# Patient Record
Sex: Male | Born: 1940 | Race: White | Hispanic: No | Marital: Married | Smoking: Former smoker
Health system: Southern US, Community
[De-identification: ages and names within clinical notes are randomized; demographics above are authoritative.]

## PROBLEM LIST (undated history)

## (undated) ENCOUNTER — Emergency Department (HOSPITAL_COMMUNITY): Payer: Medicare HMO | Source: Home / Self Care

## (undated) DIAGNOSIS — K567 Ileus, unspecified: Secondary | ICD-10-CM

## (undated) DIAGNOSIS — Z8719 Personal history of other diseases of the digestive system: Secondary | ICD-10-CM

## (undated) DIAGNOSIS — E039 Hypothyroidism, unspecified: Secondary | ICD-10-CM

## (undated) DIAGNOSIS — K859 Acute pancreatitis without necrosis or infection, unspecified: Secondary | ICD-10-CM

## (undated) DIAGNOSIS — R55 Syncope and collapse: Secondary | ICD-10-CM

## (undated) DIAGNOSIS — C801 Malignant (primary) neoplasm, unspecified: Secondary | ICD-10-CM

## (undated) DIAGNOSIS — R578 Other shock: Secondary | ICD-10-CM

## (undated) DIAGNOSIS — D122 Benign neoplasm of ascending colon: Secondary | ICD-10-CM

## (undated) DIAGNOSIS — G4733 Obstructive sleep apnea (adult) (pediatric): Secondary | ICD-10-CM

## (undated) DIAGNOSIS — K219 Gastro-esophageal reflux disease without esophagitis: Secondary | ICD-10-CM

## (undated) DIAGNOSIS — K921 Melena: Secondary | ICD-10-CM

## (undated) DIAGNOSIS — L719 Rosacea, unspecified: Secondary | ICD-10-CM

## (undated) DIAGNOSIS — I6529 Occlusion and stenosis of unspecified carotid artery: Secondary | ICD-10-CM

## (undated) DIAGNOSIS — I10 Essential (primary) hypertension: Secondary | ICD-10-CM

## (undated) DIAGNOSIS — Z8619 Personal history of other infectious and parasitic diseases: Secondary | ICD-10-CM

## (undated) DIAGNOSIS — Z87442 Personal history of urinary calculi: Secondary | ICD-10-CM

## (undated) DIAGNOSIS — I6523 Occlusion and stenosis of bilateral carotid arteries: Secondary | ICD-10-CM

## (undated) DIAGNOSIS — E785 Hyperlipidemia, unspecified: Secondary | ICD-10-CM

## (undated) DIAGNOSIS — T8859XA Other complications of anesthesia, initial encounter: Secondary | ICD-10-CM

## (undated) DIAGNOSIS — D126 Benign neoplasm of colon, unspecified: Secondary | ICD-10-CM

## (undated) DIAGNOSIS — T4145XA Adverse effect of unspecified anesthetic, initial encounter: Secondary | ICD-10-CM

## (undated) DIAGNOSIS — Z9889 Other specified postprocedural states: Secondary | ICD-10-CM

## (undated) DIAGNOSIS — M1990 Unspecified osteoarthritis, unspecified site: Secondary | ICD-10-CM

## (undated) DIAGNOSIS — J449 Chronic obstructive pulmonary disease, unspecified: Secondary | ICD-10-CM

## (undated) DIAGNOSIS — R112 Nausea with vomiting, unspecified: Secondary | ICD-10-CM

## (undated) DIAGNOSIS — H35073 Retinal telangiectasis, bilateral: Secondary | ICD-10-CM

## (undated) HISTORY — DX: Obstructive sleep apnea (adult) (pediatric): G47.33

## (undated) HISTORY — DX: Occlusion and stenosis of unspecified carotid artery: I65.29

## (undated) HISTORY — DX: Melena: K92.1

## (undated) HISTORY — DX: Rosacea, unspecified: L71.9

## (undated) HISTORY — DX: Occlusion and stenosis of bilateral carotid arteries: I65.23

## (undated) HISTORY — DX: Hyperlipidemia, unspecified: E78.5

## (undated) HISTORY — PX: OTHER SURGICAL HISTORY: SHX169

## (undated) HISTORY — PX: EYE SURGERY: SHX253

## (undated) HISTORY — DX: Gastro-esophageal reflux disease without esophagitis: K21.9

## (undated) HISTORY — DX: Acute pancreatitis without necrosis or infection, unspecified: K85.90

## (undated) HISTORY — DX: Personal history of other infectious and parasitic diseases: Z86.19

## (undated) HISTORY — DX: Benign neoplasm of colon, unspecified: D12.6

## (undated) HISTORY — DX: Ileus, unspecified: K56.7

## (undated) HISTORY — PX: LITHOTRIPSY: SUR834

## (undated) HISTORY — DX: Benign neoplasm of ascending colon: D12.2

## (undated) HISTORY — DX: Personal history of other diseases of the digestive system: Z87.19

## (undated) HISTORY — DX: Syncope and collapse: R55

## (undated) HISTORY — DX: Unspecified osteoarthritis, unspecified site: M19.90

## (undated) HISTORY — DX: Other shock: R57.8

## (undated) HISTORY — PX: TONSILLECTOMY: SUR1361

## (undated) HISTORY — PX: SKIN CANCER DESTRUCTION: SHX778

## (undated) HISTORY — PX: REFRACTIVE SURGERY: SHX103

## (undated) HISTORY — DX: Retinal telangiectasis, bilateral: H35.073

## (undated) HISTORY — DX: Essential (primary) hypertension: I10

## (undated) HISTORY — PX: ESOPHAGEAL DILATION: SHX303

## (undated) HISTORY — PX: TOTAL HIP ARTHROPLASTY: SHX124

---

## 1973-09-11 HISTORY — PX: NASAL SEPTUM SURGERY: SHX37

## 1989-09-11 HISTORY — PX: URETHRAL DILATION: SUR417

## 1993-09-11 HISTORY — PX: KNEE SURGERY: SHX244

## 1993-12-21 HISTORY — PX: HIATAL HERNIA REPAIR: SHX195

## 1999-09-12 DIAGNOSIS — R69 Illness, unspecified: Secondary | ICD-10-CM

## 1999-09-12 HISTORY — DX: Illness, unspecified: R69

## 2000-07-24 ENCOUNTER — Encounter: Payer: Self-pay | Admitting: Specialist

## 2000-07-30 ENCOUNTER — Inpatient Hospital Stay (HOSPITAL_COMMUNITY): Admission: RE | Admit: 2000-07-30 | Discharge: 2000-08-04 | Payer: Self-pay | Admitting: Specialist

## 2000-08-02 ENCOUNTER — Encounter: Payer: Self-pay | Admitting: Orthopedic Surgery

## 2001-05-07 ENCOUNTER — Inpatient Hospital Stay (HOSPITAL_COMMUNITY): Admission: EM | Admit: 2001-05-07 | Discharge: 2001-05-08 | Payer: Self-pay | Admitting: Emergency Medicine

## 2001-05-07 HISTORY — PX: BROW LIFT: SHX178

## 2001-05-08 ENCOUNTER — Encounter: Payer: Self-pay | Admitting: *Deleted

## 2001-06-13 ENCOUNTER — Encounter: Admission: RE | Admit: 2001-06-13 | Discharge: 2001-06-13 | Payer: Self-pay | Admitting: *Deleted

## 2001-06-13 ENCOUNTER — Encounter: Payer: Self-pay | Admitting: *Deleted

## 2001-10-29 ENCOUNTER — Encounter: Payer: Self-pay | Admitting: *Deleted

## 2001-10-29 ENCOUNTER — Encounter: Admission: RE | Admit: 2001-10-29 | Discharge: 2001-10-29 | Payer: Self-pay | Admitting: *Deleted

## 2001-10-31 ENCOUNTER — Encounter: Payer: Self-pay | Admitting: *Deleted

## 2001-10-31 ENCOUNTER — Ambulatory Visit (HOSPITAL_BASED_OUTPATIENT_CLINIC_OR_DEPARTMENT_OTHER): Admission: RE | Admit: 2001-10-31 | Discharge: 2001-10-31 | Payer: Self-pay | Admitting: *Deleted

## 2002-01-09 ENCOUNTER — Encounter: Payer: Self-pay | Admitting: *Deleted

## 2002-01-09 ENCOUNTER — Encounter: Admission: RE | Admit: 2002-01-09 | Discharge: 2002-01-09 | Payer: Self-pay | Admitting: *Deleted

## 2003-05-04 ENCOUNTER — Ambulatory Visit (HOSPITAL_COMMUNITY): Admission: RE | Admit: 2003-05-04 | Discharge: 2003-05-04 | Payer: Self-pay | Admitting: Gastroenterology

## 2003-11-04 ENCOUNTER — Emergency Department (HOSPITAL_COMMUNITY): Admission: EM | Admit: 2003-11-04 | Discharge: 2003-11-04 | Payer: Self-pay | Admitting: Emergency Medicine

## 2004-05-25 ENCOUNTER — Encounter: Admission: RE | Admit: 2004-05-25 | Discharge: 2004-05-25 | Payer: Self-pay | Admitting: General Surgery

## 2004-05-27 ENCOUNTER — Ambulatory Visit (HOSPITAL_COMMUNITY): Admission: RE | Admit: 2004-05-27 | Discharge: 2004-05-27 | Payer: Self-pay | Admitting: General Surgery

## 2004-05-27 ENCOUNTER — Ambulatory Visit (HOSPITAL_BASED_OUTPATIENT_CLINIC_OR_DEPARTMENT_OTHER): Admission: RE | Admit: 2004-05-27 | Discharge: 2004-05-27 | Payer: Self-pay | Admitting: General Surgery

## 2004-07-25 ENCOUNTER — Inpatient Hospital Stay (HOSPITAL_COMMUNITY): Admission: RE | Admit: 2004-07-25 | Discharge: 2004-07-29 | Payer: Self-pay | Admitting: Specialist

## 2004-07-25 HISTORY — PX: JOINT REPLACEMENT: SHX530

## 2004-08-26 ENCOUNTER — Emergency Department (HOSPITAL_COMMUNITY): Admission: EM | Admit: 2004-08-26 | Discharge: 2004-08-26 | Payer: Self-pay | Admitting: Emergency Medicine

## 2004-12-02 ENCOUNTER — Ambulatory Visit: Payer: Self-pay

## 2004-12-29 ENCOUNTER — Ambulatory Visit: Payer: Self-pay | Admitting: Cardiovascular Disease

## 2005-01-10 ENCOUNTER — Ambulatory Visit: Payer: Self-pay

## 2005-01-10 ENCOUNTER — Ambulatory Visit: Payer: Self-pay | Admitting: Internal Medicine

## 2005-01-26 ENCOUNTER — Ambulatory Visit: Payer: Self-pay | Admitting: Cardiovascular Disease

## 2005-03-06 ENCOUNTER — Ambulatory Visit: Payer: Self-pay | Admitting: Cardiovascular Disease

## 2005-06-08 ENCOUNTER — Ambulatory Visit: Payer: Self-pay | Admitting: Cardiovascular Disease

## 2005-11-24 ENCOUNTER — Ambulatory Visit: Payer: Self-pay | Admitting: Cardiovascular Disease

## 2006-08-01 ENCOUNTER — Inpatient Hospital Stay (HOSPITAL_COMMUNITY): Admission: EM | Admit: 2006-08-01 | Discharge: 2006-08-04 | Payer: Self-pay | Admitting: Emergency Medicine

## 2006-09-20 ENCOUNTER — Encounter (INDEPENDENT_AMBULATORY_CARE_PROVIDER_SITE_OTHER): Payer: Self-pay | Admitting: Specialist

## 2006-09-20 HISTORY — PX: LAPAROSCOPIC CHOLECYSTECTOMY: SUR755

## 2006-09-21 ENCOUNTER — Inpatient Hospital Stay (HOSPITAL_COMMUNITY): Admission: RE | Admit: 2006-09-21 | Discharge: 2006-09-26 | Payer: Self-pay | Admitting: General Surgery

## 2006-11-15 ENCOUNTER — Ambulatory Visit: Payer: Self-pay | Admitting: Cardiovascular Disease

## 2006-11-23 ENCOUNTER — Ambulatory Visit: Payer: Self-pay

## 2007-05-21 ENCOUNTER — Ambulatory Visit: Payer: Self-pay | Admitting: Cardiovascular Disease

## 2007-12-01 ENCOUNTER — Encounter: Admission: RE | Admit: 2007-12-01 | Discharge: 2007-12-01 | Payer: Self-pay | Admitting: Family Medicine

## 2008-03-17 ENCOUNTER — Encounter: Admission: RE | Admit: 2008-03-17 | Discharge: 2008-03-17 | Payer: Self-pay | Admitting: Family Medicine

## 2008-03-27 ENCOUNTER — Ambulatory Visit: Payer: Self-pay

## 2008-03-27 ENCOUNTER — Encounter (INDEPENDENT_AMBULATORY_CARE_PROVIDER_SITE_OTHER): Payer: Self-pay | Admitting: Family Medicine

## 2008-05-13 ENCOUNTER — Ambulatory Visit: Payer: Self-pay | Admitting: Cardiovascular Disease

## 2008-08-14 ENCOUNTER — Encounter: Admission: RE | Admit: 2008-08-14 | Discharge: 2008-08-14 | Payer: Self-pay | Admitting: Family Medicine

## 2009-02-09 ENCOUNTER — Encounter: Payer: Self-pay | Admitting: Cardiovascular Disease

## 2009-03-26 ENCOUNTER — Encounter: Admission: RE | Admit: 2009-03-26 | Discharge: 2009-03-26 | Payer: Self-pay | Admitting: Specialist

## 2009-05-13 DIAGNOSIS — L719 Rosacea, unspecified: Secondary | ICD-10-CM

## 2009-05-13 DIAGNOSIS — E7800 Pure hypercholesterolemia, unspecified: Secondary | ICD-10-CM | POA: Insufficient documentation

## 2009-05-13 DIAGNOSIS — R079 Chest pain, unspecified: Secondary | ICD-10-CM | POA: Insufficient documentation

## 2009-05-13 DIAGNOSIS — K449 Diaphragmatic hernia without obstruction or gangrene: Secondary | ICD-10-CM

## 2009-05-13 DIAGNOSIS — E039 Hypothyroidism, unspecified: Secondary | ICD-10-CM

## 2009-05-13 DIAGNOSIS — I10 Essential (primary) hypertension: Secondary | ICD-10-CM | POA: Insufficient documentation

## 2009-05-13 DIAGNOSIS — M109 Gout, unspecified: Secondary | ICD-10-CM

## 2009-05-13 DIAGNOSIS — K560 Paralytic ileus: Secondary | ICD-10-CM

## 2009-05-13 DIAGNOSIS — E785 Hyperlipidemia, unspecified: Secondary | ICD-10-CM

## 2009-05-13 DIAGNOSIS — N200 Calculus of kidney: Secondary | ICD-10-CM

## 2009-05-13 DIAGNOSIS — K4090 Unilateral inguinal hernia, without obstruction or gangrene, not specified as recurrent: Secondary | ICD-10-CM

## 2009-05-13 DIAGNOSIS — K859 Acute pancreatitis without necrosis or infection, unspecified: Secondary | ICD-10-CM | POA: Insufficient documentation

## 2009-05-13 HISTORY — DX: Gout, unspecified: M10.9

## 2009-05-13 HISTORY — DX: Essential (primary) hypertension: I10

## 2009-05-13 HISTORY — DX: Diaphragmatic hernia without obstruction or gangrene: K44.9

## 2009-05-13 HISTORY — DX: Calculus of kidney: N20.0

## 2009-05-13 HISTORY — DX: Unilateral inguinal hernia, without obstruction or gangrene, not specified as recurrent: K40.90

## 2009-05-18 ENCOUNTER — Ambulatory Visit: Payer: Self-pay | Admitting: Cardiovascular Disease

## 2009-12-17 DIAGNOSIS — D649 Anemia, unspecified: Secondary | ICD-10-CM | POA: Insufficient documentation

## 2009-12-17 HISTORY — DX: Anemia, unspecified: D64.9

## 2010-05-25 ENCOUNTER — Encounter: Payer: Self-pay | Admitting: Cardiovascular Disease

## 2010-06-13 DIAGNOSIS — M19049 Primary osteoarthritis, unspecified hand: Secondary | ICD-10-CM

## 2010-06-13 HISTORY — DX: Primary osteoarthritis, unspecified hand: M19.049

## 2010-06-15 ENCOUNTER — Encounter: Admission: RE | Admit: 2010-06-15 | Discharge: 2010-06-15 | Payer: Self-pay | Admitting: Family Medicine

## 2010-06-16 ENCOUNTER — Ambulatory Visit: Payer: Self-pay | Admitting: Cardiovascular Disease

## 2010-07-13 HISTORY — PX: JOINT REPLACEMENT: SHX530

## 2010-07-25 DIAGNOSIS — M1610 Unilateral primary osteoarthritis, unspecified hip: Secondary | ICD-10-CM

## 2010-07-25 HISTORY — DX: Unilateral primary osteoarthritis, unspecified hip: M16.10

## 2010-10-11 NOTE — Letter (Signed)
Summary: Self-Recorded Medical Hx  Self-Recorded Medical Hx   Imported By: Marylou Mccoy 06/29/2010 15:38:05  _____________________________________________________________________  External Attachment:    Type:   Image     Comment:   External Document

## 2010-10-11 NOTE — Assessment & Plan Note (Signed)
Summary: yearly/sl   CC:  sob.  History of Present Illness: [name] Pratt returns today for F/U of SSCP, HTN, and increased lipids.  He has no documented CAD with a normal myovue 3.14.2008.  EF 62%.  Echo 03/30/2008 done for dyspnea showed EF 60% with AV sclerosis and mild LAE.  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   He has been compliant with his BP meds and only takes gemfibrizol for triglycerides.  His labs are followed at [facility] r.  He has significant arthritis and sees Dr. Hardie Lora for hip and back issues.  These are relieved by Aleve.    Current Problems (verified): 1)  Hyperlipidemia  (ICD-272.4) 2)  Chest Pain  (ICD-786.50) 3)  Hypertension  (ICD-401.9) 4)  Nephrolithiasis  (ICD-592.0) 5)  Hiatal Hernia  (ICD-553.3) 6)  Ileus  (ICD-560.1) 7)  Inguinal Hernia  (ICD-550.90) 8)  Pancreatitis  (ICD-577.0) 9)  Hypothyroidism  (ICD-244.9) 10)  Hypercholesterolemia  (ICD-272.0) 11)  Rosacea  (ICD-695.3) 12)  Gout  (ICD-274.9)  Current Medications (verified): 1)  Allopurinol 100 Mg Tabs (Allopurinol) .[name].. 1 Tab By Mouth Once Daily 2)  Allopurinol 300 Mg Tabs (Allopurinol) .[name].. 1 Tab By Mouth Once Daily 3)  Amlodipine Besylate 10 Mg Tabs (Amlodipine Besylate) .... Take One Tablet By Mouth Daily 4)  Cephalexin 500 Mg Caps (Cephalexin) .... Prior Dental Visit 5)  Colchicine 0.6 Mg Tabs (Colchicine) .... As Needed 6)  Cozaar 100 Mg Tabs (Losartan Potassium) .... Take One Tablet By Mouth Daily 7)  Doxycycline Hyclate 100 Mg Solr (Doxycycline Hyclate) .... As Needed 8)  Gemfibrozil 600 Mg Tabs (Gemfibrozil) .[name].. 1 Tab By Mouth Two Times A Day 9)  Methyldopa 250 Mg Tabs (Methyldopa) .... Two Times A Day 10)  Metronidazole 0.75 % Crea (Metronidazole) .... As Needed 11)  Restasis 0.05 % Emul (Cyclosporine) .... As Directed 12)  Simvastatin 20 Mg Tabs (Simvastatin) .... Take One Tablet By Mouth Daily At Bedtime 13)  Hydrocodone-Acetaminophen 5-500 Mg Tabs (Hydrocodone-Acetaminophen) .... As Needed 14)   Ventolin Hfa 108 (90 Base) Mcg/act Aers (Albuterol Sulfate) .... As Needed 15)  Multivitamins   Tabs (Multiple Vitamin) .[name].. 1 Tab By Mouth Once Daily 16)  Ferrous Sulfate 325 (65 Fe) Mg  Tabs (Ferrous Sulfate) .[name].. 1 Tab By Mouth Once Daily 17)  Ra Omeprazole 20 Mg Tbec (Omeprazole) .[name].. 1 Tab By Mouth Once Daily  Allergies (verified): No Known Drug Allergies  Past History:  Past Medical History: Last updated: 05/13/2009 Current Problems:  HYPERLIPIDEMIA (ICD-272.4) CHEST PAIN (ICD-786.50) atypical with normal myovue 11/2006 HYPERTENSION (ICD-401.9) NEPHROLITHIASIS (ICD-592.0) HIATAL HERNIA (ICD-553.3) ILEUS (ICD-560.1) INGUINAL HERNIA (ICD-550.90) PANCREATITIS (ICD-577.0) HYPOTHYROIDISM (ICD-244.9) HYPERCHOLESTEROLEMIA (ICD-272.0) ROSACEA (ICD-695.3) GOUT (ICD-274.9) gallstone pancreatitis and ileus and prolonged hospitalization.   Cholecystitis, currently stable.  Past Surgical History: Last updated: 05/13/2009 Laparoscopic cholecystectomy with intraoperative cholangiogram and right inguinal herniorrhaphy with mesh September 20, 2006.  hiatal hernia surgery 15 years ago.  knee surgeries. Hemorrhoid surgery in the past. esophageal dilation for some reason several times performed by  Dr. Sharrell Ku.  This was done in 1993 and 1994.  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  I am not entirely clear why he had that done lithotripsy in 2003, the last one.  Family History: Last updated: 05/13/2009  Hypertension, cancer, and osteoarthritis.  Social History: Last updated: 05/18/2009  The patient is married.  He is a IT sales professional.  He does not  smoke and drink. 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  Review of Systems       Denies fever, malais, weight loss, blurry vision, decreased visual acuity, cough, sputum, SOB, hemoptysis, pleuritic pain, palpitaitons, heartburn, abdominal pain, melena, lower extremity edema,  claudication, or rash. CPAP recently changed and had sleep study  Vital Signs:  Patient profile:   70 year old male Height:      67 inches Weight:      174 pounds BMI:     27.35 Pulse rate:   59 / minute Resp:     14 per minute BP sitting:   148 / 76  (left arm)  Vitals Entered By: Kem Parkinson (June 16, 2010 2:46 PM)  Physical Exam  General:  Affect appropriate Healthy:  appears stated age HEENT: normal Neck supple with no adenopathy JVP normal no bruits no thyromegaly Lungs clear with no wheezing and good diaphragmatic motion Heart:  S1/S2 no murmur,rub, gallop or click PMI normal Abdomen: benighn, BS positve, no tenderness, no AAA no bruit.  No HSM or HJR Distal pulses intact with no bruits No edema Neuro non-focal Skin warm and dry    Impression & Recommendations:  Problem # 1:  CHEST TIGHTNESS-PRESSURE-OTHER ([id number]) Relsolved with normal ECG and prev. negative myovue  Problem # 2:  HYPERLIPIDEMIA (ICD-272.4) Assessment: Comment Only  Continue 2 drug Rx.  Labs per primary His updated medication list for this problem includes:    Gemfibrozil 600 Mg Tabs (Gemfibrozil) .[name]... 1 tab by mouth two times a day    Simvastatin 20 Mg Tabs (Simvastatin) .[name]... Take one tablet by mouth daily at bedtime  His updated medication list for this problem includes:    Gemfibrozil 600 Mg Tabs (Gemfibrozil) .[name]... 1 tab by mouth two times a day    Simvastatin 20 Mg Tabs (Simvastatin) .[name]... Take one tablet by mouth daily at bedtime  Problem # 3:  HYPERTENSION (ICD-401.9) Continue 3 drug Rx.  Low sodium diet His updated medication list for this problem includes:    Amlodipine Besylate 10 Mg Tabs (Amlodipine besylate) .[name]... Take one tablet by mouth daily    Cozaar 100 Mg Tabs (Losartan potassium) .[name]... Take one tablet by mouth daily    Methyldopa 250 Mg Tabs (Methyldopa) .[name]..[name] Two times a day  Other Orders: EKG w/ Interpretation (93000)  Patient Instructions: 1)   Your physician recommends that you schedule a follow-up appointment in: YEAR WITH DR [name] 2)  Your physician recommends that you continue on your current medications as directed. Please refer to the Current Medication list given to you today.   Echocardiogram Report  Procedure date:  06/16/2010  Findings:      sinus brady 59 Normal ECG

## 2010-12-27 DIAGNOSIS — E785 Hyperlipidemia, unspecified: Secondary | ICD-10-CM

## 2010-12-27 DIAGNOSIS — E1169 Type 2 diabetes mellitus with other specified complication: Secondary | ICD-10-CM | POA: Insufficient documentation

## 2010-12-27 HISTORY — DX: Hyperlipidemia, unspecified: E78.5

## 2011-01-24 NOTE — Assessment & Plan Note (Signed)
[facility]                            CARDIOLOGY OFFICE NOTE   TIN, ENGRAM                  MRN:          [id number]  DATE:05/21/2007                            DOB:          [birth year]    [name] returns today for followup.  I primarily follow him for  hypercholesterolemia and hypertension.  His biggest issue this past year  has been gallstone pancreatitis and ileus and prolonged hospitalization.  He has not documented coronary artery disease.  He gets occasional  atypical chest pain.  The pain is sharp.  It is intermittent.  It does  not last but a second or two.  It is not necessarily exertional.  It is  at a stable pattern over the last 3 years.  His last Myoview study was  done on November 23, 2006, and was nonischemic.   In regards to his risk factors, his blood pressure seems to be  borderline controlled.  However, he appears to have a white coat  syndrome.  At home he says his blood pressure fluctuates but in general  runs under 140 systolic.  He tends to watch the salt in his diet, does  not add it but does not seek out low sodium food.   The patient had an echocardiogram done last year which showed good LV  function and no LVH.   His review of systems otherwise negative.   MEDICATIONS:  Numerous and listed in the chart.  1. For his blood pressure they include:      a.     Aldomet 250 b.i.d.      b.     Cozaar 100 mg a day.      c.     Lopressor 25 b.i.d.  His other med list needs to be checked.  2. He does have gout and is on colchicine as needed.  3. His cholesterol is being controlled with:      a.     Gemfibrozil 1200 mg a day.      b.     Simvastatin 10 mg a day.  4. He is taking baby aspirin.  5. He is on Synthroid 125 mcg a day.  6. Felodipine 10 mg a day.   PHYSICAL EXAMINATION:  VITAL SIGNS:  Blood pressure 145/85, pulse is 70  and regular, respiratory rate is 14.  He is afebrile.  Weight is stable  at  175.  GENERAL:  Affect is appropriate.  HEENT:  Normal.  NECK:  Carotids normal without bruit.  There is no lymphadenopathy, no  thyromegaly, no JVP elevation.  LUNGS:  Clear with good diaphragmatic motion.  No wheezing.  HEART:  There is an S1 S2 with normal heart sounds.  There is no S4  gallop.  PMI is normal.  ABDOMEN:  Benign.  Bowel sounds positive. There is no tenderness, no  hepatosplenomegaly, no hepatojugular reflux.  He is status post  laparoscopic cholecystectomy and right hernia repair.  He has a ventral  hernia which is not tender and easily reducible.  Femorals are plus 3  bilaterally without bruit.  EXTREMITIES:  PTs  are plus 3 and there is no lower extremity edema.  NEUROLOGIC:  Nonfocal.  There is no muscular weakness.  SKIN:  Warm and dry.   EKG is normal with a sinus rhythm, rate is 62, no LVH.   IMPRESSION:  1. Atypical chest pain, normal Myoview in March.  Follow.  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  Treat with antiinflammatories.  2. Hypertension, borderline control.  Continue a low salt diet.      Continue current regimen of felodipine, Cozaar, and Aldomet.  The      patient will continue to monitor his blood pressure at home.  It is      reassuring that his echo and ECG show no left ventricular      hypertrophy.  There is a bit of a white coat syndrome here.  3. Hyperlipidemia.  No recent lipid and liver profile.  I asked him to      follow up with Dr. Duanne Guess in regards to this.  He will continue his      low dose Statin.  He is not having any significant myalgias.   Overall, I think [name] Pratt is doing fine.  I will see him back in a  year.     [name]. [name], MD, [facility]  Electronically Signed    PCN/MedQ  DD: 05/21/2007  DT: 05/21/2007  Job #: [id number]

## 2011-01-24 NOTE — Assessment & Plan Note (Signed)
[facility]                            CARDIOLOGY OFFICE NOTE   GILLES, TRIMPE                  MRN:          [id number]  DATE:05/13/2008                            DOB:          11-27-1940    Brittany returns today for followup.  I have seen him for hypertension,  hypercholesterolemia, and atypical chest pain.  He has been doing well.  He continues to have some musculoskeletal-type pain.  He had a bit of  pleuritic component.  Apparently this was worked up by Dr. Duanne Guess with  PFTs and a chest x-ray.  Michelle said things looked fine.  His pain is  nonexertional.  It is intermittent.  It happens maybe 2-3 times per  month.  He has had normal stress test in the past and I do not think he  needs further workup.   His dyspnea seems to be resolved.  I think a bit of it is anxiety as his  primary complaint is an inability to take a deep breath at times.  Risk  factors were well maintained.  He has been compliant with his blood  pressure and cholesterol pills.  I do not have his latest cholesterol  per Dr. Duanne Guess, but Gelene Mink said his LDLs were under 100 on statin  therapy.   His review of systems otherwise negative.   PHYSICAL EXAMINATION:  VITAL SIGNS:  His exam is remarkable for a weight  of 174, which is down about 10 pounds, blood pressure 146/66, pulse 71  and regular, and respiratory rate 14, afebrile.  HEENT:  Unremarkable.  NECK:  Carotids normal without bruit.  No lymphadenopathy.  No  thyromegaly or JVP elevation.  LUNGS:  Clear.  Good diaphragmatic motion.  No wheezing.  CARDIAC:  S1 and S2 with a soft systolic murmur.  PMI normal.  ABDOMEN:  Benign.  Bowel sounds positive.  No AAA.  No tenderness.  No  bruit.  No hepatosplenomegaly.  No hepatojugular reflux.  No tenderness.  EXTREMITIES:  Distal pulses are intact.  No edema.  NEURO:  Nonfocal.  SKIN:  Warm and dry.  MUSCULOSKELETAL:  No muscular weakness.   His medications  include allopurinol 100 in the morning, amlodipine 10 mg  a day, colchicine 0.6 mg a day, Cozaar 100 mg a day, gemfibrozil 600 a  day, Synthroid 125 mcg a day, methyldopa 250 b.i.d., and simvastatin 20  a day.   IMPRESSION:  1. Hypertension, currently well controlled.  Continue low-salt diet      and angiotensin II receptor blocker.  2. Chest pain atypical.  Normal stress test in the past.  No need for      further workup, p.r.n. Aleve.  3. Hypercholesterolemia.  Follow up with Dr. Duanne Guess importance of      having LFTs checked in yearly basis discussed.  No evidence of side      effects or myalgias.  Continue simvastatin 20 a day.  4. Hypothyroidism.  TSH per Dr. Duanne Guess.  Continue current dose.  5. Recent dyspnea with PFTs and chest x-ray per Dr. Duanne Guess.  The      patient does  have a Ventolin HFA inhaler to use as needed.     [name]. [name], MD, [facility]  Electronically Signed    PCN/MedQ  DD: 05/13/2008  DT: 05/14/2008  Job #: [id number]

## 2011-01-27 NOTE — Consult Note (Signed)
NAME:  [name] Pratt, [name] Pratt NO.:  [id number]   MEDICAL RECORD NO.:  [id number]                   PATIENT TYPE:  EMS   LOCATION:  [facility]                                 FACILITY:  [facility]   PHYSICIAN:  Lonia Blood, M.D.            DATE OF BIRTH:  [birth year]   DATE OF CONSULTATION:  11/04/2003  DATE OF DISCHARGE:  11/04/2003                                   CONSULTATION   EMERGENCY ROOM CONSULTATION NOTE   PHYSICIAN REQUESTING CONSULTATION:  Lorre Nick, M.D.   REASON FOR CONSULTATION:  Syncopal spell.   HISTORY OF PRESENT ILLNESS:  [name] Pratt is a 70 year old  gentleman with a longstanding history of vasovagal syncope.  He is able to  report 2-3 distinct episodes of vasovagal syncope including 1 episode in  2002 during which time he was admitted to the hospital and underwent a full  cardiac evaluation which was, in fact negative.  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 He  underwent injection of the knee.  Shortly after his injection he was cleared  for discharge from the orthopedics office.  He proceeded to the front desk  where he became lightheaded and then was observed to slump to the floor.  He  quickly awoke, but did not recall the event himself.  There was no chest  pain or shortness of breath at all.  Since that time he has had no  recurrence of his symptoms.  He did present to his primary care physician's  office after this episode.  There he was found to be orthostatic and  bradycardic.  He was, therefore, sent to the emergency room for evaluation.   In the emergency room the patient was given IV fluid and monitored on  telemetry.  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  He  has had no recurrent episodes during this stay in the emergency room.   At this time he is completely  asymptomatic.  He is able to stand at the  bedside with no complaints.  He is not dizzy.  He has no chest pain or  shortness of breath.   PAST MEDICAL HISTORY:  1. History of recurrent vasovagal episodes--workup in August 2002 including     negative Cardiolite and negative rule out for MI.  2. Nephrolithiasis status post lithotripsy x3.  3. Hypertension.  4. Status post knee replacement 2001.  5. LASIK eye surgery bilaterally 2001.  6. Colonoscopy August 2004 revealing internal hemorrhoids and     diverticulosis.  7. Hyperlipidemia.  8. Hypothyroidism.  9. Status post bilateral blepharoplasty per Dr. Benna Dunks in August 2002.  10.      Gastroesophageal reflux disease with hiatal hernia status post     Nissen fundoplication.  11.      Status post TURP.  12.  Status post hemorrhoidectomy.  13.      History of gout.   MEDICATIONS:  1. Glucosamine chondroitin 2 daily.  2. Atenolol 50 mg daily.  3. Multivitamin daily.  4. Allopurinol 300 mg daily.  5. Colchicine 0.6 mg p.o. daily.  6. Felodipine SA 10 mg daily.  7. Gemfibrozil 60 mg b.i.d.  8. Synthroid 0.137 mg daily.  9. Lisinopril 20 mg b.i.d.  10.      Simvastatin 10 mg daily.  11.      Terazosin 5 mg q.h.s.  12.      Enteric coated aspirin 81 mg daily.   ALLERGIES:  REGLAN and questionably XYLOCAINE.   FAMILY HISTORY:  Father died at 67 secondary to an accident.  Mom died at 54  secondary to cervical cancer.  Brother died at age 82 secondary to cancer of  unknown etiology.   SOCIAL HISTORY:  The patient quit smoking tobacco in 1969.  He occasionally  drinks alcohol.  He is a retired Company secretary. He is married with 1 child.   DATA REVIEW:  Electrolytes are unremarkable with the exception of mild  decreased sodium and mildly elevated BUN.  Myoglobin is mildly elevated at  266-229.  CKMB is normal.  Troponin I is normal.  EKG reveals sinus  bradycardia with a heart rate of 55 beats/minute. No acute ST or T wave  change.   Prolonged PR interval at 200 milliseconds which is consistent with  reports of old EKGs, but no acute changes are appreciable.   PHYSICAL EXAMINATION:  VITAL SIGNS:  Temperature 97.9, blood pressure at the  time of my evaluation is 115/65, sitting; and 111/62 standing with heart  rate of 72 and 70 respectively.  Respiratory rate is 28.  O2 saturation 97%  on room air.  HEENT:  Normocephalic, atraumatic.  Pupils equal round, and react to light  and accommodation.  External oblique muscles intact bilaterally.  EOMI.  Sclerae clear.  NECK:  No JVD.  No thyromegaly.  No carotid bruits.  LUNGS:  Clear to auscultation bilaterally without wheezes or rhonchi.  CARDIOVASCULAR:  Regular rate and rhythm without murmur, gallop, or rub with  normal S1 and S2.  ABDOMEN:  Nontender, nondistended.  Soft, bowel sounds present.  No  hepatosplenomegaly, no rebound, no ascites.  EXTREMITIES:  No clubbing, cyanosis, or edema of bilateral lower  extremities.  NEUROLOGIC:  Cranial nerves II-XII intact bilaterally.  5/5 strength  throughout.  Able to stand at the bedside with no dizziness or presyncopal  spells.   RECOMMENDATIONS:  My evaluation of Mr. Zahler is most consistent with the  recurrence of his known vasovagal syncope.  I believe that this was brought  about by the injection into his joint in his orthopedist's office today.  While the patient was, in fact, orthostatic, hypotensive and bradycardic in  initial evaluation; this has now resolved.  I feel that this is simple  resolution of his vasovagal spell.   My physical exam, at this time, is unremarkable.  Vital signs are stable and  he has no symptoms to suggest coronary issues.  His EKG is without acute  change.  His rapid cardiac enzymes in the emergency room are unremarkable.  He has no chest pain or severe shortness of breath. His symptoms are all entirely consistent with a vasovagal spell.  The patient has a wife, who  lives with him, and  who will be able to observe him at home.  I have  encouraged him to  discontinue his atenolol.  I have requested that he hold  his felodipine and terazosin for 3 days and then resume them as previously  taken.  I have informed him that he should follow up with his primary care  physician should he have  recurrent mild symptoms; and that he should return, immediately, to the  emergency room should he develop chest pain, fever, chills, nausea,  vomiting, or severe shortness of breath.  The patient understands my  evaluation and agrees with the decision for him to go home.                                               Lonia Blood, M.D.    JTM/MEDQ  D:  11/04/2003  T:  11/05/2003  Job:  [id number]

## 2011-01-27 NOTE — H&P (Signed)
[facility]  Patient:    [name] Pratt, [name] Pratt Visit Number: [id number] MRN: [id number]          Service Type: Attending:  Radene Knee., M.D. Dictated by:   Radene Knee., M.D. Adm. Date:  10/31/01                           History and Physical  INCOMPLETE  CONE NUMBER:  [id number]  REASON FOR ADMISSION:  This patient, age 70, is scheduled at [facility] for ESWL on 31 October 2000, with the chief complaint of enlarging stones in the left kidney.  HISTORY OF PRESENT ILLNESS:  This 70 year old male has been followed in our office since 1998 with renal calculi.  He had ESWL on the right on two occasions in 1998 with stenting, and finally good fragment passage was obtained.  The patient did reasonably well until December 2002 when he came in with pain on the right side.  Investigation did not reveal any stone on the right; however, he had enlarging calcifications in the left lower calyx when compared with previous x-rays.  He also had a history of E. coli urinary tract infection in October 2002 which cleared on treatment with antibiotics.  When seen in the office on 12 September 2001, his urinalysis was clear, and ultrasound revealed no hydronephrosis right or left.  He had calcifications in the lower left calyx, and these had been confirmed by x-ray to be enlarging.  He reported that he got up once a night, going every 2 to 3 hours.  He has not passed any blood, gravel, or stone.  He has had no further pain on the right side.  He was advised to have ESWL in view of the enlarging stone in the left kidney, and this is scheduled for 31 October 2000, at [facility].  ALLERGIES:  Reglan.  PRESENT MEDICATIONS: 1. Macrobid 100 mg daily for prophylaxis again infection. 2. Atenolol 50 mg daily. 3. Lisinopril 40 mg daily. 4. Dynacirc 5 mg daily. 5. Simvastatin 20 mg daily. 6. Gemfibrozil 600 mg daily. 7. Levothyroxine 1 daily. 8. Aspirin 1  daily.  PAST MEDICAL HISTORY AND PROBLEM LIST:  He has had hypertension since 1992. He had what sounds like a internal urethrotomy about 1992.  He had knee surgery in 1993 and a knee replacement in 3001.  He has laser eye surgery in 2001.  He had ESWL x 2 in 1998, and in 2002 he had some eyelid surgery at which time he did have some hypotension secondary to anesthesia.  FAMILY HISTORY:  The patients father died at age 11 of accidental electrocution.  His mother died of carcinoma of the cervix at age 65.  He had one brother who had cancer and died at 45.  He has a sister living and well at age 51 with no diabetes, stones, or bleeding problems in the family to his knowledge, but there is a family history of hypertension without renal disease.  SOCIAL HISTORY:  The patient uses alcohol only occasionally.  He quit smoking in 1969 Dictated by:   Radene Knee., M.D. Attending:  Radene Knee., M.D. DD:  09/17/01 TD:  09/17/01 Job: [id number]

## 2011-01-27 NOTE — H&P (Signed)
[facility]  Patient:    [name] Pratt, [name] Pratt                     MRN: [id number] Adm. Date:  07/30/00 Attending:  R. Valma Cava, M.D. Dictator:   Roderic Palau, P.A.                         History and Physical  CHIEF COMPLAINT:  Left knee pain.  HISTORY OF PRESENT ILLNESS:  Mr. Morefield is a 70 year old white male who has chronic progressive knee pain and dysfunction.  He has been seen for a number of years for conservative treatment of his knee and has utilized anti-inflammatory medications and received several corticosteroid injections to the left knee joint.  At this time his lifestyle is being somewhat limited secondary to his discomfort and he is having continual pain.  Examination has revealed marked varus deformity of the left knee.  He has palpable osteophytes of the left knee with range of motion being 5 to approximately 110 degrees. Plain x-rays of the left knee reveal varus malalignment with bone on bone contact and end-stage osteoarthritis.  Due to these significant findings as well as his continued symptoms of pain and dysfunction, it is felt he will benefit best from a left total knee replacement.  After long discussion with Dr. Thomasena Edis regarding the lifetime of the implant, complications including DVT infection and failure of the procedure to accomplish its intended goal, patient is now being admitted to undergo a left total knee replacement.  Prior to his admission he has been seen by Dr. Dara Hoyer, his usual family medical doctor, and has been found to have no contraindication for the planned surgery from a medical standpoint.  CURRENT MEDICATIONS: 1. Lotrel 5/20 one p.o. q.d. 2. Atenolol 50 mg one p.o. q.d. 3. Lipitor 10 mg one p.o. q.d. 4. Levothroid 150 mg one p.o. q.d. 5. Multivitamin daily.  ALLERGIES:  Patient gives a history of being allergic to [facility], which "freaks him out."  He also states that in the distant past he  has had an allergy to the "CAINES."  He states that XYLOCAINE caused him to have "shock."  PAST MEDICAL HISTORY:  Patient has: 1. Hypertension. 2. Hypothyroidism. 3. Hyperlipidemia. 4. History of renal calculi. 5. High frequency hearing loss.  PAST SURGICAL HISTORY: 1. Hemorrhoidectomy, 1961. 2. TURP several years ago. 3. Lithotripsy, March 1998. 4. Fundoplication, 1995. 5. Bilateral lasics surgery, September 2001. 6. Left knee arthroscopy, 1994.  SOCIAL HISTORY:  Patient is married and lives with his wife.  He has one son who is an adult and lives close by.  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  He has no intake of tobacco and no intake of alcohol.  FAMILY HISTORY:  Patients father died at age 84 from an electrocution accident.  Patients mother died at 62 of cervical cancer.  REVIEW OF SYSTEMS:  CNS:  Patient denied blurred vision, double vision, seizure disorder, headaches, or paralysis.  CARDIORESPIRATORY:  No chest pain, shortness of breath, cough, sputum production, or hemoptysis.  He does have occasional seasonal type allergies which cause him to have a stuffy nose and some sinus drainage.  GASTROINTESTINAL/GENITOURINARY:  No nausea, vomiting, diarrhea, or constipation.  No dysuria, hematuria, melena, or bloody stools. Patient does have history of hemorrhoids with no recent complications. MUSCULOSKELETAL:  As per history of present illness.  HEMATOLOGIC: Hematologically patient denies history of bleeding disorder, jaundice, hepatitis, anemia, or blood clots.  PHYSICAL EXAMINATION:  VITAL SIGNS:  Patient is afebrile, pulse 68 and regular, blood pressure 145/90.  GENERAL:  He is a well-developed, well-nourished white male, alert and oriented x 4, no acute distress.  HEENT:  Normocephalic, atraumatic.  Pupils equal, round, and reactive to light and accommodation.  Extraocular movements intact.  Nose  without drainage. Oropharynx without edema or erythema.  NECK:  Supple, no adenopathy or thyromegaly.  LUNGS:  Clear to auscultation.  HEART:  Heart has regular rate and rhythm.  No murmur heard.  ABDOMEN:  Soft, nontender.  Bowel sounds present.  GENITALIA/RECTAL/BREASTS:  Exam not performed, not pertinent to present illness.  EXTREMITIES:  Examination of the left knee as stated in history of present illness.  In the lower extremities, distal pulses are +2 bilaterally.  This is at dorsalis pedis and posterior tibialis bilaterally.  There is no cyanosis, clubbing, or edema of the lower extremities.  IMPRESSION: 1. End-stage osteoarthritis of the left knee. 2. Hypertension. 3. Hypothyroidism. 4. Hyperlipidemia. 5. Renal calculi. 6. High frequency hearing loss.  PLAN:  Patient will be admitted to the hospital to undergo a left total knee replacement on July 30, 2000 by Dr. Thomasena Edis.  As stated above he has been seen and examined by Dr. Arlyce Dice and felt to have no contraindication for the planned surgical procedure.  Preoperative labs and x-ray are pending. DD:  07/25/00 TD:  07/25/00 Job: [id number]

## 2011-01-27 NOTE — Discharge Summary (Signed)
NAME:  LADARRIAN, ASENCIO NO.:  [id number]   MEDICAL RECORD NO.:  [id number]          PATIENT TYPE:  INP   LOCATION:  0474                         FACILITY:  [facility]   PHYSICIAN:  Erasmo Leventhal, M.D.DATE OF BIRTH:  16-Jun-1941   DATE OF ADMISSION:  07/25/2004  DATE OF DISCHARGE:  07/29/2004                                 DISCHARGE SUMMARY   ADMISSION DIAGNOSIS:  End-stage osteoarthritis, right knee.   DISCHARGE DIAGNOSIS:  End-stage osteoarthritis, right knee.   OPERATION:  Total knee arthroplasty, right knee.   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  Due to continued pain in his right knee  and failure of conservative measures, he is now scheduled for total knee  arthroplasty of the right knee.  The surgery with its risks, benefits and  aftercare, were discussed in detail with the patient, questions invited and  answered, and surgery to go ahead as scheduled.   LABORATORY VALUES:  Admission CBC showed RBC slightly low at 4.02,  hemoglobin slightly low at 12.8, hematocrit slightly low at 37.7, platelets  high at 421.  His hemoglobin and hematocrit reached a low of 10.1 and 29.7  and was back to 11.8 and 34.5 at discharge.  White count was slightly  elevated at 10.7.  PT/PTT did not really bump during the hospitalization.  His PT/INR was normal on admission and was normal at discharge.  He was  started on Lovenox until his Coumadin reached therapeutic levels.  CMET  showed his glucose to be elevated throughout admission in the 160-170 range.  He had a slight hyponatremia at 132, which corrected by discharge, otherwise  labs normal.   HOSPITAL COURSE:  The patient tolerated the operative procedure well.  The  first postoperative day, vital signs were stable.  Hemoglobin and hematocrit  were stable.  He had  slight hyponatremia.  He tolerated his CPM well.   The second postoperative day, his vital signs were stable.  He is afebrile.  Hemoglobin and hematocrit were already bumping up.  He still was mildly  hyponatremic.  His IV was switched to an IV of normal saline.  He also had a  slight hypokalemia, and he was given 20 mEq of K-Dur x1.  His epidural was  removed.   On the third postoperative day, vital signs continued to be stable.  Hemoglobin and hematocrit were back up to 11.8 and 34.5.  Hyponatremia and  hypokalemia corrected.  His PCA was DC'd.  Lovenox protocol was started due  to him not bumping his PT/INR.   On the fourth postoperative day, he was feeling fine.  He was ambulating  well and doing great in therapy.  Vital signs were stable.  Lungs were  clear.  Calves were negative.  Negative Homans' signs were noted.  He was  subsequently discharged home for a followup in the office.  He was  discharged on 40 mg of Lovenox q.d. while he was on the Coumadin.  I  discussed  this with Elie Goody, PharmD, who felt that the 40 of Lovenox  once a day, since he was on Coumadin, would be acceptable treatment and  prophylaxis for DVT.  He is subsequently discharged home.  He will have q.d.  PT/INR's until his PT/INR is therapeutic, and then his Lovenox will be DC'd.   His discharge medications are Percocet, Coumadin, Lovenox 40 1 q.d., and  Robaxin.  Followup in the office in 10 days.   DISCHARGE INSTRUCTIONS:  He is to do a CPM, work at his home therapy, and  call if any problems or questions arise.      SJC/MEDQ  D:  08/17/2004  T:  08/17/2004  Job:  [id number]

## 2011-01-27 NOTE — Assessment & Plan Note (Signed)
[facility]                            CARDIOLOGY OFFICE NOTE   [name] Pratt [name] Pratt                  MRN:          [id number]  DATE:11/15/2006                            DOB:          12/11/1940    [name] is seen today in followup.  He had a very long  hospitalization in January for biliary pancreatitis, right inguinal  hernia, and post-op ileus.  His heart was stable at the time.  I have  seen him in the past, primarily for blood pressure control.  His blood  pressure has been borderline controlled.  However, needless to say, he  is not really keen on starting any new medications.   He is currently on:  1. Cozaar 100.  2. Aldomet 250 b.i.d.  3. Felodipine 10 mg a day.   He does watch his salt in his diet.  He does not drink.   He has had a workup for renal artery stenosis, which is negative.   Despite his risk factors and a positive family history of hypertension,  he has not had a stress test in over 10 years, and I told him I thought  it would be reasonable.  Please see his medication list, which is typed  and in the chart.  The patient has extensive records back from 1963.  I  reviewed 10 pages of these records.   EXAM:  Blood pressure 148/88.  Pulse is 70 and regular.  HEENT:  Normal.  LUNGS:  Clear.  There is an S1, S2 with an S4 gallop.  ABDOMEN:  Benign.  He seems to have healed well from his biliary  pancreatitis surgery and has the usual scars.  His bowel sounds are  positive.  There is no distension or hernia.  Distal pulses are intact.  There is no edema.  There are no renal  bruits.  NEURO:  Nonfocal.   IMPRESSION:  Hypertension, probable borderline control.  Coronary risk  factors, including a family history and hypertension.  Followup exercise  stress test to assess blood pressure response to exercise and rule out  coronary disease.  Continue 3-drug therapy in the future if he were to  need to have better control  of his blood pressure, I may switch him from  Cozaar to Hyzaar.  Otherwise, I will see him back in 6 months.     [name]. [name], MD, [facility]  Electronically Signed    PCN/MedQ  DD: 11/15/2006  DT: 11/15/2006  Job #: [id number]

## 2011-01-27 NOTE — Op Note (Signed)
[facility]  Patient:    [name] Pratt, [name] Pratt                    MRN: [id number] Proc. Date: 07/30/00 Attending:  R. Valma Cava, M.D.                           Operative Report  PREOPERATIVE DIAGNOSES:  Left knee end-stage osteoarthritis.  POSTOPERATIVE DIAGNOSES:  Left knee end-stage osteoarthritis.  PROCEDURE:  Left total knee arthroplasty.  SURGEON:  Valma Cava, M.D.  ASSISTANT:  French Ana Shuford, P.A.-C.  ANESTHESIA:  Spinal epidural.  ESTIMATED BLOOD LOSS:  Less than 50 cc.  DRAINS:  2 Hemovacs.  COMPLICATIONS:  None.  TOURNIQUET TIME:  1 hour and 47 minutes at 350 mmHg.  DISPOSITION:  PACU stable.  OPERATIVE IMPLANTS:  Posterior stabilized osteonics. All components cemented. Size #9 femur, size #11 tibia with a 10 mm polyethylene insert and 26 mm patella.  DESCRIPTION OF PROCEDURE:  The patient was counseled in the holding area, correct size identified, chart was reviewed. IV started, antibiotics were given. Taken to the operating room, placed in ______ position with the spinal epidural was administered. The patient in supine, a Foley catheter placed utilizing sterile technique by the OR circulating nurse. The left knee was examined, 5 degree flexion contracture, flexed to 110 degrees, elevated, prepped with duraprep and was draped in a sterile fashion. Esmarch was utilized to exsanguinate the lower extremity. A longitudinal incision was made through the skin and subcutaneous tissue. Medial and lateral soft tissue flaps were developed. A median parapatellar arthrotomy was performed. ______ soft tissue released, medial tibial patella was everted. End-stage arthritic, bone on bone contact, 2 out of 3 compartments. The cruciate ligaments were resected. Hemostasis obtained. Starting hole made distal to the femur, canal was irrigated, intermedullary rod was placed. We chose a 5 degree valgus cut and took a 10 mm cut off the distal  femur. The distal femur was found to be a size #9, rotation marks were made and cutting block was applied and the distal femur was cut appropriately. Medial and lateral menisci were removed, geniculates were coagulated. The ______ imminence was resected. The proximal tibia was found to be a size #11, a starting hole was made, a step reamer was utilized and again the canal was irrigated gently. The intermedullary rod was placed. We took a 10 mm cut based upon the lateral side of the 5 degree posterior slope, rotation was set and proximal tibia was cut appropriately. Osteophytes removed. Posteromedial, posterolateral femoral osteophytes removed delicately under direct vision. The femoral trochlea was prepared in standard fashion. At this point in time with a size #9 femur, size #11 tibia with a 10 mm polyethylene insert with excellent range of motion and soft tissue balance and alignment. Rotation marks made on the proximal tibia and the delta keel was performed in standard fashion. Attention directed to the patella and it was found to be a size #26 and it was reamed to a depth of 10 mm, locking holes were made and excess bone was removed. At this point in time, utilizing ______ with the ______ cement technique, all components were cemented into place. A size #11 tibia, size #9 femur, 26 mm polyethylene patella after the cement had cured. We then put in trials of 10 and 12 and the 10 mm thickness had the best range of motion and soft tissue balance. The knee was then flexed and  excess cement was removed and 10 mm polyethylene insert was placed into position. Lateral recess was performed with electrocautery. Two medium Hemovac drains were placed. ______ placed in the intercondylar notch region. The knee was irrigated with ______ solution during the closure. A sequential closure, layers were done with synovium Vicryl, arthrotomy Vicryls, subcu Vicryl and the skin closed with staples. The drain  was then hooked to suction. Sterile dressings applied and the tourniquet was deflated. He had normal pulses in the foot and ankle at the end of the case. There were no complications. He had excellent clinical alignment. The knee was well balanced, flexion extension, patella femoral tracking. ______ full extension. He was given another gram of Ancef intravenously, tourniquet deflated. There were no complications. Sponge and needle count were correct. He was then taken to the operating room and PACU in stable condition. DD:  07/30/00 TD:  07/31/00 Job: [id number]

## 2011-01-27 NOTE — Discharge Summary (Signed)
NAME:  NHAT, HEARNE NO.:  [id number]   MEDICAL RECORD NO.:  [id number]          PATIENT TYPE:  INP   LOCATION:  1604                         FACILITY:  [facility]   PHYSICIAN:  Adolph Pollack, M.D.DATE OF BIRTH:  09/25/1940   DATE OF ADMISSION:  09/20/2006  DATE OF DISCHARGE:  09/26/2006                               DISCHARGE SUMMARY   PRINCIPAL DISCHARGE DIAGNOSES:  1. Biliary pancreatitis.  2. Right inguinal hernia.  3. Postoperative ileus.  4. Hyperlipidemia.  5. Hypertension.  6. Hypothyroidism.  7. Hiatal hernia with gastroesophageal reflux.  8. Nephrolithiasis.   PROCEDURE:  Laparoscopic cholecystectomy with intraoperative  cholangiogram and right inguinal herniorrhaphy with mesh September 20, 2006.   REASON FOR ADMISSION:  This is a 70 year old male who had a symptomatic  right inguinal hernia.  He was due to see me in the office in a week or  two, but prior to that, he was admitted with the biliary pancreatitis.  This resolved.  He was subsequently admitted for the above two  procedures.   HOSPITAL COURSE:  He underwent the above two procedures and tolerated  this fairly well, although had a little difficulty voiding.  He then  developed some nausea and had to have catheterization.  His KUB showed a  very large stomach and an NG tube was placed.  He he started passing  some gas and a small bowel movement, began voiding better.  By his  fourth postoperative day the NG tube was clamped and was able to be  removed on postoperative day #5 and he was started on clear liquid diet.  His diet was advanced.  I had some concern about having delayed gastric  emptying and he reported chronic  bloating problems at home and we  discussed him getting a gastroenterology workup at home as an  outpatient.  By postoperative day #6, September 26, 2006, he was feeling  better, tolerating his diet and he was able to be discharged.   DISPOSITION:  Discharged to home on  September 26, 2006.  He is given  discharge instructions and will come back to see me in the office in 2  weeks.  He was in satisfactory condition.      Adolph Pollack, M.D.  Electronically Signed     TJR/MEDQ  D:  11/01/2006  T:  11/01/2006  Job:  [id number]   cc:   Maryelizabeth Rowan, M.D.

## 2011-01-27 NOTE — Discharge Summary (Signed)
[name]  Patient:    [name] Pratt, [name] Pratt Visit Number: [id number] MRN: [id number]          Service Type: MED Location: [phone] Attending Physician:  Glennon Hamilton Dictated by:   Tereso Newcomer, P.A.-C. Adm. Date:  05/07/2001 Disc. Date: 05/08/01   CC:         [employer], [place]. Arlyce Dice, M.D.  Novella Olive, M.D., [facility]   Discharge Summary  DATE OF BIRTH:  06-01-1941  DISCHARGE DIAGNOSES: 1. Postoperative bradycardia and hypotension (suspect vagal response). 2. Questionable syncope related to #1. 3. Hypotension. 4. Hyperlipidemia. 5. Hypothyroidism. 6. Status post bilateral blepharoplasty. 7. Gastroesophageal reflux disease/hiatal hernia. 8. Status post Nissen fundoplication.  HOSPITAL COURSE:  This 70 year old married white male was sent over from outpatient surgery for bradycardia and hypotension.  On the date of admission, he underwent bilateral blepharoplasty by Dr. Benna Dunks.  Procedure was performed under local anesthesia.  AFter the procedure, the patient sat up and became bradycardic with a heart rate in the 30s and hypotensive with a blood pressure of 80s/60s.  He was noted to feel nauseated and claimed to have passed out. He was given atropine x 3.  He denied any history of chest pain but did note some shortness of breath with exertion at times.  Upon arrival to the emergency room, his blood pressure was stable at 100/74 and his pulse was in the 40s.  Initial exam revealed a well-nourished, well-developed male in no acute distress.  Blood pressure 100/74.  Neck without JVD or bruit.  Lungs clear to auscultation.  Cardiac:  S1, S2.  Regular rate and rhythm. Extremities without edema.  Abdomen soft, nontender.  EKG:  Heart rate 41, sinus bradycardia, first-degree AV block, and nonspecific ST-T wave changes. Patient was admitted and serial enzymes were checked.  He ruled out for  MI by enzymes x 3.  He was monitored on telemetry and remained in sinus bradycardia. Given his risk factors, it was felt that he should undergo evaluation for ischemia.  He underwent adenosine Cardiolite on May 08, 2001.  Images revealed no ischemia, normal wall motion, and EF of 50%.  Patients blood pressure began to recover with hydration.  Upon admission, his blood pressure medicines were held.  PRior to discharge, his blood pressure was 168/82. Lisinopril and atenolol were both restarted.  Verapamil was held until he has a follow-up with his primary care physician.  He was discharged to home in stable condition.  Upon admission, he was noted to have some mild hyperglycemia with a glucose of 155 mg per dl.  Hemoglobin A1C was normal at 5.9.  LABORATORY DATA:  White blood count 6400, hemoglobin 12.7, hematocrit 36.7, platelet count 313,000, sodium 141, potassium 3.8, chloride 106, CO2 28, glucose 155, BUN 16, creatinine 1.2, total bilirubin 0.8, alkaline phosphatase 69, AST 25, ALT 21, total protein 7.5, albumin 3.8, calcium 5.9.  Hemoglobin A1C 5.9.  Cardiac enzymes negative x 3.  TSH 0.413.  DISCHARGE MEDICATIONS: 1. Lisinopril 40 mg q.d. 2. Atenolol 50 mg qd 3. Ranitidine 150 mg b.i.d. 4. Simvastatin 20 mg q.h.s. 5. Levothyroxine 0.15 mg q.d. 6. Gemfibrozil 600 mg b.i.d. 7. Aspirin 81 mg q.d.  ACTIVITY:  As tolerated.  DIET:  Low fat, low sodium.  FOLLOW-UP:  Patient is to follow up with Dr. Arlyce Dice next week.  He should call for an appointment.  As noted above, his verapamil is held and  this can be restarted as an outpatient with his primary care physician if felt to be necessary.  He can follow up with Dr. Cecil Cranker as needed for cardiology follow-up. Dictated by:   Tereso Newcomer, P.A.-C. Attending Physician:  Glennon Hamilton DD:  05/08/01 TD:  05/08/01 Job: [id number]

## 2011-01-27 NOTE — Discharge Summary (Signed)
NAME:  [name] Pratt, [name] Pratt NO.:  [id number]   MEDICAL RECORD NO.:  [id number]          PATIENT TYPE:  INP   LOCATION:  1511                         FACILITY:  [facility]   PHYSICIAN:  Hollice Espy, M.D.DATE OF BIRTH:  [birth year]   DATE OF ADMISSION:  07/31/2006  DATE OF DISCHARGE:  08/04/2006                                 DISCHARGE SUMMARY   DISCHARGE DIAGNOSES:  1. Gallstone pancreatitis, now resolved.  2. Cholecystitis, currently stable.  Patient will need followup and      possible cholecystectomy.  3. Hypertension.  4. Hypothyroidism.  5. Hyperlipidemia.   DISCHARGE MEDICATIONS:  Patient will continue all of his previous  medications:  Allopurinol 100 mg p.o. in the morning, 300 mg in the evening;  cephalexin p.r.n. dental visits, colchicine 0.6 p.o. daily p.r.n., Cozaar  100 p.o. daily, gemfibrozil 600 1-2 tabs p.o. b.i.d., felodipine 10 p.o.  daily, ammonium lactate 12% lotion, Synthroid 125 mcg p.o. daily, methyldopa  250 b.i.d., metronidazole topical cream for rosacea 0.75%, simvastatin 20  p.o. daily, aspirin 81 p.o. daily.  New medications:  Percocet 5/325 1-2  tabs p.o. q.6h. p.r.n., and Phenergan 12.5 p.o. q.6h. p.r.n.   DISCHARGE DIET:  Low fat, low sodium diet.   DISPOSITION:  Improved.   ACTIVITY:  As tolerated, although he is advised not to drive for 24 hours  after taking Phenergan.  He is being discharged to home.   FOLLOW-UP APPOINTMENTS:  Patient has an appointment scheduled with [facility], Dr. Abbey Chatters, which he believes is on the 28th of this  month for hernia evaluation.  I am advising him to keep this appointment;  however, to call Dr. Maris Berger office and let them know that the main  reason now for the appointment will be followup for evaluation for a  cholecystectomy.   HOSPITAL COURSE:  Patient is a 70 year old white male with a past medical  history of gout, hypertension, and hyperlipidemia, who presents to  the  emergency room with a complaint of nausea, vomiting, and abdominal pain.  The patient was found in the emergency room to have a pancreatitis with some  stranding around the duodenum and hepatic flexure.  In addition, patient was  noted to have essentially normal transaminases.  His alk phos was normal as  well; however, his white count was found to also be normal as well; however,  his lipase level was elevated.  Patient was with a lipase level of 986.  The  patient was made n.p.o., started on IV fluids, given medication for pain,  and over the next several days, his lipase trended down.   By August 03, 2006, his lipase was now down to 41.  He was started on  clear liquids, which he tolerated.  His diet was advanced.  By the day of  discharge, he is tolerating solid food upon discharge, and once he was able  to complete a solid food meal, be discharged home.  In addition, in regards  to the workup the patient had fasting lipid profile done, which showed no  evidence of any hypertriglyceridemia.   An ultrasound of  the abdomen done on admission noted cholelithiasis with  physiologically distended gallbladder, some mild gallbladder wall thickening  measuring 4.4 mm was noted as well.  Given the fact that the patient denies  any heavy alcohol use, it was likely felt that the gallstones were the cause  of his pancreatitis.  The patient over the next several days continued to  improve.  Plan will be for him to follow up with [facility] as an  outpatient to have his gallbladder removed now this pancreatitis is stable.  The rest of his medical issues were unremarkable during this  hospitalization.   PLAN:  Disposition is improved, and he is being discharged to home.      Hollice Espy, M.D.  Electronically Signed     SKK/MEDQ  D:  08/04/2006  T:  08/04/2006  Job:  [id number]   cc:   Adolph Pollack, M.D.  [address], [address]  [place]  [place] [address]

## 2011-01-27 NOTE — Op Note (Signed)
NAME:  [name] Pratt, [name] Pratt NO.:  [id number]   MEDICAL RECORD NO.:  [id number]          PATIENT TYPE:  INP   LOCATION:  0012                         FACILITY:  [facility]   PHYSICIAN:  Erasmo Leventhal, M.D.DATE OF BIRTH:  1941-06-22   DATE OF PROCEDURE:  07/25/2004  DATE OF DISCHARGE:                                 OPERATIVE REPORT   PREOPERATIVE DIAGNOSIS:  Right knee end-stage osteoarthritis.   POSTOPERATIVE DIAGNOSIS:  Right knee end-stage osteoarthritis.   PROCEDURE:  Right total knee arthroplasty.   SURGEON:  Erasmo Leventhal, M.D.   ASSISTANT:  Jaquelyn Bitter. Chabon, P.A.-C.   ANESTHESIA:  Spinal epidural.   ESTIMATED BLOOD LOSS:  Less than 50 cc.   DRAINS:  Two Hemovac.   COMPLICATIONS:  None.   TOURNIQUET TIME:  One hour and 25 minutes at 350 mmHg.   OPERATIVE IMPLANTS:  Osteonics components, all cemented, size 9, femur size  11.  Tibia 12 mm flexed posterior stabilized tibial insert, 26 mm patella.   OPERATIVE DETAILS:  Patient was counseled in the holding area.  The correct  size was identified.  An IV was started.  Antibiotics were given.  Taken to  the OR where a spinal epidural was administered.  Following this, a Foley  catheter was placed utilizing a sterile technique by the OR circulating  nurse.  The right knee was examined.  He had about a 2-3 degree flexion  contracture, he could flex to 130 degrees.  He was elevated, prepped with  Duraprep, and was draped in a sterile fashion.  Exsanguinated with Esmarch  and tourniquet was inflated to 350 mmHg.   A straight midline incision was made through the skin and subcutaneous  tissue.  Medial lateral soft tissue flaps were developed.  A medial  parapatellar arthrotomy was performed.  The patella was everted, and the  knee was flexed.  I also did a proximal medial soft tissue release back to  the semimembranosus insertion.  End-stage arthritic changes and cruciate  ligaments were resected.   A starting hole was made in the distal femur.  The  canal was irrigated.  The effluent was clear.  An intramedullary rod was  gently placed.  I chose a 5 degree valgus cut and took a 10 mm cut off the  distal femur.  The distal femur was found to be a size #9.  Rotation marks  were made.  The distal femur was cut through the size #9.  Medial lateral  menisci were removed under direct visualization.  The posterior  neurovascular structures were thought of and protected throughout the entire  case.  The tibial eminence was resected.  The proximal tibia was found to be  a size 11.  A starting hole was made.  A step reamer was utilized.  An  intramedullary rod was gently placed.  I chose a 0 degree slope with a 10 mm  cut based upon the lateral side, giving excellent take off the proximal  tibia.  The posterior medial and postoperative femoral osteophytes were  removed under direct visualization.  The posterior neurovascular structures  were thought  of and protected throughout the entire case.  The femoral  trochlea was prepared in a standard fashion now.  At this time, the size 9  femur and size 11 tibia with a 10 mm flexion insert with excellent range of  motion and soft tissue balance.  Rotational marks were made, and the Delta  keel was performed in the standard fashion.  The patella was found to be a  size 26 and reamed to the appropriate depth.  Locking holes were removed.  Excess bone was removed.  The knee was then copiously irrigated with  pulsatile lavage while the cement was thoroughly mixed, utilizing moderate  cement technique.  All components were cemented into place, a size 11 tibia,  a size 9 femur, a 26 patella.  After the cement was cured, we did a 10 and  12 mm thick tibial inserts with a 12 mm thick flexed, posterior stabilized  tibial insert.  We had excellent range of motion, soft tissue balance, and  flexion and extension with excellent gaps.  The patellofemoral tract  was  anatomic.  The tibial trial was then removed.  Excess cement was removed.  Bone wax was placed to expose the bony surfaces.  Then a final 12 mm thick  posterior stabilized, flexed tibial polyethylene was placed.  The knee was  irrigated with antibiotic solution during the closure of each layer.  Two  medium Hemovac drains were placed.  Sequential closure of the layers were  done now of the arthrotomy with Vicryl, subcu Vicryl, skin closed  subcuticular Monocryl suture.  The drain was hooked to suction.  Sterile  compressive dressing applied.  The tourniquet was deflated.  Another gram of  Ancef was given intravenously.  A sterile compressive dressing applied along  with Ace wrap and ice packs.  The knee was mobilized.  There were no  complications.  Sponge and needle counts were correct.  He was gently  awakened.  He was taken to the recovery room in stable condition.  No  complications.      RAC/MEDQ  D:  07/25/2004  T:  07/25/2004  Job:  [id number]

## 2011-01-27 NOTE — H&P (Signed)
[facility]  Patient:    [name] Pratt, [name] Pratt Visit Number: [id number] MRN: [id number]          Service Type: Attending:  Radene Knee., M.D. Dictated by:   Radene Knee., M.D.                           History and Physical  CONTINUATION  SOCIAL HISTORY:  Patient is married and has one child, does not abuse alcohol or tobacco and is retired from the [employer].  REVIEW OF SYSTEMS:  General health is fairly good.  Weight has been stable. HEENT:  Has had some problems with his eyes.  Has a deviated septum.  Rarely has headaches.  CARDIORESPIRATORY:  Denies any chest pain, shortness of breath or heart attack.  GI:  He has not had any symptoms from his hiatal hernia since it was repaired in 1995.  He has had no peptic ulcer disease.  Bowels move normally.  BONES, JOINTS AND MUSCLES:  He does have some arthritis. Denies gout.  NEUROPSYCHIATRIC:  No stroke.  SKIN:  No new skin lesions noted. LYMPHATIC AND HEMATOPOIETIC:  No nodes or anemia.  PHYSICAL EXAMINATION:  GENERAL:  The physical examination revealed a well-developed, well-nourished 70 year old male.  VITAL SIGNS:  Blood pressure 160/84, pulse 64, respirations 20, temperature 97.  HEENT:  The ears and tympanic membranes are unremarkable.  Eyes react normally to light and accommodation.  Extraocular movements intact.  Pharynx benign. Teeth in good condition.  NECK:  No enlargement of the thyroid.  No nodes are palpable.  CHEST:  Clear to percussion and auscultation.  HEART:  Normal sinus rhythm.  No murmur.  No enlarged.  ABDOMEN:  No masses or tenderness.  Liver, kidneys, spleen, hernia not detected.  He does have laparoscopic scars from his hiatal hernia repair.  GENITALIA:  The external genitalia and meatus are normal.  The penis is uncircumcised.  The testes are good size, symmetrical.  The right epididymis is slightly thickened but nontender.  Scrotum, anus and  perineum normal.  RECTAL:  Rectal tone good.  Prostate is 25 g, firm, broad, smooth, flat, symmetrical, nontender.  EXTREMITIES:  No edema.  Good peripheral pulses.  NEUROLOGIC:  Grossly normal reflexes and sensation.  IMPRESSION: 1. Enlarging left lower calyceal calcifications, 8 to 10 mm. 2. Right extracorporeal shock wave lithotripsy x2, 1998. 3. Benign prostatic hypertrophy, post transurethral resection of the prostate    or internal urethrotomy in 1992. 4. Hypertension. 5. Peyronies disease, quiescent. 6. History of hypotension associated with anesthesia.  PLAN:  Plan is to get an anesthesia consult, schedule ESWL on October 31, 2001. Dictated by:   Radene Knee., M.D. Attending:  Radene Knee., M.D. DD:  09/17/01 TD:  09/17/01 Job: [id number]

## 2011-01-27 NOTE — Discharge Summary (Signed)
[facility]  Patient:    [name] Pratt, [name] Pratt                  MRN: [id number] Adm. Date:  [id number] Disc. Date: [id number] Attending:  Erasmo Leventhal Dictator:   Nyoka Cowden, P.A.-C.                           Discharge Summary  ADMISSION DIAGNOSES: 1.   End-stage osteoarthritis left knee. 2.   Hypertension. 3.   Hyperthyroidism. 4.   Hyperlipidemia. 5.   History of renal calculus. 6.   High frequency hearing loss.  DISCHARGE DIAGNOSES: 1.   End-stage osteoarthritis left knee, status post left total      knee arthroplasty. 2.   Hypertension. 3.   Hyperthyroidism. 4.   Hyperlipidemia. 5.   History of renal calculus. 6.   High frequency hearing loss. 7.   Mild postoperative ileus, resolved.  CONSULTS:  None.  OPERATIONS:  Left total knee arthroplasty.  SURGEON:  Osborne Oman. Thomasena Edis, M.D.  ASSISTANT:  Massie Kluver, P.A.-C.  ANESTHESIA:  Under spinal anesthesia with epidural postoperative pain control.  BRIEF HISTORY:  [name] Pratt is a 70 year old male who has end-stage osteoarthritis affecting the left knee.  He has failed conservative measures and wishes to proceed with operative intervention in the way of left total knee arthroplasty. Risks and benefits are discussed with the patient and he wishes to proceed.  HOSPITAL COURSE: The patient is admitted and underwent the above named procedure and tolerated this well.  All appropriate IV antibiotics and analgesics were provided.  Postoperatively, the patient was placed on epidural pain control and kept at bed rest until anesthesia dictated his activity levels.  They removed his epidural on 07/31/00.  The patient did extremely well with pain control.  He, however, had some difficulty with his bowels. He had increasing flatus and mild nausea.  He had one liquid bowel movement after his suppository, however, his abdomen developed distention.  He had decrease in bowel sounds.  A KUB showed  probable ileus.  His labs remained within normal limits.  His abdomen remained soft, however, distended.  Suppositories and activity were increased.  The patient had allergy to Reglan therefore this was not able to be used.  The patient had his diet decreased to liquid only and his IV fluids continued through postoperative day four.  He had good result with several bowel movements and his abdomen was soft and nontender with good bowel sounds after that.  His knee did extremely well postoperatively.  He had no problems.  He had mild swelling with no drainage, no erythema.  By date 08/04/00, he was doing extremely well, tolerated p.o. and solid foods at this time, having bowel movement, flatus, and voiding without difficulty.  His abdomen was soft, nontender, with positive bowel sounds throughout, his incision was dry, he was neurovascularly intact, and he was afebrile.  At this time, he is felt medically ready for discharge to home and to follow-up on an outpatient basis.  LABORATORY DATA:  Admission hemoglobin 13.4, postoperatively 12.8, 11.4 and 11.3.  Protimes and INRs followed by pharmacy on Coumadin.  Routine chemistries were within normal limits except for glucose 118 to 163.  One sodium was at 133, otherwise, normal. Urinalysis on admission was normal and blood type showed O positive.  EKG on admission showed sinus bradycardia, nonspecific T wave, with no old S tracings. A preoperative chest film showed  no active disease and a KUB on 11/22 showed increased large and small bowel suggestive of an ileus.  CONDITION ON DISCHARGE:  Stable and improved.  DISCHARGE MEDS AND PLANS:  The patient is being discharged to home to continue total knee replacement protocol on an outpatient physical therapy basis.  He is on Coumadin per pharmacy.  He is to resume his home meds and home diet. Prescriptions are given for Percocet #31 every 4-6 h p.r.n. pain, Darvocet-N 100 1-2 every 4-6 h p.r.n.  mild pain, and Robaxin 500 mg #31 every 8 p.r.n. and Coumadin per pharmacy.    The patient is to follow-up in our office on Monday, November 26 for therapy and follow-up in our office with Dr. Thomasena Edis in two weeks postoperatively, call for time. DD:  08/20/00 TD:  08/20/00 Job: [id number]

## 2011-01-27 NOTE — Op Note (Signed)
   NAME:  [name] Pratt, [name] Pratt NO.:  [id number]   MEDICAL RECORD NO.:  [id number]                   PATIENT TYPE:  AMB   LOCATION:  ENDO                                 FACILITY:  [facility]   PHYSICIAN:  Anselmo Rod, M.D.               DATE OF BIRTH:  [birth year]   DATE OF PROCEDURE:  05/04/2003  DATE OF DISCHARGE:                                 OPERATIVE REPORT   PROCEDURE PERFORMED:  Screening colonoscopy.   ENDOSCOPIST:  Charna Elizabeth, M.D.   INSTRUMENT USED:  Olympus video colonoscope.   INDICATIONS FOR PROCEDURE:  The patient is a 70 year old white male  undergoing screening colonoscopy to rule out colonic polyps, masses, etc.   PREPROCEDURE PREPARATION:  Informed consent was procured from the patient.  The patient was fasted for eight hours prior to the procedure and prepped  with a bottle of magnesium citrate and a gallon of GoLYTELY the night prior  to the procedure.   PREPROCEDURE PHYSICAL:  The patient had stable vital signs.  Neck supple.  Chest clear to auscultation.  Abdomen soft with normal bowel sounds.   DESCRIPTION OF PROCEDURE:  The patient was placed in left lateral decubitus  position and sedated with 50 mg of Demerol and 5 mg of Versed intravenously.  Once the patient was adequately sedated and maintained on low flow oxygen  and continuous cardiac monitoring, the Olympus video colonoscope was  advanced from the rectum to the cecum.  There was some residual stool on the  right side of the colon.  The appendicular orifice and ileocecal valve were  visualized and photographed.  Small internal hemorrhoids were seen on  retroflexion in the rectum.  Several large diverticula were seen in the left  colon.  No masses or polyps were identified.  The patient tolerated the  procedure well without complications.   IMPRESSION:  1. Small nonbleeding internal hemorrhoids.  2. Sigmoid diverticulosis.  3. No masses or polyps were seen.   RECOMMENDATIONS:  1. A high fiber diet with liberal fluid intake has been recommended.  2. Repeat colorectal cancer screening is recommended in the next 10 years     unless the patient develops any abnormal symptoms in the interim.  3. Outpatient follow-up on a p.r.n. basis.                                                  Anselmo Rod, M.D.    JNM/MEDQ  D:  05/04/2003  T:  05/04/2003  Job:  [id number]   cc:   Teena Irani. Arlyce Dice, M.D.  [address]  [place]  [place] [address]  Fax: [phone]

## 2011-01-27 NOTE — Op Note (Signed)
NAME:  [name] Pratt, [name] Pratt NO.:  [id number]   MEDICAL RECORD NO.:  [id number]          PATIENT TYPE:  OIB   LOCATION:  0098                         FACILITY:  [facility]   PHYSICIAN:  Adolph Pollack, M.D.DATE OF BIRTH:  1941-04-06   DATE OF PROCEDURE:  09/20/2006  DATE OF DISCHARGE:                               OPERATIVE REPORT   PREOPERATIVE DIAGNOSES:  1. Biliary pancreatitis.  2. Right inguinal hernia.   POSTOPERATIVE DIAGNOSES:  1. Biliary pancreatitis.  2. Right inguinal hernia.   PROCEDURE:  1. Laparoscopic cholecystectomy, intraoperative cholangiogram.  2. Open right inguinal hernia repair with mesh.   SURGEON:  Adolph Pollack, M.D.   ASSISTANT:  Clovis Pu. Cornett, M.D.   ANESTHESIA:  General.   INDICATIONS:  [name] Pratt is a 70 year old male with known right  inguinal hernia that had become symptomatic.  He was scheduled to see me  to discuss repair of this and a week or two prior to that he was  admitted and diagnosed with biliary pancreatitis which resolved.  He now  presents for both the above procedures.   TECHNIQUE:  He was seen in the holding area and the right groin marked  my initials.  He is then brought to the operating room, placed supine on  the operating table and general anesthetic was administered.  Hair in  the appropriate part of the abdominal wall and in the right groin was  clipped and the areas were sterilely prepped and draped.  He had had a  previous laparoscopic Nissen fundoplication.  There is a small inferior  epigastric incision.  I injected Marcaine solution superficially and  deep around this incision and reincised the scar, carried this through  the subcutaneous tissues until I identified midline fascia.  Small  incision was made in the midline fascia.  A pursestring suture of 0  Vicryl was placed around the fascial edges.  Peritoneal cavity was then  entered under direct vision.  A Hasson trocar was introduced  into the  peritoneal cavity and pneumoperitoneum created by insufflation of CO2  gas.   Following this the laparoscope was introduced and I noticed minimal  adhesions especially the upper abdomen.  An 11-mm trocar was placed  through an epigastric incision and two 5 mm trocars placed in the right  mid lateral abdomen.  The patient was placed in reverse Trendelenburg  position with a right side tilted slightly up.  The fundus of the  gallbladder was grasped and retracted toward the right shoulder.  The  infundibulum was grasped and using careful blunt dissection, staying on  the gallbladder, it was mobilized..  I then isolated the cystic duct and  created a  window around it.  There was also an anterior branch of the  cystic artery which I isolated creating a window around it.  A clip was  placed at the cystic duct gallbladder junction.  Small incision was made  in the cystic duct and bile was milked back.  A cholangiocatheter was  passed through the abdominal wall and placed into the cystic duct and a  cholangiogram was performed.  Under real time fluoroscopy dilute contrast material was injected into  the cystic duct which was of moderate to long length.  The common  hepatic, right and left hepatic, common bile ducts all filled promptly  and contrast drained into the duodenum promptly.  The pancreatic duct  also filled.  The cholangiocath was then removed, cystic duct was then  clipped three times proximally.  I did not see any obvious filling  defects and the final report on the cholangiogram  is pending.   Following this I clipped the anterior branch of the cystic artery and  divided it, then identified the posterior branch of the cystic artery.  A window was created around it, it was clipped and divided.  The  gallbladder then dissected free from liver bed.  A small hole was made  in the gallbladder and some small stones spilled out.  I evacuated these  by way of a large suction  device.  Gallbladder was placed in Endopouch  bag.  I copiously irrigated out the gallbladder fossa and around the  perihepatic area and controlled bleeding with electrocautery.  I then  evacuated as much of the fluid as possible.  The gallbladder was removed  in the Endopouch bag through the inferior epigastric port and the  fascial defect was closed under laparoscopic vision by tightening up and  tying down the pursestring suture.  The remaining trocars removed and  pneumoperitoneum was released.  The skin incisions were then closed with  4-0 Monocryl subcuticular stitches and a towel was placed over the upper  abdomen.   I then changed my gown and gloves.  I approached the right groin area.  I injected dilute Marcaine solution into the right groin superficially  and deep and the right groin incision was made dividing the skin,  subcutaneous tissue and Scarpa's fascia until external oblique  aponeurosis was identified.  I then injected local anesthetic deep to  the external oblique aponeurosis and made and incision in it through the  external ring medially and up to the anterior superior iliac spine  laterally.  Using blunt dissection I exposed the shelving edge of the  inguinal ligament inferiorly and the internal oblique aponeurosis and  muscle superiorly.  I isolated the spermatic cord.  The direct hernia  defect was noted.  I inspected the cord and did not notice any indirect  sac.   Following this the spermatic cord was retracted superiorly.  A piece of  3 x 6 inch polypropylene mesh was then brought into the field and  anchored 1 to 2 cm medial to the pubic tubercle with 2-0 Prolene suture.  The inferior aspect of mesh was then anchored to the shelving edge of  the inguinal ligament with a running 2-0 Prolene suture up to level 1-2  cm lateral to the internal ring.  A slit was cut in the mesh and two tails were wrapped around spermatic cord.  The superior aspect mesh was   anchored to the internal oblique aponeurosis with interrupted 2-0 Vicryl  sutures.  Two tails of the mesh were then crossed around the spermatic  cord and they were then anchored to the shelving edge of the inguinal  ligament with a 2-0 Prolene suture.  Cord was mobile.  The tip of the  hemostat could be placed in the aperture.  Mesh allowed for more than  adequate coverage of the hernia defect.   Following this, I noted hemostasis to be adequate.  The external oblique  aponeurosis was closed over the mesh and cord with running 3-0 Vicryl  suture.  Scarpa's fascia was approximated with a running 2-0 Vicryl  suture.  Skin was closed with a four Monocryl subcuticular stitch  followed by Steri-Strips and sterile dressings.  The trocar sites for  cholecystectomy with were then dressed with Steri-Strips and sterile  dressings.   He tolerated the procedure without apparent complications and was taken  to recovery room in satisfactory condition.     Adolph Pollack, M.D.  Electronically Signed    TJR/MEDQ  D:  09/20/2006  T:  09/20/2006  Job:  [id number]   cc:   Maryelizabeth Rowan, M.D.

## 2011-01-27 NOTE — H&P (Signed)
NAME:  [name] Pratt, LOVER NO.:  [id number]   MEDICAL RECORD NO.:  [id number]            PATIENT TYPE:   LOCATION:                                 FACILITY:   PHYSICIAN:  Ellwood Dense, M.D.   DATE OF BIRTH:  29-Dec-1940   DATE OF ADMISSION:  DATE OF DISCHARGE:                                HISTORY & PHYSICAL   CHIEF COMPLAINT:  Right knee osteoarthritis.   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  Due to continued  pain in his right knee and failure of conservative measures, he is now  scheduled for total knee arthroplasty of the right knee.  The surgery risks,  benefits, and after care including risk of infection, stiffness, loosening,  DVT with PE were all discussed in detail with the patient.  Questions were  invited and answered and surgery to go ahead as scheduled.   ALLERGIES:  REGLAN.   CURRENT MEDICATIONS:  1.  Allopurinol 300 mg p.o. q.a.m.  2.  Gemfibrozil 600 mg b.i.d.  3.  Levothyroxine 0.125 mg q.a.m.  4.  Cozaar 100 mg q.p.m.  5.  Simvastatin 20 mg 1/2 tablet q.p.m.  6.  Felodipine 10 mg q.p.m.   PAST MEDICAL HISTORY:  Serious medical illnesses include:  1.  Hypertension.  2.  Gout.  3.  Hypercholesterolemia.  4.  Hypothyroidism.   PAST SURGICAL HISTORY:  1.  Hemorrhoidectomy.  2.  Esophageal and ureteral dilatation.  3.  Hiatal hernia repair.  4.  Knee arthroscopy.  5.  Total knee replacement on the left.  6.  Laser eye surgery.   SOCIAL HISTORY:  The patient is married.  He is a IT sales professional.  He does not  smoke and drink.   FAMILY HISTORY:  Hypertension, cancer, and osteoarthritis.   REVIEW OF SYSTEMS:  CNS:  Positive for occasional black-out spells after  surgery, which sound like syncopal episodes.  He has had these worked up and  no causative agent found.   PULMONARY:  Negative for shortness of breath,  PND, orthopnea.  CARDIOVASCULAR:  Positive for hypertension.  Negative for  chest pain or palpitations.  GI:  Negative for ulcers or hepatitis.  Positive for hiatal hernia with surgery and herniorrhaphy.  GU:  Positive  for kidney stones.  MUSCULOSKELETAL:  Positives are in HPI.   PHYSICAL EXAMINATION:  VITAL SIGNS:  Blood pressure 144/84, respirations 16,  pulse 76 and regular.  GENERAL:  This is a well-developed, well-nourished gentleman in no acute  distress.  HEENT:  Head normocephalic.  Nose patent.  Ears patent.  Pupils equal,  round, and reactive to light.  Throat without injection.  NECK:  Supple without adenopathy.  Carotids 2+ without bruit.  CHEST:  Clear to auscultation.  No rales or rhonchi.  RESPIRATORY:  Respirations 16.  HEART:  Regular rate and rhythm at 76 beats per minute, without murmur.  ABDOMEN:  Soft, active bowel sounds.  No masses or organomegaly.  NEUROLOGIC:  The patient is alert and oriented to time, place, and person.  Cranial nerves II-XII are grossly intact.  EXTREMITIES:  Left knee status post total knee arthroplasty with 0-120  degree range of motion and excellent stability.  Right knee with a varus  deformity, 0-125 degree range of motion.  Dorsalis pedis, posterior tibialis  pulses are 2+.  Sensation and circulation are intact.   RADIOLOGY:  X-rays show end-stage osteoarthritis, right knee.   IMPRESSION:  End-stage osteoarthritis, right knee.   PLAN:  Total knee arthroplasty, right knee.      ________________________________________  Jaquelyn Bitter. Jannette Spanner, P.A.  ___________________________________________  Ellwood Dense, M.D.    SJC/MEDQ  D:  07/13/2004  T:  07/13/2004  Job:  [id number]

## 2011-01-27 NOTE — H&P (Signed)
NAME:  [name] Pratt, [name] Pratt NO.:  [id number]   MEDICAL RECORD NO.:  [id number]          PATIENT TYPE:  EMS   LOCATION:  ED                           FACILITY:  [facility]   PHYSICIAN:  Lucita Ferrara, MD         DATE OF BIRTH:  [birth year]   DATE OF ADMISSION:  07/31/2006  DATE OF DISCHARGE:                                HISTORY & PHYSICAL   The patient is a 70 year old male who presents to [facility]  Emergency Room with a chief complaint of abdominal pain that has been going  on for the last 2 or 3 days, progressively getting worse, decreased p.o.  intake.  He was very constipated, bloated, and all the pain is located  centrally in the midepigastric area.  It is accompanied by bloating.  It is  worse with breathing or any kind of movement.  He denies any chest pain,  shortness of breath.  He denies any musculoskeletal pain.  He denies any  nausea or vomiting.  He does have constipation.  He denies any diarrhea.  He  denies any fevers or chills.  He denies any urinary symptoms, including  urinary frequency or hesitancy.  He went ahead and presented to his primary  care doctor at [facility], who did an x-ray of his abdomen and told him that  he should come to the emergency room.  He had a colonoscopy two years ago  which just showed diverticulitis.  He did not have any polyps or lesions or  cancers at that time.  He denies any dark stools, or he denies any blood per  rectum.   PAST MEDICAL HISTORY:  1. Gout.  2. Hypercholesterolemia.  3. Hypertension.  4. Hypothyroidism.  5. Rosacea.   PAST SURGICAL HISTORY:  The patient has had an extensive surgical history,  which includes:  1. He had a hiatal hernia surgery 15 years ago.  2. He had esophageal dilation for some reason several times performed by      Dr. Sharrell Ku.  This was done in 1993 and 1994.  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  I am not entirely clear why      he had  that done.  He had his hiatal hernia repair in 1995.  3. He had a lithotripsy in 2003, the last one.   OTHER TYPES OF PROCEDURES:  1. He has had a Cardiolite stress test in 2002.  2. He had another stress test in 1996.  3. He has had knee surgeries.  4. Hemorrhoid surgery in the past.   MEDICATIONS CURRENTLY:  1. Allopurinol 100 mg 1 tab p.o. daily in the a.m., and 300 mg 1 tab as      well in the morning.  2. Cephalexin 500 mg 4 tabs prior to dental visits if he needs it.  3. Colchicine 0.6 mg as needed.  4. Cozaar 100 mg p.o. daily.  5. Gemfibrozil 600 mg 1-2 times a day, a.m. and p.m.  6. Felodipine 10 mg 1 tab p.o. daily in the p.m.  7. Ammonium lactate 12% lotion as needed in the legs.  8. Levothyroxine 0.125 mg 1 tab p.o. daily.  9. Methyldopa 250 mg 2 times a day.  10.Metronidazole topical cream for rosacea 0.75%.  11.Simvastatin 20 mg 1 tab p.o. daily.  12.He also takes a baby aspirin.  13.Anti-itch medicine.   ALLERGIES:  Apparently, he is allergic to [facility].   PHYSICAL EXAMINATION:  GENERAL:  The patient is in no acute distress.  VITAL SIGNS:  Blood pressure 115/74, pulse 72, respirations 18, pulse  oximetry is 97% on room air.  HEENT:  Normocephalic, atraumatic.  Sclerae anicteric.  Extraocular muscles  intact.  NECK:  No JVD.  No carotid bruits.  No thyromegaly.  The neck is supple.  CARDIOVASCULAR:  S1, S2.  Regular rate and rhythm.  No murmurs, rubs, or  clicks.  ABDOMEN:  Distended slightly.  There is no guarding.  Tenderness to light  palpation diffusely.  RECTAL:  There were no hemorrhoids.  Good sphincter tone.  Stool was brown.  Guaiac was negative.  NEUROLOGIC:  The patient is alert and oriented x3.  Cranial nerves II-XII  grossly intact.  Sensations bilaterally upper and lower extremities intact.  Reflexes bilaterally upper and lower extremities 2+.  Strength 5/5  bilaterally upper and lower extremities.  LUNG:  Clear to auscultation bilaterally.  No  rhonchi, no wheezes, no rales.   LABORATORY DATA:  White count 10.5, hemoglobin 13.4, hematocrit 39.6,  platelets 392.  Sodium 139, potassium 4.3, CO2 24, glucose 127, BUN 26,  creatinine 1, calcium 9.4, AST 65, ALT 69, alkaline phosphatase 98.  Urine  was essentially negative.  Lipase was 986.  CT scan of the abdomen showed:  1.  Abnormal inflammatory stranding around descending duodenum and hepatic  flexure.  Diagnostic considerations include duodenitis, duodenal ulcer,  pancreatitis, or localized colitis.  A duodenal mass is a less likely  differential diagnosis consideration.  Correlate with pancreatic enzyme  levels.  2.  Hiatal hernia.  A wrap of upper stomach extends up through the  diaphragmatic hiatus, resembling a paraesophageal hernia.  3.  Possible  gallstones.  4.  Suspected nonobstructive 2 mm right mid-kidney calculus.  Pelvic CT essentially showed a sigmoid diverticulosis, and abnormal free  fluid in the pelvis adjacent to the sigmoid colon.   ASSESSMENT AND PLAN:  1. This is a 70 year old male who presents with abdominal pain to the      emergency room.  He has a mildly elevated lipase, significant for      pancreatic inflammation.  We will go ahead and admit him to the      hospital for abdominal pain, decreased p.o. intake, and his abnormal CT      finding, which clinically correlates.  In addition, his liver function      tests are a bit elevated, just indicating perhaps some hepatic      inflammation.  We will go ahead and admit him to a regular floor.  Will      keep him n.p.o.  Start him perhaps on broad-spectrum antibiotics to      cover for GI organisms.  Going to go ahead and most likely end up      getting a gastroenterology consult for their recommendations.  The      patient just had a colonoscopy 2-1/2 years ago which was negative.  His      stools are negative for blood, but perhaps at some point he will need a  repeat colonoscopy.  At this point, we  will go ahead and keep him well      hydrated for his pancreatitis, monitor his ins and outs, control his      pain, and wait for the pancreatic inflammation to go down.  Also,      monitor his electrolytes and replace as needed.  2. For his gout, we will go ahead and hold off on allopurinol for now, as      this may also cause pancreatitis.  3. We will go ahead and hold the colchicine for now.  We can continue the      blood pressure medications, and we will continue his thyroid      medications.   I have explained the procedures and hospital course to the patient, and the  patient understands.      Lucita Ferrara, MD  Electronically Signed     RR/MEDQ  D:  08/01/2006  T:  08/01/2006  Job:  [id number]

## 2011-01-27 NOTE — Op Note (Signed)
NAME:  [name] Pratt, MONTS NO.:  [id number]   MEDICAL RECORD NO.:  [id number]                   PATIENT TYPE:  AMB   LOCATION:  [facility]                                 FACILITY:  [facility]   PHYSICIAN:  Adolph Pollack, M.D.            DATE OF BIRTH:  1941-08-05   DATE OF PROCEDURE:  05/27/2004  DATE OF DISCHARGE:  05/27/2004                                 OPERATIVE REPORT   PREOPERATIVE DIAGNOSIS:  Umbilical hernia.   POSTOPERATIVE DIAGNOSIS:  Umbilical hernia.   PROCEDURE:  Umbilical hernia repair with mesh.   SURGEON:  Avel Peace, M.D.   ANESTHESIA:  General plus regional block using dilute Marcaine solution.   INDICATION:  Mr. Sondra Barges is a 70 year old male who was having umbilical  tenderness.  He was noted to have an umbilical hernia and now presents for  repair.  The procedure and the risks were discussed with him.   TECHNIQUE:  He is seen in the holding area then brought to the operating  room, placed supine on the operating table and a general anesthetic was  administered.  The periumbilical area was clipped and then sterilely prepped  and draped.  Dilute Marcaine solution was infiltrated in the periumbilical  fashion and a circumlinear subumbilical incision was made through the skin,  subcutaneous tissue and fascia.  I isolated the umbilicus and noted the  hernia just adjacent to it.  I then dissected the umbilicus free from the  fascia and exposed the hernia.  Using the cautery, I dissected the  subcutaneous fatty tissue away from the fascia for a distance of about 3 cm  around the hernia defect.  I then primary closed the hernia defect with  interrupted 0 Surgilon sutures and left these long.  I next brought a piece  of polypropylene mesh into the field and cut it to an appropriate size to  allow for 3-4 cm of overlap.  I took the previously placed sutures and  threaded them through the mesh and then anchored the mesh down to the  primary repair by tying the sutures down and then cutting them.  The  periphery of the mesh was anchored to the fascia with a running 2-0 Prolene  suture.  This provided for more than adequate coverage of the primary repair  as well for 3-4 cm of overlap.   Following this, I injected the fascia with the dilute Marcaine solution.  The umbilicus was then reimplanted to the mesh with a 3-0 Vicryl suture.  The subcutaneous tissue was closed over the mesh with a running 3-0 Vicryl  suture and the skin was closed with a 4-0 Monocryl subcuticular stitch.  Steri-Strips and sterile dressings were applied.   He tolerated the procedure well without any apparent complications and was  taken to the recovery room in satisfactory condition.  He will be given  outpatient instructions as well as Tylox for pain and followup with me in 2-  3 weeks.      TJR/MEDQ  D:  05/27/2004  T:  05/29/2004  Job:  [id number]   cc:   Maryelizabeth Rowan, M.D.  Fax: [phone]

## 2011-01-27 NOTE — H&P (Signed)
[facility]  Patient:    [name] Pratt, [name] Pratt                    MRN: [id number] Attending:  Teena Irani. Arlyce Dice, M.D. CC:         Surgical Scheduler  [facility]  [address]  [place], [place] [address]   History and Physical  DATE OF BIRTH:  [birth year]  CHIEF COMPLAINT:  Left knee pain.  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  I have seen him for this off and on for many years.  Because of the severity of the symptoms, he eventually saw Dr. Thomasena Edis, and he and Dr. Thomasena Edis decided that a total knee replacement was in order.  He is to have this surgery on November 19.  He currently takes Celebrex 200 mg q.d. to help with his pain.  PAST MEDICAL HISTORY:  Significant in that he is noted to have hypertension and is treated with Atenolol 50 mg and Norvasc 5 mg.  His systolic pressure tends to range in the 140s on the last five times he has been here.  He is known to have hypothyroidism.  This is treated with Levothroid 125 mcg q.d. Finally, he had hyperlipidemia which is well controlled with Lipitor 10 mg q.d.  He does have a history of renal calculus, high frequency hearing loss on the right and a Schatzskis ring.  PAST SURGICAL HISTORY:  History of hemorrhoidectomy in 1961.  TURP by Dr. Annabell Howells.  Nissen fundal plication by Dr. Mardella Layman in 1995.  More recently this year, he had bilateral Lasik surgery at the [facility] with a salutory result.  FAMILY AND SOCIAL HISTORY:  He has one child, a son, who lives near here.  He is married to [employer].  He is a Theatre stage manager.  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  He has done this for several decades.  Tetanus is current June 1995.  He tends to get a flu shot.  He does not, nor has he ever used tobacco.  ALLERGIES:  He tells me that, when he  takes "caines" such as Xylocaine that he has "shock."  When he took Reglan, it "freaked me out."  He has no other drug sensitivities or allergies.  REVIEW OF SYSTEMS:  No headaches, dizziness, light headedness, visual difficulties, difficulty swallowing, cough, congestion, nausea, vomiting, constipation, urologic problems or rashes.  PHYSICAL EXAMINATION:  VITAL SIGNS:  Blood pressure 148/82, pulse 60, respiratory rate 14, temperature 97.2, weight 187, height 67 inches.  HEENT:  Normocephalic and atraumatic.  TMs clear.  PERRLA.  EOM intact.  Fundi benign.  Nares unobstructed.  No septal deviation.  Tongue is midline.  Uvula midline.  NECK:  Supple with no adenopathy, thyroid enlargement, tracheal deviation, bruits or masses.  CHEST:  Expands symmetrically.  No wheezes, rales or rhonchi.  HEART:  Normal size clinically.  No murmurs, rubs, or gallops.  ABDOMEN:  Soft.  No masses, guarding, tenderness or organomegaly.  Bowel sounds are intact.  GENITOURINARY:  The penis is circumcised.  Testes show no hernias present.  RECTAL:  Good anal sphincter tone.  Prostate is symmetrical and nontender.  no masses are note.  The median raphe is easily palpable.  EXTREMITIES:  Examination of the knee is left to Dr. Thomasena Edis.  Good peripheral pulses are noted  without edema.  Normal skin color and temperature are present.  ASSESSMENT: 1. 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 2. Hypertension. 3. Hyperlipidemia. 4. History of renal calculus. 5. Hypothyroidism. 6. 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 8. History of prior surgeries including Lasik eye surgery, TURP,    hemorrhoidectomy and Nissen fundal plication.  SUMMARY:  There are no contraindications to the planned surgical procedure. DD:  06/21/00 TD:  06/22/00 Job: [id number]

## 2011-01-27 NOTE — Discharge Summary (Signed)
[facility]  Patient:    [name] Pratt, [name] Pratt                  MRN: [id number] Adm. Date:  [id number] Disc. Date: [id number] Attending:  Erasmo Leventhal                           Discharge Summary  NO REPORT. DD:  08/20/00 TD:  08/20/00 Job: [id number]

## 2011-06-09 ENCOUNTER — Encounter: Payer: Self-pay | Admitting: Cardiovascular Disease

## 2011-06-12 ENCOUNTER — Encounter: Payer: Self-pay | Admitting: Cardiovascular Disease

## 2011-06-12 ENCOUNTER — Ambulatory Visit (INDEPENDENT_AMBULATORY_CARE_PROVIDER_SITE_OTHER): Payer: Medicare Other | Admitting: Cardiovascular Disease

## 2011-06-12 ENCOUNTER — Ambulatory Visit (INDEPENDENT_AMBULATORY_CARE_PROVIDER_SITE_OTHER)
Admission: RE | Admit: 2011-06-12 | Discharge: 2011-06-12 | Disposition: A | Discharge status: Home / Self Care | Payer: Medicare Other | Source: Ambulatory Visit | Attending: Cardiovascular Disease | Admitting: Cardiovascular Disease

## 2011-06-12 VITALS — BP 124/68 | HR 65 | Resp 18 | Ht 67.0 in | Wt 180.0 lb

## 2011-06-12 DIAGNOSIS — R0600 Dyspnea, unspecified: Secondary | ICD-10-CM | POA: Insufficient documentation

## 2011-06-12 DIAGNOSIS — R0609 Other forms of dyspnea: Secondary | ICD-10-CM

## 2011-06-12 DIAGNOSIS — R0989 Other specified symptoms and signs involving the circulatory and respiratory systems: Secondary | ICD-10-CM

## 2011-06-12 DIAGNOSIS — R079 Chest pain, unspecified: Secondary | ICD-10-CM

## 2011-06-12 HISTORY — DX: Dyspnea, unspecified: R06.00

## 2011-06-12 NOTE — Assessment & Plan Note (Signed)
Inspitory crackles on exam that do not clear with coughing.  F/U CXR

## 2011-06-12 NOTE — Assessment & Plan Note (Signed)
Resolved with no need for F/U stress testing at this time

## 2011-06-12 NOTE — Assessment & Plan Note (Signed)
Cholesterol is at goal.  Continue current dose of statin and diet Rx.  No myalgias or side effects.  F/U  LFT's in 6 months. No results found for this basename: LDLCALC  Labs at Dr [facility] office

## 2011-06-12 NOTE — Progress Notes (Signed)
[name] Pratt returns today for F/U of SSCP, HTN, and increased lipids. He has no documented CAD with a normal myovue 3.14.2008. EF 62%. Echo 03/30/2008 done for dyspnea showed EF 60% with AV sclerosis and mild LAE. 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 He has been compliant with his BP meds and only takes gemfibrizol for triglycerides. His labs are followed at Dr [facility] . He has significant arthritis and sees Dr. Hardie Lora for hip and back issues. These are relieved by Aleve.  Has some exertional dsypnea but no PND orthopnea or edema.  No cough sputum or wheezing.  Has not had CXR in over 2 years  ROS: Denies fever, malais, weight loss, blurry vision, decreased visual acuity, cough, sputum, SOB, hemoptysis, pleuritic pain, palpitaitons, heartburn, abdominal pain, melena, lower extremity edema, claudication, or rash.  All other systems reviewed and negative  General: Affect appropriate Healthy:  appears stated age HEENT: normal Neck supple with no adenopathy JVP normal no bruits no thyromegaly Lungs Inspitory crackles bilaterally at base  with no wheezing and good diaphragmatic motion Heart:  S1/S2 no murmur,rub, gallop or click PMI normal Abdomen: benighn, BS positve, no tenderness, no AAA no bruit.  No HSM or HJR Distal pulses intact with no bruits No edema Neuro non-focal Skin warm and dry No muscular weakness   Current Outpatient Prescriptions  Medication Sig Dispense Refill  . albuterol (PROVENTIL HFA;VENTOLIN HFA) 108 (90 BASE) MCG/ACT inhaler Inhale 2 puffs into the lungs every 6 (six) hours as needed.        [name] allopurinol (ZYLOPRIM) 100 MG tablet Take 100 mg by mouth daily.        [name] allopurinol (ZYLOPRIM) 300 MG tablet Take 300 mg by mouth daily.        [name] amLODipine (NORVASC) 10 MG tablet Take 10 mg by mouth daily.        [name] aspirin 325 MG tablet Take 325 mg by mouth daily.        . cephALEXin (KEFLEX) 500 MG capsule Take 500 mg by mouth as needed.        . colchicine 0.6 MG tablet Take 0.6 mg by  mouth daily.        . cycloSPORINE (RESTASIS) 0.05 % ophthalmic emulsion 1 drop 2 (two) times daily.        [name] doxycycline (DORYX) 100 MG EC tablet Take 100 mg by mouth as needed.        . ferrous sulfate 325 (65 FE) MG tablet Take 325 mg by mouth daily with breakfast.        . gemfibrozil (LOPID) 600 MG tablet Take 600 mg by mouth 2 (two) times daily before a meal.        . levothyroxine (SYNTHROID, LEVOTHROID) 137 MCG tablet Take 137 mcg by mouth daily.        . Loratadine-Pseudoephedrine (ALLERGY RELIEF D PO) Take by mouth.        . losartan (COZAAR) 100 MG tablet Take 100 mg by mouth daily.        . methyldopa (ALDOMET) 250 MG tablet Take 250 mg by mouth 2 (two) times daily.        . metroNIDAZOLE (METROCREAM) 0.75 % cream Apply topically 2 (two) times daily.        . multivitamin (THERAGRAN) per tablet Take 1 tablet by mouth daily.        [name] omeprazole (PRILOSEC) 40 MG capsule Take 40 mg by mouth daily.        [name]  Polyethyl Glycol-Propyl Glycol (SYSTANE OP) Apply to eye.        . simvastatin (ZOCOR) 20 MG tablet Take 20 mg by mouth at bedtime.        . sodium chloride (OCEAN) 0.65 % nasal spray Place 1 spray into the nose as needed.          Allergies  Reglan  Electrocardiogram:  NSR 65 Voltage for LVH  Borderline ECG  Assessment and Plan

## 2011-06-12 NOTE — Assessment & Plan Note (Signed)
Well controlled.  Continue current medications and low sodium Dash type diet.    

## 2011-06-12 NOTE — Assessment & Plan Note (Signed)
>>  ASSESSMENT AND PLAN FOR BENIGN ESSENTIAL HYPERTENSION WRITTEN ON 06/12/2011 11:32 AM BY Wendall Stade, MD  Well controlled.  Continue current medications and low sodium Dash type diet.

## 2011-06-12 NOTE — Patient Instructions (Signed)
A chest x-ray takes a picture of the organs and structures inside the chest, including the heart, lungs, and blood vessels. This test can show several things, including, whether the heart is enlarges; whether fluid is building up in the lungs; and whether pacemaker / defibrillator leads are still in place.  Your physician wants you to follow-up in: 1 year with Dr. [name]. You will receive a reminder letter in the mail two months in advance. If you don't receive a letter, please call our office to schedule the follow-up appointment.

## 2011-06-13 ENCOUNTER — Telehealth: Payer: Self-pay | Admitting: Cardiovascular Disease

## 2011-06-13 NOTE — Telephone Encounter (Signed)
Pt was left message re results told to call if any questions, pls call [phone]

## 2011-06-13 NOTE — Telephone Encounter (Signed)
Spoke with pt, aware of cxr results [name] Pratt

## 2012-01-18 ENCOUNTER — Other Ambulatory Visit: Payer: Self-pay | Admitting: Family Medicine

## 2012-01-18 DIAGNOSIS — E7800 Pure hypercholesterolemia, unspecified: Secondary | ICD-10-CM

## 2012-01-18 DIAGNOSIS — Z79899 Other long term (current) drug therapy: Secondary | ICD-10-CM

## 2012-01-18 DIAGNOSIS — I10 Essential (primary) hypertension: Secondary | ICD-10-CM

## 2012-01-18 HISTORY — DX: Other long term (current) drug therapy: Z79.899

## 2012-01-26 ENCOUNTER — Ambulatory Visit
Admission: RE | Admit: 2012-01-26 | Discharge: 2012-01-26 | Disposition: A | Discharge status: Home / Self Care | Payer: Medicare Other | Source: Ambulatory Visit | Attending: Family Medicine | Admitting: Family Medicine

## 2012-01-26 DIAGNOSIS — I10 Essential (primary) hypertension: Secondary | ICD-10-CM

## 2012-01-26 DIAGNOSIS — E7800 Pure hypercholesterolemia, unspecified: Secondary | ICD-10-CM

## 2012-07-08 DIAGNOSIS — K439 Ventral hernia without obstruction or gangrene: Secondary | ICD-10-CM

## 2012-07-08 HISTORY — DX: Ventral hernia without obstruction or gangrene: K43.9

## 2012-07-15 HISTORY — PX: INGUINAL HERNIA REPAIR: SUR1180

## 2012-07-15 HISTORY — PX: ABDOMINAL HERNIA REPAIR: SHX539

## 2012-09-11 HISTORY — PX: HEMORRHOID SURGERY: SHX153

## 2012-10-07 ENCOUNTER — Encounter: Payer: Self-pay | Admitting: Cardiovascular Disease

## 2012-10-07 ENCOUNTER — Ambulatory Visit (INDEPENDENT_AMBULATORY_CARE_PROVIDER_SITE_OTHER): Payer: Medicare Other | Admitting: Cardiovascular Disease

## 2012-10-07 VITALS — BP 164/86 | HR 81 | Wt 184.0 lb

## 2012-10-07 DIAGNOSIS — I10 Essential (primary) hypertension: Secondary | ICD-10-CM

## 2012-10-07 DIAGNOSIS — E785 Hyperlipidemia, unspecified: Secondary | ICD-10-CM

## 2012-10-07 NOTE — Progress Notes (Signed)
Patient ID: [name] Pratt, male   DOB: [birth year], 72 y.o.   MRN: [id number] [name] Pratt returns today for F/U of SSCP, HTN, and increased lipids. He has no documented CAD with a normal myovue 3.14.2008. EF 62%. Echo 03/30/2008 done for dyspnea showed EF 60% with AV sclerosis and mild LAE. 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 He has been compliant with his BP meds and only takes gemfibrizol for triglycerides. His labs are followed at Dr [facility] . He has significant arthritis and sees Dr. Hardie Lora for hip and back issues. These are relieved by Aleve. Has some exertional dsypnea but no PND orthopnea or edema. No cough sputum or wheezing.   Since last seen had hernia repair still with ventral hernia.    ROS: Denies fever, malais, weight loss, blurry vision, decreased visual acuity, cough, sputum, SOB, hemoptysis, pleuritic pain, palpitaitons, heartburn, abdominal pain, melena, lower extremity edema, claudication, or rash.  All other systems reviewed and negative  General: Affect appropriate Healthy:  appears stated age HEENT: normal Neck supple with no adenopathy JVP normal no bruits no thyromegaly Lungs clear with no wheezing and good diaphragmatic motion Heart:  S1/S2 no murmur, no rub, gallop or click PMI normal Abdomen: benighn, BS positve, no tenderness, no AAA no bruit.  No HSM or HJR Distal pulses intact with no bruits No edema Neuro non-focal Skin warm and dry No muscular weakness   Current Outpatient Prescriptions  Medication Sig Dispense Refill  . albuterol (PROVENTIL HFA;VENTOLIN HFA) 108 (90 BASE) MCG/ACT inhaler Inhale 2 puffs into the lungs every 6 (six) hours as needed.        [name] allopurinol (ZYLOPRIM) 100 MG tablet Take 100 mg by mouth daily.        [name] allopurinol (ZYLOPRIM) 300 MG tablet Take 300 mg by mouth daily.        [name] amLODipine (NORVASC) 10 MG tablet Take 10 mg by mouth daily.        . cephALEXin (KEFLEX) 500 MG capsule Take 500 mg by mouth as needed.        . colchicine 0.6  MG tablet Take 0.6 mg by mouth as needed.       . cycloSPORINE (RESTASIS) 0.05 % ophthalmic emulsion 1 drop 2 (two) times daily.        . ferrous sulfate 325 (65 FE) MG tablet Take 325 mg by mouth daily with breakfast.        . gemfibrozil (LOPID) 600 MG tablet 1/2 TAB PO BID      . levothyroxine (SYNTHROID, LEVOTHROID) 137 MCG tablet Take 137 mcg by mouth daily.        . Loratadine-Pseudoephedrine (ALLERGY RELIEF D PO) Take by mouth as needed.       [name] losartan (COZAAR) 100 MG tablet Take 100 mg by mouth daily.        . methyldopa (ALDOMET) 250 MG tablet Take 250 mg by mouth 3 (three) times daily.       . metroNIDAZOLE (METROCREAM) 0.75 % cream Apply topically 2 (two) times daily.        . multivitamin (THERAGRAN) per tablet Take 1 tablet by mouth daily.        . Omega-3 Fatty Acids (FISH OIL PO) Take 3 tablets by mouth daily.      [name] omeprazole (PRILOSEC) 40 MG capsule Take 40 mg by mouth daily.        Bertram Gala Glycol-Propyl Glycol (SYSTANE OP) Apply to eye as needed.       [name]  simvastatin (ZOCOR) 20 MG tablet Take 20 mg by mouth at bedtime.        . sodium chloride (OCEAN) 0.65 % nasal spray Place 1 spray into the nose as needed.          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

## 2012-10-07 NOTE — Assessment & Plan Note (Signed)
Cholesterol is at goal.  Continue current dose of statin and diet Rx.  No myalgias or side effects.  F/U  LFT's in 6 months. No results found for this basename: LDLCALC             

## 2012-10-07 NOTE — Assessment & Plan Note (Signed)
Well controlled.  Continue current medications and low sodium Dash type diet.    

## 2012-10-07 NOTE — Assessment & Plan Note (Signed)
>>  ASSESSMENT AND PLAN FOR BENIGN ESSENTIAL HYPERTENSION WRITTEN ON 10/07/2012 11:38 AM BY Wendall Stade, MD  Well controlled.  Continue current medications and low sodium Dash type diet.

## 2012-10-07 NOTE — Patient Instructions (Signed)
Your physician recommends that you schedule a follow-up appointment in: AS NEEDED  Your physician recommends that you continue on your current medications as directed. Please refer to the Current Medication list given to you today.  

## 2012-10-09 ENCOUNTER — Telehealth: Payer: Self-pay | Admitting: Family

## 2012-10-09 NOTE — Telephone Encounter (Signed)
Received medical records from [facility]

## 2012-11-21 ENCOUNTER — Telehealth: Payer: Self-pay | Admitting: *Deleted

## 2012-11-21 NOTE — Telephone Encounter (Signed)
Pt left message wanting to know if he should be fasting for his appt on 11/25/12. Attempted to reach pt and left message with his wife to have pt return my call. Pt may fast for this appt.

## 2012-11-22 NOTE — Telephone Encounter (Signed)
Notified pt's wife  

## 2012-11-25 ENCOUNTER — Encounter: Payer: Self-pay | Admitting: Family

## 2012-11-25 ENCOUNTER — Ambulatory Visit (INDEPENDENT_AMBULATORY_CARE_PROVIDER_SITE_OTHER): Payer: Medicare Other | Admitting: Family

## 2012-11-25 VITALS — BP 162/80 | HR 68 | Temp 97.8°F | Resp 16 | Ht 67.0 in | Wt 183.0 lb

## 2012-11-25 DIAGNOSIS — R739 Hyperglycemia, unspecified: Secondary | ICD-10-CM

## 2012-11-25 DIAGNOSIS — R7309 Other abnormal glucose: Secondary | ICD-10-CM

## 2012-11-25 DIAGNOSIS — E039 Hypothyroidism, unspecified: Secondary | ICD-10-CM

## 2012-11-25 DIAGNOSIS — I10 Essential (primary) hypertension: Secondary | ICD-10-CM

## 2012-11-25 DIAGNOSIS — G4733 Obstructive sleep apnea (adult) (pediatric): Secondary | ICD-10-CM

## 2012-11-25 DIAGNOSIS — K4090 Unilateral inguinal hernia, without obstruction or gangrene, not specified as recurrent: Secondary | ICD-10-CM

## 2012-11-25 DIAGNOSIS — R1031 Right lower quadrant pain: Secondary | ICD-10-CM

## 2012-11-25 DIAGNOSIS — E785 Hyperlipidemia, unspecified: Secondary | ICD-10-CM

## 2012-11-25 DIAGNOSIS — G4730 Sleep apnea, unspecified: Secondary | ICD-10-CM

## 2012-11-25 DIAGNOSIS — M109 Gout, unspecified: Secondary | ICD-10-CM

## 2012-11-25 HISTORY — DX: Sleep apnea, unspecified: G47.30

## 2012-11-25 HISTORY — DX: Obstructive sleep apnea (adult) (pediatric): G47.33

## 2012-11-25 LAB — LIPID PANEL
HDL: 32 mg/dL — ABNORMAL LOW (ref >39)
LDL Cholesterol: 96 mg/dL (ref 0–99)

## 2012-11-25 LAB — HEPATIC FUNCTION PANEL
ALT: 29 U/L (ref 0–53)
AST: 28 U/L (ref 0–37)
Alkaline Phosphatase: 107 U/L (ref 39–117)
Indirect Bilirubin: 0.6 mg/dL (ref 0.0–0.9)
Total Protein: 8.3 g/dL (ref 6.0–8.3)

## 2012-11-25 LAB — BASIC METABOLIC PANEL WITH GFR
BUN: 20 mg/dL (ref 6–23)
CO2: 25 mEq/L (ref 19–32)
Chloride: 104 mEq/L (ref 96–112)
Creat: 1.02 mg/dL (ref 0.50–1.35)
Glucose, Bld: 111 mg/dL — ABNORMAL HIGH (ref 70–99)

## 2012-11-25 LAB — TSH: TSH: 1.117 u[IU]/mL (ref 0.350–4.500)

## 2012-11-25 MED ORDER — HYDRALAZINE HCL 25 MG PO TABS: 25.0000 mg | ORAL_TABLET | Freq: Three times a day (TID) | ORAL | Status: DC

## 2012-11-25 NOTE — Patient Instructions (Addendum)
Please complete your lab work prior to leaving. You will be contacted about your referral to the surgeon.  Please let us know if you have not heard back within 1 week about your referral. Follow up in 3 months. Welcome to [facility]!

## 2012-11-25 NOTE — Assessment & Plan Note (Signed)
S/p repair- now with occasional recurrent pain in the right groin. Will refer back to his surgeon for evaluation.

## 2012-11-25 NOTE — Assessment & Plan Note (Signed)
>>  ASSESSMENT AND PLAN FOR BENIGN ESSENTIAL HYPERTENSION WRITTEN ON 11/25/2012 12:07 PM BY O'SULLIVAN, Darica Goren, NP  BP Readings from Last 3 Encounters:  11/25/12 162/80  10/07/12 164/86  06/12/11 124/68   BP elevated. He has been intolerant to diuretics due to gout.  Reports intolerant to beta blockers as well. He brings with him today a home BP log and the majority of his home bp's are 130's over 80's. There are some higher readings but he tells me that this was during or immediately following exercise.  Initially planned addition of hydralazine (rx cancelled at his pharmacy).  He will continue to monitor his BP at home and let us know if BP is high.

## 2012-11-25 NOTE — Progress Notes (Signed)
Subjective:    Patient ID: [name] Pratt, male    DOB: [birth year], 73 y.o.   MRN: [id number]  HPI  [name] is a 72 yr old male who presents today to establish care. He has multiple medical problems. Today he is concerned about groin pain. Followed previously at [facility]. Records are reviewed from his former primary care.   1) Groin pain- has been told that he has a hernia on the right. Saw a surgeon and had hernia repair in the right groin.  07/15/12- Dr. Darlys Gales.    2) HTN- On amlodipine and losartan and aldomet.  Reports that he has been exercising on his elliptical machine.  He reports + compliance with his medications. Reports BP last night was 133/70's.  Reports historically his BP has been very difficult to control.   3) Hyperlipidemia- Currently on simvastatin. Some knee pain.  4) Hypothyroid-currently on synthroid . Reports dose has been stable for some time.   5) Hx of gout- he is maintained on allopurinol. Reports that this is well controlled.  6) he has had hx of pancreatitis- reports that this was prior to cholcystectomy.    Review of Systems  Constitutional: Negative for unexpected weight change.  HENT: Negative for congestion.   Eyes: Negative for visual disturbance.  Respiratory: Negative for cough.   Cardiovascular: Negative for chest pain.       Occasional dependent edema  Gastrointestinal: Negative for nausea, vomiting and diarrhea.  Genitourinary: Negative for dysuria and frequency.  Musculoskeletal: Positive for arthralgias. Negative for myalgias.  Skin: Negative for rash.  Neurological: Negative for headaches.  Hematological: Negative for adenopathy.  Psychiatric/Behavioral:       Denies depression/anxiety   See HPI  Past Medical History  Diagnosis Date  . Hyperlipidemia   . Hypertension   . Chest pain   . Nephrolithiasis   . Hiatal hernia   . Ileus   . Inguinal hernia   . Pancreatitis   . Rosacea   . Gout   .  Arthritis   . History of chicken pox   . GERD (gastroesophageal reflux disease)   . Thyroid disease     hypothyroidism  . History of hemorrhoids     History   Social History  . Marital Status: Married    Spouse Name: N/A    Number of Children: 1  . 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 Smoking status: Former Smoker -- 11 years    Types: Cigarettes  . Smokeless tobacco: Never Used  . Alcohol Use: Yes     Comment: 1 per month  . Drug Use: Not on file  . Sexually Active: Not on file   Other Topics Concern  . Not on file   Social History Narrative  . No narrative on file    Past Surgical History  Procedure Laterality Date  . Laparoscopic cholecystectomy  09/20/06    with intraoperative cholangiogram and right inguinal herniorrhaphy with mesh   . Hiatal hernia repair  12/21/93  . Knee surgery  1995  . Hemorrhoid surgery    . Esophageal dilation    . Lithotripsy    . Inguinal hernia repair Right 07/15/12    Dr Ashley Jacobs  . Abdominal hernia repair  07/15/12    Dr Ashley Jacobs  . Nasal septum surgery  1975  . Esophageal dilation  1993 and 1994  .  Brow lift  05/07/01  . Eye surgery Left 03/25/10    cataract removal  . Joint replacement  07/25/04    right knee  . Joint replacement  07/13/10    hip  . Joint replacement  08/16/11    hip    Family History  Problem Relation Age of Onset  . Hypertension    . Cancer    . Osteoarthritis      Allergies  Allergen Reactions  . Metoclopramide Hcl     anxiety    Current Outpatient Prescriptions on File Prior to Visit  Medication Sig Dispense Refill  . albuterol (PROVENTIL HFA;VENTOLIN HFA) 108 (90 BASE) MCG/ACT inhaler Inhale 2 puffs into the lungs every 6 (six) hours as needed.        [name] allopurinol (ZYLOPRIM) 100 MG tablet Take 100 mg by mouth daily.        [name] allopurinol (ZYLOPRIM) 300 MG tablet Take 300 mg by mouth daily.        [name] amLODipine  (NORVASC) 10 MG tablet Take 10 mg by mouth daily.        . colchicine 0.6 MG tablet Take 0.6 mg by mouth as needed.       . cycloSPORINE (RESTASIS) 0.05 % ophthalmic emulsion 1 drop 2 (two) times daily.        . ferrous sulfate 325 (65 FE) MG tablet Take 325 mg by mouth daily with breakfast.        . gemfibrozil (LOPID) 600 MG tablet 1/2 TAB PO BID      . levothyroxine (SYNTHROID, LEVOTHROID) 137 MCG tablet Take 137 mcg by mouth daily.        . Loratadine-Pseudoephedrine (ALLERGY RELIEF D PO) Take by mouth as needed.       [name] losartan (COZAAR) 100 MG tablet Take 100 mg by mouth daily.        . methyldopa (ALDOMET) 250 MG tablet Take 250 mg by mouth 3 (three) times daily.       . multivitamin (THERAGRAN) per tablet Take 1 tablet by mouth daily.        . Omega-3 Fatty Acids (FISH OIL PO) Take 1,200 mg by mouth 2 (two) times daily.       [name] omeprazole (PRILOSEC) 40 MG capsule Take 40 mg by mouth daily.        Bertram Gala Glycol-Propyl Glycol (SYSTANE OP) Apply to eye as needed.       . simvastatin (ZOCOR) 20 MG tablet Take 20 mg by mouth at bedtime.        . sodium chloride (OCEAN) 0.65 % nasal spray Place 1 spray into the nose as needed.        . cephALEXin (KEFLEX) 500 MG capsule Take 500 mg by mouth as needed. 4 capsules 30-60 minutes Prior to dental appt       No current facility-administered medications on file prior to visit.    BP 162/80  Pulse 68  Temp(Src) 97.8 F (36.6 C) (Oral)  Resp 16  Ht 5\' 7"  (1.702 m)  Wt 183 lb 0.6 oz (83.026 kg)  BMI 28.66 kg/m2  SpO2 97%       Objective:   Physical Exam  Constitutional: He is oriented to person, place, and time. He appears well-developed and well-nourished. No distress.  HENT:  Head: Normocephalic and atraumatic.  Mouth/Throat: No oropharyngeal exudate.  Eyes: No scleral icterus.  Cardiovascular: Normal rate and regular rhythm.   No murmur heard. Pulmonary/Chest: Breath sounds normal.  No respiratory distress. He has no wheezes.  He has no rales. He exhibits no tenderness.  Abdominal: Soft. Bowel sounds are normal. He exhibits no distension and no mass. There is no tenderness. There is no rebound and no guarding.  Musculoskeletal: He exhibits no edema.  Lymphadenopathy:    He has no cervical adenopathy.  Neurological: He is alert and oriented to person, place, and time.  Psychiatric: He has a normal mood and affect. His behavior is normal. Judgment and thought content normal.          Assessment & Plan:

## 2012-11-25 NOTE — Assessment & Plan Note (Signed)
Continue statin, check LFT, FLP.

## 2012-11-25 NOTE — Assessment & Plan Note (Addendum)
BP Readings from Last 3 Encounters:  11/25/12 162/80  10/07/12 164/86  06/12/11 124/68   BP elevated. He has been intolerant to diuretics due to gout.  Reports intolerant to beta blockers as well. He brings with him today a home BP log and the majority of his home bp's are 130's over 80's. There are some higher readings but he tells me that this was during or immediately following exercise.  Initially planned addition of hydralazine (rx cancelled at his pharmacy).  He will continue to monitor his BP at home and let us know if BP is high.

## 2012-11-25 NOTE — Assessment & Plan Note (Addendum)
Obtain TSH, continue synthroid.  

## 2012-11-25 NOTE — Assessment & Plan Note (Signed)
Clinically stable on allopurinol. Continue same.  

## 2012-11-26 ENCOUNTER — Encounter: Payer: Self-pay | Admitting: Family

## 2012-11-26 DIAGNOSIS — Z0000 Encounter for general adult medical examination without abnormal findings: Secondary | ICD-10-CM

## 2012-11-29 DIAGNOSIS — R1031 Right lower quadrant pain: Secondary | ICD-10-CM

## 2012-11-29 HISTORY — DX: Right lower quadrant pain: R10.31

## 2012-12-10 HISTORY — PX: OTHER SURGICAL HISTORY: SHX169

## 2012-12-11 ENCOUNTER — Observation Stay (HOSPITAL_BASED_OUTPATIENT_CLINIC_OR_DEPARTMENT_OTHER)
Admission: EM | Admit: 2012-12-11 | Discharge: 2012-12-12 | Disposition: A | Discharge status: Home / Self Care | Payer: Medicare Other | Attending: Internal Medicine | Admitting: Internal Medicine

## 2012-12-11 ENCOUNTER — Encounter (HOSPITAL_BASED_OUTPATIENT_CLINIC_OR_DEPARTMENT_OTHER): Payer: Self-pay | Admitting: *Deleted

## 2012-12-11 DIAGNOSIS — K921 Melena: Principal | ICD-10-CM | POA: Insufficient documentation

## 2012-12-11 DIAGNOSIS — K560 Paralytic ileus: Secondary | ICD-10-CM

## 2012-12-11 DIAGNOSIS — I10 Essential (primary) hypertension: Secondary | ICD-10-CM | POA: Insufficient documentation

## 2012-12-11 DIAGNOSIS — L719 Rosacea, unspecified: Secondary | ICD-10-CM

## 2012-12-11 DIAGNOSIS — G4733 Obstructive sleep apnea (adult) (pediatric): Secondary | ICD-10-CM | POA: Diagnosis present

## 2012-12-11 DIAGNOSIS — K922 Gastrointestinal hemorrhage, unspecified: Secondary | ICD-10-CM

## 2012-12-11 DIAGNOSIS — N200 Calculus of kidney: Secondary | ICD-10-CM

## 2012-12-11 DIAGNOSIS — M109 Gout, unspecified: Secondary | ICD-10-CM | POA: Diagnosis present

## 2012-12-11 DIAGNOSIS — G4730 Sleep apnea, unspecified: Secondary | ICD-10-CM | POA: Insufficient documentation

## 2012-12-11 DIAGNOSIS — E039 Hypothyroidism, unspecified: Secondary | ICD-10-CM | POA: Diagnosis present

## 2012-12-11 DIAGNOSIS — E7800 Pure hypercholesterolemia, unspecified: Secondary | ICD-10-CM | POA: Insufficient documentation

## 2012-12-11 DIAGNOSIS — R0600 Dyspnea, unspecified: Secondary | ICD-10-CM

## 2012-12-11 DIAGNOSIS — E785 Hyperlipidemia, unspecified: Secondary | ICD-10-CM

## 2012-12-11 DIAGNOSIS — K449 Diaphragmatic hernia without obstruction or gangrene: Secondary | ICD-10-CM

## 2012-12-11 DIAGNOSIS — K4090 Unilateral inguinal hernia, without obstruction or gangrene, not specified as recurrent: Secondary | ICD-10-CM

## 2012-12-11 DIAGNOSIS — K5732 Diverticulitis of large intestine without perforation or abscess without bleeding: Secondary | ICD-10-CM | POA: Insufficient documentation

## 2012-12-11 LAB — CBC WITH DIFFERENTIAL/PLATELET
Basophils Absolute: 0 10*3/uL (ref 0.0–0.1)
HCT: 36.9 % — ABNORMAL LOW (ref 39.0–52.0)
Lymphocytes Relative: 31 % (ref 12–46)
Monocytes Absolute: 0.9 10*3/uL (ref 0.1–1.0)
Neutro Abs: 3.8 10*3/uL (ref 1.7–7.7)
Platelets: 329 10*3/uL (ref 150–400)
RBC: 3.9 MIL/uL — ABNORMAL LOW (ref 4.22–5.81)
RDW: 12.6 % (ref 11.5–15.5)
WBC: 7.1 10*3/uL (ref 4.0–10.5)

## 2012-12-11 LAB — BASIC METABOLIC PANEL
CO2: 24 mEq/L (ref 19–32)
Chloride: 103 mEq/L (ref 96–112)
Sodium: 140 mEq/L (ref 135–145)

## 2012-12-11 LAB — PROTIME-INR
INR: 1.06 (ref 0.00–1.49)
Prothrombin Time: 13.7 seconds (ref 11.6–15.2)

## 2012-12-11 MED ORDER — SODIUM CHLORIDE 0.9 % IV SOLN
INTRAVENOUS | Status: DC
  Administered 2012-12-11: 23:00:00 via INTRAVENOUS

## 2012-12-11 NOTE — ED Notes (Signed)
MD at bedside. 

## 2012-12-11 NOTE — ED Provider Notes (Signed)
History     CSN: [id number]  Arrival date & time 12/11/12  2141   First MD Initiated Contact with Patient 12/11/12 2256      Chief Complaint  Patient presents with  . Rectal Bleeding    (Consider location/radiation/quality/duration/timing/severity/associated sxs/prior treatment) HPI This is a 72 year old male not on any anticoagulation. He states he had a bowel movement at 8 PM that consisted of stool mixed with bright red blood. He has had 2 subsequent bowel movements are mostly bright red blood. The bleeding is moderate. There no specific exacerbating or mitigating factors. He denies abdominal pain, chest pain, shortness of breath, lightheadedness, nausea or vomiting. He denies melena.  Past Medical History  Diagnosis Date  . Hyperlipidemia   . Hypertension   . Chest pain   . Nephrolithiasis   . Hiatal hernia   . Ileus   . Inguinal hernia   . Pancreatitis   . Rosacea   . Gout   . Arthritis   . History of chicken pox   . GERD (gastroesophageal reflux disease)   . Thyroid disease     hypothyroidism  . History of hemorrhoids   . OSA (obstructive sleep apnea)     Past Surgical History  Procedure Laterality Date  . Laparoscopic cholecystectomy  09/20/06    with intraoperative cholangiogram and right inguinal herniorrhaphy with mesh   . Hiatal hernia repair  12/21/93  . Knee surgery  1995  . Hemorrhoid surgery    . Esophageal dilation    . Lithotripsy    . Inguinal hernia repair Right 07/15/12    Dr Ashley Jacobs  . Abdominal hernia repair  07/15/12    Dr Ashley Jacobs  . Nasal septum surgery  1975  . Esophageal dilation  1993 and 1994  . Brow lift  05/07/01  . Eye surgery Left 03/25/10    cataract removal  . Joint replacement  07/25/04    right knee  . Joint replacement  07/13/10    hip  . Joint replacement  08/16/11    hip  . Urethral dilation  1991    Dr. Annabell Howells  . Epidural injections      multiple procedures    Family History  Problem Relation Age of Onset  .  Hypertension    . Cancer    . Osteoarthritis      History  Substance Use Topics  . Smoking status: Former Smoker -- 11 years    Types: Cigarettes  . Smokeless tobacco: Never Used  . Alcohol Use: Yes     Comment: 1 per month      Review of Systems  All other systems reviewed and are negative.    Allergies  Metoclopramide hcl  Home Medications   Current Outpatient Rx  Name  Route  Sig  Dispense  Refill  . albuterol (PROVENTIL HFA;VENTOLIN HFA) 108 (90 BASE) MCG/ACT inhaler   Inhalation   Inhale 2 puffs into the lungs every 6 (six) hours as needed.           [name] allopurinol (ZYLOPRIM) 100 MG tablet   Oral   Take 100 mg by mouth daily.           [name] allopurinol (ZYLOPRIM) 300 MG tablet   Oral   Take 300 mg by mouth daily.           [name] amLODipine (NORVASC) 10 MG tablet   Oral   Take 10 mg by mouth daily.           [name]  colchicine 0.6 MG tablet   Oral   Take 0.6 mg by mouth as needed.          . cycloSPORINE (RESTASIS) 0.05 % ophthalmic emulsion      1 drop 2 (two) times daily.           . ferrous sulfate 325 (65 FE) MG tablet   Oral   Take 325 mg by mouth daily with breakfast.           . gemfibrozil (LOPID) 600 MG tablet      1/2 TAB PO BID         . levothyroxine (SYNTHROID, LEVOTHROID) 137 MCG tablet   Oral   Take 137 mcg by mouth daily.           . Loratadine-Pseudoephedrine (ALLERGY RELIEF D PO)   Oral   Take by mouth as needed.          [name] losartan (COZAAR) 100 MG tablet   Oral   Take 100 mg by mouth daily.           . methyldopa (ALDOMET) 250 MG tablet   Oral   Take 250 mg by mouth 3 (three) times daily.          . multivitamin (THERAGRAN) per tablet   Oral   Take 1 tablet by mouth daily.           . Omega-3 Fatty Acids (FISH OIL PO)   Oral   Take 1,200 mg by mouth 2 (two) times daily.          [name] omeprazole (PRILOSEC) 40 MG capsule   Oral   Take 40 mg by mouth daily.           Bertram Gala Glycol-Propyl  Glycol (SYSTANE OP)   Ophthalmic   Apply to eye as needed.          . simvastatin (ZOCOR) 20 MG tablet   Oral   Take 20 mg by mouth at bedtime.           . sodium chloride (OCEAN) 0.65 % nasal spray   Nasal   Place 1 spray into the nose as needed.             BP 162/80  Pulse 79  Temp(Src) 97.6 F (36.4 C) (Oral)  Resp 16  Ht 5\' 7"  (1.702 m)  Wt 180 lb (81.647 kg)  BMI 28.19 kg/m2  SpO2 96%  Physical Exam General: Well-developed, well-nourished male in no acute distress; appearance consistent with age of record HENT: normocephalic, atraumatic Eyes: pupils equal round and reactive to light; extraocular muscles intact Neck: supple Heart: regular rate and rhythm Lungs: clear to auscultation bilaterally Abdomen: soft; nondistended; nontender; no masses or hepatosplenomegaly; bowel sounds present Rectal: Normal sphincter tone; gross blood in the rectal vault Extremities: No deformity; full range of motion; pulses normal; no edema Neurologic: Awake, alert and oriented; motor function intact in all extremities and symmetric; no facial droop Skin: Warm and dry Psychiatric: Normal mood and affect    ED Course  Procedures (including critical care time)     MDM   Nursing notes and vitals signs, including pulse oximetry, reviewed.  Summary of this visit's results, reviewed by myself:  Labs:  Results for orders placed during the hospital encounter of 12/11/12 (from the past 24 hour(s))  CBC WITH DIFFERENTIAL     Status: Abnormal   Collection Time    12/11/12 10:59 PM      Result  Value Range   WBC 7.1  4.0 - 10.5 K/uL   RBC 3.90 (*) 4.22 - 5.81 MIL/uL   Hemoglobin 12.6 (*) 13.0 - 17.0 g/dL   HCT 16.1 (*) 09.6 - 04.5 %   MCV 94.6  78.0 - 100.0 fL   MCH 32.3  26.0 - 34.0 pg   MCHC 34.1  30.0 - 36.0 g/dL   RDW 40.9  81.1 - 91.4 %   Platelets 329  150 - 400 K/uL   Neutrophils Relative 53  43 - 77 %   Neutro Abs 3.8  1.7 - 7.7 K/uL   Lymphocytes Relative 31  12  - 46 %   Lymphs Abs 2.2  0.7 - 4.0 K/uL   Monocytes Relative 12  3 - 12 %   Monocytes Absolute 0.9  0.1 - 1.0 K/uL   Eosinophils Relative 3  0 - 5 %   Eosinophils Absolute 0.2  0.0 - 0.7 K/uL   Basophils Relative 0  0 - 1 %   Basophils Absolute 0.0  0.0 - 0.1 K/uL  BASIC METABOLIC PANEL     Status: Abnormal   Collection Time    12/11/12 10:59 PM      Result Value Range   Sodium 140  135 - 145 mEq/L   Potassium 3.7  3.5 - 5.1 mEq/L   Chloride 103  96 - 112 mEq/L   CO2 24  19 - 32 mEq/L   Glucose, Bld 123 (*) 70 - 99 mg/dL   BUN 24 (*) 6 - 23 mg/dL   Creatinine, Ser 7.82  0.50 - 1.35 mg/dL   Calcium 9.6  8.4 - 95.6 mg/dL   GFR calc non Af Amer 59 (*) >90 mL/min   GFR calc Af Amer 68 (*) >90 mL/min  PROTIME-INR     Status: None   Collection Time    12/11/12 10:59 PM      Result Value Range   Prothrombin Time 13.7  11.6 - 15.2 seconds   INR 1.06  0.00 - 1.49  APTT     Status: None   Collection Time    12/11/12 10:59 PM      Result Value Range   aPTT 28  24 - 37 seconds           Carlisle Beers Marjean Imperato, MD 12/11/12 2344

## 2012-12-11 NOTE — ED Notes (Signed)
Pt c/o rectal bleeding x 3 hrs denies pain

## 2012-12-12 ENCOUNTER — Encounter (HOSPITAL_COMMUNITY): Payer: Self-pay | Admitting: *Deleted

## 2012-12-12 DIAGNOSIS — K922 Gastrointestinal hemorrhage, unspecified: Secondary | ICD-10-CM

## 2012-12-12 DIAGNOSIS — E039 Hypothyroidism, unspecified: Secondary | ICD-10-CM

## 2012-12-12 DIAGNOSIS — K5732 Diverticulitis of large intestine without perforation or abscess without bleeding: Secondary | ICD-10-CM | POA: Diagnosis present

## 2012-12-12 HISTORY — DX: Gastrointestinal hemorrhage, unspecified: K92.2

## 2012-12-12 LAB — MRSA PCR SCREENING: MRSA by PCR: NEGATIVE

## 2012-12-12 LAB — HEMOGLOBIN AND HEMATOCRIT, BLOOD
HCT: 35.8 % — ABNORMAL LOW (ref 39.0–52.0)
HCT: 37.2 % — ABNORMAL LOW (ref 39.0–52.0)
Hemoglobin: 12.4 g/dL — ABNORMAL LOW (ref 13.0–17.0)
Hemoglobin: 13.3 g/dL (ref 13.0–17.0)

## 2012-12-12 MED ORDER — LOSARTAN POTASSIUM 50 MG PO TABS
100.0000 mg | ORAL_TABLET | Freq: Every day | ORAL | Status: DC
  Administered 2012-12-12: 100 mg via ORAL
  Filled 2012-12-12: qty 2

## 2012-12-12 MED ORDER — SODIUM CHLORIDE 0.9 % IJ SOLN: 3.0000 mL | Freq: Two times a day (BID) | INTRAMUSCULAR | Status: DC

## 2012-12-12 MED ORDER — PANTOPRAZOLE SODIUM 40 MG PO TBEC
80.0000 mg | DELAYED_RELEASE_TABLET | Freq: Every day | ORAL | Status: DC
  Administered 2012-12-12: 80 mg via ORAL
  Filled 2012-12-12: qty 1

## 2012-12-12 MED ORDER — ALBUTEROL SULFATE HFA 108 (90 BASE) MCG/ACT IN AERS: 2.0000 | INHALATION_SPRAY | Freq: Four times a day (QID) | RESPIRATORY_TRACT | Status: DC | PRN

## 2012-12-12 MED ORDER — DEXTROSE-NACL 5-0.9 % IV SOLN
INTRAVENOUS | Status: DC
  Administered 2012-12-12: 1000 mL via INTRAVENOUS

## 2012-12-12 MED ORDER — GEMFIBROZIL 600 MG PO TABS
600.0000 mg | ORAL_TABLET | Freq: Two times a day (BID) | ORAL | Status: DC
  Administered 2012-12-12 (×2): 600 mg via ORAL
  Filled 2012-12-12 (×3): qty 1

## 2012-12-12 MED ORDER — AMLODIPINE BESYLATE 10 MG PO TABS
10.0000 mg | ORAL_TABLET | Freq: Every day | ORAL | Status: DC
  Administered 2012-12-12: 10 mg via ORAL
  Filled 2012-12-12: qty 1

## 2012-12-12 MED ORDER — ACETAMINOPHEN 325 MG PO TABS
650.0000 mg | ORAL_TABLET | ORAL | Status: DC | PRN
  Administered 2012-12-12 (×2): 650 mg via ORAL
  Filled 2012-12-12 (×2): qty 2

## 2012-12-12 MED ORDER — LEVOTHYROXINE SODIUM 137 MCG PO TABS
137.0000 ug | ORAL_TABLET | Freq: Every day | ORAL | Status: DC
  Administered 2012-12-12: 137 ug via ORAL
  Filled 2012-12-12 (×2): qty 1

## 2012-12-12 MED ORDER — ALLOPURINOL 300 MG PO TABS
300.0000 mg | ORAL_TABLET | Freq: Every day | ORAL | Status: DC
  Administered 2012-12-12: 300 mg via ORAL
  Filled 2012-12-12: qty 1

## 2012-12-12 MED ORDER — SIMVASTATIN 20 MG PO TABS
20.0000 mg | ORAL_TABLET | Freq: Every day | ORAL | Status: DC
  Filled 2012-12-12: qty 1

## 2012-12-12 MED ORDER — CYCLOSPORINE 0.05 % OP EMUL
1.0000 [drp] | Freq: Two times a day (BID) | OPHTHALMIC | Status: DC
  Administered 2012-12-12: 1 [drp] via OPHTHALMIC
  Filled 2012-12-12 (×2): qty 1

## 2012-12-12 MED ORDER — OMEGA-3-ACID ETHYL ESTERS 1 G PO CAPS
1.0000 g | ORAL_CAPSULE | Freq: Two times a day (BID) | ORAL | Status: DC
  Administered 2012-12-12: 1 g via ORAL
  Filled 2012-12-12 (×2): qty 1

## 2012-12-12 MED ORDER — METHYLDOPA 250 MG PO TABS
250.0000 mg | ORAL_TABLET | Freq: Three times a day (TID) | ORAL | Status: DC
  Administered 2012-12-12 (×2): 250 mg via ORAL
  Filled 2012-12-12 (×4): qty 1

## 2012-12-12 NOTE — Progress Notes (Signed)
Brief Nutrition Note  Malnutrition Screening Tool result is inaccurate.  Please consult if nutrition needs are identified.  Danese Dorsainvil MS, RD, LDN Pager: [phone] After-hours pager: [phone]  

## 2012-12-12 NOTE — Progress Notes (Signed)
NOTIFIED DR. VANN OF PT'S MOST RECENT HGB OF 13.2 AND SHE IS GOING TO DC PT.  WILL HOLD OFF ON TRANSFER D/T TO CHANGE IN PLAN.

## 2012-12-12 NOTE — Progress Notes (Signed)
Patient seen and examined by me.  Plan to transfer out of SDU to floor bed  Spoke with on call GI- has appointment with  [facility] on 4/14 for colonscopy.  No further bleeding.  Hgb stable.  Vital signs stable.  No need for intervention here unless bleeding returns.  Will monitor overnight and d/c in AM if still stable  Marlin Canary

## 2012-12-12 NOTE — Progress Notes (Signed)
DC INSTRUCTIONS GIVEN AT THIS TIME RE: F/U APPTS, DIET, ACTIVITY, S/S OF PROBLEMS TO REPORT TO MD, MEDS, COMMUNITY RESOURCES, AND MY CHART.  PT VERBALIZED UNDERSTANDING OF ALL INSTRUCTIONS.

## 2012-12-12 NOTE — Discharge Summary (Signed)
Physician Discharge Summary  [name] Pratt ZOX:[id number] DOB: 12-27-1940 DOA: 12/11/2012  PCP: Lemont Fillers., NP  Admit date: 12/11/2012 Discharge date: 12/14/2012  Time spent: 35 minutes  Recommendations for Outpatient Follow-up:  CBC in 1 week Keep appointment for colonoscopy  Discharge Diagnoses:  Active Problems:   HYPOTHYROIDISM   HYPERCHOLESTEROLEMIA   GOUT   HYPERTENSION   OSA (obstructive sleep apnea)   Lower GI bleed   Diverticulitis of colon (without mention of hemorrhage)   Discharge Condition: improved  Diet recommendation: cardiac  Filed Weights   12/11/12 2145 12/12/12 0134 12/12/12 0443  Weight: 81.647 kg (180 lb) 80.9 kg (178 lb 5.6 oz) 80.9 kg (178 lb 5.6 oz)    History of present illness:  [name] Pratt is an 72 y.o. male with history of diverticulosis, last colonoscopy over 10 years ago, scheduled to have a repeat colonoscopy with his internist (not at this hospital), history of hypertension, hyperlipidemia, gout, arthritis for which he took occasional naproxen, not anticoagulated, presents to [facility] with several bouts of bright red blood per rectum mixed with stool. He denied black stool (he has been taking iron supplements), lightheadedness, palpitation, chest pain, shortness of breath, diarrhea, or any abdominal pain. Evaluation in emergency room show normal hemodynamics (if any thing, hypertensive), hemoglobin of 12.6 g per decaliter, platelet count 329,000, BUN of 24. He has no gastroenterologist. Hospitalist was asked to admit him for lower GI bleed.   Hospital Course:  Spoke with on call GI- has appointment with [facility] on 4/14 for colonscopy. No further bleeding in hospital. Hgb stable. Vital signs stable. No need for intervention here unless bleeding returns- patient requesting to go home   Procedures:  none  Consultations:  none  Discharge Exam: Filed Vitals:   12/12/12 1100 12/12/12 1300 12/12/12 1500  12/12/12 1700  BP: 151/78 155/66 129/63 146/73  Pulse: 68 65 66 65  Temp: 97.7 F (36.5 C)  97.8 F (36.6 C)   TempSrc: Oral  Oral   Resp: 16 15 13 15   Height:      Weight:      SpO2: 95% 96% 97% 93%    General: A+Ox3, NAD Cardiovascular: rrr Respiratory: clear anterior  Discharge Instructions      Discharge Orders   Future Appointments Provider Department Dept Phone   02/24/2013 9:30 AM [name], NP [facility] [phone]   07/15/2013 1:00 PM Melvyn Novas, MD [facility] [phone]   Future Orders Complete By Expires     Diet - low sodium heart healthy  As directed     Discharge instructions  As directed     Comments:      Return to ER if any further bleeding CBC 1 week Keep appointment for colonoscopy    Increase activity slowly  As directed         Medication List    TAKE these medications       albuterol 108 (90 BASE) MCG/ACT inhaler  Commonly known as:  PROVENTIL HFA;VENTOLIN HFA  Inhale 2 puffs into the lungs every 6 (six) hours as needed for shortness of breath.     allopurinol 100 MG tablet  Commonly known as:  ZYLOPRIM  Take 100 mg by mouth daily.     allopurinol 300 MG tablet  Commonly known as:  ZYLOPRIM  Take 300 mg by mouth daily.     amLODipine 10 MG tablet  Commonly known as:  NORVASC  Take 10 mg  by mouth daily.     cephALEXin 500 MG capsule  Commonly known as:  KEFLEX  Take 2,000 mg by mouth daily as needed (priir to dental procedures).     cycloSPORINE 0.05 % ophthalmic emulsion  Commonly known as:  RESTASIS  Place 1 drop into both eyes 2 (two) times daily.     diclofenac sodium 1 % Gel  Commonly known as:  VOLTAREN  Apply 1 application topically daily as needed (arthritis).     Fish Oil 1200 MG Caps  Take 1,200 mg by mouth 2 (two) times daily.     gemfibrozil 600 MG tablet  Commonly known as:  LOPID  Take 300 mg by mouth 2 (two) times daily before a meal.     IRON PO   Take 1 tablet by mouth every evening.     levothyroxine 137 MCG tablet  Commonly known as:  SYNTHROID, LEVOTHROID  Take 137 mcg by mouth daily.     losartan 100 MG tablet  Commonly known as:  COZAAR  Take 100 mg by mouth daily.     methyldopa 250 MG tablet  Commonly known as:  ALDOMET  Take 250 mg by mouth 2 (two) times daily.     metroNIDAZOLE 0.75 % cream  Commonly known as:  METROCREAM  Apply 1 application topically daily as needed (rosacia).     multivitamin per tablet  Take 1 tablet by mouth daily.     omeprazole 20 MG capsule  Commonly known as:  PRILOSEC  Take 20 mg by mouth every morning.     OVER THE COUNTER MEDICATION  Take 1 tablet by mouth daily as needed. Allergy and decongestant pill     OVER THE COUNTER MEDICATION  Place 1 application into both eyes daily as needed (dry eyes). No Tears Eye Ointment     simvastatin 20 MG tablet  Commonly known as:  ZOCOR  Take 20 mg by mouth at bedtime.     sodium chloride 0.65 % nasal spray  Commonly known as:  OCEAN  Place 1 spray into both nostrils at bedtime as needed for congestion.       Follow-up Information   Follow up with Lemont Fillers., NP.   Contact information:   [address] [place] [address] [phone]       Please follow up. (Keep appointment for colonscopy)        The results of significant diagnostics from this hospitalization (including imaging, microbiology, ancillary and laboratory) are listed below for reference.    Significant Diagnostic Studies: No results found.  Microbiology: Recent Results (from the past 240 hour(s))  MRSA PCR SCREENING     Status: None   Collection Time    12/12/12  2:01 AM      Result Value Range Status   MRSA by PCR NEGATIVE  NEGATIVE Final   Comment:            The GeneXpert MRSA Assay (FDA     approved for NASAL specimens     only), is one component of a     comprehensive MRSA colonization     surveillance program. It is not      intended to diagnose MRSA     infection nor to guide or     monitor treatment for     MRSA infections.     Labs: Basic Metabolic Panel:  Recent Labs Lab 12/11/12 2259  NA 140  K 3.7  CL 103  CO2 24  GLUCOSE 123*  BUN 24*  CREATININE 1.20  CALCIUM 9.6   Liver Function Tests: No results found for this basename: AST, ALT, ALKPHOS, BILITOT, PROT, ALBUMIN,  in the last 168 hours No results found for this basename: LIPASE, AMYLASE,  in the last 168 hours No results found for this basename: AMMONIA,  in the last 168 hours CBC:  Recent Labs Lab 12/11/12 2259 12/12/12 0232 12/12/12 0803 12/12/12 1413  WBC 7.1  --   --   --   NEUTROABS 3.8  --   --   --   HGB 12.6* 12.4* 12.7* 13.3  HCT 36.9* 35.8* 37.2* 38.1*  MCV 94.6  --   --   --   PLT 329  --   --   --    Cardiac Enzymes: No results found for this basename: CKTOTAL, CKMB, CKMBINDEX, TROPONINI,  in the last 168 hours BNP: BNP (last 3 results) No results found for this basename: PROBNP,  in the last 8760 hours CBG: No results found for this basename: GLUCAP,  in the last 168 hours     Signed:  Jakarie Pember  [facility] 12/14/2012, 1:40 PM

## 2012-12-12 NOTE — H&P (Signed)
[facility] History and Physical  [name] AVW:[id number] DOB: 04-26-1941    PCP:   [name] Pratt., NP   Chief Complaint: Bright red blood per rectum HPI: [name] Pratt is an 72 y.o. male with history of diverticulosis, last colonoscopy over 10 years ago, scheduled to have a repeat colonoscopy with his internist (not at this hospital), history of hypertension, hyperlipidemia, gout, arthritis for which he took occasional naproxen, not anticoagulated, presents to [facility] with several bouts of bright red blood per rectum mixed with stool. He denied black stool (he has been taking iron supplements), lightheadedness, palpitation, chest pain, shortness of breath, diarrhea, or any abdominal pain. Evaluation in emergency room show normal hemodynamics (if any thing, hypertensive), hemoglobin of 12.6 g per decaliter, platelet count 329,000, BUN of 24. He has no gastroenterologist. Hospitalist was asked to admit him for lower GI bleed.  Rewiew of Systems:  Constitutional: Negative for malaise, fever and chills. No significant weight loss or weight gain Eyes: Negative for eye pain, redness and discharge, diplopia, visual changes, or flashes of light. ENMT: Negative for ear pain, hoarseness, nasal congestion, sinus pressure and sore throat. No headaches; tinnitus, drooling, or problem swallowing. Cardiovascular: Negative for chest pain, palpitations, diaphoresis, dyspnea and peripheral edema. ; No orthopnea, PND Respiratory: Negative for cough, hemoptysis, wheezing and stridor. No pleuritic chestpain. Gastrointestinal: Negative for nausea, vomiting, diarrhea, constipation, abdominal pain, melena,hematemesis, jaundice and rectal bleeding.    Genitourinary: Negative for frequency, dysuria, incontinence,flank pain and hematuria; Musculoskeletal: Negative for back pain and neck pain. Negative for swelling and trauma.;  Skin: . Negative for pruritus, rash, abrasions,  bruising and skin lesion.; ulcerations Neuro: Negative for headache, lightheadedness and neck stiffness. Negative for weakness, altered level of consciousness , altered mental status, extremity weakness, burning feet, involuntary movement, seizure and syncope.  Psych: negative for anxiety, depression, insomnia, tearfulness, panic attacks, hallucinations, paranoia, suicidal or homicidal ideation    Past Medical History  Diagnosis Date  . Hyperlipidemia   . Hypertension   . Chest pain   . Nephrolithiasis   . Hiatal hernia   . Ileus   . Inguinal hernia   . Pancreatitis   . Rosacea   . Gout   . Arthritis   . History of chicken pox   . GERD (gastroesophageal reflux disease)   . Thyroid disease     hypothyroidism  . History of hemorrhoids   . OSA (obstructive sleep apnea)     Past Surgical History  Procedure Laterality Date  . Laparoscopic cholecystectomy  09/20/06    with intraoperative cholangiogram and right inguinal herniorrhaphy with mesh   . Hiatal hernia repair  12/21/93  . Knee surgery  1995  . Hemorrhoid surgery    . Esophageal dilation    . Lithotripsy    . Inguinal hernia repair Right 07/15/12    Dr Ashley Jacobs  . Abdominal hernia repair  07/15/12    Dr Ashley Jacobs  . Nasal septum surgery  1975  . Esophageal dilation  1993 and 1994  . Brow lift  05/07/01  . Eye surgery Left 03/25/10    cataract removal  . Urethral dilation  1991    Dr. Annabell Howells  . Epidural injections      multiple procedures  . Joint replacement  07/25/04    right knee  . Joint replacement  07/13/10    left    Medications:  HOME MEDS: Prior to Admission medications   Medication Sig Start Date End Date Taking?  Authorizing Provider  albuterol (PROVENTIL HFA;VENTOLIN HFA) 108 (90 BASE) MCG/ACT inhaler Inhale 2 puffs into the lungs every 6 (six) hours as needed.      Historical Provider, MD  allopurinol (ZYLOPRIM) 100 MG tablet Take 100 mg by mouth daily.      Historical Provider, MD  allopurinol (ZYLOPRIM)  300 MG tablet Take 300 mg by mouth daily.      Historical Provider, MD  amLODipine (NORVASC) 10 MG tablet Take 10 mg by mouth daily.      Historical Provider, MD  colchicine 0.6 MG tablet Take 0.6 mg by mouth as needed.     Historical Provider, MD  cycloSPORINE (RESTASIS) 0.05 % ophthalmic emulsion 1 drop 2 (two) times daily.      Historical Provider, MD  ferrous sulfate 325 (65 FE) MG tablet Take 325 mg by mouth daily with breakfast.      Historical Provider, MD  gemfibrozil (LOPID) 600 MG tablet 1/2 TAB PO BID    Historical Provider, MD  levothyroxine (SYNTHROID, LEVOTHROID) 137 MCG tablet Take 137 mcg by mouth daily.      Historical Provider, MD  Loratadine-Pseudoephedrine (ALLERGY RELIEF D PO) Take by mouth as needed.     Historical Provider, MD  losartan (COZAAR) 100 MG tablet Take 100 mg by mouth daily.      Historical Provider, MD  methyldopa (ALDOMET) 250 MG tablet Take 250 mg by mouth 3 (three) times daily.     Historical Provider, MD  multivitamin [facility]) per tablet Take 1 tablet by mouth daily.      Historical Provider, MD  Omega-3 Fatty Acids (FISH OIL PO) Take 1,200 mg by mouth 2 (two) times daily.     Historical Provider, MD  omeprazole (PRILOSEC) 40 MG capsule Take 40 mg by mouth daily.      Historical Provider, MD  Polyethyl Glycol-Propyl Glycol (SYSTANE OP) Apply to eye as needed.     Historical Provider, MD  simvastatin (ZOCOR) 20 MG tablet Take 20 mg by mouth at bedtime.      Historical Provider, MD  sodium chloride (OCEAN) 0.65 % nasal spray Place 1 spray into the nose as needed.      Historical Provider, MD     Allergies:  Allergies  Allergen Reactions  . Metoclopramide Hcl     anxiety    Social History:   reports that he quit smoking about 34 years ago. His smoking use included Cigarettes. He smoked 0.00 packs per day for 11 years. He has never used smokeless tobacco. He reports that he drinks about 0.6 ounces of alcohol per week. His drug history is not on  file.  Family History: Family History  Problem Relation Age of Onset  . Hypertension    . Cancer    . Osteoarthritis       Physical Exam: Filed Vitals:   12/11/12 2145 12/12/12 0018 12/12/12 0134  BP: 162/80 160/81 150/78  Pulse: 79 63 58  Temp: 97.6 F (36.4 C)  97.7 F (36.5 C)  TempSrc: Oral  Oral  Resp: 16 16   Height: 5\' 7"  (1.702 m)  5\' 7"  (1.702 m)  Weight: 81.647 kg (180 lb)  80.9 kg (178 lb 5.6 oz)  SpO2: 96% 95% 97%   Blood pressure 150/78, pulse 58, temperature 97.7 F (36.5 C), temperature source Oral, resp. rate 16, height 5\' 7"  (1.702 m), weight 80.9 kg (178 lb 5.6 oz), SpO2 97.00%.  GEN:  Pleasant  patient lying in the stretcher in no  acute distress; cooperative with exam. PSYCH:  alert and oriented x4; does not appear anxious or depressed; affect is appropriate. HEENT: Mucous membranes pink and anicteric; PERRLA; EOM intact; no cervical lymphadenopathy nor thyromegaly or carotid bruit; no JVD; There were no stridor. Neck is very supple. Breasts:: Not examined CHEST WALL: No tenderness CHEST: Normal respiration, clear to auscultation bilaterally.  HEART: Regular rate and rhythm.  There are no murmur, rub, or gallops.   BACK: No kyphosis or scoliosis; no CVA tenderness ABDOMEN: soft and non-tender; no masses, no organomegaly, normal abdominal bowel sounds; no pannus; no intertriginous candida. There is no rebound and no distention. Rectal Exam: Not done EXTREMITIES: No bone or joint deformity; age-appropriate arthropathy of the hands and knees; no edema; no ulcerations.  There is no calf tenderness. Genitalia: not examined PULSES: 2+ and symmetric SKIN: Normal hydration no rash or ulceration CNS: Cranial nerves 2-12 grossly intact no focal lateralizing neurologic deficit.  Speech is fluent; uvula elevated with phonation, facial symmetry and tongue midline. DTR are normal bilaterally, cerebella exam is intact, barbinski is negative and strengths are equaled  bilaterally.  No sensory loss.   Labs on Admission:  Basic Metabolic Panel:  Recent Labs Lab 12/11/12 2259  NA 140  K 3.7  CL 103  CO2 24  GLUCOSE 123*  BUN 24*  CREATININE 1.20  CALCIUM 9.6   Liver Function Tests: No results found for this basename: AST, ALT, ALKPHOS, BILITOT, PROT, ALBUMIN,  in the last 168 hours No results found for this basename: LIPASE, AMYLASE,  in the last 168 hours No results found for this basename: AMMONIA,  in the last 168 hours CBC:  Recent Labs Lab 12/11/12 2259  WBC 7.1  NEUTROABS 3.8  HGB 12.6*  HCT 36.9*  MCV 94.6  PLT 329   Cardiac Enzymes: No results found for this basename: CKTOTAL, CKMB, CKMBINDEX, TROPONINI,  in the last 168 hours  CBG: No results found for this basename: GLUCAP,  in the last 168 hours   Radiological Exams on Admission: No results found.   Assessment/Plan Present on Admission:  . Diverticulitis of colon (without mention of hemorrhage) . OSA (obstructive sleep apnea) . HYPERTENSION . HYPOTHYROIDISM . HYPERCHOLESTEROLEMIA . GOUT  PLAN: Because he had several rectal bleedings, he was admitted to the SDU. I suspect that this is diverticular bleed. He maintain stable hemodynamics. As stated above, he is actually hypertensive. I therefore think he can tolerate his blood pressure medication and I have resumed them. We'll type and screen. He was given clear liquid and I will continue that as well. For his hypothyroidism, will continue supplements and check TSH. He is already on PPI, and I will continue it.  I will stop his iron supplement, in case these get lower endoscopy, it would be easier to see. Please consult GI in the morning. He is stable, full code, and will be [facility]. Thank you for allowing me to partake in the care of this pleasant gentleman.  Other plans as per orders.  Code Status: FULL Unk Lightning, MD. [facility] Pager [phone] 7pm to  7am.  12/12/2012, 2:23 AM

## 2012-12-16 ENCOUNTER — Telehealth: Payer: Self-pay | Admitting: *Deleted

## 2012-12-16 NOTE — Telephone Encounter (Signed)
Patient scheduled an appointment for 12/24/12

## 2012-12-16 NOTE — Telephone Encounter (Signed)
Received message from pt that he was recently in the hospital for possible lower GI bleed and was instructed to follow up with primary in 1 week. Please call pt to arrange appt.

## 2012-12-24 ENCOUNTER — Ambulatory Visit (INDEPENDENT_AMBULATORY_CARE_PROVIDER_SITE_OTHER): Payer: Medicare Other | Admitting: Family

## 2012-12-24 ENCOUNTER — Encounter: Payer: Self-pay | Admitting: Family

## 2012-12-24 VITALS — BP 152/84 | HR 75 | Temp 97.7°F | Ht 67.0 in | Wt 180.1 lb

## 2012-12-24 DIAGNOSIS — K922 Gastrointestinal hemorrhage, unspecified: Secondary | ICD-10-CM

## 2012-12-24 DIAGNOSIS — M169 Osteoarthritis of hip, unspecified: Secondary | ICD-10-CM

## 2012-12-24 DIAGNOSIS — I10 Essential (primary) hypertension: Secondary | ICD-10-CM

## 2012-12-24 DIAGNOSIS — K625 Hemorrhage of anus and rectum: Secondary | ICD-10-CM

## 2012-12-24 HISTORY — DX: Osteoarthritis of hip, unspecified: M16.9

## 2012-12-24 LAB — CBC WITH DIFFERENTIAL/PLATELET
Basophils Relative: 1 % (ref 0–1)
HCT: 37.4 % — ABNORMAL LOW (ref 39.0–52.0)
Hemoglobin: 13 g/dL (ref 13.0–17.0)
Lymphocytes Relative: 34 % (ref 12–46)
MCHC: 34.8 g/dL (ref 30.0–36.0)
MCV: 91 fL (ref 78.0–100.0)
Monocytes Absolute: 0.8 10*3/uL (ref 0.1–1.0)
Monocytes Relative: 10 % (ref 3–12)
Neutro Abs: 3.8 10*3/uL (ref 1.7–7.7)

## 2012-12-24 MED ORDER — TRAMADOL HCL 50 MG PO TABS: 50.0000 mg | ORAL_TABLET | Freq: Three times a day (TID) | ORAL | Status: DC | PRN

## 2012-12-24 NOTE — Assessment & Plan Note (Addendum)
Likely hemorrhoidal bleed. Resolved.  Hemorrhoidal banding planned by GI. Check follow up CBC.

## 2012-12-24 NOTE — Patient Instructions (Addendum)
Please complete your blood work prior to leaving.  Follow up in June as scheduled.

## 2012-12-24 NOTE — Assessment & Plan Note (Signed)
Ortho planning THR.

## 2012-12-24 NOTE — Assessment & Plan Note (Signed)
  BP Readings from Last 3 Encounters:  12/24/12 152/84  12/12/12 146/73  11/25/12 162/80  130's/70's at home per pt.  Continue current meds, monitor.  Likely component of white coat hypertension.

## 2012-12-24 NOTE — Progress Notes (Signed)
Subjective:    Patient ID: [name] Pratt, male    DOB: Apr 29, 1941, 72 y.o.   MRN: [id number]  HPI  [name] Pratt is a 72 yr old male who presents today for hospital follow up.  Records are reviewed.  He was admitted on 4/2-12/14/12 with [facility].  He remained hemodynamically stable in the hospital and was instructed to follow up with previously scheduled colonoscopy with Dr. Kinnie Scales on 4/15. Since returning home he has not seen any further [facility].  Per pt he completed the colonoscopy yesterday and was told that he has + hemorrhoids on colo.  He tells me that Dr. Kinnie Scales plans to band these hemorrhoids at a later date.   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   Review of Systems See HPI  Past Medical History  Diagnosis Date  . Hyperlipidemia   . Hypertension   . Chest pain   . Nephrolithiasis   . Hiatal hernia   . Ileus   . Inguinal hernia   . Pancreatitis   . Rosacea   . Gout   . Arthritis   . History of chicken pox   . GERD (gastroesophageal reflux disease)   . Thyroid disease     hypothyroidism  . History of hemorrhoids   . OSA (obstructive sleep apnea)     History   Social History  . Marital Status: Married    Spouse Name: N/A    Number of Children: 1  . 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 Smoking status: Former Smoker -- 11 years    Types: Cigarettes    Quit date: 09/11/1978  . Smokeless tobacco: Never Used  . Alcohol Use: 0.6 oz/week    1 Glasses of wine per week     Comment: 1 per month  . Drug Use: Not on file  . Sexually Active: Not on file   Other Topics Concern  . Not on file   Social History Narrative   Lives with his wife   He has one son- lives locally.   2 grandchildren   Has worked for fire department   Part time pain business   Enjoys wood working   Completed HS.  [employer]    Past Surgical History  Procedure Laterality Date  . Laparoscopic  cholecystectomy  09/20/06    with intraoperative cholangiogram and right inguinal herniorrhaphy with mesh   . Hiatal hernia repair  12/21/93  . Knee surgery  1995  . Hemorrhoid surgery    . Esophageal dilation    . Lithotripsy    . Inguinal hernia repair Right 07/15/12    Dr Ashley Jacobs  . Abdominal hernia repair  07/15/12    Dr Ashley Jacobs  . Nasal septum surgery  1975  . Esophageal dilation  1993 and 1994  . Brow lift  05/07/01  . Eye surgery Left 03/25/10    cataract removal  . Urethral dilation  1991    Dr. Annabell Howells  . Epidural injections      multiple procedures  . Joint replacement  07/25/04    right knee  . Joint replacement  07/13/10    left    Family History  Problem Relation Age of Onset  . Hypertension    . Cancer    . Osteoarthritis      Allergies  Allergen Reactions  . Metoclopramide Hcl  anxiety    Current Outpatient Prescriptions on File Prior to Visit  Medication Sig Dispense Refill  . albuterol (PROVENTIL HFA;VENTOLIN HFA) 108 (90 BASE) MCG/ACT inhaler Inhale 2 puffs into the lungs every 6 (six) hours as needed for shortness of breath.      . allopurinol (ZYLOPRIM) 100 MG tablet Take 100 mg by mouth daily.        [name] allopurinol (ZYLOPRIM) 300 MG tablet Take 300 mg by mouth daily.        [name] amLODipine (NORVASC) 10 MG tablet Take 10 mg by mouth daily.       . cephALEXin (KEFLEX) 500 MG capsule Take 2,000 mg by mouth daily as needed (priir to dental procedures).      . cycloSPORINE (RESTASIS) 0.05 % ophthalmic emulsion Place 1 drop into both eyes 2 (two) times daily.      . diclofenac sodium (VOLTAREN) 1 % GEL Apply 1 application topically daily as needed (arthritis).      [name] gemfibrozil (LOPID) 600 MG tablet Take 300 mg by mouth 2 (two) times daily before a meal.      . levothyroxine (SYNTHROID, LEVOTHROID) 137 MCG tablet Take 137 mcg by mouth daily.        [name] losartan (COZAAR) 100 MG tablet Take 100 mg by mouth daily.        . methyldopa (ALDOMET) 250 MG tablet Take 250  mg by mouth 2 (two) times daily.      . metroNIDAZOLE (METROCREAM) 0.75 % cream Apply 1 application topically daily as needed (rosacia).      . multivitamin (THERAGRAN) per tablet Take 1 tablet by mouth daily.        [name] omeprazole (PRILOSEC) 20 MG capsule Take 20 mg by mouth every morning.      [name] OVER THE COUNTER MEDICATION Take 1 tablet by mouth daily as needed. Allergy and decongestant pill      . OVER THE COUNTER MEDICATION Place 1 application into both eyes daily as needed (dry eyes). No Tears Eye Ointment      . simvastatin (ZOCOR) 20 MG tablet Take 20 mg by mouth at bedtime.        . sodium chloride (OCEAN) 0.65 % nasal spray Place 1 spray into both nostrils at bedtime as needed for congestion.      . IRON PO Take 1 tablet by mouth every evening.      . Omega-3 Fatty Acids (FISH OIL) 1200 MG CAPS Take 1,200 mg by mouth 2 (two) times daily.       No current facility-administered medications on file prior to visit.    BP 152/84  Pulse 75  Temp(Src) 97.7 F (36.5 C) (Oral)  Ht 5\' 7"  (1.702 m)  Wt 180 lb 1.3 oz (81.684 kg)  BMI 28.2 kg/m2  SpO2 94%       Objective:   Physical Exam  Constitutional: He is oriented to person, place, and time. He appears well-developed and well-nourished. No distress.  Cardiovascular: Normal rate and regular rhythm.   No murmur heard. Pulmonary/Chest: Effort normal and breath sounds normal. No respiratory distress. He has no wheezes. He has no rales. He exhibits no tenderness.  Neurological: He is alert and oriented to person, place, and time.  Psychiatric: He has a normal mood and affect. His behavior is normal. Judgment and thought content normal.          Assessment & Plan:

## 2012-12-24 NOTE — Assessment & Plan Note (Signed)
>>  ASSESSMENT AND PLAN FOR BENIGN ESSENTIAL HYPERTENSION WRITTEN ON 12/24/2012  3:15 PM BY O'SULLIVAN, Jyla Hopf, NP   BP Readings from Last 3 Encounters:  12/24/12 152/84  12/12/12 146/73  11/25/12 162/80  130's/70's at home per pt.  Continue current meds, monitor.  Likely component of white coat hypertension.

## 2013-01-02 ENCOUNTER — Telehealth: Payer: Self-pay | Admitting: *Deleted

## 2013-01-02 NOTE — Telephone Encounter (Signed)
Please call pt and arrange a preop visit for his hip surgery.

## 2013-01-02 NOTE — Telephone Encounter (Signed)
Received message from [facility] at Dr Peele's office wanting to know the status of medical clearance for pt's upcoming right total hip replacement. Do not see receipt of clearance form. Requested form be refaxed to [place]. Completed form needs to be faxed back to [phone].

## 2013-01-02 NOTE — Telephone Encounter (Signed)
Spoke to pt and scheduled f/u for Monday, 4/28 at 3:15pm.

## 2013-01-06 ENCOUNTER — Ambulatory Visit (INDEPENDENT_AMBULATORY_CARE_PROVIDER_SITE_OTHER): Payer: Medicare Other | Admitting: Family

## 2013-01-06 ENCOUNTER — Telehealth: Payer: Self-pay | Admitting: *Deleted

## 2013-01-06 ENCOUNTER — Encounter: Payer: Self-pay | Admitting: Family

## 2013-01-06 VITALS — BP 138/80 | HR 60 | Temp 97.8°F | Resp 16 | Wt 178.0 lb

## 2013-01-06 DIAGNOSIS — Z01818 Encounter for other preprocedural examination: Secondary | ICD-10-CM

## 2013-01-06 DIAGNOSIS — R9431 Abnormal electrocardiogram [ECG] [EKG]: Secondary | ICD-10-CM

## 2013-01-06 DIAGNOSIS — M169 Osteoarthritis of hip, unspecified: Secondary | ICD-10-CM

## 2013-01-06 HISTORY — DX: Abnormal electrocardiogram (ECG) (EKG): R94.31

## 2013-01-06 MED ORDER — HYDROCODONE-ACETAMINOPHEN 5-325 MG PO TABS: 1.0000 | ORAL_TABLET | Freq: Four times a day (QID) | ORAL | Status: DC | PRN

## 2013-01-06 NOTE — Assessment & Plan Note (Signed)
Requested reports from lab work from his ortho.  Reviewed reports.  Lab work looks good.

## 2013-01-06 NOTE — Telephone Encounter (Signed)
Pt presented to the office for surgical clearance for hip replacement scheduled for tomorrow. Per Provider, EKG requires cardiac clearance. Spoke with Marlowe Kays at [facility] ([facility] [phone] and advised her of postponement of surgery pending cardiac clearance. Advised pt to call Lawson Fiscal once his cardiac consultation has been scheduled and he voices understanding.

## 2013-01-06 NOTE — Patient Instructions (Addendum)
We will contact you about your appointment with Dr. [name].

## 2013-01-06 NOTE — Progress Notes (Signed)
Subjective:    Patient ID: [name] Pratt, male    DOB: [birth year], 73 y.o.   MRN: [id number]  HPI  [name] Pratt is a 72 yr old male who presents this afternoon for surgical clearance for his upcoming THR.  His surgery is scheduled for tomorrow morning.  He reports that he has had a presurgical evaluation through his orthopedic surgeon.  He denies chest pain, shortness of breath, LE edema.  He does report significant hip pain which is not significantly helped by the tramadol we gave him last visit.   Review of Systems  HENT: Negative for congestion.   Respiratory: Negative for cough and choking.   Cardiovascular: Negative for chest pain and leg swelling.  Gastrointestinal: Negative for vomiting, diarrhea and constipation.   See HPI  Past Medical History  Diagnosis Date  . Hyperlipidemia   . Hypertension   . Chest pain   . Nephrolithiasis   . Hiatal hernia   . Ileus   . Inguinal hernia   . Pancreatitis   . Rosacea   . Gout   . Arthritis   . History of chicken pox   . GERD (gastroesophageal reflux disease)   . Thyroid disease     hypothyroidism  . History of hemorrhoids   . OSA (obstructive sleep apnea)     History   Social History  . Marital Status: Married    Spouse Name: N/A    Number of Children: 1  . 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 Smoking status: Former Smoker -- 11 years    Types: Cigarettes    Quit date: 09/11/1978  . Smokeless tobacco: Never Used  . Alcohol Use: 0.6 oz/week    1 Glasses of wine per week     Comment: 1 per month  . Drug Use: Not on file  . Sexually Active: Not on file   Other Topics Concern  . Not on file   Social History Narrative   Lives with his wife   He has one son- lives locally.   2 grandchildren   Has worked for fire department   Part time pain business   Enjoys wood working   Completed HS.  [employer]    Past Surgical  History  Procedure Laterality Date  . Laparoscopic cholecystectomy  09/20/06    with intraoperative cholangiogram and right inguinal herniorrhaphy with mesh   . Hiatal hernia repair  12/21/93  . Knee surgery  1995  . Hemorrhoid surgery    . Esophageal dilation    . Lithotripsy    . Inguinal hernia repair Right 07/15/12    Dr Ashley Jacobs  . Abdominal hernia repair  07/15/12    Dr Ashley Jacobs  . Nasal septum surgery  1975  . Esophageal dilation  1993 and 1994  . Brow lift  05/07/01  . Eye surgery Left 03/25/10    cataract removal  . Urethral dilation  1991    Dr. Annabell Howells  . Epidural injections      multiple procedures  . Joint replacement  07/25/04    right knee  . Joint replacement  07/13/10    left    Family History  Problem Relation Age of Onset  . Hypertension    . Cancer    . Osteoarthritis      Allergies  Allergen Reactions  . Metoclopramide Hcl     anxiety  Current Outpatient Prescriptions on File Prior to Visit  Medication Sig Dispense Refill  . albuterol (PROVENTIL HFA;VENTOLIN HFA) 108 (90 BASE) MCG/ACT inhaler Inhale 2 puffs into the lungs every 6 (six) hours as needed for shortness of breath.      . allopurinol (ZYLOPRIM) 100 MG tablet Take 100 mg by mouth daily.        [name] allopurinol (ZYLOPRIM) 300 MG tablet Take 300 mg by mouth daily.        [name] amLODipine (NORVASC) 10 MG tablet Take 10 mg by mouth daily.       . cephALEXin (KEFLEX) 500 MG capsule Take 2,000 mg by mouth daily as needed (priir to dental procedures).      . cycloSPORINE (RESTASIS) 0.05 % ophthalmic emulsion Place 1 drop into both eyes 2 (two) times daily.      . diclofenac sodium (VOLTAREN) 1 % GEL Apply 1 application topically daily as needed (arthritis).      [name] gemfibrozil (LOPID) 600 MG tablet Take 300 mg by mouth 2 (two) times daily before a meal.      . levothyroxine (SYNTHROID, LEVOTHROID) 137 MCG tablet Take 137 mcg by mouth daily.        [name] losartan (COZAAR) 100 MG tablet Take 100 mg by mouth daily.         . methyldopa (ALDOMET) 250 MG tablet Take 250 mg by mouth 2 (two) times daily.      . metroNIDAZOLE (METROCREAM) 0.75 % cream Apply 1 application topically daily as needed (rosacia).      . multivitamin (THERAGRAN) per tablet Take 1 tablet by mouth daily.        [name] omeprazole (PRILOSEC) 20 MG capsule Take 20 mg by mouth every morning.      [name] OVER THE COUNTER MEDICATION Take 1 tablet by mouth daily as needed. Allergy and decongestant pill      . OVER THE COUNTER MEDICATION Place 1 application into both eyes daily as needed (dry eyes). No Tears Eye Ointment      . simvastatin (ZOCOR) 20 MG tablet Take 20 mg by mouth at bedtime.        . sodium chloride (OCEAN) 0.65 % nasal spray Place 1 spray into both nostrils at bedtime as needed for congestion.      . traMADol (ULTRAM) 50 MG tablet Take 1 tablet (50 mg total) by mouth every 8 (eight) hours as needed for pain.  30 tablet  0   No current facility-administered medications on file prior to visit.    BP 138/80  Pulse 60  Temp(Src) 97.8 F (36.6 C)  Resp 16  Wt 178 lb (80.74 kg)  BMI 27.87 kg/m2  SpO2 95%       Objective:   Physical Exam        Assessment & Plan:

## 2013-01-06 NOTE — Assessment & Plan Note (Addendum)
EKG performed today as well as EKG at his ortho office notes new Left anterior fascicular block.  Unfortunately, he will need to have a cardiac clearance prior to surgery.  Will arrange.  His orthopedic surgeon's office was notified re: need to postpone procedure pending cardiac clearance. Case was reviewed with Dr. Abner Greenspan and she agrees that pt needs cardiac clearance.

## 2013-01-08 ENCOUNTER — Encounter: Payer: Self-pay | Admitting: Physician Assistant

## 2013-01-08 ENCOUNTER — Ambulatory Visit (INDEPENDENT_AMBULATORY_CARE_PROVIDER_SITE_OTHER): Payer: Medicare Other | Admitting: Physician Assistant

## 2013-01-08 VITALS — BP 148/82 | HR 66 | Ht 67.0 in | Wt 179.8 lb

## 2013-01-08 DIAGNOSIS — E785 Hyperlipidemia, unspecified: Secondary | ICD-10-CM

## 2013-01-08 DIAGNOSIS — I6529 Occlusion and stenosis of unspecified carotid artery: Secondary | ICD-10-CM

## 2013-01-08 DIAGNOSIS — Z01810 Encounter for preprocedural cardiovascular examination: Secondary | ICD-10-CM

## 2013-01-08 DIAGNOSIS — I10 Essential (primary) hypertension: Secondary | ICD-10-CM

## 2013-01-08 NOTE — Progress Notes (Signed)
[address] [place], [place]  [address] Phone: [phone] Fax:  [phone]  Date:  01/08/2013   ID:  [name] Pratt, DOB 24-Dec-1940, MRN [id number]  PCP:  Lemont Fillers., NP  Primary Cardiologist:  Dr. Charlton Haws     History of Present Illness: [name] Pratt is a 72 y.o. male who returns for surgical clearance. He has a hx of HTN, HL, carotid stenosis, sleep apnea, GERD, hypothyroidism. Myoview in 2008 was normal.  Echo 7/09: EF 60%, normal wall motion, mild LVH, mild LAE. Carotid U/S 5/13: LICA < 50%, RICA 50-69%.  He needs a R THR with Dr. Marcial Pacas Still in [place].  Surgery was scheduled for yesterday but he was felt to have an abnormal ECG and was referred here by his PCP for further evaluation.  He is limited by his hip pain.  He may be able to achieve 4 METs.  He notes dyspnea with more moderate to extreme activities.  He is likely NYHA Class II-IIb.  No chest pain.  No syncope.  No orthopnea, PND.  He has mild ankle edema.    Labs (4/14):  K 3.7, Cr 1.2, Hgb 13, TSH 1.872  Wt Readings from Last 3 Encounters:  01/08/13 179 lb 12.8 oz (81.557 kg)  01/06/13 178 lb (80.74 kg)  12/24/12 180 lb 1.3 oz (81.684 kg)     Past Medical History  Diagnosis Date  . Hyperlipidemia   . Hypertension   . Chest pain      Myoview in 2008 was normal.  Echo 7/09: EF 60%, normal wall motion, mild LVH, mild LAE.  [name] Nephrolithiasis   . Hiatal hernia   . Ileus   . Inguinal hernia   . Pancreatitis   . Rosacea   . Gout   . Arthritis   . History of chicken pox   . GERD (gastroesophageal reflux disease)   . Thyroid disease     hypothyroidism  . History of hemorrhoids   . OSA (obstructive sleep apnea)   . Carotid stenosis     Carotid U/S 5/13: LICA < 50%, RICA 50-69%    Current Outpatient Prescriptions  Medication Sig Dispense Refill  . albuterol (PROVENTIL HFA;VENTOLIN HFA) 108 (90 BASE) MCG/ACT inhaler Inhale 2 puffs into the lungs every 6  (six) hours as needed for shortness of breath.      . allopurinol (ZYLOPRIM) 100 MG tablet Take 100 mg by mouth daily.        [name] amLODipine (NORVASC) 10 MG tablet Take 10 mg by mouth daily.       . cephALEXin (KEFLEX) 500 MG capsule Take 2,000 mg by mouth daily as needed (priir to dental procedures).      . cycloSPORINE (RESTASIS) 0.05 % ophthalmic emulsion Place 1 drop into both eyes 2 (two) times daily.      . diclofenac sodium (VOLTAREN) 1 % GEL Apply 1 application topically daily as needed (arthritis).      [name] gemfibrozil (LOPID) 600 MG tablet Take 300 mg by mouth 2 (two) times daily before a meal.      . HYDROcodone-acetaminophen (NORCO/VICODIN) 5-325 MG per tablet Take 1 tablet by mouth every 6 (six) hours as needed for pain.  30 tablet  0  . levothyroxine (SYNTHROID, LEVOTHROID) 137 MCG tablet Take 137 mcg by mouth daily.        [name] losartan (COZAAR) 100 MG tablet Take 100 mg by mouth daily.        [name]  methyldopa (ALDOMET) 250 MG tablet Take 250 mg by mouth 2 (two) times daily.      . metroNIDAZOLE (METROCREAM) 0.75 % cream Apply 1 application topically daily as needed (rosacia).      . multivitamin (THERAGRAN) per tablet Take 1 tablet by mouth daily.        [name] omeprazole (PRILOSEC) 20 MG capsule Take 20 mg by mouth every morning.      [name] OVER THE COUNTER MEDICATION Take 1 tablet by mouth daily as needed. Allergy and decongestant pill      . OVER THE COUNTER MEDICATION Place 1 application into both eyes daily as needed (dry eyes). No Tears Eye Ointment      . simvastatin (ZOCOR) 20 MG tablet Take 20 mg by mouth at bedtime.        . sodium chloride (OCEAN) 0.65 % nasal spray Place 1 spray into both nostrils at bedtime as needed for congestion.      . traMADol (ULTRAM) 50 MG tablet Take 1 tablet (50 mg total) by mouth every 8 (eight) hours as needed for pain.  30 tablet  0  . allopurinol (ZYLOPRIM) 300 MG tablet Take 300 mg by mouth daily.         No current facility-administered medications for  this visit.    Allergies:    Allergies  Allergen Reactions  . Metoclopramide Hcl     anxiety    Social History:  The patient  reports that he quit smoking about 34 years ago. His smoking use included Cigarettes. He smoked 0.00 packs per day for 11 years. He has never used smokeless tobacco. He reports that he drinks about 0.6 ounces of alcohol per week.   ROS:  Please see the history of present illness.   He notes hx of hemorrhoidal bleeding some months ago.  Also notes some diarrhea.  All other systems reviewed and negative.   PHYSICAL EXAM: VS:  BP 148/82  Pulse 66  Ht 5\' 7"  (1.702 m)  Wt 179 lb 12.8 oz (81.557 kg)  BMI 28.15 kg/m2 Well nourished, well developed, in no acute distress HEENT: normal Neck: no JVD Cardiac:  normal S1, S2; RRR; no murmur Lungs:  clear to auscultation bilaterally, no wheezing, rhonchi or rales Abd: soft, nontender, no hepatomegaly Ext: no edema Skin: warm and dry Neuro:  CNs 2-12 intact, no focal abnormalities noted  EKG:  NSR, HR 66, LAD, IVCD, NSSTTW changes     ASSESSMENT AND PLAN:  1. Surgical Clearance:  He does not have any unstable cardiac conditions.  His THR is an intermediate risk procedure.  He has only one clinically significant risk factor (carotid stenosis).  According to ACC/AHA guidelines, he requires not further testing prior to proceeding with his non-cardiac procedure.  He should be at acceptable risk.  He is not on a beta blocker.  I considered starting him on this to reduce the risk of CV complications.  However, he notes a hx of post op hypotension (vs bradycardia) in the setting of beta blockers requiring their discontinuation.  He thinks he was on atenolol.  Therefore, I will not start a beta blocker.  2. Carotid Stenosis:  Arrange f/u dopplers.  3. Hypertension:  Fair control.  Continue current Rx.  4. Hyperlipidemia:  Continue statin. 5. Disposition:  F/u with Dr. Charlton Haws prn.    [name], PA-C  11:36 AM  01/08/2013

## 2013-01-08 NOTE — Patient Instructions (Addendum)
START ASPIRIN 81 MG DAILY AFTER YOUR SURGERY ONCE YOUR SURGEON SAYS IT IS OK  CAROTID DUPLEX TO BE DONE AFTER PT HAS HIS SURGERY DX CAROTID STENOSIS BILATERAL  FOLLOW UP WITH DR. [name] AS NEEDED

## 2013-01-09 ENCOUNTER — Telehealth: Payer: Self-pay | Admitting: Family

## 2013-01-09 DIAGNOSIS — Z01818 Encounter for other preprocedural examination: Secondary | ICD-10-CM

## 2013-01-09 NOTE — Telephone Encounter (Signed)
Patient  Saw Cardiology yesterday   Want to know about his clearance for surgery  Please call pt      [phone]

## 2013-01-10 ENCOUNTER — Telehealth: Payer: Self-pay | Admitting: Family

## 2013-01-10 ENCOUNTER — Ambulatory Visit (HOSPITAL_BASED_OUTPATIENT_CLINIC_OR_DEPARTMENT_OTHER)
Admission: RE | Admit: 2013-01-10 | Discharge: 2013-01-10 | Disposition: A | Discharge status: Home / Self Care | Payer: Medicare Other | Source: Ambulatory Visit | Attending: Family | Admitting: Family

## 2013-01-10 DIAGNOSIS — J4489 Other specified chronic obstructive pulmonary disease: Secondary | ICD-10-CM | POA: Insufficient documentation

## 2013-01-10 DIAGNOSIS — Z01818 Encounter for other preprocedural examination: Secondary | ICD-10-CM | POA: Insufficient documentation

## 2013-01-10 DIAGNOSIS — J449 Chronic obstructive pulmonary disease, unspecified: Secondary | ICD-10-CM | POA: Insufficient documentation

## 2013-01-10 NOTE — Telephone Encounter (Signed)
Please call pt and let him know that I reviewed cardiology note- they have cleared him for surgery.  The only thing left to do is to check a CXR and I have placed order.  He can do that this afternoon and then we can fax clearance to his orthopedist.

## 2013-01-10 NOTE — Telephone Encounter (Signed)
I have reviewed CXR and have cleared him for surgery.  Please fax clearance to his surgeon.

## 2013-01-10 NOTE — Telephone Encounter (Signed)
Notified pt and he voices understanding. 

## 2013-01-13 NOTE — Telephone Encounter (Signed)
Clearance was faxed on Friday at 6:15pm to [phone].

## 2013-01-15 ENCOUNTER — Encounter: Payer: Self-pay | Admitting: Family

## 2013-01-20 ENCOUNTER — Telehealth: Payer: Self-pay | Admitting: Family

## 2013-01-20 MED ORDER — LOSARTAN POTASSIUM 100 MG PO TABS: 100.0000 mg | ORAL_TABLET | Freq: Every day | ORAL | Status: DC

## 2013-01-20 NOTE — Telephone Encounter (Signed)
Rx sent in to pharmacy. 

## 2013-01-20 NOTE — Telephone Encounter (Signed)
Refill- losartan 100mg  tab. Take one tablet by mouth every day. Qty 90 last fill 1.27.14

## 2013-01-23 ENCOUNTER — Telehealth: Payer: Self-pay | Admitting: *Deleted

## 2013-01-23 NOTE — Telephone Encounter (Signed)
Received fax from [place] at [facility] requesting surgical clearance for pt's upcoming hip replacement on 01/29/13. Attempted to reach Lawson Fiscal and left message on her voicemail that surgical clearance was previously faxed around 01/10/13, do they need a new clearance signed and faxed?

## 2013-01-24 ENCOUNTER — Other Ambulatory Visit: Payer: Self-pay | Admitting: Family

## 2013-01-24 NOTE — Telephone Encounter (Signed)
Signed.

## 2013-01-24 NOTE — Telephone Encounter (Signed)
Per Lawson Fiscal at [facility], they do need a new clearance signed and faxed back to them.

## 2013-01-28 ENCOUNTER — Other Ambulatory Visit: Payer: Self-pay | Admitting: Family

## 2013-01-28 NOTE — Telephone Encounter (Signed)
Form faxed to Attn: Marlowe Kays @ [phone] on 01/27/13 at 5:55pm.

## 2013-01-29 NOTE — Telephone Encounter (Signed)
Please advise refill? Last office visit 01-06-13. Last RX wrote on 01-06-13 quantity 30 with 0 refills.  If ok fax to [phone]

## 2013-01-29 NOTE — Telephone Encounter (Signed)
Ok to send vicodin 5/325 #30 no refills.

## 2013-01-29 NOTE — Telephone Encounter (Signed)
Already sent to [facility]

## 2013-01-30 MED ORDER — HYDROCODONE-ACETAMINOPHEN 5-325 MG PO TABS: 1.0000 | ORAL_TABLET | Freq: Four times a day (QID) | ORAL | Status: DC | PRN

## 2013-01-30 NOTE — Telephone Encounter (Signed)
Rx left on pharmacy voicemail.

## 2013-02-04 ENCOUNTER — Encounter (INDEPENDENT_AMBULATORY_CARE_PROVIDER_SITE_OTHER): Payer: Medicare Other

## 2013-02-04 ENCOUNTER — Other Ambulatory Visit (HOSPITAL_COMMUNITY): Payer: Medicare Other

## 2013-02-04 DIAGNOSIS — I6529 Occlusion and stenosis of unspecified carotid artery: Secondary | ICD-10-CM

## 2013-02-06 ENCOUNTER — Encounter: Payer: Self-pay | Admitting: Physician Assistant

## 2013-02-06 ENCOUNTER — Telehealth: Payer: Self-pay | Admitting: *Deleted

## 2013-02-06 NOTE — Telephone Encounter (Signed)
wife notified about carotid results with verbal understanding

## 2013-02-06 NOTE — Telephone Encounter (Signed)
Message copied by Tarri Fuller on Thu Feb 06, 2013  3:29 PM ------      Message from: [place], [place] T      Created: Thu Feb 06, 2013  2:04 PM       Stable mild to moderate carotid plaque      Repeat dopplers in 1 year      Tereso Newcomer, [place]        02/06/2013 2:03 PM ------

## 2013-02-21 ENCOUNTER — Telehealth: Payer: Self-pay | Admitting: *Deleted

## 2013-02-21 NOTE — Telephone Encounter (Signed)
Received message from pt wanting to know if he should be fasting at next appt or complete labs prior to appt. Left message on home# to be fasting and orders will be given to him at his follow up.

## 2013-02-24 ENCOUNTER — Encounter: Payer: Self-pay | Admitting: Family

## 2013-02-24 ENCOUNTER — Ambulatory Visit (INDEPENDENT_AMBULATORY_CARE_PROVIDER_SITE_OTHER): Payer: Medicare Other | Admitting: Family

## 2013-02-24 VITALS — BP 122/60 | HR 70 | Temp 97.8°F | Wt 164.0 lb

## 2013-02-24 DIAGNOSIS — M109 Gout, unspecified: Secondary | ICD-10-CM

## 2013-02-24 DIAGNOSIS — K922 Gastrointestinal hemorrhage, unspecified: Secondary | ICD-10-CM

## 2013-02-24 DIAGNOSIS — M169 Osteoarthritis of hip, unspecified: Secondary | ICD-10-CM

## 2013-02-24 DIAGNOSIS — I6529 Occlusion and stenosis of unspecified carotid artery: Secondary | ICD-10-CM

## 2013-02-24 DIAGNOSIS — I6521 Occlusion and stenosis of right carotid artery: Secondary | ICD-10-CM

## 2013-02-24 DIAGNOSIS — E785 Hyperlipidemia, unspecified: Secondary | ICD-10-CM

## 2013-02-24 DIAGNOSIS — I10 Essential (primary) hypertension: Secondary | ICD-10-CM

## 2013-02-24 NOTE — Assessment & Plan Note (Signed)
Per pt he underwent colo and banding of hemorrhoids.  Denies further rectal bleeding since procedure. Monitor.

## 2013-02-24 NOTE — Assessment & Plan Note (Signed)
S/P THA clinically stable.  Management per ortho.

## 2013-02-24 NOTE — Progress Notes (Signed)
Subjective:    Patient ID: [name] Pratt, male    DOB: 1941/02/04, 72 y.o.   MRN: [id number]  HPI  [name] Pratt is a 72 yr old male who presents today for follow up of multiple medical problems.  1) HTN-  Currently on amlodipine,losartan and aldomet. Denies CP/SOB or swelling.    2) Hyperlipidemia-  Currently maintained on on lopid, simvastatin.    3) Gout- continues allopurinol 400mg  once daily.   4) DJD- s/p R THA.    5) rectal bleeding- s/p colo- told that he had internal hemorrhoids and had hemorrhoid, banded per Dr. Kinnie Scales. Review of Systems    see HPI  Past Medical History  Diagnosis Date  . Hyperlipidemia   . Hypertension   . Chest pain      Myoview in 2008 was normal.  Echo 7/09: EF 60%, normal wall motion, mild LVH, mild LAE.  [name] Nephrolithiasis   . Hiatal hernia   . Ileus   . Inguinal hernia   . Pancreatitis   . Rosacea   . Gout   . Arthritis   . History of chicken pox   . GERD (gastroesophageal reflux disease)   . Thyroid disease     hypothyroidism  . History of hemorrhoids   . OSA (obstructive sleep apnea)   . Carotid stenosis     a. Carotid U/S 5/13: LICA < 50%, RICA 50-69%;  b.  Carotid U/S 5/14:  RICA 40-59%; LICA 0-39%; f/u 1 year    History   Social History  . Marital Status: Married    Spouse Name: N/A    Number of Children: 1  . 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 Smoking status: Former Smoker -- 11 years    Types: Cigarettes    Quit date: 09/11/1978  . Smokeless tobacco: Never Used  . Alcohol Use: 0.6 oz/week    1 Glasses of wine per week     Comment: 1 per month  . Drug Use: Not on file  . Sexually Active: Not on file   Other Topics Concern  . Not on file   Social History Narrative   Lives with his wife   He has one son- lives locally.   2 grandchildren   Has worked for fire department   Part time pain business   Enjoys wood working   Completed HS.  [employer]    Past Surgical History  Procedure Laterality Date  . Laparoscopic cholecystectomy  09/20/06    with intraoperative cholangiogram and right inguinal herniorrhaphy with mesh   . Hiatal hernia repair  12/21/93  . Knee surgery  1995  . Hemorrhoid surgery    . Esophageal dilation    . Lithotripsy    . Inguinal hernia repair Right 07/15/12    Dr Ashley Jacobs  . Abdominal hernia repair  07/15/12    Dr Ashley Jacobs  . Nasal septum surgery  1975  . Esophageal dilation  1993 and 1994  . Brow lift  05/07/01  . Eye surgery Left 03/25/10    cataract removal  . Urethral dilation  1991    Dr. Annabell Howells  . Epidural injections      multiple procedures  . Joint replacement  07/25/04    right knee  . Joint replacement  07/13/10    left    Family History  Problem Relation Age of Onset  .  Hypertension    . Cancer    . Osteoarthritis      Allergies  Allergen Reactions  . Metoclopramide Hcl     anxiety    Current Outpatient Prescriptions on File Prior to Visit  Medication Sig Dispense Refill  . albuterol (PROVENTIL HFA;VENTOLIN HFA) 108 (90 BASE) MCG/ACT inhaler Inhale 2 puffs into the lungs every 6 (six) hours as needed for shortness of breath.      . allopurinol (ZYLOPRIM) 100 MG tablet Take 100 mg by mouth daily.        [name] allopurinol (ZYLOPRIM) 300 MG tablet Take 300 mg by mouth daily.        [name] amLODipine (NORVASC) 10 MG tablet Take 10 mg by mouth daily.       . cephALEXin (KEFLEX) 500 MG capsule Take 2,000 mg by mouth daily as needed (priir to dental procedures).      . cycloSPORINE (RESTASIS) 0.05 % ophthalmic emulsion Place 1 drop into both eyes 2 (two) times daily.      . diclofenac sodium (VOLTAREN) 1 % GEL Apply 1 application topically daily as needed (arthritis).      [name] gemfibrozil (LOPID) 600 MG tablet Take 300 mg by mouth 2 (two) times daily before a meal.      . HYDROcodone-acetaminophen (NORCO/VICODIN) 5-325 MG per tablet Take 1 tablet by mouth every 6 (six) hours  as needed for pain.  30 tablet  0  . levothyroxine (SYNTHROID, LEVOTHROID) 137 MCG tablet Take 137 mcg by mouth daily.        [name] losartan (COZAAR) 100 MG tablet Take 1 tablet (100 mg total) by mouth daily.  30 tablet  5  . methyldopa (ALDOMET) 250 MG tablet Take 250 mg by mouth 2 (two) times daily.      . metroNIDAZOLE (METROCREAM) 0.75 % cream Apply 1 application topically daily as needed (rosacia).      . multivitamin (THERAGRAN) per tablet Take 1 tablet by mouth daily.        [name] omeprazole (PRILOSEC) 20 MG capsule Take 20 mg by mouth every morning.      [name] OVER THE COUNTER MEDICATION Take 1 tablet by mouth daily as needed. Allergy and decongestant pill      . OVER THE COUNTER MEDICATION Place 1 application into both eyes daily as needed (dry eyes). No Tears Eye Ointment      . simvastatin (ZOCOR) 20 MG tablet Take 20 mg by mouth at bedtime.        . sodium chloride (OCEAN) 0.65 % nasal spray Place 1 spray into both nostrils at bedtime as needed for congestion.       No current facility-administered medications on file prior to visit.    BP 122/60  Pulse 70  Temp(Src) 97.8 F (36.6 C) (Oral)  Wt 164 lb (74.39 kg)  BMI 25.68 kg/m2  SpO2 97%    Objective:   Physical Exam  Constitutional: He appears well-developed and well-nourished. No distress.  Cardiovascular: Normal rate and regular rhythm.   No murmur heard. Pulmonary/Chest: Effort normal and breath sounds normal. No respiratory distress. He has no wheezes. He has no rales. He exhibits no tenderness.  Musculoskeletal: He exhibits no edema.  Neurological: He is alert.  Skin:  Right hip incision, clean, dry and intact. Steri strips intact.   Psychiatric: He has a normal mood and affect. His behavior is normal. Judgment and thought content normal.          Assessment & Plan:

## 2013-02-24 NOTE — Assessment & Plan Note (Signed)
LDL stable at 96, continue lopid and simvastatin.

## 2013-02-24 NOTE — Assessment & Plan Note (Signed)
Stable on allopurinol.  Continue same.  

## 2013-02-24 NOTE — Assessment & Plan Note (Signed)
BP Readings from Last 3 Encounters:  02/24/13 122/60  01/08/13 148/82  01/06/13 138/80   BP looks good, continue losartan, amlodipine, aldomet.

## 2013-02-24 NOTE — Assessment & Plan Note (Signed)
He is back on aspirin per Dr. Excell Seltzer.  I instructed him to continue.

## 2013-02-24 NOTE — Assessment & Plan Note (Signed)
>>  ASSESSMENT AND PLAN FOR BENIGN ESSENTIAL HYPERTENSION WRITTEN ON 02/24/2013  5:05 PM BY O'SULLIVAN, Hephzibah Strehle, NP  BP Readings from Last 3 Encounters:  02/24/13 122/60  01/08/13 148/82  01/06/13 138/80   BP looks good, continue losartan, amlodipine, aldomet.

## 2013-02-24 NOTE — Patient Instructions (Addendum)
Please schedule a follow up appointment in 3 months.

## 2013-03-11 ENCOUNTER — Telehealth: Payer: Self-pay | Admitting: Family

## 2013-03-11 MED ORDER — OMEPRAZOLE 20 MG PO CPDR
20.0000 mg | DELAYED_RELEASE_CAPSULE | Freq: Every morning | ORAL | Status: DC
Start: 2013-03-11 — End: 2013-05-26

## 2013-03-11 MED ORDER — ALLOPURINOL 300 MG PO TABS: 300.0000 mg | ORAL_TABLET | Freq: Every day | ORAL | Status: DC

## 2013-03-11 NOTE — Telephone Encounter (Signed)
Rx request to pharmacy/SLS  

## 2013-03-11 NOTE — Telephone Encounter (Signed)
Refill- allopurinol 300mg  tab. Take one tablet by mouth every day as directed. Qty 90 last fill 7.8.13  Refill- omeprazole 20mg  cap. Take one capsule by mouth every day. Qty 90 last fill 6.20.13

## 2013-03-31 ENCOUNTER — Telehealth: Payer: Self-pay | Admitting: Family

## 2013-03-31 NOTE — Telephone Encounter (Signed)
Refill-lopid 600mg  tab. Take one tablet by mouth twice daily one hour before meals. Qty 180 last fill 5.12.14

## 2013-04-01 MED ORDER — GEMFIBROZIL 600 MG PO TABS: 300.0000 mg | ORAL_TABLET | Freq: Two times a day (BID) | ORAL | Status: DC

## 2013-04-01 NOTE — Telephone Encounter (Signed)
Refill sent.

## 2013-04-03 ENCOUNTER — Other Ambulatory Visit: Payer: Self-pay | Admitting: *Deleted

## 2013-04-03 MED ORDER — SIMVASTATIN 20 MG PO TABS: 20.0000 mg | ORAL_TABLET | Freq: Every day | ORAL | Status: DC

## 2013-04-03 NOTE — Telephone Encounter (Signed)
Rx request to pharmacy/SLS  

## 2013-04-04 ENCOUNTER — Other Ambulatory Visit: Payer: Self-pay | Admitting: *Deleted

## 2013-04-04 MED ORDER — ALLOPURINOL 100 MG PO TABS: 100.0000 mg | ORAL_TABLET | Freq: Every day | ORAL | Status: DC

## 2013-04-04 NOTE — Telephone Encounter (Signed)
Rx request to pharmacy/SLS  

## 2013-04-08 ENCOUNTER — Other Ambulatory Visit: Payer: Self-pay | Admitting: *Deleted

## 2013-04-08 MED ORDER — AMLODIPINE BESYLATE 10 MG PO TABS: 10.0000 mg | ORAL_TABLET | Freq: Every day | ORAL | Status: DC

## 2013-04-08 NOTE — Telephone Encounter (Signed)
Rx request to pharmacy/SLS  

## 2013-04-08 NOTE — Telephone Encounter (Signed)
eRx transmission failed to pharmacy. Rx called to pharmacy voicemail, #90 x no refill.

## 2013-04-09 ENCOUNTER — Other Ambulatory Visit: Payer: Self-pay | Admitting: *Deleted

## 2013-04-09 MED ORDER — METHYLDOPA 250 MG PO TABS: 250.0000 mg | ORAL_TABLET | Freq: Two times a day (BID) | ORAL | Status: DC

## 2013-04-09 NOTE — Telephone Encounter (Signed)
Rx request to pharmacy/SLS  

## 2013-05-02 ENCOUNTER — Other Ambulatory Visit: Payer: Self-pay | Admitting: *Deleted

## 2013-05-02 MED ORDER — LEVOTHYROXINE SODIUM 137 MCG PO TABS: 137.0000 ug | ORAL_TABLET | Freq: Every day | ORAL | Status: DC

## 2013-05-02 NOTE — Telephone Encounter (Signed)
Rx request to pharmacy/SLS  

## 2013-05-26 ENCOUNTER — Encounter: Payer: Self-pay | Admitting: Family

## 2013-05-26 ENCOUNTER — Ambulatory Visit (INDEPENDENT_AMBULATORY_CARE_PROVIDER_SITE_OTHER): Payer: Medicare Other | Admitting: Family

## 2013-05-26 VITALS — BP 140/82 | HR 54 | Temp 97.9°F | Resp 16 | Ht 67.0 in | Wt 173.1 lb

## 2013-05-26 DIAGNOSIS — I10 Essential (primary) hypertension: Secondary | ICD-10-CM

## 2013-05-26 DIAGNOSIS — M109 Gout, unspecified: Secondary | ICD-10-CM

## 2013-05-26 DIAGNOSIS — Z23 Encounter for immunization: Secondary | ICD-10-CM

## 2013-05-26 DIAGNOSIS — E785 Hyperlipidemia, unspecified: Secondary | ICD-10-CM

## 2013-05-26 DIAGNOSIS — E039 Hypothyroidism, unspecified: Secondary | ICD-10-CM

## 2013-05-26 DIAGNOSIS — Z125 Encounter for screening for malignant neoplasm of prostate: Secondary | ICD-10-CM

## 2013-05-26 MED ORDER — LOSARTAN POTASSIUM 100 MG PO TABS: 100.0000 mg | ORAL_TABLET | Freq: Every day | ORAL | Status: DC

## 2013-05-26 MED ORDER — ALLOPURINOL 100 MG PO TABS: 100.0000 mg | ORAL_TABLET | Freq: Every day | ORAL | Status: DC

## 2013-05-26 MED ORDER — ALLOPURINOL 300 MG PO TABS: 300.0000 mg | ORAL_TABLET | Freq: Every day | ORAL | Status: DC

## 2013-05-26 MED ORDER — LEVOTHYROXINE SODIUM 137 MCG PO TABS: 137.0000 ug | ORAL_TABLET | Freq: Every day | ORAL | Status: DC

## 2013-05-26 MED ORDER — GEMFIBROZIL 600 MG PO TABS: 300.0000 mg | ORAL_TABLET | Freq: Two times a day (BID) | ORAL | Status: DC

## 2013-05-26 MED ORDER — PRAVASTATIN SODIUM 40 MG PO TABS: 40.0000 mg | ORAL_TABLET | Freq: Every day | ORAL | Status: DC

## 2013-05-26 MED ORDER — OMEPRAZOLE 20 MG PO CPDR: 20.0000 mg | DELAYED_RELEASE_CAPSULE | Freq: Every morning | ORAL | Status: DC

## 2013-05-26 NOTE — Assessment & Plan Note (Signed)
BP stable on current meds. Continue same.  

## 2013-05-26 NOTE — Assessment & Plan Note (Signed)
On simvastatin and lopid. Due to potential drug interaction, advised pt to switch to pravastatin 40.  Tricor was too expensive for him.  Repeat flp/lft next visit.

## 2013-05-26 NOTE — Assessment & Plan Note (Signed)
>>  ASSESSMENT AND PLAN FOR BENIGN ESSENTIAL HYPERTENSION WRITTEN ON 05/26/2013 11:13 AM BY O'SULLIVAN, Charles Niese, NP  BP stable on current meds.  Continue same.

## 2013-05-26 NOTE — Assessment & Plan Note (Signed)
Stable on allopurinol.  Continue same.  

## 2013-05-26 NOTE — Assessment & Plan Note (Signed)
Clinically stable. Obtain TSH, continue synthroid.  

## 2013-05-26 NOTE — Progress Notes (Signed)
Subjective:    Patient ID: [name] Pratt, male    DOB: August 01, 1941, 72 y.o.   MRN: [id number]  HPI  [name] Pratt is a 72 yr old male who presents today for follow up of multiple medical problems.  1) HTN-  He is currently maintained on losartan, amlodipine. He reports mild "gas pains."  Reports sob which resolves with use of nebulizer.    2) Gout-  Currently maintained on allopurinol. Denies recent flare ups.   3) Hyperlipidemia-  Last LDL 96 back in March.  He is maintained on simvastatin, lopid.  Denies associated myalgia.    Reports that he had an accidental fall- slipped on an extension cord.    He had a slip and fell on the left shoulder- had an xray- reportedly negative.   Review of Systems    see HPI  Past Medical History  Diagnosis Date  . Hyperlipidemia   . Hypertension   . Chest pain      Myoview in 2008 was normal.  Echo 7/09: EF 60%, normal wall motion, mild LVH, mild LAE.  [name] Nephrolithiasis   . Hiatal hernia   . Ileus   . Inguinal hernia   . Pancreatitis   . Rosacea   . Gout   . Arthritis   . History of chicken pox   . GERD (gastroesophageal reflux disease)   . Thyroid disease     hypothyroidism  . History of hemorrhoids   . OSA (obstructive sleep apnea)   . Carotid stenosis     a. Carotid U/S 5/13: LICA < 50%, RICA 50-69%;  b.  Carotid U/S 5/14:  RICA 40-59%; LICA 0-39%; f/u 1 year    History   Social History  . Marital Status: Married    Spouse Name: N/A    Number of Children: 1  . 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 Smoking status: Former Smoker -- 11 years    Types: Cigarettes    Quit date: 09/11/1978  . Smokeless tobacco: Never Used  . Alcohol Use: 0.6 oz/week    1 Glasses of wine per week     Comment: 1 per month  . Drug Use: Not on file  . Sexual Activity: Not on file   Other Topics Concern  . Not on file   Social History Narrative   Lives  with his wife   He has one son- lives locally.   2 grandchildren   Has worked for fire department   Part time pain business   Enjoys wood working   Completed HS.  [employer]    Past Surgical History  Procedure Laterality Date  . Laparoscopic cholecystectomy  09/20/06    with intraoperative cholangiogram and right inguinal herniorrhaphy with mesh   . Hiatal hernia repair  12/21/93  . Knee surgery  1995  . Hemorrhoid surgery    . Esophageal dilation    . Lithotripsy    . Inguinal hernia repair Right 07/15/12    Dr Ashley Jacobs  . Abdominal hernia repair  07/15/12    Dr Ashley Jacobs  . Nasal septum surgery  1975  . Esophageal dilation  1993 and 1994  . Brow lift  05/07/01  . Eye surgery Left 03/25/10    cataract removal  . Urethral dilation  1991    Dr. Annabell Howells  . Epidural injections      multiple procedures  .  Joint replacement  07/25/04    right knee  . Joint replacement  07/13/10    left    Family History  Problem Relation Age of Onset  . Hypertension    . Cancer    . Osteoarthritis      Allergies  Allergen Reactions  . Metoclopramide Hcl     anxiety    Current Outpatient Prescriptions on File Prior to Visit  Medication Sig Dispense Refill  . albuterol (PROVENTIL HFA;VENTOLIN HFA) 108 (90 BASE) MCG/ACT inhaler Inhale 2 puffs into the lungs every 6 (six) hours as needed for shortness of breath.      . allopurinol (ZYLOPRIM) 100 MG tablet Take 1 tablet (100 mg total) by mouth daily.  30 tablet  5  . allopurinol (ZYLOPRIM) 300 MG tablet Take 1 tablet (300 mg total) by mouth daily.  30 tablet  5  . amLODipine (NORVASC) 10 MG tablet Take 1 tablet (10 mg total) by mouth daily.  90 tablet  0  . aspirin 81 MG tablet Take 81 mg by mouth daily.      . cephALEXin (KEFLEX) 500 MG capsule Take 2,000 mg by mouth daily as needed (priir to dental procedures).      . cycloSPORINE (RESTASIS) 0.05 % ophthalmic emulsion Place 1 drop into both eyes 2 (two) times daily.      . diclofenac sodium  (VOLTAREN) 1 % GEL Apply 1 application topically daily as needed (arthritis).      [name] gemfibrozil (LOPID) 600 MG tablet Take 0.5 tablets (300 mg total) by mouth 2 (two) times daily before a meal.  60 tablet  2  . HYDROcodone-acetaminophen (NORCO/VICODIN) 5-325 MG per tablet Take 1 tablet by mouth every 6 (six) hours as needed for pain.  30 tablet  0  . levothyroxine (SYNTHROID, LEVOTHROID) 137 MCG tablet Take 1 tablet (137 mcg total) by mouth daily.  90 tablet  0  . losartan (COZAAR) 100 MG tablet Take 1 tablet (100 mg total) by mouth daily.  30 tablet  5  . methyldopa (ALDOMET) 250 MG tablet Take 1 tablet (250 mg total) by mouth 2 (two) times daily.  60 tablet  2  . metroNIDAZOLE (METROCREAM) 0.75 % cream Apply 1 application topically daily as needed (rosacia).      . multivitamin (THERAGRAN) per tablet Take 1 tablet by mouth daily.        [name] omeprazole (PRILOSEC) 20 MG capsule Take 1 capsule (20 mg total) by mouth every morning.  30 capsule  5  . OVER THE COUNTER MEDICATION Take 1 tablet by mouth daily as needed. Allergy and decongestant pill      . OVER THE COUNTER MEDICATION Place 1 application into both eyes daily as needed (dry eyes). No Tears Eye Ointment      . simvastatin (ZOCOR) 20 MG tablet Take 1 tablet (20 mg total) by mouth at bedtime.  30 tablet  2  . sodium chloride (OCEAN) 0.65 % nasal spray Place 1 spray into both nostrils at bedtime as needed for congestion.       No current facility-administered medications on file prior to visit.    BP 140/82  Pulse 54  Temp(Src) 97.9 F (36.6 C) (Oral)  Resp 16  Ht 5\' 7"  (1.702 m)  Wt 173 lb 1.3 oz (78.509 kg)  BMI 27.1 kg/m2  SpO2 97%    Objective:   Physical Exam  Constitutional: He is oriented to person, place, and time. He appears well-developed and well-nourished. No  distress.  HENT:  Head: Normocephalic and atraumatic.  Cardiovascular: Normal rate and regular rhythm.   No murmur heard. Pulmonary/Chest: Effort normal and  breath sounds normal. No respiratory distress. He has no wheezes. He has no rales. He exhibits no tenderness.  Neurological: He is alert and oriented to person, place, and time.  Psychiatric: He has a normal mood and affect. His behavior is normal. Judgment and thought content normal.          Assessment & Plan:

## 2013-05-26 NOTE — Patient Instructions (Addendum)
Stop simvastatin, start pravastatin. Please complete your lab work prior to leaving. Follow up in 3 months for a medicare wellness visit. Come fasting to this appointment.

## 2013-05-27 ENCOUNTER — Telehealth: Payer: Self-pay | Admitting: *Deleted

## 2013-05-27 ENCOUNTER — Encounter: Payer: Self-pay | Admitting: Family

## 2013-05-27 LAB — BASIC METABOLIC PANEL
Potassium: 4.4 mEq/L (ref 3.5–5.3)
Sodium: 140 mEq/L (ref 135–145)

## 2013-05-27 LAB — PSA, TOTAL AND FREE
PSA, Free Pct: 52 % (ref >25)
PSA, Free: 0.16 ng/mL

## 2013-05-27 NOTE — Telephone Encounter (Signed)
Received message from [place] at [facility] requesting clarification of Rxs received on 05/26/13 for allopurinol 100mg  and 300mg . Is pt supposed to be on both strengths? If not, which one is he supposed to be taking?

## 2013-05-27 NOTE — Telephone Encounter (Signed)
Yes please both strengths.

## 2013-05-27 NOTE — Telephone Encounter (Signed)
Notified Ashley at [facility].

## 2013-05-27 NOTE — Progress Notes (Signed)
  Subjective:    Patient ID: [name] Pratt, male    DOB: 17-Mar-1941, 72 y.o.   MRN: [id number]  HPI    Review of Systems     Objective:   Physical Exam        Assessment & Plan:  All prescriptions were called in to [facility] as they did not transmit electronically.

## 2013-07-02 ENCOUNTER — Telehealth: Payer: Self-pay | Admitting: *Deleted

## 2013-07-02 DIAGNOSIS — I10 Essential (primary) hypertension: Secondary | ICD-10-CM

## 2013-07-02 MED ORDER — METOPROLOL SUCCINATE ER 25 MG PO TB24: 25.0000 mg | ORAL_TABLET | Freq: Every day | ORAL | Status: DC

## 2013-07-02 NOTE — Telephone Encounter (Signed)
Stop methyldopa, start toprol xl, follow up in 2 weeks so we can recheck BP.

## 2013-07-02 NOTE — Telephone Encounter (Signed)
Left message for pt to return my call.

## 2013-07-02 NOTE — Telephone Encounter (Signed)
Received message from pt that his insurance no longer covers methyldopa. States he has been on this for years and wants to know if there is an alternative?  Please advise.

## 2013-07-03 NOTE — Telephone Encounter (Signed)
Patient informed, understood & agreed; BP follow-up scheduled for 11.07.14 at 1:00p/SLS

## 2013-07-07 ENCOUNTER — Other Ambulatory Visit: Payer: Self-pay | Admitting: Family

## 2013-07-12 HISTORY — PX: LIGAMENT REPAIR: SHX5444

## 2013-07-15 ENCOUNTER — Encounter: Payer: Self-pay | Admitting: Neurology

## 2013-07-15 ENCOUNTER — Ambulatory Visit (INDEPENDENT_AMBULATORY_CARE_PROVIDER_SITE_OTHER): Payer: Medicare Other | Admitting: Neurology

## 2013-07-15 VITALS — BP 160/79 | HR 55 | Resp 16 | Ht 67.0 in | Wt 176.0 lb

## 2013-07-15 DIAGNOSIS — H168 Other keratitis: Secondary | ICD-10-CM

## 2013-07-15 DIAGNOSIS — G4730 Sleep apnea, unspecified: Secondary | ICD-10-CM

## 2013-07-15 DIAGNOSIS — G4731 Primary central sleep apnea: Secondary | ICD-10-CM

## 2013-07-15 DIAGNOSIS — G4733 Obstructive sleep apnea (adult) (pediatric): Secondary | ICD-10-CM

## 2013-07-15 HISTORY — DX: Sleep apnea, unspecified: G47.30

## 2013-07-15 NOTE — Progress Notes (Signed)
[facility]  Provider:  Melvyn Novas, M D  Referring Provider: [name], NP Primary Care Physician:  Lemont Fillers., NP  Chief Complaint  Patient presents with  . OSA/ANNUAL    CPAP DOWNLOADED IN CLINIC    HPI:  [name] Pratt is a 72 y.o. male  Is seen here as a  revisit  from Dr. Peggyann Juba for for follow up on CPAP compliance.   This patient had been followed as was the year 06-Feb-2006, 8 and 9. He was placed on VPAP originally, but developed in Feb 07, 2008 more sleepiness and interrupted sleep.   A repeat sleep studies still documented  mostly central apnea, but  he was re- titrated and responded well to CPAP at 7 cm of water.  His last yearly CPAP revisit was on 07-18-12.  reported he had no longer insomnia-  his Epworth sleepiness score at the time was 7 points his fatigue score 18 points but he presented with red blotchy spots at the bridge of his nose and under both eyes. He needed caffeine in the day but only restricted himself to in the morning. He reportedly slept about 8 hours but not en bloc. And download in 02-07-12 children average daytime and therapy of 5 hours and 17 minutes, residual AHI of 7.3 that pressure at 7 cm water with 2 cm EPR. The patient used the machine 73/78 days. He will week spontaneously without the help of an alarm. He will drink caffeinated beverages but only in the morning. He will have one bathroom break each night The patient underwent a hip replacement in April 2014, has recovered well.  He still has the blotchy reddening of his eyes but reported he has dry eyes and uses a lot of her spaces.  Today's fatigue severity score is 22 points and his Epworth sleepiness score is 12 points the geriatric depression score is endorsed at 2 points. His sleep habits remaining as an established before. His bedtime is around 11 PM he falls asleep within 5 minutes or less. He sleeps for about 4 hours then he wakes up every hour after that, he  attributes this arousals to pain shoulder or back pain joint pain. He has osteoarthritis. He finally rises in the morning around 8 AM. Have one bathroom break at night. Laughingly reports that his wife's herself smokes love to hear him. He is not aware of still having any breakthrough snoring or apneas.  His brother died in Feb 06, 2013 as complications of alcoholism, cancer of the liver and  bones.   Review of Systems: Out of a complete 14 system review, the patient complains of only the following symptoms, and all other reviewed systems are negative. Fatigue, joint and back pain, fingers stiff, knee replacement,  left him with soreness, but much improved  Pain.Hp and knee both replaced bilaterally. Has still shoulder surgery planned.   History   Social History  . Marital Status: Married    Spouse Name: N/A    Number of Children: 1  . 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 Smoking status: Former Smoker -- 11 years    Types: Cigarettes    Quit date: 09/11/1978  . Smokeless tobacco: Never Used  . Alcohol Use: 0.6 oz/week    1 Glasses of wine per week     Comment: 1 per month  . Drug Use: Not on file  .  Sexual Activity: Not on file   Other Topics Concern  . Not on file   Social History Narrative   Lives with his wife   He has one son- lives locally.   2 grandchildren   Has worked for fire department   Part time pain business   Enjoys wood working   Completed HS.  [employer]    Family History  Problem Relation Age of Onset  . Hypertension    . Cancer    . Osteoarthritis      Past Medical History  Diagnosis Date  . Hyperlipidemia   . Hypertension   . Chest pain      Myoview in 2008 was normal.  Echo 7/09: EF 60%, normal wall motion, mild LVH, mild LAE.  [name] Nephrolithiasis   . Hiatal hernia   . Ileus   . Inguinal hernia   . Pancreatitis   . Rosacea   . Gout   . Arthritis   . History  of chicken pox   . GERD (gastroesophageal reflux disease)   . Thyroid disease     hypothyroidism  . History of hemorrhoids   . OSA (obstructive sleep apnea)   . Carotid stenosis     a. Carotid U/S 5/13: LICA < 50%, RICA 50-69%;  b.  Carotid U/S 5/14:  RICA 40-59%; LICA 0-39%; f/u 1 year    Past Surgical History  Procedure Laterality Date  . Laparoscopic cholecystectomy  09/20/06    with intraoperative cholangiogram and right inguinal herniorrhaphy with mesh   . Hiatal hernia repair  12/21/93  . Knee surgery  1995  . Hemorrhoid surgery    . Esophageal dilation    . Lithotripsy    . Inguinal hernia repair Right 07/15/12    Dr Ashley Jacobs  . Abdominal hernia repair  07/15/12    Dr Ashley Jacobs  . Nasal septum surgery  1975  . Esophageal dilation  1993 and 1994  . Brow lift  05/07/01  . Eye surgery Left 03/25/10    cataract removal  . Urethral dilation  1991    Dr. Annabell Howells  . Epidural injections      multiple procedures  . Joint replacement  07/25/04    right knee  . Joint replacement  07/13/10    left    Current Outpatient Prescriptions  Medication Sig Dispense Refill  . allopurinol (ZYLOPRIM) 100 MG tablet Take 1 tablet (100 mg total) by mouth daily.  90 tablet  1  . allopurinol (ZYLOPRIM) 300 MG tablet Take 1 tablet (300 mg total) by mouth daily.  90 tablet  1  . amLODipine (NORVASC) 10 MG tablet TAKE ONE TABLET BY MOUTH ONCE DAILY  90 tablet  1  . aspirin 81 MG tablet Take 81 mg by mouth daily.      . cephALEXin (KEFLEX) 500 MG capsule Take 2,000 mg by mouth daily as needed (priir to dental procedures).      . cycloSPORINE (RESTASIS) 0.05 % ophthalmic emulsion Place 1 drop into both eyes 2 (two) times daily.      . diclofenac sodium (VOLTAREN) 1 % GEL Apply 1 application topically daily as needed (arthritis).      . FIBER FORMULA CAPS Take 1 capsule by mouth 2 (two) times daily.      [name] gemfibrozil (LOPID) 600 MG tablet Take 0.5 tablets (300 mg total) by mouth 2 (two) times daily before a  meal.  180 tablet  1  . levothyroxine (SYNTHROID, LEVOTHROID) 137 MCG tablet Take  1 tablet (137 mcg total) by mouth daily.  90 tablet  1  . losartan (COZAAR) 100 MG tablet Take 1 tablet (100 mg total) by mouth daily.  90 tablet  1  . metoprolol succinate (TOPROL-XL) 25 MG 24 hr tablet Take 1 tablet (25 mg total) by mouth daily.  30 tablet  0  . metroNIDAZOLE (METROCREAM) 0.75 % cream Apply 1 application topically daily as needed (rosacia).      . multivitamin (THERAGRAN) per tablet Take 1 tablet by mouth daily.        [name] omeprazole (PRILOSEC) 20 MG capsule Take 1 capsule (20 mg total) by mouth every morning.  90 capsule  1  . OVER THE COUNTER MEDICATION Take 1 tablet by mouth daily as needed. Allergy and decongestant pill      . OVER THE COUNTER MEDICATION Place 1 application into both eyes daily as needed (dry eyes). No Tears Eye Ointment      . pravastatin (PRAVACHOL) 40 MG tablet Take 1 tablet (40 mg total) by mouth daily.  90 tablet  1  . sodium chloride (OCEAN) 0.65 % nasal spray Place 1 spray into both nostrils at bedtime as needed for congestion.      [name] albuterol (PROVENTIL HFA;VENTOLIN HFA) 108 (90 BASE) MCG/ACT inhaler Inhale 2 puffs into the lungs every 6 (six) hours as needed for shortness of breath.      [name] HYDROcodone-acetaminophen (NORCO/VICODIN) 5-325 MG per tablet Take 1 tablet by mouth every 6 (six) hours as needed for pain.  30 tablet  0   No current facility-administered medications for this visit.    Allergies as of 07/15/2013 - Review Complete 07/15/2013  Allergen Reaction Noted  . Metoclopramide hcl  06/12/2011    Vitals: BP 160/79  Pulse 55  Resp 16  Ht 5\' 7"  (1.702 m)  Wt 176 lb (79.833 kg)  BMI 27.56 kg/m2 Last Weight:  Wt Readings from Last 1 Encounters:  07/15/13 176 lb (79.833 kg)   Last Height:   Ht Readings from Last 1 Encounters:  07/15/13 5\' 7"  (1.702 m)    Physical exam:  General: The patient is awake, alert and appears not in acute distress. The  patient is well groomed. Head: Normocephalic, atraumatic. Neck is supple. Mallampati 2 , nasal speech , dry eyes,  , neck circumference: 13.5 . Cardiovascular:  Regular rate and rhythm , without  murmurs or carotid bruit, and without distended neck veins. Respiratory: Lungs are clear to auscultation. Skin:  Without evidence of edema, or rash Trunk: BMI is  elevated and patient,  has normal posture.  Neurologic exam : The patient is awake and alert, oriented to place and time.  Memory subjective  described as intact. There is a normal attention span & concentration ability.  Speech is fluent without  dysarthria, dysphonia or aphasia. Mood and affect are appropriate.  Cranial nerves: Pupils are equal and briskly reactive to light. Funduscopic exam without  evidence of pallor or edema. Extraocular movements  in vertical and horizontal planes intact and without nystagmus. Visual fields by finger perimetry are intact. Hearing to finger rub intact.  Facial sensation intact to fine touch.  Facial motor strength is symmetric and tongue and uvula move midline.  Motor exam:   Normal tone and normal muscle bulk and symmetric normal strength in all extremities.  Sensory:  Fine touch, pinprick and vibration were tested in all extremities. Proprioception is normal.  Toes feel numb, but he can sense touch and vibration.  Coordination: Rapid alternating movements in the fingers/hands isnormal.  Finger-to-nose maneuver  without evidence of ataxia, dysmetria or tremor.  Gait and station: Patient walks without assistive device . Strength within normal limits. Stance is stable and normal. Tandem gait is deferred, Steps are unfragmented.  Romberg testing is normal.  Deep tendon reflexes: in the  upper and lower extremities are symmetric and intact. Babinski maneuver response is downgoing.   Assessment:  After physical and neurologic examination, review of sleep and  laboratory studies,and pre-existing  records, assessment is that of a patient with complex sleep apnea  that responded sufficient to CPAP at 7 cm water, compliance is high. Download discussed.   Plan:  Treatment plan and additional workup : continue CPAP at 7 cm water , with FFM of patient's choice.   The patient would like to try a nasal mask.  He last tried one in 2009, when was told he was a mouth breather .  Humidifier .  Dry eyes and mouth - sicca ?  Test for Sjogrens, patient denies COPD diagnosis. Has Osteoarthritis.   Shingles and flue vaccine per PCP, no falls in 12 month.  RV 12 month

## 2013-07-15 NOTE — Patient Instructions (Signed)
Sleep Apnea   Sleep apnea is a sleep disorder characterized by abnormal pauses in breathing while you sleep. When your breathing pauses, the level of oxygen in your blood decreases. This causes you to move out of deep sleep and into light sleep. As a result, your quality of sleep is poor, and the system that carries your blood throughout your body (cardiovascular system) experiences stress. If sleep apnea remains untreated, the following conditions can develop:  · High blood pressure (hypertension).  · Coronary artery disease.  · Inability to achieve or maintain an erection (impotence).  · Impairment of your thought process (cognitive dysfunction).  There are three types of sleep apnea:  1. Obstructive sleep apnea Pauses in breathing during sleep because of a blocked airway.  2. Central sleep apnea Pauses in breathing during sleep because the area of the brain that controls your breathing does not send the correct signals to the muscles that control breathing.  3. Mixed sleep apnea A combination of both obstructive and central sleep apnea.  RISK FACTORS  The following risk factors can increase your risk of developing sleep apnea:  · Being overweight.  · Smoking.  · Having narrow passages in your nose and throat.  · Being of older age.  · Being male.  · Alcohol use.  · Sedative and tranquilizer use.  · Ethnicity. Among individuals younger than 35 years, African Americans are at increased risk of sleep apnea.  SYMPTOMS   · Difficulty staying asleep.  · Daytime sleepiness and fatigue.  · Loss of energy.  · Irritability.  · Loud, heavy snoring.  · Morning headaches.  · Trouble concentrating.  · Forgetfulness.  · Decreased interest in sex.  DIAGNOSIS   In order to diagnose sleep apnea, your caregiver will perform a physical examination. Your caregiver may suggest that you take a home sleep test. Your caregiver may also recommend that you spend the night in a sleep lab. In the sleep lab, several monitors record  information about your heart, lungs, and brain while you sleep. Your leg and arm movements and blood oxygen level are also recorded.  TREATMENT  The following actions may help to resolve mild sleep apnea:  · Sleeping on your side.    · Using a decongestant if you have nasal congestion.    · Avoiding the use of depressants, including alcohol, sedatives, and narcotics.    · Losing weight and modifying your diet if you are overweight.  There also are devices and treatments to help open your airway:  · Oral appliances. These are custom-made mouthpieces that shift your lower jaw forward and slightly open your bite. This opens your airway.  · Devices that create positive airway pressure. This positive pressure "splints" your airway open to help you breathe better during sleep. The following devices create positive airway pressure:  · Continuous positive airway pressure (CPAP) device. The CPAP device creates a continuous level of air pressure with an air pump. The air is delivered to your airway through a mask while you sleep. This continuous pressure keeps your airway open.  · Nasal expiratory positive airway pressure (EPAP) device. The EPAP device creates positive air pressure as you exhale. The device consists of single-use valves, which are inserted into each nostril and held in place by adhesive. The valves create very little resistance when you inhale but create much more resistance when you exhale. That increased resistance creates the positive airway pressure. This positive pressure while you exhale keeps your airway open, making it easier   to breath when you inhale again.  · Bilevel positive airway pressure (BPAP) device. The BPAP device is used mainly in patients with central sleep apnea. This device is similar to the CPAP device because it also uses an air pump to deliver continuous air pressure through a mask. However, with the BPAP machine, the pressure is set at two different levels. The pressure when you  exhale is lower than the pressure when you inhale.  · Surgery. Typically, surgery is only done if you cannot comply with less invasive treatments or if the less invasive treatments do not improve your condition. Surgery involves removing excess tissue in your airway to create a wider passage way.  Document Released: 08/18/2002 Document Revised: 02/27/2012 Document Reviewed: 01/04/2012  [employer]® Patient Information ©2014 [employer].

## 2013-07-16 ENCOUNTER — Encounter: Payer: Self-pay | Admitting: Neurology

## 2013-07-16 LAB — SJOGRENS SYNDROME-A EXTRACTABLE NUCLEAR ANTIBODY: ENA SSA (RO) Ab: <0.2 AI (ref 0.0–0.9)

## 2013-07-16 LAB — SJOGRENS SYNDROME-B EXTRACTABLE NUCLEAR ANTIBODY: ENA SSB (LA) Ab: <0.2 AI (ref 0.0–0.9)

## 2013-07-18 ENCOUNTER — Ambulatory Visit (INDEPENDENT_AMBULATORY_CARE_PROVIDER_SITE_OTHER): Payer: Medicare Other | Admitting: Family

## 2013-07-18 ENCOUNTER — Encounter: Payer: Self-pay | Admitting: Family

## 2013-07-18 VITALS — BP 126/74 | HR 67 | Temp 97.8°F | Resp 16 | Ht 67.0 in | Wt 178.1 lb

## 2013-07-18 DIAGNOSIS — I10 Essential (primary) hypertension: Secondary | ICD-10-CM

## 2013-07-18 MED ORDER — METOPROLOL SUCCINATE ER 25 MG PO TB24: 25.0000 mg | ORAL_TABLET | Freq: Every day | ORAL | Status: DC

## 2013-07-18 NOTE — Patient Instructions (Signed)
Continue metoprolol. Keep upcoming appointment for wellness visit.

## 2013-07-18 NOTE — Progress Notes (Signed)
Subjective:    Patient ID: [name] Pratt, male    DOB: 01-07-1941, 72 y.o.   MRN: [id number]  HPI  Mr. Speir is a 72 yr old male who presents today for follow up of his hypertension.  His methyldopa was recently discontinued due to insurance non-coverage and he was placed on metoprolol 25mg  once daily.  Reports that he is tolerating without difficulty.  Reports that he had MRI of his left shoulder following a fall 2 months ago and saw Dr. Berneta Sages. Will have arthroscopic repair.    He continues to have discomfort in the right groin, particularly when he is bending over.   He is s/p R THA and repair of RIH.  He was told by ortho that his pain might be related to a nerve.  He plans to arrange follow up with Dr. Ashley Jacobs his surgeon.   Reports that he saw Dr. Vickey Huger yesterday for follow up of his OSA and that she has ordered lab work for evaluation of Sjogren's syndrome.   Review of Systems See HPI  Past Medical History  Diagnosis Date  . Hyperlipidemia   . Hypertension   . Chest pain      Myoview in 2008 was normal.  Echo 7/09: EF 60%, normal wall motion, mild LVH, mild LAE.  [name] Nephrolithiasis   . Hiatal hernia   . Ileus   . Inguinal hernia   . Pancreatitis   . Rosacea   . Gout   . Arthritis   . History of chicken pox   . GERD (gastroesophageal reflux disease)   . Thyroid disease     hypothyroidism  . History of hemorrhoids   . OSA (obstructive sleep apnea)   . Carotid stenosis     a. Carotid U/S 5/13: LICA < 50%, RICA 50-69%;  b.  Carotid U/S 5/14:  RICA 40-59%; LICA 0-39%; f/u 1 year    History   Social History  . Marital Status: Married    Spouse Name: N/A    Number of Children: 1  . 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 Smoking status: Former Smoker -- 11 years    Types: Cigarettes    Quit date: 09/11/1978  . Smokeless tobacco: Never Used  . Alcohol Use: 0.6 oz/week     1 Glasses of wine per week     Comment: 1 per month  . Drug Use: Not on file  . Sexual Activity: Not on file   Other Topics Concern  . Not on file   Social History Narrative   Lives with his wife   He has one son- lives locally.   2 grandchildren   Has worked for fire department   Part time pain business   Enjoys wood working   Completed HS.  [employer]    Past Surgical History  Procedure Laterality Date  . Laparoscopic cholecystectomy  09/20/06    with intraoperative cholangiogram and right inguinal herniorrhaphy with mesh   . Hiatal hernia repair  12/21/93  . Knee surgery  1995  . Hemorrhoid surgery    . Esophageal dilation    . Lithotripsy    . Inguinal hernia repair Right 07/15/12    Dr Ashley Jacobs  . Abdominal hernia repair  07/15/12    Dr Ashley Jacobs  . Nasal septum surgery  1975  . Esophageal dilation  1993 and 1994  .  Brow lift  05/07/01  . Eye surgery Left 03/25/10    cataract removal  . Urethral dilation  1991    Dr. Annabell Howells  . Epidural injections      multiple procedures  . Joint replacement  07/25/04    right knee  . Joint replacement  07/13/10    left    Family History  Problem Relation Age of Onset  . Hypertension    . Cancer    . Osteoarthritis      Allergies  Allergen Reactions  . Metoclopramide Hcl     anxiety    Current Outpatient Prescriptions on File Prior to Visit  Medication Sig Dispense Refill  . albuterol (PROVENTIL HFA;VENTOLIN HFA) 108 (90 BASE) MCG/ACT inhaler Inhale 2 puffs into the lungs every 6 (six) hours as needed for shortness of breath.      . allopurinol (ZYLOPRIM) 100 MG tablet Take 1 tablet (100 mg total) by mouth daily.  90 tablet  1  . allopurinol (ZYLOPRIM) 300 MG tablet Take 1 tablet (300 mg total) by mouth daily.  90 tablet  1  . amLODipine (NORVASC) 10 MG tablet TAKE ONE TABLET BY MOUTH ONCE DAILY  90 tablet  1  . aspirin 81 MG tablet Take 81 mg by mouth daily.      . cephALEXin (KEFLEX) 500 MG capsule Take 2,000 mg by mouth  daily as needed (priir to dental procedures).      . cycloSPORINE (RESTASIS) 0.05 % ophthalmic emulsion Place 1 drop into both eyes 2 (two) times daily.      . diclofenac sodium (VOLTAREN) 1 % GEL Apply 1 application topically daily as needed (arthritis).      . FIBER FORMULA CAPS Take 1 capsule by mouth 2 (two) times daily.      [name] gemfibrozil (LOPID) 600 MG tablet Take 0.5 tablets (300 mg total) by mouth 2 (two) times daily before a meal.  180 tablet  1  . HYDROcodone-acetaminophen (NORCO/VICODIN) 5-325 MG per tablet Take 1 tablet by mouth every 6 (six) hours as needed for pain.  30 tablet  0  . levothyroxine (SYNTHROID, LEVOTHROID) 137 MCG tablet Take 1 tablet (137 mcg total) by mouth daily.  90 tablet  1  . losartan (COZAAR) 100 MG tablet Take 1 tablet (100 mg total) by mouth daily.  90 tablet  1  . metoprolol succinate (TOPROL-XL) 25 MG 24 hr tablet Take 1 tablet (25 mg total) by mouth daily.  30 tablet  0  . metroNIDAZOLE (METROCREAM) 0.75 % cream Apply 1 application topically daily as needed (rosacia).      . multivitamin (THERAGRAN) per tablet Take 1 tablet by mouth daily.        [name] omeprazole (PRILOSEC) 20 MG capsule Take 1 capsule (20 mg total) by mouth every morning.  90 capsule  1  . OVER THE COUNTER MEDICATION Take 1 tablet by mouth daily as needed. Allergy and decongestant pill      . OVER THE COUNTER MEDICATION Place 1 application into both eyes daily as needed (dry eyes). No Tears Eye Ointment      . pravastatin (PRAVACHOL) 40 MG tablet Take 1 tablet (40 mg total) by mouth daily.  90 tablet  1  . sodium chloride (OCEAN) 0.65 % nasal spray Place 1 spray into both nostrils at bedtime as needed for congestion.       No current facility-administered medications on file prior to visit.    BP 126/74  Pulse 67  Temp(Src) 97.8 F (36.6 C) (Oral)  Resp 16  Ht 5\' 7"  (1.702 m)  Wt 178 lb 1.3 oz (80.777 kg)  BMI 27.88 kg/m2  SpO2 97%       Objective:   Physical Exam   Constitutional: He is oriented to person, place, and time. He appears well-developed and well-nourished. No distress.  Cardiovascular: Normal rate and regular rhythm.   No murmur heard. Pulmonary/Chest: Effort normal and breath sounds normal. No respiratory distress. He has no wheezes. He has no rales. He exhibits no tenderness.  Abdominal: Soft. Bowel sounds are normal.  Musculoskeletal: He exhibits no edema.  Neurological: He is alert and oriented to person, place, and time.  Psychiatric: He has a normal mood and affect. His behavior is normal. Judgment and thought content normal.          Assessment & Plan:

## 2013-07-18 NOTE — Assessment & Plan Note (Signed)
>>  ASSESSMENT AND PLAN FOR BENIGN ESSENTIAL HYPERTENSION WRITTEN ON 07/18/2013  1:41 PM BY O'SULLIVAN, Srah Ake, NP  Stable on metoprolol, continue same.

## 2013-07-18 NOTE — Assessment & Plan Note (Signed)
Stable on metoprolol, continue same.  

## 2013-07-27 DIAGNOSIS — M75100 Unspecified rotator cuff tear or rupture of unspecified shoulder, not specified as traumatic: Secondary | ICD-10-CM

## 2013-07-27 HISTORY — DX: Unspecified rotator cuff tear or rupture of unspecified shoulder, not specified as traumatic: M75.100

## 2013-08-15 ENCOUNTER — Telehealth: Payer: Self-pay | Admitting: Family

## 2013-08-15 NOTE — Telephone Encounter (Signed)
refill-freestyle lite test strips. Test twice a day. Qty 100 last fill 3.7.13

## 2013-08-18 ENCOUNTER — Telehealth: Payer: Self-pay | Admitting: *Deleted

## 2013-08-18 NOTE — Telephone Encounter (Signed)
Note states to refill freestyle test strips....there aren't any already ordered.Marland KitchenMarland Kitchen

## 2013-08-18 NOTE — Telephone Encounter (Signed)
Does pt need these? 

## 2013-08-18 NOTE — Telephone Encounter (Signed)
I am not sure.  Would you mind checking with him tomorrow if he is checking his sugar and if so how often.  I see he had a mild elevation of A1C in the past, but not in diabetic range.

## 2013-08-19 NOTE — Telephone Encounter (Signed)
Spoke with pt, he states he checks his levels once a day sometimes twice if he is feeling bad.  Reports that he has been checking it daily since he was told he was borderline diabetic.

## 2013-08-20 MED ORDER — GLUCOSE BLOOD VI STRP: ORAL_STRIP | Status: DC

## 2013-08-25 ENCOUNTER — Encounter: Payer: Self-pay | Admitting: Family

## 2013-08-25 ENCOUNTER — Ambulatory Visit (INDEPENDENT_AMBULATORY_CARE_PROVIDER_SITE_OTHER): Payer: Medicare Other | Admitting: Family

## 2013-08-25 ENCOUNTER — Other Ambulatory Visit: Payer: Self-pay | Admitting: Family

## 2013-08-25 ENCOUNTER — Telehealth: Payer: Self-pay | Admitting: Family

## 2013-08-25 VITALS — BP 146/84 | HR 60 | Temp 97.8°F | Resp 16 | Ht 67.0 in | Wt 176.1 lb

## 2013-08-25 DIAGNOSIS — E785 Hyperlipidemia, unspecified: Secondary | ICD-10-CM

## 2013-08-25 DIAGNOSIS — I10 Essential (primary) hypertension: Secondary | ICD-10-CM

## 2013-08-25 DIAGNOSIS — Z0000 Encounter for general adult medical examination without abnormal findings: Secondary | ICD-10-CM

## 2013-08-25 DIAGNOSIS — E039 Hypothyroidism, unspecified: Secondary | ICD-10-CM

## 2013-08-25 DIAGNOSIS — Z13820 Encounter for screening for osteoporosis: Secondary | ICD-10-CM

## 2013-08-25 DIAGNOSIS — I6523 Occlusion and stenosis of bilateral carotid arteries: Secondary | ICD-10-CM

## 2013-08-25 DIAGNOSIS — G4733 Obstructive sleep apnea (adult) (pediatric): Secondary | ICD-10-CM

## 2013-08-25 DIAGNOSIS — R739 Hyperglycemia, unspecified: Secondary | ICD-10-CM

## 2013-08-25 DIAGNOSIS — Z23 Encounter for immunization: Secondary | ICD-10-CM

## 2013-08-25 DIAGNOSIS — M109 Gout, unspecified: Secondary | ICD-10-CM

## 2013-08-25 DIAGNOSIS — G4730 Sleep apnea, unspecified: Secondary | ICD-10-CM

## 2013-08-25 DIAGNOSIS — I6529 Occlusion and stenosis of unspecified carotid artery: Secondary | ICD-10-CM

## 2013-08-25 LAB — LIPID PANEL
HDL: 35 mg/dL — ABNORMAL LOW (ref >39)
LDL Cholesterol: 84 mg/dL (ref 0–99)
Triglycerides: 137 mg/dL (ref <150)
VLDL: 27 mg/dL (ref 0–40)

## 2013-08-25 LAB — HEPATIC FUNCTION PANEL
ALT: 16 U/L (ref 0–53)
Albumin: 4.6 g/dL (ref 3.5–5.2)
Indirect Bilirubin: 0.5 mg/dL (ref 0.0–0.9)
Total Bilirubin: 0.6 mg/dL (ref 0.3–1.2)
Total Protein: 7.8 g/dL (ref 6.0–8.3)

## 2013-08-25 LAB — HEMOGLOBIN A1C: Hgb A1c MFr Bld: 6 % — ABNORMAL HIGH (ref <5.7)

## 2013-08-25 MED ORDER — LEVOTHYROXINE SODIUM 150 MCG PO TABS: 150.0000 ug | ORAL_TABLET | Freq: Every day | ORAL | Status: DC

## 2013-08-25 MED ORDER — HYDRALAZINE HCL 25 MG PO TABS: 25.0000 mg | ORAL_TABLET | Freq: Three times a day (TID) | ORAL | Status: DC

## 2013-08-25 MED ORDER — GLUCOSE BLOOD VI STRP: ORAL_STRIP | Status: DC

## 2013-08-25 NOTE — Assessment & Plan Note (Signed)
Currently stable on allopurinol.

## 2013-08-25 NOTE — Progress Notes (Signed)
Pre visit review using our clinic review tool, if applicable. No additional management support is needed unless otherwise documented below in the visit note. 

## 2013-08-25 NOTE — Assessment & Plan Note (Signed)
Tolerating pravastatin/lopid. Obtain FLP/lft.

## 2013-08-25 NOTE — Assessment & Plan Note (Signed)
Clinically stable on synthroid, obtain TSH.  

## 2013-08-25 NOTE — Assessment & Plan Note (Signed)
Stable on CPAP, continue same. 

## 2013-08-25 NOTE — Patient Instructions (Signed)
Please complete lab work prior to leaving. Schedule bone density at the front desk. Please schedule a follow up appointment in 3 months.

## 2013-08-25 NOTE — Progress Notes (Signed)
Subjective:    Patient ID: [name] Pratt, male    DOB: [birth year], 72 y.o.   MRN: [id number]  HPI  Subjective:   Patient here for Medicare annual wellness visit and management of other chronic and acute problems.  Carotid stenosis- Due for follow up in May.   Gout- Reports one evening of gout after he forgot allopurinol- then resolved.   OSA- Continue CPAP, tolerating without difficulty.  Hyperlipidemia- Continues pravastatin and lopid.  Last LDL at goal in March.  Mild HS myalgia only which resolved with moving around.    HTN- Reports that at home his BP generally 140/70's occasionally lower.  He is maintained on losartan 100, amlodipine 10, toprol xl 25mg .  Intolerant to diuretics, due to gout flare.    Hypothyroid- stable on currrent dose of synthroid.    Preventative-  Immunizations: due for prevnar.  Colonoscopy: 2014- Dr. Kinnie Scales.   Risk factors: at risk for CAD due to Hyperlipidemia, carotid disease  Roster of Physicians Providing Medical Care to Patient: Dr. Severiano Gilbert- ortho Dr. [name] - cardio Dr. Richardean Chimera- neuro/sleep specialist  Activities of Daily Living  In your present state of health, do you have any difficulty performing the following activities? Preparing food and eating?: No  Bathing yourself: No  Getting dressed: No  Using the toilet:No  Moving around from place to place: No  In the past year have you fallen or had a near fall?: yes- fell after hip surgery Home Safety: Has smoke detector and wears seat belts. No firearms. No excess sun exposure.  Diet and Exercise  Current exercise habits: does some walking/elliptical.  Dietary issues discussed: healthy diet   Depression Screen  (Note: if answer to either of the following is "Yes", then a more complete depression screening is indicated)  Q1: Over the past two weeks, have you felt down, depressed or hopeless?no  Q2: Over the past two weeks, have you felt little interest or pleasure in doing  things? no   The following portions of the patient's history were reviewed and updated as appropriate: allergies, current medications, past family history, past medical history, past social history, past surgical history and problem list.    Objective:   Vision: see nursing- pt to make apt with eye doctor Hearing: able to hear forced whisper at 6 feet. Body mass index: Cognitive Impairment Assessment: cognition, memory and judgment appear normal.   Assessment:   Medicare wellness utd on preventive parameters   Plan:    During the course of the visit the patient was educated and counseled about appropriate screening and preventive services including:       Fall prevention   Bone densitometry screening  Diabetes screening  Nutrition counseling   Vaccines / LABS  Prevnar  Patient Instructions (the written plan) was given to the patient.   Review of Systems    see HPI  Past Medical History  Diagnosis Date  . Hyperlipidemia   . Hypertension   . Chest pain      Myoview in 2008 was normal.  Echo 7/09: EF 60%, normal wall motion, mild LVH, mild LAE.  [name] Nephrolithiasis   . Hiatal hernia   . Ileus   . Inguinal hernia   . Pancreatitis   . Rosacea   . Gout   . Arthritis   . History of chicken pox   . GERD (gastroesophageal reflux disease)   . Thyroid disease     hypothyroidism  . History of hemorrhoids   . OSA (  obstructive sleep apnea)   . Carotid stenosis     a. Carotid U/S 5/13: LICA < 50%, RICA 50-69%;  b.  Carotid U/S 5/14:  RICA 40-59%; LICA 0-39%; f/u 1 year    History   Social History  . Marital Status: Married    Spouse Name: N/A    Number of Children: 1  . 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 Smoking status: Former Smoker -- 11 years    Types: Cigarettes    Quit date: 09/11/1978  . Smokeless tobacco: Never Used  . Alcohol Use: 0.6 oz/week    1 Glasses of wine per week       Comment: 1 per month  . Drug Use: Not on file  . Sexual Activity: Not on file   Other Topics Concern  . Not on file   Social History Narrative   Lives with his wife   He has one son- lives locally.   2 grandchildren   Has worked for fire department   Part time pain business   Enjoys wood working   Completed HS.  [employer]    Past Surgical History  Procedure Laterality Date  . Laparoscopic cholecystectomy  09/20/06    with intraoperative cholangiogram and right inguinal herniorrhaphy with mesh   . Hiatal hernia repair  12/21/93  . Knee surgery  1995  . Hemorrhoid surgery    . Esophageal dilation    . Lithotripsy    . Inguinal hernia repair Right 07/15/12    Dr Ashley Jacobs  . Abdominal hernia repair  07/15/12    Dr Ashley Jacobs  . Nasal septum surgery  1975  . Esophageal dilation  1993 and 1994  . Brow lift  05/07/01  . Eye surgery Left 03/25/10    cataract removal  . Urethral dilation  1991    Dr. Annabell Howells  . Epidural injections      multiple procedures  . Joint replacement  07/25/04    right knee  . Joint replacement  07/13/10    left  . 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 Ligament repair Left 07/2013    shoulder    Family History  Problem Relation Age of Onset  . Hypertension    . Cancer    . Osteoarthritis      Allergies  Allergen Reactions  . Metoclopramide Hcl     anxiety    Current Outpatient Prescriptions on File Prior to Visit  Medication Sig Dispense Refill  . albuterol (PROVENTIL HFA;VENTOLIN HFA) 108 (90 BASE) MCG/ACT inhaler Inhale 2 puffs into the lungs every 6 (six) hours as needed for shortness of breath.      . allopurinol (ZYLOPRIM) 100 MG tablet Take 1 tablet (100 mg total) by mouth daily.  90 tablet  1  . allopurinol (ZYLOPRIM) 300 MG tablet Take 1 tablet (300 mg total) by mouth daily.  90 tablet  1  . amLODipine (NORVASC) 10 MG tablet TAKE ONE TABLET BY MOUTH ONCE DAILY  90 tablet  1  . aspirin 81 MG tablet Take 81 mg by mouth daily.      .  cycloSPORINE (RESTASIS) 0.05 % ophthalmic emulsion Place 1 drop into both eyes 2 (two) times daily.      . diclofenac sodium (VOLTAREN) 1 % GEL Apply 1 application topically daily as needed (arthritis).      . FIBER FORMULA  CAPS Take 1 capsule by mouth 2 (two) times daily.      [name] gemfibrozil (LOPID) 600 MG tablet Take 0.5 tablets (300 mg total) by mouth 2 (two) times daily before a meal.  180 tablet  1  . HYDROcodone-acetaminophen (NORCO/VICODIN) 5-325 MG per tablet Take 1 tablet by mouth every 6 (six) hours as needed for pain.  30 tablet  0  . levothyroxine (SYNTHROID, LEVOTHROID) 137 MCG tablet Take 1 tablet (137 mcg total) by mouth daily.  90 tablet  1  . losartan (COZAAR) 100 MG tablet Take 1 tablet (100 mg total) by mouth daily.  90 tablet  1  . metoprolol succinate (TOPROL-XL) 25 MG 24 hr tablet Take 1 tablet (25 mg total) by mouth daily.  30 tablet  5  . metroNIDAZOLE (METROCREAM) 0.75 % cream Apply 1 application topically daily as needed (rosacia).      . multivitamin (THERAGRAN) per tablet Take 1 tablet by mouth daily.        [name] omeprazole (PRILOSEC) 20 MG capsule Take 1 capsule (20 mg total) by mouth every morning.  90 capsule  1  . OVER THE COUNTER MEDICATION Take 1 tablet by mouth daily as needed. Allergy and decongestant pill      . OVER THE COUNTER MEDICATION Place 1 application into both eyes daily as needed (dry eyes). No Tears Eye Ointment      . pravastatin (PRAVACHOL) 40 MG tablet Take 1 tablet (40 mg total) by mouth daily.  90 tablet  1  . sodium chloride (OCEAN) 0.65 % nasal spray Place 1 spray into both nostrils at bedtime as needed for congestion.       No current facility-administered medications on file prior to visit.    BP 146/84  Pulse 60  Temp(Src) 97.8 F (36.6 C) (Oral)  Resp 16  Ht 5\' 7"  (1.702 m)  Wt 176 lb 1.3 oz (79.869 kg)  BMI 27.57 kg/m2  SpO2 96%    Objective:   Physical Exam  Physical Exam  Constitutional: He is oriented to person, place, and  time. He appears well-developed and well-nourished. No distress.  HENT:  Head: Normocephalic and atraumatic.  Right Ear: Tympanic membrane and ear canal normal.  Left Ear: Tympanic membrane and ear canal normal.  Mouth/Throat: Oropharynx is clear and moist.  Eyes: Pupils are equal, round, and reactive to light. No scleral icterus.  Neck: Normal range of motion. No thyromegaly present.  Cardiovascular: Normal rate and regular rhythm.   No murmur heard. Pulmonary/Chest: Effort normal and breath sounds normal. No respiratory distress. He has no wheezes. He has no rales. He exhibits no tenderness.  Abdominal: Soft. Bowel sounds are normal. He exhibits no distension and no mass. There is no tenderness. There is no rebound and no guarding.  Musculoskeletal: He exhibits no edema.  Lymphadenopathy:    He has no cervical adenopathy.  Neurological: He is alert and oriented to person, place, and time. He has normal reflexes. He exhibits normal muscle tone. Coordination normal.  Skin: Skin is warm and dry. appears sun damaged.  Psychiatric: He has a normal mood and affect. His behavior is normal. Judgment and thought content normal.          Assessment & Plan:        Assessment & Plan:

## 2013-08-25 NOTE — Assessment & Plan Note (Signed)
Uncontrolled. Add hydralazine.  

## 2013-08-25 NOTE — Assessment & Plan Note (Signed)
Due for follow up carotid duplex in 5/14. Continue ASA.

## 2013-08-25 NOTE — Telephone Encounter (Signed)
Lab work shows that synthroid needs to be increased.  Increase to 150 mcg. Repeat TSH in 6 weeks, dx hypothyroid. Sugar is well controlled. Cholesterol looks good overall. HDL "good cholesterol" could be higher. Exercise can help HDL.

## 2013-08-25 NOTE — Assessment & Plan Note (Signed)
>>  ASSESSMENT AND PLAN FOR BENIGN ESSENTIAL HYPERTENSION WRITTEN ON 08/25/2013 10:01 AM BY O'SULLIVAN, Lexia Vandevender, NP  Uncontrolled. Add hydralazine.

## 2013-08-26 NOTE — Telephone Encounter (Signed)
Notified pt and he voices understanding. Lab order entered. Pt requests result be sent to him through mychart. Message sent.

## 2013-08-29 ENCOUNTER — Telehealth: Payer: Self-pay | Admitting: *Deleted

## 2013-08-29 ENCOUNTER — Ambulatory Visit (INDEPENDENT_AMBULATORY_CARE_PROVIDER_SITE_OTHER)
Admission: RE | Admit: 2013-08-29 | Discharge: 2013-08-29 | Disposition: A | Discharge status: Home / Self Care | Payer: Medicare Other | Source: Ambulatory Visit | Attending: Family | Admitting: Family

## 2013-08-29 DIAGNOSIS — Z13820 Encounter for screening for osteoporosis: Secondary | ICD-10-CM

## 2013-08-29 NOTE — Telephone Encounter (Signed)
Pt is scheduled for DEXA at [facility] at 10am today.  They need orders put in for DEXA.

## 2013-08-29 NOTE — Telephone Encounter (Signed)
Received fax from [facility] that Freestyle lite rx needs dx code and specific directions.  Information was re-sent electronically on 08/25/13.  Left detailed message on pharmacy voicemail that we send information through eRx on 08/25/13 and to call if any further concerns.

## 2013-09-02 ENCOUNTER — Telehealth: Payer: Self-pay | Admitting: Family

## 2013-09-02 NOTE — Telephone Encounter (Signed)
Dx Borderline DM.

## 2013-09-02 NOTE — Telephone Encounter (Signed)
The diagnosis code for the test strips will not go through on part d.  They need a different diagnosis code or the patient will have to pay full price for the rx

## 2013-09-02 NOTE — Telephone Encounter (Signed)
790.29 is the code that comes up for borderline diabetes and that is the dx code I submitted with rx. Do you see another dx code?

## 2013-09-02 NOTE — Telephone Encounter (Signed)
I used hyperglycemia (790.29). Do you know if there is another code we can use?

## 2013-09-03 NOTE — Telephone Encounter (Signed)
No ,sorry.

## 2013-09-03 NOTE — Telephone Encounter (Signed)
Left detailed message on pharmacy voicemail that we do not have any additional code other than 790.29 as pt has not been diagnosed with diabetes to submit with glucometer test strips. Attempted to reach pt and was unable to leave message; no answer, no voicemail. Left detailed message on pt's cell#.

## 2013-09-18 ENCOUNTER — Encounter: Payer: Self-pay | Admitting: Family

## 2013-09-18 ENCOUNTER — Telehealth: Payer: Self-pay | Admitting: Family

## 2013-09-18 NOTE — Telephone Encounter (Signed)
Message copied by [name] on Thu Sep 18, 2013 10:00 AM ------      Message from: Hendricks Limes      Created: Thu Sep 18, 2013  8:09 AM       T score -0.5 , normal. High T scores in spine invalid. Monitor forearm & hip in future unless S/P THR      ----- Message -----         From: [name], NP         Sent: 09/17/2013   9:24 PM           To: Hendricks Limes, MD            Hi Hopp,             Happy New year!  You read bone density on him.  I don't see T score for Forearm?  Is that result available or am I missing it?             Thanks,            Kela Baccari       ------

## 2013-09-19 NOTE — Telephone Encounter (Signed)
Received additional fax from pharmacy stating we sent freestyle test strip rx with wrong dx code.  Spoke with pharmacist and advised her that 790.29 is the only code we currently have as pt is not diabetic but has had some elevated glucose readings. She states she will notify pt and see if he wants to purchase the OTC relion brand as it would be more cost effective.

## 2013-10-09 LAB — TSH: TSH: 2.063 u[IU]/mL (ref 0.350–4.500)

## 2013-10-13 ENCOUNTER — Encounter: Payer: Self-pay | Admitting: Family

## 2013-10-27 MED ORDER — LEVOTHYROXINE SODIUM 150 MCG PO TABS: 150.0000 ug | ORAL_TABLET | Freq: Every day | ORAL | Status: DC

## 2013-11-17 ENCOUNTER — Other Ambulatory Visit: Payer: Self-pay | Admitting: Family

## 2013-12-02 ENCOUNTER — Ambulatory Visit (INDEPENDENT_AMBULATORY_CARE_PROVIDER_SITE_OTHER): Payer: Medicare Other | Admitting: Family

## 2013-12-02 ENCOUNTER — Encounter: Payer: Self-pay | Admitting: Family

## 2013-12-02 VITALS — BP 126/88 | HR 57 | Temp 97.6°F | Ht 67.0 in | Wt 184.0 lb

## 2013-12-02 DIAGNOSIS — M109 Gout, unspecified: Secondary | ICD-10-CM

## 2013-12-02 DIAGNOSIS — E7800 Pure hypercholesterolemia, unspecified: Secondary | ICD-10-CM

## 2013-12-02 DIAGNOSIS — E785 Hyperlipidemia, unspecified: Secondary | ICD-10-CM

## 2013-12-02 DIAGNOSIS — I10 Essential (primary) hypertension: Secondary | ICD-10-CM

## 2013-12-02 DIAGNOSIS — E039 Hypothyroidism, unspecified: Secondary | ICD-10-CM

## 2013-12-02 MED ORDER — OMEPRAZOLE 20 MG PO CPDR: 20.0000 mg | DELAYED_RELEASE_CAPSULE | Freq: Every morning | ORAL | Status: DC

## 2013-12-02 NOTE — Patient Instructions (Signed)
Please complete lab work prior to leaving. Follow up in 3 months.  

## 2013-12-02 NOTE — Progress Notes (Signed)
Pre visit review using our clinic review tool, if applicable. No additional management support is needed unless otherwise documented below in the visit note. 

## 2013-12-02 NOTE — Assessment & Plan Note (Signed)
Stable on current dose of synthroid, continue same.  

## 2013-12-02 NOTE — Assessment & Plan Note (Signed)
Stable on allopurinol

## 2013-12-02 NOTE — Progress Notes (Signed)
Subjective:    Patient ID: [name] Pratt, male    DOB: Aug 26, 1941, 73 y.o.   MRN: [id number]  HPI  [name] is a 73 yr old male who presents today for follow up.  1) HTN- hydralazine was added to his regimen back in December. He is also maintained on metoprolol and losartan.  BP Readings from Last 3 Encounters:  12/02/13 126/88  08/25/13 146/84  07/18/13 126/74   2) Hyperlipidemia- currently maintained on pravastatin and lopid.  LDL was at goal in December.   3) Gout- currently on allopurinol. Denies gout flare since last visit.   4) Hyperglycemia-  A1C was slightly elevated in December at 6.   5) Hypothyroid- most recent TSH after med adjustment was in range.   Review of Systems    does report fatigue.  Wearing cpap  Past Medical History  Diagnosis Date  . Hyperlipidemia   . Hypertension   . Chest pain      Myoview in 2008 was normal.  Echo 7/09: EF 60%, normal wall motion, mild LVH, mild LAE.  [name] Nephrolithiasis   . Hiatal hernia   . Ileus   . Inguinal hernia   . Pancreatitis   . Rosacea   . Gout   . Arthritis   . History of chicken pox   . GERD (gastroesophageal reflux disease)   . Thyroid disease     hypothyroidism  . History of hemorrhoids   . OSA (obstructive sleep apnea)   . Carotid stenosis     a. Carotid U/S 2/69: LICA < 48%, RICA 54-62%;  b.  Carotid U/S 7/03:  RICA 50-09%; LICA 3-81%; f/u 1 year    History   Social History  . Marital Status: Married    Spouse Name: N/A    Number of Children: 1  . 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 Smoking status: Former Smoker -- 11 years    Types: Cigarettes    Quit date: 09/11/1978  . Smokeless tobacco: Never Used  . Alcohol Use: 0.6 oz/week    1 Glasses of wine per week     Comment: 1 per month  . Drug Use: Not on file  . Sexual Activity: Not on file   Other Topics Concern  . Not on file   Social History  Narrative   Lives with his wife   He has one son- lives locally.   2 grandchildren   Has worked for fire department   Part time pain business   Enjoys wood working   Completed HS.  [employer]    Past Surgical History  Procedure Laterality Date  . Laparoscopic cholecystectomy  09/20/06    with intraoperative cholangiogram and right inguinal herniorrhaphy with mesh   . Hiatal hernia repair  12/21/93  . Knee surgery  1995  . Hemorrhoid surgery    . Esophageal dilation    . Lithotripsy    . Inguinal hernia repair Right 07/15/12    Dr [name]  . Abdominal hernia repair  07/15/12    Dr [name]  . Nasal septum surgery  1975  . Esophageal dilation  1993 and 1994  . Brow lift  05/07/01  . Eye surgery Left 03/25/10    cataract removal  . Urethral dilation  1991    [name]  . Epidural injections      multiple procedures  .  Joint replacement  07/25/04    right knee  . Joint replacement  07/13/10    left  . 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 Ligament repair Left 07/2013    shoulder    Family History  Problem Relation Age of Onset  . Hypertension    . Cancer    . Osteoarthritis      Allergies  Allergen Reactions  . Metoclopramide Hcl     anxiety    Current Outpatient Prescriptions on File Prior to Visit  Medication Sig Dispense Refill  . albuterol (PROVENTIL HFA;VENTOLIN HFA) 108 (90 BASE) MCG/ACT inhaler Inhale 2 puffs into the lungs every 6 (six) hours as needed for shortness of breath.      . allopurinol (ZYLOPRIM) 100 MG tablet Take 1 tablet (100 mg total) by mouth daily.  90 tablet  1  . allopurinol (ZYLOPRIM) 300 MG tablet Take 1 tablet (300 mg total) by mouth daily.  90 tablet  1  . amLODipine (NORVASC) 10 MG tablet TAKE ONE TABLET BY MOUTH ONCE DAILY  90 tablet  1  . aspirin 81 MG tablet Take 81 mg by mouth daily.      . cycloSPORINE (RESTASIS) 0.05 % ophthalmic emulsion Place 1 drop into both eyes 2 (two) times daily.      . diclofenac sodium (VOLTAREN) 1 % GEL  Apply 1 application topically daily as needed (arthritis).      . FIBER FORMULA CAPS Take 1 capsule by mouth 2 (two) times daily.      [name] gemfibrozil (LOPID) 600 MG tablet Take 0.5 tablets (300 mg total) by mouth 2 (two) times daily before a meal.  180 tablet  1  . glucose blood test strip Use as instructed to test blood sugar 1 - 2 times daily.  100 each  12  . hydrALAZINE (APRESOLINE) 25 MG tablet Take 1 tablet (25 mg total) by mouth 3 (three) times daily.  90 tablet  5  . HYDROcodone-acetaminophen (NORCO/VICODIN) 5-325 MG per tablet Take 1 tablet by mouth every 6 (six) hours as needed for pain.  30 tablet  0  . levothyroxine (SYNTHROID, LEVOTHROID) 150 MCG tablet Take 1 tablet (150 mcg total) by mouth daily.  30 tablet  1  . losartan (COZAAR) 100 MG tablet Take 1 tablet (100 mg total) by mouth daily.  90 tablet  1  . metoprolol succinate (TOPROL-XL) 25 MG 24 hr tablet Take 1 tablet (25 mg total) by mouth daily.  30 tablet  5  . metroNIDAZOLE (METROCREAM) 0.75 % cream Apply 1 application topically daily as needed (rosacia).      . multivitamin (THERAGRAN) per tablet Take 1 tablet by mouth daily.        [name] omeprazole (PRILOSEC) 20 MG capsule Take 1 capsule (20 mg total) by mouth every morning.  90 capsule  1  . OVER THE COUNTER MEDICATION Take 1 tablet by mouth daily as needed. Allergy and decongestant pill      . OVER THE COUNTER MEDICATION Place 1 application into both eyes daily as needed (dry eyes). No Tears Eye Ointment      . pravastatin (PRAVACHOL) 40 MG tablet TAKE ONE TABLET BY MOUTH ONCE DAILY  90 tablet  1  . sodium chloride (OCEAN) 0.65 % nasal spray Place 1 spray into both nostrils at bedtime as needed for congestion.       No current facility-administered medications on file prior to visit.    BP 126/88  Pulse 57  Temp(Src) 97.6 F (36.4 C) (Oral)  Ht 5\' 7"  (1.702 m)  Wt 184 lb (83.462 kg)  BMI 28.81 kg/m2  SpO2 96%    Objective:   Physical Exam  Constitutional: He is  oriented to person, place, and time. He appears well-developed and well-nourished. No distress.  HENT:  Head: Normocephalic and atraumatic.  Cardiovascular: Normal rate and regular rhythm.   No murmur heard. Pulmonary/Chest: Effort normal and breath sounds normal. No respiratory distress. He has no wheezes. He has no rales. He exhibits no tenderness.  Musculoskeletal: He exhibits no edema.  Neurological: He is alert and oriented to person, place, and time.  Psychiatric: He has a normal mood and affect. His behavior is normal. Judgment and thought content normal.          Assessment & Plan:

## 2013-12-02 NOTE — Assessment & Plan Note (Signed)
>>  ASSESSMENT AND PLAN FOR BENIGN ESSENTIAL HYPERTENSION WRITTEN ON 12/02/2013  9:37 PM BY O'SULLIVAN, Tiajuana Leppanen, NP  BP stable on current meds. Continue same.

## 2013-12-02 NOTE — Assessment & Plan Note (Signed)
BP stable on current meds. Continue same.  

## 2013-12-02 NOTE — Assessment & Plan Note (Signed)
Stable on pravastatin and lopid, continue same.

## 2013-12-02 NOTE — Assessment & Plan Note (Signed)
Stable on current meds 

## 2013-12-22 ENCOUNTER — Other Ambulatory Visit: Payer: Self-pay | Admitting: Family Medicine

## 2013-12-24 ENCOUNTER — Other Ambulatory Visit: Payer: Self-pay | Admitting: Family

## 2014-01-11 ENCOUNTER — Other Ambulatory Visit: Payer: Self-pay | Admitting: Family

## 2014-01-11 DIAGNOSIS — I10 Essential (primary) hypertension: Secondary | ICD-10-CM

## 2014-01-12 NOTE — Telephone Encounter (Signed)
Refill for Norvasc sent to [facility] on [address]

## 2014-01-17 ENCOUNTER — Encounter (HOSPITAL_BASED_OUTPATIENT_CLINIC_OR_DEPARTMENT_OTHER): Payer: Self-pay | Admitting: Emergency Medicine

## 2014-01-17 ENCOUNTER — Emergency Department (HOSPITAL_BASED_OUTPATIENT_CLINIC_OR_DEPARTMENT_OTHER): Payer: Medicare Other

## 2014-01-17 ENCOUNTER — Emergency Department (HOSPITAL_BASED_OUTPATIENT_CLINIC_OR_DEPARTMENT_OTHER)
Admission: EM | Admit: 2014-01-17 | Discharge: 2014-01-17 | Disposition: A | Discharge status: Home / Self Care | Payer: Medicare Other | Attending: Emergency Medicine | Admitting: Emergency Medicine

## 2014-01-17 DIAGNOSIS — R079 Chest pain, unspecified: Secondary | ICD-10-CM | POA: Insufficient documentation

## 2014-01-17 DIAGNOSIS — E039 Hypothyroidism, unspecified: Secondary | ICD-10-CM | POA: Insufficient documentation

## 2014-01-17 DIAGNOSIS — Z87891 Personal history of nicotine dependence: Secondary | ICD-10-CM | POA: Insufficient documentation

## 2014-01-17 DIAGNOSIS — I6529 Occlusion and stenosis of unspecified carotid artery: Secondary | ICD-10-CM | POA: Insufficient documentation

## 2014-01-17 DIAGNOSIS — K5900 Constipation, unspecified: Secondary | ICD-10-CM | POA: Insufficient documentation

## 2014-01-17 DIAGNOSIS — Z9889 Other specified postprocedural states: Secondary | ICD-10-CM | POA: Insufficient documentation

## 2014-01-17 DIAGNOSIS — R110 Nausea: Secondary | ICD-10-CM | POA: Insufficient documentation

## 2014-01-17 DIAGNOSIS — Z79899 Other long term (current) drug therapy: Secondary | ICD-10-CM | POA: Insufficient documentation

## 2014-01-17 DIAGNOSIS — Z8619 Personal history of other infectious and parasitic diseases: Secondary | ICD-10-CM | POA: Insufficient documentation

## 2014-01-17 DIAGNOSIS — Z7982 Long term (current) use of aspirin: Secondary | ICD-10-CM | POA: Insufficient documentation

## 2014-01-17 DIAGNOSIS — K219 Gastro-esophageal reflux disease without esophagitis: Secondary | ICD-10-CM | POA: Insufficient documentation

## 2014-01-17 DIAGNOSIS — I10 Essential (primary) hypertension: Secondary | ICD-10-CM | POA: Insufficient documentation

## 2014-01-17 DIAGNOSIS — Z9089 Acquired absence of other organs: Secondary | ICD-10-CM | POA: Insufficient documentation

## 2014-01-17 DIAGNOSIS — N200 Calculus of kidney: Secondary | ICD-10-CM | POA: Insufficient documentation

## 2014-01-17 DIAGNOSIS — E785 Hyperlipidemia, unspecified: Secondary | ICD-10-CM | POA: Insufficient documentation

## 2014-01-17 LAB — BASIC METABOLIC PANEL
BUN: 27 mg/dL — ABNORMAL HIGH (ref 6–23)
CO2: 22 mEq/L (ref 19–32)
Calcium: 10 mg/dL (ref 8.4–10.5)
Chloride: 101 mEq/L (ref 96–112)
Creatinine, Ser: 1.4 mg/dL — ABNORMAL HIGH (ref 0.50–1.35)
GFR calc non Af Amer: 48 mL/min — ABNORMAL LOW (ref >90)
GFR, EST AFRICAN AMERICAN: 56 mL/min — AB (ref >90)
Glucose, Bld: 158 mg/dL — ABNORMAL HIGH (ref 70–99)
Potassium: 4.1 mEq/L (ref 3.7–5.3)
Sodium: 141 mEq/L (ref 137–147)

## 2014-01-17 LAB — CBC WITH DIFFERENTIAL/PLATELET
BASOS ABS: 0 10*3/uL (ref 0.0–0.1)
BASOS PCT: 0 % (ref 0–1)
EOS PCT: 0 % (ref 0–5)
Eosinophils Absolute: 0 10*3/uL (ref 0.0–0.7)
HCT: 40.1 % (ref 39.0–52.0)
Hemoglobin: 13.8 g/dL (ref 13.0–17.0)
Lymphocytes Relative: 11 % — ABNORMAL LOW (ref 12–46)
Lymphs Abs: 1.5 10*3/uL (ref 0.7–4.0)
MCH: 33.1 pg (ref 26.0–34.0)
MCHC: 34.4 g/dL (ref 30.0–36.0)
MCV: 96.2 fL (ref 78.0–100.0)
MONO ABS: 0.8 10*3/uL (ref 0.1–1.0)
Monocytes Relative: 6 % (ref 3–12)
NEUTROS ABS: 11 10*3/uL — AB (ref 1.7–7.7)
Neutrophils Relative %: 83 % — ABNORMAL HIGH (ref 43–77)
Platelets: 331 10*3/uL (ref 150–400)
RBC: 4.17 MIL/uL — ABNORMAL LOW (ref 4.22–5.81)
RDW: 12.5 % (ref 11.5–15.5)
WBC: 13.3 10*3/uL — ABNORMAL HIGH (ref 4.0–10.5)

## 2014-01-17 LAB — URINALYSIS, ROUTINE W REFLEX MICROSCOPIC
Bilirubin Urine: NEGATIVE
GLUCOSE, UA: NEGATIVE mg/dL
Ketones, ur: NEGATIVE mg/dL
LEUKOCYTES UA: NEGATIVE
Nitrite: NEGATIVE
PH: 6.5 (ref 5.0–8.0)
Protein, ur: 100 mg/dL — AB
Specific Gravity, Urine: 1.015 (ref 1.005–1.030)
Urobilinogen, UA: 0.2 mg/dL (ref 0.0–1.0)

## 2014-01-17 LAB — URINE MICROSCOPIC-ADD ON

## 2014-01-17 MED ORDER — TAMSULOSIN HCL 0.4 MG PO CAPS: 0.4000 mg | ORAL_CAPSULE | Freq: Every day | ORAL | Status: DC

## 2014-01-17 MED ORDER — SODIUM CHLORIDE 0.9 % IV BOLUS (SEPSIS)
500.0000 mL | Freq: Once | INTRAVENOUS | Status: AC
  Administered 2014-01-17: 500 mL via INTRAVENOUS

## 2014-01-17 MED ORDER — KETOROLAC TROMETHAMINE 30 MG/ML IJ SOLN
30.0000 mg | Freq: Once | INTRAMUSCULAR | Status: AC
Start: 2014-01-17 — End: 2014-01-17
  Administered 2014-01-17: 30 mg via INTRAVENOUS
  Filled 2014-01-17: qty 1

## 2014-01-17 MED ORDER — ONDANSETRON 8 MG PO TBDP: ORAL_TABLET | ORAL | Status: DC

## 2014-01-17 MED ORDER — ONDANSETRON HCL 4 MG/2ML IJ SOLN
4.0000 mg | Freq: Once | INTRAMUSCULAR | Status: AC
  Administered 2014-01-17: 4 mg via INTRAVENOUS
  Filled 2014-01-17: qty 2

## 2014-01-17 MED ORDER — OXYCODONE-ACETAMINOPHEN 5-325 MG PO TABS: 1.0000 | ORAL_TABLET | Freq: Four times a day (QID) | ORAL | Status: DC | PRN

## 2014-01-17 MED ORDER — DICYCLOMINE HCL 10 MG/ML IM SOLN
20.0000 mg | Freq: Once | INTRAMUSCULAR | Status: AC
  Administered 2014-01-17: 20 mg via INTRAMUSCULAR
  Filled 2014-01-17: qty 2

## 2014-01-17 NOTE — Discharge Instructions (Signed)
Diet for Kidney Stones  Kidney stones are small, hard masses that form inside your kidneys. They are made up of salts and minerals and often form when high levels build up in the urine. The minerals can then start to build up, crystalize, and stick together to form stones. There are several different types of kidney stones. The following types of stones may be influenced by dietary factors:   · Calcium Oxalate Stones. An oxalate is a salt found in certain foods. Within the body, calcium can combine with oxalates to form calcium oxalate stones, which can be excreted in the urine in high amounts. This is the most common type of kidney stone.  · Calcium Phosphate Stones. These stones may occur when the pH of the urine becomes too high, or less acidic, from too much calcium being excreted in the urine. The pH is a measure of how acidic or basic a substance is.  · Uric Acid Stones. This type of stone occurs when the pH of the urine becomes too low, or very acidic, because substances called purines build up in the urine. Purines are found in animal proteins. When the urine is highly concentrated with acid, uric acid kidney stones can form.    Other risk factors for kidney stones include genetics, environment, and being overweight. Your caregiver may ask you to follow specific diet guidelines based on the type of stone you have to lessen the chances of your body making more kidney stones.   GENERAL GUIDELINES FOR ALL TYPES OF STONES  · Drink plenty of fluid. Drink 12 16 cups of fluid a day, drinking mainly water. This is the most important thing you can do to prevent the formation of future kidney stones.  · Maintain a healthy weight. Your caregiver or dietitian can help you determine what a healthy weight is for you. If you are overweight, weight loss may help prevent the formation of future kidney stones.  · Eat a diet adequate in animal protein. Too much animal protein can contribute to the formation of stones. Your  dietitian can help you determine how much protein you should be eating. Avoid low carbohydrate, high protein diets.  · Follow a balanced eating approach. The DASH diet, which stands for "Dietary Approaches to Stop Hypertension," is an effective meal plan for reducing stone formation. This diet is high in fruits, vegetables, dairy, and whole grains and low in animal protein. Ask your caregiver or dietitian for information about the DASH diet.  ADDITIONAL DIET GUIDELINES FOR CALCIUM STONES  Avoid foods high in salt. This includes table salt, salt seasonings, MSG, soy sauce, cured and processed meats, salted crackers and snack foods, fast food, and canned soups and foods. Ask your caregiver or dietitian for information about reducing sodium in your diet or following the low sodium diet.   Ensure adequate calcium intake. Use the following table for calcium guidelines:  · Men 65 years old and younger  1000 mg/day.  · Men 65 years old and older  1500 mg/day.  · Women 25 74 years old  1000 mg/day.  · Women 50 years and older  1500 mg/day.  Your dietitian can help you determine if you are getting enough calcium in your diet. Foods that are high in calcium include dairy products, broccoli, cheese, yogurt, and pudding. If you need to take a calcium supplement, take it only in the form of calcium citrate.   Avoid foods high in oxalate. Be sure that any supplements you take do not   contain more than 500 mg of vitamin C. Vitamin C is converted into oxalate in the body. You do not need to avoid fruits and vegetables high in vitamin C.   · Grains: High-fiber or bran cereal, whole-wheat bread, grits, barley, buckwheat, amaranth, pretzels, and fruitcake.  · Vegetables: Dried beans, wax beans, dark leafy greens, eggplant, leeks, okra, parsley, rutabaga, tomato paste, watercress, zucchini, and escarole.  · Fruit: Dried apricots, red currants, figs, kiwi, and rhubarb.  · Meat and Meat Substitutes: Soybeans and foods made from soy  (soyburger, miso), dried beans, peanut butter.  · Milk: Chocolate milk mixes and soymilk.  · Fats and Oils: Nuts (peanuts, almonds, pecans, cashews, hazelnuts) and nut butters, sesame seeds, and tDahini paste.  · Condiments/Miscellaneous: Chocolate, carob, marmalade, poppy seeds, instant iced tea, and juice from high-oxalate fruits.     Document Released: 12/23/2010 Document Revised: 02/27/2012 Document Reviewed: 02/12/2012  ExitCare® Patient Information ©2014 [employer].

## 2014-01-17 NOTE — ED Notes (Signed)
Patient transported to CT 

## 2014-01-17 NOTE — ED Notes (Signed)
7pm yesterday pt started feeling stomach pressure.  Felt like he needed to have a bm but could not.  Nausea but no vomiting.  Abd distended.

## 2014-01-17 NOTE — ED Provider Notes (Signed)
CSN: [id number]     Arrival date & time 01/17/14  0340 History   First MD Initiated Contact with Patient 01/17/14 0350     Chief Complaint  Patient presents with  . Abdominal Pain     (Consider location/radiation/quality/duration/timing/severity/associated sxs/prior Treatment) Patient is a 73 y.o. male presenting with abdominal pain. The history is provided by the patient.  Abdominal Pain Pain location:  Generalized Pain quality: cramping   Pain radiates to:  Does not radiate Pain severity:  Moderate Onset quality:  Gradual Timing:  Intermittent Progression:  Unchanged Chronicity:  New Context: eating   Relieved by:  Nothing Worsened by:  Nothing tried Ineffective treatments:  None tried Associated symptoms: constipation and nausea   Associated symptoms: no diarrhea and no vomiting   Risk factors: no recent hospitalization     Past Medical History  Diagnosis Date  . Hyperlipidemia   . Hypertension   . Chest pain      Myoview in 2008 was normal.  Echo 7/09: EF 60%, normal wall motion, mild LVH, mild LAE.  [name] Nephrolithiasis   . Hiatal hernia   . Ileus   . Inguinal hernia   . Pancreatitis   . Rosacea   . Gout   . Arthritis   . History of chicken pox   . GERD (gastroesophageal reflux disease)   . Thyroid disease     hypothyroidism  . History of hemorrhoids   . OSA (obstructive sleep apnea)   . Carotid stenosis     a. Carotid U/S 7/16: LICA < 96%, RICA 78-93%;  b.  Carotid U/S 8/10:  RICA 17-51%; LICA 0-25%; f/u 1 year   Past Surgical History  Procedure Laterality Date  . Laparoscopic cholecystectomy  09/20/06    with intraoperative cholangiogram and right inguinal herniorrhaphy with mesh   . Hiatal hernia repair  12/21/93  . Knee surgery  1995  . Hemorrhoid surgery    . Esophageal dilation    . Lithotripsy    . Inguinal hernia repair Right 07/15/12    Dr [name]  . Abdominal hernia repair  07/15/12    Dr [name]  . Nasal septum surgery  1975  . Esophageal  dilation  1993 and 1994  . Brow lift  05/07/01  . Eye surgery Left 03/25/10    cataract removal  . Urethral dilation  1991    [name]  . Epidural injections      multiple procedures  . Joint replacement  07/25/04    right knee  . Joint replacement  07/13/10    left  . 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 Ligament repair Left 07/2013    shoulder   Family History  Problem Relation Age of Onset  . Hypertension    . Cancer    . Osteoarthritis     History  Substance Use Topics  . Smoking status: Former Smoker -- 11 years    Types: Cigarettes    Quit date: 09/11/1978  . Smokeless tobacco: Never Used  . Alcohol Use: 0.6 oz/week    1 Glasses of wine per week     Comment: 1 per month    Review of Systems  Gastrointestinal: Positive for nausea, abdominal pain and constipation. Negative for vomiting and diarrhea.  All other systems reviewed and are negative.     Allergies  Metoclopramide hcl  Home Medications   Prior to Admission medications   Medication Sig Start Date End Date Taking? Authorizing Provider  albuterol (PROVENTIL HFA;VENTOLIN  HFA) 108 (90 BASE) MCG/ACT inhaler Inhale 2 puffs into the lungs every 6 (six) hours as needed for shortness of breath.    Historical Provider, MD  allopurinol (ZYLOPRIM) 300 MG tablet TAKE ONE TABLET BY MOUTH ONCE DAILY 12/24/13   [name], NP  amLODipine (NORVASC) 10 MG tablet TAKE ONE TABLET BY MOUTH ONCE DAILY    [name], NP  aspirin 81 MG tablet Take 81 mg by mouth daily.    Historical Provider, MD  cycloSPORINE (RESTASIS) 0.05 % ophthalmic emulsion Place 1 drop into both eyes 2 (two) times daily.    Historical Provider, MD  diclofenac sodium (VOLTAREN) 1 % GEL Apply 1 application topically daily as needed (arthritis).    Historical Provider, MD  FIBER FORMULA CAPS Take 1 capsule by mouth 2 (two) times daily.    Historical Provider, MD  gemfibrozil (LOPID) 600 MG tablet Take 0.5 tablets (300 mg total) by mouth 2  (two) times daily before a meal. 05/26/13   [name], NP  glucose blood test strip Use as instructed to test blood sugar 1 - 2 times daily. 08/25/13   [name], NP  hydrALAZINE (APRESOLINE) 25 MG tablet Take 1 tablet (25 mg total) by mouth 3 (three) times daily. 08/25/13   [name], NP  HYDROcodone-acetaminophen (NORCO/VICODIN) 5-325 MG per tablet Take 1 tablet by mouth every 6 (six) hours as needed for pain. 01/29/13   [name], NP  levothyroxine (SYNTHROID, LEVOTHROID) 150 MCG tablet TAKE ONE TABLET BY MOUTH ONCE DAILY    Mosie Lukes, MD  losartan (COZAAR) 100 MG tablet Take 1 tablet (100 mg total) by mouth daily. 05/26/13   [name], NP  metoprolol succinate (TOPROL-XL) 25 MG 24 hr tablet Take 1 tablet (25 mg total) by mouth daily. 07/18/13   [name], NP  metroNIDAZOLE (METROCREAM) 0.75 % cream Apply 1 application topically daily as needed (rosacia).    Historical Provider, MD  multivitamin [facility]) per tablet Take 1 tablet by mouth daily.      Historical Provider, MD  omeprazole (PRILOSEC) 20 MG capsule Take 1 capsule (20 mg total) by mouth every morning. 12/02/13   [name], NP  OVER THE COUNTER MEDICATION Take 1 tablet by mouth daily as needed. Allergy and decongestant pill    Historical Provider, MD  OVER THE COUNTER MEDICATION Place 1 application into both eyes daily as needed (dry eyes). No Tears Eye Ointment    Historical Provider, MD  pravastatin (PRAVACHOL) 40 MG tablet TAKE ONE TABLET BY MOUTH ONCE DAILY 11/17/13   [name], NP  sodium chloride (OCEAN) 0.65 % nasal spray Place 1 spray into both nostrils at bedtime as needed for congestion.    Historical Provider, MD   BP 198/78  Pulse 58  Temp(Src) 98.6 F (37 C) (Oral)  Resp 20  Ht 5\' 7"  (1.702 m)  Wt 180 lb (81.647 kg)  BMI 28.19 kg/m2  SpO2 96% Physical Exam  Constitutional: He is oriented to person, place, and time. He appears well-developed and  well-nourished. No distress.  HENT:  Head: Normocephalic and atraumatic.  Mouth/Throat: Oropharynx is clear and moist.  Eyes: Conjunctivae are normal. Pupils are equal, round, and reactive to light.  Neck: Normal range of motion. Neck supple.  Cardiovascular: Normal rate, regular rhythm and intact distal pulses.   Pulmonary/Chest: Effort normal and breath sounds normal. He has no wheezes. He has no rales.  Abdominal: Soft. Bowel sounds are increased. There is no tenderness. There is no  rebound and no guarding.  Musculoskeletal: Normal range of motion.  Neurological: He is alert and oriented to person, place, and time.  Skin: Skin is warm and dry.  Psychiatric: He has a normal mood and affect.    ED Course  Procedures (including critical care time) Labs Review Labs Reviewed  CBC WITH DIFFERENTIAL  BASIC METABOLIC PANEL    Imaging Review No results found.   EKG Interpretation None      MDM   Final diagnoses:  None      Kidney stone, will treat for kidney stone.  Percocet, zofran and flomax. Strain all urine and follow up with urology for ongoing care.  Patient verbalizes understanding and agrees to follow up    Kip Kautzman Alfonso Patten, MD 01/17/14 [id number]

## 2014-01-17 NOTE — ED Notes (Signed)
MD at bedside. 

## 2014-01-17 NOTE — ED Notes (Signed)
Pt spitting up bile and c/o nausea. EDP made aware and new orders rec'd.

## 2014-01-21 ENCOUNTER — Other Ambulatory Visit: Payer: Self-pay | Admitting: Urology

## 2014-01-22 ENCOUNTER — Encounter (HOSPITAL_COMMUNITY): Payer: Self-pay | Admitting: Pharmacy Technician

## 2014-01-23 ENCOUNTER — Encounter (HOSPITAL_COMMUNITY)
Admission: RE | Admit: 2014-01-23 | Discharge: 2014-01-23 | Disposition: A | Discharge status: Home / Self Care | Payer: Medicare Other | Source: Ambulatory Visit | Attending: Urology | Admitting: Urology

## 2014-01-23 ENCOUNTER — Encounter (HOSPITAL_COMMUNITY): Payer: Self-pay

## 2014-01-23 DIAGNOSIS — Z01810 Encounter for preprocedural cardiovascular examination: Secondary | ICD-10-CM | POA: Insufficient documentation

## 2014-01-23 DIAGNOSIS — Z01812 Encounter for preprocedural laboratory examination: Secondary | ICD-10-CM | POA: Insufficient documentation

## 2014-01-23 HISTORY — DX: Hypothyroidism, unspecified: E03.9

## 2014-01-23 HISTORY — DX: Personal history of urinary calculi: Z87.442

## 2014-01-23 HISTORY — DX: Malignant (primary) neoplasm, unspecified: C80.1

## 2014-01-23 HISTORY — DX: Other complications of anesthesia, initial encounter: T88.59XA

## 2014-01-23 HISTORY — DX: Chronic obstructive pulmonary disease, unspecified: J44.9

## 2014-01-23 HISTORY — DX: Nausea with vomiting, unspecified: R11.2

## 2014-01-23 HISTORY — DX: Other specified postprocedural states: Z98.890

## 2014-01-23 HISTORY — DX: Adverse effect of unspecified anesthetic, initial encounter: T41.45XA

## 2014-01-23 NOTE — Progress Notes (Signed)
CBC with diff and BMET results 01/17/14 on EPIC, Abd acute with chest 01/17/14 on EPIC

## 2014-01-23 NOTE — Patient Instructions (Signed)
[name] Pratt  01/23/2014   Your procedure is scheduled on: 01/28/14  Report to [facility] at 6:30 AM.  Call this number if you have problems the morning of surgery [phone]: [phone]   Remember: please bring inhaler on day of surgery   Do not eat food or drink liquids After Midnight.     Take these medicines the morning of surgery with A SIP OF WATER: albuterol, allopurinol, eye drops, lopid, hydralazine, synthroid, prilosec   Do not wear jewelry, make-up or nail polish.  Do not wear lotions, powders, or perfumes. You may wear deodorant.  Do not shave 48 hours prior to surgery. Men may shave face and neck.  Do not bring valuables to the hospital.  Contacts, dentures or bridgework may not be worn into surgery.   Patients discharged the day of surgery will not be allowed to drive home.  Name and phone number of your driver: Pamala Hurry [phone]    Paulette Blanch, RN  pre op nurse call if needed [phone]    [facility] - Preparing for Surgery Before surgery, you can play an important role.  Because skin is not sterile, your skin needs to be as free of germs as possible.  You can reduce the number of germs on your skin by washing with CHG (chlorahexidine gluconate) soap before surgery.  CHG is an antiseptic cleaner which kills germs and bonds with the skin to continue killing germs even after washing. Please DO NOT use if you have an allergy to CHG or antibacterial soaps.  If your skin becomes reddened/irritated stop using the CHG and inform your nurse when you arrive at Short Stay. Do not shave (including legs and underarms) for at least 48 hours prior to the first CHG shower.  You may shave your face. Please follow these instructions carefully:  1.  Shower with CHG Soap the night before surgery and the  morning of Surgery.  2.  If you choose to wash your hair, wash your hair first as usual with your  normal  shampoo.  3.  After you shampoo, rinse your hair and  body thoroughly to remove the  shampoo.                            4.  Use CHG as you would any other liquid soap.  You can apply chg directly  to the skin and wash                       Gently with a scrungie or clean washcloth.  5.  Apply the CHG Soap to your body ONLY FROM THE NECK DOWN.   Do not use on open                           Wound or open sores. Avoid contact with eyes, ears mouth and genitals (private parts).                        Genitals (private parts) with your normal soap.             6.  Wash thoroughly, paying special attention to the area where your surgery  will be performed.  7.  Thoroughly rinse your body with warm water from the neck down.  8.  DO NOT shower/wash with your normal soap after using  and rinsing off  the CHG Soap.                9.  Pat yourself dry with a clean towel.            10.  Wear clean pajamas.            11.  Place clean sheets on your bed the night of your first shower and do not  sleep with pets. Day of Surgery : Do not apply any lotions/deodorants the morning of surgery.  Please wear clean clothes to the hospital/surgery center.  FAILURE TO FOLLOW THESE INSTRUCTIONS MAY RESULT IN THE CANCELLATION OF YOUR SURGERY PATIENT SIGNATURE_________________________________  NURSE SIGNATURE__________________________________  ________________________________________________________________________

## 2014-01-26 ENCOUNTER — Encounter (HOSPITAL_BASED_OUTPATIENT_CLINIC_OR_DEPARTMENT_OTHER): Admission: RE | Payer: Self-pay | Source: Ambulatory Visit

## 2014-01-26 ENCOUNTER — Other Ambulatory Visit: Payer: Self-pay | Admitting: Family

## 2014-01-26 ENCOUNTER — Ambulatory Visit (HOSPITAL_BASED_OUTPATIENT_CLINIC_OR_DEPARTMENT_OTHER): Admission: RE | Admit: 2014-01-26 | Payer: Medicare Other | Source: Ambulatory Visit | Admitting: Urology

## 2014-01-26 SURGERY — CYSTOURETEROSCOPY, WITH RETROGRADE PYELOGRAM AND STENT INSERTION
Anesthesia: General | Laterality: Right

## 2014-01-26 NOTE — Telephone Encounter (Signed)
Message from pharmacy: patient is requesting a 90 day supply

## 2014-01-28 ENCOUNTER — Encounter (HOSPITAL_COMMUNITY): Payer: Self-pay | Admitting: *Deleted

## 2014-01-28 ENCOUNTER — Ambulatory Visit (HOSPITAL_COMMUNITY): Payer: Medicare Other | Admitting: Anesthesiology

## 2014-01-28 ENCOUNTER — Ambulatory Visit (HOSPITAL_COMMUNITY)
Admission: RE | Admit: 2014-01-28 | Discharge: 2014-01-28 | Disposition: A | Discharge status: Home / Self Care | Payer: Medicare Other | Source: Ambulatory Visit | Attending: Urology | Admitting: Urology

## 2014-01-28 ENCOUNTER — Encounter (HOSPITAL_COMMUNITY)
Admission: RE | Disposition: A | Discharge status: Home / Self Care | Payer: Self-pay | Source: Ambulatory Visit | Attending: Urology

## 2014-01-28 ENCOUNTER — Encounter (HOSPITAL_COMMUNITY): Payer: Medicare Other | Admitting: Anesthesiology

## 2014-01-28 DIAGNOSIS — I10 Essential (primary) hypertension: Secondary | ICD-10-CM | POA: Insufficient documentation

## 2014-01-28 DIAGNOSIS — K219 Gastro-esophageal reflux disease without esophagitis: Secondary | ICD-10-CM | POA: Diagnosis not present

## 2014-01-28 DIAGNOSIS — Z87891 Personal history of nicotine dependence: Secondary | ICD-10-CM | POA: Diagnosis not present

## 2014-01-28 DIAGNOSIS — N200 Calculus of kidney: Secondary | ICD-10-CM | POA: Diagnosis not present

## 2014-01-28 DIAGNOSIS — N201 Calculus of ureter: Secondary | ICD-10-CM | POA: Diagnosis not present

## 2014-01-28 DIAGNOSIS — I6529 Occlusion and stenosis of unspecified carotid artery: Secondary | ICD-10-CM | POA: Diagnosis not present

## 2014-01-28 DIAGNOSIS — J4489 Other specified chronic obstructive pulmonary disease: Secondary | ICD-10-CM | POA: Insufficient documentation

## 2014-01-28 DIAGNOSIS — G4730 Sleep apnea, unspecified: Secondary | ICD-10-CM | POA: Diagnosis not present

## 2014-01-28 DIAGNOSIS — J449 Chronic obstructive pulmonary disease, unspecified: Secondary | ICD-10-CM | POA: Insufficient documentation

## 2014-01-28 HISTORY — PX: CYSTOSCOPY WITH URETEROSCOPY AND STENT PLACEMENT: SHX6377

## 2014-01-28 HISTORY — PX: HOLMIUM LASER APPLICATION: SHX5852

## 2014-01-28 SURGERY — CYSTOURETEROSCOPY, WITH STENT INSERTION
Anesthesia: General | Laterality: Right

## 2014-01-28 MED ORDER — DEXAMETHASONE SODIUM PHOSPHATE 10 MG/ML IJ SOLN
INTRAMUSCULAR | Status: DC | PRN
  Administered 2014-01-28: 10 mg via INTRAVENOUS

## 2014-01-28 MED ORDER — DEXAMETHASONE SODIUM PHOSPHATE 10 MG/ML IJ SOLN
INTRAMUSCULAR | Status: AC
  Filled 2014-01-28: qty 1

## 2014-01-28 MED ORDER — EPHEDRINE SULFATE 50 MG/ML IJ SOLN
INTRAMUSCULAR | Status: AC
  Filled 2014-01-28: qty 1

## 2014-01-28 MED ORDER — PROPOFOL 10 MG/ML IV BOLUS
INTRAVENOUS | Status: DC | PRN
  Administered 2014-01-28: 50 mg via INTRAVENOUS
  Administered 2014-01-28: 150 mg via INTRAVENOUS

## 2014-01-28 MED ORDER — ONDANSETRON HCL 4 MG/2ML IJ SOLN
INTRAMUSCULAR | Status: DC | PRN
  Administered 2014-01-28: 4 mg via INTRAVENOUS

## 2014-01-28 MED ORDER — CEFAZOLIN SODIUM-DEXTROSE 2-3 GM-% IV SOLR
2.0000 g | INTRAVENOUS | Status: AC
  Administered 2014-01-28: 2 g via INTRAVENOUS

## 2014-01-28 MED ORDER — SODIUM CHLORIDE 0.9 % IR SOLN
Status: DC | PRN
  Administered 2014-01-28: 1000 mL

## 2014-01-28 MED ORDER — PROPOFOL 10 MG/ML IV BOLUS
INTRAVENOUS | Status: AC
  Filled 2014-01-28: qty 20

## 2014-01-28 MED ORDER — LACTATED RINGERS IV SOLN: INTRAVENOUS | Status: DC

## 2014-01-28 MED ORDER — FENTANYL CITRATE 0.05 MG/ML IJ SOLN: 25.0000 ug | INTRAMUSCULAR | Status: DC | PRN

## 2014-01-28 MED ORDER — FENTANYL CITRATE 0.05 MG/ML IJ SOLN
INTRAMUSCULAR | Status: DC | PRN
  Administered 2014-01-28 (×3): 25 ug via INTRAVENOUS

## 2014-01-28 MED ORDER — SODIUM CHLORIDE 0.9 % IR SOLN
Status: DC | PRN
  Administered 2014-01-28: 3000 mL

## 2014-01-28 MED ORDER — FENTANYL CITRATE 0.05 MG/ML IJ SOLN
INTRAMUSCULAR | Status: AC
  Filled 2014-01-28: qty 5

## 2014-01-28 MED ORDER — SODIUM CHLORIDE 0.9 % IJ SOLN
INTRAMUSCULAR | Status: AC
  Filled 2014-01-28: qty 10

## 2014-01-28 MED ORDER — ONDANSETRON HCL 4 MG/2ML IJ SOLN
INTRAMUSCULAR | Status: AC
  Filled 2014-01-28: qty 2

## 2014-01-28 MED ORDER — EPHEDRINE SULFATE 50 MG/ML IJ SOLN
INTRAMUSCULAR | Status: DC | PRN
  Administered 2014-01-28 (×2): 10 mg via INTRAVENOUS

## 2014-01-28 MED ORDER — IOHEXOL 300 MG/ML  SOLN
INTRAMUSCULAR | Status: DC | PRN
  Administered 2014-01-28: 2 mL

## 2014-01-28 MED ORDER — LACTATED RINGERS IV SOLN
INTRAVENOUS | Status: DC
  Administered 2014-01-28: 10:00:00 via INTRAVENOUS

## 2014-01-28 MED ORDER — CEFAZOLIN SODIUM-DEXTROSE 2-3 GM-% IV SOLR
INTRAVENOUS | Status: AC
  Filled 2014-01-28: qty 50

## 2014-01-28 MED ORDER — LIDOCAINE HCL 2 % EX GEL
CUTANEOUS | Status: AC
  Filled 2014-01-28: qty 10

## 2014-01-28 MED ORDER — LACTATED RINGERS IV SOLN
INTRAVENOUS | Status: DC | PRN
Start: 2014-01-28 — End: 2014-01-28
  Administered 2014-01-28: 08:00:00 via INTRAVENOUS

## 2014-01-28 MED ORDER — LIDOCAINE HCL 2 % EX GEL
CUTANEOUS | Status: DC | PRN
  Administered 2014-01-28: 1 via URETHRAL

## 2014-01-28 SURGICAL SUPPLY — 16 items
BAG URO CATCHER STRL LF (DRAPE) ×3 IMPLANT
BASKET LASER NITINOL 1.9FR (BASKET) ×3 IMPLANT
BASKET ZERO TIP 1.9FR (BASKET) ×3 IMPLANT
BENZOIN TINCTURE PRP APPL 2/3 (GAUZE/BANDAGES/DRESSINGS) ×3 IMPLANT
CATH INTERMIT  6FR 70CM (CATHETERS) ×3 IMPLANT
DRAPE CAMERA CLOSED 9X96 (DRAPES) ×3 IMPLANT
DRSG TEGADERM 2-3/8X2-3/4 SM (GAUZE/BANDAGES/DRESSINGS) ×3 IMPLANT
FIBER LASER FLEXIVA 365 (UROLOGICAL SUPPLIES) ×3 IMPLANT
GOWN STRL NON-REIN LRG LVL3 (GOWN DISPOSABLE) ×3 IMPLANT
GOWN STRL REUS W/TWL XL LVL3 (GOWN DISPOSABLE) ×3 IMPLANT
GUIDEWIRE STR DUAL SENSOR (WIRE) ×3 IMPLANT
MANIFOLD NEPTUNE II (INSTRUMENTS) ×3 IMPLANT
PACK CYSTO (CUSTOM PROCEDURE TRAY) ×3 IMPLANT
STENT CONTOUR 6FRX24X.038 (STENTS) ×3 IMPLANT
TUBING CONNECTING 10 (TUBING) ×2 IMPLANT
TUBING CONNECTING 10' (TUBING) ×1

## 2014-01-28 NOTE — H&P (Signed)
History of Present Illness   [name] Pratt presents today to establish as a new patient. He has been referred by the [facility] with a new diagnosis of 2 mid ureteral stones on the right. [name] Pratt is currently 72 years of age. In the past he was cared for here by Dr Terance Hart and Dr Leory Plowman. He has had several episodes of nephrolithiasis. He underwent ESWL in 1998 and also 2003. He has also had a cysto and stent insertion for some obstructing fragments. Approximately 3 days ago, he began experiencing some abdominal and back discomfort. He went to [place] where underwent CT imaging. This demonstrated some scattered nonobstructing stones in the left kidney up to 3 mm in size. In the right kidney there was some hydronephrosis. There were two 5 mm stones adjacent to each other in the mid ureter. Moderate hydro was noted. There were also some nonobstructing stones seen in the right kidney up to 8 mm in size. His symptoms have improved, but he is still having at least some mild discomfort. Urinalysis today is unremarkable.    Review of Systems Genitourinary, constitutional, skin, eye, otolaryngeal, hematologic/lymphatic, cardiovascular, pulmonary, endocrine, musculoskeletal, gastrointestinal, neurological and psychiatric system(s) were reviewed and pertinent findings if present are noted.  Genitourinary: dysuria, incontinence, post-void dribbling and erectile dysfunction.  Gastrointestinal: nausea, flank pain, abdominal pain and constipation.  Constitutional: feeling tired (fatigue).  Integumentary: pruritus.  Eyes: blurred vision.  ENT: sinus problems.  Hematologic/Lymphatic: a tendency to easily bruise.  Cardiovascular: chest pain and leg swelling.  Musculoskeletal: back pain.  Neurological: dizziness.    Vitals Vital Signs [Data Includes: Last 1 Day]  Recorded: [id number] 03:35PM  Height: 5 ft 7 in Weight: 180 lb  BMI Calculated: 28.19 BSA Calculated: 1.93 Blood Pressure: 153 /  75 Temperature: 97.1 F Heart Rate: 63  Physical Exam Constitutional: Well nourished and well developed . No acute distress.  ENT:. The ears and nose are normal in appearance.  Neck: The appearance of the neck is normal and no neck mass is present.  Pulmonary: No respiratory distress and normal respiratory rhythm and effort.  Cardiovascular: Heart rate and rhythm are normal . No peripheral edema.  Abdomen: The abdomen is mildly obese. The abdomen is soft and nontender. No masses are palpated. No CVA tenderness.  An umbilical hernia is present, which is reducible. No hepatosplenomegaly noted.  Rectal: Rectal exam demonstrates normal sphincter tone, no tenderness and no masses. Estimated prostate size is 2+. The prostate has no nodularity and is not tender. The left seminal vesicle is nonpalpable. The right seminal vesicle is nonpalpable. The perineum is normal on inspection.  Genitourinary: Examination of the penis demonstrates no discharge, no masses, no lesions and a normal meatus. The scrotum is without lesions. The right epididymis is palpably normal and non-tender. The left epididymis is palpably normal and non-tender. The right testis is non-tender and without masses. The left testis is non-tender and without masses.  Lymphatics: The femoral and inguinal nodes are not enlarged or tender.  Skin: Normal skin turgor, no visible rash and no visible skin lesions.  Neuro/Psych:. Mood and affect are appropriate.    Results/Data Urine [Data Includes: Last 1 Day]   [id number]  COLOR YELLOW   APPEARANCE CLEAR   SPECIFIC GRAVITY 1.025   pH 5.5   GLUCOSE NEG mg/dL  BILIRUBIN NEG   KETONE NEG mg/dL  BLOOD NEG   PROTEIN NEG mg/dL  UROBILINOGEN 0.2 mg/dL  NITRITE NEG   LEUKOCYTE ESTERASE NEG  Assessment Assessed  1. Nephrolithiasis (592.0) 2. Ureteral calculus (592.1) 3. Hydronephrosis (591)  Plan  Ureteral calculus  1. Renew: Tamsulosin HCl - 0.4 MG Oral Capsule; TAKE 1 CAPSULE EVERY  DAY 2. Follow-up Schedule Surgery Office  Follow-up  Status: Complete  Done: [id number]  Discussion/Summary   [name] Pratt has multiple right ureteral calculi. He also has an 8 mm stone in the lower pole of his right kidney. Clinically he is doing okay at this time and there is no indication for any urgent intervention. On KUB imaging today, I do not see any obvious calcifications over the course of the right mid ureter. There is an 8 mm calcification in the lower pole of the right kidney. In the right hemipelvis, there are 2 adjacent calcifications that are not there on scout imaging from his CT scan. This appears to be the 2 ureteral stones and they are now in the distal right ureter, probably several centimeters above the ureterovesical junction. Those stones are 4-5 mm in size and, again, fairly adjacent. He is told he has about a 50/50 chance of passing. Given the multiple stones, I would favor ureteroscopy with Holmium laser and basketing of fragments in this situation. We would have a higher success rate. Double-J stent would be required about 50% of the time. He does have a reasonable chance of passing these. After discussing the pros and cons, he has elected to be put on the surgical schedule for sometime next week and we would be delighted to cancel if he passes the stones. He will continue to strain his urine and we will renew the alpha-blocker for him.

## 2014-01-28 NOTE — Anesthesia Postprocedure Evaluation (Signed)
  Anesthesia Post-op Note  Patient: [name] Pratt  Procedure(s) Performed: Procedure(s) (LRB): CYSTOSCOPY WITH URETEROSCOPY, BASKET RETRIVAL AND  STENT PLACEMENT (Right) HOLMIUM LASER APPLICATION (Right)  Patient Location: PACU  Anesthesia Type: General  Level of Consciousness: awake and alert   Airway and Oxygen Therapy: Patient Spontanous Breathing  Post-op Pain: mild  Post-op Assessment: Post-op Vital signs reviewed, Patient's Cardiovascular Status Stable, Respiratory Function Stable, Patent Airway and No signs of Nausea or vomiting  Last Vitals:  Filed Vitals:   01/28/14 1000  BP: 136/57  Pulse: 59  Temp:   Resp: 12    Post-op Vital Signs: stable   Complications: No apparent anesthesia complications

## 2014-01-28 NOTE — Transfer of Care (Signed)
Immediate Anesthesia Transfer of Care Note  Patient: [name] Pratt  Procedure(s) Performed: Procedure(s): CYSTOSCOPY WITH URETEROSCOPY, BASKET RETRIVAL AND  STENT PLACEMENT (Right) HOLMIUM LASER APPLICATION (Right)  Patient Location: PACU  Anesthesia Type:General  Level of Consciousness: awake, alert  and patient cooperative  Airway & Oxygen Therapy: Patient Spontanous Breathing and Patient connected to face mask oxygen  Post-op Assessment: Report given to PACU RN and Post -op Vital signs reviewed and stable  Post vital signs: Reviewed and stable  Complications: No apparent anesthesia complications

## 2014-01-28 NOTE — Op Note (Signed)
Preoperative diagnosis: Multiple right distal ureteral calculi Postoperative diagnosis: Same  Procedure: Cystoscopy, right retrograde pyelography, ureteroscopy, holmium laser lithotripsy with basketing of fragments and placement of double-J stent   Surgeon: [name] M.D.  Anesthesia: Gen.  Indications: Patient has a history of nephrolithiasis he recently presented to local emergency room with right renal colic/flank pain. He was diagnosed with 25 mm right midureteral stones. On followup imaging in our office there were 2 adjacent stones now in the distal ureter. The patient requested surgical intervention. Full informed consent obtained. Patient presents now for definitive management.     Technique and findings: Patient was brought the operating room where he had successful induction of general anesthesia. Placed in lithotomy position prepped and draped in usual manner. Appropriate surgical timeout was performed. Cystoscopy revealed unremarkable anterior urethra. The patient had moderate trilobar hyperplasia. The bladder was endoscopically unremarkable.  Retrograde pyelogram was done with an open-ended catheter and fluoroscopic interpretation. 25 mm filling defects were noted in the distal ureter adjacent. Do not did not appear to be high-grade obstruction.   Guidewire was placed the right renal pelvis. The distal right ureter was engaged with a 6.4 [place] rigid ureteroscope. Stones were encountered. Holmium laser lithotripter was used to break the stone into several fragments. The laser was used at 0.8 J and 8 Hz Pieces were then basket extracted. A 6 French 24 cm double-J stent was then placed with fluoroscopic as well as visual guidance. A dangle string was left attached to the stent and that was secured to the patient's penis. Lidocaine jelly was used. The patient no endoscopic issues or problems was brought to recovery in stable condition.

## 2014-01-28 NOTE — Discharge Instructions (Signed)
DISCHARGE INSTRUCTIONS FOR KIDNEY STONES OR URETERAL STENT  MEDICATIONS:   1. DO NOT RESUME YOUR ASPIRIN, or any other medicines like ibuprofen, motrin, excedrin, advil, aleve, vitamin E, fish oil as these can all cause bleeding x 7 days.  2. Resume all your other meds from home - except do not take any other pain meds that you may have at home.  ACTIVITY 1. No strenuous activity x 1week 2. No driving while on narcotic pain medications 3. Drink plenty of water 4. Continue to walk at home - you can still get blood clots when you are at home, so keep active, but don't over do it. 5. May return to work in 3 days.  BATHING 1. You can shower and we recommend daily showers  2. If you have a string coming from your urethra:  The stent string is attached to your ureteral stent.  Do not pull on this.   SIGNS/SYMPTOMS TO CALL: 1. Please call us if you have a fever greater than 101.5, uncontrolled  nausea/vomiting, uncontrolled pain, dizziness, unable to urinate, bloody urine, chest pain, shortness of breath, leg swelling, leg pain, redness around wound, drainage from wound, or any other concerns or questions.  You can reach [place] at [phone].  FOLLOW-UP 1. You have an appointment 5-26 2. If you have a string attached to your stent, you may remove it on 5-24.  To do this, pull the string until the stent is completely removed.  You may feel an odd sensation in your back. 3.  4.

## 2014-01-28 NOTE — Anesthesia Preprocedure Evaluation (Addendum)
Anesthesia Evaluation  Patient identified by MRN, date of birth, ID band Patient awake    Reviewed: Allergy & Precautions, H&P , NPO status , Patient's Chart, lab work & pertinent test results, reviewed documented beta blocker date and time   History of Anesthesia Complications (+) PONV  Airway Mallampati: III TM Distance: >3 FB Neck ROM: full    Dental no notable dental hx. (+) Teeth Intact, Dental Advisory Given   Pulmonary shortness of breath and with exertion, sleep apnea and Continuous Positive Airway Pressure Ventilation , COPDformer smoker,  breath sounds clear to auscultation  Pulmonary exam normal       Cardiovascular hypertension, Pt. on medications Rhythm:regular Rate:Normal     Neuro/Psych Carotid stenosis negative psych ROS   GI/Hepatic negative GI ROS, Neg liver ROS, GERD-  Medicated and Controlled,pancreatitis   Endo/Other  negative endocrine ROSHypothyroidism   Renal/GU negative Renal ROS  negative genitourinary   Musculoskeletal   Abdominal   Peds  Hematology negative hematology ROS (+)   Anesthesia Other Findings   Reproductive/Obstetrics negative OB ROS                          Anesthesia Physical Anesthesia Plan  ASA: III  Anesthesia Plan: General   Post-op Pain Management:    Induction: Intravenous  Airway Management Planned: LMA  Additional Equipment:   Intra-op Plan:   Post-operative Plan:   Informed Consent: I have reviewed the patients History and Physical, chart, labs and discussed the procedure including the risks, benefits and alternatives for the proposed anesthesia with the patient or authorized representative who has indicated his/her understanding and acceptance.   Dental Advisory Given  Plan Discussed with: CRNA and Surgeon  Anesthesia Plan Comments:         Anesthesia Quick Evaluation

## 2014-01-28 NOTE — Progress Notes (Signed)
Pt is noted to have string taped to end of penis.

## 2014-01-29 ENCOUNTER — Encounter (HOSPITAL_COMMUNITY): Payer: Self-pay | Admitting: Urology

## 2014-02-05 ENCOUNTER — Other Ambulatory Visit (HOSPITAL_COMMUNITY): Payer: Self-pay | Admitting: Cardiology

## 2014-02-05 ENCOUNTER — Encounter (HOSPITAL_COMMUNITY): Payer: Medicare Other

## 2014-02-05 DIAGNOSIS — I6529 Occlusion and stenosis of unspecified carotid artery: Secondary | ICD-10-CM

## 2014-02-06 ENCOUNTER — Other Ambulatory Visit: Payer: Self-pay | Admitting: Family

## 2014-02-11 ENCOUNTER — Ambulatory Visit (HOSPITAL_COMMUNITY): Payer: Medicare Other | Attending: Cardiology | Admitting: Cardiology

## 2014-02-11 DIAGNOSIS — I6529 Occlusion and stenosis of unspecified carotid artery: Secondary | ICD-10-CM | POA: Insufficient documentation

## 2014-02-11 NOTE — Progress Notes (Signed)
Carotid duplex complete 

## 2014-02-12 ENCOUNTER — Encounter: Payer: Self-pay | Admitting: Cardiovascular Disease

## 2014-02-12 ENCOUNTER — Ambulatory Visit (INDEPENDENT_AMBULATORY_CARE_PROVIDER_SITE_OTHER): Payer: Medicare Other | Admitting: Cardiovascular Disease

## 2014-02-12 VITALS — BP 139/73 | HR 60 | Ht 67.0 in | Wt 180.2 lb

## 2014-02-12 DIAGNOSIS — E7800 Pure hypercholesterolemia, unspecified: Secondary | ICD-10-CM

## 2014-02-12 DIAGNOSIS — I10 Essential (primary) hypertension: Secondary | ICD-10-CM

## 2014-02-12 DIAGNOSIS — I6529 Occlusion and stenosis of unspecified carotid artery: Secondary | ICD-10-CM

## 2014-02-12 NOTE — Progress Notes (Signed)
Patient ID: [name] Pratt, male   DOB: [birth year], 73 y.o.   MRN: [id number] [name] Pratt returns today for F/U of SSCP, HTN, and increased lipids. He has no documented CAD with a normal myovue 3.14.2008. EF 62%. Echo 03/30/2008 done for dyspnea showed EF 60% with AV sclerosis and mild LAE. 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 He has been compliant with his BP meds and only takes gemfibrizol for triglycerides. His labs are followed at [facility] . He has significant arthritis and sees Dr. Delrae Sawyers for hip and back issues. These are relieved by Aleve. Has some exertional dsypnea but no PND orthopnea or edema. No cough sputum or wheezing.   Since last seen had hernia repair still with ventral hernia.    12.15.14  LDL 54    Wife had MI last week Rx at [facility] and doing ok     ROS: Denies fever, malais, weight loss, blurry vision, decreased visual acuity, cough, sputum, SOB, hemoptysis, pleuritic pain, palpitaitons, heartburn, abdominal pain, melena, lower extremity edema, claudication, or rash.  All other systems reviewed and negative  General: Affect appropriate Healthy:  appears stated age 33: normal Neck supple with no adenopathy JVP normal right  bruits no thyromegaly Lungs clear with no wheezing and good diaphragmatic motion Heart:  S1/S2 no murmur, no rub, gallop or click PMI normal Abdomen: benighn, BS positve, no tenderness, no AAA no bruit.  No HSM or HJR Distal pulses intact with no bruits No edema Neuro non-focal Skin warm and dry No muscular weakness   Current Outpatient Prescriptions  Medication Sig Dispense Refill  . acetaminophen (TYLENOL) 500 MG tablet Take 500 mg by mouth every 6 (six) hours as needed for moderate pain.      [name] albuterol (PROVENTIL HFA;VENTOLIN HFA) 108 (90 BASE) MCG/ACT inhaler Inhale 2 puffs into the lungs every 6 (six) hours as needed for shortness of breath.      . allopurinol (ZYLOPRIM) 100 MG tablet Take 100 mg by mouth daily. Pt takes a total of 400 mg       . allopurinol (ZYLOPRIM) 300 MG tablet Take 300 mg by mouth daily. Pt takes a total of 400mg       . amLODipine (NORVASC) 10 MG tablet Take 10 mg by mouth every evening.      [name] aspirin EC 81 MG tablet Take 81 mg by mouth every morning.      . Carboxymethylcellul-Glycerin (OPTIVE) 0.5-0.9 % SOLN Apply 1 drop to eye daily.      . cycloSPORINE (RESTASIS) 0.05 % ophthalmic emulsion Place 1 drop into both eyes 2 (two) times daily.      . diclofenac sodium (VOLTAREN) 1 % GEL Apply 1 application topically daily as needed (arthritis).      [name] diphenhydrAMINE (BENADRYL) 25 MG tablet Take 25 mg by mouth every 6 (six) hours as needed for allergies.      [name] FIBER FORMULA CAPS Take 1 capsule by mouth daily.       [name] gemfibrozil (LOPID) 600 MG tablet Take 0.5 tablets (300 mg total) by mouth 2 (two) times daily before a meal.  180 tablet  1  . glucose blood test strip Use as instructed to test blood sugar 1 - 2 times daily.  100 each  12  . hydrALAZINE (APRESOLINE) 25 MG tablet Take 1 tablet (25 mg total) by mouth 3 (three) times daily.  90 tablet  5  . HYDROcodone-acetaminophen (NORCO/VICODIN) 5-325 MG per tablet Take 1 tablet by mouth every 6 (  six) hours as needed for pain.  30 tablet  0  . levothyroxine (SYNTHROID, LEVOTHROID) 150 MCG tablet Take 150 mcg by mouth daily before breakfast.      . losartan (COZAAR) 100 MG tablet TAKE ONE TABLET BY MOUTH ONCE DAILY  30 tablet  3  . metoprolol succinate (TOPROL-XL) 25 MG 24 hr tablet TAKE ONE TABLET BY MOUTH ONCE DAILY  90 tablet  1  . metroNIDAZOLE (METROCREAM) 0.75 % cream Apply 1 application topically daily as needed (rosacia).      . multivitamin (THERAGRAN) per tablet Take 1 tablet by mouth daily.        . Omega-3 Fatty Acids (FISH OIL PO) Take 1 capsule by mouth 2 (two) times daily.      [name] omeprazole (PRILOSEC) 20 MG capsule Take 1 capsule (20 mg total) by mouth every morning.  90 capsule  1  . pravastatin (PRAVACHOL) 40 MG tablet Take 40 mg by mouth every  evening.      . sodium chloride (OCEAN) 0.65 % nasal spray Place 1 spray into both nostrils at bedtime as needed for congestion.       No current facility-administered medications for this visit.    Allergies  Metoclopramide hcl  Electrocardiogram:  5/15  SR LAD normal orthewise   Assessment and Plan

## 2014-02-12 NOTE — Assessment & Plan Note (Signed)
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  F/U duplex in 1 year.  ASA

## 2014-02-12 NOTE — Patient Instructions (Signed)
Your physician wants you to follow-up in:  [place] will receive a reminder letter in the mail two months in advance. If you don't receive a letter, please call our office to schedule the follow-up appointment. Your physician recommends that you continue on your current medications as directed. Please refer to the Current Medication list given to you today. Your physician has requested that you have a carotid duplex. This test is an ultrasound of the carotid arteries in your neck. It looks at blood flow through these arteries that supply the brain with blood. Allow one hour for this exam. There are no restrictions or special instructions. DUE IN   1 YEAR

## 2014-02-12 NOTE — Assessment & Plan Note (Signed)
Cholesterol is at goal.  Continue current dose of statin and diet Rx.  No myalgias or side effects.  F/U  LFT's in 6 months. Lab Results  Component Value Date   LDLCALC 84 08/25/2013

## 2014-02-12 NOTE — Assessment & Plan Note (Signed)
Well controlled.  Continue current medications and low sodium Dash type diet.    

## 2014-02-12 NOTE — Assessment & Plan Note (Signed)
>>  ASSESSMENT AND PLAN FOR BENIGN ESSENTIAL HYPERTENSION WRITTEN ON 02/12/2014  9:57 AM BY Wendall Stade, MD  Well controlled.  Continue current medications and low sodium Dash type diet.

## 2014-03-03 ENCOUNTER — Encounter: Payer: Self-pay | Admitting: Family

## 2014-03-03 ENCOUNTER — Ambulatory Visit (INDEPENDENT_AMBULATORY_CARE_PROVIDER_SITE_OTHER): Payer: Medicare Other | Admitting: Family

## 2014-03-03 VITALS — BP 110/80 | HR 59 | Temp 97.8°F | Ht 67.0 in | Wt 177.0 lb

## 2014-03-03 DIAGNOSIS — R739 Hyperglycemia, unspecified: Secondary | ICD-10-CM

## 2014-03-03 DIAGNOSIS — M109 Gout, unspecified: Secondary | ICD-10-CM

## 2014-03-03 DIAGNOSIS — R7309 Other abnormal glucose: Secondary | ICD-10-CM

## 2014-03-03 DIAGNOSIS — E785 Hyperlipidemia, unspecified: Secondary | ICD-10-CM

## 2014-03-03 DIAGNOSIS — I10 Essential (primary) hypertension: Secondary | ICD-10-CM

## 2014-03-03 DIAGNOSIS — E039 Hypothyroidism, unspecified: Secondary | ICD-10-CM

## 2014-03-03 HISTORY — DX: Hyperglycemia, unspecified: R73.9

## 2014-03-03 LAB — HEPATIC FUNCTION PANEL
ALT: 19 U/L (ref 0–53)
AST: 21 U/L (ref 0–37)
Albumin: 4.6 g/dL (ref 3.5–5.2)
Alkaline Phosphatase: 82 U/L (ref 39–117)
BILIRUBIN DIRECT: 0.1 mg/dL (ref 0.0–0.3)
BILIRUBIN INDIRECT: 0.5 mg/dL (ref 0.2–1.2)
TOTAL PROTEIN: 7.6 g/dL (ref 6.0–8.3)
Total Bilirubin: 0.6 mg/dL (ref 0.2–1.2)

## 2014-03-03 LAB — HEMOGLOBIN A1C
HEMOGLOBIN A1C: 6.1 % — AB (ref <5.7)
MEAN PLASMA GLUCOSE: 128 mg/dL — AB (ref <117)

## 2014-03-03 LAB — TSH: TSH: 0.976 u[IU]/mL (ref 0.350–4.500)

## 2014-03-03 NOTE — Assessment & Plan Note (Signed)
Stable on statin, check follow up lft.

## 2014-03-03 NOTE — Assessment & Plan Note (Signed)
Clinically stable. Obtain tsh, continue synthroid.

## 2014-03-03 NOTE — Patient Instructions (Signed)
Please complete lab work prior to leaving. Follow up in 3 months.  

## 2014-03-03 NOTE — Progress Notes (Signed)
Subjective:    Patient ID: [name] Pratt, male    DOB: 08/31/1941, 73 y.o.   MRN: [id number]  HPI  Mr. Fridman is a 73 yr old male who presents today for follow up.  1) HTN- reports some sob with walking up and down steps only. Denies CP. He is maintained on losartan, amlodipine, hydralazine and metoprolol.  BP Readings from Last 3 Encounters:  03/03/14 110/80  02/12/14 139/73  01/28/14 148/76   2) Hyperlipidemia- on gemfibrozil and pravastatin.   Lab Results  Component Value Date   CHOL 146 08/25/2013   HDL 35* 08/25/2013   LDLCALC 84 08/25/2013   TRIG 137 08/25/2013   CHOLHDL 4.2 08/25/2013    3) Hypothyroid- tolerating synthroid.    4) gout- denies recent gout flare.    5) OSA- He uses CPAP. Reports that he wakes up feeling rested. Though he can doze off if he has not gotten enough sleep. He has been on CPAP since 2007.    6) hyperglycemia- notes some foot numbness/pain on occasion.    Review of Systems See HPI  Past Medical History  Diagnosis Date  . Hyperlipidemia     under control  . Chest pain      Myoview in 2008 was normal.  Echo 7/09: EF 60%, normal wall motion, mild LVH, mild LAE.  . Inguinal hernia   . Pancreatitis   . Rosacea   . Gout   . Arthritis   . History of chicken pox   . GERD (gastroesophageal reflux disease)   . History of hemorrhoids   . OSA (obstructive sleep apnea)   . Carotid stenosis     a. Carotid U/S 9/73: LICA < 53%, RICA 29-92%;  b.  Carotid U/S 4/26:  RICA 83-41%; LICA 9-62%; f/u 1 year  . Hypertension     under control  . COPD (chronic obstructive pulmonary disease)   . Hypothyroidism   . History of kidney stones   . Cancer     skin cancer  . Complication of anesthesia     "stomach does not wake up"  . Ileus   . PONV (postoperative nausea and vomiting)     History   Social History  . Marital Status: Married    Spouse Name: N/A    Number of Children: 1  . Years of Education: N/A   Occupational History  .  firefighter     paints on the side   Social History Main Topics  . Smoking status: Former Smoker -- 11 years    Types: Cigarettes    Quit date: 09/11/1978  . Smokeless tobacco: Never Used  . Alcohol Use: Yes     Comment: rare  . Drug Use: No  . Sexual Activity: Not on file   Other Topics Concern  . Not on file   Social History Narrative   Lives with his wife   He has one son- lives locally.   2 grandchildren   Has worked for fire department   Part time pain business   Enjoys wood working   Completed HS.  [employer]    Past Surgical History  Procedure Laterality Date  . Laparoscopic cholecystectomy  09/20/06    with intraoperative cholangiogram and right inguinal herniorrhaphy with mesh   . Hiatal hernia repair  12/21/93  . Knee surgery  1995  . Hemorrhoid surgery  2014  . Lithotripsy      x2  . Abdominal hernia repair  07/15/12  Dr [name]  . Nasal septum surgery  1975  . Esophageal dilation  1993 and 1994    multiple times  . Brow lift  05/07/01  . Urethral dilation  1991    [name]  . Epidural injections      multiple procedures  . 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 Ligament repair Left 07/2013    shoulder  . Skin cancer destruction      nose, ear  . Inguinal hernia repair Right 07/15/12    Dr [name], x2  . Joint replacement  07/25/04    right knee  . Joint replacement  07/13/10    left knee  . Total hip arthroplasty Left   . Eye surgery Bilateral 03/25/10, 2012    cataract removal  . Refractive surgery Bilateral   . Tonsillectomy  as child  . Cystoscopy with ureteroscopy and stent placement Right 01/28/2014    Procedure: CYSTOSCOPY WITH URETEROSCOPY, BASKET RETRIVAL AND  STENT PLACEMENT;  Surgeon: [name], MD;  Location: [facility];  Service: Urology;  Laterality: Right;  . Holmium laser application Right 7/84/6962    Procedure: HOLMIUM LASER APPLICATION;  Surgeon: [name], MD;  Location: [facility];  Service: Urology;  Laterality: Right;     Family History  Problem Relation Age of Onset  . Hypertension    . Cancer    . Osteoarthritis      Allergies  Allergen Reactions  . Metoclopramide Hcl     anxiety    Current Outpatient Prescriptions on File Prior to Visit  Medication Sig Dispense Refill  . acetaminophen (TYLENOL) 500 MG tablet Take 500 mg by mouth every 6 (six) hours as needed for moderate pain.      [name] albuterol (PROVENTIL HFA;VENTOLIN HFA) 108 (90 BASE) MCG/ACT inhaler Inhale 2 puffs into the lungs every 6 (six) hours as needed for shortness of breath.      . allopurinol (ZYLOPRIM) 100 MG tablet Take 100 mg by mouth daily. Pt takes a total of 400 mg      . allopurinol (ZYLOPRIM) 300 MG tablet Take 300 mg by mouth daily. Pt takes a total of 400mg       . amLODipine (NORVASC) 10 MG tablet Take 10 mg by mouth every evening.      [name] aspirin EC 81 MG tablet Take 81 mg by mouth every morning.      . Carboxymethylcellul-Glycerin (OPTIVE) 0.5-0.9 % SOLN Apply 1 drop to eye daily.      . cycloSPORINE (RESTASIS) 0.05 % ophthalmic emulsion Place 1 drop into both eyes 2 (two) times daily.      . diclofenac sodium (VOLTAREN) 1 % GEL Apply 1 application topically daily as needed (arthritis).      [name] diphenhydrAMINE (BENADRYL) 25 MG tablet Take 25 mg by mouth every 6 (six) hours as needed for allergies.      [name] FIBER FORMULA CAPS Take 1 capsule by mouth daily.       [name] gemfibrozil (LOPID) 600 MG tablet Take 0.5 tablets (300 mg total) by mouth 2 (two) times daily before a meal.  180 tablet  1  . glucose blood test strip Use as instructed to test blood sugar 1 - 2 times daily.  100 each  12  . hydrALAZINE (APRESOLINE) 25 MG tablet Take 1 tablet (25 mg total) by mouth 3 (three) times daily.  90 tablet  5  . HYDROcodone-acetaminophen (NORCO/VICODIN) 5-325 MG per tablet Take 1 tablet by mouth every  6 (six) hours as needed for pain.  30 tablet  0  . levothyroxine (SYNTHROID, LEVOTHROID) 150 MCG tablet Take 150 mcg by mouth daily before  breakfast.      . losartan (COZAAR) 100 MG tablet TAKE ONE TABLET BY MOUTH ONCE DAILY  30 tablet  3  . metoprolol succinate (TOPROL-XL) 25 MG 24 hr tablet TAKE ONE TABLET BY MOUTH ONCE DAILY  90 tablet  1  . metroNIDAZOLE (METROCREAM) 0.75 % cream Apply 1 application topically daily as needed (rosacia).      . multivitamin (THERAGRAN) per tablet Take 1 tablet by mouth daily.        . Omega-3 Fatty Acids (FISH OIL PO) Take 1 capsule by mouth 2 (two) times daily.      [name] omeprazole (PRILOSEC) 20 MG capsule Take 1 capsule (20 mg total) by mouth every morning.  90 capsule  1  . pravastatin (PRAVACHOL) 40 MG tablet Take 40 mg by mouth every evening.      . sodium chloride (OCEAN) 0.65 % nasal spray Place 1 spray into both nostrils at bedtime as needed for congestion.       No current facility-administered medications on file prior to visit.    BP 110/80  Pulse 59  Temp(Src) 97.8 F (36.6 C) (Oral)  Ht 5\' 7"  (1.702 m)  Wt 177 lb (80.287 kg)  BMI 27.72 kg/m2  SpO2 96%       Objective:   Physical Exam  Constitutional: He is oriented to person, place, and time. He appears well-developed and well-nourished. No distress.  HENT:  Head: Normocephalic and atraumatic.  Cardiovascular: Normal rate and regular rhythm.   No murmur heard. Pulmonary/Chest: Effort normal and breath sounds normal. No respiratory distress. He has no wheezes. He has no rales. He exhibits no tenderness.  Musculoskeletal: He exhibits no edema.  Neurological: He is alert and oriented to person, place, and time.  Psychiatric: He has a normal mood and affect. His behavior is normal. Judgment and thought content normal.          Assessment & Plan:

## 2014-03-03 NOTE — Assessment & Plan Note (Signed)
>>  ASSESSMENT AND PLAN FOR BENIGN ESSENTIAL HYPERTENSION WRITTEN ON 03/03/2014 10:12 AM BY O'SULLIVAN, Sharna Gabrys, NP  Stable on current meds.  Continue same.

## 2014-03-03 NOTE — Assessment & Plan Note (Signed)
Stable on current meds.  Continue same. 

## 2014-03-03 NOTE — Assessment & Plan Note (Signed)
Obtain A1C. 

## 2014-03-03 NOTE — Progress Notes (Signed)
Pre visit review using our clinic review tool, if applicable. No additional management support is needed unless otherwise documented below in the visit note. 

## 2014-03-03 NOTE — Assessment & Plan Note (Signed)
Stable on allopurinol.  Continue same.  

## 2014-03-04 ENCOUNTER — Encounter: Payer: Self-pay | Admitting: Family

## 2014-03-25 ENCOUNTER — Other Ambulatory Visit: Payer: Self-pay | Admitting: Family

## 2014-03-25 NOTE — Telephone Encounter (Signed)
Medication Detail      Disp Refills Start End     hydrALAZINE (APRESOLINE) 25 MG tablet 90 tablet 5 08/25/2013     Sig - Route: Take 1 tablet (25 mg total) by mouth 3 (three) times daily. - Oral    E-Prescribing Status: Receipt confirmed by pharmacy (08/25/2013 9:39 AM EST)    Pharmacy    [facility]   Rx request to pharmacy/SLS

## 2014-03-31 ENCOUNTER — Other Ambulatory Visit: Payer: Self-pay | Admitting: Family

## 2014-04-09 ENCOUNTER — Other Ambulatory Visit: Payer: Self-pay | Admitting: Family

## 2014-04-09 NOTE — Telephone Encounter (Signed)
Rx request to pharmacy/SLS  

## 2014-04-21 ENCOUNTER — Other Ambulatory Visit: Payer: Self-pay | Admitting: Family Medicine

## 2014-04-30 ENCOUNTER — Other Ambulatory Visit: Payer: Self-pay | Admitting: Family

## 2014-04-30 NOTE — Telephone Encounter (Signed)
Informed patient of medication refill and he scheduled appointment for 06/04/14

## 2014-04-30 NOTE — Telephone Encounter (Signed)
Rx sent to pharmacy. Pt is due for f/u in September. Please call to schedule. Thank you. LDM

## 2014-05-18 ENCOUNTER — Other Ambulatory Visit: Payer: Self-pay | Admitting: Family Medicine

## 2014-05-18 ENCOUNTER — Other Ambulatory Visit: Payer: Self-pay | Admitting: Family

## 2014-05-25 ENCOUNTER — Other Ambulatory Visit: Payer: Self-pay | Admitting: Family

## 2014-06-03 ENCOUNTER — Encounter: Payer: Self-pay | Admitting: Family

## 2014-06-03 ENCOUNTER — Ambulatory Visit (INDEPENDENT_AMBULATORY_CARE_PROVIDER_SITE_OTHER): Payer: Medicare Other | Admitting: Family

## 2014-06-03 VITALS — BP 172/90 | HR 51 | Temp 97.8°F | Resp 16 | Ht 67.0 in | Wt 175.0 lb

## 2014-06-03 DIAGNOSIS — E785 Hyperlipidemia, unspecified: Secondary | ICD-10-CM

## 2014-06-03 DIAGNOSIS — G4730 Sleep apnea, unspecified: Secondary | ICD-10-CM

## 2014-06-03 DIAGNOSIS — I10 Essential (primary) hypertension: Secondary | ICD-10-CM

## 2014-06-03 DIAGNOSIS — D72829 Elevated white blood cell count, unspecified: Secondary | ICD-10-CM

## 2014-06-03 DIAGNOSIS — E039 Hypothyroidism, unspecified: Secondary | ICD-10-CM

## 2014-06-03 DIAGNOSIS — Z23 Encounter for immunization: Secondary | ICD-10-CM

## 2014-06-03 DIAGNOSIS — R739 Hyperglycemia, unspecified: Secondary | ICD-10-CM

## 2014-06-03 DIAGNOSIS — R7309 Other abnormal glucose: Secondary | ICD-10-CM

## 2014-06-03 DIAGNOSIS — G4733 Obstructive sleep apnea (adult) (pediatric): Secondary | ICD-10-CM

## 2014-06-03 LAB — BASIC METABOLIC PANEL
BUN: 20 mg/dL (ref 6–23)
CHLORIDE: 103 meq/L (ref 96–112)
CO2: 30 mEq/L (ref 19–32)
Calcium: 9.7 mg/dL (ref 8.4–10.5)
Creatinine, Ser: 1.1 mg/dL (ref 0.4–1.5)
GFR: 68.06 mL/min (ref >60.00)
Glucose, Bld: 112 mg/dL — ABNORMAL HIGH (ref 70–99)
POTASSIUM: 4.9 meq/L (ref 3.5–5.1)
SODIUM: 138 meq/L (ref 135–145)

## 2014-06-03 LAB — CBC WITH DIFFERENTIAL/PLATELET
Basophils Absolute: 0 10*3/uL (ref 0.0–0.1)
Basophils Relative: 0.6 % (ref 0.0–3.0)
Eosinophils Absolute: 0.2 10*3/uL (ref 0.0–0.7)
Eosinophils Relative: 3.2 % (ref 0.0–5.0)
HEMATOCRIT: 42.9 % (ref 39.0–52.0)
HEMOGLOBIN: 14.3 g/dL (ref 13.0–17.0)
LYMPHS ABS: 2 10*3/uL (ref 0.7–4.0)
Lymphocytes Relative: 34.4 % (ref 12.0–46.0)
MCHC: 33.3 g/dL (ref 30.0–36.0)
MCV: 97.6 fl (ref 78.0–100.0)
MONO ABS: 0.7 10*3/uL (ref 0.1–1.0)
MONOS PCT: 11.8 % (ref 3.0–12.0)
Neutro Abs: 2.9 10*3/uL (ref 1.4–7.7)
Neutrophils Relative %: 50 % (ref 43.0–77.0)
Platelets: 386 10*3/uL (ref 150.0–400.0)
RBC: 4.39 Mil/uL (ref 4.22–5.81)
RDW: 14.1 % (ref 11.5–15.5)
WBC: 5.9 10*3/uL (ref 4.0–10.5)

## 2014-06-03 LAB — HEMOGLOBIN A1C: Hgb A1c MFr Bld: 6 % (ref 4.6–6.5)

## 2014-06-03 MED ORDER — HYDRALAZINE HCL 50 MG PO TABS: 50.0000 mg | ORAL_TABLET | Freq: Three times a day (TID) | ORAL | Status: DC

## 2014-06-03 MED ORDER — OMEPRAZOLE 20 MG PO CPDR: 20.0000 mg | DELAYED_RELEASE_CAPSULE | Freq: Every morning | ORAL | Status: DC

## 2014-06-03 MED ORDER — HYDRALAZINE HCL 25 MG PO TABS: ORAL_TABLET | ORAL | Status: DC

## 2014-06-03 MED ORDER — LEVOTHYROXINE SODIUM 150 MCG PO TABS: ORAL_TABLET | ORAL | Status: DC

## 2014-06-03 MED ORDER — ALLOPURINOL 300 MG PO TABS: 300.0000 mg | ORAL_TABLET | Freq: Every day | ORAL | Status: DC

## 2014-06-03 NOTE — Patient Instructions (Addendum)
Please complete lab work prior to leaving.  Increase hydralazine from 25 mg to 50mg  3 times daily.  Please schedule a follow up appointment in 2 weeks.

## 2014-06-03 NOTE — Progress Notes (Signed)
Pre visit review using our clinic review tool, if applicable. No additional management support is needed unless otherwise documented below in the visit note/SLS  

## 2014-06-03 NOTE — Progress Notes (Signed)
Subjective:    Patient ID: [name] Pratt, male    DOB: March 15, 1941, 73 y.o.   MRN: [id number]  HPI  [name] Pratt is a 73 yr old male who presents today for follow up of multiple medical problems:  1) HTN- maintained on losartan, amlodipine, metoprolol.   Reports + med compliance.  BP Readings from Last 3 Encounters:  06/03/14 159/73  03/03/14 110/80  02/12/14 139/73   2) Hyperlipidemia-  On pravastatin, lopid.  Denies myalgia.   3) Hypothyroid-  Maintained on levothyroxine.  Lab Results  Component Value Date   TSH 0.976 03/03/2014   4) OSA- maintained on CPAP. Some dryness. Notes + humidification of cpap machine.  5) Hyperglycemia-  Lab Results  Component Value Date   HGBA1C 6.1* 03/03/2014   6) Gout- maintained on allopurinol. No recent gout flares.     Review of Systems Past Medical History  Diagnosis Date  . Hyperlipidemia     under control  . Chest pain      Myoview in 2008 was normal.  Echo 7/09: EF 60%, normal wall motion, mild LVH, mild LAE.  . Inguinal hernia   . Pancreatitis   . Rosacea   . Gout   . Arthritis   . History of chicken pox   . GERD (gastroesophageal reflux disease)   . History of hemorrhoids   . OSA (obstructive sleep apnea)   . Carotid stenosis     a. Carotid U/S 3/29: LICA < 92%, RICA 42-68%;  b.  Carotid U/S 3/41:  RICA 96-22%; LICA 2-97%; f/u 1 year  . Hypertension     under control  . COPD (chronic obstructive pulmonary disease)   . Hypothyroidism   . History of kidney stones   . Cancer     skin cancer  . Complication of anesthesia     "stomach does not wake up"  . Ileus   . PONV (postoperative nausea and vomiting)     History   Social History  . Marital Status: Married    Spouse Name: N/A    Number of Children: 1  . 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 Smoking status: Former Smoker -- 11 years    Types: Cigarettes    Quit date:  09/11/1978  . Smokeless tobacco: Never Used  . Alcohol Use: Yes     Comment: rare  . Drug Use: No  . Sexual Activity: Not on file   Other Topics Concern  . Not on file   Social History Narrative   Lives with his wife   He has one son- lives locally.   2 grandchildren   Has worked for fire department   Part time pain business   Enjoys wood working   Completed HS.  [employer]    Past Surgical History  Procedure Laterality Date  . Laparoscopic cholecystectomy  09/20/06    with intraoperative cholangiogram and right inguinal herniorrhaphy with mesh   . Hiatal hernia repair  12/21/93  . Knee surgery  1995  . Hemorrhoid surgery  2014  . Lithotripsy      x2  . Abdominal hernia repair  07/15/12    Dr [name]  . Nasal septum surgery  1975  . Esophageal dilation  1993 and 1994    multiple times  . Brow lift  05/07/01  . Urethral dilation  1991  [name]  . Epidural injections      multiple procedures  . 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 Ligament repair Left 07/2013    shoulder  . Skin cancer destruction      nose, ear  . Inguinal hernia repair Right 07/15/12    Dr [name], x2  . Joint replacement  07/25/04    right knee  . Joint replacement  07/13/10    left knee  . Total hip arthroplasty Left   . Eye surgery Bilateral 03/25/10, 2012    cataract removal  . Refractive surgery Bilateral   . Tonsillectomy  as child  . Cystoscopy with ureteroscopy and stent placement Right 01/28/2014    Procedure: CYSTOSCOPY WITH URETEROSCOPY, BASKET RETRIVAL AND  STENT PLACEMENT;  Surgeon: [name], MD;  Location: [facility];  Service: Urology;  Laterality: Right;  . Holmium laser application Right 9/73/5329    Procedure: HOLMIUM LASER APPLICATION;  Surgeon: [name], MD;  Location: [facility];  Service: Urology;  Laterality: Right;    Family History  Problem Relation Age of Onset  . Hypertension    . Cancer    . Osteoarthritis      Allergies  Allergen Reactions  .  Metoclopramide Hcl     anxiety    Current Outpatient Prescriptions on File Prior to Visit  Medication Sig Dispense Refill  . acetaminophen (TYLENOL) 500 MG tablet Take 500 mg by mouth every 6 (six) hours as needed for moderate pain.      [name] albuterol (PROVENTIL HFA;VENTOLIN HFA) 108 (90 BASE) MCG/ACT inhaler Inhale 2 puffs into the lungs every 6 (six) hours as needed for shortness of breath.      [name] amLODipine (NORVASC) 10 MG tablet TAKE ONE TABLET BY MOUTH ONCE DAILY  90 tablet  0  . aspirin EC 81 MG tablet Take 81 mg by mouth every morning.      . Carboxymethylcellul-Glycerin (OPTIVE) 0.5-0.9 % SOLN Apply 1 drop to eye daily.      . cycloSPORINE (RESTASIS) 0.05 % ophthalmic emulsion Place 1 drop into both eyes 2 (two) times daily.      . diclofenac sodium (VOLTAREN) 1 % GEL Apply 1 application topically daily as needed (arthritis).      [name] diphenhydrAMINE (BENADRYL) 25 MG tablet Take 25 mg by mouth every 6 (six) hours as needed for allergies.      [name] FIBER FORMULA CAPS Take 1 capsule by mouth daily.       [name] gemfibrozil (LOPID) 600 MG tablet TAKE ONE-HALF TABLET BY MOUTH TWICE DAILY BEFORE MEAL(S)  90 tablet  1  . glucose blood test strip Use as instructed to test blood sugar 1 - 2 times daily.  100 each  12  . HYDROcodone-acetaminophen (NORCO/VICODIN) 5-325 MG per tablet Take 1 tablet by mouth every 6 (six) hours as needed for pain.  30 tablet  0  . losartan (COZAAR) 100 MG tablet TAKE ONE TABLET BY MOUTH ONCE DAILY  30 tablet  3  . metoprolol succinate (TOPROL-XL) 25 MG 24 hr tablet TAKE ONE TABLET BY MOUTH ONCE DAILY  90 tablet  1  . metroNIDAZOLE (METROCREAM) 0.75 % cream Apply 1 application topically daily as needed (rosacia).      . multivitamin (THERAGRAN) per tablet Take 1 tablet by mouth daily.        . Omega-3 Fatty Acids (FISH OIL PO) Take 1 capsule by mouth 2 (two) times daily.      [name]  pravastatin (PRAVACHOL) 40 MG tablet TAKE ONE TABLET BY MOUTH ONCE DAILY  90 tablet  1  . sodium  chloride (OCEAN) 0.65 % nasal spray Place 1 spray into both nostrils at bedtime as needed for congestion.       No current facility-administered medications on file prior to visit.    BP 159/73  Pulse 51  Temp(Src) 97.8 F (36.6 C) (Oral)  Resp 16  Ht 5\' 7"  (1.702 m)  Wt 175 lb (79.379 kg)  BMI 27.40 kg/m2  SpO2 98%       Objective:   Physical Exam  Constitutional: He is oriented to person, place, and time. He appears well-developed and well-nourished. No distress.  HENT:  Head: Normocephalic and atraumatic.  Cardiovascular: Normal rate and regular rhythm.   No murmur heard. Pulmonary/Chest: Effort normal and breath sounds normal. No respiratory distress. He has no wheezes. He has no rales. He exhibits no tenderness.  Neurological: He is alert and oriented to person, place, and time.  Psychiatric: He has a normal mood and affect. His behavior is normal. Judgment and thought content normal.          Assessment & Plan:

## 2014-06-04 ENCOUNTER — Encounter: Payer: Self-pay | Admitting: Family

## 2014-06-04 NOTE — Assessment & Plan Note (Signed)
Uncontrolled. Increase hydralazine form 25mg  to 50mg  tid.

## 2014-06-04 NOTE — Assessment & Plan Note (Signed)
Lab Results  Component Value Date   CHOL 146 08/25/2013   HDL 35* 08/25/2013   LDLCALC 84 08/25/2013   TRIG 137 08/25/2013   CHOLHDL 4.2 08/25/2013  Lipids stable, continue current meds.

## 2014-06-04 NOTE — Assessment & Plan Note (Signed)
>>  ASSESSMENT AND PLAN FOR BENIGN ESSENTIAL HYPERTENSION WRITTEN ON 06/04/2014  8:49 PM BY O'SULLIVAN, Thalya Fouche, NP  Uncontrolled. Increase hydralazine form 25mg  to 50mg  tid.

## 2014-06-04 NOTE — Assessment & Plan Note (Signed)
Stable on levothyroxine, continue same.

## 2014-06-04 NOTE — Assessment & Plan Note (Signed)
Lab Results  Component Value Date   HGBA1C 6.0 06/03/2014    A1C remains stable.  Continue diabetic diet/monitor.

## 2014-06-04 NOTE — Assessment & Plan Note (Signed)
Continue CPAP.  

## 2014-06-05 ENCOUNTER — Other Ambulatory Visit: Payer: Self-pay | Admitting: Family

## 2014-06-08 ENCOUNTER — Other Ambulatory Visit: Payer: Self-pay | Admitting: Family

## 2014-06-10 ENCOUNTER — Telehealth: Payer: Self-pay | Admitting: Family

## 2014-06-10 ENCOUNTER — Other Ambulatory Visit: Payer: Self-pay | Admitting: Family

## 2014-06-10 MED ORDER — ALLOPURINOL 100 MG PO TABS: 100.0000 mg | ORAL_TABLET | Freq: Every day | ORAL | Status: DC

## 2014-06-10 NOTE — Telephone Encounter (Signed)
Patient states that the pharmacy told him that they never received medication refill

## 2014-06-10 NOTE — Telephone Encounter (Signed)
Left detailed message on cell# that rx was sent on 06/03/14 and to check with pharmacy. Advised pt to call me back if he had other questions/concerns.

## 2014-06-10 NOTE — Telephone Encounter (Signed)
Pt would like to know why is rx allopurinol (ZYLOPRIM) 100 MG tablet, states Melissa stated she was going to check on the rx for him. Pt states he is completely out and want to know what his options are.

## 2014-06-10 NOTE — Telephone Encounter (Signed)
Spoke with pt. He states his previous doctor [facility]) increased his allopurinol to 400mg . Med does not come in 400mg  so pt was taking 300mg  and 100mg  strength together. Pt is requesting refill of allopurinol 100mg ? States he is completely out now and is requesting rx as soon as possible. IF ok to send refill can you send as I will not be in the office tomorrow?  Please advise.

## 2014-06-12 ENCOUNTER — Telehealth: Payer: Self-pay | Admitting: *Deleted

## 2014-06-12 MED ORDER — ALLOPURINOL 100 MG PO TABS: 100.0000 mg | ORAL_TABLET | Freq: Every day | ORAL | Status: DC

## 2014-06-12 NOTE — Telephone Encounter (Signed)
Received fax from [facility] requesting 90 day supply of allopurinol 100mg . Rx re-sent.

## 2014-06-17 ENCOUNTER — Encounter: Payer: Self-pay | Admitting: Family

## 2014-06-17 ENCOUNTER — Ambulatory Visit (INDEPENDENT_AMBULATORY_CARE_PROVIDER_SITE_OTHER): Payer: Medicare Other | Admitting: Family

## 2014-06-17 VITALS — BP 130/70 | HR 55 | Temp 97.8°F | Resp 16 | Ht 67.0 in | Wt 177.0 lb

## 2014-06-17 DIAGNOSIS — I10 Essential (primary) hypertension: Secondary | ICD-10-CM

## 2014-06-17 NOTE — Patient Instructions (Signed)
Continue current meds. .62months

## 2014-06-17 NOTE — Progress Notes (Signed)
Pre visit review using our clinic review tool, if applicable. No additional management support is needed unless otherwise documented below in the visit note. 

## 2014-06-17 NOTE — Progress Notes (Signed)
Subjective:    Patient ID: [name] Pratt, male    DOB: 1940-10-01, 73 y.o.   MRN: [id number]  HPI  [name] is a 73 year old male who presents today for follow up. 1. Hypertension - Currently on amlodipine, hydralazine, losartan, metoprolol.  Last visit his hydralazine was increased to 50mg  tid.  Today his blood pressure 130//70.  At home blood pressures have been in 140's/70's.  Reports that he has been taking hydralazine tid with only missing a few doses.  He reports that his ankles will swell if he sits longer than 30 minutes.  This resolves when he gets up and walk around.  Denies shortness of breath or chest pain.        Review of Systems  Constitutional: Negative for fever, chills and fatigue.  HENT: Negative.   Respiratory: Negative for cough, chest tightness and shortness of breath.   Cardiovascular: Positive for leg swelling. Negative for chest pain and palpitations.  Musculoskeletal: Negative.   Skin: Negative.   Neurological: Negative for dizziness, syncope, light-headedness and headaches.   Past Medical History  Diagnosis Date  . Hyperlipidemia     under control  . Chest pain      Myoview in 2008 was normal.  Echo 7/09: EF 60%, normal wall motion, mild LVH, mild LAE.  . Inguinal hernia   . Pancreatitis   . Rosacea   . Gout   . Arthritis   . History of chicken pox   . GERD (gastroesophageal reflux disease)   . History of hemorrhoids   . OSA (obstructive sleep apnea)   . Carotid stenosis     a. Carotid U/S 6/72: LICA < 09%, RICA 47-09%;  b.  Carotid U/S 6/28:  RICA 36-62%; LICA 9-47%; f/u 1 year  . Hypertension     under control  . COPD (chronic obstructive pulmonary disease)   . Hypothyroidism   . History of kidney stones   . Cancer     skin cancer  . Complication of anesthesia     "stomach does not wake up"  . Ileus   . PONV (postoperative nausea and vomiting)     History   Social History  . Marital Status: Married    Spouse Name: N/A   Number of Children: 1  . 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 Smoking status: Former Smoker -- 11 years    Types: Cigarettes    Quit date: 09/11/1978  . Smokeless tobacco: Never Used  . Alcohol Use: Yes     Comment: rare  . Drug Use: No  . Sexual Activity: Not on file   Other Topics Concern  . Not on file   Social History Narrative   Lives with his wife   He has one son- lives locally.   2 grandchildren   Has worked for fire department   Part time pain business   Enjoys wood working   Completed HS.  [employer]    Past Surgical History  Procedure Laterality Date  . Laparoscopic cholecystectomy  09/20/06    with intraoperative cholangiogram and right inguinal herniorrhaphy with mesh   . Hiatal hernia repair  12/21/93  . Knee surgery  1995  . Hemorrhoid surgery  2014  . Lithotripsy      x2  . Abdominal hernia repair  07/15/12    Dr [name]  . Nasal  septum surgery  1975  . Esophageal dilation  1993 and 1994    multiple times  . Brow lift  05/07/01  . Urethral dilation  1991    [name]  . Epidural injections      multiple procedures  . 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 Ligament repair Left 07/2013    shoulder  . Skin cancer destruction      nose, ear  . Inguinal hernia repair Right 07/15/12    Dr [name], x2  . Joint replacement  07/25/04    right knee  . Joint replacement  07/13/10    left knee  . Total hip arthroplasty Left   . Eye surgery Bilateral 03/25/10, 2012    cataract removal  . Refractive surgery Bilateral   . Tonsillectomy  as child  . Cystoscopy with ureteroscopy and stent placement Right 01/28/2014    Procedure: CYSTOSCOPY WITH URETEROSCOPY, BASKET RETRIVAL AND  STENT PLACEMENT;  Surgeon: [name], MD;  Location: [facility];  Service: Urology;  Laterality: Right;  . Holmium laser application Right 2/44/0102    Procedure: HOLMIUM LASER APPLICATION;  Surgeon: [name], MD;  Location: [facility];  Service: Urology;  Laterality: Right;    Family History  Problem Relation Age of Onset  . Hypertension    . Cancer    . Osteoarthritis      Allergies  Allergen Reactions  . Metoclopramide Hcl     anxiety    Current Outpatient Prescriptions on File Prior to Visit  Medication Sig Dispense Refill  . acetaminophen (TYLENOL) 500 MG tablet Take 500 mg by mouth every 6 (six) hours as needed for moderate pain.      [name] albuterol (PROVENTIL HFA;VENTOLIN HFA) 108 (90 BASE) MCG/ACT inhaler Inhale 2 puffs into the lungs every 6 (six) hours as needed for shortness of breath.      . allopurinol (ZYLOPRIM) 100 MG tablet Take 1 tablet (100 mg total) by mouth daily.  90 tablet  1  . amLODipine (NORVASC) 10 MG tablet TAKE ONE TABLET BY MOUTH ONCE DAILY  90 tablet  0  . aspirin EC 81 MG tablet Take 81 mg by mouth every morning.      . Carboxymethylcellul-Glycerin (OPTIVE) 0.5-0.9 % SOLN Apply 1 drop to eye daily.      . cycloSPORINE (RESTASIS) 0.05 % ophthalmic emulsion Place 1 drop into both eyes 2 (two) times daily.      . diclofenac sodium (VOLTAREN) 1 % GEL Apply 1 application topically daily as needed (arthritis).      [name] diphenhydrAMINE (BENADRYL) 25 MG tablet Take 25 mg by mouth every 6 (six) hours as needed for allergies.      [name] FIBER FORMULA CAPS Take 1 capsule by mouth daily.       [name] gemfibrozil (LOPID) 600 MG tablet TAKE ONE-HALF TABLET BY MOUTH TWICE DAILY BEFORE MEAL(S)  90 tablet  1  . glucose blood test strip Use as instructed to test blood sugar 1 - 2 times daily.  100 each  12  . hydrALAZINE (APRESOLINE) 50 MG tablet Take 1 tablet (50 mg total) by mouth 3 (three) times daily.  90 tablet  1  . HYDROcodone-acetaminophen (NORCO/VICODIN) 5-325 MG per tablet Take 1 tablet by mouth every 6 (six) hours as needed for pain.  30 tablet  0  . levothyroxine (SYNTHROID, LEVOTHROID) 150 MCG tablet TAKE ONE TABLET BY MOUTH ONCE DAILY  90 tablet  1  . losartan (COZAAR)  100 MG tablet TAKE ONE TABLET BY MOUTH ONCE DAILY  30 tablet  3  . metoprolol succinate (TOPROL-XL) 25 MG 24 hr tablet TAKE ONE TABLET BY MOUTH ONCE DAILY  90 tablet  1  . metroNIDAZOLE (METROCREAM) 0.75 % cream Apply 1 application topically daily as needed (rosacia).      . multivitamin (THERAGRAN) per tablet Take 1 tablet by mouth daily.        . Omega-3 Fatty Acids (FISH OIL PO) Take 1 capsule by mouth 2 (two) times daily.      [name] omeprazole (PRILOSEC) 20 MG capsule Take 1 capsule (20 mg total) by mouth every morning.  90 capsule  1  . pravastatin (PRAVACHOL) 40 MG tablet TAKE ONE TABLET BY MOUTH ONCE DAILY  90 tablet  1  . sodium chloride (OCEAN) 0.65 % nasal spray Place 1 spray into both nostrils at bedtime as needed for congestion.       No current facility-administered medications on file prior to visit.    BP 130/70  Pulse 55  Temp(Src) 97.8 F (36.6 C) (Oral)  Resp 16  Ht 5\' 7"  (1.702 m)  Wt 177 lb (80.287 kg)  BMI 27.72 kg/m2  SpO2 98%        Objective:   Physical Exam  Constitutional: He is oriented to person, place, and time. He appears well-developed and well-nourished.  HENT:  Head: Normocephalic and atraumatic.  Cardiovascular: Normal rate, regular rhythm, normal heart sounds and intact distal pulses.   No murmur heard. Pulmonary/Chest: Effort normal and breath sounds normal.  Neurological: He is alert and oriented to person, place, and time.  Skin: Skin is warm and dry.          Assessment & Plan:  Continue current regimen. Advised him to elevate feet as needed for swelling. Bmet was done two weeks ago.  Follow up in 3 months.    I have personally seen and examined patient and agree with Jettie Booze NP student's assessment and plan.

## 2014-06-18 NOTE — Assessment & Plan Note (Signed)
>>  ASSESSMENT AND PLAN FOR BENIGN ESSENTIAL HYPERTENSION WRITTEN ON 06/18/2014  6:40 AM BY O'SULLIVAN, Amani Marseille, NP  BP stable, continue same.

## 2014-06-18 NOTE — Assessment & Plan Note (Signed)
BP stable, continue same.

## 2014-07-10 ENCOUNTER — Other Ambulatory Visit: Payer: Self-pay | Admitting: Family

## 2014-07-13 ENCOUNTER — Telehealth: Payer: Self-pay | Admitting: *Deleted

## 2014-07-13 MED ORDER — LOSARTAN POTASSIUM 100 MG PO TABS: ORAL_TABLET | ORAL | Status: DC

## 2014-07-13 NOTE — Telephone Encounter (Signed)
Received refill request from [facility] for 90 day supply of losartan. Rx sent, #90 x 1 refill.

## 2014-07-15 ENCOUNTER — Encounter: Payer: Self-pay | Admitting: Adult Health

## 2014-07-15 ENCOUNTER — Ambulatory Visit (INDEPENDENT_AMBULATORY_CARE_PROVIDER_SITE_OTHER): Payer: Medicare Other | Admitting: Adult Health

## 2014-07-15 ENCOUNTER — Encounter: Payer: Self-pay | Admitting: Neurology

## 2014-07-15 VITALS — BP 161/69 | HR 65 | Ht 67.0 in | Wt 180.6 lb

## 2014-07-15 DIAGNOSIS — Z9989 Dependence on other enabling machines and devices: Principal | ICD-10-CM

## 2014-07-15 DIAGNOSIS — G4733 Obstructive sleep apnea (adult) (pediatric): Secondary | ICD-10-CM

## 2014-07-15 NOTE — Progress Notes (Signed)
PATIENT: [name] Pratt DOB: May 11, 1941  REASON FOR VISIT: follow up HISTORY FROM: patient  HISTORY OF PRESENT ILLNESS: [name] Pratt is a 73 year old male with history of obstructive sleep Apnea. He returns today for a 30 day compliance download. He brought his machine with him today and his reports shows an AHI of 13.8 at 7cm of water with EPR 2, uses his machine for 6 hours and 13 minutes a night, with 97% compliance. The patient's percentage for using his machine for greater than or equal to 4 hours is 83%. His Epworth score is 5 points was previously 7 points. His fatigue severity score is 26. Patient reports that he goes to sleep pretty fast but will wake up often but is able to fall back to sleep. He is unsure what wakes him up. Overall patient feels that CPAP has improved his sleepiness and fatigue. Since the last visit the patient has had no new medical issues.   HISTORY 07/15/13 (CD): 73 y.o. male Is seen here as a revisit from Dr. Inda Castle for for follow up on CPAP compliance.   This patient had been followed as was the year 2007, 8 and 9. He was placed on VPAP originally, but developed in 2009 more sleepiness and interrupted sleep.  A repeat sleep studies still documented mostly central apnea, but he was re- titrated and responded well to CPAP at 7 cm of water.  His last yearly CPAP revisit was on 07-18-12. reported he had no longer insomnia- his Epworth sleepiness score at the time was 7 points his fatigue score 18 points but he presented with red blotchy spots at the bridge of his nose and under both eyes. He needed caffeine in the day but only restricted himself to in the morning. He reportedly slept about 8 hours but not en bloc. And download in 2013 children average daytime and therapy of 5 hours and 17 minutes, residual AHI of 7.3 that pressure at 7 cm water with 2 cm EPR. The patient used the machine 73/78 days. He will week spontaneously without the help of an alarm.  He will drink caffeinated beverages but only in the morning. He will have one bathroom break each night The patient underwent a hip replacement in April 2014, has recovered well. He still has the blotchy reddening of his eyes but reported he has dry eyes and uses a lot of her spaces.  Today's fatigue severity score is 22 points and his Epworth sleepiness score is 12 points the geriatric depression score is endorsed at 2 points. His sleep habits remaining as an established before. His bedtime is around 11 PM he falls asleep within 5 minutes or less. He sleeps for about 4 hours then he wakes up every hour after that, he attributes this arousals to pain shoulder or back pain joint pain. He has osteoarthritis. He finally rises in the morning around 8 AM. Have one bathroom break at night. Laughingly reports that his wife's herself smokes love to hear him. He is not aware of still having any breakthrough snoring or apneas.  His brother died in 0947 as complications of alcoholism, cancer of the liver and bones  REVIEW OF SYSTEMS: Full 14 system review of systems performed and notable only for:  Constitutional: activity change Eyes: N/A Ear/Nose/Throat: hearing loss Skin: itching Cardiovascular: leg swelling Respiratory: N/A  Gastrointestinal: swollen abdomen  Genitourinary: N/A Hematology/Lymphatic: bruise/bleed easily Endocrine: N/A Musculoskeletal:joint swelling Allergy/Immunology: N/A  Neurological: N/A Psychiatric: N/A Sleep: restless leg,  frequent waking, daytime sleepiness   ALLERGIES: Allergies  Allergen Reactions  . Metoclopramide Hcl     anxiety    HOME MEDICATIONS: Outpatient Prescriptions Prior to Visit  Medication Sig Dispense Refill  . acetaminophen (TYLENOL) 500 MG tablet Take 500 mg by mouth every 6 (six) hours as needed for moderate pain.    [name] albuterol (PROVENTIL HFA;VENTOLIN HFA) 108 (90 BASE) MCG/ACT inhaler Inhale 2 puffs into the lungs every 6 (six) hours as  needed for shortness of breath.    . allopurinol (ZYLOPRIM) 100 MG tablet Take 1 tablet (100 mg total) by mouth daily. 90 tablet 1  . allopurinol (ZYLOPRIM) 300 MG tablet Take 300 mg by mouth daily.    [name] amLODipine (NORVASC) 10 MG tablet TAKE ONE TABLET BY MOUTH ONCE DAILY 90 tablet 1  . aspirin EC 81 MG tablet Take 81 mg by mouth every morning.    . Carboxymethylcellul-Glycerin (OPTIVE) 0.5-0.9 % SOLN Apply 1 drop to eye daily.    . cycloSPORINE (RESTASIS) 0.05 % ophthalmic emulsion Place 1 drop into both eyes 2 (two) times daily.    . diclofenac sodium (VOLTAREN) 1 % GEL Apply 1 application topically daily as needed (arthritis).    [name] diphenhydrAMINE (BENADRYL) 25 MG tablet Take 25 mg by mouth every 6 (six) hours as needed for allergies.    [name] FIBER FORMULA CAPS Take 1 capsule by mouth daily.     [name] gemfibrozil (LOPID) 600 MG tablet TAKE ONE-HALF TABLET BY MOUTH TWICE DAILY BEFORE MEAL(S) 90 tablet 1  . glucose blood test strip Use as instructed to test blood sugar 1 - 2 times daily. 100 each 12  . hydrALAZINE (APRESOLINE) 50 MG tablet Take 1 tablet (50 mg total) by mouth 3 (three) times daily. 90 tablet 1  . HYDROcodone-acetaminophen (NORCO/VICODIN) 5-325 MG per tablet Take 1 tablet by mouth every 6 (six) hours as needed for pain. 30 tablet 0  . levothyroxine (SYNTHROID, LEVOTHROID) 150 MCG tablet TAKE ONE TABLET BY MOUTH ONCE DAILY 90 tablet 1  . losartan (COZAAR) 100 MG tablet TAKE ONE TABLET BY MOUTH ONCE DAILY 90 tablet 1  . metoprolol succinate (TOPROL-XL) 25 MG 24 hr tablet TAKE ONE TABLET BY MOUTH ONCE DAILY 90 tablet 1  . metroNIDAZOLE (METROCREAM) 0.75 % cream Apply 1 application topically daily as needed (rosacia).    . multivitamin (THERAGRAN) per tablet Take 1 tablet by mouth daily.      . Omega-3 Fatty Acids (FISH OIL PO) Take 1 capsule by mouth 2 (two) times daily.    [name] omeprazole (PRILOSEC) 20 MG capsule Take 1 capsule (20 mg total) by mouth every morning. 90 capsule 1  .  pravastatin (PRAVACHOL) 40 MG tablet TAKE ONE TABLET BY MOUTH ONCE DAILY 90 tablet 1  . sodium chloride (OCEAN) 0.65 % nasal spray Place 1 spray into both nostrils at bedtime as needed for congestion.     No facility-administered medications prior to visit.    PAST MEDICAL HISTORY: Past Medical History  Diagnosis Date  . Hyperlipidemia     under control  . Chest pain      Myoview in 2008 was normal.  Echo 7/09: EF 60%, normal wall motion, mild LVH, mild LAE.  . Inguinal hernia   . Pancreatitis   . Rosacea   . Gout   . Arthritis   . History of chicken pox   . GERD (gastroesophageal reflux disease)   . History of hemorrhoids   . OSA (obstructive sleep apnea)   .  Carotid stenosis     a. Carotid U/S 3/64: LICA < 68%, RICA 03-21%;  b.  Carotid U/S 2/24:  RICA 82-50%; LICA 0-37%; f/u 1 year  . Hypertension     under control  . COPD (chronic obstructive pulmonary disease)   . Hypothyroidism   . History of kidney stones   . Cancer     skin cancer  . Complication of anesthesia     "stomach does not wake up"  . Ileus   . PONV (postoperative nausea and vomiting)     PAST SURGICAL HISTORY: Past Surgical History  Procedure Laterality Date  . Laparoscopic cholecystectomy  09/20/06    with intraoperative cholangiogram and right inguinal herniorrhaphy with mesh   . Hiatal hernia repair  12/21/93  . Knee surgery  1995  . Hemorrhoid surgery  2014  . Lithotripsy      x2  . Abdominal hernia repair  07/15/12    Dr [name]  . Nasal septum surgery  1975  . Esophageal dilation  1993 and 1994    multiple times  . Brow lift  05/07/01  . Urethral dilation  1991    [name]  . Epidural injections      multiple procedures  . 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 Ligament repair Left 07/2013    shoulder  . Skin cancer destruction      nose, ear  . Inguinal hernia repair Right 07/15/12    Dr [name], x2  . Joint replacement  07/25/04    right knee  . Joint replacement  07/13/10    left  knee  . Total hip arthroplasty Left   . Eye surgery Bilateral 03/25/10, 2012    cataract removal  . Refractive surgery Bilateral   . Tonsillectomy  as child  . Cystoscopy with ureteroscopy and stent placement Right 01/28/2014    Procedure: CYSTOSCOPY WITH URETEROSCOPY, BASKET RETRIVAL AND  STENT PLACEMENT;  Surgeon: [name], MD;  Location: [facility];  Service: Urology;  Laterality: Right;  . Holmium laser application Right 0/48/8891    Procedure: HOLMIUM LASER APPLICATION;  Surgeon: [name], MD;  Location: [facility];  Service: Urology;  Laterality: Right;    FAMILY HISTORY: Family History  Problem Relation Age of Onset  . Hypertension    . Cancer    . Osteoarthritis      SOCIAL HISTORY: History   Social History  . Marital Status: Married    Spouse Name: N/A    Number of Children: 1  . 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 Smoking status: Former Smoker -- 11 years    Types: Cigarettes    Quit date: 09/11/1978  . Smokeless tobacco: Never Used  . Alcohol Use: Yes     Comment: rare  . Drug Use: No  . Sexual Activity: Not on file   Other Topics Concern  . Not on file   Social History Narrative   Lives with his wife   He has one son- lives locally.   2 grandchildren   Has worked for fire department   Part time pain business   Enjoys wood working   Completed HS.  [employer]      PHYSICAL EXAM  Filed Vitals:   07/15/14 1043  BP: 161/69  Pulse: 65  Height: 5\' 7"  (1.702 m)  Weight: 180 lb 9.6 oz (  81.92 kg)   Body mass index is 28.28 kg/(m^2).  Generalized: Well developed, in no acute distress   Neurological examination  Mentation: Alert oriented to time, place, history taking. Follows all commands speech and language fluent Cranial nerve II-XII: Pupils were equal round reactive to light. Extraocular movements were full, visual field were full on confrontational test.  Facial sensation and strength were normal. Uvula tongue midline. Head turning and shoulder shrug  were normal and symmetric. Motor: The motor testing reveals 5 over 5 strength of all 4 extremities. Good symmetric motor tone is noted throughout.  Sensory: Sensory testing is intact to soft touch on all 4 extremities. No evidence of extinction is noted.  Coordination: Cerebellar testing reveals good finger-nose-finger and heel-to-shin bilaterally.  Gait and station: Gait is normal.  Romberg is negative. No drift is seen.  Reflexes: Deep tendon reflexes are symmetric and normal bilaterally.    DIAGNOSTIC DATA (LABS, IMAGING, TESTING) - I reviewed patient records, labs, notes, testing and imaging myself where available.  Lab Results  Component Value Date   WBC 5.9 06/03/2014   HGB 14.3 06/03/2014   HCT 42.9 06/03/2014   MCV 97.6 06/03/2014   PLT 386.0 06/03/2014      Component Value Date/Time   NA 138 06/03/2014 1046   K 4.9 06/03/2014 1046   CL 103 06/03/2014 1046   CO2 30 06/03/2014 1046   GLUCOSE 112* 06/03/2014 1046   BUN 20 06/03/2014 1046   CREATININE 1.1 06/03/2014 1046   CREATININE 0.95 05/26/2013 1134   CALCIUM 9.7 06/03/2014 1046   PROT 7.6 03/03/2014 1014   ALBUMIN 4.6 03/03/2014 1014   AST 21 03/03/2014 1014   ALT 19 03/03/2014 1014   ALKPHOS 82 03/03/2014 1014   BILITOT 0.6 03/03/2014 1014   GFRNONAA 48* 01/17/2014 0400   GFRNONAA 73 11/25/2012 1103   GFRAA 56* 01/17/2014 0400   GFRAA 84 11/25/2012 1103   Lab Results  Component Value Date   CHOL 146 08/25/2013   HDL 35* 08/25/2013   LDLCALC 84 08/25/2013   TRIG 137 08/25/2013   CHOLHDL 4.2 08/25/2013   Lab Results  Component Value Date   HGBA1C 6.0 06/03/2014   No results found for: [id number] Lab Results  Component Value Date   TSH 0.976 03/03/2014      ASSESSMENT AND PLAN 73 y.o. year old male  has a past medical history of Hyperlipidemia; Chest pain; Inguinal hernia; Pancreatitis; Rosacea;  Gout; Arthritis; History of chicken pox; GERD (gastroesophageal reflux disease); History of hemorrhoids; OSA (obstructive sleep apnea); Carotid stenosis; Hypertension; COPD (chronic obstructive pulmonary disease); Hypothyroidism; History of kidney stones; Cancer; Complication of anesthesia; Ileus; and PONV (postoperative nausea and vomiting). here with:  1. Obstructive sleep apnea on CPAP  Patient's AHI is high at 13.8. His compliance is okay,  patient does not have a leak. According to his download his apnea events are primarily due to OSA. I consulted with Dr. Rexene Alberts. At this time I will increase the patient's pressure setting to 9 cm of water. The patient will followup in 2 months for a 30 day download with his new pressure setting.   Ward Givens, MSN, NP-C 07/15/2014, 11:14 AM [facility] [address] [place], [place] [address] [phone]  Note: This document was prepared with digital dictation and possible smart phrase technology. Any transcriptional errors that result from this process are unintentional.

## 2014-07-15 NOTE — Progress Notes (Signed)
I agree with the assessment and plan as directed by NP .The patient is known to me .   Meaghan Whistler, MD  

## 2014-07-15 NOTE — Patient Instructions (Signed)
Sleep Apnea  Sleep apnea is a sleep disorder characterized by abnormal pauses in breathing while you sleep. When your breathing pauses, the level of oxygen in your blood decreases. This causes you to move out of deep sleep and into light sleep. As a result, your quality of sleep is poor, and the system that carries your blood throughout your body (cardiovascular system) experiences stress. If sleep apnea remains untreated, the following conditions can develop:  High blood pressure (hypertension).  Coronary artery disease.  Inability to achieve or maintain an erection (impotence).  Impairment of your thought process (cognitive dysfunction). There are three types of sleep apnea: 1. Obstructive sleep apnea--Pauses in breathing during sleep because of a blocked airway. 2. Central sleep apnea--Pauses in breathing during sleep because the area of the brain that controls your breathing does not send the correct signals to the muscles that control breathing. 3. Mixed sleep apnea--A combination of both obstructive and central sleep apnea. RISK FACTORS The following risk factors can increase your risk of developing sleep apnea:  Being overweight.  Smoking.  Having narrow passages in your nose and throat.  Being of older age.  Being male.  Alcohol use.  Sedative and tranquilizer use.  Ethnicity. Among individuals younger than 35 years, African Americans are at increased risk of sleep apnea. SYMPTOMS   Difficulty staying asleep.  Daytime sleepiness and fatigue.  Loss of energy.  Irritability.  Loud, heavy snoring.  Morning headaches.  Trouble concentrating.  Forgetfulness.  Decreased interest in sex. DIAGNOSIS  In order to diagnose sleep apnea, your caregiver will perform a physical examination. Your caregiver may suggest that you take a home sleep test. Your caregiver may also recommend that you spend the night in a sleep lab. In the sleep lab, several monitors record  information about your heart, lungs, and brain while you sleep. Your leg and arm movements and blood oxygen level are also recorded. TREATMENT The following actions may help to resolve mild sleep apnea:  Sleeping on your side.   Using a decongestant if you have nasal congestion.   Avoiding the use of depressants, including alcohol, sedatives, and narcotics.   Losing weight and modifying your diet if you are overweight. There also are devices and treatments to help open your airway:  Oral appliances. These are custom-made mouthpieces that shift your lower jaw forward and slightly open your bite. This opens your airway.  Devices that create positive airway pressure. This positive pressure "splints" your airway open to help you breathe better during sleep. The following devices create positive airway pressure:  Continuous positive airway pressure (CPAP) device. The CPAP device creates a continuous level of air pressure with an air pump. The air is delivered to your airway through a mask while you sleep. This continuous pressure keeps your airway open.  Nasal expiratory positive airway pressure (EPAP) device. The EPAP device creates positive air pressure as you exhale. The device consists of single-use valves, which are inserted into each nostril and held in place by adhesive. The valves create very little resistance when you inhale but create much more resistance when you exhale. That increased resistance creates the positive airway pressure. This positive pressure while you exhale keeps your airway open, making it easier to breath when you inhale again.  Bilevel positive airway pressure (BPAP) device. The BPAP device is used mainly in patients with central sleep apnea. This device is similar to the CPAP device because it also uses an air pump to deliver continuous air pressure   through a mask. However, with the BPAP machine, the pressure is set at two different levels. The pressure when you  exhale is lower than the pressure when you inhale.  Surgery. Typically, surgery is only done if you cannot comply with less invasive treatments or if the less invasive treatments do not improve your condition. Surgery involves removing excess tissue in your airway to create a wider passage way. Document Released: 08/18/2002 Document Revised: 12/23/2012 Document Reviewed: 01/04/2012 [employer] Patient Information 2015 [employer]. This information is not intended to replace advice given to you by your health care provider. Make sure you discuss any questions you have with your health care provider.  

## 2014-07-27 ENCOUNTER — Other Ambulatory Visit: Payer: Self-pay | Admitting: Family

## 2014-08-09 ENCOUNTER — Other Ambulatory Visit: Payer: Self-pay | Admitting: Family

## 2014-09-16 ENCOUNTER — Ambulatory Visit (INDEPENDENT_AMBULATORY_CARE_PROVIDER_SITE_OTHER): Payer: Medicare Other | Admitting: Adult Health

## 2014-09-16 ENCOUNTER — Encounter: Payer: Self-pay | Admitting: Adult Health

## 2014-09-16 ENCOUNTER — Encounter: Payer: Self-pay | Admitting: Neurology

## 2014-09-16 VITALS — BP 180/76 | HR 63 | Ht 67.0 in | Wt 182.6 lb

## 2014-09-16 DIAGNOSIS — G4733 Obstructive sleep apnea (adult) (pediatric): Secondary | ICD-10-CM

## 2014-09-16 DIAGNOSIS — Z9989 Dependence on other enabling machines and devices: Principal | ICD-10-CM

## 2014-09-16 DIAGNOSIS — G4719 Other hypersomnia: Secondary | ICD-10-CM

## 2014-09-16 NOTE — Progress Notes (Signed)
PATIENT: [name] Pratt DOB: 29-Jul-1941  REASON FOR VISIT: follow up HISTORY FROM: patient CHIEF COMPLAINT: OSA on CPAP and daytime sleepiness  HISTORY OF PRESENT ILLNESS: [name] Pratt is a 74 year old male with history of obstructive sleep Apnea. He returns today for a 30 daydownload. At the last visit his AHI was 13.8 so his pressure setting was adjusted to 9cm.  He brought his machine with him today but we are unable to get a recent download. the patient assures me that his DME company changed his settings and he has been using his machine nightly. Overall he doesn't notice a significant change with the pressure change other than sometimes the pressure is "strong" and can blow the mask off. The patient states that he feels that he is getting good sleep. If he sits down in front of a computer or tv after getting up he may tend to doze off. He still notices some daytime sleepiness. He gets about 7 hours of sleep. He does feel that the CPAP has helped.   ADDEDUM: We were able to get a download from the patient's DME: it showed an AHI of 14.4 on 9 cm H2O with EPR 2. His compliance is 90%. No leak noted. He uses his machine on average 6 hours and 40 minutes.   HISTORY 07/15/13 (CD): 74 y.o. male Is seen here as a revisit from Dr. Inda Castle for for follow up on CPAP compliance. This patient had been followed as was the year 2007, 8 and 9. He was placed on VPAP originally, but developed in 2009 more sleepiness and interrupted sleep. A repeat sleep studies still documented mostly central apnea, but he was re- titrated and responded well to CPAP at 7 cm of water.His last yearly CPAP revisit was on 07-18-12. reported he had no longer insomnia- his Epworth sleepiness score at the time was 7 points his fatigue score 18 points but he presented with red blotchy spots at the bridge of his nose and under both eyes. He needed caffeine in the day but only restricted himself to in the morning. He  reportedly slept about 8 hours but not en bloc. And download in 2013 children average daytime and therapy of 5 hours and 17 minutes, residual AHI of 7.3 that pressure at 7 cm water with 2 cm EPR. The patient used the machine 73/78 days. He will week spontaneously without the help of an alarm. He will drink caffeinated beverages but only in the morning. He will have one bathroom break each night The patient underwent a hip replacement in April 2014, has recovered well. He still has the blotchy reddening of his eyes but reported he has dry eyes and uses a lot of her spaces.  Today's fatigue severity score is 22 points and his Epworth sleepiness score is 12 points the geriatric depression score is endorsed at 2 points. His sleep habits remaining as an established before. His bedtime is around 11 PM he falls asleep within 5 minutes or less. He sleeps for about 4 hours then he wakes up every hour after that, he attributes this arousals to pain shoulder or back pain joint pain. He has osteoarthritis. He finally rises in the morning around 8 AM. Have one bathroom break at night. Laughingly reports that his wife's herself smokes love to hear him. He is not aware of still having any breakthrough snoring or apneas.  His brother died in 0354 as complications of alcoholism, cancer of the liver and bones  REVIEW OF SYSTEMS: Out of a complete 14 system review of symptoms, the patient complains only of the following symptoms, and all other reviewed systems are negative.  Fatigue Hearing loss Restless leg Daytime sleepiness  Itching Urgency Bruise/bleed easily   ALLERGIES: Allergies  Allergen Reactions  . Metoclopramide Hcl     anxiety    HOME MEDICATIONS: Outpatient Prescriptions Prior to Visit  Medication Sig Dispense Refill  . acetaminophen (TYLENOL) 500 MG tablet Take 500 mg by mouth every 6 (six) hours as needed for moderate pain.    [name] albuterol (PROVENTIL HFA;VENTOLIN HFA) 108 (90 BASE)  MCG/ACT inhaler Inhale 2 puffs into the lungs every 6 (six) hours as needed for shortness of breath.    . allopurinol (ZYLOPRIM) 100 MG tablet Take 1 tablet (100 mg total) by mouth daily. 90 tablet 1  . allopurinol (ZYLOPRIM) 300 MG tablet Take 300 mg by mouth daily.    [name] amLODipine (NORVASC) 10 MG tablet TAKE ONE TABLET BY MOUTH ONCE DAILY 90 tablet 1  . aspirin EC 81 MG tablet Take 81 mg by mouth every morning.    . Carboxymethylcellul-Glycerin (OPTIVE) 0.5-0.9 % SOLN Apply 1 drop to eye daily.    . cycloSPORINE (RESTASIS) 0.05 % ophthalmic emulsion Place 1 drop into both eyes 2 (two) times daily.    . diclofenac sodium (VOLTAREN) 1 % GEL Apply 1 application topically daily as needed (arthritis).    [name] diphenhydrAMINE (BENADRYL) 25 MG tablet Take 25 mg by mouth every 6 (six) hours as needed for allergies.    [name] FIBER FORMULA CAPS Take 1 capsule by mouth daily.     [name] gemfibrozil (LOPID) 600 MG tablet TAKE ONE-HALF TABLET BY MOUTH TWICE DAILY BEFORE MEAL(S) 90 tablet 1  . glucose blood test strip Use as instructed to test blood sugar 1 - 2 times daily. 100 each 12  . hydrALAZINE (APRESOLINE) 50 MG tablet TAKE ONE TABLET BY MOUTH THREE TIMES DAILY 90 tablet 2  . HYDROcodone-acetaminophen (NORCO/VICODIN) 5-325 MG per tablet Take 1 tablet by mouth every 6 (six) hours as needed for pain. 30 tablet 0  . levothyroxine (SYNTHROID, LEVOTHROID) 150 MCG tablet TAKE ONE TABLET BY MOUTH ONCE DAILY 90 tablet 1  . losartan (COZAAR) 100 MG tablet TAKE ONE TABLET BY MOUTH ONCE DAILY 90 tablet 1  . metoprolol succinate (TOPROL-XL) 25 MG 24 hr tablet TAKE ONE TABLET BY MOUTH ONCE DAILY 90 tablet 1  . metroNIDAZOLE (METROCREAM) 0.75 % cream Apply 1 application topically daily as needed (rosacia).    . multivitamin (THERAGRAN) per tablet Take 1 tablet by mouth daily.      . Omega-3 Fatty Acids (FISH OIL PO) Take 1 capsule by mouth 2 (two) times daily.    [name] omeprazole (PRILOSEC) 20 MG capsule Take 1 capsule (20 mg  total) by mouth every morning. 90 capsule 1  . pravastatin (PRAVACHOL) 40 MG tablet TAKE ONE TABLET BY MOUTH ONCE DAILY 90 tablet 1  . sodium chloride (OCEAN) 0.65 % nasal spray Place 1 spray into both nostrils at bedtime as needed for congestion.     No facility-administered medications prior to visit.    PAST MEDICAL HISTORY: Past Medical History  Diagnosis Date  . Hyperlipidemia     under control  . Chest pain      Myoview in 2008 was normal.  Echo 7/09: EF 60%, normal wall motion, mild LVH, mild LAE.  . Inguinal hernia   . Pancreatitis   . Rosacea   .  Gout   . Arthritis   . History of chicken pox   . GERD (gastroesophageal reflux disease)   . History of hemorrhoids   . OSA (obstructive sleep apnea)   . Carotid stenosis     a. Carotid U/S 0/17: LICA < 51%, RICA 02-58%;  b.  Carotid U/S 5/27:  RICA 78-24%; LICA 2-35%; f/u 1 year  . Hypertension     under control  . COPD (chronic obstructive pulmonary disease)   . Hypothyroidism   . History of kidney stones   . Cancer     skin cancer  . Complication of anesthesia     "stomach does not wake up"  . Ileus   . PONV (postoperative nausea and vomiting)     PAST SURGICAL HISTORY: Past Surgical History  Procedure Laterality Date  . Laparoscopic cholecystectomy  09/20/06    with intraoperative cholangiogram and right inguinal herniorrhaphy with mesh   . Hiatal hernia repair  12/21/93  . Knee surgery  1995  . Hemorrhoid surgery  2014  . Lithotripsy      x2  . Abdominal hernia repair  07/15/12    Dr [name]  . Nasal septum surgery  1975  . Esophageal dilation  1993 and 1994    multiple times  . Brow lift  05/07/01  . Urethral dilation  1991    [name]  . Epidural injections      multiple procedures  . 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 Ligament repair Left 07/2013    shoulder  . Skin cancer destruction      nose, ear  . Inguinal hernia repair Right 07/15/12    Dr [name], x2  . Joint replacement  07/25/04    right  knee  . Joint replacement  07/13/10    left knee  . Total hip arthroplasty Left   . Eye surgery Bilateral 03/25/10, 2012    cataract removal  . Refractive surgery Bilateral   . Tonsillectomy  as child  . Cystoscopy with ureteroscopy and stent placement Right 01/28/2014    Procedure: CYSTOSCOPY WITH URETEROSCOPY, BASKET RETRIVAL AND  STENT PLACEMENT;  Surgeon: [name], MD;  Location: [facility];  Service: Urology;  Laterality: Right;  . Holmium laser application Right 3/61/4431    Procedure: HOLMIUM LASER APPLICATION;  Surgeon: [name], MD;  Location: [facility];  Service: Urology;  Laterality: Right;    FAMILY HISTORY: Family History  Problem Relation Age of Onset  . Hypertension    . Cancer    . Osteoarthritis      SOCIAL HISTORY: History   Social History  . Marital Status: Married    Spouse Name: N/A    Number of Children: 1  . 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 Smoking status: Former Smoker -- 11 years    Types: Cigarettes    Quit date: 09/11/1978  . Smokeless tobacco: Never Used  . Alcohol Use: Yes     Comment: rare  . Drug Use: No  . Sexual Activity: Not on file   Other Topics Concern  . Not on file   Social History Narrative   Lives with his wife   He has one son- lives locally.   2 grandchildren   Has worked for fire department   Part time pain business   Enjoys wood working   Completed HS.  [employer]      PHYSICAL EXAM  Filed Vitals:   09/16/14 1108  BP: 180/76  Pulse: 63  Height: 5\' 7"  (1.702 m)  Weight: 182 lb 9.6 oz (82.827 kg)   Body mass index is 28.59 kg/(m^2).  Generalized: Well developed, in no acute distress  Neck: Circumference 16.75 inches, Mallampati 2+  Neurological examination  Mentation: Alert oriented to time, place, history taking. Follows all commands speech and language fluent Cranial nerve II-XII: Pupils were equal round  reactive to light. Extraocular movements were full, visual field were full on confrontational test. Facial sensation and strength were normal. Uvula tongue midline. Head turning and shoulder shrug  were normal and symmetric. Motor: The motor testing reveals 5 over 5 strength of all 4 extremities. Good symmetric motor tone is noted throughout.  Sensory: Sensory testing is intact to soft touch on all 4 extremities. No evidence of extinction is noted.  Coordination: Cerebellar testing reveals good finger-nose-finger and heel-to-shin bilaterally.  Gait and station: Gait is normal. Tandem gait is normal. Romberg is negative. No drift is seen.  Reflexes: Deep tendon reflexes are symmetric and normal bilaterally.   DIAGNOSTIC DATA (LABS, IMAGING, TESTING) - I reviewed patient records, labs, notes, testing and imaging myself where available.  Lab Results  Component Value Date   WBC 5.9 06/03/2014   HGB 14.3 06/03/2014   HCT 42.9 06/03/2014   MCV 97.6 06/03/2014   PLT 386.0 06/03/2014      Component Value Date/Time   NA 138 06/03/2014 1046   K 4.9 06/03/2014 1046   CL 103 06/03/2014 1046   CO2 30 06/03/2014 1046   GLUCOSE 112* 06/03/2014 1046   BUN 20 06/03/2014 1046   CREATININE 1.1 06/03/2014 1046   CREATININE 0.95 05/26/2013 1134   CALCIUM 9.7 06/03/2014 1046   PROT 7.6 03/03/2014 1014   ALBUMIN 4.6 03/03/2014 1014   AST 21 03/03/2014 1014   ALT 19 03/03/2014 1014   ALKPHOS 82 03/03/2014 1014   BILITOT 0.6 03/03/2014 1014   GFRNONAA 48* 01/17/2014 0400   GFRNONAA 73 11/25/2012 1103   GFRAA 56* 01/17/2014 0400   GFRAA 84 11/25/2012 1103   Lab Results  Component Value Date   CHOL 146 08/25/2013   HDL 35* 08/25/2013   LDLCALC 84 08/25/2013   TRIG 137 08/25/2013   CHOLHDL 4.2 08/25/2013   Lab Results  Component Value Date   HGBA1C 6.0 06/03/2014   No results found for: [id number] Lab Results  Component Value Date   TSH 0.976 03/03/2014      ASSESSMENT AND PLAN 74  y.o. year old male  has a past medical history of Hyperlipidemia; Chest pain; Inguinal hernia; Pancreatitis; Rosacea; Gout; Arthritis; History of chicken pox; GERD (gastroesophageal reflux disease); History of hemorrhoids; OSA (obstructive sleep apnea); Carotid stenosis; Hypertension; COPD (chronic obstructive pulmonary disease); Hypothyroidism; History of kidney stones; Cancer; Complication of anesthesia; Ileus; and PONV (postoperative nausea and vomiting). here with:  1. OSA on CPAP 2. Daytime sleepiness  Patient's downgoing still indicates a high AHI of 14.4. I have consulted with Dr. Brett Fairy and we will change the patient to auto titration pressure set between 5-10 cm of water. We will also change the patient's mask to a dream wear mask. I will have to order sent over to his DME company. We will see the patient back in 2 months for a compliance download. Since we received a download after the patient's visit I will call the patient and make him aware of  the new orders and download results.   Ward Givens, MSN, NP-C 09/16/2014, 11:03 AM [facility] [address] [place], [place] [address] [phone]  Note: This document was prepared with digital dictation and possible smart phrase technology. Any transcriptional errors that result from this process are unintentional.

## 2014-09-17 NOTE — Progress Notes (Signed)
I agree with the assessment and plan as directed by NP .The patient is known to me .   Ryland Smoots, MD  

## 2014-09-18 ENCOUNTER — Ambulatory Visit (INDEPENDENT_AMBULATORY_CARE_PROVIDER_SITE_OTHER): Payer: Medicare Other | Admitting: Family

## 2014-09-18 ENCOUNTER — Encounter: Payer: Self-pay | Admitting: Family

## 2014-09-18 VITALS — BP 120/76 | HR 60 | Temp 97.6°F | Resp 18 | Ht 67.0 in | Wt 183.8 lb

## 2014-09-18 DIAGNOSIS — G4733 Obstructive sleep apnea (adult) (pediatric): Secondary | ICD-10-CM

## 2014-09-18 DIAGNOSIS — R739 Hyperglycemia, unspecified: Secondary | ICD-10-CM

## 2014-09-18 DIAGNOSIS — K219 Gastro-esophageal reflux disease without esophagitis: Secondary | ICD-10-CM

## 2014-09-18 DIAGNOSIS — E785 Hyperlipidemia, unspecified: Secondary | ICD-10-CM

## 2014-09-18 DIAGNOSIS — E039 Hypothyroidism, unspecified: Secondary | ICD-10-CM

## 2014-09-18 DIAGNOSIS — G4730 Sleep apnea, unspecified: Secondary | ICD-10-CM

## 2014-09-18 DIAGNOSIS — I10 Essential (primary) hypertension: Secondary | ICD-10-CM

## 2014-09-18 NOTE — Assessment & Plan Note (Signed)
Tolerating statin, obtain follow up FLP

## 2014-09-18 NOTE — Assessment & Plan Note (Signed)
Stable, continue synthroid

## 2014-09-18 NOTE — Assessment & Plan Note (Signed)
This is being managed byDr. Dohmeier.

## 2014-09-18 NOTE — Progress Notes (Signed)
Pre visit review using our clinic review tool, if applicable. No additional management support is needed unless otherwise documented below in the visit note. 

## 2014-09-18 NOTE — Patient Instructions (Signed)
Please schedule lab visit at the front desk. Please schedule a follow up appointment in 3 months.

## 2014-09-18 NOTE — Assessment & Plan Note (Signed)
Stable on current meds, continue same.  

## 2014-09-18 NOTE — Progress Notes (Signed)
Subjective:    Patient ID: [name] Pratt, male    DOB: 1941-07-05, 74 y.o.   MRN: [id number]  HPI  Mr. Silvernail is a 74 yr old male who presents today for follow up of multiple medical problems:  1) HTN- Patient is currently maintained on the following medications for blood pressure: amlodipine, amlodipine, losartan, toprol xl.  Patient reports good compliance with blood pressure medications. Patient denies chest pain, shortness of breath- is mild unchanged- occurs with exertion. LE edema- mild.  Last 3 blood pressure readings in our office are as follows: BP Readings from Last 3 Encounters:  09/18/14 120/76  09/16/14 180/76  07/15/14 161/69   2) Hypothyroid- maintained on synthroid 140mcg. Stable on current dose.   3) Hyperlipidemia-  Maintained on pravastatin.  Lab Results  Component Value Date   CHOL 146 08/25/2013   HDL 35* 08/25/2013   LDLCALC 84 08/25/2013   TRIG 137 08/25/2013   CHOLHDL 4.2 08/25/2013   4) GERD- has hx of esophageal strictures,prior to repair of hiatal hernia.   5) OSA- pt continues to have daytime sleepiness despite compliance with cpap.  Has upcoming appointment with neuro to work on adjusting his CPAP. (Dr. Maureen Chatters).     Review of Systems See HPI  Past Medical History  Diagnosis Date  . Hyperlipidemia     under control  . Chest pain      Myoview in 2008 was normal.  Echo 7/09: EF 60%, normal wall motion, mild LVH, mild LAE.  . Inguinal hernia   . Pancreatitis   . Rosacea   . Gout   . Arthritis   . History of chicken pox   . GERD (gastroesophageal reflux disease)   . History of hemorrhoids   . OSA (obstructive sleep apnea)   . Carotid stenosis     a. Carotid U/S 5/39: LICA < 76%, RICA 73-41%;  b.  Carotid U/S 9/37:  RICA 90-24%; LICA 0-97%; f/u 1 year  . Hypertension     under control  . COPD (chronic obstructive pulmonary disease)   . Hypothyroidism   . History of kidney stones   . Cancer     skin cancer  . Complication of  anesthesia     "stomach does not wake up"  . Ileus   . PONV (postoperative nausea and vomiting)     History   Social History  . Marital Status: Married    Spouse Name: N/A    Number of Children: 1  . 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 Smoking status: Former Smoker -- 11 years    Types: Cigarettes    Quit date: 09/11/1978  . Smokeless tobacco: Never Used  . Alcohol Use: Yes     Comment: rare  . Drug Use: No  . Sexual Activity: Not on file   Other Topics Concern  . Not on file   Social History Narrative   Lives with his wife   He has one son- lives locally.   2 grandchildren   Has worked for fire department   Part time pain business   Enjoys wood working   Completed HS.  [employer]    Past Surgical History  Procedure Laterality Date  . Laparoscopic cholecystectomy  09/20/06    with intraoperative cholangiogram and right inguinal herniorrhaphy with mesh   . Hiatal hernia repair  12/21/93  .  Knee surgery  1995  . Hemorrhoid surgery  2014  . Lithotripsy      x2  . Abdominal hernia repair  07/15/12    Dr [name]  . Nasal septum surgery  1975  . Esophageal dilation  1993 and 1994    multiple times  . Brow lift  05/07/01  . Urethral dilation  1991    [name]  . Epidural injections      multiple procedures  . 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 Ligament repair Left 07/2013    shoulder  . Skin cancer destruction      nose, ear  . Inguinal hernia repair Right 07/15/12    Dr [name], x2  . Joint replacement  07/25/04    right knee  . Joint replacement  07/13/10    left knee  . Total hip arthroplasty Left   . Eye surgery Bilateral 03/25/10, 2012    cataract removal  . Refractive surgery Bilateral   . Tonsillectomy  as child  . Cystoscopy with ureteroscopy and stent placement Right 01/28/2014    Procedure: CYSTOSCOPY WITH URETEROSCOPY, BASKET RETRIVAL AND  STENT PLACEMENT;   Surgeon: [name], MD;  Location: [facility];  Service: Urology;  Laterality: Right;  . Holmium laser application Right 0/62/6948    Procedure: HOLMIUM LASER APPLICATION;  Surgeon: [name], MD;  Location: [facility];  Service: Urology;  Laterality: Right;    Family History  Problem Relation Age of Onset  . Hypertension    . Cancer    . Osteoarthritis      Allergies  Allergen Reactions  . Metoclopramide Hcl     anxiety    Current Outpatient Prescriptions on File Prior to Visit  Medication Sig Dispense Refill  . acetaminophen (TYLENOL) 500 MG tablet Take 500 mg by mouth every 6 (six) hours as needed for moderate pain.    [name] albuterol (PROVENTIL HFA;VENTOLIN HFA) 108 (90 BASE) MCG/ACT inhaler Inhale 2 puffs into the lungs every 6 (six) hours as needed for shortness of breath.    . allopurinol (ZYLOPRIM) 100 MG tablet Take 1 tablet (100 mg total) by mouth daily. 90 tablet 1  . allopurinol (ZYLOPRIM) 300 MG tablet Take 300 mg by mouth daily.    [name] amLODipine (NORVASC) 10 MG tablet TAKE ONE TABLET BY MOUTH ONCE DAILY 90 tablet 1  . aspirin EC 81 MG tablet Take 81 mg by mouth every morning.    . Carboxymethylcellul-Glycerin (OPTIVE) 0.5-0.9 % SOLN Apply 1 drop to eye daily.    . cycloSPORINE (RESTASIS) 0.05 % ophthalmic emulsion Place 1 drop into both eyes 2 (two) times daily.    . diclofenac sodium (VOLTAREN) 1 % GEL Apply 1 application topically daily as needed (arthritis).    [name] diphenhydrAMINE (BENADRYL) 25 MG tablet Take 25 mg by mouth every 6 (six) hours as needed for allergies.    [name] FIBER FORMULA CAPS Take 1 capsule by mouth daily.     [name] gemfibrozil (LOPID) 600 MG tablet TAKE ONE-HALF TABLET BY MOUTH TWICE DAILY BEFORE MEAL(S) 90 tablet 1  . glucose blood test strip Use as instructed to test blood sugar 1 - 2 times daily. 100 each 12  . hydrALAZINE (APRESOLINE) 50 MG tablet TAKE ONE TABLET BY MOUTH THREE TIMES DAILY 90 tablet 2  . HYDROcodone-acetaminophen (NORCO/VICODIN) 5-325 MG  per tablet Take 1 tablet by mouth every 6 (six) hours as needed for pain. 30 tablet 0  . levothyroxine (SYNTHROID, LEVOTHROID) 150  MCG tablet TAKE ONE TABLET BY MOUTH ONCE DAILY 90 tablet 1  . losartan (COZAAR) 100 MG tablet TAKE ONE TABLET BY MOUTH ONCE DAILY 90 tablet 1  . metoprolol succinate (TOPROL-XL) 25 MG 24 hr tablet TAKE ONE TABLET BY MOUTH ONCE DAILY 90 tablet 1  . metroNIDAZOLE (METROCREAM) 0.75 % cream Apply 1 application topically daily as needed (rosacia).    . multivitamin (THERAGRAN) per tablet Take 1 tablet by mouth daily.      . Omega-3 Fatty Acids (FISH OIL PO) Take 1 capsule by mouth 2 (two) times daily.    [name] omeprazole (PRILOSEC) 20 MG capsule Take 1 capsule (20 mg total) by mouth every morning. 90 capsule 1  . pravastatin (PRAVACHOL) 40 MG tablet TAKE ONE TABLET BY MOUTH ONCE DAILY 90 tablet 1  . sodium chloride (OCEAN) 0.65 % nasal spray Place 1 spray into both nostrils at bedtime as needed for congestion.     No current facility-administered medications on file prior to visit.    BP 120/76 mmHg  Pulse 60  Temp(Src) 97.6 F (36.4 C) (Oral)  Resp 18  Ht 5\' 7"  (1.702 m)  Wt 183 lb 12.8 oz (83.371 kg)  BMI 28.78 kg/m2  SpO2 97%       Objective:   Physical Exam  Constitutional: He is oriented to person, place, and time. He appears well-developed and well-nourished. No distress.  HENT:  Head: Normocephalic and atraumatic.  Cardiovascular: Normal rate and regular rhythm.   No murmur heard. Pulmonary/Chest: Effort normal and breath sounds normal. No respiratory distress. He has no wheezes. He has no rales. He exhibits no tenderness.  Musculoskeletal:  2+ bilateral LE edema  Neurological: He is alert and oriented to person, place, and time.  Skin: Skin is warm and dry.  Psychiatric: He has a normal mood and affect. His behavior is normal. Judgment and thought content normal.          Assessment & Plan:

## 2014-09-18 NOTE — Assessment & Plan Note (Signed)
Advised pt- probably best to continue low dose ppi given hx of strictures.

## 2014-09-18 NOTE — Assessment & Plan Note (Signed)
>>  ASSESSMENT AND PLAN FOR BENIGN ESSENTIAL HYPERTENSION WRITTEN ON 09/18/2014  3:58 PM BY O'SULLIVAN, Khayri Kargbo, NP  Stable on current meds, continue same.

## 2014-09-21 ENCOUNTER — Other Ambulatory Visit (INDEPENDENT_AMBULATORY_CARE_PROVIDER_SITE_OTHER): Payer: Medicare Other

## 2014-09-21 DIAGNOSIS — R739 Hyperglycemia, unspecified: Secondary | ICD-10-CM

## 2014-09-21 DIAGNOSIS — I10 Essential (primary) hypertension: Secondary | ICD-10-CM

## 2014-09-21 DIAGNOSIS — E785 Hyperlipidemia, unspecified: Secondary | ICD-10-CM

## 2014-09-21 LAB — LIPID PANEL
CHOL/HDL RATIO: 5
Cholesterol: 136 mg/dL (ref 0–200)
HDL: 28.5 mg/dL — ABNORMAL LOW (ref >39.00)
LDL CALC: 81 mg/dL (ref 0–99)
NonHDL: 107.5
TRIGLYCERIDES: 135 mg/dL (ref 0.0–149.0)
VLDL: 27 mg/dL (ref 0.0–40.0)

## 2014-09-21 LAB — BASIC METABOLIC PANEL
BUN: 20 mg/dL (ref 6–23)
CHLORIDE: 106 meq/L (ref 96–112)
CO2: 27 meq/L (ref 19–32)
Calcium: 9.4 mg/dL (ref 8.4–10.5)
Creatinine, Ser: 1.2 mg/dL (ref 0.4–1.5)
GFR: 63.41 mL/min (ref >60.00)
GLUCOSE: 127 mg/dL — AB (ref 70–99)
Potassium: 4.1 mEq/L (ref 3.5–5.1)
Sodium: 142 mEq/L (ref 135–145)

## 2014-09-21 LAB — HEMOGLOBIN A1C: Hgb A1c MFr Bld: 5.9 % (ref 4.6–6.5)

## 2014-09-22 ENCOUNTER — Encounter: Payer: Self-pay | Admitting: Family

## 2014-10-06 ENCOUNTER — Encounter: Payer: Self-pay | Admitting: Neurology

## 2014-10-20 ENCOUNTER — Other Ambulatory Visit: Payer: Self-pay | Admitting: Family

## 2014-10-22 ENCOUNTER — Telehealth: Payer: Self-pay | Admitting: Neurology

## 2014-10-22 NOTE — Telephone Encounter (Signed)
Pt was in for a visit on 09/16/2014 with Megan.  During this visit his download revealed an elevated AHI of < 14.  An order was then submitted on this date for patient to be placed on auto titration 5-12.  He calls today because he wants to know what this download shows and what the next step in treatment will be.  Download was scanned and routed on 10/15/2014.  Please review and advise for pt.

## 2014-10-27 NOTE — Telephone Encounter (Signed)
No, stay on autotitration. CD

## 2014-10-27 NOTE — Telephone Encounter (Signed)
Dr. Brett Fairy,  His last AHI was 14.4.. It did improve on autotitration to 8.8. Would you like to change his settings?

## 2014-10-28 ENCOUNTER — Other Ambulatory Visit: Payer: Self-pay | Admitting: Neurology

## 2014-10-28 DIAGNOSIS — G4733 Obstructive sleep apnea (adult) (pediatric): Secondary | ICD-10-CM

## 2014-10-28 NOTE — Telephone Encounter (Signed)
Order was placed per Dr. Brett Fairy to increase patient's Auto CPAP setttings to 5-15 cm due to a residual AHI of 8.8.  Patient was notified of increase in pressure via a VM and [facility] [place] was informed to contact patient to have his CPAP pressures adjusted.  DP

## 2014-11-16 ENCOUNTER — Other Ambulatory Visit: Payer: Self-pay | Admitting: Family

## 2014-11-23 ENCOUNTER — Encounter: Payer: Self-pay | Admitting: Neurology

## 2014-12-04 ENCOUNTER — Other Ambulatory Visit: Payer: Self-pay | Admitting: Family

## 2014-12-13 ENCOUNTER — Other Ambulatory Visit: Payer: Self-pay | Admitting: Family

## 2014-12-16 ENCOUNTER — Other Ambulatory Visit: Payer: Self-pay | Admitting: Family

## 2014-12-18 ENCOUNTER — Ambulatory Visit (INDEPENDENT_AMBULATORY_CARE_PROVIDER_SITE_OTHER): Payer: Medicare Other | Admitting: Family

## 2014-12-18 ENCOUNTER — Encounter: Payer: Self-pay | Admitting: Family

## 2014-12-18 VITALS — BP 138/70 | HR 63 | Temp 98.7°F | Resp 16 | Ht 67.0 in | Wt 183.8 lb

## 2014-12-18 DIAGNOSIS — E785 Hyperlipidemia, unspecified: Secondary | ICD-10-CM | POA: Diagnosis not present

## 2014-12-18 DIAGNOSIS — E039 Hypothyroidism, unspecified: Secondary | ICD-10-CM | POA: Diagnosis not present

## 2014-12-18 DIAGNOSIS — I10 Essential (primary) hypertension: Secondary | ICD-10-CM

## 2014-12-18 LAB — BASIC METABOLIC PANEL
BUN: 24 mg/dL — ABNORMAL HIGH (ref 6–23)
CALCIUM: 9.8 mg/dL (ref 8.4–10.5)
CO2: 27 meq/L (ref 19–32)
Chloride: 107 mEq/L (ref 96–112)
Creatinine, Ser: 1.17 mg/dL (ref 0.40–1.50)
GFR: 64.62 mL/min (ref >60.00)
Glucose, Bld: 135 mg/dL — ABNORMAL HIGH (ref 70–99)
POTASSIUM: 4.6 meq/L (ref 3.5–5.1)
SODIUM: 142 meq/L (ref 135–145)

## 2014-12-18 LAB — TSH: TSH: 1.39 u[IU]/mL (ref 0.35–4.50)

## 2014-12-18 MED ORDER — LEVOTHYROXINE SODIUM 150 MCG PO TABS
150.0000 ug | ORAL_TABLET | Freq: Every day | ORAL | Status: DC
Start: 2014-12-18 — End: 2015-06-30

## 2014-12-18 MED ORDER — PRAVASTATIN SODIUM 40 MG PO TABS: 40.0000 mg | ORAL_TABLET | Freq: Every day | ORAL | Status: DC

## 2014-12-18 MED ORDER — DICLOFENAC SODIUM 1 % TD GEL: 1.0000 "application " | Freq: Every day | TRANSDERMAL | Status: DC | PRN

## 2014-12-18 MED ORDER — GEMFIBROZIL 600 MG PO TABS: ORAL_TABLET | ORAL | Status: DC

## 2014-12-18 MED ORDER — OMEPRAZOLE 20 MG PO CPDR: 20.0000 mg | DELAYED_RELEASE_CAPSULE | Freq: Every morning | ORAL | Status: DC

## 2014-12-18 MED ORDER — HYDRALAZINE HCL 50 MG PO TABS: 50.0000 mg | ORAL_TABLET | Freq: Three times a day (TID) | ORAL | Status: DC

## 2014-12-18 MED ORDER — AMLODIPINE BESYLATE 10 MG PO TABS: 10.0000 mg | ORAL_TABLET | Freq: Every day | ORAL | Status: DC

## 2014-12-18 MED ORDER — ALLOPURINOL 100 MG PO TABS: 100.0000 mg | ORAL_TABLET | Freq: Every day | ORAL | Status: DC

## 2014-12-18 MED ORDER — METOPROLOL SUCCINATE ER 25 MG PO TB24: 25.0000 mg | ORAL_TABLET | Freq: Every day | ORAL | Status: DC

## 2014-12-18 MED ORDER — LOSARTAN POTASSIUM 100 MG PO TABS: ORAL_TABLET | ORAL | Status: DC

## 2014-12-18 MED ORDER — ALLOPURINOL 300 MG PO TABS: 300.0000 mg | ORAL_TABLET | Freq: Every day | ORAL | Status: DC

## 2014-12-18 NOTE — Progress Notes (Signed)
Subjective:    Patient ID: [name] Pratt, male    DOB: [birth year], 74 y.o.   MRN: [id number]  HPI  Patient is currently maintained on the following medications for blood pressure: losartan, toprol xl, hydralazine Patient reports good compliance with blood pressure medications. Patient denies chest pain,  or  mild doe, which is stable and unchanged. Has chronic LE dependet edema.  Last 3 blood pressure readings in our office are as follows: BP Readings from Last 3 Encounters:  12/18/14 138/70  09/18/14 120/76  09/16/14 180/76     Hypothyroid- notes increased fatigue. + compliance with synthroid 150.  Lab Results  Component Value Date   TSH 0.976 03/03/2014   He continues CPAP- this is being managed by neurology.    Hyperlipidemia- Patient is currently maintained on the following medication for hyperlipidemia: gemfibrozil and pravastatin Last lipid panel as follows:  Lab Results  Component Value Date   CHOL 136 09/21/2014   HDL 28.50* 09/21/2014   LDLCALC 81 09/21/2014   TRIG 135.0 09/21/2014   CHOLHDL 5 09/21/2014  Patient reports occasional mild arm/leg myalgia x 3-4 years Patient reports poor compliance with low fat/low cholesterol diet. Active but no formal exercise.  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  Hyperglycemia-  Lab Results  Component Value Date   HGBA1C 5.9 09/21/2014        Review of Systems See HPI  Past Medical History  Diagnosis Date  . Hyperlipidemia     under control  . Chest pain      Myoview in 2008 was normal.  Echo 7/09: EF 60%, normal wall motion, mild LVH, mild LAE.  . Inguinal hernia   . Pancreatitis   . Rosacea   . Gout   . Arthritis   . History of chicken pox   . GERD (gastroesophageal reflux disease)   . History of hemorrhoids   . OSA (obstructive sleep apnea)   . Carotid stenosis     a. Carotid U/S 9/50: LICA < 93%, RICA 26-71%;  b.  Carotid U/S 2/45:  RICA 80-99%; LICA  8-33%; f/u 1 year  . Hypertension     under control  . COPD (chronic obstructive pulmonary disease)   . Hypothyroidism   . History of kidney stones   . Cancer     skin cancer  . Complication of anesthesia     "stomach does not wake up"  . Ileus   . PONV (postoperative nausea and vomiting)     History   Social History  . Marital Status: Married    Spouse Name: N/A  . Number of Children: 1  . 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 Smoking status: Former Smoker -- 11 years    Types: Cigarettes    Quit date: 09/11/1978  . Smokeless tobacco: Never Used  . Alcohol Use: Yes     Comment: rare  . Drug Use: No  . Sexual Activity: Not on file   Other Topics Concern  . Not on file   Social History Narrative   Lives with his wife   He has one son- lives locally.   2 grandchildren   Has worked for fire department   Part time pain business   Enjoys wood working   Completed HS.  [employer]    Past Surgical History  Procedure  Laterality Date  . Laparoscopic cholecystectomy  09/20/06    with intraoperative cholangiogram and right inguinal herniorrhaphy with mesh   . Hiatal hernia repair  12/21/93  . Knee surgery  1995  . Hemorrhoid surgery  2014  . Lithotripsy      x2  . Abdominal hernia repair  07/15/12    Dr [name]  . Nasal septum surgery  1975  . Esophageal dilation  1993 and 1994    multiple times  . Brow lift  05/07/01  . Urethral dilation  1991    [name]  . Epidural injections      multiple procedures  . 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 Ligament repair Left 07/2013    shoulder  . Skin cancer destruction      nose, ear  . Inguinal hernia repair Right 07/15/12    Dr [name], x2  . Joint replacement  07/25/04    right knee  . Joint replacement  07/13/10    left knee  . Total hip arthroplasty Left   . Eye surgery Bilateral 03/25/10, 2012    cataract removal  . Refractive  surgery Bilateral   . Tonsillectomy  as child  . Cystoscopy with ureteroscopy and stent placement Right 01/28/2014    Procedure: CYSTOSCOPY WITH URETEROSCOPY, BASKET RETRIVAL AND  STENT PLACEMENT;  Surgeon: [name], MD;  Location: [facility];  Service: Urology;  Laterality: Right;  . Holmium laser application Right 3/41/9622    Procedure: HOLMIUM LASER APPLICATION;  Surgeon: [name], MD;  Location: [facility];  Service: Urology;  Laterality: Right;    Family History  Problem Relation Age of Onset  . Hypertension    . Cancer    . Osteoarthritis      Allergies  Allergen Reactions  . Metoclopramide Hcl     anxiety    Current Outpatient Prescriptions on File Prior to Visit  Medication Sig Dispense Refill  . acetaminophen (TYLENOL) 500 MG tablet Take 500 mg by mouth every 6 (six) hours as needed for moderate pain.    [name] albuterol (PROVENTIL HFA;VENTOLIN HFA) 108 (90 BASE) MCG/ACT inhaler Inhale 2 puffs into the lungs every 6 (six) hours as needed for shortness of breath.    . allopurinol (ZYLOPRIM) 300 MG tablet TAKE ONE TABLET BY MOUTH ONCE DAILY 90 tablet 0  . aspirin EC 81 MG tablet Take 81 mg by mouth every morning.    . Carboxymethylcellul-Glycerin (OPTIVE) 0.5-0.9 % SOLN Apply 1 drop to eye daily.    . cycloSPORINE (RESTASIS) 0.05 % ophthalmic emulsion Place 1 drop into both eyes 2 (two) times daily.    . diphenhydrAMINE (BENADRYL) 25 MG tablet Take 25 mg by mouth every 6 (six) hours as needed for allergies.    [name] FIBER FORMULA CAPS Take 1 capsule by mouth daily.     [name] glucose blood test strip Use as instructed to test blood sugar 1 - 2 times daily. 100 each 12  . HYDROcodone-acetaminophen (NORCO/VICODIN) 5-325 MG per tablet Take 1 tablet by mouth every 6 (six) hours as needed for pain. 30 tablet 0  . metroNIDAZOLE (METROCREAM) 0.75 % cream Apply 1 application topically daily as needed (rosacia).    . multivitamin (THERAGRAN) per tablet Take 1 tablet by mouth daily.      .  Omega-3 Fatty Acids (FISH OIL PO) Take 1 capsule by mouth 2 (two) times daily.    . sodium chloride (OCEAN) 0.65 % nasal spray Place 1 spray into  both nostrils at bedtime as needed for congestion.     No current facility-administered medications on file prior to visit.    BP 138/70 mmHg  Pulse 63  Temp(Src) 98.7 F (37.1 C) (Oral)  Resp 16  Ht 5\' 7"  (1.702 m)  Wt 183 lb 12.8 oz (83.371 kg)  BMI 28.78 kg/m2  SpO2 95%       Objective:   Physical Exam  Constitutional: He is oriented to person, place, and time. He appears well-developed and well-nourished. No distress.  HENT:  Head: Normocephalic and atraumatic.  Cardiovascular: Normal rate and regular rhythm.   No murmur heard. Pulmonary/Chest: Effort normal and breath sounds normal. No respiratory distress. He has no wheezes. He has no rales.  Musculoskeletal: He exhibits no edema.  Neurological: He is alert and oriented to person, place, and time.  Skin: Skin is warm and dry.  Psychiatric: He has a normal mood and affect. His behavior is normal. Thought content normal.          Assessment & Plan:

## 2014-12-18 NOTE — Progress Notes (Signed)
Pre visit review using our clinic review tool, if applicable. No additional management support is needed unless otherwise documented below in the visit note. 

## 2014-12-18 NOTE — Patient Instructions (Signed)
Please complete lab work prior to leaving. Follow up in 3 months.  

## 2014-12-21 ENCOUNTER — Telehealth: Payer: Self-pay | Admitting: *Deleted

## 2014-12-21 NOTE — Telephone Encounter (Signed)
Prior auth for voltaren gel (diclofenac sodium gel) initiated. Awaiting determination. JG//CMA

## 2014-12-21 NOTE — Assessment & Plan Note (Signed)
Discussed raising hdl through exercise.

## 2014-12-21 NOTE — Assessment & Plan Note (Signed)
Lab Results  Component Value Date   TSH 1.39 12/18/2014   Follow up TSH normal, continue current dose of synthoid.

## 2014-12-21 NOTE — Assessment & Plan Note (Signed)
>>  ASSESSMENT AND PLAN FOR BENIGN ESSENTIAL HYPERTENSION WRITTEN ON 12/21/2014  8:10 AM BY O'SULLIVAN, Jaicion Laurie, NP  BP is stable on current meds, continue same.

## 2014-12-21 NOTE — Assessment & Plan Note (Signed)
BP is stable on current meds, continue same.  

## 2014-12-29 ENCOUNTER — Telehealth: Payer: Self-pay | Admitting: Neurology

## 2014-12-29 ENCOUNTER — Telehealth: Payer: Self-pay | Admitting: Adult Health

## 2014-12-29 NOTE — Telephone Encounter (Signed)
Please call his DME company and see what they need for me to order. thanks

## 2014-12-29 NOTE — Telephone Encounter (Signed)
I spoke with Girard Cooter and he wanted to make sure the settings on his CPAP do not need to be changed. He recently tried AutoPAP at home and is now back on his regular CPAP machine. I told him that I will contact Webb Silversmith with [facility] about any pending orders.

## 2014-12-29 NOTE — Telephone Encounter (Signed)
Pt called office concerned about pressure changes on his CPAP machine. Pt states he spoke with the supply company and they state they are waiting on orders in order to fix the issue the pt is having with his machine. Please advise.

## 2014-12-29 NOTE — Telephone Encounter (Signed)
I am forwarding this to Dr. Rexene Alberts. Cpap issues should go to [facility], not [facility]. Thank you

## 2014-12-29 NOTE — Telephone Encounter (Signed)
Message made in error

## 2014-12-29 NOTE — Telephone Encounter (Signed)
Patient called wanting to speak with Jinny Blossom, NP assistant regarding his CPAP

## 2015-01-01 NOTE — Telephone Encounter (Signed)
PA approved through 09/10/2038. JG//CMA

## 2015-01-06 NOTE — Telephone Encounter (Signed)
I sent a message to Dr. Brett Fairy, last week, to check on this study and let patient know if any settings need to be changed.

## 2015-01-07 ENCOUNTER — Telehealth: Payer: Self-pay

## 2015-01-07 DIAGNOSIS — G4733 Obstructive sleep apnea (adult) (pediatric): Secondary | ICD-10-CM

## 2015-01-07 DIAGNOSIS — Z9989 Dependence on other enabling machines and devices: Principal | ICD-10-CM

## 2015-01-07 NOTE — Telephone Encounter (Signed)
Called pt back after discussing with Dr. Brett Fairy. She recommended increasing max pressure to 16 cm H2O. Pt aware. Encouraged pt to call back with any questions or concerns.

## 2015-01-12 ENCOUNTER — Telehealth: Payer: Self-pay | Admitting: Family

## 2015-01-12 MED ORDER — GLUCOSE BLOOD VI STRP: ORAL_STRIP | Status: DC

## 2015-01-12 NOTE — Telephone Encounter (Signed)
Refill sent.

## 2015-01-12 NOTE — Telephone Encounter (Signed)
Relation to pt: self  Call back number: [phone] Pharmacy: [facility] [phone]  Reason for call:  Pt requesting freestyle lite blood glucose test strip please send to  [facility] [place] [place] [phone] (Phone) [phone] (Fax)

## 2015-01-19 ENCOUNTER — Telehealth: Payer: Self-pay | Admitting: Neurology

## 2015-01-19 DIAGNOSIS — G4733 Obstructive sleep apnea (adult) (pediatric): Secondary | ICD-10-CM

## 2015-01-19 NOTE — Telephone Encounter (Signed)
Spoke to [place] at [facility]. She said she would fax [place] a latest download so Dr. Brett Fairy can review it and suggest changes.

## 2015-01-19 NOTE — Telephone Encounter (Signed)
Pt called office wanting to follow up on status of cpap download being sent to our office since his pressure setting change. Pt states he has two recent pressure changes and has not received an update since he last turned the machine back in to [facility]. Please call pt once request is reviewed.

## 2015-01-20 NOTE — Telephone Encounter (Signed)
Spoke to pt and informed him that Dr. Brett Fairy gave a new order for him and it was sent to [facility]. He said [facility] had already contacted him and he thanked [place] for our time.

## 2015-01-20 NOTE — Telephone Encounter (Signed)
Called pt to inform him that Dr. Brett Fairy looked at his march cpap download and recommended an auto bipap and the order was sent to [facility]. No answer, left a message on machine.

## 2015-01-20 NOTE — Telephone Encounter (Signed)
Patient called/returning Kirsten's call regarding results. Please call and advise. Patient can be reached @ [phone]

## 2015-02-01 ENCOUNTER — Telehealth: Payer: Self-pay

## 2015-02-01 NOTE — Telephone Encounter (Signed)
Called pt to schedule an appt with Dr. Brett Fairy per insurance requirements and [facility] need. Pt is having problems with his cpap such as leaking, "blowing off my face". Dr. Brett Fairy had recommended bipap instread of cpap due to these issues but [facility] and insurance need documentation of failed cpap therapy. Pt agreeable to coming in again because of all the problems he is having. Appt made for 6/9.

## 2015-02-17 ENCOUNTER — Encounter: Payer: Self-pay | Admitting: Neurology

## 2015-02-17 ENCOUNTER — Ambulatory Visit (INDEPENDENT_AMBULATORY_CARE_PROVIDER_SITE_OTHER): Payer: Medicare Other | Admitting: Cardiovascular Disease

## 2015-02-17 ENCOUNTER — Telehealth: Payer: Self-pay | Admitting: Cardiovascular Disease

## 2015-02-17 ENCOUNTER — Encounter: Payer: Self-pay | Admitting: Cardiovascular Disease

## 2015-02-17 VITALS — BP 148/62 | HR 51 | Ht 67.0 in | Wt 178.1 lb

## 2015-02-17 DIAGNOSIS — I10 Essential (primary) hypertension: Secondary | ICD-10-CM | POA: Diagnosis not present

## 2015-02-17 DIAGNOSIS — I6523 Occlusion and stenosis of bilateral carotid arteries: Secondary | ICD-10-CM | POA: Diagnosis not present

## 2015-02-17 MED ORDER — LOSARTAN POTASSIUM-HCTZ 100-25 MG PO TABS: 1.0000 | ORAL_TABLET | Freq: Every day | ORAL | Status: DC

## 2015-02-17 NOTE — Patient Instructions (Addendum)
Medication Instructions:  STOP  AMLODIPINE   START  LOSARTAN   HYDROCHLOROTHIAZIDE 100/25 MG  EVERY DAY   Labwork: BMET IN   3 WEEKS   Testing/Procedures: Your physician has requested that you have a carotid duplex. This test is an ultrasound of the carotid arteries in your neck. It looks at blood flow through these arteries that supply the brain with blood. Allow one hour for this exam. There are no restrictions or special instructions.   Follow-Up:Your physician recommends that you schedule a follow-up appointment in: NEXT AVAILABLE WITH DR Johnsie Cancel Any Other Special Instructions Will Be Listed Below (If Applicable).

## 2015-02-17 NOTE — Telephone Encounter (Signed)
New message       Pt saw Dr Johnsie Cancel today at 3pm.  He has a question regarding the directions on taking losartan

## 2015-02-17 NOTE — Progress Notes (Signed)
Patient ID: [name] Pratt, male   DOB: 08/18/1941, 74 y.o.   MRN: [id number] Hilda returns today for F/U of SSCP, HTN, and increased lipids. He has no documented CAD with a normal myovue 3.14.2008. EF 62%. Echo 03/30/2008 done for dyspnea showed EF 60% with AV sclerosis and mild LAE. 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 He has been compliant with his BP meds and only takes gemfibrizol for triglycerides. His labs are followed at Dr [facility] . He has significant arthritis and sees Dr. Delrae Sawyers for hip and back issues. These are relieved by Aleve. Has some exertional dsypnea but no PND orthopnea or edema. No cough sputum or wheezing.    Lab Results  Component Value Date   LDLCALC 81 09/21/2014   Carotid 02/11/14:  40-59% bilateral ICA disease needs f/u   Last couple of months has had LE edema worsening    ROS: Denies fever, malais, weight loss, blurry vision, decreased visual acuity, cough, sputum, SOB, hemoptysis, pleuritic pain, palpitaitons, heartburn, abdominal pain, melena, lower extremity edema, claudication, or rash.  All other systems reviewed and negative  General: Affect appropriate Healthy:  appears stated age 27: normal Neck supple with no adenopathy JVP normal right  bruits no thyromegaly Lungs clear with no wheezing and good diaphragmatic motion Heart:  S1/S2 no murmur, no rub, gallop or click PMI normal Abdomen: benighn, BS positve, no tenderness, no AAA no bruit.  No HSM or HJR Distal pulses intact with no bruits Plus one bilateral edema Neuro non-focal Skin warm and dry No muscular weakness   Current Outpatient Prescriptions  Medication Sig Dispense Refill  . acetaminophen (TYLENOL) 500 MG tablet Take 500 mg by mouth every 6 (six) hours as needed for moderate pain.    [name] albuterol (PROVENTIL HFA;VENTOLIN HFA) 108 (90 BASE) MCG/ACT inhaler Inhale 2 puffs into the lungs every 6 (six) hours as needed for shortness of breath.    . allopurinol (ZYLOPRIM) 100 MG tablet Take 1  tablet (100 mg total) by mouth daily. 90 tablet 1  . allopurinol (ZYLOPRIM) 300 MG tablet TAKE ONE TABLET BY MOUTH ONCE DAILY 90 tablet 0  . amLODipine (NORVASC) 10 MG tablet Take 1 tablet (10 mg total) by mouth daily. 90 tablet 1  . aspirin EC 81 MG tablet Take 81 mg by mouth every morning.    . Carboxymethylcellul-Glycerin (OPTIVE) 0.5-0.9 % SOLN Apply 1 drop to eye daily.    . cycloSPORINE (RESTASIS) 0.05 % ophthalmic emulsion Place 1 drop into both eyes 2 (two) times daily.    . diclofenac sodium (VOLTAREN) 1 % GEL Apply 1 application topically daily as needed (arthritis). 100 g 0  . diphenhydrAMINE (BENADRYL) 25 MG tablet Take 25 mg by mouth every 6 (six) hours as needed for allergies.    [name] FIBER FORMULA CAPS Take 1 capsule by mouth daily.     [name] gemfibrozil (LOPID) 600 MG tablet TAKE ONE-HALF TABLET BY MOUTH TWICE DAILY BEFORE MEAL(S) 90 tablet 1  . glucose blood test strip Use as instructed to test blood sugar 1 - 2 times daily. 100 each 1  . hydrALAZINE (APRESOLINE) 50 MG tablet Take 1 tablet (50 mg total) by mouth 3 (three) times daily. 270 tablet 1  . HYDROcodone-acetaminophen (NORCO/VICODIN) 5-325 MG per tablet Take 1 tablet by mouth every 6 (six) hours as needed for pain. 30 tablet 0  . IRON PO Take 50 mcg by mouth daily.    [name] levothyroxine (SYNTHROID, LEVOTHROID) 150 MCG tablet Take 1 tablet (150  mcg total) by mouth daily. 90 tablet 1  . losartan (COZAAR) 100 MG tablet TAKE ONE TABLET BY MOUTH ONCE DAILY 90 tablet 1  . metoprolol succinate (TOPROL-XL) 25 MG 24 hr tablet Take 1 tablet (25 mg total) by mouth daily. 90 tablet 1  . metroNIDAZOLE (METROCREAM) 0.75 % cream Apply 1 application topically daily as needed (rosacia).    . multivitamin (THERAGRAN) per tablet Take 1 tablet by mouth daily.      . Omega-3 Fatty Acids (FISH OIL PO) Take 1 capsule by mouth 2 (two) times daily.    [name] omeprazole (PRILOSEC) 20 MG capsule Take 1 capsule (20 mg total) by mouth every morning. 90 capsule 1   . pravastatin (PRAVACHOL) 40 MG tablet Take 1 tablet (40 mg total) by mouth daily. 90 tablet 1  . sodium chloride (OCEAN) 0.65 % nasal spray Place 1 spray into both nostrils at bedtime as needed for congestion.     No current facility-administered medications for this visit.    Allergies  Metoclopramide hcl  Electrocardiogram:  5/15  SR LAD normal otherwise   02/17/15  SR rate 51  RBBB LAFB  Assessment and Plan  Bruit: no change f/u duplex  ASA  HTN:  LE edema stop amlodipine start diuretic BMET 3 weeks f/u me next available  Chol:  Cholesterol is at goal.  Continue current dose of statin and diet Rx.  No myalgias or side effects.  F/U  LFT's in 6 months. Lab Results  Component Value Date   LDLCALC 81 09/21/2014            Jenkins Rouge

## 2015-02-17 NOTE — Telephone Encounter (Signed)
PT  AWARE  NEEDS  STOP  LOSARTAN  AND  START  LOSARTAN  HCTZ  100/25 MG  ALSO  AMLODIPINE WAS  STOPPED .Adonis Housekeeper

## 2015-02-18 ENCOUNTER — Encounter: Payer: Self-pay | Admitting: Neurology

## 2015-02-18 ENCOUNTER — Telehealth: Payer: Self-pay | Admitting: Cardiovascular Disease

## 2015-02-18 ENCOUNTER — Ambulatory Visit (INDEPENDENT_AMBULATORY_CARE_PROVIDER_SITE_OTHER): Payer: Medicare Other | Admitting: Neurology

## 2015-02-18 VITALS — BP 160/62 | HR 64 | Resp 20 | Ht 66.14 in | Wt 178.0 lb

## 2015-02-18 DIAGNOSIS — Z9989 Dependence on other enabling machines and devices: Secondary | ICD-10-CM

## 2015-02-18 DIAGNOSIS — G4733 Obstructive sleep apnea (adult) (pediatric): Secondary | ICD-10-CM | POA: Diagnosis not present

## 2015-02-18 DIAGNOSIS — G2581 Restless legs syndrome: Secondary | ICD-10-CM | POA: Diagnosis not present

## 2015-02-18 DIAGNOSIS — J342 Deviated nasal septum: Secondary | ICD-10-CM | POA: Insufficient documentation

## 2015-02-18 DIAGNOSIS — J3089 Other allergic rhinitis: Secondary | ICD-10-CM

## 2015-02-18 HISTORY — DX: Restless legs syndrome: G25.81

## 2015-02-18 HISTORY — DX: Other allergic rhinitis: J30.89

## 2015-02-18 HISTORY — DX: Deviated nasal septum: J34.2

## 2015-02-18 NOTE — Telephone Encounter (Signed)
New message      Pt was seen yesterday and he is confused about his medications dosage/directions

## 2015-02-18 NOTE — Telephone Encounter (Signed)
I spoke with the patient and clarified medication changes from yesterday- he should stop amlodipine, stop plain losartan, and start losartan/hctz 10/25 mg daily. He was concerned her should stop hydralazine. I advised that hydralazine is different from hctz and that he should continue hydralazine. He verbalizes understanding.

## 2015-02-18 NOTE — Progress Notes (Signed)
PATIENT: [name] Pratt DOB: [birth year]  REASON FOR VISIT: follow up HISTORY FROM: patient CHIEF COMPLAINT: OSA on CPAP and daytime sleepiness  HISTORY OF PRESENT ILLNESS: [name] Pratt is a 74 year old male with history of obstructive sleep Apnea and highly restrcited nasal airflow.  [name] He returns today for a 30 daydownload.  At the last visit his AHI was 13.8 , now 13.0 on his pressure setting at  9 cm water .  He brought his machine with him today but we are able to get a recent download.  the patient assures me that his DME company changed his settings and he has been using his machine nightly.  Overall he doesn't notice a significant change with the pressure change other than sometimes the pressure is "strong" and can blow the mask off. The patient states that he feels that he is getting good sleep. If he sits down in front of a computer or tv after getting up he may tend to doze off. He still notices some daytime sleepiness. He gets about 7 hours of sleep. He does feel that the CPAP has helped.   ADDEDUM: We were able to get a download from the patient's DME: it showed an AHI of 14.4 on 9 cm H2O with EPR 2. His compliance is 90%. No leak noted. He uses his machine on average 6 hours and 40 minutes.   HISTORY 07/15/13 (CD): 73 y.o. male Is seen here as a revisit from Dr. Inda Castle for for follow up on CPAP compliance. This patient had been followed as was the year 2007, 8 and 9. He was placed on VPAP originally, but developed in 2009 more sleepiness and interrupted sleep. A repeat sleep studies still documented mostly central apnea, but he was re- titrated and responded well to CPAP at 7 cm of water.His last yearly CPAP revisit was on 07-18-12. reported he had no longer insomnia- his Epworth sleepiness score at the time was 7 points his fatigue score 18 points but he presented with red blotchy spots at the bridge of his nose and under both eyes. He needed caffeine in the day but only  restricted himself to in the morning. He reportedly slept about 8 hours but not en bloc. And download in 2013 children average daytime and therapy of 5 hours and 17 minutes, residual AHI of 7.3 that pressure at 7 cm water with 2 cm EPR. The patient used the machine 73/78 days. He will week spontaneously without the help of an alarm. He will drink caffeinated beverages but only in the morning. He will have one bathroom break each night The patient underwent a hip replacement in April 2014, has recovered well. He still has the blotchy reddening of his eyes but reported he has dry eyes and uses a lot of her spaces.  Today's fatigue severity score is 22 points and his Epworth sleepiness score is 12 points the geriatric depression score is endorsed at 2 points. His sleep habits remaining as an established before. His bedtime is around 11 PM he falls asleep within 5 minutes or less. He sleeps for about 4 hours then he wakes up every hour after that, he attributes this arousals to pain shoulder or back pain joint pain. He has osteoarthritis. He finally rises in the morning around 8 AM. Have one bathroom break at night. Laughingly reports that his wife's herself smokes love to hear him. He is not aware of still having any breakthrough snoring or apneas.  His  brother died in 0569 as complications of alcoholism, cancer of the liver and bones  REVIEW OF SYSTEMS: Out of a complete 14 system review of symptoms, the patient complains only of the following symptoms, and all other reviewed systems are negative.  Fatigue Hearing loss Restless leg Daytime sleepiness  Itching Urgency Bruise/bleed easily   ALLERGIES: Allergies  Allergen Reactions  . Metoclopramide Hcl     anxiety    HOME MEDICATIONS: Outpatient Prescriptions Prior to Visit  Medication Sig Dispense Refill  . acetaminophen (TYLENOL) 500 MG tablet Take 500 mg by mouth every 6 (six) hours as needed for moderate pain.    [name] albuterol  (PROVENTIL HFA;VENTOLIN HFA) 108 (90 BASE) MCG/ACT inhaler Inhale 2 puffs into the lungs every 6 (six) hours as needed for shortness of breath.    . allopurinol (ZYLOPRIM) 100 MG tablet Take 1 tablet (100 mg total) by mouth daily. 90 tablet 1  . allopurinol (ZYLOPRIM) 300 MG tablet TAKE ONE TABLET BY MOUTH ONCE DAILY 90 tablet 0  . aspirin EC 81 MG tablet Take 81 mg by mouth every morning.    . Carboxymethylcellul-Glycerin (OPTIVE) 0.5-0.9 % SOLN Apply 1 drop to eye daily.    . cycloSPORINE (RESTASIS) 0.05 % ophthalmic emulsion Place 1 drop into both eyes 2 (two) times daily.    . diclofenac sodium (VOLTAREN) 1 % GEL Apply 1 application topically daily as needed (arthritis). 100 g 0  . diphenhydrAMINE (BENADRYL) 25 MG tablet Take 25 mg by mouth every 6 (six) hours as needed for allergies.    [name] FIBER FORMULA CAPS Take 1 capsule by mouth daily.     [name] gemfibrozil (LOPID) 600 MG tablet TAKE ONE-HALF TABLET BY MOUTH TWICE DAILY BEFORE MEAL(S) 90 tablet 1  . glucose blood test strip Use as instructed to test blood sugar 1 - 2 times daily. 100 each 1  . hydrALAZINE (APRESOLINE) 50 MG tablet Take 1 tablet (50 mg total) by mouth 3 (three) times daily. 270 tablet 1  . HYDROcodone-acetaminophen (NORCO/VICODIN) 5-325 MG per tablet Take 1 tablet by mouth every 6 (six) hours as needed for pain. 30 tablet 0  . IRON PO Take 50 mcg by mouth daily.    [name] levothyroxine (SYNTHROID, LEVOTHROID) 150 MCG tablet Take 1 tablet (150 mcg total) by mouth daily. 90 tablet 1  . losartan-hydrochlorothiazide (HYZAAR) 100-25 MG per tablet Take 1 tablet by mouth daily. 30 tablet 11  . metoprolol succinate (TOPROL-XL) 25 MG 24 hr tablet Take 1 tablet (25 mg total) by mouth daily. 90 tablet 1  . metroNIDAZOLE (METROCREAM) 0.75 % cream Apply 1 application topically daily as needed (rosacia).    . multivitamin (THERAGRAN) per tablet Take 1 tablet by mouth daily.      . Omega-3 Fatty Acids (FISH OIL PO) Take 1 capsule by mouth 2 (two)  times daily.    [name] omeprazole (PRILOSEC) 20 MG capsule Take 1 capsule (20 mg total) by mouth every morning. 90 capsule 1  . pravastatin (PRAVACHOL) 40 MG tablet Take 1 tablet (40 mg total) by mouth daily. 90 tablet 1  . sodium chloride (OCEAN) 0.65 % nasal spray Place 1 spray into both nostrils at bedtime as needed for congestion.     No facility-administered medications prior to visit.    PAST MEDICAL HISTORY: Past Medical History  Diagnosis Date  . Hyperlipidemia     under control  . Chest pain      Myoview in 2008 was normal.  Echo 7/09: EF  60%, normal wall motion, mild LVH, mild LAE.  . Inguinal hernia   . Pancreatitis   . Rosacea   . Gout   . Arthritis   . History of chicken pox   . GERD (gastroesophageal reflux disease)   . History of hemorrhoids   . OSA (obstructive sleep apnea)   . Carotid stenosis     a. Carotid U/S 2/42: LICA < 35%, RICA 36-14%;  b.  Carotid U/S 4/31:  RICA 54-00%; LICA 8-67%; f/u 1 year  . Hypertension     under control  . COPD (chronic obstructive pulmonary disease)   . Hypothyroidism   . History of kidney stones   . Cancer     skin cancer  . Complication of anesthesia     "stomach does not wake up"  . Ileus   . PONV (postoperative nausea and vomiting)     PAST SURGICAL HISTORY: Past Surgical History  Procedure Laterality Date  . Laparoscopic cholecystectomy  09/20/06    with intraoperative cholangiogram and right inguinal herniorrhaphy with mesh   . Hiatal hernia repair  12/21/93  . Knee surgery  1995  . Hemorrhoid surgery  2014  . Lithotripsy      x2  . Abdominal hernia repair  07/15/12    Dr [name]  . Nasal septum surgery  1975  . Esophageal dilation  1993 and 1994    multiple times  . Brow lift  05/07/01  . Urethral dilation  1991    [name]  . Epidural injections      multiple procedures  . 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 Ligament repair Left 07/2013    shoulder  . Skin cancer destruction      nose, ear  . Inguinal  hernia repair Right 07/15/12    Dr [name], x2  . Joint replacement  07/25/04    right knee  . Joint replacement  07/13/10    left knee  . Total hip arthroplasty Left   . Eye surgery Bilateral 03/25/10, 2012    cataract removal  . Refractive surgery Bilateral   . Tonsillectomy  as child  . Cystoscopy with ureteroscopy and stent placement Right 01/28/2014    Procedure: CYSTOSCOPY WITH URETEROSCOPY, BASKET RETRIVAL AND  STENT PLACEMENT;  Surgeon: [name], MD;  Location: [facility];  Service: Urology;  Laterality: Right;  . Holmium laser application Right 02/28/5092    Procedure: HOLMIUM LASER APPLICATION;  Surgeon: [name], MD;  Location: [facility];  Service: Urology;  Laterality: Right;    FAMILY HISTORY: Family History  Problem Relation Age of Onset  . Hypertension    . Cancer    . Osteoarthritis    . Cancer Mother     SOCIAL HISTORY: History   Social History  . Marital Status: Married    Spouse Name: N/A  . Number of Children: 1  . 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 Smoking status: Former Smoker -- 11 years    Types: Cigarettes    Quit date: 09/11/1978  . Smokeless tobacco: Never Used  . Alcohol Use: Yes     Comment: rare  . Drug Use: No  . Sexual Activity: Not on file   Other Topics Concern  . Not on file   Social History Narrative   Lives with his wife   He has one son- lives  locally.   2 grandchildren   Has worked for fire department   Part time pain business   Enjoys wood working   Completed HS.  [employer]      PHYSICAL EXAM  Filed Vitals:   02/18/15 1429  BP: 160/62  Pulse: 64  Resp: 20  Height: 5' 6.14" (1.68 m)  Weight: 178 lb (80.74 kg)   Body mass index is 28.61 kg/(m^2).  Generalized: Well developed, in no acute distress  Neck: Circumference 16.75 inches, Mallampati 2+  Neurological examination  Mentation: Alert oriented to time, place,  history taking. Follows all commands speech and language fluent Cranial nerve II-XII: Pupils were equal round reactive to light. Extraocular movements were full, visual field were full on confrontational test. Facial sensation and strength were normal. Uvula tongue midline. Head turning and shoulder shrug  were normal and symmetric. Motor: The motor testing reveals 5 over 5 strength of all 4 extremities. Good symmetric motor tone is noted throughout.  Sensory: Sensory testing is intact to soft touch on all 4 extremities. No evidence of extinction is noted.  Coordination: Cerebellar testing reveals good finger-nose-finger and heel-to-shin bilaterally.  Gait and station: Gait is normal. Tandem gait is normal. Romberg is negative. No drift is seen.  Reflexes: Deep tendon reflexes are symmetric and normal bilaterally.   DIAGNOSTIC DATA (LABS, IMAGING, TESTING) - I reviewed patient records, labs, notes, testing and imaging myself where available.  Lab Results  Component Value Date   WBC 5.9 06/03/2014   HGB 14.3 06/03/2014   HCT 42.9 06/03/2014   MCV 97.6 06/03/2014   PLT 386.0 06/03/2014      Component Value Date/Time   NA 142 12/18/2014 1141   K 4.6 12/18/2014 1141   CL 107 12/18/2014 1141   CO2 27 12/18/2014 1141   GLUCOSE 135* 12/18/2014 1141   BUN 24* 12/18/2014 1141   CREATININE 1.17 12/18/2014 1141   CREATININE 0.95 05/26/2013 1134   CALCIUM 9.8 12/18/2014 1141   PROT 7.6 03/03/2014 1014   ALBUMIN 4.6 03/03/2014 1014   AST 21 03/03/2014 1014   ALT 19 03/03/2014 1014   ALKPHOS 82 03/03/2014 1014   BILITOT 0.6 03/03/2014 1014   GFRNONAA 48* 01/17/2014 0400   GFRNONAA 73 11/25/2012 1103   GFRAA 56* 01/17/2014 0400   GFRAA 84 11/25/2012 1103   Lab Results  Component Value Date   CHOL 136 09/21/2014   HDL 28.50* 09/21/2014   LDLCALC 81 09/21/2014   TRIG 135.0 09/21/2014   CHOLHDL 5 09/21/2014   Lab Results  Component Value Date   HGBA1C 5.9 09/21/2014   No results  found for: VITAMINB12 Lab Results  Component Value Date   TSH 1.39 12/18/2014      ASSESSMENT AND PLAN 74 y.o. year old male  has a past medical history of Hyperlipidemia; Chest pain; Inguinal hernia; Pancreatitis; Rosacea; Gout; Arthritis; History of chicken pox; GERD (gastroesophageal reflux disease); History of hemorrhoids; OSA (obstructive sleep apnea); Carotid stenosis; Hypertension; COPD (chronic obstructive pulmonary disease); Hypothyroidism; History of kidney stones; Cancer; Complication of anesthesia; Ileus; and PONV (postoperative nausea and vomiting). here with:  1. OSA on CPAP, 9 cm water , FFM ond followed by [facility].  2. Daytime sleepiness ,  Epworth sleepiness score on 02-18-15 was 12 points and his fatigue severity score was 46 points. Incomplete resolution of apnea.  3. RLS controlled on quinine as needed.     Patient's downgoing still indicates a high AHI of 14.4 and now of 13 after  we reduced the CPAP pressure to 9 cm water. I am not longer optimistic  We can control his panea. He feels that CPAP makes his nasal airflow problems worse. He will not be helped by using BiAP.  I will just allow for 3 cm EPR and keep the machine at 9 cm water.   . I have consulted with Dr. Rexene Alberts and we will change the patient to  3 cm EPR, 9 cm water . He is still using a FFM and likes it. He has to use CPAP to keep oxygen desaturations at bay- he has retinal ischeamia -hypovascularity.    I will have to order sent over to his DME company. We will see the patient back in 6 months for a compliance download. Since we received a download after the patient's visit I will call the patient and make him aware of the new orders and download results.  Larey Seat, MD   02/18/2015, 2:51 PM [facility] [address] [place], [place] [address] [phone]  Note: This document was prepared with digital dictation and possible smart phrase technology. Any transcriptional errors  that result from this process are unintentional.

## 2015-03-09 ENCOUNTER — Ambulatory Visit (HOSPITAL_COMMUNITY): Payer: Medicare Other | Attending: Internal Medicine

## 2015-03-09 ENCOUNTER — Other Ambulatory Visit (INDEPENDENT_AMBULATORY_CARE_PROVIDER_SITE_OTHER): Payer: Medicare Other

## 2015-03-09 DIAGNOSIS — I6523 Occlusion and stenosis of bilateral carotid arteries: Secondary | ICD-10-CM | POA: Insufficient documentation

## 2015-03-09 DIAGNOSIS — E7800 Pure hypercholesterolemia, unspecified: Secondary | ICD-10-CM

## 2015-03-09 DIAGNOSIS — E785 Hyperlipidemia, unspecified: Secondary | ICD-10-CM

## 2015-03-09 DIAGNOSIS — I10 Essential (primary) hypertension: Secondary | ICD-10-CM

## 2015-03-09 LAB — BASIC METABOLIC PANEL
BUN: 24 mg/dL — ABNORMAL HIGH (ref 6–23)
CO2: 28 meq/L (ref 19–32)
Calcium: 9.4 mg/dL (ref 8.4–10.5)
Chloride: 105 mEq/L (ref 96–112)
Creatinine, Ser: 1.39 mg/dL (ref 0.40–1.50)
GFR: 52.94 mL/min — ABNORMAL LOW (ref >60.00)
Glucose, Bld: 148 mg/dL — ABNORMAL HIGH (ref 70–99)
POTASSIUM: 3.9 meq/L (ref 3.5–5.1)
SODIUM: 141 meq/L (ref 135–145)

## 2015-03-09 NOTE — Addendum Note (Signed)
Addended by: Stephannie Peters on: 03/09/2015 10:24 AM   Modules accepted: Orders

## 2015-03-19 ENCOUNTER — Telehealth: Payer: Self-pay | Admitting: Neurology

## 2015-03-19 NOTE — Telephone Encounter (Addendum)
I called and spoke to [name] Pratt at [facility].  She stated that pt was in and saw [name] Pratt, changes made from 02-18-15 order.  Most recent download was from 01-27-15 to 02-25-15.  She will fax to my attention.  [name] Pratt with [facility] in last Thursday.  Will check with [name] Pratt on Monday, also [name] Pratt back on Monday.  I called and LMVM for pt that will follow up on Monday.

## 2015-03-19 NOTE — Telephone Encounter (Signed)
Patient called and stated he is still waiting to hear from someone about the reading of the card for his machine.  He says no one has contacted him about what to do with his problem.  He said he saw [place] and she started the process with the problem but he doesn't have an answer on what to do next.  Please contact the patient.

## 2015-03-22 ENCOUNTER — Encounter: Payer: Self-pay | Admitting: Family

## 2015-03-22 ENCOUNTER — Ambulatory Visit (INDEPENDENT_AMBULATORY_CARE_PROVIDER_SITE_OTHER): Payer: Medicare Other | Admitting: Family

## 2015-03-22 ENCOUNTER — Telehealth: Payer: Self-pay | Admitting: Neurology

## 2015-03-22 VITALS — BP 130/70 | HR 64 | Temp 97.6°F | Resp 16 | Ht 66.0 in | Wt 174.8 lb

## 2015-03-22 DIAGNOSIS — I10 Essential (primary) hypertension: Secondary | ICD-10-CM

## 2015-03-22 DIAGNOSIS — T148 Other injury of unspecified body region: Secondary | ICD-10-CM

## 2015-03-22 DIAGNOSIS — R739 Hyperglycemia, unspecified: Secondary | ICD-10-CM | POA: Diagnosis not present

## 2015-03-22 DIAGNOSIS — E785 Hyperlipidemia, unspecified: Secondary | ICD-10-CM

## 2015-03-22 DIAGNOSIS — E039 Hypothyroidism, unspecified: Secondary | ICD-10-CM | POA: Diagnosis not present

## 2015-03-22 DIAGNOSIS — T148XXA Other injury of unspecified body region, initial encounter: Secondary | ICD-10-CM

## 2015-03-22 LAB — HEMOGLOBIN A1C: HEMOGLOBIN A1C: 5.8 % (ref 4.6–6.5)

## 2015-03-22 LAB — CBC WITH DIFFERENTIAL/PLATELET
Basophils Absolute: 0 10*3/uL (ref 0.0–0.1)
Basophils Relative: 0.5 % (ref 0.0–3.0)
EOS PCT: 2.4 % (ref 0.0–5.0)
Eosinophils Absolute: 0.1 10*3/uL (ref 0.0–0.7)
HEMATOCRIT: 38.6 % — AB (ref 39.0–52.0)
HEMOGLOBIN: 12.9 g/dL — AB (ref 13.0–17.0)
LYMPHS ABS: 1.8 10*3/uL (ref 0.7–4.0)
Lymphocytes Relative: 30.6 % (ref 12.0–46.0)
MCHC: 33.3 g/dL (ref 30.0–36.0)
MCV: 96.7 fl (ref 78.0–100.0)
MONOS PCT: 10.5 % (ref 3.0–12.0)
Monocytes Absolute: 0.6 10*3/uL (ref 0.1–1.0)
NEUTROS ABS: 3.3 10*3/uL (ref 1.4–7.7)
Neutrophils Relative %: 56 % (ref 43.0–77.0)
Platelets: 339 10*3/uL (ref 150.0–400.0)
RBC: 3.99 Mil/uL — ABNORMAL LOW (ref 4.22–5.81)
RDW: 14.5 % (ref 11.5–15.5)
WBC: 6 10*3/uL (ref 4.0–10.5)

## 2015-03-22 NOTE — Telephone Encounter (Signed)
Pt is saying that he is wondering about changes made to his cpap machine. He says the pressure is still at 9 cm H2O. I checked Dr. Edwena Felty note from his visit on 6/9 and it says she wants his cpap at 9 cmH2O with 3 cm EPR. I explained this to pt, that it is supposed to be at 9 cm H2O. He verbalized understanding and said he will keep going with it.

## 2015-03-22 NOTE — Patient Instructions (Signed)
Please complete lab work prior to leaving.   Please schedule a follow up appointment in 3 months.  

## 2015-03-22 NOTE — Assessment & Plan Note (Signed)
BP stable on current meds. BMET up to date.

## 2015-03-22 NOTE — Assessment & Plan Note (Signed)
TSH stable. Continue synthroid 

## 2015-03-22 NOTE — Telephone Encounter (Signed)
Called pt to get more insight into his problem. No answer, left a message asking him to call me back.

## 2015-03-22 NOTE — Assessment & Plan Note (Signed)
Lipid panel ok except for low HDL, reinforced importance of exercise.

## 2015-03-22 NOTE — Progress Notes (Signed)
Pre visit review using our clinic review tool, if applicable. No additional management support is needed unless otherwise documented below in the visit note. 

## 2015-03-22 NOTE — Telephone Encounter (Signed)
See documentation from today's note.

## 2015-03-22 NOTE — Progress Notes (Signed)
Subjective:    Patient ID: [name] Pratt, male    DOB: 01-23-1941, 74 y.o.   MRN: [id number]  HPI  Mr. Mcquarrie is a 74 yr old male who presents today for follow up.  1) HTN-Patient is currently maintained on the following medications for blood pressure: hyzaar, toprol xl, hydralazine (cardiology stopped amlodipine due to LE edema which pt reports is now resolved) Patient reports good compliance with blood pressure medications. Patient denies chest pain, shortness of breath or swelling. Last 3 blood pressure readings in our office are as follows:  BP Readings from Last 3 Encounters:  03/22/15 130/70  02/18/15 160/62  02/17/15 148/62   2) Hypothyroid- reports feeling well controlled on current dose of synthroid.  Lab Results  Component Value Date   TSH 1.39 12/18/2014   3) Hyperlipidemia- on lopid and pravastatin.  Denies myalgia Lab Results  Component Value Date   CHOL 136 09/21/2014   HDL 28.50* 09/21/2014   LDLCALC 81 09/21/2014   TRIG 135.0 09/21/2014   CHOLHDL 5 09/21/2014    Review of Systems See HPI  Past Medical History  Diagnosis Date  . Hyperlipidemia     under control  . Chest pain      Myoview in 2008 was normal.  Echo 7/09: EF 60%, normal wall motion, mild LVH, mild LAE.  . Inguinal hernia   . Pancreatitis   . Rosacea   . Gout   . Arthritis   . History of chicken pox   . GERD (gastroesophageal reflux disease)   . History of hemorrhoids   . OSA (obstructive sleep apnea)   . Carotid stenosis     a. Carotid U/S 3/26: LICA < 71%, RICA 24-58%;  b.  Carotid U/S 0/99:  RICA 83-38%; LICA 2-50%; f/u 1 year  . Hypertension     under control  . COPD (chronic obstructive pulmonary disease)   . Hypothyroidism   . History of kidney stones   . Cancer     skin cancer  . Complication of anesthesia     "stomach does not wake up"  . Ileus   . PONV (postoperative nausea and vomiting)     History   Social History  . Marital Status: Married    Spouse  Name: N/A  . Number of Children: 1  . 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 Smoking status: Former Smoker -- 11 years    Types: Cigarettes    Quit date: 09/11/1978  . Smokeless tobacco: Never Used  . Alcohol Use: Yes     Comment: rare  . Drug Use: No  . Sexual Activity: Not on file   Other Topics Concern  . Not on file   Social History Narrative   Lives with his wife   He has one son- lives locally.   2 grandchildren   Has worked for fire department   Part time pain business   Enjoys wood working   Completed HS.  [employer]    Past Surgical History  Procedure Laterality Date  . Laparoscopic cholecystectomy  09/20/06    with intraoperative cholangiogram and right inguinal herniorrhaphy with mesh   . Hiatal hernia repair  12/21/93  . Knee surgery  1995  . Hemorrhoid surgery  2014  . Lithotripsy      x2  . Abdominal hernia repair  07/15/12    Dr  Reyes  . Nasal septum surgery  1975  . Esophageal dilation  1993 and 1994    multiple times  . Brow lift  05/07/01  . Urethral dilation  1991    [name]  . Epidural injections      multiple procedures  . 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 Ligament repair Left 07/2013    shoulder  . Skin cancer destruction      nose, ear  . Inguinal hernia repair Right 07/15/12    Dr [name], x2  . Joint replacement  07/25/04    right knee  . Joint replacement  07/13/10    left knee  . Total hip arthroplasty Left   . Eye surgery Bilateral 03/25/10, 2012    cataract removal  . Refractive surgery Bilateral   . Tonsillectomy  as child  . Cystoscopy with ureteroscopy and stent placement Right 01/28/2014    Procedure: CYSTOSCOPY WITH URETEROSCOPY, BASKET RETRIVAL AND  STENT PLACEMENT;  Surgeon: [name], MD;  Location: [facility];  Service: Urology;  Laterality: Right;  . Holmium laser application Right 1/61/0960    Procedure: HOLMIUM LASER APPLICATION;   Surgeon: [name], MD;  Location: [facility];  Service: Urology;  Laterality: Right;    Family History  Problem Relation Age of Onset  . Hypertension    . Cancer    . Osteoarthritis    . Cancer Mother     Allergies  Allergen Reactions  . Metoclopramide Hcl     anxiety    Current Outpatient Prescriptions on File Prior to Visit  Medication Sig Dispense Refill  . acetaminophen (TYLENOL) 500 MG tablet Take 500 mg by mouth every 6 (six) hours as needed for moderate pain.    [name] albuterol (PROVENTIL HFA;VENTOLIN HFA) 108 (90 BASE) MCG/ACT inhaler Inhale 2 puffs into the lungs every 6 (six) hours as needed for shortness of breath.    . allopurinol (ZYLOPRIM) 100 MG tablet Take 1 tablet (100 mg total) by mouth daily. 90 tablet 1  . allopurinol (ZYLOPRIM) 300 MG tablet TAKE ONE TABLET BY MOUTH ONCE DAILY 90 tablet 0  . aspirin EC 81 MG tablet Take 81 mg by mouth every morning.    . Carboxymethylcellul-Glycerin (OPTIVE) 0.5-0.9 % SOLN Apply 1 drop to eye daily.    . cycloSPORINE (RESTASIS) 0.05 % ophthalmic emulsion Place 1 drop into both eyes 2 (two) times daily.    . diclofenac sodium (VOLTAREN) 1 % GEL Apply 1 application topically daily as needed (arthritis). 100 g 0  . diphenhydrAMINE (BENADRYL) 25 MG tablet Take 25 mg by mouth every 6 (six) hours as needed for allergies.    [name] FIBER FORMULA CAPS Take 1 capsule by mouth daily.     [name] gemfibrozil (LOPID) 600 MG tablet TAKE ONE-HALF TABLET BY MOUTH TWICE DAILY BEFORE MEAL(S) 90 tablet 1  . glucose blood test strip Use as instructed to test blood sugar 1 - 2 times daily. 100 each 1  . hydrALAZINE (APRESOLINE) 50 MG tablet Take 1 tablet (50 mg total) by mouth 3 (three) times daily. 270 tablet 1  . IRON PO Take 50 mcg by mouth daily.    [name] levothyroxine (SYNTHROID, LEVOTHROID) 150 MCG tablet Take 1 tablet (150 mcg total) by mouth daily. 90 tablet 1  . losartan-hydrochlorothiazide (HYZAAR) 100-25 MG per tablet Take 1 tablet by mouth daily. 30  tablet 11  . metoprolol succinate (TOPROL-XL) 25 MG 24 hr tablet Take 1 tablet (25 mg total)  by mouth daily. 90 tablet 1  . metroNIDAZOLE (METROCREAM) 0.75 % cream Apply 1 application topically daily as needed (rosacia).    . multivitamin (THERAGRAN) per tablet Take 1 tablet by mouth daily.      . Omega-3 Fatty Acids (FISH OIL PO) Take 1 capsule by mouth 2 (two) times daily.    [name] omeprazole (PRILOSEC) 20 MG capsule Take 1 capsule (20 mg total) by mouth every morning. 90 capsule 1  . pravastatin (PRAVACHOL) 40 MG tablet Take 1 tablet (40 mg total) by mouth daily. 90 tablet 1  . sodium chloride (OCEAN) 0.65 % nasal spray Place 1 spray into both nostrils at bedtime as needed for congestion.     No current facility-administered medications on file prior to visit.    BP 130/70 mmHg  Pulse 64  Temp(Src) 97.6 F (36.4 C) (Oral)  Resp 16  Ht 5\' 6"  (1.676 m)  Wt 174 lb 12.8 oz (79.289 kg)  BMI 28.23 kg/m2  SpO2 98%       Objective:   Physical Exam  Constitutional: He is oriented to person, place, and time. He appears well-developed and well-nourished. No distress.  HENT:  Head: Normocephalic and atraumatic.  Cardiovascular: Normal rate and regular rhythm.   No murmur heard. Pulmonary/Chest: Effort normal and breath sounds normal. No respiratory distress. He has no wheezes. He has no rales.  Musculoskeletal: He exhibits no edema.  Neurological: He is alert and oriented to person, place, and time.  Skin: Skin is warm and dry.  Psychiatric: He has a normal mood and affect. His behavior is normal. Thought content normal.          Assessment & Plan:

## 2015-03-22 NOTE — Assessment & Plan Note (Signed)
>>  ASSESSMENT AND PLAN FOR BENIGN ESSENTIAL HYPERTENSION WRITTEN ON 03/22/2015  2:34 PM BY O'SULLIVAN, Islam Eichinger, NP  BP stable on current meds. BMET up to date.

## 2015-03-23 ENCOUNTER — Telehealth: Payer: Self-pay | Admitting: *Deleted

## 2015-03-23 DIAGNOSIS — D649 Anemia, unspecified: Secondary | ICD-10-CM

## 2015-03-23 NOTE — Telephone Encounter (Signed)
-----   Message from [name], NP sent at 03/22/2015 10:31 PM EDT ----- + anemia noted.  Advise pt to complete IFOB.  Also, please ask lab to add on the following if possible:  B12, folate, serum iron. Dx anemia.

## 2015-03-23 NOTE — Telephone Encounter (Signed)
Notified pt. Lab was unable to add additional labs below and lab appt has been scheduled for 03/26/15 at 9am.  Future orders entered. IFOB given to lab to give to pt at lab draw.

## 2015-03-25 ENCOUNTER — Telehealth: Payer: Self-pay | Admitting: Neurology

## 2015-03-25 NOTE — Telephone Encounter (Signed)
-----   Message from Lester Doral, RN sent at 03/25/2015  7:27 AM EDT ----- What are you thoughts on this pt? See my phone notes and Dr. Edwena Felty notes!

## 2015-03-25 NOTE — Telephone Encounter (Signed)
Called patient and left message to call me back to discuss cpap problems

## 2015-03-25 NOTE — Telephone Encounter (Signed)
-----   Message from Lester [place], RN sent at 03/25/2015  7:27 AM EDT ----- What are you thoughts on this pt? See my phone notes and Dr. Edwena Felty notes!

## 2015-03-26 ENCOUNTER — Other Ambulatory Visit (INDEPENDENT_AMBULATORY_CARE_PROVIDER_SITE_OTHER): Payer: Medicare Other

## 2015-03-26 DIAGNOSIS — D649 Anemia, unspecified: Secondary | ICD-10-CM | POA: Diagnosis not present

## 2015-03-26 LAB — VITAMIN B12: Vitamin B-12: 591 pg/mL (ref 211–911)

## 2015-03-26 LAB — FOLATE: Folate: >24.8 ng/mL (ref >5.9)

## 2015-03-26 LAB — IRON: IRON: 64 ug/dL (ref 42–165)

## 2015-03-28 ENCOUNTER — Encounter: Payer: Self-pay | Admitting: Family

## 2015-03-29 ENCOUNTER — Other Ambulatory Visit: Payer: Self-pay

## 2015-03-29 DIAGNOSIS — G4733 Obstructive sleep apnea (adult) (pediatric): Secondary | ICD-10-CM

## 2015-03-29 NOTE — Progress Notes (Signed)
Our sleep lab manager spoke to pt and she recommends a change to 11 cm H2O. Dr. Brett Fairy approved.

## 2015-03-30 ENCOUNTER — Other Ambulatory Visit (INDEPENDENT_AMBULATORY_CARE_PROVIDER_SITE_OTHER): Payer: Medicare Other

## 2015-03-30 DIAGNOSIS — D649 Anemia, unspecified: Secondary | ICD-10-CM

## 2015-03-31 LAB — FECAL OCCULT BLOOD, IMMUNOCHEMICAL: Fecal Occult Bld: NEGATIVE

## 2015-04-01 ENCOUNTER — Encounter: Payer: Self-pay | Admitting: Family

## 2015-04-01 DIAGNOSIS — D649 Anemia, unspecified: Secondary | ICD-10-CM

## 2015-04-15 NOTE — Telephone Encounter (Signed)
Please contact pt re: unread message.

## 2015-04-16 NOTE — Telephone Encounter (Signed)
Pt notified pt of above recommendation and lab appt scheduled for 05/28/15 at 10:30am.  Future lab order entered.

## 2015-04-16 NOTE — Telephone Encounter (Signed)
Pt has been made aware. See 04/01/15 unread mychart message.

## 2015-04-20 NOTE — Telephone Encounter (Signed)
Pt called office stating that he had not heard from [facility] yet for his cpap change in pressure to 11 cm. I advised pt that I would contact [place] and have them give pt a call. He verbalized understanding.

## 2015-04-27 ENCOUNTER — Telehealth: Payer: Self-pay

## 2015-04-27 NOTE — Telephone Encounter (Signed)
Noted! Thank you

## 2015-04-29 ENCOUNTER — Ambulatory Visit: Payer: Medicare Other | Admitting: Cardiovascular Disease

## 2015-05-04 NOTE — Progress Notes (Signed)
Patient ID: [name] Pratt, male   DOB: 05/15/1941, 74 y.o.   MRN: [id number] [name] Pratt returns today for F/U of SSCP, HTN, and increased lipids. He has no documented CAD with a normal myovue 3.14.2008. EF 62%. Echo 03/30/2008 done for dyspnea showed EF 60% with AV sclerosis and mild LAE. 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 He has been compliant with his BP meds and only takes gemfibrizol for triglycerides. His labs are followed at [facility] . He has significant arthritis and sees Dr. Delrae Sawyers for hip and back issues. These are relieved by Aleve. Has some exertional dsypnea but no PND orthopnea or edema. No cough sputum or wheezing.    Lab Results  Component Value Date   LDLCALC 81 09/21/2014   Carotid 02/11/14:  40-59% bilateral ICA disease needs f/u   Last couple of months has had LE edema worsening  amlodinpine stopped and started on Hyzaar   Lab Results  Component Value Date   CREATININE 1.39 03/09/2015   BUN 24* 03/09/2015   NA 141 03/09/2015   K 3.9 03/09/2015   CL 105 03/09/2015   CO2 28 03/09/2015    ROS: Denies fever, malais, weight loss, blurry vision, decreased visual acuity, cough, sputum, SOB, hemoptysis, pleuritic pain, palpitaitons, heartburn, abdominal pain, melena, lower extremity edema, claudication, or rash.  All other systems reviewed and negative  General: Affect appropriate Healthy:  appears stated age 74: normal Neck supple with no adenopathy JVP normal right  bruits no thyromegaly Lungs clear with no wheezing and good diaphragmatic motion Heart:  S1/S2 no murmur, no rub, gallop or click PMI normal Abdomen: benighn, BS positve, no tenderness, no AAA no bruit.  No HSM or HJR Distal pulses intact with no bruits Plus one bilateral edema Neuro non-focal Skin warm and dry No muscular weakness   Current Outpatient Prescriptions  Medication Sig Dispense Refill  . acetaminophen (TYLENOL) 500 MG tablet Take 500 mg by mouth every 6 (six) hours as needed for moderate  pain.    [name] albuterol (PROVENTIL HFA;VENTOLIN HFA) 108 (90 BASE) MCG/ACT inhaler Inhale 2 puffs into the lungs every 6 (six) hours as needed for shortness of breath.    . allopurinol (ZYLOPRIM) 100 MG tablet Take 1 tablet (100 mg total) by mouth daily. 90 tablet 1  . allopurinol (ZYLOPRIM) 300 MG tablet TAKE ONE TABLET BY MOUTH ONCE DAILY 90 tablet 0  . aspirin EC 81 MG tablet Take 81 mg by mouth every morning.    . Carboxymethylcellul-Glycerin (OPTIVE) 0.5-0.9 % SOLN Apply 1 drop to eye daily.    . cycloSPORINE (RESTASIS) 0.05 % ophthalmic emulsion Place 1 drop into both eyes 2 (two) times daily.    . diclofenac sodium (VOLTAREN) 1 % GEL Apply 1 application topically daily as needed (arthritis). 100 g 0  . diphenhydrAMINE (BENADRYL) 25 MG tablet Take 25 mg by mouth every 6 (six) hours as needed for allergies.    . fluticasone (FLONASE) 50 MCG/ACT nasal spray Place 2 sprays into both nostrils daily.  0  . gemfibrozil (LOPID) 600 MG tablet TAKE ONE-HALF TABLET BY MOUTH TWICE DAILY BEFORE MEAL(S) 90 tablet 1  . hydrALAZINE (APRESOLINE) 50 MG tablet Take 1 tablet (50 mg total) by mouth 3 (three) times daily. 270 tablet 1  . IRON PO Take 50 mcg by mouth daily.    [name] levothyroxine (SYNTHROID, LEVOTHROID) 150 MCG tablet Take 1 tablet (150 mcg total) by mouth daily. 90 tablet 1  . losartan-hydrochlorothiazide (HYZAAR) 100-25 MG per tablet  Take 1 tablet by mouth daily. 30 tablet 11  . metoprolol succinate (TOPROL-XL) 25 MG 24 hr tablet Take 1 tablet (25 mg total) by mouth daily. 90 tablet 1  . metroNIDAZOLE (METROCREAM) 0.75 % cream Apply 1 application topically daily as needed (rosacia).    . multivitamin (THERAGRAN) per tablet Take 1 tablet by mouth daily.      . Omega-3 Fatty Acids (FISH OIL PO) Take 1 capsule by mouth 2 (two) times daily.    [name] omeprazole (PRILOSEC) 20 MG capsule Take 1 capsule (20 mg total) by mouth every morning. 90 capsule 1  . pravastatin (PRAVACHOL) 40 MG tablet Take 1 tablet (40  mg total) by mouth daily. 90 tablet 1  . sodium chloride (OCEAN) 0.65 % nasal spray Place 1 spray into both nostrils at bedtime as needed for congestion.     No current facility-administered medications for this visit.    Allergies  Metoclopramide hcl  Electrocardiogram:  5/15  SR LAD normal otherwise   02/17/15  SR rate 51  RBBB LAFB  Assessment and Plan  Bruit: no change f/u duplex June 2017   ASA bilateral 40-59% ICA stenosis   HTN:  Better off norvasc and on Hyzaar   Chol:  Cholesterol is at goal.  Continue current dose of statin and diet Rx.  No myalgias or side effects.  F/U  LFT's in 6 months. Lab Results  Component Value Date   LDLCALC 81 09/21/2014            [name] Pratt

## 2015-05-06 ENCOUNTER — Ambulatory Visit (INDEPENDENT_AMBULATORY_CARE_PROVIDER_SITE_OTHER): Payer: Medicare Other | Admitting: Cardiovascular Disease

## 2015-05-06 ENCOUNTER — Encounter: Payer: Self-pay | Admitting: Cardiovascular Disease

## 2015-05-06 VITALS — BP 130/60 | HR 65 | Ht 67.0 in | Wt 177.8 lb

## 2015-05-06 DIAGNOSIS — I6523 Occlusion and stenosis of bilateral carotid arteries: Secondary | ICD-10-CM | POA: Diagnosis not present

## 2015-05-06 NOTE — Patient Instructions (Signed)
Medication Instructions:  No changes  Labwork: NONE  Testing/Procedures: NONE  Follow-Up: Your physician wants you to follow-up in: June  [place] will receive a reminder letter in the mail two months in advance. If you don't receive a letter, please call our office to schedule the follow-up appointment. Any Other Special Instructions Will Be Listed Below (If Applicable).

## 2015-05-28 ENCOUNTER — Ambulatory Visit: Payer: Medicare Other

## 2015-05-28 ENCOUNTER — Encounter: Payer: Self-pay | Admitting: Family

## 2015-05-28 ENCOUNTER — Other Ambulatory Visit (INDEPENDENT_AMBULATORY_CARE_PROVIDER_SITE_OTHER): Payer: Medicare Other

## 2015-05-28 DIAGNOSIS — Z23 Encounter for immunization: Secondary | ICD-10-CM

## 2015-05-28 DIAGNOSIS — D649 Anemia, unspecified: Secondary | ICD-10-CM

## 2015-05-28 LAB — CBC WITH DIFFERENTIAL/PLATELET
BASOS ABS: 0 10*3/uL (ref 0.0–0.1)
Basophils Relative: 0.6 % (ref 0.0–3.0)
EOS ABS: 0.2 10*3/uL (ref 0.0–0.7)
Eosinophils Relative: 2.4 % (ref 0.0–5.0)
HEMATOCRIT: 39.6 % (ref 39.0–52.0)
HEMOGLOBIN: 13 g/dL (ref 13.0–17.0)
LYMPHS PCT: 30.3 % (ref 12.0–46.0)
Lymphs Abs: 2.2 10*3/uL (ref 0.7–4.0)
MCHC: 32.8 g/dL (ref 30.0–36.0)
MCV: 97.3 fl (ref 78.0–100.0)
Monocytes Absolute: 0.6 10*3/uL (ref 0.1–1.0)
Monocytes Relative: 8.4 % (ref 3.0–12.0)
Neutro Abs: 4.2 10*3/uL (ref 1.4–7.7)
Neutrophils Relative %: 58.3 % (ref 43.0–77.0)
PLATELETS: 334 10*3/uL (ref 150.0–400.0)
RBC: 4.07 Mil/uL — ABNORMAL LOW (ref 4.22–5.81)
RDW: 14.3 % (ref 11.5–15.5)
WBC: 7.3 10*3/uL (ref 4.0–10.5)

## 2015-05-28 MED ORDER — INFLUENZA VAC SPLIT QUAD 0.5 ML IM SUSY
0.5000 mL | PREFILLED_SYRINGE | Freq: Once | INTRAMUSCULAR | Status: AC
  Administered 2015-05-28: 0.5 mL via INTRAMUSCULAR

## 2015-05-31 ENCOUNTER — Other Ambulatory Visit: Payer: Self-pay | Admitting: *Deleted

## 2015-05-31 DIAGNOSIS — I6523 Occlusion and stenosis of bilateral carotid arteries: Secondary | ICD-10-CM

## 2015-06-12 ENCOUNTER — Other Ambulatory Visit: Payer: Self-pay | Admitting: Family

## 2015-06-14 NOTE — Telephone Encounter (Signed)
90 day supply of allopurinol 100mg  sent to pharmacy. Pt last seen 03/2015 and advised 3 month follow up.  Please call pt to schedule f/u before further refills are due.  Thanks!

## 2015-06-18 NOTE — Telephone Encounter (Signed)
LVM advising patient of message below °

## 2015-06-21 ENCOUNTER — Telehealth: Payer: Self-pay | Admitting: Family

## 2015-06-21 NOTE — Telephone Encounter (Signed)
Pt has been schedule for his 3 mth fu on 06/30/15 at 10:45a.

## 2015-06-30 ENCOUNTER — Encounter: Payer: Self-pay | Admitting: Family

## 2015-06-30 ENCOUNTER — Telehealth: Payer: Self-pay | Admitting: Family

## 2015-06-30 ENCOUNTER — Ambulatory Visit (INDEPENDENT_AMBULATORY_CARE_PROVIDER_SITE_OTHER): Payer: Medicare Other | Admitting: Family

## 2015-06-30 VITALS — BP 144/72 | HR 56 | Temp 98.0°F | Ht 67.0 in | Wt 175.0 lb

## 2015-06-30 DIAGNOSIS — I10 Essential (primary) hypertension: Secondary | ICD-10-CM

## 2015-06-30 DIAGNOSIS — M5136 Other intervertebral disc degeneration, lumbar region: Secondary | ICD-10-CM

## 2015-06-30 DIAGNOSIS — E038 Other specified hypothyroidism: Secondary | ICD-10-CM

## 2015-06-30 DIAGNOSIS — E785 Hyperlipidemia, unspecified: Secondary | ICD-10-CM

## 2015-06-30 DIAGNOSIS — B351 Tinea unguium: Secondary | ICD-10-CM

## 2015-06-30 DIAGNOSIS — M51369 Other intervertebral disc degeneration, lumbar region without mention of lumbar back pain or lower extremity pain: Secondary | ICD-10-CM

## 2015-06-30 HISTORY — DX: Other intervertebral disc degeneration, lumbar region: M51.36

## 2015-06-30 HISTORY — DX: Other intervertebral disc degeneration, lumbar region without mention of lumbar back pain or lower extremity pain: M51.369

## 2015-06-30 HISTORY — DX: Tinea unguium: B35.1

## 2015-06-30 LAB — BASIC METABOLIC PANEL
BUN: 27 mg/dL — ABNORMAL HIGH (ref 6–23)
CO2: 29 mEq/L (ref 19–32)
Calcium: 9.8 mg/dL (ref 8.4–10.5)
Chloride: 102 mEq/L (ref 96–112)
Creatinine, Ser: 1.28 mg/dL (ref 0.40–1.50)
GFR: 58.17 mL/min — ABNORMAL LOW (ref >60.00)
Glucose, Bld: 112 mg/dL — ABNORMAL HIGH (ref 70–99)
Potassium: 4.4 mEq/L (ref 3.5–5.1)
SODIUM: 140 meq/L (ref 135–145)

## 2015-06-30 LAB — TSH: TSH: 0.39 u[IU]/mL (ref 0.35–4.50)

## 2015-06-30 MED ORDER — LEVOTHYROXINE SODIUM 150 MCG PO TABS: ORAL_TABLET | ORAL | Status: DC

## 2015-06-30 MED ORDER — TERBINAFINE HCL 250 MG PO TABS: 250.0000 mg | ORAL_TABLET | Freq: Every day | ORAL | Status: DC

## 2015-06-30 NOTE — Patient Instructions (Signed)
Please complete lab work prior to leaving.  Follow up in 6 months.  

## 2015-06-30 NOTE — Assessment & Plan Note (Signed)
LDL at goal, continue fish oil and pravastatin.

## 2015-06-30 NOTE — Assessment & Plan Note (Signed)
rx with lamisil x 12 weeks, repeat LFT in 1 month.

## 2015-06-30 NOTE — Progress Notes (Signed)
Pre visit review using our clinic review tool, if applicable. No additional management support is needed unless otherwise documented below in the visit note. 

## 2015-06-30 NOTE — Progress Notes (Signed)
Subjective:    Patient ID: [name] Pratt, male    DOB: 03-22-1941, 74 y.o.   MRN: [id number]  HPI   [name] is a 74 yr old male who presents today for follow up.  1) HTN- mainatined on hyzaar, toprol xl.  Reports that BP at home was 117/59 a few days ago.   BP Readings from Last 3 Encounters:  06/30/15 144/72  05/06/15 130/60  03/22/15 130/70   2) Hyperlipidemia- maintained on fish oil and pravastatin.   Lab Results  Component Value Date   CHOL 136 09/21/2014   HDL 28.50* 09/21/2014   LDLCALC 81 09/21/2014   TRIG 135.0 09/21/2014   CHOLHDL 5 09/21/2014   3) Hypothyroid-  Tolerating synthroid.   Lab Results  Component Value Date   TSH 1.39 12/18/2014    Reports some burning/aching in his feet.  Declines follow up MRI.  Notes some stabbing pain in bilateral great toes intermittently  C/o onychomycosis- bilaterally Review of Systems  Respiratory: Negative for shortness of breath.   Cardiovascular: Negative for chest pain.  Musculoskeletal: Negative for myalgias.   See HPI  Past Medical History  Diagnosis Date  . Hyperlipidemia     under control  . Chest pain      Myoview in 2008 was normal.  Echo 7/09: EF 60%, normal wall motion, mild LVH, mild LAE.  . Inguinal hernia   . Pancreatitis   . Rosacea   . Gout   . Arthritis   . History of chicken pox   . GERD (gastroesophageal reflux disease)   . History of hemorrhoids   . OSA (obstructive sleep apnea)   . Carotid stenosis     a. Carotid U/S 4/74: LICA < 25%, RICA 95-63%;  b.  Carotid U/S 8/75:  RICA 64-33%; LICA 2-95%; f/u 1 year  . Hypertension     under control  . COPD (chronic obstructive pulmonary disease) ([place])   . Hypothyroidism   . History of kidney stones   . Cancer ([place])     skin cancer  . Complication of anesthesia     "stomach does not wake up"  . Ileus ([place])   . PONV (postoperative nausea and vomiting)     Social History   Social History  . Marital Status: Married    Spouse  Name: N/A  . Number of Children: 1  . 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 Smoking status: Former Smoker -- 11 years    Types: Cigarettes    Quit date: 09/11/1978  . Smokeless tobacco: Never Used  . Alcohol Use: Yes     Comment: rare  . Drug Use: No  . Sexual Activity: Not on file   Other Topics Concern  . Not on file   Social History Narrative   Lives with his wife   He has one son- lives locally.   2 grandchildren   Has worked for fire department   Part time pain business   Enjoys wood working   Completed HS.  [employer]    Past Surgical History  Procedure Laterality Date  . Laparoscopic cholecystectomy  09/20/06    with intraoperative cholangiogram and right inguinal herniorrhaphy with mesh   . Hiatal hernia repair  12/21/93  . Knee surgery  1995  . Hemorrhoid surgery  2014  . Lithotripsy      x2  .  Abdominal hernia repair  07/15/12    Dr [name]  . Nasal septum surgery  1975  . Esophageal dilation  1993 and 1994    multiple times  . Brow lift  05/07/01  . Urethral dilation  1991    [name]  . Epidural injections      multiple procedures  . 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 Ligament repair Left 07/2013    shoulder  . Skin cancer destruction      nose, ear  . Inguinal hernia repair Right 07/15/12    Dr [name], x2  . Joint replacement  07/25/04    right knee  . Joint replacement  07/13/10    left knee  . Total hip arthroplasty Left   . Eye surgery Bilateral 03/25/10, 2012    cataract removal  . Refractive surgery Bilateral   . Tonsillectomy  as child  . Cystoscopy with ureteroscopy and stent placement Right 01/28/2014    Procedure: CYSTOSCOPY WITH URETEROSCOPY, BASKET RETRIVAL AND  STENT PLACEMENT;  Surgeon: [name], MD;  Location: [facility];  Service: Urology;  Laterality: Right;  . Holmium laser application Right 1/75/1025    Procedure: HOLMIUM LASER APPLICATION;   Surgeon: [name], MD;  Location: [facility];  Service: Urology;  Laterality: Right;    Family History  Problem Relation Age of Onset  . Hypertension    . Cancer    . Osteoarthritis    . Cancer Mother     Allergies  Allergen Reactions  . Metoclopramide Hcl     anxiety    Current Outpatient Prescriptions on File Prior to Visit  Medication Sig Dispense Refill  . acetaminophen (TYLENOL) 500 MG tablet Take 500 mg by mouth every 6 (six) hours as needed for moderate pain.    [name] albuterol (PROVENTIL HFA;VENTOLIN HFA) 108 (90 BASE) MCG/ACT inhaler Inhale 2 puffs into the lungs every 6 (six) hours as needed for shortness of breath.    . allopurinol (ZYLOPRIM) 100 MG tablet TAKE ONE TABLET BY MOUTH ONCE DAILY 90 tablet 0  . allopurinol (ZYLOPRIM) 300 MG tablet TAKE ONE TABLET BY MOUTH ONCE DAILY 90 tablet 0  . aspirin EC 81 MG tablet Take 81 mg by mouth every morning.    . Carboxymethylcellul-Glycerin (OPTIVE) 0.5-0.9 % SOLN Apply 1 drop to eye daily.    . cycloSPORINE (RESTASIS) 0.05 % ophthalmic emulsion Place 1 drop into both eyes 2 (two) times daily.    . diclofenac sodium (VOLTAREN) 1 % GEL Apply 1 application topically daily as needed (arthritis). 100 g 0  . diphenhydrAMINE (BENADRYL) 25 MG tablet Take 25 mg by mouth every 6 (six) hours as needed for allergies.    . fluticasone (FLONASE) 50 MCG/ACT nasal spray Place 2 sprays into both nostrils daily.  0  . gemfibrozil (LOPID) 600 MG tablet TAKE ONE-HALF TABLET BY MOUTH TWICE DAILY BEFORE MEAL(S) 90 tablet 1  . hydrALAZINE (APRESOLINE) 50 MG tablet Take 1 tablet (50 mg total) by mouth 3 (three) times daily. 270 tablet 1  . IRON PO Take 50 mcg by mouth daily.    [name] levothyroxine (SYNTHROID, LEVOTHROID) 150 MCG tablet Take 1 tablet (150 mcg total) by mouth daily. 90 tablet 1  . losartan-hydrochlorothiazide (HYZAAR) 100-25 MG per tablet Take 1 tablet by mouth daily. 30 tablet 11  . metoprolol succinate (TOPROL-XL) 25 MG 24 hr tablet Take 1  tablet (25 mg total) by mouth daily. 90 tablet 1  . metroNIDAZOLE (METROCREAM) 0.75 %  cream Apply 1 application topically daily as needed (rosacia).    . multivitamin (THERAGRAN) per tablet Take 1 tablet by mouth daily.      . Omega-3 Fatty Acids (FISH OIL PO) Take 1 capsule by mouth 2 (two) times daily.    [name] omeprazole (PRILOSEC) 20 MG capsule Take 1 capsule (20 mg total) by mouth every morning. 90 capsule 1  . pravastatin (PRAVACHOL) 40 MG tablet Take 1 tablet (40 mg total) by mouth daily. 90 tablet 1  . sodium chloride (OCEAN) 0.65 % nasal spray Place 1 spray into both nostrils at bedtime as needed for congestion.     No current facility-administered medications on file prior to visit.    BP 144/72 mmHg  Pulse 56  Temp(Src) 98 F (36.7 C) (Oral)  Ht 5\' 7"  (1.702 m)  Wt 175 lb (79.379 kg)  BMI 27.40 kg/m2  SpO2 93%       Objective:   Physical Exam  Constitutional: He is oriented to person, place, and time. He appears well-developed and well-nourished. No distress.  HENT:  Head: Normocephalic and atraumatic.  Cardiovascular: Normal rate and regular rhythm.   No murmur heard. Pulmonary/Chest: Effort normal and breath sounds normal. No respiratory distress. He has no wheezes. He has no rales.  Musculoskeletal: He exhibits no edema.  Neurological: He is alert and oriented to person, place, and time.  Skin: Skin is warm and dry.  R great toenail is partially missing, remaining nail is thickened and discolored.   Psychiatric: He has a normal mood and affect. His behavior is normal. Thought content normal.          Assessment & Plan:

## 2015-06-30 NOTE — Assessment & Plan Note (Addendum)
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 We discussed repeating MRI (previous MRI 2011 showed significant lumbar disc disease) or further work up at this time.

## 2015-06-30 NOTE — Telephone Encounter (Signed)
Labs show we need to decrease his synthroid slightly.  Please advise pt to take 1 tab by mouth once daily 6 days a week, 1/2 tab one day a week.  Kidney function and sugar are stable.

## 2015-06-30 NOTE — Assessment & Plan Note (Signed)
Clinically stable on current dose of synthroid, continue same, obtain TSH.

## 2015-06-30 NOTE — Assessment & Plan Note (Signed)
Fair BP today. Continue current meds.

## 2015-06-30 NOTE — Assessment & Plan Note (Signed)
>>  ASSESSMENT AND PLAN FOR BENIGN ESSENTIAL HYPERTENSION WRITTEN ON 06/30/2015  2:32 PM BY O'SULLIVAN, Mithra Spano, NP  Fair BP today. Continue current meds.

## 2015-07-02 ENCOUNTER — Telehealth: Payer: Self-pay | Admitting: Family

## 2015-07-02 NOTE — Telephone Encounter (Signed)
Pt returning your call, best # [phone].

## 2015-07-02 NOTE — Telephone Encounter (Signed)
Pt is returning your call.     CB: [phone]

## 2015-07-02 NOTE — Telephone Encounter (Signed)
Please see phone note stamped 06/30/15.

## 2015-07-02 NOTE — Telephone Encounter (Signed)
Left a message for call back.  

## 2015-07-02 NOTE — Telephone Encounter (Signed)
Called the patient informed of results and PCP instructions.  The patient did verbally understand/agreed to instructions.

## 2015-07-14 ENCOUNTER — Other Ambulatory Visit: Payer: Self-pay | Admitting: Family

## 2015-08-02 ENCOUNTER — Other Ambulatory Visit (INDEPENDENT_AMBULATORY_CARE_PROVIDER_SITE_OTHER): Payer: Medicare Other

## 2015-08-02 DIAGNOSIS — B351 Tinea unguium: Secondary | ICD-10-CM | POA: Diagnosis not present

## 2015-08-02 LAB — HEPATIC FUNCTION PANEL
ALBUMIN: 4.5 g/dL (ref 3.5–5.2)
ALK PHOS: 63 U/L (ref 39–117)
ALT: 17 U/L (ref 0–53)
AST: 19 U/L (ref 0–37)
Bilirubin, Direct: 0.1 mg/dL (ref 0.0–0.3)
TOTAL PROTEIN: 7.9 g/dL (ref 6.0–8.3)
Total Bilirubin: 0.6 mg/dL (ref 0.2–1.2)

## 2015-08-03 ENCOUNTER — Other Ambulatory Visit: Payer: Self-pay | Admitting: Family

## 2015-08-03 MED ORDER — TERBINAFINE HCL 250 MG PO TABS: 250.0000 mg | ORAL_TABLET | Freq: Every day | ORAL | Status: DC

## 2015-08-24 ENCOUNTER — Other Ambulatory Visit: Payer: Self-pay | Admitting: Family

## 2015-08-24 ENCOUNTER — Telehealth: Payer: Self-pay

## 2015-08-24 NOTE — Telephone Encounter (Signed)
Spoke to patient. Agreed to see NP-Megan on upcoming appt 09/02/15.

## 2015-08-26 ENCOUNTER — Ambulatory Visit: Payer: Medicare Other | Admitting: Neurology

## 2015-08-27 ENCOUNTER — Encounter (HOSPITAL_BASED_OUTPATIENT_CLINIC_OR_DEPARTMENT_OTHER): Payer: Self-pay | Admitting: *Deleted

## 2015-08-27 ENCOUNTER — Emergency Department (HOSPITAL_BASED_OUTPATIENT_CLINIC_OR_DEPARTMENT_OTHER)
Admission: EM | Admit: 2015-08-27 | Discharge: 2015-08-27 | Disposition: A | Discharge status: Home / Self Care | Payer: Medicare Other | Attending: Emergency Medicine | Admitting: Emergency Medicine

## 2015-08-27 DIAGNOSIS — Z7951 Long term (current) use of inhaled steroids: Secondary | ICD-10-CM | POA: Insufficient documentation

## 2015-08-27 DIAGNOSIS — M1990 Unspecified osteoarthritis, unspecified site: Secondary | ICD-10-CM | POA: Insufficient documentation

## 2015-08-27 DIAGNOSIS — Z8619 Personal history of other infectious and parasitic diseases: Secondary | ICD-10-CM | POA: Insufficient documentation

## 2015-08-27 DIAGNOSIS — Z7982 Long term (current) use of aspirin: Secondary | ICD-10-CM | POA: Insufficient documentation

## 2015-08-27 DIAGNOSIS — Z87891 Personal history of nicotine dependence: Secondary | ICD-10-CM | POA: Diagnosis not present

## 2015-08-27 DIAGNOSIS — Z8669 Personal history of other diseases of the nervous system and sense organs: Secondary | ICD-10-CM | POA: Insufficient documentation

## 2015-08-27 DIAGNOSIS — Z87442 Personal history of urinary calculi: Secondary | ICD-10-CM | POA: Diagnosis not present

## 2015-08-27 DIAGNOSIS — I10 Essential (primary) hypertension: Secondary | ICD-10-CM

## 2015-08-27 DIAGNOSIS — E039 Hypothyroidism, unspecified: Secondary | ICD-10-CM | POA: Diagnosis not present

## 2015-08-27 DIAGNOSIS — Z85828 Personal history of other malignant neoplasm of skin: Secondary | ICD-10-CM | POA: Diagnosis not present

## 2015-08-27 DIAGNOSIS — Z79899 Other long term (current) drug therapy: Secondary | ICD-10-CM | POA: Diagnosis not present

## 2015-08-27 DIAGNOSIS — Z872 Personal history of diseases of the skin and subcutaneous tissue: Secondary | ICD-10-CM | POA: Diagnosis not present

## 2015-08-27 DIAGNOSIS — J449 Chronic obstructive pulmonary disease, unspecified: Secondary | ICD-10-CM | POA: Insufficient documentation

## 2015-08-27 DIAGNOSIS — E7800 Pure hypercholesterolemia, unspecified: Secondary | ICD-10-CM | POA: Insufficient documentation

## 2015-08-27 DIAGNOSIS — M109 Gout, unspecified: Secondary | ICD-10-CM | POA: Diagnosis not present

## 2015-08-27 DIAGNOSIS — K219 Gastro-esophageal reflux disease without esophagitis: Secondary | ICD-10-CM | POA: Diagnosis not present

## 2015-08-27 DIAGNOSIS — E785 Hyperlipidemia, unspecified: Secondary | ICD-10-CM | POA: Diagnosis not present

## 2015-08-27 LAB — BASIC METABOLIC PANEL
ANION GAP: 11 (ref 5–15)
BUN: 34 mg/dL — ABNORMAL HIGH (ref 6–20)
CO2: 23 mmol/L (ref 22–32)
CREATININE: 1.56 mg/dL — AB (ref 0.61–1.24)
Calcium: 9 mg/dL (ref 8.9–10.3)
Chloride: 105 mmol/L (ref 101–111)
GFR calc non Af Amer: 42 mL/min — ABNORMAL LOW (ref >60)
GFR, EST AFRICAN AMERICAN: 48 mL/min — AB (ref >60)
Glucose, Bld: 163 mg/dL — ABNORMAL HIGH (ref 65–99)
Potassium: 3.5 mmol/L (ref 3.5–5.1)
SODIUM: 139 mmol/L (ref 135–145)

## 2015-08-27 LAB — CBC WITH DIFFERENTIAL/PLATELET
Basophils Absolute: 0 10*3/uL (ref 0.0–0.1)
Basophils Relative: 1 %
EOS ABS: 0.1 10*3/uL (ref 0.0–0.7)
EOS PCT: 3 %
HCT: 41 % (ref 39.0–52.0)
HEMOGLOBIN: 13.4 g/dL (ref 13.0–17.0)
LYMPHS ABS: 2 10*3/uL (ref 0.7–4.0)
Lymphocytes Relative: 35 %
MCH: 31.8 pg (ref 26.0–34.0)
MCHC: 32.7 g/dL (ref 30.0–36.0)
MCV: 97.4 fL (ref 78.0–100.0)
MONO ABS: 0.6 10*3/uL (ref 0.1–1.0)
MONOS PCT: 10 %
Neutro Abs: 2.9 10*3/uL (ref 1.7–7.7)
Neutrophils Relative %: 51 %
PLATELETS: 296 10*3/uL (ref 150–400)
RBC: 4.21 MIL/uL — ABNORMAL LOW (ref 4.22–5.81)
RDW: 13.7 % (ref 11.5–15.5)
WBC: 5.6 10*3/uL (ref 4.0–10.5)

## 2015-08-27 LAB — TROPONIN I: TROPONIN I: 0.03 ng/mL (ref <0.031)

## 2015-08-27 MED ORDER — CLONIDINE HCL 0.1 MG PO TABS
0.1000 mg | ORAL_TABLET | Freq: Once | ORAL | Status: AC
  Administered 2015-08-27: 0.1 mg via ORAL
  Filled 2015-08-27: qty 1

## 2015-08-27 NOTE — ED Provider Notes (Signed)
CSN: [id number]     Arrival date & time 08/27/15  1036 History   First MD Initiated Contact with Patient 08/27/15 1057     Chief Complaint  Patient presents with  . Hypertension     (Consider location/radiation/quality/duration/timing/severity/associated sxs/prior Treatment) HPI Comments: Patient is a 74 year old male with history of hypertension, high cholesterol. He presents for evaluation of elevated blood pressure for the past 2 days. He states no new changes in his blood pressure medications recently. He denies any chest pain or difficulty breathing. He denies any fevers or chills. He denies any other symptoms. His blood pressure readings at home have been [id number] systolic and this is concerning to him.  Patient is a 74 y.o. male presenting with hypertension. The history is provided by the patient.  Hypertension This is a chronic problem. The current episode started yesterday. The problem occurs constantly. The problem has not changed since onset.Pertinent negatives include no chest pain and no headaches. Nothing aggravates the symptoms. Nothing relieves the symptoms. He has tried nothing for the symptoms.    Past Medical History  Diagnosis Date  . Hyperlipidemia     under control  . Chest pain      Myoview in 2008 was normal.  Echo 7/09: EF 60%, normal wall motion, mild LVH, mild LAE.  . Inguinal hernia   . Pancreatitis   . Rosacea   . Gout   . Arthritis   . History of chicken pox   . GERD (gastroesophageal reflux disease)   . History of hemorrhoids   . OSA (obstructive sleep apnea)   . Carotid stenosis     a. Carotid U/S [id number]: LICA < [id number], RICA 0000000;  b.  Carotid U/S 0000000:  RICA 123456; LICA XX123456; f/u 1 year  . Hypertension     under control  . COPD (chronic obstructive pulmonary disease) ([place])   . Hypothyroidism   . History of kidney stones   . Cancer ([place])     skin cancer  . Complication of anesthesia     "stomach does not wake up"  . Ileus ([place])   . PONV  (postoperative nausea and vomiting)    Past Surgical History  Procedure Laterality Date  . Laparoscopic cholecystectomy  09/20/06    with intraoperative cholangiogram and right inguinal herniorrhaphy with mesh   . Hiatal hernia repair  12/21/93  . Knee surgery  1995  . Hemorrhoid surgery  2014  . Lithotripsy      x2  . Abdominal hernia repair  07/15/12    Dr [name]  . Nasal septum surgery  1975  . Esophageal dilation  1993 and 1994    multiple times  . Brow lift  05/07/01  . Urethral dilation  1991    [name]  . Epidural injections      multiple procedures  . 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 Ligament repair Left 07/2013    shoulder  . Skin cancer destruction      nose, ear  . Inguinal hernia repair Right 07/15/12    Dr [name], x2  . Joint replacement  07/25/04    right knee  . Joint replacement  07/13/10    left knee  . Total hip arthroplasty Left   . Eye surgery Bilateral 03/25/10, 2012    cataract removal  . Refractive surgery Bilateral   . Tonsillectomy  as child  . Cystoscopy with ureteroscopy and stent placement Right 01/28/2014    Procedure: CYSTOSCOPY WITH URETEROSCOPY, BASKET  RETRIVAL AND  STENT PLACEMENT;  Surgeon: [name], MD;  Location: [facility];  Service: Urology;  Laterality: Right;  . Holmium laser application Right [id number]    Procedure: HOLMIUM LASER APPLICATION;  Surgeon: [name], MD;  Location: [facility];  Service: Urology;  Laterality: Right;   Family History  Problem Relation Age of Onset  . Hypertension    . Cancer    . Osteoarthritis    . Cancer Mother    Social History  Substance Use Topics  . Smoking status: Former Smoker -- 11 years    Types: Cigarettes    Quit date: 09/11/1978  . Smokeless tobacco: Never Used  . Alcohol Use: Yes     Comment: rare    Review of Systems  Cardiovascular: Negative for chest pain.  Neurological: Negative for headaches.  All other systems reviewed and are negative.     Allergies   Metoclopramide hcl  Home Medications   Prior to Admission medications   Medication Sig Start Date End Date Taking? Authorizing Provider  acetaminophen (TYLENOL) 500 MG tablet Take 500 mg by mouth every 6 (six) hours as needed for moderate pain.   Yes Historical Provider, MD  albuterol (PROVENTIL HFA;VENTOLIN HFA) 108 (90 BASE) MCG/ACT inhaler Inhale 2 puffs into the lungs every 6 (six) hours as needed for shortness of breath.   Yes Historical Provider, MD  allopurinol (ZYLOPRIM) 100 MG tablet TAKE ONE TABLET BY MOUTH ONCE DAILY 06/14/15  Yes [name], NP  allopurinol (ZYLOPRIM) 300 MG tablet TAKE ONE TABLET BY MOUTH ONCE DAILY 12/14/14  Yes [name], NP  aspirin EC 81 MG tablet Take 81 mg by mouth every morning.   Yes Historical Provider, MD  Carboxymethylcellul-Glycerin (OPTIVE) 0.5-0.9 % SOLN Apply 1 drop to eye daily.   Yes Historical Provider, MD  cycloSPORINE (RESTASIS) 0.05 % ophthalmic emulsion Place 1 drop into both eyes 2 (two) times daily.   Yes Historical Provider, MD  diclofenac sodium (VOLTAREN) 1 % GEL Apply 1 application topically daily as needed (arthritis). 12/18/14  Yes [name], NP  diphenhydrAMINE (BENADRYL) 25 MG tablet Take 25 mg by mouth every 6 (six) hours as needed for allergies.   Yes Historical Provider, MD  fluticasone (FLONASE) 50 MCG/ACT nasal spray Place 2 sprays into both nostrils daily. 03/08/15  Yes Historical Provider, MD  gemfibrozil (LOPID) 600 MG tablet TAKE ONE-HALF TABLET BY MOUTH TWICE DAILY BEFORE MEAL(S) 12/18/14  Yes [name], NP  hydrALAZINE (APRESOLINE) 50 MG tablet Take 1 tablet (50 mg total) by mouth 3 (three) times daily. 12/18/14  Yes [name], NP  IRON PO Take 50 mcg by mouth daily.   Yes Historical Provider, MD  levothyroxine (SYNTHROID, LEVOTHROID) 150 MCG tablet 1 tab by mouth once daily 6 days a week, 1/2 tab one day a week. 06/30/15  Yes [name], NP  losartan-hydrochlorothiazide (HYZAAR)  100-25 MG per tablet Take 1 tablet by mouth daily. 02/17/15  Yes [name], MD  metoprolol succinate (TOPROL-XL) 25 MG 24 hr tablet TAKE ONE TABLET BY MOUTH ONCE DAILY 07/15/15  Yes [name], NP  metroNIDAZOLE (METROCREAM) 0.75 % cream Apply 1 application topically daily as needed (rosacia).   Yes Historical Provider, MD  multivitamin [facility]) per tablet Take 1 tablet by mouth daily.     Yes Historical Provider, MD  Omega-3 Fatty Acids (FISH OIL PO) Take 1 capsule by mouth 2 (two) times daily.   Yes Historical Provider, MD  omeprazole (PRILOSEC) 20 MG  capsule Take 1 capsule (20 mg total) by mouth every morning. 12/18/14  Yes [name], NP  pravastatin (PRAVACHOL) 40 MG tablet TAKE ONE TABLET BY MOUTH ONCE DAILY 08/25/15  Yes [name], NP  sodium chloride (OCEAN) 0.65 % nasal spray Place 1 spray into both nostrils at bedtime as needed for congestion.   Yes Historical Provider, MD  terbinafine (LAMISIL) 250 MG tablet Take 1 tablet (250 mg total) by mouth daily. 08/03/15  Yes [name], NP   BP 186/97 mmHg  Pulse 69  Temp(Src) 97.6 F (36.4 C) (Oral)  Resp 18  Ht 5\' 7"  (1.702 m)  Wt 180 lb (81.647 kg)  BMI 28.19 kg/m2  SpO2 98% Physical Exam  Constitutional: He is oriented to person, place, and time. He appears well-developed and well-nourished. No distress.  HENT:  Head: Normocephalic and atraumatic.  Mouth/Throat: Oropharynx is clear and moist.  Eyes: EOM are normal. Pupils are equal, round, and reactive to light.  Neck: Normal range of motion. Neck supple.  Cardiovascular: Normal rate, regular rhythm and normal heart sounds.   No murmur heard. Pulmonary/Chest: Effort normal and breath sounds normal. No respiratory distress. He has no wheezes. He has no rales.  Abdominal: Soft. Bowel sounds are normal.  Musculoskeletal: Normal range of motion. He exhibits no edema.  Neurological: He is alert and oriented to person, place, and time.  Skin: Skin  is warm and dry. He is not diaphoretic.  Nursing note and vitals reviewed.   ED Course  Procedures (including critical care time) Labs Review Labs Reviewed  BASIC METABOLIC PANEL  CBC WITH DIFFERENTIAL/PLATELET  TROPONIN I    Imaging Review No results found. I have personally reviewed and evaluated these images and lab results as part of my medical decision-making.   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 His neurologic exam is nonfocal and workup reveals no significant abnormalities. He was given a dose of clonidine and his blood pressure is much improved. He will be discharged with instructions to keep a record of his blood pressures and take this with him to his next doctor's appointment. He is to return as needed for any problems.    Veryl Speak, MD 08/27/15 [phone]

## 2015-08-27 NOTE — ED Notes (Signed)
Patient states he was seen at the Urologist yesterday for routine check up, states when he got home he noticed that his blood pressure was recorded 190/95.  States he checked his B/P several more times and eventually it came down to 160/90.  This morning his pressure continued to be elevated.  Denies headache, chest pain, however, he states he woke up this morning with left jaw pain, which he contributes to sleeping wrong on his pillow.  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 Tillie Fantasia.

## 2015-08-27 NOTE — Discharge Instructions (Signed)
Keep a record of your blood pressures which you can take with you to your next doctor's appointment.  Return to the emergency department if symptoms significantly worsen or change.   Hypertension Hypertension, commonly called high blood pressure, is when the force of blood pumping through your arteries is too strong. Your arteries are the blood vessels that carry blood from your heart throughout your body. A blood pressure reading consists of a higher number over a lower number, such as 110/72. The higher number (systolic) is the pressure inside your arteries when your heart pumps. The lower number (diastolic) is the pressure inside your arteries when your heart relaxes. Ideally you want your blood pressure below 120/80. Hypertension forces your heart to work harder to pump blood. Your arteries may become narrow or stiff. Having untreated or uncontrolled hypertension can cause heart attack, stroke, kidney disease, and other problems. RISK FACTORS Some risk factors for high blood pressure are controllable. Others are not.  Risk factors you cannot control include:   Race. You may be at higher risk if you are African American.  Age. Risk increases with age.  Gender. Men are at higher risk than women before age 66 years. After age 87, women are at higher risk than men. Risk factors you can control include:  Not getting enough exercise or physical activity.  Being overweight.  Getting too much fat, sugar, calories, or salt in your diet.  Drinking too much alcohol. SIGNS AND SYMPTOMS Hypertension does not usually cause signs or symptoms. Extremely high blood pressure (hypertensive crisis) may cause headache, anxiety, shortness of breath, and nosebleed. DIAGNOSIS To check if you have hypertension, your health care provider will measure your blood pressure while you are seated, with your arm held at the level of your heart. It should be measured at least twice using the same arm. Certain  conditions can cause a difference in blood pressure between your right and left arms. A blood pressure reading that is higher than normal on one occasion does not mean that you need treatment. If it is not clear whether you have high blood pressure, you may be asked to return on a different day to have your blood pressure checked again. Or, you may be asked to monitor your blood pressure at home for 1 or more weeks. TREATMENT Treating high blood pressure includes making lifestyle changes and possibly taking medicine. Living a healthy lifestyle can help lower high blood pressure. You may need to change some of your habits. Lifestyle changes may include:  Following the DASH diet. This diet is high in fruits, vegetables, and whole grains. It is low in salt, red meat, and added sugars.  Keep your sodium intake below 2,300 mg per day.  Getting at least 30-45 minutes of aerobic exercise at least 4 times per week.  Losing weight if necessary.  Not smoking.  Limiting alcoholic beverages.  Learning ways to reduce stress. Your health care provider may prescribe medicine if lifestyle changes are not enough to get your blood pressure under control, and if one of the following is true:  You are 65-31 years of age and your systolic blood pressure is above 140.  You are 49 years of age or older, and your systolic blood pressure is above 150.  Your diastolic blood pressure is above 90.  You have diabetes, and your systolic blood pressure is over [id number] or your diastolic blood pressure is over 90.  You have kidney disease and your blood pressure is above 140/90.  You have heart disease and your blood pressure is above 140/90. Your personal target blood pressure may vary depending on your medical conditions, your age, and other factors. HOME CARE INSTRUCTIONS  Have your blood pressure rechecked as directed by your health care provider.   Take medicines only as directed by your health care provider.  Follow the directions carefully. Blood pressure medicines must be taken as prescribed. The medicine does not work as well when you skip doses. Skipping doses also puts you at risk for problems.  Do not smoke.   Monitor your blood pressure at home as directed by your health care provider. SEEK MEDICAL CARE IF:   You think you are having a reaction to medicines taken.  You have recurrent headaches or feel dizzy.  You have swelling in your ankles.  You have trouble with your vision. SEEK IMMEDIATE MEDICAL CARE IF:  You develop a severe headache or confusion.  You have unusual weakness, numbness, or feel faint.  You have severe chest or abdominal pain.  You vomit repeatedly.  You have trouble breathing. MAKE SURE YOU:   Understand these instructions.  Will watch your condition.  Will get help right away if you are not doing well or get worse.   This information is not intended to replace advice given to you by your health care provider. Make sure you discuss any questions you have with your health care provider.   Document Released: 08/28/2005 Document Revised: 01/12/2015 Document Reviewed: 06/20/2013 Elsevier Interactive Patient Education [employer].

## 2015-08-28 ENCOUNTER — Other Ambulatory Visit: Payer: Self-pay | Admitting: Family

## 2015-08-30 MED ORDER — LEVOTHYROXINE SODIUM 150 MCG PO TABS: ORAL_TABLET | ORAL | Status: DC

## 2015-09-02 ENCOUNTER — Encounter: Payer: Self-pay | Admitting: Adult Health

## 2015-09-02 ENCOUNTER — Ambulatory Visit: Payer: Medicare Other | Admitting: Neurology

## 2015-09-02 ENCOUNTER — Ambulatory Visit (INDEPENDENT_AMBULATORY_CARE_PROVIDER_SITE_OTHER): Payer: Medicare Other | Admitting: Adult Health

## 2015-09-02 VITALS — BP 190/79 | HR 58 | Ht 67.0 in | Wt 182.0 lb

## 2015-09-02 DIAGNOSIS — G4733 Obstructive sleep apnea (adult) (pediatric): Secondary | ICD-10-CM

## 2015-09-02 DIAGNOSIS — Z9989 Dependence on other enabling machines and devices: Principal | ICD-10-CM

## 2015-09-02 NOTE — Patient Instructions (Signed)
Continue using CPAP nightly If your symptoms worsen or you develop new symptoms please let us know.   

## 2015-09-02 NOTE — Progress Notes (Signed)
PATIENT: [name] Pratt DOB: [birth year]  REASON FOR VISIT: follow up- OSA HISTORY FROM: patient  HISTORY OF PRESENT ILLNESS: Mr. Diab is a 74 year old male with a history of obstructive sleep apnea. He returns today for a compliance download. At the last visit his pressure settings were adjusted. The patient brought his memory card with him today however we areunable to  obtain a download. We will call his DME company to see if they can fax 1 over. The patient reports that he does use the CPAP nightly. He is tolerating the current pressure settings. He states that he typically goes to bed around 11 PM and arises at 7:30 AM. He does wake up during the night however he is able to go right back to sleep.urrently uses the full face mask. He states that he does not have a leak with the mask unless he turns over to his side. At that time if it does start leaking he can adjust this mass and it creates a better seal. Overall he feels that he is doingwell. His Epworth sleepiness score is 10 and fatigue severity score is 25. He returns today for an evaluation.  HISTORY 09/16/14: Mr. Tellman is a 74 year old male with history of obstructive sleep Apnea. He returns today for a 30 daydownload. At the last visit his AHI was 13.8 so his pressure setting was adjusted to 9cm. He brought his machine with him today but we are unable to get a recent download. the patient assures me that his DME company changed his settings and he has been using his machine nightly. Overall he doesn't notice a significant change with the pressure change other than sometimes the pressure is "strong" and can blow the mask off. The patient states that he feels that he is getting good sleep. If he sits down in front of a computer or tv after getting up he may tend to doze off. He still notices some daytime sleepiness. He gets about 7 hours of sleep. He does feel that the CPAP has helped.   ADDEDUM: We were able to get a download from  the patient's DME: it showed an AHI of 14.4 on 9 cm H2O with EPR 2. His compliance is 90%. No leak noted. He uses his machine on average 6 hours and 40 minutes.   REVIEW OF SYSTEMS: Out of a complete 14 system review of symptoms, the patient complains only of the following symptoms, and all other reviewed systems are negative.  Fatigue, trouble swallowing, shortness of breath, swollen abdomen, neck stiffness, itching   ALLERGIES: Allergies  Allergen Reactions  . Metoclopramide Hcl     anxiety    HOME MEDICATIONS: Outpatient Prescriptions Prior to Visit  Medication Sig Dispense Refill  . allopurinol (ZYLOPRIM) 100 MG tablet TAKE ONE TABLET BY MOUTH ONCE DAILY 90 tablet 0  . allopurinol (ZYLOPRIM) 300 MG tablet TAKE ONE TABLET BY MOUTH ONCE DAILY 90 tablet 0  . acetaminophen (TYLENOL) 500 MG tablet Take 500 mg by mouth every 6 (six) hours as needed for moderate pain.    [name] albuterol (PROVENTIL HFA;VENTOLIN HFA) 108 (90 BASE) MCG/ACT inhaler Inhale 2 puffs into the lungs every 6 (six) hours as needed for shortness of breath.    [name] aspirin EC 81 MG tablet Take 81 mg by mouth every morning.    . Carboxymethylcellul-Glycerin (OPTIVE) 0.5-0.9 % SOLN Apply 1 drop to eye daily.    . cycloSPORINE (RESTASIS) 0.05 % ophthalmic emulsion Place 1 drop into  both eyes 2 (two) times daily.    . diclofenac sodium (VOLTAREN) 1 % GEL Apply 1 application topically daily as needed (arthritis). 100 g 0  . diphenhydrAMINE (BENADRYL) 25 MG tablet Take 25 mg by mouth every 6 (six) hours as needed for allergies.    . fluticasone (FLONASE) 50 MCG/ACT nasal spray Place 2 sprays into both nostrils daily.  0  . gemfibrozil (LOPID) 600 MG tablet TAKE ONE-HALF TABLET BY MOUTH TWICE DAILY BEFORE MEAL(S) 90 tablet 1  . hydrALAZINE (APRESOLINE) 50 MG tablet Take 1 tablet (50 mg total) by mouth 3 (three) times daily. 270 tablet 1  . IRON PO Take 50 mcg by mouth daily.    [name] levothyroxine (SYNTHROID, LEVOTHROID) 150 MCG tablet 1  tab by mouth once daily 6 days a week, 1/2 tab one day a week. 78 tablet 1  . losartan-hydrochlorothiazide (HYZAAR) 100-25 MG per tablet Take 1 tablet by mouth daily. 30 tablet 11  . metoprolol succinate (TOPROL-XL) 25 MG 24 hr tablet TAKE ONE TABLET BY MOUTH ONCE DAILY 90 tablet 1  . metroNIDAZOLE (METROCREAM) 0.75 % cream Apply 1 application topically daily as needed (rosacia).    . multivitamin (THERAGRAN) per tablet Take 1 tablet by mouth daily.      . Omega-3 Fatty Acids (FISH OIL PO) Take 1 capsule by mouth 2 (two) times daily.    [name] omeprazole (PRILOSEC) 20 MG capsule Take 1 capsule (20 mg total) by mouth every morning. 90 capsule 1  . pravastatin (PRAVACHOL) 40 MG tablet TAKE ONE TABLET BY MOUTH ONCE DAILY 90 tablet 1  . sodium chloride (OCEAN) 0.65 % nasal spray Place 1 spray into both nostrils at bedtime as needed for congestion.    . terbinafine (LAMISIL) 250 MG tablet Take 1 tablet (250 mg total) by mouth daily. 30 tablet 1   No facility-administered medications prior to visit.    PAST MEDICAL HISTORY: Past Medical History  Diagnosis Date  . Hyperlipidemia     under control  . Chest pain      Myoview in 2008 was normal.  Echo 7/09: EF 60%, normal wall motion, mild LVH, mild LAE.  . Inguinal hernia   . Pancreatitis   . Rosacea   . Gout   . Arthritis   . History of chicken pox   . GERD (gastroesophageal reflux disease)   . History of hemorrhoids   . OSA (obstructive sleep apnea)   . Carotid stenosis     a. Carotid U/S [id number]: LICA < A999333, RICA 0000000;  b.  Carotid U/S 0000000:  RICA 123456; LICA [id number]; f/u 1 year  . Hypertension     under control  . COPD (chronic obstructive pulmonary disease) ([place])   . Hypothyroidism   . History of kidney stones   . Cancer ([place])     skin cancer  . Complication of anesthesia     "stomach does not wake up"  . Ileus ([place])   . PONV (postoperative nausea and vomiting)     PAST SURGICAL HISTORY: Past Surgical History  Procedure Laterality  Date  . Laparoscopic cholecystectomy  09/20/06    with intraoperative cholangiogram and right inguinal herniorrhaphy with mesh   . Hiatal hernia repair  12/21/93  . Knee surgery  1995  . Hemorrhoid surgery  2014  . Lithotripsy      x2  . Abdominal hernia repair  07/15/12    Dr [name]  . Nasal septum surgery  1975  . Esophageal dilation  1993 and 1994    multiple times  . Brow lift  05/07/01  . Urethral dilation  1991    [name]  . Epidural injections      multiple procedures  . 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 Ligament repair Left 07/2013    shoulder  . Skin cancer destruction      nose, ear  . Inguinal hernia repair Right 07/15/12    Dr [name], x2  . Joint replacement  07/25/04    right knee  . Joint replacement  07/13/10    left knee  . Total hip arthroplasty Left   . Eye surgery Bilateral 03/25/10, 2012    cataract removal  . Refractive surgery Bilateral   . Tonsillectomy  as child  . Cystoscopy with ureteroscopy and stent placement Right 01/28/2014    Procedure: CYSTOSCOPY WITH URETEROSCOPY, BASKET RETRIVAL AND  STENT PLACEMENT;  Surgeon: [name], MD;  Location: [facility];  Service: Urology;  Laterality: Right;  . Holmium laser application Right [id number]    Procedure: HOLMIUM LASER APPLICATION;  Surgeon: [name], MD;  Location: [facility];  Service: Urology;  Laterality: Right;    FAMILY HISTORY: Family History  Problem Relation Age of Onset  . Hypertension    . Cancer    . Osteoarthritis    . Cancer Mother     SOCIAL HISTORY: Social History   Social History  . Marital Status: Married    Spouse Name: N/A  . Number of Children: 1  . 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 Smoking status: Former Smoker -- 11 years    Types: Cigarettes    Quit date: 09/11/1978  . Smokeless tobacco: Never Used  . Alcohol Use: Yes     Comment: rare  . Drug Use: No  . Sexual Activity:  Not on file   Other Topics Concern  . Not on file   Social History Narrative   Lives with his wife   He has one son- lives locally.   2 grandchildren   Has worked for fire department   Part time pain business   Enjoys wood working   Completed HS.  [employer]      PHYSICAL EXAM  Filed Vitals:   09/02/15 1300  BP: 190/79  Pulse: 58  Height: 5\' 7"  (1.702 m)  Weight: 182 lb (82.555 kg)   Body mass index is 28.5 kg/(m^2).  Generalized: Well developed, in no acute distress   Neurological examination  Mentation: Alert oriented to time, place, history taking. Follows all commands speech and language fluent Cranial nerve II-XII: Pupils were equal round reactive to light. Extraocular movements were full, visual field were full on confrontational test. Facial sensation and strength were normal. Uvula tongue midline. Head turning and shoulder shrug  were normal and symmetric. Motor: The motor testing reveals 5 over 5 strength of all 4 extremities. Good symmetric motor tone is noted throughout.  Sensory: Sensory testing is intact to soft touch on all 4 extremities. No evidence of extinction is noted.  Coordination: Cerebellar testing reveals good finger-nose-finger and heel-to-shin bilaterally.  Gait and station: Gait is normal. Tandem gait is normal. Romberg is negative. No drift is seen.  Reflexes: Deep tendon reflexes are symmetric and normal bilaterally.   DIAGNOSTIC DATA (LABS, IMAGING, TESTING) - I reviewed patient records, labs, notes, testing and imaging myself  where available.  Lab Results  Component Value Date   WBC 5.6 08/27/2015   HGB 13.4 08/27/2015   HCT 41.0 08/27/2015   MCV 97.4 08/27/2015   PLT 296 08/27/2015      Component Value Date/Time   NA 139 08/27/2015 1120   K 3.5 08/27/2015 1120   CL 105 08/27/2015 1120   CO2 23 08/27/2015 1120   GLUCOSE 163* 08/27/2015 1120   BUN 34* 08/27/2015 1120   CREATININE 1.56* 08/27/2015 1120   CREATININE 0.95  05/26/2013 1134   CALCIUM 9.0 08/27/2015 1120   PROT 7.9 08/02/2015 0757   ALBUMIN 4.5 08/02/2015 0757   AST 19 08/02/2015 0757   ALT 17 08/02/2015 0757   ALKPHOS 63 08/02/2015 0757   BILITOT 0.6 08/02/2015 0757   GFRNONAA 42* 08/27/2015 1120   GFRNONAA 73 11/25/2012 1103   GFRAA 48* 08/27/2015 1120   GFRAA 84 11/25/2012 1103   Lab Results  Component Value Date   CHOL 136 09/21/2014   HDL 28.50* 09/21/2014   LDLCALC 81 09/21/2014   TRIG 135.0 09/21/2014   CHOLHDL 5 09/21/2014   Lab Results  Component Value Date   HGBA1C 5.8 03/22/2015   Lab Results  Component Value Date   VITAMINB12 591 03/26/2015   Lab Results  Component Value Date   TSH 0.39 06/30/2015      ASSESSMENT AND PLAN 74 y.o. year old male  has a past medical history of Hyperlipidemia; Chest pain; Inguinal hernia; Pancreatitis; Rosacea; Gout; Arthritis; History of chicken pox; GERD (gastroesophageal reflux disease); History of hemorrhoids; OSA (obstructive sleep apnea); Carotid stenosis; Hypertension; COPD (chronic obstructive pulmonary disease) ([place]); Hypothyroidism; History of kidney stones; Cancer ([place]); Complication of anesthesia; Ileus ([place]); and PONV (postoperative nausea and vomiting). here with:  1. Obstructive sleep apnea on CPAP    We were unable to obtain a download today. We'll contact his DME company and ask that they fax [place] a recent download. I will also ask that his DME company replace his memory card. I will call the patient once I have obtained his download. Patient is amenable to this plan. He will follow-up in 6 months or sooner if needed.    Ward Givens, MSN, NP-C 09/02/2015, 1:13 PM [facility] [address], [place], [place] [address] [phone]

## 2015-09-02 NOTE — Progress Notes (Signed)
I agree with the assessment and plan as directed - [name] Pratt is doing well in spite of higher AHI residuals, no adjustments at this time .    Marland KitchenThe patient is known to me .   [name], MD

## 2015-09-06 ENCOUNTER — Other Ambulatory Visit: Payer: Self-pay | Admitting: Family

## 2015-09-12 ENCOUNTER — Other Ambulatory Visit: Payer: Self-pay | Admitting: Family

## 2015-09-13 MED ORDER — ALLOPURINOL 100 MG PO TABS: 100.0000 mg | ORAL_TABLET | Freq: Every day | ORAL | Status: DC

## 2015-09-15 ENCOUNTER — Ambulatory Visit (INDEPENDENT_AMBULATORY_CARE_PROVIDER_SITE_OTHER): Payer: Commercial Managed Care - HMO | Admitting: Nurse Practitioner

## 2015-09-15 ENCOUNTER — Encounter: Payer: Self-pay | Admitting: Nurse Practitioner

## 2015-09-15 ENCOUNTER — Telehealth: Payer: Self-pay | Admitting: *Deleted

## 2015-09-15 VITALS — BP 140/84 | HR 68 | Ht 67.0 in | Wt 184.8 lb

## 2015-09-15 DIAGNOSIS — I10 Essential (primary) hypertension: Secondary | ICD-10-CM

## 2015-09-15 DIAGNOSIS — E7800 Pure hypercholesterolemia, unspecified: Secondary | ICD-10-CM | POA: Diagnosis not present

## 2015-09-15 LAB — BASIC METABOLIC PANEL
BUN: 29 mg/dL — ABNORMAL HIGH (ref 7–25)
CO2: 21 mmol/L (ref 20–31)
Calcium: 9.5 mg/dL (ref 8.6–10.3)
Chloride: 105 mmol/L (ref 98–110)
Creat: 1.44 mg/dL — ABNORMAL HIGH (ref 0.70–1.18)
Glucose, Bld: 125 mg/dL — ABNORMAL HIGH (ref 65–99)
Potassium: 3.6 mmol/L (ref 3.5–5.3)
Sodium: 140 mmol/L (ref 135–146)

## 2015-09-15 NOTE — Progress Notes (Signed)
CARDIOLOGY OFFICE NOTE  Date:  09/15/2015    [name] Pratt Date of Birth: [birth year] Medical Record [id number]  PCP:  Nance Pear., NP  Cardiologist:  Johnsie Cancel    Chief Complaint  Patient presents with  . Hypertension    Post ER visit - seen for Dr. Johnsie Cancel    History of Present Illness: [name] Pratt is a 74 y.o. male who presents today for a work in visit - seen for Dr. Johnsie Cancel.   He has a history of HLD, GERD, carotid disease, HTN and COPD. Other issues as noted below. Remote echo from 2009 with normal EF. Normal Myoview from 2008.   Last seen here in August - felt to be doing ok.   Presented to the ER about 2 weeks ago - BP had been elevated for about 2 days - given dose of Clonidine. Asked to follow up here.   Comes back today. Here alone. He notes more lability in his BP. He has had readings over [id number] systolic and other readings down to [id number] systolic. Does have some CKD noted. No mention of possible RAS. No chest pain. Breathing is stable - no worse and says he is at his baseline. Tries to restrict his salt. Not dizzy or lightheaded.   Past Medical History  Diagnosis Date  . Hyperlipidemia     under control  . Chest pain      Myoview in 2008 was normal.  Echo 7/09: EF 60%, normal wall motion, mild LVH, mild LAE.  . Inguinal hernia   . Pancreatitis   . Rosacea   . Gout   . Arthritis   . History of chicken pox   . GERD (gastroesophageal reflux disease)   . History of hemorrhoids   . OSA (obstructive sleep apnea)   . Carotid stenosis     a. Carotid U/S [id number]: LICA < [id number], RICA 0000000;  b.  Carotid U/S 0000000:  RICA 123456; LICA [id number]; f/u 1 year  . Hypertension     under control  . COPD (chronic obstructive pulmonary disease) ([place])   . Hypothyroidism   . History of kidney stones   . Cancer ([place])     skin cancer  . Complication of anesthesia     "stomach does not wake up"  . Ileus ([place])   . PONV (postoperative nausea and vomiting)     Past  Surgical History  Procedure Laterality Date  . Laparoscopic cholecystectomy  09/20/06    with intraoperative cholangiogram and right inguinal herniorrhaphy with mesh   . Hiatal hernia repair  12/21/93  . Knee surgery  1995  . Hemorrhoid surgery  2014  . Lithotripsy      x2  . Abdominal hernia repair  07/15/12    Dr [name]  . Nasal septum surgery  1975  . Esophageal dilation  1993 and 1994    multiple times  . Brow lift  05/07/01  . Urethral dilation  1991    [name]  . Epidural injections      multiple procedures  . 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 Ligament repair Left 07/2013    shoulder  . Skin cancer destruction      nose, ear  . Inguinal hernia repair Right 07/15/12    Dr [name], x2  . Joint replacement  07/25/04    right knee  . Joint replacement  07/13/10    left knee  . Total hip arthroplasty Left   . Eye  surgery Bilateral 03/25/10, 2012    cataract removal  . Refractive surgery Bilateral   . Tonsillectomy  as child  . Cystoscopy with ureteroscopy and stent placement Right 01/28/2014    Procedure: CYSTOSCOPY WITH URETEROSCOPY, BASKET RETRIVAL AND  STENT PLACEMENT;  Surgeon: [name], MD;  [facility];  Service: Urology;  Laterality: Right;  . Holmium laser application Right [id number]    Procedure: HOLMIUM LASER APPLICATION;  Surgeon: [name], MD;  [facility];  Service: Urology;  Laterality: Right;     Medications: Current Outpatient Prescriptions  Medication Sig Dispense Refill  . acetaminophen (TYLENOL) 500 MG tablet Take 500 mg by mouth every 6 (six) hours as needed for moderate pain.    [name] albuterol (PROVENTIL HFA;VENTOLIN HFA) 108 (90 BASE) MCG/ACT inhaler Inhale 2 puffs into the lungs every 6 (six) hours as needed for shortness of breath.    . allopurinol (ZYLOPRIM) 100 MG tablet Take 1 tablet (100 mg total) by mouth daily. 30 tablet 6  . allopurinol (ZYLOPRIM) 300 MG tablet TAKE ONE TABLET BY MOUTH ONCE DAILY 90 tablet 1  . aspirin EC  81 MG tablet Take 81 mg by mouth every morning.    . Carboxymethylcellul-Glycerin (OPTIVE) 0.5-0.9 % SOLN Apply 1 drop to eye daily.    . cycloSPORINE (RESTASIS) 0.05 % ophthalmic emulsion Place 1 drop into both eyes 2 (two) times daily.    . diclofenac sodium (VOLTAREN) 1 % GEL Apply 1 application topically daily as needed (arthritis). 100 g 0  . diphenhydrAMINE (BENADRYL) 25 MG tablet Take 25 mg by mouth every 6 (six) hours as needed for allergies.    . fluticasone (FLONASE) 50 MCG/ACT nasal spray Place 2 sprays into both nostrils daily.  0  . gemfibrozil (LOPID) 600 MG tablet TAKE ONE-HALF TABLET BY MOUTH TWICE DAILY BEFORE MEAL(S) 90 tablet 1  . hydrALAZINE (APRESOLINE) 50 MG tablet Take 1 tablet (50 mg total) by mouth 3 (three) times daily. 270 tablet 1  . IRON PO Take 50 mcg by mouth daily.    [name] levothyroxine (SYNTHROID, LEVOTHROID) 150 MCG tablet 1 tab by mouth once daily 6 days a week, 1/2 tab one day a week. 78 tablet 1  . losartan-hydrochlorothiazide (HYZAAR) 100-25 MG per tablet Take 1 tablet by mouth daily. 30 tablet 11  . metoprolol succinate (TOPROL-XL) 25 MG 24 hr tablet TAKE ONE TABLET BY MOUTH ONCE DAILY 90 tablet 1  . metroNIDAZOLE (METROCREAM) 0.75 % cream Apply 1 application topically daily as needed (rosacia).    . multivitamin (THERAGRAN) per tablet Take 1 tablet by mouth daily.      . Omega-3 Fatty Acids (FISH OIL PO) Take 1 capsule by mouth 2 (two) times daily.    [name] omeprazole (PRILOSEC) 20 MG capsule Take 1 capsule (20 mg total) by mouth every morning. 90 capsule 1  . pravastatin (PRAVACHOL) 40 MG tablet TAKE ONE TABLET BY MOUTH ONCE DAILY 90 tablet 1  . sodium chloride (OCEAN) 0.65 % nasal spray Place 1 spray into both nostrils at bedtime as needed for congestion.    . terbinafine (LAMISIL) 250 MG tablet Take 1 tablet (250 mg total) by mouth daily. 30 tablet 1   No current facility-administered medications for this visit.    Allergies: Allergies  Allergen Reactions    . Metoclopramide Hcl     anxiety    Social History: The patient  reports that he quit smoking about 37 years ago. His smoking use included Cigarettes. He  quit after 11 years of use. He has never used smokeless tobacco. He reports that he drinks alcohol. He reports that he does not use illicit drugs.   Family History: The patient's family history includes Cancer in his mother.   Review of Systems: Please see the history of present illness.   Otherwise, the review of systems is positive for none.   All other systems are reviewed and negative.   Physical Exam: VS:  BP 140/84 mmHg  Pulse 68  Ht 5\' 7"  (1.702 m)  Wt 184 lb 12.8 oz (83.825 kg)  BMI 28.94 kg/m2  SpO2 97% .  BMI Body mass index is 28.94 kg/(m^2).  Wt Readings from Last 3 Encounters:  09/15/15 184 lb 12.8 oz (83.825 kg)  09/02/15 182 lb (82.555 kg)  08/27/15 180 lb (81.647 kg)   BP is 160/80 in both arms for me.   General: Pleasant. Well developed, well nourished and in no acute distress. His weight is increasing.   HEENT: Normal. Neck: Supple Cardiac: Regular rate and rhythm. No murmurs, rubs, or gallops. No edema.  Respiratory:  Lungs are clear to auscultation bilaterally with normal work of breathing.  GI: Soft and nontender. I do not appreciate any bruits.  MS: No deformity or atrophy. Gait and ROM intact. Skin: Warm and dry. Color is normal.  Neuro:  Strength and sensation are intact and no gross focal deficits noted.  Psych: Alert, appropriate and with normal affect.   LABORATORY DATA:  EKG:  EKG is not ordered today.  Lab Results  Component Value Date   WBC 5.6 08/27/2015   HGB 13.4 08/27/2015   HCT 41.0 08/27/2015   PLT 296 08/27/2015   GLUCOSE 163* 08/27/2015   CHOL 136 09/21/2014   TRIG 135.0 09/21/2014   HDL 28.50* 09/21/2014   LDLCALC 81 09/21/2014   ALT 17 08/02/2015   AST 19 08/02/2015   NA 139 08/27/2015   K 3.5 08/27/2015   CL 105 08/27/2015   CREATININE 1.56* 08/27/2015   BUN 34*  08/27/2015   CO2 23 08/27/2015   TSH 0.39 06/30/2015   PSA 0.31 05/26/2013   INR 1.06 12/11/2012   HGBA1C 5.8 03/22/2015    BNP (last 3 results) No results for input(s): BNP in the last 8760 hours.  ProBNP (last 3 results) No results for input(s): PROBNP in the last 8760 hours.   Other Studies Reviewed Today:   Assessment/Plan: 1. HTN - labile HTN - I am concerned about possible RAS - will send for duplex study. For now, no change in his medicines. Needs to get his cuff checked for correlation. Will see back for discussion.   2. HLD - on statin therapy.   3. Carotid disease - for repeat study in 05/2016  4. COPD with stable dyspnea.   5. CKD - recheck BMET today. Checking renal duplex as well.   Current medicines are reviewed with the patient today.  The patient does not have concerns regarding medicines other than what has been noted above.  The following changes have been made:  See above.  Labs/ tests ordered today include:    Orders Placed This Encounter  Procedures  . Basic metabolic panel     Disposition:   FU in one month with BP cuff check as well.   Patient is agreeable to this plan and will call if any problems develop in the interim.   Signed: Burtis Junes, RN, ANP-C 09/15/2015 9:29 AM   [facility]  [address] [place], [place]  [address] Phone: [phone] Fax: [phone]

## 2015-09-15 NOTE — Telephone Encounter (Signed)
Pt has been made aware of lab results and verbalized understanding.

## 2015-09-15 NOTE — Telephone Encounter (Signed)
-----   Message from Burtis Junes, NP sent at 09/15/2015  4:38 PM EST ----- Ok to report. Lab is ok - will see how his renal duplex turns out.

## 2015-09-15 NOTE — Patient Instructions (Addendum)
We will be checking the following labs today - BMET   Medication Instructions:    Continue with your current medicines for now    Testing/Procedures To Be Arranged:  Renal duplex - looking for renal artery stenosis  Follow-Up:   See Dr. Johnsie Cancel in one month - bring BP cuff to visit to get checked.    Other Special Instructions:   Monitor your blood pressure and keep a diary    If you need a refill on your cardiac medications before your next appointment, please call your pharmacy.   Call the [place] office at [phone] if you have any questions, problems or concerns.

## 2015-09-22 ENCOUNTER — Ambulatory Visit (HOSPITAL_COMMUNITY)
Admission: RE | Admit: 2015-09-22 | Discharge: 2015-09-22 | Disposition: A | Discharge status: Home / Self Care | Payer: Commercial Managed Care - HMO | Source: Ambulatory Visit | Attending: Nurse Practitioner | Admitting: Nurse Practitioner

## 2015-09-22 DIAGNOSIS — E785 Hyperlipidemia, unspecified: Secondary | ICD-10-CM | POA: Diagnosis not present

## 2015-09-22 DIAGNOSIS — I10 Essential (primary) hypertension: Secondary | ICD-10-CM | POA: Diagnosis not present

## 2015-09-22 DIAGNOSIS — I700 Atherosclerosis of aorta: Secondary | ICD-10-CM | POA: Insufficient documentation

## 2015-09-22 DIAGNOSIS — E7800 Pure hypercholesterolemia, unspecified: Secondary | ICD-10-CM

## 2015-09-27 ENCOUNTER — Telehealth: Payer: Self-pay | Admitting: Adult Health

## 2015-09-27 NOTE — Telephone Encounter (Signed)
Patient called to advise when he was here 09/02/15 we were unable to download card from CPAP, we were to contact DME company and someone would be in touch with him however patient states he hasn't heard from anyone.

## 2015-09-28 NOTE — Telephone Encounter (Signed)
Spoke to pt. He has not brought his cpap to be downloaded yet to [facility]. He thought our office was going to call him, and then he would bring his cpap to [facility]. I advised him to go ahead and bring his cpap to [facility] and they will fax [place] his download, and then we will call him when we get the results of his download. Pt verbalized understanding.

## 2015-10-04 DIAGNOSIS — G4733 Obstructive sleep apnea (adult) (pediatric): Secondary | ICD-10-CM | POA: Diagnosis not present

## 2015-10-13 ENCOUNTER — Telehealth: Payer: Self-pay | Admitting: Family

## 2015-10-13 DIAGNOSIS — H547 Unspecified visual loss: Secondary | ICD-10-CM

## 2015-10-13 DIAGNOSIS — H579 Unspecified disorder of eye and adnexa: Secondary | ICD-10-CM

## 2015-10-13 NOTE — Telephone Encounter (Signed)
Pt sees Dr. Zadie Rhine as retina specialist. He has appt 10/21/15 at 10:45am. Pt insurance requires referral for recheck of his eyes/retina issues.   [facility]: Hurman Horn MD  Doctor in [place], [place]  Address: [address], [place], [place] [address]  Phone: [phone]

## 2015-10-14 NOTE — Telephone Encounter (Signed)
Please advise 

## 2015-10-15 ENCOUNTER — Telehealth: Payer: Self-pay | Admitting: *Deleted

## 2015-10-15 NOTE — Telephone Encounter (Signed)
[name] Pratt-- I pended opthalmology referral but was unsure of the diagnosis to use? Please advise.

## 2015-10-15 NOTE — Telephone Encounter (Signed)
Received fax from [facility] requesting amoxicillin 500mg . Take 4 capsules by mouth one hour before appt., Qty 12.  Per verbal from PCP, new research shows no need to pre-medicate for prior joint replacement. If pt wants to proceed with Rx, ok to send. Left message for pt to return my call.

## 2015-10-18 NOTE — Telephone Encounter (Signed)
Spoke with pt and notified him of below recommendation. He states his dentist tells him he still needs pre-medication prior to dental work. He states that he had an appt on Friday with his dentist and they took care of the antibiotic on Friday. He will try to notify us in advance before further dental procedures.

## 2015-10-19 ENCOUNTER — Telehealth: Payer: Self-pay | Admitting: Family

## 2015-10-19 ENCOUNTER — Other Ambulatory Visit: Payer: Self-pay | Admitting: Cardiovascular Disease

## 2015-10-19 MED ORDER — ALLOPURINOL 100 MG PO TABS: 100.0000 mg | ORAL_TABLET | Freq: Every day | ORAL | Status: DC

## 2015-10-19 MED ORDER — OMEPRAZOLE 20 MG PO CPDR: 20.0000 mg | DELAYED_RELEASE_CAPSULE | Freq: Every morning | ORAL | Status: DC

## 2015-10-19 MED ORDER — GEMFIBROZIL 600 MG PO TABS: ORAL_TABLET | ORAL | Status: DC

## 2015-10-19 MED ORDER — PRAVASTATIN SODIUM 40 MG PO TABS: 40.0000 mg | ORAL_TABLET | Freq: Every day | ORAL | Status: DC

## 2015-10-19 MED ORDER — ALLOPURINOL 300 MG PO TABS: 300.0000 mg | ORAL_TABLET | Freq: Every day | ORAL | Status: DC

## 2015-10-19 MED ORDER — HYDRALAZINE HCL 50 MG PO TABS: 50.0000 mg | ORAL_TABLET | Freq: Three times a day (TID) | ORAL | Status: DC

## 2015-10-19 MED ORDER — METOPROLOL SUCCINATE ER 25 MG PO TB24: 25.0000 mg | ORAL_TABLET | Freq: Every day | ORAL | Status: DC

## 2015-10-19 MED ORDER — LEVOTHYROXINE SODIUM 150 MCG PO TABS: ORAL_TABLET | ORAL | Status: DC

## 2015-10-19 MED ORDER — FLUTICASONE PROPIONATE 50 MCG/ACT NA SUSP: 2.0000 | Freq: Every day | NASAL | Status: DC

## 2015-10-19 MED ORDER — LOSARTAN POTASSIUM-HCTZ 100-25 MG PO TABS: 1.0000 | ORAL_TABLET | Freq: Every day | ORAL | Status: DC

## 2015-10-19 NOTE — Telephone Encounter (Signed)
Left message at home # to return my call re: below.

## 2015-10-19 NOTE — Telephone Encounter (Signed)
OK to use humana glucometer.

## 2015-10-19 NOTE — Telephone Encounter (Signed)
Relation to [id number] Call back Arnold [place], [place] [phone] (Phone) [phone] (Fax)         Reason for call: patient states he would like all his medication to be sent to mail order 90 day supply allopurinol (ZYLOPRIM) 100 MG tablet, gemfibrozil (LOPID) 600 MG tablet and metoprolol succinate (TOPROL-XL) 25 MG 24 hr tablet

## 2015-10-19 NOTE — Telephone Encounter (Signed)
Also received faxes from [facility] requesting refills on restasis 0.05% eye drops and humana true metrix air meter. Sent refills on maintenance meds and will need to verify with pt who has been prescribing restasis because we never have and is he wanting to use humana glucometer?

## 2015-10-19 NOTE — Telephone Encounter (Signed)
Pt called back stating his eye doctor has been prescribing Restasis and I advised him that he needs to have request sent to them and he voices understanding. Pt also states he is not requesting a new meter as he just got one a few months ago. Spoke with Dorothy Spark. At [facility] and advised her we will not be authorizing restasis or humana glucometer.

## 2015-10-21 ENCOUNTER — Telehealth: Payer: Self-pay | Admitting: Adult Health

## 2015-10-21 ENCOUNTER — Ambulatory Visit: Payer: Commercial Managed Care - HMO | Admitting: Cardiovascular Disease

## 2015-10-21 DIAGNOSIS — H35352 Cystoid macular degeneration, left eye: Secondary | ICD-10-CM | POA: Diagnosis not present

## 2015-10-21 DIAGNOSIS — H35073 Retinal telangiectasis, bilateral: Secondary | ICD-10-CM | POA: Diagnosis not present

## 2015-10-21 DIAGNOSIS — G4731 Primary central sleep apnea: Secondary | ICD-10-CM | POA: Diagnosis not present

## 2015-10-21 DIAGNOSIS — H35351 Cystoid macular degeneration, right eye: Secondary | ICD-10-CM | POA: Diagnosis not present

## 2015-10-21 NOTE — Progress Notes (Signed)
Patient ID: [name] Pratt, male   DOB: 1941/04/20, 75 y.o.   MRN: [id number]     CARDIOLOGY OFFICE NOTE  Date:  10/22/2015    [name] Pratt Date of Birth: November 17, 1940 Medical Record [id number]  PCP:  Nance Pear., NP  Cardiologist:  Johnsie Cancel    No chief complaint on file.   History of Present Illness:  [name] Pratt is a 75 y.o. male   has a history of HLD, GERD, carotid disease, HTN and COPD. Other issues as noted below. Remote echo from 2009 with normal EF. Normal Myoview from 2008.   Presented to the ER  08/2015  - BP had been elevated for about 2 days - given dose of Clonidine. Asked to follow up  F/U Renal Duplex with no RAS   Taking a lot of his meds at night Discussed taking them with meals in day His BP cuff checked and running a lot higher than our cuff  172/80 his 140/70 manual my reading    Past Medical History  Diagnosis Date  . Hyperlipidemia     under control  . Chest pain      Myoview in 2008 was normal.  Echo 7/09: EF 60%, normal wall motion, mild LVH, mild LAE.  . Inguinal hernia   . Pancreatitis   . Rosacea   . Gout   . Arthritis   . History of chicken pox   . GERD (gastroesophageal reflux disease)   . History of hemorrhoids   . OSA (obstructive sleep apnea)   . Carotid stenosis     a. Carotid U/S [id number]: LICA < [id number], RICA 0000000;  b.  Carotid U/S 0000000:  RICA 123456; LICA [id number]; f/u 1 year  . Hypertension     under control  . COPD (chronic obstructive pulmonary disease) ([place])   . Hypothyroidism   . History of kidney stones   . Cancer ([place])     skin cancer  . Complication of anesthesia     "stomach does not wake up"  . Ileus ([place])   . PONV (postoperative nausea and vomiting)     Past Surgical History  Procedure Laterality Date  . Laparoscopic cholecystectomy  09/20/06    with intraoperative cholangiogram and right inguinal herniorrhaphy with mesh   . Hiatal hernia repair  12/21/93  . Knee surgery  1995  . Hemorrhoid  surgery  2014  . Lithotripsy      x2  . Abdominal hernia repair  07/15/12    Dr [name]  . Nasal septum surgery  1975  . Esophageal dilation  1993 and 1994    multiple times  . Brow lift  05/07/01  . Urethral dilation  1991    [name]  . Epidural injections      multiple procedures  . 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 Ligament repair Left 07/2013    shoulder  . Skin cancer destruction      nose, ear  . Inguinal hernia repair Right 07/15/12    Dr [name], x2  . Joint replacement  07/25/04    right knee  . Joint replacement  07/13/10    left knee  . Total hip arthroplasty Left   . Eye surgery Bilateral 03/25/10, 2012    cataract removal  . Refractive surgery Bilateral   . Tonsillectomy  as child  . Cystoscopy with ureteroscopy and stent placement Right 01/28/2014    Procedure: CYSTOSCOPY WITH URETEROSCOPY, BASKET RETRIVAL AND  STENT PLACEMENT;  Surgeon:  [name], MD;  Location: [facility];  Service: Urology;  Laterality: Right;  . Holmium laser application Right [id number]    Procedure: HOLMIUM LASER APPLICATION;  Surgeon: [name], MD;  Location: [facility];  Service: Urology;  Laterality: Right;     Medications: Current Outpatient Prescriptions  Medication Sig Dispense Refill  . acetaminophen (TYLENOL) 500 MG tablet Take 500 mg by mouth every 6 (six) hours as needed for moderate pain.    [name] albuterol (PROVENTIL HFA;VENTOLIN HFA) 108 (90 BASE) MCG/ACT inhaler Inhale 2 puffs into the lungs every 6 (six) hours as needed for shortness of breath.    . allopurinol (ZYLOPRIM) 100 MG tablet Take 1 tablet (100 mg total) by mouth daily. 90 tablet 1  . allopurinol (ZYLOPRIM) 300 MG tablet Take 1 tablet (300 mg total) by mouth daily. 90 tablet 1  . aspirin EC 81 MG tablet Take 81 mg by mouth every morning.    . Carboxymethylcellul-Glycerin (OPTIVE) 0.5-0.9 % SOLN Apply 1 drop to eye daily.    . cycloSPORINE (RESTASIS) 0.05 % ophthalmic emulsion Place 1 drop into both eyes 2 (two) times  daily.    . diclofenac sodium (VOLTAREN) 1 % GEL Apply 1 application topically daily as needed (arthritis). 100 g 0  . diphenhydrAMINE (BENADRYL) 25 MG tablet Take 25 mg by mouth every 6 (six) hours as needed for allergies.    . fluticasone (FLONASE) 50 MCG/ACT nasal spray Place 2 sprays into both nostrils daily. 48 g 1  . gemfibrozil (LOPID) 600 MG tablet TAKE ONE-HALF TABLET BY MOUTH TWICE DAILY BEFORE MEAL(S) 90 tablet 1  . hydrALAZINE (APRESOLINE) 50 MG tablet Take 1 tablet (50 mg total) by mouth 3 (three) times daily. 270 tablet 1  . IRON PO Take 50 mcg by mouth daily.    [name] levothyroxine (SYNTHROID, LEVOTHROID) 150 MCG tablet 1 tab by mouth once daily 6 days a week, 1/2 tab one day a week. 78 tablet 1  . losartan-hydrochlorothiazide (HYZAAR) 100-25 MG tablet Take 1 tablet by mouth daily. 90 tablet 3  . metoprolol succinate (TOPROL-XL) 25 MG 24 hr tablet Take 1 tablet (25 mg total) by mouth daily. 90 tablet 1  . metroNIDAZOLE (METROCREAM) 0.75 % cream Apply 1 application topically daily as needed (rosacia).    . multivitamin (THERAGRAN) per tablet Take 1 tablet by mouth daily.      . Omega-3 Fatty Acids (FISH OIL PO) Take 1 capsule by mouth 2 (two) times daily.    [name] omeprazole (PRILOSEC) 20 MG capsule Take 1 capsule (20 mg total) by mouth every morning. 90 capsule 1  . pravastatin (PRAVACHOL) 40 MG tablet Take 1 tablet (40 mg total) by mouth daily. 90 tablet 1  . sodium chloride (OCEAN) 0.65 % nasal spray Place 1 spray into both nostrils at bedtime as needed for congestion.    . terbinafine (LAMISIL) 250 MG tablet Take 1 tablet (250 mg total) by mouth daily. 30 tablet 1   No current facility-administered medications for this visit.    Allergies: Allergies  Allergen Reactions  . Metoclopramide Hcl     anxiety    Social History: The patient  reports that he quit smoking about 37 years ago. His smoking use included Cigarettes. He quit after 11 years of use. He has never used smokeless  tobacco. He reports that he drinks alcohol. He reports that he does not use illicit drugs.   Family History: The patient's family history includes Cancer in his mother.  Review of Systems: Please see the history of present illness.   Otherwise, the review of systems is positive for none.   All other systems are reviewed and negative.   Physical Exam: VS:  BP 158/82 mmHg  Pulse 59  Ht 5\' 7"  (1.702 m)  Wt 83.462 kg (184 lb)  BMI 28.81 kg/m2 .  BMI Body mass index is 28.81 kg/(m^2).  Wt Readings from Last 3 Encounters:  10/22/15 83.462 kg (184 lb)  09/15/15 83.825 kg (184 lb 12.8 oz)  09/02/15 82.555 kg (182 lb)   BP is   General: Pleasant. Well developed, well nourished and in no acute distress. His weight is increasing.   HEENT: Normal. Neck: Supple Cardiac: Regular rate and rhythm. No murmurs, rubs, or gallops. No edema.  Respiratory:  Lungs are clear to auscultation bilaterally with normal work of breathing.  GI: Soft and nontender. I do not appreciate any bruits.  MS: No deformity or atrophy. Gait and ROM intact. Skin: Warm and dry. Color is normal.  Neuro:  Strength and sensation are intact and no gross focal deficits noted.  Psych: Alert, appropriate and with normal affect.   LABORATORY DATA:  EKG:    Lab Results  Component Value Date   WBC 5.6 08/27/2015   HGB 13.4 08/27/2015   HCT 41.0 08/27/2015   PLT 296 08/27/2015   GLUCOSE 125* 09/15/2015   CHOL 136 09/21/2014   TRIG 135.0 09/21/2014   HDL 28.50* 09/21/2014   LDLCALC 81 09/21/2014   ALT 17 08/02/2015   AST 19 08/02/2015   NA 140 09/15/2015   K 3.6 09/15/2015   CL 105 09/15/2015   CREATININE 1.44* 09/15/2015   BUN 29* 09/15/2015   CO2 21 09/15/2015   TSH 0.39 06/30/2015   PSA 0.31 05/26/2013   INR 1.06 12/11/2012   HGBA1C 5.8 03/22/2015    BNP (last 3 results) No results for input(s): BNP in the last 8760 hours.  ProBNP (last 3 results) No results for input(s): PROBNP in the last 8760  hours.   Other Studies Reviewed Today:   Assessment/Plan: 1. HTN -  Improved on current meds his BP cuff inaccurate adjust time of day taking meds Low sodium diet  2. HLD - on statin therapy.   3. Carotid disease - for repeat study in 05/2016  4. COPD with stable dyspnea.   5. CKD -  Lab Results  Component Value Date   CREATININE 1.44* 09/15/2015   BUN 29* 09/15/2015   NA 140 09/15/2015   K 3.6 09/15/2015   CL 105 09/15/2015   CO2 21 09/15/2015     Current medicines are reviewed with the patient today.  The patient does not have concerns regarding medicines other than what has been noted above.  The following changes have been made:  See above.  Labs/ tests ordered today include:    No orders of the defined types were placed in this encounter.     Disposition:      Patient is agreeable to this plan and will call if any problems develop in the interim.   Jenkins Rouge

## 2015-10-21 NOTE — Telephone Encounter (Signed)
I called and they will send copy of cpap dl to [phone].

## 2015-10-21 NOTE — Telephone Encounter (Signed)
I called and gave the cpap report to pt that per Dr.Dohmeier, stated looks good.  He stated that his retina doctor made comment that his exam looked better, pt questioning if this was due to changes in cpap.  He has new card.  Will see in f/u in June 2017.

## 2015-10-21 NOTE — Telephone Encounter (Signed)
Patient called to advise he took chip into [facility] over 2 weeks ago to be downloaded and states he hasn't heard anything back. Please call with download results.

## 2015-10-22 ENCOUNTER — Ambulatory Visit (INDEPENDENT_AMBULATORY_CARE_PROVIDER_SITE_OTHER): Payer: Commercial Managed Care - HMO | Admitting: Cardiovascular Disease

## 2015-10-22 ENCOUNTER — Other Ambulatory Visit: Payer: Self-pay

## 2015-10-22 ENCOUNTER — Encounter: Payer: Self-pay | Admitting: Cardiovascular Disease

## 2015-10-22 VITALS — BP 140/70 | HR 59 | Ht 67.0 in | Wt 184.0 lb

## 2015-10-22 DIAGNOSIS — I10 Essential (primary) hypertension: Secondary | ICD-10-CM

## 2015-10-22 NOTE — Patient Instructions (Signed)

## 2015-10-25 MED ORDER — ALLOPURINOL 100 MG PO TABS: 100.0000 mg | ORAL_TABLET | Freq: Every day | ORAL | Status: DC

## 2015-10-25 MED ORDER — METOPROLOL SUCCINATE ER 25 MG PO TB24: 12.5000 mg | ORAL_TABLET | Freq: Two times a day (BID) | ORAL | Status: DC

## 2015-10-25 NOTE — Addendum Note (Signed)
Addended by: Kelle Darting A on: 10/25/2015 04:49 PM   Modules accepted: Orders

## 2015-10-25 NOTE — Telephone Encounter (Signed)
Pt states [facility] is holding the RXs waiting on info from our office that was faxed to [place]. Pt almost out. Please call and see if they can expedite order or may need to send some to local pharmacy [id number] - [place], [place])

## 2015-10-25 NOTE — Telephone Encounter (Addendum)
Spoke with Hinton Dyer at [facility] and she states they are not waiting on anything from [place] and meds that we prescribed were shipped out today. Attempted to notify pt and informed his spouse and to let us know if he is out of any of his medications. She will have pt call us if he needs anything else.

## 2015-10-25 NOTE — Telephone Encounter (Signed)
Pt called back and needs 2 week supply of allopurinol 100mg  and metoprolol 25 mg 1/2 tablet twice a day to [facility]. Rxs sent.

## 2015-10-26 ENCOUNTER — Other Ambulatory Visit: Payer: Self-pay | Admitting: Family

## 2015-10-26 NOTE — Telephone Encounter (Signed)
Spoke with pharmacist. He states that Gemfibrozil is not an automated request. Sent 2 week supply as I just sent this to pt's mail order last week.

## 2015-11-22 ENCOUNTER — Telehealth: Payer: Self-pay | Admitting: Family

## 2015-11-22 NOTE — Telephone Encounter (Signed)
[facility] referral # [id number]

## 2015-11-22 NOTE — Telephone Encounter (Signed)
Pt called to check the status of the referral.

## 2015-11-22 NOTE — Telephone Encounter (Signed)
Delsa Sale or Marj-- [facility] referral was already placed in 10/15/15, can you do a [facility] referral?  Thanks!

## 2015-11-22 NOTE — Telephone Encounter (Signed)
Pt called in because he says that he has an appt with OPHTHALMOLOGY (Dr. Radene Ou) for in the morning. Pt says that they just informed him that he need a referral. Pt would like to have a referral so that he can be seen.

## 2015-11-23 ENCOUNTER — Other Ambulatory Visit (HOSPITAL_BASED_OUTPATIENT_CLINIC_OR_DEPARTMENT_OTHER): Payer: Self-pay | Admitting: Ophthalmology

## 2015-11-23 DIAGNOSIS — H4922 Sixth [abducent] nerve palsy, left eye: Secondary | ICD-10-CM | POA: Diagnosis not present

## 2015-11-23 DIAGNOSIS — H35073 Retinal telangiectasis, bilateral: Secondary | ICD-10-CM | POA: Diagnosis not present

## 2015-11-23 DIAGNOSIS — H532 Diplopia: Secondary | ICD-10-CM | POA: Diagnosis not present

## 2015-11-23 DIAGNOSIS — H35352 Cystoid macular degeneration, left eye: Secondary | ICD-10-CM | POA: Diagnosis not present

## 2015-11-23 NOTE — Telephone Encounter (Signed)
Pt aware.

## 2015-11-24 ENCOUNTER — Encounter: Payer: Self-pay | Admitting: Family

## 2015-11-24 ENCOUNTER — Ambulatory Visit (INDEPENDENT_AMBULATORY_CARE_PROVIDER_SITE_OTHER): Payer: Commercial Managed Care - HMO | Admitting: Family

## 2015-11-24 ENCOUNTER — Telehealth: Payer: Self-pay | Admitting: *Deleted

## 2015-11-24 ENCOUNTER — Ambulatory Visit (HOSPITAL_BASED_OUTPATIENT_CLINIC_OR_DEPARTMENT_OTHER)
Admission: RE | Admit: 2015-11-24 | Discharge: 2015-11-24 | Disposition: A | Discharge status: Home / Self Care | Payer: Commercial Managed Care - HMO | Source: Ambulatory Visit | Attending: Ophthalmology | Admitting: Ophthalmology

## 2015-11-24 VITALS — BP 160/78 | HR 66 | Temp 98.5°F | Ht 66.5 in | Wt 183.0 lb

## 2015-11-24 DIAGNOSIS — D649 Anemia, unspecified: Secondary | ICD-10-CM

## 2015-11-24 DIAGNOSIS — H4922 Sixth [abducent] nerve palsy, left eye: Secondary | ICD-10-CM | POA: Diagnosis not present

## 2015-11-24 DIAGNOSIS — I10 Essential (primary) hypertension: Secondary | ICD-10-CM

## 2015-11-24 DIAGNOSIS — R0789 Other chest pain: Secondary | ICD-10-CM

## 2015-11-24 DIAGNOSIS — E039 Hypothyroidism, unspecified: Secondary | ICD-10-CM | POA: Diagnosis not present

## 2015-11-24 DIAGNOSIS — R739 Hyperglycemia, unspecified: Secondary | ICD-10-CM

## 2015-11-24 DIAGNOSIS — Z0000 Encounter for general adult medical examination without abnormal findings: Secondary | ICD-10-CM | POA: Diagnosis not present

## 2015-11-24 DIAGNOSIS — G4733 Obstructive sleep apnea (adult) (pediatric): Secondary | ICD-10-CM

## 2015-11-24 DIAGNOSIS — E785 Hyperlipidemia, unspecified: Secondary | ICD-10-CM | POA: Diagnosis not present

## 2015-11-24 DIAGNOSIS — Z9989 Dependence on other enabling machines and devices: Secondary | ICD-10-CM

## 2015-11-24 DIAGNOSIS — R079 Chest pain, unspecified: Secondary | ICD-10-CM | POA: Diagnosis not present

## 2015-11-24 DIAGNOSIS — K219 Gastro-esophageal reflux disease without esophagitis: Secondary | ICD-10-CM

## 2015-11-24 DIAGNOSIS — H9191 Unspecified hearing loss, right ear: Secondary | ICD-10-CM

## 2015-11-24 DIAGNOSIS — I6523 Occlusion and stenosis of bilateral carotid arteries: Secondary | ICD-10-CM

## 2015-11-24 DIAGNOSIS — H538 Other visual disturbances: Secondary | ICD-10-CM | POA: Diagnosis not present

## 2015-11-24 DIAGNOSIS — M109 Gout, unspecified: Secondary | ICD-10-CM

## 2015-11-24 HISTORY — DX: Encounter for general adult medical examination without abnormal findings: Z00.00

## 2015-11-24 HISTORY — DX: Other chest pain: R07.89

## 2015-11-24 LAB — CBC WITH DIFFERENTIAL/PLATELET
BASOS ABS: 0 10*3/uL (ref 0.0–0.1)
BASOS PCT: 0.5 % (ref 0.0–3.0)
EOS ABS: 0.1 10*3/uL (ref 0.0–0.7)
Eosinophils Relative: 2.5 % (ref 0.0–5.0)
HEMATOCRIT: 41.5 % (ref 39.0–52.0)
HEMOGLOBIN: 13.9 g/dL (ref 13.0–17.0)
LYMPHS PCT: 34 % (ref 12.0–46.0)
Lymphs Abs: 1.9 10*3/uL (ref 0.7–4.0)
MCHC: 33.4 g/dL (ref 30.0–36.0)
MCV: 97.4 fl (ref 78.0–100.0)
MONOS PCT: 12.3 % — AB (ref 3.0–12.0)
Monocytes Absolute: 0.7 10*3/uL (ref 0.1–1.0)
NEUTROS ABS: 2.8 10*3/uL (ref 1.4–7.7)
Neutrophils Relative %: 50.7 % (ref 43.0–77.0)
Platelets: 346 10*3/uL (ref 150.0–400.0)
RBC: 4.26 Mil/uL (ref 4.22–5.81)
RDW: 14.8 % (ref 11.5–15.5)
WBC: 5.6 10*3/uL (ref 4.0–10.5)

## 2015-11-24 LAB — LIPID PANEL
CHOL/HDL RATIO: 4
Cholesterol: 142 mg/dL (ref 0–200)
HDL: 32.5 mg/dL — ABNORMAL LOW (ref >39.00)
LDL CALC: 77 mg/dL (ref 0–99)
NonHDL: 109.16
TRIGLYCERIDES: 162 mg/dL — AB (ref 0.0–149.0)
VLDL: 32.4 mg/dL (ref 0.0–40.0)

## 2015-11-24 LAB — COMPREHENSIVE METABOLIC PANEL
ALBUMIN: 4.6 g/dL (ref 3.5–5.2)
ALT: 24 U/L (ref 0–53)
AST: 26 U/L (ref 0–37)
Alkaline Phosphatase: 60 U/L (ref 39–117)
BILIRUBIN TOTAL: 0.5 mg/dL (ref 0.2–1.2)
BUN: 29 mg/dL — ABNORMAL HIGH (ref 6–23)
CALCIUM: 9.7 mg/dL (ref 8.4–10.5)
CHLORIDE: 102 meq/L (ref 96–112)
CO2: 29 meq/L (ref 19–32)
CREATININE: 1.29 mg/dL (ref 0.40–1.50)
GFR: 57.59 mL/min — AB (ref >60.00)
Glucose, Bld: 124 mg/dL — ABNORMAL HIGH (ref 70–99)
Potassium: 3.6 mEq/L (ref 3.5–5.1)
Sodium: 139 mEq/L (ref 135–145)
Total Protein: 8.2 g/dL (ref 6.0–8.3)

## 2015-11-24 LAB — TSH: TSH: 0.92 u[IU]/mL (ref 0.35–4.50)

## 2015-11-24 LAB — HEMOGLOBIN A1C: Hgb A1c MFr Bld: 6.1 % (ref 4.6–6.5)

## 2015-11-24 MED ORDER — METOPROLOL SUCCINATE ER 25 MG PO TB24: 25.0000 mg | ORAL_TABLET | Freq: Two times a day (BID) | ORAL | Status: DC

## 2015-11-24 NOTE — Progress Notes (Signed)
Pre visit review using our clinic review tool, if applicable. No additional management support is needed unless otherwise documented below in the visit note. 

## 2015-11-24 NOTE — Assessment & Plan Note (Signed)
Uncontrolled. Increase metoprolol from 12.5 to 25mg  bid.

## 2015-11-24 NOTE — Assessment & Plan Note (Signed)
Clinically stable. Continue asa, lipid and bp control.

## 2015-11-24 NOTE — Assessment & Plan Note (Signed)
Stable on synthroid.  Obtain tsh 

## 2015-11-24 NOTE — Telephone Encounter (Signed)
Received medical records from [facility] for patient's 10:000am appointment; forwarded to provider/SLS 03/15

## 2015-11-24 NOTE — Progress Notes (Signed)
Subjective:    [name] Pratt is a 75 y.o. male who presents for Medicare Annual/Subsequent preventive examination.   Preventive Screening-Counseling & Management  Tobacco History  Smoking status  . Former Smoker -- 11 years  . Types: Cigarettes  . Quit date: 09/11/1978  Smokeless tobacco  . Never Used    Problems Prior to Visit 1. Vision Problem- reviewed notes from his Opthalmologist- Dr. Zadie Rhine.  He saw him on 11/23/15 with c/o double vision OU when he looks laterally. Does note some blurred vision.  Dr.  Zadie Rhine was concerned about his blood pressure and advised him to come see [place] for labs and BP evaluation. 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 He was note dto have stable right eye retinal hemorrhage/telangiectasis.  He is scheduled for a CT head today per Dr. Zadie Rhine.  2.  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 He is continues aspirin 81mg  daily.   3. HTN- on hyzaar, toprol xl 12.5mg  bid,  Hydralazine. Reports intermittent left sided chest pain.  BP Readings from Last 3 Encounters:  11/24/15 160/78  10/22/15 140/70  09/15/15 140/84   4. GERD- on prilosec. Stable, no symptoms.  5. Hyperlipidemia- on pravastatin/gemfibrozil.  Lab Results  Component Value Date   CHOL 136 09/21/2014   HDL 28.50* 09/21/2014   LDLCALC 81 09/21/2014   TRIG 135.0 09/21/2014   CHOLHDL 5 09/21/2014   6. OSA on CPAP- reports good compliance  7. RLS- not on meds. Reports stable.  8. Gout-  Denies recent flares, continues allopurinol.   He does not some intermittent CP. CP is substernal and left sided. Only occurs at rest, does not occur with exercise. Lasts about 1 minute. No current CP.  Immunizations: up to date Diet: reports good appetite.  Could make better choices.  Exercise: no recent exercise Colonoscopy: 2014- normal    Current Problems (verified) Patient Active Problem List   Diagnosis Date Noted  .  Onychomycosis 06/30/2015  . Degenerative disc disease, lumbar 06/30/2015  . Other allergic rhinitis 02/18/2015  . Deviated nasal septum 02/18/2015  . OSA on CPAP 02/18/2015  . RLS (restless legs syndrome) 02/18/2015  . GERD (gastroesophageal reflux disease) 09/18/2014  . Hyperglycemia 03/03/2014  . Sleep apnea with use of continuous positive airway pressure (CPAP) 07/15/2013  . Carotid stenosis 02/24/2013  . Nonspecific abnormal electrocardiogram (ECG) (EKG) 01/06/2013  . DJD (degenerative joint disease) of hip 12/24/2012  . Lower GI bleed 12/12/2012  . Dyspnea 06/12/2011  . Hypothyroidism 05/13/2009  . HYPERCHOLESTEROLEMIA 05/13/2009  . Hyperlipidemia 05/13/2009  . 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  . Essential hypertension 05/13/2009  . INGUINAL HERNIA 05/13/2009  . HIATAL HERNIA 05/13/2009  . NEPHROLITHIASIS 05/13/2009  . ROSACEA 05/13/2009    Medications Prior to Visit Current Outpatient Prescriptions on File Prior to Visit  Medication Sig Dispense Refill  . acetaminophen (TYLENOL) 500 MG tablet Take 500 mg by mouth every 6 (six) hours as needed for moderate pain.    [name] albuterol (PROVENTIL HFA;VENTOLIN HFA) 108 (90 BASE) MCG/ACT inhaler Inhale 2 puffs into the lungs every 6 (six) hours as needed for shortness of breath.    . allopurinol (ZYLOPRIM) 100 MG tablet Take 1 tablet (100 mg total) by mouth daily. 14 tablet 0  . allopurinol (ZYLOPRIM) 300 MG tablet Take 1 tablet (300 mg total) by mouth daily. 90 tablet 1  . aspirin EC 81 MG tablet Take 81 mg by mouth every morning.    [name]  Carboxymethylcellul-Glycerin (OPTIVE) 0.5-0.9 % SOLN Apply 1 drop to eye daily.    . cycloSPORINE (RESTASIS) 0.05 % ophthalmic emulsion Place 1 drop into both eyes 2 (two) times daily.    . diclofenac sodium (VOLTAREN) 1 % GEL Apply 1 application topically daily as needed (arthritis). 100 g 0  . diphenhydrAMINE (BENADRYL) 25 MG tablet Take 25 mg by mouth every 6 (six) hours as needed for allergies.    . fluticasone  (FLONASE) 50 MCG/ACT nasal spray Place 2 sprays into both nostrils daily. 48 g 1  . gemfibrozil (LOPID) 600 MG tablet TAKE ONE-HALF TABLET BY MOUTH TWICE DAILY BEFORE  MEALS 14 tablet 0  . hydrALAZINE (APRESOLINE) 50 MG tablet Take 1 tablet (50 mg total) by mouth 3 (three) times daily. 270 tablet 1  . IRON PO Take 50 mcg by mouth daily.    [name] levothyroxine (SYNTHROID, LEVOTHROID) 150 MCG tablet 1 tab by mouth once daily 6 days a week, 1/2 tab one day a week. 78 tablet 1  . losartan-hydrochlorothiazide (HYZAAR) 100-25 MG tablet Take 1 tablet by mouth daily. 90 tablet 3  . metoprolol succinate (TOPROL-XL) 25 MG 24 hr tablet Take 0.5 tablets (12.5 mg total) by mouth 2 (two) times daily. 14 tablet 0  . metroNIDAZOLE (METROCREAM) 0.75 % cream Apply 1 application topically daily as needed (rosacia).    . multivitamin (THERAGRAN) per tablet Take 1 tablet by mouth daily.      . Omega-3 Fatty Acids (FISH OIL PO) Take 1 capsule by mouth 2 (two) times daily.    [name] omeprazole (PRILOSEC) 20 MG capsule Take 1 capsule (20 mg total) by mouth every morning. 90 capsule 1  . pravastatin (PRAVACHOL) 40 MG tablet Take 1 tablet (40 mg total) by mouth daily. 90 tablet 1  . sodium chloride (OCEAN) 0.65 % nasal spray Place 1 spray into both nostrils at bedtime as needed for congestion.    . terbinafine (LAMISIL) 250 MG tablet Take 1 tablet (250 mg total) by mouth daily. 30 tablet 1   No current facility-administered medications on file prior to visit.    Current Medications (verified) Current Outpatient Prescriptions  Medication Sig Dispense Refill  . acetaminophen (TYLENOL) 500 MG tablet Take 500 mg by mouth every 6 (six) hours as needed for moderate pain.    [name] albuterol (PROVENTIL HFA;VENTOLIN HFA) 108 (90 BASE) MCG/ACT inhaler Inhale 2 puffs into the lungs every 6 (six) hours as needed for shortness of breath.    . allopurinol (ZYLOPRIM) 100 MG tablet Take 1 tablet (100 mg total) by mouth daily. 14 tablet 0  .  allopurinol (ZYLOPRIM) 300 MG tablet Take 1 tablet (300 mg total) by mouth daily. 90 tablet 1  . aspirin EC 81 MG tablet Take 81 mg by mouth every morning.    . Carboxymethylcellul-Glycerin (OPTIVE) 0.5-0.9 % SOLN Apply 1 drop to eye daily.    . cycloSPORINE (RESTASIS) 0.05 % ophthalmic emulsion Place 1 drop into both eyes 2 (two) times daily.    . diclofenac sodium (VOLTAREN) 1 % GEL Apply 1 application topically daily as needed (arthritis). 100 g 0  . diphenhydrAMINE (BENADRYL) 25 MG tablet Take 25 mg by mouth every 6 (six) hours as needed for allergies.    . fluticasone (FLONASE) 50 MCG/ACT nasal spray Place 2 sprays into both nostrils daily. 48 g 1  . gemfibrozil (LOPID) 600 MG tablet TAKE ONE-HALF TABLET BY MOUTH TWICE DAILY BEFORE  MEALS 14 tablet 0  . hydrALAZINE (APRESOLINE) 50 MG  tablet Take 1 tablet (50 mg total) by mouth 3 (three) times daily. 270 tablet 1  . IRON PO Take 50 mcg by mouth daily.    [name] levothyroxine (SYNTHROID, LEVOTHROID) 150 MCG tablet 1 tab by mouth once daily 6 days a week, 1/2 tab one day a week. 78 tablet 1  . losartan-hydrochlorothiazide (HYZAAR) 100-25 MG tablet Take 1 tablet by mouth daily. 90 tablet 3  . metoprolol succinate (TOPROL-XL) 25 MG 24 hr tablet Take 0.5 tablets (12.5 mg total) by mouth 2 (two) times daily. 14 tablet 0  . metroNIDAZOLE (METROCREAM) 0.75 % cream Apply 1 application topically daily as needed (rosacia).    . multivitamin (THERAGRAN) per tablet Take 1 tablet by mouth daily.      . Omega-3 Fatty Acids (FISH OIL PO) Take 1 capsule by mouth 2 (two) times daily.    [name] omeprazole (PRILOSEC) 20 MG capsule Take 1 capsule (20 mg total) by mouth every morning. 90 capsule 1  . pravastatin (PRAVACHOL) 40 MG tablet Take 1 tablet (40 mg total) by mouth daily. 90 tablet 1  . sodium chloride (OCEAN) 0.65 % nasal spray Place 1 spray into both nostrils at bedtime as needed for congestion.    . terbinafine (LAMISIL) 250 MG tablet Take 1 tablet (250 mg total)  by mouth daily. 30 tablet 1   No current facility-administered medications for this visit.     Allergies (verified) Metoclopramide hcl   PAST HISTORY  Family History Family History  Problem Relation Age of Onset  . Hypertension    . Cancer    . Osteoarthritis    . Cancer Mother     Social History Social History  Substance Use Topics  . Smoking status: Former Smoker -- 11 years    Types: Cigarettes    Quit date: 09/11/1978  . Smokeless tobacco: Never Used  . Alcohol Use: Yes     Comment: rare    Are there smokers in your home (other than you)?  Yes- wife smokes  Risk Factors Current exercise habits: not exercising  Dietary issues discussed: need to improve diet   Cardiac risk factors: advanced age (older than 79 for men, 37 for women), dyslipidemia, hypertension, male gender, sedentary lifestyle and smoking/ tobacco exposure.  Depression Screen (Note: if answer to either of the following is "Yes", a more complete depression screening is indicated)   Q1: Over the past two weeks, have you felt down, depressed or hopeless? No  Q2: Over the past two weeks, have you felt little interest or pleasure in doing things? No  Have you lost interest or pleasure in daily life? No  Do you often feel hopeless? No  Do you cry easily over simple problems? No  Activities of Daily Living In your present state of health, do you have any difficulty performing the following activities?:  Driving? No Managing money?  No Feeding yourself? No Getting from bed to chair? No Climbing a flight of stairs? No Preparing food and eating?: No Bathing or showering? No Getting dressed: No Getting to the toilet? No Using the toilet:No Moving around from place to place: No In the past year have you fallen or had a near fall?:No   Are you sexually active?  Yes  Do you have more than one partner?  No  Hearing Difficulties: R ear hearing has been "down for a long time." Do you often ask people  to speak up or repeat themselves? No Do you experience ringing or noises in  your ears? Rarely Do you have difficulty understanding soft or whispered voices? sometimes   Do you feel that you have a problem with memory? No  Do you often misplace items? No  Do you feel safe at home?  yes  Cognitive Testing  Alert? Yes  Normal Appearance?Yes  Oriented to person? Yes  Place? Yes   Time? Yes   Recall of three objects?  Yes  Can perform simple calculations? Yes  Displays appropriate judgment?Yes  Can read the correct time from a watch face?Yes   Advanced Directives have been discussed with the patient? No   List the Names of Other Physician/Practitioners you currently use: See Care Team  Indicate any recent Medical Services you may have received from other than [facility] providers in the past year (date may be approximate).  Immunization History  Administered Date(s) Administered  . Influenza Split 05/17/2012  . Influenza,inj,Quad PF,36+ Mos 05/26/2013, 06/03/2014, 05/28/2015  . Pneumococcal Conjugate-13 08/25/2013  . Pneumococcal Polysaccharide-23 09/11/2008  . Tdap 12/27/2010  . Zoster 07/10/2008    Screening Tests Health Maintenance  Topic Date Due  . INFLUENZA VACCINE  04/11/2016  . TETANUS/TDAP  12/26/2020  . COLONOSCOPY  12/24/2022  . ZOSTAVAX  Completed  . PNA vac Low Risk Adult  Completed    All answers were reviewed with the patient and necessary referrals were made:  O'SULLIVAN,Doug Bucklin S., NP   11/24/2015   History reviewed: allergies, current medications, past family history, past medical history, past social history, past surgical history and problem list  Review of Systems Pertinent items are noted in HPI.    Past Medical History  Diagnosis Date  . Hyperlipidemia     under control  . Chest pain      Myoview in 2008 was normal.  Echo 7/09: EF 60%, normal wall motion, mild LVH, mild LAE.  . Inguinal hernia   . Pancreatitis   . Rosacea   . Gout   .  Arthritis   . History of chicken pox   . GERD (gastroesophageal reflux disease)   . History of hemorrhoids   . OSA (obstructive sleep apnea)   . Carotid stenosis     a. Carotid U/S [id number]: LICA < [id number], RICA 0000000;  b.  Carotid U/S 0000000:  RICA 123456; LICA XX123456; f/u 1 year  . Hypertension     under control  . COPD (chronic obstructive pulmonary disease) ([place])   . Hypothyroidism   . History of kidney stones   . Cancer ([place])     skin cancer  . Complication of anesthesia     "stomach does not wake up"  . Ileus ([place])   . PONV (postoperative nausea and vomiting)     Social History   Social History  . Marital Status: Married    Spouse Name: N/A  . Number of Children: 1  . 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 Smoking status: Former Smoker -- 11 years    Types: Cigarettes    Quit date: 09/11/1978  . Smokeless tobacco: Never Used  . Alcohol Use: Yes     Comment: rare  . Drug Use: No  . Sexual Activity: Not on file   Other Topics Concern  . Not on file   Social History Narrative   Lives with his wife   He has one son- lives locally.   2 grandchildren   Has  worked for Research officer, trade [employer]   Part time pain business   Enjoys wood working   Completed HS.  [employer]    Past Surgical History  Procedure Laterality Date  . Laparoscopic cholecystectomy  09/20/06    with intraoperative cholangiogram and right inguinal herniorrhaphy with mesh   . Hiatal hernia repair  12/21/93  . Knee surgery  1995  . Hemorrhoid surgery  2014  . Lithotripsy      x2  . Abdominal hernia repair  07/15/12    Dr [name]  . Nasal septum surgery  1975  . Esophageal dilation  1993 and 1994    multiple times  . Brow lift  05/07/01  . Urethral dilation  1991    [name]  . Epidural injections      multiple procedures  . 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 Ligament repair Left 07/2013    shoulder  . Skin cancer  destruction      nose, ear  . Inguinal hernia repair Right 07/15/12    Dr [name], x2  . Joint replacement  07/25/04    right knee  . Joint replacement  07/13/10    left knee  . Total hip arthroplasty Left   . Eye surgery Bilateral 03/25/10, 2012    cataract removal  . Refractive surgery Bilateral   . Tonsillectomy  as child  . Cystoscopy with ureteroscopy and stent placement Right 01/28/2014    Procedure: CYSTOSCOPY WITH URETEROSCOPY, BASKET RETRIVAL AND  STENT PLACEMENT;  Surgeon: [name], MD;  Location: [facility];  Service: Urology;  Laterality: Right;  . Holmium laser application Right [id number]    Procedure: HOLMIUM LASER APPLICATION;  Surgeon: [name], MD;  Location: [facility];  Service: Urology;  Laterality: Right;    Family History  Problem Relation Age of Onset  . Hypertension    . Cancer    . Osteoarthritis    . Cancer Mother     Allergies  Allergen Reactions  . Metoclopramide Hcl     anxiety    Current Outpatient Prescriptions on File Prior to Visit  Medication Sig Dispense Refill  . acetaminophen (TYLENOL) 500 MG tablet Take 500 mg by mouth every 6 (six) hours as needed for moderate pain.    [name] albuterol (PROVENTIL HFA;VENTOLIN HFA) 108 (90 BASE) MCG/ACT inhaler Inhale 2 puffs into the lungs every 6 (six) hours as needed for shortness of breath.    . allopurinol (ZYLOPRIM) 100 MG tablet Take 1 tablet (100 mg total) by mouth daily. 14 tablet 0  . allopurinol (ZYLOPRIM) 300 MG tablet Take 1 tablet (300 mg total) by mouth daily. 90 tablet 1  . aspirin EC 81 MG tablet Take 81 mg by mouth every morning.    . Carboxymethylcellul-Glycerin (OPTIVE) 0.5-0.9 % SOLN Apply 1 drop to eye daily.    . cycloSPORINE (RESTASIS) 0.05 % ophthalmic emulsion Place 1 drop into both eyes 2 (two) times daily.    . diclofenac sodium (VOLTAREN) 1 % GEL Apply 1 application topically daily as needed (arthritis). 100 g 0  . diphenhydrAMINE (BENADRYL) 25 MG tablet Take 25 mg by mouth every 6  (six) hours as needed for allergies.    . fluticasone (FLONASE) 50 MCG/ACT nasal spray Place 2 sprays into both nostrils daily. 48 g 1  . gemfibrozil (LOPID) 600 MG tablet TAKE ONE-HALF TABLET BY MOUTH TWICE DAILY BEFORE  MEALS 14 tablet 0  . hydrALAZINE (APRESOLINE) 50 MG tablet Take 1 tablet (50  mg total) by mouth 3 (three) times daily. 270 tablet 1  . IRON PO Take 50 mcg by mouth daily.    [name] levothyroxine (SYNTHROID, LEVOTHROID) 150 MCG tablet 1 tab by mouth once daily 6 days a week, 1/2 tab one day a week. 78 tablet 1  . losartan-hydrochlorothiazide (HYZAAR) 100-25 MG tablet Take 1 tablet by mouth daily. 90 tablet 3  . metroNIDAZOLE (METROCREAM) 0.75 % cream Apply 1 application topically daily as needed (rosacia).    . multivitamin (THERAGRAN) per tablet Take 1 tablet by mouth daily.      . Omega-3 Fatty Acids (FISH OIL PO) Take 1 capsule by mouth 2 (two) times daily.    [name] omeprazole (PRILOSEC) 20 MG capsule Take 1 capsule (20 mg total) by mouth every morning. 90 capsule 1  . pravastatin (PRAVACHOL) 40 MG tablet Take 1 tablet (40 mg total) by mouth daily. 90 tablet 1  . sodium chloride (OCEAN) 0.65 % nasal spray Place 1 spray into both nostrils at bedtime as needed for congestion.    . terbinafine (LAMISIL) 250 MG tablet Take 1 tablet (250 mg total) by mouth daily. 30 tablet 1   No current facility-administered medications on file prior to visit.    BP 160/78 mmHg  Pulse 66  Temp(Src) 98.5 F (36.9 C) (Oral)  Ht 5' 6.5" (1.689 m)  Wt 183 lb (83.008 kg)  BMI 29.10 kg/m2  SpO2 97%    Objective:     Eye exam deferred because pt had eye exam yesterday with retina specialist. Blood pressure 160/78, pulse 66, temperature 98.5 F (36.9 C), temperature source Oral, height 5' 6.5" (1.689 m), weight 183 lb (83.008 kg), SpO2 97 %. Body mass index is 29.1 kg/(m^2).   Physical Exam  Constitutional: He is oriented to person, place, and time. He appears well-developed and well-nourished.  No distress.  HENT:  Head: Normocephalic and atraumatic.  Right Ear: Tympanic membrane and ear canal normal.  Left Ear: Tympanic membrane and ear canal normal.  Mouth/Throat: Oropharynx is clear and moist.  Eyes: Pupils are equal, round, and reactive to light. No scleral icterus. EOM intact Neck: Normal range of motion. No thyromegaly present.  Cardiovascular: Normal rate and regular rhythm.   No murmur heard. Pulmonary/Chest: Effort normal and breath sounds normal. No respiratory distress. He has no wheezes. He has no rales. He exhibits no tenderness.  Abdominal: Soft. Bowel sounds are normal. He exhibits no distension and no mass. There is no tenderness. There is no rebound and no guarding.  Musculoskeletal: He exhibits no edema.  Lymphadenopathy:    He has no cervical adenopathy.  Neurological: He is alert and oriented to person, place, and time.  He exhibits normal muscle tone. Coordination normal.  Skin: Skin is warm. Dry skin is noted. Psychiatric: He has a normal mood and affect. His behavior is normal. Judgment and thought content normal.          Assessment & Plan:       Assessment:          Plan:     During the course of the visit the patient was educated and counseled about appropriate screening and preventive services including:    Nutrition counseling   Advanced directives: pt given HCPOA and MOST form.  Pt will review and consider.    Diet review for nutrition referral? Yes ____  Not Indicated _x___   Patient Instructions (the written plan) was given to the patient.  Medicare Attestation I have personally  reviewed: The patient's medical and social history Their use of alcohol, tobacco or illicit drugs Their current medications and supplements The patient's functional ability including ADLs,fall risks, home safety risks, cognitive, and hearing and visual impairment Diet and physical activities Evidence for depression or mood disorders  The  patient's weight, height, BMI, and visual acuity have been recorded in the chart.  I have made referrals, counseling, and provided education to the patient based on review of the above and I have provided the patient with a written personalized care plan for preventive services.     Nance Pear., NP   11/24/2015      Patient ID: [name], male   DOB: 04-01-1941, 75 y.o.   MRN: [id number]

## 2015-11-24 NOTE — Assessment & Plan Note (Signed)
Stable on allopurinol, continue same.  

## 2015-11-24 NOTE — Patient Instructions (Signed)
Please complete lab work prior to leaving. Go to ER if you develop recurrent chest pain. Please review [place] form and MOST form.   Increase your metoprolol from 1/2 tab twice daily to a full tab twice daily.

## 2015-11-24 NOTE — Assessment & Plan Note (Addendum)
New problem to me. He states he has had similar pain in the past and has discussed with cardiology.  EKG without acute changes today. Will forward to Dr. Johnsie Cancel and notify him in case he wishes to do additional work up/stress test. He is advised to go to the ER if he develops severe or recurrent chest pain.

## 2015-11-24 NOTE — Progress Notes (Deleted)
   Subjective:    Patient ID: [name] Pratt, male    DOB: October 30, 1940, 75 y.o.   MRN: [id number]  HPI    Review of Systems     Objective:   Physical Exam        Assessment & Plan:

## 2015-11-24 NOTE — Assessment & Plan Note (Signed)
Stable on cpap, continue same.

## 2015-11-24 NOTE — Assessment & Plan Note (Signed)
>>  ASSESSMENT AND PLAN FOR BENIGN ESSENTIAL HYPERTENSION WRITTEN ON 11/24/2015  1:19 PM BY O'SULLIVAN, Annalee Meyerhoff, NP  Uncontrolled. Increase metoprolol from 12.5 to 25mg  bid.

## 2015-11-24 NOTE — Addendum Note (Signed)
Addended by: [name] on: 11/24/2015 03:00 PM   Modules accepted: Miquel Dunn

## 2015-11-24 NOTE — Assessment & Plan Note (Signed)
immunizations reviewed and up to date. Discussed diet/exercise.  Obtain routine lab work.   Lab Results  Component Value Date   PSA 0.31 05/26/2013

## 2015-11-24 NOTE — Assessment & Plan Note (Addendum)
Lab Results  Component Value Date   CHOL 142 11/24/2015   HDL 32.50* 11/24/2015   LDLCALC 77 11/24/2015   TRIG 162.0* 11/24/2015   CHOLHDL 4 11/24/2015  LDL at goal per follow up lipid panel.  Continue statin and gemfibrozil.

## 2015-11-24 NOTE — Assessment & Plan Note (Signed)
Stable on PPI, continue same.  

## 2015-11-30 ENCOUNTER — Telehealth: Payer: Self-pay | Admitting: *Deleted

## 2015-11-30 NOTE — Telephone Encounter (Signed)
-----   Message from [name], NP sent at 11/24/2015 12:58 PM EDT ----- Could you please fax note and labs from today when complete to Dr. Zadie Rhine, attn: crystal S [phone]

## 2015-11-30 NOTE — Telephone Encounter (Signed)
11/24/15 office note and labs faxed to below #.

## 2015-12-03 ENCOUNTER — Telehealth: Payer: Self-pay | Admitting: Family

## 2015-12-03 DIAGNOSIS — H4920 Sixth [abducent] nerve palsy, unspecified eye: Secondary | ICD-10-CM

## 2015-12-03 MED ORDER — ALBUTEROL SULFATE HFA 108 (90 BASE) MCG/ACT IN AERS: 2.0000 | INHALATION_SPRAY | Freq: Four times a day (QID) | RESPIRATORY_TRACT | Status: DC | PRN

## 2015-12-03 NOTE — Telephone Encounter (Signed)
Refill sent, notified pt. Please advise re: EKG. Pt also states that he continues to have double vision. His eye doctor told him CT showed no cause for his impairment and to follow up if vision becomes worse or as scheduled for August. Pt would like you opinion regarding his CT and double vision.  Please advise?

## 2015-12-03 NOTE — Telephone Encounter (Signed)
I will refer him to neurology for further evaluation. EKG is abnormal but appears unchanged from 2014 EKG when he saw cardiology.

## 2015-12-03 NOTE — Telephone Encounter (Signed)
Relation to [id number] Call back number:[phone]   Reason for call:  Patient requesting a refill albuterol (PROVENTIL HFA;VENTOLIN HFA) 108 (90 BASE) MCG/ACT inhaler  And inquiring about EKG results

## 2015-12-03 NOTE — Telephone Encounter (Signed)
Jen--  Pt states he has [employer] and will need referral to see [facility]. He will call us back with appt date / time when he gets it scheduled.   Notified pt and he voices understanding.  He states someone just call him from neurology and he was not aware of why he was being referred and stated he was already established with [facility]. [facility] already closed today and pt states he is agreeable to see [facility] for vision problem. Gave pt contact #.

## 2015-12-13 ENCOUNTER — Telehealth: Payer: Self-pay | Admitting: Family

## 2015-12-13 NOTE — Telephone Encounter (Signed)
Pharmacy: [place], [place] [facility] [address]  Reason for call: Pt needing refill on restasis. Please send to mail order. He has about 1 week supply.

## 2015-12-13 NOTE — Telephone Encounter (Signed)
Per 10/19/15 phone note, PCP said Restasis refills should com from eye doctor. Notified pt and he voices understanding.

## 2015-12-27 ENCOUNTER — Other Ambulatory Visit (INDEPENDENT_AMBULATORY_CARE_PROVIDER_SITE_OTHER): Payer: Commercial Managed Care - HMO

## 2015-12-27 ENCOUNTER — Encounter: Payer: Self-pay | Admitting: Neurology

## 2015-12-27 ENCOUNTER — Ambulatory Visit (INDEPENDENT_AMBULATORY_CARE_PROVIDER_SITE_OTHER): Payer: Commercial Managed Care - HMO | Admitting: Neurology

## 2015-12-27 VITALS — BP 140/74 | HR 61 | Ht 66.5 in | Wt 181.4 lb

## 2015-12-27 DIAGNOSIS — G609 Hereditary and idiopathic neuropathy, unspecified: Secondary | ICD-10-CM | POA: Diagnosis not present

## 2015-12-27 DIAGNOSIS — H4922 Sixth [abducent] nerve palsy, left eye: Secondary | ICD-10-CM

## 2015-12-27 LAB — VITAMIN B12: VITAMIN B 12: 513 pg/mL (ref 211–911)

## 2015-12-27 LAB — SEDIMENTATION RATE: SED RATE: 25 mm/h — AB (ref 0–22)

## 2015-12-27 NOTE — Progress Notes (Signed)
[facility] Clinic Note - Initial Visit   Date: 12/27/2015  [name] Pratt MRN: [id number] DOB: 23-Feb-1941   Dear [name], NP:  Thank you for your kind referral of [name] Pratt for consultation of double vision. Although his history is well known to you, please allow [place] to reiterate it for the purpose of our medical record. The patient was accompanied to the clinic by self.    History of Present Illness: [name] Pratt is a 75 y.o. right-handed Caucasian male with hyperlipidemia, GERD, OSA on CPAP (followed by Dr. Brett Fairy at [facility]), hypertension, COPD, gout, hypothyroidism presenting for evaluation of diplopia.    Starting around early March 2017, he developed sudden onset of monocular and horizontal double vision.  He was helping a friend paint the trimming of a wall and after finishing the work, he sat down to talk to his friend.  When he glanced at the TV, he saw two images side-by-side.  He walked home and recalls seeing two stop lights and two double lines on the roads.  Objects were always side-by-side and he noticed that when he looked to the left, the double vision was worse.  When looking to the right, there was no double vision.  He recalls the day previously he was cleaning the cover on the back of his truck and using a [employer] cleaner and accidentally rubbed his eyes.  Later that night, he developed burning pain in both eyes and flushed it out with eye drops.  When double vision started, there was no headache, eye pain, facial numbness, dizziness, or associated weakness.   He saw Dr. 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    Around early April, he began noticing steady improvement of his double vision and now no longer notices it.  He has bilateral carotid stenosis (40-50% bilaterally).  He takes aspirin 68m and pravastatin 479mdaily (LDL 77).  No history of stroke or heart disease.     Out-side paper records, electronic medical record, and images have been reviewed where available and summarized as:  CT head without contrast 11/24/2015:  No acute finding by CT. 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  Lab Results  Component Value Date   HGBA1C 6.1 11/24/2015   Lab Results  Component Value Date   TSH 0.92 11/24/2015   Lab Results  Component Value Date   VITAMINB12 591 03/26/2015   Lab Results  Component Value Date   CHOL 142 11/24/2015   HDL 32.50* 11/24/2015   LDLCALC 77 11/24/2015   TRIG 162.0* 11/24/2015   CHOLHDL 4 11/24/2015      Past Medical History  Diagnosis Date  . Hyperlipidemia     under control  . Chest pain      Myoview in 2008 was normal.  Echo 7/09: EF 60%, normal wall motion, mild LVH, mild LAE.  . Inguinal hernia   . Pancreatitis   . Rosacea   . Gout   . Arthritis   . History of chicken pox   . GERD (gastroesophageal reflux disease)   . History of hemorrhoids   . OSA (obstructive sleep apnea)   . Carotid stenosis     a. Carotid U/S 5/5/68LICA < 5012%RICA 5075-17% b.  Carotid U/S 5/0/01 RICA 4074-94%LICA 0-4-96%f/u 1 year  . Hypertension     under control  . COPD (chronic obstructive pulmonary  disease) ([place])   . Hypothyroidism   . History of kidney stones   . Cancer ([place])     skin cancer  . Complication of anesthesia     "stomach does not wake up"  . Ileus ([place])   . PONV (postoperative nausea and vomiting)     Past Surgical History  Procedure Laterality Date  . Laparoscopic cholecystectomy  09/20/06    with intraoperative cholangiogram and right inguinal herniorrhaphy with mesh   . Hiatal hernia repair  12/21/93  . Knee surgery  1995  . Hemorrhoid surgery  2014  . Lithotripsy      x2  . Abdominal hernia repair  07/15/12    Dr [name]  . Nasal septum surgery  1975  . Esophageal dilation  1993 and 1994    multiple times  . Brow lift  05/07/01  . Urethral  dilation  1991    [name]  . Epidural injections      multiple procedures  . 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 Ligament repair Left 07/2013    shoulder  . Skin cancer destruction      nose, ear  . Inguinal hernia repair Right 07/15/12    Dr [name], x2  . Joint replacement  07/25/04    right knee  . Joint replacement  07/13/10    left knee  . Total hip arthroplasty Left   . Eye surgery Bilateral 03/25/10, 2012    cataract removal  . Refractive surgery Bilateral   . Tonsillectomy  as child  . Cystoscopy with ureteroscopy and stent placement Right 01/28/2014    Procedure: CYSTOSCOPY WITH URETEROSCOPY, BASKET RETRIVAL AND  STENT PLACEMENT;  Surgeon: [name], MD;  Location: [facility];  Service: Urology;  Laterality: Right;  . Holmium laser application Right 1/61/0960    Procedure: HOLMIUM LASER APPLICATION;  Surgeon: [name], MD;  Location: [facility];  Service: Urology;  Laterality: Right;     Medications:  Outpatient Encounter Prescriptions as of 12/27/2015  Medication Sig Note  . acetaminophen (TYLENOL) 500 MG tablet Take 500 mg by mouth every 6 (six) hours as needed for moderate pain.   [name] albuterol (PROVENTIL HFA;VENTOLIN HFA) 108 (90 Base) MCG/ACT inhaler Inhale 2 puffs into the lungs every 6 (six) hours as needed for shortness of breath.   . allopurinol (ZYLOPRIM) 100 MG tablet Take 1 tablet (100 mg total) by mouth daily.   [name] allopurinol (ZYLOPRIM) 300 MG tablet Take 1 tablet (300 mg total) by mouth daily.   [name] aspirin EC 81 MG tablet Take 81 mg by mouth every morning.   . Carboxymethylcellul-Glycerin (OPTIVE) 0.5-0.9 % SOLN Apply 1 drop to eye daily.   . cycloSPORINE (RESTASIS) 0.05 % ophthalmic emulsion Place 1 drop into both eyes 2 (two) times daily.   . diclofenac sodium (VOLTAREN) 1 % GEL Apply 1 application topically daily as needed (arthritis).   [name] diphenhydrAMINE (BENADRYL) 25 MG tablet Take 25 mg by mouth every 6 (six) hours as needed for allergies.   .  fluticasone (FLONASE) 50 MCG/ACT nasal spray Place 2 sprays into both nostrils daily.   [name] gemfibrozil (LOPID) 600 MG tablet TAKE ONE-HALF TABLET BY MOUTH TWICE DAILY BEFORE  MEALS   . hydrALAZINE (APRESOLINE) 50 MG tablet Take 1 tablet (50 mg total) by mouth 3 (three) times daily.   . IRON PO Take 50 mcg by mouth daily. 12/18/2014: Received from: [facility]:   . levothyroxine (SYNTHROID, LEVOTHROID) 150 MCG  tablet 1 tab by mouth once daily 6 days a week, 1/2 tab one day a week.   . losartan-hydrochlorothiazide (HYZAAR) 100-25 MG tablet Take 1 tablet by mouth daily.   . metoprolol succinate (TOPROL-XL) 25 MG 24 hr tablet Take 1 tablet (25 mg total) by mouth 2 (two) times daily.   . metroNIDAZOLE (METROCREAM) 0.75 % cream Apply 1 application topically daily as needed (rosacia).   . multivitamin (THERAGRAN) per tablet Take 1 tablet by mouth daily.     . Omega-3 Fatty Acids (FISH OIL PO) Take 1 capsule by mouth 2 (two) times daily.   [name] omeprazole (PRILOSEC) 20 MG capsule Take 1 capsule (20 mg total) by mouth every morning.   . pravastatin (PRAVACHOL) 40 MG tablet Take 1 tablet (40 mg total) by mouth daily.   . sodium chloride (OCEAN) 0.65 % nasal spray Place 1 spray into both nostrils at bedtime as needed for congestion.   . terbinafine (LAMISIL) 250 MG tablet Take 1 tablet (250 mg total) by mouth daily.    No facility-administered encounter medications on file as of 12/27/2015.     Allergies:  Allergies  Allergen Reactions  . Metoclopramide Hcl     anxiety    Family History: Family History  Problem Relation Age of Onset  . Hypertension    . Cancer    . Osteoarthritis    . Cancer Mother     Social History: Social History  Substance Use Topics  . Smoking status: Former Smoker -- 11 years    Types: Cigarettes    Quit date: 09/11/1978  . Smokeless tobacco: Never Used  . Alcohol Use: 0.0 oz/week    0 Standard drinks or equivalent per week     Comment: rare   Social  History   Social History Narrative   Lives with his wife   He has one son- lives locally.   2 grandchildren   Has worked for [employer], trade [employer]   Enjoys wood working   Completed HS.  [employer]       Review of Systems:  CONSTITUTIONAL: No fevers, chills, night sweats, or weight loss.   EYES: No visual changes or eye pain ENT: No hearing changes.  No history of nose bleeds.   RESPIRATORY: No cough, wheezing and shortness of breath.   CARDIOVASCULAR: Negative for chest pain, and palpitations.   GI: Negative for abdominal discomfort, blood in stools or black stools.  No recent change in bowel habits.   GU:  No history of incontinence.   MUSCLOSKELETAL: No history of joint pain or swelling.  No myalgias.   SKIN: Negative for lesions, rash, and itching.   HEMATOLOGY/ONCOLOGY: Negative for prolonged bleeding, bruising easily, and swollen nodes.  No history of cancer.   ENDOCRINE: Negative for cold or heat intolerance, polydipsia or goiter.   PSYCH:  No depression or anxiety symptoms.   NEURO: As Above.   Vital Signs:  BP 140/74 mmHg  Pulse 61  Ht 5' 6.5" (1.689 m)  Wt 181 lb 6 oz (82.271 kg)  BMI 28.84 kg/m2  SpO2 96% Pain Scale: 0 on a scale of 0-10   General Medical Exam:   General:  Well appearing, comfortable.   Eyes/ENT: see cranial nerve examination.   Neck: No masses appreciated.  Full range of motion without tenderness.  No carotid bruits. Respiratory:  Clear to auscultation, good air entry bilaterally.   Cardiac:  Regular rate and rhythm, no murmur.   Extremities:  No deformities, edema, or  skin discoloration.  Skin:  No rashes or lesions.  Neurological Exam: MENTAL STATUS including orientation to time, place, person, recent and remote memory, attention span and concentration, language, and fund of knowledge is normal.  Speech is not dysarthric.  CRANIAL NERVES: II:  No visual field defects.  Fundoscopic exam limited due to pin point pupils.   III-IV-VI: Pupils  equal round and reactive to light.  Left lateral gaze is mildly restricted, otherwise, normal conjugate, extra-ocular eye movements in all directions of gaze.  No nystagmus.  Subtle left ptosis (old).   V:  Normal facial sensation.  Jaw jerk is absent.   VII:  Normal facial symmetry and movements.  No pathologic facial reflexes.  VIII:  Normal hearing and vestibular function.   IX-X:  Normal palatal movement.   XI:  Normal shoulder shrug and head rotation.   XII:  Normal tongue strength and range of motion, no deviation or fasciculation.  MOTOR:  No atrophy, fasciculations or abnormal movements.  No pronator drift.  Tone is normal.    Right Upper Extremity:    Left Upper Extremity:    Deltoid  5/5   Deltoid  5/5   Biceps  5/5   Biceps  5/5   Triceps  5/5   Triceps  5/5   Wrist extensors  5/5   Wrist extensors  5/5   Wrist flexors  5/5   Wrist flexors  5/5   Finger extensors  5/5   Finger extensors  5/5   Finger flexors  5/5   Finger flexors  5/5   Dorsal interossei  5/5   Dorsal interossei  5/5   Abductor pollicis  5/5   Abductor pollicis  5/5   Tone (Ashworth scale)  0  Tone (Ashworth scale)  0   Right Lower Extremity:    Left Lower Extremity:    Hip flexors  5/5   Hip flexors  5/5   Hip extensors  5/5   Hip extensors  5/5   Knee flexors  5/5   Knee flexors  5/5   Knee extensors  5/5   Knee extensors  5/5   Dorsiflexors  5/5   Dorsiflexors  5/5   Plantarflexors  5/5   Plantarflexors  5/5   Toe extensors  5/5   Toe extensors  5/5   Toe flexors  5/5   Toe flexors  5/5   Tone (Ashworth scale)  0  Tone (Ashworth scale)  0   MSRs:  Right                                                                 Left brachioradialis 2+  brachioradialis 2+  biceps 2+  biceps 2+  triceps 2+  triceps 2+  patellar 2+  patellar 2+  ankle jerk 2+  ankle jerk 2+  Hoffman no  Hoffman no  plantar response down  plantar response down   SENSORY: Pin prick, vibration, and temperature is absent distal  to ankles bilaterally.  There is mild sway with Romberg testing.   COORDINATION/GAIT: Normal finger-to- nose-finger and heel-to-shin.  Intact rapid alternating movements bilaterally.  Able to rise from a chair without using arms.  Gait narrow based and stable.  Mild unsteadiness with tandem and stressed gait.  IMPRESSION: 1.  Left cranial nerve VI palsy, improving.  Indeterminate cause, microischemia is possible given the abrupt onset.  - No signs of other cranial nerve involvement.  No diabetes.   - Symptoms started with sudden onset of horizontal diplopia in early March without other associated symptoms  - Since onset, symptoms are improving and only noticeable with left end-gaze  - For completeness and to screen for treatable cause, myasthenia gravis antibody panel will be checked, but my overall suspicion is low especially as symptoms do not fluctuate   - Continue aspirin and statin therapy  - Patient will call with an update in 1 month, if no improvement proceed with MRI brain wwo contrast  2.  Idiopathic peripheral neuropathy  - Exam shows distal sensory loss below the level of the ankles bilaterally and mild ataxia  - No signs of diabetes or alcoholism.  TSH is within normal limits  - Check vitamin B12, ESR, copper  - Fall precautions discussed  3.  OSA on CPAP, followed by Dr. Brett Fairy   The duration of this appointment visit was 45 minutes of face-to-face time with the patient.  Greater than 50% of this time was spent in counseling, explanation of diagnosis, planning of further management, and coordination of care.   Thank you for allowing me to participate in patient's care.  If I can answer any additional questions, I would be pleased to do so.    Sincerely,    Donika K. Posey Pronto, DO

## 2015-12-27 NOTE — Patient Instructions (Addendum)
1.  Check blood work 2.  Please call with an update in a month

## 2015-12-29 ENCOUNTER — Ambulatory Visit: Payer: Medicare Other | Admitting: Family

## 2015-12-29 LAB — COPPER, SERUM: Copper: 133 ug/dL (ref 72–166)

## 2016-01-05 ENCOUNTER — Encounter: Payer: Self-pay | Admitting: *Deleted

## 2016-01-05 LAB — MYASTHENIA GRAVIS PANEL 2
Acetylcholine Rec Mod Ab: <1 % binding inhibition
Aceytlcholine Rec Bloc Ab: <15 % of inhibition (ref <15)

## 2016-01-11 DIAGNOSIS — I10 Essential (primary) hypertension: Secondary | ICD-10-CM | POA: Diagnosis not present

## 2016-01-11 DIAGNOSIS — Z961 Presence of intraocular lens: Secondary | ICD-10-CM | POA: Diagnosis not present

## 2016-01-11 DIAGNOSIS — H35342 Macular cyst, hole, or pseudohole, left eye: Secondary | ICD-10-CM | POA: Diagnosis not present

## 2016-01-11 DIAGNOSIS — H35341 Macular cyst, hole, or pseudohole, right eye: Secondary | ICD-10-CM | POA: Diagnosis not present

## 2016-01-11 DIAGNOSIS — H18413 Arcus senilis, bilateral: Secondary | ICD-10-CM | POA: Diagnosis not present

## 2016-01-13 ENCOUNTER — Ambulatory Visit: Payer: Commercial Managed Care - HMO | Attending: Audiology | Admitting: Audiology

## 2016-01-13 DIAGNOSIS — H93293 Other abnormal auditory perceptions, bilateral: Secondary | ICD-10-CM | POA: Diagnosis not present

## 2016-01-13 DIAGNOSIS — R292 Abnormal reflex: Secondary | ICD-10-CM | POA: Diagnosis not present

## 2016-01-13 DIAGNOSIS — H9041 Sensorineural hearing loss, unilateral, right ear, with unrestricted hearing on the contralateral side: Secondary | ICD-10-CM

## 2016-01-13 DIAGNOSIS — H905 Unspecified sensorineural hearing loss: Secondary | ICD-10-CM | POA: Diagnosis not present

## 2016-01-13 DIAGNOSIS — H906 Mixed conductive and sensorineural hearing loss, bilateral: Secondary | ICD-10-CM | POA: Insufficient documentation

## 2016-01-13 DIAGNOSIS — IMO0001 Reserved for inherently not codable concepts without codable children: Secondary | ICD-10-CM

## 2016-01-13 NOTE — Procedures (Signed)
[facility]  [place], [place] [address]  [phone]   Audiological Evaluation  Patient Name: [name] Pratt  Status: Outpatient   DOB: 11/18/1940    Diagnosis: Right Hearing Loss MRN: [id number] Date:  01/13/2016     Referent: Nance Pear., NP  History: [name] was seen for an audiological evaluation because of concerns about "a gradual hearing loss on the right side".  He notices "difficulty understanding others when there are other noises".  He reports feeling "off-balanced at times" but had "his balance checked at the physician office and it was fine".  [name] reports occasional "ain at the bottom of his right ear".  He denies having tinnitus. He has a history of "nasal stuffiness" for which he takes allergy medication.  He also treated for "hypertension".    Evaluation: Conventional pure tone audiometry from 250Hz  - 8000Hz  with using insert earphones.  Hearing Thresholds are symmetrical in the low frequencies and are 50-55 dBHL at 250Hz  and 45 dBHL at 1000Hz ; In the higher frequencies the right ear is poorer with hearing thresholds at 1000Hz  - 2000Hz  of 30-35 dbHL in the left and 50 dBHL in the right; at 4000Hz  55 dBHL in the left and 75 dBHL in the right; at 6000Hz  65 dBHL in the left and 90 dBHL in the right and at 8000Hz  80 dBHL in the left and no response on the right.  There is a mixed component bilaterally. Reliability is good Speech reception levels (repeating words near threshold) using recorded spondee word lists:  Right ear: 55 dBHL.  Left ear:  40 dBHL Word recognition (at comfortably loud volumes) using recorded NU-6 word lists, in quiet.  Right ear: 92% at 75 dbHL with 70 dBHL contralateral masking using speech noise.  Left ear:   94% at 70 dBHL  With 60 dBHL contralateral masking using speech noise. Word recognition in minimal background noise:  +5 dBHL  Right ear: 32%                Left ear:  50%  Tympanometry (middle ear function) with ipsilateral acoustic reflexes.  Right ear: Normal (Type A) with  acoustic reflexes raning from 90dB at 500Hz -1000Hz  and no response at 4000Hz .  Left ear: Normal (Type A) with  acoustic reflex of 95 dB at 500Hz  and 90dB from 1000Hz  - 4000Hz .   CONCLUSION:      Kayl has a significant hearing loss bilaterally that is poorer on the right side.  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   Word recognition is excellent in each ear at loud conversational speech levels. In minimal background noise, word recognition drops to very poor in each ear. This amount of hearing loss will  adversely affect speech communication at normal conversational speech levels in quiet and especially in minimal background noise.  [name] was counseled that he would benefit from amplification; however because of the mixed hearing component, it is possible that some of his hearing may be improved so further evaluation by an ENT is strongly recommended.  Following ENT medical clearance, then a hearing aid evaluation is strongly recommended.  Since ADREIN WILLIG was in [employer] force from [phone] he is considering seeking VA benefits to help with hearing aids.   RECOMMENDATIONS: 1.   Monitor hearing closely with a repeat audiological evaluation in 3-6  months.  Here, at the ENT office or in conjunction with a hearing aid evaluation to determine hearing stability. 2.   Referral to an ENT (Ear, Nose and Throat physician) to evaluate the mixed hearing loss. 3.    Following ENT clearance, a hearing aid evaluation. 4.  Strategies that help improve hearing include: A) Face the speaker directly. Optimal is having the speakers face well - lit.  Unless amplified, being within 3-6 feet of the speaker will enhance word recognition. B) Avoid having the speaker back-lit  as this will minimize the ability to use cues from lip-reading, facial expression and gestures. C)  Word recognition is poorer in background noise. For optimal word recognition, turn off the TV, radio or noisy fan when engaging in conversation. In a restaurant, try to sit away from noise sources and close to the primary speaker.  D)  Ask for topic clarification from time to time in order to remain in the conversation.  Most people don't mind repeating or clarifying a point when asked.  If needed, explain the difficulty hearing in background noise or hearing loss. 5.   Use hearing protection during noisy activities such as using a weed eater, moving the lawn, shooting, etc  Deborah L. Heide Spark, Au.D., CCC-A Doctor of Audiology 01/13/2016   cc: Nance Pear., NP

## 2016-01-17 ENCOUNTER — Encounter: Payer: Self-pay | Admitting: Cardiovascular Disease

## 2016-01-17 ENCOUNTER — Ambulatory Visit (INDEPENDENT_AMBULATORY_CARE_PROVIDER_SITE_OTHER): Payer: Commercial Managed Care - HMO | Admitting: Cardiovascular Disease

## 2016-01-17 VITALS — BP 170/72 | HR 56 | Ht 67.0 in | Wt 179.4 lb

## 2016-01-17 DIAGNOSIS — I6523 Occlusion and stenosis of bilateral carotid arteries: Secondary | ICD-10-CM | POA: Diagnosis not present

## 2016-01-17 DIAGNOSIS — R0789 Other chest pain: Secondary | ICD-10-CM | POA: Diagnosis not present

## 2016-01-17 DIAGNOSIS — I10 Essential (primary) hypertension: Secondary | ICD-10-CM

## 2016-01-17 NOTE — Patient Instructions (Signed)
Medication Instructions:  Your physician recommends that you continue on your current medications as directed. Please refer to the Current Medication list given to you today.  Labwork: NONE  Testing/Procedures Your physician has requested that you have a lexiscan myoview. For further information please visit [email or web address]. Please follow instruction sheet, as given.  Follow-Up: Your physician wants you to follow-up in: 6 months with Dr. Nishan. You will receive a reminder letter in the mail two months in advance. If you don't receive a letter, please call our office to schedule the follow-up appointment.   If you need a refill on your cardiac medications before your next appointment, please call your pharmacy.    

## 2016-01-17 NOTE — Progress Notes (Signed)
Patient ID: [name] Pratt, male   DOB: 11-25-1940, 75 y.o.   MRN: [id number]     CARDIOLOGY OFFICE NOTE  Date:  01/17/2016    [name] Date of Birth: 03-21-1941 Medical Record [id number]  PCP:  Nance Pear., NP  Cardiologist:  Johnsie Cancel    No chief complaint on file.   History of Present Illness:  [name] Pratt is a 75 y.o. male   has a history of HLD, GERD, carotid disease, HTN and COPD. Other issues as noted below. Remote echo from 2009 with normal EF. Normal Myoview from 2008.   Presented to the ER  08/2015  - BP had been elevated for about 2 days - given dose of Clonidine. 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 Intermittent not always exertional lasts minutes   Past Medical History  Diagnosis Date  . Hyperlipidemia     under control  . Chest pain      Myoview in 2008 was normal.  Echo 7/09: EF 60%, normal wall motion, mild LVH, mild LAE.  . Inguinal hernia   . Pancreatitis   . Rosacea   . Gout   . Arthritis   . History of chicken pox   . GERD (gastroesophageal reflux disease)   . History of hemorrhoids   . OSA (obstructive sleep apnea)   . Carotid stenosis     a. Carotid U/S [id number]: LICA < [id number], RICA 0000000;  b.  Carotid U/S 0000000:  RICA 123456; LICA XX123456; f/u 1 year  . Hypertension     under control  . COPD (chronic obstructive pulmonary disease) ([place])   . Hypothyroidism   . History of kidney stones   . Cancer ([place])     skin cancer  . Complication of anesthesia     "stomach does not wake up"  . Ileus ([place])   . PONV (postoperative nausea and vomiting)      Past Surgical History  Procedure Laterality Date  . Laparoscopic cholecystectomy  09/20/06    with intraoperative cholangiogram and right inguinal herniorrhaphy with mesh   . Hiatal hernia repair  12/21/93  . Knee surgery  1995  . Hemorrhoid surgery  2014  . Lithotripsy      x2  . Abdominal hernia repair  07/15/12    Dr [name]  . Nasal septum surgery  1975  . Esophageal dilation  1993 and 1994    multiple times  . Brow lift  05/07/01  . Urethral dilation  1991    [name]  . Epidural injections      multiple procedures  . 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 Ligament repair Left 07/2013    shoulder  . Skin cancer destruction      nose, ear  . Inguinal hernia repair Right 07/15/12    Dr [name], x2  . Joint replacement  07/25/04    right knee  . Joint replacement  07/13/10    left knee  . Total hip arthroplasty Left   . Eye surgery Bilateral 03/25/10, 2012  cataract removal  . Refractive surgery Bilateral   . Tonsillectomy  as child  . Cystoscopy with ureteroscopy and stent placement Right 01/28/2014    Procedure: CYSTOSCOPY WITH URETEROSCOPY, BASKET RETRIVAL AND  STENT PLACEMENT;  Surgeon: [name], MD;  Location: [facility];  Service: Urology;  Laterality: Right;  . Holmium laser application Right [id number]    Procedure: HOLMIUM LASER APPLICATION;  Surgeon: [name], MD;  Location: [facility];  Service: Urology;  Laterality: Right;     Medications: Current Outpatient Prescriptions  Medication Sig Dispense Refill  . acetaminophen (TYLENOL) 500 MG tablet Take 500 mg by mouth every 6 (six) hours as needed for moderate pain.    [name] albuterol (PROVENTIL HFA;VENTOLIN HFA) 108 (90 Base) MCG/ACT inhaler Inhale 2 puffs into the lungs every 6 (six) hours as needed for shortness of breath. 3 Inhaler 1  . allopurinol (ZYLOPRIM) 100 MG tablet Take 1 tablet (100 mg total) by mouth daily. 14 tablet 0  . allopurinol (ZYLOPRIM) 300 MG tablet Take 1 tablet (300 mg total) by mouth  daily. 90 tablet 1  . aspirin EC 81 MG tablet Take 81 mg by mouth every morning.    . Carboxymethylcellul-Glycerin (OPTIVE) 0.5-0.9 % SOLN Apply 1 drop to eye daily.    . cycloSPORINE (RESTASIS) 0.05 % ophthalmic emulsion Place 1 drop into both eyes 2 (two) times daily.    . diclofenac sodium (VOLTAREN) 1 % GEL Apply 1 application topically daily as needed (arthritis). 100 g 0  . diphenhydrAMINE (BENADRYL) 25 MG tablet Take 25 mg by mouth every 6 (six) hours as needed for allergies.    . fluticasone (FLONASE) 50 MCG/ACT nasal spray Place 2 sprays into both nostrils daily. 48 g 1  . gemfibrozil (LOPID) 600 MG tablet TAKE ONE-HALF TABLET BY MOUTH TWICE DAILY BEFORE  MEALS 14 tablet 0  . hydrALAZINE (APRESOLINE) 50 MG tablet Take 1 tablet (50 mg total) by mouth 3 (three) times daily. 270 tablet 1  . IRON PO Take 50 mcg by mouth daily.    [name] levothyroxine (SYNTHROID, LEVOTHROID) 150 MCG tablet 1 tab by mouth once daily 6 days a week, 1/2 tab one day a week. 78 tablet 1  . losartan-hydrochlorothiazide (HYZAAR) 100-25 MG tablet Take 1 tablet by mouth daily. 90 tablet 3  . metoprolol succinate (TOPROL-XL) 25 MG 24 hr tablet Take 1 tablet (25 mg total) by mouth 2 (two) times daily. 14 tablet 0  . metroNIDAZOLE (METROCREAM) 0.75 % cream Apply 1 application topically daily as needed (rosacia).    . multivitamin (THERAGRAN) per tablet Take 1 tablet by mouth daily.      . Omega-3 Fatty Acids (FISH OIL PO) Take 1 capsule by mouth 2 (two) times daily.    [name] omeprazole (PRILOSEC) 20 MG capsule Take 1 capsule (20 mg total) by mouth every morning. 90 capsule 1  . pravastatin (PRAVACHOL) 40 MG tablet Take 1 tablet (40 mg total) by mouth daily. 90 tablet 1  . sodium chloride (OCEAN) 0.65 % nasal spray Place 1 spray into both nostrils at bedtime as needed for congestion.    . terbinafine (LAMISIL) 250 MG tablet Take 1 tablet (250 mg total) by mouth daily. 30 tablet 1   No current facility-administered medications for  this visit.    Allergies: Allergies  Allergen Reactions  . Metoclopramide Hcl     anxiety    Social History: The patient  reports that he quit smoking about 37 years ago. His smoking use  included Cigarettes. He quit after 11 years of use. He has never used smokeless tobacco. He reports that he drinks alcohol. He reports that he does not use illicit drugs.   Family History: The patient's family history includes Cancer in his mother.   Review of Systems: Please see the history of present illness.   Otherwise, the review of systems is positive for none.   All other systems are reviewed and negative.   Physical Exam: VS:  There were no vitals taken for this visit. [name]  BMI There is no weight on file to calculate BMI.  Wt Readings from Last 3 Encounters:  12/27/15 82.271 kg (181 lb 6 oz)  11/24/15 83.008 kg (183 lb)  10/22/15 83.462 kg (184 lb)   BP is   General: Pleasant. Well developed, well nourished and in no acute distress. His weight is increasing.   HEENT: Normal. Neck: Supple Cardiac: Regular rate and rhythm. No murmurs, rubs, or gallops. No edema.  Respiratory:  Lungs are clear to auscultation bilaterally with normal work of breathing.  GI: Soft and nontender. I do not appreciate any bruits.  MS: No deformity or atrophy. Gait and ROM intact. Skin: Warm and dry. Color is normal.  Neuro:  Strength and sensation are intact and no gross focal deficits noted.  Psych: Alert, appropriate and with normal affect.   LABORATORY DATA:  EKG:  SB rate 54 RBBB LAFB LVH   Lab Results  Component Value Date   WBC 5.6 11/24/2015   HGB 13.9 11/24/2015   HCT 41.5 11/24/2015   PLT 346.0 11/24/2015   GLUCOSE 124* 11/24/2015   CHOL 142 11/24/2015   TRIG 162.0* 11/24/2015   HDL 32.50* 11/24/2015   LDLCALC 77 11/24/2015   ALT 24 11/24/2015   AST 26 11/24/2015   NA 139 11/24/2015   K 3.6 11/24/2015   CL 102 11/24/2015   CREATININE 1.29 11/24/2015   BUN 29* 11/24/2015   CO2 29  11/24/2015   TSH 0.92 11/24/2015   PSA 0.31 05/26/2013   INR 1.06 12/11/2012   HGBA1C 6.1 11/24/2015    BNP (last 3 results) No results for input(s): BNP in the last 8760 hours.  ProBNP (last 3 results) No results for input(s): PROBNP in the last 8760 hours.   Other Studies Reviewed Today:   Assessment/Plan: 1. HTN -  Improved on current meds his BP cuff inaccurate adjust time of day taking meds Low sodium diet  2. HLD - on statin therapy.   3. Carotid disease - for repeat study in 05/2016  4. COPD with stable dyspnea.   5. 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  The patient does not have concerns regarding medicines other than what has been noted above.  The following changes have been made:  See above.  Labs/ tests ordered today include:    No orders of the defined types were placed in this encounter.     Disposition:      F.u with me 6 months  Jenkins Rouge

## 2016-01-20 ENCOUNTER — Telehealth (HOSPITAL_COMMUNITY): Payer: Self-pay | Admitting: *Deleted

## 2016-01-20 NOTE — Telephone Encounter (Signed)
Left message on voicemail per DPR in reference to upcoming appointment scheduled on 01/25/16 at [facility] with detailed instructions given per Myocardial Perfusion Study Information Sheet for the test. LM to arrive 15 minutes early, and that it is imperative to arrive on time for appointment to keep from having the test rescheduled. If you need to cancel or reschedule your appointment, please call the office within 24 hours of your appointment. Failure to do so may result in a cancellation of your appointment, and a $50 no show fee. Phone number given for call back for any questions. Frandy Basnett, Ranae Palms

## 2016-01-24 ENCOUNTER — Telehealth: Payer: Self-pay | Admitting: Nurse Practitioner

## 2016-01-24 ENCOUNTER — Encounter: Payer: Self-pay | Admitting: Adult Health

## 2016-01-24 NOTE — Telephone Encounter (Signed)
Tried to return call about June 2017 appt 2 times but line was busy.

## 2016-01-24 NOTE — Telephone Encounter (Signed)
Spoke with patient's wife and r/s appt for  02/28/2016 @2 :00.

## 2016-01-25 ENCOUNTER — Ambulatory Visit (HOSPITAL_COMMUNITY): Payer: Commercial Managed Care - HMO | Attending: Cardiology

## 2016-01-25 ENCOUNTER — Other Ambulatory Visit (HOSPITAL_COMMUNITY): Payer: Self-pay | Admitting: Internal Medicine

## 2016-01-25 DIAGNOSIS — R0609 Other forms of dyspnea: Secondary | ICD-10-CM | POA: Insufficient documentation

## 2016-01-25 DIAGNOSIS — E119 Type 2 diabetes mellitus without complications: Secondary | ICD-10-CM | POA: Diagnosis not present

## 2016-01-25 DIAGNOSIS — I119 Hypertensive heart disease without heart failure: Secondary | ICD-10-CM | POA: Diagnosis not present

## 2016-01-25 DIAGNOSIS — I779 Disorder of arteries and arterioles, unspecified: Secondary | ICD-10-CM | POA: Diagnosis not present

## 2016-01-25 DIAGNOSIS — R0789 Other chest pain: Secondary | ICD-10-CM | POA: Diagnosis not present

## 2016-01-25 DIAGNOSIS — I340 Nonrheumatic mitral (valve) insufficiency: Secondary | ICD-10-CM

## 2016-01-25 LAB — MYOCARDIAL PERFUSION IMAGING
CHL CUP NUCLEAR SRS: 0
CHL CUP RESTING HR STRESS: 44 {beats}/min
CSEPPHR: 58 {beats}/min
LVDIAVOL: 146 mL (ref 62–150)
LVSYSVOL: 76 mL
NUC STRESS TID: 0.99
RATE: 0.25
SDS: 2
SSS: 2

## 2016-01-25 MED ORDER — REGADENOSON 0.4 MG/5ML IV SOLN
0.4000 mg | Freq: Once | INTRAVENOUS | Status: AC
  Administered 2016-01-25: 0.4 mg via INTRAVENOUS

## 2016-01-25 MED ORDER — TECHNETIUM TC 99M TETROFOSMIN IV KIT
31.0000 | PACK | Freq: Once | INTRAVENOUS | Status: AC | PRN
  Administered 2016-01-25: 31 via INTRAVENOUS
  Filled 2016-01-25: qty 31

## 2016-01-25 MED ORDER — TECHNETIUM TC 99M TETROFOSMIN IV KIT
10.7000 | PACK | Freq: Once | INTRAVENOUS | Status: AC | PRN
  Administered 2016-01-25: 11 via INTRAVENOUS
  Filled 2016-01-25: qty 11

## 2016-01-27 ENCOUNTER — Telehealth: Payer: Self-pay | Admitting: Cardiovascular Disease

## 2016-01-27 DIAGNOSIS — R931 Abnormal findings on diagnostic imaging of heart and coronary circulation: Secondary | ICD-10-CM

## 2016-01-27 NOTE — Telephone Encounter (Signed)
F/u with me can see what echo shows and discuss possible heart cath

## 2016-01-27 NOTE — Telephone Encounter (Signed)
Patient is aware of stress test results. Patient stated that during stress test he had a syncopal episode (blacked out). DOD, Dr. Marlou Porch instructed patient to stop his metoprolol. Patient is not taking his metoprolol since stress test. Patient stated he is still having the occasional chest pain while lying or sitting at rest. Patient stated the pain last for about a minute, and comes and goes during that time. Patient denies any SOB, swelling, diaphoresis and pain during exertion. Patient stated he has no active chest pain at this time. Patient wanted to let Dr. Johnsie Cancel know of the medication change and his experience during the stress test. Patient will come in for echo on 02/10/16 Will forward to Dr. Johnsie Cancel for further advisement.  Notes Recorded by [name], MD on 01/26/2016 at 12:24 PM myovue with no ischemia EF 48% f/u echo to evaluate

## 2016-01-27 NOTE — Telephone Encounter (Signed)
New Message:  Pt is calling you back in regards to the results of his Stress test. Please f/u with him

## 2016-01-28 NOTE — Telephone Encounter (Signed)
Called patient. Patient scheduled for echo on 02/10/16 and office visit with Dr. Johnsie Cancel to discuss heart cath on 02/14/16. Patient verbalized understanding and will call with any other questions or concerns.

## 2016-02-02 ENCOUNTER — Telehealth: Payer: Self-pay | Admitting: Family

## 2016-02-02 ENCOUNTER — Ambulatory Visit (INDEPENDENT_AMBULATORY_CARE_PROVIDER_SITE_OTHER): Payer: Commercial Managed Care - HMO | Admitting: Family

## 2016-02-02 VITALS — BP 140/70 | HR 74 | Temp 98.0°F | Resp 18 | Ht 66.5 in | Wt 174.4 lb

## 2016-02-02 DIAGNOSIS — H9193 Unspecified hearing loss, bilateral: Secondary | ICD-10-CM

## 2016-02-02 DIAGNOSIS — H4910 Fourth [trochlear] nerve palsy, unspecified eye: Secondary | ICD-10-CM

## 2016-02-02 DIAGNOSIS — R55 Syncope and collapse: Secondary | ICD-10-CM | POA: Diagnosis not present

## 2016-02-02 DIAGNOSIS — J309 Allergic rhinitis, unspecified: Secondary | ICD-10-CM

## 2016-02-02 DIAGNOSIS — H4912 Fourth [trochlear] nerve palsy, left eye: Secondary | ICD-10-CM | POA: Diagnosis not present

## 2016-02-02 DIAGNOSIS — H9190 Unspecified hearing loss, unspecified ear: Secondary | ICD-10-CM

## 2016-02-02 HISTORY — DX: Fourth (trochlear) nerve palsy, unspecified eye: H49.10

## 2016-02-02 HISTORY — DX: Unspecified hearing loss, unspecified ear: H91.90

## 2016-02-02 HISTORY — DX: Allergic rhinitis, unspecified: J30.9

## 2016-02-02 NOTE — Progress Notes (Signed)
Subjective:    Patient ID: [name] Pratt, male    DOB: Jun 12, 1941, 75 y.o.   MRN: [id number]  HPI  [name] Pratt is a 75 yr old male who presents today for follow up.  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  ENT was recommended however this appointment has not yet been made.   Stress test- pt had myoview LVEF 48%, no ischemia. However pt had a syncopal event during myoview and as told by Dr. Marlou Porch to stop his metoprolol.  He was scheduled for a follow up echo and has follow up with Dr. Johnsie Cancel to discuss possible heart cath on 02/14/16.    Left cranial nerve IV nerve palsy- Still has double vision with lateral gaze, however it is improved when he looks forward. Was told that symptoms are due to 6th nerve palsy. Myasthenia gravis testing was negative.   Nasal congestion- x 3 weeks, dry cough/itching throat- worse when he is outdoors doing yard work.   Review of Systems    see HPI  Past Medical History  Diagnosis Date  . Hyperlipidemia     under control  . Chest pain      Myoview in 2008 was normal.  Echo 7/09: EF 60%, normal wall motion, mild LVH, mild LAE.  . Inguinal hernia   . Pancreatitis   . Rosacea   . Gout   . Arthritis   . History of chicken pox   . GERD (gastroesophageal reflux disease)   . History of hemorrhoids   . OSA (obstructive sleep apnea)   . Carotid stenosis     a. Carotid U/S [id number]: LICA < [id number], RICA 0000000;  b.  Carotid U/S 0000000:  RICA 123456; LICA [id number]; f/u 1 year  . Hypertension     under control  . COPD (chronic obstructive pulmonary disease) ([place])   . Hypothyroidism   . History of kidney stones   . Cancer ([place])     skin cancer  . Complication of anesthesia     "stomach does not wake up"  . Ileus ([place])   . PONV (postoperative nausea and vomiting)      Social History   Social History  . Marital Status: Married    Spouse Name: N/A  . Number of Children: 1  . Years of  Education: N/A   Occupational History  . firefighter     paints on the side   Social History Main Topics  . Smoking status: Former Smoker -- 11 years    Types: Cigarettes    Quit date: 09/11/1978  . Smokeless tobacco: Never Used  . Alcohol Use: 0.0 oz/week    0 Standard drinks or equivalent per week     Comment: rare  . Drug Use: No  . Sexual Activity: Not on file   Other Topics Concern  . Not on file   Social History Narrative   Lives with his wife   He has one son- lives locally.   2 grandchildren   Has worked for [employer], trade [employer]   Enjoys wood working   Completed HS.  [employer]       Past Surgical History  Procedure Laterality Date  . Laparoscopic cholecystectomy  09/20/06    with intraoperative cholangiogram and right inguinal herniorrhaphy with mesh   . Hiatal hernia repair  12/21/93  . Knee surgery  1995  . Hemorrhoid surgery  2014  . Lithotripsy      x2  .  Abdominal hernia repair  07/15/12    Dr [name]  . Nasal septum surgery  1975  . Esophageal dilation  1993 and 1994    multiple times  . Brow lift  05/07/01  . Urethral dilation  1991    [name]  . Epidural injections      multiple procedures  . 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 Ligament repair Left 07/2013    shoulder  . Skin cancer destruction      nose, ear  . Inguinal hernia repair Right 07/15/12    Dr [name], x2  . Joint replacement  07/25/04    right knee  . Joint replacement  07/13/10    left knee  . Total hip arthroplasty Left   . Eye surgery Bilateral 03/25/10, 2012    cataract removal  . Refractive surgery Bilateral   . Tonsillectomy  as child  . Cystoscopy with ureteroscopy and stent placement Right 01/28/2014    Procedure: CYSTOSCOPY WITH URETEROSCOPY, BASKET RETRIVAL AND  STENT PLACEMENT;  Surgeon: [name], MD;  Location: [facility];  Service: Urology;  Laterality: Right;  . Holmium laser application Right [id number]    Procedure: HOLMIUM LASER APPLICATION;  Surgeon: [name], MD;  Location: [facility];  Service: Urology;  Laterality: Right;    Family History  Problem Relation Age of Onset  . Hypertension    . Cancer    . Osteoarthritis    . Cancer Mother     Allergies  Allergen Reactions  . Metoclopramide Hcl     anxiety    Current Outpatient Prescriptions on File Prior to Visit  Medication Sig Dispense Refill  . acetaminophen (TYLENOL) 500 MG tablet Take 500 mg by mouth every 6 (six) hours as needed for moderate pain.    [name] albuterol (PROVENTIL HFA;VENTOLIN HFA) 108 (90 Base) MCG/ACT inhaler Inhale 2 puffs into the lungs every 6 (six) hours as needed for shortness of breath. 3 Inhaler 1  . allopurinol (ZYLOPRIM) 100 MG tablet Take 1 tablet (100 mg total) by mouth daily. 14 tablet 0  . allopurinol (ZYLOPRIM) 300 MG tablet Take 1 tablet (300 mg total) by mouth daily. 90 tablet 1  . aspirin EC 81 MG tablet Take 81 mg by mouth every morning.    . Carboxymethylcellul-Glycerin (OPTIVE) 0.5-0.9 % SOLN Apply 1 drop to eye daily.    . cycloSPORINE (RESTASIS) 0.05 % ophthalmic emulsion Place 1 drop into both eyes 2 (two) times daily.    . diclofenac sodium (VOLTAREN) 1 % GEL Apply 1 application topically daily as needed (arthritis). 100 g 0  . diphenhydrAMINE (BENADRYL) 25 MG tablet Take 25 mg by mouth every 6 (six) hours as needed for allergies.    . fluticasone (FLONASE) 50 MCG/ACT nasal spray Place 2 sprays into both nostrils daily. 48 g 1  . gemfibrozil (LOPID) 600 MG tablet TAKE ONE-HALF TABLET BY MOUTH TWICE DAILY BEFORE  MEALS 14 tablet 0  . hydrALAZINE (APRESOLINE) 50 MG tablet Take 1 tablet (50 mg total) by mouth 3 (three) times daily. 270 tablet 1  . IRON PO Take 50 mcg by mouth daily.    [name] levothyroxine (SYNTHROID, LEVOTHROID) 150 MCG tablet 1 tab by mouth once daily 6 days a week, 1/2 tab one day a week. 78 tablet 1  . losartan-hydrochlorothiazide (HYZAAR) 100-25 MG tablet Take 1 tablet by mouth daily. 90 tablet 3  . multivitamin (THERAGRAN) per  tablet Take 1 tablet by mouth daily.      [name]  Omega-3 Fatty Acids (FISH OIL PO) Take 1 capsule by mouth 2 (two) times daily.    [name] omeprazole (PRILOSEC) 20 MG capsule Take 1 capsule (20 mg total) by mouth every morning. 90 capsule 1  . pravastatin (PRAVACHOL) 40 MG tablet Take 1 tablet (40 mg total) by mouth daily. 90 tablet 1  . sodium chloride (OCEAN) 0.65 % nasal spray Place 1 spray into both nostrils at bedtime as needed for congestion.     No current facility-administered medications on file prior to visit.    BP 140/70 mmHg  Pulse 74  Temp(Src) 98 F (36.7 C) (Oral)  Resp 18  Ht 5' 6.5" (1.689 m)  Wt 174 lb 6.4 oz (79.107 kg)  BMI 27.73 kg/m2  SpO2 100%    Objective:   Physical Exam  Constitutional: He is oriented to person, place, and time. He appears well-developed and well-nourished. No distress.  HENT:  Head: Normocephalic and atraumatic.  Cardiovascular: Normal rate and regular rhythm.   No murmur heard. Pulmonary/Chest: Effort normal and breath sounds normal. No respiratory distress. He has no wheezes. He has no rales.  Musculoskeletal: He exhibits no edema.  Neurological: He is alert and oriented to person, place, and time.  Skin: Skin is warm and dry.  Psychiatric: He has a normal mood and affect. His behavior is normal. Thought content normal.          Assessment & Plan:  Syncope- during stress test. Felt secondary to beta blocker. HR and BP looks table off of beta blocker. Monitor. Advised pt to follow through with echo and cardiology follow up visit.

## 2016-02-02 NOTE — Telephone Encounter (Signed)
Pt called stating that he has a notarized copy of HC POA at home and he thinks he may have brought [place] 2 part B copies because he is at home and has 2 part A copies. Please call and notify him so he can bring up the part A section if needed.  Ph# [phone]

## 2016-02-02 NOTE — Assessment & Plan Note (Signed)
Improving.  Management per neurology.

## 2016-02-02 NOTE — Telephone Encounter (Signed)
Called and informed pt that we received Parts A, B and C. He requested a copy of Part B to be put of up front for him. Copy placed up front and originals forwarded to Dr. Charlett Blake. JG//CMA

## 2016-02-02 NOTE — Progress Notes (Signed)
Pre visit review using our clinic review tool, if applicable. No additional management support is needed unless otherwise documented below in the visit note. 

## 2016-02-02 NOTE — Patient Instructions (Signed)
You will be contacted about your referral to ENT to check your ears.  Please keep your upcoming appointment with Dr. Johnsie Cancel.

## 2016-02-02 NOTE — Assessment & Plan Note (Signed)
Advised trial of claritin 10 mg once daily.

## 2016-02-02 NOTE — Telephone Encounter (Signed)
Pt dropped off a copy of his [place] for [place], documents placed in [place] at front office

## 2016-02-02 NOTE — Assessment & Plan Note (Signed)
Refer to ENT for further evaluation. ?

## 2016-02-10 ENCOUNTER — Telehealth: Payer: Self-pay | Admitting: Family

## 2016-02-10 ENCOUNTER — Other Ambulatory Visit: Payer: Self-pay

## 2016-02-10 ENCOUNTER — Ambulatory Visit (HOSPITAL_COMMUNITY): Payer: Commercial Managed Care - HMO | Attending: Cardiology

## 2016-02-10 DIAGNOSIS — I119 Hypertensive heart disease without heart failure: Secondary | ICD-10-CM | POA: Insufficient documentation

## 2016-02-10 DIAGNOSIS — R931 Abnormal findings on diagnostic imaging of heart and coronary circulation: Secondary | ICD-10-CM | POA: Diagnosis not present

## 2016-02-10 DIAGNOSIS — I358 Other nonrheumatic aortic valve disorders: Secondary | ICD-10-CM | POA: Diagnosis not present

## 2016-02-10 DIAGNOSIS — I059 Rheumatic mitral valve disease, unspecified: Secondary | ICD-10-CM | POA: Insufficient documentation

## 2016-02-10 NOTE — Telephone Encounter (Signed)
Dr Redmond Baseman 6/20 @1 :81, left detail message with info, ok per [facility]

## 2016-02-10 NOTE — Telephone Encounter (Signed)
°  Relation to [id number] Call back number:[phone]   Reason for call:  Patient checking on the status of ENT referral.

## 2016-02-11 NOTE — Progress Notes (Signed)
Patient ID: TENOCH LOWARY, male   DOB: 09-26-1940, 75 y.o.   MRN: [id number]     CARDIOLOGY OFFICE NOTE  Date:  02/14/2016    [name] Pratt Date of Birth: 03-23-1941 Medical Record [id number]  PCP:  Nance Pear., NP  Cardiologist:  Johnsie Cancel    Chief Complaint  Patient presents with  . Hypertension    have woozy feeling after working outside all day, BP was low, per pt    History of Present Illness:  [name] Pratt is a 75 y.o. male   has a history of HLD, GERD, carotid disease, HTN and COPD. Other issues as noted below. Remote echo from 2009 with normal EF. Normal Myoview from 2008.   Presented to the ER  08/2015  - BP had been elevated for about 2 days - given dose of Clonidine. 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 Intermittent not always exertional lasts minutes  Had "syncopal" episode during myovue ? Vagal  01/25/16 Myovue:  No ischemia or infarct but EF 48%   F/U echo 02/10/16 for EF: normal 55-60% normal PA pressures LAE and AV sclerosis      Past Medical History  Diagnosis Date  . Hyperlipidemia     under control  . Chest pain      Myoview in 2008 was normal.  Echo 7/09: EF 60%, normal wall motion, mild LVH, mild LAE.  . Inguinal hernia   . Pancreatitis   . Rosacea   . Gout   . Arthritis   . History of chicken pox   . GERD (gastroesophageal reflux disease)   . History of hemorrhoids   . OSA (obstructive sleep apnea)   . Carotid stenosis     a. Carotid U/S [id number]: LICA < A999333, RICA 0000000;  b.  Carotid U/S 0000000:  RICA 123456; LICA XX123456; f/u 1 year  .  Hypertension     under control  . COPD (chronic obstructive pulmonary disease) ([place])   . Hypothyroidism   . History of kidney stones   . Cancer ([place])     skin cancer  . Complication of anesthesia     "stomach does not wake up"  . Ileus ([place])   . PONV (postoperative nausea and vomiting)     Past Surgical History  Procedure Laterality Date  . Laparoscopic cholecystectomy  09/20/06    with intraoperative cholangiogram and right inguinal herniorrhaphy with mesh   . Hiatal hernia repair  12/21/93  . Knee surgery  1995  . Hemorrhoid surgery  2014  . Lithotripsy      x2  . Abdominal hernia repair  07/15/12    Dr [name]  . Nasal septum surgery  1975  . Esophageal dilation  1993 and 1994    multiple times  . Brow lift  05/07/01  . Urethral dilation  1991    [name]  . Epidural injections      multiple procedures  . 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 Ligament repair Left 07/2013    shoulder  . Skin cancer destruction  nose, ear  . Inguinal hernia repair Right 07/15/12    Dr [name], x2  . Joint replacement  07/25/04    right knee  . Joint replacement  07/13/10    left knee  . Total hip arthroplasty Left   . Eye surgery Bilateral 03/25/10, 2012    cataract removal  . Refractive surgery Bilateral   . Tonsillectomy  as child  . Cystoscopy with ureteroscopy and stent placement Right 01/28/2014    Procedure: CYSTOSCOPY WITH URETEROSCOPY, BASKET RETRIVAL AND  STENT PLACEMENT;  Surgeon: [name], MD;  Location: [facility];  Service: Urology;  Laterality: Right;  . Holmium laser application Right [id number]    Procedure: HOLMIUM LASER APPLICATION;  Surgeon: [name], MD;  Location: [facility];  Service: Urology;  Laterality: Right;     Medications: Current Outpatient Prescriptions  Medication Sig Dispense Refill  . acetaminophen (TYLENOL) 500 MG tablet Take 500 mg by mouth every 6 (six) hours as needed for moderate pain.    [name] albuterol (PROVENTIL HFA;VENTOLIN HFA) 108 (90  Base) MCG/ACT inhaler Inhale 2 puffs into the lungs every 6 (six) hours as needed for shortness of breath. 3 Inhaler 1  . allopurinol (ZYLOPRIM) 100 MG tablet Take 1 tablet (100 mg total) by mouth daily. 14 tablet 0  . allopurinol (ZYLOPRIM) 300 MG tablet Take 1 tablet (300 mg total) by mouth daily. 90 tablet 1  . aspirin EC 81 MG tablet Take 81 mg by mouth every morning.    . Carboxymethylcellul-Glycerin (OPTIVE) 0.5-0.9 % SOLN Apply 1 drop to eye daily.    . cycloSPORINE (RESTASIS) 0.05 % ophthalmic emulsion Place 1 drop into both eyes 2 (two) times daily.    . diclofenac sodium (VOLTAREN) 1 % GEL Apply 1 application topically daily as needed (arthritis). 100 g 0  . diphenhydrAMINE (BENADRYL) 25 MG tablet Take 25 mg by mouth every 6 (six) hours as needed for allergies.    . fluticasone (FLONASE) 50 MCG/ACT nasal spray Place 2 sprays into both nostrils daily. 48 g 1  . gemfibrozil (LOPID) 600 MG tablet TAKE ONE-HALF TABLET BY MOUTH TWICE DAILY BEFORE  MEALS 14 tablet 0  . hydrALAZINE (APRESOLINE) 50 MG tablet Take 1 tablet (50 mg total) by mouth 3 (three) times daily. 270 tablet 1  . IRON PO Take 50 mcg by mouth daily.    [name] levothyroxine (SYNTHROID, LEVOTHROID) 150 MCG tablet 1 tab by mouth once daily 6 days a week, 1/2 tab one day a week. 78 tablet 1  . losartan-hydrochlorothiazide (HYZAAR) 100-25 MG tablet Take 1 tablet by mouth daily. 90 tablet 3  . multivitamin (THERAGRAN) per tablet Take 1 tablet by mouth daily.      . Omega-3 Fatty Acids (FISH OIL PO) Take 1 capsule by mouth 2 (two) times daily.    [name] omeprazole (PRILOSEC) 20 MG capsule Take 1 capsule (20 mg total) by mouth every morning. 90 capsule 1  . pravastatin (PRAVACHOL) 40 MG tablet Take 1 tablet (40 mg total) by mouth daily. 90 tablet 1  . sodium chloride (OCEAN) 0.65 % nasal spray Place 1 spray into both nostrils at bedtime as needed for congestion.     No current facility-administered medications for this visit.     Allergies: Allergies  Allergen Reactions  . Metoclopramide Hcl     anxiety    Social History: The patient  reports that he quit smoking about 37 years ago. His smoking use included Cigarettes. He quit after 11 years  of use. He has never used smokeless tobacco. He reports that he drinks alcohol. He reports that he does not use illicit drugs.   Family History: The patient's family history includes Cancer in his mother.   Review of Systems: Please see the history of present illness.   Otherwise, the review of systems is positive for none.   All other systems are reviewed and negative.   Physical Exam: VS:  BP 146/66 mmHg  Pulse 70  Ht 5\' 7"  (1.702 m)  Wt 79.888 kg (176 lb 1.9 oz)  BMI 27.58 kg/m2  SpO2 94% .  BMI Body mass index is 27.58 kg/(m^2).  Wt Readings from Last 3 Encounters:  02/14/16 79.888 kg (176 lb 1.9 oz)  02/02/16 79.107 kg (174 lb 6.4 oz)  01/25/16 81.194 kg (179 lb)   BP is   General: Pleasant. Well developed, well nourished and in no acute distress. His weight is increasing.   HEENT: Normal. Neck: Supple left bruit  Cardiac: Regular rate and rhythm. No murmurs, rubs, or gallops. No edema.  Respiratory:  Lungs are clear to auscultation bilaterally with normal work of breathing.  GI: Soft and nontender. I do not appreciate any bruits.  MS: No deformity or atrophy. Gait and ROM intact. Skin: Warm and dry. Color is normal.  Neuro:  Strength and sensation are intact and no gross focal deficits noted.  Psych: Alert, appropriate and with normal affect.   LABORATORY DATA:  EKG:  SB rate 54 RBBB LAFB LVH   Lab Results  Component Value Date   WBC 5.6 11/24/2015   HGB 13.9 11/24/2015   HCT 41.5 11/24/2015   PLT 346.0 11/24/2015   GLUCOSE 124* 11/24/2015   CHOL 142 11/24/2015   TRIG 162.0* 11/24/2015   HDL 32.50* 11/24/2015   LDLCALC 77 11/24/2015   ALT 24 11/24/2015   AST 26 11/24/2015   NA 139 11/24/2015   K 3.6 11/24/2015   CL 102 11/24/2015    CREATININE 1.29 11/24/2015   BUN 29* 11/24/2015   CO2 29 11/24/2015   TSH 0.92 11/24/2015   PSA 0.31 05/26/2013   INR 1.06 12/11/2012   HGBA1C 6.1 11/24/2015    BNP (last 3 results) No results for input(s): BNP in the last 8760 hours.  ProBNP (last 3 results) No results for input(s): PROBNP in the last 8760 hours.   Other Studies Reviewed Today:   Assessment/Plan: 1. HTN -  Improved on current meds his BP cuff inaccurate adjust time of day taking meds Low sodium diet  2. HLD - on statin therapy.   3. Carotid disease - for repeat study in 05/2016 02/2015 bilateral 40-59% ICA stenosis   4. COPD with stable dyspnea.   5. 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  The patient does not have concerns regarding medicines other than what has been noted above.  The following changes have been made:  See above.  Labs/ tests ordered today include: carotid duplex  No orders of the defined types were placed in this encounter.     Disposition:      F.u with me 6 months   Jenkins Rouge

## 2016-02-11 NOTE — Telephone Encounter (Signed)
Received copy of pink MOST form. Documentation placed in EPIC and sent for scanning.

## 2016-02-14 ENCOUNTER — Ambulatory Visit (INDEPENDENT_AMBULATORY_CARE_PROVIDER_SITE_OTHER): Payer: Commercial Managed Care - HMO | Admitting: Cardiovascular Disease

## 2016-02-14 ENCOUNTER — Encounter: Payer: Self-pay | Admitting: Cardiovascular Disease

## 2016-02-14 VITALS — BP 146/66 | HR 70 | Ht 67.0 in | Wt 176.1 lb

## 2016-02-14 DIAGNOSIS — I10 Essential (primary) hypertension: Secondary | ICD-10-CM

## 2016-02-14 DIAGNOSIS — I739 Peripheral vascular disease, unspecified: Secondary | ICD-10-CM

## 2016-02-14 DIAGNOSIS — I779 Disorder of arteries and arterioles, unspecified: Secondary | ICD-10-CM | POA: Diagnosis not present

## 2016-02-14 NOTE — Patient Instructions (Addendum)
Medication Instructions:  Your physician recommends that you continue on your current medications as directed. Please refer to the Current Medication list given to you today.  Labwork: NONE  Testing/Procedures: Your physician has requested that you have a carotid duplex (September). This test is an ultrasound of the carotid arteries in your neck. It looks at blood flow through these arteries that supply the brain with blood. Allow one hour for this exam. There are no restrictions or special instructions.  Follow-Up: Your physician wants you to follow-up in: 6 months with Dr. Johnsie Cancel. You will receive a reminder letter in the mail two months in advance. If you don't receive a letter, please call our office to schedule the follow-up appointment.   If you need a refill on your cardiac medications before your next appointment, please call your pharmacy.

## 2016-02-28 ENCOUNTER — Encounter: Payer: Self-pay | Admitting: Adult Health

## 2016-02-28 ENCOUNTER — Ambulatory Visit (INDEPENDENT_AMBULATORY_CARE_PROVIDER_SITE_OTHER): Payer: Commercial Managed Care - HMO | Admitting: Adult Health

## 2016-02-28 VITALS — BP 149/79 | HR 63 | Resp 20 | Ht 67.0 in | Wt 177.2 lb

## 2016-02-28 DIAGNOSIS — Z9989 Dependence on other enabling machines and devices: Principal | ICD-10-CM

## 2016-02-28 DIAGNOSIS — G4733 Obstructive sleep apnea (adult) (pediatric): Secondary | ICD-10-CM

## 2016-02-28 NOTE — Progress Notes (Signed)
I agree with the assessment and plan as directed by NP .The patient is known to me .   Janann Boeve, MD  

## 2016-02-28 NOTE — Patient Instructions (Signed)
Schedule with Shirlean Mylar about mask refitting If your symptoms worsen or you develop new symptoms please let us know.

## 2016-02-28 NOTE — Progress Notes (Signed)
PATIENT: [name] Pratt DOB: 09/11/1941  REASON FOR VISIT: follow up HISTORY FROM: patient  HISTORY OF PRESENT ILLNESS: [name] Pratt is a 75 year old male with a history of obstructive sleep apnea. He returns today for a compliance download. His download indicates that he uses machine 30 out of 30 days for compliance of 100%. He uses machine every night for greater than 4 hours. On average he uses his machine 8 hours and 1 minute. His residual AHI is a 8.5 on 11 cm of water with EPR of 3. He does not have a significant leak. The patient has always has slightly elevated AHI. He still feels sleepy in the afternoons. However he states that he is no longer falling asleep while driving. He states as long as he is up and moving he is able to stay awake. He does state that occasionally his mask will leak at night especially when he rolls over in bed. He has tried several different mask but he is a mouth breather so the full face mask has worked the best for him. He returns today for an evaluation.  HISTORY 09/02/15: [name] Pratt is a 75 year old male with a history of obstructive sleep apnea. He returns today for a compliance download. At the last visit his pressure settings were adjusted. The patient brought his memory card with him today however we areunable to obtain a download. We will call his DME company to see if they can fax 1 over. The patient reports that he does use the CPAP nightly. He is tolerating the current pressure settings. He states that he typically goes to bed around 11 PM and arises at 7:30 AM. He does wake up during the night however he is able to go right back to sleep.urrently uses the full face mask. He states that he does not have a leak with the mask unless he turns over to his side. At that time if it does start leaking he can adjust this mass and it creates a better seal. Overall he feels that he is doingwell. His Epworth sleepiness score is 10 and fatigue severity score is  25. He returns today for an evaluation.  HISTORY 09/16/14: [name] Pratt is a 75 year old male with history of obstructive sleep Apnea. He returns today for a 30 daydownload. At the last visit his AHI was 13.8 so his pressure setting was adjusted to 9cm. He brought his machine with him today but we are unable to get a recent download. the patient assures me that his DME company changed his settings and he has been using his machine nightly. Overall he doesn't notice a significant change with the pressure change other than sometimes the pressure is "strong" and can blow the mask off. The patient states that he feels that he is getting good sleep. If he sits down in front of a computer or tv after getting up he may tend to doze off. He still notices some daytime sleepiness. He gets about 7 hours of sleep. He does feel that the CPAP has helped.   ADDEDUM: We were able to get a download from the patient's DME: it showed an AHI of 14.4 on 9 cm H2O with EPR 2. His compliance is 90%. No leak noted. He uses his machine on average 6 hours and 40 minutes.   REVIEW OF SYSTEMS: Out of a complete 14 system review of symptoms, the patient complains only of the following symptoms, and all other reviewed systems are negative.  Fatigue,  double vision, hearing loss, shortness of breath, murmur, restless leg, muscle cramps, walking difficulty, neck stiffness, itching, urgency, swollen abdomen, numbness, weakness  Epworth sleepiness score 13 fatigue severity score is 24  ALLERGIES: Allergies  Allergen Reactions  . Metoclopramide Hcl     anxiety    HOME MEDICATIONS: Outpatient Prescriptions Prior to Visit  Medication Sig Dispense Refill  . acetaminophen (TYLENOL) 500 MG tablet Take 500 mg by mouth every 6 (six) hours as needed for moderate pain.    [name] albuterol (PROVENTIL HFA;VENTOLIN HFA) 108 (90 Base) MCG/ACT inhaler Inhale 2 puffs into the lungs every 6 (six) hours as needed for shortness of breath. 3 Inhaler 1  .  allopurinol (ZYLOPRIM) 100 MG tablet Take 1 tablet (100 mg total) by mouth daily. 14 tablet 0  . allopurinol (ZYLOPRIM) 300 MG tablet Take 1 tablet (300 mg total) by mouth daily. 90 tablet 1  . aspirin EC 81 MG tablet Take 81 mg by mouth every morning.    . Carboxymethylcellul-Glycerin (OPTIVE) 0.5-0.9 % SOLN Apply 1 drop to eye daily.    . cycloSPORINE (RESTASIS) 0.05 % ophthalmic emulsion Place 1 drop into both eyes 2 (two) times daily.    . diclofenac sodium (VOLTAREN) 1 % GEL Apply 1 application topically daily as needed (arthritis). 100 g 0  . diphenhydrAMINE (BENADRYL) 25 MG tablet Take 25 mg by mouth every 6 (six) hours as needed for allergies.    . fluticasone (FLONASE) 50 MCG/ACT nasal spray Place 2 sprays into both nostrils daily. 48 g 1  . gemfibrozil (LOPID) 600 MG tablet TAKE ONE-HALF TABLET BY MOUTH TWICE DAILY BEFORE  MEALS 14 tablet 0  . hydrALAZINE (APRESOLINE) 50 MG tablet Take 1 tablet (50 mg total) by mouth 3 (three) times daily. 270 tablet 1  . IRON PO Take 50 mcg by mouth daily.    [name] levothyroxine (SYNTHROID, LEVOTHROID) 150 MCG tablet 1 tab by mouth once daily 6 days a week, 1/2 tab one day a week. 78 tablet 1  . losartan-hydrochlorothiazide (HYZAAR) 100-25 MG tablet Take 1 tablet by mouth daily. 90 tablet 3  . multivitamin (THERAGRAN) per tablet Take 1 tablet by mouth daily.      . Omega-3 Fatty Acids (FISH OIL PO) Take 1 capsule by mouth 2 (two) times daily.    [name] omeprazole (PRILOSEC) 20 MG capsule Take 1 capsule (20 mg total) by mouth every morning. 90 capsule 1  . pravastatin (PRAVACHOL) 40 MG tablet Take 1 tablet (40 mg total) by mouth daily. 90 tablet 1  . sodium chloride (OCEAN) 0.65 % nasal spray Place 1 spray into both nostrils at bedtime as needed for congestion.     No facility-administered medications prior to visit.    PAST MEDICAL HISTORY: Past Medical History  Diagnosis Date  . Hyperlipidemia     under control  . Chest pain      Myoview in 2008 was  normal.  Echo 7/09: EF 60%, normal wall motion, mild LVH, mild LAE.  . Inguinal hernia   . Pancreatitis   . Rosacea   . Gout   . Arthritis   . History of chicken pox   . GERD (gastroesophageal reflux disease)   . History of hemorrhoids   . OSA (obstructive sleep apnea)   . Carotid stenosis     a. Carotid U/S [id number]: LICA < [id number], RICA 0000000;  b.  Carotid U/S 0000000:  RICA 123456; LICA [id number]; f/u 1 year  . Hypertension  under control  . COPD (chronic obstructive pulmonary disease) ([place])   . Hypothyroidism   . History of kidney stones   . Cancer ([place])     skin cancer  . Complication of anesthesia     "stomach does not wake up"  . Ileus ([place])   . PONV (postoperative nausea and vomiting)     PAST SURGICAL HISTORY: Past Surgical History  Procedure Laterality Date  . Laparoscopic cholecystectomy  09/20/06    with intraoperative cholangiogram and right inguinal herniorrhaphy with mesh   . Hiatal hernia repair  12/21/93  . Knee surgery  1995  . Hemorrhoid surgery  2014  . Lithotripsy      x2  . Abdominal hernia repair  07/15/12    Dr [name]  . Nasal septum surgery  1975  . Esophageal dilation  1993 and 1994    multiple times  . Brow lift  05/07/01  . Urethral dilation  1991    [name]  . Epidural injections      multiple procedures  . 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 Ligament repair Left 07/2013    shoulder  . Skin cancer destruction      nose, ear  . Inguinal hernia repair Right 07/15/12    Dr [name], x2  . Joint replacement  07/25/04    right knee  . Joint replacement  07/13/10    left knee  . Total hip arthroplasty Left   . Eye surgery Bilateral 03/25/10, 2012    cataract removal  . Refractive surgery Bilateral   . Tonsillectomy  as child  . Cystoscopy with ureteroscopy and stent placement Right 01/28/2014    Procedure: CYSTOSCOPY WITH URETEROSCOPY, BASKET RETRIVAL AND  STENT PLACEMENT;  Surgeon: [name], MD;  Location: [facility];  Service: Urology;   Laterality: Right;  . Holmium laser application Right [id number]    Procedure: HOLMIUM LASER APPLICATION;  Surgeon: [name], MD;  Location: [facility];  Service: Urology;  Laterality: Right;    FAMILY HISTORY: Family History  Problem Relation Age of Onset  . Hypertension    . Cancer    . Osteoarthritis    . Cancer Mother     SOCIAL HISTORY: Social History   Social History  . Marital Status: Married    Spouse Name: N/A  . Number of Children: 1  . 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 Smoking status: Former Smoker -- 11 years    Types: Cigarettes    Quit date: 09/11/1978  . Smokeless tobacco: Never Used  . Alcohol Use: 0.0 oz/week    0 Standard drinks or equivalent per week     Comment: rare  . Drug Use: No  . Sexual Activity: Not on file   Other Topics Concern  . Not on file   Social History Narrative   Lives with his wife   He has one son- lives locally.   2 grandchildren   Has worked for Research [employer], trade [employer]   Enjoys wood working   Completed HS.  [employer]         PHYSICAL EXAM  Filed Vitals:   02/28/16 1347  BP: 149/79  Pulse: 63  Resp: 20  Height: 5\' 7"  (1.702 m)  Weight: 177 lb 3.2 oz (80.377 kg)   Body mass index is 27.75 kg/(m^2).  Generalized: Well developed, in no acute  distress  Neck: Neck circumference 16.5 inches, Mallampati 2+  Neurological examination  Mentation: Alert oriented to time, place, history taking. Follows all commands speech and language fluent Cranial nerve II-XII: Pupils were equal round reactive to light. Extraocular movements were full, visual field were full on confrontational test. Facial sensation and strength were normal. Uvula tongue midline. Head turning and shoulder shrug  were normal and symmetric. Motor: The motor testing reveals 5 over 5 strength of all 4 extremities. Good symmetric motor tone is noted throughout.  Sensory:  Sensory testing is intact to soft touch on all 4 extremities. No evidence of extinction is noted.  Coordination: Cerebellar testing reveals good finger-nose-finger and heel-to-shin bilaterally.  Gait and station: Gait is normal.  Reflexes: Deep tendon reflexes are symmetric and normal bilaterally.   DIAGNOSTIC DATA (LABS, IMAGING, TESTING) - I reviewed patient records, labs, notes, testing and imaging myself where available.  Lab Results  Component Value Date   WBC 5.6 11/24/2015   HGB 13.9 11/24/2015   HCT 41.5 11/24/2015   MCV 97.4 11/24/2015   PLT 346.0 11/24/2015      Component Value Date/Time   NA 139 11/24/2015 1119   K 3.6 11/24/2015 1119   CL 102 11/24/2015 1119   CO2 29 11/24/2015 1119   GLUCOSE 124* 11/24/2015 1119   BUN 29* 11/24/2015 1119   CREATININE 1.29 11/24/2015 1119   CREATININE 1.44* 09/15/2015 0959   CALCIUM 9.7 11/24/2015 1119   PROT 8.2 11/24/2015 1119   ALBUMIN 4.6 11/24/2015 1119   AST 26 11/24/2015 1119   ALT 24 11/24/2015 1119   ALKPHOS 60 11/24/2015 1119   BILITOT 0.5 11/24/2015 1119   GFRNONAA 42* 08/27/2015 1120   GFRNONAA 73 11/25/2012 1103   GFRAA 48* 08/27/2015 1120   GFRAA 84 11/25/2012 1103   Lab Results  Component Value Date   CHOL 142 11/24/2015   HDL 32.50* 11/24/2015   LDLCALC 77 11/24/2015   TRIG 162.0* 11/24/2015   CHOLHDL 4 11/24/2015   Lab Results  Component Value Date   HGBA1C 6.1 11/24/2015   Lab Results  Component Value Date   [id number] 12/27/2015   Lab Results  Component Value Date   TSH 0.92 11/24/2015      ASSESSMENT AND PLAN 75 y.o. year old male  has a past medical history of Hyperlipidemia; Chest pain; Inguinal hernia; Pancreatitis; Rosacea; Gout; Arthritis; History of chicken pox; GERD (gastroesophageal reflux disease); History of hemorrhoids; OSA (obstructive sleep apnea); Carotid stenosis; Hypertension; COPD (chronic obstructive pulmonary disease) ([place]); Hypothyroidism; History of kidney stones;  Cancer ([place]); Complication of anesthesia; Ileus ([place]); and PONV (postoperative nausea and vomiting). here with:  1. Obstructive sleep apnea on CPAP  Overall the patient is doing well. He has good compliance with the CPAP. He has slightly elevated AHI that has been his baseline. We will make no adjustments today. Patient advised that if his symptoms worsen or he develops any new symptoms he should let us know. He will follow-up in 6 months with Dr. Mechele Claude, MSN, NP-C 02/28/2016, 2:04 PM [facility] [address] [place], [place] [address] [phone]

## 2016-02-29 DIAGNOSIS — H905 Unspecified sensorineural hearing loss: Secondary | ICD-10-CM | POA: Diagnosis not present

## 2016-02-29 DIAGNOSIS — H903 Sensorineural hearing loss, bilateral: Secondary | ICD-10-CM

## 2016-02-29 HISTORY — DX: Sensorineural hearing loss, bilateral: H90.3

## 2016-03-02 ENCOUNTER — Ambulatory Visit: Payer: Medicare Other | Admitting: Adult Health

## 2016-03-03 ENCOUNTER — Other Ambulatory Visit: Payer: Self-pay | Admitting: Physician Assistant

## 2016-03-03 DIAGNOSIS — H905 Unspecified sensorineural hearing loss: Secondary | ICD-10-CM

## 2016-03-03 DIAGNOSIS — H903 Sensorineural hearing loss, bilateral: Secondary | ICD-10-CM

## 2016-03-06 ENCOUNTER — Other Ambulatory Visit: Payer: Self-pay | Admitting: Physician Assistant

## 2016-03-06 DIAGNOSIS — H905 Unspecified sensorineural hearing loss: Secondary | ICD-10-CM

## 2016-03-06 DIAGNOSIS — H903 Sensorineural hearing loss, bilateral: Secondary | ICD-10-CM

## 2016-03-13 ENCOUNTER — Other Ambulatory Visit: Payer: Self-pay | Admitting: Family

## 2016-03-13 NOTE — Telephone Encounter (Signed)
Rx request to pharmacy/SLS 07/03

## 2016-03-13 NOTE — Telephone Encounter (Signed)
Rx request to pharmacy/SLS  

## 2016-03-15 ENCOUNTER — Ambulatory Visit
Admission: RE | Admit: 2016-03-15 | Discharge: 2016-03-15 | Disposition: A | Discharge status: Home / Self Care | Payer: Commercial Managed Care - HMO | Source: Ambulatory Visit | Attending: Physician Assistant | Admitting: Physician Assistant

## 2016-03-15 DIAGNOSIS — H905 Unspecified sensorineural hearing loss: Secondary | ICD-10-CM

## 2016-03-15 DIAGNOSIS — H903 Sensorineural hearing loss, bilateral: Secondary | ICD-10-CM | POA: Diagnosis not present

## 2016-03-15 DIAGNOSIS — Z01818 Encounter for other preprocedural examination: Secondary | ICD-10-CM | POA: Diagnosis not present

## 2016-03-15 MED ORDER — GADOBENATE DIMEGLUMINE 529 MG/ML IV SOLN
15.0000 mL | Freq: Once | INTRAVENOUS | Status: AC | PRN
  Administered 2016-03-15: 15 mL via INTRAVENOUS

## 2016-04-13 ENCOUNTER — Telehealth: Payer: Self-pay | Admitting: Family

## 2016-04-13 DIAGNOSIS — L719 Rosacea, unspecified: Secondary | ICD-10-CM

## 2016-04-13 NOTE — Telephone Encounter (Signed)
Relation to [id number] Call back [place]:  Reason for call:  Patient requesting a referral to Force. Hollar, MD [facility] due to a routine check. Please advise

## 2016-04-18 DIAGNOSIS — G4733 Obstructive sleep apnea (adult) (pediatric): Secondary | ICD-10-CM | POA: Diagnosis not present

## 2016-04-19 ENCOUNTER — Other Ambulatory Visit: Payer: Self-pay | Admitting: Family

## 2016-04-20 DIAGNOSIS — G4731 Primary central sleep apnea: Secondary | ICD-10-CM | POA: Diagnosis not present

## 2016-04-20 DIAGNOSIS — H35071 Retinal telangiectasis, right eye: Secondary | ICD-10-CM | POA: Diagnosis not present

## 2016-04-20 DIAGNOSIS — H35073 Retinal telangiectasis, bilateral: Secondary | ICD-10-CM | POA: Diagnosis not present

## 2016-04-20 DIAGNOSIS — H35352 Cystoid macular degeneration, left eye: Secondary | ICD-10-CM | POA: Diagnosis not present

## 2016-04-20 DIAGNOSIS — H35072 Retinal telangiectasis, left eye: Secondary | ICD-10-CM | POA: Diagnosis not present

## 2016-05-03 ENCOUNTER — Telehealth: Payer: Self-pay | Admitting: Family

## 2016-05-03 NOTE — Telephone Encounter (Signed)
°  Relation to [id number] Call back number:[phone]  Reason for call:  Patient requesting a Rx for 2 hearing aids, requesting prescription please fax to [facility], [place], [place], [place] [address]

## 2016-05-03 NOTE — Telephone Encounter (Signed)
Spoke with pt. He states he had a formal hearing evaluation and under [employer] and had 2nd opinion with ENT that stated pt did need hearing aids. Pt was unsure if he needed a referral to the [place] or if he could take a prescription to them for hearing aids. Advised pt that I am unsure of the [facility] process but we could place referral to them if he had the doctor's name. Pt states he has left a message with the [facility] to call him back about next steps and he will let us know once he hears back from them. Advised pt if referral is not needed and they will accept Rx for hearing aids pt should contact most recent ENT for prescription. Pt voices understanding.

## 2016-05-18 DIAGNOSIS — Z85820 Personal history of malignant melanoma of skin: Secondary | ICD-10-CM | POA: Diagnosis not present

## 2016-05-18 DIAGNOSIS — Z08 Encounter for follow-up examination after completed treatment for malignant neoplasm: Secondary | ICD-10-CM | POA: Diagnosis not present

## 2016-05-18 DIAGNOSIS — Z85828 Personal history of other malignant neoplasm of skin: Secondary | ICD-10-CM | POA: Diagnosis not present

## 2016-05-18 DIAGNOSIS — L820 Inflamed seborrheic keratosis: Secondary | ICD-10-CM | POA: Diagnosis not present

## 2016-05-18 DIAGNOSIS — L570 Actinic keratosis: Secondary | ICD-10-CM | POA: Diagnosis not present

## 2016-05-19 ENCOUNTER — Encounter: Payer: Self-pay | Admitting: Family

## 2016-05-19 ENCOUNTER — Ambulatory Visit (INDEPENDENT_AMBULATORY_CARE_PROVIDER_SITE_OTHER): Payer: Commercial Managed Care - HMO | Admitting: Family

## 2016-05-19 ENCOUNTER — Telehealth: Payer: Self-pay | Admitting: Family Medicine

## 2016-05-19 ENCOUNTER — Ambulatory Visit (HOSPITAL_BASED_OUTPATIENT_CLINIC_OR_DEPARTMENT_OTHER)
Admission: RE | Admit: 2016-05-19 | Discharge: 2016-05-19 | Disposition: A | Discharge status: Home / Self Care | Payer: Commercial Managed Care - HMO | Source: Ambulatory Visit | Attending: Family | Admitting: Family

## 2016-05-19 VITALS — BP 170/76 | HR 63 | Temp 98.1°F | Resp 17 | Ht 67.0 in | Wt 175.2 lb

## 2016-05-19 DIAGNOSIS — R05 Cough: Secondary | ICD-10-CM

## 2016-05-19 DIAGNOSIS — I10 Essential (primary) hypertension: Secondary | ICD-10-CM | POA: Diagnosis not present

## 2016-05-19 DIAGNOSIS — I700 Atherosclerosis of aorta: Secondary | ICD-10-CM | POA: Diagnosis not present

## 2016-05-19 DIAGNOSIS — R0602 Shortness of breath: Secondary | ICD-10-CM | POA: Diagnosis not present

## 2016-05-19 DIAGNOSIS — R042 Hemoptysis: Secondary | ICD-10-CM | POA: Insufficient documentation

## 2016-05-19 DIAGNOSIS — R059 Cough, unspecified: Secondary | ICD-10-CM

## 2016-05-19 DIAGNOSIS — J449 Chronic obstructive pulmonary disease, unspecified: Secondary | ICD-10-CM | POA: Insufficient documentation

## 2016-05-19 LAB — D-DIMER, QUANTITATIVE (NOT AT ARMC): D DIMER QUANT: 0.59 ug{FEU}/mL — AB (ref <0.50)

## 2016-05-19 MED ORDER — AMLODIPINE BESYLATE 5 MG PO TABS: 5.0000 mg | ORAL_TABLET | Freq: Every day | ORAL | 3 refills | Status: DC

## 2016-05-19 MED ORDER — CLONIDINE HCL 0.1 MG PO TABS: 0.1000 mg | ORAL_TABLET | Freq: Once | ORAL | Status: DC

## 2016-05-19 NOTE — Progress Notes (Addendum)
Subjective:    Patient ID: [name] Pratt, male    DOB: [birth year], 75 y.o.   MRN: [id number]  HPI  Mr. Buetow is a 75 yr old male who presents today with c/o productive cough x 1 week.  Cough is mild.  He reports multiple episodes of blood tinged sputum. Denies fever.  Has some mild sinus drainage.  Reports some sob which occurred on Monday while on the elliptical machine. Reports sob resolved with rest.  Typically does not use the ellipitical. Reports that he was on the elliptical machine.  He mowed the lawn this past Saturday without SOB.   HTN- pt is maintained on hyzaar and hydralazine. He was previously on a beta blocker which was discontinued due to bradycardia during stress test.   Review of Systems See HPI  Past Medical History:  Diagnosis Date  . Arthritis   . Cancer ([place])    skin cancer  . Carotid stenosis    a. Carotid U/S [id number]: LICA < [id number], RICA 0000000;  b.  Carotid U/S 0000000:  RICA 123456; LICA [id number]; f/u 1 year  . Chest pain     Myoview in 2008 was normal.  Echo 7/09: EF 60%, normal wall motion, mild LVH, mild LAE.  [name] Complication of anesthesia    "stomach does not wake up"  . COPD (chronic obstructive pulmonary disease) ([place])   . GERD (gastroesophageal reflux disease)   . Gout   . History of chicken pox   . History of hemorrhoids   . History of kidney stones   . Hyperlipidemia    under control  . Hypertension    under control  . Hypothyroidism   . Ileus ([place])   . Inguinal hernia   . OSA (obstructive sleep apnea)   . Pancreatitis   . PONV (postoperative nausea and vomiting)   . Rosacea      Social History   Social History  . Marital status: Married    Spouse name: N/A  . Number of children: 1  . Years of education: N/A   Occupational History  . firefighter     paints on the side   Social History Main Topics  . Smoking status: Former Smoker    Years: 11.00    Types: Cigarettes    Quit date: 09/11/1978  . Smokeless tobacco: Never Used  .  Alcohol use 0.0 oz/week     Comment: rare  . Drug use: No  . Sexual activity: Not on file   Other Topics Concern  . Not on file   Social History Narrative   Lives with his wife   He has one son- lives locally.   2 grandchildren   Has worked for [employer], trade union   Enjoys wood working   Completed HS.  [employer]       Past Surgical History:  Procedure Laterality Date  . ABDOMINAL HERNIA REPAIR  07/15/12   Dr [name]  . BROW LIFT  05/07/01  . CYSTOSCOPY WITH URETEROSCOPY AND STENT PLACEMENT Right 01/28/2014   Procedure: CYSTOSCOPY WITH URETEROSCOPY, BASKET RETRIVAL AND  STENT PLACEMENT;  Surgeon: [name], MD;  Location: [facility];  Service: Urology;  Laterality: Right;  . epidural injections     multiple procedures  . [place]   multiple times  . EYE SURGERY Bilateral 03/25/10, 2012   cataract removal  . HEMORRHOID SURGERY  2014  . HIATAL HERNIA REPAIR  12/21/93  . HOLMIUM LASER APPLICATION Right  01/28/2014   Procedure: HOLMIUM LASER APPLICATION;  Surgeon: [name], MD;  Location: [facility];  Service: Urology;  Laterality: Right;  . INGUINAL HERNIA REPAIR Right 07/15/12   Dr [name], x2  . JOINT REPLACEMENT  07/25/04   right knee  . JOINT REPLACEMENT  07/13/10   left knee  . KNEE SURGERY  1995  . LAPAROSCOPIC CHOLECYSTECTOMY  09/20/06   with intraoperative cholangiogram and right inguinal herniorrhaphy with mesh   . LIGAMENT REPAIR Left 07/2013   shoulder  . LITHOTRIPSY     x2  . NASAL SEPTUM SURGERY  1975  . REFRACTIVE SURGERY Bilateral   . 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 SKIN CANCER DESTRUCTION     nose, ear  . TONSILLECTOMY  as child  . TOTAL HIP ARTHROPLASTY Left   . URETHRAL DILATION  1991   [name]    Family History  Problem Relation Age of Onset  . Hypertension    . Cancer    . Osteoarthritis    . Cancer Mother     Allergies  Allergen Reactions  . Metoclopramide Hcl     anxiety    Current Outpatient  Prescriptions on File Prior to Visit  Medication Sig Dispense Refill  . acetaminophen (TYLENOL) 500 MG tablet Take 500 mg by mouth every 6 (six) hours as needed for moderate pain.    [name] albuterol (PROVENTIL HFA;VENTOLIN HFA) 108 (90 Base) MCG/ACT inhaler Inhale 2 puffs into the lungs every 6 (six) hours as needed for shortness of breath. 3 Inhaler 1  . allopurinol (ZYLOPRIM) 100 MG tablet Take 1 tablet (100 mg total) by mouth daily. 90 tablet 1  . allopurinol (ZYLOPRIM) 300 MG tablet TAKE 1 TABLET ONE TIME DAILY 90 tablet 1  . aspirin EC 81 MG tablet Take 81 mg by mouth every morning.    . Carboxymethylcellul-Glycerin (OPTIVE) 0.5-0.9 % SOLN Apply 1 drop to eye daily.    . cycloSPORINE (RESTASIS) 0.05 % ophthalmic emulsion Place 1 drop into both eyes 2 (two) times daily.    . diclofenac sodium (VOLTAREN) 1 % GEL Apply 1 application topically daily as needed (arthritis). 100 g 0  . diphenhydrAMINE (BENADRYL) 25 MG tablet Take 25 mg by mouth every 6 (six) hours as needed for allergies.    . fluticasone (FLONASE) 50 MCG/ACT nasal spray Place 2 sprays into both nostrils daily. 48 g 1  . gemfibrozil (LOPID) 600 MG tablet TAKE 1/2 TABLET TWICE DAILY BEFORE MEALS 90 tablet 0  . hydrALAZINE (APRESOLINE) 50 MG tablet Take 1 tablet (50 mg total) by mouth 3 (three) times daily. 270 tablet 1  . IRON PO Take 50 mcg by mouth daily.    [name] levothyroxine (SYNTHROID, LEVOTHROID) 150 MCG tablet TAKE 1 TABLET ONE TIME DAILY 6 DAYS A WEEK AND 1/2 TABLET 1 DAY A WEEK 78 tablet 1  . losartan-hydrochlorothiazide (HYZAAR) 100-25 MG tablet Take 1 tablet by mouth daily. 90 tablet 3  . multivitamin (THERAGRAN) per tablet Take 1 tablet by mouth daily.      . Omega-3 Fatty Acids (FISH OIL PO) Take 1 capsule by mouth 2 (two) times daily.    [name] omeprazole (PRILOSEC) 20 MG capsule Take 1 capsule (20 mg total) by mouth every morning. 90 capsule 1  . pravastatin (PRAVACHOL) 40 MG tablet TAKE 1 TABLET ONE TIME DAILY 90 tablet 0  .  sodium chloride (OCEAN) 0.65 % nasal spray Place 1 spray into both nostrils at bedtime as needed for  congestion.     No current facility-administered medications on file prior to visit.     BP (!) 189/88 (BP Location: Left Arm, Patient Position: Sitting, Cuff Size: Normal)   Pulse 63   Temp 98.1 F (36.7 C) (Oral)   Resp 17   Ht 5\' 7"  (1.702 m)   Wt 175 lb 3.2 oz (79.5 kg)   SpO2 97%   BMI 27.44 kg/m       Objective:   Physical Exam  Constitutional: He is oriented to person, place, and time. He appears well-developed and well-nourished. No distress.  HENT:  Head: Normocephalic and atraumatic.  Cardiovascular: Normal rate and regular rhythm.   No murmur heard. Pulmonary/Chest: Effort normal and breath sounds normal. No respiratory distress. He has no wheezes. He has no rales.  Musculoskeletal: He exhibits no edema.  Neurological: He is alert and oriented to person, place, and time.  Skin: Skin is warm and dry.  Psychiatric: He has a normal mood and affect. His behavior is normal. Thought content normal.          Assessment & Plan:  EKG shows RBBB, LAFB- so significant change compared to prior.   Hemoptysis- CXR is performed and is clear.  D dimer very mildly elevated.  He is a former smoker.  Will  Refer to pulmonology for further evaluation. Will also obtain a CTA to exclude PE and take a closer look at his lungs.  My suspicion for PE is low.  His SOB which he experienced previously was in the setting of a new exercise regimen (elliptical). He does have a a reduced LVEF 48% noted during recent stress test.  Stress test did not note ischemia.   HTN- uncontrolled. He was given clonidine 0.1mg  in the office and his systolic blood pressure came down to 170.  Pt is advised to increase the hydralazine to tid (he has only been taking bid).  Will also add daily amlodipine.

## 2016-05-19 NOTE — Patient Instructions (Addendum)
Please complete blood work prior to leaving.  Increase hydralazine from 2 x a day to 3 x a day. Begin amlodipine once daily for blood pressure. Complete your x ray on the first floor. You will be contacted about your referral to lung doctor to further evaluate the blood in your mucous.

## 2016-05-19 NOTE — Telephone Encounter (Signed)
On call doc-received after-hours call on elevated d-dimer of 0.59. Normal parameters of d-dimer is up to 0.50. On review of patient's chart he was complaining of shortness of breath and hemoptysis. He was afebrile with stable vital signs. EKG was performed with S1 T3 present. The inverted T waves are new in comparison to prior EKGs. Chest x-ray with no acute pulmonary process. - Patient was contacted this evening to see how he was feeling and discuss labs. He states he's feeling well, no increased shortness of breath, fever, palpitations, cough or additional hemoptysis. He denies any recent travel or leg swelling.  - Advised him a mildly elevated d-dimer can be secondary to many conditions and even mildly elevated with age. However he can also be secondary to a PE. - Advised him on the signs and symptoms/red flags to report to the emergency room if occur over the weekend. Patient voiced understanding and appreciated the call. - Note will be forwarded to primary provider to follow-up on Monday.  Electronically Signed by: Howard Pouch, DO [facility] [place]

## 2016-05-19 NOTE — Progress Notes (Signed)
Pre visit review using our clinic review tool, if applicable. No additional management support is needed unless otherwise documented below in the visit note. 

## 2016-05-20 ENCOUNTER — Telehealth: Payer: Self-pay | Admitting: Family

## 2016-05-20 DIAGNOSIS — R7989 Other specified abnormal findings of blood chemistry: Secondary | ICD-10-CM

## 2016-05-20 NOTE — Telephone Encounter (Signed)
Please contact patient on Monday and let him know that I would like for him to complete a CTA to rule out clot in his lungs and also to take a closer look at his lungs due to the blood in his sputum.  He will need to do a stat bmet on Monday AM and then schedule the CT for later in the day.

## 2016-05-21 NOTE — Telephone Encounter (Signed)
Noted  

## 2016-05-22 ENCOUNTER — Encounter (HOSPITAL_BASED_OUTPATIENT_CLINIC_OR_DEPARTMENT_OTHER): Payer: Self-pay

## 2016-05-22 ENCOUNTER — Ambulatory Visit (HOSPITAL_BASED_OUTPATIENT_CLINIC_OR_DEPARTMENT_OTHER)
Admission: RE | Admit: 2016-05-22 | Discharge: 2016-05-22 | Disposition: A | Discharge status: Home / Self Care | Payer: Commercial Managed Care - HMO | Source: Ambulatory Visit | Attending: Family | Admitting: Family

## 2016-05-22 ENCOUNTER — Encounter: Payer: Self-pay | Admitting: Family

## 2016-05-22 ENCOUNTER — Other Ambulatory Visit (INDEPENDENT_AMBULATORY_CARE_PROVIDER_SITE_OTHER): Payer: Commercial Managed Care - HMO

## 2016-05-22 DIAGNOSIS — K449 Diaphragmatic hernia without obstruction or gangrene: Secondary | ICD-10-CM | POA: Diagnosis not present

## 2016-05-22 DIAGNOSIS — R7989 Other specified abnormal findings of blood chemistry: Secondary | ICD-10-CM

## 2016-05-22 DIAGNOSIS — R791 Abnormal coagulation profile: Secondary | ICD-10-CM

## 2016-05-22 DIAGNOSIS — I700 Atherosclerosis of aorta: Secondary | ICD-10-CM | POA: Diagnosis not present

## 2016-05-22 DIAGNOSIS — R0602 Shortness of breath: Secondary | ICD-10-CM | POA: Diagnosis not present

## 2016-05-22 LAB — BASIC METABOLIC PANEL
BUN: 33 mg/dL — ABNORMAL HIGH (ref 6–23)
CALCIUM: 9.3 mg/dL (ref 8.4–10.5)
CO2: 28 meq/L (ref 19–32)
CREATININE: 1.4 mg/dL (ref 0.40–1.50)
Chloride: 102 mEq/L (ref 96–112)
GFR: 52.33 mL/min — AB (ref >60.00)
GLUCOSE: 116 mg/dL — AB (ref 70–99)
POTASSIUM: 4 meq/L (ref 3.5–5.1)
SODIUM: 140 meq/L (ref 135–145)

## 2016-05-22 MED ORDER — IOPAMIDOL (ISOVUE-370) INJECTION 76%: 80.0000 mL | Freq: Once | INTRAVENOUS | Status: DC | PRN

## 2016-05-22 MED ORDER — IOPAMIDOL (ISOVUE-370) INJECTION 76%
100.0000 mL | Freq: Once | INTRAVENOUS | Status: AC | PRN
Start: 2016-05-22 — End: 2016-05-22
  Administered 2016-05-22: 100 mL via INTRAVENOUS

## 2016-05-22 NOTE — Telephone Encounter (Signed)
Notified pt and he voices understanding. CT scheduled for 1:30pm today. Will do lab this am. Order entered.

## 2016-05-26 ENCOUNTER — Ambulatory Visit (HOSPITAL_COMMUNITY)
Admission: RE | Admit: 2016-05-26 | Discharge: 2016-05-26 | Disposition: A | Discharge status: Home / Self Care | Payer: Commercial Managed Care - HMO | Source: Ambulatory Visit | Attending: Cardiology | Admitting: Cardiology

## 2016-05-26 DIAGNOSIS — I10 Essential (primary) hypertension: Secondary | ICD-10-CM | POA: Diagnosis not present

## 2016-05-26 DIAGNOSIS — Z720 Tobacco use: Secondary | ICD-10-CM | POA: Insufficient documentation

## 2016-05-26 DIAGNOSIS — E785 Hyperlipidemia, unspecified: Secondary | ICD-10-CM | POA: Diagnosis not present

## 2016-05-26 DIAGNOSIS — I6523 Occlusion and stenosis of bilateral carotid arteries: Secondary | ICD-10-CM | POA: Insufficient documentation

## 2016-05-26 DIAGNOSIS — I739 Peripheral vascular disease, unspecified: Secondary | ICD-10-CM

## 2016-05-26 DIAGNOSIS — I779 Disorder of arteries and arterioles, unspecified: Secondary | ICD-10-CM | POA: Diagnosis not present

## 2016-05-31 ENCOUNTER — Telehealth: Payer: Self-pay | Admitting: Family

## 2016-05-31 MED ORDER — OMEPRAZOLE 20 MG PO CPDR: 20.0000 mg | DELAYED_RELEASE_CAPSULE | Freq: Every morning | ORAL | 1 refills | Status: DC

## 2016-05-31 NOTE — Telephone Encounter (Signed)
Spoke with pt and he is needing refill of omeprazole. Refill sent. Reports that he doesn't need any other refills at this time.

## 2016-05-31 NOTE — Telephone Encounter (Signed)
Caller name: Relationship to patient: Self Can be reached: [phone] Pharmacy:  Reason for call: Patient request a call back to discuss medications that he was suppose to have called in on 9/8 when he was in the office.

## 2016-06-19 ENCOUNTER — Encounter: Payer: Self-pay | Admitting: Family

## 2016-06-19 ENCOUNTER — Ambulatory Visit (INDEPENDENT_AMBULATORY_CARE_PROVIDER_SITE_OTHER): Payer: Commercial Managed Care - HMO | Admitting: Family

## 2016-06-19 VITALS — BP 135/65 | HR 71 | Temp 98.0°F | Resp 18 | Ht 67.0 in | Wt 179.4 lb

## 2016-06-19 DIAGNOSIS — I10 Essential (primary) hypertension: Secondary | ICD-10-CM

## 2016-06-19 DIAGNOSIS — G6289 Other specified polyneuropathies: Secondary | ICD-10-CM

## 2016-06-19 DIAGNOSIS — Z23 Encounter for immunization: Secondary | ICD-10-CM

## 2016-06-19 DIAGNOSIS — R042 Hemoptysis: Secondary | ICD-10-CM | POA: Diagnosis not present

## 2016-06-19 DIAGNOSIS — G629 Polyneuropathy, unspecified: Secondary | ICD-10-CM | POA: Diagnosis not present

## 2016-06-19 HISTORY — DX: Polyneuropathy, unspecified: G62.9

## 2016-06-19 LAB — VITAMIN B12: Vitamin B-12: 334 pg/mL (ref 211–911)

## 2016-06-19 LAB — FOLATE: Folate: >23.2 ng/mL

## 2016-06-19 MED ORDER — GABAPENTIN 300 MG PO CAPS: 300.0000 mg | ORAL_CAPSULE | Freq: Three times a day (TID) | ORAL | 3 refills | Status: DC

## 2016-06-19 MED ORDER — GEMFIBROZIL 600 MG PO TABS: ORAL_TABLET | ORAL | 1 refills | Status: DC

## 2016-06-19 MED ORDER — FLUTICASONE PROPIONATE 50 MCG/ACT NA SUSP: 2.0000 | Freq: Every day | NASAL | 1 refills | Status: DC

## 2016-06-19 MED ORDER — HYDRALAZINE HCL 50 MG PO TABS
50.0000 mg | ORAL_TABLET | Freq: Three times a day (TID) | ORAL | 1 refills | Status: DC
Start: 2016-06-19 — End: 2017-03-24

## 2016-06-19 NOTE — Assessment & Plan Note (Signed)
>>  ASSESSMENT AND PLAN FOR BENIGN ESSENTIAL HYPERTENSION WRITTEN ON 06/19/2016  5:35 PM BY O'SULLIVAN, Dalvin Clipper, NP  Stable/improved on current medications.

## 2016-06-19 NOTE — Progress Notes (Signed)
Subjective:    Patient ID: [name] Pratt, male    DOB: August 04, 1941, 75 y.o.   MRN: [id number]  HPI  [name] Pratt is a 75 yr old male who presents today for follow up.  1) HTN- last visit BP was noted to be quite elevated and he was advised to increase his hydralazine from bid to tid.  We also added in a daily dose of amlodipine.  BP Readings from Last 3 Encounters:  06/19/16 135/65  05/19/16 (!) 170/76  02/28/16 (!) 149/79   2) Hemoptysis-  Last visit he reported mild hemoptysis in setting of acute respiratory illness. CTA was neg for PE. Also no pneumonia noted.   3) Foot pain-  Reports intermittent burning in feet/legs.   Review of Systems See HPI  Past Medical History:  Diagnosis Date  . Arthritis   . Cancer ([place])    skin cancer  . Carotid stenosis    a. Carotid U/S [id number]: LICA < [id number], RICA 0000000;  b.  Carotid U/S 0000000:  RICA 123456; LICA [id number]; f/u 1 year  . Chest pain     Myoview in 2008 was normal.  Echo 7/09: EF 60%, normal wall motion, mild LVH, mild LAE.  [name] Complication of anesthesia    "stomach does not wake up"  . COPD (chronic obstructive pulmonary disease) ([place])   . GERD (gastroesophageal reflux disease)   . Gout   . History of chicken pox   . History of hemorrhoids   . History of kidney stones   . Hyperlipidemia    under control  . Hypertension    under control  . Hypothyroidism   . Ileus ([place])   . Inguinal hernia   . OSA (obstructive sleep apnea)   . Pancreatitis   . PONV (postoperative nausea and vomiting)   . Rosacea      Social History   Social History  . Marital status: Married    Spouse name: N/A  . Number of children: 1  . Years of education: N/A   Occupational History  . firefighter     paints on the side   Social History Main Topics  . Smoking status: Former Smoker    Years: 11.00    Types: Cigarettes    Quit date: 09/11/1978  . Smokeless tobacco: Never Used  . Alcohol use 0.0 oz/week     Comment: rare  . Drug use: No  .  Sexual activity: Not on file   Other Topics Concern  . Not on file   Social History Narrative   Lives with his wife   He has one son- lives locally.   2 grandchildren   Has worked for [employer], trade union   Enjoys wood working   Completed HS.  [employer]       Past Surgical History:  Procedure Laterality Date  . ABDOMINAL HERNIA REPAIR  07/15/12   Dr [name]  . BROW LIFT  05/07/01  . CYSTOSCOPY WITH URETEROSCOPY AND STENT PLACEMENT Right 01/28/2014   Procedure: CYSTOSCOPY WITH URETEROSCOPY, BASKET RETRIVAL AND  STENT PLACEMENT;  Surgeon: [name], MD;  Location: [facility];  Service: Urology;  Laterality: Right;  . epidural injections     multiple procedures  . [place]   multiple times  . EYE SURGERY Bilateral 03/25/10, 2012   cataract removal  . HEMORRHOID SURGERY  2014  . HIATAL HERNIA REPAIR  12/21/93  . HOLMIUM LASER APPLICATION Right [id number]   Procedure: HOLMIUM  LASER APPLICATION;  Surgeon: [name], MD;  Location: [facility];  Service: Urology;  Laterality: Right;  . INGUINAL HERNIA REPAIR Right 07/15/12   Dr [name], x2  . JOINT REPLACEMENT  07/25/04   right knee  . JOINT REPLACEMENT  07/13/10   left knee  . KNEE SURGERY  1995  . LAPAROSCOPIC CHOLECYSTECTOMY  09/20/06   with intraoperative cholangiogram and right inguinal herniorrhaphy with mesh   . LIGAMENT REPAIR Left 07/2013   shoulder  . LITHOTRIPSY     x2  . NASAL SEPTUM SURGERY  1975  . REFRACTIVE SURGERY Bilateral   . 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 SKIN CANCER DESTRUCTION     nose, ear  . TONSILLECTOMY  as child  . TOTAL HIP ARTHROPLASTY Left   . URETHRAL DILATION  1991   [name]    Family History  Problem Relation Age of Onset  . Hypertension    . Cancer    . Osteoarthritis    . Cancer Mother     Allergies  Allergen Reactions  . Metoclopramide Hcl     anxiety    Current Outpatient Prescriptions on File Prior to Visit  Medication Sig Dispense Refill  .  acetaminophen (TYLENOL) 500 MG tablet Take 500 mg by mouth every 6 (six) hours as needed for moderate pain.    [name] albuterol (PROVENTIL HFA;VENTOLIN HFA) 108 (90 Base) MCG/ACT inhaler Inhale 2 puffs into the lungs every 6 (six) hours as needed for shortness of breath. 3 Inhaler 1  . allopurinol (ZYLOPRIM) 100 MG tablet Take 1 tablet (100 mg total) by mouth daily. 90 tablet 1  . allopurinol (ZYLOPRIM) 300 MG tablet TAKE 1 TABLET ONE TIME DAILY 90 tablet 1  . amLODipine (NORVASC) 5 MG tablet Take 1 tablet (5 mg total) by mouth daily. 30 tablet 3  . aspirin EC 81 MG tablet Take 81 mg by mouth every morning.    . Carboxymethylcellul-Glycerin (OPTIVE) 0.5-0.9 % SOLN Apply 1 drop to eye daily.    . cycloSPORINE (RESTASIS) 0.05 % ophthalmic emulsion Place 1 drop into both eyes 2 (two) times daily.    . diclofenac sodium (VOLTAREN) 1 % GEL Apply 1 application topically daily as needed (arthritis). 100 g 0  . diphenhydrAMINE (BENADRYL) 25 MG tablet Take 25 mg by mouth every 6 (six) hours as needed for allergies.    . fluticasone (FLONASE) 50 MCG/ACT nasal spray Place 2 sprays into both nostrils daily. 48 g 1  . gemfibrozil (LOPID) 600 MG tablet TAKE 1/2 TABLET TWICE DAILY BEFORE MEALS 90 tablet 0  . hydrALAZINE (APRESOLINE) 50 MG tablet Take 1 tablet (50 mg total) by mouth 3 (three) times daily. 270 tablet 1  . IRON PO Take 50 mcg by mouth daily.    [name] levothyroxine (SYNTHROID, LEVOTHROID) 150 MCG tablet TAKE 1 TABLET ONE TIME DAILY 6 DAYS A WEEK AND 1/2 TABLET 1 DAY A WEEK 78 tablet 1  . losartan-hydrochlorothiazide (HYZAAR) 100-25 MG tablet Take 1 tablet by mouth daily. 90 tablet 3  . multivitamin (THERAGRAN) per tablet Take 1 tablet by mouth daily.      . Omega-3 Fatty Acids (FISH OIL PO) Take 1 capsule by mouth 2 (two) times daily.    [name] omeprazole (PRILOSEC) 20 MG capsule Take 1 capsule (20 mg total) by mouth every morning. 90 capsule 1  . pravastatin (PRAVACHOL) 40 MG tablet TAKE 1 TABLET ONE TIME DAILY  90 tablet 0  . sodium chloride (OCEAN) 0.65 %  nasal spray Place 1 spray into both nostrils at bedtime as needed for congestion.     Current Facility-Administered Medications on File Prior to Visit  Medication Dose Route Frequency Provider Last Rate Last Dose  . cloNIDine (CATAPRES) tablet 0.1 mg  0.1 mg Oral Once [name], NP        BP 135/65   Pulse 71   Temp 98 F (36.7 C) (Oral)   Resp 18   Ht 5\' 7"  (1.702 m)   Wt 179 lb 6.4 oz (81.4 kg)   SpO2 98% Comment: room air  BMI 28.10 kg/m       Objective:   Physical Exam  Constitutional: He is oriented to person, place, and time. He appears well-developed and well-nourished. No distress.  HENT:  Head: Normocephalic and atraumatic.  Cardiovascular: Normal rate and regular rhythm.   No murmur heard. Pulmonary/Chest: Effort normal and breath sounds normal. No respiratory distress. He has no wheezes. He has no rales.  Musculoskeletal: He exhibits no edema.  Neurological: He is alert and oriented to person, place, and time.  Diminished sensation to monofilament bilateral LE   Skin: Skin is warm and dry.  Psychiatric: He has a normal mood and affect. His behavior is normal. Thought content normal.          Assessment & Plan:  Hemoptysis- resolved. I have still recommended that the patient follow through with pulmonary consult given former smoking history.

## 2016-06-19 NOTE — Assessment & Plan Note (Signed)
Stable/improved on current medications.

## 2016-06-19 NOTE — Assessment & Plan Note (Signed)
Symptoms most consistent with peripheral neuropathy. Check b12 and folate levels. Pain is worst at night. Will rx with gabapentin 300mg  po QHS.

## 2016-06-19 NOTE — Patient Instructions (Signed)
Please complete lab work prior to leaving. Begin gabapentin prior to leaving. For your right hand tingling- try purchasing an over the counter wrist brace and wearing while you sleep.  Let me know if this does not  Help your symptoms.

## 2016-06-19 NOTE — Progress Notes (Signed)
Pre visit review using our clinic review tool, if applicable. No additional management support is needed unless otherwise documented below in the visit note. 

## 2016-06-20 ENCOUNTER — Telehealth: Payer: Self-pay | Admitting: Family

## 2016-06-20 ENCOUNTER — Other Ambulatory Visit: Payer: Self-pay | Admitting: Family

## 2016-06-20 MED ORDER — GABAPENTIN 300 MG PO CAPS: 300.0000 mg | ORAL_CAPSULE | Freq: Every day | ORAL | 3 refills | Status: DC

## 2016-06-20 NOTE — Telephone Encounter (Signed)
LVMOM for pt to return my call into the office regarding  Gabapentin.

## 2016-06-20 NOTE — Telephone Encounter (Signed)
Pt returned call into the office stating he has been having issues at night time with numbness and tingling and was prescribed Rx for Gabapentin. Pt wanted to confirm that he should be taking it 3x daily because he was under the impression that he would only take it at night.  Pt reports experiencing drowsiness when he woke up. Pt states even when he went to the doctor with his wife and was still feeling drowsiness.   Pt states he only took 1 tablet yesterday because he just received Rx from pharmacy. And was concerned that he does not want to feel drowsiness and states that he will try taking it 3x daily but still is concerned about drowiness. Pt states he will call back to inform us how he reacts to taking Rx 3x daily. Please advise.

## 2016-06-20 NOTE — Telephone Encounter (Signed)
Pt called in to be advised on a medication. (gabapentin)     CB: [phone]

## 2016-06-20 NOTE — Telephone Encounter (Signed)
Advised pt he should take QHS only for now and that TID was an error. Directions changed in system, pt verbalizes understanding. Notes legs did not hurt last night, slept well but woke up drowsy. He wishes to try for another night or two and if still drowsy we discussed lowering dose to 100mg . He will let me know.

## 2016-07-06 ENCOUNTER — Institutional Professional Consult (permissible substitution): Payer: Commercial Managed Care - HMO | Admitting: Pulmonary Disease

## 2016-07-28 ENCOUNTER — Other Ambulatory Visit: Payer: Self-pay | Admitting: Family

## 2016-07-31 NOTE — Progress Notes (Signed)
Patient ID: [name] Pratt, male   DOB: [birth year], 75 y.o.   MRN: [id number]     CARDIOLOGY OFFICE NOTE  Date:  08/02/2016    [name] Pratt Date of Birth: April 22, 1941 Medical Record [id number]  PCP:  Nance Pear., NP  Cardiologist:  Johnsie Cancel    Chief Complaint  Patient presents with  . Hypertension    History of Present Illness:  [name] Pratt is a 75 y.o. male   has a history of HLD, GERD, carotid disease, HTN and COPD.    Remote echo from 2009 with normal EF. Normal Myoview from 2008.   Presented to the ER  08/2015  - BP had been elevated for about 2 days - given dose of Clonidine. 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 Intermittent not always exertional lasts minutes  Had "syncopal" episode during myovue ? Vagal  01/25/16 Myovue:  No ischemia or infarct but EF 48%   F/U echo 02/10/16 for EF: normal 55-60% normal PA pressures LAE and AV sclerosis      Past Medical History:  Diagnosis Date  . Arthritis   . Cancer ([place])    skin cancer  . Carotid stenosis    a. Carotid U/S [id number]: LICA < [id number], RICA 0000000;  b.  Carotid U/S 0000000:  RICA 123456; LICA XX123456; f/u 1 year  . Chest pain     Myoview in 2008 was normal.  Echo 7/09: EF 60%, normal wall motion, mild LVH, mild LAE.  [name] Complication of anesthesia    "stomach does not wake up"  . COPD (chronic obstructive pulmonary disease) ([place])   . GERD (gastroesophageal reflux disease)   . Gout   . History of chicken pox   . History of hemorrhoids   . History of kidney stones   . Hyperlipidemia    under control  . Hypertension     under control  . Hypothyroidism   . Ileus ([place])   . Inguinal hernia   . OSA (obstructive sleep apnea)   . Pancreatitis   . PONV (postoperative nausea and vomiting)   . Rosacea     Past Surgical History:  Procedure Laterality Date  . ABDOMINAL HERNIA REPAIR  07/15/12   Dr [name]  . BROW LIFT  05/07/01  . CYSTOSCOPY WITH URETEROSCOPY AND STENT PLACEMENT Right 01/28/2014   Procedure: CYSTOSCOPY WITH URETEROSCOPY, BASKET RETRIVAL AND  STENT PLACEMENT;  Surgeon: [name], MD;  Location: [facility];  Service: Urology;  Laterality: Right;  . epidural injections     multiple procedures  . [place]   multiple times  . EYE SURGERY Bilateral 03/25/10, 2012   cataract removal  . HEMORRHOID SURGERY  2014  . HIATAL HERNIA REPAIR  12/21/93  . HOLMIUM LASER APPLICATION Right [id number]   Procedure: HOLMIUM LASER APPLICATION;  Surgeon: [name], MD;  Location: [facility];  Service: Urology;  Laterality: Right;  . INGUINAL HERNIA REPAIR Right 07/15/12   Dr [name], x2  . JOINT REPLACEMENT  07/25/04   right knee  .  JOINT REPLACEMENT  07/13/10   left knee  . KNEE SURGERY  1995  . LAPAROSCOPIC CHOLECYSTECTOMY  09/20/06   with intraoperative cholangiogram and right inguinal herniorrhaphy with mesh   . LIGAMENT REPAIR Left 07/2013   shoulder  . LITHOTRIPSY     x2  . NASAL SEPTUM SURGERY  1975  . REFRACTIVE SURGERY Bilateral   . 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 SKIN CANCER DESTRUCTION     nose, ear  . TONSILLECTOMY  as child  . TOTAL HIP ARTHROPLASTY Left   . URETHRAL DILATION  1991   [name]     Medications: Current Outpatient Prescriptions  Medication Sig Dispense Refill  . acetaminophen (TYLENOL) 500 MG tablet Take 500 mg by mouth every 6 (six) hours as needed for moderate pain.    [name] albuterol (PROVENTIL HFA;VENTOLIN HFA) 108 (90 Base) MCG/ACT inhaler Inhale 2 puffs into the lungs every 6 (six) hours as needed for shortness of breath. 3 Inhaler 1  .  allopurinol (ZYLOPRIM) 100 MG tablet Take 1 tablet (100 mg total) by mouth daily. 90 tablet 1  . allopurinol (ZYLOPRIM) 300 MG tablet TAKE 1 TABLET ONE TIME DAILY 90 tablet 1  . amLODipine (NORVASC) 5 MG tablet TAKE 1 TABLET EVERY DAY 90 tablet 1  . aspirin EC 81 MG tablet Take 81 mg by mouth every morning.    . Carboxymethylcellul-Glycerin (OPTIVE) 0.5-0.9 % SOLN Apply 1 drop to eye daily.    . cycloSPORINE (RESTASIS) 0.05 % ophthalmic emulsion Place 1 drop into both eyes 2 (two) times daily.    . diclofenac sodium (VOLTAREN) 1 % GEL Apply 1 application topically daily as needed (arthritis). 100 g 0  . diphenhydrAMINE (BENADRYL) 25 MG tablet Take 25 mg by mouth every 6 (six) hours as needed for allergies.    . fluticasone (FLONASE) 50 MCG/ACT nasal spray Place 2 sprays into both nostrils daily. 48 g 1  . gabapentin (NEURONTIN) 300 MG capsule Take 1 capsule (300 mg total) by mouth at bedtime. 90 capsule 3  . gemfibrozil (LOPID) 600 MG tablet TAKE 1/2 TABLET TWICE DAILY BEFORE MEALS 90 tablet 1  . hydrALAZINE (APRESOLINE) 50 MG tablet Take 1 tablet (50 mg total) by mouth 3 (three) times daily. 270 tablet 1  . IRON PO Take 50 mcg by mouth daily.    [name] levothyroxine (SYNTHROID, LEVOTHROID) 150 MCG tablet TAKE 1 TABLET ONE TIME DAILY 6 DAYS A WEEK AND 1/2 TABLET 1 DAY A WEEK 78 tablet 1  . losartan-hydrochlorothiazide (HYZAAR) 100-25 MG tablet Take 1 tablet by mouth daily. 90 tablet 3  . multivitamin (THERAGRAN) per tablet Take 1 tablet by mouth daily.      . Omega-3 Fatty Acids (FISH OIL PO) Take 1 capsule by mouth 2 (two) times daily.    [name] omeprazole (PRILOSEC) 20 MG capsule Take 1 capsule (20 mg total) by mouth every morning. 90 capsule 1  . pravastatin (PRAVACHOL) 40 MG tablet TAKE 1 TABLET EVERY DAY 90 tablet 1  . sodium chloride (OCEAN) 0.65 % nasal spray Place 1 spray into both nostrils at bedtime as needed for congestion.     Current Facility-Administered Medications  Medication Dose Route  Frequency Provider Last Rate Last Dose  . cloNIDine (CATAPRES) tablet 0.1 mg  0.1 mg Oral Once [name], NP        Allergies: Allergies  Allergen Reactions  . Metoclopramide Hcl     anxiety    Social History: The patient  reports  that he quit smoking about 37 years ago. His smoking use included Cigarettes. He quit after 11.00 years of use. He has never used smokeless tobacco. He reports that he drinks alcohol. He reports that he does not use drugs.   Family History: The patient's family history includes Cancer in his mother.   Review of Systems: Please see the history of present illness.   Otherwise, the review of systems is positive for none.   All other systems are reviewed and negative.   Physical Exam: VS:  BP (!) 150/90   Pulse 86   Ht 5\' 7"  (1.702 m)   Wt 81.6 kg (180 lb)   BMI 28.19 kg/m  .  BMI Body mass index is 28.19 kg/m.  Wt Readings from Last 3 Encounters:  08/02/16 81.6 kg (180 lb)  06/19/16 81.4 kg (179 lb 6.4 oz)  05/19/16 79.5 kg (175 lb 3.2 oz)   BP is   General: Pleasant. Well developed, well nourished and in no acute distress. His weight is increasing.    HEENT: Normal.  Neck: Supple left bruit  Cardiac: Regular rate and rhythm. No murmurs, rubs, or gallops. No edema.  Respiratory:  Lungs are clear to auscultation bilaterally with normal work of breathing.  GI: Soft and nontender. I do not appreciate any bruits.  MS: No deformity or atrophy. Gait and ROM intact.  Skin: Warm and dry. Color is normal.  Neuro:  Strength and sensation are intact and no gross focal deficits noted.  Psych: Alert, appropriate and with normal affect.   LABORATORY DATA:  EKG:  SB rate 54 RBBB LAFB LVH   Lab Results  Component Value Date   WBC 5.6 11/24/2015   HGB 13.9 11/24/2015   HCT 41.5 11/24/2015   PLT 346.0 11/24/2015   GLUCOSE 116 (H) 05/22/2016   CHOL 142 11/24/2015   TRIG 162.0 (H) 11/24/2015   HDL 32.50 (L) 11/24/2015   LDLCALC 77 11/24/2015     ALT 24 11/24/2015   AST 26 11/24/2015   NA 140 05/22/2016   K 4.0 05/22/2016   CL 102 05/22/2016   CREATININE 1.40 05/22/2016   BUN 33 (H) 05/22/2016   CO2 28 05/22/2016   TSH 0.92 11/24/2015   PSA 0.31 05/26/2013   INR 1.06 12/11/2012   HGBA1C 6.1 11/24/2015    BNP (last 3 results) No results for input(s): BNP in the last 8760 hours.  ProBNP (last 3 results) No results for input(s): PROBNP in the last 8760 hours.   Other Studies Reviewed Today:   Assessment/Plan: 1. HTN -  Improved on current meds his BP cuff inaccurate adjust time of day taking meds Low sodium diet  2. HLD - on statin therapy.   3. Carotid disease -  05/26/16 Duplex 40-59%  Bilateral dx.  F/u duplex 05/2017   4. COPD with stable dyspnea.   5. 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  The patient does not have concerns regarding medicines other than what has been noted above.  The following changes have been made:  See above.  Labs/ tests ordered today include:  -   No orders of the defined types were placed in this encounter.    Disposition:      F.u with me 6 months   Jenkins Rouge

## 2016-08-02 ENCOUNTER — Ambulatory Visit (INDEPENDENT_AMBULATORY_CARE_PROVIDER_SITE_OTHER): Payer: Commercial Managed Care - HMO | Admitting: Cardiovascular Disease

## 2016-08-02 ENCOUNTER — Encounter: Payer: Self-pay | Admitting: Cardiovascular Disease

## 2016-08-02 VITALS — BP 150/90 | HR 86 | Ht 67.0 in | Wt 180.0 lb

## 2016-08-02 DIAGNOSIS — I10 Essential (primary) hypertension: Secondary | ICD-10-CM

## 2016-08-02 NOTE — Patient Instructions (Signed)
Medication Instructions:  Your physician recommends that you continue on your current medications as directed. Please refer to the Current Medication list given to you today.   Labwork: None  Testing/Procedures: None  Follow-Up: Your physician wants you to follow-up in: 6 months with Dr. Nishan. You will receive a reminder letter in the mail two months in advance. If you don't receive a letter, please call our office to schedule the follow-up appointment.   Any Other Special Instructions Will Be Listed Below (If Applicable).     If you need a refill on your cardiac medications before your next appointment, please call your pharmacy.   

## 2016-08-18 ENCOUNTER — Telehealth: Payer: Self-pay | Admitting: Family

## 2016-08-18 NOTE — Telephone Encounter (Signed)
Patient called stating that he received a bill from [place]. He has talked to [facility] and they have told him that they did not receive a referral for the imaging he had done. Patient states he had a referral to an ENT from [facility] and then the ENT doctor sent him to [facility]. This is the imaging that he received a bill for. He is wondering how to go about rectifying the situation. Please advise.   Patient phone: [phone]

## 2016-08-21 NOTE — Telephone Encounter (Signed)
I spoke to the patient and informed him that [employer] is showing the referral was approved on 02/29/16 he gave auth # referred by Dr. Redmond Baseman. Patient is going to follow up with [facility] with auth # says he received a bill for $2000

## 2016-08-22 ENCOUNTER — Telehealth: Payer: Self-pay | Admitting: Family

## 2016-08-22 DIAGNOSIS — H4912 Fourth [trochlear] nerve palsy, left eye: Secondary | ICD-10-CM

## 2016-08-22 NOTE — Telephone Encounter (Signed)
[facility] called stating that patient's authorization had expired since last visit and they would like to get a new one. Patient has appt scheduled for 08/29/16. Please advise.   Doctor- Dr. Brett Fairy  [id number] Dx. [phone]   Phone: [phone]

## 2016-08-22 NOTE — Telephone Encounter (Signed)
Melissa, I have pended referral and diagnosis. Please review and sign and route back to me. Thanks!

## 2016-08-22 NOTE — Telephone Encounter (Signed)
See referral for 12/19 please.

## 2016-08-22 NOTE — Telephone Encounter (Signed)
Signed.

## 2016-08-28 ENCOUNTER — Encounter: Payer: Self-pay | Admitting: Neurology

## 2016-08-29 ENCOUNTER — Encounter: Payer: Self-pay | Admitting: Neurology

## 2016-08-29 ENCOUNTER — Ambulatory Visit (INDEPENDENT_AMBULATORY_CARE_PROVIDER_SITE_OTHER): Payer: Commercial Managed Care - HMO | Admitting: Neurology

## 2016-08-29 ENCOUNTER — Ambulatory Visit: Payer: Commercial Managed Care - HMO | Admitting: Adult Health

## 2016-08-29 VITALS — BP 134/60 | HR 82 | Resp 16 | Ht 67.0 in | Wt 182.0 lb

## 2016-08-29 DIAGNOSIS — G4731 Primary central sleep apnea: Secondary | ICD-10-CM

## 2016-08-29 NOTE — Progress Notes (Signed)
PATIENT: [name] Pratt DOB: 26-Oct-1940  REASON FOR VISIT: follow up HISTORY FROM: patient  HISTORY OF PRESENT ILLNESS:  Interval history from 08/29/2016. [name] Pratt is an established CPAP user, and frequently had high residual AHI eyes. In 2015 his AHI was 13.8 and his pressure on the machine was adjusted to 9 cm water. In 2016 his AHI was 8.5. Today it is back to 13.7 with high compliance he has used the machine 100% over 4 hours nightly, with 7 hours 37 minutes on average, CPAP was set to 11 cm water with 3 cm EPR and the apnea index is 9.8. Mostly obstructive apneas. He does have one point for central apneas per hour of sleep and 8.4 obstructive apneas. I would like for him to be on an auto titrating if possible. I'm not sure his machine which is an S9 Elite can be set this way if not I will ask to adjust the pressure to 11.5 cm a tiny increase. He had tried it once at 13 cm water was highly uncomfortable. He doesn't wake up tired but he goes to sleep easier. CD    MM, 02-2016 [name] Pratt is a 75 year old male with a history of obstructive sleep apnea. He returns today for a compliance download. His download indicates that he uses machine 30 out of 30 days for compliance of 100%. He uses machine every night for greater than 4 hours. On average he uses his machine 8 hours and 1 minute. His residual AHI is a 8.5 on 11 cm of water with EPR of 3. He does not have a significant leak. The patient has always has slightly elevated AHI. He still feels sleepy in the afternoons. However he states that he is no longer falling asleep while driving. He states as long as he is up and moving he is able to stay awake. He does state that occasionally his mask will leak at night especially when he rolls over in bed. He has tried several different mask but he is a mouth breather so the full face mask has worked the best for him. He returns today for an evaluation.  HISTORY 09/02/15: [name] Pratt is a  75 year old male with a history of obstructive sleep apnea. He returns today for a compliance download. At the last visit his pressure settings were adjusted. The patient brought his memory card with him today however we areunable to obtain a download. We will call his DME company to see if they can fax 1 over. The patient reports that he does use the CPAP nightly. He is tolerating the current pressure settings. He states that he typically goes to bed around 11 PM and arises at 7:30 AM. He does wake up during the night however he is able to go right back to sleep.urrently uses the full face mask. He states that he does not have a leak with the mask unless he turns over to his side. At that time if it does start leaking he can adjust this mass and it creates a better seal. Overall he feels that he is doingwell. His Epworth sleepiness score is 10 and fatigue severity score is 25. He returns today for an evaluation.  HISTORY 09/16/14: [name] Pratt is a 75 year old male with history of obstructive sleep Apnea. He returns today for a 30 daydownload. At the last visit his AHI was 13.8 so his pressure setting was adjusted to 9cm. He brought his machine with him today but we are unable  to get a recent download. the patient assures me that his DME company changed his settings and he has been using his machine nightly. Overall he doesn't notice a significant change with the pressure change other than sometimes the pressure is "strong" and can blow the mask off. The patient states that he feels that he is getting good sleep. If he sits down in front of a computer or tv after getting up he may tend to doze off. He still notices some daytime sleepiness. He gets about 7 hours of sleep. He does feel that the CPAP has helped.   ADDEDUM: We were able to get a download from the patient's DME: it showed an AHI of 14.4 on 9 cm H2O with EPR 2. His compliance is 90%. No leak noted. He uses his machine on average 6 hours and 40  minutes.   REVIEW OF SYSTEMS: Out of a complete 14 system review of symptoms, the patient complains only of the following symptoms, and all other reviewed systems are negative.  Fatigue, double vision, hearing loss, shortness of breath, murmur, restless leg, muscle cramps, walking difficulty, neck stiffness, itching, urgency, swollen abdomen, numbness, weakness  Epworth sleepiness score 13 fatigue severity score is 24  ALLERGIES: Allergies  Allergen Reactions  . Metoclopramide Hcl     anxiety    HOME MEDICATIONS: Outpatient Medications Prior to Visit  Medication Sig Dispense Refill  . acetaminophen (TYLENOL) 500 MG tablet Take 500 mg by mouth every 6 (six) hours as needed for moderate pain.    [name] albuterol (PROVENTIL HFA;VENTOLIN HFA) 108 (90 Base) MCG/ACT inhaler Inhale 2 puffs into the lungs every 6 (six) hours as needed for shortness of breath. 3 Inhaler 1  . allopurinol (ZYLOPRIM) 100 MG tablet Take 1 tablet (100 mg total) by mouth daily. 90 tablet 1  . allopurinol (ZYLOPRIM) 300 MG tablet TAKE 1 TABLET ONE TIME DAILY 90 tablet 1  . amLODipine (NORVASC) 5 MG tablet TAKE 1 TABLET EVERY DAY 90 tablet 1  . aspirin EC 81 MG tablet Take 81 mg by mouth every morning.    . Carboxymethylcellul-Glycerin (OPTIVE) 0.5-0.9 % SOLN Apply 1 drop to eye daily.    . cycloSPORINE (RESTASIS) 0.05 % ophthalmic emulsion Place 1 drop into both eyes 2 (two) times daily.    . diclofenac sodium (VOLTAREN) 1 % GEL Apply 1 application topically daily as needed (arthritis). 100 g 0  . diphenhydrAMINE (BENADRYL) 25 MG tablet Take 25 mg by mouth every 6 (six) hours as needed for allergies.    . fluticasone (FLONASE) 50 MCG/ACT nasal spray Place 2 sprays into both nostrils daily. 48 g 1  . gabapentin (NEURONTIN) 300 MG capsule Take 1 capsule (300 mg total) by mouth at bedtime. 90 capsule 3  . gemfibrozil (LOPID) 600 MG tablet TAKE 1/2 TABLET TWICE DAILY BEFORE MEALS 90 tablet 1  . hydrALAZINE (APRESOLINE) 50 MG  tablet Take 1 tablet (50 mg total) by mouth 3 (three) times daily. 270 tablet 1  . IRON PO Take 50 mcg by mouth daily.    [name] levothyroxine (SYNTHROID, LEVOTHROID) 150 MCG tablet TAKE 1 TABLET ONE TIME DAILY 6 DAYS A WEEK AND 1/2 TABLET 1 DAY A WEEK 78 tablet 1  . losartan-hydrochlorothiazide (HYZAAR) 100-25 MG tablet Take 1 tablet by mouth daily. 90 tablet 3  . multivitamin (THERAGRAN) per tablet Take 1 tablet by mouth daily.      . Omega-3 Fatty Acids (FISH OIL PO) Take 1 capsule by mouth 2 (two)  times daily.    [name] omeprazole (PRILOSEC) 20 MG capsule Take 1 capsule (20 mg total) by mouth every morning. 90 capsule 1  . pravastatin (PRAVACHOL) 40 MG tablet TAKE 1 TABLET EVERY DAY 90 tablet 1  . sodium chloride (OCEAN) 0.65 % nasal spray Place 1 spray into both nostrils at bedtime as needed for congestion.     Facility-Administered Medications Prior to Visit  Medication Dose Route Frequency Provider Last Rate Last Dose  . cloNIDine (CATAPRES) tablet 0.1 mg  0.1 mg Oral Once [name], NP        PAST MEDICAL HISTORY: Past Medical History:  Diagnosis Date  . Arthritis   . Cancer ([place])    skin cancer  . Carotid stenosis    a. Carotid U/S [id number]: LICA < [id number], RICA 0000000;  b.  Carotid U/S 0000000:  RICA 123456; LICA [id number]; f/u 1 year  . Chest pain     Myoview in 2008 was normal.  Echo 7/09: EF 60%, normal wall motion, mild LVH, mild LAE.  [name] Complication of anesthesia    "stomach does not wake up"  . COPD (chronic obstructive pulmonary disease) ([place])   . GERD (gastroesophageal reflux disease)   . Gout   . History of chicken pox   . History of hemorrhoids   . History of kidney stones   . Hyperlipidemia    under control  . Hypertension    under control  . Hypothyroidism   . Ileus ([place])   . Inguinal hernia   . OSA (obstructive sleep apnea)   . Pancreatitis   . PONV (postoperative nausea and vomiting)   . Rosacea     PAST SURGICAL HISTORY: Past Surgical History:  Procedure  Laterality Date  . ABDOMINAL HERNIA REPAIR  07/15/12   Dr [name]  . BROW LIFT  05/07/01  . CYSTOSCOPY WITH URETEROSCOPY AND STENT PLACEMENT Right 01/28/2014   Procedure: CYSTOSCOPY WITH URETEROSCOPY, BASKET RETRIVAL AND  STENT PLACEMENT;  Surgeon: [name], MD;  Location: [facility];  Service: Urology;  Laterality: Right;  . epidural injections     multiple procedures  . [place]   multiple times  . EYE SURGERY Bilateral 03/25/10, 2012   cataract removal  . HEMORRHOID SURGERY  2014  . HIATAL HERNIA REPAIR  12/21/93  . HOLMIUM LASER APPLICATION Right [id number]   Procedure: HOLMIUM LASER APPLICATION;  Surgeon: [name], MD;  Location: [facility];  Service: Urology;  Laterality: Right;  . INGUINAL HERNIA REPAIR Right 07/15/12   Dr [name], x2  . JOINT REPLACEMENT  07/25/04   right knee  . JOINT REPLACEMENT  07/13/10   left knee  . KNEE SURGERY  1995  . LAPAROSCOPIC CHOLECYSTECTOMY  09/20/06   with intraoperative cholangiogram and right inguinal herniorrhaphy with mesh   . LIGAMENT REPAIR Left 07/2013   shoulder  . LITHOTRIPSY     x2  . NASAL SEPTUM SURGERY  1975  . REFRACTIVE SURGERY Bilateral   . 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 SKIN CANCER DESTRUCTION     nose, ear  . TONSILLECTOMY  as child  . TOTAL HIP ARTHROPLASTY Left   . URETHRAL DILATION  1991   [name]    FAMILY HISTORY: Family History  Problem Relation Age of Onset  . Hypertension    . Cancer    . Osteoarthritis    . Cancer Mother     SOCIAL HISTORY: Social History   Social History  .  Marital status: Married    Spouse name: N/A  . Number of children: 1  . Years of education: N/A   Occupational History  . firefighter     paints on the side   Social History Main Topics  . Smoking status: Former Smoker    Years: 11.00    Types: Cigarettes    Quit date: 09/11/1978  . Smokeless tobacco: Never Used  . Alcohol use 0.0 oz/week     Comment: rare  . Drug use: No  . Sexual  activity: Not on file   Other Topics Concern  . Not on file   Social History Narrative   Lives with his wife   He has one son- lives locally.   2 grandchildren   Has worked for Research officer, trade union   Enjoys wood working   Completed HS.  [employer]         PHYSICAL EXAM  Vitals:   08/29/16 1019  BP: 134/60  Pulse: 82  Resp: 16  Weight: 182 lb (82.6 kg)  Height: 5\' 7"  (1.702 m)   Body mass index is 28.51 kg/m.  Generalized: Well developed, in no acute distress  Neck: Neck circumference 16.5 inches, Mallampati 2+  Neurological examination  Mentation: Alert oriented to time, place, history taking. Follows all commands speech and language fluent Cranial nerve II-XII: Pupils were equal round reactive to light. Extraocular movements were full, visual field were full on confrontational test. Facial sensation and strength were normal. Uvula tongue midline. Head turning and shoulder shrug  were normal and symmetric. Motor: The motor testing reveals 5 over 5 strength of all 4 extremities. Good symmetric motor tone is noted throughout.  Sensory: Sensory testing is intact to soft touch on all 4 extremities. No evidence of extinction is noted.  Coordination: Cerebellar testing reveals good finger-nose-finger and heel-to-shin bilaterally.  Gait and station: Gait is normal.  Reflexes: Deep tendon reflexes are symmetric and normal bilaterally.   DIAGNOSTIC DATA (LABS, IMAGING, TESTING) - I reviewed patient records, labs, notes, testing and imaging myself where available.  Lab Results  Component Value Date   WBC 5.6 11/24/2015   HGB 13.9 11/24/2015   HCT 41.5 11/24/2015   MCV 97.4 11/24/2015   PLT 346.0 11/24/2015      Component Value Date/Time   NA 140 05/22/2016 0852   K 4.0 05/22/2016 0852   CL 102 05/22/2016 0852   CO2 28 05/22/2016 0852   GLUCOSE 116 (H) 05/22/2016 0852   BUN 33 (H) 05/22/2016 0852   CREATININE 1.40 05/22/2016 0852   CREATININE 1.44 (H) 09/15/2015 0959     CALCIUM 9.3 05/22/2016 0852   PROT 8.2 11/24/2015 1119   ALBUMIN 4.6 11/24/2015 1119   AST 26 11/24/2015 1119   ALT 24 11/24/2015 1119   ALKPHOS 60 11/24/2015 1119   BILITOT 0.5 11/24/2015 1119   GFRNONAA 42 (L) 08/27/2015 1120   GFRNONAA 73 11/25/2012 1103   GFRAA 48 (L) 08/27/2015 1120   GFRAA 84 11/25/2012 1103   Lab Results  Component Value Date   CHOL 142 11/24/2015   HDL 32.50 (L) 11/24/2015   LDLCALC 77 11/24/2015   TRIG 162.0 (H) 11/24/2015   CHOLHDL 4 11/24/2015   Lab Results  Component Value Date   HGBA1C 6.1 11/24/2015   Lab Results  Component Value Date   VITAMINB12 334 06/19/2016   Lab Results  Component Value Date   TSH 0.92 11/24/2015      ASSESSMENT AND PLAN 75 y.o. year old  male  has a past medical history of Arthritis; Cancer ([place]); Carotid stenosis; Chest pain; Complication of anesthesia; COPD (chronic obstructive pulmonary disease) ([place]); GERD (gastroesophageal reflux disease); Gout; History of chicken pox; History of hemorrhoids; History of kidney stones; Hyperlipidemia; Hypertension; Hypothyroidism; Ileus ([place]); Inguinal hernia; OSA (obstructive sleep apnea); Pancreatitis; PONV (postoperative nausea and vomiting); and Rosacea. here with:  1. Obstructive sleep apnea on CPAP,  High residual AHI. Has still felt better since using CPAP. He had central apneas at higher pressures.   2. Abducens palsy- ideopathic, followed by  Narda Amber, DO   3. Hearing loss, [facility] paid [facility] hearing aid.   4. Neuropathy, constant  Overall the patient is doing well. He has good compliance with the CPAP.  He has slightly elevated AHI that has been his baseline. We will make no adjustments today. I would like to try auto titration 7-12 cm water.  Patient advised that if his symptoms worsen or he develops any new symptoms he should let us know.  He will follow-up in 6 months with me, Dr. Stefan Church, MD  08/29/2016, 10:54 AM [facility] [address] [place], [place] [address] [phone]

## 2016-09-12 ENCOUNTER — Telehealth: Payer: Self-pay | Admitting: Family

## 2016-09-12 MED ORDER — GABAPENTIN 300 MG PO CAPS: 300.0000 mg | ORAL_CAPSULE | Freq: Every day | ORAL | 1 refills | Status: DC

## 2016-09-12 NOTE — Telephone Encounter (Signed)
Refill sent, notified pt. 

## 2016-09-12 NOTE — Telephone Encounter (Signed)
Caller name: Relationship to patient: Self Can be reached: [phone]  Pharmacy:  [facility], [place] [phone] (Phone) [phone] (Fax)     Reason for call: Request refill on gabapentin (NEURONTIN) 300 MG capsule

## 2016-09-14 ENCOUNTER — Telehealth: Payer: Self-pay | Admitting: Family

## 2016-09-14 NOTE — Telephone Encounter (Signed)
Patient is requesting a refill of diclofenac sodium (VOLTAREN) 1 % GEL Please advise    Pharmacy: [facility] [place], [place]

## 2016-09-14 NOTE — Telephone Encounter (Signed)
Last filled 12/2014. Okay to refill?

## 2016-09-15 MED ORDER — DICLOFENAC SODIUM 1 % TD GEL: 1.0000 "application " | Freq: Every day | TRANSDERMAL | 2 refills | Status: DC | PRN

## 2016-09-19 ENCOUNTER — Telehealth: Payer: Self-pay

## 2016-09-19 NOTE — Telephone Encounter (Signed)
Pre Visit Call Completed. 

## 2016-09-20 ENCOUNTER — Ambulatory Visit (INDEPENDENT_AMBULATORY_CARE_PROVIDER_SITE_OTHER): Payer: Medicare HMO | Admitting: Family

## 2016-09-20 ENCOUNTER — Encounter: Payer: Self-pay | Admitting: Family

## 2016-09-20 DIAGNOSIS — G6289 Other specified polyneuropathies: Secondary | ICD-10-CM

## 2016-09-20 DIAGNOSIS — I10 Essential (primary) hypertension: Secondary | ICD-10-CM

## 2016-09-20 NOTE — Progress Notes (Signed)
Subjective:    Patient ID: [name] Pratt, male    DOB: 06/02/1941, 76 y.o.   MRN: [id number]  HPI  [name] is a 76 yr old male who presents today for follow up.  HTN-  Maintained on hyzaar, amlodipine and Hydralazine tid.  BP Readings from Last 3 Encounters:  09/20/16 (!) 142/67  08/29/16 134/60  08/02/16 (!) 150/90   Peripheral neuropathy- reports improvement in pain since starting gabapentin. Does not bother him during the day.     Review of Systems See HPI  Past Medical History:  Diagnosis Date  . Arthritis   . Cancer ([place])    skin cancer  . Carotid stenosis    a. Carotid U/S [id number]: LICA < [id number], RICA 0000000;  b.  Carotid U/S 0000000:  RICA 123456; LICA [id number]; f/u 1 year  . Chest pain     Myoview in 2008 was normal.  Echo 7/09: EF 60%, normal wall motion, mild LVH, mild LAE.  [name] Complication of anesthesia    "stomach does not wake up"  . COPD (chronic obstructive pulmonary disease) ([place])   . GERD (gastroesophageal reflux disease)   . Gout   . History of chicken pox   . History of hemorrhoids   . History of kidney stones   . Hyperlipidemia    under control  . Hypertension    under control  . Hypothyroidism   . Ileus ([place])   . Inguinal hernia   . OSA (obstructive sleep apnea)   . Pancreatitis   . PONV (postoperative nausea and vomiting)   . Rosacea      Social History   Social History  . Marital status: Married    Spouse name: N/A  . Number of children: 1  . Years of education: N/A   Occupational History  . firefighter     paints on the side   Social History Main Topics  . Smoking status: Former Smoker    Years: 11.00    Types: Cigarettes    Quit date: 09/11/1978  . Smokeless tobacco: Never Used  . Alcohol use 0.0 oz/week     Comment: rare  . Drug use: No  . Sexual activity: Not on file   Other Topics Concern  . Not on file   Social History Narrative   Lives with his wife   He has one son- lives locally.   2 grandchildren   Has  worked for Research officer, trade union   Enjoys wood working   Completed HS.  [employer]       Past Surgical History:  Procedure Laterality Date  . ABDOMINAL HERNIA REPAIR  07/15/12   Dr [name]  . BROW LIFT  05/07/01  . CYSTOSCOPY WITH URETEROSCOPY AND STENT PLACEMENT Right 01/28/2014   Procedure: CYSTOSCOPY WITH URETEROSCOPY, BASKET RETRIVAL AND  STENT PLACEMENT;  Surgeon: [name], MD;  Location: [facility];  Service: Urology;  Laterality: Right;  . epidural injections     multiple procedures  . [place]   multiple times  . EYE SURGERY Bilateral 03/25/10, 2012   cataract removal  . HEMORRHOID SURGERY  2014  . HIATAL HERNIA REPAIR  12/21/93  . HOLMIUM LASER APPLICATION Right [id number]   Procedure: HOLMIUM LASER APPLICATION;  Surgeon: [name], MD;  Location[facility];  Service: Urology;  Laterality: Right;  . INGUINAL HERNIA REPAIR Right 07/15/12   Dr [name], x2  . JOINT REPLACEMENT  07/25/04   right knee  .  JOINT REPLACEMENT  07/13/10   left knee  . KNEE SURGERY  1995  . LAPAROSCOPIC CHOLECYSTECTOMY  09/20/06   with intraoperative cholangiogram and right inguinal herniorrhaphy with mesh   . LIGAMENT REPAIR Left 07/2013   shoulder  . LITHOTRIPSY     x2  . NASAL SEPTUM SURGERY  1975  . REFRACTIVE SURGERY Bilateral   . 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 SKIN CANCER DESTRUCTION     nose, ear  . TONSILLECTOMY  as child  . TOTAL HIP ARTHROPLASTY Left   . URETHRAL DILATION  1991   [name]    Family History  Problem Relation Age of Onset  . Hypertension    . Cancer    . Osteoarthritis    . Cancer Mother     Allergies  Allergen Reactions  . Metoclopramide Hcl     anxiety    Current Outpatient Prescriptions on File Prior to Visit  Medication Sig Dispense Refill  . acetaminophen (TYLENOL) 500 MG tablet Take 500 mg by mouth every 6 (six) hours as needed for moderate pain.    [name] albuterol (PROVENTIL HFA;VENTOLIN HFA) 108 (90 Base) MCG/ACT inhaler  Inhale 2 puffs into the lungs every 6 (six) hours as needed for shortness of breath. 3 Inhaler 1  . allopurinol (ZYLOPRIM) 100 MG tablet Take 1 tablet (100 mg total) by mouth daily. 90 tablet 1  . allopurinol (ZYLOPRIM) 300 MG tablet TAKE 1 TABLET ONE TIME DAILY 90 tablet 1  . amLODipine (NORVASC) 5 MG tablet TAKE 1 TABLET EVERY DAY 90 tablet 1  . aspirin EC 81 MG tablet Take 81 mg by mouth every morning.    . Carboxymethylcellul-Glycerin (OPTIVE) 0.5-0.9 % SOLN Apply 1 drop to eye daily.    . cycloSPORINE (RESTASIS) 0.05 % ophthalmic emulsion Place 1 drop into both eyes 2 (two) times daily.    . diclofenac sodium (VOLTAREN) 1 % GEL Apply 1 application topically daily as needed (arthritis). 100 g 2  . diphenhydrAMINE (BENADRYL) 25 MG tablet Take 25 mg by mouth every 6 (six) hours as needed for allergies.    . fluticasone (FLONASE) 50 MCG/ACT nasal spray Place 2 sprays into both nostrils daily. 48 g 1  . gabapentin (NEURONTIN) 300 MG capsule Take 1 capsule (300 mg total) by mouth at bedtime. 90 capsule 1  . gemfibrozil (LOPID) 600 MG tablet TAKE 1/2 TABLET TWICE DAILY BEFORE MEALS 90 tablet 1  . hydrALAZINE (APRESOLINE) 50 MG tablet Take 1 tablet (50 mg total) by mouth 3 (three) times daily. 270 tablet 1  . IRON PO Take 50 mcg by mouth daily.    [name] levothyroxine (SYNTHROID, LEVOTHROID) 150 MCG tablet TAKE 1 TABLET ONE TIME DAILY 6 DAYS A WEEK AND 1/2 TABLET 1 DAY A WEEK 78 tablet 1  . losartan-hydrochlorothiazide (HYZAAR) 100-25 MG tablet Take 1 tablet by mouth daily. 90 tablet 3  . multivitamin (THERAGRAN) per tablet Take 1 tablet by mouth daily.      . Omega-3 Fatty Acids (FISH OIL PO) Take 1 capsule by mouth 2 (two) times daily.    [name] omeprazole (PRILOSEC) 20 MG capsule Take 1 capsule (20 mg total) by mouth every morning. 90 capsule 1  . pravastatin (PRAVACHOL) 40 MG tablet TAKE 1 TABLET EVERY DAY 90 tablet 1  . sodium chloride (OCEAN) 0.65 % nasal spray Place 1 spray into both nostrils at  bedtime as needed for congestion.     Current Facility-Administered Medications on File Prior to Visit  Medication Dose Route Frequency Provider Last Rate Last Dose  . cloNIDine (CATAPRES) tablet 0.1 mg  0.1 mg Oral Once [name], NP        BP (!) 142/67 (BP Location: Right Arm, Cuff Size: Normal)   Pulse 66   Temp 97.8 F (36.6 C) (Oral)   Resp 16   Ht 5\' 7"  (1.702 m)   Wt 183 lb 6.4 oz (83.2 kg)   SpO2 98% Comment: room air  BMI 28.72 kg/m       Objective:   Physical Exam  Constitutional: He is oriented to person, place, and time. He appears well-developed and well-nourished. No distress.  HENT:  Head: Normocephalic and atraumatic.  Cardiovascular: Normal rate and regular rhythm.   No murmur heard. Pulmonary/Chest: Effort normal and breath sounds normal. No respiratory distress. He has no wheezes. He has no rales.  Musculoskeletal: He exhibits no edema.  Neurological: He is alert and oriented to person, place, and time.  Skin: Skin is warm and dry.  Psychiatric: He has a normal mood and affect. His behavior is normal. Thought content normal.          Assessment & Plan:

## 2016-09-20 NOTE — Progress Notes (Signed)
Pre visit review using our clinic review tool, if applicable. No additional management support is needed unless otherwise documented below in the visit note. 

## 2016-09-21 MED ORDER — LEVOTHYROXINE SODIUM 150 MCG PO TABS: ORAL_TABLET | ORAL | 1 refills | Status: DC

## 2016-09-21 NOTE — Assessment & Plan Note (Signed)
Improved with qhs gabapentin, continue same.

## 2016-09-21 NOTE — Assessment & Plan Note (Signed)
>>  ASSESSMENT AND PLAN FOR BENIGN ESSENTIAL HYPERTENSION WRITTEN ON 09/21/2016  1:48 PM BY O'SULLIVAN, Aleene Swanner, NP  At goal for age, continue current medications.

## 2016-09-21 NOTE — Assessment & Plan Note (Signed)
At goal for age, continue current medications

## 2016-09-26 ENCOUNTER — Encounter: Payer: Self-pay | Admitting: Neurology

## 2016-09-28 ENCOUNTER — Institutional Professional Consult (permissible substitution): Payer: Commercial Managed Care - HMO | Admitting: Pulmonary Disease

## 2016-09-28 DIAGNOSIS — G4733 Obstructive sleep apnea (adult) (pediatric): Secondary | ICD-10-CM | POA: Diagnosis not present

## 2016-09-29 ENCOUNTER — Telehealth: Payer: Self-pay | Admitting: Family

## 2016-09-29 MED ORDER — ALLOPURINOL 300 MG PO TABS: ORAL_TABLET | ORAL | 1 refills | Status: DC

## 2016-09-29 MED ORDER — ALLOPURINOL 100 MG PO TABS: 100.0000 mg | ORAL_TABLET | Freq: Every day | ORAL | 1 refills | Status: DC

## 2016-09-29 NOTE — Telephone Encounter (Signed)
Self. Refill for allopurinol (ZYLOPRIM) 100 MG   [facility] Mail order

## 2016-09-29 NOTE — Telephone Encounter (Signed)
Refills sent. Notified pt. 

## 2016-10-03 ENCOUNTER — Telehealth: Payer: Self-pay | Admitting: Neurology

## 2016-10-03 DIAGNOSIS — Z9989 Dependence on other enabling machines and devices: Principal | ICD-10-CM

## 2016-10-03 DIAGNOSIS — G4733 Obstructive sleep apnea (adult) (pediatric): Secondary | ICD-10-CM

## 2016-10-03 NOTE — Telephone Encounter (Signed)
Patient calling to discuss going back on old CPAP machine. He used new machine for 2 weeks and returned it. Does he need to be on new machine or old machine?

## 2016-10-04 NOTE — Telephone Encounter (Signed)
Received a verbal order to start pt on cpap at 12 cm H2O.

## 2016-10-05 NOTE — Addendum Note (Signed)
Addended by: Lester Little [place] A on: 10/05/2016 09:55 AM   Modules accepted: Orders

## 2016-10-05 NOTE — Telephone Encounter (Signed)
I called pt again. No answer, left a message asking him to call me back. 

## 2016-10-05 NOTE — Telephone Encounter (Signed)
I called pt to discuss. No answer, left a message asking pt to call me back. 

## 2016-10-05 NOTE — Telephone Encounter (Signed)
Patient returning nurses call

## 2016-10-05 NOTE — Telephone Encounter (Signed)
I called pt back. Spoke to pt's wife, [name] Pratt, per [place]. I advised her that Dr. Brett Fairy does want the pt to continue using his cpap machine and that she is going to increase his pressure to 12 cm H2O. This will be [facility] to [facility], and they will reach out to the pt. Pt's wife verbalized understanding and she will relay this information to the pt. I asked her to have the pt call me back if he has any questions.

## 2016-10-05 NOTE — Telephone Encounter (Signed)
Pt called me back. Pt claims that he was already set at 12 cm H2O but Dr. Brett Fairy was concerned about the number of events that he had while set at 12 cm H2O. (Per Dr. Edwena Felty note, pt's cpap was at 11 cm H2O with 3 cm EPR, pt cannot tolerate 13 cm H20).  Pt wishes to discuss this with Dr. Brett Fairy, including the results of the auto titration, and why she wants to set him at 12 cm H2O when his events were high at 11 cm H2O.  Pt can be reached at [phone].

## 2016-10-06 NOTE — Telephone Encounter (Signed)
Spoke to [name] Pratt, who would like a new machine with auto capacity IF [employer] covers the costs. His machine is 77 years old !  He doesn't want new parts for his old machine,and rather have the new machie set to his needs. He wanted to know what it will cost him- a question only [employer] can answer. He will call us back. CD

## 2016-10-10 DIAGNOSIS — G4733 Obstructive sleep apnea (adult) (pediatric): Secondary | ICD-10-CM | POA: Diagnosis not present

## 2016-10-13 ENCOUNTER — Other Ambulatory Visit: Payer: Self-pay | Admitting: Cardiovascular Disease

## 2016-10-13 ENCOUNTER — Ambulatory Visit (INDEPENDENT_AMBULATORY_CARE_PROVIDER_SITE_OTHER): Payer: Medicare HMO | Admitting: Pulmonary Disease

## 2016-10-13 ENCOUNTER — Encounter: Payer: Self-pay | Admitting: Pulmonary Disease

## 2016-10-13 VITALS — BP 138/72 | HR 69 | Ht 67.0 in | Wt 177.0 lb

## 2016-10-13 DIAGNOSIS — R053 Chronic cough: Secondary | ICD-10-CM

## 2016-10-13 DIAGNOSIS — R05 Cough: Secondary | ICD-10-CM

## 2016-10-13 LAB — NITRIC OXIDE: NITRIC OXIDE: 9

## 2016-10-13 MED ORDER — BUDESONIDE-FORMOTEROL FUMARATE 80-4.5 MCG/ACT IN AERO: 2.0000 | INHALATION_SPRAY | Freq: Two times a day (BID) | RESPIRATORY_TRACT | 0 refills | Status: DC

## 2016-10-13 NOTE — Patient Instructions (Signed)
We will schedule you for pulmonary function tests We will give you samples of Symbicort 80/4.5  Return to clinic in 1-2 months

## 2016-10-13 NOTE — Progress Notes (Signed)
[name] Pratt    [id number]    1941/07/15  Primary Care Physician:O'SULLIVAN,MELISSA S., NP  Referring Physician: [name], NP [address] [place], [place] [address]  Chief complaint:  Consult for evaluation of dyspnea  HPI: Mr. Pfautz is a 76 year old with past medical history of hypertension, hyperlipidemia, OSA. He has complains of progressive dyspnea on exertion over the past few months. He denies any dyspnea at rest, cough, sputum production or wheezing. He does not have any chest pain, palpitation.  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 He had been exposed to fumes and smoke in his line of work. He has a 10-pack-year smoking history.  Outpatient Encounter Prescriptions as of 10/13/2016  Medication Sig  . acetaminophen (TYLENOL) 500 MG tablet Take 500 mg by mouth every 6 (six) hours as needed for moderate pain.  [name] albuterol (PROVENTIL HFA;VENTOLIN HFA) 108 (90 Base) MCG/ACT inhaler Inhale 2 puffs into the lungs every 6 (six) hours as needed for shortness of breath.  . allopurinol (ZYLOPRIM) 100 MG tablet Take 1 tablet (100 mg total) by mouth daily.  [name] allopurinol (ZYLOPRIM) 300 MG tablet TAKE 1 TABLET ONE TIME DAILY  . amLODipine (NORVASC) 5 MG tablet TAKE 1 TABLET EVERY DAY  . aspirin EC 81 MG tablet Take 81 mg by mouth every morning.  . Carboxymethylcellul-Glycerin (OPTIVE) 0.5-0.9 % SOLN Apply 1 drop to eye daily.  . cycloSPORINE (RESTASIS) 0.05 % ophthalmic emulsion Place 1 drop into both eyes 2 (two) times daily.  . diclofenac sodium (VOLTAREN) 1 % GEL Apply 1 application topically daily as needed (arthritis).  [name] diphenhydrAMINE (BENADRYL) 25 MG tablet Take 25 mg by mouth every 6 (six) hours as needed for allergies.  . fluticasone (FLONASE) 50 MCG/ACT nasal spray Place 2 sprays into both nostrils daily.  [name] gabapentin (NEURONTIN) 300 MG capsule Take 1 capsule (300 mg total) by mouth at bedtime.  [name] gemfibrozil (LOPID) 600 MG  tablet TAKE 1/2 TABLET TWICE DAILY BEFORE MEALS  . hydrALAZINE (APRESOLINE) 50 MG tablet Take 1 tablet (50 mg total) by mouth 3 (three) times daily.  . IRON PO Take 50 mcg by mouth daily.  [name] levothyroxine (SYNTHROID, LEVOTHROID) 150 MCG tablet TAKE 1 TABLET ONE TIME DAILY 6 DAYS A WEEK AND 1/2 TABLET 1 DAY A WEEK  . losartan-hydrochlorothiazide (HYZAAR) 100-25 MG tablet Take 1 tablet by mouth daily.  . multivitamin (THERAGRAN) per tablet Take 1 tablet by mouth daily.    . Omega-3 Fatty Acids (FISH OIL PO) Take 1 capsule by mouth 2 (two) times daily.  [name] omeprazole (PRILOSEC) 20 MG capsule Take 1 capsule (20 mg total) by mouth every morning.  . pravastatin (PRAVACHOL) 40 MG tablet TAKE 1 TABLET EVERY DAY  . sodium chloride (OCEAN) 0.65 % nasal spray Place 1 spray into both nostrils at bedtime as needed for congestion.   Facility-Administered Encounter Medications as of 10/13/2016  Medication  . cloNIDine (CATAPRES) tablet 0.1 mg    Allergies as of 10/13/2016 - Review Complete 10/13/2016  Allergen Reaction Noted  . Metoclopramide hcl  06/12/2011    Past Medical History:  Diagnosis Date  . Arthritis   . Cancer ([place])    skin cancer  . Carotid stenosis    a. Carotid U/S [id number]: LICA < [id number], RICA 0000000;  b.  Carotid U/S 0000000:  RICA 123456; LICA XX123456; f/u 1 year  . Chest pain  Myoview in 2008 was normal.  Echo 7/09: EF 60%, normal wall motion, mild LVH, mild LAE.  [name] Complication of anesthesia    "stomach does not wake up"  . COPD (chronic obstructive pulmonary disease) ([place])   . GERD (gastroesophageal reflux disease)   . Gout   . History of chicken pox   . History of hemorrhoids   . History of kidney stones   . Hyperlipidemia    under control  . Hypertension    under control  . Hypothyroidism   . Ileus ([place])   . Inguinal hernia   . OSA (obstructive sleep apnea)   . Pancreatitis   . PONV (postoperative nausea and vomiting)   . Rosacea     Past Surgical History:  Procedure  Laterality Date  . ABDOMINAL HERNIA REPAIR  07/15/12   Dr [name]  . BROW LIFT  05/07/01  . CYSTOSCOPY WITH URETEROSCOPY AND STENT PLACEMENT Right 01/28/2014   Procedure: CYSTOSCOPY WITH URETEROSCOPY, BASKET RETRIVAL AND  STENT PLACEMENT;  Surgeon: [name], MD;  Location: [facility];  Service: Urology;  Laterality: Right;  . epidural injections     multiple procedures  . [place]   multiple times  . EYE SURGERY Bilateral 03/25/10, 2012   cataract removal  . HEMORRHOID SURGERY  2014  . HIATAL HERNIA REPAIR  12/21/93  . HOLMIUM LASER APPLICATION Right [id number]   Procedure: HOLMIUM LASER APPLICATION;  Surgeon: [name], MD;  Location: [facility];  Service: Urology;  Laterality: Right;  . INGUINAL HERNIA REPAIR Right 07/15/12   Dr [name], x2  . JOINT REPLACEMENT  07/25/04   right knee  . JOINT REPLACEMENT  07/13/10   left knee  . KNEE SURGERY  1995  . LAPAROSCOPIC CHOLECYSTECTOMY  09/20/06   with intraoperative cholangiogram and right inguinal herniorrhaphy with mesh   . LIGAMENT REPAIR Left 07/2013   shoulder  . LITHOTRIPSY     x2  . NASAL SEPTUM SURGERY  1975  . REFRACTIVE SURGERY Bilateral   . 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 SKIN CANCER DESTRUCTION     nose, ear  . TONSILLECTOMY  as child  . TOTAL HIP ARTHROPLASTY Left   . URETHRAL DILATION  1991   [name]    Family History  Problem Relation Age of Onset  . Hypertension    . Cancer    . Osteoarthritis    . Cancer Mother     Social History   Social History  . Marital status: Married    Spouse name: N/A  . Number of children: 1  . Years of education: N/A   Occupational History  . firefighter     paints on the side   Social History Main Topics  . Smoking status: Former Smoker    Years: 11.00    Types: Cigarettes    Quit date: 09/11/1978  . Smokeless tobacco: Never Used  . Alcohol use 0.0 oz/week     Comment: rare  . Drug use: No  . Sexual activity: Not on file   Other  Topics Concern  . Not on file   Social History Narrative   Lives with his wife   He has one son- lives locally.   2 grandchildren   Has worked for Research officer, trade union   Enjoys wood working   Completed HS.  [employer]      Review of systems: Review of Systems  Constitutional: Negative for fever and chills.  HENT: Negative.   Eyes: Negative for blurred vision.  Respiratory: as per HPI  Cardiovascular: Negative for chest pain and palpitations.  Gastrointestinal: Negative for vomiting, diarrhea, blood per rectum. Genitourinary: Negative for dysuria, urgency, frequency and hematuria.  Musculoskeletal: Negative for myalgias, back pain and joint pain.  Skin: Negative for itching and rash.  Neurological: Negative for dizziness, tremors, focal weakness, seizures and loss of consciousness.  Endo/Heme/Allergies: Negative for environmental allergies.  Psychiatric/Behavioral: Negative for depression, suicidal ideas and hallucinations.  All other systems reviewed and are negative.  Physical Exam: Blood pressure 138/72, pulse 69, height 5\' 7"  (1.702 m), weight 177 lb (80.3 kg), SpO2 95 %. Gen:      No acute distress HEENT:  EOMI, sclera anicteric Neck:     No masses; no thyromegaly Lungs:    Clear to auscultation bilaterally; normal respiratory effort CV:         Regular rate and rhythm; no murmurs Abd:      + bowel sounds; soft, non-tender; no palpable masses, no distension Ext:    No edema; adequate peripheral perfusion Skin:      Warm and dry; no rash Neuro: alert and oriented x 3 Psych: normal mood and affect  Data Reviewed: [facility] 10/13/16- 9  CT chest 05/22/16-no PE, mild dependent atelectasis Chest x-ray 05/19/16-hyperinflation CT abdomen 01/17/14-lung images shows subsegmental atelectasis  Assessment:  Consult for evaluation of dyspnea Mr. Eyer has progressive dyspnea. He may have COPD based on his remote smoking history and exposure to fumes and smoke as a Airline pilot. I will  give him samples of Symbicort inhaler to try. He gets pulmonary function tests for better assessment of his lung function.  Plan/Recommendations: - Schedule for PFTs - Samples of symbicort.  Marshell Garfinkel MD [facility] Pager [phone] 10/13/2016, 4:37 PM  CC: [name], NP

## 2016-10-16 ENCOUNTER — Telehealth: Payer: Self-pay | Admitting: Family

## 2016-10-16 NOTE — Telephone Encounter (Signed)
Patient left message to about billing concern. He was charged a Engineer, building services due to billing provider. I have resubmitted to correction and notified the patient

## 2016-10-18 ENCOUNTER — Encounter: Payer: Self-pay | Admitting: Family

## 2016-10-18 DIAGNOSIS — H35351 Cystoid macular degeneration, right eye: Secondary | ICD-10-CM | POA: Diagnosis not present

## 2016-10-18 DIAGNOSIS — H35352 Cystoid macular degeneration, left eye: Secondary | ICD-10-CM | POA: Diagnosis not present

## 2016-10-18 DIAGNOSIS — G4731 Primary central sleep apnea: Secondary | ICD-10-CM | POA: Diagnosis not present

## 2016-10-18 DIAGNOSIS — H35071 Retinal telangiectasis, right eye: Secondary | ICD-10-CM | POA: Diagnosis not present

## 2016-10-18 DIAGNOSIS — H35073 Retinal telangiectasis, bilateral: Secondary | ICD-10-CM | POA: Diagnosis not present

## 2016-10-18 DIAGNOSIS — H35072 Retinal telangiectasis, left eye: Secondary | ICD-10-CM | POA: Diagnosis not present

## 2016-10-18 DIAGNOSIS — H35353 Cystoid macular degeneration, bilateral: Secondary | ICD-10-CM | POA: Diagnosis not present

## 2016-10-31 NOTE — Telephone Encounter (Signed)
Left patient a message to call back. Sent claim to charge corrections

## 2016-11-13 ENCOUNTER — Telehealth: Payer: Self-pay | Admitting: Pulmonary Disease

## 2016-11-13 DIAGNOSIS — R05 Cough: Secondary | ICD-10-CM

## 2016-11-13 DIAGNOSIS — R053 Chronic cough: Secondary | ICD-10-CM

## 2016-11-13 MED ORDER — BUDESONIDE-FORMOTEROL FUMARATE 80-4.5 MCG/ACT IN AERO: 2.0000 | INHALATION_SPRAY | Freq: Two times a day (BID) | RESPIRATORY_TRACT | 0 refills | Status: DC

## 2016-11-13 NOTE — Telephone Encounter (Signed)
2 samples of Symbicort 80 up front for pick up  Pt aware and nothing further needed

## 2016-11-15 ENCOUNTER — Telehealth: Payer: Self-pay | Admitting: Family

## 2016-11-15 MED ORDER — OMEPRAZOLE 20 MG PO CPDR
20.0000 mg | DELAYED_RELEASE_CAPSULE | Freq: Every morning | ORAL | 1 refills | Status: DC
Start: 2016-11-15 — End: 2017-05-16

## 2016-11-15 NOTE — Telephone Encounter (Signed)
Relation to AG:TXMI Call back [place]: [place], [place] [phone] (Phone) [phone] (Fax)     Reason for call:  Patient requesting  90 day supply of omeprazole (PRILOSEC) 20 MG capsule

## 2016-11-15 NOTE — Telephone Encounter (Signed)
Refill sent, notified pt. 

## 2016-11-30 ENCOUNTER — Ambulatory Visit (INDEPENDENT_AMBULATORY_CARE_PROVIDER_SITE_OTHER): Payer: Medicare HMO | Admitting: Medical

## 2016-11-30 ENCOUNTER — Telehealth: Payer: Self-pay | Admitting: Medical

## 2016-11-30 ENCOUNTER — Encounter: Payer: Self-pay | Admitting: Medical

## 2016-11-30 VITALS — BP 144/67 | HR 73 | Temp 98.0°F | Ht 67.0 in | Wt 177.6 lb

## 2016-11-30 DIAGNOSIS — R197 Diarrhea, unspecified: Secondary | ICD-10-CM

## 2016-11-30 DIAGNOSIS — R112 Nausea with vomiting, unspecified: Secondary | ICD-10-CM

## 2016-11-30 DIAGNOSIS — R69 Illness, unspecified: Secondary | ICD-10-CM | POA: Diagnosis not present

## 2016-11-30 DIAGNOSIS — R109 Unspecified abdominal pain: Secondary | ICD-10-CM

## 2016-11-30 LAB — COMPREHENSIVE METABOLIC PANEL
ALBUMIN: 4.5 g/dL (ref 3.5–5.2)
ALK PHOS: 59 U/L (ref 39–117)
ALT: 24 U/L (ref 0–53)
AST: 26 U/L (ref 0–37)
BILIRUBIN TOTAL: 0.4 mg/dL (ref 0.2–1.2)
BUN: 39 mg/dL — AB (ref 6–23)
CHLORIDE: 101 meq/L (ref 96–112)
CO2: 26 meq/L (ref 19–32)
CREATININE: 1.46 mg/dL (ref 0.40–1.50)
Calcium: 9.6 mg/dL (ref 8.4–10.5)
GFR: 49.79 mL/min — ABNORMAL LOW (ref >60.00)
Glucose, Bld: 132 mg/dL — ABNORMAL HIGH (ref 70–99)
Potassium: 3.3 mEq/L — ABNORMAL LOW (ref 3.5–5.1)
SODIUM: 137 meq/L (ref 135–145)
Total Protein: 8 g/dL (ref 6.0–8.3)

## 2016-11-30 LAB — CBC WITH DIFFERENTIAL/PLATELET
BASOS ABS: 0.1 10*3/uL (ref 0.0–0.1)
BASOS PCT: 0.5 % (ref 0.0–3.0)
EOS ABS: 0 10*3/uL (ref 0.0–0.7)
Eosinophils Relative: 0.1 % (ref 0.0–5.0)
HCT: 41.1 % (ref 39.0–52.0)
HEMOGLOBIN: 13.6 g/dL (ref 13.0–17.0)
LYMPHS PCT: 5.5 % — AB (ref 12.0–46.0)
Lymphs Abs: 0.7 10*3/uL (ref 0.7–4.0)
MCHC: 33 g/dL (ref 30.0–36.0)
MCV: 97.2 fl (ref 78.0–100.0)
MONO ABS: 0.7 10*3/uL (ref 0.1–1.0)
Monocytes Relative: 5.8 % (ref 3.0–12.0)
Neutro Abs: 10.6 10*3/uL — ABNORMAL HIGH (ref 1.4–7.7)
Neutrophils Relative %: 88.1 % — ABNORMAL HIGH (ref 43.0–77.0)
Platelets: 365 10*3/uL (ref 150.0–400.0)
RBC: 4.23 Mil/uL (ref 4.22–5.81)
RDW: 14.5 % (ref 11.5–15.5)
WBC: 12.1 10*3/uL — AB (ref 4.0–10.5)

## 2016-11-30 LAB — AMYLASE: AMYLASE: 27 U/L (ref 27–131)

## 2016-11-30 LAB — LIPASE: Lipase: 3 U/L — ABNORMAL LOW (ref 11.0–59.0)

## 2016-11-30 MED ORDER — POTASSIUM CHLORIDE ER 10 MEQ PO TBCR: EXTENDED_RELEASE_TABLET | ORAL | 0 refills | Status: DC

## 2016-11-30 MED ORDER — ONDANSETRON 8 MG PO TBDP: 8.0000 mg | ORAL_TABLET | Freq: Three times a day (TID) | ORAL | 0 refills | Status: DC | PRN

## 2016-11-30 NOTE — Patient Instructions (Addendum)
Your diarrhea cause is likely viral. I do think it is good idea to get labs as we approach weekend. Will order cbc, cmp, amylase and lipase.  For nausea rx zofran.  Recommend rest, hydrate with propel fitness water and bland food. For diarrhea use immodium AD otc.  If symptoms worsen or change let us know.  Stool panel order placed. Pick up kit. If your diarrhea worens by tomorrow then advise turn kit in as this would expidite the results.  Follow up in 7 days or as needed

## 2016-11-30 NOTE — Telephone Encounter (Signed)
rx kdur sent to the pharmacy.

## 2016-11-30 NOTE — Progress Notes (Signed)
Subjective:    Patient ID: [name] Pratt, male    DOB: 10-16-1940, 76 y.o.   MRN: [id number]  HPI  Pt in states yesterday at 4 pm he started feeling some nausea and light headed. He states he had some dry heaves since yesterday. He ate at [employer] last night. He ate chicken pot pie. He vomited small portion of that this am. Pt had 6-7 loose stools. First stools water but recent one more formed. Pt states no recent illness. Pt wife not sick with GI illness. Pt reports no chronic bowel illness. No ibs, no inflammatory bowel disease, and no diverticulitis history. Mild abdomen pain associated with above illness.    Review of Systems  Constitutional: Negative for chills, fatigue and fever.  Respiratory: Negative for cough, chest tightness, shortness of breath and wheezing.   Cardiovascular: Negative for chest pain and palpitations.  Gastrointestinal: Positive for abdominal pain, diarrhea and nausea. Negative for blood in stool and vomiting.       Vomited one time.  Faint abd discomfort associated with loose stools.  Genitourinary: Negative for dysuria and flank pain.  Musculoskeletal: Negative for back pain, myalgias and neck stiffness.  Skin: Negative for rash.  Neurological: Negative for dizziness, tremors, speech difficulty, weakness, numbness and headaches.       Light headed when get nausea  Hematological: Negative for adenopathy. Does not bruise/bleed easily.  Psychiatric/Behavioral: Negative for behavioral problems and confusion.    Past Medical History:  Diagnosis Date  . Arthritis   . Cancer ([place])    skin cancer  . Carotid stenosis    a. Carotid U/S 1/69: LICA < 67%, RICA 89-38%;  b.  Carotid U/S 1/01:  RICA 75-10%; LICA 2-58%; f/u 1 year  . Chest pain     Myoview in 2008 was normal.  Echo 7/09: EF 60%, normal wall motion, mild LVH, mild LAE.  [name] Complication of anesthesia    "stomach does not wake up"  . COPD (chronic obstructive pulmonary disease) ([place])   . GERD  (gastroesophageal reflux disease)   . Gout   . History of chicken pox   . History of hemorrhoids   . History of kidney stones   . Hyperlipidemia    under control  . Hypertension    under control  . Hypothyroidism   . Ileus ([place])   . Inguinal hernia   . OSA (obstructive sleep apnea)   . Pancreatitis   . PONV (postoperative nausea and vomiting)   . Rosacea      Social History   Social History  . Marital status: Married    Spouse name: N/A  . Number of children: 1  . Years of education: N/A   Occupational History  . firefighter     paints on the side   Social History Main Topics  . Smoking status: Former Smoker    Years: 11.00    Types: Cigarettes    Quit date: 09/11/1978  . Smokeless tobacco: Never Used  . Alcohol use 0.0 oz/week     Comment: rare  . Drug use: No  . Sexual activity: Not on file   Other Topics Concern  . Not on file   Social History Narrative   Lives with his wife   He has one son- lives locally.   2 grandchildren   Has worked for Research [employer], trade union   Enjoys wood working   Completed HS.  [employer]       Past Surgical History:  Procedure Laterality Date  . ABDOMINAL HERNIA REPAIR  07/15/12   Dr [name]  . BROW LIFT  05/07/01  . CYSTOSCOPY WITH URETEROSCOPY AND STENT PLACEMENT Right 01/28/2014   Procedure: CYSTOSCOPY WITH URETEROSCOPY, BASKET RETRIVAL AND  STENT PLACEMENT;  Surgeon: [name], MD;  Location: [facility];  Service: Urology;  Laterality: Right;  . epidural injections     multiple procedures  . [place]   multiple times  . EYE SURGERY Bilateral 03/25/10, 2012   cataract removal  . HEMORRHOID SURGERY  2014  . HIATAL HERNIA REPAIR  12/21/93  . HOLMIUM LASER APPLICATION Right 1/61/0960   Procedure: HOLMIUM LASER APPLICATION;  Surgeon: [name], MD;  Location: [facility];  Service: Urology;  Laterality: Right;  . INGUINAL HERNIA REPAIR Right 07/15/12   Dr [name], x2  . JOINT REPLACEMENT  07/25/04   right  knee  . JOINT REPLACEMENT  07/13/10   left knee  . KNEE SURGERY  1995  . LAPAROSCOPIC CHOLECYSTECTOMY  09/20/06   with intraoperative cholangiogram and right inguinal herniorrhaphy with mesh   . LIGAMENT REPAIR Left 07/2013   shoulder  . LITHOTRIPSY     x2  . NASAL SEPTUM SURGERY  1975  . REFRACTIVE SURGERY Bilateral   . 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 SKIN CANCER DESTRUCTION     nose, ear  . TONSILLECTOMY  as child  . TOTAL HIP ARTHROPLASTY Left   . URETHRAL DILATION  1991   [name]    Family History  Problem Relation Age of Onset  . Hypertension    . Cancer    . Osteoarthritis    . Cancer Mother     Allergies  Allergen Reactions  . Metoclopramide Hcl     anxiety    Current Outpatient Prescriptions on File Prior to Visit  Medication Sig Dispense Refill  . acetaminophen (TYLENOL) 500 MG tablet Take 500 mg by mouth every 6 (six) hours as needed for moderate pain.    [name] albuterol (PROVENTIL HFA;VENTOLIN HFA) 108 (90 Base) MCG/ACT inhaler Inhale 2 puffs into the lungs every 6 (six) hours as needed for shortness of breath. 3 Inhaler 1  . allopurinol (ZYLOPRIM) 100 MG tablet Take 1 tablet (100 mg total) by mouth daily. 90 tablet 1  . allopurinol (ZYLOPRIM) 300 MG tablet TAKE 1 TABLET ONE TIME DAILY 90 tablet 1  . amLODipine (NORVASC) 5 MG tablet TAKE 1 TABLET EVERY DAY 90 tablet 1  . aspirin EC 81 MG tablet Take 81 mg by mouth every morning.    . budesonide-formoterol (SYMBICORT) 80-4.5 MCG/ACT inhaler Inhale 2 puffs into the lungs 2 (two) times daily. 2 Inhaler 0  . Carboxymethylcellul-Glycerin (OPTIVE) 0.5-0.9 % SOLN Apply 1 drop to eye daily.    . cycloSPORINE (RESTASIS) 0.05 % ophthalmic emulsion Place 1 drop into both eyes 2 (two) times daily.    . diclofenac sodium (VOLTAREN) 1 % GEL Apply 1 application topically daily as needed (arthritis). 100 g 2  . diphenhydrAMINE (BENADRYL) 25 MG tablet Take 25 mg by mouth every 6 (six) hours as needed for allergies.    .  fluticasone (FLONASE) 50 MCG/ACT nasal spray Place 2 sprays into both nostrils daily. 48 g 1  . gabapentin (NEURONTIN) 300 MG capsule Take 1 capsule (300 mg total) by mouth at bedtime. 90 capsule 1  . gemfibrozil (LOPID) 600 MG tablet TAKE 1/2 TABLET TWICE DAILY BEFORE MEALS 90 tablet 1  . hydrALAZINE (APRESOLINE)  50 MG tablet Take 1 tablet (50 mg total) by mouth 3 (three) times daily. 270 tablet 1  . IRON PO Take 50 mcg by mouth daily.    [name] levothyroxine (SYNTHROID, LEVOTHROID) 150 MCG tablet TAKE 1 TABLET ONE TIME DAILY 6 DAYS A WEEK AND 1/2 TABLET 1 DAY A WEEK 78 tablet 1  . losartan-hydrochlorothiazide (HYZAAR) 100-25 MG tablet Take 1 tablet by mouth daily. 90 tablet 2  . multivitamin (THERAGRAN) per tablet Take 1 tablet by mouth daily.      . Omega-3 Fatty Acids (FISH OIL PO) Take 1 capsule by mouth 2 (two) times daily.    [name] omeprazole (PRILOSEC) 20 MG capsule Take 1 capsule (20 mg total) by mouth every morning. 90 capsule 1  . pravastatin (PRAVACHOL) 40 MG tablet TAKE 1 TABLET EVERY DAY 90 tablet 1  . sodium chloride (OCEAN) 0.65 % nasal spray Place 1 spray into both nostrils at bedtime as needed for congestion.     Current Facility-Administered Medications on File Prior to Visit  Medication Dose Route Frequency Provider Last Rate Last Dose  . cloNIDine (CATAPRES) tablet 0.1 mg  0.1 mg Oral Once [name], NP        BP (!) 144/67   Pulse 73   Temp 98 F (36.7 C) (Oral)   Ht '5\' 7"'  (1.702 m)   Wt 177 lb 9.6 oz (80.6 kg)   SpO2 97%   BMI 27.82 kg/m       Objective:   Physical Exam  General Appearance- Not in acute distress.  HEENT Eyes- Scleraeral/Conjuntiva-bilat- Not Yellow. Mouth & Throat- Normal.  Chest and Lung Exam Auscultation: Breath sounds:-Normal. Adventitious sounds:- No Adventitious sounds.  Cardiovascular Auscultation:Rythm - Regular. Heart Sounds -Normal heart sounds.  Abdomen Inspection:-Inspection Normal.  Palpation/Perucssion: Palpation  and Percussion of the abdomen reveal- Non Tender, No Rebound tenderness, No rigidity(Guarding) and No Palpable abdominal masses.  Liver:-Normal.  Spleen:- Normal.   Back- no cva tenderness.  Skin- moist.      Assessment & Plan:  Your diarrhea cause is likely viral. I do think it is good idea to get labs as we approach weekend. Will order cbc, cmp, amylase and lipase.  For nausea rx zofran.  Recommend rest, hydrate with propel and bland food. For diarrhea use immodium AD otc.  If symptoms worsen or change let us know.  Stool panel order placed. Pick up kit. If your diarrhea worens by tomorrow then advise turn kit in as this would expidite the results.  Follow up in 7 days or as needed

## 2016-11-30 NOTE — Progress Notes (Signed)
Pre visit review using our clinic review tool, if applicable. No additional management support is needed unless otherwise documented below in the visit note. 

## 2016-12-01 ENCOUNTER — Encounter: Payer: Self-pay | Admitting: Pulmonary Disease

## 2016-12-01 ENCOUNTER — Ambulatory Visit (INDEPENDENT_AMBULATORY_CARE_PROVIDER_SITE_OTHER): Payer: Medicare HMO | Admitting: Pulmonary Disease

## 2016-12-01 VITALS — BP 130/64 | HR 65 | Ht 66.0 in | Wt 174.0 lb

## 2016-12-01 DIAGNOSIS — R0600 Dyspnea, unspecified: Secondary | ICD-10-CM

## 2016-12-01 DIAGNOSIS — R053 Chronic cough: Secondary | ICD-10-CM

## 2016-12-01 DIAGNOSIS — R05 Cough: Secondary | ICD-10-CM | POA: Diagnosis not present

## 2016-12-01 LAB — PULMONARY FUNCTION TEST
DL/VA % pred: 96 %
DL/VA: 4.18 ml/min/mmHg/L
DLCO COR % PRED: 93 %
DLCO cor: 25.31 ml/min/mmHg
DLCO unc % pred: 93 %
DLCO unc: 25.09 ml/min/mmHg
FEF 25-75 POST: 2.43 L/s
FEF 25-75 Pre: 1.87 L/sec
FEF2575-%Change-Post: 29 %
FEF2575-%Pred-Post: 137 %
FEF2575-%Pred-Pre: 105 %
FEV1-%Change-Post: 5 %
FEV1-%Pred-Post: 113 %
FEV1-%Pred-Pre: 106 %
FEV1-POST: 2.83 L
FEV1-Pre: 2.67 L
FEV1FVC-%Change-Post: 0 %
FEV1FVC-%Pred-Pre: 102 %
FEV6-%Change-Post: 6 %
FEV6-%PRED-PRE: 110 %
FEV6-%Pred-Post: 117 %
FEV6-POST: 3.83 L
FEV6-PRE: 3.59 L
FEV6FVC-%Change-Post: 0 %
FEV6FVC-%PRED-POST: 107 %
FEV6FVC-%PRED-PRE: 106 %
FVC-%CHANGE-POST: 6 %
FVC-%PRED-POST: 109 %
FVC-%PRED-PRE: 103 %
FVC-POST: 3.84 L
FVC-PRE: 3.62 L
Post FEV1/FVC ratio: 74 %
Post FEV6/FVC ratio: 100 %
Pre FEV1/FVC ratio: 74 %
Pre FEV6/FVC Ratio: 99 %
RV % PRED: 115 %
RV: 2.73 L
TLC % PRED: 109 %
TLC: 6.85 L

## 2016-12-01 NOTE — Patient Instructions (Signed)
Stop taking the Symbicort as it is not helping with his symptoms Continue the albuterol rescue inhaler  Return to clinic as needed. Please call us if there is any worsening of your shortness of breath.

## 2016-12-01 NOTE — Progress Notes (Signed)
PFT done today. 

## 2016-12-01 NOTE — Progress Notes (Signed)
[name] Pratt    [id number]    [birth year]  Primary Care Physician:O'SULLIVAN,MELISSA S., NP  Referring Physician: [name], NP [address] [place], [place] [address]  Chief complaint:  Follow up for evaluation of dyspnea  HPI: [name] Pratt is a 76 year old with past medical history of hypertension, hyperlipidemia, OSA. He has complains of progressive dyspnea on exertion over the past few months. He denies any dyspnea at rest, cough, sputum production or wheezing. He does not have any chest pain, palpitation.  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 He had been exposed to fumes and smoke in his line of work. He has a 10-pack-year smoking history.  Interim History: He is started on Symbicort but has not noticed any difference. He continues to have some dyspnea on exertion associated with climbing stairs but this does not appear to be excessive. He does not have cough, sputum production, wheezing, dyspnea at rest.  Outpatient Encounter Prescriptions as of 12/01/2016  Medication Sig  . acetaminophen (TYLENOL) 500 MG tablet Take 500 mg by mouth every 6 (six) hours as needed for moderate pain.  [name] albuterol (PROVENTIL HFA;VENTOLIN HFA) 108 (90 Base) MCG/ACT inhaler Inhale 2 puffs into the lungs every 6 (six) hours as needed for shortness of breath.  . allopurinol (ZYLOPRIM) 100 MG tablet Take 1 tablet (100 mg total) by mouth daily.  [name] allopurinol (ZYLOPRIM) 300 MG tablet TAKE 1 TABLET ONE TIME DAILY  . amLODipine (NORVASC) 5 MG tablet TAKE 1 TABLET EVERY DAY  . aspirin EC 81 MG tablet Take 81 mg by mouth every morning.  . budesonide-formoterol (SYMBICORT) 80-4.5 MCG/ACT inhaler Inhale 2 puffs into the lungs 2 (two) times daily.  . Carboxymethylcellul-Glycerin (OPTIVE) 0.5-0.9 % SOLN Apply 1 drop to eye daily.  . cycloSPORINE (RESTASIS) 0.05 % ophthalmic emulsion Place 1 drop into both eyes 2 (two) times daily.  . diclofenac sodium (VOLTAREN) 1  % GEL Apply 1 application topically daily as needed (arthritis).  [name] diphenhydrAMINE (BENADRYL) 25 MG tablet Take 25 mg by mouth every 6 (six) hours as needed for allergies.  . fluticasone (FLONASE) 50 MCG/ACT nasal spray Place 2 sprays into both nostrils daily.  [name] gabapentin (NEURONTIN) 300 MG capsule Take 1 capsule (300 mg total) by mouth at bedtime.  [name] gemfibrozil (LOPID) 600 MG tablet TAKE 1/2 TABLET TWICE DAILY BEFORE MEALS  . hydrALAZINE (APRESOLINE) 50 MG tablet Take 1 tablet (50 mg total) by mouth 3 (three) times daily.  . IRON PO Take 50 mcg by mouth daily.  [name] levothyroxine (SYNTHROID, LEVOTHROID) 150 MCG tablet TAKE 1 TABLET ONE TIME DAILY 6 DAYS A WEEK AND 1/2 TABLET 1 DAY A WEEK  . losartan-hydrochlorothiazide (HYZAAR) 100-25 MG tablet Take 1 tablet by mouth daily.  . multivitamin (THERAGRAN) per tablet Take 1 tablet by mouth daily.    . Omega-3 Fatty Acids (FISH OIL PO) Take 1 capsule by mouth 2 (two) times daily.  [name] omeprazole (PRILOSEC) 20 MG capsule Take 1 capsule (20 mg total) by mouth every morning.  . ondansetron (ZOFRAN ODT) 8 MG disintegrating tablet Take 1 tablet (8 mg total) by mouth every 8 (eight) hours as needed for nausea or vomiting.  . potassium chloride (K-DUR) 10 MEQ tablet 1 tab po q day  . pravastatin (PRAVACHOL) 40 MG tablet TAKE 1 TABLET EVERY DAY  . sodium chloride (OCEAN) 0.65 % nasal spray Place 1 spray into both nostrils at  bedtime as needed for congestion.   Facility-Administered Encounter Medications as of 12/01/2016  Medication  . cloNIDine (CATAPRES) tablet 0.1 mg    Allergies as of 12/01/2016 - Review Complete 12/01/2016  Allergen Reaction Noted  . Metoclopramide hcl  06/12/2011    Past Medical History:  Diagnosis Date  . Arthritis   . Cancer ([place])    skin cancer  . Carotid stenosis    a. Carotid U/S 6/60: LICA < 63%, RICA 01-60%;  b.  Carotid U/S 1/09:  RICA 32-35%; LICA 5-73%; f/u 1 year  . Chest pain     Myoview in 2008 was normal.  Echo  7/09: EF 60%, normal wall motion, mild LVH, mild LAE.  [name] Complication of anesthesia    "stomach does not wake up"  . COPD (chronic obstructive pulmonary disease) ([place])   . GERD (gastroesophageal reflux disease)   . Gout   . History of chicken pox   . History of hemorrhoids   . History of kidney stones   . Hyperlipidemia    under control  . Hypertension    under control  . Hypothyroidism   . Ileus ([place])   . Inguinal hernia   . OSA (obstructive sleep apnea)   . Pancreatitis   . PONV (postoperative nausea and vomiting)   . Rosacea     Past Surgical History:  Procedure Laterality Date  . ABDOMINAL HERNIA REPAIR  07/15/12   Dr [name]  . BROW LIFT  05/07/01  . CYSTOSCOPY WITH URETEROSCOPY AND STENT PLACEMENT Right 01/28/2014   Procedure: CYSTOSCOPY WITH URETEROSCOPY, BASKET RETRIVAL AND  STENT PLACEMENT;  Surgeon: [name], MD;  Location: [facility];  Service: Urology;  Laterality: Right;  . epidural injections     multiple procedures  . [place]   multiple times  . EYE SURGERY Bilateral 03/25/10, 2012   cataract removal  . HEMORRHOID SURGERY  2014  . HIATAL HERNIA REPAIR  12/21/93  . HOLMIUM LASER APPLICATION Right 10/31/2540   Procedure: HOLMIUM LASER APPLICATION;  Surgeon: [name], MD;  Location: [facility];  Service: Urology;  Laterality: Right;  . INGUINAL HERNIA REPAIR Right 07/15/12   Dr [name], x2  . JOINT REPLACEMENT  07/25/04   right knee  . JOINT REPLACEMENT  07/13/10   left knee  . KNEE SURGERY  1995  . LAPAROSCOPIC CHOLECYSTECTOMY  09/20/06   with intraoperative cholangiogram and right inguinal herniorrhaphy with mesh   . LIGAMENT REPAIR Left 07/2013   shoulder  . LITHOTRIPSY     x2  . NASAL SEPTUM SURGERY  1975  . REFRACTIVE SURGERY Bilateral   . 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 SKIN CANCER DESTRUCTION     nose, ear  . TONSILLECTOMY  as child  . TOTAL HIP ARTHROPLASTY Left   . URETHRAL DILATION  1991   [name]    Family  History  Problem Relation Age of Onset  . Hypertension    . Cancer    . Osteoarthritis    . Cancer Mother     Social History   Social History  . Marital status: Married    Spouse name: N/A  . Number of children: 1  . Years of education: N/A   Occupational History  . firefighter     paints on the side   Social History Main Topics  . Smoking status: Former Smoker    Years: 11.00    Types: Cigarettes    Quit  date: 09/11/1978  . Smokeless tobacco: Never Used  . Alcohol use 0.0 oz/week     Comment: rare  . Drug use: No  . Sexual activity: Not on file   Other Topics Concern  . Not on file   Social History Narrative   Lives with his wife   He has one son- lives locally.   2 grandchildren   Has worked for [employer], trade [employer]   Enjoys wood working   Completed HS.  [employer]      Review of systems: Review of Systems  Constitutional: Negative for fever and chills.  HENT: Negative.   Eyes: Negative for blurred vision.  Respiratory: as per HPI  Cardiovascular: Negative for chest pain and palpitations.  Gastrointestinal: Negative for vomiting, diarrhea, blood per rectum. Genitourinary: Negative for dysuria, urgency, frequency and hematuria.  Musculoskeletal: Negative for myalgias, back pain and joint pain.  Skin: Negative for itching and rash.  Neurological: Negative for dizziness, tremors, focal weakness, seizures and loss of consciousness.  Endo/Heme/Allergies: Negative for environmental allergies.  Psychiatric/Behavioral: Negative for depression, suicidal ideas and hallucinations.  All other systems reviewed and are negative.  Physical Exam: Blood pressure 130/64, pulse 65, height 5\' 6"  (1.676 m), weight 174 lb (78.9 kg), SpO2 97 %. Gen:      No acute distress HEENT:  EOMI, sclera anicteric Neck:     No masses; no thyromegaly Lungs:    Clear to auscultation bilaterally; normal respiratory effort CV:         Regular rate and rhythm; no murmurs Abd:      + bowel  sounds; soft, non-tender; no palpable masses, no distension Ext:    No edema; adequate peripheral perfusion Skin:      Warm and dry; no rash Neuro: alert and oriented x 3 Psych: normal mood and affect  Data Reviewed: [facility]ENO 10/13/16- 9  CT chest 05/22/16-no PE, mild dependent atelectasis Chest x-ray 05/19/16-hyperinflation CT abdomen 01/17/14-lung images shows subsegmental atelectasis I reviewed all the images personally.  PFTs 12/01/16 FVC 3.84 (109%) FEV1 2.83 (113%) F/F 74 TLC 109% DLCO 93% Small airway disease  Assessment:  Consult for evaluation of dyspnea Mr. Dimalanta has dyspnea on exertion with a remote smoking history and exposure to fumes and smoke as a Airline pilot. His PFTs do not show any overt obstruction but only minimal small airway disease. He has been using the Symbicort but has not noticed any difference. I don't believe he needs to be on any controller medication as his symptoms are not excessive. I have asked him to stop the Symbicort. He'll use albuterol as needed.  He can return to clinic as needed if his symptoms get worse.  Plan/Recommendations: - D/C symbicort. Continue albuterol rescue inhaler.   Marshell Garfinkel MD [facility] Pager [phone] 12/01/2016, 2:19 PM  CC: [name], NP

## 2016-12-04 ENCOUNTER — Other Ambulatory Visit: Payer: Self-pay | Admitting: Family

## 2016-12-04 NOTE — Telephone Encounter (Signed)
Spoke with pt and advised him to call mail order as he should still have 1 refill on file from 09/12/16 rx, #90 x 1 refills. Pt voices understanding and will call [employer] mail order.

## 2016-12-04 NOTE — Telephone Encounter (Signed)
Caller name: Relationship to patient: Self Can be reached: [phone]  Pharmacy:  [facility], [place] [phone] (Phone) [phone] (Fax)     Reason for call: Refill gabapentin (NEURONTIN) 300 MG capsule

## 2016-12-07 ENCOUNTER — Ambulatory Visit: Payer: Medicare HMO | Admitting: Medical

## 2016-12-13 ENCOUNTER — Telehealth: Payer: Self-pay | Admitting: *Deleted

## 2016-12-13 NOTE — Telephone Encounter (Signed)
AWV scheduled 12/19/16 following PCP appt.

## 2016-12-18 NOTE — Progress Notes (Signed)
Subjective:   [name] Pratt is a 76 y.o. male who presents for Medicare Annual/Subsequent preventive examination.  Review of Systems:  No ROS.  Medicare Wellness Visit.  Cardiac Risk Factors include: advanced age (>36men, >9 women);dyslipidemia;hypertension;male gender  Sleep patterns: has daytime sleepiness, gets up 0-1 times nightly to void and sleeps 7-8 hours nightly. Wears CPAP nightly.  Home Safety/Smoke Alarms: Feels safe in home. Smoke alarms in place.  Living environment; residence and Firearm Safety: Lives w/ wife. 2-story house, firearms stored safely. Seat Belt Safety/Bike Helmet: Wears seat belt.   Counseling:   Eye Exam- Dr. Talbert Forest yearly. Dr. Blanchie Dessert specialist. Dental- Follows w/ Dr. Sharyn Lull [place] twice yearly.  Male:   CCS- last 12/23/12 w/ Dr. Earlean Shawl. Diverticulosis, internal hemorrhoids, otherwise normal. 10 year recall.      PSA-  Lab Results  Component Value Date   PSA 0.31 05/26/2013        Objective:    Vitals: BP 130/61 (BP Location: Left Arm, Cuff Size: Normal)   Pulse 65   Temp 97.7 F (36.5 C) (Oral)   Resp 16   Ht 5\' 7"  (1.702 m)   Wt 173 lb 6.4 oz (78.7 kg)   SpO2 97% Comment: room air  BMI 27.16 kg/m   Body mass index is 27.16 kg/m.  Tobacco History  Smoking Status  . Former Smoker  . Years: 11.00  . Types: Cigarettes  . Quit date: 09/11/1978  Smokeless Tobacco  . Never Used     Counseling given: Not Answered   Past Medical History:  Diagnosis Date  . Arthritis   . Cancer ([place])    skin cancer  . Carotid stenosis    a. Carotid U/S 3/00: LICA < 92%, RICA 33-00%;  b.  Carotid U/S 7/62:  RICA 26-33%; LICA 3-54%; f/u 1 year  . Chest pain     Myoview in 2008 was normal.  Echo 7/09: EF 60%, normal wall motion, mild LVH, mild LAE.  [name] Complication of anesthesia    "stomach does not wake up"  . COPD (chronic obstructive pulmonary disease) ([place])   . GERD (gastroesophageal reflux disease)   . Gout   . History of  chicken pox   . History of hemorrhoids   . History of kidney stones   . Hyperlipidemia    under control  . Hypertension    under control  . Hypothyroidism   . Ileus ([place])   . Inguinal hernia   . OSA (obstructive sleep apnea)   . Pancreatitis   . PONV (postoperative nausea and vomiting)   . Rosacea    Past Surgical History:  Procedure Laterality Date  . ABDOMINAL HERNIA REPAIR  07/15/12   Dr [name]  . BROW LIFT  05/07/01  . CYSTOSCOPY WITH URETEROSCOPY AND STENT PLACEMENT Right 01/28/2014   Procedure: CYSTOSCOPY WITH URETEROSCOPY, BASKET RETRIVAL AND  STENT PLACEMENT;  Surgeon: [name], MD;  [facility];  Service: Urology;  Laterality: Right;  . epidural injections     multiple procedures  . [place]   multiple times  . EYE SURGERY Bilateral 03/25/10, 2012   cataract removal  . HEMORRHOID SURGERY  2014  . HIATAL HERNIA REPAIR  12/21/93  . HOLMIUM LASER APPLICATION Right 5/62/5638   Procedure: HOLMIUM LASER APPLICATION;  Surgeon: [name], MD;  [facility];  Service: Urology;  Laterality: Right;  . INGUINAL HERNIA REPAIR Right 07/15/12   Dr [name], x2  . JOINT  REPLACEMENT  07/25/04   right knee  . JOINT REPLACEMENT  07/13/10   left knee  . KNEE SURGERY  1995  . LAPAROSCOPIC CHOLECYSTECTOMY  09/20/06   with intraoperative cholangiogram and right inguinal herniorrhaphy with mesh   . LIGAMENT REPAIR Left 07/2013   shoulder  . LITHOTRIPSY     x2  . NASAL SEPTUM SURGERY  1975  . REFRACTIVE SURGERY Bilateral   . 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 SKIN CANCER DESTRUCTION     nose, ear  . TONSILLECTOMY  as child  . TOTAL HIP ARTHROPLASTY Left   . URETHRAL DILATION  1991   [name]   Family History  Problem Relation Age of Onset  . Hypertension    . Cancer    . Osteoarthritis    . Cancer Mother    History  Sexual Activity  . Sexual activity: Not on file    Outpatient Encounter Prescriptions as of 12/19/2016  Medication  Sig  . acetaminophen (TYLENOL) 500 MG tablet Take 500 mg by mouth every 6 (six) hours as needed for moderate pain.  [name] albuterol (PROVENTIL HFA;VENTOLIN HFA) 108 (90 Base) MCG/ACT inhaler Inhale 2 puffs into the lungs every 6 (six) hours as needed for shortness of breath.  . allopurinol (ZYLOPRIM) 100 MG tablet Take 1 tablet (100 mg total) by mouth daily.  [name] allopurinol (ZYLOPRIM) 300 MG tablet TAKE 1 TABLET ONE TIME DAILY  . amLODipine (NORVASC) 5 MG tablet TAKE 1 TABLET EVERY DAY  . aspirin EC 81 MG tablet Take 81 mg by mouth every morning.  . budesonide-formoterol (SYMBICORT) 80-4.5 MCG/ACT inhaler Inhale 2 puffs into the lungs 2 (two) times daily.  . Carboxymethylcellul-Glycerin (OPTIVE) 0.5-0.9 % SOLN Apply 1 drop to eye daily.  . cycloSPORINE (RESTASIS) 0.05 % ophthalmic emulsion Place 1 drop into both eyes 2 (two) times daily.  . diclofenac sodium (VOLTAREN) 1 % GEL Apply 1 application topically daily as needed (arthritis).  [name] diphenhydrAMINE (BENADRYL) 25 MG tablet Take 25 mg by mouth every 6 (six) hours as needed for allergies.  . fluticasone (FLONASE) 50 MCG/ACT nasal spray Place 2 sprays into both nostrils daily.  [name] gabapentin (NEURONTIN) 300 MG capsule Take 1 capsule (300 mg total) by mouth at bedtime.  [name] gemfibrozil (LOPID) 600 MG tablet TAKE 1/2 TABLET TWICE DAILY BEFORE MEALS  . hydrALAZINE (APRESOLINE) 50 MG tablet Take 1 tablet (50 mg total) by mouth 3 (three) times daily.  . IRON PO Take 50 mcg by mouth daily.  [name] levothyroxine (SYNTHROID, LEVOTHROID) 150 MCG tablet TAKE 1 TABLET ONE TIME DAILY 6 DAYS A WEEK AND 1/2 TABLET 1 DAY A WEEK  . losartan-hydrochlorothiazide (HYZAAR) 100-25 MG tablet Take 1 tablet by mouth daily.  . multivitamin (THERAGRAN) per tablet Take 1 tablet by mouth daily.    . Omega-3 Fatty Acids (FISH OIL PO) Take 1 capsule by mouth 2 (two) times daily.  [name] omeprazole (PRILOSEC) 20 MG capsule Take 1 capsule (20 mg total) by mouth every morning.  . ondansetron  (ZOFRAN ODT) 8 MG disintegrating tablet Take 1 tablet (8 mg total) by mouth every 8 (eight) hours as needed for nausea or vomiting.  . potassium chloride (K-DUR) 10 MEQ tablet 1 tab po q day  . pravastatin (PRAVACHOL) 40 MG tablet TAKE 1 TABLET EVERY DAY  . sodium chloride (OCEAN) 0.65 % nasal spray Place 1 spray into both nostrils at bedtime as needed for congestion.   Facility-Administered Encounter Medications as of 12/19/2016  Medication  . cloNIDine (CATAPRES) tablet 0.1 mg    Activities of Daily Living In your present state of health, do you have any difficulty performing the following activities: 12/19/2016  Hearing? Y  Vision? N  Difficulty concentrating or making decisions? N  Walking or climbing stairs? N  Dressing or bathing? N  Doing errands, shopping? N  Preparing Food and eating ? N  Using the Toilet? N  In the past six months, have you accidently leaked urine? Y  Do you have problems with loss of bowel control? N  Managing your Medications? N  Managing your Finances? N  Housekeeping or managing your Housekeeping? N  Some recent data might be hidden    Patient Care Team: [name], NP as PCP - General (Internal Medicine) Hurman Horn, MD as Consulting Physician (Ophthalmology) [name], MD as Consulting Physician (Cardiology) Larey Seat, MD as Consulting Physician (Neurology) Marshell Garfinkel, MD as Consulting Physician (Pulmonary Disease) Darleen Crocker, MD as Consulting Physician (Ophthalmology) Nyra Capes, DDS as Consulting Physician (Dentistry)   Assessment:    Physical assessment deferred to PCP.  Exercise Activities and Dietary recommendations Current Exercise Habits: Home exercise routine, Type of exercise: Other - see comments (elliptical, stationary bike, and [employer]), Frequency (Times/Week): 2, Intensity: Moderate. He stays active doing yard work and other tasks around the house and neighborhood.  Diet (meal preparation, eat  out, water intake, caffeinated beverages, dairy products, fruits and vegetables): well balanced, on average, 2 meals per day. Wife prepares most meals. Regular diet. Does not eat a lot of fried foods. Like boiled fish. Drinks [employer] throughout the day. Breakfast: 2 eggs, bacon, biscuit and gravy at [employer]: varies-usually something at home. Lots of salad or pan fried meat.      Goals    . Increase physical activity    . Reduce portion size      Fall Risk Fall Risk  12/19/2016 06/19/2016 12/27/2015 11/24/2015 11/24/2015  Falls in the past year? No No Yes No No  Number falls in past yr: - - 1 - -  Injury with Fall? - - No - -  Risk for fall due to : - - - - -  Risk for fall due to (comments): - - - - -  Follow up - - Falls evaluation completed - -   Depression Screen PHQ 2/9 Scores 12/19/2016 06/19/2016 11/24/2015 11/24/2015  PHQ - 2 Score 0 0 0 0    Cognitive Function MMSE - Mini Mental State Exam 12/19/2016  Orientation to time 5  Orientation to Place 5  Registration 3  Attention/ Calculation 5  Recall 3  Language- name 2 objects 2  Language- repeat 1  Language- follow 3 step command 3  Language- read & follow direction 1  Write a sentence 1  Copy design 1  Total score 30        Immunization History  Administered Date(s) Administered  . Influenza Split 05/17/2012  . Influenza, High Dose Seasonal PF 06/19/2016  . Influenza,inj,Quad PF,36+ Mos 05/26/2013, 06/03/2014, 05/28/2015  . Pneumococcal Conjugate-13 08/25/2013  . Pneumococcal Polysaccharide-23 09/11/2008  . Tdap 12/27/2010  . Zoster 07/10/2008   Screening Tests Health Maintenance  Topic Date Due  . INFLUENZA VACCINE  04/11/2017  . TETANUS/TDAP  12/26/2020  . PNA vac Low Risk Adult  Completed      Plan:   Follow-up w/ PCP as scheduled.  During the course of the visit the patient was educated and counseled  about the following appropriate screening and preventive services:   Vaccines to include  Pneumococcal, Influenza,  Td, HCV  Cardiovascular Disease  Colorectal cancer screening  Diabetes screening  Prostate Cancer Screening  Glaucoma screening  Nutrition counseling   Patient Instructions (the written plan) was given to the patient.    Dorrene German, RN  12/19/2016

## 2016-12-18 NOTE — Progress Notes (Signed)
Pre visit review using our clinic review tool, if applicable. No additional management support is needed unless otherwise documented below in the visit note. 

## 2016-12-19 ENCOUNTER — Encounter: Payer: Self-pay | Admitting: Family

## 2016-12-19 ENCOUNTER — Ambulatory Visit (INDEPENDENT_AMBULATORY_CARE_PROVIDER_SITE_OTHER): Payer: Medicare HMO | Admitting: Family

## 2016-12-19 VITALS — BP 130/61 | HR 65 | Temp 97.7°F | Resp 16 | Ht 67.0 in | Wt 173.4 lb

## 2016-12-19 DIAGNOSIS — E611 Iron deficiency: Secondary | ICD-10-CM

## 2016-12-19 DIAGNOSIS — G6289 Other specified polyneuropathies: Secondary | ICD-10-CM

## 2016-12-19 DIAGNOSIS — E785 Hyperlipidemia, unspecified: Secondary | ICD-10-CM | POA: Diagnosis not present

## 2016-12-19 DIAGNOSIS — Z0000 Encounter for general adult medical examination without abnormal findings: Secondary | ICD-10-CM

## 2016-12-19 DIAGNOSIS — E039 Hypothyroidism, unspecified: Secondary | ICD-10-CM | POA: Diagnosis not present

## 2016-12-19 DIAGNOSIS — R739 Hyperglycemia, unspecified: Secondary | ICD-10-CM | POA: Diagnosis not present

## 2016-12-19 DIAGNOSIS — I10 Essential (primary) hypertension: Secondary | ICD-10-CM | POA: Diagnosis not present

## 2016-12-19 LAB — CBC WITH DIFFERENTIAL/PLATELET
BASOS PCT: 0.9 % (ref 0.0–3.0)
Basophils Absolute: 0.1 10*3/uL (ref 0.0–0.1)
Eosinophils Absolute: 0.2 10*3/uL (ref 0.0–0.7)
Eosinophils Relative: 2.8 % (ref 0.0–5.0)
HCT: 42.1 % (ref 39.0–52.0)
Hemoglobin: 14.1 g/dL (ref 13.0–17.0)
LYMPHS ABS: 2.3 10*3/uL (ref 0.7–4.0)
Lymphocytes Relative: 35.3 % (ref 12.0–46.0)
MCHC: 33.4 g/dL (ref 30.0–36.0)
MCV: 96.9 fl (ref 78.0–100.0)
MONO ABS: 0.8 10*3/uL (ref 0.1–1.0)
MONOS PCT: 11.7 % (ref 3.0–12.0)
NEUTROS PCT: 49.3 % (ref 43.0–77.0)
Neutro Abs: 3.3 10*3/uL (ref 1.4–7.7)
Platelets: 370 10*3/uL (ref 150.0–400.0)
RBC: 4.35 Mil/uL (ref 4.22–5.81)
RDW: 14.5 % (ref 11.5–15.5)
WBC: 6.6 10*3/uL (ref 4.0–10.5)

## 2016-12-19 LAB — LIPID PANEL
CHOLESTEROL: 127 mg/dL (ref 0–200)
HDL: 31.9 mg/dL — ABNORMAL LOW (ref >39.00)
LDL CALC: 70 mg/dL (ref 0–99)
NONHDL: 95.21
Total CHOL/HDL Ratio: 4
Triglycerides: 124 mg/dL (ref 0.0–149.0)
VLDL: 24.8 mg/dL (ref 0.0–40.0)

## 2016-12-19 LAB — BASIC METABOLIC PANEL
BUN: 30 mg/dL — ABNORMAL HIGH (ref 6–23)
CALCIUM: 9.7 mg/dL (ref 8.4–10.5)
CHLORIDE: 102 meq/L (ref 96–112)
CO2: 28 meq/L (ref 19–32)
Creatinine, Ser: 1.47 mg/dL (ref 0.40–1.50)
GFR: 49.39 mL/min — ABNORMAL LOW (ref >60.00)
Glucose, Bld: 109 mg/dL — ABNORMAL HIGH (ref 70–99)
POTASSIUM: 3.8 meq/L (ref 3.5–5.1)
Sodium: 139 mEq/L (ref 135–145)

## 2016-12-19 LAB — HEMOGLOBIN A1C: Hgb A1c MFr Bld: 6.2 % (ref 4.6–6.5)

## 2016-12-19 LAB — TSH: TSH: 1.18 u[IU]/mL (ref 0.35–4.50)

## 2016-12-19 LAB — IRON: IRON: 102 ug/dL (ref 42–165)

## 2016-12-19 NOTE — Assessment & Plan Note (Signed)
Clinically stable on synthroid, continue same.  

## 2016-12-19 NOTE — Assessment & Plan Note (Signed)
>>  ASSESSMENT AND PLAN FOR BENIGN ESSENTIAL HYPERTENSION WRITTEN ON 12/19/2016 11:25 AM BY O'SULLIVAN, Lian Pounds, NP  BP stable on current meds, continue same, obtain follow up bmet to assess renal function.

## 2016-12-19 NOTE — Patient Instructions (Addendum)
Please complete lab work prior to leaving.    [name] Pratt , Thank you for taking time to come for your Medicare Wellness Visit. I appreciate your ongoing commitment to your health goals. Please review the following plan we discussed and let me know if I can assist you in the future.   These are the goals we discussed: Goals    . Increase physical activity    . Reduce portion size       This is a list of the screening recommended for you and due dates:  Health Maintenance  Topic Date Due  . Flu Shot  04/11/2017  . Tetanus Vaccine  12/26/2020  . Pneumonia vaccines  Completed

## 2016-12-19 NOTE — Progress Notes (Signed)
Subjective:    Patient ID: [name] Pratt, male    DOB: September 22, 1940, 76 y.o.   MRN: [id number]  HPI  [name] Pratt is a 76 yr old male who presents today for follow up.  HTN- Denies SOB or swelling.  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 Reports that this does help his symptoms.   Gout- maintained on allopurinol.  Denies gout flares.   Hypothyroid- maintained on synthroid. Lab Results  Component Value Date   TSH 0.92 11/24/2015   Iron deficiency- takes once daily.   Hypothyroid- mainained on synthroid.   Hyperlipidemia-  Lab Results  Component Value Date   CHOL 142 11/24/2015   HDL 32.50 (L) 11/24/2015   LDLCALC 77 11/24/2015   TRIG 162.0 (H) 11/24/2015   CHOLHDL 4 11/24/2015    Review of Systems See HPI  Past Medical History:  Diagnosis Date  . Arthritis   . Cancer ([place])    skin cancer  . Carotid stenosis    a. Carotid U/S 0/26: LICA < 37%, RICA 85-88%;  b.  Carotid U/S 5/02:  RICA 77-41%; LICA 2-87%; f/u 1 year  . Chest pain     Myoview in 2008 was normal.  Echo 7/09: EF 60%, normal wall motion, mild LVH, mild LAE.  [name] Complication of anesthesia    "stomach does not wake up"  . COPD (chronic obstructive pulmonary disease) ([place])   . GERD (gastroesophageal reflux disease)   . Gout   . History of chicken pox   . History of hemorrhoids   . History of kidney stones   . Hyperlipidemia    under control  . Hypertension    under control  . Hypothyroidism   . Ileus ([place])   . Inguinal hernia   . OSA (obstructive sleep apnea)   . Pancreatitis   . PONV (postoperative nausea and vomiting)   . Rosacea      Social History   Social History  . Marital status: Married    Spouse name: N/A  . Number of children: 1  . Years of education: N/A   Occupational History  . firefighter     paints on the side   Social History Main Topics  . Smoking status:  Former Smoker    Years: 11.00    Types: Cigarettes    Quit date: 09/11/1978  . Smokeless tobacco: Never Used  . Alcohol use 0.0 oz/week     Comment: rare  . Drug use: No  . Sexual activity: Not on file   Other Topics Concern  . Not on file   Social History Narrative   Lives with his wife   He has one son- lives locally.   2 grandchildren   Has worked for [employer], trade [employer]   Enjoys wood working   Completed HS.  [employer]       Past Surgical History:  Procedure Laterality Date  . ABDOMINAL HERNIA REPAIR  07/15/12   Dr [name]  . BROW LIFT  05/07/01  . CYSTOSCOPY WITH URETEROSCOPY AND STENT PLACEMENT Right 01/28/2014   Procedure: CYSTOSCOPY WITH URETEROSCOPY, BASKET RETRIVAL AND  STENT PLACEMENT;  Surgeon: [name], MD;  Location: [facility];  Service: Urology;  Laterality: Right;  . epidural injections     multiple procedures  . [place]   multiple times  . EYE SURGERY Bilateral 03/25/10, 2012  cataract removal  . HEMORRHOID SURGERY  2014  . HIATAL HERNIA REPAIR  12/21/93  . HOLMIUM LASER APPLICATION Right 1/61/0960   Procedure: HOLMIUM LASER APPLICATION;  Surgeon: [name], MD;  Location: [facility];  Service: Urology;  Laterality: Right;  . INGUINAL HERNIA REPAIR Right 07/15/12   Dr [name], x2  . JOINT REPLACEMENT  07/25/04   right knee  . JOINT REPLACEMENT  07/13/10   left knee  . KNEE SURGERY  1995  . LAPAROSCOPIC CHOLECYSTECTOMY  09/20/06   with intraoperative cholangiogram and right inguinal herniorrhaphy with mesh   . LIGAMENT REPAIR Left 07/2013   shoulder  . LITHOTRIPSY     x2  . NASAL SEPTUM SURGERY  1975  . REFRACTIVE SURGERY Bilateral   . 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 SKIN CANCER DESTRUCTION     nose, ear  . TONSILLECTOMY  as child  . TOTAL HIP ARTHROPLASTY Left   . URETHRAL DILATION  1991   [name]    Family History  Problem Relation Age of Onset  . Hypertension    . Cancer    . Osteoarthritis    . Cancer  Mother     Allergies  Allergen Reactions  . Metoclopramide Hcl     anxiety    Current Outpatient Prescriptions on File Prior to Visit  Medication Sig Dispense Refill  . acetaminophen (TYLENOL) 500 MG tablet Take 500 mg by mouth every 6 (six) hours as needed for moderate pain.    [name] albuterol (PROVENTIL HFA;VENTOLIN HFA) 108 (90 Base) MCG/ACT inhaler Inhale 2 puffs into the lungs every 6 (six) hours as needed for shortness of breath. 3 Inhaler 1  . allopurinol (ZYLOPRIM) 100 MG tablet Take 1 tablet (100 mg total) by mouth daily. 90 tablet 1  . allopurinol (ZYLOPRIM) 300 MG tablet TAKE 1 TABLET ONE TIME DAILY 90 tablet 1  . amLODipine (NORVASC) 5 MG tablet TAKE 1 TABLET EVERY DAY 90 tablet 1  . aspirin EC 81 MG tablet Take 81 mg by mouth every morning.    . budesonide-formoterol (SYMBICORT) 80-4.5 MCG/ACT inhaler Inhale 2 puffs into the lungs 2 (two) times daily. 2 Inhaler 0  . Carboxymethylcellul-Glycerin (OPTIVE) 0.5-0.9 % SOLN Apply 1 drop to eye daily.    . cycloSPORINE (RESTASIS) 0.05 % ophthalmic emulsion Place 1 drop into both eyes 2 (two) times daily.    . diclofenac sodium (VOLTAREN) 1 % GEL Apply 1 application topically daily as needed (arthritis). 100 g 2  . diphenhydrAMINE (BENADRYL) 25 MG tablet Take 25 mg by mouth every 6 (six) hours as needed for allergies.    . fluticasone (FLONASE) 50 MCG/ACT nasal spray Place 2 sprays into both nostrils daily. 48 g 1  . gabapentin (NEURONTIN) 300 MG capsule Take 1 capsule (300 mg total) by mouth at bedtime. 90 capsule 1  . gemfibrozil (LOPID) 600 MG tablet TAKE 1/2 TABLET TWICE DAILY BEFORE MEALS 90 tablet 1  . hydrALAZINE (APRESOLINE) 50 MG tablet Take 1 tablet (50 mg total) by mouth 3 (three) times daily. 270 tablet 1  . IRON PO Take 50 mcg by mouth daily.    [name] levothyroxine (SYNTHROID, LEVOTHROID) 150 MCG tablet TAKE 1 TABLET ONE TIME DAILY 6 DAYS A WEEK AND 1/2 TABLET 1 DAY A WEEK 78 tablet 1  . losartan-hydrochlorothiazide (HYZAAR)  100-25 MG tablet Take 1 tablet by mouth daily. 90 tablet 2  . multivitamin (THERAGRAN) per tablet Take 1 tablet by mouth daily.      [name]  Omega-3 Fatty Acids (FISH OIL PO) Take 1 capsule by mouth 2 (two) times daily.    [name] omeprazole (PRILOSEC) 20 MG capsule Take 1 capsule (20 mg total) by mouth every morning. 90 capsule 1  . ondansetron (ZOFRAN ODT) 8 MG disintegrating tablet Take 1 tablet (8 mg total) by mouth every 8 (eight) hours as needed for nausea or vomiting. 20 tablet 0  . potassium chloride (K-DUR) 10 MEQ tablet 1 tab po q day 4 tablet 0  . pravastatin (PRAVACHOL) 40 MG tablet TAKE 1 TABLET EVERY DAY 90 tablet 1  . sodium chloride (OCEAN) 0.65 % nasal spray Place 1 spray into both nostrils at bedtime as needed for congestion.     Current Facility-Administered Medications on File Prior to Visit  Medication Dose Route Frequency Provider Last Rate Last Dose  . cloNIDine (CATAPRES) tablet 0.1 mg  0.1 mg Oral Once [name], NP        BP 130/61 (BP Location: Left Arm, Cuff Size: Normal)   Pulse 65   Temp 97.7 F (36.5 C) (Oral)   Resp 16   Ht 5\' 7"  (1.702 m)   Wt 173 lb 6.4 oz (78.7 kg)   SpO2 97% Comment: room air  BMI 27.16 kg/m       Objective:   Physical Exam  Constitutional: He is oriented to person, place, and time. He appears well-developed and well-nourished. No distress.  HENT:  Head: Normocephalic and atraumatic.  Cardiovascular: Normal rate and regular rhythm.   No murmur heard. Pulmonary/Chest: Effort normal and breath sounds normal. No respiratory distress. He has no wheezes. He has no rales.  Musculoskeletal: He exhibits no edema.  Neurological: He is alert and oriented to person, place, and time.  Skin: Skin is warm and dry.  Psychiatric: He has a normal mood and affect. His behavior is normal. Thought content normal.          Assessment & Plan:   Lab Results  Component Value Date   HGBA1C 6.1 11/24/2015

## 2016-12-19 NOTE — Assessment & Plan Note (Signed)
Obtain follow up a1c.

## 2016-12-19 NOTE — Assessment & Plan Note (Signed)
BP stable on current meds, continue same, obtain follow up bmet to assess renal function.

## 2016-12-19 NOTE — Assessment & Plan Note (Signed)
Obtain follow up lipid panel.

## 2016-12-19 NOTE — Assessment & Plan Note (Signed)
Stable on gabapentin, continue same.  

## 2016-12-21 NOTE — Progress Notes (Signed)
Noted and agree. 

## 2017-01-10 DIAGNOSIS — G4733 Obstructive sleep apnea (adult) (pediatric): Secondary | ICD-10-CM | POA: Diagnosis not present

## 2017-01-15 ENCOUNTER — Other Ambulatory Visit: Payer: Self-pay | Admitting: Family

## 2017-01-16 NOTE — Telephone Encounter (Signed)
Refill sent per [facility] refill protocol/SLS  

## 2017-01-24 ENCOUNTER — Other Ambulatory Visit: Payer: Self-pay | Admitting: Family

## 2017-02-21 NOTE — Progress Notes (Signed)
Patient ID: [name] Pratt, male   DOB: September 18, 1940, 76 y.o.   MRN: [id number]     CARDIOLOGY OFFICE NOTE  Date:  02/23/2017    [name] Pratt Date of Birth: 02/03/1941 Medical Record #[id number]  PCP:  [name], NP  Cardiologist:  Johnsie Cancel    No chief complaint on file.   History of Present Illness:  [name] Pratt is a 76 y.o. male   has a history of HLD, GERD, carotid disease, HTN and COPD.    Remote echo from 2009 with normal EF. Normal Myoview from 2008.   Presented to the ER  08/2015  - BP had been elevated for about 2 days - given dose of Clonidine. 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 Arthritis   . Cancer ([place])    skin cancer  . Carotid stenosis    a. Carotid U/S 1/44: LICA < 31%, RICA 54-00%;  b.  Carotid U/S 8/67:  RICA 61-95%; LICA 0-93%; f/u 1 year  . Chest pain     Myoview in 2008 was normal.  Echo 7/09: EF 60%, normal wall motion, mild LVH, mild LAE.  [name] Complication of anesthesia    "stomach does not wake up"  . COPD (chronic obstructive pulmonary disease) ([place])   . GERD (gastroesophageal reflux disease)   . Gout   . History of chicken pox   . History of hemorrhoids   . History of kidney stones   . Hyperlipidemia    under control  . Hypertension    under control  . Hypothyroidism   . Ileus ([place])   . Inguinal hernia   . OSA (obstructive sleep apnea)   . Pancreatitis   . PONV (postoperative nausea and vomiting)   . Rosacea     Past Surgical History:  Procedure Laterality Date  . ABDOMINAL HERNIA REPAIR  07/15/12   Dr [name]   . BROW LIFT  05/07/01  . CYSTOSCOPY WITH URETEROSCOPY AND STENT PLACEMENT Right 01/28/2014   Procedure: CYSTOSCOPY WITH URETEROSCOPY, BASKET RETRIVAL AND  STENT PLACEMENT;  Surgeon: [name], MD;  Location: [facility];  Service: Urology;  Laterality: Right;  . epidural injections     multiple procedures  . [place]   multiple times  . EYE SURGERY Bilateral 03/25/10, 2012   cataract removal  . HEMORRHOID SURGERY  2014  . HIATAL HERNIA REPAIR  12/21/93  . HOLMIUM LASER APPLICATION Right 2/67/1245   Procedure: HOLMIUM LASER APPLICATION;  Surgeon: [name], MD;  Location: [facility];  Service: Urology;  Laterality: Right;  . INGUINAL HERNIA REPAIR Right 07/15/12   Dr [name], x2  . JOINT REPLACEMENT  07/25/04   right knee  . JOINT REPLACEMENT  07/13/10   left knee  . KNEE SURGERY  1995  . LAPAROSCOPIC CHOLECYSTECTOMY  09/20/06   with intraoperative cholangiogram and right inguinal herniorrhaphy with mesh   . LIGAMENT REPAIR Left 07/2013   shoulder  . LITHOTRIPSY     x2  . NASAL SEPTUM SURGERY  1975  . REFRACTIVE SURGERY Bilateral   . 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 SKIN CANCER DESTRUCTION     nose, ear  . TONSILLECTOMY  as child  . TOTAL HIP ARTHROPLASTY Left   . URETHRAL DILATION  1991   [name]     Medications: Current Outpatient Prescriptions  Medication Sig Dispense Refill  . acetaminophen (TYLENOL) 500 MG tablet Take 500 mg by mouth every 6 (six) hours as needed for moderate pain.    [name] albuterol (PROVENTIL HFA;VENTOLIN HFA) 108 (90 Base) MCG/ACT inhaler Inhale 2 puffs into the lungs every 6 (six) hours as needed for shortness of breath. 3 Inhaler 1  . allopurinol (ZYLOPRIM) 100 MG tablet TAKE 1 TABLET (100 MG TOTAL) BY MOUTH DAILY. 90 tablet 1  . allopurinol (ZYLOPRIM) 300 MG tablet TAKE 1 TABLET ONE TIME DAILY 90 tablet 1  . amLODipine (NORVASC) 5 MG tablet TAKE 1 TABLET EVERY DAY 90 tablet 1  . aspirin EC 81 MG tablet Take 81 mg by mouth  every morning.    . budesonide-formoterol (SYMBICORT) 80-4.5 MCG/ACT inhaler Inhale 2 puffs into the lungs 2 (two) times daily. 2 Inhaler 0  . Carboxymethylcellul-Glycerin (OPTIVE) 0.5-0.9 % SOLN Apply 1 drop to eye daily.    . cycloSPORINE (RESTASIS) 0.05 % ophthalmic emulsion Place 1 drop into both eyes 2 (two) times daily.    . diclofenac sodium (VOLTAREN) 1 % GEL Apply 1 application topically daily as needed (arthritis). 100 g 2  . diphenhydrAMINE (BENADRYL) 25 MG tablet Take 25 mg by mouth every 6 (six) hours as needed for allergies.    [name] gabapentin (NEURONTIN) 300 MG capsule Take 1 capsule (300 mg total) by mouth at bedtime. 90 capsule 1  . gemfibrozil (LOPID) 600 MG tablet TAKE 1/2 TABLET TWICE DAILY BEFORE MEALS 90 tablet 1  . hydrALAZINE (APRESOLINE) 50 MG tablet Take 1 tablet (50 mg total) by mouth 3 (three) times daily. 270 tablet 1  . IRON PO Take 50 mcg by mouth daily.    [name] levothyroxine (SYNTHROID, LEVOTHROID) 150 MCG tablet TAKE 1 TABLET ONE TIME DAILY 6 DAYS A WEEK AND 1/2 TABLET 1 DAY A WEEK 78 tablet 1  . losartan-hydrochlorothiazide (HYZAAR) 100-25 MG tablet Take 1 tablet by mouth daily. 90 tablet 2  . multivitamin (THERAGRAN) per tablet Take 1 tablet by mouth daily.      . Omega-3 Fatty Acids (FISH OIL PO) Take 1 capsule by mouth 2 (two) times daily.    [name] omeprazole (PRILOSEC) 20 MG capsule Take 1 capsule (20 mg total) by mouth every morning. 90 capsule 1  . ondansetron (ZOFRAN ODT) 8 MG disintegrating tablet Take 1 tablet (8 mg total) by mouth every 8 (eight) hours as needed for nausea or vomiting. 20 tablet 0  . potassium chloride (K-DUR) 10 MEQ tablet 1 tab po q day 4 tablet 0  . pravastatin (PRAVACHOL) 40 MG tablet TAKE 1 TABLET EVERY DAY 90 tablet 1  . sodium chloride (OCEAN) 0.65 % nasal spray Place 1 spray into both nostrils at bedtime as needed for congestion.     Current Facility-Administered Medications  Medication Dose Route Frequency Provider Last Rate Last Dose    . cloNIDine (CATAPRES) tablet 0.1 mg  0.1 mg Oral Once [name], NP        Allergies: Allergies  Allergen Reactions  . Metoclopramide Hcl     anxiety    Social History: The patient  reports that he quit smoking about 67  years ago. His smoking use included Cigarettes. He quit after 11.00 years of use. He has never used smokeless tobacco. He reports that he drinks alcohol. He reports that he does not use drugs.   Family History: The patient's family history includes Cancer in his mother.   Review of Systems: Please see the history of present illness.   Otherwise, the review of systems is positive for none.   All other systems are reviewed and negative.   Physical Exam: VS:  BP 140/84   Pulse 60   Ht 5\' 7"  (1.702 m)   Wt 79.3 kg (174 lb 12.8 oz)   SpO2 96%   BMI 27.38 kg/m  .  BMI Body mass index is 27.38 kg/m.  Wt Readings from Last 3 Encounters:  02/23/17 79.3 kg (174 lb 12.8 oz)  12/19/16 78.7 kg (173 lb 6.4 oz)  12/01/16 78.9 kg (174 lb)   BP 140/84   Pulse 60   Ht 5\' 7"  (1.702 m)   Wt 79.3 kg (174 lb 12.8 oz)   SpO2 96%   BMI 27.38 kg/m    Affect appropriate Healthy:  appears stated age 33: normal Neck supple with no adenopathy JVP normal left  bruits no thyromegaly Lungs clear with no wheezing and good diaphragmatic motion Heart:  S1/S2 no murmur, no rub, gallop or click PMI normal Abdomen: benighn, BS positve, no tenderness, no AAA no bruit.  No HSM or HJR Distal pulses intact with no bruits No edema Neuro non-focal Skin warm and dry No muscular weakness Post bilateral TKR     LABORATORY DATA:  EKG:  SB rate 54 RBBB LAFB LVH   02/23/17  SR rate 60 RBBB LAFB LVH no change   Lab Results  Component Value Date   WBC 6.6 12/19/2016   HGB 14.1 12/19/2016   HCT 42.1 12/19/2016   PLT 370.0 12/19/2016   GLUCOSE 109 (H) 12/19/2016   CHOL 127 12/19/2016   TRIG 124.0 12/19/2016   HDL 31.90 (L) 12/19/2016   LDLCALC 70 12/19/2016   ALT  24 11/30/2016   AST 26 11/30/2016   NA 139 12/19/2016   K 3.8 12/19/2016   CL 102 12/19/2016   CREATININE 1.47 12/19/2016   BUN 30 (H) 12/19/2016   CO2 28 12/19/2016   TSH 1.18 12/19/2016   PSA 0.31 05/26/2013   INR 1.06 12/11/2012   HGBA1C 6.2 12/19/2016    BNP (last 3 results) No results for input(s): BNP in the last 8760 hours.  ProBNP (last 3 results) No results for input(s): PROBNP in the last 8760 hours.   Other Studies Reviewed Today:   Assessment/Plan: 1. HTN -  Improved on current meds his BP cuff inaccurate adjust time of day taking meds Low sodium diet  2. HLD - on statin therapy.   3. Carotid disease -  05/26/16 Duplex 40-59%  Bilateral dx.  F/u duplex 05/2017   4. COPD with stable dyspnea.   5. 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  The patient does not have concerns regarding medicines other than what has been noted above.  The following changes have been made:  See above.  Labs/ tests ordered today include:  -   No orders of the defined types were placed in this encounter.    Disposition:      F.u with me 6 months   Jenkins Rouge

## 2017-02-23 ENCOUNTER — Encounter: Payer: Self-pay | Admitting: Cardiovascular Disease

## 2017-02-23 ENCOUNTER — Ambulatory Visit (INDEPENDENT_AMBULATORY_CARE_PROVIDER_SITE_OTHER): Payer: Medicare HMO | Admitting: Cardiovascular Disease

## 2017-02-23 VITALS — BP 140/84 | HR 60 | Ht 67.0 in | Wt 174.8 lb

## 2017-02-23 DIAGNOSIS — I6523 Occlusion and stenosis of bilateral carotid arteries: Secondary | ICD-10-CM

## 2017-02-23 DIAGNOSIS — I10 Essential (primary) hypertension: Secondary | ICD-10-CM | POA: Diagnosis not present

## 2017-02-23 NOTE — Addendum Note (Signed)
Addended by: Michae Kava on: 02/23/2017 10:03 AM   Modules accepted: Orders

## 2017-02-23 NOTE — Patient Instructions (Signed)
Medication Instructions:  1. Your physician recommends that you continue on your current medications as directed. Please refer to the Current Medication list given to you today.   Labwork: NONE ORDRED   Testing/Procedures: 1. Your physician has requested that you have a carotid duplex. This test is an ultrasound of the carotid arteries in your neck. It looks at blood flow through these arteries that supply the brain with blood. Allow one hour for this exam. There are no restrictions or special instructions. THIS IS TO BE DONE IN 04/2017    Follow-Up: Your physician wants you to follow-up in: 1 YEAR WITH DR. Johnsie Cancel You will receive a reminder letter in the mail two months in advance. If you don't receive a letter, please call our office to schedule the follow-up appointment.   Any Other Special Instructions Will Be Listed Below (If Applicable).     If you need a refill on your cardiac medications before your next appointment, please call your pharmacy.

## 2017-02-26 ENCOUNTER — Encounter: Payer: Self-pay | Admitting: Family

## 2017-02-26 ENCOUNTER — Telehealth: Payer: Self-pay | Admitting: Family

## 2017-02-26 DIAGNOSIS — Z0000 Encounter for general adult medical examination without abnormal findings: Secondary | ICD-10-CM

## 2017-02-26 MED ORDER — ALLOPURINOL 300 MG PO TABS: ORAL_TABLET | ORAL | 1 refills | Status: DC

## 2017-02-26 MED ORDER — GABAPENTIN 300 MG PO CAPS: 300.0000 mg | ORAL_CAPSULE | Freq: Every day | ORAL | 1 refills | Status: DC

## 2017-02-26 NOTE — Telephone Encounter (Addendum)
error:[id number] ° °

## 2017-02-26 NOTE — Telephone Encounter (Signed)
According to our records, allopurinol 100mg  was refilled on 01/16/17, #90 x 1 refill. Gabapentin will be due for new refills in July as well as allopurinol 300mg . Left detailed message on pt's voicemail to ask [employer] to release allopurinol rx from 01/16/17 and I will send refills on allopurinol 300mg  and gabapentin. Refills sent.

## 2017-02-26 NOTE — Telephone Encounter (Signed)
Looks like insurance may also require referral from PCP.

## 2017-02-26 NOTE — Telephone Encounter (Signed)
Caller name: Relationship to patient: Self Can be reached: [phone]  Pharmacy:  [facility], [place] [phone] (Phone) [phone] (Fax)   Reason for call: Refill gabapentin (NEURONTIN) 300 MG capsule allopurinol (ZYLOPRIM) 100 MG tablet

## 2017-02-26 NOTE — Telephone Encounter (Signed)
Relation to IH:DTPN Call back number:[phone]   Reason for call:  Patient requesting a referral to [place]. Hollar, MD dermatologist for annual follow up, please advise

## 2017-02-28 ENCOUNTER — Ambulatory Visit: Payer: Commercial Managed Care - HMO | Admitting: Neurology

## 2017-03-05 ENCOUNTER — Encounter: Payer: Self-pay | Admitting: Neurology

## 2017-03-06 ENCOUNTER — Ambulatory Visit (INDEPENDENT_AMBULATORY_CARE_PROVIDER_SITE_OTHER): Payer: Medicare HMO | Admitting: Neurology

## 2017-03-06 ENCOUNTER — Encounter (INDEPENDENT_AMBULATORY_CARE_PROVIDER_SITE_OTHER): Payer: Self-pay

## 2017-03-06 ENCOUNTER — Encounter: Payer: Self-pay | Admitting: Neurology

## 2017-03-06 VITALS — BP 143/69 | HR 60 | Ht 67.0 in | Wt 174.5 lb

## 2017-03-06 DIAGNOSIS — I6521 Occlusion and stenosis of right carotid artery: Secondary | ICD-10-CM | POA: Diagnosis not present

## 2017-03-06 DIAGNOSIS — J449 Chronic obstructive pulmonary disease, unspecified: Secondary | ICD-10-CM

## 2017-03-06 DIAGNOSIS — G4733 Obstructive sleep apnea (adult) (pediatric): Secondary | ICD-10-CM | POA: Diagnosis not present

## 2017-03-06 DIAGNOSIS — Z9989 Dependence on other enabling machines and devices: Secondary | ICD-10-CM

## 2017-03-06 HISTORY — DX: Occlusion and stenosis of right carotid artery: I65.21

## 2017-03-06 NOTE — Progress Notes (Signed)
PATIENT: [name] Pratt DOB: 21-Dec-1940  REASON FOR VISIT: follow up HISTORY FROM: patient  HISTORY OF PRESENT ILLNESS:  Interval history from 03/06/2017, I have the pleasure of seeing [name] today who has again been an excellent compliant CPAP user with 100% compliance and an average user time of 7 hours and 33 minutes, CPAP is set to 12 cm water pressure with 3 cm EPR and his apnea index is 6.4. The residual apneas are mostly obstructive in nature. The patient has a history of complex and central apnea. His January download showed an AHI of 5.7 when he used 12 cm water pressure. I think we do not need to adjust it any further. He still has a low degree of fatigue at only 19 points and an average Epworth sleepiness score of 11 points. He does not endorse a significant tendency to depression. He has hypothyroidism, controlled HTN, no other causes for EDS. No history of Asthma , but COPD.   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 Wife has OSA and is not compliant with CPAP, just had a heart attack.   Stays at 12 cm water with 3 cm EPR. Elite ResMed machine ( bare bone model )     Interval history from 08/29/2016. Mr. [name] is an established CPAP user, and frequently had high residual AHI eyes. In 2015 his AHI was 13.8 and his pressure on the machine was adjusted to 9 cm water. In 2016 his AHI was 8.5. Today it is back to 13.7 with high compliance he has used the machine 100% over 4 hours nightly, with 7 hours 37 minutes on average, CPAP was set to 11 cm water with 3 cm EPR and the apnea index is 9.8. Mostly obstructive apneas. He does have one point for central apneas per hour of sleep and 8.4 obstructive apneas. I would like for him to be on an auto titrating if possible. I'm not sure his machine which is an S9 Elite can be set this way if not I will ask to adjust the pressure to 11.5 cm a tiny increase. He had tried it once at 13 cm water was highly uncomfortable. He doesn't wake  up tired but he goes to sleep easier. CD  HISTORY 09/02/15: [name] is a 76 year old male with a history of obstructive sleep apnea. He returns today for a compliance download. At the last visit his pressure settings were adjusted. The patient brought his memory card with him today however we areunable to obtain a download. We will call his DME company to see if they can fax 1 over. The patient reports that he does use the CPAP nightly. He is tolerating the current pressure settings. He states that he typically goes to bed around 11 PM and arises at 7:30 AM. He does wake up during the night however he is able to go right back to sleep.urrently uses the full face mask. He states that he does not have a leak with the mask unless he turns over to his side. At that time if it does start leaking he can adjust this mass and it creates a better seal. Overall he feels that he is doingwell. His Epworth sleepiness score is 10 and fatigue severity score is 25. He returns today for an evaluation.  HISTORY 09/16/14: [name] is a 76 year old male with history of obstructive sleep Apnea. He returns today for a 30 daydownload. At the last visit his AHI was 13.8 so his pressure  setting was adjusted to 9cm. He brought his machine with him today but we are unable to get a recent download. the patient assures me that his DME company changed his settings and he has been using his machine nightly. Overall he doesn't notice a significant change with the pressure change other than sometimes the pressure is "strong" and can blow the mask off. The patient states that he feels that he is getting good sleep. If he sits down in front of a computer or tv after getting up he may tend to doze off. He still notices some daytime sleepiness. He gets about 7 hours of sleep. He does feel that the CPAP has helped.   ADDEDUM: We were able to get a download from the patient's DME: it showed an AHI of 14.4 on 9 cm H2O with EPR 2. His  compliance is 90%. No leak noted. He uses his machine on average 6 hours and 40 minutes.   REVIEW OF SYSTEMS: Out of a complete 14 system review of symptoms, the patient complains only of the following symptoms, and all other reviewed systems are negative.  Fatigue, double vision, hearing loss, shortness of breath, murmur, restless leg, muscle cramps, walking difficulty, neck stiffness, itching, urgency, swollen abdomen, numbness, weakness  Epworth sleepiness score 13 fatigue severity score is 24  ALLERGIES: Allergies  Allergen Reactions  . Metoclopramide Hcl     anxiety    HOME MEDICATIONS: Outpatient Medications Prior to Visit  Medication Sig Dispense Refill  . albuterol (PROVENTIL HFA;VENTOLIN HFA) 108 (90 Base) MCG/ACT inhaler Inhale 2 puffs into the lungs every 6 (six) hours as needed for shortness of breath. 3 Inhaler 1  . allopurinol (ZYLOPRIM) 100 MG tablet TAKE 1 TABLET (100 MG TOTAL) BY MOUTH DAILY. 90 tablet 1  . allopurinol (ZYLOPRIM) 300 MG tablet TAKE 1 TABLET ONE TIME DAILY 90 tablet 1  . amLODipine (NORVASC) 5 MG tablet TAKE 1 TABLET EVERY DAY 90 tablet 1  . aspirin EC 81 MG tablet Take 81 mg by mouth every morning.    . Carboxymethylcellul-Glycerin (OPTIVE) 0.5-0.9 % SOLN Apply 1 drop to eye daily.    . cycloSPORINE (RESTASIS) 0.05 % ophthalmic emulsion Place 1 drop into both eyes 2 (two) times daily.    . diclofenac sodium (VOLTAREN) 1 % GEL Apply 1 application topically daily as needed (arthritis). 100 g 2  . diphenhydrAMINE (BENADRYL) 25 MG tablet Take 25 mg by mouth every 6 (six) hours as needed for allergies.    [name] Pratt gabapentin (NEURONTIN) 300 MG capsule Take 1 capsule (300 mg total) by mouth at bedtime. 90 capsule 1  . gemfibrozil (LOPID) 600 MG tablet TAKE 1/2 TABLET TWICE DAILY BEFORE MEALS 90 tablet 1  . hydrALAZINE (APRESOLINE) 50 MG tablet Take 1 tablet (50 mg total) by mouth 3 (three) times daily. 270 tablet 1  . IRON PO Take 50 mcg by mouth daily.    [name] Pratt  levothyroxine (SYNTHROID, LEVOTHROID) 150 MCG tablet TAKE 1 TABLET ONE TIME DAILY 6 DAYS A WEEK AND 1/2 TABLET 1 DAY A WEEK 78 tablet 1  . losartan-hydrochlorothiazide (HYZAAR) 100-25 MG tablet Take 1 tablet by mouth daily. 90 tablet 2  . multivitamin (THERAGRAN) per tablet Take 1 tablet by mouth daily.      . Omega-3 Fatty Acids (FISH OIL PO) Take 1 capsule by mouth 2 (two) times daily.    [name] Pratt omeprazole (PRILOSEC) 20 MG capsule Take 1 capsule (20 mg total) by mouth every morning. 90 capsule 1  .  pravastatin (PRAVACHOL) 40 MG tablet TAKE 1 TABLET EVERY DAY 90 tablet 1  . sodium chloride (OCEAN) 0.65 % nasal spray Place 1 spray into both nostrils at bedtime as needed for congestion.    [name] Pratt acetaminophen (TYLENOL) 500 MG tablet Take 500 mg by mouth every 6 (six) hours as needed for moderate pain.    . budesonide-formoterol (SYMBICORT) 80-4.5 MCG/ACT inhaler Inhale 2 puffs into the lungs 2 (two) times daily. 2 Inhaler 0  . ondansetron (ZOFRAN ODT) 8 MG disintegrating tablet Take 1 tablet (8 mg total) by mouth every 8 (eight) hours as needed for nausea or vomiting. 20 tablet 0  . potassium chloride (K-DUR) 10 MEQ tablet 1 tab po q day 4 tablet 0   Facility-Administered Medications Prior to Visit  Medication Dose Route Frequency Provider Last Rate Last Dose  . cloNIDine (CATAPRES) tablet 0.1 mg  0.1 mg Oral Once [name], NP        PAST MEDICAL HISTORY: Past Medical History:  Diagnosis Date  . Arthritis   . Cancer ([place])    skin cancer  . Carotid stenosis    a. Carotid U/S 4/40: LICA < 34%, RICA 74-25%;  b.  Carotid U/S 9/56:  RICA 38-75%; LICA 6-43%; f/u 1 year  . Chest pain     Myoview in 2008 was normal.  Echo 7/09: EF 60%, normal wall motion, mild LVH, mild LAE.  [name] Pratt Complication of anesthesia    "stomach does not wake up"  . COPD (chronic obstructive pulmonary disease) ([place])   . GERD (gastroesophageal reflux disease)   . Gout   . History of chicken pox   . History of hemorrhoids    . History of kidney stones   . Hyperlipidemia    under control  . Hypertension    under control  . Hypothyroidism   . Ileus ([place])   . Inguinal hernia   . OSA (obstructive sleep apnea)   . Pancreatitis   . PONV (postoperative nausea and vomiting)   . Rosacea     PAST SURGICAL HISTORY: Past Surgical History:  Procedure Laterality Date  . ABDOMINAL HERNIA REPAIR  07/15/12   Dr [name]  . BROW LIFT  05/07/01  . CYSTOSCOPY WITH URETEROSCOPY AND STENT PLACEMENT Right 01/28/2014   Procedure: CYSTOSCOPY WITH URETEROSCOPY, BASKET RETRIVAL AND  STENT PLACEMENT;  Surgeon: [name], MD;  Location: [facility];  Service: Urology;  Laterality: Right;  . epidural injections     multiple procedures  . [place]   multiple times  . EYE SURGERY Bilateral 03/25/10, 2012   cataract removal  . HEMORRHOID SURGERY  2014  . HIATAL HERNIA REPAIR  12/21/93  . HOLMIUM LASER APPLICATION Right 12/08/5186   Procedure: HOLMIUM LASER APPLICATION;  Surgeon: [name], MD;  [facility];  Service: Urology;  Laterality: Right;  . INGUINAL HERNIA REPAIR Right 07/15/12   Dr [name], x2  . JOINT REPLACEMENT  07/25/04   right knee  . JOINT REPLACEMENT  07/13/10   left knee  . KNEE SURGERY  1995  . LAPAROSCOPIC CHOLECYSTECTOMY  09/20/06   with intraoperative cholangiogram and right inguinal herniorrhaphy with mesh   . LIGAMENT REPAIR Left 07/2013   shoulder  . LITHOTRIPSY     x2  . NASAL SEPTUM SURGERY  1975  . REFRACTIVE SURGERY Bilateral   . 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 SKIN CANCER DESTRUCTION     nose, ear  . TONSILLECTOMY  as child  . TOTAL HIP ARTHROPLASTY Left   . URETHRAL DILATION  1991   [name]    FAMILY HISTORY: Family History  Problem Relation Age of Onset  . Cancer Mother   . Hypertension Unknown   . Cancer Unknown   . Osteoarthritis Unknown     SOCIAL HISTORY: Social History   Social History  . Marital status: Married    Spouse name: N/A    . Number of children: 1  . Years of education: N/A   Occupational History  . firefighter     paints on the side   Social History Main Topics  . Smoking status: Former Smoker    Years: 11.00    Types: Cigarettes    Quit date: 09/11/1978  . Smokeless tobacco: Never Used  . Alcohol use 0.0 oz/week     Comment: rare  . Drug use: No  . Sexual activity: Not on file   Other Topics Concern  . Not on file   Social History Narrative   Lives with his wife   He has one son- lives locally.   2 grandchildren   Has worked for Research officer, trade union   Enjoys wood working   Completed HS.  [employer]         PHYSICAL EXAM  Vitals:   03/06/17 1002  BP: (!) 143/69  Pulse: 60  Weight: 174 lb 8 oz (79.2 kg)  Height: 5\' 7"  (1.702 m)   Body mass index is 27.33 kg/m.  Generalized: Well developed, in no acute distress  Neck: Neck circumference 16.5 inches, Mallampati 2+ No wheezing.  Dry cough, non productive, dry mouth , sinus congestion.   Neurological examination  Mentation: Alert oriented to time, place, history taking. Follows all commands speech and language fluent Cranial nerve II-XII: Taste and smell are affected- he attributes this to medication.  Ischemic retinal disease. Pupils were equal round reactive to light. 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 Facial sensation and strength were normal. Uvula tongue midline. Head turning and shoulder shrug  were normal and symmetric. Motor:  symmetric motor tone is noted throughout.  Sensory: intact to soft touch on all 4 extremities. 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 Coordination: Cerebellar testing reveals good finger-nose-finger and heel-to-shin bilaterally.  Gait and station: Gait is normal.  Reflexes: Deep tendon reflexes are symmetric and normal bilaterally.   DIAGNOSTIC DATA (LABS, IMAGING, TESTING) - I reviewed patient records, labs, notes, testing and imaging myself where  available.  Lab Results  Component Value Date   WBC 6.6 12/19/2016   HGB 14.1 12/19/2016   HCT 42.1 12/19/2016   MCV 96.9 12/19/2016   PLT 370.0 12/19/2016      Component Value Date/Time   NA 139 12/19/2016 1155   K 3.8 12/19/2016 1155   CL 102 12/19/2016 1155   CO2 28 12/19/2016 1155   GLUCOSE 109 (H) 12/19/2016 1155   BUN 30 (H) 12/19/2016 1155   CREATININE 1.47 12/19/2016 1155   CREATININE 1.44 (H) 09/15/2015 0959   CALCIUM 9.7 12/19/2016 1155   PROT 8.0 11/30/2016 1604   ALBUMIN 4.5 11/30/2016 1604   AST 26 11/30/2016 1604   ALT 24 11/30/2016 1604   ALKPHOS 59 11/30/2016 1604   BILITOT 0.4 11/30/2016 1604   GFRNONAA 42 (L) 08/27/2015 1120   GFRNONAA 73 11/25/2012 1103   GFRAA 48 (L) 08/27/2015 1120   GFRAA 84 11/25/2012 1103   Lab Results  Component Value  Date   CHOL 127 12/19/2016   HDL 31.90 (L) 12/19/2016   LDLCALC 70 12/19/2016   TRIG 124.0 12/19/2016   CHOLHDL 4 12/19/2016   Lab Results  Component Value Date   HGBA1C 6.2 12/19/2016   Lab Results  Component Value Date   VITAMINB12 334 06/19/2016   Lab Results  Component Value Date   TSH 1.18 12/19/2016      ASSESSMENT AND PLAN 76 y.o. year old male  has a past medical history of Arthritis; Cancer ([place]); Carotid stenosis; Chest pain; Complication of anesthesia; COPD (chronic obstructive pulmonary disease) ([place]); GERD (gastroesophageal reflux disease); Gout; History of chicken pox; History of hemorrhoids; History of kidney stones; Hyperlipidemia; Hypertension; Hypothyroidism; Ileus ([place]); Inguinal hernia; OSA (obstructive sleep apnea); Pancreatitis; PONV (postoperative nausea and vomiting); and Rosacea. here with:  1. Complex-mosly Obstructive sleep apnea on CPAP,  Elevated residual AHI. Has still felt better since using CPAP. He had central apneas at higher pressures. Stays at 12 mc water .   2. Abducens palsy- ideopathic, followed by  Narda Amber, DO   3. 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 CEA not planned yet. Dr.  Johnsie Cancel   Overall the patient is doing well. He has 100% compliance with the CPAP.  He has slightly elevated AHI that has been his baseline. We will make no adjustments today. we have tried CPAP  auto titration 7-12 cm water in January 2018 and his AHI was  5.7 at a 95% at 12 cm water. He will follow-up in 12 months with me, Dr. Stefan Church, MD  03/06/2017, 10:33 AM [facility] [address] [place], [place] [address] [phone]

## 2017-03-24 ENCOUNTER — Other Ambulatory Visit: Payer: Self-pay | Admitting: Family

## 2017-04-02 ENCOUNTER — Telehealth: Payer: Self-pay | Admitting: Family

## 2017-04-02 DIAGNOSIS — I6529 Occlusion and stenosis of unspecified carotid artery: Secondary | ICD-10-CM

## 2017-04-02 NOTE — Telephone Encounter (Signed)
Pt called in to see if he need a referral for a Cardiovascular imaging with Dr. Johnsie Cancel at [facility]? If so pt would like to have placed.

## 2017-04-02 NOTE — Telephone Encounter (Signed)
Referral placed.

## 2017-04-02 NOTE — Telephone Encounter (Signed)
Please advise    PC 

## 2017-04-09 DIAGNOSIS — G4733 Obstructive sleep apnea (adult) (pediatric): Secondary | ICD-10-CM | POA: Diagnosis not present

## 2017-04-25 ENCOUNTER — Ambulatory Visit (HOSPITAL_COMMUNITY)
Admission: RE | Admit: 2017-04-25 | Discharge: 2017-04-25 | Disposition: A | Discharge status: Home / Self Care | Payer: Medicare HMO | Source: Ambulatory Visit | Attending: Cardiovascular Disease | Admitting: Cardiovascular Disease

## 2017-04-25 DIAGNOSIS — I6523 Occlusion and stenosis of bilateral carotid arteries: Secondary | ICD-10-CM | POA: Diagnosis not present

## 2017-05-01 DIAGNOSIS — I10 Essential (primary) hypertension: Secondary | ICD-10-CM | POA: Diagnosis not present

## 2017-05-01 DIAGNOSIS — E119 Type 2 diabetes mellitus without complications: Secondary | ICD-10-CM | POA: Diagnosis not present

## 2017-05-01 DIAGNOSIS — H35343 Macular cyst, hole, or pseudohole, bilateral: Secondary | ICD-10-CM | POA: Diagnosis not present

## 2017-05-01 DIAGNOSIS — Z961 Presence of intraocular lens: Secondary | ICD-10-CM | POA: Diagnosis not present

## 2017-05-02 DIAGNOSIS — Z85828 Personal history of other malignant neoplasm of skin: Secondary | ICD-10-CM | POA: Diagnosis not present

## 2017-05-02 DIAGNOSIS — Z08 Encounter for follow-up examination after completed treatment for malignant neoplasm: Secondary | ICD-10-CM | POA: Diagnosis not present

## 2017-05-02 DIAGNOSIS — L570 Actinic keratosis: Secondary | ICD-10-CM | POA: Diagnosis not present

## 2017-05-16 ENCOUNTER — Other Ambulatory Visit: Payer: Self-pay | Admitting: Family

## 2017-06-20 ENCOUNTER — Encounter: Payer: Self-pay | Admitting: Family

## 2017-06-20 ENCOUNTER — Ambulatory Visit (INDEPENDENT_AMBULATORY_CARE_PROVIDER_SITE_OTHER): Payer: Medicare HMO | Admitting: Family

## 2017-06-20 VITALS — BP 144/60 | HR 73 | Temp 97.8°F | Resp 18 | Ht 67.0 in | Wt 172.9 lb

## 2017-06-20 DIAGNOSIS — I10 Essential (primary) hypertension: Secondary | ICD-10-CM | POA: Diagnosis not present

## 2017-06-20 DIAGNOSIS — M1A9XX0 Chronic gout, unspecified, without tophus (tophi): Secondary | ICD-10-CM

## 2017-06-20 DIAGNOSIS — E785 Hyperlipidemia, unspecified: Secondary | ICD-10-CM

## 2017-06-20 DIAGNOSIS — Z23 Encounter for immunization: Secondary | ICD-10-CM | POA: Diagnosis not present

## 2017-06-20 DIAGNOSIS — E039 Hypothyroidism, unspecified: Secondary | ICD-10-CM | POA: Diagnosis not present

## 2017-06-20 DIAGNOSIS — K219 Gastro-esophageal reflux disease without esophagitis: Secondary | ICD-10-CM | POA: Diagnosis not present

## 2017-06-20 DIAGNOSIS — N529 Male erectile dysfunction, unspecified: Secondary | ICD-10-CM | POA: Insufficient documentation

## 2017-06-20 DIAGNOSIS — E1040 Type 1 diabetes mellitus with diabetic neuropathy, unspecified: Secondary | ICD-10-CM

## 2017-06-20 DIAGNOSIS — G6289 Other specified polyneuropathies: Secondary | ICD-10-CM

## 2017-06-20 HISTORY — DX: Male erectile dysfunction, unspecified: N52.9

## 2017-06-20 LAB — BASIC METABOLIC PANEL
BUN: 40 mg/dL — ABNORMAL HIGH (ref 6–23)
CO2: 27 mEq/L (ref 19–32)
Calcium: 9.9 mg/dL (ref 8.4–10.5)
Chloride: 104 mEq/L (ref 96–112)
Creatinine, Ser: 1.57 mg/dL — ABNORMAL HIGH (ref 0.40–1.50)
GFR: 45.72 mL/min — AB (ref >60.00)
Glucose, Bld: 148 mg/dL — ABNORMAL HIGH (ref 70–99)
POTASSIUM: 4.3 meq/L (ref 3.5–5.1)
SODIUM: 141 meq/L (ref 135–145)

## 2017-06-20 LAB — URIC ACID: URIC ACID, SERUM: 6.3 mg/dL (ref 4.0–7.8)

## 2017-06-20 LAB — TSH: TSH: 0.37 u[IU]/mL (ref 0.35–4.50)

## 2017-06-20 LAB — HEMOGLOBIN A1C: HEMOGLOBIN A1C: 5.9 % (ref 4.6–6.5)

## 2017-06-20 MED ORDER — SILDENAFIL CITRATE 20 MG PO TABS: ORAL_TABLET | ORAL | 0 refills | Status: DC

## 2017-06-20 NOTE — Assessment & Plan Note (Signed)
Stable on allopurinol check uric acid level.

## 2017-06-20 NOTE — Assessment & Plan Note (Signed)
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 Denies back pain or radicular symptoms. Does not wish to add new med. I don't think he would tolerate increased dose of gabapentin.  Will monitor for now.

## 2017-06-20 NOTE — Assessment & Plan Note (Signed)
Trial of generic sildenafil

## 2017-06-20 NOTE — Progress Notes (Signed)
Subjective:    Patient ID: [name] Pratt, male    DOB: [birth year], 76 y.o.   MRN: [id number]  HPI  [name] Pratt is a 76 yr old male who presents today for follow up.  1) DM2-  Lab Results  Component Value Date   HGBA1C 6.2 12/19/2016   HGBA1C 6.1 11/24/2015   HGBA1C 5.8 03/22/2015   Lab Results  Component Value Date   LDLCALC 70 12/19/2016   CREATININE 1.47 12/19/2016   2) HTN- maintained on amlodipine, hydralazine.  BP Readings from Last 3 Encounters:  06/20/17 (!) 144/60  03/06/17 (!) 143/69  02/23/17 140/84   3) Hyperlipidemia- maintained on pravastatin/lopid Lab Results  Component Value Date   CHOL 127 12/19/2016   HDL 31.90 (L) 12/19/2016   LDLCALC 70 12/19/2016   TRIG 124.0 12/19/2016   CHOLHDL 4 12/19/2016   4) Hypothyroid- maintained on syntrhoid 150 mcg. Lab Results  Component Value Date   TSH 1.18 12/19/2016   5) Numbness- reports bilateral foot numbness and burning/pressure in is legs. He is using gabapentin. Symptoms improve with walking.    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 Reports that this has been present x >1 year.    7) GERD- maintained on omeprazole 20mg . Reports symptoms stable.    8) Gout- on allopurinol.   Review of Systems See HPI  Past Medical History:  Diagnosis Date  . Arthritis   . Cancer ([place])    skin cancer  . Carotid stenosis    a. Carotid U/S 8/52: LICA < 77%, RICA 82-42%;  b.  Carotid U/S 3/53:  RICA 61-44%; LICA 3-15%; f/u 1 year  . Chest pain     Myoview in 2008 was normal.  Echo 7/09: EF 60%, normal wall motion, mild LVH, mild LAE.  [name] Complication of anesthesia    "stomach does not wake up"  . COPD (chronic obstructive pulmonary disease) ([place])   . GERD (gastroesophageal reflux disease)   . Gout   . History of chicken pox   . History of hemorrhoids   . History of kidney stones   . Hyperlipidemia    under control  . Hypertension    under control  . Hypothyroidism   . Ileus ([place])   .  Inguinal hernia   . OSA (obstructive sleep apnea)   . Pancreatitis   . PONV (postoperative nausea and vomiting)   . Rosacea      Social History   Social History  . Marital status: Married    Spouse name: N/A  . Number of children: 1  . Years of education: N/A   Occupational History  . firefighter     paints on the side   Social History Main Topics  . Smoking status: Former Smoker    Years: 11.00    Types: Cigarettes    Quit date: 09/11/1978  . Smokeless tobacco: Never Used  . Alcohol use 0.0 oz/week     Comment: rare  . Drug use: No  . Sexual activity: Not on file   Other Topics Concern  . Not on file   Social History Narrative   Lives with his wife   He has one son- lives locally.   2 grandchildren   Has worked for Research officer, trade union   Enjoys wood working   Completed HS.  [employer]       Past Surgical History:  Procedure Laterality Date  . ABDOMINAL HERNIA REPAIR  07/15/12   Dr [name]  . BROW  LIFT  05/07/01  . CYSTOSCOPY WITH URETEROSCOPY AND STENT PLACEMENT Right 01/28/2014   Procedure: CYSTOSCOPY WITH URETEROSCOPY, BASKET RETRIVAL AND  STENT PLACEMENT;  Surgeon: [name], MD;  Location[facility];  Service: Urology;  Laterality: Right;  . epidural injections     multiple procedures  . [place]   multiple times  . EYE SURGERY Bilateral 03/25/10, 2012   cataract removal  . HEMORRHOID SURGERY  2014  . HIATAL HERNIA REPAIR  12/21/93  . HOLMIUM LASER APPLICATION Right 2/84/1324   Procedure: HOLMIUM LASER APPLICATION;  Surgeon: [name], MD;  [facility];  Service: Urology;  Laterality: Right;  . INGUINAL HERNIA REPAIR Right 07/15/12   Dr [name], x2  . JOINT REPLACEMENT  07/25/04   right knee  . JOINT REPLACEMENT  07/13/10   left knee  . KNEE SURGERY  1995  . LAPAROSCOPIC CHOLECYSTECTOMY  09/20/06   with intraoperative cholangiogram and right inguinal herniorrhaphy with mesh   . LIGAMENT REPAIR Left 07/2013   shoulder    . LITHOTRIPSY     x2  . NASAL SEPTUM SURGERY  1975  . REFRACTIVE SURGERY Bilateral   . 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 SKIN CANCER DESTRUCTION     nose, ear  . TONSILLECTOMY  as child  . TOTAL HIP ARTHROPLASTY Left   . URETHRAL DILATION  1991   [name]    Family History  Problem Relation Age of Onset  . Cancer Mother   . Hypertension Unknown   . Cancer Unknown   . Osteoarthritis Unknown     Allergies  Allergen Reactions  . Metoclopramide Hcl     anxiety    Current Outpatient Prescriptions on File Prior to Visit  Medication Sig Dispense Refill  . albuterol (PROVENTIL HFA;VENTOLIN HFA) 108 (90 Base) MCG/ACT inhaler Inhale 2 puffs into the lungs every 6 (six) hours as needed for shortness of breath. 3 Inhaler 1  . allopurinol (ZYLOPRIM) 100 MG tablet TAKE 1 TABLET (100 MG TOTAL) BY MOUTH DAILY. 90 tablet 1  . allopurinol (ZYLOPRIM) 300 MG tablet TAKE 1 TABLET ONE TIME DAILY 90 tablet 1  . amLODipine (NORVASC) 5 MG tablet TAKE 1 TABLET EVERY DAY 90 tablet 1  . aspirin EC 81 MG tablet Take 81 mg by mouth every morning.    . Carboxymethylcellul-Glycerin (OPTIVE) 0.5-0.9 % SOLN Apply 1 drop to eye daily.    . cycloSPORINE (RESTASIS) 0.05 % ophthalmic emulsion Place 1 drop into both eyes 2 (two) times daily.    . diclofenac sodium (VOLTAREN) 1 % GEL Apply 1 application topically daily as needed (arthritis). 100 g 2  . diphenhydrAMINE (BENADRYL) 25 MG tablet Take 25 mg by mouth every 6 (six) hours as needed for allergies.    [name] gabapentin (NEURONTIN) 300 MG capsule Take 1 capsule (300 mg total) by mouth at bedtime. 90 capsule 1  . gemfibrozil (LOPID) 600 MG tablet TAKE 1/2 TABLET TWICE DAILY BEFORE MEALS 90 tablet 1  . hydrALAZINE (APRESOLINE) 50 MG tablet TAKE 1 TABLET THREE TIMES DAILY 270 tablet 1  . IRON PO Take 50 mcg by mouth daily.    [name] levothyroxine (SYNTHROID, LEVOTHROID) 150 MCG tablet TAKE 1 TABLET ONE TIME DAILY 6 DAYS A WEEK AND 1/2 TABLET 1 DAY A WEEK 78 tablet  1  . losartan-hydrochlorothiazide (HYZAAR) 100-25 MG tablet Take 1 tablet by mouth daily. 90 tablet 2  . multivitamin (THERAGRAN) per tablet Take 1 tablet  by mouth daily.      . Omega-3 Fatty Acids (FISH OIL PO) Take 1 capsule by mouth 2 (two) times daily.    [name] omeprazole (PRILOSEC) 20 MG capsule TAKE 1 CAPSULE EVERY MORNING 90 capsule 1  . pravastatin (PRAVACHOL) 40 MG tablet TAKE 1 TABLET EVERY DAY 90 tablet 1  . sodium chloride (OCEAN) 0.65 % nasal spray Place 1 spray into both nostrils at bedtime as needed for congestion.     Current Facility-Administered Medications on File Prior to Visit  Medication Dose Route Frequency Provider Last Rate Last Dose  . cloNIDine (CATAPRES) tablet 0.1 mg  0.1 mg Oral Once [name], NP        BP (!) 144/60 (BP Location: Right Arm, Cuff Size: Normal)   Pulse 73   Temp 97.8 F (36.6 C) (Oral)   Resp 18   Ht 5\' 7"  (1.702 m)   Wt 172 lb 14.4 oz (78.4 kg)   SpO2 97%   BMI 27.08 kg/m       Objective:   Physical Exam  Constitutional: He is oriented to person, place, and time. He appears well-developed and well-nourished. No distress.  HENT:  Head: Normocephalic and atraumatic.  Cardiovascular: Normal rate and regular rhythm.   No murmur heard. Pulmonary/Chest: Effort normal and breath sounds normal. No respiratory distress. He has no wheezes. He has no rales.  Musculoskeletal: He exhibits no edema.  Neurological: He is alert and oriented to person, place, and time.  Skin: Skin is warm and dry.  Psychiatric: He has a normal mood and affect. His behavior is normal. Thought content normal.          Assessment & Plan:

## 2017-06-20 NOTE — Assessment & Plan Note (Signed)
At goal, continue statin.

## 2017-06-20 NOTE — Assessment & Plan Note (Signed)
Stable, obtain follow up tsh, continue synthroid.

## 2017-06-20 NOTE — Assessment & Plan Note (Signed)
BP is acceptable. Continue current meds.

## 2017-06-20 NOTE — Addendum Note (Signed)
Addended by: Kelle Darting A on: 06/20/2017 01:20 PM   Modules accepted: Orders

## 2017-06-20 NOTE — Patient Instructions (Addendum)
Try coming off of omeprazole.   Complete lab work prior to leaving.  You can have generic viagra filled at either:  [facility] [place], [place] [address]  [phone]   Or   [facility] [place] [place], [place] [phone]

## 2017-06-20 NOTE — Assessment & Plan Note (Signed)
>>  ASSESSMENT AND PLAN FOR BENIGN ESSENTIAL HYPERTENSION WRITTEN ON 06/20/2017 11:05 AM BY O'SULLIVAN, Niaomi Cartaya, NP  BP is acceptable. Continue current meds.

## 2017-06-20 NOTE — Assessment & Plan Note (Signed)
Stable, trial off of PPI.

## 2017-07-10 ENCOUNTER — Other Ambulatory Visit: Payer: Self-pay | Admitting: Family

## 2017-07-10 ENCOUNTER — Other Ambulatory Visit: Payer: Self-pay | Admitting: Cardiovascular Disease

## 2017-07-19 ENCOUNTER — Encounter: Payer: Self-pay | Admitting: Family

## 2017-07-19 DIAGNOSIS — H35072 Retinal telangiectasis, left eye: Secondary | ICD-10-CM | POA: Diagnosis not present

## 2017-07-19 DIAGNOSIS — H3561 Retinal hemorrhage, right eye: Secondary | ICD-10-CM | POA: Diagnosis not present

## 2017-07-19 DIAGNOSIS — H35073 Retinal telangiectasis, bilateral: Secondary | ICD-10-CM | POA: Diagnosis not present

## 2017-07-19 DIAGNOSIS — H35352 Cystoid macular degeneration, left eye: Secondary | ICD-10-CM | POA: Diagnosis not present

## 2017-07-19 DIAGNOSIS — H35071 Retinal telangiectasis, right eye: Secondary | ICD-10-CM | POA: Diagnosis not present

## 2017-07-27 ENCOUNTER — Other Ambulatory Visit: Payer: Self-pay | Admitting: Family

## 2017-09-01 ENCOUNTER — Other Ambulatory Visit: Payer: Self-pay | Admitting: Family

## 2017-09-21 ENCOUNTER — Ambulatory Visit: Payer: Self-pay

## 2017-09-21 ENCOUNTER — Telehealth: Payer: Self-pay | Admitting: *Deleted

## 2017-09-21 ENCOUNTER — Telehealth: Payer: Self-pay | Admitting: Family

## 2017-09-21 ENCOUNTER — Ambulatory Visit (INDEPENDENT_AMBULATORY_CARE_PROVIDER_SITE_OTHER): Payer: Medicare HMO | Admitting: Family

## 2017-09-21 ENCOUNTER — Encounter: Payer: Self-pay | Admitting: Family

## 2017-09-21 VITALS — BP 178/70 | HR 53 | Temp 97.8°F | Resp 16 | Ht 67.0 in | Wt 173.4 lb

## 2017-09-21 DIAGNOSIS — I10 Essential (primary) hypertension: Secondary | ICD-10-CM

## 2017-09-21 DIAGNOSIS — E785 Hyperlipidemia, unspecified: Secondary | ICD-10-CM | POA: Diagnosis not present

## 2017-09-21 DIAGNOSIS — G6281 Critical illness polyneuropathy: Secondary | ICD-10-CM | POA: Diagnosis not present

## 2017-09-21 DIAGNOSIS — R252 Cramp and spasm: Secondary | ICD-10-CM | POA: Diagnosis not present

## 2017-09-21 DIAGNOSIS — M109 Gout, unspecified: Secondary | ICD-10-CM

## 2017-09-21 DIAGNOSIS — N529 Male erectile dysfunction, unspecified: Secondary | ICD-10-CM

## 2017-09-21 DIAGNOSIS — K219 Gastro-esophageal reflux disease without esophagitis: Secondary | ICD-10-CM

## 2017-09-21 LAB — MAGNESIUM: MAGNESIUM: 2.2 mg/dL (ref 1.5–2.5)

## 2017-09-21 LAB — LIPID PANEL
Cholesterol: 122 mg/dL (ref 0–200)
HDL: 33.8 mg/dL — ABNORMAL LOW (ref >39.00)
LDL Cholesterol: 65 mg/dL (ref 0–99)
NONHDL: 88.44
Total CHOL/HDL Ratio: 4
Triglycerides: 115 mg/dL (ref 0.0–149.0)
VLDL: 23 mg/dL (ref 0.0–40.0)

## 2017-09-21 LAB — BASIC METABOLIC PANEL
BUN: 31 mg/dL — AB (ref 6–23)
CALCIUM: 9.5 mg/dL (ref 8.4–10.5)
CO2: 28 mEq/L (ref 19–32)
Chloride: 101 mEq/L (ref 96–112)
Creatinine, Ser: 1.52 mg/dL — ABNORMAL HIGH (ref 0.40–1.50)
GFR: 47.43 mL/min — AB (ref >60.00)
GLUCOSE: 111 mg/dL — AB (ref 70–99)
Potassium: 4 mEq/L (ref 3.5–5.1)
SODIUM: 138 meq/L (ref 135–145)

## 2017-09-21 MED ORDER — LEVOTHYROXINE SODIUM 150 MCG PO TABS: ORAL_TABLET | ORAL | 1 refills | Status: DC

## 2017-09-21 MED ORDER — ALLOPURINOL 300 MG PO TABS: ORAL_TABLET | ORAL | 1 refills | Status: DC

## 2017-09-21 MED ORDER — AMLODIPINE BESYLATE 10 MG PO TABS: 10.0000 mg | ORAL_TABLET | Freq: Every day | ORAL | 1 refills | Status: DC

## 2017-09-21 MED ORDER — AMLODIPINE BESYLATE 10 MG PO TABS: 10.0000 mg | ORAL_TABLET | Freq: Every day | ORAL | 3 refills | Status: DC

## 2017-09-21 MED ORDER — ALLOPURINOL 100 MG PO TABS: 100.0000 mg | ORAL_TABLET | Freq: Every day | ORAL | 1 refills | Status: DC

## 2017-09-21 NOTE — Telephone Encounter (Signed)
Reviewed record and flu shot was given on 06/20/17. Notified pt. He requests that Rx sent to mail order for new dose of amlodipine be cancelled and sent to [facility] until he knows if dose is sufficient. Faxed cancellation request to [facility] mail order and re-sent 30 day rx to [facility]. Pt aware.

## 2017-09-21 NOTE — Telephone Encounter (Signed)
Attempted to return his call regarding getting a flu shot but got a busy signal.

## 2017-09-21 NOTE — Telephone Encounter (Signed)
Received fax from [facility] requesting refills of Levothyroxine 155mcg and allopurinol 100mg . Pt is taking 300mg  of allopurinol as well and it will need additional refill also. Refills sent on all 3 Rxs.

## 2017-09-21 NOTE — Progress Notes (Signed)
Subjective:    Patient ID: [name] Pratt, male    DOB: [birth year], 77 y.o.   MRN: [id number]  HPI  [name] Pratt is a 77 yr old male who presents today for follow up.  1) GERD- last visit we recommended trial off of PPI.  Reports that his symptoms have been well controlled off omeprazole.   2) HTN- reports good compliance with bp meds.   BP Readings from Last 3 Encounters:  09/21/17 (!) 168/75  06/20/17 (!) 144/60  03/06/17 (!) 143/69   2) ED- [place] generic viagra.   Reports that it did not help, caused palpitations.    3) Peripheral neuropathy- reports that he has some nighttime cramps.  Improves with walking. Denies restless leg syndrome.   4) Hypothyroid-  Lab Results  Component Value Date   TSH 0.37 06/20/2017   5) ED- had elevated heart rate on sildenafil.    6) Gout- maintained on allopurinol. Denies recent gout symptoms.   7) hyperlipidemia- maintained on pravastatin.  Lab Results  Component Value Date   CHOL 127 12/19/2016   HDL 31.90 (L) 12/19/2016   LDLCALC 70 12/19/2016   TRIG 124.0 12/19/2016   CHOLHDL 4 12/19/2016     Review of Systems See HPI  Past Medical History:  Diagnosis Date  . Arthritis   . Cancer ([place])    skin cancer  . Carotid stenosis    a. Carotid U/S 1/94: LICA < 17%, RICA 40-81%;  b.  Carotid U/S 4/48:  RICA 18-56%; LICA 3-14%; f/u 1 year  . Chest pain     Myoview in 2008 was normal.  Echo 7/09: EF 60%, normal wall motion, mild LVH, mild LAE.  [name] Complication of anesthesia    "stomach does not wake up"  . COPD (chronic obstructive pulmonary disease) ([place])   . GERD (gastroesophageal reflux disease)   . Gout   . History of chicken pox   . History of hemorrhoids   . History of kidney stones   . Hyperlipidemia    under control  . Hypertension    under control  . Hypothyroidism   . Ileus ([place])   . Inguinal hernia   . OSA (obstructive sleep apnea)   . Pancreatitis   . PONV (postoperative nausea and vomiting)   . Rosacea        Social History   Socioeconomic History  . Marital status: Married    Spouse name: Not on file  . Number of children: 1  . Years of education: Not on file  . Highest education level: Not on file  Social Needs  . Financial resource strain: Not on file  . Food insecurity - worry: Not on file  . Food insecurity - inability: Not on file  . Transportation needs - medical: Not on file  . Transportation needs - non-medical: Not on file  Occupational History  . 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 Smoking status: Former Smoker    Years: 11.00    Types: Cigarettes    Last attempt to quit: 09/11/1978    Years since quitting: 39.0  . Smokeless tobacco: Never Used  Substance and Sexual Activity  . Alcohol use: Yes    Alcohol/week: 0.0 oz    Comment: rare  . Drug use: No  . Sexual activity: Not on file  Other Topics Concern  . Not on file  Social History Narrative   Lives with his wife  He has one son- lives locally.   2 grandchildren   Has worked for [employer], trade union   Enjoys wood working   Completed HS.  [employer]    Past Surgical History:  Procedure Laterality Date  . ABDOMINAL HERNIA REPAIR  07/15/12   Dr [name]  . BROW LIFT  05/07/01  . CYSTOSCOPY WITH URETEROSCOPY AND STENT PLACEMENT Right 01/28/2014   Procedure: CYSTOSCOPY WITH URETEROSCOPY, BASKET RETRIVAL AND  STENT PLACEMENT;  Surgeon: [name], MD;  Location: [facility];  Service: Urology;  Laterality: Right;  . epidural injections     multiple procedures  . [place]   multiple times  . EYE SURGERY Bilateral 03/25/10, 2012   cataract removal  . HEMORRHOID SURGERY  2014  . HIATAL HERNIA REPAIR  12/21/93  . HOLMIUM LASER APPLICATION Right 0/16/0109   Procedure: HOLMIUM LASER APPLICATION;  Surgeon: [name], MD;  Location: [facility];  Service: Urology;  Laterality: Right;  . INGUINAL HERNIA REPAIR Right 07/15/12   Dr [name], x2  . JOINT REPLACEMENT   07/25/04   right knee  . JOINT REPLACEMENT  07/13/10   left knee  . KNEE SURGERY  1995  . LAPAROSCOPIC CHOLECYSTECTOMY  09/20/06   with intraoperative cholangiogram and right inguinal herniorrhaphy with mesh   . LIGAMENT REPAIR Left 07/2013   shoulder  . LITHOTRIPSY     x2  . NASAL SEPTUM SURGERY  1975  . REFRACTIVE SURGERY Bilateral   . 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 SKIN CANCER DESTRUCTION     nose, ear  . TONSILLECTOMY  as child  . TOTAL HIP ARTHROPLASTY Left   . URETHRAL DILATION  1991   [name]    Family History  Problem Relation Age of Onset  . Cancer Mother   . Hypertension Unknown   . Cancer Unknown   . Osteoarthritis Unknown     Allergies  Allergen Reactions  . Metoclopramide Hcl     anxiety  . Sildenafil     Tachycardia      Current Outpatient Medications on File Prior to Visit  Medication Sig Dispense Refill  . albuterol (PROVENTIL HFA;VENTOLIN HFA) 108 (90 Base) MCG/ACT inhaler Inhale 2 puffs into the lungs every 6 (six) hours as needed for shortness of breath. 3 Inhaler 1  . allopurinol (ZYLOPRIM) 100 MG tablet TAKE 1 TABLET (100 MG TOTAL) BY MOUTH DAILY. 90 tablet 1  . allopurinol (ZYLOPRIM) 300 MG tablet TAKE 1 TABLET ONE TIME DAILY 90 tablet 1  . amLODipine (NORVASC) 5 MG tablet TAKE 1 TABLET EVERY DAY 90 tablet 1  . aspirin EC 81 MG tablet Take 81 mg by mouth every morning.    . Carboxymethylcellul-Glycerin (OPTIVE) 0.5-0.9 % SOLN Apply 1 drop to eye daily.    . cycloSPORINE (RESTASIS) 0.05 % ophthalmic emulsion Place 1 drop into both eyes 2 (two) times daily.    . diclofenac sodium (VOLTAREN) 1 % GEL Apply 1 application topically daily as needed (arthritis). 100 g 2  . diphenhydrAMINE (BENADRYL) 25 MG tablet Take 25 mg by mouth every 6 (six) hours as needed for allergies.    [name] gabapentin (NEURONTIN) 300 MG capsule Take 1 capsule (300 mg total) by mouth at bedtime. 90 capsule 0  . gemfibrozil (LOPID) 600 MG tablet TAKE 1/2 TABLET TWICE  DAILY BEFORE MEALS 90 tablet 1  . hydrALAZINE (APRESOLINE) 50 MG tablet TAKE 1 TABLET THREE TIMES DAILY 270 tablet 1  . IRON  PO Take 50 mcg by mouth daily.    [name] levothyroxine (SYNTHROID, LEVOTHROID) 150 MCG tablet TAKE 1 TABLET ONE TIME DAILY 6 DAYS A WEEK AND 1/2 TABLET 1 DAY A WEEK 78 tablet 1  . losartan-hydrochlorothiazide (HYZAAR) 100-25 MG tablet TAKE 1 TABLET EVERY DAY 90 tablet 2  . multivitamin (THERAGRAN) per tablet Take 1 tablet by mouth daily.      . Omega-3 Fatty Acids (FISH OIL PO) Take 1 capsule by mouth 2 (two) times daily.    . pravastatin (PRAVACHOL) 40 MG tablet TAKE 1 TABLET EVERY DAY 90 tablet 1  . sodium chloride (OCEAN) 0.65 % nasal spray Place 1 spray into both nostrils at bedtime as needed for congestion.     Current Facility-Administered Medications on File Prior to Visit  Medication Dose Route Frequency Provider Last Rate Last Dose  . cloNIDine (CATAPRES) tablet 0.1 mg  0.1 mg Oral Once [name], NP        BP (!) 168/75 (BP Location: Left Arm, Cuff Size: Normal)   Pulse (!) 53   Temp 97.8 F (36.6 C) (Oral)   Resp 16   Ht 5\' 7"  (1.702 m)   Wt 173 lb 6.4 oz (78.7 kg)   SpO2 98%   BMI 27.16 kg/m       Objective:   Physical Exam  Constitutional: He is oriented to person, place, and time. He appears well-developed and well-nourished. No distress.  HENT:  Head: Normocephalic and atraumatic.  Cardiovascular: Normal rate and regular rhythm.  No murmur heard. Pulmonary/Chest: Effort normal and breath sounds normal. No respiratory distress. He has no wheezes. He has no rales.  Musculoskeletal: He exhibits no edema.  Neurological: He is alert and oriented to person, place, and time.  Skin: Skin is warm and dry.  Psychiatric: He has a normal mood and affect. His behavior is normal. Thought content normal.          Assessment & Plan:

## 2017-09-21 NOTE — Addendum Note (Signed)
Addended by: Kelle Darting A on: 09/21/2017 03:21 PM   Modules accepted: Orders

## 2017-09-21 NOTE — Telephone Encounter (Signed)
Reported he was in for an office appt. Today and questioned if he should have had a flu vaccine.  Stated he read from the After Visit Summary at his previous appt., that he would be due for flu vaccine.  noted from office note today, he will need a 4 week f/u.  Appt. scheduled for 10/19/17.  Will send message to the provider re: need for flu vaccine prior to appt. 2/8.  Pt. Agrees with plan.       Answer Assessment - Initial Assessment Questions 1. REASON FOR CALL or QUESTION: "What is your reason for calling today?" or "How can I best help you?" or "What question do you have that I can help answer?"     Pt. is asking about need for Flu vaccine.  Reported he saw on his AVS 3 mos. Ago, that flu vaccine was recommended at his 3 mo. F/u.  Protocols used: INFORMATION ONLY CALL-A-AH

## 2017-09-21 NOTE — Patient Instructions (Addendum)
Please complete lab work prior to leaving. Drink plenty of water and stretch your calf muscles every night before bed to help prevent cramping.   Increase amlodipine form 5mg  to 10mg .

## 2017-09-24 DIAGNOSIS — G4733 Obstructive sleep apnea (adult) (pediatric): Secondary | ICD-10-CM | POA: Diagnosis not present

## 2017-09-24 NOTE — Assessment & Plan Note (Signed)
Stable on gabapentin. Continue same.  °

## 2017-09-24 NOTE — Telephone Encounter (Signed)
Pt called in to follow up on request. Advised pt of message below.

## 2017-09-24 NOTE — Assessment & Plan Note (Signed)
Stable off PPI, continue to monitor.

## 2017-09-24 NOTE — Assessment & Plan Note (Signed)
Tolerating statin.  LDL at goal, continue same.   Lab Results  Component Value Date   CHOL 122 09/21/2017   HDL 33.80 (L) 09/21/2017   LDLCALC 65 09/21/2017   TRIG 115.0 09/21/2017   CHOLHDL 4 09/21/2017

## 2017-09-24 NOTE — Assessment & Plan Note (Signed)
Stable on allopurinol, continue same.  

## 2017-09-24 NOTE — Assessment & Plan Note (Signed)
>>  ASSESSMENT AND PLAN FOR BENIGN ESSENTIAL HYPERTENSION WRITTEN ON 09/24/2017  9:43 AM BY O'SULLIVAN, Anokhi Shannon, NP  Uncontrolled. Will increase amlodipine from 5mg  to 10mg .

## 2017-09-24 NOTE — Assessment & Plan Note (Signed)
No improvement with revatio- discussed referral to urology- he declines as he reports that this is not a priority for him and his wife right now.

## 2017-09-24 NOTE — Assessment & Plan Note (Signed)
Uncontrolled. Will increase amlodipine from 5mg to 10mg.  

## 2017-10-19 ENCOUNTER — Encounter: Payer: Self-pay | Admitting: Family

## 2017-10-19 ENCOUNTER — Ambulatory Visit (INDEPENDENT_AMBULATORY_CARE_PROVIDER_SITE_OTHER): Payer: Medicare HMO | Admitting: Family

## 2017-10-19 VITALS — BP 144/65 | HR 65 | Temp 97.7°F | Resp 16 | Ht 67.0 in | Wt 174.2 lb

## 2017-10-19 DIAGNOSIS — I10 Essential (primary) hypertension: Secondary | ICD-10-CM | POA: Diagnosis not present

## 2017-10-19 MED ORDER — AMLODIPINE BESYLATE 10 MG PO TABS: 10.0000 mg | ORAL_TABLET | Freq: Every day | ORAL | 1 refills | Status: DC

## 2017-10-19 NOTE — Patient Instructions (Signed)
Please continue current dose of amlodipine.  °

## 2017-10-19 NOTE — Progress Notes (Signed)
Subjective:    Patient ID: [name] Pratt, male    DOB: 10-22-1940, 77 y.o.   MRN: [id number]  HPI  Patient is a 77 year old male who presents today for follow-up of his hypertension.  Last visit blood pressure was noted to be uncontrolled.  We increase his amlodipine dosing from 5 mg to 10 mg once daily. Reports tolerating without side effects. Denies LE edema.  BP Readings from Last 3 Encounters:  10/19/17 (!) 144/65  09/21/17 (!) 178/70  06/20/17 (!) 144/60       Review of Systems    see HPI  Past Medical History:  Diagnosis Date  . Arthritis   . Cancer ([place])    skin cancer  . Carotid stenosis    a. Carotid U/S 3/66: LICA < 29%, RICA 47-65%;  b.  Carotid U/S 4/65:  RICA 03-54%; LICA 6-56%; f/u 1 year  . Chest pain     Myoview in 2008 was normal.  Echo 7/09: EF 60%, normal wall motion, mild LVH, mild LAE.  [name] Complication of anesthesia    "stomach does not wake up"  . COPD (chronic obstructive pulmonary disease) ([place])   . GERD (gastroesophageal reflux disease)   . Gout   . History of chicken pox   . History of hemorrhoids   . History of kidney stones   . Hyperlipidemia    under control  . Hypertension    under control  . Hypothyroidism   . Ileus ([place])   . Inguinal hernia   . OSA (obstructive sleep apnea)   . Pancreatitis   . PONV (postoperative nausea and vomiting)   . Rosacea      Social History   Socioeconomic History  . Marital status: Married    Spouse name: Not on file  . Number of children: 1  . Years of education: Not on file  . Highest education level: Not on file  Social Needs  . Financial resource strain: Not on file  . Food insecurity - worry: Not on file  . Food insecurity - inability: Not on file  . Transportation needs - medical: Not on file  . Transportation needs - non-medical: Not on file  Occupational History  . 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 Smoking status: Former Smoker    Years:  11.00    Types: Cigarettes    Last attempt to quit: 09/11/1978    Years since quitting: 39.1  . Smokeless tobacco: Never Used  Substance and Sexual Activity  . Alcohol use: Yes    Alcohol/week: 0.0 oz    Comment: rare  . Drug use: No  . Sexual activity: Not on file  Other Topics Concern  . Not on file  Social History Narrative   Lives with his wife   He has one son- lives locally.   2 grandchildren   Has worked for [employer], trade union   Enjoys wood working   Completed HS.  [employer]    Past Surgical History:  Procedure Laterality Date  . ABDOMINAL HERNIA REPAIR  07/15/12   Dr [name]  . BROW LIFT  05/07/01  . CYSTOSCOPY WITH URETEROSCOPY AND STENT PLACEMENT Right 01/28/2014   Procedure: CYSTOSCOPY WITH URETEROSCOPY, BASKET RETRIVAL AND  STENT PLACEMENT;  Surgeon: [name], MD;  Location: [facility];  Service: Urology;  Laterality: Right;  . epidural injections     multiple procedures  . [place]  multiple times  . EYE SURGERY Bilateral 03/25/10, 2012   cataract removal  . HEMORRHOID SURGERY  2014  . HIATAL HERNIA REPAIR  12/21/93  . HOLMIUM LASER APPLICATION Right 03/20/6268   Procedure: HOLMIUM LASER APPLICATION;  Surgeon: [name], MD;  Location: [facility];  Service: Urology;  Laterality: Right;  . INGUINAL HERNIA REPAIR Right 07/15/12   Dr [name], x2  . JOINT REPLACEMENT  07/25/04   right knee  . JOINT REPLACEMENT  07/13/10   left knee  . KNEE SURGERY  1995  . LAPAROSCOPIC CHOLECYSTECTOMY  09/20/06   with intraoperative cholangiogram and right inguinal herniorrhaphy with mesh   . LIGAMENT REPAIR Left 07/2013   shoulder  . LITHOTRIPSY     x2  . NASAL SEPTUM SURGERY  1975  . REFRACTIVE SURGERY Bilateral   . 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 SKIN CANCER DESTRUCTION     nose, ear  . TONSILLECTOMY  as child  . TOTAL HIP ARTHROPLASTY Left   . URETHRAL DILATION  1991   [name]    Family History  Problem Relation Age of Onset  .  Cancer Mother   . Hypertension Unknown   . Cancer Unknown   . Osteoarthritis Unknown     Allergies  Allergen Reactions  . Metoclopramide Hcl     anxiety  . Sildenafil     Tachycardia      Current Outpatient Medications on File Prior to Visit  Medication Sig Dispense Refill  . albuterol (PROVENTIL HFA;VENTOLIN HFA) 108 (90 Base) MCG/ACT inhaler Inhale 2 puffs into the lungs every 6 (six) hours as needed for shortness of breath. 3 Inhaler 1  . allopurinol (ZYLOPRIM) 100 MG tablet Take 1 tablet (100 mg total) by mouth daily. 90 tablet 1  . allopurinol (ZYLOPRIM) 300 MG tablet TAKE 1 TABLET ONE TIME DAILY 90 tablet 1  . aspirin EC 81 MG tablet Take 81 mg by mouth every morning.    . Carboxymethylcellul-Glycerin (OPTIVE) 0.5-0.9 % SOLN Apply 1 drop to eye daily.    . cycloSPORINE (RESTASIS) 0.05 % ophthalmic emulsion Place 1 drop into both eyes 2 (two) times daily.    . diclofenac sodium (VOLTAREN) 1 % GEL Apply 1 application topically daily as needed (arthritis). 100 g 2  . diphenhydrAMINE (BENADRYL) 25 MG tablet Take 25 mg by mouth every 6 (six) hours as needed for allergies.    [name] gabapentin (NEURONTIN) 300 MG capsule Take 1 capsule (300 mg total) by mouth at bedtime. 90 capsule 0  . gemfibrozil (LOPID) 600 MG tablet TAKE 1/2 TABLET TWICE DAILY BEFORE MEALS 90 tablet 1  . hydrALAZINE (APRESOLINE) 50 MG tablet TAKE 1 TABLET THREE TIMES DAILY 270 tablet 1  . IRON PO Take 50 mcg by mouth daily.    [name] levothyroxine (SYNTHROID, LEVOTHROID) 150 MCG tablet TAKE 1 TABLET ONE TIME DAILY 6 DAYS A WEEK AND 1/2 TABLET 1 DAY A WEEK 78 tablet 1  . losartan-hydrochlorothiazide (HYZAAR) 100-25 MG tablet TAKE 1 TABLET EVERY DAY 90 tablet 2  . multivitamin (THERAGRAN) per tablet Take 1 tablet by mouth daily.      . Omega-3 Fatty Acids (FISH OIL PO) Take 1 capsule by mouth 2 (two) times daily.    . pravastatin (PRAVACHOL) 40 MG tablet TAKE 1 TABLET EVERY DAY 90 tablet 1  . sodium chloride (OCEAN) 0.65 %  nasal spray Place 1 spray into both nostrils at bedtime as needed for congestion.     Current Facility-Administered  Medications on File Prior to Visit  Medication Dose Route Frequency Provider Last Rate Last Dose  . cloNIDine (CATAPRES) tablet 0.1 mg  0.1 mg Oral Once [name], NP        BP (!) 144/65   Pulse 65   Temp 97.7 F (36.5 C) (Oral)   Resp 16   Ht 5\' 7"  (1.702 m)   Wt 174 lb 3.2 oz (79 kg)   SpO2 100%   BMI 27.28 kg/m    Objective:   Physical Exam  Constitutional: He is oriented to person, place, and time. He appears well-developed and well-nourished. No distress.  HENT:  Head: Normocephalic and atraumatic.  Cardiovascular: Normal rate and regular rhythm.  No murmur heard. Pulmonary/Chest: Effort normal and breath sounds normal. No respiratory distress. He has no wheezes. He has no rales.  Musculoskeletal: He exhibits no edema.  Neurological: He is alert and oriented to person, place, and time.  Skin: Skin is warm and dry.  Psychiatric: He has a normal mood and affect. His behavior is normal. Thought content normal.          Assessment & Plan:  HTN- BP improved. Continue current dose of amlodipine. Plan follow up in 6 months.

## 2017-11-26 ENCOUNTER — Telehealth: Payer: Self-pay | Admitting: Family

## 2017-11-26 MED ORDER — GABAPENTIN 300 MG PO CAPS: 300.0000 mg | ORAL_CAPSULE | Freq: Every day | ORAL | 1 refills | Status: DC

## 2017-11-26 NOTE — Telephone Encounter (Signed)
Copied from [place]. Topic: Quick Communication - Rx Refill/Question >> Nov 26, 2017 12:00 PM Stovall, Shana A wrote: Medication: gabapentin (NEURONTIN) 300 MG capsule [[id number]]   Has the patient contacted their pharmacy? No    (Agent: If no, request that the patient contact the pharmacy for the refill.)   Preferred Pharmacy (with phone number or street name): [facility]   Agent: Please be advised that RX refills may take up to 3 business days. We ask that you follow-up with your pharmacy.

## 2017-11-28 DIAGNOSIS — L821 Other seborrheic keratosis: Secondary | ICD-10-CM | POA: Diagnosis not present

## 2017-11-28 DIAGNOSIS — L570 Actinic keratosis: Secondary | ICD-10-CM | POA: Diagnosis not present

## 2017-12-04 ENCOUNTER — Telehealth: Payer: Self-pay | Admitting: Family

## 2017-12-04 NOTE — Telephone Encounter (Signed)
Copied from [place]. Topic: General - Other >> Dec 04, 2017  2:59 PM Darl Householder, RMA wrote: Reason for CRM: Patient is requesting a call concerning a billing question

## 2017-12-26 ENCOUNTER — Telehealth: Payer: Self-pay | Admitting: Family

## 2017-12-26 ENCOUNTER — Other Ambulatory Visit: Payer: Self-pay

## 2017-12-26 ENCOUNTER — Other Ambulatory Visit: Payer: Self-pay | Admitting: Family

## 2017-12-26 MED ORDER — HYDRALAZINE HCL 50 MG PO TABS: 50.0000 mg | ORAL_TABLET | Freq: Three times a day (TID) | ORAL | 0 refills | Status: DC

## 2017-12-26 NOTE — Telephone Encounter (Signed)
Copied from [place] [id number]. Topic: Quick Communication - Rx Refill/Question >> Dec 26, 2017 10:55 AM Scherrie Gerlach wrote: Medication: hydrALAZINE (APRESOLINE) 50 MG tablet Pt states he will not get his mail order in time and will be out of this. Requesting a 30 day sent to local [place], [place] [phone] (Phone) [phone] (Fax)

## 2018-02-12 ENCOUNTER — Other Ambulatory Visit: Payer: Self-pay | Admitting: Family

## 2018-02-12 DIAGNOSIS — E7800 Pure hypercholesterolemia, unspecified: Secondary | ICD-10-CM

## 2018-02-12 DIAGNOSIS — E039 Hypothyroidism, unspecified: Secondary | ICD-10-CM

## 2018-02-12 NOTE — Telephone Encounter (Signed)
Copied from [place] [id number]. Topic: Quick Communication - Rx Refill/Question >> Feb 12, 2018 11:29 AM Oliver Pila B wrote: Medication: levothyroxine (SYNTHROID, LEVOTHROID) 150 MCG tablet [[id number]] , pravastatin (PRAVACHOL) 40 MG tablet [[id number]]   Has the patient contacted their pharmacy? Yes.   (Agent: If no, request that the patient contact the pharmacy for the refill.) (Agent: If yes, when and what did the pharmacy advise?)  Preferred Pharmacy (with phone number or street name): [facility]  Agent: Please be advised that RX refills may take up to 3 business days. We ask that you follow-up with your pharmacy.

## 2018-02-13 MED ORDER — LEVOTHYROXINE SODIUM 150 MCG PO TABS
ORAL_TABLET | ORAL | 1 refills | Status: DC
Start: 2018-02-13 — End: 2018-08-22

## 2018-02-13 MED ORDER — PRAVASTATIN SODIUM 40 MG PO TABS: 40.0000 mg | ORAL_TABLET | Freq: Every day | ORAL | 1 refills | Status: DC

## 2018-02-13 NOTE — Telephone Encounter (Signed)
Refill for levothyroxine 150 mcg and Pravastatin 40 mg  LOV 10/19/17 NOV 04/22/18 [facility]

## 2018-02-19 ENCOUNTER — Telehealth: Payer: Self-pay | Admitting: Family

## 2018-02-19 NOTE — Telephone Encounter (Signed)
Copied from [place] [id number]. Topic: Quick Communication - Rx Refill/Question >> Feb 19, 2018 12:48 PM Wynetta Emery, [place] C wrote: Medication: levothyroxine (SYNTHROID, LEVOTHROID) 150 MCG tablet   Has the patient contacted their pharmacy? No  (Agent: If no, request that the patient contact the pharmacy for the refill.)  (Agent: If yes, when and what did the pharmacy advise?)  Preferred Pharmacy (with phone number or street name): [place], [place] [phone] (Phone) [phone] (Fax)      Agent: Please be advised that RX refills may take up to 3 business days. We ask that you follow-up with your pharmacy.

## 2018-02-20 NOTE — Telephone Encounter (Signed)
Disp Refills Start End   levothyroxine (SYNTHROID, LEVOTHROID) 150 MCG tablet 78 tablet 1 02/13/2018    Sig: TAKE 1 TABLET ONE TIME DAILY 6 DAYS A WEEK AND 1/2 TABLET 1 DAY A WEEK   Sent to pharmacy as: levothyroxine (SYNTHROID, LEVOTHROID) 150 MCG tablet   Notes to Pharmacy: Supervising MD: Penni Homans  DEA: [id number]   E-Prescribing Status: Receipt confirmed by pharmacy (02/13/2018 4:47 PM EDT)    / Medication sent to pharmacy on 02-13-2018

## 2018-03-07 ENCOUNTER — Ambulatory Visit: Payer: Medicare HMO | Admitting: Neurology

## 2018-03-08 NOTE — Progress Notes (Signed)
Patient ID: [name] Pratt, male   DOB: 02/21/1941, 77 y.o.   MRN: [id number]     CARDIOLOGY OFFICE NOTE  Date:  03/08/2018    [name] Pratt Date of Birth: 11/27/1940 Medical Record #[id number]  PCP:  [name], NP  Cardiologist:  Johnsie Cancel    No chief complaint on file.   History of Present Illness:  77 y.o. history of HLD, GERD, left bruit , HTN and COPD. No history of CAD with normal myovue in 2008 and may of 2017. Last TTE done June 2017 with EF 55-60% AV sclerosis Duplex done August of 2018 with new criteria read as plaque no stenosis bilaterally. 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 Arthritis   . Cancer ([place])    skin cancer  . Carotid stenosis    a. Carotid U/S 6/37: LICA < 85%, RICA 88-50%;  b.  Carotid U/S 2/77:  RICA 41-28%; LICA 7-86%; f/u 1 year  . Chest pain     Myoview in 2008 was normal.  Echo 7/09: EF 60%, normal wall motion, mild LVH, mild LAE.  [name] Complication of anesthesia    "stomach does not wake up"  . COPD (chronic obstructive pulmonary disease) ([place])   . GERD (gastroesophageal reflux disease)   . Gout   . History of chicken pox   . History of hemorrhoids   . History of kidney stones   . Hyperlipidemia    under control  . Hypertension    under control  . Hypothyroidism   . Ileus ([place])   . Inguinal hernia   . OSA (obstructive sleep apnea)   . Pancreatitis   . PONV (postoperative nausea and vomiting)   . Rosacea     Past Surgical History:  Procedure Laterality Date  . ABDOMINAL HERNIA REPAIR  07/15/12   Dr [name]  . BROW LIFT  05/07/01  . CYSTOSCOPY WITH URETEROSCOPY AND STENT PLACEMENT Right 01/28/2014   Procedure: CYSTOSCOPY WITH URETEROSCOPY, BASKET RETRIVAL AND  STENT PLACEMENT;  Surgeon: [name],  MD;  Location: [facility];  Service: Urology;  Laterality: Right;  . epidural injections     multiple procedures  . [place]   multiple times  . EYE SURGERY Bilateral 03/25/10, 2012   cataract removal  . HEMORRHOID SURGERY  2014  . HIATAL HERNIA REPAIR  12/21/93  . HOLMIUM LASER APPLICATION Right 7/67/2094   Procedure: HOLMIUM LASER APPLICATION;  Surgeon: [name], MD;  Location: [facility];  Service: Urology;  Laterality: Right;  . INGUINAL HERNIA REPAIR Right 07/15/12   Dr [name], x2  . JOINT REPLACEMENT  07/25/04   right knee  . JOINT REPLACEMENT  07/13/10   left knee  . KNEE SURGERY  1995  . LAPAROSCOPIC CHOLECYSTECTOMY  09/20/06   with intraoperative cholangiogram and right inguinal herniorrhaphy with mesh   . LIGAMENT REPAIR Left 07/2013   shoulder  . LITHOTRIPSY     x2  . NASAL SEPTUM SURGERY  1975  . REFRACTIVE SURGERY Bilateral   . 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 SKIN CANCER DESTRUCTION     nose, ear  . TONSILLECTOMY  as child  . TOTAL HIP ARTHROPLASTY Left   . [place]  [name]     Medications: Current Outpatient Medications  Medication Sig Dispense Refill  . albuterol (PROVENTIL HFA;VENTOLIN HFA) 108 (90 Base) MCG/ACT inhaler Inhale 2 puffs into the lungs every 6 (six) hours as needed for shortness of breath. 3 Inhaler 1  . allopurinol (ZYLOPRIM) 100 MG tablet Take 1 tablet (100 mg total) by mouth daily. 90 tablet 1  . allopurinol (ZYLOPRIM) 300 MG tablet TAKE 1 TABLET ONE TIME DAILY 90 tablet 1  . amLODipine (NORVASC) 10 MG tablet Take 1 tablet (10 mg total) by mouth daily. 90 tablet 1  . aspirin EC 81 MG tablet Take 81 mg by mouth every morning.    . Carboxymethylcellul-Glycerin (OPTIVE) 0.5-0.9 % SOLN Apply 1 drop to eye daily.    . cycloSPORINE (RESTASIS) 0.05 % ophthalmic emulsion Place 1 drop into both eyes 2 (two) times daily.    . diclofenac sodium (VOLTAREN) 1 % GEL Apply 1 application topically daily as  needed (arthritis). 100 g 2  . diphenhydrAMINE (BENADRYL) 25 MG tablet Take 25 mg by mouth every 6 (six) hours as needed for allergies.    [name] gabapentin (NEURONTIN) 300 MG capsule Take 1 capsule (300 mg total) by mouth at bedtime. 90 capsule 1  . gemfibrozil (LOPID) 600 MG tablet TAKE 1/2 TABLET TWICE DAILY BEFORE MEALS 90 tablet 1  . hydrALAZINE (APRESOLINE) 50 MG tablet TAKE 1 TABLET THREE TIMES DAILY 270 tablet 1  . IRON PO Take 50 mcg by mouth daily.    [name] levothyroxine (SYNTHROID, LEVOTHROID) 150 MCG tablet TAKE 1 TABLET ONE TIME DAILY 6 DAYS A WEEK AND 1/2 TABLET 1 DAY A WEEK 78 tablet 1  . losartan-hydrochlorothiazide (HYZAAR) 100-25 MG tablet TAKE 1 TABLET EVERY DAY 90 tablet 2  . multivitamin (THERAGRAN) per tablet Take 1 tablet by mouth daily.      . Omega-3 Fatty Acids (FISH OIL PO) Take 1 capsule by mouth 2 (two) times daily.    . pravastatin (PRAVACHOL) 40 MG tablet Take 1 tablet (40 mg total) by mouth daily. 90 tablet 1  . sodium chloride (OCEAN) 0.65 % nasal spray Place 1 spray into both nostrils at bedtime as needed for congestion.     Current Facility-Administered Medications  Medication Dose Route Frequency Provider Last Rate Last Dose  . cloNIDine (CATAPRES) tablet 0.1 mg  0.1 mg Oral Once [name], NP        Allergies: Allergies  Allergen Reactions  . Metoclopramide Hcl     anxiety  . Sildenafil     Tachycardia      Social History: The patient  reports that he quit smoking about 39 years ago. His smoking use included cigarettes. He quit after 11.00 years of use. He has never used smokeless tobacco. He reports that he drinks alcohol. He reports that he does not use drugs.   Family History: The patient's family history includes Cancer in his mother and unknown relative; Hypertension in his unknown relative; Osteoarthritis in his unknown relative.   Review of Systems: Please see the history of present illness.   Otherwise, the review of systems is  positive for none.   All other systems are reviewed and negative.   Physical Exam: VS:  There were no vitals taken for this visit. [name]  BMI There is no height or weight on file to calculate BMI.  Wt Readings from Last 3 Encounters:  10/19/17 174 lb 3.2 oz (79 kg)  09/21/17 173 lb 6.4 oz (78.7 kg)  06/20/17 172  lb 14.4 oz (78.4 kg)   There were no vitals taken for this visit.   Affect appropriate Healthy:  appears stated age 74: normal Neck supple with no adenopathy JVP normal left bruits no thyromegaly Lungs clear with no wheezing and good diaphragmatic motion Heart:  S1/S2 no murmur, no rub, gallop or click PMI normal Abdomen: benighn, BS positve, no tenderness, no AAA no bruit.  No HSM or HJR Distal pulses intact with no bruits No edema Neuro non-focal Skin warm and dry No muscular weakness Post bilateral TKR     LABORATORY DATA:  EKG:  03/11/18 SR rate 65 RBBB LAFB LVH   Lab Results  Component Value Date   WBC 6.6 12/19/2016   HGB 14.1 12/19/2016   HCT 42.1 12/19/2016   PLT 370.0 12/19/2016   GLUCOSE 111 (H) 09/21/2017   CHOL 122 09/21/2017   TRIG 115.0 09/21/2017   HDL 33.80 (L) 09/21/2017   LDLCALC 65 09/21/2017   ALT 24 11/30/2016   AST 26 11/30/2016   NA 138 09/21/2017   K 4.0 09/21/2017   CL 101 09/21/2017   CREATININE 1.52 (H) 09/21/2017   BUN 31 (H) 09/21/2017   CO2 28 09/21/2017   TSH 0.37 06/20/2017   PSA 0.31 05/26/2013   INR 1.06 12/11/2012   HGBA1C 5.9 06/20/2017    BNP (last 3 results) No results for input(s): BNP in the last 8760 hours.  ProBNP (last 3 results) No results for input(s): PROBNP in the last 8760 hours.   Other Studies Reviewed Today:   Assessment/Plan: 1. HTN -  Improved on current meds his BP cuff inaccurate adjust time of day taking meds Low sodium diet  2. HLD - on statin therapy.   3. Carotid disease -  05/26/16 Duplex 40-59%  By new criteria 04/25/17 plaque no stenosis   4. COPD with stable dyspnea.   5.  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  The patient does not have concerns regarding medicines other than what has been noted above.  The following changes have been made:  See above.  Labs/ tests ordered today include:  -   No orders of the defined types were placed in this encounter.    Disposition:      F.u with me in a year    Jenkins Rouge

## 2018-03-11 ENCOUNTER — Ambulatory Visit: Payer: Medicare HMO | Admitting: Cardiovascular Disease

## 2018-03-11 ENCOUNTER — Encounter: Payer: Self-pay | Admitting: Cardiovascular Disease

## 2018-03-11 VITALS — BP 150/80 | HR 69 | Ht 67.0 in | Wt 174.5 lb

## 2018-03-11 DIAGNOSIS — R079 Chest pain, unspecified: Secondary | ICD-10-CM | POA: Diagnosis not present

## 2018-03-11 DIAGNOSIS — I739 Peripheral vascular disease, unspecified: Secondary | ICD-10-CM

## 2018-03-11 DIAGNOSIS — I779 Disorder of arteries and arterioles, unspecified: Secondary | ICD-10-CM

## 2018-03-11 DIAGNOSIS — I10 Essential (primary) hypertension: Secondary | ICD-10-CM | POA: Diagnosis not present

## 2018-03-11 DIAGNOSIS — E785 Hyperlipidemia, unspecified: Secondary | ICD-10-CM

## 2018-03-11 MED ORDER — NITROGLYCERIN 0.4 MG SL SUBL: 0.4000 mg | SUBLINGUAL_TABLET | SUBLINGUAL | 3 refills | Status: DC | PRN

## 2018-03-11 NOTE — Patient Instructions (Addendum)
Medication Instructions:  Your physician has recommended you make the following change in your medication:  1-Take 1 Nitrogyclerin 0.4 mg, under your tongue, while sitting for chest pain. If no relief of pain may repeat NTG, one tab every 5 minutes up to 3 tablets total over 15 minutes. If no relief CALL 911. If you have dizziness/lightheadness while taking NTG, stop taking and call 911.  Labwork: NONE  Testing/Procedures: NONE  Follow-Up: Your physician wants you to follow-up in: 12 months with Dr. Johnsie Cancel. You will receive a reminder letter in the mail two months in advance. If you don't receive a letter, please call our office to schedule the follow-up appointment.   If you need a refill on your cardiac medications before your next appointment, please call your pharmacy.

## 2018-03-20 ENCOUNTER — Ambulatory Visit: Payer: Medicare HMO | Admitting: Neurology

## 2018-03-24 ENCOUNTER — Encounter: Payer: Self-pay | Admitting: Neurology

## 2018-03-25 ENCOUNTER — Encounter: Payer: Self-pay | Admitting: Neurology

## 2018-03-25 ENCOUNTER — Ambulatory Visit: Payer: Medicare HMO | Admitting: Neurology

## 2018-03-25 VITALS — BP 156/71 | HR 60 | Ht 67.0 in | Wt 173.0 lb

## 2018-03-25 DIAGNOSIS — G4731 Primary central sleep apnea: Secondary | ICD-10-CM

## 2018-03-25 DIAGNOSIS — G4739 Other sleep apnea: Secondary | ICD-10-CM

## 2018-03-25 DIAGNOSIS — M10049 Idiopathic gout, unspecified hand: Secondary | ICD-10-CM | POA: Diagnosis not present

## 2018-03-25 DIAGNOSIS — Z9989 Dependence on other enabling machines and devices: Secondary | ICD-10-CM

## 2018-03-25 HISTORY — DX: Primary central sleep apnea: G47.31

## 2018-03-25 HISTORY — DX: Other sleep apnea: G47.39

## 2018-03-25 HISTORY — DX: Dependence on other enabling machines and devices: Z99.89

## 2018-03-25 NOTE — Progress Notes (Signed)
PATIENT: [name] Pratt DOB: 1941-06-08  REASON FOR VISIT: follow up HISTORY FROM: patient  HISTORY OF PRESENT ILLNESS:   Interval history from 25 March 2018, I have the pleasure of seeing my longtime established patient [name] today, meanwhile 77 years old.  He has been a very highly compliant CPAP user his CPAP is still set to 12 cmH2O pressure with 3 cm EPR and his apnea index has varied between 5.7 and 10.  The residual apneas are mostly obstructive in nature but he has a history of complex and central apnea. He endorsed the Epworth Sleepiness Scale at 7 points fatigue severity 23 points, geriatric depression score is endorsed at only 1 out of 15 points.  The compliance reports again speaks of 100% compliance with an average use at time of 7 hours and 25 minutes, the settings as mentioned above, the residual apnea index is 8.2 this time was 7.2 of these obstructive apneas.   The apnea index however varies greatly from night to night and may be more dependent on mask fit or sleep position. airleak data a positive for a god seal and fit. The trouble is that the mask shifts when he sleeps on his side. He resumed supine sleep with FFM.  He  works in his garden.   His wife [name] Pratt)  at age 83 recovered well from her MI in May 2018.    Interval history from 03/06/2017, I have the pleasure of seeing [name] today who has again been an excellent compliant CPAP user with 100% compliance and an average user time of 7 hours and 33 minutes, CPAP is set to 12 cm water pressure with 3 cm EPR and his apnea index is 6.4. The residual apneas are mostly obstructive in nature. The patient has a history of complex and central apnea. His January download showed an AHI of 5.7 when he used 12 cm water pressure. I think we do not need to adjust it any further. He still has a low degree of fatigue at only 19 points and an average Epworth sleepiness score of 11 points. He does not  endorse a significant tendency to depression. He has hypothyroidism, controlled HTN, no other causes for EDS. No history of Asthma , but COPD.   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 Wife has OSA and was not compliant with CPAP, just had a heart attack.   Stays at 12 cm water with 3 cm EPR. Elite ResMed machine ( bare bone model )   Interval history from 08/29/2016. [name] Pratt is an established CPAP user, and frequently had high residual AHI eyes. In 2015 his AHI was 13.8 and his pressure on the machine was adjusted to 9 cm water. In 2016 his AHI was 8.5. Today it is back to 13.7 with high compliance he has used the machine 100% over 4 hours nightly, with 7 hours 37 minutes on average, CPAP was set to 11 cm water with 3 cm EPR and the apnea index is 9.8. Mostly obstructive apneas. He does have one point for central apneas per hour of sleep and 8.4 obstructive apneas. I would like for him to be on an auto titrating if possible. I'm not sure his machine which is an S9 Elite can be set this way if not I will ask to adjust the pressure to 11.5 cm a tiny increase. He had tried it once at 13 cm water was highly uncomfortable. He doesn't wake up tired but  he goes to sleep easier. CD  HISTORY 09/02/15: [name] Pratt is a 77 year old male with a history of obstructive sleep apnea. He returns today for a compliance download. At the last visit his pressure settings were adjusted. The patient brought his memory card with him today however we areunable to obtain a download. We will call his DME company to see if they can fax 1 over. The patient reports that he does use the CPAP nightly. He is tolerating the current pressure settings. He states that he typically goes to bed around 11 PM and arises at 7:30 AM. He does wake up during the night however he is able to go right back to sleep.urrently uses the full face mask. He states that he does not have a leak with the mask unless he turns over to his side. At that time  if it does start leaking he can adjust this mass and it creates a better seal. Overall he feels that he is doingwell. His Epworth sleepiness score is 10 and fatigue severity score is 25. He returns today for an evaluation.  HISTORY 09/16/14: [name] Pratt is a 77 year old male with history of obstructive sleep Apnea. He returns today for a 30 daydownload. At the last visit his AHI was 13.8 so his pressure setting was adjusted to 9cm. He brought his machine with him today but we are unable to get a recent download. the patient assures me that his DME company changed his settings and he has been using his machine nightly. Overall he doesn't notice a significant change with the pressure change other than sometimes the pressure is "strong" and can blow the mask off. The patient states that he feels that he is getting good sleep. If he sits down in front of a computer or tv after getting up he may tend to doze off. He still notices some daytime sleepiness. He gets about 7 hours of sleep. He does feel that the CPAP has helped.   ADDEDUM: We were able to get a download from the patient's DME: it showed an AHI of 14.4 on 9 cm H2O with EPR 2. His compliance is 90%. No leak noted. He uses his machine on average 6 hours and 40 minutes.   REVIEW OF SYSTEMS: Out of a complete 14 system review of symptoms, the patient complains only of the following symptoms, and all other reviewed systems are negative.  Fatigue, double vision, hearing loss, shortness of breath, murmur, restless leg, muscle cramps, walking difficulty, neck stiffness, itching, urgency, swollen abdomen, numbness, weakness  Epworth sleepiness score 13 fatigue severity score is 24  ALLERGIES: Allergies  Allergen Reactions  . Metoclopramide Hcl     anxiety  . Sildenafil     Tachycardia      HOME MEDICATIONS: Outpatient Medications Prior to Visit  Medication Sig Dispense Refill  . albuterol (PROVENTIL HFA;VENTOLIN HFA) 108 (90 Base) MCG/ACT  inhaler Inhale 2 puffs into the lungs every 6 (six) hours as needed for shortness of breath. 3 Inhaler 1  . allopurinol (ZYLOPRIM) 100 MG tablet Take 1 tablet (100 mg total) by mouth daily. 90 tablet 1  . allopurinol (ZYLOPRIM) 300 MG tablet TAKE 1 TABLET ONE TIME DAILY 90 tablet 1  . amLODipine (NORVASC) 10 MG tablet Take 1 tablet (10 mg total) by mouth daily. 90 tablet 1  . aspirin EC 81 MG tablet Take 81 mg by mouth every morning.    . Carboxymethylcellul-Glycerin (OPTIVE) 0.5-0.9 % SOLN Apply 1 drop to eye  daily.    . cycloSPORINE (RESTASIS) 0.05 % ophthalmic emulsion Place 1 drop into both eyes 2 (two) times daily.    . diclofenac sodium (VOLTAREN) 1 % GEL Apply 1 application topically daily as needed (arthritis). 100 g 2  . diphenhydrAMINE (BENADRYL) 25 MG tablet Take 25 mg by mouth every 6 (six) hours as needed for allergies.    [name] gabapentin (NEURONTIN) 300 MG capsule Take 1 capsule (300 mg total) by mouth at bedtime. 90 capsule 1  . gemfibrozil (LOPID) 600 MG tablet TAKE 1/2 TABLET TWICE DAILY BEFORE MEALS 90 tablet 1  . hydrALAZINE (APRESOLINE) 50 MG tablet TAKE 1 TABLET THREE TIMES DAILY 270 tablet 1  . IRON PO Take 50 mcg by mouth daily.    [name] levothyroxine (SYNTHROID, LEVOTHROID) 150 MCG tablet TAKE 1 TABLET ONE TIME DAILY 6 DAYS A WEEK AND 1/2 TABLET 1 DAY A WEEK 78 tablet 1  . losartan-hydrochlorothiazide (HYZAAR) 100-25 MG tablet TAKE 1 TABLET EVERY DAY 90 tablet 2  . multivitamin (THERAGRAN) per tablet Take 1 tablet by mouth daily.      . nitroGLYCERIN (NITROSTAT) 0.4 MG SL tablet Place 1 tablet (0.4 mg total) under the tongue every 5 (five) minutes as needed for chest pain. 25 tablet 3  . Omega-3 Fatty Acids (FISH OIL PO) Take 1 capsule by mouth 2 (two) times daily.    . pravastatin (PRAVACHOL) 40 MG tablet Take 1 tablet (40 mg total) by mouth daily. 90 tablet 1  . sodium chloride (OCEAN) 0.65 % nasal spray Place 1 spray into both nostrils at bedtime as needed for congestion.      Facility-Administered Medications Prior to Visit  Medication Dose Route Frequency Provider Last Rate Last Dose  . cloNIDine (CATAPRES) tablet 0.1 mg  0.1 mg Oral Once [name], NP        PAST MEDICAL HISTORY: Past Medical History:  Diagnosis Date  . Arthritis   . Cancer ([place])    skin cancer  . Carotid stenosis    a. Carotid U/S 7/01: LICA < 77%, RICA 93-90%;  b.  Carotid U/S 3/00:  RICA 92-33%; LICA 0-07%; f/u 1 year  . Chest pain     Myoview in 2008 was normal.  Echo 7/09: EF 60%, normal wall motion, mild LVH, mild LAE.  [name] Complication of anesthesia    "stomach does not wake up"  . COPD (chronic obstructive pulmonary disease) ([place])   . GERD (gastroesophageal reflux disease)   . Gout   . History of chicken pox   . History of hemorrhoids   . History of kidney stones   . Hyperlipidemia    under control  . Hypertension    under control  . Hypothyroidism   . Ileus ([place])   . Inguinal hernia   . OSA (obstructive sleep apnea)   . Pancreatitis   . PONV (postoperative nausea and vomiting)   . Rosacea     PAST SURGICAL HISTORY: Past Surgical History:  Procedure Laterality Date  . ABDOMINAL HERNIA REPAIR  07/15/12   Dr [name]  . BROW LIFT  05/07/01  . CYSTOSCOPY WITH URETEROSCOPY AND STENT PLACEMENT Right 01/28/2014   Procedure: CYSTOSCOPY WITH URETEROSCOPY, BASKET RETRIVAL AND  STENT PLACEMENT;  Surgeon: [name], MD;  Location: [facility];  Service: Urology;  Laterality: Right;  . epidural injections     multiple procedures  . [place]   multiple times  . EYE SURGERY Bilateral 03/25/10, 2012  cataract removal  . HEMORRHOID SURGERY  2014  . HIATAL HERNIA REPAIR  12/21/93  . HOLMIUM LASER APPLICATION Right 7/61/9509   Procedure: HOLMIUM LASER APPLICATION;  Surgeon: [name], MD;  Location: [facility];  Service: Urology;  Laterality: Right;  . INGUINAL HERNIA REPAIR Right 07/15/12   Dr [name], x2  . JOINT REPLACEMENT  07/25/04   right  knee  . JOINT REPLACEMENT  07/13/10   left knee  . KNEE SURGERY  1995  . LAPAROSCOPIC CHOLECYSTECTOMY  09/20/06   with intraoperative cholangiogram and right inguinal herniorrhaphy with mesh   . LIGAMENT REPAIR Left 07/2013   shoulder  . LITHOTRIPSY     x2  . NASAL SEPTUM SURGERY  1975  . REFRACTIVE SURGERY Bilateral   . 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 SKIN CANCER DESTRUCTION     nose, ear  . TONSILLECTOMY  as child  . TOTAL HIP ARTHROPLASTY Left   . URETHRAL DILATION  1991   [name]    FAMILY HISTORY: Family History  Problem Relation Age of Onset  . Cancer Mother   . Hypertension Unknown   . Cancer Unknown   . Osteoarthritis Unknown     SOCIAL HISTORY: Social History   Socioeconomic History  . Marital status: Married    Spouse name: Not on file  . Number of children: 1  . Years of education: Not on file  . Highest education level: Not on file  Occupational History  . 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 Financial resource strain: Not on file  . Food insecurity:    Worry: Not on file    Inability: Not on file  . Transportation needs:    Medical: Not on file    Non-medical: Not on file  Tobacco Use  . Smoking status: Former Smoker    Years: 11.00    Types: Cigarettes    Last attempt to quit: 09/11/1978    Years since quitting: 39.5  . Smokeless tobacco: Never Used  Substance and Sexual Activity  . Alcohol use: Yes    Alcohol/week: 0.0 oz    Comment: rare  . Drug use: No  . Sexual activity: Not on file  Lifestyle  . Physical activity:    Days per week: Not on file    Minutes per session: Not on file  . Stress: Not on file  Relationships  . Social connections:    Talks on phone: Not on file    Gets together: Not on file    Attends religious service: Not on file    Active member of club or organization: Not on file    Attends meetings of clubs or organizations: Not on file    Relationship status: Not on file   . Intimate partner violence:    Fear of current or ex partner: Not on file    Emotionally abused: Not on file    Physically abused: Not on file    Forced sexual activity: Not on file  Other Topics Concern  . Not on file  Social History Narrative   Lives with his wife   He has one son- lives locally.   2 grandchildren   Has worked for Research officer, trade union   Enjoys wood working   Completed HS.  [employer]      PHYSICAL EXAM  Vitals:   03/25/18 1029  BP: (!) 156/71  Pulse: 60  Weight: 173 lb (78.5 kg)  Height: 5\' 7"  (1.702 m)   Body mass index is 27.1 kg/m.  Generalized: Well developed, in no acute distress  Neck: Neck circumference 16.5 inches, Mallampati 2+ No wheezing.  Dry cough, non productive, dry mouth , sinus congestion.   Neurological examination  Mentation: Alert oriented to time, place, history taking. Follows all commands speech and language fluent Cranial nerve II-XII: Taste and smell are affected- he attributes this to medication.  Ischemic retinal disease. Pupils were equal round reactive to light. 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 Facial sensation and strength were normal. Uvula tongue midline. Head turning and shoulder shrug  were normal and symmetric. Motor:  symmetric motor tone is noted throughout.  Sensory: intact to soft touch on all 4 extremities. 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 Coordination: Cerebellar testing reveals good finger-nose-finger and heel-to-shin bilaterally.  Gait and station: Gait is normal.  Reflexes: Deep tendon reflexes are symmetric and normal bilaterally.   DIAGNOSTIC DATA (LABS, IMAGING, TESTING) - I reviewed patient records, labs, notes, testing and imaging myself where available.  Lab Results  Component Value Date   WBC 6.6 12/19/2016   HGB 14.1 12/19/2016   HCT 42.1 12/19/2016   MCV 96.9 12/19/2016   PLT 370.0 12/19/2016      Component Value Date/Time   NA 138  09/21/2017 1050   K 4.0 09/21/2017 1050   CL 101 09/21/2017 1050   CO2 28 09/21/2017 1050   GLUCOSE 111 (H) 09/21/2017 1050   BUN 31 (H) 09/21/2017 1050   CREATININE 1.52 (H) 09/21/2017 1050   CREATININE 1.44 (H) 09/15/2015 0959   CALCIUM 9.5 09/21/2017 1050   PROT 8.0 11/30/2016 1604   ALBUMIN 4.5 11/30/2016 1604   AST 26 11/30/2016 1604   ALT 24 11/30/2016 1604   ALKPHOS 59 11/30/2016 1604   BILITOT 0.4 11/30/2016 1604   GFRNONAA 42 (L) 08/27/2015 1120   GFRNONAA 73 11/25/2012 1103   GFRAA 48 (L) 08/27/2015 1120   GFRAA 84 11/25/2012 1103   Lab Results  Component Value Date   CHOL 122 09/21/2017   HDL 33.80 (L) 09/21/2017   LDLCALC 65 09/21/2017   TRIG 115.0 09/21/2017   CHOLHDL 4 09/21/2017   Lab Results  Component Value Date   HGBA1C 5.9 06/20/2017   Lab Results  Component Value Date   VITAMINB12 334 06/19/2016   Lab Results  Component Value Date   TSH 0.37 06/20/2017      ASSESSMENT AND PLAN 77 y.o. year old male  has a past medical history of Arthritis, Cancer ([place]), Carotid stenosis, Chest pain, Complication of anesthesia, COPD (chronic obstructive pulmonary disease) ([place]), GERD (gastroesophageal reflux disease), Gout, History of chicken pox, History of hemorrhoids, History of kidney stones, Hyperlipidemia, Hypertension, Hypothyroidism, Ileus ([place]), Inguinal hernia, OSA (obstructive sleep apnea), Pancreatitis, PONV (postoperative nausea and vomiting), and Rosacea. here with:  1. Complex-mosly Obstructive sleep apnea on CPAP,  Elevated residual AHI persisted . Has still felt better since using CPAP at 12 cm water, I would have likes to increase pressure- he lost weight. . He had central apneas at higher pressures. Stays at 12 mc water with 3 cm EPR and FFM, sleeps supine . Uses a body pillow .   2. Abducens palsy- ideopathic, followed by Narda Amber, DO   3. 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 CEA not planned yet. Dr.  Johnsie Cancel has followed  him every 2 years. Last weeks reports was good.  Overall the patient is doing well. He has 100% compliance with the CPAP.  He has a further elevated AHI that has been his baseline. We will make no adjustments today.  We have tried CPAP auto titration 7-12 cm water in January 2018 and his AHI was  5.7 at a 95% at 12 cm water.  He will follow-up in 12 months with me, Dr. Stefan Church, MD  03/25/2018, 10:58 AM [facility] [address], [place], [place] [address] [phone]

## 2018-03-25 NOTE — Patient Instructions (Signed)

## 2018-04-02 ENCOUNTER — Other Ambulatory Visit: Payer: Self-pay | Admitting: Family

## 2018-04-02 NOTE — Telephone Encounter (Signed)
Copied from [place] [id number]. Topic: Quick Communication - Rx Refill/Question >> Apr 02, 2018 11:21 AM Lysle Morales L, NT wrote: Medication: allopurinol (ZYLOPRIM) 100 MG tablet  and also  allopurinol (ZYLOPRIM) 300 MG tablet   Has the patient contacted their pharmacy? no (Agent: If no, request that the patient contact the pharmacy for the refill (Agent: If yes, when and what did the pharmacy advise?)  Preferred Pharmacy (with phone number or street name [place], [place] [phone] (Phone) [phone] (Fax)      Agent: Please be advised that RX refills may take up to 3 business days. We ask that you follow-up with your pharmacy.

## 2018-04-03 MED ORDER — ALLOPURINOL 300 MG PO TABS: ORAL_TABLET | ORAL | 1 refills | Status: DC

## 2018-04-03 MED ORDER — ALLOPURINOL 100 MG PO TABS: 100.0000 mg | ORAL_TABLET | Freq: Every day | ORAL | 1 refills | Status: DC

## 2018-04-21 NOTE — Progress Notes (Signed)
Subjective:    Patient ID: [name] Pratt, male    DOB: [birth year], 77 y.o.   MRN: [id number]  HPI  Patient is a 77 yr old male who presents today for routine follow up.  HTN- maintained on amlodipine, hydralazine, hyzaar.  BP Readings from Last 3 Encounters:  04/22/18 133/60  03/25/18 (!) 156/71  03/11/18 (!) 150/80   Carotid stenosis- had [place] ordered by cardiology last summer which noted the following:   Heterogeneous plaque with shadowing, bilaterally. Stable 1-39% bilateral ICA stenosis. >50% LECA stenosis.  Normal subclavian arteries, bilaterally. Patent vertebral arteries with antegrade flow. f/u PRN   Hyperlipidemia-  Maintained on lopid,  Pravachol. Lab Results  Component Value Date   CHOL 122 09/21/2017   HDL 33.80 (L) 09/21/2017   LDLCALC 65 09/21/2017   TRIG 115.0 09/21/2017   CHOLHDL 4 09/21/2017   ? Does he want a psa    Gout- maintained on allopurinol. He denies any recent gout flares.    OSA- continues cpap. Reports that he gets 7 hours a night.  Good compliance.   Lab Results  Component Value Date   TSH 0.37 06/20/2017   Lab Results  Component Value Date   HGBA1C 5.9 06/20/2017        Review of Systems See HPI  Past Medical History:  Diagnosis Date  . Arthritis   . Cancer ([place])    skin cancer  . Carotid stenosis    a. Carotid U/S 9/44: LICA < 96%, RICA 75-91%;  b.  Carotid U/S 6/38:  RICA 46-65%; LICA 9-93%; f/u 1 year  . Chest pain     Myoview in 2008 was normal.  Echo 7/09: EF 60%, normal wall motion, mild LVH, mild LAE.  [name] Complication of anesthesia    "stomach does not wake up"  . COPD (chronic obstructive pulmonary disease) ([place])   . GERD (gastroesophageal reflux disease)   . Gout   . History of chicken pox   . History of hemorrhoids   . History of kidney stones   . Hyperlipidemia    under control  . Hypertension    under control  . Hypothyroidism   . Ileus ([place])   . Inguinal hernia   . OSA (obstructive sleep  apnea)   . Pancreatitis   . PONV (postoperative nausea and vomiting)   . Rosacea      Social History   Socioeconomic History  . Marital status: Married    Spouse name: Not on file  . Number of children: 1  . Years of education: Not on file  . Highest education level: Not on file  Occupational History  . 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 Financial resource strain: Not on file  . Food insecurity:    Worry: Not on file    Inability: Not on file  . Transportation needs:    Medical: Not on file    Non-medical: Not on file  Tobacco Use  . Smoking status: Former Smoker    Years: 11.00    Types: Cigarettes    Last attempt to quit: 09/11/1978    Years since quitting: 39.6  . Smokeless tobacco: Never Used  Substance and Sexual Activity  . Alcohol use: Yes    Alcohol/week: 0.0 standard drinks    Comment: rare  . Drug use: No  . Sexual activity: Not on file  Lifestyle  . Physical activity:    Days per week: Not  on file    Minutes per session: Not on file  . Stress: Not on file  Relationships  . Social connections:    Talks on phone: Not on file    Gets together: Not on file    Attends religious service: Not on file    Active member of club or organization: Not on file    Attends meetings of clubs or organizations: Not on file    Relationship status: Not on file  . Intimate partner violence:    Fear of current or ex partner: Not on file    Emotionally abused: Not on file    Physically abused: Not on file    Forced sexual activity: Not on file  Other Topics Concern  . Not on file  Social History Narrative   Lives with his wife   He has one son- lives locally.   2 grandchildren   Has worked for [employer], trade [employer]   Enjoys wood working   Completed HS.  [employer]    Past Surgical History:  Procedure Laterality Date  . ABDOMINAL HERNIA REPAIR  07/15/12   Dr [name]  . BROW LIFT  05/07/01  . CYSTOSCOPY WITH URETEROSCOPY AND STENT  PLACEMENT Right 01/28/2014   Procedure: CYSTOSCOPY WITH URETEROSCOPY, BASKET RETRIVAL AND  STENT PLACEMENT;  Surgeon: [name], MD;  Location: [facility];  Service: Urology;  Laterality: Right;  . epidural injections     multiple procedures  . [place]   multiple times  . EYE SURGERY Bilateral 03/25/10, 2012   cataract removal  . HEMORRHOID SURGERY  2014  . HIATAL HERNIA REPAIR  12/21/93  . HOLMIUM LASER APPLICATION Right 3/71/0626   Procedure: HOLMIUM LASER APPLICATION;  Surgeon: [name], MD;  Location: [facility];  Service: Urology;  Laterality: Right;  . INGUINAL HERNIA REPAIR Right 07/15/12   Dr [name], x2  . JOINT REPLACEMENT  07/25/04   right knee  . JOINT REPLACEMENT  07/13/10   left knee  . KNEE SURGERY  1995  . LAPAROSCOPIC CHOLECYSTECTOMY  09/20/06   with intraoperative cholangiogram and right inguinal herniorrhaphy with mesh   . LIGAMENT REPAIR Left 07/2013   shoulder  . LITHOTRIPSY     x2  . NASAL SEPTUM SURGERY  1975  . REFRACTIVE SURGERY Bilateral   . 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 SKIN CANCER DESTRUCTION     nose, ear  . TONSILLECTOMY  as child  . TOTAL HIP ARTHROPLASTY Left   . URETHRAL DILATION  1991   [name]    Family History  Problem Relation Age of Onset  . Cancer Mother   . Hypertension Unknown   . Cancer Unknown   . Osteoarthritis Unknown     Allergies  Allergen Reactions  . Metoclopramide Hcl     anxiety  . Sildenafil     Tachycardia      Current Outpatient Medications on File Prior to Visit  Medication Sig Dispense Refill  . albuterol (PROVENTIL HFA;VENTOLIN HFA) 108 (90 Base) MCG/ACT inhaler Inhale 2 puffs into the lungs every 6 (six) hours as needed for shortness of breath. 3 Inhaler 1  . allopurinol (ZYLOPRIM) 100 MG tablet Take 1 tablet (100 mg total) by mouth daily. 90 tablet 1  . allopurinol (ZYLOPRIM) 300 MG tablet TAKE 1 TABLET ONE TIME DAILY 90 tablet 1  . amLODipine (NORVASC) 10 MG tablet  Take 1 tablet (10 mg total) by mouth daily. 90 tablet 1  .  aspirin EC 81 MG tablet Take 81 mg by mouth every morning.    . Carboxymethylcellul-Glycerin (OPTIVE) 0.5-0.9 % SOLN Apply 1 drop to eye daily.    . cycloSPORINE (RESTASIS) 0.05 % ophthalmic emulsion Place 1 drop into both eyes 2 (two) times daily.    . diclofenac sodium (VOLTAREN) 1 % GEL Apply 1 application topically daily as needed (arthritis). 100 g 2  . diphenhydrAMINE (BENADRYL) 25 MG tablet Take 25 mg by mouth every 6 (six) hours as needed for allergies.    [name] gabapentin (NEURONTIN) 300 MG capsule Take 1 capsule (300 mg total) by mouth at bedtime. 90 capsule 1  . gemfibrozil (LOPID) 600 MG tablet TAKE 1/2 TABLET TWICE DAILY BEFORE MEALS 90 tablet 1  . hydrALAZINE (APRESOLINE) 50 MG tablet TAKE 1 TABLET THREE TIMES DAILY 270 tablet 1  . IRON PO Take 50 mcg by mouth daily.    [name] levothyroxine (SYNTHROID, LEVOTHROID) 150 MCG tablet TAKE 1 TABLET ONE TIME DAILY 6 DAYS A WEEK AND 1/2 TABLET 1 DAY A WEEK 78 tablet 1  . losartan-hydrochlorothiazide (HYZAAR) 100-25 MG tablet TAKE 1 TABLET EVERY DAY 90 tablet 2  . multivitamin (THERAGRAN) per tablet Take 1 tablet by mouth daily.      . nitroGLYCERIN (NITROSTAT) 0.4 MG SL tablet Place 1 tablet (0.4 mg total) under the tongue every 5 (five) minutes as needed for chest pain. 25 tablet 3  . Omega-3 Fatty Acids (FISH OIL PO) Take 1 capsule by mouth 2 (two) times daily.    . pravastatin (PRAVACHOL) 40 MG tablet Take 1 tablet (40 mg total) by mouth daily. 90 tablet 1  . sodium chloride (OCEAN) 0.65 % nasal spray Place 1 spray into both nostrils at bedtime as needed for congestion.     Current Facility-Administered Medications on File Prior to Visit  Medication Dose Route Frequency Provider Last Rate Last Dose  . cloNIDine (CATAPRES) tablet 0.1 mg  0.1 mg Oral Once [name], NP        BP 133/60 (BP Location: Left Arm, Patient Position: Sitting, Cuff Size: Small)   Pulse 63   Temp 98.1  F (36.7 C) (Oral)   Resp 16   Ht 5\' 7"  (1.702 m)   Wt 173 lb 6.4 oz (78.7 kg)   SpO2 95%   BMI 27.16 kg/m        Objective:   Physical Exam  Constitutional: He is oriented to person, place, and time. He appears well-developed and well-nourished. No distress.  HENT:  Head: Normocephalic and atraumatic.  Cardiovascular: Normal rate, S1 normal and S2 normal. An irregular rhythm present.  No murmur heard. Pulses:      Dorsalis pedis pulses are 2+ on the right side, and 2+ on the left side.  Pulmonary/Chest: Effort normal and breath sounds normal. No respiratory distress. He has no wheezes. He has no rales.  Musculoskeletal: He exhibits no edema.  Neurological: He is alert and oriented to person, place, and time.  Skin: Skin is warm and dry.  Psychiatric: He has a normal mood and affect. His behavior is normal. Thought content normal.           Assessment & Plan:  Hypothyroid- clinically stable on synthroid. Obtain follow up TSH.  Gout- stable, continue allopurinol. Obtain follow up uric acid level.   Hyperglycemia- obtain A1C.  Hyperlipidemia- reports + leg cramps. On statin.  Advised pt as follows:  You can try holding pravastatin for the next 1 week to see if  your leg cramps improve. Send me a message via mychart next week to let me know how you are doing.  Will also order LE arterial duplex.   OSA- some daytime sleepiness- advised pt to follow up with Dr. Brett Fairy to discuss as she is managing cpap.  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 (not new- follows with cardiology)  Some VEC's which likely are contributing to irregular HR on on exam.

## 2018-04-22 ENCOUNTER — Encounter: Payer: Self-pay | Admitting: Family

## 2018-04-22 ENCOUNTER — Ambulatory Visit (INDEPENDENT_AMBULATORY_CARE_PROVIDER_SITE_OTHER): Payer: Medicare HMO | Admitting: Family

## 2018-04-22 VITALS — BP 133/60 | HR 63 | Temp 98.1°F | Resp 16 | Ht 67.0 in | Wt 173.4 lb

## 2018-04-22 DIAGNOSIS — E119 Type 2 diabetes mellitus without complications: Secondary | ICD-10-CM

## 2018-04-22 DIAGNOSIS — E039 Hypothyroidism, unspecified: Secondary | ICD-10-CM | POA: Diagnosis not present

## 2018-04-22 DIAGNOSIS — M1A9XX0 Chronic gout, unspecified, without tophus (tophi): Secondary | ICD-10-CM | POA: Diagnosis not present

## 2018-04-22 DIAGNOSIS — I499 Cardiac arrhythmia, unspecified: Secondary | ICD-10-CM | POA: Diagnosis not present

## 2018-04-22 DIAGNOSIS — Z125 Encounter for screening for malignant neoplasm of prostate: Secondary | ICD-10-CM | POA: Diagnosis not present

## 2018-04-22 DIAGNOSIS — E785 Hyperlipidemia, unspecified: Secondary | ICD-10-CM | POA: Diagnosis not present

## 2018-04-22 DIAGNOSIS — M109 Gout, unspecified: Secondary | ICD-10-CM | POA: Diagnosis not present

## 2018-04-22 DIAGNOSIS — R252 Cramp and spasm: Secondary | ICD-10-CM

## 2018-04-22 NOTE — Patient Instructions (Addendum)
You can try holding pravastatin for the next 1 week to see if your leg cramps improve. Send me a message via mychart next week to let me know how you are doing.  Complete lab work prior to leaving.  You should be contacted about your doppler to check the circulation in your legs. Complete lab work prior to leaving.

## 2018-04-23 LAB — BASIC METABOLIC PANEL
BUN: 33 mg/dL — ABNORMAL HIGH (ref 6–23)
CO2: 30 mEq/L (ref 19–32)
Calcium: 9.6 mg/dL (ref 8.4–10.5)
Chloride: 102 mEq/L (ref 96–112)
Creatinine, Ser: 1.54 mg/dL — ABNORMAL HIGH (ref 0.40–1.50)
GFR: 46.65 mL/min — AB (ref >60.00)
GLUCOSE: 128 mg/dL — AB (ref 70–99)
POTASSIUM: 3.5 meq/L (ref 3.5–5.1)
SODIUM: 140 meq/L (ref 135–145)

## 2018-04-23 LAB — URIC ACID: Uric Acid, Serum: 6 mg/dL (ref 4.0–7.8)

## 2018-04-23 LAB — TSH: TSH: 2.75 u[IU]/mL (ref 0.35–4.50)

## 2018-04-23 LAB — PSA, MEDICARE: PSA: 0.29 ng/ml (ref 0.10–4.00)

## 2018-04-23 LAB — HEMOGLOBIN A1C: Hgb A1c MFr Bld: 6.1 % (ref 4.6–6.5)

## 2018-04-29 ENCOUNTER — Encounter: Payer: Self-pay | Admitting: Family

## 2018-05-02 ENCOUNTER — Ambulatory Visit (HOSPITAL_BASED_OUTPATIENT_CLINIC_OR_DEPARTMENT_OTHER)
Admission: RE | Admit: 2018-05-02 | Discharge: 2018-05-02 | Disposition: A | Discharge status: Home / Self Care | Payer: Medicare HMO | Source: Ambulatory Visit | Attending: Family | Admitting: Family

## 2018-05-02 DIAGNOSIS — R252 Cramp and spasm: Secondary | ICD-10-CM | POA: Insufficient documentation

## 2018-05-02 DIAGNOSIS — I10 Essential (primary) hypertension: Secondary | ICD-10-CM | POA: Insufficient documentation

## 2018-05-02 DIAGNOSIS — E785 Hyperlipidemia, unspecified: Secondary | ICD-10-CM | POA: Diagnosis not present

## 2018-05-02 NOTE — Progress Notes (Signed)
BILATERAL LOWER EXTREMITY ARTERIAL DUPLEX PERFORMED   Normal color filling and velocities and no significant plaque visualized throughout CFA, FA, POP, PTA, ATA.   Left: Normal color filling and velocities and no significant plaque visualized throughout CFA, FA, POP, PTA, ATA.    See table(s) above for measurements and observations.     05/02/18 [name] Pratt

## 2018-05-05 ENCOUNTER — Encounter: Payer: Self-pay | Admitting: Family

## 2018-05-06 ENCOUNTER — Encounter: Payer: Self-pay | Admitting: Family

## 2018-05-06 ENCOUNTER — Telehealth: Payer: Self-pay | Admitting: Family

## 2018-05-06 ENCOUNTER — Ambulatory Visit (INDEPENDENT_AMBULATORY_CARE_PROVIDER_SITE_OTHER): Payer: Medicare HMO | Admitting: Family

## 2018-05-06 VITALS — BP 141/55 | HR 89 | Temp 98.0°F | Resp 16 | Ht 67.0 in | Wt 170.4 lb

## 2018-05-06 DIAGNOSIS — Z85828 Personal history of other malignant neoplasm of skin: Secondary | ICD-10-CM | POA: Diagnosis not present

## 2018-05-06 DIAGNOSIS — E785 Hyperlipidemia, unspecified: Secondary | ICD-10-CM

## 2018-05-06 DIAGNOSIS — L218 Other seborrheic dermatitis: Secondary | ICD-10-CM | POA: Diagnosis not present

## 2018-05-06 DIAGNOSIS — Z08 Encounter for follow-up examination after completed treatment for malignant neoplasm: Secondary | ICD-10-CM | POA: Diagnosis not present

## 2018-05-06 DIAGNOSIS — R609 Edema, unspecified: Secondary | ICD-10-CM

## 2018-05-06 MED ORDER — ALLOPURINOL 100 MG PO TABS: 100.0000 mg | ORAL_TABLET | Freq: Every day | ORAL | 1 refills | Status: DC

## 2018-05-06 MED ORDER — ROSUVASTATIN CALCIUM 5 MG PO TABS: 5.0000 mg | ORAL_TABLET | Freq: Every day | ORAL | 3 refills | Status: DC

## 2018-05-06 MED ORDER — FENOFIBRATE 145 MG PO TABS: 145.0000 mg | ORAL_TABLET | Freq: Every day | ORAL | 5 refills | Status: DC

## 2018-05-06 NOTE — Progress Notes (Signed)
Subjective:    Patient ID: [name] Pratt, male    DOB: August 02, 1941, 77 y.o.   MRN: [id number]  HPI  Patient is a 77 yr old male who presents today for follow up.   Hyperlipidemia- stopped statin due to leg cramps- symptoms improved.   LE arterial duplex was WNL.   Notes LE edema.   Review of Systems Past Medical History:  Diagnosis Date  . Arthritis   . Cancer ([place])    skin cancer  . Carotid stenosis    a. Carotid U/S 4/40: LICA < 10%, RICA 27-25%;  b.  Carotid U/S 3/66:  RICA 44-03%; LICA 4-74%; f/u 1 year  . Chest pain     Myoview in 2008 was normal.  Echo 7/09: EF 60%, normal wall motion, mild LVH, mild LAE.  [name] Complication of anesthesia    "stomach does not wake up"  . COPD (chronic obstructive pulmonary disease) ([place])   . GERD (gastroesophageal reflux disease)   . Gout   . History of chicken pox   . History of hemorrhoids   . History of kidney stones   . Hyperlipidemia    under control  . Hypertension    under control  . Hypothyroidism   . Ileus ([place])   . Inguinal hernia   . OSA (obstructive sleep apnea)   . Pancreatitis   . PONV (postoperative nausea and vomiting)   . Rosacea      Social History   Socioeconomic History  . Marital status: Married    Spouse name: Not on file  . Number of children: 1  . Years of education: Not on file  . Highest education level: Not on file  Occupational History  . 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 Financial resource strain: Not on file  . Food insecurity:    Worry: Not on file    Inability: Not on file  . Transportation needs:    Medical: Not on file    Non-medical: Not on file  Tobacco Use  . Smoking status: Former Smoker    Years: 11.00    Types: Cigarettes    Last attempt to quit: 09/11/1978    Years since quitting: 39.6  . Smokeless tobacco: Never Used  Substance and Sexual Activity  . Alcohol use: Yes    Alcohol/week: 0.0 standard drinks    Comment: rare  .  Drug use: No  . Sexual activity: Not on file  Lifestyle  . Physical activity:    Days per week: Not on file    Minutes per session: Not on file  . Stress: Not on file  Relationships  . Social connections:    Talks on phone: Not on file    Gets together: Not on file    Attends religious service: Not on file    Active member of club or organization: Not on file    Attends meetings of clubs or organizations: Not on file    Relationship status: Not on file  . Intimate partner violence:    Fear of current or ex partner: Not on file    Emotionally abused: Not on file    Physically abused: Not on file    Forced sexual activity: Not on file  Other Topics Concern  . Not on file  Social History Narrative   Lives with his wife   He has one son- lives locally.   2 grandchildren   Has worked for  fire department   Enjoys wood working   Completed HS.  [employer]    Past Surgical History:  Procedure Laterality Date  . ABDOMINAL HERNIA REPAIR  07/15/12   Dr [name]  . BROW LIFT  05/07/01  . CYSTOSCOPY WITH URETEROSCOPY AND STENT PLACEMENT Right 01/28/2014   Procedure: CYSTOSCOPY WITH URETEROSCOPY, BASKET RETRIVAL AND  STENT PLACEMENT;  Surgeon: [name], MD;  Location: [facility];  Service: Urology;  Laterality: Right;  . epidural injections     multiple procedures  . [place]   multiple times  . EYE SURGERY Bilateral 03/25/10, 2012   cataract removal  . HEMORRHOID SURGERY  2014  . HIATAL HERNIA REPAIR  12/21/93  . HOLMIUM LASER APPLICATION Right 6/96/2952   Procedure: HOLMIUM LASER APPLICATION;  Surgeon: [name], MD;  Location: [facility];  Service: Urology;  Laterality: Right;  . INGUINAL HERNIA REPAIR Right 07/15/12   Dr [name], x2  . JOINT REPLACEMENT  07/25/04   right knee  . JOINT REPLACEMENT  07/13/10   left knee  . KNEE SURGERY  1995  . LAPAROSCOPIC CHOLECYSTECTOMY  09/20/06   with intraoperative cholangiogram and right inguinal herniorrhaphy  with mesh   . LIGAMENT REPAIR Left 07/2013   shoulder  . LITHOTRIPSY     x2  . NASAL SEPTUM SURGERY  1975  . REFRACTIVE SURGERY Bilateral   . 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 SKIN CANCER DESTRUCTION     nose, ear  . TONSILLECTOMY  as child  . TOTAL HIP ARTHROPLASTY Left   . URETHRAL DILATION  1991   [name]    Family History  Problem Relation Age of Onset  . Cancer Mother   . Hypertension Unknown   . Cancer Unknown   . Osteoarthritis Unknown     Allergies  Allergen Reactions  . Metoclopramide Hcl     anxiety  . Pravastatin     Muscle cramps  . Sildenafil     Tachycardia      Current Outpatient Medications on File Prior to Visit  Medication Sig Dispense Refill  . albuterol (PROVENTIL HFA;VENTOLIN HFA) 108 (90 Base) MCG/ACT inhaler Inhale 2 puffs into the lungs every 6 (six) hours as needed for shortness of breath. 3 Inhaler 1  . allopurinol (ZYLOPRIM) 100 MG tablet Take 1 tablet (100 mg total) by mouth daily. 90 tablet 1  . allopurinol (ZYLOPRIM) 300 MG tablet TAKE 1 TABLET ONE TIME DAILY 90 tablet 1  . amLODipine (NORVASC) 10 MG tablet Take 1 tablet (10 mg total) by mouth daily. 90 tablet 1  . aspirin EC 81 MG tablet Take 81 mg by mouth every morning.    . Carboxymethylcellul-Glycerin (OPTIVE) 0.5-0.9 % SOLN Apply 1 drop to eye daily.    . cycloSPORINE (RESTASIS) 0.05 % ophthalmic emulsion Place 1 drop into both eyes 2 (two) times daily.    . diclofenac sodium (VOLTAREN) 1 % GEL Apply 1 application topically daily as needed (arthritis). 100 g 2  . diphenhydrAMINE (BENADRYL) 25 MG tablet Take 25 mg by mouth every 6 (six) hours as needed for allergies.    [name] gabapentin (NEURONTIN) 300 MG capsule Take 1 capsule (300 mg total) by mouth at bedtime. 90 capsule 1  . gemfibrozil (LOPID) 600 MG tablet TAKE 1/2 TABLET TWICE DAILY BEFORE MEALS 90 tablet 1  . hydrALAZINE (APRESOLINE) 50 MG tablet TAKE 1 TABLET THREE TIMES DAILY 270 tablet 1  . IRON PO Take 50  mcg by mouth  daily.    [name] levothyroxine (SYNTHROID, LEVOTHROID) 150 MCG tablet TAKE 1 TABLET ONE TIME DAILY 6 DAYS A WEEK AND 1/2 TABLET 1 DAY A WEEK 78 tablet 1  . losartan-hydrochlorothiazide (HYZAAR) 100-25 MG tablet TAKE 1 TABLET EVERY DAY 90 tablet 2  . multivitamin (THERAGRAN) per tablet Take 1 tablet by mouth daily.      . nitroGLYCERIN (NITROSTAT) 0.4 MG SL tablet Place 1 tablet (0.4 mg total) under the tongue every 5 (five) minutes as needed for chest pain. 25 tablet 3  . Omega-3 Fatty Acids (FISH OIL PO) Take 1 capsule by mouth 2 (two) times daily.    . sodium chloride (OCEAN) 0.65 % nasal spray Place 1 spray into both nostrils at bedtime as needed for congestion.     Current Facility-Administered Medications on File Prior to Visit  Medication Dose Route Frequency Provider Last Rate Last Dose  . cloNIDine (CATAPRES) tablet 0.1 mg  0.1 mg Oral Once [name], NP        BP (!) 141/55 (BP Location: Right Arm, Cuff Size: Normal)   Pulse 89   Temp 98 F (36.7 C) (Oral)   Resp 16   Ht 5\' 7"  (1.702 m)   Wt 170 lb 6.4 oz (77.3 kg)   SpO2 97%   BMI 26.69 kg/m       Objective:   Physical Exam  Constitutional: He is oriented to person, place, and time. He appears well-developed and well-nourished. No distress.  HENT:  Head: Normocephalic and atraumatic.  Cardiovascular: Normal rate and regular rhythm.  No murmur heard. Pulmonary/Chest: Effort normal and breath sounds normal. No respiratory distress. He has no wheezes. He has no rales.  Musculoskeletal:  2+ bilateral LE edema  Neurological: He is alert and oriented to person, place, and time.  Skin: Skin is warm and dry.  Psychiatric: He has a normal mood and affect. His behavior is normal. Thought content normal.          Assessment & Plan:   Hyperlipidemia- myalgia on pravastatin.  Trial of low dose crestor. Pt is advised to call if recurrent muscle pain. Also will change lopid to fenofibrate due to less drug interactions  with fenofibrate and better dosing schedule.  Edema- likely related to his amlodipine. We are limited in his treatment options at this point for BP.  Will monitor for now- pt is agreeable.

## 2018-05-06 NOTE — Telephone Encounter (Signed)
Copied from [place] [id number]. Topic: Quick Communication - Rx Refill/Question >> May 06, 2018 12:27 PM [name] Pratt, [name] wrote: Medication: allopurinol (ZYLOPRIM) 300 MG tablet , losartan-hydrochlorothiazide (HYZAAR) 100-25 MG tablet  Has the patient contacted their pharmacy? Yes  Preferred Pharmacy (with phone number or street name): [place], [place] [phone] (Phone) [phone] (Fax)

## 2018-05-06 NOTE — Patient Instructions (Addendum)
Remain off of pravastatin. Start Crestor once daily- call if recurrent muscle cramping. Stop Gemfibrozil, start fenofibrate.

## 2018-05-07 DIAGNOSIS — E119 Type 2 diabetes mellitus without complications: Secondary | ICD-10-CM | POA: Diagnosis not present

## 2018-05-07 DIAGNOSIS — H35343 Macular cyst, hole, or pseudohole, bilateral: Secondary | ICD-10-CM | POA: Diagnosis not present

## 2018-05-07 DIAGNOSIS — Z961 Presence of intraocular lens: Secondary | ICD-10-CM | POA: Diagnosis not present

## 2018-05-07 DIAGNOSIS — H04123 Dry eye syndrome of bilateral lacrimal glands: Secondary | ICD-10-CM | POA: Diagnosis not present

## 2018-05-15 ENCOUNTER — Other Ambulatory Visit: Payer: Self-pay | Admitting: Cardiovascular Disease

## 2018-05-20 ENCOUNTER — Telehealth: Payer: Self-pay | Admitting: Family

## 2018-05-20 NOTE — Telephone Encounter (Signed)
Copied from [place] [id number]. Topic: Quick Communication - Rx Refill/Question >> May 20, 2018  8:17 AM Yvette Rack wrote: Medication: losartan-hydrochlorothiazide (HYZAAR) 100-25 MG tablet   need 5 pills until medicine come in mail order  Has the patient contacted their pharmacy? Yes.   (Agent: If no, request that the patient contact the pharmacy for the refill.) (Agent: If yes, when and what did the pharmacy advise?) called yesterday advised him to call provider  Preferred Pharmacy (with phone number or street name): [place] [phone] (Phone) [phone] (Fax)    Agent: Please be advised that RX refills may take up to 3 business days. We ask that you follow-up with your pharmacy.

## 2018-05-20 NOTE — Telephone Encounter (Signed)
Attempted to contact pt regarding prescription refill request for losartan-hctz; record shows that this was written per Dr Johnsie Cancel, cardiology; left message for pt to call office; when pt calls back please give him this information.

## 2018-05-20 NOTE — Telephone Encounter (Signed)
Second attempt to contact patient. Message to contact the office left on mobile number.

## 2018-05-21 ENCOUNTER — Other Ambulatory Visit: Payer: Self-pay | Admitting: Cardiovascular Disease

## 2018-05-21 ENCOUNTER — Telehealth: Payer: Self-pay | Admitting: *Deleted

## 2018-05-21 MED ORDER — LOSARTAN POTASSIUM-HCTZ 100-25 MG PO TABS: 1.0000 | ORAL_TABLET | Freq: Every day | ORAL | 0 refills | Status: DC

## 2018-05-21 NOTE — Telephone Encounter (Signed)
Pt returned call re: losartan-hctz, needs partial refill. Instructed pt to notify Dr. Johnsie Cancel; pt told to [facility] if continues to have issues will refill.

## 2018-05-21 NOTE — Telephone Encounter (Signed)
Pt called requesting a 10 day supply of losartan-hydrochlorothiazide sent to local pharmacy until [facility] mail order pharmacy sends pt's medication. Confirmation received.

## 2018-05-21 NOTE — Telephone Encounter (Signed)
Pt returned call re: losartan-hctz, needs partial refill. Instructed pt to notify Dr. Johnsie Cancel; pt told to [facility] if continues to have issues will refill.

## 2018-05-21 NOTE — Telephone Encounter (Signed)
Patient called and wanted to let the nurse know that he is getting the medication list below filled. Please advise.

## 2018-05-21 NOTE — Telephone Encounter (Signed)
Attempted to call pt.  Left VM on patient mobile number to call office.

## 2018-05-22 ENCOUNTER — Other Ambulatory Visit: Payer: Self-pay | Admitting: Family

## 2018-05-28 DIAGNOSIS — G4733 Obstructive sleep apnea (adult) (pediatric): Secondary | ICD-10-CM | POA: Diagnosis not present

## 2018-06-17 ENCOUNTER — Encounter: Payer: Self-pay | Admitting: Family

## 2018-06-17 ENCOUNTER — Ambulatory Visit (INDEPENDENT_AMBULATORY_CARE_PROVIDER_SITE_OTHER): Payer: Medicare HMO | Admitting: Family

## 2018-06-17 VITALS — BP 132/60 | HR 62 | Temp 98.0°F | Resp 16 | Ht 67.0 in | Wt 169.8 lb

## 2018-06-17 DIAGNOSIS — Z23 Encounter for immunization: Secondary | ICD-10-CM

## 2018-06-17 DIAGNOSIS — E785 Hyperlipidemia, unspecified: Secondary | ICD-10-CM | POA: Diagnosis not present

## 2018-06-17 DIAGNOSIS — I10 Essential (primary) hypertension: Secondary | ICD-10-CM

## 2018-06-17 LAB — LIPID PANEL
Cholesterol: 109 mg/dL (ref 0–200)
HDL: 38 mg/dL — ABNORMAL LOW (ref >39.00)
LDL Cholesterol: 53 mg/dL (ref 0–99)
NONHDL: 71.44
Total CHOL/HDL Ratio: 3
Triglycerides: 93 mg/dL (ref 0.0–149.0)
VLDL: 18.6 mg/dL (ref 0.0–40.0)

## 2018-06-17 MED ORDER — ROSUVASTATIN CALCIUM 5 MG PO TABS: 5.0000 mg | ORAL_TABLET | Freq: Every day | ORAL | 1 refills | Status: DC

## 2018-06-17 MED ORDER — ZOSTER VAC RECOMB ADJUVANTED 50 MCG/0.5ML IM SUSR: INTRAMUSCULAR | 1 refills | Status: DC

## 2018-06-17 NOTE — Progress Notes (Signed)
Subjective:    Patient ID: [name] Pratt, male    DOB: 10-Dec-1940, 77 y.o.   MRN: [id number]  HPI  Hyperlipidemia- pt had myalgia on pravastatin. Was given trial of low dose crestor.  Lopid was changed to fenofibrate.  There was a period of time where he was completely off of statins.  He noted that the cramping in his upper legs seem to improve but he continued to have lower extremity cramping particularly in the anterior shin area.  He notes that he continues to have some lower extremity edema.  Review of Systems See HPI  Past Medical History:  Diagnosis Date  . Arthritis   . Cancer ([place])    skin cancer  . Carotid stenosis    a. Carotid U/S 8/18: LICA < 56%, RICA 31-49%;  b.  Carotid U/S 7/02:  RICA 63-78%; LICA 5-88%; f/u 1 year  . Chest pain     Myoview in 2008 was normal.  Echo 7/09: EF 60%, normal wall motion, mild LVH, mild LAE.  [name] Complication of anesthesia    "stomach does not wake up"  . COPD (chronic obstructive pulmonary disease) ([place])   . GERD (gastroesophageal reflux disease)   . Gout   . History of chicken pox   . History of hemorrhoids   . History of kidney stones   . Hyperlipidemia    under control  . Hypertension    under control  . Hypothyroidism   . Ileus ([place])   . Inguinal hernia   . OSA (obstructive sleep apnea)   . Pancreatitis   . PONV (postoperative nausea and vomiting)   . Rosacea      Social History   Socioeconomic History  . Marital status: Married    Spouse name: Not on file  . Number of children: 1  . Years of education: Not on file  . Highest education level: Not on file  Occupational History  . 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 Financial resource strain: Not on file  . Food insecurity:    Worry: Not on file    Inability: Not on file  . Transportation needs:    Medical: Not on file    Non-medical: Not on file  Tobacco Use  . Smoking status: Former Smoker    Years: 11.00    Types:  Cigarettes    Last attempt to quit: 09/11/1978    Years since quitting: 39.7  . Smokeless tobacco: Never Used  Substance and Sexual Activity  . Alcohol use: Yes    Alcohol/week: 0.0 standard drinks    Comment: rare  . Drug use: No  . Sexual activity: Not on file  Lifestyle  . Physical activity:    Days per week: Not on file    Minutes per session: Not on file  . Stress: Not on file  Relationships  . Social connections:    Talks on phone: Not on file    Gets together: Not on file    Attends religious service: Not on file    Active member of club or organization: Not on file    Attends meetings of clubs or organizations: Not on file    Relationship status: Not on file  . Intimate partner violence:    Fear of current or ex partner: Not on file    Emotionally abused: Not on file    Physically abused: Not on file    Forced sexual activity: Not  on file  Other Topics Concern  . Not on file  Social History Narrative   Lives with his wife   He has one son- lives locally.   2 grandchildren   Has worked for [employer], trade union   Enjoys wood working   Completed HS.  [employer]    Past Surgical History:  Procedure Laterality Date  . ABDOMINAL HERNIA REPAIR  07/15/12   Dr [name]  . BROW LIFT  05/07/01  . CYSTOSCOPY WITH URETEROSCOPY AND STENT PLACEMENT Right 01/28/2014   Procedure: CYSTOSCOPY WITH URETEROSCOPY, BASKET RETRIVAL AND  STENT PLACEMENT;  Surgeon: [name], MD;  Location: [facility];  Service: Urology;  Laterality: Right;  . epidural injections     multiple procedures  . [place]   multiple times  . EYE SURGERY Bilateral 03/25/10, 2012   cataract removal  . HEMORRHOID SURGERY  2014  . HIATAL HERNIA REPAIR  12/21/93  . HOLMIUM LASER APPLICATION Right 05/13/4096   Procedure: HOLMIUM LASER APPLICATION;  Surgeon: [name], MD;  Location: [facility];  Service: Urology;  Laterality: Right;  . INGUINAL HERNIA REPAIR Right 07/15/12   Dr [name], x2  .  JOINT REPLACEMENT  07/25/04   right knee  . JOINT REPLACEMENT  07/13/10   left knee  . KNEE SURGERY  1995  . LAPAROSCOPIC CHOLECYSTECTOMY  09/20/06   with intraoperative cholangiogram and right inguinal herniorrhaphy with mesh   . LIGAMENT REPAIR Left 07/2013   shoulder  . LITHOTRIPSY     x2  . NASAL SEPTUM SURGERY  1975  . REFRACTIVE SURGERY Bilateral   . 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 SKIN CANCER DESTRUCTION     nose, ear  . TONSILLECTOMY  as child  . TOTAL HIP ARTHROPLASTY Left   . URETHRAL DILATION  1991   [name]    Family History  Problem Relation Age of Onset  . Cancer Mother   . Hypertension Unknown   . Cancer Unknown   . Osteoarthritis Unknown     Allergies  Allergen Reactions  . Metoclopramide Hcl     anxiety  . Pravastatin     Muscle cramps  . Sildenafil     Tachycardia      Current Outpatient Medications on File Prior to Visit  Medication Sig Dispense Refill  . albuterol (PROVENTIL HFA;VENTOLIN HFA) 108 (90 Base) MCG/ACT inhaler Inhale 2 puffs into the lungs every 6 (six) hours as needed for shortness of breath. 3 Inhaler 1  . allopurinol (ZYLOPRIM) 100 MG tablet Take 1 tablet (100 mg total) by mouth daily. 90 tablet 1  . allopurinol (ZYLOPRIM) 300 MG tablet TAKE 1 TABLET ONE TIME DAILY 90 tablet 1  . amLODipine (NORVASC) 10 MG tablet Take 1 tablet (10 mg total) by mouth daily. 90 tablet 1  . aspirin EC 81 MG tablet Take 81 mg by mouth every morning.    . Carboxymethylcellul-Glycerin (OPTIVE) 0.5-0.9 % SOLN Apply 1 drop to eye daily.    . cycloSPORINE (RESTASIS) 0.05 % ophthalmic emulsion Place 1 drop into both eyes 2 (two) times daily.    . diclofenac sodium (VOLTAREN) 1 % GEL Apply 1 application topically daily as needed (arthritis). 100 g 2  . diphenhydrAMINE (BENADRYL) 25 MG tablet Take 25 mg by mouth every 6 (six) hours as needed for allergies.    . fenofibrate (TRICOR) 145 MG tablet Take 1 tablet (145 mg total) by mouth daily. 30 tablet 5    .  gabapentin (NEURONTIN) 300 MG capsule TAKE 1 CAPSULE AT BEDTIME 90 capsule 1  . hydrALAZINE (APRESOLINE) 50 MG tablet TAKE 1 TABLET THREE TIMES DAILY 270 tablet 1  . IRON PO Take 50 mcg by mouth daily.    [name] levothyroxine (SYNTHROID, LEVOTHROID) 150 MCG tablet TAKE 1 TABLET ONE TIME DAILY 6 DAYS A WEEK AND 1/2 TABLET 1 DAY A WEEK 78 tablet 1  . losartan-hydrochlorothiazide (HYZAAR) 100-25 MG tablet Take 1 tablet by mouth daily. 10 tablet 0  . multivitamin (THERAGRAN) per tablet Take 1 tablet by mouth daily.      . nitroGLYCERIN (NITROSTAT) 0.4 MG SL tablet Place 1 tablet (0.4 mg total) under the tongue every 5 (five) minutes as needed for chest pain. 25 tablet 3  . Omega-3 Fatty Acids (FISH OIL PO) Take 1 capsule by mouth 2 (two) times daily.    . rosuvastatin (CRESTOR) 5 MG tablet Take 1 tablet (5 mg total) by mouth daily. 30 tablet 3  . sodium chloride (OCEAN) 0.65 % nasal spray Place 1 spray into both nostrils at bedtime as needed for congestion.     Current Facility-Administered Medications on File Prior to Visit  Medication Dose Route Frequency Provider Last Rate Last Dose  . cloNIDine (CATAPRES) tablet 0.1 mg  0.1 mg Oral Once [name], NP        BP 132/60 (BP Location: Right Arm, Patient Position: Sitting, Cuff Size: Small)   Pulse 62   Temp 98 F (36.7 C) (Oral)   Resp 16   Ht 5\' 7"  (1.702 m)   Wt 169 lb 12.8 oz (77 kg)   SpO2 97%   BMI 26.59 kg/m       Objective:   Physical Exam  Constitutional: He is oriented to person, place, and time. He appears well-developed and well-nourished. No distress.  HENT:  Head: Normocephalic and atraumatic.  Cardiovascular: Normal rate and regular rhythm.  No murmur heard. Pulmonary/Chest: Effort normal and breath sounds normal. No respiratory distress. He has no wheezes. He has no rales.  Musculoskeletal:  1-2+ bilateral LE edema  Neurological: He is alert and oriented to person, place, and time.  Skin: Skin is warm and  dry.  Psychiatric: He has a normal mood and affect. His behavior is normal. Thought content normal.          Assessment & Plan:  Hyperlipidemia-is not entirely clear if his cramping is statin related.  He had only modest improvement last time with discontinuation of statin.  Will check a follow-up lipid panel today.  I have advised patient to hold the Crestor for 2 weeks and give me a note and 2 weeks to let me know how he is feeling.  If he continues to have lower extremity cramping then I do not feel we can attribute it to statin and we will plan to resume.  Hypertension-blood pressure stable on current medication.  He does have some lower extremity edema but this is tolerable.  Continue same.

## 2018-06-17 NOTE — Patient Instructions (Signed)
Please complete lab work prior to leaving.  Try holding crestor for 2 weeks then let me know if your leg cramps improve or not.

## 2018-07-08 ENCOUNTER — Telehealth: Payer: Self-pay | Admitting: Family

## 2018-07-08 NOTE — Telephone Encounter (Signed)
Copied from [place] [id number]. Topic: Quick Communication - See Telephone Encounter >> Jul 08, 2018 12:31 PM Blase Mess A wrote: CRM for notification. See Telephone encounter for: 07/08/18.Patient is calling to check on the status of the CRM from 07/01/18 CRM from 07/01/18- pt calling stating  that he has been off the Statin drug for two weeks and still having cramps now in his lower legs at night lt leg is worst than right leg with the cramps

## 2018-07-08 NOTE — Telephone Encounter (Signed)
Stay very well hydrated. Could try a spoonful of pickle juice before bed to help. TY.

## 2018-07-08 NOTE — Telephone Encounter (Signed)
Spoke with [facility] and informed them that we were not showing any CRM and/or message for patient on 07/01/18; she resent the information over to me. Called patient to speak with him about his Statin medication and let him know that provider is out of office today, but that we will type note regarding this matter and send along with his message to provider. Informed patient to stay off of statin medication until we receive a response from his provider. Do not see Hx of DVT, has RLS; states that in his lower legs, it will hurt worse if he changes position in bed and in his feet, his big toe will "curl" with the pain. Please Advise/SLS 10/28

## 2018-07-08 NOTE — Telephone Encounter (Signed)
Notified pt and he voices understanding. He would like to know if he should stay off of Crestor? Has been off of it x 2 weeks per PCP's recommendation. Pt states he continues to stretch before bed but cramps are still present. Pt has been drinking flavored water with electrolytes as well per previous recommendation by Provider. Please advise regarding Crestor or if labs are needed?

## 2018-07-09 NOTE — Telephone Encounter (Signed)
I would recommend that he restart crestor since holding it did not help his symptoms any.

## 2018-07-10 NOTE — Telephone Encounter (Signed)
Notified pt and he voices understanding. 

## 2018-07-23 ENCOUNTER — Ambulatory Visit: Payer: Medicare HMO | Admitting: Family

## 2018-07-23 ENCOUNTER — Encounter: Payer: Self-pay | Admitting: Family

## 2018-07-23 DIAGNOSIS — G4731 Primary central sleep apnea: Secondary | ICD-10-CM | POA: Diagnosis not present

## 2018-07-23 DIAGNOSIS — H35352 Cystoid macular degeneration, left eye: Secondary | ICD-10-CM | POA: Diagnosis not present

## 2018-07-23 DIAGNOSIS — H35073 Retinal telangiectasis, bilateral: Secondary | ICD-10-CM | POA: Diagnosis not present

## 2018-07-24 ENCOUNTER — Telehealth: Payer: Self-pay | Admitting: Family

## 2018-07-24 ENCOUNTER — Telehealth: Payer: Self-pay | Admitting: *Deleted

## 2018-07-24 ENCOUNTER — Encounter: Payer: Self-pay | Admitting: Family

## 2018-07-24 ENCOUNTER — Ambulatory Visit (INDEPENDENT_AMBULATORY_CARE_PROVIDER_SITE_OTHER): Payer: Medicare HMO | Admitting: Family

## 2018-07-24 VITALS — BP 134/58 | HR 71 | Temp 98.0°F | Resp 16 | Ht 67.0 in | Wt 173.0 lb

## 2018-07-24 DIAGNOSIS — N289 Disorder of kidney and ureter, unspecified: Secondary | ICD-10-CM

## 2018-07-24 DIAGNOSIS — I10 Essential (primary) hypertension: Secondary | ICD-10-CM

## 2018-07-24 DIAGNOSIS — R7303 Prediabetes: Secondary | ICD-10-CM | POA: Diagnosis not present

## 2018-07-24 DIAGNOSIS — E039 Hypothyroidism, unspecified: Secondary | ICD-10-CM

## 2018-07-24 DIAGNOSIS — E785 Hyperlipidemia, unspecified: Secondary | ICD-10-CM | POA: Diagnosis not present

## 2018-07-24 DIAGNOSIS — M109 Gout, unspecified: Secondary | ICD-10-CM | POA: Diagnosis not present

## 2018-07-24 LAB — BASIC METABOLIC PANEL
BUN: 38 mg/dL — ABNORMAL HIGH (ref 6–23)
CO2: 28 mEq/L (ref 19–32)
Calcium: 9.5 mg/dL (ref 8.4–10.5)
Chloride: 104 mEq/L (ref 96–112)
Creatinine, Ser: 1.72 mg/dL — ABNORMAL HIGH (ref 0.40–1.50)
GFR: 41.03 mL/min — AB (ref >60.00)
Glucose, Bld: 135 mg/dL — ABNORMAL HIGH (ref 70–99)
Potassium: 3.7 mEq/L (ref 3.5–5.1)
SODIUM: 141 meq/L (ref 135–145)

## 2018-07-24 NOTE — Telephone Encounter (Signed)
Pt signed records release to Dr Zadie Rhine (retina specialist) for the last 3 office visits and results. Release faxed to [phone]. Awaiting records.

## 2018-07-24 NOTE — Progress Notes (Signed)
Subjective:    Patient ID: [name] Pratt, male    DOB: 03-09-1941, 77 y.o.   MRN: [id number]  HPI  Patient is a 77 year old male who presents today for follow-up.  Last visit he reported having intermittent leg cramping.  We suggested that he hold his Crestor for 2 weeks and then let us know if his cramping improved.  He contacted [place] on October 28 to let us know that he had been off of his statin for 2 weeks but continued to have leg cramping.  He completed a lower anterior duplex which noted no significant peripheral vascular disease. He has restarted and symptoms are fine.  He is now back on Crestor for the last several weeks without worsening of his leg cramps. Lab Results  Component Value Date   CHOL 109 06/17/2018   HDL 38.00 (L) 06/17/2018   LDLCALC 53 06/17/2018   TRIG 93.0 06/17/2018   CHOLHDL 3 06/17/2018   Gout- denies gout symptoms.    HTN- on amlodipine. Notes some LE edema in the evenings with prolonged swelling   BP Readings from Last 3 Encounters:  07/24/18 (!) 134/58  06/17/18 132/60  05/06/18 (!) 141/55    Reports that he saw retinal specialist due to some vision issues in his right eye. "I see waves."   He sees dr. Zadie Rhine in [place].  Hypothyroid- maintained on synthroid.  Lab Results  Component Value Date   TSH 2.75 04/22/2018     Review of Systems See HPI  Past Medical History:  Diagnosis Date  . Arthritis   . Cancer ([place])    skin cancer  . Carotid stenosis    a. Carotid U/S 6/94: LICA < 85%, RICA 46-27%;  b.  Carotid U/S 0/35:  RICA 00-93%; LICA 8-18%; f/u 1 year  . Chest pain     Myoview in 2008 was normal.  Echo 7/09: EF 60%, normal wall motion, mild LVH, mild LAE.  [name] Complication of anesthesia    "stomach does not wake up"  . COPD (chronic obstructive pulmonary disease) ([place])   . GERD (gastroesophageal reflux disease)   . Gout   . History of chicken pox   . History of hemorrhoids   . History of kidney stones   . Hyperlipidemia    under  control  . Hypertension    under control  . Hypothyroidism   . Ileus ([place])   . Inguinal hernia   . OSA (obstructive sleep apnea)   . Pancreatitis   . PONV (postoperative nausea and vomiting)   . Rosacea      Social History   Socioeconomic History  . Marital status: Married    Spouse name: Not on file  . Number of children: 1  . Years of education: Not on file  . Highest education level: Not on file  Occupational History  . 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 Financial resource strain: Not on file  . Food insecurity:    Worry: Not on file    Inability: Not on file  . Transportation needs:    Medical: Not on file    Non-medical: Not on file  Tobacco Use  . Smoking status: Former Smoker    Years: 11.00    Types: Cigarettes    Last attempt to quit: 09/11/1978    Years since quitting: 39.8  . Smokeless tobacco: Never Used  Substance and Sexual Activity  . Alcohol use: Yes  Alcohol/week: 0.0 standard drinks    Comment: rare  . Drug use: No  . Sexual activity: Not on file  Lifestyle  . Physical activity:    Days per week: Not on file    Minutes per session: Not on file  . Stress: Not on file  Relationships  . Social connections:    Talks on phone: Not on file    Gets together: Not on file    Attends religious service: Not on file    Active member of club or organization: Not on file    Attends meetings of clubs or organizations: Not on file    Relationship status: Not on file  . Intimate partner violence:    Fear of current or ex partner: Not on file    Emotionally abused: Not on file    Physically abused: Not on file    Forced sexual activity: Not on file  Other Topics Concern  . Not on file  Social History Narrative   Lives with his wife   He has one son- lives locally.   2 grandchildren   Has worked for [employer], trade union   Enjoys wood working   Completed HS.  [employer]    Past Surgical History:  Procedure  Laterality Date  . ABDOMINAL HERNIA REPAIR  07/15/12   Dr [name]  . BROW LIFT  05/07/01  . CYSTOSCOPY WITH URETEROSCOPY AND STENT PLACEMENT Right 01/28/2014   Procedure: CYSTOSCOPY WITH URETEROSCOPY, BASKET RETRIVAL AND  STENT PLACEMENT;  Surgeon: [name], MD;  Location: [facility];  Service: Urology;  Laterality: Right;  . epidural injections     multiple procedures  . [place]   multiple times  . EYE SURGERY Bilateral 03/25/10, 2012   cataract removal  . HEMORRHOID SURGERY  2014  . HIATAL HERNIA REPAIR  12/21/93  . HOLMIUM LASER APPLICATION Right 4/33/2951   Procedure: HOLMIUM LASER APPLICATION;  Surgeon: [name], MD;  Location: [facility];  Service: Urology;  Laterality: Right;  . INGUINAL HERNIA REPAIR Right 07/15/12   Dr [name], x2  . JOINT REPLACEMENT  07/25/04   right knee  . JOINT REPLACEMENT  07/13/10   left knee  . KNEE SURGERY  1995  . LAPAROSCOPIC CHOLECYSTECTOMY  09/20/06   with intraoperative cholangiogram and right inguinal herniorrhaphy with mesh   . LIGAMENT REPAIR Left 07/2013   shoulder  . LITHOTRIPSY     x2  . NASAL SEPTUM SURGERY  1975  . REFRACTIVE SURGERY Bilateral   . 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 SKIN CANCER DESTRUCTION     nose, ear  . TONSILLECTOMY  as child  . TOTAL HIP ARTHROPLASTY Left   . URETHRAL DILATION  1991   [name]    Family History  Problem Relation Age of Onset  . Cancer Mother   . Hypertension Unknown   . Cancer Unknown   . Osteoarthritis Unknown     Allergies  Allergen Reactions  . Metoclopramide Hcl     anxiety  . Pravastatin     Muscle cramps  . Sildenafil     Tachycardia      Current Outpatient Medications on File Prior to Visit  Medication Sig Dispense Refill  . albuterol (PROVENTIL HFA;VENTOLIN HFA) 108 (90 Base) MCG/ACT inhaler Inhale 2 puffs into the lungs every 6 (six) hours as needed for shortness of breath. 3 Inhaler 1  . allopurinol (ZYLOPRIM) 100 MG tablet Take 1 tablet  (100  mg total) by mouth daily. 90 tablet 1  . allopurinol (ZYLOPRIM) 300 MG tablet TAKE 1 TABLET ONE TIME DAILY 90 tablet 1  . amLODipine (NORVASC) 10 MG tablet Take 1 tablet (10 mg total) by mouth daily. 90 tablet 1  . aspirin EC 81 MG tablet Take 81 mg by mouth every morning.    . Carboxymethylcellul-Glycerin (OPTIVE) 0.5-0.9 % SOLN Apply 1 drop to eye daily.    . cycloSPORINE (RESTASIS) 0.05 % ophthalmic emulsion Place 1 drop into both eyes 2 (two) times daily.    . diclofenac sodium (VOLTAREN) 1 % GEL Apply 1 application topically daily as needed (arthritis). 100 g 2  . diphenhydrAMINE (BENADRYL) 25 MG tablet Take 25 mg by mouth every 6 (six) hours as needed for allergies.    . fenofibrate (TRICOR) 145 MG tablet Take 1 tablet (145 mg total) by mouth daily. 30 tablet 5  . gabapentin (NEURONTIN) 300 MG capsule TAKE 1 CAPSULE AT BEDTIME 90 capsule 1  . hydrALAZINE (APRESOLINE) 50 MG tablet TAKE 1 TABLET THREE TIMES DAILY 270 tablet 1  . IRON PO Take 50 mcg by mouth daily.    [name] levothyroxine (SYNTHROID, LEVOTHROID) 150 MCG tablet TAKE 1 TABLET ONE TIME DAILY 6 DAYS A WEEK AND 1/2 TABLET 1 DAY A WEEK 78 tablet 1  . losartan-hydrochlorothiazide (HYZAAR) 100-25 MG tablet Take 1 tablet by mouth daily. 10 tablet 0  . multivitamin (THERAGRAN) per tablet Take 1 tablet by mouth daily.      . nitroGLYCERIN (NITROSTAT) 0.4 MG SL tablet Place 1 tablet (0.4 mg total) under the tongue every 5 (five) minutes as needed for chest pain. 25 tablet 3  . Omega-3 Fatty Acids (FISH OIL PO) Take 1 capsule by mouth 2 (two) times daily.    . rosuvastatin (CRESTOR) 5 MG tablet Take 1 tablet (5 mg total) by mouth daily. 90 tablet 1  . sodium chloride (OCEAN) 0.65 % nasal spray Place 1 spray into both nostrils at bedtime as needed for congestion.    . Zoster Vaccine Adjuvanted [facility]) injection Inject 0.5mg  IM now and again in 2-6 months. 0.5 mL 1   Current Facility-Administered Medications on File Prior to Visit    Medication Dose Route Frequency Provider Last Rate Last Dose  . cloNIDine (CATAPRES) tablet 0.1 mg  0.1 mg Oral Once [name], NP        BP (!) 134/58 (BP Location: Right Arm, Patient Position: Sitting, Cuff Size: Small)   Pulse 71   Temp 98 F (36.7 C) (Oral)   Resp 16   Ht 5\' 7"  (1.702 m)   Wt 173 lb (78.5 kg)   SpO2 98%   BMI 27.10 kg/m       Objective:   Physical Exam  Constitutional: He is oriented to person, place, and time. He appears well-developed and well-nourished. No distress.  HENT:  Head: Normocephalic and atraumatic.  Cardiovascular: Normal rate and regular rhythm.  No murmur heard. Pulmonary/Chest: Effort normal and breath sounds normal. No respiratory distress. He has no wheezes. He has no rales.  Musculoskeletal:  Trace bilateral LE edema   Neurological: He is alert and oriented to person, place, and time.  Skin: Skin is warm and dry.  Psychiatric: He has a normal mood and affect. His behavior is normal. Thought content normal.          Assessment & Plan:  Hyperlipidemia- tolerating statin.  Continue statin and fenofibrate.  He notes that he has some retinal damage in  the right eye.  Was told by his retinal specialist Dr. Zadie Rhine that this was due to not wearing his CPAP.  He reports that he is now back on CPAP.  We will request copies of his records from his retinal specialist.  Hypertension-blood pressure stable on current medications.  Continue same.  Will obtain follow-up basic metabolic panel.  Gout-clinically stable on allopurinol.  Continue same.  Hypothyroid-clinically stable on Synthroid.  Continue same.  Lab Results  Component Value Date   HGBA1C 6.1 04/22/2018   HGBA1C 5.9 06/20/2017   HGBA1C 6.2 12/19/2016   Lab Results  Component Value Date   LDLCALC 53 06/17/2018   CREATININE 1.54 (H) 04/22/2018    Borderline diabetes type 2- clinically stable, monitor.  Lab Results  Component Value Date   HGBA1C 6.1 04/22/2018

## 2018-07-24 NOTE — Patient Instructions (Signed)
Please complete lab work prior to leaving.   

## 2018-07-24 NOTE — Telephone Encounter (Signed)
Please contact pt and let him know that kidney function has worsened slightly.  Is he taking any nsaids? If so please discontinue.  I would like to refer him nephrology for consult.

## 2018-07-25 ENCOUNTER — Other Ambulatory Visit: Payer: Self-pay | Admitting: Family

## 2018-07-25 ENCOUNTER — Telehealth: Payer: Self-pay | Admitting: *Deleted

## 2018-07-25 MED ORDER — FENOFIBRATE 145 MG PO TABS: 145.0000 mg | ORAL_TABLET | Freq: Every day | ORAL | 1 refills | Status: DC

## 2018-07-25 NOTE — Telephone Encounter (Signed)
Copied from [place] [id number]. Topic: Quick Communication - Rx Refill/Question >> Jul 25, 2018 12:35 PM Leward Quan A wrote: Medication: fenofibrate (TRICOR) 145 MG tablet  Has the patient contacted their pharmacy? Yes.     Preferred Pharmacy (with phone number or street name): [facility], [place] [phone] (Phone) [phone] (Fax)    Agent: Please be advised that RX refills may take up to 3 business days. We ask that you follow-up with your pharmacy.

## 2018-07-25 NOTE — Telephone Encounter (Signed)
Received Medical records from [facility]; forwarded to provider/SLS 1/14

## 2018-07-25 NOTE — Telephone Encounter (Signed)
Appt yesterday 07/24/18; "Continue fenofibrate."

## 2018-07-26 NOTE — Telephone Encounter (Signed)
He currently takes 1/2 of 81mg  Aspirin daily and Voltaren gel once a week. Pt has been advised to stop aspirin and voltaren at this time. Please advise if pt should continue off these products and if so are there any other alternatives? 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 He is agreeable to referral to nephrologist.

## 2018-07-29 ENCOUNTER — Encounter: Payer: Self-pay | Admitting: Family

## 2018-07-29 ENCOUNTER — Telehealth: Payer: Self-pay

## 2018-07-29 DIAGNOSIS — H35073 Retinal telangiectasis, bilateral: Secondary | ICD-10-CM | POA: Insufficient documentation

## 2018-07-29 HISTORY — DX: Retinal telangiectasis, bilateral: H35.073

## 2018-07-29 NOTE — Telephone Encounter (Signed)
Left detailed message on voicemail of below recommendation and to call if any questions.

## 2018-07-29 NOTE — Telephone Encounter (Signed)
Left detailed message and to call if any questions.

## 2018-07-29 NOTE — Telephone Encounter (Signed)
I think it is OK for him to continue the aspirin 81mg  once daily.  Hold off on voltaren gel for now. Can use tylenol prn pain.

## 2018-07-29 NOTE — Telephone Encounter (Signed)
Pt. Phoned author to relay that the day before his bloodwork, he had a dye placed into his eye from the opthamologist, and was not sure if that could have affected his kidney function, questioning the need for nephrology referral. "I also drank more coffee than usual that day". Pt. Stated he is still willing to see nephrologist if PCP recommends. Routed to PCP to advise.

## 2018-07-29 NOTE — Telephone Encounter (Signed)
Noted, unlikely related to eye issues.  I still think he should see nephrology.

## 2018-08-12 ENCOUNTER — Other Ambulatory Visit: Payer: Self-pay | Admitting: Family

## 2018-08-12 NOTE — Telephone Encounter (Signed)
Copied from [place] [id number]. Topic: Quick Communication - Rx Refill/Question >> Aug 12, 2018 11:34 AM Conception Chancy, NT wrote: Medication: amLODipine (NORVASC) 10 MG tablet  Has the patient contacted their pharmacy? Yes/ was told it was out of date  Preferred Pharmacy (with phone number or street name): [place], [place] [address] Phone: [phone] Fax: [phone]    Agent: Please be advised that RX refills may take up to 3 business days. We ask that you follow-up with your pharmacy.

## 2018-08-13 MED ORDER — AMLODIPINE BESYLATE 10 MG PO TABS: 10.0000 mg | ORAL_TABLET | Freq: Every day | ORAL | 0 refills | Status: DC

## 2018-08-13 NOTE — Telephone Encounter (Signed)
Requested Prescriptions  Pending Prescriptions Disp Refills  . amLODipine (NORVASC) 10 MG tablet 90 tablet 1    Sig: Take 1 tablet (10 mg total) by mouth daily.     Cardiovascular:  Calcium Channel Blockers Passed - 08/12/2018  4:09 PM      Passed - Last BP in normal range    BP Readings from Last 1 Encounters:  07/24/18 (!) 134/58         Passed - Valid encounter within last 6 months    Recent Outpatient Visits          2 weeks ago Essential hypertension   [facility] [place], NP   1 month ago Hyperlipidemia, unspecified hyperlipidemia type   [facility] [place], NP   3 months ago Hyperlipidemia, unspecified hyperlipidemia type   [facility] [place], NP   3 months ago Chronic gout without tophus, unspecified cause, unspecified site   [facility] [place], NP   9 months ago Essential hypertension   [facility] [place], NP      Future Appointments            In 2 months [name], NP [facility], [facility]

## 2018-08-20 ENCOUNTER — Telehealth: Payer: Self-pay | Admitting: Family

## 2018-08-20 NOTE — Telephone Encounter (Signed)
Copied from [place] [id number]. Topic: Quick Communication - See Telephone Encounter >> Aug 20, 2018  2:34 PM Burchel, Abbi R wrote: CRM for notification. See Telephone encounter for: 08/20/18.  Pt states he reached out to [facility] and they did not give him an appt yet, and he was wondering how urgent this referral is.  Please call pt to advise.  [phone]

## 2018-08-22 ENCOUNTER — Other Ambulatory Visit: Payer: Self-pay | Admitting: Family

## 2018-08-22 DIAGNOSIS — E039 Hypothyroidism, unspecified: Secondary | ICD-10-CM

## 2018-08-22 NOTE — Telephone Encounter (Signed)
Patient called to say that he had a missed call but did not know what it was about or who called him. Stated that he was waiting on a call from Lemar Livings regarding an appointment for [employer]. Please advise Ph# [phone]

## 2018-08-26 NOTE — Telephone Encounter (Signed)
Patient advised Nephrology referral appointments depend on the patient's condition. The nephrologist reviews his records and will scheduled according to patient's condition. He understands.

## 2018-10-11 ENCOUNTER — Ambulatory Visit: Payer: Self-pay | Admitting: Family

## 2018-10-11 ENCOUNTER — Encounter: Payer: Self-pay | Admitting: Family

## 2018-10-11 ENCOUNTER — Telehealth: Payer: Self-pay | Admitting: Family

## 2018-10-11 ENCOUNTER — Ambulatory Visit (HOSPITAL_BASED_OUTPATIENT_CLINIC_OR_DEPARTMENT_OTHER)
Admission: RE | Admit: 2018-10-11 | Discharge: 2018-10-11 | Disposition: A | Discharge status: Home / Self Care | Payer: Medicare HMO | Source: Ambulatory Visit | Attending: Family | Admitting: Family

## 2018-10-11 ENCOUNTER — Ambulatory Visit (INDEPENDENT_AMBULATORY_CARE_PROVIDER_SITE_OTHER): Payer: Medicare HMO | Admitting: Family

## 2018-10-11 VITALS — BP 122/60 | HR 66 | Temp 98.2°F | Resp 18 | Ht 67.0 in | Wt 178.0 lb

## 2018-10-11 DIAGNOSIS — S0990XA Unspecified injury of head, initial encounter: Secondary | ICD-10-CM | POA: Diagnosis not present

## 2018-10-11 DIAGNOSIS — R0781 Pleurodynia: Secondary | ICD-10-CM | POA: Diagnosis not present

## 2018-10-11 DIAGNOSIS — S299XXA Unspecified injury of thorax, initial encounter: Secondary | ICD-10-CM | POA: Diagnosis not present

## 2018-10-11 MED ORDER — TRAMADOL HCL 50 MG PO TABS: 50.0000 mg | ORAL_TABLET | Freq: Three times a day (TID) | ORAL | 0 refills | Status: DC | PRN

## 2018-10-11 NOTE — Telephone Encounter (Signed)
See result note- shared results with wife of ct and xray.

## 2018-10-11 NOTE — Telephone Encounter (Signed)
FYI

## 2018-10-11 NOTE — Telephone Encounter (Signed)
I advised pt to come over this afternoon to be seen due to possible rib fracture and report of head injury which I was not told about initially. Pt will head over now.

## 2018-10-11 NOTE — Telephone Encounter (Signed)
Pt. He fell going into his house this morning. Hurt left ribs - hurts with certain movement and when touched. No cuts or lacerations.Also hit his head and fell on his arm. Denies any pain to these areas. No availability today in office. Princess spoke with Ms. Inda Castle who recommends if pt. Is having difficulty ambulating to go to UC, otherwise can see pt. Monday. Appointment made for Monday. Instructed if symptoms worsen to go to UC. Verbalizes understanding.  Reason for Disposition . [1] High-risk adult (e.g., age > 56, osteoporosis, chronic steroid use) AND [2] still hurts  Answer Assessment - Initial Assessment Questions 1. MECHANISM: "How did the injury happen?"     [address] [address] going in the house 2. ONSET: "When did the injury happen?" (Minutes or hours ago)     This morning 3. LOCATION: "Where on the chest is the injury located?"     Left upper rib area 4. APPEARANCE: "What does the injury look like?"      No bruising or swelling 5. BLEEDING: "Is there any bleeding now? If so, ask: How long has it been bleeding?"     No bleeding 6. SEVERITY: "Any difficulty with breathing?"     No 7. SIZE: For cuts, bruises, or swelling, ask: "How large is it?" (e.g., inches or centimeters)     No cuts 8. PAIN: "Is there pain?" If so, ask: "How bad is the pain?"   (e.g., Scale 1-10; or mild, moderate, severe)     10 when area is touched 9. TETANUS: For any breaks in the skin, ask: "When was the last tetanus booster?"     Unsure 10. PREGNANCY: "Is there any chance you are pregnant?" "When was your last menstrual period?"       n/a  Protocols used: CHEST INJURY-A-AH

## 2018-10-11 NOTE — Patient Instructions (Addendum)
Please complete x-ray and head CT on the first floor.  You may use tramadol as needed for pain. We will contact you with your results.

## 2018-10-11 NOTE — Progress Notes (Signed)
Subjective:    Patient ID: [name] Pratt, male    DOB: 03-12-1941, 78 y.o.   MRN: [id number]  HPI   Patient is a 78 yr old male who presents today following a mechanical fall this morning. Reports that he was trying to walk in his doorway and his toe hit the threshold. He fell onto his left side striking his elbow and the left anterior side of his head. Having left sidedsided rib pain since then. Denies SOB or pleuritic pain.      Review of Systems  See HPI  Past Medical History:  Diagnosis Date  . Arthritis   . Cancer ([place])    skin cancer  . Carotid stenosis    a. Carotid U/S 7/61: LICA < 95%, RICA 09-32%;  b.  Carotid U/S 6/71:  RICA 24-58%; LICA 0-99%; f/u 1 year  . Chest pain     Myoview in 2008 was normal.  Echo 7/09: EF 60%, normal wall motion, mild LVH, mild LAE.  [name] Complication of anesthesia    "stomach does not wake up"  . COPD (chronic obstructive pulmonary disease) ([place])   . GERD (gastroesophageal reflux disease)   . Gout   . History of chicken pox   . History of hemorrhoids   . History of kidney stones   . Hyperlipidemia    under control  . Hypertension    under control  . Hypothyroidism   . Ileus ([place])   . Inguinal hernia   . OSA (obstructive sleep apnea)   . Pancreatitis   . PONV (postoperative nausea and vomiting)   . Rosacea   . 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     Social History   Socioeconomic History  . Marital status: Married    Spouse name: Not on file  . Number of children: 1  . Years of education: Not on file  . Highest education level: Not on file  Occupational History  . 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 Financial resource strain: Not on file  . Food insecurity:    Worry: Not on file    Inability: Not on file  . Transportation needs:    Medical: Not on file    Non-medical: Not on file  Tobacco Use  . Smoking status: Former Smoker   Years: 11.00    Types: Cigarettes    Last attempt to quit: 09/11/1978    Years since quitting: 40.1  . Smokeless tobacco: Never Used  Substance and Sexual Activity  . Alcohol use: Yes    Alcohol/week: 0.0 standard drinks    Comment: rare  . Drug use: No  . Sexual activity: Not on file  Lifestyle  . Physical activity:    Days per week: Not on file    Minutes per session: Not on file  . Stress: Not on file  Relationships  . Social connections:    Talks on phone: Not on file    Gets together: Not on file    Attends religious service: Not on file    Active member of club or organization: Not on file    Attends meetings of clubs or organizations: Not on file    Relationship status: Not on file  . Intimate partner violence:    Fear of current or ex partner: Not on file    Emotionally abused: Not on file    Physically abused:  Not on file    Forced sexual activity: Not on file  Other Topics Concern  . Not on file  Social History Narrative   Lives with his wife   He has one son- lives locally.   2 grandchildren   Has worked for [employer], trade union   Enjoys wood working   Completed HS.  [employer]    Past Surgical History:  Procedure Laterality Date  . ABDOMINAL HERNIA REPAIR  07/15/12   Dr [name]  . BROW LIFT  05/07/01  . CYSTOSCOPY WITH URETEROSCOPY AND STENT PLACEMENT Right 01/28/2014   Procedure: CYSTOSCOPY WITH URETEROSCOPY, BASKET RETRIVAL AND  STENT PLACEMENT;  Surgeon: [name], MD;  Location: [facility];  Service: Urology;  Laterality: Right;  . epidural injections     multiple procedures  . [place]   multiple times  . EYE SURGERY Bilateral 03/25/10, 2012   cataract removal  . HEMORRHOID SURGERY  2014  . HIATAL HERNIA REPAIR  12/21/93  . HOLMIUM LASER APPLICATION Right 4/43/1540   Procedure: HOLMIUM LASER APPLICATION;  Surgeon: [name], MD;  Location: [facility];  Service: Urology;  Laterality: Right;  . INGUINAL HERNIA REPAIR Right  07/15/12   Dr [name], x2  . JOINT REPLACEMENT  07/25/04   right knee  . JOINT REPLACEMENT  07/13/10   left knee  . KNEE SURGERY  1995  . LAPAROSCOPIC CHOLECYSTECTOMY  09/20/06   with intraoperative cholangiogram and right inguinal herniorrhaphy with mesh   . LIGAMENT REPAIR Left 07/2013   shoulder  . LITHOTRIPSY     x2  . NASAL SEPTUM SURGERY  1975  . REFRACTIVE SURGERY Bilateral   . 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 SKIN CANCER DESTRUCTION     nose, ear  . TONSILLECTOMY  as child  . TOTAL HIP ARTHROPLASTY Left   . URETHRAL DILATION  1991   [name]    Family History  Problem Relation Age of Onset  . Cancer Mother   . Hypertension Unknown   . Cancer Unknown   . Osteoarthritis Unknown     Allergies  Allergen Reactions  . Metoclopramide Hcl     anxiety  . Pravastatin     Muscle cramps  . Sildenafil     Tachycardia      Current Outpatient Medications on File Prior to Visit  Medication Sig Dispense Refill  . albuterol (PROVENTIL HFA;VENTOLIN HFA) 108 (90 Base) MCG/ACT inhaler Inhale 2 puffs into the lungs every 6 (six) hours as needed for shortness of breath. 3 Inhaler 1  . allopurinol (ZYLOPRIM) 100 MG tablet Take 1 tablet (100 mg total) by mouth daily. 90 tablet 1  . allopurinol (ZYLOPRIM) 300 MG tablet TAKE 1 TABLET ONE TIME DAILY 90 tablet 1  . amLODipine (NORVASC) 10 MG tablet Take 1 tablet (10 mg total) by mouth daily. 90 tablet 0  . aspirin EC 81 MG tablet Take 81 mg by mouth every morning.    . Carboxymethylcellul-Glycerin (OPTIVE) 0.5-0.9 % SOLN Apply 1 drop to eye daily.    . cycloSPORINE (RESTASIS) 0.05 % ophthalmic emulsion Place 1 drop into both eyes 2 (two) times daily.    . diclofenac sodium (VOLTAREN) 1 % GEL Apply 1 application topically daily as needed (arthritis). 100 g 2  . diphenhydrAMINE (BENADRYL) 25 MG tablet Take 25 mg by mouth every 6 (six) hours as needed for allergies.    . fenofibrate (TRICOR) 145 MG tablet Take 1 tablet (145  mg total)  by mouth daily. 90 tablet 1  . gabapentin (NEURONTIN) 300 MG capsule TAKE 1 CAPSULE AT BEDTIME 90 capsule 1  . hydrALAZINE (APRESOLINE) 50 MG tablet TAKE 1 TABLET THREE TIMES DAILY 270 tablet 1  . IRON PO Take 50 mcg by mouth daily.    [name] levothyroxine (SYNTHROID, LEVOTHROID) 150 MCG tablet TAKE 1 TABLET ONE TIME DAILY 6 DAYS PER WEEK AND 1/2 TABLET 1 DAY A WEEK 78 tablet 1  . losartan-hydrochlorothiazide (HYZAAR) 100-25 MG tablet Take 1 tablet by mouth daily. 10 tablet 0  . multivitamin (THERAGRAN) per tablet Take 1 tablet by mouth daily.      . nitroGLYCERIN (NITROSTAT) 0.4 MG SL tablet Place 1 tablet (0.4 mg total) under the tongue every 5 (five) minutes as needed for chest pain. 25 tablet 3  . Omega-3 Fatty Acids (FISH OIL PO) Take 1 capsule by mouth 2 (two) times daily.    . rosuvastatin (CRESTOR) 5 MG tablet Take 1 tablet (5 mg total) by mouth daily. 90 tablet 1  . sodium chloride (OCEAN) 0.65 % nasal spray Place 1 spray into both nostrils at bedtime as needed for congestion.    . Zoster Vaccine Adjuvanted [facility]) injection Inject 0.5mg  IM now and again in 2-6 months. 0.5 mL 1   Current Facility-Administered Medications on File Prior to Visit  Medication Dose Route Frequency Provider Last Rate Last Dose  . cloNIDine (CATAPRES) tablet 0.1 mg  0.1 mg Oral Once [name], NP        BP 122/60 (BP Location: Right Arm, Patient Position: Sitting, Cuff Size: Normal)   Pulse 66   Temp 98.2 F (36.8 C) (Oral)   Resp 18   Ht 5\' 7"  (1.702 m)   Wt 178 lb (80.7 kg)   SpO2 99%   BMI 27.88 kg/m        Objective:   Physical Exam Constitutional:      General: He is not in acute distress.    Appearance: He is well-developed.  HENT:     Head: Normocephalic and atraumatic.  Eyes:     Comments: + ecchymosis and mild swelling of left upper eyelid.  Cardiovascular:     Rate and Rhythm: Normal rate and regular rhythm.     Heart sounds: No murmur.  Pulmonary:     Effort:  Pulmonary effort is normal. No respiratory distress.     Breath sounds: Normal breath sounds. No wheezing or rales.  Skin:    General: Skin is warm and dry.  Neurological:     Mental Status: He is alert and oriented to person, place, and time.     Comments: EOM intact Bilateral UE/LE strength is 5/5  Psychiatric:        Behavior: Behavior normal.        Thought Content: Thought content normal.           Assessment & Plan:  Head trauma- obtain stat CT head to rule out bleed.  Rib pain- obtain CXR and L sided rib x-ray to rule out rib fracture/pneumothorax. Rx will be provided for tramadol prn. Reviewed [place] controlled substance registry.

## 2018-10-14 ENCOUNTER — Ambulatory Visit: Payer: Medicare HMO | Admitting: Family

## 2018-10-21 ENCOUNTER — Other Ambulatory Visit: Payer: Self-pay | Admitting: Family

## 2018-10-21 DIAGNOSIS — E039 Hypothyroidism, unspecified: Secondary | ICD-10-CM

## 2018-10-21 MED ORDER — HYDRALAZINE HCL 50 MG PO TABS: 50.0000 mg | ORAL_TABLET | Freq: Three times a day (TID) | ORAL | 1 refills | Status: DC

## 2018-10-21 MED ORDER — LEVOTHYROXINE SODIUM 150 MCG PO TABS: ORAL_TABLET | ORAL | 1 refills | Status: DC

## 2018-10-21 MED ORDER — FENOFIBRATE 145 MG PO TABS: 145.0000 mg | ORAL_TABLET | Freq: Every day | ORAL | 1 refills | Status: DC

## 2018-10-21 NOTE — Telephone Encounter (Signed)
Copied from [place] [id number]. Topic: Quick Communication - Rx Refill/Question >> Oct 21, 2018 12:18 PM Carolyn Stare wrote: Medication    fenofibrate (TRICOR) 145 MG tablet    hydrALAZINE (APRESOLINE) 50 MG tablet   IRON PO   levothyroxine (SYNTHROID, LEVOTHROID) 150 MCG tablet   Preferred Pharmacy  [facility]  Agent: Please be advised that RX refills may take up to 3 business days. We ask that you follow-up with your pharmacy.

## 2018-10-21 NOTE — Telephone Encounter (Signed)
Pt requesting refill of Iron 65mcg daily by historical provider. [facility] requesting "select an orderable medication" with alternatives listed.

## 2018-10-24 DIAGNOSIS — N183 Chronic kidney disease, stage 3 (moderate): Secondary | ICD-10-CM | POA: Diagnosis not present

## 2018-10-24 DIAGNOSIS — I129 Hypertensive chronic kidney disease with stage 1 through stage 4 chronic kidney disease, or unspecified chronic kidney disease: Secondary | ICD-10-CM | POA: Diagnosis not present

## 2018-10-24 DIAGNOSIS — Z87442 Personal history of urinary calculi: Secondary | ICD-10-CM | POA: Diagnosis not present

## 2018-10-24 DIAGNOSIS — N2581 Secondary hyperparathyroidism of renal origin: Secondary | ICD-10-CM | POA: Diagnosis not present

## 2018-10-24 DIAGNOSIS — D649 Anemia, unspecified: Secondary | ICD-10-CM | POA: Diagnosis not present

## 2018-10-28 ENCOUNTER — Ambulatory Visit (INDEPENDENT_AMBULATORY_CARE_PROVIDER_SITE_OTHER): Payer: Medicare HMO | Admitting: Family

## 2018-10-28 ENCOUNTER — Encounter: Payer: Self-pay | Admitting: Family

## 2018-10-28 VITALS — BP 131/67 | HR 62 | Temp 97.9°F | Resp 16 | Ht 67.0 in | Wt 177.6 lb

## 2018-10-28 DIAGNOSIS — E039 Hypothyroidism, unspecified: Secondary | ICD-10-CM | POA: Diagnosis not present

## 2018-10-28 DIAGNOSIS — R252 Cramp and spasm: Secondary | ICD-10-CM

## 2018-10-28 DIAGNOSIS — R739 Hyperglycemia, unspecified: Secondary | ICD-10-CM

## 2018-10-28 DIAGNOSIS — E7800 Pure hypercholesterolemia, unspecified: Secondary | ICD-10-CM

## 2018-10-28 DIAGNOSIS — R7303 Prediabetes: Secondary | ICD-10-CM

## 2018-10-28 LAB — BASIC METABOLIC PANEL
BUN: 40 mg/dL — ABNORMAL HIGH (ref 6–23)
CALCIUM: 9.4 mg/dL (ref 8.4–10.5)
CO2: 31 mEq/L (ref 19–32)
Chloride: 101 mEq/L (ref 96–112)
Creatinine, Ser: 1.9 mg/dL — ABNORMAL HIGH (ref 0.40–1.50)
GFR: 34.39 mL/min — ABNORMAL LOW (ref >60.00)
Glucose, Bld: 125 mg/dL — ABNORMAL HIGH (ref 70–99)
Potassium: 3.5 mEq/L (ref 3.5–5.1)
SODIUM: 140 meq/L (ref 135–145)

## 2018-10-28 LAB — HEMOGLOBIN A1C: Hgb A1c MFr Bld: 5.8 % (ref 4.6–6.5)

## 2018-10-28 LAB — MAGNESIUM: Magnesium: 2.1 mg/dL (ref 1.5–2.5)

## 2018-10-28 NOTE — Progress Notes (Addendum)
Subjective:    Patient ID: [name] Pratt, male    DOB: 11/27/1940, 78 y.o.   MRN: [id number]  HPI  Patient is a 78 yr old male who presents today for routine follow up.    HTN-maintained on allopurinol, hydralazine.  BP Readings from Last 3 Encounters:  10/28/18 131/67  10/11/18 122/60  07/24/18 (!) 134/58   Hypothyroid- reports feeling well on current dose of synthroid.  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  Lab Results  Component Value Date   HGBA1C 6.1 04/22/2018    Review of Systems    see HPI  Past Medical History:  Diagnosis Date  . Arthritis   . Cancer ([place])    skin cancer  . Carotid stenosis    a. Carotid U/S 3/88: LICA < 82%, RICA 80-03%;  b.  Carotid U/S 4/91:  RICA 79-15%; LICA 0-56%; f/u 1 year  . Chest pain     Myoview in 2008 was normal.  Echo 7/09: EF 60%, normal wall motion, mild LVH, mild LAE.  [name] Complication of anesthesia    "stomach does not wake up"  . COPD (chronic obstructive pulmonary disease) ([place])   . GERD (gastroesophageal reflux disease)   . Gout   . History of chicken pox   . History of hemorrhoids   . History of kidney stones   . Hyperlipidemia    under control  . Hypertension    under control  . Hypothyroidism   . Ileus ([place])   . Inguinal hernia   . OSA (obstructive sleep apnea)   . Pancreatitis   . PONV (postoperative nausea and vomiting)   . Rosacea   . 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     Social History   Socioeconomic History  . Marital status: Married    Spouse name: Not on file  . Number of children: 1  . Years of education: Not on file  . Highest education level: Not on file  Occupational History  .  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 Financial resource strain: Not on file  . Food insecurity:    Worry: Not on file    Inability: Not on file  . Transportation needs:    Medical: Not on file    Non-medical: Not on file  Tobacco Use  . Smoking status: Former Smoker    Years: 11.00    Types: Cigarettes    Last attempt to quit: 09/11/1978    Years since quitting: 40.1  . Smokeless tobacco: Never Used  Substance and Sexual Activity  . Alcohol use: Yes    Alcohol/week: 0.0 standard drinks    Comment: rare  . Drug use: No  . Sexual activity: Not on file  Lifestyle  . Physical activity:    Days per week: Not on file    Minutes per session: Not on file  . Stress: Not on file  Relationships  . Social connections:    Talks on phone: Not on file    Gets together: Not on file    Attends religious service: Not on file  Active member of club or organization: Not on file    Attends meetings of clubs or organizations: Not on file    Relationship status: Not on file  . Intimate partner violence:    Fear of current or ex partner: Not on file    Emotionally abused: Not on file    Physically abused: Not on file    Forced sexual activity: Not on file  Other Topics Concern  . Not on file  Social History Narrative   Lives with his wife   He has one son- lives locally.   2 grandchildren   Has worked for [employer], trade union   Enjoys wood working   Completed HS.  [employer]    Past Surgical History:  Procedure Laterality Date  . ABDOMINAL HERNIA REPAIR  07/15/12   Dr [name]  . BROW LIFT  05/07/01  . CYSTOSCOPY WITH URETEROSCOPY AND STENT PLACEMENT Right 01/28/2014   Procedure: CYSTOSCOPY WITH URETEROSCOPY, BASKET RETRIVAL AND  STENT PLACEMENT;  Surgeon: [name], MD;  Location[facility];  Service: Urology;  Laterality: Right;  . epidural injections     multiple procedures  . [place]   multiple times  . EYE SURGERY  Bilateral 03/25/10, 2012   cataract removal  . HEMORRHOID SURGERY  2014  . HIATAL HERNIA REPAIR  12/21/93  . HOLMIUM LASER APPLICATION Right 0/99/8338   Procedure: HOLMIUM LASER APPLICATION;  Surgeon: [name], MD;  [facility];  Service: Urology;  Laterality: Right;  . INGUINAL HERNIA REPAIR Right 07/15/12   Dr [name], x2  . JOINT REPLACEMENT  07/25/04   right knee  . JOINT REPLACEMENT  07/13/10   left knee  . KNEE SURGERY  1995  . LAPAROSCOPIC CHOLECYSTECTOMY  09/20/06   with intraoperative cholangiogram and right inguinal herniorrhaphy with mesh   . LIGAMENT REPAIR Left 07/2013   shoulder  . LITHOTRIPSY     x2  . NASAL SEPTUM SURGERY  1975  . REFRACTIVE SURGERY Bilateral   . 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 SKIN CANCER DESTRUCTION     nose, ear  . TONSILLECTOMY  as child  . TOTAL HIP ARTHROPLASTY Left   . URETHRAL DILATION  1991   [name]    Family History  Problem Relation Age of Onset  . Cancer Mother   . Hypertension Unknown   . Cancer Unknown   . Osteoarthritis Unknown     Allergies  Allergen Reactions  . Metoclopramide Hcl     anxiety  . Pravastatin     Muscle cramps  . Sildenafil     Tachycardia      Current Outpatient Medications on File Prior to Visit  Medication Sig Dispense Refill  . albuterol (PROVENTIL HFA;VENTOLIN HFA) 108 (90 Base) MCG/ACT inhaler Inhale 2 puffs into the lungs every 6 (six) hours as needed for shortness of breath. 3 Inhaler 1  . allopurinol (ZYLOPRIM) 100 MG tablet Take 1 tablet (100 mg total) by mouth daily. 90 tablet 1  . allopurinol (ZYLOPRIM) 300 MG tablet TAKE 1 TABLET ONE TIME DAILY 90 tablet 1  . amLODipine (NORVASC) 10 MG tablet Take 1 tablet (10 mg total) by mouth daily. 90 tablet 0  . aspirin EC 81 MG tablet Take 81 mg by mouth every morning.    . Carboxymethylcellul-Glycerin (OPTIVE) 0.5-0.9 % SOLN Apply 1 drop to eye daily.    . cycloSPORINE (RESTASIS) 0.05 % ophthalmic emulsion Place 1 drop into both  eyes 2 (two) times daily.    . diclofenac sodium (VOLTAREN) 1 % GEL Apply 1 application topically daily as needed (arthritis). 100 g 2  . diphenhydrAMINE (BENADRYL) 25 MG tablet Take 25 mg by mouth every 6 (six) hours as needed for allergies.    . fenofibrate (TRICOR) 145 MG tablet Take 1 tablet (145 mg total) by mouth daily. 90 tablet 1  . gabapentin (NEURONTIN) 300 MG capsule TAKE 1 CAPSULE AT BEDTIME 90 capsule 1  . hydrALAZINE (APRESOLINE) 50 MG tablet Take 1 tablet (50 mg total) by mouth 3 (three) times daily. 270 tablet 1  . IRON PO Take 50 mcg by mouth daily.    [name] levothyroxine (SYNTHROID, LEVOTHROID) 150 MCG tablet TAKE 1 TABLET ONE TIME DAILY 6 DAYS PER WEEK AND 1/2 TABLET 1 DAY A WEEK 78 tablet 1  . losartan-hydrochlorothiazide (HYZAAR) 100-25 MG tablet Take 1 tablet by mouth daily. 10 tablet 0  . multivitamin (THERAGRAN) per tablet Take 1 tablet by mouth daily.      . nitroGLYCERIN (NITROSTAT) 0.4 MG SL tablet Place 1 tablet (0.4 mg total) under the tongue every 5 (five) minutes as needed for chest pain. 25 tablet 3  . Omega-3 Fatty Acids (FISH OIL PO) Take 1 capsule by mouth 2 (two) times daily.    . rosuvastatin (CRESTOR) 5 MG tablet Take 1 tablet (5 mg total) by mouth daily. 90 tablet 1  . sodium chloride (OCEAN) 0.65 % nasal spray Place 1 spray into both nostrils at bedtime as needed for congestion.    . traMADol (ULTRAM) 50 MG tablet Take 1 tablet (50 mg total) by mouth every 8 (eight) hours as needed. 15 tablet 0  . Zoster Vaccine Adjuvanted [facility]) injection Inject 0.5mg  IM now and again in 2-6 months. 0.5 mL 1   Current Facility-Administered Medications on File Prior to Visit  Medication Dose Route Frequency Provider Last Rate Last Dose  . cloNIDine (CATAPRES) tablet 0.1 mg  0.1 mg Oral Once [name], NP        BP 131/67 (BP Location: Right Arm, Patient Position: Sitting, Cuff Size: Small)   Pulse 62   Temp 97.9 F (36.6 C) (Oral)   Resp 16   Ht 5\' 7"   (1.702 m)   Wt 177 lb 9.6 oz (80.6 kg)   SpO2 96%   BMI 27.82 kg/m    Objective:   Physical Exam Constitutional:      General: He is not in acute distress.    Appearance: He is well-developed.  HENT:     Head: Normocephalic and atraumatic.  Cardiovascular:     Rate and Rhythm: Normal rate and regular rhythm.     Heart sounds: No murmur.  Pulmonary:     Effort: Pulmonary effort is normal. No respiratory distress.     Breath sounds: Normal breath sounds. No wheezing or rales.  Musculoskeletal:     Right lower leg: 2+ Edema present.     Left lower leg: 2+ Edema present.  Skin:    General: Skin is warm and dry.  Neurological:     Mental Status: He is alert and oriented to person, place, and time.  Psychiatric:        Behavior: Behavior normal.        Thought Content: Thought content normal.           Assessment & Plan:  Borderline DM-  Lab Results  Component Value Date   HGBA1C 5.8 10/28/2018   Follow up A1C  is improved. Continue diabetic diet.  Monitor.  Hypothyroid- clinically stable on synthroid.  Plan tsh next visit.  Hyperlipidemia- LDL at goal.   Leg cramping- check bmet and magnesium.

## 2018-10-30 ENCOUNTER — Other Ambulatory Visit: Payer: Self-pay | Admitting: Nephrology

## 2018-10-30 DIAGNOSIS — N183 Chronic kidney disease, stage 3 unspecified: Secondary | ICD-10-CM

## 2018-11-04 ENCOUNTER — Ambulatory Visit
Admission: RE | Admit: 2018-11-04 | Discharge: 2018-11-04 | Disposition: A | Discharge status: Home / Self Care | Payer: Medicare HMO | Source: Ambulatory Visit | Attending: Nephrology | Admitting: Nephrology

## 2018-11-04 ENCOUNTER — Other Ambulatory Visit: Payer: Self-pay | Admitting: Family

## 2018-11-04 DIAGNOSIS — N183 Chronic kidney disease, stage 3 unspecified: Secondary | ICD-10-CM

## 2018-11-05 MED ORDER — ALLOPURINOL 100 MG PO TABS: 100.0000 mg | ORAL_TABLET | Freq: Every day | ORAL | 1 refills | Status: DC

## 2018-11-12 ENCOUNTER — Other Ambulatory Visit: Payer: Self-pay | Admitting: Family

## 2018-11-27 ENCOUNTER — Other Ambulatory Visit: Payer: Self-pay | Admitting: Family

## 2018-12-05 DIAGNOSIS — N3941 Urge incontinence: Secondary | ICD-10-CM | POA: Diagnosis not present

## 2018-12-05 DIAGNOSIS — N5201 Erectile dysfunction due to arterial insufficiency: Secondary | ICD-10-CM | POA: Diagnosis not present

## 2018-12-05 DIAGNOSIS — N200 Calculus of kidney: Secondary | ICD-10-CM | POA: Diagnosis not present

## 2018-12-22 ENCOUNTER — Encounter (HOSPITAL_BASED_OUTPATIENT_CLINIC_OR_DEPARTMENT_OTHER): Payer: Self-pay | Admitting: Emergency Medicine

## 2018-12-22 ENCOUNTER — Other Ambulatory Visit: Payer: Self-pay

## 2018-12-22 ENCOUNTER — Emergency Department (HOSPITAL_BASED_OUTPATIENT_CLINIC_OR_DEPARTMENT_OTHER): Payer: Medicare HMO

## 2018-12-22 ENCOUNTER — Emergency Department (HOSPITAL_BASED_OUTPATIENT_CLINIC_OR_DEPARTMENT_OTHER)
Admission: EM | Admit: 2018-12-22 | Discharge: 2018-12-22 | Disposition: A | Discharge status: Home / Self Care | Payer: Medicare HMO | Attending: Emergency Medicine | Admitting: Emergency Medicine

## 2018-12-22 DIAGNOSIS — J449 Chronic obstructive pulmonary disease, unspecified: Secondary | ICD-10-CM | POA: Insufficient documentation

## 2018-12-22 DIAGNOSIS — R0602 Shortness of breath: Secondary | ICD-10-CM | POA: Diagnosis not present

## 2018-12-22 DIAGNOSIS — R509 Fever, unspecified: Secondary | ICD-10-CM | POA: Insufficient documentation

## 2018-12-22 DIAGNOSIS — E039 Hypothyroidism, unspecified: Secondary | ICD-10-CM | POA: Insufficient documentation

## 2018-12-22 DIAGNOSIS — Z87891 Personal history of nicotine dependence: Secondary | ICD-10-CM | POA: Diagnosis not present

## 2018-12-22 DIAGNOSIS — I10 Essential (primary) hypertension: Secondary | ICD-10-CM | POA: Diagnosis not present

## 2018-12-22 DIAGNOSIS — R42 Dizziness and giddiness: Secondary | ICD-10-CM | POA: Diagnosis not present

## 2018-12-22 DIAGNOSIS — N3000 Acute cystitis without hematuria: Secondary | ICD-10-CM

## 2018-12-22 DIAGNOSIS — R05 Cough: Secondary | ICD-10-CM | POA: Diagnosis not present

## 2018-12-22 LAB — CBC WITH DIFFERENTIAL/PLATELET
Abs Immature Granulocytes: 0.13 10*3/uL — ABNORMAL HIGH (ref 0.00–0.07)
Basophils Absolute: 0 10*3/uL (ref 0.0–0.1)
Basophils Relative: 0 %
Eosinophils Absolute: 0 10*3/uL (ref 0.0–0.5)
Eosinophils Relative: 0 %
HCT: 36.1 % — ABNORMAL LOW (ref 39.0–52.0)
Hemoglobin: 11.7 g/dL — ABNORMAL LOW (ref 13.0–17.0)
Immature Granulocytes: 1 %
Lymphocytes Relative: 5 %
Lymphs Abs: 1.1 10*3/uL (ref 0.7–4.0)
MCH: 32.5 pg (ref 26.0–34.0)
MCHC: 32.4 g/dL (ref 30.0–36.0)
MCV: 100.3 fL — ABNORMAL HIGH (ref 80.0–100.0)
Monocytes Absolute: 1.5 10*3/uL — ABNORMAL HIGH (ref 0.1–1.0)
Monocytes Relative: 8 %
Neutro Abs: 17 10*3/uL — ABNORMAL HIGH (ref 1.7–7.7)
Neutrophils Relative %: 86 %
Platelets: 323 10*3/uL (ref 150–400)
RBC: 3.6 MIL/uL — ABNORMAL LOW (ref 4.22–5.81)
RDW: 13.7 % (ref 11.5–15.5)
WBC: 19.7 10*3/uL — ABNORMAL HIGH (ref 4.0–10.5)
nRBC: 0 % (ref 0.0–0.2)

## 2018-12-22 LAB — URINALYSIS, ROUTINE W REFLEX MICROSCOPIC
Bilirubin Urine: NEGATIVE
Glucose, UA: NEGATIVE mg/dL
Ketones, ur: NEGATIVE mg/dL
Nitrite: POSITIVE — AB
Protein, ur: 100 mg/dL — AB
Specific Gravity, Urine: 1.025 (ref 1.005–1.030)
pH: 5.5 (ref 5.0–8.0)

## 2018-12-22 LAB — LACTIC ACID, PLASMA: Lactic Acid, Venous: 0.9 mmol/L (ref 0.5–1.9)

## 2018-12-22 LAB — URINALYSIS, MICROSCOPIC (REFLEX): WBC, UA: >50 WBC/hpf (ref 0–5)

## 2018-12-22 LAB — COMPREHENSIVE METABOLIC PANEL
ALT: 18 U/L (ref 0–44)
AST: 24 U/L (ref 15–41)
Albumin: 4 g/dL (ref 3.5–5.0)
Alkaline Phosphatase: 33 U/L — ABNORMAL LOW (ref 38–126)
Anion gap: 10 (ref 5–15)
BUN: 32 mg/dL — ABNORMAL HIGH (ref 8–23)
CO2: 22 mmol/L (ref 22–32)
Calcium: 8.8 mg/dL — ABNORMAL LOW (ref 8.9–10.3)
Chloride: 102 mmol/L (ref 98–111)
Creatinine, Ser: 1.74 mg/dL — ABNORMAL HIGH (ref 0.61–1.24)
GFR calc Af Amer: 43 mL/min — ABNORMAL LOW (ref >60)
GFR calc non Af Amer: 37 mL/min — ABNORMAL LOW (ref >60)
Glucose, Bld: 118 mg/dL — ABNORMAL HIGH (ref 70–99)
Potassium: 3.1 mmol/L — ABNORMAL LOW (ref 3.5–5.1)
Sodium: 134 mmol/L — ABNORMAL LOW (ref 135–145)
Total Bilirubin: 0.8 mg/dL (ref 0.3–1.2)
Total Protein: 7.5 g/dL (ref 6.5–8.1)

## 2018-12-22 LAB — BRAIN NATRIURETIC PEPTIDE: B Natriuretic Peptide: 238 pg/mL — ABNORMAL HIGH (ref 0.0–100.0)

## 2018-12-22 LAB — TROPONIN I: Troponin I: 0.03 ng/mL (ref <0.03)

## 2018-12-22 MED ORDER — ACETAMINOPHEN 500 MG PO TABS
1000.0000 mg | ORAL_TABLET | Freq: Once | ORAL | Status: AC
  Administered 2018-12-22: 1000 mg via ORAL
  Filled 2018-12-22: qty 2

## 2018-12-22 MED ORDER — SODIUM CHLORIDE 0.9 % IV SOLN
1.0000 g | Freq: Once | INTRAVENOUS | Status: AC
  Administered 2018-12-22: 22:00:00 1 g via INTRAVENOUS
  Filled 2018-12-22: qty 10

## 2018-12-22 MED ORDER — SODIUM CHLORIDE 0.9 % IV SOLN
INTRAVENOUS | Status: DC | PRN
  Administered 2018-12-22: 250 mL via INTRAVENOUS

## 2018-12-22 MED ORDER — CEPHALEXIN 500 MG PO CAPS: 500.0000 mg | ORAL_CAPSULE | Freq: Two times a day (BID) | ORAL | 0 refills | Status: AC

## 2018-12-22 MED ORDER — SODIUM CHLORIDE 0.9 % IV BOLUS
500.0000 mL | Freq: Once | INTRAVENOUS | Status: AC
  Administered 2018-12-22: 500 mL via INTRAVENOUS

## 2018-12-22 NOTE — ED Provider Notes (Signed)
Emergency Department Provider Note   I have reviewed the triage vital signs and the nursing notes.   HISTORY  Chief Complaint Dizziness and Chills   HPI [name] Pratt is a 78 y.o. male with PMH of COPD, GERD, HLD, and HTN presents to the emergency department for evaluation of chills, lightheadedness, cough, fever.  Patient reports some shortness of breath with fatigue.  Denies chest pain.  He has had some mild nausea but no vomiting or diarrhea.  No abdominal discomfort.  He has felt increased gas.  He lives at home with his wife who is not ill.  He stays at home for the most part.  He states that his only trip out of the house was the [employer].  He took a Tylenol last at 12:30 PM today.  Patient denies any sore throat or anosmia.   Past Medical History:  Diagnosis Date  . Arthritis   . Cancer ([place])    skin cancer  . Carotid stenosis    a. Carotid U/S 3/00: LICA < 76%, RICA 22-63%;  b.  Carotid U/S 3/35:  RICA 45-62%; LICA 5-63%; f/u 1 year  . Chest pain     Myoview in 2008 was normal.  Echo 7/09: EF 60%, normal wall motion, mild LVH, mild LAE.  [name] Complication of anesthesia    "stomach does not wake up"  . COPD (chronic obstructive pulmonary disease) ([place])   . GERD (gastroesophageal reflux disease)   . Gout   . History of chicken pox   . History of hemorrhoids   . History of kidney stones   . Hyperlipidemia    under control  . Hypertension    under control  . Hypothyroidism   . Ileus ([place])   . Inguinal hernia   . OSA (obstructive sleep apnea)   . Pancreatitis   . PONV (postoperative nausea and vomiting)   . Rosacea   . 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    Patient Active Problem List   Diagnosis Date Noted  . 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 CPAP (continuous positive airway pressure) dependence 03/25/2018  . 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 Acute idiopathic  gout of hand 03/25/2018  . Erectile dysfunction 06/20/2017  . 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 Peripheral neuropathy 06/19/2016  . Hearing loss 02/02/2016  . 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 Allergic rhinitis 02/02/2016  . Atypical chest pain 11/24/2015  . Preventative health care 11/24/2015  . Onychomycosis 06/30/2015  . Degenerative disc disease, lumbar 06/30/2015  . Other allergic rhinitis 02/18/2015  . Deviated nasal septum 02/18/2015  . OSA on CPAP 02/18/2015  . RLS (restless legs syndrome) 02/18/2015  . GERD (gastroesophageal reflux disease) 09/18/2014  . Hyperglycemia 03/03/2014  . Sleep apnea with use of continuous positive airway pressure (CPAP) 07/15/2013  . Carotid stenosis 02/24/2013  . Nonspecific abnormal electrocardiogram (ECG) (EKG) 01/06/2013  . DJD (degenerative joint disease) of hip 12/24/2012  . Lower GI bleed 12/12/2012  . Dyspnea 06/12/2011  . Hypothyroidism 05/13/2009  . HYPERCHOLESTEROLEMIA 05/13/2009  . Hyperlipidemia 05/13/2009  . 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  . Essential hypertension 05/13/2009  . INGUINAL HERNIA 05/13/2009  . HIATAL HERNIA 05/13/2009  . NEPHROLITHIASIS 05/13/2009  . ROSACEA 05/13/2009    Past Surgical History:  Procedure Laterality Date  . ABDOMINAL HERNIA REPAIR  07/15/12   Dr [name]  . BROW LIFT  05/07/01  . CYSTOSCOPY WITH URETEROSCOPY AND STENT PLACEMENT Right 01/28/2014   Procedure: CYSTOSCOPY WITH URETEROSCOPY, BASKET RETRIVAL AND  STENT PLACEMENT;  Surgeon: [name], MD;  Location: [facility];  Service: Urology;  Laterality: Right;  . epidural injections     multiple procedures  . [place]   multiple times  . EYE SURGERY Bilateral 03/25/10, 2012   cataract removal  . HEMORRHOID SURGERY  2014  . HIATAL HERNIA REPAIR  12/21/93  . HOLMIUM LASER APPLICATION Right 12/18/8117   Procedure: HOLMIUM LASER APPLICATION;  Surgeon: [name], MD;  Location: [facility];   Service: Urology;  Laterality: Right;  . INGUINAL HERNIA REPAIR Right 07/15/12   Dr [name], x2  . JOINT REPLACEMENT  07/25/04   right knee  . JOINT REPLACEMENT  07/13/10   left knee  . KNEE SURGERY  1995  . LAPAROSCOPIC CHOLECYSTECTOMY  09/20/06   with intraoperative cholangiogram and right inguinal herniorrhaphy with mesh   . LIGAMENT REPAIR Left 07/2013   shoulder  . LITHOTRIPSY     x2  . NASAL SEPTUM SURGERY  1975  . REFRACTIVE SURGERY Bilateral   . 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 SKIN CANCER DESTRUCTION     nose, ear  . TONSILLECTOMY  as child  . TOTAL HIP ARTHROPLASTY Left   . URETHRAL DILATION  1991   [name]    Allergies Metoclopramide hcl; Pravastatin; and Sildenafil  Family History  Problem Relation Age of Onset  . Cancer Mother   . Hypertension Other   . Cancer Other   . Osteoarthritis Other     Social History Social History   Tobacco Use  . Smoking status: Former Smoker    Years: 11.00    Types: Cigarettes    Last attempt to quit: 09/11/1978    Years since quitting: 40.3  . Smokeless tobacco: Never Used  Substance Use Topics  . Alcohol use: Yes    Alcohol/week: 0.0 standard drinks    Comment: rare  . Drug use: No    Review of Systems  Constitutional: Positive fever/chills and fatigue.  Eyes: No visual changes. ENT: No sore throat. Cardiovascular: Denies chest pain. Respiratory: Positive SOB and dry cough.  Gastrointestinal: No abdominal pain. Positive nausea, no vomiting.  No diarrhea.  No constipation. Genitourinary: Negative for dysuria. Musculoskeletal: Negative for back pain. Skin: Negative for rash. Neurological: Negative for headaches, focal weakness or numbness.  10-point ROS otherwise negative.  ____________________________________________   PHYSICAL EXAM:  VITAL SIGNS: ED Triage Vitals  Enc Vitals Group     BP 12/22/18 1835 128/60     Pulse Rate 12/22/18 1835 73     Resp 12/22/18 1835 (!) 22     Temp 12/22/18 1835  (!) 102.6 F (39.2 C)     Temp Source 12/22/18 1835 Oral     SpO2 12/22/18 1835 96 %     Weight 12/22/18 1838 170 lb (77.1 kg)     Height 12/22/18 1838 5' 6.5" (1.689 m)   Constitutional: Alert and oriented. Well appearing and in no acute distress. Eyes: Conjunctivae are normal.  Head: Atraumatic. Nose: No congestion/rhinnorhea. Mouth/Throat: Mucous membranes are moist.  Neck: No stridor.   Cardiovascular: Normal rate, regular rhythm. Good peripheral circulation. Grossly normal heart sounds.   Respiratory: Increased respiratory effort.  No retractions. Lungs CTAB. Gastrointestinal: Soft and nontender. No distention.  Musculoskeletal: No lower extremity tenderness nor edema. No gross deformities of extremities. Neurologic:  Normal speech and language. No gross focal neurologic deficits are appreciated. Normal finger to nose testing. Normal strength and sensation in the upper and lower extremities. Skin:  Skin is warm, dry and intact. No rash noted.  ____________________________________________   LABS (all labs ordered are listed, but only abnormal results are displayed)  Labs Reviewed  COMPREHENSIVE METABOLIC PANEL - Abnormal; Notable for the following components:      Result Value   Sodium 134 (*)    Potassium 3.1 (*)    Glucose, Bld 118 (*)    BUN 32 (*)    Creatinine, Ser 1.74 (*)    Calcium 8.8 (*)    Alkaline Phosphatase 33 (*)    GFR calc non Af Amer 37 (*)    GFR calc Af Amer 43 (*)    All other components within normal limits  CBC WITH DIFFERENTIAL/PLATELET - Abnormal; Notable for the following components:   WBC 19.7 (*)    RBC 3.60 (*)    Hemoglobin 11.7 (*)    HCT 36.1 (*)    MCV 100.3 (*)    Neutro Abs 17.0 (*)    Monocytes Absolute 1.5 (*)    Abs Immature Granulocytes 0.13 (*)    All other components within normal limits  URINALYSIS, ROUTINE W REFLEX MICROSCOPIC - Abnormal; Notable for the following components:   APPearance CLOUDY (*)    Hgb urine dipstick  SMALL (*)    Protein, ur 100 (*)    Nitrite POSITIVE (*)    Leukocytes,Ua LARGE (*)    All other components within normal limits  BRAIN NATRIURETIC PEPTIDE - Abnormal; Notable for the following components:   B Natriuretic Peptide 238.0 (*)    All other components within normal limits  TROPONIN I - Abnormal; Notable for the following components:   Troponin I 0.03 (*)    All other components within normal limits  URINALYSIS, MICROSCOPIC (REFLEX) - Abnormal; Notable for the following components:   Bacteria, UA MANY (*)    All other components within normal limits  CULTURE, BLOOD (ROUTINE X 2)  CULTURE, BLOOD (ROUTINE X 2)  URINE CULTURE  LACTIC ACID, PLASMA   ____________________________________________  EKG   EKG Interpretation  Date/Time:  Sunday December 22 2018 19:42:16 EDT Ventricular Rate:  68 PR Interval:    QRS Duration: 155 QT Interval:  445 QTC Calculation: 474 R Axis:   -53 Text Interpretation:  Sinus rhythm Borderline prolonged PR interval RBBB and LAFB Probable left ventricular hypertrophy Anterior Q waves, possibly due to LVH No STEMI. Similar to prior.  Confirmed by Nanda Quinton [phone]) on 12/22/2018 8:08:20 PM       ____________________________________________  RADIOLOGY  Dg Chest Port 1 View  Result Date: 12/22/2018 CLINICAL DATA:  Chills for 2 nights, dizziness onset last night. Fever yesterday. Dry cough, shortness of breath and fatigue. EXAM: PORTABLE CHEST 1 VIEW COMPARISON:  Chest x-rays dated 10/11/2018 and 05/19/2016. FINDINGS: Heart size and mediastinal contours are within normal limits. Lungs are clear. No pleural effusions seen. Osseous structures about the chest are unremarkable. IMPRESSION: No active disease. No evidence of pneumonia or pulmonary edema. Electronically Signed   By: Franki Cabot M.D.   On: 12/22/2018 19:25    ____________________________________________   PROCEDURES  Procedure(s) performed:   Procedures  None   ____________________________________________   INITIAL IMPRESSION / ASSESSMENT AND PLAN / ED COURSE  Pertinent labs & imaging results that were available during my care of the patient were reviewed by me and considered  in my medical decision making (see chart for details).   Patient presents to the emergency department for evaluation of fever, cough, fatigue, lightheadedness.  Vital signs are significant for fever and slight increased respiratory rate.  No hypoxemia on room air.  Suspect a respiratory etiology of the patient's symptoms clinically.  No tenderness to palpation of the abdomen.  Contact and droplet precautions at this time. Plan for sepsis w/u and CXR.   Chest x-ray without infiltrate.  No hypoxemia.  Patient ambulated without hypoxemia.  Work shows significant UTI.  Patient does have urine hesitancy and frequency.  Plan for Rocephin.  No evidence of developing sepsis.  Patient feeling much better after IV fluids.  He has been ambulatory here without lightheadedness or other difficulty.  Plan for Keflex at home.  Urine and blood cultures sent. Troponin 0.03. No active chest pain. Plan for discharge. Patient comfortable with plan.    [name] Pratt was evaluated in Emergency Department on 12/22/2018 for the symptoms described in the history of present illness. He was evaluated in the context of the global COVID-19 pandemic, which necessitated consideration that the patient might be at risk for infection with the SARS-CoV-2 virus that causes COVID-19. Institutional protocols and algorithms that pertain to the evaluation of patients at risk for COVID-19 are in a state of rapid change based on information released by regulatory bodies including the CDC and federal and state organizations. These policies and algorithms were followed during the patient's care in the ED. ____________________________________________  FINAL CLINICAL IMPRESSION(S) / ED DIAGNOSES  Final diagnoses:  Acute  cystitis without hematuria  Fever, unspecified fever cause  Episodic lightheadedness     MEDICATIONS GIVEN DURING THIS VISIT:  Medications  0.9 %  sodium chloride infusion (250 mLs Intravenous New Bag/Given 12/22/18 2206)  acetaminophen (TYLENOL) tablet 1,000 mg (1,000 mg Oral Given 12/22/18 1918)  sodium chloride 0.9 % bolus 500 mL (0 mLs Intravenous Stopped 12/22/18 2039)  cefTRIAXone (ROCEPHIN) 1 g in sodium chloride 0.9 % 100 mL IVPB (1 g Intravenous New Bag/Given 12/22/18 2208)     NEW OUTPATIENT MEDICATIONS STARTED DURING THIS VISIT:  New Prescriptions   CEPHALEXIN (KEFLEX) 500 MG CAPSULE    Take 1 capsule (500 mg total) by mouth 2 (two) times daily for 7 days.    Note:  This document was prepared using Dragon voice recognition software and may include unintentional dictation errors.  Nanda Quinton, MD Emergency Medicine    , [facility], MD 12/22/18 [phone]

## 2018-12-22 NOTE — Discharge Instructions (Signed)
You have been seen in the Emergency Department (ED) today for pain when urinating.  Your workup today suggests that you have a urinary tract infection (UTI). ° °Please take your antibiotic as prescribed and over-the-counter pain medication (Tylenol) as needed, but no more than recommended on the label instructions.  Drink PLENTY of fluids. ° °Call your regular doctor to schedule the next available appointment to follow up on today?s ED visit, or return immediately to the ED if your pain worsens, you have decreased urine production, develop fever, persistent vomiting, or other symptoms that concern you. ° °

## 2018-12-22 NOTE — ED Notes (Signed)
Ambulated patient in room d/t isolation. Reports no shortness of breath, Sp02 98-100% throughout.

## 2018-12-22 NOTE — ED Notes (Signed)
Received verbal order to cancel repeat lactic from Dr. Laverta Baltimore as first draw was normal.

## 2018-12-22 NOTE — ED Notes (Signed)
Pt stated that he felt better just being here.

## 2018-12-22 NOTE — ED Triage Notes (Addendum)
Pt reports chills x 2 nights and dizziness onset last night. Pt reports fever 100.0 yesterday. Pt c/o dry cough, shortness of breath and fatigue for the past couple of days. Pt denies being around others with illness, but goes to [place] daily through the drive thru

## 2018-12-25 ENCOUNTER — Other Ambulatory Visit: Payer: Self-pay

## 2018-12-25 ENCOUNTER — Encounter: Payer: Self-pay | Admitting: Family

## 2018-12-25 ENCOUNTER — Ambulatory Visit (INDEPENDENT_AMBULATORY_CARE_PROVIDER_SITE_OTHER): Payer: Medicare HMO | Admitting: Family

## 2018-12-25 DIAGNOSIS — N3000 Acute cystitis without hematuria: Secondary | ICD-10-CM | POA: Diagnosis not present

## 2018-12-25 DIAGNOSIS — I10 Essential (primary) hypertension: Secondary | ICD-10-CM | POA: Diagnosis not present

## 2018-12-25 LAB — URINE CULTURE
Culture: >=100000 — AB
Special Requests: NORMAL

## 2018-12-25 NOTE — Progress Notes (Signed)
Virtual Visit via Video Note  I connected with Mr. Vencill on 12/25/18 at  2:20 PM EDT by a video enabled telemedicine application and verified that I am speaking with the correct person using two identifiers. This visit type was conducted due to national recommendations for restrictions regarding the COVID-19 Pandemic (e.g. social distancing).  This format is felt to be most appropriate for this patient at this time.   I discussed the limitations of evaluation and management by telemedicine and the availability of in person appointments. The patient expressed understanding and agreed to proceed.  Only the patient and myself were on today's video visit. The patient was at home and I was in my office at the time of today's visit.   History of Present Illness:  Patient is a 78 yr old male who presents today for ED follow up. He was evaluated on 12/22/18 due to c/o chills, lightheadedness, mild cough, fever tmax 102.5  Also reported some fatigue/sob. CXR was clear.    Urine culture was obtained and grew >100K of E. Coli. He was treated with Keflex.    Reports that he was treated with toviaz by his urologist and he had dry mouth, dizziness and constipation so he stopped the medication.   He reports feeling much better. Reports last temp 97.8. Was able to mow his yard yesterday.   Observations/Objective:   Gen: Awake, alert, no acute distress Resp: Breathing is even and non-labored Psych: calm/pleasant demeanor Neuro: Alert and Oriented x 3, + facial symmetry, speech is clear.   Assessment and Plan:  UTI- sensitivity data showed that e.coli was sensitive to cefazolin and ceftriaxone.  Clinically improved. Advised follow up with urology.   HTN- bp was stable in the ED. Continue hydralazine, clonidine.  BP Readings from Last 3 Encounters:  12/22/18 130/61  10/28/18 131/67  10/11/18 122/60    Follow Up Instructions:    I discussed the assessment and treatment plan with the patient.  The patient was provided an opportunity to ask questions and all were answered. The patient agreed with the plan and demonstrated an understanding of the instructions.   The patient was advised to call back or seek an in-person evaluation if the symptoms worsen or if the condition fails to improve as anticipated.    Nance Pear, NP

## 2018-12-26 ENCOUNTER — Telehealth: Payer: Self-pay | Admitting: *Deleted

## 2018-12-26 NOTE — Telephone Encounter (Signed)
Post ED Visit - Positive Culture Follow-up  Culture report reviewed by antimicrobial stewardship pharmacist: [place] Team []  Elenor Quinones, Pharm.D. []  Heide Guile, Pharm.D., BCPS AQ-ID []  Parks Neptune, Pharm.D., BCPS []  Alycia Rossetti, Pharm.D., BCPS []  [place], Pharm.D., BCPS, AAHIVP []  Legrand Como, Pharm.D., BCPS, AAHIVP [x]  Salome Arnt, PharmD, BCPS []  Johnnette Gourd, PharmD, BCPS []  Hughes Better, PharmD, BCPS []  Leeroy Cha, PharmD []  Laqueta Linden, PharmD, BCPS []  Albertina Parr, PharmD  [place] Team []  Leodis Sias, PharmD []  Lindell Spar, PharmD []  Royetta Asal, PharmD []  Graylin Shiver, Rph []  Rema Fendt) Glennon Mac, PharmD []  Arlyn Dunning, PharmD []  Netta Cedars, PharmD []  Dia Sitter, PharmD []  Leone Haven, PharmD []  Gretta Arab, PharmD []  Theodis Shove, PharmD []  Peggyann Juba, PharmD []  Reuel Boom, PharmD   Positive urine culture Treated with Cephalexin, organism sensitive to the same and no further patient follow-up is required at this time.  Harlon Flor [facility] 12/26/2018, 7:45 AM

## 2018-12-27 LAB — CULTURE, BLOOD (ROUTINE X 2)
Culture: NO GROWTH
Culture: NO GROWTH
Special Requests: ADEQUATE
Special Requests: ADEQUATE

## 2019-01-02 DIAGNOSIS — G4733 Obstructive sleep apnea (adult) (pediatric): Secondary | ICD-10-CM | POA: Diagnosis not present

## 2019-01-03 ENCOUNTER — Ambulatory Visit (INDEPENDENT_AMBULATORY_CARE_PROVIDER_SITE_OTHER): Payer: Medicare HMO | Admitting: Family

## 2019-01-03 ENCOUNTER — Ambulatory Visit: Payer: Self-pay

## 2019-01-03 ENCOUNTER — Other Ambulatory Visit: Payer: Self-pay

## 2019-01-03 VITALS — BP 135/68 | HR 67 | Temp 98.2°F

## 2019-01-03 DIAGNOSIS — K5900 Constipation, unspecified: Secondary | ICD-10-CM

## 2019-01-03 NOTE — Telephone Encounter (Signed)
Virtual visit scheduled.  

## 2019-01-03 NOTE — Progress Notes (Signed)
Virtual Visit via Video Note  I connected with@ on 01/03/19 at  3:20 PM EDT by a video enabled telemedicine application and verified that I am speaking with the correct person using two identifiers. This visit type was conducted due to national recommendations for restrictions regarding the COVID-19 Pandemic (e.g. social distancing).  This format is felt to be most appropriate for this patient at this time.   I discussed the limitations of evaluation and management by telemedicine and the availability of in person appointments. The patient expressed understanding and agreed to proceed.  Only the patient and myself were on today's video visit. The patient was at home and I was in my office at the time of today's visit.   Of note, we started on the call as a video visit but had audio issues so ultimately transitioned to a phone visit.   History of Present Illness:  Patient is a 78 yr old male who presents today with chief complaint of constipation. Reports that he had a BM around 8 days ago. This is his last "good bowel movement." Since that time he has had several small bm's with straining and "small balls."    Has used two suppositories, daily miralax x 5 days without significant improvement in his symptoms.  He attributes his Constipation to side effect of Toviaz.  Denies abdominal pain. Just has sensation "like I have to go."     Observations/Objective:  Gen: Awake, alert, no acute distress Resp: Breathing is even and non-labored Psych: calm/pleasant demeanor Neuro: Alert and Oriented x 3, + facial symmetry, speech is clear.  Assessment and Plan:  Constipation- advised pt to purchase otc magnesium citrate and drink 1/2 bottle as well as use a fleets enema.  Wait 6 hours. If still no bm, may drink the other half.  Call if now success with this regimen. Also advised after this clean out that he consider adding colace stool softener to counteract the side effect of the toviaz. Also suggested  that he make sure to drink plenty of water and eat lots of fresh fruits and vegetables. Pt verbalizes understanding.    Follow Up Instructions:    I discussed the assessment and treatment plan with the patient. The patient was provided an opportunity to ask questions and all were answered. The patient agreed with the plan and demonstrated an understanding of the instructions.   The patient was advised to call back or seek an in-person evaluation if the symptoms worsen or if the condition fails to improve as anticipated.   11 minutes spent today on phone with patient.   Nance Pear, NP

## 2019-01-03 NOTE — Telephone Encounter (Signed)
Patient called and says he's been having trouble with constipation and has not had a BM in 5 days. He says he's been taking Miralax for 5 days and has only passed a few balls. He says he's used a suppository and no stool came out, just the suppository. He says he's been having a cramping in his abdomen when he lay on his side, but it goes away when he was on his belly. He says his abdomen is a little larger than normal. He denies any other symptoms. I asked did he have capability for a web visit, he says yes. I called the office and spoke to [place], [facility] and she asks to speak to the patient, the call was connected successfully.  Answer Assessment - Initial Assessment Questions 1. STOOL PATTERN OR FREQUENCY: "How often do you pass bowel movements (BMs)?"  (Normal range: tid to q 3 days)  "When was the last BM passed?"       Normally daily; LBM probably 5 days ago 2. STRAINING: "Do you have to strain to have a BM?"      Yes and a few pieces come out 3. RECTAL PAIN: "Does your rectum hurt when the stool comes out?" If so, ask: "Do you have hemorrhoids? How bad is the pain?"  (Scale 1-10; or mild, moderate, severe)     No 4. STOOL COMPOSITION: "Are the stools hard?"      Solid and hard 5. BLOOD ON STOOLS: "Has there been any blood on the toilet tissue or on the surface of the BM?" If so, ask: "When was the last time?"      No 6. CHRONIC CONSTIPATION: "Is this a new problem for you?"  If no, ask: "How long have you had this problem?" (days, weeks, months)      New problem; off and on since taking a bladder pill 7. CHANGES IN DIET: "Have there been any recent changes in your diet?"      No 8. MEDICATIONS: "Have you been taking any new medications?"     Yes, bladder pill that was given by urologist 9. LAXATIVES: "Have you been using any laxatives or enemas?"  If yes, ask "What, how often, and when was the last time?"     Miralax, suppository 10. CAUSE: "What do you think is causing the constipation?"       I don't know 11. OTHER SYMPTOMS: "Do you have any other symptoms?" (e.g., abdominal pain, fever, vomiting)       Abdominal pain, but not now 12. PREGNANCY: "Is there any chance you are pregnant?" "When was your last menstrual period?"       N/A  Protocols used: CONSTIPATION-A-AH

## 2019-01-15 ENCOUNTER — Other Ambulatory Visit: Payer: Self-pay | Admitting: Cardiovascular Disease

## 2019-01-15 MED ORDER — LOSARTAN POTASSIUM-HCTZ 100-25 MG PO TABS: 1.0000 | ORAL_TABLET | Freq: Every day | ORAL | 0 refills | Status: DC

## 2019-01-16 ENCOUNTER — Other Ambulatory Visit: Payer: Self-pay

## 2019-01-16 ENCOUNTER — Encounter (HOSPITAL_BASED_OUTPATIENT_CLINIC_OR_DEPARTMENT_OTHER): Payer: Self-pay | Admitting: *Deleted

## 2019-01-16 ENCOUNTER — Emergency Department (HOSPITAL_BASED_OUTPATIENT_CLINIC_OR_DEPARTMENT_OTHER): Payer: Medicare HMO

## 2019-01-16 ENCOUNTER — Ambulatory Visit: Payer: Self-pay

## 2019-01-16 ENCOUNTER — Emergency Department (HOSPITAL_BASED_OUTPATIENT_CLINIC_OR_DEPARTMENT_OTHER)
Admission: EM | Admit: 2019-01-16 | Discharge: 2019-01-16 | Disposition: A | Discharge status: Home / Self Care | Payer: Medicare HMO | Attending: Emergency Medicine | Admitting: Emergency Medicine

## 2019-01-16 DIAGNOSIS — Z7982 Long term (current) use of aspirin: Secondary | ICD-10-CM | POA: Diagnosis not present

## 2019-01-16 DIAGNOSIS — I10 Essential (primary) hypertension: Secondary | ICD-10-CM | POA: Diagnosis not present

## 2019-01-16 DIAGNOSIS — N10 Acute tubulo-interstitial nephritis: Secondary | ICD-10-CM | POA: Diagnosis not present

## 2019-01-16 DIAGNOSIS — R05 Cough: Secondary | ICD-10-CM | POA: Diagnosis not present

## 2019-01-16 DIAGNOSIS — N1330 Unspecified hydronephrosis: Secondary | ICD-10-CM | POA: Diagnosis not present

## 2019-01-16 DIAGNOSIS — Z87891 Personal history of nicotine dependence: Secondary | ICD-10-CM | POA: Insufficient documentation

## 2019-01-16 DIAGNOSIS — Z79899 Other long term (current) drug therapy: Secondary | ICD-10-CM | POA: Insufficient documentation

## 2019-01-16 DIAGNOSIS — E039 Hypothyroidism, unspecified: Secondary | ICD-10-CM | POA: Insufficient documentation

## 2019-01-16 DIAGNOSIS — J449 Chronic obstructive pulmonary disease, unspecified: Secondary | ICD-10-CM | POA: Insufficient documentation

## 2019-01-16 DIAGNOSIS — Z1159 Encounter for screening for other viral diseases: Secondary | ICD-10-CM | POA: Insufficient documentation

## 2019-01-16 DIAGNOSIS — Z03818 Encounter for observation for suspected exposure to other biological agents ruled out: Secondary | ICD-10-CM | POA: Diagnosis not present

## 2019-01-16 DIAGNOSIS — R509 Fever, unspecified: Secondary | ICD-10-CM | POA: Diagnosis not present

## 2019-01-16 LAB — COMPREHENSIVE METABOLIC PANEL
ALT: 20 U/L (ref 0–44)
AST: 30 U/L (ref 15–41)
Albumin: 3.6 g/dL (ref 3.5–5.0)
Alkaline Phosphatase: 34 U/L — ABNORMAL LOW (ref 38–126)
Anion gap: 11 (ref 5–15)
BUN: 37 mg/dL — ABNORMAL HIGH (ref 8–23)
CO2: 21 mmol/L — ABNORMAL LOW (ref 22–32)
Calcium: 8.5 mg/dL — ABNORMAL LOW (ref 8.9–10.3)
Chloride: 101 mmol/L (ref 98–111)
Creatinine, Ser: 2.12 mg/dL — ABNORMAL HIGH (ref 0.61–1.24)
GFR calc Af Amer: 34 mL/min — ABNORMAL LOW (ref >60)
GFR calc non Af Amer: 29 mL/min — ABNORMAL LOW (ref >60)
Glucose, Bld: 109 mg/dL — ABNORMAL HIGH (ref 70–99)
Potassium: 3.6 mmol/L (ref 3.5–5.1)
Sodium: 133 mmol/L — ABNORMAL LOW (ref 135–145)
Total Bilirubin: 0.5 mg/dL (ref 0.3–1.2)
Total Protein: 7.2 g/dL (ref 6.5–8.1)

## 2019-01-16 LAB — CBC WITH DIFFERENTIAL/PLATELET
Abs Immature Granulocytes: 0.07 10*3/uL (ref 0.00–0.07)
Basophils Absolute: 0 10*3/uL (ref 0.0–0.1)
Basophils Relative: 0 %
Eosinophils Absolute: 0 10*3/uL (ref 0.0–0.5)
Eosinophils Relative: 0 %
HCT: 34.8 % — ABNORMAL LOW (ref 39.0–52.0)
Hemoglobin: 11.3 g/dL — ABNORMAL LOW (ref 13.0–17.0)
Immature Granulocytes: 1 %
Lymphocytes Relative: 14 %
Lymphs Abs: 1.2 10*3/uL (ref 0.7–4.0)
MCH: 32.1 pg (ref 26.0–34.0)
MCHC: 32.5 g/dL (ref 30.0–36.0)
MCV: 98.9 fL (ref 80.0–100.0)
Monocytes Absolute: 0.9 10*3/uL (ref 0.1–1.0)
Monocytes Relative: 11 %
Neutro Abs: 6.1 10*3/uL (ref 1.7–7.7)
Neutrophils Relative %: 74 %
Platelets: 375 10*3/uL (ref 150–400)
RBC: 3.52 MIL/uL — ABNORMAL LOW (ref 4.22–5.81)
RDW: 14 % (ref 11.5–15.5)
WBC: 8.2 10*3/uL (ref 4.0–10.5)
nRBC: 0 % (ref 0.0–0.2)

## 2019-01-16 LAB — URINALYSIS, ROUTINE W REFLEX MICROSCOPIC
Bilirubin Urine: NEGATIVE
Glucose, UA: NEGATIVE mg/dL
Ketones, ur: NEGATIVE mg/dL
Nitrite: POSITIVE — AB
Protein, ur: 30 mg/dL — AB
Specific Gravity, Urine: 1.02 (ref 1.005–1.030)
pH: 6 (ref 5.0–8.0)

## 2019-01-16 LAB — URINALYSIS, MICROSCOPIC (REFLEX): WBC, UA: >50 WBC/hpf (ref 0–5)

## 2019-01-16 LAB — LIPASE, BLOOD: Lipase: 23 U/L (ref 11–51)

## 2019-01-16 LAB — LACTIC ACID, PLASMA: Lactic Acid, Venous: 1 mmol/L (ref 0.5–1.9)

## 2019-01-16 MED ORDER — SODIUM CHLORIDE 0.9 % IV BOLUS
1000.0000 mL | Freq: Once | INTRAVENOUS | Status: AC
  Administered 2019-01-16: 17:00:00 1000 mL via INTRAVENOUS

## 2019-01-16 MED ORDER — CEFPODOXIME PROXETIL 100 MG PO TABS: 100.0000 mg | ORAL_TABLET | Freq: Two times a day (BID) | ORAL | 0 refills | Status: AC

## 2019-01-16 MED ORDER — ACETAMINOPHEN 500 MG PO TABS
1000.0000 mg | ORAL_TABLET | Freq: Once | ORAL | Status: AC
  Administered 2019-01-16: 1000 mg via ORAL
  Filled 2019-01-16: qty 2

## 2019-01-16 MED ORDER — SODIUM CHLORIDE 0.9 % IV SOLN
1.0000 g | Freq: Once | INTRAVENOUS | Status: AC
  Administered 2019-01-16: 1 g via INTRAVENOUS
  Filled 2019-01-16: qty 10

## 2019-01-16 NOTE — ED Notes (Signed)
Paged Dr. Junious Silk for second time for consult

## 2019-01-16 NOTE — ED Provider Notes (Signed)
[place] EMERGENCY DEPARTMENT Provider Note   CSN: [id number] Arrival date & time: 01/16/19  1707    History   Chief Complaint Chief Complaint  Patient presents with   Fever    HPI [name] Pratt is a 78 y.o. male.     78 yo M with a chief complaint of fevers and chills.  Going on for the past day or so.  Patient has had a mild cough.  Has had some lower abdominal pain and diarrhea that seems to have slightly resolved.  He called his family doctor today and they were concerned that he had a fever and cough and sent him here for evaluation for the novel coronavirus.  Patient denies any exposure denies any shortness of breath.  Has had a bit of a mild headache with coughing.  States that his abdominal pain is chronic for him.    The history is provided by the patient.  Fever  Associated symptoms: cough, diarrhea, headaches and sore throat   Associated symptoms: no chest pain, no chills, no confusion, no congestion, no myalgias, no rash and no vomiting   Illness  Severity:  Moderate Onset quality:  Gradual Duration:  2 days Timing:  Constant Progression:  Worsening Chronicity:  New Associated symptoms: abdominal pain, cough, diarrhea, fever, headaches and sore throat   Associated symptoms: no chest pain, no congestion, no myalgias, no rash, no shortness of breath and no vomiting     Past Medical History:  Diagnosis Date   Arthritis    Cancer ([place])    skin cancer   Carotid stenosis    a. Carotid U/S 4/12: LICA < 87%, RICA 86-76%;  b.  Carotid U/S 7/20:  RICA 94-70%; LICA 9-62%; f/u 1 year   Chest pain     Myoview in 2008 was normal.  Echo 7/09: EF 60%, normal wall motion, mild LVH, mild LAE.   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   Allergic rhinitis 02/02/2016   Atypical chest pain 11/24/2015   Preventative health care 11/24/2015   Onychomycosis 06/30/2015   Degenerative disc disease, lumbar 06/30/2015   Other allergic rhinitis 02/18/2015   Deviated nasal septum 02/18/2015   OSA on CPAP 02/18/2015   RLS (restless legs syndrome) 02/18/2015   GERD (gastroesophageal reflux disease) 09/18/2014   Hyperglycemia 03/03/2014   Sleep apnea with use of  continuous positive airway pressure (CPAP) 07/15/2013   Carotid stenosis 02/24/2013   Nonspecific abnormal electrocardiogram (ECG) (EKG) 01/06/2013   DJD (degenerative joint disease) of hip 12/24/2012   Lower GI bleed 12/12/2012   Dyspnea 06/12/2011   Hypothyroidism 05/13/2009   HYPERCHOLESTEROLEMIA 05/13/2009   Hyperlipidemia 05/13/2009   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   Essential hypertension 05/13/2009   INGUINAL HERNIA 05/13/2009   HIATAL HERNIA 05/13/2009   NEPHROLITHIASIS 05/13/2009   ROSACEA 05/13/2009    Past Surgical History:  Procedure Laterality Date   ABDOMINAL HERNIA REPAIR  07/15/12   Dr [name]   BROW LIFT  05/07/01   CYSTOSCOPY WITH URETEROSCOPY  AND STENT PLACEMENT Right 01/28/2014   Procedure: CYSTOSCOPY WITH URETEROSCOPY, BASKET RETRIVAL AND  STENT PLACEMENT;  Surgeon: [name], MD;  Location: [facility];  Service: Urology;  Laterality: Right;   epidural injections     multiple procedures   ESOPHAGEAL DILATION  1993 and 1994   multiple times   EYE SURGERY Bilateral 03/25/10, 2012   cataract removal   HEMORRHOID SURGERY  2014   HIATAL HERNIA REPAIR  12/21/93   HOLMIUM LASER APPLICATION Right 03/08/3150   Procedure: HOLMIUM LASER APPLICATION;  Surgeon: [name], MD;  Location: [facility];  Service: Urology;  Laterality: Right;   INGUINAL HERNIA REPAIR Right 07/15/12   Dr [name], x2   JOINT REPLACEMENT  07/25/04   right knee   JOINT REPLACEMENT  07/13/10   left knee   [place]  09/20/06   with intraoperative cholangiogram and right inguinal herniorrhaphy with mesh    LIGAMENT REPAIR Left 07/2013   shoulder   LITHOTRIPSY     x2   NASAL SEPTUM SURGERY  1975   REFRACTIVE SURGERY Bilateral    Right total hip replacement  4/14   SKIN CANCER DESTRUCTION     nose, ear   TONSILLECTOMY  as child   TOTAL HIP ARTHROPLASTY Left    URETHRAL DILATION  1991   [name]        Home Medications    Prior to Admission medications   Medication Sig Start Date End Date Taking? Authorizing Provider  albuterol (PROVENTIL HFA;VENTOLIN HFA) 108 (90 Base) MCG/ACT inhaler Inhale 2 puffs into the lungs every 6 (six) hours as needed for shortness of breath. 12/03/15   [name], NP  allopurinol (ZYLOPRIM) 100 MG tablet Take 1 tablet (100 mg total) by mouth daily. 11/05/18   [name], NP  allopurinol (ZYLOPRIM) 300 MG tablet TAKE 1 TABLET EVERY DAY 11/05/18   [name], NP  amLODipine (NORVASC) 10 MG tablet TAKE 1 TABLET EVERY DAY 11/12/18   [name], NP  aspirin EC 81 MG tablet Take 81 mg by mouth every morning.    [provider]    Carboxymethylcellul-Glycerin (OPTIVE) 0.5-0.9 % SOLN Apply 1 drop to eye daily.    [provider]  cefpodoxime (VANTIN) 100 MG tablet Take 1 tablet (100 mg total) by mouth 2 (two) times daily for 10 days. 01/16/19 01/26/19  Deno Etienne, DO  cycloSPORINE (RESTASIS) 0.05 % ophthalmic emulsion Place 1 drop into both eyes 2 (two) times daily.    [provider]  diclofenac sodium (VOLTAREN) 1 % GEL Apply 1 application topically daily as needed (arthritis). 09/15/16   [name], NP  diphenhydrAMINE (BENADRYL) 25 MG tablet Take 25 mg by mouth every 6 (six) hours as needed for allergies.    [provider]  fenofibrate (TRICOR) 145 MG  tablet Take 1 tablet (145 mg total) by mouth daily. 10/21/18   [name], NP  gabapentin (NEURONTIN) 300 MG capsule TAKE 1 CAPSULE AT BEDTIME 11/27/18   [name], NP  hydrALAZINE (APRESOLINE) 50 MG tablet Take 1 tablet (50 mg total) by mouth 3 (three) times daily. 10/21/18   [name], NP  IRON PO Take 50 mcg by mouth daily.    [provider]  levothyroxine (SYNTHROID, LEVOTHROID) 150 MCG tablet TAKE 1 TABLET ONE TIME DAILY 6 DAYS PER WEEK AND 1/2 TABLET 1 DAY A WEEK 10/21/18   [name], NP  losartan-hydrochlorothiazide (HYZAAR) 100-25 MG tablet Take 1 tablet by mouth daily. Please keep upcoming appt with Dr. Johnsie Cancel in June for future refills. Thank you 01/15/19   [name], MD  multivitamin [facility]) per tablet Take 1 tablet by mouth daily.      [provider]  nitroGLYCERIN (NITROSTAT) 0.4 MG SL tablet Place 1 tablet (0.4 mg total) under the tongue every 5 (five) minutes as needed for chest pain. 03/11/18   [name], MD  Omega-3 Fatty Acids (FISH OIL PO) Take 1 capsule by mouth 2 (two) times daily.    [provider]  rosuvastatin (CRESTOR) 5 MG tablet Take 1 tablet (5 mg total) by mouth daily. 06/17/18   [name], NP  sodium chloride (OCEAN) 0.65 % nasal  spray Place 1 spray into both nostrils at bedtime as needed for congestion.    [provider]  traMADol (ULTRAM) 50 MG tablet Take 1 tablet (50 mg total) by mouth every 8 (eight) hours as needed. 10/11/18   [name], NP  Zoster Vaccine Adjuvanted [facility]) injection Inject 0.5mg  IM now and again in 2-6 months. 06/17/18   [name], NP    Family History Family History  Problem Relation Age of Onset   Cancer Mother    Hypertension Other    Cancer Other    Osteoarthritis Other     Social History Social History   Tobacco Use   Smoking status: Former Smoker    Years: 11.00    Types: Cigarettes    Last attempt to quit: 09/11/1978    Years since quitting: 40.3   Smokeless tobacco: Never Used  Substance Use Topics   Alcohol use: Yes    Alcohol/week: 0.0 standard drinks    Comment: rare   Drug use: No     Allergies   Metoclopramide hcl; Pravastatin; and Sildenafil   Review of Systems Review of Systems  Constitutional: Positive for fever. Negative for chills.  HENT: Positive for sore throat. Negative for congestion and facial swelling.   Eyes: Negative for discharge and visual disturbance.  Respiratory: Positive for cough. Negative for shortness of breath.   Cardiovascular: Negative for chest pain and palpitations.  Gastrointestinal: Positive for abdominal pain and diarrhea. Negative for vomiting.  Musculoskeletal: Negative for arthralgias and myalgias.  Skin: Negative for color change and rash.  Neurological: Positive for headaches. Negative for tremors and syncope.  Psychiatric/Behavioral: Negative for confusion and dysphoric mood.     Physical Exam Updated Vital Signs BP 119/63 (BP Location: Left Arm)    Pulse 67    Temp 98.5 F (36.9 C) (Oral)    Resp 16    Ht 5' 6.5" (1.689 m)    Wt 77.1 kg    SpO2 95%    BMI 27.03 kg/m   Physical Exam Vitals signs and nursing note reviewed.  Constitutional:      Appearance: He  is  well-developed.  HENT:     Head: Normocephalic and atraumatic.  Eyes:     Pupils: Pupils are equal, round, and reactive to light.  Neck:     Musculoskeletal: Normal range of motion and neck supple.     Vascular: No JVD.  Cardiovascular:     Rate and Rhythm: Normal rate and regular rhythm.     Heart sounds: No murmur. No friction rub. No gallop.   Pulmonary:     Effort: No respiratory distress.     Breath sounds: No wheezing.  Abdominal:     General: There is no distension.     Tenderness: There is abdominal tenderness (diffuse lower,mostly right sided). There is no guarding or rebound.     Hernia: A hernia (ventral, easily redicible) is present.  Musculoskeletal: Normal range of motion.  Skin:    Coloration: Skin is not pale.     Findings: No rash.  Neurological:     Mental Status: He is alert and oriented to person, place, and time.  Psychiatric:        Behavior: Behavior normal.      ED Treatments / Results  Labs (all labs ordered are listed, but only abnormal results are displayed) Labs Reviewed  URINALYSIS, ROUTINE W REFLEX MICROSCOPIC - Abnormal; Notable for the following components:      Result Value   APPearance CLOUDY (*)    Hgb urine dipstick TRACE (*)    Protein, ur 30 (*)    Nitrite POSITIVE (*)    Leukocytes,Ua LARGE (*)    All other components within normal limits  CBC WITH DIFFERENTIAL/PLATELET - Abnormal; Notable for the following components:   RBC 3.52 (*)    Hemoglobin 11.3 (*)    HCT 34.8 (*)    All other components within normal limits  COMPREHENSIVE METABOLIC PANEL - Abnormal; Notable for the following components:   Sodium 133 (*)    CO2 21 (*)    Glucose, Bld 109 (*)    BUN 37 (*)    Creatinine, Ser 2.12 (*)    Calcium 8.5 (*)    Alkaline Phosphatase 34 (*)    GFR calc non Af Amer 29 (*)    GFR calc Af Amer 34 (*)    All other components within normal limits  URINALYSIS, MICROSCOPIC (REFLEX) - Abnormal; Notable for the following  components:   Bacteria, UA MANY (*)    All other components within normal limits  URINE CULTURE  CULTURE, BLOOD (ROUTINE X 2)  CULTURE, BLOOD (ROUTINE X 2)  NOVEL CORONAVIRUS, NAA (HOSPITAL ORDER, SEND-OUT TO REF LAB)  LACTIC ACID, PLASMA  LIPASE, BLOOD  LACTIC ACID, PLASMA    EKG None  Radiology Dg Chest Port 1 View  Result Date: 01/16/2019 CLINICAL DATA:  Cough, fever. EXAM: PORTABLE CHEST 1 VIEW COMPARISON:  Radiograph of December 22, 2018. FINDINGS: The heart size and mediastinal contours are within normal limits. Both lungs are clear. The visualized skeletal structures are unremarkable. IMPRESSION: No active disease. Electronically Signed   By: Marijo Conception M.D.   On: 01/16/2019 18:39   Ct Renal Stone Study  Result Date: 01/16/2019 CLINICAL DATA:  Flank pain, stone disease suspected EXAM: CT ABDOMEN AND PELVIS WITHOUT CONTRAST TECHNIQUE: Multidetector CT imaging of the abdomen and pelvis was performed following the standard protocol without IV contrast. COMPARISON:  CT 01/17/2014 FINDINGS: Lower chest: Lung bases demonstrate no acute consolidation or effusion. Heart size within normal limits. Moderate hiatal hernia. Hepatobiliary: Subcentimeter hypodensities  in the liver too small to further characterize. Status post cholecystectomy. No biliary enlargement. Pancreas: Unremarkable. No pancreatic ductal dilatation or surrounding inflammatory changes. Spleen: Normal in size without focal abnormality. Adrenals/Urinary Tract: Adrenal glands are within normal limits. Nonspecific perinephric fat stranding. 7 mm stone mid to lower right kidney. Mild right hydronephrosis and hydroureter. Small amount of vague hyperdensity in the distal right ureter about a cm proximal to the right UVJ without well-formed calculus. Stomach/Bowel: The stomach is nonenlarged. No dilated small bowel. 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 Focal inflammatory change and mild wall thickening at  the distal descending colon. Vascular/Lymphatic: Moderate aortic atherosclerosis without aneurysm. No significantly enlarged lymph nodes Reproductive: Slightly enlarged prostate with calcification Other: No free air.  No significant pelvic effusion Musculoskeletal: Bilateral hip replacements with artifact. Degenerative changes of the spine without acute or suspicious abnormality IMPRESSION: 1. Mild right hydronephrosis and hydroureter. Vague hyperdensity within the distal right ureter about a cm proximal to the right UVJ without well-formed stones, could reflect hemorrhagic debris in the ureter versus faintly calcified small stone. 2. 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 Focal wall thickening with mild inflammatory changes at the distal descending colon, consistent with acute diverticulitis. No perforation or abscess. 3. Intrarenal stone on the right Electronically Signed   By: Donavan Foil M.D.   On: 01/16/2019 19:17    Procedures Procedures (including critical care time)  Medications Ordered in ED Medications  cefTRIAXone (ROCEPHIN) 1 g in sodium chloride 0.9 % 100 mL IVPB (has no administration in time range)  sodium chloride 0.9 % bolus 1,000 mL (0 mLs Intravenous Stopped 01/16/19 1837)  acetaminophen (TYLENOL) tablet 1,000 mg (1,000 mg Oral Given 01/16/19 1738)     Initial Impression / Assessment and Plan / ED Course  I have reviewed the triage vital signs and the nursing notes.  Pertinent labs & imaging results that were available during my care of the patient were reviewed by me and considered in my medical decision making (see chart for details).        78 yo M with a chief complaint of fever and cough.  Also having abdominal pain and diarrhea.  Going on since yesterday.  Patient is well-appearing and nontoxic.  He is not hypoxic.  Had a temperature of 102 at home.  PCP was concerned for the novel coronavirus.  Will obtain lab work chest x-ray urine.  Of  note the patient was recently seen and found to have a urinary tract infection.  Grew E. coli.  No specific urinary symptoms here.  Will obtain chest x-ray UA cultures lactate give a bolus of fluids.  CT scan was concerning for some mild hydronephrosis with no identified stone.  UA was consistent with urinary tract infection.  Chest x-ray viewed by me without focal infiltrate.  I discussed the case with Dr. Junious Silk, he felt that the patient could go home with antibiotics after IV dose here.  We will have him follow-up in the office.  9:23 PM:  I have discussed the diagnosis/risks/treatment options with the patient and believe the pt to be eligible for discharge home to follow-up with Urology, PCP. We also discussed returning to the ED immediately if new or worsening sx occur. We discussed the sx which are most concerning (e.g., sudden worsening pain, fever, inability to tolerate by mouth) that necessitate immediate return. Medications administered to the patient during their visit and any new prescriptions provided to the patient are listed  below.  Medications given during this visit Medications  cefTRIAXone (ROCEPHIN) 1 g in sodium chloride 0.9 % 100 mL IVPB (has no administration in time range)  sodium chloride 0.9 % bolus 1,000 mL (0 mLs Intravenous Stopped 01/16/19 1837)  acetaminophen (TYLENOL) tablet 1,000 mg (1,000 mg Oral Given 01/16/19 1738)     The patient appears reasonably screen and/or stabilized for discharge and I doubt any other medical condition or other [facility] requiring further screening, evaluation, or treatment in the ED at this time prior to discharge.    Final Clinical Impressions(s) / ED Diagnoses   Final diagnoses:  Acute pyelonephritis    ED Discharge Orders         Ordered    cefpodoxime (VANTIN) 100 MG tablet  2 times daily     01/16/19 2120           Deno Etienne, DO 01/16/19 2123

## 2019-01-16 NOTE — ED Triage Notes (Signed)
Pt co fever land chills with recent DX UTI  W/o improvement

## 2019-01-16 NOTE — Telephone Encounter (Signed)
Pt called to say that he has felt bad since last night. He states that in April he went to the ER for a urinary problem and was give antibiotic. He is not sure if maybe this is the same thing. He says he has chills that come and go. He just checked his temperature and it is 101.8. He states he has a dry cough and feel nauseated. He has a headache. I asked about pain/burning with urination and he states maybe a little. He has no know exposure to a positive COVID-19 patient. He has been out to [employer] and has used masks. Per protocol pt will go to [place] for evaluation. Call placed to the ER. Spoke with the ER doctor. Ok to receive patient.  Reason for Disposition . [1] Fever > 101 F (38.3 C) AND [2] age > 32  Answer Assessment - Initial Assessment Questions 1. COVID-19 DIAGNOSIS: "Who made your Coronavirus (COVID-19) diagnosis?" "Was it confirmed by a positive lab test?" If not diagnosed by a HCP, ask "Are there lots of cases (community spread) where you live?" (See public health department website, if unsure)   * MAJOR community spread: high number of cases; numbers of cases are increasing; many people hospitalized.   * MINOR community spread: low number of cases; not increasing; few or no people hospitalized    [place] 2. ONSET: "When did the COVID-19 symptoms start?"      Last night 3. WORST SYMPTOM: "What is your worst symptom?" (e.g., cough, fever, shortness of breath, muscle aches)     chills 4. COUGH: "Do you have a cough?" If so, ask: "How bad is the cough?"       Dry cough occasional 5. FEVER: "Do you have a fever?" If so, ask: "What is your temperature, how was it measured, and when did it start?"    101.8 6. RESPIRATORY STATUS: "Describe your breathing?" (e.g., shortness of breath, wheezing, unable to speak)      No issues 7. BETTER-SAME-WORSE: "Are you getting better, staying the same or getting worse compared to yesterday?"  If getting worse, ask, "In what way?"  Stay the same 8. HIGH RISK DISEASE: "Do you have any chronic medical problems?" (e.g., asthma, heart or lung disease, weak immune system, etc.)     Angina, COPD 9. PREGNANCY: "Is there any chance you are pregnant?" "When was your last menstrual period?"     N/A 10. OTHER SYMPTOMS: "Do you have any other symptoms?"  (e.g., runny nose, headache, sore throat, loss of smell)      Headache, seasonal allergies  Protocols used: CORONAVIRUS (COVID-19) DIAGNOSED OR SUSPECTED-A-AH

## 2019-01-16 NOTE — Discharge Instructions (Signed)
Return for worsening pain, fever, inability to eat or drink 

## 2019-01-16 NOTE — ED Notes (Signed)
ED Provider at bedside. 

## 2019-01-17 NOTE — Telephone Encounter (Signed)
FYI

## 2019-01-18 LAB — URINE CULTURE: Culture: >=100000 — AB

## 2019-01-18 LAB — NOVEL CORONAVIRUS, NAA (HOSP ORDER, SEND-OUT TO REF LAB; TAT 18-24 HRS): SARS-CoV-2, NAA: NOT DETECTED

## 2019-01-19 ENCOUNTER — Telehealth: Payer: Self-pay | Admitting: Emergency Medicine

## 2019-01-19 NOTE — Telephone Encounter (Signed)
Post ED Visit - Positive Culture Follow-up  Culture report reviewed by antimicrobial stewardship pharmacist: [facility] Team []  [name] Pratt, Pharm.D. []  [name] Pratt, Pharm.D., BCPS AQ-ID []  [name] Pratt, Pharm.D., BCPS []  [name] Pratt, Pharm.D., BCPS []  [name] Pratt, Pharm.D., BCPS, AAHIVP []  [name] Pratt, Pharm.D., BCPS, AAHIVP []  [name] Pratt, PharmD, BCPS []  [name] Pratt, PharmD, BCPS []  [name] Pratt, PharmD, BCPS [x]  [name] Pratt, PharmD []  [name] Pratt, PharmD, BCPS []  [name] Pratt, PharmD  [place] Team []  [name] Pratt, PharmD []  [name] Pratt, PharmD []  [name] Pratt, PharmD []  [name] Pratt, Rph []  [name] Pratt) [name] Pratt, PharmD []  [name] Pratt, PharmD []  [name] Pratt, PharmD []  [name] Pratt, PharmD []  [name] Pratt, PharmD []  Gretta Arab, PharmD []  Theodis Shove, PharmD []  Peggyann Juba, PharmD []  Reuel Boom, PharmD   Positive urine culture Treated with Cefpodoxime, organism sensitive to the same and no further patient follow-up is required at this time.  [name] 01/19/2019, 4:04 PM

## 2019-01-20 DIAGNOSIS — N3000 Acute cystitis without hematuria: Secondary | ICD-10-CM | POA: Diagnosis not present

## 2019-01-20 DIAGNOSIS — N1330 Unspecified hydronephrosis: Secondary | ICD-10-CM | POA: Diagnosis not present

## 2019-01-20 DIAGNOSIS — N202 Calculus of kidney with calculus of ureter: Secondary | ICD-10-CM | POA: Diagnosis not present

## 2019-01-21 LAB — CULTURE, BLOOD (ROUTINE X 2)
Culture: NO GROWTH
Culture: NO GROWTH
Special Requests: ADEQUATE
Special Requests: ADEQUATE

## 2019-01-27 ENCOUNTER — Other Ambulatory Visit: Payer: Self-pay | Admitting: Family

## 2019-02-01 ENCOUNTER — Other Ambulatory Visit: Payer: Self-pay | Admitting: Family

## 2019-02-03 ENCOUNTER — Other Ambulatory Visit: Payer: Self-pay | Admitting: Family

## 2019-02-06 ENCOUNTER — Encounter: Payer: Self-pay | Admitting: Family Medicine

## 2019-02-06 ENCOUNTER — Other Ambulatory Visit: Payer: Self-pay

## 2019-02-06 ENCOUNTER — Ambulatory Visit (INDEPENDENT_AMBULATORY_CARE_PROVIDER_SITE_OTHER): Payer: Medicare HMO | Admitting: Family Medicine

## 2019-02-06 ENCOUNTER — Encounter: Payer: Medicare HMO | Admitting: Family Medicine

## 2019-02-06 DIAGNOSIS — R300 Dysuria: Secondary | ICD-10-CM | POA: Diagnosis not present

## 2019-02-06 NOTE — Progress Notes (Signed)
Virtual Visit via Video Note  I connected with [name] Pratt on 02/06/19 at  2:30 PM EDT by a video enabled telemedicine application and verified that I am speaking with the correct person using two identifiers.  Location: Patient: home Provider: home    I discussed the limitations of evaluation and management by telemedicine and the availability of in person appointments. The patient expressed understanding and agreed to proceed.---- we had to tr  History of Present Illness:   pt is home c/o dysuria that has started again.  He was in the er for pyleonephritis earlier this month and he is concerned it is coming back.  No fevers, no abd pain-- just some "twinges" of pain with urination.    Past Medical History:  Diagnosis Date  . Arthritis   . Cancer ([place])    skin cancer  . Carotid stenosis    a. Carotid U/S 8/85: LICA < 02%, RICA 77-41%;  b.  Carotid U/S 2/87:  RICA 86-76%; LICA 7-20%; f/u 1 year  . Chest pain     Myoview in 2008 was normal.  Echo 7/09: EF 60%, normal wall motion, mild LVH, mild LAE.  [name] Complication of anesthesia    "stomach does not wake up"  . COPD (chronic obstructive pulmonary disease) ([place])   . GERD (gastroesophageal reflux disease)   . Gout   . History of chicken pox   . History of hemorrhoids   . History of kidney stones   . Hyperlipidemia    under control  . Hypertension    under control  . Hypothyroidism   . Ileus ([place])   . Inguinal hernia   . OSA (obstructive sleep apnea)   . Pancreatitis   . PONV (postoperative nausea and vomiting)   . Rosacea   . 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   Current Outpatient Medications on File Prior to Visit  Medication Sig Dispense Refill  . albuterol (PROVENTIL HFA;VENTOLIN HFA) 108 (90 Base) MCG/ACT inhaler Inhale 2 puffs into the lungs every 6 (six) hours as needed for shortness of breath. 3 Inhaler 1  . allopurinol (ZYLOPRIM) 100 MG tablet Take 1 tablet  (100 mg total) by mouth daily. 90 tablet 1  . allopurinol (ZYLOPRIM) 300 MG tablet TAKE 1 TABLET EVERY DAY 90 tablet 1  . amLODipine (NORVASC) 10 MG tablet TAKE 1 TABLET EVERY DAY 90 tablet 0  . aspirin EC 81 MG tablet Take 81 mg by mouth every morning.    . Carboxymethylcellul-Glycerin (OPTIVE) 0.5-0.9 % SOLN Apply 1 drop to eye daily.    . cycloSPORINE (RESTASIS) 0.05 % ophthalmic emulsion Place 1 drop into both eyes 2 (two) times daily.    . diclofenac sodium (VOLTAREN) 1 % GEL Apply 1 application topically daily as needed (arthritis). 100 g 2  . diphenhydrAMINE (BENADRYL) 25 MG tablet Take 25 mg by mouth every 6 (six) hours as needed for allergies.    . fenofibrate (TRICOR) 145 MG tablet Take 1 tablet (145 mg total) by mouth daily. 90 tablet 1  . gabapentin (NEURONTIN) 300 MG capsule TAKE 1 CAPSULE AT BEDTIME 90 capsule 1  . hydrALAZINE (APRESOLINE) 50 MG tablet Take 1 tablet (50 mg total) by mouth 3 (three) times daily. 270 tablet 1  . IRON PO Take 50 mcg by mouth daily.    [name] levothyroxine (SYNTHROID, LEVOTHROID) 150 MCG tablet TAKE 1 TABLET ONE TIME DAILY 6 DAYS PER WEEK AND 1/2 TABLET  1 DAY A WEEK 78 tablet 1  . losartan-hydrochlorothiazide (HYZAAR) 100-25 MG tablet Take 1 tablet by mouth daily. Please keep upcoming appt with Dr. Johnsie Cancel in June for future refills. Thank you 90 tablet 0  . multivitamin (THERAGRAN) per tablet Take 1 tablet by mouth daily.      . nitroGLYCERIN (NITROSTAT) 0.4 MG SL tablet Place 1 tablet (0.4 mg total) under the tongue every 5 (five) minutes as needed for chest pain. 25 tablet 3  . Omega-3 Fatty Acids (FISH OIL PO) Take 1 capsule by mouth 2 (two) times daily.    . rosuvastatin (CRESTOR) 5 MG tablet TAKE 1 TABLET (5 MG TOTAL) BY MOUTH DAILY. 90 tablet 1  . sodium chloride (OCEAN) 0.65 % nasal spray Place 1 spray into both nostrils at bedtime as needed for congestion.    . traMADol (ULTRAM) 50 MG tablet Take 1 tablet (50 mg total) by mouth every 8 (eight) hours  as needed. 15 tablet 0  . Zoster Vaccine Adjuvanted [facility]) injection Inject 0.5mg  IM now and again in 2-6 months. 0.5 mL 1   Current Facility-Administered Medications on File Prior to Visit  Medication Dose Route Frequency Provider Last Rate Last Dose  . cloNIDine (CATAPRES) tablet 0.1 mg  0.1 mg Oral Once [name], NP        Observations/Objective: No vitals obtained  Afebrile Pt in NAD  Assessment and Plan: 1. Dysuria With pt hx we will have him come in and we will do ua and culture  He is to call if symptoms worsen or go to ER - POCT Urinalysis Dipstick (Automated); Future   Follow Up Instructions:    I discussed the assessment and treatment plan with the patient. The patient was provided an opportunity to ask questions and all were answered. The patient agreed with the plan and demonstrated an understanding of the instructions.   The patient was advised to call back or seek an in-person evaluation if the symptoms worsen or if the condition fails to improve as anticipated.  I provided 10 minutes of non-face-to-face time during this encounter.   Ann Held, DO

## 2019-02-06 NOTE — Progress Notes (Signed)
Virtual Visit via Video Note  I connected with [name] Pratt on 02/06/19 at  1:30 PM EDT by a video enabled telemedicine application and verified that I am speaking with the correct person using two identifiers.  Location: Patient: home Provider: home   I discussed the limitations of evaluation and management by telemedicine and the availability of in person appointments. The patient expressed understanding and agreed to proceed.  History of Present Illness:    Observations/Objective:   Assessment and Plan: Pt called by cma and I sent text through [email or web address]----  No response   Follow Up Instructions:    I discussed the assessment and treatment plan with the patient. The patient was provided an opportunity to ask questions and all were answered. The patient agreed with the plan and demonstrated an understanding of the instructions.   The patient was advised to call back or seek an in-person evaluation if the symptoms worsen or if the condition fails to improve as anticipated.  Ann Held, DO   This encounter was created in error - please disregard.

## 2019-02-07 ENCOUNTER — Other Ambulatory Visit (INDEPENDENT_AMBULATORY_CARE_PROVIDER_SITE_OTHER): Payer: Medicare HMO

## 2019-02-07 ENCOUNTER — Other Ambulatory Visit: Payer: Self-pay

## 2019-02-07 DIAGNOSIS — R300 Dysuria: Secondary | ICD-10-CM

## 2019-02-07 LAB — POC URINALSYSI DIPSTICK (AUTOMATED)
Bilirubin, UA: NEGATIVE
Blood, UA: NEGATIVE
Glucose, UA: NEGATIVE
Ketones, UA: NEGATIVE
Leukocytes, UA: NEGATIVE
Nitrite, UA: NEGATIVE
Protein, UA: NEGATIVE
Spec Grav, UA: 1.015 (ref 1.010–1.025)
Urobilinogen, UA: 0.2 E.U./dL
pH, UA: 6 (ref 5.0–8.0)

## 2019-02-10 ENCOUNTER — Other Ambulatory Visit: Payer: Medicare HMO

## 2019-02-24 ENCOUNTER — Telehealth: Payer: Self-pay

## 2019-02-24 NOTE — Telephone Encounter (Signed)
Spoke with wife who states patient is not available at the time and will call our office back about appt with Dr. Johnsie Cancel on 6/22 to do a virtual visit ([email or web address]).

## 2019-02-26 ENCOUNTER — Ambulatory Visit: Payer: Medicare HMO | Admitting: Family

## 2019-03-03 ENCOUNTER — Ambulatory Visit: Payer: Medicare HMO | Admitting: Cardiovascular Disease

## 2019-03-04 ENCOUNTER — Ambulatory Visit (INDEPENDENT_AMBULATORY_CARE_PROVIDER_SITE_OTHER): Payer: Medicare HMO | Admitting: Family

## 2019-03-04 ENCOUNTER — Telehealth: Payer: Self-pay | Admitting: Family

## 2019-03-04 ENCOUNTER — Encounter: Payer: Self-pay | Admitting: Family

## 2019-03-04 ENCOUNTER — Other Ambulatory Visit: Payer: Self-pay

## 2019-03-04 VITALS — BP 139/57 | HR 67 | Temp 97.9°F | Resp 16 | Ht 65.5 in | Wt 162.0 lb

## 2019-03-04 DIAGNOSIS — E785 Hyperlipidemia, unspecified: Secondary | ICD-10-CM | POA: Diagnosis not present

## 2019-03-04 DIAGNOSIS — I10 Essential (primary) hypertension: Secondary | ICD-10-CM

## 2019-03-04 DIAGNOSIS — M109 Gout, unspecified: Secondary | ICD-10-CM | POA: Diagnosis not present

## 2019-03-04 DIAGNOSIS — R739 Hyperglycemia, unspecified: Secondary | ICD-10-CM

## 2019-03-04 LAB — BASIC METABOLIC PANEL
BUN: 37 mg/dL — ABNORMAL HIGH (ref 6–23)
CO2: 26 mEq/L (ref 19–32)
Calcium: 8.9 mg/dL (ref 8.4–10.5)
Chloride: 103 mEq/L (ref 96–112)
Creatinine, Ser: 1.83 mg/dL — ABNORMAL HIGH (ref 0.40–1.50)
GFR: 35.88 mL/min — ABNORMAL LOW (ref >60.00)
Glucose, Bld: 153 mg/dL — ABNORMAL HIGH (ref 70–99)
Potassium: 3.6 mEq/L (ref 3.5–5.1)
Sodium: 138 mEq/L (ref 135–145)

## 2019-03-04 LAB — HEMOGLOBIN A1C: Hgb A1c MFr Bld: 5.6 % (ref 4.6–6.5)

## 2019-03-04 MED ORDER — DOCUSATE SODIUM 100 MG PO CAPS: 100.0000 mg | ORAL_CAPSULE | Freq: Two times a day (BID) | ORAL | 0 refills | Status: DC | PRN

## 2019-03-04 MED ORDER — COLCHICINE 0.6 MG PO TABS: 0.6000 mg | ORAL_TABLET | Freq: Every day | ORAL | 3 refills | Status: DC

## 2019-03-04 MED ORDER — FLUTICASONE PROPIONATE 50 MCG/ACT NA SUSP: 2.0000 | Freq: Every day | NASAL | 6 refills | Status: DC | PRN

## 2019-03-04 NOTE — Telephone Encounter (Signed)
Please contact patient and let him know that I reviewed his previous lab work and due to his decreased kidney function, I would recommend that we decrease his allopurinol down to 100mg  once daily (he should stop the 300mg  tabs) and add colchicine one tab once daily. I would like to see him back in 3 months instead of 6 please. rx has been sent.

## 2019-03-04 NOTE — Progress Notes (Signed)
Subjective:    Patient ID: [name] Pratt, male    DOB: 23-Dec-1940, 78 y.o.   MRN: [id number]  HPI  Patient is a 78 yr old male who presents.  Reports that he continues to have intermittent constipation.   Gout- continues allopurinol 400mg  once daily. Denies recent gout flare.  HTN- maintained on amlodipine and hydralazine. BP Readings from Last 3 Encounters:  03/04/19 (!) 139/57  01/16/19 119/63  01/03/19 135/68   Hyperlipidemia- continues fenofibrate and crestor.  Lab Results  Component Value Date   CHOL 109 06/17/2018   HDL 38.00 (L) 06/17/2018   LDLCALC 53 06/17/2018   TRIG 93.0 06/17/2018   CHOLHDL 3 06/17/2018      Review of Systems See HPI  Past Medical History:  Diagnosis Date  . Arthritis   . Cancer ([place])    skin cancer  . Carotid stenosis    a. Carotid U/S 3/29: LICA < 51%, RICA 88-41%;  b.  Carotid U/S 6/60:  RICA 63-01%; LICA 6-01%; f/u 1 year  . Chest pain     Myoview in 2008 was normal.  Echo 7/09: EF 60%, normal wall motion, mild LVH, mild LAE.  [name] Complication of anesthesia    "stomach does not wake up"  . COPD (chronic obstructive pulmonary disease) ([place])   . GERD (gastroesophageal reflux disease)   . Gout   . History of chicken pox   . History of hemorrhoids   . History of kidney stones   . Hyperlipidemia    under control  . Hypertension    under control  . Hypothyroidism   . Ileus ([place])   . Inguinal hernia   . OSA (obstructive sleep apnea)   . Pancreatitis   . PONV (postoperative nausea and vomiting)   . Rosacea   . 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     Social History   Socioeconomic History  . Marital status: Married    Spouse name: Not on file  . Number of children: 1  . Years of education: Not on file  . Highest education level: Not on file  Occupational History  . 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 Financial resource strain:  Not on file  . Food insecurity    Worry: Not on file    Inability: Not on file  . Transportation needs    Medical: Not on file    Non-medical: Not on file  Tobacco Use  . Smoking status: Former Smoker    Years: 11.00    Types: Cigarettes    Quit date: 09/11/1978    Years since quitting: 40.5  . Smokeless tobacco: Never Used  Substance and Sexual Activity  . Alcohol use: Yes    Alcohol/week: 0.0 standard drinks    Comment: rare  . Drug use: No  . Sexual activity: Not on file  Lifestyle  . Physical activity    Days per week: Not on file    Minutes per session: Not on file  . Stress: Not on file  Relationships  . Social Herbalist on phone: Not on file    Gets together: Not on file    Attends religious service: Not on file    Active member of club or organization: Not on file    Attends meetings of clubs or organizations: Not on file    Relationship status: Not on file  .  Intimate partner violence    Fear of current or ex partner: Not on file    Emotionally abused: Not on file    Physically abused: Not on file    Forced sexual activity: Not on file  Other Topics Concern  . Not on file  Social History Narrative   Lives with his wife   He has one son- lives locally.   2 grandchildren   Has worked for [employer], trade [employer]   Enjoys wood working   Completed HS.  [employer]    Past Surgical History:  Procedure Laterality Date  . ABDOMINAL HERNIA REPAIR  07/15/12   Dr [name]  . BROW LIFT  05/07/01  . CYSTOSCOPY WITH URETEROSCOPY AND STENT PLACEMENT Right 01/28/2014   Procedure: CYSTOSCOPY WITH URETEROSCOPY, BASKET RETRIVAL AND  STENT PLACEMENT;  Surgeon: [name], MD;  Location: [facility];  Service: Urology;  Laterality: Right;  . epidural injections     multiple procedures  . [place]   multiple times  . EYE SURGERY Bilateral 03/25/10, 2012   cataract removal  . HEMORRHOID SURGERY  2014  . HIATAL HERNIA REPAIR  12/21/93  . HOLMIUM  LASER APPLICATION Right 2/97/9892   Procedure: HOLMIUM LASER APPLICATION;  Surgeon: [name], MD;  Location: [facility];  Service: Urology;  Laterality: Right;  . INGUINAL HERNIA REPAIR Right 07/15/12   Dr [name], x2  . JOINT REPLACEMENT  07/25/04   right knee  . JOINT REPLACEMENT  07/13/10   left knee  . KNEE SURGERY  1995  . LAPAROSCOPIC CHOLECYSTECTOMY  09/20/06   with intraoperative cholangiogram and right inguinal herniorrhaphy with mesh   . LIGAMENT REPAIR Left 07/2013   shoulder  . LITHOTRIPSY     x2  . NASAL SEPTUM SURGERY  1975  . REFRACTIVE SURGERY Bilateral   . 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 SKIN CANCER DESTRUCTION     nose, ear  . TONSILLECTOMY  as child  . TOTAL HIP ARTHROPLASTY Left   . URETHRAL DILATION  1991   [name]    Family History  Problem Relation Age of Onset  . Cancer Mother   . Hypertension Other   . Cancer Other   . Osteoarthritis Other     Allergies  Allergen Reactions  . Metoclopramide Hcl     anxiety  . Pravastatin     Muscle cramps  . Sildenafil     Tachycardia      Current Outpatient Medications on File Prior to Visit  Medication Sig Dispense Refill  . albuterol (PROVENTIL HFA;VENTOLIN HFA) 108 (90 Base) MCG/ACT inhaler Inhale 2 puffs into the lungs every 6 (six) hours as needed for shortness of breath. 3 Inhaler 1  . allopurinol (ZYLOPRIM) 100 MG tablet Take 1 tablet (100 mg total) by mouth daily. 90 tablet 1  . allopurinol (ZYLOPRIM) 300 MG tablet TAKE 1 TABLET EVERY DAY 90 tablet 1  . amLODipine (NORVASC) 10 MG tablet TAKE 1 TABLET EVERY DAY 90 tablet 0  . aspirin EC 81 MG tablet Take 81 mg by mouth every morning.    . Carboxymethylcellul-Glycerin (OPTIVE) 0.5-0.9 % SOLN Apply 1 drop to eye daily.    . cycloSPORINE (RESTASIS) 0.05 % ophthalmic emulsion Place 1 drop into both eyes 2 (two) times daily.    . diclofenac sodium (VOLTAREN) 1 % GEL Apply 1 application topically daily as needed (arthritis). 100 g 2  .  fenofibrate (TRICOR) 145 MG tablet Take 1  tablet (145 mg total) by mouth daily. 90 tablet 1  . gabapentin (NEURONTIN) 300 MG capsule TAKE 1 CAPSULE AT BEDTIME 90 capsule 1  . hydrALAZINE (APRESOLINE) 50 MG tablet Take 1 tablet (50 mg total) by mouth 3 (three) times daily. 270 tablet 1  . IRON PO Take 50 mcg by mouth daily.    [name] levothyroxine (SYNTHROID, LEVOTHROID) 150 MCG tablet TAKE 1 TABLET ONE TIME DAILY 6 DAYS PER WEEK AND 1/2 TABLET 1 DAY A WEEK 78 tablet 1  . losartan-hydrochlorothiazide (HYZAAR) 100-25 MG tablet Take 1 tablet by mouth daily. Please keep upcoming appt with Dr. Johnsie Cancel in June for future refills. Thank you 90 tablet 0  . multivitamin (THERAGRAN) per tablet Take 1 tablet by mouth daily.      . nitroGLYCERIN (NITROSTAT) 0.4 MG SL tablet Place 1 tablet (0.4 mg total) under the tongue every 5 (five) minutes as needed for chest pain. 25 tablet 3  . Omega-3 Fatty Acids (FISH OIL PO) Take 1 capsule by mouth 2 (two) times daily.    . rosuvastatin (CRESTOR) 5 MG tablet TAKE 1 TABLET (5 MG TOTAL) BY MOUTH DAILY. 90 tablet 1  . sodium chloride (OCEAN) 0.65 % nasal spray Place 1 spray into both nostrils at bedtime as needed for congestion.     No current facility-administered medications on file prior to visit.     BP (!) 139/57 (BP Location: Left Arm, Patient Position: Sitting, Cuff Size: Small)   Pulse 67   Temp 97.9 F (36.6 C) (Oral)   Resp 16   Ht 5' 5.5" (1.664 m)   Wt 162 lb (73.5 kg)   SpO2 99%   BMI 26.55 kg/m       Objective:   Physical Exam Constitutional:      General: He is not in acute distress.    Appearance: He is well-developed.  HENT:     Head: Normocephalic and atraumatic.  Cardiovascular:     Rate and Rhythm: Normal rate and regular rhythm.     Heart sounds: No murmur.  Pulmonary:     Effort: Pulmonary effort is normal. No respiratory distress.     Breath sounds: Normal breath sounds. No wheezing or rales.  Skin:    General: Skin is warm and  dry.  Neurological:     Mental Status: He is alert and oriented to person, place, and time.  Psychiatric:        Behavior: Behavior normal.        Thought Content: Thought content normal.           Assessment & Plan:  Hypertension-blood pressure stable on current medications.  Continue same.  Hyperlipidemia-tolerating statin.  Will obtain follow-up lipid panel  Mild constipation-he plans to follow-up with his urologist to discuss to discuss other medications other than Toviaz due to side effect of constipation.  In the meantime I have advised him to increase his stool softener from once daily to twice daily.  Gout- stable on current dose of allopurinol, however I am concerned about his high dose in the setting of his renal insufficiency. Will decrease his allopurinol to 100mg  once daily and add a once daily dose of colchicine.

## 2019-03-05 ENCOUNTER — Telehealth: Payer: Self-pay

## 2019-03-05 MED ORDER — COLCRYS 0.6 MG PO TABS: 0.6000 mg | ORAL_TABLET | Freq: Every day | ORAL | 3 refills | Status: DC

## 2019-03-05 NOTE — Telephone Encounter (Signed)
Generic colchicine not covered- brand Colcrys sent to [facility] [place].

## 2019-03-05 NOTE — Telephone Encounter (Signed)
Patient requesting call back from [place] regarding this medication. Patient stated that he has some additional questions and would like to discuss prior authorization.

## 2019-03-05 NOTE — Telephone Encounter (Signed)
Patient advised of lab results and medication changes. He was scheduled to come back in September for follow up. Will check with his mail order pharmacy to see if the colchicine is cheaper with them

## 2019-03-10 ENCOUNTER — Other Ambulatory Visit: Payer: Self-pay

## 2019-03-10 MED ORDER — COLCRYS 0.6 MG PO TABS: 0.6000 mg | ORAL_TABLET | Freq: Every day | ORAL | 0 refills | Status: DC

## 2019-03-10 NOTE — Telephone Encounter (Signed)
Called but no answer, lvm for patient to call back 

## 2019-03-10 NOTE — Telephone Encounter (Signed)
Talked to patient earlier today, rx was sent for colchicine to [facility] at his request

## 2019-03-26 ENCOUNTER — Encounter: Payer: Self-pay | Admitting: Neurology

## 2019-03-26 ENCOUNTER — Ambulatory Visit: Payer: Self-pay | Admitting: Family

## 2019-03-26 NOTE — Telephone Encounter (Signed)
Summary: advise   Pt has lost weight by almost 30lbs in the last 6 months and wants to know if there is something he can do to help him stop losing weight / Pt mention his kidney function and a change in medication/ please advise      Patient states he has lost a good amount of weight. He weighs 154 lb now. He has been watching diet what he is taking in. Patient has been watching glucose and BP and they are fine. Patient reports his digestive system is more constipated than loose stool- so he does not think that is the problem. Patient is to follow up for his abnormal kidney function- but wants to know if he needs appointment before then to discuss his weight management. He is requesting an office visit since his virtual visit did not go so well. Also: Patient has leg cramps- usually in shins at night/early morning- patient is staying well hydrated.   Reason for Disposition . Nursing judgment or information in reference  Answer Assessment - Initial Assessment Questions 1. REASON FOR CALL: "What is your main concern right now?"     Weight lose 2. ONSET: "When did the weight lose start?"     3 months 3. SEVERITY: "How bad is the weight loss?"     30 lb 4. FEVER: "Do you have a fever?"     No fever 5. OTHER SYMPTOMS: "Do you have any other new symptoms?"     No other symptoms- UTI symptoms much better 6. INTERVENTIONS AND RESPONSE: "What have you done so far to try to make this better? What medications have you used?"     Patient does not want to continue to lose weight at this point- so he wants to know how to slow it down. Patient does not want to adversely effect his glucose and BP. 7. PREGNANCY: "Is there any chance you are pregnant?"     n/a  Protocols used: NO GUIDELINE AVAILABLE-A-AH

## 2019-03-27 ENCOUNTER — Other Ambulatory Visit: Payer: Self-pay

## 2019-03-27 ENCOUNTER — Encounter: Payer: Self-pay | Admitting: Neurology

## 2019-03-27 ENCOUNTER — Ambulatory Visit (INDEPENDENT_AMBULATORY_CARE_PROVIDER_SITE_OTHER): Payer: Medicare HMO | Admitting: Neurology

## 2019-03-27 VITALS — BP 133/70 | HR 64 | Temp 98.7°F | Ht 65.0 in | Wt 156.0 lb

## 2019-03-27 DIAGNOSIS — K5909 Other constipation: Secondary | ICD-10-CM

## 2019-03-27 DIAGNOSIS — G4731 Primary central sleep apnea: Secondary | ICD-10-CM | POA: Diagnosis not present

## 2019-03-27 DIAGNOSIS — N183 Chronic kidney disease, stage 3 unspecified: Secondary | ICD-10-CM

## 2019-03-27 DIAGNOSIS — J449 Chronic obstructive pulmonary disease, unspecified: Secondary | ICD-10-CM

## 2019-03-27 DIAGNOSIS — G4733 Obstructive sleep apnea (adult) (pediatric): Secondary | ICD-10-CM | POA: Diagnosis not present

## 2019-03-27 DIAGNOSIS — Z9989 Dependence on other enabling machines and devices: Secondary | ICD-10-CM

## 2019-03-27 NOTE — Patient Instructions (Signed)
We ordered a new split night study to establish your current baseline of apnea, type of apnea and hopefully can titrate to PAP the same night.   Screening for Sleep Apnea  Sleep apnea is a condition in which breathing pauses or becomes shallow during sleep. Sleep apnea screening is a test to determine if you are at risk for sleep apnea. The test is easy and only takes a few minutes. Your health care provider may ask you to have this test in preparation for surgery or as part of a physical exam. What are the symptoms of sleep apnea? Common symptoms of sleep apnea include:  Snoring.  Restless sleep.  Daytime sleepiness.  Pauses in breathing.  Choking during sleep.  Irritability.  Forgetfulness.  Trouble thinking clearly.  Depression.  Personality changes. Most people with sleep apnea are not aware that they have it. Why should I get screened? Getting screened for sleep apnea can help:  Ensure your safety. It is important for your health care providers to know whether or not you have sleep apnea, especially if you are having surgery or have other long-term (chronic) health conditions.  Improve your health and allow you to get a better night's rest. Restful sleep can help you: ? Have more energy. ? Lose weight. ? Improve high blood pressure. ? Improve diabetes management. ? Prevent stroke. ? Prevent car accidents. How is screening done? Screening usually includes being asked a list of questions about your sleep quality. Some questions you may be asked include:  Do you snore?  Is your sleep restless?  Do you have daytime sleepiness?  Has a partner or spouse told you that you stop breathing during sleep?  Have you had trouble concentrating or memory loss? If your screening test is positive, you are at risk for the condition. Further testing may be needed to confirm a diagnosis of sleep apnea. Where to find more information You can find screening tools online or at your  health care clinic. For more information about sleep apnea screening and healthy sleep, visit these websites:  Centers for Disease Control and Prevention: [employer]pl  American Sleep Apnea Association: www.sleepapnea.org Contact a health care provider if:  You think that you may have sleep apnea. Summary  Sleep apnea screening can help determine if you are at risk for sleep apnea.  It is important for your health care providers to know whether or not you have sleep apnea, especially if you are having surgery or have other chronic health conditions.  You may be asked to take a screening test for sleep apnea in preparation for surgery or as part of a physical exam. This information is not intended to replace advice given to you by your health care provider. Make sure you discuss any questions you have with your health care provider. Document Released: 12/08/2016 Document Revised: 06/14/2018 Document Reviewed: 12/08/2016 Elsevier Patient Education  2020 [employer].

## 2019-03-27 NOTE — Progress Notes (Signed)
PATIENT: [name] Pratt DOB: October 14, 1940  REASON FOR VISIT: follow up HISTORY FROM: patient  HISTORY OF PRESENT ILLNESS:   Rv with Mr [name] Pratt on 03-27-2019 , face to face -  [name] is a longstanding well-established patient with [place] he is 78 years old and has been followed here for complex apnea.  He has continued to lose some weight and his BMI is now 26, he looks well.  He had continued follow-up with Dr. Johnsie Pratt for carotid artery stenosis every 2 years the last report was favorable.  He has been 100% compliant user of an S9 CPAP set at 12 cm water pressure with 3 cm EPR but his residual AHI has been very high 15.4 higher than last year.  The majority of these apneas seem to be obstructive in origin and would normally respond to higher pressures.  In the past however higher CPAP pressures have led to him rise in central apneas.  He does not feel fatigued at this time fatigue severity score was endorsed at 1820 and 63 and his Epworth sleepiness score at 6 points, the geriatric depression score was endorsed at 2 out of 15 points all clinically unremarkable.  His since his S9 machine is now 79 years old it should be replaced.  But that would also mean he would have to come to the lab to undergo yet another sleep study. His main problem is air leakage and the sleep tech may be able to refit him at point of service.  He has still leg cramps, tried magnesium, has received a prescription through PCP> .    Interval history from 25 March 2018, I have the pleasure of seeing my longtime established patient [name] today, meanwhile 78 years old.  He has been a very highly compliant CPAP user his CPAP is still set to 12 cmH2O pressure with 3 cm EPR and his apnea index has varied between 5.7 and 10.  The residual apneas are mostly obstructive in nature but he has a history of complex and central apnea. He endorsed the Epworth Sleepiness Scale at 7 points fatigue severity 23  points, geriatric depression score is endorsed at only 1 out of 15 points.  The compliance reports again speaks of 100% compliance with an average use at time of 7 hours and 25 minutes, the settings as mentioned above, the residual apnea index is 8.2 this time was 7.2 of these obstructive apneas.   The apnea index however varies greatly from night to night and may be more dependent on mask fit or sleep position. airleak data a positive for a god seal and fit. The trouble is that the mask shifts when he sleeps on his side. He resumed supine sleep with FFM.  He  works in his garden.   His wife [name] Pratt)  at age 11 recovered well from her MI in May 2018.    Interval history from 03/06/2017, I have the pleasure of seeing [name] today who has again been an excellent compliant CPAP user with 100% compliance and an average user time of 7 hours and 33 minutes, CPAP is set to 12 cm water pressure with 3 cm EPR and his apnea index is 6.4. The residual apneas are mostly obstructive in nature. The patient has a history of complex and central apnea. His January download showed an AHI of 5.7 when he used 12 cm water pressure. I think we do not need to adjust it any  further. He still has a low degree of fatigue at only 19 points and an average Epworth sleepiness score of 11 points. He does not endorse a significant tendency to depression. He has hypothyroidism, controlled HTN, no other causes for EDS. No history of Asthma , but COPD.   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 Wife has OSA and was not compliant with CPAP, just had a heart attack.   Stays at 12 cm water with 3 cm EPR. Elite ResMed machine ( bare bone model )   Interval history from 08/29/2016. [name] is an established CPAP user, and frequently had high residual AHI eyes. In 2015 his AHI was 13.8 with central apneas- and his pressure on the machine was adjusted to 9 cm water. In 2016 his AHI was 8.5. Today it is back to 13.7 with high  compliance he has used the machine 100% over 4 hours nightly, with 7 hours 37 minutes on average, CPAP was set to 11 cm water with 3 cm EPR and the apnea index is 9.8. Mostly obstructive apneas. He does have one point for central apneas per hour of sleep and 8.4 obstructive apneas. I would like for him to be on an auto titrating if possible. I'm not sure his machine which is an S9 Elite can be set this way if not I will ask to adjust the pressure to 11.5 cm a tiny increase. He had tried it once at 13 cm water was highly uncomfortable. He doesn't wake up tired but he goes to sleep easier. CD  HISTORY 09/02/15: [name] is a 78 year old male with a history of obstructive sleep apnea. He returns today for a compliance download. At the last visit his pressure settings were adjusted. The patient brought his memory card with him today however we areunable to obtain a download. We will call his DME company to see if they can fax 1 over. The patient reports that he does use the CPAP nightly. He is tolerating the current pressure settings. He states that he typically goes to bed around 11 PM and arises at 7:30 AM. He does wake up during the night however he is able to go right back to sleep.urrently uses the full face mask. He states that he does not have a leak with the mask unless he turns over to his side. At that time if it does start leaking he can adjust this mass and it creates a better seal. Overall he feels that he is doingwell. His Epworth sleepiness score is 10 and fatigue severity score is 25. He returns today for an evaluation.  HISTORY 09/16/14: [name] is a 78 year old male with history of obstructive sleep Apnea. He returns today for a 30 daydownload. At the last visit his AHI was 13.8 so his pressure setting was adjusted to 9cm. He brought his machine with him today but we are unable to get a recent download. the patient assures me that his DME company changed his settings and he has been using  his machine nightly. Overall he doesn't notice a significant change with the pressure change other than sometimes the pressure is "strong" and can blow the mask off. The patient states that he feels that he is getting good sleep. If he sits down in front of a computer or tv after getting up he may tend to doze off. He still notices some daytime sleepiness. He gets about 7 hours of sleep. He does feel that the CPAP has helped.  ADDEDUM: We were able to get a download from the patient's DME: it showed an AHI of 14.4 on 9 cm H2O with EPR 2. His compliance is 90%. No leak noted. He uses his machine on average 6 hours and 40 minutes.   REVIEW OF SYSTEMS: Out of a complete 14 system review of symptoms, the patient complains only of the following symptoms, and all other reviewed systems are negative.  Fatigue, double vision, hearing loss, shortness of breath, murmur, restless leg, muscle cramps, walking difficulty, neck stiffness, itching, urgency, swollen abdomen, numbness, weakness See visit note 03-27-2019  ALLERGIES: Allergies  Allergen Reactions  . Metoclopramide Hcl     anxiety  . Pravastatin     Muscle cramps  . Sildenafil     Tachycardia      HOME MEDICATIONS: Outpatient Medications Prior to Visit  Medication Sig Dispense Refill  . albuterol (PROVENTIL HFA;VENTOLIN HFA) 108 (90 Base) MCG/ACT inhaler Inhale 2 puffs into the lungs every 6 (six) hours as needed for shortness of breath. 3 Inhaler 1  . allopurinol (ZYLOPRIM) 100 MG tablet Take 1 tablet (100 mg total) by mouth daily. 90 tablet 1  . amLODipine (NORVASC) 10 MG tablet TAKE 1 TABLET EVERY DAY 90 tablet 0  . aspirin EC 81 MG tablet Take 81 mg by mouth every morning.    . Carboxymethylcellul-Glycerin (OPTIVE) 0.5-0.9 % SOLN Apply 1 drop to eye daily.    [name] COLCRYS 0.6 MG tablet Take 1 tablet (0.6 mg total) by mouth daily. 90 tablet 0  . Cranberry 500 MG TABS Take 1 tablet by mouth daily.    . cycloSPORINE (RESTASIS) 0.05 %  ophthalmic emulsion Place 1 drop into both eyes 2 (two) times daily.    . diclofenac sodium (VOLTAREN) 1 % GEL Apply 1 application topically daily as needed (arthritis). 100 g 2  . docusate sodium (COLACE) 100 MG capsule Take 1 capsule (100 mg total) by mouth 2 (two) times daily as needed for mild constipation. 10 capsule 0  . fenofibrate (TRICOR) 145 MG tablet Take 1 tablet (145 mg total) by mouth daily. 90 tablet 1  . fluticasone (FLONASE) 50 MCG/ACT nasal spray Place 2 sprays into both nostrils daily as needed for allergies or rhinitis. 16 g 6  . gabapentin (NEURONTIN) 300 MG capsule TAKE 1 CAPSULE AT BEDTIME 90 capsule 1  . hydrALAZINE (APRESOLINE) 50 MG tablet Take 1 tablet (50 mg total) by mouth 3 (three) times daily. 270 tablet 1  . IRON PO Take 50 mcg by mouth daily.    [name] levothyroxine (SYNTHROID, LEVOTHROID) 150 MCG tablet TAKE 1 TABLET ONE TIME DAILY 6 DAYS PER WEEK AND 1/2 TABLET 1 DAY A WEEK 78 tablet 1  . losartan-hydrochlorothiazide (HYZAAR) 100-25 MG tablet Take 1 tablet by mouth daily. Please keep upcoming appt with Dr. Johnsie Pratt in June for future refills. Thank you 90 tablet 0  . multivitamin (THERAGRAN) per tablet Take 1 tablet by mouth daily.      . nitroGLYCERIN (NITROSTAT) 0.4 MG SL tablet Place 1 tablet (0.4 mg total) under the tongue every 5 (five) minutes as needed for chest pain. 25 tablet 3  . Omega-3 Fatty Acids (FISH OIL PO) Take 1 capsule by mouth 2 (two) times daily.    . rosuvastatin (CRESTOR) 5 MG tablet TAKE 1 TABLET (5 MG TOTAL) BY MOUTH DAILY. 90 tablet 1  . sodium chloride (OCEAN) 0.65 % nasal spray Place 1 spray into both nostrils at bedtime as needed for congestion.    [name]  MYRBETRIQ 25 MG TB24 tablet      No facility-administered medications prior to visit.     PAST MEDICAL HISTORY: Past Medical History:  Diagnosis Date  . Arthritis   . Cancer ([place])    skin cancer  . Carotid stenosis    a. Carotid U/S 4/65: LICA < 68%, RICA 12-75%;  b.  Carotid U/S 5/14:   RICA 17-00%; LICA 1-74%; f/u 1 year  . Chest pain     Myoview in 2008 was normal.  Echo 7/09: EF 60%, normal wall motion, mild LVH, mild LAE.  [name] Complication of anesthesia    "stomach does not wake up"  . COPD (chronic obstructive pulmonary disease) ([place])   . GERD (gastroesophageal reflux disease)   . Gout   . History of chicken pox   . History of hemorrhoids   . History of kidney stones   . Hyperlipidemia    under control  . Hypertension    under control  . Hypothyroidism   . Ileus ([place])   . Inguinal hernia   . OSA (obstructive sleep apnea)   . Pancreatitis   . PONV (postoperative nausea and vomiting)   . Rosacea   . 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    PAST SURGICAL HISTORY: Past Surgical History:  Procedure Laterality Date  . ABDOMINAL HERNIA REPAIR  07/15/12   Dr [name]  . BROW LIFT  05/07/01  . CYSTOSCOPY WITH URETEROSCOPY AND STENT PLACEMENT Right 01/28/2014   Procedure: CYSTOSCOPY WITH URETEROSCOPY, BASKET RETRIVAL AND  STENT PLACEMENT;  Surgeon: [name], MD;  Location: [facility];  Service: Urology;  Laterality: Right;  . epidural injections     multiple procedures  . [place]   multiple times  . EYE SURGERY Bilateral 03/25/10, 2012   cataract removal  . HEMORRHOID SURGERY  2014  . HIATAL HERNIA REPAIR  12/21/93  . HOLMIUM LASER APPLICATION Right 9/44/9675   Procedure: HOLMIUM LASER APPLICATION;  Surgeon: [name], MD;  Location: [facility];  Service: Urology;  Laterality: Right;  . INGUINAL HERNIA REPAIR Right 07/15/12   Dr [name], x2  . JOINT REPLACEMENT  07/25/04   right knee  . JOINT REPLACEMENT  07/13/10   left knee  . KNEE SURGERY  1995  . LAPAROSCOPIC CHOLECYSTECTOMY  09/20/06   with intraoperative cholangiogram and right inguinal herniorrhaphy with mesh   . LIGAMENT REPAIR Left 07/2013   shoulder  . LITHOTRIPSY     x2  . NASAL SEPTUM SURGERY  1975  . REFRACTIVE SURGERY  Bilateral   . 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 SKIN CANCER DESTRUCTION     nose, ear  . TONSILLECTOMY  as child  . TOTAL HIP ARTHROPLASTY Left   . URETHRAL DILATION  1991   [name]    FAMILY HISTORY: Family History  Problem Relation Age of Onset  . Cancer Mother   . Hypertension Other   . Cancer Other   . Osteoarthritis Other     SOCIAL HISTORY: Social History   Socioeconomic History  . Marital status: Married    Spouse name: Not on file  . Number of children: 1  . Years of education: Not on file  . Highest education level: Not on file  Occupational History  . 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 Financial resource strain: Not on file  .  Food insecurity    Worry: Not on file    Inability: Not on file  . Transportation needs    Medical: Not on file    Non-medical: Not on file  Tobacco Use  . Smoking status: Former Smoker    Years: 11.00    Types: Cigarettes    Quit date: 09/11/1978    Years since quitting: 40.5  . Smokeless tobacco: Never Used  Substance and Sexual Activity  . Alcohol use: Yes    Alcohol/week: 0.0 standard drinks    Comment: rare  . Drug use: No  . Sexual activity: Not on file  Lifestyle  . Physical activity    Days per week: Not on file    Minutes per session: Not on file  . Stress: Not on file  Relationships  . Social Herbalist on phone: Not on file    Gets together: Not on file    Attends religious service: Not on file    Active member of club or organization: Not on file    Attends meetings of clubs or organizations: Not on file    Relationship status: Not on file  . Intimate partner violence    Fear of current or ex partner: Not on file    Emotionally abused: Not on file    Physically abused: Not on file    Forced sexual activity: Not on file  Other Topics Concern  . Not on file  Social History Narrative   Lives with his wife   He has one son- lives locally.   2  grandchildren   Has worked for Research officer, trade union   Enjoys wood working   Completed HS.  [employer]      PHYSICAL EXAM  Vitals:   03/27/19 1106  BP: 133/70  Pulse: 64  Temp: 98.7 F (37.1 C)  Weight: 156 lb (70.8 kg)  Height: 5\' 5"  (1.651 m)   Body mass index is 25.96 kg/m.  Generalized: Well developed, in no acute distress  Neck: Neck circumference 16.0 inches, Mallampati 2+ No wheezing ! hoarse voice, nasal speech pattern. .  Dry cough, non productive, dry mouth , sinus congestion.   Neurological examination  Mentation: Alert oriented to time, place, history taking. Follows all commands speech and language fluent Cranial nerve II-XII: Taste and smell are affected- he attributes this to medication- since 2017 .   Ischemic retinal disease.  Pupils were equal round reactive to light. 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  Facial sensation and strength were normal. Uvula and  tongue move in midline. Head turning and shoulder shrug  were normal and symmetric. He reports neck stiffness in AM but it does go away.  Motor:  symmetric motor tone is noted throughout.  DIAGNOSTIC DATA (LABS, IMAGING, TESTING) - I reviewed patient records, labs, notes, testing and imaging myself where available.  Lab Results  Component Value Date   WBC 8.2 01/16/2019   HGB 11.3 (L) 01/16/2019   HCT 34.8 (L) 01/16/2019   MCV 98.9 01/16/2019   PLT 375 01/16/2019      Component Value Date/Time   NA 138 03/04/2019 1104   K 3.6 03/04/2019 1104   CL 103 03/04/2019 1104   CO2 26 03/04/2019 1104   GLUCOSE 153 (H) 03/04/2019 1104   BUN 37 (H) 03/04/2019 1104   CREATININE 1.83 (H) 03/04/2019 1104   CREATININE 1.44 (H) 09/15/2015 0959   CALCIUM 8.9 03/04/2019 1104  PROT 7.2 01/16/2019 1752   ALBUMIN 3.6 01/16/2019 1752   AST 30 01/16/2019 1752   ALT 20 01/16/2019 1752   ALKPHOS 34 (L) 01/16/2019 1752   BILITOT 0.5 01/16/2019 1752   GFRNONAA 29 (L)  01/16/2019 1752   GFRNONAA 73 11/25/2012 1103   GFRAA 34 (L) 01/16/2019 1752   GFRAA 84 11/25/2012 1103   Lab Results  Component Value Date   CHOL 109 06/17/2018   HDL 38.00 (L) 06/17/2018   LDLCALC 53 06/17/2018   TRIG 93.0 06/17/2018   CHOLHDL 3 06/17/2018   Lab Results  Component Value Date   HGBA1C 5.6 03/04/2019   Lab Results  Component Value Date   VITAMINB12 334 06/19/2016   Lab Results  Component Value Date   TSH 2.75 04/22/2018      ASSESSMENT AND PLAN 78 y.o. 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  He is treated g for Gout and has CKD 3. Dr Hollie Salk is his nephrologist.     1. Complex-mosly Obstructive sleep apnea on CPAP,  Elevated residual AHI persisted . Has still felt better since using CPAP at 12 cm water, I would have liked to increase pressure- he lost weight again - now BMI 26 kg/m2.   Marland KitchenHe had central apneas at higher pressures. Stays at 12 mc water with 3 cm EPR and FFM, sleeps supine . Uses a body pillow.    Overall the patient is doing well. He has 100% compliance with the CPAP.  He has a further elevated AHI that has been his baseline. We will make no adjustments today We have tried CPAP auto titration 7-12 cm water in January 2018 and his AHI was  5.7 at a 95% at 12 cm water.   I will repeat a sleep study , SPLIT and with the goal to qualify him for BIPAP as needed.  He will follow-up in 2-3 months with me, Dr. Stefan Church, MD  03/27/2019, 11:24 AM  [facility] [address] [place], [place] [address] [phone]

## 2019-04-08 ENCOUNTER — Encounter: Payer: Self-pay | Admitting: Family

## 2019-04-08 ENCOUNTER — Ambulatory Visit (INDEPENDENT_AMBULATORY_CARE_PROVIDER_SITE_OTHER): Payer: Medicare HMO | Admitting: Family

## 2019-04-08 ENCOUNTER — Other Ambulatory Visit: Payer: Self-pay

## 2019-04-08 VITALS — BP 132/62 | HR 60 | Temp 98.2°F | Resp 16 | Wt 154.8 lb

## 2019-04-08 DIAGNOSIS — R634 Abnormal weight loss: Secondary | ICD-10-CM | POA: Diagnosis not present

## 2019-04-08 DIAGNOSIS — M109 Gout, unspecified: Secondary | ICD-10-CM

## 2019-04-08 NOTE — Patient Instructions (Signed)
Please complete lab work prior to leaving. Continue to weigh regularly at home. Call if you develop further weight loss between now and your next appointment in 1 month.

## 2019-04-08 NOTE — Progress Notes (Signed)
Subjective:    Patient ID: [name] Pratt, male    DOB: 06/07/1941, 78 y.o.   MRN: [id number]  HPI  Patient is a 78 yr old male who presents today with two concerns:  1) Weight loss- weight back in February was 177 lbs. Reports that he is eating "heavy". Feels like most of his weight loss came as a result of pyelonephritis illness back in May. Wt Readings from Last 3 Encounters:  04/08/19 154 lb 12.8 oz (70.2 kg)  03/27/19 156 lb (70.8 kg)  03/04/19 162 lb (73.5 kg)   Still having LE cramping in his legs at night. "like an aching in the bone."  Denies gout symptoms.    Review of Systems    see HPI  Past Medical History:  Diagnosis Date  . Arthritis   . Cancer ([place])    skin cancer  . Carotid stenosis    a. Carotid U/S 6/23: LICA < 76%, RICA 28-31%;  b.  Carotid U/S 5/17:  RICA 61-60%; LICA 7-37%; f/u 1 year  . Chest pain     Myoview in 2008 was normal.  Echo 7/09: EF 60%, normal wall motion, mild LVH, mild LAE.  [name] Complication of anesthesia    "stomach does not wake up"  . COPD (chronic obstructive pulmonary disease) ([place])   . GERD (gastroesophageal reflux disease)   . Gout   . History of chicken pox   . History of hemorrhoids   . History of kidney stones   . Hyperlipidemia    under control  . Hypertension    under control  . Hypothyroidism   . Ileus ([place])   . Inguinal hernia   . OSA (obstructive sleep apnea)   . Pancreatitis   . PONV (postoperative nausea and vomiting)   . Rosacea   . 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     Social History   Socioeconomic History  . Marital status: Married    Spouse name: Not on file  . Number of children: 1  . Years of education: Not on file  . Highest education level: Not on file  Occupational History  . 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 Financial resource strain: Not on file  . Food insecurity    Worry: Not on file   Inability: Not on file  . Transportation needs    Medical: Not on file    Non-medical: Not on file  Tobacco Use  . Smoking status: Former Smoker    Years: 11.00    Types: Cigarettes    Quit date: 09/11/1978    Years since quitting: 40.6  . Smokeless tobacco: Never Used  Substance and Sexual Activity  . Alcohol use: Yes    Alcohol/week: 0.0 standard drinks    Comment: rare  . Drug use: No  . Sexual activity: Not on file  Lifestyle  . Physical activity    Days per week: Not on file    Minutes per session: Not on file  . Stress: Not on file  Relationships  . Social Herbalist on phone: Not on file    Gets together: Not on file    Attends religious service: Not on file    Active member of club or organization: Not on file    Attends meetings of clubs or organizations: Not on file    Relationship status: Not on file  .  Intimate partner violence    Fear of current or ex partner: Not on file    Emotionally abused: Not on file    Physically abused: Not on file    Forced sexual activity: Not on file  Other Topics Concern  . Not on file  Social History Narrative   Lives with his wife   He has one son- lives locally.   2 grandchildren   Has worked for [employer], trade [employer]   Enjoys wood working   Completed HS.  [employer]    Past Surgical History:  Procedure Laterality Date  . ABDOMINAL HERNIA REPAIR  07/15/12   Dr [name]  . BROW LIFT  05/07/01  . CYSTOSCOPY WITH URETEROSCOPY AND STENT PLACEMENT Right 01/28/2014   Procedure: CYSTOSCOPY WITH URETEROSCOPY, BASKET RETRIVAL AND  STENT PLACEMENT;  Surgeon: [name], MD;  Location: [facility];  Service: Urology;  Laterality: Right;  . epidural injections     multiple procedures  . [place]   multiple times  . EYE SURGERY Bilateral 03/25/10, 2012   cataract removal  . HEMORRHOID SURGERY  2014  . HIATAL HERNIA REPAIR  12/21/93  . HOLMIUM LASER APPLICATION Right 2/59/5638   Procedure: HOLMIUM LASER  APPLICATION;  Surgeon: [name], MD;  Location: [facility];  Service: Urology;  Laterality: Right;  . INGUINAL HERNIA REPAIR Right 07/15/12   Dr [name], x2  . JOINT REPLACEMENT  07/25/04   right knee  . JOINT REPLACEMENT  07/13/10   left knee  . KNEE SURGERY  1995  . LAPAROSCOPIC CHOLECYSTECTOMY  09/20/06   with intraoperative cholangiogram and right inguinal herniorrhaphy with mesh   . LIGAMENT REPAIR Left 07/2013   shoulder  . LITHOTRIPSY     x2  . NASAL SEPTUM SURGERY  1975  . REFRACTIVE SURGERY Bilateral   . 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 SKIN CANCER DESTRUCTION     nose, ear  . TONSILLECTOMY  as child  . TOTAL HIP ARTHROPLASTY Left   . URETHRAL DILATION  1991   [name]    Family History  Problem Relation Age of Onset  . Cancer Mother   . Hypertension Other   . Cancer Other   . Osteoarthritis Other     Allergies  Allergen Reactions  . Metoclopramide Hcl     anxiety  . Pravastatin     Muscle cramps  . Sildenafil     Tachycardia      Current Outpatient Medications on File Prior to Visit  Medication Sig Dispense Refill  . albuterol (PROVENTIL HFA;VENTOLIN HFA) 108 (90 Base) MCG/ACT inhaler Inhale 2 puffs into the lungs every 6 (six) hours as needed for shortness of breath. 3 Inhaler 1  . allopurinol (ZYLOPRIM) 100 MG tablet Take 1 tablet (100 mg total) by mouth daily. 90 tablet 1  . amLODipine (NORVASC) 10 MG tablet TAKE 1 TABLET EVERY DAY 90 tablet 0  . aspirin EC 81 MG tablet Take 81 mg by mouth every morning.    . Carboxymethylcellul-Glycerin (OPTIVE) 0.5-0.9 % SOLN Apply 1 drop to eye daily.    [name] COLCRYS 0.6 MG tablet Take 1 tablet (0.6 mg total) by mouth daily. 90 tablet 0  . Cranberry 500 MG TABS Take 1 tablet by mouth daily.    . cycloSPORINE (RESTASIS) 0.05 % ophthalmic emulsion Place 1 drop into both eyes 2 (two) times daily.    . diclofenac sodium (VOLTAREN) 1 % GEL Apply 1 application topically  daily as needed (arthritis). 100 g 2  . docusate  sodium (COLACE) 100 MG capsule Take 1 capsule (100 mg total) by mouth 2 (two) times daily as needed for mild constipation. 10 capsule 0  . fenofibrate (TRICOR) 145 MG tablet Take 1 tablet (145 mg total) by mouth daily. 90 tablet 1  . fluticasone (FLONASE) 50 MCG/ACT nasal spray Place 2 sprays into both nostrils daily as needed for allergies or rhinitis. 16 g 6  . gabapentin (NEURONTIN) 300 MG capsule TAKE 1 CAPSULE AT BEDTIME 90 capsule 1  . hydrALAZINE (APRESOLINE) 50 MG tablet Take 1 tablet (50 mg total) by mouth 3 (three) times daily. 270 tablet 1  . IRON PO Take 50 mcg by mouth daily.    [name] levothyroxine (SYNTHROID, LEVOTHROID) 150 MCG tablet TAKE 1 TABLET ONE TIME DAILY 6 DAYS PER WEEK AND 1/2 TABLET 1 DAY A WEEK 78 tablet 1  . losartan-hydrochlorothiazide (HYZAAR) 100-25 MG tablet Take 1 tablet by mouth daily. Please keep upcoming appt with Dr. Johnsie Cancel in June for future refills. Thank you 90 tablet 0  . multivitamin (THERAGRAN) per tablet Take 1 tablet by mouth daily.      [name] MYRBETRIQ 25 MG TB24 tablet     . nitroGLYCERIN (NITROSTAT) 0.4 MG SL tablet Place 1 tablet (0.4 mg total) under the tongue every 5 (five) minutes as needed for chest pain. 25 tablet 3  . Omega-3 Fatty Acids (FISH OIL PO) Take 1 capsule by mouth 2 (two) times daily.    . rosuvastatin (CRESTOR) 5 MG tablet TAKE 1 TABLET (5 MG TOTAL) BY MOUTH DAILY. 90 tablet 1  . sodium chloride (OCEAN) 0.65 % nasal spray Place 1 spray into both nostrils at bedtime as needed for congestion.     No current facility-administered medications on file prior to visit.     BP 132/62 (BP Location: Right Arm, Patient Position: Sitting, Cuff Size: Small)   Pulse 60   Temp 98.2 F (36.8 C) (Oral)   Resp 16   Wt 154 lb 12.8 oz (70.2 kg)   SpO2 97%   BMI 25.76 kg/m    Objective:   Physical Exam Constitutional:      General: He is not in acute distress.    Appearance: He is well-developed.  HENT:     Head: Normocephalic and atraumatic.   Cardiovascular:     Rate and Rhythm: Normal rate and regular rhythm.     Heart sounds: No murmur.  Pulmonary:     Effort: Pulmonary effort is normal. No respiratory distress.     Breath sounds: Normal breath sounds. No wheezing or rales.  Abdominal:     General: Bowel sounds are normal.     Palpations: Abdomen is soft.     Tenderness: There is no abdominal tenderness.  Skin:    General: Skin is warm and dry.  Neurological:     Mental Status: He is alert and oriented to person, place, and time.  Psychiatric:        Behavior: Behavior normal.        Thought Content: Thought content normal.           Assessment & Plan:  Weight loss-New.  likely related to acute illness. Will obtain baseline lab work. Pt is advised to  Continue to weigh regularly at home. Call if you develop further weight loss between now and your next appointment in 1 month.   Would plan further imaging such as CT chest/abdomen pelvis if he  has further significant weight loss.   Gout- he is tolerating decreased dose of allopurinol. Will check follow up uric acid level.

## 2019-04-09 LAB — COMPREHENSIVE METABOLIC PANEL
ALT: 27 U/L (ref 0–53)
AST: 38 U/L — ABNORMAL HIGH (ref 0–37)
Albumin: 4.2 g/dL (ref 3.5–5.2)
Alkaline Phosphatase: 41 U/L (ref 39–117)
BUN: 32 mg/dL — ABNORMAL HIGH (ref 6–23)
CO2: 28 mEq/L (ref 19–32)
Calcium: 9.7 mg/dL (ref 8.4–10.5)
Chloride: 104 mEq/L (ref 96–112)
Creatinine, Ser: 1.67 mg/dL — ABNORMAL HIGH (ref 0.40–1.50)
GFR: 39.87 mL/min — ABNORMAL LOW (ref >60.00)
Glucose, Bld: 97 mg/dL (ref 70–99)
Potassium: 3.9 mEq/L (ref 3.5–5.1)
Sodium: 141 mEq/L (ref 135–145)
Total Bilirubin: 0.5 mg/dL (ref 0.2–1.2)
Total Protein: 7.4 g/dL (ref 6.0–8.3)

## 2019-04-09 LAB — CBC WITH DIFFERENTIAL/PLATELET
Basophils Absolute: 0.1 10*3/uL (ref 0.0–0.1)
Basophils Relative: 1.1 % (ref 0.0–3.0)
Eosinophils Absolute: 0.1 10*3/uL (ref 0.0–0.7)
Eosinophils Relative: 2.6 % (ref 0.0–5.0)
HCT: 36.3 % — ABNORMAL LOW (ref 39.0–52.0)
Hemoglobin: 11.9 g/dL — ABNORMAL LOW (ref 13.0–17.0)
Lymphocytes Relative: 26.6 % (ref 12.0–46.0)
Lymphs Abs: 1.5 10*3/uL (ref 0.7–4.0)
MCHC: 32.8 g/dL (ref 30.0–36.0)
MCV: 98 fl (ref 78.0–100.0)
Monocytes Absolute: 0.6 10*3/uL (ref 0.1–1.0)
Monocytes Relative: 10.4 % (ref 3.0–12.0)
Neutro Abs: 3.3 10*3/uL (ref 1.4–7.7)
Neutrophils Relative %: 59.3 % (ref 43.0–77.0)
Platelets: 381 10*3/uL (ref 150.0–400.0)
RBC: 3.7 Mil/uL — ABNORMAL LOW (ref 4.22–5.81)
RDW: 14.3 % (ref 11.5–15.5)
WBC: 5.6 10*3/uL (ref 4.0–10.5)

## 2019-04-09 LAB — TSH: TSH: 0.99 u[IU]/mL (ref 0.35–4.50)

## 2019-04-09 LAB — URIC ACID: Uric Acid, Serum: 6.7 mg/dL (ref 4.0–7.8)

## 2019-04-22 ENCOUNTER — Other Ambulatory Visit (HOSPITAL_COMMUNITY)
Admission: RE | Admit: 2019-04-22 | Discharge: 2019-04-22 | Disposition: A | Discharge status: Home / Self Care | Payer: Medicare HMO | Source: Ambulatory Visit | Attending: Neurology | Admitting: Neurology

## 2019-04-22 ENCOUNTER — Other Ambulatory Visit: Payer: Self-pay | Admitting: Family

## 2019-04-22 DIAGNOSIS — G4739 Other sleep apnea: Secondary | ICD-10-CM | POA: Insufficient documentation

## 2019-04-22 DIAGNOSIS — G4733 Obstructive sleep apnea (adult) (pediatric): Secondary | ICD-10-CM | POA: Diagnosis not present

## 2019-04-22 DIAGNOSIS — N183 Chronic kidney disease, stage 3 (moderate): Secondary | ICD-10-CM | POA: Diagnosis not present

## 2019-04-22 DIAGNOSIS — Z20828 Contact with and (suspected) exposure to other viral communicable diseases: Secondary | ICD-10-CM | POA: Diagnosis not present

## 2019-04-22 DIAGNOSIS — K5909 Other constipation: Secondary | ICD-10-CM | POA: Insufficient documentation

## 2019-04-22 DIAGNOSIS — Z01812 Encounter for preprocedural laboratory examination: Secondary | ICD-10-CM | POA: Insufficient documentation

## 2019-04-22 LAB — SARS CORONAVIRUS 2 (TAT 6-24 HRS): SARS Coronavirus 2: NEGATIVE

## 2019-04-25 ENCOUNTER — Ambulatory Visit (INDEPENDENT_AMBULATORY_CARE_PROVIDER_SITE_OTHER): Payer: Medicare HMO | Admitting: Neurology

## 2019-04-25 ENCOUNTER — Other Ambulatory Visit: Payer: Self-pay

## 2019-04-25 DIAGNOSIS — Z9989 Dependence on other enabling machines and devices: Secondary | ICD-10-CM

## 2019-04-25 DIAGNOSIS — G4731 Primary central sleep apnea: Secondary | ICD-10-CM

## 2019-04-25 DIAGNOSIS — K5909 Other constipation: Secondary | ICD-10-CM

## 2019-04-25 DIAGNOSIS — J449 Chronic obstructive pulmonary disease, unspecified: Secondary | ICD-10-CM

## 2019-04-25 DIAGNOSIS — G4733 Obstructive sleep apnea (adult) (pediatric): Secondary | ICD-10-CM

## 2019-04-25 DIAGNOSIS — N183 Chronic kidney disease, stage 3 unspecified: Secondary | ICD-10-CM

## 2019-04-30 NOTE — Addendum Note (Signed)
Addended by: Larey Seat on: 04/30/2019 02:13 PM   Modules accepted: Orders

## 2019-04-30 NOTE — Procedures (Signed)
PATIENT'S NAME:  [name] Pratt, [name] Pratt  DOB:      [birth year]      MR#:    [id number]     DATE OF RECORDING: 04/25/2019 REFERRING M.D.:  [name], NP Study Performed:   Titration to positive airway pressure  HISTORY:  Mr. Densil Ottey. Diekman is a longstanding well-established patient with our sleep clinic. He is 78 years old and has been followed here for complex apnea.  He has continued to lose some weight and his BMI is now 26, he looks well. He had continued follow-up with Dr. Johnsie Cancel for carotid artery stenosis.  He has been 100% compliant user of an S9 CPAP set at 12 cm water pressure with 3 cm EPR but his residual AHI has been very high 15.4 higher than last year.  The majority of these apneas seem to be obstructive in origin and would normally respond to higher pressures.  In the past however higher CPAP pressures have led to a rise in central apneas.  He does not feel fatigued at this time fatigue severity score was endorsed at 18-20 / 63 and his Epworth sleepiness score at 6/24 points, the geriatric depression score was endorsed at 2 out of 15 points- all clinically unremarkable.  Since his S9 machine is now 79 years old it should be replaced.  But that would also mean he would have to come to the lab to undergo yet another sleep study.  His main problem is air leakage and the sleep tech may be able to refit him at point of service.  He has still leg cramps, tried magnesium, has received a prescription through PCP.    The patient endorsed the Epworth Sleepiness Scale at 6 points.    The patient's weight 154 pounds with a height of 56 (inches), resulting in a BMI of 26 kg/m2.  The patient's neck circumference measured 16 inches.  CURRENT MEDICATIONS: Albuterol, Allopurinol, Norvasc, ASA 81mg , Colcrys, Cranberry, Restasis, Voltaren, Colace, Tricor, Flonase, Neurontin, Apresoline, Iron, Levothyroxine, Hyzaar, Multivitamin, Nitrostat, Fish Oil, Crestor, Myrbetriq    PROCEDURE:  This is a  multichannel digital polysomnogram utilizing the SomnoStar 11.2 system.  Electrodes and sensors were applied and monitored per AASM Specifications.   EEG, EOG, Chin and Limb EMG, were sampled at 200 Hz.  ECG, Snore and Nasal Pressure, Thermal Airflow, Respiratory Effort, CPAP Flow and Pressure, Oximetry was sampled at 50 Hz. Digital video and audio were recorded.       CPAP was initiated at 10 cmH20 with heated humidity per AASM split night standards and pressure was advanced to 14 cmH20 because of hypopneas, apneas and desaturations.  At a PAP pressure of 14 cmH20, with EPR of 2, there was a reduction of the AHI to 0.0 with improvement of sleep apnea. The patient used his familiar FFM, the ResMed AirFit F 20 but in small size.   Lights Out was at 21:29 and Lights On at 04:44. Total recording time (TRT) was 435 minutes, with a total sleep time (TST) of 340.5 minutes. The patient's sleep latency was 18.5 minutes. REM latency was 186.5 minutes.  The sleep efficiency was 78.3 %.    SLEEP ARCHITECTURE: WASO (Wake after sleep onset) was 75 minutes.  There were 12 minutes in Stage N1, 245.5 minutes Stage N2, 64.5 minutes Stage N3 and 18.5 minutes in Stage REM.  The percentage of Stage N1 was 3.5%, Stage N2 was 72.1%, Stage N3 was 18.9% and Stage R (REM sleep) was 5.4%.   RESPIRATORY ANALYSIS:  There was a total of 8 respiratory events: 0 apneas and 8 hypopneas with 0 respiratory event related arousals (RERAs).     The total APNEA/HYPOPNEA INDEX  (AHI) was 1.4 /h. 0 events occurred in REM sleep and 8 events in NREM. The REM AHI was 0 /hour versus a non-REM AHI of 1.5 /hour.  The patient spent 251.5 minutes of total sleep time in the supine position and 89 minutes in non-supine. The supine AHI was 1.0, versus a non-supine AHI of 2.7.  OXYGEN SATURATION & C02:  The baseline 02 saturation was 97%, with the lowest being 93%. Time spent below 89% saturation equaled 0 minutes. The arousals were noted as: 23 were  spontaneous, 4 were associated with PLMs, and only 4 were associated with respiratory events. The patient had a total of 107 Periodic Limb Movements. The Periodic Limb Movement (PLM) index was 18.9 and the PLM Arousal index was 0.7 /hour.  Audio and video analysis did not show any abnormal or unusual movements, behaviors, phonations or vocalizations.  PLMs in REM sleep were not noted. The patient took no bathroom breaks. He was snoring under 10 and 11 cm water pressure, but higher CPAP eliminated snores. He reported leg cramping, EKG was in keeping with normal sinus rhythm (NSR). Post-study, the patient indicated that sleep was the same as usual.   DIAGNOSIS 1. Obstructive Sleep Apnea responding well to 14 cm water pressure CPAP with 2 cm EPR.  2. Mild Periodic Limb Movement Disorder, part of his symptoms were leg cramps.    PLANS/RECOMMENDATIONS:  The patient will start using an autotitration capable CPAP machine with a setting from 8 through 14 cm water with 2 cm EPR, and his FFM F 20 in small size  1. CPAP therapy compliance is defined as 4 hours of nightly use.  A follow up appointment will be scheduled in the Sleep Clinic at [facility].   Please call [phone] with any questions.    I certify that I have reviewed the entire raw data recording prior to the issuance of this report in accordance with the Standards of Accreditation of the American Academy of Sleep Medicine (AASM)      Larey Seat, M.D. Diplomat, Tax adviser of Psychiatry and Neurology  Diplomat, Tax adviser of Sleep Medicine Market researcher, [employer] [facility] at [employer]

## 2019-05-01 ENCOUNTER — Telehealth: Payer: Self-pay | Admitting: Neurology

## 2019-05-01 DIAGNOSIS — N2581 Secondary hyperparathyroidism of renal origin: Secondary | ICD-10-CM | POA: Diagnosis not present

## 2019-05-01 DIAGNOSIS — R7309 Other abnormal glucose: Secondary | ICD-10-CM | POA: Diagnosis not present

## 2019-05-01 DIAGNOSIS — R252 Cramp and spasm: Secondary | ICD-10-CM | POA: Diagnosis not present

## 2019-05-01 DIAGNOSIS — I129 Hypertensive chronic kidney disease with stage 1 through stage 4 chronic kidney disease, or unspecified chronic kidney disease: Secondary | ICD-10-CM | POA: Diagnosis not present

## 2019-05-01 DIAGNOSIS — Z1159 Encounter for screening for other viral diseases: Secondary | ICD-10-CM | POA: Diagnosis not present

## 2019-05-01 DIAGNOSIS — N183 Chronic kidney disease, stage 3 (moderate): Secondary | ICD-10-CM | POA: Diagnosis not present

## 2019-05-01 DIAGNOSIS — M109 Gout, unspecified: Secondary | ICD-10-CM | POA: Diagnosis not present

## 2019-05-01 DIAGNOSIS — D649 Anemia, unspecified: Secondary | ICD-10-CM | POA: Diagnosis not present

## 2019-05-01 DIAGNOSIS — Z87442 Personal history of urinary calculi: Secondary | ICD-10-CM | POA: Diagnosis not present

## 2019-05-01 NOTE — Telephone Encounter (Signed)
I called pt. I advised pt that Dr. Brett Fairy reviewed their sleep study results and found that pt was . Dr. Brett Fairy recommends that pt starts an auto CPAP. I reviewed PAP compliance expectations with the pt. Pt is agreeable to starting a CPAP. I advised pt that an order will be sent to a DME, [facility], and [facility] will call the pt within about one week after they file with the pt's insurance. [facility] will show the pt how to use the machine, fit for masks, and troubleshoot the CPAP if needed. A follow up appt was made for insurance purposes with Ward Givens on Oct 15,2020 at 18 . Pt verbalized understanding to arrive, 15 minutes early and bring their CPAP. A letter with all of this information in it will be mailed to the pt  aas a reminder. I verified with the pt that the address we have on file is correct. Pt verbalized understanding of results. Pt had no questions at this time but was encouraged to call back if questions arise. I have sent the order to [facility] and have received confirmation that they have received the order.

## 2019-05-01 NOTE — Telephone Encounter (Signed)
-----   Message from Larey Seat, MD sent at 04/30/2019  2:13 PM EDT ----- DIAGNOSIS  1. Obstructive Sleep Apnea responding well to 14 cm water  pressure CPAP with 2 cm EPR.  2. Mild Periodic Limb Movement Disorder, part of his symptoms  were leg cramps.    PLANS/RECOMMENDATIONS:   The patient will start using an autotitration capable CPAP  machine with a setting from 8 through 14 cm water with 2 cm EPR,  and his FFM F 20 in small size  1. CPAP therapy compliance is defined as 4 hours of nightly use.

## 2019-05-02 ENCOUNTER — Other Ambulatory Visit: Payer: Self-pay | Admitting: Family

## 2019-05-06 ENCOUNTER — Other Ambulatory Visit: Payer: Self-pay | Admitting: Cardiovascular Disease

## 2019-05-08 DIAGNOSIS — R252 Cramp and spasm: Secondary | ICD-10-CM | POA: Diagnosis not present

## 2019-05-09 ENCOUNTER — Telehealth: Payer: Self-pay | Admitting: Family

## 2019-05-09 ENCOUNTER — Encounter: Payer: Self-pay | Admitting: Family

## 2019-05-09 ENCOUNTER — Other Ambulatory Visit: Payer: Self-pay

## 2019-05-09 ENCOUNTER — Ambulatory Visit (INDEPENDENT_AMBULATORY_CARE_PROVIDER_SITE_OTHER): Payer: Medicare HMO | Admitting: Family

## 2019-05-09 VITALS — BP 130/57 | HR 65 | Temp 97.8°F | Resp 16 | Ht 66.5 in | Wt 154.6 lb

## 2019-05-09 DIAGNOSIS — M109 Gout, unspecified: Secondary | ICD-10-CM

## 2019-05-09 DIAGNOSIS — R634 Abnormal weight loss: Secondary | ICD-10-CM

## 2019-05-09 NOTE — Progress Notes (Signed)
Subjective:    Patient ID: [name] Pratt, male    DOB: 02-Sep-1941, 78 y.o.   MRN: [id number]  HPI   Patient is a 78 yr old male who presents today follow up and surveillance of his weight.  Last visit there was concern about weight loss. Pt reports that his appetite is good and his weight has stabilized.  Wt Readings from Last 3 Encounters:  05/09/19 154 lb 9.6 oz (70.1 kg)  04/08/19 154 lb 12.8 oz (70.2 kg)  03/27/19 156 lb (70.8 kg)   Gout- denies recent flare ups. Continues to have lower leg cramping.   BP Readings from Last 3 Encounters:  05/09/19 (!) 130/57  04/08/19 132/62  03/27/19 133/70   Lab Results  Component Value Date   HGBA1C 5.6 03/04/2019    Review of Systems   See HPI  Past Medical History:  Diagnosis Date  . Arthritis   . Cancer ([place])    skin cancer  . Carotid stenosis    a. Carotid U/S [id number]: LICA < A999333, RICA 0000000;  b.  Carotid U/S 0000000:  RICA 123456; LICA [id number]; f/u 1 year  . Chest pain     Myoview in 2008 was normal.  Echo 7/09: EF 60%, normal wall motion, mild LVH, mild LAE.  [name] Complication of anesthesia    "stomach does not wake up"  . COPD (chronic obstructive pulmonary disease) ([place])   . GERD (gastroesophageal reflux disease)   . Gout   . History of chicken pox   . History of hemorrhoids   . History of kidney stones   . Hyperlipidemia    under control  . Hypertension    under control  . Hypothyroidism   . Ileus ([place])   . Inguinal hernia   . OSA (obstructive sleep apnea)   . Pancreatitis   . PONV (postoperative nausea and vomiting)   . Rosacea   . 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     Social History   Socioeconomic History  . Marital status: Married    Spouse name: Not on file  . Number of children: 1  . Years of education: Not on file  . Highest education level: Not on file  Occupational History  . 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 Financial resource strain: Not on file  . Food insecurity    Worry: Not on file    Inability: Not on file  . Transportation needs    Medical: Not on file    Non-medical: Not on file  Tobacco Use  . Smoking status: Former Smoker    Years: 11.00    Types: Cigarettes    Quit date: 09/11/1978    Years since quitting: 40.6  . Smokeless tobacco: Never Used  Substance and Sexual Activity  . Alcohol use: Yes    Alcohol/week: 0.0 standard drinks    Comment: rare  . Drug use: No  . Sexual activity: Not on file  Lifestyle  . Physical activity    Days per week: Not on file    Minutes per session: Not on file  . Stress: Not on file  Relationships  . Social Herbalist on phone: Not on file    Gets together: Not on file    Attends religious service: Not on file    Active member of club or organization: Not on file  Attends meetings of clubs or organizations: Not on file    Relationship status: Not on file  . Intimate partner violence    Fear of current or ex partner: Not on file    Emotionally abused: Not on file    Physically abused: Not on file    Forced sexual activity: Not on file  Other Topics Concern  . Not on file  Social History Narrative   Lives with his wife   He has one son- lives locally.   2 grandchildren   Has worked for [employer], trade [employer]   Enjoys wood working   Completed HS.  [employer]    Past Surgical History:  Procedure Laterality Date  . ABDOMINAL HERNIA REPAIR  07/15/12   Dr [name]  . BROW LIFT  05/07/01  . CYSTOSCOPY WITH URETEROSCOPY AND STENT PLACEMENT Right 01/28/2014   Procedure: CYSTOSCOPY WITH URETEROSCOPY, BASKET RETRIVAL AND  STENT PLACEMENT;  Surgeon: [name], MD;  Location: [facility];  Service: Urology;  Laterality: Right;  . epidural injections     multiple procedures  . [place]   multiple times  . EYE SURGERY Bilateral 03/25/10, 2012   cataract removal  . HEMORRHOID SURGERY  2014  . HIATAL  HERNIA REPAIR  12/21/93  . HOLMIUM LASER APPLICATION Right [id number]   Procedure: HOLMIUM LASER APPLICATION;  Surgeon: [name], MD;  Location: [facility];  Service: Urology;  Laterality: Right;  . INGUINAL HERNIA REPAIR Right 07/15/12   Dr [name], x2  . JOINT REPLACEMENT  07/25/04   right knee  . JOINT REPLACEMENT  07/13/10   left knee  . KNEE SURGERY  1995  . LAPAROSCOPIC CHOLECYSTECTOMY  09/20/06   with intraoperative cholangiogram and right inguinal herniorrhaphy with mesh   . LIGAMENT REPAIR Left 07/2013   shoulder  . LITHOTRIPSY     x2  . NASAL SEPTUM SURGERY  1975  . REFRACTIVE SURGERY Bilateral   . 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 SKIN CANCER DESTRUCTION     nose, ear  . TONSILLECTOMY  as child  . TOTAL HIP ARTHROPLASTY Left   . URETHRAL DILATION  1991   [name]    Family History  Problem Relation Age of Onset  . Cancer Mother   . Hypertension Other   . Cancer Other   . Osteoarthritis Other     Allergies  Allergen Reactions  . Metoclopramide Hcl     anxiety  . Pravastatin     Muscle cramps  . Sildenafil     Tachycardia      Current Outpatient Medications on File Prior to Visit  Medication Sig Dispense Refill  . albuterol (PROVENTIL HFA;VENTOLIN HFA) 108 (90 Base) MCG/ACT inhaler Inhale 2 puffs into the lungs every 6 (six) hours as needed for shortness of breath. 3 Inhaler 1  . allopurinol (ZYLOPRIM) 100 MG tablet Take 1 tablet (100 mg total) by mouth daily. 90 tablet 1  . amLODipine (NORVASC) 10 MG tablet TAKE 1 TABLET EVERY DAY 90 tablet 0  . aspirin EC 81 MG tablet Take 81 mg by mouth every morning.    . Carboxymethylcellul-Glycerin (OPTIVE) 0.5-0.9 % SOLN Apply 1 drop to eye daily.    [name] COLCRYS 0.6 MG tablet Take 1 tablet (0.6 mg total) by mouth daily. 90 tablet 0  . Cranberry 500 MG TABS Take 1 tablet by mouth daily.    . cycloSPORINE (RESTASIS) 0.05 % ophthalmic emulsion Place 1 drop into both  eyes 2 (two) times daily.    . diclofenac sodium  (VOLTAREN) 1 % GEL Apply 1 application topically daily as needed (arthritis). 100 g 2  . docusate sodium (COLACE) 100 MG capsule Take 1 capsule (100 mg total) by mouth 2 (two) times daily as needed for mild constipation. 10 capsule 0  . fenofibrate (TRICOR) 145 MG tablet TAKE 1 TABLET EVERY DAY 90 tablet 1  . fluticasone (FLONASE) 50 MCG/ACT nasal spray Place 2 sprays into both nostrils daily as needed for allergies or rhinitis. 16 g 6  . gabapentin (NEURONTIN) 300 MG capsule TAKE 1 CAPSULE AT BEDTIME 90 capsule 1  . hydrALAZINE (APRESOLINE) 50 MG tablet Take 1 tablet (50 mg total) by mouth 3 (three) times daily. 270 tablet 1  . IRON PO Take 50 mcg by mouth daily.    [name] levothyroxine (SYNTHROID, LEVOTHROID) 150 MCG tablet TAKE 1 TABLET ONE TIME DAILY 6 DAYS PER WEEK AND 1/2 TABLET 1 DAY A WEEK 78 tablet 1  . losartan-hydrochlorothiazide (HYZAAR) 100-25 MG tablet Take 1 tablet by mouth daily. Please keep upcoming appt in September with Dr. Johnsie Cancel before anymore refills. Thank you 90 tablet 0  . multivitamin (THERAGRAN) per tablet Take 1 tablet by mouth daily.      . nitroGLYCERIN (NITROSTAT) 0.4 MG SL tablet Place 1 tablet (0.4 mg total) under the tongue every 5 (five) minutes as needed for chest pain. 25 tablet 3  . Omega-3 Fatty Acids (FISH OIL PO) Take 1 capsule by mouth 2 (two) times daily.    . rosuvastatin (CRESTOR) 5 MG tablet TAKE 1 TABLET (5 MG TOTAL) BY MOUTH DAILY. 90 tablet 1  . sodium chloride (OCEAN) 0.65 % nasal spray Place 1 spray into both nostrils at bedtime as needed for congestion.    [name] MYRBETRIQ 25 MG TB24 tablet      No current facility-administered medications on file prior to visit.     BP (!) 130/57 (BP Location: Right Arm, Patient Position: Sitting, Cuff Size: Small)   Pulse 65   Temp 97.8 F (36.6 C) (Oral)   Resp 16   Ht 5' 6.5" (1.689 m)   Wt 154 lb 9.6 oz (70.1 kg)   SpO2 97%   BMI 24.58 kg/m        Objective:   Physical Exam Constitutional:       General: He is not in acute distress.    Appearance: He is well-developed.  HENT:     Head: Normocephalic and atraumatic.  Cardiovascular:     Rate and Rhythm: Normal rate and regular rhythm.     Heart sounds: No murmur.  Pulmonary:     Effort: Pulmonary effort is normal. No respiratory distress.     Breath sounds: Normal breath sounds. No wheezing or rales.  Skin:    General: Skin is warm and dry.  Neurological:     Mental Status: He is alert and oriented to person, place, and time.  Psychiatric:        Behavior: Behavior normal.        Thought Content: Thought content normal.           Assessment & Plan:  Gout- stable. Continue allopurinol.   Weight loss- weight has now stabilized.  Monitor.

## 2019-05-09 NOTE — Telephone Encounter (Signed)
Medical office records request faxed to [facility] at [phone]

## 2019-05-09 NOTE — Telephone Encounter (Signed)
Can you please call [facility] and request last office note?

## 2019-05-13 DIAGNOSIS — H04123 Dry eye syndrome of bilateral lacrimal glands: Secondary | ICD-10-CM | POA: Diagnosis not present

## 2019-05-13 DIAGNOSIS — H18413 Arcus senilis, bilateral: Secondary | ICD-10-CM | POA: Diagnosis not present

## 2019-05-13 DIAGNOSIS — H35343 Macular cyst, hole, or pseudohole, bilateral: Secondary | ICD-10-CM | POA: Diagnosis not present

## 2019-05-13 DIAGNOSIS — Z961 Presence of intraocular lens: Secondary | ICD-10-CM | POA: Diagnosis not present

## 2019-05-14 DIAGNOSIS — L0109 Other impetigo: Secondary | ICD-10-CM | POA: Diagnosis not present

## 2019-05-14 DIAGNOSIS — L570 Actinic keratosis: Secondary | ICD-10-CM | POA: Diagnosis not present

## 2019-05-14 DIAGNOSIS — Z85828 Personal history of other malignant neoplasm of skin: Secondary | ICD-10-CM | POA: Diagnosis not present

## 2019-05-14 DIAGNOSIS — G4733 Obstructive sleep apnea (adult) (pediatric): Secondary | ICD-10-CM | POA: Diagnosis not present

## 2019-05-14 DIAGNOSIS — Z08 Encounter for follow-up examination after completed treatment for malignant neoplasm: Secondary | ICD-10-CM | POA: Diagnosis not present

## 2019-05-20 DIAGNOSIS — G4733 Obstructive sleep apnea (adult) (pediatric): Secondary | ICD-10-CM | POA: Diagnosis not present

## 2019-05-20 NOTE — Progress Notes (Signed)
Patient ID: [name] Pratt, male   DOB: September 05, 1941, 78 y.o.   MRN: [id number]     CARDIOLOGY OFFICE NOTE  Date:  05/29/2019    [name] Pratt Date of Birth: 1941/08/16 Medical Record [id number]  PCP:  [name], NP  Cardiologist:  Johnsie Cancel    No chief complaint on file.   History of Present Illness:  78 y.o. history of HLD, GERD, left bruit , HTN and COPD. No history of CAD with normal myovue in 2008 and may of 2017. Last TTE done June 2017 with EF 55-60% AV sclerosis Duplex done August of 2018 with new criteria read as plaque no stenosis bilaterally. 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 Arthritis   . Cancer ([place])    skin cancer  . Carotid stenosis    a. Carotid U/S [id number]: LICA < [id number], RICA 0000000;  b.  Carotid U/S 0000000:  RICA 123456; LICA [id number]; f/u 1 year  . Chest pain     Myoview in 2008 was normal.  Echo 7/09: EF 60%, normal wall motion, mild LVH, mild LAE.  [name] Complication of anesthesia    "stomach does not wake up"  . COPD (chronic obstructive pulmonary disease) ([place])   . GERD (gastroesophageal reflux disease)   . Gout   . History of chicken pox   . History of hemorrhoids   . History of kidney stones   . Hyperlipidemia    under control  . Hypertension    under control  . Hypothyroidism   . Ileus ([place])   . Inguinal hernia   . OSA (obstructive sleep apnea)   . Pancreatitis   . PONV (postoperative nausea and vomiting)   . Rosacea   . 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    Past Surgical History:  Procedure Laterality Date  . ABDOMINAL HERNIA REPAIR  07/15/12   Dr [name]  . BROW LIFT  05/07/01  .  CYSTOSCOPY WITH URETEROSCOPY AND STENT PLACEMENT Right 01/28/2014   Procedure: CYSTOSCOPY WITH URETEROSCOPY, BASKET RETRIVAL AND  STENT PLACEMENT;  Surgeon: [name], MD;  Location[facility];  Service: Urology;  Laterality: Right;  . epidural injections     multiple procedures  . [place]   multiple times  . EYE SURGERY Bilateral 03/25/10, 2012   cataract removal  . HEMORRHOID SURGERY  2014  . HIATAL HERNIA REPAIR  12/21/93  . HOLMIUM LASER APPLICATION Right [id number]   Procedure: HOLMIUM LASER APPLICATION;  Surgeon: [name], MD;  Location: [facility];  Service: Urology;  Laterality: Right;  . INGUINAL HERNIA REPAIR Right 07/15/12   Dr [name], x2  . JOINT REPLACEMENT  07/25/04   right knee  . JOINT REPLACEMENT  07/13/10   left knee  . KNEE SURGERY  1995  . LAPAROSCOPIC CHOLECYSTECTOMY  09/20/06   with intraoperative cholangiogram and right inguinal herniorrhaphy with mesh   . LIGAMENT REPAIR Left 07/2013   shoulder  . LITHOTRIPSY     x2  . NASAL SEPTUM SURGERY  1975  . REFRACTIVE SURGERY Bilateral   . Right total  hip replacement  4/14  . SKIN CANCER DESTRUCTION     nose, ear  . TONSILLECTOMY  as child  . TOTAL HIP ARTHROPLASTY Left   . URETHRAL DILATION  1991   [name]     Medications: Current Outpatient Medications  Medication Sig Dispense Refill  . albuterol (PROVENTIL HFA;VENTOLIN HFA) 108 (90 Base) MCG/ACT inhaler Inhale 2 puffs into the lungs every 6 (six) hours as needed for shortness of breath. 3 Inhaler 1  . allopurinol (ZYLOPRIM) 100 MG tablet Take 1 tablet (100 mg total) by mouth daily. 90 tablet 1  . amLODipine (NORVASC) 10 MG tablet TAKE 1 TABLET EVERY DAY 90 tablet 0  . aspirin EC 81 MG tablet Take 81 mg by mouth every morning.    . Carboxymethylcellul-Glycerin (OPTIVE) 0.5-0.9 % SOLN Apply 1 drop to eye daily.    [name] COLCRYS 0.6 MG tablet Take 1 tablet (0.6 mg total) by mouth daily. 90 tablet 0  . Cranberry 500 MG TABS Take 1  tablet by mouth daily.    . cycloSPORINE (RESTASIS) 0.05 % ophthalmic emulsion Place 1 drop into both eyes 2 (two) times daily.    . diclofenac sodium (VOLTAREN) 1 % GEL Apply 1 application topically daily as needed (arthritis). 100 g 2  . docusate sodium (COLACE) 100 MG capsule Take 1 capsule (100 mg total) by mouth 2 (two) times daily as needed for mild constipation. 10 capsule 0  . fenofibrate (TRICOR) 145 MG tablet TAKE 1 TABLET EVERY DAY 90 tablet 1  . fluticasone (FLONASE) 50 MCG/ACT nasal spray Place 2 sprays into both nostrils daily as needed for allergies or rhinitis. 16 g 6  . gabapentin (NEURONTIN) 300 MG capsule TAKE 1 CAPSULE AT BEDTIME 90 capsule 1  . hydrALAZINE (APRESOLINE) 50 MG tablet Take 1 tablet (50 mg total) by mouth 3 (three) times daily. 270 tablet 1  . IRON PO Take 50 mcg by mouth daily.    [name] levothyroxine (SYNTHROID, LEVOTHROID) 150 MCG tablet TAKE 1 TABLET ONE TIME DAILY 6 DAYS PER WEEK AND 1/2 TABLET 1 DAY A WEEK 78 tablet 1  . losartan-hydrochlorothiazide (HYZAAR) 100-25 MG tablet Take 1 tablet by mouth daily. Please keep upcoming appt in September with Dr. Johnsie Cancel before anymore refills. Thank you 90 tablet 0  . multivitamin (THERAGRAN) per tablet Take 1 tablet by mouth daily.      [name] MYRBETRIQ 25 MG TB24 tablet     . nitroGLYCERIN (NITROSTAT) 0.4 MG SL tablet Place 1 tablet (0.4 mg total) under the tongue every 5 (five) minutes as needed for chest pain. 25 tablet 3  . Omega-3 Fatty Acids (FISH OIL PO) Take 1 capsule by mouth 2 (two) times daily.    . rosuvastatin (CRESTOR) 5 MG tablet TAKE 1 TABLET (5 MG TOTAL) BY MOUTH DAILY. 90 tablet 1  . sodium chloride (OCEAN) 0.65 % nasal spray Place 1 spray into both nostrils at bedtime as needed for congestion.     No current facility-administered medications for this visit.     Allergies: Allergies  Allergen Reactions  . Metoclopramide Hcl     anxiety  . Pravastatin     Muscle cramps  . Sildenafil     Tachycardia       Social History: The patient  reports that he quit smoking about 40 years ago. His smoking use included cigarettes. He quit after 11.00 years of use. He has never used smokeless tobacco. He reports current alcohol use. He reports that he does  not use drugs.   Family History: The patient's family history includes Cancer in his mother and another family member; Hypertension in an other family member; Osteoarthritis in an other family member.   Review of Systems: Please see the history of present illness.   Otherwise, the review of systems is positive for none.   All other systems are reviewed and negative.   Physical Exam: VS:  BP (!) 130/58   Pulse 67   Ht 5' 6.5" (1.689 m)   Wt 154 lb 6.4 oz (70 kg)   SpO2 98%   BMI 24.55 kg/m  .  BMI Body mass index is 24.55 kg/m.  Wt Readings from Last 3 Encounters:  05/29/19 154 lb 6.4 oz (70 kg)  05/09/19 154 lb 9.6 oz (70.1 kg)  04/08/19 154 lb 12.8 oz (70.2 kg)   BP (!) 130/58   Pulse 67   Ht 5' 6.5" (1.689 m)   Wt 154 lb 6.4 oz (70 kg)   SpO2 98%   BMI 24.55 kg/m    Affect appropriate Healthy:  appears stated age 89: normal Neck supple with no adenopathy JVP normal left bruits no thyromegaly Lungs clear with no wheezing and good diaphragmatic motion Heart:  S1/S2 no murmur, no rub, gallop or click PMI normal Abdomen: benighn, BS positve, no tenderness, no AAA no bruit.  No HSM or HJR Distal pulses intact with no bruits No edema Neuro non-focal Skin warm and dry No muscular weakness Post bilateral TKR     LABORATORY DATA:  EKG:  03/11/18 SR rate 65 RBBB LAFB LVH   Lab Results  Component Value Date   WBC 5.6 04/08/2019   HGB 11.9 (L) 04/08/2019   HCT 36.3 (L) 04/08/2019   PLT 381.0 04/08/2019   GLUCOSE 97 04/08/2019   CHOL 109 06/17/2018   TRIG 93.0 06/17/2018   HDL 38.00 (L) 06/17/2018   LDLCALC 53 06/17/2018   ALT 27 04/08/2019   AST 38 (H) 04/08/2019   NA 141 04/08/2019   K 3.9 04/08/2019   CL 104  04/08/2019   CREATININE 1.67 (H) 04/08/2019   BUN 32 (H) 04/08/2019   CO2 28 04/08/2019   TSH 0.99 04/08/2019   PSA 0.29 04/22/2018   INR 1.06 12/11/2012   HGBA1C 5.6 03/04/2019    BNP (last 3 results) Recent Labs    12/22/18 1929  BNP 238.0*    ProBNP (last 3 results) No results for input(s): PROBNP in the last 8760 hours.   Other Studies Reviewed Today:   Assessment/Plan: 1. HTN -  Improved on current meds his BP cuff inaccurate adjust time of day taking meds Low sodium diet  2. HLD - on statin therapy.   3. Carotid disease -  05/26/16 Duplex 40-59%  By new criteria 04/25/17 plaque no stenosis   4. COPD with stable dyspnea.   5. 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  The patient does not have concerns regarding medicines other than what has been noted above.  The following changes have been made:  See above.  Carotid Duplex ordered    Disposition:      F.u with me in a year    Jenkins Rouge

## 2019-05-28 ENCOUNTER — Other Ambulatory Visit: Payer: Self-pay | Admitting: Family

## 2019-05-29 ENCOUNTER — Other Ambulatory Visit: Payer: Self-pay

## 2019-05-29 ENCOUNTER — Ambulatory Visit (INDEPENDENT_AMBULATORY_CARE_PROVIDER_SITE_OTHER): Payer: Medicare HMO | Admitting: Cardiovascular Disease

## 2019-05-29 ENCOUNTER — Encounter: Payer: Self-pay | Admitting: Cardiovascular Disease

## 2019-05-29 VITALS — BP 130/58 | HR 67 | Ht 66.5 in | Wt 154.4 lb

## 2019-05-29 DIAGNOSIS — I10 Essential (primary) hypertension: Secondary | ICD-10-CM

## 2019-05-29 DIAGNOSIS — J449 Chronic obstructive pulmonary disease, unspecified: Secondary | ICD-10-CM | POA: Diagnosis not present

## 2019-05-29 NOTE — Patient Instructions (Addendum)

## 2019-06-03 ENCOUNTER — Ambulatory Visit: Payer: Medicare HMO | Admitting: Family

## 2019-06-04 ENCOUNTER — Other Ambulatory Visit: Payer: Self-pay | Admitting: Family

## 2019-06-04 MED ORDER — GABAPENTIN 300 MG PO CAPS: 300.0000 mg | ORAL_CAPSULE | Freq: Every day | ORAL | 0 refills | Status: DC

## 2019-06-04 NOTE — Telephone Encounter (Signed)
Copied from [place] [phone]. Topic: Quick Communication - Rx Refill/Question >> Jun 04, 2019 11:32 AM Yvette Rack wrote: [facility] mail order pharmacy requests that pt receive a short order Rx until mail order is received  Medication: gabapentin (NEURONTIN) 300 MG capsule  Has the patient contacted their pharmacy? yes   Preferred Pharmacy (with phone number or street name): [facility] [phone] (Phone) [phone] (Fax)  Agent: Please be advised that RX refills may take up to 3 business days. We ask that you follow-up with your pharmacy.

## 2019-06-13 ENCOUNTER — Other Ambulatory Visit: Payer: Self-pay | Admitting: Family

## 2019-06-13 DIAGNOSIS — G4733 Obstructive sleep apnea (adult) (pediatric): Secondary | ICD-10-CM | POA: Diagnosis not present

## 2019-06-26 ENCOUNTER — Encounter: Payer: Self-pay | Admitting: Adult Health

## 2019-06-26 ENCOUNTER — Telehealth: Payer: Self-pay

## 2019-06-26 ENCOUNTER — Other Ambulatory Visit: Payer: Self-pay

## 2019-06-26 ENCOUNTER — Ambulatory Visit: Payer: Medicare HMO | Admitting: Adult Health

## 2019-06-26 VITALS — BP 133/67 | HR 64 | Temp 98.2°F | Ht 66.5 in | Wt 157.8 lb

## 2019-06-26 DIAGNOSIS — G4733 Obstructive sleep apnea (adult) (pediatric): Secondary | ICD-10-CM | POA: Diagnosis not present

## 2019-06-26 DIAGNOSIS — Z9989 Dependence on other enabling machines and devices: Secondary | ICD-10-CM

## 2019-06-26 NOTE — Patient Instructions (Signed)
Continue using CPAP nightly and greater than 4 hours each night °If your symptoms worsen or you develop new symptoms please let us know.  ° °

## 2019-06-26 NOTE — Telephone Encounter (Signed)
Unable to get in contact with the patient. LVM letting him know that he would need to call [facility] to get the supplies for his wife. Office number was provided in case he has any other questions or concerns.

## 2019-06-26 NOTE — Progress Notes (Signed)
PATIENT: [name] Pratt DOB: [birth year]  REASON FOR VISIT: follow up HISTORY FROM: patient  HISTORY OF PRESENT ILLNESS: Today 06/26/19:  [name] Pratt is a 78 year old male with a history of obstructive sleep apnea on CPAP.  His download indicates that he uses machine nightly for compliance of 100%.  He uses machine greater than 4 hours each night.  On average he uses his machine 7 hours and 32 minutes.  His residual AHI is 3.8 on 8 to 14 cm of water with EPR of 2.  He does not have a significant leak.  He states that the CPAP is working better for him.  He states that the new machine is much better.  He denies any new issues.  He returns today for an evaluation.  HISTORY (Copied from Dr.Dohmeier's note) 03-27-2019 , face to face -  [name] Pratt is a longstanding well-established patient with [place] he is 78 years old and has been followed here for complex apnea.  He has continued to lose some weight and his BMI is now 26, he looks well.  He had continued follow-up with Dr. Johnsie Cancel for carotid artery stenosis every 2 years the last report was favorable.  He has been 100% compliant user of an S9 CPAP set at 12 cm water pressure with 3 cm EPR but his residual AHI has been very high 15.4 higher than last year.  The majority of these apneas seem to be obstructive in origin and would normally respond to higher pressures.  In the past however higher CPAP pressures have led to him rise in central apneas.  He does not feel fatigued at this time fatigue severity score was endorsed at 1820 and 63 and his Epworth sleepiness score at 6 points, the geriatric depression score was endorsed at 2 out of 15 points all clinically unremarkable.  His since his S9 machine is now 79 years old it should be replaced.  But that would also mean he would have to come to the lab to undergo yet another sleep study. His main problem is air leakage and the sleep tech may be able to refit him at point of service.  He has  still leg cramps, tried magnesium, has received a prescription through PCP> .   REVIEW OF SYSTEMS: Out of a complete 14 system review of symptoms, the patient complains only of the following symptoms, and all other reviewed systems are negative.  Epworth sleepiness score 8 fatigue severity score 14  ALLERGIES: Allergies  Allergen Reactions  . Metoclopramide Hcl     anxiety  . Pravastatin     Muscle cramps  . Sildenafil     Tachycardia      HOME MEDICATIONS: Outpatient Medications Prior to Visit  Medication Sig Dispense Refill  . albuterol (PROVENTIL HFA;VENTOLIN HFA) 108 (90 Base) MCG/ACT inhaler Inhale 2 puffs into the lungs every 6 (six) hours as needed for shortness of breath. 3 Inhaler 1  . allopurinol (ZYLOPRIM) 100 MG tablet Take 1 tablet (100 mg total) by mouth daily. 90 tablet 1  . amLODipine (NORVASC) 10 MG tablet TAKE 1 TABLET EVERY DAY 90 tablet 0  . aspirin EC 81 MG tablet Take 81 mg by mouth every morning.    . Carboxymethylcellul-Glycerin (OPTIVE) 0.5-0.9 % SOLN Apply 1 drop to eye daily.    [name] COLCRYS 0.6 MG tablet TAKE 1 TABLET EVERY DAY 90 tablet 1  . Cranberry 500 MG TABS Take 1 tablet by mouth daily.    [name]  cycloSPORINE (RESTASIS) 0.05 % ophthalmic emulsion Place 1 drop into both eyes 2 (two) times daily.    . diclofenac sodium (VOLTAREN) 1 % GEL Apply 1 application topically daily as needed (arthritis). 100 g 2  . docusate sodium (COLACE) 100 MG capsule Take 1 capsule (100 mg total) by mouth 2 (two) times daily as needed for mild constipation. 10 capsule 0  . fenofibrate (TRICOR) 145 MG tablet TAKE 1 TABLET EVERY DAY 90 tablet 1  . fluticasone (FLONASE) 50 MCG/ACT nasal spray Place 2 sprays into both nostrils daily as needed for allergies or rhinitis. 16 g 6  . gabapentin (NEURONTIN) 300 MG capsule Take 1 capsule (300 mg total) by mouth at bedtime. 20 capsule 0  . hydrALAZINE (APRESOLINE) 50 MG tablet Take 1 tablet (50 mg total) by mouth 3 (three) times daily. 270  tablet 1  . IRON PO Take 50 mcg by mouth daily.    [name] levothyroxine (SYNTHROID, LEVOTHROID) 150 MCG tablet TAKE 1 TABLET ONE TIME DAILY 6 DAYS PER WEEK AND 1/2 TABLET 1 DAY A WEEK 78 tablet 1  . losartan-hydrochlorothiazide (HYZAAR) 100-25 MG tablet Take 1 tablet by mouth daily. Please keep upcoming appt in September with Dr. Johnsie Cancel before anymore refills. Thank you 90 tablet 0  . multivitamin (THERAGRAN) per tablet Take 1 tablet by mouth daily.      [name] MYRBETRIQ 25 MG TB24 tablet     . nitroGLYCERIN (NITROSTAT) 0.4 MG SL tablet Place 1 tablet (0.4 mg total) under the tongue every 5 (five) minutes as needed for chest pain. 25 tablet 3  . Omega-3 Fatty Acids (FISH OIL PO) Take 1 capsule by mouth 2 (two) times daily.    . rosuvastatin (CRESTOR) 5 MG tablet TAKE 1 TABLET (5 MG TOTAL) BY MOUTH DAILY. 90 tablet 1  . sodium chloride (OCEAN) 0.65 % nasal spray Place 1 spray into both nostrils at bedtime as needed for congestion.     No facility-administered medications prior to visit.     PAST MEDICAL HISTORY: Past Medical History:  Diagnosis Date  . Arthritis   . Cancer ([place])    skin cancer  . Carotid stenosis    a. Carotid U/S [id number]: LICA < [id number], RICA 0000000;  b.  Carotid U/S 0000000:  RICA 123456; LICA [id number]; f/u 1 year  . Chest pain     Myoview in 2008 was normal.  Echo 7/09: EF 60%, normal wall motion, mild LVH, mild LAE.  [name] Complication of anesthesia    "stomach does not wake up"  . COPD (chronic obstructive pulmonary disease) ([place])   . GERD (gastroesophageal reflux disease)   . Gout   . History of chicken pox   . History of hemorrhoids   . History of kidney stones   . Hyperlipidemia    under control  . Hypertension    under control  . Hypothyroidism   . Ileus ([place])   . Inguinal hernia   . OSA (obstructive sleep apnea)   . Pancreatitis   . PONV (postoperative nausea and vomiting)   . Rosacea   . 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     PAST SURGICAL HISTORY: Past Surgical History:  Procedure Laterality Date  . ABDOMINAL HERNIA REPAIR  07/15/12   Dr [name]  . BROW LIFT  05/07/01  . CYSTOSCOPY WITH URETEROSCOPY AND STENT PLACEMENT Right 01/28/2014   Procedure: CYSTOSCOPY WITH URETEROSCOPY, BASKET RETRIVAL AND  STENT PLACEMENT;  Surgeon:  [name], MD;  Location: [facility];  Service: Urology;  Laterality: Right;  . epidural injections     multiple procedures  . [place]   multiple times  . EYE SURGERY Bilateral 03/25/10, 2012   cataract removal  . HEMORRHOID SURGERY  2014  . HIATAL HERNIA REPAIR  12/21/93  . HOLMIUM LASER APPLICATION Right [id number]   Procedure: HOLMIUM LASER APPLICATION;  Surgeon: [name], MD;  Location: [facility];  Service: Urology;  Laterality: Right;  . INGUINAL HERNIA REPAIR Right 07/15/12   Dr [name], x2  . JOINT REPLACEMENT  07/25/04   right knee  . JOINT REPLACEMENT  07/13/10   left knee  . KNEE SURGERY  1995  . LAPAROSCOPIC CHOLECYSTECTOMY  09/20/06   with intraoperative cholangiogram and right inguinal herniorrhaphy with mesh   . LIGAMENT REPAIR Left 07/2013   shoulder  . LITHOTRIPSY     x2  . NASAL SEPTUM SURGERY  1975  . REFRACTIVE SURGERY Bilateral   . 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 SKIN CANCER DESTRUCTION     nose, ear  . TONSILLECTOMY  as child  . TOTAL HIP ARTHROPLASTY Left   . URETHRAL DILATION  1991   [name]    FAMILY HISTORY: Family History  Problem Relation Age of Onset  . Cancer Mother   . Hypertension Other   . Cancer Other   . Osteoarthritis Other     SOCIAL HISTORY: Social History   Socioeconomic History  . Marital status: Married    Spouse name: Not on file  . Number of children: 1  . Years of education: Not on file  . Highest education level: Not on file  Occupational History  . 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 Financial resource strain: Not on file  . Food insecurity     Worry: Not on file    Inability: Not on file  . Transportation needs    Medical: Not on file    Non-medical: Not on file  Tobacco Use  . Smoking status: Former Smoker    Years: 11.00    Types: Cigarettes    Quit date: 09/11/1978    Years since quitting: 40.8  . Smokeless tobacco: Never Used  Substance and Sexual Activity  . Alcohol use: Yes    Alcohol/week: 0.0 standard drinks    Comment: rare  . Drug use: No  . Sexual activity: Not on file  Lifestyle  . Physical activity    Days per week: Not on file    Minutes per session: Not on file  . Stress: Not on file  Relationships  . Social Herbalist on phone: Not on file    Gets together: Not on file    Attends religious service: Not on file    Active member of club or organization: Not on file    Attends meetings of clubs or organizations: Not on file    Relationship status: Not on file  . Intimate partner violence    Fear of current or ex partner: Not on file    Emotionally abused: Not on file    Physically abused: Not on file    Forced sexual activity: Not on file  Other Topics Concern  . Not on file  Social History Narrative   Lives with his wife   He has one son- lives locally.   2 grandchildren  Has worked for Research officer, trade union   Enjoys wood working   Completed HS.  [employer]      PHYSICAL EXAM  Vitals:   06/26/19 1113  BP: 133/67  Pulse: 64  Temp: 98.2 F (36.8 C)  TempSrc: Oral  Weight: 157 lb 12.8 oz (71.6 kg)  Height: 5' 6.5" (1.689 m)   Body mass index is 25.09 kg/m.  Generalized: Well developed, in no acute distress  Chest: Lungs clear to auscultation bilaterally  Neurological examination  Mentation: Alert oriented to time, place, history taking. Follows all commands speech and language fluent Cranial nerve II-XII: Extraocular movements were full, visual field were full on confrontational test Head turning and shoulder shrug  were normal and symmetric. Motor: The motor testing  reveals 5 over 5 strength of all 4 extremities. Good symmetric motor tone is noted throughout.  Sensory: Sensory testing is intact to soft touch on all 4 extremities. No evidence of extinction is noted.  Gait and station: Gait is normal.   DIAGNOSTIC DATA (LABS, IMAGING, TESTING) - I reviewed patient records, labs, notes, testing and imaging myself where available.  Lab Results  Component Value Date   WBC 5.6 04/08/2019   HGB 11.9 (L) 04/08/2019   HCT 36.3 (L) 04/08/2019   MCV 98.0 04/08/2019   PLT 381.0 04/08/2019      Component Value Date/Time   NA 141 04/08/2019 1628   K 3.9 04/08/2019 1628   CL 104 04/08/2019 1628   CO2 28 04/08/2019 1628   GLUCOSE 97 04/08/2019 1628   BUN 32 (H) 04/08/2019 1628   CREATININE 1.67 (H) 04/08/2019 1628   CREATININE 1.44 (H) 09/15/2015 0959   CALCIUM 9.7 04/08/2019 1628   PROT 7.4 04/08/2019 1628   ALBUMIN 4.2 04/08/2019 1628   AST 38 (H) 04/08/2019 1628   ALT 27 04/08/2019 1628   ALKPHOS 41 04/08/2019 1628   BILITOT 0.5 04/08/2019 1628   GFRNONAA 29 (L) 01/16/2019 1752   GFRNONAA 73 11/25/2012 1103   GFRAA 34 (L) 01/16/2019 1752   GFRAA 84 11/25/2012 1103   Lab Results  Component Value Date   CHOL 109 06/17/2018   HDL 38.00 (L) 06/17/2018   LDLCALC 53 06/17/2018   TRIG 93.0 06/17/2018   CHOLHDL 3 06/17/2018   Lab Results  Component Value Date   HGBA1C 5.6 03/04/2019   Lab Results  Component Value Date   VITAMINB12 334 06/19/2016   Lab Results  Component Value Date   TSH 0.99 04/08/2019      ASSESSMENT AND PLAN 78 y.o. 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 here with:  1. Obstructive sleep apnea on CPAP  The patient's CPAP download shows excellent compliance and good treatment of his apnea.  He is encouraged to continue using CPAP nightly and greater than 4 hours each night.  He is advised that if his symptoms worsen or he develops new symptoms he should let us know.  He will follow-up in 1 year or sooner if needed    I spent 15 minutes with the patient. 50% of this time was spent reviewing CPAP download   Ward Givens, MSN,  NP-C 06/26/2019, 1:30 PM [facility] [address], [place], [place] [address] [phone]

## 2019-07-14 DIAGNOSIS — G4733 Obstructive sleep apnea (adult) (pediatric): Secondary | ICD-10-CM | POA: Diagnosis not present

## 2019-07-17 DIAGNOSIS — G4733 Obstructive sleep apnea (adult) (pediatric): Secondary | ICD-10-CM | POA: Diagnosis not present

## 2019-07-22 ENCOUNTER — Other Ambulatory Visit: Payer: Self-pay | Admitting: Family

## 2019-07-22 DIAGNOSIS — E039 Hypothyroidism, unspecified: Secondary | ICD-10-CM

## 2019-07-29 DIAGNOSIS — H35353 Cystoid macular degeneration, bilateral: Secondary | ICD-10-CM | POA: Diagnosis not present

## 2019-07-29 DIAGNOSIS — H35073 Retinal telangiectasis, bilateral: Secondary | ICD-10-CM | POA: Diagnosis not present

## 2019-07-31 ENCOUNTER — Other Ambulatory Visit: Payer: Self-pay | Admitting: Family

## 2019-08-03 ENCOUNTER — Other Ambulatory Visit: Payer: Self-pay | Admitting: Cardiovascular Disease

## 2019-08-13 DIAGNOSIS — G4733 Obstructive sleep apnea (adult) (pediatric): Secondary | ICD-10-CM | POA: Diagnosis not present

## 2019-08-15 DIAGNOSIS — G4733 Obstructive sleep apnea (adult) (pediatric): Secondary | ICD-10-CM | POA: Diagnosis not present

## 2019-08-25 ENCOUNTER — Other Ambulatory Visit: Payer: Self-pay | Admitting: Family

## 2019-08-25 NOTE — Telephone Encounter (Signed)
Lvm for patient to call office back to move upcoming appointment on 09-09-19

## 2019-09-02 ENCOUNTER — Ambulatory Visit: Payer: Medicare HMO | Admitting: Family

## 2019-09-09 ENCOUNTER — Ambulatory Visit: Payer: Medicare HMO | Admitting: Family

## 2019-09-10 ENCOUNTER — Other Ambulatory Visit: Payer: Self-pay | Admitting: Family

## 2019-09-10 DIAGNOSIS — Z03818 Encounter for observation for suspected exposure to other biological agents ruled out: Secondary | ICD-10-CM | POA: Diagnosis not present

## 2019-09-11 ENCOUNTER — Ambulatory Visit: Payer: Self-pay | Admitting: *Deleted

## 2019-09-11 NOTE — Telephone Encounter (Signed)
He called in wanting to know if he could go ahead and take his BP medication now instead of waiting until tonight.   He has been out for 5 days.  I let him know he could take it now since his BP is elevated.   He wanted to know if he should take another one tonight at his regular time.    I let him know not to take another pill tonight.   Take his next pill tomorrow night like usual so he will be back on schedule.  He thanked me for my help.  Protocol is to call PCP within 24 hours but he got his refill so no call made.  ([employer] was late getting it to him so he got it filled at a local drug store).  Reason for Disposition . Ran out of BP medications    Ran out 5 days ago but got it today.  Answer Assessment - Initial Assessment Questions 1. BLOOD PRESSURE: "What is the blood pressure?" "Did you take at least two measurements 5 minutes apart?"     I ran out of medication for 5 days.   I got my medication today.  Can I take it now?   Should I take another pill again tonight? 2. ONSET: "When did you take your blood pressure?"     168/76 at 11:30 AM today. 3. HOW: "How did you obtain the blood pressure?" (e.g., visiting nurse, automatic home BP monitor)     Home BP kit 4. HISTORY: "Do you have a history of high blood pressure?"     Yes 5. MEDICATIONS: "Are you taking any medications for blood pressure?" "Have you missed any doses recently?"     I ran out of my BP medication for 5 days.   I just got it today a few minutes ago. 6. OTHER SYMPTOMS: "Do you have any symptoms?" (e.g., headache, chest pain, blurred vision, difficulty breathing, weakness)     Denies any symptoms. 7. PREGNANCY: "Is there any chance you are pregnant?" "When was your last menstrual period?"     N/A  Protocols used: HIGH BLOOD PRESSURE-A-AH

## 2019-09-12 ENCOUNTER — Other Ambulatory Visit: Payer: Self-pay | Admitting: Family

## 2019-09-13 DIAGNOSIS — G4733 Obstructive sleep apnea (adult) (pediatric): Secondary | ICD-10-CM | POA: Diagnosis not present

## 2019-09-15 DIAGNOSIS — C44729 Squamous cell carcinoma of skin of left lower limb, including hip: Secondary | ICD-10-CM | POA: Diagnosis not present

## 2019-09-15 DIAGNOSIS — D485 Neoplasm of uncertain behavior of skin: Secondary | ICD-10-CM | POA: Diagnosis not present

## 2019-09-19 ENCOUNTER — Other Ambulatory Visit: Payer: Self-pay

## 2019-09-19 ENCOUNTER — Encounter: Payer: Self-pay | Admitting: Family

## 2019-09-19 ENCOUNTER — Ambulatory Visit (INDEPENDENT_AMBULATORY_CARE_PROVIDER_SITE_OTHER): Payer: Medicare HMO | Admitting: Family

## 2019-09-19 VITALS — BP 145/72 | HR 60 | Temp 96.9°F | Resp 16 | Wt 160.0 lb

## 2019-09-19 DIAGNOSIS — J449 Chronic obstructive pulmonary disease, unspecified: Secondary | ICD-10-CM | POA: Diagnosis not present

## 2019-09-19 DIAGNOSIS — E039 Hypothyroidism, unspecified: Secondary | ICD-10-CM | POA: Diagnosis not present

## 2019-09-19 DIAGNOSIS — Z9989 Dependence on other enabling machines and devices: Secondary | ICD-10-CM

## 2019-09-19 DIAGNOSIS — M79673 Pain in unspecified foot: Secondary | ICD-10-CM

## 2019-09-19 DIAGNOSIS — G4733 Obstructive sleep apnea (adult) (pediatric): Secondary | ICD-10-CM | POA: Diagnosis not present

## 2019-09-19 DIAGNOSIS — E7800 Pure hypercholesterolemia, unspecified: Secondary | ICD-10-CM | POA: Diagnosis not present

## 2019-09-19 DIAGNOSIS — R739 Hyperglycemia, unspecified: Secondary | ICD-10-CM

## 2019-09-19 DIAGNOSIS — I10 Essential (primary) hypertension: Secondary | ICD-10-CM | POA: Diagnosis not present

## 2019-09-19 DIAGNOSIS — I6529 Occlusion and stenosis of unspecified carotid artery: Secondary | ICD-10-CM | POA: Diagnosis not present

## 2019-09-19 DIAGNOSIS — M109 Gout, unspecified: Secondary | ICD-10-CM

## 2019-09-19 LAB — COMPREHENSIVE METABOLIC PANEL
ALT: 17 U/L (ref 0–53)
AST: 24 U/L (ref 0–37)
Albumin: 4.4 g/dL (ref 3.5–5.2)
Alkaline Phosphatase: 47 U/L (ref 39–117)
BUN: 43 mg/dL — ABNORMAL HIGH (ref 6–23)
CO2: 30 mEq/L (ref 19–32)
Calcium: 9.8 mg/dL (ref 8.4–10.5)
Chloride: 106 mEq/L (ref 96–112)
Creatinine, Ser: 1.8 mg/dL — ABNORMAL HIGH (ref 0.40–1.50)
GFR: 36.52 mL/min — ABNORMAL LOW (ref >60.00)
Glucose, Bld: 97 mg/dL (ref 70–99)
Potassium: 4 mEq/L (ref 3.5–5.1)
Sodium: 143 mEq/L (ref 135–145)
Total Bilirubin: 0.6 mg/dL (ref 0.2–1.2)
Total Protein: 7.3 g/dL (ref 6.0–8.3)

## 2019-09-19 LAB — LIPID PANEL
Cholesterol: 120 mg/dL (ref 0–200)
HDL: 42.8 mg/dL (ref >39.00)
LDL Cholesterol: 63 mg/dL (ref 0–99)
NonHDL: 76.89
Total CHOL/HDL Ratio: 3
Triglycerides: 71 mg/dL (ref 0.0–149.0)
VLDL: 14.2 mg/dL (ref 0.0–40.0)

## 2019-09-19 LAB — TSH: TSH: 1.41 u[IU]/mL (ref 0.35–4.50)

## 2019-09-19 LAB — HEMOGLOBIN A1C: Hgb A1c MFr Bld: 5.6 % (ref 4.6–6.5)

## 2019-09-19 NOTE — Progress Notes (Signed)
Subjective:    Patient ID: [name] Pratt, male    DOB: 05-26-1941, 79 y.o.   MRN: [id number]  HPI  Patient is a 79 yr old male who presents today for follow up.  Gout-   HTN- Reports that he has had a lot going.  Has trouble getting middle hydralazine tid, usually gets 2 doses in.   BP Readings from Last 3 Encounters:  09/19/19 (!) 163/68  06/26/19 133/67  05/29/19 (!) 130/58   Hyperlipidemia-  Lab Results  Component Value Date   CHOL 109 06/17/2018   HDL 38.00 (L) 06/17/2018   LDLCALC 53 06/17/2018   TRIG 93.0 06/17/2018   CHOLHDL 3 06/17/2018   Lab Results  Component Value Date   HGBA1C 5.6 03/04/2019   HGBA1C 5.8 10/28/2018   HGBA1C 6.1 04/22/2018   Lab Results  Component Value Date   LDLCALC 53 06/17/2018   CREATININE 1.67 (H) 04/08/2019   Pt c/o that his feet turn in and he has arch pain recently.   Review of Systems See HPI  Past Medical History:  Diagnosis Date  . Arthritis   . Cancer ([place])    skin cancer  . Carotid stenosis    a. Carotid U/S [id number]: LICA < [id number], RICA 0000000;  b.  Carotid U/S 0000000:  RICA 123456; LICA [id number]; f/u 1 year  . Chest pain     Myoview in 2008 was normal.  Echo 7/09: EF 60%, normal wall motion, mild LVH, mild LAE.  [name] Complication of anesthesia    "stomach does not wake up"  . COPD (chronic obstructive pulmonary disease) ([place])   . GERD (gastroesophageal reflux disease)   . Gout   . History of chicken pox   . History of hemorrhoids   . History of kidney stones   . Hyperlipidemia    under control  . Hypertension    under control  . Hypothyroidism   . Ileus ([place])   . Inguinal hernia   . OSA (obstructive sleep apnea)   . Pancreatitis   . PONV (postoperative nausea and vomiting)   . Rosacea   . 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     Social History   Socioeconomic History  . Marital status: Married    Spouse name: Not on file  . Number of children: 1  . Years  of education: Not on file  . Highest education level: Not on file  Occupational History  . 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 Smoking status: Former Smoker    Years: 11.00    Types: Cigarettes    Quit date: 09/11/1978    Years since quitting: 41.0  . Smokeless tobacco: Never Used  Substance and Sexual Activity  . Alcohol use: Yes    Alcohol/week: 0.0 standard drinks    Comment: rare  . Drug use: No  . Sexual activity: Not on file  Other Topics Concern  . Not on file  Social History Narrative   Lives with his wife   He has one son- lives locally.   2 grandchildren   Has worked for Research officer, trade union   Enjoys wood working   Completed HS.  [employer]   Social Determinants of Radio broadcast assistant Strain:   . Difficulty of Paying Living Expenses: Not on file  Food Insecurity:   . Worried About Charity fundraiser in the Last  Year: Not on file  . Ran Out of Food in the Last Year: Not on file  Transportation Needs:   . Lack of Transportation (Medical): Not on file  . Lack of Transportation (Non-Medical): Not on file  Physical Activity:   . Days of Exercise per Week: Not on file  . Minutes of Exercise per Session: Not on file  Stress:   . Feeling of Stress : Not on file  Social Connections:   . Frequency of Communication with Friends and Family: Not on file  . Frequency of Social Gatherings with Friends and Family: Not on file  . Attends Religious Services: Not on file  . Active Member of Clubs or Organizations: Not on file  . Attends Archivist Meetings: Not on file  . Marital Status: Not on file  Intimate Partner Violence:   . Fear of Current or Ex-Partner: Not on file  . Emotionally Abused: Not on file  . Physically Abused: Not on file  . Sexually Abused: Not on file    Past Surgical History:  Procedure Laterality Date  . ABDOMINAL HERNIA REPAIR  07/15/12   Dr [name]  . BROW LIFT  05/07/01  . CYSTOSCOPY WITH  URETEROSCOPY AND STENT PLACEMENT Right 01/28/2014   Procedure: CYSTOSCOPY WITH URETEROSCOPY, BASKET RETRIVAL AND  STENT PLACEMENT;  Surgeon: [name], MD;  Location: [facility];  Service: Urology;  Laterality: Right;  . epidural injections     multiple procedures  . [place]   multiple times  . EYE SURGERY Bilateral 03/25/10, 2012   cataract removal  . HEMORRHOID SURGERY  2014  . HIATAL HERNIA REPAIR  12/21/93  . HOLMIUM LASER APPLICATION Right [id number]   Procedure: HOLMIUM LASER APPLICATION;  Surgeon: [name], MD;  Location[facility];  Service: Urology;  Laterality: Right;  . INGUINAL HERNIA REPAIR Right 07/15/12   Dr [name], x2  . JOINT REPLACEMENT  07/25/04   right knee  . JOINT REPLACEMENT  07/13/10   left knee  . KNEE SURGERY  1995  . LAPAROSCOPIC CHOLECYSTECTOMY  09/20/06   with intraoperative cholangiogram and right inguinal herniorrhaphy with mesh   . LIGAMENT REPAIR Left 07/2013   shoulder  . LITHOTRIPSY     x2  . NASAL SEPTUM SURGERY  1975  . REFRACTIVE SURGERY Bilateral   . 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 SKIN CANCER DESTRUCTION     nose, ear  . TONSILLECTOMY  as child  . TOTAL HIP ARTHROPLASTY Left   . URETHRAL DILATION  1991   [name]    Family History  Problem Relation Age of Onset  . Cancer Mother   . Hypertension Other   . Cancer Other   . Osteoarthritis Other     Allergies  Allergen Reactions  . Metoclopramide Hcl     anxiety  . Pravastatin     Muscle cramps  . Sildenafil     Tachycardia      Current Outpatient Medications on File Prior to Visit  Medication Sig Dispense Refill  . albuterol (PROVENTIL HFA;VENTOLIN HFA) 108 (90 Base) MCG/ACT inhaler Inhale 2 puffs into the lungs every 6 (six) hours as needed for shortness of breath. 3 Inhaler 1  . allopurinol (ZYLOPRIM) 100 MG tablet TAKE 1 TABLET (100 MG TOTAL) BY MOUTH DAILY. 90 tablet 1  . amLODipine (NORVASC) 10 MG tablet TAKE 1 TABLET EVERY DAY 90  tablet 0  . aspirin EC 81 MG tablet Take  81 mg by mouth every morning.    . Carboxymethylcellul-Glycerin (OPTIVE) 0.5-0.9 % SOLN Apply 1 drop to eye daily.    [name] COLCRYS 0.6 MG tablet TAKE 1 TABLET EVERY DAY 90 tablet 1  . Cranberry 500 MG TABS Take 1 tablet by mouth daily.    . cycloSPORINE (RESTASIS) 0.05 % ophthalmic emulsion Place 1 drop into both eyes 2 (two) times daily.    . diclofenac sodium (VOLTAREN) 1 % GEL Apply 1 application topically daily as needed (arthritis). 100 g 2  . docusate sodium (COLACE) 100 MG capsule Take 1 capsule (100 mg total) by mouth 2 (two) times daily as needed for mild constipation. 10 capsule 0  . fenofibrate (TRICOR) 145 MG tablet TAKE 1 TABLET EVERY DAY 90 tablet 1  . fluticasone (FLONASE) 50 MCG/ACT nasal spray Place 2 sprays into both nostrils daily as needed for allergies or rhinitis. 16 g 6  . gabapentin (NEURONTIN) 300 MG capsule Take 1 capsule (300 mg total) by mouth at bedtime. 20 capsule 0  . hydrALAZINE (APRESOLINE) 50 MG tablet TAKE 1 TABLET THREE TIMES DAILY 270 tablet 1  . IRON PO Take 50 mcg by mouth daily.    [name] levothyroxine (SYNTHROID) 150 MCG tablet TAKE 1 TABLET ONE TIME DAILY 6 DAYS PER WEEK AND 1/2 TABLET 1 DAY A WEEK 78 tablet 1  . losartan-hydrochlorothiazide (HYZAAR) 100-25 MG tablet TAKE 1 TABLET DAILY. PLEASE KEEP UPCOMING APPOINTMENT IN SEPTEMBER WITH DR. Johnsie Cancel FOR ANY MORE REFILLS. 90 tablet 1  . multivitamin (THERAGRAN) per tablet Take 1 tablet by mouth daily.      [name] MYRBETRIQ 25 MG TB24 tablet     . nitroGLYCERIN (NITROSTAT) 0.4 MG SL tablet Place 1 tablet (0.4 mg total) under the tongue every 5 (five) minutes as needed for chest pain. 25 tablet 3  . Omega-3 Fatty Acids (FISH OIL PO) Take 1 capsule by mouth 2 (two) times daily.    . rosuvastatin (CRESTOR) 5 MG tablet Take 1 tablet by mouth once daily 30 tablet 5  . sodium chloride (OCEAN) 0.65 % nasal spray Place 1 spray into both nostrils at bedtime as needed for congestion.    .  [DISCONTINUED] allopurinol (ZYLOPRIM) 100 MG tablet Take 1 tablet (100 mg total) by mouth daily. 90 tablet 1   No current facility-administered medications on file prior to visit.    BP (!) 163/68 (BP Location: Right Arm, Patient Position: Sitting, Cuff Size: Small)   Pulse 60   Temp (!) 96.9 F (36.1 C) (Temporal)   Resp 16   Wt 160 lb (72.6 kg)   SpO2 100%   BMI 25.44 kg/m       Objective:   Physical Exam Constitutional:      General: He is not in acute distress.    Appearance: He is well-developed.  HENT:     Head: Normocephalic and atraumatic.  Cardiovascular:     Rate and Rhythm: Normal rate and regular rhythm.     Heart sounds: No murmur.  Pulmonary:     Effort: Pulmonary effort is normal. No respiratory distress.     Breath sounds: Normal breath sounds. No wheezing or rales.  Skin:    General: Skin is warm and dry.  Neurological:     Mental Status: He is alert and oriented to person, place, and time.  Psychiatric:        Behavior: Behavior normal.        Thought Content: Thought content normal.  Assessment & Plan:  HTN- initial bp was elevated. Repeat bp was better. Continue current meds/doses. Monitor. Check follow up cmet  Hyperglycemia- obtain follow up A1C.  Foot pain- refer to podiatry for further evaluation.   Hyperlipidemia- tolerating statin. Continue same.  Gout- currently stable on colchicine and allopurinol. Continue same.   Hypothyroid- clinically stable on synthroid, obtain follow up TSH.   Carotid stenosis- due for follow up carotid doppler. Will order.  OSA- reports good compliance with cpap.  This is being followed by [facility] neuro.  This visit occurred during the SARS-CoV-2 public health emergency.  Safety protocols were in place, including screening questions prior to the visit, additional usage of staff PPE, and extensive cleaning of exam room while observing appropriate contact time as indicated for disinfecting  solutions.

## 2019-09-19 NOTE — Patient Instructions (Addendum)
[facility] Covid-19 vaccine scheduling:  Call [phone]  Please complete lab work prior to leaving.

## 2019-09-23 ENCOUNTER — Telehealth: Payer: Self-pay | Admitting: Family

## 2019-09-23 NOTE — Telephone Encounter (Signed)
Pt saw melissa on 09-19-2019 and per pt the medication [facility] needs [facility] mail order

## 2019-09-25 ENCOUNTER — Ambulatory Visit (INDEPENDENT_AMBULATORY_CARE_PROVIDER_SITE_OTHER): Payer: Medicare HMO

## 2019-09-25 ENCOUNTER — Other Ambulatory Visit: Payer: Self-pay

## 2019-09-25 ENCOUNTER — Encounter: Payer: Self-pay | Admitting: Podiatry

## 2019-09-25 ENCOUNTER — Ambulatory Visit: Payer: Medicare HMO | Admitting: Podiatry

## 2019-09-25 DIAGNOSIS — M722 Plantar fascial fibromatosis: Secondary | ICD-10-CM

## 2019-09-25 DIAGNOSIS — M79671 Pain in right foot: Secondary | ICD-10-CM

## 2019-09-25 DIAGNOSIS — M79672 Pain in left foot: Secondary | ICD-10-CM | POA: Diagnosis not present

## 2019-09-25 MED ORDER — COLCHICINE 0.6 MG PO CAPS: 1.0000 | ORAL_CAPSULE | Freq: Every day | ORAL | 1 refills | Status: DC

## 2019-09-25 NOTE — Telephone Encounter (Signed)
[facility] will cover Mitigare capsule.  Would you like to change?

## 2019-09-25 NOTE — Progress Notes (Signed)
Subjective:   Patient ID: [name] Pratt, male   DOB: 79 y.o.   MRN: [id number]   HPI Patient presents stating he has a lot of pain in his mid arch of both feet that is been present for a long time and he knows he has high arch feet and has been very active for a long time and patient does not smoke and still climbs ladders and does activity   Review of Systems  All other systems reviewed and are negative.       Objective:  Physical Exam Vitals and nursing note reviewed.  Constitutional:      Appearance: He is well-developed.  Pulmonary:     Effort: Pulmonary effort is normal.  Musculoskeletal:        General: Normal range of motion.  Skin:    General: Skin is warm.  Neurological:     Mental Status: He is alert.     Neurovascular status intact muscle strength found to be adequate range of motion within normal limits with patient found to have a severely high arch foot structure with a very tight medial fascial band with exquisite discomfort in the mid arch area bilateral.  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  Gave instructions for supportive shoes exercises anti-inflammatories and patient will be seen back to recheck  X-rays indicate that the patient does have high arch foot structure bilateral with no indications of arthritis or spurring

## 2019-09-25 NOTE — Telephone Encounter (Signed)
Please advise pt that rx for Mitigare has been [facility]

## 2019-09-25 NOTE — Telephone Encounter (Signed)
Left detailed message that new medication has been sent in.

## 2019-09-26 ENCOUNTER — Other Ambulatory Visit: Payer: Self-pay | Admitting: Podiatry

## 2019-09-26 DIAGNOSIS — M722 Plantar fascial fibromatosis: Secondary | ICD-10-CM

## 2019-10-14 DIAGNOSIS — N3941 Urge incontinence: Secondary | ICD-10-CM | POA: Diagnosis not present

## 2019-10-14 DIAGNOSIS — G4733 Obstructive sleep apnea (adult) (pediatric): Secondary | ICD-10-CM | POA: Diagnosis not present

## 2019-10-22 ENCOUNTER — Telehealth: Payer: Self-pay | Admitting: Family

## 2019-10-22 DIAGNOSIS — R252 Cramp and spasm: Secondary | ICD-10-CM

## 2019-10-22 NOTE — Telephone Encounter (Signed)
Please let pt know that I would like for him to have a test to check the circulation in his legs. Order is pended.

## 2019-10-22 NOTE — Telephone Encounter (Signed)
Caller Name: [name] Phone: [phone]  Pt states he is still having cramping in his legs primarily in the calves and feet. He said it is worst at night and worse if he ends up on his back that it wakes him up. Please advise.

## 2019-10-22 NOTE — Telephone Encounter (Signed)
Patient advised of provider's recommendations. He verbalized understanding. Order signed.

## 2019-10-24 ENCOUNTER — Other Ambulatory Visit: Payer: Self-pay | Admitting: Family

## 2019-11-03 ENCOUNTER — Ambulatory Visit (HOSPITAL_COMMUNITY)
Admission: RE | Admit: 2019-11-03 | Discharge: 2019-11-03 | Disposition: A | Discharge status: Home / Self Care | Payer: Medicare HMO | Source: Ambulatory Visit | Attending: Family | Admitting: Family

## 2019-11-03 ENCOUNTER — Other Ambulatory Visit: Payer: Self-pay

## 2019-11-03 DIAGNOSIS — R252 Cramp and spasm: Secondary | ICD-10-CM | POA: Diagnosis not present

## 2019-11-06 ENCOUNTER — Encounter (HOSPITAL_COMMUNITY): Payer: Medicare HMO

## 2019-11-10 ENCOUNTER — Telehealth: Payer: Self-pay | Admitting: Family

## 2019-11-10 NOTE — Telephone Encounter (Signed)
Please let pt know that his circulation test on his legs looks OK.  He should let me know if he develops pain in legs with walking.

## 2019-11-11 ENCOUNTER — Encounter: Payer: Self-pay | Admitting: Family

## 2019-11-11 DIAGNOSIS — G4733 Obstructive sleep apnea (adult) (pediatric): Secondary | ICD-10-CM | POA: Diagnosis not present

## 2019-11-11 DIAGNOSIS — M79673 Pain in unspecified foot: Secondary | ICD-10-CM

## 2019-11-11 NOTE — Telephone Encounter (Signed)
Patient reports he has pain during the night and "it wakes him up but also when walking on the side of his feet more on the left foot", but mostly during the night.

## 2019-11-12 DIAGNOSIS — G4733 Obstructive sleep apnea (adult) (pediatric): Secondary | ICD-10-CM | POA: Diagnosis not present

## 2019-11-13 ENCOUNTER — Ambulatory Visit: Payer: Medicare HMO | Admitting: Family Medicine

## 2019-11-13 ENCOUNTER — Other Ambulatory Visit: Payer: Self-pay

## 2019-11-13 ENCOUNTER — Ambulatory Visit: Payer: Self-pay

## 2019-11-13 ENCOUNTER — Encounter: Payer: Self-pay | Admitting: Family Medicine

## 2019-11-13 VITALS — BP 168/82 | HR 72 | Ht 67.0 in | Wt 160.0 lb

## 2019-11-13 DIAGNOSIS — M6688 Spontaneous rupture of other tendons, other: Secondary | ICD-10-CM | POA: Diagnosis not present

## 2019-11-13 DIAGNOSIS — D649 Anemia, unspecified: Secondary | ICD-10-CM

## 2019-11-13 DIAGNOSIS — M109 Gout, unspecified: Secondary | ICD-10-CM | POA: Diagnosis not present

## 2019-11-13 DIAGNOSIS — M66869 Spontaneous rupture of other tendons, unspecified lower leg: Secondary | ICD-10-CM

## 2019-11-13 DIAGNOSIS — M79672 Pain in left foot: Secondary | ICD-10-CM

## 2019-11-13 DIAGNOSIS — R252 Cramp and spasm: Secondary | ICD-10-CM

## 2019-11-13 HISTORY — DX: Spontaneous rupture of other tendons, unspecified lower leg: M66.869

## 2019-11-13 HISTORY — DX: Cramp and spasm: R25.2

## 2019-11-13 NOTE — Patient Instructions (Signed)
Nice to meet you Please try compression on the ankle/foot Please try ice as needed  We could try custom orthotics in the foot  I will call with the results from today   Please send me a message in [place] with any questions or updates.  Please see me back in 4 weeks.   --Dr. Raeford Razor

## 2019-11-13 NOTE — Assessment & Plan Note (Signed)
Possibly related to his anemia.  May have low ferritin which is contributing to his nightly cramps.  Could be associated with his gout.  Possibility for spinal stenosis with MRI from 2011 showing different degenerative changes in the facet joints as well as a anterior listhesis. -Uric acid, iron and ferritin. -Could consider iron infusion or gabapentin.  May need to increase allopurinol.

## 2019-11-13 NOTE — Assessment & Plan Note (Signed)
This appears to be chronically ruptured on ultrasound.  May be the source of his left foot swelling and pain.  Could have some instability with ambulation leading to the chronic swelling and pain of the arch.   -Green sport insoles with medium scaphoid pad. -Counseled on compression. -Could consider physical therapy.

## 2019-11-13 NOTE — Progress Notes (Signed)
[name] Pratt - 79 y.o. male MRN [id number]  Date of birth: 04/10/1941  SUBJECTIVE:  Including CC & ROS.  Chief Complaint  Patient presents with  . Foot Pain    left foot  . Leg Pain    bilateral leg    [name] Pratt is a 79 y.o. male that is presenting with acute on chronic left foot pain and bilateral leg cramps.  The pain is occurring in the foot over the medial midfoot.  The pain has been ongoing for roughly 6 months.  He denies any specific inciting event or trauma.  He has received injections in the plantar fascia before with limited improvement.  No prior surgery.  He also is having acute leg cramps that occur on a nightly basis.  The pain is occurring in the lateral component of each leg and is severe in nature.  He has had ankle-brachial indexes performed.  Denies any new or different activities.  Denies any numbness or tingling.  He has a history of bilateral total knee arthroplasty as well as bilateral total hip arthroplasty..  Independent review of the left foot x-ray from 1/15 shows a posterior calcaneal heel spur.  Independent review of the right foot x-ray from 1/15 shows a calcaneal heel spur.  Review of the lower extremity Doppler study from 2/22 shows noncompressible lower extremity arteries but waveforms are triphasic.  Review of Systems See HPI   HISTORY: Past Medical, Surgical, Social, and Family History Reviewed & Updated per EMR.   Pertinent Historical Findings include:  Past Medical History:  Diagnosis Date  . Arthritis   . Cancer ([place])    skin cancer  . Carotid stenosis    a. Carotid U/S [id number]: LICA < [id number], RICA 0000000;  b.  Carotid U/S 0000000:  RICA 123456; LICA [id number]; f/u 1 year  . Chest pain     Myoview in 2008 was normal.  Echo 7/09: EF 60%, normal wall motion, mild LVH, mild LAE.  [name] Complication of anesthesia    "stomach does not wake up"  . COPD (chronic obstructive pulmonary disease) ([place])   . GERD (gastroesophageal reflux disease)   . Gout     . History of chicken pox   . History of hemorrhoids   . History of kidney stones   . Hyperlipidemia    under control  . Hypertension    under control  . Hypothyroidism   . Ileus ([place])   . Inguinal hernia   . OSA (obstructive sleep apnea)   . Pancreatitis   . PONV (postoperative nausea and vomiting)   . Rosacea   . 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    Past Surgical History:  Procedure Laterality Date  . ABDOMINAL HERNIA REPAIR  07/15/12   Dr [name]  . BROW LIFT  05/07/01  . CYSTOSCOPY WITH URETEROSCOPY AND STENT PLACEMENT Right 01/28/2014   Procedure: CYSTOSCOPY WITH URETEROSCOPY, BASKET RETRIVAL AND  STENT PLACEMENT;  Surgeon: [name], MD;  Location: [facility];  Service: Urology;  Laterality: Right;  . epidural injections     multiple procedures  . [place]   multiple times  . EYE SURGERY Bilateral 03/25/10, 2012   cataract removal  . HEMORRHOID SURGERY  2014  . HIATAL HERNIA REPAIR  12/21/93  . HOLMIUM LASER APPLICATION Right [id number]   Procedure: HOLMIUM LASER APPLICATION;  Surgeon: [name], MD;  Location: [facility];  Service:  Urology;  Laterality: Right;  . INGUINAL HERNIA REPAIR Right 07/15/12   Dr [name], x2  . JOINT REPLACEMENT  07/25/04   right knee  . JOINT REPLACEMENT  07/13/10   left knee  . KNEE SURGERY  1995  . LAPAROSCOPIC CHOLECYSTECTOMY  09/20/06   with intraoperative cholangiogram and right inguinal herniorrhaphy with mesh   . LIGAMENT REPAIR Left 07/2013   shoulder  . LITHOTRIPSY     x2  . NASAL SEPTUM SURGERY  1975  . REFRACTIVE SURGERY Bilateral   . 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 SKIN CANCER DESTRUCTION     nose, ear  . TONSILLECTOMY  as child  . TOTAL HIP ARTHROPLASTY Left   . URETHRAL DILATION  1991   [name]    Family History  Problem Relation Age of Onset  . Cancer Mother   . Hypertension Other   . Cancer Other   . Osteoarthritis Other      Social History   Socioeconomic History  . Marital status: Married    Spouse name: Not on file  . Number of children: 1  . Years of education: Not on file  . Highest education level: Not on file  Occupational History  . 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 Smoking status: Former Smoker    Years: 11.00    Types: Cigarettes    Quit date: 09/11/1978    Years since quitting: 41.2  . Smokeless tobacco: Never Used  Substance and Sexual Activity  . Alcohol use: Yes    Alcohol/week: 0.0 standard drinks    Comment: rare  . Drug use: No  . Sexual activity: Not on file  Other Topics Concern  . Not on file  Social History Narrative   Lives with his wife   He has one son- lives locally.   2 grandchildren   Has worked for Research officer, trade union   Enjoys wood working   Completed HS.  [employer]   Social Determinants of Radio broadcast assistant Strain:   . Difficulty of Paying Living Expenses: Not on file  Food Insecurity:   . Worried About Charity fundraiser in the Last Year: Not on file  . Ran Out of Food in the Last Year: Not on file  Transportation Needs:   . Lack of Transportation (Medical): Not on file  . Lack of Transportation (Non-Medical): Not on file  Physical Activity:   . Days of Exercise per Week: Not on file  . Minutes of Exercise per Session: Not on file  Stress:   . Feeling of Stress : Not on file  Social Connections:   . Frequency of Communication with Friends and Family: Not on file  . Frequency of Social Gatherings with Friends and Family: Not on file  . Attends Religious Services: Not on file  . Active Member of Clubs or Organizations: Not on file  . Attends Archivist Meetings: Not on file  . Marital Status: Not on file  Intimate Partner Violence:   . Fear of Current or Ex-Partner: Not on file  . Emotionally Abused: Not on file  . Physically Abused: Not on file  . Sexually Abused: Not on file      PHYSICAL EXAM:  VS: BP (!) 168/82   Pulse 72   Ht 5\' 7"  (1.702 m)   Wt 160 lb (72.6 kg)   BMI 25.06 kg/m  Physical Exam Gen: NAD, alert, cooperative  with exam, well-appearing MSK:  Left foot: Obvious swelling when compared to the contralateral side. Pes cavus foot. Limited dorsiflexion. Normal strength resistance. Neurovascularly intact.  Limited ultrasound: Left foot/ankle:  No effusion noted in the ankle joint. Normal-appearing posterior tibialis. Mild degenerative changes appreciated of the midfoot. There appears to be a chronic rupture of the anterior tibialis.  Normal insertion of the posterior tibialis into the navicular. Normal measurement of the plantar fascia.  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  May be the source of his left foot swelling and pain.  Could have some instability with ambulation leading to the chronic swelling and pain of the arch.   -Green sport insoles with medium scaphoid pad. -Counseled on compression. -Could consider physical therapy.  Leg cramps Possibly related to his anemia.  May have low ferritin which is contributing to his nightly cramps.  Could be associated with his gout.  Possibility for spinal stenosis with MRI from 2011 showing different degenerative changes in the facet joints as well as a anterior listhesis. -Uric acid, iron and ferritin. -Could consider iron infusion or gabapentin.  May need to increase allopurinol.

## 2019-11-14 ENCOUNTER — Telehealth: Payer: Self-pay | Admitting: Family Medicine

## 2019-11-14 ENCOUNTER — Encounter: Payer: Self-pay | Admitting: Family Medicine

## 2019-11-14 LAB — IRON,TIBC AND FERRITIN PANEL
Ferritin: 491 ng/mL — ABNORMAL HIGH (ref 30–400)
Iron Saturation: 33 % (ref 15–55)
Iron: 105 ug/dL (ref 38–169)
Total Iron Binding Capacity: 323 ug/dL (ref 250–450)
UIBC: 218 ug/dL (ref 111–343)

## 2019-11-14 LAB — URIC ACID: Uric Acid: 7.4 mg/dL (ref 3.8–8.4)

## 2019-11-14 MED ORDER — ALLOPURINOL 300 MG PO TABS: 300.0000 mg | ORAL_TABLET | Freq: Every day | ORAL | 1 refills | Status: DC

## 2019-11-14 NOTE — Telephone Encounter (Signed)
Left VM for patient. If he calls back please have him speak with a nurse/CMA and inform that his uric acid is still elevated. We should increase his allopurinol. His iron levels are normal. We could consider starting gabapentin for his cramps.   If any questions then please take the best time and phone number to call and I will try to call him back.   Rosemarie Ax, MD [facility] 11/14/2019, 8:17 AM

## 2019-11-17 ENCOUNTER — Telehealth: Payer: Self-pay | Admitting: Family

## 2019-11-17 NOTE — Telephone Encounter (Signed)
Please advise, patient states that he talking: gabapentin (NEURONTIN) 300 MG capsule Patient was referred to another doctor and states that he can not understand how is suppose to take his medication now.

## 2019-11-18 NOTE — Progress Notes (Signed)
Patient ID: [name] Pratt, male   DOB: September 09, 1941, 79 y.o.   MRN: [id number]     CARDIOLOGY OFFICE NOTE  Date:  12/02/2019    [name] Pratt Date of Birth: 1940/10/18 Medical Record [id number]  PCP:  [name], NP  Cardiologist:  Johnsie Cancel    No chief complaint on file.   History of Present Illness:  79 y.o. history of HLD, GERD, left bruit , HTN and COPD. No history of CAD with normal myovue in 2008 and may of 2017. Last TTE done June 2017 with EF 55-60% AV sclerosis Duplex done August of 2018 with new criteria read as plaque no stenosis bilaterally. 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   03/11/18 Atypical fleeting sharp chest pains Not exertional more relieved with his inhaler And he thinks its related to his COPD no further issues  March had UTI  Has lost 30 lbs Left foot swelling / pain with anterior tibialis tendon tear by [place] seen by Dr Raeford Razor [facility] Suggested starting gabapentin for night cramps  And increasing allopurinol for gout and elevated uric acid He had ABI"s  11/03/19 non-compressible but wave forms mostly tri phasic   Has had COVID vaccine Wife has CAD and sees [facility] cardiologist May want to transfer here No angina compliant with meds   Past Medical History:  Diagnosis Date  . Arthritis   . Cancer ([place])    skin cancer  . Carotid stenosis    a. Carotid U/S [id number]: LICA < [id number], RICA 0000000;  b.  Carotid U/S 0000000:  RICA 123456; LICA [id number]; f/u 1 year  . Chest pain     Myoview in 2008 was normal.  Echo 7/09: EF 60%, normal wall motion, mild LVH, mild LAE.  [name] Complication of anesthesia    "stomach does not wake up"  . COPD (chronic obstructive pulmonary disease) ([place])   . GERD (gastroesophageal reflux disease)   . Gout   . History of chicken pox   . History of hemorrhoids   . History of kidney stones   . Hyperlipidemia    under control  . Hypertension    under control  . Hypothyroidism   .  Ileus ([place])   . Inguinal hernia   . OSA (obstructive sleep apnea)   . Pancreatitis   . PONV (postoperative nausea and vomiting)   . Rosacea   . 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    Past Surgical History:  Procedure Laterality Date  . ABDOMINAL HERNIA REPAIR  07/15/12   Dr [name]  . BROW LIFT  05/07/01  . CYSTOSCOPY WITH URETEROSCOPY AND STENT PLACEMENT Right 01/28/2014   Procedure: CYSTOSCOPY WITH URETEROSCOPY, BASKET RETRIVAL AND  STENT PLACEMENT;  Surgeon: [name], MD;  Location[facility];  Service: Urology;  Laterality: Right;  . epidural injections     multiple procedures  . [place]   multiple times  . EYE SURGERY Bilateral 03/25/10, 2012   cataract removal  . HEMORRHOID SURGERY  2014  . HIATAL HERNIA REPAIR  12/21/93  . HOLMIUM LASER APPLICATION Right [id number]   Procedure: HOLMIUM LASER APPLICATION;  Surgeon: [name], MD;  [facility];  Service: Urology;  Laterality: Right;  . INGUINAL HERNIA REPAIR Right 07/15/12   Dr [name], x2  . JOINT REPLACEMENT  07/25/04   right knee  . JOINT REPLACEMENT  07/13/10   left knee  . KNEE SURGERY  1995  . LAPAROSCOPIC CHOLECYSTECTOMY  09/20/06   with intraoperative cholangiogram and right inguinal herniorrhaphy with mesh   . LIGAMENT REPAIR Left 07/2013   shoulder  . LITHOTRIPSY     x2  . NASAL SEPTUM SURGERY  1975  . REFRACTIVE SURGERY Bilateral   . 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 SKIN CANCER DESTRUCTION     nose, ear  . TONSILLECTOMY  as child  . TOTAL HIP ARTHROPLASTY Left   . URETHRAL DILATION  1991   [name]     Medications: Current Outpatient Medications  Medication Sig Dispense Refill  . albuterol (PROVENTIL HFA;VENTOLIN HFA) 108 (90 Base) MCG/ACT inhaler Inhale 2 puffs into the lungs every 6 (six) hours as needed for shortness of breath. 3 Inhaler 1  . allopurinol (ZYLOPRIM) 100 MG tablet Take 1 tablet (100 mg total) by  mouth 2 (two) times daily. 180 tablet 1  . amLODipine (NORVASC) 10 MG tablet TAKE 1 TABLET EVERY DAY 90 tablet 0  . aspirin EC 81 MG tablet Take 81 mg by mouth every morning.    . Carboxymethylcellul-Glycerin (OPTIVE) 0.5-0.9 % SOLN Apply 1 drop to eye daily.    . Colchicine (MITIGARE) 0.6 MG CAPS Take 1 capsule by mouth daily. 90 capsule 1  . Cranberry 500 MG TABS Take 1 tablet by mouth daily.    . cycloSPORINE (RESTASIS) 0.05 % ophthalmic emulsion Place 1 drop into both eyes 2 (two) times daily.    . diclofenac sodium (VOLTAREN) 1 % GEL Apply 1 application topically daily as needed (arthritis). 100 g 2  . docusate sodium (COLACE) 100 MG capsule Take 1 capsule (100 mg total) by mouth 2 (two) times daily as needed for mild constipation. 10 capsule 0  . fenofibrate (TRICOR) 145 MG tablet TAKE 1 TABLET EVERY DAY 90 tablet 1  . fluticasone (FLONASE) 50 MCG/ACT nasal spray Place 2 sprays into both nostrils daily as needed for allergies or rhinitis. 16 g 6  . gabapentin (NEURONTIN) 300 MG capsule Take 300 mg capsule 2 to 3 times a day as needed. 60 capsule 1  . hydrALAZINE (APRESOLINE) 50 MG tablet TAKE 1 TABLET THREE TIMES DAILY 270 tablet 1  . IRON PO Take 50 mcg by mouth daily.    [name] levothyroxine (SYNTHROID) 150 MCG tablet TAKE 1 TABLET ONE TIME DAILY 6 DAYS PER WEEK AND 1/2 TABLET 1 DAY A WEEK 78 tablet 1  . losartan-hydrochlorothiazide (HYZAAR) 100-25 MG tablet TAKE 1 TABLET DAILY. PLEASE KEEP UPCOMING APPOINTMENT IN SEPTEMBER WITH DR. Johnsie Cancel FOR ANY MORE REFILLS. 90 tablet 1  . multivitamin (THERAGRAN) per tablet Take 1 tablet by mouth daily.      [name] MYRBETRIQ 25 MG TB24 tablet     . nitroGLYCERIN (NITROSTAT) 0.4 MG SL tablet Place 1 tablet (0.4 mg total) under the tongue every 5 (five) minutes as needed for chest pain. 25 tablet 3  . Omega-3 Fatty Acids (FISH OIL PO) Take 1 capsule by mouth 2 (two) times daily.    . rosuvastatin (CRESTOR) 5 MG tablet Take 1 tablet by mouth once daily 30 tablet 5   . sodium chloride (OCEAN) 0.65 % nasal spray Place 1 spray into both nostrils at bedtime as needed for congestion.     No current facility-administered medications for this visit.    Allergies: Allergies  Allergen Reactions  . Metoclopramide Hcl     anxiety  . Pravastatin  Muscle cramps  . Sildenafil     Tachycardia      Social History: The patient  reports that he quit smoking about 41 years ago. His smoking use included cigarettes. He quit after 11.00 years of use. He has never used smokeless tobacco. He reports current alcohol use. He reports that he does not use drugs.   Family History: The patient's family history includes Cancer in his mother and another family member; Hypertension in an other family member; Osteoarthritis in an other family member.   Review of Systems: Please see the history of present illness.   Otherwise, the review of systems is positive for none.   All other systems are reviewed and negative.   Physical Exam: VS:  BP (!) 142/76   Pulse (!) 57   Ht 5\' 7"  (1.702 m)   Wt 165 lb (74.8 kg)   SpO2 96%   BMI 25.84 kg/m  .  BMI Body mass index is 25.84 kg/m.  Wt Readings from Last 3 Encounters:  12/02/19 165 lb (74.8 kg)  11/13/19 160 lb (72.6 kg)  09/19/19 160 lb (72.6 kg)   BP (!) 142/76   Pulse (!) 57   Ht 5\' 7"  (1.702 m)   Wt 165 lb (74.8 kg)   SpO2 96%   BMI 25.84 kg/m    Affect appropriate Healthy:  appears stated age HEENT: normal Neck supple with no adenopathy JVP normal left bruits no thyromegaly Lungs clear with no wheezing and good diaphragmatic motion Heart:  S1/S2 no murmur, no rub, gallop or click PMI normal Abdomen: benighn, BS positve, no tenderness, no AAA no bruit.  No HSM or HJR Distal pulses intact with no bruits No edema Neuro non-focal Skin warm and dry No muscular weakness Post bilateral TKR     LABORATORY DATA:  EKG:  12/02/19  SR rate 57 RBBB/LAFB stable   Lab Results  Component Value Date    WBC 5.6 04/08/2019   HGB 11.9 (L) 04/08/2019   HCT 36.3 (L) 04/08/2019   PLT 381.0 04/08/2019   GLUCOSE 97 09/19/2019   CHOL 120 09/19/2019   TRIG 71.0 09/19/2019   HDL 42.80 09/19/2019   LDLCALC 63 09/19/2019   ALT 17 09/19/2019   AST 24 09/19/2019   NA 143 09/19/2019   K 4.0 09/19/2019   CL 106 09/19/2019   CREATININE 1.80 (H) 09/19/2019   BUN 43 (H) 09/19/2019   CO2 30 09/19/2019   TSH 1.41 09/19/2019   PSA 0.29 04/22/2018   INR 1.06 12/11/2012   HGBA1C 5.6 09/19/2019    BNP (last 3 results) Recent Labs    12/22/18 1929  BNP 238.0*    Other Studies Reviewed Today:  Echo 02/10/16 Myovue 01/25/16  Carotid 04/25/17   Assessment/Plan:  1. HTN -  Improved on current meds his BP cuff inaccurate adjust time of day taking meds Low sodium diet  2. HLD - on statin therapy.   3. Carotid disease -  05/26/16 Duplex 40-59%  By new criteria 04/25/17 plaque no stenosis will order f/u duplex   4. COPD with stable dyspnea.   5. CKD - CR 1.8 on 09/19/19 f/u renal   6. Neuro:  myesthenia w/u pending vision better f/u [place] for 6th nerve palsy  7. 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 OSA: f/u [place] wearing CPAP  Neuropathy: on gabapentin some  drowsiness     Current medicines are reviewed with the patient today.  The patient does not have concerns regarding medicines other than what has been noted above.  The following changes have been made:  See above.  Carotid Duplex ordered    Disposition:      F.u with me in a year    Jenkins Rouge

## 2019-11-18 NOTE — Telephone Encounter (Signed)
Called but per wife, patient not at home. Will call him back to figure out what exactly does he need.

## 2019-11-19 NOTE — Telephone Encounter (Signed)
Lvm for patient to call me back

## 2019-11-20 ENCOUNTER — Ambulatory Visit: Payer: Medicare HMO | Admitting: Podiatry

## 2019-11-21 MED ORDER — ALLOPURINOL 100 MG PO TABS: 100.0000 mg | ORAL_TABLET | Freq: Two times a day (BID) | ORAL | 1 refills | Status: DC

## 2019-11-21 NOTE — Telephone Encounter (Signed)
Lvm for patient to call for new instructions

## 2019-11-21 NOTE — Telephone Encounter (Signed)
Please advise pt that I reviewed his dosing with Dr. Raeford Razor.  We are going to change allopurinol to 100mg  bid. I have sent a new rx to [place].  In regards to gabapentin, it is ok for him to increase frequency to 2-3 times daily.

## 2019-11-24 ENCOUNTER — Telehealth: Payer: Self-pay | Admitting: Family

## 2019-11-24 NOTE — Telephone Encounter (Signed)
-----   Message from Rosemarie Ax, MD sent at 11/20/2019  5:38 PM EST ----- Hello,   I think that's a plan.   Thanks,  Ysidro Evert  ----- Message ----- From: [name], NP Sent: 11/20/2019   9:48 AM EST To: Rosemarie Ax, MD  Hi,  So it looks like he  is already on losartan-HCTZ.  Maybe try 100 bid?  Thanks,  Kierra Jezewski  ----- Message ----- From: Rosemarie Ax, MD Sent: 11/19/2019   1:47 PM EST To: [name], NP  Hello,   It looks like his CrCl is around 34. From what I've read, he can tolerate the higher dosing since he was already initiated the 100 mg/day. We could consider 100 mg BID or 200 mg QOD and monitor his kidney function and if it maintains then we could consider increasing to 300 mg/day. You can consider switching from amlodipine to losartan as it will excrete some uric acid through its process. Let me know your thoughts.  Thanks,  Ysidro Evert   ----- Message ----- From: [name], NP Sent: 11/19/2019   1:27 PM EST To: Rosemarie Ax, MD  Hi Modena Slater you are well. Thanks for your help with Mr. Colliver.  I just wanted to touch base with you re: his renal function and dosing of Allopurinol.  His GFR is about 36.  He had reached out to me with question about dosage safety. He is already on colchicine once daily so I am not sure what else we could add.    Thanks,  [employer]

## 2019-11-24 NOTE — Telephone Encounter (Signed)
Patient advised of new directions . He verbalized understanding.

## 2019-11-24 NOTE — Telephone Encounter (Signed)
Called again but no answer,will try again later

## 2019-11-27 DIAGNOSIS — N2581 Secondary hyperparathyroidism of renal origin: Secondary | ICD-10-CM | POA: Diagnosis not present

## 2019-11-27 DIAGNOSIS — E785 Hyperlipidemia, unspecified: Secondary | ICD-10-CM | POA: Diagnosis not present

## 2019-11-27 DIAGNOSIS — I129 Hypertensive chronic kidney disease with stage 1 through stage 4 chronic kidney disease, or unspecified chronic kidney disease: Secondary | ICD-10-CM | POA: Diagnosis not present

## 2019-11-27 DIAGNOSIS — Z87442 Personal history of urinary calculi: Secondary | ICD-10-CM | POA: Diagnosis not present

## 2019-11-27 DIAGNOSIS — N1832 Chronic kidney disease, stage 3b: Secondary | ICD-10-CM | POA: Diagnosis not present

## 2019-11-27 DIAGNOSIS — D649 Anemia, unspecified: Secondary | ICD-10-CM | POA: Diagnosis not present

## 2019-11-27 DIAGNOSIS — M109 Gout, unspecified: Secondary | ICD-10-CM | POA: Diagnosis not present

## 2019-11-28 ENCOUNTER — Telehealth: Payer: Self-pay

## 2019-11-28 NOTE — Telephone Encounter (Signed)
Patient called in to see if NP Inda Castle could send in a prescription for  gabapentin (NEURONTIN) 300 MG capsule [id number]    Please send it to [place]  [place], [place] [address]  Phone:  [phone] Fax:  [phone]  DEA #:  --

## 2019-12-01 ENCOUNTER — Other Ambulatory Visit: Payer: Self-pay

## 2019-12-01 MED ORDER — GABAPENTIN 300 MG PO CAPS: ORAL_CAPSULE | ORAL | 1 refills | Status: DC

## 2019-12-01 NOTE — Telephone Encounter (Signed)
Rx sent with new directions as indicated on last message.

## 2019-12-02 ENCOUNTER — Other Ambulatory Visit: Payer: Self-pay

## 2019-12-02 ENCOUNTER — Encounter: Payer: Self-pay | Admitting: Cardiovascular Disease

## 2019-12-02 ENCOUNTER — Ambulatory Visit: Payer: Medicare HMO | Admitting: Cardiovascular Disease

## 2019-12-02 VITALS — BP 142/76 | HR 57 | Ht 67.0 in | Wt 165.0 lb

## 2019-12-02 DIAGNOSIS — E785 Hyperlipidemia, unspecified: Secondary | ICD-10-CM | POA: Diagnosis not present

## 2019-12-02 DIAGNOSIS — I10 Essential (primary) hypertension: Secondary | ICD-10-CM | POA: Diagnosis not present

## 2019-12-02 DIAGNOSIS — I779 Disorder of arteries and arterioles, unspecified: Secondary | ICD-10-CM | POA: Diagnosis not present

## 2019-12-02 NOTE — Patient Instructions (Addendum)
Medication Instructions:  *If you need a refill on your cardiac medications before your next appointment, please call your pharmacy*  Lab Work: If you have labs (blood work) drawn today and your tests are completely normal, you will receive your results only by: . MyChart Message (if you have MyChart) OR . A paper copy in the mail If you have any lab test that is abnormal or we need to change your treatment, we will call you to review the results.  Testing/Procedures: Your physician has requested that you have a carotid duplex. This test is an ultrasound of the carotid arteries in your neck. It looks at blood flow through these arteries that supply the brain with blood. Allow one hour for this exam. There are no restrictions or special instructions.  Follow-Up: At [facility], you and your health needs are our priority.  As part of our continuing mission to provide you with exceptional heart care, we have created designated Provider Care Teams.  These Care Teams include your primary Cardiologist (physician) and Advanced Practice Providers (APPs -  Physician Assistants and Nurse Practitioners) who all work together to provide you with the care you need, when you need it.  We recommend signing up for the patient portal called "MyChart".  Sign up information is provided on this After Visit Summary.  MyChart is used to connect with patients for Virtual Visits (Telemedicine).  Patients are able to view lab/test results, encounter notes, upcoming appointments, etc.  Non-urgent messages can be sent to your provider as well.   To learn more about what you can do with MyChart, go to [email or web address].    Your next appointment:   12 month(s)  The format for your next appointment:   In Person  Provider:   You may see Peter Nishan, MD or one of the following Advanced Practice Providers on your designated Care Team:    Lori Gerhardt, NP  Laura Ingold, NP  Jill McDaniel, NP    

## 2019-12-04 ENCOUNTER — Other Ambulatory Visit: Payer: Self-pay | Admitting: Family

## 2019-12-09 ENCOUNTER — Other Ambulatory Visit: Payer: Self-pay

## 2019-12-09 ENCOUNTER — Ambulatory Visit (HOSPITAL_COMMUNITY)
Admission: RE | Admit: 2019-12-09 | Discharge: 2019-12-09 | Disposition: A | Discharge status: Home / Self Care | Payer: Medicare HMO | Source: Ambulatory Visit | Attending: Cardiovascular Disease | Admitting: Cardiovascular Disease

## 2019-12-09 DIAGNOSIS — I779 Disorder of arteries and arterioles, unspecified: Secondary | ICD-10-CM | POA: Diagnosis not present

## 2019-12-11 ENCOUNTER — Encounter: Payer: Self-pay | Admitting: Family Medicine

## 2019-12-11 ENCOUNTER — Ambulatory Visit: Payer: Medicare HMO | Admitting: Family Medicine

## 2019-12-11 ENCOUNTER — Other Ambulatory Visit: Payer: Self-pay

## 2019-12-11 VITALS — BP 157/72 | HR 59 | Ht 67.0 in | Wt 162.0 lb

## 2019-12-11 DIAGNOSIS — M66869 Spontaneous rupture of other tendons, unspecified lower leg: Secondary | ICD-10-CM

## 2019-12-11 DIAGNOSIS — M6688 Spontaneous rupture of other tendons, other: Secondary | ICD-10-CM | POA: Diagnosis not present

## 2019-12-11 DIAGNOSIS — R252 Cramp and spasm: Secondary | ICD-10-CM | POA: Diagnosis not present

## 2019-12-11 NOTE — Assessment & Plan Note (Signed)
Intermittent in nature.  Do not seem as severe as they were.  Uric acid was elevated.  Unclear if this is related to his symptoms.  He seems to have been intermittently. -We have adjusted the allopurinol. -Referral to physical therapy. -If ongoing could consider imaging of the lumbar spine to evaluate for any spinal stenosis or nerve impingement.

## 2019-12-11 NOTE — Progress Notes (Addendum)
[name] Pratt - 79 y.o. male MRN [id number]  Date of birth: 04/22/1941  SUBJECTIVE:  Including CC & ROS.  Chief Complaint  Patient presents with  . Follow-up    follow up for left foot    [name] Pratt is a 79 y.o. male that is following up for his left foot pain and swelling and is leg cramps.  He has tried some compression socks which seemed to improve some of his pain.  His lab work was revealing for an elevated uric acid which we have asked that his allopurinol.  His cramps are intermittent in nature.  His foot pain is improved with the insoles as well as compression..  Review of the lab work from 3/4 shows an elevated uric acid and ferritin.   Review of Systems See HPI   HISTORY: Past Medical, Surgical, Social, and Family History Reviewed & Updated per EMR.   Pertinent Historical Findings include:  Past Medical History:  Diagnosis Date  . Arthritis   . Cancer ([name])    skin cancer  . Carotid stenosis    a. Carotid U/S [id number]: LICA < A999333, RICA 0000000;  b.  Carotid U/S 0000000:  RICA 123456; LICA [id number]; f/u 1 year  . Chest pain     Myoview in 2008 was normal.  Echo 7/09: EF 60%, normal wall motion, mild LVH, mild LAE.  [name] Complication of anesthesia    "stomach does not wake up"  . COPD (chronic obstructive pulmonary disease) ([name])   . GERD (gastroesophageal reflux disease)   . Gout   . History of chicken pox   . History of hemorrhoids   . History of kidney stones   . Hyperlipidemia    under control  . Hypertension    under control  . Hypothyroidism   . Ileus ([name])   . Inguinal hernia   . OSA (obstructive sleep apnea)   . Pancreatitis   . PONV (postoperative nausea and vomiting)   . Rosacea   . 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    Past Surgical History:  Procedure Laterality Date  . ABDOMINAL HERNIA REPAIR  07/15/12   Dr [name]  . BROW LIFT  05/07/01  . CYSTOSCOPY WITH URETEROSCOPY AND STENT PLACEMENT Right  01/28/2014   Procedure: CYSTOSCOPY WITH URETEROSCOPY, BASKET RETRIVAL AND  STENT PLACEMENT;  Surgeon: [name], MD;  Location: [facility];  Service: Urology;  Laterality: Right;  . epidural injections     multiple procedures  . [place]   multiple times  . EYE SURGERY Bilateral 03/25/10, 2012   cataract removal  . HEMORRHOID SURGERY  2014  . HIATAL HERNIA REPAIR  12/21/93  . HOLMIUM LASER APPLICATION Right [id number]   Procedure: HOLMIUM LASER APPLICATION;  Surgeon: [name], MD;  [facility];  Service: Urology;  Laterality: Right;  . INGUINAL HERNIA REPAIR Right 07/15/12   Dr [name], x2  . JOINT REPLACEMENT  07/25/04   right knee  . JOINT REPLACEMENT  07/13/10   left knee  . KNEE SURGERY  1995  . LAPAROSCOPIC CHOLECYSTECTOMY  09/20/06   with intraoperative cholangiogram and right inguinal herniorrhaphy with mesh   . LIGAMENT REPAIR Left 07/2013   shoulder  . LITHOTRIPSY     x2  . NASAL SEPTUM SURGERY  1975  . REFRACTIVE SURGERY Bilateral   . 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 SKIN CANCER DESTRUCTION  nose, ear  . TONSILLECTOMY  as child  . TOTAL HIP ARTHROPLASTY Left   . URETHRAL DILATION  1991   [name]    Family History  Problem Relation Age of Onset  . Cancer Mother   . Hypertension Other   . Cancer Other   . Osteoarthritis Other     Social History   Socioeconomic History  . Marital status: Married    Spouse name: Not on file  . Number of children: 1  . Years of education: Not on file  . Highest education level: Not on file  Occupational History  . 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 Smoking status: Former Smoker    Years: 11.00    Types: Cigarettes    Quit date: 09/11/1978    Years since quitting: 41.3  . Smokeless tobacco: Never Used  Substance and Sexual Activity  . Alcohol use: Yes    Alcohol/week: 0.0 standard drinks    Comment: rare  . Drug use: No  . Sexual activity:  Not on file  Other Topics Concern  . Not on file  Social History Narrative   Lives with his wife   He has one son- lives locally.   2 grandchildren   Has worked for Research officer, trade union   Enjoys wood working   Completed HS.  [employer]   Social Determinants of Radio broadcast assistant Strain:   . Difficulty of Paying Living Expenses:   Food Insecurity:   . Worried About Charity fundraiser in the Last Year:   . Arboriculturist in the Last Year:   Transportation Needs:   . Film/video editor (Medical):   [name] Lack of Transportation (Non-Medical):   Physical Activity:   . Days of Exercise per Week:   . Minutes of Exercise per Session:   Stress:   . Feeling of Stress :   Social Connections:   . Frequency of Communication with Friends and Family:   . Frequency of Social Gatherings with Friends and Family:   . Attends Religious Services:   . Active Member of Clubs or Organizations:   . Attends Archivist Meetings:   [name] Marital Status:   Intimate Partner Violence:   . Fear of Current or Ex-Partner:   . Emotionally Abused:   [name] Physically Abused:   . Sexually Abused:      PHYSICAL EXAM:  VS: BP (!) 157/72   Pulse (!) 59   Ht 5\' 7"  (1.702 m)   Wt 162 lb (73.5 kg)   BMI 25.37 kg/m  Physical Exam Gen: NAD, alert, cooperative with exam, well-appearing MSK:  Left foot: Swelling is still ongoing.   Ecchymosis occurring over the dorsum of the midfoot as well as the toes. Normal range of motion. Foot drop and weakness with dorsal flexion.  Neurovascular intact     ASSESSMENT & PLAN:   Tibialis anterior tendon tear, nontraumatic We have tried incidentals and scaphoid patch which helped a little.  He has tried compression which seems to help as well.  He recently dropped a box on his foot which caused some ecchymosis. -Counseled on compression. -Counseled on exercise therapy and supportive care -Referral to physical therapy. -Midfoot arch strap. - try AFO  due to foot drop with tear  -Could consider custom orthotics.  Leg cramps Intermittent in nature.  Do not seem as severe as they were.  Uric acid was elevated.  Unclear  if this is related to his symptoms.  He seems to have been intermittently. -We have adjusted the allopurinol. -Referral to physical therapy. -If ongoing could consider imaging of the lumbar spine to evaluate for any spinal stenosis or nerve impingement.

## 2019-12-11 NOTE — Patient Instructions (Signed)
Good to see you Please try the arch strap  Please try ice on the left foot. Please let me know if your left foot still hurts and we can do an xray  Physical therapy will give you a call   Please send me a message in MyChart with any questions or updates.  Please see me back in 6 weeks.   --Dr. Raeford Razor

## 2019-12-11 NOTE — Assessment & Plan Note (Addendum)
We have tried incidentals and scaphoid patch which helped a little.  He has tried compression which seems to help as well.  He recently dropped a box on his foot which caused some ecchymosis. -Counseled on compression. -Counseled on exercise therapy and supportive care -Referral to physical therapy. -Midfoot arch strap. - try AFO due to foot drop with tear  -Could consider custom orthotics.

## 2019-12-12 DIAGNOSIS — G4733 Obstructive sleep apnea (adult) (pediatric): Secondary | ICD-10-CM | POA: Diagnosis not present

## 2019-12-19 ENCOUNTER — Ambulatory Visit: Payer: Medicare HMO | Admitting: Rehabilitative and Restorative Service Providers"

## 2019-12-19 ENCOUNTER — Other Ambulatory Visit: Payer: Self-pay

## 2019-12-19 DIAGNOSIS — M6281 Muscle weakness (generalized): Secondary | ICD-10-CM

## 2019-12-19 DIAGNOSIS — R2689 Other abnormalities of gait and mobility: Secondary | ICD-10-CM | POA: Diagnosis not present

## 2019-12-19 NOTE — Therapy (Signed)
[facility] [address], [place], [address] Phone: [phone]   Fax:  [phone]  Physical Therapy Evaluation  Patient Details  Name: [name] Pratt MRN: [id number] Date of Birth: [birth year] Referring Provider (PT): Clearance Coots, MD   Encounter Date: 12/19/2019  PT End of Session - 12/19/19 1611    Visit Number  1    Number of Visits  12    Date for PT Re-Evaluation  01/30/20    PT Start Time  1400    PT Stop Time  1445    PT Time Calculation (min)  45 min    Activity Tolerance  Patient tolerated treatment well    Behavior During Therapy  [facility] for tasks assessed/performed       Past Medical History:  Diagnosis Date  . Arthritis   . Cancer ([place])    skin cancer  . Carotid stenosis    a. Carotid U/S [id number]: LICA < A999333, RICA 0000000;  b.  Carotid U/S 0000000:  RICA 123456; LICA [id number]; f/u 1 year  . Chest pain     Myoview in 2008 was normal.  Echo 7/09: EF 60%, normal wall motion, mild LVH, mild LAE.  [name] Complication of anesthesia    "stomach does not wake up"  . COPD (chronic obstructive pulmonary disease) ([place])   . GERD (gastroesophageal reflux disease)   . Gout   . History of chicken pox   . History of hemorrhoids   . History of kidney stones   . Hyperlipidemia    under control  . Hypertension    under control  . Hypothyroidism   . Ileus ([place])   . Inguinal hernia   . OSA (obstructive sleep apnea)   . Pancreatitis   . PONV (postoperative nausea and vomiting)   . Rosacea   . 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    Past Surgical History:  Procedure Laterality Date  . ABDOMINAL HERNIA REPAIR  07/15/12   Dr [name]  . BROW LIFT  05/07/01  . CYSTOSCOPY WITH URETEROSCOPY AND STENT PLACEMENT Right 01/28/2014   Procedure: CYSTOSCOPY WITH URETEROSCOPY, BASKET RETRIVAL AND  STENT PLACEMENT;  Surgeon: [name], MD;  [facility];  Service: Urology;   Laterality: Right;  . epidural injections     multiple procedures  . [place]   multiple times  . EYE SURGERY Bilateral 03/25/10, 2012   cataract removal  . HEMORRHOID SURGERY  2014  . HIATAL HERNIA REPAIR  12/21/93  . HOLMIUM LASER APPLICATION Right [id number]   Procedure: HOLMIUM LASER APPLICATION;  Surgeon: [name], MD;  [facility];  Service: Urology;  Laterality: Right;  . INGUINAL HERNIA REPAIR Right 07/15/12   Dr [name], x2  . JOINT REPLACEMENT  07/25/04   right knee  . JOINT REPLACEMENT  07/13/10   left knee  . KNEE SURGERY  1995  . LAPAROSCOPIC CHOLECYSTECTOMY  09/20/06   with intraoperative cholangiogram and right inguinal herniorrhaphy with mesh   . LIGAMENT REPAIR Left 07/2013   shoulder  . LITHOTRIPSY     x2  . NASAL SEPTUM SURGERY  1975  . REFRACTIVE SURGERY Bilateral   . 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 SKIN CANCER DESTRUCTION     nose, ear  . TONSILLECTOMY  as child  . TOTAL HIP ARTHROPLASTY Left   . URETHRAL DILATION  1991   Dr.  Wrenn    There were no vitals filed for this visit.   Subjective Assessment - 12/19/19 1405    Subjective  The patient reports he was getting charlie horses in his legs worse at night, and now feels achiness that is present in both legs from the knees down at night.  His legs feel weak and tired, and he is unable to squat to work on things around the house (he falls forward).  He gets pain in bilateral feet and got a tendon rupture in the L lower leg (tibialis anterior).  He reports he has h/o dizziness when he gets dehydrated.  He has had dizziness so bad he had to go to the hospital (found to have a UTI).    Pertinent History  bilateral knee replacements (over 10 years ago), bilateral hip replacements, HTN, hyperlipidemia, neuropathy, h/o double vision (reports he pulled an eye muscle in the past)    Patient Stated Goals  Get stronger in legs and arms for improved mobility.    Currently in Pain?   Yes    Pain Location  Toe (Comment which one)   5th toe   Pain Descriptors / Indicators  Sore    Pain Type  Acute pain    Pain Onset  More than a month ago    Pain Frequency  Intermittent    Aggravating Factors   toes rub    Pain Relieving Factors  cottom         [facility] PT Assessment - 12/19/19 1417      Assessment   Medical Diagnosis  leg cramps, tibialis anterior tendon tear    Referring Provider (PT)  Clearance Coots, MD    Onset Date/Surgical Date  12/11/19      Precautions   Precautions  Fall      Balance Screen   Has the patient fallen in the past 6 months  Yes    How many times?  2-outdoors, when bending forward    Has the patient had a decrease in activity level because of a fear of falling?   No    Is the patient reluctant to leave their home because of a fear of falling?   No      Home Environment   Living Environment  Private residence    Living Arrangements  Spouse/significant other    Type of [place] to enter    Entrance Stairs-Number of Steps  8    Entrance Stairs-Rails  Can reach both    [place]  Two level    Alternate Level Stairs-Number of Steps  [place]  None      Prior Function   Level of Independence  Independent   with falls   Leisure  mows yard, works in the yard      Observation/Other Assessments   Focus on Therapeutic Outcomes (FOTO)   39% limited      Observation/Other Assessments-Edema    Edema  --   swelling noted L ankle and L foot     Posture/Postural Control   Posture/Postural Control  Postural limitations    Posture Comments  patient has high arches in sitting with arch support donned; with gait, he is a functional pronator.  He has anterior leaning in standing.       ROM / Strength   AROM / PROM / Strength  AROM;Strength      AROM   Overall AROM  Deficits    Overall AROM Comments  Ankle AROM DF to neutral bilaterally.  Bilateral hip flexors appear tight in standing with forward  flexion at trunk.      Strength   Overall Strength  Deficits    Overall Strength Comments  Bilateral hip flexion 5/5, bilateral knee flexion/extension is 5/5, L ankle DF is 3/5, R ankle DF is 5/5      Transfers   Transfers  Sit to Stand;Stand to Sit    Sit to Stand  7: Independent    Stand to Sit  7: Independent      Ambulation/Gait   Ambulation/Gait  Yes    Ambulation/Gait Assistance  6: Modified independent (Device/Increase time)    Ambulation Distance (Feet)  150 Feet    Assistive device  None    Gait Pattern  --   L ankle foot slap, pronation, functional   Ambulation Surface  Level;Indoor    Gait velocity  2.83 ft/sec    Stairs  Yes    Stairs Assistance  6: Modified independent (Device/Increase time)    Stair Management Technique  One rail Right    Number of Stairs  4    Pre-Gait Activities  --    Gait Comments  Patient has foot slap L foot during gait      Standardized Balance Assessment   Standardized Balance Assessment  Berg Balance Test      Berg Balance Test   Sit to Stand  Able to stand without using hands and stabilize independently    Standing Unsupported  Able to stand safely 2 minutes    Sitting with Back Unsupported but Feet Supported on Floor or Stool  Able to sit safely and securely 2 minutes    Stand to Sit  Sits safely with minimal use of hands    Transfers  Able to transfer safely, minor use of hands    Standing Unsupported with Eyes Closed  Able to stand 10 seconds safely    Standing Unsupported with Feet Together  Able to place feet together independently and stand 1 minute safely    From Standing, Reach Forward with Outstretched Arm  Can reach confidently >25 cm (10")    From Standing Position, Pick up Object from Floor  Able to pick up shoe, needs supervision   keeps knees locked   From Standing Position, Turn to Look Behind Over each Shoulder  Looks behind from both sides and weight shifts well    Turn 360 Degrees  Able to turn 360 degrees safely in  4 seconds or less    Standing Unsupported, Alternately Place Feet on Step/Stool  Able to stand independently and safely and complete 8 steps in 20 seconds    Standing Unsupported, One Foot in Front  Able to plae foot ahead of the other independently and hold 30 seconds    Standing on One Leg  Tries to lift leg/unable to hold 3 seconds but remains standing independently    Total Score  51    Berg comment:  51/56                Objective measurements completed on examination: See above findings.      [facility] Adult PT Treatment/Exercise - 12/19/19 1417      Exercises   Exercises  Ankle      Ankle Exercises: Stretches   Gastroc Stretch  1 rep;20 seconds      Ankle Exercises: Standing   Heel Raises  10 reps  Toe Raise  10 reps      Ankle Exercises: Seated   Other Seated Ankle Exercises  sit<>stand x 10 reps             PT Education - 12/19/19 1444    Education Details  HEP    Person(s) Educated  Patient    Methods  Explanation;Demonstration;Handout    Comprehension  Verbalized understanding;Returned demonstration          PT Long Term Goals - 12/19/19 1426      PT LONG TERM GOAL #1   Title  The patient will be indep with HEP.    Time  6    Period  Weeks    Target Date  01/30/20      PT LONG TERM GOAL #2   Title  The patient will reduce limitation per FOTO from 39% to < or equal to 29%.    Time  6    Period  Weeks    Target Date  01/30/20      PT LONG TERM GOAL #3   Title  The patient will improve gait speed from 2.83 ft/sec to > or equal to 3.2 ft/sec.    Time  6    Period  Weeks    Target Date  01/30/20      PT LONG TERM GOAL #4   Title  The patient will maintain single leg stance x 10 seconds bilateraly.    Baseline  4 seconds    Time  6    Period  Weeks    Target Date  01/30/20      PT LONG TERM GOAL #5   Title  The patient will return demo floor <> stand with one UE support on surface to rise.    Time  6    Period  Weeks    Target  Date  01/30/20             Plan - 12/19/19 1617    Clinical Impression Statement  The patient is a 79 yo male referred to OP physical therapy with L tibialis anterior tendon tear and leg cramps.  He notes reduced stamina and leg weakness that limit IADL and recrational activities.  He presents with dec'd bilateral ankle DF AROM, weakness L anterior tibialis, foot slap with gait, slowed gait speed, dec'd gastroc flexibility.  Impairments lead to functional limitations of dec'd unlevel surface negotiation, dec'd IADL/recreational activity participation.  PT to address to optimize functional mobility.    Personal Factors and Comorbidities  Comorbidity 1;Comorbidity 2;Comorbidity 3+    Comorbidities  HTN, h/o bilateral TKAs, h/o bilateral THA,    Examination-Activity Limitations  Squat;Bend    Examination-Participation Restrictions  Community Activity    Stability/Clinical Decision Making  Stable/Uncomplicated    Clinical Decision Making  Low    Rehab Potential  Good    PT Frequency  2x / week    PT Duration  6 weeks    PT Treatment/Interventions  ADLs/Self Care Home Management;Gait training;Stair training;Functional mobility training;Therapeutic activities;Therapeutic exercise;Balance training;Neuromuscular re-education;Manual techniques;Patient/family education;Dry needling    PT Next Visit Plan  LE functional strengthening, test hip abductors MMT, gait training, hip flexor stretching (assess further), dynamic balance, progress HEP.    PT Home Exercise Plan  Access Code: [email or web address]    Consulted and Agree with Plan of Care  Patient       Patient will benefit from skilled therapeutic intervention in order to improve the following deficits and impairments:  Abnormal  gait, Decreased strength, Impaired flexibility, Decreased range of motion, Decreased activity tolerance  Visit Diagnosis: Other abnormalities of gait and mobility  Muscle weakness (generalized)     Problem List Patient  Active Problem List   Diagnosis Date Noted  . Leg cramps 11/13/2019  . Tibialis anterior tendon tear, nontraumatic 11/13/2019  . 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 CPAP (continuous positive airway pressure) dependence 03/25/2018  . 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 Acute idiopathic gout of hand 03/25/2018  . Erectile dysfunction 06/20/2017  . 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 Peripheral neuropathy 06/19/2016  . Hearing loss 02/02/2016  . 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 Allergic rhinitis 02/02/2016  . Atypical chest pain 11/24/2015  . Preventative health care 11/24/2015  . Onychomycosis 06/30/2015  . Degenerative disc disease, lumbar 06/30/2015  . Other allergic rhinitis 02/18/2015  . Deviated nasal septum 02/18/2015  . OSA on CPAP 02/18/2015  . RLS (restless legs syndrome) 02/18/2015  . GERD (gastroesophageal reflux disease) 09/18/2014  . Hyperglycemia 03/03/2014  . Sleep apnea with use of continuous positive airway pressure (CPAP) 07/15/2013  . Carotid stenosis 02/24/2013  . Nonspecific abnormal electrocardiogram (ECG) (EKG) 01/06/2013  . DJD (degenerative joint disease) of hip 12/24/2012  . Lower GI bleed 12/12/2012  . Dyspnea 06/12/2011  . Hypothyroidism 05/13/2009  . HYPERCHOLESTEROLEMIA 05/13/2009  . Hyperlipidemia 05/13/2009  . 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  . Essential hypertension 05/13/2009  . INGUINAL HERNIA 05/13/2009  . HIATAL HERNIA 05/13/2009  . NEPHROLITHIASIS 05/13/2009  . ROSACEA 05/13/2009    [place], PT 12/19/2019, 4:21 PM  [facility] [place], [place], [address] Phone: [phone]   Fax:  [phone]  Name: KAIZEN BRUTUS MRN: [id number] Date of Birth: [birth year]

## 2019-12-19 NOTE — Patient Instructions (Signed)
Access Code: [email or web address] URL: [email or web address] Date: 12/19/2019 Prepared by: [name]  Exercises Sit to Stand without Arm Support - 2 x daily - 7 x weekly - 10 reps - 1 sets Standing Heel Raise - 2 x daily - 7 x weekly - 10 reps - 1 sets Heel Toe Raises with Counter Support - 2 x daily - 7 x weekly - 10 reps - 1 sets Gastroc Stretch on Wall - 2 x daily - 7 x weekly - 1 sets - 3 reps - 30 seconds hold

## 2019-12-22 ENCOUNTER — Other Ambulatory Visit: Payer: Self-pay | Admitting: Family

## 2019-12-22 DIAGNOSIS — L720 Epidermal cyst: Secondary | ICD-10-CM | POA: Diagnosis not present

## 2019-12-22 DIAGNOSIS — X32XXXS Exposure to sunlight, sequela: Secondary | ICD-10-CM | POA: Diagnosis not present

## 2019-12-22 DIAGNOSIS — L814 Other melanin hyperpigmentation: Secondary | ICD-10-CM | POA: Diagnosis not present

## 2019-12-22 DIAGNOSIS — L570 Actinic keratosis: Secondary | ICD-10-CM | POA: Diagnosis not present

## 2019-12-22 DIAGNOSIS — D692 Other nonthrombocytopenic purpura: Secondary | ICD-10-CM | POA: Diagnosis not present

## 2019-12-22 DIAGNOSIS — L821 Other seborrheic keratosis: Secondary | ICD-10-CM | POA: Diagnosis not present

## 2019-12-22 DIAGNOSIS — E039 Hypothyroidism, unspecified: Secondary | ICD-10-CM

## 2019-12-24 ENCOUNTER — Other Ambulatory Visit: Payer: Self-pay

## 2019-12-24 ENCOUNTER — Ambulatory Visit (INDEPENDENT_AMBULATORY_CARE_PROVIDER_SITE_OTHER): Payer: Medicare HMO | Admitting: Rehabilitative and Restorative Service Providers"

## 2019-12-24 ENCOUNTER — Encounter: Payer: Self-pay | Admitting: Rehabilitative and Restorative Service Providers"

## 2019-12-24 DIAGNOSIS — R2689 Other abnormalities of gait and mobility: Secondary | ICD-10-CM

## 2019-12-24 DIAGNOSIS — M6281 Muscle weakness (generalized): Secondary | ICD-10-CM

## 2019-12-24 NOTE — Therapy (Signed)
[place], [place], [address] Phone: [phone]   Fax:  [phone]  Physical Therapy Treatment  Patient Details  Name: [name] Pratt MRN: [id number] Date of Birth: 1941/01/31 Referring Provider (PT): Clearance Coots, MD   Encounter Date: 12/24/2019  PT End of Session - 12/24/19 1144    Visit Number  2    Number of Visits  12    Date for PT Re-Evaluation  01/30/20    PT Start Time  [id number]    PT Stop Time  1140    PT Time Calculation (min)  48 min    Activity Tolerance  Patient tolerated treatment well    Behavior During Therapy  [facility] for tasks assessed/performed       Past Medical History:  Diagnosis Date  . Arthritis   . Cancer ([place])    skin cancer  . Carotid stenosis    a. Carotid U/S [id number]: LICA < A999333, RICA 0000000;  b.  Carotid U/S 0000000:  RICA 123456; LICA [id number]; f/u 1 year  . Chest pain     Myoview in 2008 was normal.  Echo 7/09: EF 60%, normal wall motion, mild LVH, mild LAE.  [name] Complication of anesthesia    "stomach does not wake up"  . COPD (chronic obstructive pulmonary disease) ([place])   . GERD (gastroesophageal reflux disease)   . Gout   . History of chicken pox   . History of hemorrhoids   . History of kidney stones   . Hyperlipidemia    under control  . Hypertension    under control  . Hypothyroidism   . Ileus ([place])   . Inguinal hernia   . OSA (obstructive sleep apnea)   . Pancreatitis   . PONV (postoperative nausea and vomiting)   . Rosacea   . 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    Past Surgical History:  Procedure Laterality Date  . ABDOMINAL HERNIA REPAIR  07/15/12   Dr [name]  . BROW LIFT  05/07/01  . CYSTOSCOPY WITH URETEROSCOPY AND STENT PLACEMENT Right 01/28/2014   Procedure: CYSTOSCOPY WITH URETEROSCOPY, BASKET RETRIVAL AND  STENT PLACEMENT;  Surgeon: [name], MD;  Location: [facility];  Service: Urology;   Laterality: Right;  . epidural injections     multiple procedures  . [place]   multiple times  . EYE SURGERY Bilateral 03/25/10, 2012   cataract removal  . HEMORRHOID SURGERY  2014  . HIATAL HERNIA REPAIR  12/21/93  . HOLMIUM LASER APPLICATION Right [id number]   Procedure: HOLMIUM LASER APPLICATION;  Surgeon: [name], MD;  Location: [facility];  Service: Urology;  Laterality: Right;  . INGUINAL HERNIA REPAIR Right 07/15/12   Dr [name], x2  . JOINT REPLACEMENT  07/25/04   right knee  . JOINT REPLACEMENT  07/13/10   left knee  . KNEE SURGERY  1995  . LAPAROSCOPIC CHOLECYSTECTOMY  09/20/06   with intraoperative cholangiogram and right inguinal herniorrhaphy with mesh   . LIGAMENT REPAIR Left 07/2013   shoulder  . LITHOTRIPSY     x2  . NASAL SEPTUM SURGERY  1975  . REFRACTIVE SURGERY Bilateral   . 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 SKIN CANCER DESTRUCTION     nose, ear  . TONSILLECTOMY  as child  . TOTAL HIP ARTHROPLASTY Left   . URETHRAL DILATION  1991   Dr.  Wrenn    There were no vitals filed for this visit.  Subjective Assessment - 12/24/19 1052    Subjective  The patient did well with HEP.  He already notes improved balance with exercises.    Pertinent History  bilateral knee replacements (over 10 years ago), bilateral hip replacements, HTN, hyperlipidemia, neuropathy, h/o double vision (reports he pulled an eye muscle in the past)    Patient Stated Goals  Get stronger in legs and arms for improved mobility.    Currently in Pain?  No/denies                       [facility] Adult PT Treatment/Exercise - 12/24/19 1058      Transfers   Transfers  Sit to Stand    Sit to Stand  7: Independent    Comments  10 reps of sit<>stand      Ambulation/Gait   Ambulation/Gait  Yes    Ambulation/Gait Assistance  6: Modified independent (Device/Increase time)    Ambulation Distance (Feet)  350 Feet    Assistive device  None    Gait Comments   cues for longer stride length      Neuro Re-ed    Neuro Re-ed Details   Wall bumps working on ant/posterior weight shifting, then with eyes closed and with compliant surfaces x 10 reps.  Performed static standing narrowing base of support on compliant surface with eyes closed.  Feet in stride performing ant/posterior rocking x 10 reps leading R and L LEs.       Exercises   Exercises  Knee/Hip;Ankle      Knee/Hip Exercises: Stretches   Active Hamstring Stretch  Right;Left;1 rep;30 seconds    Hip Flexor Stretch  Right;Left;30 seconds      Knee/Hip Exercises: Standing   Hip Flexion  Stengthening;Right;Left;10 reps    Hip Flexion Limitations  marching x 10 reps    Hip Abduction  10 reps    Abduction Limitations  side stepping R and L sides x 10 feet x 2 reps    Hip Extension  10 reps    Extension Limitations  backwards walking x 10 steps x 4 sets    Forward Step Up  Right;Left;5 reps    Forward Step Up Limitations  up/up, down/down       Knee/Hip Exercises: Sidelying   Hip ABduction  Strengthening;Right;Left;10 reps      Ankle Exercises: Stretches   Slant Board Stretch  1 rep;60 seconds      Ankle Exercises: Standing   SLS  standing on one leg x 10 second holds x 3 reps each side.      Ankle Exercises: Seated   Other Seated Ankle Exercises  ankle eversion x 15 reps R and L with red theraband and cues to reduce hip movement    Other Seated Ankle Exercises  seated L ankle DF with red theraband.             PT Education - 12/24/19 1139    Education Details  HEP    Person(s) Educated  Patient    Methods  Explanation;Demonstration;Handout    Comprehension  Verbalized understanding;Returned demonstration          PT Long Term Goals - 12/19/19 1426      PT LONG TERM GOAL #1   Title  The patient will be indep with HEP.    Time  6    Period  Weeks    Target Date  01/30/20      PT LONG TERM GOAL #2   Title  The patient will reduce limitation per FOTO from 39% to < or  equal to 29%.    Time  6    Period  Weeks    Target Date  01/30/20      PT LONG TERM GOAL #3   Title  The patient will improve gait speed from 2.83 ft/sec to > or equal to 3.2 ft/sec.    Time  6    Period  Weeks    Target Date  01/30/20      PT LONG TERM GOAL #4   Title  The patient will maintain single leg stance x 10 seconds bilateraly.    Baseline  4 seconds    Time  6    Period  Weeks    Target Date  01/30/20      PT LONG TERM GOAL #5   Title  The patient will return demo floor <> stand with one UE support on surface to rise.    Time  6    Period  Weeks    Target Date  01/30/20            Plan - 12/24/19 1145    Clinical Impression Statement  The patient is tolerating progression of ther ex for LEs and dynamic balance tasks today.  He continues with increased trunk flexion with gait activities and needs cues fo rupriht posture.  Plan to continue to progress to tolerance.    PT Treatment/Interventions  ADLs/Self Care Home Management;Gait training;Stair training;Functional mobility training;Therapeutic activities;Therapeutic exercise;Balance training;Neuromuscular re-education;Manual techniques;Patient/family education;Dry needling    PT Next Visit Plan  focus on low back extension and ROM into anterior pelvic tilt,LE functional strengthening, gait training,dynamic balance, progress HEP.    PT Home Exercise Plan  Access Code: [email or web address]    Consulted and Agree with Plan of Care  Patient       Patient will benefit from skilled therapeutic intervention in order to improve the following deficits and impairments:  Abnormal gait, Decreased strength, Impaired flexibility, Decreased range of motion, Decreased activity tolerance  Visit Diagnosis: Other abnormalities of gait and mobility  Muscle weakness (generalized)     Problem List Patient Active Problem List   Diagnosis Date Noted  . Leg cramps 11/13/2019  . Tibialis anterior tendon tear, nontraumatic 11/13/2019  .  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 CPAP (continuous positive airway pressure) dependence 03/25/2018  . 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 Acute idiopathic gout of hand 03/25/2018  . Erectile dysfunction 06/20/2017  . 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 Peripheral neuropathy 06/19/2016  . Hearing loss 02/02/2016  . 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 Allergic rhinitis 02/02/2016  . Atypical chest pain 11/24/2015  . Preventative health care 11/24/2015  . Onychomycosis 06/30/2015  . Degenerative disc disease, lumbar 06/30/2015  . Other allergic rhinitis 02/18/2015  . Deviated nasal septum 02/18/2015  . OSA on CPAP 02/18/2015  . RLS (restless legs syndrome) 02/18/2015  . GERD (gastroesophageal reflux disease) 09/18/2014  . Hyperglycemia 03/03/2014  . Sleep apnea with use of continuous positive airway pressure (CPAP) 07/15/2013  . Carotid stenosis 02/24/2013  . Nonspecific abnormal electrocardiogram (ECG) (EKG) 01/06/2013  . DJD (degenerative joint disease) of hip 12/24/2012  . Lower GI bleed 12/12/2012  . Dyspnea 06/12/2011  . Hypothyroidism 05/13/2009  . HYPERCHOLESTEROLEMIA 05/13/2009  . Hyperlipidemia 05/13/2009  . 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  .  Essential hypertension 05/13/2009  . INGUINAL HERNIA 05/13/2009  . HIATAL HERNIA 05/13/2009  . NEPHROLITHIASIS 05/13/2009  . ROSACEA 05/13/2009    [place], PT 12/24/2019, 11:53 AM  [facility] [place] [address], [place], [address] Phone: [phone]   Fax:  [phone]  Name: BRENNON STUPP MRN: [id number] Date of Birth: [birth year]

## 2019-12-24 NOTE — Patient Instructions (Signed)
Access Code: [email or web address] URL: [email or web address] Date: 12/24/2019 Prepared by: [name]  Exercises Single Leg Stance - 1 x daily - 7 x weekly - 1 sets - 3 reps - 10 seconds hold Sit to Stand without Arm Support - 1 x daily - 7 x weekly - 10 reps - 1 sets Standing Heel Raise - 1 x daily - 7 x weekly - 10 reps - 1 sets Heel Toe Raises with Counter Support - 1 x daily - 7 x weekly - 10 reps - 1 sets Gastroc Stretch on Wall - 1 x daily - 7 x weekly - 1 sets - 3 reps - 30 seconds hold Seated Ankle Eversion with Anchored Resistance - 1 x daily - 7 x weekly - 1 sets - 10 reps Sidelying Hip Abduction - 1 x daily - 7 x weekly - 1 sets - 10 reps Thomas Stretch on Table - 1 x daily - 7 x weekly - 1 sets - 2 reps - 30 seconds hold

## 2019-12-26 ENCOUNTER — Other Ambulatory Visit: Payer: Self-pay

## 2019-12-26 ENCOUNTER — Ambulatory Visit (INDEPENDENT_AMBULATORY_CARE_PROVIDER_SITE_OTHER): Payer: Medicare HMO | Admitting: Rehabilitative and Restorative Service Providers"

## 2019-12-26 ENCOUNTER — Telehealth: Payer: Self-pay | Admitting: Rehabilitative and Restorative Service Providers"

## 2019-12-26 DIAGNOSIS — R2689 Other abnormalities of gait and mobility: Secondary | ICD-10-CM

## 2019-12-26 DIAGNOSIS — M6281 Muscle weakness (generalized): Secondary | ICD-10-CM

## 2019-12-26 DIAGNOSIS — M6688 Spontaneous rupture of other tendons, other: Secondary | ICD-10-CM

## 2019-12-26 DIAGNOSIS — M66869 Spontaneous rupture of other tendons, unspecified lower leg: Secondary | ICD-10-CM

## 2019-12-26 NOTE — Telephone Encounter (Signed)
Dr. Raeford Razor, [name] Pratt is being treated by physical therapy for L anterior tibialis tendon tear.  He may benefit from use of a left AFO in order to improve safety with functional mobility-- he notes toe drag when working in his yard and walking longer distances.  If you agree, please place an order in Epic.  Thank you, [name], [place] Phone [phone] Fax [phone]

## 2019-12-26 NOTE — Therapy (Signed)
[place], [place], [address] Phone: [phone]   Fax:  [phone]  Physical Therapy Treatment  Patient Details  Name: [name] Pratt MRN: [id number] Date of Birth: [birth year] Referring Provider (PT): Clearance Coots, MD   Encounter Date: 12/26/2019  PT End of Session - 12/26/19 1349    Visit Number  3    Number of Visits  12    Date for PT Re-Evaluation  01/30/20    PT Start Time  [id number]    PT Stop Time  1437    PT Time Calculation (min)  48 min    Activity Tolerance  Patient tolerated treatment well    Behavior During Therapy  [facility] for tasks assessed/performed       Past Medical History:  Diagnosis Date  . Arthritis   . Cancer ([place])    skin cancer  . Carotid stenosis    a. Carotid U/S [id number]: LICA < A999333, RICA 0000000;  b.  Carotid U/S 0000000:  RICA 123456; LICA [id number]; f/u 1 year  . Chest pain     Myoview in 2008 was normal.  Echo 7/09: EF 60%, normal wall motion, mild LVH, mild LAE.  [name] Complication of anesthesia    "stomach does not wake up"  . COPD (chronic obstructive pulmonary disease) ([place])   . GERD (gastroesophageal reflux disease)   . Gout   . History of chicken pox   . History of hemorrhoids   . History of kidney stones   . Hyperlipidemia    under control  . Hypertension    under control  . Hypothyroidism   . Ileus ([place])   . Inguinal hernia   . OSA (obstructive sleep apnea)   . Pancreatitis   . PONV (postoperative nausea and vomiting)   . Rosacea   . 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    Past Surgical History:  Procedure Laterality Date  . ABDOMINAL HERNIA REPAIR  07/15/12   Dr [name]  . BROW LIFT  05/07/01  . CYSTOSCOPY WITH URETEROSCOPY AND STENT PLACEMENT Right 01/28/2014   Procedure: CYSTOSCOPY WITH URETEROSCOPY, BASKET RETRIVAL AND  STENT PLACEMENT;  Surgeon: [name], MD;  Location: [facility];  Service: Urology;   Laterality: Right;  . epidural injections     multiple procedures  . [place]   multiple times  . EYE SURGERY Bilateral 03/25/10, 2012   cataract removal  . HEMORRHOID SURGERY  2014  . HIATAL HERNIA REPAIR  12/21/93  . HOLMIUM LASER APPLICATION Right [id number]   Procedure: HOLMIUM LASER APPLICATION;  Surgeon: [name], MD;  Location: [facility];  Service: Urology;  Laterality: Right;  . INGUINAL HERNIA REPAIR Right 07/15/12   Dr [name], x2  . JOINT REPLACEMENT  07/25/04   right knee  . JOINT REPLACEMENT  07/13/10   left knee  . KNEE SURGERY  1995  . LAPAROSCOPIC CHOLECYSTECTOMY  09/20/06   with intraoperative cholangiogram and right inguinal herniorrhaphy with mesh   . LIGAMENT REPAIR Left 07/2013   shoulder  . LITHOTRIPSY     x2  . NASAL SEPTUM SURGERY  1975  . REFRACTIVE SURGERY Bilateral   . 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 SKIN CANCER DESTRUCTION     nose, ear  . TONSILLECTOMY  as child  . TOTAL HIP ARTHROPLASTY Left   . URETHRAL DILATION  1991   Dr.  Wrenn    There were no vitals filed for this visit.  Subjective Assessment - 12/26/19 1347    Subjective  The patient reports he is tired today-- he had 3 episodes of cramping last night that woke him.    Pertinent History  bilateral knee replacements (over 10 years ago), bilateral hip replacements, HTN, hyperlipidemia, neuropathy, h/o double vision (reports he pulled an eye muscle in the past)    Patient Stated Goals  Get stronger in legs and arms for improved mobility.    Currently in Pain?  No/denies                       [facility] Adult PT Treatment/Exercise - 12/26/19 1355      Ambulation/Gait   Ambulation/Gait  Yes    Ambulation/Gait Assistance  6: Modified independent (Device/Increase time)    Ambulation Distance (Feet)  200 Feet    Assistive device  None    Gait Comments  Gait with upright posture emphasizing longer stride length; discussed options for foot slap due to  dec'd eccentric anterior tib control due to tendon tear      Self-Care   Self-Care  Other Self-Care Comments    Other Self-Care Comments   showed patient what an AFO looked like and options and how they help with foot clearance during gait      Neuro Re-ed    Neuro Re-ed Details   Side stepping without UE support, posterior weight shifting with cues and CGA (maintains weight anteriorly); feet in stride rocking ant/posteriorly with tactile cues for hip strategy, tandem gait, backwards walking, toe walking, attempted heel walking with CGA.      Exercises   Exercises  Knee/Hip;Ankle;Lumbar      Lumbar Exercises: Stretches   Other Lumbar Stretch Exercise  hooklying lumbar extension over 1/2 foam roll     Other Lumbar Stretch Exercise  standing forward flexion to countertop<>into lumbar extension with UE support on countertop      Lumbar Exercises: Seated   Other Seated Lumbar Exercises  Physiodisc sitting with anterior/posterior pelvic tilt and tactile cues for lumbar motion      Lumbar Exercises: Prone   Other Prone Lumbar Exercises  Prone on elbows stretch x 60 seconds.      Knee/Hip Exercises: Stretches   Active Hamstring Stretch  Right;Left;2 reps;30 seconds    Other Knee/Hip Stretches  slant board x 30 seconds for gastroc stretch      Knee/Hip Exercises: Aerobic   Tread Mill  1.5 mph with bilat UE support x 3 minutes (forward flexed posture)      Knee/Hip Exercises: Standing   Forward Lunges Limitations  isometric lunge holds x 20 seconds R and L side      Knee/Hip Exercises: Supine   Bridges  Strengthening;Both;10 reps      Knee/Hip Exercises: Prone   Hamstring Curl  5 reps    Hamstring Curl Limitations  attempted multiple trials with hamstring cramps    Other Prone Exercises  prone quad set x 10 reps R and L sides                  PT Long Term Goals - 12/19/19 1426      PT LONG TERM GOAL #1   Title  The patient will be indep with HEP.    Time  6    Period   Weeks    Target Date  01/30/20      PT LONG  TERM GOAL #2   Title  The patient will reduce limitation per FOTO from 39% to < or equal to 29%.    Time  6    Period  Weeks    Target Date  01/30/20      PT LONG TERM GOAL #3   Title  The patient will improve gait speed from 2.83 ft/sec to > or equal to 3.2 ft/sec.    Time  6    Period  Weeks    Target Date  01/30/20      PT LONG TERM GOAL #4   Title  The patient will maintain single leg stance x 10 seconds bilateraly.    Baseline  4 seconds    Time  6    Period  Weeks    Target Date  01/30/20      PT LONG TERM GOAL #5   Title  The patient will return demo floor <> stand with one UE support on surface to rise.    Time  6    Period  Weeks    Target Date  01/30/20            Plan - 12/26/19 1612    Clinical Impression Statement  The patient notes toe drag during gait when in his yard and in his home that can trip him when walking.  We discussed options for AFO use today.  He would like to pursue an AFO for unlevel surfaces or when walking for longer periods (during travel).  The patient is tolerating dynamic balance activities, LE strengthening, postural stretching well.  PT to continue to work towards [place].    PT Treatment/Interventions  ADLs/Self Care Home Management;Gait training;Stair training;Functional mobility training;Therapeutic activities;Therapeutic exercise;Balance training;Neuromuscular re-education;Manual techniques;Patient/family education;Dry needling    PT Next Visit Plan  focus on low back extension and ROM into anterior pelvic tilt,LE functional strengthening, gait training,dynamic balance, progress HEP.    PT Home Exercise Plan  Access Code: [email or web address]    Consulted and Agree with Plan of Care  Patient       Patient will benefit from skilled therapeutic intervention in order to improve the following deficits and impairments:  Abnormal gait, Decreased strength, Impaired flexibility, Decreased range of motion,  Decreased activity tolerance  Visit Diagnosis: Other abnormalities of gait and mobility  Muscle weakness (generalized)     Problem List Patient Active Problem List   Diagnosis Date Noted  . Leg cramps 11/13/2019  . Tibialis anterior tendon tear, nontraumatic 11/13/2019  . 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 CPAP (continuous positive airway pressure) dependence 03/25/2018  . 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 Acute idiopathic gout of hand 03/25/2018  . Erectile dysfunction 06/20/2017  . 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 Peripheral neuropathy 06/19/2016  . Hearing loss 02/02/2016  . 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 Allergic rhinitis 02/02/2016  . Atypical chest pain 11/24/2015  . Preventative health care 11/24/2015  . Onychomycosis 06/30/2015  . Degenerative disc disease, lumbar 06/30/2015  . Other allergic rhinitis 02/18/2015  . Deviated nasal septum 02/18/2015  . OSA on CPAP 02/18/2015  . RLS (restless legs syndrome) 02/18/2015  . GERD (gastroesophageal reflux disease) 09/18/2014  . Hyperglycemia 03/03/2014  . Sleep apnea with use of continuous positive airway pressure (CPAP) 07/15/2013  . Carotid stenosis 02/24/2013  . Nonspecific abnormal electrocardiogram (ECG) (EKG) 01/06/2013  . DJD (degenerative joint disease) of hip 12/24/2012  . Lower GI bleed  12/12/2012  . Dyspnea 06/12/2011  . Hypothyroidism 05/13/2009  . HYPERCHOLESTEROLEMIA 05/13/2009  . Hyperlipidemia 05/13/2009  . 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  . Essential hypertension 05/13/2009  . INGUINAL HERNIA 05/13/2009  . HIATAL HERNIA 05/13/2009  . NEPHROLITHIASIS 05/13/2009  . ROSACEA 05/13/2009    Keisuke Hollabaugh, PT 12/26/2019, 4:15 PM  [facility] [place], [place], [address] Phone: [phone]   Fax:  [phone]  Name: [name] Pratt MRN: [id number] Date of  Birth: 22-Aug-1941

## 2019-12-30 NOTE — Telephone Encounter (Signed)
Provided AFO for left ankle/foot.   Rosemarie Ax, MD [facility] 12/30/2019, 2:00 PM

## 2020-01-02 ENCOUNTER — Encounter: Payer: Self-pay | Admitting: Rehabilitative and Restorative Service Providers"

## 2020-01-02 ENCOUNTER — Ambulatory Visit: Payer: Medicare HMO | Admitting: Rehabilitative and Restorative Service Providers"

## 2020-01-02 ENCOUNTER — Other Ambulatory Visit: Payer: Self-pay

## 2020-01-02 DIAGNOSIS — M66869 Spontaneous rupture of other tendons, unspecified lower leg: Secondary | ICD-10-CM

## 2020-01-02 DIAGNOSIS — M6688 Spontaneous rupture of other tendons, other: Secondary | ICD-10-CM

## 2020-01-02 DIAGNOSIS — R2689 Other abnormalities of gait and mobility: Secondary | ICD-10-CM

## 2020-01-02 DIAGNOSIS — M6281 Muscle weakness (generalized): Secondary | ICD-10-CM

## 2020-01-02 NOTE — Therapy (Signed)
[facility] [address] , [place], [address] Phone: [phone]   Fax:  [phone]  Physical Therapy Treatment  Patient Details  Name: [name] Pratt MRN: [id number] Date of Birth: 05/10/1941 Referring Provider (PT): Jeremy Schmitz, MD   Encounter Date: 01/02/2020  PT End of Session - 01/02/20 1116    Visit Number  4    Number of Visits  12    Date for PT Re-Evaluation  01/30/20    PT Start Time  1102    PT Stop Time  1145    PT Time Calculation (min)  43 min    Activity Tolerance  Patient tolerated treatment well    Behavior During Therapy  WFL for tasks assessed/performed       Past Medical History:  Diagnosis Date  . Arthritis   . Cancer (HCC)    skin cancer  . Carotid stenosis    a. Carotid U/S 5/13: LICA < 50%, RICA 50-69%;  b.  Carotid U/S 5/14:  RICA 40-59%; LICA 0-39%; f/u 1 year  . Chest pain     Myoview in 2008 was normal.  Echo 7/09: EF 60%, normal wall motion, mild LVH, mild LAE.  . Complication of anesthesia    "stomach does not wake up"  . COPD (chronic obstructive pulmonary disease) (HCC)   . GERD (gastroesophageal reflux disease)   . Gout   . History of chicken pox   . History of hemorrhoids   . History of kidney stones   . Hyperlipidemia    under control  . Hypertension    under control  . Hypothyroidism   . Ileus (HCC)   . Inguinal hernia   . OSA (obstructive sleep apnea)   . Pancreatitis   . PONV (postoperative nausea and vomiting)   . Rosacea   . 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 Gary Rankin    Past Surgical History:  Procedure Laterality Date  . ABDOMINAL HERNIA REPAIR  07/15/12   Dr Reyes  . BROW LIFT  05/07/01  . CYSTOSCOPY WITH URETEROSCOPY AND STENT PLACEMENT Right 01/28/2014   Procedure: CYSTOSCOPY WITH URETEROSCOPY, BASKET RETRIVAL AND  STENT PLACEMENT;  Surgeon: David S Grapey, MD;  Location: [facility];  Service: Urology;   Laterality: Right;  . epidural injections     multiple procedures  . ESOPHAGEAL DILATION  1993 and 1994   multiple times  . EYE SURGERY Bilateral 03/25/10, 2012   cataract removal  . HEMORRHOID SURGERY  2014  . HIATAL HERNIA REPAIR  12/21/93  . HOLMIUM LASER APPLICATION Right 01/28/2014   Procedure: HOLMIUM LASER APPLICATION;  Surgeon: David S Grapey, MD;  Location: [facility];  Service: Urology;  Laterality: Right;  . INGUINAL HERNIA REPAIR Right 07/15/12   Dr Reyes, x2  . JOINT REPLACEMENT  07/25/04   right knee  . JOINT REPLACEMENT  07/13/10   left knee  . KNEE SURGERY  1995  . LAPAROSCOPIC CHOLECYSTECTOMY  09/20/06   with intraoperative cholangiogram and right inguinal herniorrhaphy with mesh   . LIGAMENT REPAIR Left 07/2013   shoulder  . LITHOTRIPSY     x2  . NASAL SEPTUM SURGERY  1975  . REFRACTIVE SURGERY Bilateral   . 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 SKIN CANCER DESTRUCTION     nose, ear  . TONSILLECTOMY  as child  . TOTAL HIP ARTHROPLASTY Left   . URETHRAL DILATION  1991   Dr.   Wrenn    There were no vitals filed for this visit.  Subjective Assessment - 01/02/20 1109    Subjective  The patient reports he is swelling in his legs when he sits down.  He notes he wears the arch support bands daily.  Overall, he feels like he is doing better.    Pertinent History  bilateral knee replacements (over 10 years ago), bilateral hip replacements, HTN, hyperlipidemia, neuropathy, h/o double vision (reports he pulled an eye muscle in the past)    Patient Stated Goals  Get stronger in legs and arms for improved mobility.    Currently in Pain?  No/denies                       [facility] Adult PT Treatment/Exercise - 01/02/20 1116      Ambulation/Gait   Ambulation/Gait  Yes    Ambulation/Gait Assistance  6: Modified independent (Device/Increase time)    Ambulation Distance (Feet)  350 Feet    Assistive device  None    Gait Comments  gait emphasizing arm swing and  posture; backwards walking  x 20 feet x 4 reps and marching with CGA       Therapeutic Activites    Therapeutic Activities  Other Therapeutic Activities    Other Therapeutic Activities  floor to stand transfers      Neuro Re-ed    Neuro Re-ed Details   Compliant surface standing with wide to narrow base of support adding eyes closed with min A.  Then performed wall bumps on foam.    Partial tandem with anterior foot on compliant surface, posterior foot on solid surface performing alternating UE reaching.        Exercises   Exercises  Knee/Hip;Ankle;Lumbar      Lumbar Exercises: Seated   Other Seated Lumbar Exercises  Physiodisc sitting with anterior/posterior pelvic tilt and tactile cues for lumbar motion      Lumbar Exercises: Quadruped   [place]  10 reps      Knee/Hip Exercises: Stretches   Active Hamstring Stretch  Right;Left;30 seconds;2 reps    Passive Hamstring Stretch  Right;Left;1 rep;30 seconds    Piriformis Stretch  Right;Left;1 rep;30 seconds      Knee/Hip Exercises: Standing   Hip Abduction  10 reps    Abduction Limitations  side stepping squats R and L x 12 reps x 2 sets    Hip Extension  10 reps    Extension Limitations  backwards walking x 10 steps x 4 sets    Forward Step Up  Right;Left;5 reps    Forward Step Up Limitations  up/up, down down with wide to narrow steps    Wall Squat  1 set;10 reps    Gait Training  emphasizing bilateral heel strike with hip discomfort bilaterally                  PT Long Term Goals - 01/02/20 1248      PT LONG TERM GOAL #1   Title  The patient will be indep with HEP.    Time  6    Period  Weeks      PT LONG TERM GOAL #2   Title  The patient will reduce limitation per FOTO from 39% to < or equal to 29%.    Time  6    Period  Weeks      PT LONG TERM GOAL #3   Title  The patient will improve gait  speed from 2.83 ft/sec to > or equal to 3.2 ft/sec.    Time  6    Period  Weeks      PT LONG TERM GOAL #4    Title  The patient will maintain single leg stance x 10 seconds bilateraly.    Baseline  4 seconds    Time  6    Period  Weeks      PT LONG TERM GOAL #5   Title  The patient will return demo floor <> stand with one UE support on surface to rise.    Time  6    Period  Weeks    Status  Achieved            Plan - 01/02/20 1248    Clinical Impression Statement  The patient met LTG for floor transfer.  PT continuing to work on general LE strengthening progressing to tolerance.  Plan to continue working to [place] and send fax for AFO to begin process for L AFO.    PT Treatment/Interventions  ADLs/Self Care Home Management;Gait training;Stair training;Functional mobility training;Therapeutic activities;Therapeutic exercise;Balance training;Neuromuscular re-education;Manual techniques;Patient/family education;Dry needling    PT Next Visit Plan  focus on low back extension and ROM into anterior pelvic tilt,LE functional strengthening, gait training,dynamic balance, progress HEP.    PT Home Exercise Plan  Access Code: [email or web address]    Consulted and Agree with Plan of Care  Patient       Patient will benefit from skilled therapeutic intervention in order to improve the following deficits and impairments:  Abnormal gait, Decreased strength, Impaired flexibility, Decreased range of motion, Decreased activity tolerance  Visit Diagnosis: Other abnormalities of gait and mobility  Tibialis anterior tendon tear, nontraumatic  Muscle weakness (generalized)     Problem List Patient Active Problem List   Diagnosis Date Noted  . Leg cramps 11/13/2019  . Tibialis anterior tendon tear, nontraumatic 11/13/2019  . 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 CPAP (continuous positive airway pressure) dependence 03/25/2018  . 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 Acute idiopathic gout of hand 03/25/2018  . Erectile dysfunction 06/20/2017  . 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 Peripheral neuropathy 06/19/2016  . Hearing loss 02/02/2016  . 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 Allergic rhinitis 02/02/2016  . Atypical chest pain 11/24/2015  . Preventative health care 11/24/2015  . Onychomycosis 06/30/2015  . Degenerative disc disease, lumbar 06/30/2015  . Other allergic rhinitis 02/18/2015  . Deviated nasal septum 02/18/2015  . OSA on CPAP 02/18/2015  . RLS (restless legs syndrome) 02/18/2015  . GERD (gastroesophageal reflux disease) 09/18/2014  . Hyperglycemia 03/03/2014  . Sleep apnea with use of continuous positive airway pressure (CPAP) 07/15/2013  . Carotid stenosis 02/24/2013  . Nonspecific abnormal electrocardiogram (ECG) (EKG) 01/06/2013  . DJD (degenerative joint disease) of hip 12/24/2012  . Lower GI bleed 12/12/2012  . Dyspnea 06/12/2011  . Hypothyroidism 05/13/2009  . HYPERCHOLESTEROLEMIA 05/13/2009  . Hyperlipidemia 05/13/2009  . 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  . Essential hypertension 05/13/2009  . INGUINAL HERNIA 05/13/2009  . HIATAL HERNIA 05/13/2009  . NEPHROLITHIASIS 05/13/2009  . ROSACEA 05/13/2009    Lisanne Ponce, PT 01/02/2020, 12:50 PM  [facility] [place], [place], [address] Phone: [phone]   Fax:  [phone]  Name: [name] Pratt MRN: [id number] Date of Birth: 1941-01-20

## 2020-01-08 ENCOUNTER — Encounter: Payer: Self-pay | Admitting: Rehabilitative and Restorative Service Providers"

## 2020-01-08 ENCOUNTER — Ambulatory Visit: Payer: Medicare HMO | Admitting: Rehabilitative and Restorative Service Providers"

## 2020-01-08 ENCOUNTER — Other Ambulatory Visit: Payer: Self-pay

## 2020-01-08 DIAGNOSIS — M6281 Muscle weakness (generalized): Secondary | ICD-10-CM | POA: Diagnosis not present

## 2020-01-08 DIAGNOSIS — M6688 Spontaneous rupture of other tendons, other: Secondary | ICD-10-CM | POA: Diagnosis not present

## 2020-01-08 DIAGNOSIS — R2689 Other abnormalities of gait and mobility: Secondary | ICD-10-CM | POA: Diagnosis not present

## 2020-01-08 DIAGNOSIS — M66869 Spontaneous rupture of other tendons, unspecified lower leg: Secondary | ICD-10-CM

## 2020-01-08 NOTE — Patient Instructions (Signed)
Access Code: [email or web address] URL: [email or web address] Date: 01/08/2020 Prepared by: [name]  Exercises Standing Gaze Stabilization with Head Rotation - 2 x daily - 7 x weekly - 1 sets - 1 reps Single Leg Stance - 1 x daily - 7 x weekly - 1 sets - 3 reps - 10 seconds hold Standing Heel Raise - 1 x daily - 7 x weekly - 10 reps - 1 sets Heel Toe Raises with Counter Support - 1 x daily - 7 x weekly - 10 reps - 1 sets Gastroc Stretch on Wall - 1 x daily - 7 x weekly - 1 sets - 3 reps - 30 seconds hold Seated Ankle Eversion with Anchored Resistance - 1 x daily - 7 x weekly - 1 sets - 10 reps Seated Hamstring Stretch - 2 x daily - 7 x weekly - 1 sets - 3 reps - 20-30 seconds hold Sidelying Hip Abduction - 1 x daily - 7 x weekly - 1 sets - 10 reps Thomas Stretch on Table - 1 x daily - 7 x weekly - 1 sets - 2 reps - 30 seconds hold

## 2020-01-08 NOTE — Therapy (Signed)
[place], [place], [address] Phone: [phone]   Fax:  [phone]  Physical Therapy Treatment  Patient Details  Name: [name] Pratt MRN: [id number] Date of Birth: 1941-04-02 Referring Provider (PT): Clearance Coots, MD   Encounter Date: 01/08/2020  PT End of Session - 01/08/20 1148    Visit Number  5    Number of Visits  12    Date for PT Re-Evaluation  01/30/20    PT Start Time  4132    PT Stop Time  1230    PT Time Calculation (min)  45 min    Activity Tolerance  Patient tolerated treatment well    Behavior During Therapy  [facility] for tasks assessed/performed       Past Medical History:  Diagnosis Date  . Arthritis   . Cancer ([place])    skin cancer  . Carotid stenosis    a. Carotid U/S 4/40: LICA < 10%, RICA 27-25%;  b.  Carotid U/S 3/66:  RICA 44-03%; LICA 4-74%; f/u 1 year  . Chest pain     Myoview in 2008 was normal.  Echo 7/09: EF 60%, normal wall motion, mild LVH, mild LAE.  [name] Complication of anesthesia    "stomach does not wake up"  . COPD (chronic obstructive pulmonary disease) ([place])   . GERD (gastroesophageal reflux disease)   . Gout   . History of chicken pox   . History of hemorrhoids   . History of kidney stones   . Hyperlipidemia    under control  . Hypertension    under control  . Hypothyroidism   . Ileus ([place])   . Inguinal hernia   . OSA (obstructive sleep apnea)   . Pancreatitis   . PONV (postoperative nausea and vomiting)   . Rosacea   . 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    Past Surgical History:  Procedure Laterality Date  . ABDOMINAL HERNIA REPAIR  07/15/12   Dr [name]  . BROW LIFT  05/07/01  . CYSTOSCOPY WITH URETEROSCOPY AND STENT PLACEMENT Right 01/28/2014   Procedure: CYSTOSCOPY WITH URETEROSCOPY, BASKET RETRIVAL AND  STENT PLACEMENT;  Surgeon: [name], MD;  Location: [facility];  Service: Urology;   Laterality: Right;  . epidural injections     multiple procedures  . [place]   multiple times  . EYE SURGERY Bilateral 03/25/10, 2012   cataract removal  . HEMORRHOID SURGERY  2014  . HIATAL HERNIA REPAIR  12/21/93  . HOLMIUM LASER APPLICATION Right 2/59/5638   Procedure: HOLMIUM LASER APPLICATION;  Surgeon: [name], MD;  Location: [facility];  Service: Urology;  Laterality: Right;  . INGUINAL HERNIA REPAIR Right 07/15/12   Dr [name], x2  . JOINT REPLACEMENT  07/25/04   right knee  . JOINT REPLACEMENT  07/13/10   left knee  . KNEE SURGERY  1995  . LAPAROSCOPIC CHOLECYSTECTOMY  09/20/06   with intraoperative cholangiogram and right inguinal herniorrhaphy with mesh   . LIGAMENT REPAIR Left 07/2013   shoulder  . LITHOTRIPSY     x2  . NASAL SEPTUM SURGERY  1975  . REFRACTIVE SURGERY Bilateral   . 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 SKIN CANCER DESTRUCTION     nose, ear  . TONSILLECTOMY  as child  . TOTAL HIP ARTHROPLASTY Left   . URETHRAL DILATION  1991   Dr.  Wrenn    There were no vitals filed for this visit.  Subjective Assessment - 01/08/20 1146    Subjective  The patient reports his energy level is low.  It takes him longer to accomplish tasks.  The patient is doing better, but still notices occasional episodes of imbalance.    Pertinent History  bilateral knee replacements (over 10 years ago), bilateral hip replacements, HTN, hyperlipidemia, neuropathy, h/o double vision (reports he pulled an eye muscle in the past)    Patient Stated Goals  Get stronger in legs and arms for improved mobility.    Currently in Pain?  No/denies                       [facility] Adult PT Treatment/Exercise - 01/08/20 1203      Ambulation/Gait   Ambulation/Gait  Yes    Ambulation/Gait Assistance  6: Modified independent (Device/Increase time)    Gait Comments  dynamic gait activities performing marching, heel/toe walking, backweards walking; outdoor  walking through grass and stepping down/up from curbs      Neuro Re-ed    Neuro Re-ed Details   Compliant surface training with feet wide to narrow with CGA, feet narrow with eyes closec x 20 second intervals x 3 reps, standing on one leg x 10 seconds R and L sides, tandem stance, and tandem walking.  Sidestepping and braiding with CGA for safety x 10 feet x 4 reps.        Exercises   Exercises  Lumbar      Lumbar Exercises: Stretches   Active Hamstring Stretch  Right;Left;30 seconds;2 reps    Lower Trunk Rotation  2 reps;30 seconds      Lumbar Exercises: Standing   Functional Squats  10 reps    Other Standing Lumbar Exercises  alternating posterior step downs x 8 reps, lateral step down x 10 reps alternating (on foam with CGA for safety)      Lumbar Exercises: Seated   Other Seated Lumbar Exercises  Physiodisc sitting with marching x 10 reps      Lumbar Exercises: Supine   Bridge  Non-compliant;10 reps;5 seconds      Lumbar Exercises: Sidelying   Hip Abduction  Right;Left;10 reps    Hip Abduction Limitations  cues on technique             PT Education - 01/08/20 1239    Education Details  HEP progression    Person(s) Educated  Patient    Methods  Explanation;Demonstration;Handout    Comprehension  Returned demonstration;Verbalized understanding          PT Long Term Goals - 01/08/20 1240      PT LONG TERM GOAL #1   Title  The patient will be indep with HEP.    Time  6    Period  Weeks    Target Date  01/30/20      PT LONG TERM GOAL #2   Title  The patient will reduce limitation per FOTO from 39% to < or equal to 29%.    Time  6    Period  Weeks      PT LONG TERM GOAL #3   Title  The patient will improve gait speed from 2.83 ft/sec to > or equal to 3.2 ft/sec.    Baseline  3.1 ft/sec on 4/29    Time  6    Period  Weeks    Status  On-going  PT LONG TERM GOAL #4   Title  The patient will maintain single leg stance x 10 seconds bilateraly.     Baseline  Patient able to perform 10 seconds on 3rd trial.    Time  6    Period  Weeks    Status  Achieved      PT LONG TERM GOAL #5   Title  The patient will return demo floor <> stand with one UE support on surface to rise.    Time  6    Period  Weeks    Status  Achieved            Plan - 01/08/20 1240    Clinical Impression Statement  The patient met LTG for single leg stance.  PT continuing to progress focusing on strength in hips and LEs.  Plan to pursue L AFO for long distance walking and unlevel surface negotiation.    PT Treatment/Interventions  ADLs/Self Care Home Management;Gait training;Stair training;Functional mobility training;Therapeutic activities;Therapeutic exercise;Balance training;Neuromuscular re-education;Manual techniques;Patient/family education;Dry needling    PT Next Visit Plan  focus on low back extension and ROM into anterior pelvic tilt,LE functional strengthening, gait training,dynamic balance, progress HEP.    PT Home Exercise Plan  Access Code: [email or web address]    Consulted and Agree with Plan of Care  Patient       Patient will benefit from skilled therapeutic intervention in order to improve the following deficits and impairments:  Abnormal gait, Decreased strength, Impaired flexibility, Decreased range of motion, Decreased activity tolerance  Visit Diagnosis: Other abnormalities of gait and mobility  Tibialis anterior tendon tear, nontraumatic  Muscle weakness (generalized)     Problem List Patient Active Problem List   Diagnosis Date Noted  . Leg cramps 11/13/2019  . Tibialis anterior tendon tear, nontraumatic 11/13/2019  . 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 CPAP (continuous positive airway pressure) dependence 03/25/2018  . 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 Acute idiopathic gout of hand 03/25/2018  . Erectile dysfunction 06/20/2017  . 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  Peripheral neuropathy 06/19/2016  . Hearing loss 02/02/2016  . 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 Allergic rhinitis 02/02/2016  . Atypical chest pain 11/24/2015  . Preventative health care 11/24/2015  . Onychomycosis 06/30/2015  . Degenerative disc disease, lumbar 06/30/2015  . Other allergic rhinitis 02/18/2015  . Deviated nasal septum 02/18/2015  . OSA on CPAP 02/18/2015  . RLS (restless legs syndrome) 02/18/2015  . GERD (gastroesophageal reflux disease) 09/18/2014  . Hyperglycemia 03/03/2014  . Sleep apnea with use of continuous positive airway pressure (CPAP) 07/15/2013  . Carotid stenosis 02/24/2013  . Nonspecific abnormal electrocardiogram (ECG) (EKG) 01/06/2013  . DJD (degenerative joint disease) of hip 12/24/2012  . Lower GI bleed 12/12/2012  . Dyspnea 06/12/2011  . Hypothyroidism 05/13/2009  . HYPERCHOLESTEROLEMIA 05/13/2009  . Hyperlipidemia 05/13/2009  . 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  . Essential hypertension 05/13/2009  . INGUINAL HERNIA 05/13/2009  . HIATAL HERNIA 05/13/2009  . NEPHROLITHIASIS 05/13/2009  . ROSACEA 05/13/2009    Daysi Boggan , PT 01/08/2020, 12:41 PM  [facility] [place], [place], [address] Phone: [phone]   Fax:  [phone]  Name: [name] Pratt MRN: [id number] Date of Birth: 09-Sep-1941

## 2020-01-11 DIAGNOSIS — G4733 Obstructive sleep apnea (adult) (pediatric): Secondary | ICD-10-CM | POA: Diagnosis not present

## 2020-01-12 ENCOUNTER — Other Ambulatory Visit: Payer: Self-pay

## 2020-01-12 ENCOUNTER — Ambulatory Visit: Payer: Medicare HMO | Admitting: Rehabilitative and Restorative Service Providers"

## 2020-01-12 DIAGNOSIS — M66869 Spontaneous rupture of other tendons, unspecified lower leg: Secondary | ICD-10-CM

## 2020-01-12 DIAGNOSIS — M6688 Spontaneous rupture of other tendons, other: Secondary | ICD-10-CM

## 2020-01-12 DIAGNOSIS — R2689 Other abnormalities of gait and mobility: Secondary | ICD-10-CM | POA: Diagnosis not present

## 2020-01-12 DIAGNOSIS — M6281 Muscle weakness (generalized): Secondary | ICD-10-CM | POA: Diagnosis not present

## 2020-01-12 NOTE — Therapy (Signed)
[place], [place], [address] Phone: [phone]   Fax:  [phone]  Physical Therapy Treatment  Patient Details  Name: [name] Pratt MRN: [id number] Date of Birth: 1941-05-11 Referring Provider (PT): Clearance Coots, MD   Encounter Date: 01/12/2020  PT End of Session - 01/12/20 1159    Visit Number  6    Number of Visits  12    Date for PT Re-Evaluation  01/30/20    PT Start Time  0932    PT Stop Time  1015    PT Time Calculation (min)  43 min    Activity Tolerance  Patient tolerated treatment well    Behavior During Therapy  [facility] for tasks assessed/performed       Past Medical History:  Diagnosis Date  . Arthritis   . Cancer ([place])    skin cancer  . Carotid stenosis    a. Carotid U/S [id number]: LICA < [id number], RICA 0000000;  b.  Carotid U/S 0000000:  RICA 123456; LICA [id number]; f/u 1 year  . Chest pain     Myoview in 2008 was normal.  Echo 7/09: EF 60%, normal wall motion, mild LVH, mild LAE.  [name] Complication of anesthesia    "stomach does not wake up"  . COPD (chronic obstructive pulmonary disease) ([place])   . GERD (gastroesophageal reflux disease)   . Gout   . History of chicken pox   . History of hemorrhoids   . History of kidney stones   . Hyperlipidemia    under control  . Hypertension    under control  . Hypothyroidism   . Ileus ([place])   . Inguinal hernia   . OSA (obstructive sleep apnea)   . Pancreatitis   . PONV (postoperative nausea and vomiting)   . Rosacea   . 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    Past Surgical History:  Procedure Laterality Date  . ABDOMINAL HERNIA REPAIR  07/15/12   Dr [name]  . BROW LIFT  05/07/01  . CYSTOSCOPY WITH URETEROSCOPY AND STENT PLACEMENT Right 01/28/2014   Procedure: CYSTOSCOPY WITH URETEROSCOPY, BASKET RETRIVAL AND  STENT PLACEMENT;  Surgeon: [name], MD;  Location: [facility];  Service: Urology;   Laterality: Right;  . epidural injections     multiple procedures  . [place]   multiple times  . EYE SURGERY Bilateral 03/25/10, 2012   cataract removal  . HEMORRHOID SURGERY  2014  . HIATAL HERNIA REPAIR  12/21/93  . HOLMIUM LASER APPLICATION Right [id number]   Procedure: HOLMIUM LASER APPLICATION;  Surgeon: [name], MD;  Location: [facility];  Service: Urology;  Laterality: Right;  . INGUINAL HERNIA REPAIR Right 07/15/12   Dr [name], x2  . JOINT REPLACEMENT  07/25/04   right knee  . JOINT REPLACEMENT  07/13/10   left knee  . KNEE SURGERY  1995  . LAPAROSCOPIC CHOLECYSTECTOMY  09/20/06   with intraoperative cholangiogram and right inguinal herniorrhaphy with mesh   . LIGAMENT REPAIR Left 07/2013   shoulder  . LITHOTRIPSY     x2  . NASAL SEPTUM SURGERY  1975  . REFRACTIVE SURGERY Bilateral   . 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 SKIN CANCER DESTRUCTION     nose, ear  . TONSILLECTOMY  as child  . TOTAL HIP ARTHROPLASTY Left   . URETHRAL DILATION  1991   Dr.  Wrenn    There were no vitals filed for this visit.  Subjective Assessment - 01/12/20 0936    Subjective  The patient reports tired due to not sleeping well.  Exercises are going well.    Pertinent History  bilateral knee replacements (over 10 years ago), bilateral hip replacements, HTN, hyperlipidemia, neuropathy, h/o double vision (reports he pulled an eye muscle in the past)    Patient Stated Goals  Get stronger in legs and arms for improved mobility.    Currently in Pain?  No/denies                       [facility] Adult PT Treatment/Exercise - 01/12/20 [id number]      Neuro Re-ed    Neuro Re-ed Details   compliant surface standing working on single leg control tapping various surfaces anterior/lateral/posterior; stepping over a cane and alteranting cone tapping to targets x 5 reps R and L with CGA, multidirectional walking with resistance band for dynamic stability.  Gaze adaptation  from HEP reviewed.      Exercises   Exercises  Knee/Hip      Lumbar Exercises: Standing   Functional Squats  10 reps    Other Standing Lumbar Exercises  split squats x 5 reps R and L sides dec'ing UE support      Knee/Hip Exercises: Stretches   Sports administrator  Right;Left;1 rep;60 seconds    Quad Stretch Limitations  seated and then PROM with overpressure in prone    Hip Flexor Stretch  Right;Left;1 rep;30 seconds    Hip Flexor Stretch Limitations  seated      Knee/Hip Exercises: Aerobic   Nustep  level 5 x 4 minutes      Knee/Hip Exercises: Standing   Heel Raises  1 set;10 reps    Hip Abduction  10 reps    Abduction Limitations  side stepping R and L x 10 reps    Hip Extension  10 reps    Extension Limitations  backwards walking x 10 steps x 4 sets    Step Down  Right;Left;5 reps    Step Down Limitations  alternating from 8" surface    Functional Squat  10 reps    Other Standing Knee Exercises  sit<>stand with L foot posterior x 10 reps      Knee/Hip Exercises: Supine   Other Supine Knee/Hip Exercises  bridge on ball  x 10 reps    Other Supine Knee/Hip Exercises  supine knee flexion moving ball into hips/away from hips                  PT Long Term Goals - 01/08/20 1240      PT LONG TERM GOAL #1   Title  The patient will be indep with HEP.    Time  6    Period  Weeks    Target Date  01/30/20      PT LONG TERM GOAL #2   Title  The patient will reduce limitation per FOTO from 39% to < or equal to 29%.    Time  6    Period  Weeks      PT LONG TERM GOAL #3   Title  The patient will improve gait speed from 2.83 ft/sec to > or equal to 3.2 ft/sec.    Baseline  3.1 ft/sec on 4/29    Time  6    Period  Weeks    Status  On-going  PT LONG TERM GOAL #4   Title  The patient will maintain single leg stance x 10 seconds bilateraly.    Baseline  Patient able to perform 10 seconds on 3rd trial.    Time  6    Period  Weeks    Status  Achieved      PT LONG  TERM GOAL #5   Title  The patient will return demo floor <> stand with one UE support on surface to rise.    Time  6    Period  Weeks    Status  Achieved            Plan - 01/12/20 1200    Clinical Impression Statement  The patient is continuing to progress with balance and strength.  He is scheduled to undergo AFO consult tomorrow.  PT will determine if    PT Treatment/Interventions  ADLs/Self Care Home Management;Gait training;Stair training;Functional mobility training;Therapeutic activities;Therapeutic exercise;Balance training;Neuromuscular re-education;Manual techniques;Patient/family education;Dry needling    PT Next Visit Plan  focus on low back extension and ROM into anterior pelvic tilt,LE functional strengthening, gait training,dynamic balance, progress HEP.    PT Home Exercise Plan  Access Code: [email or web address]    Consulted and Agree with Plan of Care  Patient       Patient will benefit from skilled therapeutic intervention in order to improve the following deficits and impairments:  Abnormal gait, Decreased strength, Impaired flexibility, Decreased range of motion, Decreased activity tolerance  Visit Diagnosis: Other abnormalities of gait and mobility  Tibialis anterior tendon tear, nontraumatic  Muscle weakness (generalized)     Problem List Patient Active Problem List   Diagnosis Date Noted  . Leg cramps 11/13/2019  . Tibialis anterior tendon tear, nontraumatic 11/13/2019  . 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 CPAP (continuous positive airway pressure) dependence 03/25/2018  . 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 Acute idiopathic gout of hand 03/25/2018  . Erectile dysfunction 06/20/2017  . 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 Peripheral neuropathy 06/19/2016  . Hearing loss 02/02/2016  . 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 Allergic rhinitis 02/02/2016  . Atypical chest pain 11/24/2015  .  Preventative health care 11/24/2015  . Onychomycosis 06/30/2015  . Degenerative disc disease, lumbar 06/30/2015  . Other allergic rhinitis 02/18/2015  . Deviated nasal septum 02/18/2015  . OSA on CPAP 02/18/2015  . RLS (restless legs syndrome) 02/18/2015  . GERD (gastroesophageal reflux disease) 09/18/2014  . Hyperglycemia 03/03/2014  . Sleep apnea with use of continuous positive airway pressure (CPAP) 07/15/2013  . Carotid stenosis 02/24/2013  . Nonspecific abnormal electrocardiogram (ECG) (EKG) 01/06/2013  . DJD (degenerative joint disease) of hip 12/24/2012  . Lower GI bleed 12/12/2012  . Dyspnea 06/12/2011  . Hypothyroidism 05/13/2009  . HYPERCHOLESTEROLEMIA 05/13/2009  . Hyperlipidemia 05/13/2009  . 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  . Essential hypertension 05/13/2009  . INGUINAL HERNIA 05/13/2009  . HIATAL HERNIA 05/13/2009  . NEPHROLITHIASIS 05/13/2009  . ROSACEA 05/13/2009    Koty Anctil, PT 01/12/2020, 12:45 PM  [facility] [place], [place], [address] Phone: [phone]   Fax:  [phone]  Name: [name] Pratt MRN: [id number] Date of Birth: 04-Oct-1940

## 2020-01-15 ENCOUNTER — Ambulatory Visit (INDEPENDENT_AMBULATORY_CARE_PROVIDER_SITE_OTHER): Payer: Medicare HMO | Admitting: Rehabilitative and Restorative Service Providers"

## 2020-01-15 ENCOUNTER — Other Ambulatory Visit: Payer: Self-pay

## 2020-01-15 DIAGNOSIS — R2689 Other abnormalities of gait and mobility: Secondary | ICD-10-CM | POA: Diagnosis not present

## 2020-01-15 DIAGNOSIS — M6281 Muscle weakness (generalized): Secondary | ICD-10-CM

## 2020-01-15 DIAGNOSIS — M6688 Spontaneous rupture of other tendons, other: Secondary | ICD-10-CM | POA: Diagnosis not present

## 2020-01-15 DIAGNOSIS — M66869 Spontaneous rupture of other tendons, unspecified lower leg: Secondary | ICD-10-CM

## 2020-01-15 NOTE — Therapy (Signed)
[place], [place], [address] Phone: [phone]   Fax:  [phone]  Physical Therapy Treatment  Patient Details  Name: [name] Pratt MRN: [id number] Date of Birth: 11/11/1940 Referring Provider (PT): Clearance Coots, MD   Encounter Date: 01/15/2020  PT End of Session - 01/15/20 1153    Visit Number  7    Number of Visits  12    Date for PT Re-Evaluation  01/30/20    PT Start Time  0962    PT Stop Time  1230    PT Time Calculation (min)  42 min    Activity Tolerance  Patient tolerated treatment well    Behavior During Therapy  [facility] for tasks assessed/performed       Past Medical History:  Diagnosis Date  . Arthritis   . Cancer ([place])    skin cancer  . Carotid stenosis    a. Carotid U/S 8/36: LICA < 62%, RICA 94-76%;  b.  Carotid U/S 5/46:  RICA 50-35%; LICA 4-65%; f/u 1 year  . Chest pain     Myoview in 2008 was normal.  Echo 7/09: EF 60%, normal wall motion, mild LVH, mild LAE.  [name] Complication of anesthesia    "stomach does not wake up"  . COPD (chronic obstructive pulmonary disease) ([place])   . GERD (gastroesophageal reflux disease)   . Gout   . History of chicken pox   . History of hemorrhoids   . History of kidney stones   . Hyperlipidemia    under control  . Hypertension    under control  . Hypothyroidism   . Ileus ([place])   . Inguinal hernia   . OSA (obstructive sleep apnea)   . Pancreatitis   . PONV (postoperative nausea and vomiting)   . Rosacea   . 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    Past Surgical History:  Procedure Laterality Date  . ABDOMINAL HERNIA REPAIR  07/15/12   Dr [name]  . BROW LIFT  05/07/01  . CYSTOSCOPY WITH URETEROSCOPY AND STENT PLACEMENT Right 01/28/2014   Procedure: CYSTOSCOPY WITH URETEROSCOPY, BASKET RETRIVAL AND  STENT PLACEMENT;  Surgeon: [name], MD;  [facility];  Service: Urology;   Laterality: Right;  . epidural injections     multiple procedures  . [place]   multiple times  . EYE SURGERY Bilateral 03/25/10, 2012   cataract removal  . HEMORRHOID SURGERY  2014  . HIATAL HERNIA REPAIR  12/21/93  . HOLMIUM LASER APPLICATION Right 6/81/2751   Procedure: HOLMIUM LASER APPLICATION;  Surgeon: [name], MD;  [facility];  Service: Urology;  Laterality: Right;  . INGUINAL HERNIA REPAIR Right 07/15/12   Dr [name], x2  . JOINT REPLACEMENT  07/25/04   right knee  . JOINT REPLACEMENT  07/13/10   left knee  . KNEE SURGERY  1995  . LAPAROSCOPIC CHOLECYSTECTOMY  09/20/06   with intraoperative cholangiogram and right inguinal herniorrhaphy with mesh   . LIGAMENT REPAIR Left 07/2013   shoulder  . LITHOTRIPSY     x2  . NASAL SEPTUM SURGERY  1975  . REFRACTIVE SURGERY Bilateral   . 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 SKIN CANCER DESTRUCTION     nose, ear  . TONSILLECTOMY  as child  . TOTAL HIP ARTHROPLASTY Left   . URETHRAL DILATION  1991   Dr.  Wrenn    There were no vitals filed for this visit.  Subjective Assessment - 01/15/20 1148    Subjective  The patient saw [facility] in [place] and was fit for an AFO to be delivered next week (on Friday).  He has done more day to day activities in his yard and done less home exercise.  He gets some soreness in his back with weed eating.    Pertinent History  bilateral knee replacements (over 10 years ago), bilateral hip replacements, HTN, hyperlipidemia, neuropathy, h/o double vision (reports he pulled an eye muscle in the past)    Patient Stated Goals  Get stronger in legs and arms for improved mobility.    Currently in Pain?  No/denies         [facility] PT Assessment - 01/15/20 1212      Observation/Other Assessments   Focus on Therapeutic Outcomes (FOTO)   29% limitation      Berg Balance Test   Sit to Stand  Able to stand without using hands and stabilize independently    Standing  Unsupported  Able to stand safely 2 minutes    Sitting with Back Unsupported but Feet Supported on Floor or Stool  Able to sit safely and securely 2 minutes    Stand to Sit  Sits safely with minimal use of hands    Transfers  Able to transfer safely, minor use of hands    Standing Unsupported with Eyes Closed  Able to stand 10 seconds safely    Standing Unsupported with Feet Together  Able to place feet together independently and stand 1 minute safely    From Standing, Reach Forward with Outstretched Arm  Can reach confidently >25 cm (10")    From Standing Position, Pick up Object from Floor  Able to pick up shoe safely and easily    From Standing Position, Turn to Look Behind Over each Shoulder  Looks behind from both sides and weight shifts well    Turn 360 Degrees  Able to turn 360 degrees safely in 4 seconds or less    Standing Unsupported, Alternately Place Feet on Step/Stool  Able to stand independently and safely and complete 8 steps in 20 seconds    Standing Unsupported, One Foot in Front  Able to place foot tandem independently and hold 30 seconds    Standing on One Leg  Able to lift leg independently and hold 5-10 seconds    Total Score  55    Berg comment:  55/56                   [facility] Adult PT Treatment/Exercise - 01/15/20 1210      Ambulation/Gait   Ambulation/Gait  Yes    Ambulation/Gait Assistance  6: Modified independent (Device/Increase time)    Gait Comments  Dynamic gait with direction changes walking forward x 20 feet, walking backwards x 20 feet, squat walking forward and backwards, marching in place with arms overhead for core engagement and marching forwards/backwards.      Neuro Re-ed    Neuro Re-ed Details   Compliant surface standing with head motion, step downs on compliant surface, figure 8 turns, alternating LE foot taps.        Exercises   Exercises  Knee/Hip      Lumbar Exercises: Standing   Heel Raises  10 reps    Heel Raises Limitations   unilateral heel raises    Functional Squats  10 reps  Forward Lunge  10 reps    Forward Lunge Limitations  split lunge with isometric holds and 10 reps of trunk rotation    Other Standing Lumbar Exercises  Sidestepping 10 reps R and L x 2 sets emphasizing core engagement                  PT Long Term Goals - 01/15/20 1212      PT LONG TERM GOAL #1   Title  The patient will be indep with HEP.    Time  6    Period  Weeks    Status  On-going      PT LONG TERM GOAL #2   Title  The patient will reduce limitation per FOTO from 39% to < or equal to 29%.    Baseline  Improved to 71% (29% limitation)    Time  6    Period  Weeks    Status  Achieved      PT LONG TERM GOAL #3   Title  The patient will improve gait speed from 2.83 ft/sec to > or equal to 3.2 ft/sec.    Baseline  3.1 ft/sec on 4/29    Time  6    Period  Weeks    Status  Partially Met      PT LONG TERM GOAL #4   Title  The patient will maintain single leg stance x 10 seconds bilateraly.    Baseline  Patient able to perform 10 seconds on 3rd trial.    Time  6    Period  Weeks    Status  Achieved      PT LONG TERM GOAL #5   Title  The patient will return demo floor <> stand with one UE support on surface to rise.    Time  6    Period  Weeks    Status  Achieved            Plan - 01/15/20 1240    Clinical Impression Statement  The patient is continuing to progress partially meeting LTGs.  He is working on [employer] and reports feeling "safer" during daily activities.  He is awaiting L AFO (carbon composite lightweight PLS AFO) to be delivered on Friday 01/23/20.  PT to place patient on hold x 1 week until after he receives AFO and then f/u to work on community gait, further dynamic balance as indicated.    PT Treatment/Interventions  ADLs/Self Care Home Management;Gait training;Stair training;Functional mobility training;Therapeutic activities;Therapeutic exercise;Balance training;Neuromuscular  re-education;Manual techniques;Patient/family education;Dry needling    PT Next Visit Plan  focus on low back extension and ROM into anterior pelvic tilt,LE functional strengthening, gait training,dynamic balance, progress HEP.    PT Home Exercise Plan  Access Code: [email or web address]    Consulted and Agree with Plan of Care  Patient       Patient will benefit from skilled therapeutic intervention in order to improve the following deficits and impairments:  Abnormal gait, Decreased strength, Impaired flexibility, Decreased range of motion, Decreased activity tolerance  Visit Diagnosis: Other abnormalities of gait and mobility  Tibialis anterior tendon tear, nontraumatic  Muscle weakness (generalized)     Problem List Patient Active Problem List   Diagnosis Date Noted  . Leg cramps 11/13/2019  . Tibialis anterior tendon tear, nontraumatic 11/13/2019  . 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 CPAP (continuous positive airway pressure) dependence 03/25/2018  . 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 Acute idiopathic gout  of hand 03/25/2018  . Erectile dysfunction 06/20/2017  . 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 Peripheral neuropathy 06/19/2016  . Hearing loss 02/02/2016  . 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 Allergic rhinitis 02/02/2016  . Atypical chest pain 11/24/2015  . Preventative health care 11/24/2015  . Onychomycosis 06/30/2015  . Degenerative disc disease, lumbar 06/30/2015  . Other allergic rhinitis 02/18/2015  . Deviated nasal septum 02/18/2015  . OSA on CPAP 02/18/2015  . RLS (restless legs syndrome) 02/18/2015  . GERD (gastroesophageal reflux disease) 09/18/2014  . Hyperglycemia 03/03/2014  . Sleep apnea with use of continuous positive airway pressure (CPAP) 07/15/2013  . Carotid stenosis 02/24/2013  . Nonspecific abnormal electrocardiogram (ECG) (EKG) 01/06/2013  . DJD (degenerative joint disease) of hip  12/24/2012  . Lower GI bleed 12/12/2012  . Dyspnea 06/12/2011  . Hypothyroidism 05/13/2009  . HYPERCHOLESTEROLEMIA 05/13/2009  . Hyperlipidemia 05/13/2009  . 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  . Essential hypertension 05/13/2009  . INGUINAL HERNIA 05/13/2009  . HIATAL HERNIA 05/13/2009  . NEPHROLITHIASIS 05/13/2009  . ROSACEA 05/13/2009    Jettie Lazare, PT 01/15/2020, 12:42 PM  [facility] [place], [place], [address] Phone: [phone]   Fax:  [phone]  Name: [name] Pratt MRN: [id number] Date of Birth: 1940-10-15

## 2020-01-21 ENCOUNTER — Encounter: Payer: Medicare HMO | Admitting: Rehabilitative and Restorative Service Providers"

## 2020-01-22 ENCOUNTER — Ambulatory Visit: Payer: Medicare HMO | Admitting: Family Medicine

## 2020-01-22 ENCOUNTER — Other Ambulatory Visit: Payer: Self-pay

## 2020-01-22 DIAGNOSIS — M66869 Spontaneous rupture of other tendons, unspecified lower leg: Secondary | ICD-10-CM

## 2020-01-22 DIAGNOSIS — M6688 Spontaneous rupture of other tendons, other: Secondary | ICD-10-CM

## 2020-01-22 DIAGNOSIS — R252 Cramp and spasm: Secondary | ICD-10-CM

## 2020-01-22 DIAGNOSIS — M1A0720 Idiopathic chronic gout, left ankle and foot, without tophus (tophi): Secondary | ICD-10-CM | POA: Diagnosis not present

## 2020-01-22 NOTE — Patient Instructions (Signed)
Good to see you Please try the ankle orthotic.  You can continue taking the gabapentin if it helps   You can get your uric acid re-checked in 1-2 months.  Please send me a message in MyChart with any questions or updates.  Please see me back in 2-3 months or as needed if better.   --Dr. Raeford Razor

## 2020-01-22 NOTE — Progress Notes (Signed)
[name] PITTSENBARGER - 79 y.o. male MRN [id number]  Date of birth: 04/05/1941  SUBJECTIVE:  Including CC & ROS.  No chief complaint on file.   [name] Pratt is a 79 y.o. male that is following up for his cramps and tibialis anterior tendon tear.  He has been doing well with physical therapy.  He feels stronger and more balance.  He still gets cramps in gastrocnemius bilaterally.  It is intermittent in nature.  He still takes 1 gabapentin at night.  To gabapentin makes him drowsy.   Review of Systems See HPI   HISTORY: Past Medical, Surgical, Social, and Family History Reviewed & Updated per EMR.   Pertinent Historical Findings include:  Past Medical History:  Diagnosis Date  . Arthritis   . Cancer ([place])    skin cancer  . Carotid stenosis    a. Carotid U/S [id number]: LICA < [id number], RICA 0000000;  b.  Carotid U/S 0000000:  RICA 123456; LICA [id number]; f/u 1 year  . Chest pain     Myoview in 2008 was normal.  Echo 7/09: EF 60%, normal wall motion, mild LVH, mild LAE.  [name] Complication of anesthesia    "stomach does not wake up"  . COPD (chronic obstructive pulmonary disease) ([place])   . GERD (gastroesophageal reflux disease)   . Gout   . History of chicken pox   . History of hemorrhoids   . History of kidney stones   . Hyperlipidemia    under control  . Hypertension    under control  . Hypothyroidism   . Ileus ([place])   . Inguinal hernia   . OSA (obstructive sleep apnea)   . Pancreatitis   . PONV (postoperative nausea and vomiting)   . Rosacea   . 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    Past Surgical History:  Procedure Laterality Date  . ABDOMINAL HERNIA REPAIR  07/15/12   Dr [name]  . BROW LIFT  05/07/01  . CYSTOSCOPY WITH URETEROSCOPY AND STENT PLACEMENT Right 01/28/2014   Procedure: CYSTOSCOPY WITH URETEROSCOPY, BASKET RETRIVAL AND  STENT PLACEMENT;  Surgeon: [name], MD;  Location: [facility];  Service: Urology;  Laterality: Right;    . epidural injections     multiple procedures  . [place]   multiple times  . EYE SURGERY Bilateral 03/25/10, 2012   cataract removal  . HEMORRHOID SURGERY  2014  . HIATAL HERNIA REPAIR  12/21/93  . HOLMIUM LASER APPLICATION Right [id number]   Procedure: HOLMIUM LASER APPLICATION;  Surgeon: [name], MD;  Location: [facility];  Service: Urology;  Laterality: Right;  . INGUINAL HERNIA REPAIR Right 07/15/12   Dr [name], x2  . JOINT REPLACEMENT  07/25/04   right knee  . JOINT REPLACEMENT  07/13/10   left knee  . KNEE SURGERY  1995  . LAPAROSCOPIC CHOLECYSTECTOMY  09/20/06   with intraoperative cholangiogram and right inguinal herniorrhaphy with mesh   . LIGAMENT REPAIR Left 07/2013   shoulder  . LITHOTRIPSY     x2  . NASAL SEPTUM SURGERY  1975  . REFRACTIVE SURGERY Bilateral   . 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 SKIN CANCER DESTRUCTION     nose, ear  . TONSILLECTOMY  as child  . TOTAL HIP ARTHROPLASTY Left   . URETHRAL DILATION  1991   [name]    Family History  Problem Relation Age of Onset  .  Cancer Mother   . Hypertension Other   . Cancer Other   . Osteoarthritis Other     Social History   Socioeconomic History  . Marital status: Married    Spouse name: Not on file  . Number of children: 1  . Years of education: Not on file  . Highest education level: Not on file  Occupational History  . 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 Smoking status: Former Smoker    Years: 11.00    Types: Cigarettes    Quit date: 09/11/1978    Years since quitting: 41.3  . Smokeless tobacco: Never Used  Substance and Sexual Activity  . Alcohol use: Yes    Alcohol/week: 0.0 standard drinks    Comment: rare  . Drug use: No  . Sexual activity: Not on file  Other Topics Concern  . Not on file  Social History Narrative   Lives with his wife   He has one son- lives locally.   2 grandchildren   Has worked for Landscape architect   Enjoys wood working   Completed HS.  [employer]   Social Determinants of Radio broadcast assistant Strain:   . Difficulty of Paying Living Expenses:   Food Insecurity:   . Worried About Charity fundraiser in the Last Year:   . Arboriculturist in the Last Year:   Transportation Needs:   . Film/video editor (Medical):   [name] Lack of Transportation (Non-Medical):   Physical Activity:   . Days of Exercise per Week:   . Minutes of Exercise per Session:   Stress:   . Feeling of Stress :   Social Connections:   . Frequency of Communication with Friends and Family:   . Frequency of Social Gatherings with Friends and Family:   . Attends Religious Services:   . Active Member of Clubs or Organizations:   . Attends Archivist Meetings:   [name] Marital Status:   Intimate Partner Violence:   . Fear of Current or Ex-Partner:   . Emotionally Abused:   [name] Physically Abused:   . Sexually Abused:      PHYSICAL EXAM:  VS: BP (!) 126/54   Ht 5' 6.5" (1.689 m)   Wt 156 lb (70.8 kg)   BMI 24.80 kg/m  Physical Exam Gen: NAD, alert, cooperative with exam, well-appearing MSK:  Right and left leg: No signs of atrophy. Normal plantar flexion dorsiflexion. Normal strength resistance. Neurovascular intact     ASSESSMENT & PLAN:   Leg cramps Occurring intermittently.  May be associated with altered gait with chronic tibialis anterior tear.  May get improved with arthrodesis. -Counseled on home exercise therapy and supportive care. -Can continue gabapentin with 1 pill at night.  GOUT Uric acid is elevated 7.  May need to recheck at some point.  Tibialis anterior tendon tear, nontraumatic Physical therapy has helped with his balance.  He has been fitted for an orthosis. -Can continue physical therapy. -Counseled on home exercise therapy and supportive care. -Can follow-up as needed.

## 2020-01-22 NOTE — Assessment & Plan Note (Signed)
Uric acid is elevated 7.  May need to recheck at some point.

## 2020-01-22 NOTE — Assessment & Plan Note (Signed)
Physical therapy has helped with his balance.  He has been fitted for an orthosis. -Can continue physical therapy. -Counseled on home exercise therapy and supportive care. -Can follow-up as needed.

## 2020-01-22 NOTE — Assessment & Plan Note (Signed)
Occurring intermittently.  May be associated with altered gait with chronic tibialis anterior tear.  May get improved with arthrodesis. -Counseled on home exercise therapy and supportive care. -Can continue gabapentin with 1 pill at night.

## 2020-01-23 ENCOUNTER — Encounter: Payer: Medicare HMO | Admitting: Rehabilitative and Restorative Service Providers"

## 2020-01-26 ENCOUNTER — Encounter: Payer: Medicare HMO | Admitting: Rehabilitative and Restorative Service Providers"

## 2020-01-30 DIAGNOSIS — M21372 Foot drop, left foot: Secondary | ICD-10-CM | POA: Diagnosis not present

## 2020-01-31 ENCOUNTER — Other Ambulatory Visit: Payer: Self-pay | Admitting: Cardiovascular Disease

## 2020-02-02 ENCOUNTER — Ambulatory Visit: Payer: Medicare HMO | Admitting: Rehabilitative and Restorative Service Providers"

## 2020-02-02 ENCOUNTER — Other Ambulatory Visit: Payer: Self-pay

## 2020-02-02 ENCOUNTER — Encounter: Payer: Self-pay | Admitting: Rehabilitative and Restorative Service Providers"

## 2020-02-02 DIAGNOSIS — R2689 Other abnormalities of gait and mobility: Secondary | ICD-10-CM | POA: Diagnosis not present

## 2020-02-02 DIAGNOSIS — M6688 Spontaneous rupture of other tendons, other: Secondary | ICD-10-CM | POA: Diagnosis not present

## 2020-02-02 DIAGNOSIS — M66869 Spontaneous rupture of other tendons, unspecified lower leg: Secondary | ICD-10-CM

## 2020-02-02 DIAGNOSIS — M6281 Muscle weakness (generalized): Secondary | ICD-10-CM | POA: Diagnosis not present

## 2020-02-02 NOTE — Therapy (Signed)
[place], [place], [address] Phone: [phone]   Fax:  [phone]  Physical Therapy Treatment/Recert and Discharge Summary  Patient Details  Name: [name] Pratt MRN: [id number] Date of Birth: [birth year] Referring Provider (PT): Clearance Coots, MD   Encounter Date: 02/02/2020  PT End of Session - 02/02/20 1105    Visit Number  8    Number of Visits  12    Date for PT Re-Evaluation  01/30/20    PT Start Time  1102    PT Stop Time  1145    PT Time Calculation (min)  43 min    Activity Tolerance  Patient tolerated treatment well    Behavior During Therapy  [facility] for tasks assessed/performed       Past Medical History:  Diagnosis Date  . Arthritis   . Cancer ([place])    skin cancer  . Carotid stenosis    a. Carotid U/S 1/49: LICA < 70%, RICA 26-37%;  b.  Carotid U/S 8/58:  RICA 85-02%; LICA 7-74%; f/u 1 year  . Chest pain     Myoview in 2008 was normal.  Echo 7/09: EF 60%, normal wall motion, mild LVH, mild LAE.  [name] Complication of anesthesia    "stomach does not wake up"  . COPD (chronic obstructive pulmonary disease) ([place])   . GERD (gastroesophageal reflux disease)   . Gout   . History of chicken pox   . History of hemorrhoids   . History of kidney stones   . Hyperlipidemia    under control  . Hypertension    under control  . Hypothyroidism   . Ileus ([place])   . Inguinal hernia   . OSA (obstructive sleep apnea)   . Pancreatitis   . PONV (postoperative nausea and vomiting)   . Rosacea   . 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    Past Surgical History:  Procedure Laterality Date  . ABDOMINAL HERNIA REPAIR  07/15/12   Dr [name]  . BROW LIFT  05/07/01  . CYSTOSCOPY WITH URETEROSCOPY AND STENT PLACEMENT Right 01/28/2014   Procedure: CYSTOSCOPY WITH URETEROSCOPY, BASKET RETRIVAL AND  STENT PLACEMENT;  Surgeon: [name], MD;  Location:  [facility];  Service: Urology;  Laterality: Right;  . epidural injections     multiple procedures  . [place]   multiple times  . EYE SURGERY Bilateral 03/25/10, 2012   cataract removal  . HEMORRHOID SURGERY  2014  . HIATAL HERNIA REPAIR  12/21/93  . HOLMIUM LASER APPLICATION Right 10/08/7865   Procedure: HOLMIUM LASER APPLICATION;  Surgeon: [name], MD;  Location: [facility];  Service: Urology;  Laterality: Right;  . INGUINAL HERNIA REPAIR Right 07/15/12   Dr [name], x2  . JOINT REPLACEMENT  07/25/04   right knee  . JOINT REPLACEMENT  07/13/10   left knee  . KNEE SURGERY  1995  . LAPAROSCOPIC CHOLECYSTECTOMY  09/20/06   with intraoperative cholangiogram and right inguinal herniorrhaphy with mesh   . LIGAMENT REPAIR Left 07/2013   shoulder  . LITHOTRIPSY     x2  . NASAL SEPTUM SURGERY  1975  . REFRACTIVE SURGERY Bilateral   . 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 SKIN CANCER DESTRUCTION     nose, ear  . TONSILLECTOMY  as child  . TOTAL HIP ARTHROPLASTY Left   . [place]  [name]    There were no vitals filed for this visit.  Subjective Assessment - 02/02/20 1103    Subjective  The patient picked up AFO on Friday.  He is trying to get used to the AFO and wore it all day saturday.  He notes that he cannot squat in the AFO well in the garage.  PT and patient discussed wearing AFO for unlevel surface negotiation andlonger walk.  He is continuing to do HEP.    Pertinent History  bilateral knee replacements (over 10 years ago), bilateral hip replacements, HTN, hyperlipidemia, neuropathy, h/o double vision (reports he pulled an eye muscle in the past)    Patient Stated Goals  Get stronger in legs and arms for improved mobility.    Currently in Pain?  No/denies         [facility] PT Assessment - 02/02/20 1119      Assessment   Medical Diagnosis  leg cramps, tibialis anterior tendon tear    Referring Provider (PT)  Clearance Coots, MD    Onset  Date/Surgical Date  12/11/19                    [facility] Adult PT Treatment/Exercise - 02/02/20 1119      Ambulation/Gait   Ambulation/Gait  Yes    Ambulation/Gait Assistance  6: Modified independent (Device/Increase time)    Ambulation Distance (Feet)  1500 Feet    Gait velocity  3.27 ft/sec with AFO donned L LE    Gait Comments  patient performed gait activities in community and in clinic without foot slap with AFO donned.  PT discussed continuing to increase walking tolerance to help with fatigue recommending 5-8 minute initial HEP and progressing to tolerance.      Exercises   Exercises  Knee/Hip;Ankle      Lumbar Exercises: Standing   Other Standing Lumbar Exercises  single leg stance      Knee/Hip Exercises: Stretches   Gastroc Stretch  Right;Left;1 rep;20 seconds    Gastroc Stretch Limitations  no AFO      Knee/Hip Exercises: Standing   Heel Raises  1 set;10 reps    SLS  10 second holds without AFO donned    Other Standing Knee Exercises  toe raises x 10 reps      Ankle Exercises: Seated   Other Seated Ankle Exercises  ankle eversion x 15 reps R and L with red theraband and cues to reduce hip movement -- no AFO             PT Education - 02/02/20 1301    Education Details  discussed home walking program and use of AFO    Person(s) Educated  Patient    Methods  Explanation;Demonstration;Handout    Comprehension  Verbalized understanding;Returned demonstration          PT Long Term Goals - 02/02/20 1134      PT LONG TERM GOAL #1   Title  The patient will be indep with HEP.    Time  6    Period  Weeks    Status  Achieved      PT LONG TERM GOAL #2   Title  The patient will reduce limitation per FOTO from 39% to < or equal to 29%.    Baseline  Improved to 71% (29% limitation)    Time  6    Period  Weeks    Status  Achieved      PT LONG TERM GOAL #  3   Title  The patient will improve gait speed from 2.83 ft/sec to > or equal to 3.2 ft/sec.     Baseline  3.27 ft/sec    Time  6    Period  Weeks    Status  Achieved      PT LONG TERM GOAL #4   Title  The patient will maintain single leg stance x 10 seconds bilateraly.    Baseline  Patient able to perform 10 seconds on 3rd trial.    Time  6    Period  Weeks    Status  Achieved      PT LONG TERM GOAL #5   Title  The patient will return demo floor <> stand with one UE support on surface to rise.    Time  6    Period  Weeks    Status  Achieved            Plan - 02/02/20 1138    Clinical Impression Statement  The patient met LTGs with independence with HEP.  He brought new AFO to clinic today for assessment and he demonstrates ability to negotiate extended community surfaces, unlevel grass surfaces and curbs with L AFO mod indep.  He notes fatigue with extended gait from his knees down bilaterally.  PT encouraged walking program (using AFO to compensate for L foot drop) to progress conditioning program.  We reviewed all HEP and noted which ones to wear AFO with and without.  Patient to continue HEP + walking program + use of AFO for extended ambulation.    PT Treatment/Interventions  ADLs/Self Care Home Management;Gait training;Stair training;Functional mobility training;Therapeutic activities;Therapeutic exercise;Balance training;Neuromuscular re-education;Manual techniques;Patient/family education;Dry needling    PT Next Visit Plan  discharge today.    PT Home Exercise Plan  Access Code: [email or web address]    Consulted and Agree with Plan of Care  Patient       Patient will benefit from skilled therapeutic intervention in order to improve the following deficits and impairments:  Abnormal gait, Decreased strength, Impaired flexibility, Decreased range of motion, Decreased activity tolerance  Visit Diagnosis: Tibialis anterior tendon tear, nontraumatic - Plan: PT plan of care cert/re-cert  Other abnormalities of gait and mobility - Plan: PT plan of care cert/re-cert  Muscle  weakness (generalized) - Plan: PT plan of care cert/re-cert  PHYSICAL THERAPY DISCHARGE SUMMARY  Visits from Start of Care: 8  Current functional level related to goals / functional outcomes: See above   Remaining deficits: Chronic L anterior tibialis weakness  Dec' d high level balance Gait abnormalities (foot drop addressed with L AFO)   Education / Equipment: HEP, use of AFO, progression of home walking, fall prevention  Plan: Patient agrees to discharge.  Patient goals were partially met. Patient is being discharged due to meeting the stated rehab goals.  ?????        Problem List Patient Active Problem List   Diagnosis Date Noted  . Leg cramps 11/13/2019  . Tibialis anterior tendon tear, nontraumatic 11/13/2019  . 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 CPAP (continuous positive airway pressure) dependence 03/25/2018  . 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 Acute idiopathic gout of hand 03/25/2018  . Erectile dysfunction 06/20/2017  . 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 Peripheral neuropathy 06/19/2016  . Hearing loss 02/02/2016  . 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 Allergic rhinitis 02/02/2016  . Atypical chest pain 11/24/2015  . Preventative  health care 11/24/2015  . Onychomycosis 06/30/2015  . Degenerative disc disease, lumbar 06/30/2015  . Other allergic rhinitis 02/18/2015  . Deviated nasal septum 02/18/2015  . OSA on CPAP 02/18/2015  . RLS (restless legs syndrome) 02/18/2015  . GERD (gastroesophageal reflux disease) 09/18/2014  . Hyperglycemia 03/03/2014  . Sleep apnea with use of continuous positive airway pressure (CPAP) 07/15/2013  . Carotid stenosis 02/24/2013  . Nonspecific abnormal electrocardiogram (ECG) (EKG) 01/06/2013  . DJD (degenerative joint disease) of hip 12/24/2012  . Lower GI bleed 12/12/2012  . Dyspnea 06/12/2011  . Hypothyroidism 05/13/2009  . HYPERCHOLESTEROLEMIA  05/13/2009  . Hyperlipidemia 05/13/2009  . 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  . Essential hypertension 05/13/2009  . INGUINAL HERNIA 05/13/2009  . HIATAL HERNIA 05/13/2009  . NEPHROLITHIASIS 05/13/2009  . ROSACEA 05/13/2009   Thank you for the referral of this patient. [name], MPT  [place], PT 02/02/2020, 1:42 PM  [facility] [place], [place], [address] Phone: [phone]   Fax:  [phone]  Name: BRIANA NEWMAN MRN: [id number] Date of Birth: 03/30/1941

## 2020-02-11 DIAGNOSIS — G4733 Obstructive sleep apnea (adult) (pediatric): Secondary | ICD-10-CM | POA: Diagnosis not present

## 2020-02-17 ENCOUNTER — Ambulatory Visit: Payer: Medicare HMO | Admitting: Family

## 2020-02-20 ENCOUNTER — Encounter: Payer: Self-pay | Admitting: Family

## 2020-02-20 ENCOUNTER — Ambulatory Visit (INDEPENDENT_AMBULATORY_CARE_PROVIDER_SITE_OTHER): Payer: Medicare HMO | Admitting: Family

## 2020-02-20 ENCOUNTER — Other Ambulatory Visit: Payer: Self-pay

## 2020-02-20 VITALS — BP 127/62 | HR 57 | Temp 97.3°F | Resp 16 | Ht 66.5 in | Wt 158.0 lb

## 2020-02-20 DIAGNOSIS — I10 Essential (primary) hypertension: Secondary | ICD-10-CM | POA: Diagnosis not present

## 2020-02-20 DIAGNOSIS — R5383 Other fatigue: Secondary | ICD-10-CM | POA: Diagnosis not present

## 2020-02-20 DIAGNOSIS — E039 Hypothyroidism, unspecified: Secondary | ICD-10-CM | POA: Diagnosis not present

## 2020-02-20 DIAGNOSIS — E785 Hyperlipidemia, unspecified: Secondary | ICD-10-CM | POA: Diagnosis not present

## 2020-02-20 DIAGNOSIS — M109 Gout, unspecified: Secondary | ICD-10-CM | POA: Diagnosis not present

## 2020-02-20 NOTE — Progress Notes (Signed)
Subjective:    Patient ID: [name] Pratt, male    DOB: [birth year], 79 y.o.   MRN: [id number]  HPI   Patient is an 79 year old male who presents today for follow up. He reports that he recently completed physical therapy for balance/gait training.    HTN- BP meds include hydralazine, hyzaar BP Readings from Last 3 Encounters:  02/20/20 127/62  01/22/20 (!) 126/54  12/11/19 (!) 157/72   Hypothyroid- maintained on synthroid.  Lab Results  Component Value Date   TSH 1.41 09/19/2019   Hyperlipidemia-  On crestor/fenofibrate once daily.  Leg cramping is improved.   Lab Results  Component Value Date   CHOL 120 09/19/2019   HDL 42.80 09/19/2019   LDLCALC 63 09/19/2019   TRIG 71.0 09/19/2019   CHOLHDL 3 09/19/2019   Gout- denies gout symptoms.   He is on allopurinol.   He does report occasional fatigue- comes and goes. States sometimes he has a lot of energy.   Review of Systems See HPI  Past Medical History:  Diagnosis Date  . Arthritis   . Cancer ([place])    skin cancer  . Carotid stenosis    a. Carotid U/S 2/70: LICA < 35%, RICA 00-93%;  b.  Carotid U/S 8/18:  RICA 29-93%; LICA 7-16%; f/u 1 year  . Chest pain     Myoview in 2008 was normal.  Echo 7/09: EF 60%, normal wall motion, mild LVH, mild LAE.  [name] Complication of anesthesia    "stomach does not wake up"  . COPD (chronic obstructive pulmonary disease) ([place])   . GERD (gastroesophageal reflux disease)   . Gout   . History of chicken pox   . History of hemorrhoids   . History of kidney stones   . Hyperlipidemia    under control  . Hypertension    under control  . Hypothyroidism   . Ileus ([place])   . Inguinal hernia   . OSA (obstructive sleep apnea)   . Pancreatitis   . PONV (postoperative nausea and vomiting)   . Rosacea   . 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     Social History   Socioeconomic History  . Marital status: Married    Spouse name: Not on  file  . Number of children: 1  . Years of education: Not on file  . Highest education level: Not on file  Occupational History  . 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 Smoking status: Former Smoker    Years: 11.00    Types: Cigarettes    Quit date: 09/11/1978    Years since quitting: 41.4  . Smokeless tobacco: Never Used  Vaping Use  . Vaping Use: Never used  Substance and Sexual Activity  . Alcohol use: Yes    Alcohol/week: 0.0 standard drinks    Comment: rare  . Drug use: No  . Sexual activity: Not on file  Other Topics Concern  . Not on file  Social History Narrative   Lives with his wife   He has one son- lives locally.   2 grandchildren   Has worked for Research officer, trade union   Enjoys wood working   Completed HS.  [employer]   Social Determinants of Radio broadcast assistant Strain:   . Difficulty of Paying Living Expenses:   Food Insecurity:   . Worried About Charity fundraiser in the Last  Year:   . Ran Out of Food in the Last Year:   Transportation Needs:   . Film/video editor (Medical):   [name] Lack of Transportation (Non-Medical):   Physical Activity:   . Days of Exercise per Week:   . Minutes of Exercise per Session:   Stress:   . Feeling of Stress :   Social Connections:   . Frequency of Communication with Friends and Family:   . Frequency of Social Gatherings with Friends and Family:   . Attends Religious Services:   . Active Member of Clubs or Organizations:   . Attends Archivist Meetings:   [name] Marital Status:   Intimate Partner Violence:   . Fear of Current or Ex-Partner:   . Emotionally Abused:   [name] Physically Abused:   . Sexually Abused:     Past Surgical History:  Procedure Laterality Date  . ABDOMINAL HERNIA REPAIR  07/15/12   Dr [name]  . BROW LIFT  05/07/01  . CYSTOSCOPY WITH URETEROSCOPY AND STENT PLACEMENT Right 01/28/2014   Procedure: CYSTOSCOPY WITH URETEROSCOPY, BASKET RETRIVAL AND  STENT  PLACEMENT;  Surgeon: [name], MD;  Location: [facility];  Service: Urology;  Laterality: Right;  . epidural injections     multiple procedures  . [place]   multiple times  . EYE SURGERY Bilateral 03/25/10, 2012   cataract removal  . HEMORRHOID SURGERY  2014  . HIATAL HERNIA REPAIR  12/21/93  . HOLMIUM LASER APPLICATION Right 12/03/4980   Procedure: HOLMIUM LASER APPLICATION;  Surgeon: [name], MD;  Location: [facility];  Service: Urology;  Laterality: Right;  . INGUINAL HERNIA REPAIR Right 07/15/12   Dr [name], x2  . JOINT REPLACEMENT  07/25/04   right knee  . JOINT REPLACEMENT  07/13/10   left knee  . KNEE SURGERY  1995  . LAPAROSCOPIC CHOLECYSTECTOMY  09/20/06   with intraoperative cholangiogram and right inguinal herniorrhaphy with mesh   . LIGAMENT REPAIR Left 07/2013   shoulder  . LITHOTRIPSY     x2  . NASAL SEPTUM SURGERY  1975  . REFRACTIVE SURGERY Bilateral   . 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 SKIN CANCER DESTRUCTION     nose, ear  . TONSILLECTOMY  as child  . TOTAL HIP ARTHROPLASTY Left   . URETHRAL DILATION  1991   [name]    Family History  Problem Relation Age of Onset  . Cancer Mother   . Hypertension Other   . Cancer Other   . Osteoarthritis Other     Allergies  Allergen Reactions  . Metoclopramide Hcl     anxiety  . Pravastatin     Muscle cramps  . Sildenafil     Tachycardia      Current Outpatient Medications on File Prior to Visit  Medication Sig Dispense Refill  . albuterol (PROVENTIL HFA;VENTOLIN HFA) 108 (90 Base) MCG/ACT inhaler Inhale 2 puffs into the lungs every 6 (six) hours as needed for shortness of breath. 3 Inhaler 1  . allopurinol (ZYLOPRIM) 100 MG tablet Take 1 tablet (100 mg total) by mouth 2 (two) times daily. 180 tablet 1  . amLODipine (NORVASC) 10 MG tablet TAKE 1 TABLET EVERY DAY 90 tablet 0  . aspirin EC 81 MG tablet Take 81 mg by mouth every morning.    . Carboxymethylcellul-Glycerin  (OPTIVE) 0.5-0.9 % SOLN Apply 1 drop to eye daily.    . Colchicine (MITIGARE) 0.6 MG CAPS Take  1 capsule by mouth daily. 90 capsule 1  . Cranberry 500 MG TABS Take 1 tablet by mouth daily.    . cycloSPORINE (RESTASIS) 0.05 % ophthalmic emulsion Place 1 drop into both eyes 2 (two) times daily.    . diclofenac sodium (VOLTAREN) 1 % GEL Apply 1 application topically daily as needed (arthritis). 100 g 2  . docusate sodium (COLACE) 100 MG capsule Take 1 capsule (100 mg total) by mouth 2 (two) times daily as needed for mild constipation. 10 capsule 0  . fenofibrate (TRICOR) 145 MG tablet TAKE 1 TABLET EVERY DAY 90 tablet 1  . fluticasone (FLONASE) 50 MCG/ACT nasal spray Place 2 sprays into both nostrils daily as needed for allergies or rhinitis. 16 g 6  . gabapentin (NEURONTIN) 300 MG capsule Take 300 mg capsule 2 to 3 times a day as needed. 60 capsule 1  . hydrALAZINE (APRESOLINE) 50 MG tablet TAKE 1 TABLET THREE TIMES DAILY 270 tablet 1  . IRON PO Take 50 mcg by mouth daily.    [name] levothyroxine (SYNTHROID) 150 MCG tablet TAKE 1 TABLET ONE TIME DAILY ON 6 DAYS PER WEEK AND 1/2 TABLET ON 1 DAY PER WEEK 78 tablet 1  . losartan-hydrochlorothiazide (HYZAAR) 100-25 MG tablet Take 1 tablet by mouth daily. 90 tablet 3  . multivitamin (THERAGRAN) per tablet Take 1 tablet by mouth daily.      [name] MYRBETRIQ 25 MG TB24 tablet     . nitroGLYCERIN (NITROSTAT) 0.4 MG SL tablet Place 1 tablet (0.4 mg total) under the tongue every 5 (five) minutes as needed for chest pain. 25 tablet 3  . Omega-3 Fatty Acids (FISH OIL PO) Take 1 capsule by mouth 2 (two) times daily.    . rosuvastatin (CRESTOR) 5 MG tablet Take 1 tablet by mouth once daily 30 tablet 5  . sodium chloride (OCEAN) 0.65 % nasal spray Place 1 spray into both nostrils at bedtime as needed for congestion.    . [DISCONTINUED] allopurinol (ZYLOPRIM) 100 MG tablet Take 1 tablet (100 mg total) by mouth daily. 90 tablet 1   No current facility-administered  medications on file prior to visit.    BP 127/62 (BP Location: Right Arm, Patient Position: Sitting, Cuff Size: Small)   Pulse (!) 57   Temp (!) 97.3 F (36.3 C) (Temporal)   Resp 16   Ht 5' 6.5" (1.689 m)   Wt 158 lb (71.7 kg)   SpO2 99%   BMI 25.12 kg/m       Objective:   Physical Exam Constitutional:      General: He is not in acute distress.    Appearance: He is well-developed.  HENT:     Head: Normocephalic and atraumatic.  Cardiovascular:     Rate and Rhythm: Normal rate and regular rhythm.     Heart sounds: No murmur heard.   Pulmonary:     Effort: Pulmonary effort is normal. No respiratory distress.     Breath sounds: Normal breath sounds. No wheezing or rales.  Skin:    General: Skin is warm and dry.  Neurological:     Mental Status: He is alert and oriented to person, place, and time.  Psychiatric:        Behavior: Behavior normal.        Thought Content: Thought content normal.           Assessment & Plan:  Fatigue- check cbc, tsh, bmet.  HTN- bp stable on current meds. Continue same.  Hypothyroid-  obtain follow up tsh.  Hyperlipidemia- LDL at goal on statin.  Gout- stable on allopurinol. Continue same.  This visit occurred during the SARS-CoV-2 public health emergency.  Safety protocols were in place, including screening questions prior to the visit, additional usage of staff PPE, and extensive cleaning of exam room while observing appropriate contact time as indicated for disinfecting solutions.

## 2020-02-21 ENCOUNTER — Other Ambulatory Visit: Payer: Self-pay | Admitting: Family

## 2020-02-21 LAB — BASIC METABOLIC PANEL
BUN/Creatinine Ratio: 23 (calc) — ABNORMAL HIGH (ref 6–22)
BUN: 44 mg/dL — ABNORMAL HIGH (ref 7–25)
CO2: 26 mmol/L (ref 20–32)
Calcium: 9.5 mg/dL (ref 8.6–10.3)
Chloride: 105 mmol/L (ref 98–110)
Creat: 1.93 mg/dL — ABNORMAL HIGH (ref 0.70–1.11)
Glucose, Bld: 106 mg/dL — ABNORMAL HIGH (ref 65–99)
Potassium: 3.8 mmol/L (ref 3.5–5.3)
Sodium: 140 mmol/L (ref 135–146)

## 2020-02-21 LAB — CBC WITH DIFFERENTIAL/PLATELET
Absolute Monocytes: 634 cells/uL (ref 200–950)
Basophils Absolute: 53 cells/uL (ref 0–200)
Basophils Relative: 0.8 %
Eosinophils Absolute: 158 cells/uL (ref 15–500)
Eosinophils Relative: 2.4 %
HCT: 34.1 % — ABNORMAL LOW (ref 38.5–50.0)
Hemoglobin: 11.6 g/dL — ABNORMAL LOW (ref 13.2–17.1)
Lymphs Abs: 1564 cells/uL (ref 850–3900)
MCH: 33.2 pg — ABNORMAL HIGH (ref 27.0–33.0)
MCHC: 34 g/dL (ref 32.0–36.0)
MCV: 97.7 fL (ref 80.0–100.0)
MPV: 9.1 fL (ref 7.5–12.5)
Monocytes Relative: 9.6 %
Neutro Abs: 4191 cells/uL (ref 1500–7800)
Neutrophils Relative %: 63.5 %
Platelets: 382 10*3/uL (ref 140–400)
RBC: 3.49 10*6/uL — ABNORMAL LOW (ref 4.20–5.80)
RDW: 12.7 % (ref 11.0–15.0)
Total Lymphocyte: 23.7 %
WBC: 6.6 10*3/uL (ref 3.8–10.8)

## 2020-02-21 LAB — TSH: TSH: 1.38 mIU/L (ref 0.40–4.50)

## 2020-02-22 ENCOUNTER — Telehealth: Payer: Self-pay | Admitting: Family

## 2020-02-22 NOTE — Telephone Encounter (Signed)
Please contact patient and let him know that his kidney function is stable.  He is still mildly anemic.  I would like for him to complete an IFOB, diagnosis anemia please

## 2020-02-23 NOTE — Telephone Encounter (Signed)
Advised patient of results and provider's comments. Will come pick up IFOB kit, order entered as future

## 2020-02-26 ENCOUNTER — Other Ambulatory Visit: Payer: Self-pay | Admitting: Family

## 2020-02-27 ENCOUNTER — Telehealth: Payer: Self-pay | Admitting: *Deleted

## 2020-02-27 ENCOUNTER — Other Ambulatory Visit: Payer: Self-pay | Admitting: *Deleted

## 2020-02-27 ENCOUNTER — Other Ambulatory Visit (INDEPENDENT_AMBULATORY_CARE_PROVIDER_SITE_OTHER): Payer: Medicare HMO

## 2020-02-27 DIAGNOSIS — D649 Anemia, unspecified: Secondary | ICD-10-CM | POA: Diagnosis not present

## 2020-02-27 DIAGNOSIS — G4733 Obstructive sleep apnea (adult) (pediatric): Secondary | ICD-10-CM | POA: Diagnosis not present

## 2020-02-27 LAB — FECAL OCCULT BLOOD, IMMUNOCHEMICAL: Fecal Occult Bld: POSITIVE — AB

## 2020-02-27 NOTE — Progress Notes (Signed)
Lab called stating they have a positive IFOB on pt but can't result the test until a future order has been placed for [facility]le[facility] collect. Dx code anemia per PCP and order placed.

## 2020-02-27 NOTE — Telephone Encounter (Signed)
Received call from [facility] lab that pt has a positive IFOB but they cannot result the test as there are no future orders in Epic for this test. What dx are we using and I will place order?

## 2020-02-27 NOTE — Telephone Encounter (Signed)
Order placed

## 2020-02-27 NOTE — Telephone Encounter (Signed)
Dx anemia. Tks.

## 2020-02-29 ENCOUNTER — Telehealth: Payer: Self-pay | Admitting: Family

## 2020-02-29 DIAGNOSIS — R195 Other fecal abnormalities: Secondary | ICD-10-CM

## 2020-02-29 HISTORY — DX: Other fecal abnormalities: R19.5

## 2020-02-29 NOTE — Telephone Encounter (Signed)
Please contact pt and let him know that his stool shows hidden blood. I would recommend he see GI for further evaluation.

## 2020-03-01 ENCOUNTER — Telehealth: Payer: Self-pay

## 2020-03-01 NOTE — Telephone Encounter (Signed)
Patient advised of results and he will call his GI Doctor for appointment

## 2020-03-01 NOTE — Telephone Encounter (Signed)
Patient called in to speak with the nurse to discuss his lab results please give the patient a call back at [phone]

## 2020-03-01 NOTE — Telephone Encounter (Signed)
Patient concerned about a positive IFOB result, advised patient we will call him back once we have the provider's comments and advise

## 2020-03-03 ENCOUNTER — Emergency Department (HOSPITAL_BASED_OUTPATIENT_CLINIC_OR_DEPARTMENT_OTHER): Payer: Medicare HMO

## 2020-03-03 ENCOUNTER — Other Ambulatory Visit: Payer: Self-pay

## 2020-03-03 ENCOUNTER — Encounter (HOSPITAL_BASED_OUTPATIENT_CLINIC_OR_DEPARTMENT_OTHER): Payer: Self-pay

## 2020-03-03 ENCOUNTER — Observation Stay (HOSPITAL_BASED_OUTPATIENT_CLINIC_OR_DEPARTMENT_OTHER)
Admission: EM | Admit: 2020-03-03 | Discharge: 2020-03-05 | Disposition: A | Discharge status: Home / Self Care | Payer: Medicare HMO | Attending: Family Medicine | Admitting: Family Medicine

## 2020-03-03 DIAGNOSIS — Z8719 Personal history of other diseases of the digestive system: Secondary | ICD-10-CM | POA: Insufficient documentation

## 2020-03-03 DIAGNOSIS — Z79899 Other long term (current) drug therapy: Secondary | ICD-10-CM | POA: Diagnosis not present

## 2020-03-03 DIAGNOSIS — J9811 Atelectasis: Secondary | ICD-10-CM | POA: Insufficient documentation

## 2020-03-03 DIAGNOSIS — R634 Abnormal weight loss: Secondary | ICD-10-CM

## 2020-03-03 DIAGNOSIS — K449 Diaphragmatic hernia without obstruction or gangrene: Secondary | ICD-10-CM | POA: Insufficient documentation

## 2020-03-03 DIAGNOSIS — R509 Fever, unspecified: Principal | ICD-10-CM | POA: Insufficient documentation

## 2020-03-03 DIAGNOSIS — Z87891 Personal history of nicotine dependence: Secondary | ICD-10-CM | POA: Diagnosis not present

## 2020-03-03 DIAGNOSIS — Z85828 Personal history of other malignant neoplasm of skin: Secondary | ICD-10-CM | POA: Diagnosis not present

## 2020-03-03 DIAGNOSIS — R5383 Other fatigue: Secondary | ICD-10-CM | POA: Diagnosis not present

## 2020-03-03 DIAGNOSIS — I5032 Chronic diastolic (congestive) heart failure: Secondary | ICD-10-CM | POA: Insufficient documentation

## 2020-03-03 DIAGNOSIS — M109 Gout, unspecified: Secondary | ICD-10-CM | POA: Insufficient documentation

## 2020-03-03 DIAGNOSIS — E039 Hypothyroidism, unspecified: Secondary | ICD-10-CM | POA: Insufficient documentation

## 2020-03-03 DIAGNOSIS — G4733 Obstructive sleep apnea (adult) (pediatric): Secondary | ICD-10-CM | POA: Insufficient documentation

## 2020-03-03 DIAGNOSIS — N1832 Chronic kidney disease, stage 3b: Secondary | ICD-10-CM

## 2020-03-03 DIAGNOSIS — R531 Weakness: Secondary | ICD-10-CM | POA: Diagnosis not present

## 2020-03-03 DIAGNOSIS — Z20822 Contact with and (suspected) exposure to covid-19: Secondary | ICD-10-CM | POA: Insufficient documentation

## 2020-03-03 DIAGNOSIS — I700 Atherosclerosis of aorta: Secondary | ICD-10-CM | POA: Diagnosis not present

## 2020-03-03 DIAGNOSIS — E876 Hypokalemia: Secondary | ICD-10-CM | POA: Diagnosis not present

## 2020-03-03 DIAGNOSIS — Z96653 Presence of artificial knee joint, bilateral: Secondary | ICD-10-CM | POA: Insufficient documentation

## 2020-03-03 DIAGNOSIS — Z8249 Family history of ischemic heart disease and other diseases of the circulatory system: Secondary | ICD-10-CM | POA: Insufficient documentation

## 2020-03-03 DIAGNOSIS — I160 Hypertensive urgency: Secondary | ICD-10-CM | POA: Insufficient documentation

## 2020-03-03 DIAGNOSIS — J432 Centrilobular emphysema: Secondary | ICD-10-CM | POA: Diagnosis not present

## 2020-03-03 DIAGNOSIS — M4810 Ankylosing hyperostosis [Forestier], site unspecified: Secondary | ICD-10-CM | POA: Insufficient documentation

## 2020-03-03 DIAGNOSIS — R110 Nausea: Secondary | ICD-10-CM

## 2020-03-03 DIAGNOSIS — K648 Other hemorrhoids: Secondary | ICD-10-CM

## 2020-03-03 DIAGNOSIS — Z888 Allergy status to other drugs, medicaments and biological substances status: Secondary | ICD-10-CM | POA: Insufficient documentation

## 2020-03-03 DIAGNOSIS — D649 Anemia, unspecified: Secondary | ICD-10-CM | POA: Insufficient documentation

## 2020-03-03 DIAGNOSIS — R6510 Systemic inflammatory response syndrome (SIRS) of non-infectious origin without acute organ dysfunction: Secondary | ICD-10-CM | POA: Insufficient documentation

## 2020-03-03 DIAGNOSIS — E785 Hyperlipidemia, unspecified: Secondary | ICD-10-CM | POA: Diagnosis not present

## 2020-03-03 DIAGNOSIS — J439 Emphysema, unspecified: Secondary | ICD-10-CM | POA: Insufficient documentation

## 2020-03-03 DIAGNOSIS — Z7982 Long term (current) use of aspirin: Secondary | ICD-10-CM | POA: Insufficient documentation

## 2020-03-03 DIAGNOSIS — I130 Hypertensive heart and chronic kidney disease with heart failure and stage 1 through stage 4 chronic kidney disease, or unspecified chronic kidney disease: Secondary | ICD-10-CM | POA: Insufficient documentation

## 2020-03-03 DIAGNOSIS — Z96643 Presence of artificial hip joint, bilateral: Secondary | ICD-10-CM | POA: Insufficient documentation

## 2020-03-03 DIAGNOSIS — R195 Other fecal abnormalities: Secondary | ICD-10-CM | POA: Diagnosis not present

## 2020-03-03 DIAGNOSIS — Z9889 Other specified postprocedural states: Secondary | ICD-10-CM | POA: Diagnosis not present

## 2020-03-03 DIAGNOSIS — K5790 Diverticulosis of intestine, part unspecified, without perforation or abscess without bleeding: Secondary | ICD-10-CM | POA: Insufficient documentation

## 2020-03-03 HISTORY — DX: Other hemorrhoids: K64.8

## 2020-03-03 HISTORY — DX: Abnormal weight loss: R63.4

## 2020-03-03 HISTORY — DX: Diverticulosis of intestine, part unspecified, without perforation or abscess without bleeding: K57.90

## 2020-03-03 LAB — CBC WITH DIFFERENTIAL/PLATELET
Abs Immature Granulocytes: 0.02 10*3/uL (ref 0.00–0.07)
Basophils Absolute: 0 10*3/uL (ref 0.0–0.1)
Basophils Relative: 1 %
Eosinophils Absolute: 0 10*3/uL (ref 0.0–0.5)
Eosinophils Relative: 0 %
HCT: 38 % — ABNORMAL LOW (ref 39.0–52.0)
Hemoglobin: 12.4 g/dL — ABNORMAL LOW (ref 13.0–17.0)
Immature Granulocytes: 0 %
Lymphocytes Relative: 12 %
Lymphs Abs: 0.8 10*3/uL (ref 0.7–4.0)
MCH: 32 pg (ref 26.0–34.0)
MCHC: 32.6 g/dL (ref 30.0–36.0)
MCV: 98.2 fL (ref 80.0–100.0)
Monocytes Absolute: 0.5 10*3/uL (ref 0.1–1.0)
Monocytes Relative: 8 %
Neutro Abs: 5.1 10*3/uL (ref 1.7–7.7)
Neutrophils Relative %: 79 %
Platelets: 351 10*3/uL (ref 150–400)
RBC: 3.87 MIL/uL — ABNORMAL LOW (ref 4.22–5.81)
RDW: 13.4 % (ref 11.5–15.5)
WBC: 6.4 10*3/uL (ref 4.0–10.5)
nRBC: 0 % (ref 0.0–0.2)

## 2020-03-03 LAB — COMPREHENSIVE METABOLIC PANEL
ALT: 29 U/L (ref 0–44)
AST: 46 U/L — ABNORMAL HIGH (ref 15–41)
Albumin: 4.2 g/dL (ref 3.5–5.0)
Alkaline Phosphatase: 41 U/L (ref 38–126)
Anion gap: 11 (ref 5–15)
BUN: 39 mg/dL — ABNORMAL HIGH (ref 8–23)
CO2: 24 mmol/L (ref 22–32)
Calcium: 9.2 mg/dL (ref 8.9–10.3)
Chloride: 100 mmol/L (ref 98–111)
Creatinine, Ser: 1.98 mg/dL — ABNORMAL HIGH (ref 0.61–1.24)
GFR calc Af Amer: 36 mL/min — ABNORMAL LOW (ref >60)
GFR calc non Af Amer: 31 mL/min — ABNORMAL LOW (ref >60)
Glucose, Bld: 156 mg/dL — ABNORMAL HIGH (ref 70–99)
Potassium: 3.3 mmol/L — ABNORMAL LOW (ref 3.5–5.1)
Sodium: 135 mmol/L (ref 135–145)
Total Bilirubin: 0.5 mg/dL (ref 0.3–1.2)
Total Protein: 7.9 g/dL (ref 6.5–8.1)

## 2020-03-03 LAB — LACTIC ACID, PLASMA: Lactic Acid, Venous: 0.9 mmol/L (ref 0.5–1.9)

## 2020-03-03 MED ORDER — SODIUM CHLORIDE 0.9 % IV SOLN
1.0000 g | Freq: Once | INTRAVENOUS | Status: AC
  Administered 2020-03-03: 1 g via INTRAVENOUS
  Filled 2020-03-03: qty 10

## 2020-03-03 MED ORDER — ONDANSETRON 4 MG PO TBDP
4.0000 mg | ORAL_TABLET | Freq: Once | ORAL | Status: AC
  Administered 2020-03-03: 4 mg via ORAL
  Filled 2020-03-03: qty 1

## 2020-03-03 MED ORDER — SODIUM CHLORIDE 0.9 % IV SOLN
500.0000 mg | Freq: Once | INTRAVENOUS | Status: AC
  Administered 2020-03-04: 500 mg via INTRAVENOUS
  Filled 2020-03-03: qty 500

## 2020-03-03 MED ORDER — SODIUM CHLORIDE 0.9% FLUSH
3.0000 mL | Freq: Once | INTRAVENOUS | Status: DC
  Filled 2020-03-03: qty 3

## 2020-03-03 MED ORDER — ACETAMINOPHEN 325 MG PO TABS
650.0000 mg | ORAL_TABLET | Freq: Once | ORAL | Status: AC
  Administered 2020-03-03: 650 mg via ORAL
  Filled 2020-03-03: qty 2

## 2020-03-03 NOTE — ED Provider Notes (Signed)
[place] DEPT [facility] Provider Note: Georgena Spurling, MD, FACEP  CSN: [id number] MRN: [id number] ARRIVAL: 03/03/20 at 2151 ROOM: MH05/MH05   CHIEF COMPLAINT  Fever   HISTORY OF PRESENT ILLNESS  03/03/20 10:56 PM [name] Pratt is a 79 y.o. male who started feeling hot and weak earlier this evening.  He became so weak he was unable to stand without assistance.  On arrival he was noted to have a temperature of 102.8 and he was given acetaminophen 650 mg.  He denies chills, cough, shortness of breath (other than his baseline dyspnea on exertion), vomiting, diarrhea or dysuria.  He does have nausea and was given Zofran ODT on arrival.  He had a similar episode of feeling hot several days ago but that did not progress to worse symptoms.  He was seen by his gastroenterologist earlier today (for hemorrhoids and anemia) but states he was not sick at that time.   Past Medical History:  Diagnosis Date  . Arthritis   . Cancer ([place])    skin cancer  . Carotid stenosis    a. Carotid U/S 2/50: LICA < 03%, RICA 70-48%;  b.  Carotid U/S 8/89:  RICA 16-94%; LICA 5-03%; f/u 1 year  . Chest pain     Myoview in 2008 was normal.  Echo 7/09: EF 60%, normal wall motion, mild LVH, mild LAE.  [name] Complication of anesthesia    "stomach does not wake up"  . COPD (chronic obstructive pulmonary disease) ([place])   . GERD (gastroesophageal reflux disease)   . Gout   . History of chicken pox   . History of hemorrhoids   . History of kidney stones   . Hyperlipidemia    under control  . Hypertension    under control  . Hypothyroidism   . Ileus ([place])   . Inguinal hernia   . OSA (obstructive sleep apnea)   . Pancreatitis   . PONV (postoperative nausea and vomiting)   . Rosacea   . 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    Past Surgical History:  Procedure Laterality Date  . ABDOMINAL HERNIA REPAIR  07/15/12   Dr [name]  . BROW LIFT  05/07/01  . CYSTOSCOPY  WITH URETEROSCOPY AND STENT PLACEMENT Right 01/28/2014   Procedure: CYSTOSCOPY WITH URETEROSCOPY, BASKET RETRIVAL AND  STENT PLACEMENT;  Surgeon: [name], MD;  Location: [facility];  Service: Urology;  Laterality: Right;  . epidural injections     multiple procedures  . [place]   multiple times  . EYE SURGERY Bilateral 03/25/10, 2012   cataract removal  . HEMORRHOID SURGERY  2014  . HIATAL HERNIA REPAIR  12/21/93  . HOLMIUM LASER APPLICATION Right 8/88/2800   Procedure: HOLMIUM LASER APPLICATION;  Surgeon: [name], MD;  Location: [facility];  Service: Urology;  Laterality: Right;  . INGUINAL HERNIA REPAIR Right 07/15/12   Dr [name], x2  . JOINT REPLACEMENT  07/25/04   right knee  . JOINT REPLACEMENT  07/13/10   left knee  . KNEE SURGERY  1995  . LAPAROSCOPIC CHOLECYSTECTOMY  09/20/06   with intraoperative cholangiogram and right inguinal herniorrhaphy with mesh   . LIGAMENT REPAIR Left 07/2013   shoulder  . LITHOTRIPSY     x2  . NASAL SEPTUM SURGERY  1975  . REFRACTIVE SURGERY Bilateral   . 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 SKIN CANCER DESTRUCTION  nose, ear  . TONSILLECTOMY  as child  . TOTAL HIP ARTHROPLASTY Left   . URETHRAL DILATION  1991   [name]    Family History  Problem Relation Age of Onset  . Cancer Mother   . Hypertension Other   . Cancer Other   . Osteoarthritis Other     Social History   Tobacco Use  . Smoking status: Former Smoker    Years: 11.00    Types: Cigarettes    Quit date: 09/11/1978    Years since quitting: 41.5  . Smokeless tobacco: Never Used  Vaping Use  . Vaping Use: Never used  Substance Use Topics  . Alcohol use: Yes    Alcohol/week: 0.0 standard drinks    Comment: rare  . Drug use: No    Prior to Admission medications   Medication Sig Start Date End Date Taking? Authorizing Provider  albuterol (PROVENTIL HFA;VENTOLIN HFA) 108 (90 Base) MCG/ACT inhaler Inhale 2 puffs into the lungs every 6  (six) hours as needed for shortness of breath. 12/03/15   [name], NP  allopurinol (ZYLOPRIM) 100 MG tablet Take 1 tablet (100 mg total) by mouth 2 (two) times daily. 11/21/19   [name], NP  amLODipine (NORVASC) 10 MG tablet TAKE 1 TABLET EVERY DAY 02/26/20   [name], NP  aspirin EC 81 MG tablet Take 81 mg by mouth every morning.    [provider]  Carboxymethylcellul-Glycerin (OPTIVE) 0.5-0.9 % SOLN Apply 1 drop to eye daily.    [provider]  Colchicine (MITIGARE) 0.6 MG CAPS Take 1 capsule by mouth daily. 09/25/19   [name], NP  Cranberry 500 MG TABS Take 1 tablet by mouth daily.    [provider]  cycloSPORINE (RESTASIS) 0.05 % ophthalmic emulsion Place 1 drop into both eyes 2 (two) times daily.    [provider]  diclofenac sodium (VOLTAREN) 1 % GEL Apply 1 application topically daily as needed (arthritis). 09/15/16   [name], NP  docusate sodium (COLACE) 100 MG capsule Take 1 capsule (100 mg total) by mouth 2 (two) times daily as needed for mild constipation. 03/04/19   [name], NP  fenofibrate (TRICOR) 145 MG tablet TAKE 1 TABLET EVERY DAY 10/27/19   [name], NP  fluticasone [facility]) 50 MCG/ACT nasal spray Place 2 sprays into both nostrils daily as needed for allergies or rhinitis. 03/04/19   [name], NP  gabapentin (NEURONTIN) 300 MG capsule Take 300 mg capsule 2 to 3 times a day as needed. 12/01/19   [name], NP  hydrALAZINE (APRESOLINE) 50 MG tablet TAKE 1 TABLET THREE TIMES DAILY 02/22/20   [name], NP  IRON PO Take 50 mcg by mouth daily.    [provider]  levothyroxine (SYNTHROID) 150 MCG tablet TAKE 1 TABLET ONE TIME DAILY ON 6 DAYS PER WEEK AND 1/2 TABLET ON 1 DAY PER WEEK 12/22/19   [name], NP  losartan-hydrochlorothiazide (HYZAAR) 100-25 MG tablet Take 1 tablet by mouth daily. 02/03/20   [name], MD    multivitamin [facility]) per tablet Take 1 tablet by mouth daily.      [provider]  MYRBETRIQ 25 MG TB24 tablet  03/26/19   [provider]  nitroGLYCERIN (NITROSTAT) 0.4 MG SL tablet Place 1 tablet (0.4 mg total) under the tongue every 5 (five) minutes as needed for chest pain. 03/11/18   [name], MD  Omega-3 Fatty Acids (FISH OIL PO) Take 1 capsule by  mouth 2 (two) times daily.    [provider]  rosuvastatin (CRESTOR) 5 MG tablet Take 1 tablet by mouth once daily 09/11/19   [name], NP  sodium chloride (OCEAN) 0.65 % nasal spray Place 1 spray into both nostrils at bedtime as needed for congestion.    [provider]  allopurinol (ZYLOPRIM) 100 MG tablet Take 1 tablet (100 mg total) by mouth daily. 11/05/18   [name], NP    Allergies Metoclopramide hcl, Pravastatin, and Sildenafil   REVIEW OF SYSTEMS  Negative except as noted here or in the History of Present Illness.   PHYSICAL EXAMINATION  Initial Vital Signs Blood pressure 124/65, pulse 72, temperature (!) 102.8 F (39.3 C), temperature source Oral, resp. rate 18, SpO2 97 %.  Examination General: Well-developed, well-nourished male in no acute distress; appearance consistent with age of record HENT: normocephalic; atraumatic Eyes: Pupils pinpoint; bilateral pseudophakia; extraocular muscles intact Neck: supple Heart: regular rate and rhythm; no murmur heard Lungs: clear to auscultation bilaterally Abdomen: soft; nondistended; nontender; no masses or hepatosplenomegaly; bowel sounds present Extremities: No deformity; full range of motion; pulses normal Neurologic: Awake, alert and oriented; motor function intact in all extremities and symmetric but generally weak; no facial droop Skin: Warm and dry Psychiatric: Normal mood and affect   RESULTS  Summary of this visit's results, reviewed and interpreted by myself:   EKG Interpretation  Date/Time:     Ventricular Rate:    PR Interval:    QRS Duration:   QT Interval:    QTC Calculation:   R Axis:     Text Interpretation:        Laboratory Studies: Results for orders placed or performed during the hospital encounter of 03/03/20 (from the past 24 hour(s))  Lactic acid, plasma     Status: None   Collection Time: 03/03/20 10:32 PM  Result Value Ref Range   Lactic Acid, Venous 0.9 0.5 - 1.9 mmol/L  Comprehensive metabolic panel     Status: Abnormal   Collection Time: 03/03/20 10:32 PM  Result Value Ref Range   Sodium 135 135 - 145 mmol/L   Potassium 3.3 (L) 3.5 - 5.1 mmol/L   Chloride 100 98 - 111 mmol/L   CO2 24 22 - 32 mmol/L   Glucose, Bld 156 (H) 70 - 99 mg/dL   BUN 39 (H) 8 - 23 mg/dL   Creatinine, Ser 1.98 (H) 0.61 - 1.24 mg/dL   Calcium 9.2 8.9 - 10.3 mg/dL   Total Protein 7.9 6.5 - 8.1 g/dL   Albumin 4.2 3.5 - 5.0 g/dL   AST 46 (H) 15 - 41 U/L   ALT 29 0 - 44 U/L   Alkaline Phosphatase 41 38 - 126 U/L   Total Bilirubin 0.5 0.3 - 1.2 mg/dL   GFR calc non Af Amer 31 (L) >60 mL/min   GFR calc Af Amer 36 (L) >60 mL/min   Anion gap 11 5 - 15  CBC with Differential     Status: Abnormal   Collection Time: 03/03/20 10:32 PM  Result Value Ref Range   WBC 6.4 4.0 - 10.5 K/uL   RBC 3.87 (L) 4.22 - 5.81 MIL/uL   Hemoglobin 12.4 (L) 13.0 - 17.0 g/dL   HCT 38.0 (L) 39 - 52 %   MCV 98.2 80.0 - 100.0 fL   MCH 32.0 26.0 - 34.0 pg   MCHC 32.6 30.0 - 36.0 g/dL   RDW 13.4 11.5 - 15.5 %   Platelets  351 150 - 400 K/uL   nRBC 0.0 0.0 - 0.2 %   Neutrophils Relative % 79 %   Neutro Abs 5.1 1.7 - 7.7 K/uL   Lymphocytes Relative 12 %   Lymphs Abs 0.8 0.7 - 4.0 K/uL   Monocytes Relative 8 %   Monocytes Absolute 0.5 0 - 1 K/uL   Eosinophils Relative 0 %   Eosinophils Absolute 0.0 0 - 0 K/uL   Basophils Relative 1 %   Basophils Absolute 0.0 0 - 0 K/uL   Immature Granulocytes 0 %   Abs Immature Granulocytes 0.02 0.00 - 0.07 K/uL  Lactic acid, plasma     Status: None   Collection  Time: 03/04/20  1:36 AM  Result Value Ref Range   Lactic Acid, Venous 0.7 0.5 - 1.9 mmol/L  Urinalysis, Routine w reflex microscopic     Status: None   Collection Time: 03/04/20  1:36 AM  Result Value Ref Range   Color, Urine YELLOW YELLOW   APPearance CLEAR CLEAR   Specific Gravity, Urine 1.015 1.005 - 1.030   pH 5.5 5.0 - 8.0   Glucose, UA NEGATIVE NEGATIVE mg/dL   Hgb urine dipstick NEGATIVE NEGATIVE   Bilirubin Urine NEGATIVE NEGATIVE   Ketones, ur NEGATIVE NEGATIVE mg/dL   Protein, ur NEGATIVE NEGATIVE mg/dL   Nitrite NEGATIVE NEGATIVE   Leukocytes,Ua NEGATIVE NEGATIVE  SARS Coronavirus 2 by RT PCR (hospital order, performed in [place] hospital lab) Nasopharyngeal Nasopharyngeal Swab     Status: None   Collection Time: 03/04/20  2:52 AM   Specimen: Nasopharyngeal Swab  Result Value Ref Range   SARS Coronavirus 2 NEGATIVE NEGATIVE   Imaging Studies: DG Chest 2 View  Result Date: 03/03/2020 CLINICAL DATA:  Fever and nausea EXAM: CHEST - 2 VIEW COMPARISON:  01/16/2019, chest CT 05/22/2016 FINDINGS: Possible ground-glass infiltrates in the lower lobes. No pleural effusion. Normal heart size. No pneumothorax. Possible round nodule projecting posteriorly over the spine. IMPRESSION: 1. Possible subtle ground-glass infiltrates/mild pneumonia in the lower lobes 2. Round opacity posteriorly projecting over the spine on lateral view indeterminate for pulmonary nodule or possibly related to the skeleton. Consider chest CT for further evaluation. Electronically Signed   By: Donavan Foil M.D.   On: 03/03/2020 22:38   CT Chest Wo Contrast  Result Date: 03/03/2020 CLINICAL DATA:  Fever and nausea, abnormal chest radiograph EXAM: CT CHEST WITHOUT CONTRAST TECHNIQUE: Multidetector CT imaging of the chest was performed following the standard protocol without IV contrast. COMPARISON:  Chest CTA 05/22/2016, radiograph 03/03/2020 FINDINGS: Cardiovascular: Cardiac size within normal limits.  Three-vessel coronary artery disease is noted. No pericardial effusion. Calcifications present upon the mitral annulus and aortic leaflets. Atherosclerotic plaque within the normal caliber aorta. Shared origin of the brachiocephalic and left common carotid arteries. Atheromatous plaque in the proximal great vessels. Central pulmonary arteries are normal caliber. Luminal evaluation of the vasculature precluded in the absence of contrast media. Mediastinum/Nodes: No mediastinal fluid or gas. Diminutive appearance of the thyroid without concerning nodule. No acute abnormality of the trachea. Stable appearance of a sliding-type hiatal hernia. No worrisome mediastinal or axillary adenopathy. Hilar nodal evaluation is limited in the absence of intravenous contrast media. Lungs/Pleura: Stable region of pleuroparenchymal scarring in the left lung apex. Diffuse centrilobular and paraseptal emphysema with apical to basilar gradient. Mild bronchitic features with diffuse airways thickening and some scattered secretions. Few bandlike areas of atelectasis are present in the lung base in may correspond to the abnormality seen on  comparison radiography. No consolidation, features of edema, pneumothorax, or effusion. Few stable sub 3 mm likely inflammatory nodules are present in the right upper lobe/apex (3/41, 37, 30). No new or concerning pulmonary nodules or masses. Upper Abdomen: Post cholecystectomy. Subcentimeter hypodense foci in the dome of the right liver, too small to fully characterize on CT imaging but statistically likely benign. Stable mild bilateral perinephric stranding. Fluid attenuation cyst in the upper pole right kidney, present on priors. No acute abnormalities present in the visualized portions of the upper abdomen. Musculoskeletal: Multilevel degenerative changes are present in the imaged portions of the spine. Multilevel flowing anterior osteophytosis, compatible with features of diffuse idiopathic skeletal  hyperostosis (DISH). Additional degenerative changes in the shoulders with postsurgical change of the left humerus likely from prior rotator cuff repair. Mild bilateral gynecomastia. IMPRESSION: 1. Bandlike areas of atelectasis in the left lung base may correspond to the abnormality seen on comparison radiography. 2. No other acute intrathoracic process. Specifically, no evidence of pneumonia. 3. Few stable, likely postinflammatory, sub 3 mm nodules in the right upper lobe. 4. Unchanged sliding-type hiatal hernia. 5. Aortic Atherosclerosis (ICD10-I70.0) 6. Emphysema (ICD10-J43.9) and chronic bronchitic features. May correlate with smoking history. Electronically Signed   By: Lovena Le M.D.   On: 03/03/2020 23:52    ED COURSE and MDM  Nursing notes, initial and subsequent vitals signs, including pulse oximetry, reviewed and interpreted by myself.  Vitals:   03/04/20 0100 03/04/20 0130 03/04/20 0255 03/04/20 0300  BP: (!) 125/55 134/62 136/64 132/63  Pulse: (!) 58 72 62 (!) 54  Resp: 18 16 19 15   Temp:      TempSrc:      SpO2: (!) 87% 97% 97% 95%   Medications  sodium chloride flush (NS) 0.9 % injection 3 mL (has no administration in time range)  acetaminophen (TYLENOL) tablet 650 mg (650 mg Oral Given 03/03/20 2225)  ondansetron (ZOFRAN-ODT) disintegrating tablet 4 mg (4 mg Oral Given 03/03/20 2226)  cefTRIAXone (ROCEPHIN) 1 g in sodium chloride 0.9 % 100 mL IVPB (0 g Intravenous Stopped 03/04/20 0007)  azithromycin (ZITHROMAX) 500 mg in sodium chloride 0.9 % 250 mL IVPB (0 mg Intravenous Stopped 03/04/20 0131)  potassium chloride SA (KLOR-CON) CR tablet 40 mEq (40 mEq Oral Given 03/04/20 0251)   11:10 PM Rocephin and Zithromax started for suspected pneumonia.  Will obtain CT of the chest to further evaluate equivocal finding seen on plain films.  1:59 AM Pneumonia was not confirmed on CT scan.  The source of his fever is unclear and could be a viral illness.  As the patient is too weak to  even stand without assistance we will have him admitted.  PROCEDURES  Procedures   ED DIAGNOSES     ICD-10-CM   1. Febrile illness  R50.9   2. Generalized weakness  R53.1   3. Nausea  R11.0   4. Hypokalemia  E87.6   5. 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

## 2020-03-03 NOTE — ED Triage Notes (Addendum)
Pt c/o fever, nausea x today-states he was seen by GI today and "wasn't sick then"-denies pain-NAD-to triage in w/c

## 2020-03-04 ENCOUNTER — Encounter (HOSPITAL_COMMUNITY): Payer: Self-pay | Admitting: Internal Medicine

## 2020-03-04 DIAGNOSIS — G4733 Obstructive sleep apnea (adult) (pediatric): Secondary | ICD-10-CM | POA: Diagnosis not present

## 2020-03-04 DIAGNOSIS — E785 Hyperlipidemia, unspecified: Secondary | ICD-10-CM

## 2020-03-04 DIAGNOSIS — I129 Hypertensive chronic kidney disease with stage 1 through stage 4 chronic kidney disease, or unspecified chronic kidney disease: Secondary | ICD-10-CM

## 2020-03-04 DIAGNOSIS — Z79899 Other long term (current) drug therapy: Secondary | ICD-10-CM | POA: Diagnosis not present

## 2020-03-04 DIAGNOSIS — R6510 Systemic inflammatory response syndrome (SIRS) of non-infectious origin without acute organ dysfunction: Secondary | ICD-10-CM | POA: Diagnosis not present

## 2020-03-04 DIAGNOSIS — Z8719 Personal history of other diseases of the digestive system: Secondary | ICD-10-CM | POA: Diagnosis not present

## 2020-03-04 DIAGNOSIS — D649 Anemia, unspecified: Secondary | ICD-10-CM | POA: Diagnosis not present

## 2020-03-04 DIAGNOSIS — M1990 Unspecified osteoarthritis, unspecified site: Secondary | ICD-10-CM | POA: Insufficient documentation

## 2020-03-04 DIAGNOSIS — I160 Hypertensive urgency: Secondary | ICD-10-CM | POA: Diagnosis not present

## 2020-03-04 DIAGNOSIS — M109 Gout, unspecified: Secondary | ICD-10-CM | POA: Diagnosis not present

## 2020-03-04 DIAGNOSIS — R531 Weakness: Secondary | ICD-10-CM | POA: Diagnosis not present

## 2020-03-04 DIAGNOSIS — I700 Atherosclerosis of aorta: Secondary | ICD-10-CM | POA: Diagnosis not present

## 2020-03-04 DIAGNOSIS — Z7982 Long term (current) use of aspirin: Secondary | ICD-10-CM | POA: Diagnosis not present

## 2020-03-04 DIAGNOSIS — K449 Diaphragmatic hernia without obstruction or gangrene: Secondary | ICD-10-CM | POA: Diagnosis not present

## 2020-03-04 DIAGNOSIS — J439 Emphysema, unspecified: Secondary | ICD-10-CM | POA: Diagnosis not present

## 2020-03-04 DIAGNOSIS — E876 Hypokalemia: Secondary | ICD-10-CM | POA: Diagnosis not present

## 2020-03-04 DIAGNOSIS — J9811 Atelectasis: Secondary | ICD-10-CM | POA: Diagnosis not present

## 2020-03-04 DIAGNOSIS — R509 Fever, unspecified: Secondary | ICD-10-CM | POA: Diagnosis not present

## 2020-03-04 DIAGNOSIS — Z85828 Personal history of other malignant neoplasm of skin: Secondary | ICD-10-CM | POA: Diagnosis not present

## 2020-03-04 DIAGNOSIS — I5032 Chronic diastolic (congestive) heart failure: Secondary | ICD-10-CM | POA: Diagnosis not present

## 2020-03-04 DIAGNOSIS — I130 Hypertensive heart and chronic kidney disease with heart failure and stage 1 through stage 4 chronic kidney disease, or unspecified chronic kidney disease: Secondary | ICD-10-CM | POA: Diagnosis not present

## 2020-03-04 DIAGNOSIS — N1832 Chronic kidney disease, stage 3b: Secondary | ICD-10-CM | POA: Diagnosis not present

## 2020-03-04 DIAGNOSIS — Z20822 Contact with and (suspected) exposure to covid-19: Secondary | ICD-10-CM | POA: Diagnosis not present

## 2020-03-04 DIAGNOSIS — E039 Hypothyroidism, unspecified: Secondary | ICD-10-CM | POA: Diagnosis not present

## 2020-03-04 DIAGNOSIS — M4810 Ankylosing hyperostosis [Forestier], site unspecified: Secondary | ICD-10-CM | POA: Diagnosis not present

## 2020-03-04 DIAGNOSIS — Z87891 Personal history of nicotine dependence: Secondary | ICD-10-CM | POA: Diagnosis not present

## 2020-03-04 LAB — CBC
HCT: 37.6 % — ABNORMAL LOW (ref 39.0–52.0)
Hemoglobin: 12.3 g/dL — ABNORMAL LOW (ref 13.0–17.0)
MCH: 32.5 pg (ref 26.0–34.0)
MCHC: 32.7 g/dL (ref 30.0–36.0)
MCV: 99.5 fL (ref 80.0–100.0)
Platelets: 277 10*3/uL (ref 150–400)
RBC: 3.78 MIL/uL — ABNORMAL LOW (ref 4.22–5.81)
RDW: 13.3 % (ref 11.5–15.5)
WBC: 4.8 10*3/uL (ref 4.0–10.5)
nRBC: 0 % (ref 0.0–0.2)

## 2020-03-04 LAB — SARS CORONAVIRUS 2 BY RT PCR (HOSPITAL ORDER, PERFORMED IN ~~LOC~~ HOSPITAL LAB): SARS Coronavirus 2: NEGATIVE

## 2020-03-04 LAB — BASIC METABOLIC PANEL
Anion gap: 11 (ref 5–15)
BUN: 32 mg/dL — ABNORMAL HIGH (ref 8–23)
CO2: 25 mmol/L (ref 22–32)
Calcium: 9.1 mg/dL (ref 8.9–10.3)
Chloride: 101 mmol/L (ref 98–111)
Creatinine, Ser: 1.61 mg/dL — ABNORMAL HIGH (ref 0.61–1.24)
GFR calc Af Amer: 46 mL/min — ABNORMAL LOW (ref >60)
GFR calc non Af Amer: 40 mL/min — ABNORMAL LOW (ref >60)
Glucose, Bld: 104 mg/dL — ABNORMAL HIGH (ref 70–99)
Potassium: 3.6 mmol/L (ref 3.5–5.1)
Sodium: 137 mmol/L (ref 135–145)

## 2020-03-04 LAB — URINALYSIS, ROUTINE W REFLEX MICROSCOPIC
Bilirubin Urine: NEGATIVE
Glucose, UA: NEGATIVE mg/dL
Hgb urine dipstick: NEGATIVE
Ketones, ur: NEGATIVE mg/dL
Leukocytes,Ua: NEGATIVE
Nitrite: NEGATIVE
Protein, ur: NEGATIVE mg/dL
Specific Gravity, Urine: 1.015 (ref 1.005–1.030)
pH: 5.5 (ref 5.0–8.0)

## 2020-03-04 LAB — LACTIC ACID, PLASMA: Lactic Acid, Venous: 0.7 mmol/L (ref 0.5–1.9)

## 2020-03-04 LAB — VITAMIN B12: Vitamin B-12: 605 pg/mL (ref 180–914)

## 2020-03-04 MED ORDER — LEVOTHYROXINE SODIUM 75 MCG PO TABS
150.0000 ug | ORAL_TABLET | ORAL | Status: DC
  Administered 2020-03-05: 150 ug via ORAL
  Filled 2020-03-04: qty 2

## 2020-03-04 MED ORDER — COLCHICINE 0.6 MG PO TABS
0.6000 mg | ORAL_TABLET | Freq: Every day | ORAL | Status: DC
  Filled 2020-03-04 (×2): qty 1

## 2020-03-04 MED ORDER — ASPIRIN EC 81 MG PO TBEC
81.0000 mg | DELAYED_RELEASE_TABLET | Freq: Every morning | ORAL | Status: DC
  Administered 2020-03-05: 81 mg via ORAL
  Filled 2020-03-04: qty 1

## 2020-03-04 MED ORDER — GABAPENTIN 100 MG PO CAPS
100.0000 mg | ORAL_CAPSULE | Freq: Three times a day (TID) | ORAL | Status: DC
  Administered 2020-03-04: 100 mg via ORAL
  Filled 2020-03-04 (×2): qty 1

## 2020-03-04 MED ORDER — POTASSIUM CHLORIDE CRYS ER 20 MEQ PO TBCR
40.0000 meq | EXTENDED_RELEASE_TABLET | Freq: Once | ORAL | Status: AC
  Administered 2020-03-04: 40 meq via ORAL
  Filled 2020-03-04: qty 2

## 2020-03-04 MED ORDER — FENOFIBRATE 160 MG PO TABS
160.0000 mg | ORAL_TABLET | Freq: Every day | ORAL | Status: DC
  Administered 2020-03-05: 160 mg via ORAL
  Filled 2020-03-04 (×2): qty 1

## 2020-03-04 MED ORDER — ALBUTEROL SULFATE (2.5 MG/3ML) 0.083% IN NEBU: 2.5000 mg | INHALATION_SOLUTION | Freq: Four times a day (QID) | RESPIRATORY_TRACT | Status: DC | PRN

## 2020-03-04 MED ORDER — LACTATED RINGERS IV SOLN: INTRAVENOUS | Status: AC

## 2020-03-04 MED ORDER — AMLODIPINE BESYLATE 10 MG PO TABS
10.0000 mg | ORAL_TABLET | Freq: Every day | ORAL | Status: DC
  Administered 2020-03-04 – 2020-03-05 (×2): 10 mg via ORAL
  Filled 2020-03-04 (×3): qty 1

## 2020-03-04 MED ORDER — NITROGLYCERIN 0.4 MG SL SUBL: 0.4000 mg | SUBLINGUAL_TABLET | SUBLINGUAL | Status: DC | PRN

## 2020-03-04 MED ORDER — FLUTICASONE PROPIONATE 50 MCG/ACT NA SUSP
2.0000 | Freq: Every day | NASAL | Status: DC | PRN
  Filled 2020-03-04: qty 16

## 2020-03-04 MED ORDER — ACETAMINOPHEN 650 MG RE SUPP: 650.0000 mg | Freq: Four times a day (QID) | RECTAL | Status: DC | PRN

## 2020-03-04 MED ORDER — LEVOTHYROXINE SODIUM 75 MCG PO TABS: 75.0000 ug | ORAL_TABLET | ORAL | Status: DC

## 2020-03-04 MED ORDER — ACETAMINOPHEN 325 MG PO TABS: 650.0000 mg | ORAL_TABLET | Freq: Four times a day (QID) | ORAL | Status: DC | PRN

## 2020-03-04 MED ORDER — ALLOPURINOL 100 MG PO TABS
100.0000 mg | ORAL_TABLET | Freq: Two times a day (BID) | ORAL | Status: DC
  Administered 2020-03-05: 100 mg via ORAL
  Filled 2020-03-04 (×2): qty 1

## 2020-03-04 MED ORDER — HYDRALAZINE HCL 50 MG PO TABS
50.0000 mg | ORAL_TABLET | Freq: Three times a day (TID) | ORAL | Status: DC
  Administered 2020-03-04 – 2020-03-05 (×2): 50 mg via ORAL
  Filled 2020-03-04 (×2): qty 1

## 2020-03-04 MED ORDER — DOCUSATE SODIUM 100 MG PO CAPS: 100.0000 mg | ORAL_CAPSULE | Freq: Two times a day (BID) | ORAL | Status: DC | PRN

## 2020-03-04 MED ORDER — ROSUVASTATIN CALCIUM 5 MG PO TABS
5.0000 mg | ORAL_TABLET | Freq: Every day | ORAL | Status: DC
  Administered 2020-03-04 – 2020-03-05 (×2): 5 mg via ORAL
  Filled 2020-03-04 (×2): qty 1

## 2020-03-04 NOTE — ED Notes (Signed)
Pt wife contact: Pamala Hurry [phone]

## 2020-03-04 NOTE — Progress Notes (Signed)
Pt arrived to unit via stretcher with [facility]. Ambulated to bed, gait steady A&O x4  no complaints voiced. Denies any pain. Oriented to room call light in reach.

## 2020-03-04 NOTE — ED Notes (Signed)
Pt encouraged to void

## 2020-03-04 NOTE — H&P (Signed)
History and Physical        Hospital Admission Note Date: 03/04/2020  Patient name: [name] Pratt Medical record number: [id number] Date of birth: July 22, 1941 Age: 79 y.o. Gender: male  PCP: [name], NP  Patient coming from: [facility] (home prior) Lives with: Wife At baseline, ambulates: Independently  Chief Complaint    Chief Complaint  Patient presents with  . Fever      HPI:   This is an 79 year old male with past medical history of gout, anemia, hypertension, hypothyroidism, bilateral carotid artery stenosis, COPD, CKD 3b, HFpEF who presented on 6/23 to [facility] ED with complaints of progressively worsening generalized weakness x1 month and new onset fever to 33 F early on the day of admission.  He was seen by his gastroenterologist at [place] for hemorrhoids and anemia early on 6/23 without any noted issues at that time.  No sick contacts, no recent travel.  No rashes.  Currently feels improved.  Reports progressively worsening weakness most notable over the past month but likely decline over the past several months.  Admits to lower extremity weakness bilaterally with some paresthesias over his feet.  [facility] ED course: T102.8, given Tylenol 650 mg x 1.  CXR concerning for possible subtle groundglass infiltrate/mild pneumonia in the lower lobes and found posterior opacity projecting over the spine on lateral view.  He was given a dose of ceftriaxone and azithromycin for possible CAP.  CT chest no contrast with bandlike areas of atelectasis in left lung base corresponding to CXR findings, multilevel anterior osteophytosis compatible with DISH, few postinflammatory sub-3 mm nodules in right upper lobe.  He was transferred to [facility] for further evaluation of SIRS and generalized weakness.  Notable ED labs: K3.3, creatinine 1.98 (baseline 1.6-1.8), WBC 6.4, Hb 12.4, UA  negative, COVID-19 negative  ED vitals:  Vitals:   03/04/20 1500 03/04/20 1702  BP: 139/60 (!) 173/69  Pulse: (!) 47 (!) 57  Resp: 14 20  Temp: 97.9 F (36.6 C) 98.1 F (36.7 C)  SpO2: 96% 98%     Review of Systems:  Review of Systems  Constitutional: Positive for fever. Negative for chills.  Eyes: Negative.   Cardiovascular: Negative for chest pain and palpitations.  Gastrointestinal: Positive for nausea. Negative for vomiting.  Genitourinary: Negative.  Negative for dysuria and urgency.  Musculoskeletal: Positive for falls. Negative for myalgias and neck pain.       Falls but not recently Cramping in calves at night   Skin: Negative.  Negative for rash.  Neurological: Positive for weakness. Negative for focal weakness.       Bilateral paresthesias in feet  All other systems reviewed and are negative.   Medical/Social/Family History   Past Medical History: Past Medical History:  Diagnosis Date  . Arthritis   . Cancer ([place])    skin cancer  . Carotid stenosis    a. Carotid U/S 5/91: LICA < 63%, RICA 84-66%;  b.  Carotid U/S 5/99:  RICA 35-70%; LICA 1-77%; f/u 1 year  . Chest pain     Myoview in 2008 was normal.  Echo 7/09: EF 60%, normal wall motion, mild LVH, mild LAE.  [name] Complication of anesthesia    "stomach  does not wake up"  . COPD (chronic obstructive pulmonary disease) ([place])   . GERD (gastroesophageal reflux disease)   . Gout   . History of chicken pox   . History of hemorrhoids   . History of kidney stones   . Hyperlipidemia    under control  . Hypertension    under control  . Hypothyroidism   . Ileus ([place])   . Inguinal hernia   . OSA (obstructive sleep apnea)   . Pancreatitis   . PONV (postoperative nausea and vomiting)   . Rosacea   . 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    Past Surgical History:  Procedure Laterality Date  . ABDOMINAL HERNIA REPAIR  07/15/12   Dr [name]  . BROW LIFT  05/07/01    . CYSTOSCOPY WITH URETEROSCOPY AND STENT PLACEMENT Right 01/28/2014   Procedure: CYSTOSCOPY WITH URETEROSCOPY, BASKET RETRIVAL AND  STENT PLACEMENT;  Surgeon: [name], MD;  Location: [facility];  Service: Urology;  Laterality: Right;  . epidural injections     multiple procedures  . [place]   multiple times  . EYE SURGERY Bilateral 03/25/10, 2012   cataract removal  . HEMORRHOID SURGERY  2014  . HIATAL HERNIA REPAIR  12/21/93  . HOLMIUM LASER APPLICATION Right 1/94/1740   Procedure: HOLMIUM LASER APPLICATION;  Surgeon: [name], MD;  Location: [facility];  Service: Urology;  Laterality: Right;  . INGUINAL HERNIA REPAIR Right 07/15/12   Dr [name], x2  . JOINT REPLACEMENT  07/25/04   right knee  . JOINT REPLACEMENT  07/13/10   left knee  . KNEE SURGERY  1995  . LAPAROSCOPIC CHOLECYSTECTOMY  09/20/06   with intraoperative cholangiogram and right inguinal herniorrhaphy with mesh   . LIGAMENT REPAIR Left 07/2013   shoulder  . LITHOTRIPSY     x2  . NASAL SEPTUM SURGERY  1975  . REFRACTIVE SURGERY Bilateral   . 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 SKIN CANCER DESTRUCTION     nose, ear  . TONSILLECTOMY  as child  . TOTAL HIP ARTHROPLASTY Left   . URETHRAL DILATION  1991   [name]    Medications: Prior to Admission medications   Medication Sig Start Date End Date Taking? Authorizing Provider  albuterol (PROVENTIL HFA;VENTOLIN HFA) 108 (90 Base) MCG/ACT inhaler Inhale 2 puffs into the lungs every 6 (six) hours as needed for shortness of breath. 12/03/15   [name], NP  allopurinol (ZYLOPRIM) 100 MG tablet Take 1 tablet (100 mg total) by mouth 2 (two) times daily. 11/21/19   [name], NP  amLODipine (NORVASC) 10 MG tablet TAKE 1 TABLET EVERY DAY 02/26/20   [name], NP  aspirin EC 81 MG tablet Take 81 mg by mouth every morning.    [provider]  Carboxymethylcellul-Glycerin (OPTIVE) 0.5-0.9 % SOLN Apply 1 drop to  eye daily.    [provider]  Colchicine (MITIGARE) 0.6 MG CAPS Take 1 capsule by mouth daily. 09/25/19   [name], NP  Cranberry 500 MG TABS Take 1 tablet by mouth daily.    [provider]  cycloSPORINE (RESTASIS) 0.05 % ophthalmic emulsion Place 1 drop into both eyes 2 (two) times daily.    [provider]  diclofenac sodium (VOLTAREN) 1 % GEL Apply 1 application topically daily as needed (arthritis). 09/15/16   [name], NP  docusate sodium (COLACE) 100 MG capsule  Take 1 capsule (100 mg total) by mouth 2 (two) times daily as needed for mild constipation. 03/04/19   [name], NP  fenofibrate (TRICOR) 145 MG tablet TAKE 1 TABLET EVERY DAY 10/27/19   [name], NP  fluticasone [facility]) 50 MCG/ACT nasal spray Place 2 sprays into both nostrils daily as needed for allergies or rhinitis. 03/04/19   [name], NP  gabapentin (NEURONTIN) 300 MG capsule Take 300 mg capsule 2 to 3 times a day as needed. 12/01/19   [name], NP  hydrALAZINE (APRESOLINE) 50 MG tablet TAKE 1 TABLET THREE TIMES DAILY 02/22/20   [name], NP  IRON PO Take 50 mcg by mouth daily.    [provider]  levothyroxine (SYNTHROID) 150 MCG tablet TAKE 1 TABLET ONE TIME DAILY ON 6 DAYS PER WEEK AND 1/2 TABLET ON 1 DAY PER WEEK 12/22/19   [name], NP  losartan-hydrochlorothiazide (HYZAAR) 100-25 MG tablet Take 1 tablet by mouth daily. 02/03/20   [name], MD  multivitamin [facility]) per tablet Take 1 tablet by mouth daily.      [provider]  MYRBETRIQ 25 MG TB24 tablet  03/26/19   [provider]  nitroGLYCERIN (NITROSTAT) 0.4 MG SL tablet Place 1 tablet (0.4 mg total) under the tongue every 5 (five) minutes as needed for chest pain. 03/11/18   [name], MD  Omega-3 Fatty Acids (FISH OIL PO) Take 1 capsule by mouth 2 (two) times daily.    [provider]  rosuvastatin (CRESTOR) 5 MG  tablet Take 1 tablet by mouth once daily 09/11/19   [name], NP  sodium chloride (OCEAN) 0.65 % nasal spray Place 1 spray into both nostrils at bedtime as needed for congestion.    [provider]  allopurinol (ZYLOPRIM) 100 MG tablet Take 1 tablet (100 mg total) by mouth daily. 11/05/18   [name], NP    Allergies:   Allergies  Allergen Reactions  . Metoclopramide Hcl     anxiety  . Pravastatin     Muscle cramps  . Sildenafil     Tachycardia      Social History:  reports that he quit smoking about 41 years ago. His smoking use included cigarettes. He quit after 11.00 years of use. He has never used smokeless tobacco. He reports current alcohol use. He reports that he does not use drugs.  Family History: Family History  Problem Relation Age of Onset  . Cancer Mother   . Hypertension Other   . Cancer Other   . Osteoarthritis Other      Objective   Physical Exam: Blood pressure (!) 173/69, pulse (!) 57, temperature 98.1 F (36.7 C), temperature source Oral, resp. rate 20, SpO2 98 %.  Physical Exam Vitals and nursing note reviewed.  Constitutional:      Appearance: Normal appearance.  HENT:     Head: Normocephalic and atraumatic.  Eyes:     Conjunctiva/sclera: Conjunctivae normal.  Cardiovascular:     Rate and Rhythm: Normal rate and regular rhythm.  Pulmonary:     Effort: Pulmonary effort is normal.     Breath sounds: Normal breath sounds.  Abdominal:     General: Abdomen is flat.     Palpations: Abdomen is soft.  Musculoskeletal:        General: No swelling or tenderness.     Comments: No sign of infection of bilateral feet  Skin:    Coloration: Skin is not jaundiced or pale.  Neurological:  Mental Status: He is alert. Mental status is at baseline.  Psychiatric:        Mood and Affect: Mood normal.        Behavior: Behavior normal.     LABS on Admission: I have personally reviewed all the labs and imaging below     Basic Metabolic Panel: Recent Labs  Lab 03/03/20 2232  NA 135  K 3.3*  CL 100  CO2 24  GLUCOSE 156*  BUN 39*  CREATININE 1.98*  CALCIUM 9.2   Liver Function Tests: Recent Labs  Lab 03/03/20 2232  AST 46*  ALT 29  ALKPHOS 41  BILITOT 0.5  PROT 7.9  ALBUMIN 4.2   No results for input(s): LIPASE, AMYLASE in the last 168 hours. No results for input(s): AMMONIA in the last 168 hours. CBC: Recent Labs  Lab 03/03/20 2232  WBC 6.4  NEUTROABS 5.1  HGB 12.4*  HCT 38.0*  MCV 98.2  PLT 351   Cardiac Enzymes: No results for input(s): CKTOTAL, CKMB, CKMBINDEX, TROPONINI in the last 168 hours. BNP: Invalid input(s): POCBNP CBG: No results for input(s): GLUCAP in the last 168 hours.  Radiological Exams on Admission:  DG Chest 2 View  Result Date: 03/03/2020 CLINICAL DATA:  Fever and nausea EXAM: CHEST - 2 VIEW COMPARISON:  01/16/2019, chest CT 05/22/2016 FINDINGS: Possible ground-glass infiltrates in the lower lobes. No pleural effusion. Normal heart size. No pneumothorax. Possible round nodule projecting posteriorly over the spine. IMPRESSION: 1. Possible subtle ground-glass infiltrates/mild pneumonia in the lower lobes 2. Round opacity posteriorly projecting over the spine on lateral view indeterminate for pulmonary nodule or possibly related to the skeleton. Consider chest CT for further evaluation. Electronically Signed   By: Donavan Foil M.D.   On: 03/03/2020 22:38   CT Chest Wo Contrast  Result Date: 03/03/2020 CLINICAL DATA:  Fever and nausea, abnormal chest radiograph EXAM: CT CHEST WITHOUT CONTRAST TECHNIQUE: Multidetector CT imaging of the chest was performed following the standard protocol without IV contrast. COMPARISON:  Chest CTA 05/22/2016, radiograph 03/03/2020 FINDINGS: Cardiovascular: Cardiac size within normal limits. Three-vessel coronary artery disease is noted. No pericardial effusion. Calcifications present upon the mitral annulus and aortic leaflets.  Atherosclerotic plaque within the normal caliber aorta. Shared origin of the brachiocephalic and left common carotid arteries. Atheromatous plaque in the proximal great vessels. Central pulmonary arteries are normal caliber. Luminal evaluation of the vasculature precluded in the absence of contrast media. Mediastinum/Nodes: No mediastinal fluid or gas. Diminutive appearance of the thyroid without concerning nodule. No acute abnormality of the trachea. Stable appearance of a sliding-type hiatal hernia. No worrisome mediastinal or axillary adenopathy. Hilar nodal evaluation is limited in the absence of intravenous contrast media. Lungs/Pleura: Stable region of pleuroparenchymal scarring in the left lung apex. Diffuse centrilobular and paraseptal emphysema with apical to basilar gradient. Mild bronchitic features with diffuse airways thickening and some scattered secretions. Few bandlike areas of atelectasis are present in the lung base in may correspond to the abnormality seen on comparison radiography. No consolidation, features of edema, pneumothorax, or effusion. Few stable sub 3 mm likely inflammatory nodules are present in the right upper lobe/apex (3/41, 37, 30). No new or concerning pulmonary nodules or masses. Upper Abdomen: Post cholecystectomy. Subcentimeter hypodense foci in the dome of the right liver, too small to fully characterize on CT imaging but statistically likely benign. Stable mild bilateral perinephric stranding. Fluid attenuation cyst in the upper pole right kidney, present on priors. No acute abnormalities present in the  visualized portions of the upper abdomen. Musculoskeletal: Multilevel degenerative changes are present in the imaged portions of the spine. Multilevel flowing anterior osteophytosis, compatible with features of diffuse idiopathic skeletal hyperostosis (DISH). Additional degenerative changes in the shoulders with postsurgical change of the left humerus likely from prior rotator  cuff repair. Mild bilateral gynecomastia. IMPRESSION: 1. Bandlike areas of atelectasis in the left lung base may correspond to the abnormality seen on comparison radiography. 2. No other acute intrathoracic process. Specifically, no evidence of pneumonia. 3. Few stable, likely postinflammatory, sub 3 mm nodules in the right upper lobe. 4. Unchanged sliding-type hiatal hernia. 5. Aortic Atherosclerosis (ICD10-I70.0) 6. Emphysema (ICD10-J43.9) and chronic bronchitic features. May correlate with smoking history. Electronically Signed   By: Lovena Le M.D.   On: 03/03/2020 23:52      EKG: Not done in the ED   A & P   Active Problems:   SIRS (systemic inflammatory response syndrome) (HCC)   1. SIRS unknown source, suspect viral a. T 102.8 in ED, Resolved with Tylenol x24 hours ago without recurrence of fever, without leukocytosis b. Received ceftriaxone/azithromycin in ED x1 c. UA negative d. CT chest without concerning findings for pneumonia initially seen on CXR.  More likely atelectasis which could be related to fever no shortness of breath, cough or other pulmonary issues e. Follow-up blood cultures f. Hold antibiotics for now and trend fever curve g. Gentle IV fluids for hydration  2. Generalized weakness, unknown etiology a. Ddx: Vitamin deficiency vs. Med induced (gabapentin and renal insufficiency, Myrbetriq, antihypertensives, orthostatic hypotension, other) b. TSH recently checked and unremarkable c. HA1C 5.6 in January and has never been significantly elevated d. Ferritin elevated at 491 in March indicating unlikely iron deficient e. With bilateral paresthesias of lower extremities will check vitamin B12 f. Check magnesium g. Orthostatics h. Hold Myrbetriq i. Renally dosed gabapentin j. Hold home losartan-HCTZ k. PT eval  3. Hypertensive urgency  a. Restart home hydralazine 50 mg 3 times daily and amlodipine 10 mg daily b. Hold losartan-HCTZ  4. CKD 3b a. Creatinine  slightly above baseline b. Hold losartan-HCTZ c. Gentle LR d. Renally dosed gabapentin  5. Hyperlipidemia a. Continue statin and fenofibrate  6. Compensated HFpEF a. EF 55 to 60% in 2017 with moderately calcified aortic valve b. If no etiology for generalized weakness can be found will obtain an echo to follow-up on aortic valve but no murmur on exam  7. DISH of spine a. Follow up outpatient   DVT prophylaxis: SCDs   Code Status: Prior  Diet: Heart healthy Family Communication: Admission, patients condition and plan of care including tests being ordered have been discussed with the patient who indicates understanding and agrees with the plan and Code Status. Patient's wife was updated  Disposition Plan: The appropriate patient status for this patient is OBSERVATION. Observation status is judged to be reasonable and necessary in order to provide the required intensity of service to ensure the patient's safety. The patient's presenting symptoms, physical exam findings, and initial radiographic and laboratory data in the context of their medical condition is felt to place them at decreased risk for further clinical deterioration. Furthermore, it is anticipated that the patient will be medically stable for discharge from the hospital within 2 midnights of admission. The following factors support the patient status of observation.   " The patient's presenting symptoms include generalized weakness, fever. " The physical exam findings include:  unremarkable. " The initial radiographic and laboratory data are fever, elevated  creatinine, atelectasis on imaging    Status is: Observation  The patient remains OBS appropriate and will d/c before 2 midnights.  Dispo: The patient is from: Home              Anticipated d/c is to: Home              Anticipated d/c date is: 1 day              Patient currently is not medically stable to d/c.     Consultants  . None  Procedures   . None  Time Spent on Admission: 66 minutes    Harold Hedge, DO [facility] Pager [phone] 03/04/2020, 6:14 PM

## 2020-03-05 ENCOUNTER — Encounter (HOSPITAL_COMMUNITY): Payer: Self-pay | Admitting: Internal Medicine

## 2020-03-05 DIAGNOSIS — R509 Fever, unspecified: Secondary | ICD-10-CM | POA: Diagnosis not present

## 2020-03-05 DIAGNOSIS — R6510 Systemic inflammatory response syndrome (SIRS) of non-infectious origin without acute organ dysfunction: Secondary | ICD-10-CM

## 2020-03-05 DIAGNOSIS — I700 Atherosclerosis of aorta: Secondary | ICD-10-CM

## 2020-03-05 DIAGNOSIS — R531 Weakness: Secondary | ICD-10-CM | POA: Diagnosis not present

## 2020-03-05 DIAGNOSIS — J439 Emphysema, unspecified: Secondary | ICD-10-CM

## 2020-03-05 DIAGNOSIS — N1832 Chronic kidney disease, stage 3b: Secondary | ICD-10-CM

## 2020-03-05 HISTORY — DX: Atherosclerosis of aorta: I70.0

## 2020-03-05 HISTORY — DX: Emphysema, unspecified: J43.9

## 2020-03-05 LAB — BASIC METABOLIC PANEL
Anion gap: 8 (ref 5–15)
BUN: 29 mg/dL — ABNORMAL HIGH (ref 8–23)
CO2: 27 mmol/L (ref 22–32)
Calcium: 9.1 mg/dL (ref 8.9–10.3)
Chloride: 103 mmol/L (ref 98–111)
Creatinine, Ser: 1.49 mg/dL — ABNORMAL HIGH (ref 0.61–1.24)
GFR calc Af Amer: 51 mL/min — ABNORMAL LOW (ref >60)
GFR calc non Af Amer: 44 mL/min — ABNORMAL LOW (ref >60)
Glucose, Bld: 90 mg/dL (ref 70–99)
Potassium: 3.9 mmol/L (ref 3.5–5.1)
Sodium: 138 mmol/L (ref 135–145)

## 2020-03-05 LAB — CBC
HCT: 34.6 % — ABNORMAL LOW (ref 39.0–52.0)
Hemoglobin: 11.6 g/dL — ABNORMAL LOW (ref 13.0–17.0)
MCH: 32.8 pg (ref 26.0–34.0)
MCHC: 33.5 g/dL (ref 30.0–36.0)
MCV: 97.7 fL (ref 80.0–100.0)
Platelets: 285 10*3/uL (ref 150–400)
RBC: 3.54 MIL/uL — ABNORMAL LOW (ref 4.22–5.81)
RDW: 13.1 % (ref 11.5–15.5)
WBC: 3.9 10*3/uL — ABNORMAL LOW (ref 4.0–10.5)
nRBC: 0 % (ref 0.0–0.2)

## 2020-03-05 LAB — MAGNESIUM: Magnesium: 2.1 mg/dL (ref 1.7–2.4)

## 2020-03-05 LAB — PHOSPHORUS: Phosphorus: 2.5 mg/dL (ref 2.5–4.6)

## 2020-03-05 LAB — URINE CULTURE: Culture: NO GROWTH

## 2020-03-05 MED ORDER — GABAPENTIN 300 MG PO CAPS: 300.0000 mg | ORAL_CAPSULE | Freq: Every day | ORAL | Status: DC

## 2020-03-05 MED ORDER — HYDRALAZINE HCL 50 MG PO TABS: 50.0000 mg | ORAL_TABLET | Freq: Two times a day (BID) | ORAL | Status: DC

## 2020-03-05 NOTE — Evaluation (Signed)
Physical Therapy Evaluation Patient Details Name: [name] Pratt MRN: [id number] DOB: 05/10/1941 Today's Date: 03/05/2020   History of Present Illness  79 year old male with past medical history of gout, anemia, hypertension, hypothyroidism, bilateral carotid artery stenosis, COPD, CKD 3b, HFpEF who presented on 6/23 to [facility] ED with complaints of progressively worsening generalized weakness x1 month and new onset fever to 50 F early on the day of admission. Dx of SIRS -unknown origin.  Clinical Impression  Pt ambulated 400' holding IV pole, no dyspnea, no dizziness, no loss of balance. At baseline he is independent with mobility, he uses orthotics for a L foot "ligament tear" for which he had recent outpatient PT. No further acute PT indicated as he is modified independent with mobility. Encouraged pt to ambulate in halls 2-3x/day to minimize deconditioning, encouraged pt to call staff prior to walking. PT signing off.    Follow Up Recommendations No PT follow up    Equipment Recommendations  None recommended by PT    Recommendations for Other Services       Precautions / Restrictions Precautions Precautions: None Precaution Comments: denies falls in past 1 year Restrictions Weight Bearing Restrictions: No      Mobility  Bed Mobility Overal bed mobility: Independent                Transfers Overall transfer level: Independent                  Ambulation/Gait Ambulation/Gait assistance: Modified independent (Device/Increase time) Gait Distance (Feet): 400 Feet Assistive device: IV Pole Gait Pattern/deviations: WFL(Within Functional Limits) Gait velocity: WNL   General Gait Details: no foot drop noted L foot (pt reports he recently had OPPT to address foot drop, balance problems, torn ligament in L foot), no loss of balance, pt denied dizziness  Stairs            Wheelchair Mobility    Modified Rankin (Stroke Patients Only)       Balance  Overall balance assessment: Modified Independent                                           Pertinent Vitals/Pain Pain Assessment: No/denies pain    Home Living Family/patient expects to be discharged to:: Private residence Living Arrangements: Spouse/significant other Available Help at Discharge: Family;Available 24 hours/day           Home Equipment: Walker - 2 wheels      Prior Function Level of Independence: Independent         Comments: mows lawn with push mower, denies falls in past 1 year, had recent OPPT to work on balance due to "torn ligament" in L foot with some foot drop, has orthotics and "special shoes"     Hand Dominance        Extremity/Trunk Assessment   Upper Extremity Assessment Upper Extremity Assessment: Overall WFL for tasks assessed    Lower Extremity Assessment Lower Extremity Assessment: Overall WFL for tasks assessed    Cervical / Trunk Assessment Cervical / Trunk Assessment: Normal  Communication   Communication: No difficulties  Cognition Arousal/Alertness: Awake/alert Behavior During Therapy: WFL for tasks assessed/performed Overall Cognitive Status: Within Functional Limits for tasks assessed  General Comments      Exercises     Assessment/Plan    PT Assessment Patent does not need any further PT services  PT Problem List         PT Treatment Interventions      PT Goals (Current goals can be found in the Care Plan section)  Acute Rehab PT Goals Patient Stated Goal: mow lawn PT Goal Formulation: All assessment and education complete, DC therapy    Frequency     Barriers to discharge        Co-evaluation               AM-PAC PT "6 Clicks" Mobility  Outcome Measure Help needed turning from your back to your side while in a flat bed without using bedrails?: None Help needed moving from lying on your back to sitting on the side of a  flat bed without using bedrails?: None Help needed moving to and from a bed to a chair (including a wheelchair)?: None Help needed standing up from a chair using your arms (e.g., wheelchair or bedside chair)?: None Help needed to walk in hospital room?: None Help needed climbing 3-5 steps with a railing? : None 6 Click Score: 24    End of Session Equipment Utilized During Treatment: Gait belt Activity Tolerance: Patient tolerated treatment well Patient left: in bed Nurse Communication: Mobility status      Time: 2947-6546 PT Time Calculation (min) (ACUTE ONLY): 16 min   Charges:   PT Evaluation $PT Eval Low Complexity: 1 Low         Philomena Doheny PT 03/05/2020  Acute Rehabilitation Services Pager [phone] Office [phone]

## 2020-03-05 NOTE — Discharge Summary (Signed)
Physician Discharge Summary  ANKITH EDMONSTON RCB:[id number] DOB: 1941-03-11 DOA: 03/03/2020  PCP: [name], NP  Admit date: 03/03/2020 Discharge date: 03/05/2020  Recommendations for Outpatient Follow-up:   Essential hypertension.  Given reported dizziness have held amlodipine.  Aortic atherosclerosis, emphysema, chronic bronchitic features seen on CT. --Continue Crestor.  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   Specialty: Internal Medicine Why: as needed Contact information: [address] [place] [address] [phone]                Discharge Diagnoses: Principal diagnosis is #1 1. Fever, SIRS 2. Reported generalized weakness 3. CKD stage IIIb 4. Chronic diastolic CHF 5. Aortic atherosclerosis, emphysema, chronic bronchitic features seen on CT.  Discharge Condition: improved Disposition: home  Diet recommendation: heart healthy  History of present illness:  79 year old man presented with generalized weakness and fever up to 102.  Recently seen by GI 6/23 for hemorrhoids and anemia. Initial work-up was unrevealing and he was transferred to [facility] for evaluation of SIRS and generalized weakness.  Hospital Course:  Patient was observed, no further antibiotics were given, no recurrent fever.  Clinical condition improved.  Did well with physical therapy, ambulating without difficulty, no complaints at this time.  Favor viral infection.  Appears stable for discharge.  Hospitalization uncomplicated.  Individual issues as below.  Fever, SIRS.  CT chest showed atelectasis.  SARS-CoV-2 negative --No evidence of bacterial infection.  Antibiotics held on admission.    Blood cultures pending. Urine culture no growth, final. --Afebrile greater than 36 hours, no leukocytosis, hemodynamic stable.  Favor  viral infection.  No further treatment indicated.  Reported generalized weakness.  Etiology unclear.  TSH recently within normal limits.  Hemoglobin A1c unremarkable in January.  No evidence of iron deficiency. --B12 within normal limits --Does not appear clinically significant.  Did well with physical therapy.  Follow-up as an outpatient.  CKD stage IIIb --Stable.  Appears to be at baseline.  Chronic diastolic CHF --Appears stable.   Resume Hyzaar on discharge.  Aortic atherosclerosis, emphysema, chronic bronchitic features seen on CT. --Continue Crestor.  Follow-up as an outpatient  DISH of spine --f/u as an outpatient  Today's assessment: S: Per RN there is an episode where patient complained of shortness of breath.  Usually uses CPAP at night but did not last night.  Vitals reported as stable.  Evaluated by physical therapy.  Ambulated 400 feet without shortness of breath or dizziness or loss of balance.  Currently patient feels much better.  Ambulating without difficulty. O: Vitals:  Vitals:   03/05/20 0806 03/05/20 0807  BP: (!) 154/76   Pulse: (!) 55   Resp: 18   Temp: 98.2 F (36.8 C)   SpO2:  96%    Constitutional:  . Appears calm and comfortable, ambulates in room without difficulty Respiratory:  . CTA bilaterally, no w/r/r.  . Respiratory effort normal.  Cardiovascular:  . RRR, no m/r/g Psychiatric:  . Mental status o Mood, affect appropriate  Creatinine down to 1.49.  Magnesium and phosphorus within normal limits. Modest leukopenia noted.  Hemoglobin stable 11.6. Blood and urine cultures pending  Discharge Instructions  Discharge Instructions    Diet - low sodium heart healthy   Complete by: As directed    Discharge instructions   Complete by: As directed    Call  your physician or seek immediate medical attention for fever, pain, confusion, falls, SOB, vomiting or worsening of condition.   Increase activity slowly   Complete by: As directed        Allergies as of 03/05/2020      Reactions   Elastic Bandages & [zinc] Other (See Comments)   Irritation   Metoclopramide Hcl    anxiety   Pravastatin    Muscle cramps   Sildenafil    Tachycardia      Medication List    STOP taking these medications   amLODipine 10 MG tablet Commonly known as: NORVASC   Colchicine 0.6 MG Caps Commonly known as: Mitigare   fluticasone 50 MCG/ACT nasal spray Commonly known as: FLONASE     TAKE these medications   albuterol 108 (90 Base) MCG/ACT inhaler Commonly known as: VENTOLIN HFA Inhale 2 puffs into the lungs every 6 (six) hours as needed for shortness of breath.   allopurinol 100 MG tablet Commonly known as: ZYLOPRIM Take 1 tablet (100 mg total) by mouth 2 (two) times daily.   aspirin EC 81 MG tablet Take 81 mg by mouth every morning.   Cranberry 500 MG Tabs Take 1 tablet by mouth daily.   cycloSPORINE 0.05 % ophthalmic emulsion Commonly known as: RESTASIS Place 1 drop into both eyes 2 (two) times daily.   diclofenac sodium 1 % Gel Commonly known as: VOLTAREN Apply 1 application topically daily as needed (arthritis).   docusate sodium 100 MG capsule Commonly known as: Colace Take 1 capsule (100 mg total) by mouth 2 (two) times daily as needed for mild constipation. What changed: when to take this   fenofibrate 145 MG tablet Commonly known as: TRICOR TAKE 1 TABLET EVERY DAY   FISH OIL PO Take 1 capsule by mouth daily.   gabapentin 300 MG capsule Commonly known as: NEURONTIN Take 1 capsule (300 mg total) by mouth at bedtime.   hydrALAZINE 50 MG tablet Commonly known as: APRESOLINE Take 1 tablet (50 mg total) by mouth in the morning and at bedtime.   IRON PO Take 50 mcg by mouth daily.   levothyroxine 150 MCG tablet Commonly known as: SYNTHROID TAKE 1 TABLET ONE TIME DAILY ON 6 DAYS PER WEEK AND 1/2 TABLET ON 1 DAY PER WEEK What changed:   how much to take  how to take this  when to take  this  additional instructions   losartan-hydrochlorothiazide 100-25 MG tablet Commonly known as: HYZAAR Take 1 tablet by mouth daily.   multivitamin per tablet Take 1 tablet by mouth daily.   Myrbetriq 25 MG Tb24 tablet Generic drug: mirabegron ER Take 25 mg by mouth daily.   nitroGLYCERIN 0.4 MG SL tablet Commonly known as: Nitrostat Place 1 tablet (0.4 mg total) under the tongue every 5 (five) minutes as needed for chest pain.   OPTIVE 0.5-0.9 % ophthalmic solution Generic drug: carboxymethylcellul-glycerin Apply 1 drop to eye daily as needed for dry eyes.   rosuvastatin 5 MG tablet Commonly known as: CRESTOR Take 1 tablet by mouth once daily   sodium chloride 0.65 % nasal spray Commonly known as: OCEAN Place 1 spray into both nostrils at bedtime as needed for congestion.      Allergies  Allergen Reactions  . Elastic Bandages & [Zinc] Other (See Comments)    Irritation  . Metoclopramide Hcl     anxiety  . Pravastatin     Muscle cramps  . Sildenafil     Tachycardia  The results of significant diagnostics from this hospitalization (including imaging, microbiology, ancillary and laboratory) are listed below for reference.    Significant Diagnostic Studies: DG Chest 2 View  Result Date: 03/03/2020 CLINICAL DATA:  Fever and nausea EXAM: CHEST - 2 VIEW COMPARISON:  01/16/2019, chest CT 05/22/2016 FINDINGS: Possible ground-glass infiltrates in the lower lobes. No pleural effusion. Normal heart size. No pneumothorax. Possible round nodule projecting posteriorly over the spine. IMPRESSION: 1. Possible subtle ground-glass infiltrates/mild pneumonia in the lower lobes 2. Round opacity posteriorly projecting over the spine on lateral view indeterminate for pulmonary nodule or possibly related to the skeleton. Consider chest CT for further evaluation. Electronically Signed   By: Donavan Foil M.D.   On: 03/03/2020 22:38   CT Chest Wo Contrast  Result Date:  03/03/2020 CLINICAL DATA:  Fever and nausea, abnormal chest radiograph EXAM: CT CHEST WITHOUT CONTRAST TECHNIQUE: Multidetector CT imaging of the chest was performed following the standard protocol without IV contrast. COMPARISON:  Chest CTA 05/22/2016, radiograph 03/03/2020 FINDINGS: Cardiovascular: Cardiac size within normal limits. Three-vessel coronary artery disease is noted. No pericardial effusion. Calcifications present upon the mitral annulus and aortic leaflets. Atherosclerotic plaque within the normal caliber aorta. Shared origin of the brachiocephalic and left common carotid arteries. Atheromatous plaque in the proximal great vessels. Central pulmonary arteries are normal caliber. Luminal evaluation of the vasculature precluded in the absence of contrast media. Mediastinum/Nodes: No mediastinal fluid or gas. Diminutive appearance of the thyroid without concerning nodule. No acute abnormality of the trachea. Stable appearance of a sliding-type hiatal hernia. No worrisome mediastinal or axillary adenopathy. Hilar nodal evaluation is limited in the absence of intravenous contrast media. Lungs/Pleura: Stable region of pleuroparenchymal scarring in the left lung apex. Diffuse centrilobular and paraseptal emphysema with apical to basilar gradient. Mild bronchitic features with diffuse airways thickening and some scattered secretions. Few bandlike areas of atelectasis are present in the lung base in may correspond to the abnormality seen on comparison radiography. No consolidation, features of edema, pneumothorax, or effusion. Few stable sub 3 mm likely inflammatory nodules are present in the right upper lobe/apex (3/41, 37, 30). No new or concerning pulmonary nodules or masses. Upper Abdomen: Post cholecystectomy. Subcentimeter hypodense foci in the dome of the right liver, too small to fully characterize on CT imaging but statistically likely benign. Stable mild bilateral perinephric stranding. Fluid  attenuation cyst in the upper pole right kidney, present on priors. No acute abnormalities present in the visualized portions of the upper abdomen. Musculoskeletal: Multilevel degenerative changes are present in the imaged portions of the spine. Multilevel flowing anterior osteophytosis, compatible with features of diffuse idiopathic skeletal hyperostosis (DISH). Additional degenerative changes in the shoulders with postsurgical change of the left humerus likely from prior rotator cuff repair. Mild bilateral gynecomastia. IMPRESSION: 1. Bandlike areas of atelectasis in the left lung base may correspond to the abnormality seen on comparison radiography. 2. No other acute intrathoracic process. Specifically, no evidence of pneumonia. 3. Few stable, likely postinflammatory, sub 3 mm nodules in the right upper lobe. 4. Unchanged sliding-type hiatal hernia. 5. Aortic Atherosclerosis (ICD10-I70.0) 6. Emphysema (ICD10-J43.9) and chronic bronchitic features. May correlate with smoking history. Electronically Signed   By: Lovena Le M.D.   On: 03/03/2020 23:52    Microbiology: Recent Results (from the past 240 hour(s))  Fecal occult blood, imunochemical([facility])     Status: Abnormal   Collection Time: 02/27/20  4:06 PM   Specimen: Stool  Result Value Ref Range Status   Fecal  Occult Bld Positive (A) Negative Final  Urine culture     Status: None   Collection Time: 03/04/20  1:36 AM   Specimen: Urine, Random  Result Value Ref Range Status   Specimen Description   Final    URINE, RANDOM Performed at [facility], [address] [place], [place] [address]    Special Requests   Final    NONE Performed at [facility], [address] [place], [place] [address]    Culture   Final    NO GROWTH Performed at [facility], [address], [place], [place] [address]    Report Status 03/05/2020 FINAL  Final  SARS Coronavirus 2 by RT PCR (hospital order, performed in [facility] hospital lab) Nasopharyngeal Nasopharyngeal Swab     Status: None   Collection Time: 03/04/20  2:52 AM   Specimen: Nasopharyngeal Swab  Result Value Ref Range Status   SARS Coronavirus 2 NEGATIVE NEGATIVE Final    Comment: (NOTE) SARS-CoV-2 target nucleic acids are NOT DETECTED.  The SARS-CoV-2 RNA is generally detectable in upper and lower respiratory specimens during the acute phase of infection. The lowest concentration of SARS-CoV-2 viral copies this assay can detect is 250 copies / mL. A negative result does not preclude SARS-CoV-2 infection and should not be used as the sole basis for treatment or other patient management decisions.  A negative result may occur with improper specimen collection / handling, submission of specimen other than nasopharyngeal swab, presence of viral mutation(s) within the areas targeted by this assay, and inadequate number of viral copies (<250 copies / mL). A negative result must be combined with clinical observations, patient history, and epidemiological information.  Fact Sheet for Patients:   StrictlyIdeas.no  Fact Sheet for Healthcare Providers: BankingDealers.co.za  This test is not yet approved or  cleared by the [place] FDA and has been authorized for detection and/or diagnosis of SARS-CoV-2 by FDA under an Emergency Use Authorization (EUA).  This EUA will remain in effect (meaning this test can be used) for the duration of the COVID-19 declaration under Section 564(b)(1) of the Act, 21 U.S.C. section 360bbb-3(b)(1), unless the authorization is terminated or revoked sooner.  Performed at [facility], [address] [place], [place] [address]      Labs: Basic Metabolic Panel: Recent Labs  Lab 03/03/20 2232 03/04/20 1948 03/05/20 0334  NA 135 137 138  K 3.3* 3.6 3.9  CL 100 101 103  CO2 24 25 27   GLUCOSE 156* 104* 90  BUN 39* 32* 29*  CREATININE 1.98*  1.61* 1.49*  CALCIUM 9.2 9.1 9.1  MG  --   --  2.1  PHOS  --   --  2.5   Liver Function Tests: Recent Labs  Lab 03/03/20 2232  AST 46*  ALT 29  ALKPHOS 41  BILITOT 0.5  PROT 7.9  ALBUMIN 4.2   CBC: Recent Labs  Lab 03/03/20 2232 03/04/20 1948 03/05/20 0334  WBC 6.4 4.8 3.9*  NEUTROABS 5.1  --   --   HGB 12.4* 12.3* 11.6*  HCT 38.0* 37.6* 34.6*  MCV 98.2 99.5 97.7  PLT 351 277 285    Active Problems:   SIRS (systemic inflammatory response syndrome) ([place])   Time coordinating discharge: 35 minutes  Signed:  Murray Hodgkins, MD  [facility]  03/05/2020, 2:13 PM

## 2020-03-05 NOTE — Progress Notes (Signed)
RN called to room for patient complaining of shortness of breath. Patient states he uses CPAP at night and did not use it last night. Vital signs stable. Elevated the head of the bed and placed patient on 1L of O2. Patient stated he feels much better. RN will continue to monitor the patient. Md notified.

## 2020-03-06 ENCOUNTER — Telehealth: Payer: Self-pay | Admitting: Family Medicine

## 2020-03-06 LAB — BLOOD CULTURE ID PANEL (REFLEXED)

## 2020-03-06 NOTE — Telephone Encounter (Signed)
Notified by micro for 1/4 GPC.  I called and spoke with the patient who reports he is doing very well, energy level is good, he is about to mow his yard.  I suspect contaminant but will follow culture results.  No intervention at this time.  Murray Hodgkins, MD [facility]

## 2020-03-08 ENCOUNTER — Telehealth: Payer: Self-pay | Admitting: *Deleted

## 2020-03-08 NOTE — Telephone Encounter (Signed)
Transition Care Management Follow-up Telephone Call   Date discharged? 03/05/20   How have you been since you were released from the hospital? "Doing much better"   Do you understand why you were in the hospital? yes   Do you understand the discharge instructions? yes   Where were you discharged to? Home w/ wife.   Items Reviewed:  Medications reviewed: yes  Allergies reviewed: yes  Dietary changes reviewed: yes  Referrals reviewed: yes   Functional Questionnaire:   Activities of Daily Living (ADLs):   He states they are independent in the following: ambulation, bathing and hygiene, feeding, continence, grooming, toileting and dressing States they require assistance with the following: na   Any transportation issues/concerns?: no   Any patient concerns? no   Confirmed importance and date/time of follow-up visits scheduled yes  Provider Appointment booked with PCP 03/12/20 @11 .  Confirmed with patient if condition begins to worsen call PCP or go to the ER.  Patient was given the office number and encouraged to call back with question or concerns.  : yes

## 2020-03-09 LAB — CULTURE, BLOOD (ROUTINE X 2)
Culture: NO GROWTH
Special Requests: ADEQUATE

## 2020-03-11 LAB — CULTURE, BLOOD (ROUTINE X 2): Special Requests: ADEQUATE

## 2020-03-12 ENCOUNTER — Ambulatory Visit (INDEPENDENT_AMBULATORY_CARE_PROVIDER_SITE_OTHER): Payer: Medicare HMO | Admitting: Family

## 2020-03-12 ENCOUNTER — Other Ambulatory Visit: Payer: Self-pay

## 2020-03-12 ENCOUNTER — Encounter: Payer: Self-pay | Admitting: Family

## 2020-03-12 VITALS — BP 142/54 | HR 63 | Temp 99.0°F | Resp 16 | Wt 156.8 lb

## 2020-03-12 DIAGNOSIS — N1832 Chronic kidney disease, stage 3b: Secondary | ICD-10-CM | POA: Diagnosis not present

## 2020-03-12 DIAGNOSIS — B349 Viral infection, unspecified: Secondary | ICD-10-CM | POA: Diagnosis not present

## 2020-03-12 DIAGNOSIS — I10 Essential (primary) hypertension: Secondary | ICD-10-CM | POA: Diagnosis not present

## 2020-03-12 DIAGNOSIS — G4733 Obstructive sleep apnea (adult) (pediatric): Secondary | ICD-10-CM | POA: Diagnosis not present

## 2020-03-12 DIAGNOSIS — D649 Anemia, unspecified: Secondary | ICD-10-CM | POA: Diagnosis not present

## 2020-03-12 NOTE — Progress Notes (Signed)
Subjective:    Patient ID: [name] Pratt, male    DOB: [birth year], 79 y.o.   MRN: [id number]  HPI  Patient is an 79 yr old male who presents today for hospital follow up. He was admitted 6/23-6/25/21.  Initial work-up was unrevealing however he was admitted for evaluation of SIRS and generalized weakness.  Initial CT of the chest showed atelectasis.  His COVID-19 testing was negative.  Blood cultures were performed.  Blood cultures were negative except for 2 cultures for coag negative staph.  He was not treated with antibiotics.  It was felt that his syndrome was likely viral in origin.  He did report generalized weakness during his hospitalization.  He did well with physical therapy.  They did not feel that he needed any further therapy at home.  He denies fever since coming home.  Hypertension-amlodipine was held during his hospitalization due to complaint of dizziness. He reports that his dizziness has improved since coming home. He does report some fatigue.   OSA- reports good compliance with his cpap.  Anemia- She saw Dr. Earlean Shawl. Pt is scheduled for endoscopy/colo in the end of August.   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   Review of Systems See HPI  Past Medical History:  Diagnosis Date  . Aortic atherosclerosis ([place]) 03/05/2020  . Arthritis   . Cancer ([place])    skin cancer  . Carotid stenosis    a. Carotid U/S 3/55: LICA < 73%, RICA 22-02%;  b.  Carotid U/S 5/42:  RICA 70-62%; LICA 3-76%; f/u 1 year  . Chest pain     Myoview in 2008 was normal.  Echo 7/09: EF 60%, normal wall motion, mild LVH, mild LAE.  [name] Complication of anesthesia    "stomach does not wake up"  . COPD  (chronic obstructive pulmonary disease) ([place])   . Emphysema, unspecified ([place]) 03/05/2020  . GERD (gastroesophageal reflux disease)   . Gout   . History of chicken pox   . History of hemorrhoids   . History of kidney stones   . Hyperlipidemia    under control  . Hypertension    under control  . Hypothyroidism   . Ileus ([place])   . Inguinal hernia   . OSA (obstructive sleep apnea)   . Pancreatitis   . PONV (postoperative nausea and vomiting)   . Rosacea   . 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     Social History   Socioeconomic History  . Marital status: Married    Spouse name: Not on file  . Number of children: 1  . Years of education: Not on file  . Highest education level: Not on file  Occupational History  . 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 Smoking status: Former Smoker    Years: 11.00    Types: Cigarettes    Quit date: 09/11/1978    Years since quitting: 41.5  . Smokeless tobacco: Never Used  Vaping Use  . Vaping Use: Never used  Substance and Sexual Activity  . Alcohol use: Yes  Alcohol/week: 0.0 standard drinks    Comment: rare  . Drug use: No  . Sexual activity: Not on file  Other Topics Concern  . Not on file  Social History Narrative   Lives with his wife   He has one son- lives locally.   2 grandchildren   Has worked for Research officer, trade union   Enjoys wood working   Completed HS.  [employer]   Social Determinants of Radio broadcast assistant Strain:   . Difficulty of Paying Living Expenses:   Food Insecurity:   . Worried About Charity fundraiser in the Last Year:   . Arboriculturist in the Last Year:   Transportation Needs:   . Film/video editor (Medical):   [name] Lack of Transportation (Non-Medical):   Physical Activity:   . Days of Exercise per Week:   . Minutes of Exercise per Session:   Stress:   . Feeling of Stress :   Social Connections:   .  Frequency of Communication with Friends and Family:   . Frequency of Social Gatherings with Friends and Family:   . Attends Religious Services:   . Active Member of Clubs or Organizations:   . Attends Archivist Meetings:   [name] Marital Status:   Intimate Partner Violence:   . Fear of Current or Ex-Partner:   . Emotionally Abused:   [name] Physically Abused:   . Sexually Abused:     Past Surgical History:  Procedure Laterality Date  . ABDOMINAL HERNIA REPAIR  07/15/12   Dr [name]  . BROW LIFT  05/07/01  . CYSTOSCOPY WITH URETEROSCOPY AND STENT PLACEMENT Right 01/28/2014   Procedure: CYSTOSCOPY WITH URETEROSCOPY, BASKET RETRIVAL AND  STENT PLACEMENT;  Surgeon: [name], MD;  Location: [facility];  Service: Urology;  Laterality: Right;  . epidural injections     multiple procedures  . [place]   multiple times  . EYE SURGERY Bilateral 03/25/10, 2012   cataract removal  . HEMORRHOID SURGERY  2014  . HIATAL HERNIA REPAIR  12/21/93  . HOLMIUM LASER APPLICATION Right 1/96/2229   Procedure: HOLMIUM LASER APPLICATION;  Surgeon: [name], MD;  Location: [facility];  Service: Urology;  Laterality: Right;  . INGUINAL HERNIA REPAIR Right 07/15/12   Dr [name], x2  . JOINT REPLACEMENT  07/25/04   right knee  . JOINT REPLACEMENT  07/13/10   left knee  . KNEE SURGERY  1995  . LAPAROSCOPIC CHOLECYSTECTOMY  09/20/06   with intraoperative cholangiogram and right inguinal herniorrhaphy with mesh   . LIGAMENT REPAIR Left 07/2013   shoulder  . LITHOTRIPSY     x2  . NASAL SEPTUM SURGERY  1975  . REFRACTIVE SURGERY Bilateral   . 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 SKIN CANCER DESTRUCTION     nose, ear  . TONSILLECTOMY  as child  . TOTAL HIP ARTHROPLASTY Left   . URETHRAL DILATION  1991   [name]    Family History  Problem Relation Age of Onset  . Cancer Mother   . Hypertension Other   . Cancer Other   . Osteoarthritis Other     Allergies  Allergen  Reactions  . Elastic Bandages & [Zinc] Other (See Comments)    Irritation  . Metoclopramide Hcl     anxiety  . Pravastatin     Muscle cramps  . Sildenafil     Tachycardia  Current Outpatient Medications on File Prior to Visit  Medication Sig Dispense Refill  . albuterol (PROVENTIL HFA;VENTOLIN HFA) 108 (90 Base) MCG/ACT inhaler Inhale 2 puffs into the lungs every 6 (six) hours as needed for shortness of breath. 3 Inhaler 1  . allopurinol (ZYLOPRIM) 100 MG tablet Take 1 tablet (100 mg total) by mouth 2 (two) times daily. 180 tablet 1  . aspirin EC 81 MG tablet Take 81 mg by mouth every morning.    . Carboxymethylcellul-Glycerin (OPTIVE) 0.5-0.9 % SOLN Apply 1 drop to eye daily as needed for dry eyes.     . Cranberry 500 MG TABS Take 1 tablet by mouth daily.    . cycloSPORINE (RESTASIS) 0.05 % ophthalmic emulsion Place 1 drop into both eyes 2 (two) times daily.    . diclofenac sodium (VOLTAREN) 1 % GEL Apply 1 application topically daily as needed (arthritis). 100 g 2  . docusate sodium (COLACE) 100 MG capsule Take 1 capsule (100 mg total) by mouth 2 (two) times daily as needed for mild constipation. (Patient taking differently: Take 100 mg by mouth daily as needed for mild constipation. ) 10 capsule 0  . fenofibrate (TRICOR) 145 MG tablet TAKE 1 TABLET EVERY DAY (Patient taking differently: Take 145 mg by mouth daily. ) 90 tablet 1  . gabapentin (NEURONTIN) 300 MG capsule Take 1 capsule (300 mg total) by mouth at bedtime.    . hydrALAZINE (APRESOLINE) 50 MG tablet Take 1 tablet (50 mg total) by mouth in the morning and at bedtime.    . IRON PO Take 50 mcg by mouth daily.    [name] levothyroxine (SYNTHROID) 150 MCG tablet TAKE 1 TABLET ONE TIME DAILY ON 6 DAYS PER WEEK AND 1/2 TABLET ON 1 DAY PER WEEK (Patient taking differently: Take 150 mcg by mouth See admin instructions. Takes 1 tablet daily except 1/2 tablet on Sunday.) 78 tablet 1  . losartan-hydrochlorothiazide (HYZAAR) 100-25 MG  tablet Take 1 tablet by mouth daily. 90 tablet 3  . multivitamin (THERAGRAN) per tablet Take 1 tablet by mouth daily.      [name] MYRBETRIQ 25 MG TB24 tablet Take 25 mg by mouth daily.     . nitroGLYCERIN (NITROSTAT) 0.4 MG SL tablet Place 1 tablet (0.4 mg total) under the tongue every 5 (five) minutes as needed for chest pain. 25 tablet 3  . Omega-3 Fatty Acids (FISH OIL PO) Take 1 capsule by mouth daily.     . rosuvastatin (CRESTOR) 5 MG tablet Take 1 tablet by mouth once daily 30 tablet 5  . sodium chloride (OCEAN) 0.65 % nasal spray Place 1 spray into both nostrils at bedtime as needed for congestion.    . [DISCONTINUED] allopurinol (ZYLOPRIM) 100 MG tablet Take 1 tablet (100 mg total) by mouth daily. 90 tablet 1   No current facility-administered medications on file prior to visit.    BP (!) 142/54 (BP Location: Right Arm, Patient Position: Sitting, Cuff Size: Small)   Pulse 63   Temp 99 F (37.2 C) (Temporal)   Resp 16   Wt 156 lb 12.8 oz (71.1 kg)   SpO2 99%   BMI 24.93 kg/m       Objective:   Physical Exam Constitutional:      General: He is not in acute distress.    Appearance: He is well-developed.  HENT:     Head: Normocephalic and atraumatic.  Cardiovascular:     Rate and Rhythm: Normal rate and regular rhythm.  Heart sounds: No murmur heard.   Pulmonary:     Effort: Pulmonary effort is normal. No respiratory distress.     Breath sounds: Normal breath sounds. No wheezing or rales.  Skin:    General: Skin is warm and dry.  Neurological:     Mental Status: He is alert and oriented to person, place, and time.  Psychiatric:        Behavior: Behavior normal.        Thought Content: Thought content normal.           Assessment & Plan:  Anemia-likely multifactorial in the setting of chronic kidney disease and heme positive stool.  He is scheduled for Colo and Endo later this summer with his GI specialist.  Chronic kidney disease-we will recheck creatinine  today.  Viral illness-clinically resolved.  He still is having some residual fatigue from his hospitalization.  Continue advance activity as tolerated.  Hypertension-his dizziness is improved following discontinuation of amlodipine.  I have advised him to remain off of amlodipine.  Blood pressure stable.  OSA-encouraged continued compliance with CPAP.  This visit occurred during the SARS-CoV-2 public health emergency.  Safety protocols were in place, including screening questions prior to the visit, additional usage of staff PPE, and extensive cleaning of exam room while observing appropriate contact time as indicated for disinfecting solutions.

## 2020-04-08 ENCOUNTER — Other Ambulatory Visit: Payer: Self-pay | Admitting: Family

## 2020-04-12 ENCOUNTER — Other Ambulatory Visit: Payer: Self-pay | Admitting: Family

## 2020-04-12 DIAGNOSIS — G4733 Obstructive sleep apnea (adult) (pediatric): Secondary | ICD-10-CM | POA: Diagnosis not present

## 2020-04-20 ENCOUNTER — Other Ambulatory Visit: Payer: Self-pay | Admitting: Family

## 2020-04-22 ENCOUNTER — Other Ambulatory Visit: Payer: Self-pay | Admitting: Family

## 2020-04-29 ENCOUNTER — Other Ambulatory Visit: Payer: Self-pay | Admitting: Family

## 2020-04-29 DIAGNOSIS — E039 Hypothyroidism, unspecified: Secondary | ICD-10-CM

## 2020-05-11 DIAGNOSIS — K219 Gastro-esophageal reflux disease without esophagitis: Secondary | ICD-10-CM | POA: Diagnosis not present

## 2020-05-11 DIAGNOSIS — K3189 Other diseases of stomach and duodenum: Secondary | ICD-10-CM | POA: Diagnosis not present

## 2020-05-11 DIAGNOSIS — D509 Iron deficiency anemia, unspecified: Secondary | ICD-10-CM | POA: Diagnosis not present

## 2020-05-11 DIAGNOSIS — D649 Anemia, unspecified: Secondary | ICD-10-CM | POA: Diagnosis not present

## 2020-05-11 DIAGNOSIS — R195 Other fecal abnormalities: Secondary | ICD-10-CM | POA: Diagnosis not present

## 2020-05-11 DIAGNOSIS — K648 Other hemorrhoids: Secondary | ICD-10-CM | POA: Diagnosis not present

## 2020-05-11 DIAGNOSIS — K5730 Diverticulosis of large intestine without perforation or abscess without bleeding: Secondary | ICD-10-CM | POA: Diagnosis not present

## 2020-05-11 DIAGNOSIS — K6389 Other specified diseases of intestine: Secondary | ICD-10-CM | POA: Diagnosis not present

## 2020-05-11 DIAGNOSIS — D508 Other iron deficiency anemias: Secondary | ICD-10-CM | POA: Diagnosis not present

## 2020-05-11 DIAGNOSIS — K449 Diaphragmatic hernia without obstruction or gangrene: Secondary | ICD-10-CM | POA: Diagnosis not present

## 2020-05-11 LAB — HM COLONOSCOPY

## 2020-05-13 DIAGNOSIS — G4733 Obstructive sleep apnea (adult) (pediatric): Secondary | ICD-10-CM | POA: Diagnosis not present

## 2020-05-18 DIAGNOSIS — H35343 Macular cyst, hole, or pseudohole, bilateral: Secondary | ICD-10-CM | POA: Diagnosis not present

## 2020-05-18 DIAGNOSIS — H18413 Arcus senilis, bilateral: Secondary | ICD-10-CM | POA: Diagnosis not present

## 2020-05-18 DIAGNOSIS — Z961 Presence of intraocular lens: Secondary | ICD-10-CM | POA: Diagnosis not present

## 2020-05-18 DIAGNOSIS — H04123 Dry eye syndrome of bilateral lacrimal glands: Secondary | ICD-10-CM | POA: Diagnosis not present

## 2020-05-27 ENCOUNTER — Telehealth: Payer: Self-pay | Admitting: Family

## 2020-05-27 NOTE — Progress Notes (Signed)
  Chronic Care Management   Note  05/27/2020 Name: [name] Pratt MRN: [id number] DOB: 01/26/1941  [name] Pratt is a 79 y.o. year old male who is a primary care patient of [name], NP. I reached out to [name] Pratt by phone today in response to a referral sent by Mr. Darnelle Going PCP, [name], NP.   [name] was given information about Chronic Care Management services today including:  1. CCM service includes personalized support from designated clinical staff supervised by his physician, including individualized plan of care and coordination with other care providers 2. 24/7 contact phone numbers for assistance for urgent and routine care needs. 3. Service will only be billed when office clinical staff spend 20 minutes or more in a month to coordinate care. 4. Only one practitioner may furnish and bill the service in a calendar month. 5. The patient may stop CCM services at any time (effective at the end of the month) by phone call to the office staff.   Patient agreed to services and verbal consent obtained.   Follow up plan:   Carley Perdue [employer] Scheduler

## 2020-05-28 ENCOUNTER — Other Ambulatory Visit: Payer: Self-pay

## 2020-05-28 ENCOUNTER — Encounter: Payer: Self-pay | Admitting: Family

## 2020-05-28 ENCOUNTER — Ambulatory Visit (INDEPENDENT_AMBULATORY_CARE_PROVIDER_SITE_OTHER): Payer: Medicare HMO | Admitting: Family

## 2020-05-28 VITALS — BP 129/67 | HR 76 | Temp 98.2°F | Resp 16 | Ht 66.5 in | Wt 154.0 lb

## 2020-05-28 DIAGNOSIS — Z23 Encounter for immunization: Secondary | ICD-10-CM | POA: Diagnosis not present

## 2020-05-28 DIAGNOSIS — M545 Low back pain, unspecified: Secondary | ICD-10-CM

## 2020-05-28 DIAGNOSIS — R29898 Other symptoms and signs involving the musculoskeletal system: Secondary | ICD-10-CM

## 2020-05-28 NOTE — Patient Instructions (Signed)
You should be contacted about scheduling your CT and MRI after we obtain insurance approval.

## 2020-05-28 NOTE — Progress Notes (Signed)
Subjective:    Patient ID: [name] Pratt, male    DOB: 1941-02-04, 79 y.o.   MRN: [id number]  HPI  HTN- reports that he has dizziness if he bends over.   BP Readings from Last 3 Encounters:  05/28/20 129/67  03/12/20 (!) 142/54  03/05/20 (!) 121/99   Reports + weakness from the waist down.  Reports that he starts to drag his feet after about 100 yards. He reports that he does have some low back pain with bending over.  Reports that he has pain in the top of his feet sometimes but no recent gout.   Reports + urinary incontinence- has been present for several years.  Denies stool incontinence.    Reports that he has fallen twice in the last week.  States that he has been dragging his right foot.    Review of Systems See HPI  Past Medical History:  Diagnosis Date  . Aortic atherosclerosis ([place]) 03/05/2020  . Arthritis   . Cancer ([place])    skin cancer  . Carotid stenosis    a. Carotid U/S 4/17: LICA < 40%, RICA 81-44%;  b.  Carotid U/S 8/18:  RICA 56-31%; LICA 4-97%; f/u 1 year  . Chest pain     Myoview in 2008 was normal.  Echo 7/09: EF 60%, normal wall motion, mild LVH, mild LAE.  [name] Complication of anesthesia    "stomach does not wake up"  . COPD (chronic obstructive pulmonary disease) ([place])   . Emphysema, unspecified ([place]) 03/05/2020  . GERD (gastroesophageal reflux disease)   . Gout   . History of chicken pox   . History of hemorrhoids   . History of kidney stones   . Hyperlipidemia    under control  . Hypertension    under control  . Hypothyroidism   . Ileus ([place])   . Inguinal hernia   . OSA (obstructive sleep apnea)   . Pancreatitis   . PONV (postoperative nausea and vomiting)   . Rosacea   . 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     Social History   Socioeconomic History  . Marital status: Married    Spouse name: Not on file  . Number of children: 1  . Years of education: Not on file  . Highest  education level: Not on file  Occupational History  . 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 Smoking status: Former Smoker    Years: 11.00    Types: Cigarettes    Quit date: 09/11/1978    Years since quitting: 41.7  . Smokeless tobacco: Never Used  Vaping Use  . Vaping Use: Never used  Substance and Sexual Activity  . Alcohol use: Yes    Alcohol/week: 0.0 standard drinks    Comment: rare  . Drug use: No  . Sexual activity: Not on file  Other Topics Concern  . Not on file  Social History Narrative   Lives with his wife   He has one son- lives locally.   2 grandchildren   Has worked for Research officer, trade union   Enjoys wood working   Completed HS.  [employer]   Social Determinants of Radio broadcast assistant Strain:   . Difficulty of Paying Living Expenses: Not on file  Food Insecurity:   . Worried About Charity fundraiser in the Last Year: Not on file  . Ran Out  of Food in the Last Year: Not on file  Transportation Needs:   . Lack of Transportation (Medical): Not on file  . Lack of Transportation (Non-Medical): Not on file  Physical Activity:   . Days of Exercise per Week: Not on file  . Minutes of Exercise per Session: Not on file  Stress:   . Feeling of Stress : Not on file  Social Connections:   . Frequency of Communication with Friends and Family: Not on file  . Frequency of Social Gatherings with Friends and Family: Not on file  . Attends Religious Services: Not on file  . Active Member of Clubs or Organizations: Not on file  . Attends Archivist Meetings: Not on file  . Marital Status: Not on file  Intimate Partner Violence:   . Fear of Current or Ex-Partner: Not on file  . Emotionally Abused: Not on file  . Physically Abused: Not on file  . Sexually Abused: Not on file    Past Surgical History:  Procedure Laterality Date  . ABDOMINAL HERNIA REPAIR  07/15/12   Dr [name]  . BROW LIFT  05/07/01  . CYSTOSCOPY WITH  URETEROSCOPY AND STENT PLACEMENT Right 01/28/2014   Procedure: CYSTOSCOPY WITH URETEROSCOPY, BASKET RETRIVAL AND  STENT PLACEMENT;  Surgeon: [name], MD;  Location[facility];  Service: Urology;  Laterality: Right;  . epidural injections     multiple procedures  . [place]   multiple times  . EYE SURGERY Bilateral 03/25/10, 2012   cataract removal  . HEMORRHOID SURGERY  2014  . HIATAL HERNIA REPAIR  12/21/93  . HOLMIUM LASER APPLICATION Right 01/05/622   Procedure: HOLMIUM LASER APPLICATION;  Surgeon: [name], MD;  Location: [facility];  Service: Urology;  Laterality: Right;  . INGUINAL HERNIA REPAIR Right 07/15/12   Dr [name], x2  . JOINT REPLACEMENT  07/25/04   right knee  . JOINT REPLACEMENT  07/13/10   left knee  . KNEE SURGERY  1995  . LAPAROSCOPIC CHOLECYSTECTOMY  09/20/06   with intraoperative cholangiogram and right inguinal herniorrhaphy with mesh   . LIGAMENT REPAIR Left 07/2013   shoulder  . LITHOTRIPSY     x2  . NASAL SEPTUM SURGERY  1975  . REFRACTIVE SURGERY Bilateral   . 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 SKIN CANCER DESTRUCTION     nose, ear  . TONSILLECTOMY  as child  . TOTAL HIP ARTHROPLASTY Left   . URETHRAL DILATION  1991   [name]    Family History  Problem Relation Age of Onset  . Cancer Mother   . Hypertension Other   . Cancer Other   . Osteoarthritis Other     Allergies  Allergen Reactions  . Elastic Bandages & [Zinc] Other (See Comments)    Irritation  . Metoclopramide Hcl     anxiety  . Pravastatin     Muscle cramps  . Sildenafil     Tachycardia      Current Outpatient Medications on File Prior to Visit  Medication Sig Dispense Refill  . albuterol (PROVENTIL HFA;VENTOLIN HFA) 108 (90 Base) MCG/ACT inhaler Inhale 2 puffs into the lungs every 6 (six) hours as needed for shortness of breath. 3 Inhaler 1  . allopurinol (ZYLOPRIM) 100 MG tablet TAKE 1 TABLET TWICE DAILY 180 tablet 1  . aspirin EC 81 MG  tablet Take 81 mg by mouth every morning.    . Carboxymethylcellul-Glycerin (OPTIVE) 0.5-0.9 %  SOLN Apply 1 drop to eye daily as needed for dry eyes.     . Cranberry 500 MG TABS Take 1 tablet by mouth daily.    . cycloSPORINE (RESTASIS) 0.05 % ophthalmic emulsion Place 1 drop into both eyes 2 (two) times daily.    . diclofenac sodium (VOLTAREN) 1 % GEL Apply 1 application topically daily as needed (arthritis). 100 g 2  . docusate sodium (COLACE) 100 MG capsule Take 1 capsule (100 mg total) by mouth 2 (two) times daily as needed for mild constipation. (Patient taking differently: Take 100 mg by mouth daily as needed for mild constipation. ) 10 capsule 0  . fenofibrate (TRICOR) 145 MG tablet TAKE 1 TABLET EVERY DAY 90 tablet 1  . gabapentin (NEURONTIN) 300 MG capsule TAKE 1 CAPSULE TWO TO THREE TIMES DAILY AS NEEDED 60 capsule 1  . hydrALAZINE (APRESOLINE) 50 MG tablet Take 1 tablet (50 mg total) by mouth in the morning and at bedtime.    . IRON PO Take 50 mcg by mouth daily.    [name] levothyroxine (SYNTHROID) 150 MCG tablet TAKE 1 TABLET ONE TIME DAILY ON 6 DAYS PER WEEK AND 1/2 TABLET ON 1 DAY PER WEEK 78 tablet 1  . losartan-hydrochlorothiazide (HYZAAR) 100-25 MG tablet Take 1 tablet by mouth daily. 90 tablet 3  . multivitamin (THERAGRAN) per tablet Take 1 tablet by mouth daily.      [name] MYRBETRIQ 25 MG TB24 tablet Take 25 mg by mouth daily.     . nitroGLYCERIN (NITROSTAT) 0.4 MG SL tablet Place 1 tablet (0.4 mg total) under the tongue every 5 (five) minutes as needed for chest pain. 25 tablet 3  . Omega-3 Fatty Acids (FISH OIL PO) Take 1 capsule by mouth daily.     . rosuvastatin (CRESTOR) 5 MG tablet Take 1 tablet by mouth once daily 90 tablet 0  . sodium chloride (OCEAN) 0.65 % nasal spray Place 1 spray into both nostrils at bedtime as needed for congestion.    . [DISCONTINUED] allopurinol (ZYLOPRIM) 100 MG tablet Take 1 tablet (100 mg total) by mouth daily. 90 tablet 1   No current  facility-administered medications on file prior to visit.    BP 129/67 (BP Location: Right Arm, Patient Position: Sitting, Cuff Size: Small)   Pulse 76   Temp 98.2 F (36.8 C) (Oral)   Resp 16   Ht 5' 6.5" (1.689 m)   Wt 154 lb (69.9 kg)   SpO2 97%   BMI 24.48 kg/m       Objective:   Physical Exam Constitutional:      General: He is not in acute distress.    Appearance: He is well-developed.  HENT:     Head: Normocephalic and atraumatic.  Cardiovascular:     Rate and Rhythm: Normal rate and regular rhythm.     Pulses:          Dorsalis pedis pulses are 2+ on the right side and 2+ on the left side.     Heart sounds: No murmur heard.   Pulmonary:     Effort: Pulmonary effort is normal. No respiratory distress.     Breath sounds: Normal breath sounds. No wheezing or rales.  Musculoskeletal:     Right lower leg: No edema.     Left lower leg: No edema.  Skin:    General: Skin is warm and dry.  Neurological:     Mental Status: He is alert and oriented to person, place, and time.  Deep Tendon Reflexes:     Reflex Scores:      Patellar reflexes are 2+ on the right side and 2+ on the left side.    Comments: RLE strength is 4/5 LLE strength is 4-5/5 RUE strength is 4-5/5 LUE strength is 5/5  Psychiatric:        Behavior: Behavior normal.        Thought Content: Thought content normal.   A/P  Low back pain/LE weakness R>L- will obtain MRI to rule out dangerous spinal pathology.  RUE/RLE weakness, also having some dizziness- would like to obtain CT head to rule out CVA.  This visit occurred during the SARS-CoV-2 public health emergency.  Safety protocols were in place, including screening questions prior to the visit, additional usage of staff PPE, and extensive cleaning of exam room while observing appropriate contact time as indicated for disinfecting solutions.

## 2020-06-02 DIAGNOSIS — L821 Other seborrheic keratosis: Secondary | ICD-10-CM | POA: Diagnosis not present

## 2020-06-02 DIAGNOSIS — Z85828 Personal history of other malignant neoplasm of skin: Secondary | ICD-10-CM | POA: Diagnosis not present

## 2020-06-02 DIAGNOSIS — L570 Actinic keratosis: Secondary | ICD-10-CM | POA: Diagnosis not present

## 2020-06-05 ENCOUNTER — Other Ambulatory Visit: Payer: Self-pay

## 2020-06-05 ENCOUNTER — Ambulatory Visit (HOSPITAL_BASED_OUTPATIENT_CLINIC_OR_DEPARTMENT_OTHER)
Admission: RE | Admit: 2020-06-05 | Discharge: 2020-06-05 | Disposition: A | Discharge status: Home / Self Care | Payer: Medicare HMO | Source: Ambulatory Visit | Attending: Family | Admitting: Family

## 2020-06-05 DIAGNOSIS — R531 Weakness: Secondary | ICD-10-CM | POA: Diagnosis not present

## 2020-06-05 DIAGNOSIS — R29898 Other symptoms and signs involving the musculoskeletal system: Secondary | ICD-10-CM

## 2020-06-05 DIAGNOSIS — G319 Degenerative disease of nervous system, unspecified: Secondary | ICD-10-CM | POA: Diagnosis not present

## 2020-06-05 DIAGNOSIS — M25552 Pain in left hip: Secondary | ICD-10-CM | POA: Diagnosis not present

## 2020-06-05 DIAGNOSIS — R200 Anesthesia of skin: Secondary | ICD-10-CM | POA: Diagnosis not present

## 2020-06-05 DIAGNOSIS — M25551 Pain in right hip: Secondary | ICD-10-CM | POA: Diagnosis not present

## 2020-06-05 DIAGNOSIS — I6389 Other cerebral infarction: Secondary | ICD-10-CM | POA: Diagnosis not present

## 2020-06-05 DIAGNOSIS — R29818 Other symptoms and signs involving the nervous system: Secondary | ICD-10-CM | POA: Diagnosis not present

## 2020-06-07 ENCOUNTER — Telehealth: Payer: Self-pay | Admitting: Family

## 2020-06-07 DIAGNOSIS — M545 Low back pain, unspecified: Secondary | ICD-10-CM

## 2020-06-07 NOTE — Telephone Encounter (Signed)
See mychart.  

## 2020-06-14 ENCOUNTER — Ambulatory Visit: Payer: Medicare HMO | Admitting: Family

## 2020-06-15 ENCOUNTER — Encounter: Payer: Self-pay | Admitting: Family

## 2020-06-15 ENCOUNTER — Ambulatory Visit (INDEPENDENT_AMBULATORY_CARE_PROVIDER_SITE_OTHER): Payer: Medicare HMO | Admitting: Family

## 2020-06-15 ENCOUNTER — Telehealth: Payer: Self-pay | Admitting: Family

## 2020-06-15 ENCOUNTER — Other Ambulatory Visit: Payer: Self-pay

## 2020-06-15 VITALS — BP 136/59 | HR 69 | Temp 98.0°F | Resp 16 | Wt 155.2 lb

## 2020-06-15 DIAGNOSIS — R29898 Other symptoms and signs involving the musculoskeletal system: Secondary | ICD-10-CM

## 2020-06-15 DIAGNOSIS — E785 Hyperlipidemia, unspecified: Secondary | ICD-10-CM

## 2020-06-15 DIAGNOSIS — I10 Essential (primary) hypertension: Secondary | ICD-10-CM | POA: Diagnosis not present

## 2020-06-15 DIAGNOSIS — J45909 Unspecified asthma, uncomplicated: Secondary | ICD-10-CM

## 2020-06-15 DIAGNOSIS — M5412 Radiculopathy, cervical region: Secondary | ICD-10-CM | POA: Diagnosis not present

## 2020-06-15 DIAGNOSIS — E1169 Type 2 diabetes mellitus with other specified complication: Secondary | ICD-10-CM | POA: Diagnosis not present

## 2020-06-15 DIAGNOSIS — E039 Hypothyroidism, unspecified: Secondary | ICD-10-CM | POA: Diagnosis not present

## 2020-06-15 MED ORDER — MYRBETRIQ 25 MG PO TB24: 25.0000 mg | ORAL_TABLET | Freq: Every day | ORAL | 2 refills | Status: DC

## 2020-06-15 MED ORDER — ALBUTEROL SULFATE HFA 108 (90 BASE) MCG/ACT IN AERS: 2.0000 | INHALATION_SPRAY | Freq: Four times a day (QID) | RESPIRATORY_TRACT | 3 refills | Status: DC | PRN

## 2020-06-15 NOTE — Patient Instructions (Signed)
Please complete lab work prior to leaving.  You should be contacted about scheduling your MRI. Please let me know if you have any trouble getting set up with neurosurgery.

## 2020-06-15 NOTE — Telephone Encounter (Signed)
Per patient, his neurosurgeon would like for him to hold off until he receives  both MRIs of lower back and spine.

## 2020-06-15 NOTE — Progress Notes (Signed)
Subjective:    Patient ID: [name] Pratt, male    DOB: [birth year], 79 y.o.   MRN: [id number]  HPI   Patient is an 79 yr old male who presents today for follow up.  Last visit he described LE weakness and dragging of his feet as well as low back pain. We obtained an MRI of his lumbar spine with below results. A referral was placed to neurosurgery and he plans to speak with them this afternoon to schedule.  IMPRESSION: 1. 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 2. Severe foraminal stenosis on the right at L2-L3 and moderate to severe bilateral foraminal stenosis bilaterally at L3-L4 and on the left at L4-L5. Moderate foraminal stenosis on the right at L4-L5. 3. Similar left foraminal/extraforaminal protrusion at L1-L2, which contacts the exiting left L1 nerve root. 4. No significant canal stenosis. Right subarticular recess stenosis at L2-L3 and L3-L4, with possible impingement of the descending right L4 nerve root at L3-L4.  Continues to have aching into the right hip and lower spine. He also has arthritis in his neck.  Pain radiates down into the shoulder.    Lab Results  Component Value Date   HGBA1C 5.6 09/19/2019   He also described some RUE weakness. We performed a CT head which was negative for CVA.  He reports that he continues to have bilateral finger numbness, trouble holding his toothbrush with the right hand, neck pain and shoulder pain. Notes that his right knee "goes out on me" sometimes.   Hyperlipidemia- maintained on tricor 145mg  and and crestor 5mg .  Asthma- stable, requesting refill on albuterol.  HTN- maintained on losartan-hctz 100-25, hydralazine 50mg  bid. Review of Systems See HPI    Past Medical History:  Diagnosis Date   Aortic atherosclerosis ([place]) 03/05/2020   Arthritis    Cancer ([place])    skin cancer   Carotid stenosis    a. Carotid U/S 2/09: LICA < 47%, RICA 09-62%;  b.  Carotid U/S 8/36:  RICA 62-94%; LICA 7-65%; f/u 1  year   Chest pain     Myoview in 2008 was normal.  Echo 7/09: EF 60%, normal wall motion, mild LVH, mild LAE.   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     Social History   Socioeconomic History   Marital status: Married    Spouse name: Not on file   Number of children: 1   Years of education: Not on file   Highest education level: Not on file  Occupational History   Occupation: firefighter    Comment: paints on the side  Tobacco Use   Smoking status: Former Smoker    Years: 11.00    Types: Cigarettes    Quit date: 09/11/1978    Years since quitting: 18.7  Smokeless tobacco: Never Used  Vaping Use   Vaping Use: Never used  Substance and Sexual Activity   Alcohol use: Yes    Alcohol/week: 0.0 standard drinks    Comment: rare   Drug use: No   Sexual activity: Not on file  Other Topics Concern   Not on file  Social History Narrative   Lives with his wife   He has one son- lives locally.   2 grandchildren   Has worked for [employer], trade union   Enjoys wood working   Completed HS.  [employer]   Social Determinants of Radio broadcast assistant Strain:    Difficulty of Paying Living Expenses: Not on file  Food Insecurity:    Worried About Charity fundraiser in the Last Year: Not on file   [employer] of Food in the Last Year: Not on file  Transportation Needs:    Lack of  Transportation (Medical): Not on file   Lack of Transportation (Non-Medical): Not on file  Physical Activity:    Days of Exercise per Week: Not on file   Minutes of Exercise per Session: Not on file  Stress:    Feeling of Stress : Not on file  Social Connections:    Frequency of Communication with Friends and Family: Not on file   Frequency of Social Gatherings with Friends and Family: Not on file   Attends Religious Services: Not on file   Active Member of Clubs or Organizations: Not on file   Attends Archivist Meetings: Not on file   Marital Status: Not on file  Intimate Partner Violence:    Fear of Current or Ex-Partner: Not on file   Emotionally Abused: Not on file   Physically Abused: Not on file   Sexually Abused: Not on file    Past Surgical History:  Procedure Laterality Date   ABDOMINAL HERNIA REPAIR  07/15/12   Dr [name]   BROW LIFT  05/07/01   CYSTOSCOPY WITH URETEROSCOPY AND STENT PLACEMENT Right 01/28/2014   Procedure: CYSTOSCOPY WITH URETEROSCOPY, [place];  Surgeon: [name], MD;  Location: [facility];  Service: Urology;  Laterality: Right;   epidural injections     multiple procedures   ESOPHAGEAL DILATION  1993 and 1994   multiple times   EYE SURGERY Bilateral 03/25/10, 2012   cataract removal   HEMORRHOID SURGERY  2014   HIATAL HERNIA REPAIR  12/21/93   HOLMIUM LASER APPLICATION Right 12/25/6061   Procedure: HOLMIUM LASER APPLICATION;  Surgeon: [name], MD;  Location: [facility];  Service: Urology;  Laterality: Right;   INGUINAL HERNIA REPAIR Right 07/15/12   Dr [name], x2   JOINT REPLACEMENT  07/25/04   right knee   JOINT REPLACEMENT  07/13/10   left knee   [place]  09/20/06   with intraoperative cholangiogram and right inguinal herniorrhaphy with mesh    LIGAMENT REPAIR Left 07/2013   shoulder   LITHOTRIPSY     x2   NASAL SEPTUM SURGERY   1975   REFRACTIVE SURGERY Bilateral    Right total hip replacement  4/14   SKIN CANCER DESTRUCTION     nose, ear   TONSILLECTOMY  as child   TOTAL HIP ARTHROPLASTY Left    URETHRAL DILATION  1991   [name]    Family History  Problem Relation Age of Onset   Cancer Mother    Hypertension Other  Cancer Other    Osteoarthritis Other     Allergies  Allergen Reactions   Elastic Bandages & [Zinc] Other (See Comments)    Irritation   Metoclopramide Hcl     anxiety   Pravastatin     Muscle cramps   Sildenafil     Tachycardia      Current Outpatient Medications on File Prior to Visit  Medication Sig Dispense Refill   albuterol (PROVENTIL HFA;VENTOLIN HFA) 108 (90 Base) MCG/ACT inhaler Inhale 2 puffs into the lungs every 6 (six) hours as needed for shortness of breath. 3 Inhaler 1   allopurinol (ZYLOPRIM) 100 MG tablet TAKE 1 TABLET TWICE DAILY 180 tablet 1   aspirin EC 81 MG tablet Take 81 mg by mouth every morning.     Carboxymethylcellul-Glycerin (OPTIVE) 0.5-0.9 % SOLN Apply 1 drop to eye daily as needed for dry eyes.      Cranberry 500 MG TABS Take 1 tablet by mouth daily.     cycloSPORINE (RESTASIS) 0.05 % ophthalmic emulsion Place 1 drop into both eyes 2 (two) times daily.     diclofenac sodium (VOLTAREN) 1 % GEL Apply 1 application topically daily as needed (arthritis). 100 g 2   docusate sodium (COLACE) 100 MG capsule Take 1 capsule (100 mg total) by mouth 2 (two) times daily as needed for mild constipation. (Patient taking differently: Take 100 mg by mouth daily as needed for mild constipation. ) 10 capsule 0   fenofibrate (TRICOR) 145 MG tablet TAKE 1 TABLET EVERY DAY 90 tablet 1   gabapentin (NEURONTIN) 300 MG capsule TAKE 1 CAPSULE TWO TO THREE TIMES DAILY AS NEEDED 60 capsule 1   hydrALAZINE (APRESOLINE) 50 MG tablet Take 1 tablet (50 mg total) by mouth in the morning and at bedtime.     IRON PO Take 50 mcg by mouth daily.      levothyroxine (SYNTHROID) 150 MCG tablet TAKE 1 TABLET ONE TIME DAILY ON 6 DAYS PER WEEK AND 1/2 TABLET ON 1 DAY PER WEEK 78 tablet 1   losartan-hydrochlorothiazide (HYZAAR) 100-25 MG tablet Take 1 tablet by mouth daily. 90 tablet 3   multivitamin (THERAGRAN) per tablet Take 1 tablet by mouth daily.       MYRBETRIQ 25 MG TB24 tablet Take 25 mg by mouth daily.      nitroGLYCERIN (NITROSTAT) 0.4 MG SL tablet Place 1 tablet (0.4 mg total) under the tongue every 5 (five) minutes as needed for chest pain. 25 tablet 3   Omega-3 Fatty Acids (FISH OIL PO) Take 1 capsule by mouth daily.      rosuvastatin (CRESTOR) 5 MG tablet Take 1 tablet by mouth once daily 90 tablet 0   sodium chloride (OCEAN) 0.65 % nasal spray Place 1 spray into both nostrils at bedtime as needed for congestion.     [DISCONTINUED] allopurinol (ZYLOPRIM) 100 MG tablet Take 1 tablet (100 mg total) by mouth daily. 90 tablet 1   No current facility-administered medications on file prior to visit.    BP (!) 136/59 (BP Location: Right Arm, Patient Position: Sitting, Cuff Size: Small)    Pulse 69    Temp 98 F (36.7 C) (Oral)    Resp 16    Wt 155 lb 3.2 oz (70.4 kg)    SpO2 99%    BMI 24.67 kg/m    Objective:   Physical Exam Constitutional:      General: He is not in acute distress.    Appearance: He is well-developed.  HENT:     Head: Normocephalic and atraumatic.  Cardiovascular:     Rate and Rhythm: Normal rate and regular rhythm.     Heart sounds: No murmur heard.   Pulmonary:     Effort: Pulmonary effort is normal. No respiratory distress.     Breath sounds: Normal breath sounds. No wheezing or rales.  Skin:    General: Skin is warm and dry.  Neurological:     Mental Status: He is alert and oriented to person, place, and time.     Comments: Decreased sensation to light touch in bilateral finger tips 4-5/5 RUE strength, 5/5 LUE strength   Psychiatric:        Behavior: Behavior normal.        Thought Content:  Thought content normal.           Assessment & Plan:  RUE weakness- Given his cervical radicular symptoms/bilateral hand numbness and neck pain, I would also like to obtain an MRI of his C spine. Hopefully this result will be available prior to his consultation with neurosurgery. We discussed possibility of a referral to orthopedics for his right knee (he has had a right knee replacement). He wishes to hold off on ortho referral and address his spinal issues first.  Asthma- stable. Continue albuterol.  HTN- bp stable, obtain cmet. Continue  losartan-hctz 100-25, hydralazine 50mg  bid.  Hyperlipidemia- obtain lipid panel. Continue  tricor 145mg  and and crestor 5mg .  Hypothyroid- clinically stable on synthroid 150 mcg.  Obtain follow up TSH.   This visit occurred during the SARS-CoV-2 public health emergency.  Safety protocols were in place, including screening questions prior to the visit, additional usage of staff PPE, and extensive cleaning of exam room while observing appropriate contact time as indicated for disinfecting solutions.

## 2020-06-16 LAB — COMPREHENSIVE METABOLIC PANEL
AG Ratio: 1.4 (calc) (ref 1.0–2.5)
ALT: 16 U/L (ref 9–46)
AST: 28 U/L (ref 10–35)
Albumin: 4.1 g/dL (ref 3.6–5.1)
Alkaline phosphatase (APISO): 38 U/L (ref 35–144)
BUN/Creatinine Ratio: 20 (calc) (ref 6–22)
BUN: 46 mg/dL — ABNORMAL HIGH (ref 7–25)
CO2: 28 mmol/L (ref 20–32)
Calcium: 9.8 mg/dL (ref 8.6–10.3)
Chloride: 104 mmol/L (ref 98–110)
Creat: 2.32 mg/dL — ABNORMAL HIGH (ref 0.70–1.11)
Globulin: 3 g/dL (calc) (ref 1.9–3.7)
Glucose, Bld: 175 mg/dL — ABNORMAL HIGH (ref 65–99)
Potassium: 4.3 mmol/L (ref 3.5–5.3)
Sodium: 142 mmol/L (ref 135–146)
Total Bilirubin: 0.5 mg/dL (ref 0.2–1.2)
Total Protein: 7.1 g/dL (ref 6.1–8.1)

## 2020-06-16 LAB — LIPID PANEL
Cholesterol: 106 mg/dL (ref <200)
HDL: 38 mg/dL — ABNORMAL LOW (ref >=40)
LDL Cholesterol (Calc): 50 mg/dL (calc)
Non-HDL Cholesterol (Calc): 68 mg/dL (calc) (ref <130)
Total CHOL/HDL Ratio: 2.8 (calc) (ref <5.0)
Triglycerides: 98 mg/dL (ref <150)

## 2020-06-16 LAB — TSH: TSH: 1.01 mIU/L (ref 0.40–4.50)

## 2020-06-16 NOTE — Telephone Encounter (Signed)
Neurosurgery will see patient after scans.

## 2020-06-17 DIAGNOSIS — Z87442 Personal history of urinary calculi: Secondary | ICD-10-CM | POA: Diagnosis not present

## 2020-06-17 DIAGNOSIS — E785 Hyperlipidemia, unspecified: Secondary | ICD-10-CM | POA: Diagnosis not present

## 2020-06-17 DIAGNOSIS — R252 Cramp and spasm: Secondary | ICD-10-CM | POA: Diagnosis not present

## 2020-06-17 DIAGNOSIS — I129 Hypertensive chronic kidney disease with stage 1 through stage 4 chronic kidney disease, or unspecified chronic kidney disease: Secondary | ICD-10-CM | POA: Diagnosis not present

## 2020-06-17 DIAGNOSIS — M109 Gout, unspecified: Secondary | ICD-10-CM | POA: Diagnosis not present

## 2020-06-17 DIAGNOSIS — N1832 Chronic kidney disease, stage 3b: Secondary | ICD-10-CM | POA: Diagnosis not present

## 2020-06-17 DIAGNOSIS — D649 Anemia, unspecified: Secondary | ICD-10-CM | POA: Diagnosis not present

## 2020-06-17 DIAGNOSIS — N2581 Secondary hyperparathyroidism of renal origin: Secondary | ICD-10-CM | POA: Diagnosis not present

## 2020-06-18 ENCOUNTER — Telehealth: Payer: Self-pay | Admitting: Family

## 2020-06-18 DIAGNOSIS — R0781 Pleurodynia: Secondary | ICD-10-CM

## 2020-06-18 NOTE — Telephone Encounter (Signed)
Patient states he had a fall on 06/17/20 and hurt his ribs. He is wondering if you order some x-ray. He has an appointment tomorrow for an MRI at 4 pm and is hoping to get x-rays at the same time.[name]

## 2020-06-19 ENCOUNTER — Ambulatory Visit (HOSPITAL_BASED_OUTPATIENT_CLINIC_OR_DEPARTMENT_OTHER)
Admission: RE | Admit: 2020-06-19 | Discharge: 2020-06-19 | Disposition: A | Discharge status: Home / Self Care | Payer: Medicare HMO | Source: Ambulatory Visit | Attending: Family | Admitting: Family

## 2020-06-19 ENCOUNTER — Other Ambulatory Visit: Payer: Self-pay

## 2020-06-19 DIAGNOSIS — M5412 Radiculopathy, cervical region: Secondary | ICD-10-CM | POA: Diagnosis not present

## 2020-06-19 DIAGNOSIS — M542 Cervicalgia: Secondary | ICD-10-CM | POA: Diagnosis not present

## 2020-06-21 ENCOUNTER — Other Ambulatory Visit: Payer: Self-pay

## 2020-06-21 ENCOUNTER — Ambulatory Visit (HOSPITAL_BASED_OUTPATIENT_CLINIC_OR_DEPARTMENT_OTHER)
Admission: RE | Admit: 2020-06-21 | Discharge: 2020-06-21 | Disposition: A | Discharge status: Home / Self Care | Payer: Medicare HMO | Source: Ambulatory Visit | Attending: Family | Admitting: Family

## 2020-06-21 ENCOUNTER — Telehealth: Payer: Self-pay | Admitting: Family

## 2020-06-21 DIAGNOSIS — R296 Repeated falls: Secondary | ICD-10-CM | POA: Diagnosis not present

## 2020-06-21 DIAGNOSIS — R0781 Pleurodynia: Secondary | ICD-10-CM | POA: Diagnosis not present

## 2020-06-21 DIAGNOSIS — I700 Atherosclerosis of aorta: Secondary | ICD-10-CM | POA: Diagnosis not present

## 2020-06-21 NOTE — Telephone Encounter (Signed)
[name] Pratt patient would like to speak to you again. Call him back at [phone]

## 2020-06-21 NOTE — Telephone Encounter (Signed)
Please advise pt that I have placed orders for a rib x-ray.

## 2020-06-21 NOTE — Telephone Encounter (Signed)
Reviewed MRI of the C spine.  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 Cord flattening. Early abnormal T2 signal affecting the cord.  I contacted [facility] and spoke with NP on call. Reviewed findings and clinical presentation with her.  She states that they will work him in today or tomorrow.    Rod Holler, can you please contact pt and let him know that MRI is showing compression of his spinal cord in his neck which is likely contributing to his weakness.  Neurosurgery should be reaching out to get him in today or tomorrow. Please let me know if he does not hear from them.

## 2020-06-21 NOTE — Telephone Encounter (Signed)
Patient advised xray ordered and he can come in and have it done anytime or call [phone] for appointment

## 2020-06-21 NOTE — Addendum Note (Signed)
Addended by: [name] on: 06/21/2020 01:46 PM   Modules accepted: Orders

## 2020-06-21 NOTE — Telephone Encounter (Signed)
Spoke to patient

## 2020-06-21 NOTE — Telephone Encounter (Signed)
Results and provider's comments given to patient over the phone.  He reports he is scheduled to see a provider at [facility] tomorrow, MRI report faxed to their office.

## 2020-06-22 ENCOUNTER — Ambulatory Visit: Payer: Medicare HMO | Admitting: Family

## 2020-06-22 DIAGNOSIS — G959 Disease of spinal cord, unspecified: Secondary | ICD-10-CM | POA: Diagnosis not present

## 2020-06-23 ENCOUNTER — Telehealth: Payer: Self-pay | Admitting: *Deleted

## 2020-06-23 ENCOUNTER — Other Ambulatory Visit: Payer: Self-pay | Admitting: Neurosurgery

## 2020-06-23 NOTE — Telephone Encounter (Signed)
   [facility] Pre-operative Risk Assessment    HEARTCARE STAFF: - Please ensure there is not already an duplicate clearance open for this procedure. - Under Visit Info/Reason for Call, type in Other and utilize the format Clearance MM/DD/YY or Clearance TBD. Do not use dashes or single digits. - If request is for dental extraction, please clarify the # of teeth to be extracted.  Request for surgical clearance:  1. What type of surgery is being performed? C3-4 ANTERIOR CERVICAL FUSION   2. When is this surgery scheduled? 07/09/20   3. What type of clearance is required (medical clearance vs. Pharmacy clearance to hold med vs. Both)? MEDICAL  4. Are there any medications that need to be held prior to surgery and how long?  ASA  5. Practice name and name of physician performing surgery? [facility]; DR. GARY CRAM   6. What is the office phone number? [phone]   7.   What is the office fax number? [place]: VANESSA  8.   Anesthesia type (None, local, MAC, general) ? GENERAL   Julaine Hua 06/23/2020, 5:06 PM  _________________________________________________________________   (provider comments below)

## 2020-06-24 NOTE — Telephone Encounter (Signed)
Primary Cardiologist:[name] Pratt Cancel, MD  Chart reviewed as part of pre-operative protocol coverage. Because of [name] past medical history and time since last visit, he/she will require a follow-up visit in order to better assess preoperative cardiovascular risk.  Pre-op covering staff: - Please schedule appointment and call patient to inform them. - Please contact requesting surgeon's office via preferred method (i.e, phone, fax) to inform them of need for appointment prior to surgery.  If applicable, this message will also be routed to pharmacy pool and/or primary cardiologist for input on holding anticoagulant/antiplatelet agent as requested below so that this information is available at time of patient's appointment.   Deberah Pelton, NP  06/24/2020, 7:53 AM

## 2020-06-24 NOTE — Telephone Encounter (Signed)
Pt has been scheduled pre op appt with Dr. Johnsie Cancel 06/28/20 @ 11:15. Will forward notes to MD for upcoming appt. Will send FYI to requesting office pt has appt. Will remove from the pre op call back pool.

## 2020-06-24 NOTE — Progress Notes (Signed)
Patient ID: [name] Pratt, male   DOB: 1941-07-27, 79 y.o.   MRN: [id number]     CARDIOLOGY OFFICE NOTE  Date:  06/28/2020    [name] Pratt Date of Birth: 1941-04-05 Medical Record #[id number]  PCP:  [name], NP  Cardiologist:  Johnsie Cancel    No chief complaint on file.   History of Present Illness:  79 y.o. history of HLD, GERD, left bruit , HTN and COPD. No history of CAD with normal myovue in 2008 and may of 2017. Last TTE done June 2017 with EF 55-60% AV sclerosis Duplex done March 2021 with new criteria read as plaque no stenosis bilaterally. 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   03/11/18 Atypical fleeting sharp chest pains Not exertional more relieved with his inhaler And he thinks its related to his COPD no further issues  March had UTI  Has lost 30 lbs Left foot swelling / pain with anterior tibialis tendon tear by [place] seen by Dr [facility] Suggested starting gabapentin for night cramps  And increasing allopurinol for gout and elevated uric acid He had ABI"s  11/03/19 non-compressible but wave forms mostly tri phasic   Has had COVID vaccine Wife has CAD and sees [facility] cardiologist May want to transfer here No angina compliant with meds   Having more back pain with arm  weakness MRI done 06/05/20 showed significant Foraminal stenosis referred to neuro surgery 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 Carotid U/S  3/08: LICA < 65%, RICA 78-46%;  b.  Carotid U/S 9/62:  RICA 95-28%; LICA 4-13%; f/u 1 year   Chest pain     Myoview in 2008 was normal.  Echo 7/09: EF 60%, normal wall motion, mild LVH, mild LAE.   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    Past Surgical History:  Procedure Laterality Date   ABDOMINAL HERNIA REPAIR  07/15/12   Dr Kalman Jewels LIFT  05/07/01   CYSTOSCOPY WITH URETEROSCOPY AND STENT PLACEMENT Right 01/28/2014   Procedure: CYSTOSCOPY WITH URETEROSCOPY, BASKET RETRIVAL AND  STENT PLACEMENT;  Surgeon: [name], MD;  Location: [facility];  Service: Urology;  Laterality: Right;  epidural injections     multiple procedures   ESOPHAGEAL DILATION  1993 and 1994   multiple times   EYE SURGERY Bilateral 03/25/10, 2012   cataract removal   HEMORRHOID SURGERY  2014   HIATAL HERNIA REPAIR  12/21/93   HOLMIUM LASER APPLICATION Right 9/41/7408   Procedure: HOLMIUM LASER APPLICATION;  Surgeon: [name], MD;  Location: [facility];  Service: Urology;  Laterality: Right;   INGUINAL HERNIA REPAIR Right 07/15/12   Dr [name], x2   JOINT REPLACEMENT  07/25/04   right knee   JOINT REPLACEMENT  07/13/10   left knee   [place]  09/20/06   with intraoperative cholangiogram and right inguinal herniorrhaphy with mesh    LIGAMENT REPAIR Left 07/2013   shoulder    LITHOTRIPSY     x2   NASAL SEPTUM SURGERY  1975   REFRACTIVE SURGERY Bilateral    Right total hip replacement  4/14   SKIN CANCER DESTRUCTION     nose, ear   TONSILLECTOMY  as child   TOTAL HIP ARTHROPLASTY Left    URETHRAL DILATION  1991   [name]     Medications: Current Outpatient Medications  Medication Sig Dispense Refill   albuterol (VENTOLIN HFA) 108 (90 Base) MCG/ACT inhaler Inhale 2 puffs into the lungs every 6 (six) hours as needed for shortness of breath. 1 each 3   allopurinol (ZYLOPRIM) 100 MG tablet TAKE 1 TABLET TWICE DAILY 180 tablet 1   amLODipine (NORVASC) 5 MG tablet Take 1 tablet (5 mg total) by mouth daily. 30 tablet 3   aspirin EC 81 MG tablet Take 81 mg by mouth every morning.     Carboxymethylcellul-Glycerin (OPTIVE) 0.5-0.9 % SOLN Apply 1 drop to eye daily as needed for dry eyes.      Cranberry 500 MG TABS Take 1 tablet by mouth daily.     cycloSPORINE (RESTASIS) 0.05 % ophthalmic emulsion Place 1 drop into both eyes 2 (two) times daily.     diclofenac sodium (VOLTAREN) 1 % GEL Apply 1 application topically daily as needed (arthritis). 100 g 2   docusate sodium (COLACE) 100 MG capsule Take 1 capsule (100 mg total) by mouth 2 (two) times daily as needed for mild constipation. (Patient taking differently: Take 100 mg by mouth daily as needed for mild constipation. ) 10 capsule 0   fenofibrate (TRICOR) 145 MG tablet TAKE 1 TABLET EVERY DAY 90 tablet 1   gabapentin (NEURONTIN) 300 MG capsule TAKE 1 CAPSULE TWO TO THREE TIMES DAILY AS NEEDED 60 capsule 1   hydrALAZINE (APRESOLINE) 50 MG tablet Take 1 tablet (50 mg total) by mouth in the morning and at bedtime.     IRON PO Take 50 mcg by mouth daily.     levothyroxine (SYNTHROID) 150 MCG tablet TAKE 1 TABLET ONE TIME DAILY ON 6 DAYS PER WEEK AND 1/2 TABLET ON 1 DAY PER WEEK 78 tablet 1   multivitamin (THERAGRAN) per tablet Take 1 tablet by mouth daily.       MYRBETRIQ 25 MG TB24 tablet Take  1 tablet (25 mg total) by mouth daily. 30 tablet 2   nitroGLYCERIN (NITROSTAT) 0.4 MG SL tablet Place 1 tablet (0.4 mg total) under the tongue every 5 (five) minutes as needed for chest pain. 25 tablet 3   Omega-3 Fatty Acids (FISH OIL PO) Take 1 capsule by mouth daily.      rosuvastatin (CRESTOR) 5 MG tablet Take  1 tablet by mouth once daily 90 tablet 0   sodium chloride (OCEAN) 0.65 % nasal spray Place 1 spray into both nostrils at bedtime as needed for congestion.     No current facility-administered medications for this visit.    Allergies: Allergies  Allergen Reactions   Elastic Bandages & [Zinc] Other (See Comments)    Irritation   Metoclopramide Hcl     anxiety   Pravastatin     Muscle cramps   Sildenafil     Tachycardia      Social History: The patient  reports that he quit smoking about 41 years ago. His smoking use included cigarettes. He quit after 11.00 years of use. He has never used smokeless tobacco. He reports current alcohol use. He reports that he does not use drugs.   Family History: The patient's family history includes Cancer in his mother and another family member; Hypertension in an other family member; Osteoarthritis in an other family member.   Review of Systems: Please see the history of present illness.   Otherwise, the review of systems is positive for none.   All other systems are reviewed and negative.   Physical Exam: VS:  There were no vitals taken for this visit. [name]  BMI There is no height or weight on file to calculate BMI.  Wt Readings from Last 3 Encounters:  06/15/20 155 lb 3.2 oz (70.4 kg)  05/28/20 154 lb (69.9 kg)  03/12/20 156 lb 12.8 oz (71.1 kg)   There were no vitals taken for this visit.   Affect appropriate Healthy:  appears stated age 82: normal Neck supple with no adenopathy JVP normal left bruits no thyromegaly Lungs clear with no wheezing and good diaphragmatic motion Heart:  S1/S2 no murmur, no rub, gallop  or click PMI normal Abdomen: benighn, BS positve, no tenderness, no AAA no bruit.  No HSM or HJR Distal pulses intact with no bruits No edema Neuro non-focal Skin warm and dry No muscular weakness Post bilateral TKR     LABORATORY DATA:  EKG:  12/02/19  SR rate 78  RBBB/LAFB PaC;s   Lab Results  Component Value Date   WBC 3.9 (L) 03/05/2020   HGB 11.6 (L) 03/05/2020   HCT 34.6 (L) 03/05/2020   PLT 285 03/05/2020   GLUCOSE 175 (H) 06/15/2020   CHOL 106 06/15/2020   TRIG 98 06/15/2020   HDL 38 (L) 06/15/2020   LDLCALC 50 06/15/2020   ALT 16 06/15/2020   AST 28 06/15/2020   NA 142 06/15/2020   K 4.3 06/15/2020   CL 104 06/15/2020   CREATININE 2.32 (H) 06/15/2020   BUN 46 (H) 06/15/2020   CO2 28 06/15/2020   TSH 1.01 06/15/2020   PSA 0.29 04/22/2018   INR 1.06 12/11/2012   HGBA1C 5.6 09/19/2019    BNP (last 3 results) No results for input(s): BNP in the last 8760 hours.  Other Studies Reviewed Today:  Echo 02/10/16 Myovue 01/25/16  Carotid 04/25/17   Assessment/Plan:  1. HTN -  See below on hydralazine and Hyzaar with bump in Cr to 2.3 latter stopped and norvasc started F/u primary will need recheck on BMET before surgery   2. HLD - on statin therapy.   3. Carotid disease -  Duplex 12/09/19 plaque no stenosis f/u 2 years   4. COPD with stable dyspnea.   5. CKD - worse with Cr 2.32 06/15/20 see above   6. Neuro:  myesthenia w/u pending vision better f/u [facility]  for 6th nerve palsy  7. 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 OSA: f/u [place] wearing CPAP  10. Spinal Stenosis : Ok to have surgery from cardiac perspective with Dr Saintclair Halsted 07/09/20 due to  Spinal cord compression and arm weakness Co morbidities of CRF, COPD and age biggest issue not cardiac     Current medicines are reviewed with the patient today.  The patient does not have concerns regarding  medicines other than what has been noted above.  The following changes have been made:  See above.  See above regarding changing BP meds    Disposition:    F/U 6 months   F.u with me in a year    Jenkins Rouge

## 2020-06-24 NOTE — Telephone Encounter (Signed)
DPR ok to s/w pt's wife. S/w pt's wife in regards to the pt will need a pre op appt with Dr. Johnsie Cancel or APP. I offered 06/28/20 @ 11:15 am. Pt's wife stated the pt has appt with neurology 10/18 @ 11:00. Wife stated she will ask the pt if he wants to change the neuro appt and schedule appt for pre op clearance in our office. I assured her what I will do is hold the time slot for 06/28/20 @ 11:15 with Dr. Johnsie Cancel. I asked if she could call back today and confirm if keeping 10/18 appt with Dr. Johnsie Cancel or need to change to another date. I advised they can s/w the operator/scheduler and let them know of the appt needed. If pt does opt for the 06/28/20 appt the appt will need to be made as the time slot is only being held right now for the pt. Pt's wife thanked me for the call and the help.

## 2020-06-25 ENCOUNTER — Telehealth: Payer: Self-pay | Admitting: Family

## 2020-06-25 MED ORDER — AMLODIPINE BESYLATE 5 MG PO TABS: 5.0000 mg | ORAL_TABLET | Freq: Every day | ORAL | 3 refills | Status: DC

## 2020-06-25 NOTE — Telephone Encounter (Signed)
Caller : Fredrick   Patient is returning a call from [place]

## 2020-06-25 NOTE — Telephone Encounter (Signed)
Patient reports nephrologist took him off losartan-HCTZ and put him on Losartan only. Per Lenna Sciara ok to follow nephrology advise and we will check his bp next week at his scheduled appointment.

## 2020-06-25 NOTE — Telephone Encounter (Signed)
Please contact pt and let him know that I reviewed his lab work with Dr. Johnsie Cancel. Since his recent kidney function had worsened, I would like for him to stop losartan HCTZ and instead start amlodipine. I would like him to check his bp once daily and call me on Tuesday with his blood pressure readings.   Rx has been sent to [facility].

## 2020-06-25 NOTE — Telephone Encounter (Signed)
Called patient but per wife he was not home. Will call him again later.

## 2020-06-28 ENCOUNTER — Other Ambulatory Visit: Payer: Self-pay

## 2020-06-28 ENCOUNTER — Ambulatory Visit: Payer: Medicare HMO | Admitting: Adult Health

## 2020-06-28 ENCOUNTER — Ambulatory Visit: Payer: Medicare HMO | Admitting: Cardiovascular Disease

## 2020-06-28 ENCOUNTER — Encounter: Payer: Self-pay | Admitting: Cardiovascular Disease

## 2020-06-28 VITALS — BP 136/64 | HR 78 | Ht 66.5 in | Wt 152.8 lb

## 2020-06-28 DIAGNOSIS — E785 Hyperlipidemia, unspecified: Secondary | ICD-10-CM | POA: Diagnosis not present

## 2020-06-28 DIAGNOSIS — I10 Essential (primary) hypertension: Secondary | ICD-10-CM | POA: Diagnosis not present

## 2020-06-28 DIAGNOSIS — M4710 Other spondylosis with myelopathy, site unspecified: Secondary | ICD-10-CM | POA: Diagnosis not present

## 2020-06-28 DIAGNOSIS — I779 Disorder of arteries and arterioles, unspecified: Secondary | ICD-10-CM

## 2020-06-28 NOTE — Patient Instructions (Addendum)

## 2020-06-29 ENCOUNTER — Ambulatory Visit (INDEPENDENT_AMBULATORY_CARE_PROVIDER_SITE_OTHER): Payer: Medicare HMO | Admitting: Family

## 2020-06-29 ENCOUNTER — Encounter: Payer: Self-pay | Admitting: Family

## 2020-06-29 VITALS — BP 134/60 | HR 68 | Temp 98.0°F | Resp 16 | Ht 66.0 in | Wt 147.0 lb

## 2020-06-29 DIAGNOSIS — N289 Disorder of kidney and ureter, unspecified: Secondary | ICD-10-CM | POA: Diagnosis not present

## 2020-06-29 DIAGNOSIS — M4802 Spinal stenosis, cervical region: Secondary | ICD-10-CM | POA: Diagnosis not present

## 2020-06-29 MED ORDER — LOSARTAN POTASSIUM 50 MG PO TABS: 50.0000 mg | ORAL_TABLET | Freq: Every day | ORAL | 3 refills | Status: DC

## 2020-06-29 NOTE — Patient Instructions (Addendum)
Please complete lab work prior to leaving.  Good luck with your upcoming surgery.

## 2020-06-29 NOTE — Progress Notes (Signed)
Subjective:    Patient ID: [name] Pratt, male    DOB: Aug 17, 1941, 79 y.o.   MRN: [id number]  HPI   Patient is an 79 yr old male who presents today for follow up. Since last visit he underwent an MRI of the C-spine which noted spinal stenosis including C3-4 spinal stenosis with AP diameter of the canal only 4.3 mm at this level, with cord flattening. There is early abnormal T2 signal affecting the cord.  Renal insufficiency- We sent his results back to his nephrologist who changed Losartan- hctz to losartan only.   Lab Results  Component Value Date   CREATININE 2.32 (H) 06/15/2020   Lab Results  Component Value Date   HGBA1C 5.6 09/19/2019    Review of Systems See HPI  Past Medical History:  Diagnosis Date  . Aortic atherosclerosis ([place]) 03/05/2020  . Arthritis   . Cancer ([place])    skin cancer  . Carotid stenosis    a. Carotid U/S 8/12: LICA < 75%, RICA 17-00%;  b.  Carotid U/S 1/74:  RICA 94-49%; LICA 6-75%; f/u 1 year  . Chest pain     Myoview in 2008 was normal.  Echo 7/09: EF 60%, normal wall motion, mild LVH, mild LAE.  [name] Complication of anesthesia    "stomach does not wake up"  . COPD (chronic obstructive pulmonary disease) ([place])   . Emphysema, unspecified ([place]) 03/05/2020  . GERD (gastroesophageal reflux disease)   . Gout   . History of chicken pox   . History of hemorrhoids   . History of kidney stones   . Hyperlipidemia    under control  . Hypertension    under control  . Hypothyroidism   . Ileus ([place])   . Inguinal hernia   . OSA (obstructive sleep apnea)   . Pancreatitis   . PONV (postoperative nausea and vomiting)   . Rosacea   . 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     Social History   Socioeconomic History  . Marital status: Married    Spouse name: Not on file  . Number of children: 1  . Years of education: Not on file  . Highest education level: Not on file  Occupational History  .  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 Smoking status: Former Smoker    Years: 11.00    Types: Cigarettes    Quit date: 09/11/1978    Years since quitting: 41.8  . Smokeless tobacco: Never Used  Vaping Use  . Vaping Use: Never used  Substance and Sexual Activity  . Alcohol use: Yes    Alcohol/week: 0.0 standard drinks    Comment: rare  . Drug use: No  . Sexual activity: Not on file  Other Topics Concern  . Not on file  Social History Narrative   Lives with his wife   He has one son- lives locally.   2 grandchildren   Has worked for Research officer, trade union   Enjoys wood working   Completed HS.  [employer]   Social Determinants of Radio broadcast assistant Strain:   . Difficulty of Paying Living Expenses: Not on file  Food Insecurity:   . Worried About Charity fundraiser in the Last Year: Not on file  . Ran Out of Food in the Last Year: Not on file  Transportation Needs:   . Lack of Transportation (Medical):  Not on file  . Lack of Transportation (Non-Medical): Not on file  Physical Activity:   . Days of Exercise per Week: Not on file  . Minutes of Exercise per Session: Not on file  Stress:   . Feeling of Stress : Not on file  Social Connections:   . Frequency of Communication with Friends and Family: Not on file  . Frequency of Social Gatherings with Friends and Family: Not on file  . Attends Religious Services: Not on file  . Active Member of Clubs or Organizations: Not on file  . Attends Archivist Meetings: Not on file  . Marital Status: Not on file  Intimate Partner Violence:   . Fear of Current or Ex-Partner: Not on file  . Emotionally Abused: Not on file  . Physically Abused: Not on file  . Sexually Abused: Not on file    Past Surgical History:  Procedure Laterality Date  . ABDOMINAL HERNIA REPAIR  07/15/12   Dr [name]  . BROW LIFT  05/07/01  . CYSTOSCOPY WITH URETEROSCOPY AND STENT PLACEMENT Right 01/28/2014    Procedure: CYSTOSCOPY WITH URETEROSCOPY, BASKET RETRIVAL AND  STENT PLACEMENT;  Surgeon: [name], MD;  Location: [facility];  Service: Urology;  Laterality: Right;  . epidural injections     multiple procedures  . [place]   multiple times  . EYE SURGERY Bilateral 03/25/10, 2012   cataract removal  . HEMORRHOID SURGERY  2014  . HIATAL HERNIA REPAIR  12/21/93  . HOLMIUM LASER APPLICATION Right 4/48/1856   Procedure: HOLMIUM LASER APPLICATION;  Surgeon: [name], MD;  Location: [facility];  Service: Urology;  Laterality: Right;  . INGUINAL HERNIA REPAIR Right 07/15/12   Dr [name], x2  . JOINT REPLACEMENT  07/25/04   right knee  . JOINT REPLACEMENT  07/13/10   left knee  . KNEE SURGERY  1995  . LAPAROSCOPIC CHOLECYSTECTOMY  09/20/06   with intraoperative cholangiogram and right inguinal herniorrhaphy with mesh   . LIGAMENT REPAIR Left 07/2013   shoulder  . LITHOTRIPSY     x2  . NASAL SEPTUM SURGERY  1975  . REFRACTIVE SURGERY Bilateral   . 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 SKIN CANCER DESTRUCTION     nose, ear  . TONSILLECTOMY  as child  . TOTAL HIP ARTHROPLASTY Left   . URETHRAL DILATION  1991   [name]    Family History  Problem Relation Age of Onset  . Cancer Mother   . Hypertension Other   . Cancer Other   . Osteoarthritis Other     Allergies  Allergen Reactions  . Elastic Bandages & [Zinc] Other (See Comments)    Irritation  . Metoclopramide Hcl     anxiety  . Pravastatin     Muscle cramps  . Sildenafil     Tachycardia      Current Outpatient Medications on File Prior to Visit  Medication Sig Dispense Refill  . albuterol (VENTOLIN HFA) 108 (90 Base) MCG/ACT inhaler Inhale 2 puffs into the lungs every 6 (six) hours as needed for shortness of breath. 1 each 3  . allopurinol (ZYLOPRIM) 100 MG tablet TAKE 1 TABLET TWICE DAILY 180 tablet 1  . amLODipine (NORVASC) 5 MG tablet Take 1 tablet (5 mg total) by mouth daily. 30 tablet 3   . aspirin EC 81 MG tablet Take 81 mg by mouth every morning.    . Carboxymethylcellul-Glycerin (OPTIVE) 0.5-0.9 % SOLN  Apply 1 drop to eye daily as needed for dry eyes.     . Cranberry 500 MG TABS Take 1 tablet by mouth daily.    . cycloSPORINE (RESTASIS) 0.05 % ophthalmic emulsion Place 1 drop into both eyes 2 (two) times daily.    . diclofenac sodium (VOLTAREN) 1 % GEL Apply 1 application topically daily as needed (arthritis). 100 g 2  . docusate sodium (COLACE) 100 MG capsule Take 1 capsule (100 mg total) by mouth 2 (two) times daily as needed for mild constipation. 10 capsule 0  . fenofibrate (TRICOR) 145 MG tablet TAKE 1 TABLET EVERY DAY 90 tablet 1  . gabapentin (NEURONTIN) 300 MG capsule TAKE 1 CAPSULE TWO TO THREE TIMES DAILY AS NEEDED 60 capsule 1  . hydrALAZINE (APRESOLINE) 50 MG tablet Take 1 tablet (50 mg total) by mouth in the morning and at bedtime.    . IRON PO Take 50 mcg by mouth daily.    [name] levothyroxine (SYNTHROID) 150 MCG tablet TAKE 1 TABLET ONE TIME DAILY ON 6 DAYS PER WEEK AND 1/2 TABLET ON 1 DAY PER WEEK 78 tablet 1  . multivitamin (THERAGRAN) per tablet Take 1 tablet by mouth daily.      [name] MYRBETRIQ 25 MG TB24 tablet Take 1 tablet (25 mg total) by mouth daily. 30 tablet 2  . nitroGLYCERIN (NITROSTAT) 0.4 MG SL tablet Place 1 tablet (0.4 mg total) under the tongue every 5 (five) minutes as needed for chest pain. 25 tablet 3  . Omega-3 Fatty Acids (FISH OIL PO) Take 1 capsule by mouth daily.     . rosuvastatin (CRESTOR) 5 MG tablet Take 1 tablet by mouth once daily 90 tablet 0  . sodium chloride (OCEAN) 0.65 % nasal spray Place 1 spray into both nostrils at bedtime as needed for congestion.     No current facility-administered medications on file prior to visit.    BP 134/60 (BP Location: Left Arm, Patient Position: Sitting, Cuff Size: Small)   Pulse 68   Temp 98 F (36.7 C) (Oral)   Resp 16   Ht 5\' 6"  (1.676 m)   Wt 147 lb (66.7 kg)   SpO2 100%   BMI 23.73 kg/m        Objective:   Physical Exam Constitutional:      General: He is not in acute distress.    Appearance: He is well-developed.  HENT:     Head: Normocephalic and atraumatic.  Cardiovascular:     Rate and Rhythm: Normal rate and regular rhythm.     Heart sounds: No murmur heard.   Pulmonary:     Effort: Pulmonary effort is normal. No respiratory distress.     Breath sounds: Normal breath sounds. No wheezing or rales.  Skin:    General: Skin is warm and dry.  Neurological:     Mental Status: He is alert and oriented to person, place, and time.  Psychiatric:        Behavior: Behavior normal.        Thought Content: Thought content normal.           Assessment & Plan:  Cervical Stenosis- scheduled for ACDF C3-C4 on 07/02/20.  He is having RLE and RUE weakness.    Acute on chronic renal insufficiency- Will repeat bmet today to reassess his Cr after discontinuation of hctz by nephrology.  He has not started amlodipine and I advised him to leave this aside for now.   HTN- bp is stable.  Continue losartan 50mg , hydralazine 50mg .  This visit occurred during the SARS-CoV-2 public health emergency.  Safety protocols were in place, including screening questions prior to the visit, additional usage of staff PPE, and extensive cleaning of exam room while observing appropriate contact time as indicated for disinfecting solutions.

## 2020-06-30 ENCOUNTER — Telehealth: Payer: Self-pay | Admitting: Family

## 2020-06-30 LAB — BASIC METABOLIC PANEL
BUN/Creatinine Ratio: 20 (calc) (ref 6–22)
BUN: 35 mg/dL — ABNORMAL HIGH (ref 7–25)
CO2: 29 mmol/L (ref 20–32)
Calcium: 9.5 mg/dL (ref 8.6–10.3)
Chloride: 105 mmol/L (ref 98–110)
Creat: 1.74 mg/dL — ABNORMAL HIGH (ref 0.70–1.11)
Glucose, Bld: 136 mg/dL — ABNORMAL HIGH (ref 65–99)
Potassium: 4.4 mmol/L (ref 3.5–5.3)
Sodium: 141 mmol/L (ref 135–146)

## 2020-06-30 NOTE — Telephone Encounter (Signed)
Called patient back, med list updated

## 2020-06-30 NOTE — Telephone Encounter (Signed)
Patient would like to review medication list. There is some missing.

## 2020-07-02 ENCOUNTER — Telehealth: Payer: Self-pay | Admitting: Family

## 2020-07-02 NOTE — Telephone Encounter (Signed)
Patient states he has not been able to use the bathroom in 3 days. He would like something for constipation.

## 2020-07-02 NOTE — Telephone Encounter (Signed)
Patient advised, per pcp, use 1 cap full of Miralax in 8 oz of water or juice today and if no bowel movement by tomorrow repeat in am

## 2020-07-06 DIAGNOSIS — G4733 Obstructive sleep apnea (adult) (pediatric): Secondary | ICD-10-CM | POA: Diagnosis not present

## 2020-07-06 NOTE — Progress Notes (Signed)
[place], [place] [address] Phone: [phone] Fax: [phone]  [place], [place] [address] Phone: [phone] Fax: [phone]      Your procedure is scheduled on Friday, October 29th.  Report to [facility] Main Entrance "A" at 10:00 A.M., and check in at the Admitting office.  Call this number if you have problems the morning of surgery:  [phone]  Call [phone] if you have any questions prior to your surgery date Monday-Friday 8am-4pm    Remember:  Do not eat or drink after midnight the night before your surgery     Take these medicines the morning of surgery with A SIP OF WATER   Tylenol - if needed  Albuterol inhaler - if needed (bring with you on day of surgery)  Allopurinol (Zyloprim)  Eye drops - if needed  Fenofibrate (Tricor)  Gabapentin (Neurontin)  Levothyroxine (Synthroid)  Myrbetriq  Nitroglycerin - if needed  Rosuvastatin (Crestor)  Nasal spray - if needed  Follow your surgeon's instructions on when to stop Aspirin.  If no instructions were given by your surgeon then you will need to call the office to get those instructions.    As of today, STOP taking any Aspirin (unless otherwise instructed by your surgeon) Aleve, Naproxen, Ibuprofen, Motrin, Advil, Goody's, BC's, all herbal medications, fish oil, and all vitamins.                      Do not wear jewelry            Do not wear lotions, powders, colognes, or deodorant.            Men may shave face and neck.            Do not bring valuables to the hospital.            [facility] is not responsible for any belongings or valuables.  Do NOT Smoke (Tobacco/Vaping) or drink Alcohol 24 hours prior to your procedure If you use a CPAP at night, you may bring all equipment for your overnight stay.   Contacts, glasses, dentures or  bridgework may not be worn into surgery.      For patients admitted to the hospital, discharge time will be determined by your treatment team.   Patients discharged the day of surgery will not be allowed to drive home, and someone needs to stay with them for 24 hours.    Special instructions:   [place]- Preparing For Surgery  Before surgery, you can play an important role. Because skin is not sterile, your skin needs to be as free of germs as possible. You can reduce the number of germs on your skin by washing with CHG (chlorahexidine gluconate) Soap before surgery.  CHG is an antiseptic cleaner which kills germs and bonds with the skin to continue killing germs even after washing.    Oral Hygiene is also important to reduce your risk of infection.  Remember - BRUSH YOUR TEETH THE MORNING OF SURGERY WITH YOUR REGULAR TOOTHPASTE  Please do not use if you have an allergy to CHG or antibacterial soaps. If your skin becomes reddened/irritated stop using the CHG.  Do not shave (including legs and underarms) for at least 48 hours prior to first CHG shower. It is OK to shave your face.  Please follow these instructions carefully.   1. Shower the NIGHT BEFORE SURGERY and the MORNING OF SURGERY with CHG Soap.   2. If you chose to wash your hair, wash your hair first as usual with your normal shampoo.  3. After you shampoo, rinse your hair and body thoroughly to remove the shampoo.  4. Use CHG as you would any other liquid soap. You can apply CHG directly to the skin and wash gently with a scrungie or a clean washcloth.   5. Apply the CHG Soap to your body ONLY FROM THE NECK DOWN.  Do not use on open wounds or open sores. Avoid contact with your eyes, ears, mouth and genitals (private parts). Wash Face and genitals (private parts)  with your normal soap.   6. Wash thoroughly, paying special attention to the area where your surgery will be performed.  7. Thoroughly rinse your body with warm  water from the neck down.  8. DO NOT shower/wash with your normal soap after using and rinsing off the CHG Soap.  9. Pat yourself dry with a CLEAN TOWEL.  10. Wear CLEAN PAJAMAS to bed the night before surgery  11. Place CLEAN SHEETS on your bed the night of your first shower and DO NOT SLEEP WITH PETS.   Day of Surgery: Wear Clean/Comfortable clothing the morning of surgery Do not apply any deodorants/lotions.   Remember to brush your teeth WITH YOUR REGULAR TOOTHPASTE.   Please read over the following fact sheets that you were given.

## 2020-07-07 ENCOUNTER — Encounter (HOSPITAL_COMMUNITY): Payer: Self-pay

## 2020-07-07 ENCOUNTER — Other Ambulatory Visit (HOSPITAL_COMMUNITY)
Admission: RE | Admit: 2020-07-07 | Discharge: 2020-07-07 | Disposition: A | Discharge status: Home / Self Care | Payer: Medicare HMO | Source: Ambulatory Visit | Attending: Neurosurgery | Admitting: Neurosurgery

## 2020-07-07 ENCOUNTER — Encounter (HOSPITAL_COMMUNITY)
Admission: RE | Admit: 2020-07-07 | Discharge: 2020-07-07 | Disposition: A | Discharge status: Home / Self Care | Payer: Medicare HMO | Source: Ambulatory Visit | Attending: Neurosurgery | Admitting: Neurosurgery

## 2020-07-07 ENCOUNTER — Other Ambulatory Visit: Payer: Self-pay

## 2020-07-07 DIAGNOSIS — Z20822 Contact with and (suspected) exposure to covid-19: Secondary | ICD-10-CM | POA: Insufficient documentation

## 2020-07-07 DIAGNOSIS — Z01812 Encounter for preprocedural laboratory examination: Secondary | ICD-10-CM | POA: Insufficient documentation

## 2020-07-07 LAB — CBC
HCT: 36 % — ABNORMAL LOW (ref 39.0–52.0)
Hemoglobin: 11.5 g/dL — ABNORMAL LOW (ref 13.0–17.0)
MCH: 31.9 pg (ref 26.0–34.0)
MCHC: 31.9 g/dL (ref 30.0–36.0)
MCV: 100 fL (ref 80.0–100.0)
Platelets: 373 10*3/uL (ref 150–400)
RBC: 3.6 MIL/uL — ABNORMAL LOW (ref 4.22–5.81)
RDW: 13.5 % (ref 11.5–15.5)
WBC: 5.3 10*3/uL (ref 4.0–10.5)
nRBC: 0 % (ref 0.0–0.2)

## 2020-07-07 LAB — SURGICAL PCR SCREEN
MRSA, PCR: NEGATIVE
Staphylococcus aureus: POSITIVE — AB

## 2020-07-07 LAB — BASIC METABOLIC PANEL WITH GFR
Anion gap: 9 (ref 5–15)
BUN: 35 mg/dL — ABNORMAL HIGH (ref 8–23)
CO2: 26 mmol/L (ref 22–32)
Calcium: 9.4 mg/dL (ref 8.9–10.3)
Chloride: 105 mmol/L (ref 98–111)
Creatinine, Ser: 1.81 mg/dL — ABNORMAL HIGH (ref 0.61–1.24)
GFR, Estimated: 37 mL/min — ABNORMAL LOW
Glucose, Bld: 104 mg/dL — ABNORMAL HIGH (ref 70–99)
Potassium: 3.4 mmol/L — ABNORMAL LOW (ref 3.5–5.1)
Sodium: 140 mmol/L (ref 135–145)

## 2020-07-07 LAB — SARS CORONAVIRUS 2 (TAT 6-24 HRS): SARS Coronavirus 2: NEGATIVE

## 2020-07-07 NOTE — Progress Notes (Signed)
PCP - [name], NP Cardiologist - Dr. Johnsie Cancel, cards clearance on 06/28/20 in Epic  PPM/ICD - n/a Device Orders -  Rep Notified -   Chest x-ray - 03/03/2020 EKG - 06/28/2020 Stress Test - 2017 ECHO - 2017 Cardiac Cath - patient denies  Sleep Study - most recent sleep study 04/2019 CPAP - yes  Fasting Blood Sugar - n/a Checks Blood Sugar _____ times a day  Blood Thinner Instructions: n/a Aspirin Instructions: patient stopped aspirin, and fish oil on 07/04/2020  ERAS Protcol - n/a PRE-SURGERY Ensure or G2-   COVID TEST- after PAT appoinment   Anesthesia review: yes, history of cardiac testing  Patient denies shortness of breath, fever, cough and chest pain at PAT appointment   All instructions explained to the patient, with a verbal understanding of the material. Patient agrees to go over the instructions while at home for a better understanding. Patient also instructed to self quarantine after being tested for COVID-19. The opportunity to ask questions was provided.

## 2020-07-08 ENCOUNTER — Encounter: Payer: Self-pay | Admitting: Family Medicine

## 2020-07-08 ENCOUNTER — Ambulatory Visit: Payer: Medicare HMO | Admitting: Family Medicine

## 2020-07-08 VITALS — BP 101/47 | HR 77 | Ht 66.0 in | Wt 152.0 lb

## 2020-07-08 DIAGNOSIS — Z96653 Presence of artificial knee joint, bilateral: Secondary | ICD-10-CM

## 2020-07-08 HISTORY — DX: Presence of artificial knee joint, bilateral: Z96.653

## 2020-07-08 NOTE — Assessment & Plan Note (Signed)
Has some giving way of his right knee.  It is likely related to weakness in his thigh from the changes observed on MRI of his lumbar spine.  No concern for the surgical hardware at the knee. -Counseled on supportive care. -Hinged knee brace. -Rolling walker. -Could consider physical therapy.

## 2020-07-08 NOTE — Progress Notes (Signed)
Anesthesia Chart Review:  Follows with cardiology for hx of  history of HLD, GERD, left bruit , HTN and COPD. Per Dr. Kyla Balzarine note 06/28/20, "No history of CAD with normal myovue in 2008 and may of 2017. Last TTE done June 2017 with EF 55-60% AV sclerosis Duplex done March 2021 with new criteria read as plaque no stenosis bilaterally." Surgical clearance also addressed per note, "Ok to have surgery from cardiac perspective with Dr Saintclair Halsted 07/09/20 due to  Spinal cord compression and arm weakness Co morbidities of CRF, COPD and age biggest issue not cardiac."  OSA on CPAP.  Preop labs reviewed, creatinine elevated 1.81 which appears near baseline.  Mild anemia with hemoglobin 11.5.  EKG 06/28/2020: Sinus rhythm.  Rate 78.  Right bundle branch block.  Left anterior fascicular block.  Frequent PACs.  CT chest 03/03/2020: IMPRESSION: 1. Bandlike areas of atelectasis in the left lung base may correspond to the abnormality seen on comparison radiography. 2. No other acute intrathoracic process. Specifically, no evidence of pneumonia. 3. Few stable, likely postinflammatory, sub 3 mm nodules in the right upper lobe. 4. Unchanged sliding-type hiatal hernia. 5. Aortic Atherosclerosis (ICD10-I70.0) 6. Emphysema (ICD10-J43.9) and chronic bronchitic features. May correlate with smoking history.  Carotid ultrasound 12/09/2019: Summary:  Right Carotid: Velocities in the right ICA are consistent with a 1-39% stenosis.  Left Carotid: Velocities in the left ICA are consistent with a 1-39% stenosis. Non-hemodynamically significant plaque <50% noted in the CCA.  Vertebrals: Bilateral vertebral arteries demonstrate antegrade flow.  Subclavians: Normal flow hemodynamics were seen in bilateral subclavian arteries.    TTE 02/10/2016: - Left ventricle: The cavity size was normal. There was mild  concentric hypertrophy. Systolic function was normal. The  estimated ejection fraction was in the range of 55% to  60%. Wall  motion was normal; there were no regional wall motion  abnormalities. There was an increased relative contribution of  atrial contraction to ventricular filling. Doppler parameters are  consistent with abnormal left ventricular relaxation (grade 1  diastolic dysfunction).  - Aortic valve: Moderately calcified annulus. Trileaflet. Mild  diffuse thickening and calcification.  - Mitral valve: Calcified annulus.  - Left atrium: The atrium was mildly dilated.  - Pulmonary arteries: PA peak pressure: 31 mm Hg (S).  Nuclear stress 01/25/2016:  Nuclear stress EF: 48%.  There was no ST segment deviation noted during stress.  No T wave inversion was noted during stress.  Heart rate 30 bpm after infusion of Lexiscan.  The left ventricular ejection fraction is mildly decreased (45-54%).  This is an intermediate risk study due to reduced systolic function. There was no ischemia.   PFT 12/01/16: FVC-%Pred-Pre Latest Units: % 103  FEV1-%Pred-Pre Latest Units: % 106  TLC % pred Latest Units: % 109  RV % pred Latest Units: % 115  DLCO unc % pred Latest Units: % 244 [name] Pratt    Wynonia Musty [facility] Short Stay Center/Anesthesiology Phone [phone] 07/08/2020 9:06 AM

## 2020-07-08 NOTE — Patient Instructions (Signed)
Good to see you Please try the brace and let us know if it doesn't work  Please try the rolling walker   Please send me a message in [place] with any questions or updates.  Please see me back in 4 weeks or as needed .   --Dr. Raeford Razor

## 2020-07-08 NOTE — Progress Notes (Signed)
[name] Pratt - 79 y.o. male MRN [id number]  Date of birth: [birth year]  SUBJECTIVE:  Including CC & ROS.  Chief Complaint  Patient presents with  . Follow-up    right knee    [name] Pratt is a 79 y.o. male that is presenting with right knee giving out on him at times.  He has a distant history of bilateral total knee arthroplasty.  He feels weak in his legs.  He has changes found on MRI of his lumbar spine..   Review of Systems See HPI   HISTORY: Past Medical, Surgical, Social, and Family History Reviewed & Updated per EMR.   Pertinent Historical Findings include:  Past Medical History:  Diagnosis Date  . Aortic atherosclerosis ([place]) 03/05/2020  . Arthritis   . Cancer ([place])    skin cancer  . Carotid stenosis    a. Carotid U/S 7/62: LICA < 83%, RICA 15-17%;  b.  Carotid U/S 6/16:  RICA 07-37%; LICA 1-06%; f/u 1 year  . Chest pain     Myoview in 2008 was normal.  Echo 7/09: EF 60%, normal wall motion, mild LVH, mild LAE.  [name] Complication of anesthesia    "stomach does not wake up"  . COPD (chronic obstructive pulmonary disease) ([place])   . Emphysema, unspecified ([place]) 03/05/2020  . GERD (gastroesophageal reflux disease)   . Gout   . History of chicken pox   . History of hemorrhoids   . History of kidney stones   . Hyperlipidemia    under control  . Hypertension    under control  . Hypothyroidism   . Ileus ([place])   . Inguinal hernia   . OSA (obstructive sleep apnea)   . Pancreatitis   . PONV (postoperative nausea and vomiting)   . Rosacea   . 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    Past Surgical History:  Procedure Laterality Date  . ABDOMINAL HERNIA REPAIR  07/15/12   Dr [name]  . BROW LIFT  05/07/01  . CYSTOSCOPY WITH URETEROSCOPY AND STENT PLACEMENT Right 01/28/2014   Procedure: CYSTOSCOPY WITH URETEROSCOPY, BASKET RETRIVAL AND  STENT PLACEMENT;  Surgeon: [name], MD;  Location: [facility];  Service:  Urology;  Laterality: Right;  . epidural injections     multiple procedures  . [place]   multiple times  . EYE SURGERY Bilateral 03/25/10, 2012   cataract removal  . HEMORRHOID SURGERY  2014  . HIATAL HERNIA REPAIR  12/21/93  . HOLMIUM LASER APPLICATION Right 2/69/4854   Procedure: HOLMIUM LASER APPLICATION;  Surgeon: [name], MD;  Location: [facility];  Service: Urology;  Laterality: Right;  . INGUINAL HERNIA REPAIR Right 07/15/12   Dr [name], x2  . JOINT REPLACEMENT  07/25/04   right knee  . JOINT REPLACEMENT  07/13/10   left knee  . KNEE SURGERY  1995  . LAPAROSCOPIC CHOLECYSTECTOMY  09/20/06   with intraoperative cholangiogram and right inguinal herniorrhaphy with mesh   . LIGAMENT REPAIR Left 07/2013   shoulder  . LITHOTRIPSY     x2  . NASAL SEPTUM SURGERY  1975  . REFRACTIVE SURGERY Bilateral   . 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 SKIN CANCER DESTRUCTION     nose, ear  . TONSILLECTOMY  as child  . TOTAL HIP ARTHROPLASTY Left   . URETHRAL DILATION  1991   [name]    Family History  Problem Relation Age of Onset  . Cancer Mother   . Hypertension Other   . Cancer Other   . Osteoarthritis Other     Social History   Socioeconomic History  . Marital status: Married    Spouse name: Not on file  . Number of children: 1  . Years of education: Not on file  . Highest education level: Not on file  Occupational History  . 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 Smoking status: Former Smoker    Years: 11.00    Types: Cigarettes    Quit date: 09/11/1978    Years since quitting: 41.8  . Smokeless tobacco: Never Used  Vaping Use  . Vaping Use: Never used  Substance and Sexual Activity  . Alcohol use: Yes    Alcohol/week: 0.0 standard drinks    Comment: rare  . Drug use: No  . Sexual activity: Not on file  Other Topics Concern  . Not on file  Social History Narrative   Lives with his wife   He has  one son- lives locally.   2 grandchildren   Has worked for Research officer, trade union   Enjoys wood working   Completed HS.  [employer]   Social Determinants of Radio broadcast assistant Strain:   . Difficulty of Paying Living Expenses: Not on file  Food Insecurity:   . Worried About Charity fundraiser in the Last Year: Not on file  . Ran Out of Food in the Last Year: Not on file  Transportation Needs:   . Lack of Transportation (Medical): Not on file  . Lack of Transportation (Non-Medical): Not on file  Physical Activity:   . Days of Exercise per Week: Not on file  . Minutes of Exercise per Session: Not on file  Stress:   . Feeling of Stress : Not on file  Social Connections:   . Frequency of Communication with Friends and Family: Not on file  . Frequency of Social Gatherings with Friends and Family: Not on file  . Attends Religious Services: Not on file  . Active Member of Clubs or Organizations: Not on file  . Attends Archivist Meetings: Not on file  . Marital Status: Not on file  Intimate Partner Violence:   . Fear of Current or Ex-Partner: Not on file  . Emotionally Abused: Not on file  . Physically Abused: Not on file  . Sexually Abused: Not on file     PHYSICAL EXAM:  VS: BP (!) 101/47   Pulse 77   Ht 5\' 6"  (1.676 m)   Wt 152 lb (68.9 kg)   BMI 24.53 kg/m  Physical Exam Gen: NAD, alert, cooperative with exam, well-appearing MSK:  Right knee: No swelling or ecchymosis. Normal range of motion. Stable surgical components. Neurovascularly intact     ASSESSMENT & PLAN:   History of total bilateral knee replacement Has some giving way of his right knee.  It is likely related to weakness in his thigh from the changes observed on MRI of his lumbar spine.  No concern for the surgical hardware at the knee. -Counseled on supportive care. -Hinged knee brace. -Rolling walker. -Could consider physical therapy.

## 2020-07-08 NOTE — Anesthesia Preprocedure Evaluation (Addendum)
Anesthesia Evaluation  Patient identified by MRN, date of birth, ID band Patient awake    Reviewed: Allergy & Precautions, NPO status , Patient's Chart, lab work & pertinent test results  History of Anesthesia Complications (+) PONV and history of anesthetic complications  Airway Mallampati: II  TM Distance: <3 FB Neck ROM: Full    Dental no notable dental hx. (+) Dental Advisory Given   Pulmonary shortness of breath, sleep apnea , COPD, former smoker,    Pulmonary exam normal breath sounds clear to auscultation       Cardiovascular hypertension, Pt. on medications Normal cardiovascular exam Rhythm:Regular Rate:Normal     Neuro/Psych    GI/Hepatic Neg liver ROS, GERD  ,  Endo/Other  Hypothyroidism   Renal/GU Renal disease     Musculoskeletal  (+) Arthritis ,   Abdominal   Peds  Hematology negative hematology ROS (+)   Anesthesia Other Findings   Reproductive/Obstetrics                                                           Anesthesia Evaluation  Patient identified by MRN, date of birth, ID band Patient awake    Reviewed: Allergy & Precautions, H&P , NPO status , Patient's Chart, lab work & pertinent test results, reviewed documented beta blocker date and time   History of Anesthesia Complications (+) PONV  Airway Mallampati: III TM Distance: >3 FB Neck ROM: full    Dental no notable dental hx. (+) Teeth Intact, Dental Advisory Given   Pulmonary shortness of breath and with exertion, sleep apnea and Continuous Positive Airway Pressure Ventilation , COPDformer smoker,  breath sounds clear to auscultation  Pulmonary exam normal       Cardiovascular hypertension, Pt. on medications Rhythm:regular Rate:Normal     Neuro/Psych Carotid stenosis negative psych ROS   GI/Hepatic negative GI ROS, Neg liver ROS, GERD-  Medicated and Controlled,pancreatitis    Endo/Other  negative endocrine ROSHypothyroidism   Renal/GU negative Renal ROS  negative genitourinary   Musculoskeletal   Abdominal   Peds  Hematology negative hematology ROS (+)   Anesthesia Other Findings   Reproductive/Obstetrics negative OB ROS                          Anesthesia Physical Anesthesia Plan  ASA: III  Anesthesia Plan: General   Post-op Pain Management:    Induction: Intravenous  Airway Management Planned: LMA  Additional Equipment:   Intra-op Plan:   Post-operative Plan:   Informed Consent: I have reviewed the patients History and Physical, chart, labs and discussed the procedure including the risks, benefits and alternatives for the proposed anesthesia with the patient or authorized representative who has indicated his/her understanding and acceptance.   Dental Advisory Given  Plan Discussed with: CRNA and Surgeon  Anesthesia Plan Comments:         Anesthesia Quick Evaluation  Anesthesia Physical Anesthesia Plan  ASA: III  Anesthesia Plan: General   Post-op Pain Management:    Induction: Intravenous  PONV Risk Score and Plan: 4 or greater and Ondansetron, Dexamethasone and Treatment may vary due to age or medical condition  Airway Management Planned: Oral ETT  Additional Equipment: None  Intra-op Plan:   Post-operative Plan: Extubation in OR  Informed Consent: I  have reviewed the patients History and Physical, chart, labs and discussed the procedure including the risks, benefits and alternatives for the proposed anesthesia with the patient or authorized representative who has indicated his/her understanding and acceptance.     Dental advisory given  Plan Discussed with: CRNA  Anesthesia Plan Comments: (PAT note by Karoline Caldwell, PA-C: Follows with cardiology for hx of  history of HLD, GERD, left bruit , HTN and COPD. Per Dr. Kyla Balzarine note 06/28/20, "No history of CAD with normal myovue in 2008  and may of 2017. Last TTE done June 2017 with EF 55-60% AV sclerosis Duplex done March 2021 with new criteria read as plaque no stenosis bilaterally." Surgical clearance also addressed per note, "Ok to have surgery from cardiac perspective with Dr Saintclair Halsted 07/09/20 due to  Spinal cord compression and arm weakness Co morbidities of CRF, COPD and age biggest issue not cardiac."  OSA on CPAP.  Preop labs reviewed, creatinine elevated 1.81 which appears near baseline.  Mild anemia with hemoglobin 11.5.  EKG 06/28/2020: Sinus rhythm.  Rate 78.  Right bundle branch block.  Left anterior fascicular block.  Frequent PACs.  CT chest 03/03/2020: IMPRESSION: 1. Bandlike areas of atelectasis in the left lung base may correspond to the abnormality seen on comparison radiography. 2. No other acute intrathoracic process. Specifically, no evidence of pneumonia. 3. Few stable, likely postinflammatory, sub 3 mm nodules in the right upper lobe. 4. Unchanged sliding-type hiatal hernia. 5. Aortic Atherosclerosis (ICD10-I70.0) 6. Emphysema (ICD10-J43.9) and chronic bronchitic features. May correlate with smoking history.  Carotid ultrasound 12/09/2019: Summary:  Right Carotid: Velocities in the right ICA are consistent with a 1-39% stenosis.  Left Carotid: Velocities in the left ICA are consistent with a 1-39% stenosis. Non-hemodynamically significant plaque <50% noted in the CCA.  Vertebrals: Bilateral vertebral arteries demonstrate antegrade flow.  Subclavians: Normal flow hemodynamics were seen in bilateral subclavian arteries.    TTE 02/10/2016: - Left ventricle: The cavity size was normal. There was mild  concentric hypertrophy. Systolic function was normal. The  estimated ejection fraction was in the range of 55% to 60%. Wall  motion was normal; there were no regional wall motion  abnormalities. There was an increased relative contribution of  atrial contraction to ventricular filling. Doppler  parameters are  consistent with abnormal left ventricular relaxation (grade 1  diastolic dysfunction).  - Aortic valve: Moderately calcified annulus. Trileaflet. Mild  diffuse thickening and calcification.  - Mitral valve: Calcified annulus.  - Left atrium: The atrium was mildly dilated.  - Pulmonary arteries: PA peak pressure: 31 mm Hg (S).  Nuclear stress 01/25/2016: Nuclear stress EF: 48%. There was no ST segment deviation noted during stress. No T wave inversion was noted during stress. Heart rate 30 bpm after infusion of Lexiscan. The left ventricular ejection fraction is mildly decreased (45-54%). This is an intermediate risk study due to reduced systolic function. There was no ischemia.   PFT 12/01/16: FVC-%Pred-Pre Latest Units: % 103 FEV1-%Pred-Pre Latest Units: % 106 TLC % pred Latest Units: % 109 RV % pred Latest Units: % 115 DLCO unc % pred Latest Units: % 93 )      Anesthesia Quick Evaluation

## 2020-07-09 ENCOUNTER — Ambulatory Visit (HOSPITAL_COMMUNITY): Payer: Medicare HMO

## 2020-07-09 ENCOUNTER — Encounter (HOSPITAL_COMMUNITY): Payer: Self-pay | Admitting: Neurosurgery

## 2020-07-09 ENCOUNTER — Ambulatory Visit (HOSPITAL_COMMUNITY): Payer: Medicare HMO | Admitting: Certified Registered Nurse Anesthetist

## 2020-07-09 ENCOUNTER — Encounter (HOSPITAL_COMMUNITY)
Admission: RE | Disposition: A | Discharge status: Home / Self Care | Payer: Self-pay | Source: Home / Self Care | Attending: Neurosurgery

## 2020-07-09 ENCOUNTER — Ambulatory Visit (HOSPITAL_COMMUNITY): Payer: Medicare HMO | Admitting: Physician Assistant

## 2020-07-09 ENCOUNTER — Observation Stay (HOSPITAL_COMMUNITY)
Admission: RE | Admit: 2020-07-09 | Discharge: 2020-07-10 | Disposition: A | Discharge status: Home / Self Care | Payer: Medicare HMO | Attending: Neurosurgery | Admitting: Neurosurgery

## 2020-07-09 ENCOUNTER — Other Ambulatory Visit: Payer: Self-pay

## 2020-07-09 DIAGNOSIS — Z87891 Personal history of nicotine dependence: Secondary | ICD-10-CM | POA: Diagnosis not present

## 2020-07-09 DIAGNOSIS — E7800 Pure hypercholesterolemia, unspecified: Secondary | ICD-10-CM | POA: Diagnosis not present

## 2020-07-09 DIAGNOSIS — Z981 Arthrodesis status: Secondary | ICD-10-CM

## 2020-07-09 DIAGNOSIS — R202 Paresthesia of skin: Secondary | ICD-10-CM | POA: Diagnosis present

## 2020-07-09 DIAGNOSIS — E039 Hypothyroidism, unspecified: Secondary | ICD-10-CM | POA: Insufficient documentation

## 2020-07-09 DIAGNOSIS — Z85828 Personal history of other malignant neoplasm of skin: Secondary | ICD-10-CM | POA: Diagnosis not present

## 2020-07-09 DIAGNOSIS — M4802 Spinal stenosis, cervical region: Principal | ICD-10-CM | POA: Insufficient documentation

## 2020-07-09 DIAGNOSIS — Z96641 Presence of right artificial hip joint: Secondary | ICD-10-CM | POA: Insufficient documentation

## 2020-07-09 DIAGNOSIS — Z7982 Long term (current) use of aspirin: Secondary | ICD-10-CM | POA: Diagnosis not present

## 2020-07-09 DIAGNOSIS — Z79899 Other long term (current) drug therapy: Secondary | ICD-10-CM | POA: Insufficient documentation

## 2020-07-09 DIAGNOSIS — I10 Essential (primary) hypertension: Secondary | ICD-10-CM | POA: Diagnosis not present

## 2020-07-09 DIAGNOSIS — J449 Chronic obstructive pulmonary disease, unspecified: Secondary | ICD-10-CM | POA: Insufficient documentation

## 2020-07-09 DIAGNOSIS — M5001 Cervical disc disorder with myelopathy,  high cervical region: Secondary | ICD-10-CM | POA: Insufficient documentation

## 2020-07-09 DIAGNOSIS — M4712 Other spondylosis with myelopathy, cervical region: Secondary | ICD-10-CM | POA: Diagnosis not present

## 2020-07-09 DIAGNOSIS — M5412 Radiculopathy, cervical region: Secondary | ICD-10-CM | POA: Diagnosis not present

## 2020-07-09 DIAGNOSIS — Z419 Encounter for procedure for purposes other than remedying health state, unspecified: Secondary | ICD-10-CM

## 2020-07-09 DIAGNOSIS — Z96653 Presence of artificial knee joint, bilateral: Secondary | ICD-10-CM | POA: Insufficient documentation

## 2020-07-09 HISTORY — DX: Arthrodesis status: Z98.1

## 2020-07-09 HISTORY — PX: ANTERIOR CERVICAL DECOMP/DISCECTOMY FUSION: SHX1161

## 2020-07-09 SURGERY — ANTERIOR CERVICAL DECOMPRESSION/DISCECTOMY FUSION 1 LEVEL
Anesthesia: General | Site: Spine Cervical

## 2020-07-09 MED ORDER — LIDOCAINE 2% (20 MG/ML) 5 ML SYRINGE
INTRAMUSCULAR | Status: DC | PRN
  Administered 2020-07-09: 100 mg via INTRAVENOUS

## 2020-07-09 MED ORDER — ONDANSETRON HCL 4 MG PO TABS: 4.0000 mg | ORAL_TABLET | Freq: Four times a day (QID) | ORAL | Status: DC | PRN

## 2020-07-09 MED ORDER — CEFAZOLIN SODIUM-DEXTROSE 2-4 GM/100ML-% IV SOLN
2.0000 g | INTRAVENOUS | Status: AC
  Administered 2020-07-09: 2 g via INTRAVENOUS
  Filled 2020-07-09: qty 100

## 2020-07-09 MED ORDER — CYCLOSPORINE 0.05 % OP EMUL
1.0000 [drp] | Freq: Two times a day (BID) | OPHTHALMIC | Status: DC
  Administered 2020-07-09 – 2020-07-10 (×2): 1 [drp] via OPHTHALMIC
  Filled 2020-07-09 (×2): qty 1

## 2020-07-09 MED ORDER — MIRABEGRON ER 25 MG PO TB24
25.0000 mg | ORAL_TABLET | Freq: Every day | ORAL | Status: DC
  Administered 2020-07-10: 25 mg via ORAL
  Filled 2020-07-09: qty 1

## 2020-07-09 MED ORDER — EPHEDRINE SULFATE-NACL 50-0.9 MG/10ML-% IV SOSY
PREFILLED_SYRINGE | INTRAVENOUS | Status: DC | PRN
  Administered 2020-07-09: 10 mg via INTRAVENOUS
  Administered 2020-07-09: 5 mg via INTRAVENOUS

## 2020-07-09 MED ORDER — FENOFIBRATE 160 MG PO TABS
160.0000 mg | ORAL_TABLET | Freq: Every day | ORAL | Status: DC
  Administered 2020-07-10: 160 mg via ORAL
  Filled 2020-07-09: qty 1

## 2020-07-09 MED ORDER — EPHEDRINE 5 MG/ML INJ
INTRAVENOUS | Status: AC
  Filled 2020-07-09: qty 10

## 2020-07-09 MED ORDER — PHENYLEPHRINE 40 MCG/ML (10ML) SYRINGE FOR IV PUSH (FOR BLOOD PRESSURE SUPPORT)
PREFILLED_SYRINGE | INTRAVENOUS | Status: DC | PRN
  Administered 2020-07-09 (×4): 40 ug via INTRAVENOUS

## 2020-07-09 MED ORDER — SUGAMMADEX SODIUM 200 MG/2ML IV SOLN
INTRAVENOUS | Status: DC | PRN
  Administered 2020-07-09: 200 mg via INTRAVENOUS

## 2020-07-09 MED ORDER — ONDANSETRON HCL 4 MG/2ML IJ SOLN
INTRAMUSCULAR | Status: AC
  Filled 2020-07-09: qty 2

## 2020-07-09 MED ORDER — DOCUSATE SODIUM 100 MG PO CAPS: 100.0000 mg | ORAL_CAPSULE | Freq: Two times a day (BID) | ORAL | Status: DC | PRN

## 2020-07-09 MED ORDER — ROCURONIUM BROMIDE 10 MG/ML (PF) SYRINGE
PREFILLED_SYRINGE | INTRAVENOUS | Status: DC | PRN
  Administered 2020-07-09: 10 mg via INTRAVENOUS
  Administered 2020-07-09: 50 mg via INTRAVENOUS
  Administered 2020-07-09: 20 mg via INTRAVENOUS

## 2020-07-09 MED ORDER — CHLORHEXIDINE GLUCONATE CLOTH 2 % EX PADS: 6.0000 | MEDICATED_PAD | Freq: Once | CUTANEOUS | Status: DC

## 2020-07-09 MED ORDER — LIDOCAINE 2% (20 MG/ML) 5 ML SYRINGE
INTRAMUSCULAR | Status: AC
  Filled 2020-07-09: qty 5

## 2020-07-09 MED ORDER — HYDROMORPHONE HCL 1 MG/ML IJ SOLN: 0.2500 mg | INTRAMUSCULAR | Status: DC | PRN

## 2020-07-09 MED ORDER — SODIUM CHLORIDE 0.9% FLUSH
3.0000 mL | Freq: Two times a day (BID) | INTRAVENOUS | Status: DC
  Administered 2020-07-09: 3 mL via INTRAVENOUS

## 2020-07-09 MED ORDER — THROMBIN 5000 UNITS EX SOLR
OROMUCOSAL | Status: DC | PRN
  Administered 2020-07-09: 5 mL via TOPICAL

## 2020-07-09 MED ORDER — FENTANYL CITRATE (PF) 250 MCG/5ML IJ SOLN
INTRAMUSCULAR | Status: DC | PRN
  Administered 2020-07-09 (×2): 50 ug via INTRAVENOUS
  Administered 2020-07-09: 100 ug via INTRAVENOUS

## 2020-07-09 MED ORDER — PROPOFOL 10 MG/ML IV BOLUS
INTRAVENOUS | Status: AC
  Filled 2020-07-09: qty 20

## 2020-07-09 MED ORDER — PROPOFOL 10 MG/ML IV BOLUS
INTRAVENOUS | Status: DC | PRN
  Administered 2020-07-09: 100 mg via INTRAVENOUS

## 2020-07-09 MED ORDER — THROMBIN 5000 UNITS EX SOLR
CUTANEOUS | Status: AC
  Filled 2020-07-09: qty 15000

## 2020-07-09 MED ORDER — CHLORHEXIDINE GLUCONATE 0.12 % MT SOLN
15.0000 mL | Freq: Once | OROMUCOSAL | Status: AC
  Administered 2020-07-09: 15 mL via OROMUCOSAL
  Filled 2020-07-09: qty 15

## 2020-07-09 MED ORDER — VITAMIN D 25 MCG (1000 UNIT) PO TABS
1000.0000 [IU] | ORAL_TABLET | Freq: Every day | ORAL | Status: DC
  Administered 2020-07-09 – 2020-07-10 (×2): 1000 [IU] via ORAL
  Filled 2020-07-09 (×2): qty 1

## 2020-07-09 MED ORDER — SODIUM CHLORIDE 0.9 % IV SOLN: 250.0000 mL | INTRAVENOUS | Status: DC

## 2020-07-09 MED ORDER — PHENOL 1.4 % MT LIQD: 1.0000 | OROMUCOSAL | Status: DC | PRN

## 2020-07-09 MED ORDER — THROMBIN 5000 UNITS EX SOLR
CUTANEOUS | Status: DC | PRN
  Administered 2020-07-09 (×2): 5000 [IU] via TOPICAL

## 2020-07-09 MED ORDER — SOOTHE XP OP SOLN: Freq: Every day | OPHTHALMIC | Status: DC

## 2020-07-09 MED ORDER — OXYCODONE HCL 5 MG PO TABS
10.0000 mg | ORAL_TABLET | ORAL | Status: DC | PRN
  Administered 2020-07-09: 10 mg via ORAL
  Filled 2020-07-09: qty 2

## 2020-07-09 MED ORDER — SODIUM CHLORIDE 0.9% FLUSH: 3.0000 mL | INTRAVENOUS | Status: DC | PRN

## 2020-07-09 MED ORDER — ADULT MULTIVITAMIN W/MINERALS CH
1.0000 | ORAL_TABLET | Freq: Every day | ORAL | Status: DC
  Administered 2020-07-09: 1 via ORAL
  Filled 2020-07-09: qty 1

## 2020-07-09 MED ORDER — ONDANSETRON HCL 4 MG/2ML IJ SOLN: 4.0000 mg | Freq: Once | INTRAMUSCULAR | Status: DC | PRN

## 2020-07-09 MED ORDER — DEXAMETHASONE SODIUM PHOSPHATE 10 MG/ML IJ SOLN
10.0000 mg | Freq: Once | INTRAMUSCULAR | Status: AC
  Administered 2020-07-09: 10 mg via INTRAVENOUS

## 2020-07-09 MED ORDER — HYDROMORPHONE HCL 1 MG/ML IJ SOLN: 1.0000 mg | INTRAMUSCULAR | Status: DC | PRN

## 2020-07-09 MED ORDER — ALBUTEROL SULFATE (2.5 MG/3ML) 0.083% IN NEBU: 2.5000 mg | INHALATION_SOLUTION | Freq: Four times a day (QID) | RESPIRATORY_TRACT | Status: DC | PRN

## 2020-07-09 MED ORDER — GABAPENTIN 300 MG PO CAPS: 300.0000 mg | ORAL_CAPSULE | Freq: Two times a day (BID) | ORAL | Status: DC | PRN

## 2020-07-09 MED ORDER — ACETAMINOPHEN ER 650 MG PO TBCR: 1300.0000 mg | EXTENDED_RELEASE_TABLET | Freq: Three times a day (TID) | ORAL | Status: DC | PRN

## 2020-07-09 MED ORDER — 0.9 % SODIUM CHLORIDE (POUR BTL) OPTIME
TOPICAL | Status: DC | PRN
  Administered 2020-07-09 (×2): 1000 mL

## 2020-07-09 MED ORDER — METHOCARBAMOL 1000 MG/10ML IJ SOLN
500.0000 mg | Freq: Four times a day (QID) | INTRAVENOUS | Status: DC | PRN
  Filled 2020-07-09: qty 5

## 2020-07-09 MED ORDER — ORAL CARE MOUTH RINSE: 15.0000 mL | Freq: Once | OROMUCOSAL | Status: AC

## 2020-07-09 MED ORDER — ACETAMINOPHEN 650 MG RE SUPP: 650.0000 mg | RECTAL | Status: DC | PRN

## 2020-07-09 MED ORDER — MENTHOL 3 MG MT LOZG: 1.0000 | LOZENGE | OROMUCOSAL | Status: DC | PRN

## 2020-07-09 MED ORDER — ACETAMINOPHEN 325 MG PO TABS: 650.0000 mg | ORAL_TABLET | ORAL | Status: DC | PRN

## 2020-07-09 MED ORDER — FERROUS SULFATE 325 (65 FE) MG PO TABS
325.0000 mg | ORAL_TABLET | Freq: Every day | ORAL | Status: DC
  Administered 2020-07-10: 325 mg via ORAL
  Filled 2020-07-09: qty 1

## 2020-07-09 MED ORDER — ROSUVASTATIN CALCIUM 5 MG PO TABS
5.0000 mg | ORAL_TABLET | Freq: Every day | ORAL | Status: DC
  Administered 2020-07-09 – 2020-07-10 (×2): 5 mg via ORAL
  Filled 2020-07-09 (×2): qty 1

## 2020-07-09 MED ORDER — ALLOPURINOL 100 MG PO TABS
100.0000 mg | ORAL_TABLET | Freq: Two times a day (BID) | ORAL | Status: DC
  Administered 2020-07-10: 100 mg via ORAL
  Filled 2020-07-09: qty 1

## 2020-07-09 MED ORDER — FENTANYL CITRATE (PF) 250 MCG/5ML IJ SOLN
INTRAMUSCULAR | Status: AC
  Filled 2020-07-09: qty 5

## 2020-07-09 MED ORDER — METHOCARBAMOL 500 MG PO TABS
500.0000 mg | ORAL_TABLET | Freq: Four times a day (QID) | ORAL | Status: DC | PRN
  Administered 2020-07-09: 500 mg via ORAL
  Filled 2020-07-09: qty 1

## 2020-07-09 MED ORDER — LOSARTAN POTASSIUM 50 MG PO TABS
100.0000 mg | ORAL_TABLET | Freq: Every day | ORAL | Status: DC
  Administered 2020-07-10: 100 mg via ORAL
  Filled 2020-07-09: qty 2

## 2020-07-09 MED ORDER — DEXAMETHASONE SODIUM PHOSPHATE 10 MG/ML IJ SOLN
INTRAMUSCULAR | Status: AC
  Filled 2020-07-09: qty 1

## 2020-07-09 MED ORDER — HYDROCODONE-ACETAMINOPHEN 5-325 MG PO TABS
1.0000 | ORAL_TABLET | ORAL | Status: DC | PRN
  Administered 2020-07-09 – 2020-07-10 (×2): 1 via ORAL
  Filled 2020-07-09 (×2): qty 1

## 2020-07-09 MED ORDER — ONDANSETRON HCL 4 MG/2ML IJ SOLN
INTRAMUSCULAR | Status: DC | PRN
  Administered 2020-07-09: 4 mg via INTRAVENOUS

## 2020-07-09 MED ORDER — ROCURONIUM BROMIDE 10 MG/ML (PF) SYRINGE
PREFILLED_SYRINGE | INTRAVENOUS | Status: AC
  Filled 2020-07-09: qty 10

## 2020-07-09 MED ORDER — ONDANSETRON HCL 4 MG/2ML IJ SOLN: 4.0000 mg | Freq: Four times a day (QID) | INTRAMUSCULAR | Status: DC | PRN

## 2020-07-09 MED ORDER — GLYCOPYRROLATE PF 0.2 MG/ML IJ SOSY
PREFILLED_SYRINGE | INTRAMUSCULAR | Status: DC | PRN
  Administered 2020-07-09 (×2): .1 mg via INTRAVENOUS
  Administered 2020-07-09: .2 mg via INTRAVENOUS

## 2020-07-09 MED ORDER — LEVOTHYROXINE SODIUM 75 MCG PO TABS
150.0000 ug | ORAL_TABLET | ORAL | Status: DC
  Administered 2020-07-10: 150 ug via ORAL
  Filled 2020-07-09: qty 2

## 2020-07-09 MED ORDER — CEFAZOLIN SODIUM-DEXTROSE 2-4 GM/100ML-% IV SOLN
2.0000 g | Freq: Three times a day (TID) | INTRAVENOUS | Status: AC
  Administered 2020-07-09: 2 g via INTRAVENOUS
  Filled 2020-07-09: qty 100

## 2020-07-09 MED ORDER — TAMSULOSIN HCL 0.4 MG PO CAPS
0.4000 mg | ORAL_CAPSULE | Freq: Every day | ORAL | Status: DC
  Administered 2020-07-09 – 2020-07-10 (×2): 0.4 mg via ORAL
  Filled 2020-07-09 (×2): qty 1

## 2020-07-09 MED ORDER — LACTATED RINGERS IV SOLN: INTRAVENOUS | Status: DC

## 2020-07-09 MED ORDER — PHENYLEPHRINE 40 MCG/ML (10ML) SYRINGE FOR IV PUSH (FOR BLOOD PRESSURE SUPPORT)
PREFILLED_SYRINGE | INTRAVENOUS | Status: AC
  Filled 2020-07-09: qty 10

## 2020-07-09 MED ORDER — HEMOSTATIC AGENTS (NO CHARGE) OPTIME
TOPICAL | Status: DC | PRN
  Administered 2020-07-09: 1 via TOPICAL

## 2020-07-09 MED ORDER — ALBUTEROL SULFATE HFA 108 (90 BASE) MCG/ACT IN AERS: 2.0000 | INHALATION_SPRAY | Freq: Four times a day (QID) | RESPIRATORY_TRACT | Status: DC | PRN

## 2020-07-09 SURGICAL SUPPLY — 65 items
BAND RUBBER #18 3X1/16 STRL (MISCELLANEOUS) ×6 IMPLANT
BASKET BONE COLLECTION (BASKET) ×3 IMPLANT
BENZOIN TINCTURE PRP APPL 2/3 (GAUZE/BANDAGES/DRESSINGS) ×3 IMPLANT
BIT DRILL NEURO 2X3.1 SFT TUCH (MISCELLANEOUS) ×1 IMPLANT
BONE VIVIGEN FORMABLE 1.3CC (Bone Implant) ×3 IMPLANT
BUR MATCHSTICK NEURO 3.0 LAGG (BURR) ×3 IMPLANT
CANISTER SUCT 3000ML PPV (MISCELLANEOUS) ×3 IMPLANT
CARTRIDGE OIL MAESTRO DRILL (MISCELLANEOUS) ×1 IMPLANT
CLOSURE WOUND 1/2 X4 (GAUZE/BANDAGES/DRESSINGS) ×1
COVER WAND RF STERILE (DRAPES) IMPLANT
DERMABOND ADVANCED (GAUZE/BANDAGES/DRESSINGS) ×2
DERMABOND ADVANCED .7 DNX12 (GAUZE/BANDAGES/DRESSINGS) ×1 IMPLANT
DIFFUSER DRILL AIR PNEUMATIC (MISCELLANEOUS) ×3 IMPLANT
DRAPE C-ARM 42X72 X-RAY (DRAPES) ×6 IMPLANT
DRAPE LAPAROTOMY 100X72 PEDS (DRAPES) ×3 IMPLANT
DRAPE MICROSCOPE LEICA (MISCELLANEOUS) ×3 IMPLANT
DRILL NEURO 2X3.1 SOFT TOUCH (MISCELLANEOUS) ×3
DRSG OPSITE POSTOP 4X6 (GAUZE/BANDAGES/DRESSINGS) ×3 IMPLANT
DURAPREP 6ML APPLICATOR 50/CS (WOUND CARE) ×3 IMPLANT
ELECT COATED BLADE 2.86 ST (ELECTRODE) ×3 IMPLANT
ELECT REM PT RETURN 9FT ADLT (ELECTROSURGICAL) ×3
ELECTRODE REM PT RTRN 9FT ADLT (ELECTROSURGICAL) ×1 IMPLANT
GAUZE 4X4 16PLY RFD (DISPOSABLE) IMPLANT
GAUZE SPONGE 4X4 12PLY STRL (GAUZE/BANDAGES/DRESSINGS) IMPLANT
GLOVE BIO SURGEON STRL SZ7 (GLOVE) ×3 IMPLANT
GLOVE BIO SURGEON STRL SZ8 (GLOVE) ×3 IMPLANT
GLOVE BIOGEL PI IND STRL 6.5 (GLOVE) ×2 IMPLANT
GLOVE BIOGEL PI IND STRL 7.0 (GLOVE) ×1 IMPLANT
GLOVE BIOGEL PI IND STRL 7.5 (GLOVE) ×1 IMPLANT
GLOVE BIOGEL PI INDICATOR 6.5 (GLOVE) ×4
GLOVE BIOGEL PI INDICATOR 7.0 (GLOVE) ×2
GLOVE BIOGEL PI INDICATOR 7.5 (GLOVE) ×2
GLOVE EXAM NITRILE XL STR (GLOVE) IMPLANT
GLOVE INDICATOR 8.5 STRL (GLOVE) ×3 IMPLANT
GLOVE SS BIOGEL STRL SZ 7 (GLOVE) ×1 IMPLANT
GLOVE SUPERSENSE BIOGEL SZ 7 (GLOVE) ×2
GLOVE SURG SS PI 6.0 STRL IVOR (GLOVE) ×6 IMPLANT
GOWN STRL REUS W/ TWL LRG LVL3 (GOWN DISPOSABLE) ×4 IMPLANT
GOWN STRL REUS W/ TWL XL LVL3 (GOWN DISPOSABLE) ×1 IMPLANT
GOWN STRL REUS W/TWL 2XL LVL3 (GOWN DISPOSABLE) IMPLANT
GOWN STRL REUS W/TWL LRG LVL3 (GOWN DISPOSABLE) ×12
GOWN STRL REUS W/TWL XL LVL3 (GOWN DISPOSABLE) ×3
HALTER HD/CHIN CERV TRACTION D (MISCELLANEOUS) ×3 IMPLANT
HEMOSTAT POWDER KIT SURGIFOAM (HEMOSTASIS) ×3 IMPLANT
KIT BASIN OR (CUSTOM PROCEDURE TRAY) ×3 IMPLANT
KIT TURNOVER KIT B (KITS) ×3 IMPLANT
NEEDLE HYPO 18GX1.5 BLUNT FILL (NEEDLE) ×3 IMPLANT
NEEDLE SPNL 20GX3.5 QUINCKE YW (NEEDLE) ×3 IMPLANT
NS IRRIG 1000ML POUR BTL (IV SOLUTION) ×6 IMPLANT
OIL CARTRIDGE MAESTRO DRILL (MISCELLANEOUS) ×3
PACK LAMINECTOMY NEURO (CUSTOM PROCEDURE TRAY) ×3 IMPLANT
PAD ARMBOARD 7.5X6 YLW CONV (MISCELLANEOUS) ×9 IMPLANT
PIN DISTRACTION 14MM (PIN) ×6 IMPLANT
PLATE ANT CERV XTEND ELD 1 L14 (Plate) ×3 IMPLANT
SCREW VAR 4.2 XD SELF DRILL 14 (Screw) ×12 IMPLANT
SPACER HEDRON C 12X14X6 0D (Spacer) ×3 IMPLANT
SPONGE INTESTINAL PEANUT (DISPOSABLE) ×3 IMPLANT
SPONGE SURGIFOAM ABS GEL SZ50 (HEMOSTASIS) ×3 IMPLANT
STRIP CLOSURE SKIN 1/2X4 (GAUZE/BANDAGES/DRESSINGS) ×2 IMPLANT
SUT VIC AB 3-0 SH 8-18 (SUTURE) ×3 IMPLANT
SUT VICRYL 4-0 PS2 18IN ABS (SUTURE) ×3 IMPLANT
TAPE CLOTH 4X10 WHT NS (GAUZE/BANDAGES/DRESSINGS) IMPLANT
TOWEL GREEN STERILE (TOWEL DISPOSABLE) ×3 IMPLANT
TOWEL GREEN STERILE FF (TOWEL DISPOSABLE) ×3 IMPLANT
WATER STERILE IRR 1000ML POUR (IV SOLUTION) ×3 IMPLANT

## 2020-07-09 NOTE — H&P (Signed)
[name] Pratt is an 79 y.o. male.   Chief Complaint: Numbness tingling weakness in his hands difficulty walking and balance difficulty HPI: 79 year old gentleman is a progressive worsening difficulties with his balance in addition to loss of strength and loss of sensation in his hands and difficulty opening jars and buttoning close.  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  Due to patient's progression of clinical syndrome imaging findings and failed conservative treatment I recommended anterior cervical discectomy and fusion at that level.  I extensively gone over the risks and benefits of that operation with him as well as perioperative course expectations of outcome and alternatives of surgery and he understands and agrees to proceed forward.  Past Medical History:  Diagnosis Date  . Aortic atherosclerosis ([place]) 03/05/2020  . Arthritis   . Cancer ([place])    skin cancer  . Carotid stenosis    a. Carotid U/S 1/44: LICA < 81%, RICA 85-63%;  b.  Carotid U/S 1/49:  RICA 70-26%; LICA 3-78%; f/u 1 year  . Chest pain     Myoview in 2008 was normal.  Echo 7/09: EF 60%, normal wall motion, mild LVH, mild LAE.  [name] Complication of anesthesia    "stomach does not wake up"  . COPD (chronic obstructive pulmonary disease) ([place])   . Emphysema, unspecified ([place]) 03/05/2020  . GERD (gastroesophageal reflux disease)   . Gout   . History of chicken pox   . History of hemorrhoids   . History of kidney stones   . Hyperlipidemia    under control  . Hypertension    under control  . Hypothyroidism   . Ileus ([place])   . Inguinal hernia   . OSA (obstructive sleep apnea)   . Pancreatitis   . PONV (postoperative nausea and vomiting)   . Rosacea   . 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    Past Surgical History:  Procedure Laterality Date  . ABDOMINAL HERNIA REPAIR  07/15/12   Dr [name]  . BROW LIFT  05/07/01   . CYSTOSCOPY WITH URETEROSCOPY AND STENT PLACEMENT Right 01/28/2014   Procedure: CYSTOSCOPY WITH URETEROSCOPY, BASKET RETRIVAL AND  STENT PLACEMENT;  Surgeon: [name], MD;  Location: [facility];  Service: Urology;  Laterality: Right;  . epidural injections     multiple procedures  . [place]   multiple times  . EYE SURGERY Bilateral 03/25/10, 2012   cataract removal  . HEMORRHOID SURGERY  2014  . HIATAL HERNIA REPAIR  12/21/93  . HOLMIUM LASER APPLICATION Right 5/88/5027   Procedure: HOLMIUM LASER APPLICATION;  Surgeon: [name], MD;  Location: [facility];  Service: Urology;  Laterality: Right;  . INGUINAL HERNIA REPAIR Right 07/15/12   Dr [name], x2  . JOINT REPLACEMENT  07/25/04   right knee  . JOINT REPLACEMENT  07/13/10   left knee  . KNEE SURGERY  1995  . LAPAROSCOPIC CHOLECYSTECTOMY  09/20/06   with intraoperative cholangiogram and right inguinal herniorrhaphy with mesh   . LIGAMENT REPAIR Left 07/2013   shoulder  . LITHOTRIPSY     x2  . NASAL SEPTUM SURGERY  1975  . REFRACTIVE SURGERY Bilateral   . 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 SKIN CANCER DESTRUCTION     nose, ear  . TONSILLECTOMY  as child  . TOTAL HIP ARTHROPLASTY Left   . URETHRAL DILATION  1991   [name]    Family History  Problem Relation Age of Onset  . Cancer Mother   . Hypertension Other   . Cancer Other   . Osteoarthritis Other    Social History:  reports that he quit smoking about 41 years ago. His smoking use included cigarettes. He quit after 11.00 years of use. He has never used smokeless tobacco. He reports current alcohol use. He reports that he does not use drugs.  Allergies:  Allergies  Allergen Reactions  . Elastic Bandages & [Zinc] Other (See Comments)    Teflon bandages - Irritation  . Pravastatin     Muscle cramps  . Sildenafil     Tachycardia    . Metoclopramide Hcl Anxiety    Medications Prior to Admission  Medication Sig Dispense Refill  .  acetaminophen (TYLENOL) 650 MG CR tablet Take 1,300 mg by mouth every 8 (eight) hours as needed for pain.    [name] albuterol (VENTOLIN HFA) 108 (90 Base) MCG/ACT inhaler Inhale 2 puffs into the lungs every 6 (six) hours as needed for shortness of breath. 1 each 3  . allopurinol (ZYLOPRIM) 100 MG tablet TAKE 1 TABLET TWICE DAILY (Patient taking differently: Take 100 mg by mouth 2 (two) times daily. ) 180 tablet 1  . Artificial Tear Solution (SOOTHE XP OP) Place 1 drop into both eyes daily.    [name] aspirin EC 81 MG tablet Take 81 mg by mouth every morning.    . cholecalciferol (VITAMIN D3) 25 MCG (1000 UNIT) tablet Take 1,000 Units by mouth daily.    . Cranberry 500 MG TABS Take 500 mg by mouth daily.     . cycloSPORINE (RESTASIS) 0.05 % ophthalmic emulsion Place 1 drop into both eyes 2 (two) times daily.    . diclofenac sodium (VOLTAREN) 1 % GEL Apply 1 application topically daily as needed (arthritis). 100 g 2  . docusate sodium (COLACE) 100 MG capsule Take 1 capsule (100 mg total) by mouth 2 (two) times daily as needed for mild constipation. (Patient taking differently: Take 100 mg by mouth daily. ) 10 capsule 0  . fenofibrate (TRICOR) 145 MG tablet TAKE 1 TABLET EVERY DAY (Patient taking differently: Take 145 mg by mouth daily. ) 90 tablet 1  . ferrous sulfate 325 (65 FE) MG tablet Take 325 mg by mouth daily with breakfast.    . gabapentin (NEURONTIN) 300 MG capsule TAKE 1 CAPSULE TWO TO THREE TIMES DAILY AS NEEDED (Patient taking differently: Take 300 mg by mouth 2 (two) times daily as needed (pain). ) 60 capsule 1  . Homeopathic Products (THERAWORX RELIEF) FOAM Apply 1 application topically at bedtime.    [name] levothyroxine (SYNTHROID) 150 MCG tablet TAKE 1 TABLET ONE TIME DAILY ON 6 DAYS PER WEEK AND 1/2 TABLET ON 1 DAY PER WEEK (Patient taking differently: Take 75-150 mcg by mouth See admin instructions. Take 75 mcg on Sundays, take 150 mcg daily Mon - Sat) 78 tablet 1  . losartan (COZAAR) 100 MG tablet  Take 100 mg by mouth daily.    . Menthol, Topical Analgesic, (ZIMS MAX-FREEZE EX) Apply 1 application topically daily as needed (pain).    . multivitamin (THERAGRAN) per tablet Take 1 tablet by mouth daily.      [name] MYRBETRIQ 25 MG TB24 tablet Take 1 tablet (25 mg total) by mouth daily. 30 tablet 2  . nitroGLYCERIN (NITROSTAT) 0.4 MG SL tablet Place 1 tablet (0.4 mg total) under the tongue every 5 (five)  minutes as needed for chest pain. 25 tablet 3  . Omega-3 Fatty Acids (FISH OIL) 1200 MG CAPS Take 1,200 mg by mouth daily.    . rosuvastatin (CRESTOR) 5 MG tablet Take 1 tablet by mouth once daily (Patient taking differently: Take 5 mg by mouth daily. ) 90 tablet 0  . sodium chloride (OCEAN) 0.65 % nasal spray Place 1 spray into both nostrils at bedtime as needed for congestion.    . hydrALAZINE (APRESOLINE) 50 MG tablet Take 1 tablet (50 mg total) by mouth in the morning and at bedtime. (Patient not taking: Reported on 06/30/2020)    . losartan (COZAAR) 50 MG tablet Take 1 tablet (50 mg total) by mouth daily. (Patient not taking: Reported on 06/30/2020) 90 tablet 3    Results for orders placed or performed during the hospital encounter of 07/07/20 (from the past 48 hour(s))  SARS CORONAVIRUS 2 (TAT 6-24 HRS) Nasopharyngeal Nasopharyngeal Swab     Status: None   Collection Time: 07/07/20  2:24 PM   Specimen: Nasopharyngeal Swab  Result Value Ref Range   SARS Coronavirus 2 NEGATIVE NEGATIVE    Comment: (NOTE) SARS-CoV-2 target nucleic acids are NOT DETECTED.  The SARS-CoV-2 RNA is generally detectable in upper and lower respiratory specimens during the acute phase of infection. Negative results do not preclude SARS-CoV-2 infection, do not rule out co-infections with other pathogens, and should not be used as the sole basis for treatment or other patient management decisions. Negative results must be combined with clinical observations, patient history, and epidemiological information. The  expected result is Negative.  Fact Sheet for Patients: SugarRoll.be  Fact Sheet for Healthcare Providers: https://www.woods-mathews.com/  This test is not yet approved or cleared by the [place] FDA and  has been authorized for detection and/or diagnosis of SARS-CoV-2 by FDA under an Emergency Use Authorization (EUA). This EUA will remain  in effect (meaning this test can be used) for the duration of the COVID-19 declaration under Se ction 564(b)(1) of the Act, 21 U.S.C. section 360bbb-3(b)(1), unless the authorization is terminated or revoked sooner.  Performed at [facility], [place] [address], [place], [place] [address]    No results found.  Review of Systems  Musculoskeletal: Positive for neck pain.  Neurological: Positive for weakness and numbness.    Blood pressure (!) 148/69, pulse 60, temperature 98.1 F (36.7 C), temperature source Oral, resp. rate 17, height 5\' 6"  (1.676 m), weight 68.9 kg, SpO2 98 %. Physical Exam HENT:     Head: Normocephalic.     Nose: Nose normal.  Eyes:     Pupils: Pupils are equal, round, and reactive to light.  Cardiovascular:     Rate and Rhythm: Normal rate.     Pulses: Normal pulses.  Pulmonary:     Effort: Pulmonary effort is normal.  Abdominal:     General: Abdomen is flat.  Musculoskeletal:        General: Normal range of motion.  Skin:    General: Skin is warm.  Neurological:     General: No focal deficit present.     Mental Status: He is alert.     Comments: Patient is awake and alert weakness in hand intrinsics bilaterally actually left greater than right at about 4+ out of 5 bicep tricep all appear to be 5 out of 5 lower extremities appear to be 5 out of 5      Assessment/Plan 79 year old presents for ACDF C3-4  Elaina Hoops, MD  07/09/2020, 11:22 AM

## 2020-07-09 NOTE — Transfer of Care (Signed)
Immediate Anesthesia Transfer of Care Note  Patient: [name] Pratt  Procedure(s) Performed: ANTERIOR CERVICAL DECOMPRESSION AND FUSION CERVICAL THREE-FOUR. (N/A Spine Cervical)  Patient Location: PACU  Anesthesia Type:General  Level of Consciousness: awake, drowsy and patient cooperative  Airway & Oxygen Therapy: Patient Spontanous Breathing and Patient connected to face mask oxygen  Post-op Assessment: Report given to RN and Post -op Vital signs reviewed and stable  Post vital signs: Reviewed and stable  Last Vitals:  Vitals Value Taken Time  BP 156/69 07/09/20 1406  Temp    Pulse 80 07/09/20 1407  Resp 20 07/09/20 1407  SpO2 100 % 07/09/20 1407  Vitals shown include unvalidated device data.  Last Pain:  Vitals:   07/09/20 1027  TempSrc:   PainSc: 0-No pain      Patients Stated Pain Goal: 3 (97/98/92 1194)  Complications: No complications documented.

## 2020-07-09 NOTE — Op Note (Signed)
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  Work-up revealed severe cord compression at C3-4 from a large spondylitic spur as well as disc.  Due to patient's progression of clinical syndrome imaging findings and failed conservative treatment I recommended anterior cervical discectomy and fusion.  I extensively went over the risks and benefits of the operation with him as well as perioperative course expectations of outcome and alternatives of surgery and he understood and agreed to proceed forward.  Operative procedure: Patient was brought into the OR was due to general anesthesia positioned supine the neck in slight extension 5 pounds halter traction the right side was next prepped and draped in routine sterile fashion.  Preoperative x-ray localized the appropriate level so a curvilinear incision was made just off the midline to the intraborder of the sternocleidomastoid and superficial layer of the platysma was dissected out divided longitudinally.  The avascular plane between the sternocleidomastoid and strap muscles was developed down to the prevertebral fascia and pretty fascia was dissected away with Kitners.  Intraoperative x-ray confirmed the disc base below at C4-5 I then took attention 1 disc base above this dissected longus Coley laterally self-retaining retractor was placed.  Large anterior osteophytes were bitten off with a Leksell rongeur 1 and 2 from the  [place] punch.  Disc base was then opened up utilizing Caspar distracting pins and further anterior spurs were drilled down with a high-speed drill capturing the bone shavings and mucus trap.  Under microscopic lamination I scraped the endplates with be a curette identify and try to preserve the cartilaginous endplate.  I then drilled off posterior spur under microscopic lamination.  Under biting of both endplates allowed indication posterior large ligament was marked hypertrophy of the posterior large ligament extensive my disc material and large spur causing severe cord compression this was all removed very carefully in piecemeal fashion and marching laterally identified both C4 pedicles and decompress both C4 nerve roots to ensure adequate lateral discectomy.  I aggressively under bit both endplates decompressing the central canal and at the end of decompression was no further stenosis on the spinal cord at that level I selected a 6 mm parallel titanium cage packed with locally harvested autograft mixed with division.  I then selected a 14 mm globus extend plate all screws had excellent purchase and postop fluoroscopy confirmed good position of all the hardware and implants.  Meticulous hemostasis was maintained Gelfoam was escorted around the graft at packed some additional bone graft underneath the plate lateral to the cage.  Then closed the wound in layers with Vicryl and Pitocin a running 4 subcuticular Dermabond benzoin Steri-Strips and a sterile dressing was applied patient recovery in stable condition.  At the end the case all needle counts and sponge counts were correct.

## 2020-07-09 NOTE — Anesthesia Postprocedure Evaluation (Signed)
Anesthesia Post Note  Patient: [name] Pratt  Procedure(s) Performed: ANTERIOR CERVICAL DECOMPRESSION AND FUSION CERVICAL THREE-FOUR. (N/A Spine Cervical)     Patient location during evaluation: PACU Anesthesia Type: General Level of consciousness: sedated and patient cooperative Pain management: pain level controlled Vital Signs Assessment: post-procedure vital signs reviewed and stable Respiratory status: spontaneous breathing Cardiovascular status: stable Anesthetic complications: no   No complications documented.  Last Vitals:  Vitals:   07/09/20 1438 07/09/20 1452  BP: (!) 143/73 136/70  Pulse: 81 79  Resp: 12 15  Temp:    SpO2: 97% 94%    Last Pain:  Vitals:   07/09/20 1406  TempSrc:   PainSc: [place]

## 2020-07-09 NOTE — Progress Notes (Signed)
PHARMACY NOTE:  ANTIMICROBIAL RENAL DOSAGE ADJUSTMENT  Current antimicrobial regimen includes a mismatch between antimicrobial dosage and estimated renal function.  As per policy approved by the Pharmacy & Therapeutics and Medical Executive Committees, the antimicrobial dosage will be adjusted accordingly.  Current antimicrobial dosage:  Cefazolin 2g Q8 hr   Indication: surgical prophylaxis  Renal Function:  Estimated Creatinine Clearance: 29.4 mL/min (A) (by C-G formula based on SCr of 1.81 mg/dL (H)).      Antimicrobial dosage has been changed to:  Cefazolin 2g Q 12hr     Thank you for allowing pharmacy to be a part of this patient's care.  Benetta Spar, PharmD, BCPS, BCCP Clinical Pharmacist  Please check AMION for all [place] phone numbers After 10:00 PM, call [place] [phone]

## 2020-07-09 NOTE — Anesthesia Procedure Notes (Signed)
Procedure Name: Intubation Date/Time: 07/09/2020 12:04 PM Performed by: Dorthea Cove, CRNA Pre-anesthesia Checklist: Patient identified, Emergency Drugs available, Suction available and Patient being monitored Patient Re-evaluated:Patient Re-evaluated prior to induction Oxygen Delivery Method: Circle system utilized Preoxygenation: Pre-oxygenation with 100% oxygen Induction Type: IV induction Ventilation: Mask ventilation without difficulty and Oral airway inserted - appropriate to patient size Laryngoscope Size: Glidescope and 3 Grade View: Grade I Tube type: Oral Tube size: 7.5 mm Number of attempts: 1 Airway Equipment and Method: Stylet,  Oral airway and Video-laryngoscopy Placement Confirmation: ETT inserted through vocal cords under direct vision,  positive ETCO2 and breath sounds checked- equal and bilateral Secured at: 21 cm Tube secured with: Tape Dental Injury: Teeth and Oropharynx as per pre-operative assessment

## 2020-07-10 DIAGNOSIS — Z87891 Personal history of nicotine dependence: Secondary | ICD-10-CM | POA: Diagnosis not present

## 2020-07-10 DIAGNOSIS — E039 Hypothyroidism, unspecified: Secondary | ICD-10-CM | POA: Diagnosis not present

## 2020-07-10 DIAGNOSIS — Z96653 Presence of artificial knee joint, bilateral: Secondary | ICD-10-CM | POA: Diagnosis not present

## 2020-07-10 DIAGNOSIS — J449 Chronic obstructive pulmonary disease, unspecified: Secondary | ICD-10-CM | POA: Diagnosis not present

## 2020-07-10 DIAGNOSIS — Z85828 Personal history of other malignant neoplasm of skin: Secondary | ICD-10-CM | POA: Diagnosis not present

## 2020-07-10 DIAGNOSIS — I10 Essential (primary) hypertension: Secondary | ICD-10-CM | POA: Diagnosis not present

## 2020-07-10 DIAGNOSIS — M5001 Cervical disc disorder with myelopathy,  high cervical region: Secondary | ICD-10-CM | POA: Diagnosis not present

## 2020-07-10 DIAGNOSIS — M4802 Spinal stenosis, cervical region: Secondary | ICD-10-CM | POA: Diagnosis not present

## 2020-07-10 DIAGNOSIS — Z96641 Presence of right artificial hip joint: Secondary | ICD-10-CM | POA: Diagnosis not present

## 2020-07-10 NOTE — Evaluation (Signed)
Occupational Therapy Evaluation Patient Details Name: [name] Pratt MRN: [id number] DOB: [birth year] Today's Date: 07/10/2020    History of Present Illness 79 year old gentleman is a progressive worsening difficulties with his balance in addition to loss of strength and loss of sensation in his hands and difficulty opening jars and buttoning close.  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 UnderwentAnterior cervical discectomy and fusion at C3-4.   Clinical Impression   Patient admitted for the above diagnosis and procedure.  Presents with expected discomfort in his neck, continued numbness to bilateral hands and generalized weakness to bilateral legs.  The above were present upon admission, and thus, he is essentially close to, or at, his prior level of function.  PT eval pending, but he expects to discharge today.  No follow up OT required at home.          Follow Up Recommendations  No OT follow up    Equipment Recommendations  Other (comment) (sock aid)    Recommendations for Other Services       Precautions / Restrictions Precautions Precautions: Cervical;Fall Precaution Booklet Issued: Yes (comment) Required Braces or Orthoses: Cervical Brace Cervical Brace: Soft collar Restrictions Weight Bearing Restrictions: No      Mobility Bed Mobility Overal bed mobility: Modified Independent             General bed mobility comments: Mod I with HOB elevated    Transfers Overall transfer level: Modified independent                    Balance Overall balance assessment: Mild deficits observed, not formally tested                                         ADL either performed or assessed with clinical judgement   ADL Overall ADL's : Modified independent                                       General ADL Comments: Able to complete light grooming without assist.  Toileting and  mobility in room reaching for objects in his environment.  Dressed himself from sit/stand level.  Minimal cueing.     Vision Baseline Vision/History: No visual deficits Patient Visual Report: No change from baseline       Perception     Praxis      Pertinent Vitals/Pain Pain Assessment: Faces Faces Pain Scale: Hurts a little bit Pain Descriptors / Indicators: Aching Pain Intervention(s): Monitored during session     Hand Dominance Right   Extremity/Trunk Assessment Upper Extremity Assessment Upper Extremity Assessment: Overall WFL for tasks assessed   Lower Extremity Assessment Lower Extremity Assessment: Defer to PT evaluation   Cervical / Trunk Assessment Cervical / Trunk Assessment: Kyphotic   Communication Communication Communication: No difficulties   Cognition Arousal/Alertness: Awake/alert Behavior During Therapy: WFL for tasks assessed/performed Overall Cognitive Status: Within Functional Limits for tasks assessed                                     General Comments  Baseline.  History of falls.    Exercises     Shoulder Instructions      Home  Living Family/patient expects to be discharged to:: Private residence Living Arrangements: Spouse/significant other Available Help at Discharge: Available 24 hours/day;Family Type of Home: House Home Access: Stairs to enter Technical brewer of Steps: 6 Entrance Stairs-Rails: Right Home Layout: Multi-level     Bathroom Shower/Tub: Occupational psychologist: Standard     Home Equipment: Environmental consultant - 2 wheels;Shower seat;Cane - single point          Prior Functioning/Environment Level of Independence: Independent                 OT Problem List: Impaired balance (sitting and/or standing)      OT Treatment/Interventions:      OT Goals(Current goals can be found in the care plan section) Acute Rehab OT Goals Patient Stated Goal: Better use of my hands OT Goal  Formulation: With patient Time For Goal Achievement: 07/19/20 Potential to Achieve Goals: Good  OT Frequency:     Barriers to D/C:  None         Co-evaluation              AM-PAC OT "6 Clicks" Daily Activity     Outcome Measure Help from another person eating meals?: None Help from another person taking care of personal grooming?: None Help from another person toileting, which includes using toliet, bedpan, or urinal?: None Help from another person bathing (including washing, rinsing, drying)?: None Help from another person to put on and taking off regular upper body clothing?: None Help from another person to put on and taking off regular lower body clothing?: None 6 Click Score: 24   End of Session Equipment Utilized During Treatment: Cervical collar Nurse Communication: Mobility status  Activity Tolerance: Patient tolerated treatment well Patient left: in chair;with call bell/phone within reach  OT Visit Diagnosis: Unsteadiness on feet (R26.81)                Time: 2248-2500 OT Time Calculation (min): 34 min Charges:  OT General Charges $OT Visit: 1 Visit OT Evaluation $OT Eval Moderate Complexity: 1 Mod OT Treatments $Self Care/Home Management : 8-22 mins  07/10/2020  Rich, OTR/L  Acute Rehabilitation Services  Office:  [phone]   Metta Clines 07/10/2020, 8:54 AM

## 2020-07-10 NOTE — Plan of Care (Signed)
Pt adequate for discharge Discharge instructions given. Pt verbalized understanding.

## 2020-07-10 NOTE — Plan of Care (Signed)
Adequate for discharge.

## 2020-07-10 NOTE — Discharge Instructions (Addendum)
Wound Care  Keep the incision clean and dry Remove the outer dressing in 2 days, leave the Steri-Strips intact.  Do not put any creams, lotions, or ointments on incision. Leave steri-strips on neck.  They will fall off by themselves.  Activity Walk each and every day, increasing distance each day. No lifting greater than 5 lbs.  No lifting, no bending, no twisting, no driving or riding a car unless coming back and forth to see me. Wear you collar at all times Diet Resume your normal diet.   Call Your Doctor If Any of These Occur Redness, drainage, or swelling at the wound.  Temperature greater than 101 degrees. Severe pain not relieved by pain medication. Incision starts to come apart. Follow Up Appt Call today for appointment in 1-2 weeks ([phone]) or for problems.  If you have any hardware placed in your spine, you will need an x-ray before your appointment.

## 2020-07-10 NOTE — Evaluation (Signed)
Physical Therapy Evaluation Patient Details Name: [name] Pratt MRN: [id number] DOB: [birth year] Today's Date: 07/10/2020   History of Present Illness  The pt is an 79 yo male presenting s/p ACDF C3-4 on 10/29 due to progressive numbness and weakness in BUE and balance deficits. PMH includes: skin cancer, COPD, HLD, HTN, OSA, bilateral TKA and THA.   Clinical Impression  Pt OOB bed in chair upon arrival of PT, agreeable to evaluation at this time. Prior to admission the pt was independent with mobility without use of AD, but used furniture walking in his home and reports history of falls. The pt now presents with limitations in functional mobility, strength, endurance, and dynamic stability due to above dx, and will continue to benefit from skilled PT to address these deficits. The pt was able to complete walking both with and without use of RW, but demos significantly improved stability, less lateral drift/sway, and fewer staggering steps with use of RW for BUE support. The pt does demo poor muscular endurance in his RLE with continued ambulation, resulting in R foot drop and increased challenge with advancement of his RLE. The pt was able to complete navigation of 10 steps with use of single rail, but requires minG to minA to maintain stability and for safety due to poor clearance bilaterally. The pt will continue to benefit from skilled PT in OP setting to progress functional stability and further reduce risk of falls following d/c, but is safe to return home with his wife for assist as needed when medically cleared for d/c.      Follow Up Recommendations Outpatient PT;Supervision for mobility/OOB (for balance)    Equipment Recommendations  Rolling walker with 5" wheels    Recommendations for Other Services       Precautions / Restrictions Precautions Precautions: Cervical;Fall Precaution Booklet Issued: Yes (comment) Precaution Comments: pt verbalized understanding of  precautions Required Braces or Orthoses: Cervical Brace Cervical Brace: Soft collar Restrictions Weight Bearing Restrictions: No      Mobility  Bed Mobility Overal bed mobility: Modified Independent             General bed mobility comments: Mod I with HOB elevated    Transfers Overall transfer level: Modified independent Equipment used: None                Ambulation/Gait Ambulation/Gait assistance: Min guard Gait Distance (Feet): 150 Feet Assistive device: Rolling walker (2 wheeled);None Gait Pattern/deviations: Step-through pattern;Decreased stride length;Shuffle;Decreased dorsiflexion - right;Staggering left;Staggering right;Trunk flexed Gait velocity: 0.5 m/s Gait velocity interpretation: <1.8 ft/sec, indicate of risk for recurrent falls General Gait Details: significant drifting, staggering steps without AD or UE support, improved with RW but pt with progressive weakness RLE resulting in mild foot drop with continued activity  Stairs Stairs: Yes Stairs assistance: Min guard Stair Management: One rail Left;Alternating pattern;Forwards Number of Stairs: 10 General stair comments: alternating steps with minG for safety, poor clearance with wach step. minor LOB with descending staris pt able to recver without assist      Balance Overall balance assessment: Mild deficits observed, not formally tested                                           Pertinent Vitals/Pain Pain Assessment: Faces Faces Pain Scale: Hurts a little bit Pain Location: neck/surgical site Pain Descriptors / Indicators: Sore Pain Intervention(s): Limited activity within patient's tolerance;Monitored  during session    [place] expects to be discharged to:: Private residence Living Arrangements: Spouse/significant other Available Help at Discharge: Available 24 hours/day;Family Type of Home: House Home Access: Stairs to enter Entrance Stairs-Rails:  Psychiatric nurse of Steps: 6 Home Layout: Multi-level Home Equipment: Walker - standard;Shower seat;Cane - single point Additional Comments: pt reports he has a standard walker he needs to pick up with each step    Prior Function Level of Independence: Independent         Comments: pt reports no recent falls, but uses furniture walking in house. reports improved balance with OPPT in past until recent onset of progressive umbness in UEs     Hand Dominance   Dominant Hand: Right    Extremity/Trunk Assessment   Upper Extremity Assessment Upper Extremity Assessment: Defer to OT evaluation    Lower Extremity Assessment Lower Extremity Assessment: Overall WFL for tasks assessed;RLE deficits/detail RLE Deficits / Details: slight foot drop and weakness in RLE with progressive use.     Cervical / Trunk Assessment Cervical / Trunk Assessment: Kyphotic;Other exceptions Cervical / Trunk Exceptions: s/p cervical surgery  Communication   Communication: No difficulties  Cognition Arousal/Alertness: Awake/alert Behavior During Therapy: WFL for tasks assessed/performed Overall Cognitive Status: Within Functional Limits for tasks assessed                                 General Comments: Slight difference in pt report compared to session with OT earlier this morning. Slight decrease in safety awareness (pt asking why he is unable to drive following cervical surgery), but amenable to education      General Comments General comments (skin integrity, edema, etc.): pt with noted poor balance prior to surgery, reports he has seen improvement with OPPT in past, agreeable to return    Exercises     Assessment/Plan    PT Assessment Patient needs continued PT services  PT Problem List Decreased balance;Decreased mobility;Decreased coordination;Decreased safety awareness       PT Treatment Interventions DME instruction;Gait training;Stair training;Functional  mobility training;Therapeutic activities;Therapeutic exercise;Balance training;Patient/family education    PT Goals (Current goals can be found in the Care Plan section)  Acute Rehab PT Goals Patient Stated Goal: reduce falls PT Goal Formulation: With patient Time For Goal Achievement: 07/24/20 Potential to Achieve Goals: Good    Frequency Min 5X/week    AM-PAC PT "6 Clicks" Mobility  Outcome Measure Help needed turning from your back to your side while in a flat bed without using bedrails?: None Help needed moving from lying on your back to sitting on the side of a flat bed without using bedrails?: None Help needed moving to and from a bed to a chair (including a wheelchair)?: None Help needed standing up from a chair using your arms (e.g., wheelchair or bedside chair)?: None Help needed to walk in hospital room?: A Little Help needed climbing 3-5 steps with a railing? : A Little 6 Click Score: 22    End of Session Equipment Utilized During Treatment: Gait belt;Cervical collar Activity Tolerance: Patient tolerated treatment well Patient left: in chair;with call bell/phone within reach Nurse Communication: Mobility status PT Visit Diagnosis: Unsteadiness on feet (R26.81);Difficulty in walking, not elsewhere classified (R26.2)    Time: 0355-9741 PT Time Calculation (min) (ACUTE ONLY): 18 min   Charges:   PT Evaluation $PT Eval Moderate Complexity: 1 Mod  Hardie Pulley, DPT   Acute Rehabilitation Department Pager #: [phone]  Otho Bellows 07/10/2020, 10:02 AM

## 2020-07-10 NOTE — Discharge Summary (Signed)
Physician Discharge Summary  Patient ID: [name] Pratt MRN: [id number] DOB/AGE: 01-10-1941 79 y.o. Estimated body mass index is 24.52 kg/m as calculated from the following:   Height as of this encounter: 5\' 6"  (1.676 m).   Weight as of this encounter: 68.9 kg.   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  Patient stable for discharge home.  Consults: Significant Diagnostic Studies: Treatments: ACDF C3-4 Discharge Exam: Blood pressure 123/64, pulse 60, temperature 97.8 F (36.6 C), temperature source Oral, resp. rate 18, height 5\' 6"  (1.676 m), weight 68.9 kg, SpO2 100 %. Strength out of 5 wound clean dry and intact  Disposition: Home  Discharge Instructions    Incentive spirometry RT   Complete by: As directed      Allergies as of 07/10/2020      Reactions   Elastic Bandages & [zinc] Other (See Comments)   Teflon bandages - Irritation   Pravastatin    Muscle cramps   Sildenafil    Tachycardia   Metoclopramide Hcl Anxiety      Medication List    TAKE these medications   acetaminophen 650 MG CR tablet Commonly known as: TYLENOL Take 1,300 mg by mouth every 8 (eight) hours as needed for pain.   albuterol 108 (90 Base) MCG/ACT inhaler Commonly known as: VENTOLIN HFA Inhale 2 puffs into the lungs every 6 (six) hours as needed for shortness of breath.   allopurinol 100 MG tablet Commonly known as: ZYLOPRIM TAKE 1 TABLET TWICE DAILY   aspirin EC 81 MG tablet Take 81 mg by mouth every morning.   cholecalciferol 25 MCG (1000 UNIT) tablet Commonly known as: VITAMIN D3 Take 1,000 Units  by mouth daily.   Cranberry 500 MG Tabs Take 500 mg by mouth daily.   cycloSPORINE 0.05 % ophthalmic emulsion Commonly known as: RESTASIS Place 1 drop into both eyes 2 (two) times daily.   diclofenac sodium 1 % Gel Commonly known as: VOLTAREN Apply 1 application topically daily as needed (arthritis).   docusate sodium 100 MG capsule Commonly known as: Colace Take 1 capsule (100 mg total) by mouth 2 (two) times daily as needed for mild constipation. What changed: when to take this   fenofibrate 145 MG tablet Commonly known as: TRICOR TAKE 1 TABLET EVERY DAY   ferrous sulfate 325 (65 FE) MG tablet Take 325 mg by mouth daily with breakfast.   Fish Oil 1200 MG Caps Take 1,200 mg by mouth daily.   gabapentin 300 MG capsule Commonly known as: NEURONTIN TAKE 1 CAPSULE TWO TO THREE TIMES DAILY AS NEEDED What changed: See the new instructions.   hydrALAZINE 50 MG tablet Commonly known as: APRESOLINE Take 1 tablet (50 mg total) by mouth in the morning and at bedtime.   levothyroxine 150 MCG tablet Commonly known as: SYNTHROID TAKE 1 TABLET ONE TIME DAILY ON 6 DAYS PER WEEK AND 1/2 TABLET ON 1 DAY PER WEEK What changed: See the new instructions.   losartan 100 MG tablet Commonly known as: COZAAR Take 100 mg by mouth daily.   multivitamin per tablet Take 1 tablet by mouth daily.   Myrbetriq 25 MG Tb24 tablet Generic drug: mirabegron ER  Take 1 tablet (25 mg total) by mouth daily.   nitroGLYCERIN 0.4 MG SL tablet Commonly known as: Nitrostat Place 1 tablet (0.4 mg total) under the tongue every 5 (five) minutes as needed for chest pain.   rosuvastatin 5 MG tablet Commonly known as: CRESTOR Take 1 tablet by mouth once daily   sodium chloride 0.65 % nasal spray Commonly known as: OCEAN Place 1 spray into both nostrils at bedtime as needed for congestion.   SOOTHE XP OP Place 1 drop into both eyes daily.   Theraworx Relief Foam Apply 1 application topically at  bedtime.   ZIMS MAX-FREEZE EX Apply 1 application topically daily as needed (pain).        Signed: Elaina Hoops 07/10/2020, 8:32 AM

## 2020-07-12 ENCOUNTER — Encounter (HOSPITAL_COMMUNITY): Payer: Self-pay | Admitting: Neurosurgery

## 2020-07-14 ENCOUNTER — Telehealth: Payer: Self-pay | Admitting: Pharmacist

## 2020-07-14 NOTE — Progress Notes (Addendum)
Chronic Care Management Pharmacy Assistant   Name: HADDEN STEIG  MRN: [id number] DOB: 04-May-1941  Reason for Encounter: Medication Review/ Initial Questions for Pharmacist appointment on 07-15-20.  Patient Questions:  1.  Have you seen any other providers since your last visit? No  2.  Any changes in your medicines or health? No    PCP : [name], NP  Allergies:   Allergies  Allergen Reactions  . Elastic Bandages & [Zinc] Other (See Comments)    Teflon bandages - Irritation  . Pravastatin     Muscle cramps  . Sildenafil     Tachycardia    . Metoclopramide Hcl Anxiety    Medications: Outpatient Encounter Medications as of 07/14/2020  Medication Sig  . acetaminophen (TYLENOL) 650 MG CR tablet Take 1,300 mg by mouth every 8 (eight) hours as needed for pain.  [name] albuterol (VENTOLIN HFA) 108 (90 Base) MCG/ACT inhaler Inhale 2 puffs into the lungs every 6 (six) hours as needed for shortness of breath.  . allopurinol (ZYLOPRIM) 100 MG tablet TAKE 1 TABLET TWICE DAILY (Patient taking differently: Take 100 mg by mouth 2 (two) times daily. )  . Artificial Tear Solution (SOOTHE XP OP) Place 1 drop into both eyes daily.  [name] aspirin EC 81 MG tablet Take 81 mg by mouth every morning.  . cholecalciferol (VITAMIN D3) 25 MCG (1000 UNIT) tablet Take 1,000 Units by mouth daily.  . Cranberry 500 MG TABS Take 500 mg by mouth daily.   . cycloSPORINE (RESTASIS) 0.05 % ophthalmic emulsion Place 1 drop into both eyes 2 (two) times daily.  . diclofenac sodium (VOLTAREN) 1 % GEL Apply 1 application topically daily as needed (arthritis).  [name] docusate sodium (COLACE) 100 MG capsule Take 1 capsule (100 mg total) by mouth 2 (two) times daily as needed for mild constipation. (Patient taking differently: Take 100 mg by mouth daily. )  . fenofibrate (TRICOR) 145 MG tablet TAKE 1 TABLET EVERY DAY (Patient taking differently: Take 145 mg by mouth daily. )  . ferrous sulfate 325 (65 FE) MG  tablet Take 325 mg by mouth daily with breakfast.  . gabapentin (NEURONTIN) 300 MG capsule TAKE 1 CAPSULE TWO TO THREE TIMES DAILY AS NEEDED (Patient taking differently: Take 300 mg by mouth 2 (two) times daily as needed (pain). )  . Homeopathic Products (THERAWORX RELIEF) FOAM Apply 1 application topically at bedtime.  . hydrALAZINE (APRESOLINE) 50 MG tablet Take 1 tablet (50 mg total) by mouth in the morning and at bedtime. (Patient not taking: Reported on 06/30/2020)  . levothyroxine (SYNTHROID) 150 MCG tablet TAKE 1 TABLET ONE TIME DAILY ON 6 DAYS PER WEEK AND 1/2 TABLET ON 1 DAY PER WEEK (Patient taking differently: Take 75-150 mcg by mouth See admin instructions. Take 75 mcg on Sundays, take 150 mcg daily Mon - Sat)  . losartan (COZAAR) 100 MG tablet Take 100 mg by mouth daily.  . Menthol, Topical Analgesic, (ZIMS MAX-FREEZE EX) Apply 1 application topically daily as needed (pain).  . multivitamin (THERAGRAN) per tablet Take 1 tablet by mouth daily.    [name] MYRBETRIQ 25 MG TB24 tablet Take 1 tablet (25 mg total) by mouth daily.  . nitroGLYCERIN (NITROSTAT) 0.4 MG SL tablet Place 1 tablet (0.4 mg total) under the tongue every 5 (five) minutes as needed for chest pain.  . Omega-3 Fatty Acids (FISH OIL) 1200 MG CAPS Take 1,200 mg by mouth daily.  . rosuvastatin (CRESTOR) 5 MG tablet Take 1 tablet  by mouth once daily (Patient taking differently: Take 5 mg by mouth daily. )  . sodium chloride (OCEAN) 0.65 % nasal spray Place 1 spray into both nostrils at bedtime as needed for congestion.   No facility-administered encounter medications on file as of 07/14/2020.    Current Diagnosis: Patient Active Problem List   Diagnosis Date Noted  . Status post cervical spinal fusion 07/09/2020  . History of total bilateral knee replacement 07/08/2020  . Aortic atherosclerosis ([place]) 03/05/2020  . Emphysema, unspecified ([place]) 03/05/2020  . SIRS (systemic inflammatory response syndrome) ([place]) 03/04/2020  .  Heme positive stool 02/29/2020  . Leg cramps 11/13/2019  . Tibialis anterior tendon tear, nontraumatic 11/13/2019  . 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 CPAP (continuous positive airway pressure) dependence 03/25/2018  . 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 Acute idiopathic gout of hand 03/25/2018  . Erectile dysfunction 06/20/2017  . 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 Peripheral neuropathy 06/19/2016  . Hearing loss 02/02/2016  . 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 Allergic rhinitis 02/02/2016  . Atypical chest pain 11/24/2015  . Preventative health care 11/24/2015  . Onychomycosis 06/30/2015  . Degenerative disc disease, lumbar 06/30/2015  . Other allergic rhinitis 02/18/2015  . Deviated nasal septum 02/18/2015  . OSA on CPAP 02/18/2015  . RLS (restless legs syndrome) 02/18/2015  . GERD (gastroesophageal reflux disease) 09/18/2014  . Hyperglycemia 03/03/2014  . Sleep apnea with use of continuous positive airway pressure (CPAP) 07/15/2013  . Carotid stenosis 02/24/2013  . Nonspecific abnormal electrocardiogram (ECG) (EKG) 01/06/2013  . DJD (degenerative joint disease) of hip 12/24/2012  . Lower GI bleed 12/12/2012  . Dyspnea 06/12/2011  . Hypothyroidism 05/13/2009  . HYPERCHOLESTEROLEMIA 05/13/2009  . Hyperlipidemia 05/13/2009  . 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  . Essential hypertension 05/13/2009  . INGUINAL HERNIA 05/13/2009  . HIATAL HERNIA 05/13/2009  . NEPHROLITHIASIS 05/13/2009  . ROSACEA 05/13/2009    Goals Addressed   None    Have you seen any other providers since your last visit? No Any changes in your medications or health? No Any side effects from any medications? No Do you have an symptoms or problems not managed by your medications? No Any concerns about your health right now? Yes states he has trouble walking.  States left side is weak.  States balance is still bad.  States he has numbness in his  fingers.   States he is very tired lately Has your provider asked that you check blood pressure, blood sugar, or follow special diet at home? States blood pressure is pretty good.  Does not check sugar due to readings run.   Reports he does not eat much Do you get any type of exercise on a regular basis? Balance is bad now but would love to walk.  Can you think of a goal you would like to reach for your health? Would like to get back to normal.  Would like the stiffness of his arms and hands to go away Do you have any problems getting your medications? No states medications do cost too much and feels as if he makes too much money for PAP Is there anything that you would like to discuss during the appointment? Resolved stiffness in hands and would like to walk better  Please bring medications and supplements to appointment.   Follow-Up:  Pharmacist Review   [place] Shannon, [facility] [place] Pharmacist Assistant [phone]  Reviewed  by: De Blanch, PharmD Clinical Pharmacist [facility] [phone]

## 2020-07-15 ENCOUNTER — Ambulatory Visit: Payer: Medicare HMO | Admitting: Pharmacist

## 2020-07-15 ENCOUNTER — Other Ambulatory Visit: Payer: Self-pay

## 2020-07-15 VITALS — BP 140/68 | HR 63 | Temp 98.0°F | Wt 145.0 lb

## 2020-07-15 DIAGNOSIS — I10 Essential (primary) hypertension: Secondary | ICD-10-CM

## 2020-07-15 DIAGNOSIS — E785 Hyperlipidemia, unspecified: Secondary | ICD-10-CM

## 2020-07-15 NOTE — Chronic Care Management (AMB) (Signed)
Chronic Care Management Pharmacy  Name: [name] Pratt  MRN: [id number] DOB: 27-Jul-1941  Chief Complaint/ HPI  [name] Pratt,  79 y.o. , male presents for their Initial CCM visit with the clinical pharmacist In office.  PCP : [name], NP  Their chronic conditions include: Hypertension, Hyperlipidemia/Aortic Atherosclerosis, Hypothyroidism, Emphysema, Cervical Stenosis, Neuropathy, Cystoid Macular Edema, Overactive Bladder  Office Visits: 06/29/20: Visit w/ [name], NP - Nephro changed losartan/hctz to losartan only. Pt has not started and recommended to leave aside for now. Continue losartan 60m and hydralazine 550m Consult Visit: 07/09/20: Sports med visit w/ Dr. ScRaeford Razor No concern for the surgical hardware at the knee. Counseled on supportive care, hinged knee brace, rolling walker, and could consider PT.   06/28/20: Cardio visit w/ Dr. NiJohnsie Cancel no med changes noted.  Medications: Outpatient Encounter Medications as of 07/15/2020  Medication Sig  . acetaminophen (TYLENOL) 650 MG CR tablet Take 1,300 mg by mouth every 8 (eight) hours as needed for pain.  . Marland Kitchenlbuterol (VENTOLIN HFA) 108 (90 Base) MCG/ACT inhaler Inhale 2 puffs into the lungs every 6 (six) hours as needed for shortness of breath.  . allopurinol (ZYLOPRIM) 100 MG tablet TAKE 1 TABLET TWICE DAILY (Patient taking differently: Take 100 mg by mouth 2 (two) times daily. )  . Artificial Tear Solution (SOOTHE XP OP) Place 1 drop into both eyes daily.  . Marland Kitchenspirin EC 81 MG tablet Take 81 mg by mouth every morning.  . cholecalciferol (VITAMIN D3) 25 MCG (1000 UNIT) tablet Take 1,000 Units by mouth daily.  . Cranberry 500 MG TABS Take 500 mg by mouth daily.   . cycloSPORINE (RESTASIS) 0.05 % ophthalmic emulsion Place 1 drop into both eyes 2 (two) times daily.  . diclofenac sodium (VOLTAREN) 1 % GEL Apply 1 application topically daily as needed (arthritis).  . Marland Kitchenocusate sodium (COLACE) 100 MG  capsule Take 1 capsule (100 mg total) by mouth 2 (two) times daily as needed for mild constipation. (Patient taking differently: Take 100 mg by mouth daily. )  . fenofibrate (TRICOR) 145 MG tablet TAKE 1 TABLET EVERY Rifka Ramey (Patient taking differently: Take 145 mg by mouth daily. )  . ferrous sulfate 325 (65 FE) MG tablet Take 325 mg by mouth daily with breakfast.  . gabapentin (NEURONTIN) 300 MG capsule TAKE 1 CAPSULE TWO TO THREE TIMES DAILY AS NEEDED (Patient taking differently: Take 300 mg by mouth 2 (two) times daily as needed (pain). )  . Homeopathic Products (THERAWORX RELIEF) FOAM Apply 1 application topically at bedtime.  . hydrALAZINE (APRESOLINE) 50 MG tablet Take 1 tablet (50 mg total) by mouth in the morning and at bedtime. (Patient not taking: Reported on 06/30/2020)  . levothyroxine (SYNTHROID) 150 MCG tablet TAKE 1 TABLET ONE TIME DAILY ON 6 DAYS PER WEEK AND 1/2 TABLET ON 1 [name] Pratt PER WEEK (Patient taking differently: Take 75-150 mcg by mouth See admin instructions. Take 75 mcg on Sundays, take 150 mcg daily Mon - Sat)  . losartan (COZAAR) 100 MG tablet Take 100 mg by mouth daily.  . Menthol, Topical Analgesic, (ZIMS MAX-FREEZE EX) Apply 1 application topically daily as needed (pain).  . multivitamin (THERAGRAN) per tablet Take 1 tablet by mouth daily.    . Marland KitchenYRBETRIQ 25 MG TB24 tablet Take 1 tablet (25 mg total) by mouth daily.  . nitroGLYCERIN (NITROSTAT) 0.4 MG SL tablet Place 1 tablet (0.4 mg total) under the tongue every 5 (five) minutes as needed for chest pain.  .[name]  Omega-3 Fatty Acids (FISH OIL) 1200 MG CAPS Take 1,200 mg by mouth daily.  . rosuvastatin (CRESTOR) 5 MG tablet Take 1 tablet by mouth once daily (Patient taking differently: Take 5 mg by mouth daily. )  . sodium chloride (OCEAN) 0.65 % nasal spray Place 1 spray into both nostrils at bedtime as needed for congestion.   No facility-administered encounter medications on file as of 07/15/2020.     Current  Diagnosis/Assessment:  Goals Addressed            This Visit's Progress   . Chronic Care Management Pharmacy Care Plan       CARE PLAN ENTRY (see longitudinal plan of care for additional care plan information)  Current Barriers:  . Chronic Disease Management support, education, and care coordination needs related to Hypertension, Hyperlipidemia/Aortic Atherosclerosis, Hypothyroidism, Emphysema, Cervical Stenosis, Neuropathy, Cystoid Macular Edema, Overactive Bladder   Hypertension BP Readings from Last 3 Encounters:  07/15/20 140/68  07/10/20 123/64  07/08/20 (!) 101/47   . Pharmacist Clinical Goal(s): o Over the next 90 days, patient will work with PharmD and providers to maintain BP goal <130/80 . Current regimen:  . Hydralazine 66m twice daily . Losartan 1044mdaily  . Interventions: o Requested patient to check BP 2-3 times per week and document . Patient self care activities - Over the next 90 days, patient will: o Check BP 2-3 times per week, document, and provide at future appointments o Ensure daily salt intake < 2300 mg/[name] Pratt  Hyperlipidemia/Hx of Aortic Atherosclerosis Lab Results  Component Value Date/Time   LDLCALC 50 06/15/2020 12:35 PM   . Pharmacist Clinical Goal(s): o Over the next 90 days, patient will work with PharmD and providers to maintain LDL goal < 70 and reduce risk of bill burden . Current regimen:  . Rosuvastatin 41m541maily . Fenofibrate 1441m59mily . Fish Oil 1200mg541mly . Nitroglycerin 0.4mg a68meeded . Interventions: o Collaboration with provider regarding medication management (consider discontinuation of fenofibrate) . Patient self care activities - Over the next 90 days, patient will: o Maintain cholesterol medication regimen  Medication management . Pharmacist Clinical Goal(s): o Over the next 90 days, patient will work with PharmD and providers to maintain optimal medication adherence . Current pharmacy:  [facility] . Interventions o Comprehensive medication review performed. o Continue current medication management strategy . Patient self care activities - Over the next 90 days, patient will: o Focus on medication adherence by filling and taking medications appropriately  o Take medications as prescribed o Report any questions or concerns to PharmD and/or provider(s)  Initial goal documentation       Social Hx:  Has a man cave upstairs. Has a wood shop out back Married 60 years.  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    Hypertension   BP goal is:  <130/80  Office blood pressures are  BP Readings from Last 3 Encounters:  07/15/20 140/68  07/10/20 123/64  07/08/20 (!) 101/47   Patient checks BP at home infrequently Patient home BP readings are ranging: Unable to assess  Patient has failed these meds in the past: metoprolol (listed in D/C meds. No apparent reason for D/C) Patient is currently controlled on the following medications:  . Hydralazine 50mg t67m daily . Losartan 100mg da41m(supposed to be on 50mg?)  641m63 in the evening yesterday Has rarely felt dizzy since his BP  regimen was decreased  Plan -Consider checking BP 2-3 times per week and record    Hyperlipidemia/Aortic Atherosclerosis   LDL goal < 70  Last lipids Lab Results  Component Value Date   CHOL 106 06/15/2020   HDL 38 (L) 06/15/2020   LDLCALC 50 06/15/2020   TRIG 98 06/15/2020   CHOLHDL 2.8 06/15/2020   Hepatic Function Latest Ref Rng & Units 06/15/2020 03/03/2020 09/19/2019  Total Protein 6.1 - 8.1 g/dL 7.1 7.9 7.3  Albumin 3.5 - 5.0 g/dL - 4.2 4.4  AST 10 - 35 U/L 28 46(H) 24  ALT 9 - 46 U/L _0 Alk Phosphatase 38 - 126 U/L - 41 47  Total Bilirubin 0.2 - 1.2 mg/dL 0.5 0.5 0.6  Bilirubin, Direct 0.0 - 0.3 mg/dL - - -     The ASCVD Risk score (Williams., et al., 2013)  failed to calculate for the following reasons:   The 2013 ASCVD risk score is only valid for ages 71 to 56   Patient has failed these meds in past: pravastatin (muscle cramps), simvastatin (itxn with gemfibrozil) Patient is currently controlled on the following medications:  . Rosuvastatin 43m daily . Fenofibrate 1432mdaily . Fish Oil 120062maily . Nitroglycerin 0.4mg80m needed  Has taken NTG once about 6 months ago He can usually resolve chest pain with deep breaths  Plan -Consider D/C fenofibrate due to lack of mortality benefit and TG <150 over the last 3 years. Will consult with PCP   Hypothyroidism   Lab Results  Component Value Date/Time   TSH 1.01 06/15/2020 12:35 PM   TSH 1.38 02/20/2020 03:55 PM    Patient has failed these meds in past: None noted  Patient is currently controlled on the following medications:  . Levothyroxine 150mc60mily  Plan -Continue current medications   Emphysema / Tobacco   Last spirometry score: 12/01/2016 FVC: 3.62 (103%)  FEV1: 2.67 (106%) FEV1/FVC: 74% "Minimal airway obstruction is present suggesting small airway disease."  Eosinophil count:   Lab Results  Component Value Date/Time   EOSPCT 0 03/03/2020 10:32 PM  %                               Eos (Absolute):  Lab Results  Component Value Date/Time   EOSABS 0.0 03/03/2020 10:32 PM    Tobacco Status:  Social History   Tobacco Use  Smoking Status Former Smoker  . Years: 11.00  . Types: Cigarettes  . Quit date: 09/11/1978  . Years since quitting: 41.9  Smokeless Tobacco Never Used    Patient has failed these meds in past: symbicortt (inefficacy?) Patient is currently controlled on the following medications:   Albuterol HFA 2 puffs every 6 hours as needed Using maintenance inhaler regularly? No (not prescribed) Frequency of rescue inhaler use:  infrequently (once every 3-4 months)  Plan -Continue current medications  Cervical Stenosis    Patient has failed  these meds in past: None noted  Patient is currently uncontrolled on the following medications: . None  Right leg improved since surgery Has a walker at home, but doesn't use as often Realizes he will need PT again  Plan -Consider PT  Neuropathy    Patient has failed these meds in past: None noted  Patient is currently controlled on the following medications: . Gabapentin 300mg 75mtimes per Lyndsy Gilberto as needed  (only uses once daily HS)  Higher doses  of gabapentin made him loopy and angry at his wife Uses to help with cramping at bedtime  Plan -Continue current medications   Cystoid Macular Edema   Followed by Dr. Flavia Shipper  Patient has failed these meds in past: None noted  Patient is currently controlled on the following medications: . Restasis twice daily  Was told he had a tear in his retina, but was told that it was scar tissue due to lack of oxygen, so now he uses a CPAP  Plan -Continue current medications   Overactive Bladder   Followed by Urology  Patient has failed these meds in past: None noted  Patient is currently queri controlled on the following medications: [name] Myrbetriq 42m daily  He is unsure if this is working.  He states he isn't urinating over night.  He was on a higher dose, but he didn't tolerate this well.  Pays $110/30DS  Plan -Follow up with urology regarding medication regimen regarding urge incontinence   Vaccines   Reviewed and discussed patient's vaccination history.   Pt UTD on vaccines  Immunization History  Administered Date(s) Administered  . Fluad Quad(high Dose 65+) 05/29/2019, 05/28/2020  . Influenza Split 05/17/2012  . Influenza, High Dose Seasonal PF 06/19/2016, 06/20/2017, 06/17/2018  . Influenza,inj,Quad PF,6+ Mos 05/26/2013, 06/03/2014, 05/28/2015  . PFIZER SARS-COV-2 Vaccination 10/01/2019, 10/22/2019, 06/15/2020  . Pneumococcal Conjugate-13 08/25/2013  . Pneumococcal Polysaccharide-23 09/11/2008  . Tdap 12/27/2010   . Zoster 07/10/2008  . Zoster Recombinat (Shingrix) 07/01/2018, 09/19/2018    Medication Management   [facility] Uses pill box? No - Can't divide out medicine appropriately with the pill boxes  Miscellaneous Meds Albuterol - last used about a month ago Voltaren - PRN We discussed: Discussed benefits of medication synchronization, packaging and delivery as well as enhanced pharmacist oversight with [facility]. Pt would like to continue his current regimen.  Plan  Continue current medication management strategy  Follow up:  1 month BP check in 3 month phone visit   KDe Blanch PharmD Clinical Pharmacist [phone]

## 2020-07-20 ENCOUNTER — Other Ambulatory Visit: Payer: Self-pay

## 2020-07-20 DIAGNOSIS — I10 Essential (primary) hypertension: Secondary | ICD-10-CM

## 2020-07-20 DIAGNOSIS — E039 Hypothyroidism, unspecified: Secondary | ICD-10-CM

## 2020-07-20 DIAGNOSIS — E1169 Type 2 diabetes mellitus with other specified complication: Secondary | ICD-10-CM

## 2020-07-28 ENCOUNTER — Ambulatory Visit (INDEPENDENT_AMBULATORY_CARE_PROVIDER_SITE_OTHER): Payer: Medicare HMO | Admitting: Ophthalmology

## 2020-07-28 ENCOUNTER — Encounter (INDEPENDENT_AMBULATORY_CARE_PROVIDER_SITE_OTHER): Payer: Self-pay | Admitting: Ophthalmology

## 2020-07-28 ENCOUNTER — Other Ambulatory Visit: Payer: Self-pay

## 2020-07-28 DIAGNOSIS — G4733 Obstructive sleep apnea (adult) (pediatric): Secondary | ICD-10-CM

## 2020-07-28 DIAGNOSIS — H35353 Cystoid macular degeneration, bilateral: Secondary | ICD-10-CM

## 2020-07-28 DIAGNOSIS — H35073 Retinal telangiectasis, bilateral: Secondary | ICD-10-CM | POA: Diagnosis not present

## 2020-07-28 DIAGNOSIS — Z9989 Dependence on other enabling machines and devices: Secondary | ICD-10-CM | POA: Diagnosis not present

## 2020-07-28 HISTORY — DX: Cystoid macular degeneration, bilateral: H35.353

## 2020-07-28 NOTE — Assessment & Plan Note (Addendum)
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

## 2020-07-28 NOTE — Assessment & Plan Note (Signed)
Patient very compliant with CPAP use, ongoing treatment required for MAC-TEL

## 2020-07-28 NOTE — Progress Notes (Signed)
07/28/2020     CHIEF COMPLAINT Patient presents for Cystoid Macular Edema   HISTORY OF PRESENT ILLNESS: [name] Pratt is a 79 y.o. male who presents to the clinic today for:   HPI    1 YR F/U OU   Pt reports stable vision, no new flashes or floaters.    Last edited by Nichola Sizer D on 07/28/2020 10:03 AM. (History)      Referring physician: [name], NP [address] [place],  [place] [address]  HISTORICAL INFORMATION:   Selected notes from the MEDICAL RECORD NUMBER    Lab Results  Component Value Date   HGBA1C 5.6 09/19/2019     CURRENT MEDICATIONS: Current Outpatient Medications (Ophthalmic Drugs)  Medication Sig  . cycloSPORINE (RESTASIS) 0.05 % ophthalmic emulsion Place 1 drop into both eyes 2 (two) times daily.  . Artificial Tear Solution (SOOTHE XP OP) Place 1 drop into both eyes daily.   No current facility-administered medications for this visit. (Ophthalmic Drugs)   Current Outpatient Medications (Other)  Medication Sig  . acetaminophen (TYLENOL) 650 MG CR tablet Take 1,300 mg by mouth every 8 (eight) hours as needed for pain.  [name] albuterol (VENTOLIN HFA) 108 (90 Base) MCG/ACT inhaler Inhale 2 puffs into the lungs every 6 (six) hours as needed for shortness of breath.  . allopurinol (ZYLOPRIM) 100 MG tablet TAKE 1 TABLET TWICE DAILY (Patient taking differently: Take 100 mg by mouth 2 (two) times daily. )  . aspirin EC 81 MG tablet Take 81 mg by mouth every morning.  . cholecalciferol (VITAMIN D3) 25 MCG (1000 UNIT) tablet Take 1,000 Units by mouth daily.  . Cranberry 500 MG TABS Take 500 mg by mouth daily.   . diclofenac sodium (VOLTAREN) 1 % GEL Apply 1 application topically daily as needed (arthritis).  [name] docusate sodium (COLACE) 100 MG capsule Take 1 capsule (100 mg total) by mouth 2 (two) times daily as needed for mild constipation. (Patient taking differently: Take 100 mg by mouth daily. )  . fenofibrate (TRICOR)  145 MG tablet TAKE 1 TABLET EVERY DAY (Patient taking differently: Take 145 mg by mouth daily. )  . ferrous sulfate 325 (65 FE) MG tablet Take 325 mg by mouth daily with breakfast.  . gabapentin (NEURONTIN) 300 MG capsule TAKE 1 CAPSULE TWO TO THREE TIMES DAILY AS NEEDED (Patient taking differently: Take 300 mg by mouth 2 (two) times daily as needed (pain). )  . Homeopathic Products (THERAWORX RELIEF) FOAM Apply 1 application topically at bedtime.  . hydrALAZINE (APRESOLINE) 50 MG tablet Take 1 tablet (50 mg total) by mouth in the morning and at bedtime. (Patient not taking: Reported on 06/30/2020)  . levothyroxine (SYNTHROID) 150 MCG tablet TAKE 1 TABLET ONE TIME DAILY ON 6 DAYS PER WEEK AND 1/2 TABLET ON 1 DAY PER WEEK (Patient taking differently: Take 75-150 mcg by mouth See admin instructions. Take 75 mcg on Sundays, take 150 mcg daily Mon - Sat)  . losartan (COZAAR) 100 MG tablet Take 100 mg by mouth daily.  . Menthol, Topical Analgesic, (ZIMS MAX-FREEZE EX) Apply 1 application topically daily as needed (pain).  . multivitamin (THERAGRAN) per tablet Take 1 tablet by mouth daily.    [name] MYRBETRIQ 25 MG TB24 tablet Take 1 tablet (25 mg total) by mouth daily.  . nitroGLYCERIN (NITROSTAT) 0.4 MG SL tablet Place 1 tablet (0.4 mg total) under the tongue every 5 (five) minutes as needed for chest pain.  [name]  Omega-3 Fatty Acids (FISH OIL) 1200 MG CAPS Take 1,200 mg by mouth daily.  . rosuvastatin (CRESTOR) 5 MG tablet Take 1 tablet by mouth once daily (Patient taking differently: Take 5 mg by mouth daily. )  . sodium chloride (OCEAN) 0.65 % nasal spray Place 1 spray into both nostrils at bedtime as needed for congestion.   No current facility-administered medications for this visit. (Other)      REVIEW OF SYSTEMS:    ALLERGIES Allergies  Allergen Reactions  . Elastic Bandages & [Zinc] Other (See Comments)    Teflon bandages - Irritation  . Pravastatin     Muscle cramps  . Sildenafil      Tachycardia    . Metoclopramide Hcl Anxiety    PAST MEDICAL HISTORY Past Medical History:  Diagnosis Date  . Aortic atherosclerosis ([place]) 03/05/2020  . Arthritis   . Cancer ([place])    skin cancer  . Carotid stenosis    a. Carotid U/S 7/06: LICA < 23%, RICA 76-28%;  b.  Carotid U/S 3/15:  RICA 17-61%; LICA 6-07%; f/u 1 year  . Chest pain     Myoview in 2008 was normal.  Echo 7/09: EF 60%, normal wall motion, mild LVH, mild LAE.  [name] Complication of anesthesia    "stomach does not wake up"  . COPD (chronic obstructive pulmonary disease) ([place])   . Emphysema, unspecified ([place]) 03/05/2020  . GERD (gastroesophageal reflux disease)   . Gout   . History of chicken pox   . History of hemorrhoids   . History of kidney stones   . Hyperlipidemia    under control  . Hypertension    under control  . Hypothyroidism   . Ileus ([place])   . Inguinal hernia   . OSA (obstructive sleep apnea)   . Pancreatitis   . PONV (postoperative nausea and vomiting)   . Rosacea   . 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   Past Surgical History:  Procedure Laterality Date  . ABDOMINAL HERNIA REPAIR  07/15/12   Dr [name]  . ANTERIOR CERVICAL DECOMP/DISCECTOMY FUSION N/A 07/09/2020   Procedure: ANTERIOR CERVICAL DECOMPRESSION AND FUSION CERVICAL THREE-FOUR.;  Surgeon: [name], MD;  Location: [facility];  Service: Neurosurgery;  Laterality: N/A;  anterior  . BROW LIFT  05/07/01  . CYSTOSCOPY WITH URETEROSCOPY AND STENT PLACEMENT Right 01/28/2014   Procedure: CYSTOSCOPY WITH URETEROSCOPY, BASKET RETRIVAL AND  STENT PLACEMENT;  Surgeon: [name], MD;  Location: [facility];  Service: Urology;  Laterality: Right;  . epidural injections     multiple procedures  . [place]   multiple times  . EYE SURGERY Bilateral 03/25/10, 2012   cataract removal  . HEMORRHOID SURGERY  2014  . HIATAL HERNIA REPAIR  12/21/93  . HOLMIUM LASER APPLICATION Right  3/71/0626   Procedure: HOLMIUM LASER APPLICATION;  Surgeon: [name], MD;  Location: [facility];  Service: Urology;  Laterality: Right;  . INGUINAL HERNIA REPAIR Right 07/15/12   Dr [name], x2  . JOINT REPLACEMENT  07/25/04   right knee  . JOINT REPLACEMENT  07/13/10   left knee  . KNEE SURGERY  1995  . LAPAROSCOPIC CHOLECYSTECTOMY  09/20/06   with intraoperative cholangiogram and right inguinal herniorrhaphy with mesh   . LIGAMENT REPAIR Left 07/2013   shoulder  . LITHOTRIPSY     x2  . NASAL SEPTUM SURGERY  1975  . REFRACTIVE SURGERY  Bilateral   . 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 SKIN CANCER DESTRUCTION     nose, ear  . TONSILLECTOMY  as child  . TOTAL HIP ARTHROPLASTY Left   . URETHRAL DILATION  1991   [name]    FAMILY HISTORY Family History  Problem Relation Age of Onset  . Cancer Mother   . Hypertension Other   . Cancer Other   . Osteoarthritis Other     SOCIAL HISTORY Social History   Tobacco Use  . Smoking status: Former Smoker    Years: 11.00    Types: Cigarettes    Quit date: 09/11/1978    Years since quitting: 41.9  . Smokeless tobacco: Never Used  Vaping Use  . Vaping Use: Never used  Substance Use Topics  . Alcohol use: Yes    Alcohol/week: 0.0 standard drinks    Comment: rare  . Drug use: No         OPHTHALMIC EXAM:  Base Eye Exam    Visual Acuity (ETDRS)      Right Left   Dist [place] 20/60 -2 20/30   Dist ph [place] NI 20/30 +2       Tonometry (Tonopen, 10:11 AM)      Right Left   Pressure 11 7       Pupils      Pupils Dark Light Shape React APD   Right PERRL 2 2 Round miotic None   Left PERRL 2 2 Round miotic None       Visual Fields (Counting fingers)      Left Right    Full Full       Extraocular Movement      Right Left    Full Full       Neuro/Psych    Oriented x3: Yes       Dilation    Both eyes: 1.0% Mydriacyl, 2.5% Phenylephrine @ 10:12 AM        Slit Lamp and Fundus Exam    External Exam      Right Left    External Normal Normal       Slit Lamp Exam      Right Left   Lids/Lashes Normal Normal   Conjunctiva/Sclera White and quiet White and quiet   Cornea Clear Clear   Anterior Chamber Deep and quiet Deep and quiet   [place] PI PI   Lens Clear Clear   Anterior Vitreous Normal Normal       Fundus Exam      Right Left   Posterior Vitreous Posterior vitreous detachment Posterior vitreous detachment   Disc Normal Normal   C/D Ratio 0.5 0.5   Macula No overt CME, angulated vessels temporally No overt CME, angulated vessels temporally   Vessels Normal Normal   Periphery Normal Normal          IMAGING AND PROCEDURES  Imaging and Procedures for 07/28/20  OCT, Retina - OU - Both Eyes       Right Eye Quality was good. Central Foveal Thickness: 269. Progression has been stable. Findings include abnormal foveal contour, cystoid macular edema.   Left Eye Quality was good. Scan locations included subfoveal. Central Foveal Thickness: 227. Progression has improved. Findings include abnormal foveal contour.   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 Cystoid macular edema  of both eyes  H35.353   3. OSA on CPAP  G47.33    Z99.89     1.  No interval therapy required at this time OU  2.  3.  Ophthalmic Meds Ordered this visit:  No orders of the defined types were placed in this encounter.      Return in about 6 months (around 01/25/2021) for DILATE OU, COLOR FP, OPTOS FFA L/R.  There are no Patient Instructions on file for this visit.   Explained the diagnoses, plan, and follow up with the patient and they expressed understanding.  Patient expressed understanding of the importance of proper follow up care.   Clent Demark Conrado Nance M.D. Diseases & Surgery of the Retina and Vitreous [facility] [place] 07/28/20     Abbreviations: M myopia (nearsighted); A astigmatism; H hyperopia (farsighted); P presbyopia; Mrx spectacle prescription;  CTL contact lenses; OD right eye; OS left eye; OU both eyes  XT exotropia; ET esotropia; PEK punctate epithelial keratitis; PEE punctate epithelial erosions; DES dry eye syndrome; MGD meibomian gland dysfunction; ATs artificial tears; PFAT's preservative free artificial tears; [place] nuclear sclerotic cataract; PSC posterior subcapsular cataract; ERM epi-retinal membrane; PVD posterior vitreous detachment; RD retinal detachment; DM diabetes mellitus; DR diabetic retinopathy; NPDR non-proliferative diabetic retinopathy; PDR proliferative diabetic retinopathy; CSME clinically significant macular edema; DME diabetic macular edema; dbh dot blot hemorrhages; CWS cotton wool spot; POAG primary open angle glaucoma; C/D cup-to-disc ratio; HVF humphrey visual field; GVF goldmann visual field; OCT optical coherence tomography; IOP intraocular pressure; BRVO Branch retinal vein occlusion; CRVO central retinal vein occlusion; CRAO central retinal artery occlusion; BRAO branch retinal artery occlusion; RT retinal tear; SB scleral buckle; PPV pars plana vitrectomy; VH Vitreous hemorrhage; PRP panretinal laser photocoagulation; IVK  intravitreal kenalog; VMT vitreomacular traction; MH Macular hole;  NVD neovascularization of the disc; NVE neovascularization elsewhere; AREDS age related eye disease study; ARMD age related macular degeneration; POAG primary open angle glaucoma; EBMD epithelial/anterior basement membrane dystrophy; ACIOL anterior chamber intraocular lens; IOL intraocular lens; PCIOL posterior chamber intraocular lens; Phaco/IOL phacoemulsification with intraocular lens placement; [place] photorefractive keratectomy; LASIK laser assisted in situ keratomileusis; HTN hypertension; DM diabetes mellitus; COPD chronic obstructive pulmonary disease

## 2020-07-28 NOTE — Assessment & Plan Note (Signed)
OU these are chronic, stable since patient has macular telangiectasis and is on CPAP with excellent compliance

## 2020-08-01 NOTE — Patient Instructions (Addendum)
Visit Information  Goals Addressed            This Visit's Progress   . Chronic Care Management Pharmacy Care Plan       CARE PLAN ENTRY (see longitudinal plan of care for additional care plan information)  Current Barriers:  . Chronic Disease Management support, education, and care coordination needs related to Hypertension, Hyperlipidemia/Aortic Atherosclerosis, Hypothyroidism, Emphysema, Cervical Stenosis, Neuropathy, Cystoid Macular Edema, Overactive Bladder   Hypertension BP Readings from Last 3 Encounters:  07/15/20 140/68  07/10/20 123/64  07/08/20 (!) 101/47   . Pharmacist Clinical Goal(s): o Over the next 90 days, patient will work with PharmD and providers to maintain BP goal <130/80 . Current regimen:  . Hydralazine 50mg  twice daily . Losartan 100mg  daily  . Interventions: o Requested patient to check BP 2-3 times per week and document . Patient self care activities - Over the next 90 days, patient will: o Check BP 2-3 times per week, document, and provide at future appointments o Ensure daily salt intake < 2300 mg/Ej Pinson  Hyperlipidemia/Hx of Aortic Atherosclerosis Lab Results  Component Value Date/Time   LDLCALC 50 06/15/2020 12:35 PM   . Pharmacist Clinical Goal(s): o Over the next 90 days, patient will work with PharmD and providers to maintain LDL goal < 70 and reduce risk of bill burden . Current regimen:  . Rosuvastatin 5mg  daily . Fenofibrate 145mg  daily . Fish Oil 1200mg  daily . Nitroglycerin 0.4mg  as needed . Interventions: o Collaboration with provider regarding medication management (consider discontinuation of fenofibrate) . Patient self care activities - Over the next 90 days, patient will: o Maintain cholesterol medication regimen  Medication management . Pharmacist Clinical Goal(s): o Over the next 90 days, patient will work with PharmD and providers to maintain optimal medication adherence . Current pharmacy:  [facility] . Interventions o Comprehensive medication review performed. o Continue current medication management strategy . Patient self care activities - Over the next 90 days, patient will: o Focus on medication adherence by filling and taking medications appropriately  o Take medications as prescribed o Report any questions or concerns to PharmD and/or provider(s)  Initial goal documentation        [name] was given information about Chronic Care Management services today including:  1. CCM service includes personalized support from designated clinical staff supervised by his physician, including individualized plan of care and coordination with other care providers 2. 24/7 contact phone numbers for assistance for urgent and routine care needs. 3. Standard insurance, coinsurance, copays and deductibles apply for chronic care management only during months in which we provide at least 20 minutes of these services. Most insurances cover these services at 100%, however patients may be responsible for any copay, coinsurance and/or deductible if applicable. This service may help you avoid the need for more expensive face-to-face services. 4. Only one practitioner may furnish and bill the service in a calendar month. 5. The patient may stop CCM services at any time (effective at the end of the month) by phone call to the office staff.  Patient agreed to services and verbal consent obtained.   The patient verbalized understanding of instructions, educational materials, and care plan provided today and agreed to receive a mailed copy of patient instructions, educational materials, and care plan.  Telephone follow up appointment with pharmacy team member scheduled for:  10/15/2020  Melvenia Beam Lise Pincus, PharmD Clinical Pharmacist [facility] [phone]

## 2020-08-02 ENCOUNTER — Ambulatory Visit (INDEPENDENT_AMBULATORY_CARE_PROVIDER_SITE_OTHER): Payer: Medicare HMO | Admitting: Rehabilitative and Restorative Service Providers"

## 2020-08-02 ENCOUNTER — Other Ambulatory Visit: Payer: Self-pay

## 2020-08-02 ENCOUNTER — Ambulatory Visit: Payer: Self-pay | Admitting: Pharmacist

## 2020-08-02 ENCOUNTER — Other Ambulatory Visit: Payer: Self-pay | Admitting: Family

## 2020-08-02 DIAGNOSIS — M6281 Muscle weakness (generalized): Secondary | ICD-10-CM

## 2020-08-02 DIAGNOSIS — R2689 Other abnormalities of gait and mobility: Secondary | ICD-10-CM | POA: Diagnosis not present

## 2020-08-02 DIAGNOSIS — R2681 Unsteadiness on feet: Secondary | ICD-10-CM | POA: Diagnosis not present

## 2020-08-02 DIAGNOSIS — R29818 Other symptoms and signs involving the nervous system: Secondary | ICD-10-CM | POA: Diagnosis not present

## 2020-08-02 NOTE — Therapy (Signed)
[place], [place], [address] Phone: [phone]   Fax:  [phone]  Physical Therapy Evaluation  Patient Details  Name: [name] Pratt MRN: [id number] Date of Birth: 07-Mar-1941 Referring Provider (PT): [name], MD   Encounter Date: 08/02/2020   PT End of Session - 08/02/20 2148    Visit Number 1    Number of Visits 12    Date for PT Re-Evaluation 09/13/20    Authorization Type [name]    PT Start Time 1350    PT Stop Time 1440    PT Time Calculation (min) 50 min    Equipment Utilized During Treatment Gait belt    Activity Tolerance Patient tolerated treatment well    Behavior During Therapy [facility] for tasks assessed/performed           Past Medical History:  Diagnosis Date  . Aortic atherosclerosis ([place]) 03/05/2020  . Arthritis   . Cancer ([place])    skin cancer  . Carotid stenosis    a. Carotid U/S 3/66: LICA < 44%, RICA 03-47%;  b.  Carotid U/S 4/25:  RICA 95-63%; LICA 8-75%; f/u 1 year  . Chest pain     Myoview in 2008 was normal.  Echo 7/09: EF 60%, normal wall motion, mild LVH, mild LAE.  [name] Complication of anesthesia    "stomach does not wake up"  . COPD (chronic obstructive pulmonary disease) ([place])   . Emphysema, unspecified ([place]) 03/05/2020  . GERD (gastroesophageal reflux disease)   . Gout   . History of chicken pox   . History of hemorrhoids   . History of kidney stones   . Hyperlipidemia    under control  . Hypertension    under control  . Hypothyroidism   . Ileus ([place])   . Inguinal hernia   . OSA (obstructive sleep apnea)   . Pancreatitis   . PONV (postoperative nausea and vomiting)   . Rosacea   . 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    Past Surgical History:  Procedure Laterality Date  . ABDOMINAL HERNIA REPAIR  07/15/12   Dr [name]  . ANTERIOR CERVICAL DECOMP/DISCECTOMY FUSION N/A 07/09/2020    Procedure: ANTERIOR CERVICAL DECOMPRESSION AND FUSION CERVICAL THREE-FOUR.;  Surgeon: [name], MD;  Location: [place];  Service: Neurosurgery;  Laterality: N/A;  anterior  . BROW LIFT  05/07/01  . CYSTOSCOPY WITH URETEROSCOPY AND STENT PLACEMENT Right 01/28/2014   Procedure: CYSTOSCOPY WITH URETEROSCOPY, BASKET RETRIVAL AND  STENT PLACEMENT;  Surgeon: [name], MD;  Location: [facility];  Service: Urology;  Laterality: Right;  . epidural injections     multiple procedures  . [place]   multiple times  . EYE SURGERY Bilateral 03/25/10, 2012   cataract removal  . HEMORRHOID SURGERY  2014  . HIATAL HERNIA REPAIR  12/21/93  . HOLMIUM LASER APPLICATION Right 6/43/3295   Procedure: HOLMIUM LASER APPLICATION;  Surgeon: [name], MD;  Location: [facility];  Service: Urology;  Laterality: Right;  . INGUINAL HERNIA REPAIR Right 07/15/12   Dr [name], x2  . JOINT REPLACEMENT  07/25/04   right knee  . JOINT REPLACEMENT  07/13/10   left knee  . KNEE SURGERY  1995  . LAPAROSCOPIC CHOLECYSTECTOMY  09/20/06   with intraoperative cholangiogram and right inguinal herniorrhaphy with mesh   . LIGAMENT REPAIR Left 07/2013   shoulder  .  LITHOTRIPSY     x2  . NASAL SEPTUM SURGERY  1975  . REFRACTIVE SURGERY Bilateral   . 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 SKIN CANCER DESTRUCTION     nose, ear  . TONSILLECTOMY  as child  . TOTAL HIP ARTHROPLASTY Left   . URETHRAL DILATION  1991   [name]    There were no vitals filed for this visit.    Subjective Assessment - 08/02/20 1355    Subjective The patient is s/p cervical discectomy and fusion C3-C4 due to severe stenosis  on 07/09/20.  He reports improved hand function.  He notes he is able to do therapy for walking, but not on the neck.  He reports his knee on the right side is popping forward and back limiting his walking.    Pertinent History neuropathy, HTN, lumbar DDD, hypothyroidism, carotid stenosis    Patient Stated  Goals "I want my leg to get better and walk without limping."    Currently in Pain? Yes    Pain Score --   pain with walking   Pain Location Knee    Pain Orientation Right;Posterior    Pain Type Chronic pain    Pain Onset More than a month ago    Pain Frequency Intermittent    Aggravating Factors  worse with walking; after hip stopped hurting after neck surgery (the knee began popping)    Pain Relieving Factors rest              [facility] PT Assessment - 08/02/20 1401      Assessment   Medical Diagnosis s/p cervical myelopathy, gait training focus only    Referring Provider (PT) [name], MD    Onset Date/Surgical Date 07/09/20    Hand Dominance Right    Prior Therapy known to me from prior PT in 01/2020      Precautions   Precautions Fall      Restrictions   Weight Bearing Restrictions No      Balance Screen   Has the patient fallen in the past 6 months No    Has the patient had a decrease in activity level because of a fear of falling?  No   knee pops back; walks sideways up hill   Is the patient reluctant to leave their home because of a fear of falling?  No      Home Environment   Living Environment Private residence    Living Arrangements Spouse/significant other    Type of [employer] to enter    Entrance Stairs-Number of Steps 8    Entrance Stairs-Rails Can reach both    Home Layout Two level    Alternate Level Stairs-Number of Steps [place] None      Prior Function   Level of Independence Independent      Sensation   Light Touch Impaired Detail   numbness in hands, some arm pain at night, tightness in feet     ROM / Strength   AROM / PROM / Strength AROM;Strength      AROM   Overall AROM  --    Overall AROM Comments tightness noted per observation of gait      Strength   Overall Strength Deficits    Strength Assessment Site Hip;Knee;Ankle    Right/Left Hip Right;Left    Right Hip Flexion 3/5    Right Hip ABduction  3/5    Left  Hip Flexion 4/5    Left Hip ABduction 4/5    Right/Left Knee Right;Left    Right Knee Flexion 3/5    Right Knee Extension 4/5    Left Knee Flexion 4+/5    Left Knee Extension 5/5    Right/Left Ankle Right;Left    Right Ankle Dorsiflexion 4/5    Left Ankle Dorsiflexion 3/5      Flexibility   Soft Tissue Assessment /Muscle Length yes    Hamstrings mild tightness R side/ pain in R gastrocs      Ambulation/Gait   Ambulation/Gait Yes    Ambulation/Gait Assistance 6: Modified independent (Device/Increase time)   slowed pace   Ambulation Distance (Feet) 150 Feet    Ambulation Surface Level;Indoor    Gait Comments R knee recurvatum every step; improved with cues to shorten stride length.      High Level Balance   High Level Balance Comments unable to perform SLS                      Objective measurements completed on examination: See above findings.       [place] Adult PT Treatment/Exercise - 08/02/20 1401      Ambulation/Gait   Assistive device None    Gait Pattern Step-through pattern;Right genu recurvatum;Lateral hip instability;Trunk flexed   significant variability in step length   Gait velocity 2.01 ft/sec      Exercises   Exercises Knee/Hip      Knee/Hip Exercises: Stretches   Other Knee/Hip Stretches butterfly stretch supine      Knee/Hip Exercises: Supine   Bridges Strengthening;5 reps    Bridges Limitations hamstring cramps    Straight Leg Raises Right;5 reps    Straight Leg Raises Limitations with some HS stretch      Knee/Hip Exercises: Sidelying   Clams 10 reps R side                  PT Education - 08/02/20 2146    Education Details HEP    Person(s) Educated Patient    Methods Explanation;Demonstration;Handout    Comprehension Verbalized understanding;Returned demonstration               PT Long Term Goals - 08/02/20 2149      PT LONG TERM GOAL #1   Title The patient will be indep with HEP.    Time 6     Period Weeks    Target Date 09/13/20      PT LONG TERM GOAL #2   Title The patient will improve R hip abduction to 4/5.    Time 6    Period Weeks    Target Date 09/13/20      PT LONG TERM GOAL #3   Title The patient will improve gait speed from 2.02 ft/sec to > or equal to 2.62 ft/sec to demo improved community moiblity.    Time 6    Period Weeks    Target Date 09/13/20      PT LONG TERM GOAL #4   Title The patient will maintain single leg stance x 5 seconds bilat.    Time 6    Period Weeks    Target Date 09/13/20      PT LONG TERM GOAL #5   Title The patient will return demo floor <> stand with one UE support on surface to rise.    Time 6    Period Weeks    Target Date 09/13/20  Additional Long Term Goals   Additional Long Term Goals Yes      PT LONG TERM GOAL #6   Title The patient will demonstrate improved knee control demonstrating recurbatum < 25% of gait x 200 ft.    Time 6    Period Weeks    Target Date 09/13/20                  Plan - 08/02/20 2154    Clinical Impression Statement The patient is an 79 yo male s/p c-spine surgery due to myelopathy.  He presents to OP rehab with R knee recurvatum with gait, dec'd balance, abnormality of gait, muscle weakness R hip/knee and L ankle, dec'd dexterity in hands.  PT to address deficits working on promoting safety with mobility and optimizing current functional status.    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    Clinical Decision Making Moderate    Rehab Potential Good    PT Frequency 2x / week    PT Duration 6 weeks    PT Treatment/Interventions ADLs/Self Care Home Management;Gait training;Stair  training;Functional mobility training;Therapeutic activities;Therapeutic exercise;Balance training;Neuromuscular re-education;Manual techniques;Patient/family education;Taping;Electrical Stimulation;Orthotic Fit/Training    PT Next Visit Plan R knee control activities, strengthening LEs, standing balance, consider AFO for knee control as needed    PT Home Exercise Plan Access Code: [email or web address]    Consulted and Agree with Plan of Care Patient           Patient will benefit from skilled therapeutic intervention in order to improve the following deficits and impairments:  Abnormal gait, Impaired flexibility, Decreased strength, Difficulty walking, Decreased activity tolerance, Decreased balance, Impaired sensation  Visit Diagnosis: Muscle weakness (generalized)  Other abnormalities of gait and mobility  Other symptoms and signs involving the nervous system  Unsteadiness on feet     Problem List Patient Active Problem List   Diagnosis Date Noted  . Cystoid macular edema of both eyes 07/28/2020  . Status post cervical spinal fusion 07/09/2020  . History of total bilateral knee replacement 07/08/2020  . Aortic atherosclerosis ([place]) 03/05/2020  . Emphysema, unspecified ([place]) 03/05/2020  . SIRS (systemic inflammatory response syndrome) ([place]) 03/04/2020  . Heme positive stool 02/29/2020  . Leg cramps 11/13/2019  . Tibialis anterior tendon tear, nontraumatic 11/13/2019  . 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 CPAP (continuous positive airway pressure) dependence 03/25/2018  . 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 Acute idiopathic gout of hand 03/25/2018  . Erectile dysfunction 06/20/2017  . 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 Peripheral neuropathy 06/19/2016  . Hearing loss 02/02/2016  . 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 Allergic rhinitis 02/02/2016  . Atypical chest pain 11/24/2015  . Preventative health care  11/24/2015  . Onychomycosis 06/30/2015  . Degenerative disc disease, lumbar 06/30/2015  . Other allergic rhinitis 02/18/2015  . Deviated nasal septum 02/18/2015  . OSA on CPAP 02/18/2015  . RLS (restless legs syndrome) 02/18/2015  . GERD (gastroesophageal reflux disease) 09/18/2014  . Hyperglycemia 03/03/2014  . Sleep apnea with use of continuous positive airway pressure (CPAP) 07/15/2013  . Carotid stenosis 02/24/2013  . Nonspecific abnormal electrocardiogram (ECG) (EKG) 01/06/2013  . DJD (degenerative joint disease) of hip 12/24/2012  . Lower GI bleed 12/12/2012  . Dyspnea  06/12/2011  . Hypothyroidism 05/13/2009  . HYPERCHOLESTEROLEMIA 05/13/2009  . Hyperlipidemia 05/13/2009  . 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  . Essential hypertension 05/13/2009  . INGUINAL HERNIA 05/13/2009  . HIATAL HERNIA 05/13/2009  . NEPHROLITHIASIS 05/13/2009  . ROSACEA 05/13/2009    Irma Roulhac, PT 08/02/2020, 10:04 PM  [facility] [place], [place], [address] Phone: [phone]   Fax:  [phone]  Name: [name] Pratt MRN: [id number] Date of Birth: [birth year]

## 2020-08-02 NOTE — Progress Notes (Signed)
Hey!  I called the patient and left him a VM. I'll try to call him again later this evening.

## 2020-08-02 NOTE — Patient Instructions (Signed)
Access Code: [email or web address] URL: [email or web address] Date: 08/02/2020 Prepared by: [name]  Exercises Beginner Clam - 2 x daily - 7 x weekly - 1 sets - 10 reps Straight Leg Raise - 2 x daily - 7 x weekly - 1 sets - 10 reps Supine Butterfly Groin Stretch - 2 x daily - 7 x weekly - 1 sets - 3 reps - 30 seconds hold

## 2020-08-02 NOTE — Chronic Care Management (AMB) (Signed)
Called patient to inform him that it was ok for him to discontinue fenofibrate. He will discontinue.  He asks about taking iron noting that it is causing him constipation. Noted his Hgb is low and he most likely should continue iron unless advised to do differently and take a stool softener to help. He states he is taking a stool softener.   Confirmed patient is taking 100mg  daily of losartan.   De Blanch, PharmD Clinical Pharmacist [facility] at [facility] [phone]

## 2020-08-02 NOTE — Chronic Care Management (AMB) (Signed)
Called both numbers listed in patient's chart.  Left VM at his cell number informing him of the ok to D/C fenofibrate and confirm he is taking losartan 100mg  daily.  Encouraged patient to give me a call back to discuss further. Will attempt to call patient before leaving the office this evening to confirm he received my VM and to answer any questions he may have.   De Blanch, PharmD Clinical Pharmacist [facility] at [facility] [phone]

## 2020-08-04 DIAGNOSIS — N5201 Erectile dysfunction due to arterial insufficiency: Secondary | ICD-10-CM | POA: Diagnosis not present

## 2020-08-04 DIAGNOSIS — N3941 Urge incontinence: Secondary | ICD-10-CM | POA: Diagnosis not present

## 2020-08-12 DIAGNOSIS — G959 Disease of spinal cord, unspecified: Secondary | ICD-10-CM | POA: Diagnosis not present

## 2020-08-13 ENCOUNTER — Ambulatory Visit (INDEPENDENT_AMBULATORY_CARE_PROVIDER_SITE_OTHER): Payer: Medicare HMO | Admitting: Physical Therapy

## 2020-08-13 ENCOUNTER — Encounter: Payer: Self-pay | Admitting: Physical Therapy

## 2020-08-13 ENCOUNTER — Other Ambulatory Visit: Payer: Self-pay

## 2020-08-13 DIAGNOSIS — R2689 Other abnormalities of gait and mobility: Secondary | ICD-10-CM | POA: Diagnosis not present

## 2020-08-13 DIAGNOSIS — R29818 Other symptoms and signs involving the nervous system: Secondary | ICD-10-CM | POA: Diagnosis not present

## 2020-08-13 DIAGNOSIS — G4733 Obstructive sleep apnea (adult) (pediatric): Secondary | ICD-10-CM | POA: Diagnosis not present

## 2020-08-13 DIAGNOSIS — M6281 Muscle weakness (generalized): Secondary | ICD-10-CM

## 2020-08-13 NOTE — Patient Instructions (Signed)
Access Code: [email or web address]: [email or web address]: 12/03/2021Prepared by: [place]  Sit to Stand without Arm Support - 2 x daily - 7 x weekly - 1 sets - 5 reps  Beginner Clam - 2 x daily - 7 x weekly - 1 sets - 10 reps  Straight Leg Raise - 2 x daily - 7 x weekly - 1 sets - 5-10 reps  Supine Butterfly Groin Stretch - 2 x daily - 7 x weekly - 1 sets - 3 reps - 30 seconds hold  Beginner Bridge - 1 x daily - 7 x weekly - 2 sets - 5 reps  Supine Piriformis Stretch with Foot on Ground - 1 x daily - 7 x weekly - 1 sets - 2 reps - 30 sec hold

## 2020-08-13 NOTE — Therapy (Signed)
[facility] [place], [place], [address] Phone: [phone]   Fax:  [phone]  Physical Therapy Treatment  Patient Details  Name: [name] Pratt MRN: [id number] Date of Birth: 1940-11-14 Referring Provider (PT): [name], MD   Encounter Date: 08/13/2020   PT End of Session - 08/13/20 0937    Visit Number 2    Number of Visits 12    Date for PT Re-Evaluation 09/13/20    Authorization Type [name]    PT Start Time [phone]    PT Stop Time 1014    PT Time Calculation (min) 38 min    Equipment Utilized During Treatment Gait belt    Activity Tolerance Patient tolerated treatment well    Behavior During Therapy [facility] for tasks assessed/performed           Past Medical History:  Diagnosis Date  . Aortic atherosclerosis ([place]) 03/05/2020  . Arthritis   . Cancer ([place])    skin cancer  . Carotid stenosis    a. Carotid U/S 9/73: LICA < 53%, RICA 29-92%;  b.  Carotid U/S 4/26:  RICA 83-41%; LICA 9-62%; f/u 1 year  . Chest pain     Myoview in 2008 was normal.  Echo 7/09: EF 60%, normal wall motion, mild LVH, mild LAE.  [name] Complication of anesthesia    "stomach does not wake up"  . COPD (chronic obstructive pulmonary disease) ([place])   . Emphysema, unspecified ([place]) 03/05/2020  . GERD (gastroesophageal reflux disease)   . Gout   . History of chicken pox   . History of hemorrhoids   . History of kidney stones   . Hyperlipidemia    under control  . Hypertension    under control  . Hypothyroidism   . Ileus ([place])   . Inguinal hernia   . OSA (obstructive sleep apnea)   . Pancreatitis   . PONV (postoperative nausea and vomiting)   . Rosacea   . 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    Past Surgical History:  Procedure Laterality Date  . ABDOMINAL HERNIA REPAIR  07/15/12   Dr [name]  . ANTERIOR CERVICAL DECOMP/DISCECTOMY FUSION N/A 07/09/2020   Procedure:  ANTERIOR CERVICAL DECOMPRESSION AND FUSION CERVICAL THREE-FOUR.;  Surgeon: [name], MD;  Location: [facility];  Service: Neurosurgery;  Laterality: N/A;  anterior  . BROW LIFT  05/07/01  . CYSTOSCOPY WITH URETEROSCOPY AND STENT PLACEMENT Right 01/28/2014   Procedure: CYSTOSCOPY WITH URETEROSCOPY, BASKET RETRIVAL AND  STENT PLACEMENT;  Surgeon: [name], MD;  [facility];  Service: Urology;  Laterality: Right;  . epidural injections     multiple procedures  . [place]   multiple times  . EYE SURGERY Bilateral 03/25/10, 2012   cataract removal  . HEMORRHOID SURGERY  2014  . HIATAL HERNIA REPAIR  12/21/93  . HOLMIUM LASER APPLICATION Right 2/29/7989   Procedure: HOLMIUM LASER APPLICATION;  Surgeon: [name], MD;  [facility];  Service: Urology;  Laterality: Right;  . INGUINAL HERNIA REPAIR Right 07/15/12   Dr [name], x2  . JOINT REPLACEMENT  07/25/04   right knee  . JOINT REPLACEMENT  07/13/10   left knee  . KNEE SURGERY  1995  . LAPAROSCOPIC CHOLECYSTECTOMY  09/20/06   with intraoperative cholangiogram and right inguinal herniorrhaphy with mesh   . LIGAMENT REPAIR Left 07/2013   shoulder  .  LITHOTRIPSY     x2  . NASAL SEPTUM SURGERY  1975  . REFRACTIVE SURGERY Bilateral   . 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 SKIN CANCER DESTRUCTION     nose, ear  . TONSILLECTOMY  as child  . TOTAL HIP ARTHROPLASTY Left   . URETHRAL DILATION  1991   [name]    There were no vitals filed for this visit.   Subjective Assessment - 08/13/20 0949    Subjective Pt reports he has been doing the exercises, but his RLE continues to feel weak.  He has been doing his exercises on the floor, which has been difficult.    Pertinent History neuropathy, HTN, lumbar DDD, hypothyroidism, carotid stenosis    Patient Stated Goals "I want my leg to get better and walk without limping."    Currently in Pain? No/denies    Pain Score 0-No pain              [facility] PT  Assessment - 08/13/20 0001      Assessment   Medical Diagnosis s/p cervical myelopathy, gait training focus only    Referring Provider (PT) [name], MD    Onset Date/Surgical Date 07/09/20    Hand Dominance Right    Next MD Visit 09/02/20    Prior Therapy known to me from prior PT in 01/2020           [facility] Adult PT Treatment/Exercise - 08/13/20 0001      Ambulation/Gait   Pre-Gait Activities small forward marching trials at counter with LUE support x 10 ft forward/ backward x 5 reps - cues for Rt hip flexion.   With RW - 20 ft forward/ backward with supervision, cue for increasing BOS and for increased hip flexion x 2 reps     Knee/Hip Exercises: Stretches   Other Knee/Hip Stretches butterfly stretch supine x 30 sec       Knee/Hip Exercises: Standing   Forward Step Up Right;1 set;10 reps;Hand Hold: 2;Step Height: 6"   and retro step down; all with slow controlled motion.    Other Standing Knee Exercises Rt foot taps to 6" step with UE support on rails x 10 reps with eccentric lowering of LE to ground.       Knee/Hip Exercises: Seated   Sit to Sand 1 set;10 reps;without UE support   eccentric lowering; arms forward, core engaged.      Knee/Hip Exercises: Supine   Bridges Strengthening;1 set;10 reps    Bridges Limitations no cramping    Straight Leg Raises Strengthening;Right;2 sets;5 reps    Straight Leg Raises Limitations some mild Lt hamstring cramping.       Knee/Hip Exercises: Sidelying   Clams 10 reps R side. cues for core engaged.  limited range.               PT Education - 08/13/20 1233    Education Details HEP    Person(s) Educated Patient    Methods Explanation;Demonstration;Tactile cues;Verbal cues;Handout    Comprehension Returned demonstration;Verbalized understanding               PT Long Term Goals - 08/02/20 2149      PT LONG TERM GOAL #1   Title The patient will be indep with HEP.    Time 6    Period Weeks    Target Date 09/13/20       PT LONG TERM GOAL #2   Title The patient will improve R hip abduction  to 4/5.    Time 6    Period Weeks    Target Date 09/13/20      PT LONG TERM GOAL #3   Title The patient will improve gait speed from 2.02 ft/sec to > or equal to 2.62 ft/sec to demo improved community moiblity.    Time 6    Period Weeks    Target Date 09/13/20      PT LONG TERM GOAL #4   Title The patient will maintain single leg stance x 5 seconds bilat.    Time 6    Period Weeks    Target Date 09/13/20      PT LONG TERM GOAL #5   Title The patient will return demo floor <> stand with one UE support on surface to rise.    Time 6    Period Weeks    Target Date 09/13/20      Additional Long Term Goals   Additional Long Term Goals Yes      PT LONG TERM GOAL #6   Title The patient will demonstrate improved knee control demonstrating recurbatum < 25% of gait x 200 ft.    Time 6    Period Weeks    Target Date 09/13/20                 Plan - 08/13/20 1226    Clinical Impression Statement Pt continues with decreased strength in RLE.  Able to demonstrate improved control of RLE during exercise and gait with intentional focus to movement.  Added exercises to HEP without difficulty.  Goals are ongoing.    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    Rehab Potential Good    PT Frequency 2x / week    PT Duration 6 weeks    PT Treatment/Interventions ADLs/Self Care Home Management;Gait training;Stair training;Functional mobility training;Therapeutic activities;Therapeutic exercise;Balance training;Neuromuscular re-education;Manual techniques;Patient/family education;Taping;Electrical Stimulation;Orthotic Fit/Training     PT Next Visit Plan R knee control activities, strengthening LEs, standing balance, consider AFO for knee control as needed    PT Home Exercise Plan Access Code: [email or web address]    Consulted and Agree with Plan of Care Patient           Patient will benefit from skilled therapeutic intervention in order to improve the following deficits and impairments:  Abnormal gait, Impaired flexibility, Decreased strength, Difficulty walking, Decreased activity tolerance, Decreased balance, Impaired sensation  Visit Diagnosis: Muscle weakness (generalized)  Other abnormalities of gait and mobility  Other symptoms and signs involving the nervous system     Problem List Patient Active Problem List   Diagnosis Date Noted  . Cystoid macular edema of both eyes 07/28/2020  . Status post cervical spinal fusion 07/09/2020  . History of total bilateral knee replacement 07/08/2020  . Aortic atherosclerosis ([place]) 03/05/2020  . Emphysema, unspecified ([place]) 03/05/2020  . SIRS (systemic inflammatory response syndrome) ([place]) 03/04/2020  . Heme positive stool 02/29/2020  . Leg cramps 11/13/2019  . Tibialis anterior tendon tear, nontraumatic 11/13/2019  . 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 CPAP (continuous positive airway pressure) dependence 03/25/2018  . 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 Acute idiopathic gout of hand 03/25/2018  . Erectile dysfunction 06/20/2017  . 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  Peripheral neuropathy 06/19/2016  . Hearing loss 02/02/2016  . 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 Allergic rhinitis 02/02/2016  . Atypical chest pain 11/24/2015  . Preventative health care 11/24/2015  . Onychomycosis 06/30/2015  . Degenerative disc disease, lumbar 06/30/2015  . Other allergic rhinitis 02/18/2015  . Deviated nasal septum 02/18/2015  . OSA on CPAP 02/18/2015  . RLS (restless legs syndrome) 02/18/2015  . GERD  (gastroesophageal reflux disease) 09/18/2014  . Hyperglycemia 03/03/2014  . Sleep apnea with use of continuous positive airway pressure (CPAP) 07/15/2013  . Carotid stenosis 02/24/2013  . Nonspecific abnormal electrocardiogram (ECG) (EKG) 01/06/2013  . DJD (degenerative joint disease) of hip 12/24/2012  . Lower GI bleed 12/12/2012  . Dyspnea 06/12/2011  . Hypothyroidism 05/13/2009  . HYPERCHOLESTEROLEMIA 05/13/2009  . Hyperlipidemia 05/13/2009  . 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  . Essential hypertension 05/13/2009  . INGUINAL HERNIA 05/13/2009  . HIATAL HERNIA 05/13/2009  . NEPHROLITHIASIS 05/13/2009  . ROSACEA 05/13/2009   Kerin Perna, PTA 08/13/20 12:36 PM  [facility] [address], [place], [address] Phone: [phone]   Fax:  [phone]  Name: SAAGAR TORTORELLA MRN: [id number] Date of Birth: 1941-03-06

## 2020-08-16 ENCOUNTER — Telehealth: Payer: Self-pay | Admitting: Family

## 2020-08-16 MED ORDER — LOSARTAN POTASSIUM 100 MG PO TABS: 100.0000 mg | ORAL_TABLET | Freq: Every day | ORAL | 1 refills | Status: DC

## 2020-08-16 MED ORDER — AMLODIPINE BESYLATE 5 MG PO TABS: 5.0000 mg | ORAL_TABLET | Freq: Every day | ORAL | 1 refills | Status: DC

## 2020-08-16 NOTE — Telephone Encounter (Signed)
Refills sent. I would like for the patient to have a nurse visit for bp check and BMET- dx renal insufficiency as well please.

## 2020-08-16 NOTE — Telephone Encounter (Signed)
Medication: losartan (COZAAR) 100 MG tablet amLODipine (NORVASC) 5 MG tablet [[id number]] DISCONTINUED   Has the patient contacted their pharmacy? No. (If no, request that the patient contact the pharmacy for the refill.) (If yes, when and what did the pharmacy advise?)  Preferred Pharmacy (with phone number or street name):  [place] [address] [address] Agent: Please be advised that RX refills may take up to 3 business days. We ask that you follow-up with your pharmacy.

## 2020-08-17 NOTE — Telephone Encounter (Signed)
Sure

## 2020-08-17 NOTE — Telephone Encounter (Signed)
Patient is scheduled for office visit 08-25-20, he wanted to know if he can just get everything done that day.

## 2020-08-17 NOTE — Telephone Encounter (Signed)
Lvm for patient to be aware

## 2020-08-20 ENCOUNTER — Encounter: Payer: Self-pay | Admitting: Physical Therapy

## 2020-08-20 ENCOUNTER — Ambulatory Visit: Payer: Medicare HMO | Admitting: Physical Therapy

## 2020-08-20 ENCOUNTER — Other Ambulatory Visit: Payer: Self-pay

## 2020-08-20 DIAGNOSIS — R2681 Unsteadiness on feet: Secondary | ICD-10-CM

## 2020-08-20 DIAGNOSIS — R29818 Other symptoms and signs involving the nervous system: Secondary | ICD-10-CM

## 2020-08-20 DIAGNOSIS — R2689 Other abnormalities of gait and mobility: Secondary | ICD-10-CM | POA: Diagnosis not present

## 2020-08-20 DIAGNOSIS — M6281 Muscle weakness (generalized): Secondary | ICD-10-CM | POA: Diagnosis not present

## 2020-08-20 NOTE — Therapy (Signed)
[facility] [place], [place], [address] Phone: [phone]   Fax:  [phone]  Physical Therapy Treatment  Patient Details  Name: [name] Pratt MRN: [id number] Date of Birth: 1940-12-15 Referring Provider (PT): [name], MD   Encounter Date: 08/20/2020   PT End of Session - 08/20/20 1024    Visit Number 3    Number of Visits 12    Date for PT Re-Evaluation 09/13/20    Authorization Type [name]    PT Start Time 1017    PT Stop Time 1102    PT Time Calculation (min) 45 min    Equipment Utilized During Treatment Gait belt    Activity Tolerance Patient tolerated treatment well    Behavior During Therapy [facility] for tasks assessed/performed           Past Medical History:  Diagnosis Date  . Aortic atherosclerosis ([place]) 03/05/2020  . Arthritis   . Cancer ([place])    skin cancer  . Carotid stenosis    a. Carotid U/S 6/60: LICA < 63%, RICA 01-60%;  b.  Carotid U/S 1/09:  RICA 32-35%; LICA 5-73%; f/u 1 year  . Chest pain     Myoview in 2008 was normal.  Echo 7/09: EF 60%, normal wall motion, mild LVH, mild LAE.  [name] Complication of anesthesia    "stomach does not wake up"  . COPD (chronic obstructive pulmonary disease) ([place])   . Emphysema, unspecified ([place]) 03/05/2020  . GERD (gastroesophageal reflux disease)   . Gout   . History of chicken pox   . History of hemorrhoids   . History of kidney stones   . Hyperlipidemia    under control  . Hypertension    under control  . Hypothyroidism   . Ileus ([place])   . Inguinal hernia   . OSA (obstructive sleep apnea)   . Pancreatitis   . PONV (postoperative nausea and vomiting)   . Rosacea   . 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    Past Surgical History:  Procedure Laterality Date  . ABDOMINAL HERNIA REPAIR  07/15/12   Dr [name]  . ANTERIOR CERVICAL DECOMP/DISCECTOMY FUSION N/A 07/09/2020    Procedure: ANTERIOR CERVICAL DECOMPRESSION AND FUSION CERVICAL THREE-FOUR.;  Surgeon: [name], MD;  Location: [facility];  Service: Neurosurgery;  Laterality: N/A;  anterior  . BROW LIFT  05/07/01  . CYSTOSCOPY WITH URETEROSCOPY AND STENT PLACEMENT Right 01/28/2014   Procedure: CYSTOSCOPY WITH URETEROSCOPY, BASKET RETRIVAL AND  STENT PLACEMENT;  Surgeon: [name], MD;  [facility];  Service: Urology;  Laterality: Right;  . epidural injections     multiple procedures  . [place]   multiple times  . EYE SURGERY Bilateral 03/25/10, 2012   cataract removal  . HEMORRHOID SURGERY  2014  . HIATAL HERNIA REPAIR  12/21/93  . HOLMIUM LASER APPLICATION Right 10/31/2540   Procedure: HOLMIUM LASER APPLICATION;  Surgeon: [name], MD;  [facility];  Service: Urology;  Laterality: Right;  . INGUINAL HERNIA REPAIR Right 07/15/12   Dr [name], x2  . JOINT REPLACEMENT  07/25/04   right knee  . JOINT REPLACEMENT  07/13/10   left knee  . KNEE SURGERY  1995  . LAPAROSCOPIC CHOLECYSTECTOMY  09/20/06   with intraoperative cholangiogram and right inguinal herniorrhaphy with mesh   . LIGAMENT REPAIR Left 07/2013   shoulder  .  LITHOTRIPSY     x2  . NASAL SEPTUM SURGERY  1975  . REFRACTIVE SURGERY Bilateral   . 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 SKIN CANCER DESTRUCTION     nose, ear  . TONSILLECTOMY  as child  . TOTAL HIP ARTHROPLASTY Left   . URETHRAL DILATION  1991   [name]    There were no vitals filed for this visit.   Subjective Assessment - 08/20/20 1644    Subjective Pt reports no new changes since last visit.  He continues to complete exercises on floor 2x/day and reports it is difficult to get on/off floor; doesn't want to do exercises in bed because it is too soft and his feet slip.    Pertinent History neuropathy, HTN, lumbar DDD, hypothyroidism, carotid stenosis    Patient Stated Goals "I want my leg to get better and walk without limping."     Currently in Pain? No/denies    Pain Score 0-No pain              [facility] PT Assessment - 08/20/20 0001      Assessment   Medical Diagnosis s/p cervical myelopathy, gait training focus only    Referring Provider (PT) [name], MD    Onset Date/Surgical Date 07/09/20    Hand Dominance Right    Next MD Visit 09/02/20    Prior Therapy known to me from prior PT in 01/2020            [facility] Adult PT Treatment/Exercise - 08/20/20 0001      Knee/Hip Exercises: Stretches   Passive Hamstring Stretch Right;Left;2 reps;30 seconds   seated with straight back   Piriformis Stretch Right;Left;1 rep;30 seconds   supine fig 4   Other Knee/Hip Stretches butterfly stretch supine x 30 sec       Knee/Hip Exercises: Aerobic   Nustep L4: 4.5 min      Knee/Hip Exercises: Standing   Heel Raises Both;1 set;10 reps   and toe raises with light UE support.   Forward Step Up Right;1 set;10 reps;Hand Hold: 2;Step Height: 4"   and retro step down; all with slow controlled motion.    Stairs 2 step-down on 6" step with LLE, BUE on rails    Other Standing Knee Exercises Rt foot taps to 6" step with UE support on rails x 10 reps with eccentric lowering of LE to ground. Lt foot taps to 4" step with min A x 5 reps    Other Standing Knee Exercises weight shifts Rt/Lt to SLS without lifting foot off of ground.      Knee/Hip Exercises: Seated   Sit to Sand 1 set;10 reps;without UE support   eccentric lowering; arms forward, core engaged.      Knee/Hip Exercises: Supine   Bridges Both;1 set;5 reps    Straight Leg Raises Strengthening;Right;2 sets;5 reps    Straight Leg Raises Limitations fatigues quickly, and cramping in Lt hamstring           Balance Exercises - 08/20/20 0001      Balance Exercises: Standing   Tandem Stance Eyes open;Intermittent upper extremity support;2 reps;20 secs   knee guarding with Rt leg back                 PT Long Term Goals - 08/02/20 2149      PT LONG TERM GOAL #1    Title The patient will be indep with HEP.    Time 6  Period Weeks    Target Date 09/13/20      PT LONG TERM GOAL #2   Title The patient will improve R hip abduction to 4/5.    Time 6    Period Weeks    Target Date 09/13/20      PT LONG TERM GOAL #3   Title The patient will improve gait speed from 2.02 ft/sec to > or equal to 2.62 ft/sec to demo improved community moiblity.    Time 6    Period Weeks    Target Date 09/13/20      PT LONG TERM GOAL #4   Title The patient will maintain single leg stance x 5 seconds bilat.    Time 6    Period Weeks    Target Date 09/13/20      PT LONG TERM GOAL #5   Title The patient will return demo floor <> stand with one UE support on surface to rise.    Time 6    Period Weeks    Target Date 09/13/20      Additional Long Term Goals   Additional Long Term Goals Yes      PT LONG TERM GOAL #6   Title The patient will demonstrate improved knee control demonstrating recurbatum < 25% of gait x 200 ft.    Time 6    Period Weeks    Target Date 09/13/20                 Plan - 08/20/20 1030    Clinical Impression Statement NuStep fatigued LEs today; pt unsteady with ambulation to mat table afterwards. Extensor lag in RLE with SLR and cramping in bilat hamstrings with mat exercises.  Pt requires minA for knee guarding, and tactile cues to Rt knee when attempting Rt SLS for exercises due to recurvatum and buckling.  Close SBA to CGA during standing exercises due to unsteadiness. With cues for increased Rt hip flexion during gait in between exercises, gait quality improves.  Goals are ongoing.    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    Rehab  Potential Good    PT Frequency 2x / week    PT Duration 6 weeks    PT Treatment/Interventions ADLs/Self Care Home Management;Gait training;Stair training;Functional mobility training;Therapeutic activities;Therapeutic exercise;Balance training;Neuromuscular re-education;Manual techniques;Patient/family education;Taping;Electrical Stimulation;Orthotic Fit/Training    PT Next Visit Plan R knee control activities, strengthening LEs, standing balance, consider AFO for knee control as needed    PT Home Exercise Plan Access Code: [email or web address]    Consulted and Agree with Plan of Care Patient           Patient will benefit from skilled therapeutic intervention in order to improve the following deficits and impairments:  Abnormal gait,Impaired flexibility,Decreased strength,Difficulty walking,Decreased activity tolerance,Decreased balance,Impaired sensation  Visit Diagnosis: Muscle weakness (generalized)  Other abnormalities of gait and mobility  Other symptoms and signs involving the nervous system  Unsteadiness on feet     Problem List Patient Active Problem List   Diagnosis Date Noted  . Cystoid macular edema of both eyes 07/28/2020  . Status post cervical spinal fusion 07/09/2020  . History of total bilateral knee replacement 07/08/2020  . Aortic atherosclerosis ([place]) 03/05/2020  . Emphysema, unspecified ([place]) 03/05/2020  . SIRS (systemic inflammatory response syndrome) ([place]) 03/04/2020  .  Heme positive stool 02/29/2020  . Leg cramps 11/13/2019  . Tibialis anterior tendon tear, nontraumatic 11/13/2019  . 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 CPAP (continuous positive airway pressure) dependence 03/25/2018  . 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 Acute idiopathic gout of hand 03/25/2018  . Erectile dysfunction 06/20/2017  . 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 Peripheral neuropathy 06/19/2016  . Hearing loss 02/02/2016   . 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 Allergic rhinitis 02/02/2016  . Atypical chest pain 11/24/2015  . Preventative health care 11/24/2015  . Onychomycosis 06/30/2015  . Degenerative disc disease, lumbar 06/30/2015  . Other allergic rhinitis 02/18/2015  . Deviated nasal septum 02/18/2015  . OSA on CPAP 02/18/2015  . RLS (restless legs syndrome) 02/18/2015  . GERD (gastroesophageal reflux disease) 09/18/2014  . Hyperglycemia 03/03/2014  . Sleep apnea with use of continuous positive airway pressure (CPAP) 07/15/2013  . Carotid stenosis 02/24/2013  . Nonspecific abnormal electrocardiogram (ECG) (EKG) 01/06/2013  . DJD (degenerative joint disease) of hip 12/24/2012  . Lower GI bleed 12/12/2012  . Dyspnea 06/12/2011  . Hypothyroidism 05/13/2009  . HYPERCHOLESTEROLEMIA 05/13/2009  . Hyperlipidemia 05/13/2009  . 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  . Essential hypertension 05/13/2009  . INGUINAL HERNIA 05/13/2009  . HIATAL HERNIA 05/13/2009  . NEPHROLITHIASIS 05/13/2009  . ROSACEA 05/13/2009   Kerin Perna, PTA 08/20/20 4:46 PM  [facility] [place], [place], [address] Phone: [phone]   Fax:  [phone]  Name: [name] Pratt MRN: [id number] Date of Birth: 1941-03-31

## 2020-08-23 ENCOUNTER — Other Ambulatory Visit: Payer: Self-pay | Admitting: *Deleted

## 2020-08-23 MED ORDER — LOSARTAN POTASSIUM 100 MG PO TABS: 100.0000 mg | ORAL_TABLET | Freq: Every day | ORAL | 1 refills | Status: DC

## 2020-08-23 MED ORDER — AMLODIPINE BESYLATE 5 MG PO TABS: 5.0000 mg | ORAL_TABLET | Freq: Every day | ORAL | 1 refills | Status: DC

## 2020-08-24 ENCOUNTER — Ambulatory Visit: Payer: Medicare HMO | Admitting: Physical Therapy

## 2020-08-24 ENCOUNTER — Other Ambulatory Visit: Payer: Self-pay

## 2020-08-24 DIAGNOSIS — R2689 Other abnormalities of gait and mobility: Secondary | ICD-10-CM | POA: Diagnosis not present

## 2020-08-24 DIAGNOSIS — M6281 Muscle weakness (generalized): Secondary | ICD-10-CM | POA: Diagnosis not present

## 2020-08-24 DIAGNOSIS — R2681 Unsteadiness on feet: Secondary | ICD-10-CM | POA: Diagnosis not present

## 2020-08-24 DIAGNOSIS — R29818 Other symptoms and signs involving the nervous system: Secondary | ICD-10-CM | POA: Diagnosis not present

## 2020-08-24 NOTE — Therapy (Signed)
[facility] [place], [place], [address] Phone: [phone]   Fax:  [phone]  Physical Therapy Treatment  Patient Details  Name: [name] Pratt MRN: [id number] Date of Birth: [birth year] Referring Provider (PT): [name], MD   Encounter Date: 08/24/2020   PT End of Session - 08/24/20 1239    Visit Number 4    Number of Visits 12    Date for PT Re-Evaluation 09/13/20    Authorization Type [name]    PT Start Time 2951    PT Stop Time 1220    PT Time Calculation (min) 45 min    Equipment Utilized During Treatment Gait belt    Activity Tolerance Patient tolerated treatment well    Behavior During Therapy [facility] for tasks assessed/performed           Past Medical History:  Diagnosis Date  . Aortic atherosclerosis ([place]) 03/05/2020  . Arthritis   . Cancer ([place])    skin cancer  . Carotid stenosis    a. Carotid U/S 8/84: LICA < 16%, RICA 60-63%;  b.  Carotid U/S 0/16:  RICA 01-09%; LICA 3-23%; f/u 1 year  . Chest pain     Myoview in 2008 was normal.  Echo 7/09: EF 60%, normal wall motion, mild LVH, mild LAE.  [name] Complication of anesthesia    "stomach does not wake up"  . COPD (chronic obstructive pulmonary disease) ([place])   . Emphysema, unspecified ([place]) 03/05/2020  . GERD (gastroesophageal reflux disease)   . Gout   . History of chicken pox   . History of hemorrhoids   . History of kidney stones   . Hyperlipidemia    under control  . Hypertension    under control  . Hypothyroidism   . Ileus ([place])   . Inguinal hernia   . OSA (obstructive sleep apnea)   . Pancreatitis   . PONV (postoperative nausea and vomiting)   . Rosacea   . 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    Past Surgical History:  Procedure Laterality Date  . ABDOMINAL HERNIA REPAIR  07/15/12   Dr [name]  . ANTERIOR CERVICAL DECOMP/DISCECTOMY FUSION N/A 07/09/2020    Procedure: ANTERIOR CERVICAL DECOMPRESSION AND FUSION CERVICAL THREE-FOUR.;  Surgeon: [name], MD;  Location: [place];  Service: Neurosurgery;  Laterality: N/A;  anterior  . BROW LIFT  05/07/01  . CYSTOSCOPY WITH URETEROSCOPY AND STENT PLACEMENT Right 01/28/2014   Procedure: CYSTOSCOPY WITH URETEROSCOPY, BASKET RETRIVAL AND  STENT PLACEMENT;  Surgeon: [name], MD;  [facility];  Service: Urology;  Laterality: Right;  . epidural injections     multiple procedures  . [place]   multiple times  . EYE SURGERY Bilateral 03/25/10, 2012   cataract removal  . HEMORRHOID SURGERY  2014  . HIATAL HERNIA REPAIR  12/21/93  . HOLMIUM LASER APPLICATION Right 5/57/3220   Procedure: HOLMIUM LASER APPLICATION;  Surgeon: [name], MD;  [facility];  Service: Urology;  Laterality: Right;  . INGUINAL HERNIA REPAIR Right 07/15/12   Dr [name], x2  . JOINT REPLACEMENT  07/25/04   right knee  . JOINT REPLACEMENT  07/13/10   left knee  . KNEE SURGERY  1995  . LAPAROSCOPIC CHOLECYSTECTOMY  09/20/06   with intraoperative cholangiogram and right inguinal herniorrhaphy with mesh   . LIGAMENT REPAIR Left 07/2013   shoulder  .  LITHOTRIPSY     x2  . NASAL SEPTUM SURGERY  1975  . REFRACTIVE SURGERY Bilateral   . 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 SKIN CANCER DESTRUCTION     nose, ear  . TONSILLECTOMY  as child  . TOTAL HIP ARTHROPLASTY Left   . URETHRAL DILATION  1991   [name]    There were no vitals filed for this visit.   Subjective Assessment - 08/24/20 1148    Subjective "I have good days and bad days.  Today is a so-so day".   Pt reports he is doing exercises 2x/day, but is unsure if he is getting stronger.  He complains of continued weakness in RLE with standing and walking; varies depending on activity in day. He is concerned of how far he will be able to walk for his family member graduation this weekend.    Pertinent History neuropathy, HTN, lumbar  DDD, hypothyroidism, carotid stenosis    Patient Stated Goals "I want my leg to get better and walk without limping."    Currently in Pain? No/denies    Pain Score 0-No pain              [facility] PT Assessment - 08/24/20 0001      Assessment   Medical Diagnosis s/p cervical myelopathy, gait training focus only    Referring Provider (PT) [name], MD    Onset Date/Surgical Date 07/09/20    Hand Dominance Right    Next MD Visit 09/02/20    Prior Therapy known to me from prior PT in 01/2020      Strength   Right Hip ABduction 3/5      Ambulation/Gait   Gait velocity 1.97 ft/sec            [facility] Adult PT Treatment/Exercise - 08/24/20 0001      Ambulation/Gait   Ambulation/Gait Assistance 5: Supervision;4: Min guard    Ambulation Distance (Feet) 80 Feet   2 trials   Assistive device None    Gait Pattern Step-through pattern;Right circumduction;Antalgic;Trendelenburg;Narrow base of support;Decreased hip/knee flexion - right    Gait Comments cues for increased Rt hip flexion during swing through and weight shift Rt.      Knee/Hip Exercises: Stretches   Passive Hamstring Stretch Right;Left;2 reps;20 seconds   seated, straight back   Piriformis Stretch Right;Left;20 seconds;2 reps   supine fig 4     Knee/Hip Exercises: Standing   Lateral Step Up Right;1 set;10 reps;Hand Hold: 2;Step Height: 6"    Forward Step Up --    Wall Squat 1 set;10 reps   quarter squat   SLS LLE x 3 sec x 3 reps without UE support; RLE - x 5 sec with UE support and min guard for RLEto prevent buckling.      Knee/Hip Exercises: Seated   Marching Right;1 set;10 reps   core engaged.   Sit to Sand 1 set;without UE support;5 reps   eccentric lowering; arms forward, core engaged.      Knee/Hip Exercises: Supine   Bridges 1 set;10 reps   3 sec hold   Bridges Limitations no cramping      Knee/Hip Exercises: Sidelying   Hip ABduction Right;2 sets;5 reps    Clams 10 reps R side. cues for core engaged.   limited range.            Balance Exercises - 08/24/20 0001      Balance Exercises: Standing   Tandem Stance Eyes open;Intermittent upper  extremity support;2 reps;20 secs   knee guarding with Rt leg back                 PT Long Term Goals - 08/24/20 1234      PT LONG TERM GOAL #1   Title The patient will be indep with HEP.    Time 6    Period Weeks    Status On-going      PT LONG TERM GOAL #2   Title The patient will improve R hip abduction to 4/5.    Time 6    Period Weeks    Status On-going      PT LONG TERM GOAL #3   Title The patient will improve gait speed from 2.02 ft/sec to > or equal to 2.62 ft/sec to demo improved community moiblity.    Time 6    Period Weeks    Status On-going      PT LONG TERM GOAL #4   Title The patient will maintain single leg stance x 5 seconds bilat.    Time 6    Period Weeks    Status On-going      PT LONG TERM GOAL #5   Title The patient will return demo floor <> stand with one UE support on surface to rise.    Time 6    Period Weeks    Status On-going      PT LONG TERM GOAL #6   Title The patient will demonstrate improved knee control demonstrating recurbatum < 25% of gait x 200 ft.    Time 6    Period Weeks    Status On-going                 Plan - 08/24/20 1230    Clinical Impression Statement Pt's gait speed similar to last assessment, slightly slower.  Continued weakness in RLE, with sidelying exercise and standing exercise. Pt demonstrated good control of RLE with sidestepping up 6" stairs and side stepping at counter.  He demonstrates improved Rt hip flexion during swing phase of gait with cues.  Pt making gradual progress towards goals.    Personal Factors and Comorbidities Comorbidity 3+    Comorbidities neuropathy, HTN, lumbar DDD, cervical myelopathy    Examination-Activity Limitations Locomotion Level;Squat;Stairs;Stand    Examination-Participation Restrictions Cleaning;Community Activity;Yard Work     Merchant navy officer Evolving/Moderate complexity    Rehab Potential Good    PT Frequency 2x / week    PT Duration 6 weeks    PT Treatment/Interventions ADLs/Self Care Home Management;Gait training;Stair training;Functional mobility training;Therapeutic activities;Therapeutic exercise;Balance training;Neuromuscular re-education;Manual techniques;Patient/family education;Taping;Electrical Stimulation;Orthotic Fit/Training    PT Next Visit Plan R knee control activities, strengthening LEs, standing balance.    PT Home Exercise Plan Access Code: [email or web address]    Consulted and Agree with Plan of Care Patient           Patient will benefit from skilled therapeutic intervention in order to improve the following deficits and impairments:  Abnormal gait,Impaired flexibility,Decreased strength,Difficulty walking,Decreased activity tolerance,Decreased balance,Impaired sensation  Visit Diagnosis: Muscle weakness (generalized)  Other abnormalities of gait and mobility  Other symptoms and signs involving the nervous system  Unsteadiness on feet     Problem List Patient Active Problem List   Diagnosis Date Noted  . Cystoid macular edema of both eyes 07/28/2020  . Status post cervical spinal fusion 07/09/2020  . History of total bilateral knee replacement 07/08/2020  . Aortic atherosclerosis ([place]) 03/05/2020  . Emphysema, unspecified (  [place]) 03/05/2020  . SIRS (systemic inflammatory response syndrome) ([place]) 03/04/2020  . Heme positive stool 02/29/2020  . Leg cramps 11/13/2019  . Tibialis anterior tendon tear, nontraumatic 11/13/2019  . 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 CPAP (continuous positive airway pressure) dependence 03/25/2018  . 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 Acute idiopathic gout of hand 03/25/2018  . Erectile dysfunction 06/20/2017  . 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 Peripheral neuropathy  06/19/2016  . Hearing loss 02/02/2016  . 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 Allergic rhinitis 02/02/2016  . Atypical chest pain 11/24/2015  . Preventative health care 11/24/2015  . Onychomycosis 06/30/2015  . Degenerative disc disease, lumbar 06/30/2015  . Other allergic rhinitis 02/18/2015  . Deviated nasal septum 02/18/2015  . OSA on CPAP 02/18/2015  . RLS (restless legs syndrome) 02/18/2015  . GERD (gastroesophageal reflux disease) 09/18/2014  . Hyperglycemia 03/03/2014  . Sleep apnea with use of continuous positive airway pressure (CPAP) 07/15/2013  . Carotid stenosis 02/24/2013  . Nonspecific abnormal electrocardiogram (ECG) (EKG) 01/06/2013  . DJD (degenerative joint disease) of hip 12/24/2012  . Lower GI bleed 12/12/2012  . Dyspnea 06/12/2011  . Hypothyroidism 05/13/2009  . HYPERCHOLESTEROLEMIA 05/13/2009  . Hyperlipidemia 05/13/2009  . 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  . Essential hypertension 05/13/2009  . INGUINAL HERNIA 05/13/2009  . HIATAL HERNIA 05/13/2009  . NEPHROLITHIASIS 05/13/2009  . ROSACEA 05/13/2009    Kerin Perna, PTA 08/24/20 12:41 PM  [place], [place], [address] Phone: [phone]   Fax:  [phone]  Name: BEATRIZ SETTLES MRN: [id number] Date of Birth: 02-07-1941

## 2020-08-25 ENCOUNTER — Telehealth: Payer: Self-pay | Admitting: Family

## 2020-08-25 ENCOUNTER — Encounter (HOSPITAL_COMMUNITY): Payer: Self-pay | Admitting: Neurosurgery

## 2020-08-25 ENCOUNTER — Ambulatory Visit (INDEPENDENT_AMBULATORY_CARE_PROVIDER_SITE_OTHER): Payer: Medicare HMO | Admitting: Family

## 2020-08-25 VITALS — BP 147/71 | HR 62 | Temp 97.7°F | Resp 16 | Ht 66.0 in | Wt 151.0 lb

## 2020-08-25 DIAGNOSIS — R634 Abnormal weight loss: Secondary | ICD-10-CM | POA: Diagnosis not present

## 2020-08-25 DIAGNOSIS — N289 Disorder of kidney and ureter, unspecified: Secondary | ICD-10-CM

## 2020-08-25 LAB — BASIC METABOLIC PANEL
BUN: 29 mg/dL — ABNORMAL HIGH (ref 6–23)
CO2: 29 mEq/L (ref 19–32)
Calcium: 9.3 mg/dL (ref 8.4–10.5)
Chloride: 106 mEq/L (ref 96–112)
Creatinine, Ser: 1.24 mg/dL (ref 0.40–1.50)
GFR: 54.91 mL/min — ABNORMAL LOW (ref >60.00)
Glucose, Bld: 99 mg/dL (ref 70–99)
Potassium: 3.8 mEq/L (ref 3.5–5.1)
Sodium: 142 mEq/L (ref 135–145)

## 2020-08-25 NOTE — Progress Notes (Signed)
Subjective:    Patient ID: [name] Pratt, male    DOB: 08/11/1941, 79 y.o.   MRN: [id number]  HPI   Patient is an 79 yr old male who presents today to discuss weight loss. His weight was as high as 177 back in 2020. He states that he has not been trying to lose weight, but his wife is very concerned about his weight loss. He reports good appetite.    Lab Results  Component Value Date   TSH 1.01 06/15/2020   Wt Readings from Last 3 Encounters:  08/25/20 151 lb (68.5 kg)  07/15/20 145 lb (65.8 kg)  07/09/20 151 lb 14.4 oz (68.9 kg)    Since his last visit, he had an anterior cervical decompression/fusion C3-4 surgery.  He notes improvement in his RUE coordination and weakness.  He is still having RLE weakness but is doing PT and continues to follow with neurosurgery for possible lumbar surgery in a few months.     Review of Systems See HPI  Past Medical History:  Diagnosis Date  . Aortic atherosclerosis ([place]) 03/05/2020  . Arthritis   . Cancer ([place])    skin cancer  . Carotid stenosis    a. Carotid U/S 9/44: LICA < 96%, RICA 75-91%;  b.  Carotid U/S 6/38:  RICA 46-65%; LICA 9-93%; f/u 1 year  . Chest pain     Myoview in 2008 was normal.  Echo 7/09: EF 60%, normal wall motion, mild LVH, mild LAE.  [name] Complication of anesthesia    "stomach does not wake up"  . COPD (chronic obstructive pulmonary disease) ([place])   . Emphysema, unspecified ([place]) 03/05/2020  . GERD (gastroesophageal reflux disease)   . Gout   . History of chicken pox   . History of hemorrhoids   . History of kidney stones   . Hyperlipidemia    under control  . Hypertension    under control  . Hypothyroidism   . Ileus ([place])   . Inguinal hernia   . OSA (obstructive sleep apnea)   . Pancreatitis   . PONV (postoperative nausea and vomiting)   . Rosacea   . 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     Social History   Socioeconomic History  . Marital  status: Married    Spouse name: Not on file  . Number of children: 1  . Years of education: Not on file  . Highest education level: Not on file  Occupational History  . 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 Smoking status: Former Smoker    Years: 11.00    Types: Cigarettes    Quit date: 09/11/1978    Years since quitting: 41.9  . Smokeless tobacco: Never Used  Vaping Use  . Vaping Use: Never used  Substance and Sexual Activity  . Alcohol use: Yes    Alcohol/week: 0.0 standard drinks    Comment: rare  . Drug use: No  . Sexual activity: Not on file  Other Topics Concern  . Not on file  Social History Narrative   Lives with his wife   He has one son- lives locally.   2 grandchildren   Has worked for Research officer, trade union   Enjoys wood working   Completed HS.  [employer]   Social Determinants of Radio broadcast assistant Strain: Not on file  Food Insecurity: Not on file  Transportation  Needs: Not on file  Physical Activity: Not on file  Stress: Not on file  Social Connections: Not on file  Intimate Partner Violence: Not on file    Past Surgical History:  Procedure Laterality Date  . ABDOMINAL HERNIA REPAIR  07/15/12   Dr [name]  . ANTERIOR CERVICAL DECOMP/DISCECTOMY FUSION N/A 07/09/2020   Procedure: ANTERIOR CERVICAL DECOMPRESSION AND FUSION CERVICAL THREE-FOUR.;  Surgeon: [name], MD;  Location: [facility];  Service: Neurosurgery;  Laterality: N/A;  anterior  . BROW LIFT  05/07/01  . CYSTOSCOPY WITH URETEROSCOPY AND STENT PLACEMENT Right 01/28/2014   Procedure: CYSTOSCOPY WITH URETEROSCOPY, BASKET RETRIVAL AND  STENT PLACEMENT;  Surgeon: [name], MD;  [facility];  Service: Urology;  Laterality: Right;  . epidural injections     multiple procedures  . [place]   multiple times  . EYE SURGERY Bilateral 03/25/10, 2012   cataract removal  . HEMORRHOID SURGERY  2014  . HIATAL HERNIA REPAIR  12/21/93  .  HOLMIUM LASER APPLICATION Right 09/12/5850   Procedure: HOLMIUM LASER APPLICATION;  Surgeon: [name], MD;  [facility];  Service: Urology;  Laterality: Right;  . INGUINAL HERNIA REPAIR Right 07/15/12   Dr [name], x2  . JOINT REPLACEMENT  07/25/04   right knee  . JOINT REPLACEMENT  07/13/10   left knee  . KNEE SURGERY  1995  . LAPAROSCOPIC CHOLECYSTECTOMY  09/20/06   with intraoperative cholangiogram and right inguinal herniorrhaphy with mesh   . LIGAMENT REPAIR Left 07/2013   shoulder  . LITHOTRIPSY     x2  . NASAL SEPTUM SURGERY  1975  . REFRACTIVE SURGERY Bilateral   . 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 SKIN CANCER DESTRUCTION     nose, ear  . TONSILLECTOMY  as child  . TOTAL HIP ARTHROPLASTY Left   . URETHRAL DILATION  1991   [name]    Family History  Problem Relation Age of Onset  . Cancer Mother   . Hypertension Other   . Cancer Other   . Osteoarthritis Other     Allergies  Allergen Reactions  . Elastic Bandages & [Zinc] Other (See Comments)    Teflon bandages - Irritation  . Pravastatin     Muscle cramps  . Sildenafil     Tachycardia    . Metoclopramide Hcl Anxiety    Current Outpatient Medications on File Prior to Visit  Medication Sig Dispense Refill  . acetaminophen (TYLENOL) 650 MG CR tablet Take 1,300 mg by mouth every 8 (eight) hours as needed for pain.    [name] albuterol (VENTOLIN HFA) 108 (90 Base) MCG/ACT inhaler Inhale 2 puffs into the lungs every 6 (six) hours as needed for shortness of breath. 1 each 3  . allopurinol (ZYLOPRIM) 100 MG tablet TAKE 1 TABLET TWICE DAILY (Patient taking differently: Take 100 mg by mouth 2 (two) times daily.) 180 tablet 1  . amLODipine (NORVASC) 5 MG tablet Take 1 tablet (5 mg total) by mouth daily. 90 tablet 1  . Artificial Tear Solution (SOOTHE XP OP) Place 1 drop into both eyes daily.    [name] aspirin EC 81 MG tablet Take 81 mg by mouth every morning.    . cholecalciferol (VITAMIN D3) 25 MCG (1000 UNIT)  tablet Take 1,000 Units by mouth daily.    . Cranberry 500 MG TABS Take 500 mg by mouth daily.     . cycloSPORINE (RESTASIS) 0.05 % ophthalmic emulsion Place  1 drop into both eyes 2 (two) times daily.    . diclofenac sodium (VOLTAREN) 1 % GEL Apply 1 application topically daily as needed (arthritis). 100 g 2  . docusate sodium (COLACE) 100 MG capsule Take 1 capsule (100 mg total) by mouth 2 (two) times daily as needed for mild constipation. (Patient taking differently: Take 100 mg by mouth daily.) 10 capsule 0  . ferrous sulfate 325 (65 FE) MG tablet Take 325 mg by mouth daily with breakfast.    . gabapentin (NEURONTIN) 300 MG capsule TAKE 1 CAPSULE TWO TO THREE TIMES DAILY AS NEEDED (Patient taking differently: Take 300 mg by mouth 2 (two) times daily as needed (pain).) 60 capsule 1  . Homeopathic Products (THERAWORX RELIEF) FOAM Apply 1 application topically at bedtime.    [name] levothyroxine (SYNTHROID) 150 MCG tablet TAKE 1 TABLET ONE TIME DAILY ON 6 DAYS PER WEEK AND 1/2 TABLET ON 1 DAY PER WEEK (Patient taking differently: Take 75-150 mcg by mouth See admin instructions. Take 75 mcg on Sundays, take 150 mcg daily Mon - Sat) 78 tablet 1  . losartan (COZAAR) 100 MG tablet Take 1 tablet (100 mg total) by mouth daily. 90 tablet 1  . Menthol, Topical Analgesic, (ZIMS MAX-FREEZE EX) Apply 1 application topically daily as needed (pain).    . multivitamin (THERAGRAN) per tablet Take 1 tablet by mouth daily.    [name] MYRBETRIQ 25 MG TB24 tablet Take 1 tablet (25 mg total) by mouth daily. 30 tablet 2  . nitroGLYCERIN (NITROSTAT) 0.4 MG SL tablet Place 1 tablet (0.4 mg total) under the tongue every 5 (five) minutes as needed for chest pain. 25 tablet 3  . Omega-3 Fatty Acids (FISH OIL) 1200 MG CAPS Take 1,200 mg by mouth daily.    . rosuvastatin (CRESTOR) 5 MG tablet Take 1 tablet by mouth once daily (Patient taking differently: Take 5 mg by mouth daily.) 90 tablet 0  . sodium chloride (OCEAN) 0.65 % nasal spray  Place 1 spray into both nostrils at bedtime as needed for congestion.     No current facility-administered medications on file prior to visit.    BP (!) 147/71 (BP Location: Right Arm, Patient Position: Sitting, Cuff Size: Small)   Pulse 62   Temp 97.7 F (36.5 C) (Oral)   Resp 16   Ht 5\' 6"  (1.676 m)   Wt 151 lb (68.5 kg)   SpO2 100%   BMI 24.37 kg/m       Objective:   Physical Exam Constitutional:      General: He is not in acute distress.    Appearance: He is well-developed and well-nourished.  HENT:     Head: Normocephalic and atraumatic.  Cardiovascular:     Rate and Rhythm: Normal rate and regular rhythm.     Heart sounds: No murmur heard.   Pulmonary:     Effort: Pulmonary effort is normal. No respiratory distress.     Breath sounds: Normal breath sounds. No wheezing or rales.  Musculoskeletal:        General: No edema.  Skin:    General: Skin is warm and dry.  Neurological:     Mental Status: He is alert and oriented to person, place, and time.  Psychiatric:        Mood and Affect: Mood and affect normal.        Behavior: Behavior normal.        Thought Content: Thought content normal.  Assessment & Plan:  Unintentional weight loss- (27 pounds).  TSH normal, has had recent stable cmet, cbc.  Will order CT chest, abdomen and pelvis for further evaluation. Follow up in 1 month.   Renal insufficiency- obtain follow up bmet due to upcoming CT with contrast.  This visit occurred during the SARS-CoV-2 public health emergency.  Safety protocols were in place, including screening questions prior to the visit, additional usage of staff PPE, and extensive cleaning of exam room while observing appropriate contact time as indicated for disinfecting solutions.

## 2020-08-25 NOTE — Telephone Encounter (Signed)
See order(s).

## 2020-08-25 NOTE — Patient Instructions (Addendum)
Please complete lab work prior to leaving. You should be contacted about scheduling your CT scans. Follow up as scheduled in January.

## 2020-08-27 ENCOUNTER — Ambulatory Visit (INDEPENDENT_AMBULATORY_CARE_PROVIDER_SITE_OTHER): Payer: Medicare HMO | Admitting: Physical Therapy

## 2020-08-27 ENCOUNTER — Other Ambulatory Visit: Payer: Self-pay

## 2020-08-27 DIAGNOSIS — R29818 Other symptoms and signs involving the nervous system: Secondary | ICD-10-CM

## 2020-08-27 DIAGNOSIS — R2689 Other abnormalities of gait and mobility: Secondary | ICD-10-CM

## 2020-08-27 DIAGNOSIS — R2681 Unsteadiness on feet: Secondary | ICD-10-CM | POA: Diagnosis not present

## 2020-08-27 DIAGNOSIS — M6281 Muscle weakness (generalized): Secondary | ICD-10-CM

## 2020-08-27 NOTE — Therapy (Signed)
[place], [place], [address] Phone: [phone]   Fax:  [phone]  Physical Therapy Treatment  Patient Details  Name: [name] Pratt MRN: [id number] Date of Birth: 28-Dec-1940 Referring Provider (PT): [name], MD   Encounter Date: 08/27/2020   PT End of Session - 08/27/20 1851    Visit Number 5    Number of Visits 12    Date for PT Re-Evaluation 09/13/20    Authorization Type [name]    Authorization - Visit Number 5    Authorization - Number of Visits 12    Progress Note Due on Visit 10    PT Start Time 1104    PT Stop Time 1145    PT Time Calculation (min) 41 min    Equipment Utilized During Treatment Gait belt    Activity Tolerance Patient tolerated treatment well    Behavior During Therapy WFL for tasks assessed/performed           Past Medical History:  Diagnosis Date  . Aortic atherosclerosis ([place]) 03/05/2020  . Arthritis   . Cancer ([place])    skin cancer  . Carotid stenosis    a. Carotid U/S 3/50: LICA < 09%, RICA 38-18%;  b.  Carotid U/S 2/99:  RICA 37-16%; LICA 9-67%; f/u 1 year  . Chest pain     Myoview in 2008 was normal.  Echo 7/09: EF 60%, normal wall motion, mild LVH, mild LAE.  [name] Complication of anesthesia    "stomach does not wake up"  . COPD (chronic obstructive pulmonary disease) ([place])   . Emphysema, unspecified ([place]) 03/05/2020  . GERD (gastroesophageal reflux disease)   . Gout   . History of chicken pox   . History of hemorrhoids   . History of kidney stones   . Hyperlipidemia    under control  . Hypertension    under control  . Hypothyroidism   . Ileus ([place])   . Inguinal hernia   . OSA (obstructive sleep apnea)   . Pancreatitis   . PONV (postoperative nausea and vomiting)   . Rosacea   . 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    Past Surgical History:  Procedure Laterality Date  . ABDOMINAL  HERNIA REPAIR  07/15/12   Dr [name]  . ANTERIOR CERVICAL DECOMP/DISCECTOMY FUSION N/A 07/09/2020   Procedure: ANTERIOR CERVICAL DECOMPRESSION AND FUSION CERVICAL THREE-FOUR.;  Surgeon: [name], MD;  Location: [facility];  Service: Neurosurgery;  Laterality: N/A;  anterior  . BROW LIFT  05/07/01  . CYSTOSCOPY WITH URETEROSCOPY AND STENT PLACEMENT Right 01/28/2014   Procedure: CYSTOSCOPY WITH URETEROSCOPY, BASKET RETRIVAL AND  STENT PLACEMENT;  Surgeon: [name], MD;  Location[facility];  Service: Urology;  Laterality: Right;  . epidural injections     multiple procedures  . [place]   multiple times  . EYE SURGERY Bilateral 03/25/10, 2012   cataract removal  . HEMORRHOID SURGERY  2014  . HIATAL HERNIA REPAIR  12/21/93  . HOLMIUM LASER APPLICATION Right 8/93/8101   Procedure: HOLMIUM LASER APPLICATION;  Surgeon: [name], MD;  Location[facility];  Service: Urology;  Laterality: Right;  . INGUINAL HERNIA REPAIR Right 07/15/12   Dr [name], x2  . JOINT REPLACEMENT  07/25/04   right knee  . JOINT REPLACEMENT  07/13/10   left knee  . KNEE SURGERY  1995  . LAPAROSCOPIC  CHOLECYSTECTOMY  09/20/06   with intraoperative cholangiogram and right inguinal herniorrhaphy with mesh   . LIGAMENT REPAIR Left 07/2013   shoulder  . LITHOTRIPSY     x2  . NASAL SEPTUM SURGERY  1975  . REFRACTIVE SURGERY Bilateral   . 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 SKIN CANCER DESTRUCTION     nose, ear  . TONSILLECTOMY  as child  . TOTAL HIP ARTHROPLASTY Left   . URETHRAL DILATION  1991   [name]    There were no vitals filed for this visit.   Subjective Assessment - 08/27/20 1116    Subjective Today is good day.  He had no cramps in the night and slept through the night.  He has been working on side stepping up his 14 stairs to man cave; it is going better.    Pertinent History neuropathy, HTN, lumbar DDD, hypothyroidism, carotid stenosis    Patient Stated Goals "I want my leg to  get better and walk without limping."    Currently in Pain? No/denies    Pain Score 0-No pain           [facility] Adult PT Treatment/Exercise - 08/27/20 0001      Knee/Hip Exercises: Stretches   Piriformis Stretch Right;20 seconds;2 reps   supine fig 4   Other Knee/Hip Stretches butterfly stretch supine x 20 sec x 2 reps      Knee/Hip Exercises: Standing   Lateral Step Up Right;1 set;10 reps;Hand Hold: 2;Step Height: 6"    Wall Squat 1 set;10 reps   quarter squat   Other Standing Knee Exercises standing to/from supine with RUE assis.      Knee/Hip Exercises: Seated   Sit to Sand 1 set;without UE support;5 reps   eccentric lowering; arms forward, core engaged.      Knee/Hip Exercises: Supine   Other Supine Knee/Hip Exercises supine marching with controlled descent of LE x 15 reps      Knee/Hip Exercises: Sidelying   Hip ABduction Right;2 sets;5 reps    Clams 10 reps R side. cues for core engaged.  limited range.            Balance Exercises - 08/27/20 0001      Balance Exercises: Standing   Partial Tandem Stance Eyes open;2 reps;20 secs   CGA to min A for Rt lateral lean   Sidestepping Upper extremity support;2 reps    Marching Upper extremity assist 1;Dynamic;Forwards;Retro;5 reps            RLE hip abdct strength 3+ to 4-/5.        PT Long Term Goals - 08/27/20 1120      PT LONG TERM GOAL #1   Title The patient will be indep with HEP.    Time 6    Period Weeks    Status On-going      PT LONG TERM GOAL #2   Title The patient will improve R hip abduction to 4/5.    Time 6    Period Weeks    Status On-going      PT LONG TERM GOAL #3   Title The patient will improve gait speed from 2.02 ft/sec to > or equal to 2.62 ft/sec to demo improved community moiblity.    Time 6    Period Weeks    Status On-going      PT LONG TERM GOAL #4   Title The patient will maintain single leg stance x 5 seconds bilat.  Time 6    Period Weeks    Status On-going      PT  LONG TERM GOAL #5   Title The patient will return demo floor <> stand with one UE support on surface to rise.    Time 6    Period Weeks    Status Achieved      PT LONG TERM GOAL #6   Title The patient will demonstrate improved knee control demonstrating recurbatum < 25% of gait x 200 ft.    Time 6    Period Weeks    Status On-going                 Plan - 08/27/20 1136    Clinical Impression Statement Mild improvements observed with gait quality and Rt abdct strength. RLE continues to fatigue quickly with hip abdct exercises.  Pt able to keep knee slightly flexed to prevent recurvatum with standing exercises with occasional tactile cues to prevent. Progressing gradually towards goals.    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    Rehab Potential Good    PT Frequency 2x / week    PT Duration 6 weeks    PT Treatment/Interventions ADLs/Self Care Home Management;Gait training;Stair training;Functional mobility training;Therapeutic activities;Therapeutic exercise;Balance training;Neuromuscular re-education;Manual techniques;Patient/family education;Taping;Electrical Stimulation;Orthotic Fit/Training    PT Next Visit Plan R knee control activities, strengthening LEs, standing balance.    PT Home Exercise Plan Access Code: [email or web address]    Consulted and Agree with Plan of Care Patient           Patient will benefit from skilled therapeutic intervention in order to improve the following deficits and impairments:  Abnormal gait,Impaired flexibility,Decreased strength,Difficulty walking,Decreased activity tolerance,Decreased balance,Impaired sensation  Visit Diagnosis: Muscle weakness  (generalized)  Other abnormalities of gait and mobility  Other symptoms and signs involving the nervous system  Unsteadiness on feet     Problem List Patient Active Problem List   Diagnosis Date Noted  . Cystoid macular edema of both eyes 07/28/2020  . Status post cervical spinal fusion 07/09/2020  . History of total bilateral knee replacement 07/08/2020  . Aortic atherosclerosis ([place]) 03/05/2020  . Emphysema, unspecified ([place]) 03/05/2020  . SIRS (systemic inflammatory response syndrome) ([place]) 03/04/2020  . Heme positive stool 02/29/2020  . Leg cramps 11/13/2019  . Tibialis anterior tendon tear, nontraumatic 11/13/2019  . 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 CPAP (continuous positive airway pressure) dependence 03/25/2018  . 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 Acute idiopathic gout of hand 03/25/2018  . Erectile dysfunction 06/20/2017  . 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 Peripheral neuropathy 06/19/2016  . Hearing loss 02/02/2016  . 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 Allergic rhinitis 02/02/2016  . Atypical chest pain 11/24/2015  . Preventative health care 11/24/2015  . Onychomycosis 06/30/2015  . Degenerative disc disease, lumbar 06/30/2015  . Other allergic rhinitis 02/18/2015  . Deviated nasal septum 02/18/2015  . OSA on CPAP 02/18/2015  . RLS (restless legs syndrome) 02/18/2015  . GERD (gastroesophageal reflux disease) 09/18/2014  . Hyperglycemia 03/03/2014  . Sleep apnea with use of continuous positive airway pressure (CPAP) 07/15/2013  . Carotid stenosis 02/24/2013  . Nonspecific abnormal electrocardiogram (ECG) (EKG) 01/06/2013  . DJD (degenerative joint disease) of hip 12/24/2012  .  Lower GI bleed 12/12/2012  . Dyspnea 06/12/2011  . Hypothyroidism 05/13/2009  . HYPERCHOLESTEROLEMIA 05/13/2009  . Hyperlipidemia 05/13/2009  . 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  . Essential hypertension 05/13/2009  .  INGUINAL HERNIA 05/13/2009  . HIATAL HERNIA 05/13/2009  . NEPHROLITHIASIS 05/13/2009  . ROSACEA 05/13/2009   Kerin Perna, PTA 08/27/20 7:06 PM  [place], [place], [address] Phone: [phone]   Fax:  [phone]  Name: [name] Pratt MRN: [id number] Date of Birth: May 10, 1941

## 2020-08-30 ENCOUNTER — Ambulatory Visit (HOSPITAL_BASED_OUTPATIENT_CLINIC_OR_DEPARTMENT_OTHER)
Admission: RE | Admit: 2020-08-30 | Discharge: 2020-08-30 | Disposition: A | Discharge status: Home / Self Care | Payer: Medicare HMO | Source: Ambulatory Visit | Attending: Family | Admitting: Family

## 2020-08-30 ENCOUNTER — Encounter (HOSPITAL_BASED_OUTPATIENT_CLINIC_OR_DEPARTMENT_OTHER): Payer: Self-pay

## 2020-08-30 ENCOUNTER — Other Ambulatory Visit: Payer: Self-pay

## 2020-08-30 ENCOUNTER — Other Ambulatory Visit: Payer: Self-pay | Admitting: Family

## 2020-08-30 DIAGNOSIS — N200 Calculus of kidney: Secondary | ICD-10-CM | POA: Diagnosis not present

## 2020-08-30 DIAGNOSIS — K5730 Diverticulosis of large intestine without perforation or abscess without bleeding: Secondary | ICD-10-CM | POA: Diagnosis not present

## 2020-08-30 DIAGNOSIS — R634 Abnormal weight loss: Secondary | ICD-10-CM

## 2020-08-30 DIAGNOSIS — N281 Cyst of kidney, acquired: Secondary | ICD-10-CM | POA: Diagnosis not present

## 2020-08-30 DIAGNOSIS — I700 Atherosclerosis of aorta: Secondary | ICD-10-CM | POA: Diagnosis not present

## 2020-08-30 DIAGNOSIS — J984 Other disorders of lung: Secondary | ICD-10-CM | POA: Diagnosis not present

## 2020-08-30 DIAGNOSIS — R918 Other nonspecific abnormal finding of lung field: Secondary | ICD-10-CM | POA: Diagnosis not present

## 2020-08-30 DIAGNOSIS — M47816 Spondylosis without myelopathy or radiculopathy, lumbar region: Secondary | ICD-10-CM | POA: Diagnosis not present

## 2020-08-30 MED ORDER — IOHEXOL 300 MG/ML  SOLN
100.0000 mL | Freq: Once | INTRAMUSCULAR | Status: AC | PRN
  Administered 2020-08-30: 08:00:00 100 mL via INTRAVENOUS

## 2020-09-01 ENCOUNTER — Ambulatory Visit: Payer: Medicare HMO | Admitting: Rehabilitative and Restorative Service Providers"

## 2020-09-01 ENCOUNTER — Other Ambulatory Visit: Payer: Self-pay

## 2020-09-01 DIAGNOSIS — M6281 Muscle weakness (generalized): Secondary | ICD-10-CM

## 2020-09-01 DIAGNOSIS — R2689 Other abnormalities of gait and mobility: Secondary | ICD-10-CM

## 2020-09-01 DIAGNOSIS — R2681 Unsteadiness on feet: Secondary | ICD-10-CM | POA: Diagnosis not present

## 2020-09-01 DIAGNOSIS — R29818 Other symptoms and signs involving the nervous system: Secondary | ICD-10-CM

## 2020-09-01 NOTE — Therapy (Signed)
[place], [place], [address] Phone: [phone]   Fax:  [phone]  Physical Therapy Treatment  Patient Details  Name: [name] Pratt MRN: [id number] Date of Birth: 09-30-1940 Referring Provider (PT): [name], MD   Encounter Date: 09/01/2020   PT End of Session - 09/01/20 0840    Visit Number 6    Number of Visits 12    Date for PT Re-Evaluation 09/13/20    Authorization Type [name]    Authorization - Visit Number 6    Authorization - Number of Visits 12    Progress Note Due on Visit 10    PT Start Time [phone]    PT Stop Time 0915    PT Time Calculation (min) 42 min    Equipment Utilized During Treatment Gait belt    Activity Tolerance Patient tolerated treatment well    Behavior During Therapy [facility] for tasks assessed/performed           Past Medical History:  Diagnosis Date  . Aortic atherosclerosis ([place]) 03/05/2020  . Arthritis   . Cancer ([place])    skin cancer  . Carotid stenosis    a. Carotid U/S 0/26: LICA < 37%, RICA 85-88%;  b.  Carotid U/S 5/02:  RICA 77-41%; LICA 2-87%; f/u 1 year  . Chest pain     Myoview in 2008 was normal.  Echo 7/09: EF 60%, normal wall motion, mild LVH, mild LAE.  [name] Complication of anesthesia    "stomach does not wake up"  . COPD (chronic obstructive pulmonary disease) ([place])   . Emphysema, unspecified ([place]) 03/05/2020  . GERD (gastroesophageal reflux disease)   . Gout   . History of chicken pox   . History of hemorrhoids   . History of kidney stones   . Hyperlipidemia    under control  . Hypertension    under control  . Hypothyroidism   . Ileus ([place])   . Inguinal hernia   . OSA (obstructive sleep apnea)   . Pancreatitis   . PONV (postoperative nausea and vomiting)   . Rosacea   . 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    Past Surgical History:  Procedure Laterality Date  . ABDOMINAL  HERNIA REPAIR  07/15/12   Dr [name]  . ANTERIOR CERVICAL DECOMP/DISCECTOMY FUSION N/A 07/09/2020   Procedure: ANTERIOR CERVICAL DECOMPRESSION AND FUSION CERVICAL THREE-FOUR.;  Surgeon: [name], MD;  Location: [facility];  Service: Neurosurgery;  Laterality: N/A;  anterior  . BROW LIFT  05/07/01  . CYSTOSCOPY WITH URETEROSCOPY AND STENT PLACEMENT Right 01/28/2014   Procedure: CYSTOSCOPY WITH URETEROSCOPY, BASKET RETRIVAL AND  STENT PLACEMENT;  Surgeon: [name], MD;  Location[facility];  Service: Urology;  Laterality: Right;  . epidural injections     multiple procedures  . [place]   multiple times  . EYE SURGERY Bilateral 03/25/10, 2012   cataract removal  . HEMORRHOID SURGERY  2014  . HIATAL HERNIA REPAIR  12/21/93  . HOLMIUM LASER APPLICATION Right 8/67/6720   Procedure: HOLMIUM LASER APPLICATION;  Surgeon: [name], MD;  [facility];  Service: Urology;  Laterality: Right;  . INGUINAL HERNIA REPAIR Right 07/15/12   Dr [name], x2  . JOINT REPLACEMENT  07/25/04   right knee  . JOINT REPLACEMENT  07/13/10   left knee  . KNEE SURGERY  1995  . LAPAROSCOPIC  CHOLECYSTECTOMY  09/20/06   with intraoperative cholangiogram and right inguinal herniorrhaphy with mesh   . LIGAMENT REPAIR Left 07/2013   shoulder  . LITHOTRIPSY     x2  . NASAL SEPTUM SURGERY  1975  . REFRACTIVE SURGERY Bilateral   . 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 SKIN CANCER DESTRUCTION     nose, ear  . TONSILLECTOMY  as child  . TOTAL HIP ARTHROPLASTY Left   . URETHRAL DILATION  1991   Dr. Annabell Howells    There were no vitals filed for this visit.   Subjective Assessment - 09/01/20 0833    Subjective The patient is doing exercises and feels some improvement.  "It comes and goes" feeling that days can be variable.    Pertinent History neuropathy, HTN, lumbar DDD, hypothyroidism, carotid stenosis    Patient Stated Goals "I want my leg to get better and walk without limping."    Currently  in Pain? No/denies              [facility] PT Assessment - 09/01/20 0909      Assessment   Medical Diagnosis s/p cervical myelopathy, gait training focus only    Referring Provider (PT) Donalee Citrin, MD    Onset Date/Surgical Date 07/09/20                         [facility] Adult PT Treatment/Exercise - 09/01/20 0910      Ambulation/Gait   Ambulation/Gait Yes    Ambulation/Gait Assistance 6: Modified independent (Device/Increase time)    Ambulation Distance (Feet) 75 Feet    Assistive device None    Gait Comments gait with cues for R knee flexion      Neuro Re-ed    Neuro Re-ed Details  R knee control in standing with L LE on 6" step, moving R LE between 12" and 6" surfaces working on speed and coordination of movement.  Foam standing with lateral sidestepping.  Rocker board with lateral and ant/posterior directions with reactive balance strategies.      Exercises   Exercises Knee/Hip      Knee/Hip Exercises: Stretches   Quad Stretch Right;Left;3 reps;30 seconds      Knee/Hip Exercises: Prone   Hamstring Curl 5 reps;1 set    Hamstring Curl Limitations hamstring cramping                  PT Education - 09/01/20 1131    Education Details HEP    Person(s) Educated Patient    Methods Explanation;Demonstration;Handout    Comprehension Verbalized understanding;Returned demonstration               PT Long Term Goals - 08/27/20 1120      PT LONG TERM GOAL #1   Title The patient will be indep with HEP.    Time 6    Period Weeks    Status On-going      PT LONG TERM GOAL #2   Title The patient will improve R hip abduction to 4/5.    Time 6    Period Weeks    Status On-going      PT LONG TERM GOAL #3   Title The patient will improve gait speed from 2.02 ft/sec to > or equal to 2.62 ft/sec to demo improved community moiblity.    Time 6    Period Weeks    Status On-going      PT LONG TERM GOAL #4  Title The patient will maintain single leg stance  x 5 seconds bilat.    Time 6    Period Weeks    Status On-going      PT LONG TERM GOAL #5   Title The patient will return demo floor <> stand with one UE support on surface to rise.    Time 6    Period Weeks    Status Achieved      PT LONG TERM GOAL #6   Title The patient will demonstrate improved knee control demonstrating recurbatum < 25% of gait x 200 ft.    Time 6    Period Weeks    Status On-going                 Plan - 09/01/20 1133    Clinical Impression Statement The patient notes mild improvements since beginning PT. He has significant quad tightness and HS cramping with knee curls.  PT to continue to progress anticipating renewal in early January to continue at 1x/week frequency to progress mobility.    PT Treatment/Interventions ADLs/Self Care Home Management;Gait training;Stair training;Functional mobility training;Therapeutic activities;Therapeutic exercise;Balance training;Neuromuscular re-education;Manual techniques;Patient/family education;Taping;Electrical Stimulation;Orthotic Fit/Training    PT Next Visit Plan R knee control activities, strengthening LEs, standing balance.    PT Home Exercise Plan Access Code: [email or web address]    Consulted and Agree with Plan of Care Patient           Patient will benefit from skilled therapeutic intervention in order to improve the following deficits and impairments:     Visit Diagnosis: Muscle weakness (generalized)  Other abnormalities of gait and mobility  Other symptoms and signs involving the nervous system  Unsteadiness on feet     Problem List Patient Active Problem List   Diagnosis Date Noted  . Cystoid macular edema of both eyes 07/28/2020  . Status post cervical spinal fusion 07/09/2020  . History of total bilateral knee replacement 07/08/2020  . Aortic atherosclerosis ([place]) 03/05/2020  . Emphysema, unspecified ([place]) 03/05/2020  . SIRS (systemic inflammatory response syndrome) ([place]) 03/04/2020  . Heme  positive stool 02/29/2020  . Leg cramps 11/13/2019  . Tibialis anterior tendon tear, nontraumatic 11/13/2019  . 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 CPAP (continuous positive airway pressure) dependence 03/25/2018  . 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 Acute idiopathic gout of hand 03/25/2018  . Erectile dysfunction 06/20/2017  . 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 Peripheral neuropathy 06/19/2016  . Hearing loss 02/02/2016  . 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 Allergic rhinitis 02/02/2016  . Atypical chest pain 11/24/2015  . Preventative health care 11/24/2015  . Onychomycosis 06/30/2015  . Degenerative disc disease, lumbar 06/30/2015  . Other allergic rhinitis 02/18/2015  . Deviated nasal septum 02/18/2015  . OSA on CPAP 02/18/2015  . RLS (restless legs syndrome) 02/18/2015  . GERD (gastroesophageal reflux disease) 09/18/2014  . Hyperglycemia 03/03/2014  . Sleep apnea with use of continuous positive airway pressure (CPAP) 07/15/2013  . Carotid stenosis 02/24/2013  . Nonspecific abnormal electrocardiogram (ECG) (EKG) 01/06/2013  . DJD (degenerative joint disease) of hip 12/24/2012  . Lower GI bleed 12/12/2012  . Dyspnea 06/12/2011  . Hypothyroidism 05/13/2009  . HYPERCHOLESTEROLEMIA 05/13/2009  . Hyperlipidemia 05/13/2009  . 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  . Essential hypertension 05/13/2009  . INGUINAL HERNIA 05/13/2009  . HIATAL HERNIA 05/13/2009  . NEPHROLITHIASIS 05/13/2009  . ROSACEA 05/13/2009    Naitik Hermann, PT 09/01/2020, 11:44 AM  [facility] [address] [place], [place], [address] Phone: [phone]   Fax:  [phone]  Name: [name] Pratt MRN: [id number] Date of Birth: 1941-01-20

## 2020-09-01 NOTE — Patient Instructions (Signed)
Access Code: [email or web address] URL: [email or web address] Date: 09/01/2020 Prepared by: [name]  Exercises Squat with Chair Touch - 2 x daily - 7 x weekly - 1 sets - 10 reps Side Stepping with Counter Support - 1 x daily - 3 x weekly - 1 sets - 10 reps Beginner Clam - 2 x daily - 7 x weekly - 1 sets - 10 reps Straight Leg Raise - 2 x daily - 7 x weekly - 1 sets - 5-10 reps Supine Butterfly Groin Stretch - 2 x daily - 7 x weekly - 1 sets - 3 reps - 30 seconds hold Beginner Bridge - 1 x daily - 7 x weekly - 2 sets - 5 reps Supine Piriformis Stretch with Foot on Ground - 1 x daily - 7 x weekly - 1 sets - 2 reps - 30 sec hold

## 2020-09-07 ENCOUNTER — Ambulatory Visit: Payer: Medicare HMO | Admitting: Physical Therapy

## 2020-09-07 ENCOUNTER — Other Ambulatory Visit: Payer: Self-pay

## 2020-09-07 DIAGNOSIS — R2681 Unsteadiness on feet: Secondary | ICD-10-CM | POA: Diagnosis not present

## 2020-09-07 DIAGNOSIS — R2689 Other abnormalities of gait and mobility: Secondary | ICD-10-CM | POA: Diagnosis not present

## 2020-09-07 DIAGNOSIS — M6281 Muscle weakness (generalized): Secondary | ICD-10-CM

## 2020-09-07 DIAGNOSIS — R29818 Other symptoms and signs involving the nervous system: Secondary | ICD-10-CM

## 2020-09-07 NOTE — Therapy (Signed)
[facility] [place] [address]  [address] [place], [place], [address] Phone: [phone]   Fax:  [phone]  Physical Therapy Treatment  Patient Details  Name: [name] Pratt MRN: [id number] Date of Birth: 02/09/1941 Referring Provider (PT): Donalee Citrin, MD   Encounter Date: 09/07/2020   PT End of Session - 09/07/20 1148    Visit Number 7    Number of Visits 12    Date for PT Re-Evaluation 09/13/20    Authorization Type [name]    Authorization - Visit Number 7    Authorization - Number of Visits 12    Progress Note Due on Visit 10    PT Start Time 1102    PT Stop Time 1144    PT Time Calculation (min) 42 min    Equipment Utilized During Treatment Gait belt    Activity Tolerance Patient tolerated treatment well    Behavior During Therapy WFL for tasks assessed/performed           Past Medical History:  Diagnosis Date  . Aortic atherosclerosis (HCC) 03/05/2020  . Arthritis   . Cancer (HCC)    skin cancer  . Carotid stenosis    a. Carotid U/S 5/13: LICA < 50%, RICA 50-69%;  b.  Carotid U/S 5/14:  RICA 40-59%; LICA 0-39%; f/u 1 year  . Chest pain     Myoview in 2008 was normal.  Echo 7/09: EF 60%, normal wall motion, mild LVH, mild LAE.  [name] Complication of anesthesia    "stomach does not wake up"  . COPD (chronic obstructive pulmonary disease) (HCC)   . Emphysema, unspecified (HCC) 03/05/2020  . GERD (gastroesophageal reflux disease)   . Gout   . History of chicken pox   . History of hemorrhoids   . History of kidney stones   . Hyperlipidemia    under control  . Hypertension    under control  . Hypothyroidism   . Ileus (HCC)   . Inguinal hernia   . OSA (obstructive sleep apnea)   . Pancreatitis   . PONV (postoperative nausea and vomiting)   . Rosacea   . 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 Fawn Kirk    Past Surgical History:  Procedure Laterality Date  . ABDOMINAL  HERNIA REPAIR  07/15/12   Dr Ashley Jacobs  . ANTERIOR CERVICAL DECOMP/DISCECTOMY FUSION N/A 07/09/2020   Procedure: ANTERIOR CERVICAL DECOMPRESSION AND FUSION CERVICAL THREE-FOUR.;  Surgeon: Donalee Citrin, MD;  Location: [facility] OR;  Service: Neurosurgery;  Laterality: N/A;  anterior  . BROW LIFT  05/07/01  . CYSTOSCOPY WITH URETEROSCOPY AND STENT PLACEMENT Right 01/28/2014   Procedure: CYSTOSCOPY WITH URETEROSCOPY, BASKET RETRIVAL AND  STENT PLACEMENT;  Surgeon: Valetta Fuller, MD;  Location: [facility] ORS;  Service: Urology;  Laterality: Right;  . epidural injections     multiple procedures  . ESOPHAGEAL DILATION  1993 and 1994   multiple times  . EYE SURGERY Bilateral 03/25/10, 2012   cataract removal  . HEMORRHOID SURGERY  2014  . HIATAL HERNIA REPAIR  12/21/93  . HOLMIUM LASER APPLICATION Right 01/28/2014   Procedure: HOLMIUM LASER APPLICATION;  Surgeon: Valetta Fuller, MD;  Location: [facility] ORS;  Service: Urology;  Laterality: Right;  . INGUINAL HERNIA REPAIR Right 07/15/12   Dr Ashley Jacobs, x2  . JOINT REPLACEMENT  07/25/04   right knee  . JOINT REPLACEMENT  07/13/10   left knee  . KNEE SURGERY  1995  . LAPAROSCOPIC  CHOLECYSTECTOMY  09/20/06   with intraoperative cholangiogram and right inguinal herniorrhaphy with mesh   . LIGAMENT REPAIR Left 07/2013   shoulder  . LITHOTRIPSY     x2  . NASAL SEPTUM SURGERY  1975  . REFRACTIVE SURGERY Bilateral   . 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 SKIN CANCER DESTRUCTION     nose, ear  . TONSILLECTOMY  as child  . TOTAL HIP ARTHROPLASTY Left   . URETHRAL DILATION  1991   Dr. Annabell Howells    There were no vitals filed for this visit.   Subjective Assessment - 09/07/20 1107    Subjective Pt reports has been doing the exercises daily, and feels the Rt leg is getting stronger but it is still weak.  Biggest problem is putting on pants in the morning (can't bring leg up high enough to get leg in pant leg).    Currently in Pain? Yes    Pain Score 3     Pain Location Back     Pain Orientation Lower;Right;Left    Pain Descriptors / Indicators Dull;Sore    Aggravating Factors  sitting    Pain Relieving Factors getting up and moving.              [facility] PT Assessment - 09/07/20 0001      Assessment   Medical Diagnosis s/p cervical myelopathy, gait training focus only    Referring Provider (PT) Donalee Citrin, MD    Onset Date/Surgical Date 07/09/20    Hand Dominance Right    Next MD Visit 09/09/20    Prior Therapy known to me from prior PT in 01/2020      Strength   Right Hip Flexion 4-/5    Right Hip ABduction 4/5            [facility] Adult PT Treatment/Exercise - 09/07/20 0001      Ambulation/Gait   Ambulation/Gait Assistance 6: Modified independent (Device/Increase time)    Ambulation Distance (Feet) 80 Feet   2 reps   Assistive device None    Gait Comments tactile/VC to increase Rt hip flexion prior to swing through      Neuro Re-ed    Neuro Re-ed Details  R knee control in standing with L LE on 6" step, moving R LE between 12" and 6" surfaces working on speed and coordination of movement. Standing on RLE and LLE to 6" step.  lateral sidestepping over 1/2 foam roller.  Rocker board with lateral and ant/posterior directions with reactive balance strategies.      Knee/Hip Exercises: Stretches   Passive Hamstring Stretch Right;Left;1 rep;30 seconds    Piriformis Stretch Right;1 rep;30 seconds      Knee/Hip Exercises: Supine   Straight Leg Raises Strengthening;Right;1 set;10 reps    Straight Leg Raises Limitations cues to keep knee straight      Knee/Hip Exercises: Sidelying   Hip ABduction Strengthening;Right;1 set;10 reps    Clams 10 reps R side. cues for core engaged.  limited range.            Balance Exercises - 09/07/20 0001      Balance Exercises: Standing   SLS Eyes open;Upper extremity support 2;4 reps   5 sec each rep                 PT Long Term Goals - 08/27/20 1120      PT LONG TERM GOAL #1   Title The patient will be  indep with HEP.  Time 6    Period Weeks    Status On-going      PT LONG TERM GOAL #2   Title The patient will improve R hip abduction to 4/5.    Time 6    Period Weeks    Status On-going      PT LONG TERM GOAL #3   Title The patient will improve gait speed from 2.02 ft/sec to > or equal to 2.62 ft/sec to demo improved community moiblity.    Time 6    Period Weeks    Status On-going      PT LONG TERM GOAL #4   Title The patient will maintain single leg stance x 5 seconds bilat.    Time 6    Period Weeks    Status On-going      PT LONG TERM GOAL #5   Title The patient will return demo floor <> stand with one UE support on surface to rise.    Time 6    Period Weeks    Status Achieved      PT LONG TERM GOAL #6   Title The patient will demonstrate improved knee control demonstrating recurbatum < 25% of gait x 200 ft.    Time 6    Period Weeks    Status On-going                 Plan - 09/07/20 1305    Clinical Impression Statement Good improvement in RLE strength with MMT, however still presents with functional weakness with dynamic/static standing exercises.  Pt has made steady progress towards LTGs.  Will continue to progress mobility and balance. Supervising PT anticipates renewal at next visit with decrease in frequency to 1x/wk per last note.    Rehab Potential Good    PT Frequency 2x / week    PT Duration 6 weeks    PT Treatment/Interventions ADLs/Self Care Home Management;Gait training;Stair training;Functional mobility training;Therapeutic activities;Therapeutic exercise;Balance training;Neuromuscular re-education;Manual techniques;Patient/family education;Taping;Electrical Stimulation;Orthotic Fit/Training    PT Next Visit Plan R knee control activities, strengthening LEs, standing balance. End of POC    PT Home Exercise Plan Access Code: [email or web address]           Patient will benefit from skilled therapeutic intervention in order to improve the following  deficits and impairments:     Visit Diagnosis: Muscle weakness (generalized)  Other abnormalities of gait and mobility  Other symptoms and signs involving the nervous system  Unsteadiness on feet     Problem List Patient Active Problem List   Diagnosis Date Noted  . Cystoid macular edema of both eyes 07/28/2020  . Status post cervical spinal fusion 07/09/2020  . History of total bilateral knee replacement 07/08/2020  . Aortic atherosclerosis ([place]) 03/05/2020  . Emphysema, unspecified ([place]) 03/05/2020  . SIRS (systemic inflammatory response syndrome) ([place]) 03/04/2020  . Heme positive stool 02/29/2020  . Leg cramps 11/13/2019  . Tibialis anterior tendon tear, nontraumatic 11/13/2019  . 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 CPAP (continuous positive airway pressure) dependence 03/25/2018  . 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 Acute idiopathic gout of hand 03/25/2018  . Erectile dysfunction 06/20/2017  . 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 Peripheral neuropathy 06/19/2016  . Hearing loss 02/02/2016  . 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 Allergic rhinitis 02/02/2016  . Atypical chest pain 11/24/2015  . Preventative health care 11/24/2015  . Onychomycosis 06/30/2015  . Degenerative disc disease,  lumbar 06/30/2015  . Other allergic rhinitis 02/18/2015  . Deviated nasal septum 02/18/2015  . OSA on CPAP 02/18/2015  . RLS (restless legs syndrome) 02/18/2015  . GERD (gastroesophageal reflux disease) 09/18/2014  . Hyperglycemia 03/03/2014  . Sleep apnea with use of continuous positive airway pressure (CPAP) 07/15/2013  . Carotid stenosis 02/24/2013  . Nonspecific abnormal electrocardiogram (ECG) (EKG) 01/06/2013  . DJD (degenerative joint disease) of hip 12/24/2012  . Lower GI bleed 12/12/2012  . Dyspnea 06/12/2011  . Hypothyroidism 05/13/2009  . HYPERCHOLESTEROLEMIA 05/13/2009  . Hyperlipidemia 05/13/2009  .  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  . Essential hypertension 05/13/2009  . INGUINAL HERNIA 05/13/2009  . HIATAL HERNIA 05/13/2009  . NEPHROLITHIASIS 05/13/2009  . ROSACEA 05/13/2009   Kerin Perna, PTA 09/07/20 1:13 PM  [facility] [place], [place], [address] Phone: [phone]   Fax:  [phone]  Name: [name] Pratt MRN: [id number] Date of Birth: 03-19-1941

## 2020-09-08 ENCOUNTER — Emergency Department (HOSPITAL_BASED_OUTPATIENT_CLINIC_OR_DEPARTMENT_OTHER): Payer: Medicare HMO

## 2020-09-08 ENCOUNTER — Encounter (HOSPITAL_BASED_OUTPATIENT_CLINIC_OR_DEPARTMENT_OTHER): Payer: Self-pay | Admitting: *Deleted

## 2020-09-08 ENCOUNTER — Other Ambulatory Visit: Payer: Self-pay

## 2020-09-08 ENCOUNTER — Emergency Department (HOSPITAL_BASED_OUTPATIENT_CLINIC_OR_DEPARTMENT_OTHER)
Admission: EM | Admit: 2020-09-08 | Discharge: 2020-09-08 | Disposition: A | Discharge status: Home / Self Care | Payer: Medicare HMO | Attending: Emergency Medicine | Admitting: Emergency Medicine

## 2020-09-08 DIAGNOSIS — M542 Cervicalgia: Secondary | ICD-10-CM | POA: Diagnosis not present

## 2020-09-08 DIAGNOSIS — Z96653 Presence of artificial knee joint, bilateral: Secondary | ICD-10-CM | POA: Insufficient documentation

## 2020-09-08 DIAGNOSIS — J449 Chronic obstructive pulmonary disease, unspecified: Secondary | ICD-10-CM | POA: Diagnosis not present

## 2020-09-08 DIAGNOSIS — E039 Hypothyroidism, unspecified: Secondary | ICD-10-CM | POA: Insufficient documentation

## 2020-09-08 DIAGNOSIS — M791 Myalgia, unspecified site: Secondary | ICD-10-CM | POA: Insufficient documentation

## 2020-09-08 DIAGNOSIS — Z87891 Personal history of nicotine dependence: Secondary | ICD-10-CM | POA: Diagnosis not present

## 2020-09-08 DIAGNOSIS — Z85828 Personal history of other malignant neoplasm of skin: Secondary | ICD-10-CM | POA: Diagnosis not present

## 2020-09-08 DIAGNOSIS — Z96641 Presence of right artificial hip joint: Secondary | ICD-10-CM | POA: Diagnosis not present

## 2020-09-08 DIAGNOSIS — S060X0A Concussion without loss of consciousness, initial encounter: Secondary | ICD-10-CM | POA: Insufficient documentation

## 2020-09-08 DIAGNOSIS — Z79899 Other long term (current) drug therapy: Secondary | ICD-10-CM | POA: Insufficient documentation

## 2020-09-08 DIAGNOSIS — S0990XA Unspecified injury of head, initial encounter: Secondary | ICD-10-CM | POA: Diagnosis present

## 2020-09-08 DIAGNOSIS — W19XXXA Unspecified fall, initial encounter: Secondary | ICD-10-CM

## 2020-09-08 DIAGNOSIS — S0003XA Contusion of scalp, initial encounter: Secondary | ICD-10-CM | POA: Diagnosis not present

## 2020-09-08 DIAGNOSIS — Y9301 Activity, walking, marching and hiking: Secondary | ICD-10-CM | POA: Diagnosis not present

## 2020-09-08 DIAGNOSIS — W010XXA Fall on same level from slipping, tripping and stumbling without subsequent striking against object, initial encounter: Secondary | ICD-10-CM | POA: Insufficient documentation

## 2020-09-08 DIAGNOSIS — I10 Essential (primary) hypertension: Secondary | ICD-10-CM | POA: Diagnosis not present

## 2020-09-08 DIAGNOSIS — M545 Low back pain, unspecified: Secondary | ICD-10-CM | POA: Diagnosis not present

## 2020-09-08 DIAGNOSIS — Y92093 Driveway of other non-institutional residence as the place of occurrence of the external cause: Secondary | ICD-10-CM | POA: Insufficient documentation

## 2020-09-08 DIAGNOSIS — M7918 Myalgia, other site: Secondary | ICD-10-CM

## 2020-09-08 NOTE — ED Triage Notes (Addendum)
C/o fall this am , c/o head injury with abrasion also c/o neck pain , hx neck surgery with hardware

## 2020-09-08 NOTE — ED Notes (Signed)
States at approx 0930hrs today slipped when walking down his driveway, fell and hit his head. Visual examination reveals an area a small raised area at left occipital area, no active bleeding noted, discolored ecchymotic area is noted. Pt denies taking any blood thinners, denies LOC, denies any visual changes at this time. States he has a very mild frontal HA.

## 2020-09-08 NOTE — Discharge Instructions (Signed)
Take Tylenol as needed for pain control.  Do not participate in any sports or any activities that could result in head trauma until you are cleared by your pediatrician,  primary care physician or neurologist.   Please follow with your primary care doctor in the next 2 days for a check-up. They must obtain records for further management.   Do not hesitate to return to the Emergency Department for any new, worsening or concerning symptoms.

## 2020-09-08 NOTE — ED Provider Notes (Signed)
[facility] EMERGENCY DEPARTMENT Provider Note   CSN: [id number] Arrival date & time: 09/08/20  1817     History Chief Complaint  Patient presents with  . Head Injury   HPI   Blood pressure (!) 179/86, pulse 71, temperature 98.5 F (36.9 C), resp. rate 20, height 5' 6.5" (1.689 m), weight 67.1 kg, SpO2 100 %.  [name] Pratt is a 79 y.o. male senting for evaluation status post fall from standing several hours ago.  He was walking down his driveway, the surface of the concrete driveway was slippery with rain and he slipped and fell backwards.  Patient left parietal aspect of his head there was no loss of consciousness, he not anticoagulated, he has a mild global headache with no change in vision, dysarthria, ataxia, unilateral weakness.  Patient had mild posterior cervical lateral pain after the fall.  No midline pain or numbness or weakness.  He is worried about the hardware because he status post cervical surgery in the remote past.  He is going to follow with his neurosurgeon tomorrow.  He denies any chest pain, abdominal pain he has some mild low back pain and hip soreness but is ambulatory at his baseline.  He did not fall on outstretched hands.  No ankle knee or significant hip pain at this time.    Past Medical History:  Diagnosis Date  . Aortic atherosclerosis (HCC) 03/05/2020  . Arthritis   . Cancer (HCC)    skin cancer  . Carotid stenosis    a. Carotid U/S 5/13: LICA < 50%, RICA 50-69%;  b.  Carotid U/S 5/14:  RICA 40-59%; LICA 0-39%; f/u 1 year  . Chest pain     Myoview in 2008 was normal.  Echo 7/09: EF 60%, normal wall motion, mild LVH, mild LAE.  [name] Complication of anesthesia    "stomach does not wake up"  . COPD (chronic obstructive pulmonary disease) (HCC)   . Emphysema, unspecified (HCC) 03/05/2020  . GERD (gastroesophageal reflux disease)   . Gout   . History of chicken pox   . History of hemorrhoids   . History of kidney stones   . Hyperlipidemia     under control  . Hypertension    under control  . Hypothyroidism   . Ileus (HCC)   . Inguinal hernia   . OSA (obstructive sleep apnea)   . Pancreatitis   . PONV (postoperative nausea and vomiting)   . Rosacea   . 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 Fawn Kirk    Patient Active Problem List   Diagnosis Date Noted  . Cystoid macular edema of both eyes 07/28/2020  . Status post cervical spinal fusion 07/09/2020  . History of total bilateral knee replacement 07/08/2020  . Aortic atherosclerosis (HCC) 03/05/2020  . Emphysema, unspecified (HCC) 03/05/2020  . SIRS (systemic inflammatory response syndrome) (HCC) 03/04/2020  . Heme positive stool 02/29/2020  . Leg cramps 11/13/2019  . Tibialis anterior tendon tear, nontraumatic 11/13/2019  . 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 CPAP (continuous positive airway pressure) dependence 03/25/2018  . 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 Acute idiopathic gout of hand 03/25/2018  . Erectile dysfunction 06/20/2017  . 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 Peripheral neuropathy 06/19/2016  . Hearing loss 02/02/2016  . 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 Allergic rhinitis 02/02/2016  . Atypical chest pain 11/24/2015  . Preventative health  care 11/24/2015  . Onychomycosis 06/30/2015  . Degenerative disc disease, lumbar 06/30/2015  . Other allergic rhinitis 02/18/2015  . Deviated nasal septum 02/18/2015  . OSA on CPAP 02/18/2015  . RLS (restless legs syndrome) 02/18/2015  . GERD (gastroesophageal reflux disease) 09/18/2014  . Hyperglycemia 03/03/2014  . Sleep apnea with use of continuous positive airway pressure (CPAP) 07/15/2013  . Carotid stenosis 02/24/2013  . Nonspecific abnormal electrocardiogram (ECG) (EKG) 01/06/2013  . DJD (degenerative joint disease) of hip 12/24/2012  . Lower GI bleed 12/12/2012  . Dyspnea 06/12/2011  .  Hypothyroidism 05/13/2009  . HYPERCHOLESTEROLEMIA 05/13/2009  . Hyperlipidemia 05/13/2009  . 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  . Essential hypertension 05/13/2009  . INGUINAL HERNIA 05/13/2009  . HIATAL HERNIA 05/13/2009  . NEPHROLITHIASIS 05/13/2009  . ROSACEA 05/13/2009    Past Surgical History:  Procedure Laterality Date  . ABDOMINAL HERNIA REPAIR  07/15/12   Dr Ashley Jacobseyes  . ANTERIOR CERVICAL DECOMP/DISCECTOMY FUSION N/A 07/09/2020   Procedure: ANTERIOR CERVICAL DECOMPRESSION AND FUSION CERVICAL THREE-FOUR.;  Surgeon: Donalee Citrinram, Gary, MD;  [facility];  Service: Neurosurgery;  Laterality: N/A;  anterior  . BROW LIFT  05/07/01  . CYSTOSCOPY WITH URETEROSCOPY AND STENT PLACEMENT Right 01/28/2014   Procedure: CYSTOSCOPY WITH URETEROSCOPY, BASKET RETRIVAL AND  STENT PLACEMENT;  Surgeon: Valetta Fulleravid S Grapey, MD;  Location: [facility] ORS;  Service: Urology;  Laterality: Right;  . epidural injections     multiple procedures  . ESOPHAGEAL DILATION  1993 and 1994   multiple times  . EYE SURGERY Bilateral 03/25/10, 2012   cataract removal  . HEMORRHOID SURGERY  2014  . HIATAL HERNIA REPAIR  12/21/93  . HOLMIUM LASER APPLICATION Right 01/28/2014   Procedure: HOLMIUM LASER APPLICATION;  Surgeon: Valetta Fulleravid S Grapey, MD;  Location: [facility] ORS;  Service: Urology;  Laterality: Right;  . INGUINAL HERNIA REPAIR Right 07/15/12   Dr Ashley Jacobseyes, x2  . JOINT REPLACEMENT  07/25/04   right knee  . JOINT REPLACEMENT  07/13/10   left knee  . KNEE SURGERY  1995  . LAPAROSCOPIC CHOLECYSTECTOMY  09/20/06   with intraoperative cholangiogram and right inguinal herniorrhaphy with mesh   . LIGAMENT REPAIR Left 07/2013   shoulder  . LITHOTRIPSY     x2  . NASAL SEPTUM SURGERY  1975  . REFRACTIVE SURGERY Bilateral   . 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 SKIN CANCER DESTRUCTION     nose, ear  . TONSILLECTOMY  as child  . TOTAL HIP ARTHROPLASTY Left   . URETHRAL DILATION  1991   Dr. Annabell HowellsWrenn       Family History  Problem Relation Age of Onset  .  Cancer Mother   . Hypertension Other   . Cancer Other   . Osteoarthritis Other     Social History   Tobacco Use  . Smoking status: Former Smoker    Years: 11.00    Types: Cigarettes    Quit date: 09/11/1978    Years since quitting: 42.0  . Smokeless tobacco: Never Used  Vaping Use  . Vaping Use: Never used  Substance Use Topics  . Alcohol use: Yes    Alcohol/week: 0.0 standard drinks    Comment: rare  . Drug use: No    Home Medications Prior to Admission medications   Medication Sig Start Date End Date Taking? Authorizing Provider  acetaminophen (TYLENOL) 650 MG CR tablet Take 1,300 mg by mouth every 8 (eight) hours as needed for pain.    [provider]  albuterol (VENTOLIN  HFA) 108 (90 Base) MCG/ACT inhaler Inhale 2 puffs into the lungs every 6 (six) hours as needed for shortness of breath. 06/15/20   [name], NP  allopurinol (ZYLOPRIM) 100 MG tablet TAKE 1 TABLET TWICE DAILY Patient taking differently: Take 100 mg by mouth 2 (two) times daily. 04/09/20   [name], NP  amLODipine (NORVASC) 5 MG tablet Take 1 tablet (5 mg total) by mouth daily. 08/23/20   [name], NP  Artificial Tear Solution (SOOTHE XP OP) Place 1 drop into both eyes daily.    [provider]  aspirin EC 81 MG tablet Take 81 mg by mouth every morning.    [provider]  cholecalciferol (VITAMIN D3) 25 MCG (1000 UNIT) tablet Take 1,000 Units by mouth daily.    [provider]  Cranberry 500 MG TABS Take 500 mg by mouth daily.     [provider]  cycloSPORINE (RESTASIS) 0.05 % ophthalmic emulsion Place 1 drop into both eyes 2 (two) times daily.    [provider]  diclofenac sodium (VOLTAREN) 1 % GEL Apply 1 application topically daily as needed (arthritis). 09/15/16   [name], NP  docusate sodium (COLACE) 100 MG capsule Take 1 capsule (100 mg total) by mouth 2 (two) times daily as needed for mild  constipation. Patient taking differently: Take 100 mg by mouth daily. 03/04/19   [name], NP  ferrous sulfate 325 (65 FE) MG tablet Take 325 mg by mouth daily with breakfast.    [provider]  gabapentin (NEURONTIN) 300 MG capsule TAKE 1 CAPSULE TWO TO THREE TIMES DAILY AS NEEDED Patient taking differently: Take 300 mg by mouth 2 (two) times daily as needed (pain). 04/20/20   [name], NP  Homeopathic Products [facility] RELIEF) FOAM Apply 1 application topically at bedtime.    [provider]  levothyroxine (SYNTHROID) 150 MCG tablet TAKE 1 TABLET ONE TIME DAILY ON 6 DAYS PER WEEK AND 1/2 TABLET ON 1 DAY PER WEEK Patient taking differently: Take 75-150 mcg by mouth See admin instructions. Take 75 mcg on Sundays, take 150 mcg daily Mon - Sat 04/29/20   [name], NP  losartan (COZAAR) 100 MG tablet Take 1 tablet (100 mg total) by mouth daily. 08/23/20   [name], NP  Menthol, Topical Analgesic, (ZIMS MAX-FREEZE EX) Apply 1 application topically daily as needed (pain).    [provider]  multivitamin [facility]) per tablet Take 1 tablet by mouth daily.    [provider]  MYRBETRIQ 25 MG TB24 tablet Take 1 tablet (25 mg total) by mouth daily. 06/15/20   [name], NP  nitroGLYCERIN (NITROSTAT) 0.4 MG SL tablet Place 1 tablet (0.4 mg total) under the tongue every 5 (five) minutes as needed for chest pain. 03/11/18   Wendall Stade, MD  Omega-3 Fatty Acids (FISH OIL) 1200 MG CAPS Take 1,200 mg by mouth daily.    [provider]  rosuvastatin (CRESTOR) 5 MG tablet Take 1 tablet by mouth once daily Patient taking differently: Take 5 mg by mouth daily. 04/22/20   [name], NP  sodium chloride (OCEAN) 0.65 % nasal spray Place 1 spray into both nostrils at bedtime as needed for congestion.    [provider]    Allergies    Elastic bandages & [zinc], Pravastatin, Sildenafil, and  Metoclopramide hcl  Review of Systems   Review of Systems   A complete review of systems was obtained and all systems are negative except as  noted in the HPI and PMH.    Physical Exam Updated Vital Signs BP (!) 179/86 (BP Location: Right Arm)   Pulse 71   Temp 98.5 F (36.9 C)   Resp 20   Ht 5' 6.5" (1.689 m)   Wt 67.1 kg   SpO2 100%   BMI 23.53 kg/m   Physical Exam Vitals and nursing note reviewed.  Constitutional:      Appearance: He is well-developed and well-nourished.  HENT:     Head: Normocephalic and atraumatic.     Mouth/Throat:     Mouth: Oropharynx is clear and moist.      Comments: No abrasions or contusions.   No hemotympanum, battle signs or raccoon's eyes  No crepitance or tenderness to palpation along the orbital rim.  EOMI intact with no pain or diplopia  No abnormal otorrhea or rhinorrhea. Nasal septum midline.  No intraoral trauma.Eyes:     Extraocular Movements: EOM normal.     Conjunctiva/sclera: Conjunctivae normal.     Pupils: Pupils are equal, round, and reactive to light.  Neck:     Comments: No midline C-spine  tenderness to palpation or step-offs appreciated. Patient has full range of motion without pain.  Grip/bicep/tricep strength 5/5 bilaterally. Able to differentiate between pinprick and light touch bilaterally    Cardiovascular:     Rate and Rhythm: Normal rate and regular rhythm.     Pulses: Intact distal pulses.  Pulmonary:     Effort: Pulmonary effort is normal. No respiratory distress.     Breath sounds: Normal breath sounds. No wheezing or rales.     Comments: No seatbelt sign, TTP or crepitance Chest:     Chest wall: No tenderness.  Abdominal:     General: Bowel sounds are normal. There is no distension.     Palpations: Abdomen is soft. There is no mass.     Tenderness: There is no abdominal tenderness. There is no guarding or rebound.     Comments: No Seatbelt Sign  Musculoskeletal:        General: No tenderness or  edema. Normal range of motion.     Cervical back: Normal range of motion and neck supple.     Comments: Pelvis stable, No TTP of greater trochanter bilaterally  No tenderness to percussion of Lumbar/Thoracic spinous processes. No step-offs. No paraspinal muscular TTP  Skin:    General: Skin is warm.  Neurological:     Mental Status: He is alert and oriented to person, place, and time.     Comments: Strength 5/5 x4 extremities   Distal sensation intact  Psychiatric:        Mood and Affect: Mood and affect normal.     ED Results / Procedures / Treatments   Labs (all labs ordered are listed, but only abnormal results are displayed) Labs Reviewed - No data to display  EKG None  Radiology CT Head Wo Contrast  Result Date: 09/08/2020 CLINICAL DATA:  Larey Seat, head injury, neck pain EXAM: CT HEAD WITHOUT CONTRAST TECHNIQUE: Contiguous axial images were obtained from the base of the skull through the vertex without intravenous contrast. COMPARISON:  06/05/2020 FINDINGS: Brain: Stable scattered hypodensities within the periventricular white matter and bilateral basal ganglia compatible with chronic small vessel ischemic change. No acute infarct or hemorrhage. Lateral ventricles and midline structures are unremarkable. No acute extra-axial fluid collections. No mass effect. Vascular: No hyperdense vessel or unexpected calcification. Skull: Minimal left parietal scalp hematoma at the convexity. Negative for fracture or  focal lesion. Sinuses/Orbits: No acute finding. Other: None. IMPRESSION: 1. Minimal left parietal scalp hematoma. 2. No acute intracranial process. 3. Stable chronic small vessel ischemic changes. Electronically Signed   By: Sharlet Salina M.D.   On: 09/08/2020 18:59   CT Cervical Spine Wo Contrast  Result Date: 09/08/2020 CLINICAL DATA:  Larey Seat this morning, neck pain, previous cervical spine surgery EXAM: CT CERVICAL SPINE WITHOUT CONTRAST TECHNIQUE: Multidetector CT imaging of the  cervical spine was performed without intravenous contrast. Multiplanar CT image reconstructions were also generated. COMPARISON:  08/12/2020 FINDINGS: Alignment: Stable retrolisthesis of C3 relative to C4, with no change in the ACDF hardware at the C3/C4 disc space. Otherwise alignment is anatomic. Skull base and vertebrae: No acute fracture. No primary bone lesion or focal pathologic process. Soft tissues and spinal canal: No prevertebral fluid or swelling. No visible canal hematoma. Prominent atherosclerosis of the carotid vessels bilaterally. Disc levels: Stable facet hypertrophy from C2 through C6. Moderate spondylosis at C4-5, C5-6, C6-7, and C7-T1. There is mild symmetrical neural foraminal narrowing at C3-4, primarily due to retrolisthesis of C3 relative to C4. There is symmetrical neural foraminal encroachment at C4-5 and C5-6 from bilateral disc osteophyte complex. Upper chest: Airway is patent.  Lung apices are clear. Other: Reconstructed images demonstrate no additional findings. IMPRESSION: 1. No acute cervical spine fracture. 2. Stable C3/C4 ACDF, with persistent mild retrolisthesis of C3 relative to C4. 3. Multilevel spondylosis and facet hypertrophy, most pronounced at C3-4, C4-5, and C5-6 with symmetrical neural foraminal encroachment at those levels. Electronically Signed   By: Sharlet Salina M.D.   On: 09/08/2020 19:02    Procedures Procedures (including critical care time)  Medications Ordered in ED Medications - No data to display  ED Course  I have reviewed the triage vital signs and the nursing notes.  Pertinent labs & imaging results that were available during my care of the patient were reviewed by me and considered in my medical decision making (see chart for details).    MDM Rules/Calculators/A&P                          Vitals:   09/08/20 1823 09/08/20 1827 09/08/20 1935  BP:  (!) 157/85 (!) 179/86  Pulse:  75 71  Resp:  16 20  Temp:  98.5 F (36.9 C)   SpO2:  97%  100%  Weight: 67.1 kg    Height: 5' 6.5" (1.689 m)      [name] Pratt is 79 y.o. male presenting with mild headache, mild lateral bilateral neck pain and some low back and bilateral hip soreness status post fall from standing onto concrete driveway today.  Reassuring neurologic exam.  CT imaging of head and neck are without acute findings.  Physical exam reassuring, counseled him on concussion precautions.  This is a shared visit with the attending physician who personally evaluated the patient and agrees with the care plan.   Evaluation does not show pathology that would require ongoing emergent intervention or inpatient treatment. Pt is hemodynamically stable and mentating appropriately. Discussed findings and plan with patient/guardian, who agrees with care plan. All questions answered. Return precautions discussed and outpatient follow up given.     Final Clinical Impression(s) / ED Diagnoses Final diagnoses:  Concussion without loss of consciousness, initial encounter  Musculoskeletal pain  Fall from standing, initial encounter    Rx / DC Orders ED Discharge Orders    None  Waynetta Pean 09/08/20 2002    Ezequiel Essex, MD 09/08/20 2256

## 2020-09-09 ENCOUNTER — Encounter: Payer: Medicare HMO | Admitting: Physical Therapy

## 2020-09-09 DIAGNOSIS — I10 Essential (primary) hypertension: Secondary | ICD-10-CM | POA: Diagnosis not present

## 2020-09-09 DIAGNOSIS — G959 Disease of spinal cord, unspecified: Secondary | ICD-10-CM | POA: Diagnosis not present

## 2020-09-11 ENCOUNTER — Other Ambulatory Visit: Payer: Self-pay | Admitting: Family

## 2020-09-13 ENCOUNTER — Other Ambulatory Visit: Payer: Self-pay | Admitting: Family

## 2020-09-14 DIAGNOSIS — R3915 Urgency of urination: Secondary | ICD-10-CM | POA: Diagnosis not present

## 2020-09-14 DIAGNOSIS — N3941 Urge incontinence: Secondary | ICD-10-CM | POA: Diagnosis not present

## 2020-09-15 ENCOUNTER — Ambulatory Visit (INDEPENDENT_AMBULATORY_CARE_PROVIDER_SITE_OTHER): Payer: Medicare HMO | Admitting: Rehabilitative and Restorative Service Providers"

## 2020-09-15 ENCOUNTER — Ambulatory Visit: Payer: Medicare HMO | Admitting: Family

## 2020-09-15 DIAGNOSIS — R2689 Other abnormalities of gait and mobility: Secondary | ICD-10-CM

## 2020-09-15 DIAGNOSIS — R2681 Unsteadiness on feet: Secondary | ICD-10-CM

## 2020-09-15 DIAGNOSIS — M6281 Muscle weakness (generalized): Secondary | ICD-10-CM | POA: Diagnosis not present

## 2020-09-15 DIAGNOSIS — R29818 Other symptoms and signs involving the nervous system: Secondary | ICD-10-CM | POA: Diagnosis not present

## 2020-09-15 NOTE — Patient Instructions (Signed)
Access Code: [email or web address] URL: [email or web address] Date: 09/15/2020 Prepared by: Margretta Ditty  Exercises Squat with Chair Touch - 2 x daily - 7 x weekly - 1 sets - 10 reps Side Stepping with Counter Support - 1 x daily - 3 x weekly - 1 sets - 10 reps Straight Leg Raise - 2 x daily - 7 x weekly - 1 sets - 5-10 reps Supine Butterfly Groin Stretch - 2 x daily - 7 x weekly - 1 sets - 3 reps - 30 seconds hold Beginner Bridge - 1 x daily - 7 x weekly - 2 sets - 5 reps Supine Piriformis Stretch with Foot on Ground - 1 x daily - 7 x weekly - 1 sets - 2 reps - 30 sec hold Sidelying Hip Abduction - 2 x daily - 7 x weekly - 2 sets - 5 reps

## 2020-09-15 NOTE — Therapy (Signed)
[place], [place], [address] Phone: [phone]   Fax:  [phone]  Physical Therapy Treatment and Renewal Summary  Patient Details  Name: [name] Pratt MRN: [id number] Date of Birth: 08-Apr-1941 Referring Provider (PT): [name], MD   Encounter Date: 09/15/2020   PT End of Session - 09/15/20 1045    Visit Number 8    Number of Visits 12    Date for PT Re-Evaluation 10/27/20    Authorization Type [name]    Authorization - Visit Number 8    Authorization - Number of Visits 12    Progress Note Due on Visit 10    PT Start Time 1019    PT Stop Time 1100    PT Time Calculation (min) 41 min    Equipment Utilized During Treatment Gait belt    Activity Tolerance Patient tolerated treatment well    Behavior During Therapy WFL for tasks assessed/performed           Past Medical History:  Diagnosis Date  . Aortic atherosclerosis ([place]) 03/05/2020  . Arthritis   . Cancer ([place])    skin cancer  . Carotid stenosis    a. Carotid U/S 9/79: LICA < 48%, RICA 01-65%;  b.  Carotid U/S 5/37:  RICA 48-27%; LICA 0-78%; f/u 1 year  . Chest pain     Myoview in 2008 was normal.  Echo 7/09: EF 60%, normal wall motion, mild LVH, mild LAE.  [name] Complication of anesthesia    "stomach does not wake up"  . COPD (chronic obstructive pulmonary disease) ([place])   . Emphysema, unspecified ([place]) 03/05/2020  . GERD (gastroesophageal reflux disease)   . Gout   . History of chicken pox   . History of hemorrhoids   . History of kidney stones   . Hyperlipidemia    under control  . Hypertension    under control  . Hypothyroidism   . Ileus ([place])   . Inguinal hernia   . OSA (obstructive sleep apnea)   . Pancreatitis   . PONV (postoperative nausea and vomiting)   . Rosacea   . 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    Past Surgical History:  Procedure Laterality  Date  . ABDOMINAL HERNIA REPAIR  07/15/12   Dr [name]  . ANTERIOR CERVICAL DECOMP/DISCECTOMY FUSION N/A 07/09/2020   Procedure: ANTERIOR CERVICAL DECOMPRESSION AND FUSION CERVICAL THREE-FOUR.;  Surgeon: [name], MD;  Location: [facility];  Service: Neurosurgery;  Laterality: N/A;  anterior  . BROW LIFT  05/07/01  . CYSTOSCOPY WITH URETEROSCOPY AND STENT PLACEMENT Right 01/28/2014   Procedure: CYSTOSCOPY WITH URETEROSCOPY, BASKET RETRIVAL AND  STENT PLACEMENT;  Surgeon: [name], MD;  Location: [facility];  Service: Urology;  Laterality: Right;  . epidural injections     multiple procedures  . [place]   multiple times  . EYE SURGERY Bilateral 03/25/10, 2012   cataract removal  . HEMORRHOID SURGERY  2014  . HIATAL HERNIA REPAIR  12/21/93  . HOLMIUM LASER APPLICATION Right 6/75/4492   Procedure: HOLMIUM LASER APPLICATION;  Surgeon: [name], MD;  [facility];  Service: Urology;  Laterality: Right;  . INGUINAL HERNIA REPAIR Right 07/15/12   Dr [name], x2  . JOINT REPLACEMENT  07/25/04   right knee  . JOINT REPLACEMENT  07/13/10   left knee  . KNEE SURGERY  1995  .  LAPAROSCOPIC CHOLECYSTECTOMY  09/20/06   with intraoperative cholangiogram and right inguinal herniorrhaphy with mesh   . LIGAMENT REPAIR Left 07/2013   shoulder  . LITHOTRIPSY     x2  . NASAL SEPTUM SURGERY  1975  . REFRACTIVE SURGERY Bilateral   . 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 SKIN CANCER DESTRUCTION     nose, ear  . TONSILLECTOMY  as child  . TOTAL HIP ARTHROPLASTY Left   . URETHRAL DILATION  1991   [name]    There were no vitals filed for this visit.   Subjective Assessment - 09/15/20 1022    Subjective The patient had a fall in his driveway and had a headache.  He went to the ED on 09/08/20 for assessment.  With recent weather change, he got increased neck and low back pain.    He has been doing some work around the house and had to wheel a heavy trash can to the curb.  He  has pain in his SI region.    Pertinent History neuropathy, HTN, lumbar DDD, hypothyroidism, carotid stenosis    Patient Stated Goals "I want my leg to get better and walk without limping."    Currently in Pain? Yes    Pain Score 7     Pain Location Back    Pain Orientation Lower    Pain Descriptors / Indicators Aching;Sore;Discomfort    Pain Type Acute pain    Pain Onset More than a month ago    Pain Frequency Intermittent    Aggravating Factors  pain in SI region is acute in nature; he has chronic h/o LBP              [facility] PT Assessment - 09/15/20 1028      Assessment   Medical Diagnosis s/p cervical myelopathy, gait training focus only    Referring Provider (PT) [name], MD    Onset Date/Surgical Date 07/09/20    Hand Dominance Right      Strength   Right Hip ABduction 3/5    Left Hip ABduction 4/5      Ambulation/Gait   Ambulation/Gait Assistance 6: Modified independent (Device/Increase time)    Ambulation Distance (Feet) 250 Feet    Assistive device None    Ambulation Surface Level;Indoor    Gait velocity 2.18 ft/sec    Gait Comments Improved knee control noted as compared to evaluation.                         [facility] Adult PT Treatment/Exercise - 09/15/20 1028      Ambulation/Gait   Ambulation/Gait Yes      Neuro Re-ed    Neuro Re-ed Details  single limb stance activities      Exercises   Exercises Knee/Hip;Lumbar      Lumbar Exercises: Stretches   Active Hamstring Stretch Right;Left;30 seconds;2 reps    Single Knee to Chest Stretch Right;Left;1 rep;60 seconds    Hip Flexor Stretch Right;Left;1 rep;30 seconds    Prone on Elbows Stretch 1 rep;60 seconds    Press Ups 5 reps    Press Ups Limitations no pain    Piriformis Stretch Right;Left;2 reps;30 seconds      Lumbar Exercises: Aerobic   Nustep L4 x 4.5 minutes :LEs only      Lumbar Exercises: Sidelying   Clam Right;5 reps    Hip Abduction Right;10 reps    Hip Abduction Weights  (lbs) with cues  Manual Therapy   Manual Therapy Joint mobilization    Manual therapy comments to reduce pain    Joint Mobilization gentle sacral compression PA grade II                  PT Education - 09/15/20 1058    Education Details HEP    Person(s) Educated Patient    Methods Explanation;Demonstration;Handout    Comprehension Verbalized understanding;Returned demonstration               PT Long Term Goals - 09/15/20 1031      PT LONG TERM GOAL #1   Title The patient will be indep with HEP.    Time 6    Period Weeks    Status Partially Met      PT LONG TERM GOAL #2   Title The patient will improve R hip abduction to 4/5.    Baseline R 3/5, L 4/5 for hip abduction    Time 6    Period Weeks    Status Not Met      PT LONG TERM GOAL #3   Title The patient will improve gait speed from 2.02 ft/sec to > or equal to 2.62 ft/sec to demo improved community moiblity.    Baseline 2.03 ft/sec    Time 6    Period Weeks    Status Not Met      PT LONG TERM GOAL #4   Title The patient will maintain single leg stance x 5 seconds bilat.    Baseline L leg 5 seconds, R leg 3 seconds    Time 6    Period Weeks    Status Partially Met      PT LONG TERM GOAL #5   Title The patient will return demo floor <> stand with one UE support on surface to rise.    Time 6    Period Weeks    Status Achieved      PT LONG TERM GOAL #6   Title The patient will demonstrate improved knee control demonstrating recurvatum < 25% of gait x 200 ft.    Baseline Patient has made significant progress with gait mechanics of shortened stride, leading to improved knee control/recurvatum    Time 6    Period Weeks    Status Achieved           UPDATED LONG TERM GOALS:   PT Long Term Goals - 09/15/20 1031      PT LONG TERM GOAL #1   Title The patient will be indep with HEP.    Time 6    Period Weeks    Status Revised    Target Date 10/27/20      PT LONG TERM GOAL #2   Title The  patient will improve R hip abduction to 4/5.    Baseline R 3/5, L 4/5 for hip abduction    Time 6    Period Weeks    Status Revised    Target Date 10/27/20      PT LONG TERM GOAL #3   Title The patient will improve gait speed from 2.02 ft/sec to > or equal to 2.62 ft/sec to demo improved community moiblity.    Baseline --    Time 6    Period Weeks    Status Revised    Target Date 10/27/20      PT LONG TERM GOAL #4   Title The patient will maintain single leg stance x 5 seconds bilat.  Baseline L leg 5 seconds, R leg 3 seconds    Time 6    Period Weeks    Status Revised    Target Date 10/27/20      PT LONG TERM GOAL #5   Title The patient will report dec'd low back pain to 2/10.    Time 6    Period Weeks    Status New    Target Date 10/27/20      PT LONG TERM GOAL #6   Title --    Baseline --    Time --    Period --    Status --                Plan - 09/15/20 1046    Clinical Impression Statement The patient has partially met LTGs.  R hip abductors appear weaker today with 3/5 strength.  PT updated LTGs and plans to continue working x 6 more weeks in PT, if indicated.    Rehab Potential --    PT Frequency 2x / week    PT Duration 6 weeks    PT Treatment/Interventions ADLs/Self Care Home Management;Gait training;Stair training;Functional mobility training;Therapeutic activities;Therapeutic exercise;Balance training;Neuromuscular re-education;Manual techniques;Patient/family education;Taping;Electrical Stimulation;Orthotic Fit/Training    PT Next Visit Plan R hip strengthening, bilateral LE strength, low back stretching/core stability, standing balance.    PT Home Exercise Plan Access Code: [email or web address]    Consulted and Agree with Plan of Care Patient           Patient will benefit from skilled therapeutic intervention in order to improve the following deficits and impairments:     Visit Diagnosis: Muscle weakness (generalized)  Other abnormalities of gait  and mobility  Other symptoms and signs involving the nervous system  Unsteadiness on feet     Problem List Patient Active Problem List   Diagnosis Date Noted  . Cystoid macular edema of both eyes 07/28/2020  . Status post cervical spinal fusion 07/09/2020  . History of total bilateral knee replacement 07/08/2020  . Aortic atherosclerosis ([place]) 03/05/2020  . Emphysema, unspecified ([place]) 03/05/2020  . SIRS (systemic inflammatory response syndrome) ([place]) 03/04/2020  . Heme positive stool 02/29/2020  . Leg cramps 11/13/2019  . Tibialis anterior tendon tear, nontraumatic 11/13/2019  . 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 CPAP (continuous positive airway pressure) dependence 03/25/2018  . 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 Acute idiopathic gout of hand 03/25/2018  . Erectile dysfunction 06/20/2017  . 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 Peripheral neuropathy 06/19/2016  . Hearing loss 02/02/2016  . 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 Allergic rhinitis 02/02/2016  . Atypical chest pain 11/24/2015  . Preventative health care 11/24/2015  . Onychomycosis 06/30/2015  . Degenerative disc disease, lumbar 06/30/2015  . Other allergic rhinitis 02/18/2015  . Deviated nasal septum 02/18/2015  . OSA on CPAP 02/18/2015  . RLS (restless legs syndrome) 02/18/2015  . GERD (gastroesophageal reflux disease) 09/18/2014  . Hyperglycemia 03/03/2014  . Sleep apnea with use of continuous positive airway pressure (CPAP) 07/15/2013  . Carotid stenosis 02/24/2013  . Nonspecific abnormal electrocardiogram (ECG) (EKG) 01/06/2013  . DJD (degenerative joint disease) of hip 12/24/2012  . Lower GI bleed 12/12/2012  . Dyspnea 06/12/2011  . Hypothyroidism 05/13/2009  . HYPERCHOLESTEROLEMIA 05/13/2009  . Hyperlipidemia 05/13/2009  . 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  . Essential hypertension 05/13/2009  . INGUINAL HERNIA 05/13/2009  . HIATAL HERNIA  05/13/2009  . NEPHROLITHIASIS 05/13/2009  . ROSACEA  05/13/2009    [place], [place] 09/15/2020, 12:25 PM  [facility] [place], [place], [address] Phone: [phone]   Fax:  [phone]  Name: RICO MASSAR MRN: [id number] Date of Birth: 1941-01-23

## 2020-09-21 ENCOUNTER — Encounter: Payer: Medicare HMO | Admitting: Physical Therapy

## 2020-09-21 DIAGNOSIS — N3941 Urge incontinence: Secondary | ICD-10-CM | POA: Diagnosis not present

## 2020-09-21 DIAGNOSIS — R3915 Urgency of urination: Secondary | ICD-10-CM | POA: Diagnosis not present

## 2020-09-22 ENCOUNTER — Other Ambulatory Visit: Payer: Self-pay

## 2020-09-22 ENCOUNTER — Ambulatory Visit: Payer: Medicare HMO | Admitting: Physical Therapy

## 2020-09-22 ENCOUNTER — Encounter: Payer: Self-pay | Admitting: Physical Therapy

## 2020-09-22 DIAGNOSIS — M109 Gout, unspecified: Secondary | ICD-10-CM | POA: Diagnosis not present

## 2020-09-22 DIAGNOSIS — N1832 Chronic kidney disease, stage 3b: Secondary | ICD-10-CM | POA: Diagnosis not present

## 2020-09-22 DIAGNOSIS — E785 Hyperlipidemia, unspecified: Secondary | ICD-10-CM | POA: Diagnosis not present

## 2020-09-22 DIAGNOSIS — I129 Hypertensive chronic kidney disease with stage 1 through stage 4 chronic kidney disease, or unspecified chronic kidney disease: Secondary | ICD-10-CM | POA: Diagnosis not present

## 2020-09-22 DIAGNOSIS — R2689 Other abnormalities of gait and mobility: Secondary | ICD-10-CM | POA: Diagnosis not present

## 2020-09-22 DIAGNOSIS — Z87442 Personal history of urinary calculi: Secondary | ICD-10-CM | POA: Diagnosis not present

## 2020-09-22 DIAGNOSIS — M6281 Muscle weakness (generalized): Secondary | ICD-10-CM

## 2020-09-22 DIAGNOSIS — R29818 Other symptoms and signs involving the nervous system: Secondary | ICD-10-CM | POA: Diagnosis not present

## 2020-09-22 DIAGNOSIS — R2681 Unsteadiness on feet: Secondary | ICD-10-CM | POA: Diagnosis not present

## 2020-09-22 DIAGNOSIS — M5412 Radiculopathy, cervical region: Secondary | ICD-10-CM | POA: Diagnosis not present

## 2020-09-22 NOTE — Therapy (Signed)
[facility] [place] [address] [place], [place], [address] Phone: [phone]   Fax:  [phone]  Physical Therapy Treatment  Patient Details  Name: [name] Pratt MRN: [id number] Date of Birth: [birth year] Referring Provider (PT): Donalee Citrin, MD   Encounter Date: 09/22/2020   PT End of Session - 09/22/20 1427    Visit Number 9    Number of Visits 12    Date for PT Re-Evaluation 10/27/20    Authorization Type [name]    Authorization - Visit Number 9    Authorization - Number of Visits 12    Progress Note Due on Visit 10    PT Start Time 1428    PT Stop Time 1515    PT Time Calculation (min) 47 min    Equipment Utilized During Treatment Gait belt    Activity Tolerance Patient tolerated treatment well    Behavior During Therapy WFL for tasks assessed/performed           Past Medical History:  Diagnosis Date  . Aortic atherosclerosis ([facility]) 03/05/2020  . Arthritis   . Cancer ([facility])    skin cancer  . Carotid stenosis    a. Carotid U/S 5/13: LICA < 50%, RICA 50-69%;  b.  Carotid U/S 5/14:  RICA 40-59%; LICA 0-39%; f/u 1 year  . Chest pain     Myoview in 2008 was normal.  Echo 7/09: EF 60%, normal wall motion, mild LVH, mild LAE.  [name] Complication of anesthesia    "stomach does not wake up"  . COPD (chronic obstructive pulmonary disease) ([facility])   . Emphysema, unspecified ([facility]) 03/05/2020  . GERD (gastroesophageal reflux disease)   . Gout   . History of chicken pox   . History of hemorrhoids   . History of kidney stones   . Hyperlipidemia    under control  . Hypertension    under control  . Hypothyroidism   . Ileus ([facility])   . Inguinal hernia   . OSA (obstructive sleep apnea)   . Pancreatitis   . PONV (postoperative nausea and vomiting)   . Rosacea   . 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 Fawn Kirk    Past Surgical History:  Procedure Laterality Date  . ABDOMINAL  HERNIA REPAIR  07/15/12   Dr Ashley Jacobs  . ANTERIOR CERVICAL DECOMP/DISCECTOMY FUSION N/A 07/09/2020   Procedure: ANTERIOR CERVICAL DECOMPRESSION AND FUSION CERVICAL THREE-FOUR.;  Surgeon: Donalee Citrin, MD;  Location: [facility];  Service: Neurosurgery;  Laterality: N/A;  anterior  . BROW LIFT  05/07/01  . CYSTOSCOPY WITH URETEROSCOPY AND STENT PLACEMENT Right 01/28/2014   Procedure: CYSTOSCOPY WITH URETEROSCOPY, BASKET RETRIVAL AND  STENT PLACEMENT;  Surgeon: Valetta Fuller, MD;  [facility];  Service: Urology;  Laterality: Right;  . epidural injections     multiple procedures  . ESOPHAGEAL DILATION  1993 and 1994   multiple times  . EYE SURGERY Bilateral 03/25/10, 2012   cataract removal  . HEMORRHOID SURGERY  2014  . HIATAL HERNIA REPAIR  12/21/93  . HOLMIUM LASER APPLICATION Right 01/28/2014   Procedure: HOLMIUM LASER APPLICATION;  Surgeon: Valetta Fuller, MD;  Location: [facility];  Service: Urology;  Laterality: Right;  . INGUINAL HERNIA REPAIR Right 07/15/12   Dr Ashley Jacobs, x2  . JOINT REPLACEMENT  07/25/04   right knee  . JOINT REPLACEMENT  07/13/10   left knee  . KNEE SURGERY  1995  . LAPAROSCOPIC  CHOLECYSTECTOMY  09/20/06   with intraoperative cholangiogram and right inguinal herniorrhaphy with mesh   . LIGAMENT REPAIR Left 07/2013   shoulder  . LITHOTRIPSY     x2  . NASAL SEPTUM SURGERY  1975  . REFRACTIVE SURGERY Bilateral   . 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 SKIN CANCER DESTRUCTION     nose, ear  . TONSILLECTOMY  as child  . TOTAL HIP ARTHROPLASTY Left   . URETHRAL DILATION  1991   [name]    There were no vitals filed for this visit.   Subjective Assessment - 09/22/20 1436    Subjective Pt put maxi-freeze on Rt hip and low back today and this has helped eliminate the pain.  He complains that his Rt knee has started to buckle again.    Patient Stated Goals "I want my leg to get better and walk without limping."    Currently in Pain? No/denies    Pain Score 0-No pain     Aggravating Factors  prolonged sitting    Pain Relieving Factors getting up and moving.              [facility] PT Assessment - 09/22/20 0001      Assessment   Medical Diagnosis s/p cervical myelopathy, gait training focus only    Referring Provider (PT) [name], MD    Onset Date/Surgical Date 07/09/20    Hand Dominance Right    Next MD Visit 09/30/20            [facility] Adult PT Treatment/Exercise - 09/22/20 0001      Ambulation/Gait   Ambulation Distance (Feet) 100 Feet    Assistive device None    Ambulation Surface Level;Indoor    Gait Comments cues for increased Rt hip flexion and shortened Rt step length. Improved quality with cues.      Lumbar Exercises: Aerobic   Nustep L4 x 4.5 minutes :LEs only      Lumbar Exercises: Supine   Bridge 10 reps;3 seconds      Lumbar Exercises: Sidelying   Clam Right;10 reps   therapist stablizing pelvis   Hip Abduction Right;10 reps   therapist stablizing pelvis   Hip Abduction Limitations pt rolls backwards requiring assistance      Lumbar Exercises: Prone   Straight Leg Raise 5 reps    Straight Leg Raises Limitations increases LB discomfort.  head in neutral position    Other Prone Lumbar Exercises prone on elbows x 20 sec      Knee/Hip Exercises: Stretches   Piriformis Stretch Left;Right;2 reps;20 seconds      Knee/Hip Exercises: Standing   SLS RLE x 3 reps, 10 sec x 2, 15 sec x 1      Knee/Hip Exercises: Supine   Straight Leg Raises Strengthening;Right;1 set;10 reps    Straight Leg Raises Limitations cues to keep knee straight, and flex foot               Balance Exercises - 09/22/20 0001      Balance Exercises: Standing   SLS Eyes open;Intermittent upper extremity support;2 reps;10 secs;15 secs    Rockerboard Anterior/posterior;Lateral;EO;30 seconds;Intermittent UE support    Retro Gait 3 reps   intermittent UE support   Sidestepping 3 reps   cues to slow pace                 PT Long Term Goals -  09/22/20 1532      PT LONG TERM GOAL #  1   Title The patient will be indep with HEP.    Time 6    Period Weeks    Status On-going      PT LONG TERM GOAL #2   Title The patient will improve R hip abduction to 4/5.    Baseline R 3/5, L 4/5 for hip abduction    Time 6    Period Weeks    Status On-going      PT LONG TERM GOAL #3   Title The patient will improve gait speed from 2.02 ft/sec to > or equal to 2.62 ft/sec to demo improved community moiblity.    Time 6    Period Weeks    Status On-going      PT LONG TERM GOAL #4   Title The patient will maintain single leg stance x 5 seconds bilat.    Baseline L leg 5 seconds, R leg 3 seconds    Time 6    Period Weeks    Status On-going      PT LONG TERM GOAL #5   Title The patient will report dec'd low back pain to 2/10.    Time 6    Period Weeks    Status On-going                 Plan - 09/22/20 1533    Clinical Impression Statement Pt reporting less back pain upon arrival, however he reported mild increase in LBP with sidelying hip abdct and prone hip ext; reduced with rest. Continued weakness in Rt hip abdct and ER.  Improved knee control with standing balance exercises.  Progressing towards goals.    PT Frequency 2x / week    PT Duration 6 weeks    PT Treatment/Interventions ADLs/Self Care Home Management;Gait training;Stair training;Functional mobility training;Therapeutic activities;Therapeutic exercise;Balance training;Neuromuscular re-education;Manual techniques;Patient/family education;Taping;Electrical Stimulation;Orthotic Fit/Training    PT Next Visit Plan 10th visit note. R hip strengthening, bilateral LE strength, low back stretching/core stability, standing balance.    PT Home Exercise Plan Access Code: [email or web address]    Consulted and Agree with Plan of Care Patient           Patient will benefit from skilled therapeutic intervention in order to improve the following deficits and impairments:  Abnormal  gait,Impaired flexibility,Decreased strength,Difficulty walking,Decreased activity tolerance,Decreased balance,Impaired sensation  Visit Diagnosis: Muscle weakness (generalized)  Other abnormalities of gait and mobility  Other symptoms and signs involving the nervous system  Unsteadiness on feet     Problem List Patient Active Problem List   Diagnosis Date Noted  . Cystoid macular edema of both eyes 07/28/2020  . Status post cervical spinal fusion 07/09/2020  . History of total bilateral knee replacement 07/08/2020  . Aortic atherosclerosis ([facility]) 03/05/2020  . Emphysema, unspecified ([facility]) 03/05/2020  . SIRS (systemic inflammatory response syndrome) ([facility]) 03/04/2020  . Heme positive stool 02/29/2020  . Leg cramps 11/13/2019  . Tibialis anterior tendon tear, nontraumatic 11/13/2019  . 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 CPAP (continuous positive airway pressure) dependence 03/25/2018  . 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 Acute idiopathic gout of hand 03/25/2018  . Erectile dysfunction 06/20/2017  . 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 Peripheral neuropathy 06/19/2016  . Hearing loss 02/02/2016  . 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 Allergic rhinitis 02/02/2016  . Atypical chest pain 11/24/2015  . Preventative health care 11/24/2015  . Onychomycosis 06/30/2015  . Degenerative disc disease, lumbar  06/30/2015  . Other allergic rhinitis 02/18/2015  . Deviated nasal septum 02/18/2015  . OSA on CPAP 02/18/2015  . RLS (restless legs syndrome) 02/18/2015  . GERD (gastroesophageal reflux disease) 09/18/2014  . Hyperglycemia 03/03/2014  . Sleep apnea with use of continuous positive airway pressure (CPAP) 07/15/2013  . Carotid stenosis 02/24/2013  . Nonspecific abnormal electrocardiogram (ECG) (EKG) 01/06/2013  . DJD (degenerative joint disease) of hip 12/24/2012  . Lower GI bleed 12/12/2012  . Dyspnea  06/12/2011  . Hypothyroidism 05/13/2009  . HYPERCHOLESTEROLEMIA 05/13/2009  . Hyperlipidemia 05/13/2009  . 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  . Essential hypertension 05/13/2009  . INGUINAL HERNIA 05/13/2009  . HIATAL HERNIA 05/13/2009  . NEPHROLITHIASIS 05/13/2009  . ROSACEA 05/13/2009    Kerin Perna, PTA 09/22/20 3:47 PM  [facility] [place] [place] [address] [place], [place], [address] Phone: [phone]   Fax:  [phone]  Name: [name] Pratt MRN: [id number] Date of Birth: [birth year]

## 2020-09-24 ENCOUNTER — Other Ambulatory Visit: Payer: Self-pay

## 2020-09-24 ENCOUNTER — Ambulatory Visit: Payer: Medicare HMO | Admitting: Physical Therapy

## 2020-09-24 ENCOUNTER — Encounter: Payer: Self-pay | Admitting: Physical Therapy

## 2020-09-24 DIAGNOSIS — R2681 Unsteadiness on feet: Secondary | ICD-10-CM

## 2020-09-24 DIAGNOSIS — R29818 Other symptoms and signs involving the nervous system: Secondary | ICD-10-CM

## 2020-09-24 DIAGNOSIS — M6281 Muscle weakness (generalized): Secondary | ICD-10-CM

## 2020-09-24 DIAGNOSIS — R2689 Other abnormalities of gait and mobility: Secondary | ICD-10-CM | POA: Diagnosis not present

## 2020-09-24 NOTE — Therapy (Signed)
[place], [place], [address] Phone: [phone]   Fax:  [phone]  Physical Therapy Treatment/ 10th visit note  Patient Details  Name: [name] Pratt MRN: [id number] Date of Birth: [birth year] Referring Provider (PT): [name], MD  Physical Therapy Progress Note   Dates of Reporting Period:08/02/20-09/24/20   Objective Measurements: see note below  Reason Skilled Services are Required: patient continuing to progress with gait activities and LE strengthening to improve functional mobility and decrease fall risk  Thank you for the referral of this patient.    Encounter Date: 09/24/2020   PT End of Session - 09/24/20 0952    Visit Number 10    Number of Visits 12    Date for PT Re-Evaluation 10/27/20    Authorization Type [name]    Authorization - Visit Number 10    Authorization - Number of Visits 12    Progress Note Due on Visit 10    PT Start Time 1005    PT Stop Time 1050    PT Time Calculation (min) 45 min    Equipment Utilized During Treatment Gait belt    Activity Tolerance Patient tolerated treatment well    Behavior During Therapy WFL for tasks assessed/performed           Past Medical History:  Diagnosis Date  . Aortic atherosclerosis ([place]) 03/05/2020  . Arthritis   . Cancer ([place])    skin cancer  . Carotid stenosis    a. Carotid U/S 6/30: LICA < 16%, RICA 01-09%;  b.  Carotid U/S 3/23:  RICA 55-73%; LICA 2-20%; f/u 1 year  . Chest pain     Myoview in 2008 was normal.  Echo 7/09: EF 60%, normal wall motion, mild LVH, mild LAE.  [name] Complication of anesthesia    "stomach does not wake up"  . COPD (chronic obstructive pulmonary disease) ([place])   . Emphysema, unspecified ([place]) 03/05/2020  . GERD (gastroesophageal reflux disease)   . Gout   . History of chicken pox   . History of hemorrhoids   . History of kidney stones   . Hyperlipidemia    under control  .  Hypertension    under control  . Hypothyroidism   . Ileus ([place])   . Inguinal hernia   . OSA (obstructive sleep apnea)   . Pancreatitis   . PONV (postoperative nausea and vomiting)   . Rosacea   . 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    Past Surgical History:  Procedure Laterality Date  . ABDOMINAL HERNIA REPAIR  07/15/12   Dr [name]  . ANTERIOR CERVICAL DECOMP/DISCECTOMY FUSION N/A 07/09/2020   Procedure: ANTERIOR CERVICAL DECOMPRESSION AND FUSION CERVICAL THREE-FOUR.;  Surgeon: [name], MD;  Location: [facility];  Service: Neurosurgery;  Laterality: N/A;  anterior  . BROW LIFT  05/07/01  . CYSTOSCOPY WITH URETEROSCOPY AND STENT PLACEMENT Right 01/28/2014   Procedure: CYSTOSCOPY WITH URETEROSCOPY, BASKET RETRIVAL AND  STENT PLACEMENT;  Surgeon: [name], MD;  Location[facility];  Service: Urology;  Laterality: Right;  . epidural injections     multiple procedures  . [place]   multiple times  . EYE SURGERY Bilateral 03/25/10, 2012   cataract removal  . HEMORRHOID SURGERY  2014  . HIATAL HERNIA REPAIR  12/21/93  . HOLMIUM LASER APPLICATION Right 2/54/2706   Procedure: HOLMIUM LASER APPLICATION;  Surgeon:  [name], MD;  [facility];  Service: Urology;  Laterality: Right;  . INGUINAL HERNIA REPAIR Right 07/15/12   Dr [name], x2  . JOINT REPLACEMENT  07/25/04   right knee  . JOINT REPLACEMENT  07/13/10   left knee  . KNEE SURGERY  1995  . LAPAROSCOPIC CHOLECYSTECTOMY  09/20/06   with intraoperative cholangiogram and right inguinal herniorrhaphy with mesh   . LIGAMENT REPAIR Left 07/2013   shoulder  . LITHOTRIPSY     x2  . NASAL SEPTUM SURGERY  1975  . REFRACTIVE SURGERY Bilateral   . 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 SKIN CANCER DESTRUCTION     nose, ear  . TONSILLECTOMY  as child  . TOTAL HIP ARTHROPLASTY Left   . URETHRAL DILATION  1991   [name]    There were no vitals filed for  this visit.   Subjective Assessment - 09/24/20 1009    Subjective Pt reports his low back and Rt hip are sore from sitting for breakfast and driving.  He complains that his Rt leg remains weak, "some days are better than others".    Patient Stated Goals "I want my leg to get better and walk without limping."    Currently in Pain? Yes    Pain Score 3     Pain Location Back    Pain Orientation Lower    Pain Descriptors / Indicators Dull;Aching    Aggravating Factors  prolonged sitting    Pain Relieving Factors getting up and moving              [facility] PT Assessment - 09/24/20 0001      Assessment   Medical Diagnosis s/p cervical myelopathy, gait training focus only    Referring Provider (PT) [name], MD    Onset Date/Surgical Date 07/09/20    Hand Dominance Right    Next MD Visit 09/30/20      Functional Tests   Functional tests Single leg stance      Single Leg Stance   Comments L 10 sec, R 3 sec      Strength   Right Hip ABduction 3/5            [facility] Adult PT Treatment/Exercise - 09/24/20 1129      Ambulation/Gait   Ambulation/Gait Assistance 6: Modified independent (Device/Increase time)    Ambulation Distance (Feet) 100 Feet    Gait Pattern Step-through pattern;Right circumduction;Antalgic;Trendelenburg;Narrow base of support;Decreased hip/knee flexion - right    Ambulation Surface Level;Indoor    Gait Comments tactile/verbal cues for increased Rt hip flexion, and VC shortened Rt step length. Improved quality with cues.      Lumbar Exercises: Stretches   Passive Hamstring Stretch Right;20 seconds;Left;2 reps   supine with strap   Prone on Elbows Stretch 1 rep;30 seconds    Quad Stretch Right;2 reps;30 seconds   prone with strap   Piriformis Stretch Right;Left;2 reps;20 seconds   hooklying pulling knee towards opp shoulder   Gastroc Stretch Right;Left;2 reps;30 seconds   heel off of step     Lumbar Exercises: Aerobic   Nustep L4 x 5 minutes :LEs only       Lumbar Exercises: Standing   Other Standing Lumbar Exercises side stepping Lt/Rt 20 ft with light UE handheld support, cues to increased Rt step height and control (challenging).      Lumbar Exercises: Seated   Sit to Stand 10 reps   no UE, eccentric lowering  Sit to Stand Limitations Lt foot forward to encourage more WB into RLE.      Knee/Hip Exercises: Standing   Heel Raises Both;1 set;10 reps    Step Down Left;1 set;5 reps;Hand Hold: 2;Step Height: 6"   slowly stepping down; retro step up with RLE   SLS RLE x 3 reps, LLE x 2 reps    Other Standing Knee Exercises toe taps to 6" step without UE support, therapist guarding Rt knee from buckling x 10      Manual Therapy   Soft tissue mobilization STM to Rt glute med, max to decrease pain and improve mobility.           Balance Exercises - 09/24/20 0001      Balance Exercises: Standing   Partial Tandem Stance Eyes open;2 reps;20 secs;Intermittent upper extremity support   cues to not lock out knees, cues for upright posture                 PT Long Term Goals - 09/24/20 1135      PT LONG TERM GOAL #1   Title The patient will be indep with HEP.    Time 6    Period Weeks    Status On-going      PT LONG TERM GOAL #2   Title The patient will improve R hip abduction to 4/5.    Baseline R 3/5, L 4/5 for hip abduction    Time 6    Period Weeks    Status On-going      PT LONG TERM GOAL #3   Title The patient will improve gait speed from 2.02 ft/sec to > or equal to 2.62 ft/sec to demo improved community moiblity.    Time 6    Period Weeks    Status On-going      PT LONG TERM GOAL #4   Title The patient will maintain single leg stance x 5 seconds bilat.    Baseline L leg 10 seconds, R leg 3 seconds    Time 6    Period Weeks    Status On-going      PT LONG TERM GOAL #5   Title The patient will report dec'd low back pain to 2/10.    Time 6    Period Weeks    Status On-going                 Plan -  09/24/20 1059    Clinical Impression Statement Continued weakness in Rt hip abdct and ER; requires some assistance to stabilize pelvis for sidelying Rt hip exercises. Pt reports some irritation of low back at end of these exercises.  Rt SLS continues to be limited to 3 seconds.  Pt participates well throughout and remains motivated to progress towards therapy goals.    PT Frequency 2x / week    PT Duration 6 weeks    PT Treatment/Interventions ADLs/Self Care Home Management;Gait training;Stair training;Functional mobility training;Therapeutic activities;Therapeutic exercise;Balance training;Neuromuscular re-education;Manual techniques;Patient/family education;Taping;Electrical Stimulation;Orthotic Fit/Training    PT Next Visit Plan R hip strengthening, bilateral LE strength, low back stretching/core stability, standing balance.    PT Home Exercise Plan Access Code: [email or web address]    Consulted and Agree with Plan of Care Patient           Patient will benefit from skilled therapeutic intervention in order to improve the following deficits and impairments:  Abnormal gait,Impaired flexibility,Decreased strength,Difficulty walking,Decreased activity tolerance,Decreased balance,Impaired sensation  Visit Diagnosis: Muscle weakness (generalized)  Other  abnormalities of gait and mobility  Other symptoms and signs involving the nervous system  Unsteadiness on feet     Problem List Patient Active Problem List   Diagnosis Date Noted  . Cystoid macular edema of both eyes 07/28/2020  . Status post cervical spinal fusion 07/09/2020  . History of total bilateral knee replacement 07/08/2020  . Aortic atherosclerosis ([place]) 03/05/2020  . Emphysema, unspecified ([place]) 03/05/2020  . SIRS (systemic inflammatory response syndrome) ([place]) 03/04/2020  . Heme positive stool 02/29/2020  . Leg cramps 11/13/2019  . Tibialis anterior tendon tear, nontraumatic 11/13/2019  . 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 07/29/2018  . CPAP (continuous positive airway pressure) dependence 03/25/2018  . 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 Acute idiopathic gout of hand 03/25/2018  . Erectile dysfunction 06/20/2017  . 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 Peripheral neuropathy 06/19/2016  . Hearing loss 02/02/2016  . 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 Allergic rhinitis 02/02/2016  . Atypical chest pain 11/24/2015  . Preventative health care 11/24/2015  . Onychomycosis 06/30/2015  . Degenerative disc disease, lumbar 06/30/2015  . Other allergic rhinitis 02/18/2015  . Deviated nasal septum 02/18/2015  . OSA on CPAP 02/18/2015  . RLS (restless legs syndrome) 02/18/2015  . GERD (gastroesophageal reflux disease) 09/18/2014  . Hyperglycemia 03/03/2014  . Sleep apnea with use of continuous positive airway pressure (CPAP) 07/15/2013  . Carotid stenosis 02/24/2013  . Nonspecific abnormal electrocardiogram (ECG) (EKG) 01/06/2013  . DJD (degenerative joint disease) of hip 12/24/2012  . Lower GI bleed 12/12/2012  . Dyspnea 06/12/2011  . Hypothyroidism 05/13/2009  . HYPERCHOLESTEROLEMIA 05/13/2009  . Hyperlipidemia 05/13/2009  . 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  . Essential hypertension 05/13/2009  . INGUINAL HERNIA 05/13/2009  . HIATAL HERNIA 05/13/2009  . NEPHROLITHIASIS 05/13/2009  . ROSACEA 05/13/2009    WEAVER,CHRISTINA, Triplett, PTA 09/24/20 11:37 AM  [facility] [place], [place], [address] Phone: [phone]   Fax:  [phone]  Name: JOSHVA PENNINO MRN: [id number] Date of Birth: 12-Jun-1941

## 2020-09-28 ENCOUNTER — Encounter: Payer: Medicare HMO | Admitting: Rehabilitative and Restorative Service Providers"

## 2020-09-29 ENCOUNTER — Ambulatory Visit (INDEPENDENT_AMBULATORY_CARE_PROVIDER_SITE_OTHER): Payer: Medicare HMO | Admitting: Family

## 2020-09-29 ENCOUNTER — Encounter: Payer: Self-pay | Admitting: Family

## 2020-09-29 ENCOUNTER — Telehealth: Payer: Self-pay | Admitting: Family

## 2020-09-29 ENCOUNTER — Other Ambulatory Visit: Payer: Self-pay

## 2020-09-29 VITALS — BP 143/53 | HR 75 | Temp 97.9°F | Resp 16 | Ht 66.0 in | Wt 153.6 lb

## 2020-09-29 DIAGNOSIS — N289 Disorder of kidney and ureter, unspecified: Secondary | ICD-10-CM

## 2020-09-29 DIAGNOSIS — R739 Hyperglycemia, unspecified: Secondary | ICD-10-CM

## 2020-09-29 DIAGNOSIS — G959 Disease of spinal cord, unspecified: Secondary | ICD-10-CM | POA: Diagnosis not present

## 2020-09-29 DIAGNOSIS — E039 Hypothyroidism, unspecified: Secondary | ICD-10-CM | POA: Diagnosis not present

## 2020-09-29 DIAGNOSIS — E785 Hyperlipidemia, unspecified: Secondary | ICD-10-CM

## 2020-09-29 DIAGNOSIS — M109 Gout, unspecified: Secondary | ICD-10-CM

## 2020-09-29 DIAGNOSIS — J45909 Unspecified asthma, uncomplicated: Secondary | ICD-10-CM

## 2020-09-29 LAB — HEMOGLOBIN A1C: Hgb A1c MFr Bld: 5.3 % (ref 4.6–6.5)

## 2020-09-29 LAB — COMPREHENSIVE METABOLIC PANEL
ALT: 22 U/L (ref 0–53)
AST: 26 U/L (ref 0–37)
Albumin: 4.1 g/dL (ref 3.5–5.2)
Alkaline Phosphatase: 72 U/L (ref 39–117)
BUN: 25 mg/dL — ABNORMAL HIGH (ref 6–23)
CO2: 30 mEq/L (ref 19–32)
Calcium: 9.5 mg/dL (ref 8.4–10.5)
Chloride: 103 mEq/L (ref 96–112)
Creatinine, Ser: 1.23 mg/dL (ref 0.40–1.50)
GFR: 55.4 mL/min — ABNORMAL LOW (ref >60.00)
Glucose, Bld: 186 mg/dL — ABNORMAL HIGH (ref 70–99)
Potassium: 3.7 mEq/L (ref 3.5–5.1)
Sodium: 140 mEq/L (ref 135–145)
Total Bilirubin: 0.5 mg/dL (ref 0.2–1.2)
Total Protein: 6.7 g/dL (ref 6.0–8.3)

## 2020-09-29 NOTE — Progress Notes (Signed)
Subjective:    Patient ID: [name] Pratt, male    DOB: 1941-06-24, 80 y.o.   MRN: [id number]  HPI   Patient is an 80 yr old male who presents today for follow up.  Hyperlipidemia- maintained on crestor 5mg .  Lab Results  Component Value Date   CHOL 106 06/15/2020   HDL 38 (L) 06/15/2020   LDLCALC 50 06/15/2020   TRIG 98 06/15/2020   CHOLHDL 2.8 06/15/2020   Asthma-reports that this has been well controlled.  Reports that he uses albuterol only occasionally.   HTN- losartan 100mg  and amlodipine 5mg .   BP Readings from Last 3 Encounters:  09/29/20 (!) 143/53  09/08/20 (!) 179/86  08/25/20 (!) 147/71   Hypothyroid- maintained on 71mcg on sundays and 150 mcg synthroid all other days.  Lab Results  Component Value Date   TSH 1.01 06/15/2020   Gout- continues allopurinol 100mg  bid. Denies any gout flares.   He uses gabapentin in the evening which helps with pain and cramping in his legs.   Lab Results  Component Value Date   HGBA1C 5.6 09/19/2019   Last visit he noted RUE weakness.  We obtained an MRI of the C-spine which noted the following:   IMPRESSION: 1. 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 Cord flattening. Early abnormal T2 signal affecting the cord. Bilateral foraminal stenosis could affect either C4 nerve. 2. 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 Mild canal stenosis but no cord compression. Bilateral foraminal stenosis could affect either C5 nerve. 3. 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 Bilateral foraminal narrowing that could affect either C6 nerve. 4. Lesser, grossly non-compressive degenerative changes at the other levels as outlined above.  He underwent anterior cervical decompression of C3-4 with [name] on 07/09/20.  He is working with PT twice a week.  He reports that he still has some RUE numbness and weakness which comes and goes.   Review of Systems See HPI  Past Medical History:  Diagnosis Date  . Aortic  atherosclerosis ([place]) 03/05/2020  . Arthritis   . Cancer ([place])    skin cancer  . Carotid stenosis    a. Carotid U/S 8/18: LICA < 29%, RICA 93-71%;  b.  Carotid U/S 6/96:  RICA 78-93%; LICA 8-10%; f/u 1 year  . Chest pain     Myoview in 2008 was normal.  Echo 7/09: EF 60%, normal wall motion, mild LVH, mild LAE.  [name] Complication of anesthesia    "stomach does not wake up"  . COPD (chronic obstructive pulmonary disease) ([place])   . Emphysema, unspecified ([place]) 03/05/2020  . GERD (gastroesophageal reflux disease)   . Gout   . History of chicken pox   . History of hemorrhoids   . History of kidney stones   . Hyperlipidemia    under control  . Hypertension    under control  . Hypothyroidism   . Ileus ([place])   . Inguinal hernia   . OSA (obstructive sleep apnea)   . Pancreatitis   . PONV (postoperative nausea and vomiting)   . Rosacea   . 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     Social History   Socioeconomic History  . Marital status: Married    Spouse name: Not on file  . Number of children: 1  . Years of education: Not on file  . Highest education level: Not on file  Occupational History  . 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 Smoking status: Former Smoker    Years: 11.00    Types: Cigarettes    Quit date: 09/11/1978    Years since quitting: 42.0  . Smokeless tobacco: Never Used  Vaping Use  . Vaping Use: Never used  Substance and Sexual Activity  . Alcohol use: Yes    Alcohol/week: 0.0 standard drinks    Comment: rare  . Drug use: No  . Sexual activity: Not on file  Other Topics Concern  . Not on file  Social History Narrative   Lives with his wife   He has one son- lives locally.   2 grandchildren   Has worked for Research officer, trade union   Enjoys wood working   Completed HS.  [employer]   Social Determinants of Radio broadcast assistant Strain: Not on file  Food Insecurity: Not  on file  Transportation Needs: Not on file  Physical Activity: Not on file  Stress: Not on file  Social Connections: Not on file  Intimate Partner Violence: Not on file    Past Surgical History:  Procedure Laterality Date  . ABDOMINAL HERNIA REPAIR  07/15/12   Dr [name]  . ANTERIOR CERVICAL DECOMP/DISCECTOMY FUSION N/A 07/09/2020   Procedure: ANTERIOR CERVICAL DECOMPRESSION AND FUSION CERVICAL THREE-FOUR.;  Surgeon: [name], MD;  Location: [facility];  Service: Neurosurgery;  Laterality: N/A;  anterior  . BROW LIFT  05/07/01  . CYSTOSCOPY WITH URETEROSCOPY AND STENT PLACEMENT Right 01/28/2014   Procedure: CYSTOSCOPY WITH URETEROSCOPY, BASKET RETRIVAL AND  STENT PLACEMENT;  Surgeon: [name], MD;  [facility];  Service: Urology;  Laterality: Right;  . epidural injections     multiple procedures  . [place]   multiple times  . EYE SURGERY Bilateral 03/25/10, 2012   cataract removal  . HEMORRHOID SURGERY  2014  . HIATAL HERNIA REPAIR  12/21/93  . HOLMIUM LASER APPLICATION Right 8/41/6606   Procedure: HOLMIUM LASER APPLICATION;  Surgeon: [name], MD;  [facility];  Service: Urology;  Laterality: Right;  . INGUINAL HERNIA REPAIR Right 07/15/12   Dr [name], x2  . JOINT REPLACEMENT  07/25/04   right knee  . JOINT REPLACEMENT  07/13/10   left knee  . KNEE SURGERY  1995  . LAPAROSCOPIC CHOLECYSTECTOMY  09/20/06   with intraoperative cholangiogram and right inguinal herniorrhaphy with mesh   . LIGAMENT REPAIR Left 07/2013   shoulder  . LITHOTRIPSY     x2  . NASAL SEPTUM SURGERY  1975  . REFRACTIVE SURGERY Bilateral   . 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 SKIN CANCER DESTRUCTION     nose, ear  . TONSILLECTOMY  as child  . TOTAL HIP ARTHROPLASTY Left   . URETHRAL DILATION  1991   [name]    Family History  Problem Relation Age of Onset  . Cancer Mother   . Hypertension Other   . Cancer Other   . Osteoarthritis Other     Allergies   Allergen Reactions  . Elastic Bandages & [Zinc] Other (See Comments)    Teflon bandages - Irritation  . Pravastatin     Muscle cramps  . Sildenafil     Tachycardia    . Metoclopramide Hcl Anxiety    Current Outpatient Medications on File Prior to Visit  Medication Sig Dispense Refill  . acetaminophen (TYLENOL) 650 MG CR tablet Take 1,300 mg by mouth every 8 (  eight) hours as needed for pain.    [name] albuterol (VENTOLIN HFA) 108 (90 Base) MCG/ACT inhaler Inhale 2 puffs into the lungs every 6 (six) hours as needed for shortness of breath. 1 each 3  . allopurinol (ZYLOPRIM) 100 MG tablet TAKE 1 TABLET TWICE DAILY (Patient taking differently: Take 100 mg by mouth 2 (two) times daily.) 180 tablet 1  . amLODipine (NORVASC) 5 MG tablet Take 1 tablet (5 mg total) by mouth daily. 90 tablet 1  . Artificial Tear Solution (SOOTHE XP OP) Place 1 drop into both eyes daily.    [name] aspirin EC 81 MG tablet Take 81 mg by mouth every morning.    . cholecalciferol (VITAMIN D3) 25 MCG (1000 UNIT) tablet Take 1,000 Units by mouth daily.    . Cranberry 500 MG TABS Take 500 mg by mouth daily.     . cycloSPORINE (RESTASIS) 0.05 % ophthalmic emulsion Place 1 drop into both eyes 2 (two) times daily.    . diclofenac sodium (VOLTAREN) 1 % GEL Apply 1 application topically daily as needed (arthritis). 100 g 2  . docusate sodium (COLACE) 100 MG capsule Take 1 capsule (100 mg total) by mouth 2 (two) times daily as needed for mild constipation. (Patient taking differently: Take 100 mg by mouth daily.) 10 capsule 0  . ferrous sulfate 325 (65 FE) MG tablet Take 325 mg by mouth daily with breakfast.    . gabapentin (NEURONTIN) 300 MG capsule TAKE 1 CAPSULE TWO TO THREE TIMES DAILY AS NEEDED 180 capsule 1  . Homeopathic Products (THERAWORX RELIEF) FOAM Apply 1 application topically at bedtime.    [name] levothyroxine (SYNTHROID) 150 MCG tablet TAKE 1 TABLET ONE TIME DAILY ON 6 DAYS PER WEEK AND 1/2 TABLET ON 1 DAY PER WEEK (Patient  taking differently: Take 75-150 mcg by mouth See admin instructions. Take 75 mcg on Sundays, take 150 mcg daily Mon - Sat) 78 tablet 1  . losartan (COZAAR) 100 MG tablet Take 1 tablet (100 mg total) by mouth daily. 90 tablet 1  . Menthol, Topical Analgesic, (ZIMS MAX-FREEZE EX) Apply 1 application topically daily as needed (pain).    . multivitamin (THERAGRAN) per tablet Take 1 tablet by mouth daily.    . nitroGLYCERIN (NITROSTAT) 0.4 MG SL tablet Place 1 tablet (0.4 mg total) under the tongue every 5 (five) minutes as needed for chest pain. 25 tablet 3  . Omega-3 Fatty Acids (FISH OIL) 1200 MG CAPS Take 1,200 mg by mouth daily.    . rosuvastatin (CRESTOR) 5 MG tablet TAKE 1 TABLET EVERY DAY 90 tablet 0  . sodium chloride (OCEAN) 0.65 % nasal spray Place 1 spray into both nostrils at bedtime as needed for congestion.    [name] MYRBETRIQ 25 MG TB24 tablet Take 1 tablet (25 mg total) by mouth daily. 30 tablet 2   No current facility-administered medications on file prior to visit.    BP (!) 143/53 (BP Location: Right Arm, Patient Position: Sitting, Cuff Size: Small)   Pulse 75   Temp 97.9 F (36.6 C) (Oral)   Resp 16   Ht 5\' 6"  (1.676 m)   Wt 153 lb 9.6 oz (69.7 kg)   SpO2 99%   BMI 24.79 kg/m       Objective:   Physical Exam Constitutional:      General: He is not in acute distress.    Appearance: He is well-developed and well-nourished.  HENT:     Head: Normocephalic and atraumatic.  Cardiovascular:  Rate and Rhythm: Normal rate and regular rhythm.     Heart sounds: No murmur heard.   Pulmonary:     Effort: Pulmonary effort is normal. No respiratory distress.     Breath sounds: Normal breath sounds. No wheezing or rales.  Musculoskeletal:        General: No edema.  Skin:    General: Skin is warm and dry.  Neurological:     Mental Status: He is alert and oriented to person, place, and time.  Psychiatric:        Mood and Affect: Mood and affect normal.        Behavior:  Behavior normal.        Thought Content: Thought content normal.           Assessment & Plan:  HTN- bp stable. Continue amlodipine 5mg  and losartan 100mg .   Hyperglycemia- check A1C, CMET.  Hyperlipidemia- LDL at goal. Continue crestor 5mg .  Gout- stable on allopurinol.  Continue same.   Hypothyroid- TSH WNL, continue current dosing of synthroid 29mcg on sundays and 170mcg on all other days. Weight has been stable.  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 Stable- management per neurosurgery. He still has some numbness/weakness in his hands but dexterity is improving some. He continues to work with PT.  Asthma- stable with prn use of albuterol mdi.  CKD- followed by nephrology.   This visit occurred during the SARS-CoV-2 public health emergency.  Safety protocols were in place, including screening questions prior to the visit, additional usage of staff PPE, and extensive cleaning of exam room while observing appropriate contact time as indicated for disinfecting solutions.

## 2020-09-29 NOTE — Telephone Encounter (Signed)
Please request most recent office note from Dr. [name].

## 2020-09-29 NOTE — Patient Instructions (Signed)
Please complete lab work prior to leaving.   

## 2020-09-29 NOTE — Telephone Encounter (Signed)
Medical records request faxed to [facility] neurosurgery

## 2020-09-30 DIAGNOSIS — R3915 Urgency of urination: Secondary | ICD-10-CM | POA: Diagnosis not present

## 2020-09-30 DIAGNOSIS — N3941 Urge incontinence: Secondary | ICD-10-CM | POA: Diagnosis not present

## 2020-10-01 ENCOUNTER — Ambulatory Visit (INDEPENDENT_AMBULATORY_CARE_PROVIDER_SITE_OTHER): Payer: Medicare HMO | Admitting: Physical Therapy

## 2020-10-01 ENCOUNTER — Other Ambulatory Visit: Payer: Self-pay

## 2020-10-01 DIAGNOSIS — R2689 Other abnormalities of gait and mobility: Secondary | ICD-10-CM | POA: Diagnosis not present

## 2020-10-01 DIAGNOSIS — M6281 Muscle weakness (generalized): Secondary | ICD-10-CM

## 2020-10-01 DIAGNOSIS — R29818 Other symptoms and signs involving the nervous system: Secondary | ICD-10-CM | POA: Diagnosis not present

## 2020-10-01 DIAGNOSIS — R2681 Unsteadiness on feet: Secondary | ICD-10-CM

## 2020-10-01 NOTE — Therapy (Signed)
[facility] [place] [address] [place], [place], [address] Phone: [phone]   Fax:  [phone]  Physical Therapy Treatment  Patient Details  Name: [name] Pratt MRN: [id number] Date of Birth: [birth year] Referring Provider (PT): Donalee Citrin, MD   Encounter Date: 10/01/2020   PT End of Session - 10/01/20 1106    Visit Number 11    Number of Visits 20    Date for PT Re-Evaluation 10/27/20    Authorization Type [name]    Authorization - Visit Number 11    Authorization - Number of Visits 20    Progress Note Due on Visit 10    PT Start Time 1013    PT Stop Time 1058    PT Time Calculation (min) 45 min    Equipment Utilized During Treatment Gait belt    Activity Tolerance Patient tolerated treatment well    Behavior During Therapy WFL for tasks assessed/performed           Past Medical History:  Diagnosis Date  . Aortic atherosclerosis (HCC) 03/05/2020  . Arthritis   . Cancer (HCC)    skin cancer  . Carotid stenosis    a. Carotid U/S 5/13: LICA < 50%, RICA 50-69%;  b.  Carotid U/S 5/14:  RICA 40-59%; LICA 0-39%; f/u 1 year  . Chest pain     Myoview in 2008 was normal.  Echo 7/09: EF 60%, normal wall motion, mild LVH, mild LAE.  [name] Complication of anesthesia    "stomach does not wake up"  . COPD (chronic obstructive pulmonary disease) (HCC)   . Emphysema, unspecified (HCC) 03/05/2020  . GERD (gastroesophageal reflux disease)   . Gout   . History of chicken pox   . History of hemorrhoids   . History of kidney stones   . Hyperlipidemia    under control  . Hypertension    under control  . Hypothyroidism   . Ileus (HCC)   . Inguinal hernia   . OSA (obstructive sleep apnea)   . Pancreatitis   . PONV (postoperative nausea and vomiting)   . Rosacea   . 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 Fawn Kirk    Past Surgical History:  Procedure Laterality Date  . ABDOMINAL  HERNIA REPAIR  07/15/12   Dr Ashley Jacobs  . ANTERIOR CERVICAL DECOMP/DISCECTOMY FUSION N/A 07/09/2020   Procedure: ANTERIOR CERVICAL DECOMPRESSION AND FUSION CERVICAL THREE-FOUR.;  Surgeon: Donalee Citrin, MD;  [facility];  Service: Neurosurgery;  Laterality: N/A;  anterior  . BROW LIFT  05/07/01  . CYSTOSCOPY WITH URETEROSCOPY AND STENT PLACEMENT Right 01/28/2014   Procedure: CYSTOSCOPY WITH URETEROSCOPY, BASKET RETRIVAL AND  STENT PLACEMENT;  Surgeon: Valetta Fuller, MD;  Location: [facility];  Service: Urology;  Laterality: Right;  . epidural injections     multiple procedures  . ESOPHAGEAL DILATION  1993 and 1994   multiple times  . EYE SURGERY Bilateral 03/25/10, 2012   cataract removal  . HEMORRHOID SURGERY  2014  . HIATAL HERNIA REPAIR  12/21/93  . HOLMIUM LASER APPLICATION Right 01/28/2014   Procedure: HOLMIUM LASER APPLICATION;  Surgeon: Valetta Fuller, MD;  Location: [facility];  Service: Urology;  Laterality: Right;  . INGUINAL HERNIA REPAIR Right 07/15/12   Dr Ashley Jacobs, x2  . JOINT REPLACEMENT  07/25/04   right knee  . JOINT REPLACEMENT  07/13/10   left knee  . KNEE SURGERY  1995  . LAPAROSCOPIC  CHOLECYSTECTOMY  09/20/06   with intraoperative cholangiogram and right inguinal herniorrhaphy with mesh   . LIGAMENT REPAIR Left 07/2013   shoulder  . LITHOTRIPSY     x2  . NASAL SEPTUM SURGERY  1975  . REFRACTIVE SURGERY Bilateral   . 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 SKIN CANCER DESTRUCTION     nose, ear  . TONSILLECTOMY  as child  . TOTAL HIP ARTHROPLASTY Left   . URETHRAL DILATION  1991   Dr. Annabell Howells    There were no vitals filed for this visit.   Subjective Assessment - 10/01/20 1023    Subjective "I'm ehhhh".  Pt reports he continues to have weakness in his RLE that bothers him.  He did some shoveling of ice and snow and his body was sore afterwards.    Currently in Pain? No/denies    Pain Score 0-No pain   applied maxi-freeze/ Tyelnol 2 hrs prior to session             [facility]  PT Assessment - 10/01/20 0001      Assessment   Medical Diagnosis s/p cervical myelopathy, gait training focus only    Referring Provider (PT) Donalee Citrin, MD    Onset Date/Surgical Date 07/09/20    Hand Dominance Right    Next MD Visit 10/2020    Prior Therapy known to me from prior PT in 01/2020            [facility] Adult PT Treatment/Exercise - 10/01/20 0001      Ambulation/Gait   Ambulation Distance (Feet) 100 Feet    Gait Pattern Step-through pattern;Decreased hip/knee flexion - right;Decreased dorsiflexion - right;Decreased weight shift to right;Trendelenburg;Trunk flexed    Ambulation Surface Level;Indoor    Gait Comments tactile/verbal cues for more upright posture and increased Rt hip flexion.. Improved quality with cues.      Lumbar Exercises: Stretches   Passive Hamstring Stretch Right;Left;2 reps;20 seconds    Piriformis Stretch Right;Left;2 reps;20 seconds   seated pulling knee towards opp shoulder     Lumbar Exercises: Aerobic   Nustep L4 x 5 minutes :LEs only      Lumbar Exercises: Seated   Sit to Stand 10 reps   no UE, eccentric lowering   Sit to Stand Limitations Lt foot forward to encourage more WB into RLE.           Balance Exercises - 10/01/20 0001      Balance Exercises: Standing   Standing Eyes Opened Narrow base of support (BOS);Foam/compliant surface;1 rep;30 secs    Standing Eyes Closed Foam/compliant surface;2 reps;10 secs    Wall Bumps Eyes opened;Hip;10 reps;Other reps (comment)   foam surface.tactile cues for posture upon upright   Stepping Strategy Anterior;Posterior;Lateral;Foam/compliant surface;10 reps   RLE   Rockerboard Anterior/posterior;Lateral;EO;20 seconds    Sidestepping 1 rep   15 ft Lt   Step Over Hurdles / Cones stepping over pool noodle, and backwards back to starting position, x 5 reps each leg leading without UE support, close SBA                  PT Long Term Goals - 09/24/20 1135      PT LONG TERM GOAL #1   Title The  patient will be indep with HEP.    Time 6    Period Weeks    Status On-going      PT LONG TERM GOAL #2   Title The patient will improve R hip  abduction to 4/5.    Baseline R 3/5, L 4/5 for hip abduction    Time 6    Period Weeks    Status On-going      PT LONG TERM GOAL #3   Title The patient will improve gait speed from 2.02 ft/sec to > or equal to 2.62 ft/sec to demo improved community moiblity.    Time 6    Period Weeks    Status On-going      PT LONG TERM GOAL #4   Title The patient will maintain single leg stance x 5 seconds bilat.    Baseline L leg 10 seconds, R leg 3 seconds    Time 6    Period Weeks    Status On-going      PT LONG TERM GOAL #5   Title The patient will report dec'd low back pain to 2/10.    Time 6    Period Weeks    Status On-going                 Plan - 10/01/20 1214    Clinical Impression Statement Session focused on balance and RLE strengthening in standing with intermittent to no UE support to steady.  Gait quality for short distances improves until RLE fatigues;  pt then returns to gait with increased trendelenburg, wide based, forward trunk flexion with circumduction of RLE.  He tolerated exercises well and participated well throughout.  He complained of increased low back discomfort when standing with forward flexed trunk; tactile / VC required to remind him to return to upright posture thereby eliminating his LBP. Pt progressing gradually towards remaining goals.    PT Frequency 2x / week    PT Duration 6 weeks    PT Treatment/Interventions ADLs/Self Care Home Management;Gait training;Stair training;Functional mobility training;Therapeutic activities;Therapeutic exercise;Balance training;Neuromuscular re-education;Manual techniques;Patient/family education;Taping;Electrical Stimulation;Orthotic Fit/Training    PT Next Visit Plan R hip strengthening, bilateral LE strength, low back stretching/core stability, standing balance.    PT Home  Exercise Plan Access Code: [email or web address]    Consulted and Agree with Plan of Care Patient           Patient will benefit from skilled therapeutic intervention in order to improve the following deficits and impairments:  Abnormal gait,Impaired flexibility,Decreased strength,Difficulty walking,Decreased activity tolerance,Decreased balance,Impaired sensation  Visit Diagnosis: Muscle weakness (generalized)  Other abnormalities of gait and mobility  Other symptoms and signs involving the nervous system  Unsteadiness on feet     Problem List Patient Active Problem List   Diagnosis Date Noted  . Cystoid macular edema of both eyes 07/28/2020  . Status post cervical spinal fusion 07/09/2020  . History of total bilateral knee replacement 07/08/2020  . Aortic atherosclerosis ([place]) 03/05/2020  . Emphysema, unspecified ([place]) 03/05/2020  . SIRS (systemic inflammatory response syndrome) ([place]) 03/04/2020  . Heme positive stool 02/29/2020  . Leg cramps 11/13/2019  . Tibialis anterior tendon tear, nontraumatic 11/13/2019  . 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 CPAP (continuous positive airway pressure) dependence 03/25/2018  . 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 Acute idiopathic gout of hand 03/25/2018  . Erectile dysfunction 06/20/2017  . 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 Peripheral neuropathy 06/19/2016  . Hearing loss 02/02/2016  . 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 Allergic rhinitis 02/02/2016  . Atypical chest pain 11/24/2015  . Preventative health care 11/24/2015  . Onychomycosis 06/30/2015  . Degenerative disc disease, lumbar 06/30/2015  .  Other allergic rhinitis 02/18/2015  . Deviated nasal septum 02/18/2015  . OSA on CPAP 02/18/2015  . RLS (restless legs syndrome) 02/18/2015  . GERD (gastroesophageal reflux disease) 09/18/2014  . Hyperglycemia 03/03/2014  . Sleep apnea with use of continuous positive airway  pressure (CPAP) 07/15/2013  . Carotid stenosis 02/24/2013  . Nonspecific abnormal electrocardiogram (ECG) (EKG) 01/06/2013  . DJD (degenerative joint disease) of hip 12/24/2012  . Lower GI bleed 12/12/2012  . Dyspnea 06/12/2011  . Hypothyroidism 05/13/2009  . HYPERCHOLESTEROLEMIA 05/13/2009  . Hyperlipidemia 05/13/2009  . 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  . Essential hypertension 05/13/2009  . INGUINAL HERNIA 05/13/2009  . HIATAL HERNIA 05/13/2009  . NEPHROLITHIASIS 05/13/2009  . ROSACEA 05/13/2009   Kerin Perna, PTA 10/01/20 12:19 PM  [place], [place], [address] Phone: [phone]   Fax:  [phone]  Name: [name] Pratt MRN: [id number] Date of Birth: 18-Jun-1941

## 2020-10-05 ENCOUNTER — Other Ambulatory Visit: Payer: Self-pay

## 2020-10-05 ENCOUNTER — Ambulatory Visit (INDEPENDENT_AMBULATORY_CARE_PROVIDER_SITE_OTHER): Payer: Medicare HMO | Admitting: Rehabilitative and Restorative Service Providers"

## 2020-10-05 DIAGNOSIS — M6281 Muscle weakness (generalized): Secondary | ICD-10-CM | POA: Diagnosis not present

## 2020-10-05 DIAGNOSIS — R29818 Other symptoms and signs involving the nervous system: Secondary | ICD-10-CM | POA: Diagnosis not present

## 2020-10-05 DIAGNOSIS — N3941 Urge incontinence: Secondary | ICD-10-CM | POA: Diagnosis not present

## 2020-10-05 DIAGNOSIS — R2689 Other abnormalities of gait and mobility: Secondary | ICD-10-CM | POA: Diagnosis not present

## 2020-10-05 NOTE — Therapy (Signed)
[facility] [place], [place], [address] Phone: [phone]   Fax:  [phone]  Physical Therapy Treatment  Patient Details  Name: [name] Pratt MRN: [id number] Date of Birth: June 27, 1941 Referring Provider (PT): [name], MD   Encounter Date: 10/05/2020   PT End of Session - 10/05/20 1003    Visit Number 12    Number of Visits 20    Date for PT Re-Evaluation 10/27/20    Authorization Type [name]  *12 visits from 1/5-2/16/22    Authorization - Visit Number 12    Authorization - Number of Visits 20    Progress Note Due on Visit 20    PT Start Time 1008    PT Stop Time 1050    PT Time Calculation (min) 42 min    Equipment Utilized During Treatment Gait belt    Activity Tolerance Patient tolerated treatment well    Behavior During Therapy WFL for tasks assessed/performed           Past Medical History:  Diagnosis Date  . Aortic atherosclerosis ([place]) 03/05/2020  . Arthritis   . Cancer ([place])    skin cancer  . Carotid stenosis    a. Carotid U/S 5/46: LICA < 27%, RICA 03-50%;  b.  Carotid U/S 0/93:  RICA 81-82%; LICA 9-93%; f/u 1 year  . Chest pain     Myoview in 2008 was normal.  Echo 7/09: EF 60%, normal wall motion, mild LVH, mild LAE.  [name] Complication of anesthesia    "stomach does not wake up"  . COPD (chronic obstructive pulmonary disease) ([place])   . Emphysema, unspecified ([place]) 03/05/2020  . GERD (gastroesophageal reflux disease)   . Gout   . History of chicken pox   . History of hemorrhoids   . History of kidney stones   . Hyperlipidemia    under control  . Hypertension    under control  . Hypothyroidism   . Ileus ([place])   . Inguinal hernia   . OSA (obstructive sleep apnea)   . Pancreatitis   . PONV (postoperative nausea and vomiting)   . Rosacea   . 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    Past Surgical History:  Procedure  Laterality Date  . ABDOMINAL HERNIA REPAIR  07/15/12   Dr [name]  . ANTERIOR CERVICAL DECOMP/DISCECTOMY FUSION N/A 07/09/2020   Procedure: ANTERIOR CERVICAL DECOMPRESSION AND FUSION CERVICAL THREE-FOUR.;  Surgeon: [name], MD;  Location: [place];  Service: Neurosurgery;  Laterality: N/A;  anterior  . BROW LIFT  05/07/01  . CYSTOSCOPY WITH URETEROSCOPY AND STENT PLACEMENT Right 01/28/2014   Procedure: CYSTOSCOPY WITH URETEROSCOPY, BASKET RETRIVAL AND  STENT PLACEMENT;  Surgeon: [name], MD;  Location: [facility];  Service: Urology;  Laterality: Right;  . epidural injections     multiple procedures  . [place]   multiple times  . EYE SURGERY Bilateral 03/25/10, 2012   cataract removal  . HEMORRHOID SURGERY  2014  . HIATAL HERNIA REPAIR  12/21/93  . HOLMIUM LASER APPLICATION Right 03/26/9677   Procedure: HOLMIUM LASER APPLICATION;  Surgeon: [name], MD;  Location: [facility];  Service: Urology;  Laterality: Right;  . INGUINAL HERNIA REPAIR Right 07/15/12   Dr [name], x2  . JOINT REPLACEMENT  07/25/04   right knee  . JOINT REPLACEMENT  07/13/10   left knee  . KNEE SURGERY  [place]  09/20/06   with intraoperative cholangiogram and right inguinal herniorrhaphy with mesh   . LIGAMENT REPAIR Left 07/2013   shoulder  . LITHOTRIPSY     x2  . NASAL SEPTUM SURGERY  1975  . REFRACTIVE SURGERY Bilateral   . 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 SKIN CANCER DESTRUCTION     nose, ear  . TONSILLECTOMY  as child  . TOTAL HIP ARTHROPLASTY Left   . URETHRAL DILATION  1991   [name]    There were no vitals filed for this visit.   Subjective Assessment - 10/05/20 1008    Subjective The patient reports weakness.  He went to breakfast this morning and then sat in the car for an hour before walking into therapy.    Pertinent History neuropathy, HTN, lumbar DDD, hypothyroidism, carotid stenosis    Patient Stated Goals "I want my leg to get  better and walk without limping."    Currently in Pain? Yes    Pain Score --   none now   Pain Location Back    Pain Orientation Lower    Pain Descriptors / Indicators Aching;Tightness;Sore    Pain Type Chronic pain    Pain Onset More than a month ago    Aggravating Factors  prolonged sitting    Pain Relieving Factors getting up and moving    Multiple Pain Sites Yes    Pain Score --   none at rest, just weakness   Pain Location Hip    Pain Orientation Right;Posterior;Lateral    Pain Descriptors / Indicators Aching;Other (Comment)   weakness   Pain Type Chronic pain    Pain Onset More than a month ago    Pain Frequency Intermittent    Aggravating Factors  walking    Pain Relieving Factors rest              [facility] PT Assessment - 10/05/20 1012      Assessment   Medical Diagnosis s/p cervical myelopathy, gait training focus only    Referring Provider (PT) [name], MD    Onset Date/Surgical Date 07/09/20    Hand Dominance Right    Next MD Visit 10/2020                         [facility] Adult PT Treatment/Exercise - 10/05/20 1013      Ambulation/Gait   Ambulation/Gait Yes    Ambulation/Gait Assistance 6: Modified independent (Device/Increase time)    Ambulation Distance (Feet) 100 Feet    Assistive device None    Ambulation Surface Level;Indoor      Neuro Re-ed    Neuro Re-ed Details  Single leg stance activities abducting L hip in hinge position near countertop with R UE support,   Standing R and L weight shift while sliding contralateral LE along ground laterally and A/P      Exercises   Exercises Knee/Hip;Lumbar      Lumbar Exercises: Aerobic   Nustep L5 x 4 minutes LEs only      Lumbar Exercises: Supine   Bridge with clamshell Non-compliant;10 reps;3 seconds    Bridge with [employer] Limitations with red theraband      Knee/Hip Exercises: Stretches   Passive Hamstring Stretch Right;Left;1 rep;60 seconds    Quad Stretch Right;Left;3 reps;30  seconds    Quad Stretch Limitations prone    Gastroc Stretch Right;Left;1 rep;30 seconds      Knee/Hip  Exercises: Aerobic   Elliptical x 1.5 minutes with tactile cues R knee      Knee/Hip Exercises: Standing   Terminal Knee Extension Strengthening;Right;10 reps    Functional Squat 10 reps    Functional Squat Limitations reaching to 15" surface and return to standing      Knee/Hip Exercises: Sidelying   Clams 10 reps R side. cues for core engaged.  limited range.     Other Sidelying Knee/Hip Exercises reverse clam x 10 reps      Knee/Hip Exercises: Prone   Hamstring Curl 1 set;10 reps    Hamstring Curl Limitations with cramping of med HS-- did P/ROM stretching and then continued    Other Prone Exercises prone quad set R and L x 10 reps            *discussed home gym equipment and updated home program.       PT Education - 10/05/20 1115    Education Details discussed accessibility to home gym equipment    Person(s) Educated Patient    Methods Explanation;Demonstration;Handout    Comprehension Verbalized understanding;Returned demonstration               PT Long Term Goals - 09/24/20 1135      PT LONG TERM GOAL #1   Title The patient will be indep with HEP.    Time 6    Period Weeks    Status On-going      PT LONG TERM GOAL #2   Title The patient will improve R hip abduction to 4/5.    Baseline R 3/5, L 4/5 for hip abduction    Time 6    Period Weeks    Status On-going      PT LONG TERM GOAL #3   Title The patient will improve gait speed from 2.02 ft/sec to > or equal to 2.62 ft/sec to demo improved community moiblity.    Time 6    Period Weeks    Status On-going      PT LONG TERM GOAL #4   Title The patient will maintain single leg stance x 5 seconds bilat.    Baseline L leg 10 seconds, R leg 3 seconds    Time 6    Period Weeks    Status On-going      PT LONG TERM GOAL #5   Title The patient will report dec'd low back pain to 2/10.    Time 6     Period Weeks    Status On-going                 Plan - 10/05/20 1116    Clinical Impression Statement The patient had improved quality of gait after ther ex for R leg stance and hip activation.  PT to continue to work towards [employer] emphasizing safety with walking and normalizing gait mechanics.    PT Treatment/Interventions ADLs/Self Care Home Management;Gait training;Stair training;Functional mobility training;Therapeutic activities;Therapeutic exercise;Balance training;Neuromuscular re-education;Manual techniques;Patient/family education;Taping;Electrical Stimulation;Orthotic Fit/Training    PT Next Visit Plan R hip strengthening, bilateral LE strength, low back stretching/core stability, standing balance.    PT Home Exercise Plan Access Code: [email or web address]    Consulted and Agree with Plan of Care Patient           Patient will benefit from skilled therapeutic intervention in order to improve the following deficits and impairments:     Visit Diagnosis: Muscle weakness (generalized)  Other abnormalities of gait and mobility  Other symptoms and signs  involving the nervous system     Problem List Patient Active Problem List   Diagnosis Date Noted  . Cystoid macular edema of both eyes 07/28/2020  . Status post cervical spinal fusion 07/09/2020  . History of total bilateral knee replacement 07/08/2020  . Aortic atherosclerosis (HCC) 03/05/2020  . Emphysema, unspecified (HCC) 03/05/2020  . SIRS (systemic inflammatory response syndrome) (HCC) 03/04/2020  . Heme positive stool 02/29/2020  . Leg cramps 11/13/2019  . Tibialis anterior tendon tear, nontraumatic 11/13/2019  . 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 CPAP (continuous positive airway pressure) dependence 03/25/2018  . 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 Acute idiopathic gout of hand 03/25/2018  . Erectile dysfunction 06/20/2017  . 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 Peripheral neuropathy 06/19/2016  . Hearing loss 02/02/2016  . 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 Allergic rhinitis 02/02/2016  . Atypical chest pain 11/24/2015  . Preventative health care 11/24/2015  . Onychomycosis 06/30/2015  . Degenerative disc disease, lumbar 06/30/2015  . Other allergic rhinitis 02/18/2015  . Deviated nasal septum 02/18/2015  . OSA on CPAP 02/18/2015  . RLS (restless legs syndrome) 02/18/2015  . GERD (gastroesophageal reflux disease) 09/18/2014  . Hyperglycemia 03/03/2014  . Sleep apnea with use of continuous positive airway pressure (CPAP) 07/15/2013  . Carotid stenosis 02/24/2013  . Nonspecific abnormal electrocardiogram (ECG) (EKG) 01/06/2013  . DJD (degenerative joint disease) of hip 12/24/2012  . Lower GI bleed 12/12/2012  . Dyspnea 06/12/2011  . Hypothyroidism 05/13/2009  . HYPERCHOLESTEROLEMIA 05/13/2009  . Hyperlipidemia 05/13/2009  . 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  . Essential hypertension 05/13/2009  . INGUINAL HERNIA 05/13/2009  . HIATAL HERNIA 05/13/2009  . NEPHROLITHIASIS 05/13/2009  . ROSACEA 05/13/2009    [employer], PT 10/05/2020, 11:17 AM  [facility] [address] [place], [place], [address] Phone: [phone]   Fax:  [phone]  Name: DAVED MCFANN MRN: [id number] Date of Birth: Mar 16, 1941

## 2020-10-05 NOTE — Patient Instructions (Signed)
Access Code: [email or web address] URL: [email or web address] Date: 10/05/2020 Prepared by: [name]  Program Notes The total gym can replace the squat with chair touch.  You can use your bike (add low resistance) or try the elliptical as long as your knee does not pop back.   Exercises Squat with Chair Touch - 2 x daily - 7 x weekly - 1 sets - 10 reps   **Can be replaced by total gym at home** Side Stepping with Counter Support - 1 x daily - 3 x weekly - 1 sets - 10 reps  **Can be replaced by leg master at home** Beginner Bridge - 1 x daily - 7 x weekly - 2 sets - 5 reps  Sidelying Hip Abduction - 2 x daily - 7 x weekly - 2 sets - 5 reps  Supine Butterfly Groin Stretch - 2 x daily - 7 x weekly - 1 sets - 3 reps - 30 seconds hold Supine Piriformis Stretch with Foot on Ground - 1 x daily - 7 x weekly - 1 sets - 2 reps - 30 sec hold

## 2020-10-08 ENCOUNTER — Ambulatory Visit: Payer: Medicare HMO | Admitting: Rehabilitative and Restorative Service Providers"

## 2020-10-08 ENCOUNTER — Other Ambulatory Visit: Payer: Self-pay

## 2020-10-08 DIAGNOSIS — M6281 Muscle weakness (generalized): Secondary | ICD-10-CM

## 2020-10-08 DIAGNOSIS — R2681 Unsteadiness on feet: Secondary | ICD-10-CM | POA: Diagnosis not present

## 2020-10-08 DIAGNOSIS — R29818 Other symptoms and signs involving the nervous system: Secondary | ICD-10-CM | POA: Diagnosis not present

## 2020-10-08 DIAGNOSIS — R2689 Other abnormalities of gait and mobility: Secondary | ICD-10-CM

## 2020-10-08 NOTE — Therapy (Signed)
[place], [place], [address] Phone: [phone]   Fax:  [phone]  Physical Therapy Treatment  Patient Details  Name: [name] Pratt MRN: [id number] Date of Birth: 1941-03-18 Referring Provider (PT): [name], MD   Encounter Date: 10/08/2020   PT End of Session - 10/08/20 1116    Visit Number 13    Number of Visits 20    Date for PT Re-Evaluation 10/27/20    Authorization Type [name]  *12 visits from 1/5-2/16/22    Authorization - Visit Number 6    Authorization - Number of Visits 12    Progress Note Due on Visit 20    PT Start Time 7782    PT Stop Time 1111    PT Time Calculation (min) 53 min    Equipment Utilized During Treatment Gait belt    Activity Tolerance Patient tolerated treatment well    Behavior During Therapy [facility] for tasks assessed/performed           Past Medical History:  Diagnosis Date  . Aortic atherosclerosis ([place]) 03/05/2020  . Arthritis   . Cancer ([place])    skin cancer  . Carotid stenosis    a. Carotid U/S 4/23: LICA < 53%, RICA 61-44%;  b.  Carotid U/S 3/15:  RICA 40-08%; LICA 6-76%; f/u 1 year  . Chest pain     Myoview in 2008 was normal.  Echo 7/09: EF 60%, normal wall motion, mild LVH, mild LAE.  [name] Complication of anesthesia    "stomach does not wake up"  . COPD (chronic obstructive pulmonary disease) ([place])   . Emphysema, unspecified ([place]) 03/05/2020  . GERD (gastroesophageal reflux disease)   . Gout   . History of chicken pox   . History of hemorrhoids   . History of kidney stones   . Hyperlipidemia    under control  . Hypertension    under control  . Hypothyroidism   . Ileus ([place])   . Inguinal hernia   . OSA (obstructive sleep apnea)   . Pancreatitis   . PONV (postoperative nausea and vomiting)   . Rosacea   . 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    Past Surgical History:  Procedure  Laterality Date  . ABDOMINAL HERNIA REPAIR  07/15/12   Dr [name]  . ANTERIOR CERVICAL DECOMP/DISCECTOMY FUSION N/A 07/09/2020   Procedure: ANTERIOR CERVICAL DECOMPRESSION AND FUSION CERVICAL THREE-FOUR.;  Surgeon: [name], MD;  Location: [place];  Service: Neurosurgery;  Laterality: N/A;  anterior  . BROW LIFT  05/07/01  . CYSTOSCOPY WITH URETEROSCOPY AND STENT PLACEMENT Right 01/28/2014   Procedure: CYSTOSCOPY WITH URETEROSCOPY, BASKET RETRIVAL AND  STENT PLACEMENT;  Surgeon: [name], MD;  [facility];  Service: Urology;  Laterality: Right;  . epidural injections     multiple procedures  . [place]   multiple times  . EYE SURGERY Bilateral 03/25/10, 2012   cataract removal  . HEMORRHOID SURGERY  2014  . HIATAL HERNIA REPAIR  12/21/93  . HOLMIUM LASER APPLICATION Right 1/95/0932   Procedure: HOLMIUM LASER APPLICATION;  Surgeon: [name], MD;  [facility];  Service: Urology;  Laterality: Right;  . INGUINAL HERNIA REPAIR Right 07/15/12   Dr [name], x2  . JOINT REPLACEMENT  07/25/04   right knee  . JOINT REPLACEMENT  07/13/10   left knee  . KNEE SURGERY  [place]  09/20/06   with intraoperative cholangiogram and right inguinal herniorrhaphy with mesh   . LIGAMENT REPAIR Left 07/2013   shoulder  . LITHOTRIPSY     x2  . NASAL SEPTUM SURGERY  1975  . REFRACTIVE SURGERY Bilateral   . 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 SKIN CANCER DESTRUCTION     nose, ear  . TONSILLECTOMY  as child  . TOTAL HIP ARTHROPLASTY Left   . URETHRAL DILATION  1991   [name]    There were no vitals filed for this visit.   Subjective Assessment - 10/08/20 1029    Subjective The patient reports some soreness with ther ex.    Pertinent History neuropathy, HTN, lumbar DDD, hypothyroidism, carotid stenosis    Patient Stated Goals "I want my leg to get better and walk without limping."    Currently in Pain? Yes    Pain Score --   when  he wakes, but it goes away   Pain Location Back              [facility] PT Assessment - 10/08/20 1029      Assessment   Medical Diagnosis s/p cervical myelopathy, gait training focus only    Referring Provider (PT) [name], MD    Onset Date/Surgical Date 07/09/20    Hand Dominance Right                         [facility] Adult PT Treatment/Exercise - 10/08/20 1029      Self-Care   Self-Care Other Self-Care Comments    Other Self-Care Comments  discussed shoewear and provided resources for [employer] shoes in [place] to order what he needs      Neuro Re-ed    Neuro Re-ed Details  Balance and coordination tasks with quick, alternating foot taps to steps, ant/posterior rocking with feet in stride position, quick movements moving L foot to/from 4" to 8" step, and standing while stepping over a quad cane with intermittent UE support and CGA.      Exercises   Exercises Knee/Hip;Lumbar      Lumbar Exercises: Stretches   Passive Hamstring Stretch Right;Left;2 reps;30 seconds    Passive Hamstring Stretch Limitations then adductor stretch passively    Other Lumbar Stretch Exercise gastroc stretch standing on slant board x 30 seconds    Other Lumbar Stretch Exercise butterfly stretch      Lumbar Exercises: Aerobic   Nustep L5 x 4 minutes LEs only      Lumbar Exercises: Supine   Bridge Non-compliant;10 reps    Bridge Limitations with legs on physioball    Other Supine Lumbar Exercises physioball bridge with knees to chest x 10 reps    Other Supine Lumbar Exercises physioball bridge with alternating leg lifts with assistance x 10 reps      Lumbar Exercises: Sidelying   Hip Abduction 10 reps    Other Sidelying Lumbar Exercises reverse clam x 10 reps with 2 lbs      Knee/Hip Exercises: Stretches   Gastroc Stretch Right;Left;1 rep;30 seconds    Gastroc Stretch Limitations slant board      Knee/Hip Exercises: Standing   Heel Raises Limitations toe raises x 10 reps with  intermittent UE support    Functional Squat 2 sets;10 reps    Functional Squat Limitations reaching to 15" box and then deeper squats with red band for hip abduction engagement  SLS R and L LE x 10 seconds x 3 reps                       PT Long Term Goals - 09/24/20 1135      PT LONG TERM GOAL #1   Title The patient will be indep with HEP.    Time 6    Period Weeks    Status On-going      PT LONG TERM GOAL #2   Title The patient will improve R hip abduction to 4/5.    Baseline R 3/5, L 4/5 for hip abduction    Time 6    Period Weeks    Status On-going      PT LONG TERM GOAL #3   Title The patient will improve gait speed from 2.02 ft/sec to > or equal to 2.62 ft/sec to demo improved community moiblity.    Time 6    Period Weeks    Status On-going      PT LONG TERM GOAL #4   Title The patient will maintain single leg stance x 5 seconds bilat.    Baseline L leg 10 seconds, R leg 3 seconds    Time 6    Period Weeks    Status On-going      PT LONG TERM GOAL #5   Title The patient will report dec'd low back pain to 2/10.    Time 6    Period Weeks    Status On-going                 Plan - 10/08/20 1119    Clinical Impression Statement The patient fatigues t/o session.  He notes that he continues to have to think about walking ot prevent tripping on L toes.  PT discussed use of prior AFO for L foot.  He does not like to use the AFO and notes back pain + difficulty getting brace on and off as barriers.  We discussed options if needed.  Continue working ot [employer].    PT Treatment/Interventions ADLs/Self Care Home Management;Gait training;Stair training;Functional mobility training;Therapeutic activities;Therapeutic exercise;Balance training;Neuromuscular re-education;Manual techniques;Patient/family education;Taping;Electrical Stimulation;Orthotic Fit/Training    PT Next Visit Plan R hip strengthening, bilateral LE strength, low back stretching/core stability,  standing balance.    PT Home Exercise Plan Access Code: [email or web address]    Consulted and Agree with Plan of Care Patient           Patient will benefit from skilled therapeutic intervention in order to improve the following deficits and impairments:     Visit Diagnosis: No diagnosis found.     Problem List Patient Active Problem List   Diagnosis Date Noted  . Cystoid macular edema of both eyes 07/28/2020  . Status post cervical spinal fusion 07/09/2020  . History of total bilateral knee replacement 07/08/2020  . Aortic atherosclerosis ([place]) 03/05/2020  . Emphysema, unspecified ([place]) 03/05/2020  . SIRS (systemic inflammatory response syndrome) ([place]) 03/04/2020  . Heme positive stool 02/29/2020  . Leg cramps 11/13/2019  . Tibialis anterior tendon tear, nontraumatic 11/13/2019  . 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 CPAP (continuous positive airway pressure) dependence 03/25/2018  . 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 Acute idiopathic gout of hand 03/25/2018  . Erectile dysfunction 06/20/2017  . 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 Peripheral neuropathy 06/19/2016  . Hearing loss 02/02/2016  . 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  Allergic rhinitis 02/02/2016  . Atypical chest pain 11/24/2015  . Preventative health care 11/24/2015  . Onychomycosis 06/30/2015  . Degenerative disc disease, lumbar 06/30/2015  . Other allergic rhinitis 02/18/2015  . Deviated nasal septum 02/18/2015  . OSA on CPAP 02/18/2015  . RLS (restless legs syndrome) 02/18/2015  . GERD (gastroesophageal reflux disease) 09/18/2014  . Hyperglycemia 03/03/2014  . Sleep apnea with use of continuous positive airway pressure (CPAP) 07/15/2013  . Carotid stenosis 02/24/2013  . Nonspecific abnormal electrocardiogram (ECG) (EKG) 01/06/2013  . DJD (degenerative joint disease) of hip 12/24/2012  . Lower GI bleed 12/12/2012  . Dyspnea 06/12/2011  .  Hypothyroidism 05/13/2009  . HYPERCHOLESTEROLEMIA 05/13/2009  . Hyperlipidemia 05/13/2009  . 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  . Essential hypertension 05/13/2009  . INGUINAL HERNIA 05/13/2009  . HIATAL HERNIA 05/13/2009  . NEPHROLITHIASIS 05/13/2009  . ROSACEA 05/13/2009    Mordecai Tindol, PT 10/08/2020, 11:20 AM  [facility] [place], [place], [address] Phone: [phone]   Fax:  [phone]  Name: [name] Pratt MRN: [id number] Date of Birth: July 21, 1941

## 2020-10-12 ENCOUNTER — Other Ambulatory Visit: Payer: Self-pay

## 2020-10-12 ENCOUNTER — Ambulatory Visit (INDEPENDENT_AMBULATORY_CARE_PROVIDER_SITE_OTHER): Payer: Medicare HMO | Admitting: Rehabilitative and Restorative Service Providers"

## 2020-10-12 DIAGNOSIS — R2689 Other abnormalities of gait and mobility: Secondary | ICD-10-CM | POA: Diagnosis not present

## 2020-10-12 DIAGNOSIS — N3941 Urge incontinence: Secondary | ICD-10-CM | POA: Diagnosis not present

## 2020-10-12 DIAGNOSIS — R29818 Other symptoms and signs involving the nervous system: Secondary | ICD-10-CM | POA: Diagnosis not present

## 2020-10-12 DIAGNOSIS — M6281 Muscle weakness (generalized): Secondary | ICD-10-CM

## 2020-10-12 DIAGNOSIS — R3915 Urgency of urination: Secondary | ICD-10-CM | POA: Diagnosis not present

## 2020-10-12 NOTE — Therapy (Signed)
[place], [place], [address] Phone: [phone]   Fax:  [phone]  Physical Therapy Treatment  Patient Details  Name: [name] Pratt MRN: [id number] Date of Birth: 1941/06/06 Referring Provider (PT): [name], MD   Encounter Date: 10/12/2020   PT End of Session - 10/12/20 1125    Visit Number 14    Number of Visits 20    Date for PT Re-Evaluation 10/27/20    Authorization Type [name]  *12 visits from 1/5-2/16/22    Authorization - Visit Number 7    Authorization - Number of Visits 12    Progress Note Due on Visit 20    PT Start Time 1105    PT Stop Time 1145    PT Time Calculation (min) 40 min    Equipment Utilized During Treatment Gait belt    Activity Tolerance Patient tolerated treatment well    Behavior During Therapy [facility] for tasks assessed/performed           Past Medical History:  Diagnosis Date  . Aortic atherosclerosis ([place]) 03/05/2020  . Arthritis   . Cancer ([place])    skin cancer  . Carotid stenosis    a. Carotid U/S 2/77: LICA < 82%, RICA 42-35%;  b.  Carotid U/S 3/61:  RICA 44-31%; LICA 5-40%; f/u 1 year  . Chest pain     Myoview in 2008 was normal.  Echo 7/09: EF 60%, normal wall motion, mild LVH, mild LAE.  [name] Complication of anesthesia    "stomach does not wake up"  . COPD (chronic obstructive pulmonary disease) ([place])   . Emphysema, unspecified ([place]) 03/05/2020  . GERD (gastroesophageal reflux disease)   . Gout   . History of chicken pox   . History of hemorrhoids   . History of kidney stones   . Hyperlipidemia    under control  . Hypertension    under control  . Hypothyroidism   . Ileus ([place])   . Inguinal hernia   . OSA (obstructive sleep apnea)   . Pancreatitis   . PONV (postoperative nausea and vomiting)   . Rosacea   . 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    Past Surgical History:  Procedure  Laterality Date  . ABDOMINAL HERNIA REPAIR  07/15/12   Dr [name]  . ANTERIOR CERVICAL DECOMP/DISCECTOMY FUSION N/A 07/09/2020   Procedure: ANTERIOR CERVICAL DECOMPRESSION AND FUSION CERVICAL THREE-FOUR.;  Surgeon: [name], MD;  Location: [place];  Service: Neurosurgery;  Laterality: N/A;  anterior  . BROW LIFT  05/07/01  . CYSTOSCOPY WITH URETEROSCOPY AND STENT PLACEMENT Right 01/28/2014   Procedure: CYSTOSCOPY WITH URETEROSCOPY, BASKET RETRIVAL AND  STENT PLACEMENT;  Surgeon: [name], MD;  [facility];  Service: Urology;  Laterality: Right;  . epidural injections     multiple procedures  . [place]   multiple times  . EYE SURGERY Bilateral 03/25/10, 2012   cataract removal  . HEMORRHOID SURGERY  2014  . HIATAL HERNIA REPAIR  12/21/93  . HOLMIUM LASER APPLICATION Right 0/86/7619   Procedure: HOLMIUM LASER APPLICATION;  Surgeon: [name], MD;  [facility];  Service: Urology;  Laterality: Right;  . INGUINAL HERNIA REPAIR Right 07/15/12   Dr [name], x2  . JOINT REPLACEMENT  07/25/04   right knee  . JOINT REPLACEMENT  07/13/10   left knee  . KNEE SURGERY  [place]  09/20/06   with intraoperative cholangiogram and right inguinal herniorrhaphy with mesh   . LIGAMENT REPAIR Left 07/2013   shoulder  . LITHOTRIPSY     x2  . NASAL SEPTUM SURGERY  1975  . REFRACTIVE SURGERY Bilateral   . 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 SKIN CANCER DESTRUCTION     nose, ear  . TONSILLECTOMY  as child  . TOTAL HIP ARTHROPLASTY Left   . URETHRAL DILATION  1991   [name]    There were no vitals filed for this visit.   Subjective Assessment - 10/12/20 1119    Subjective The patient reports he worked out in his gym and got sore.  He did less # of minutes of machines yesterday and tolerated that better.    Pertinent History neuropathy, HTN, lumbar DDD, hypothyroidism, carotid stenosis    Patient Stated Goals "I want my leg to get  better and walk without limping."    Currently in Pain? No/denies              [facility] PT Assessment - 10/12/20 1125      Assessment   Medical Diagnosis s/p cervical myelopathy, gait training focus only    Referring Provider (PT) [name], MD    Onset Date/Surgical Date 07/09/20                         [facility] Adult PT Treatment/Exercise - 10/12/20 1125      Therapeutic Activites    Therapeutic Activities Other Therapeutic Activities    Other Therapeutic Activities floor<>stand transfer with patient demonstrating mod indep with transfer      Neuro Re-ed    Neuro Re-ed Details  walking heels and toes near countertop, backwards walking marching near support surface.  Patient performed 1/4 turns with CGA and sidestepping over yoga blocks for weight shift.  Alternating foot taps to 6" step dec'ing UE support.  Also performed lateral stepping over yoga blocks with min A x 5 rep sR and L sides      Exercises   Exercises Knee/Hip;Lumbar      Lumbar Exercises: Stretches   Passive Hamstring Stretch Right;Left;2 reps;30 seconds    Single Knee to Chest Stretch Right;Left;1 rep;60 seconds      Lumbar Exercises: Aerobic   Elliptical x 4 minutes with bilat UEs      Lumbar Exercises: Seated   Other Seated Lumbar Exercises tall kneeling with alternating R and L movements to 1/2 kneeling x 10 repetitions      Lumbar Exercises: Supine   Bridge with March 5 reps      Knee/Hip Exercises: Stretches   Press photographer Both;1 rep;60 seconds    Gastroc Stretch Limitations slant board      Knee/Hip Exercises: Supine   Bridges 5 reps    Bridges Limitations attempted marching, but patient gets HS cramp, therefore did supine marching without bridge                       PT Long Term Goals - 10/12/20 1319      PT LONG TERM GOAL #1   Title The patient will be indep with HEP.    Time 6    Period Weeks    Status On-going      PT LONG TERM GOAL #2   Title The  patient will improve R hip abduction to 4/5.    Baseline  R 3/5, L 4/5 for hip abduction    Time 6    Period Weeks    Status On-going      PT LONG TERM GOAL #3   Title The patient will improve gait speed from 2.02 ft/sec to > or equal to 2.62 ft/sec to demo improved community moiblity.    Time 6    Period Weeks    Status On-going      PT LONG TERM GOAL #4   Title The patient will maintain single leg stance x 5 seconds bilat.    Baseline L leg 10 seconds, R leg 10 seconds on 10/12/20    Time 6    Period Weeks    Status Achieved      PT LONG TERM GOAL #5   Title The patient will report dec'd low back pain to 2/10.    Time 6    Period Weeks    Status Achieved                 Plan - 10/12/20 1318    Clinical Impression Statement The patinet has met 2 LTGs.  He appears to have improved standing posture and strength today.  He feels that home gym routine is helping progress mobility.  PT to continue working to [employer].    PT Treatment/Interventions ADLs/Self Care Home Management;Gait training;Stair training;Functional mobility training;Therapeutic activities;Therapeutic exercise;Balance training;Neuromuscular re-education;Manual techniques;Patient/family education;Taping;Electrical Stimulation;Orthotic Fit/Training    PT Next Visit Plan R hip strengthening, bilateral LE strength, low back stretching/core stability, standing balance.    PT Home Exercise Plan Access Code: [email or web address]    Consulted and Agree with Plan of Care Patient           Patient will benefit from skilled therapeutic intervention in order to improve the following deficits and impairments:     Visit Diagnosis: Muscle weakness (generalized)  Other abnormalities of gait and mobility  Other symptoms and signs involving the nervous system     Problem List Patient Active Problem List   Diagnosis Date Noted  . Cystoid macular edema of both eyes 07/28/2020  . Status post cervical spinal fusion 07/09/2020  .  History of total bilateral knee replacement 07/08/2020  . Aortic atherosclerosis ([place]) 03/05/2020  . Emphysema, unspecified ([place]) 03/05/2020  . SIRS (systemic inflammatory response syndrome) ([place]) 03/04/2020  . Heme positive stool 02/29/2020  . Leg cramps 11/13/2019  . Tibialis anterior tendon tear, nontraumatic 11/13/2019  . 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 CPAP (continuous positive airway pressure) dependence 03/25/2018  . 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 Acute idiopathic gout of hand 03/25/2018  . Erectile dysfunction 06/20/2017  . 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 Peripheral neuropathy 06/19/2016  . Hearing loss 02/02/2016  . 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 Allergic rhinitis 02/02/2016  . Atypical chest pain 11/24/2015  . Preventative health care 11/24/2015  . Onychomycosis 06/30/2015  . Degenerative disc disease, lumbar 06/30/2015  . Other allergic rhinitis 02/18/2015  . Deviated nasal septum 02/18/2015  . OSA on CPAP 02/18/2015  . RLS (restless legs syndrome) 02/18/2015  . GERD (gastroesophageal reflux disease) 09/18/2014  . Hyperglycemia 03/03/2014  . Sleep apnea with use of continuous positive airway pressure (CPAP) 07/15/2013  . Carotid stenosis 02/24/2013  . Nonspecific abnormal electrocardiogram (ECG) (EKG) 01/06/2013  . DJD (degenerative joint disease) of hip 12/24/2012  . Lower GI bleed 12/12/2012  . Dyspnea 06/12/2011  . Hypothyroidism 05/13/2009  . HYPERCHOLESTEROLEMIA  05/13/2009  . Hyperlipidemia 05/13/2009  . 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  . Essential hypertension 05/13/2009  . INGUINAL HERNIA 05/13/2009  . HIATAL HERNIA 05/13/2009  . NEPHROLITHIASIS 05/13/2009  . ROSACEA 05/13/2009    [place], PT 10/12/2020, 1:22 PM  [facility] [place], [place], [address] Phone: [phone]   Fax:  [phone]  Name:  [name] Pratt MRN: [id number] Date of Birth: July 17, 1941

## 2020-10-15 ENCOUNTER — Other Ambulatory Visit: Payer: Self-pay

## 2020-10-15 ENCOUNTER — Telehealth: Payer: Medicare HMO

## 2020-10-15 ENCOUNTER — Ambulatory Visit (INDEPENDENT_AMBULATORY_CARE_PROVIDER_SITE_OTHER): Payer: Medicare HMO | Admitting: Physical Therapy

## 2020-10-15 DIAGNOSIS — M6281 Muscle weakness (generalized): Secondary | ICD-10-CM | POA: Diagnosis not present

## 2020-10-15 DIAGNOSIS — R29818 Other symptoms and signs involving the nervous system: Secondary | ICD-10-CM | POA: Diagnosis not present

## 2020-10-15 DIAGNOSIS — R2689 Other abnormalities of gait and mobility: Secondary | ICD-10-CM | POA: Diagnosis not present

## 2020-10-15 NOTE — Therapy (Signed)
[facility] [place], [place], [address] Phone: [phone]   Fax:  [phone]  Physical Therapy Treatment  Patient Details  Name: [name] Pratt MRN: [id number] Date of Birth: [birth year] Referring Provider (PT): [name], MD   Encounter Date: 10/15/2020   PT End of Session - 10/15/20 1159    Visit Number 15    Number of Visits 20    Date for PT Re-Evaluation 10/27/20    Authorization Type [name]  *12 visits from 1/5-2/16/22    Authorization - Visit Number 8    Authorization - Number of Visits 12    Progress Note Due on Visit 20    PT Start Time 1150    PT Stop Time 1228    PT Time Calculation (min) 38 min    Equipment Utilized During Treatment Gait belt    Activity Tolerance Patient tolerated treatment well    Behavior During Therapy [facility] for tasks assessed/performed           Past Medical History:  Diagnosis Date  . Aortic atherosclerosis ([place]) 03/05/2020  . Arthritis   . Cancer ([employer])    skin cancer  . Carotid stenosis    a. Carotid U/S 6/96: LICA < 29%, RICA 52-84%;  b.  Carotid U/S 1/32:  RICA 44-01%; LICA 0-27%; f/u 1 year  . Chest pain     Myoview in 2008 was normal.  Echo 7/09: EF 60%, normal wall motion, mild LVH, mild LAE.  [name] Complication of anesthesia    "stomach does not wake up"  . COPD (chronic obstructive pulmonary disease) ([place])   . Emphysema, unspecified ([place]) 03/05/2020  . GERD (gastroesophageal reflux disease)   . Gout   . History of chicken pox   . History of hemorrhoids   . History of kidney stones   . Hyperlipidemia    under control  . Hypertension    under control  . Hypothyroidism   . Ileus ([place])   . Inguinal hernia   . OSA (obstructive sleep apnea)   . Pancreatitis   . PONV (postoperative nausea and vomiting)   . Rosacea   . 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    Past Surgical History:  Procedure  Laterality Date  . ABDOMINAL HERNIA REPAIR  07/15/12   Dr [name]  . ANTERIOR CERVICAL DECOMP/DISCECTOMY FUSION N/A 07/09/2020   Procedure: ANTERIOR CERVICAL DECOMPRESSION AND FUSION CERVICAL THREE-FOUR.;  Surgeon: [name], MD;  Location: [facility];  Service: Neurosurgery;  Laterality: N/A;  anterior  . BROW LIFT  05/07/01  . CYSTOSCOPY WITH URETEROSCOPY AND STENT PLACEMENT Right 01/28/2014   Procedure: CYSTOSCOPY WITH URETEROSCOPY, BASKET RETRIVAL AND  STENT PLACEMENT;  Surgeon: [name], MD;  Location: [facility];  Service: Urology;  Laterality: Right;  . epidural injections     multiple procedures  . [place]   multiple times  . EYE SURGERY Bilateral 03/25/10, 2012   cataract removal  . HEMORRHOID SURGERY  2014  . HIATAL HERNIA REPAIR  12/21/93  . HOLMIUM LASER APPLICATION Right 2/53/6644   Procedure: HOLMIUM LASER APPLICATION;  Surgeon: [name], MD;  Location: [facility];  Service: Urology;  Laterality: Right;  . INGUINAL HERNIA REPAIR Right 07/15/12   Dr [name], x2  . JOINT REPLACEMENT  07/25/04   right knee  . JOINT REPLACEMENT  07/13/10   left knee  . KNEE SURGERY  [place]  09/20/06   with intraoperative cholangiogram and right inguinal herniorrhaphy with mesh   . LIGAMENT REPAIR Left 07/2013   shoulder  . LITHOTRIPSY     x2  . NASAL SEPTUM SURGERY  1975  . REFRACTIVE SURGERY Bilateral   . 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 SKIN CANCER DESTRUCTION     nose, ear  . TONSILLECTOMY  as child  . TOTAL HIP ARTHROPLASTY Left   . URETHRAL DILATION  1991   [name]    There were no vitals filed for this visit.   Subjective Assessment - 10/15/20 1158    Subjective Pt voices that he sees improvement, but "I still feel weak in that Rt leg when I get up".  Pt reports he is sleeping better now, with less cramps.    Pertinent History neuropathy, HTN, lumbar DDD, hypothyroidism, carotid stenosis    Patient Stated Goals "I want  my leg to get better and walk without limping."    Currently in Pain? No/denies   just stiff.             [facility] PT Assessment - 10/15/20 0001      Assessment   Medical Diagnosis s/p cervical myelopathy, gait training focus only    Referring Provider (PT) [name], MD    Onset Date/Surgical Date 07/09/20      Strength   Right Hip Flexion 4-/5   with extensor lag   Right Hip ABduction 4-/5   gives initially, but then able to hold to moderate            [facility] Adult PT Treatment/Exercise - 10/15/20 0001      Neuro Re-ed    Neuro Re-ed Details  Backwards marching with dec'in UE support x 32 ft.   Patient performed sidestepping over yoga blocks for weight shift and forward step/ backward step over yoga block with intermittent UE support on counter.      Lumbar Exercises: Stretches   Passive Hamstring Stretch Right;Left;1 rep;30 seconds    Single Knee to Chest Stretch Right;Left;1 rep;60 seconds    Piriformis Stretch Right;Left;1 rep;30 seconds      Knee/Hip Exercises: Aerobic   Elliptical L1: 4 min      Knee/Hip Exercises: Standing   Other Standing Knee Exercises heel walking (lateral) x 8 ft; toe walking (lateral) x 8 ft with light to intermittent UE support      Knee/Hip Exercises: Supine   Single Leg Bridge Right;Left;1 set;5 reps   fig 4   Straight Leg Raises Strengthening;Right;1 set;10 reps    Straight Leg Raises Limitations cues to keep knee straight, and flex foot      Knee/Hip Exercises: Sidelying   Hip ABduction Strengthening;Right;3 sets;5 reps             PT Long Term Goals - 10/15/20 1214      PT LONG TERM GOAL #1   Title The patient will be indep with HEP.    Time 6    Period Weeks    Status On-going      PT LONG TERM GOAL #2   Title The patient will improve R hip abduction to 4/5.    Time 6    Period Weeks    Status Partially Met      PT LONG TERM GOAL #3   Title The patient will improve gait speed from 2.02 ft/sec to > or equal to 2.62  ft/sec to demo improved community  moiblity.    Time 6    Period Weeks    Status On-going      PT LONG TERM GOAL #4   Title The patient will maintain single leg stance x 5 seconds bilat.    Baseline L leg 10 seconds, R leg 10 seconds on 10/12/20    Time 6    Period Weeks    Status Achieved      PT LONG TERM GOAL #5   Title The patient will report dec'd low back pain to 2/10.    Time 6    Period Weeks    Status Achieved                 Plan - 10/15/20 1242    Clinical Impression Statement Pt demonstrates improved Rt hip strength.  His legs fatigued with eliptical and mat exercises, making it more challenging to perform standing dynamic balance exercises.  Pt continues to make good gains towards remaining LTGs each visit.    Rehab Potential Good    PT Frequency 2x / week    PT Duration 6 weeks    PT Treatment/Interventions ADLs/Self Care Home Management;Gait training;Stair training;Functional mobility training;Therapeutic activities;Therapeutic exercise;Balance training;Neuromuscular re-education;Manual techniques;Patient/family education;Taping;Electrical Stimulation;Orthotic Fit/Training    PT Next Visit Plan R hip strengthening, bilateral LE strength, low back stretching/core stability, standing balance.    PT Home Exercise Plan Access Code: [email or web address]    Consulted and Agree with Plan of Care Patient           Patient will benefit from skilled therapeutic intervention in order to improve the following deficits and impairments:  Abnormal gait,Impaired flexibility,Decreased strength,Difficulty walking,Decreased activity tolerance,Decreased balance,Impaired sensation  Visit Diagnosis: Muscle weakness (generalized)  Other abnormalities of gait and mobility  Other symptoms and signs involving the nervous system     Problem List Patient Active Problem List   Diagnosis Date Noted  . Cystoid macular edema of both eyes 07/28/2020  . Status post cervical spinal fusion  07/09/2020  . History of total bilateral knee replacement 07/08/2020  . Aortic atherosclerosis ([place]) 03/05/2020  . Emphysema, unspecified ([place]) 03/05/2020  . SIRS (systemic inflammatory response syndrome) ([place]) 03/04/2020  . Heme positive stool 02/29/2020  . Leg cramps 11/13/2019  . Tibialis anterior tendon tear, nontraumatic 11/13/2019  . 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 CPAP (continuous positive airway pressure) dependence 03/25/2018  . 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 Acute idiopathic gout of hand 03/25/2018  . Erectile dysfunction 06/20/2017  . 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 Peripheral neuropathy 06/19/2016  . Hearing loss 02/02/2016  . 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 Allergic rhinitis 02/02/2016  . Atypical chest pain 11/24/2015  . Preventative health care 11/24/2015  . Onychomycosis 06/30/2015  . Degenerative disc disease, lumbar 06/30/2015  . Other allergic rhinitis 02/18/2015  . Deviated nasal septum 02/18/2015  . OSA on CPAP 02/18/2015  . RLS (restless legs syndrome) 02/18/2015  . GERD (gastroesophageal reflux disease) 09/18/2014  . Hyperglycemia 03/03/2014  . Sleep apnea with use of continuous positive airway pressure (CPAP) 07/15/2013  . Carotid stenosis 02/24/2013  . Nonspecific abnormal electrocardiogram (ECG) (EKG) 01/06/2013  . DJD (degenerative joint disease) of hip 12/24/2012  . Lower GI bleed 12/12/2012  . Dyspnea 06/12/2011  . Hypothyroidism 05/13/2009  . HYPERCHOLESTEROLEMIA 05/13/2009  . Hyperlipidemia 05/13/2009  . 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  . Essential hypertension 05/13/2009  . INGUINAL HERNIA 05/13/2009  . HIATAL HERNIA 05/13/2009  .  NEPHROLITHIASIS 05/13/2009  . ROSACEA 05/13/2009   Kerin Perna, PTA 10/15/20 12:49 PM  [facility] [place], [place], [address] Phone: [phone]   Fax:   [phone]  Name: [name] Pratt MRN: [id number] Date of Birth: 1940-11-19

## 2020-10-15 NOTE — Progress Notes (Deleted)
Chronic Care Management Pharmacy Note  10/15/2020 Name:  [name] Pratt MRN:  [id number] DOB:  [birth year]  Subjective: [name] Pratt is an 80 y.o. year old male who is a primary patient of [name], NP.  The CCM team was consulted for assistance with disease management and care coordination needs.    Engaged with patient by telephone for follow up visit in response to provider referral for pharmacy case management and/or care coordination services.   Consent to Services:  The patient was given the following information about Chronic Care Management services today, agreed to services, and gave verbal consent: 1. CCM service includes personalized support from designated clinical staff supervised by the primary care provider, including individualized plan of care and coordination with other care providers 2. 24/7 contact phone numbers for assistance for urgent and routine care needs. 3. Service will only be billed when office clinical staff spend 20 minutes or more in a month to coordinate care. 4. Only one practitioner may furnish and bill the service in a calendar month. 5.The patient may stop CCM services at any time (effective at the end of the month) by phone call to the office staff. 6. The patient will be responsible for cost sharing (co-pay) of up to 20% of the service fee (after annual deductible is met). Patient agreed to services and consent obtained.  Patient Care Team: [name], NP as PCP - General (Internal Medicine) [name], MD as PCP - Cardiology (Cardiology) Zadie Rhine Clent Demark, MD as Consulting Physician (Ophthalmology) [name], MD as Consulting Physician (Cardiology) Dohmeier, Asencion Partridge, MD as Consulting Physician (Neurology) Marshell Garfinkel, MD as Consulting Physician (Pulmonary Disease) Darleen Crocker, MD as Consulting Physician (Ophthalmology) Nyra Capes, DDS as Consulting Physician (Dentistry) Day, Melvenia Beam, [facility] as Pharmacist  (Pharmacist)  Recent office visits: 09/29/20 [name] - general follow up, no med changes, sugar mildly elevated but A1c normal  08/25/20 [name] - discussing weight loss, all labs normal, ordered CT of chest, abdomen, and pelvis for further eval  06/29/20: Visit w/ [name], NP - Nephro changed losartan/hctz to losartan only. Pt has not started and recommended to leave aside for now. Continue losartan 29m and hydralazine 511m Recent consult visits: 07/09/20: Sports med visit w/ Dr. ScRaeford Razor No concern for the surgical hardware at the knee. Counseled on supportive care, hinged knee brace, rolling walker, and could consider PT.   06/28/20: Cardio visit w/ Dr. NiJohnsie Cancel no med changes noted.  Objective:  Lab Results  Component Value Date   CREATININE 1.23 09/29/2020   BUN 25 (H) 09/29/2020   GFR 55.40 (L) 09/29/2020   GFRNONAA 37 (L) 07/07/2020   GFRAA 51 (L) 03/05/2020   NA 140 09/29/2020   K 3.7 09/29/2020   CALCIUM 9.5 09/29/2020   CO2 30 09/29/2020    Lab Results  Component Value Date/Time   HGBA1C 5.3 09/29/2020 10:24 AM   HGBA1C 5.6 09/19/2019 10:18 AM   GFR 55.40 (L) 09/29/2020 10:24 AM   GFR 54.91 (L) 08/25/2020 11:14 AM    Last diabetic Eye exam: No results found for: HMDIABEYEEXA  Last diabetic Foot exam: No results found for: HMDIABFOOTEX   Lab Results  Component Value Date   CHOL 106 06/15/2020   HDL 38 (L) 06/15/2020   LDLCALC 50 06/15/2020   TRIG 98 06/15/2020   CHOLHDL 2.8 06/15/2020    Hepatic Function Latest Ref Rng & Units 09/29/2020 06/15/2020 03/03/2020  Total Protein 6.0 - 8.3 g/dL 6.7 7.1  7.9  Albumin 3.5 - 5.2 g/dL 4.1 - 4.2  AST 0 - 37 U/L 26 28 46(H)  ALT 0 - 53 U/L '22 16 29  ' Alk Phosphatase 39 - 117 U/L 72 - 41  Total Bilirubin 0.2 - 1.2 mg/dL 0.5 0.5 0.5  Bilirubin, Direct 0.0 - 0.3 mg/dL - - -    Lab Results  Component Value Date/Time   TSH 1.01 06/15/2020 12:35 PM   TSH 1.38 02/20/2020 03:55 PM    CBC Latest  Ref Rng & Units 07/07/2020 03/05/2020 03/04/2020  WBC 4.0 - 10.5 K/uL 5.3 3.9(L) 4.8  Hemoglobin 13.0 - 17.0 g/dL 11.5(L) 11.6(L) 12.3(L)  Hematocrit 39.0 - 52.0 % 36.0(L) 34.6(L) 37.6(L)  Platelets 150 - 400 K/uL 373 285 277    No results found for: VD25OH  Clinical ASCVD: Yes  The ASCVD Risk score Mikey Bussing DC Jr., et al., 2013) failed to calculate for the following reasons:   The 2013 ASCVD risk score is only valid for ages 67 to 5    Depression screen PHQ 2/9 09/29/2020 09/19/2019 04/22/2018  Decreased Interest 0 0 0  Down, Depressed, Hopeless 0 0 0  PHQ - 2 Score 0 0 0  Some recent data might be hidden     Social History   Tobacco Use  Smoking Status Former Smoker  . Years: 11.00  . Types: Cigarettes  . Quit date: 09/11/1978  . Years since quitting: 42.1  Smokeless Tobacco Never Used   BP Readings from Last 3 Encounters:  09/29/20 (!) 143/53  09/08/20 (!) 179/86  08/25/20 (!) 147/71   Pulse Readings from Last 3 Encounters:  09/29/20 75  09/08/20 71  08/25/20 62   Wt Readings from Last 3 Encounters:  09/29/20 153 lb 9.6 oz (69.7 kg)  09/08/20 148 lb (67.1 kg)  08/25/20 151 lb (68.5 kg)    Assessment/Interventions: Review of patient past medical history, allergies, medications, health status, including review of consultants reports, laboratory and other test data, was performed as part of comprehensive evaluation and provision of chronic care management services.   SDOH:  (Social Determinants of Health) assessments and interventions performed:   CCM Care Plan  Allergies  Allergen Reactions  . Elastic Bandages & [Zinc] Other (See Comments)    Teflon bandages - Irritation  . Pravastatin     Muscle cramps  . Sildenafil     Tachycardia    . Metoclopramide Hcl Anxiety    Medications Reviewed Today    Reviewed by Mervyn Gay, PT (Physical Therapist) on 10/12/20 at 1317  Med List Status: <None>  Medication Order Taking? Sig Documenting Provider Last Dose  Status Informant  acetaminophen (TYLENOL) 650 MG CR tablet [id number] No Take 1,300 mg by mouth every 8 (eight) hours as needed for pain. [provider] Taking Active Self  albuterol (VENTOLIN HFA) 108 (90 Base) MCG/ACT inhaler [id number] No Inhale 2 puffs into the lungs every 6 (six) hours as needed for shortness of breath. [name], NP Taking Active Self  allopurinol (ZYLOPRIM) 100 MG tablet [id number] No TAKE 1 TABLET TWICE DAILY  Patient taking differently: Take 100 mg by mouth 2 (two) times daily.   [name], NP Taking Active   amLODipine (NORVASC) 5 MG tablet [id number] No Take 1 tablet (5 mg total) by mouth daily. [name], NP Taking Active   Artificial Tear Solution (SOOTHE XP OP) [id number] No Place 1 drop into both eyes daily. [provider] Taking Active Self  aspirin EC 81 MG  tablet [id number] No Take 81 mg by mouth every morning. [provider] Taking Active Self  cholecalciferol (VITAMIN D3) 25 MCG (1000 UNIT) tablet [id number] No Take 1,000 Units by mouth daily. [provider] Taking Active Self  Cranberry 500 MG TABS [id number] No Take 500 mg by mouth daily.  [provider] Taking Active Self  cycloSPORINE (RESTASIS) 0.05 % ophthalmic emulsion [id number] No Place 1 drop into both eyes 2 (two) times daily. [provider] Taking Active Self  diclofenac sodium (VOLTAREN) 1 % GEL [id number] No Apply 1 application topically daily as needed (arthritis). [name], NP Taking Active Self  docusate sodium (COLACE) 100 MG capsule [id number] No Take 1 capsule (100 mg total) by mouth 2 (two) times daily as needed for mild constipation.  Patient taking differently: Take 100 mg by mouth daily.   [name], NP Taking Active   ferrous sulfate 325 (65 FE) MG tablet [id number] No Take 325 mg by mouth daily with breakfast. [provider] Taking Active Self  gabapentin (NEURONTIN) 300 MG capsule  [id number] No TAKE 1 CAPSULE TWO TO THREE TIMES DAILY AS NEEDED [name], NP Taking Active   Homeopathic Products ([place]) FOAM [id number] No Apply 1 application topically at bedtime. [provider] Taking Active Self  levothyroxine (SYNTHROID) 150 MCG tablet [id number] No TAKE 1 TABLET ONE TIME DAILY ON 6 DAYS PER WEEK AND 1/2 TABLET ON 1 DAY PER WEEK  Patient taking differently: Take 75-150 mcg by mouth See admin instructions. Take 75 mcg on Sundays, take 150 mcg daily Mon - Sat   [name], NP Taking Active Self  losartan (COZAAR) 100 MG tablet [id number] No Take 1 tablet (100 mg total) by mouth daily. [name], NP Taking Active   Menthol, Topical Analgesic, (ZIMS MAX-FREEZE EX) [id number] No Apply 1 application topically daily as needed (pain). [provider] Taking Active Self  multivitamin [facility]) per tablet [id number] No Take 1 tablet by mouth daily. [provider] Taking Active Self  nitroGLYCERIN (NITROSTAT) 0.4 MG SL tablet [id number] No Place 1 tablet (0.4 mg total) under the tongue every 5 (five) minutes as needed for chest pain. [name], MD Taking Active Self  Omega-3 Fatty Acids (FISH OIL) 1200 MG CAPS [id number] No Take 1,200 mg by mouth daily. [provider] Taking Active Self  rosuvastatin (CRESTOR) 5 MG tablet [id number] No TAKE 1 TABLET EVERY DAY [name], NP Taking Active   sodium chloride (OCEAN) 0.65 % nasal spray [id number] No Place 1 spray into both nostrils at bedtime as needed for congestion. [provider] Taking Active Self          Patient Active Problem List   Diagnosis Date Noted  . Cystoid macular edema of both eyes 07/28/2020  . Status post cervical spinal fusion 07/09/2020  . History of total bilateral knee replacement 07/08/2020  . Aortic atherosclerosis ([place]) 03/05/2020  . Emphysema, unspecified ([place]) 03/05/2020  . SIRS (systemic inflammatory response  syndrome) ([place]) 03/04/2020  . Heme positive stool 02/29/2020  . Leg cramps 11/13/2019  . Tibialis anterior tendon tear, nontraumatic 11/13/2019  . 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 CPAP (continuous positive airway pressure) dependence 03/25/2018  . 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 Acute idiopathic gout of hand 03/25/2018  . Erectile dysfunction 06/20/2017  . 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 Peripheral neuropathy 06/19/2016  . Hearing loss 02/02/2016  . 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  Allergic rhinitis 02/02/2016  . Atypical chest pain 11/24/2015  . Preventative health care 11/24/2015  . Onychomycosis 06/30/2015  . Degenerative disc disease, lumbar 06/30/2015  . Other allergic rhinitis 02/18/2015  . Deviated nasal septum 02/18/2015  . OSA on CPAP 02/18/2015  . RLS (restless legs syndrome) 02/18/2015  . GERD (gastroesophageal reflux disease) 09/18/2014  . Hyperglycemia 03/03/2014  . Sleep apnea with use of continuous positive airway pressure (CPAP) 07/15/2013  . Carotid stenosis 02/24/2013  . Nonspecific abnormal electrocardiogram (ECG) (EKG) 01/06/2013  . DJD (degenerative joint disease) of hip 12/24/2012  . Lower GI bleed 12/12/2012  . Dyspnea 06/12/2011  . Hypothyroidism 05/13/2009  . HYPERCHOLESTEROLEMIA 05/13/2009  . Hyperlipidemia 05/13/2009  . 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  . Essential hypertension 05/13/2009  . INGUINAL HERNIA 05/13/2009  . HIATAL HERNIA 05/13/2009  . NEPHROLITHIASIS 05/13/2009  . ROSACEA 05/13/2009    Immunization History  Administered Date(s) Administered  . Fluad Quad(high Dose 65+) 05/29/2019, 05/28/2020  . Influenza Split 05/17/2012  . Influenza, High Dose Seasonal PF 06/19/2016, 06/20/2017, 06/17/2018  . Influenza,inj,Quad PF,6+ Mos 05/26/2013, 06/03/2014, 05/28/2015  . PFIZER(Purple Top)SARS-COV-2 Vaccination 10/01/2019, 10/22/2019, 06/15/2020  . Pneumococcal Conjugate-13  08/25/2013  . Pneumococcal Polysaccharide-23 09/11/2008  . Tdap 12/27/2010  . Zoster 07/10/2008  . Zoster Recombinat (Shingrix) 07/01/2018, 09/19/2018    Conditions to be addressed/monitored:  {CCM ASSESSMENT DZ OPTIONS:25047}  There are no care plans that you recently modified to display for this patient.    Medication Assistance: {MEDASSISTANCEINFO:25044}  Patient's preferred pharmacy is:  [place], [place] [address] Phone: [phone] Fax: [place], [place] [address] Phone: [phone] Fax: [phone]  Uses pill box? No - he feels he cannot divide out appropriately Pt endorses ***% compliance  We discussed: Benefits of medication synchronization, packaging and delivery as well as enhanced pharmacist oversight with [employer]. Patient decided to: Continue current medication management strategy  Follow Up:  {FOLLOWUP:24991}  Plan: The patient has been provided with contact information for the care management team and has been advised to call with any health related questions or concerns.   Beverly Milch, PharmD Clinical Pharmacist [facility] [phone]  Current Barriers:  . None   Pharmacist Clinical Goal(s):  [name] Over the next *** days, patient will {PHARMACYGOALCHOICES:24921} through collaboration with PharmD and provider.  . ***  Interventions: . 1:1 collaboration with [name], NP regarding development and update of comprehensive plan of care as evidenced by provider attestation and co-signature . Inter-disciplinary care team collaboration (see longitudinal plan of care) . Comprehensive medication review performed; medication list updated in electronic medical record  Hypertension (BP goal <130/80) -controlled -Current treatment: . Hydralazine 4m  bid . Losartan 1057mqd -Medications previously tried: metoprolol -Current home readings: *** -Current dietary habits: *** -Current exercise habits: *** -{ACTIONS;DENIES/REPORTS:21021675::"Denies"} hypotensive/hypertensive symptoms -Educated on {CCM BP Counseling:25124} -Counseled to monitor BP at home ***, document, and provide log at future appointments -{CCMPHARMDINTERVENTION:25122}  Hyperlipidemia: (LDL goal < 70) -controlled -Current treatment:  Rosuvastatin 78m478maily  Fenofibrate 1478m48mily  Fish Oil 1200mg39mly  Nitroglycerin 0.4mg a1meeded -Medications previously tried: pravastatin (muscle cramps), simvastatin (itxn with gemfibrozil) -Current dietary patterns: *** -Current exercise habits: *** -Educated on {CCM HLD Counseling:25126}  -{CCMPHARMDINTERVENTION:25122}  Hypothyroidism (Goal: Maintain TSH) -controlled -Current treatment   Levothyroxine 150mcg 54my, except on Sundays take 778mcg -51mcations  previously tried: None -{CCMPHARMDINTERVENTION:25122}  Neuropathy (Goal: Minimize symptoms) -controlled -Current treatment   Gabapentin 315m 2-3 times per day as needed  (only uses once daily HS) -Medications previously tried: None  -{CCMPHARMDINTERVENTION:25122}  OAB (Goal: Minimize symptoms) -{CHL Controlled/Uncontrolled:[phone]} -Current treatment  . Myrbetriq 2100m-Medications previously tried: None noted -{CCMPHARMDINTERVENTION:25122}   Patient Goals/Self-Care Activities . Over the next *** days, patient will:  - {pharmacypatientgoals:24919}  Follow Up Plan: {CM FOLLOW UP PLDKEU:99068}

## 2020-10-19 ENCOUNTER — Other Ambulatory Visit: Payer: Self-pay

## 2020-10-19 ENCOUNTER — Ambulatory Visit: Payer: Medicare HMO | Admitting: Rehabilitative and Restorative Service Providers"

## 2020-10-19 DIAGNOSIS — M6281 Muscle weakness (generalized): Secondary | ICD-10-CM | POA: Diagnosis not present

## 2020-10-19 DIAGNOSIS — N3941 Urge incontinence: Secondary | ICD-10-CM | POA: Diagnosis not present

## 2020-10-19 DIAGNOSIS — R2689 Other abnormalities of gait and mobility: Secondary | ICD-10-CM | POA: Diagnosis not present

## 2020-10-19 DIAGNOSIS — R3915 Urgency of urination: Secondary | ICD-10-CM | POA: Diagnosis not present

## 2020-10-19 DIAGNOSIS — R29818 Other symptoms and signs involving the nervous system: Secondary | ICD-10-CM

## 2020-10-19 NOTE — Therapy (Signed)
[place], [place], [address] Phone: [phone]   Fax:  [phone]  Physical Therapy Treatment  Patient Details  Name: [name] Pratt MRN: [id number] Date of Birth: [birth year] Referring Provider (PT): [name], MD   Encounter Date: 10/19/2020   PT End of Session - 10/19/20 1251    Visit Number 16    Number of Visits 20    Date for PT Re-Evaluation 10/27/20    Authorization Type [name]  *12 visits from 1/5-2/16/22    Authorization - Visit Number 9    Authorization - Number of Visits 12    Progress Note Due on Visit 20    PT Start Time 1022    PT Stop Time 1102    PT Time Calculation (min) 40 min    Equipment Utilized During Treatment Gait belt    Activity Tolerance Patient tolerated treatment well    Behavior During Therapy [facility] for tasks assessed/performed           Past Medical History:  Diagnosis Date  . Aortic atherosclerosis ([place]) 03/05/2020  . Arthritis   . Cancer ([place])    skin cancer  . Carotid stenosis    a. Carotid U/S 0/53: LICA < 97%, RICA 67-34%;  b.  Carotid U/S 1/93:  RICA 79-02%; LICA 4-09%; f/u 1 year  . Chest pain     Myoview in 2008 was normal.  Echo 7/09: EF 60%, normal wall motion, mild LVH, mild LAE.  [name] Complication of anesthesia    "stomach does not wake up"  . COPD (chronic obstructive pulmonary disease) ([place])   . Emphysema, unspecified ([place]) 03/05/2020  . GERD (gastroesophageal reflux disease)   . Gout   . History of chicken pox   . History of hemorrhoids   . History of kidney stones   . Hyperlipidemia    under control  . Hypertension    under control  . Hypothyroidism   . Ileus ([place])   . Inguinal hernia   . OSA (obstructive sleep apnea)   . Pancreatitis   . PONV (postoperative nausea and vomiting)   . Rosacea   . 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    Past Surgical History:  Procedure  Laterality Date  . ABDOMINAL HERNIA REPAIR  07/15/12   Dr [name]  . ANTERIOR CERVICAL DECOMP/DISCECTOMY FUSION N/A 07/09/2020   Procedure: ANTERIOR CERVICAL DECOMPRESSION AND FUSION CERVICAL THREE-FOUR.;  Surgeon: [name], MD;  Location: [place];  Service: Neurosurgery;  Laterality: N/A;  anterior  . BROW LIFT  05/07/01  . CYSTOSCOPY WITH URETEROSCOPY AND STENT PLACEMENT Right 01/28/2014   Procedure: CYSTOSCOPY WITH URETEROSCOPY, BASKET RETRIVAL AND  STENT PLACEMENT;  Surgeon: [name], MD;  [facility];  Service: Urology;  Laterality: Right;  . epidural injections     multiple procedures  . [place]   multiple times  . EYE SURGERY Bilateral 03/25/10, 2012   cataract removal  . HEMORRHOID SURGERY  2014  . HIATAL HERNIA REPAIR  12/21/93  . HOLMIUM LASER APPLICATION Right 7/35/3299   Procedure: HOLMIUM LASER APPLICATION;  Surgeon: [name], MD;  [facility];  Service: Urology;  Laterality: Right;  . INGUINAL HERNIA REPAIR Right 07/15/12   Dr [name], x2  . JOINT REPLACEMENT  07/25/04   right knee  . JOINT REPLACEMENT  07/13/10   left knee  . KNEE SURGERY  [place]  09/20/06   with intraoperative cholangiogram and right inguinal herniorrhaphy with mesh   . LIGAMENT REPAIR Left 07/2013   shoulder  . LITHOTRIPSY     x2  . NASAL SEPTUM SURGERY  1975  . REFRACTIVE SURGERY Bilateral   . 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 SKIN CANCER DESTRUCTION     nose, ear  . TONSILLECTOMY  as child  . TOTAL HIP ARTHROPLASTY Left   . URETHRAL DILATION  1991   [name]    There were no vitals filed for this visit.   Subjective Assessment - 10/19/20 1026    Subjective The patient reports weakness worse in the morning is worse, but he is walking better after he gets moving.  The patinet had a fall last week due to changing the rug in the house and it was thicker.  He was able to catch himself on his hands.    Pertinent History  neuropathy, HTN, lumbar DDD, hypothyroidism, carotid stenosis    Patient Stated Goals "I want my leg to get better and walk without limping."    Currently in Pain? No/denies              [facility] PT Assessment - 10/19/20 1027      Assessment   Medical Diagnosis s/p cervical myelopathy, gait training focus only    Referring Provider (PT) [name], MD    Onset Date/Surgical Date 07/09/20    Hand Dominance Right                         [facility] Adult PT Treatment/Exercise - 10/19/20 1028      Ambulation/Gait   Ambulation/Gait Yes    Ambulation/Gait Assistance 6: Modified independent (Device/Increase time)    Ambulation Distance (Feet) 300 Feet    Assistive device None    Gait velocity 2.60 ft/sec    Stairs Yes    Stairs Assistance 6: Modified independent (Device/Increase time)    Stair Management Technique Alternating pattern;One rail Right    Number of Stairs 12      Self-Care   Self-Care Other Self-Care Comments    Other Self-Care Comments  discussed continuing deficits-- the patient notes dec'd fine motor skills; PT's order is more for gait/mobility activities.  We discussed practicing at home challenging tasks including cursive writing, picking up small objects (pill size), picking up paper clips, working on fine motor tasks.      Neuro Re-ed    Neuro Re-ed Details  Stepping over and sideways near canes for obstables with CGA with occasional LOB.  Trampoline standing with wide to narrow standing and single leg stance.      Exercises   Exercises Knee/Hip;Lumbar      Lumbar Exercises: Stretches   Passive Hamstring Stretch Right;Left;1 rep;30 seconds    Other Lumbar Stretch Exercise lumbar rotation R and L x 30 second holds x 2 reps, quadriped toe extension stretch and tall kneeling hip stretching      Lumbar Exercises: Supine   Bridge with March 5 reps      Lumbar Exercises: Quadruped   Opposite Arm/Leg Raise Right arm/Left leg;Left arm/Right leg;10 reps     Other Quadruped Lumbar Exercises up to tall kneeling with heel sitting                       PT Long Term Goals - 10/19/20 1032      PT  LONG TERM GOAL #1   Title The patient will be indep with HEP.    Time 6    Period Weeks    Status On-going      PT LONG TERM GOAL #2   Title The patient will improve R hip abduction to 4/5.    Time 6    Period Weeks    Status Partially Met      PT LONG TERM GOAL #3   Title The patient will improve gait speed from 2.02 ft/sec to > or equal to 2.62 ft/sec to demo improved community moiblity.    Baseline up to 2.6 ft/sec    Time 6    Period Weeks    Status Partially Met      PT LONG TERM GOAL #4   Title The patient will maintain single leg stance x 5 seconds bilat.    Baseline L leg 10 seconds, R leg 10 seconds on 10/12/20    Time 6    Period Weeks    Status Achieved      PT LONG TERM GOAL #5   Title The patient will report dec'd low back pain to 2/10.    Time 6    Period Weeks    Status Achieved                 Plan - 10/19/20 1254    Clinical Impression Statement The patient has partially met LTGs.  PT has one further scheduled visit to work on HEP review, potentially add in some general strengthening and bicipital stretches (PT recommended he get clearance from MD on all UE activities).  He has a gym set up in home and is progressing well with gait and mobility.  We discussed some ongoing concerns for foot catching during gait due to toe drag, balance and UE coordination.    PT Treatment/Interventions ADLs/Self Care Home Management;Gait training;Stair training;Functional mobility training;Therapeutic activities;Therapeutic exercise;Balance training;Neuromuscular re-education;Manual techniques;Patient/family education;Taping;Electrical Stimulation;Orthotic Fit/Training    PT Next Visit Plan check LTGs, work on HEP (potentially add UE general activities for post d/c).  Plan to d/c and have patient continue HEP and general  conditioning.    PT Home Exercise Plan Access Code: [email or web address]    Consulted and Agree with Plan of Care Patient           Patient will benefit from skilled therapeutic intervention in order to improve the following deficits and impairments:     Visit Diagnosis: Muscle weakness (generalized)  Other abnormalities of gait and mobility  Other symptoms and signs involving the nervous system     Problem List Patient Active Problem List   Diagnosis Date Noted  . Cystoid macular edema of both eyes 07/28/2020  . Status post cervical spinal fusion 07/09/2020  . History of total bilateral knee replacement 07/08/2020  . Aortic atherosclerosis ([place]) 03/05/2020  . Emphysema, unspecified ([place]) 03/05/2020  . SIRS (systemic inflammatory response syndrome) ([place]) 03/04/2020  . Heme positive stool 02/29/2020  . Leg cramps 11/13/2019  . Tibialis anterior tendon tear, nontraumatic 11/13/2019  . 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 CPAP (continuous positive airway pressure) dependence 03/25/2018  . 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 Acute idiopathic gout of hand 03/25/2018  . Erectile dysfunction 06/20/2017  . 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 Peripheral neuropathy 06/19/2016  . Hearing loss 02/02/2016  . 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 Allergic rhinitis 02/02/2016  . Atypical chest pain  11/24/2015  . Preventative health care 11/24/2015  . Onychomycosis 06/30/2015  . Degenerative disc disease, lumbar 06/30/2015  . Other allergic rhinitis 02/18/2015  . Deviated nasal septum 02/18/2015  . OSA on CPAP 02/18/2015  . RLS (restless legs syndrome) 02/18/2015  . GERD (gastroesophageal reflux disease) 09/18/2014  . Hyperglycemia 03/03/2014  . Sleep apnea with use of continuous positive airway pressure (CPAP) 07/15/2013  . Carotid stenosis 02/24/2013  . Nonspecific abnormal electrocardiogram (ECG) (EKG) 01/06/2013  . DJD  (degenerative joint disease) of hip 12/24/2012  . Lower GI bleed 12/12/2012  . Dyspnea 06/12/2011  . Hypothyroidism 05/13/2009  . HYPERCHOLESTEROLEMIA 05/13/2009  . Hyperlipidemia 05/13/2009  . 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  . Essential hypertension 05/13/2009  . INGUINAL HERNIA 05/13/2009  . HIATAL HERNIA 05/13/2009  . NEPHROLITHIASIS 05/13/2009  . ROSACEA 05/13/2009    Taggert Bozzi, PT 10/19/2020, 12:57 PM  [facility] [place], [place], [address] Phone: [phone]   Fax:  [phone]  Name: [name] Pratt MRN: [id number] Date of Birth: 1940/11/04

## 2020-10-21 DIAGNOSIS — I10 Essential (primary) hypertension: Secondary | ICD-10-CM | POA: Diagnosis not present

## 2020-10-21 DIAGNOSIS — M545 Low back pain, unspecified: Secondary | ICD-10-CM | POA: Insufficient documentation

## 2020-10-21 DIAGNOSIS — G959 Disease of spinal cord, unspecified: Secondary | ICD-10-CM | POA: Diagnosis not present

## 2020-10-21 DIAGNOSIS — M5440 Lumbago with sciatica, unspecified side: Secondary | ICD-10-CM

## 2020-10-21 HISTORY — DX: Lumbago with sciatica, unspecified side: M54.40

## 2020-10-22 ENCOUNTER — Ambulatory Visit (INDEPENDENT_AMBULATORY_CARE_PROVIDER_SITE_OTHER): Payer: Medicare HMO | Admitting: Rehabilitative and Restorative Service Providers"

## 2020-10-22 ENCOUNTER — Other Ambulatory Visit: Payer: Self-pay

## 2020-10-22 DIAGNOSIS — R2689 Other abnormalities of gait and mobility: Secondary | ICD-10-CM

## 2020-10-22 DIAGNOSIS — M6281 Muscle weakness (generalized): Secondary | ICD-10-CM

## 2020-10-22 DIAGNOSIS — R29818 Other symptoms and signs involving the nervous system: Secondary | ICD-10-CM | POA: Diagnosis not present

## 2020-10-22 NOTE — Therapy (Signed)
[place], [place], [address] Phone: [phone]   Fax:  [phone]  Physical Therapy Treatment  Patient Details  Name: [name] Pratt MRN: [id number] Date of Birth: Oct 25, 1940 Referring Provider (PT): [name], MD   Encounter Date: 10/22/2020   PT End of Session - 10/22/20 1200    Visit Number 17    Number of Visits 20    Date for PT Re-Evaluation 10/27/20    Authorization Type [name]  *12 visits from 1/5-2/16/22    Authorization - Visit Number 10    Authorization - Number of Visits 12    Progress Note Due on Visit 20    PT Start Time 1146    PT Stop Time 1229    PT Time Calculation (min) 43 min    Equipment Utilized During Treatment Gait belt    Activity Tolerance Patient tolerated treatment well    Behavior During Therapy [facility] for tasks assessed/performed           Past Medical History:  Diagnosis Date  . Aortic atherosclerosis ([place]) 03/05/2020  . Arthritis   . Cancer ([place])    skin cancer  . Carotid stenosis    a. Carotid U/S 0/21: LICA < 11%, RICA 55-20%;  b.  Carotid U/S 8/02:  RICA 23-36%; LICA 1-22%; f/u 1 year  . Chest pain     Myoview in 2008 was normal.  Echo 7/09: EF 60%, normal wall motion, mild LVH, mild LAE.  [name] Complication of anesthesia    "stomach does not wake up"  . COPD (chronic obstructive pulmonary disease) ([place])   . Emphysema, unspecified ([place]) 03/05/2020  . GERD (gastroesophageal reflux disease)   . Gout   . History of chicken pox   . History of hemorrhoids   . History of kidney stones   . Hyperlipidemia    under control  . Hypertension    under control  . Hypothyroidism   . Ileus ([place])   . Inguinal hernia   . OSA (obstructive sleep apnea)   . Pancreatitis   . PONV (postoperative nausea and vomiting)   . Rosacea   . 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    Past Surgical History:  Procedure  Laterality Date  . ABDOMINAL HERNIA REPAIR  07/15/12   Dr [name]  . ANTERIOR CERVICAL DECOMP/DISCECTOMY FUSION N/A 07/09/2020   Procedure: ANTERIOR CERVICAL DECOMPRESSION AND FUSION CERVICAL THREE-FOUR.;  Surgeon: [name], MD;  Location: [place];  Service: Neurosurgery;  Laterality: N/A;  anterior  . BROW LIFT  05/07/01  . CYSTOSCOPY WITH URETEROSCOPY AND STENT PLACEMENT Right 01/28/2014   Procedure: CYSTOSCOPY WITH URETEROSCOPY, BASKET RETRIVAL AND  STENT PLACEMENT;  Surgeon: [name], MD;  [facility];  Service: Urology;  Laterality: Right;  . epidural injections     multiple procedures  . [place]   multiple times  . EYE SURGERY Bilateral 03/25/10, 2012   cataract removal  . HEMORRHOID SURGERY  2014  . HIATAL HERNIA REPAIR  12/21/93  . HOLMIUM LASER APPLICATION Right 4/49/7530   Procedure: HOLMIUM LASER APPLICATION;  Surgeon: [name], MD;  [facility];  Service: Urology;  Laterality: Right;  . INGUINAL HERNIA REPAIR Right 07/15/12   Dr [name], x2  . JOINT REPLACEMENT  07/25/04   right knee  . JOINT REPLACEMENT  07/13/10   left knee  . KNEE SURGERY  [place]  09/20/06   with intraoperative cholangiogram and right inguinal herniorrhaphy with mesh   . LIGAMENT REPAIR Left 07/2013   shoulder  . LITHOTRIPSY     x2  . NASAL SEPTUM SURGERY  1975  . REFRACTIVE SURGERY Bilateral   . 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 SKIN CANCER DESTRUCTION     nose, ear  . TONSILLECTOMY  as child  . TOTAL HIP ARTHROPLASTY Left   . URETHRAL DILATION  1991   [name]    There were no vitals filed for this visit.   Subjective Assessment - 10/22/20 1156    Subjective The patient reports that he had a bladder accident this morning due to waiting too long to go.  He reports he is weaker today after standing for hours yesterday working on cutting new boards for the base of 4 chairs.  He is extremely sore today and fell over with  a stool this morning when trying to donn compression hose.  He saw surgeon yesterday and plans to have an MRI due to R cervical pain.    Pertinent History neuropathy, HTN, lumbar DDD, hypothyroidism, carotid stenosis    Patient Stated Goals "I want my leg to get better and walk without limping."    Currently in Pain? No/denies   was in pain lastl night in posterior aspect of legs after standing x hours.             [facility] PT Assessment - 10/22/20 1204      Assessment   Medical Diagnosis s/p cervical myelopathy, gait training focus only    Referring Provider (PT) [name], MD    Onset Date/Surgical Date 07/09/20      Strength   Overall Strength Comments prone hip extension is 3/5      Standardized Balance Assessment   Standardized Balance Assessment Berg Balance Test      Berg Balance Test   Sit to Stand Able to stand without using hands and stabilize independently    Standing Unsupported Able to stand safely 2 minutes    Sitting with Back Unsupported but Feet Supported on Floor or Stool Able to sit safely and securely 2 minutes    Stand to Sit Sits safely with minimal use of hands    Transfers Able to transfer safely, minor use of hands    Standing Unsupported with Eyes Closed Able to stand 10 seconds safely    Standing Unsupported with Feet Together Able to place feet together independently and stand 1 minute safely    From Standing, Reach Forward with Outstretched Arm Can reach forward >12 cm safely (5")    From Standing Position, Pick up Object from Floor Able to pick up shoe safely and easily    From Standing Position, Turn to Look Behind Over each Shoulder Turn sideways only but maintains balance    Turn 360 Degrees Able to turn 360 degrees safely but slowly    Standing Unsupported, Alternately Place Feet on Step/Stool Able to complete 4 steps without aid or supervision    Standing Unsupported, One Foot in Front Able to plae foot ahead of the other independently and hold 30  seconds    Standing on One Leg Able to lift leg independently and hold equal to or more than 3 seconds    Total Score 46    Berg comment: 46/56  [facility] Adult PT Treatment/Exercise - 10/22/20 1204      Ambulation/Gait   Ambulation/Gait Yes    Ambulation/Gait Assistance 5: Supervision    Ambulation/Gait Assistance Details R LE is weak today with patient performing R circumduction.  He has has dec'd R foot clearance and maintains knee more extended during gait    Gait velocity 2.29 ft/sec      Neuro Re-ed    Neuro Re-ed Details  3 seconds R side, 5 seconds L side for SLS activities,  backwards walking with close supervision; compliant surface standing      Exercises   Exercises Knee/Hip;Lumbar      Lumbar Exercises: Stretches   Active Hamstring Stretch Right;Left;1 rep;30 seconds      Lumbar Exercises: Aerobic   Nustep L 5 x 4 minutes LEs only for warm up      Lumbar Exercises: Quadruped   Straight Leg Raise 10 reps    Opposite Arm/Leg Raise Right arm/Left leg;Left arm/Right leg;5 reps      Knee/Hip Exercises: Standing   Wall Squat 1 set;10 reps    SLS dec'd control with SLS today      Knee/Hip Exercises: Prone   Hamstring Curl 1 set;10 reps    Hamstring Curl Limitations bilaterally    Hip Extension Strengthening;Left;Right;10 reps                       PT Long Term Goals - 10/19/20 1032      PT LONG TERM GOAL #1   Title The patient will be indep with HEP.    Time 6    Period Weeks    Status On-going      PT LONG TERM GOAL #2   Title The patient will improve R hip abduction to 4/5.    Time 6    Period Weeks    Status Partially Met      PT LONG TERM GOAL #3   Title The patient will improve gait speed from 2.02 ft/sec to > or equal to 2.62 ft/sec to demo improved community moiblity.    Baseline up to 2.6 ft/sec    Time 6    Period Weeks    Status Partially Met      PT LONG TERM GOAL #4   Title The patient will  maintain single leg stance x 5 seconds bilat.    Baseline L leg 10 seconds, R leg 10 seconds on 10/12/20    Time 6    Period Weeks    Status Achieved      PT LONG TERM GOAL #5   Title The patient will report dec'd low back pain to 2/10.    Time 6    Period Weeks    Status Achieved                 Plan - 10/22/20 1311    Clinical Impression Statement The patient arrives today with a decline in his status since Tuesday.  He stood x 3-4 hours yesterday working on a home project yesterday and has increased tightness and pain today.  PT and patient discussed checking goals next visit and continuing with emphasis on endurance x 2-3 more weeks.  Plan to see how patient is after recovering from soreness and will renew asking [facility] for more visits if needed.    PT Treatment/Interventions ADLs/Self Care Home Management;Gait training;Stair training;Functional mobility training;Therapeutic activities;Therapeutic exercise;Balance training;Neuromuscular re-education;Manual techniques;Patient/family education;Taping;Electrical Stimulation;Orthotic Fit/Training    PT Next Visit  Plan check LTGs, work on HEP (potentially add UE general activities for post d/c).  Plan to discuss d/c versus renewal with dec'd frequency-- will need to update [employer] to continue.    PT Home Exercise Plan Access Code: [email or web address]    Consulted and Agree with Plan of Care Patient           Patient will benefit from skilled therapeutic intervention in order to improve the following deficits and impairments:     Visit Diagnosis: Muscle weakness (generalized)  Other abnormalities of gait and mobility  Other symptoms and signs involving the nervous system     Problem List Patient Active Problem List   Diagnosis Date Noted  . Cystoid macular edema of both eyes 07/28/2020  . Status post cervical spinal fusion 07/09/2020  . History of total bilateral knee replacement 07/08/2020  . Aortic atherosclerosis ([place]) 03/05/2020   . Emphysema, unspecified ([place]) 03/05/2020  . SIRS (systemic inflammatory response syndrome) ([place]) 03/04/2020  . Heme positive stool 02/29/2020  . Leg cramps 11/13/2019  . Tibialis anterior tendon tear, nontraumatic 11/13/2019  . 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 CPAP (continuous positive airway pressure) dependence 03/25/2018  . 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 Acute idiopathic gout of hand 03/25/2018  . Erectile dysfunction 06/20/2017  . 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 Peripheral neuropathy 06/19/2016  . Hearing loss 02/02/2016  . 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 Allergic rhinitis 02/02/2016  . Atypical chest pain 11/24/2015  . Preventative health care 11/24/2015  . Onychomycosis 06/30/2015  . Degenerative disc disease, lumbar 06/30/2015  . Other allergic rhinitis 02/18/2015  . Deviated nasal septum 02/18/2015  . OSA on CPAP 02/18/2015  . RLS (restless legs syndrome) 02/18/2015  . GERD (gastroesophageal reflux disease) 09/18/2014  . Hyperglycemia 03/03/2014  . Sleep apnea with use of continuous positive airway pressure (CPAP) 07/15/2013  . Carotid stenosis 02/24/2013  . Nonspecific abnormal electrocardiogram (ECG) (EKG) 01/06/2013  . DJD (degenerative joint disease) of hip 12/24/2012  . Lower GI bleed 12/12/2012  . Dyspnea 06/12/2011  . Hypothyroidism 05/13/2009  . HYPERCHOLESTEROLEMIA 05/13/2009  . Hyperlipidemia 05/13/2009  . 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  . Essential hypertension 05/13/2009  . INGUINAL HERNIA 05/13/2009  . HIATAL HERNIA 05/13/2009  . NEPHROLITHIASIS 05/13/2009  . ROSACEA 05/13/2009    Gravity, PT 10/22/2020, 1:17 PM  [facility] [name], [place], [address] Phone: [phone]   Fax:  [phone]  Name: [name] Pratt MRN: [id number] Date of Birth: 07-30-1941

## 2020-10-25 ENCOUNTER — Encounter: Payer: Medicare HMO | Admitting: Rehabilitative and Restorative Service Providers"

## 2020-10-26 DIAGNOSIS — R3915 Urgency of urination: Secondary | ICD-10-CM | POA: Diagnosis not present

## 2020-10-29 ENCOUNTER — Other Ambulatory Visit: Payer: Self-pay | Admitting: Family

## 2020-11-01 ENCOUNTER — Encounter: Payer: Self-pay | Admitting: Rehabilitative and Restorative Service Providers"

## 2020-11-01 ENCOUNTER — Other Ambulatory Visit: Payer: Self-pay

## 2020-11-01 ENCOUNTER — Ambulatory Visit: Payer: Medicare HMO | Admitting: Rehabilitative and Restorative Service Providers"

## 2020-11-01 DIAGNOSIS — R2689 Other abnormalities of gait and mobility: Secondary | ICD-10-CM | POA: Diagnosis not present

## 2020-11-01 DIAGNOSIS — M6281 Muscle weakness (generalized): Secondary | ICD-10-CM

## 2020-11-01 DIAGNOSIS — R29818 Other symptoms and signs involving the nervous system: Secondary | ICD-10-CM | POA: Diagnosis not present

## 2020-11-01 DIAGNOSIS — R2681 Unsteadiness on feet: Secondary | ICD-10-CM | POA: Diagnosis not present

## 2020-11-01 NOTE — Therapy (Signed)
[name], [place], [address] Phone: [phone]   Fax:  [phone]  Physical Therapy Treatment and Renewal Summary  Patient Details  Name: [name] Pratt MRN: [id number] Date of Birth: [birth year] Referring Provider (PT): [name], MD   Encounter Date: 11/01/2020   PT End of Session - 11/01/20 0943    Visit Number 18    Number of Visits 20    Date for PT Re-Evaluation 12/01/20    Authorization Type [name]  *12 visits from 1/5-2/16/22    Authorization - Visit Number 11    Authorization - Number of Visits 12    Progress Note Due on Visit 20    PT Start Time 0945    PT Stop Time 1025    PT Time Calculation (min) 40 min    Equipment Utilized During Treatment Gait belt    Activity Tolerance Patient tolerated treatment well    Behavior During Therapy [facility] for tasks assessed/performed           Past Medical History:  Diagnosis Date  . Aortic atherosclerosis ([place]) 03/05/2020  . Arthritis   . Cancer ([place])    skin cancer  . Carotid stenosis    a. Carotid U/S 8/09: LICA < 98%, RICA 33-82%;  b.  Carotid U/S 5/05:  RICA 39-76%; LICA 7-34%; f/u 1 year  . Chest pain     Myoview in 2008 was normal.  Echo 7/09: EF 60%, normal wall motion, mild LVH, mild LAE.  [name] Complication of anesthesia    "stomach does not wake up"  . COPD (chronic obstructive pulmonary disease) ([place])   . Emphysema, unspecified ([place]) 03/05/2020  . GERD (gastroesophageal reflux disease)   . Gout   . History of chicken pox   . History of hemorrhoids   . History of kidney stones   . Hyperlipidemia    under control  . Hypertension    under control  . Hypothyroidism   . Ileus ([place])   . Inguinal hernia   . OSA (obstructive sleep apnea)   . Pancreatitis   . PONV (postoperative nausea and vomiting)   . Rosacea   . 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    Past Surgical  History:  Procedure Laterality Date  . ABDOMINAL HERNIA REPAIR  07/15/12   Dr [name]  . ANTERIOR CERVICAL DECOMP/DISCECTOMY FUSION N/A 07/09/2020   Procedure: ANTERIOR CERVICAL DECOMPRESSION AND FUSION CERVICAL THREE-FOUR.;  Surgeon: [name], MD;  Location: [facility];  Service: Neurosurgery;  Laterality: N/A;  anterior  . BROW LIFT  05/07/01  . CYSTOSCOPY WITH URETEROSCOPY AND STENT PLACEMENT Right 01/28/2014   Procedure: CYSTOSCOPY WITH URETEROSCOPY, BASKET RETRIVAL AND  STENT PLACEMENT;  Surgeon: [name], MD;  Location[facility];  Service: Urology;  Laterality: Right;  . epidural injections     multiple procedures  . [place]   multiple times  . EYE SURGERY Bilateral 03/25/10, 2012   cataract removal  . HEMORRHOID SURGERY  2014  . HIATAL HERNIA REPAIR  12/21/93  . HOLMIUM LASER APPLICATION Right 1/93/7902   Procedure: HOLMIUM LASER APPLICATION;  Surgeon: [name], MD;  [facility];  Service: Urology;  Laterality: Right;  . INGUINAL HERNIA REPAIR Right 07/15/12   Dr [name], x2  . JOINT REPLACEMENT  07/25/04   right knee  . JOINT REPLACEMENT  07/13/10   left knee  .  KNEE SURGERY  1995  . LAPAROSCOPIC CHOLECYSTECTOMY  09/20/06   with intraoperative cholangiogram and right inguinal herniorrhaphy with mesh   . LIGAMENT REPAIR Left 07/2013   shoulder  . LITHOTRIPSY     x2  . NASAL SEPTUM SURGERY  1975  . REFRACTIVE SURGERY Bilateral   . 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 SKIN CANCER DESTRUCTION     nose, ear  . TONSILLECTOMY  as child  . TOTAL HIP ARTHROPLASTY Left   . URETHRAL DILATION  1991   [name]    There were no vitals filed for this visit.   Subjective Assessment - 11/01/20 0944    Subjective The patient reports he is limping more and this is making his low back hurt more.  He reports "same place it's always hurt", but worsened.   He is getting injections on Tuesday for the low back.  [name] cleaned his carpets on his steps yesterday  and has increased soreness today into the hips and back of his legs.    Pertinent History neuropathy, HTN, lumbar DDD, hypothyroidism, carotid stenosis    Patient Stated Goals "I want my leg to get better and walk without limping."    Currently in Pain? Yes    Pain Score 8     Pain Location Back    Pain Orientation Right;Lower    Pain Descriptors / Indicators Aching;Tightness;Sore    Pain Type Chronic pain    Pain Onset More than a month ago    Pain Frequency Intermittent              [facility] PT Assessment - 11/01/20 0954      Assessment   Medical Diagnosis s/p cervical myelopathy, gait training focus only    Referring Provider (PT) [name], MD    Onset Date/Surgical Date 07/09/20    Hand Dominance Right    Next MD Visit 10/2020                         [facility] Adult PT Treatment/Exercise - 11/01/20 0954      Ambulation/Gait   Ambulation/Gait Yes    Ambulation/Gait Assistance 6: Modified independent (Device/Increase time)    Ambulation/Gait Assistance Details R LE is weaker today with gait with leftl eaning.    Assistive device None      Exercises   Exercises Knee/Hip;Lumbar      Lumbar Exercises: Stretches   Active Hamstring Stretch Right;Left;1 rep;30 seconds    Passive Hamstring Stretch Right;Left;1 rep;30 seconds    Lower Trunk Rotation 5 reps    Piriformis Stretch Right;Left;1 rep;30 seconds      Lumbar Exercises: Standing   Other Standing Lumbar Exercises standing flexion at countertop and standing rotation with unilateral UE on countertop    Other Standing Lumbar Exercises Standing marching in place      Lumbar Exercises: Supine   Bent Knee Raise 5 reps    Bridge 10 reps      Lumbar Exercises: Sidelying   Hip Abduction 10 reps    Other Sidelying Lumbar Exercises hip hiking and depression x 10 reps    Other Sidelying Lumbar Exercises reverse clam x 12 reps                       PT Long Term Goals - 11/01/20 1014      PT LONG  TERM GOAL #1   Title The patient will be indep with HEP.  Time 6    Period Weeks    Status Achieved      PT LONG TERM GOAL #2   Title The patient will improve R hip abduction to 4/5.    Time 6    Period Weeks    Status Partially Met      PT LONG TERM GOAL #3   Title The patient will improve gait speed from 2.02 ft/sec to > or equal to 2.62 ft/sec to demo improved community moiblity.    Baseline up to 2.6 ft/sec   Reduced to 2.2 ft/sec on 10/22/20 with a decline in status    Time 6    Period Weeks    Status Not Met      PT LONG TERM GOAL #4   Title The patient will maintain single leg stance x 5 seconds bilat.    Baseline L leg 10 seconds, R leg 10 seconds on 10/12/20    Time 6    Period Weeks    Status Achieved      PT LONG TERM GOAL #5   Title The patient will report dec'd low back pain to 2/10.    Time 6    Period Weeks    Status Achieved            UPDATED LONG TERM GOALS:  PT Long Term Goals - 11/01/20 1257      PT LONG TERM GOAL #1   Title The patient will be indep with progresison of HEP    Time 4    Period Weeks    Status Revised    Target Date 12/01/20      PT LONG TERM GOAL #2   Title The patient will report low back pain reduced from 8/10 to < or equal to 4/10.    Time 4    Period Weeks    Status New    Target Date 12/01/20      PT LONG TERM GOAL #3   Title The patient will improve gait speed from 2.02 ft/sec to > or equal to 2.62 ft/sec to demo improved community moiblity.    Baseline up to 2.6 ft/sec   Reduced to 2.2 ft/sec on 10/22/20 with a decline in status    Time 4    Period Weeks    Status Revised    Target Date 12/01/20                Plan - 11/01/20 1025    Clinical Impression Statement The patient had a decline in status from appt on 2/8 to 10/22/20.  He had increased activity between those dates repairing some dining chairs and standing for hours.  He continues with this decline with greater hip weakness, and notes more  difficulty walking.  He feels more fatigue.  The patient is scheduled to undergo injections next week.  PT and patient discussed plan of care.  Patient notes concern about balance and worsening weakness.  He is having another MRI and will see [name] in clinic soon to discuss.  PT to continue our current plan with modified goals due to a change in status.    PT Frequency 2x / week    PT Duration 4 weeks    PT Treatment/Interventions ADLs/Self Care Home Management;Gait training;Stair training;Functional mobility training;Therapeutic activities;Therapeutic exercise;Balance training;Neuromuscular re-education;Manual techniques;Patient/family education;Taping;Electrical Stimulation;Orthotic Fit/Training    PT Next Visit Plan check LTGs, work on HEP (potentially add UE general activities for post d/c).  Plan to discuss d/c versus  renewal with dec'd frequency-- will need to update [employer] to continue.    PT Home Exercise Plan Access Code: [email or web address]    Consulted and Agree with Plan of Care Patient           Patient will benefit from skilled therapeutic intervention in order to improve the following deficits and impairments:  Abnormal gait,Impaired flexibility,Decreased strength,Difficulty walking,Decreased activity tolerance,Decreased balance,Impaired sensation  Visit Diagnosis: Muscle weakness (generalized)  Other abnormalities of gait and mobility  Other symptoms and signs involving the nervous system  Unsteadiness on feet     Problem List Patient Active Problem List   Diagnosis Date Noted  . Cystoid macular edema of both eyes 07/28/2020  . Status post cervical spinal fusion 07/09/2020  . History of total bilateral knee replacement 07/08/2020  . Aortic atherosclerosis ([place]) 03/05/2020  . Emphysema, unspecified ([place]) 03/05/2020  . SIRS (systemic inflammatory response syndrome) ([place]) 03/04/2020  . Heme positive stool 02/29/2020  . Leg cramps 11/13/2019  . Tibialis anterior tendon tear,  nontraumatic 11/13/2019  . 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 CPAP (continuous positive airway pressure) dependence 03/25/2018  . 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 Acute idiopathic gout of hand 03/25/2018  . Erectile dysfunction 06/20/2017  . 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 Peripheral neuropathy 06/19/2016  . Hearing loss 02/02/2016  . 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 Allergic rhinitis 02/02/2016  . Atypical chest pain 11/24/2015  . Preventative health care 11/24/2015  . Onychomycosis 06/30/2015  . Degenerative disc disease, lumbar 06/30/2015  . Other allergic rhinitis 02/18/2015  . Deviated nasal septum 02/18/2015  . OSA on CPAP 02/18/2015  . RLS (restless legs syndrome) 02/18/2015  . GERD (gastroesophageal reflux disease) 09/18/2014  . Hyperglycemia 03/03/2014  . Sleep apnea with use of continuous positive airway pressure (CPAP) 07/15/2013  . Carotid stenosis 02/24/2013  . Nonspecific abnormal electrocardiogram (ECG) (EKG) 01/06/2013  . DJD (degenerative joint disease) of hip 12/24/2012  . Lower GI bleed 12/12/2012  . Dyspnea 06/12/2011  . Hypothyroidism 05/13/2009  . HYPERCHOLESTEROLEMIA 05/13/2009  . Hyperlipidemia 05/13/2009  . 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  . Essential hypertension 05/13/2009  . INGUINAL HERNIA 05/13/2009  . HIATAL HERNIA 05/13/2009  . NEPHROLITHIASIS 05/13/2009  . ROSACEA 05/13/2009    Valli Randol , PT 11/01/2020, 12:55 PM  [facility] [place], [place], [address] Phone: [phone]   Fax:  [phone]  Name: [name] Pratt MRN: [id number] Date of Birth: 1941-07-15

## 2020-11-02 DIAGNOSIS — R3915 Urgency of urination: Secondary | ICD-10-CM | POA: Diagnosis not present

## 2020-11-02 DIAGNOSIS — M47816 Spondylosis without myelopathy or radiculopathy, lumbar region: Secondary | ICD-10-CM

## 2020-11-02 DIAGNOSIS — Z6825 Body mass index (BMI) 25.0-25.9, adult: Secondary | ICD-10-CM

## 2020-11-02 HISTORY — DX: Body mass index (BMI) 25.0-25.9, adult: Z68.25

## 2020-11-02 HISTORY — DX: Spondylosis without myelopathy or radiculopathy, lumbar region: M47.816

## 2020-11-03 ENCOUNTER — Encounter: Payer: Medicare HMO | Admitting: Physical Therapy

## 2020-11-03 DIAGNOSIS — M4802 Spinal stenosis, cervical region: Secondary | ICD-10-CM | POA: Diagnosis not present

## 2020-11-03 DIAGNOSIS — G959 Disease of spinal cord, unspecified: Secondary | ICD-10-CM | POA: Diagnosis not present

## 2020-11-03 DIAGNOSIS — M5021 Other cervical disc displacement,  high cervical region: Secondary | ICD-10-CM | POA: Diagnosis not present

## 2020-11-04 ENCOUNTER — Telehealth: Payer: Self-pay | Admitting: Family

## 2020-11-04 DIAGNOSIS — E039 Hypothyroidism, unspecified: Secondary | ICD-10-CM

## 2020-11-05 NOTE — Telephone Encounter (Signed)
Our records show that his hydralazine was stopped back in June.  Has he been taking this medication the whole time?

## 2020-11-09 DIAGNOSIS — N3941 Urge incontinence: Secondary | ICD-10-CM | POA: Diagnosis not present

## 2020-11-09 DIAGNOSIS — R3915 Urgency of urination: Secondary | ICD-10-CM | POA: Diagnosis not present

## 2020-11-10 ENCOUNTER — Encounter: Payer: Medicare HMO | Admitting: Rehabilitative and Restorative Service Providers"

## 2020-11-11 ENCOUNTER — Telehealth: Payer: Self-pay | Admitting: Family

## 2020-11-11 DIAGNOSIS — G959 Disease of spinal cord, unspecified: Secondary | ICD-10-CM | POA: Diagnosis not present

## 2020-11-11 NOTE — Telephone Encounter (Signed)
Caller: [facility]  Need rx for TRUE Metrix Blood Glucose Test Strip True metrix lancets  [facility] - [place], [place] Phone:  [phone]  Fax:  [phone]

## 2020-11-12 ENCOUNTER — Ambulatory Visit: Payer: Medicare HMO | Admitting: Rehabilitative and Restorative Service Providers"

## 2020-11-12 ENCOUNTER — Other Ambulatory Visit: Payer: Self-pay

## 2020-11-12 DIAGNOSIS — R2681 Unsteadiness on feet: Secondary | ICD-10-CM

## 2020-11-12 DIAGNOSIS — M6281 Muscle weakness (generalized): Secondary | ICD-10-CM

## 2020-11-12 DIAGNOSIS — R2689 Other abnormalities of gait and mobility: Secondary | ICD-10-CM

## 2020-11-12 DIAGNOSIS — R29818 Other symptoms and signs involving the nervous system: Secondary | ICD-10-CM

## 2020-11-12 MED ORDER — LANCETS MISC: 1.0000 | Freq: Two times a day (BID) | 3 refills | Status: DC

## 2020-11-12 MED ORDER — GLUCOSE BLOOD VI STRP: ORAL_STRIP | 12 refills | Status: DC

## 2020-11-12 NOTE — Telephone Encounter (Signed)
rx for TRUE matrix test strips and lancets sent to [facility]

## 2020-11-12 NOTE — Therapy (Signed)
[place], [place], [address] Phone: [phone]   Fax:  [phone]  Physical Therapy Treatment and Discharge Summary  Patient Details  Name: [name] Pratt MRN: [id number] Date of Birth: October 04, 1940 Referring Provider (PT): [name], MD   Encounter Date: 11/12/2020   PT End of Session - 11/12/20 1225    Visit Number 19    Number of Visits 26    Date for PT Re-Evaluation 12/01/20    Authorization Type [name]  *12 visits from 1/5-2/16/22    Authorization - Visit Number 2    Authorization - Number of Visits 8    Progress Note Due on Visit 20    PT Start Time 1144    PT Stop Time 1230    PT Time Calculation (min) 46 min    Equipment Utilized During Treatment Gait belt    Activity Tolerance Patient tolerated treatment well    Behavior During Therapy [facility] for tasks assessed/performed           Past Medical History:  Diagnosis Date  . Aortic atherosclerosis ([place]) 03/05/2020  . Arthritis   . Cancer ([place])    skin cancer  . Carotid stenosis    a. Carotid U/S 4/03: LICA < 75%, RICA 43-60%;  b.  Carotid U/S 6/77:  RICA 03-40%; LICA 3-52%; f/u 1 year  . Chest pain     Myoview in 2008 was normal.  Echo 7/09: EF 60%, normal wall motion, mild LVH, mild LAE.  [name] Complication of anesthesia    "stomach does not wake up"  . COPD (chronic obstructive pulmonary disease) ([place])   . Emphysema, unspecified ([place]) 03/05/2020  . GERD (gastroesophageal reflux disease)   . Gout   . History of chicken pox   . History of hemorrhoids   . History of kidney stones   . Hyperlipidemia    under control  . Hypertension    under control  . Hypothyroidism   . Ileus ([place])   . Inguinal hernia   . OSA (obstructive sleep apnea)   . Pancreatitis   . PONV (postoperative nausea and vomiting)   . Rosacea   . 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    Past Surgical  History:  Procedure Laterality Date  . ABDOMINAL HERNIA REPAIR  07/15/12   Dr [name]  . ANTERIOR CERVICAL DECOMP/DISCECTOMY FUSION N/A 07/09/2020   Procedure: ANTERIOR CERVICAL DECOMPRESSION AND FUSION CERVICAL THREE-FOUR.;  Surgeon: [name], MD;  Location: [facility];  Service: Neurosurgery;  Laterality: N/A;  anterior  . BROW LIFT  05/07/01  . CYSTOSCOPY WITH URETEROSCOPY AND STENT PLACEMENT Right 01/28/2014   Procedure: CYSTOSCOPY WITH URETEROSCOPY, BASKET RETRIVAL AND  STENT PLACEMENT;  Surgeon: [name], MD;  [facility];  Service: Urology;  Laterality: Right;  . epidural injections     multiple procedures  . [place]   multiple times  . EYE SURGERY Bilateral 03/25/10, 2012   cataract removal  . HEMORRHOID SURGERY  2014  . HIATAL HERNIA REPAIR  12/21/93  . HOLMIUM LASER APPLICATION Right 4/81/8590   Procedure: HOLMIUM LASER APPLICATION;  Surgeon: [name], MD;  [facility];  Service: Urology;  Laterality: Right;  . INGUINAL HERNIA REPAIR Right 07/15/12   Dr [name], x2  . JOINT REPLACEMENT  07/25/04   right knee  . JOINT REPLACEMENT  07/13/10   left knee  .  KNEE SURGERY  1995  . LAPAROSCOPIC CHOLECYSTECTOMY  09/20/06   with intraoperative cholangiogram and right inguinal herniorrhaphy with mesh   . LIGAMENT REPAIR Left 07/2013   shoulder  . LITHOTRIPSY     x2  . NASAL SEPTUM SURGERY  1975  . REFRACTIVE SURGERY Bilateral   . 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 SKIN CANCER DESTRUCTION     nose, ear  . TONSILLECTOMY  as child  . TOTAL HIP ARTHROPLASTY Left   . URETHRAL DILATION  1991   [name]    There were no vitals filed for this visit.   Subjective Assessment - 11/12/20 1152    Subjective The patient reports he underwent lumbar injection since our last visit.  He had initial relief but when returning to functional tasks, pain increased.  The patient reports he had a fall last Thursday and bruised his hip.  With PT, he initially  experienced improvement in knee control and strength, but now feels symptoms are worsening.    Pertinent History neuropathy, HTN, lumbar DDD, hypothyroidism, carotid stenosis    Patient Stated Goals "I want my leg to get better and walk without limping."    Currently in Pain? Yes    Pain Score 3    at worst 8-9/10 when doing flexion based activities during IADLs.   Pain Location Back    Pain Descriptors / Indicators Aching    Pain Type Chronic pain    Pain Onset More than a month ago    Pain Frequency Intermittent    Aggravating Factors  limping irritates back, flexion activities for household chores    Pain Relieving Factors movement              [facility] PT Assessment - 11/12/20 1159      Assessment   Medical Diagnosis s/p cervical myelopathy, gait training focus only    Referring Provider (PT) [name], MD    Onset Date/Surgical Date 07/09/20    Hand Dominance Right                         [facility] Adult PT Treatment/Exercise - 11/12/20 1159      Ambulation/Gait   Ambulation/Gait Yes    Ambulation/Gait Assistance 6: Modified independent (Device/Increase time)    Ambulation/Gait Assistance Details R LE has knee recurvatum with stance p hase    Assistive device None;Straight cane      Self-Care   Self-Care Other Self-Care Comments    Other Self-Care Comments  home safety discussing concerns about patient being on step ladder for home tasks like painting.  Also discussed safety with taking trash cans to curb and fall prevention.  We trialed a SPC, but patient feels hand function makes it hard to grasp.  We discussed using dycem or coban to thicken handle and provide sensory feedback.      Exercises   Exercises Knee/Hip;Lumbar      Lumbar Exercises: Stretches   Active Hamstring Stretch Right;Left;1 rep;30 seconds    Piriformis Stretch Right;Left;1 rep;30 seconds    Other Lumbar Stretch Exercise hip adductor stretch      Lumbar Exercises: Aerobic   Nustep L5 x  5 minutes      Lumbar Exercises: Supine   Bridge 10 reps      Lumbar Exercises: Sidelying   Clam 15 reps    Hip Abduction 5 reps    Hip Abduction Limitations not able to do without using hip flexors, so  modified to clam                  PT Education - 11/12/20 1224    Education Details modified HEP and discussed home gym equipment    Person(s) Educated Patient    Methods Explanation;Demonstration;Handout    Comprehension Verbalized understanding;Returned demonstration               PT Long Term Goals - 11/12/20 1607      PT LONG TERM GOAL #1   Title The patient will be indep with progresison of HEP    Time 4    Period Weeks    Status Achieved      PT LONG TERM GOAL #2   Title The patient will report low back pain reduced from 8/10 to < or equal to 4/10.    Time 4    Period Weeks    Status Deferred      PT LONG TERM GOAL #3   Title The patient will improve gait speed from 2.02 ft/sec to > or equal to 2.62 ft/sec to demo improved community moiblity.    Baseline up to 2.6 ft/sec   Reduced to 2.2 ft/sec on 10/22/20 with a decline in status    Time 4    Period Weeks    Status Deferred                 Plan - 11/12/20 1619    Clinical Impression Statement Recert/re-eval was done at last session.  PT and patient discussed recent events of learning he is in need of further neck surgery due to spinal cord compression. He had a decline in status over the past month.  PT reviewed HEP, recommended changes to improve home safety, trialed a SPC and discussed ways to make handle adapted for better grip.  Patient notes he will undergo another surgery in upcoming weeks/months.    PT Treatment/Interventions ADLs/Self Care Home Management;Gait training;Stair training;Functional mobility training;Therapeutic activities;Therapeutic exercise;Balance training;Neuromuscular re-education;Manual techniques;Patient/family education;Taping;Electrical Stimulation;Orthotic  Fit/Training    PT Next Visit Plan discharge    PT Home Exercise Plan Access Code: [email or web address]    Consulted and Agree with Plan of Care Patient           Patient will benefit from skilled therapeutic intervention in order to improve the following deficits and impairments:     Visit Diagnosis: Muscle weakness (generalized)  Other abnormalities of gait and mobility  Other symptoms and signs involving the nervous system  Unsteadiness on feet     Problem List Patient Active Problem List   Diagnosis Date Noted  . Cystoid macular edema of both eyes 07/28/2020  . Status post cervical spinal fusion 07/09/2020  . History of total bilateral knee replacement 07/08/2020  . Aortic atherosclerosis ([place]) 03/05/2020  . Emphysema, unspecified ([place]) 03/05/2020  . SIRS (systemic inflammatory response syndrome) ([place]) 03/04/2020  . Heme positive stool 02/29/2020  . Leg cramps 11/13/2019  . Tibialis anterior tendon tear, nontraumatic 11/13/2019  . 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 CPAP (continuous positive airway pressure) dependence 03/25/2018  . 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 Acute idiopathic gout of hand 03/25/2018  . Erectile dysfunction 06/20/2017  . 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 Peripheral neuropathy 06/19/2016  . Hearing loss 02/02/2016  . 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 Allergic rhinitis 02/02/2016  . Atypical chest pain 11/24/2015  . Preventative health care 11/24/2015  . Onychomycosis 06/30/2015  .  Degenerative disc disease, lumbar 06/30/2015  . Other allergic rhinitis 02/18/2015  . Deviated nasal septum 02/18/2015  . OSA on CPAP 02/18/2015  . RLS (restless legs syndrome) 02/18/2015  . GERD (gastroesophageal reflux disease) 09/18/2014  . Hyperglycemia 03/03/2014  . Sleep apnea with use of continuous positive airway pressure (CPAP) 07/15/2013  . Carotid stenosis 02/24/2013  . Nonspecific  abnormal electrocardiogram (ECG) (EKG) 01/06/2013  . DJD (degenerative joint disease) of hip 12/24/2012  . Lower GI bleed 12/12/2012  . Dyspnea 06/12/2011  . Hypothyroidism 05/13/2009  . HYPERCHOLESTEROLEMIA 05/13/2009  . Hyperlipidemia 05/13/2009  . 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  . Essential hypertension 05/13/2009  . INGUINAL HERNIA 05/13/2009  . HIATAL HERNIA 05/13/2009  . NEPHROLITHIASIS 05/13/2009  . ROSACEA 05/13/2009    PHYSICAL THERAPY DISCHARGE SUMMARY  Visits from Start of Care: 19  Current functional level related to goals / functional outcomes: See above and last session for goal update   Remaining deficits: Worsening gait symptoms over last month- patient to undergo another cervical decompression surgery (posterior approach)   Education / Equipment: HEP, home safety, home modifications  Plan: Patient agrees to discharge.  Patient goals were not met. Patient is being discharged due to a change in medical status.  ?????          Thank you for the referral of this patient. [name], MPT   [place], [place] 11/12/2020, 4:22 PM  [facility] [place], [place], [address] Phone: [phone]   Fax:  [phone]  Name: MORY HERRMAN MRN: [id number] Date of Birth: Apr 20, 1941

## 2020-11-12 NOTE — Patient Instructions (Signed)
Access Code: [email or web address] URL: [email or web address] Date: 11/12/2020 Prepared by: [name]  Program Notes The total gym can replace the squat with chair touch.  You can use your bike (add low resistance) or try the elliptical as long as your knee does not pop back.   Exercises Squat with Chair Touch - 2 x daily - 7 x weekly - 1 sets - 10 reps Side Stepping with Counter Support - 1 x daily - 3 x weekly - 1 sets - 10 reps Clamshell - 2 x daily - 7 x weekly - 1 sets - 10 reps Supine Hamstring Stretch - 2 x daily - 7 x weekly - 1 sets - 2 reps - 30 seconds hold Beginner Bridge - 1 x daily - 7 x weekly - 2 sets - 10 reps Supine Butterfly Groin Stretch - 2 x daily - 7 x weekly - 1 sets - 3 reps - 30 seconds hold Supine Piriformis Stretch with Foot on Ground - 1 x daily - 7 x weekly - 1 sets - 2 reps - 30 sec hold

## 2020-11-12 NOTE — Telephone Encounter (Signed)
Patient advised the information he provided was sent to the provider and to bring all of his medications when he comes back.

## 2020-11-12 NOTE — Telephone Encounter (Signed)
Patient would like to know everything is straight he is taking Hydralazine 2 time a day

## 2020-11-16 ENCOUNTER — Other Ambulatory Visit: Payer: Self-pay | Admitting: Neurosurgery

## 2020-11-16 ENCOUNTER — Telehealth: Payer: Self-pay | Admitting: Pharmacist

## 2020-11-16 DIAGNOSIS — N3941 Urge incontinence: Secondary | ICD-10-CM | POA: Diagnosis not present

## 2020-11-16 DIAGNOSIS — R3915 Urgency of urination: Secondary | ICD-10-CM | POA: Diagnosis not present

## 2020-11-16 DIAGNOSIS — G4733 Obstructive sleep apnea (adult) (pediatric): Secondary | ICD-10-CM | POA: Diagnosis not present

## 2020-11-16 NOTE — Progress Notes (Addendum)
Chronic Care Management Pharmacy Assistant   Name: [name] Pratt  MRN: [id number] DOB: Feb 26, 1941  Reason for Encounter: Disease State For HTN.   Conditions to be addressed/monitored: Hypertension, Hyperlipidemia/Aortic Atherosclerosis, Hypothyroidism, Emphysema, Cervical Stenosis, Neuropathy, Cystoid Macular Edema, Overactive Bladder  Recent office visits:  09/29/20 Christia Reading, NP. For Hyperglycemia. STOPPED Mirabegron. 08/25/20 Ann Maki, NP. Labs drawn. CT ordered. No medication changes.  Recent consult visits: 09/23/19 Nephrology. Madelon Lips. No information given.  09/14/20 Urology Herrck, Viona Gilmore. No information given. 09/09/20 Neurosurgery Stevenson Clinch. Post Op Exam. Xrays preformed. 08/12/20 Neurosurgery Stevenson Clinch. Post Op exam. X-rays preformed.  08/04/20 Urology Curt Bears. Urinalysis preformed.  Hospital visits:  Medication Reconciliation was completed by comparing discharge summary, patients EMR and Pharmacy list, and upon discussion with patient.  Admitted to the hospital on 09/08/20 due to Concussion without loss of consciousness. Discharge date was 09/08/20. Discharged from [facility]. CT preformed.  New?Medications Started at [facility] Discharge:?? -None.  Medication Changes at Hospital Discharge: -None.  Medications Discontinued at Hospital Discharge: -None.  Medications that remain the same after Hospital Discharge:??  -All other medications will remain the same.    Medications: Outpatient Encounter Medications as of 11/16/2020  Medication Sig   acetaminophen (TYLENOL) 650 MG CR tablet Take 1,300 mg by mouth every 8 (eight) hours as needed for pain.   albuterol (VENTOLIN HFA) 108 (90 Base) MCG/ACT inhaler Inhale 2 puffs into the lungs every 6 (six) hours as needed for shortness of breath.   allopurinol (ZYLOPRIM) 100 MG tablet TAKE 1 TABLET TWICE DAILY   amLODipine (NORVASC) 5 MG tablet Take 1 tablet (5 mg total)  by mouth daily.   Artificial Tear Solution (SOOTHE XP OP) Place 1 drop into both eyes daily.   aspirin EC 81 MG tablet Take 81 mg by mouth every morning.   cholecalciferol (VITAMIN D3) 25 MCG (1000 UNIT) tablet Take 1,000 Units by mouth daily.   Cranberry 500 MG TABS Take 500 mg by mouth daily.    cycloSPORINE (RESTASIS) 0.05 % ophthalmic emulsion Place 1 drop into both eyes 2 (two) times daily.   diclofenac sodium (VOLTAREN) 1 % GEL Apply 1 application topically daily as needed (arthritis).   docusate sodium (COLACE) 100 MG capsule Take 1 capsule (100 mg total) by mouth 2 (two) times daily as needed for mild constipation. (Patient taking differently: Take 100 mg by mouth daily.)   ferrous sulfate 325 (65 FE) MG tablet Take 325 mg by mouth daily with breakfast.   gabapentin (NEURONTIN) 300 MG capsule TAKE 1 CAPSULE TWO TO THREE TIMES DAILY AS NEEDED   glucose blood test strip TRUE Metrix Blood Glucose Test Strip, use as instructed   Homeopathic Products (THERAWORX RELIEF) FOAM Apply 1 application topically at bedtime.   Lancets MISC 1 each by Does not apply route 2 (two) times daily. TRUE matrix lancets   levothyroxine (SYNTHROID) 150 MCG tablet TAKE 1 TABLET ONE TIME DAILY ON 6 DAYS PER WEEK AND 1/2 TABLET ON 1 DAY PER WEEK   losartan (COZAAR) 100 MG tablet Take 1 tablet (100 mg total) by mouth daily.   Menthol, Topical Analgesic, (ZIMS MAX-FREEZE EX) Apply 1 application topically daily as needed (pain).   multivitamin (THERAGRAN) per tablet Take 1 tablet by mouth daily.   nitroGLYCERIN (NITROSTAT) 0.4 MG SL tablet Place 1 tablet (0.4 mg total) under the tongue every 5 (five) minutes as needed for chest pain.   Omega-3 Fatty Acids (FISH OIL) 1200 MG  CAPS Take 1,200 mg by mouth daily.   rosuvastatin (CRESTOR) 5 MG tablet TAKE 1 TABLET EVERY DAY   sodium chloride (OCEAN) 0.65 % nasal spray Place 1 spray into both nostrils at bedtime as needed for congestion.   No facility-administered encounter  medications on file as of 11/16/2020.   Reviewed chart prior to disease state call. Spoke with patient regarding BP  Recent Office Vitals: BP Readings from Last 3 Encounters:  09/29/20 (!) 143/53  09/08/20 (!) 179/86  08/25/20 (!) 147/71   Pulse Readings from Last 3 Encounters:  09/29/20 75  09/08/20 71  08/25/20 62    Wt Readings from Last 3 Encounters:  09/29/20 153 lb 9.6 oz (69.7 kg)  09/08/20 148 lb (67.1 kg)  08/25/20 151 lb (68.5 kg)     Kidney Function Lab Results  Component Value Date/Time   CREATININE 1.23 09/29/2020 10:24 AM   CREATININE 1.24 08/25/2020 11:14 AM   CREATININE 1.74 (H) 06/29/2020 10:23 AM   CREATININE 2.32 (H) 06/15/2020 12:35 PM   GFR 55.40 (L) 09/29/2020 10:24 AM   GFRNONAA 37 (L) 07/07/2020 01:15 PM   GFRNONAA 73 11/25/2012 11:03 AM   GFRAA 51 (L) 03/05/2020 03:34 AM   GFRAA 84 11/25/2012 11:03 AM    BMP Latest Ref Rng & Units 09/29/2020 08/25/2020 07/07/2020  Glucose 70 - 99 mg/dL 186(H) 99 104(H)  BUN 6 - 23 mg/dL 25(H) 29(H) 35(H)  Creatinine 0.40 - 1.50 mg/dL 1.23 1.24 1.81(H)  BUN/Creat Ratio 6 - 22 (calc) - - -  Sodium 135 - 145 mEq/L 140 142 140  Potassium 3.5 - 5.1 mEq/L 3.7 3.8 3.4(L)  Chloride 96 - 112 mEq/L 103 106 105  CO2 19 - 32 mEq/L 30 29 26   Calcium 8.4 - 10.5 mg/dL 9.5 9.3 9.4    Current antihypertensive regimen:  Amlodipine 5 mg daily Losartan 100 mg daily  How often are you checking your Blood Pressure? Patient stated he tries to check his blood pressure daily   Current home BP readings: Patient stated his blood pressure today was 150/70  What recent interventions/DTPs have been made by any provider to improve Blood Pressure control since last CPP Visit: None.  Any recent hospitalizations or ED visits since last visit with CPP? Yes, 09/08/20 for concussion, information shown above.  What diet changes have been made to improve Blood Pressure Control?  Patient stated he is not on any particular diet at [facility] time.  He stated he eats anything he can get his hands on, he stated he has a really good appetite. Patient stated he drinks green tea but he doesn't drink much water.   What exercise is being done to improve your Blood Pressure Control?  Patient stated he recently stopped going to therapy because he is planning on having surgery on his neck soon. He stated he doesn't do much because he's afraid he is going to fall, he started he's fallen multiple times in the past.   Adherence Review: Is the patient currently on ACE/ARB medication? Yes, Rosuvastatin 5 mg daily.  Does the patient have >5 day gap between last estimated fill dates? No.  Star Rating Drugs: Losartan 100 mg 08/24/20 90 DS  Patient stated he does not have any questions or concerns about his medication at this time.   Follow-Up: Pharmacist Review  Charlann Lange, RMA Clinical Pharmacist Assistant [phone]  8 minutes spent in review, coordination, and documentation.  Reviewed by: Beverly Milch, PharmD Clinical Pharmacist [facility] [phone]  [phone] ° ° °

## 2020-11-18 NOTE — Telephone Encounter (Signed)
Patient states he also need the actual machine.

## 2020-11-19 ENCOUNTER — Other Ambulatory Visit: Payer: Self-pay

## 2020-11-19 DIAGNOSIS — H35073 Retinal telangiectasis, bilateral: Secondary | ICD-10-CM

## 2020-11-19 MED ORDER — BLOOD GLUCOSE METER KIT: PACK | 0 refills | Status: DC

## 2020-11-19 NOTE — Telephone Encounter (Signed)
Rx sent to [facility] mail order meter kit with supplies true matrix

## 2020-11-19 NOTE — Progress Notes (Signed)
Your procedure is scheduled on Wednesday November 24, 2020.  Report to [facility] Main Entrance "A" at 11:30 A.M., and check in at the Admitting office.  Call this number if you have problems the morning of surgery: [phone]  Call [phone] if you have any questions prior to your surgery date Monday-Friday 8am-4pm   Remember: Do not eat or drink after midnight the night before your surgery   Take these medicines the morning of surgery with A SIP OF WATER: allopurinol (ZYLOPRIM) amLODipine (NORVASC)  hydrALAZINE (APRESOLINE) levothyroxine (SYNTHROID)   If needed: acetaminophen (TYLENOL)  albuterol (VENTOLIN HFA) nitroGLYCERIN (NITROSTAT)  Follow your surgeon's instructions on when to stop Aspirin.  If no instructions were given by your surgeon then you will need to call the office to get those instructions.    As of today, STOP taking any Aleve, Naproxen, Ibuprofen, Motrin, Advil, Goody's, BC's, all herbal medications, fish oil, and all vitamins.    The Morning of Surgery  Do not wear jewelry  Do not wear lotions, powders, colognes, or deodorant Men may shave face and neck.  Do not bring valuables to the hospital.  [facility] is not responsible for any belongings or valuables.  If you are a smoker, DO NOT Smoke 24 hours prior to surgery  If you wear a CPAP at night please bring your mask the morning of surgery   Remember that you must have someone to transport you home after your surgery, and remain with you for 24 hours if you are discharged the same day.   Please bring cases for contacts, glasses, hearing aids, dentures or bridgework because it cannot be worn into surgery.    Leave your suitcase in the car.  After surgery it may be brought to your room.  For patients admitted to the hospital, discharge time will be determined by your treatment team.  Patients discharged the day of surgery will not be allowed to drive home.    Special instructions:   [facility]- Preparing For Surgery  Before surgery, you can play an important role. Because skin is not sterile, your skin needs to be as free of germs as possible. You can reduce the number of germs on your skin by washing with CHG (chlorahexidine gluconate) Soap before surgery.  CHG is an antiseptic cleaner which kills germs and bonds with the skin to continue killing germs even after washing.    Oral Hygiene is also important to reduce your risk of infection.  Remember - BRUSH YOUR TEETH THE MORNING OF SURGERY WITH YOUR REGULAR TOOTHPASTE  Please do not use if you have an allergy to CHG or antibacterial soaps. If your skin becomes reddened/irritated stop using the CHG.  Do not shave (including legs and underarms) for at least 48 hours prior to first CHG shower. It is OK to shave your face.  Please follow these instructions carefully.   1. Shower the NIGHT BEFORE SURGERY and the MORNING OF SURGERY with CHG Soap.   2. If you chose to wash your hair and body, wash as usual with your normal shampoo and body-wash/soap.  3. Rinse your hair and body thoroughly to remove the shampoo and soap.  4. Apply CHG directly to the skin (ONLY FROM THE NECK DOWN) and wash gently with a scrungie or a clean washcloth.   5. Do not use on open wounds or open sores. Avoid contact with your eyes, ears, mouth and genitals (private parts). Wash Face and genitals (private parts)  with your normal soap.   6. Wash thoroughly, paying special attention to the area where your surgery will be performed.  7. Thoroughly rinse your body with warm water from the neck down.  8. DO NOT shower/wash with your normal soap after using and rinsing off the CHG Soap.  9. Pat yourself dry with a CLEAN TOWEL.  10. Wear CLEAN PAJAMAS to bed the night before surgery  11. Place CLEAN SHEETS on your bed the night of your first shower and DO NOT SLEEP WITH PETS.  12. Wear comfortable clothes the morning of surgery.     Day of  Surgery:  Please shower the morning of surgery with the CHG soap Do not apply any deodorants/lotions. Please wear clean clothes to the hospital/surgery center.   Remember to brush your teeth WITH YOUR REGULAR TOOTHPASTE.   Please read over the following fact sheets that you were given.

## 2020-11-20 ENCOUNTER — Other Ambulatory Visit: Payer: Self-pay | Admitting: Family

## 2020-11-22 ENCOUNTER — Other Ambulatory Visit: Payer: Self-pay

## 2020-11-22 ENCOUNTER — Telehealth: Payer: Self-pay | Admitting: Family

## 2020-11-22 ENCOUNTER — Encounter (HOSPITAL_COMMUNITY): Payer: Self-pay

## 2020-11-22 ENCOUNTER — Encounter (HOSPITAL_COMMUNITY)
Admission: RE | Admit: 2020-11-22 | Discharge: 2020-11-22 | Disposition: A | Discharge status: Home / Self Care | Payer: Medicare HMO | Source: Ambulatory Visit | Attending: Neurosurgery | Admitting: Neurosurgery

## 2020-11-22 DIAGNOSIS — R262 Difficulty in walking, not elsewhere classified: Secondary | ICD-10-CM | POA: Diagnosis present

## 2020-11-22 DIAGNOSIS — N179 Acute kidney failure, unspecified: Secondary | ICD-10-CM | POA: Diagnosis present

## 2020-11-22 DIAGNOSIS — Z4889 Encounter for other specified surgical aftercare: Secondary | ICD-10-CM | POA: Diagnosis not present

## 2020-11-22 DIAGNOSIS — R41841 Cognitive communication deficit: Secondary | ICD-10-CM | POA: Diagnosis not present

## 2020-11-22 DIAGNOSIS — R442 Other hallucinations: Secondary | ICD-10-CM | POA: Diagnosis not present

## 2020-11-22 DIAGNOSIS — K219 Gastro-esophageal reflux disease without esophagitis: Secondary | ICD-10-CM | POA: Diagnosis present

## 2020-11-22 DIAGNOSIS — Z85828 Personal history of other malignant neoplasm of skin: Secondary | ICD-10-CM | POA: Diagnosis not present

## 2020-11-22 DIAGNOSIS — R2681 Unsteadiness on feet: Secondary | ICD-10-CM | POA: Diagnosis not present

## 2020-11-22 DIAGNOSIS — Z4789 Encounter for other orthopedic aftercare: Secondary | ICD-10-CM | POA: Diagnosis not present

## 2020-11-22 DIAGNOSIS — R2689 Other abnormalities of gait and mobility: Secondary | ICD-10-CM | POA: Diagnosis not present

## 2020-11-22 DIAGNOSIS — E039 Hypothyroidism, unspecified: Secondary | ICD-10-CM | POA: Diagnosis present

## 2020-11-22 DIAGNOSIS — Z01812 Encounter for preprocedural laboratory examination: Secondary | ICD-10-CM | POA: Insufficient documentation

## 2020-11-22 DIAGNOSIS — R410 Disorientation, unspecified: Secondary | ICD-10-CM | POA: Diagnosis present

## 2020-11-22 DIAGNOSIS — I700 Atherosclerosis of aorta: Secondary | ICD-10-CM | POA: Diagnosis present

## 2020-11-22 DIAGNOSIS — M4712 Other spondylosis with myelopathy, cervical region: Secondary | ICD-10-CM | POA: Diagnosis present

## 2020-11-22 DIAGNOSIS — E785 Hyperlipidemia, unspecified: Secondary | ICD-10-CM | POA: Diagnosis present

## 2020-11-22 DIAGNOSIS — Z743 Need for continuous supervision: Secondary | ICD-10-CM | POA: Diagnosis not present

## 2020-11-22 DIAGNOSIS — E876 Hypokalemia: Secondary | ICD-10-CM | POA: Diagnosis present

## 2020-11-22 DIAGNOSIS — R441 Visual hallucinations: Secondary | ICD-10-CM | POA: Diagnosis present

## 2020-11-22 DIAGNOSIS — R1312 Dysphagia, oropharyngeal phase: Secondary | ICD-10-CM | POA: Diagnosis not present

## 2020-11-22 DIAGNOSIS — E7800 Pure hypercholesterolemia, unspecified: Secondary | ICD-10-CM | POA: Diagnosis not present

## 2020-11-22 DIAGNOSIS — I10 Essential (primary) hypertension: Secondary | ICD-10-CM | POA: Diagnosis present

## 2020-11-22 DIAGNOSIS — M6281 Muscle weakness (generalized): Secondary | ICD-10-CM | POA: Diagnosis not present

## 2020-11-22 DIAGNOSIS — Z96643 Presence of artificial hip joint, bilateral: Secondary | ICD-10-CM | POA: Diagnosis present

## 2020-11-22 DIAGNOSIS — J449 Chronic obstructive pulmonary disease, unspecified: Secondary | ICD-10-CM | POA: Diagnosis present

## 2020-11-22 DIAGNOSIS — Z981 Arthrodesis status: Secondary | ICD-10-CM | POA: Diagnosis not present

## 2020-11-22 DIAGNOSIS — R279 Unspecified lack of coordination: Secondary | ICD-10-CM | POA: Diagnosis not present

## 2020-11-22 DIAGNOSIS — G959 Disease of spinal cord, unspecified: Secondary | ICD-10-CM | POA: Diagnosis not present

## 2020-11-22 DIAGNOSIS — G4733 Obstructive sleep apnea (adult) (pediatric): Secondary | ICD-10-CM | POA: Diagnosis present

## 2020-11-22 DIAGNOSIS — M48061 Spinal stenosis, lumbar region without neurogenic claudication: Secondary | ICD-10-CM | POA: Diagnosis not present

## 2020-11-22 DIAGNOSIS — Z8249 Family history of ischemic heart disease and other diseases of the circulatory system: Secondary | ICD-10-CM | POA: Diagnosis not present

## 2020-11-22 DIAGNOSIS — Z20822 Contact with and (suspected) exposure to covid-19: Secondary | ICD-10-CM | POA: Insufficient documentation

## 2020-11-22 DIAGNOSIS — Z87891 Personal history of nicotine dependence: Secondary | ICD-10-CM | POA: Diagnosis not present

## 2020-11-22 DIAGNOSIS — J439 Emphysema, unspecified: Secondary | ICD-10-CM | POA: Diagnosis present

## 2020-11-22 DIAGNOSIS — M4802 Spinal stenosis, cervical region: Secondary | ICD-10-CM | POA: Diagnosis present

## 2020-11-22 LAB — BASIC METABOLIC PANEL
Anion gap: 7 (ref 5–15)
BUN: 16 mg/dL (ref 8–23)
CO2: 30 mmol/L (ref 22–32)
Calcium: 9.3 mg/dL (ref 8.9–10.3)
Chloride: 106 mmol/L (ref 98–111)
Creatinine, Ser: 1.26 mg/dL — ABNORMAL HIGH (ref 0.61–1.24)
GFR, Estimated: 58 mL/min — ABNORMAL LOW (ref >60)
Glucose, Bld: 106 mg/dL — ABNORMAL HIGH (ref 70–99)
Potassium: 4.1 mmol/L (ref 3.5–5.1)
Sodium: 143 mmol/L (ref 135–145)

## 2020-11-22 LAB — CBC
HCT: 40.5 % (ref 39.0–52.0)
Hemoglobin: 13 g/dL (ref 13.0–17.0)
MCH: 32.3 pg (ref 26.0–34.0)
MCHC: 32.1 g/dL (ref 30.0–36.0)
MCV: 100.5 fL — ABNORMAL HIGH (ref 80.0–100.0)
Platelets: 306 10*3/uL (ref 150–400)
RBC: 4.03 MIL/uL — ABNORMAL LOW (ref 4.22–5.81)
RDW: 13.1 % (ref 11.5–15.5)
WBC: 7.1 10*3/uL (ref 4.0–10.5)
nRBC: 0 % (ref 0.0–0.2)

## 2020-11-22 LAB — SARS CORONAVIRUS 2 (TAT 6-24 HRS): SARS Coronavirus 2: NEGATIVE

## 2020-11-22 LAB — SURGICAL PCR SCREEN
MRSA, PCR: NEGATIVE
Staphylococcus aureus: NEGATIVE

## 2020-11-22 LAB — TYPE AND SCREEN
ABO/RH(D): O POS
Antibody Screen: NEGATIVE

## 2020-11-22 MED ORDER — ALCOHOL PADS 70 % PADS: 1.0000 | MEDICATED_PAD | Freq: Two times a day (BID) | 1 refills | Status: DC

## 2020-11-22 NOTE — Telephone Encounter (Signed)
[facility] calling for alcohol swab pads

## 2020-11-22 NOTE — Telephone Encounter (Signed)
rx sent to [facility]

## 2020-11-22 NOTE — Progress Notes (Signed)
PCP - [name], NP Cardiologist - Dr. Jenkins Rouge  PPM/ICD - denies  Chest x-ray - 03/03/2020 EKG - 06/28/2020 Stress Test - 01/25/16 ECHO - 02/10/16 Cardiac Cath - denies  Sleep Study - 2020 CPAP - per patient wears CPAP every night   DM: denies  Blood Thinner Instructions: N/A Aspirin Instructions: per patient, stopped taking over a week ago  ERAS Protcol - No orders  COVID TEST- 11/22/2020, at PAT appointment. Patient verbalized understanding of self-quarantine instructions.  Anesthesia review: YES, cardiac hx  Patient denies shortness of breath, fever, cough and chest pain at PAT appointment  All instructions explained to the patient, with a verbal understanding of the material. Patient agrees to go over the instructions while at home for a better understanding. Patient also instructed to self quarantine after being tested for COVID-19. The opportunity to ask questions was provided.

## 2020-11-23 NOTE — Anesthesia Preprocedure Evaluation (Addendum)
Anesthesia Evaluation  Patient identified by MRN, date of birth, ID band Patient awake    Reviewed: Allergy & Precautions, NPO status , Patient's Chart, lab work & pertinent test results  History of Anesthesia Complications (+) PONV and history of anesthetic complications  Airway Mallampati: II  TM Distance: >3 FB Neck ROM: Limited    Dental  (+) Teeth Intact, Dental Advisory Given, Caps,    Pulmonary sleep apnea , COPD, former smoker,    Pulmonary exam normal breath sounds clear to auscultation       Cardiovascular hypertension, Pt. on medications + Peripheral Vascular Disease  Normal cardiovascular exam Rhythm:Regular Rate:Normal     Neuro/Psych Cervical Stenosis  Neuromuscular disease    GI/Hepatic Neg liver ROS, GERD  ,  Endo/Other  Hypothyroidism   Renal/GU negative Renal ROS     Musculoskeletal  (+) Arthritis ,   Abdominal   Peds  Hematology negative hematology ROS (+)   Anesthesia Other Findings Day of surgery medications reviewed with the patient.  Reproductive/Obstetrics                           Anesthesia Physical Anesthesia Plan  ASA: III  Anesthesia Plan: General   Post-op Pain Management:    Induction: Intravenous  PONV Risk Score and Plan: 3 and Dexamethasone, Ondansetron and Propofol infusion  Airway Management Planned: Oral ETT and Video Laryngoscope Planned  Additional Equipment:   Intra-op Plan:   Post-operative Plan: Extubation in OR  Informed Consent: I have reviewed the patients History and Physical, chart, labs and discussed the procedure including the risks, benefits and alternatives for the proposed anesthesia with the patient or authorized representative who has indicated his/her understanding and acceptance.     Dental advisory given  Plan Discussed with: CRNA  Anesthesia Plan Comments: (2nd PIV for propofol infusion  PAT note by Karoline Caldwell, PA-C: Follows with cardiology for hx of history of HLD, left bruit , HTN and COPD. He was seen by Dr. Johnsie Cancel 06/28/20 prior to having C3-4 ACDF. Per note , "No history of CAD with normal myovue in 2008 and may of 2017. Last TTE done June 2017 with EF 55-60% AV sclerosis Duplex done March 2021with new criteria read as plaque no stenosis bilaterally." Surgical clearance also addressed per note, "Ok to have surgery from cardiac perspective with Dr Saintclair Halsted 07/09/20 due to Spinal cord compression and armweakness Co morbidities of CRF, COPD and age biggest issue not cardiac." Pt did subsequently undergo ACDF on 96/29/52 without complication.   Last seen by PCP Lemar Livings, NP on 09/29/2020, chronic conditions stable at that time.  OSA on CPAP.  Preop labs reviewed, creatinine mildly elevated 1.26 consistent with his history of renal sufficiency.    Labs otherwise unremarkable.  EKG 06/28/2020: Sinus rhythm.  Rate 78.  Right bundle branch block.  Left anterior fascicular block.  Frequent PACs.  CT chest 03/03/2020: IMPRESSION: 1. Bandlike areas of atelectasis in the left lung base may correspond to the abnormality seen on comparison radiography. 2. No other acute intrathoracic process. Specifically, no evidence of pneumonia. 3. Few stable, likely postinflammatory, sub 3 mm nodules in the right upper lobe. 4. Unchanged sliding-type hiatal hernia. 5. Aortic Atherosclerosis (ICD10-I70.0) 6. Emphysema (ICD10-J43.9) and chronic bronchitic features. May correlate with smoking history.  Carotid ultrasound 12/09/2019: Summary:  Right Carotid: Velocities in the right ICA are consistent with a 1-39% stenosis.  Left Carotid: Velocities in the left ICA are consistent  with a 1-39% stenosis. Non-hemodynamically significant plaque <50% noted in the CCA.  Vertebrals: Bilateral vertebral arteries demonstrate antegrade flow.  Subclavians: Normal flow hemodynamics were seen in bilateral subclavian  arteries.    TTE 02/10/2016: - Left ventricle: The cavity size was normal. There was mild  concentric hypertrophy. Systolic function was normal. The  estimated ejection fraction was in the range of 55% to 60%. Wall  motion was normal; there were no regional wall motion  abnormalities. There was an increased relative contribution of  atrial contraction to ventricular filling. Doppler parameters are  consistent with abnormal left ventricular relaxation (grade 1  diastolic dysfunction).  - Aortic valve: Moderately calcified annulus. Trileaflet. Mild  diffuse thickening and calcification.  - Mitral valve: Calcified annulus.  - Left atrium: The atrium was mildly dilated.  - Pulmonary arteries: PA peak pressure: 31 mm Hg (S).  Nuclear stress 01/25/2016: Nuclear stress EF: 48%. There was no ST segment deviation noted during stress. No T wave inversion was noted during stress. Heart rate 30 bpm after infusion of Lexiscan. The left ventricular ejection fraction is mildly decreased (45-54%). This is an intermediate risk study due to reduced systolic function. There was no ischemia.   PFT 12/01/16: FVC-%Pred-Pre Latest Units: % 103 FEV1-%Pred-Pre Latest Units: % 106 TLC % pred Latest Units: % 109 RV % pred Latest Units: % 115 DLCO unc % pred Latest Units: % 93   )      Anesthesia Quick Evaluation

## 2020-11-23 NOTE — Progress Notes (Signed)
Anesthesia Chart Review:  Follows with cardiology for hx of history of HLD, left bruit , HTN and COPD. He was seen by Dr. Johnsie Cancel 06/28/20 prior to having C3-4 ACDF. Per note , "No history of CAD with normal myovue in 2008 and may of 2017. Last TTE done June 2017 with EF 55-60% AV sclerosis Duplex done March 2021with new criteria read as plaque no stenosis bilaterally." Surgical clearance also addressed per note, "Ok to have surgery from cardiac perspective with Dr Saintclair Halsted 07/09/20 due to Spinal cord compression and armweakness Co morbidities of CRF, COPD and age biggest issue not cardiac." Pt did subsequently undergo ACDF on 22/29/79 without complication.   Last seen by PCP Lemar Livings, NP on 09/29/2020, chronic conditions stable at that time.  OSA on CPAP.  Preop labs reviewed, creatinine mildly elevated 1.26 consistent with his history of renal sufficiency.    Labs otherwise unremarkable.  EKG 06/28/2020: Sinus rhythm.  Rate 78.  Right bundle branch block.  Left anterior fascicular block.  Frequent PACs.  CT chest 03/03/2020: IMPRESSION: 1. Bandlike areas of atelectasis in the left lung base may correspond to the abnormality seen on comparison radiography. 2. No other acute intrathoracic process. Specifically, no evidence of pneumonia. 3. Few stable, likely postinflammatory, sub 3 mm nodules in the right upper lobe. 4. Unchanged sliding-type hiatal hernia. 5. Aortic Atherosclerosis (ICD10-I70.0) 6. Emphysema (ICD10-J43.9) and chronic bronchitic features. May correlate with smoking history.  Carotid ultrasound 12/09/2019: Summary:  Right Carotid: Velocities in the right ICA are consistent with a 1-39% stenosis.  Left Carotid: Velocities in the left ICA are consistent with a 1-39% stenosis. Non-hemodynamically significant plaque <50% noted in the CCA.  Vertebrals: Bilateral vertebral arteries demonstrate antegrade flow.  Subclavians: Normal flow hemodynamics were seen in  bilateral subclavian arteries.    TTE 02/10/2016: - Left ventricle: The cavity size was normal. There was mild  concentric hypertrophy. Systolic function was normal. The  estimated ejection fraction was in the range of 55% to 60%. Wall  motion was normal; there were no regional wall motion  abnormalities. There was an increased relative contribution of  atrial contraction to ventricular filling. Doppler parameters are  consistent with abnormal left ventricular relaxation (grade 1  diastolic dysfunction).  - Aortic valve: Moderately calcified annulus. Trileaflet. Mild  diffuse thickening and calcification.  - Mitral valve: Calcified annulus.  - Left atrium: The atrium was mildly dilated.  - Pulmonary arteries: PA peak pressure: 31 mm Hg (S).  Nuclear stress 01/25/2016:  Nuclear stress EF: 48%.  There was no ST segment deviation noted during stress.  No T wave inversion was noted during stress.  Heart rate 30 bpm after infusion of Lexiscan.  The left ventricular ejection fraction is mildly decreased (45-54%).  This is an intermediate risk study due to reduced systolic function. There was no ischemia.   PFT 12/01/16: FVC-%Pred-Pre Latest Units: % 103  FEV1-%Pred-Pre Latest Units: % 106  TLC % pred Latest Units: % 109  RV % pred Latest Units: % 115  DLCO unc % pred Latest Units: % [address]     Wynonia Musty [facility] Short Stay Center/Anesthesiology Phone [phone] 11/23/2020 9:49 AM

## 2020-11-24 ENCOUNTER — Inpatient Hospital Stay (HOSPITAL_COMMUNITY)
Admission: RE | Admit: 2020-11-24 | Discharge: 2020-12-03 | DRG: 472 | Disposition: A | Discharge status: Skilled Nursing Facility | Payer: Medicare HMO | Source: Ambulatory Visit | Attending: Neurosurgery | Admitting: Neurosurgery

## 2020-11-24 ENCOUNTER — Encounter (HOSPITAL_COMMUNITY): Payer: Self-pay | Admitting: Neurosurgery

## 2020-11-24 ENCOUNTER — Inpatient Hospital Stay (HOSPITAL_COMMUNITY): Payer: Medicare HMO | Admitting: Anesthesiology

## 2020-11-24 ENCOUNTER — Encounter (HOSPITAL_COMMUNITY)
Admission: RE | Disposition: A | Discharge status: Skilled Nursing Facility | Payer: Self-pay | Source: Ambulatory Visit | Attending: Neurosurgery

## 2020-11-24 ENCOUNTER — Inpatient Hospital Stay (HOSPITAL_COMMUNITY): Payer: Medicare HMO

## 2020-11-24 ENCOUNTER — Other Ambulatory Visit: Payer: Self-pay

## 2020-11-24 ENCOUNTER — Inpatient Hospital Stay (HOSPITAL_COMMUNITY): Payer: Medicare HMO | Admitting: Physician Assistant

## 2020-11-24 DIAGNOSIS — R441 Visual hallucinations: Secondary | ICD-10-CM | POA: Diagnosis present

## 2020-11-24 DIAGNOSIS — H35073 Retinal telangiectasis, bilateral: Secondary | ICD-10-CM

## 2020-11-24 DIAGNOSIS — E7800 Pure hypercholesterolemia, unspecified: Secondary | ICD-10-CM | POA: Diagnosis not present

## 2020-11-24 DIAGNOSIS — R2681 Unsteadiness on feet: Secondary | ICD-10-CM | POA: Diagnosis not present

## 2020-11-24 DIAGNOSIS — I10 Essential (primary) hypertension: Secondary | ICD-10-CM | POA: Diagnosis present

## 2020-11-24 DIAGNOSIS — I700 Atherosclerosis of aorta: Secondary | ICD-10-CM | POA: Diagnosis present

## 2020-11-24 DIAGNOSIS — Z20822 Contact with and (suspected) exposure to covid-19: Secondary | ICD-10-CM | POA: Diagnosis present

## 2020-11-24 DIAGNOSIS — J439 Emphysema, unspecified: Secondary | ICD-10-CM | POA: Diagnosis present

## 2020-11-24 DIAGNOSIS — Z85828 Personal history of other malignant neoplasm of skin: Secondary | ICD-10-CM | POA: Diagnosis not present

## 2020-11-24 DIAGNOSIS — Z87891 Personal history of nicotine dependence: Secondary | ICD-10-CM

## 2020-11-24 DIAGNOSIS — Z4889 Encounter for other specified surgical aftercare: Secondary | ICD-10-CM | POA: Diagnosis not present

## 2020-11-24 DIAGNOSIS — E876 Hypokalemia: Secondary | ICD-10-CM | POA: Diagnosis present

## 2020-11-24 DIAGNOSIS — M4802 Spinal stenosis, cervical region: Secondary | ICD-10-CM | POA: Diagnosis present

## 2020-11-24 DIAGNOSIS — Z8249 Family history of ischemic heart disease and other diseases of the circulatory system: Secondary | ICD-10-CM | POA: Diagnosis not present

## 2020-11-24 DIAGNOSIS — M6281 Muscle weakness (generalized): Secondary | ICD-10-CM | POA: Diagnosis not present

## 2020-11-24 DIAGNOSIS — J449 Chronic obstructive pulmonary disease, unspecified: Secondary | ICD-10-CM | POA: Diagnosis present

## 2020-11-24 DIAGNOSIS — Z743 Need for continuous supervision: Secondary | ICD-10-CM | POA: Diagnosis not present

## 2020-11-24 DIAGNOSIS — Z96643 Presence of artificial hip joint, bilateral: Secondary | ICD-10-CM | POA: Diagnosis present

## 2020-11-24 DIAGNOSIS — R41841 Cognitive communication deficit: Secondary | ICD-10-CM | POA: Diagnosis not present

## 2020-11-24 DIAGNOSIS — E039 Hypothyroidism, unspecified: Secondary | ICD-10-CM | POA: Diagnosis present

## 2020-11-24 DIAGNOSIS — R410 Disorientation, unspecified: Secondary | ICD-10-CM | POA: Diagnosis present

## 2020-11-24 DIAGNOSIS — R2689 Other abnormalities of gait and mobility: Secondary | ICD-10-CM | POA: Diagnosis not present

## 2020-11-24 DIAGNOSIS — Z981 Arthrodesis status: Secondary | ICD-10-CM

## 2020-11-24 DIAGNOSIS — M4712 Other spondylosis with myelopathy, cervical region: Principal | ICD-10-CM | POA: Diagnosis present

## 2020-11-24 DIAGNOSIS — R262 Difficulty in walking, not elsewhere classified: Secondary | ICD-10-CM | POA: Diagnosis present

## 2020-11-24 DIAGNOSIS — N179 Acute kidney failure, unspecified: Secondary | ICD-10-CM | POA: Diagnosis present

## 2020-11-24 DIAGNOSIS — G959 Disease of spinal cord, unspecified: Secondary | ICD-10-CM | POA: Diagnosis not present

## 2020-11-24 DIAGNOSIS — Z4789 Encounter for other orthopedic aftercare: Secondary | ICD-10-CM | POA: Diagnosis not present

## 2020-11-24 DIAGNOSIS — G4733 Obstructive sleep apnea (adult) (pediatric): Secondary | ICD-10-CM | POA: Diagnosis present

## 2020-11-24 DIAGNOSIS — K219 Gastro-esophageal reflux disease without esophagitis: Secondary | ICD-10-CM | POA: Diagnosis present

## 2020-11-24 DIAGNOSIS — R442 Other hallucinations: Secondary | ICD-10-CM | POA: Diagnosis not present

## 2020-11-24 DIAGNOSIS — M48061 Spinal stenosis, lumbar region without neurogenic claudication: Secondary | ICD-10-CM | POA: Diagnosis not present

## 2020-11-24 DIAGNOSIS — E785 Hyperlipidemia, unspecified: Secondary | ICD-10-CM | POA: Diagnosis not present

## 2020-11-24 DIAGNOSIS — R279 Unspecified lack of coordination: Secondary | ICD-10-CM | POA: Diagnosis not present

## 2020-11-24 DIAGNOSIS — Z419 Encounter for procedure for purposes other than remedying health state, unspecified: Secondary | ICD-10-CM

## 2020-11-24 DIAGNOSIS — R1312 Dysphagia, oropharyngeal phase: Secondary | ICD-10-CM | POA: Diagnosis not present

## 2020-11-24 HISTORY — PX: POSTERIOR CERVICAL FUSION/FORAMINOTOMY: SHX5038

## 2020-11-24 HISTORY — DX: Disease of spinal cord, unspecified: G95.9

## 2020-11-24 LAB — GLUCOSE, CAPILLARY: Glucose-Capillary: 185 mg/dL — ABNORMAL HIGH (ref 70–99)

## 2020-11-24 SURGERY — POSTERIOR CERVICAL FUSION/FORAMINOTOMY LEVEL 2
Anesthesia: General | Site: Neck

## 2020-11-24 MED ORDER — OXYCODONE HCL 5 MG PO TABS
10.0000 mg | ORAL_TABLET | ORAL | Status: DC | PRN
  Administered 2020-11-24 – 2020-12-01 (×21): 10 mg via ORAL
  Filled 2020-11-24 (×22): qty 2

## 2020-11-24 MED ORDER — ADULT MULTIVITAMIN W/MINERALS CH
1.0000 | ORAL_TABLET | Freq: Every day | ORAL | Status: DC
  Administered 2020-11-25 – 2020-12-03 (×9): 1 via ORAL
  Filled 2020-11-24 (×9): qty 1

## 2020-11-24 MED ORDER — PHENOL 1.4 % MT LIQD: 1.0000 | OROMUCOSAL | Status: DC | PRN

## 2020-11-24 MED ORDER — ONDANSETRON HCL 4 MG/2ML IJ SOLN
INTRAMUSCULAR | Status: DC | PRN
  Administered 2020-11-24: 4 mg via INTRAVENOUS

## 2020-11-24 MED ORDER — SODIUM CHLORIDE 0.9 % IV SOLN: 250.0000 mL | INTRAVENOUS | Status: DC

## 2020-11-24 MED ORDER — CEFAZOLIN SODIUM-DEXTROSE 2-4 GM/100ML-% IV SOLN
2.0000 g | Freq: Three times a day (TID) | INTRAVENOUS | Status: AC
  Administered 2020-11-24 – 2020-11-25 (×2): 2 g via INTRAVENOUS
  Filled 2020-11-24 (×2): qty 100

## 2020-11-24 MED ORDER — CHLORHEXIDINE GLUCONATE 0.12 % MT SOLN
OROMUCOSAL | Status: AC
  Administered 2020-11-24: 15 mL via OROMUCOSAL
  Filled 2020-11-24: qty 15

## 2020-11-24 MED ORDER — CHLORHEXIDINE GLUCONATE CLOTH 2 % EX PADS: 6.0000 | MEDICATED_PAD | Freq: Once | CUTANEOUS | Status: DC

## 2020-11-24 MED ORDER — MENTHOL (TOPICAL ANALGESIC) 3.7 % EX GEL: Freq: Every day | CUTANEOUS | Status: DC | PRN

## 2020-11-24 MED ORDER — COLCHICINE 0.6 MG PO TABS
0.6000 mg | ORAL_TABLET | Freq: Every day | ORAL | Status: DC | PRN
  Administered 2020-11-29: 0.6 mg via ORAL
  Filled 2020-11-24 (×2): qty 1

## 2020-11-24 MED ORDER — PHENYLEPHRINE 40 MCG/ML (10ML) SYRINGE FOR IV PUSH (FOR BLOOD PRESSURE SUPPORT)
PREFILLED_SYRINGE | INTRAVENOUS | Status: DC | PRN
  Administered 2020-11-24: 80 ug via INTRAVENOUS

## 2020-11-24 MED ORDER — LEVOTHYROXINE SODIUM 75 MCG PO TABS
75.0000 ug | ORAL_TABLET | ORAL | Status: DC
  Administered 2020-11-28: 75 ug via ORAL
  Filled 2020-11-24 (×2): qty 1

## 2020-11-24 MED ORDER — THROMBIN 5000 UNITS EX KIT
PACK | CUTANEOUS | Status: AC
  Filled 2020-11-24: qty 1

## 2020-11-24 MED ORDER — DOCUSATE SODIUM 100 MG PO CAPS
100.0000 mg | ORAL_CAPSULE | Freq: Every day | ORAL | Status: DC | PRN
  Administered 2020-11-27 – 2020-12-03 (×4): 100 mg via ORAL
  Filled 2020-11-24 (×4): qty 1

## 2020-11-24 MED ORDER — PANTOPRAZOLE SODIUM 40 MG IV SOLR
40.0000 mg | Freq: Every day | INTRAVENOUS | Status: DC
  Administered 2020-11-24: 40 mg via INTRAVENOUS
  Filled 2020-11-24: qty 40

## 2020-11-24 MED ORDER — LACTATED RINGERS IV SOLN: INTRAVENOUS | Status: DC | PRN

## 2020-11-24 MED ORDER — LEVOTHYROXINE SODIUM 75 MCG PO TABS
150.0000 ug | ORAL_TABLET | ORAL | Status: DC
  Administered 2020-11-25 – 2020-12-03 (×8): 150 ug via ORAL
  Filled 2020-11-24 (×8): qty 2

## 2020-11-24 MED ORDER — BUPIVACAINE HCL (PF) 0.25 % IJ SOLN
INTRAMUSCULAR | Status: AC
  Filled 2020-11-24: qty 30

## 2020-11-24 MED ORDER — LIDOCAINE 2% (20 MG/ML) 5 ML SYRINGE
INTRAMUSCULAR | Status: DC | PRN
  Administered 2020-11-24: 80 mg via INTRAVENOUS

## 2020-11-24 MED ORDER — ALBUMIN HUMAN 5 % IV SOLN: INTRAVENOUS | Status: DC | PRN

## 2020-11-24 MED ORDER — ONDANSETRON HCL 4 MG PO TABS: 4.0000 mg | ORAL_TABLET | Freq: Four times a day (QID) | ORAL | Status: DC | PRN

## 2020-11-24 MED ORDER — CYCLOSPORINE 0.05 % OP EMUL
1.0000 [drp] | Freq: Two times a day (BID) | OPHTHALMIC | Status: DC
  Administered 2020-11-25 – 2020-12-03 (×16): 1 [drp] via OPHTHALMIC
  Filled 2020-11-24 (×18): qty 1

## 2020-11-24 MED ORDER — FENTANYL CITRATE (PF) 250 MCG/5ML IJ SOLN
INTRAMUSCULAR | Status: DC | PRN
  Administered 2020-11-24: 100 ug via INTRAVENOUS
  Administered 2020-11-24 (×2): 50 ug via INTRAVENOUS

## 2020-11-24 MED ORDER — SUGAMMADEX SODIUM 200 MG/2ML IV SOLN
INTRAVENOUS | Status: DC | PRN
  Administered 2020-11-24: 200 mg via INTRAVENOUS

## 2020-11-24 MED ORDER — PHENYLEPHRINE 40 MCG/ML (10ML) SYRINGE FOR IV PUSH (FOR BLOOD PRESSURE SUPPORT)
PREFILLED_SYRINGE | INTRAVENOUS | Status: AC
  Filled 2020-11-24: qty 10

## 2020-11-24 MED ORDER — 0.9 % SODIUM CHLORIDE (POUR BTL) OPTIME
TOPICAL | Status: DC | PRN
  Administered 2020-11-24: 1000 mL

## 2020-11-24 MED ORDER — VITAMIN D 25 MCG (1000 UNIT) PO TABS
2000.0000 [IU] | ORAL_TABLET | Freq: Every day | ORAL | Status: DC
  Administered 2020-11-25 – 2020-12-03 (×9): 2000 [IU] via ORAL
  Filled 2020-11-24 (×9): qty 2

## 2020-11-24 MED ORDER — SODIUM CHLORIDE 0.9% FLUSH
3.0000 mL | Freq: Two times a day (BID) | INTRAVENOUS | Status: DC
  Administered 2020-11-25 – 2020-12-03 (×16): 3 mL via INTRAVENOUS

## 2020-11-24 MED ORDER — SALINE SPRAY 0.65 % NA SOLN
1.0000 | Freq: Every evening | NASAL | Status: DC | PRN
  Filled 2020-11-24: qty 44

## 2020-11-24 MED ORDER — LOSARTAN POTASSIUM 50 MG PO TABS
100.0000 mg | ORAL_TABLET | Freq: Every day | ORAL | Status: DC
  Administered 2020-11-24 – 2020-12-02 (×9): 100 mg via ORAL
  Filled 2020-11-24 (×9): qty 2

## 2020-11-24 MED ORDER — ROSUVASTATIN CALCIUM 5 MG PO TABS
5.0000 mg | ORAL_TABLET | Freq: Every day | ORAL | Status: DC
  Administered 2020-11-25 – 2020-12-03 (×9): 5 mg via ORAL
  Filled 2020-11-24 (×9): qty 1

## 2020-11-24 MED ORDER — LIDOCAINE-EPINEPHRINE 1 %-1:100000 IJ SOLN
INTRAMUSCULAR | Status: AC
  Filled 2020-11-24: qty 1

## 2020-11-24 MED ORDER — THROMBIN 20000 UNITS EX SOLR: CUTANEOUS | Status: DC | PRN

## 2020-11-24 MED ORDER — ORAL CARE MOUTH RINSE: 15.0000 mL | Freq: Once | OROMUCOSAL | Status: AC

## 2020-11-24 MED ORDER — SOOTHE XP OP SOLN: 1.0000 [drp] | Freq: Two times a day (BID) | OPHTHALMIC | Status: DC | PRN

## 2020-11-24 MED ORDER — ALUM & MAG HYDROXIDE-SIMETH 200-200-20 MG/5ML PO SUSP: 30.0000 mL | Freq: Four times a day (QID) | ORAL | Status: DC | PRN

## 2020-11-24 MED ORDER — SODIUM CHLORIDE 0.9% FLUSH: 3.0000 mL | INTRAVENOUS | Status: DC | PRN

## 2020-11-24 MED ORDER — ACETAMINOPHEN 500 MG PO TABS
1000.0000 mg | ORAL_TABLET | Freq: Once | ORAL | Status: AC
  Administered 2020-11-24: 1000 mg via ORAL
  Filled 2020-11-24: qty 2

## 2020-11-24 MED ORDER — ROCURONIUM BROMIDE 10 MG/ML (PF) SYRINGE
PREFILLED_SYRINGE | INTRAVENOUS | Status: DC | PRN
  Administered 2020-11-24: 20 mg via INTRAVENOUS
  Administered 2020-11-24: 60 mg via INTRAVENOUS

## 2020-11-24 MED ORDER — CEFAZOLIN SODIUM-DEXTROSE 2-4 GM/100ML-% IV SOLN
INTRAVENOUS | Status: AC
  Filled 2020-11-24: qty 100

## 2020-11-24 MED ORDER — MUSCLE RUB 10-15 % EX CREA
TOPICAL_CREAM | CUTANEOUS | Status: DC | PRN
  Filled 2020-11-24: qty 85

## 2020-11-24 MED ORDER — PROPOFOL 500 MG/50ML IV EMUL
INTRAVENOUS | Status: DC | PRN
  Administered 2020-11-24: 25 ug/kg/min via INTRAVENOUS

## 2020-11-24 MED ORDER — PROPOFOL 1000 MG/100ML IV EMUL
INTRAVENOUS | Status: AC
  Filled 2020-11-24: qty 100

## 2020-11-24 MED ORDER — PHENYLEPHRINE 40 MCG/ML (10ML) SYRINGE FOR IV PUSH (FOR BLOOD PRESSURE SUPPORT)
PREFILLED_SYRINGE | INTRAVENOUS | Status: AC
  Filled 2020-11-24: qty 20

## 2020-11-24 MED ORDER — HYDROMORPHONE HCL 1 MG/ML IJ SOLN
0.5000 mg | INTRAMUSCULAR | Status: DC | PRN
Start: 2020-11-24 — End: 2020-12-01
  Administered 2020-11-24 – 2020-11-29 (×3): 0.5 mg via INTRAVENOUS
  Filled 2020-11-24 (×3): qty 0.5

## 2020-11-24 MED ORDER — PROPOFOL 10 MG/ML IV BOLUS
INTRAVENOUS | Status: DC | PRN
  Administered 2020-11-24: 20 mg via INTRAVENOUS
  Administered 2020-11-24: 140 mg via INTRAVENOUS

## 2020-11-24 MED ORDER — THERAWORX RELIEF EX FOAM: 1.0000 "application " | Freq: Two times a day (BID) | CUTANEOUS | Status: DC | PRN

## 2020-11-24 MED ORDER — FENTANYL CITRATE (PF) 100 MCG/2ML IJ SOLN
25.0000 ug | INTRAMUSCULAR | Status: DC | PRN
Start: 2020-11-24 — End: 2020-11-24
  Administered 2020-11-24: 50 ug via INTRAVENOUS

## 2020-11-24 MED ORDER — POLYVINYL ALCOHOL 1.4 % OP SOLN
1.0000 [drp] | OPHTHALMIC | Status: DC | PRN
  Filled 2020-11-24: qty 15

## 2020-11-24 MED ORDER — EPHEDRINE SULFATE-NACL 50-0.9 MG/10ML-% IV SOSY
PREFILLED_SYRINGE | INTRAVENOUS | Status: DC | PRN
  Administered 2020-11-24: 10 mg via INTRAVENOUS
  Administered 2020-11-24: 5 mg via INTRAVENOUS

## 2020-11-24 MED ORDER — CHLORHEXIDINE GLUCONATE 0.12 % MT SOLN
15.0000 mL | Freq: Once | OROMUCOSAL | Status: AC
  Filled 2020-11-24: qty 15

## 2020-11-24 MED ORDER — ALLOPURINOL 100 MG PO TABS
100.0000 mg | ORAL_TABLET | Freq: Two times a day (BID) | ORAL | Status: DC
  Administered 2020-11-24 – 2020-12-03 (×18): 100 mg via ORAL
  Filled 2020-11-24 (×19): qty 1

## 2020-11-24 MED ORDER — DEXAMETHASONE SODIUM PHOSPHATE 10 MG/ML IJ SOLN: 10.0000 mg | Freq: Once | INTRAMUSCULAR | Status: DC

## 2020-11-24 MED ORDER — ARTIFICIAL TEARS OPHTHALMIC OINT
TOPICAL_OINTMENT | OPHTHALMIC | Status: AC
  Filled 2020-11-24: qty 3.5

## 2020-11-24 MED ORDER — AMLODIPINE BESYLATE 5 MG PO TABS
5.0000 mg | ORAL_TABLET | Freq: Every day | ORAL | Status: DC
  Administered 2020-11-25 – 2020-12-01 (×7): 5 mg via ORAL
  Filled 2020-11-24 (×7): qty 1

## 2020-11-24 MED ORDER — BACITRACIN ZINC 500 UNIT/GM EX OINT
TOPICAL_OINTMENT | CUTANEOUS | Status: DC | PRN
  Administered 2020-11-24: 1 via TOPICAL

## 2020-11-24 MED ORDER — CRANBERRY 500 MG PO TABS: 500.0000 mg | ORAL_TABLET | Freq: Every day | ORAL | Status: DC

## 2020-11-24 MED ORDER — GLYCOPYRROLATE PF 0.2 MG/ML IJ SOSY
PREFILLED_SYRINGE | INTRAMUSCULAR | Status: DC | PRN
  Administered 2020-11-24: .1 mg via INTRAVENOUS

## 2020-11-24 MED ORDER — CEFAZOLIN SODIUM-DEXTROSE 2-4 GM/100ML-% IV SOLN
2.0000 g | INTRAVENOUS | Status: AC
  Administered 2020-11-24: 2 g via INTRAVENOUS

## 2020-11-24 MED ORDER — MENTHOL 3 MG MT LOZG: 1.0000 | LOZENGE | OROMUCOSAL | Status: DC | PRN

## 2020-11-24 MED ORDER — ONDANSETRON HCL 4 MG/2ML IJ SOLN
4.0000 mg | Freq: Four times a day (QID) | INTRAMUSCULAR | Status: DC | PRN
  Administered 2020-11-24: 4 mg via INTRAVENOUS
  Filled 2020-11-24 (×2): qty 2

## 2020-11-24 MED ORDER — FENTANYL CITRATE (PF) 250 MCG/5ML IJ SOLN
INTRAMUSCULAR | Status: AC
  Filled 2020-11-24: qty 5

## 2020-11-24 MED ORDER — FENTANYL CITRATE (PF) 100 MCG/2ML IJ SOLN
INTRAMUSCULAR | Status: AC
  Administered 2020-11-24: 50 ug via INTRAVENOUS
  Filled 2020-11-24: qty 2

## 2020-11-24 MED ORDER — LANCETS MISC: 1.0000 | Freq: Two times a day (BID) | Status: DC

## 2020-11-24 MED ORDER — HYDRALAZINE HCL 50 MG PO TABS
50.0000 mg | ORAL_TABLET | Freq: Two times a day (BID) | ORAL | Status: DC
  Administered 2020-11-24 – 2020-12-03 (×18): 50 mg via ORAL
  Filled 2020-11-24 (×8): qty 1
  Filled 2020-11-24: qty 5
  Filled 2020-11-24 (×9): qty 1

## 2020-11-24 MED ORDER — ALCOHOL PADS 70 % PADS: 1.0000 | MEDICATED_PAD | Freq: Two times a day (BID) | Status: DC

## 2020-11-24 MED ORDER — NITROGLYCERIN 0.4 MG SL SUBL: 0.4000 mg | SUBLINGUAL_TABLET | SUBLINGUAL | Status: DC | PRN

## 2020-11-24 MED ORDER — INSULIN ASPART 100 UNIT/ML ~~LOC~~ SOLN
0.0000 [IU] | Freq: Three times a day (TID) | SUBCUTANEOUS | Status: DC
  Administered 2020-11-25: 3 [IU] via SUBCUTANEOUS
  Administered 2020-11-25 – 2020-11-29 (×4): 2 [IU] via SUBCUTANEOUS
  Administered 2020-11-29: 11 [IU] via SUBCUTANEOUS
  Administered 2020-11-30 – 2020-12-01 (×4): 2 [IU] via SUBCUTANEOUS
  Administered 2020-12-01: 3 [IU] via SUBCUTANEOUS
  Administered 2020-12-01: 2 [IU] via SUBCUTANEOUS
  Administered 2020-12-02 – 2020-12-03 (×3): 3 [IU] via SUBCUTANEOUS

## 2020-11-24 MED ORDER — DEXAMETHASONE SODIUM PHOSPHATE 10 MG/ML IJ SOLN
INTRAMUSCULAR | Status: DC | PRN
  Administered 2020-11-24: 10 mg via INTRAVENOUS

## 2020-11-24 MED ORDER — ACETAMINOPHEN 650 MG RE SUPP: 650.0000 mg | RECTAL | Status: DC | PRN

## 2020-11-24 MED ORDER — GABAPENTIN 300 MG PO CAPS
300.0000 mg | ORAL_CAPSULE | Freq: Every day | ORAL | Status: DC
  Administered 2020-11-24 – 2020-12-02 (×9): 300 mg via ORAL
  Filled 2020-11-24 (×9): qty 1

## 2020-11-24 MED ORDER — CYCLOBENZAPRINE HCL 10 MG PO TABS
10.0000 mg | ORAL_TABLET | Freq: Three times a day (TID) | ORAL | Status: DC | PRN
  Administered 2020-11-25 – 2020-12-03 (×10): 10 mg via ORAL
  Filled 2020-11-24 (×12): qty 1

## 2020-11-24 MED ORDER — ONDANSETRON HCL 4 MG/2ML IJ SOLN: 4.0000 mg | Freq: Once | INTRAMUSCULAR | Status: DC | PRN

## 2020-11-24 MED ORDER — ALBUTEROL SULFATE HFA 108 (90 BASE) MCG/ACT IN AERS
2.0000 | INHALATION_SPRAY | Freq: Four times a day (QID) | RESPIRATORY_TRACT | Status: DC | PRN
  Filled 2020-11-24: qty 6.7

## 2020-11-24 MED ORDER — BUPIVACAINE HCL (PF) 0.25 % IJ SOLN
INTRAMUSCULAR | Status: DC | PRN
  Administered 2020-11-24: 10 mL

## 2020-11-24 MED ORDER — BACITRACIN ZINC 500 UNIT/GM EX OINT
TOPICAL_OINTMENT | CUTANEOUS | Status: AC
  Filled 2020-11-24: qty 28.35

## 2020-11-24 MED ORDER — PROPOFOL 10 MG/ML IV BOLUS
INTRAVENOUS | Status: AC
  Filled 2020-11-24: qty 20

## 2020-11-24 MED ORDER — LIDOCAINE-EPINEPHRINE 1 %-1:100000 IJ SOLN
INTRAMUSCULAR | Status: DC | PRN
  Administered 2020-11-24: 7 mL

## 2020-11-24 MED ORDER — ASPIRIN EC 81 MG PO TBEC
81.0000 mg | DELAYED_RELEASE_TABLET | Freq: Every morning | ORAL | Status: DC
  Administered 2020-11-25 – 2020-12-03 (×9): 81 mg via ORAL
  Filled 2020-11-24 (×9): qty 1

## 2020-11-24 MED ORDER — PHENYLEPHRINE HCL-NACL 10-0.9 MG/250ML-% IV SOLN
INTRAVENOUS | Status: DC | PRN
  Administered 2020-11-24: 25 ug/min via INTRAVENOUS

## 2020-11-24 MED ORDER — ACETAMINOPHEN 325 MG PO TABS
650.0000 mg | ORAL_TABLET | Freq: Three times a day (TID) | ORAL | Status: DC
  Administered 2020-11-24 – 2020-12-03 (×23): 650 mg via ORAL
  Filled 2020-11-24 (×25): qty 2

## 2020-11-24 MED ORDER — ACETAMINOPHEN 325 MG PO TABS
650.0000 mg | ORAL_TABLET | ORAL | Status: DC | PRN
  Administered 2020-11-24 – 2020-11-30 (×5): 650 mg via ORAL
  Filled 2020-11-24 (×2): qty 2

## 2020-11-24 MED ORDER — LACTATED RINGERS IV SOLN: INTRAVENOUS | Status: DC

## 2020-11-24 MED ORDER — THROMBIN 20000 UNITS EX SOLR
CUTANEOUS | Status: AC
  Filled 2020-11-24: qty 20000

## 2020-11-24 SURGICAL SUPPLY — 72 items
ADH SKN CLS APL DERMABOND .7 (GAUZE/BANDAGES/DRESSINGS) ×1
APL SKNCLS STERI-STRIP NONHPOA (GAUZE/BANDAGES/DRESSINGS) ×1
BAND INSRT 18 STRL LF DISP RB (MISCELLANEOUS)
BAND RUBBER #18 3X1/16 STRL (MISCELLANEOUS) IMPLANT
BENZOIN TINCTURE PRP APPL 2/3 (GAUZE/BANDAGES/DRESSINGS) ×3 IMPLANT
BIT DRILL 3.5 SHORT (BIT) ×2 IMPLANT
BIT DRILL 3.5MM SHORT (BIT) ×1
BLADE CLIPPER SURG (BLADE) ×3 IMPLANT
BLADE SURG 11 STRL SS (BLADE) ×3 IMPLANT
BONE VIVIGEN FORMABLE 5.4CC (Bone Implant) ×3 IMPLANT
BUR MATCHSTICK NEURO 3.0 LAGG (BURR) ×3 IMPLANT
CANISTER SUCT 3000ML PPV (MISCELLANEOUS) ×3 IMPLANT
CAP LOCKING (Cap) ×5 IMPLANT
CARTRIDGE OIL MAESTRO DRILL (MISCELLANEOUS) ×1 IMPLANT
CLOSURE WOUND 1/2 X4 (GAUZE/BANDAGES/DRESSINGS) ×1
COVER WAND RF STERILE (DRAPES) IMPLANT
DECANTER SPIKE VIAL GLASS SM (MISCELLANEOUS) ×3 IMPLANT
DERMABOND ADVANCED (GAUZE/BANDAGES/DRESSINGS) ×2
DERMABOND ADVANCED .7 DNX12 (GAUZE/BANDAGES/DRESSINGS) ×1 IMPLANT
DIFFUSER DRILL AIR PNEUMATIC (MISCELLANEOUS) ×3 IMPLANT
DRAPE C-ARM 42X72 X-RAY (DRAPES) ×6 IMPLANT
DRAPE LAPAROTOMY 100X72 PEDS (DRAPES) ×3 IMPLANT
DRAPE MICROSCOPE LEICA (MISCELLANEOUS) IMPLANT
DRAPE SURG 17X23 STRL (DRAPES) ×6 IMPLANT
DRSG OPSITE POSTOP 4X6 (GAUZE/BANDAGES/DRESSINGS) ×3 IMPLANT
DURAPREP 26ML APPLICATOR (WOUND CARE) ×3 IMPLANT
ELECT REM PT RETURN 9FT ADLT (ELECTROSURGICAL) ×3
ELECTRODE REM PT RTRN 9FT ADLT (ELECTROSURGICAL) ×1 IMPLANT
GAUZE 4X4 16PLY RFD (DISPOSABLE) IMPLANT
GAUZE SPONGE 4X4 12PLY STRL (GAUZE/BANDAGES/DRESSINGS) ×3 IMPLANT
GLOVE BIO SURGEON STRL SZ7 (GLOVE) IMPLANT
GLOVE BIO SURGEON STRL SZ8 (GLOVE) ×3 IMPLANT
GLOVE EXAM NITRILE XL STR (GLOVE) IMPLANT
GLOVE INDICATOR 8.5 STRL (GLOVE) ×3 IMPLANT
GLOVE SURG POLYISO LF SZ6 (GLOVE) ×3 IMPLANT
GLOVE SURG POLYISO LF SZ7 (GLOVE) ×12 IMPLANT
GLOVE SURG UNDER POLY LF SZ6.5 (GLOVE) ×3 IMPLANT
GLOVE SURG UNDER POLY LF SZ7 (GLOVE) IMPLANT
GLOVE SURG UNDER POLY LF SZ7.5 (GLOVE) ×3 IMPLANT
GOWN STRL REUS W/ TWL LRG LVL3 (GOWN DISPOSABLE) ×2 IMPLANT
GOWN STRL REUS W/ TWL XL LVL3 (GOWN DISPOSABLE) ×2 IMPLANT
GOWN STRL REUS W/TWL 2XL LVL3 (GOWN DISPOSABLE) IMPLANT
GOWN STRL REUS W/TWL LRG LVL3 (GOWN DISPOSABLE) ×6
GOWN STRL REUS W/TWL XL LVL3 (GOWN DISPOSABLE) ×6
IMPL QUARTEX 3.5X12MM (Neuro Prosthesis/Implant) ×5 IMPLANT
IMPLANT QUARTEX 3.5X12MM (Neuro Prosthesis/Implant) ×15 IMPLANT
KIT BASIN OR (CUSTOM PROCEDURE TRAY) ×3 IMPLANT
KIT TURNOVER KIT B (KITS) ×3 IMPLANT
LOCKING CAP (Cap) ×15 IMPLANT
MARKER SKIN DUAL TIP RULER LAB (MISCELLANEOUS) ×3 IMPLANT
NEEDLE HYPO 25X1 1.5 SAFETY (NEEDLE) ×3 IMPLANT
NEEDLE SPNL 20GX3.5 QUINCKE YW (NEEDLE) IMPLANT
NS IRRIG 1000ML POUR BTL (IV SOLUTION) ×3 IMPLANT
OIL CARTRIDGE MAESTRO DRILL (MISCELLANEOUS) ×3
PACK LAMINECTOMY NEURO (CUSTOM PROCEDURE TRAY) ×3 IMPLANT
PAD ARMBOARD 7.5X6 YLW CONV (MISCELLANEOUS) ×9 IMPLANT
PIN MAYFIELD SKULL DISP (PIN) ×3 IMPLANT
ROD CVD QUARTEX 3.5X35 (Rod) ×6 IMPLANT
SPONGE LAP 4X18 RFD (DISPOSABLE) IMPLANT
SPONGE SURGIFOAM ABS GEL 100 (HEMOSTASIS) ×3 IMPLANT
STAPLER VISISTAT 35W (STAPLE) ×3 IMPLANT
STRIP CLOSURE SKIN 1/2X4 (GAUZE/BANDAGES/DRESSINGS) ×2 IMPLANT
SUT ETHILON 4 0 PS 2 18 (SUTURE) IMPLANT
SUT VIC AB 0 CT1 18XCR BRD8 (SUTURE) ×1 IMPLANT
SUT VIC AB 0 CT1 8-18 (SUTURE) ×3
SUT VIC AB 2-0 CT1 18 (SUTURE) ×3 IMPLANT
SUT VIC AB 4-0 PS2 27 (SUTURE) ×3 IMPLANT
SYR CONTROL 10ML LL (SYRINGE) ×3 IMPLANT
TOWEL GREEN STERILE (TOWEL DISPOSABLE) ×3 IMPLANT
TOWEL GREEN STERILE FF (TOWEL DISPOSABLE) ×3 IMPLANT
TRAY FOLEY MTR SLVR 16FR STAT (SET/KITS/TRAYS/PACK) IMPLANT
WATER STERILE IRR 1000ML POUR (IV SOLUTION) ×3 IMPLANT

## 2020-11-24 NOTE — H&P (Signed)
[name] Pratt is an 80 y.o. male.   Chief Complaint: Weakness in his hands numbness tingling difficulty walking HPI: 80 year old gentleman with a history of cervical spondylitic myelopathy we performed an ACDF to decompress the anterior part of the spinal cord he still has some posterior compression picked up on follow-up imaging so I recommended posterior cervical decompression with lateral mass fixation I extensively over the risks and benefits of that procedure with him as well as perioperative course expectations of outcome and alternatives of surgery and he understands and agrees to proceed forward.  Past Medical History:  Diagnosis Date  . Aortic atherosclerosis ([place]) 03/05/2020  . Arthritis   . Cancer ([place])    skin cancer  . Carotid stenosis    a. Carotid U/S 6/96: LICA < 29%, RICA 52-84%;  b.  Carotid U/S 1/32:  RICA 44-01%; LICA 0-27%; f/u 1 year  . Chest pain     Myoview in 2008 was normal.  Echo 7/09: EF 60%, normal wall motion, mild LVH, mild LAE.  [name] Complication of anesthesia    "stomach does not wake up"  . COPD (chronic obstructive pulmonary disease) ([place])   . Emphysema, unspecified ([place]) 03/05/2020  . GERD (gastroesophageal reflux disease)   . Gout   . History of chicken pox   . History of hemorrhoids   . History of kidney stones   . Hyperlipidemia    under control  . Hypertension    under control  . Hypothyroidism   . Ileus ([place])   . Inguinal hernia   . OSA (obstructive sleep apnea)   . Pancreatitis   . PONV (postoperative nausea and vomiting)   . Rosacea   . 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    Past Surgical History:  Procedure Laterality Date  . ABDOMINAL HERNIA REPAIR  07/15/12   Dr [name]  . ANTERIOR CERVICAL DECOMP/DISCECTOMY FUSION N/A 07/09/2020   Procedure: ANTERIOR CERVICAL DECOMPRESSION AND FUSION CERVICAL THREE-FOUR.;  Surgeon: [name], MD;  Location: [place];  Service: Neurosurgery;   Laterality: N/A;  anterior  . BROW LIFT  05/07/01  . CYSTOSCOPY WITH URETEROSCOPY AND STENT PLACEMENT Right 01/28/2014   Procedure: CYSTOSCOPY WITH URETEROSCOPY, BASKET RETRIVAL AND  STENT PLACEMENT;  Surgeon: [name], MD;  Location: [facility];  Service: Urology;  Laterality: Right;  . epidural injections     multiple procedures  . [place]   multiple times  . EYE SURGERY Bilateral 03/25/10, 2012   cataract removal  . HEMORRHOID SURGERY  2014  . HIATAL HERNIA REPAIR  12/21/93  . HOLMIUM LASER APPLICATION Right 2/53/6644   Procedure: HOLMIUM LASER APPLICATION;  Surgeon: [name], MD;  Location: [facility];  Service: Urology;  Laterality: Right;  . INGUINAL HERNIA REPAIR Right 07/15/12   Dr [name], x2  . JOINT REPLACEMENT  07/25/04   right knee  . JOINT REPLACEMENT  07/13/10   left knee  . KNEE SURGERY  1995  . LAPAROSCOPIC CHOLECYSTECTOMY  09/20/06   with intraoperative cholangiogram and right inguinal herniorrhaphy with mesh   . LIGAMENT REPAIR Left 07/2013   shoulder  . LITHOTRIPSY     x2  . NASAL SEPTUM SURGERY  1975  . REFRACTIVE SURGERY Bilateral   . 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 SKIN CANCER DESTRUCTION     nose, ear  . TONSILLECTOMY  as child  . TOTAL HIP ARTHROPLASTY Left   .  URETHRAL DILATION  1991   [name]    Family History  Problem Relation Age of Onset  . Cancer Mother   . Hypertension Other   . Cancer Other   . Osteoarthritis Other    Social History:  reports that he quit smoking about 42 years ago. His smoking use included cigarettes. He quit after 11.00 years of use. He has never used smokeless tobacco. He reports previous alcohol use. He reports that he does not use drugs.  Allergies:  Allergies  Allergen Reactions  . Atenolol Other (See Comments)    bradycardia  . Elastic Bandages & [Zinc] Other (See Comments)    Teflon bandages - Irritation  . Pravastatin     Muscle cramps  . Sildenafil     Tachycardia    .  Metoclopramide Hcl Anxiety    Medications Prior to Admission  Medication Sig Dispense Refill  . acetaminophen (TYLENOL) 650 MG CR tablet Take 650-1,300 mg by mouth See admin instructions. Take 1300 mg in the morning and 650 g at bedtime    . albuterol (VENTOLIN HFA) 108 (90 Base) MCG/ACT inhaler Inhale 2 puffs into the lungs every 6 (six) hours as needed for shortness of breath. 1 each 3  . allopurinol (ZYLOPRIM) 100 MG tablet TAKE 1 TABLET TWICE DAILY (Patient taking differently: Take 100 mg by mouth 2 (two) times daily.) 180 tablet 1  . amLODipine (NORVASC) 5 MG tablet Take 1 tablet (5 mg total) by mouth daily. 90 tablet 1  . Artificial Tear Solution (SOOTHE XP) SOLN Place 1 drop into both eyes 2 (two) times daily as needed (Dry eyes).    [name] aspirin EC 81 MG tablet Take 81 mg by mouth every morning.    . Cholecalciferol (VITAMIN D) 50 MCG (2000 UT) CAPS Take 2,000 Units by mouth daily.    . Cranberry 500 MG TABS Take 500 mg by mouth daily.     . cycloSPORINE (RESTASIS) 0.05 % ophthalmic emulsion Place 1 drop into both eyes 2 (two) times daily.    . diclofenac Sodium (VOLTAREN) 1 % GEL Apply 2 g topically daily as needed (Hand pain).    [name] gabapentin (NEURONTIN) 300 MG capsule TAKE 1 CAPSULE TWO TO THREE TIMES DAILY AS NEEDED (Patient taking differently: Take 300 mg by mouth at bedtime.) 180 capsule 1  . Homeopathic Products (THERAWORX RELIEF) FOAM Apply 1 application topically 2 (two) times daily as needed (Pain).    . hydrALAZINE (APRESOLINE) 50 MG tablet Take 50 mg by mouth 2 (two) times daily.    [name] levothyroxine (SYNTHROID) 150 MCG tablet TAKE 1 TABLET ONE TIME DAILY ON 6 DAYS PER WEEK AND 1/2 TABLET ON 1 DAY PER WEEK (Patient taking differently: Take 150 mcg by mouth See admin instructions. Take 150 mg once a day Monday - Saturday and 75 mg on Sunday during the day) 78 tablet 1  . losartan (COZAAR) 100 MG tablet Take 1 tablet (100 mg total) by mouth daily. (Patient taking differently: No sig  reported) 90 tablet 1  . multivitamin (THERAGRAN) per tablet Take 1 tablet by mouth daily.    . nitroGLYCERIN (NITROSTAT) 0.4 MG SL tablet Place 1 tablet (0.4 mg total) under the tongue every 5 (five) minutes as needed for chest pain. 25 tablet 3  . Omega-3 Fatty Acids (FISH OIL) 1200 MG CAPS Take 1,200 mg by mouth daily.    . rosuvastatin (CRESTOR) 5 MG tablet TAKE 1 TABLET EVERY DAY 90 tablet 0  . sodium  chloride (OCEAN) 0.65 % nasal spray Place 1 spray into both nostrils at bedtime as needed for congestion.    . Alcohol Swabs (ALCOHOL PADS) 70 % PADS 1 packet by Does not apply route 2 (two) times daily. 100 each 1  . blood glucose meter kit and supplies Dispense based on patient and insurance preference. Use up to four times daily as directed. (FOR ICD-10 E10.9, E11.9). 1 each 0  . colchicine 0.6 MG tablet Take 1 tablet by mouth daily as needed (Gout).    [name] docusate sodium (COLACE) 100 MG capsule Take 1 capsule (100 mg total) by mouth 2 (two) times daily as needed for mild constipation. (Patient taking differently: Take 100 mg by mouth daily as needed for mild constipation or moderate constipation.) 10 capsule 0  . glucose blood test strip TRUE Metrix Blood Glucose Test Strip, use as instructed 100 each 12  . Lancets MISC 1 each by Does not apply route 2 (two) times daily. TRUE matrix lancets 100 each 3  . Menthol, Topical Analgesic, (ZIMS MAX-FREEZE EX) Apply 1 application topically daily as needed (pain).      Results for orders placed or performed during the hospital encounter of 11/22/20 (from the past 48 hour(s))  Type and screen [place]     Status: None   Collection Time: 11/22/20  1:40 PM  Result Value Ref Range   ABO/RH(D) O POS    Antibody Screen NEG    Sample Expiration 12/06/2020,2359    Extend sample reason      NO TRANSFUSIONS OR PREGNANCY IN THE PAST 3 MONTHS Performed at [facility], [address], [place], [place] [address]    No results  found.  Review of Systems  Musculoskeletal: Positive for gait problem, myalgias and neck pain.  Neurological: Positive for weakness and numbness.    Blood pressure (!) 170/76, pulse (!) 57, temperature 97.8 F (36.6 C), temperature source Oral, resp. rate 18, height _0  (1.676 m), weight 69.9 kg, SpO2 97 %. Physical Exam HENT:     Head: Normocephalic.     Right Ear: Tympanic membrane normal.     Nose: Nose normal.     Mouth/Throat:     Mouth: Mucous membranes are moist.  Eyes:     Pupils: Pupils are equal, round, and reactive to light.  Cardiovascular:     Rate and Rhythm: Normal rate.     Pulses: Normal pulses.  Pulmonary:     Effort: Pulmonary effort is normal.  Abdominal:     General: Abdomen is flat.  Musculoskeletal:        General: Normal range of motion.  Skin:    General: Skin is warm.  Neurological:     Comments: Patient is awake and alert has weak hand intrinsics and grips bilaterally triceps of 4+ is good exam consistent with a cervical spondylitic myelopathy      Assessment/Plan 80 year old presents for posterior cervical decompression lateral mass fixation C3-4 C4-5  Elaina Hoops, MD 11/24/2020, 1:36 PM

## 2020-11-24 NOTE — Progress Notes (Signed)
PHARMACIST - PHYSICIAN ORDER COMMUNICATION  CONCERNING: P&T Medication Policy on Herbal Medications  DESCRIPTION:  This patient's order for:  Cranberry 500 mg and Theraworx Relief Foam  has been noted.  This product(s) is classified as an "herbal" or natural product. Due to a lack of definitive safety studies or FDA approval, nonstandard manufacturing practices, plus the potential risk of unknown drug-drug interactions while on inpatient medications, the Pharmacy and Therapeutics Committee does not permit the use of "herbal" or natural products of this type within [facility].   ACTION TAKEN: The pharmacy department is unable to verify this order at this time and your patient has been informed of this safety policy. Please reevaluate patient's clinical condition at discharge and address if the herbal or natural product(s) should be resumed at that time.

## 2020-11-24 NOTE — Progress Notes (Signed)
Patient transferred to [phone] via bed. No signs of distress during transfer. Report given to [employer]. Will follow up.[name]

## 2020-11-24 NOTE — Transfer of Care (Signed)
Immediate Anesthesia Transfer of Care Note  Patient: [name] Pratt  Procedure(s) Performed: Posterior Cervical Laminectomy Cervical three-four, Cervical four-five with lateral mass fusion (N/A Neck)  Patient Location: PACU  Anesthesia Type:General  Level of Consciousness: drowsy and patient cooperative  Airway & Oxygen Therapy: Patient Spontanous Breathing and Patient connected to face mask oxygen  Post-op Assessment: Report given to RN, Post -op Vital signs reviewed and stable and Patient moving all extremities X 4  Post vital signs: Reviewed  Last Vitals:  Vitals Value Taken Time  BP 159/77 11/24/20 1647  Temp    Pulse 85 11/24/20 1652  Resp 16 11/24/20 1652  SpO2 99 % 11/24/20 1652  Vitals shown include unvalidated device data.  Last Pain:  Vitals:   11/24/20 1222  TempSrc:   PainSc: 0-No pain         Complications: No complications documented.

## 2020-11-24 NOTE — Anesthesia Procedure Notes (Signed)
Procedure Name: Intubation Date/Time: 11/24/2020 2:30 PM Performed by: Janene Harvey, CRNA Pre-anesthesia Checklist: Patient identified, Emergency Drugs available, Suction available and Patient being monitored Patient Re-evaluated:Patient Re-evaluated prior to induction Oxygen Delivery Method: Circle system utilized Preoxygenation: Pre-oxygenation with 100% oxygen Induction Type: IV induction Ventilation: Mask ventilation without difficulty Laryngoscope Size: Glidescope and 4 Grade View: Grade I Tube type: Oral Tube size: 7.5 mm Number of attempts: 1 Airway Equipment and Method: Stylet and Oral airway Placement Confirmation: ETT inserted through vocal cords under direct vision,  positive ETCO2 and breath sounds checked- equal and bilateral Secured at: 22 cm Tube secured with: Tape Dental Injury: Teeth and Oropharynx as per pre-operative assessment

## 2020-11-24 NOTE — Op Note (Signed)
Preoperative diagnosis: Cervical spondylitic myelopathy from severe cervical stenosis C3-4 C4-5  Postoperative diagnosis: Same  Procedure: #1 decompressive cervical laminectomy C3-4 C4-5 with partial medial facetectomies and foraminotomies of the C4 and C5 nerve roots  2.  Posterior lateral fusion with lateral mass screws placed at C3, C4, and C5 on the right and C3 and C5 on the left.  Utilizing the globus ellipse lateral mass screw system  3.  Posterior lateral arthrodesis C3-4 C4-5 utilizing locally harvested autograft mixed withvivigen  Surgeon: Dominica Severin Codi Folkerts  Anesthesia: General  EBL: Minimal  HPI: 80 year old gentleman with cervical spondylitic myelopathy underwent anterior cervical decompression discectomy and fusion at C3-4 and got significantly better however has had persistent weakness in his hands numbness tingling his fingers follow-up imaging showed persistence cord compression posteriorly so I recommend posterior cervical laminectomy and lateral mass fixation from C3-C5.  I extensively went over the risks and benefits of that operation with him as well as perioperative course expectations of outcome and alternatives of surgery and he understood and agreed to proceed forward.  Operative procedure: Patient was brought into the OR was induced under general anesthesia positioned prone in pins the backside of his head neck was shaved prepped and draped in routine sterile fashion a midline incision was drawn out and after infiltration of 10 cc lidocaine with epi incision was made subperiosteal dissection was carried out on the lamina from C2 down to the top of C6.  Identified and dissected out the lateral masses of C3, C4, and C5.  Intraoperative x-ray confirmed defecation appropriate level.  So then attention was first taken to screw placement utilizing pilot holes in the inferomedial quadrant drilled holes out to the superolateral quadrant the holes with improved tapped and screws were placed  the C4 lateral mass broke through twice I elected to leave that when out on the patient's left side.  I did place screws at C3 and C5 on the left and C4 C3 and C5 on the right all screws had excellent purchase then performed laminectomy performed remove the spinous process with Leksell rongeur of C3-C4 and the superior aspect of C5 drilled down the lamina to thinned it out marching up the lateral gutters with a 2 mm Kerrison I then unroofed the central lamina decompressing central cord.  There was marked hourglass compression from marked facet arthropathy at both C3-4 and C4-5 this was teased off the dura removed in piecemeal fashion with a 2 Miller Kerrison punch I then explored the foramen of C4 and C5 nerve roots to confirm decompression there was an extensive mount of inflammatory tissue causing compression of the proximal C5 nerve roots this was all removed at the end of the decompression there is no further stenosis either centrally or foraminally I then copiously irrigated the wound aggressively decorticated the lateral masses and the facet joints at C3-4 and 5 then placed the locally harvested autograft mixed in the facet joints and along the lateral masses at C3-4 and C4-5.  Then attached rods anchored the knots in place torqued everything down placed Gelfoam in the dura and closed the wound in layers with interrupted Vicryl in a running 4 subcuticular Dermabond benzoin Steri-Strips and sterile dressing was applied patient recovery in stable condition.  At the end the case all needle counts and sponge counts were correct.

## 2020-11-25 ENCOUNTER — Encounter (HOSPITAL_COMMUNITY): Payer: Self-pay | Admitting: Neurosurgery

## 2020-11-25 LAB — GLUCOSE, CAPILLARY
Glucose-Capillary: 110 mg/dL — ABNORMAL HIGH (ref 70–99)
Glucose-Capillary: 121 mg/dL — ABNORMAL HIGH (ref 70–99)
Glucose-Capillary: 188 mg/dL — ABNORMAL HIGH (ref 70–99)
Glucose-Capillary: 116 mg/dL — ABNORMAL HIGH (ref 70–99)

## 2020-11-25 MED ORDER — PANTOPRAZOLE SODIUM 40 MG PO TBEC
40.0000 mg | DELAYED_RELEASE_TABLET | Freq: Every day | ORAL | Status: DC
  Administered 2020-11-25 – 2020-12-02 (×8): 40 mg via ORAL
  Filled 2020-11-25 (×8): qty 1

## 2020-11-25 NOTE — Evaluation (Signed)
Physical Therapy Evaluation Patient Details Name: [name] Pratt MRN: [id number] DOB: 1941/02/18 Today's Date: 11/25/2020   History of Present Illness  Pt is 80 year old male admitted 3/16 following posterior cervical decompression fusion. He previously had ACDF to decompress the anterior part of the spinal cord due to cervical spondylitic myelopathy, but required additional surgery. history of HLD, left bruit , HTN and COPD.   Clinical Impression  Pt fully participated in evaluation. Pt states he was I with intermittent use of RW PTA. Pt is now requiring assist for bed mobility and transfers due to pain, weakness and maintaining precautions. Pt will benefit from skilled PT to address deficits to maximize independence with functional mobility prior to discharge.      Follow Up Recommendations Home health PT;Supervision - Intermittent    Equipment Recommendations       Recommendations for Other Services       Precautions / Restrictions Precautions Precautions: Back Precaution Booklet Issued: No Precaution Comments: bending, lifting twisting; no brace needed      Mobility  Bed Mobility               General bed mobility comments: pt seen sitting in recliner, but states he needed help getting out of the bed by RN    Transfers Overall transfer level: Needs assistance Equipment used: Rolling walker (2 wheeled) Transfers: Sit to/from Stand Sit to Stand: Mod assist         General transfer comment: mod A to power up to standing and assist wtih anterior weight shift  Ambulation/Gait Ambulation/Gait assistance: Min guard Gait Distance (Feet): 200 Feet Assistive device: Rolling walker (2 wheeled) Gait Pattern/deviations: Step-through pattern;Decreased stride length;Trunk flexed Gait velocity: decreased   General Gait Details: flexed at hips  Stairs Stairs: Yes Stairs assistance: Min assist Stair Management: Two rails;Step to pattern Number of Stairs:  4 General stair comments: leading up wtih L and down with R  Wheelchair Mobility    Modified Rankin (Stroke Patients Only)       Balance Overall balance assessment: Needs assistance Sitting-balance support: Feet supported Sitting balance-Leahy Scale: Fair     Standing balance support: Bilateral upper extremity supported Standing balance-Leahy Scale: Poor                               Pertinent Vitals/Pain Pain Assessment: 0-10 Pain Score: 4  Pain Location: neck Pain Descriptors / Indicators: Throbbing    Home Living Family/patient expects to be discharged to:: Private residence Living Arrangements: Spouse/significant other Available Help at Discharge: Available 24 hours/day;Family Type of Home: House Home Access: Stairs to enter Entrance Stairs-Rails: Psychiatric nurse of Steps: 6 Home Layout: Multi-level Home Equipment: Shower seat;Cane - single point;Walker - 2 wheels      Prior Function Level of Independence: Independent         Comments: uses RW when feels weak     Hand Dominance   Dominant Hand: Right    Extremity/Trunk Assessment   Upper Extremity Assessment Upper Extremity Assessment: Defer to OT evaluation    Lower Extremity Assessment Lower Extremity Assessment: Generalized weakness       Communication   Communication: No difficulties  Cognition Arousal/Alertness: Awake/alert Behavior During Therapy: WFL for tasks assessed/performed Overall Cognitive Status: Within Functional Limits for tasks assessed  General Comments General comments (skin integrity, edema, etc.): VSS; slight R lateral flank pain under armpit, RN made aware, lasted 10 seconds then disappeared, Pt stated felt like muscle spasm    Exercises     Assessment/Plan    PT Assessment Patient needs continued PT services  PT Problem List Decreased strength;Decreased mobility;Decreased  coordination;Decreased activity tolerance;Decreased balance;Decreased knowledge of precautions       PT Treatment Interventions DME instruction;Therapeutic exercise;Gait training;Balance training;Stair training;Functional mobility training;Therapeutic activities;Patient/family education    PT Goals (Current goals can be found in the Care Plan section)  Acute Rehab PT Goals Patient Stated Goal: I want to get stronger PT Goal Formulation: With patient Time For Goal Achievement: 12/09/20 Potential to Achieve Goals: Good    Frequency Min 5X/week   Barriers to discharge        Co-evaluation               AM-PAC PT "6 Clicks" Mobility  Outcome Measure Help needed turning from your back to your side while in a flat bed without using bedrails?: A Little Help needed moving from lying on your back to sitting on the side of a flat bed without using bedrails?: A Little Help needed moving to and from a bed to a chair (including a wheelchair)?: A Little Help needed standing up from a chair using your arms (e.g., wheelchair or bedside chair)?: A Lot Help needed to walk in hospital room?: A Little Help needed climbing 3-5 steps with a railing? : A Little 6 Click Score: 17    End of Session Equipment Utilized During Treatment: Gait belt Activity Tolerance: Patient tolerated treatment well Patient left: in chair;with call bell/phone within reach;with chair alarm set Nurse Communication: Mobility status PT Visit Diagnosis: Unsteadiness on feet (R26.81);Other abnormalities of gait and mobility (R26.89);Muscle weakness (generalized) (M62.81)    Time: 2229-7989 PT Time Calculation (min) (ACUTE ONLY): 20 min   Charges:   PT Evaluation $PT Eval Moderate Complexity: 1 Mod          Lyanne Co, DPT Acute Rehabilitation Services [phone]  [place] 11/25/2020, 12:51 PM

## 2020-11-25 NOTE — TOC Initial Note (Signed)
Transition of Care [facility]) - Initial/Assessment Note    Patient Details  Name: [name] Pratt MRN: [id number] Date of Birth: [birth year]  Transition of [facility]) CM/SW Contact:    Pollie Friar, RN Phone Number: 11/25/2020, 3:03 PM  Clinical Narrative:                 Pt lives at home with spouse that can provide needed assistance.  Pt denies any home medication or transportation issues.  Pt has all needed DME at home.  Recommendations for [facility] services. CM met with the patient and spouse and they have no preference for [facility] services. HH arranged through [facility]. Cory with [facility] accepted the referral. TOC following for further d/c needs.   Expected Discharge Plan: [place] Barriers to Discharge: Continued Medical Work up   Patient Goals and CMS Choice   CMS Medicare.gov Compare Post Acute Care list provided to:: Patient Choice offered to / list presented to : [facility]  Expected Discharge Plan and Services Expected Discharge Plan: [place]   Discharge Planning Services: CM Consult Post Acute Care Choice: [place] arrangements for the past 2 months: [place]: PT,OT [place]: [place] Date [facility] Agency Contacted: 11/25/20   Representative spoke with at [place]: Tommi Rumps  Prior Living Arrangements/Services Living arrangements for the past 2 months: [place] Lives with:: Spouse Patient language and need for interpreter reviewed:: Yes Do you feel safe going back to the place where you live?: Yes      Need for Family Participation in Patient Care: Yes (Comment) Care giver support system in place?: Yes (comment) Current home services: DME (cpap/ walker/ cane/ 3 in 1) Criminal Activity/Legal Involvement Pertinent to Current Situation/Hospitalization: No - Comment as needed  Activities of Daily Living      Permission Sought/Granted                   Emotional Assessment Appearance:: Appears stated age Attitude/Demeanor/Rapport: Engaged Affect (typically observed): Accepting Orientation: : Oriented to Self,Oriented to Place,Oriented to  Time,Oriented to Situation   Psych Involvement: No (comment)  Admission diagnosis:  Myelopathy ([place]) [G95.9] Patient Active Problem List   Diagnosis Date Noted  . Myelopathy ([place]) 11/24/2020  . Cystoid macular edema of both eyes 07/28/2020  . Status post cervical spinal fusion 07/09/2020  . History of total bilateral knee replacement 07/08/2020  . Aortic atherosclerosis ([place]) 03/05/2020  . Emphysema, unspecified ([place]) 03/05/2020  . SIRS (systemic inflammatory response syndrome) ([place]) 03/04/2020  . Heme positive stool 02/29/2020  . Leg cramps 11/13/2019  . Tibialis anterior tendon tear, nontraumatic 11/13/2019  . 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 CPAP (continuous positive airway pressure) dependence 03/25/2018  . 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 Acute idiopathic gout of hand 03/25/2018  . Erectile dysfunction 06/20/2017  . 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 Peripheral neuropathy 06/19/2016  . Hearing loss 02/02/2016  . 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 Allergic rhinitis 02/02/2016  . Atypical chest pain 11/24/2015  . Preventative health care 11/24/2015  . Onychomycosis 06/30/2015  . Degenerative disc disease, lumbar 06/30/2015  . Other allergic rhinitis 02/18/2015  . Deviated nasal septum 02/18/2015  . OSA on CPAP 02/18/2015  .  RLS (restless legs syndrome) 02/18/2015  . GERD (gastroesophageal reflux disease) 09/18/2014  . Hyperglycemia 03/03/2014  . Sleep apnea with use of continuous positive airway pressure (CPAP) 07/15/2013  . Carotid stenosis 02/24/2013  . Nonspecific abnormal electrocardiogram (ECG) (EKG) 01/06/2013  . DJD (degenerative joint disease) of hip 12/24/2012  . Lower GI bleed 12/12/2012   . Dyspnea 06/12/2011  . Hypothyroidism 05/13/2009  . HYPERCHOLESTEROLEMIA 05/13/2009  . Hyperlipidemia 05/13/2009  . 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  . Essential hypertension 05/13/2009  . INGUINAL HERNIA 05/13/2009  . HIATAL HERNIA 05/13/2009  . NEPHROLITHIASIS 05/13/2009  . ROSACEA 05/13/2009   PCP:  [name], NP Pharmacy:   [place], [place] [address] Phone: [phone] Fax: [facility] [place], [place] [address] Phone: [phone] Fax: [phone]     Social Determinants of Health (SDOH) Interventions    Readmission Risk Interventions No flowsheet data found.

## 2020-11-25 NOTE — Progress Notes (Signed)
Pt w/o indwelling cath upon arrival to 3w38. Per Renetta Chalk (chrg nurse) on 3c."pt has voided since indwelling cath removed". Pt refusing to amb at all d/t pain even post pain management and being educated on the importance of ambulating.

## 2020-11-25 NOTE — Progress Notes (Signed)
Per Dr. Zada Finders, V.O."1x order to In and Out cath". D/t bladder scan 821cc.

## 2020-11-25 NOTE — Evaluation (Signed)
Occupational Therapy Evaluation Patient Details Name: [name] Pratt MRN: [id number] DOB: [birth year] Today's Date: 11/25/2020    History of Present Illness 80 y.o. 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 PMHx significant for ACDF C3-4 on 07/09/20, skin cancer, COPD, HLD, HTN, OSA, bilateral TKA and THA.   Clinical Impression   PTA patient was living with his spouse in a private residence and was ambulating in home with RW vs SPC. Patient currently presents slightly below baseline level of function demonstrating Min guard to Min A grossly for observed ADLs with use of RW and occasional cues for adherence to back precautions. OT provided education on spinal precautions, home set-up to maximize safety and independence with self-care tasks, and acquisition/use of AE. Patient reports using some LB AE at baseline for LB dressing tasks. Patient expressed verbal understanding but would benefit from continued education. Given CLOF and PLOF, patient would also benefit from continued acute OT services to maximize safety and independence with self-care tasks in prep for safe d/c home. Patient reports that his wife is able to provide 24hr supervision/assist upon return home.     Follow Up Recommendations  Home health OT;Supervision/Assistance - 24 hour    Equipment Recommendations  None recommended by OT    Recommendations for Other Services       Precautions / Restrictions Precautions Precautions: Back Precaution Booklet Issued: Yes (comment) Precaution Comments: Reviewed spinal precautions. Patient able to recall 3/3 with increased time at conclusion of session. Would benefit from continued education. Required Braces or Orthoses:  (No brace needed) Restrictions Weight Bearing Restrictions: No      Mobility Bed Mobility Overal bed mobility: Needs Assistance Bed Mobility: Rolling;Sidelying to  Sit Rolling: Supervision Sidelying to sit: Supervision       General bed mobility comments: Supervision A for all parts of bed mobility for safety. Cues for technique/hand placement.    Transfers Overall transfer level: Needs assistance Equipment used: Rolling walker (2 wheeled) Transfers: Sit to/from Stand Sit to Stand: Min guard;Min assist         General transfer comment: Min guar to Min A for sit to stand from elevated EOB. Cues for hand placement on RW.    Balance Overall balance assessment: Needs assistance Sitting-balance support: Feet supported Sitting balance-Leahy Scale: Good     Standing balance support: Bilateral upper extremity supported Standing balance-Leahy Scale: Fair Standing balance comment: Able to maintain static standing balance without UE support and supervision A for safety. Has forward head/trunk requiring cues to correct.                           ADL either performed or assessed with clinical judgement   ADL Overall ADL's : Needs assistance/impaired Eating/Feeding: Set up;Sitting               Upper Body Dressing : Set up;Sitting Upper Body Dressing Details (indicate cue type and reason): Donned posterior hospital gown. Lower Body Dressing: Minimal assistance;Sit to/from stand Lower Body Dressing Details (indicate cue type and reason): Requires assist to don footwear. Unable to attain/maintain figure-4 position. Reports use of AE at home to don footwear. Assist requried for donning ted hose. Toilet Transfer: Designer, television/film set Details (indicate cue type and reason): Simulated with transfer to recliner.         Functional mobility during ADLs: Min guard;Rolling walker General ADL Comments: Patient limited by BUE deficits including  decreased coordination/sensation.     Vision Patient Visual Report: No change from baseline       Perception     Praxis      Pertinent Vitals/Pain Pain Assessment: 0-10 Pain  Score: 8  Pain Location: R aspect of cervical neck Pain Descriptors / Indicators: Aching;Throbbing;Tender Pain Intervention(s): Limited activity within patient's tolerance;Monitored during session;Repositioned     Hand Dominance Right   Extremity/Trunk Assessment Upper Extremity Assessment Upper Extremity Assessment: Generalized weakness;RUE deficits/detail;LUE deficits/detail RUE Deficits / Details: AROM WFL. Did not assess greater than 90 degrees shoulder flexion. Continued numbness/tingling in bilateral hands (better since prior to surgery 06/2020). RUE Sensation: decreased light touch RUE Coordination: decreased fine motor;decreased gross motor LUE Deficits / Details: AROM WFL. Did not assess greater than 90 degrees shoulder flexion. Continued numbness/tingling in bilateral hands (better since prior to surgery 06/2020). LUE Sensation: decreased light touch LUE Coordination: decreased fine motor;decreased gross motor   Lower Extremity Assessment Lower Extremity Assessment: Generalized weakness   Cervical / Trunk Assessment Cervical / Trunk Assessment: Other exceptions Cervical / Trunk Exceptions: s/p cervical surgery   Communication Communication Communication: No difficulties   Cognition Arousal/Alertness: Awake/alert Behavior During Therapy: WFL for tasks assessed/performed Overall Cognitive Status: Impaired/Different from baseline Area of Impairment: Memory                     Memory: Decreased short-term memory         General Comments: Mild short-term memory deficits noted. No family present at bedside to confirm baseline cognition.   General Comments  VSS; slight R lateral flank pain under armpit, RN made aware, lasted 10 seconds then disappeared, Pt stated felt like muscle spasm    Exercises     Shoulder Instructions      Home Living Family/patient expects to be discharged to:: Private residence Living Arrangements: Spouse/significant  other Available Help at Discharge: Available 24 hours/day;Family Type of Home: House Home Access: Stairs to enter [employer] of Steps: 6 Entrance Stairs-Rails: Right;Left Home Layout: Multi-level Alternate Level Stairs-Number of Steps: 14 Alternate Level Stairs-Rails: Left;Right (rail switch sides) Bathroom Shower/Tub: Occupational psychologist: Standard     Home Equipment: Shower seat;Cane - single point;Walker - 2 wheels          Prior Functioning/Environment Level of Independence: Needs assistance  Gait / Transfers Assistance Needed: Intermittent use of SPC vs RW. RW mostly for night trips to bathroom. ADL's / Homemaking Assistance Needed: Wife assist with housekeeping and homemaking tasks. Uses AE for LB dressing.   Comments: uses RW when feels weak        OT Problem List: Pain;Decreased strength;Impaired balance (sitting and/or standing);Decreased coordination;Decreased safety awareness;Decreased knowledge of precautions;Impaired sensation      OT Treatment/Interventions: Self-care/ADL training;Therapeutic exercise;Neuromuscular education;Energy conservation;DME and/or AE instruction;Therapeutic activities;Patient/family education;Balance training    OT Goals(Current goals can be found in the care plan section) Acute Rehab OT Goals Patient Stated Goal: To return home. OT Goal Formulation: With patient Time For Goal Achievement: 12/09/20 Potential to Achieve Goals: Good ADL Goals Pt Will Perform Grooming: with modified independence;standing Pt Will Perform Upper Body Dressing: with modified independence;sitting Pt Will Perform Lower Body Dressing: with modified independence;sit to/from stand;with adaptive equipment Additional ADL Goal #1: Patient will recall and demo 3/3 back precautions with good safety awareness, AE PRN and LRAD.  OT Frequency: Min 2X/week   Barriers to D/C:            Co-evaluation  AM-PAC OT "6 Clicks"  Daily Activity     Outcome Measure Help from another person eating meals?: None Help from another person taking care of personal grooming?: A Little Help from another person toileting, which includes using toliet, bedpan, or urinal?: A Little Help from another person bathing (including washing, rinsing, drying)?: A Little Help from another person to put on and taking off regular upper body clothing?: A Little Help from another person to put on and taking off regular lower body clothing?: A Little 6 Click Score: 19   End of Session Equipment Utilized During Treatment: Gait belt;Rolling walker Nurse Communication: Mobility status  Activity Tolerance: Patient tolerated treatment well Patient left: in chair;with call bell/phone within reach;with chair alarm set  OT Visit Diagnosis: Unsteadiness on feet (R26.81);Other abnormalities of gait and mobility (R26.89);Muscle weakness (generalized) (M62.81);History of falling (Z91.81)                Time: 9688-6484 OT Time Calculation (min): 25 min Charges:  OT General Charges $OT Visit: 1 Visit OT Evaluation $OT Eval Moderate Complexity: 1 Mod OT Treatments $Self Care/Home Management : 8-22 mins  Ayvin Lipinski H. OTR/L Supplemental OT, Department of rehab services [phone]  Samyak Sackmann R H. 11/25/2020, 1:25 PM

## 2020-11-25 NOTE — Anesthesia Postprocedure Evaluation (Signed)
Anesthesia Post Note  Patient: [name] Pratt  Procedure(s) Performed: Posterior Cervical Laminectomy Cervical three-four, Cervical four-five with lateral mass fusion (N/A Neck)     Patient location during evaluation: PACU Anesthesia Type: General Level of consciousness: awake and alert Pain management: pain level controlled Vital Signs Assessment: post-procedure vital signs reviewed and stable Respiratory status: spontaneous breathing, nonlabored ventilation, respiratory function stable and patient connected to nasal cannula oxygen Cardiovascular status: blood pressure returned to baseline and stable Postop Assessment: no apparent nausea or vomiting Anesthetic complications: no   No complications documented.  Last Vitals:  Vitals:   11/25/20 1122 11/25/20 1619  BP: (!) 152/65 (!) 152/75  Pulse: 65 71  Resp: 18 18  Temp: 36.7 C 36.7 C  SpO2: 94% 97%    Last Pain:  Vitals:   11/25/20 1710  TempSrc:   PainSc: [place]

## 2020-11-25 NOTE — Progress Notes (Signed)
Subjective: Patient reports Patient doing well significantly improved numbness in his hands and walking in addition pain back and leg also significantly improved with Foley catheter placement.  Objective: Vital signs in last 24 hours: Temp:  [97 F (36.1 C)-97.8 F (36.6 C)] 97.7 F (36.5 C) (03/17 0348) Pulse Rate:  [57-84] 75 (03/17 0348) Resp:  [9-20] 18 (03/17 0348) BP: (131-170)/(65-88) 153/82 (03/17 0348) SpO2:  [92 %-100 %] 95 % (03/17 0348) Weight:  [69.9 kg] 69.9 kg (03/16 1152)  Intake/Output from previous day: 03/16 0701 - 03/17 0700 In: 1942.5 [I.V.:1500; IV Piggyback:442.5] Out: 1625 [Urine:1550; Blood:75] Intake/Output this shift: No intake/output data recorded.  4+ out of 5 grip strength neurologically at his baseline symptomatically improved  Lab Results: Recent Labs    11/22/20 1332  WBC 7.1  HGB 13.0  HCT 40.5  PLT 306   BMET Recent Labs    11/22/20 1332  NA 143  K 4.1  CL 106  CO2 30  GLUCOSE 106*  BUN 16  CREATININE 1.26*  CALCIUM 9.3    Studies/Results: DG Cervical Spine 2-3 Views  Result Date: 11/24/2020 CLINICAL DATA:  Posterior cervical laminectomy C3-C4, C4-C5 with lateral mass fusion. EXAM: DG C-ARM 1-60 MIN; CERVICAL SPINE - 2-3 VIEW FLUOROSCOPY TIME:  Fluoroscopy Time:  10 seconds. Radiation Exposure Index (if provided by the fluoroscopic device): 1.04 mGy. Number of Acquired Spot Images: 2 COMPARISON:  October 21, 2020. FINDINGS: Two C-arm fluoroscopic images were obtained intraoperatively and submitted for post operative interpretation. These images demonstrate lateral mass screws on the right at C3, C4, and C5 and on the left at C3 and C5 with intervening rods. Suspected laminectomies, suboptimally evaluated by fluoroscopy. Prior ACDF with intervening spacer at C3-C4. Please see the performing provider's procedural report for further detail. IMPRESSION: Intraoperative fluoroscopy, as detailed above. Electronically Signed   By:  Margaretha Sheffield MD   On: 11/24/2020 16:46   DG C-Arm 1-60 Min  Result Date: 11/24/2020 CLINICAL DATA:  Posterior cervical laminectomy C3-C4, C4-C5 with lateral mass fusion. EXAM: DG C-ARM 1-60 MIN; CERVICAL SPINE - 2-3 VIEW FLUOROSCOPY TIME:  Fluoroscopy Time:  10 seconds. Radiation Exposure Index (if provided by the fluoroscopic device): 1.04 mGy. Number of Acquired Spot Images: 2 COMPARISON:  October 21, 2020. FINDINGS: Two C-arm fluoroscopic images were obtained intraoperatively and submitted for post operative interpretation. These images demonstrate lateral mass screws on the right at C3, C4, and C5 and on the left at C3 and C5 with intervening rods. Suspected laminectomies, suboptimally evaluated by fluoroscopy. Prior ACDF with intervening spacer at C3-C4. Please see the performing provider's procedural report for further detail. IMPRESSION: Intraoperative fluoroscopy, as detailed above. Electronically Signed   By: Margaretha Sheffield MD   On: 11/24/2020 16:46    Assessment/Plan: Postop day 1 posterior cervical decompression fusion doing well incision clean dry and intact neurologic exam stable mobilize today with physical Occupational Therapy determine home health needs leave catheter in place patient will probably discharged with a catheter for follow-up with urology.  LOS: 1 day     Elaina Hoops 11/25/2020, 7:28 AM

## 2020-11-25 NOTE — Progress Notes (Signed)
Dr. Zada Finders paged d/t pt having bladder scan 821 cc and pt refusing to amb this shift d/t pain post pain management.

## 2020-11-25 NOTE — Progress Notes (Signed)
Pt. has not had much output this shift; did state that he has not had much to drink today. Bladder scan performed at 1315 with results of 151 mL. Pt. encouraged to drink more fluids. Will continue to monitor.

## 2020-11-25 NOTE — Plan of Care (Signed)
  Problem: Safety: Goal: Ability to remain free from injury will improve Outcome: Progressing   Problem: Education: Goal: Ability to verbalize activity precautions or restrictions will improve Outcome: Progressing Goal: Knowledge of the prescribed therapeutic regimen will improve Outcome: Progressing Goal: Understanding of discharge needs will improve Outcome: Progressing   Problem: Activity: Goal: Ability to avoid complications of mobility impairment will improve Outcome: Progressing Goal: Ability to tolerate increased activity will improve Outcome: Progressing Goal: Will remain free from falls Outcome: Progressing   Problem: Bowel/Gastric: Goal: Gastrointestinal status for postoperative course will improve Outcome: Progressing   Problem: Clinical Measurements: Goal: Ability to maintain clinical measurements within normal limits will improve Outcome: Progressing Goal: Postoperative complications will be avoided or minimized Outcome: Progressing Goal: Diagnostic test results will improve Outcome: Progressing   Problem: Pain Management: Goal: Pain level will decrease Outcome: Progressing   Problem: Skin Integrity: Goal: Will show signs of wound healing Outcome: Progressing   Problem: Health Behavior/Discharge Planning: Goal: Identification of resources available to assist in meeting health care needs will improve Outcome: Progressing   Problem: Bladder/Genitourinary: Goal: Urinary functional status for postoperative course will improve Outcome: Progressing

## 2020-11-26 DIAGNOSIS — R339 Retention of urine, unspecified: Secondary | ICD-10-CM

## 2020-11-26 HISTORY — DX: Retention of urine, unspecified: R33.9

## 2020-11-26 LAB — GLUCOSE, CAPILLARY
Glucose-Capillary: 112 mg/dL — ABNORMAL HIGH (ref 70–99)
Glucose-Capillary: 103 mg/dL — ABNORMAL HIGH (ref 70–99)
Glucose-Capillary: 135 mg/dL — ABNORMAL HIGH (ref 70–99)
Glucose-Capillary: 112 mg/dL — ABNORMAL HIGH (ref 70–99)

## 2020-11-26 MED ORDER — CHLORHEXIDINE GLUCONATE CLOTH 2 % EX PADS
6.0000 | MEDICATED_PAD | Freq: Every day | CUTANEOUS | Status: DC
  Administered 2020-11-26 – 2020-12-02 (×7): 6 via TOPICAL

## 2020-11-26 NOTE — Progress Notes (Signed)
Physical Therapy Treatment Patient Details Name: [name] Pratt MRN: [id number] DOB: Jun 06, 1941 Today's Date: 11/26/2020    History of Present Illness 80 y.o. 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 PMHx significant for ACDF C3-4 on 07/09/20, skin cancer, COPD, HLD, HTN, OSA, bilateral TKA and THA.    PT Comments    Pt agreeable to get OOB, when attempting pt with increased pain and unable to proceed requiring assist to return sidelying in bed. Assisted with positioning wth pillow to improve alignment/posture in bed to assist with pain. Pt states the pain decreased with the positioning. Pt continues to be limited by pain, endurance and strength and will benefit from skilled PT to address to maximize independence with functional mobiltiy prior to discharge.     Follow Up Recommendations  Home health PT;Supervision - Intermittent     Equipment Recommendations  None recommended by PT    Recommendations for Other Services       Precautions / Restrictions Precautions Precautions: Back Precaution Booklet Issued: Yes (comment) Restrictions Weight Bearing Restrictions: No    Mobility  Bed Mobility Overal bed mobility: Needs Assistance Bed Mobility: Sit to Sidelying;Sidelying to Sit   Sidelying to sit: Min assist;HOB elevated     Sit to sidelying: Min assist;HOB elevated General bed mobility comments: pt requiring assist during session due to increased pain when attempted bed mobility    Transfers                    Ambulation/Gait                 Stairs             Wheelchair Mobility    Modified Rankin (Stroke Patients Only)       Balance                                            Cognition                                              Exercises      General Comments General comments (skin  integrity, edema, etc.): pt became dizzy during sitting BP 163/70. Due to pain pt returned sidelying in bed. Increased time spent discussing wife's level of assist upon discharge. Discussed wife might need to assist with bed mobility when pain is elevated, pt concerned regarding how much wife can help      Pertinent Vitals/Pain Pain Score: 10-Worst pain ever Pain Location: R aspect of cervical neck    Home Living                      Prior Function            PT Goals (current goals can now be found in the care plan section) Acute Rehab PT Goals Patient Stated Goal: To return home. PT Goal Formulation: With patient Time For Goal Achievement: 12/09/20 Potential to Achieve Goals: Good Progress towards PT goals: Not progressing toward goals - comment (pt limited by pain)    Frequency    Min 5X/week      PT Plan Current plan remains appropriate    Co-evaluation  AM-PAC PT "6 Clicks" Mobility   Outcome Measure  Help needed turning from your back to your side while in a flat bed without using bedrails?: None Help needed moving from lying on your back to sitting on the side of a flat bed without using bedrails?: A Little Help needed moving to and from a bed to a chair (including a wheelchair)?: A Little Help needed standing up from a chair using your arms (e.g., wheelchair or bedside chair)?: A Lot Help needed to walk in hospital room?: A Little Help needed climbing 3-5 steps with a railing? : A Little 6 Click Score: 18    End of Session   Activity Tolerance: Patient limited by pain Patient left: in bed;with call bell/phone within reach;with bed alarm set Nurse Communication: Mobility status PT Visit Diagnosis: Unsteadiness on feet (R26.81);Other abnormalities of gait and mobility (R26.89);Muscle weakness (generalized) (M62.81)     Time: 0352-4818 PT Time Calculation (min) (ACUTE ONLY): 9 min  Charges:  $Therapeutic Activity: 8-22 mins                      Lyanne Co, DPT Acute Rehabilitation Services [phone]   [place] 11/26/2020, 10:55 AM

## 2020-11-26 NOTE — Progress Notes (Signed)
Subjective: Patient reports Patient doing okay condition of increased neck pain last night it is better with pain medication now.  Patient however was not able to progress with physical therapy this morning.  Objective: Vital signs in last 24 hours: Temp:  [98 F (36.7 C)-99.1 F (37.3 C)] 98 F (36.7 C) (03/18 1155) Pulse Rate:  [65-76] 65 (03/18 1155) Resp:  [17-18] 17 (03/18 1155) BP: (152-173)/(69-78) 163/69 (03/18 1155) SpO2:  [93 %-97 %] 93 % (03/18 0600)  Intake/Output from previous day: 03/17 0701 - 03/18 0700 In: 250 [P.O.:240; I.V.:10] Out: 525 [Urine:525] Intake/Output this shift: No intake/output data recorded.  Awake and alert incision clean dry and intact strength is at its baseline  Lab Results: No results for input(s): WBC, HGB, HCT, PLT in the last 72 hours. BMET No results for input(s): NA, K, CL, CO2, GLUCOSE, BUN, CREATININE, CALCIUM in the last 72 hours.  Studies/Results: DG Cervical Spine 2-3 Views  Result Date: 11/24/2020 CLINICAL DATA:  Posterior cervical laminectomy C3-C4, C4-C5 with lateral mass fusion. EXAM: DG C-ARM 1-60 MIN; CERVICAL SPINE - 2-3 VIEW FLUOROSCOPY TIME:  Fluoroscopy Time:  10 seconds. Radiation Exposure Index (if provided by the fluoroscopic device): 1.04 mGy. Number of Acquired Spot Images: 2 COMPARISON:  October 21, 2020. FINDINGS: Two C-arm fluoroscopic images were obtained intraoperatively and submitted for post operative interpretation. These images demonstrate lateral mass screws on the right at C3, C4, and C5 and on the left at C3 and C5 with intervening rods. Suspected laminectomies, suboptimally evaluated by fluoroscopy. Prior ACDF with intervening spacer at C3-C4. Please see the performing provider's procedural report for further detail. IMPRESSION: Intraoperative fluoroscopy, as detailed above. Electronically Signed   By: Margaretha Sheffield MD   On: 11/24/2020 16:46   DG C-Arm 1-60 Min  Result Date: 11/24/2020 CLINICAL DATA:   Posterior cervical laminectomy C3-C4, C4-C5 with lateral mass fusion. EXAM: DG C-ARM 1-60 MIN; CERVICAL SPINE - 2-3 VIEW FLUOROSCOPY TIME:  Fluoroscopy Time:  10 seconds. Radiation Exposure Index (if provided by the fluoroscopic device): 1.04 mGy. Number of Acquired Spot Images: 2 COMPARISON:  October 21, 2020. FINDINGS: Two C-arm fluoroscopic images were obtained intraoperatively and submitted for post operative interpretation. These images demonstrate lateral mass screws on the right at C3, C4, and C5 and on the left at C3 and C5 with intervening rods. Suspected laminectomies, suboptimally evaluated by fluoroscopy. Prior ACDF with intervening spacer at C3-C4. Please see the performing provider's procedural report for further detail. IMPRESSION: Intraoperative fluoroscopy, as detailed above. Electronically Signed   By: Margaretha Sheffield MD   On: 11/24/2020 16:46    Assessment/Plan: Postop day 2 posterior cervical decompression fusion doing okay still has some pain management issues did not get out of bed this morning we need to make sure the patient is can be safe getting out of bed and ambulating before he goes home with his wife he told me physical therapy would be working with him this afternoon so we will await their determination otherwise he can go home this evening provided home health is arranged and therapy feels like he is at the point where he can take care of himself with his wife's help.  LOS: 2 days     Elaina Hoops 11/26/2020, 12:14 PM

## 2020-11-26 NOTE — Progress Notes (Signed)
Occupational Therapy Treatment Patient Details Name: [name] Pratt MRN: [id number] DOB: [birth year] Today's Date: 11/26/2020    History of present illness 80 y.o. 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 PMHx significant for ACDF C3-4 on 07/09/20, skin cancer, COPD, HLD, HTN, OSA, bilateral TKA and THA.   OT comments  Patient supine in bed upon entry and agreeable to OT session.  Pt remains limited by pain, reporting pain in posterior neck, upper back and posterior R upper arm up to 10/10 shooting pain with activity (lowest 3/10 in bed comfortably).  He required min assist for bed mobility and mod assist for sit to stand from elevated EOB.  He required increased encouragement and 2 trials to attempt to stand, cueing for hand placement and posture.  He fatigues easily and voices he feels "very weak".  Patient remains concerned about going home and his spouses ability to take care of him like this, and I agree. Based on performance today, recommend continued OT services acutely to ensure patient can safely discharge home.  If patient does not progress, he may need some rehab. Recommend OT/PT services tomorrow acutely.    Follow Up Recommendations  Home health OT;Supervision/Assistance - 24 hour    Equipment Recommendations  None recommended by OT    Recommendations for Other Services      Precautions / Restrictions Precautions Precautions: Back Precaution Comments: reviewed precautions with pt, good recall Required Braces or Orthoses:  (no brace needed) Restrictions Weight Bearing Restrictions: No       Mobility Bed Mobility Overal bed mobility: Needs Assistance Bed Mobility: Sit to Sidelying;Sidelying to Sit;Rolling Rolling: Min assist Sidelying to sit: Min assist;HOB elevated     Sit to sidelying: Min assist;HOB elevated General bed mobility comments: min assist for trunk  support to ascend and LB back to bed; increased time and cueing for technique    Transfers Overall transfer level: Needs assistance Equipment used: Rolling walker (2 wheeled) Transfers: Sit to/from Stand Sit to Stand: Mod assist;From elevated surface         General transfer comment: cueing for hand placement and posture, able to stand on 2nd attempt with mod assist to power up    Balance Overall balance assessment: Needs assistance Sitting-balance support: No upper extremity supported;Feet supported Sitting balance-Leahy Scale: Good     Standing balance support: Bilateral upper extremity supported;During functional activity Standing balance-Leahy Scale: Fair Standing balance comment: relies on BUE and external support, cueing for posture and poor tolerance                           ADL either performed or assessed with clinical judgement   ADL Overall ADL's : Needs assistance/impaired                         Toilet Transfer: Moderate assistance;RW Toilet Transfer Details (indicate cue type and reason): sit to stand from elevated EOB, side stepping towards HOB         Functional mobility during ADLs: Moderate assistance;Rolling walker General ADL Comments: patient limited by pain, weakness, and BUE deficits     Vision       Perception     Praxis      Cognition Arousal/Alertness: Awake/alert Behavior During Therapy: Anxious Overall Cognitive Status: Impaired/Different from baseline Area of Impairment: Memory  Memory: Decreased short-term memory         General Comments: anxious due to pain during session, some decreased STM and hyperfocused on pain        Exercises     Shoulder Instructions       General Comments patient remains concerned about dc home and spouses ability to help patient when he is in pain; patient is sharp and shooting ranging 3 when laying comfortably up to 10 with activity     Pertinent Vitals/ Pain       Pain Assessment: 0-10 Pain Score: 10-Worst pain ever Pain Location: neck, upper back, R posterior arm Pain Descriptors / Indicators: Aching;Discomfort;Operative site guarding;Shooting;Sharp Pain Intervention(s): Limited activity within patient's tolerance;Monitored during session;Repositioned;Premedicated before session  Home Living                                          Prior Functioning/Environment              Frequency  Min 2X/week        Progress Toward Goals  OT Goals(current goals can now be found in the care plan section)  Progress towards OT goals: Not progressing toward goals - comment (pain, limited session)  Acute Rehab OT Goals Patient Stated Goal: to get home, less pain OT Goal Formulation: With patient  Plan Discharge plan remains appropriate;Frequency remains appropriate    Co-evaluation                 AM-PAC OT "6 Clicks" Daily Activity     Outcome Measure   Help from another person eating meals?: None Help from another person taking care of personal grooming?: A Little Help from another person toileting, which includes using toliet, bedpan, or urinal?: A Little Help from another person bathing (including washing, rinsing, drying)?: A Little Help from another person to put on and taking off regular upper body clothing?: A Little Help from another person to put on and taking off regular lower body clothing?: A Little 6 Click Score: 19    End of Session Equipment Utilized During Treatment: Gait belt;Rolling walker  OT Visit Diagnosis: Unsteadiness on feet (R26.81);Other abnormalities of gait and mobility (R26.89);Muscle weakness (generalized) (M62.81);History of falling (Z91.81)   Activity Tolerance Patient limited by pain   Patient Left in bed;with call bell/phone within reach;with bed alarm set   Nurse Communication Mobility status;Precautions (recommendations)        Time:  9833-8250 OT Time Calculation (min): 21 min  Charges: OT General Charges $OT Visit: 1 Visit OT Treatments $Self Care/Home Management : 8-22 mins  Jolaine Artist, OT [place] Pager [phone] Office [phone]    Delight Stare 11/26/2020, 3:36 PM

## 2020-11-27 LAB — GLUCOSE, CAPILLARY
Glucose-Capillary: 120 mg/dL — ABNORMAL HIGH (ref 70–99)
Glucose-Capillary: 123 mg/dL — ABNORMAL HIGH (ref 70–99)
Glucose-Capillary: 117 mg/dL — ABNORMAL HIGH (ref 70–99)
Glucose-Capillary: 118 mg/dL — ABNORMAL HIGH (ref 70–99)

## 2020-11-27 MED ORDER — BISACODYL 5 MG PO TBEC
10.0000 mg | DELAYED_RELEASE_TABLET | Freq: Once | ORAL | Status: AC
  Administered 2020-11-27: 10 mg via ORAL
  Filled 2020-11-27: qty 2

## 2020-11-27 NOTE — Progress Notes (Signed)
Occupational Therapy Treatment Patient Details Name: [name] Pratt MRN: [id number] DOB: [birth year] Today's Date: 11/27/2020    History of present illness 80 y.o. 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 PMHx significant for ACDF C3-4 on 07/09/20, skin cancer, COPD, HLD, HTN, OSA, bilateral TKA and THA.   OT comments  Pt requires increased assistance to perform ADLs this date due to shoulder and neck pain and fatigue.  He requires max A for LB ADLs with use of adaptive equipment.  He initially required significant effort and time when attempting to feed self as he had difficulty moving his hand to his mouth, however, he did much better when therapist set him up and left the room.  Given his slow progress, and increased assist needs, anticipate he will require SNF level rehab.   Follow Up Recommendations  SNF;Supervision/Assistance - 24 hour    Equipment Recommendations  None recommended by OT    Recommendations for Other Services      Precautions / Restrictions Precautions Precautions: Cervical Precaution Booklet Issued: Yes (comment) Precaution Comments: Pt able to state precautions independently.  Requires  min verbal cues to adhere to them Required Braces or Orthoses:  (no brace) Restrictions Weight Bearing Restrictions: No       Mobility Bed Mobility Overal bed mobility: Needs Assistance Bed Mobility: Sidelying to Sit Rolling: Min guard Sidelying to sit: HOB elevated;Mod assist     Sit to sidelying: Min assist General bed mobility comments: increased time and effort    Transfers Overall transfer level: Needs assistance Equipment used: Rolling walker (2 wheeled) Transfers: Sit to/from Stand Sit to Stand: Mod assist         General transfer comment: did not attempt due to arrival of dinner    Balance Overall balance assessment: Needs assistance Sitting-balance  support: No upper extremity supported;Feet supported Sitting balance-Leahy Scale: Fair     Standing balance support: Bilateral upper extremity supported;During functional activity Standing balance-Leahy Scale: Poor Standing balance comment: relies on BUE and external support, cueing for posture and poor tolerance; posterior LOB x3 during longer gait trial                           ADL either performed or assessed with clinical judgement   ADL Overall ADL's : Needs assistance/impaired Eating/Feeding: Set up;Supervision/ safety;Sitting Eating/Feeding Details (indicate cue type and reason): Pt refused to transfer to recliner for self feeding stating he has difficulty maneuvering utensils and moving food to mouth due to seat height.  Pt initially demonstrated signficant difficulty moving hand to mouth as he was not flexing his shoulder.  However, when therapist not present, pt demonstrated improved fluidity of movement and ability to self feed                 Lower Body Dressing: Maximal assistance Lower Body Dressing Details (indicate cue type and reason): pt unable to perform figure 4.  He reports he has a LH shoe horn, but no other AE.  He was instructed in use of reacher and sock aid for LB ADLs, but requires min A to doff socks, and max A to don them.  He was too fatigued and reported increased pain therefore, unable to attempt donning pants             Functional mobility during ADLs: Moderate assistance;Rolling walker       Vision  Perception     Praxis      Cognition Arousal/Alertness: Awake/alert Behavior During Therapy: Anxious Overall Cognitive Status: No family/caregiver present to determine baseline cognitive functioning Area of Impairment: Memory                     Memory: Decreased short-term memory         General Comments: requires cues for problem solving and memory        Exercises Exercises: General Lower  Extremity General Exercises - Lower Extremity Ankle Circles/Pumps: AROM;Both;10 reps;Supine Short Arc Quad: AROM;Both;10 reps;Supine Heel Slides: AROM;Both;10 reps;Supine Hip ABduction/ADduction: AROM;Both;10 reps;Supine   Shoulder Instructions       General Comments no dizziness reported, VSS per chart review and not otherwise assessed    Pertinent Vitals/ Pain       Pain Assessment: Faces Pain Score: 5  Faces Pain Scale: Hurts even more Pain Location: bil. shoulders and neck Pain Descriptors / Indicators: Aching;Discomfort;Operative site guarding;Shooting;Sharp Pain Intervention(s): Monitored during session;Premedicated before session;Repositioned  Home Living                                          Prior Functioning/Environment              Frequency  Min 2X/week        Progress Toward Goals  OT Goals(current goals can now be found in the care plan section)  Progress towards OT goals: Not progressing toward goals - comment  Acute Rehab OT Goals Patient Stated Goal: to get home, less pain  Plan Discharge plan needs to be updated    Co-evaluation                 AM-PAC OT "6 Clicks" Daily Activity     Outcome Measure   Help from another person eating meals?: A Little Help from another person taking care of personal grooming?: A Little Help from another person toileting, which includes using toliet, bedpan, or urinal?: A Lot Help from another person bathing (including washing, rinsing, drying)?: A Lot Help from another person to put on and taking off regular upper body clothing?: A Lot Help from another person to put on and taking off regular lower body clothing?: A Lot 6 Click Score: 14    End of Session    OT Visit Diagnosis: Unsteadiness on feet (R26.81);Pain Pain - part of body: Shoulder   Activity Tolerance Patient limited by pain   Patient Left in bed;with call bell/phone within reach;with bed alarm set   Nurse  Communication Mobility status        Time: 1610-9604 OT Time Calculation (min): 31 min  Charges: OT General Charges $OT Visit: 1 Visit OT Treatments $Self Care/Home Management : 23-37 mins  Nilsa Nutting OTR/L Acute Rehabilitation Services Pager [phone] Office [phone]    Lucille Passy M 11/27/2020, 5:41 PM

## 2020-11-27 NOTE — Progress Notes (Addendum)
Physical Therapy Treatment Patient Details Name: [name] Pratt MRN: [id number] DOB: [birth year] Today's Date: 11/27/2020    History of Present Illness 80 y.o. 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 PMHx significant for ACDF C3-4 on 07/09/20, skin cancer, COPD, HLD, HTN, OSA, bilateral TKA and THA.    PT Comments    Pt received in supine, agreeable to therapy session with heavy encouragement and with good participation and tolerance for mobility. Pt anxious to mobilize due to fear of pain but was premedicated and performed supine BLE exercises then agreeable to log roll to EOB and transfers/gait. Pt performed short gait task to [facility] with min/modA with multiple posterior LOB and continues to need modA for other functional mobility tasks. Pt continues to benefit from PT services to progress toward functional mobility goals. Pt not yet safe to DC home as he reports his wife unable to physically assist to lift him and he still needs modA for functional mobility tasks, may need to consider post-acute PT if progress still limited next session.  Follow Up Recommendations  Home health PT;Supervision - Intermittent (will continue to assess pending progress, not yet able to mobilize at household distances)     Equipment Recommendations  None recommended by PT (TBD)    Recommendations for Other Services       Precautions / Restrictions Precautions Precautions: Back Precaution Booklet Issued: Yes (comment) Precaution Comments: reviewed precautions with pt Required Braces or Orthoses:  (no brace needed) Restrictions Weight Bearing Restrictions: No    Mobility  Bed Mobility Overal bed mobility: Needs Assistance Bed Mobility: Sidelying to Sit;Rolling Rolling: Min assist Sidelying to sit: HOB elevated;Mod assist       General bed mobility comments: tactile cues and increased time for log  rolling, modA for trunk rise    Transfers Overall transfer level: Needs assistance Equipment used: Rolling walker (2 wheeled) Transfers: Sit to/from Stand Sit to Stand: Mod assist         General transfer comment: cueing for hand placement and posture, increased time  Ambulation/Gait Ambulation/Gait assistance: Min assist Gait Distance (Feet): 6 Feet Assistive device: Rolling walker (2 wheeled) Gait Pattern/deviations: Step-through pattern;Decreased stride length;Trunk flexed Gait velocity: decreased   General Gait Details: flexed at hips, needs cues for posture   Stairs             Wheelchair Mobility    Modified Rankin (Stroke Patients Only)       Balance Overall balance assessment: Needs assistance Sitting-balance support: No upper extremity supported;Feet supported Sitting balance-Leahy Scale: Good     Standing balance support: Bilateral upper extremity supported;During functional activity Standing balance-Leahy Scale: Fair Standing balance comment: relies on BUE and external support, cueing for posture and poor tolerance                            Cognition Arousal/Alertness: Awake/alert Behavior During Therapy: Anxious Overall Cognitive Status: Impaired/Different from baseline Area of Impairment: Memory                     Memory: Decreased short-term memory         General Comments: anxious due to pain during session, some decreased STM and hyperfocused on pain; agreeable to transfers after supine therex and encouragement      Exercises General Exercises - Lower Extremity Ankle Circles/Pumps: AROM;Both;10 reps;Supine Short Arc Quad: AROM;Both;10 reps;Supine Heel Slides: AROM;Both;10 reps;Supine  Hip ABduction/ADduction: AROM;Both;10 reps;Supine    General Comments General comments (skin integrity, edema, etc.): inconsistent with pain report, more fear of pain than severe pain during mobility      Pertinent  Vitals/Pain Pain Assessment: 0-10 Pain Score: 5  Pain Location: groin, neck (5/10 seated) Pain Descriptors / Indicators: Aching;Discomfort;Operative site guarding;Shooting;Sharp Pain Intervention(s): Monitored during session;Premedicated before session;Repositioned (pain meds ~20 mins prior)    Home Living                      Prior Function            PT Goals (current goals can now be found in the care plan section) Acute Rehab PT Goals Patient Stated Goal: to get home, less pain PT Goal Formulation: With patient Time For Goal Achievement: 12/09/20 Potential to Achieve Goals: Good Progress towards PT goals: Progressing toward goals (slow progress)    Frequency    Min 5X/week      PT Plan Current plan remains appropriate    Co-evaluation              AM-PAC PT "6 Clicks" Mobility   Outcome Measure  Help needed turning from your back to your side while in a flat bed without using bedrails?: A Little Help needed moving from lying on your back to sitting on the side of a flat bed without using bedrails?: A Lot Help needed moving to and from a bed to a chair (including a wheelchair)?: A Little Help needed standing up from a chair using your arms (e.g., wheelchair or bedside chair)?: A Lot Help needed to walk in hospital room?: A Little Help needed climbing 3-5 steps with a railing? : Total 6 Click Score: 14    End of Session Equipment Utilized During Treatment: Gait belt Activity Tolerance: Patient limited by pain;Patient tolerated treatment well Patient left: in chair;with call bell/phone within reach;Other (comment) (on BSC, NT aware) Nurse Communication: Mobility status PT Visit Diagnosis: Unsteadiness on feet (R26.81);Other abnormalities of gait and mobility (R26.89);Muscle weakness (generalized) (M62.81)     Time: 1540-1601 PT Time Calculation (min) (ACUTE ONLY): 21 min  Charges:  $Therapeutic Exercise: 8-22 mins                     Carly P.,  PTA Acute Rehabilitation Services Pager: [phone] Office: [place] 11/27/2020, 4:26 PM

## 2020-11-27 NOTE — Progress Notes (Signed)
Pt continue to c/o of pain to neck and RUE, numbness and tingling. Pt got out of bed with RN and ambulated to BR with assistance using walker. Pt back in bed with call light within reach and bed alarm on. Will continue to closely monitor. Francis Gaines Sutter Ahlgren RN.

## 2020-11-27 NOTE — Progress Notes (Signed)
   Providing Compassionate, Quality Care - Together  NEUROSURGERY PROGRESS NOTE   S: No issues overnight. States his right arm pain is improving, still has post neck pain  O: EXAM:  BP (!) 173/76 (BP Location: Left Arm)   Pulse 64   Temp 98.4 F (36.9 C) (Oral)   Resp 18   Ht 5\' 6"  (1.676 m)   Wt 69.9 kg   SpO2 94%   BMI 24.86 kg/m   Awake, alert, oriented  Speech fluent, appropriate  CNs grossly intact  MAE, 4+/5 BUE RLE prox 3/5, distal 4+/5 LLE 4+/5 Incision c/d/i  ASSESSMENT:  80 y.o. male with  Cervical myelopathy  Urinary retention  PLAN: - pt eval, patient likely either needs 24/7 home health care or possibly SNF placement.  Awaiting reevaluation by physical therapy today.  Is not quite ready for discharge -Urinary retention, consult urology for possible follow-up as an outpatient, Foley replaced overnight -Pain control    Thank you for allowing me to participate in this patient's care.  Please do not hesitate to call with questions or concerns.   Elwin Sleight, [facility] Cell: [phone]

## 2020-11-27 NOTE — Progress Notes (Signed)
Occupational Therapy Progress note  Pt assisted with return to supine from EOB.  He required min A to to lift LEs onto bed and mod verbal cues to scoot hips to the right - he requires a significant amount of time to complete tasks.  Recommend SNF level rehab.     11/27/20 1757  OT Visit Information  Last OT Received On 11/27/20  Assistance Needed +1  History of Present Illness 80 y.o. 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 PMHx significant for ACDF C3-4 on 07/09/20, skin cancer, COPD, HLD, HTN, OSA, bilateral TKA and THA.  Precautions  Precautions Cervical  Precaution Booklet Issued Yes (comment)  Precaution Comments able to state precautions  Pain Assessment  Pain Assessment Faces  Faces Pain Scale 6  Pain Location bil. shoulders and neck  Pain Descriptors / Indicators Aching;Discomfort;Operative site guarding;Shooting;Sharp  Pain Intervention(s) Monitored during session  Cognition  Arousal/Alertness Awake/alert  Behavior During Therapy Anxious  Overall Cognitive Status No family/caregiver present to determine baseline cognitive functioning  General Comments requires cues for problem solving and memory  Upper Extremity Assessment  Upper Extremity Assessment Generalized weakness  RUE Deficits / Details pt demonstrates difficulty manipulating objects, but does better when therapist no in the room observing him  RUE Sensation decreased light touch  RUE Coordination decreased fine motor;decreased gross motor  LUE Deficits / Details pt demonstrates difficulty manipulating objects, but does better when therapist no in the room observing him  LUE Sensation decreased light touch  LUE Coordination decreased fine motor;decreased gross motor  Lower Extremity Assessment  Lower Extremity Assessment Generalized weakness  Bed Mobility  Overal bed mobility Needs Assistance  Bed Mobility Sit to  Sidelying  Rolling Min guard  Sit to sidelying Min assist  General bed mobility comments Pt required step by step cues and increased time to complete sidelying to sit and then to scoot hips to the Rt in the bed.  Min cues provided for cues  OT Assessment/Plan  OT Plan Discharge plan remains appropriate  OT Visit Diagnosis Unsteadiness on feet (R26.81);Pain  Pain - part of body Shoulder  OT Frequency (ACUTE ONLY) Min 2X/week  Follow Up Recommendations SNF;Supervision/Assistance - 24 hour  OT Equipment None recommended by OT  AM-PAC OT "6 Clicks" Daily Activity Outcome Measure (Version 2)  Help from another person eating meals? 3  Help from another person taking care of personal grooming? 3  Help from another person toileting, which includes using toliet, bedpan, or urinal? 2  Help from another person bathing (including washing, rinsing, drying)? 2  Help from another person to put on and taking off regular upper body clothing? 2  Help from another person to put on and taking off regular lower body clothing? 2  6 Click Score 14  OT Goal Progression  Progress towards OT goals Not progressing toward goals - comment  OT Time Calculation  OT Start Time (ACUTE ONLY) 1744  OT Stop Time (ACUTE ONLY) 1753  OT Time Calculation (min) 9 min  OT General Charges  $OT Visit 1 Visit  OT Treatments  $Therapeutic Activity 8-22 mins  Nilsa Nutting., OTR/L Acute Rehabilitation Services Pager [phone] Office [phone]

## 2020-11-27 NOTE — Progress Notes (Signed)
Physical Therapy Treatment Patient Details Name: [name] Pratt MRN: [id number] DOB: 1941/03/07 Today's Date: 11/27/2020    History of Present Illness 80 y.o. 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 PMHx significant for ACDF C3-4 on 07/09/20, skin cancer, COPD, HLD, HTN, OSA, bilateral TKA and THA.    PT Comments    PTA called back into room to assist pt with getting up from [facility], pt agreeable to gait trial/further mobility. Pt needing modA for transfers from [facility] to RW and gait progression up to 92ft using RW with multiple posterior LOB. Pt needing minA for reverse log roll to flat bed and requesting to remain sidelying, pillows placed for comfort. Pt continues to benefit from PT services to progress toward functional mobility goals. Pt with limited progress toward goals and still needing modA, pt will benefit from low intensity post-acute rehab in skilled facility to progress safe functional mobility and independence. Discharge recommendations updated per discussion with supervising PT A.P. and patient.  Follow Up Recommendations  SNF;Supervision/Assistance - 24 hour     Equipment Recommendations  None recommended by PT (TBD)    Recommendations for Other Services       Precautions / Restrictions Precautions Precautions: Back Precaution Booklet Issued: Yes (comment) Precaution Comments: reviewed precautions with pt Required Braces or Orthoses:  (no brace needed) Restrictions Weight Bearing Restrictions: No    Mobility  Bed Mobility Overal bed mobility: Needs Assistance Bed Mobility: Sit to Sidelying Rolling: Min assist Sidelying to sit: HOB elevated;Mod assist     Sit to sidelying: Min assist General bed mobility comments: tactile cues and increased time for log rolling, minA for BLE elevation; once sidelying in bed, pt wanting to remain on side, pillow placed bwn legs for  comfort and behind backq    Transfers Overall transfer level: Needs assistance Equipment used: Rolling walker (2 wheeled) Transfers: Sit to/from Stand Sit to Stand: Mod assist         General transfer comment: cueing for hand placement and posture, increased time, has difficulty with anterior lean and needs modA to rise due to posterior lean  Ambulation/Gait Ambulation/Gait assistance: Min assist;Mod assist Gait Distance (Feet): 25 Feet Assistive device: Rolling walker (2 wheeled) Gait Pattern/deviations: Step-through pattern;Decreased stride length;Trunk flexed Gait velocity: decreased Gait velocity interpretation: <1.8 ft/sec, indicate of risk for recurrent falls General Gait Details: flexed at hips, needs cues for posture; able to increase step length with cues but multiple minor posterior LOB needing modA to correct      Balance Overall balance assessment: Needs assistance Sitting-balance support: No upper extremity supported;Feet supported Sitting balance-Leahy Scale: Good     Standing balance support: Bilateral upper extremity supported;During functional activity Standing balance-Leahy Scale: Poor Standing balance comment: relies on BUE and external support, cueing for posture and poor tolerance; posterior LOB x3 during longer gait trial         Cognition Arousal/Alertness: Awake/alert Behavior During Therapy: Anxious Overall Cognitive Status: Impaired/Different from baseline Area of Impairment: Memory      Memory: Decreased short-term memory         General Comments: anxious due to pain during session, some decreased STM and hyperfocused on pain; agreeable to progress gait but needs heavy encouragement      Exercises General Exercises - Lower Extremity Ankle Circles/Pumps: AROM;Both;10 reps;Supine Short Arc Quad: AROM;Both;10 reps;Supine Heel Slides: AROM;Both;10 reps;Supine Hip ABduction/ADduction: AROM;Both;10 reps;Supine    General Comments  General comments (skin integrity, edema, etc.):  no dizziness reported, VSS per chart review and not otherwise assessed      Pertinent Vitals/Pain Pain Assessment: 0-10 Pain Score: 5  Pain Location: groin, neck Pain Descriptors / Indicators: Aching;Discomfort;Operative site guarding;Shooting;Sharp Pain Intervention(s): Monitored during session;Premedicated before session;Repositioned     PT Goals (current goals can now be found in the care plan section) Acute Rehab PT Goals Patient Stated Goal: to get home, less pain PT Goal Formulation: With patient Time For Goal Achievement: 12/09/20 Potential to Achieve Goals: Good Progress towards PT goals: Progressing toward goals (slow progress)    Frequency    Min 5X/week      PT Plan Current plan remains appropriate       AM-PAC PT "6 Clicks" Mobility   Outcome Measure  Help needed turning from your back to your side while in a flat bed without using bedrails?: A Little Help needed moving from lying on your back to sitting on the side of a flat bed without using bedrails?: A Lot Help needed moving to and from a bed to a chair (including a wheelchair)?: A Little Help needed standing up from a chair using your arms (e.g., wheelchair or bedside chair)?: A Lot Help needed to walk in hospital room?: A Lot Help needed climbing 3-5 steps with a railing? : Total 6 Click Score: 13    End of Session Equipment Utilized During Treatment: Gait belt Activity Tolerance: Patient tolerated treatment well;Patient limited by fatigue Patient left: with call bell/phone within reach;in bed;with bed alarm set;with SCD's reapplied Nurse Communication: Mobility status PT Visit Diagnosis: Unsteadiness on feet (R26.81);Other abnormalities of gait and mobility (R26.89);Muscle weakness (generalized) (M62.81)     Time: 7195-9747 PT Time Calculation (min) (ACUTE ONLY): 12 min  Charges:  $Gait Training: 8-22 mins                    Madisson Kulaga P.,  PTA Acute Rehabilitation Services Pager: [phone] Office: [place] 11/27/2020, 4:49 PM

## 2020-11-28 LAB — GLUCOSE, CAPILLARY
Glucose-Capillary: 98 mg/dL (ref 70–99)
Glucose-Capillary: 117 mg/dL — ABNORMAL HIGH (ref 70–99)
Glucose-Capillary: 113 mg/dL — ABNORMAL HIGH (ref 70–99)
Glucose-Capillary: 117 mg/dL — ABNORMAL HIGH (ref 70–99)

## 2020-11-28 MED ORDER — POLYETHYLENE GLYCOL 3350 17 G PO PACK
17.0000 g | PACK | Freq: Two times a day (BID) | ORAL | Status: DC | PRN
  Administered 2020-11-28 – 2020-11-29 (×2): 17 g via ORAL
  Filled 2020-11-28 (×2): qty 1

## 2020-11-28 NOTE — Progress Notes (Signed)
Patient ID: [name] Pratt, male   DOB: 05-Jul-1941, 80 y.o.   MRN: [id number] BP (!) 103/56 (BP Location: Right Arm)   Pulse 70   Temp 98.2 F (36.8 C) (Oral)   Resp 18   Ht 5\' 6"  (1.676 m)   Wt 69.9 kg   SpO2 98%   BMI 24.86 kg/m  Will need placement. Not likely his wife can handle his care Complaining of shoulder pain, to be expected.  Moving all extremites

## 2020-11-29 LAB — GLUCOSE, CAPILLARY
Glucose-Capillary: 122 mg/dL — ABNORMAL HIGH (ref 70–99)
Glucose-Capillary: 320 mg/dL — ABNORMAL HIGH (ref 70–99)
Glucose-Capillary: 78 mg/dL (ref 70–99)
Glucose-Capillary: 163 mg/dL — ABNORMAL HIGH (ref 70–99)

## 2020-11-29 MED ORDER — BISACODYL 10 MG RE SUPP
10.0000 mg | Freq: Every day | RECTAL | Status: DC | PRN
  Administered 2020-11-29: 10 mg via RECTAL
  Filled 2020-11-29: qty 1

## 2020-11-29 NOTE — Progress Notes (Signed)
Physical Therapy Treatment Patient Details Name: [name] Pratt MRN: [id number] DOB: [birth year] Today's Date: 11/29/2020    History of Present Illness Pt is an 80 y/o male presenting s/p C3-C5 posterior cervical fusion on 11/24/20. PMHx significant for ACDF C3-4 on 07/09/20, skin cancer, COPD, HTN, bilateral TKA and THA.    PT Comments    Pt progressing slowly towards physical therapy goals. Pt requiring up to mod assist for balance support and safety with RW for support. Frequent cues throughout session for improved posture and maintenance of cervical precautions. Will  Continue to follow and progress as able per POC.   Follow Up Recommendations  SNF;Supervision/Assistance - 24 hour     Equipment Recommendations  None recommended by PT (TBD)    Recommendations for Other Services       Precautions / Restrictions Precautions Precautions: Cervical Precaution Booklet Issued: Yes (comment) Precaution Comments: able to state precautions but unable to maintain during functional mobility. Required Braces or Orthoses:  (no brace) Restrictions Weight Bearing Restrictions: No    Mobility  Bed Mobility Overal bed mobility: Needs Assistance Bed Mobility: Rolling;Sit to Sidelying Rolling: Min guard Sidelying to sit: HOB elevated;Mod assist     Sit to sidelying: Mod assist General bed mobility comments: hand-over-hand assist to reach for railings and cues for all aspects of bed mobility including repositioning in bed at the end. Assist for trunk elevation to full sitting position, scooting hips forward with bed pad, and lifting LE's back into bed at end of session.    Transfers Overall transfer level: Needs assistance Equipment used: Rolling walker (2 wheeled) Transfers: Sit to/from Stand Sit to Stand: Mod assist         General transfer comment: Bed height elevated and mod assist to power-up to full standing position. Pt required increased time to gain/maintain standing  balance.  Ambulation/Gait Ambulation/Gait assistance: Min assist;Mod assist Gait Distance (Feet): 150 Feet Assistive device: Rolling walker (2 wheeled) Gait Pattern/deviations: Step-through pattern;Decreased stride length;Trunk flexed Gait velocity: Decreased Gait velocity interpretation: <1.31 ft/sec, indicative of household ambulator General Gait Details: Frequent cues for improved posture, closer walker proximity, and forward gaze. Pt fatigues quickly and with poor self-monitoring for fatigue. Progressed from requiring min assist to heavy mod assist by end of gait training. Pt staggering and with multiple LOB while rushing to get back to the bed to sit down, despite cues for safety and the offer to pull up a secondary chair for a rest break.   Stairs             Wheelchair Mobility    Modified Rankin (Stroke Patients Only)       Balance Overall balance assessment: Needs assistance Sitting-balance support: No upper extremity supported;Feet supported Sitting balance-Leahy Scale: Fair     Standing balance support: Bilateral upper extremity supported;During functional activity Standing balance-Leahy Scale: Poor Standing balance comment: relies on BUE and external support, cueing for posture and poor tolerance; posterior LOB x3 during longer gait trial                            Cognition Arousal/Alertness: Awake/alert Behavior During Therapy: Anxious Overall Cognitive Status: Impaired/Different from baseline Area of Impairment: Memory;Awareness;Problem solving                     Memory: Decreased short-term memory     Awareness: Intellectual Problem Solving: Slow processing;Requires verbal cues General Comments: Pt able to verbalize that he  has been having hallucinations/delusions, however describing some situations he was imagining and then asking therapist if it really happened.      Exercises      General Comments        Pertinent  Vitals/Pain Pain Assessment: Faces Faces Pain Scale: Hurts whole lot Pain Location: bil. shoulders and neck by end of gait training Pain Descriptors / Indicators: Aching;Discomfort;Operative site guarding;Shooting;Sharp Pain Intervention(s): Limited activity within patient's tolerance;Monitored during session;Repositioned    Home Living                      Prior Function            PT Goals (current goals can now be found in the care plan section) Acute Rehab PT Goals Patient Stated Goal: "Finally get to rehab" PT Goal Formulation: With patient Time For Goal Achievement: 12/09/20 Potential to Achieve Goals: Good Progress towards PT goals: Progressing toward goals    Frequency    Min 5X/week      PT Plan Current plan remains appropriate    Co-evaluation              AM-PAC PT "6 Clicks" Mobility   Outcome Measure  Help needed turning from your back to your side while in a flat bed without using bedrails?: A Little Help needed moving from lying on your back to sitting on the side of a flat bed without using bedrails?: A Lot Help needed moving to and from a bed to a chair (including a wheelchair)?: A Little Help needed standing up from a chair using your arms (e.g., wheelchair or bedside chair)?: A Lot Help needed to walk in hospital room?: A Lot Help needed climbing 3-5 steps with a railing? : Total 6 Click Score: 13    End of Session Equipment Utilized During Treatment: Gait belt Activity Tolerance: Patient tolerated treatment well;Patient limited by fatigue Patient left: with call bell/phone within reach;in bed;with bed alarm set;with SCD's reapplied Nurse Communication: Mobility status PT Visit Diagnosis: Unsteadiness on feet (R26.81);Other abnormalities of gait and mobility (R26.89);Muscle weakness (generalized) (M62.81)     Time: 6825-7493 PT Time Calculation (min) (ACUTE ONLY): 27 min  Charges:  $Gait Training: 23-37 mins                      Rolinda Roan, PT, DPT Acute Rehabilitation Services Pager: [phone] Office: [phone]    Thelma Comp 11/29/2020, 3:16 PM

## 2020-11-29 NOTE — NC FL2 (Signed)
[place] MEDICAID FL2 LEVEL OF CARE SCREENING TOOL     IDENTIFICATION  Patient Name: [name] Pratt Birthdate: 12/13/1940 Sex: male Admission Date (Current Location): 11/24/2020  [facility] and [place] Number:  Herbalist and Address:  [facility], [place] [address], [place], [place] [address]      Provider Number: [id number]  Attending Physician Name and Address:  [name], MD  Relative Name and Phone Number:       Current Level of Care: Hospital Recommended Level of Care: [place] Prior Approval Number:    Date Approved/Denied:   PASRR Number: [id number]  Discharge Plan: SNF    Current Diagnoses: Patient Active Problem List   Diagnosis Date Noted  . Myelopathy ([place]) 11/24/2020  . Cystoid macular edema of both eyes 07/28/2020  . Status post cervical spinal fusion 07/09/2020  . History of total bilateral knee replacement 07/08/2020  . Aortic atherosclerosis ([place]) 03/05/2020  . Emphysema, unspecified ([place]) 03/05/2020  . SIRS (systemic inflammatory response syndrome) ([place]) 03/04/2020  . Heme positive stool 02/29/2020  . Leg cramps 11/13/2019  . Tibialis anterior tendon tear, nontraumatic 11/13/2019  . 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 CPAP (continuous positive airway pressure) dependence 03/25/2018  . 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 Acute idiopathic gout of hand 03/25/2018  . Erectile dysfunction 06/20/2017  . 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 Peripheral neuropathy 06/19/2016  . Hearing loss 02/02/2016  . 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 Allergic rhinitis 02/02/2016  . Atypical chest pain 11/24/2015  . Preventative health care 11/24/2015  . Onychomycosis 06/30/2015  . Degenerative disc disease, lumbar 06/30/2015  . Other allergic rhinitis 02/18/2015  . Deviated nasal septum 02/18/2015  . OSA on CPAP 02/18/2015  . RLS (restless  legs syndrome) 02/18/2015  . GERD (gastroesophageal reflux disease) 09/18/2014  . Hyperglycemia 03/03/2014  . Sleep apnea with use of continuous positive airway pressure (CPAP) 07/15/2013  . Carotid stenosis 02/24/2013  . Nonspecific abnormal electrocardiogram (ECG) (EKG) 01/06/2013  . DJD (degenerative joint disease) of hip 12/24/2012  . Lower GI bleed 12/12/2012  . Dyspnea 06/12/2011  . Hypothyroidism 05/13/2009  . HYPERCHOLESTEROLEMIA 05/13/2009  . Hyperlipidemia 05/13/2009  . 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  . Essential hypertension 05/13/2009  . INGUINAL HERNIA 05/13/2009  . HIATAL HERNIA 05/13/2009  . NEPHROLITHIASIS 05/13/2009  . ROSACEA 05/13/2009    Orientation RESPIRATION BLADDER Height & Weight     Self,Time,Situation,Place  Normal Indwelling catheter Weight: 154 lb (69.9 kg) Height:  5\' 6"  (167.6 cm)  BEHAVIORAL SYMPTOMS/MOOD NEUROLOGICAL BOWEL NUTRITION STATUS      Continent Diet (regular)  AMBULATORY STATUS COMMUNICATION OF NEEDS Skin   Limited Assist Verbally Surgical wounds (closed neck, honeycomb dressing)                       Personal Care Assistance Level of Assistance  Bathing,Feeding,Dressing Bathing Assistance: Limited assistance Feeding assistance: Limited assistance Dressing Assistance: Limited assistance     Functional Limitations Info             SPECIAL CARE FACTORS FREQUENCY  PT (By licensed PT),OT (By licensed OT)     PT Frequency: 5x/wk OT Frequency: 5x/wk            Contractures Contractures Info: Not present    Additional Factors Info  Code Status,Allergies Code Status Info: Full Allergies Info: Pravastatin, Sildenafil, Atenolol, Elastic Bandages & (Zinc), Metoclopramide Hcl  Current Medications (11/29/2020):  This is the current hospital active medication list Current Facility-Administered Medications  Medication Dose Route Frequency Provider Last Rate Last Admin  . 0.9 %  sodium chloride infusion  250 mL Intravenous  Continuous [name], MD      . acetaminophen (TYLENOL) tablet 650 mg  650 mg Oral Q4H PRN [name], MD   650 mg at 11/28/20 1708   Or  . acetaminophen (TYLENOL) suppository 650 mg  650 mg Rectal Q4H PRN [name], MD      . acetaminophen (TYLENOL) tablet 650 mg  650 mg Oral TID [name], MD   650 mg at 11/29/20 1017  . albuterol (VENTOLIN HFA) 108 (90 Base) MCG/ACT inhaler 2 puff  2 puff Inhalation Q6H PRN [name], MD      . allopurinol (ZYLOPRIM) tablet 100 mg  100 mg Oral BID [name], MD   100 mg at 11/29/20 1017  . alum & mag hydroxide-simeth (MAALOX/MYLANTA) 200-200-20 MG/5ML suspension 30 mL  30 mL Oral Q6H PRN [name], MD      . amLODipine (NORVASC) tablet 5 mg  5 mg Oral Daily [name], MD   5 mg at 11/29/20 1017  . aspirin EC tablet 81 mg  81 mg Oral q morning [name], MD   81 mg at 11/29/20 1022  . Chlorhexidine Gluconate Cloth 2 % PADS 6 each  6 each Topical Daily [name], MD   6 each at 11/29/20 1028  . cholecalciferol (VITAMIN D3) tablet 2,000 Units  2,000 Units Oral Daily [name], MD   2,000 Units at 11/29/20 1017  . colchicine tablet 0.6 mg  0.6 mg Oral Daily PRN [name], MD      . cyclobenzaprine (FLEXERIL) tablet 10 mg  10 mg Oral TID PRN [name], MD   10 mg at 11/29/20 0228  . cycloSPORINE (RESTASIS) 0.05 % ophthalmic emulsion 1 drop  1 drop Both Eyes BID [name], MD   1 drop at 11/29/20 1018  . docusate sodium (COLACE) capsule 100 mg  100 mg Oral Daily PRN [name], MD   100 mg at 11/28/20 2007  . gabapentin (NEURONTIN) capsule 300 mg  300 mg Oral QHS [name], MD   300 mg at 11/28/20 2006  . hydrALAZINE (APRESOLINE) tablet 50 mg  50 mg Oral BID [name], MD   50 mg at 11/29/20 1017  . HYDROmorphone (DILAUDID) injection 0.5 mg  0.5 mg Intravenous Q2H PRN [name], MD   0.5 mg at 11/26/20 2048  . insulin aspart (novoLOG) injection 0-15 Units  0-15 Units Subcutaneous TID WC [name], MD   11 Units at 11/29/20 1211  . levothyroxine  (SYNTHROID) tablet 150 mcg  150 mcg Oral Once per day on Mon Tue Wed Thu Fri Sat [name], MD   150 mcg at 11/29/20 0515  . levothyroxine (SYNTHROID) tablet 75 mcg  75 mcg Oral Q Donnella Bi, RPH   75 mcg at 11/28/20 3818  . losartan (COZAAR) tablet 100 mg  100 mg Oral QHS [name], MD   100 mg at 11/28/20 2006  . menthol-cetylpyridinium (CEPACOL) lozenge 3 mg  1 lozenge Oral PRN [name], MD       Or  . phenol (CHLORASEPTIC) mouth spray 1 spray  1 spray Mouth/Throat PRN [name], MD      . multivitamin with minerals tablet 1 tablet  1 tablet Oral Daily [name], MD  1 tablet at 11/29/20 1017  . Muscle Rub CREA   Topical PRN Blenda Nicely, RPH      . nitroGLYCERIN (NITROSTAT) SL tablet 0.4 mg  0.4 mg Sublingual Q5 min PRN [name], MD      . ondansetron [facility]) tablet 4 mg  4 mg Oral Q6H PRN [name], MD       Or  . ondansetron [facility]) injection 4 mg  4 mg Intravenous Q6H PRN [name], MD   4 mg at 11/24/20 2238  . oxyCODONE (Oxy IR/ROXICODONE) immediate release tablet 10 mg  10 mg Oral Q3H PRN [name], MD   10 mg at 11/29/20 1211  . pantoprazole (PROTONIX) EC tablet 40 mg  40 mg Oral QHS Pierce, Dwayne A, RPH   40 mg at 11/28/20 2006  . polyethylene glycol (MIRALAX / GLYCOLAX) packet 17 g  17 g Oral BID PRN [name], MD   17 g at 11/28/20 2007  . polyvinyl alcohol (LIQUIFILM TEARS) 1.4 % ophthalmic solution 1 drop  1 drop Both Eyes PRN Blenda Nicely, RPH      . rosuvastatin (CRESTOR) tablet 5 mg  5 mg Oral Daily [name], MD   5 mg at 11/29/20 1017  . sodium chloride (OCEAN) 0.65 % nasal spray 1 spray  1 spray Each Nare QHS PRN [name], MD      . sodium chloride flush (NS) 0.9 % injection 3 mL  3 mL Intravenous Q12H [name], MD   3 mL at 11/29/20 1019  . sodium chloride flush (NS) 0.9 % injection 3 mL  3 mL Intravenous PRN [name], MD         Discharge Medications: Please see discharge summary for a list of discharge medications.  Relevant  Imaging Results:  Relevant Lab Results:   Additional Information SS#: [id number]  Geralynn Ochs, LCSW

## 2020-11-29 NOTE — Care Management Important Message (Signed)
Important Message  Patient Details  Name: [name] Pratt MRN: [id number] Date of Birth: 29-Aug-1941   Medicare Important Message Given:  Yes     [name] Pratt 11/29/2020, 3:43 PM

## 2020-11-29 NOTE — TOC Progression Note (Signed)
Transition of Care [facility]) - Progression Note    Patient Details  Name: [name] Pratt MRN: [id number] Date of Birth: December 13, 1940  Transition of Care [facility]) CM/SW [place], [place] Phone Number: 11/29/2020, 1:03 PM  Clinical Narrative:   CSW spoke with patient's wife, [name] Pratt, via phone to discuss updated therapy recs for SNF. Barbara in agreement, but does not want him going to [facility] because a friend had a bad experience there. [name] Pratt unaware of other rehab venue options, but would like to keep him as close to home as possible. CSW completed referral and faxed out for SNF, will follow up with bed offers.    Expected Discharge Plan: [place] Barriers to Discharge: Continued Medical Work up,Insurance Authorization  Expected Discharge Plan and Services Expected Discharge Plan: [place]   Discharge Planning Services: CM Consult Post Acute Care Choice: [place] Living arrangements for the past 2 months: [place]: PT,OT [place] Agency: [place] Date [place]: 11/25/20   Representative spoke with at [place]: [place] Interventions    Readmission Risk Interventions No flowsheet data found.

## 2020-11-29 NOTE — Progress Notes (Signed)
Subjective: Patient reports not much neck pain but is hallucinating  Objective: Vital signs in last 24 hours: Temp:  [98.1 F (36.7 C)-98.6 F (37 C)] 98.1 F (36.7 C) (03/21 1100) Pulse Rate:  [63-70] 67 (03/21 1100) Resp:  [16-19] 16 (03/21 0700) BP: (143-156)/(70-77) 144/71 (03/21 1100) SpO2:  [96 %-98 %] 98 % (03/21 1100)  Intake/Output from previous day: 03/20 0701 - 03/21 0700 In: 237 [P.O.:237] Out: 2450 [Urine:2450] Intake/Output this shift: No intake/output data recorded.  Neurologic: Grossly normal  Lab Results: Lab Results  Component Value Date   WBC 7.1 11/22/2020   HGB 13.0 11/22/2020   HCT 40.5 11/22/2020   MCV 100.5 (H) 11/22/2020   PLT 306 11/22/2020   Lab Results  Component Value Date   INR 1.06 12/11/2012   BMET Lab Results  Component Value Date   NA 143 11/22/2020   K 4.1 11/22/2020   CL 106 11/22/2020   CO2 30 11/22/2020   GLUCOSE 106 (H) 11/22/2020   BUN 16 11/22/2020   CREATININE 1.26 (H) 11/22/2020   CALCIUM 9.3 11/22/2020    Studies/Results: No results found.  Assessment/Plan: Postop day 5 posterior cervical fusion. Continue therapy. Awaiting SNF placement.   LOS: 5 days    Ocie Cornfield [facility] 11/29/2020, 1:33 PM

## 2020-11-30 DIAGNOSIS — R441 Visual hallucinations: Secondary | ICD-10-CM | POA: Diagnosis not present

## 2020-11-30 DIAGNOSIS — G959 Disease of spinal cord, unspecified: Secondary | ICD-10-CM | POA: Diagnosis not present

## 2020-11-30 LAB — TSH: TSH: 5.516 u[IU]/mL — ABNORMAL HIGH (ref 0.350–4.500)

## 2020-11-30 LAB — URINALYSIS, ROUTINE W REFLEX MICROSCOPIC
Bacteria, UA: NONE SEEN
Bilirubin Urine: NEGATIVE
Glucose, UA: NEGATIVE mg/dL
Ketones, ur: NEGATIVE mg/dL
Leukocytes,Ua: NEGATIVE
Nitrite: NEGATIVE
Protein, ur: NEGATIVE mg/dL
Specific Gravity, Urine: 1.006 (ref 1.005–1.030)
pH: 7 (ref 5.0–8.0)

## 2020-11-30 LAB — COMPREHENSIVE METABOLIC PANEL
ALT: 15 U/L (ref 0–44)
AST: 26 U/L (ref 15–41)
Albumin: 3.2 g/dL — ABNORMAL LOW (ref 3.5–5.0)
Alkaline Phosphatase: 77 U/L (ref 38–126)
Anion gap: 9 (ref 5–15)
BUN: 23 mg/dL (ref 8–23)
CO2: 28 mmol/L (ref 22–32)
Calcium: 8.9 mg/dL (ref 8.9–10.3)
Chloride: 98 mmol/L (ref 98–111)
Creatinine, Ser: 1.22 mg/dL (ref 0.61–1.24)
GFR, Estimated: 60 mL/min — ABNORMAL LOW (ref >60)
Glucose, Bld: 129 mg/dL — ABNORMAL HIGH (ref 70–99)
Potassium: 3.1 mmol/L — ABNORMAL LOW (ref 3.5–5.1)
Sodium: 135 mmol/L (ref 135–145)
Total Bilirubin: 0.9 mg/dL (ref 0.3–1.2)
Total Protein: 6.8 g/dL (ref 6.5–8.1)

## 2020-11-30 LAB — CBC
HCT: 37.1 % — ABNORMAL LOW (ref 39.0–52.0)
Hemoglobin: 12.6 g/dL — ABNORMAL LOW (ref 13.0–17.0)
MCH: 32.4 pg (ref 26.0–34.0)
MCHC: 34 g/dL (ref 30.0–36.0)
MCV: 95.4 fL (ref 80.0–100.0)
Platelets: 385 10*3/uL (ref 150–400)
RBC: 3.89 MIL/uL — ABNORMAL LOW (ref 4.22–5.81)
RDW: 12.5 % (ref 11.5–15.5)
WBC: 9.1 10*3/uL (ref 4.0–10.5)
nRBC: 0 % (ref 0.0–0.2)

## 2020-11-30 LAB — MAGNESIUM: Magnesium: 1.9 mg/dL (ref 1.7–2.4)

## 2020-11-30 LAB — GLUCOSE, CAPILLARY
Glucose-Capillary: 150 mg/dL — ABNORMAL HIGH (ref 70–99)
Glucose-Capillary: 122 mg/dL — ABNORMAL HIGH (ref 70–99)
Glucose-Capillary: 142 mg/dL — ABNORMAL HIGH (ref 70–99)
Glucose-Capillary: 169 mg/dL — ABNORMAL HIGH (ref 70–99)

## 2020-11-30 MED ORDER — HALOPERIDOL LACTATE 5 MG/ML IJ SOLN: 2.0000 mg | Freq: Four times a day (QID) | INTRAMUSCULAR | Status: DC | PRN

## 2020-11-30 MED ORDER — POTASSIUM CHLORIDE CRYS ER 20 MEQ PO TBCR
40.0000 meq | EXTENDED_RELEASE_TABLET | Freq: Once | ORAL | Status: AC
  Administered 2020-11-30: 40 meq via ORAL
  Filled 2020-11-30: qty 2

## 2020-11-30 NOTE — Progress Notes (Signed)
Inpatient Diabetes Program Recommendations  AACE/ADA: New Consensus Statement on Inpatient Glycemic Control (2015)  Target Ranges:  Prepandial:   less than 140 mg/dL      Peak postprandial:   less than 180 mg/dL (1-2 hours)      Critically ill patients:  140 - 180 mg/dL   Lab Results  Component Value Date   GLUCAP 150 (H) 11/30/2020   HGBA1C 5.3 09/29/2020    Review of Glycemic Control Results for TRELON, PLUSHFRED" (MRN [id number]) as of 11/30/2020 10:26  Ref. Range 11/29/2020 06:19 11/29/2020 11:32 11/29/2020 16:25 11/29/2020 22:05 11/30/2020 06:29  Glucose-Capillary Latest Ref Range: 70 - 99 mg/dL 122 (H) 320 (H) 78 163 (H) 150 (H)   Inpatient Diabetes Program Recommendations:   Consider decrease in Novolog correction to 0-9 units tid + hs 0-5 units  Thank you, Bethena Roys E. Breezie Micucci, RN, MSN, CDE  Diabetes Coordinator Inpatient Glycemic Control Team Team Pager [phone] (8am-5pm) 11/30/2020 10:27 AM

## 2020-11-30 NOTE — Consult Note (Addendum)
Medical Consultation   [name] Pratt  ZOX:[id number]  DOB: 02/27/1941  DOA: 11/24/2020  PCP: [name], NP    Requesting physician: [name]  Reason for consultation: Hallucinations   History of Present Illness: This is a 80 y.o. male with a past medical history of carotid stenosis, COPD, OSA, hypertension, hyperlipidemia, cervical spondylitic myelopathy who was admitted on 11/24/20 by [name], neurosurgery, for numbness and tingling in his hands and difficulty walking. Patient underwent anterior cervical discectomy and fusion to decompress the anterior part of the spinal cord though neurosurgery noted he still had some posterior compression on follow up imaging and so posterior cervical decompression with lateral mass fixation was recommended. Patient underwent decompressive cervical laminectomy C3-4 and C4-5, posterior lateral fusion with lateral mass screw placement at C3, C4 and C5 on the right and C3 and C5 on the left and posterior lateral arthrodesis C3-4 and C4-5 on 11/24/20 with [name]. Since that time, the patient had urinary retention noted on 3/17 requiring foley. He has also had improvement in his presenting symptoms but has been requiring opiates for pain management. Plan was made for patient to be discharged to SNF when able as his wife is unlikely able to handle his care at home. He was noted to be hallucinating on 3/21 which prompted internal medicine consult.   Currently, the patient is resting comfortably. He states that yesterday and this afternoon he had visual hallucinations. He saw ants crawling on the ceiling and also thought that his wife was on the floor. He does not have auditory hallucinations. States that these hallucinations happen when he wakes up and they are not frightening but he does get nervous with them, as if something is wrong. This has not happened in the past, only since being hospitalized. He has been receiving Oxycodone four  times daily for the past several days, last dose was yesterday at noon. He also received dilaudid last night around 11:30 pm. He reports a history of a UTI one year ago but does not remember his symptoms at the time.  Review of Systems:  Review of Systems  All other systems reviewed and are negative.  As per HPI otherwise 10 point review of systems negative.     Past Medical History: Past Medical History:  Diagnosis Date   Aortic atherosclerosis ([place]) 03/05/2020   Arthritis    Cancer ([place])    skin cancer   Carotid stenosis    a. Carotid U/S 8/11: LICA < 91%, RICA 47-82%;  b.  Carotid U/S 9/56:  RICA 21-30%; LICA 8-65%; f/u 1 year   Chest pain     Myoview in 2008 was normal.  Echo 7/09: EF 60%, normal wall motion, mild LVH, mild LAE.   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    Past Surgical History: Past Surgical History:  Procedure Laterality Date   ABDOMINAL HERNIA REPAIR  07/15/12   Dr [name]   ANTERIOR CERVICAL DECOMP/DISCECTOMY FUSION N/A 07/09/2020   Procedure: ANTERIOR CERVICAL DECOMPRESSION AND FUSION CERVICAL THREE-FOUR.;  Surgeon: [name], MD;  Location: [facility];  Service: Neurosurgery;  Laterality: N/A;  anterior   BROW LIFT  05/07/01   CYSTOSCOPY WITH URETEROSCOPY AND STENT PLACEMENT Right 01/28/2014   Procedure: CYSTOSCOPY WITH URETEROSCOPY, BASKET RETRIVAL AND  STENT PLACEMENT;  Surgeon:  [name], MD;  Location: [facility];  Service: Urology;  Laterality: Right;   epidural injections     multiple procedures   ESOPHAGEAL DILATION  1993 and 1994   multiple times   EYE SURGERY Bilateral 03/25/10, 2012   cataract removal   HEMORRHOID SURGERY  2014   HIATAL HERNIA REPAIR  12/21/93   HOLMIUM LASER APPLICATION Right 4/85/4627   Procedure: HOLMIUM LASER APPLICATION;  Surgeon: [name], MD;  Location: [facility];  Service: Urology;  Laterality: Right;   INGUINAL HERNIA REPAIR Right 07/15/12   Dr [name], x2   JOINT REPLACEMENT  07/25/04   right knee   JOINT REPLACEMENT  07/13/10   left knee   [place]  09/20/06   with intraoperative cholangiogram and right inguinal herniorrhaphy with mesh    LIGAMENT REPAIR Left 07/2013   shoulder   LITHOTRIPSY     x2   [place] FUSION/FORAMINOTOMY N/A 11/24/2020   Procedure: Posterior Cervical Laminectomy Cervical three-four, Cervical four-five with lateral mass fusion;  Surgeon: [name], MD;  Location: [facility];  Service: Neurosurgery;  Laterality: N/A;   REFRACTIVE SURGERY Bilateral    Right total hip replacement  4/14   SKIN CANCER DESTRUCTION     nose, ear   TONSILLECTOMY  as child   TOTAL HIP ARTHROPLASTY Left    URETHRAL DILATION  1991   [name]     Allergies:   Allergies  Allergen Reactions   Pravastatin Other (See Comments)    Muscle cramps   Sildenafil Other (See Comments)    Tachycardia     Atenolol Other (See Comments)    bradycardia   Elastic Bandages & [Zinc] Other (See Comments)    Teflon bandages - Irritation   Metoclopramide Hcl Anxiety     Social History:  reports that he quit smoking about 42 years ago. His smoking use included cigarettes. He quit after 11.00 years of use. He has never used smokeless tobacco. He reports previous alcohol use. He reports that he does not use drugs.   Family  History: Family History  Problem Relation Age of Onset   Cancer Mother    Hypertension Other    Cancer Other    Osteoarthritis Other      Physical Exam: Vitals:   11/30/20 0700 11/30/20 0758 11/30/20 1100 11/30/20 1500  BP: (!) 151/129 (!) 151/76 (!) 158/69 (!) 146/90  Pulse: 64 66 71 80  Resp: 16  18 15   Temp: 98.5 F (36.9 C)  (!) 97.5 F (36.4 C) 98.2 F (36.8 C)  TempSrc: Oral  Oral Oral  SpO2: 98%  98% 98%  Weight:      Height:        Physical Exam Vitals and nursing note reviewed. Exam conducted with a chaperone present.  Constitutional:  General: He is not in acute distress.    Appearance: Normal appearance. He is not ill-appearing.  HENT:     Head: Normocephalic and atraumatic.     Mouth/Throat:     Mouth: Mucous membranes are moist.  Eyes:     Conjunctiva/sclera: Conjunctivae normal.  Cardiovascular:     Rate and Rhythm: Normal rate and regular rhythm.  Pulmonary:     Effort: Pulmonary effort is normal.     Breath sounds: Normal breath sounds.  Abdominal:     General: Abdomen is flat. There is no distension.     Palpations: Abdomen is soft.  Musculoskeletal:        General: No swelling or tenderness.  Skin:    Coloration: Skin is not jaundiced or pale.  Neurological:     Mental Status: He is alert.     Comments: Awake, alert, oriented to person, place, time. Unable to remember the name of the current president of the [place] is but was aware of the previous president.   Psychiatric:        Mood and Affect: Mood normal.        Behavior: Behavior normal.         Data reviewed:  I have personally reviewed following labs and imaging studies Labs:  CBC: No results for input(s): WBC, NEUTROABS, HGB, HCT, MCV, PLT in the last 168 hours.  Basic Metabolic Panel: No results for input(s): NA, K, CL, CO2, GLUCOSE, BUN, CREATININE, CALCIUM, MG, PHOS in the last 168 hours. GFR Estimated Creatinine Clearance: 42.2 mL/min (A) (by C-G formula based on  SCr of 1.26 mg/dL (H)). Liver Function Tests: No results for input(s): AST, ALT, ALKPHOS, BILITOT, PROT, ALBUMIN in the last 168 hours. No results for input(s): LIPASE, AMYLASE in the last 168 hours. No results for input(s): AMMONIA in the last 168 hours. Coagulation profile No results for input(s): INR, PROTIME in the last 168 hours.  Cardiac Enzymes: No results for input(s): CKTOTAL, CKMB, CKMBINDEX, TROPONINI in the last 168 hours. BNP: Invalid input(s): POCBNP CBG: Recent Labs  Lab 11/29/20 1625 11/29/20 2205 11/30/20 0629 11/30/20 1123 11/30/20 1603  GLUCAP 78 163* 150* 122* 142*   D-Dimer No results for input(s): DDIMER in the last 72 hours. Hgb A1c No results for input(s): HGBA1C in the last 72 hours. Lipid Profile No results for input(s): CHOL, HDL, LDLCALC, TRIG, CHOLHDL, LDLDIRECT in the last 72 hours. Thyroid function studies No results for input(s): TSH, T4TOTAL, T3FREE, THYROIDAB in the last 72 hours.  Invalid input(s): FREET3 Anemia work up No results for input(s): VITAMINB12, FOLATE, FERRITIN, TIBC, IRON, RETICCTPCT in the last 72 hours. Urinalysis    Component Value Date/Time   COLORURINE YELLOW 03/04/2020 0136   APPEARANCEUR CLEAR 03/04/2020 0136   LABSPEC 1.015 03/04/2020 0136   PHURINE 5.5 03/04/2020 0136   GLUCOSEU NEGATIVE 03/04/2020 0136   HGBUR NEGATIVE 03/04/2020 0136   BILIRUBINUR NEGATIVE 03/04/2020 0136   BILIRUBINUR neg 02/07/2019 1103   KETONESUR NEGATIVE 03/04/2020 0136   PROTEINUR NEGATIVE 03/04/2020 0136   UROBILINOGEN 0.2 02/07/2019 1103   UROBILINOGEN 0.2 01/17/2014 0451   NITRITE NEGATIVE 03/04/2020 0136   LEUKOCYTESUR NEGATIVE 03/04/2020 0136     Microbiology Recent Results (from the past 240 hour(s))  Surgical pcr screen     Status: None   Collection Time: 11/22/20  1:30 PM   Specimen: Nasal Mucosa; Nasal Swab  Result Value Ref Range Status   MRSA, PCR NEGATIVE NEGATIVE Final   Staphylococcus aureus NEGATIVE NEGATIVE  Final    Comment: (NOTE) The Xpert SA Assay (FDA approved for NASAL specimens in patients 60 years of age and older), is one component of a comprehensive surveillance program. It is not intended to diagnose infection nor to guide or monitor treatment. Performed at [facility], [place] [address], [place], [place] [address]   SARS CORONAVIRUS 2 (TAT 6-24 HRS) Nasopharyngeal Nasopharyngeal Swab     Status: None   Collection Time: 11/22/20  1:30 PM   Specimen: Nasopharyngeal Swab  Result Value Ref Range Status   SARS Coronavirus 2 NEGATIVE NEGATIVE Final    Comment: (NOTE) SARS-CoV-2 target nucleic acids are NOT DETECTED.  The SARS-CoV-2 RNA is generally detectable in upper and lower respiratory specimens during the acute phase of infection. Negative results do not preclude SARS-CoV-2 infection, do not rule out co-infections with other pathogens, and should not be used as the sole basis for treatment or other patient management decisions. Negative results must be combined with clinical observations, patient history, and epidemiological information. The expected result is Negative.  Fact Sheet for Patients: SugarRoll.be  Fact Sheet for Healthcare Providers: https://www.woods-mathews.com/  This test is not yet approved or cleared by the [place] FDA and  has been authorized for detection and/or diagnosis of SARS-CoV-2 by FDA under an Emergency Use Authorization (EUA). This EUA will remain  in effect (meaning this test can be used) for the duration of the COVID-19 declaration under Se ction 564(b)(1) of the Act, 21 U.S.C. section 360bbb-3(b)(1), unless the authorization is terminated or revoked sooner.  Performed at [facility], [place] [address], [place], [place] [address]        Inpatient Medications:   Scheduled Meds:  acetaminophen  650 mg Oral TID   allopurinol  100 mg Oral BID   amLODipine  5 mg Oral Daily    aspirin EC  81 mg Oral q morning   Chlorhexidine Gluconate Cloth  6 each Topical Daily   cholecalciferol  2,000 Units Oral Daily   cycloSPORINE  1 drop Both Eyes BID   gabapentin  300 mg Oral QHS   hydrALAZINE  50 mg Oral BID   insulin aspart  0-15 Units Subcutaneous TID WC   levothyroxine  150 mcg Oral Once per day on Mon Tue Wed Thu Fri Sat   levothyroxine  75 mcg Oral Q Sun   losartan  100 mg Oral QHS   multivitamin with minerals  1 tablet Oral Daily   pantoprazole  40 mg Oral QHS   rosuvastatin  5 mg Oral Daily   sodium chloride flush  3 mL Intravenous Q12H   Continuous Infusions:  sodium chloride       Radiological Exams on Admission: No results found.  Impression/Recommendations Active Problems:   Myelopathy (HCC)   Visual hallucinations    1. Visual hallucinations of unclear etiology a. Currently without hallucinations and appears at/near baseline b. These could be from sedating medications (opiates, gabapentin, flexeril, etc.) vs. post anesthesia vs. early hospital acquired delirium in the setting of waking up in an unfamiliar place vs. AKI c. Will check CBC, CMP, TSH, Mg, UA i. Addendum: labs overall unremarkable. Would limit the amount of sedating medications the patient is getting as this is likely the main contributing factor d. Delirium precautions e. Minimize sedating medications and orient at bedside as needed  2. Urinary retention a. Continue foley b. Management per primary  3. Cervical myelopathy s/p posterior cervical fusion, POD 6 a. Per primary team  4. Hypertension a. Check renal function as he has been taking losartan b. Continue amlodipine  5. Hyperlipidemia a. Continue statin  6. Hypothyroidism a. Continue synthroid b. Check TSH  7. COPD, not in acute exacerbation   Thank you for this consultation.  Our [facility] hospitalist team will follow the patient with you.   Time Spent: 35 minutes  Harold Hedge D.O.  [facility] 11/30/2020, 4:44 PM

## 2020-11-30 NOTE — Progress Notes (Signed)
Subjective: Patient reports some neck pain but doing well  Objective: Vital signs in last 24 hours: Temp:  [97.9 F (36.6 C)-98.5 F (36.9 C)] 98.5 F (36.9 C) (03/22 0700) Pulse Rate:  [64-88] 64 (03/22 0700) Resp:  [16-18] 16 (03/22 0700) BP: (129-155)/(71-129) 151/129 (03/22 0700) SpO2:  [97 %-99 %] 98 % (03/22 0700)  Intake/Output from previous day: 03/21 0701 - 03/22 0700 In: 240 [P.O.:240] Out: 1900 [Urine:1900] Intake/Output this shift: No intake/output data recorded.  Neurologic: Grossly normal  Lab Results: Lab Results  Component Value Date   WBC 7.1 11/22/2020   HGB 13.0 11/22/2020   HCT 40.5 11/22/2020   MCV 100.5 (H) 11/22/2020   PLT 306 11/22/2020   Lab Results  Component Value Date   INR 1.06 12/11/2012   BMET Lab Results  Component Value Date   NA 143 11/22/2020   K 4.1 11/22/2020   CL 106 11/22/2020   CO2 30 11/22/2020   GLUCOSE 106 (H) 11/22/2020   BUN 16 11/22/2020   CREATININE 1.26 (H) 11/22/2020   CALCIUM 9.3 11/22/2020    Studies/Results: No results found.  Assessment/Plan: Postop day 6 posterior cervical fusion. Awaiting SNF placement. Therapy today   LOS: 6 days    Ocie Cornfield [facility] 11/30/2020, 8:07 AM

## 2020-11-30 NOTE — Progress Notes (Signed)
Physical Therapy Treatment Patient Details Name: [name] Pratt MRN: [id number] DOB: [birth year] Today's Date: 11/30/2020    History of Present Illness Pt is an 80 y/o male presenting s/p C3-C5 posterior cervical fusion on 11/24/20. PMHx significant for ACDF C3-4 on 07/09/20, skin cancer, COPD, HTN, bilateral TKA and THA.    PT Comments    The pt was eager to participate in PT session with focus on progression of OOB mobility, ambulation, and standing balance. The pt continues to have difficulties with recalling cervical precautions and applying precautions to mobility. He requires minA to maintain precautions with bed mobility, and min/modA to maintain seated balance with initial transfer to sitting EOB due to posterior lean. The pt continues to have deficits in standing stability, RLE strength, and endurance that resulted in multiple posterior LOB with short gait as well as progressive difficulty advancing RLE. The pt will benefit from further skilled therapy to address standing balance, posture, RLE strength, and ability to apply precautions to mobility.    Follow Up Recommendations  SNF;Supervision/Assistance - 24 hour     Equipment Recommendations   (defer to post acute)    Recommendations for Other Services       Precautions / Restrictions Precautions Precautions: Cervical Precaution Booklet Issued: Yes (comment) Precaution Comments: requires cues and repeated cueing for positioning with mobility Required Braces or Orthoses:  (no brace needed) Restrictions Weight Bearing Restrictions: No    Mobility  Bed Mobility Overal bed mobility: Needs Assistance Bed Mobility: Rolling;Sit to Sidelying Rolling: Min assist Sidelying to sit: Min assist;HOB elevated       General bed mobility comments: hand-over-hand to complete rolling while maintaining precautions minA with HOB elevated, but min/modA to maintain initial sit due to posterior LOB when not supported. Cues to place BLE  on ground and maintain    Transfers Overall transfer level: Needs assistance Equipment used: Rolling walker (2 wheeled) Transfers: Sit to/from Stand Sit to Stand: Mod assist         General transfer comment: modA with heavy verbal cues. x2 from EOB, x5 from recliner. cues for hand positioning and technique each rep. modA to power up, pt with strong posterior lean, cues to maintain toes on ground  Ambulation/Gait Ambulation/Gait assistance: Min assist;Mod assist Gait Distance (Feet): 30 Feet Assistive device: Rolling walker (2 wheeled) Gait Pattern/deviations: Step-through pattern;Decreased step length - right;Decreased stride length;Decreased dorsiflexion - right;Leaning posteriorly;Trunk flexed;Narrow base of support Gait velocity: Decreased   General Gait Details: small steps with progressive difficulty with advancement of RLE. decreased hip flexion, DF, and therefore clearance with ambulaiton in room. Posterior lean at times reqwuiring modA to maintain upright. repeated cues for posture/head positioning       Balance Overall balance assessment: Needs assistance Sitting-balance support: No upper extremity supported;Feet supported Sitting balance-Leahy Scale: Fair Sitting balance - Comments: initially modA with cues to maintain feet on ground to reduce posterior lean. able to progress to minG without UE support Postural control: Posterior lean Standing balance support: Bilateral upper extremity supported;During functional activity Standing balance-Leahy Scale: Poor Standing balance comment: relies on BUE and external support, cueing for posture and poor tolerance; posterior LOB x3 during longer gait trial. with static standing pt with posterior/R lateral lean                            Cognition Arousal/Alertness: Awake/alert Behavior During Therapy: Anxious Overall Cognitive Status: Impaired/Different from baseline Area of Impairment:  Orientation;Memory;Safety/judgement;Awareness;Problem solving  Orientation Level: Disoriented to;Place;Time;Situation [employer]" pt unable to identify this as hospital)   Memory: Decreased short-term memory;Decreased recall of precautions   Safety/Judgement: Decreased awareness of safety;Decreased awareness of deficits Awareness: Intellectual Problem Solving: Slow processing;Requires verbal cues General Comments: Pt describing situation involving how he walked through the woods this morning upon PT arrival, later stating he was unsure if it was this morning or not. Pt then able to follow commands, but unable to maintain precautions or posture without repeated cues.      Exercises      General Comments General comments (skin integrity, edema, etc.): VSS on RA      Pertinent Vitals/Pain Pain Assessment: Faces Faces Pain Scale: Hurts even more Pain Location: bil. shoulders and neck by end of gait training Pain Descriptors / Indicators: Aching;Discomfort;Operative site guarding;Shooting;Sharp Pain Intervention(s): Limited activity within patient's tolerance;Monitored during session;Repositioned           PT Goals (current goals can now be found in the care plan section) Acute Rehab PT Goals Patient Stated Goal: "Finally get to rehab" PT Goal Formulation: With patient Time For Goal Achievement: 12/09/20 Potential to Achieve Goals: Good Progress towards PT goals: Progressing toward goals    Frequency    Min 5X/week      PT Plan Current plan remains appropriate       AM-PAC PT "6 Clicks" Mobility   Outcome Measure  Help needed turning from your back to your side while in a flat bed without using bedrails?: A Little Help needed moving from lying on your back to sitting on the side of a flat bed without using bedrails?: A Lot Help needed moving to and from a bed to a chair (including a wheelchair)?: A Little Help needed standing up from a chair  using your arms (e.g., wheelchair or bedside chair)?: A Lot     6 Click Score: 10    End of Session Equipment Utilized During Treatment: Gait belt Activity Tolerance: Patient tolerated treatment well;Patient limited by fatigue Patient left: with call bell/phone within reach;in chair;with chair alarm set Nurse Communication: Mobility status PT Visit Diagnosis: Unsteadiness on feet (R26.81);Other abnormalities of gait and mobility (R26.89);Muscle weakness (generalized) (M62.81)     Time: 0923-3007 PT Time Calculation (min) (ACUTE ONLY): 31 min  Charges:  $Gait Training: 8-22 mins $Neuromuscular Re-education: 8-22 mins                     Karma Ganja, PT, DPT   Acute Rehabilitation Department Pager #: [phone]   Otho Bellows 11/30/2020, 8:51 AM

## 2020-11-30 NOTE — Progress Notes (Signed)
Pt placed on home cpap unit.

## 2020-11-30 NOTE — TOC Progression Note (Addendum)
Transition of Care [facility]) - Progression Note    Patient Details  Name: [name] Pratt MRN: [id number] Date of Birth: 1940-12-15  Transition of Care [facility]) CM/SW Contact  Marney Setting, [place] Work Phone Number: 11/30/2020, 11:41 AM  Clinical Narrative:   MSW Student provided choice list to patient who chose to go to [facility]. Patient seemed distraught and distracted. Will inform patients spouse of choice and start insurance.   UPDATE: CSW spoke with Pamala Hurry via phone, she is also in agreement with [employer]. Pamala Hurry expressed concern because patient was talking about seeing things that weren't there today and he was really emotional, which isn't like him. CSW explained that it could be from being in the hospital or the medicine that he's taking for pain. CSW expressed concern about hallucinations to MD, they think it's likely hospital delirium but will have hospitalist take a look before discharge. CSW to follow.    Expected Discharge Plan: [place] Barriers to Discharge: Continued Medical Work up,Insurance Authorization  Expected Discharge Plan and Services Expected Discharge Plan: [place]   Discharge Planning Services: CM Consult Post Acute Care Choice: [place] Living arrangements for the past 2 months: [place]: PT,OT [place] Agency: [place] Date [place]: 11/25/20   Representative spoke with at [place]: [place] ([place]) Interventions    Readmission Risk Interventions No flowsheet data found.

## 2020-11-30 NOTE — Progress Notes (Incomplete)
Patient ID: [name] Pratt, male   DOB: 03/13/1941, 80 y.o.   MRN: [id number]     CARDIOLOGY OFFICE NOTE  Date:  11/30/2020    [name] Pratt Date of Birth: [birth year] Medical Record #[id number]  PCP:  [name], NP  Cardiologist:  Johnsie Cancel    No chief complaint on file.   History of Present Illness:  80 y.o. history of HLD, GERD, left bruit , HTN and COPD. No history of CAD with normal myovue in 2008 and may of 2017. Last TTE done June 2017 with EF 55-60% AV sclerosis Duplex done March 2021 with new criteria read as plaque no stenosis bilaterally. 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   03/11/18 Atypical fleeting sharp chest pains Not exertional more relieved with his inhaler And he thinks its related to his COPD no further issues  March had UTI  Has lost 30 lbs Left foot swelling / pain with anterior tibialis tendon tear by [place] seen by Dr [facility]ef[facility] Suggested starting gabapentin for night cramps  And increasing allopurinol for gout and elevated uric acid He had ABI"s  11/03/19 non-compressible but wave forms mostly tri phasic   Has had COVID vaccine Wife has CAD and sees [facility] cardiologist May want to transfer here No angina compliant with meds   Having more back pain with arm  weakness MRI done 06/05/20 showed significant Foraminal stenosis referred to neuro surgery 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 Aortic atherosclerosis ([place]) 03/05/2020  . Arthritis   . Cancer ([place])    skin cancer  . Carotid stenosis    a. Carotid U/S 9/56: LICA < 38%, RICA 75-64%;  b.  Carotid U/S 5/14:  RICA  33-29%; LICA 5-18%; f/u 1 year  . Chest pain     Myoview in 2008 was normal.  Echo 7/09: EF 60%, normal wall motion, mild LVH, mild LAE.  [name] Complication of anesthesia    "stomach does not wake up"  . COPD (chronic obstructive pulmonary disease) ([place])   . Emphysema, unspecified ([place]) 03/05/2020  . GERD (gastroesophageal reflux disease)   . Gout   . History of chicken pox   . History of hemorrhoids   . History of kidney stones   . Hyperlipidemia    under control  . Hypertension    under control  . Hypothyroidism   . Ileus ([place])   . Inguinal hernia   . OSA (obstructive sleep apnea)   . Pancreatitis   . PONV (postoperative nausea and vomiting)   . Rosacea   . 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    Past Surgical History:  Procedure Laterality Date  . ABDOMINAL HERNIA REPAIR  07/15/12   Dr [name]  . ANTERIOR CERVICAL DECOMP/DISCECTOMY FUSION N/A 07/09/2020   Procedure: ANTERIOR CERVICAL DECOMPRESSION AND FUSION CERVICAL THREE-FOUR.;  Surgeon: [name], MD;  Location: [place];  Service: Neurosurgery;  Laterality: N/A;  anterior  . BROW LIFT  05/07/01  . CYSTOSCOPY WITH URETEROSCOPY AND STENT PLACEMENT Right 01/28/2014   Procedure:  CYSTOSCOPY WITH URETEROSCOPY, BASKET RETRIVAL AND  STENT PLACEMENT;  Surgeon: [name], MD;  Location: [facility];  Service: Urology;  Laterality: Right;  . epidural injections     multiple procedures  . [place]   multiple times  . EYE SURGERY Bilateral 03/25/10, 2012   cataract removal  . HEMORRHOID SURGERY  2014  . HIATAL HERNIA REPAIR  12/21/93  . HOLMIUM LASER APPLICATION Right 9/67/5916   Procedure: HOLMIUM LASER APPLICATION;  Surgeon: [name], MD;  Location: [facility];  Service: Urology;  Laterality: Right;  . INGUINAL HERNIA REPAIR Right 07/15/12   Dr [name], x2  . JOINT REPLACEMENT  07/25/04   right knee  . JOINT REPLACEMENT  07/13/10   left knee  . KNEE SURGERY  1995   . LAPAROSCOPIC CHOLECYSTECTOMY  09/20/06   with intraoperative cholangiogram and right inguinal herniorrhaphy with mesh   . LIGAMENT REPAIR Left 07/2013   shoulder  . LITHOTRIPSY     x2  . NASAL SEPTUM SURGERY  1975  . POSTERIOR CERVICAL FUSION/FORAMINOTOMY N/A 11/24/2020   Procedure: Posterior Cervical Laminectomy Cervical three-four, Cervical four-five with lateral mass fusion;  Surgeon: [name], MD;  Location: [place];  Service: Neurosurgery;  Laterality: N/A;  . REFRACTIVE SURGERY Bilateral   . 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 SKIN CANCER DESTRUCTION     nose, ear  . TONSILLECTOMY  as child  . TOTAL HIP ARTHROPLASTY Left   . URETHRAL DILATION  1991   [name]     Medications: No current facility-administered medications for this visit.   No current outpatient medications on file.   Facility-Administered Medications Ordered in Other Visits  Medication Dose Route Frequency Provider Last Rate Last Admin  . 0.9 %  sodium chloride infusion  250 mL Intravenous Continuous [name], MD      . acetaminophen (TYLENOL) tablet 650 mg  650 mg Oral Q4H PRN [name], MD   650 mg at 11/30/20 0541   Or  . acetaminophen (TYLENOL) suppository 650 mg  650 mg Rectal Q4H PRN [name], MD      . acetaminophen (TYLENOL) tablet 650 mg  650 mg Oral TID [name], MD   650 mg at 11/29/20 2051  . albuterol (VENTOLIN HFA) 108 (90 Base) MCG/ACT inhaler 2 puff  2 puff Inhalation Q6H PRN [name], MD      . allopurinol (ZYLOPRIM) tablet 100 mg  100 mg Oral BID [name], MD   100 mg at 11/29/20 2051  . alum & mag hydroxide-simeth (MAALOX/MYLANTA) 200-200-20 MG/5ML suspension 30 mL  30 mL Oral Q6H PRN [name], MD      . amLODipine (NORVASC) tablet 5 mg  5 mg Oral Daily [name], MD   5 mg at 11/29/20 1017  . aspirin EC tablet 81 mg  81 mg Oral q morning [name], MD   81 mg at 11/29/20 1022  . bisacodyl (DULCOLAX) suppository 10 mg  10 mg Rectal Daily PRN [name], MD   10 mg at  11/29/20 2104  . Chlorhexidine Gluconate Cloth 2 % PADS 6 each  6 each Topical Daily [name], MD   6 each at 11/29/20 1028  . cholecalciferol (VITAMIN D3) tablet 2,000 Units  2,000 Units Oral Daily [name], MD   2,000 Units at 11/29/20 1017  . colchicine tablet 0.6 mg  0.6 mg Oral Daily PRN [name], MD   0.6 mg at  11/29/20 2051  . cyclobenzaprine (FLEXERIL) tablet 10 mg  10 mg Oral TID PRN [name], MD   10 mg at 11/29/20 1705  . cycloSPORINE (RESTASIS) 0.05 % ophthalmic emulsion 1 drop  1 drop Both Eyes BID [name], MD   1 drop at 11/29/20 2104  . docusate sodium (COLACE) capsule 100 mg  100 mg Oral Daily PRN [name], MD   100 mg at 11/29/20 2051  . gabapentin (NEURONTIN) capsule 300 mg  300 mg Oral QHS [name], MD   300 mg at 11/29/20 2051  . hydrALAZINE (APRESOLINE) tablet 50 mg  50 mg Oral BID [name], MD   50 mg at 11/29/20 2050  . HYDROmorphone (DILAUDID) injection 0.5 mg  0.5 mg Intravenous Q2H PRN [name], MD   0.5 mg at 11/29/20 2335  . insulin aspart (novoLOG) injection 0-15 Units  0-15 Units Subcutaneous TID WC [name], MD   2 Units at 11/30/20 [phone]  . levothyroxine (SYNTHROID) tablet 150 mcg  150 mcg Oral Once per day on Mon Tue Wed Thu Fri Sat [name], MD   150 mcg at 11/30/20 0541  . levothyroxine (SYNTHROID) tablet 75 mcg  75 mcg Oral Q Donnella Bi, RPH   75 mcg at 11/28/20 3846  . losartan (COZAAR) tablet 100 mg  100 mg Oral QHS [name], MD   100 mg at 11/29/20 2051  . menthol-cetylpyridinium (CEPACOL) lozenge 3 mg  1 lozenge Oral PRN [name], MD       Or  . phenol (CHLORASEPTIC) mouth spray 1 spray  1 spray Mouth/Throat PRN [name], MD      . multivitamin with minerals tablet 1 tablet  1 tablet Oral Daily [name], MD   1 tablet at 11/29/20 1017  . Muscle Rub CREA   Topical PRN Blenda Nicely, RPH      . nitroGLYCERIN (NITROSTAT) SL tablet 0.4 mg  0.4 mg Sublingual Q5 min PRN [name], MD      . ondansetron [facility]) tablet 4  mg  4 mg Oral Q6H PRN [name], MD       Or  . ondansetron [facility]) injection 4 mg  4 mg Intravenous Q6H PRN [name], MD   4 mg at 11/24/20 2238  . oxyCODONE (Oxy IR/ROXICODONE) immediate release tablet 10 mg  10 mg Oral Q3H PRN [name], MD   10 mg at 11/29/20 1211  . pantoprazole (PROTONIX) EC tablet 40 mg  40 mg Oral QHS Pierce, Dwayne A, RPH   40 mg at 11/29/20 2050  . polyethylene glycol (MIRALAX / GLYCOLAX) packet 17 g  17 g Oral BID PRN [name], MD   17 g at 11/29/20 2051  . polyvinyl alcohol (LIQUIFILM TEARS) 1.4 % ophthalmic solution 1 drop  1 drop Both Eyes PRN Blenda Nicely, RPH      . rosuvastatin (CRESTOR) tablet 5 mg  5 mg Oral Daily [name], MD   5 mg at 11/29/20 1017  . sodium chloride (OCEAN) 0.65 % nasal spray 1 spray  1 spray Each Nare QHS PRN [name], MD      . sodium chloride flush (NS) 0.9 % injection 3 mL  3 mL Intravenous Q12H [name], MD   3 mL at 11/29/20 2052  . sodium chloride flush (NS) 0.9 % injection 3 mL  3 mL Intravenous PRN [name], MD        Allergies: Allergies  Allergen Reactions  .  Pravastatin Other (See Comments)    Muscle cramps  . Sildenafil Other (See Comments)    Tachycardia    . Atenolol Other (See Comments)    bradycardia  . Elastic Bandages & [Zinc] Other (See Comments)    Teflon bandages - Irritation  . Metoclopramide Hcl Anxiety    Social History: The patient  reports that he quit smoking about 42 years ago. His smoking use included cigarettes. He quit after 11.00 years of use. He has never used smokeless tobacco. He reports previous alcohol use. He reports that he does not use drugs.   Family History: The patient's family history includes Cancer in his mother and another family member; Hypertension in an other family member; Osteoarthritis in an other family member.   Review of Systems: Please see the history of present illness.   Otherwise, the review of systems is positive for none.   All other systems are  reviewed and negative.   Physical Exam: VS:  There were no vitals taken for this visit. [name]  BMI There is no height or weight on file to calculate BMI.  Wt Readings from Last 3 Encounters:  11/24/20 69.9 kg  11/22/20 70 kg  09/29/20 69.7 kg   There were no vitals taken for this visit.   Affect appropriate Healthy:  appears stated age 68: post cervical fusion  Neck supple with no adenopathy JVP normal left bruits no thyromegaly Lungs clear with no wheezing and good diaphragmatic motion Heart:  S1/S2 no murmur, no rub, gallop or click PMI normal Abdomen: benighn, BS positve, no tenderness, no AAA no bruit.  No HSM or HJR Distal pulses intact with no bruits No edema Neuro gait instability RLE weakness  Skin warm and dry No muscular weakness Post bilateral TKR     LABORATORY DATA:  EKG:  12/02/19  SR rate 78  RBBB/LAFB PaC;s   Lab Results  Component Value Date   WBC 7.1 11/22/2020   HGB 13.0 11/22/2020   HCT 40.5 11/22/2020   PLT 306 11/22/2020   GLUCOSE 106 (H) 11/22/2020   CHOL 106 06/15/2020   TRIG 98 06/15/2020   HDL 38 (L) 06/15/2020   LDLCALC 50 06/15/2020   ALT 22 09/29/2020   AST 26 09/29/2020   NA 143 11/22/2020   K 4.1 11/22/2020   CL 106 11/22/2020   CREATININE 1.26 (H) 11/22/2020   BUN 16 11/22/2020   CO2 30 11/22/2020   TSH 1.01 06/15/2020   PSA 0.29 04/22/2018   INR 1.06 12/11/2012   HGBA1C 5.3 09/29/2020    BNP (last 3 results) No results for input(s): BNP in the last 8760 hours.  Other Studies Reviewed Today:  Echo 02/10/16 Myovue 01/25/16  Carotid 04/25/17   Assessment/Plan:  1. HTN -  See below on hydralazine and Hyzaar with bump in Cr to 2.3 latter stopped and norvasc started. Cr 1.26 improved 11/22/20   2. HLD - on statin therapy.   3. Carotid disease -  Duplex 12/09/19 plaque no stenosis f/u 2 years   4. COPD with stable dyspnea.   5. CKD - improved off Hyzaar see above   6. Neuro:  myesthenia w/u pending vision better f/u  [place] for 6th nerve palsy  7. 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 OSA: f/u [place] wearing CPAP  10. Spinal Stenosis :  Post decompressive cercvical laminectomy C3-5  Dr Saintclair Halsted 11/24/20 Still with RLE weakness and gait issues continue PT     Current medicines are reviewed with the patient today.  The patient does not have concerns regarding medicines other than what has been noted above.  The following changes have been made:  See above.  See above regarding changing BP meds    Disposition:    F/U 6 months   F.u with me in a year    Jenkins Rouge

## 2020-12-01 DIAGNOSIS — R441 Visual hallucinations: Secondary | ICD-10-CM

## 2020-12-01 LAB — BASIC METABOLIC PANEL
Anion gap: 9 (ref 5–15)
BUN: 29 mg/dL — ABNORMAL HIGH (ref 8–23)
CO2: 26 mmol/L (ref 22–32)
Calcium: 8.9 mg/dL (ref 8.9–10.3)
Chloride: 100 mmol/L (ref 98–111)
Creatinine, Ser: 1.61 mg/dL — ABNORMAL HIGH (ref 0.61–1.24)
GFR, Estimated: 43 mL/min — ABNORMAL LOW (ref >60)
Glucose, Bld: 111 mg/dL — ABNORMAL HIGH (ref 70–99)
Potassium: 3.8 mmol/L (ref 3.5–5.1)
Sodium: 135 mmol/L (ref 135–145)

## 2020-12-01 LAB — GLUCOSE, CAPILLARY
Glucose-Capillary: 126 mg/dL — ABNORMAL HIGH (ref 70–99)
Glucose-Capillary: 160 mg/dL — ABNORMAL HIGH (ref 70–99)
Glucose-Capillary: 129 mg/dL — ABNORMAL HIGH (ref 70–99)
Glucose-Capillary: 147 mg/dL — ABNORMAL HIGH (ref 70–99)

## 2020-12-01 MED ORDER — TRAMADOL HCL 50 MG PO TABS: 50.0000 mg | ORAL_TABLET | Freq: Four times a day (QID) | ORAL | Status: DC | PRN

## 2020-12-01 MED ORDER — TAMSULOSIN HCL 0.4 MG PO CAPS
0.4000 mg | ORAL_CAPSULE | Freq: Every day | ORAL | Status: DC
  Administered 2020-12-01 – 2020-12-03 (×3): 0.4 mg via ORAL
  Filled 2020-12-01 (×3): qty 1

## 2020-12-01 NOTE — Progress Notes (Signed)
Occupational Therapy Treatment Patient Details Name: [name] Pratt MRN: [id number] DOB: August 23, 1941 Today's Date: 12/01/2020    History of present illness Pt is an 80 y/o male presenting s/p C3-C5 posterior cervical fusion on 11/24/20. PMHx significant for ACDF C3-4 on 07/09/20, skin cancer, COPD, HTN, bilateral TKA and THA.   OT comments  Patient supine in bed and agreeable to OT session.  Requires min assist for bed mobility, setup for grooming at EOB (washing hands and face), min assist to change gown.  Sit to stand with cueing for hand placement, posture and safety with min assist from elevated EOB and mod assist from [facility].  In standing several losses of balance requiring up to mod assist with mobility from [facility] to recliner. Requires encouragement to optimize participation and engagement, remains limited by pain during session. Able to recall precautions but requires cueing to adhere functionally.  Reports frustration with self feeding (due to decreased coordination of UEs), but able to open and feed self prior to session without assistance. Pt seated in recliner with all needs met, RN present and chair alarm on upon therapist exit.    Follow Up Recommendations  SNF;Supervision/Assistance - 24 hour    Equipment Recommendations  None recommended by OT    Recommendations for Other Services      Precautions / Restrictions Precautions Precautions: Cervical Precaution Booklet Issued: Yes (comment) Precaution Comments: requires cues and repeated cueing for positioning with mobility Required Braces or Orthoses:  (no brace needed) Restrictions Weight Bearing Restrictions: No       Mobility Bed Mobility Overal bed mobility: Needs Assistance Bed Mobility: Rolling;Sit to Sidelying Rolling: Min assist Sidelying to sit: Min guard;HOB elevated       General bed mobility comments: min assist to roll towards L side, but once positioned able to transition to sitting with increased time and  only min guard    Transfers Overall transfer level: Needs assistance Equipment used: Rolling walker (2 wheeled) Transfers: Sit to/from Stand Sit to Stand: Min assist;Mod assist         General transfer comment: min assist from elevated EOB, mod assist from [facility] with assist to power up given cueing for hand placement and increased time to transition    Balance Overall balance assessment: Needs assistance Sitting-balance support: No upper extremity supported;Feet supported Sitting balance-Leahy Scale: Fair Sitting balance - Comments: min guard for safety/balance, progressing to close supervision; preference to BUE support   Standing balance support: Bilateral upper extremity supported;During functional activity Standing balance-Leahy Scale: Poor Standing balance comment: relies on BUE support and external support                           ADL either performed or assessed with clinical judgement   ADL Overall ADL's : Needs assistance/impaired     Grooming: Set up;Sitting;Wash/dry hands;Wash/dry face Grooming Details (indicate cue type and reason): increased effort         Upper Body Dressing : Minimal assistance;Sitting Upper Body Dressing Details (indicate cue type and reason): changed gown, minA     Toilet Transfer: Moderate assistance;Ambulation;BSC;RW Toilet Transfer Details (indicate cue type and reason): sit to stand from EOB with min assist but from [facility] with mod assist Toileting- Clothing Manipulation and Hygiene: Minimal assistance;Sit to/from stand Toileting - Clothing Manipulation Details (indicate cue type and reason): clothing mgmt     Functional mobility during ADLs: Minimal assistance;Moderate assistance;Rolling walker;Cueing for safety;Cueing for sequencing General ADL Comments: remains limited  by pain and decreased activity tolerance, balance     Vision       Perception     Praxis      Cognition Arousal/Alertness: Awake/alert Behavior  During Therapy: Anxious Overall Cognitive Status: Impaired/Different from baseline Area of Impairment: Awareness;Problem solving;Safety/judgement                         Safety/Judgement: Decreased awareness of safety;Decreased awareness of deficits Awareness: Emergent Problem Solving: Slow processing;Requires verbal cues General Comments: patient recalls neck precautions without cueing but requires cueing to functionally adhere to precautions, decreased safety awareness and problem solving with functional activities        Exercises     Shoulder Instructions       General Comments VSS    Pertinent Vitals/ Pain       Pain Assessment: Faces Faces Pain Scale: Hurts even more Pain Location: neck Pain Descriptors / Indicators: Aching;Discomfort;Operative site guarding;Shooting;Sharp Pain Intervention(s): Limited activity within patient's tolerance;Monitored during session;Repositioned;RN gave pain meds during session;Premedicated before session  Home Living                                          Prior Functioning/Environment              Frequency  Min 2X/week        Progress Toward Goals  OT Goals(current goals can now be found in the care plan section)  Progress towards OT goals: Progressing toward goals  Acute Rehab OT Goals Patient Stated Goal: to feel better, get to rehab OT Goal Formulation: With patient  Plan Discharge plan remains appropriate;Frequency remains appropriate    Co-evaluation                 AM-PAC OT "6 Clicks" Daily Activity     Outcome Measure   Help from another person eating meals?: A Little Help from another person taking care of personal grooming?: A Little Help from another person toileting, which includes using toliet, bedpan, or urinal?: A Lot Help from another person bathing (including washing, rinsing, drying)?: A Lot Help from another person to put on and taking off regular upper body  clothing?: A Little Help from another person to put on and taking off regular lower body clothing?: A Lot 6 Click Score: 15    End of Session Equipment Utilized During Treatment: Gait belt;Rolling walker  OT Visit Diagnosis: Unsteadiness on feet (R26.81);Pain Pain - part of body: Shoulder (neck)   Activity Tolerance Patient tolerated treatment well   Patient Left in chair;with call bell/phone within reach;with chair alarm set;with nursing/sitter in room   Nurse Communication Mobility status        Time: 1001-1018 OT Time Calculation (min): 17 min  Charges: OT General Charges $OT Visit: 1 Visit OT Treatments $Self Care/Home Management : 8-22 mins  Jolaine Artist, [place] Pager [phone] Office [phone]    Delight Stare 12/01/2020, 11:26 AM

## 2020-12-01 NOTE — Progress Notes (Signed)
Physical Therapy Treatment Patient Details Name: [name] Pratt MRN: [id number] DOB: 21-Aug-1941 Today's Date: 12/01/2020    History of Present Illness Pt is an 80 y/o male presenting s/p C3-C5 posterior cervical fusion on 11/24/20. PMHx significant for ACDF C3-4 on 07/09/20, skin cancer, COPD, HTN, bilateral TKA and THA.    PT Comments    Pt making gradual progress. Wife present and reports ambulation looks better.  He does still require min-mod A for transfers and had 5 LOB with gait requiring mod A to recover. Frequent cues for transfer techniques, RW safety, and neck precautions.  Continue plan of care.    Follow Up Recommendations  SNF;Supervision/Assistance - 24 hour     Equipment Recommendations  Rolling walker with 5" wheels    Recommendations for Other Services       Precautions / Restrictions Precautions Precautions: Cervical Precaution Booklet Issued: Yes (comment) (pt has in room) Precaution Comments: requires cues and repeated cueing for positioning with mobility Required Braces or Orthoses:  (no brace needed)    Mobility  Bed Mobility               General bed mobility comments: in chair    Transfers Overall transfer level: Needs assistance Equipment used: Rolling walker (2 wheeled) Transfers: Sit to/from Stand Sit to Stand: Min assist         General transfer comment: Cues for hand placement and min A to power up.  Performed x 3 for transfers, x 3 in a row for exercise, and x3 in row with L foot forward to bias R LE for strengthening. Initial stand pt with posterior lean requiring mod support to recover - cues to lean forward on subsequent stands and improved to min A  Ambulation/Gait Ambulation/Gait assistance: Min assist;Mod assist Gait Distance (Feet): 100 Feet (30' then 100') Assistive device: Rolling walker (2 wheeled) Gait Pattern/deviations: Step-through pattern;Trunk flexed;Narrow base of support;Decreased stance time - right Gait  velocity: Decreased   General Gait Details: Pt with tendency to flex trunk and let walker go too far forward.  Required frequent cues to stay close to RW and constant cues to stay inside RW with 6 turns.  Pt impulsive at times with increased forward momentum and when RW would get forward he would lose balance to L or posteriorly requiring mod A to recover - occured at least 5 times.  Additionally , very narrow BOS with cues to increase as able.   Stairs             Wheelchair Mobility    Modified Rankin (Stroke Patients Only)       Balance Overall balance assessment: Needs assistance Sitting-balance support: No upper extremity supported;Feet supported Sitting balance-Leahy Scale: Fair Sitting balance - Comments: requiring close supervision   Standing balance support: Bilateral upper extremity supported;During functional activity Standing balance-Leahy Scale: Poor                              Cognition Arousal/Alertness: Awake/alert Behavior During Therapy: Anxious Overall Cognitive Status: Impaired/Different from baseline Area of Impairment: Awareness;Problem solving;Safety/judgement                     Memory: Decreased short-term memory;Decreased recall of precautions   Safety/Judgement: Decreased awareness of safety;Decreased awareness of deficits   Problem Solving: Requires verbal cues General Comments: Patient recalls neck precautions without cueing but requires cueing to functionally adhere to precautions, decreased safety awareness and  problem solving with functional activities      Exercises      General Comments General comments (skin integrity, edema, etc.): VSS      Pertinent Vitals/Pain Pain Assessment: 0-10 Pain Score: 7  Pain Location: neck Pain Descriptors / Indicators: Aching;Discomfort;Operative site guarding;Shooting;Sharp Pain Intervention(s): Limited activity within patient's tolerance;Monitored during  session;Repositioned;Premedicated before session;Relaxation;Other (comment) (cues for precautions)    Home Living                      Prior Function            PT Goals (current goals can now be found in the care plan section) Acute Rehab PT Goals Patient Stated Goal: to feel better, get to rehab PT Goal Formulation: With patient Time For Goal Achievement: 12/09/20 Potential to Achieve Goals: Good Progress towards PT goals: Progressing toward goals    Frequency    Min 5X/week      PT Plan Current plan remains appropriate    Co-evaluation              AM-PAC PT "6 Clicks" Mobility   Outcome Measure  Help needed turning from your back to your side while in a flat bed without using bedrails?: A Little Help needed moving from lying on your back to sitting on the side of a flat bed without using bedrails?: A Lot Help needed moving to and from a bed to a chair (including a wheelchair)?: A Little Help needed standing up from a chair using your arms (e.g., wheelchair or bedside chair)?: A Lot Help needed to walk in hospital room?: A Lot Help needed climbing 3-5 steps with a railing? : A Lot 6 Click Score: 14    End of Session Equipment Utilized During Treatment: Gait belt Activity Tolerance: Patient tolerated treatment well Patient left: with call bell/phone within reach;in chair;with chair alarm set;with family/visitor present Nurse Communication: Mobility status PT Visit Diagnosis: Unsteadiness on feet (R26.81);Other abnormalities of gait and mobility (R26.89);Muscle weakness (generalized) (M62.81)     Time: 8016-5537 PT Time Calculation (min) (ACUTE ONLY): 24 min  Charges:  $Gait Training: 8-22 mins $Therapeutic Activity: 8-22 mins                     Abran Richard, PT Acute Rehab Services Pager [phone] [facility] [place] 12/01/2020, 4:59 PM

## 2020-12-01 NOTE — Progress Notes (Signed)
Subjective: Patient reports he does recognize his confusion. Otherwise only a mild amount of pain in his neck  Objective: Vital signs in last 24 hours: Temp:  [97.6 F (36.4 C)-98.7 F (37.1 C)] 97.6 F (36.4 C) (03/23 1202) Pulse Rate:  [67-73] 67 (03/23 1202) Resp:  [18-20] 18 (03/23 0804) BP: (92-165)/(53-92) 92/53 (03/23 1202) SpO2:  [97 %-99 %] 97 % (03/23 1202)  Intake/Output from previous day: 03/22 0701 - 03/23 0700 In: 50 [P.O.:50] Out: 1925 [Urine:1925] Intake/Output this shift: No intake/output data recorded.  Neurologic: Grossly normal  Lab Results: Lab Results  Component Value Date   WBC 9.1 11/30/2020   HGB 12.6 (L) 11/30/2020   HCT 37.1 (L) 11/30/2020   MCV 95.4 11/30/2020   PLT 385 11/30/2020   Lab Results  Component Value Date   INR 1.06 12/11/2012   BMET Lab Results  Component Value Date   NA 135 11/30/2020   K 3.1 (L) 11/30/2020   CL 98 11/30/2020   CO2 28 11/30/2020   GLUCOSE 129 (H) 11/30/2020   BUN 23 11/30/2020   CREATININE 1.22 11/30/2020   CALCIUM 8.9 11/30/2020    Studies/Results: No results found.  Assessment/Plan: 80 year old postop day 7 posterior cervical fusion. Hospitalists have confirmed that they think his delirium is coming from hospitalization and pain medications. They have not found any other underlying source of confusion. Wrote for dc foley this morning and placed him on flomax. Will dc narcotics.    LOS: 7 days    Ocie Cornfield [facility] 12/01/2020, 3:50 PM

## 2020-12-01 NOTE — Progress Notes (Signed)
PROGRESS NOTE    [name] Pratt  NIO:[id number] DOB: 31-Mar-1941 DOA: 11/24/2020 PCP: [name], NP   No chief complaint on file.   Brief Narrative:   This is a 80 y.o. male with a past medical history of carotid stenosis, COPD, OSA, hypertension, hyperlipidemia, cervical spondylitic myelopathy who was admitted on 11/24/20 by [name], neurosurgery, for numbness and tingling in his hands and difficulty walking. Patient underwent anterior cervical discectomy and fusion to decompress the anterior part of the spinal cord though neurosurgery noted he still had some posterior compression on follow up imaging and so posterior cervical decompression with lateral mass fixation was recommended. Patient underwent decompressive cervical laminectomy C3-4 and C4-5, posterior lateral fusion with lateral mass screw placement at C3, C4 and C5 on the right and C3 and C5 on the left and posterior lateral arthrodesis C3-4 and C4-5 on 11/24/20 with [name]. Since that time, the patient had urinary retention noted on 3/17 requiring foley. He has also had improvement in his presenting symptoms but has been requiring opiates for pain management. Plan was made for patient to be discharged to SNF when able as his wife is unlikely able to handle his care at home. He was noted to be hallucinating on 3/21 which prompted internal medicine consult.   Patient seen and examined today he is alert and answering questions appropriately.  He denying any more hallucinations at this time. Assessment & Plan:   Active Problems:   Myelopathy (HCC)   Visual hallucinations  Visual hallucinations of unclear etiology No hallucinations.  Minimize sedating medications.    Urinary retention:  Continue with foley.     Hypertension:  Borderline BP parameters. Will hold amlodipine.     Hypothyroidism:  Resume synthroid.    COPD  Not in exacerbation.      DVT prophylaxis: SCDs Code Status: Full code Family  Communication: None at bedside Disposition:   Status is: Inpatient  Remains inpatient appropriate because:Unsafe d/c plan       Level of care: Med-Surg  Antimicrobials: None  Subjective: No new complaints  Objective: Vitals:   11/30/20 2323 12/01/20 0404 12/01/20 0804 12/01/20 1202  BP: (!) 165/83 (!) 133/92 123/81 (!) 92/53  Pulse: 73 72 67 67  Resp: 18 20 18    Temp: 98.1 F (36.7 C) 98.7 F (37.1 C) 97.9 F (36.6 C) 97.6 F (36.4 C)  TempSrc: Oral  Oral Oral  SpO2: 97% 98% 99% 97%  Weight:      Height:        Intake/Output Summary (Last 24 hours) at 12/01/2020 1527 Last data filed at 11/30/2020 1857 Gross per 24 hour  Intake --  Output 600 ml  Net -600 ml   Filed Weights   11/24/20 1152  Weight: 69.9 kg    Examination:  General exam: Appears calm and comfortable  Respiratory system: Clear to auscultation. Respiratory effort normal. Cardiovascular system: S1 & S2 heard, RRR. No JVD,No pedal edema. Gastrointestinal system: Abdomen is nondistended, soft and nontender.Normal bowel sounds heard. Central nervous system: Alert and oriented. No focal neurological deficits. Extremities: Symmetric 5 x 5 power. Skin: No rashes, lesions or ulcers  Psychiatry:. Mood & affect appropriate.     Data Reviewed: I have personally reviewed following labs and imaging studies  CBC: Recent Labs  Lab 11/30/20 1604  WBC 9.1  HGB 12.6*  HCT 37.1*  MCV 95.4  PLT 818    Basic Metabolic Panel: Recent Labs  Lab 11/30/20 1604  NA 135  K 3.1*  CL 98  CO2 28  GLUCOSE 129*  BUN 23  CREATININE 1.22  CALCIUM 8.9  MG 1.9    GFR: Estimated Creatinine Clearance: 43.6 mL/min (by C-G formula based on SCr of 1.22 mg/dL).  Liver Function Tests: Recent Labs  Lab 11/30/20 1604  AST 26  ALT 15  ALKPHOS 77  BILITOT 0.9  PROT 6.8  ALBUMIN 3.2*    CBG: Recent Labs  Lab 11/30/20 0629 11/30/20 1123 11/30/20 1603 11/30/20 2117 12/01/20 0622  GLUCAP 150* 122*  142* 169* 126*     Recent Results (from the past 240 hour(s))  Surgical pcr screen     Status: None   Collection Time: 11/22/20  1:30 PM   Specimen: Nasal Mucosa; Nasal Swab  Result Value Ref Range Status   MRSA, PCR NEGATIVE NEGATIVE Final   Staphylococcus aureus NEGATIVE NEGATIVE Final    Comment: (NOTE) The Xpert SA Assay (FDA approved for NASAL specimens in patients 32 years of age and older), is one component of a comprehensive surveillance program. It is not intended to diagnose infection nor to guide or monitor treatment. Performed at [facility], [place] [address], [place], [place] [address]   SARS CORONAVIRUS 2 (TAT 6-24 HRS) Nasopharyngeal Nasopharyngeal Swab     Status: None   Collection Time: 11/22/20  1:30 PM   Specimen: Nasopharyngeal Swab  Result Value Ref Range Status   SARS Coronavirus 2 NEGATIVE NEGATIVE Final    Comment: (NOTE) SARS-CoV-2 target nucleic acids are NOT DETECTED.  The SARS-CoV-2 RNA is generally detectable in upper and lower respiratory specimens during the acute phase of infection. Negative results do not preclude SARS-CoV-2 infection, do not rule out co-infections with other pathogens, and should not be used as the sole basis for treatment or other patient management decisions. Negative results must be combined with clinical observations, patient history, and epidemiological information. The expected result is Negative.  Fact Sheet for Patients: SugarRoll.be  Fact Sheet for Healthcare Providers: https://www.woods-mathews.com/  This test is not yet approved or cleared by the [place] FDA and  has been authorized for detection and/or diagnosis of SARS-CoV-2 by FDA under an Emergency Use Authorization (EUA). This EUA will remain  in effect (meaning this test can be used) for the duration of the COVID-19 declaration under Se ction 564(b)(1) of the Act, 21 U.S.C. section 360bbb-3(b)(1),  unless the authorization is terminated or revoked sooner.  Performed at [facility], [place] [address], [place], [place] [address]          Radiology Studies: No results found.      Scheduled Meds:  acetaminophen  650 mg Oral TID   allopurinol  100 mg Oral BID   amLODipine  5 mg Oral Daily   aspirin EC  81 mg Oral q morning   Chlorhexidine Gluconate Cloth  6 each Topical Daily   cholecalciferol  2,000 Units Oral Daily   cycloSPORINE  1 drop Both Eyes BID   gabapentin  300 mg Oral QHS   hydrALAZINE  50 mg Oral BID   insulin aspart  0-15 Units Subcutaneous TID WC   levothyroxine  150 mcg Oral Once per day on Mon Tue Wed Thu Fri Sat   levothyroxine  75 mcg Oral Q Sun   losartan  100 mg Oral QHS   multivitamin with minerals  1 tablet Oral Daily   pantoprazole  40 mg Oral QHS   rosuvastatin  5 mg Oral Daily  sodium chloride flush  3 mL Intravenous Q12H   tamsulosin  0.4 mg Oral Daily   Continuous Infusions:  sodium chloride       LOS: 7 days        Hosie Poisson, MD [facility]   To contact the attending provider between 7A-7P or the covering provider during after hours 7P-7A, please log into the web site [email or web address] and access using universal [place] password for that web site. If you do not have the password, please call the hospital operator.  12/01/2020, 3:27 PM

## 2020-12-01 NOTE — Progress Notes (Signed)
Patient has his home unit at bedside.  Patient stated he can manage it and needs nothing at this time.  No distress noted.

## 2020-12-02 DIAGNOSIS — R441 Visual hallucinations: Secondary | ICD-10-CM | POA: Diagnosis not present

## 2020-12-02 LAB — GLUCOSE, CAPILLARY
Glucose-Capillary: 126 mg/dL — ABNORMAL HIGH (ref 70–99)
Glucose-Capillary: 164 mg/dL — ABNORMAL HIGH (ref 70–99)
Glucose-Capillary: 164 mg/dL — ABNORMAL HIGH (ref 70–99)
Glucose-Capillary: 87 mg/dL (ref 70–99)
Glucose-Capillary: 173 mg/dL — ABNORMAL HIGH (ref 70–99)
Glucose-Capillary: 122 mg/dL — ABNORMAL HIGH (ref 70–99)

## 2020-12-02 LAB — SARS CORONAVIRUS 2 (TAT 6-24 HRS): SARS Coronavirus 2: NEGATIVE

## 2020-12-02 NOTE — TOC Progression Note (Signed)
Transition of Care [facility]) - Progression Note    Patient Details  Name: [name] Pratt MRN: [id number] Date of Birth: February 27, 1941  Transition of Care [facility]) CM/SW [place], [place] Phone Number: 12/02/2020, 3:42 PM  Clinical Narrative:   CSW reached out to [facility] this morning to check on status of insurance authorization, and [facility] again confirmed that they do not manage the patient, he is managed through [facility]. CSW updated [employer], and they still have not heard back from [place] on insurance authorization. CSW spoke with patient's wife via phone about delay in discharge due to insurance authorization, and wife is frustrated because she wants him to get out of the hospital to see improvements in his delirium. CSW reassured the wife that we are doing everything that we can to get him out of the hospital sooner rather than later, but [place]arthur Rossetti is the barrier. Wife called back later that she had called [employer], and they didn't have a request on file. CSW confirmed that the number that the wife called is the number that we have been using to reach [facility], and that we will update as soon as we hear anything back. CSW notified by Rober Minion that they finally got someone at [facility] and they are still reviewing the request, say that they will have an answer by the end of the day today. [place] ready to take the patient first thing tomorrow morning, if authorization is provided by [employer] CSW to follow.    Expected Discharge Plan: [place] Barriers to Discharge: Continued Medical Work up,Insurance Authorization  Expected Discharge Plan and Services Expected Discharge Plan: [place]   Discharge Planning Services: CM Consult Post Acute Care Choice: [place] Living arrangements for the past 2 months: [place]: PT,OT [place] Agency: [place] Date [place]: 11/25/20   Representative spoke with at [place]: [place]) Interventions    Readmission Risk Interventions No flowsheet data found.

## 2020-12-02 NOTE — Progress Notes (Signed)
Subjective: Patient reports not much neck pain.   Objective: Vital signs in last 24 hours: Temp:  [97.6 F (36.4 C)-98.3 F (36.8 C)] 98.2 F (36.8 C) (03/24 0818) Pulse Rate:  [58-72] 72 (03/24 0818) Resp:  [16-18] 18 (03/24 0818) BP: (92-146)/(53-87) 117/65 (03/24 0818) SpO2:  [95 %-98 %] 96 % (03/24 0818)  Intake/Output from previous day: 03/23 0701 - 03/24 0700 In: 240 [P.O.:240] Out: 1420 [Urine:1420] Intake/Output this shift: No intake/output data recorded.  Neurologic: Grossly normal  Lab Results: Lab Results  Component Value Date   WBC 9.1 11/30/2020   HGB 12.6 (L) 11/30/2020   HCT 37.1 (L) 11/30/2020   MCV 95.4 11/30/2020   PLT 385 11/30/2020   Lab Results  Component Value Date   INR 1.06 12/11/2012   BMET Lab Results  Component Value Date   NA 135 12/01/2020   K 3.8 12/01/2020   CL 100 12/01/2020   CO2 26 12/01/2020   GLUCOSE 111 (H) 12/01/2020   BUN 29 (H) 12/01/2020   CREATININE 1.61 (H) 12/01/2020   CALCIUM 8.9 12/01/2020    Studies/Results: No results found.  Assessment/Plan: Postop day 8 posterior cervical fusion. Patient seems to be better today from a delirium standpoint. Waiting for bed placement at rehab. Ok to discharge when bed available.    LOS: 8 days    Ocie Cornfield [facility] 12/02/2020, 8:44 AM

## 2020-12-02 NOTE — TOC Progression Note (Signed)
Transition of Care [facility]) - Progression Note    Patient Details  Name: [name] Pratt MRN: [id number] Date of Birth: 1941-03-09  Transition of Care [facility]) CM/SW [place], [place] Phone Number: 12/02/2020, 3:41 PM  Clinical Narrative:   CSW received call from [facility] that [facility] is saying that they don't manage the patient, that he is managed through [facility]. CSW had previously contacted [facility] and they said they didn't manage the patient, but CSW faxed in insurance authorization request to [facility] regardless. CSW to follow.     Expected Discharge Plan: [place] Barriers to Discharge: Continued Medical Work up,Insurance Authorization  Expected Discharge Plan and Services Expected Discharge Plan: [place]   Discharge Planning Services: CM Consult Post Acute Care Choice: [place] Living arrangements for the past 2 months: [place]: PT,OT [place] Agency: [place] Date [place]: 11/25/20   Representative spoke with at [place]: [place]) Interventions    Readmission Risk Interventions No flowsheet data found.

## 2020-12-02 NOTE — Progress Notes (Signed)
Physical Therapy Treatment Patient Details Name: [name] Pratt MRN: [id number] DOB: 02-Mar-1941 Today's Date: 12/02/2020    History of Present Illness Pt is an 80 y/o male presenting s/p C3-C5 posterior cervical fusion on 11/24/20. PMHx significant for ACDF C3-4 on 07/09/20, skin cancer, COPD, HTN, bilateral TKA and THA.    PT Comments    Pt with improved bed mobility and transfers during session. Pt more agreeable to get up with therapy. Pt continues to have pain limiting endurance but demonstrates improvements compared to previous sessions. Pt will continue to benefit from skilled PT to address deficits in balance, strength, coordination, gait, endurance and safety to maximize independence with functional mobility prior to discharge.     Follow Up Recommendations  SNF;Supervision/Assistance - 24 hour     Equipment Recommendations  Rolling walker with 5" wheels    Recommendations for Other Services       Precautions / Restrictions Precautions Precautions: Cervical Precaution Booklet Issued: Yes (comment) Precaution Comments: requiring cueing to maintain during bed mobility and ambulation    Mobility  Bed Mobility Overal bed mobility: Needs Assistance Bed Mobility: Supine to Sit     Supine to sit: Min assist;HOB elevated     General bed mobility comments: min A needed to maintain precautions    Transfers Overall transfer level: Needs assistance Equipment used: Rolling walker (2 wheeled) Transfers: Sit to/from Stand Sit to Stand: Min assist         General transfer comment: Cues for hand placement and min A to power up, increased posterior lean  Ambulation/Gait Ambulation/Gait assistance: Min assist;Mod assist Gait Distance (Feet): 120 Feet Assistive device: Rolling walker (2 wheeled) Gait Pattern/deviations: Step-through pattern;Trunk flexed;Narrow base of support;Decreased stance time - right     General Gait Details: Pt with tendency to flex trunk and  let walker go too far forward.  Required frequent cues to stay close to RW.  Pt impulsive wtih turning requiring mod A to maintain balance. Pt became fatigued requiring increased assist to maintain upright posture.  Additionally , very narrow BOS with cues to increase as able.   Stairs             Wheelchair Mobility    Modified Rankin (Stroke Patients Only)       Arboriculturist Exercises - Lower Extremity Long Arc Quad: AROM;Both;10 reps;Seated Hip Flexion/Marching: AROM;Both;10 reps;Seated    General Comments        Pertinent Vitals/Pain Pain Score: 10-Worst pain ever Pain Location: neck; pt stated was a 6/10 mostly but increased to 10 during ambulation    Home Living                      Prior Function            PT Goals (current goals can now be found in the care plan section) Acute Rehab PT Goals Patient Stated Goal: to feel better, get to rehab PT Goal Formulation: With patient  Time For Goal Achievement: 12/09/20 Potential to Achieve Goals: Good Progress towards PT goals: Progressing toward goals    Frequency    Min 5X/week      PT Plan Current plan remains appropriate    Co-evaluation              AM-PAC PT "6 Clicks" Mobility   Outcome Measure  Help needed turning from your back to your side while in a flat bed without using bedrails?: A Little Help needed moving from lying on your back to sitting on the side of a flat bed without using bedrails?: A Little Help needed moving to and from a bed to a chair (including a wheelchair)?: A Little Help needed standing up from a chair using your arms (e.g., wheelchair or bedside chair)?: A Little Help needed to walk in hospital room?: A Little Help needed climbing 3-5 steps with a railing? : A Lot 6 Click Score: 17    End of Session  Equipment Utilized During Treatment: Gait belt Activity Tolerance: Patient tolerated treatment well Patient left: in chair;with call bell/phone within reach;with chair alarm set Nurse Communication: Mobility status PT Visit Diagnosis: Unsteadiness on feet (R26.81);Other abnormalities of gait and mobility (R26.89);Muscle weakness (generalized) (M62.81)     Time: 4462-8638 PT Time Calculation (min) (ACUTE ONLY): 13 min  Charges:  $Therapeutic Exercise: 8-22 mins                     Lyanne Co, DPT Acute Rehabilitation Services [phone]   [place] 12/02/2020, 12:19 PM

## 2020-12-02 NOTE — Progress Notes (Signed)
PROGRESS NOTE    [name] Pratt  DDU:[id number] DOB: [birth year] DOA: 11/24/2020 PCP: [name], NP   No chief complaint on file.   Brief Narrative:   This is a 80 y.o. male with a past medical history of carotid stenosis, COPD, OSA, hypertension, hyperlipidemia, cervical spondylitic myelopathy who was admitted on 11/24/20 by [name], neurosurgery, for numbness and tingling in his hands and difficulty walking. Patient underwent anterior cervical discectomy and fusion to decompress the anterior part of the spinal cord though neurosurgery noted he still had some posterior compression on follow up imaging and so posterior cervical decompression with lateral mass fixation was recommended. Patient underwent decompressive cervical laminectomy C3-4 and C4-5, posterior lateral fusion with lateral mass screw placement at C3, C4 and C5 on the right and C3 and C5 on the left and posterior lateral arthrodesis C3-4 and C4-5 on 11/24/20 with [name]. Since that time, the patient had urinary retention noted on 3/17 requiring foley. He has also had improvement in his presenting symptoms but has been requiring opiates for pain management. Plan was made for patient to be discharged to SNF when able as his wife is unlikely able to handle his care at home. He was noted to be hallucinating on 3/21 which prompted internal medicine consult.  Pt seen and examined at bedside, more alert today , no new complaints.    Assessment & Plan:   Active Problems:   Myelopathy (HCC)   Visual hallucinations  Visual hallucinations of unclear etiology No hallucinations.  Minimize sedating medications.    Urinary retention:  Continue with foley.     Hypertension:  Well controlled.     Hypothyroidism:  Resume synthroid.    COPD  Not in exacerbation.   Mild AKi; Encourage oral intake.  Hold nephrotoxins, and repeat BMP in am.    Hypokalemia  Replaced.   DVT prophylaxis: SCDs Code Status: Full  code Family Communication: None at bedside Disposition:   Status is: Inpatient  Remains inpatient appropriate because:Unsafe d/c plan       Level of care: Med-Surg  Antimicrobials: None  Subjective: No chest pain or sob.   Objective: Vitals:   12/02/20 0321 12/02/20 0744 12/02/20 0818 12/02/20 1137  BP: 126/73 120/74 117/65 (!) 111/59  Pulse: 65 66 72 64  Resp:  16 18 20   Temp: 98.1 F (36.7 C) 98.2 F (36.8 C) 98.2 F (36.8 C) 97.9 F (36.6 C)  TempSrc: Oral Oral Oral Oral  SpO2: 95% 96% 96% 99%  Weight:      Height:        Intake/Output Summary (Last 24 hours) at 12/02/2020 1431 Last data filed at 12/02/2020 0930 Gross per 24 hour  Intake 743 ml  Output 1420 ml  Net -677 ml   Filed Weights   11/24/20 1152  Weight: 69.9 kg    Examination:  General exam: alert and comfortable.  Respiratory system: clear to auscultation, no wheezing heard.  Cardiovascular system: S1 & S2 heard, RRR, no JVD  Gastrointestinal system: Abdomen is soft, non tender non distended, bowel sounds wnl. Central nervous system: alert and oriented, non focal  Extremities: no cyanosis.  Skin: no rashes.  Psychiatry:. Mood is appropriate.     Data Reviewed: I have personally reviewed following labs and imaging studies  CBC: Recent Labs  Lab 11/30/20 1604  WBC 9.1  HGB 12.6*  HCT 37.1*  MCV 95.4  PLT 237    Basic Metabolic Panel: Recent Labs  Lab 11/30/20 1604  12/01/20 1601  NA 135 135  K 3.1* 3.8  CL 98 100  CO2 28 26  GLUCOSE 129* 111*  BUN 23 29*  CREATININE 1.22 1.61*  CALCIUM 8.9 8.9  MG 1.9  --     GFR: Estimated Creatinine Clearance: 33 mL/min (A) (by C-G formula based on SCr of 1.61 mg/dL (H)).  Liver Function Tests: Recent Labs  Lab 11/30/20 1604  AST 26  ALT 15  ALKPHOS 77  BILITOT 0.9  PROT 6.8  ALBUMIN 3.2*    CBG: Recent Labs  Lab 12/01/20 1617 12/01/20 2128 12/02/20 0612 12/02/20 0822 12/02/20 1140  GLUCAP 129* 147* 126* 164*   164* 87     Recent Results (from the past 240 hour(s))  SARS CORONAVIRUS 2 (TAT 6-24 HRS) Nasopharyngeal Nasopharyngeal Swab     Status: None   Collection Time: 12/02/20  4:15 AM   Specimen: Nasopharyngeal Swab  Result Value Ref Range Status   SARS Coronavirus 2 NEGATIVE NEGATIVE Final    Comment: (NOTE) SARS-CoV-2 target nucleic acids are NOT DETECTED.  The SARS-CoV-2 RNA is generally detectable in upper and lower respiratory specimens during the acute phase of infection. Negative results do not preclude SARS-CoV-2 infection, do not rule out co-infections with other pathogens, and should not be used as the sole basis for treatment or other patient management decisions. Negative results must be combined with clinical observations, patient history, and epidemiological information. The expected result is Negative.  Fact Sheet for Patients: SugarRoll.be  Fact Sheet for Healthcare Providers: https://www.woods-mathews.com/  This test is not yet approved or cleared by the [place] FDA and  has been authorized for detection and/or diagnosis of SARS-CoV-2 by FDA under an Emergency Use Authorization (EUA). This EUA will remain  in effect (meaning this test can be used) for the duration of the COVID-19 declaration under Se ction 564(b)(1) of the Act, 21 U.S.C. section 360bbb-3(b)(1), unless the authorization is terminated or revoked sooner.  Performed at [facility], [place] [address] [place], [place] [address]          Radiology Studies: No results found.      Scheduled Meds: . acetaminophen  650 mg Oral TID  . allopurinol  100 mg Oral BID  . aspirin EC  81 mg Oral q morning  . Chlorhexidine Gluconate Cloth  6 each Topical Daily  . cholecalciferol  2,000 Units Oral Daily  . cycloSPORINE  1 drop Both Eyes BID  . gabapentin  300 mg Oral QHS  . hydrALAZINE  50 mg Oral BID  . insulin aspart  0-15 Units Subcutaneous TID  WC  . levothyroxine  150 mcg Oral Once per day on Mon Tue Wed Thu Fri Sat  . levothyroxine  75 mcg Oral Q Sun  . losartan  100 mg Oral QHS  . multivitamin with minerals  1 tablet Oral Daily  . pantoprazole  40 mg Oral QHS  . rosuvastatin  5 mg Oral Daily  . sodium chloride flush  3 mL Intravenous Q12H  . tamsulosin  0.4 mg Oral Daily   Continuous Infusions: . sodium chloride       LOS: 8 days        Hosie Poisson, MD [facility]   To contact the attending provider between 7A-7P or the covering provider during after hours 7P-7A, please log into the web site [email or web address] and access using universal [place] password for that web site. If you do not have the password, please call the  hospital operator.  12/02/2020, 2:31 PM

## 2020-12-03 DIAGNOSIS — I700 Atherosclerosis of aorta: Secondary | ICD-10-CM | POA: Diagnosis not present

## 2020-12-03 DIAGNOSIS — Z743 Need for continuous supervision: Secondary | ICD-10-CM | POA: Diagnosis not present

## 2020-12-03 DIAGNOSIS — M4712 Other spondylosis with myelopathy, cervical region: Secondary | ICD-10-CM | POA: Diagnosis not present

## 2020-12-03 DIAGNOSIS — R1312 Dysphagia, oropharyngeal phase: Secondary | ICD-10-CM | POA: Diagnosis not present

## 2020-12-03 DIAGNOSIS — I6523 Occlusion and stenosis of bilateral carotid arteries: Secondary | ICD-10-CM | POA: Diagnosis not present

## 2020-12-03 DIAGNOSIS — G959 Disease of spinal cord, unspecified: Secondary | ICD-10-CM | POA: Diagnosis not present

## 2020-12-03 DIAGNOSIS — R41841 Cognitive communication deficit: Secondary | ICD-10-CM | POA: Diagnosis not present

## 2020-12-03 DIAGNOSIS — G4733 Obstructive sleep apnea (adult) (pediatric): Secondary | ICD-10-CM | POA: Diagnosis not present

## 2020-12-03 DIAGNOSIS — Z981 Arthrodesis status: Secondary | ICD-10-CM

## 2020-12-03 DIAGNOSIS — M6281 Muscle weakness (generalized): Secondary | ICD-10-CM | POA: Diagnosis not present

## 2020-12-03 DIAGNOSIS — R441 Visual hallucinations: Secondary | ICD-10-CM | POA: Diagnosis not present

## 2020-12-03 DIAGNOSIS — Z9989 Dependence on other enabling machines and devices: Secondary | ICD-10-CM | POA: Diagnosis not present

## 2020-12-03 DIAGNOSIS — R5381 Other malaise: Secondary | ICD-10-CM | POA: Diagnosis not present

## 2020-12-03 DIAGNOSIS — M4800 Spinal stenosis, site unspecified: Secondary | ICD-10-CM | POA: Diagnosis not present

## 2020-12-03 DIAGNOSIS — M4710 Other spondylosis with myelopathy, site unspecified: Secondary | ICD-10-CM | POA: Diagnosis not present

## 2020-12-03 DIAGNOSIS — R279 Unspecified lack of coordination: Secondary | ICD-10-CM | POA: Diagnosis not present

## 2020-12-03 DIAGNOSIS — E785 Hyperlipidemia, unspecified: Secondary | ICD-10-CM | POA: Diagnosis not present

## 2020-12-03 DIAGNOSIS — M4802 Spinal stenosis, cervical region: Secondary | ICD-10-CM | POA: Diagnosis not present

## 2020-12-03 DIAGNOSIS — M4322 Fusion of spine, cervical region: Secondary | ICD-10-CM | POA: Diagnosis not present

## 2020-12-03 DIAGNOSIS — E7800 Pure hypercholesterolemia, unspecified: Secondary | ICD-10-CM | POA: Diagnosis not present

## 2020-12-03 DIAGNOSIS — R7989 Other specified abnormal findings of blood chemistry: Secondary | ICD-10-CM | POA: Diagnosis not present

## 2020-12-03 DIAGNOSIS — I10 Essential (primary) hypertension: Secondary | ICD-10-CM | POA: Diagnosis not present

## 2020-12-03 DIAGNOSIS — J069 Acute upper respiratory infection, unspecified: Secondary | ICD-10-CM | POA: Diagnosis not present

## 2020-12-03 DIAGNOSIS — R2689 Other abnormalities of gait and mobility: Secondary | ICD-10-CM | POA: Diagnosis not present

## 2020-12-03 DIAGNOSIS — R410 Disorientation, unspecified: Secondary | ICD-10-CM | POA: Diagnosis not present

## 2020-12-03 DIAGNOSIS — R442 Other hallucinations: Secondary | ICD-10-CM | POA: Diagnosis not present

## 2020-12-03 DIAGNOSIS — R2681 Unsteadiness on feet: Secondary | ICD-10-CM | POA: Diagnosis not present

## 2020-12-03 DIAGNOSIS — E039 Hypothyroidism, unspecified: Secondary | ICD-10-CM | POA: Diagnosis not present

## 2020-12-03 DIAGNOSIS — Z4789 Encounter for other orthopedic aftercare: Secondary | ICD-10-CM | POA: Diagnosis not present

## 2020-12-03 LAB — GLUCOSE, CAPILLARY: Glucose-Capillary: 108 mg/dL — ABNORMAL HIGH (ref 70–99)

## 2020-12-03 NOTE — Consult Note (Signed)
   [facility] CM Inpatient Consult   12/03/2020  [name] Pratt August 12, 1941 [id number]   Patient screened for high risk score for unplanned readmission. Chart reviewed to assess for potential [employer] community service needs. Per review, patient is being recommended for a skilled nursing facility level of care.    No [facility] Care Management follow up needs at this time is planned if patient goes to a SNF as care needs would be met at that level of care.   Netta Cedars, MSN, [place] [facility] Liaison Nurse Mobile Phone [phone]  Toll free office [phone]

## 2020-12-03 NOTE — Progress Notes (Addendum)
PROGRESS NOTE    [name] Pratt  GEX:[id number] DOB: March 22, 1941 DOA: 11/24/2020 PCP: [name], NP   No chief complaint on file.   Brief Narrative:   This is a 79 y.o. male with a past medical history of carotid stenosis, COPD, OSA, hypertension, hyperlipidemia, cervical spondylitic myelopathy who was admitted on 11/24/20 by [name], neurosurgery, for numbness and tingling in his hands and difficulty walking. Patient underwent anterior cervical discectomy and fusion to decompress the anterior part of the spinal cord though neurosurgery noted he still had some posterior compression on follow up imaging and so posterior cervical decompression with lateral mass fixation was recommended. Patient underwent decompressive cervical laminectomy C3-4 and C4-5, posterior lateral fusion with lateral mass screw placement at C3, C4 and C5 on the right and C3 and C5 on the left and posterior lateral arthrodesis C3-4 and C4-5 on 11/24/20 with [name]. Since that time, the patient had urinary retention noted on 3/17 requiring foley. He has also had improvement in his presenting symptoms but has been requiring opiates for pain management. Plan was made for patient to be discharged to SNF when able as his wife is unlikely able to handle his care at home. He was noted to be hallucinating on 3/21 which prompted internal medicine consult.  Pt seen and examined at bedside, he is alert and oriented, frustrated about delay in insurance approval.    Assessment & Plan:   Active Problems:   Myelopathy (HCC)   Visual hallucinations  Visual hallucinations of unclear etiology No hallucinations today.  Minimize sedating medications.  No new complaints today.    Hypertension:  Well controlled today.     Hypothyroidism:  Resume synthroid.    COPD  No wheezing heard.  Not in exacerbation.     Hypokalemia  Replaced.    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   DVT prophylaxis: SCDs Code Status: Full code Family Communication: None at bedside Disposition:   Status is: Inpatient  Remains inpatient appropriate because:Unsafe d/c plan       Level of care: Med-Surg  Antimicrobials: None  Subjective: No new complaints.   Objective: Vitals:   12/02/20 2000 12/03/20 0030 12/03/20 0400 12/03/20 0753  BP: 133/72 116/71 126/61 120/65  Pulse: 68 69 71 79  Resp: 19 19 (!) 21 20  Temp: 97.9 F (36.6 C) 98.3 F (36.8 C) 98.1 F (36.7 C) 98.1 F (36.7 C)  TempSrc: Oral Oral Oral Oral  SpO2: 100% 98% 99% 99%  Weight:      Height:        Intake/Output Summary (Last 24 hours) at 12/03/2020 1025 Last data filed at 12/03/2020 0500 Gross per 24 hour  Intake 840 ml  Output 701 ml  Net 139 ml   Filed Weights   11/24/20 1152  Weight: 69.9 kg    Examination:  General exam:alert and comfortable.  Respiratory system: clear to auscultation, no wheezing heard.  Cardiovascular system: S1-S2 heard, regular rate rhythm Gastrointestinal system: Abdomen is soft nontender, nondistended bowel sounds normal Central nervous system: Alert and oriented, grossly nonfocal Extremities: no cyanosis.  Skin: no rashes.  Psychiatry:.  Mood is appropriate    Data Reviewed: I have personally reviewed following labs and imaging studies  CBC: Recent Labs  Lab 11/30/20 1604  WBC 9.1  HGB 12.6*  HCT 37.1*  MCV 95.4  PLT 385  Basic Metabolic Panel: Recent Labs  Lab 11/30/20 1604 12/01/20 1601  NA 135 135  K 3.1* 3.8  CL 98 100  CO2 28 26  GLUCOSE 129* 111*  BUN 23 29*  CREATININE 1.22 1.61*  CALCIUM 8.9 8.9  MG 1.9  --     GFR: Estimated Creatinine Clearance: 33 mL/min (A) (by C-G formula based on SCr of 1.61 mg/dL (H)).  Liver Function Tests: Recent Labs  Lab 11/30/20 1604  AST 26  ALT 15  ALKPHOS 77  BILITOT 0.9  PROT 6.8  ALBUMIN 3.2*     CBG: Recent Labs  Lab 12/02/20 0822 12/02/20 1140 12/02/20 1614 12/02/20 2139 12/03/20 0650  GLUCAP 164*  164* 87 173* 122* 108*     Recent Results (from the past 240 hour(s))  SARS CORONAVIRUS 2 (TAT 6-24 HRS) Nasopharyngeal Nasopharyngeal Swab     Status: None   Collection Time: 12/02/20  4:15 AM   Specimen: Nasopharyngeal Swab  Result Value Ref Range Status   SARS Coronavirus 2 NEGATIVE NEGATIVE Final    Comment: (NOTE) SARS-CoV-2 target nucleic acids are NOT DETECTED.  The SARS-CoV-2 RNA is generally detectable in upper and lower respiratory specimens during the acute phase of infection. Negative results do not preclude SARS-CoV-2 infection, do not rule out co-infections with other pathogens, and should not be used as the sole basis for treatment or other patient management decisions. Negative results must be combined with clinical observations, patient history, and epidemiological information. The expected result is Negative.  Fact Sheet for Patients: SugarRoll.be  Fact Sheet for Healthcare Providers: https://www.woods-mathews.com/  This test is not yet approved or cleared by the [place] FDA and  has been authorized for detection and/or diagnosis of SARS-CoV-2 by FDA under an Emergency Use Authorization (EUA). This EUA will remain  in effect (meaning this test can be used) for the duration of the COVID-19 declaration under Se ction 564(b)(1) of the Act, 21 U.S.C. section 360bbb-3(b)(1), unless the authorization is terminated or revoked sooner.  Performed at [facility], [place] [address], [place], [place] [address]          Radiology Studies: No results found.      Scheduled Meds: . acetaminophen  650 mg Oral TID  . allopurinol  100 mg Oral BID  . aspirin EC  81 mg Oral q morning  . Chlorhexidine Gluconate Cloth  6 each Topical Daily  . cholecalciferol  2,000 Units Oral Daily  .  cycloSPORINE  1 drop Both Eyes BID  . gabapentin  300 mg Oral QHS  . hydrALAZINE  50 mg Oral BID  . insulin aspart  0-15 Units Subcutaneous TID WC  . levothyroxine  150 mcg Oral Once per day on Mon Tue Wed Thu Fri Sat  . levothyroxine  75 mcg Oral Q Sun  . losartan  100 mg Oral QHS  . multivitamin with minerals  1 tablet Oral Daily  . pantoprazole  40 mg Oral QHS  . rosuvastatin  5 mg Oral Daily  . sodium chloride flush  3 mL Intravenous Q12H  . tamsulosin  0.4 mg Oral Daily   Continuous Infusions: . sodium chloride       LOS: 9 days        Hosie Poisson, MD [facility]   To contact the attending provider between 7A-7P or the covering provider during after hours 7P-7A, please log into the web site [email or web address] and access using universal [place] password for that web site. If  you do not have the password, please call the hospital operator.  12/03/2020, 10:25 AM

## 2020-12-03 NOTE — TOC Transition Note (Signed)
Transition of Care [facility]) - CM/SW Discharge Note   Patient Details  Name: TAELON BENDORF MRN: [id number] Date of Birth: 05-06-1941  [facility] CM/SW Contact:  Geralynn Ochs, LCSW Phone Number: 12/03/2020, 10:15 AM   Clinical Narrative:   Nurse to call report to [phone], Room 108.    Final next level of care: Skilled Nursing Facility Barriers to Discharge: Barriers Resolved   Patient Goals and CMS Choice   CMS Medicare.gov Compare Post Acute Care list provided to:: Patient Choice offered to / list presented to : [facility]  Discharge Placement              Patient chooses bed at: [place] [facility] Patient to be transferred to facility by: [place] Name of family member notified: Pamala Hurry Patient and family notified of of transfer: 12/03/20  Discharge Plan and Services   Discharge Planning Services: CM Consult Post Acute Care Choice: [place]: PT,OT [facility] Agency: [place] Date [place]: 11/25/20   Representative spoke with at [place]: [place] ([place]) Interventions     Readmission Risk Interventions No flowsheet data found.

## 2020-12-03 NOTE — Discharge Summary (Signed)
Physician Discharge Summary  Patient ID: [name] Pratt MRN: [id number] DOB/AGE: November 01, 1940 80 y.o.  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 The patient tolerated the procedure well and was taken to the recovery room and then to the floor in stable condition. The hospital course was routine. There were no complications. The wound remained clean dry and intact. Pt had appropriate neck soreness. No complaints of arm pain or new N/T/W. The patient remained afebrile with stable vital signs, and tolerated a regular diet. The patient continued to increase activities, and pain was well controlled with oral pain medications.   Consults: hospitalists  Significant Diagnostic Studies:  Results for orders placed or performed during the hospital encounter of 11/24/20  SARS CORONAVIRUS 2 (TAT 6-24 HRS) Nasopharyngeal Nasopharyngeal Swab   Specimen: Nasopharyngeal Swab  Result Value Ref Range   SARS Coronavirus 2 NEGATIVE NEGATIVE  Glucose, capillary  Result Value Ref Range   Glucose-Capillary 185 (H) 70 - 99 mg/dL  Glucose, capillary  Result Value Ref Range   Glucose-Capillary 110 (H) 70 - 99 mg/dL  Glucose, capillary  Result Value Ref Range   Glucose-Capillary 121 (H) 70 - 99 mg/dL  Glucose, capillary  Result Value Ref Range   Glucose-Capillary 188 (H) 70 - 99 mg/dL  Glucose, capillary  Result Value Ref Range   Glucose-Capillary 116 (H) 70 - 99 mg/dL  Glucose, capillary  Result Value Ref Range   Glucose-Capillary 112 (H) 70 - 99 mg/dL  Glucose, capillary  Result Value Ref Range   Glucose-Capillary 103 (H) 70 - 99 mg/dL  Glucose, capillary  Result Value Ref  Range   Glucose-Capillary 135 (H) 70 - 99 mg/dL  Glucose, capillary  Result Value Ref Range   Glucose-Capillary 112 (H) 70 - 99 mg/dL  Glucose, capillary  Result Value Ref Range   Glucose-Capillary 120 (H) 70 - 99 mg/dL  Glucose, capillary  Result Value Ref Range   Glucose-Capillary 123 (H) 70 - 99 mg/dL   Comment 1 Notify RN    Comment 2 Document in Chart   Glucose, capillary  Result Value Ref Range   Glucose-Capillary 117 (H) 70 - 99 mg/dL   Comment 1 Notify RN    Comment 2 Document in Chart   Glucose, capillary  Result Value Ref Range   Glucose-Capillary 118 (H) 70 - 99 mg/dL  Glucose, capillary  Result Value Ref Range   Glucose-Capillary 98 70 - 99 mg/dL  Glucose, capillary  Result Value Ref Range   Glucose-Capillary 117 (H) 70 - 99 mg/dL   Comment 1 Notify RN    Comment 2 Document in Chart   Glucose, capillary  Result Value Ref Range   Glucose-Capillary 113 (H) 70 - 99 mg/dL   Comment 1 Notify RN    Comment 2 Document in Chart   Glucose, capillary  Result Value Ref Range   Glucose-Capillary 117 (H) 70 - 99 mg/dL   Comment 1 Notify RN    Comment 2 Document in Chart   Glucose, capillary  Result Value Ref Range   Glucose-Capillary 122 (H) 70 - 99 mg/dL   Comment 1 Notify RN    Comment 2 Document in Chart   Glucose, capillary  Result Value Ref Range  Glucose-Capillary 320 (H) 70 - 99 mg/dL  Glucose, capillary  Result Value Ref Range   Glucose-Capillary 78 70 - 99 mg/dL  Glucose, capillary  Result Value Ref Range   Glucose-Capillary 163 (H) 70 - 99 mg/dL  Glucose, capillary  Result Value Ref Range   Glucose-Capillary 150 (H) 70 - 99 mg/dL  Glucose, capillary  Result Value Ref Range   Glucose-Capillary 122 (H) 70 - 99 mg/dL  CBC  Result Value Ref Range   WBC 9.1 4.0 - 10.5 K/uL   RBC 3.89 (L) 4.22 - 5.81 MIL/uL   Hemoglobin 12.6 (L) 13.0 - 17.0 g/dL   HCT 37.1 (L) 39.0 - 52.0 %   MCV 95.4 80.0 - 100.0 fL   MCH 32.4 26.0 - 34.0 pg   MCHC 34.0 30.0 -  36.0 g/dL   RDW 12.5 11.5 - 15.5 %   Platelets 385 150 - 400 K/uL   nRBC 0.0 0.0 - 0.2 %  Comprehensive metabolic panel  Result Value Ref Range   Sodium 135 135 - 145 mmol/L   Potassium 3.1 (L) 3.5 - 5.1 mmol/L   Chloride 98 98 - 111 mmol/L   CO2 28 22 - 32 mmol/L   Glucose, Bld 129 (H) 70 - 99 mg/dL   BUN 23 8 - 23 mg/dL   Creatinine, Ser 1.22 0.61 - 1.24 mg/dL   Calcium 8.9 8.9 - 10.3 mg/dL   Total Protein 6.8 6.5 - 8.1 g/dL   Albumin 3.2 (L) 3.5 - 5.0 g/dL   AST 26 15 - 41 U/L   ALT 15 0 - 44 U/L   Alkaline Phosphatase 77 38 - 126 U/L   Total Bilirubin 0.9 0.3 - 1.2 mg/dL   GFR, Estimated 60 (L) >60 mL/min   Anion gap 9 5 - 15  Magnesium  Result Value Ref Range   Magnesium 1.9 1.7 - 2.4 mg/dL  Glucose, capillary  Result Value Ref Range   Glucose-Capillary 142 (H) 70 - 99 mg/dL  Urinalysis, Routine w reflex microscopic  Result Value Ref Range   Color, Urine STRAW (A) YELLOW   APPearance CLEAR CLEAR   Specific Gravity, Urine 1.006 1.005 - 1.030   pH 7.0 5.0 - 8.0   Glucose, UA NEGATIVE NEGATIVE mg/dL   Hgb urine dipstick SMALL (A) NEGATIVE   Bilirubin Urine NEGATIVE NEGATIVE   Ketones, ur NEGATIVE NEGATIVE mg/dL   Protein, ur NEGATIVE NEGATIVE mg/dL   Nitrite NEGATIVE NEGATIVE   Leukocytes,Ua NEGATIVE NEGATIVE   RBC / HPF 6-10 0 - 5 RBC/hpf   WBC, UA 0-5 0 - 5 WBC/hpf   Bacteria, UA NONE SEEN NONE SEEN  TSH  Result Value Ref Range   TSH 5.516 (H) 0.350 - 4.500 uIU/mL  Glucose, capillary  Result Value Ref Range   Glucose-Capillary 169 (H) 70 - 99 mg/dL   Comment 1 Notify RN    Comment 2 Document in Chart   Glucose, capillary  Result Value Ref Range   Glucose-Capillary 126 (H) 70 - 99 mg/dL  Basic metabolic panel  Result Value Ref Range   Sodium 135 135 - 145 mmol/L   Potassium 3.8 3.5 - 5.1 mmol/L   Chloride 100 98 - 111 mmol/L   CO2 26 22 - 32 mmol/L   Glucose, Bld 111 (H) 70 - 99 mg/dL   BUN 29 (H) 8 - 23 mg/dL   Creatinine, Ser 1.61 (H) 0.61 - 1.24  mg/dL   Calcium 8.9 8.9 - 10.3 mg/dL   GFR,  Estimated 43 (L) >60 mL/min   Anion gap 9 5 - 15  Glucose, capillary  Result Value Ref Range   Glucose-Capillary 160 (H) 70 - 99 mg/dL  Glucose, capillary  Result Value Ref Range   Glucose-Capillary 129 (H) 70 - 99 mg/dL  Glucose, capillary  Result Value Ref Range   Glucose-Capillary 147 (H) 70 - 99 mg/dL  Glucose, capillary  Result Value Ref Range   Glucose-Capillary 126 (H) 70 - 99 mg/dL  Glucose, capillary  Result Value Ref Range   Glucose-Capillary 164 (H) 70 - 99 mg/dL   Comment 1 Notify RN    Comment 2 Document in Chart   Glucose, capillary  Result Value Ref Range   Glucose-Capillary 164 (H) 70 - 99 mg/dL   Comment 1 Notify RN    Comment 2 Document in Chart   Glucose, capillary  Result Value Ref Range   Glucose-Capillary 87 70 - 99 mg/dL   Comment 1 Notify RN    Comment 2 Document in Chart   Glucose, capillary  Result Value Ref Range   Glucose-Capillary 173 (H) 70 - 99 mg/dL   Comment 1 Notify RN    Comment 2 Document in Chart   Glucose, capillary  Result Value Ref Range   Glucose-Capillary 122 (H) 70 - 99 mg/dL   Comment 1 Notify RN    Comment 2 Document in Chart   Glucose, capillary  Result Value Ref Range   Glucose-Capillary 108 (H) 70 - 99 mg/dL   Comment 1 Notify RN    Comment 2 Document in Chart     DG Cervical Spine 2-3 Views  Result Date: 11/24/2020 CLINICAL DATA:  Posterior cervical laminectomy C3-C4, C4-C5 with lateral mass fusion. EXAM: DG C-ARM 1-60 MIN; CERVICAL SPINE - 2-3 VIEW FLUOROSCOPY TIME:  Fluoroscopy Time:  10 seconds. Radiation Exposure Index (if provided by the fluoroscopic device): 1.04 mGy. Number of Acquired Spot Images: 2 COMPARISON:  October 21, 2020. FINDINGS: Two C-arm fluoroscopic images were obtained intraoperatively and submitted for post operative interpretation. These images demonstrate lateral mass screws on the right at C3, C4, and C5 and on the left at C3 and C5 with intervening  rods. Suspected laminectomies, suboptimally evaluated by fluoroscopy. Prior ACDF with intervening spacer at C3-C4. Please see the performing provider's procedural report for further detail. IMPRESSION: Intraoperative fluoroscopy, as detailed above. Electronically Signed   By: Margaretha Sheffield MD   On: 11/24/2020 16:46   DG C-Arm 1-60 Min  Result Date: 11/24/2020 CLINICAL DATA:  Posterior cervical laminectomy C3-C4, C4-C5 with lateral mass fusion. EXAM: DG C-ARM 1-60 MIN; CERVICAL SPINE - 2-3 VIEW FLUOROSCOPY TIME:  Fluoroscopy Time:  10 seconds. Radiation Exposure Index (if provided by the fluoroscopic device): 1.04 mGy. Number of Acquired Spot Images: 2 COMPARISON:  October 21, 2020. FINDINGS: Two C-arm fluoroscopic images were obtained intraoperatively and submitted for post operative interpretation. These images demonstrate lateral mass screws on the right at C3, C4, and C5 and on the left at C3 and C5 with intervening rods. Suspected laminectomies, suboptimally evaluated by fluoroscopy. Prior ACDF with intervening spacer at C3-C4. Please see the performing provider's procedural report for further detail. IMPRESSION: Intraoperative fluoroscopy, as detailed above. Electronically Signed   By: Margaretha Sheffield MD   On: 11/24/2020 16:46    Antibiotics:  Anti-infectives (From admission, onward)   Start     Dose/Rate Route Frequency Ordered Stop   11/25/20 0600  ceFAZolin (ANCEF) IVPB 2g/100 mL premix  2 g 200 mL/hr over 30 Minutes Intravenous On call to O.R. 11/24/20 1345 11/24/20 1445   11/24/20 2300  ceFAZolin (ANCEF) IVPB 2g/100 mL premix        2 g 200 mL/hr over 30 Minutes Intravenous Every 8 hours 11/24/20 1842 11/25/20 0654   11/24/20 1346  ceFAZolin (ANCEF) 2-4 GM/100ML-% IVPB  Status:  Discontinued       Note to Pharmacy: Granville Lewis, Lindsi   : cabinet override      11/24/20 1346 11/24/20 1352   11/24/20 1157  ceFAZolin (ANCEF) 2-4 GM/100ML-% IVPB       Note to Pharmacy: Cordelia Pen   : cabinet override      11/24/20 1157 11/24/20 1449      Discharge Exam: Blood pressure 120/65, pulse 79, temperature 98.1 F (36.7 C), temperature source Oral, resp. rate 20, height '5\' 6"'  (1.676 m), weight 69.9 kg, SpO2 99 %. Neurologic: Grossly normal   Discharge Medications:   Allergies as of 12/03/2020      Reactions   Pravastatin Other (See Comments)   Muscle cramps   Sildenafil Other (See Comments)   Tachycardia   Atenolol Other (See Comments)   bradycardia   Elastic Bandages & [zinc] Other (See Comments)   Teflon bandages - Irritation   Metoclopramide Hcl Anxiety      Medication List    TAKE these medications   acetaminophen 650 MG CR tablet Commonly known as: TYLENOL Take 650-1,300 mg by mouth See admin instructions. Take 1300 mg in the morning and 650 g at bedtime   albuterol 108 (90 Base) MCG/ACT inhaler Commonly known as: VENTOLIN HFA Inhale 2 puffs into the lungs every 6 (six) hours as needed for shortness of breath.   Alcohol Pads 70 % Pads 1 packet by Does not apply route 2 (two) times daily.   allopurinol 100 MG tablet Commonly known as: ZYLOPRIM TAKE 1 TABLET TWICE DAILY   amLODipine 5 MG tablet Commonly known as: NORVASC Take 1 tablet (5 mg total) by mouth daily.   aspirin EC 81 MG tablet Take 81 mg by mouth every morning.   blood glucose meter kit and supplies Dispense based on patient and insurance preference. Use up to four times daily as directed. (FOR ICD-10 E10.9, E11.9).   colchicine 0.6 MG tablet Take 1 tablet by mouth daily as needed (Gout).   Cranberry 500 MG Tabs Take 500 mg by mouth daily.   cycloSPORINE 0.05 % ophthalmic emulsion Commonly known as: RESTASIS Place 1 drop into both eyes 2 (two) times daily.   diclofenac Sodium 1 % Gel Commonly known as: VOLTAREN Apply 2 g topically daily as needed (Hand pain).   docusate sodium 100 MG capsule Commonly known as: Colace Take 1 capsule (100 mg total) by mouth 2 (two)  times daily as needed for mild constipation. What changed:   when to take this  reasons to take this   Fish Oil 1200 MG Caps Take 1,200 mg by mouth daily.   gabapentin 300 MG capsule Commonly known as: NEURONTIN TAKE 1 CAPSULE TWO TO THREE TIMES DAILY AS NEEDED What changed: See the new instructions.   glucose blood test strip TRUE Metrix Blood Glucose Test Strip, use as instructed   hydrALAZINE 50 MG tablet Commonly known as: APRESOLINE Take 50 mg by mouth 2 (two) times daily.   Lancets Misc 1 each by Does not apply route 2 (two) times daily. TRUE matrix lancets   levothyroxine 150 MCG tablet Commonly known  as: SYNTHROID TAKE 1 TABLET ONE TIME DAILY ON 6 DAYS PER WEEK AND 1/2 TABLET ON 1 DAY PER WEEK What changed: See the new instructions.   losartan 100 MG tablet Commonly known as: COZAAR Take 1 tablet (100 mg total) by mouth daily. What changed: when to take this   multivitamin per tablet Take 1 tablet by mouth daily.   nitroGLYCERIN 0.4 MG SL tablet Commonly known as: Nitrostat Place 1 tablet (0.4 mg total) under the tongue every 5 (five) minutes as needed for chest pain.   rosuvastatin 5 MG tablet Commonly known as: CRESTOR TAKE 1 TABLET EVERY DAY   sodium chloride 0.65 % nasal spray Commonly known as: OCEAN Place 1 spray into both nostrils at bedtime as needed for congestion.   Soothe XP Soln Place 1 drop into both eyes 2 (two) times daily as needed (Dry eyes).   Theraworx Relief Foam Apply 1 application topically 2 (two) times daily as needed (Pain).   Vitamin D 50 MCG (2000 UT) Caps Take 2,000 Units by mouth daily.   ZIMS MAX-FREEZE EX Apply 1 application topically daily as needed (pain).       Disposition: snf   Final Dx: posterior cervical decompression and fusion C3-5  Discharge Instructions    Call MD for:  difficulty breathing, headache or visual disturbances   Complete by: As directed    Call MD for:  hives   Complete by: As  directed    Call MD for:  persistant dizziness or light-headedness   Complete by: As directed    Call MD for:  persistant nausea and vomiting   Complete by: As directed    Call MD for:  redness, tenderness, or signs of infection (pain, swelling, redness, odor or green/yellow discharge around incision site)   Complete by: As directed    Call MD for:  severe uncontrolled pain   Complete by: As directed    Call MD for:  temperature >100.4   Complete by: As directed    Diet - low sodium heart healthy   Complete by: As directed    Driving Restrictions   Complete by: As directed    No driving for 2 weeks, no riding in the car for 1 week   Face-to-face encounter (required for Medicare/Medicaid patients)   Complete by: As directed    I Elaina Hoops certify that this patient is under my care and that I, or a nurse practitioner or physician's assistant working with me, had a face-to-face encounter that meets the physician face-to-face encounter requirements with this patient on 11/26/2020. The encounter with the patient was in whole, or in part for the following medical condition(s) which is the primary reason for home health care (List medical condition): Cervical spondylitic myelopathy   The encounter with the patient was in whole, or in part, for the following medical condition, which is the primary reason for home health care: Myelopathy   I certify that, based on my findings, the following services are medically necessary home health services: Physical therapy   Reason for Medically Necessary Home Health Services: Therapy- Therapeutic Exercises to Increase Strength and Endurance   My clinical findings support the need for the above services: Pain interferes with ambulation/mobility   Further, I certify that my clinical findings support that this patient is homebound due to: Pain interferes with ambulation/mobility   Home Health   Complete by: As directed    To provide the following  care/treatments: PT   Increase activity  slowly   Complete by: As directed    Lifting restrictions   Complete by: As directed    No lifting more than 8 lbs   No wound care   Complete by: As directed        Contact information for follow-up providers    [facility] Follow up.   Specialty: Home Health Services Why: The home health agency will contact you for the first home visit. Contact information: [place] [address] [phone]        Irine Seal, MD Follow up in 1 week(s).   Specialty: Urology Contact information: [address] [place] [address] [phone]            Contact information for after-discharge care    Destination    [facility] Preferred SNF .   Service: Skilled Nursing Contact information: [address] [place] [phone]                   Signed: Ocie Cornfield [facility] 12/03/2020, 9:33 AM

## 2020-12-03 NOTE — Progress Notes (Signed)
PT Cancellation Note  Patient Details Name: [name] Pratt MRN: [id number] DOB: [birth year]   Cancelled Treatment:    Reason Eval/Treat Not Completed: Other (comment) (Per RN transport is on the way to get him to go to SNF)   Lyanne Co, DPT Acute Rehabilitation Services [phone]   [place] 12/03/2020, 11:02 AM

## 2020-12-06 ENCOUNTER — Encounter: Payer: Self-pay | Admitting: Family Medicine

## 2020-12-06 ENCOUNTER — Non-Acute Institutional Stay (SKILLED_NURSING_FACILITY): Payer: Medicare HMO | Admitting: Family Medicine

## 2020-12-06 DIAGNOSIS — Z9989 Dependence on other enabling machines and devices: Secondary | ICD-10-CM

## 2020-12-06 DIAGNOSIS — E7800 Pure hypercholesterolemia, unspecified: Secondary | ICD-10-CM

## 2020-12-06 DIAGNOSIS — I6523 Occlusion and stenosis of bilateral carotid arteries: Secondary | ICD-10-CM | POA: Diagnosis not present

## 2020-12-06 DIAGNOSIS — G959 Disease of spinal cord, unspecified: Secondary | ICD-10-CM | POA: Diagnosis not present

## 2020-12-06 DIAGNOSIS — I10 Essential (primary) hypertension: Secondary | ICD-10-CM

## 2020-12-06 DIAGNOSIS — G4733 Obstructive sleep apnea (adult) (pediatric): Secondary | ICD-10-CM

## 2020-12-06 DIAGNOSIS — I700 Atherosclerosis of aorta: Secondary | ICD-10-CM

## 2020-12-06 DIAGNOSIS — E039 Hypothyroidism, unspecified: Secondary | ICD-10-CM

## 2020-12-06 NOTE — Progress Notes (Signed)
Provider: Lillette Boxer. Sabra Heck MD  Location:    [place] Room Number: 108/P Place of Service:  SNF (31)  PCP: [name], NP Patient Care Team: [name], NP as PCP - General (Internal Medicine) [name], MD as PCP - Cardiology (Cardiology) Zadie Rhine Clent Demark, MD as Consulting Physician (Ophthalmology) [name], MD as Consulting Physician (Cardiology) Dohmeier, Asencion Partridge, MD as Consulting Physician (Neurology) Marshell Garfinkel, MD as Consulting Physician (Pulmonary Disease) Darleen Crocker, MD as Consulting Physician (Ophthalmology) Nyra Capes, DDS as Consulting Physician (Dentistry) Day, Melvenia Beam, [facility] (Inactive) as Pharmacist (Pharmacist)  Extended Emergency Contact Information Primary Emergency Contact: Marko Plume Address: [address]          [place], [place] [address] [place] Phone: [phone] Mobile Phone: [phone] Relation: Spouse Secondary Emergency Contact: Ines Bloomer Address: [place].          [address], [place] Phone: [phone] Mobile Phone: [phone] Relation: Son  Code Status: Full Code Goals of Care: Advanced Directive information Advanced Directives 12/06/2020  Does Patient Have a Medical Advance Directive? Yes  Type of Paramedic of [place];Living will  Does patient want to make changes to medical advance directive? No - Patient declined  Copy of [place] in Chart? Yes - validated most recent copy scanned in chart (See row information)  Would patient like information on creating a medical advance directive? -  Pre-existing out of facility DNR order (yellow form or pink MOST form) -      Chief Complaint  Patient presents with  . New Admit To SNF    New Admission to [facility]     HPI: Patient is a 80 y.o. male seen today for admission to [facility] skilled  nursing facility for rehab after neck surgery.  4 months prior to this admission he was seen by neurosurgery for weakness in his hands with numbness tingling and difficulty walking.  He has a history of cervical spondylitic myelopathy.  4 months ago and anterior neck procedure was performed to decompress the anterior part of his spinal cord.  Symptoms initially improved but then seemed to worsen so it was recommended that he have a posterior cervical decompression with a lateral mass fixation.  This was accomplished on this hospital admission prior to his coming to [facility].  Surgery went well without any complications.  Pain is minimal and is controlled with muscle relaxants and Tylenol at this time Other medical issues include emphysema, aortic atherosclerosis, carotid stenosis,.  He has had multiple surgical procedures in the past including several eye surgeries for type II macular telangiectasias, bilateral knee replacements, laparoscopic cholecystectomy, lithotripsy x2, total hip arthroplasty.  Past Medical History:  Diagnosis Date  . Aortic atherosclerosis ([place]) 03/05/2020  . Arthritis   . Cancer ([place])    skin cancer  . Carotid stenosis    a. Carotid U/S 9/74: LICA < 16%, RICA 38-45%;  b.  Carotid U/S 3/64:  RICA 68-03%; LICA 2-12%; f/u 1 year  . Chest pain     Myoview in 2008 was normal.  Echo 7/09: EF 60%, normal wall motion, mild LVH, mild LAE.  [name] Complication of anesthesia    "stomach does not wake up"  . COPD (chronic obstructive pulmonary disease) ([place])   . Emphysema, unspecified ([place]) 03/05/2020  . GERD (gastroesophageal reflux disease)   . Gout   . History of chicken pox   .  History of hemorrhoids   . History of kidney stones   . Hyperlipidemia    under control  . Hypertension    under control  . Hypothyroidism   . Ileus ([place])   . Inguinal hernia   . OSA (obstructive sleep apnea)   . Pancreatitis   . PONV (postoperative nausea and vomiting)   . Rosacea   . 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   Past Surgical History:  Procedure Laterality Date  . ABDOMINAL HERNIA REPAIR  07/15/12   Dr [name]  . ANTERIOR CERVICAL DECOMP/DISCECTOMY FUSION N/A 07/09/2020   Procedure: ANTERIOR CERVICAL DECOMPRESSION AND FUSION CERVICAL THREE-FOUR.;  Surgeon: [name], MD;  Location: [facility];  Service: Neurosurgery;  Laterality: N/A;  anterior  . BROW LIFT  05/07/01  . CYSTOSCOPY WITH URETEROSCOPY AND STENT PLACEMENT Right 01/28/2014   Procedure: CYSTOSCOPY WITH URETEROSCOPY, BASKET RETRIVAL AND  STENT PLACEMENT;  Surgeon: [name], MD;  Location: [facility];  Service: Urology;  Laterality: Right;  . epidural injections     multiple procedures  . [place]   multiple times  . EYE SURGERY Bilateral 03/25/10, 2012   cataract removal  . HEMORRHOID SURGERY  2014  . HIATAL HERNIA REPAIR  12/21/93  . HOLMIUM LASER APPLICATION Right 7/34/2876   Procedure: HOLMIUM LASER APPLICATION;  Surgeon: [name], MD;  Location: [facility];  Service: Urology;  Laterality: Right;  . INGUINAL HERNIA REPAIR Right 07/15/12   Dr [name], x2  . JOINT REPLACEMENT  07/25/04   right knee  . JOINT REPLACEMENT  07/13/10   left knee  . KNEE SURGERY  1995  . LAPAROSCOPIC CHOLECYSTECTOMY  09/20/06   with intraoperative cholangiogram and right inguinal herniorrhaphy with mesh   . LIGAMENT REPAIR Left 07/2013   shoulder  . LITHOTRIPSY     x2  . NASAL SEPTUM SURGERY  1975  . POSTERIOR CERVICAL FUSION/FORAMINOTOMY N/A 11/24/2020   Procedure: Posterior Cervical Laminectomy Cervical three-four, Cervical four-five with lateral mass fusion;  Surgeon: [name], MD;  Location: [facility];  Service: Neurosurgery;  Laterality: N/A;  . REFRACTIVE SURGERY Bilateral   . 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 SKIN CANCER DESTRUCTION     nose, ear  . TONSILLECTOMY  as child  . TOTAL HIP ARTHROPLASTY Left   . URETHRAL DILATION  1991   [name]     reports that he quit smoking about 42 years ago. His smoking use included cigarettes. He quit after 11.00 years of use. He has never used smokeless tobacco. He reports previous alcohol use. He reports that he does not use drugs. Social History   Socioeconomic History  . Marital status: Married    Spouse name: Not on file  . Number of children: 1  . Years of education: Not on file  . Highest education level: Not on file  Occupational History  . 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 Smoking status: Former Smoker    Years: 11.00    Types: Cigarettes    Quit date: 09/11/1978    Years since quitting: 42.2  . Smokeless tobacco: Never Used  Vaping Use  . Vaping Use: Never used  Substance and Sexual Activity  . Alcohol use: Not Currently    Alcohol/week: 0.0 standard drinks    Comment: rare  . Drug use: No  . Sexual activity: Not  on file  Other Topics Concern  . Not on file  Social History Narrative   Lives with his wife   He has one son- lives locally.   2 grandchildren   Has worked for fire department   Enjoys wood working   Completed HS.  [employer]   Social Determinants of Health   Financial Resource Strain: Not on file  Food Insecurity: Not on file  Transportation Needs: Not on file  Physical Activity: Not on file  Stress: Not on file  Social Connections: Not on file  Intimate Partner Violence: Not on file    Functional Status Survey:    Family History  Problem Relation Age of Onset  . Cancer Mother   . Hypertension Other   . Cancer Other   . Osteoarthritis Other     Health Maintenance  Topic Date Due  . TETANUS/TDAP  12/26/2020  . FOOT EXAM  09/29/2021  . INFLUENZA VACCINE  Completed  . COVID-19 Vaccine  Completed  . PNA vac Low Risk Adult  Completed  . HPV VACCINES  Aged Out  . HEMOGLOBIN A1C  Discontinued  . OPHTHALMOLOGY EXAM  Discontinued    Allergies  Allergen Reactions  . Pravastatin Other (See  Comments)    Muscle cramps  . Sildenafil Other (See Comments)    Tachycardia    . Metoclopramide   . Atenolol Other (See Comments)    bradycardia  . Elastic Bandages & [Zinc] Other (See Comments)    Teflon bandages - Irritation  . Metoclopramide Hcl Anxiety    Allergies as of 12/06/2020      Reactions   Pravastatin Other (See Comments)   Muscle cramps   Sildenafil Other (See Comments)   Tachycardia   Metoclopramide    Atenolol Other (See Comments)   bradycardia   Elastic Bandages & [zinc] Other (See Comments)   Teflon bandages - Irritation   Metoclopramide Hcl Anxiety      Medication List       Accurate as of December 06, 2020  9:30 AM. If you have any questions, ask your nurse or doctor.        acetaminophen 650 MG CR tablet Commonly known as: TYLENOL Take 650-1,300 mg by mouth See admin instructions. Take 1300 mg in the morning and 650 g at bedtime   albuterol 108 (90 Base) MCG/ACT inhaler Commonly known as: VENTOLIN HFA Inhale 2 puffs into the lungs every 6 (six) hours as needed for shortness of breath.   Alcohol Pads 70 % Pads 1 packet by Does not apply route 2 (two) times daily.   allopurinol 100 MG tablet Commonly known as: ZYLOPRIM TAKE 1 TABLET TWICE DAILY   amLODipine 5 MG tablet Commonly known as: NORVASC Take 1 tablet (5 mg total) by mouth daily.   aspirin EC 81 MG tablet Take 81 mg by mouth every morning.   blood glucose meter kit and supplies Dispense based on patient and insurance preference. Use up to four times daily as directed. (FOR ICD-10 E10.9, E11.9).   colchicine 0.6 MG tablet Take 1 tablet by mouth daily as needed (Gout).   Cranberry 500 MG Tabs Take 500 mg by mouth daily.   cycloSPORINE 0.05 % ophthalmic emulsion Commonly known as: RESTASIS Place 1 drop into both eyes 2 (two) times daily.   diclofenac Sodium 1 % Gel Commonly known as: VOLTAREN Apply 2 g topically daily as needed (Hand pain).   docusate sodium 100 MG  capsule Commonly known as: Colace Take   1 capsule (100 mg total) by mouth 2 (two) times daily as needed for mild constipation.   Fish Oil 1200 MG Caps Take 1,200 mg by mouth daily.   gabapentin 300 MG capsule Commonly known as: NEURONTIN Take 300 mg by mouth 2 (two) times daily as needed. What changed: Another medication with the same name was removed. Continue taking this medication, and follow the directions you see here. Changed by:  , MD   glucose blood test strip TRUE Metrix Blood Glucose Test Strip, use as instructed   hydrALAZINE 50 MG tablet Commonly known as: APRESOLINE Take 50 mg by mouth 2 (two) times daily.   Lancets Misc 1 each by Does not apply route 2 (two) times daily. TRUE matrix lancets   levothyroxine 150 MCG tablet Commonly known as: SYNTHROID Give 150 mg by mouth on Monday through Saturday and on Sunday give 75 mg a half tablet What changed: Another medication with the same name was removed. Continue taking this medication, and follow the directions you see here. Changed by:  , MD   losartan 100 MG tablet Commonly known as: COZAAR Take 1 tablet (100 mg total) by mouth daily.   multivitamin per tablet Take 1 tablet by mouth daily.   nitroGLYCERIN 0.4 MG SL tablet Commonly known as: Nitrostat Place 1 tablet (0.4 mg total) under the tongue every 5 (five) minutes as needed for chest pain.   rosuvastatin 5 MG tablet Commonly known as: CRESTOR TAKE 1 TABLET EVERY DAY   sodium chloride 0.65 % nasal spray Commonly known as: OCEAN Place 1 spray into both nostrils at bedtime as needed for congestion.   Soothe XP Soln Place 1 drop into both eyes 2 (two) times daily as needed (Dry eyes).   Theraworx Relief Foam Apply 1 application topically 2 (two) times daily as needed (Pain).   Vitamin D 50 MCG (2000 UT) Caps Take 2,000 Units by mouth daily.   ZIMS MAX-FREEZE EX Apply 1 application topically daily as needed (pain).        Review of Systems  Constitutional: Negative.   HENT: Negative.   Eyes: Positive for redness and visual disturbance.  Respiratory: Negative.   Cardiovascular: Negative.   Gastrointestinal: Negative.   Endocrine: Negative.   Genitourinary: Positive for difficulty urinating.  Musculoskeletal: Positive for arthralgias and gait problem.  Neurological: Positive for weakness and numbness.  Psychiatric/Behavioral: Negative.   All other systems reviewed and are negative.   Vitals:   12/06/20 0913  BP: 106/62  Pulse: 69  Resp: 18  Temp: 98.2 F (36.8 C)  SpO2: 96%  Weight: 154 lb (69.9 kg)  Height: 5' 6" (1.676 m)   Body mass index is 24.86 kg/m. Physical Exam Vitals and nursing note reviewed.  Constitutional:      Appearance: Normal appearance.  HENT:     Head: Normocephalic.     Mouth/Throat:     Mouth: Mucous membranes are moist.  Neck:     Comments: Bandage in place posteriorly but there is no drainage or blood on the bandage Cardiovascular:     Rate and Rhythm: Normal rate. Rhythm irregular.     Pulses: Normal pulses.  Pulmonary:     Effort: Pulmonary effort is normal.     Breath sounds: Normal breath sounds.  Abdominal:     General: Abdomen is flat. Bowel sounds are normal.  Neurological:     General: No focal deficit present.     Mental Status: He is alert and oriented to person,   place, and time.     Cranial Nerves: Cranial nerves are intact.     Sensory: Sensation is intact.     Motor: Motor function is intact.     Coordination: Coordination is intact.     Gait: Gait abnormal.  Psychiatric:        Attention and Perception: Attention and perception normal.        Mood and Affect: Mood normal.        Speech: Speech normal.        Behavior: Behavior normal.     Labs reviewed: Basic Metabolic Panel: Recent Labs    03/05/20 0334 06/15/20 1235 11/22/20 1332 11/30/20 1604 12/01/20 1601  NA 138   < > 143 135 135  K 3.9   < > 4.1 3.1* 3.8  CL 103    < > 106 98 100  CO2 27   < > _0 GLUCOSE 90   < > 106* 129* 111*  BUN 29*   < > 16 23 29*  CREATININE 1.49*   < > 1.26* 1.22 1.61*  CALCIUM 9.1   < > 9.3 8.9 8.9  MG 2.1  --   --  1.9  --   PHOS 2.5  --   --   --   --    < > = values in this interval not displayed.   Liver Function Tests: Recent Labs    03/03/20 2232 06/15/20 1235 09/29/20 1024 11/30/20 1604  AST 46* _1 ALT _2 ALKPHOS 41  --  72 77  BILITOT 0.5 0.5 0.5 0.9  PROT 7.9 7.1 6.7 6.8  ALBUMIN 4.2  --  4.1 3.2*   No results for input(s): LIPASE, AMYLASE in the last 8760 hours. No results for input(s): AMMONIA in the last 8760 hours. CBC: Recent Labs    02/20/20 1555 03/03/20 2232 03/04/20 1948 07/07/20 1315 11/22/20 1332 11/30/20 1604  WBC 6.6 6.4   < > 5.3 7.1 9.1  NEUTROABS 4,191 5.1  --   --   --   --   HGB 11.6* 12.4*   < > 11.5* 13.0 12.6*  HCT 34.1* 38.0*   < > 36.0* 40.5 37.1*  MCV 97.7 98.2   < > 100.0 100.5* 95.4  PLT 382 351   < > 373 306 385   < > = values in this interval not displayed.   Cardiac Enzymes: No results for input(s): CKTOTAL, CKMB, CKMBINDEX, TROPONINI in the last 8760 hours. BNP: Invalid input(s): POCBNP Lab Results  Component Value Date   HGBA1C 5.3 09/29/2020   Lab Results  Component Value Date   TSH 5.516 (H) 11/30/2020   Lab Results  Component Value Date   HWTUUEKC00 349 03/04/2020   Lab Results  Component Value Date   FOLATE >23.2 06/19/2016   Lab Results  Component Value Date   IRON 105 11/13/2019   TIBC 323 11/13/2019   FERRITIN 491 (H) 11/13/2019    Imaging and Procedures obtained prior to SNF admission: No results found.  Assessment/Plan There are no diagnoses linked to this encounter. 1. Essential hypertension Blood pressure has not been an issue recently.  Currently controlled with losartan 100 mg daily  2. Bilateral carotid artery stenosis Patient is currently asymptomatic  3. Aortic atherosclerosis  (HCC) Medications include rosuvastatin as well as antihypertensives hydralazine and losartan  4. OSA on CPAP We will continue on CPAP at bedtime  5. Hypothyroidism, unspecified type  Currently taking levothyroxine 150 mcg daily.  Noted that TSH is slightly elevated and this could be adjusted if so desired  6. Myelopathy (HCC) This has been the result of cervical disc disease.  Hopefully now with his surgery he will get some relief of the symptoms which were primarily some weakness in the intrinsic muscles of his hands as well as gait disturbance  7. HYPERCHOLESTEROLEMIA Tolerating statin well.  Continue with rosuvastatin 5 mg a day   Family/ staff Communication:   Labs/tests ordered: none   M. , MD [facility] [address] [place], [place] [address] Office [phone]  

## 2020-12-07 ENCOUNTER — Encounter: Payer: Self-pay | Admitting: Family Medicine

## 2020-12-08 ENCOUNTER — Ambulatory Visit: Payer: Medicare HMO | Admitting: Cardiovascular Disease

## 2020-12-08 NOTE — Addendum Note (Signed)
Addended by: Wardell Honour on: 12/08/2020 12:12 PM   Modules accepted: Level of Service

## 2020-12-15 ENCOUNTER — Encounter: Payer: Self-pay | Admitting: Adult Health

## 2020-12-15 ENCOUNTER — Ambulatory Visit: Payer: Medicare HMO | Admitting: Adult Health

## 2020-12-16 DIAGNOSIS — R5381 Other malaise: Secondary | ICD-10-CM | POA: Diagnosis not present

## 2020-12-16 DIAGNOSIS — M4322 Fusion of spine, cervical region: Secondary | ICD-10-CM | POA: Diagnosis not present

## 2020-12-16 DIAGNOSIS — M4800 Spinal stenosis, site unspecified: Secondary | ICD-10-CM | POA: Diagnosis not present

## 2020-12-16 DIAGNOSIS — M4710 Other spondylosis with myelopathy, site unspecified: Secondary | ICD-10-CM | POA: Diagnosis not present

## 2020-12-21 DIAGNOSIS — G959 Disease of spinal cord, unspecified: Secondary | ICD-10-CM | POA: Diagnosis not present

## 2020-12-23 DIAGNOSIS — R5381 Other malaise: Secondary | ICD-10-CM | POA: Diagnosis not present

## 2020-12-23 DIAGNOSIS — I10 Essential (primary) hypertension: Secondary | ICD-10-CM | POA: Diagnosis not present

## 2020-12-23 DIAGNOSIS — E039 Hypothyroidism, unspecified: Secondary | ICD-10-CM | POA: Diagnosis not present

## 2020-12-23 DIAGNOSIS — E785 Hyperlipidemia, unspecified: Secondary | ICD-10-CM | POA: Diagnosis not present

## 2020-12-28 ENCOUNTER — Ambulatory Visit: Payer: Medicare HMO | Admitting: Family

## 2020-12-29 ENCOUNTER — Other Ambulatory Visit: Payer: Self-pay

## 2020-12-29 ENCOUNTER — Ambulatory Visit (INDEPENDENT_AMBULATORY_CARE_PROVIDER_SITE_OTHER): Payer: Medicare HMO | Admitting: Family

## 2020-12-29 VITALS — BP 121/65 | HR 76 | Temp 98.2°F | Resp 16 | Wt 150.0 lb

## 2020-12-29 DIAGNOSIS — M1A0720 Idiopathic chronic gout, left ankle and foot, without tophus (tophi): Secondary | ICD-10-CM

## 2020-12-29 DIAGNOSIS — R739 Hyperglycemia, unspecified: Secondary | ICD-10-CM | POA: Diagnosis not present

## 2020-12-29 DIAGNOSIS — I10 Essential (primary) hypertension: Secondary | ICD-10-CM | POA: Diagnosis not present

## 2020-12-29 DIAGNOSIS — E785 Hyperlipidemia, unspecified: Secondary | ICD-10-CM

## 2020-12-29 DIAGNOSIS — E039 Hypothyroidism, unspecified: Secondary | ICD-10-CM | POA: Diagnosis not present

## 2020-12-29 DIAGNOSIS — J449 Chronic obstructive pulmonary disease, unspecified: Secondary | ICD-10-CM | POA: Diagnosis not present

## 2020-12-29 DIAGNOSIS — G959 Disease of spinal cord, unspecified: Secondary | ICD-10-CM | POA: Diagnosis not present

## 2020-12-29 LAB — TSH: TSH: 0.46 u[IU]/mL (ref 0.35–4.50)

## 2020-12-29 LAB — HEMOGLOBIN A1C: Hgb A1c MFr Bld: 5.7 % (ref 4.6–6.5)

## 2020-12-29 NOTE — Progress Notes (Signed)
Subjective:   By signing my name below, I, [name], attest that this documentation has been prepared under the direction and in the presence of [name], NP. 12/29/2020   Patient ID: [name] Pratt, male    DOB: Sep 03, 1941, 80 y.o.   MRN: [id number]  No chief complaint on file.   HPI Patient is in today for a office visit. He reports he is having numbness issues after his posterior cervical decompression surgery. He notes improvement after SNF rehabilitation. He reports having issues with his bladder incontinence but is seeing his urologist to manage this issue. He reports that he has only taken albuterol 1-2 time since his prescription. He mentions that taking gabapentin during the day has been causing him dizziness through out the day. He now takes it at night before he sleeps.  Gout- He reports his feet has a burning sensation which worsens at night.  Hypertension- He is taking 5 mg amlodipine daily PO to manage his hypertension.   BP Readings from Last 3 Encounters:  12/29/20 121/65  12/06/20 106/62  12/03/20 120/65    Diabetes- He reports his blood glucose level has increased from baseline. Lab Results  Component Value Date   HGBA1C 5.3 09/29/2020    Past Medical History:  Diagnosis Date  . Aortic atherosclerosis ([place]) 03/05/2020  . Arthritis   . Cancer ([place])    skin cancer  . Carotid stenosis    a. Carotid U/S 8/09: LICA < 98%, RICA 33-82%;  b.  Carotid U/S 5/05:  RICA 39-76%; LICA 7-34%; f/u 1 year  . Chest pain     Myoview in 2008 was normal.  Echo 7/09: EF 60%, normal wall motion, mild LVH, mild LAE.  [name] Complication of anesthesia    "stomach does not wake up"  . COPD (chronic obstructive pulmonary disease) ([place])   . Emphysema, unspecified ([place]) 03/05/2020  . GERD (gastroesophageal reflux disease)   . Gout   . History of chicken pox   . History of hemorrhoids   . History of kidney stones   . Hyperlipidemia    under control  . Hypertension     under control  . Hypothyroidism   . Ileus ([place])   . Inguinal hernia   . OSA (obstructive sleep apnea)   . Pancreatitis   . PONV (postoperative nausea and vomiting)   . Rosacea   . 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    Past Surgical History:  Procedure Laterality Date  . ABDOMINAL HERNIA REPAIR  07/15/12   Dr [name]  . ANTERIOR CERVICAL DECOMP/DISCECTOMY FUSION N/A 07/09/2020   Procedure: ANTERIOR CERVICAL DECOMPRESSION AND FUSION CERVICAL THREE-FOUR.;  Surgeon: [name], MD;  Location: [facility];  Service: Neurosurgery;  Laterality: N/A;  anterior  . BROW LIFT  05/07/01  . CYSTOSCOPY WITH URETEROSCOPY AND STENT PLACEMENT Right 01/28/2014   Procedure: CYSTOSCOPY WITH URETEROSCOPY, BASKET RETRIVAL AND  STENT PLACEMENT;  Surgeon: [name], MD;  Location: [facility];  Service: Urology;  Laterality: Right;  . epidural injections     multiple procedures  . [place]   multiple times  . EYE SURGERY Bilateral 03/25/10, 2012   cataract removal  . HEMORRHOID SURGERY  2014  . HIATAL HERNIA REPAIR  12/21/93  . HOLMIUM LASER APPLICATION Right 1/93/7902   Procedure: HOLMIUM LASER APPLICATION;  Surgeon: [name], MD;  Location: [facility];  Service: Urology;  Laterality: Right;  .  INGUINAL HERNIA REPAIR Right 07/15/12   Dr [name], x2  . JOINT REPLACEMENT  07/25/04   right knee  . JOINT REPLACEMENT  07/13/10   left knee  . KNEE SURGERY  1995  . LAPAROSCOPIC CHOLECYSTECTOMY  09/20/06   with intraoperative cholangiogram and right inguinal herniorrhaphy with mesh   . LIGAMENT REPAIR Left 07/2013   shoulder  . LITHOTRIPSY     x2  . NASAL SEPTUM SURGERY  1975  . POSTERIOR CERVICAL FUSION/FORAMINOTOMY N/A 11/24/2020   Procedure: Posterior Cervical Laminectomy Cervical three-four, Cervical four-five with lateral mass fusion;  Surgeon: [name], MD;  Location: [facility];  Service: Neurosurgery;  Laterality: N/A;  . REFRACTIVE  SURGERY Bilateral   . 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 SKIN CANCER DESTRUCTION     nose, ear  . TONSILLECTOMY  as child  . TOTAL HIP ARTHROPLASTY Left   . URETHRAL DILATION  1991   [name]    Family History  Problem Relation Age of Onset  . Cancer Mother   . Hypertension Other   . Cancer Other   . Osteoarthritis Other     Social History   Socioeconomic History  . Marital status: Married    Spouse name: Not on file  . Number of children: 1  . Years of education: Not on file  . Highest education level: Not on file  Occupational History  . 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 Smoking status: Former Smoker    Years: 11.00    Types: Cigarettes    Quit date: 09/11/1978    Years since quitting: 42.3  . Smokeless tobacco: Never Used  Vaping Use  . Vaping Use: Never used  Substance and Sexual Activity  . Alcohol use: Not Currently    Alcohol/week: 0.0 standard drinks    Comment: rare  . Drug use: No  . Sexual activity: Not on file  Other Topics Concern  . Not on file  Social History Narrative   Lives with his wife   He has one son- lives locally.   2 grandchildren   Has worked for Research [employer], trade union   Enjoys wood working   Completed HS.  [employer]   Social Determinants of Radio broadcast assistant Strain: Not on file  Food Insecurity: Not on file  Transportation Needs: Not on file  Physical Activity: Not on file  Stress: Not on file  Social Connections: Not on file  Intimate Partner Violence: Not on file    Outpatient Medications Prior to Visit  Medication Sig Dispense Refill  . acetaminophen (TYLENOL) 650 MG CR tablet Take 650-1,300 mg by mouth See admin instructions. Take 1300 mg in the morning and 650 g at bedtime    . albuterol (VENTOLIN HFA) 108 (90 Base) MCG/ACT inhaler Inhale 2 puffs into the lungs every 6 (six) hours as needed for shortness of breath. 1 each 3  . Alcohol Swabs (ALCOHOL PADS) 70 % PADS 1  packet by Does not apply route 2 (two) times daily. (Patient not taking: Reported on 12/06/2020) 100 each 1  . allopurinol (ZYLOPRIM) 100 MG tablet TAKE 1 TABLET TWICE DAILY 180 tablet 1  . amLODipine (NORVASC) 5 MG tablet Take 1 tablet (5 mg total) by mouth daily. 90 tablet 1  . Artificial Tear Solution (SOOTHE XP) SOLN Place 1 drop into both eyes 2 (two) times daily as needed (Dry eyes).    [name] aspirin EC 81 MG  tablet Take 81 mg by mouth every morning.    . blood glucose meter kit and supplies Dispense based on patient and insurance preference. Use up to four times daily as directed. (FOR ICD-10 E10.9, E11.9). (Patient not taking: Reported on 12/06/2020) 1 each 0  . Cholecalciferol (VITAMIN D) 50 MCG (2000 UT) CAPS Take 2,000 Units by mouth daily.    . colchicine 0.6 MG tablet Take 1 tablet by mouth daily as needed (Gout).    . Cranberry 500 MG TABS Take 500 mg by mouth daily.     . cycloSPORINE (RESTASIS) 0.05 % ophthalmic emulsion Place 1 drop into both eyes 2 (two) times daily.    . diclofenac Sodium (VOLTAREN) 1 % GEL Apply 2 g topically daily as needed (Hand pain).    [name] docusate sodium (COLACE) 100 MG capsule Take 1 capsule (100 mg total) by mouth 2 (two) times daily as needed for mild constipation. 10 capsule 0  . gabapentin (NEURONTIN) 300 MG capsule Take 300 mg by mouth 2 (two) times daily as needed.    [name] glucose blood test strip TRUE Metrix Blood Glucose Test Strip, use as instructed (Patient not taking: Reported on 12/06/2020) 100 each 12  . Homeopathic Products (THERAWORX RELIEF) FOAM Apply 1 application topically 2 (two) times daily as needed (Pain).    . hydrALAZINE (APRESOLINE) 50 MG tablet Take 50 mg by mouth 2 (two) times daily.    . Lancets MISC 1 each by Does not apply route 2 (two) times daily. TRUE matrix lancets (Patient not taking: Reported on 12/06/2020) 100 each 3  . levothyroxine (SYNTHROID) 150 MCG tablet Give 150 mg by mouth on Monday through Saturday and on Sunday give 75 mg  a half tablet    . losartan (COZAAR) 100 MG tablet Take 1 tablet (100 mg total) by mouth daily. 90 tablet 1  . Menthol, Topical Analgesic, (ZIMS MAX-FREEZE EX) Apply 1 application topically daily as needed (pain).    . multivitamin (THERAGRAN) per tablet Take 1 tablet by mouth daily.    . nitroGLYCERIN (NITROSTAT) 0.4 MG SL tablet Place 1 tablet (0.4 mg total) under the tongue every 5 (five) minutes as needed for chest pain. 25 tablet 3  . Omega-3 Fatty Acids (FISH OIL) 1200 MG CAPS Take 1,200 mg by mouth daily.    . rosuvastatin (CRESTOR) 5 MG tablet TAKE 1 TABLET EVERY DAY 90 tablet 0  . sodium chloride (OCEAN) 0.65 % nasal spray Place 1 spray into both nostrils at bedtime as needed for congestion.     No facility-administered medications prior to visit.    Allergies  Allergen Reactions  . Pravastatin Other (See Comments)    Muscle cramps  . Sildenafil Other (See Comments)    Tachycardia    . Metoclopramide   . Atenolol Other (See Comments)    bradycardia  . Elastic Bandages & [Zinc] Other (See Comments)    Teflon bandages - Irritation  . Metoclopramide Hcl Anxiety    ROS     Objective:    Physical Exam Constitutional:      General: He is not in acute distress.    Appearance: He is well-developed.  HENT:     Head: Normocephalic and atraumatic.  Neck:     Comments: Vertical incision healing well with some mild erythema at the base. Cardiovascular:     Rate and Rhythm: Normal rate and regular rhythm.     Heart sounds: No murmur heard.   Pulmonary:  Effort: Pulmonary effort is normal. No respiratory distress.     Breath sounds: Normal breath sounds. No wheezing or rales.  Musculoskeletal:     Comments: 4-5/5 strength in RUE/LUE 5/5 strength RUE/LUE   Skin:    General: Skin is warm and dry.  Neurological:     Mental Status: He is alert and oriented to person, place, and time.  Psychiatric:        Behavior: Behavior normal.        Thought Content: Thought  content normal.     There were no vitals taken for this visit. Wt Readings from Last 3 Encounters:  12/06/20 154 lb (69.9 kg)  11/24/20 154 lb (69.9 kg)  11/22/20 154 lb 4.8 oz (70 kg)    Diabetic Foot Exam - Simple   No data filed    Lab Results  Component Value Date   WBC 9.1 11/30/2020   HGB 12.6 (L) 11/30/2020   HCT 37.1 (L) 11/30/2020   PLT 385 11/30/2020   GLUCOSE 111 (H) 12/01/2020   CHOL 106 06/15/2020   TRIG 98 06/15/2020   HDL 38 (L) 06/15/2020   LDLCALC 50 06/15/2020   ALT 15 11/30/2020   AST 26 11/30/2020   NA 135 12/01/2020   K 3.8 12/01/2020   CL 100 12/01/2020   CREATININE 1.61 (H) 12/01/2020   BUN 29 (H) 12/01/2020   CO2 26 12/01/2020   TSH 5.516 (H) 11/30/2020   PSA 0.29 04/22/2018   INR 1.06 12/11/2012   HGBA1C 5.3 09/29/2020    Lab Results  Component Value Date   TSH 5.516 (H) 11/30/2020   Lab Results  Component Value Date   WBC 9.1 11/30/2020   HGB 12.6 (L) 11/30/2020   HCT 37.1 (L) 11/30/2020   MCV 95.4 11/30/2020   PLT 385 11/30/2020   Lab Results  Component Value Date   NA 135 12/01/2020   K 3.8 12/01/2020   CO2 26 12/01/2020   GLUCOSE 111 (H) 12/01/2020   BUN 29 (H) 12/01/2020   CREATININE 1.61 (H) 12/01/2020   BILITOT 0.9 11/30/2020   ALKPHOS 77 11/30/2020   AST 26 11/30/2020   ALT 15 11/30/2020   PROT 6.8 11/30/2020   ALBUMIN 3.2 (L) 11/30/2020   CALCIUM 8.9 12/01/2020   ANIONGAP 9 12/01/2020   GFR 55.40 (L) 09/29/2020   Lab Results  Component Value Date   CHOL 106 06/15/2020   Lab Results  Component Value Date   HDL 38 (L) 06/15/2020   Lab Results  Component Value Date   LDLCALC 50 06/15/2020   Lab Results  Component Value Date   TRIG 98 06/15/2020   Lab Results  Component Value Date   CHOLHDL 2.8 06/15/2020   Lab Results  Component Value Date   HGBA1C 5.3 09/29/2020       Assessment & Plan:   Problem List Items Addressed This Visit   None      No orders of the defined types were placed  in this encounter.   I, Shehryar [place], personally preformed the services described in this documentation.  All medical record entries made by the scribe were at my direction and in my presence.  I have reviewed the chart and discharge instructions (if applicable) and agree that the record reflects my personal performance and is accurate and complete. 12/29/2020   I,[name],acting as a scribe for Nance Pear, NP.,have documented all relevant documentation on the behalf of Nance Pear, NP,as directed by  [employer]  Rowe Pavy, NP while in the presence of Nance Pear, NP.    Shehryar [place], NP, have reviewed all documentation for this visit. The documentation on 12/30/20 for the exam, diagnosis, procedures, and orders are all accurate and complete.

## 2020-12-29 NOTE — Assessment & Plan Note (Signed)
Lab Results  Component Value Date   TSH 5.516 (H) 11/30/2020

## 2020-12-29 NOTE — Assessment & Plan Note (Signed)
Stable on allopurinol 100mg .  Prn colchicine.

## 2020-12-29 NOTE — Assessment & Plan Note (Signed)
Lab Results  Component Value Date   CHOL 106 06/15/2020   HDL 38 (L) 06/15/2020   LDLCALC 50 06/15/2020   TRIG 98 06/15/2020   CHOLHDL 2.8 06/15/2020   Tolerating crestor 5mg . LDL at goal. Continue same.

## 2020-12-29 NOTE — Assessment & Plan Note (Signed)
>>  ASSESSMENT AND PLAN FOR BENIGN ESSENTIAL HYPERTENSION WRITTEN ON 12/29/2020 10:56 AM BY O'SULLIVAN, Shyonna Carlin, NP  BP Readings from Last 3 Encounters:  12/29/20 121/65  12/06/20 106/62  12/03/20 120/65   BP stable. Continue hydralazine 50mg , amlodipine 5mg .

## 2020-12-29 NOTE — Assessment & Plan Note (Signed)
BP Readings from Last 3 Encounters:  12/29/20 121/65  12/06/20 106/62  12/03/20 120/65   BP stable. Continue hydralazine 50mg , amlodipine 5mg .

## 2020-12-30 NOTE — Assessment & Plan Note (Signed)
Still has RUE/RLE weakness as a result. He will continue PT/ST at home.

## 2021-01-01 DIAGNOSIS — I10 Essential (primary) hypertension: Secondary | ICD-10-CM | POA: Diagnosis not present

## 2021-01-01 DIAGNOSIS — Z4789 Encounter for other orthopedic aftercare: Secondary | ICD-10-CM | POA: Diagnosis not present

## 2021-01-01 DIAGNOSIS — J439 Emphysema, unspecified: Secondary | ICD-10-CM | POA: Diagnosis not present

## 2021-01-01 DIAGNOSIS — M109 Gout, unspecified: Secondary | ICD-10-CM | POA: Diagnosis not present

## 2021-01-01 DIAGNOSIS — M4802 Spinal stenosis, cervical region: Secondary | ICD-10-CM | POA: Diagnosis not present

## 2021-01-01 DIAGNOSIS — M4712 Other spondylosis with myelopathy, cervical region: Secondary | ICD-10-CM | POA: Diagnosis not present

## 2021-01-01 DIAGNOSIS — M1990 Unspecified osteoarthritis, unspecified site: Secondary | ICD-10-CM | POA: Diagnosis not present

## 2021-01-01 DIAGNOSIS — I700 Atherosclerosis of aorta: Secondary | ICD-10-CM | POA: Diagnosis not present

## 2021-01-01 DIAGNOSIS — M5136 Other intervertebral disc degeneration, lumbar region: Secondary | ICD-10-CM | POA: Diagnosis not present

## 2021-01-03 DIAGNOSIS — G4733 Obstructive sleep apnea (adult) (pediatric): Secondary | ICD-10-CM | POA: Diagnosis not present

## 2021-01-04 ENCOUNTER — Other Ambulatory Visit: Payer: Self-pay | Admitting: Family

## 2021-01-04 DIAGNOSIS — M109 Gout, unspecified: Secondary | ICD-10-CM | POA: Diagnosis not present

## 2021-01-04 DIAGNOSIS — Z4789 Encounter for other orthopedic aftercare: Secondary | ICD-10-CM | POA: Diagnosis not present

## 2021-01-04 DIAGNOSIS — M5136 Other intervertebral disc degeneration, lumbar region: Secondary | ICD-10-CM | POA: Diagnosis not present

## 2021-01-04 DIAGNOSIS — I10 Essential (primary) hypertension: Secondary | ICD-10-CM | POA: Diagnosis not present

## 2021-01-04 DIAGNOSIS — I700 Atherosclerosis of aorta: Secondary | ICD-10-CM | POA: Diagnosis not present

## 2021-01-04 DIAGNOSIS — M4802 Spinal stenosis, cervical region: Secondary | ICD-10-CM | POA: Diagnosis not present

## 2021-01-04 DIAGNOSIS — J439 Emphysema, unspecified: Secondary | ICD-10-CM | POA: Diagnosis not present

## 2021-01-04 DIAGNOSIS — M1990 Unspecified osteoarthritis, unspecified site: Secondary | ICD-10-CM | POA: Diagnosis not present

## 2021-01-04 DIAGNOSIS — M4712 Other spondylosis with myelopathy, cervical region: Secondary | ICD-10-CM | POA: Diagnosis not present

## 2021-01-05 ENCOUNTER — Telehealth: Payer: Self-pay | Admitting: Family

## 2021-01-05 DIAGNOSIS — L309 Dermatitis, unspecified: Secondary | ICD-10-CM | POA: Diagnosis not present

## 2021-01-05 DIAGNOSIS — Z85828 Personal history of other malignant neoplasm of skin: Secondary | ICD-10-CM | POA: Diagnosis not present

## 2021-01-05 NOTE — Telephone Encounter (Signed)
Patient would like a return call in reference to blood pressure medication, patient wants to make sure he bp does go to low.  Patient would like to make adjustments to medication   Please call back

## 2021-01-06 ENCOUNTER — Other Ambulatory Visit: Payer: Self-pay | Admitting: Family

## 2021-01-06 DIAGNOSIS — M1990 Unspecified osteoarthritis, unspecified site: Secondary | ICD-10-CM | POA: Diagnosis not present

## 2021-01-06 DIAGNOSIS — Z4789 Encounter for other orthopedic aftercare: Secondary | ICD-10-CM | POA: Diagnosis not present

## 2021-01-06 DIAGNOSIS — M109 Gout, unspecified: Secondary | ICD-10-CM | POA: Diagnosis not present

## 2021-01-06 DIAGNOSIS — M4712 Other spondylosis with myelopathy, cervical region: Secondary | ICD-10-CM | POA: Diagnosis not present

## 2021-01-06 DIAGNOSIS — J439 Emphysema, unspecified: Secondary | ICD-10-CM | POA: Diagnosis not present

## 2021-01-06 DIAGNOSIS — M5136 Other intervertebral disc degeneration, lumbar region: Secondary | ICD-10-CM | POA: Diagnosis not present

## 2021-01-06 DIAGNOSIS — I700 Atherosclerosis of aorta: Secondary | ICD-10-CM | POA: Diagnosis not present

## 2021-01-06 DIAGNOSIS — M4802 Spinal stenosis, cervical region: Secondary | ICD-10-CM | POA: Diagnosis not present

## 2021-01-06 DIAGNOSIS — I10 Essential (primary) hypertension: Secondary | ICD-10-CM | POA: Diagnosis not present

## 2021-01-06 NOTE — Telephone Encounter (Signed)
Patient just had questions about his medications and ask if was anything changed.  [facility] had some ready for him.  Advised that nothing has changed.

## 2021-01-07 DIAGNOSIS — M4712 Other spondylosis with myelopathy, cervical region: Secondary | ICD-10-CM | POA: Diagnosis not present

## 2021-01-07 DIAGNOSIS — I10 Essential (primary) hypertension: Secondary | ICD-10-CM | POA: Diagnosis not present

## 2021-01-07 DIAGNOSIS — J439 Emphysema, unspecified: Secondary | ICD-10-CM | POA: Diagnosis not present

## 2021-01-07 DIAGNOSIS — M5136 Other intervertebral disc degeneration, lumbar region: Secondary | ICD-10-CM | POA: Diagnosis not present

## 2021-01-07 DIAGNOSIS — M109 Gout, unspecified: Secondary | ICD-10-CM | POA: Diagnosis not present

## 2021-01-07 DIAGNOSIS — Z4789 Encounter for other orthopedic aftercare: Secondary | ICD-10-CM | POA: Diagnosis not present

## 2021-01-07 DIAGNOSIS — M1990 Unspecified osteoarthritis, unspecified site: Secondary | ICD-10-CM | POA: Diagnosis not present

## 2021-01-07 DIAGNOSIS — I700 Atherosclerosis of aorta: Secondary | ICD-10-CM | POA: Diagnosis not present

## 2021-01-07 DIAGNOSIS — M4802 Spinal stenosis, cervical region: Secondary | ICD-10-CM | POA: Diagnosis not present

## 2021-01-10 ENCOUNTER — Telehealth: Payer: Self-pay | Admitting: Family

## 2021-01-10 NOTE — Telephone Encounter (Signed)
Medication:  rosuvastatin (CRESTOR) 5 MG tablet [[id number]]   Has the patient contacted their pharmacy? Yes.   (If no, request that the patient contact the pharmacy for the refill.) (If yes, when and what did the pharmacy advise?)  Preferred Pharmacy (with phone number or street name):  [facility]  [place], [place] [address]  Phone:  [phone] Fax:  [phone]  DEA #:  [id number]  [id number] Reason: --     Agent: Please be advised that RX refills may take up to 3 business days. We ask that you follow-up with your pharmacy.

## 2021-01-11 ENCOUNTER — Other Ambulatory Visit: Payer: Self-pay

## 2021-01-11 MED ORDER — ROSUVASTATIN CALCIUM 5 MG PO TABS: 5.0000 mg | ORAL_TABLET | Freq: Every day | ORAL | 0 refills | Status: DC

## 2021-01-13 ENCOUNTER — Encounter: Payer: Self-pay | Admitting: Adult Health

## 2021-01-13 ENCOUNTER — Ambulatory Visit: Payer: Medicare HMO | Admitting: Adult Health

## 2021-01-13 ENCOUNTER — Telehealth: Payer: Self-pay | Admitting: Family

## 2021-01-13 ENCOUNTER — Other Ambulatory Visit: Payer: Self-pay

## 2021-01-13 VITALS — BP 170/80 | HR 66 | Ht 66.0 in | Wt 150.0 lb

## 2021-01-13 DIAGNOSIS — Z9989 Dependence on other enabling machines and devices: Secondary | ICD-10-CM | POA: Diagnosis not present

## 2021-01-13 DIAGNOSIS — N3941 Urge incontinence: Secondary | ICD-10-CM | POA: Diagnosis not present

## 2021-01-13 DIAGNOSIS — G4733 Obstructive sleep apnea (adult) (pediatric): Secondary | ICD-10-CM | POA: Diagnosis not present

## 2021-01-13 DIAGNOSIS — R3915 Urgency of urination: Secondary | ICD-10-CM | POA: Diagnosis not present

## 2021-01-13 MED ORDER — LOSARTAN POTASSIUM 100 MG PO TABS: 100.0000 mg | ORAL_TABLET | Freq: Every day | ORAL | 1 refills | Status: DC

## 2021-01-13 NOTE — Patient Instructions (Signed)
Continue using CPAP nightly and greater than 4 hours each night Increased pressure 8-15 If your symptoms worsen or you develop new symptoms please let us know.

## 2021-01-13 NOTE — Progress Notes (Signed)
PATIENT: [name] Pratt DOB: May 08, 1941  REASON FOR VISIT: follow up HISTORY FROM: patient  HISTORY OF PRESENT ILLNESS: Today 01/13/21:  [name] Pratt is an 80 year old male with a history of obstructive sleep apnea on CPAP.  He returns today for follow-up.  He reports that since his last visit he has had 2 neck surgeries.  He reports during that time he found it difficult to use his CPAP.  He denies any new issues with the CPAP.  He returns today for an evaluation.  HISTORY:  [name] Pratt is a 80 year old male with a history of obstructive sleep apnea on CPAP.  His download indicates that he uses machine nightly for compliance of 100%.  He uses machine greater than 4 hours each night.  On average he uses his machine 7 hours and 32 minutes.  His residual AHI is 3.8 on 8 to 14 cm of water with EPR of 2.  He does not have a significant leak.  He states that the CPAP is working better for him.  He states that the new machine is much better.  He denies any new issues.  He returns today for an evaluation.  REVIEW OF SYSTEMS: Out of a complete 14 system review of symptoms, the patient complains only of the following symptoms, and all other reviewed systems are negative.    ALLERGIES: Allergies  Allergen Reactions  . Pravastatin Other (See Comments)    Muscle cramps  . Sildenafil Other (See Comments)    Tachycardia    . Metoclopramide   . Oxycodone     hallucinations  . Atenolol Other (See Comments)    bradycardia  . Elastic Bandages & [Zinc] Other (See Comments)    Teflon bandages - Irritation  . Metoclopramide Hcl Anxiety    HOME MEDICATIONS: Outpatient Medications Prior to Visit  Medication Sig Dispense Refill  . acetaminophen (TYLENOL) 650 MG CR tablet Take 650-1,300 mg by mouth See admin instructions. Take 1300 mg in the morning and 650 g at bedtime    . albuterol (VENTOLIN HFA) 108 (90 Base) MCG/ACT inhaler Inhale 2 puffs into the lungs every 6 (six) hours as needed for  shortness of breath. 1 each 3  . Alcohol Swabs (ALCOHOL PADS) 70 % PADS 1 packet by Does not apply route 2 (two) times daily. 100 each 1  . allopurinol (ZYLOPRIM) 100 MG tablet TAKE 1 TABLET TWICE DAILY 180 tablet 1  . amLODipine (NORVASC) 5 MG tablet TAKE 1 TABLET EVERY DAY 90 tablet 1  . Artificial Tear Solution (SOOTHE XP) SOLN Place 1 drop into both eyes 2 (two) times daily as needed (Dry eyes).    [name] aspirin EC 81 MG tablet Take 81 mg by mouth every morning.    . blood glucose meter kit and supplies Dispense based on patient and insurance preference. Use up to four times daily as directed. (FOR ICD-10 E10.9, E11.9). 1 each 0  . Cholecalciferol (VITAMIN D) 50 MCG (2000 UT) CAPS Take 2,000 Units by mouth daily.    . colchicine 0.6 MG tablet Take 1 tablet by mouth daily as needed (Gout).    . Cranberry 500 MG TABS Take 500 mg by mouth daily.     . cycloSPORINE (RESTASIS) 0.05 % ophthalmic emulsion Place 1 drop into both eyes 2 (two) times daily.    . diclofenac Sodium (VOLTAREN) 1 % GEL Apply 2 g topically daily as needed (Hand pain).    [name] docusate sodium (COLACE) 100 MG capsule Take 1  capsule (100 mg total) by mouth 2 (two) times daily as needed for mild constipation. 10 capsule 0  . gabapentin (NEURONTIN) 300 MG capsule Take 300 mg by mouth 2 (two) times daily as needed.    [name] glucose blood test strip TRUE Metrix Blood Glucose Test Strip, use as instructed 100 each 12  . Homeopathic Products (THERAWORX RELIEF) FOAM Apply 1 application topically 2 (two) times daily as needed (Pain).    . hydrALAZINE (APRESOLINE) 50 MG tablet Take 50 mg by mouth 2 (two) times daily.    . Lancets MISC 1 each by Does not apply route 2 (two) times daily. TRUE matrix lancets 100 each 3  . levothyroxine (SYNTHROID) 150 MCG tablet Give 150 mg by mouth on Monday through Saturday and on Sunday give 75 mg a half tablet    . losartan (COZAAR) 100 MG tablet Take 1 tablet (100 mg total) by mouth daily. 90 tablet 1  .  Menthol, Topical Analgesic, (ZIMS MAX-FREEZE EX) Apply 1 application topically daily as needed (pain).    . multivitamin (THERAGRAN) per tablet Take 1 tablet by mouth daily.    . nitroGLYCERIN (NITROSTAT) 0.4 MG SL tablet Place 1 tablet (0.4 mg total) under the tongue every 5 (five) minutes as needed for chest pain. 25 tablet 3  . Omega-3 Fatty Acids (FISH OIL) 1200 MG CAPS Take 1,200 mg by mouth daily.    . rosuvastatin (CRESTOR) 5 MG tablet Take 1 tablet (5 mg total) by mouth daily. 90 tablet 0  . sodium chloride (OCEAN) 0.65 % nasal spray Place 1 spray into both nostrils at bedtime as needed for congestion.     No facility-administered medications prior to visit.    PAST MEDICAL HISTORY: Past Medical History:  Diagnosis Date  . Aortic atherosclerosis ([place]) 03/05/2020  . Arthritis   . Cancer ([place])    skin cancer  . Carotid stenosis    a. Carotid U/S 6/38: LICA < 75%, RICA 64-33%;  b.  Carotid U/S 2/95:  RICA 18-84%; LICA 1-66%; f/u 1 year  . Chest pain     Myoview in 2008 was normal.  Echo 7/09: EF 60%, normal wall motion, mild LVH, mild LAE.  [name] Complication of anesthesia    "stomach does not wake up"  . COPD (chronic obstructive pulmonary disease) ([place])   . Emphysema, unspecified ([place]) 03/05/2020  . GERD (gastroesophageal reflux disease)   . Gout   . History of chicken pox   . History of hemorrhoids   . History of kidney stones   . Hyperlipidemia    under control  . Hypertension    under control  . Hypothyroidism   . Ileus ([place])   . Inguinal hernia   . OSA (obstructive sleep apnea)   . Pancreatitis   . PONV (postoperative nausea and vomiting)   . Rosacea   . 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    PAST SURGICAL HISTORY: Past Surgical History:  Procedure Laterality Date  . ABDOMINAL HERNIA REPAIR  07/15/12   Dr [name]  . ANTERIOR CERVICAL DECOMP/DISCECTOMY FUSION N/A 07/09/2020   Procedure: ANTERIOR CERVICAL DECOMPRESSION  AND FUSION CERVICAL THREE-FOUR.;  Surgeon: [name], MD;  Location: [facility];  Service: Neurosurgery;  Laterality: N/A;  anterior  . BROW LIFT  05/07/01  . CYSTOSCOPY WITH URETEROSCOPY AND STENT PLACEMENT Right 01/28/2014   Procedure: CYSTOSCOPY WITH URETEROSCOPY, BASKET RETRIVAL AND  STENT PLACEMENT;  Surgeon: [name], MD;  Location: [facility];  Service: Urology;  Laterality: Right;  . epidural injections     multiple procedures  . [place]   multiple times  . EYE SURGERY Bilateral 03/25/10, 2012   cataract removal  . HEMORRHOID SURGERY  2014  . HIATAL HERNIA REPAIR  12/21/93  . HOLMIUM LASER APPLICATION Right 8/76/8115   Procedure: HOLMIUM LASER APPLICATION;  Surgeon: [name], MD;  Location: [facility];  Service: Urology;  Laterality: Right;  . INGUINAL HERNIA REPAIR Right 07/15/12   Dr [name], x2  . JOINT REPLACEMENT  07/25/04   right knee  . JOINT REPLACEMENT  07/13/10   left knee  . KNEE SURGERY  1995  . LAPAROSCOPIC CHOLECYSTECTOMY  09/20/06   with intraoperative cholangiogram and right inguinal herniorrhaphy with mesh   . LIGAMENT REPAIR Left 07/2013   shoulder  . LITHOTRIPSY     x2  . NASAL SEPTUM SURGERY  1975  . POSTERIOR CERVICAL FUSION/FORAMINOTOMY N/A 11/24/2020   Procedure: Posterior Cervical Laminectomy Cervical three-four, Cervical four-five with lateral mass fusion;  Surgeon: [name], MD;  Location: [facility];  Service: Neurosurgery;  Laterality: N/A;  . REFRACTIVE SURGERY Bilateral   . 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 SKIN CANCER DESTRUCTION     nose, ear  . TONSILLECTOMY  as child  . TOTAL HIP ARTHROPLASTY Left   . URETHRAL DILATION  1991   [name]    FAMILY HISTORY: Family History  Problem Relation Age of Onset  . Cancer Mother   . Hypertension Other   . Cancer Other   . Osteoarthritis Other     SOCIAL HISTORY: Social History   Socioeconomic History  . Marital status: Married    Spouse name: Not on file  . Number  of children: 1  . Years of education: Not on file  . Highest education level: Not on file  Occupational History  . 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 Smoking status: Former Smoker    Years: 11.00    Types: Cigarettes    Quit date: 09/11/1978    Years since quitting: 42.3  . Smokeless tobacco: Never Used  Vaping Use  . Vaping Use: Never used  Substance and Sexual Activity  . Alcohol use: Not Currently    Alcohol/week: 0.0 standard drinks    Comment: rare  . Drug use: No  . Sexual activity: Not on file  Other Topics Concern  . Not on file  Social History Narrative   Lives with his wife   He has one son- lives locally.   2 grandchildren   Has worked for Research officer, trade union   Enjoys wood working   Completed HS.  [employer]   Social Determinants of Radio broadcast assistant Strain: Not on file  Food Insecurity: Not on file  Transportation Needs: Not on file  Physical Activity: Not on file  Stress: Not on file  Social Connections: Not on file  Intimate Partner Violence: Not on file      PHYSICAL EXAM  Vitals:   01/13/21 0925  BP: (!) 170/80  Pulse: 66  Weight: 150 lb (68 kg)  Height: '5\' 6"'  (1.676 m)   Body mass index is 24.21 kg/m.  Generalized: Well developed, in no acute distress  Chest: Lungs clear to auscultation bilaterally  Neurological examination  Mentation: Alert oriented to time, place, history taking. Follows all commands speech and language fluent Cranial  nerve II-XII: Extraocular movements were full, visual field were full on confrontational test Head turning and shoulder shrug  were normal and symmetric. Motor: The motor testing reveals 5 over 5 strength of all 4 extremities. Good symmetric motor tone is noted throughout.  Sensory: Sensory testing is intact to soft touch on all 4 extremities. No evidence of extinction is noted.  Gait and station: Gait is normal.    DIAGNOSTIC DATA (LABS, IMAGING, TESTING) -  I reviewed patient records, labs, notes, testing and imaging myself where available.  Lab Results  Component Value Date   WBC 9.1 11/30/2020   HGB 12.6 (L) 11/30/2020   HCT 37.1 (L) 11/30/2020   MCV 95.4 11/30/2020   PLT 385 11/30/2020      Component Value Date/Time   NA 135 12/01/2020 1601   K 3.8 12/01/2020 1601   CL 100 12/01/2020 1601   CO2 26 12/01/2020 1601   GLUCOSE 111 (H) 12/01/2020 1601   BUN 29 (H) 12/01/2020 1601   CREATININE 1.61 (H) 12/01/2020 1601   CREATININE 1.74 (H) 06/29/2020 1023   CALCIUM 8.9 12/01/2020 1601   PROT 6.8 11/30/2020 1604   ALBUMIN 3.2 (L) 11/30/2020 1604   AST 26 11/30/2020 1604   ALT 15 11/30/2020 1604   ALKPHOS 77 11/30/2020 1604   BILITOT 0.9 11/30/2020 1604   GFRNONAA 43 (L) 12/01/2020 1601   GFRNONAA 73 11/25/2012 1103   GFRAA 51 (L) 03/05/2020 0334   GFRAA 84 11/25/2012 1103   Lab Results  Component Value Date   CHOL 106 06/15/2020   HDL 38 (L) 06/15/2020   LDLCALC 50 06/15/2020   TRIG 98 06/15/2020   CHOLHDL 2.8 06/15/2020   Lab Results  Component Value Date   HGBA1C 5.7 12/29/2020   Lab Results  Component Value Date   VITAMINB12 605 03/04/2020   Lab Results  Component Value Date   TSH 0.46 12/29/2020      ASSESSMENT AND PLAN 80 y.o. 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 here with:  1. OSA on CPAP  - CPAP compliance excellent -Residual AHI  slightly elevated.  His pressure was increased 8-15 - Encourage patient to use CPAP nightly and > 4 hours each night - F/U in 1 year or sooner if needed     Ward Givens, MSN, NP-C 01/13/2021, 9:32 AM [facility] [address] [place], [place] [address] [phone]

## 2021-01-13 NOTE — Telephone Encounter (Signed)
Medication: losartan (COZAAR) 100 MG tablet    Has the patient contacted their pharmacy? No. (If no, request that the patient contact the pharmacy for the refill.) (If yes, when and what did the pharmacy advise?)  Preferred Pharmacy (with phone number or street name):  [facility] [place] Phone:  [phone]  Fax:  [phone]       Agent: Please be advised that RX refills may take up to 3 business days. We ask that you follow-up with your pharmacy.

## 2021-01-13 NOTE — Telephone Encounter (Signed)
Refill sent to [facility] mail order as requested

## 2021-01-14 DIAGNOSIS — I10 Essential (primary) hypertension: Secondary | ICD-10-CM | POA: Diagnosis not present

## 2021-01-14 DIAGNOSIS — M4712 Other spondylosis with myelopathy, cervical region: Secondary | ICD-10-CM | POA: Diagnosis not present

## 2021-01-14 DIAGNOSIS — J439 Emphysema, unspecified: Secondary | ICD-10-CM | POA: Diagnosis not present

## 2021-01-14 DIAGNOSIS — Z4789 Encounter for other orthopedic aftercare: Secondary | ICD-10-CM | POA: Diagnosis not present

## 2021-01-14 DIAGNOSIS — M109 Gout, unspecified: Secondary | ICD-10-CM | POA: Diagnosis not present

## 2021-01-14 DIAGNOSIS — M1990 Unspecified osteoarthritis, unspecified site: Secondary | ICD-10-CM | POA: Diagnosis not present

## 2021-01-14 DIAGNOSIS — M4802 Spinal stenosis, cervical region: Secondary | ICD-10-CM | POA: Diagnosis not present

## 2021-01-14 DIAGNOSIS — I700 Atherosclerosis of aorta: Secondary | ICD-10-CM | POA: Diagnosis not present

## 2021-01-14 DIAGNOSIS — M5136 Other intervertebral disc degeneration, lumbar region: Secondary | ICD-10-CM | POA: Diagnosis not present

## 2021-01-18 DIAGNOSIS — I10 Essential (primary) hypertension: Secondary | ICD-10-CM | POA: Diagnosis not present

## 2021-01-18 DIAGNOSIS — M1990 Unspecified osteoarthritis, unspecified site: Secondary | ICD-10-CM | POA: Diagnosis not present

## 2021-01-18 DIAGNOSIS — G959 Disease of spinal cord, unspecified: Secondary | ICD-10-CM | POA: Diagnosis not present

## 2021-01-18 DIAGNOSIS — J439 Emphysema, unspecified: Secondary | ICD-10-CM | POA: Diagnosis not present

## 2021-01-18 DIAGNOSIS — Z4789 Encounter for other orthopedic aftercare: Secondary | ICD-10-CM | POA: Diagnosis not present

## 2021-01-18 DIAGNOSIS — M4802 Spinal stenosis, cervical region: Secondary | ICD-10-CM | POA: Diagnosis not present

## 2021-01-18 DIAGNOSIS — M109 Gout, unspecified: Secondary | ICD-10-CM | POA: Diagnosis not present

## 2021-01-18 DIAGNOSIS — I700 Atherosclerosis of aorta: Secondary | ICD-10-CM | POA: Diagnosis not present

## 2021-01-18 DIAGNOSIS — M47816 Spondylosis without myelopathy or radiculopathy, lumbar region: Secondary | ICD-10-CM | POA: Diagnosis not present

## 2021-01-18 DIAGNOSIS — M4712 Other spondylosis with myelopathy, cervical region: Secondary | ICD-10-CM | POA: Diagnosis not present

## 2021-01-18 DIAGNOSIS — M5136 Other intervertebral disc degeneration, lumbar region: Secondary | ICD-10-CM | POA: Diagnosis not present

## 2021-01-20 DIAGNOSIS — N3941 Urge incontinence: Secondary | ICD-10-CM | POA: Diagnosis not present

## 2021-01-20 DIAGNOSIS — R3915 Urgency of urination: Secondary | ICD-10-CM | POA: Diagnosis not present

## 2021-01-21 DIAGNOSIS — Z4789 Encounter for other orthopedic aftercare: Secondary | ICD-10-CM | POA: Diagnosis not present

## 2021-01-21 DIAGNOSIS — M109 Gout, unspecified: Secondary | ICD-10-CM | POA: Diagnosis not present

## 2021-01-21 DIAGNOSIS — M1990 Unspecified osteoarthritis, unspecified site: Secondary | ICD-10-CM | POA: Diagnosis not present

## 2021-01-21 DIAGNOSIS — I10 Essential (primary) hypertension: Secondary | ICD-10-CM | POA: Diagnosis not present

## 2021-01-21 DIAGNOSIS — I700 Atherosclerosis of aorta: Secondary | ICD-10-CM | POA: Diagnosis not present

## 2021-01-21 DIAGNOSIS — M4712 Other spondylosis with myelopathy, cervical region: Secondary | ICD-10-CM | POA: Diagnosis not present

## 2021-01-21 DIAGNOSIS — J439 Emphysema, unspecified: Secondary | ICD-10-CM | POA: Diagnosis not present

## 2021-01-21 DIAGNOSIS — M4802 Spinal stenosis, cervical region: Secondary | ICD-10-CM | POA: Diagnosis not present

## 2021-01-21 DIAGNOSIS — M5136 Other intervertebral disc degeneration, lumbar region: Secondary | ICD-10-CM | POA: Diagnosis not present

## 2021-01-25 ENCOUNTER — Encounter (INDEPENDENT_AMBULATORY_CARE_PROVIDER_SITE_OTHER): Payer: Medicare HMO | Admitting: Ophthalmology

## 2021-01-25 DIAGNOSIS — Z4789 Encounter for other orthopedic aftercare: Secondary | ICD-10-CM | POA: Diagnosis not present

## 2021-01-25 DIAGNOSIS — M1990 Unspecified osteoarthritis, unspecified site: Secondary | ICD-10-CM | POA: Diagnosis not present

## 2021-01-25 DIAGNOSIS — M4802 Spinal stenosis, cervical region: Secondary | ICD-10-CM | POA: Diagnosis not present

## 2021-01-25 DIAGNOSIS — I10 Essential (primary) hypertension: Secondary | ICD-10-CM | POA: Diagnosis not present

## 2021-01-25 DIAGNOSIS — J439 Emphysema, unspecified: Secondary | ICD-10-CM | POA: Diagnosis not present

## 2021-01-25 DIAGNOSIS — I700 Atherosclerosis of aorta: Secondary | ICD-10-CM | POA: Diagnosis not present

## 2021-01-25 DIAGNOSIS — M109 Gout, unspecified: Secondary | ICD-10-CM | POA: Diagnosis not present

## 2021-01-25 DIAGNOSIS — M4712 Other spondylosis with myelopathy, cervical region: Secondary | ICD-10-CM | POA: Diagnosis not present

## 2021-01-25 DIAGNOSIS — M5136 Other intervertebral disc degeneration, lumbar region: Secondary | ICD-10-CM | POA: Diagnosis not present

## 2021-01-27 DIAGNOSIS — N3941 Urge incontinence: Secondary | ICD-10-CM | POA: Diagnosis not present

## 2021-01-28 DIAGNOSIS — M47816 Spondylosis without myelopathy or radiculopathy, lumbar region: Secondary | ICD-10-CM | POA: Diagnosis not present

## 2021-02-02 DIAGNOSIS — M1990 Unspecified osteoarthritis, unspecified site: Secondary | ICD-10-CM | POA: Diagnosis not present

## 2021-02-02 DIAGNOSIS — M109 Gout, unspecified: Secondary | ICD-10-CM | POA: Diagnosis not present

## 2021-02-02 DIAGNOSIS — I700 Atherosclerosis of aorta: Secondary | ICD-10-CM | POA: Diagnosis not present

## 2021-02-02 DIAGNOSIS — M4802 Spinal stenosis, cervical region: Secondary | ICD-10-CM | POA: Diagnosis not present

## 2021-02-02 DIAGNOSIS — I10 Essential (primary) hypertension: Secondary | ICD-10-CM | POA: Diagnosis not present

## 2021-02-02 DIAGNOSIS — M5136 Other intervertebral disc degeneration, lumbar region: Secondary | ICD-10-CM | POA: Diagnosis not present

## 2021-02-02 DIAGNOSIS — Z4789 Encounter for other orthopedic aftercare: Secondary | ICD-10-CM | POA: Diagnosis not present

## 2021-02-02 DIAGNOSIS — J439 Emphysema, unspecified: Secondary | ICD-10-CM | POA: Diagnosis not present

## 2021-02-02 DIAGNOSIS — M4712 Other spondylosis with myelopathy, cervical region: Secondary | ICD-10-CM | POA: Diagnosis not present

## 2021-02-09 ENCOUNTER — Ambulatory Visit (INDEPENDENT_AMBULATORY_CARE_PROVIDER_SITE_OTHER): Payer: Medicare HMO | Admitting: Ophthalmology

## 2021-02-09 ENCOUNTER — Other Ambulatory Visit: Payer: Self-pay

## 2021-02-09 ENCOUNTER — Encounter (INDEPENDENT_AMBULATORY_CARE_PROVIDER_SITE_OTHER): Payer: Self-pay | Admitting: Ophthalmology

## 2021-02-09 DIAGNOSIS — H35073 Retinal telangiectasis, bilateral: Secondary | ICD-10-CM | POA: Diagnosis not present

## 2021-02-09 DIAGNOSIS — H353211 Exudative age-related macular degeneration, right eye, with active choroidal neovascularization: Secondary | ICD-10-CM

## 2021-02-09 DIAGNOSIS — H35353 Cystoid macular degeneration, bilateral: Secondary | ICD-10-CM | POA: Diagnosis not present

## 2021-02-09 HISTORY — DX: Exudative age-related macular degeneration, right eye, with active choroidal neovascularization: H35.3211

## 2021-02-09 MED ORDER — FLUORESCEIN SODIUM 10 % IV SOLN
500.0000 mg | INTRAVENOUS | Status: AC | PRN
  Administered 2021-02-09: 500 mg via INTRAVENOUS

## 2021-02-09 NOTE — Assessment & Plan Note (Signed)
Secondary condition from [facility]

## 2021-02-09 NOTE — Assessment & Plan Note (Signed)
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

## 2021-02-09 NOTE — Assessment & Plan Note (Signed)
I encourage the patient to continue with chances at improving mask fit as he has had recent weight loss which is led to mask leakage is Which can lead to ongoing ischemic change in the macula of each eye with nightly hypoxia should it develop with poor CPAP settings

## 2021-02-09 NOTE — Progress Notes (Signed)
02/09/2021     CHIEF COMPLAINT Patient presents for Retina Follow Up (6 Mo F/U OU//Pt denies noticeable changes to [place] OU since last visit. Pt denies ocular pain, flashes of light, or floaters OU. Pt sts he has been wearing CPAP every night, but sts "it leaks." Pt sts he keeps needing to readjust his straps on his CPAP machine.//)   HISTORY OF PRESENT ILLNESS: [name] Pratt is a 80 y.o. male who presents to the clinic today for:   HPI    Retina Follow Up    Diagnosis: Other   Laterality: both eyes   Onset: 6 months ago   Severity: moderate   Duration: 6 months   Course: stable   Comments: 6 Mo F/U OU  Pt denies noticeable changes to [place] OU since last visit. Pt denies ocular pain, flashes of light, or floaters OU. Pt sts he has been wearing CPAP every night, but sts "it leaks." Pt sts he keeps needing to readjust his straps on his CPAP machine.         Last edited by Rockie Neighbours, [place] on 02/09/2021 11:01 AM. (History)      Referring physician: [name], NP [address] [place],  [place] [address]  HISTORICAL INFORMATION:   Selected notes from the MEDICAL RECORD NUMBER    Lab Results  Component Value Date   HGBA1C 5.7 12/29/2020     CURRENT MEDICATIONS: Current Outpatient Medications (Ophthalmic Drugs)  Medication Sig  . Artificial Tear Solution (SOOTHE XP) SOLN Place 1 drop into both eyes 2 (two) times daily as needed (Dry eyes).  . cycloSPORINE (RESTASIS) 0.05 % ophthalmic emulsion Place 1 drop into both eyes 2 (two) times daily.   No current facility-administered medications for this visit. (Ophthalmic Drugs)   Current Outpatient Medications (Other)  Medication Sig  . acetaminophen (TYLENOL) 650 MG CR tablet Take 650-1,300 mg by mouth See admin instructions. Take 1300 mg in the morning and 650 g at bedtime  . albuterol (VENTOLIN HFA) 108 (90 Base) MCG/ACT inhaler Inhale 2 puffs into the lungs every 6 (six) hours as needed for  shortness of breath.  . Alcohol Swabs (ALCOHOL PADS) 70 % PADS 1 packet by Does not apply route 2 (two) times daily.  [name] allopurinol (ZYLOPRIM) 100 MG tablet TAKE 1 TABLET TWICE DAILY  . amLODipine (NORVASC) 5 MG tablet TAKE 1 TABLET EVERY DAY  . aspirin EC 81 MG tablet Take 81 mg by mouth every morning.  . blood glucose meter kit and supplies Dispense based on patient and insurance preference. Use up to four times daily as directed. (FOR ICD-10 E10.9, E11.9).  [name] Cholecalciferol (VITAMIN D) 50 MCG (2000 UT) CAPS Take 2,000 Units by mouth daily.  . colchicine 0.6 MG tablet Take 1 tablet by mouth daily as needed (Gout).  . Cranberry 500 MG TABS Take 500 mg by mouth daily.   . diclofenac Sodium (VOLTAREN) 1 % GEL Apply 2 g topically daily as needed (Hand pain).  [name] docusate sodium (COLACE) 100 MG capsule Take 1 capsule (100 mg total) by mouth 2 (two) times daily as needed for mild constipation.  . gabapentin (NEURONTIN) 300 MG capsule Take 300 mg by mouth 2 (two) times daily as needed.  [name] glucose blood test strip TRUE Metrix Blood Glucose Test Strip, use as instructed  . Homeopathic Products (THERAWORX RELIEF) FOAM Apply 1 application topically 2 (two) times daily as needed (Pain).  . hydrALAZINE (APRESOLINE) 50 MG tablet  Take 50 mg by mouth 2 (two) times daily.  . Lancets MISC 1 each by Does not apply route 2 (two) times daily. TRUE matrix lancets  . levothyroxine (SYNTHROID) 150 MCG tablet Give 150 mg by mouth on Monday through Saturday and on Sunday give 75 mg a half tablet  . losartan (COZAAR) 100 MG tablet Take 1 tablet (100 mg total) by mouth daily.  . Menthol, Topical Analgesic, (ZIMS MAX-FREEZE EX) Apply 1 application topically daily as needed (pain).  . multivitamin (THERAGRAN) per tablet Take 1 tablet by mouth daily.  . nitroGLYCERIN (NITROSTAT) 0.4 MG SL tablet Place 1 tablet (0.4 mg total) under the tongue every 5 (five) minutes as needed for chest pain.  . Omega-3 Fatty Acids (FISH OIL)  1200 MG CAPS Take 1,200 mg by mouth daily.  . rosuvastatin (CRESTOR) 5 MG tablet Take 1 tablet (5 mg total) by mouth daily.  . sodium chloride (OCEAN) 0.65 % nasal spray Place 1 spray into both nostrils at bedtime as needed for congestion.   No current facility-administered medications for this visit. (Other)      REVIEW OF SYSTEMS:    ALLERGIES Allergies  Allergen Reactions  . Pravastatin Other (See Comments)    Muscle cramps  . Sildenafil Other (See Comments)    Tachycardia    . Metoclopramide   . Oxycodone     hallucinations  . Atenolol Other (See Comments)    bradycardia  . Elastic Bandages & [Zinc] Other (See Comments)    Teflon bandages - Irritation  . Metoclopramide Hcl Anxiety    PAST MEDICAL HISTORY Past Medical History:  Diagnosis Date  . Aortic atherosclerosis ([place]) 03/05/2020  . Arthritis   . Cancer ([place])    skin cancer  . Carotid stenosis    a. Carotid U/S 1/69: LICA < 67%, RICA 89-38%;  b.  Carotid U/S 1/01:  RICA 75-10%; LICA 2-58%; f/u 1 year  . Chest pain     Myoview in 2008 was normal.  Echo 7/09: EF 60%, normal wall motion, mild LVH, mild LAE.  [name] Complication of anesthesia    "stomach does not wake up"  . COPD (chronic obstructive pulmonary disease) ([place])   . Emphysema, unspecified ([place]) 03/05/2020  . GERD (gastroesophageal reflux disease)   . Gout   . History of chicken pox   . History of hemorrhoids   . History of kidney stones   . Hyperlipidemia    under control  . Hypertension    under control  . Hypothyroidism   . Ileus ([place])   . Inguinal hernia   . OSA (obstructive sleep apnea)   . Pancreatitis   . PONV (postoperative nausea and vomiting)   . Rosacea   . 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   Past Surgical History:  Procedure Laterality Date  . ABDOMINAL HERNIA REPAIR  07/15/12   Dr [name]  . ANTERIOR CERVICAL DECOMP/DISCECTOMY FUSION N/A 07/09/2020   Procedure: ANTERIOR CERVICAL  DECOMPRESSION AND FUSION CERVICAL THREE-FOUR.;  Surgeon: [name], MD;  Location: [facility];  Service: Neurosurgery;  Laterality: N/A;  anterior  . BROW LIFT  05/07/01  . CYSTOSCOPY WITH URETEROSCOPY AND STENT PLACEMENT Right 01/28/2014   Procedure: CYSTOSCOPY WITH URETEROSCOPY, BASKET RETRIVAL AND  STENT PLACEMENT;  Surgeon: [name], MD;  Location: [facility];  Service: Urology;  Laterality: Right;  . epidural injections     multiple procedures  . [place] and  1994   multiple times  . EYE SURGERY Bilateral 03/25/10, 2012   cataract removal  . HEMORRHOID SURGERY  2014  . HIATAL HERNIA REPAIR  12/21/93  . HOLMIUM LASER APPLICATION Right 7/40/8144   Procedure: HOLMIUM LASER APPLICATION;  Surgeon: [name], MD;  Location: [facility];  Service: Urology;  Laterality: Right;  . INGUINAL HERNIA REPAIR Right 07/15/12   Dr [name], x2  . JOINT REPLACEMENT  07/25/04   right knee  . JOINT REPLACEMENT  07/13/10   left knee  . KNEE SURGERY  1995  . LAPAROSCOPIC CHOLECYSTECTOMY  09/20/06   with intraoperative cholangiogram and right inguinal herniorrhaphy with mesh   . LIGAMENT REPAIR Left 07/2013   shoulder  . LITHOTRIPSY     x2  . NASAL SEPTUM SURGERY  1975  . POSTERIOR CERVICAL FUSION/FORAMINOTOMY N/A 11/24/2020   Procedure: Posterior Cervical Laminectomy Cervical three-four, Cervical four-five with lateral mass fusion;  Surgeon: [name], MD;  Location: [facility];  Service: Neurosurgery;  Laterality: N/A;  . REFRACTIVE SURGERY Bilateral   . 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 SKIN CANCER DESTRUCTION     nose, ear  . TONSILLECTOMY  as child  . TOTAL HIP ARTHROPLASTY Left   . URETHRAL DILATION  1991   [name]    FAMILY HISTORY Family History  Problem Relation Age of Onset  . Cancer Mother   . Hypertension Other   . Cancer Other   . Osteoarthritis Other     SOCIAL HISTORY Social History   Tobacco Use  . Smoking status: Former Smoker    Years: 11.00    Types:  Cigarettes    Quit date: 09/11/1978    Years since quitting: 42.4  . Smokeless tobacco: Never Used  Vaping Use  . Vaping Use: Never used  Substance Use Topics  . Alcohol use: Not Currently    Alcohol/week: 0.0 standard drinks    Comment: rare  . Drug use: No         OPHTHALMIC EXAM:  Base Eye Exam    Visual Acuity (ETDRS)      Right Left   Dist [place] 20/100 +1 20/30 -1   Dist ph [place] 20/80ecc +1 NI       Tonometry (Tonopen, 11:05 AM)      Right Left   Pressure 10 12       Pupils      Pupils Dark Light Shape React APD   Right PERRL 1 1 Round Minimal None   Left PERRL 1 1 Round Minimal None       Visual Fields (Counting fingers)      Left Right    Full Full       Extraocular Movement      Right Left    Full Full       Neuro/Psych    Oriented x3: Yes   Mood/Affect: Normal       Dilation    Both eyes: 1.0% Mydriacyl, 2.5% Phenylephrine @ 11:05 AM        Slit Lamp and Fundus Exam    External Exam      Right Left   External Normal Normal       Slit Lamp Exam      Right Left   Lids/Lashes Normal Normal   Conjunctiva/Sclera White and quiet White and quiet   Cornea Clear Clear   Anterior Chamber Deep and quiet Deep and quiet   [place] PI PI   Lens Clear  Clear   Anterior Vitreous Normal Normal       Fundus Exam      Right Left   Posterior Vitreous Posterior vitreous detachment Posterior vitreous detachment   Disc Normal Normal   C/D Ratio 0.5 0.5   Macula No overt CME, angulated vessels temporally, with thickening along the p.m. bundle. No overt CME, angulated vessels temporally   Vessels Normal Normal   Periphery Normal Normal          IMAGING AND PROCEDURES  Imaging and Procedures for 02/09/21  OCT, Retina - OU - Both Eyes       Right Eye Quality was good. Central Foveal Thickness: 248. Progression has been stable. Findings include abnormal foveal contour, cystoid macular edema.   Left Eye Quality was good. Scan locations included  subfoveal. Central Foveal Thickness: 226. Progression has improved. Findings include abnormal foveal contour.   Notes Cavitary CME, chronic over years because of the macular telangiectasis present for longstanding with a permanent shunt vessels perifoveal  Yet OD has increasing sign of intraretinal fluid along the p.m. bundle nasally.  This could be RAP which has progressed since his last visit Of 07/28/2020.    OS, slightly less CME temporally as compared to 2019, will schedule fluorescein angiography next visit to study the macular perfusion       Color Fundus Photography Optos - OU - Both Eyes       Right Eye Progression has no prior data. Disc findings include normal observations. Macula : microaneurysms. Vessels : normal observations. Periphery : normal observations.   Left Eye Progression has no prior data. Vessels : normal observations. Periphery : normal observations.        Fluorescein Angiography Optos (Transit OS)       Injection:  500 mg Fluorescein Sodium 10 % injection   NDC: [phone]   Route: IntravenousRight Eye Mid/Late phase findings include choroidal neovascularization, leakage, microaneurysm. Choroidal neovascularization is occult, peripapillary.   Left Eye   Progression has been stable. Early phase findings include leakage, microaneurysm. Mid/Late phase findings include leakage, microaneurysm.   Notes OD with PERI papillary CNVM wrap like lesion on the nasal aspect of the foveal avascular zone confirmed enlarged on OCT from 6 months prior but also with encroachment near the center of the fovea.  This may be a complication also of the ischemic progression from MAC-TEL OD  Will need to commence with antivegF right eye for this new finding which is increasing while the leakage on the temporal aspect of the fovea has remained largely stable  OS with classic MAC-TEL on temporal aspect of the fovea no increase in size or leakage over the ensuing 3 years  from prior FFI                ASSESSMENT/PLAN:  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HCC) 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  H35.073 OCT, Retina - OU - Both Eyes    Color Fundus Photography Optos - OU - Both Eyes    Fluorescein Angiography Optos (Transit OS)    Fluorescein Sodium 10 % injection 500 mg  2. 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[place])  H35.3211   3. Cystoid macular edema of both eyes  H35.353     1.  Visual acuity decline OD, accompanied by increase in intraretinal fluid likely PERI papillary CNVM intraretinal, wrap like lesion.  We will commence with therapy promptly in the near future with intravitreal Avastin to look for susceptibility and sensitivity to antivegF therapy.  2.  Bilateral macular telangiectasis may have progressed since patient has had poor fit with his mask as he has lost weight.  I encouraged the patient to make sure that he is having all the possible benefits of CPAP use and preventing nightly hypoxic damage to the  3.  Ophthalmic Meds Ordered this visit:  Meds ordered this encounter  Medications  . Fluorescein Sodium 10 % injection 500 mg       Return in about 1 week (around 02/16/2021) for dilate, OD, AVASTIN OCT.  There are no Patient Instructions on file for this visit.   Explained the  diagnoses, plan, and follow up with the patient and they expressed understanding.  Patient expressed understanding of the importance of proper follow up care.   Clent Demark Keirstyn Aydt M.D. Diseases & Surgery of the Retina and Vitreous Retina & Diabetic [place] 02/09/21     Abbreviations: M myopia (nearsighted); A astigmatism; H hyperopia (farsighted); P presbyopia; Mrx spectacle prescription;  CTL contact lenses; OD right eye; OS left eye; OU both eyes  XT exotropia; ET esotropia; PEK punctate epithelial keratitis; PEE punctate epithelial erosions; DES dry eye syndrome; MGD meibomian gland dysfunction; ATs artificial tears; PFAT's preservative free artificial tears; [place] nuclear sclerotic cataract; PSC posterior subcapsular cataract; ERM epi-retinal membrane; PVD posterior vitreous detachment; RD retinal detachment; DM diabetes mellitus; DR diabetic retinopathy; NPDR non-proliferative diabetic retinopathy; PDR proliferative diabetic retinopathy; CSME clinically significant macular edema; DME diabetic macular edema; dbh dot blot hemorrhages; CWS cotton wool spot; POAG primary open angle glaucoma; C/D cup-to-disc ratio; HVF humphrey visual field; GVF goldmann visual field; OCT optical coherence tomography; IOP intraocular pressure; BRVO Branch retinal vein occlusion; CRVO central retinal vein occlusion; CRAO central retinal artery occlusion; BRAO branch retinal artery occlusion; RT retinal tear; SB scleral buckle; PPV pars plana vitrectomy; VH Vitreous hemorrhage; PRP panretinal laser photocoagulation; IVK intravitreal kenalog; VMT vitreomacular traction; MH Macular hole;  NVD neovascularization of the disc; NVE neovascularization elsewhere; AREDS age related eye disease study; ARMD age related macular degeneration; POAG primary open angle glaucoma; EBMD epithelial/anterior basement membrane dystrophy; ACIOL anterior chamber intraocular lens; IOL intraocular lens; PCIOL posterior chamber intraocular lens;  Phaco/IOL phacoemulsification with intraocular lens placement; [place] photorefractive keratectomy; LASIK laser assisted in situ keratomileusis; HTN hypertension; DM diabetes mellitus; COPD chronic obstructive pulmonary disease

## 2021-02-10 DIAGNOSIS — M5136 Other intervertebral disc degeneration, lumbar region: Secondary | ICD-10-CM | POA: Diagnosis not present

## 2021-02-10 DIAGNOSIS — R3915 Urgency of urination: Secondary | ICD-10-CM | POA: Diagnosis not present

## 2021-02-10 DIAGNOSIS — N3941 Urge incontinence: Secondary | ICD-10-CM | POA: Diagnosis not present

## 2021-02-10 DIAGNOSIS — M109 Gout, unspecified: Secondary | ICD-10-CM | POA: Diagnosis not present

## 2021-02-10 DIAGNOSIS — J439 Emphysema, unspecified: Secondary | ICD-10-CM | POA: Diagnosis not present

## 2021-02-10 DIAGNOSIS — M1990 Unspecified osteoarthritis, unspecified site: Secondary | ICD-10-CM | POA: Diagnosis not present

## 2021-02-10 DIAGNOSIS — I10 Essential (primary) hypertension: Secondary | ICD-10-CM | POA: Diagnosis not present

## 2021-02-10 DIAGNOSIS — I700 Atherosclerosis of aorta: Secondary | ICD-10-CM | POA: Diagnosis not present

## 2021-02-10 DIAGNOSIS — M4802 Spinal stenosis, cervical region: Secondary | ICD-10-CM | POA: Diagnosis not present

## 2021-02-10 DIAGNOSIS — Z4789 Encounter for other orthopedic aftercare: Secondary | ICD-10-CM | POA: Diagnosis not present

## 2021-02-10 DIAGNOSIS — M4712 Other spondylosis with myelopathy, cervical region: Secondary | ICD-10-CM | POA: Diagnosis not present

## 2021-02-16 DIAGNOSIS — G4733 Obstructive sleep apnea (adult) (pediatric): Secondary | ICD-10-CM | POA: Diagnosis not present

## 2021-02-17 ENCOUNTER — Other Ambulatory Visit: Payer: Self-pay

## 2021-02-17 ENCOUNTER — Ambulatory Visit (INDEPENDENT_AMBULATORY_CARE_PROVIDER_SITE_OTHER): Payer: Medicare HMO | Admitting: Ophthalmology

## 2021-02-17 ENCOUNTER — Encounter (INDEPENDENT_AMBULATORY_CARE_PROVIDER_SITE_OTHER): Payer: Self-pay | Admitting: Ophthalmology

## 2021-02-17 DIAGNOSIS — H353211 Exudative age-related macular degeneration, right eye, with active choroidal neovascularization: Secondary | ICD-10-CM

## 2021-02-17 MED ORDER — BEVACIZUMAB 2.5 MG/0.1ML IZ SOSY
2.5000 mg | PREFILLED_SYRINGE | INTRAVITREAL | Status: AC | PRN
  Administered 2021-02-17: 2.5 mg via INTRAVITREAL

## 2021-02-17 NOTE — Assessment & Plan Note (Signed)
  Active PERI papillary and in the p.m. bundle OD, need to commence with therapy OD not related to MAC-TEL

## 2021-02-17 NOTE — Progress Notes (Signed)
02/17/2021     CHIEF COMPLAINT Patient presents for Retina Follow Up (1 Wk F/U OD, poss Avastin OD//Pt denies noticeable changes to [place] OU since last visit. Pt denies ocular pain, flashes of light, or floaters OU. //)   HISTORY OF PRESENT ILLNESS: [name] Pratt is a 80 y.o. male who presents to the clinic today for:   HPI     Retina Follow Up           Diagnosis: Wet AMD   Laterality: right eye   Onset: 1 week ago   Severity: moderate   Duration: 1 week   Course: stable   Comments: 1 Wk F/U OD, poss Avastin OD  Pt denies noticeable changes to [place] OU since last visit. Pt denies ocular pain, flashes of light, or floaters OU.          Last edited by Rockie Neighbours, [place] on 02/17/2021  8:40 AM.      Referring physician: [name], NP [address] [place],  [place] [address]  HISTORICAL INFORMATION:   Selected notes from the MEDICAL RECORD NUMBER    Lab Results  Component Value Date   HGBA1C 5.7 12/29/2020     CURRENT MEDICATIONS: Current Outpatient Medications (Ophthalmic Drugs)  Medication Sig   Artificial Tear Solution (SOOTHE XP) SOLN Place 1 drop into both eyes 2 (two) times daily as needed (Dry eyes).   cycloSPORINE (RESTASIS) 0.05 % ophthalmic emulsion Place 1 drop into both eyes 2 (two) times daily.   No current facility-administered medications for this visit. (Ophthalmic Drugs)   Current Outpatient Medications (Other)  Medication Sig   acetaminophen (TYLENOL) 650 MG CR tablet Take 650-1,300 mg by mouth See admin instructions. Take 1300 mg in the morning and 650 g at bedtime   albuterol (VENTOLIN HFA) 108 (90 Base) MCG/ACT inhaler Inhale 2 puffs into the lungs every 6 (six) hours as needed for shortness of breath.   Alcohol Swabs (ALCOHOL PADS) 70 % PADS 1 packet by Does not apply route 2 (two) times daily.   allopurinol (ZYLOPRIM) 100 MG tablet TAKE 1 TABLET TWICE DAILY   amLODipine (NORVASC) 5 MG tablet TAKE 1 TABLET EVERY  DAY   aspirin EC 81 MG tablet Take 81 mg by mouth every morning.   blood glucose meter kit and supplies Dispense based on patient and insurance preference. Use up to four times daily as directed. (FOR ICD-10 E10.9, E11.9).   Cholecalciferol (VITAMIN D) 50 MCG (2000 UT) CAPS Take 2,000 Units by mouth daily.   colchicine 0.6 MG tablet Take 1 tablet by mouth daily as needed (Gout).   Cranberry 500 MG TABS Take 500 mg by mouth daily.    diclofenac Sodium (VOLTAREN) 1 % GEL Apply 2 g topically daily as needed (Hand pain).   docusate sodium (COLACE) 100 MG capsule Take 1 capsule (100 mg total) by mouth 2 (two) times daily as needed for mild constipation.   gabapentin (NEURONTIN) 300 MG capsule Take 300 mg by mouth 2 (two) times daily as needed.   glucose blood test strip TRUE Metrix Blood Glucose Test Strip, use as instructed   Homeopathic Products (THERAWORX RELIEF) FOAM Apply 1 application topically 2 (two) times daily as needed (Pain).   hydrALAZINE (APRESOLINE) 50 MG tablet Take 50 mg by mouth 2 (two) times daily.   Lancets MISC 1 each by Does not apply route 2 (two) times daily. TRUE matrix lancets   levothyroxine (SYNTHROID) 150 MCG tablet Give  150 mg by mouth on Monday through Saturday and on Sunday give 75 mg a half tablet   losartan (COZAAR) 100 MG tablet Take 1 tablet (100 mg total) by mouth daily.   Menthol, Topical Analgesic, (ZIMS MAX-FREEZE EX) Apply 1 application topically daily as needed (pain).   multivitamin (THERAGRAN) per tablet Take 1 tablet by mouth daily.   nitroGLYCERIN (NITROSTAT) 0.4 MG SL tablet Place 1 tablet (0.4 mg total) under the tongue every 5 (five) minutes as needed for chest pain.   Omega-3 Fatty Acids (FISH OIL) 1200 MG CAPS Take 1,200 mg by mouth daily.   rosuvastatin (CRESTOR) 5 MG tablet Take 1 tablet (5 mg total) by mouth daily.   sodium chloride (OCEAN) 0.65 % nasal spray Place 1 spray into both nostrils at bedtime as needed for congestion.   No current  facility-administered medications for this visit. (Other)      REVIEW OF SYSTEMS:    ALLERGIES Allergies  Allergen Reactions   Pravastatin Other (See Comments)    Muscle cramps   Sildenafil Other (See Comments)    Tachycardia     Metoclopramide    Oxycodone     hallucinations   Atenolol Other (See Comments)    bradycardia   Elastic Bandages & [Zinc] Other (See Comments)    Teflon bandages - Irritation   Metoclopramide Hcl Anxiety    PAST MEDICAL HISTORY Past Medical History:  Diagnosis Date   Aortic atherosclerosis ([place]) 03/05/2020   Arthritis    Cancer ([place])    skin cancer   Carotid stenosis    a. Carotid U/S 1/61: LICA < 09%, RICA 60-45%;  b.  Carotid U/S 4/09:  RICA 81-19%; LICA 1-47%; f/u 1 year   Chest pain     Myoview in 2008 was normal.  Echo 7/09: EF 60%, normal wall motion, mild LVH, mild LAE.   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   Past Surgical History:  Procedure Laterality Date   ABDOMINAL HERNIA REPAIR  07/15/12   Dr [name]   ANTERIOR CERVICAL DECOMP/DISCECTOMY FUSION N/A 07/09/2020   Procedure: ANTERIOR CERVICAL DECOMPRESSION AND FUSION CERVICAL THREE-FOUR.;  Surgeon: [name], MD;  Location: [facility];  Service: Neurosurgery;  Laterality: N/A;  anterior   BROW LIFT  05/07/01   CYSTOSCOPY WITH URETEROSCOPY AND STENT PLACEMENT Right 01/28/2014   Procedure: CYSTOSCOPY WITH URETEROSCOPY, BASKET  RETRIVAL AND  STENT PLACEMENT;  Surgeon: [name], MD;  Location: [facility];  Service: Urology;  Laterality: Right;   epidural injections     multiple procedures   ESOPHAGEAL DILATION  1993 and 1994   multiple times   EYE SURGERY Bilateral 03/25/10, 2012   cataract removal   HEMORRHOID SURGERY  2014   HIATAL HERNIA REPAIR  12/21/93   HOLMIUM LASER APPLICATION Right  01/28/2014   Procedure: HOLMIUM LASER APPLICATION;  Surgeon: [name], MD;  Location: [facility];  Service: Urology;  Laterality: Right;   INGUINAL HERNIA REPAIR Right 07/15/12   Dr [name], x2   JOINT REPLACEMENT  07/25/04   right knee   JOINT REPLACEMENT  07/13/10   left knee   [place]  09/20/06   with intraoperative cholangiogram and right inguinal herniorrhaphy with mesh    LIGAMENT REPAIR Left 07/2013   shoulder   LITHOTRIPSY     x2   [place] FUSION/FORAMINOTOMY N/A 11/24/2020   Procedure: Posterior Cervical Laminectomy Cervical three-four, Cervical four-five with lateral mass fusion;  Surgeon: [name], MD;  Location: [facility];  Service: Neurosurgery;  Laterality: N/A;   REFRACTIVE SURGERY Bilateral    Right total hip replacement  4/14   SKIN CANCER DESTRUCTION     nose, ear   TONSILLECTOMY  as child   TOTAL HIP ARTHROPLASTY Left    URETHRAL DILATION  1991   [name]    FAMILY HISTORY Family History  Problem Relation Age of Onset   Cancer Mother    Hypertension Other    Cancer Other    Osteoarthritis Other     SOCIAL HISTORY Social History   Tobacco Use   Smoking status: Former    Years: 11.00    Pack years: 0.00    Types: Cigarettes    Quit date: 09/11/1978    Years since quitting: 42.4   Smokeless tobacco: Never  Vaping Use   Vaping Use: Never used  Substance Use Topics   Alcohol use: Not Currently    Alcohol/week: 0.0 standard drinks    Comment: rare   Drug use: No         OPHTHALMIC EXAM:  Base Eye  Exam     Visual Acuity (ETDRS)       Right Left   Dist [place] 20/80 +1 20/40 -2   Dist ph [place] 20/70 +2 20/30         Tonometry (Tonopen, 8:42 AM)       Right Left   Pressure 06 06         Pupils       Pupils Dark Light Shape React APD   Right PERRL 1 1 Round Minimal None   Left PERRL 1 1 Round Minimal None         Visual Fields (Counting fingers)       Left Right    Full Full         Extraocular Movement       Right Left    Full Full         Neuro/Psych     Oriented x3: Yes   Mood/Affect: Normal         Dilation     Right eye: 1.0% Mydriacyl, 2.5% Phenylephrine @ 8:42 AM           Slit Lamp and Fundus Exam     External Exam       Right Left   External Normal Normal         Slit Lamp Exam       Right Left   Lids/Lashes Normal Normal   Conjunctiva/Sclera White and quiet White and quiet   Cornea Clear Clear   Anterior Chamber Deep and quiet Deep and quiet   Iris PI PI   Lens Clear Clear  Anterior Vitreous Normal Normal         Fundus Exam       Right Left   Posterior Vitreous Posterior vitreous detachment    Disc Normal    C/D Ratio 0.5    Macula No overt CME, angulated vessels temporally, with thickening along the p.m. bundle.    Vessels Normal    Periphery Normal             IMAGING AND PROCEDURES  Imaging and Procedures for 02/17/21  OCT, Retina - OU - Both Eyes       Right Eye Quality was good. Scan locations included subfoveal. Central Foveal Thickness: 250. Progression has been stable. Findings include abnormal foveal contour, cystoid macular edema.   Left Eye Quality was good. Scan locations included subfoveal. Central Foveal Thickness: 222. Progression has improved. Findings include abnormal foveal contour.   Notes Cavitary CME, chronic over years because of the macular telangiectasis present for longstanding with a permanent shunt vessels perifoveal  Yet OD has increasing sign of intraretinal fluid  along the p.m. bundle nasally.  This could be RAP which has progressed since his last visit Of 07/28/2020. Commence intravitreal Avastin OD today    OS, slightly less CME temporally as compared to 2019, will schedule fluorescein angiography next visit to study the macular perfusion     Intravitreal Injection, Pharmacologic Agent - OD - Right Eye       Time Out 02/17/2021. 9:02 AM. Confirmed correct patient, procedure, site, and patient consented.   Anesthesia Topical anesthesia was used. Anesthetic medications included [place] 3.5%.   Procedure Preparation included Tobramycin 0.3%, 10% betadine to eyelids, 5% betadine to ocular surface, Ofloxacin .   Injection: 2.5 mg bevacizumab 2.5 MG/0.1ML   Route: Intravitreal   NDC: 71245-809-98   Post-op Post injection exam found visual acuity of at least counting fingers, no retinal detachment, perfused optic nerve. The patient tolerated the procedure well. There were no complications. The patient received written and verbal post procedure care education. Post injection medications were not given.              ASSESSMENT/PLAN:  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HCC)  Active PERI papillary and in the p.m. bundle OD, need to commence with therapy OD not related to MAC-TEL     ICD-10-CM   1. 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HCC)  H35.3211 OCT, Retina - OU - Both Eyes    Intravitreal Injection, Pharmacologic Agent - OD - Right Eye    bevacizumab (AVASTIN) SOSY 2.5 mg      1.  History of bilateral MAC-TEL, new onset PERI papillary CNVM and beneath the p.m. bundle OD.  2.  Commence with antivegF, intravitreal Avastin OD today  3.  Ophthalmic Meds Ordered this visit:  Meds ordered this encounter  Medications   bevacizumab (AVASTIN) SOSY 2.5 mg       Return in about 5 weeks (around 03/24/2021) for  dilate, OD, AVASTIN OCT.  There are no Patient Instructions on file for this visit.   Explained the diagnoses, plan, and follow up with the patient and they expressed understanding.  Patient expressed understanding of the importance of proper follow up care.   Clent Demark Joshue Badal M.D. Diseases & Surgery of the Retina and Vitreous [facility] 02/17/21     Abbreviations: M myopia (nearsighted); A astigmatism; H hyperopia (farsighted); P presbyopia; Mrx spectacle prescription;  CTL  contact lenses; OD right eye; OS left eye; OU both eyes  XT exotropia; ET esotropia; PEK punctate epithelial keratitis; PEE punctate epithelial erosions; DES dry eye syndrome; MGD meibomian gland dysfunction; ATs artificial tears; PFAT's preservative free artificial tears; Speed nuclear sclerotic cataract; PSC posterior subcapsular cataract; ERM epi-retinal membrane; PVD posterior vitreous detachment; RD retinal detachment; DM diabetes mellitus; DR diabetic retinopathy; NPDR non-proliferative diabetic retinopathy; PDR proliferative diabetic retinopathy; CSME clinically significant macular edema; DME diabetic macular edema; dbh dot blot hemorrhages; CWS cotton wool spot; POAG primary open angle glaucoma; C/D cup-to-disc ratio; HVF humphrey visual field; GVF goldmann visual field; OCT optical coherence tomography; IOP intraocular pressure; BRVO Branch retinal vein occlusion; CRVO central retinal vein occlusion; CRAO central retinal artery occlusion; BRAO branch retinal artery occlusion; RT retinal tear; SB scleral buckle; PPV pars plana vitrectomy; VH Vitreous hemorrhage; PRP panretinal laser photocoagulation; IVK intravitreal kenalog; VMT vitreomacular traction; MH Macular hole;  NVD neovascularization of the disc; NVE neovascularization elsewhere; AREDS age related eye disease study; ARMD age related macular degeneration; POAG primary open angle glaucoma; EBMD epithelial/anterior basement membrane dystrophy; ACIOL  anterior chamber intraocular lens; IOL intraocular lens; PCIOL posterior chamber intraocular lens; Phaco/IOL phacoemulsification with intraocular lens placement; [place] photorefractive keratectomy; LASIK laser assisted in situ keratomileusis; HTN hypertension; DM diabetes mellitus; COPD chronic obstructive pulmonary disease

## 2021-02-21 DIAGNOSIS — G4733 Obstructive sleep apnea (adult) (pediatric): Secondary | ICD-10-CM | POA: Diagnosis not present

## 2021-03-02 ENCOUNTER — Ambulatory Visit (INDEPENDENT_AMBULATORY_CARE_PROVIDER_SITE_OTHER): Payer: Medicare HMO | Admitting: Family

## 2021-03-02 ENCOUNTER — Encounter: Payer: Self-pay | Admitting: Family

## 2021-03-02 ENCOUNTER — Other Ambulatory Visit: Payer: Self-pay

## 2021-03-02 VITALS — BP 134/62 | HR 67 | Temp 98.4°F | Resp 16 | Wt 151.0 lb

## 2021-03-02 DIAGNOSIS — M5416 Radiculopathy, lumbar region: Secondary | ICD-10-CM

## 2021-03-02 DIAGNOSIS — R159 Full incontinence of feces: Secondary | ICD-10-CM | POA: Diagnosis not present

## 2021-03-02 DIAGNOSIS — M25559 Pain in unspecified hip: Secondary | ICD-10-CM

## 2021-03-02 DIAGNOSIS — R739 Hyperglycemia, unspecified: Secondary | ICD-10-CM | POA: Diagnosis not present

## 2021-03-02 DIAGNOSIS — I10 Essential (primary) hypertension: Secondary | ICD-10-CM | POA: Diagnosis not present

## 2021-03-02 DIAGNOSIS — F4321 Adjustment disorder with depressed mood: Secondary | ICD-10-CM

## 2021-03-02 DIAGNOSIS — R2681 Unsteadiness on feet: Secondary | ICD-10-CM

## 2021-03-02 DIAGNOSIS — E039 Hypothyroidism, unspecified: Secondary | ICD-10-CM | POA: Diagnosis not present

## 2021-03-02 DIAGNOSIS — G959 Disease of spinal cord, unspecified: Secondary | ICD-10-CM

## 2021-03-02 DIAGNOSIS — R197 Diarrhea, unspecified: Secondary | ICD-10-CM | POA: Diagnosis not present

## 2021-03-02 DIAGNOSIS — M1A0720 Idiopathic chronic gout, left ankle and foot, without tophus (tophi): Secondary | ICD-10-CM | POA: Diagnosis not present

## 2021-03-02 DIAGNOSIS — R269 Unspecified abnormalities of gait and mobility: Secondary | ICD-10-CM

## 2021-03-02 DIAGNOSIS — E785 Hyperlipidemia, unspecified: Secondary | ICD-10-CM

## 2021-03-02 DIAGNOSIS — J439 Emphysema, unspecified: Secondary | ICD-10-CM

## 2021-03-02 DIAGNOSIS — I6521 Occlusion and stenosis of right carotid artery: Secondary | ICD-10-CM

## 2021-03-02 HISTORY — DX: Unspecified abnormalities of gait and mobility: R26.9

## 2021-03-02 HISTORY — DX: Unsteadiness on feet: R26.81

## 2021-03-02 HISTORY — DX: Pain in unspecified hip: M25.559

## 2021-03-02 HISTORY — DX: Radiculopathy, lumbar region: M54.16

## 2021-03-02 HISTORY — DX: Adjustment disorder with depressed mood: F43.21

## 2021-03-02 MED ORDER — LEVOTHYROXINE SODIUM 75 MCG PO TABS: ORAL_TABLET | ORAL | 3 refills | Status: DC

## 2021-03-02 MED ORDER — CHOLESTYRAMINE 4 G PO PACK: 4.0000 g | PACK | Freq: Two times a day (BID) | ORAL | 2 refills | Status: DC | PRN

## 2021-03-02 NOTE — Patient Instructions (Addendum)
Please complete lab work piror to leaving. We will work on getting your MRI scheduled.

## 2021-03-02 NOTE — Assessment & Plan Note (Signed)
His RUE/RLE strength seems improved post operatively. He continues to follow with [name] (neurosurgery).

## 2021-03-02 NOTE — Assessment & Plan Note (Signed)
Check stool profile, add trial fo prn questran.

## 2021-03-02 NOTE — Assessment & Plan Note (Signed)
I think his motivation issues and fatigue are related to his frustration with his mobility issues. Will monitor for now. If symptoms worse or if symptoms fail to improve, might consider SSRI.

## 2021-03-02 NOTE — Assessment & Plan Note (Signed)
Given his hx of lumbar disc disease, I would like to have him complete an MRI of the lumbar spine to rule out Cauda Equina syndrome.

## 2021-03-02 NOTE — Assessment & Plan Note (Signed)
Refer for PT

## 2021-03-02 NOTE — Assessment & Plan Note (Addendum)
BP Readings from Last 3 Encounters:  03/02/21 134/62  01/13/21 (!) 170/80  12/29/20 121/65   On amlodipine 5mg  and losartan 100mg  and hydralazine 50mg  bid.

## 2021-03-02 NOTE — Assessment & Plan Note (Signed)
>>  ASSESSMENT AND PLAN FOR BENIGN ESSENTIAL HYPERTENSION WRITTEN ON 03/02/2021 10:09 AM BY O'SULLIVAN, Quest Tavenner, NP  BP Readings from Last 3 Encounters:  03/02/21 134/62  01/13/21 (!) 170/80  12/29/20 121/65   On amlodipine 5mg  and losartan 100mg  and hydralazine 50mg  bid.

## 2021-03-02 NOTE — Assessment & Plan Note (Signed)
Stable on allopurinol 100mg  once daily. Continue same. He has colchicine on hand for prn use.

## 2021-03-02 NOTE — Assessment & Plan Note (Addendum)
Lab Results  Component Value Date   TSH 0.46 12/29/2020   Stable, continue synthroid 117mcg 6 days a week, and 1/2 tab one day a week.

## 2021-03-02 NOTE — Assessment & Plan Note (Signed)
Lab Results  Component Value Date   CHOL 106 06/15/2020   HDL 38 (L) 06/15/2020   LDLCALC 50 06/15/2020   TRIG 98 06/15/2020   CHOLHDL 2.8 06/15/2020   LDL at goal. Continue crestor 5mg .

## 2021-03-02 NOTE — Assessment & Plan Note (Signed)
Lab Results  Component Value Date   HGBA1C 5.7 12/29/2020

## 2021-03-02 NOTE — Assessment & Plan Note (Signed)
Stable, continue albuterol prn.

## 2021-03-02 NOTE — Progress Notes (Signed)
Subjective:   By signing my name below, I, [name], attest that this documentation has been prepared under the direction and in the presence of [name] NP. 03/02/2021     Patient ID: [name] Pratt, male    DOB: [birth year], 80 y.o.   MRN: [id number]  Chief Complaint  Patient presents with   Hypertension    Here for follow up   Hypothyroidism    Here for follow u   Encopresis    Patient complains or urgency when needing to have a bowel movement. Has had about 5 episodes of bowel incontinence    HPI Patient is in today for a office visit.  He complains of having a dry throat. He had a history of septoplasty and had a follow up procedure. He thinks that his previous procedure contributes to these symptoms. He continues taking 300 gabapentin daily PO at night.   Emphysema- His breathing is doing well at this time. He only notes getting winded after walking of the steps to his house. Otherwise he has no other new problems.  Bowel movements- He complains of having occasionally feeling having an urge for a bowel movement but not having one. He also has episodes of diarrhea with fecal incontinence which have started recently He notes that his bowels are not completely liquid but soft. He has numbness on his buttocks on the right side as well as some numbness in the right groin.  He has a remote history of cholecystectomy.   Urine- He had an episode of burning a couple of days ago which has completely resolved. He has issues leaking urine before getting to the restroom. He usually gets a strong urge to urinate after standing up. These urinary symptoms are long standing.   Thyroid- He continues taking 150 mcg Synthroid daily PO for 6 days a week and 75 mcg once daily one day a week.  Fatigue- He reports having lack of energy and motivation to complete daily tasks. His limiting mobility from hip pain and knee pain has contributed to these symptoms. He is willing to try  physical therapy to help manage his symptoms. He felt bad about himself after having and an adverse reaction to Oxycodone in the hospital (hallucinations). His mood has improved since then and he states that he feels much better about himself.  Gout- He has not had any recent episodes of his gout flaring up. He continues taking 0.6 colchicine PRN.   Hypertension- His blood pressure is doing well at his time. He continues taking 100 mg losartan daily PO and 5 mg amlodipine daily PO. He has slight leg swelling at this time. He continues taking 50 mg hydralazine daily PO.   BP Readings from Last 3 Encounters:  03/02/21 134/62  01/13/21 (!) 170/80  12/29/20 121/65    Health Maintenance Due  Topic Date Due   COVID-19 Vaccine (4 - Booster for Pfizer series) 09/15/2020   TETANUS/TDAP  12/26/2020    Past Medical History:  Diagnosis Date   Aortic atherosclerosis ([place]) 03/05/2020   Arthritis    Cancer (HCC)    skin cancer   Carotid stenosis    a. Carotid U/S 1/74: LICA < 08%, RICA 14-48%;  b.  Carotid U/S 1/85:  RICA 63-14%; LICA 9-70%; f/u 1 year   Chest pain     Myoview in 2008 was normal.  Echo 7/09: EF 60%, normal wall motion, mild LVH, mild LAE.   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    Past Surgical History:  Procedure Laterality Date   ABDOMINAL HERNIA REPAIR   07/15/12   Dr [name]   ANTERIOR CERVICAL DECOMP/DISCECTOMY FUSION N/A 07/09/2020   Procedure: ANTERIOR CERVICAL DECOMPRESSION AND FUSION CERVICAL THREE-FOUR.;  Surgeon: [name], MD;  Location: [facility];  Service: Neurosurgery;  Laterality: N/A;  anterior   BROW LIFT  05/07/01   CYSTOSCOPY WITH URETEROSCOPY AND STENT PLACEMENT Right 01/28/2014   Procedure: CYSTOSCOPY WITH URETEROSCOPY, BASKET RETRIVAL AND  STENT PLACEMENT;  Surgeon: [name], MD;  Location: [facility];  Service: Urology;  Laterality: Right;   epidural injections     multiple procedures   ESOPHAGEAL DILATION  1993 and 1994   multiple times   EYE SURGERY Bilateral 03/25/10, 2012   cataract removal   HEMORRHOID SURGERY  2014   HIATAL HERNIA REPAIR  12/21/93   HOLMIUM LASER APPLICATION Right 0/45/9977   Procedure: HOLMIUM LASER APPLICATION;  Surgeon: [name], MD;  Location: [facility];  Service: Urology;  Laterality: Right;   INGUINAL HERNIA REPAIR Right 07/15/12   Dr [name], x2   JOINT REPLACEMENT  07/25/04   right knee   JOINT REPLACEMENT  07/13/10   left knee   [place]  09/20/06   with intraoperative cholangiogram and right inguinal herniorrhaphy with mesh    LIGAMENT REPAIR Left 07/2013   shoulder   LITHOTRIPSY     x2   [place] FUSION/FORAMINOTOMY N/A 11/24/2020   Procedure: Posterior Cervical Laminectomy Cervical three-four, Cervical four-five with lateral mass fusion;  Surgeon: [name], MD;  Location: [facility];  Service: Neurosurgery;  Laterality: N/A;   REFRACTIVE SURGERY Bilateral    Right total hip replacement  4/14   SKIN CANCER DESTRUCTION     nose, ear   TONSILLECTOMY  as child   TOTAL HIP ARTHROPLASTY Left    URETHRAL DILATION  1991   [name]    Family History  Problem Relation Age of Onset   Cancer Mother    Hypertension Other    Cancer Other    Osteoarthritis Other     Social History   Socioeconomic History    Marital status: Married    Spouse name: Not on file   Number of children: 1   Years of education: Not on file   Highest education level: Not on file  Occupational History   Occupation: firefighter    Comment: paints on the side  Tobacco Use   Smoking status: Former    Years: 11.00    Pack years: 0.00    Types: Cigarettes    Quit date: 09/11/1978    Years since quitting: 42.5   Smokeless tobacco:  Never  Vaping Use   Vaping Use: Never used  Substance and Sexual Activity   Alcohol use: Not Currently    Alcohol/week: 0.0 standard drinks    Comment: rare   Drug use: No   Sexual activity: Not on file  Other Topics Concern   Not on file  Social History Narrative   Lives with his wife   He has one son- lives locally.   2 grandchildren   Has worked for [employer], trade [employer]   Enjoys wood working   Completed HS.  [employer]   Social Determinants of Radio broadcast assistant Strain: Not on file  Food Insecurity: Not on file  Transportation Needs: Not on file  Physical Activity: Not on file  Stress: Not on file  Social Connections: Not on file  Intimate Partner Violence: Not on file    Outpatient Medications Prior to Visit  Medication Sig Dispense Refill   acetaminophen (TYLENOL) 650 MG CR tablet Take 650-1,300 mg by mouth See admin instructions. Take 1300 mg in the morning and 650 g at bedtime     albuterol (VENTOLIN HFA) 108 (90 Base) MCG/ACT inhaler Inhale 2 puffs into the lungs every 6 (six) hours as needed for shortness of breath. 1 each 3   Alcohol Swabs (ALCOHOL PADS) 70 % PADS 1 packet by Does not apply route 2 (two) times daily. 100 each 1   allopurinol (ZYLOPRIM) 100 MG tablet TAKE 1 TABLET TWICE DAILY 180 tablet 1   amLODipine (NORVASC) 5 MG tablet TAKE 1 TABLET EVERY DAY 90 tablet 1   Artificial Tear Solution (SOOTHE XP) SOLN Place 1 drop into both eyes 2 (two) times daily as needed (Dry eyes).     aspirin EC 81 MG tablet Take 81 mg by mouth every morning.     blood  glucose meter kit and supplies Dispense based on patient and insurance preference. Use up to four times daily as directed. (FOR ICD-10 E10.9, E11.9). 1 each 0   Cholecalciferol (VITAMIN D) 50 MCG (2000 UT) CAPS Take 2,000 Units by mouth daily.     colchicine 0.6 MG tablet Take 1 tablet by mouth daily as needed (Gout).     Cranberry 500 MG TABS Take 500 mg by mouth daily.      cycloSPORINE (RESTASIS) 0.05 % ophthalmic emulsion Place 1 drop into both eyes 2 (two) times daily.     diclofenac Sodium (VOLTAREN) 1 % GEL Apply 2 g topically daily as needed (Hand pain).     gabapentin (NEURONTIN) 300 MG capsule Take 300 mg by mouth 2 (two) times daily as needed.     glucose blood test strip TRUE Metrix Blood Glucose Test Strip, use as instructed 100 each 12   Homeopathic Products (THERAWORX RELIEF) FOAM Apply 1 application topically 2 (two) times daily as needed (Pain).     hydrALAZINE (APRESOLINE) 50 MG tablet Take 50 mg by mouth 2 (two) times daily.     Lancets MISC 1 each by Does not apply route 2 (two) times daily. TRUE matrix lancets 100 each 3   losartan (COZAAR) 100 MG tablet Take 1 tablet (100 mg total) by mouth daily. 90 tablet 1   Menthol, Topical Analgesic, (ZIMS MAX-FREEZE EX) Apply 1 application topically daily as needed (pain).     multivitamin (THERAGRAN) per tablet Take 1 tablet by mouth daily.     nitroGLYCERIN (NITROSTAT) 0.4 MG SL tablet Place 1 tablet (0.4 mg total) under the tongue every 5 (five) minutes as needed  for chest pain. 25 tablet 3   Omega-3 Fatty Acids (FISH OIL) 1200 MG CAPS Take 1,200 mg by mouth daily.     rosuvastatin (CRESTOR) 5 MG tablet Take 1 tablet (5 mg total) by mouth daily. 90 tablet 0   sodium chloride (OCEAN) 0.65 % nasal spray Place 1 spray into both nostrils at bedtime as needed for congestion.     docusate sodium (COLACE) 100 MG capsule Take 1 capsule (100 mg total) by mouth 2 (two) times daily as needed for mild constipation. 10 capsule 0   levothyroxine  (SYNTHROID) 150 MCG tablet Give 150 mg by mouth on Monday through Saturday and on Sunday give 75 mg a half tablet     No facility-administered medications prior to visit.    Allergies  Allergen Reactions   Pravastatin Other (See Comments)    Muscle cramps   Sildenafil Other (See Comments)    Tachycardia     Metoclopramide    Oxycodone     hallucinations   Atenolol Other (See Comments)    bradycardia   Elastic Bandages & [Zinc] Other (See Comments)    Teflon bandages - Irritation   Metoclopramide Hcl Anxiety    Review of Systems  Constitutional:  Positive for malaise/fatigue.  Cardiovascular:  Positive for leg swelling.  Gastrointestinal:  Positive for diarrhea.  Musculoskeletal:  Positive for joint pain (Hips and knees.).  Psychiatric/Behavioral:  Positive for depression.       Objective:    Physical Exam Constitutional:      General: He is not in acute distress.    Appearance: Normal appearance. He is not ill-appearing.  HENT:     Head: Normocephalic and atraumatic.     Right Ear: External ear normal.     Left Ear: External ear normal.  Eyes:     Extraocular Movements: Extraocular movements intact.     Pupils: Pupils are equal, round, and reactive to light.  Cardiovascular:     Rate and Rhythm: Normal rate and regular rhythm.     Pulses: Normal pulses.     Heart sounds:    No gallop.  Pulmonary:     Effort: Pulmonary effort is normal. No respiratory distress.     Breath sounds: Normal breath sounds. No wheezing, rhonchi or rales.  Abdominal:     General: Bowel sounds are normal. There is no distension.     Palpations: Abdomen is soft. There is no mass (Umbilical hernia).     Tenderness: There is no abdominal tenderness. There is no guarding or rebound.     Hernia: A hernia is present. Hernia is present in the umbilical area (Small and reducable).  Musculoskeletal:     Right lower leg: 2+ Edema (1-2+ lower extremity edema bilateral) present.     Left lower  leg: 2+ Edema (1-2+ lower extremity edema bilateral) present.     Comments: 5/5 strength in left upper extremity and 4/5 strength in right upper extremity. 5/5 strength in lower extremities.  Skin:    General: Skin is warm and dry.  Neurological:     Mental Status: He is alert and oriented to person, place, and time.     Deep Tendon Reflexes:     Reflex Scores:      Bicep reflexes are 2+ on the right side and 2+ on the left side.      Brachioradialis reflexes are 2+ on the right side and 2+ on the left side.      Patellar reflexes are 3+  on the right side and 3+ on the left side. Psychiatric:        Behavior: Behavior normal.    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 Left Carotid: Velocities in the left ICA are consistent with a 1-39% stenosis. Nonhemodynamically significant plaque <50% noted in the CCA.  Continue aspirin 47m. Unfortunately, he is intolerant to statins.        Relevant Medications   cholestyramine (QUESTRAN) 4 g packet   Situational depression    I think his motivation issues and fatigue are related to his frustration with his mobility issues. Will monitor for now. If symptoms worse or if symptoms fail to improve, might consider SSRI.        Lumbar radiculopathy   Relevant Orders   GI Profile, Stool, PCR   Incontinence of feces    Given his hx of lumbar disc disease, I would like to have him complete an MRI of the  lumbar spine to rule out Cauda Equina syndrome.        Relevant Orders   MR Lumbar Spine Wo Contrast   Hypothyroidism    Lab Results  Component Value Date   TSH 0.46 12/29/2020  Stable, continue synthroid 1517m 6 days a week, and 1/2 tab one day a week.        Relevant Medications   levothyroxine (SYNTHROID) 75 MCG tablet   Hyperlipidemia    Lab Results  Component Value Date   CHOL 106 06/15/2020   HDL 38 (L) 06/15/2020   LDLCALC 50 06/15/2020   TRIG 98 06/15/2020   CHOLHDL 2.8 06/15/2020  LDL at goal. Continue crestor 79m52m       Relevant Medications   cholestyramine (QUESTRAN) 4 g packet   Hyperglycemia    Lab Results  Component Value Date   HGBA1C 5.7 12/29/2020         Hip pain - Primary   Relevant Orders   Ambulatory referral to Orthopedics   GOUT    Stable on allopurinol 100m53mce daily. Continue same. He has colchicine on hand for prn use.        Gait disorder    Refer for PT.        Relevant Orders   Ambulatory referral to Physical Therapy   Essential hypertension    BP Readings from Last 3 Encounters:  03/02/21 134/62  01/13/21 (!) 170/80  12/29/20 121/65  On amlodipine 79mg 74m losartan 100mg 179mhydralazine 50mg b58m         Relevant Medications   cholestyramine (QUESTRAN) 4 g packet   Emphysema, unspecified (HCC)    Stable, continue albuterol prn.        Diarrhea    Check stool profile, add trial fo prn questran.        Relevant Medications  cholestyramine (QUESTRAN) 4 g packet   Cervical myelopathy (HCC)    His RUE/RLE strength seems improved post operatively. He continues to follow with [name] (neurosurgery).         Meds ordered this encounter  Medications   cholestyramine (QUESTRAN) 4 g packet    Sig: Take 1 packet (4 g total) by mouth 2 (two) times daily as needed.    Dispense:  60 each    Refill:  2    Order Specific Question:   Supervising Provider    Answer:   Penni Homans A [[id number]]   levothyroxine  (SYNTHROID) 75 MCG tablet    Sig: Take 2 tabs by mouth once daily 6 days a week and 1 tab by mouth once daily one day a week    Dispense:  60 tablet    Refill:  3    Order Specific Question:   Supervising Provider    Answer:   Penni Homans A [[id number]]   45 minutes spent on today's visit. Time was spent examining patient, reviewing medical record, formulating medical plan.  I, [name] NP, personally preformed the services described in this documentation.  All medical record entries made by the scribe were at my direction and in my presence.  I have reviewed the chart and discharge instructions (if applicable) and agree that the record reflects my personal performance and is accurate and complete. 03/02/2021   I,[name],acting as a Education administrator for Nance Pear, NP.,have documented all relevant documentation on the behalf of Nance Pear, NP,as directed by  Nance Pear, NP while in the presence of Nance Pear, NP.   Nance Pear, NP

## 2021-03-02 NOTE — Assessment & Plan Note (Signed)
Carotid duplex 3/21 noted the following:  Right Carotid: Velocities in the right ICA are consistent with a 1-39% stenosis. Left Carotid: Velocities in the left ICA are consistent with a 1-39% stenosis. Nonhemodynamically significant plaque <50% noted in the CCA.  Continue aspirin 81mg . Unfortunately, he is intolerant to statins.

## 2021-03-03 ENCOUNTER — Other Ambulatory Visit (INDEPENDENT_AMBULATORY_CARE_PROVIDER_SITE_OTHER): Payer: Medicare HMO

## 2021-03-03 DIAGNOSIS — M5416 Radiculopathy, lumbar region: Secondary | ICD-10-CM

## 2021-03-03 DIAGNOSIS — N3941 Urge incontinence: Secondary | ICD-10-CM | POA: Diagnosis not present

## 2021-03-03 DIAGNOSIS — R3915 Urgency of urination: Secondary | ICD-10-CM | POA: Diagnosis not present

## 2021-03-03 DIAGNOSIS — R159 Full incontinence of feces: Secondary | ICD-10-CM | POA: Diagnosis not present

## 2021-03-03 DIAGNOSIS — R197 Diarrhea, unspecified: Secondary | ICD-10-CM | POA: Diagnosis not present

## 2021-03-03 NOTE — Addendum Note (Signed)
Addended by: Kelle Darting A on: 03/03/2021 10:06 AM   Modules accepted: Orders

## 2021-03-05 ENCOUNTER — Ambulatory Visit (HOSPITAL_BASED_OUTPATIENT_CLINIC_OR_DEPARTMENT_OTHER)
Admission: RE | Admit: 2021-03-05 | Discharge: 2021-03-05 | Disposition: A | Discharge status: Home / Self Care | Payer: Medicare HMO | Source: Ambulatory Visit | Attending: Family | Admitting: Family

## 2021-03-05 ENCOUNTER — Other Ambulatory Visit: Payer: Self-pay

## 2021-03-05 DIAGNOSIS — M545 Low back pain, unspecified: Secondary | ICD-10-CM | POA: Diagnosis not present

## 2021-03-05 DIAGNOSIS — R159 Full incontinence of feces: Secondary | ICD-10-CM | POA: Diagnosis not present

## 2021-03-06 LAB — GI PROFILE, STOOL, PCR
Adenovirus F 40/41: NOT DETECTED
Astrovirus: NOT DETECTED
C difficile toxin A/B: DETECTED — AB
Campylobacter: NOT DETECTED
Cryptosporidium: NOT DETECTED
Cyclospora cayetanensis: NOT DETECTED
Entamoeba histolytica: NOT DETECTED
Enteroaggregative E coli: NOT DETECTED
Enteropathogenic E coli: NOT DETECTED
Enterotoxigenic E coli: NOT DETECTED
Giardia lamblia: NOT DETECTED
Norovirus GI/GII: NOT DETECTED
Plesiomonas shigelloides: NOT DETECTED
Rotavirus A: NOT DETECTED
Salmonella: NOT DETECTED
Sapovirus: NOT DETECTED
Shiga-toxin-producing E coli: NOT DETECTED
Shigella/Enteroinvasive E coli: NOT DETECTED
Vibrio cholerae: NOT DETECTED
Vibrio: NOT DETECTED
Yersinia enterocolitica: NOT DETECTED

## 2021-03-07 ENCOUNTER — Telehealth: Payer: Self-pay

## 2021-03-07 ENCOUNTER — Other Ambulatory Visit: Payer: Self-pay

## 2021-03-07 ENCOUNTER — Telehealth: Payer: Self-pay | Admitting: Family

## 2021-03-07 DIAGNOSIS — A0472 Enterocolitis due to Clostridium difficile, not specified as recurrent: Secondary | ICD-10-CM

## 2021-03-07 DIAGNOSIS — H35073 Retinal telangiectasis, bilateral: Secondary | ICD-10-CM

## 2021-03-07 MED ORDER — FIDAXOMICIN 200 MG PO TABS: 200.0000 mg | ORAL_TABLET | Freq: Two times a day (BID) | ORAL | 0 refills | Status: DC

## 2021-03-07 MED ORDER — VANCOMYCIN HCL 125 MG PO CAPS: 125.0000 mg | ORAL_CAPSULE | Freq: Four times a day (QID) | ORAL | 0 refills | Status: AC

## 2021-03-07 NOTE — Telephone Encounter (Signed)
Please fax a copy of pt's MRI to his neurosurgeon ([name]) and ask his staff to have him call me after he reviews the MRI (cell [phone]) or send me a note in [place].  Thanks

## 2021-03-07 NOTE — Telephone Encounter (Signed)
Pt says the cost for the Dificid id $1000 after insurance.

## 2021-03-07 NOTE — Telephone Encounter (Signed)
Pt is requesting a call back regarding the rx that Melissa sent in for him for the infection in the digestive system, pt states out of pocket the medicine is over 1000.00. Pt states he can not afford that and need to know what an alternative is .

## 2021-03-07 NOTE — Telephone Encounter (Signed)
Pt aware and voices understanding.   

## 2021-03-07 NOTE — Telephone Encounter (Signed)
Rx has been changed to vancomycin QID x 10 days. Please ask pt to let us know if this medication is too expensive.

## 2021-03-07 NOTE — Telephone Encounter (Signed)
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 This is an infection in his gut that is often caused by recent antibiotic use. I would like him to begin a medicine called dificid which is dosed 200mg  twice daily for 10 days. Please complete the full 10 days and let me know if his diarrhea worsens or if it does not resolve with the treatment.

## 2021-03-07 NOTE — Telephone Encounter (Signed)
Pt aware and voices understanding. He did mention that he is not having diarrhea though, he says he is having more constipation since taking the Cochicine. He asks if he needs to remain on this medication.

## 2021-03-07 NOTE — Telephone Encounter (Signed)
Results faxed with cb request.

## 2021-03-07 NOTE — Telephone Encounter (Signed)
Urgent PA started for [name]: [id number] - PA Case ID: [id number] - Rx #: [id number]

## 2021-03-07 NOTE — Telephone Encounter (Signed)
PA approved:   Approved today PA Case: [id number], Status: Approved, Coverage Starts on: 09/11/2020 12:00:00 AM, Coverage Ends on: 09/10/2021 12:00:00 AM. Questions? Contact [phone].

## 2021-03-08 ENCOUNTER — Ambulatory Visit: Payer: Medicare HMO | Admitting: Physical Therapy

## 2021-03-09 NOTE — Telephone Encounter (Signed)
Pt was able to pick up vancomycin and begin for C. Diff. He had a hard BM today.   Reviewed MRI results with [name] (he viewed MRI and does not think that there are any acute surgical needs in the lumbar spine and does not think spinal cord compression is severe enough to cause any neurological issues.  He wants to let him heal from his C-spine surgery at this time. Reviewed MRI results with the patient as well.

## 2021-03-10 ENCOUNTER — Encounter: Payer: Self-pay | Admitting: Orthopaedic Surgery

## 2021-03-10 ENCOUNTER — Ambulatory Visit (INDEPENDENT_AMBULATORY_CARE_PROVIDER_SITE_OTHER): Payer: Medicare HMO

## 2021-03-10 ENCOUNTER — Ambulatory Visit: Payer: Medicare HMO | Admitting: Orthopaedic Surgery

## 2021-03-10 DIAGNOSIS — M25551 Pain in right hip: Secondary | ICD-10-CM

## 2021-03-10 NOTE — Progress Notes (Signed)
Office Visit Note   Patient: [name] Pratt           Date of Birth: 01-Dec-1940           MRN: [id number] Visit Date: 03/10/2021              Requested by: [name], NP [address] [place],  [place] [address] PCP: [name], NP   Assessment & Plan: Visit Diagnoses:  1. Pain in right hip     Plan: I believe the patient is suffering primarily from degenerative changes throughout the entire lumbar spine which have subsequently caused him to ambulate with a Trendelenburg gait further causing symptoms consistent with trochanteric bursitis.  He is currently seeing [name] for his back and is scheduled to undergo a radiofrequency ablation next month.  As far as his hip, there is nothing untoward with the implant.  I believe if we inject the trochanteric bursa, his symptoms would return as long as he continues to walk with an Trendelenburg gait.  He will follow-up with [place] as needed.  Follow-Up Instructions: Return if symptoms worsen or fail to improve.   Orders:  Orders Placed This Encounter  Procedures   XR HIP UNILAT W OR W/O PELVIS 2-3 VIEWS RIGHT   No orders of the defined types were placed in this encounter.     Procedures: No procedures performed   Clinical Data: No additional findings.   Subjective: Chief Complaint  Patient presents with   Right Hip - Pain    HPI patient is a very pleasant 80 year old gentleman who comes in today with right hip pain and right lower extremity weakness.  He is status post right total hip replacement 2005 by Dr. Mitzi Davenport in [place].  He was doing well until about a year ago.  He began having some soreness to the groin and lateral hip in addition to chronic underlying right lower back pain.  He is also been dealing with right lower extremity weakness and is ambulating with a Trendelenburg gait.  He has increased pain to the right hip when he is sleeping on the right side.  He has tried steroids  without significant relief.  He is also been using topical medications which do seem to help.  He is currently being seen by [name] where he is scheduled to undergo RSA the middle of next week due to failed epidural steroid injections in the past.  Review of Systems as detailed in HPI.  All others reviewed and are negative.   Objective: Vital Signs: There were no vitals taken for this visit.  Physical Exam well-developed well-nourished gentleman in no acute distress.  Alert and oriented x3.  Ortho Exam right hip exam shows a positive logroll.  Moderate tenderness to the greater trochanter.  Negative straight leg raise.  No pain with lumbar flexion or extension.  No spinous or paraspinous tenderness.  Mild weakness with resisted knee flexion.  He is neurovascular intact distally.  Specialty Comments:  No specialty comments available.  Imaging: XR HIP UNILAT W OR W/O PELVIS 2-3 VIEWS RIGHT  Result Date: 03/10/2021 Well-seated prosthesis without complication    PMFS History: Patient Active Problem List   Diagnosis Date Noted   Diarrhea 03/02/2021   Lumbar radiculopathy 03/02/2021   Gait disorder 03/02/2021   Hip pain 03/02/2021   Incontinence of feces 03/02/2021   Situational depression 03/02/2021   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[place]) 02/09/2021  Visual hallucinations 11/30/2020   Cervical myelopathy ([place]) 11/24/2020   Cystoid macular edema of both eyes 07/28/2020   Status post cervical spinal fusion 07/09/2020   History of total bilateral knee replacement 07/08/2020   Aortic atherosclerosis ([place]) 03/05/2020   Emphysema, unspecified ([place]) 03/05/2020   Heme positive stool 02/29/2020   Leg cramps 11/13/2019   Tibialis anterior tendon tear, nontraumatic 11/13/2019   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   CPAP (continuous positive airway pressure) dependence 03/25/2018   Complex sleep apnea  syndrome 03/25/2018   Acute idiopathic gout of hand 03/25/2018   Erectile dysfunction 06/20/2017   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 03/06/2017   Peripheral neuropathy 06/19/2016   Hearing loss 02/02/2016   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   Allergic rhinitis 02/02/2016   Atypical chest pain 11/24/2015   Preventative health care 11/24/2015   Onychomycosis 06/30/2015   Degenerative disc disease, lumbar 06/30/2015   Other allergic rhinitis 02/18/2015   Deviated nasal septum 02/18/2015   OSA on CPAP 02/18/2015   RLS (restless legs syndrome) 02/18/2015   GERD (gastroesophageal reflux disease) 09/18/2014   Hyperglycemia 03/03/2014   Sleep apnea with use of continuous positive airway pressure (CPAP) 07/15/2013   Carotid stenosis 02/24/2013   Nonspecific abnormal electrocardiogram (ECG) (EKG) 01/06/2013   DJD (degenerative joint disease) of hip 12/24/2012   Lower GI bleed 12/12/2012   Dyspnea 06/12/2011   Hypothyroidism 05/13/2009   HYPERCHOLESTEROLEMIA 05/13/2009   Hyperlipidemia 05/13/2009   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   Essential hypertension 05/13/2009   INGUINAL HERNIA 05/13/2009   HIATAL HERNIA 05/13/2009   NEPHROLITHIASIS 05/13/2009   ROSACEA 05/13/2009   Past Medical History:  Diagnosis Date   Aortic atherosclerosis ([place]) 03/05/2020   Arthritis    Cancer ([place])    skin cancer   Carotid stenosis    a. Carotid U/S 7/00: LICA < 17%, RICA 49-44%;  b.  Carotid U/S 9/67:  RICA 59-16%; LICA 3-84%; f/u 1 year   Chest pain     Myoview in 2008 was normal.  Echo 7/09: EF 60%, normal wall motion, mild LVH, mild LAE.   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    Family History  Problem Relation Age of Onset   Cancer Mother    Hypertension Other    Cancer Other    Osteoarthritis Other     Past Surgical History:  Procedure Laterality Date   ABDOMINAL HERNIA REPAIR  07/15/12   Dr [name]   ANTERIOR CERVICAL DECOMP/DISCECTOMY FUSION N/A 07/09/2020   Procedure: ANTERIOR CERVICAL DECOMPRESSION AND FUSION CERVICAL THREE-FOUR.;  Surgeon: [name], MD;  Location: [facility];  Service: Neurosurgery;  Laterality: N/A;  anterior   BROW LIFT  05/07/01   CYSTOSCOPY WITH  URETEROSCOPY AND STENT PLACEMENT Right 01/28/2014   Procedure: CYSTOSCOPY WITH URETEROSCOPY, BASKET RETRIVAL AND  STENT PLACEMENT;  Surgeon: [name], MD;  [facility];  Service: Urology;  Laterality: Right;   epidural injections     multiple procedures   ESOPHAGEAL DILATION  1993 and 1994   multiple times   EYE SURGERY Bilateral 03/25/10, 2012   cataract removal   HEMORRHOID SURGERY  2014   HIATAL HERNIA REPAIR  12/21/93   HOLMIUM LASER APPLICATION Right 09/13/7251   Procedure: HOLMIUM LASER APPLICATION;  Surgeon: [name], MD;  [facility];  Service: Urology;  Laterality: Right;   INGUINAL HERNIA REPAIR Right 07/15/12   Dr [name], x2   JOINT REPLACEMENT  07/25/04   right knee   JOINT REPLACEMENT  07/13/10   left knee   [place]  09/20/06   with intraoperative cholangiogram and right inguinal herniorrhaphy with mesh    LIGAMENT REPAIR Left 07/2013   shoulder   LITHOTRIPSY     x2   [place] FUSION/FORAMINOTOMY N/A 11/24/2020   Procedure: Posterior Cervical Laminectomy  Cervical three-four, Cervical four-five with lateral mass fusion;  Surgeon: [name], MD;  Location: [facility];  Service: Neurosurgery;  Laterality: N/A;   REFRACTIVE SURGERY Bilateral    Right total hip replacement  4/14   SKIN CANCER DESTRUCTION     nose, ear   TONSILLECTOMY  as child   TOTAL HIP ARTHROPLASTY Left    URETHRAL DILATION  1991   [name]   Social History   Occupational History   Occupation: firefighter    Comment: paints on the side  Tobacco Use   Smoking status: Former    Years: 11.00    Pack years: 0.00    Types: Cigarettes    Quit date: 09/11/1978    Years since quitting: 42.5   Smokeless tobacco: Never  Vaping Use   Vaping Use: Never used  Substance and Sexual Activity   Alcohol use: Not Currently    Alcohol/week: 0.0 standard drinks    Comment: rare   Drug use: No   Sexual activity: Not on file

## 2021-03-19 NOTE — Progress Notes (Signed)
Patient ID: [name] Pratt, male   DOB: July 16, 1941, 80 y.o.   MRN: [id number]     CARDIOLOGY OFFICE NOTE  Date:  03/19/2021    [name] Pratt Date of Birth: 03/02/1941 Medical Record #[id number]  PCP:  [name], NP  Cardiologist:  Johnsie Cancel    No chief complaint on file.   History of Present Illness:  80 y.o. history of HLD, GERD, left bruit , HTN and COPD. No history of CAD with normal myovue in 2008 and may of 2017. Last TTE done June 2017 with EF 55-60% AV sclerosis Duplex done March 2021 with new criteria read as plaque no stenosis bilaterally. 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 To have radiofrequency Rx of spine again by Dr Brien Few  Having more back pain with arm  weakness MRI done 06/05/20 showed significant Foraminal stenosis referred to neuro surgery Stenosis in both cervical and lumbar areas [place] 06/17/20 hurt his ribs Plain films 06/21/20 no fracture Tentative plans for C3-4 anterior fusion with Dr Saintclair Halsted 07/09/20   No cardiac symptoms   Past Medical History:  Diagnosis Date   Aortic atherosclerosis ([place]) 03/05/2020   Arthritis    Cancer ([place])    skin cancer   Carotid stenosis    a. Carotid U/S 9/48: LICA < 01%, RICA 65-53%;  b.  Carotid U/S 7/48:  RICA 27-07%; LICA 8-67%; f/u 1 year   Chest pain     Myoview in 2008 was normal.  Echo 7/09: EF 60%, normal wall motion, mild LVH, mild LAE.   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    Past Surgical History:  Procedure Laterality Date   ABDOMINAL HERNIA REPAIR  07/15/12   Dr [name]   ANTERIOR CERVICAL DECOMP/DISCECTOMY FUSION N/A 07/09/2020   Procedure: ANTERIOR CERVICAL DECOMPRESSION AND FUSION CERVICAL THREE-FOUR.;  Surgeon: [name], MD;  Location: [facility];  Service: Neurosurgery;  Laterality: N/A;  anterior   BROW LIFT  05/07/01   CYSTOSCOPY WITH URETEROSCOPY AND STENT PLACEMENT Right 01/28/2014   Procedure: CYSTOSCOPY WITH URETEROSCOPY, BASKET RETRIVAL AND  STENT PLACEMENT;  Surgeon: [name], MD;  [facility];  Service: Urology;  Laterality: Right;   epidural injections     multiple procedures   ESOPHAGEAL DILATION  1993 and 1994   multiple times   EYE SURGERY Bilateral 03/25/10, 2012   cataract removal  HEMORRHOID SURGERY  2014   HIATAL HERNIA REPAIR  12/21/93   HOLMIUM LASER APPLICATION Right 09/21/7354   Procedure: HOLMIUM LASER APPLICATION;  Surgeon: [name], MD;  [facility];  Service: Urology;  Laterality: Right;   INGUINAL HERNIA REPAIR Right 07/15/12   Dr [name], x2   JOINT REPLACEMENT  07/25/04   right knee   JOINT REPLACEMENT  07/13/10   left knee   [place]  09/20/06   with intraoperative cholangiogram and right inguinal herniorrhaphy with mesh    LIGAMENT REPAIR Left 07/2013   shoulder   LITHOTRIPSY     x2   [place] FUSION/FORAMINOTOMY N/A 11/24/2020   Procedure: Posterior Cervical Laminectomy Cervical  three-four, Cervical four-five with lateral mass fusion;  Surgeon: [name], MD;  Location: [facility];  Service: Neurosurgery;  Laterality: N/A;   REFRACTIVE SURGERY Bilateral    Right total hip replacement  4/14   SKIN CANCER DESTRUCTION     nose, ear   TONSILLECTOMY  as child   TOTAL HIP ARTHROPLASTY Left    URETHRAL DILATION  1991   [name]     Medications: Current Outpatient Medications  Medication Sig Dispense Refill   acetaminophen (TYLENOL) 650 MG CR tablet Take 650-1,300 mg by mouth See admin instructions. Take 1300 mg in the morning and 650 g at bedtime     albuterol (VENTOLIN HFA) 108 (90 Base) MCG/ACT inhaler Inhale 2 puffs into the lungs every 6 (six) hours as needed for shortness of breath. 1 each 3   Alcohol Swabs (ALCOHOL PADS) 70 % PADS 1 packet by Does not apply route 2 (two) times daily. 100 each 1   allopurinol (ZYLOPRIM) 100 MG tablet TAKE 1 TABLET TWICE DAILY 180 tablet 1   amLODipine (NORVASC) 5 MG tablet TAKE 1 TABLET EVERY DAY 90 tablet 1   Artificial Tear Solution (SOOTHE XP) SOLN Place 1 drop into both eyes 2 (two) times daily as needed (Dry eyes).     aspirin EC 81 MG tablet Take 81 mg by mouth every morning.     blood glucose meter kit and supplies Dispense based on patient and insurance preference. Use up to four times daily as directed. (FOR ICD-10 E10.9, E11.9). 1 each 0   Cholecalciferol (VITAMIN D) 50 MCG (2000 UT) CAPS Take 2,000 Units by mouth daily.     cholestyramine (QUESTRAN) 4 g packet Take 1 packet (4 g total) by mouth 2 (two) times daily as needed. 60 each 2   colchicine 0.6 MG tablet Take 1 tablet by mouth daily as needed (Gout).     Cranberry 500 MG TABS Take 500 mg by mouth daily.      cycloSPORINE (RESTASIS) 0.05 % ophthalmic emulsion Place 1 drop into both eyes 2 (two) times daily.     diclofenac Sodium (VOLTAREN) 1 % GEL Apply 2 g topically daily as needed (Hand pain).     gabapentin (NEURONTIN) 300 MG capsule Take 300 mg by mouth 2 (two)  times daily as needed.     glucose blood test strip TRUE Metrix Blood Glucose Test Strip, use as instructed 100 each 12   Homeopathic Products (THERAWORX RELIEF) FOAM Apply 1 application topically 2 (two) times daily as needed (Pain).     hydrALAZINE (APRESOLINE) 50 MG tablet Take 50 mg by mouth 2 (two) times daily.     Lancets MISC 1 each by Does not apply route  2 (two) times daily. TRUE matrix lancets 100 each 3   levothyroxine (SYNTHROID) 75 MCG tablet Take 2 tabs by mouth once daily 6 days a week and 1 tab by mouth once daily one day a week 60 tablet 3   losartan (COZAAR) 100 MG tablet Take 1 tablet (100 mg total) by mouth daily. 90 tablet 1   Menthol, Topical Analgesic, (ZIMS MAX-FREEZE EX) Apply 1 application topically daily as needed (pain).     multivitamin (THERAGRAN) per tablet Take 1 tablet by mouth daily.     nitroGLYCERIN (NITROSTAT) 0.4 MG SL tablet Place 1 tablet (0.4 mg total) under the tongue every 5 (five) minutes as needed for chest pain. 25 tablet 3   Omega-3 Fatty Acids (FISH OIL) 1200 MG CAPS Take 1,200 mg by mouth daily.     rosuvastatin (CRESTOR) 5 MG tablet Take 1 tablet (5 mg total) by mouth daily. 90 tablet 0   sodium chloride (OCEAN) 0.65 % nasal spray Place 1 spray into both nostrils at bedtime as needed for congestion.     No current facility-administered medications for this visit.    Allergies: Allergies  Allergen Reactions   Pravastatin Other (See Comments)    Muscle cramps   Sildenafil Other (See Comments)    Tachycardia     Metoclopramide    Oxycodone     hallucinations   Atenolol Other (See Comments)    bradycardia   Elastic Bandages & [Zinc] Other (See Comments)    Teflon bandages - Irritation   Metoclopramide Hcl Anxiety    Social History: The patient  reports that he quit smoking about 42 years ago. His smoking use included cigarettes. He has never used smokeless tobacco. He reports previous alcohol use. He reports that he does not use  drugs.   Family History: The patient's family history includes Cancer in his mother and another family member; Hypertension in an other family member; Osteoarthritis in an other family member.   Review of Systems: Please see the history of present illness.   Otherwise, the review of systems is positive for none.   All other systems are reviewed and negative.   Physical Exam: VS:  There were no vitals taken for this visit. [name]  BMI There is no height or weight on file to calculate BMI.  Wt Readings from Last 3 Encounters:  03/02/21 68.5 kg  01/13/21 68 kg  12/29/20 68 kg   There were no vitals taken for this visit.   Affect appropriate Healthy:  appears stated age 36: normal Neck supple with no adenopathy JVP normal left bruits no thyromegaly Lungs clear with no wheezing and good diaphragmatic motion Heart:  S1/S2 no murmur, no rub, gallop or click PMI normal Abdomen: benighn, BS positve, no tenderness, no AAA no bruit.  No HSM or HJR Distal pulses intact with no bruits No edema Neuro non-focal Skin warm and dry No muscular weakness Post bilateral TKR     LABORATORY DATA:  EKG:  12/02/19  SR rate 78  RBBB/LAFB PaC;s   Lab Results  Component Value Date   WBC 9.1 11/30/2020   HGB 12.6 (L) 11/30/2020   HCT 37.1 (L) 11/30/2020   PLT 385 11/30/2020   GLUCOSE 111 (H) 12/01/2020   CHOL 106 06/15/2020   TRIG 98 06/15/2020   HDL 38 (L) 06/15/2020   LDLCALC 50 06/15/2020   ALT 15 11/30/2020   AST 26 11/30/2020   NA 135 12/01/2020   K 3.8 12/01/2020   CL  100 12/01/2020   CREATININE 1.61 (H) 12/01/2020   BUN 29 (H) 12/01/2020   CO2 26 12/01/2020   TSH 0.46 12/29/2020   PSA 0.29 04/22/2018   INR 1.06 12/11/2012   HGBA1C 5.7 12/29/2020    BNP (last 3 results) No results for input(s): BNP in the last 8760 hours.  Other Studies Reviewed Today:  Echo 02/10/16 Myovue 01/25/16  Carotid 04/25/17   Assessment/Plan:  1. HTN -  See below on hydralazine and Hyzaar with  bump in Cr to 2.3 latter stopped and norvasc started F/u primary   2. HLD - on statin therapy.   3. Carotid disease -  Duplex 12/09/19 plaque no stenosis f/u 2 years   4. COPD with stable dyspnea.   5. CKD - baseline 1.6 ARB/Diuretic d/c   6. Neuro:  myesthenia w/u pending vision better f/u [place] for 6th nerve palsy  7. 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 OSA: f/u [place] wearing CPAP  10. Spinal Stenosis :  post anterior cervical discectomy fusion C34 Cram 07/09/20 continued back issues for repeat ablation with Dr Brien Few     Current medicines are reviewed with the patient today.  The patient does not have concerns regarding medicines other than what has been noted above.  The following changes have been made:  None    Disposition:    F/U in a year    Jenkins Rouge

## 2021-03-23 DIAGNOSIS — M47816 Spondylosis without myelopathy or radiculopathy, lumbar region: Secondary | ICD-10-CM | POA: Diagnosis not present

## 2021-03-24 ENCOUNTER — Other Ambulatory Visit: Payer: Self-pay

## 2021-03-24 ENCOUNTER — Encounter (INDEPENDENT_AMBULATORY_CARE_PROVIDER_SITE_OTHER): Payer: Self-pay | Admitting: Ophthalmology

## 2021-03-24 ENCOUNTER — Ambulatory Visit (INDEPENDENT_AMBULATORY_CARE_PROVIDER_SITE_OTHER): Payer: Medicare HMO | Admitting: Ophthalmology

## 2021-03-24 DIAGNOSIS — G4733 Obstructive sleep apnea (adult) (pediatric): Secondary | ICD-10-CM | POA: Diagnosis not present

## 2021-03-24 DIAGNOSIS — H353211 Exudative age-related macular degeneration, right eye, with active choroidal neovascularization: Secondary | ICD-10-CM

## 2021-03-24 DIAGNOSIS — Z9989 Dependence on other enabling machines and devices: Secondary | ICD-10-CM

## 2021-03-24 DIAGNOSIS — H35073 Retinal telangiectasis, bilateral: Secondary | ICD-10-CM | POA: Diagnosis not present

## 2021-03-24 MED ORDER — BEVACIZUMAB 2.5 MG/0.1ML IZ SOSY
2.5000 mg | PREFILLED_SYRINGE | INTRAVITREAL | Status: AC | PRN
  Administered 2021-03-24: 2.5 mg via INTRAVITREAL

## 2021-03-24 NOTE — Progress Notes (Signed)
03/24/2021     CHIEF COMPLAINT Patient presents for Macular Degeneration   HISTORY OF PRESENT ILLNESS: [name] Pratt is a 80 y.o. male who presents to the clinic today for:     Referring physician: [name], NP [address] [place],  [place] [address]  HISTORICAL INFORMATION:   Selected notes from the MEDICAL RECORD NUMBER    Lab Results  Component Value Date   HGBA1C 5.7 12/29/2020     CURRENT MEDICATIONS: Current Outpatient Medications (Ophthalmic Drugs)  Medication Sig   Artificial Tear Solution (SOOTHE XP) SOLN Place 1 drop into both eyes 2 (two) times daily as needed (Dry eyes).   cycloSPORINE (RESTASIS) 0.05 % ophthalmic emulsion Place 1 drop into both eyes 2 (two) times daily.   No current facility-administered medications for this visit. (Ophthalmic Drugs)   Current Outpatient Medications (Other)  Medication Sig   acetaminophen (TYLENOL) 650 MG CR tablet Take 650-1,300 mg by mouth See admin instructions. Take 1300 mg in the morning and 650 g at bedtime   albuterol (VENTOLIN HFA) 108 (90 Base) MCG/ACT inhaler Inhale 2 puffs into the lungs every 6 (six) hours as needed for shortness of breath.   Alcohol Swabs (ALCOHOL PADS) 70 % PADS 1 packet by Does not apply route 2 (two) times daily.   allopurinol (ZYLOPRIM) 100 MG tablet TAKE 1 TABLET TWICE DAILY   amLODipine (NORVASC) 5 MG tablet TAKE 1 TABLET EVERY DAY   aspirin EC 81 MG tablet Take 81 mg by mouth every morning.   blood glucose meter kit and supplies Dispense based on patient and insurance preference. Use up to four times daily as directed. (FOR ICD-10 E10.9, E11.9).   Cholecalciferol (VITAMIN D) 50 MCG (2000 UT) CAPS Take 2,000 Units by mouth daily.   cholestyramine (QUESTRAN) 4 g packet Take 1 packet (4 g total) by mouth 2 (two) times daily as needed.   colchicine 0.6 MG tablet Take 1 tablet by mouth daily as needed (Gout).   Cranberry 500 MG TABS Take 500 mg by mouth daily.     diclofenac Sodium (VOLTAREN) 1 % GEL Apply 2 g topically daily as needed (Hand pain).   gabapentin (NEURONTIN) 300 MG capsule Take 300 mg by mouth 2 (two) times daily as needed.   glucose blood test strip TRUE Metrix Blood Glucose Test Strip, use as instructed   Homeopathic Products (THERAWORX RELIEF) FOAM Apply 1 application topically 2 (two) times daily as needed (Pain).   hydrALAZINE (APRESOLINE) 50 MG tablet Take 50 mg by mouth 2 (two) times daily.   Lancets MISC 1 each by Does not apply route 2 (two) times daily. TRUE matrix lancets   levothyroxine (SYNTHROID) 75 MCG tablet Take 2 tabs by mouth once daily 6 days a week and 1 tab by mouth once daily one day a week   losartan (COZAAR) 100 MG tablet Take 1 tablet (100 mg total) by mouth daily.   Menthol, Topical Analgesic, (ZIMS MAX-FREEZE EX) Apply 1 application topically daily as needed (pain).   multivitamin (THERAGRAN) per tablet Take 1 tablet by mouth daily.   nitroGLYCERIN (NITROSTAT) 0.4 MG SL tablet Place 1 tablet (0.4 mg total) under the tongue every 5 (five) minutes as needed for chest pain.   Omega-3 Fatty Acids (FISH OIL) 1200 MG CAPS Take 1,200 mg by mouth daily.   rosuvastatin (CRESTOR) 5 MG tablet Take 1 tablet (5 mg total) by mouth daily.   sodium chloride (OCEAN) 0.65 % nasal  spray Place 1 spray into both nostrils at bedtime as needed for congestion.   No current facility-administered medications for this visit. (Other)      REVIEW OF SYSTEMS:    ALLERGIES Allergies  Allergen Reactions   Pravastatin Other (See Comments)    Muscle cramps   Sildenafil Other (See Comments)    Tachycardia     Metoclopramide    Oxycodone     hallucinations   Atenolol Other (See Comments)    bradycardia   Elastic Bandages & [Zinc] Other (See Comments)    Teflon bandages - Irritation   Metoclopramide Hcl Anxiety    PAST MEDICAL HISTORY Past Medical History:  Diagnosis Date   Aortic atherosclerosis ([place]) 03/05/2020    Arthritis    Cancer ([place])    skin cancer   Carotid stenosis    a. Carotid U/S 2/69: LICA < 48%, RICA 54-62%;  b.  Carotid U/S 7/03:  RICA 50-09%; LICA 3-81%; f/u 1 year   Chest pain     Myoview in 2008 was normal.  Echo 7/09: EF 60%, normal wall motion, mild LVH, mild LAE.   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   Past Surgical History:  Procedure Laterality Date   ABDOMINAL HERNIA REPAIR  07/15/12   Dr [name]   ANTERIOR CERVICAL DECOMP/DISCECTOMY FUSION N/A 07/09/2020   Procedure: ANTERIOR CERVICAL DECOMPRESSION AND FUSION CERVICAL THREE-FOUR.;  Surgeon: [name], MD;  Location: [facility];  Service: Neurosurgery;  Laterality: N/A;  anterior   BROW LIFT  05/07/01   CYSTOSCOPY WITH URETEROSCOPY AND STENT PLACEMENT Right 01/28/2014   Procedure: CYSTOSCOPY WITH URETEROSCOPY, BASKET RETRIVAL AND  STENT PLACEMENT;  Surgeon: [name], MD;  Location: [facility];  Service: Urology;  Laterality: Right;   epidural injections     multiple procedures   ESOPHAGEAL DILATION  1993 and 1994   multiple times   EYE SURGERY Bilateral 03/25/10, 2012   cataract removal   HEMORRHOID SURGERY  2014   HIATAL HERNIA REPAIR  12/21/93   HOLMIUM LASER APPLICATION Right 05/09/9370   Procedure: HOLMIUM LASER APPLICATION;  Surgeon: [name], MD;  Location: [facility];  Service: Urology;  Laterality:  Right;   INGUINAL HERNIA REPAIR Right 07/15/12   Dr [name], x2   JOINT REPLACEMENT  07/25/04   right knee   JOINT REPLACEMENT  07/13/10   left knee   [place]  09/20/06   with intraoperative cholangiogram and right inguinal herniorrhaphy with mesh    LIGAMENT REPAIR Left 07/2013   shoulder   LITHOTRIPSY     x2   [place] FUSION/FORAMINOTOMY N/A 11/24/2020   Procedure: Posterior Cervical Laminectomy Cervical three-four,  Cervical four-five with lateral mass fusion;  Surgeon: [name], MD;  Location: [facility];  Service: Neurosurgery;  Laterality: N/A;   REFRACTIVE SURGERY Bilateral    Right total hip replacement  4/14   SKIN CANCER DESTRUCTION     nose, ear   TONSILLECTOMY  as child   TOTAL HIP ARTHROPLASTY Left    URETHRAL DILATION  1991   [name]    FAMILY HISTORY Family History  Problem Relation Age of Onset   Cancer Mother    Hypertension Other    Cancer Other    Osteoarthritis Other     SOCIAL HISTORY Social History   Tobacco Use   Smoking status: Former    Years: 11.00    Types: Cigarettes    Quit date: 09/11/1978    Years since quitting: 42.5   Smokeless tobacco: Never  Vaping Use   Vaping Use: Never used  Substance Use Topics   Alcohol use: Not Currently    Alcohol/week: 0.0 standard drinks    Comment: rare   Drug use: No         OPHTHALMIC EXAM:  Base Eye Exam     Visual Acuity (ETDRS)       Right Left   Dist [place] 20/70 20/40   Dist ph [place] 20/50 20/30         Tonometry (Tonopen, 9:15 AM)       Right Left   Pressure 8 6         Pupils       Pupils React APD   Right PERRL Slow None   Left PERRL Slow None         Neuro/Psych     Oriented x3: Yes   Mood/Affect: Normal           Slit Lamp and Fundus Exam     External Exam       Right Left   External Normal Normal         Slit Lamp Exam       Right Left   Lids/Lashes Normal Normal    Conjunctiva/Sclera White and quiet White and quiet   Cornea Clear Clear   Anterior Chamber Deep and quiet Deep and quiet   [place]ris PI PI   Lens Clear Clear   Anterior Vitreous Normal Normal         Fundus Exam       Right Left   Posterior Vitreous Posterior vitreous detachment    Disc Normal    C/D Ratio 0.5    Macula No overt CME, angulated vessels temporally, with thickening along the p.m. bundle.    Vessels Normal    Periphery Normal             IMAGING AND PROCEDURES  Imaging and Procedures for 03/24/21  OCT, Retina - OU - Both Eyes       Right Eye Quality was good. Scan locations included subfoveal. Central Foveal Thickness: 250. Progression has been stable. Findings include abnormal foveal contour, cystoid macular edema.   Left Eye Quality was good. Scan locations included subfoveal. Central Foveal Thickness: 222. Progression has improved. Findings include abnormal foveal contour.   Notes Cavitary CME, chronic over years because of the macular telangiectasis present for longstanding with a permanent shunt vessels perifoveal  Yet OD has increasing sign of intraretinal fluid along the p.m. bundle nasally.  This could be RAP which has progressed since his last visit Of 07/28/2020. Commence intravitreal Avastin OD today  OS, slightly less CME temporally as compared to 2019, will schedule fluorescein angiography next visit to study the macular perfusion             ASSESSMENT/PLAN:  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[place]) Today at -5-week status post commencement of therapy antivegF for CNVM with     ICD-10-CM   1. 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HCC)  H35.3211 OCT, Retina - OU - Both Eyes      1.  2.  3.  Ophthalmic Meds Ordered this visit:  No orders of the defined types were placed in this encounter.      No  follow-ups on file.  There are no Patient Instructions on file for this visit.   Explained the diagnoses, plan, and follow up with the patient and they expressed understanding.  Patient expressed understanding of the importance of proper follow up care.   Clent Demark Anagha Loseke M.D. Diseases & Surgery of the Retina and Vitreous [facility] 03/24/21     Abbreviations: M myopia (nearsighted); A astigmatism; H hyperopia (farsighted); P presbyopia; Mrx spectacle prescription;  CTL contact lenses; OD right eye; OS left eye; OU both eyes  XT exotropia; ET esotropia; PEK punctate epithelial keratitis; PEE punctate epithelial erosions; DES dry eye syndrome; MGD meibomian gland dysfunction; ATs artificial tears; PFAT's preservative free artificial tears; [place] nuclear sclerotic cataract; PSC posterior subcapsular cataract; ERM epi-retinal membrane; PVD posterior vitreous detachment; RD retinal detachment; DM diabetes mellitus; DR diabetic retinopathy; NPDR non-proliferative diabetic retinopathy; PDR proliferative diabetic retinopathy; CSME clinically significant macular edema; DME diabetic macular edema; dbh dot blot hemorrhages; CWS cotton wool spot; POAG primary open angle glaucoma; C/D cup-to-disc ratio; HVF humphrey visual field; GVF goldmann visual field; OCT optical coherence tomography; IOP intraocular pressure; BRVO Branch retinal vein occlusion; CRVO central retinal vein occlusion; CRAO central retinal artery occlusion; BRAO branch retinal artery occlusion; RT retinal tear; SB scleral buckle; PPV pars plana vitrectomy; VH Vitreous hemorrhage; PRP panretinal laser photocoagulation; IVK intravitreal kenalog; VMT vitreomacular traction; MH Macular hole;  NVD neovascularization of the disc; NVE neovascularization elsewhere; AREDS age related eye disease study; ARMD age related macular degeneration; POAG primary open angle glaucoma; EBMD epithelial/anterior basement membrane dystrophy; ACIOL  anterior chamber intraocular lens; IOL intraocular lens; PCIOL posterior chamber intraocular lens; Phaco/IOL phacoemulsification with intraocular lens placement; [place] photorefractive keratectomy; LASIK laser assisted in situ keratomileusis; HTN hypertension; DM diabetes mellitus; COPD chronic obstructive pulmonary disease

## 2021-03-24 NOTE — Assessment & Plan Note (Signed)
Excellent compliance with CPAP thus diminishing nightly macular hypoxia from treated sleep apnea

## 2021-03-24 NOTE — Assessment & Plan Note (Signed)
Patient remains on CPAP with excellent compliance thus preventing progression of MAC-TEL

## 2021-03-24 NOTE — Assessment & Plan Note (Signed)
Today at -5-week status post commencement of therapy antivegF for CNVM with

## 2021-03-28 ENCOUNTER — Other Ambulatory Visit: Payer: Self-pay | Admitting: Family

## 2021-03-28 ENCOUNTER — Other Ambulatory Visit: Payer: Self-pay

## 2021-03-28 ENCOUNTER — Ambulatory Visit: Payer: Medicare HMO | Admitting: Cardiovascular Disease

## 2021-03-28 ENCOUNTER — Encounter: Payer: Self-pay | Admitting: Cardiovascular Disease

## 2021-03-28 VITALS — BP 148/62 | HR 64 | Ht 66.0 in | Wt 158.0 lb

## 2021-03-28 DIAGNOSIS — I10 Essential (primary) hypertension: Secondary | ICD-10-CM

## 2021-03-28 DIAGNOSIS — E782 Mixed hyperlipidemia: Secondary | ICD-10-CM | POA: Diagnosis not present

## 2021-03-28 DIAGNOSIS — I452 Bifascicular block: Secondary | ICD-10-CM | POA: Diagnosis not present

## 2021-03-28 DIAGNOSIS — R079 Chest pain, unspecified: Secondary | ICD-10-CM | POA: Diagnosis not present

## 2021-03-28 NOTE — Patient Instructions (Signed)
Medication Instructions:  *If you need a refill on your cardiac medications before your next appointment, please call your pharmacy*  Lab Work: If you have labs (blood work) drawn today and your tests are completely normal, you will receive your results only by: MyChart Message (if you have MyChart) OR A paper copy in the mail If you have any lab test that is abnormal or we need to change your treatment, we will call you to review the results.  Testing/Procedures: None ordered today.  Follow-Up: At [facility], you and your health needs are our priority.  As part of our continuing mission to provide you with exceptional heart care, we have created designated Provider Care Teams.  These Care Teams include your primary Cardiologist (physician) and Advanced Practice Providers (APPs -  Physician Assistants and Nurse Practitioners) who all work together to provide you with the care you need, when you need it.  We recommend signing up for the patient portal called "MyChart".  Sign up information is provided on this After Visit Summary.  MyChart is used to connect with patients for Virtual Visits (Telemedicine).  Patients are able to view lab/test results, encounter notes, upcoming appointments, etc.  Non-urgent messages can be sent to your provider as well.   To learn more about what you can do with MyChart, go to [email or web address].    Your next appointment:   12 month(s)  The format for your next appointment:   In Person  Provider:   You may see Peter Nishan, MD or one of the following Advanced Practice Providers on your designated Care Team:   Laura Ingold, NP  

## 2021-03-29 ENCOUNTER — Encounter: Payer: Self-pay | Admitting: Rehabilitative and Restorative Service Providers"

## 2021-03-29 ENCOUNTER — Ambulatory Visit: Payer: Medicare HMO | Admitting: Rehabilitative and Restorative Service Providers"

## 2021-03-29 ENCOUNTER — Other Ambulatory Visit: Payer: Self-pay | Admitting: *Deleted

## 2021-03-29 DIAGNOSIS — R2681 Unsteadiness on feet: Secondary | ICD-10-CM | POA: Diagnosis not present

## 2021-03-29 DIAGNOSIS — R29818 Other symptoms and signs involving the nervous system: Secondary | ICD-10-CM | POA: Diagnosis not present

## 2021-03-29 DIAGNOSIS — M6281 Muscle weakness (generalized): Secondary | ICD-10-CM

## 2021-03-29 DIAGNOSIS — R2689 Other abnormalities of gait and mobility: Secondary | ICD-10-CM

## 2021-03-29 MED ORDER — LEVOTHYROXINE SODIUM 75 MCG PO TABS: ORAL_TABLET | ORAL | 0 refills | Status: DC

## 2021-03-29 NOTE — Patient Instructions (Signed)
Access Code: [email or web address] URL: [email or web address] Date: 03/29/2021 Prepared by: [name]  Exercises Beginner Clam - 2 x daily - 7 x weekly - 1 sets - 10 reps Sit to Stand with Armchair - 2 x daily - 7 x weekly - 1 sets - 10 reps

## 2021-03-29 NOTE — Therapy (Signed)
[facility] [place], [place], [address] Phone: [phone]   Fax:  [phone]  Physical Therapy Evaluation  Patient Details  Name: [name] Pratt MRN: [id number] Date of Birth: [birth year] Referring Provider (PT): [name], NP   Encounter Date: 81/19/2022   PT End of Session - 03/29/21 0903     Visit Number 1    Number of Visits 12    Date for PT Re-Evaluation 05/10/21    Authorization Type [name]- requested    Authorization - Visit Number 1    Authorization - Number of Visits 12    PT Start Time [phone]    PT Stop Time 0930    PT Time Calculation (min) 43 min    Equipment Utilized During Treatment Gait belt    Activity Tolerance Patient tolerated treatment well    Behavior During Therapy [facility] for tasks assessed/performed             Past Medical History:  Diagnosis Date   Aortic atherosclerosis ([place]) 03/05/2020   Arthritis    Cancer ([place])    skin cancer   Carotid stenosis    a. Carotid U/S 6/72: LICA < 09%, RICA 47-09%;  b.  Carotid U/S 6/28:  RICA 36-62%; LICA 9-47%; f/u 1 year   Chest pain     Myoview in 2008 was normal.  Echo 7/09: EF 60%, normal wall motion, mild LVH, mild LAE.   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    Past Surgical History:   Procedure Laterality Date   ABDOMINAL HERNIA REPAIR  07/15/12   Dr [name]   ANTERIOR CERVICAL DECOMP/DISCECTOMY FUSION N/A 07/09/2020   Procedure: ANTERIOR CERVICAL DECOMPRESSION AND FUSION CERVICAL THREE-FOUR.;  Surgeon: [name], MD;  Location: [facility];  Service: Neurosurgery;  Laterality: N/A;  anterior   BROW LIFT  05/07/01   CYSTOSCOPY WITH URETEROSCOPY AND STENT PLACEMENT Right 01/28/2014   Procedure: CYSTOSCOPY WITH URETEROSCOPY, BASKET RETRIVAL AND  STENT PLACEMENT;  Surgeon: [name], MD;  Location: [facility];  Service: Urology;  Laterality: Right;   epidural injections     multiple procedures   ESOPHAGEAL DILATION  1993 and 1994   multiple times   EYE SURGERY Bilateral 03/25/10, 2012   cataract removal   HEMORRHOID SURGERY  2014   HIATAL HERNIA REPAIR  12/21/93   HOLMIUM LASER APPLICATION Right 6/54/6503   Procedure: HOLMIUM LASER APPLICATION;  Surgeon: [name], MD;  Location: [facility];  Service: Urology;  Laterality: Right;   INGUINAL HERNIA REPAIR Right 07/15/12   Dr [name], x2   JOINT REPLACEMENT  07/25/04   right knee   JOINT REPLACEMENT  07/13/10   left knee   KNEE SURGERY  [place]  09/20/06   with intraoperative cholangiogram and right inguinal herniorrhaphy with mesh    LIGAMENT REPAIR Left 07/2013   shoulder   LITHOTRIPSY     x2   [place] FUSION/FORAMINOTOMY N/A 11/24/2020   Procedure: Posterior Cervical Laminectomy Cervical three-four, Cervical four-five with lateral mass fusion;  Surgeon: [name], MD;  Location: [facility];  Service: Neurosurgery;  Laterality: N/A;   REFRACTIVE SURGERY Bilateral    Right total hip replacement  4/14   SKIN CANCER DESTRUCTION     nose, ear   TONSILLECTOMY  as child   TOTAL HIP ARTHROPLASTY Left    URETHRAL DILATION  1991   [name]    There were no vitals filed for this visit.    Subjective Assessment - 03/29/21 0850     Subjective The patient is s/p  ACDF and then cervical decompression.  He went to SNF x 20 days to d/c home with  [facility] PT in March/April timeframe. He also had some lower back nerve ablation due to chronic low back pain recently.  He is getting R trunk pain into his buttocks and leg.  He also notes weakness and occasional groin pain.  "I'm afraid if they get rid of the pain and I won't be as cautious.  I'm having trouble balancing."  He is trying to work on OT exercises due to continued numbness and tightness in the hands.  The last 2 nights he woke up with tightness.  He arrived today to therapy without a cane.    Pertinent History spondylitic myelopathy, ACDF, HTN, COPD, cervical decompression, sleep apnea    Patient Stated Goals "walking good and have good balance."    Currently in Pain? Yes    Pain Location Foot    Pain Orientation Right;Left    Pain Descriptors / Indicators Aching;Burning    Pain Type Chronic pain    Pain Onset More than a month ago    Pain Frequency Intermittent    Aggravating Factors  night time and when swelling in feet    Pain Relieving Factors unsure                [facility] PT Assessment - 03/29/21 0857       Assessment   Medical Diagnosis gait disorder    Referring Provider (PT) [name], NP    Onset Date/Surgical Date 11/14/20    Hand Dominance Right    Prior Therapy known to our clinic from prior PT      Precautions   Precautions Fall      Restrictions   Weight Bearing Restrictions No      Balance Screen   Has the patient fallen in the past 6 months Yes    How many times? 1-the other night he slid due to a rolling chair, one other episode when getting up too quick.    Has the patient had a decrease in activity level because of a fear of falling?  Yes   due to recent surgery and procedures   Is the patient reluctant to leave their home because of a fear of falling?  No      Home Environment   Living Environment Private residence    Living Arrangements Spouse/significant  other    Type of [place] to enter    Home Layout Two level    Alternate Level Stairs-Number of Steps 14  Home Equipment None      Prior Function   Level of Independence Independent      Observation/Other Assessments   Focus on Therapeutic Outcomes (FOTO)  n/a due to nature of referral      Sensation   Light Touch Impaired Detail    Light Touch Impaired Details Impaired RUE;Impaired LUE;Impaired RLE   hand numbness and tightness;   Additional Comments R glut with numbness      Coordination   Finger Nose Finger Test equal bilaterally    Heel Shin Test heel tap to shin not able to do with R leg -- weakness and dyscoordination noted      Posture/Postural Control   Posture/Postural Control Postural limitations    Posture Comments notes forward trunk lean in standing with weight through forefoot      Tone   Assessment Location --   general increase in tone noted per muscle spasms at times in the morning; no resistance in LEs with PROM     ROM / Strength   AROM / PROM / Strength AROM;Strength      AROM   Overall AROM Comments tightness with finger opposition unable to do thumb ot 5th digit; tightness in bilat IR for shoulders, dec'd hip AROM for flexion and IR/ER;  R shoulder pain intermittently-- is lifting 5 lbs for strength at home/ has h/o R biceps tear   Decreased AROM wrist flexion/extension and finger opposition      Strength   Overall Strength Deficits    Overall Strength Comments 4/5 bilat shoulder flexion and abduction, elbow flexion 4/5,    Strength Assessment Site Hip;Knee;Ankle    Right/Left Hip Right;Left    Right Hip ABduction 2/5    Left Hip Flexion 4/5    Right/Left Knee Right;Left    Right Knee Flexion 4/5    Right Knee Extension 4/5    Left Knee Flexion 5/5    Left Knee Extension 5/5    Right/Left Ankle Right;Left    Right Ankle Dorsiflexion 3/5    Left Ankle Dorsiflexion 3/5      Flexibility   Soft Tissue Assessment /Muscle  Length yes    Hamstrings greater tightness R than L      Transfers   Transfers Sit to Stand;Stand to Sit      Ambulation/Gait   Ambulation/Gait Yes    Ambulation/Gait Assistance 5: Supervision    Ambulation Distance (Feet) 150 Feet    Gait Pattern Step-through pattern;Decreased dorsiflexion - right;Decreased dorsiflexion - left;Trendelenburg    Gait velocity 2.35 ft/sec      Standardized Balance Assessment   Standardized Balance Assessment Berg Balance Test;Timed Up and Go Test      Berg Balance Test   Sit to Stand Able to stand  independently using hands    Standing Unsupported Able to stand safely 2 minutes    Sitting with Back Unsupported but Feet Supported on Floor or Stool Able to sit safely and securely 2 minutes    Stand to Sit Sits safely with minimal use of hands    Transfers Able to transfer safely, definite need of hands    Standing Unsupported with Eyes Closed Able to stand 10 seconds safely    Standing Unsupported with Feet Together Able to place feet together independently and stand 1 minute safely    From Standing, Reach Forward with Outstretched Arm Can reach forward >12 cm safely (5")    From Standing Position, Pick up Object from Floor Able to pick up  shoe safely and easily    From Standing Position, Turn to Look Behind Over each Shoulder Turn sideways only but maintains balance    Turn 360 Degrees Needs close supervision or verbal cueing    Standing Unsupported, Alternately Place Feet on Step/Stool Able to complete >2 steps/needs minimal assist    Standing Unsupported, One Foot in Front Able to take small step independently and hold 30 seconds    Standing on One Leg Unable to try or needs assist to prevent fall    Total Score 39    Berg comment: 39/56      Timed Up and Go Test   TUG --   16.86 seconds                       Objective measurements completed on examination: See above findings.       [facility] Adult PT Treatment/Exercise - 03/29/21  0857       Exercises   Exercises Knee/Hip      Knee/Hip Exercises: Sidelying   Clams x 10 reps                    PT Education - 03/29/21 0931     Education Details HEP    Person(s) Educated Patient    Methods Explanation;Demonstration    Comprehension Returned demonstration;Verbal cues required;Verbalized understanding;Need further instruction                 PT Long Term Goals - 03/29/21 1129       PT LONG TERM GOAL #1   Title The patient will be indep with HEP.    Time 6    Period Weeks    Target Date 05/10/21      PT LONG TERM GOAL #2   Title The patient will improve Berg balance scale from 39/56 up to 45/56.    Time 6    Period Weeks    Target Date 05/10/21      PT LONG TERM GOAL #3   Title The patient will improve gait speed from 2.35 ft/sec to > or equal to 2.62 ft/sec to demo improved community moiblity.    Time 6    Period Weeks    Target Date 05/10/21      PT LONG TERM GOAL #4   Title The patient will decrease TUG from 16.86 seconds to < or equal to 14 seconds to demo improving mobility.    Time 6    Period Weeks    Target Date 05/10/21                    Plan - 03/29/21 1130     Clinical Impression Statement The patient is an 80 yo male presenting to OP physical therapy due to gait disorder.  He has complex medical history with surgeries for cervical decompression and chronic low back pain.  He presents with weakness R hip, bilateral ankles, and bilateral UEs.  Patient also has general dec'd flexibility in LEs, pain with R hip palpation over greater trochanter, abnormality of gait, and fall risk per Merrilee Jansky and Tug.  PT to address deficits to promote improved mobility.    Personal Factors and Comorbidities Comorbidity 3+;Time since onset of injury/illness/exacerbation    Comorbidities HTN, COPD, spondylitis myelopathy, falls    Examination-Activity Limitations Squat;Stairs;Locomotion Level    Examination-Participation  Restrictions Yard Work;Community Activity    Stability/Clinical Decision Making Evolving/Moderate complexity    Clinical Decision Making  Moderate    Rehab Potential Good    PT Frequency 2x / week    PT Duration 6 weeks    PT Treatment/Interventions ADLs/Self Care Home Management;Patient/family education;Balance training;Neuromuscular re-education;Gait training;Therapeutic activities;Therapeutic exercise;Functional mobility training;Taping;Manual techniques    PT Next Visit Plan review current HEP from home health and further develop HEP; balance, standing posture and awareness of COG, gait training, R hip strengthening    PT Home Exercise Plan Access Code: [email or web address]    Consulted and Agree with Plan of Care Patient             Patient will benefit from skilled therapeutic intervention in order to improve the following deficits and impairments:  Pain, Decreased activity tolerance, Decreased balance, Decreased strength, Decreased range of motion, Impaired flexibility, Impaired tone, Increased fascial restricitons  Visit Diagnosis: Muscle weakness (generalized)  Other abnormalities of gait and mobility  Other symptoms and signs involving the nervous system  Unsteadiness on feet     Problem List Patient Active Problem List   Diagnosis Date Noted   Diarrhea 03/02/2021   Lumbar radiculopathy 03/02/2021   Gait disorder 03/02/2021   Hip pain 03/02/2021   Incontinence of feces 03/02/2021   Situational depression 03/02/2021   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[place]) 02/09/2021   Visual hallucinations 11/30/2020   Cervical myelopathy ([place]) 11/24/2020   Cystoid macular edema of both eyes 07/28/2020   Status post cervical spinal fusion 07/09/2020   History of total bilateral knee replacement 07/08/2020   Aortic atherosclerosis ([place]) 03/05/2020   Emphysema, unspecified ([place]) 03/05/2020   Heme positive stool 02/29/2020    Leg cramps 11/13/2019   Tibialis anterior tendon tear, nontraumatic 11/13/2019   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   CPAP (continuous positive airway pressure) dependence 03/25/2018   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   Acute idiopathic gout of hand 03/25/2018   Erectile dysfunction 06/20/2017   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 03/06/2017   Peripheral neuropathy 06/19/2016   Hearing loss 02/02/2016   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   Allergic rhinitis 02/02/2016   Atypical chest pain 11/24/2015   Preventative health care 11/24/2015   Onychomycosis 06/30/2015   Degenerative disc disease, lumbar 06/30/2015   Other allergic rhinitis 02/18/2015   Deviated nasal septum 02/18/2015   OSA on CPAP 02/18/2015   RLS (restless legs syndrome) 02/18/2015   GERD (gastroesophageal reflux disease) 09/18/2014   Hyperglycemia 03/03/2014   Sleep apnea with use of continuous positive airway pressure (CPAP) 07/15/2013   Carotid stenosis 02/24/2013   Nonspecific abnormal electrocardiogram (ECG) (EKG) 01/06/2013   DJD (degenerative joint disease) of hip 12/24/2012   Lower GI bleed 12/12/2012   Dyspnea 06/12/2011   Hypothyroidism 05/13/2009   HYPERCHOLESTEROLEMIA 05/13/2009   Hyperlipidemia 05/13/2009   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   Essential hypertension 05/13/2009   INGUINAL HERNIA 05/13/2009   HIATAL HERNIA 05/13/2009   NEPHROLITHIASIS 05/13/2009   ROSACEA 05/13/2009    Tannia Contino, PT 03/29/2021, 11:37 AM  [facility] [place], [place], [address] Phone: [phone]   Fax:  [phone]  Name: MANAN OLMO MRN: [id number] Date of Birth: Jun 17, 1941

## 2021-03-30 ENCOUNTER — Ambulatory Visit: Payer: Medicare HMO | Admitting: Family

## 2021-03-30 DIAGNOSIS — M5412 Radiculopathy, cervical region: Secondary | ICD-10-CM | POA: Diagnosis not present

## 2021-03-30 DIAGNOSIS — D649 Anemia, unspecified: Secondary | ICD-10-CM | POA: Diagnosis not present

## 2021-03-30 DIAGNOSIS — N2581 Secondary hyperparathyroidism of renal origin: Secondary | ICD-10-CM | POA: Diagnosis not present

## 2021-03-30 DIAGNOSIS — N1832 Chronic kidney disease, stage 3b: Secondary | ICD-10-CM | POA: Diagnosis not present

## 2021-03-30 DIAGNOSIS — I129 Hypertensive chronic kidney disease with stage 1 through stage 4 chronic kidney disease, or unspecified chronic kidney disease: Secondary | ICD-10-CM | POA: Diagnosis not present

## 2021-04-01 ENCOUNTER — Encounter: Payer: Medicare HMO | Admitting: Physical Therapy

## 2021-04-01 ENCOUNTER — Telehealth: Payer: Self-pay | Admitting: Physical Therapy

## 2021-04-01 NOTE — Telephone Encounter (Signed)
Patient did not show for PT appt.  Called patient's home number and briefly spoke to his wife; she stated he was not at home but that he was looking forward to his appointment.   Called patient's cell phone. Spoke with patient and informed him he missed today's PT appt.  He stated that he thought his appt was tomorrow. He apologized for missing. Confirmed next upcoming appt on Tues with patient.   Kerin Perna, PTA 04/01/21 10:35 AM

## 2021-04-05 ENCOUNTER — Ambulatory Visit: Payer: Medicare HMO | Admitting: Rehabilitative and Restorative Service Providers"

## 2021-04-05 ENCOUNTER — Other Ambulatory Visit: Payer: Self-pay

## 2021-04-05 ENCOUNTER — Encounter: Payer: Self-pay | Admitting: Rehabilitative and Restorative Service Providers"

## 2021-04-05 DIAGNOSIS — R2681 Unsteadiness on feet: Secondary | ICD-10-CM | POA: Diagnosis not present

## 2021-04-05 DIAGNOSIS — R2689 Other abnormalities of gait and mobility: Secondary | ICD-10-CM | POA: Diagnosis not present

## 2021-04-05 DIAGNOSIS — M6281 Muscle weakness (generalized): Secondary | ICD-10-CM | POA: Diagnosis not present

## 2021-04-05 DIAGNOSIS — R29818 Other symptoms and signs involving the nervous system: Secondary | ICD-10-CM

## 2021-04-05 NOTE — Therapy (Signed)
[place], [place], [address] Phone: [phone]   Fax:  [phone]  Physical Therapy Treatment  Patient Details  Name: [name] Pratt MRN: [id number] Date of Birth: 08-23-1941 Referring Provider (PT): [name], NP   Encounter Date: 04/05/2021   PT End of Session - 04/05/21 1109     Visit Number 2    Number of Visits 12    Date for PT Re-Evaluation 05/10/21    Authorization Type [name]- requested/ approved thru 8/30    Authorization - Visit Number 2    Authorization - Number of Visits 12    PT Start Time 1105    PT Stop Time 1145    PT Time Calculation (min) 40 min    Equipment Utilized During Treatment Gait belt    Activity Tolerance Patient tolerated treatment well    Behavior During Therapy [facility] for tasks assessed/performed             Past Medical History:  Diagnosis Date   Aortic atherosclerosis ([place]) 03/05/2020   Arthritis    Cancer ([place])    skin cancer   Carotid stenosis    a. Carotid U/S [id number]: LICA < [id number], RICA 0000000;  b.  Carotid U/S 0000000:  RICA 123456; LICA [id number]; f/u 1 year   Chest pain     Myoview in 2008 was normal.  Echo 7/09: EF 60%, normal wall motion, mild LVH, mild LAE.   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    Past  Surgical History:  Procedure Laterality Date   ABDOMINAL HERNIA REPAIR  07/15/12   Dr [name]   ANTERIOR CERVICAL DECOMP/DISCECTOMY FUSION N/A 07/09/2020   Procedure: ANTERIOR CERVICAL DECOMPRESSION AND FUSION CERVICAL THREE-FOUR.;  Surgeon: [name], MD;  Location: [facility];  Service: Neurosurgery;  Laterality: N/A;  anterior   BROW LIFT  05/07/01   CYSTOSCOPY WITH URETEROSCOPY AND STENT PLACEMENT Right 01/28/2014   Procedure: CYSTOSCOPY WITH URETEROSCOPY, BASKET RETRIVAL AND  STENT PLACEMENT;  Surgeon: [name], MD;  [facility];  Service: Urology;  Laterality: Right;   epidural injections     multiple procedures   ESOPHAGEAL DILATION  1993 and 1994   multiple times   EYE SURGERY Bilateral 03/25/10, 2012   cataract removal   HEMORRHOID SURGERY  2014   HIATAL HERNIA REPAIR  12/21/93   HOLMIUM LASER APPLICATION Right [id number]   Procedure: HOLMIUM LASER APPLICATION;  Surgeon: [name], MD;  [facility];  Service: Urology;  Laterality: Right;   INGUINAL HERNIA REPAIR Right 07/15/12   Dr [name], x2   JOINT REPLACEMENT  07/25/04   right knee   JOINT REPLACEMENT  07/13/10   left knee  [place]   LAPAROSCOPIC CHOLECYSTECTOMY  09/20/06   with intraoperative cholangiogram and right inguinal herniorrhaphy with mesh    LIGAMENT REPAIR Left 07/2013   shoulder   LITHOTRIPSY     x2   [place] FUSION/FORAMINOTOMY N/A 11/24/2020   Procedure: Posterior Cervical Laminectomy Cervical three-four, Cervical four-five with lateral mass fusion;  Surgeon: [name], MD;  Location: [facility];  Service: Neurosurgery;  Laterality: N/A;   REFRACTIVE SURGERY Bilateral    Right total hip replacement  4/14   SKIN CANCER DESTRUCTION     nose, ear   TONSILLECTOMY  as child   TOTAL HIP ARTHROPLASTY Left    URETHRAL DILATION  1991   [name]    There were no vitals filed for this visit.   Subjective Assessment - 04/05/21 1106     Subjective The  patient had a fall since his evaluation.  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   He decided to hire [employer] to spray his trees.  His back is still more sore and he continues with R sided weakness.    Pertinent History spondylitic myelopathy, ACDF, HTN, COPD, cervical decompression, sleep apnea    Patient Stated Goals "walking good and have good balance."    Currently in Pain? Yes    Pain Score 3    pain worse in morning "right now, not too bad"   Pain Location Back    Pain Orientation Right    Pain Descriptors / Indicators Aching;Tightness    Pain Type Chronic pain    Pain Onset More than a month ago    Aggravating Factors  feet hurt worse when swollen at night; back hurts worse in the morning    Pain Relieving Factors unsure                [facility] PT Assessment - 04/05/21 1121       Assessment   Medical Diagnosis gait disorder    Referring Provider (PT) [name], NP    Onset Date/Surgical Date 11/14/20    Hand Dominance Right                           [facility] Adult PT Treatment/Exercise - 04/05/21 1122       Transfers   Transfers Stand to Sit;Sit to Stand    Sit to Stand 6: Modified independent (Device/Increase time)    Stand to Sit 6: Modified independent (Device/Increase time)      Ambulation/Gait   Ambulation/Gait Yes    Ambulation/Gait Assistance 5: Supervision    Ambulation/Gait Assistance Details changed hands to use SPC in the L UE    Ambulation Distance (Feet) 240 Feet      Self-Care   Self-Care Other Self-Care Comments    Other Self-Care Comments  discussed safety in his yard and strategies to reduce fall risk      Exercises   Exercises Knee/Hip      Knee/Hip Exercises: Stretches   Active Hamstring Stretch Right;Left;1 rep;30 seconds    Passive Hamstring Stretch Right;Left;1 rep;60 seconds    Gastroc Stretch Right;Left;1 rep;30 seconds    Other Knee/Hip  Stretches supine lumbar trunk rotation      Knee/Hip Exercises: Standing   Heel Raises Both;20 reps    Heel Raises Limitations dec'ing UE support from counter; individual ankle DF in standing with  UE support    Knee Flexion Strengthening;Right;Left;10 reps    Hip Flexion Stengthening;Right;Left;20 reps    Hip Flexion Limitations with countertop support L side    Hip Extension Stengthening;Right;Left;10 reps    Functional Squat 10 reps      Knee/Hip Exercises: Supine   Bridges Strengthening;Both;5 reps    Bridges with Clamshell Strengthening;Both;10 reps   gets HS cramps R and L with bridge with clamshell     Knee/Hip Exercises: Sidelying   Hip ABduction Limitations unable to lift against gravity    Clams x 10 reps                    PT Education - 04/05/21 1514     Education Details modified HEP    Person(s) Educated Patient    Methods Explanation;Demonstration;Handout    Comprehension Verbalized understanding;Returned demonstration                 PT Long Term Goals - 03/29/21 1129       PT LONG TERM GOAL #1   Title The patient will be indep with HEP.    Time 6    Period Weeks    Target Date 05/10/21      PT LONG TERM GOAL #2   Title The patient will improve Berg balance scale from 39/56 up to 45/56.    Time 6    Period Weeks    Target Date 05/10/21      PT LONG TERM GOAL #3   Title The patient will improve gait speed from 2.35 ft/sec to > or equal to 2.62 ft/sec to demo improved community moiblity.    Time 6    Period Weeks    Target Date 05/10/21      PT LONG TERM GOAL #4   Title The patient will decrease TUG from 16.86 seconds to < or equal to 14 seconds to demo improving mobility.    Time 6    Period Weeks    Target Date 05/10/21                   Plan - 04/05/21 1138     Clinical Impression Statement The patient has had another fall since evaluation.  PT and patient discussed safety on unlevel surfaces to reduce falls.  PT  progressing HEP working on LE strengthening, core stability, and balance.    PT Treatment/Interventions ADLs/Self Care Home Management;Patient/family education;Balance training;Neuromuscular re-education;Gait training;Therapeutic activities;Therapeutic exercise;Functional mobility training;Taping;Manual techniques    PT Next Visit Plan balance, standing posture and awareness of COG, gait training, R hip strengthening, LE strengthening    PT Home Exercise Plan Access Code: [email or web address]    Consulted and Agree with Plan of Care Patient             Patient will benefit from skilled therapeutic intervention in order to improve the following deficits and impairments:     Visit Diagnosis: Muscle weakness (generalized)  Other abnormalities of gait and mobility  Other symptoms and signs involving the nervous system  Unsteadiness on feet     Problem List Patient Active Problem List   Diagnosis Date Noted   Diarrhea 03/02/2021   Lumbar radiculopathy 03/02/2021   Gait disorder 03/02/2021   Hip pain 03/02/2021   Incontinence of feces 03/02/2021   Situational depression 03/02/2021   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[place]) 02/09/2021   Visual hallucinations 11/30/2020   Cervical myelopathy ([place]) 11/24/2020   Cystoid  macular edema of both eyes 07/28/2020   Status post cervical spinal fusion 07/09/2020   History of total bilateral knee replacement 07/08/2020   Aortic atherosclerosis ([place]) 03/05/2020   Emphysema, unspecified ([place]) 03/05/2020   Heme positive stool 02/29/2020   Leg cramps 11/13/2019   Tibialis anterior tendon tear, nontraumatic 11/13/2019   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   CPAP (continuous positive airway pressure) dependence 03/25/2018   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   Acute idiopathic gout of hand 03/25/2018   Erectile dysfunction 06/20/2017   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 03/06/2017   Peripheral neuropathy 06/19/2016   Hearing loss 02/02/2016   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   Allergic rhinitis 02/02/2016   Atypical chest pain 11/24/2015   Preventative health care 11/24/2015   Onychomycosis 06/30/2015   Degenerative disc disease, lumbar 06/30/2015   Other allergic rhinitis 02/18/2015   Deviated nasal septum 02/18/2015   OSA on CPAP 02/18/2015   RLS (restless legs syndrome) 02/18/2015   GERD (gastroesophageal reflux disease) 09/18/2014   Hyperglycemia 03/03/2014   Sleep apnea with use of continuous positive airway pressure (CPAP) 07/15/2013   Carotid stenosis 02/24/2013   Nonspecific abnormal electrocardiogram (ECG) (EKG) 01/06/2013   DJD (degenerative joint disease) of hip 12/24/2012   Lower GI bleed 12/12/2012   Dyspnea 06/12/2011   Hypothyroidism 05/13/2009   HYPERCHOLESTEROLEMIA 05/13/2009   Hyperlipidemia 05/13/2009   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   Essential hypertension 05/13/2009   INGUINAL HERNIA 05/13/2009   HIATAL HERNIA 05/13/2009   NEPHROLITHIASIS 05/13/2009   ROSACEA 05/13/2009    Heriberto Stmartin, PT 04/05/2021, 3:18 PM  [place] [address] [place], [place], [address] Phone: [phone]   Fax:  [phone]  Name: [name] Pratt MRN: [id number] Date of Birth: Jul 22, 1941

## 2021-04-05 NOTE — Patient Instructions (Signed)
Access Code: [email or web address] URL: [email or web address] Date: 04/05/2021 Prepared by: [name]  Exercises Sit to Stand with Armchair - 2 x daily - 7 x weekly - 1 sets - 10 reps Heel Raises with Counter Support - 2 x daily - 7 x weekly - 1 sets - 20 reps Heel Toe Raises with Counter Support - 2 x daily - 7 x weekly - 1 sets - 10 reps Side Stepping with Counter Support - 2 x daily - 7 x weekly - 1 sets - 10 reps Standing Marching - 2 x daily - 7 x weekly - 1 sets - 10 reps Seated Hamstring Stretch - 2 x daily - 7 x weekly - 1 sets - 3 reps - 30 seconds hold

## 2021-04-07 ENCOUNTER — Other Ambulatory Visit: Payer: Self-pay

## 2021-04-07 ENCOUNTER — Ambulatory Visit: Payer: Medicare HMO | Admitting: Rehabilitative and Restorative Service Providers"

## 2021-04-07 DIAGNOSIS — R2681 Unsteadiness on feet: Secondary | ICD-10-CM

## 2021-04-07 DIAGNOSIS — M6281 Muscle weakness (generalized): Secondary | ICD-10-CM | POA: Diagnosis not present

## 2021-04-07 DIAGNOSIS — N401 Enlarged prostate with lower urinary tract symptoms: Secondary | ICD-10-CM | POA: Diagnosis not present

## 2021-04-07 DIAGNOSIS — R29818 Other symptoms and signs involving the nervous system: Secondary | ICD-10-CM

## 2021-04-07 DIAGNOSIS — R2689 Other abnormalities of gait and mobility: Secondary | ICD-10-CM | POA: Diagnosis not present

## 2021-04-07 DIAGNOSIS — N3941 Urge incontinence: Secondary | ICD-10-CM | POA: Diagnosis not present

## 2021-04-07 NOTE — Therapy (Signed)
[place], [place], [address] Phone: [phone]   Fax:  [phone]  Physical Therapy Treatment  Patient Details  Name: [name] Pratt MRN: [id number] Date of Birth: September 25, 1940 Referring Provider (PT): [name], NP   Encounter Date: 04/07/2021   PT End of Session - 04/07/21 0903     Visit Number 3    Number of Visits 12    Date for PT Re-Evaluation 05/10/21    Authorization Type [name]- requested/ approved thru 8/30    Authorization - Visit Number 3    Authorization - Number of Visits 12    PT Start Time 0901    PT Stop Time 0940    PT Time Calculation (min) 39 min    Equipment Utilized During Treatment Gait belt    Activity Tolerance Patient tolerated treatment well    Behavior During Therapy [facility] for tasks assessed/performed             Past Medical History:  Diagnosis Date   Aortic atherosclerosis ([place]) 03/05/2020   Arthritis    Cancer ([place])    skin cancer   Carotid stenosis    a. Carotid U/S [id number]: LICA < [id number], RICA 0000000;  b.  Carotid U/S 0000000:  RICA 123456; LICA [id number]; f/u 1 year   Chest pain     Myoview in 2008 was normal.  Echo 7/09: EF 60%, normal wall motion, mild LVH, mild LAE.   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    Past  Surgical History:  Procedure Laterality Date   ABDOMINAL HERNIA REPAIR  07/15/12   Dr [name]   ANTERIOR CERVICAL DECOMP/DISCECTOMY FUSION N/A 07/09/2020   Procedure: ANTERIOR CERVICAL DECOMPRESSION AND FUSION CERVICAL THREE-FOUR.;  Surgeon: [name], MD;  Location: [place];  Service: Neurosurgery;  Laterality: N/A;  anterior   BROW LIFT  05/07/01   CYSTOSCOPY WITH URETEROSCOPY AND STENT PLACEMENT Right 01/28/2014   Procedure: CYSTOSCOPY WITH URETEROSCOPY, BASKET RETRIVAL AND  STENT PLACEMENT;  Surgeon: [name], MD;  [facility];  Service: Urology;  Laterality: Right;   epidural injections     multiple procedures   ESOPHAGEAL DILATION  1993 and 1994   multiple times   EYE SURGERY Bilateral 03/25/10, 2012   cataract removal   HEMORRHOID SURGERY  2014   HIATAL HERNIA REPAIR  12/21/93   HOLMIUM LASER APPLICATION Right [id number]   Procedure: HOLMIUM LASER APPLICATION;  Surgeon: [name], MD;  [facility];  Service: Urology;  Laterality: Right;   INGUINAL HERNIA REPAIR Right 07/15/12   Dr [name], x2   JOINT REPLACEMENT  07/25/04   right knee   JOINT REPLACEMENT  07/13/10   left knee  [place]   LAPAROSCOPIC CHOLECYSTECTOMY  09/20/06   with intraoperative cholangiogram and right inguinal herniorrhaphy with mesh    LIGAMENT REPAIR Left 07/2013   shoulder   LITHOTRIPSY     x2   [place] FUSION/FORAMINOTOMY N/A 11/24/2020   Procedure: Posterior Cervical Laminectomy Cervical three-four, Cervical four-five with lateral mass fusion;  Surgeon: [name], MD;  Location: [facility];  Service: Neurosurgery;  Laterality: N/A;   REFRACTIVE SURGERY Bilateral    Right total hip replacement  4/14   SKIN CANCER DESTRUCTION     nose, ear   TONSILLECTOMY  as child   TOTAL HIP ARTHROPLASTY Left    URETHRAL DILATION  1991   [name]    There were no vitals filed for this visit.   Subjective Assessment - 04/07/21 0900     Subjective The  patient arrives early for PT.  He notes R hip pain worse today    Pertinent History spondylitic myelopathy, ACDF, HTN, COPD, cervical decompression, sleep apnea    Patient Stated Goals "walking good and have good balance."    Currently in Pain? Yes    Pain Score 4     Pain Location Hip    Pain Orientation Right    Pain Descriptors / Indicators Aching;Discomfort    Pain Type Chronic pain    Pain Onset More than a month ago    Pain Frequency Intermittent    Aggravating Factors  R hip more sore in the morning when stiff    Pain Relieving Factors unsure                [facility] PT Assessment - 04/07/21 0905       Assessment   Medical Diagnosis gait disorder    Referring Provider (PT) [name], NP    Onset Date/Surgical Date 11/14/20    Hand Dominance Right                           [facility] Adult PT Treatment/Exercise - 04/07/21 0905       Ambulation/Gait   Ambulation/Gait Yes    Ambulation/Gait Assistance 4: Min guard    Ambulation Distance (Feet) 250 Feet    Gait Pattern Step-through pattern;Decreased dorsiflexion - right;Decreased dorsiflexion - left;Trendelenburg    Gait Comments gait with cues to even stride length with CGA for safety with 2 episodes of foot catching      Neuro Re-ed    Neuro Re-ed Details  Standing balance with CGA with hip strategies performing wall bumps, compliant surface standing with proactive balance/reaching.  Lateral stepping on/off of compliant surfaces, rocker board standing with UE support and CGA.  Lateral rocking and ant/posterior rocking      Exercises   Exercises Knee/Hip      Knee/Hip Exercises: Stretches   Active Hamstring Stretch Right;Left;1 rep;30 seconds    Hip Flexor Stretch Right;Left;1 rep;60 seconds    Other Knee/Hip Stretches supine lumbar trunk rotation      Knee/Hip Exercises: Seated   Other Seated Knee/Hip Exercises ankle DF with red band x 10 reps      Knee/Hip Exercises: Supine   Bridges  Strengthening;Both;10 reps    Bridges Limitations attempted bridge iwth marching, but it increases HS cramping    Straight Leg Raises Strengthening;Right;Left;10 reps    Other Supine Knee/Hip Exercises moving R leg on/off mat for hip flexion strengthening    Other Supine Knee/Hip  Exercises Hooklying clamshells with red band x 10 reps; supine marching x 10 reps                         PT Long Term Goals - 03/29/21 1129       PT LONG TERM GOAL #1   Title The patient will be indep with HEP.    Time 6    Period Weeks    Target Date 05/10/21      PT LONG TERM GOAL #2   Title The patient will improve Berg balance scale from 39/56 up to 45/56.    Time 6    Period Weeks    Target Date 05/10/21      PT LONG TERM GOAL #3   Title The patient will improve gait speed from 2.35 ft/sec to > or equal to 2.62 ft/sec to demo improved community moiblity.    Time 6    Period Weeks    Target Date 05/10/21      PT LONG TERM GOAL #4   Title The patient will decrease TUG from 16.86 seconds to < or equal to 14 seconds to demo improving mobility.    Time 6    Period Weeks    Target Date 05/10/21                   Plan - 04/07/21 1320     Clinical Impression Statement The patient had a reduction in pain with therapy today.  PT focused on hip strengthening, balance, mobility.  Continue to progress to LTGs.    PT Treatment/Interventions ADLs/Self Care Home Management;Patient/family education;Balance training;Neuromuscular re-education;Gait training;Therapeutic activities;Therapeutic exercise;Functional mobility training;Taping;Manual techniques    PT Next Visit Plan balance, standing posture and awareness of COG, gait training, R hip strengthening, LE strengthening    PT Home Exercise Plan Access Code: [email or web address]    Consulted and Agree with Plan of Care Patient             Patient will benefit from skilled therapeutic intervention in order to improve the following deficits  and impairments:     Visit Diagnosis: Muscle weakness (generalized)  Other abnormalities of gait and mobility  Other symptoms and signs involving the nervous system  Unsteadiness on feet     Problem List Patient Active Problem List   Diagnosis Date Noted   Diarrhea 03/02/2021   Lumbar radiculopathy 03/02/2021   Gait disorder 03/02/2021   Hip pain 03/02/2021   Incontinence of feces 03/02/2021   Situational depression 03/02/2021   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[place]) 02/09/2021   Visual hallucinations 11/30/2020   Cervical myelopathy ([place]) 11/24/2020   Cystoid macular edema of both eyes 07/28/2020   Status post cervical spinal fusion 07/09/2020   History of total bilateral knee replacement 07/08/2020   Aortic atherosclerosis ([place]) 03/05/2020   Emphysema, unspecified ([place]) 03/05/2020   Heme positive stool 02/29/2020   Leg cramps 11/13/2019   Tibialis anterior tendon tear, nontraumatic 11/13/2019   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   CPAP (continuous positive airway pressure) dependence 03/25/2018   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   Acute idiopathic gout of hand 03/25/2018   Erectile dysfunction 06/20/2017   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 03/06/2017   Peripheral neuropathy 06/19/2016   Hearing loss 02/02/2016   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   Allergic rhinitis 02/02/2016   Atypical chest pain 11/24/2015  Preventative health care 11/24/2015   Onychomycosis 06/30/2015   Degenerative disc disease, lumbar 06/30/2015   Other allergic rhinitis 02/18/2015   Deviated nasal septum 02/18/2015   OSA on CPAP 02/18/2015   RLS (restless legs syndrome) 02/18/2015   GERD (gastroesophageal reflux disease) 09/18/2014   Hyperglycemia 03/03/2014   Sleep apnea with use of continuous positive airway pressure (CPAP) 07/15/2013   Carotid stenosis 02/24/2013    Nonspecific abnormal electrocardiogram (ECG) (EKG) 01/06/2013   DJD (degenerative joint disease) of hip 12/24/2012   Lower GI bleed 12/12/2012   Dyspnea 06/12/2011   Hypothyroidism 05/13/2009   HYPERCHOLESTEROLEMIA 05/13/2009   Hyperlipidemia 05/13/2009   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   Essential hypertension 05/13/2009   INGUINAL HERNIA 05/13/2009   HIATAL HERNIA 05/13/2009   NEPHROLITHIASIS 05/13/2009   ROSACEA 05/13/2009    [place], PT 04/07/2021, 1:21 PM  [facility] [place] [address] [place], [place], [address] Phone: [phone]   Fax:  [phone]  Name: MATHEW BRUSTAD MRN: [id number] Date of Birth: July 13, 1941

## 2021-04-08 DIAGNOSIS — N1832 Chronic kidney disease, stage 3b: Secondary | ICD-10-CM | POA: Diagnosis not present

## 2021-04-11 ENCOUNTER — Ambulatory Visit (INDEPENDENT_AMBULATORY_CARE_PROVIDER_SITE_OTHER): Payer: Medicare HMO | Admitting: Family

## 2021-04-11 ENCOUNTER — Telehealth: Payer: Self-pay | Admitting: Family

## 2021-04-11 ENCOUNTER — Other Ambulatory Visit: Payer: Self-pay

## 2021-04-11 VITALS — BP 133/56 | HR 59 | Temp 98.5°F | Resp 16 | Wt 158.0 lb

## 2021-04-11 DIAGNOSIS — R0981 Nasal congestion: Secondary | ICD-10-CM | POA: Insufficient documentation

## 2021-04-11 DIAGNOSIS — I10 Essential (primary) hypertension: Secondary | ICD-10-CM | POA: Diagnosis not present

## 2021-04-11 DIAGNOSIS — M1A0720 Idiopathic chronic gout, left ankle and foot, without tophus (tophi): Secondary | ICD-10-CM

## 2021-04-11 DIAGNOSIS — M5416 Radiculopathy, lumbar region: Secondary | ICD-10-CM

## 2021-04-11 DIAGNOSIS — A0472 Enterocolitis due to Clostridium difficile, not specified as recurrent: Secondary | ICD-10-CM | POA: Insufficient documentation

## 2021-04-11 DIAGNOSIS — E039 Hypothyroidism, unspecified: Secondary | ICD-10-CM

## 2021-04-11 DIAGNOSIS — C801 Malignant (primary) neoplasm, unspecified: Secondary | ICD-10-CM | POA: Insufficient documentation

## 2021-04-11 HISTORY — DX: Enterocolitis due to Clostridium difficile, not specified as recurrent: A04.72

## 2021-04-11 HISTORY — DX: Nasal congestion: R09.81

## 2021-04-11 NOTE — Assessment & Plan Note (Signed)
>>  ASSESSMENT AND PLAN FOR BENIGN ESSENTIAL HYPERTENSION WRITTEN ON 04/11/2021  9:18 AM BY O'SULLIVAN, Rochele Lueck, NP  BP Readings from Last 3 Encounters:  04/11/21 (!) 133/56  03/28/21 (!) 148/62  03/02/21 134/62   bp stable. Continue losartan 100mg .

## 2021-04-11 NOTE — Assessment & Plan Note (Signed)
Lab Results  Component Value Date   TSH 0.46 12/29/2020   TSH stable. Continue synthroid 72mg once daily.

## 2021-04-11 NOTE — Assessment & Plan Note (Signed)
BP Readings from Last 3 Encounters:  04/11/21 (!) 133/56  03/28/21 (!) 148/62  03/02/21 134/62   bp stable. Continue losartan '100mg'$ .

## 2021-04-11 NOTE — Assessment & Plan Note (Signed)
Stable on allouprinol '100mg'$  bid. Continue same.

## 2021-04-11 NOTE — Telephone Encounter (Signed)
Please call [facility] and request a copy of his last office visit.

## 2021-04-11 NOTE — Assessment & Plan Note (Signed)
This is being managed with ESI being performed at his neurosurgeon's office.

## 2021-04-11 NOTE — Patient Instructions (Signed)
Please pick up stool kit at the lab and return at your earliest convenience.

## 2021-04-11 NOTE — Telephone Encounter (Signed)
Records release request faxed to [facility]

## 2021-04-11 NOTE — Progress Notes (Signed)
Subjective:   By signing my name below, I, [name], attest that this documentation has been prepared under the direction and in the presence of [name] NP. 04/11/2021    Patient ID: [name] Pratt, male    DOB: 07/28/1941, 80 y.o.   MRN: [id number]  Chief Complaint  Patient presents with   Hip Pain    Follow up on right hip pain    HPI Patient is in today for a office visit.  C. diff- During his las visit he had diarrhea and his stool sample found that he has C.diff. He reports that his diarrhea has improved since his last visit but he continues having it.  Neurosurgeon- He continues seeing a neurosurgeon to get injection in his back to manage his pain. He reports that his back pain comes and goes, but his right hip pain has slightly worsened.  Fatigue- His fatigue has worsened due to his limp from his painful hips. He is going to physical therapy to manage his fatigue at this time. Gout- He reports that he is having episodes of pain in his great toe and is planning to see his urologist to get further tested. Magnesium- He started taking magnesium supplements to manage his painful joints and muscles and to also help his limps. Falls- He reports that he recently fell in his backyard onto his rose bush. He only suffered from cuts and bruises from that fall.  Rhinorrhea- He reports having a runny nose and mild cough. His wife was recently diagnosed with bronchitis. He has not been exposed with Covid-19 recently but is willing to get a Covid-19 test.     Health Maintenance Due  Topic Date Due   COVID-19 Vaccine (4 - Booster for [place] series) 09/15/2020   TETANUS/TDAP  12/26/2020   INFLUENZA VACCINE  04/11/2021    Past Medical History:  Diagnosis Date   Aortic atherosclerosis ([place]) 03/05/2020   Arthritis    Cancer ([place])    skin cancer   Carotid stenosis    a. Carotid U/S 6/60: LICA < 63%, RICA 01-60%;  b.  Carotid U/S 1/09:  RICA 32-35%; LICA 5-73%; f/u 1 year    Chest pain     Myoview in 2008 was normal.  Echo 7/09: EF 60%, normal wall motion, mild LVH, mild LAE.   Complication of anesthesia    "stomach does not wake up"   COPD (chronic obstructive pulmonary disease) (HCC)    Emphysema, unspecified ([place]) 03/05/2020   GERD (gastroesophageal reflux disease)    Gout    History of chicken pox    History of hemorrhoids    History of kidney stones    Hyperlipidemia    under control   Hypertension    under control   Hypothyroidism    Ileus ([place])    Inguinal hernia    OSA (obstructive sleep apnea)    Pancreatitis    PONV (postoperative nausea and vomiting)    Rosacea    SIRS (systemic inflammatory response syndrome) ([place]) 03/04/2020   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    Past Surgical History:  Procedure Laterality Date   ABDOMINAL HERNIA REPAIR  07/15/12   Dr [name]   ANTERIOR CERVICAL DECOMP/DISCECTOMY FUSION N/A 07/09/2020   Procedure: ANTERIOR CERVICAL DECOMPRESSION AND FUSION CERVICAL THREE-FOUR.;  Surgeon: [name], MD;  Location: [facility];  Service: Neurosurgery;  Laterality: N/A;  anterior   BROW LIFT  05/07/01  CYSTOSCOPY WITH URETEROSCOPY AND STENT PLACEMENT Right 01/28/2014   Procedure: CYSTOSCOPY WITH URETEROSCOPY, BASKET RETRIVAL AND  STENT PLACEMENT;  Surgeon: [name], MD;  [facility];  Service: Urology;  Laterality: Right;   epidural injections     multiple procedures   ESOPHAGEAL DILATION  1993 and 1994   multiple times   EYE SURGERY Bilateral 03/25/10, 2012   cataract removal   HEMORRHOID SURGERY  2014   HIATAL HERNIA REPAIR  12/21/93   HOLMIUM LASER APPLICATION Right 1/66/0630   Procedure: HOLMIUM LASER APPLICATION;  Surgeon: [name], MD;  [facility];  Service: Urology;  Laterality: Right;   INGUINAL HERNIA REPAIR Right 07/15/12   Dr [name], x2   JOINT REPLACEMENT  07/25/04   right knee   JOINT REPLACEMENT  07/13/10   left knee   [place]  09/20/06   with intraoperative cholangiogram and right inguinal herniorrhaphy with mesh    LIGAMENT REPAIR Left 07/2013   shoulder   LITHOTRIPSY     x2   [place] FUSION/FORAMINOTOMY N/A 11/24/2020   Procedure: Posterior Cervical Laminectomy Cervical three-four, Cervical four-five with lateral mass fusion;  Surgeon: [name], MD;  Location: [facility];  Service: Neurosurgery;  Laterality: N/A;   REFRACTIVE SURGERY Bilateral    Right total hip replacement  4/14   SKIN CANCER DESTRUCTION     nose, ear   TONSILLECTOMY  as child   TOTAL HIP ARTHROPLASTY Left    URETHRAL DILATION  1991   [name]    Family History  Problem Relation Age of Onset   Cancer Mother    Hypertension Other    Cancer Other    Osteoarthritis Other     Social History   Socioeconomic History   Marital status: Married    Spouse name: Not on file   Number of children: 1   Years of education: Not on file   Highest education level: Not on file  Occupational History   Occupation: firefighter    Comment: paints on the side  Tobacco Use   Smoking status: Former    Years: 11.00    Types: Cigarettes    Quit date: 09/11/1978    Years since quitting: 42.6   Smokeless tobacco: Never  Vaping Use   Vaping Use: Never used  Substance and Sexual Activity   Alcohol use: Not Currently    Alcohol/week: 0.0 standard drinks    Comment: rare   Drug use: No   Sexual activity: Not on file  Other Topics Concern   Not on file  Social History Narrative   Lives with his wife   He has one son- lives locally.   2 grandchildren   Has worked for [employer], trade union   Enjoys wood working   Completed HS.  [employer]   Social Determinants of Radio broadcast assistant Strain: Not on file  Food Insecurity: Not on file  Transportation Needs: Not on file  Physical Activity: Not on file  Stress: Not on file  Social Connections: Not on file   Intimate Partner Violence: Not on file    Outpatient Medications Prior to Visit  Medication Sig Dispense Refill   acetaminophen (TYLENOL) 650 MG CR tablet Take 650-1,300 mg by mouth See admin instructions. Take 1300 mg in the morning and 650 g at bedtime     albuterol (VENTOLIN HFA) 108 (90 Base) MCG/ACT inhaler Inhale  2 puffs into the lungs every 6 (six) hours as needed for shortness of breath. 1 each 3   Alcohol Swabs (ALCOHOL PADS) 70 % PADS 1 packet by Does not apply route 2 (two) times daily. 100 each 1   allopurinol (ZYLOPRIM) 100 MG tablet TAKE 1 TABLET TWICE DAILY 180 tablet 1   amLODipine (NORVASC) 5 MG tablet TAKE 1 TABLET EVERY DAY 90 tablet 1   Artificial Tear Solution (SOOTHE XP) SOLN Place 1 drop into both eyes 2 (two) times daily as needed (Dry eyes).     aspirin EC 81 MG tablet Take 81 mg by mouth every morning.     blood glucose meter kit and supplies Dispense based on patient and insurance preference. Use up to four times daily as directed. (FOR ICD-10 E10.9, E11.9). 1 each 0   Cholecalciferol (VITAMIN D) 50 MCG (2000 UT) CAPS Take 2,000 Units by mouth daily.     cholestyramine (QUESTRAN) 4 g packet Take 1 packet (4 g total) by mouth 2 (two) times daily as needed. 60 each 2   colchicine 0.6 MG tablet Take 1 tablet by mouth daily as needed (Gout).     Cranberry 500 MG TABS Take 500 mg by mouth daily.      cycloSPORINE (RESTASIS) 0.05 % ophthalmic emulsion Place 1 drop into both eyes 2 (two) times daily.     diclofenac Sodium (VOLTAREN) 1 % GEL Apply 2 g topically daily as needed (Hand pain).     gabapentin (NEURONTIN) 300 MG capsule Take 300 mg by mouth 2 (two) times daily as needed.     glucose blood test strip TRUE Metrix Blood Glucose Test Strip, use as instructed 100 each 12   Homeopathic Products (THERAWORX RELIEF) FOAM Apply 1 application topically 2 (two) times daily as needed (Pain).     hydrALAZINE (APRESOLINE) 50 MG tablet TAKE 1 TABLET THREE TIMES DAILY 270 tablet  2   Lancets MISC 1 each by Does not apply route 2 (two) times daily. TRUE matrix lancets 100 each 3   levothyroxine (SYNTHROID) 75 MCG tablet Take 2 tabs by mouth once daily 6 days a week and 1 tab by mouth once daily one day a week 180 tablet 0   losartan (COZAAR) 100 MG tablet Take 1 tablet (100 mg total) by mouth daily. 90 tablet 1   Magnesium 250 MG TABS Take by mouth.     Menthol, Topical Analgesic, (ZIMS MAX-FREEZE EX) Apply 1 application topically daily as needed (pain).     multivitamin (THERAGRAN) per tablet Take 1 tablet by mouth daily.     nitroGLYCERIN (NITROSTAT) 0.4 MG SL tablet Place 1 tablet (0.4 mg total) under the tongue every 5 (five) minutes as needed for chest pain. 25 tablet 3   Omega-3 Fatty Acids (FISH OIL) 1200 MG CAPS Take 1,200 mg by mouth daily.     rosuvastatin (CRESTOR) 5 MG tablet Take 1 tablet (5 mg total) by mouth daily. 90 tablet 0   sodium chloride (OCEAN) 0.65 % nasal spray Place 1 spray into both nostrils at bedtime as needed for congestion.     No facility-administered medications prior to visit.    Allergies  Allergen Reactions   Pravastatin Other (See Comments)    Muscle cramps   Sildenafil Other (See Comments)    Tachycardia     Metoclopramide    Oxycodone     hallucinations   Atenolol Other (See Comments)    bradycardia   Elastic Bandages & [Zinc] Other (  See Comments)    Teflon bandages - Irritation   Metoclopramide Hcl Anxiety    Review of Systems  HENT:         (+)Rhinorrhea  Gastrointestinal:  Positive for diarrhea (Mild).  Musculoskeletal:  Positive for back pain, falls, joint pain (Right hip) and myalgias (great toe).      Objective:    Physical Exam Constitutional:      General: He is not in acute distress.    Appearance: Normal appearance. He is not ill-appearing.  HENT:     Head: Normocephalic and atraumatic.     Right Ear: External ear normal.     Left Ear: External ear normal.  Eyes:     Extraocular Movements:  Extraocular movements intact.     Pupils: Pupils are equal, round, and reactive to light.  Cardiovascular:     Rate and Rhythm: Regular rhythm.     Pulses: Normal pulses.     Heart sounds: Normal heart sounds. No murmur heard.   No gallop.  Pulmonary:     Effort: Pulmonary effort is normal. No respiratory distress.     Breath sounds: Normal breath sounds. No wheezing or rales.  Skin:    General: Skin is warm and dry.  Neurological:     Mental Status: He is alert and oriented to person, place, and time.  Psychiatric:        Behavior: Behavior normal.    BP (!) 133/56 (BP Location: Right Arm, Patient Position: Sitting, Cuff Size: Small)   Pulse (!) 59   Temp 98.5 F (36.9 C) (Oral)   Resp 16   Wt 158 lb (71.7 kg)   SpO2 98%   BMI 25.50 kg/m  Wt Readings from Last 3 Encounters:  04/11/21 158 lb (71.7 kg)  03/28/21 158 lb (71.7 kg)  03/02/21 151 lb (68.5 kg)       Assessment & Plan:   Problem List Items Addressed This Visit       Unprioritized   Nasal congestion   Relevant Orders   Novel Coronavirus, NAA ([facility])   Lumbar radiculopathy    This is being managed with ESI being performed at his neurosurgeon's office.         Hypothyroidism    Lab Results  Component Value Date   TSH 0.46 12/29/2020  TSH stable. Continue synthroid 58mg once daily.       GOUT    Stable on allouprinol 1058mbid. Continue same.        Essential hypertension    BP Readings from Last 3 Encounters:  04/11/21 (!) 133/56  03/28/21 (!) 148/62  03/02/21 134/62  bp stable. Continue losartan 1005m      C. difficile colitis - Primary    S/p treatment with vancomycin x 10 days. Still having some diarrhea. Will repeat stool study.        Relevant Orders   Clostridium Difficile by PCR([facility])     No orders of the defined types were placed in this encounter.   I, MelDebbrah Alar, personally preformed the services described in this documentation.  All medical  record entries made by the scribe were at my direction and in my presence.  I have reviewed the chart and discharge instructions (if applicable) and agree that the record reflects my personal performance and is accurate and complete. 04/11/2021   I,[name],acting as a scribe for MelNance PearP.,have documented all relevant documentation on the behalf of MelNance PearP,as directed by  Nance Pear, NP while in the presence of Nance Pear, NP.   Nance Pear, NP

## 2021-04-11 NOTE — Assessment & Plan Note (Signed)
S/p treatment with vancomycin x 10 days. Still having some diarrhea. Will repeat stool study.

## 2021-04-12 ENCOUNTER — Other Ambulatory Visit (INDEPENDENT_AMBULATORY_CARE_PROVIDER_SITE_OTHER): Payer: Medicare HMO

## 2021-04-12 ENCOUNTER — Encounter: Payer: Medicare HMO | Admitting: Rehabilitative and Restorative Service Providers"

## 2021-04-12 DIAGNOSIS — A0472 Enterocolitis due to Clostridium difficile, not specified as recurrent: Secondary | ICD-10-CM | POA: Diagnosis not present

## 2021-04-12 LAB — NOVEL CORONAVIRUS, NAA: SARS-CoV-2, NAA: NOT DETECTED

## 2021-04-12 LAB — SARS-COV-2, NAA 2 DAY TAT

## 2021-04-12 NOTE — Addendum Note (Signed)
Addended by: Kelle Darting A on: 04/12/2021 09:40 AM   Modules accepted: Orders

## 2021-04-13 ENCOUNTER — Other Ambulatory Visit: Payer: Self-pay | Admitting: Family

## 2021-04-13 DIAGNOSIS — M47816 Spondylosis without myelopathy or radiculopathy, lumbar region: Secondary | ICD-10-CM | POA: Diagnosis not present

## 2021-04-14 ENCOUNTER — Telehealth: Payer: Self-pay | Admitting: *Deleted

## 2021-04-14 DIAGNOSIS — A0471 Enterocolitis due to Clostridium difficile, recurrent: Secondary | ICD-10-CM

## 2021-04-14 LAB — CLOSTRIDIUM DIFFICILE BY PCR: Toxigenic C. Difficile by PCR: POSITIVE — AB

## 2021-04-14 MED ORDER — METRONIDAZOLE 500 MG PO TABS: 500.0000 mg | ORAL_TABLET | Freq: Three times a day (TID) | ORAL | 0 refills | Status: AC

## 2021-04-14 NOTE — Telephone Encounter (Signed)
Patient was calling for lab results.  I advised that you have not reviewed yet and they were just resulted today.  Patient stated he had diarrhea this morning, he couldn't make it to the bathroom.

## 2021-04-14 NOTE — Telephone Encounter (Signed)
Labs show he still has c diff.  I would like him to start metronidazole '500mg'$  tid x 14 days.  I would also like to get him back in with his GI ASAP.    Rod Holler, please notify pt re: results/medication.    Gwen, please get pt in with Dr. Liliane Channel office in the next 1 week. Let me know if you have any difficulty with this.

## 2021-04-14 NOTE — Addendum Note (Signed)
Addended by: [name] on: 04/14/2021 06:09 PM   Modules accepted: Orders

## 2021-04-15 ENCOUNTER — Ambulatory Visit: Payer: Medicare HMO | Admitting: Rehabilitative and Restorative Service Providers"

## 2021-04-15 ENCOUNTER — Other Ambulatory Visit: Payer: Self-pay

## 2021-04-15 DIAGNOSIS — R2689 Other abnormalities of gait and mobility: Secondary | ICD-10-CM | POA: Diagnosis not present

## 2021-04-15 DIAGNOSIS — M6281 Muscle weakness (generalized): Secondary | ICD-10-CM

## 2021-04-15 DIAGNOSIS — R29818 Other symptoms and signs involving the nervous system: Secondary | ICD-10-CM

## 2021-04-15 NOTE — Telephone Encounter (Signed)
Patient advised of results, treatment plan and referral. He verbalized understanding and will start medication today

## 2021-04-15 NOTE — Therapy (Signed)
[facility] [place], [place], [address] Phone: [phone]   Fax:  [phone]  Physical Therapy Treatment  Patient Details  Name: [name] Pratt MRN: [id number] Date of Birth: [birth year] Referring Provider (PT): [name], NP   Encounter Date: 04/15/2021   PT End of Session - 04/15/21 0939     Visit Number 4    Number of Visits 12    Date for PT Re-Evaluation 05/10/21    Authorization Type [name]- requested/ approved thru 8/30    Authorization - Visit Number 4    Authorization - Number of Visits 12    PT Start Time 0930    PT Stop Time 1012    PT Time Calculation (min) 42 min    Activity Tolerance Patient tolerated treatment well    Behavior During Therapy [facility] for tasks assessed/performed             Past Medical History:  Diagnosis Date   Aortic atherosclerosis ([place]) 03/05/2020   Arthritis    Cancer ([place])    skin cancer   Carotid stenosis    a. Carotid U/S [id number]: LICA < [id number], RICA 0000000;  b.  Carotid U/S 0000000:  RICA 123456; LICA [id number]; f/u 1 year   Chest pain     Myoview in 2008 was normal.  Echo 7/09: EF 60%, normal wall motion, mild LVH, mild LAE.   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    Past Surgical History:  Procedure Laterality Date    ABDOMINAL HERNIA REPAIR  07/15/12   Dr [name]   ANTERIOR CERVICAL DECOMP/DISCECTOMY FUSION N/A 07/09/2020   Procedure: ANTERIOR CERVICAL DECOMPRESSION AND FUSION CERVICAL THREE-FOUR.;  Surgeon: [name], MD;  Location: [facility];  Service: Neurosurgery;  Laterality: N/A;  anterior   BROW LIFT  05/07/01   CYSTOSCOPY WITH URETEROSCOPY AND STENT PLACEMENT Right 01/28/2014   Procedure: CYSTOSCOPY WITH URETEROSCOPY, BASKET RETRIVAL AND  STENT PLACEMENT;  Surgeon: [name], MD;  [facility];  Service: Urology;  Laterality: Right;   epidural injections     multiple procedures   ESOPHAGEAL DILATION  1993 and 1994   multiple times   EYE SURGERY Bilateral 03/25/10, 2012   cataract removal   HEMORRHOID SURGERY  2014   HIATAL HERNIA REPAIR  12/21/93   HOLMIUM LASER APPLICATION Right [id number]   Procedure: HOLMIUM LASER APPLICATION;  Surgeon: [name], MD;  [facility];  Service: Urology;  Laterality: Right;   INGUINAL HERNIA REPAIR Right 07/15/12   Dr [name], x2   JOINT REPLACEMENT  07/25/04   right knee   JOINT REPLACEMENT  07/13/10   left knee   [place]  09/20/06   with intraoperative cholangiogram and right inguinal herniorrhaphy with mesh    LIGAMENT REPAIR Left 07/2013   shoulder   LITHOTRIPSY     x2   [place] FUSION/FORAMINOTOMY N/A 11/24/2020   Procedure: Posterior Cervical Laminectomy Cervical three-four, Cervical four-five with lateral mass fusion;  Surgeon: [name], MD;  Location: [facility];  Service: Neurosurgery;  Laterality: N/A;   REFRACTIVE SURGERY Bilateral    Right total hip replacement  4/14   SKIN CANCER DESTRUCTION     nose, ear   TONSILLECTOMY  as child   TOTAL HIP ARTHROPLASTY Left    URETHRAL DILATION  1991   [name]    There were no vitals filed for this visit.   Subjective Assessment - 04/15/21 0951     Subjective The patient reports he had injections on WEdnesday  in his back.  He also retested for c-diff and is positive still although he underwent treatment last month.  *PT will keep him on contact precautions while in the clinic.    Pertinent History spondylitic myelopathy, ACDF, HTN, COPD, cervical decompression, sleep apnea    Patient Stated Goals "walking good and have good balance."    Currently in Pain? No/denies                [facility] PT Assessment - 04/15/21 0940       Assessment   Medical Diagnosis gait disorder    Referring Provider (PT) [name], NP    Onset Date/Surgical Date 11/14/20    Hand Dominance Right                           [facility] Adult PT Treatment/Exercise - 04/15/21 0940       Ambulation/Gait   Ambulation/Gait Yes    Ambulation/Gait Assistance 6: Modified independent (Device/Increase time)    Ambulation Distance (Feet) 150 Feet    Gait Comments improved gait and balance today      Exercises   Exercises Knee/Hip      Knee/Hip Exercises: Stretches   Active Hamstring Stretch Right;Left;30 seconds;2 reps    Quad Stretch Right;Left;1 rep;30 seconds    Gastroc Stretch Right;Left;1 rep;30 seconds    Gastroc Stretch Limitations in supine and in standing    Other Knee/Hip Stretches supine lumbar trunk rotation      Knee/Hip Exercises: Standing   Hip Flexion Stengthening;Right;Left;20 reps    Hip Flexion Limitations with elevated mat support    Functional Squat 10 reps    SLS x 10 seconds R and L x 2 reps iwth intermittent UE support    Other Standing Knee Exercises sit<>stand x 10 reps without UE support      Knee/Hip Exercises: Seated   Other Seated Knee/Hip Exercises ankle DF and PF quickly for timing      Knee/Hip Exercises: Supine   Bridges Strengthening;Both;10 reps    Straight Leg Raises Strengthening;Right;Left;10 reps    Other Supine Knee/Hip Exercises supine marching x 12 reps R and L      Knee/Hip Exercises: Sidelying   Hip ABduction Strengthening;Right;10 reps    Hip  ABduction Limitations --   modified with bolster to reduce ROM he has to lift through due to weakness   Clams x 10 R side      Knee/Hip Exercises: Prone   Hamstring Curl 10 reps    Hamstring Curl Limitations right and left  PT Long Term Goals - 03/29/21 1129       PT LONG TERM GOAL #1   Title The patient will be indep with HEP.    Time 6    Period Weeks    Target Date 05/10/21      PT LONG TERM GOAL #2   Title The patient will improve Berg balance scale from 39/56 up to 45/56.    Time 6    Period Weeks    Target Date 05/10/21      PT LONG TERM GOAL #3   Title The patient will improve gait speed from 2.35 ft/sec to > or equal to 2.62 ft/sec to demo improved community moiblity.    Time 6    Period Weeks    Target Date 05/10/21      PT LONG TERM GOAL #4   Title The patient will decrease TUG from 16.86 seconds to < or equal to 14 seconds to demo improving mobility.    Time 6    Period Weeks    Target Date 05/10/21                   Plan - 04/15/21 1005     Clinical Impression Statement PT modified session today to be less focused on low back/core due to recent injections.  Patient still has C-diff, therefore session occurred in private treatment area following infection prevention protocol for bleach wipes and use of gloves/contact precautions.    PT Treatment/Interventions ADLs/Self Care Home Management;Patient/family education;Balance training;Neuromuscular re-education;Gait training;Therapeutic activities;Therapeutic exercise;Functional mobility training;Taping;Manual techniques    PT Next Visit Plan balance, standing posture and awareness of COG, gait training, R hip strengthening, LE strengthening    PT Home Exercise Plan Access Code: [email or web address]    Consulted and Agree with Plan of Care Patient             Patient will benefit from skilled therapeutic intervention in order to improve the following deficits and  impairments:     Visit Diagnosis: Muscle weakness (generalized)  Other abnormalities of gait and mobility  Other symptoms and signs involving the nervous system     Problem List Patient Active Problem List   Diagnosis Date Noted   Nasal congestion 04/11/2021   C. 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[place]) 02/09/2021   Visual hallucinations 11/30/2020   Cervical myelopathy ([place]) 11/24/2020   Cystoid macular edema of both eyes 07/28/2020   Status post cervical spinal fusion 07/09/2020   History of total bilateral knee replacement 07/08/2020   Aortic atherosclerosis ([place]) 03/05/2020   Emphysema, unspecified ([place]) 03/05/2020   Heme positive stool 02/29/2020   Leg cramps 11/13/2019   Tibialis anterior tendon tear, nontraumatic 11/13/2019   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   CPAP (continuous positive airway pressure) dependence 03/25/2018   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   Acute idiopathic gout of hand 03/25/2018   Erectile dysfunction 06/20/2017   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 03/06/2017   Peripheral neuropathy 06/19/2016   Hearing loss 02/02/2016   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   Allergic rhinitis 02/02/2016   Atypical chest pain 11/24/2015   Preventative health care 11/24/2015   Onychomycosis 06/30/2015   Degenerative disc disease, lumbar 06/30/2015  Other allergic rhinitis 02/18/2015   Deviated nasal septum 02/18/2015   OSA on CPAP 02/18/2015   RLS (restless legs syndrome) 02/18/2015   GERD (gastroesophageal reflux disease) 09/18/2014   Hyperglycemia 03/03/2014   Sleep apnea with use of continuous positive airway pressure (CPAP)  07/15/2013   Carotid stenosis 02/24/2013   Nonspecific abnormal electrocardiogram (ECG) (EKG) 01/06/2013   DJD (degenerative joint disease) of hip 12/24/2012   Lower GI bleed 12/12/2012   Dyspnea 06/12/2011   Hypothyroidism 05/13/2009   HYPERCHOLESTEROLEMIA 05/13/2009   Hyperlipidemia 05/13/2009   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   Essential hypertension 05/13/2009   INGUINAL HERNIA 05/13/2009   HIATAL HERNIA 05/13/2009   NEPHROLITHIASIS 05/13/2009   ROSACEA 05/13/2009   [name], MPT  Kasra Melvin 04/15/2021, 1:19 PM  [facility] [place] [address] [place], [place], [address] Phone: [phone]   Fax:  [phone]  Name: [name] Pratt MRN: [id number] Date of Birth: March 06, 1941

## 2021-04-19 ENCOUNTER — Other Ambulatory Visit: Payer: Self-pay

## 2021-04-19 ENCOUNTER — Ambulatory Visit: Payer: Medicare HMO | Admitting: Rehabilitative and Restorative Service Providers"

## 2021-04-19 DIAGNOSIS — R2689 Other abnormalities of gait and mobility: Secondary | ICD-10-CM

## 2021-04-19 DIAGNOSIS — R29818 Other symptoms and signs involving the nervous system: Secondary | ICD-10-CM | POA: Diagnosis not present

## 2021-04-19 DIAGNOSIS — R2681 Unsteadiness on feet: Secondary | ICD-10-CM

## 2021-04-19 DIAGNOSIS — M6281 Muscle weakness (generalized): Secondary | ICD-10-CM | POA: Diagnosis not present

## 2021-04-19 NOTE — Therapy (Signed)
[place], [place], [address] Phone: [phone]   Fax:  [phone]  Physical Therapy Treatment  Patient Details  Name: [name] Pratt MRN: [id number] Date of Birth: 10-18-1940 Referring Provider (PT): [name], NP  Treatment performed with contact precautions due to c.diff  Encounter Date: 04/19/2021   PT End of Session - 04/19/21 1026     Visit Number 5    Number of Visits 12    Date for PT Re-Evaluation 05/10/21    Authorization Type [name]- requested/ approved thru 8/30    Authorization - Visit Number 5    Authorization - Number of Visits 12    PT Start Time 0933    PT Stop Time 1014    PT Time Calculation (min) 41 min    Activity Tolerance Patient tolerated treatment well    Behavior During Therapy [facility] for tasks assessed/performed             Past Medical History:  Diagnosis Date   Aortic atherosclerosis ([place]) 03/05/2020   Arthritis    Cancer ([place])    skin cancer   Carotid stenosis    a. Carotid U/S [id number]: LICA < [id number], RICA 0000000;  b.  Carotid U/S 0000000:  RICA 123456; LICA [id number]; f/u 1 year   Chest pain     Myoview in 2008 was normal.  Echo 7/09: EF 60%, normal wall motion, mild LVH, mild LAE.   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     Past Surgical History:  Procedure Laterality Date   ABDOMINAL HERNIA REPAIR  07/15/12   Dr [name]   ANTERIOR CERVICAL DECOMP/DISCECTOMY FUSION N/A 07/09/2020   Procedure: ANTERIOR CERVICAL DECOMPRESSION AND FUSION CERVICAL THREE-FOUR.;  Surgeon: [name], MD;  Location: [facility];  Service: Neurosurgery;  Laterality: N/A;  anterior   BROW LIFT  05/07/01   CYSTOSCOPY WITH URETEROSCOPY AND STENT PLACEMENT Right 01/28/2014   Procedure: CYSTOSCOPY WITH URETEROSCOPY, BASKET RETRIVAL AND  STENT PLACEMENT;  Surgeon: [name], MD;  [facility];  Service: Urology;  Laterality: Right;   epidural injections     multiple procedures   ESOPHAGEAL DILATION  1993 and 1994   multiple times   EYE SURGERY Bilateral 03/25/10, 2012   cataract removal   HEMORRHOID SURGERY  2014   HIATAL HERNIA REPAIR  12/21/93   HOLMIUM LASER APPLICATION Right [id number]   Procedure: HOLMIUM LASER APPLICATION;  Surgeon: [name], MD;  [facility];  Service: Urology;  Laterality: Right;   INGUINAL HERNIA REPAIR Right 07/15/12   Dr [name], x2   JOINT REPLACEMENT  07/25/04   right knee   JOINT REPLACEMENT  07/13/10   left knee   KNEE  SURGERY  1995   LAPAROSCOPIC CHOLECYSTECTOMY  09/20/06   with intraoperative cholangiogram and right inguinal herniorrhaphy with mesh    LIGAMENT REPAIR Left 07/2013   shoulder   LITHOTRIPSY     x2   [place] FUSION/FORAMINOTOMY N/A 11/24/2020   Procedure: Posterior Cervical Laminectomy Cervical three-four, Cervical four-five with lateral mass fusion;  Surgeon: [name], MD;  Location: [facility];  Service: Neurosurgery;  Laterality: N/A;   REFRACTIVE SURGERY Bilateral    Right total hip replacement  4/14   SKIN CANCER DESTRUCTION     nose, ear   TONSILLECTOMY  as child   TOTAL HIP ARTHROPLASTY Left    URETHRAL DILATION  1991   [name]    There were no vitals filed for this visit.   Subjective Assessment - 04/19/21 0935      Subjective The patient reports he has been tired and stiff and has not been able to do as many exercises this week.    Pertinent History spondylitic myelopathy, ACDF, HTN, COPD, cervical decompression, sleep apnea    Patient Stated Goals "walking good and have good balance."    Currently in Pain? No/denies                [facility] PT Assessment - 04/19/21 0936       Assessment   Medical Diagnosis gait disorder    Referring Provider (PT) [name], NP    Onset Date/Surgical Date 11/14/20    Hand Dominance Right                           [facility] Adult PT Treatment/Exercise - 04/19/21 0936       Ambulation/Gait   Ambulation/Gait Yes    Gait Comments backwards walking x 5 feet and forwards x 5 reps without device working on upright postural position      Neuro Re-ed    Neuro Re-ed Details  tandem standing near support surface      Exercises   Exercises Knee/Hip      Knee/Hip Exercises: Stretches   Other Knee/Hip Stretches supine lumbar trunk rotation      Knee/Hip Exercises: Standing   Hip Flexion Stengthening;Right;Left;20 reps    Hip Flexion Limitations with elevated mat support    Hip Abduction Stengthening;Right;Left;10 reps    Hip Extension Stengthening;Right;Left;10 reps    Extension Limitations backwards walking    SLS x 3 reps R and L    Other Standing Knee Exercises standing braiding steps    Other Standing Knee Exercises standing hip hinges abduction with UE support      Knee/Hip Exercises: Supine   Bridges Strengthening;Both;10 reps    Other Supine Knee/Hip Exercises moving R leg on/off mat for hip flexion strengthening    Other Supine Knee/Hip Exercises supine marching x 12 reps R and L      Knee/Hip Exercises: Sidelying   Hip ABduction Strengthening;Right;Left;10 reps    Clams x 10 reps R and L sides                         PT Long Term Goals - 03/29/21 1129       PT LONG TERM GOAL #1   Title The patient will  be indep with HEP.    Time 6    Period Weeks    Target Date 05/10/21      PT LONG TERM  GOAL #2   Title The patient will improve Berg balance scale from 39/56 up to 45/56.    Time 6    Period Weeks    Target Date 05/10/21      PT LONG TERM GOAL #3   Title The patient will improve gait speed from 2.35 ft/sec to > or equal to 2.62 ft/sec to demo improved community moiblity.    Time 6    Period Weeks    Target Date 05/10/21      PT LONG TERM GOAL #4   Title The patient will decrease TUG from 16.86 seconds to < or equal to 14 seconds to demo improving mobility.    Time 6    Period Weeks    Target Date 05/10/21                   Plan - 04/19/21 1003     Clinical Impression Statement The patient is tolerating progression of exercises.  He reports balance is primary issue, as well as R LE weakness.  PT continuing to progress to tolerance working on LTGs.    PT Treatment/Interventions ADLs/Self Care Home Management;Patient/family education;Balance training;Neuromuscular re-education;Gait training;Therapeutic activities;Therapeutic exercise;Functional mobility training;Taping;Manual techniques    PT Next Visit Plan balance, standing posture and awareness of COG, gait training, R hip strengthening, LE strengthening    PT Home Exercise Plan Access Code: [email or web address]    Consulted and Agree with Plan of Care Patient             Patient will benefit from skilled therapeutic intervention in order to improve the following deficits and impairments:     Visit Diagnosis: Muscle weakness (generalized)  Other abnormalities of gait and mobility  Other symptoms and signs involving the nervous system  Unsteadiness on feet     Problem List Patient Active Problem List   Diagnosis Date Noted   Nasal congestion 04/11/2021   C. difficile colitis 04/11/2021   Diarrhea 03/02/2021   Lumbar radiculopathy 03/02/2021   Gait disorder 03/02/2021   Hip pain 03/02/2021   Incontinence of  feces 03/02/2021   Situational depression 03/02/2021   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[place]) 02/09/2021   Visual hallucinations 11/30/2020   Cervical myelopathy ([place]) 11/24/2020   Cystoid macular edema of both eyes 07/28/2020   Status post cervical spinal fusion 07/09/2020   History of total bilateral knee replacement 07/08/2020   Aortic atherosclerosis ([place]) 03/05/2020   Emphysema, unspecified ([place]) 03/05/2020   Heme positive stool 02/29/2020   Leg cramps 11/13/2019   Tibialis anterior tendon tear, nontraumatic 11/13/2019   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   CPAP (continuous positive airway pressure) dependence 03/25/2018   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   Acute idiopathic gout of hand 03/25/2018   Erectile dysfunction 06/20/2017   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 03/06/2017   Peripheral neuropathy 06/19/2016   Hearing loss 02/02/2016   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   Allergic rhinitis 02/02/2016   Atypical chest pain 11/24/2015   Preventative health care 11/24/2015   Onychomycosis 06/30/2015   Degenerative disc disease, lumbar 06/30/2015   Other allergic rhinitis 02/18/2015   Deviated nasal septum 02/18/2015   OSA on CPAP 02/18/2015   RLS (restless legs syndrome) 02/18/2015   GERD (gastroesophageal reflux disease) 09/18/2014   Hyperglycemia 03/03/2014   Sleep apnea with use of continuous positive airway pressure (CPAP) 07/15/2013   Carotid stenosis 02/24/2013   Nonspecific abnormal  electrocardiogram (ECG) (EKG) 01/06/2013   DJD (degenerative joint disease) of hip 12/24/2012   Lower GI bleed 12/12/2012   Dyspnea 06/12/2011   Hypothyroidism 05/13/2009   HYPERCHOLESTEROLEMIA 05/13/2009   Hyperlipidemia 05/13/2009   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   Essential hypertension 05/13/2009   INGUINAL HERNIA 05/13/2009   HIATAL HERNIA 05/13/2009   NEPHROLITHIASIS  05/13/2009   ROSACEA 05/13/2009    Azarel Banner, PT 04/19/2021, 1:07 PM  [place], [place], [address] Phone: [phone]   Fax:  [phone]  Name: GARRETH FLATTEN MRN: [id number] Date of Birth: 1941-02-13

## 2021-04-22 ENCOUNTER — Ambulatory Visit: Payer: Medicare HMO | Admitting: Physical Therapy

## 2021-04-22 ENCOUNTER — Other Ambulatory Visit: Payer: Self-pay

## 2021-04-22 DIAGNOSIS — R2681 Unsteadiness on feet: Secondary | ICD-10-CM

## 2021-04-22 DIAGNOSIS — R29818 Other symptoms and signs involving the nervous system: Secondary | ICD-10-CM | POA: Diagnosis not present

## 2021-04-22 DIAGNOSIS — M6281 Muscle weakness (generalized): Secondary | ICD-10-CM | POA: Diagnosis not present

## 2021-04-22 DIAGNOSIS — R2689 Other abnormalities of gait and mobility: Secondary | ICD-10-CM

## 2021-04-22 NOTE — Therapy (Signed)
[place], [place], [address] Phone: [phone]   Fax:  [phone]  Physical Therapy Treatment  Patient Details  Name: [name] Pratt MRN: [id number] Date of Birth: [birth year] Referring Provider (PT): [name], NP   Encounter Date: 04/22/2021   PT End of Session - 04/22/21 1408     Visit Number 6    Number of Visits 12    Date for PT Re-Evaluation 05/10/21    Authorization Type [name]- requested/ approved thru 8/30    Authorization - Visit Number 6    Authorization - Number of Visits 12    PT Start Time [id number]    PT Stop Time [id number]    PT Time Calculation (min) 44 min    Equipment Utilized During Treatment Gait belt;Other (comment)   PPE for contact precautions.   Activity Tolerance Patient tolerated treatment well    Behavior During Therapy [facility] for tasks assessed/performed             Past Medical History:  Diagnosis Date   Aortic atherosclerosis ([place]) 03/05/2020   Arthritis    Cancer ([place])    skin cancer   Carotid stenosis    a. Carotid U/S [id number]: LICA < [id number], RICA 0000000;  b.  Carotid U/S 0000000:  RICA 123456; LICA [id number]; f/u 1 year   Chest pain     Myoview in 2008 was normal.  Echo 7/09: EF 60%, normal wall motion, mild LVH, mild LAE.   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    Past Surgical History:  Procedure Laterality Date   ABDOMINAL HERNIA REPAIR  07/15/12   Dr [name]   ANTERIOR CERVICAL DECOMP/DISCECTOMY FUSION N/A 07/09/2020   Procedure: ANTERIOR CERVICAL DECOMPRESSION AND FUSION CERVICAL THREE-FOUR.;  Surgeon: [name], MD;  Location: [facility];  Service: Neurosurgery;  Laterality: N/A;  anterior   BROW LIFT  05/07/01   CYSTOSCOPY WITH URETEROSCOPY AND STENT PLACEMENT Right 01/28/2014   Procedure: CYSTOSCOPY WITH URETEROSCOPY, BASKET RETRIVAL AND  STENT PLACEMENT;  Surgeon: [name], MD;  [facility];  Service: Urology;  Laterality: Right;   epidural injections     multiple procedures   ESOPHAGEAL DILATION  1993 and 1994   multiple times   EYE SURGERY Bilateral 03/25/10, 2012   cataract removal   HEMORRHOID SURGERY  2014   HIATAL HERNIA REPAIR  12/21/93   HOLMIUM LASER APPLICATION Right [id number]   Procedure: HOLMIUM LASER APPLICATION;  Surgeon: [name], MD;  [facility];  Service: Urology;  Laterality: Right;   INGUINAL HERNIA REPAIR Right 07/15/12   Dr [name], x2   JOINT REPLACEMENT  07/25/04   right knee   JOINT REPLACEMENT  07/13/10  left knee   KNEE SURGERY  1995   LAPAROSCOPIC CHOLECYSTECTOMY  09/20/06   with intraoperative cholangiogram and right inguinal herniorrhaphy with mesh    LIGAMENT REPAIR Left 07/2013   shoulder   LITHOTRIPSY     x2   [place] FUSION/FORAMINOTOMY N/A 11/24/2020   Procedure: Posterior Cervical Laminectomy Cervical three-four, Cervical four-five with lateral mass fusion;  Surgeon: [name], MD;  Location: [facility];  Service: Neurosurgery;  Laterality: N/A;   REFRACTIVE SURGERY Bilateral    Right total hip replacement  4/14   SKIN CANCER DESTRUCTION     nose, ear   TONSILLECTOMY  as child   TOTAL HIP ARTHROPLASTY Left    URETHRAL DILATION  1991   [name]    There were no vitals filed for this visit.   Subjective  Assessment - 04/22/21 1614     Subjective Pt reports that it is a good day today; he's not as tired. He inquires if he should be completing exercises 3x/day, as sometimes he is not able to due to fatigue or time.    Pertinent History spondylitic myelopathy, ACDF, HTN, COPD, cervical decompression, sleep apnea    Patient Stated Goals "walking good and have good balance."    Currently in Pain? No/denies    Pain Score 0-No pain                [facility] PT Assessment - 04/22/21 0001       Assessment   Medical Diagnosis gait disorder    Referring Provider (PT) [name], NP    Onset Date/Surgical Date 11/14/20    Hand Dominance Right      Timed Up and Go Test   TUG Comments 18.57 sec without cane, 15.11 sec with SPC              [facility] Adult PT Treatment/Exercise - 04/22/21 0001       Knee/Hip Exercises: Standing   Hip Abduction Stengthening;Right;Left;1 set;10 reps   UE support on elevated table   Hip Extension Stengthening;Right;Left;1 set;10 reps   UE support on elevated table   Functional Squat 1 set;10 reps   wide feet   Functional Squat Limitations tactile cues on trunk for even wt between feet and to correct lateral lean in trunk.    Other Standing Knee Exercises standing braiding steps    Other Standing Knee Exercises side steppig without UE support x 8 ft R/Lt with CGA for safety. 2 sets      Knee/Hip Exercises: Seated   Clamshell with [employer]ra[employer] Green   single leg, x 10 each   Sit to Sand 5 reps;without UE support   cues to slow descent            Balance Exercises - 04/22/21 0001       Balance Exercises: Standing   SLS Eyes open;Solid surface;Upper extremity support 2;2 reps;10 secs   in corner   Tandem Gait Forward;Retro;Upper extremity support;1 rep    Partial Tandem Stance Eyes open;Intermittent upper extremity support;2 reps   CGA - unsteady with RLE in back   Retro Gait 1 rep;Upper extremity support    Sidestepping Upper extremity support;2  reps   with increased step height   Marching Solid surface;Upper extremity assist 1;Forwards      OTAGO PROGRAM   Hip ABductor --    Overall OTAGO Comments --  PT Long Term Goals - 03/29/21 1129       PT LONG TERM GOAL #1   Title The patient will be indep with HEP.    Time 6    Period Weeks    Target Date 05/10/21      PT LONG TERM GOAL #2   Title The patient will improve Berg balance scale from 39/56 up to 45/56.    Time 6    Period Weeks    Target Date 05/10/21      PT LONG TERM GOAL #3   Title The patient will improve gait speed from 2.35 ft/sec to > or equal to 2.62 ft/sec to demo improved community moiblity.    Time 6    Period Weeks    Target Date 05/10/21      PT LONG TERM GOAL #4   Title The patient will decrease TUG from 16.86 seconds to < or equal to 14 seconds to demo improving mobility.    Time 6    Period Weeks    Target Date 05/10/21                   Plan - 04/22/21 1601     Clinical Impression Statement Pt continues with weakness in RLE with standing exercises. He requires UE support with Single leg exercises.  CGA with gait belt utilized with tandem stance/ tandem gait with single UE support on surface, for safety due to occasional scissoring and minor LOB.  Pt given short seated rest breaks after 3-4 standing exercises due to LE fatigue.  Pt participates well throughout and remains motivated to progress towards LTGs.    Comorbidities HTN, COPD, spondylitis myelopathy, falls    Rehab Potential Good    PT Frequency 2x / week    PT Duration 6 weeks    PT Treatment/Interventions ADLs/Self Care Home Management;Patient/family education;Balance training;Neuromuscular re-education;Gait training;Therapeutic activities;Therapeutic exercise;Functional mobility training;Taping;Manual techniques    PT Next Visit Plan balance, standing posture and awareness of COG, gait training, R hip strengthening, LE strengthening    PT Home  Exercise Plan Access Code: [email or web address]    Consulted and Agree with Plan of Care Patient             Patient will benefit from skilled therapeutic intervention in order to improve the following deficits and impairments:  Pain, Decreased activity tolerance, Decreased balance, Decreased strength, Decreased range of motion, Impaired flexibility, Impaired tone, Increased fascial restricitons  Visit Diagnosis: Muscle weakness (generalized)  Other abnormalities of gait and mobility  Other symptoms and signs involving the nervous system  Unsteadiness on feet     Problem List Patient Active Problem List   Diagnosis Date Noted   Nasal congestion 04/11/2021   C. 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[place]) 02/09/2021   Visual hallucinations 11/30/2020   Cervical myelopathy ([place]) 11/24/2020   Cystoid macular edema of both eyes 07/28/2020   Status post cervical spinal fusion 07/09/2020   History of total bilateral knee replacement 07/08/2020   Aortic atherosclerosis ([place]) 03/05/2020   Emphysema, unspecified ([place]) 03/05/2020   Heme positive stool 02/29/2020   Leg cramps 11/13/2019   Tibialis anterior tendon tear, nontraumatic 11/13/2019   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   CPAP (continuous  positive airway pressure) dependence 03/25/2018   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   Acute idiopathic gout of hand 03/25/2018   Erectile dysfunction 06/20/2017   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 03/06/2017   Peripheral neuropathy 06/19/2016   Hearing loss 02/02/2016   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   Allergic rhinitis 02/02/2016   Atypical chest pain 11/24/2015    Preventative health care 11/24/2015   Onychomycosis 06/30/2015   Degenerative disc disease, lumbar 06/30/2015   Other allergic rhinitis 02/18/2015   Deviated nasal septum 02/18/2015   OSA on CPAP 02/18/2015   RLS (restless legs syndrome) 02/18/2015   GERD (gastroesophageal reflux disease) 09/18/2014   Hyperglycemia 03/03/2014   Sleep apnea with use of continuous positive airway pressure (CPAP) 07/15/2013   Carotid stenosis 02/24/2013   Nonspecific abnormal electrocardiogram (ECG) (EKG) 01/06/2013   DJD (degenerative joint disease) of hip 12/24/2012   Lower GI bleed 12/12/2012   Dyspnea 06/12/2011   Hypothyroidism 05/13/2009   HYPERCHOLESTEROLEMIA 05/13/2009   Hyperlipidemia 05/13/2009   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   Essential hypertension 05/13/2009   INGUINAL HERNIA 05/13/2009   HIATAL HERNIA 05/13/2009   NEPHROLITHIASIS 05/13/2009   ROSACEA 05/13/2009   Kerin Perna, PTA 04/22/21 4:17 PM   [facility] [place] [address] [place], [place], [address] Phone: [phone]   Fax:  [phone]  Name: [name] Pratt MRN: [id number] Date of Birth: [birth year]

## 2021-04-27 ENCOUNTER — Ambulatory Visit: Payer: Medicare HMO | Admitting: Physical Therapy

## 2021-04-27 ENCOUNTER — Other Ambulatory Visit: Payer: Self-pay

## 2021-04-27 DIAGNOSIS — R2681 Unsteadiness on feet: Secondary | ICD-10-CM | POA: Diagnosis not present

## 2021-04-27 DIAGNOSIS — M6281 Muscle weakness (generalized): Secondary | ICD-10-CM | POA: Diagnosis not present

## 2021-04-27 DIAGNOSIS — R2689 Other abnormalities of gait and mobility: Secondary | ICD-10-CM | POA: Diagnosis not present

## 2021-04-27 DIAGNOSIS — R29818 Other symptoms and signs involving the nervous system: Secondary | ICD-10-CM | POA: Diagnosis not present

## 2021-04-27 NOTE — Therapy (Signed)
[place], [place], [address] Phone: [phone]   Fax:  [phone]  Physical Therapy Treatment  Patient Details  Name: [name] Pratt MRN: [id number] Date of Birth: [birth year] Referring Provider (PT): [name], NP   Encounter Date: 04/27/2021   PT End of Session - 04/27/21 0920     Visit Number 7    Number of Visits 12    Date for PT Re-Evaluation 05/10/21    Authorization Type [name]- requested/ approved thru 8/30    Authorization - Visit Number 7    Authorization - Number of Visits 12    PT Start Time [phone]    PT Stop Time 0925    PT Time Calculation (min) 42 min    Activity Tolerance Patient tolerated treatment well    Behavior During Therapy [facility] for tasks assessed/performed             Past Medical History:  Diagnosis Date   Aortic atherosclerosis ([place]) 03/05/2020   Arthritis    Cancer ([place])    skin cancer   Carotid stenosis    a. Carotid U/S [id number]: LICA < [id number], RICA 0000000;  b.  Carotid U/S 0000000:  RICA 123456; LICA [id number]; f/u 1 year   Chest pain     Myoview in 2008 was normal.  Echo 7/09: EF 60%, normal wall motion, mild LVH, mild LAE.   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    Past Surgical History:  Procedure Laterality Date    ABDOMINAL HERNIA REPAIR  07/15/12   Dr [name]   ANTERIOR CERVICAL DECOMP/DISCECTOMY FUSION N/A 07/09/2020   Procedure: ANTERIOR CERVICAL DECOMPRESSION AND FUSION CERVICAL THREE-FOUR.;  Surgeon: [name], MD;  Location: [facility];  Service: Neurosurgery;  Laterality: N/A;  anterior   BROW LIFT  05/07/01   CYSTOSCOPY WITH URETEROSCOPY AND STENT PLACEMENT Right 01/28/2014   Procedure: CYSTOSCOPY WITH URETEROSCOPY, BASKET RETRIVAL AND  STENT PLACEMENT;  Surgeon: [name], MD;  Location: [facility];  Service: Urology;  Laterality: Right;   epidural injections     multiple procedures   ESOPHAGEAL DILATION  1993 and 1994   multiple times   EYE SURGERY Bilateral 03/25/10, 2012   cataract removal   HEMORRHOID SURGERY  2014   HIATAL HERNIA REPAIR  12/21/93   HOLMIUM LASER APPLICATION Right [id number]   Procedure: HOLMIUM LASER APPLICATION;  Surgeon: [name], MD;  Location: [facility];  Service: Urology;  Laterality: Right;   INGUINAL HERNIA REPAIR Right 07/15/12   Dr [name], x2   JOINT REPLACEMENT  07/25/04   right knee   JOINT REPLACEMENT  07/13/10   left knee   [place]  09/20/06   with intraoperative cholangiogram and right inguinal herniorrhaphy with mesh    LIGAMENT REPAIR Left 07/2013   shoulder   LITHOTRIPSY     x2   [place] FUSION/FORAMINOTOMY N/A 11/24/2020   Procedure: Posterior Cervical Laminectomy Cervical three-four, Cervical four-five with lateral mass fusion;  Surgeon: [name], MD;  Location: [facility];  Service: Neurosurgery;  Laterality: N/A;   REFRACTIVE SURGERY Bilateral    Right total hip replacement  4/14   SKIN CANCER DESTRUCTION     nose, ear   TONSILLECTOMY  as child   TOTAL HIP ARTHROPLASTY Left    URETHRAL DILATION  1991   [name]    There were no vitals filed for this visit.   Subjective Assessment - 04/27/21 0844     Subjective Pt states he is having more low back pain on his Rt  side today but has still been able to complete his HEP    Patient Stated Goals "walking good and have good balance."    Currently in Pain? Yes    Pain Score 4     Pain Location Back    Pain Orientation Lower    Pain Descriptors / Indicators Sore;Aching    Pain Type Chronic pain                [facility] PT Assessment - 04/27/21 0001       Assessment   Medical Diagnosis gait disorder    Referring Provider (PT) [name], NP    Onset Date/Surgical Date 11/14/20    Hand Dominance Right      Berg Balance Test   Sit to Stand Able to stand without using hands and stabilize independently    Standing Unsupported Able to stand safely 2 minutes    Sitting with Back Unsupported but Feet Supported on Floor or Stool Able to sit safely and securely 2 minutes    Stand to Sit Sits safely with minimal use of hands    Transfers Able to transfer safely, definite need of hands    Standing Unsupported with Eyes Closed Able to stand 10 seconds with supervision    Standing Unsupported with Feet Together Able to place feet together independently and stand for 1 minute with supervision    From Standing, Reach Forward with Outstretched Arm Can reach forward >5 cm safely (2")    From Standing Position, Pick up Object from [place] to pick up shoe safely and easily    From Standing Position, Turn to Look Behind Over each Shoulder Turn sideways only but maintains balance    Turn 360 Degrees Able to turn 360 degrees safely but slowly    Standing Unsupported, Alternately Place Feet on Step/Stool Able to complete >2 steps/needs minimal assist    Standing Unsupported, One Foot in Front Able to take small step independently and hold 30 seconds    Standing on One Leg Tries to lift leg/unable to hold 3 seconds but remains standing independently    Total Score 39                           [facility] Adult PT Treatment/Exercise - 04/27/21 0001       Ambulation/Gait   Gait Comments gait  sideways and backward without UE support with CGA, gait with stepping over and around obstacles with CGA      High Level Balance   High Level Balance Comments  standing balance on foam with head turns laterally and A/P 2 x 30 sec each      Knee/Hip Exercises: Standing   Heel Raises 1 set;10 reps    Hip Abduction Stengthening;Right;Left;1 set;10 reps   UE support on elevated table   Hip Extension Stengthening;Right;Left;1 set;10 reps   UE support on elevated table   Extension Limitations cues for posture    Functional Squat 10 reps    Other Standing Knee Exercises stepping sideways over noodles with UE support x 15 bilat, stepping forward/backward over noodles x 15 bilat with 1 UE support and CGA                         PT Long Term Goals - 03/29/21 1129       PT LONG TERM GOAL #1   Title The patient will be indep with HEP.    Time 6    Period Weeks    Target Date 05/10/21      PT LONG TERM GOAL #2   Title The patient will improve Berg balance scale from 39/56 up to 45/56.    Time 6    Period Weeks    Target Date 05/10/21      PT LONG TERM GOAL #3   Title The patient will improve gait speed from 2.35 ft/sec to > or equal to 2.62 ft/sec to demo improved community moiblity.    Time 6    Period Weeks    Target Date 05/10/21      PT LONG TERM GOAL #4   Title The patient will decrease TUG from 16.86 seconds to < or equal to 14 seconds to demo improving mobility.    Time 6    Period Weeks    Target Date 05/10/21                   Plan - 04/27/21 [id number]     Clinical Impression Statement Pt with no change on BERG balance score but demos improved balance reactions with addition of standing on foam and dynamic gait and balance tasks. Pt still requires frequent rest breaks due to fatigue in Rt LE    PT Next Visit Plan progress dynamic gait and balance, hip strengthening    PT Home Exercise Plan Access Code: [email or web address]    Consulted and Agree with Plan of Care  Patient             Patient will benefit from skilled therapeutic intervention in order to improve the following deficits and impairments:     Visit Diagnosis: Muscle weakness (generalized)  Other abnormalities of gait and mobility  Other symptoms and signs involving the nervous system  Unsteadiness on feet     Problem List Patient Active Problem List   Diagnosis Date Noted   Nasal congestion 04/11/2021   C. 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[place]) 02/09/2021   Visual hallucinations 11/30/2020   Cervical myelopathy ([place]) 11/24/2020   Cystoid macular edema of both eyes 07/28/2020   Status post cervical spinal fusion 07/09/2020   History of total bilateral knee replacement 07/08/2020   Aortic atherosclerosis ([place]) 03/05/2020   Emphysema, unspecified ([place]) 03/05/2020   Heme positive stool 02/29/2020   Leg cramps 11/13/2019  Tibialis anterior tendon tear, nontraumatic 11/13/2019   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   CPAP (continuous positive airway pressure) dependence 03/25/2018   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   Acute idiopathic gout of hand 03/25/2018   Erectile dysfunction 06/20/2017   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 03/06/2017   Peripheral neuropathy 06/19/2016   Hearing loss 02/02/2016   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   Allergic rhinitis 02/02/2016   Atypical chest pain 11/24/2015   Preventative health care 11/24/2015   Onychomycosis 06/30/2015   Degenerative disc disease, lumbar 06/30/2015   Other allergic rhinitis 02/18/2015   Deviated nasal septum 02/18/2015   OSA on CPAP 02/18/2015   RLS (restless legs syndrome) 02/18/2015    GERD (gastroesophageal reflux disease) 09/18/2014   Hyperglycemia 03/03/2014   Sleep apnea with use of continuous positive airway pressure (CPAP) 07/15/2013   Carotid stenosis 02/24/2013   Nonspecific abnormal electrocardiogram (ECG) (EKG) 01/06/2013   DJD (degenerative joint disease) of hip 12/24/2012   Lower GI bleed 12/12/2012   Dyspnea 06/12/2011   Hypothyroidism 05/13/2009   HYPERCHOLESTEROLEMIA 05/13/2009   Hyperlipidemia 05/13/2009   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   Essential hypertension 05/13/2009   INGUINAL HERNIA 05/13/2009   HIATAL HERNIA 05/13/2009   NEPHROLITHIASIS 05/13/2009   ROSACEA 05/13/2009   Lossie Kalp, PT  Dresean Beckel 04/27/2021, 9:26 AM  [place] [address] [place], [place], [address] Phone: [phone]   Fax:  [phone]  Name: JOSEALFREDO LANGTRY MRN: [id number] Date of Birth: 1941-05-12

## 2021-04-28 ENCOUNTER — Encounter (INDEPENDENT_AMBULATORY_CARE_PROVIDER_SITE_OTHER): Payer: Self-pay | Admitting: Ophthalmology

## 2021-04-28 ENCOUNTER — Ambulatory Visit (INDEPENDENT_AMBULATORY_CARE_PROVIDER_SITE_OTHER): Payer: Medicare HMO | Admitting: Ophthalmology

## 2021-04-28 DIAGNOSIS — A0472 Enterocolitis due to Clostridium difficile, not specified as recurrent: Secondary | ICD-10-CM | POA: Diagnosis not present

## 2021-04-28 DIAGNOSIS — H35073 Retinal telangiectasis, bilateral: Secondary | ICD-10-CM | POA: Diagnosis not present

## 2021-04-28 DIAGNOSIS — H353211 Exudative age-related macular degeneration, right eye, with active choroidal neovascularization: Secondary | ICD-10-CM | POA: Diagnosis not present

## 2021-04-28 DIAGNOSIS — H35353 Cystoid macular degeneration, bilateral: Secondary | ICD-10-CM

## 2021-04-28 MED ORDER — FLUORESCEIN SODIUM 10 % IV SOLN
500.0000 mg | INTRAVENOUS | Status: AC | PRN
  Administered 2021-04-28: 500 mg via INTRAVENOUS

## 2021-04-28 NOTE — Assessment & Plan Note (Signed)
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

## 2021-04-28 NOTE — Assessment & Plan Note (Signed)
Will observe today as patient has 2-1/2 to 3 months now improved mask fit using CPAP

## 2021-04-28 NOTE — Assessment & Plan Note (Signed)
Patient had significant troubles with mask usage because of fit with multiple leakages prior to.  Some 3 months ago that with the mid to early May 2022.  He reports having a new memory foam type mask with better sealing over the last 2 and half to 3 months.  This is coincident with the time which CNVM.  Apparent Worsening of perifoveal macular edema developed and was treated as a CNVM commencing February 17, 2021.  Now that perimacular CME nasal to the fovea has improved and this could be on the basis of improved CPAP compliance no leakage. We will thus observe today

## 2021-04-28 NOTE — Progress Notes (Signed)
04/28/2021     CHIEF COMPLAINT Patient presents for Retina Follow Up (1 Wk F/U OD, poss Avastin OD//Pt denies noticeable changes to [place] OU since last visit. Pt denies ocular pain, flashes of light, or floaters OU. //)   HISTORY OF PRESENT ILLNESS: [name] Pratt is a 80 y.o. male who presents to the clinic today for:   HPI     Retina Follow Up           Diagnosis: Wet AMD   Laterality: right eye   Onset: 5 weeks ago   Severity: moderate   Duration: 5 weeks   Course: stable   Comments: 1 Wk F/U OD, poss Avastin OD  Pt denies noticeable changes to [place] OU since last visit. Pt denies ocular pain, flashes of light, or floaters OU.            Comments   5 wk fp/ffa r/l oct avastin od Pt states "my vision comes and goes, Pratt the newspaper is hard." Denies new FOL/ floaters.       Last edited by Laurin Coder, COA on 04/28/2021 10:57 AM.      Referring physician: [name], NP [address] [place],  [place] [address]  HISTORICAL INFORMATION:   Selected notes from the MEDICAL RECORD NUMBER    Lab Results  Component Value Date   HGBA1C 5.7 12/29/2020     CURRENT MEDICATIONS: Current Outpatient Medications (Ophthalmic Drugs)  Medication Sig   Artificial Tear Solution (SOOTHE XP) SOLN Place 1 drop into both eyes 2 (two) times daily as needed (Dry eyes).   cycloSPORINE (RESTASIS) 0.05 % ophthalmic emulsion Place 1 drop into both eyes 2 (two) times daily.   No current facility-administered medications for this visit. (Ophthalmic Drugs)   Current Outpatient Medications (Other)  Medication Sig   acetaminophen (TYLENOL) 650 MG CR tablet Take 650-1,300 mg by mouth See admin instructions. Take 1300 mg in the morning and 650 g at bedtime   albuterol (VENTOLIN HFA) 108 (90 Base) MCG/ACT inhaler Inhale 2 puffs into the lungs every 6 (six) hours as needed for shortness of breath.   Alcohol Swabs (ALCOHOL PADS) 70 % PADS 1 packet by Does  not apply route 2 (two) times daily.   allopurinol (ZYLOPRIM) 100 MG tablet TAKE 1 TABLET TWICE DAILY   amLODipine (NORVASC) 5 MG tablet TAKE 1 TABLET EVERY DAY   aspirin EC 81 MG tablet Take 81 mg by mouth every morning.   blood glucose meter kit and supplies Dispense based on patient and insurance preference. Use up to four times daily as directed. (FOR ICD-10 E10.9, E11.9).   Cholecalciferol (VITAMIN D) 50 MCG (2000 UT) CAPS Take 2,000 Units by mouth daily.   cholestyramine (QUESTRAN) 4 g packet Take 1 packet (4 g total) by mouth 2 (two) times daily as needed.   colchicine 0.6 MG tablet Take 1 tablet by mouth daily as needed (Gout).   Cranberry 500 MG TABS Take 500 mg by mouth daily.    diclofenac Sodium (VOLTAREN) 1 % GEL Apply 2 g topically daily as needed (Hand pain).   gabapentin (NEURONTIN) 300 MG capsule Take 300 mg by mouth 2 (two) times daily as needed.   glucose blood test strip TRUE Metrix Blood Glucose Test Strip, use as instructed   Homeopathic Products (THERAWORX RELIEF) FOAM Apply 1 application topically 2 (two) times daily as needed (Pain).   hydrALAZINE (APRESOLINE) 50 MG tablet TAKE 1 TABLET THREE TIMES  DAILY   Lancets MISC 1 each by Does not apply route 2 (two) times daily. TRUE matrix lancets   levothyroxine (SYNTHROID) 75 MCG tablet Take 2 tabs by mouth once daily 6 days a week and 1 tab by mouth once daily one day a week   losartan (COZAAR) 100 MG tablet Take 1 tablet (100 mg total) by mouth daily.   Magnesium 250 MG TABS Take by mouth.   Menthol, Topical Analgesic, (ZIMS MAX-FREEZE EX) Apply 1 application topically daily as needed (pain).   metroNIDAZOLE (FLAGYL) 500 MG tablet Take 1 tablet (500 mg total) by mouth 3 (three) times daily for 14 days.   multivitamin (THERAGRAN) per tablet Take 1 tablet by mouth daily.   nitroGLYCERIN (NITROSTAT) 0.4 MG SL tablet Place 1 tablet (0.4 mg total) under the tongue every 5 (five) minutes as needed for chest pain.   Omega-3 Fatty  Acids (FISH OIL) 1200 MG CAPS Take 1,200 mg by mouth daily.   rosuvastatin (CRESTOR) 5 MG tablet TAKE 1 TABLET EVERY DAY   sodium chloride (OCEAN) 0.65 % nasal spray Place 1 spray into both nostrils at bedtime as needed for congestion.   No current facility-administered medications for this visit. (Other)      REVIEW OF SYSTEMS:    ALLERGIES Allergies  Allergen Reactions   Pravastatin Other (See Comments)    Muscle cramps   Sildenafil Other (See Comments)    Tachycardia     Metoclopramide    Oxycodone     hallucinations   Atenolol Other (See Comments)    bradycardia   Elastic Bandages & [Zinc] Other (See Comments)    Teflon bandages - Irritation   Metoclopramide Hcl Anxiety    PAST MEDICAL HISTORY Past Medical History:  Diagnosis Date   Aortic atherosclerosis ([place]) 03/05/2020   Arthritis    Cancer ([place])    skin cancer   Carotid stenosis    a. Carotid U/S 3/25: LICA < 49%, RICA 82-64%;  b.  Carotid U/S 1/58:  RICA 30-94%; LICA 0-76%; f/u 1 year   Chest pain     Myoview in 2008 was normal.  Echo 7/09: EF 60%, normal wall motion, mild LVH, mild LAE.   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   Past Surgical History:  Procedure Laterality Date   ABDOMINAL HERNIA REPAIR  07/15/12   Dr [name]   ANTERIOR CERVICAL DECOMP/DISCECTOMY FUSION N/A 07/09/2020    Procedure: ANTERIOR CERVICAL DECOMPRESSION AND FUSION CERVICAL THREE-FOUR.;  Surgeon: [name], MD;  Location: [facility];  Service: Neurosurgery;  Laterality: N/A;  anterior   BROW LIFT  05/07/01   CYSTOSCOPY WITH URETEROSCOPY AND STENT PLACEMENT Right 01/28/2014   Procedure: CYSTOSCOPY WITH URETEROSCOPY, BASKET RETRIVAL AND  STENT PLACEMENT;  Surgeon: [name], MD;  Location: [facility];  Service: Urology;  Laterality: Right;   epidural injections     multiple procedures   ESOPHAGEAL DILATION  1993 and 1994   multiple times   EYE SURGERY Bilateral 03/25/10, 2012   cataract removal   HEMORRHOID SURGERY  2014   HIATAL HERNIA REPAIR  12/21/93   HOLMIUM LASER APPLICATION Right 03/13/5008   Procedure: HOLMIUM LASER APPLICATION;  Surgeon: [name], MD;  Location: [facility];  Service: Urology;  Laterality: Right;   INGUINAL HERNIA REPAIR Right 07/15/12   Dr [name], x2   JOINT REPLACEMENT  07/25/04   right knee   JOINT REPLACEMENT  07/13/10   left knee   [place]  09/20/06   with intraoperative cholangiogram and right inguinal herniorrhaphy with mesh    LIGAMENT REPAIR Left 07/2013   shoulder   LITHOTRIPSY     x2   [place] FUSION/FORAMINOTOMY N/A 11/24/2020   Procedure: Posterior Cervical Laminectomy Cervical three-four, Cervical four-five with lateral mass fusion;  Surgeon: [name], MD;  Location: [facility];  Service: Neurosurgery;  Laterality: N/A;   REFRACTIVE SURGERY Bilateral    Right total hip replacement  4/14   SKIN CANCER DESTRUCTION     nose, ear   TONSILLECTOMY  as child   TOTAL HIP ARTHROPLASTY Left    URETHRAL DILATION  1991   [name]    FAMILY HISTORY Family History  Problem Relation Age of Onset   Cancer Mother    Hypertension Other    Cancer Other    Osteoarthritis Other     SOCIAL HISTORY Social History   Tobacco Use   Smoking status: Former    Years: 11.00    Types:  Cigarettes    Quit date: 09/11/1978    Years since quitting: 42.6   Smokeless tobacco: Never  Vaping Use   Vaping Use: Never used  Substance Use Topics   Alcohol use: Not Currently    Alcohol/week: 0.0 standard drinks    Comment: rare   Drug use: No         OPHTHALMIC EXAM:  Base Eye Exam     Visual Acuity (ETDRS)       Right Left   Dist [place] 20/60 -2 20/50 -2   Dist ph [place] NI 20/30 -1         Tonometry (Tonopen, 11:03 AM)       Right Left   Pressure 16 7         Pupils       Pupils Shape React APD   Right PERRL Round Minimal None   Left PERRL Round Minimal None         Visual Fields (Counting fingers)       Left Right    Full Full         Extraocular Movement       Right Left    Full Full         Neuro/Psych     Oriented x3: Yes   Mood/Affect: Normal         Dilation     Both eyes: 1.0% Mydriacyl, 2.5% Phenylephrine @ 11:03 AM           Slit Lamp and Fundus Exam     External Exam       Right Left   External Normal Normal  Slit Lamp Exam       Right Left   Lids/Lashes Normal Normal   Conjunctiva/Sclera White and quiet White and quiet   Cornea Clear Clear   Anterior Chamber Deep and quiet Deep and quiet   [place]ris PI PI   Lens Clear Clear   Anterior Vitreous Normal Normal         Fundus Exam       Right Left   Posterior Vitreous Posterior vitreous detachment    Disc Normal    C/D Ratio 0.5    Macula No overt CME, angulated vessels temporally, with thickening along the p.m. bundle.    Vessels Normal    Periphery Normal             IMAGING AND PROCEDURES  Imaging and Procedures for 04/28/21  Color Fundus Photography Optos - OU - Both Eyes       Right Eye Progression has no prior data. Disc findings include normal observations. Macula : microaneurysms. Vessels : normal observations. Periphery : normal observations.   Left Eye Progression has no prior data. Vessels : normal observations.  Periphery : normal observations.      OCT, Retina - OU - Both Eyes       Right Eye Quality was good. Scan locations included subfoveal. Central Foveal Thickness: 246. Progression has been stable. Findings include abnormal foveal contour, cystoid macular edema.   Left Eye Quality was good. Scan locations included subfoveal. Central Foveal Thickness: 232. Progression has improved. Findings include abnormal foveal contour.   Notes Cavitary CME, chronic over years because of the macular telangiectasis present for longstanding with a permanent shunt vessels perifoveal  Perimacular and perifoveal CME nasal to the FAZ, OD, has improved post Avastin No. 2 and also coincident with improved mask fit for CPAP  OS with perifoveal CME in the region of MAC-TEL confirmed by Fluorescein angiography again today           Fluorescein Angiography Optos (Transit OD)       Injection: 500 mg Fluorescein Sodium 10 %   Route: Intravenous   NDC: 4315-4008-67   Right Eye   Progression has improved. Mid/Late phase findings include microaneurysm, leakage.   Left Eye   Progression has improved. Early phase findings include window defect, leakage, microaneurysm. Mid/Late phase findings include leakage, window defect, microaneurysm.   Notes Significant MAC-TEL classic type II left eye With most leakage located temporally.  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  Some 3 months ago that with the mid to early May 2022.  He reports having a new memory foam type mask with better sealing over the last 2 and half to 3 months.  This is coincident with the time which CNVM.  Apparent Worsening of perifoveal macular edema developed and was treated as a CNVM  commencing February 17, 2021.  Now that perimacular CME nasal to the fovea has improved and this could be on the basis of improved CPAP compliance no leakage. We will thus observe today  Cystoid macular edema of both eyes Will observe today as patient has 2-1/2 to 3 months now improved mask fit using CPAP  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[place]) This was treated  as CNVM is a [place] papillary CNVM for the last 2 monthly sessions we will observe today     ICD-10-CM   1. 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HCC)  H35.3211 Color Fundus Photography Optos - OU - Both Eyes    OCT, Retina - OU - Both Eyes    Intravitreal Injection, Pharmacologic Agent - OD - Right Eye    Fluorescein Angiography Optos (Transit OD)    Fluorescein Sodium 10 % injection 500 mg    2. 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 Cystoid macular edema of both eyes  H35.353       1.  No change in acuity.  Decrease in perimacular CME nasal to the fovea of the right eye post Avastin.  This is also coincident with improved mask fit improved CPAP compliance of the last 2 and half to 3 months.  Because of the longstanding leakage is present of MAC-TEL OU will assume that this is improvement with CPAP compliance as there is less nightly macular hypoxia.  We will monitor this closely and look for signs of change over the next 4 weeks with OCT alone  2.  3.  Ophthalmic Meds Ordered this visit:  Meds ordered this encounter  Medications   Fluorescein Sodium 10 % injection 500 mg       Return in about 4 weeks (around 05/26/2021) for DILATE OU, OCT.  There are no Patient Instructions on file for this visit.   Explained the diagnoses, plan, and follow up with the patient and they expressed understanding.  Patient expressed understanding of the importance of proper follow up care.   Clent Demark  Rontae Inglett M.D. Diseases & Surgery of the Retina and Vitreous [facility] 04/28/21     Abbreviations: M myopia (nearsighted); A astigmatism; H hyperopia (farsighted); P presbyopia; Mrx spectacle prescription;  CTL contact lenses; OD right eye; OS left eye; OU both eyes  XT exotropia; ET esotropia; PEK punctate epithelial keratitis; PEE punctate epithelial erosions; DES dry eye syndrome; MGD meibomian gland dysfunction; ATs artificial tears; PFAT's preservative free artificial tears; [place] nuclear sclerotic cataract; PSC posterior subcapsular cataract; ERM epi-retinal membrane; PVD posterior vitreous detachment; RD retinal detachment; DM diabetes mellitus; DR diabetic retinopathy; NPDR non-proliferative diabetic retinopathy; PDR proliferative diabetic retinopathy; CSME clinically significant macular edema; DME diabetic macular edema; dbh dot blot hemorrhages; CWS cotton wool spot; POAG primary open angle glaucoma; C/D cup-to-disc ratio; HVF humphrey visual field; GVF goldmann visual field; OCT optical coherence tomography; IOP intraocular pressure; BRVO Branch retinal vein occlusion; CRVO central retinal vein occlusion; CRAO central retinal artery occlusion; BRAO branch retinal artery occlusion; RT retinal tear; SB scleral buckle; PPV pars plana vitrectomy; VH Vitreous hemorrhage; PRP panretinal laser photocoagulation; IVK intravitreal kenalog; VMT vitreomacular traction; MH Macular hole;  NVD neovascularization of the disc; NVE neovascularization elsewhere; AREDS age related eye disease study; ARMD age related macular degeneration; POAG primary open angle glaucoma; EBMD epithelial/anterior basement membrane dystrophy; ACIOL anterior chamber intraocular lens; IOL intraocular lens; PCIOL posterior chamber intraocular lens; Phaco/IOL phacoemulsification with intraocular lens placement; [place] photorefractive keratectomy; LASIK laser assisted in situ keratomileusis; HTN hypertension; DM diabetes  mellitus; COPD chronic obstructive pulmonary disease

## 2021-04-29 ENCOUNTER — Ambulatory Visit: Payer: Medicare HMO | Admitting: Physical Therapy

## 2021-04-29 ENCOUNTER — Other Ambulatory Visit: Payer: Self-pay

## 2021-04-29 DIAGNOSIS — R2681 Unsteadiness on feet: Secondary | ICD-10-CM | POA: Diagnosis not present

## 2021-04-29 DIAGNOSIS — R2689 Other abnormalities of gait and mobility: Secondary | ICD-10-CM

## 2021-04-29 DIAGNOSIS — A0472 Enterocolitis due to Clostridium difficile, not specified as recurrent: Secondary | ICD-10-CM | POA: Diagnosis not present

## 2021-04-29 DIAGNOSIS — M6281 Muscle weakness (generalized): Secondary | ICD-10-CM | POA: Diagnosis not present

## 2021-04-29 LAB — CLOSTRIDIUM DIFFICILE BY PCR

## 2021-04-29 NOTE — Therapy (Signed)
[place], [place], [address] Phone: [phone]   Fax:  [phone]  Physical Therapy Treatment  Patient Details  Name: [name] Pratt MRN: [id number] Date of Birth: 04/18/1941 Referring Provider (PT): [name], NP   Encounter Date: 04/29/2021   PT End of Session - 04/29/21 1521     Visit Number 8    Number of Visits 12    Date for PT Re-Evaluation 05/10/21    Authorization Type [name]- requested/ approved thru 8/30    Authorization - Visit Number 8    Authorization - Number of Visits 12    Progress Note Due on Visit 10    PT Start Time [id number]    PT Stop Time 1525    PT Time Calculation (min) 40 min    Activity Tolerance Patient tolerated treatment well    Behavior During Therapy [facility] for tasks assessed/performed             Past Medical History:  Diagnosis Date   Aortic atherosclerosis ([place]) 03/05/2020   Arthritis    Cancer ([place])    skin cancer   Carotid stenosis    a. Carotid U/S [id number]: LICA < [id number], RICA 0000000;  b.  Carotid U/S 0000000:  RICA 123456; LICA [id number]; f/u 1 year   Chest pain     Myoview in 2008 was normal.  Echo 7/09: EF 60%, normal wall motion, mild LVH, mild LAE.   Complication of anesthesia    "stomach does not wake up"   COPD (chronic obstructive pulmonary disease) (HCC)    Emphysema, unspecified ([place]) 03/05/2020   GERD (gastroesophageal reflux disease)    Gout    History of chicken pox    History of hemorrhoids    History of kidney stones    Hyperlipidemia    under control   Hypertension    under control   Hypothyroidism    Ileus ([place])    Inguinal hernia    OSA (obstructive sleep apnea)    Pancreatitis    PONV (postoperative nausea and vomiting)    Rosacea    SIRS (systemic inflammatory response syndrome) ([place]) 03/04/2020   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    Past Surgical History:   Procedure Laterality Date   ABDOMINAL HERNIA REPAIR  07/15/12   Dr [name]   ANTERIOR CERVICAL DECOMP/DISCECTOMY FUSION N/A 07/09/2020   Procedure: ANTERIOR CERVICAL DECOMPRESSION AND FUSION CERVICAL THREE-FOUR.;  Surgeon: [name], MD;  Location: [place];  Service: Neurosurgery;  Laterality: N/A;  anterior   BROW LIFT  05/07/01   CYSTOSCOPY WITH URETEROSCOPY AND STENT PLACEMENT Right 01/28/2014   Procedure: CYSTOSCOPY WITH URETEROSCOPY, BASKET RETRIVAL AND  STENT PLACEMENT;  Surgeon: [name], MD;  [facility];  Service: Urology;  Laterality: Right;   epidural injections     multiple procedures   ESOPHAGEAL DILATION  1993 and 1994   multiple times   EYE SURGERY Bilateral 03/25/10, 2012   cataract removal   HEMORRHOID SURGERY  2014   HIATAL HERNIA REPAIR  12/21/93   HOLMIUM LASER APPLICATION Right [id number]   Procedure: HOLMIUM LASER APPLICATION;  Surgeon: [name], MD;  [facility];  Service: Urology;  Laterality: Right;   INGUINAL HERNIA REPAIR Right 07/15/12   Dr [name], x2   JOINT REPLACEMENT  07/25/04   right knee   JOINT REPLACEMENT  07/13/10   left knee  [place]   LAPAROSCOPIC CHOLECYSTECTOMY  09/20/06   with intraoperative cholangiogram and right inguinal herniorrhaphy with mesh    LIGAMENT REPAIR Left 07/2013   shoulder   LITHOTRIPSY     x2   [place] FUSION/FORAMINOTOMY N/A 11/24/2020   Procedure: Posterior Cervical Laminectomy Cervical three-four, Cervical four-five with lateral mass fusion;  Surgeon: [name], MD;  Location: [place];  Service: Neurosurgery;  Laterality: N/A;   REFRACTIVE SURGERY Bilateral    Right total hip replacement  4/14   SKIN CANCER DESTRUCTION     nose, ear   TONSILLECTOMY  as child   TOTAL HIP ARTHROPLASTY Left    URETHRAL DILATION  1991   [name]    There were no vitals filed for this visit.   Subjective Assessment - 04/29/21 1450     Subjective Pt states he is  having more back pain especially with walking    Patient Stated Goals "walking good and have good balance."    Currently in Pain? Yes    Pain Score 8     Pain Location Back    Pain Orientation Lower    Pain Descriptors / Indicators Aching;Sore    Pain Type Chronic pain                               [facility] Adult PT Treatment/Exercise - 04/29/21 0001       High Level Balance   High Level Balance Comments standing balance on foam head turns laterally 2 x 30 sec, up/down 2 x 30 sec, tandem stance on foam with min/mod A x 30 sec, tandem stance on ground 2 x 30 sec bilat      Knee/Hip Exercises: Standing   Heel Raises 20 reps    Hip Abduction Stengthening;Both;2 sets;10 reps    Hip Extension Stengthening;Both;2 sets;10 reps    Extension Limitations cues for posture    Lateral Step Up Right;Left;10 reps;Hand Hold: 1;Step Height: 4"    Forward Step Up Right;Left;Hand Hold: 1;Step Height: 4";10 reps    Functional Squat 20 reps    Rocker Board 1 minute    Rocker Board Limitations 1 min each laterally and A/P    Other Standing Knee Exercises stepping sideways over noodles with UE support x 15 bilat, stepping forward/backward over noodles x 15 bilat with 1 UE support and CGA      Knee/Hip Exercises: Seated   Sit to Sand 15 reps;without UE support                         PT Long Term Goals - 03/29/21 1129       PT LONG TERM GOAL #1   Title The patient will be indep with HEP.    Time 6    Period Weeks    Target Date 05/10/21      PT LONG TERM GOAL #2   Title The patient will improve Berg balance scale from 39/56 up to 45/56.    Time 6    Period Weeks    Target Date 05/10/21      PT LONG TERM GOAL #3   Title The patient will improve gait speed from 2.35 ft/sec to > or equal to 2.62 ft/sec to demo improved community moiblity.    Time 6    Period Weeks    Target Date 05/10/21  PT LONG TERM GOAL #4   Title The patient will decrease TUG from  16.86 seconds to < or equal to 14 seconds to demo improving mobility.    Time 6    Period Weeks    Target Date 05/10/21                   Plan - 04/29/21 1522     Clinical Impression Statement Session modified due to increased back pain, increased focus on strength and static balance    PT Next Visit Plan progress dynamic gait and balance, hip strengthening, update HEP    PT Home Exercise Plan Access Code: [email or web address]    Consulted and Agree with Plan of Care Patient             Patient will benefit from skilled therapeutic intervention in order to improve the following deficits and impairments:     Visit Diagnosis: Muscle weakness (generalized)  Other abnormalities of gait and mobility  Unsteadiness on feet     Problem List Patient Active Problem List   Diagnosis Date Noted   Nasal congestion 04/11/2021   C. 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[place]) 02/09/2021   Visual hallucinations 11/30/2020   Cervical myelopathy ([place]) 11/24/2020   Cystoid macular edema of both eyes 07/28/2020   Status post cervical spinal fusion 07/09/2020   History of total bilateral knee replacement 07/08/2020   Aortic atherosclerosis ([place]) 03/05/2020   Emphysema, unspecified ([place]) 03/05/2020   Heme positive stool 02/29/2020   Leg cramps 11/13/2019   Tibialis anterior tendon tear, nontraumatic 11/13/2019   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   CPAP (continuous positive airway pressure) dependence 03/25/2018   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   Acute idiopathic gout of hand 03/25/2018   Erectile dysfunction 06/20/2017   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 03/06/2017    Peripheral neuropathy 06/19/2016   Hearing loss 02/02/2016   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   Allergic rhinitis 02/02/2016   Atypical chest pain 11/24/2015   Preventative health care 11/24/2015   Onychomycosis 06/30/2015   Degenerative disc disease, lumbar 06/30/2015   Other allergic rhinitis 02/18/2015   Deviated nasal septum 02/18/2015   OSA on CPAP 02/18/2015   RLS (restless legs syndrome) 02/18/2015   GERD (gastroesophageal reflux disease) 09/18/2014   Hyperglycemia 03/03/2014   Sleep apnea with use of continuous positive airway pressure (CPAP) 07/15/2013   Carotid stenosis 02/24/2013   Nonspecific abnormal electrocardiogram (ECG) (EKG) 01/06/2013   DJD (degenerative joint disease) of hip 12/24/2012   Lower GI bleed 12/12/2012   Dyspnea 06/12/2011   Hypothyroidism 05/13/2009   HYPERCHOLESTEROLEMIA 05/13/2009   Hyperlipidemia 05/13/2009   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   Essential hypertension 05/13/2009   INGUINAL HERNIA 05/13/2009   HIATAL HERNIA 05/13/2009   NEPHROLITHIASIS 05/13/2009   ROSACEA 05/13/2009   Aubriana Ravelo, PT  Maximilliano Kersh 04/29/2021, 3:27 PM  [facility] [place] [address] [place], [place], [address] Phone: [phone]   Fax:  [phone]  Name: SIRR MARQUEZ MRN: [id number] Date of Birth: 08-03-1941

## 2021-05-02 DIAGNOSIS — N3941 Urge incontinence: Secondary | ICD-10-CM | POA: Diagnosis not present

## 2021-05-04 ENCOUNTER — Other Ambulatory Visit: Payer: Self-pay

## 2021-05-04 ENCOUNTER — Ambulatory Visit: Payer: Medicare HMO | Admitting: Physical Therapy

## 2021-05-04 DIAGNOSIS — M6281 Muscle weakness (generalized): Secondary | ICD-10-CM | POA: Diagnosis not present

## 2021-05-04 DIAGNOSIS — R2681 Unsteadiness on feet: Secondary | ICD-10-CM | POA: Diagnosis not present

## 2021-05-04 DIAGNOSIS — R2689 Other abnormalities of gait and mobility: Secondary | ICD-10-CM

## 2021-05-04 NOTE — Therapy (Signed)
[place], [place], [address] Phone: [phone]   Fax:  [phone]  Physical Therapy Treatment  Patient Details  Name: [name] Pratt MRN: [id number] Date of Birth: 05-22-1941 Referring Provider (PT): [name], NP   Encounter Date: 05/04/2021   PT End of Session - 05/04/21 1011     Visit Number 9    Number of Visits 12    Date for PT Re-Evaluation 05/10/21    Authorization Type [name]- requested/ approved thru 8/30    Authorization - Visit Number 9    Authorization - Number of Visits 12    Progress Note Due on Visit 10    PT Start Time 0930    PT Stop Time 1012    PT Time Calculation (min) 42 min    Equipment Utilized During Treatment Gait belt    Activity Tolerance Patient tolerated treatment well    Behavior During Therapy [facility] for tasks assessed/performed             Past Medical History:  Diagnosis Date   Aortic atherosclerosis ([place]) 03/05/2020   Arthritis    Cancer ([place])    skin cancer   Carotid stenosis    a. Carotid U/S [id number]: LICA < [id number], RICA 0000000;  b.  Carotid U/S 0000000:  RICA 123456; LICA [id number]; f/u 1 year   Chest pain     Myoview in 2008 was normal.  Echo 7/09: EF 60%, normal wall motion, mild LVH, mild LAE.   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    Past Surgical History:  Procedure Laterality Date   ABDOMINAL HERNIA REPAIR  07/15/12   Dr [name]   ANTERIOR CERVICAL DECOMP/DISCECTOMY FUSION N/A 07/09/2020   Procedure: ANTERIOR CERVICAL DECOMPRESSION AND FUSION CERVICAL THREE-FOUR.;  Surgeon: [name], MD;  Location: [place];  Service: Neurosurgery;  Laterality: N/A;  anterior   BROW LIFT  05/07/01   CYSTOSCOPY WITH URETEROSCOPY AND STENT PLACEMENT Right 01/28/2014   Procedure: CYSTOSCOPY WITH URETEROSCOPY, BASKET RETRIVAL AND  STENT PLACEMENT;  Surgeon: [name], MD;  [facility];  Service: Urology;  Laterality: Right;   epidural injections     multiple procedures   ESOPHAGEAL DILATION  1993 and 1994   multiple times   EYE SURGERY Bilateral 03/25/10, 2012   cataract removal   HEMORRHOID SURGERY  2014   HIATAL HERNIA REPAIR  12/21/93   HOLMIUM LASER APPLICATION Right [id number]   Procedure: HOLMIUM LASER APPLICATION;  Surgeon: [name], MD;  [facility];  Service: Urology;  Laterality: Right;   INGUINAL HERNIA REPAIR Right 07/15/12   Dr [name], x2   JOINT REPLACEMENT  07/25/04   right knee   JOINT  REPLACEMENT  07/13/10   left knee   KNEE SURGERY  1995   LAPAROSCOPIC CHOLECYSTECTOMY  09/20/06   with intraoperative cholangiogram and right inguinal herniorrhaphy with mesh    LIGAMENT REPAIR Left 07/2013   shoulder   LITHOTRIPSY     x2   [place] FUSION/FORAMINOTOMY N/A 11/24/2020   Procedure: Posterior Cervical Laminectomy Cervical three-four, Cervical four-five with lateral mass fusion;  Surgeon: [name], MD;  Location: [place];  Service: Neurosurgery;  Laterality: N/A;   REFRACTIVE SURGERY Bilateral    Right total hip replacement  4/14   SKIN CANCER DESTRUCTION     nose, ear   TONSILLECTOMY  as child   TOTAL HIP ARTHROPLASTY Left    URETHRAL DILATION  1991   [name]    There were no vitals filed for this visit.   Subjective Assessment -  05/04/21 0930     Subjective Pt states he has less back pain today. States he saw his doctor and his c-diff is no longer contagious    Patient Stated Goals "walking good and have good balance."    Currently in Pain? Yes    Pain Score 6     Pain Location Back    Pain Orientation Lower    Pain Descriptors / Indicators Aching    Pain Type Chronic pain                               [facility] Adult PT Treatment/Exercise - 05/04/21 0001       Ambulation/Gait   Gait Comments gait with obstacle negotiation stepping up and over objects. and tight turns with min A and cues for foot clearance with tight turns. gait with head turns up/down and laterally with pt requiring CGA for up/down, min A for lateral head turns      High Level Balance   High Level Balance Comments standing on foam head turns laterally and up/down both 2 x 30 sec, tandem stance on ground 2 x 30 sec with min A      Knee/Hip Exercises: Standing   Heel Raises 20 reps    Lateral Step Up Right;Left;Hand Hold: 1;10 reps;Step Height: 8"    Forward Step Up Right;Left;Hand Hold: 1;10 reps;Step Height: 8"    Functional Squat 20 reps    Rocker Board 1 minute    Rocker Board Limitations 1 min each laterally and A/P                         PT Long Term Goals - 03/29/21 1129       PT LONG TERM GOAL #1   Title The patient will be indep with HEP.    Time 6    Period Weeks    Target Date 05/10/21      PT LONG TERM GOAL #2   Title The patient will improve Berg balance scale from 39/56 up to 45/56.    Time 6    Period Weeks    Target Date 05/10/21      PT LONG TERM GOAL #3   Title The patient will improve gait speed from 2.35 ft/sec to > or equal to 2.62 ft/sec to demo improved community moiblity.    Time 6    Period Weeks    Target Date 05/10/21      PT LONG TERM GOAL #4   Title  The patient will decrease TUG from 16.86 seconds to < or equal to 14 seconds to demo improving mobility.    Time  6    Period Weeks    Target Date 05/10/21                   Plan - 05/04/21 1012     Clinical Impression Statement Pt with difficulty with tight turns and with head turns laterally with gait, requires min A. Pt with improving performance with step ups and static balance activities    PT Next Visit Plan [id number] visit note, recert, dynamic gait and balance    PT Home Exercise Plan Access Code: [email or web address]    Consulted and Agree with Plan of Care Patient             Patient will benefit from skilled therapeutic intervention in order to improve the following deficits and impairments:     Visit Diagnosis: Muscle weakness (generalized)  Other abnormalities of gait and mobility  Unsteadiness on feet     Problem List Patient Active Problem List   Diagnosis Date Noted   Nasal congestion 04/11/2021   C. 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[place]) 02/09/2021   Visual hallucinations 11/30/2020   Cervical myelopathy ([place]) 11/24/2020   Cystoid macular edema of both eyes 07/28/2020   Status post cervical spinal fusion 07/09/2020   History of total bilateral knee replacement 07/08/2020   Aortic atherosclerosis ([place]) 03/05/2020   Emphysema, unspecified ([place]) 03/05/2020   Heme positive stool 02/29/2020   Leg cramps 11/13/2019   Tibialis anterior tendon tear, nontraumatic 11/13/2019   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   CPAP (continuous positive airway pressure) dependence 03/25/2018   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   Acute idiopathic gout of hand 03/25/2018   Erectile dysfunction 06/20/2017   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 03/06/2017   Peripheral  neuropathy 06/19/2016   Hearing loss 02/02/2016   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   Allergic rhinitis 02/02/2016   Atypical chest pain 11/24/2015   Preventative health care 11/24/2015   Onychomycosis 06/30/2015   Degenerative disc disease, lumbar 06/30/2015   Other allergic rhinitis 02/18/2015   Deviated nasal septum 02/18/2015   OSA on CPAP 02/18/2015   RLS (restless legs syndrome) 02/18/2015   GERD (gastroesophageal reflux disease) 09/18/2014   Hyperglycemia 03/03/2014   Sleep apnea with use of continuous positive airway pressure (CPAP) 07/15/2013   Carotid stenosis 02/24/2013   Nonspecific abnormal electrocardiogram (ECG) (EKG) 01/06/2013   DJD (degenerative joint disease) of hip 12/24/2012   Lower GI bleed 12/12/2012   Dyspnea 06/12/2011   Hypothyroidism 05/13/2009   HYPERCHOLESTEROLEMIA 05/13/2009   Hyperlipidemia 05/13/2009   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   Essential hypertension 05/13/2009   INGUINAL HERNIA 05/13/2009   HIATAL HERNIA 05/13/2009   NEPHROLITHIASIS 05/13/2009   ROSACEA 05/13/2009   Teaira Croft, PT  Mathilda Maguire 05/04/2021, 10:15 AM  [facility] [place], [place], [address] Phone: [phone]   Fax:  [phone]  Name: DAAIEL BEARDMORE MRN: [id number] Date of Birth: February 29, 1941

## 2021-05-06 ENCOUNTER — Other Ambulatory Visit: Payer: Self-pay

## 2021-05-06 ENCOUNTER — Encounter: Payer: Self-pay | Admitting: Rehabilitative and Restorative Service Providers"

## 2021-05-06 ENCOUNTER — Ambulatory Visit: Payer: Medicare HMO | Admitting: Rehabilitative and Restorative Service Providers"

## 2021-05-06 DIAGNOSIS — R2681 Unsteadiness on feet: Secondary | ICD-10-CM | POA: Diagnosis not present

## 2021-05-06 DIAGNOSIS — R2689 Other abnormalities of gait and mobility: Secondary | ICD-10-CM | POA: Diagnosis not present

## 2021-05-06 DIAGNOSIS — M6281 Muscle weakness (generalized): Secondary | ICD-10-CM | POA: Diagnosis not present

## 2021-05-06 NOTE — Therapy (Signed)
[place], [place], [address] Phone: [phone]   Fax:  [phone]  Physical Therapy Treatment and Progress NOte  Patient Details  Name: [name] Pratt MRN: [id number] Date of Birth: 05/28/1941 Referring Provider (PT): [name], NP  Physical Therapy Progress Note   Dates of Reporting Period:03/29/21 to 05/06/21   Objective Measurements: see notes below  Reason Skilled Services are Required: PT to modify HEP next visit and have patient continue working on HEP with f/u in 3 weeks.  PT will continue to assess need for skilled services.  Thank you for the referral of this patient.   Encounter Date: 05/06/2021   PT End of Session - 05/06/21 1355     Visit Number 10    Number of Visits 12    Date for PT Re-Evaluation 05/10/21    Authorization Type [name]- requested/ approved thru 8/30    Authorization - Visit Number 10    Authorization - Number of Visits 12    Progress Note Due on Visit 10    PT Start Time 1448    PT Stop Time 1438    PT Time Calculation (min) 43 min    Equipment Utilized During Treatment Gait belt    Activity Tolerance Patient tolerated treatment well    Behavior During Therapy WFL for tasks assessed/performed             Past Medical History:  Diagnosis Date   Aortic atherosclerosis ([place]) 03/05/2020   Arthritis    Cancer ([place])    skin cancer   Carotid stenosis    a. Carotid U/S 1/85: LICA < 63%, RICA 14-97%;  b.  Carotid U/S 0/26:  RICA 37-85%; LICA 8-85%; f/u 1 year   Chest pain     Myoview in 2008 was normal.  Echo 7/09: EF 60%, normal wall motion, mild LVH, mild LAE.   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    Past Surgical History:  Procedure Laterality Date   ABDOMINAL HERNIA REPAIR  07/15/12   Dr [name]   ANTERIOR CERVICAL DECOMP/DISCECTOMY FUSION N/A 07/09/2020   Procedure: ANTERIOR CERVICAL DECOMPRESSION AND FUSION CERVICAL THREE-FOUR.;  Surgeon: [name], MD;  Location: [facility];  Service: Neurosurgery;  Laterality: N/A;  anterior   BROW LIFT  05/07/01   CYSTOSCOPY WITH URETEROSCOPY AND STENT PLACEMENT Right 01/28/2014   Procedure: CYSTOSCOPY WITH URETEROSCOPY, BASKET RETRIVAL AND  STENT PLACEMENT;  Surgeon: [name], MD;  [facility];  Service: Urology;  Laterality: Right;   epidural injections     multiple procedures   ESOPHAGEAL DILATION  1993 and 1994   multiple times   EYE SURGERY Bilateral 03/25/10, 2012   cataract removal  HEMORRHOID SURGERY  2014   HIATAL HERNIA REPAIR  12/21/93   HOLMIUM LASER APPLICATION Right 05/27/9449   Procedure: HOLMIUM LASER APPLICATION;  Surgeon: [name], MD;  [facility];  Service: Urology;  Laterality: Right;   INGUINAL HERNIA REPAIR Right 07/15/12   Dr [name], x2   JOINT REPLACEMENT  07/25/04   right knee   JOINT REPLACEMENT  07/13/10   left knee   [place]  09/20/06   with intraoperative cholangiogram and right inguinal herniorrhaphy with mesh    LIGAMENT REPAIR Left 07/2013   shoulder   LITHOTRIPSY     x2   [place] FUSION/FORAMINOTOMY N/A 11/24/2020   Procedure: Posterior Cervical Laminectomy Cervical three-four, Cervical four-five with lateral mass  fusion;  Surgeon: [name], MD;  Location: [facility];  Service: Neurosurgery;  Laterality: N/A;   REFRACTIVE SURGERY Bilateral    Right total hip replacement  4/14   SKIN CANCER DESTRUCTION     nose, ear   TONSILLECTOMY  as child   TOTAL HIP ARTHROPLASTY Left    URETHRAL DILATION  1991   [name]    There were no vitals filed for this visit.   Subjective Assessment - 05/06/21 1402     Subjective The patient reports that he has pain that is moving around in the low back and hips.  He gets "aching pain" across the low back.  He is getting muscle spasms in his back.  He feels that his balance is about the same as when he began PT.  He feels standing up straight is easier now.    Pertinent History spondylitic myelopathy, ACDF, HTN, COPD, cervical decompression, sleep apnea    Patient Stated Goals "walking good and have good balance."    Currently in Pain? No/denies   None at rest               [facility] PT Assessment - 05/06/21 1404       Assessment   Medical Diagnosis gait disorder    Referring Provider (PT) [name], NP    Onset Date/Surgical Date 11/14/20      Ambulation/Gait   Gait velocity 2.07 ft/sec      Timed Up and Go Test   TUG --   16.9 seconds                          [facility] Adult PT Treatment/Exercise - 05/06/21 1404       Ambulation/Gait   Ambulation/Gait Yes    Ambulation/Gait Assistance 6: Modified independent (Device/Increase time)    Ambulation/Gait Assistance Details Patient had one episode of L foot catch requiring CGA to recover    Ambulation Distance (Feet) 240 Feet   x 2 reps   Assistive device Straight cane    Gait Comments gait with cues for heel strike bilaterally (pt was initially vaulting for greater R foot clearance).      High Level Balance   High Level Balance Comments Standing coordination stepping through agility ladder wide to narrow with min A for safety, stepping anteriorly/posteriorly into ladder with min A,   Stepping up with turns on 3" step R and L sides x 10 reps      Self-Care   Self-Care Other Self-Care Comments    Other Self-Care Comments  discussed PT goals, use of elliptical at home and routine for HEP  Exercises   Exercises Knee/Hip      Knee/Hip Exercises: Aerobic   Nustep Level 5 x UE/sLEs x 5 minutes                         PT Long Term Goals - 05/06/21 1401       PT LONG TERM GOAL #1   Title The patient will be indep with HEP.    Time 6    Period Weeks    Target Date 05/10/21      PT LONG TERM GOAL #2   Title The patient will improve Berg balance scale from 39/56 up to 45/56.    Baseline re-tested and patient scored 39/56    Time 6    Period Weeks    Status Not Met      PT LONG TERM GOAL #3   Title The patient will improve gait speed from 2.35 ft/sec to > or equal to 2.62 ft/sec to demo improved community moiblity.    Baseline 2.07 ft/sec    Time 6    Period Weeks    Status Not Met      PT LONG TERM GOAL #4   Title The patient will decrease TUG from 16.86 seconds to < or equal to 14 seconds to demo improving mobility.    Baseline 16.9 seconds at goal assessment 05/06/21    Time 6    Period Weeks    Status Not Met                   Plan - 05/06/21 1626     Clinical Impression Statement The patient and PT discussed that he feels that overall symptoms are similar to when he began PT. This is confirmed by objective measurements for Berg, TUG, and gait speed.  PT and patient discussed next steps for PT and PT recommended that we focus on home routine adding elliptical (he notes he can safely do this at home) and ensuring current HEP from PT is challenging-- we then may checkin in 2-3 weeks to reassess progress with HEP.  He has a change in status noting low back achiness (intermittent in nature) is moving and is different each day.    PT Treatment/Interventions ADLs/Self Care Home Management;Patient/family education;Balance  training;Neuromuscular re-education;Gait training;Therapeutic activities;Therapeutic exercise;Functional mobility training;Taping;Manual techniques    PT Next Visit Plan Reiew HEP and update/progress as indicated.  Focus/emphasize HEP working on patient adding elliptical to updated HEP.  Anticipate holding 3 weeks and then reassess to allow patient more time with HEP to see if he makes progress.  No renewal completed today since current renewal will cover thru 8/30 and then plan to reasses in 3 weeks and will renew/ recert with [employer] that date.    PT Home Exercise Plan Access Code: [email or web address]    Consulted and Agree with Plan of Care Patient             Patient will benefit from skilled therapeutic intervention in order to improve the following deficits and impairments:  Pain, Decreased activity tolerance, Decreased balance, Decreased strength, Decreased range of motion, Impaired flexibility, Impaired tone, Increased fascial restricitons  Visit Diagnosis: Muscle weakness (generalized)  Other abnormalities of gait and mobility  Unsteadiness on feet     Problem List Patient Active Problem List   Diagnosis Date Noted   Nasal congestion 04/11/2021   C. difficile colitis 04/11/2021   Diarrhea 03/02/2021   Lumbar radiculopathy 03/02/2021   Gait  disorder 03/02/2021   Hip pain 03/02/2021   Incontinence of feces 03/02/2021   Situational depression 03/02/2021   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[place]) 02/09/2021   Visual hallucinations 11/30/2020   Cervical myelopathy ([place]) 11/24/2020   Cystoid macular edema of both eyes 07/28/2020   Status post cervical spinal fusion 07/09/2020   History of total bilateral knee replacement 07/08/2020   Aortic atherosclerosis ([place]) 03/05/2020   Emphysema, unspecified (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   Allergic rhinitis 02/02/2016   Atypical chest pain 11/24/2015   Preventative health care 11/24/2015   Onychomycosis 06/30/2015   Degenerative disc disease, lumbar 06/30/2015   Other allergic rhinitis 02/18/2015   Deviated nasal septum 02/18/2015   OSA on CPAP 02/18/2015   RLS (restless legs syndrome) 02/18/2015   GERD (gastroesophageal reflux disease) 09/18/2014   Hyperglycemia 03/03/2014   Sleep apnea with use of continuous positive airway pressure (CPAP) 07/15/2013   Carotid stenosis 02/24/2013   Nonspecific abnormal electrocardiogram (ECG) (EKG) 01/06/2013   DJD (degenerative joint disease) of hip 12/24/2012   Lower GI bleed 12/12/2012   Dyspnea 06/12/2011   Hypothyroidism 05/13/2009   HYPERCHOLESTEROLEMIA 05/13/2009   Hyperlipidemia 05/13/2009   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   Essential hypertension 05/13/2009   INGUINAL HERNIA 05/13/2009   HIATAL HERNIA 05/13/2009   NEPHROLITHIASIS 05/13/2009   ROSACEA 05/13/2009   Thank you for the referral of this patient. [name] Pratt PT, MPT   [name] Pratt 05/06/2021, 4:34 PM  [facility] [place], [place], [address] Phone: [phone]   Fax:  [phone]  Name: [name] Pratt MRN: [id number] Date of Birth: 05-Feb-1941

## 2021-05-09 DIAGNOSIS — M461 Sacroiliitis, not elsewhere classified: Secondary | ICD-10-CM | POA: Diagnosis not present

## 2021-05-09 DIAGNOSIS — N3941 Urge incontinence: Secondary | ICD-10-CM | POA: Diagnosis not present

## 2021-05-09 DIAGNOSIS — M47816 Spondylosis without myelopathy or radiculopathy, lumbar region: Secondary | ICD-10-CM | POA: Diagnosis not present

## 2021-05-10 ENCOUNTER — Ambulatory Visit: Payer: Medicare HMO | Admitting: Physical Therapy

## 2021-05-10 ENCOUNTER — Encounter: Payer: Self-pay | Admitting: Physical Therapy

## 2021-05-10 ENCOUNTER — Other Ambulatory Visit: Payer: Self-pay

## 2021-05-10 DIAGNOSIS — R29818 Other symptoms and signs involving the nervous system: Secondary | ICD-10-CM

## 2021-05-10 DIAGNOSIS — M6281 Muscle weakness (generalized): Secondary | ICD-10-CM | POA: Diagnosis not present

## 2021-05-10 DIAGNOSIS — R2689 Other abnormalities of gait and mobility: Secondary | ICD-10-CM | POA: Diagnosis not present

## 2021-05-10 DIAGNOSIS — R2681 Unsteadiness on feet: Secondary | ICD-10-CM | POA: Diagnosis not present

## 2021-05-10 NOTE — Therapy (Signed)
[place], [place], [address] Phone: [phone]   Fax:  [phone]  Physical Therapy Treatment  Patient Details  Name: [name] Pratt MRN: [id number] Date of Birth: Aug 11, 1941 Referring Provider (PT): [name], NP   Encounter Date: 05/10/2021   PT End of Session - 05/10/21 0933     Visit Number 11    Number of Visits 12    Date for PT Re-Evaluation 05/10/21    Authorization Type [name]- requested/ approved thru 8/30    Authorization - Visit Number 11    Authorization - Number of Visits 12    Progress Note Due on Visit 10    PT Start Time 0934    PT Stop Time 1016    PT Time Calculation (min) 42 min    Equipment Utilized During Treatment Gait belt    Activity Tolerance Patient tolerated treatment well    Behavior During Therapy [facility] for tasks assessed/performed             Past Medical History:  Diagnosis Date   Aortic atherosclerosis ([place]) 03/05/2020   Arthritis    Cancer ([place])    skin cancer   Carotid stenosis    a. Carotid U/S 4/58: LICA < 09%, RICA 98-33%;  b.  Carotid U/S 8/25:  RICA 05-39%; LICA 7-67%; f/u 1 year   Chest pain     Myoview in 2008 was normal.  Echo 7/09: EF 60%, normal wall motion, mild LVH, mild LAE.   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    Past Surgical History:  Procedure Laterality Date   ABDOMINAL HERNIA REPAIR  07/15/12   Dr [name]   ANTERIOR CERVICAL DECOMP/DISCECTOMY FUSION N/A 07/09/2020   Procedure: ANTERIOR CERVICAL DECOMPRESSION AND FUSION CERVICAL THREE-FOUR.;  Surgeon: [name], MD;  Location: [facility];  Service: Neurosurgery;  Laterality: N/A;  anterior   BROW LIFT  05/07/01   CYSTOSCOPY WITH URETEROSCOPY AND STENT PLACEMENT Right 01/28/2014   Procedure: CYSTOSCOPY WITH URETEROSCOPY, BASKET RETRIVAL AND  STENT PLACEMENT;  Surgeon: [name], MD;  [facility];  Service: Urology;  Laterality: Right;   epidural injections     multiple procedures   ESOPHAGEAL DILATION  1993 and 1994   multiple times   EYE SURGERY Bilateral 03/25/10, 2012   cataract removal   HEMORRHOID SURGERY  2014   HIATAL HERNIA REPAIR  12/21/93   HOLMIUM LASER APPLICATION Right 3/41/9379   Procedure: HOLMIUM LASER APPLICATION;  Surgeon: [name], MD;  [facility];  Service: Urology;  Laterality: Right;   INGUINAL HERNIA REPAIR Right 07/15/12   Dr [name], x2   JOINT REPLACEMENT  07/25/04   right knee   JOINT  REPLACEMENT  07/13/10   left knee   KNEE SURGERY  1995   LAPAROSCOPIC CHOLECYSTECTOMY  09/20/06   with intraoperative cholangiogram and right inguinal herniorrhaphy with mesh    LIGAMENT REPAIR Left 07/2013   shoulder   LITHOTRIPSY     x2   [place] FUSION/FORAMINOTOMY N/A 11/24/2020   Procedure: Posterior Cervical Laminectomy Cervical three-four, Cervical four-five with lateral mass fusion;  Surgeon: [name], MD;  Location: [facility];  Service: Neurosurgery;  Laterality: N/A;   REFRACTIVE SURGERY Bilateral    Right total hip replacement  4/14   SKIN CANCER DESTRUCTION     nose, ear   TONSILLECTOMY  as child   TOTAL HIP ARTHROPLASTY Left    URETHRAL DILATION  1991   [name]    There were no vitals filed for this visit.   Subjective Assessment -  05/10/21 0938     Subjective Pt reports continued concern about his weakness in LE and balance.  He is having a hard time getting "all the exercises in" during day.  He complains of stiffness after sitting a while.  He is using his eliptical for 5 min, "but it wears me out". He states he will be getting an injection in his hip soon.    Pertinent History spondylitic myelopathy, ACDF, HTN, COPD, cervical decompression, sleep apnea    Patient Stated Goals "walking good and have good balance."    Currently in Pain? No/denies   none at rest.               [facility] PT Assessment - 05/10/21 0001       Assessment   Medical Diagnosis gait disorder    Referring Provider (PT) [name], NP    Onset Date/Surgical Date 11/14/20    Hand Dominance Right               [facility] Adult PT Treatment/Exercise - 05/10/21 0001       Knee/Hip Exercises: Stretches   Passive Hamstring Stretch Right;Left;2 reps;30 seconds   seated with hip hinge   Hip Flexor Stretch Right;Left;2 reps;10 seconds   standing with leg extended, cues for posture   Hip Flexor Stretch Limitations trialed seated, but post leg cramps when attempting.      Knee/Hip Exercises: Aerobic   Nustep Level 5 x UE/sLEs x 6 minutes      Knee/Hip Exercises: Seated   Sit to Sand 1 set;10 reps;without UE support   eccentric lowering            Balance Exercises - 05/10/21 0001       Balance Exercises: Standing   Retro Gait 1 rep;Upper extremity support    Sidestepping Upper extremity support;2 reps   with increased step height   Marching Solid surface;Upper extremity assist 1;Forwards    Other Standing Exercises SLS with toe taps forward, side, and backward x 5 reps each leg, with UE support on counter               PT Education - 05/10/21 1016     Education Details HEP modified.    Person(s) Educated Patient    Methods Explanation;Handout;Demonstration;Verbal cues    Comprehension Returned  demonstration;Verbalized understanding                 PT Long Term Goals - 05/06/21 1401       PT LONG TERM GOAL #1   Title The patient will be indep with  HEP.    Time 6    Period Weeks    Target Date 05/10/21      PT LONG TERM GOAL #2   Title The patient will improve Berg balance scale from 39/56 up to 45/56.    Baseline re-tested and patient scored 39/56    Time 6    Period Weeks    Status Not Met      PT LONG TERM GOAL #3   Title The patient will improve gait speed from 2.35 ft/sec to > or equal to 2.62 ft/sec to demo improved community moiblity.    Baseline 2.07 ft/sec    Time 6    Period Weeks    Status Not Met      PT LONG TERM GOAL #4   Title The patient will decrease TUG from 16.86 seconds to < or equal to 14 seconds to demo improving mobility.    Baseline 16.9 seconds at goal assessment 05/06/21    Time 6    Period Weeks    Status Not Met                   Plan - 05/10/21 1243     Clinical Impression Statement Reviewed current HEP and added one balance exercise with UE support on counter; all were tolerated well.  He continues to require UE support on counter for balance.  Pt to continue working on HEP over next 3 wks and will return to assess progress/ decline.    PT Frequency 2x / week    PT Duration 6 weeks    PT Treatment/Interventions ADLs/Self Care Home Management;Patient/family education;Balance training;Neuromuscular re-education;Gait training;Therapeutic activities;Therapeutic exercise;Functional mobility training;Taping;Manual techniques    PT Next Visit Plan Hold for 3 wks.    PT Home Exercise Plan Access Code: [email or web address]    Consulted and Agree with Plan of Care Patient             Patient will benefit from skilled therapeutic intervention in order to improve the following deficits and impairments:  Pain, Decreased activity tolerance, Decreased balance, Decreased strength, Decreased range of motion, Impaired flexibility, Impaired  tone, Increased fascial restricitons  Visit Diagnosis: Muscle weakness (generalized)  Other abnormalities of gait and mobility  Unsteadiness on feet  Other symptoms and signs involving the nervous system     Problem List Patient Active Problem List   Diagnosis Date Noted   Nasal congestion 04/11/2021   C. 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[place]) 02/09/2021   Visual hallucinations 11/30/2020   Cervical myelopathy ([place]) 11/24/2020   Cystoid macular edema of both eyes 07/28/2020   Status post cervical spinal fusion 07/09/2020   History of total bilateral knee replacement 07/08/2020   Aortic atherosclerosis ([place]) 03/05/2020   Emphysema, unspecified ([place]) 03/05/2020   Heme positive stool 02/29/2020   Leg cramps 11/13/2019   Tibialis anterior tendon tear, nontraumatic 11/13/2019   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   CPAP (continuous positive airway pressure) dependence 03/25/2018   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   Acute idiopathic gout of hand 03/25/2018   Erectile dysfunction 06/20/2017   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 03/06/2017   Peripheral neuropathy 06/19/2016   Hearing loss 02/02/2016   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   Allergic rhinitis 02/02/2016   Atypical chest pain 11/24/2015   Preventative health care 11/24/2015   Onychomycosis 06/30/2015   Degenerative disc disease, lumbar 06/30/2015   Other allergic rhinitis 02/18/2015   Deviated nasal septum 02/18/2015   OSA on CPAP 02/18/2015   RLS (restless legs syndrome) 02/18/2015   GERD (gastroesophageal reflux disease) 09/18/2014   Hyperglycemia 03/03/2014   Sleep apnea with use of  continuous positive airway pressure (CPAP) 07/15/2013   Carotid stenosis 02/24/2013   Nonspecific abnormal electrocardiogram (ECG) (EKG) 01/06/2013   DJD (degenerative joint disease) of hip 12/24/2012   Lower GI bleed 12/12/2012   Dyspnea 06/12/2011   Hypothyroidism 05/13/2009   HYPERCHOLESTEROLEMIA 05/13/2009   Hyperlipidemia 05/13/2009   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   Essential hypertension 05/13/2009   INGUINAL HERNIA 05/13/2009   HIATAL HERNIA 05/13/2009   NEPHROLITHIASIS 05/13/2009   ROSACEA 05/13/2009   Kerin Perna, PTA 05/10/21 12:49 PM   [place] [address] [place], [place], [address] Phone: [phone]   Fax:  [phone]  Name: [name] Pratt MRN: [id number] Date of Birth: [birth year]

## 2021-05-17 DIAGNOSIS — M461 Sacroiliitis, not elsewhere classified: Secondary | ICD-10-CM | POA: Diagnosis not present

## 2021-05-20 ENCOUNTER — Encounter (INDEPENDENT_AMBULATORY_CARE_PROVIDER_SITE_OTHER): Payer: Self-pay

## 2021-05-20 DIAGNOSIS — Z961 Presence of intraocular lens: Secondary | ICD-10-CM | POA: Diagnosis not present

## 2021-05-20 DIAGNOSIS — H18413 Arcus senilis, bilateral: Secondary | ICD-10-CM | POA: Diagnosis not present

## 2021-05-20 DIAGNOSIS — E119 Type 2 diabetes mellitus without complications: Secondary | ICD-10-CM | POA: Diagnosis not present

## 2021-05-20 DIAGNOSIS — H04123 Dry eye syndrome of bilateral lacrimal glands: Secondary | ICD-10-CM | POA: Diagnosis not present

## 2021-05-20 DIAGNOSIS — H35343 Macular cyst, hole, or pseudohole, bilateral: Secondary | ICD-10-CM | POA: Diagnosis not present

## 2021-05-24 DIAGNOSIS — G4733 Obstructive sleep apnea (adult) (pediatric): Secondary | ICD-10-CM | POA: Diagnosis not present

## 2021-05-26 ENCOUNTER — Other Ambulatory Visit: Payer: Self-pay

## 2021-05-26 ENCOUNTER — Ambulatory Visit (INDEPENDENT_AMBULATORY_CARE_PROVIDER_SITE_OTHER): Payer: Medicare HMO | Admitting: Ophthalmology

## 2021-05-26 ENCOUNTER — Encounter (INDEPENDENT_AMBULATORY_CARE_PROVIDER_SITE_OTHER): Payer: Medicare HMO | Admitting: Ophthalmology

## 2021-05-26 ENCOUNTER — Encounter (INDEPENDENT_AMBULATORY_CARE_PROVIDER_SITE_OTHER): Payer: Self-pay | Admitting: Ophthalmology

## 2021-05-26 DIAGNOSIS — H353211 Exudative age-related macular degeneration, right eye, with active choroidal neovascularization: Secondary | ICD-10-CM | POA: Diagnosis not present

## 2021-05-26 DIAGNOSIS — Z9989 Dependence on other enabling machines and devices: Secondary | ICD-10-CM

## 2021-05-26 DIAGNOSIS — H35073 Retinal telangiectasis, bilateral: Secondary | ICD-10-CM | POA: Diagnosis not present

## 2021-05-26 DIAGNOSIS — G4733 Obstructive sleep apnea (adult) (pediatric): Secondary | ICD-10-CM

## 2021-05-26 NOTE — Progress Notes (Signed)
05/26/2021     CHIEF COMPLAINT Patient presents for  Chief Complaint  Patient presents with   Retina Follow Up      HISTORY OF PRESENT ILLNESS: [name] Pratt is a 80 y.o. male who presents to the clinic today for:   HPI     Retina Follow Up   Patient presents with  Wet AMD.  In right eye.  This started 4 weeks ago.  Duration of 4 weeks.        Comments   4 week f/u OU with OCT  Pt states he continues to do well with the new better fitting CPAP mask. Pt states his vision actually seems to be improved some since previous visit. Pt denies any new floaters or flashes. Pt denies any eye pain today.  Eye Meds: Restasis Eye Vitamins      Last edited by Reather Littler, COA on 05/26/2021  9:46 AM.      Referring physician: [name], NP [address] [place],  [place] [address]  HISTORICAL INFORMATION:   Selected notes from the MEDICAL RECORD NUMBER    Lab Results  Component Value Date   HGBA1C 5.7 12/29/2020     CURRENT MEDICATIONS: Current Outpatient Medications (Ophthalmic Drugs)  Medication Sig   Artificial Tear Solution (SOOTHE XP) SOLN Place 1 drop into both eyes 2 (two) times daily as needed (Dry eyes).   cycloSPORINE (RESTASIS) 0.05 % ophthalmic emulsion Place 1 drop into both eyes 2 (two) times daily.   No current facility-administered medications for this visit. (Ophthalmic Drugs)   Current Outpatient Medications (Other)  Medication Sig   acetaminophen (TYLENOL) 650 MG CR tablet Take 650-1,300 mg by mouth See admin instructions. Take 1300 mg in the morning and 650 g at bedtime   albuterol (VENTOLIN HFA) 108 (90 Base) MCG/ACT inhaler Inhale 2 puffs into the lungs every 6 (six) hours as needed for shortness of breath.   Alcohol Swabs (ALCOHOL PADS) 70 % PADS 1 packet by Does not apply route 2 (two) times daily.   allopurinol (ZYLOPRIM) 100 MG tablet TAKE 1 TABLET TWICE DAILY   amLODipine (NORVASC) 5 MG tablet TAKE 1 TABLET  EVERY DAY   aspirin EC 81 MG tablet Take 81 mg by mouth every morning.   blood glucose meter kit and supplies Dispense based on patient and insurance preference. Use up to four times daily as directed. (FOR ICD-10 E10.9, E11.9).   Cholecalciferol (VITAMIN D) 50 MCG (2000 UT) CAPS Take 2,000 Units by mouth daily.   cholestyramine (QUESTRAN) 4 g packet Take 1 packet (4 g total) by mouth 2 (two) times daily as needed.   colchicine 0.6 MG tablet Take 1 tablet by mouth daily as needed (Gout).   Cranberry 500 MG TABS Take 500 mg by mouth daily.    diclofenac Sodium (VOLTAREN) 1 % GEL Apply 2 g topically daily as needed (Hand pain).   gabapentin (NEURONTIN) 300 MG capsule Take 300 mg by mouth 2 (two) times daily as needed.   glucose blood test strip TRUE Metrix Blood Glucose Test Strip, use as instructed   Homeopathic Products (THERAWORX RELIEF) FOAM Apply 1 application topically 2 (two) times daily as needed (Pain).   hydrALAZINE (APRESOLINE) 50 MG tablet TAKE 1 TABLET THREE TIMES DAILY   Lancets MISC 1 each by Does not apply route 2 (two) times daily. TRUE matrix lancets   levothyroxine (SYNTHROID) 75 MCG tablet Take 2 tabs by mouth once daily 6  days a week and 1 tab by mouth once daily one day a week   losartan (COZAAR) 100 MG tablet Take 1 tablet (100 mg total) by mouth daily.   Magnesium 250 MG TABS Take by mouth.   Menthol, Topical Analgesic, (ZIMS MAX-FREEZE EX) Apply 1 application topically daily as needed (pain).   multivitamin (THERAGRAN) per tablet Take 1 tablet by mouth daily.   nitroGLYCERIN (NITROSTAT) 0.4 MG SL tablet Place 1 tablet (0.4 mg total) under the tongue every 5 (five) minutes as needed for chest pain.   Omega-3 Fatty Acids (FISH OIL) 1200 MG CAPS Take 1,200 mg by mouth daily.   rosuvastatin (CRESTOR) 5 MG tablet TAKE 1 TABLET EVERY DAY   sodium chloride (OCEAN) 0.65 % nasal spray Place 1 spray into both nostrils at bedtime as needed for congestion.   No current  facility-administered medications for this visit. (Other)      REVIEW OF SYSTEMS:    ALLERGIES Allergies  Allergen Reactions   Pravastatin Other (See Comments)    Muscle cramps   Sildenafil Other (See Comments)    Tachycardia     Metoclopramide    Oxycodone     hallucinations   Atenolol Other (See Comments)    bradycardia   Elastic Bandages & [Zinc] Other (See Comments)    Teflon bandages - Irritation   Metoclopramide Hcl Anxiety    PAST MEDICAL HISTORY Past Medical History:  Diagnosis Date   Aortic atherosclerosis ([place]) 03/05/2020   Arthritis    Cancer ([place])    skin cancer   Carotid stenosis    a. Carotid U/S 1/61: LICA < 09%, RICA 60-45%;  b.  Carotid U/S 4/09:  RICA 81-19%; LICA 1-47%; f/u 1 year   Chest pain     Myoview in 2008 was normal.  Echo 7/09: EF 60%, normal wall motion, mild LVH, mild LAE.   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   Past Surgical History:  Procedure Laterality Date   ABDOMINAL HERNIA REPAIR  07/15/12   Dr [name]   ANTERIOR CERVICAL DECOMP/DISCECTOMY FUSION N/A 07/09/2020   Procedure: ANTERIOR CERVICAL DECOMPRESSION AND FUSION CERVICAL THREE-FOUR.;  Surgeon: [name], MD;  Location: [facility];  Service: Neurosurgery;  Laterality: N/A;  anterior   BROW LIFT  05/07/01   CYSTOSCOPY WITH URETEROSCOPY AND STENT PLACEMENT Right  01/28/2014   Procedure: CYSTOSCOPY WITH URETEROSCOPY, BASKET RETRIVAL AND  STENT PLACEMENT;  Surgeon: [name], MD;  Location: [facility];  Service: Urology;  Laterality: Right;   epidural injections     multiple procedures   ESOPHAGEAL DILATION  1993 and 1994   multiple times   EYE SURGERY Bilateral 03/25/10, 2012   cataract removal   HEMORRHOID SURGERY  2014  HIATAL HERNIA REPAIR  12/21/93   HOLMIUM LASER APPLICATION Right 6/64/4034   Procedure: HOLMIUM LASER APPLICATION;  Surgeon: [name], MD;  Location: [facility];  Service: Urology;  Laterality: Right;   INGUINAL HERNIA REPAIR Right 07/15/12   Dr [name], x2   JOINT REPLACEMENT  07/25/04   right knee   JOINT REPLACEMENT  07/13/10   left knee   [place]  09/20/06   with intraoperative cholangiogram and right inguinal herniorrhaphy with mesh    LIGAMENT REPAIR Left 07/2013   shoulder   LITHOTRIPSY     x2   [place] FUSION/FORAMINOTOMY N/A 11/24/2020   Procedure: Posterior Cervical Laminectomy Cervical three-four, Cervical four-five with lateral mass fusion;  Surgeon: [name], MD;  Location: [facility];  Service: Neurosurgery;  Laterality: N/A;   REFRACTIVE SURGERY Bilateral    Right total hip replacement  4/14   SKIN CANCER DESTRUCTION     nose, ear   TONSILLECTOMY  as child   TOTAL HIP ARTHROPLASTY Left    URETHRAL DILATION  1991   [name]    FAMILY HISTORY Family History  Problem Relation Age of Onset   Cancer Mother    Hypertension Other    Cancer Other    Osteoarthritis Other     SOCIAL HISTORY Social History   Tobacco Use   Smoking status: Former    Years: 11.00    Types: Cigarettes    Quit date: 09/11/1978    Years since quitting: 42.7   Smokeless tobacco: Never  Vaping Use   Vaping Use: Never used  Substance Use Topics   Alcohol use: Not Currently    Alcohol/week: 0.0 standard drinks    Comment: rare   Drug use: No          OPHTHALMIC EXAM:  Base Eye Exam     Visual Acuity (ETDRS)       Right Left   Dist [place] 20/80 +1 20/30 -2   Dist ph  20/60 -2 NI         Tonometry (Tonopen, 9:55 AM)       Right Left   Pressure 12 12         Pupils       Pupils Dark Light Shape React APD   Right PERRL 1.5 1 Round Minimal None   Left PERRL 1.5 1 Round Minimal None         Visual Fields (Counting fingers)       Left Right    Full Full         Extraocular Movement       Right Left    Full, Ortho Full, Ortho         Neuro/Psych     Oriented x3: Yes   Mood/Affect: Normal         Dilation     Both eyes: 1.0% Mydriacyl, 2.5% Phenylephrine @ 9:54 AM           Slit Lamp and Fundus Exam     External Exam       Right Left   External Normal Normal         Slit Lamp Exam       Right Left   Lids/Lashes Normal Normal   Conjunctiva/Sclera White and quiet White and quiet   Cornea Clear Clear   Anterior Chamber Deep and quiet Deep and quiet   [place]ris PI PI  Lens Clear Clear   Anterior Vitreous Normal Normal         Fundus Exam       Right Left   Posterior Vitreous Posterior vitreous detachment Posterior vitreous detachment   Disc Normal Normal   C/D Ratio 0.5 0.5   Macula No overt CME, angulated vessels temporally, with thickening along the p.m. bundle. No overt CME, angulated vessels temporally   Vessels Normal Normal   Periphery Normal Normal            IMAGING AND PROCEDURES  Imaging and Procedures for 05/26/21  OCT, Retina - OU - Both Eyes       Right Eye Quality was good. Scan locations included subfoveal. Central Foveal Thickness: 239. Progression has improved. Findings include abnormal foveal contour, cystoid macular edema.   Left Eye Quality was good. Scan locations included subfoveal. Central Foveal Thickness: 220. Progression has been stable. Findings include abnormal foveal contour, cystoid macular edema.   Notes Chronic active CME OD  particularly nasal and region of what apparently was a previous CNVM but which responded to treatment and continues to resolve even after no therapy 1 month previous.               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 Continued observation today and stabilization of MAC-TEL and peripapillary CME likely due to recent improvement in enhancements and CPAP mask and compliance thereof using nightly CPAP and and preventing nightly macular hypoxia from incomplete treatment of sleep apnea  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HCC) No signs of active CNVM today, 1 month from last examination where no treatment was rendered in 9 weeks since last treatment was rendered in the para macular and peripapillary "CNVM" has continued to resolve thus for this was most likely a region of MAC-TEL made worse by nighttime macular hypoxia from inadequately treated sleep apnea onset June 2022 which improved thereafter with improved mask adherence     ICD-10-CM   1. 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HCC)  H35.3211 OCT, Retina - OU - Both Eyes    2. 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  No signs of active CNVM OD and in fact improving macular findings with excellent compliance of CPAP with new fitting mask  2.  2 MAC-TEL OU chronically active yet not progressive the left eye has reached the atrophic stage Temporally with no active CME collected on OCT, despite angiographic leakage on fluorescein angiography 04-28-2021  3.  Ophthalmic Meds Ordered this visit:  No orders of the defined types were placed in this encounter.      Return in about 8 weeks (around 07/21/2021) for DILATE OU, OCT.  There are no Patient Instructions on file for this visit.   Explained the diagnoses, plan, and follow up with the patient and they expressed  understanding.  Patient expressed understanding of the importance of proper follow up care.   Clent Demark  M.D. Diseases & Surgery of the Retina and Vitreous Retina & Diabetic [place] 05/26/21     Abbreviations: M myopia (nearsighted); A astigmatism; H hyperopia (farsighted); P presbyopia; Mrx spectacle prescription;  CTL contact lenses; OD right eye; OS left eye; OU both eyes  XT exotropia; ET esotropia; PEK punctate epithelial keratitis; PEE punctate epithelial erosions; DES dry eye syndrome; MGD meibomian gland dysfunction; ATs artificial tears; PFAT's preservative free artificial tears;  [place] nuclear sclerotic cataract; PSC posterior subcapsular cataract; ERM epi-retinal membrane; PVD posterior vitreous detachment; RD retinal detachment; DM diabetes mellitus; DR diabetic retinopathy; NPDR non-proliferative diabetic retinopathy; PDR proliferative diabetic retinopathy; CSME clinically significant macular edema; DME diabetic macular edema; dbh dot blot hemorrhages; CWS cotton wool spot; POAG primary open angle glaucoma; C/D cup-to-disc ratio; HVF humphrey visual field; GVF goldmann visual field; OCT optical coherence tomography; IOP intraocular pressure; BRVO Branch retinal vein occlusion; CRVO central retinal vein occlusion; CRAO central retinal artery occlusion; BRAO branch retinal artery occlusion; RT retinal tear; SB scleral buckle; PPV pars plana vitrectomy; VH Vitreous hemorrhage; PRP panretinal laser photocoagulation; IVK intravitreal kenalog; VMT vitreomacular traction; MH Macular hole;  NVD neovascularization of the disc; NVE neovascularization elsewhere; AREDS age related eye disease study; ARMD age related macular degeneration; POAG primary open angle glaucoma; EBMD epithelial/anterior basement membrane dystrophy; ACIOL anterior chamber intraocular lens; IOL intraocular lens; PCIOL posterior chamber intraocular lens; Phaco/IOL phacoemulsification with intraocular lens placement; [place]  photorefractive keratectomy; LASIK laser assisted in situ keratomileusis; HTN hypertension; DM diabetes mellitus; COPD chronic obstructive pulmonary disease

## 2021-05-26 NOTE — Assessment & Plan Note (Addendum)
No signs of active CNVM today, 1 month from last examination where no treatment was rendered in 9 weeks since last treatment was rendered in the para macular and peripapillary "CNVM" has continued to resolve thus for this was most likely a region of MAC-TEL made worse by nighttime macular hypoxia from inadequately treated sleep apnea onset June 2022 which improved thereafter with improved mask adherence

## 2021-05-26 NOTE — Assessment & Plan Note (Signed)
At the end of the ophthalmic encounter today patient reported nightly did mask work better and fit better now since June 2022, he now sleeps throughout the night and feels better the next day.  This is clinical suggestive evidence that he has had improved nighttime oxygenation during the recent months which would be coincident with less active MAC-TEL, over the last 2 to 3 months

## 2021-05-26 NOTE — Assessment & Plan Note (Signed)
Continued observation today and stabilization of MAC-TEL and peripapillary CME likely due to recent improvement in enhancements and CPAP mask and compliance thereof using nightly CPAP and and preventing nightly macular hypoxia from incomplete treatment of sleep apnea

## 2021-05-31 ENCOUNTER — Other Ambulatory Visit: Payer: Self-pay

## 2021-05-31 ENCOUNTER — Ambulatory Visit: Payer: Medicare HMO | Admitting: Physical Therapy

## 2021-05-31 DIAGNOSIS — R29818 Other symptoms and signs involving the nervous system: Secondary | ICD-10-CM

## 2021-05-31 DIAGNOSIS — R2689 Other abnormalities of gait and mobility: Secondary | ICD-10-CM

## 2021-05-31 DIAGNOSIS — M6281 Muscle weakness (generalized): Secondary | ICD-10-CM | POA: Diagnosis not present

## 2021-05-31 DIAGNOSIS — R2681 Unsteadiness on feet: Secondary | ICD-10-CM

## 2021-05-31 NOTE — Patient Instructions (Signed)
Access Code: [email or web address] URL: [email or web address] Date: 05/31/2021 Prepared by: Isabelle Course  Exercises Sit to Stand with Armchair - 2 x daily - 7 x weekly - 1 sets - 10 reps Heel Toe Raises with Counter Support - 2 x daily - 7 x weekly - 1 sets - 10 reps Side Stepping with Counter Support - 2 x daily - 7 x weekly - 1 sets - 10 reps Standing Marching - 2 x daily - 7 x weekly - 1 sets - 10 reps Seated Hamstring Stretch - 2 x daily - 7 x weekly - 1 sets - 3 reps - 30 seconds hold Single Leg Balance with Clock Reach - 1 x daily - 7 x weekly - 1 sets - 5 reps Standing Tandem Balance with Counter Support - 1 x daily - 7 x weekly - 3 sets - 1 reps - 30 seconds hold
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
## 2021-06-07 ENCOUNTER — Other Ambulatory Visit: Payer: Self-pay

## 2021-06-07 ENCOUNTER — Ambulatory Visit: Payer: Medicare HMO | Admitting: Physical Therapy

## 2021-06-07 ENCOUNTER — Encounter: Payer: Self-pay | Admitting: Physical Therapy

## 2021-06-07 DIAGNOSIS — M6281 Muscle weakness (generalized): Secondary | ICD-10-CM | POA: Diagnosis not present

## 2021-06-07 DIAGNOSIS — R2689 Other abnormalities of gait and mobility: Secondary | ICD-10-CM | POA: Diagnosis not present

## 2021-06-07 DIAGNOSIS — R2681 Unsteadiness on feet: Secondary | ICD-10-CM

## 2021-06-07 NOTE — Therapy (Signed)
[place], [place], [address] Phone: [phone]   Fax:  [phone]  Physical Therapy Treatment  Patient Details  Name: [name] Pratt MRN: [id number] Date of Birth: February 15, 1941 Referring Provider (PT): [name], NP   Encounter Date: 06/07/2021   PT End of Session - 06/07/21 1156     Visit Number 13    Number of Visits 18    Date for PT Re-Evaluation 07/12/21    Authorization Type [name]    Authorization - Visit Number 13    Progress Note Due on Visit 20    PT Start Time 9323    Equipment Utilized During Treatment Gait belt    Activity Tolerance Patient tolerated treatment well    Behavior During Therapy [facility] for tasks assessed/performed             Past Medical History:  Diagnosis Date   Aortic atherosclerosis ([place]) 03/05/2020   Arthritis    Cancer ([place])    skin cancer   Carotid stenosis    a. Carotid U/S 5/57: LICA < 32%, RICA 20-25%;  b.  Carotid U/S 4/27:  RICA 06-23%; LICA 7-62%; f/u 1 year   Chest pain     Myoview in 2008 was normal.  Echo 7/09: EF 60%, normal wall motion, mild LVH, mild LAE.   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    Past Surgical History:  Procedure Laterality Date   ABDOMINAL HERNIA REPAIR  07/15/12   Dr [name]    ANTERIOR CERVICAL DECOMP/DISCECTOMY FUSION N/A 07/09/2020   Procedure: ANTERIOR CERVICAL DECOMPRESSION AND FUSION CERVICAL THREE-FOUR.;  Surgeon: [name], MD;  Location: [place];  Service: Neurosurgery;  Laterality: N/A;  anterior   BROW LIFT  05/07/01   CYSTOSCOPY WITH URETEROSCOPY AND STENT PLACEMENT Right 01/28/2014   Procedure: CYSTOSCOPY WITH URETEROSCOPY, BASKET RETRIVAL AND  STENT PLACEMENT;  Surgeon: [name], MD;  [facility];  Service: Urology;  Laterality: Right;   epidural injections     multiple procedures   ESOPHAGEAL DILATION  1993 and 1994   multiple times   EYE SURGERY Bilateral 03/25/10, 2012   cataract removal   HEMORRHOID SURGERY  2014   HIATAL HERNIA REPAIR  12/21/93   HOLMIUM LASER APPLICATION Right 05/12/5175   Procedure: HOLMIUM LASER APPLICATION;  Surgeon: [name], MD;  [facility];  Service: Urology;  Laterality: Right;   INGUINAL HERNIA REPAIR Right 07/15/12   Dr [name], x2   JOINT REPLACEMENT  07/25/04   right knee   JOINT REPLACEMENT  07/13/10   left knee   [place]  09/20/06   with intraoperative cholangiogram and right inguinal herniorrhaphy with  mesh    LIGAMENT REPAIR Left 07/2013   shoulder   LITHOTRIPSY     x2   NASAL SEPTUM SURGERY  1975   POSTERIOR CERVICAL FUSION/FORAMINOTOMY N/A 11/24/2020   Procedure: Posterior Cervical Laminectomy Cervical three-four, Cervical four-five with lateral mass fusion;  Surgeon: [name], MD;  Location: [place];  Service: Neurosurgery;  Laterality: N/A;   REFRACTIVE SURGERY Bilateral    Right total hip replacement  4/14   SKIN CANCER DESTRUCTION     nose, ear   TONSILLECTOMY  as child   TOTAL HIP ARTHROPLASTY Left    URETHRAL DILATION  1991   [name]    There were no vitals filed for this visit.   Subjective Assessment - 06/07/21 1148     Subjective Pt complains that his Lt hip is painful and weak now.  He reports he has trouble putting his Lt foot  up on the step due to increased pain.  He is to have injection tomorrow (lower back/hip?).  He had muscle cramps in LEs last night.    Pertinent History spondylitic myelopathy, ACDF, HTN, COPD, cervical decompression, sleep apnea    Patient Stated Goals "walking good and have good balance."    Currently in Pain? Yes    Pain Score 7     Pain Location Hip    Pain Orientation Left    Pain Descriptors / Indicators Sore;Sharp    Aggravating Factors  activity, sleeping at night    Pain Relieving Factors unsure.                [facility] PT Assessment - 06/07/21 0001       Assessment   Medical Diagnosis gait disorder    Referring Provider (PT) [name], NP    Onset Date/Surgical Date 11/14/20    Hand Dominance Right               [facility] Adult PT Treatment/Exercise - 06/07/21 0001       Knee/Hip Exercises: Stretches   Passive Hamstring Stretch Right;Left;3 reps;20 seconds   seated with hip hinge.   Quad Stretch Limitations trial in prone with therapist assist; unable to tolerate and cramped in hamstrings.    Gastroc Stretch Right;Left;2 reps;20 seconds   runners stretch     Knee/Hip Exercises: Aerobic   Nustep L4-5 x 5 mins for warm up      Knee/Hip Exercises: Standing   Hip Extension Stengthening;Right;Left;1 set;10 reps   UE support on counter, cues for posture   Forward Step Up Left;1 set;5 reps;Hand Hold: 2;Step Height: 4";Step Height: 6"    Forward Step Up Limitations requires heavy UE support on rail    Functional Squat 1 set;10 reps   mini squats at sink, cues for hip hinge.     Knee/Hip Exercises: Seated   Sit to Sand 1 set;5 reps;without UE support   eccentric lowering. 5 reps completed over the course of treatment.     Manual Therapy   Manual Therapy Soft tissue mobilization;Joint mobilization    Joint Mobilization gentle PA mobs to Lt hip, with hip ext    Soft tissue mobilization STM to Lt glute, hip rotators and hamstring to decrease tightness and  improve mobility.             Balance Exercises - 06/07/21 0001       Balance Exercises: Standing   Tandem Stance Eyes open;Intermittent upper extremity support;1 rep;15 secs   painful with LLE in back   Retro Gait  Upper extremity support;3 reps    Sidestepping Upper extremity support;3 reps   with increased step height   Other Standing Exercises SLS with toe taps forward, side, and backward x 5 reps each leg, with UE support on counter                     PT Long Term Goals - 05/31/21 1053       PT LONG TERM GOAL #1   Title The patient will be indep with HEP.    Time 6    Period Weeks    Status On-going    Target Date 07/12/21      PT LONG TERM GOAL #2   Title The patient will improve Berg balance scale from 36/56 to 40/56    Time 6    Period Weeks    Status New    Target Date 07/12/21      PT LONG TERM GOAL #3   Title The patient will improve gait speed from 2.5 ft/sec to > or equal to 2.62 ft/sec to demo improved community moiblity.    Time 6    Period Weeks    Status New      PT LONG TERM GOAL #4   Title Pt will maintain TUG at 14.5 seconds to demo maintained mobility    Time 6    Period Weeks    Status New    Target Date 07/12/21                   Plan - 06/07/21 1212     Clinical Impression Statement Pt arrives with increased Lt hip pain, that increases with weight bearing (especially with ascending steps and retro gait).  Pt continues with decreased LE strength and standing balance without UE support. No change in pain in Lt hip with manual therapy.  Plan to progress LE exercises as able.    Personal Factors and Comorbidities Comorbidity 3+;Time since onset of injury/illness/exacerbation    Comorbidities HTN, COPD, spondylitis myelopathy, falls    Examination-Activity Limitations Squat;Stairs;Locomotion Level    Examination-Participation Restrictions Yard Work;Community Activity    Rehab Potential Good    PT Frequency 1x / week     PT Duration 6 weeks    PT Treatment/Interventions ADLs/Self Care Home Management;Patient/family education;Balance training;Neuromuscular re-education;Gait training;Therapeutic activities;Therapeutic exercise;Functional mobility training;Taping;Manual techniques    PT Next Visit Plan progress gait and balance    PT Home Exercise Plan Access Code: [email or web address]    Consulted and Agree with Plan of Care Patient             Patient will benefit from skilled therapeutic intervention in order to improve the following deficits and impairments:  Pain, Decreased activity tolerance, Decreased balance, Decreased strength, Decreased range of motion, Impaired flexibility, Impaired tone, Increased fascial restricitons  Visit Diagnosis: Muscle weakness (generalized)  Unsteadiness on feet  Other abnormalities of gait and mobility     Problem List Patient Active Problem List   Diagnosis Date Noted   Nasal congestion 04/11/2021   C. 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[place]) 02/09/2021   Visual hallucinations 11/30/2020   Cervical myelopathy ([place]) 11/24/2020   Cystoid macular edema of both eyes 07/28/2020   Status post cervical spinal fusion  07/09/2020   History of total bilateral knee replacement 07/08/2020   Aortic atherosclerosis ([place]) 03/05/2020   Emphysema, unspecified ([place]) 03/05/2020   Heme positive stool 02/29/2020   Leg cramps 11/13/2019   Tibialis anterior tendon tear, nontraumatic 11/13/2019   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   CPAP (continuous positive airway pressure) dependence 03/25/2018   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   Acute idiopathic gout of hand 03/25/2018   Erectile dysfunction  06/20/2017   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 03/06/2017   Peripheral neuropathy 06/19/2016   Hearing loss 02/02/2016   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   Allergic rhinitis 02/02/2016   Atypical chest pain 11/24/2015   Preventative health care 11/24/2015   Onychomycosis 06/30/2015   Degenerative disc disease, lumbar 06/30/2015   Other allergic rhinitis 02/18/2015   Deviated nasal septum 02/18/2015   OSA on CPAP 02/18/2015   RLS (restless legs syndrome) 02/18/2015   GERD (gastroesophageal reflux disease) 09/18/2014   Hyperglycemia 03/03/2014   Sleep apnea with use of continuous positive airway pressure (CPAP) 07/15/2013   Carotid stenosis 02/24/2013   Nonspecific abnormal electrocardiogram (ECG) (EKG) 01/06/2013   DJD (degenerative joint disease) of hip 12/24/2012   Lower GI bleed 12/12/2012   Dyspnea 06/12/2011   Hypothyroidism 05/13/2009   HYPERCHOLESTEROLEMIA 05/13/2009   Hyperlipidemia 05/13/2009   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   Essential hypertension 05/13/2009   INGUINAL HERNIA 05/13/2009   HIATAL HERNIA 05/13/2009   NEPHROLITHIASIS 05/13/2009   ROSACEA 05/13/2009   Kerin Perna, PTA 06/07/21 12:43 PM   [place] [address] [place], [place], [address] Phone: [phone]   Fax:  [phone]  Name: [name] Pratt MRN: [id number] Date of Birth: 03-15-1941

## 2021-06-08 DIAGNOSIS — M461 Sacroiliitis, not elsewhere classified: Secondary | ICD-10-CM | POA: Diagnosis not present

## 2021-06-13 DIAGNOSIS — Z129 Encounter for screening for malignant neoplasm, site unspecified: Secondary | ICD-10-CM | POA: Diagnosis not present

## 2021-06-13 DIAGNOSIS — L720 Epidermal cyst: Secondary | ICD-10-CM | POA: Diagnosis not present

## 2021-06-13 DIAGNOSIS — L570 Actinic keratosis: Secondary | ICD-10-CM | POA: Diagnosis not present

## 2021-06-13 DIAGNOSIS — Z85828 Personal history of other malignant neoplasm of skin: Secondary | ICD-10-CM | POA: Diagnosis not present

## 2021-06-13 DIAGNOSIS — L218 Other seborrheic dermatitis: Secondary | ICD-10-CM | POA: Diagnosis not present

## 2021-06-13 DIAGNOSIS — C44519 Basal cell carcinoma of skin of other part of trunk: Secondary | ICD-10-CM | POA: Diagnosis not present

## 2021-06-14 ENCOUNTER — Telehealth: Payer: Self-pay | Admitting: Family

## 2021-06-14 ENCOUNTER — Ambulatory Visit: Payer: Medicare HMO | Admitting: Physical Therapy

## 2021-06-14 ENCOUNTER — Emergency Department (HOSPITAL_BASED_OUTPATIENT_CLINIC_OR_DEPARTMENT_OTHER): Payer: Medicare HMO

## 2021-06-14 ENCOUNTER — Emergency Department (HOSPITAL_BASED_OUTPATIENT_CLINIC_OR_DEPARTMENT_OTHER)
Admission: EM | Admit: 2021-06-14 | Discharge: 2021-06-14 | Disposition: A | Discharge status: Home / Self Care | Payer: Medicare HMO | Attending: Emergency Medicine | Admitting: Emergency Medicine

## 2021-06-14 ENCOUNTER — Encounter (HOSPITAL_BASED_OUTPATIENT_CLINIC_OR_DEPARTMENT_OTHER): Payer: Self-pay

## 2021-06-14 ENCOUNTER — Encounter: Payer: Self-pay | Admitting: Physical Therapy

## 2021-06-14 ENCOUNTER — Other Ambulatory Visit: Payer: Self-pay

## 2021-06-14 DIAGNOSIS — Z85828 Personal history of other malignant neoplasm of skin: Secondary | ICD-10-CM | POA: Insufficient documentation

## 2021-06-14 DIAGNOSIS — Z87891 Personal history of nicotine dependence: Secondary | ICD-10-CM | POA: Diagnosis not present

## 2021-06-14 DIAGNOSIS — R2689 Other abnormalities of gait and mobility: Secondary | ICD-10-CM | POA: Diagnosis not present

## 2021-06-14 DIAGNOSIS — R110 Nausea: Secondary | ICD-10-CM | POA: Diagnosis not present

## 2021-06-14 DIAGNOSIS — Z79899 Other long term (current) drug therapy: Secondary | ICD-10-CM | POA: Insufficient documentation

## 2021-06-14 DIAGNOSIS — Z96653 Presence of artificial knee joint, bilateral: Secondary | ICD-10-CM | POA: Diagnosis not present

## 2021-06-14 DIAGNOSIS — R2681 Unsteadiness on feet: Secondary | ICD-10-CM | POA: Diagnosis not present

## 2021-06-14 DIAGNOSIS — E039 Hypothyroidism, unspecified: Secondary | ICD-10-CM | POA: Insufficient documentation

## 2021-06-14 DIAGNOSIS — M6281 Muscle weakness (generalized): Secondary | ICD-10-CM | POA: Diagnosis not present

## 2021-06-14 DIAGNOSIS — I2510 Atherosclerotic heart disease of native coronary artery without angina pectoris: Secondary | ICD-10-CM | POA: Diagnosis not present

## 2021-06-14 DIAGNOSIS — Z96642 Presence of left artificial hip joint: Secondary | ICD-10-CM | POA: Insufficient documentation

## 2021-06-14 DIAGNOSIS — R29818 Other symptoms and signs involving the nervous system: Secondary | ICD-10-CM | POA: Diagnosis not present

## 2021-06-14 DIAGNOSIS — J449 Chronic obstructive pulmonary disease, unspecified: Secondary | ICD-10-CM | POA: Insufficient documentation

## 2021-06-14 DIAGNOSIS — I10 Essential (primary) hypertension: Secondary | ICD-10-CM | POA: Insufficient documentation

## 2021-06-14 DIAGNOSIS — Z7982 Long term (current) use of aspirin: Secondary | ICD-10-CM | POA: Insufficient documentation

## 2021-06-14 DIAGNOSIS — R42 Dizziness and giddiness: Secondary | ICD-10-CM | POA: Insufficient documentation

## 2021-06-14 DIAGNOSIS — D649 Anemia, unspecified: Secondary | ICD-10-CM | POA: Diagnosis not present

## 2021-06-14 LAB — CBC
HCT: 38.4 % — ABNORMAL LOW (ref 39.0–52.0)
Hemoglobin: 12.8 g/dL — ABNORMAL LOW (ref 13.0–17.0)
MCH: 32.9 pg (ref 26.0–34.0)
MCHC: 33.3 g/dL (ref 30.0–36.0)
MCV: 98.7 fL (ref 80.0–100.0)
Platelets: 303 10*3/uL (ref 150–400)
RBC: 3.89 MIL/uL — ABNORMAL LOW (ref 4.22–5.81)
RDW: 13.7 % (ref 11.5–15.5)
WBC: 8.2 10*3/uL (ref 4.0–10.5)
nRBC: 0 % (ref 0.0–0.2)

## 2021-06-14 LAB — BASIC METABOLIC PANEL
Anion gap: 8 (ref 5–15)
BUN: 29 mg/dL — ABNORMAL HIGH (ref 8–23)
CO2: 25 mmol/L (ref 22–32)
Calcium: 8.8 mg/dL — ABNORMAL LOW (ref 8.9–10.3)
Chloride: 106 mmol/L (ref 98–111)
Creatinine, Ser: 1.21 mg/dL (ref 0.61–1.24)
GFR, Estimated: >60 mL/min (ref >60)
Glucose, Bld: 120 mg/dL — ABNORMAL HIGH (ref 70–99)
Potassium: 3.4 mmol/L — ABNORMAL LOW (ref 3.5–5.1)
Sodium: 139 mmol/L (ref 135–145)

## 2021-06-14 LAB — URINALYSIS, ROUTINE W REFLEX MICROSCOPIC
Bilirubin Urine: NEGATIVE
Glucose, UA: NEGATIVE mg/dL
Hgb urine dipstick: NEGATIVE
Ketones, ur: NEGATIVE mg/dL
Leukocytes,Ua: NEGATIVE
Nitrite: NEGATIVE
Protein, ur: 100 mg/dL — AB
Specific Gravity, Urine: 1.025 (ref 1.005–1.030)
pH: 6.5 (ref 5.0–8.0)

## 2021-06-14 LAB — URINALYSIS, MICROSCOPIC (REFLEX)

## 2021-06-14 LAB — TROPONIN I (HIGH SENSITIVITY)
Troponin I (High Sensitivity): 16 ng/L (ref <18)
Troponin I (High Sensitivity): 19 ng/L — ABNORMAL HIGH (ref <18)
Troponin I (High Sensitivity): 18 ng/L — ABNORMAL HIGH (ref <18)

## 2021-06-14 MED ORDER — MECLIZINE HCL 25 MG PO TABS
25.0000 mg | ORAL_TABLET | Freq: Three times a day (TID) | ORAL | 0 refills | Status: DC | PRN
  Filled 2021-06-15: qty 20, 7d supply, fill #0

## 2021-06-14 MED ORDER — SODIUM CHLORIDE 0.9 % IV BOLUS
500.0000 mL | Freq: Once | INTRAVENOUS | Status: AC
  Administered 2021-06-14: 500 mL via INTRAVENOUS

## 2021-06-14 MED ORDER — MECLIZINE HCL 25 MG PO TABS
12.5000 mg | ORAL_TABLET | Freq: Once | ORAL | Status: AC
  Administered 2021-06-14: 12.5 mg via ORAL
  Filled 2021-06-14: qty 1

## 2021-06-14 NOTE — Telephone Encounter (Signed)
Patient went physical therapy this morning 10.04 in [place] bp readings were 189/35. Transferred to Triage so patient could go over symptoms and receive advise.

## 2021-06-14 NOTE — ED Triage Notes (Signed)
Pt was at PT when he felt lightheaded. BP was 696 systolic. Continues to have hypertension, dizziness, nausea. Pt got a flu & pneumonia vaccine yesterday.

## 2021-06-14 NOTE — Telephone Encounter (Signed)
Who Is Calling Patient / Member / Family / Caregiver Call Type Triage / Clinical Relationship To Patient Self Return Phone Number [phone] (Primary) Chief Complaint Blood Pressure High Reason for Call Symptomatic / Request for Health Information Initial Comment Caller says that he had therapy this AM and did bicycle and other exercises. They took his BP and it was 185/135 sitting down, and 169/82 laying down. It was 191/95 twenty min ago. He feels fine Translation No Nurse Assessment Nurse: [place], RN, Estill Bamberg Date/Time (Eastern Time): 06/14/2021 12:40:33 PM Confirm and document reason for call. If symptomatic, describe symptoms. ---caller states his blood pressure was high at PT this morning. 189/135 sitting. he was starting to feel dizzy. it was 162/89 laying down. a few mins ago was 171/84 been having cramps in legs so he drank some pickle juice.

## 2021-06-14 NOTE — Telephone Encounter (Signed)
Look like patient went to ER

## 2021-06-14 NOTE — Therapy (Signed)
[place], [place], [address] Phone: [phone]   Fax:  [phone]  Physical Therapy Treatment  Patient Details  Name: [name] Pratt MRN: [id number] Date of Birth: 1941/06/25 Referring Provider (PT): [name], NP   Encounter Date: 06/14/2021   PT End of Session - 06/14/21 1103     Visit Number 14    Number of Visits 18    Date for PT Re-Evaluation 07/12/21    Authorization Type [name]    Authorization - Visit Number 14    Progress Note Due on Visit 20    PT Start Time 1104    PT Stop Time 1135    PT Time Calculation (min) 31 min    Equipment Utilized During Treatment Gait belt    Activity Tolerance Treatment limited secondary to medical complications (Comment)   elevated BP   Behavior During Therapy [facility] for tasks assessed/performed             Past Medical History:  Diagnosis Date   Aortic atherosclerosis ([place]) 03/05/2020   Arthritis    Cancer ([place])    skin cancer   Carotid stenosis    a. Carotid U/S 0/63: LICA < 01%, RICA 60-10%;  b.  Carotid U/S 9/32:  RICA 35-57%; LICA 3-22%; f/u 1 year   Chest pain     Myoview in 2008 was normal.  Echo 7/09: EF 60%, normal wall motion, mild LVH, mild LAE.   Complication of anesthesia    "stomach does not wake up"   COPD (chronic obstructive pulmonary disease) (HCC)    Emphysema, unspecified ([place]) 03/05/2020   GERD (gastroesophageal reflux disease)    Gout    History of chicken pox    History of hemorrhoids    History of kidney stones    Hyperlipidemia    under control   Hypertension    under control   Hypothyroidism    Ileus ([place])    Inguinal hernia    OSA (obstructive sleep apnea)    Pancreatitis    PONV (postoperative nausea and vomiting)    Rosacea    SIRS (systemic inflammatory response syndrome) (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     Past Surgical History:  Procedure Laterality Date   ABDOMINAL HERNIA REPAIR  07/15/12   Dr [name]   ANTERIOR CERVICAL DECOMP/DISCECTOMY FUSION N/A 07/09/2020   Procedure: ANTERIOR CERVICAL DECOMPRESSION AND FUSION CERVICAL THREE-FOUR.;  Surgeon: [name], MD;  Location: [facility];  Service: Neurosurgery;  Laterality: N/A;  anterior   BROW LIFT  05/07/01   CYSTOSCOPY WITH URETEROSCOPY AND STENT PLACEMENT Right 01/28/2014   Procedure: CYSTOSCOPY WITH URETEROSCOPY, BASKET RETRIVAL AND  STENT PLACEMENT;  Surgeon: [name], MD;  Location: [facility];  Service: Urology;  Laterality: Right;   epidural injections     multiple procedures   ESOPHAGEAL DILATION  1993 and 1994   multiple times   EYE SURGERY Bilateral 03/25/10, 2012   cataract removal   HEMORRHOID SURGERY  2014   HIATAL HERNIA REPAIR  12/21/93   HOLMIUM LASER APPLICATION Right 0/25/4270   Procedure: HOLMIUM LASER APPLICATION;  Surgeon: [name], MD;  Location: [facility];  Service: Urology;  Laterality: Right;   INGUINAL HERNIA REPAIR Right 07/15/12   Dr [name], x2   JOINT REPLACEMENT  07/25/04   right knee   JOINT REPLACEMENT  07/13/10   left knee  [place]   LAPAROSCOPIC CHOLECYSTECTOMY  09/20/06   with intraoperative cholangiogram and right inguinal herniorrhaphy with mesh    LIGAMENT REPAIR Left 07/2013   shoulder   LITHOTRIPSY     x2   [place] FUSION/FORAMINOTOMY N/A 11/24/2020   Procedure: Posterior Cervical Laminectomy Cervical three-four, Cervical four-five with lateral mass fusion;  Surgeon: [name], MD;  Location: [facility];  Service: Neurosurgery;  Laterality: N/A;   REFRACTIVE SURGERY Bilateral    Right total hip replacement  4/14   SKIN CANCER DESTRUCTION     nose, ear   TONSILLECTOMY  as child   TOTAL HIP ARTHROPLASTY Left    URETHRAL DILATION  1991   [name]    There were no vitals filed for this visit.   Subjective Assessment - 06/14/21 1109      Subjective Pt reports that he had injections in both hips last week.  He states that his pain got a little worse, but yesterday and today he has been pain free.  He slept well the last 2 nights.  He arrives without his cane, "I wanted to walk without it".  He felt like he walked better without cane yesterday.    Pertinent History spondylitic myelopathy, ACDF, HTN, COPD, cervical decompression, sleep apnea    Patient Stated Goals "walking good and have good balance."    Currently in Pain? No/denies    Pain Score 0-No pain                [facility] PT Assessment - 06/14/21 0001       Assessment   Medical Diagnosis gait disorder    Referring Provider (PT) [name], NP    Onset Date/Surgical Date 11/14/20    Hand Dominance Right      Strength   Right Hip Flexion 4-/5    Right Hip ABduction 3+/5      Berg Balance Test   Standing Unsupported, Alternately Place Feet on Step/Stool Able to complete 4 steps without aid or supervision    Standing on One Leg Able to lift leg independently and hold equal to or more than 3 seconds              [facility] Adult PT Treatment/Exercise - 06/14/21 0001       Knee/Hip Exercises: Stretches   Gastroc Stretch Right;Left;2 reps;20 seconds   runners stretch     Knee/Hip Exercises: Aerobic   Recumbent Bike L2: 5 min for warm up      Knee/Hip Exercises: Standing   Functional Squat 1 set;10 reps   1/2 depth with cues for hip hinge, holding chair.   SLS each leg x 1 rep of 3-4 seconds. -without UE support    SLS with Vectors SLS with opp leg toe taps front/side/back with light support on chair x 5 reps each side.    Other Standing Knee Exercises toe taps to 6" step without UE support x 8.      Knee/Hip Exercises: Supine   Bridges 1 set;10 reps    Bridges Limitations cramped in Rt hamstring with 1st rep, but was able stretch and complete whole set.    Straight Leg Raises --   3 reps RLE     Knee/Hip Exercises: Sidelying   Hip ABduction --    3 reps RLE  PT Long Term Goals - 05/31/21 1053       PT LONG TERM GOAL #1   Title The patient will be indep with HEP.    Time 6    Period Weeks    Status On-going    Target Date 07/12/21      PT LONG TERM GOAL #2   Title The patient will improve Berg balance scale from 36/56 to 40/56    Time 6    Period Weeks    Status New    Target Date 07/12/21      PT LONG TERM GOAL #3   Title The patient will improve gait speed from 2.5 ft/sec to > or equal to 2.62 ft/sec to demo improved community moiblity.    Time 6    Period Weeks    Status New      PT LONG TERM GOAL #4   Title Pt will maintain TUG at 14.5 seconds to demo maintained mobility    Time 6    Period Weeks    Status New    Target Date 07/12/21                   Plan - 06/14/21 1155     Clinical Impression Statement Pt demonstrated improved balance and strength compared to previous sessions. During standing exercises pt reported increased dizziness.  Took seated BP in RUE: 185/135.  Repeated in supine after 5 min: 169/82.  Session ended and recommended pt contact his MD regarding elevated BP readings with symptoms. Also recommended he mention to MD that he has been drinking pickle juice 2x/day, which may effect BP due to high sodium content.    Personal Factors and Comorbidities Comorbidity 3+;Time since onset of injury/illness/exacerbation    Comorbidities HTN, COPD, spondylitis myelopathy, falls    Examination-Activity Limitations Squat;Stairs;Locomotion Level    Examination-Participation Restrictions Yard Work;Community Activity    Rehab Potential Good    PT Frequency 1x / week    PT Duration 6 weeks    PT Treatment/Interventions ADLs/Self Care Home Management;Patient/family education;Balance training;Neuromuscular re-education;Gait training;Therapeutic activities;Therapeutic exercise;Functional mobility training;Taping;Manual techniques    PT Next Visit Plan progress  gait and balance    PT Home Exercise Plan Access Code: [email or web address]    Consulted and Agree with Plan of Care Patient             Patient will benefit from skilled therapeutic intervention in order to improve the following deficits and impairments:  Pain, Decreased activity tolerance, Decreased balance, Decreased strength, Decreased range of motion, Impaired flexibility, Impaired tone, Increased fascial restricitons  Visit Diagnosis: Muscle weakness (generalized)  Unsteadiness on feet  Other abnormalities of gait and mobility  Other symptoms and signs involving the nervous system     Problem List Patient Active Problem List   Diagnosis Date Noted   Nasal congestion 04/11/2021   C. 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[place]) 02/09/2021   Visual hallucinations 11/30/2020   Cervical myelopathy ([place]) 11/24/2020   Cystoid macular edema of both eyes 07/28/2020   Status post cervical spinal fusion 07/09/2020   History of total bilateral knee replacement 07/08/2020   Aortic atherosclerosis ([place]) 03/05/2020   Emphysema, unspecified ([place]) 03/05/2020   Heme positive stool 02/29/2020   Leg cramps  11/13/2019   Tibialis anterior tendon tear, nontraumatic 11/13/2019   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   CPAP (continuous positive airway pressure) dependence 03/25/2018   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   Acute idiopathic gout of hand 03/25/2018   Erectile dysfunction 06/20/2017   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 03/06/2017   Peripheral neuropathy 06/19/2016   Hearing loss 02/02/2016   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   Allergic rhinitis 02/02/2016   Atypical chest  pain 11/24/2015   Preventative health care 11/24/2015   Onychomycosis 06/30/2015   Degenerative disc disease, lumbar 06/30/2015   Other allergic rhinitis 02/18/2015   Deviated nasal septum 02/18/2015   OSA on CPAP 02/18/2015   RLS (restless legs syndrome) 02/18/2015   GERD (gastroesophageal reflux disease) 09/18/2014   Hyperglycemia 03/03/2014   Sleep apnea with use of continuous positive airway pressure (CPAP) 07/15/2013   Carotid stenosis 02/24/2013   Nonspecific abnormal electrocardiogram (ECG) (EKG) 01/06/2013   DJD (degenerative joint disease) of hip 12/24/2012   Lower GI bleed 12/12/2012   Dyspnea 06/12/2011   Hypothyroidism 05/13/2009   HYPERCHOLESTEROLEMIA 05/13/2009   Hyperlipidemia 05/13/2009   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   Essential hypertension 05/13/2009   INGUINAL HERNIA 05/13/2009   HIATAL HERNIA 05/13/2009   NEPHROLITHIASIS 05/13/2009   ROSACEA 05/13/2009   Kerin Perna, PTA 06/14/21 12:19 PM  [facility] [place] [address] [place], [place], [address] Phone: [phone]   Fax:  [phone]  Name: DENNIS HEGEMAN MRN: [id number] Date of Birth: Jan 21, 1941

## 2021-06-14 NOTE — ED Provider Notes (Signed)
[facility] EMERGENCY DEPARTMENT Provider Note   CSN: [id number] Arrival date & time: 06/14/21  1444     History Chief Complaint  Patient presents with   Dizziness   Hypertension    [name] Pratt is a 80 y.o. male.  Patient with past medical history of hypertension, hyperlipidemia, CAD presents today for dizziness.  States that same was acute in onset this morning at physical therapy upon standing after a stretching exercise.  His blood pressure was then recorded to be in the 190s over 130s.  Physical therapy then ended the session, requested that patient call primary care and get an appointment within 24 hours for further evaluation.  He discussed his symptoms with the nurse at his primary care office who scheduled him an appointment tomorrow and stated he should go to the ER if symptoms worsened.  He was initially able to ambulate to his car and drive home with minimal difficulty, however once home he began to experience worsening of symptoms and was only able to ambulate with assistance.  He also had some associated nausea without vomiting.  States that his dizziness feels like the room is spinning and is present upon standing and sitting and rapid head movements.  No history of similar symptoms.  Patient took his blood pressure at home which was in the 782N systolic.  Of note, he is on amlodipine, hydralazine, and losartan for blood pressure management, states he is "pretty sure" that he took his medication as prescribed this morning.  States that his blood pressures normally run in the 140s.  The history is provided by the patient. No language interpreter was used.  Dizziness Associated symptoms: nausea   Associated symptoms: no chest pain, no diarrhea, no headaches, no hearing loss, no palpitations, no shortness of breath, no vomiting and no weakness   Hypertension Pertinent negatives include no chest pain, no abdominal pain, no headaches and no shortness of breath.       Past Medical History:  Diagnosis Date   Aortic atherosclerosis ([place]) 03/05/2020   Arthritis    Cancer ([place])    skin cancer   Carotid stenosis    a. Carotid U/S 5/62: LICA < 13%, RICA 08-65%;  b.  Carotid U/S 7/84:  RICA 69-62%; LICA 9-52%; f/u 1 year   Chest pain     Myoview in 2008 was normal.  Echo 7/09: EF 60%, normal wall motion, mild LVH, mild LAE.   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    Patient Active Problem List   Diagnosis Date Noted   Nasal congestion 04/11/2021   C. 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[place]) 02/09/2021   Visual hallucinations 11/30/2020   Cervical myelopathy (HCC) 11/24/2020   Cystoid macular edema of both eyes 07/28/2020   Status post cervical spinal fusion 07/09/2020   History of total bilateral knee replacement 07/08/2020   Aortic atherosclerosis ([place]) 03/05/2020   Emphysema, unspecified ([place]) 03/05/2020   Heme positive stool 02/29/2020   Leg cramps 11/13/2019   Tibialis anterior tendon tear, nontraumatic 11/13/2019   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 07/29/2018   CPAP (continuous positive airway pressure) dependence 03/25/2018   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   Acute idiopathic gout of hand 03/25/2018   Erectile dysfunction 06/20/2017   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 03/06/2017   Peripheral neuropathy 06/19/2016   Hearing loss 02/02/2016   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   Allergic rhinitis 02/02/2016   Atypical chest pain 11/24/2015   Preventative health care 11/24/2015   Onychomycosis 06/30/2015   Degenerative disc disease, lumbar 06/30/2015   Other allergic rhinitis 02/18/2015   Deviated nasal septum 02/18/2015   OSA on CPAP 02/18/2015   RLS (restless legs syndrome) 02/18/2015   GERD (gastroesophageal reflux disease) 09/18/2014   Hyperglycemia 03/03/2014   Sleep apnea with use of continuous positive airway pressure (CPAP) 07/15/2013   Carotid stenosis 02/24/2013   Nonspecific abnormal electrocardiogram (ECG) (EKG) 01/06/2013   DJD (degenerative joint disease) of hip 12/24/2012   Lower GI bleed 12/12/2012   Dyspnea 06/12/2011   Hypothyroidism 05/13/2009   HYPERCHOLESTEROLEMIA 05/13/2009   Hyperlipidemia 05/13/2009   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   Essential hypertension 05/13/2009   INGUINAL HERNIA 05/13/2009   HIATAL HERNIA 05/13/2009   NEPHROLITHIASIS 05/13/2009   ROSACEA 05/13/2009    Past Surgical History:  Procedure Laterality Date   ABDOMINAL HERNIA REPAIR  07/15/12   Dr [name]   ANTERIOR CERVICAL DECOMP/DISCECTOMY FUSION N/A 07/09/2020   Procedure: ANTERIOR CERVICAL DECOMPRESSION AND FUSION CERVICAL THREE-FOUR.;  Surgeon: [name], MD;  Location: [facility];  Service: Neurosurgery;  Laterality: N/A;  anterior   BROW LIFT  05/07/01   CYSTOSCOPY WITH URETEROSCOPY AND STENT PLACEMENT Right 01/28/2014   Procedure: CYSTOSCOPY WITH URETEROSCOPY, BASKET RETRIVAL AND  STENT PLACEMENT;  Surgeon: [name], MD;  Location: [facility];  Service: Urology;  Laterality: Right;   epidural  injections     multiple procedures   ESOPHAGEAL DILATION  1993 and 1994   multiple times   EYE SURGERY Bilateral 03/25/10, 2012   cataract removal   HEMORRHOID SURGERY  2014   HIATAL HERNIA REPAIR  12/21/93   HOLMIUM LASER APPLICATION Right 9/79/8921   Procedure: HOLMIUM LASER APPLICATION;  Surgeon: [name], MD;  Location: [facility];  Service: Urology;  Laterality: Right;   INGUINAL HERNIA REPAIR Right 07/15/12   Dr [name], x2   JOINT REPLACEMENT  07/25/04   right knee   JOINT REPLACEMENT  07/13/10   left knee   [place]  09/20/06   with intraoperative cholangiogram and right inguinal herniorrhaphy with mesh    LIGAMENT REPAIR Left 07/2013   shoulder   LITHOTRIPSY     x2   [place] FUSION/FORAMINOTOMY N/A 11/24/2020   Procedure: Posterior Cervical Laminectomy Cervical three-four, Cervical four-five with lateral mass fusion;  Surgeon: [name], MD;  Location: [facility];  Service: Neurosurgery;  Laterality: N/A;  REFRACTIVE SURGERY Bilateral    Right total hip replacement  4/14   SKIN CANCER DESTRUCTION     nose, ear   TONSILLECTOMY  as child   TOTAL HIP ARTHROPLASTY Left    URETHRAL DILATION  1991   [name]       Family History  Problem Relation Age of Onset   Cancer Mother    Hypertension Other    Cancer Other    Osteoarthritis Other     Social History   Tobacco Use   Smoking status: Former    Years: 11.00    Types: Cigarettes    Quit date: 09/11/1978    Years since quitting: 42.7   Smokeless tobacco: Never  Vaping Use   Vaping Use: Never used  Substance Use Topics   Alcohol use: Not Currently    Alcohol/week: 0.0 standard drinks    Comment: rare   Drug use: No    Home Medications Prior to Admission medications   Medication Sig Start Date End Date Taking? Authorizing Provider  acetaminophen (TYLENOL) 650 MG CR tablet Take 650-1,300 mg by mouth See admin instructions. Take  1300 mg in the morning and 650 g at bedtime    [provider]  albuterol (VENTOLIN HFA) 108 (90 Base) MCG/ACT inhaler Inhale 2 puffs into the lungs every 6 (six) hours as needed for shortness of breath. 06/15/20   [name], NP  Alcohol Swabs (ALCOHOL PADS) 70 % PADS 1 packet by Does not apply route 2 (two) times daily. 11/22/20   [name], NP  allopurinol (ZYLOPRIM) 100 MG tablet TAKE 1 TABLET TWICE DAILY 10/30/20   [name], NP  amLODipine (NORVASC) 5 MG tablet TAKE 1 TABLET EVERY DAY 01/07/21   [name], NP  Artificial Tear Solution (SOOTHE XP) SOLN Place 1 drop into both eyes 2 (two) times daily as needed (Dry eyes).    [provider]  aspirin EC 81 MG tablet Take 81 mg by mouth every morning.    [provider]  blood glucose meter kit and supplies Dispense based on patient and insurance preference. Use up to four times daily as directed. (FOR ICD-10 E10.9, E11.9). 11/19/20   [name], NP  Cholecalciferol (VITAMIN D) 50 MCG (2000 UT) CAPS Take 2,000 Units by mouth daily.    [provider]  cholestyramine (QUESTRAN) 4 g packet Take 1 packet (4 g total) by mouth 2 (two) times daily as needed. 03/02/21   [name], NP  colchicine 0.6 MG tablet Take 1 tablet by mouth daily as needed (Gout). 12/04/05   [provider]  Cranberry 500 MG TABS Take 500 mg by mouth daily.     [provider]  cycloSPORINE (RESTASIS) 0.05 % ophthalmic emulsion Place 1 drop into both eyes 2 (two) times daily.    [provider]  diclofenac Sodium (VOLTAREN) 1 % GEL Apply 2 g topically daily as needed (Hand pain).    [provider]  gabapentin (NEURONTIN) 300 MG capsule Take 300 mg by mouth 2 (two) times daily as needed.    [provider]  glucose blood test strip TRUE Metrix Blood Glucose Test Strip, use as instructed 11/12/20   [name], NP  Homeopathic Products ([place]) FOAM Apply 1 application topically 2 (two) times daily as needed (Pain).    [provider]  hydrALAZINE (APRESOLINE) 50 MG tablet TAKE 1 TABLET THREE TIMES DAILY 03/28/21   [name], NP  Lancets MISC 1 each by  Does not apply route 2 (two) times daily. TRUE matrix lancets 11/12/20   [name], NP  levothyroxine (SYNTHROID) 75 MCG tablet Take 2 tabs by mouth once daily 6 days a week and 1 tab by mouth once daily one day a week 03/29/21   [name], NP  losartan (COZAAR) 100 MG tablet Take 1 tablet (100 mg total) by mouth daily. 01/13/21   [name], NP  Magnesium 250 MG TABS Take by mouth.    [provider]  Menthol, Topical Analgesic, (ZIMS MAX-FREEZE EX) Apply 1 application topically daily as needed (pain).    [provider]  multivitamin [facility]) per tablet Take 1 tablet by mouth daily.    [provider]  nitroGLYCERIN (NITROSTAT) 0.4 MG SL tablet Place 1 tablet (0.4 mg total) under the tongue every 5 (five) minutes as needed for chest pain. 03/11/18   [name], MD  Omega-3 Fatty Acids (FISH OIL) 1200 MG CAPS Take 1,200 mg by mouth daily.    [provider]  rosuvastatin (CRESTOR) 5 MG tablet TAKE 1 TABLET EVERY DAY 04/14/21   [name], NP  sodium chloride (OCEAN) 0.65 % nasal spray Place 1 spray into both nostrils at bedtime as needed for congestion.    [provider]    Allergies    Pravastatin, Sildenafil, Metoclopramide, Oxycodone, Atenolol, Elastic bandages & [zinc], and Metoclopramide hcl  Review of Systems   Review of Systems  Constitutional:  Negative for chills, fatigue, fever and unexpected weight change.  HENT:  Negative for congestion, ear discharge, ear pain, facial swelling, hearing loss, postnasal drip, rhinorrhea, sore throat, trouble swallowing and voice change.   Respiratory:  Negative for cough, shortness of breath, wheezing and stridor.   Cardiovascular:   Negative for chest pain, palpitations and leg swelling.  Gastrointestinal:  Positive for nausea. Negative for abdominal distention, abdominal pain, constipation, diarrhea and vomiting.  Musculoskeletal:  Positive for gait problem. Negative for neck pain and neck stiffness.  Skin:  Negative for rash and wound.  Neurological:  Positive for dizziness. Negative for tremors, seizures, syncope, facial asymmetry, speech difficulty, weakness, light-headedness, numbness and headaches.  Psychiatric/Behavioral:  Negative for confusion and decreased concentration.   All other systems reviewed and are negative.  Physical Exam Updated Vital Signs BP (!) 177/73 (BP Location: Right Arm)   Pulse (!) 57   Temp 97.9 F (36.6 C) (Oral)   Resp 18   Ht _0  (1.702 m)   Wt 66.7 kg   SpO2 97%   BMI 23.02 kg/m   Physical Exam Vitals and nursing note reviewed.  Constitutional:      General: He is not in acute distress.    Appearance: Normal appearance. He is normal weight. He is not ill-appearing, toxic-appearing or diaphoretic.  HENT:     Head: Normocephalic and atraumatic.     Right Ear: Tympanic membrane, ear canal and external ear normal.     Left Ear: Tympanic membrane, ear canal and external ear normal.  Eyes:     Extraocular Movements: Extraocular movements intact.     Right eye: Normal extraocular motion and no nystagmus.     Left eye: Normal extraocular motion and no nystagmus.     Pupils: Pupils are equal, round, and reactive to light.  Cardiovascular:     Rate and Rhythm: Normal rate and regular rhythm.     Pulses: Normal pulses.          Radial pulses are 2+ on the right  side and 2+ on the left side.     Heart sounds: Normal heart sounds.  Pulmonary:     Effort: Pulmonary effort is normal. No respiratory distress.     Breath sounds: Normal breath sounds and air entry. No decreased breath sounds, wheezing, rhonchi or rales.  Abdominal:     General: Abdomen is flat. Bowel sounds are  normal.     Palpations: Abdomen is soft.     Tenderness: There is no abdominal tenderness.  Musculoskeletal:        General: Normal range of motion.     Cervical back: Normal range of motion and neck supple.     Right lower leg: No edema.     Left lower leg: No edema.  Skin:    General: Skin is warm and dry.  Neurological:     General: No focal deficit present.     Mental Status: He is alert.     GCS: GCS eye subscore is 4. GCS verbal subscore is 5. GCS motor subscore is 6.     Cranial Nerves: Cranial nerves are intact.     Sensory: Sensation is intact.     Motor: Motor function is intact.     Coordination: Coordination is intact. Coordination normal. Finger-Nose-Finger Test and Heel to [facility] Test normal.     Comments: No focal neurologic deficits.  Psychiatric:        Mood and Affect: Mood normal.        Behavior: Behavior normal.    ED Results / Procedures / Treatments   Labs (all labs ordered are listed, but only abnormal results are displayed) Labs Reviewed  BASIC METABOLIC PANEL - Abnormal; Notable for the following components:      Result Value   Potassium 3.4 (*)    Glucose, Bld 120 (*)    BUN 29 (*)    Calcium 8.8 (*)    All other components within normal limits  CBC - Abnormal; Notable for the following components:   RBC 3.89 (*)    Hemoglobin 12.8 (*)    HCT 38.4 (*)    All other components within normal limits  URINALYSIS, ROUTINE W REFLEX MICROSCOPIC - Abnormal; Notable for the following components:   Protein, ur 100 (*)    All other components within normal limits  URINALYSIS, MICROSCOPIC (REFLEX) - Abnormal; Notable for the following components:   Bacteria, UA RARE (*)    All other components within normal limits  TROPONIN I (HIGH SENSITIVITY) - Abnormal; Notable for the following components:   Troponin I (High Sensitivity) 19 (*)    All other components within normal limits  TROPONIN I (HIGH SENSITIVITY) - Abnormal; Notable for the following components:    Troponin I (High Sensitivity) 18 (*)    All other components within normal limits  TROPONIN I (HIGH SENSITIVITY)    EKG   Radiology CT Head Wo Contrast  Result Date: 06/14/2021 CLINICAL DATA:  Dizziness. EXAM: CT HEAD WITHOUT CONTRAST TECHNIQUE: Contiguous axial images were obtained from the base of the skull through the vertex without intravenous contrast. COMPARISON:  CT head 09/08/2020. FINDINGS: Brain: No evidence of acute infarction, hemorrhage, hydrocephalus, extra-axial collection or mass lesion/mass effect. There is mild periventricular white matter hypodensity, likely chronic small vessel ischemic change. 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 Vascular: No hyperdense vessel or unexpected calcification. Skull: Normal. Negative for fracture or focal lesion. Sinuses/Orbits: No acute finding. Other: None. IMPRESSION: No acute intracranial  abnormality. Mild chronic small vessel ischemic change. Electronically Signed   By: Ronney Asters M.D.   On: 06/14/2021 18:21    Procedures Procedures   Medications Ordered in ED Medications  meclizine (ANTIVERT) tablet 12.5 mg (12.5 mg Oral Given 06/14/21 1726)  sodium chloride 0.9 % bolus 500 mL (0 mLs Intravenous Stopped 06/14/21 2129)    ED Course  I have reviewed the triage vital signs and the nursing notes.  Pertinent labs & imaging results that were available during my care of the patient were reviewed by me and considered in my medical decision making (see chart for details).    MDM Rules/Calculators/A&P                         Patient presents to the ED with complaints of dizziness. Nontoxic, vitals show patient hypertensive in the 459X systolic.   Ddx: The emergent differential diagnosis for acute vertigo includes peripheral causes such as BPPV, ear foreign body, Mnire's disease, infectious causes such as vestibular neuritis or Ramsay Hunt syndrome.  Other emergent causes are central such as  cerebellar stroke,  neoplastic causes, epilepsy or migraine.  Other causes include anemia, renal failure, and hypoglycemia.   Additional history obtained:  Additional history obtained from chart review & nursing note review.   Lab Tests:  I Ordered, reviewed, and interpreted labs, which included:  Troponin, CBC, BMP, UA Labs unremarkable for electrolyte abnormalities or leukocytosis. Mild anemia noted to be baseline. UA remarkable for proteinuria, appears patient has a history of same. First troponin negative, second noted to be slightly elevated at 19, third pending at shift change   Imaging Studies ordered:  I ordered imaging studies which included CT head without contrast, I independently reviewed, formal radiology impression shows:  CT head unremarkable for acute intracranial abnormality  ED Course:  Patient presents with dizziness that began earlier today. States its worse when he moves his eyes or his head, and causes a spinning sensation and lightheadedness with nausea. No neurologic deficits including unremarkable cerebellar testing. No chest pain, shortness of breath, palpitations, or leg swelling. One minimally elevated troponin, awaiting subsequent troponin at shift change.   Patient given meclizine and fluid bolus and his symptoms have now completed resolved. He is able to ambulate normally without dizziness, gait is steady without assistance. Given complete resolution of symptoms, doubt central cause of symptoms.  Awaiting third troponin result to determine disposition. If this normalizes, patient will be cleared for discharge with close PCP follow-up for continuing management of symptoms.  Care handoff to Dr. Tamera Punt at shift change.   Portions of this note were generated with [employer]. Dictation errors may occur despite best attempts at proofreading.    This is a shared visit with supervising physician Dr. Tamera Punt who has independently evaluated patient &  provided guidance in evaluation/management/disposition, in agreement with care     Final Clinical Impression(s) / ED Diagnoses Final diagnoses:  None    Rx / DC Orders ED Discharge Orders     None        Nestor Lewandowsky 06/15/21 2259    Malvin Johns, MD 06/23/21 [id number]

## 2021-06-14 NOTE — ED Provider Notes (Signed)
Patient is a 80 year old who presents with dizziness.  He had an episode while he was at physical therapy and his blood pressure is noted to be elevated at that point.  He felt better he drove home.  At home he had another episode of dizziness which persisted till he got to the emergency room.  He describes it as worse when he moves his eyes or moves his head around.  There is a little bit of spinning sensation.  Also a little bit of lightheadedness.  No associated headache.  He had some nausea with the dizziness.  He does not have a diagnosis of prior vertigo but has had some episodes where he is turned to look at the clock and has had some dizziness.  These episodes have not lasted very long.  He has no neurologic deficits.  No chest pain or shortness of breath.  No other anginal type symptoms.  He had 1 troponin that was minimally elevated but a repeat had normalized.  He got a dose of meclizine here and his symptoms have completely resolved.  He is able to ambulate without dizziness.  He moves his head around without dizziness.  Given this, I feel that it would be unlikely to be a central etiology for the vertigo.  He was discharged home in good condition.  He was given a prescription for meclizine.  He has an appointment tomorrow to follow-up with his PCP.   Malvin Johns, MD 06/14/21 2115

## 2021-06-15 ENCOUNTER — Telehealth: Payer: Self-pay | Admitting: *Deleted

## 2021-06-15 ENCOUNTER — Ambulatory Visit (INDEPENDENT_AMBULATORY_CARE_PROVIDER_SITE_OTHER): Payer: Medicare HMO | Admitting: Medical

## 2021-06-15 ENCOUNTER — Telehealth: Payer: Self-pay | Admitting: Medical

## 2021-06-15 ENCOUNTER — Encounter (HOSPITAL_BASED_OUTPATIENT_CLINIC_OR_DEPARTMENT_OTHER): Payer: Self-pay | Admitting: Emergency Medicine

## 2021-06-15 ENCOUNTER — Other Ambulatory Visit (HOSPITAL_BASED_OUTPATIENT_CLINIC_OR_DEPARTMENT_OTHER): Payer: Self-pay

## 2021-06-15 VITALS — BP 145/60 | HR 76 | Temp 98.3°F | Resp 18 | Ht 67.0 in | Wt 156.2 lb

## 2021-06-15 DIAGNOSIS — R778 Other specified abnormalities of plasma proteins: Secondary | ICD-10-CM

## 2021-06-15 DIAGNOSIS — R42 Dizziness and giddiness: Secondary | ICD-10-CM

## 2021-06-15 DIAGNOSIS — E039 Hypothyroidism, unspecified: Secondary | ICD-10-CM | POA: Diagnosis not present

## 2021-06-15 DIAGNOSIS — R5383 Other fatigue: Secondary | ICD-10-CM | POA: Diagnosis not present

## 2021-06-15 LAB — CBC WITH DIFFERENTIAL/PLATELET
Basophils Absolute: 0 10*3/uL (ref 0.0–0.1)
Basophils Relative: 0.7 % (ref 0.0–3.0)
Eosinophils Absolute: 0.1 10*3/uL (ref 0.0–0.7)
Eosinophils Relative: 2.2 % (ref 0.0–5.0)
HCT: 39.2 % (ref 39.0–52.0)
Hemoglobin: 12.8 g/dL — ABNORMAL LOW (ref 13.0–17.0)
Lymphocytes Relative: 19 % (ref 12.0–46.0)
Lymphs Abs: 1.2 10*3/uL (ref 0.7–4.0)
MCHC: 32.7 g/dL (ref 30.0–36.0)
MCV: 99.4 fl (ref 78.0–100.0)
Monocytes Absolute: 0.6 10*3/uL (ref 0.1–1.0)
Monocytes Relative: 10.1 % (ref 3.0–12.0)
Neutro Abs: 4.2 10*3/uL (ref 1.4–7.7)
Neutrophils Relative %: 68 % (ref 43.0–77.0)
Platelets: 324 10*3/uL (ref 150.0–400.0)
RBC: 3.95 Mil/uL — ABNORMAL LOW (ref 4.22–5.81)
RDW: 14.2 % (ref 11.5–15.5)
WBC: 6.2 10*3/uL (ref 4.0–10.5)

## 2021-06-15 LAB — COMPREHENSIVE METABOLIC PANEL
ALT: 30 U/L (ref 0–53)
AST: 30 U/L (ref 0–37)
Albumin: 4.2 g/dL (ref 3.5–5.2)
Alkaline Phosphatase: 73 U/L (ref 39–117)
BUN: 27 mg/dL — ABNORMAL HIGH (ref 6–23)
CO2: 27 mEq/L (ref 19–32)
Calcium: 9.3 mg/dL (ref 8.4–10.5)
Chloride: 102 mEq/L (ref 96–112)
Creatinine, Ser: 1.22 mg/dL (ref 0.40–1.50)
GFR: 55.67 mL/min — ABNORMAL LOW (ref >60.00)
Glucose, Bld: 145 mg/dL — ABNORMAL HIGH (ref 70–99)
Potassium: 3.1 mEq/L — ABNORMAL LOW (ref 3.5–5.1)
Sodium: 140 mEq/L (ref 135–145)
Total Bilirubin: 0.5 mg/dL (ref 0.2–1.2)
Total Protein: 7.5 g/dL (ref 6.0–8.3)

## 2021-06-15 LAB — TROPONIN I (HIGH SENSITIVITY): High Sens Troponin I: 21 ng/L (ref 2–17)

## 2021-06-15 MED ORDER — POTASSIUM CHLORIDE CRYS ER 10 MEQ PO TBCR: 10.0000 meq | EXTENDED_RELEASE_TABLET | Freq: Once | ORAL | 0 refills | Status: DC

## 2021-06-15 NOTE — Addendum Note (Signed)
Addended by: Anabel Halon on: 06/15/2021 04:36 PM   Modules accepted: Orders

## 2021-06-15 NOTE — Telephone Encounter (Signed)
CRITICAL VALUE STICKER  CRITICAL VALUE: Troponin I HS -- 21  RECEIVER (on-site recipient of call): Kelle Darting, [place] NOTIFIED: 06/15/21 @ 2:17pm  MESSENGER (representative from lab):  Roger Kill  MD NOTIFIED: Mackie Pai, PA-C  TIME OF NOTIFICATION:  RESPONSE:

## 2021-06-15 NOTE — Progress Notes (Signed)
Subjective:    Patient ID: [name] Pratt, male    DOB: [birth year], 80 y.o.   MRN: [id number]  HPI  Pt states he had some high blood pressure yesterday. During PT his bp was 191/diastolic?[name]   He states during therapy had severe vertigo and had to stop therapy.   He went home and bp was 180/70.  Pt went to ED last night.   "Patient is a 80 year old who presents with dizziness.  He had an episode while he was at physical therapy and his blood pressure is noted to be elevated at that point.  He felt better he drove home.  At home he had another episode of dizziness which persisted till he got to the emergency room.  He describes it as worse when he moves his eyes or moves his head around.  There is a little bit of spinning sensation.  Also a little bit of lightheadedness.  No associated headache.  He had some nausea with the dizziness.  He does not have a diagnosis of prior vertigo but has had some episodes where he is turned to look at the clock and has had some dizziness.  These episodes have not lasted very long.  He has no neurologic deficits.  No chest pain or shortness of breath.  No other anginal type symptoms.  He had 1 troponin that was minimally elevated but a repeat had normalized.  He got a dose of meclizine here and his symptoms have completely resolved.  He is able to ambulate without dizziness.  He moves his head around without dizziness.  Given this, I feel that it would be unlikely to be a central etiology for the vertigo.  He was discharged home in good condition.  He was given a prescription for meclizine.  He has an appointment tomorrow to follow-up with his PCP."   Hx of htn has been on losartan, amlodipine and hydralazine.    Pt denied any chest pain. No ha, no gross motor/sensory function deficits. He had ekg in ED. On review second troponin level was in normal limits.   ED ekg reviewed today. In the past he gets very low level pain in chest in past. Brief pain. Sharp.  2 years ago Dr. Johnsie Cancel prescribed nitro. He has used twice in past separate events. Last time used one year ago.  Ct 06-14-21  IMPRESSION: No acute intracranial abnormality.   Mild chronic small vessel ischemic change.   This morning when got up felt mild transient vertigo when he turned over in bed. Lasted for about 5 seconds.  No pedal edema.    Review of Systems  Constitutional:  Negative for chills, fatigue and fever.  HENT:  Negative for congestion and drooling.   Respiratory:  Negative for cough, chest tightness, shortness of breath and wheezing.   Cardiovascular:  Negative for chest pain and palpitations.  Gastrointestinal:  Negative for abdominal pain.  Genitourinary:  Negative for dysuria, enuresis and flank pain.  Musculoskeletal:  Negative for back pain.  Skin:  Negative for rash.  Neurological:  Positive for dizziness and light-headedness. Negative for syncope, weakness and numbness.       See hpi.  Psychiatric/Behavioral:  Negative for confusion.     Past Medical History:  Diagnosis Date   Aortic atherosclerosis ([place]) 03/05/2020   Arthritis    Cancer ([place])    skin cancer   Carotid stenosis    a. Carotid U/S 5/46: LICA < 56%, RICA 81-27%;  b.  Carotid  U/S 7/89:  RICA 38-10%; LICA 1-75%; f/u 1 year   Chest pain     Myoview in 2008 was normal.  Echo 7/09: EF 60%, normal wall motion, mild LVH, mild LAE.   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     Social History   Socioeconomic History   Marital status: Married    Spouse name: Not on file   Number of children: 1   Years of education: Not on file   Highest education level: Not on file  Occupational History   Occupation: firefighter    Comment: paints on the side  Tobacco Use   Smoking status: Former    Years: 11.00    Types: Cigarettes    Quit date: 09/11/1978    Years since quitting: 42.7   Smokeless tobacco: Never  Vaping Use   Vaping Use: Never used  Substance and Sexual Activity   Alcohol use: Not Currently    Alcohol/week: 0.0 standard drinks    Comment: rare   Drug use: No   Sexual activity: Not on file  Other Topics Concern   Not on file  Social History Narrative   Lives with his wife   He has one son- lives locally.   2 grandchildren   Has worked for [employer], trade [employer]   Enjoys wood working   Completed HS.  [employer]   Social Determinants of Health   Financial Resource Strain: Not on file  Food Insecurity: Not on file  Transportation Needs: Not on file  Physical Activity: Not on file  Stress: Not on file  Social Connections: Not on file  Intimate Partner Violence: Not on file    Past Surgical History:  Procedure Laterality Date   ABDOMINAL HERNIA REPAIR  07/15/12   Dr [name]   ANTERIOR CERVICAL DECOMP/DISCECTOMY FUSION N/A 07/09/2020   Procedure: ANTERIOR CERVICAL DECOMPRESSION AND FUSION CERVICAL THREE-FOUR.;  Surgeon: [name], MD;  Location: [facility];  Service: Neurosurgery;  Laterality: N/A;  anterior   BROW LIFT  05/07/01   CYSTOSCOPY WITH URETEROSCOPY AND STENT PLACEMENT Right 01/28/2014   Procedure: CYSTOSCOPY WITH URETEROSCOPY, BASKET RETRIVAL AND  STENT PLACEMENT;  Surgeon: [name], MD;  Location: [facility];  Service: Urology;  Laterality: Right;   epidural injections     multiple procedures   ESOPHAGEAL DILATION  1993 and 1994    multiple times   EYE SURGERY Bilateral 03/25/10, 2012   cataract removal   HEMORRHOID SURGERY  2014   HIATAL HERNIA REPAIR  12/21/93   HOLMIUM LASER APPLICATION Right 09/12/5850   Procedure: HOLMIUM LASER APPLICATION;  Surgeon: [name], MD;  Location: [facility];  Service: Urology;  Laterality: Right;   INGUINAL HERNIA REPAIR Right 07/15/12   Dr  [name], x2   JOINT REPLACEMENT  07/25/04   right knee   JOINT REPLACEMENT  07/13/10   left knee   KNEE SURGERY  1995   LAPAROSCOPIC CHOLECYSTECTOMY  09/20/06   with intraoperative cholangiogram and right inguinal herniorrhaphy with mesh    LIGAMENT REPAIR Left 07/2013   shoulder   LITHOTRIPSY     x2   [place] FUSION/FORAMINOTOMY N/A 11/24/2020   Procedure: Posterior Cervical Laminectomy Cervical three-four, Cervical four-five with lateral mass fusion;  Surgeon: [name], MD;  Location: [facility];  Service: Neurosurgery;  Laterality: N/A;   REFRACTIVE SURGERY Bilateral    Right total hip replacement  4/14   SKIN CANCER DESTRUCTION     nose, ear   TONSILLECTOMY  as child   TOTAL HIP ARTHROPLASTY Left    URETHRAL DILATION  1991   [name]    Family History  Problem Relation Age of Onset   Cancer Mother    Hypertension Other    Cancer Other    Osteoarthritis Other     Allergies  Allergen Reactions   Pravastatin Other (See Comments)    Muscle cramps   Sildenafil Other (See Comments)    Tachycardia     Metoclopramide    Oxycodone     hallucinations   Atenolol Other (See Comments)    bradycardia   Elastic Bandages & [Zinc] Other (See Comments)    Teflon bandages - Irritation   Metoclopramide Hcl Anxiety    Current Outpatient Medications on File Prior to Visit  Medication Sig Dispense Refill   acetaminophen (TYLENOL) 650 MG CR tablet Take 650-1,300 mg by mouth See admin instructions. Take 1300 mg in the morning and 650 g at bedtime     albuterol (VENTOLIN HFA) 108 (90 Base) MCG/ACT  inhaler Inhale 2 puffs into the lungs every 6 (six) hours as needed for shortness of breath. 1 each 3   Alcohol Swabs (ALCOHOL PADS) 70 % PADS 1 packet by Does not apply route 2 (two) times daily. 100 each 1   allopurinol (ZYLOPRIM) 100 MG tablet TAKE 1 TABLET TWICE DAILY 180 tablet 1   amLODipine (NORVASC) 5 MG tablet TAKE 1 TABLET EVERY DAY 90 tablet 1   Artificial Tear Solution (SOOTHE XP) SOLN Place 1 drop into both eyes 2 (two) times daily as needed (Dry eyes).     aspirin EC 81 MG tablet Take 81 mg by mouth every morning.     blood glucose meter kit and supplies Dispense based on patient and insurance preference. Use up to four times daily as directed. (FOR ICD-10 E10.9, E11.9). 1 each 0   Cholecalciferol (VITAMIN D) 50 MCG (2000 UT) CAPS Take 2,000 Units by mouth daily.     cholestyramine (QUESTRAN) 4 g packet Take 1 packet (4 g total) by mouth 2 (two) times daily as needed. 60 each 2   colchicine 0.6 MG tablet Take 1 tablet by mouth daily as needed (Gout).     Cranberry 500 MG TABS Take 500 mg by mouth daily.      cycloSPORINE (RESTASIS) 0.05 % ophthalmic emulsion Place 1 drop into both eyes 2 (two) times daily.     diclofenac Sodium (VOLTAREN) 1 % GEL Apply 2 g topically daily as needed (Hand pain).     gabapentin (NEURONTIN) 300 MG capsule Take 300 mg by mouth 2 (two) times daily as needed.     glucose blood test strip TRUE Metrix Blood Glucose Test  Strip, use as instructed 100 each 12   Homeopathic Products (THERAWORX RELIEF) FOAM Apply 1 application topically 2 (two) times daily as needed (Pain).     hydrALAZINE (APRESOLINE) 50 MG tablet TAKE 1 TABLET THREE TIMES DAILY 270 tablet 2   Lancets MISC 1 each by Does not apply route 2 (two) times daily. TRUE matrix lancets 100 each 3   levothyroxine (SYNTHROID) 75 MCG tablet Take 2 tabs by mouth once daily 6 days a week and 1 tab by mouth once daily one day a week 180 tablet 0   losartan (COZAAR) 100 MG tablet Take 1 tablet (100 mg total) by  mouth daily. 90 tablet 1   Magnesium 250 MG TABS Take by mouth.     meclizine (ANTIVERT) 25 MG tablet Take 1 tablet (25 mg total) by mouth 3 (three) times daily as needed for dizziness. 20 tablet 0   Menthol, Topical Analgesic, (ZIMS MAX-FREEZE EX) Apply 1 application topically daily as needed (pain).     multivitamin (THERAGRAN) per tablet Take 1 tablet by mouth daily.     nitroGLYCERIN (NITROSTAT) 0.4 MG SL tablet Place 1 tablet (0.4 mg total) under the tongue every 5 (five) minutes as needed for chest pain. 25 tablet 3   Omega-3 Fatty Acids (FISH OIL) 1200 MG CAPS Take 1,200 mg by mouth daily.     rosuvastatin (CRESTOR) 5 MG tablet TAKE 1 TABLET EVERY DAY 90 tablet 0   sodium chloride (OCEAN) 0.65 % nasal spray Place 1 spray into both nostrils at bedtime as needed for congestion.     No current facility-administered medications on file prior to visit.    BP (!) 145/60   Pulse 76   Temp 98.3 F (36.8 C)   Resp 18   Ht _0  (1.702 m)   Wt 156 lb 3.2 oz (70.9 kg)   SpO2 98%   BMI 24.46 kg/m       Objective:   Physical Exam  General Mental Status- Alert. General Appearance- Not in acute distress.   Skin General: Color- Normal Color. Moisture- Normal Moisture.  Neck Carotid Arteries- Normal color. Moisture- Normal Moisture. No carotid bruits. No JVD.  Chest and Lung Exam Auscultation: Breath Sounds:-Normal.  Cardiovascular Auscultation:Rythm- Regular. Murmurs & Other Heart Sounds:Auscultation of the heart reveals- No Murmurs.  Abdomen Inspection:-Inspeection Normal. Palpation/Percussion:Note:No mass. Palpation and Percussion of the abdomen reveal- Non Tender, Non Distended + BS, no rebound or guarding.    Neurologic Cranial Nerve exam:- CN III-XII intact(No nystagmus), symmetric smile. Drift Test:- No drift. Finger to Nose:- Normal/Intact Strength:- 5/5 equal and symmetric strength both upper and lower extremities.  Lying supine no dizziness or vertigo. Turning  head to rt no dizziness or vertigo. On on turning head to left faint brief light headed for 2-3 second.  On returing to sitting position no dizziness or vertigo.     Assessment & Plan:  [phone] pt cell.  Patient Instructions  Dizziness and vertigo much improve/almost resolved now. Bp is much better today. Can take the meclizine that ED prescribed if needed for dizziness.  If dizziness or vertigo with any motor or sensory deficits then ED evaluation again.  If you have recurrent vertigo alone  consider referral to PT for Epley maneuvers.  Your blood pressure is much better today.  Continue your 3 medication regimen.  Check blood pressures daily.  If you have get any blood pressure elevation above 155/90 notify us.  Give [place] an update on Friday on daily blood  pressure readings.  I did discuss/consult with cardiologist EKG done in the emergency department and EKG done today.  Also notified of slight cardiac protein elevation.  He thinks troponin elevation is minimal and was likely related to severe high blood pressure yesterday.   EKG today on repeat showed possible mild ST elevation in 2, 3 and avf.  Cardiologist thoguth minimal elevation  and likely insignificant as patient is asymptomatic which is the case.  We will get stat troponin 1 set.  If significant elevation then would advise ED evaluation again.  Is normal or borderline elevation will  not recommend ED evaluation.  We will go ahead and try to get patient scheduled/follow-up with his cardiologist which he has not seen in about a year or more.  Follow-up early next week with PCP or sooner if needed.    Mackie Pai, PA-C    Time spent with patient today was  45+ minutes which consisted of chart review, discussing diagnosis, work up ,treatment, consultation with cardiologist  and documentation.

## 2021-06-15 NOTE — Addendum Note (Signed)
Addended by: Anabel Halon on: 06/15/2021 04:21 PM   Modules accepted: Orders

## 2021-06-15 NOTE — Telephone Encounter (Signed)
Opened to review 

## 2021-06-15 NOTE — Telephone Encounter (Signed)
Melissa,  Wanted you to see his office note. Bit complicated ED visit yesterday as his troponin was elevated. Repeat came down They discharged him and I saw him today. I thought abnormal ekg. Discussed and got opinion from Dr. Kirkland Hun. In the end of various ekg's and repeat troponin he did not think ED evaluation was needed. If you would review the office note and labs. He is going to see you next week.

## 2021-06-15 NOTE — Patient Instructions (Addendum)
Dizziness and vertigo much improve/almost resolved now. Bp is much better today. Can take the meclizine that ED prescribed if needed for dizziness.  If dizziness or vertigo with any motor or sensory deficits then ED evaluation again.  If you have recurrent vertigo alone  consider referral to PT for Epley maneuvers.  Your blood pressure is much better today.  Continue your 3 medication regimen.  Check blood pressures daily.  If you have get any blood pressure elevation above 155/90 notify us.  Give [place] an update on Friday on daily blood pressure readings.  I did discuss/consult with cardiologist EKG done in the emergency department and EKG done today.  Also notified of slight cardiac protein elevation.  He thinks troponin elevation is minimal and was likely related to severe high blood pressure yesterday.   EKG today on repeat showed possible mild ST elevation in 2, 3 and avf.  Cardiologist (Dr. Sharyne Peach minimal elevation  and likely insignificant as patient if asymptomatic which is the case.  We will get stat troponin 1 set.  If significant elevation then would advise ED evaluation again.  Is normal or borderline elevation will  not recommend ED evaluation per cardiologist.  We will go ahead and try to get patient scheduled/follow-up with his cardiologist which he has not seen in about a year or more.  Follow-up early next week with PCP or sooner if needed.

## 2021-06-16 ENCOUNTER — Telehealth: Payer: Self-pay | Admitting: Family

## 2021-06-16 NOTE — Telephone Encounter (Signed)
Noted. Let me know if he develops persistent diarrhea.

## 2021-06-16 NOTE — Telephone Encounter (Signed)
Patient would like for someone to call him back regarding constipation. Patient has not had a bowel movement in 3 days. Please advise.

## 2021-06-16 NOTE — Telephone Encounter (Signed)
FYI Patient wanted to let you know he was constipated, had bm today and stoll is now soft almos like diarrhea. Has appointment at the end of month.

## 2021-06-17 LAB — T4, FREE: Free T4: 0.94 ng/dL (ref 0.60–1.60)

## 2021-06-17 LAB — TSH: TSH: 2.77 u[IU]/mL (ref 0.35–5.50)

## 2021-06-20 ENCOUNTER — Telehealth: Payer: Self-pay | Admitting: Family

## 2021-06-20 NOTE — Telephone Encounter (Signed)
Patient wanted to update S[place]ier on how he is doing following his  10/05 visit with him. He states he has been doing ok and his bps have been stable. If he has any issues he will call back.

## 2021-06-21 ENCOUNTER — Ambulatory Visit (INDEPENDENT_AMBULATORY_CARE_PROVIDER_SITE_OTHER): Payer: Medicare HMO | Admitting: Physical Therapy

## 2021-06-21 ENCOUNTER — Other Ambulatory Visit: Payer: Self-pay

## 2021-06-21 DIAGNOSIS — R2689 Other abnormalities of gait and mobility: Secondary | ICD-10-CM

## 2021-06-21 DIAGNOSIS — M6281 Muscle weakness (generalized): Secondary | ICD-10-CM

## 2021-06-21 DIAGNOSIS — R2681 Unsteadiness on feet: Secondary | ICD-10-CM

## 2021-06-21 NOTE — Therapy (Signed)
[place], [place], [address] Phone: [phone]   Fax:  [phone]  Physical Therapy Treatment  Patient Details  Name: [name] Pratt MRN: [id number] Date of Birth: Dec 09, 1940 Referring Provider (PT): [name], NP   Encounter Date: 06/21/2021   PT End of Session - 06/21/21 1143     Visit Number 15    Number of Visits 18    Date for PT Re-Evaluation 07/12/21    Authorization Type [name]    Authorization - Visit Number 15    Progress Note Due on Visit 20    PT Start Time 1100    PT Stop Time 1142    PT Time Calculation (min) 42 min    Equipment Utilized During Treatment Gait belt    Activity Tolerance Patient tolerated treatment well    Behavior During Therapy [facility] for tasks assessed/performed             Past Medical History:  Diagnosis Date   Aortic atherosclerosis ([place]) 03/05/2020   Arthritis    Cancer ([place])    skin cancer   Carotid stenosis    a. Carotid U/S 5/63: LICA < 14%, RICA 97-02%;  b.  Carotid U/S 6/37:  RICA 85-88%; LICA 5-02%; f/u 1 year   Chest pain     Myoview in 2008 was normal.  Echo 7/09: EF 60%, normal wall motion, mild LVH, mild LAE.   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    Past Surgical History:  Procedure Laterality  Date   ABDOMINAL HERNIA REPAIR  07/15/12   Dr [name]   ANTERIOR CERVICAL DECOMP/DISCECTOMY FUSION N/A 07/09/2020   Procedure: ANTERIOR CERVICAL DECOMPRESSION AND FUSION CERVICAL THREE-FOUR.;  Surgeon: [name], MD;  Location: [facility];  Service: Neurosurgery;  Laterality: N/A;  anterior   BROW LIFT  05/07/01   CYSTOSCOPY WITH URETEROSCOPY AND STENT PLACEMENT Right 01/28/2014   Procedure: CYSTOSCOPY WITH URETEROSCOPY, BASKET RETRIVAL AND  STENT PLACEMENT;  Surgeon: [name], MD;  Location: [facility];  Service: Urology;  Laterality: Right;   epidural injections     multiple procedures   ESOPHAGEAL DILATION  1993 and 1994   multiple times   EYE SURGERY Bilateral 03/25/10, 2012   cataract removal   HEMORRHOID SURGERY  2014   HIATAL HERNIA REPAIR  12/21/93   HOLMIUM LASER APPLICATION Right 7/74/1287   Procedure: HOLMIUM LASER APPLICATION;  Surgeon: [name], MD;  Location: [facility];  Service: Urology;  Laterality: Right;   INGUINAL HERNIA REPAIR Right 07/15/12   Dr [name], x2   JOINT REPLACEMENT  07/25/04   right knee   JOINT REPLACEMENT  07/13/10   left knee   [place]  LAPAROSCOPIC CHOLECYSTECTOMY  09/20/06   with intraoperative cholangiogram and right inguinal herniorrhaphy with mesh    LIGAMENT REPAIR Left 07/2013   shoulder   LITHOTRIPSY     x2   [place] FUSION/FORAMINOTOMY N/A 11/24/2020   Procedure: Posterior Cervical Laminectomy Cervical three-four, Cervical four-five with lateral mass fusion;  Surgeon: [name], MD;  Location: [facility];  Service: Neurosurgery;  Laterality: N/A;   REFRACTIVE SURGERY Bilateral    Right total hip replacement  4/14   SKIN CANCER DESTRUCTION     nose, ear   TONSILLECTOMY  as child   TOTAL HIP ARTHROPLASTY Left    URETHRAL DILATION  1991   [name]    There were no vitals filed for this visit.   Subjective Assessment - 06/21/21 1059     Subjective Pt reports he has been busy with yard work  and is having increased back and Lt hip pain. Pt is using his SPC for gait again    Patient Stated Goals "walking good and have good balance."    Currently in Pain? Yes    Pain Score 3     Pain Location Hip    Pain Orientation Left    Pain Descriptors / Indicators Aching;Sore                [facility] PT Assessment - 06/21/21 0001       Assessment   Medical Diagnosis gait disorder    Referring Provider (PT) [name], NP    Onset Date/Surgical Date 11/14/20    Hand Dominance Right      Strength   Right Hip Flexion 3/5   limited by pain                          [facility] Adult PT Treatment/Exercise - 06/21/21 0001       Knee/Hip Exercises: Stretches   Passive Hamstring Stretch Right;Left;2 reps;30 seconds    Passive Hamstring Stretch Limitations seated with hip hinge      Knee/Hip Exercises: Aerobic   Nustep L5 x 5 min for warm up      Knee/Hip Exercises: Standing   Other Standing Knee Exercises dead lift 5# KB to 12'' step    Other Standing Knee Exercises standing with 1 LE on 8'' step with UE lifts x 10 bilat with min A for balance      Knee/Hip Exercises: Seated   Sit to Sand 10 reps;without UE support      Knee/Hip Exercises: Supine   Straight Leg Raises Strengthening;Left;Right;2 sets;10 reps    Other Supine Knee/Hip Exercises supine march x 10 bilat      Knee/Hip Exercises: Sidelying   Hip ABduction Strengthening;Right;Left;2 sets;10 reps    Clams 2 x 10 bilat LE      Manual Therapy   Soft tissue mobilization STM bilat hamstrings and ITB to reduce spasticity and pain                          PT Long Term Goals - 05/31/21 1053       PT LONG TERM GOAL #1   Title The patient will be indep with HEP.    Time 6    Period Weeks    Status On-going    Target Date 07/12/21      PT LONG TERM GOAL #2   Title The patient will improve Merrilee Jansky  balance scale from 36/56 to 40/56    Time 6    Period Weeks    Status New     Target Date 07/12/21      PT LONG TERM GOAL #3   Title The patient will improve gait speed from 2.5 ft/sec to > or equal to 2.62 ft/sec to demo improved community moiblity.    Time 6    Period Weeks    Status New      PT LONG TERM GOAL #4   Title Pt will maintain TUG at 14.5 seconds to demo maintained mobility    Time 6    Period Weeks    Status New    Target Date 07/12/21                   Plan - 06/21/21 1145     Clinical Impression Statement Session modified due to pain with SLS on Lt and pain wiht increased Lt LE wt bearing. Pt requires frequent rests due to cramping in Lt hamstring which improved with manual STM. At end of session pt iwth improved gait and upright posture    PT Next Visit Plan progress hip strength, gait and balance    PT Home Exercise Plan Access Code: [email or web address]    Consulted and Agree with Plan of Care Patient             Patient will benefit from skilled therapeutic intervention in order to improve the following deficits and impairments:     Visit Diagnosis: Muscle weakness (generalized)  Unsteadiness on feet  Other abnormalities of gait and mobility     Problem List Patient Active Problem List   Diagnosis Date Noted   Nasal congestion 04/11/2021   C. 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[place]) 02/09/2021   Visual hallucinations 11/30/2020   Cervical myelopathy ([place]) 11/24/2020   Cystoid macular edema of both eyes 07/28/2020   Status post cervical spinal fusion 07/09/2020   History of total bilateral knee replacement 07/08/2020   Aortic atherosclerosis ([place]) 03/05/2020   Emphysema, unspecified ([place]) 03/05/2020   Heme positive stool 02/29/2020   Leg cramps  11/13/2019   Tibialis anterior tendon tear, nontraumatic 11/13/2019   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   CPAP (continuous positive airway pressure) dependence 03/25/2018   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   Acute idiopathic gout of hand 03/25/2018   Erectile dysfunction 06/20/2017   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 03/06/2017   Peripheral neuropathy 06/19/2016   Hearing loss 02/02/2016   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   Allergic rhinitis 02/02/2016   Atypical chest pain 11/24/2015   Preventative health care 11/24/2015   Onychomycosis 06/30/2015   Degenerative disc disease, lumbar 06/30/2015   Other allergic rhinitis 02/18/2015   Deviated nasal septum 02/18/2015   OSA on CPAP 02/18/2015   RLS (restless legs syndrome) 02/18/2015   GERD (gastroesophageal reflux disease) 09/18/2014   Hyperglycemia 03/03/2014   Sleep apnea with use of continuous positive airway pressure (CPAP) 07/15/2013   Carotid stenosis 02/24/2013   Nonspecific abnormal electrocardiogram (ECG) (EKG) 01/06/2013   DJD (degenerative joint disease) of hip 12/24/2012   Lower GI bleed 12/12/2012   Dyspnea 06/12/2011   Hypothyroidism 05/13/2009   HYPERCHOLESTEROLEMIA 05/13/2009  Hyperlipidemia 05/13/2009   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   Essential hypertension 05/13/2009   INGUINAL HERNIA 05/13/2009   HIATAL HERNIA 05/13/2009   NEPHROLITHIASIS 05/13/2009   ROSACEA 05/13/2009    Jamea Robicheaux, PT 06/21/2021, 11:47 AM  [facility] [place], [place], [address] Phone: [phone]   Fax:  [phone]  Name: [name] Pratt MRN: [id number] Date of Birth: 01-May-1941

## 2021-06-23 DIAGNOSIS — N3941 Urge incontinence: Secondary | ICD-10-CM | POA: Diagnosis not present

## 2021-06-23 DIAGNOSIS — N401 Enlarged prostate with lower urinary tract symptoms: Secondary | ICD-10-CM | POA: Diagnosis not present

## 2021-06-24 ENCOUNTER — Encounter (HOSPITAL_BASED_OUTPATIENT_CLINIC_OR_DEPARTMENT_OTHER): Payer: Self-pay

## 2021-06-24 ENCOUNTER — Other Ambulatory Visit: Payer: Self-pay

## 2021-06-24 ENCOUNTER — Emergency Department (HOSPITAL_BASED_OUTPATIENT_CLINIC_OR_DEPARTMENT_OTHER)
Admission: EM | Admit: 2021-06-24 | Discharge: 2021-06-24 | Disposition: A | Discharge status: Home / Self Care | Payer: Medicare HMO | Attending: Emergency Medicine | Admitting: Emergency Medicine

## 2021-06-24 DIAGNOSIS — Z87891 Personal history of nicotine dependence: Secondary | ICD-10-CM | POA: Diagnosis not present

## 2021-06-24 DIAGNOSIS — I10 Essential (primary) hypertension: Secondary | ICD-10-CM | POA: Insufficient documentation

## 2021-06-24 DIAGNOSIS — Z96653 Presence of artificial knee joint, bilateral: Secondary | ICD-10-CM | POA: Insufficient documentation

## 2021-06-24 DIAGNOSIS — Z79899 Other long term (current) drug therapy: Secondary | ICD-10-CM | POA: Insufficient documentation

## 2021-06-24 DIAGNOSIS — E039 Hypothyroidism, unspecified: Secondary | ICD-10-CM | POA: Diagnosis not present

## 2021-06-24 DIAGNOSIS — Z7982 Long term (current) use of aspirin: Secondary | ICD-10-CM | POA: Diagnosis not present

## 2021-06-24 DIAGNOSIS — J449 Chronic obstructive pulmonary disease, unspecified: Secondary | ICD-10-CM | POA: Diagnosis not present

## 2021-06-24 DIAGNOSIS — Z96642 Presence of left artificial hip joint: Secondary | ICD-10-CM | POA: Insufficient documentation

## 2021-06-24 DIAGNOSIS — Z85828 Personal history of other malignant neoplasm of skin: Secondary | ICD-10-CM | POA: Insufficient documentation

## 2021-06-24 LAB — CBC WITH DIFFERENTIAL/PLATELET
Abs Immature Granulocytes: 0.02 10*3/uL (ref 0.00–0.07)
Basophils Absolute: 0 10*3/uL (ref 0.0–0.1)
Basophils Relative: 1 %
Eosinophils Absolute: 0.2 10*3/uL (ref 0.0–0.5)
Eosinophils Relative: 3 %
HCT: 36.9 % — ABNORMAL LOW (ref 39.0–52.0)
Hemoglobin: 12.2 g/dL — ABNORMAL LOW (ref 13.0–17.0)
Immature Granulocytes: 0 %
Lymphocytes Relative: 15 %
Lymphs Abs: 1 10*3/uL (ref 0.7–4.0)
MCH: 32.9 pg (ref 26.0–34.0)
MCHC: 33.1 g/dL (ref 30.0–36.0)
MCV: 99.5 fL (ref 80.0–100.0)
Monocytes Absolute: 0.7 10*3/uL (ref 0.1–1.0)
Monocytes Relative: 10 %
Neutro Abs: 4.5 10*3/uL (ref 1.7–7.7)
Neutrophils Relative %: 71 %
Platelets: 320 10*3/uL (ref 150–400)
RBC: 3.71 MIL/uL — ABNORMAL LOW (ref 4.22–5.81)
RDW: 13.5 % (ref 11.5–15.5)
WBC: 6.4 10*3/uL (ref 4.0–10.5)
nRBC: 0 % (ref 0.0–0.2)

## 2021-06-24 LAB — COMPREHENSIVE METABOLIC PANEL
ALT: 24 U/L (ref 0–44)
AST: 24 U/L (ref 15–41)
Albumin: 3.7 g/dL (ref 3.5–5.0)
Alkaline Phosphatase: 66 U/L (ref 38–126)
Anion gap: 7 (ref 5–15)
BUN: 23 mg/dL (ref 8–23)
CO2: 26 mmol/L (ref 22–32)
Calcium: 9.2 mg/dL (ref 8.9–10.3)
Chloride: 103 mmol/L (ref 98–111)
Creatinine, Ser: 1.15 mg/dL (ref 0.61–1.24)
GFR, Estimated: >60 mL/min (ref >60)
Glucose, Bld: 101 mg/dL — ABNORMAL HIGH (ref 70–99)
Potassium: 3.7 mmol/L (ref 3.5–5.1)
Sodium: 136 mmol/L (ref 135–145)
Total Bilirubin: 0.6 mg/dL (ref 0.3–1.2)
Total Protein: 7.1 g/dL (ref 6.5–8.1)

## 2021-06-24 LAB — TROPONIN I (HIGH SENSITIVITY): Troponin I (High Sensitivity): 15 ng/L (ref <18)

## 2021-06-24 MED ORDER — AMLODIPINE BESYLATE 5 MG PO TABS: 10.0000 mg | ORAL_TABLET | Freq: Every day | ORAL | 1 refills | Status: DC

## 2021-06-24 NOTE — ED Provider Notes (Signed)
[facility] EMERGENCY DEPARTMENT Provider Note   CSN: [id number] Arrival date & time: 06/24/21  1607     History Chief Complaint  Patient presents with   Hypertension    [name] Pratt is a 80 y.o. male.  HPI     Woke up this AM, felt generally weak, mild headache, not bad, has felt this way before with waking up. Feels dizzy if end over and stand up, got better with medications.  Dizziness has improved since he was seen 10/4.  Constant numbness bilateral feet top, that's normal, no change. No new numbness/weakness. Denies NEW numbness, weakness, difficulty talking or walking, visual changes or facial droop.  Had 6 seconds of central chest pain.  Sharp for 6 seconds then went away. Happened at [place].  Concerned when he checked BP this AM it was 170s.  Taking three different blood pressure medications.   Past Medical History:  Diagnosis Date   Aortic atherosclerosis ([place]) 03/05/2020   Arthritis    Cancer ([place])    skin cancer   Carotid stenosis    a. Carotid U/S 3/71: LICA < 06%, RICA 26-94%;  b.  Carotid U/S 8/54:  RICA 62-70%; LICA 3-50%; f/u 1 year   Chest pain     Myoview in 2008 was normal.  Echo 7/09: EF 60%, normal wall motion, mild LVH, mild LAE.   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    Patient Active Problem List   Diagnosis Date  Noted   Nasal congestion 04/11/2021   C. 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[place]) 02/09/2021   Visual hallucinations 11/30/2020   Cervical myelopathy ([place]) 11/24/2020   Cystoid macular edema of both eyes 07/28/2020   Status post cervical spinal fusion 07/09/2020   History of total bilateral knee replacement 07/08/2020   Aortic atherosclerosis ([place]) 03/05/2020   Emphysema, unspecified ([place]) 03/05/2020   Heme positive stool 02/29/2020   Leg cramps 11/13/2019   Tibialis anterior tendon tear, nontraumatic 11/13/2019   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   CPAP (continuous positive airway pressure) dependence 03/25/2018   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   Acute idiopathic gout of hand 03/25/2018   Erectile dysfunction 06/20/2017   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 03/06/2017   Peripheral neuropathy 06/19/2016   Hearing loss 02/02/2016   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   Allergic rhinitis 02/02/2016   Atypical chest pain 11/24/2015   Preventative health care 11/24/2015   Onychomycosis 06/30/2015   Degenerative  disc disease, lumbar 06/30/2015   Other allergic rhinitis 02/18/2015   Deviated nasal septum 02/18/2015   OSA on CPAP 02/18/2015   RLS (restless legs syndrome) 02/18/2015   GERD (gastroesophageal reflux disease) 09/18/2014   Hyperglycemia 03/03/2014   Sleep apnea with use of continuous positive airway pressure (CPAP) 07/15/2013   Carotid stenosis 02/24/2013   Nonspecific abnormal electrocardiogram (ECG) (EKG) 01/06/2013   DJD (degenerative joint disease) of hip 12/24/2012   Lower GI bleed 12/12/2012   Dyspnea 06/12/2011   Hypothyroidism 05/13/2009    HYPERCHOLESTEROLEMIA 05/13/2009   Hyperlipidemia 05/13/2009   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   Essential hypertension 05/13/2009   INGUINAL HERNIA 05/13/2009   HIATAL HERNIA 05/13/2009   NEPHROLITHIASIS 05/13/2009   ROSACEA 05/13/2009    Past Surgical History:  Procedure Laterality Date   ABDOMINAL HERNIA REPAIR  07/15/12   Dr [name]   ANTERIOR CERVICAL DECOMP/DISCECTOMY FUSION N/A 07/09/2020   Procedure: ANTERIOR CERVICAL DECOMPRESSION AND FUSION CERVICAL THREE-FOUR.;  Surgeon: [name], MD;  Location: [facility];  Service: Neurosurgery;  Laterality: N/A;  anterior   BROW LIFT  05/07/01   CYSTOSCOPY WITH URETEROSCOPY AND STENT PLACEMENT Right 01/28/2014   Procedure: CYSTOSCOPY WITH URETEROSCOPY, BASKET RETRIVAL AND  STENT PLACEMENT;  Surgeon: [name], MD;  Location: [facility];  Service: Urology;  Laterality: Right;   epidural injections     multiple procedures   ESOPHAGEAL DILATION  1993 and 1994   multiple times   EYE SURGERY Bilateral 03/25/10, 2012   cataract removal   HEMORRHOID SURGERY  2014   HIATAL HERNIA REPAIR  12/21/93   HOLMIUM LASER APPLICATION Right 4/65/0354   Procedure: HOLMIUM LASER APPLICATION;  Surgeon: [name], MD;  Location: [facility];  Service: Urology;  Laterality: Right;   INGUINAL HERNIA REPAIR Right 07/15/12   Dr [name], x2   JOINT REPLACEMENT  07/25/04   right knee   JOINT REPLACEMENT  07/13/10   left knee   [place]  09/20/06   with intraoperative cholangiogram and right inguinal herniorrhaphy with mesh    LIGAMENT REPAIR Left 07/2013   shoulder   LITHOTRIPSY     x2   [place] FUSION/FORAMINOTOMY N/A 11/24/2020   Procedure: Posterior Cervical Laminectomy Cervical three-four, Cervical four-five with lateral mass fusion;  Surgeon: [name], MD;  Location: [facility];  Service: Neurosurgery;  Laterality: N/A;   REFRACTIVE SURGERY Bilateral    Right total hip replacement  4/14   SKIN CANCER  DESTRUCTION     nose, ear   TONSILLECTOMY  as child   TOTAL HIP ARTHROPLASTY Left    URETHRAL DILATION  1991   [name]       Family History  Problem Relation Age of Onset   Cancer Mother    Hypertension Other    Cancer Other    Osteoarthritis Other     Social History   Tobacco Use   Smoking status: Former    Years: 11.00    Types: Cigarettes    Quit date: 09/11/1978    Years since quitting: 42.8   Smokeless tobacco: Never  Vaping Use   Vaping Use: Never used  Substance Use Topics   Alcohol use: Not Currently    Alcohol/week: 0.0 standard drinks    Comment: rare   Drug use: No    Home Medications Prior to Admission medications   Medication Sig Start Date End Date Taking? Authorizing Provider  acetaminophen (TYLENOL)  650 MG CR tablet Take 650-1,300 mg by mouth See admin instructions. Take 1300 mg in the morning and 650 g at bedtime    [provider]  albuterol (VENTOLIN HFA) 108 (90 Base) MCG/ACT inhaler Inhale 2 puffs into the lungs every 6 (six) hours as needed for shortness of breath. 06/15/20   [name], NP  Alcohol Swabs (ALCOHOL PADS) 70 % PADS 1 packet by Does not apply route 2 (two) times daily. 11/22/20   [name], NP  allopurinol (ZYLOPRIM) 100 MG tablet TAKE 1 TABLET TWICE DAILY 10/30/20   [name], NP  amLODipine (NORVASC) 5 MG tablet Take 2 tablets (10 mg total) by mouth daily. 06/24/21   Gareth Morgan, MD  Artificial Tear Solution (SOOTHE XP) SOLN Place 1 drop into both eyes 2 (two) times daily as needed (Dry eyes).    [provider]  aspirin EC 81 MG tablet Take 81 mg by mouth every morning.    [provider]  blood glucose meter kit and supplies Dispense based on patient and insurance preference. Use up to four times daily as directed. (FOR ICD-10 E10.9, E11.9). 11/19/20   [name], NP  Cholecalciferol (VITAMIN D) 50 MCG (2000 UT) CAPS Take 2,000 Units by mouth daily.    [provider]  cholestyramine (QUESTRAN) 4 g packet Take 1 packet (4 g total) by mouth 2 (two) times daily as needed. 03/02/21   [name], NP  colchicine 0.6 MG tablet Take 1 tablet by mouth daily as needed (Gout). 12/04/05   [provider]  Cranberry 500 MG TABS Take 500 mg by mouth daily.     [provider]  cycloSPORINE (RESTASIS) 0.05 % ophthalmic emulsion Place 1 drop into both eyes 2 (two) times daily.    [provider]  diclofenac Sodium (VOLTAREN) 1 % GEL Apply 2 g topically daily as needed (Hand pain).    [provider]  gabapentin (NEURONTIN) 300 MG capsule Take 300 mg by mouth 2 (two) times daily as needed.    [provider]  glucose blood test strip TRUE Metrix Blood Glucose Test Strip, use as instructed 11/12/20   [name], NP  Homeopathic Products ([place]) FOAM Apply 1 application topically 2 (two) times daily as needed (Pain).    [provider]  hydrALAZINE (APRESOLINE) 50 MG tablet TAKE 1 TABLET THREE TIMES DAILY 03/28/21   [name], NP  Lancets MISC 1 each by Does not apply route 2 (two) times daily. TRUE matrix lancets 11/12/20   [name], NP  levothyroxine (SYNTHROID) 75 MCG tablet Take 2 tabs by mouth once daily 6 days a week and 1 tab by mouth once daily one day a week 03/29/21   [name], NP  losartan (COZAAR) 100 MG tablet Take 1 tablet (100 mg total) by mouth daily. 01/13/21   [name], NP  Magnesium 250 MG TABS Take by mouth.    [provider]  meclizine (ANTIVERT) 25 MG tablet Take 1 tablet (25 mg total) by mouth 3 (three) times daily as needed for dizziness. 06/14/21   Malvin Johns, MD  Menthol, Topical Analgesic, (ZIMS MAX-FREEZE EX) Apply 1 application topically daily as needed (pain).    [provider]  multivitamin [facility]) per tablet Take 1 tablet by mouth daily.    [provider]  nitroGLYCERIN (NITROSTAT)  0.4 MG SL tablet Place 1 tablet (0.4 mg total) under the tongue every 5 (five) minutes as needed for chest pain.  03/11/18   [name], MD  Omega-3 Fatty Acids (FISH OIL) 1200 MG CAPS Take 1,200 mg by mouth daily.    [provider]  potassium chloride (KLOR-CON) 10 MEQ tablet Take 1 tablet (10 mEq total) by mouth once for 1 dose. 06/15/21 06/15/21  Saguier, Percell Miller, PA-C  rosuvastatin (CRESTOR) 5 MG tablet TAKE 1 TABLET EVERY DAY 04/14/21   [name], NP  sodium chloride (OCEAN) 0.65 % nasal spray Place 1 spray into both nostrils at bedtime as needed for congestion.    [provider]    Allergies    Pravastatin, Sildenafil, Metoclopramide, Oxycodone, Atenolol, Elastic bandages & [zinc], and Metoclopramide hcl  Review of Systems   Review of Systems  Constitutional:  Positive for fatigue. Negative for fever.  HENT:  Negative for sore throat.   Eyes:  Negative for visual disturbance.  Respiratory:  Negative for cough and shortness of breath.   Cardiovascular:  Positive for chest pain (had 6 seconds of CP earlier today).  Gastrointestinal:  Negative for abdominal pain, nausea and vomiting.  Genitourinary:  Negative for difficulty urinating.  Musculoskeletal:  Negative for back pain and neck stiffness.  Skin:  Negative for rash.  Neurological:  Positive for light-headedness and headaches (mild). Negative for syncope.   Physical Exam Updated Vital Signs BP (!) 149/67   Pulse (!) 52   Temp 97.7 F (36.5 C) (Oral)   Resp 18   Ht '5\' 7"'  (1.702 m)   Wt 68 kg   SpO2 98%   BMI 23.49 kg/m   Physical Exam Vitals and nursing note reviewed.  Constitutional:      General: He is not in acute distress.    Appearance: Normal appearance. He is well-developed. He is not ill-appearing or diaphoretic.  HENT:     Head: Normocephalic and atraumatic.  Eyes:     General: No visual field deficit.    Extraocular Movements: Extraocular movements intact.      Conjunctiva/sclera: Conjunctivae normal.     Pupils: Pupils are equal, round, and reactive to light.  Cardiovascular:     Rate and Rhythm: Normal rate and regular rhythm.     Pulses: Normal pulses.     Heart sounds: Normal heart sounds. No murmur heard.   No friction rub. No gallop.  Pulmonary:     Effort: Pulmonary effort is normal. No respiratory distress.     Breath sounds: Normal breath sounds. No wheezing or rales.  Abdominal:     General: There is no distension.     Palpations: Abdomen is soft.     Tenderness: There is no abdominal tenderness. There is no guarding.  Musculoskeletal:        General: No swelling or tenderness.     Cervical back: Normal range of motion.  Skin:    General: Skin is warm and dry.     Findings: No erythema or rash.  Neurological:     General: No focal deficit present.     Mental Status: He is alert and oriented to person, place, and time.     GCS: GCS eye subscore is 4. GCS verbal subscore is 5. GCS motor subscore is 6.     Cranial Nerves: No cranial nerve deficit, dysarthria or facial asymmetry.     Motor: No tremor.     Coordination: Coordination normal. Finger-Nose-Finger Test normal.     Gait: Gait normal.     Comments: Reports right sided weakness leg unchanged Bilateral arm numbness unchanged from baseline  ED Results / Procedures / Treatments   Labs (all labs ordered are listed, but only abnormal results are displayed) Labs Reviewed  CBC WITH DIFFERENTIAL/PLATELET - Abnormal; Notable for the following components:      Result Value   RBC 3.71 (*)    Hemoglobin 12.2 (*)    HCT 36.9 (*)    All other components within normal limits  COMPREHENSIVE METABOLIC PANEL - Abnormal; Notable for the following components:   Glucose, Bld 101 (*)    All other components within normal limits  TROPONIN I (HIGH SENSITIVITY)    EKG EKG Interpretation  Date/Time:  Friday June 24 2021 09:05:24 EDT Ventricular Rate:  65 PR Interval:    QRS  Duration: 156 QT Interval:  454 QTC Calculation: 473 R Axis:   -43 Text Interpretation: Similar appearance to prior, suspect sinus rhythm RBBB and LAFB Probable left ventricular hypertrophy Lateral infarct, old No significant change since last tracing Confirmed by Gareth Morgan [phone]) on 06/24/2021 9:40:38 AM  Radiology No results found.  Procedures Procedures   Medications Ordered in ED Medications - No data to display  ED Course  I have reviewed the triage vital signs and the nursing notes.  Pertinent labs & imaging results that were available during my care of the patient were reviewed by me and considered in my medical decision making (see chart for details).    MDM Rules/Calculators/A&P                           80yo male with history above including hypertension, hyperlipidemia, hypothyroidism, carotid stenosis, OSA, presents with concern for elevated blood pressures.  Reports continuing of symptoms with fatigue, mild headache in AM, mild lightheadedness but that dizziness he ahd experienced at time of visit 10/4 has improved and improved with medications. Also had brief episode of chest pain--EKG without significant changes since previous, troponin negative nearly 6hr after symptoms started and given brief, atypical episode of chest pain with negative troponin doubt ACS.  Hx and exam not consistent with dissection, pneumonia, pneumothorax. Normal neurologic exam for pt (chronic abnormalities per pt), mild headache, doubt CVA, SAH. No sign of significant anemia or electrolyte abnormality.  Will increase amlodipine from 81m to 120mgiven high BP 170s at home and on arrival. Recommend PCP follow up.   Final Clinical Impression(s) / ED Diagnoses Final diagnoses:  Hypertension, unspecified type    Rx / DC Orders ED Discharge Orders          Ordered    amLODipine (NORVASC) 5 MG tablet  Daily        06/24/21 1111             ScGareth MorganMD 06/25/21 [phone]

## 2021-06-24 NOTE — ED Triage Notes (Addendum)
Patient seen here for high blood pressure and dizziness last week, told to return if his blood pressure was high again. Patient states he is "a little light headed when I move around".  Patient ambulatory with cane from waiting room, gait steady.

## 2021-06-24 NOTE — ED Notes (Signed)
Discharge instructions discussed with pt. Pt verbalized understanding. Pt stable and ambulatory.  °

## 2021-06-27 ENCOUNTER — Ambulatory Visit (INDEPENDENT_AMBULATORY_CARE_PROVIDER_SITE_OTHER): Payer: Medicare HMO | Admitting: Physical Therapy

## 2021-06-27 ENCOUNTER — Other Ambulatory Visit: Payer: Self-pay

## 2021-06-27 DIAGNOSIS — R2689 Other abnormalities of gait and mobility: Secondary | ICD-10-CM | POA: Diagnosis not present

## 2021-06-27 DIAGNOSIS — M6281 Muscle weakness (generalized): Secondary | ICD-10-CM | POA: Diagnosis not present

## 2021-06-27 DIAGNOSIS — R2681 Unsteadiness on feet: Secondary | ICD-10-CM

## 2021-06-27 NOTE — Therapy (Signed)
[facility] [place], [place], [address] Phone: [phone]   Fax:  [phone]  Physical Therapy Treatment  Patient Details  Name: [name] Pratt MRN: [id number] Date of Birth: 13-Mar-1941 Referring Provider (PT): [name], NP   Encounter Date: 06/27/2021   PT End of Session - 06/27/21 1031     Visit Number 16    Number of Visits 18    Date for PT Re-Evaluation 07/12/21    Authorization Type [name]    Authorization - Visit Number 16    Progress Note Due on Visit 20    PT Start Time 1020    PT Stop Time 1058    PT Time Calculation (min) 38 min    Equipment Utilized During Treatment Gait belt    Activity Tolerance Patient tolerated treatment well    Behavior During Therapy [facility] for tasks assessed/performed             Past Medical History:  Diagnosis Date   Aortic atherosclerosis ([place]) 03/05/2020   Arthritis    Cancer ([place])    skin cancer   Carotid stenosis    a. Carotid U/S 8/78: LICA < 67%, RICA 67-20%;  b.  Carotid U/S 9/47:  RICA 09-62%; LICA 8-36%; f/u 1 year   Chest pain     Myoview in 2008 was normal.  Echo 7/09: EF 60%, normal wall motion, mild LVH, mild LAE.   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    Past Surgical History:  Procedure Laterality  Date   ABDOMINAL HERNIA REPAIR  07/15/12   Dr [name]   ANTERIOR CERVICAL DECOMP/DISCECTOMY FUSION N/A 07/09/2020   Procedure: ANTERIOR CERVICAL DECOMPRESSION AND FUSION CERVICAL THREE-FOUR.;  Surgeon: [name], MD;  Location: [place];  Service: Neurosurgery;  Laterality: N/A;  anterior   BROW LIFT  05/07/01   CYSTOSCOPY WITH URETEROSCOPY AND STENT PLACEMENT Right 01/28/2014   Procedure: CYSTOSCOPY WITH URETEROSCOPY, BASKET RETRIVAL AND  STENT PLACEMENT;  Surgeon: [name], MD;  [facility];  Service: Urology;  Laterality: Right;   epidural injections     multiple procedures   ESOPHAGEAL DILATION  1993 and 1994   multiple times   EYE SURGERY Bilateral 03/25/10, 2012   cataract removal   HEMORRHOID SURGERY  2014   HIATAL HERNIA REPAIR  12/21/93   HOLMIUM LASER APPLICATION Right 03/09/4764   Procedure: HOLMIUM LASER APPLICATION;  Surgeon: [name], MD;  [facility];  Service: Urology;  Laterality: Right;   INGUINAL HERNIA REPAIR Right 07/15/12   Dr [name], x2   JOINT REPLACEMENT  07/25/04   right knee   JOINT REPLACEMENT  07/13/10   left knee   [place]  LAPAROSCOPIC CHOLECYSTECTOMY  09/20/06   with intraoperative cholangiogram and right inguinal herniorrhaphy with mesh    LIGAMENT REPAIR Left 07/2013   shoulder   LITHOTRIPSY     x2   [place] FUSION/FORAMINOTOMY N/A 11/24/2020   Procedure: Posterior Cervical Laminectomy Cervical three-four, Cervical four-five with lateral mass fusion;  Surgeon: [name], MD;  Location: [place];  Service: Neurosurgery;  Laterality: N/A;   REFRACTIVE SURGERY Bilateral    Right total hip replacement  4/14   SKIN CANCER DESTRUCTION     nose, ear   TONSILLECTOMY  as child   TOTAL HIP ARTHROPLASTY Left    URETHRAL DILATION  1991   [name]    There were no vitals filed for this visit.   Subjective Assessment - 06/27/21 1021     Subjective Pt reports soreness,  "like a bruise as soon  as I stand up" in Lt hip.  He complains of difficulty putting weight into Lt hip.  "I'm afraid it will give away".    Pertinent History spondylitic myelopathy, ACDF, HTN, COPD, cervical decompression, sleep apnea    Currently in Pain? Yes    Pain Score 2     Pain Location Hip    Pain Orientation Left    Pain Descriptors / Indicators Sore    Aggravating Factors  standing, walking, stairs    Pain Relieving Factors movement, after a while.                [facility] PT Assessment - 06/27/21 0001       Assessment   Medical Diagnosis gait disorder    Referring Provider (PT) [name], NP    Onset Date/Surgical Date 11/14/20    Hand Dominance Right      Ambulation/Gait   Gait velocity 2.51 ft/sec with cane      Timed Up and Go Test   TUG Comments 13.20 without cane              [facility] Adult PT Treatment/Exercise - 06/27/21 0001       Knee/Hip Exercises: Stretches   Active Hamstring Stretch Right;Left;2 reps;30 seconds   seated hip hinge, straight back   Gastroc Stretch Right;Left;1 rep;20 seconds      Knee/Hip Exercises: Aerobic   Nustep L4: 3 min      Knee/Hip Exercises: Standing   Hip Abduction Right;Left;1 set;5 reps   limited tolerance due to increased Lt hip pain in WB     Knee/Hip Exercises: Sidelying   Hip ABduction Strengthening;Right;Left;1 set;10 reps    Hip ABduction Limitations limited RLE range, therapist assist for form and height.    Clams 1 x 10 bilat LE             Balance Exercises - 06/27/21 0001       Balance Exercises: Standing   Standing Eyes Closed Narrow base of support (BOS);2 reps;10 secs   intermittent CGA to steady   Partial Tandem Stance Eyes open;Intermittent upper extremity support;2 reps;15 secs    Sidestepping 2 reps                     PT Long Term Goals - 06/27/21 1206       PT LONG TERM GOAL #1   Title The patient will be indep with HEP.    Time 6    Period Weeks    Status On-going      PT LONG  TERM GOAL #  2   Title The patient will improve Berg balance scale from 36/56 to 40/56    Time 6    Period Weeks    Status On-going      PT LONG TERM GOAL #3   Title The patient will improve gait speed from 2.5 ft/sec to > or equal to 2.62 ft/sec to demo improved community moiblity.    Baseline 2.51 ft/sec :06/27/21    Time 6    Period Weeks    Status On-going      PT LONG TERM GOAL #4   Title Pt will maintain TUG at 14.5 seconds to demo maintained mobility    Time 6    Period Weeks    Status On-going                   Plan - 06/27/21 1033     Clinical Impression Statement Pt's TUG time improved from last assessment in Sept.  Gait speed remains at 2.5 ft/sec.  Pt's legs fatigued with use of NuStep; LLE had difficulty clearing during swing through during gait after use of NuStep.  Dizziness reported after moving from Lt sidelying to supine; nastagmus noted - resolved after 2 min.  Pt reported increased pain in Lt hip with WB through LLE in standing, tolerated hip strengthening in sidelying better than standing today.  Goals are ongoing.    Comorbidities HTN, COPD, spondylitis myelopathy, falls    Rehab Potential Good    PT Frequency 1x / week    PT Duration 6 weeks    PT Next Visit Plan progress hip strength, gait and balance.  Assess dizziness.    PT Home Exercise Plan Access Code: [email or web address]    Consulted and Agree with Plan of Care Patient             Patient will benefit from skilled therapeutic intervention in order to improve the following deficits and impairments:  Pain, Decreased activity tolerance, Decreased balance, Decreased strength, Decreased range of motion, Impaired flexibility, Impaired tone, Increased fascial restricitons  Visit Diagnosis: Muscle weakness (generalized)  Unsteadiness on feet  Other abnormalities of gait and mobility     Problem List Patient Active Problem List   Diagnosis Date Noted   Nasal congestion 04/11/2021   C. difficile  colitis 04/11/2021   Diarrhea 03/02/2021   Lumbar radiculopathy 03/02/2021   Gait disorder 03/02/2021   Hip pain 03/02/2021   Incontinence of feces 03/02/2021   Situational depression 03/02/2021   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[place]) 02/09/2021   Visual hallucinations 11/30/2020   Cervical myelopathy ([place]) 11/24/2020   Cystoid macular edema of both eyes 07/28/2020   Status post cervical spinal fusion 07/09/2020   History of total bilateral knee replacement 07/08/2020   Aortic atherosclerosis ([place]) 03/05/2020   Emphysema, unspecified ([place]) 03/05/2020   Heme positive stool 02/29/2020   Leg cramps 11/13/2019   Tibialis anterior tendon tear, nontraumatic 11/13/2019   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   CPAP (continuous positive airway pressure) dependence 03/25/2018   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   Acute idiopathic gout of hand 03/25/2018   Erectile dysfunction 06/20/2017   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 03/06/2017   Peripheral neuropathy 06/19/2016   Hearing loss 02/02/2016   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   Allergic rhinitis 02/02/2016   Atypical chest pain 11/24/2015   Preventative health care 11/24/2015   Onychomycosis 06/30/2015   Degenerative disc disease,  lumbar 06/30/2015   Other allergic rhinitis 02/18/2015   Deviated nasal septum 02/18/2015   OSA on CPAP 02/18/2015   RLS (restless legs syndrome) 02/18/2015   GERD (gastroesophageal reflux disease) 09/18/2014   Hyperglycemia 03/03/2014   Sleep apnea with use of continuous positive airway pressure (CPAP) 07/15/2013   Carotid stenosis 02/24/2013   Nonspecific abnormal electrocardiogram (ECG) (EKG) 01/06/2013   DJD (degenerative joint disease) of hip 12/24/2012   Lower GI bleed 12/12/2012   Dyspnea 06/12/2011   Hypothyroidism 05/13/2009   HYPERCHOLESTEROLEMIA 05/13/2009   Hyperlipidemia  05/13/2009   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   Essential hypertension 05/13/2009   INGUINAL HERNIA 05/13/2009   HIATAL HERNIA 05/13/2009   NEPHROLITHIASIS 05/13/2009   ROSACEA 05/13/2009   Kerin Perna, PTA 06/27/21 12:09 PM  [facility] [place] [address] [place], [place], [address] Phone: [phone]   Fax:  [phone]  Name: [name] Pratt MRN: [id number] Date of Birth: [birth year]

## 2021-06-30 DIAGNOSIS — Z6825 Body mass index (BMI) 25.0-25.9, adult: Secondary | ICD-10-CM | POA: Diagnosis not present

## 2021-06-30 DIAGNOSIS — M7062 Trochanteric bursitis, left hip: Secondary | ICD-10-CM

## 2021-06-30 DIAGNOSIS — I10 Essential (primary) hypertension: Secondary | ICD-10-CM | POA: Diagnosis not present

## 2021-06-30 DIAGNOSIS — M461 Sacroiliitis, not elsewhere classified: Secondary | ICD-10-CM | POA: Diagnosis not present

## 2021-06-30 HISTORY — DX: Trochanteric bursitis, left hip: M70.62

## 2021-07-04 ENCOUNTER — Ambulatory Visit (INDEPENDENT_AMBULATORY_CARE_PROVIDER_SITE_OTHER): Payer: Medicare HMO | Admitting: Physical Therapy

## 2021-07-04 ENCOUNTER — Other Ambulatory Visit: Payer: Self-pay

## 2021-07-04 DIAGNOSIS — R2689 Other abnormalities of gait and mobility: Secondary | ICD-10-CM

## 2021-07-04 DIAGNOSIS — M6281 Muscle weakness (generalized): Secondary | ICD-10-CM | POA: Diagnosis not present

## 2021-07-04 DIAGNOSIS — H8111 Benign paroxysmal vertigo, right ear: Secondary | ICD-10-CM

## 2021-07-04 DIAGNOSIS — R2681 Unsteadiness on feet: Secondary | ICD-10-CM | POA: Diagnosis not present

## 2021-07-04 DIAGNOSIS — R42 Dizziness and giddiness: Secondary | ICD-10-CM | POA: Diagnosis not present

## 2021-07-04 NOTE — Therapy (Signed)
[place], [place], [address] Phone: [phone]   Fax:  [phone]  Physical Therapy Treatment and Re-Cert  Patient Details  Name: [name] Pratt MRN: [id number] Date of Birth: Jan 13, 1941 Referring Provider (PT): [name], NP   Encounter Date: 07/04/2021   PT End of Session - 07/04/21 1310     Visit Number 17    Number of Visits 24    Date for PT Re-Evaluation 08/15/21    Authorization Type [name]    Authorization - Visit Number 17    Progress Note Due on Visit 20    PT Start Time 1152    PT Stop Time 1230    PT Time Calculation (min) 38 min    Equipment Utilized During Treatment Gait belt    Activity Tolerance Patient tolerated treatment well    Behavior During Therapy [facility] for tasks assessed/performed             Past Medical History:  Diagnosis Date   Aortic atherosclerosis ([place]) 03/05/2020   Arthritis    Cancer ([place])    skin cancer   Carotid stenosis    a. Carotid U/S 9/83: LICA < 38%, RICA 25-05%;  b.  Carotid U/S 3/97:  RICA 67-34%; LICA 1-93%; f/u 1 year   Chest pain     Myoview in 2008 was normal.  Echo 7/09: EF 60%, normal wall motion, mild LVH, mild LAE.   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    Past Surgical History:   Procedure Laterality Date   ABDOMINAL HERNIA REPAIR  07/15/12   Dr [name]   ANTERIOR CERVICAL DECOMP/DISCECTOMY FUSION N/A 07/09/2020   Procedure: ANTERIOR CERVICAL DECOMPRESSION AND FUSION CERVICAL THREE-FOUR.;  Surgeon: [name], MD;  Location: [place];  Service: Neurosurgery;  Laterality: N/A;  anterior   BROW LIFT  05/07/01   CYSTOSCOPY WITH URETEROSCOPY AND STENT PLACEMENT Right 01/28/2014   Procedure: CYSTOSCOPY WITH URETEROSCOPY, BASKET RETRIVAL AND  STENT PLACEMENT;  Surgeon: [name], MD;  [facility];  Service: Urology;  Laterality: Right;   epidural injections     multiple procedures   ESOPHAGEAL DILATION  1993 and 1994   multiple times   EYE SURGERY Bilateral 03/25/10, 2012   cataract removal   HEMORRHOID SURGERY  2014   HIATAL HERNIA REPAIR  12/21/93   HOLMIUM LASER APPLICATION Right 7/90/2409   Procedure: HOLMIUM LASER APPLICATION;  Surgeon: [name], MD;  [facility];  Service: Urology;  Laterality: Right;   INGUINAL HERNIA REPAIR Right 07/15/12   Dr [name], x2   JOINT REPLACEMENT  07/25/04   right knee   JOINT REPLACEMENT  07/13/10   left knee   KNEE SURGERY  [place]  09/20/06   with intraoperative cholangiogram and right inguinal herniorrhaphy with mesh    LIGAMENT REPAIR Left 07/2013   shoulder   LITHOTRIPSY     x2   [place] FUSION/FORAMINOTOMY N/A 11/24/2020   Procedure: Posterior Cervical Laminectomy Cervical three-four, Cervical four-five with lateral mass fusion;  Surgeon: [name], MD;  Location: [place];  Service: Neurosurgery;  Laterality: N/A;   REFRACTIVE SURGERY Bilateral    Right total hip replacement  4/14   SKIN CANCER DESTRUCTION     nose, ear   TONSILLECTOMY  as child   TOTAL HIP ARTHROPLASTY Left    URETHRAL DILATION  1991   [name]    There were no vitals filed for this visit.            Vestibular Assessment - 07/04/21 0001       Positional  Testing   Dix-Hallpike Dix-Hallpike Right;Dix-Hallpike Left    Sidelying Test Sidelying Right;Sidelying Left      Dix-Hallpike Right   Dix-Hallpike Right Duration ~40 sec    Dix-Hallpike Right Symptoms Upbeat, right rotatory nystagmus      Dix-Hallpike Left   Dix-Hallpike Left Duration 0    Dix-Hallpike Left Symptoms No nystagmus      Sidelying Right   Sidelying Right Duration >1 min    Sidelying Right Symptoms Left nystagmus      Sidelying Left   Sidelying Left Duration 20    Sidelying Left Symptoms Left nystagmus   very small                              PT Education - 07/04/21 1300     Education Details Discussed BPPV. Provided education and POC. Discussed maneuvers to fix BPPV but reiterated that he is not yet to do them by himself at home.    Person(s) Educated Patient    Methods Explanation;Demonstration;Verbal cues;Handout    Comprehension Verbalized understanding;Returned demonstration;Verbal cues required;Tactile cues required                 PT Long Term Goals - 07/04/21 1306       PT LONG TERM GOAL #1   Title The patient will be indep with HEP.    Time 6    Period Weeks    Status On-going    Target Date 08/15/21      PT LONG TERM GOAL #2   Title The patient will improve Berg balance scale from 36/56 to 40/56    Time 6    Period Weeks    Status On-going    Target Date 08/15/21      PT LONG TERM GOAL #3   Title The patient will improve gait speed from 2.5 ft/sec to > or equal to 2.62 ft/sec to demo improved community moiblity.    Baseline 2.51 ft/sec :06/27/21    Time 6    Period Weeks    Status On-going    Target Date 08/15/21      PT LONG TERM GOAL #4   Title Pt will maintain TUG at 14.5 seconds to demo maintained mobility    Time 6    Period Weeks    Status Partially Met      PT LONG TERM GOAL #5   Title Pt will demo no s/s of BPPV in all positions of canalith testing    Time 6  Period Weeks    Status New     Target Date 08/15/21                   Plan - 07/04/21 1302     Clinical Impression Statement Treatment focused on checking pt's dizziness as this may also be contributing to his decreased balance. Pt found to be (+) for R horizontal and posterior canal BPPV. At end of session appeared to migrate to L horizontal canal which PT also addressed. Performed Epley maneuver and Gufoni maneuver accordingly. Pt has not yet met his LTGs -- he will benefit from continued therapy to address his strength, balance, and mobility deficits.    Personal Factors and Comorbidities Comorbidity 3+;Time since onset of injury/illness/exacerbation    Comorbidities HTN, COPD, spondylitis myelopathy, falls    Examination-Activity Limitations Squat;Stairs;Locomotion Level    Examination-Participation Restrictions Yard Work;Community Activity    Rehab Potential Good    PT Frequency 1x / week    PT Duration 6 weeks    PT Treatment/Interventions ADLs/Self Care Home Management;Patient/family education;Balance training;Neuromuscular re-education;Gait training;Therapeutic activities;Therapeutic exercise;Functional mobility training;Taping;Manual techniques;Vestibular    PT Next Visit Plan progress hip strength, gait and balance. Recheck canals for BPPV -- treat accordingly. Check LTGs.    PT Home Exercise Plan Access Code: [email or web address]    Consulted and Agree with Plan of Care Patient             Patient will benefit from skilled therapeutic intervention in order to improve the following deficits and impairments:  Pain, Decreased activity tolerance, Decreased balance, Decreased strength, Decreased range of motion, Impaired flexibility, Impaired tone, Increased fascial restricitons  Visit Diagnosis: Muscle weakness (generalized)  Unsteadiness on feet  Other abnormalities of gait and mobility  BPPV (benign paroxysmal positional vertigo), right  Dizziness and giddiness     Problem List Patient Active  Problem List   Diagnosis Date Noted   Nasal congestion 04/11/2021   C. 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[place]) 02/09/2021   Visual hallucinations 11/30/2020   Cervical myelopathy ([place]) 11/24/2020   Cystoid macular edema of both eyes 07/28/2020   Status post cervical spinal fusion 07/09/2020   History of total bilateral knee replacement 07/08/2020   Aortic atherosclerosis ([place]) 03/05/2020   Emphysema, unspecified ([place]) 03/05/2020   Heme positive stool 02/29/2020   Leg cramps 11/13/2019   Tibialis anterior tendon tear, nontraumatic 11/13/2019   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   CPAP (continuous positive airway pressure) dependence 03/25/2018   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   Acute idiopathic gout of hand 03/25/2018   Erectile dysfunction 06/20/2017   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 03/06/2017   Peripheral neuropathy 06/19/2016   Hearing loss 02/02/2016   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   Allergic rhinitis 02/02/2016   Atypical chest pain 11/24/2015   Preventative health care 11/24/2015   Onychomycosis 06/30/2015   Degenerative disc disease, lumbar 06/30/2015   Other allergic rhinitis 02/18/2015   Deviated nasal septum 02/18/2015   OSA on CPAP 02/18/2015   RLS (restless legs syndrome) 02/18/2015   GERD (gastroesophageal reflux disease) 09/18/2014   Hyperglycemia 03/03/2014   Sleep apnea with use of continuous positive airway pressure (CPAP) 07/15/2013   Carotid stenosis 02/24/2013  Nonspecific abnormal electrocardiogram (ECG) (EKG) 01/06/2013   DJD (degenerative joint disease) of hip 12/24/2012   Lower GI bleed 12/12/2012   Dyspnea 06/12/2011    Hypothyroidism 05/13/2009   HYPERCHOLESTEROLEMIA 05/13/2009   Hyperlipidemia 05/13/2009   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   Essential hypertension 05/13/2009   INGUINAL HERNIA 05/13/2009   HIATAL HERNIA 05/13/2009   NEPHROLITHIASIS 05/13/2009   ROSACEA 05/13/2009    Alaysiah Browder [address] [place], PT 07/04/2021, 1:13 PM  [facility] [place], [place], [address] Phone: [phone]   Fax:  [phone]  Name: [name] Pratt MRN: [id number] Date of Birth: [birth year]

## 2021-07-05 ENCOUNTER — Other Ambulatory Visit: Payer: Self-pay | Admitting: Family

## 2021-07-05 ENCOUNTER — Other Ambulatory Visit: Payer: Self-pay

## 2021-07-08 ENCOUNTER — Ambulatory Visit (INDEPENDENT_AMBULATORY_CARE_PROVIDER_SITE_OTHER): Payer: Medicare HMO | Admitting: Family

## 2021-07-08 ENCOUNTER — Other Ambulatory Visit: Payer: Self-pay

## 2021-07-08 VITALS — BP 158/62 | HR 64 | Temp 98.9°F | Resp 16 | Wt 159.0 lb

## 2021-07-08 DIAGNOSIS — I10 Essential (primary) hypertension: Secondary | ICD-10-CM | POA: Diagnosis not present

## 2021-07-08 DIAGNOSIS — E876 Hypokalemia: Secondary | ICD-10-CM

## 2021-07-08 DIAGNOSIS — A0472 Enterocolitis due to Clostridium difficile, not specified as recurrent: Secondary | ICD-10-CM | POA: Diagnosis not present

## 2021-07-08 DIAGNOSIS — I700 Atherosclerosis of aorta: Secondary | ICD-10-CM

## 2021-07-08 DIAGNOSIS — R42 Dizziness and giddiness: Secondary | ICD-10-CM

## 2021-07-08 DIAGNOSIS — E782 Mixed hyperlipidemia: Secondary | ICD-10-CM

## 2021-07-08 HISTORY — DX: Dizziness and giddiness: R42

## 2021-07-08 HISTORY — DX: Hypokalemia: E87.6

## 2021-07-08 LAB — BASIC METABOLIC PANEL
BUN: 33 mg/dL — ABNORMAL HIGH (ref 6–23)
CO2: 31 mEq/L (ref 19–32)
Calcium: 9.3 mg/dL (ref 8.4–10.5)
Chloride: 103 mEq/L (ref 96–112)
Creatinine, Ser: 1.43 mg/dL (ref 0.40–1.50)
GFR: 45.99 mL/min — ABNORMAL LOW (ref >60.00)
Glucose, Bld: 80 mg/dL (ref 70–99)
Potassium: 4.1 mEq/L (ref 3.5–5.1)
Sodium: 142 mEq/L (ref 135–145)

## 2021-07-08 MED ORDER — AMLODIPINE BESYLATE 10 MG PO TABS: 10.0000 mg | ORAL_TABLET | Freq: Every day | ORAL | 1 refills | Status: DC

## 2021-07-08 NOTE — Assessment & Plan Note (Signed)
>>  ASSESSMENT AND PLAN FOR BENIGN ESSENTIAL HYPERTENSION WRITTEN ON 07/08/2021  9:39 AM BY O'SULLIVAN, Dalya Maselli, NP  BP Readings from Last 3 Encounters:  07/08/21 (!) 158/62  06/24/21 (!) 149/67  06/15/21 (!) 145/60   BP is still a bit elevated today.  I have asked him to check BP once daily for 1 week at home and then call us with his readings. Will not make any medication adjustments at this time.

## 2021-07-08 NOTE — Assessment & Plan Note (Signed)
Stable following vestibular rehab treatments.

## 2021-07-08 NOTE — Assessment & Plan Note (Signed)
Stable/improved clinically. This is being managed by GI.

## 2021-07-08 NOTE — Progress Notes (Signed)
Subjective:   By signing my name below, I, Lyric Barr-McArthur, attest that this documentation has been prepared under the direction and in the presence of [name], NP, 07/08/2021     Patient ID: [name] Pratt, male    DOB: 07-Sep-1941, 80 y.o.   MRN: [id number]  Chief Complaint  Patient presents with   Follow-up    ED follow up, hypertension    HPI Patient is in today for an office visit.  Blood pressure: He was recently admitted into the ED due to his consistently high blood pressure. His blood pressure is high in the office again today. He has been recommended to take his blood pressure once a day from now on. He does note that with exertion his blood pressure will spike but lowers with rest. His blood pressure was manually taken in the office at 158/62.  EDP increased his amlodipine from 52m to 17m  BP Readings from Last 3 Encounters:  07/08/21 (!) 158/62  06/24/21 (!) 149/67  06/15/21 (!) 145/60    Vertigo: He has been experiencing vertigo lately but notes that it has improved immensely since his last visit.  Physical therapy: Pt had vestibular rehab treatments which were helpful.  His physical therapist recorded his blood pressure and it was recorded at 185/35 then checked again and it was recorded at 169/82. Urinary: He notes that he has still been urinating frequently. He follows with urology for this.  C Diff: Patient was treated with vancomycin and then metronidazole/probiotics.  . Last GI visit was 04/28/21. He notes he has been passing a normal stool instead of diarrhea.   Health Maintenance Due  Topic Date Due   COVID-19 Vaccine (4 - Booster for Pfizer series) 08/10/2020   TETANUS/TDAP  12/26/2020    Past Medical History:  Diagnosis Date   Aortic atherosclerosis ([facility]/25/2021   Arthritis    Cancer ([facility]   skin cancer   Carotid stenosis    a. Carotid U/S 5/4/58LICA < 5009%RICA 5098-33% b.  Carotid U/S 01/17/24 RICA 4005-39%LICA 0-7-67%f/u 1 year    Chest pain     Myoview in 2008 was normal.  Echo 7/09: EF 60%, normal wall motion, mild LVH, mild LAE.   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 GaDeloria Lair  Past Surgical History:  Procedure Laterality Date   ABDOMINAL HERNIA REPAIR  07/15/12   Dr ReArvin Collard ANTERIOR CERVICAL DECOMP/DISCECTOMY FUSION N/A 07/09/2020   Procedure: ANTERIOR CERVICAL DECOMPRESSION AND FUSION CERVICAL THREE-FOUR.;  Surgeon: CrKary KosMD;  [facility] Service: Neurosurgery;  Laterality: N/A;  anterior   BROW LIFT  05/07/01   CYSTOSCOPY WITH URETEROSCOPY AND STENT PLACEMENT Right 01/28/2014   Procedure:  CYSTOSCOPY WITH URETEROSCOPY, BASKET RETRIVAL AND  STENT PLACEMENT;  Surgeon: [name], MD;  [facility];  Service: Urology;  Laterality: Right;   epidural injections     multiple procedures   ESOPHAGEAL DILATION  1993 and 1994   multiple times   EYE SURGERY Bilateral 03/25/10, 2012   cataract removal   HEMORRHOID SURGERY  2014   HIATAL HERNIA REPAIR  12/21/93   HOLMIUM LASER APPLICATION Right 09/22/1622   Procedure: HOLMIUM LASER APPLICATION;  Surgeon: [name], MD;  [facility];  Service: Urology;  Laterality: Right;   INGUINAL HERNIA REPAIR Right 07/15/12   Dr [name], x2   JOINT REPLACEMENT  07/25/04   right knee   JOINT REPLACEMENT  07/13/10   left knee   [place]  09/20/06   with intraoperative cholangiogram and right inguinal herniorrhaphy with mesh    LIGAMENT REPAIR Left 07/2013   shoulder   LITHOTRIPSY     x2   [place] FUSION/FORAMINOTOMY N/A 11/24/2020   Procedure: Posterior Cervical Laminectomy Cervical three-four, Cervical four-five with lateral mass fusion;  Surgeon: [name], MD;  Location: [facility];  Service: Neurosurgery;  Laterality: N/A;   REFRACTIVE SURGERY Bilateral    Right total hip replacement  4/14   SKIN CANCER DESTRUCTION     nose, ear   TONSILLECTOMY  as child   TOTAL HIP ARTHROPLASTY Left    URETHRAL DILATION  1991   [name]    Family History  Problem Relation Age of Onset   Cancer Mother    Hypertension Other    Cancer Other    Osteoarthritis Other     Social History   Socioeconomic History   Marital status: Married    Spouse name: Not on file   Number of children: 1   Years of education: Not on file   Highest education level: Not on file  Occupational History   Occupation: firefighter    Comment: paints on the side  Tobacco Use   Smoking status: Former    Years: 11.00    Types: Cigarettes    Quit date: 09/11/1978    Years since quitting: 42.8   Smokeless tobacco: Never  Vaping Use   Vaping Use: Never used  Substance and Sexual Activity   Alcohol use: Not Currently    Alcohol/week: 0.0 standard drinks    Comment: rare   Drug use: No   Sexual activity: Not on file  Other Topics Concern   Not on file  Social History Narrative   Lives with his wife   He has one son- lives locally.   2 grandchildren   Has worked for Research [employer], trade [employer]   Enjoys wood working   Completed HS.  [employer]   Social Determinants of Radio broadcast assistant Strain: Not on file  Food Insecurity: Not on file  Transportation Needs: Not on file  Physical Activity: Not on file  Stress: Not on file  Social Connections: Not on file   Intimate Partner Violence: Not on file    Outpatient Medications Prior to Visit  Medication Sig Dispense Refill   acetaminophen (TYLENOL) 650 MG CR tablet Take 650-1,300 mg by mouth See admin instructions. Take 1300 mg in the morning and 650 g at bedtime     albuterol (VENTOLIN HFA) 108 (90 Base) MCG/ACT inhaler Inhale 2 puffs into the lungs every 6 (six) hours as needed  for shortness of breath. 1 each 3   Alcohol Swabs (ALCOHOL PADS) 70 % PADS 1 packet by Does not apply route 2 (two) times daily. 100 each 1   allopurinol (ZYLOPRIM) 100 MG tablet TAKE 1 TABLET TWICE DAILY 180 tablet 1   Artificial Tear Solution (SOOTHE XP) SOLN Place 1 drop into both eyes 2 (two) times daily as needed (Dry eyes).     aspirin EC 81 MG tablet Take 81 mg by mouth every morning.     blood glucose meter kit and supplies Dispense based on patient and insurance preference. Use up to four times daily as directed. (FOR ICD-10 E10.9, E11.9). 1 each 0   Cholecalciferol (VITAMIN D) 50 MCG (2000 UT) CAPS Take 2,000 Units by mouth daily.     cholestyramine (QUESTRAN) 4 g packet Take 1 packet (4 g total) by mouth 2 (two) times daily as needed. 60 each 2   colchicine 0.6 MG tablet Take 1 tablet by mouth daily as needed (Gout).     Cranberry 500 MG TABS Take 500 mg by mouth daily.      cycloSPORINE (RESTASIS) 0.05 % ophthalmic emulsion Place 1 drop into both eyes 2 (two) times daily.     diclofenac Sodium (VOLTAREN) 1 % GEL Apply 2 g topically daily as needed (Hand pain).     gabapentin (NEURONTIN) 300 MG capsule Take 300 mg by mouth 2 (two) times daily as needed.     glucose blood test strip TRUE Metrix Blood Glucose Test Strip, use as instructed 100 each 12   Homeopathic Products (THERAWORX RELIEF) FOAM Apply 1 application topically 2 (two) times daily as needed (Pain).     hydrALAZINE (APRESOLINE) 50 MG tablet TAKE 1 TABLET THREE TIMES DAILY 270 tablet 2   Lancets MISC 1 each by Does not apply route 2 (two) times daily.  TRUE matrix lancets 100 each 3   levothyroxine (SYNTHROID) 75 MCG tablet TAKE 2 TABS BY MOUTH ONCE DAILY 6 DAYS A WEEK AND 1 TAB BY MOUTH ONCE DAILY ONE DAY A WEEK 167 tablet o   losartan (COZAAR) 100 MG tablet Take 1 tablet (100 mg total) by mouth daily. 90 tablet 1   Magnesium 250 MG TABS Take by mouth.     meclizine (ANTIVERT) 25 MG tablet Take 1 tablet (25 mg total) by mouth 3 (three) times daily as needed for dizziness. 20 tablet 0   Menthol, Topical Analgesic, (ZIMS MAX-FREEZE EX) Apply 1 application topically daily as needed (pain).     multivitamin (THERAGRAN) per tablet Take 1 tablet by mouth daily.     nitroGLYCERIN (NITROSTAT) 0.4 MG SL tablet Place 1 tablet (0.4 mg total) under the tongue every 5 (five) minutes as needed for chest pain. 25 tablet 3   Omega-3 Fatty Acids (FISH OIL) 1200 MG CAPS Take 1,200 mg by mouth daily.     rosuvastatin (CRESTOR) 5 MG tablet TAKE 1 TABLET EVERY DAY 90 tablet 0   sodium chloride (OCEAN) 0.65 % nasal spray Place 1 spray into both nostrils at bedtime as needed for congestion.     amLODipine (NORVASC) 5 MG tablet Take 2 tablets (10 mg total) by mouth daily. 90 tablet 1   potassium chloride (KLOR-CON) 10 MEQ tablet Take 1 tablet (10 mEq total) by mouth once for 1 dose. 4 tablet 0   No facility-administered medications prior to visit.    Allergies  Allergen Reactions   Pravastatin Other (See Comments)    Muscle cramps   Sildenafil  Other (See Comments)    Tachycardia     Metoclopramide    Oxycodone     hallucinations   Atenolol Other (See Comments)    bradycardia   Elastic Bandages & [Zinc] Other (See Comments)    Teflon bandages - Irritation   Metoclopramide Hcl Anxiety    Review of Systems  Musculoskeletal:  Positive for back pain, joint pain and myalgias.       (+) pain and tightness in right leg, hips and lower back      Objective:    Physical Exam Constitutional:      General: He is not in acute distress.    Appearance:  Normal appearance. He is not ill-appearing.  HENT:     Head: Normocephalic and atraumatic.     Right Ear: Tympanic membrane, ear canal and external ear normal.     Left Ear: Tympanic membrane, ear canal and external ear normal.  Eyes:     Extraocular Movements: Extraocular movements intact.     Pupils: Pupils are equal, round, and reactive to light.  Cardiovascular:     Rate and Rhythm: Normal rate and regular rhythm.     Pulses:          Radial pulses are 2+ on the right side and 2+ on the left side.     Heart sounds: Normal heart sounds. No murmur heard.   No gallop.  Pulmonary:     Effort: Pulmonary effort is normal. No respiratory distress.     Breath sounds: Normal breath sounds. No wheezing or rales.  Lymphadenopathy:     Cervical: No cervical adenopathy.  Skin:    General: Skin is warm and dry.  Neurological:     Mental Status: He is alert and oriented to person, place, and time.  Psychiatric:        Behavior: Behavior normal.        Judgment: Judgment normal.    BP (!) 158/62   Pulse 64   Temp 98.9 F (37.2 C) (Oral)   Resp 16   Wt 159 lb (72.1 kg)   SpO2 100%   BMI 24.90 kg/m  Wt Readings from Last 3 Encounters:  07/08/21 159 lb (72.1 kg)  06/24/21 150 lb (68 kg)  06/15/21 156 lb 3.2 oz (70.9 kg)       Assessment & Plan:   Problem List Items Addressed This Visit       Unprioritized   Vertigo    Stable following vestibular rehab treatments.       Hypokalemia - Primary   Relevant Orders   Basic metabolic panel   Essential hypertension    BP Readings from Last 3 Encounters:  07/08/21 (!) 158/62  06/24/21 (!) 149/67  06/15/21 (!) 145/60  BP is still a bit elevated today.  I have asked him to check BP once daily for 1 week at home and then call us with his readings. Will not make any medication adjustments at this time.      Relevant Medications   amLODipine (NORVASC) 10 MG tablet   C. difficile colitis    Stable/improved clinically. This is  being managed by GI.       Meds ordered this encounter  Medications   amLODipine (NORVASC) 10 MG tablet    Sig: Take 1 tablet (10 mg total) by mouth daily.    Dispense:  90 tablet    Refill:  1    Order Specific Question:   Supervising Provider  Answer:   Penni Homans A [[id number]]    I, [name], NP, personally preformed the services described in this documentation.  All medical record entries made by the scribe were at my direction and in my presence.  I have reviewed the chart and discharge instructions (if applicable) and agree that the record reflects my personal performance and is accurate and complete. 07/08/2021  I,Lyric Barr-McArthur,acting as a Education administrator for Nance Pear, NP.,have documented all relevant documentation on the behalf of Nance Pear, NP,as directed by  Nance Pear, NP while in the presence of Nance Pear, NP.  Nance Pear, NP

## 2021-07-08 NOTE — Assessment & Plan Note (Addendum)
BP Readings from Last 3 Encounters:  07/08/21 (!) 158/62  06/24/21 (!) 149/67  06/15/21 (!) 145/60   BP is still a bit elevated today.  I have asked him to check BP once daily for 1 week at home and then call us with his readings. Will not make any medication adjustments at this time.

## 2021-07-08 NOTE — Patient Instructions (Signed)
Please complete lab work prior to leaving. Check blood pressure once daily for 1 week and then contact me with your readings.

## 2021-07-11 ENCOUNTER — Ambulatory Visit (INDEPENDENT_AMBULATORY_CARE_PROVIDER_SITE_OTHER): Payer: Medicare HMO | Admitting: Physical Therapy

## 2021-07-11 ENCOUNTER — Other Ambulatory Visit: Payer: Self-pay

## 2021-07-11 DIAGNOSIS — R42 Dizziness and giddiness: Secondary | ICD-10-CM | POA: Diagnosis not present

## 2021-07-11 DIAGNOSIS — M6281 Muscle weakness (generalized): Secondary | ICD-10-CM | POA: Diagnosis not present

## 2021-07-11 DIAGNOSIS — H8111 Benign paroxysmal vertigo, right ear: Secondary | ICD-10-CM | POA: Diagnosis not present

## 2021-07-11 DIAGNOSIS — R2689 Other abnormalities of gait and mobility: Secondary | ICD-10-CM

## 2021-07-11 DIAGNOSIS — R2681 Unsteadiness on feet: Secondary | ICD-10-CM

## 2021-07-11 NOTE — Therapy (Signed)
[place], [place], [address] Phone: [phone]   Fax:  [phone]  Physical Therapy Treatment  Patient Details  Name: [name] Pratt MRN: [id number] Date of Birth: Oct 06, 1940 Referring Provider (PT): [name], NP   Encounter Date: 07/11/2021   PT End of Session - 07/11/21 1225     Visit Number 18    Number of Visits 24    Date for PT Re-Evaluation 08/15/21    Authorization Type [name]    Authorization - Visit Number 18    Progress Note Due on Visit 20    PT Start Time 4742    PT Stop Time 1230    PT Time Calculation (min) 45 min    Equipment Utilized During Treatment Gait belt    Activity Tolerance Patient tolerated treatment well    Behavior During Therapy [facility] for tasks assessed/performed             Past Medical History:  Diagnosis Date   Aortic atherosclerosis ([place]) 03/05/2020   Arthritis    Cancer ([place])    skin cancer   Carotid stenosis    a. Carotid U/S 5/95: LICA < 63%, RICA 87-56%;  b.  Carotid U/S 4/33:  RICA 29-51%; LICA 8-84%; f/u 1 year   Chest pain     Myoview in 2008 was normal.  Echo 7/09: EF 60%, normal wall motion, mild LVH, mild LAE.   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    Past Surgical History:  Procedure Laterality  Date   ABDOMINAL HERNIA REPAIR  07/15/12   Dr [name]   ANTERIOR CERVICAL DECOMP/DISCECTOMY FUSION N/A 07/09/2020   Procedure: ANTERIOR CERVICAL DECOMPRESSION AND FUSION CERVICAL THREE-FOUR.;  Surgeon: [name], MD;  Location: [facility];  Service: Neurosurgery;  Laterality: N/A;  anterior   BROW LIFT  05/07/01   CYSTOSCOPY WITH URETEROSCOPY AND STENT PLACEMENT Right 01/28/2014   Procedure: CYSTOSCOPY WITH URETEROSCOPY, BASKET RETRIVAL AND  STENT PLACEMENT;  Surgeon: [name], MD;  [facility];  Service: Urology;  Laterality: Right;   epidural injections     multiple procedures   ESOPHAGEAL DILATION  1993 and 1994   multiple times   EYE SURGERY Bilateral 03/25/10, 2012   cataract removal   HEMORRHOID SURGERY  2014   HIATAL HERNIA REPAIR  12/21/93   HOLMIUM LASER APPLICATION Right 1/66/0630   Procedure: HOLMIUM LASER APPLICATION;  Surgeon: [name], MD;  [facility];  Service: Urology;  Laterality: Right;   INGUINAL HERNIA REPAIR Right 07/15/12   Dr [name], x2   JOINT REPLACEMENT  07/25/04   right knee   JOINT REPLACEMENT  07/13/10   left knee   [place]  LAPAROSCOPIC CHOLECYSTECTOMY  09/20/06   with intraoperative cholangiogram and right inguinal herniorrhaphy with mesh    LIGAMENT REPAIR Left 07/2013   shoulder   LITHOTRIPSY     x2   [place] FUSION/FORAMINOTOMY N/A 11/24/2020   Procedure: Posterior Cervical Laminectomy Cervical three-four, Cervical four-five with lateral mass fusion;  Surgeon: [name], MD;  Location: [facility];  Service: Neurosurgery;  Laterality: N/A;   REFRACTIVE SURGERY Bilateral    Right total hip replacement  4/14   SKIN CANCER DESTRUCTION     nose, ear   TONSILLECTOMY  as child   TOTAL HIP ARTHROPLASTY Left    URETHRAL DILATION  1991   [name]    There were no vitals filed for this visit.   Subjective Assessment - 07/11/21 1153     Subjective Pt notes he felt like he got hit by a truck  after last session but went away after a day.    Pertinent History spondylitic myelopathy, ACDF, HTN, COPD, cervical decompression, sleep apnea                     Vestibular Assessment - 07/11/21 0001       Dix-Hallpike Right   Dix-Hallpike Right Duration 0      Dix-Hallpike Left   Dix-Hallpike Left Duration 0      Sidelying Right   Sidelying Right Duration 0      Sidelying Left   Sidelying Left Duration 0                      [facility] Adult PT Treatment/Exercise - 07/11/21 0001       Self-Care   Other Self-Care Comments  Discussed brandt-daroff exercises for his bppv      Knee/Hip Exercises: Supine   Other Supine Knee/Hip Exercises hip flexion with red tband 2x10      Knee/Hip Exercises: Sidelying   Clams 2x10 bilat    Other Sidelying Knee/Hip Exercises hip extensions 2x10 on L      Manual Therapy   Soft tissue mobilization Glute med, piriformis                          PT Long Term Goals - 07/04/21 1306       PT LONG TERM GOAL #1   Title The patient will be indep with HEP.    Time 6    Period Weeks    Status On-going    Target Date 08/15/21      PT LONG TERM GOAL #2   Title The patient will improve Berg balance scale from 36/56 to 40/56    Time 6    Period Weeks    Status On-going    Target Date 08/15/21      PT LONG TERM GOAL #3   Title The patient will improve gait speed from 2.5 ft/sec to > or equal to 2.62 ft/sec to demo improved community moiblity.    Baseline 2.51 ft/sec :06/27/21    Time 6    Period Weeks    Status On-going    Target Date 08/15/21      PT LONG TERM GOAL #4   Title Pt will maintain TUG at 14.5 seconds to demo maintained mobility    Time 6    Period Weeks    Status Partially Met      PT LONG TERM GOAL #5  Title Pt will demo no s/s of BPPV in all positions of canalith testing    Time 6    Period Weeks    Status New    Target Date 08/15/21                   Plan -  07/11/21 1348     Clinical Impression Statement Pt demos no s/s of BPPV during canalith testing this session. Discussed with pt his risk of reoccurrence and went over [employer] exercises. Pt with report of tenderness/pain along hip/glute -- performed manual therapy accordingly with pt reporting improved weight bearing. Continued to work on hip strengthening.    Personal Factors and Comorbidities Comorbidity 3+;Time since onset of injury/illness/exacerbation    Comorbidities HTN, COPD, spondylitis myelopathy, falls    Examination-Activity Limitations Squat;Stairs;Locomotion Level    Examination-Participation Restrictions Yard Work;Community Activity    Rehab Potential Good    PT Frequency 1x / week    PT Duration 6 weeks    PT Treatment/Interventions ADLs/Self Care Home Management;Patient/family education;Balance training;Neuromuscular re-education;Gait training;Therapeutic activities;Therapeutic exercise;Functional mobility training;Taping;Manual techniques;Vestibular    PT Next Visit Plan progress hip strength, gait and balance. Recheck canals for BPPV -- treat accordingly. Check LTGs.    PT Home Exercise Plan Access Code: [email or web address]    Consulted and Agree with Plan of Care Patient             Patient will benefit from skilled therapeutic intervention in order to improve the following deficits and impairments:  Pain, Decreased activity tolerance, Decreased balance, Decreased strength, Decreased range of motion, Impaired flexibility, Impaired tone, Increased fascial restricitons  Visit Diagnosis: Muscle weakness (generalized)  Unsteadiness on feet  Other abnormalities of gait and mobility  BPPV (benign paroxysmal positional vertigo), right  Dizziness and giddiness     Problem List Patient Active Problem List   Diagnosis Date Noted   Vertigo 07/08/2021   Hypokalemia 07/08/2021   Nasal congestion 04/11/2021   C. difficile colitis 04/11/2021   Lumbar radiculopathy  03/02/2021   Gait disorder 03/02/2021   Hip pain 03/02/2021   Situational depression 03/02/2021   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[place]) 02/09/2021   Cervical myelopathy ([place]) 11/24/2020   Cystoid macular edema of both eyes 07/28/2020   Status post cervical spinal fusion 07/09/2020   History of total bilateral knee replacement 07/08/2020   Aortic atherosclerosis ([place]) 03/05/2020   Emphysema, unspecified ([place]) 03/05/2020   Heme positive stool 02/29/2020   Leg cramps 11/13/2019   Tibialis anterior tendon tear, nontraumatic 11/13/2019   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   CPAP (continuous positive airway pressure) dependence 03/25/2018   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   Erectile dysfunction 06/20/2017   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 03/06/2017   Peripheral neuropathy 06/19/2016   Hearing loss 02/02/2016   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   Allergic rhinitis 02/02/2016   Atypical chest pain 11/24/2015   Preventative health care 11/24/2015   Onychomycosis 06/30/2015   Degenerative disc disease, lumbar 06/30/2015   Other allergic rhinitis 02/18/2015   Deviated nasal septum 02/18/2015   RLS (restless legs syndrome) 02/18/2015   GERD (gastroesophageal reflux disease) 09/18/2014   Hyperglycemia 03/03/2014   Sleep apnea with use of continuous positive airway pressure (CPAP) 07/15/2013   Carotid stenosis 02/24/2013   Nonspecific abnormal electrocardiogram (ECG) (EKG) 01/06/2013   DJD (degenerative joint disease) of hip 12/24/2012   Lower GI bleed 12/12/2012  Dyspnea 06/12/2011   Hypothyroidism 05/13/2009   Hyperlipidemia 05/13/2009   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   Essential hypertension 05/13/2009   INGUINAL HERNIA 05/13/2009   HIATAL HERNIA 05/13/2009   NEPHROLITHIASIS 05/13/2009   ROSACEA 05/13/2009    [facility] [address] [place], [place], DPT 07/11/2021, 1:49 PM  [facility] [place], [place], [address] Phone: [phone]   Fax:  [phone]  Name: EARMON SHERROW MRN: [id number] Date of Birth: Aug 27, 1941

## 2021-07-12 ENCOUNTER — Ambulatory Visit: Payer: Medicare HMO | Admitting: Family

## 2021-07-12 ENCOUNTER — Telehealth: Payer: Self-pay | Admitting: Family

## 2021-07-12 NOTE — Telephone Encounter (Signed)
Lvm for patient to call back about this message 

## 2021-07-12 NOTE — Telephone Encounter (Signed)
Pt stated he was advised to give a list of his blood pressure for the past couple days please advise.    10/29-124/57 pulse 73 10/30-121/60 pulse 72 10/31-117/59 pulse 72 11/1-117/59 pulse 79

## 2021-07-12 NOTE — Telephone Encounter (Signed)
BP Readings from Last 3 Encounters:  07/08/21 (!) 158/62  06/24/21 (!) 149/67  06/15/21 (!) 145/60   Home bp readings look good. No medication changes at this time.

## 2021-07-13 NOTE — Telephone Encounter (Signed)
Patient advised of provider's comments and advise.

## 2021-07-18 ENCOUNTER — Other Ambulatory Visit: Payer: Self-pay

## 2021-07-18 ENCOUNTER — Ambulatory Visit (INDEPENDENT_AMBULATORY_CARE_PROVIDER_SITE_OTHER): Payer: Medicare HMO | Admitting: Physical Therapy

## 2021-07-18 DIAGNOSIS — M6281 Muscle weakness (generalized): Secondary | ICD-10-CM

## 2021-07-18 DIAGNOSIS — R2681 Unsteadiness on feet: Secondary | ICD-10-CM | POA: Diagnosis not present

## 2021-07-18 DIAGNOSIS — R2689 Other abnormalities of gait and mobility: Secondary | ICD-10-CM | POA: Diagnosis not present

## 2021-07-18 NOTE — Therapy (Addendum)
[facility] [place], [place], [address] Phone: [phone]   Fax:  [phone]  Physical Therapy Treatment  Patient Details  Name: [name] Pratt MRN: [id number] Date of Birth: Feb 01, 1941 Referring Provider (PT): [name], NP   Encounter Date: 07/18/2021   PT End of Session - 07/18/21 0927     Visit Number 19    Number of Visits 24    Date for PT Re-Evaluation 08/15/21    Authorization Type [name]    Authorization - Visit Number 19    Progress Note Due on Visit 20    PT Start Time 0927    PT Stop Time 1010    PT Time Calculation (min) 43 min    Equipment Utilized During Treatment Gait belt    Activity Tolerance Patient tolerated treatment well    Behavior During Therapy [facility] for tasks assessed/performed             Past Medical History:  Diagnosis Date   Aortic atherosclerosis ([place]) 03/05/2020   Arthritis    Cancer ([place])    skin cancer   Carotid stenosis    a. Carotid U/S 3/42: LICA < 87%, RICA 68-11%;  b.  Carotid U/S 5/72:  RICA 62-03%; LICA 5-59%; f/u 1 year   Chest pain     Myoview in 2008 was normal.  Echo 7/09: EF 60%, normal wall motion, mild LVH, mild LAE.   Complication of anesthesia    "stomach does not wake up"   COPD (chronic obstructive pulmonary disease) (HCC)    Emphysema, unspecified ([place]) 03/05/2020   GERD (gastroesophageal reflux disease)    Gout    History of chicken pox    History of hemorrhoids    History of kidney stones    Hyperlipidemia    under control   Hypertension    under control   Hypothyroidism    Ileus ([place])    Inguinal hernia    OSA (obstructive sleep apnea)    Pancreatitis    PONV (postoperative nausea and vomiting)    Rosacea    SIRS (systemic inflammatory response syndrome) ([place]) 03/04/2020   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    Past Surgical History:  Procedure Laterality  Date   ABDOMINAL HERNIA REPAIR  07/15/12   Dr [name]   ANTERIOR CERVICAL DECOMP/DISCECTOMY FUSION N/A 07/09/2020   Procedure: ANTERIOR CERVICAL DECOMPRESSION AND FUSION CERVICAL THREE-FOUR.;  Surgeon: [name], MD;  Location: [facility];  Service: Neurosurgery;  Laterality: N/A;  anterior   BROW LIFT  05/07/01   CYSTOSCOPY WITH URETEROSCOPY AND STENT PLACEMENT Right 01/28/2014   Procedure: CYSTOSCOPY WITH URETEROSCOPY, BASKET RETRIVAL AND  STENT PLACEMENT;  Surgeon: [name], MD;  Location: [facility];  Service: Urology;  Laterality: Right;   epidural injections     multiple procedures   ESOPHAGEAL DILATION  1993 and 1994   multiple times   EYE SURGERY Bilateral 03/25/10, 2012   cataract removal   HEMORRHOID SURGERY  2014   HIATAL HERNIA REPAIR  12/21/93   HOLMIUM LASER APPLICATION Right 7/41/6384   Procedure: HOLMIUM LASER APPLICATION;  Surgeon: [name], MD;  Location: [facility];  Service: Urology;  Laterality: Right;   INGUINAL HERNIA REPAIR Right 07/15/12   Dr [name], x2   JOINT REPLACEMENT  07/25/04   right knee   JOINT REPLACEMENT  07/13/10   left knee   [place]  LAPAROSCOPIC CHOLECYSTECTOMY  09/20/06   with intraoperative cholangiogram and right inguinal herniorrhaphy with mesh    LIGAMENT REPAIR Left 07/2013   shoulder   LITHOTRIPSY     x2   [place] FUSION/FORAMINOTOMY N/A 11/24/2020   Procedure: Posterior Cervical Laminectomy Cervical three-four, Cervical four-five with lateral mass fusion;  Surgeon: [name], MD;  Location: [facility];  Service: Neurosurgery;  Laterality: N/A;   REFRACTIVE SURGERY Bilateral    Right total hip replacement  4/14   SKIN CANCER DESTRUCTION     nose, ear   TONSILLECTOMY  as child   TOTAL HIP ARTHROPLASTY Left    URETHRAL DILATION  1991   [name]    There were no vitals filed for this visit.   Subjective Assessment - 07/18/21 0931     Subjective Pt reports he has tightness in his back/  LEs.  He reports that his RLE "feels lazy and weak".  Pt reports no falls since last visit. Feels dizziness only occasionally when standing too fast.    Pertinent History spondylitic myelopathy, ACDF, HTN, COPD, cervical decompression, sleep apnea    Patient Stated Goals "walking good and have good balance."    Currently in Pain? No/denies    Pain Score 0-No pain                [facility] PT Assessment - 07/18/21 0001       Assessment   Medical Diagnosis gait disorder    Referring Provider (PT) [name], NP    Onset Date/Surgical Date 11/14/20    Hand Dominance Right      Ambulation/Gait   Gait velocity 2.52 ft/sec with cane      Berg Balance Test   Standing Unsupported, One Foot in [employer] balance while stepping or standing   can hold position 15 sec with RLE in back, but only 7 sec with LLE in back.     Timed Up and Go Test   TUG Comments 18.15 without cane, 11.49 with cane               [facility] Adult PT Treatment/Exercise - 07/18/21 0001       Ambulation/Gait   Ambulation Distance (Feet) 80 Feet    Assistive device Straight cane      Lumbar Exercises: Seated   Other Seated Lumbar Exercises bilat shoulder flexion lifting noodle/bamboo dowel from lap to/from overhead with back unsupported by chair for postural strengthening x 10, 2 sets.      Knee/Hip Exercises: Stretches   Passive Hamstring Stretch Right;Left;2 reps;20 seconds    Passive Hamstring Stretch Limitations seated with hip hinge    Piriformis Stretch Right;Left;2 reps;20 seconds   seated, pulling knee to opp shoulder     Knee/Hip Exercises: Aerobic   Nustep L4: 4.5 min  (cues for even wt in hips / leans to Rt)      Knee/Hip Exercises: Standing   Hip Abduction Stengthening;Right;Left;1 set;20 reps    Functional Squat 1 set;10 reps             Balance Exercises - 07/18/21 0001       Balance Exercises: Standing   Partial Tandem Stance Eyes open;Intermittent upper extremity support;2  reps;15 secs    Sidestepping 2 reps    Other Standing Exercises toe taps to 6" step without UE support, alternating feet with close SBA for safety x 5 reps each leg.    Other Standing Exercises Comments backwards  walking x 8 ft x 4 reps                     PT Long Term Goals - 07/18/21 1210       PT LONG TERM GOAL #1   Title The patient will be indep with HEP.    Time 6    Period Weeks    Status On-going      PT LONG TERM GOAL #2   Title The patient will improve Berg balance scale from 36/56 to 40/56    Time 6    Period Weeks    Status On-going      PT LONG TERM GOAL #3   Title The patient will improve gait speed from 2.5 ft/sec to > or equal to 2.62 ft/sec to demo improved community moiblity.    Baseline 2.51 ft/sec :06/27/21    Time 6    Period Weeks    Status On-going      PT LONG TERM GOAL #4   Title Pt will maintain TUG at 14.5 seconds to demo maintained mobility    Baseline 18.15 without cane, 11.49 with cane: 07/18/21    Time 6    Period Weeks    Status Partially Met      PT LONG TERM GOAL #5   Title Pt will demo no s/s of BPPV in all positions of canalith testing    Time 6    Period Weeks    Status On-going                   Plan - 07/18/21 0951     Clinical Impression Statement Gait speed similar to last assessment.  TUG with decreased time without SPC, improved speed with use of SPC; partially met this goal. Pt fatigues quickly with standing dynamic SLS exercises (toe taps to 6" steps).  Pt given short seated rest breaks in between standing exercises. Pt demonstrating more upright posture in sitting and standing today than in previous sessions. Progressing gradually towards goals.    Personal Factors and Comorbidities Comorbidity 3+;Time since onset of injury/illness/exacerbation    Comorbidities HTN, COPD, spondylitis myelopathy, falls    Examination-Activity Limitations Squat;Stairs;Locomotion Level    Examination-Participation  Restrictions Yard Work;Community Activity    Rehab Potential Good    PT Frequency 1x / week    PT Duration 6 weeks    PT Treatment/Interventions ADLs/Self Care Home Management;Patient/family education;Balance training;Neuromuscular re-education;Gait training;Therapeutic activities;Therapeutic exercise;Functional mobility training;Taping;Manual techniques;Vestibular    PT Next Visit Plan progress hip strength, gait and balance. Recheck canals for BPPV -- treat accordingly. Check LTGs- 20th VISIT NOTE   PT Home Exercise Plan Access Code: [email or web address]    Consulted and Agree with Plan of Care Patient             Patient will benefit from skilled therapeutic intervention in order to improve the following deficits and impairments:  Pain, Decreased activity tolerance, Decreased balance, Decreased strength, Decreased range of motion, Impaired flexibility, Impaired tone, Increased fascial restricitons  Visit Diagnosis: Muscle weakness (generalized)  Unsteadiness on feet  Other abnormalities of gait and mobility     Problem List Patient Active Problem List   Diagnosis Date Noted   Vertigo 07/08/2021   Hypokalemia 07/08/2021   Nasal congestion 04/11/2021   C. difficile colitis 04/11/2021   Lumbar radiculopathy 03/02/2021   Gait disorder 03/02/2021   Hip pain 03/02/2021   Situational depression 03/02/2021   Exudative age-related macular degeneration of right  eye with active choroidal neovascularization ([place]) 02/09/2021   Cervical myelopathy ([place]) 11/24/2020   Cystoid macular edema of both eyes 07/28/2020   Status post cervical spinal fusion 07/09/2020   History of total bilateral knee replacement 07/08/2020   Aortic atherosclerosis ([place]) 03/05/2020   Emphysema, unspecified ([place]) 03/05/2020   Heme positive stool 02/29/2020   Leg cramps 11/13/2019   Tibialis anterior tendon tear, nontraumatic 11/13/2019   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   CPAP (continuous positive  airway pressure) dependence 03/25/2018   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   Erectile dysfunction 06/20/2017   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 03/06/2017   Peripheral neuropathy 06/19/2016   Hearing loss 02/02/2016   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   Allergic rhinitis 02/02/2016   Atypical chest pain 11/24/2015   Preventative health care 11/24/2015   Onychomycosis 06/30/2015   Degenerative disc disease, lumbar 06/30/2015   Other allergic rhinitis 02/18/2015   Deviated nasal septum 02/18/2015   RLS (restless legs syndrome) 02/18/2015   GERD (gastroesophageal reflux disease) 09/18/2014   Hyperglycemia 03/03/2014   Sleep apnea with use of continuous positive airway pressure (CPAP) 07/15/2013   Carotid stenosis 02/24/2013   Nonspecific abnormal electrocardiogram (ECG) (EKG) 01/06/2013   DJD (degenerative joint disease) of hip 12/24/2012   Lower GI bleed 12/12/2012   Dyspnea 06/12/2011   Hypothyroidism 05/13/2009   Hyperlipidemia 05/13/2009   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   Essential hypertension 05/13/2009   INGUINAL HERNIA 05/13/2009   HIATAL HERNIA 05/13/2009   NEPHROLITHIASIS 05/13/2009   ROSACEA 05/13/2009   Kerin Perna, PTA 07/18/21 12:14 PM  [facility] [place] [address] [place], [place], [address] Phone: [phone]   Fax:  [phone]  Name: [name] Pratt MRN: [id number] Date of Birth: Dec 10, 1940

## 2021-07-21 ENCOUNTER — Ambulatory Visit (INDEPENDENT_AMBULATORY_CARE_PROVIDER_SITE_OTHER): Payer: Medicare HMO | Admitting: Ophthalmology

## 2021-07-21 ENCOUNTER — Other Ambulatory Visit: Payer: Self-pay

## 2021-07-21 ENCOUNTER — Encounter (INDEPENDENT_AMBULATORY_CARE_PROVIDER_SITE_OTHER): Payer: Self-pay | Admitting: Ophthalmology

## 2021-07-21 DIAGNOSIS — H35073 Retinal telangiectasis, bilateral: Secondary | ICD-10-CM

## 2021-07-21 DIAGNOSIS — G4730 Sleep apnea, unspecified: Secondary | ICD-10-CM | POA: Diagnosis not present

## 2021-07-21 DIAGNOSIS — H353211 Exudative age-related macular degeneration, right eye, with active choroidal neovascularization: Secondary | ICD-10-CM | POA: Diagnosis not present

## 2021-07-21 MED ORDER — BEVACIZUMAB 2.5 MG/0.1ML IZ SOSY
2.5000 mg | PREFILLED_SYRINGE | INTRAVITREAL | Status: AC | PRN
  Administered 2021-07-21: 2.5 mg via INTRAVITREAL

## 2021-07-21 NOTE — Progress Notes (Signed)
07/21/2021     CHIEF COMPLAINT Patient presents for  Chief Complaint  Patient presents with   Retina Follow Up      HISTORY OF PRESENT ILLNESS: [name] Pratt is a 80 y.o. male who presents to the clinic today for:   HPI     Retina Follow Up   Patient presents with  Wet AMD.  In right eye.  This started 8 weeks ago.  Duration of 8 weeks.  Since onset it is stable.        Comments   8 week fu OU oct. Patient states vision is stable and unchanged since last visit. Denies any new floaters or FOL. Pt states "I went to ER at [facility] for high blood pressure but it is controlled now, within the last 8 weeks I don't remember when. I went in because I was experiencing vertigo for a couple days. It would have been about 3 weeks ago."      Last edited by Laurin Coder on 07/21/2021  9:50 AM.      Referring physician: [name], NP [address] [place],  [place] [address]  HISTORICAL INFORMATION:   Selected notes from the MEDICAL RECORD NUMBER    Lab Results  Component Value Date   HGBA1C 5.7 12/29/2020     CURRENT MEDICATIONS: Current Outpatient Medications (Ophthalmic Drugs)  Medication Sig   Artificial Tear Solution (SOOTHE XP) SOLN Place 1 drop into both eyes 2 (two) times daily as needed (Dry eyes).   cycloSPORINE (RESTASIS) 0.05 % ophthalmic emulsion Place 1 drop into both eyes 2 (two) times daily.   No current facility-administered medications for this visit. (Ophthalmic Drugs)   Current Outpatient Medications (Other)  Medication Sig   acetaminophen (TYLENOL) 650 MG CR tablet Take 650-1,300 mg by mouth See admin instructions. Take 1300 mg in the morning and 650 g at bedtime   albuterol (VENTOLIN HFA) 108 (90 Base) MCG/ACT inhaler Inhale 2 puffs into the lungs every 6 (six) hours as needed for shortness of breath.   Alcohol Swabs (ALCOHOL PADS) 70 % PADS 1 packet by Does not apply route 2 (two) times daily.   allopurinol (ZYLOPRIM)  100 MG tablet TAKE 1 TABLET TWICE DAILY   amLODipine (NORVASC) 10 MG tablet Take 1 tablet (10 mg total) by mouth daily.   aspirin EC 81 MG tablet Take 81 mg by mouth every morning.   blood glucose meter kit and supplies Dispense based on patient and insurance preference. Use up to four times daily as directed. (FOR ICD-10 E10.9, E11.9).   Cholecalciferol (VITAMIN D) 50 MCG (2000 UT) CAPS Take 2,000 Units by mouth daily.   cholestyramine (QUESTRAN) 4 g packet Take 1 packet (4 g total) by mouth 2 (two) times daily as needed.   colchicine 0.6 MG tablet Take 1 tablet by mouth daily as needed (Gout).   Cranberry 500 MG TABS Take 500 mg by mouth daily.    diclofenac Sodium (VOLTAREN) 1 % GEL Apply 2 g topically daily as needed (Hand pain).   gabapentin (NEURONTIN) 300 MG capsule Take 300 mg by mouth 2 (two) times daily as needed.   glucose blood test strip TRUE Metrix Blood Glucose Test Strip, use as instructed   Homeopathic Products (THERAWORX RELIEF) FOAM Apply 1 application topically 2 (two) times daily as needed (Pain).   hydrALAZINE (APRESOLINE) 50 MG tablet TAKE 1 TABLET THREE TIMES DAILY   Lancets MISC 1 each by Does not apply  route 2 (two) times daily. TRUE matrix lancets   levothyroxine (SYNTHROID) 75 MCG tablet TAKE 2 TABS BY MOUTH ONCE DAILY 6 DAYS A WEEK AND 1 TAB BY MOUTH ONCE DAILY ONE DAY A WEEK   losartan (COZAAR) 100 MG tablet Take 1 tablet (100 mg total) by mouth daily.   Magnesium 250 MG TABS Take by mouth.   meclizine (ANTIVERT) 25 MG tablet Take 1 tablet (25 mg total) by mouth 3 (three) times daily as needed for dizziness.   Menthol, Topical Analgesic, (ZIMS MAX-FREEZE EX) Apply 1 application topically daily as needed (pain).   multivitamin (THERAGRAN) per tablet Take 1 tablet by mouth daily.   nitroGLYCERIN (NITROSTAT) 0.4 MG SL tablet Place 1 tablet (0.4 mg total) under the tongue every 5 (five) minutes as needed for chest pain.   Omega-3 Fatty Acids (FISH OIL) 1200 MG CAPS Take  1,200 mg by mouth daily.   potassium chloride (KLOR-CON) 10 MEQ tablet Take 1 tablet (10 mEq total) by mouth once for 1 dose.   rosuvastatin (CRESTOR) 5 MG tablet TAKE 1 TABLET EVERY DAY   sodium chloride (OCEAN) 0.65 % nasal spray Place 1 spray into both nostrils at bedtime as needed for congestion.   No current facility-administered medications for this visit. (Other)      REVIEW OF SYSTEMS:    ALLERGIES Allergies  Allergen Reactions   Pravastatin Other (See Comments)    Muscle cramps   Sildenafil Other (See Comments)    Tachycardia     Metoclopramide    Oxycodone     hallucinations   Atenolol Other (See Comments)    bradycardia   Elastic Bandages & [Zinc] Other (See Comments)    Teflon bandages - Irritation   Metoclopramide Hcl Anxiety    PAST MEDICAL HISTORY Past Medical History:  Diagnosis Date   Aortic atherosclerosis ([place]) 03/05/2020   Arthritis    Cancer ([place])    skin cancer   Carotid stenosis    a. Carotid U/S 2/87: LICA < 86%, RICA 76-72%;  b.  Carotid U/S 0/94:  RICA 70-96%; LICA 2-83%; f/u 1 year   Chest pain     Myoview in 2008 was normal.  Echo 7/09: EF 60%, normal wall motion, mild LVH, mild LAE.   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   Past Surgical History:  Procedure Laterality Date   ABDOMINAL HERNIA REPAIR  07/15/12   Dr  [name]   ANTERIOR CERVICAL DECOMP/DISCECTOMY FUSION N/A 07/09/2020   Procedure: ANTERIOR CERVICAL DECOMPRESSION AND FUSION CERVICAL THREE-FOUR.;  Surgeon: [name], MD;  Location: [place];  Service: Neurosurgery;  Laterality: N/A;  anterior   BROW LIFT  05/07/01   CYSTOSCOPY WITH URETEROSCOPY AND STENT PLACEMENT Right 01/28/2014   Procedure:  CYSTOSCOPY WITH URETEROSCOPY, BASKET RETRIVAL AND  STENT PLACEMENT;  Surgeon: [name], MD;  Location: [facility];  Service: Urology;  Laterality: Right;   epidural injections     multiple procedures   ESOPHAGEAL DILATION  1993 and 1994   multiple times   EYE SURGERY Bilateral 03/25/10, 2012   cataract removal   HEMORRHOID SURGERY  2014   HIATAL HERNIA REPAIR  12/21/93   HOLMIUM LASER APPLICATION Right 9/67/8938   Procedure: HOLMIUM LASER APPLICATION;  Surgeon: [name], MD;  Location: [facility];  Service: Urology;  Laterality: Right;   INGUINAL HERNIA REPAIR Right 07/15/12   Dr [name], x2   JOINT REPLACEMENT  07/25/04   right knee   JOINT REPLACEMENT  07/13/10   left knee   [place]  09/20/06   with intraoperative cholangiogram and right inguinal herniorrhaphy with mesh    LIGAMENT REPAIR Left 07/2013   shoulder   LITHOTRIPSY     x2   [place] FUSION/FORAMINOTOMY N/A 11/24/2020   Procedure: Posterior Cervical Laminectomy Cervical three-four, Cervical four-five with lateral mass fusion;  Surgeon: [name], MD;  Location: [place];  Service: Neurosurgery;  Laterality: N/A;   REFRACTIVE SURGERY Bilateral    Right total hip replacement  4/14   SKIN CANCER DESTRUCTION     nose, ear   TONSILLECTOMY  as child   TOTAL HIP ARTHROPLASTY Left    URETHRAL DILATION  1991   [name]    FAMILY HISTORY Family History  Problem Relation Age of Onset   Cancer Mother    Hypertension Other    Cancer Other    Osteoarthritis Other     SOCIAL HISTORY Social History    Tobacco Use   Smoking status: Former    Years: 11.00    Types: Cigarettes    Quit date: 09/11/1978    Years since quitting: 42.8   Smokeless tobacco: Never  Vaping Use   Vaping Use: Never used  Substance Use Topics   Alcohol use: Not Currently    Alcohol/week: 0.0 standard drinks    Comment: rare   Drug use: No         OPHTHALMIC EXAM:  Base Eye Exam     Visual Acuity (ETDRS)       Right Left   Dist [place] 20/70 -1+2 20/40 -2   Dist ph [address] 20/60 -2 NI         Tonometry (Tonopen, 9:49 AM)       Right Left   Pressure 12 6         Pupils       Pupils Dark Light Shape React APD   Right PERRL 1.5 1 Round Minimal None   Left PERRL 1.5 1 Round Minimal None         Visual Fields       Left Right    Full          Extraocular Movement       Right Left    Full Full         Neuro/Psych     Oriented x3: Yes   Mood/Affect: Normal         Dilation     Both eyes: 1.0% Mydriacyl, 2.5% Phenylephrine @ 9:49 AM           Slit Lamp and Fundus Exam     External Exam  Right Left   External Normal Normal         Slit Lamp Exam       Right Left   Lids/Lashes Normal Normal   Conjunctiva/Sclera White and quiet White and quiet   Cornea Clear Clear   Anterior Chamber Deep and quiet Deep and quiet   [place]ris PI PI   Lens Clear Clear   Anterior Vitreous Normal Normal         Fundus Exam       Right Left   Posterior Vitreous Posterior vitreous detachment Posterior vitreous detachment   Disc Normal Normal   C/D Ratio 0.5 0.5   Macula No overt CME, angulated vessels temporally, with thickening along the p.m. bundle. No overt CME, angulated vessels temporally   Vessels Normal Normal   Periphery Normal Normal            IMAGING AND PROCEDURES  Imaging and Procedures for 07/21/21  OCT, Retina - OU - Both Eyes       Right Eye Quality was good. Scan locations included subfoveal. Central Foveal Thickness: 268. Progression has  improved. Findings include abnormal foveal contour, cystoid macular edema.   Left Eye Quality was good. Scan locations included subfoveal. Central Foveal Thickness: 220. Progression has been stable. Findings include abnormal foveal contour, cystoid macular edema.   Notes Chronic active CME OD increased at 3 months post injection , will restart use of AVASTIN OD, now      Intravitreal Injection, Pharmacologic Agent - OD - Right Eye       Time Out 07/21/2021. 10:12 AM. Confirmed correct patient, procedure, site, and patient consented.   Anesthesia Topical anesthesia was used. Anesthetic medications included Lidocaine 4%.   Procedure Preparation included Tobramycin 0.3%, 10% betadine to eyelids, 5% betadine to ocular surface. A 30 gauge needle was used.   Injection: 2.5 mg bevacizumab 2.5 MG/0.1ML   Route: Intravitreal, Site: Right Eye   NDC: [phone], Lot: [id number]   Post-op Post injection exam found visual acuity of at least counting fingers, no retinal detachment, perfused optic nerve. The patient tolerated the procedure well. There were no complications. The patient received written and verbal post procedure care education. Post injection medications included ocuflox.              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 Exudative age-related  macular degeneration of right eye with active choroidal neovascularization (HCC)  H35.3211 OCT, Retina - OU - Both Eyes    Intravitreal Injection, Pharmacologic Agent - OD - Right Eye    bevacizumab (AVASTIN) SOSY 2.5 mg    2. Sleep apnea with use of continuous positive airway pressure (CPAP)  G47.30     3. 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  We will restart intravitreal Avastin OD today to control peripapillary CNVM follow-up again in 6 weeks consider treatment again  2.  Excellent compliance with CPAP helps prevent MAC-TEL type II developing or progressing further in each eye  3.  Ophthalmic Meds Ordered this visit:  Meds ordered this encounter  Medications   bevacizumab (AVASTIN) SOSY 2.5 mg       Return in about 6 weeks (around 09/01/2021) for dilate, OD, AVASTIN OCT.  There are no Patient Instructions on file for this visit.   Explained the diagnoses, plan, and follow up with the patient and they expressed understanding.  Patient expressed understanding of the importance of proper follow up care.   Clent Demark Charnee Turnipseed M.D. Diseases & Surgery of the Retina and Vitreous Retina & Diabetic [place] 07/21/21     Abbreviations: M myopia (nearsighted); A astigmatism; H hyperopia (farsighted); P presbyopia; Mrx spectacle prescription;  CTL contact lenses; OD right eye; OS left eye; OU both eyes  XT exotropia; ET esotropia; PEK punctate epithelial keratitis; PEE punctate epithelial erosions; DES dry eye syndrome; MGD meibomian gland dysfunction; ATs artificial tears; PFAT's preservative free artificial tears; [place] nuclear sclerotic cataract; PSC posterior subcapsular cataract; ERM epi-retinal membrane; PVD posterior vitreous detachment; RD retinal detachment; DM diabetes mellitus; DR diabetic retinopathy; NPDR non-proliferative diabetic retinopathy; PDR proliferative diabetic retinopathy; CSME clinically significant macular edema; DME diabetic macular edema;  dbh dot blot hemorrhages; CWS cotton wool spot; POAG primary open angle glaucoma; C/D cup-to-disc ratio; HVF humphrey visual field; GVF goldmann visual field; OCT optical coherence tomography; IOP intraocular pressure; BRVO Branch retinal vein occlusion; CRVO central retinal vein occlusion; CRAO central retinal artery occlusion; BRAO branch retinal artery occlusion; RT retinal tear; SB scleral buckle; PPV pars plana vitrectomy; VH Vitreous hemorrhage; PRP panretinal laser photocoagulation; IVK intravitreal kenalog; VMT vitreomacular traction; MH Macular hole;  NVD neovascularization of the disc; NVE neovascularization elsewhere; AREDS age related eye disease study; ARMD age related macular degeneration; POAG primary open angle glaucoma; EBMD epithelial/anterior basement membrane dystrophy; ACIOL anterior chamber intraocular lens; IOL intraocular lens; PCIOL posterior chamber intraocular lens; Phaco/IOL phacoemulsification with intraocular lens placement; [place] photorefractive keratectomy; LASIK laser assisted in situ keratomileusis; HTN hypertension; DM diabetes mellitus; COPD chronic obstructive pulmonary disease

## 2021-07-21 NOTE — Assessment & Plan Note (Signed)
OD with recurrence of CNVM peripapillary currently at 66-month interval post injection Avastin we will repeat injection today and examination again and 6 weeks OD

## 2021-07-21 NOTE — Assessment & Plan Note (Signed)
No progression left eye by OCT.,  With temporal CME, OS  OD, likely exacerbates the development of CNVM peripapillary yet patient continues on excellent CPAP compliance, and the temporal aspect of the macula appears clear

## 2021-07-21 NOTE — Assessment & Plan Note (Signed)
Excellent compliance with CPAP

## 2021-07-22 ENCOUNTER — Other Ambulatory Visit: Payer: Self-pay

## 2021-07-22 ENCOUNTER — Encounter: Payer: Medicare HMO | Admitting: Physical Therapy

## 2021-07-22 DIAGNOSIS — T8859XA Other complications of anesthesia, initial encounter: Secondary | ICD-10-CM | POA: Insufficient documentation

## 2021-07-22 DIAGNOSIS — Z87442 Personal history of urinary calculi: Secondary | ICD-10-CM | POA: Insufficient documentation

## 2021-07-22 DIAGNOSIS — M4690 Unspecified inflammatory spondylopathy, site unspecified: Secondary | ICD-10-CM

## 2021-07-22 DIAGNOSIS — R112 Nausea with vomiting, unspecified: Secondary | ICD-10-CM | POA: Insufficient documentation

## 2021-07-22 DIAGNOSIS — Z9889 Other specified postprocedural states: Secondary | ICD-10-CM

## 2021-07-22 DIAGNOSIS — J449 Chronic obstructive pulmonary disease, unspecified: Secondary | ICD-10-CM | POA: Insufficient documentation

## 2021-07-22 DIAGNOSIS — I10 Essential (primary) hypertension: Secondary | ICD-10-CM | POA: Insufficient documentation

## 2021-07-22 DIAGNOSIS — Z8619 Personal history of other infectious and parasitic diseases: Secondary | ICD-10-CM | POA: Insufficient documentation

## 2021-07-22 DIAGNOSIS — C801 Malignant (primary) neoplasm, unspecified: Secondary | ICD-10-CM | POA: Insufficient documentation

## 2021-07-22 DIAGNOSIS — M25559 Pain in unspecified hip: Secondary | ICD-10-CM

## 2021-07-22 DIAGNOSIS — M461 Sacroiliitis, not elsewhere classified: Secondary | ICD-10-CM

## 2021-07-22 DIAGNOSIS — K567 Ileus, unspecified: Secondary | ICD-10-CM | POA: Insufficient documentation

## 2021-07-22 DIAGNOSIS — R7309 Other abnormal glucose: Secondary | ICD-10-CM

## 2021-07-22 DIAGNOSIS — Z85828 Personal history of other malignant neoplasm of skin: Secondary | ICD-10-CM

## 2021-07-22 DIAGNOSIS — Z8719 Personal history of other diseases of the digestive system: Secondary | ICD-10-CM | POA: Insufficient documentation

## 2021-07-22 HISTORY — DX: Pain in unspecified hip: M25.559

## 2021-07-22 HISTORY — DX: Sacroiliitis, not elsewhere classified: M46.1

## 2021-07-22 HISTORY — DX: Unspecified inflammatory spondylopathy, site unspecified: M46.90

## 2021-07-22 HISTORY — DX: Other abnormal glucose: R73.09

## 2021-07-22 HISTORY — DX: Other specified postprocedural states: Z98.890

## 2021-07-22 HISTORY — DX: Personal history of other malignant neoplasm of skin: Z85.828

## 2021-07-25 ENCOUNTER — Ambulatory Visit (INDEPENDENT_AMBULATORY_CARE_PROVIDER_SITE_OTHER): Payer: Medicare HMO

## 2021-07-25 ENCOUNTER — Other Ambulatory Visit: Payer: Self-pay

## 2021-07-25 ENCOUNTER — Ambulatory Visit (INDEPENDENT_AMBULATORY_CARE_PROVIDER_SITE_OTHER): Payer: Medicare HMO | Admitting: Cardiology

## 2021-07-25 ENCOUNTER — Encounter: Payer: Self-pay | Admitting: Cardiology

## 2021-07-25 ENCOUNTER — Telehealth: Payer: Self-pay | Admitting: Cardiology

## 2021-07-25 VITALS — BP 142/62 | HR 67 | Ht 66.0 in | Wt 161.0 lb

## 2021-07-25 DIAGNOSIS — I10 Essential (primary) hypertension: Secondary | ICD-10-CM

## 2021-07-25 DIAGNOSIS — R011 Cardiac murmur, unspecified: Secondary | ICD-10-CM

## 2021-07-25 DIAGNOSIS — I700 Atherosclerosis of aorta: Secondary | ICD-10-CM

## 2021-07-25 DIAGNOSIS — R079 Chest pain, unspecified: Secondary | ICD-10-CM

## 2021-07-25 DIAGNOSIS — R0789 Other chest pain: Secondary | ICD-10-CM

## 2021-07-25 DIAGNOSIS — R002 Palpitations: Secondary | ICD-10-CM

## 2021-07-25 DIAGNOSIS — E782 Mixed hyperlipidemia: Secondary | ICD-10-CM | POA: Diagnosis not present

## 2021-07-25 HISTORY — DX: Cardiac murmur, unspecified: R01.1

## 2021-07-25 HISTORY — DX: Palpitations: R00.2

## 2021-07-25 NOTE — Progress Notes (Signed)
Cardiology Office Note:    Date:  07/25/2021   ID:  [name] Pratt, DOB 21-Sep-1940, MRN [id number]  PCP:  [name], NP  Cardiologist:  Jenean Lindau, MD   Referring MD: Mackie Pai, PA-C    ASSESSMENT:    1. Benign essential hypertension   2. Mixed hyperlipidemia   3. Aortic atherosclerosis ([place])   4. Atypical chest pain   5. Palpitations   6. Cardiac murmur    PLAN:    In order of problems listed above:  Atherosclerotic vascular disease: Chest pain: Secondary prevention stressed with the patient.  Importance of compliance with diet medication stressed any vocalized understanding.  His chest pain is atypical in nature however in view of multiple risk factors we will do a Lexiscan sestamibi to assess his symptoms objectively.  He is agreeable.  He knows to go to the nearest emergency room for any significant issues.  These were discussed with him. Essential hypertension: Blood pressure stable and diet was emphasized. Mixed dyslipidemia: On statin therapy.  Lipids followed by primary care Palpitations and vertigo-like sensation.  In view of the symptoms and conduction system disturbances on the EKG I will do a 2-week monitoring to assess for any tachyarrhythmias or any conduction disturbances. Cardiac murmur: Echocardiogram will be done to assess murmur heard on auscultation. Patient will be seen in follow-up appointment in 6 months or earlier if the patient has any concerns    Medication Adjustments/Labs and Tests Ordered: Current medicines are reviewed at length with the patient today.  Concerns regarding medicines are outlined above.  No orders of the defined types were placed in this encounter.  No orders of the defined types were placed in this encounter.    History of Present Illness:    [name] Pratt is a 80 y.o. male who is being seen today for the evaluation of vertigo, palpitations and chest pain at the request of [place], Percell Miller, [place].   Patient is a pleasant 80 year old male.  He has past medical history of aortic atherosclerosis, essential hypertension, dyslipidemia.  He has history of carotid stenosis.  He denies any problems at this time he gives history of chest pain at times.  This is not related to exertion.  No orthopnea or PND.  He takes care of activities of daily living.  Chest pain is stabbing in nature at times.  Past Medical History:  Diagnosis Date   Allergic rhinitis 02/02/2016   Anemia 12/17/2009   Formatting of this note might be different from the original. 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 difficile colitis 04/11/2021   Cancer ([place])    skin cancer   Carotid stenosis    a. Carotid U/S 5/68: LICA < 12%, RICA 75-17%;  b.  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   Cystoid macular edema of both eyes 07/28/2020   Degenerative disc disease, lumbar 06/30/2015   Deviated nasal septum 02/18/2015   Diverticulosis 03/03/2020   DJD (degenerative joint disease) of hip 12/24/2012   Dyspnea 06/12/2011   Emphysema, unspecified ([place]) 03/05/2020   Erectile dysfunction 06/20/2017   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[place]) 02/09/2021   Gait disorder 03/02/2021   GERD  (gastroesophageal reflux disease)    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   Qualifier: Diagnosis of  By: Burnett Kanaris     Greater trochanteric pain syndrome 07/22/2021   Hearing loss 02/02/2016   Heme positive stool 02/29/2020   HIATAL HERNIA 05/13/2009   Qualifier: Diagnosis of  By: Burnett Kanaris     High risk medication use 01/18/2012   Hip pain 03/02/2021   History of chicken pox    History of hemorrhoids    History of kidney stones    History of total bilateral knee replacement 07/08/2020   Hyperglycemia 03/03/2014   Hyperlipidemia    under control   Hyperlipidemia with target LDL less than 130 12/27/2010   Formatting of this note might be different from the original. Cardiology - Dr Frances Nickels at [facility] cardiology  ICD-10 cut over   Hypertension    under control   Hypokalemia 07/08/2021   Hypothyroidism    Ileus ([place])    Internal hemorrhoids 03/03/2020   Leg cramps 11/13/2019   Lower GI bleed 12/12/2012   Lumbago of lumbar region with sciatica 10/21/2020   Lumbar radiculopathy 03/02/2021   Lumbar spondylosis 11/02/2020   Nasal congestion 04/11/2021   NEPHROLITHIASIS 05/13/2009   Qualifier: Diagnosis of  By: Burnett Kanaris     Nonspecific abnormal electrocardiogram (ECG) (EKG) 01/06/2013   Onychomycosis 06/30/2015   Osteoarthrosis, 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 Osteoarthritis Of The Hand  10/1 IMO update   Osteoarthrosis, unspecified whether generalized or localized, pelvic region and thigh 07/25/2010   Formatting of this note might be different from the original. Osteoarthritis Of The Hip   Other abnormal glucose 07/22/2021   Other allergic rhinitis 02/18/2015   Other ill-defined and unknown causes of morbidity and mortality 09/12/1999   Nov 20, 2000 Entered By: Alycia Patten Comment: ltFeb 23, 2006 Entered By: Alycia Patten Comment: rt 11/05   Peripheral neuropathy 06/19/2016   PONV (postoperative nausea and vomiting)    Preventative health care 11/24/2015    Right groin pain 11/29/2012   Right inguinal hernia 05/13/2009   Qualifier: Diagnosis of  By: Burnett Kanaris     RLS (restless legs syndrome) 02/18/2015   Rosacea    Rotator cuff tear 07/27/2013   Situational depression 03/02/2021   Sleep apnea with use of continuous positive airway pressure (CPAP) 07/15/2013   CPAP set to  7 cm water,  Residual AHi was 7.8  With no leak.  User time 6 hours.  479 days , not used only 12 days - highly compliant 06-23-13  .    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   Urinary retention 11/26/2020   Ventral hernia 07/08/2012   Vertigo 07/08/2021   Weight loss 03/03/2020    Past Surgical History:  Procedure Laterality Date   ABDOMINAL HERNIA REPAIR  07/15/12   Dr [name]   ANTERIOR CERVICAL DECOMP/DISCECTOMY FUSION N/A 07/09/2020   Procedure: ANTERIOR CERVICAL DECOMPRESSION AND FUSION CERVICAL THREE-FOUR.;  Surgeon: [name], MD;  Location: [place];  Service: Neurosurgery;  Laterality: N/A;  anterior   BROW LIFT  05/07/01   CYSTOSCOPY WITH URETEROSCOPY AND STENT PLACEMENT Right 01/28/2014   Procedure: CYSTOSCOPY WITH URETEROSCOPY, BASKET RETRIVAL AND  STENT PLACEMENT;  Surgeon: [name], MD;  Location: [facility];  Service: Urology;  Laterality: Right;   epidural injections     multiple procedures   ESOPHAGEAL DILATION  1993 and 1994   multiple times   EYE SURGERY Bilateral 03/25/10, 2012   cataract removal   HEMORRHOID SURGERY  2014   HIATAL HERNIA REPAIR  12/21/93   HOLMIUM LASER APPLICATION Right 4/76/5465   Procedure: HOLMIUM LASER APPLICATION;  Surgeon: [name], MD;  Location: [facility];  Service: Urology;  Laterality: Right;   INGUINAL HERNIA REPAIR Right  07/15/12   Dr [name], x2   JOINT REPLACEMENT  07/25/04   right knee   JOINT REPLACEMENT  07/13/10   left knee   [place]  09/20/06   with intraoperative cholangiogram and right inguinal herniorrhaphy with mesh    LIGAMENT REPAIR Left 07/2013   shoulder   LITHOTRIPSY     x2   [place] FUSION/FORAMINOTOMY N/A 11/24/2020   Procedure: Posterior Cervical Laminectomy Cervical three-four, Cervical four-five with lateral mass fusion;  Surgeon: [name], MD;  Location: [place];  Service: Neurosurgery;  Laterality: N/A;   REFRACTIVE SURGERY Bilateral    Right total hip replacement  4/14   SKIN CANCER DESTRUCTION     nose, ear   TONSILLECTOMY  as child   TOTAL HIP ARTHROPLASTY Left    URETHRAL DILATION  1991   [name]    Current Medications: Current Meds  Medication Sig   acetaminophen (TYLENOL) 650 MG CR tablet Take 1,300 mg by mouth every morning. And take 650 mg at bedtime   albuterol (VENTOLIN HFA) 108 (90 Base) MCG/ACT inhaler Inhale 2 puffs into the lungs every 6 (six) hours as needed for shortness of breath.   Alcohol Swabs (ALCOHOL PADS) 70 % PADS 1 packet by Does not apply route 2 (two) times daily.   allopurinol (ZYLOPRIM) 100 MG tablet Take 100 mg by mouth 2 (two) times daily.   amLODipine (NORVASC) 10 MG tablet Take 1 tablet (10 mg total) by mouth daily.   aspirin EC 81 MG tablet Take 81 mg by mouth every morning.   blood glucose meter kit and supplies Dispense based on patient and insurance preference. Use up to four times daily as directed. (FOR ICD-10 E10.9, E11.9).   Cholecalciferol (VITAMIN D) 50 MCG (2000 UT) CAPS Take 2,000 Units by mouth daily.   colchicine 0.6 MG tablet Take 0.6 mg by mouth as needed for other. For gout   cycloSPORINE (RESTASIS) 0.05 % ophthalmic emulsion Place 1 drop into both eyes 2 (two) times daily.   diclofenac Sodium (VOLTAREN) 1 % GEL Apply 1 application topically  daily as needed for pain (Hand pain).   fluocinonide (LIDEX) 0.05 % external solution Apply 1 application topically 2 (two) times daily as needed for itching or pain.   gabapentin (NEURONTIN) 300 MG capsule Take 300 mg by mouth 2 (two) times daily as needed for seizure.   glucose blood  test strip TRUE Metrix Blood Glucose Test Strip, use as instructed   Homeopathic Products (THERAWORX RELIEF) FOAM Apply 1 application topically 2 (two) times daily as needed (Pain).   hydrALAZINE (APRESOLINE) 50 MG tablet Take 50 mg by mouth 3 (three) times daily.   Lancets MISC 1 each by Does not apply route 2 (two) times daily. TRUE matrix lancets   levothyroxine (SYNTHROID) 75 MCG tablet TAKE 2 TABS BY MOUTH ONCE DAILY 6 DAYS A WEEK AND 1 TAB BY MOUTH ONCE DAILY ONE DAY A WEEK   losartan (COZAAR) 100 MG tablet Take 1 tablet (100 mg total) by mouth daily.   meclizine (ANTIVERT) 25 MG tablet Take 1 tablet (25 mg total) by mouth 3 (three) times daily as needed for dizziness.   Menthol, Topical Analgesic, (ZIMS MAX-FREEZE EX) Apply 1 application topically daily as needed (pain).   mirabegron ER (MYRBETRIQ) 25 MG TB24 tablet Take 25 mg by mouth daily.   multivitamin (THERAGRAN) per tablet Take 1 tablet by mouth daily.   nitroGLYCERIN (NITROSTAT) 0.4 MG SL tablet Place 1 tablet (0.4 mg total) under the tongue every 5 (five) minutes as needed for chest pain.   rosuvastatin (CRESTOR) 5 MG tablet Take 5 mg by mouth daily.   sodium chloride (OCEAN) 0.65 % nasal spray Place 1 spray into both nostrils at bedtime as needed for congestion.     Allergies:   Pravastatin, Sildenafil, Oxycodone, Atenolol, Elastic bandages & [zinc], and Metoclopramide hcl   Social History   Socioeconomic History   Marital status: Married    Spouse name: Not on file   Number of children: 1   Years of education: Not on file   Highest education level: Not on file  Occupational History   Occupation: firefighter    Comment: paints on the side   Tobacco Use   Smoking status: Former    Years: 11.00    Types: Cigarettes    Quit date: 09/11/1978    Years since quitting: 42.8   Smokeless tobacco: Never  Vaping Use   Vaping Use: Never used  Substance and Sexual Activity   Alcohol use: Not Currently    Alcohol/week: 0.0 standard drinks    Comment: rare   Drug use: No   Sexual activity: Not on file  Other Topics Concern   Not on file  Social History Narrative   Lives with his wife   He has one son- lives locally.   2 grandchildren   Has worked for [employer], trade [employer]   Enjoys wood working   Completed HS.  [employer]   Social Determinants of Radio broadcast assistant Strain: Not on file  Food Insecurity: Not on file  Transportation Needs: Not on file  Physical Activity: Not on file  Stress: Not on file  Social Connections: Not on file     Family History: The patient's family history includes Cancer in his mother and another family member; Hypertension in an other family member; Osteoarthritis in an other family member.  ROS:   Please see the history of present illness.    All other systems reviewed and are negative.  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   Recent Labs: 11/30/2020: Magnesium 1.9 06/15/2021: TSH 2.77 06/24/2021: ALT 24; Hemoglobin 12.2; Platelets 320 07/08/2021: BUN 33; Creatinine, Ser 1.43; Potassium 4.1; Sodium 142  Recent Lipid Panel    Component Value Date/Time  CHOL 106 06/15/2020 1235   TRIG 98 06/15/2020 1235   HDL 38 (L) 06/15/2020 1235   CHOLHDL 2.8 06/15/2020 1235   VLDL 14.2 09/19/2019 1018   LDLCALC 50 06/15/2020 1235    Physical Exam:    VS:  BP (!) 142/62   Pulse 67   Ht '5\' 6"'  (1.676 m)   Wt 161 lb (73 kg)   SpO2 97%   BMI 25.99 kg/m     Wt Readings from Last 3 Encounters:  07/25/21 161 lb (73 kg)  07/08/21 159 lb (72.1 kg)  06/24/21 150 lb  (68 kg)     GEN: Patient is in no acute distress HEENT: Normal NECK: No JVD; No carotid bruits LYMPHATICS: No lymphadenopathy CARDIAC: S1 S2 regular, 2/6 systolic murmur at the apex. RESPIRATORY:  Clear to auscultation without rales, wheezing or rhonchi  ABDOMEN: Soft, non-tender, non-distended MUSCULOSKELETAL:  No edema; No deformity  SKIN: Warm and dry NEUROLOGIC:  Alert and oriented x 3 PSYCHIATRIC:  Normal affect    Signed, Jenean Lindau, MD  07/25/2021 10:08 AM    [place]

## 2021-07-25 NOTE — Telephone Encounter (Signed)
Pt is inquiring about the cost of his device.. please advise

## 2021-07-25 NOTE — Patient Instructions (Signed)
Medication Instructions:  Your physician recommends that you continue on your current medications as directed. Please refer to the Current Medication list given to you today.  *If you need a refill on your cardiac medications before your next appointment, please call your pharmacy*   Lab Work: None ordered If you have labs (blood work) drawn today and your tests are completely normal, you will receive your results only by: [place] (if you have MyChart) OR A paper copy in the mail If you have any lab test that is abnormal or we need to change your treatment, we will call you to review the results.   Testing/Procedures: Your physician has requested that you have a lexiscan myoview. For further information please visit [email or web address]. Please follow instruction sheet, as given.  The test will take approximately 3 to 4 hours to complete; you may bring reading material.  If someone comes with you to your appointment, they will need to remain in the main lobby due to limited space in the testing area.   How to prepare for your Myocardial Perfusion Test: Do not eat or drink 3 hours prior to your test, except you may have water. Do not consume products containing caffeine (regular or decaffeinated) 12 hours prior to your test. (ex: coffee, chocolate, sodas, tea). Do bring a list of your current medications with you.  If not listed below, you may take your medications as normal. Do wear comfortable clothes (no dresses or overalls) and walking shoes, tennis shoes preferred (No heels or open toe shoes are allowed). Do NOT wear cologne, perfume, aftershave, or lotions (deodorant is allowed). If these instructions are not followed, your test will have to be rescheduled.  Your physician has requested that you have an echocardiogram. Echocardiography is a painless test that uses sound waves to create images of your heart. It provides your doctor with information about the size and shape of  your heart and how well your heart's chambers and valves are working. This procedure takes approximately one hour. There are no restrictions for this procedure.   WHY IS MY DOCTOR PRESCRIBING ZIO? The Zio system is proven and trusted by physicians to detect and diagnose irregular heart rhythms -- and has been prescribed to hundreds of thousands of patients.  The FDA has cleared the Zio system to monitor for many different kinds of irregular heart rhythms. In a study, physicians were able to reach a diagnosis 90% of the time with the Zio system1.  You can wear the Zio monitor -- a small, discreet, comfortable patch -- during your normal day-to-day activity, including while you sleep, shower, and exercise, while it records every single heartbeat for analysis.  1Barrett, P., et al. Comparison of 24 Hour Holter Monitoring Versus 14 Day Novel Adhesive Patch Electrocardiographic Monitoring. [place], 2014.  ZIO VS. HOLTER MONITORING The Zio monitor can be comfortably worn for up to 14 days. Holter monitors can be worn for 24 to 48 hours, limiting the time to record any irregular heart rhythms you may have. Zio is able to capture data for the 51% of patients who have their first symptom-triggered arrhythmia after 48 hours.1  LIVE WITHOUT RESTRICTIONS The Zio ambulatory cardiac monitor is a small, unobtrusive, and water-resistant patch--you might even forget you're wearing it. The Zio monitor records and stores every beat of your heart, whether you're sleeping, working out, or showering. Wear the monitor for 14 days, remove 08/08/21.  Follow-Up: At [facility], you and your health needs are  our priority.  As part of our continuing mission to provide you with exceptional heart care, we have created designated Provider Care Teams.  These Care Teams include your primary Cardiologist (physician) and Advanced Practice Providers (APPs -  Physician Assistants and Nurse Practitioners) who  all work together to provide you with the care you need, when you need it.  We recommend signing up for the patient portal called "MyChart".  Sign up information is provided on this After Visit Summary.  MyChart is used to connect with patients for Virtual Visits (Telemedicine).  Patients are able to view lab/test results, encounter notes, upcoming appointments, etc.  Non-urgent messages can be sent to your provider as well.   To learn more about what you can do with MyChart, go to [email or web address].    Your next appointment:   6 month(s)  The format for your next appointment:   In Person  Provider:   Jyl Heinz, MD   Other Instructions Cardiac Nuclear Scan A cardiac nuclear scan is a test that is done to check the flow of blood to your heart. It is done when you are resting and when you are exercising. The test looks for problems such as: Not enough blood reaching a portion of the heart. The heart muscle not working as it should. You may need this test if: You have heart disease. You have had lab results that are not normal. You have had heart surgery or a balloon procedure to open up blocked arteries (angioplasty). You have chest pain. You have shortness of breath. In this test, a special dye (tracer) is put into your bloodstream. The tracer will travel to your heart. A camera will then take pictures of your heart to see how the tracer moves through your heart. This test is usually done at a hospital and takes 2-4 hours. Tell a doctor about: Any allergies you have. All medicines you are taking, including vitamins, herbs, eye drops, creams, and over-the-counter medicines. Any problems you or family members have had with anesthetic medicines. Any blood disorders you have. Any surgeries you have had. Any medical conditions you have. Whether you are pregnant or may be pregnant. What are the risks? Generally, this is a safe test. However, problems may occur, such  as: Serious chest pain and heart attack. This is only a risk if the stress portion of the test is done. Rapid heartbeat. A feeling of warmth in your chest. This feeling usually does not last long. Allergic reaction to the tracer. What happens before the test? Ask your doctor about changing or stopping your normal medicines. This is important. Follow instructions from your doctor about what you cannot eat or drink. Remove your jewelry on the day of the test. What happens during the test? An IV tube will be inserted into one of your veins. Your doctor will give you a small amount of tracer through the IV tube. You will wait for 20-40 minutes while the tracer moves through your bloodstream. Your heart will be monitored with an electrocardiogram (ECG). You will lie down on an exam table. Pictures of your heart will be taken for about 15-20 minutes. You may also have a stress test. For this test, one of these things may be done: You will be asked to exercise on a treadmill or a stationary bike. You will be given medicines that will make your heart work harder. This is done if you are unable to exercise. When blood flow to your heart has peaked, a  tracer will again be given through the IV tube. After 20-40 minutes, you will get back on the exam table. More pictures will be taken of your heart. Depending on the tracer that is used, more pictures may need to be taken 3-4 hours later. Your IV tube will be removed when the test is over. The test may vary among doctors and hospitals. What happens after the test? Ask your doctor: Whether you can return to your normal schedule, including diet, activities, and medicines. Whether you should drink more fluids. This will help to remove the tracer from your body. Drink enough fluid to keep your pee (urine) pale yellow. Ask your doctor, or the department that is doing the test: When will my results be ready? How will I get my results? Summary A cardiac  nuclear scan is a test that is done to check the flow of blood to your heart. Tell your doctor whether you are pregnant or may be pregnant. Before the test, ask your doctor about changing or stopping your normal medicines. This is important. Ask your doctor whether you can return to your normal activities. You may be asked to drink more fluids. This information is not intended to replace advice given to you by your health care provider. Make sure you discuss any questions you have with your health care provider. Document Revised: 12/18/2018 Document Reviewed: 02/11/2018 Elsevier Patient Education  2021 [employer].    Echocardiogram An echocardiogram is a test that uses sound waves (ultrasound) to produce images of the heart. Images from an echocardiogram can provide important information about: Heart size and shape. The size and thickness and movement of your heart's walls. Heart muscle function and strength. Heart valve function or if you have stenosis. Stenosis is when the heart valves are too narrow. If blood is flowing backward through the heart valves (regurgitation). A tumor or infectious growth around the heart valves. Areas of heart muscle that are not working well because of poor blood flow or injury from a heart attack. Aneurysm detection. An aneurysm is a weak or damaged part of an artery wall. The wall bulges out from the normal force of blood pumping through the body. Tell a health care provider about: Any allergies you have. All medicines you are taking, including vitamins, herbs, eye drops, creams, and over-the-counter medicines. Any blood disorders you have. Any surgeries you have had. Any medical conditions you have. Whether you are pregnant or may be pregnant. What are the risks? Generally, this is a safe test. However, problems may occur, including an allergic reaction to dye (contrast) that may be used during the test. What happens before the test? No specific  preparation is needed. You may eat and drink normally. What happens during the test? You will take off your clothes from the waist up and put on a hospital gown. Electrodes or electrocardiogram (ECG)patches may be placed on your chest. The electrodes or patches are then connected to a device that monitors your heart rate and rhythm. You will lie down on a table for an ultrasound exam. A gel will be applied to your chest to help sound waves pass through your skin. A handheld device, called a transducer, will be pressed against your chest and moved over your heart. The transducer produces sound waves that travel to your heart and bounce back (or "echo" back) to the transducer. These sound waves will be captured in real-time and changed into images of your heart that can be viewed on a video  monitor. The images will be recorded on a computer and reviewed by your health care provider. You may be asked to change positions or hold your breath for a short time. This makes it easier to get different views or better views of your heart. In some cases, you may receive contrast through an IV in one of your veins. This can improve the quality of the pictures from your heart. The procedure may vary among health care providers and hospitals.    What can I expect after the test? You may return to your normal, everyday life, including diet, activities, and medicines, unless your health care provider tells you not to do that. Follow these instructions at home: It is up to you to get the results of your test. Ask your health care provider, or the department that is doing the test, when your results will be ready. Keep all follow-up visits. This is important. Summary An echocardiogram is a test that uses sound waves (ultrasound) to produce images of the heart. Images from an echocardiogram can provide important information about the size and shape of your heart, heart muscle function, heart valve function, and other  possible heart problems. You do not need to do anything to prepare before this test. You may eat and drink normally. After the echocardiogram is completed, you may return to your normal, everyday life, unless your health care provider tells you not to do that. This information is not intended to replace advice given to you by your health care provider. Make sure you discuss any questions you have with your health care provider. Document Revised: 04/20/2020 Document Reviewed: 04/20/2020 Elsevier Patient Education  2021 [employer].

## 2021-07-26 ENCOUNTER — Encounter: Payer: Medicare HMO | Admitting: Physical Therapy

## 2021-07-26 NOTE — Telephone Encounter (Signed)
Left VM for pt to call back.

## 2021-07-28 ENCOUNTER — Telehealth: Payer: Self-pay

## 2021-07-28 DIAGNOSIS — I10 Essential (primary) hypertension: Secondary | ICD-10-CM | POA: Diagnosis not present

## 2021-07-28 DIAGNOSIS — M461 Sacroiliitis, not elsewhere classified: Secondary | ICD-10-CM | POA: Diagnosis not present

## 2021-07-28 DIAGNOSIS — M7062 Trochanteric bursitis, left hip: Secondary | ICD-10-CM | POA: Diagnosis not present

## 2021-07-28 DIAGNOSIS — M47816 Spondylosis without myelopathy or radiculopathy, lumbar region: Secondary | ICD-10-CM | POA: Diagnosis not present

## 2021-07-28 NOTE — Telephone Encounter (Signed)
Spoke with the patient, detailed instructions given. He stated that he would be here for his test. Asked to call back with any questions. S.Clevland Cork EMTP 

## 2021-07-28 NOTE — Telephone Encounter (Signed)
Pt aware if phone number to callback regarding cost of zio.

## 2021-07-29 ENCOUNTER — Other Ambulatory Visit: Payer: Self-pay

## 2021-07-29 ENCOUNTER — Ambulatory Visit (INDEPENDENT_AMBULATORY_CARE_PROVIDER_SITE_OTHER): Payer: Medicare HMO | Admitting: Physical Therapy

## 2021-07-29 DIAGNOSIS — M6281 Muscle weakness (generalized): Secondary | ICD-10-CM | POA: Diagnosis not present

## 2021-07-29 DIAGNOSIS — R42 Dizziness and giddiness: Secondary | ICD-10-CM

## 2021-07-29 DIAGNOSIS — R2681 Unsteadiness on feet: Secondary | ICD-10-CM | POA: Diagnosis not present

## 2021-07-29 DIAGNOSIS — H8111 Benign paroxysmal vertigo, right ear: Secondary | ICD-10-CM | POA: Diagnosis not present

## 2021-07-29 DIAGNOSIS — R2689 Other abnormalities of gait and mobility: Secondary | ICD-10-CM

## 2021-07-29 NOTE — Therapy (Addendum)
[facility] [place], [place], [address] Phone: [phone]   Fax:  [phone]  Physical Therapy Treatment, Hold and Discharge  Patient Details  Name: [name] Pratt MRN: [id number] Date of Birth: [birth year] Referring Provider (PT): [name], NP  PHYSICAL THERAPY DISCHARGE SUMMARY  Visits from Start of Care: 20  Current functional level related to goals / functional outcomes: See below   Remaining deficits: See below   Education / Equipment: See below   Patient agrees to discharge. Patient goals were partially met. Patient is being discharged due to a change in medical status.   Encounter Date: 07/29/2021   PT End of Session - 07/29/21 0933     Visit Number 20    Number of Visits 24    Date for PT Re-Evaluation 08/15/21    Authorization Type [name]    Authorization - Visit Number 20    Progress Note Due on Visit 20    PT Start Time 0935    PT Stop Time 1015    PT Time Calculation (min) 40 min    Equipment Utilized During Treatment Gait belt    Activity Tolerance Patient tolerated treatment well    Behavior During Therapy [facility] for tasks assessed/performed             Past Medical History:  Diagnosis Date   Allergic rhinitis 02/02/2016   Anemia 12/17/2009   Formatting of this note might be different from the original. Anemia  10/1 IMO update   Aortic atherosclerosis ([place]) 03/05/2020   Arthritis    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   Atypical chest pain 11/24/2015   Benign essential hypertension 05/13/2009   Qualifier: Diagnosis of  By: Larwance Sachs of this note might be different from the original. Hypertension   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[place]) 07/22/2021   Body mass index (BMI) 25.0-25.9, adult 11/02/2020   C. difficile colitis 04/11/2021   Cancer ([place])    skin cancer   Carotid stenosis    a. Carotid U/S 6/96: LICA < 29%, RICA  52-84%;  b.  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   Cystoid macular edema of both eyes 07/28/2020   Degenerative disc disease, lumbar 06/30/2015   Deviated nasal septum 02/18/2015   Diverticulosis 03/03/2020   DJD (degenerative joint disease) of hip 12/24/2012   Dyspnea 06/12/2011   Emphysema, unspecified ([place]) 03/05/2020   Erectile dysfunction 06/20/2017   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[place]) 02/09/2021   Gait disorder 03/02/2021   GERD (gastroesophageal reflux disease)    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   Qualifier: Diagnosis of  By: Burnett Kanaris     Greater trochanteric pain syndrome 07/22/2021   Hearing loss 02/02/2016   Heme positive stool 02/29/2020   HIATAL HERNIA 05/13/2009   Qualifier: Diagnosis of  By: Burnett Kanaris     High risk medication use 01/18/2012   Hip pain 03/02/2021   History of chicken pox    History of hemorrhoids    History of kidney stones    History of total bilateral knee replacement 07/08/2020   Hyperglycemia 03/03/2014  Hyperlipidemia    under control   Hyperlipidemia with target LDL less than 130 12/27/2010   Formatting of this note might be different from the original. Cardiology - Dr Frances Nickels at [facility] cardiology  ICD-10 cut over   Hypertension    under control   Hypokalemia 07/08/2021   Hypothyroidism    Ileus ([place])    Internal hemorrhoids 03/03/2020   Leg cramps 11/13/2019   Lower GI bleed 12/12/2012   Lumbago of lumbar region with sciatica 10/21/2020   Lumbar radiculopathy 03/02/2021   Lumbar spondylosis 11/02/2020   Nasal congestion 04/11/2021   NEPHROLITHIASIS 05/13/2009   Qualifier: Diagnosis of   By: Burnett Kanaris     Nonspecific abnormal electrocardiogram (ECG) (EKG) 01/06/2013   Onychomycosis 06/30/2015   Osteoarthrosis, 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 Osteoarthritis Of The Hand  10/1 IMO update   Osteoarthrosis, unspecified whether generalized or localized, pelvic region and thigh 07/25/2010   Formatting of this note might be different from the original. Osteoarthritis Of The Hip   Other abnormal glucose 07/22/2021   Other allergic rhinitis 02/18/2015   Other ill-defined and unknown causes of morbidity and mortality 09/12/1999   Nov 20, 2000 Entered By: Alycia Patten Comment: ltFeb 23, 2006 Entered By: Alycia Patten Comment: rt 11/05   Peripheral neuropathy 06/19/2016   PONV (postoperative nausea and vomiting)    Preventative health care 11/24/2015   Right groin pain 11/29/2012   Right inguinal hernia 05/13/2009   Qualifier: Diagnosis of  By: Burnett Kanaris     RLS (restless legs syndrome) 02/18/2015   Rosacea    Rotator cuff tear 07/27/2013   Situational depression 03/02/2021   Sleep apnea with use of continuous positive airway pressure (CPAP) 07/15/2013   CPAP set to  7 cm water,  Residual AHi was 7.8  With no leak.  User time 6 hours.  479 days , not used only 12 days - highly compliant 06-23-13  .    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   Urinary retention 11/26/2020   Ventral hernia 07/08/2012   Vertigo 07/08/2021   Weight loss 03/03/2020    Past Surgical History:  Procedure Laterality Date   ABDOMINAL HERNIA REPAIR  07/15/12   Dr [name]   ANTERIOR CERVICAL DECOMP/DISCECTOMY FUSION N/A 07/09/2020   Procedure: ANTERIOR CERVICAL  DECOMPRESSION AND FUSION CERVICAL THREE-FOUR.;  Surgeon: [name], MD;  Location: [facility];  Service: Neurosurgery;  Laterality: N/A;  anterior   BROW LIFT  05/07/01   CYSTOSCOPY WITH URETEROSCOPY AND STENT PLACEMENT Right 01/28/2014   Procedure: CYSTOSCOPY WITH URETEROSCOPY, BASKET RETRIVAL AND  STENT PLACEMENT;  Surgeon: [name], MD;  Location: [facility];  Service: Urology;  Laterality: Right;   epidural injections     multiple procedures   ESOPHAGEAL DILATION  1993 and 1994   multiple times   EYE SURGERY Bilateral 03/25/10, 2012   cataract removal   HEMORRHOID SURGERY  2014   HIATAL HERNIA REPAIR  12/21/93   HOLMIUM LASER APPLICATION Right 1/66/0630   Procedure: HOLMIUM LASER APPLICATION;  Surgeon: [name], MD;  Location: [facility];  Service: Urology;  Laterality: Right;   INGUINAL HERNIA REPAIR Right 07/15/12  Dr [name], x2   JOINT REPLACEMENT  07/25/04   right knee   JOINT REPLACEMENT  07/13/10   left knee   [place]  09/20/06   with intraoperative cholangiogram and right inguinal herniorrhaphy with mesh    LIGAMENT REPAIR Left 07/2013   shoulder   LITHOTRIPSY     x2   [place] FUSION/FORAMINOTOMY N/A 11/24/2020   Procedure: Posterior Cervical Laminectomy Cervical three-four, Cervical four-five with lateral mass fusion;  Surgeon: [name], MD;  Location: [facility];  Service: Neurosurgery;  Laterality: N/A;   REFRACTIVE SURGERY Bilateral    Right total hip replacement  4/14   SKIN CANCER DESTRUCTION     nose, ear   TONSILLECTOMY  as child   TOTAL HIP ARTHROPLASTY Left    URETHRAL DILATION  1991   [name]    There were no vitals filed for this visit.   Subjective Assessment - 07/29/21 0939     Subjective Pt has been wearing heart monitor and get cardiac testing next week. "I felt fantastic yesterday!" Pt notes no spinning sensation or dizziness just some lightheadedness.    Pertinent  History spondylitic myelopathy, ACDF, HTN, COPD, cervical decompression, sleep apnea    Patient Stated Goals "walking good and have good balance."    Currently in Pain? No/denies                [facility] PT Assessment - 07/29/21 0001       Assessment   Medical Diagnosis gait disorder    Referring Provider (PT) [name], NP    Onset Date/Surgical Date 11/14/20    Hand Dominance Right      Berg Balance Test   Sit to Stand Able to stand without using hands and stabilize independently    Standing Unsupported Able to stand safely 2 minutes    Sitting with Back Unsupported but Feet Supported on Floor or Stool Able to sit safely and securely 2 minutes    Stand to Sit Sits safely with minimal use of hands    Transfers Able to transfer safely, minor use of hands    Standing Unsupported with Eyes Closed Able to stand 10 seconds safely    Standing Unsupported with Feet Together Able to place feet together independently and stand 1 minute safely    From Standing, Reach Forward with Outstretched Arm Can reach forward >12 cm safely (5")    From Standing Position, Pick up Object from Floor Able to pick up shoe safely and easily    From Standing Position, Turn to Look Behind Over each Shoulder Turn sideways only but maintains balance    Turn 360 Degrees Able to turn 360 degrees safely but slowly    Standing Unsupported, Alternately Place Feet on Step/Stool Able to complete 4 steps without aid or supervision    Standing Unsupported, One Foot in Front Able to plae foot ahead of the other independently and hold 30 seconds    Standing on One Leg Tries to lift leg/unable to hold 3 seconds but remains standing independently    Total Score 45    Berg comment: 45/56                           [facility] Adult PT Treatment/Exercise - 07/29/21 0001       Ambulation/Gait   Ambulation Distance (Feet) 80 Feet    Assistive device  Straight cane      Knee/Hip Exercises: Stretches    Piriformis Stretch Right;Left;2 reps;30 seconds    Gastroc Stretch Right;Left;1 rep;30 seconds    Soleus Stretch Right;Left;30 seconds    Other Knee/Hip Stretches Figure 4 stretch 2x30 sec      Knee/Hip Exercises: Aerobic   Nustep L5 x 5 min LEs only      Knee/Hip Exercises: Standing   Forward Step Up Left;Step Height: 4";2 sets;10 reps;Hand Hold: 0    Other Standing Knee Exercises Backwards walking x8 by the counter                          PT Long Term Goals - 07/29/21 1017       PT LONG TERM GOAL #1   Title The patient will be indep with HEP.    Time 6    Period Weeks    Status Achieved      PT LONG TERM GOAL #2   Title The patient will improve Berg balance scale from 36/56 to 40/56    Baseline 45/56 on 07/29/21    Time 6    Period Weeks    Status Achieved      PT LONG TERM GOAL #3   Title The patient will improve gait speed from 2.5 ft/sec to > or equal to 2.62 ft/sec to demo improved community moiblity.    Baseline 2.51 ft/sec :06/27/21    Time 6    Period Weeks    Status Partially Met      PT LONG TERM GOAL #4   Title Pt will maintain TUG at 14.5 seconds to demo maintained mobility    Baseline 18.15 without cane, 11.49 with cane: 07/18/21    Time 6    Period Weeks    Status Partially Met      PT LONG TERM GOAL #5   Title Pt will demo no s/s of BPPV in all positions of canalith testing    Time 6    Period Weeks    Status Achieved                   Plan - 07/29/21 1100     Clinical Impression Statement Rechecked pt's goals. Pt has met or partially met most of his goals. His balance score has improved since last assessment. Continues to demo gross hip weakness. Pt getting a work up on his heart at this time -- discussed with pt holding therapy to make sure his heart is good first and then resume again beginning of next year when his insurance resets. Provided pt final HEP to work on at home for hip strength and balance. Pt verbalizes  good understanding.    Personal Factors and Comorbidities Comorbidity 3+;Time since onset of injury/illness/exacerbation    Comorbidities HTN, COPD, spondylitis myelopathy, falls    Examination-Activity Limitations Squat;Stairs;Locomotion Level    Examination-Participation Restrictions Yard Work;Community Activity    Rehab Potential Good    PT Frequency 1x / week    PT Duration 6 weeks    PT Treatment/Interventions ADLs/Self Care Home Management;Patient/family education;Balance training;Neuromuscular re-education;Gait training;Therapeutic activities;Therapeutic exercise;Functional mobility training;Taping;Manual techniques;Vestibular    PT Next Visit Plan Pt to return next year.    PT Home Exercise Plan Access Code: [email or web address]    Consulted and Agree with Plan of Care Patient             Patient will benefit from skilled therapeutic intervention in order to  improve the following deficits and impairments:  Pain, Decreased activity tolerance, Decreased balance, Decreased strength, Decreased range of motion, Impaired flexibility, Impaired tone, Increased fascial restricitons  Visit Diagnosis: Muscle weakness (generalized)  Unsteadiness on feet  Other abnormalities of gait and mobility  BPPV (benign paroxysmal positional vertigo), right  Dizziness and giddiness     Problem List Patient Active Problem List   Diagnosis Date Noted   Palpitations 07/25/2021   Cardiac murmur 07/25/2021   History of chicken pox 07/22/2021   History of hemorrhoids 07/22/2021   Hypertension 07/22/2021   COPD (chronic obstructive pulmonary disease) ([place]) 07/22/2021   History of kidney stones 07/22/2021   Cancer ([place]) 81/44/8185   Complication of anesthesia 07/22/2021   Ileus ([place]) 07/22/2021   PONV (postoperative nausea and vomiting) 07/22/2021   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[place]) 07/22/2021   Other abnormal glucose 07/22/2021   Spondylitis ([place]) 07/22/2021   Greater trochanteric pain syndrome  07/22/2021   Vertigo 07/08/2021   Hypokalemia 07/08/2021   Trochanteric bursitis of left hip 06/30/2021   Nasal congestion 04/11/2021   C. 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[place]) 02/09/2021   Urinary retention 11/26/2020   Cervical myelopathy ([place]) 11/24/2020   Body mass index (BMI) 25.0-25.9, adult 11/02/2020   Lumbar spondylosis 11/02/2020   Lumbago of lumbar region with sciatica 10/21/2020   Cystoid macular edema of both eyes 07/28/2020   Status post cervical spinal fusion 07/09/2020   History of total bilateral knee replacement 07/08/2020   Aortic atherosclerosis ([place]) 03/05/2020   Emphysema, unspecified ([place]) 03/05/2020   Arthritis 03/04/2020   Diverticulosis 03/03/2020   Internal hemorrhoids 03/03/2020   Weight loss 03/03/2020   Heme positive stool 02/29/2020   Leg cramps 11/13/2019   Tibialis anterior tendon tear, nontraumatic 11/13/2019   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   CPAP (continuous positive airway pressure) dependence 03/25/2018   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   Erectile dysfunction 06/20/2017   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 03/06/2017   Peripheral neuropathy 06/19/2016   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   Hearing loss 02/02/2016   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   Allergic rhinitis 02/02/2016   Atypical chest pain 11/24/2015   Preventative health care 11/24/2015   Onychomycosis 06/30/2015   Degenerative disc disease, lumbar 06/30/2015   Other allergic rhinitis 02/18/2015   Deviated nasal septum 02/18/2015   RLS (restless legs syndrome) 02/18/2015   GERD (gastroesophageal reflux disease) 09/18/2014   Hyperglycemia 03/03/2014   Rotator cuff tear 07/27/2013    Sleep apnea with use of continuous positive airway pressure (CPAP) 07/15/2013   Carotid stenosis 02/24/2013   Nonspecific abnormal electrocardiogram (ECG) (EKG) 01/06/2013   DJD (degenerative joint disease) of hip 12/24/2012   Lower GI bleed 12/12/2012   Right groin pain 11/29/2012   Ventral hernia 07/08/2012   High risk medication use 01/18/2012   Dyspnea 06/12/2011   Hyperlipidemia with target LDL less than 130 12/27/2010   Osteoarthrosis, unspecified whether generalized or localized, pelvic region and thigh 07/25/2010   Osteoarthrosis, hand 06/13/2010   Anemia 12/17/2009   Hypothyroidism 05/13/2009   Hyperlipidemia 05/13/2009   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   Benign essential hypertension 05/13/2009   Right inguinal hernia 05/13/2009   HIATAL HERNIA 05/13/2009   NEPHROLITHIASIS 05/13/2009   ROSACEA 05/13/2009   Other ill-defined and  unknown causes of morbidity and mortality 09/12/1999    [facility] [place] April Gordy Levan, [place], DPT 07/29/2021, 12:55 PM  [facility] [place], [place], [address] Phone: [phone]   Fax:  [phone]  Name: [name] Pratt MRN: [id number] Date of Birth: 01-Mar-1941

## 2021-08-01 ENCOUNTER — Ambulatory Visit (INDEPENDENT_AMBULATORY_CARE_PROVIDER_SITE_OTHER): Payer: Medicare HMO | Admitting: Pharmacist

## 2021-08-01 VITALS — BP 126/60 | HR 70

## 2021-08-01 DIAGNOSIS — I10 Essential (primary) hypertension: Secondary | ICD-10-CM

## 2021-08-01 DIAGNOSIS — I700 Atherosclerosis of aorta: Secondary | ICD-10-CM

## 2021-08-01 DIAGNOSIS — E782 Mixed hyperlipidemia: Secondary | ICD-10-CM

## 2021-08-01 DIAGNOSIS — M1A0720 Idiopathic chronic gout, left ankle and foot, without tophus (tophi): Secondary | ICD-10-CM

## 2021-08-01 NOTE — Chronic Care Management (AMB) (Signed)
Chronic Care Management Pharmacy Note  08/01/2021 Name:  [name] Pratt MRN:  [id number] DOB:  25-Mar-1941  Summary: Updated medication list. Reviewed all medications with patient.  Reviewed home blood pressure readings - blood pressure improving with increased dose of amlodipine to 76m daily. He is not taking hydralazine 547mtid - only bid.  Patient states Restasis cost increased in Medicare coverage gap.   Recommendations/Changes made from today's visit: Continue current blood pressure regimen.   Provided application for patient assistance for Restasis.    Subjective: [name] Pratt an [age].o. year old male who is a primary patient of [name] Pratt.  The CCM team was consulted for assistance with disease management and care coordination needs.    Engaged with patient by telephone for follow up visit in response to provider referral for pharmacy case management and/or care coordination services.   Consent to Services:  The patient was given information about Chronic Care Management services, agreed to services, and gave verbal consent prior to initiation of services.  Please see initial visit note for detailed documentation.   Patient Care Team: [name] Pratt as PCP - General (Internal Medicine) [name] Pratt as PCP - Cardiology (Cardiology) [name] Pratt as Consulting Physician (Ophthalmology) [name] Pratt as Consulting Physician (Cardiology) [name] Pratt, [name] Pratt as Consulting Physician (Neurology) [name] Pratt as Consulting Physician (Pulmonary Disease) [name] Pratt as Consulting Physician (Ophthalmology) [name] Pratt as Consulting Physician (Dentistry) [name] RobinsRPBrookstonPharmacist)  Recent office visits: 07/08/2021 - Internal Med (OInda Pratt) Seen for ED follow up / HTN. No med chnages; updated med list wiht amlodipine 1044maily.  Recent consult visits: 07/28/2021 - Neurosurgeon ([name]  [name] Pratt for lumbar sponsulosis, scarolitis and trochanteric bursitis of left hip. 07/25/2021 - Cardio ([name] Pratt for palpitations, chest pain and HTN. Ordered stress test, ECHO and 2 week heart monitor. Updated med list and removed cholestyramine, cranberr, magnesium and omega 2 FA due to patient not taking.  07/21/2021 - Ophthalmology ([name] [name] Pratt for macular degeneration. Received bevacizumab intravitreal injection in right eye. RTC 6 weeks.  Hospital visits: 06/24/2021 - ED Visit for heachache, dizziness and HTN.  Increased amlodipine from 5 to 29m56mily. 06/14/2021 - ED Visit for vertigo. Referred to ED after episode of dizziness during physical therapy. Prescribed meclizine 25mg75mto 3 times a day as needed.   Objective:  Lab Results  Component Value Date   CREATININE 1.43 07/08/2021   CREATININE 1.15 06/24/2021   CREATININE 1.22 06/15/2021    Lab Results  Component Value Date   HGBA1C 5.7 12/29/2020   Last diabetic Eye exam: No results found for: HMDIABEYEEXA  Last diabetic Foot exam: No results found for: HMDIABFOOTEX      Component Value Date/Time   CHOL 106 06/15/2020 1235   TRIG 98 06/15/2020 1235   HDL 38 (L) 06/15/2020 1235   CHOLHDL 2.8 06/15/2020 1235   VLDL 14.2 09/19/2019 1018   LDLCALC 50 06/15/2020 1235    Hepatic Function Latest Ref Rng & Units 06/24/2021 06/15/2021 11/30/2020  Total Protein 6.5 - 8.1 g/dL 7.1 7.5 6.8  Albumin 3.5 - 5.0 g/dL 3.7 4.2 3.2(L)  AST 15 - 41 U/L _0 ALT 0 - 44 U/L _1 Alk Phosphatase 38 - 126 U/L 66 73 77  Total Bilirubin 0.3 - 1.2 mg/dL 0.6 0.5 0.9  Bilirubin, Direct 0.0 - 0.3 mg/dL - - -  Lab Results  Component Value Date/Time   TSH 2.77 06/15/2021 12:50 PM   TSH 0.46 12/29/2020 11:08 AM   FREET4 0.94 06/15/2021 12:50 PM    CBC Latest Ref Rng & Units 06/24/2021 06/15/2021 06/14/2021  WBC 4.0 - 10.5 K/uL 6.4 6.2 8.2  Hemoglobin 13.0 - 17.0 g/dL 12.2(L) 12.8(L) 12.8(L)  Hematocrit 39.0 -  52.0 % 36.9(L) 39.2 38.4(L)  Platelets 150 - 400 K/uL 320 324.0 303    No results found for: VD25OH  Clinical ASCVD: Yes  The ASCVD Risk score (Arnett DK, et al., 2019) failed to calculate for the following reasons:   The 2019 ASCVD risk score is only valid for ages 26 to 71     Social History   Tobacco Use  Smoking Status Former   Years: 11.00   Types: Cigarettes   Quit date: 09/11/1978   Years since quitting: 42.9  Smokeless Tobacco Never   BP Readings from Last 3 Encounters:  08/01/21 126/60  07/25/21 (!) 142/62  07/08/21 (!) 158/62   Pulse Readings from Last 3 Encounters:  08/01/21 70  07/25/21 67  07/08/21 64   Wt Readings from Last 3 Encounters:  07/25/21 161 lb (73 kg)  07/08/21 159 lb (72.1 kg)  06/24/21 150 lb (68 kg)    Assessment: Review of patient past medical history, allergies, medications, health status, including review of consultants reports, laboratory and other test data, was performed as part of comprehensive evaluation and provision of chronic care management services.   SDOH:  (Social Determinants of Health) assessments and interventions performed:  SDOH Interventions    Flowsheet Row Most Recent Value  SDOH Interventions   Financial Strain Interventions Other (Comment)  [sent patient application for Restasis PAP]  Physical Activity Interventions [name] Pratt  [physical therapy for vestibular therapy.]  Transportation Interventions Intervention Not Indicated       CCM Care Plan  Allergies  Allergen Reactions   Pravastatin Other (See Comments)    Muscle cramps   Sildenafil Other (See Comments)    Tachycardia     Oxycodone     hallucinations   Atenolol Other (See Comments)    bradycardia   Elastic Bandages & [Zinc] Other (See Comments)    Teflon bandages - Irritation   Metoclopramide Hcl Anxiety    Medications Reviewed Today     Reviewed by [name] Pratt, RPH-CPP (Pharmacist) on 08/01/21 at [facility]  Med List  Status: <None>   Medication Order Taking? Sig Documenting Provider Last Dose Status Informant  acetaminophen (TYLENOL) 650 MG CR tablet [id number] Yes Take 1,300 mg by mouth every morning. And take 650 mg at bedtime [provider] Taking Active Self  albuterol (VENTOLIN HFA) 108 (90 Base) MCG/ACT inhaler [id number] Yes Inhale 2 puffs into the lungs every 6 (six) hours as needed for shortness of breath. [name], NP Taking Active Self  Alcohol Swabs (ALCOHOL PADS) 70 % PADS [id number] Yes 1 packet by Does not apply route 2 (two) times daily. [name], NP Taking Active   allopurinol (ZYLOPRIM) 100 MG tablet [id number] Yes Take 100 mg by mouth 2 (two) times daily. [provider] Taking Active   amLODipine (NORVASC) 10 MG tablet [id number] Yes Take 1 tablet (10 mg total) by mouth daily. [name], NP Taking Active   aspirin EC 81 MG tablet [id number] Yes Take 81 mg by mouth every morning. [provider] Taking Active Self  Cholecalciferol (VITAMIN D) 50 MCG (2000 UT) CAPS [id number] Yes Take 2,000 Units  by mouth daily. [provider] Taking Active Self  colchicine 0.6 MG tablet [id number] Yes Take 0.6 mg by mouth as needed for other. For gout [provider] Taking Active   cycloSPORINE (RESTASIS) 0.05 % ophthalmic emulsion [id number] Yes Place 1 drop into both eyes 2 (two) times daily. [provider] Taking Active Self  diclofenac Sodium (VOLTAREN) 1 % GEL [id number] Yes Apply 1 application topically daily as needed for pain (Hand pain).  Taking Active Self  fluocinonide (LIDEX) 0.05 % external solution [id number] Yes Apply 1 application topically 2 (two) times daily as needed for itching or pain. [provider] Taking Active   gabapentin (NEURONTIN) 300 MG capsule [id number] Yes Take 300 mg by mouth 2 (two) times daily as needed. Leg and nerve pain [provider] Taking Active   glucose blood test strip  [id number] Yes TRUE Metrix Blood Glucose Test Strip, use as instructed [name], NP Taking Active   Homeopathic Products ([place]) FOAM [id number] Yes Apply 1 application topically 2 (two) times daily as needed (Pain). [provider] Taking Active Self  hydrALAZINE (APRESOLINE) 50 MG tablet [id number] Yes Take 50 mg by mouth 2 (two) times daily. [provider] Taking Active   Lancets [place] [id number] Yes 1 each by Does not apply route 2 (two) times daily. TRUE matrix lancets [name], NP Taking Active   levothyroxine (SYNTHROID) 75 MCG tablet [id number] Yes TAKE 2 TABS BY MOUTH ONCE DAILY 6 DAYS A WEEK AND 1 TAB BY MOUTH ONCE DAILY ONE DAY A WEEK [name], NP Taking Active   losartan (COZAAR) 100 MG tablet [id number] Yes Take 1 tablet (100 mg total) by mouth daily. [name], NP Taking Active   meclizine (ANTIVERT) 25 MG tablet [id number] Yes Take 1 tablet (25 mg total) by mouth 3 (three) times daily as needed for dizziness. Malvin Johns, MD Taking Active   mirabegron ER (MYRBETRIQ) 25 MG TB24 tablet [id number] No Take 25 mg by mouth daily. [provider] Unknown Active            Med Note Barbaraann Boys Aug 01, 2021  9:41 AM) Patient given sample. He was not sure that is was still Myrbetriq.  multivitamin [facility]) per tablet [id number] Yes Take 1 tablet by mouth daily. [provider] Taking Active Self  nitroGLYCERIN (NITROSTAT) 0.4 MG SL tablet [id number] Yes Place 1 tablet (0.4 mg total) under the tongue every 5 (five) minutes as needed for chest pain. [name] Hector, MD Taking Active Self  polyethylene glycol (MIRALAX / GLYCOLAX) 17 g packet [id number] Yes Take 17-34 g by mouth daily as needed. [provider] Taking Active   rosuvastatin (CRESTOR) 5 MG tablet [id number] Yes Take 5 mg by mouth daily. [provider] Taking Active   sodium chloride (OCEAN) 0.65 % nasal spray [id number] Yes  Place 1 spray into both nostrils at bedtime as needed for congestion. [provider] Taking Active Self            Patient Active Problem List   Diagnosis Date Noted   Palpitations 07/25/2021   Cardiac murmur 07/25/2021   History of chicken pox 07/22/2021   History of hemorrhoids 07/22/2021   Hypertension 07/22/2021   COPD (chronic obstructive pulmonary disease) ([place]) 07/22/2021   History of kidney stones 07/22/2021   Cancer ([place]) 11/57/2620   Complication of anesthesia 07/22/2021   Ileus ([place]) 07/22/2021   PONV (postoperative nausea and vomiting) 07/22/2021   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  [facility]) 07/22/2021   Other abnormal glucose 07/22/2021   Spondylitis ([place]) 07/22/2021   Greater trochanteric pain syndrome 07/22/2021   Vertigo 07/08/2021   Hypokalemia 07/08/2021   Trochanteric bursitis of left hip 06/30/2021   Nasal congestion 04/11/2021   C. 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[place]) 02/09/2021   Urinary retention 11/26/2020   Cervical myelopathy ([facility]) 11/24/2020   Body mass index (BMI) 25.0-25.9, adult 11/02/2020   Lumbar spondylosis 11/02/2020   Lumbago of lumbar region with sciatica 10/21/2020   Cystoid macular edema of both eyes 07/28/2020   Status post cervical spinal fusion 07/09/2020   History of total bilateral knee replacement 07/08/2020   Aortic atherosclerosis ([place]) 03/05/2020   Emphysema, unspecified ([place]) 03/05/2020   Arthritis 03/04/2020   Diverticulosis 03/03/2020   Internal hemorrhoids 03/03/2020   Weight loss 03/03/2020   Heme positive stool 02/29/2020   Leg cramps 11/13/2019   Tibialis anterior tendon tear, nontraumatic 11/13/2019   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   CPAP (continuous positive airway pressure)  dependence 03/25/2018   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   Erectile dysfunction 06/20/2017   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 03/06/2017   Peripheral neuropathy 06/19/2016   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   Hearing loss 02/02/2016   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   Allergic rhinitis 02/02/2016   Atypical chest pain 11/24/2015   Preventative health care 11/24/2015   Onychomycosis 06/30/2015   Degenerative disc disease, lumbar 06/30/2015   Other allergic rhinitis 02/18/2015   Deviated nasal septum 02/18/2015   RLS (restless legs syndrome) 02/18/2015   GERD (gastroesophageal reflux disease) 09/18/2014   Hyperglycemia 03/03/2014   Rotator cuff tear 07/27/2013   Sleep apnea with use of continuous positive airway pressure (CPAP) 07/15/2013   Carotid stenosis 02/24/2013   Nonspecific abnormal electrocardiogram (ECG) (EKG) 01/06/2013   DJD (degenerative joint disease) of hip 12/24/2012   Lower GI bleed 12/12/2012   Right groin pain 11/29/2012   Ventral hernia 07/08/2012   High risk medication use 01/18/2012   Dyspnea 06/12/2011   Hyperlipidemia with target LDL less than 130 12/27/2010   Osteoarthrosis, unspecified whether generalized or localized, pelvic region and thigh 07/25/2010   Osteoarthrosis, hand 06/13/2010   Anemia 12/17/2009   Hypothyroidism 05/13/2009   Hyperlipidemia 05/13/2009   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   Benign essential hypertension 05/13/2009   Right inguinal hernia 05/13/2009   HIATAL HERNIA 05/13/2009   NEPHROLITHIASIS 05/13/2009   ROSACEA 05/13/2009   Other ill-defined and unknown causes of morbidity and mortality 09/12/1999    Immunization History  Administered Date(s) Administered   Fluad Quad(high Dose 65+) 05/29/2019, 05/28/2020, 06/24/2021   H1N1 08/20/2008   Influenza Split 05/17/2012   Influenza, High Dose Seasonal PF 06/19/2016, 06/20/2017, 06/17/2018   Influenza,inj,Quad PF,6+ Mos 05/26/2013,  06/03/2014, 05/28/2015   Influenza-Unspecified 10/12/2000, 06/11/2001, 06/11/2002, 10/12/2003, 08/11/2004, 07/12/2005, 06/27/2006, 07/13/2007, 06/11/2008, 08/11/2009   PFIZER(Purple Top)SARS-COV-2 Vaccination 10/01/2019, 10/22/2019, 06/15/2020   Pneumococcal Conjugate-13 08/25/2013   Pneumococcal Polysaccharide-23 09/11/2008   Pneumococcal-Unspecified 05/29/2005   Tdap 12/27/2010   Zoster Recombinat (Shingrix) 07/01/2018, 09/19/2018   Zoster, Live 07/10/2008    Conditions to be addressed/monitored: CAD, HTN, HLD, Pulmonary Disease, Gout, Hypothyroidism, Overactive Bladder, and macular edema  Care Plan : General Pharmacy (Adult)  Updates made by [name] Pratt, RPH-CPP since 08/01/2021 12:00  AM     Problem: CHL AMB "PATIENT-SPECIFIC PROBLEM"      Goal: Patient-Specific Goal   Note:   Current Barriers:  Unable to independently afford treatment regimen Unable to maintain control of hypertension   Pharmacist Clinical Goal(s):  Over the next 90 days, patient will verbalize ability to afford treatment regimen maintain control of blood pressure / HTN as evidenced by blood pressure <140/90  through collaboration with PharmD and provider.   Interventions: 1:1 collaboration with [name], NP regarding development and update of comprehensive plan of care as evidenced by provider attestation and co-signature Inter-disciplinary care team collaboration (see longitudinal plan of care) Comprehensive medication review performed; medication list updated in electronic medical record  Hypertension: Improved home blood pressure readings; BP goal is:  <130/80 Current treatment: Losartan 180m daily Amlodipine 169mdaily  Hydralazine 5058m take 3 times a day (patient only taking twice a day) Patient has difficulty remembering to take mid day dose of hydralazine so has only been taking twice a day for over a year. Current home readings: 126/60, 124/57, 121/60, 117/59 Heart rate:  70/79 Occasional dizziness and chest pain- going to vestibular physical therapy and cardiologist has ordered ECHO (08/24/2021) , stress test (08/02/2021) and 2 week heart monitor (wearing now - 08/01/2021) Denies edema since dose of amlodipine was increased 06/2021 Interventions:  Encouraged patient to continue current regimen for blood pressure Continue to check blood pressure at home 2 to 3 times per week and record for future visits  Hyperlipidemia (aortic atherosclerosis) : Controlled; Goal: LDL <70; Triglycerides <150 Current treatment: Rosuvastatin 5mg59mily Nitroglycerin 0.4mg 48mneeded Medications previously tried: None  Interventions:  Reviewed lipid goals Recommended continue current therapy to lower lipids  Emphysema:  Controlled Current treatment: Albuterol inhaler - inhaler 2 puffs into lungs up to every 6 hours as needed for shortness of breath   Last spirometry score: 12/01/2016 FVC: 3.62 (103%)  FEV1: 2.67 (106%) FEV1/FVC: 74% "Minimal airway obstruction is present suggesting small airway disease." No exacerbations requiring treatment in the last 6 months  Patient has failed these meds in past: symbicort (low efficacy) Using maintenance inhaler regularly? No (not prescribed) Frequency of rescue inhaler use:  infrequently (once a month) Interventions:  Reviewed when to use rescue inhaler Reminded to contact provider if frequency of albuterol inhaler use increases to more than twice per week.   Macular Degeneration and Dry Eyes:  Managed by [name] BevisTalbert Forestent therapy:  Avastin eye injection about every 6 weeks.  Restasis - 1 drop in both eyes twice a day Patient reports cost of Restasis is high at end of year due to Medicare coverage gap. He has enough for the next 2 months.  Interventions:  Sent application for Restasis patient assistance that he can use in 2023 if he chooses.  Continue current therapy Continue to follow up with [name]  Maintenance:  Reviewed vaccination history and discussed benefits of COVID booster and Tetanus vaccinations Patient declined COVID booster today Patient considering tetanus booster in 2023.   Medication management Pharmacist Clinical Goal(s): Over the next 90 days, patient will work with PharmD and providers to maintain optimal medication adherence Current pharmacy: [facility] Comprehensive medication review performed. Reviewed refill history and assessed adherence Continue current medication management strategy Updated medication list- added Benefiber and removed over-the-counter medications he is no longer taking.   Patient Goals/Self-Care Activities Over the next 90 days, patient will:  take medications as prescribed, check blood pressure 2  to 3 times per week, document, and provide at future appointments, and review application for Restasis assistance.   Follow Up Plan: Telephone follow up appointment with care management team member scheduled for:  3 to 6 months.           Medication Assistance:  mailed patient application for Restasis patient assistance program   Patient's preferred pharmacy is:  [facility], [address] [place] [address] Phone: [phone] Fax: [phone]  [facility] [address], [address] [place] [address] Phone: [phone] Fax: [phone]   Follow Up:  Patient agrees to Care Plan and Follow-up.  Plan: Telephone follow up appointment with care management team member scheduled for:  3 months  [name] Pratt, PharmD Clinical Pharmacist [place] [facility]

## 2021-08-01 NOTE — Patient Instructions (Signed)
Visit Information  [name] Pratt It was a pleasure speaking with you today.  I have attached a summary of our visit today and information about your health goals.   Our next appointment is by telephone on Friday 10/28/2021 at 8:30am  Please call the care guide team at [phone] if you need to cancel or reschedule your appointment.   If you have any questions or concerns, please feel free to contact me either at the phone number below or with a MyChart message.   Keep up the good work!  Cherre Robins, PharmD Clinical Pharmacist [place] [phone] (direct line)  [phone] (main office number)  The patient verbalized understanding of instructions, educational materials, and care plan provided today and agreed to receive a mailed copy of patient instructions, educational materials, and care plan.     Hypertension BP Readings from Last 3 Encounters:  08/01/21 126/60  07/25/21 (!) 142/62  07/08/21 (!) 158/62   Pharmacist Clinical Goal(s): Over the next 90 days, patient will work with PharmD and providers to maintain BP goal <130/80 Current regimen:  Hydralazine 50mg  twice daily Losartan 100mg  daily  Amlodipine 10mg  daily  Interventions: Requested patient to check blood pressure 2 to 3 times per week and document Patient self care activities - Over the next 90 days, patient will: Check blood pressure 2 to 3 times per week, document, and provide at future appointments Ensure daily salt intake < 2300 mg/day Continue current regimen for blood pressure  Hyperlipidemia/elevated cholesterol Lab Results  Component Value Date/Time   LDLCALC 50 06/15/2020 12:35 PM   Pharmacist Clinical Goal(s): Over the next 90 days, patient will work with PharmD and providers to maintain LDL goal < 70 and triglycerides <150 Current regimen:  Rosuvastatin 5mg  daily Nitroglycerin 0.4mg  as needed Interventions: Discussed LDL goals Patient self care  activities - Over the next 90 days, patient will: Maintain cholesterol medication regimen  Emphysema / breathing:  Current treatment: Albuterol inhaler - inhaler 2 puffs into lungs up to every 6 hours as needed for shortness of breath   Interventions:  Reviewed when to use rescue inhaler Contact provider if frequency of albuterol inhaler use increases to more than twice per week.   Macular Degeneration and Dry Eyes:  Managed by Dr Talbert Forest Current therapy:  Avastin eye injection about every 6 weeks.  Restasis - 1 drop in both eyes twice a day Patient reports cost of Restasis is high at end of year due to Medicare coverage gap. He has enough for the next 2 months.  Interventions:  Mailed application for Restasis patient assistance that he can use in 2023 if he chooses.  Continue current therapy Continue to follow up with Dr Talbert Forest.    Health Maintenance:  Reviewed vaccination history and discussed benefits of COVID and Tetanus vaccinations Patient declined COVID vaccination at this time Patient considering Tetanus booster for 2023.   Medication management Pharmacist Clinical Goal(s): Over the next 90 days, patient will work with PharmD and providers to maintain optimal medication adherence Current pharmacy: [facility] Comprehensive medication review performed. Continue current medication management strategy Updated medications list - added Benefiber and removed over-the-counter medications he is no longer taking.  Patient self care activities - Over the next 90 days, patient will: Focus on medication adherence by filling and taking medications appropriately  Take medications as prescribed Report any questions or concerns to PharmD and/or provider(s)  Patient Goals/Self-Care Activities Over the next 90 days, patient will:  take medications  as prescribed, check blood pressure 2 to 3 times per week, document, and provide at future appointments, and review application for  Restasis assistance.

## 2021-08-02 ENCOUNTER — Other Ambulatory Visit: Payer: Self-pay

## 2021-08-02 ENCOUNTER — Ambulatory Visit (HOSPITAL_COMMUNITY): Payer: Medicare HMO | Attending: Cardiology

## 2021-08-02 DIAGNOSIS — R0789 Other chest pain: Secondary | ICD-10-CM | POA: Diagnosis not present

## 2021-08-02 DIAGNOSIS — R079 Chest pain, unspecified: Secondary | ICD-10-CM | POA: Diagnosis not present

## 2021-08-02 LAB — MYOCARDIAL PERFUSION IMAGING
LV dias vol: 125 mL (ref 62–150)
LV sys vol: 58 mL
Nuc Stress EF: 54 %
Peak HR: 72 {beats}/min
Rest HR: 66 {beats}/min
Rest Nuclear Isotope Dose: 7.9 mCi
SDS: 3
SRS: 0
SSS: 3
ST Depression (mm): 0 mm
Stress Nuclear Isotope Dose: 22.3 mCi
TID: 0.96

## 2021-08-02 MED ORDER — TECHNETIUM TC 99M TETROFOSMIN IV KIT
22.3000 | PACK | Freq: Once | INTRAVENOUS | Status: AC | PRN
  Administered 2021-08-02: 22.3 via INTRAVENOUS
  Filled 2021-08-02: qty 23

## 2021-08-02 MED ORDER — REGADENOSON 0.4 MG/5ML IV SOLN
0.4000 mg | Freq: Once | INTRAVENOUS | Status: AC
  Administered 2021-08-02: 0.4 mg via INTRAVENOUS

## 2021-08-02 MED ORDER — TECHNETIUM TC 99M TETROFOSMIN IV KIT
7.7000 | PACK | Freq: Once | INTRAVENOUS | Status: AC | PRN
  Administered 2021-08-02: 7.7 via INTRAVENOUS
  Filled 2021-08-02: qty 8

## 2021-08-03 ENCOUNTER — Telehealth: Payer: Self-pay

## 2021-08-03 DIAGNOSIS — R002 Palpitations: Secondary | ICD-10-CM

## 2021-08-03 NOTE — Telephone Encounter (Signed)
-----   Message from Jenean Lindau, MD sent at 08/03/2021  9:58 AM EST ----- The results of the study is unremarkable. Please inform patient. I will discuss in detail at next appointment. Cc  primary care/referring physician Jenean Lindau, MD 08/03/2021 9:58 AM

## 2021-08-03 NOTE — Telephone Encounter (Signed)
Patient notified of results, Results forward to PCP

## 2021-08-09 DIAGNOSIS — R002 Palpitations: Secondary | ICD-10-CM | POA: Diagnosis not present

## 2021-08-10 DIAGNOSIS — N3941 Urge incontinence: Secondary | ICD-10-CM | POA: Diagnosis not present

## 2021-08-10 DIAGNOSIS — E782 Mixed hyperlipidemia: Secondary | ICD-10-CM

## 2021-08-10 DIAGNOSIS — I10 Essential (primary) hypertension: Secondary | ICD-10-CM

## 2021-08-10 DIAGNOSIS — R350 Frequency of micturition: Secondary | ICD-10-CM | POA: Diagnosis not present

## 2021-08-20 ENCOUNTER — Other Ambulatory Visit: Payer: Self-pay | Admitting: Family

## 2021-08-23 ENCOUNTER — Other Ambulatory Visit: Payer: Self-pay | Admitting: Family

## 2021-08-24 ENCOUNTER — Ambulatory Visit (HOSPITAL_BASED_OUTPATIENT_CLINIC_OR_DEPARTMENT_OTHER)
Admission: RE | Admit: 2021-08-24 | Discharge: 2021-08-24 | Disposition: A | Discharge status: Home / Self Care | Payer: Medicare HMO | Source: Ambulatory Visit | Attending: Cardiology | Admitting: Cardiology

## 2021-08-24 ENCOUNTER — Other Ambulatory Visit: Payer: Self-pay

## 2021-08-24 ENCOUNTER — Telehealth: Payer: Self-pay | Admitting: Family

## 2021-08-24 DIAGNOSIS — R011 Cardiac murmur, unspecified: Secondary | ICD-10-CM | POA: Diagnosis not present

## 2021-08-24 MED ORDER — LEVOTHYROXINE SODIUM 75 MCG PO TABS: ORAL_TABLET | ORAL | Status: DC

## 2021-08-24 NOTE — Telephone Encounter (Signed)
Medication:  Rx #: [id number]  meclizine (ANTIVERT) 25 MG tablet  Has the patient contacted their pharmacy? No. (If no, request that the patient contact the pharmacy for the refill.) (If yes, when and what did the pharmacy advise?)  Preferred Pharmacy (with phone number or street name):  [facility]  [address] [place], [place] [address]  Phone:  [phone]  Fax:  [phone]   Agent: Please be advised that RX refills may take up to 3 business days. We ask that you follow-up with your pharmacy.

## 2021-08-25 ENCOUNTER — Other Ambulatory Visit (HOSPITAL_BASED_OUTPATIENT_CLINIC_OR_DEPARTMENT_OTHER): Payer: Self-pay

## 2021-08-25 MED ORDER — MECLIZINE HCL 25 MG PO TABS: 25.0000 mg | ORAL_TABLET | Freq: Three times a day (TID) | ORAL | 0 refills | Status: DC | PRN

## 2021-08-27 ENCOUNTER — Emergency Department (HOSPITAL_BASED_OUTPATIENT_CLINIC_OR_DEPARTMENT_OTHER)
Admission: EM | Admit: 2021-08-27 | Discharge: 2021-08-27 | Disposition: A | Discharge status: Home / Self Care | Payer: Medicare HMO | Attending: Emergency Medicine | Admitting: Emergency Medicine

## 2021-08-27 ENCOUNTER — Other Ambulatory Visit: Payer: Self-pay

## 2021-08-27 ENCOUNTER — Emergency Department (HOSPITAL_BASED_OUTPATIENT_CLINIC_OR_DEPARTMENT_OTHER): Payer: Medicare HMO

## 2021-08-27 ENCOUNTER — Encounter (HOSPITAL_BASED_OUTPATIENT_CLINIC_OR_DEPARTMENT_OTHER): Payer: Self-pay

## 2021-08-27 DIAGNOSIS — Z87891 Personal history of nicotine dependence: Secondary | ICD-10-CM | POA: Diagnosis not present

## 2021-08-27 DIAGNOSIS — R531 Weakness: Secondary | ICD-10-CM | POA: Diagnosis not present

## 2021-08-27 DIAGNOSIS — R42 Dizziness and giddiness: Secondary | ICD-10-CM | POA: Diagnosis not present

## 2021-08-27 DIAGNOSIS — J449 Chronic obstructive pulmonary disease, unspecified: Secondary | ICD-10-CM | POA: Insufficient documentation

## 2021-08-27 DIAGNOSIS — Z79899 Other long term (current) drug therapy: Secondary | ICD-10-CM | POA: Insufficient documentation

## 2021-08-27 DIAGNOSIS — G4489 Other headache syndrome: Secondary | ICD-10-CM | POA: Diagnosis not present

## 2021-08-27 DIAGNOSIS — E039 Hypothyroidism, unspecified: Secondary | ICD-10-CM | POA: Diagnosis not present

## 2021-08-27 DIAGNOSIS — R1111 Vomiting without nausea: Secondary | ICD-10-CM | POA: Diagnosis not present

## 2021-08-27 DIAGNOSIS — I10 Essential (primary) hypertension: Secondary | ICD-10-CM | POA: Insufficient documentation

## 2021-08-27 DIAGNOSIS — Z96653 Presence of artificial knee joint, bilateral: Secondary | ICD-10-CM | POA: Diagnosis not present

## 2021-08-27 DIAGNOSIS — Z85828 Personal history of other malignant neoplasm of skin: Secondary | ICD-10-CM | POA: Insufficient documentation

## 2021-08-27 DIAGNOSIS — Z7982 Long term (current) use of aspirin: Secondary | ICD-10-CM | POA: Diagnosis not present

## 2021-08-27 DIAGNOSIS — R112 Nausea with vomiting, unspecified: Secondary | ICD-10-CM | POA: Diagnosis not present

## 2021-08-27 LAB — ECHOCARDIOGRAM COMPLETE
AR max vel: 1.89 cm2
AV Area VTI: 1.85 cm2
AV Area mean vel: 1.86 cm2
AV Mean grad: 8 mmHg
AV Peak grad: 13.8 mmHg
Ao pk vel: 1.86 m/s
Area-P 1/2: 2.5 cm2
Calc EF: 62.6 %
S' Lateral: 3.6 cm
Single Plane A2C EF: 67.9 %
Single Plane A4C EF: 55.2 %

## 2021-08-27 LAB — CBC WITH DIFFERENTIAL/PLATELET
Abs Immature Granulocytes: 0.03 10*3/uL (ref 0.00–0.07)
Basophils Absolute: 0 10*3/uL (ref 0.0–0.1)
Basophils Relative: 1 %
Eosinophils Absolute: 0.1 10*3/uL (ref 0.0–0.5)
Eosinophils Relative: 1 %
HCT: 36.6 % — ABNORMAL LOW (ref 39.0–52.0)
Hemoglobin: 12 g/dL — ABNORMAL LOW (ref 13.0–17.0)
Immature Granulocytes: 0 %
Lymphocytes Relative: 9 %
Lymphs Abs: 0.8 10*3/uL (ref 0.7–4.0)
MCH: 32.3 pg (ref 26.0–34.0)
MCHC: 32.8 g/dL (ref 30.0–36.0)
MCV: 98.7 fL (ref 80.0–100.0)
Monocytes Absolute: 0.4 10*3/uL (ref 0.1–1.0)
Monocytes Relative: 4 %
Neutro Abs: 7.6 10*3/uL (ref 1.7–7.7)
Neutrophils Relative %: 85 %
Platelets: 296 10*3/uL (ref 150–400)
RBC: 3.71 MIL/uL — ABNORMAL LOW (ref 4.22–5.81)
RDW: 13.2 % (ref 11.5–15.5)
WBC: 8.9 10*3/uL (ref 4.0–10.5)
nRBC: 0 % (ref 0.0–0.2)

## 2021-08-27 LAB — BASIC METABOLIC PANEL
Anion gap: 9 (ref 5–15)
BUN: 22 mg/dL (ref 8–23)
CO2: 25 mmol/L (ref 22–32)
Calcium: 8.9 mg/dL (ref 8.9–10.3)
Chloride: 104 mmol/L (ref 98–111)
Creatinine, Ser: 1.05 mg/dL (ref 0.61–1.24)
GFR, Estimated: >60 mL/min (ref >60)
Glucose, Bld: 124 mg/dL — ABNORMAL HIGH (ref 70–99)
Potassium: 3.5 mmol/L (ref 3.5–5.1)
Sodium: 138 mmol/L (ref 135–145)

## 2021-08-27 LAB — TROPONIN I (HIGH SENSITIVITY)
Troponin I (High Sensitivity): 13 ng/L (ref <18)
Troponin I (High Sensitivity): 12 ng/L (ref <18)

## 2021-08-27 MED ORDER — LORAZEPAM 2 MG/ML IJ SOLN
0.5000 mg | Freq: Once | INTRAMUSCULAR | Status: AC
  Administered 2021-08-27: 0.5 mg via INTRAVENOUS
  Filled 2021-08-27: qty 1

## 2021-08-27 MED ORDER — SODIUM CHLORIDE 0.9 % IV BOLUS
1000.0000 mL | Freq: Once | INTRAVENOUS | Status: AC
  Administered 2021-08-27: 1000 mL via INTRAVENOUS

## 2021-08-27 MED ORDER — ONDANSETRON HCL 4 MG/2ML IJ SOLN
4.0000 mg | Freq: Once | INTRAMUSCULAR | Status: AC
  Administered 2021-08-27: 4 mg via INTRAVENOUS
  Filled 2021-08-27: qty 2

## 2021-08-27 MED ORDER — MECLIZINE HCL 25 MG PO TABS
25.0000 mg | ORAL_TABLET | Freq: Three times a day (TID) | ORAL | 0 refills | Status: DC | PRN
Start: 2021-08-27 — End: 2022-04-27

## 2021-08-27 NOTE — ED Notes (Signed)
Wife left number [phone]. Gave wife an update with pt permission

## 2021-08-27 NOTE — ED Provider Notes (Signed)
See the patient in signout from Dr. Melina Copa, briefly patient is a 80 year old male with a chief complaint of dizziness.  Has a history of peripheral vertigo in the past.  Feels somewhat similar.  Acutely very dizzy upon arrival.  Plan for MRI and reassessment.  MRI negative for acute stroke.  Patient was reassessed and now able to ambulate independently.  Will discharge home with prescription for meclizine.  PCP follow-up.  4:00 PM:  I have discussed the diagnosis/risks/treatment options with the patient and believe the pt to be eligible for discharge home to follow-up with PCP. We also discussed returning to the ED immediately if new or worsening sx occur. We discussed the sx which are most concerning (e.g., sudden worsening pain, fever, inability to tolerate by mouth, stroke s/sx) that necessitate immediate return. Medications administered to the patient during their visit and any new prescriptions provided to the patient are listed below.  Medications given during this visit Medications  sodium chloride 0.9 % bolus 1,000 mL (0 mLs Intravenous Stopped 08/27/21 1538)  LORazepam (ATIVAN) injection 0.5 mg (0.5 mg Intravenous Given 08/27/21 1336)  ondansetron (ZOFRAN) injection 4 mg (4 mg Intravenous Given 08/27/21 1339)     The patient appears reasonably screen and/or stabilized for discharge and I doubt any other medical condition or other [facility] requiring further screening, evaluation, or treatment in the ED at this time prior to discharge.     Deno Etienne, DO 08/27/21 1600

## 2021-08-27 NOTE — ED Notes (Signed)
Called wife's phone to let her know pt is going to be discharged so he can get a ride home

## 2021-08-27 NOTE — ED Triage Notes (Addendum)
Pt. Has been having recurring vertigo/dizzy/light headed off and on for 4 months.   This morning about 9am he had a sudden, severe onset causing repeated vomiting therefore he called EMS. Took meclizine this a.m. but vomited it back up.

## 2021-08-27 NOTE — ED Notes (Signed)
Pt returned from MRI °

## 2021-08-27 NOTE — ED Notes (Signed)
Arrived via [facility] via due to having complaints of vertigo, arrived being nauseated and active vomiting. States he feels better after vomiting episode. Pt is alert and oriented, follows commands. Able to state name and time of day. Pt stated he has intermittent vertigo for the past 4 to 5 weeks

## 2021-08-27 NOTE — ED Notes (Signed)
Pt. Transported to MRI 

## 2021-08-27 NOTE — ED Notes (Signed)
Continuous POX reading of 88% on RA is noted, finger probe assessed for proper placement, 2lpm via [place] initiated. Will cont to monitor and observe

## 2021-08-27 NOTE — ED Notes (Signed)
ED Provider at bedside. 

## 2021-08-27 NOTE — ED Provider Notes (Signed)
[place] EMERGENCY DEPARTMENT Provider Note   CSN: [id number] Arrival date & time: 08/27/21  1227     History Chief Complaint  Patient presents with   Dizziness    [name] Pratt is a 80 y.o. male.  He is presenting via ambulance from home after being acutely dizzy vertiginous.  He said he turned over in bed and acutely things were spinning and he was nauseous and vomiting.  He said this is the third time this is happened.  He has more chronic issues with numbness and weakness in his arms that have been going on for a few years.  He has been seeing a spine specialist for this.  No recent fevers chills.  No double vision or blurry vision.  The history is provided by the patient and the EMS personnel.  Dizziness Quality:  Room spinning and vertigo Severity:  Severe Duration:  1 hour Progression:  Unchanged Chronicity:  Recurrent Context: head movement   Relieved by:  Being still Worsened by:  Movement, standing up and turning head Ineffective treatments:  None tried Associated symptoms: nausea, vomiting and weakness   Associated symptoms: no chest pain, no headaches, no hearing loss, no shortness of breath and no vision changes       Past Medical History:  Diagnosis Date   Allergic rhinitis 02/02/2016   Anemia 12/17/2009   Formatting of this note might be different from the original. 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 difficile colitis 04/11/2021   Cancer ([place])    skin cancer   Carotid stenosis    a. Carotid U/S 1/69: LICA < 67%, RICA 89-38%;  b.  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   Cystoid macular edema of both eyes 07/28/2020   Degenerative disc disease, lumbar 06/30/2015   Deviated nasal septum 02/18/2015   Diverticulosis 03/03/2020   DJD (degenerative joint disease) of hip 12/24/2012   Dyspnea 06/12/2011   Emphysema, unspecified ([place]) 03/05/2020   Erectile dysfunction 06/20/2017   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[place]) 02/09/2021   Gait disorder 03/02/2021   GERD (gastroesophageal reflux disease)    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   Qualifier: Diagnosis of  By: Burnett Kanaris     Greater trochanteric pain syndrome 07/22/2021   Hearing loss 02/02/2016   Heme positive stool 02/29/2020   HIATAL HERNIA 05/13/2009   Qualifier: Diagnosis of  By: Burnett Kanaris     High risk medication use 01/18/2012   Hip pain 03/02/2021   History of chicken pox    History of hemorrhoids    History of kidney stones    History of total bilateral knee replacement 07/08/2020   Hyperglycemia 03/03/2014   Hyperlipidemia    under control   Hyperlipidemia with target  LDL less than 130 12/27/2010   Formatting of this note might be different from the original. Cardiology - Dr Frances Nickels at [facility] cardiology  ICD-10 cut over   Hypertension    under control   Hypokalemia 07/08/2021   Hypothyroidism    Ileus ([place])    Internal hemorrhoids 03/03/2020   Leg cramps 11/13/2019   Lower GI bleed 12/12/2012   Lumbago of lumbar region with sciatica 10/21/2020   Lumbar radiculopathy 03/02/2021   Lumbar spondylosis 11/02/2020   Nasal congestion 04/11/2021   NEPHROLITHIASIS 05/13/2009   Qualifier: Diagnosis of  By: Burnett Kanaris      Nonspecific abnormal electrocardiogram (ECG) (EKG) 01/06/2013   Onychomycosis 06/30/2015   Osteoarthrosis, 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 Osteoarthritis Of The Hand  10/1 IMO update   Osteoarthrosis, unspecified whether generalized or localized, pelvic region and thigh 07/25/2010   Formatting of this note might be different from the original. Osteoarthritis Of The Hip   Other abnormal glucose 07/22/2021   Other allergic rhinitis 02/18/2015   Other ill-defined and unknown causes of morbidity and mortality 09/12/1999   Nov 20, 2000 Entered By: Alycia Patten Comment: ltFeb 23, 2006 Entered By: Alycia Patten Comment: rt 11/05   Peripheral neuropathy 06/19/2016   PONV (postoperative nausea and vomiting)    Preventative health care 11/24/2015   Right groin pain 11/29/2012   Right inguinal hernia 05/13/2009   Qualifier: Diagnosis of  By: Burnett Kanaris     RLS (restless legs syndrome) 02/18/2015   Rosacea    Rotator cuff tear 07/27/2013   Situational depression 03/02/2021   Sleep apnea with use of continuous positive airway pressure (CPAP) 07/15/2013   CPAP set to  7 cm water,  Residual AHi was 7.8  With no leak.  User time 6 hours.  479 days , not used only 12 days - highly compliant 06-23-13  .    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   Urinary retention 11/26/2020   Ventral hernia 07/08/2012   Vertigo 07/08/2021   Weight loss 03/03/2020    Patient Active Problem List   Diagnosis Date Noted   Palpitations 07/25/2021   Cardiac murmur 07/25/2021   History of chicken pox 07/22/2021   History of hemorrhoids 07/22/2021   Hypertension 07/22/2021   COPD  (chronic obstructive pulmonary disease) ([place]) 07/22/2021   History of kidney stones 07/22/2021   Cancer ([place]) 74/16/3845   Complication of anesthesia 07/22/2021   Ileus ([place]) 07/22/2021   PONV (postoperative nausea and vomiting) 07/22/2021   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[place]) 02/09/2021   Urinary retention 11/26/2020   Cervical myelopathy ([place]) 11/24/2020   Body mass index (BMI) 25.0-25.9, adult 11/02/2020   Lumbar spondylosis 11/02/2020   Lumbago of lumbar  region with sciatica 10/21/2020   Cystoid macular edema of both eyes 07/28/2020   Status post cervical spinal fusion 07/09/2020   History of total bilateral knee replacement 07/08/2020   Aortic atherosclerosis ([place]) 03/05/2020   Emphysema, unspecified ([place]) 03/05/2020   Arthritis 03/04/2020   Diverticulosis 03/03/2020   Internal hemorrhoids 03/03/2020   Weight loss 03/03/2020   Heme positive stool 02/29/2020   Leg cramps 11/13/2019   Tibialis anterior tendon tear, nontraumatic 11/13/2019   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   CPAP (continuous positive airway pressure) dependence 03/25/2018   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   Erectile dysfunction 06/20/2017   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 03/06/2017   Peripheral neuropathy 06/19/2016   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   Hearing loss 02/02/2016   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   Allergic rhinitis  02/02/2016   Atypical chest pain 11/24/2015   Preventative health care 11/24/2015   Onychomycosis 06/30/2015   Degenerative disc disease, lumbar 06/30/2015   Other allergic rhinitis 02/18/2015   Deviated nasal septum 02/18/2015   RLS (restless legs syndrome) 02/18/2015   GERD (gastroesophageal reflux disease) 09/18/2014   Hyperglycemia 03/03/2014   Rotator cuff tear 07/27/2013   Sleep apnea with use of continuous positive airway pressure (CPAP) 07/15/2013   Carotid stenosis 02/24/2013   Nonspecific abnormal electrocardiogram (ECG) (EKG) 01/06/2013   DJD (degenerative joint disease) of hip 12/24/2012   Lower GI bleed 12/12/2012   Right groin pain 11/29/2012   Ventral hernia 07/08/2012   High risk medication use 01/18/2012   Dyspnea 06/12/2011   Hyperlipidemia with target LDL less than 130 12/27/2010   Osteoarthrosis, unspecified whether generalized or localized, pelvic region and thigh 07/25/2010   Osteoarthrosis, 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/2010   Anemia 12/17/2009   Hypothyroidism 05/13/2009   Hyperlipidemia 05/13/2009   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   Benign essential hypertension 05/13/2009   Right inguinal hernia 05/13/2009   HIATAL HERNIA 05/13/2009   NEPHROLITHIASIS 05/13/2009   ROSACEA 05/13/2009   Other ill-defined and unknown causes of morbidity and mortality 09/12/1999    Past Surgical History:  Procedure Laterality Date   ABDOMINAL HERNIA REPAIR  07/15/12   Dr [name]   ANTERIOR CERVICAL DECOMP/DISCECTOMY FUSION N/A 07/09/2020   Procedure: ANTERIOR CERVICAL DECOMPRESSION AND FUSION CERVICAL THREE-FOUR.;  Surgeon: [name], MD;  Location: [facility];  Service: Neurosurgery;  Laterality: N/A;  anterior   BROW LIFT  05/07/01   CYSTOSCOPY WITH URETEROSCOPY AND STENT PLACEMENT Right 01/28/2014   Procedure: CYSTOSCOPY WITH URETEROSCOPY, BASKET RETRIVAL AND  STENT PLACEMENT;  Surgeon: [name], MD;  Location: [facility];  Service: Urology;  Laterality: Right;   epidural injections     multiple procedures    ESOPHAGEAL DILATION  1993 and 1994   multiple times   EYE SURGERY Bilateral 03/25/10, 2012   cataract removal   HEMORRHOID SURGERY  2014   HIATAL HERNIA REPAIR  12/21/93   HOLMIUM LASER APPLICATION Right 5/91/6384   Procedure: HOLMIUM LASER APPLICATION;  Surgeon: [name], MD;  Location: [facility];  Service: Urology;  Laterality: Right;   INGUINAL HERNIA REPAIR Right 07/15/12   Dr [name], x2   JOINT REPLACEMENT  07/25/04   right knee   JOINT REPLACEMENT  07/13/10   left knee   [place]  09/20/06   with intraoperative cholangiogram and right inguinal herniorrhaphy with mesh    LIGAMENT REPAIR Left 07/2013   shoulder   LITHOTRIPSY  x2   NASAL SEPTUM SURGERY  1975   POSTERIOR CERVICAL FUSION/FORAMINOTOMY N/A 11/24/2020   Procedure: Posterior Cervical Laminectomy Cervical three-four, Cervical four-five with lateral mass fusion;  Surgeon: [name], MD;  Location: [facility];  Service: Neurosurgery;  Laterality: N/A;   REFRACTIVE SURGERY Bilateral    Right total hip replacement  4/14   SKIN CANCER DESTRUCTION     nose, ear   TONSILLECTOMY  as child   TOTAL HIP ARTHROPLASTY Left    URETHRAL DILATION  1991   [name]       Family History  Problem Relation Age of Onset   Cancer Mother    Hypertension Other    Cancer Other    Osteoarthritis Other     Social History   Tobacco Use   Smoking status: Former    Years: 11.00    Types: Cigarettes    Quit date: 09/11/1978    Years since quitting: 42.9   Smokeless tobacco: Never  Vaping Use   Vaping Use: Never used  Substance Use Topics   Alcohol use: Not Currently    Alcohol/week: 0.0 standard drinks    Comment: rare   Drug use: No    Home Medications Prior to Admission medications   Medication Sig Start Date End Date Taking? Authorizing Provider  acetaminophen (TYLENOL) 650 MG CR tablet Take 1,300 mg by mouth every morning. And take 650 mg at bedtime    [provider]   albuterol (VENTOLIN HFA) 108 (90 Base) MCG/ACT inhaler Inhale 2 puffs into the lungs every 6 (six) hours as needed for shortness of breath. 06/15/20   [name], NP  Alcohol Swabs (ALCOHOL PADS) 70 % PADS 1 packet by Does not apply route 2 (two) times daily. 11/22/20   [name], NP  allopurinol (ZYLOPRIM) 100 MG tablet Take 100 mg by mouth 2 (two) times daily.    [provider]  amLODipine (NORVASC) 10 MG tablet Take 1 tablet (10 mg total) by mouth daily. 07/08/21   [name], NP  aspirin EC 81 MG tablet Take 81 mg by mouth every morning.    [provider]  Cholecalciferol (VITAMIN D) 50 MCG (2000 UT) CAPS Take 2,000 Units by mouth daily.    [provider]  colchicine 0.6 MG tablet Take 0.6 mg by mouth as needed for other. For gout    [provider]  cycloSPORINE (RESTASIS) 0.05 % ophthalmic emulsion Place 1 drop into both eyes 2 (two) times daily.    [provider]  diclofenac Sodium (VOLTAREN) 1 % GEL Apply 1 application topically daily as needed for pain (Hand pain).      fluocinonide (LIDEX) 0.05 % external solution Apply 1 application topically 2 (two) times daily as needed for itching or pain. 06/13/21   [provider]  gabapentin (NEURONTIN) 300 MG capsule Take 300 mg by mouth 2 (two) times daily as needed. Leg and nerve pain    [provider]  glucose blood test strip TRUE Metrix Blood Glucose Test Strip, use as instructed 11/12/20   [name], NP  Homeopathic Products ([name]) FOAM Apply 1 application topically 2 (two) times daily as needed (Pain).    [provider]  hydrALAZINE (APRESOLINE) 50 MG tablet Take 50 mg by mouth 2 (two) times daily.    [provider]  Lancets MISC 1 each by Does not apply route 2 (two) times daily. TRUE matrix lancets 11/12/20   [name], NP  levothyroxine (SYNTHROID)  75 MCG tablet TAKE 2 TABS BY MOUTH ONCE DAILY 6 DAYS  A WEEK AND 1 TAB BY MOUTH ONCE DAILY ONE DAY A WEEK 08/24/21   [name], NP  losartan (COZAAR) 100 MG tablet TAKE 1 TABLET EVERY DAY 08/20/21   [name], NP  meclizine (ANTIVERT) 25 MG tablet Take 1 tablet (25 mg total) by mouth 3 (three) times daily as needed for dizziness. 08/25/21   [name], NP  mirabegron ER (MYRBETRIQ) 25 MG TB24 tablet Take 25 mg by mouth daily.    [provider]  multivitamin [facility]) per tablet Take 1 tablet by mouth daily.    [provider]  nitroGLYCERIN (NITROSTAT) 0.4 MG SL tablet Place 1 tablet (0.4 mg total) under the tongue every 5 (five) minutes as needed for chest pain. 03/11/18   [name], MD  polyethylene glycol (MIRALAX / GLYCOLAX) 17 g packet Take 17-34 g by mouth daily as needed.    [provider]  rosuvastatin (CRESTOR) 5 MG tablet Take 5 mg by mouth daily.    [provider]  sodium chloride (OCEAN) 0.65 % nasal spray Place 1 spray into both nostrils at bedtime as needed for congestion.    [provider]    Allergies    Pravastatin, Sildenafil, Oxycodone, Atenolol, Elastic bandages & [zinc], and Metoclopramide hcl  Review of Systems   Review of Systems  Constitutional:  Negative for fever.  HENT:  Negative for hearing loss.   Eyes:  Negative for visual disturbance.  Respiratory:  Negative for shortness of breath.   Cardiovascular:  Negative for chest pain.  Gastrointestinal:  Positive for nausea and vomiting.  Genitourinary:  Negative for dysuria.  Musculoskeletal:  Positive for neck pain (chronic).  Skin:  Negative for rash.  Neurological:  Positive for dizziness, weakness and numbness. Negative for headaches.   Physical Exam Updated Vital Signs BP (!) 159/69    Pulse (!) 53    Temp 98 F (36.7 C) (Oral)    Resp 14    Ht 5\' 6"  (1.676 m)    Wt 70.3 kg    SpO2 91%    BMI 25.02 kg/m   Physical Exam Vitals and nursing note reviewed.  Constitutional:       General: He is not in acute distress.    Appearance: Normal appearance. He is well-developed.  HENT:     Head: Normocephalic and atraumatic.  Eyes:     Extraocular Movements: Extraocular movements intact.     Conjunctiva/sclera: Conjunctivae normal.     Pupils: Pupils are equal, round, and reactive to light.  Cardiovascular:     Rate and Rhythm: Normal rate and regular rhythm.     Heart sounds: No murmur heard. Pulmonary:     Effort: Pulmonary effort is normal. No respiratory distress.     Breath sounds: Normal breath sounds.  Abdominal:     Palpations: Abdomen is soft.     Tenderness: There is no abdominal tenderness.  Musculoskeletal:        General: No swelling. Normal range of motion.     Cervical back: Neck supple.  Skin:    General: Skin is warm and dry.     Capillary Refill: Capillary refill takes less than 2 seconds.  Neurological:     Mental Status: He is alert. Mental status is at baseline.     Motor: No weakness.     Comments: Difficult to perform neurologic exam as patient keeps eyes closed and does not  want to move.  Distal strength appears intact and nonfocal.  Sensory seems to be at his baseline with subjective decrease sensation in hands and feet.  Psychiatric:        Mood and Affect: Mood normal.    ED Results / Procedures / Treatments   Labs (all labs ordered are listed, but only abnormal results are displayed) Labs Reviewed  BASIC METABOLIC PANEL - Abnormal; Notable for the following components:      Result Value   Glucose, Bld 124 (*)    All other components within normal limits  CBC WITH DIFFERENTIAL/PLATELET - Abnormal; Notable for the following components:   RBC 3.71 (*)    Hemoglobin 12.0 (*)    HCT 36.6 (*)    All other components within normal limits  TROPONIN I (HIGH SENSITIVITY)  TROPONIN I (HIGH SENSITIVITY)    EKG EKG Interpretation  Date/Time:  Saturday August 27 2021 12:46:03 EST Ventricular Rate:  58 PR Interval:  243 QRS  Duration: 168 QT Interval:  470 QTC Calculation: 462 R Axis:   -50 Text Interpretation: Sinus rhythm Multiform ventricular premature complexes 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 COMPARISON:  CT head 06/14/2021, brain MRI 03/15/2016 FINDINGS: Brain: There is no evidence of acute intracranial hemorrhage, extra-axial fluid collection, or acute infarct. There is susceptibility in the right caudate body extending to the lentiform nucleus likely reflecting sequela of prior hemorrhage. There is associated encephalomalacia with mild ex vacuo dilatation of the body of the right lateral ventricle. The ventricles are otherwise normal in size. There is mild global parenchymal volume loss. Patchy foci of FLAIR signal abnormality throughout the remainder of the subcortical and periventricular white matter likely reflects sequela of chronic white matter microangiopathy. There is no solid mass lesion.  There is no midline shift. Vascular: Normal flow voids. Skull and upper cervical spine: Normal marrow signal. Sinuses/Orbits: There is mild mucosal thickening in the paranasal sinuses. Bilateral lens implants are in place. The globes and orbits are otherwise unremarkable. Other: There is trace fluid in the left mastoid air cells. IMPRESSION: 1. No acute intracranial pathology. 2. Remote hemorrhage in the right caudate/basal ganglia. 3. Mild global parenchymal volume loss and moderate chronic white matter microangiopathy. 4. Trace left mastoid effusion. Electronically Signed   By: Valetta Mole M.D.   On: 08/27/2021 15:36     Procedures Procedures   Medications Ordered in ED Medications  sodium chloride 0.9 % bolus 1,000 mL (0 mLs Intravenous Stopped 08/27/21 1538)  LORazepam (ATIVAN) injection 0.5 mg (0.5 mg Intravenous Given 08/27/21 1336)  ondansetron (ZOFRAN) injection 4 mg (4 mg Intravenous Given 08/27/21 1339)    ED Course  I have reviewed the triage vital signs and the nursing notes.  Pertinent labs & imaging results that were available during my care of the patient were reviewed by me and considered in my medical decision making (see chart for details).  Clinical Course as of 08/27/21 1705  Sat Aug 27, 2021  1416 Patient was able to sit up on the side of the bed after Ativan with some improvement in his dizziness.  He is on his way over to MRI. [MB]    Clinical Course User Index [MB]  Hayden Rasmussen, MD   MDM Rules/Calculators/A&P                         This patient complains of dizziness vertigo vomiting; this involves an extensive number of treatment Options and is a complaint that carries with it a high risk of complications and Morbidity. The differential includes peripheral vertigo, central vertigo, stroke, bleed, dehydration, metabolic derangement, arrhythmia  I ordered, reviewed and interpreted labs, which included CBC with normal white count, hemoglobin slightly lower than priors, chemistries normal other than mildly elevated glucose, troponins flat I ordered medication IV Ativan and fluids, IV Zofran for nausea I ordered imaging studies which included MRI brain and this is pending at time of signout Additional history obtained from EMS Previous records obtained and reviewed in epic including prior ED visits for similar presentation  After the interventions stated above, I reevaluated the patient and found patient be symptomatically improved.  His care is signed out to oncoming provider Dr. Tyrone Nine to follow-up results of MRI.  If patient's symptoms are controlled and no signs of  acute stroke likely can be discharged to follow-up outpatient with neurology.     Final Clinical Impression(s) / ED Diagnoses Final diagnoses:  Vertigo  Nausea and vomiting, unspecified vomiting type    Rx / DC Orders ED Discharge Orders          Ordered    meclizine (ANTIVERT) 25 MG tablet  3 times daily PRN        08/27/21 1559             Hayden Rasmussen, MD 08/27/21 1708

## 2021-08-29 ENCOUNTER — Telehealth: Payer: Self-pay | Admitting: Cardiology

## 2021-08-29 ENCOUNTER — Telehealth: Payer: Self-pay

## 2021-08-29 ENCOUNTER — Telehealth: Payer: Self-pay | Admitting: Family

## 2021-08-29 ENCOUNTER — Other Ambulatory Visit: Payer: Self-pay

## 2021-08-29 MED ORDER — HYDRALAZINE HCL 50 MG PO TABS: 50.0000 mg | ORAL_TABLET | Freq: Two times a day (BID) | ORAL | 2 refills | Status: DC

## 2021-08-29 NOTE — Telephone Encounter (Signed)
Patient is requesting a call back to go over echo results.

## 2021-08-29 NOTE — Telephone Encounter (Signed)
Patient states it's going to a week to get his  hydrALAZINE (APRESOLINE) 50 MG tablet prescription through mail so he would like a partial refill sent over to his pharmacy of 5 pills.     [address]  [place], [place] [address]  Phone:  [phone]  Fax:  [phone]

## 2021-08-29 NOTE — Telephone Encounter (Signed)
Pt evaluated/treated in ER/UC.  [facility] - [place] RECORDAccessNurse Patient Name:[name] Pratt Initial Comment Caller states he has vertigo badly. States he was very dizzy and he was nauseous and is vomiting. Last urinated about 9 AM. He took a motion sickness pill but he thinks he threw it up. Doctor unknown but location verified. Translation No Nurse Assessment Nurse: Velta Addison, RN, [place] Date/Time (Eastern Time): 08/27/2021 10:49:32 AM Confirm and document reason for call. If symptomatic, describe symptoms. ---Caller states he has vertigo badly. States he was very dizzy and he was nauseous and is vomiting. Last urinated about 9 AM. He took a motion sickness pill but he thinks he threw it up. Still currently vomiting while triaging. Started when he woke up this morning. States he rolled over and started dizziness. Does the patient have any new or worsening symptoms? ---Yes Will a triage be completed? ---Yes Related visit to physician within the last 2 weeks? ---Yes Does the PT have any chronic conditions? (i.e. diabetes, asthma, this includes High risk factors for pregnancy, etc.) ---Yes List chronic conditions. ---HTN, Heart ? Is this a behavioral health or substance abuse call? ---No Guidelines Guideline Title Affirmed Question Affirmed Notes Nurse Date/Time (Eastern Time) Dizziness - Vertigo SEVERE dizziness (vertigo) (e.g., unable to walk without assistance) Parrott, RN, [place] 08/27/2021 10:52:28 AM PLEASE NOTE: All timestamps contained within this report are represented as [place] Standard Time. CONFIDENTIALTY NOTICE: This fax transmission is intended only for the addressee. It contains information that is legally privileged, confidential or otherwise protected from use or disclosure. If you are not the intended recipient, you are strictly prohibited from reviewing, disclosing, copying using or disseminating any of  this information or taking any action in reliance on or regarding this information. If you have received this fax in error, please notify us immediately by telephone so that we can arrange for its return to [place]. Phone: [phone], Toll-Free: [phone], Fax: [phone] Page: 2 of 2 Call Id: [id number] [place]. Time Eilene Ghazi Time) Disposition Final User 08/27/2021 10:55:21 AM Go to ED Now (or PCP triage) Yes Velta Addison, RN, [place] Disagree/Comply Comply Caller Understands Yes PreDisposition Did not know what to do Care Advice Given Per Guideline GO TO ED NOW (OR PCP TRIAGE): * IF NO PCP (PRIMARY CARE PROVIDER) SECOND-LEVEL TRIAGE: You need to be seen within the next hour. Go to the [facility] at _____________ [place] as soon as you can. NOTE TO TRIAGER - DRIVING: * Another adult should drive. CARE ADVICE given per Dizziness - Vertigo (Adult) guideline. NOTE TO TRIAGER - AMBULANCE TRANSPORT: * If the patient cannot walk at all because of severe dizziness, then the patient may need to be transported via ambulance. Comments User: Hamilton Capri, RN Date/Time Eilene Ghazi Time): 08/27/2021 10:57:46 AM Caller states his wife is not home, unsure where she is at this time. Uses cane to walk and currently he is in bathroom, states he had to take his clothes off due to they were too tight. Advised him to call 911 for assistance and transport to hospital due to it is not safe for him to ambulate at this time and he agreed. Calling 911 for transport Referrals GO TO FACILITY UNDECIDE

## 2021-08-29 NOTE — Telephone Encounter (Signed)
Rx sent 

## 2021-08-30 ENCOUNTER — Telehealth: Payer: Self-pay

## 2021-08-30 NOTE — Telephone Encounter (Signed)
Patient notified of results.

## 2021-08-30 NOTE — Telephone Encounter (Signed)
-----   Message from Richardo Priest, MD sent at 08/30/2021 12:18 PM EST ----- Good result echo done for murmur no significant malady.

## 2021-09-01 ENCOUNTER — Encounter (INDEPENDENT_AMBULATORY_CARE_PROVIDER_SITE_OTHER): Payer: Self-pay | Admitting: Ophthalmology

## 2021-09-01 ENCOUNTER — Encounter (INDEPENDENT_AMBULATORY_CARE_PROVIDER_SITE_OTHER): Payer: Medicare HMO | Admitting: Ophthalmology

## 2021-09-01 ENCOUNTER — Other Ambulatory Visit: Payer: Self-pay

## 2021-09-01 ENCOUNTER — Ambulatory Visit (INDEPENDENT_AMBULATORY_CARE_PROVIDER_SITE_OTHER): Payer: Medicare HMO | Admitting: Ophthalmology

## 2021-09-01 DIAGNOSIS — H353211 Exudative age-related macular degeneration, right eye, with active choroidal neovascularization: Secondary | ICD-10-CM | POA: Diagnosis not present

## 2021-09-01 DIAGNOSIS — H35073 Retinal telangiectasis, bilateral: Secondary | ICD-10-CM

## 2021-09-01 DIAGNOSIS — H35353 Cystoid macular degeneration, bilateral: Secondary | ICD-10-CM | POA: Diagnosis not present

## 2021-09-01 MED ORDER — BEVACIZUMAB 2.5 MG/0.1ML IZ SOSY
2.5000 mg | PREFILLED_SYRINGE | INTRAVITREAL | Status: AC | PRN
  Administered 2021-09-01: 10:00:00 2.5 mg via INTRAVITREAL

## 2021-09-01 NOTE — Assessment & Plan Note (Signed)
Vastly improved 6 months post Avastin peripapillary CNVM less active, repeat today

## 2021-09-01 NOTE — Assessment & Plan Note (Signed)
Stable overall long-duration use of successful CPAP

## 2021-09-01 NOTE — Progress Notes (Signed)
09/01/2021     CHIEF COMPLAINT Patient presents for  Chief Complaint  Patient presents with   Retina Follow Up      HISTORY OF PRESENT ILLNESS: [name] Pratt is a 80 y.o. male who presents to the clinic today for:   HPI     Retina Follow Up           Diagnosis: Wet AMD   Laterality: right eye   Onset: 6 weeks ago   Severity: mild   Duration: 6 weeks   Course: stable         Comments   6 week fu OD oct Avastin OD. Pt states he has experienced vertigo 3 times since last visit and has fallen twice. He states he did not hit his head when he fell.      Last edited by Laurin Coder on 09/01/2021  9:38 AM.      Referring physician: [name], NP [address] [place],  [place] [address]  HISTORICAL INFORMATION:   Selected notes from the MEDICAL RECORD NUMBER    Lab Results  Component Value Date   HGBA1C 5.7 12/29/2020     CURRENT MEDICATIONS: Current Outpatient Medications (Ophthalmic Drugs)  Medication Sig   cycloSPORINE (RESTASIS) 0.05 % ophthalmic emulsion Place 1 drop into both eyes 2 (two) times daily.   No current facility-administered medications for this visit. (Ophthalmic Drugs)   Current Outpatient Medications (Other)  Medication Sig   acetaminophen (TYLENOL) 650 MG CR tablet Take 1,300 mg by mouth every morning. And take 650 mg at bedtime   albuterol (VENTOLIN HFA) 108 (90 Base) MCG/ACT inhaler Inhale 2 puffs into the lungs every 6 (six) hours as needed for shortness of breath.   Alcohol Swabs (ALCOHOL PADS) 70 % PADS 1 packet by Does not apply route 2 (two) times daily.   allopurinol (ZYLOPRIM) 100 MG tablet Take 100 mg by mouth 2 (two) times daily.   amLODipine (NORVASC) 10 MG tablet Take 1 tablet (10 mg total) by mouth daily.   aspirin EC 81 MG tablet Take 81 mg by mouth every morning.   Cholecalciferol (VITAMIN D) 50 MCG (2000 UT) CAPS Take 2,000 Units by mouth daily.   colchicine 0.6 MG tablet Take 0.6 mg  by mouth as needed for other. For gout   diclofenac Sodium (VOLTAREN) 1 % GEL Apply 1 application topically daily as needed for pain (Hand pain).   fluocinonide (LIDEX) 0.05 % external solution Apply 1 application topically 2 (two) times daily as needed for itching or pain.   gabapentin (NEURONTIN) 300 MG capsule Take 300 mg by mouth 2 (two) times daily as needed. Leg and nerve pain   glucose blood test strip TRUE Metrix Blood Glucose Test Strip, use as instructed   Homeopathic Products (THERAWORX RELIEF) FOAM Apply 1 application topically 2 (two) times daily as needed (Pain).   hydrALAZINE (APRESOLINE) 50 MG tablet Take 1 tablet (50 mg total) by mouth 2 (two) times daily.   Lancets MISC 1 each by Does not apply route 2 (two) times daily. TRUE matrix lancets   levothyroxine (SYNTHROID) 75 MCG tablet TAKE 2 TABS BY MOUTH ONCE DAILY 6 DAYS A WEEK AND 1 TAB BY MOUTH ONCE DAILY ONE DAY A WEEK   losartan (COZAAR) 100 MG tablet TAKE 1 TABLET EVERY DAY   meclizine (ANTIVERT) 25 MG tablet Take 1 tablet (25 mg total) by mouth 3 (three) times daily as needed for dizziness.  mirabegron ER (MYRBETRIQ) 25 MG TB24 tablet Take 25 mg by mouth daily.   multivitamin (THERAGRAN) per tablet Take 1 tablet by mouth daily.   nitroGLYCERIN (NITROSTAT) 0.4 MG SL tablet Place 1 tablet (0.4 mg total) under the tongue every 5 (five) minutes as needed for chest pain.   polyethylene glycol (MIRALAX / GLYCOLAX) 17 g packet Take 17-34 g by mouth daily as needed.   rosuvastatin (CRESTOR) 5 MG tablet Take 5 mg by mouth daily.   sodium chloride (OCEAN) 0.65 % nasal spray Place 1 spray into both nostrils at bedtime as needed for congestion.   No current facility-administered medications for this visit. (Other)      REVIEW OF SYSTEMS:    ALLERGIES Allergies  Allergen Reactions   Pravastatin Other (See Comments)    Muscle cramps   Sildenafil Other (See Comments)    Tachycardia     Oxycodone     hallucinations    Atenolol Other (See Comments)    bradycardia   Elastic Bandages & [Zinc] Other (See Comments)    Teflon bandages - Irritation   Metoclopramide Hcl Anxiety    PAST MEDICAL HISTORY Past Medical History:  Diagnosis Date   Allergic rhinitis 02/02/2016   Anemia 12/17/2009   Formatting of this note might be different from the original. Anemia  10/1 IMO update   Aortic atherosclerosis ([place]) 03/05/2020   Arthritis    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   Atypical chest pain 11/24/2015   Benign essential hypertension 05/13/2009   Qualifier: Diagnosis of  By: Larwance Sachs of this note might be different from the original. Hypertension   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[place]) 07/22/2021   Body mass index (BMI) 25.0-25.9, adult 11/02/2020   C. difficile colitis 04/11/2021   Cancer ([place])    skin cancer   Carotid stenosis    a. Carotid U/S 0/09: LICA < 23%, RICA 30-07%;  b.  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   Cystoid macular edema of both eyes 07/28/2020   Degenerative disc disease, lumbar 06/30/2015   Deviated nasal septum 02/18/2015   Diverticulosis 03/03/2020   DJD (degenerative joint disease) of hip 12/24/2012   Dyspnea 06/12/2011   Emphysema, unspecified ([place]) 03/05/2020   Erectile dysfunction 06/20/2017   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[place]) 02/09/2021   Gait disorder 03/02/2021   GERD (gastroesophageal reflux disease)    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   Qualifier: Diagnosis of  By: Burnett Kanaris     Greater trochanteric pain syndrome 07/22/2021   Hearing loss 02/02/2016   Heme positive stool 02/29/2020    HIATAL HERNIA 05/13/2009   Qualifier: Diagnosis of  By: Burnett Kanaris     High risk medication use 01/18/2012   Hip pain 03/02/2021   History of chicken pox    History of hemorrhoids    History of kidney stones    History of total bilateral knee replacement 07/08/2020   Hyperglycemia 03/03/2014   Hyperlipidemia    under control   Hyperlipidemia with target LDL less than 130 12/27/2010   Formatting of this note might be different from the original. Cardiology - Dr Frances Nickels  at 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 Osteoarthritis Of The Hand  10/1 IMO update   Osteoarthrosis, unspecified whether generalized or localized, pelvic region and thigh 07/25/2010   Formatting of this note might be different from the original. Osteoarthritis Of The Hip   Other abnormal glucose 07/22/2021   Other allergic rhinitis 02/18/2015   Other ill-defined and unknown causes of morbidity and mortality 09/12/1999   Nov 20, 2000 Entered By: Alycia Patten Comment: ltFeb 23, 2006 Entered By: Alycia Patten Comment: rt 11/05   Peripheral neuropathy 06/19/2016   PONV (postoperative nausea and vomiting)    Preventative health care 11/24/2015   Right groin pain 11/29/2012   Right inguinal hernia 05/13/2009   Qualifier: Diagnosis of  By: Burnett Kanaris     RLS (restless legs syndrome) 02/18/2015   Rosacea    Rotator cuff tear 07/27/2013   Situational  depression 03/02/2021   Sleep apnea with use of continuous positive airway pressure (CPAP) 07/15/2013   CPAP set to  7 cm water,  Residual AHi was 7.8  With no leak.  User time 6 hours.  479 days , not used only 12 days - highly compliant 06-23-13  .    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   Urinary retention 11/26/2020   Ventral hernia 07/08/2012   Vertigo 07/08/2021   Weight loss 03/03/2020   Past Surgical History:  Procedure Laterality Date   ABDOMINAL HERNIA REPAIR  07/15/12   Dr [name]   ANTERIOR CERVICAL DECOMP/DISCECTOMY FUSION N/A 07/09/2020   Procedure: ANTERIOR CERVICAL DECOMPRESSION AND FUSION CERVICAL THREE-FOUR.;  Surgeon: [name], MD;  Location: [facility];  Service: Neurosurgery;  Laterality: N/A;  anterior   BROW LIFT  05/07/01   CYSTOSCOPY WITH URETEROSCOPY AND STENT PLACEMENT Right 01/28/2014   Procedure: CYSTOSCOPY WITH URETEROSCOPY, BASKET RETRIVAL AND  STENT PLACEMENT;  Surgeon: [name], MD;  Location: [facility];  Service: Urology;  Laterality: Right;   epidural injections     multiple procedures   ESOPHAGEAL DILATION  1993 and 1994   multiple times   EYE SURGERY Bilateral 03/25/10, 2012   cataract removal   HEMORRHOID SURGERY  2014   HIATAL HERNIA REPAIR  12/21/93   HOLMIUM LASER APPLICATION Right 3/76/2831   Procedure: HOLMIUM LASER APPLICATION;  Surgeon: [name], MD;  Location: [facility];  Service: Urology;  Laterality: Right;   INGUINAL HERNIA REPAIR Right 07/15/12   Dr [name], x2   JOINT REPLACEMENT  07/25/04   right knee   JOINT REPLACEMENT  07/13/10   left knee   [place]  09/20/06   with intraoperative cholangiogram  and right inguinal herniorrhaphy with mesh    LIGAMENT REPAIR Left 07/2013   shoulder   LITHOTRIPSY     x2   [place] FUSION/FORAMINOTOMY N/A 11/24/2020   Procedure: Posterior Cervical Laminectomy Cervical three-four, Cervical four-five with lateral mass fusion;  Surgeon: [name], MD;  Location: [facility];  Service: Neurosurgery;  Laterality: N/A;   REFRACTIVE SURGERY Bilateral    Right total hip replacement  4/14   SKIN CANCER DESTRUCTION     nose, ear   TONSILLECTOMY  as child   TOTAL HIP ARTHROPLASTY Left    URETHRAL DILATION  1991   [name]    FAMILY HISTORY Family History  Problem Relation Age of Onset   Cancer Mother    Hypertension Other    Cancer Other    Osteoarthritis Other     SOCIAL HISTORY Social History   Tobacco Use   Smoking status: Former    Years: 11.00    Types: Cigarettes    Quit date: 09/11/1978    Years since quitting: 43.0   Smokeless tobacco: Never  Vaping Use   Vaping Use: Never used  Substance Use Topics   Alcohol use: Not Currently    Alcohol/week: 0.0 standard drinks    Comment: rare   Drug use: No         OPHTHALMIC EXAM:  Base Eye Exam     Visual Acuity (ETDRS)       Right Left   Dist [place] 20/60 -2 20/40 -2   Dist ph [place] NI NI         Tonometry (Tonopen, 9:35 AM)       Right Left   Pressure 9 10         Pupils       Pupils Dark Light React APD   Right PERRL 1.5 1 Minimal None   Left PERRL 1.5 1 Minimal None         Extraocular Movement       Right Left    Full Full         Neuro/Psych     Oriented x3: Yes   Mood/Affect: Normal         Dilation     Right eye: 1.0% Mydriacyl, 2.5% Phenylephrine @ 9:35 AM           Slit Lamp and Fundus Exam     External Exam       Right Left   External Normal Normal         Slit Lamp Exam       Right Left   Lids/Lashes Normal Normal   Conjunctiva/Sclera White and quiet White and quiet   Cornea Clear Clear    Anterior Chamber Deep and quiet Deep and quiet   Iris PI PI   Lens Clear Clear   Anterior Vitreous Normal Normal         Fundus Exam       Right Left   Posterior Vitreous Posterior vitreous detachment    Disc Normal    C/D Ratio 0.5    Macula No overt CME, angulated vessels temporally, with thickening along the p.m. bundle.  Much less    Vessels Normal    Periphery Normal             IMAGING AND PROCEDURES  Imaging and Procedures for 09/01/21  Intravitreal Injection, Pharmacologic Agent - OD - Right Eye  Time Out 09/01/2021. 9:56 AM. Confirmed correct patient, procedure, site, and patient consented.   Anesthesia Topical anesthesia was used. Anesthetic medications included Lidocaine 4%.   Procedure Preparation included Tobramycin 0.3%, 10% betadine to eyelids, 5% betadine to ocular surface. A 30 gauge needle was used.   Injection: 2.5 mg bevacizumab 2.5 MG/0.1ML   Route: Intravitreal, Site: Right Eye   NDC: [phone], Lot: [id number]   Post-op Post injection exam found visual acuity of at least counting fingers, no retinal detachment, perfused optic nerve. The patient tolerated the procedure well. There were no complications. The patient received written and verbal post procedure care education. Post injection medications included ocuflox.      OCT, Retina - OU - Both Eyes       Right Eye Quality was good. Scan locations included subfoveal. Central Foveal Thickness: 247. Progression has improved. Findings include abnormal foveal contour, cystoid macular edema.   Left Eye Quality was good. Scan locations included subfoveal. Central Foveal Thickness: 227. Progression has been stable. Findings include abnormal foveal contour, cystoid macular edema.   Notes Chronic active CME OD and with peripapillary CNVM and thickening now improved 6 weeks post intravitreal Avastin OD.  We will repeat to control the peripapillary CNVM.  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HCC)  H35.3211 Intravitreal Injection, Pharmacologic Agent - OD - Right Eye    OCT, Retina - OU - Both Eyes    bevacizumab (AVASTIN) SOSY 2.5 mg    2. Cystoid macular edema of both eyes  H35.353     3. 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  We will repeat injection today  2.  Bilateral MAC-TEL will, no change with Avastin OD, chronic and continuing on CPAP use  3.  Ophthalmic Meds Ordered this visit:  Meds ordered this encounter  Medications   bevacizumab (AVASTIN) SOSY 2.5 mg       Return in about 6 weeks (around 10/13/2021) for dilate, OD, AVASTIN OCT.  There are no Patient Instructions on file for this visit.   Explained the diagnoses, plan, and follow up with the patient and they expressed understanding.  Patient expressed understanding of the importance of proper follow up care.   Clent Demark Jashley Yellin M.D. Diseases & Surgery of the Retina and Vitreous Retina & Diabetic [place] 09/01/21     Abbreviations: M myopia (nearsighted); A astigmatism; H hyperopia (farsighted); P presbyopia; Mrx spectacle prescription;  CTL contact lenses; OD right eye; OS left eye; OU both eyes  XT exotropia; ET esotropia; PEK punctate epithelial keratitis; PEE punctate epithelial erosions; DES dry eye syndrome; MGD meibomian gland dysfunction; ATs artificial tears; PFAT's  preservative free artificial tears; [place] nuclear sclerotic cataract; PSC posterior subcapsular cataract; ERM epi-retinal membrane; PVD posterior vitreous detachment; RD retinal detachment; DM diabetes mellitus; DR diabetic retinopathy; NPDR non-proliferative diabetic retinopathy; PDR proliferative diabetic retinopathy; CSME clinically significant macular edema; DME diabetic macular  edema; dbh dot blot hemorrhages; CWS cotton wool spot; POAG primary open angle glaucoma; C/D cup-to-disc ratio; HVF humphrey visual field; GVF goldmann visual field; OCT optical coherence tomography; IOP intraocular pressure; BRVO Branch retinal vein occlusion; CRVO central retinal vein occlusion; CRAO central retinal artery occlusion; BRAO branch retinal artery occlusion; RT retinal tear; SB scleral buckle; PPV pars plana vitrectomy; VH Vitreous hemorrhage; PRP panretinal laser photocoagulation; IVK intravitreal kenalog; VMT vitreomacular traction; MH Macular hole;  NVD neovascularization of the disc; NVE neovascularization elsewhere; AREDS age related eye disease study; ARMD age related macular degeneration; POAG primary open angle glaucoma; EBMD epithelial/anterior basement membrane dystrophy; ACIOL anterior chamber intraocular lens; IOL intraocular lens; PCIOL posterior chamber intraocular lens; Phaco/IOL phacoemulsification with intraocular lens placement; [place] photorefractive keratectomy; LASIK laser assisted in situ keratomileusis; HTN hypertension; DM diabetes mellitus; COPD chronic obstructive pulmonary disease

## 2021-09-01 NOTE — Assessment & Plan Note (Signed)
Stable chronic, secondary to MAC-TEL

## 2021-09-08 DIAGNOSIS — M461 Sacroiliitis, not elsewhere classified: Secondary | ICD-10-CM | POA: Diagnosis not present

## 2021-09-08 DIAGNOSIS — Z6826 Body mass index (BMI) 26.0-26.9, adult: Secondary | ICD-10-CM | POA: Diagnosis not present

## 2021-09-08 DIAGNOSIS — I10 Essential (primary) hypertension: Secondary | ICD-10-CM | POA: Diagnosis not present

## 2021-09-08 DIAGNOSIS — M7062 Trochanteric bursitis, left hip: Secondary | ICD-10-CM | POA: Diagnosis not present

## 2021-09-08 DIAGNOSIS — M47816 Spondylosis without myelopathy or radiculopathy, lumbar region: Secondary | ICD-10-CM | POA: Diagnosis not present

## 2021-09-08 DIAGNOSIS — M25559 Pain in unspecified hip: Secondary | ICD-10-CM | POA: Diagnosis not present

## 2021-09-13 ENCOUNTER — Ambulatory Visit (INDEPENDENT_AMBULATORY_CARE_PROVIDER_SITE_OTHER): Payer: Medicare HMO | Admitting: Family

## 2021-09-13 ENCOUNTER — Ambulatory Visit: Payer: Medicare HMO | Attending: Internal Medicine

## 2021-09-13 VITALS — BP 147/64 | HR 70 | Temp 97.9°F | Resp 16 | Wt 162.0 lb

## 2021-09-13 DIAGNOSIS — I10 Essential (primary) hypertension: Secondary | ICD-10-CM | POA: Diagnosis not present

## 2021-09-13 DIAGNOSIS — E785 Hyperlipidemia, unspecified: Secondary | ICD-10-CM

## 2021-09-13 DIAGNOSIS — E039 Hypothyroidism, unspecified: Secondary | ICD-10-CM | POA: Diagnosis not present

## 2021-09-13 DIAGNOSIS — R252 Cramp and spasm: Secondary | ICD-10-CM

## 2021-09-13 DIAGNOSIS — M1A0720 Idiopathic chronic gout, left ankle and foot, without tophus (tophi): Secondary | ICD-10-CM | POA: Diagnosis not present

## 2021-09-13 DIAGNOSIS — H8110 Benign paroxysmal vertigo, unspecified ear: Secondary | ICD-10-CM

## 2021-09-13 DIAGNOSIS — Z23 Encounter for immunization: Secondary | ICD-10-CM

## 2021-09-13 DIAGNOSIS — M461 Sacroiliitis, not elsewhere classified: Secondary | ICD-10-CM | POA: Diagnosis not present

## 2021-09-13 LAB — LIPID PANEL
Cholesterol: 117 mg/dL (ref 0–200)
HDL: 50.4 mg/dL (ref >39.00)
LDL Cholesterol: 50 mg/dL (ref 0–99)
NonHDL: 66.1
Total CHOL/HDL Ratio: 2
Triglycerides: 80 mg/dL (ref 0.0–149.0)
VLDL: 16 mg/dL (ref 0.0–40.0)

## 2021-09-13 LAB — MAGNESIUM: Magnesium: 2.2 mg/dL (ref 1.5–2.5)

## 2021-09-13 NOTE — Progress Notes (Signed)
° °  Covid-19 Vaccination Clinic  Name:  [name] Pratt    MRN: [id number] DOB: 1940/11/22  09/13/2021  [name] was observed post Covid-19 immunization for 15 minutes without incident. He was provided with Vaccine Information Sheet and instruction to access the V-Safe system.   [name] was instructed to call 911 with any severe reactions post vaccine: Difficulty breathing  Swelling of face and throat  A fast heartbeat  A bad rash all over body  Dizziness and weakness   Immunizations Administered     Name Date Dose VIS Date Route   Pfizer Covid-19 Vaccine Bivalent Booster 09/13/2021 10:30 AM 0.3 mL 05/11/2021 Intramuscular   Manufacturer: [place]   Lot: [id number]   [place]: [id number]

## 2021-09-13 NOTE — Patient Instructions (Addendum)
Please call Physical Therapy appointment for Vestibular Physical Therapy with April.

## 2021-09-13 NOTE — Progress Notes (Signed)
Subjective:     Patient ID: [name] Pratt, male    DOB: 10-31-1940, 81 y.o.   MRN: [id number]  Chief Complaint  Patient presents with   Hypertension    Here for follow up   Hypokalemia    Here for follow up   Dizziness    "Still having problems with vertigo"    HPI Patient is in today for follow up.  HTN- maintained on amlodipine and losartan. BP Readings from Last 3 Encounters:  09/13/21 (!) 147/64  08/27/21 (!) 159/69  08/01/21 126/60   Hypothyroid- Continues synthroid 60mcg.  Lab Results  Component Value Date   TSH 2.77 06/15/2021   Hyperlipidemia-  Lab Results  Component Value Date   CHOL 117 09/13/2021   HDL 50.40 09/13/2021   LDLCALC 50 09/13/2021   TRIG 80.0 09/13/2021   CHOLHDL 2 09/13/2021   Gout- denies any recent gout symptoms.   Vertigo- Reports that he has dizziness intermittently.  Had heart monitor.   Reports that   Low back pain- receiving ESI's with Dr. Brien Few.    Health Maintenance Due  Topic Date Due   TETANUS/TDAP  12/26/2020    Past Medical History:  Diagnosis Date   Allergic rhinitis 02/02/2016   Anemia 12/17/2009   Formatting of this note might be different from the original. 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 difficile colitis 04/11/2021   Cancer ([place])    skin cancer   Carotid stenosis    a. Carotid U/S 0/76: LICA < 80%, RICA 88-11%;  b.  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   Cystoid macular edema of both eyes 07/28/2020   Degenerative disc disease, lumbar 06/30/2015   Deviated nasal septum 02/18/2015   Diverticulosis 03/03/2020   DJD (degenerative joint disease) of hip 12/24/2012   Dyspnea 06/12/2011   Emphysema, unspecified ([place]) 03/05/2020   Erectile dysfunction 06/20/2017   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[place]) 02/09/2021   Gait disorder 03/02/2021   GERD (gastroesophageal reflux disease)    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   Qualifier: Diagnosis of  By: Burnett Kanaris     Greater trochanteric pain syndrome 07/22/2021   Hearing loss 02/02/2016   Heme positive stool 02/29/2020   HIATAL HERNIA 05/13/2009   Qualifier: Diagnosis of  By: Burnett Kanaris     High risk medication use 01/18/2012   Hip pain 03/02/2021   History of chicken pox    History of hemorrhoids    History of kidney stones    History of total bilateral knee replacement 07/08/2020   Hyperglycemia 03/03/2014   Hyperlipidemia    under control   Hyperlipidemia with target LDL less than 130 12/27/2010   Formatting of this note might be different from the original.  Cardiology - Dr Frances Nickels at [facility] cardiology  ICD-10 cut over   Hypertension    under control   Hypokalemia 07/08/2021   Hypothyroidism    Ileus ([place])    Internal hemorrhoids 03/03/2020   Leg cramps 11/13/2019   Lower GI bleed 12/12/2012   Lumbago of lumbar region with sciatica 10/21/2020   Lumbar radiculopathy 03/02/2021   Lumbar spondylosis 11/02/2020   Nasal congestion 04/11/2021   NEPHROLITHIASIS 05/13/2009   Qualifier: Diagnosis of  By: Burnett Kanaris     Nonspecific abnormal electrocardiogram (ECG) (EKG) 01/06/2013   Onychomycosis 06/30/2015   Osteoarthrosis, 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 Osteoarthritis Of The Hand  10/1 IMO update   Osteoarthrosis, unspecified whether generalized or localized, pelvic region and thigh 07/25/2010   Formatting of this note might be different from the original. Osteoarthritis Of The Hip   Other abnormal glucose 07/22/2021   Other allergic rhinitis 02/18/2015   Other ill-defined and unknown causes of morbidity and mortality 09/12/1999   Nov 20, 2000 Entered By: Alycia Patten Comment: ltFeb 23, 2006 Entered By: Alycia Patten Comment: rt 11/05   Peripheral neuropathy 06/19/2016   PONV (postoperative nausea and vomiting)    Preventative health care 11/24/2015   Right groin pain 11/29/2012   Right inguinal hernia 05/13/2009   Qualifier: Diagnosis of  By: Burnett Kanaris     RLS (restless legs syndrome) 02/18/2015   Rosacea    Rotator cuff tear 07/27/2013   Situational depression 03/02/2021   Sleep apnea with use of continuous positive airway pressure (CPAP) 07/15/2013   CPAP set to  7 cm water,  Residual AHi was 7.8  With no leak.  User time 6 hours.  479 days , not used only 12 days - highly compliant 06-23-13  .    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   Urinary retention 11/26/2020   Ventral hernia 07/08/2012   Vertigo 07/08/2021   Weight loss 03/03/2020    Past Surgical History:  Procedure Laterality Date   ABDOMINAL HERNIA REPAIR  07/15/12   Dr [name]   ANTERIOR CERVICAL DECOMP/DISCECTOMY FUSION N/A 07/09/2020   Procedure: ANTERIOR CERVICAL DECOMPRESSION AND FUSION CERVICAL THREE-FOUR.;  Surgeon: [name], MD;  Location: [facility];  Service: Neurosurgery;  Laterality: N/A;  anterior   BROW LIFT  05/07/01   CYSTOSCOPY WITH URETEROSCOPY AND STENT  PLACEMENT Right 01/28/2014   Procedure: CYSTOSCOPY WITH URETEROSCOPY, BASKET RETRIVAL AND  STENT PLACEMENT;  Surgeon: [name], MD;  Location: [facility];  Service: Urology;  Laterality: Right;   epidural injections     multiple procedures   ESOPHAGEAL DILATION  1993 and 1994   multiple times   EYE SURGERY Bilateral 03/25/10, 2012   cataract removal   HEMORRHOID SURGERY  2014   HIATAL HERNIA REPAIR  12/21/93   HOLMIUM LASER APPLICATION Right 04/20/1750   Procedure: HOLMIUM LASER APPLICATION;  Surgeon: [name], MD;  Location: [facility];  Service: Urology;  Laterality: Right;   INGUINAL HERNIA REPAIR Right 07/15/12   Dr [name], x2   JOINT REPLACEMENT  07/25/04   right knee   JOINT REPLACEMENT  07/13/10   left knee   KNEE SURGERY  [place]  09/20/06   with intraoperative cholangiogram and right inguinal herniorrhaphy with mesh    LIGAMENT REPAIR Left 07/2013   shoulder   LITHOTRIPSY     x2   [place] FUSION/FORAMINOTOMY N/A 11/24/2020   Procedure: Posterior Cervical Laminectomy Cervical three-four, Cervical four-five with lateral mass fusion;  Surgeon: [name], MD;  Location: [facility];  Service: Neurosurgery;  Laterality: N/A;   REFRACTIVE SURGERY Bilateral    Right total hip replacement  4/14   SKIN CANCER DESTRUCTION     nose, ear   TONSILLECTOMY  as child   TOTAL HIP ARTHROPLASTY Left    URETHRAL DILATION  1991   [name]    Family History  Problem Relation Age of Onset   Cancer Mother    Hypertension Other    Cancer Other    Osteoarthritis Other     Social History   Socioeconomic History   Marital status: Married    Spouse name: Not on file   Number of children: 1   Years of education: Not on file   Highest education level: Not on file  Occupational History   Occupation: firefighter    Comment: paints on the side  Tobacco Use   Smoking status: Former    Years: 11.00    Types: Cigarettes     Quit date: 09/11/1978    Years since quitting: 43.0   Smokeless tobacco: Never  Vaping Use   Vaping Use: Never used  Substance and Sexual Activity   Alcohol use: Not Currently    Alcohol/week: 0.0 standard drinks    Comment: rare   Drug use: No   Sexual activity: Not Currently  Other Topics Concern   Not on file  Social History Narrative   Lives with his wife   He has one son- lives locally.   2 grandchildren   Has worked for [employer], trade union   Enjoys wood working   Completed HS.  [employer]   Social Determinants of Radio broadcast assistant Strain: Low Risk    Difficulty of Paying Living Expenses: Not very hard  Food Insecurity: Not on file  Transportation Needs: No Transportation Needs   Lack of Transportation (Medical): No   Lack of Transportation (Non-Medical): No  Physical Activity: Insufficiently Active   Days of Exercise per Week: 2 days   Minutes of Exercise per Session: 30 min  Stress: Not on file  Social Connections: Not on file  Intimate Partner Violence: Not on file    Outpatient Medications Prior to Visit  Medication Sig Dispense Refill   acetaminophen (TYLENOL) 650 MG CR tablet Take 1,300 mg by mouth every morning. And take 650 mg at bedtime     albuterol (VENTOLIN HFA) 108 (90 Base) MCG/ACT inhaler Inhale 2 puffs into the lungs every 6 (six) hours as needed for shortness of breath. 1 each 3   Alcohol Swabs (ALCOHOL PADS) 70 % PADS 1 packet by Does not apply route 2 (two) times daily. 100 each 1   allopurinol (ZYLOPRIM) 100 MG tablet Take 100 mg by mouth 2 (two) times daily.     amLODipine (NORVASC) 10 MG tablet Take 1 tablet (10 mg total) by mouth daily. 90 tablet 1   aspirin EC 81 MG tablet Take 81 mg by mouth every morning.     Cholecalciferol (VITAMIN D) 50 MCG (2000 UT) CAPS Take 2,000 Units by mouth daily.     colchicine  0.6 MG tablet Take 0.6 mg by mouth as needed for other. For gout     cycloSPORINE (RESTASIS) 0.05 % ophthalmic emulsion Place 1  drop into both eyes 2 (two) times daily.     diclofenac Sodium (VOLTAREN) 1 % GEL Apply 1 application topically daily as needed for pain (Hand pain).     fluocinonide (LIDEX) 0.05 % external solution Apply 1 application topically 2 (two) times daily as needed for itching or pain.     gabapentin (NEURONTIN) 300 MG capsule Take 300 mg by mouth 2 (two) times daily as needed. Leg and nerve pain     glucose blood test strip TRUE Metrix Blood Glucose Test Strip, use as instructed 100 each 12   Homeopathic Products (THERAWORX RELIEF) FOAM Apply 1 application topically 2 (two) times daily as needed (Pain).     hydrALAZINE (APRESOLINE) 50 MG tablet Take 1 tablet (50 mg total) by mouth 2 (two) times daily. 60 tablet 2   Lancets MISC 1 each by Does not apply route 2 (two) times daily. TRUE matrix lancets 100 each 3   levothyroxine (SYNTHROID) 75 MCG tablet TAKE 2 TABS BY MOUTH ONCE DAILY 6 DAYS A WEEK AND 1 TAB BY MOUTH ONCE DAILY ONE DAY A WEEK 167 tablet o   losartan (COZAAR) 100 MG tablet TAKE 1 TABLET EVERY DAY 90 tablet 1   meclizine (ANTIVERT) 25 MG tablet Take 1 tablet (25 mg total) by mouth 3 (three) times daily as needed for dizziness. 20 tablet 0   mirabegron ER (MYRBETRIQ) 25 MG TB24 tablet Take 25 mg by mouth daily.     multivitamin (THERAGRAN) per tablet Take 1 tablet by mouth daily.     nitroGLYCERIN (NITROSTAT) 0.4 MG SL tablet Place 1 tablet (0.4 mg total) under the tongue every 5 (five) minutes as needed for chest pain. 25 tablet 3   polyethylene glycol (MIRALAX / GLYCOLAX) 17 g packet Take 17-34 g by mouth daily as needed.     rosuvastatin (CRESTOR) 5 MG tablet Take 5 mg by mouth daily.     sodium chloride (OCEAN) 0.65 % nasal spray Place 1 spray into both nostrils at bedtime as needed for congestion.     No facility-administered medications prior to visit.    Allergies  Allergen Reactions   Pravastatin Other (See Comments)    Muscle cramps   Sildenafil Other (See Comments)     Tachycardia     Oxycodone     hallucinations   Atenolol Other (See Comments)    bradycardia   Elastic Bandages & [Zinc] Other (See Comments)    Teflon bandages - Irritation   Metoclopramide Hcl Anxiety    ROS See HPI    Objective:    Physical Exam Constitutional:      General: He is not in acute distress.    Appearance: He is well-developed.  HENT:     Head: Normocephalic and atraumatic.  Cardiovascular:     Rate and Rhythm: Normal rate and regular rhythm.     Heart sounds: No murmur heard. Pulmonary:     Effort: Pulmonary effort is normal. No respiratory distress.     Breath sounds: Normal breath sounds. No wheezing or rales.  Skin:    General: Skin is warm and dry.  Neurological:     Mental Status: He is alert and oriented to person, place, and time.  Psychiatric:        Behavior: Behavior normal.        Thought Content:  Thought content normal.    BP (!) 147/64 (BP Location: Right Arm, Patient Position: Sitting, Cuff Size: Small)    Pulse 70    Temp 97.9 F (36.6 C) (Oral)    Resp 16    Wt 162 lb (73.5 kg)    SpO2 98%    BMI 26.15 kg/m  Wt Readings from Last 3 Encounters:  09/13/21 162 lb (73.5 kg)  08/27/21 155 lb (70.3 kg)  08/02/21 161 lb (73 kg)       Assessment & Plan:   Problem List Items Addressed This Visit       Unprioritized   Leg cramps   Relevant Orders   Magnesium (Completed)   Hypothyroidism    Clinically stable on synthroid, continue current dose.       Hypertension    BP acceptable for his age. Continue amlodipine/losartan.       Hyperlipidemia - Primary    Lab Results  Component Value Date   CHOL 117 09/13/2021   HDL 50.40 09/13/2021   LDLCALC 50 09/13/2021   TRIG 80.0 09/13/2021   CHOLHDL 2 09/13/2021  Lipids at goal. Continue crestor 5mg .       Relevant Orders   Lipid panel (Completed)   GOUT    Stable on allopurinol. Continue same.       Benign paroxysmal positional vertigo    Notes some intermittent symptoms.  Has done vestibular rehab in the past. Recommended that he schedule follow up with his PT for BPPV treatment.        I am having Courvoisier B. Grassel "[name]" maintain his multivitamin, cycloSPORINE, sodium chloride, aspirin EC, nitroGLYCERIN, albuterol, acetaminophen, Theraworx Relief, Vitamin D, glucose blood, Lancets, diclofenac Sodium, Alcohol Pads, gabapentin, amLODipine, fluocinonide, allopurinol, colchicine, rosuvastatin, mirabegron ER, polyethylene glycol, losartan, levothyroxine, meclizine, and hydrALAZINE.  No orders of the defined types were placed in this encounter.

## 2021-09-14 ENCOUNTER — Telehealth: Payer: Self-pay | Admitting: Family

## 2021-09-14 DIAGNOSIS — R269 Unspecified abnormalities of gait and mobility: Secondary | ICD-10-CM

## 2021-09-14 DIAGNOSIS — H8110 Benign paroxysmal vertigo, unspecified ear: Secondary | ICD-10-CM

## 2021-09-14 DIAGNOSIS — R42 Dizziness and giddiness: Secondary | ICD-10-CM

## 2021-09-14 HISTORY — DX: Benign paroxysmal vertigo, unspecified ear: H81.10

## 2021-09-14 NOTE — Telephone Encounter (Signed)
Patient would like to start vestibular physical therapy for his vertigo, and also regular physical therapy for balance, walking and to build stamina. Please advise.

## 2021-09-14 NOTE — Assessment & Plan Note (Signed)
BP acceptable for his age. Continue amlodipine/losartan.

## 2021-09-14 NOTE — Assessment & Plan Note (Signed)
Notes some intermittent symptoms. Has done vestibular rehab in the past. Recommended that he schedule follow up with his PT for BPPV treatment.

## 2021-09-14 NOTE — Assessment & Plan Note (Signed)
Clinically stable on synthroid, continue current dose.

## 2021-09-14 NOTE — Assessment & Plan Note (Signed)
Lab Results  Component Value Date   CHOL 117 09/13/2021   HDL 50.40 09/13/2021   LDLCALC 50 09/13/2021   TRIG 80.0 09/13/2021   CHOLHDL 2 09/13/2021   Lipids at goal. Continue crestor 5mg .

## 2021-09-14 NOTE — Assessment & Plan Note (Signed)
Stable on allopurinol.  Continue same.  

## 2021-09-15 ENCOUNTER — Other Ambulatory Visit (HOSPITAL_BASED_OUTPATIENT_CLINIC_OR_DEPARTMENT_OTHER): Payer: Self-pay

## 2021-09-15 MED ORDER — PFIZER COVID-19 VAC BIVALENT 30 MCG/0.3ML IM SUSP
INTRAMUSCULAR | 0 refills | Status: DC
  Filled 2021-09-15: qty 0.3, 1d supply, fill #0

## 2021-09-16 ENCOUNTER — Other Ambulatory Visit: Payer: Self-pay

## 2021-09-16 MED ORDER — NITROGLYCERIN 0.4 MG SL SUBL: 0.4000 mg | SUBLINGUAL_TABLET | SUBLINGUAL | 6 refills | Status: DC | PRN

## 2021-09-28 DIAGNOSIS — M461 Sacroiliitis, not elsewhere classified: Secondary | ICD-10-CM | POA: Diagnosis not present

## 2021-10-05 ENCOUNTER — Encounter: Payer: Self-pay | Admitting: Physical Therapy

## 2021-10-05 ENCOUNTER — Ambulatory Visit: Payer: Medicare HMO | Attending: Family | Admitting: Physical Therapy

## 2021-10-05 ENCOUNTER — Other Ambulatory Visit: Payer: Self-pay

## 2021-10-05 DIAGNOSIS — R42 Dizziness and giddiness: Secondary | ICD-10-CM | POA: Diagnosis not present

## 2021-10-05 DIAGNOSIS — R2689 Other abnormalities of gait and mobility: Secondary | ICD-10-CM | POA: Diagnosis not present

## 2021-10-05 DIAGNOSIS — M6281 Muscle weakness (generalized): Secondary | ICD-10-CM | POA: Insufficient documentation

## 2021-10-05 DIAGNOSIS — R2681 Unsteadiness on feet: Secondary | ICD-10-CM | POA: Diagnosis not present

## 2021-10-05 DIAGNOSIS — R269 Unspecified abnormalities of gait and mobility: Secondary | ICD-10-CM | POA: Diagnosis not present

## 2021-10-05 NOTE — Therapy (Signed)
[facility] [address]  [place], [place], [address] Phone: [phone]   Fax:  [phone]  Physical Therapy Evaluation  Patient Details  Name: [name] Pratt MRN: [id number] Date of Birth: [birth year] Referring Provider (PT): [name], NP   Encounter Date: 10/05/2021   PT End of Session - 10/05/21 1056     Visit Number 1    Number of Visits 16    Date for PT Re-Evaluation 11/30/21    Authorization Type [name]    Progress Note Due on Visit 10    PT Start Time 0935    PT Stop Time 1015    PT Time Calculation (min) 40 min    Activity Tolerance Patient tolerated treatment well    Behavior During Therapy [facility] for tasks assessed/performed             Past Medical History:  Diagnosis Date   Allergic rhinitis 02/02/2016   Anemia 12/17/2009   Formatting of this note might be different from the original. Anemia  10/1 IMO update   Aortic atherosclerosis ([place]) 03/05/2020   Arthritis    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   Atypical chest pain 11/24/2015   Benign essential hypertension 05/13/2009   Qualifier: Diagnosis of  By: Larwance Sachs of this note might be different from the original. Hypertension   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[place]) 07/22/2021   Body mass index (BMI) 25.0-25.9, adult 11/02/2020   C. difficile colitis 04/11/2021   Cancer ([place])    skin cancer   Carotid stenosis    a. Carotid U/S 0/86: LICA < 76%, RICA 19-50%;  b.  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   Cystoid macular edema of both eyes 07/28/2020   Degenerative disc disease, lumbar  06/30/2015   Deviated nasal septum 02/18/2015   Diverticulosis 03/03/2020   DJD (degenerative joint disease) of hip 12/24/2012   Dyspnea 06/12/2011   Emphysema, unspecified ([place]) 03/05/2020   Erectile dysfunction 06/20/2017   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[place]) 02/09/2021   Gait disorder 03/02/2021   GERD (gastroesophageal reflux disease)    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   Qualifier: Diagnosis of  By: Burnett Kanaris     Greater trochanteric pain syndrome 07/22/2021   Hearing loss 02/02/2016   Heme positive stool 02/29/2020   HIATAL HERNIA 05/13/2009   Qualifier: Diagnosis of  By: Burnett Kanaris     High risk medication use 01/18/2012   Hip pain 03/02/2021   History of chicken pox    History of hemorrhoids    History of kidney stones    History of total bilateral knee replacement 07/08/2020   Hyperglycemia 03/03/2014   Hyperlipidemia    under control   Hyperlipidemia with target LDL less than 130 12/27/2010   Formatting of this note might be different from the original. Cardiology - Dr Frances Nickels at [facility] cardiology  ICD-10 cut over   Hypertension    under control   Hypokalemia 07/08/2021   Hypothyroidism    Ileus ([place])    Internal hemorrhoids 03/03/2020   Leg cramps 11/13/2019   Lower GI bleed  12/12/2012   Lumbago of lumbar region with sciatica 10/21/2020   Lumbar radiculopathy 03/02/2021   Lumbar spondylosis 11/02/2020   Nasal congestion 04/11/2021   NEPHROLITHIASIS 05/13/2009   Qualifier: Diagnosis of  By: Burnett Kanaris     Nonspecific abnormal electrocardiogram (ECG) (EKG) 01/06/2013   Onychomycosis 06/30/2015   Osteoarthrosis, 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 Osteoarthritis Of The Hand  10/1 IMO update   Osteoarthrosis, unspecified whether generalized or localized, pelvic region and thigh 07/25/2010   Formatting of this note might be different from the original. Osteoarthritis Of The Hip    Other abnormal glucose 07/22/2021   Other allergic rhinitis 02/18/2015   Other ill-defined and unknown causes of morbidity and mortality 09/12/1999   Nov 20, 2000 Entered By: Alycia Patten Comment: ltFeb 23, 2006 Entered By: Alycia Patten Comment: rt 11/05   Peripheral neuropathy 06/19/2016   PONV (postoperative nausea and vomiting)    Preventative health care 11/24/2015   Right groin pain 11/29/2012   Right inguinal hernia 05/13/2009   Qualifier: Diagnosis of  By: Burnett Kanaris     RLS (restless legs syndrome) 02/18/2015   Rosacea    Rotator cuff tear 07/27/2013   Situational depression 03/02/2021   Sleep apnea with use of continuous positive airway pressure (CPAP) 07/15/2013   CPAP set to  7 cm water,  Residual AHi was 7.8  With no leak.  User time 6 hours.  479 days , not used only 12 days - highly compliant 06-23-13  .    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   Urinary retention 11/26/2020   Ventral hernia 07/08/2012   Vertigo 07/08/2021   Weight loss 03/03/2020    Past Surgical History:  Procedure Laterality Date   ABDOMINAL HERNIA REPAIR  07/15/12   Dr [name]   ANTERIOR CERVICAL DECOMP/DISCECTOMY FUSION N/A 07/09/2020   Procedure: ANTERIOR CERVICAL DECOMPRESSION AND FUSION CERVICAL THREE-FOUR.;  Surgeon: [name], MD;  Location: [facility];  Service: Neurosurgery;  Laterality: N/A;  anterior   BROW LIFT  05/07/01   CYSTOSCOPY WITH URETEROSCOPY AND STENT PLACEMENT Right 01/28/2014   Procedure: CYSTOSCOPY WITH URETEROSCOPY, BASKET RETRIVAL AND  STENT PLACEMENT;  Surgeon: [name], MD;  Location: [facility];  Service: Urology;  Laterality: Right;   epidural injections     multiple procedures    ESOPHAGEAL DILATION  1993 and 1994   multiple times   EYE SURGERY Bilateral 03/25/10, 2012   cataract removal   HEMORRHOID SURGERY  2014   HIATAL HERNIA REPAIR  12/21/93   HOLMIUM LASER APPLICATION Right 06/04/2682   Procedure: HOLMIUM LASER APPLICATION;  Surgeon: [name], MD;  Location: [facility];  Service: Urology;  Laterality: Right;   INGUINAL HERNIA REPAIR Right 07/15/12   Dr [name], x2   JOINT REPLACEMENT  07/25/04   right knee   JOINT REPLACEMENT  07/13/10   left knee   [place]  09/20/06   with intraoperative cholangiogram and right inguinal herniorrhaphy with mesh    LIGAMENT REPAIR Left 07/2013   shoulder   LITHOTRIPSY     x2   [place]  CERVICAL FUSION/FORAMINOTOMY N/A 11/24/2020   Procedure: Posterior Cervical Laminectomy Cervical three-four, Cervical four-five with lateral mass fusion;  Surgeon: [name], MD;  Location: [facility];  Service: Neurosurgery;  Laterality: N/A;   REFRACTIVE SURGERY Bilateral    Right total hip replacement  4/14   SKIN CANCER DESTRUCTION     nose, ear   TONSILLECTOMY  as child   TOTAL HIP ARTHROPLASTY Left    URETHRAL DILATION  1991   [name]    There were no vitals filed for this visit.    Subjective Assessment - 10/05/21 0943     Subjective Pt. returning to PT after having heart procedure.  He reports "there was nothing wrong with it."  He has had vertigo return, ED on 08/27/21 with vergito    Pertinent History spondylitic myelopathy, ACDF, HTN, COPD, cervical decompression, sleep apnea    Patient Stated Goals "get a lot better."    Currently in Pain? No/denies                [facility] PT Assessment - 10/05/21 0001       Assessment   Medical Diagnosis R26.9 (ICD-10-CM) - Gait disorder  R42 (ICD-10-CM) - Vertigo    Referring Provider (PT) [name], NP    Onset Date/Surgical Date 08/27/21    Hand Dominance Right      Precautions   Precautions  Fall      Restrictions   Weight Bearing Restrictions No      Balance Screen   Has the patient fallen in the past 6 months No    Has the patient had a decrease in activity level because of a fear of falling?  Yes    Is the patient reluctant to leave their home because of a fear of falling?  Yes      [place] Private residence    Living Arrangements Spouse/significant other    Type of [place] to enter    Home Layout Two level    Alternate Level Stairs-Number of Steps [place] bars - tub/shower;Shower seat - built in      Prior Function   Level of Independence Independent with community mobility with device    Vocation Retired      Observation/Other Assessments   Observations enters with visually slow gait speed, very wide BOS, SPC, unsteady    Focus on Therapeutic Outcomes (FOTO)  vestibular 58%.  62% predicted after 7 visits      Posture/Postural Control   Posture/Postural Control Postural limitations    Postural Limitations Rounded Shoulders;Forward head      AROM   Overall AROM  Deficits    Overall AROM Comments decreased cervical ROM, history of neck surgery anterior and posterior      Transfers   Five time sit to stand comments  21 seconds      Ambulation/Gait   Assistive device Straight cane    Gait Pattern Step-through pattern;Decreased dorsiflexion - right;Decreased dorsiflexion - left;Trendelenburg;Wide base of support      Timed Up and Go Test   TUG Normal TUG    Normal TUG (seconds) 18.75    TUG Comments without AD, SBA for safety                    Vestibular Assessment - 10/05/21 0001       Dix-Hallpike Right   Dix-Hallpike Right Duration 30  Dix-Hallpike Right Symptoms Upbeat, right rotatory nystagmus      Dix-Hallpike Left   Dix-Hallpike Left Duration 30    Dix-Hallpike Left Symptoms Upbeat, left rotatory nystagmus                Objective measurements  completed on examination: See above findings.        Vestibular Treatment/Exercise - 10/05/21 0001       Vestibular Treatment/Exercise   Vestibular Treatment Provided Canalith Repositioning    Canalith Repositioning Epley Manuever Right       EPLEY MANUEVER RIGHT   Number of Reps  1    Overall Response Improved Symptoms    Response Details  --   less dizziness with retest                     PT Short Term Goals - 10/05/21 1049       PT SHORT TERM GOAL #1   Title Pt. will complete Berg.    Time 2    Period Weeks    Status New    Target Date 10/19/21               PT Long Term Goals - 10/05/21 1046       PT LONG TERM GOAL #1   Title The patient will be indep with progressed HEP for gait and balance.    Time 8    Period Weeks    Status New    Target Date 11/30/21      PT LONG TERM GOAL #2   Title The patient will improve Berg balance scale to 45/56 to decrease risk of falls.    Baseline not assessed today    Time 8    Period Weeks    Status New    Target Date 11/30/21      PT LONG TERM GOAL #3   Title Patient will demonstrate improved functional strength by completeing 5x STS in < 16 seconds.    Baseline 10/05/21- 21 seconds.    Time 8    Period Weeks    Status New    Target Date 11/30/21      PT LONG TERM GOAL #4   Title Pt will maintain TUG at 14.5 seconds to demo maintained mobility    Baseline 10/05/21- 18.75 seconds without cane    Time 8    Period Weeks    Status New    Target Date 11/30/21      PT LONG TERM GOAL #5   Title Pt will demo no s/s of BPPV in all positions of canalith testing    Time 8    Period Weeks    Status New    Target Date 11/30/21                    Plan - 10/05/21 1057     Clinical Impression Statement Mr. Tarquin Welcher is an 81 year old male who previously received PT for gait/balance and vertigo, reporting good progress but discharged on 07/29/21 due to other health concerns and procedures.   He reports vertigo has returned, seen in ED on 08/27/21 for vertigo and given prescription for meclizine. He also reports balance has declined since stopping PT, but has been performing HEP.  Today he demonstrates bilateral posterior canalithiasis, treated for R side today due to reporting more symptoms with rolling to R side.  His TUG score has decreased since last seen, due to time not able  to perform BERG today but noted very unsteady with gait.  He would benefit from skilled physical therapy to resolve vertigo and improve gait and balance and decrease risk of falls.    Personal Factors and Comorbidities Comorbidity 3+;Time since onset of injury/illness/exacerbation    Comorbidities HTN, COPD, spondylitis myelopathy, falls    Examination-Activity Limitations Squat;Stairs;Locomotion Level;Bed Mobility;Bend    Examination-Participation Restrictions [employer] Activity    Stability/Clinical Decision Making Evolving/Moderate complexity    Clinical Decision Making Moderate    Rehab Potential Good    PT Frequency 2x / week    PT Duration 8 weeks    PT Treatment/Interventions ADLs/Self Care Home Management;Patient/family education;Balance training;Neuromuscular re-education;Gait training;Therapeutic activities;Therapeutic exercise;Functional mobility training;Taping;Manual techniques;Vestibular;Canalith Repostioning    PT Next Visit Plan reassess BPPV, Berg balance test    Consulted and Agree with Plan of Care Patient             Patient will benefit from skilled therapeutic intervention in order to improve the following deficits and impairments:  Pain, Decreased activity tolerance, Decreased balance, Decreased strength, Decreased range of motion, Impaired flexibility, Impaired tone, Increased fascial restricitons, Dizziness, Abnormal gait  Visit Diagnosis: Dizziness and giddiness  Muscle weakness (generalized)  Unsteadiness on feet  Other abnormalities of gait and  mobility     Problem List Patient Active Problem List   Diagnosis Date Noted   Benign paroxysmal positional vertigo 09/14/2021   Palpitations 07/25/2021   Cardiac murmur 07/25/2021   History of chicken pox 07/22/2021   History of hemorrhoids 07/22/2021   Hypertension 07/22/2021   COPD (chronic obstructive pulmonary disease) ([place]) 07/22/2021   History of kidney stones 07/22/2021   Cancer ([place]) 40/98/1191   Complication of anesthesia 07/22/2021   PONV (postoperative nausea and vomiting) 07/22/2021   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[place]) 07/22/2021   Other abnormal glucose 07/22/2021   Spondylitis ([place]) 07/22/2021   Greater trochanteric pain syndrome 07/22/2021   Vertigo 07/08/2021   Hypokalemia 07/08/2021   Trochanteric bursitis of left hip 06/30/2021   C. 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[place]) 02/09/2021   Urinary retention 11/26/2020   Cervical myelopathy ([place]) 11/24/2020   Body mass index (BMI) 25.0-25.9, adult 11/02/2020   Lumbar spondylosis 11/02/2020   Lumbago of lumbar region with sciatica 10/21/2020   Cystoid macular edema of both eyes 07/28/2020   Status post cervical spinal fusion 07/09/2020   History of total bilateral knee replacement 07/08/2020   Aortic atherosclerosis ([place]) 03/05/2020   Emphysema, unspecified ([place]) 03/05/2020   Arthritis 03/04/2020   Diverticulosis 03/03/2020   Internal hemorrhoids 03/03/2020   Weight loss 03/03/2020   Heme positive stool 02/29/2020   Leg cramps 11/13/2019   Tibialis anterior tendon tear, nontraumatic 11/13/2019   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   CPAP (continuous positive airway pressure) dependence 03/25/2018   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   Erectile dysfunction 06/20/2017   Stenosis  of right carotid 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 03/06/2017   Peripheral neuropathy 06/19/2016   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   Hearing loss 02/02/2016   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   Allergic rhinitis 02/02/2016   Atypical chest pain 11/24/2015   Preventative health care 11/24/2015   Onychomycosis 06/30/2015   Degenerative disc disease, lumbar 06/30/2015   Other allergic rhinitis 02/18/2015   Deviated nasal septum  02/18/2015   RLS (restless legs syndrome) 02/18/2015   GERD (gastroesophageal reflux disease) 09/18/2014   Hyperglycemia 03/03/2014   Rotator cuff tear 07/27/2013   Sleep apnea with use of continuous positive airway pressure (CPAP) 07/15/2013   Carotid stenosis 02/24/2013   Nonspecific abnormal electrocardiogram (ECG) (EKG) 01/06/2013   DJD (degenerative joint disease) of hip 12/24/2012   Lower GI bleed 12/12/2012   Right groin pain 11/29/2012   Ventral hernia 07/08/2012   High risk medication use 01/18/2012   Dyspnea 06/12/2011   Hyperlipidemia with target LDL less than 130 12/27/2010   Osteoarthrosis, unspecified whether generalized or localized, pelvic region and thigh 07/25/2010   Osteoarthrosis, hand 06/13/2010   Anemia 12/17/2009   Hypothyroidism 05/13/2009   Hyperlipidemia 05/13/2009   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   Benign essential hypertension 05/13/2009   Right inguinal hernia 05/13/2009   HIATAL HERNIA 05/13/2009   NEPHROLITHIASIS 05/13/2009   ROSACEA 05/13/2009   Other ill-defined and unknown causes of morbidity and mortality 09/12/1999    Rennie Natter, PT, DPT  10/05/2021, 11:54 AM  [facility] [address]  [place], [place], [address] Phone: [phone]   Fax:  [phone]  Name: DILLAN CANDELA MRN: [id number] Date of Birth: 1941-09-10

## 2021-10-07 DIAGNOSIS — N1832 Chronic kidney disease, stage 3b: Secondary | ICD-10-CM | POA: Diagnosis not present

## 2021-10-07 DIAGNOSIS — Z87442 Personal history of urinary calculi: Secondary | ICD-10-CM | POA: Diagnosis not present

## 2021-10-07 DIAGNOSIS — I129 Hypertensive chronic kidney disease with stage 1 through stage 4 chronic kidney disease, or unspecified chronic kidney disease: Secondary | ICD-10-CM | POA: Diagnosis not present

## 2021-10-07 DIAGNOSIS — E785 Hyperlipidemia, unspecified: Secondary | ICD-10-CM | POA: Diagnosis not present

## 2021-10-07 DIAGNOSIS — D649 Anemia, unspecified: Secondary | ICD-10-CM | POA: Diagnosis not present

## 2021-10-07 DIAGNOSIS — M5412 Radiculopathy, cervical region: Secondary | ICD-10-CM | POA: Diagnosis not present

## 2021-10-07 DIAGNOSIS — H8110 Benign paroxysmal vertigo, unspecified ear: Secondary | ICD-10-CM | POA: Diagnosis not present

## 2021-10-13 ENCOUNTER — Ambulatory Visit: Payer: Medicare HMO | Attending: Family | Admitting: Physical Therapy

## 2021-10-13 ENCOUNTER — Encounter (INDEPENDENT_AMBULATORY_CARE_PROVIDER_SITE_OTHER): Payer: Self-pay | Admitting: Ophthalmology

## 2021-10-13 ENCOUNTER — Ambulatory Visit (INDEPENDENT_AMBULATORY_CARE_PROVIDER_SITE_OTHER): Payer: Medicare HMO | Admitting: Ophthalmology

## 2021-10-13 ENCOUNTER — Telehealth: Payer: Self-pay | Admitting: Adult Health

## 2021-10-13 ENCOUNTER — Other Ambulatory Visit: Payer: Self-pay

## 2021-10-13 ENCOUNTER — Encounter (INDEPENDENT_AMBULATORY_CARE_PROVIDER_SITE_OTHER): Payer: Medicare HMO | Admitting: Ophthalmology

## 2021-10-13 DIAGNOSIS — H8111 Benign paroxysmal vertigo, right ear: Secondary | ICD-10-CM | POA: Insufficient documentation

## 2021-10-13 DIAGNOSIS — G4730 Sleep apnea, unspecified: Secondary | ICD-10-CM | POA: Diagnosis not present

## 2021-10-13 DIAGNOSIS — R2681 Unsteadiness on feet: Secondary | ICD-10-CM | POA: Insufficient documentation

## 2021-10-13 DIAGNOSIS — H353211 Exudative age-related macular degeneration, right eye, with active choroidal neovascularization: Secondary | ICD-10-CM | POA: Diagnosis not present

## 2021-10-13 DIAGNOSIS — M6281 Muscle weakness (generalized): Secondary | ICD-10-CM | POA: Diagnosis not present

## 2021-10-13 DIAGNOSIS — H35073 Retinal telangiectasis, bilateral: Secondary | ICD-10-CM

## 2021-10-13 DIAGNOSIS — R29818 Other symptoms and signs involving the nervous system: Secondary | ICD-10-CM | POA: Diagnosis not present

## 2021-10-13 DIAGNOSIS — R2689 Other abnormalities of gait and mobility: Secondary | ICD-10-CM | POA: Diagnosis not present

## 2021-10-13 DIAGNOSIS — R42 Dizziness and giddiness: Secondary | ICD-10-CM | POA: Diagnosis not present

## 2021-10-13 DIAGNOSIS — G4733 Obstructive sleep apnea (adult) (pediatric): Secondary | ICD-10-CM

## 2021-10-13 MED ORDER — BEVACIZUMAB 2.5 MG/0.1ML IZ SOSY
2.5000 mg | PREFILLED_SYRINGE | INTRAVITREAL | Status: AC | PRN
  Administered 2021-10-13: 2.5 mg via INTRAVITREAL

## 2021-10-13 NOTE — Progress Notes (Signed)
10/13/2021     CHIEF COMPLAINT Patient presents for  Chief Complaint  Patient presents with   Retina Follow Up      HISTORY OF PRESENT ILLNESS: [name] Pratt is a 81 y.o. male who presents to the clinic today for:   HPI     Retina Follow Up           Diagnosis: Wet AMD   Laterality: right eye   Onset: 6 weeks ago   Severity: mild   Duration: 6 weeks         Comments   6 weeks dilate OD, Avastin OD, OCT. Pt states "vision fluctuates. Depends on how much I use the t.v. and computer. I have some irritation in my eyes and it feels like gravel in my eyes. I use a CPAP and the air blows on my eyes all night." Pt denies new FOL or floaters.      Last edited by Laurin Coder on 10/13/2021  9:17 AM.      Referring physician: [name], NP [address] [place],  [place] [address]  HISTORICAL INFORMATION:   Selected notes from the MEDICAL RECORD NUMBER    Lab Results  Component Value Date   HGBA1C 5.7 12/29/2020     CURRENT MEDICATIONS: Current Outpatient Medications (Ophthalmic Drugs)  Medication Sig   cycloSPORINE (RESTASIS) 0.05 % ophthalmic emulsion Place 1 drop into both eyes 2 (two) times daily.   No current facility-administered medications for this visit. (Ophthalmic Drugs)   Current Outpatient Medications (Other)  Medication Sig   acetaminophen (TYLENOL) 650 MG CR tablet Take 1,300 mg by mouth every morning. And take 650 mg at bedtime   albuterol (VENTOLIN HFA) 108 (90 Base) MCG/ACT inhaler Inhale 2 puffs into the lungs every 6 (six) hours as needed for shortness of breath.   Alcohol Swabs (ALCOHOL PADS) 70 % PADS 1 packet by Does not apply route 2 (two) times daily.   allopurinol (ZYLOPRIM) 100 MG tablet Take 100 mg by mouth 2 (two) times daily.   amLODipine (NORVASC) 10 MG tablet Take 1 tablet (10 mg total) by mouth daily.   aspirin EC 81 MG tablet Take 81 mg by mouth every morning.   Cholecalciferol (VITAMIN D) 50  MCG (2000 UT) CAPS Take 2,000 Units by mouth daily.   colchicine 0.6 MG tablet Take 0.6 mg by mouth as needed for other. For gout   COVID-19 mRNA bivalent vaccine, Pfizer, (PFIZER COVID-19 VAC BIVALENT) injection Inject into the muscle.   diclofenac Sodium (VOLTAREN) 1 % GEL Apply 1 application topically daily as needed for pain (Hand pain).   fluocinonide (LIDEX) 0.05 % external solution Apply 1 application topically 2 (two) times daily as needed for itching or pain.   gabapentin (NEURONTIN) 300 MG capsule Take 300 mg by mouth 2 (two) times daily as needed. Leg and nerve pain   glucose blood test strip TRUE Metrix Blood Glucose Test Strip, use as instructed   Homeopathic Products (THERAWORX RELIEF) FOAM Apply 1 application topically 2 (two) times daily as needed (Pain).   hydrALAZINE (APRESOLINE) 50 MG tablet Take 1 tablet (50 mg total) by mouth 2 (two) times daily.   Lancets MISC 1 each by Does not apply route 2 (two) times daily. TRUE matrix lancets   levothyroxine (SYNTHROID) 75 MCG tablet TAKE 2 TABS BY MOUTH ONCE DAILY 6 DAYS A WEEK AND 1 TAB BY MOUTH ONCE DAILY ONE DAY A WEEK (Patient taking differently:  150 mcg. TAKE 1 TABS BY MOUTH ONCE DAILY 6 DAYS A WEEK AND 1 TAB BY MOUTH ONCE DAILY ONE DAY A WEEK)   losartan (COZAAR) 100 MG tablet TAKE 1 TABLET EVERY DAY   Magnesium 500 MG TABS Take by mouth. OTC   meclizine (ANTIVERT) 25 MG tablet Take 1 tablet (25 mg total) by mouth 3 (three) times daily as needed for dizziness.   mirabegron ER (MYRBETRIQ) 25 MG TB24 tablet Take 25 mg by mouth daily.   multivitamin (THERAGRAN) per tablet Take 1 tablet by mouth daily.   nitroGLYCERIN (NITROSTAT) 0.4 MG SL tablet Place 1 tablet (0.4 mg total) under the tongue every 5 (five) minutes as needed for chest pain.   polyethylene glycol (MIRALAX / GLYCOLAX) 17 g packet Take 17-34 g by mouth daily as needed.   rosuvastatin (CRESTOR) 5 MG tablet Take 5 mg by mouth daily.   sodium chloride (OCEAN) 0.65 % nasal  spray Place 1 spray into both nostrils at bedtime as needed for congestion.   No current facility-administered medications for this visit. (Other)      REVIEW OF SYSTEMS:    ALLERGIES Allergies  Allergen Reactions   Pravastatin Other (See Comments)    Muscle cramps   Sildenafil Other (See Comments)    Tachycardia     Oxycodone     hallucinations   Atenolol Other (See Comments)    bradycardia   Elastic Bandages & [Zinc] Other (See Comments)    Teflon bandages - Irritation   Metoclopramide Hcl Anxiety    PAST MEDICAL HISTORY Past Medical History:  Diagnosis Date   Allergic rhinitis 02/02/2016   Anemia 12/17/2009   Formatting of this note might be different from the original. 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 difficile colitis 04/11/2021   Cancer ([place])    skin cancer   Carotid stenosis    a. Carotid U/S 1/30: LICA < 86%, RICA 57-84%;  b.  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   Cystoid macular edema of both eyes 07/28/2020   Degenerative disc disease, lumbar 06/30/2015   Deviated nasal septum 02/18/2015   Diverticulosis 03/03/2020   DJD (degenerative joint disease) of hip 12/24/2012   Dyspnea 06/12/2011   Emphysema, unspecified ([place]) 03/05/2020   Erectile dysfunction  06/20/2017   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[place]) 02/09/2021   Gait disorder 03/02/2021   GERD (gastroesophageal reflux disease)    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   Qualifier: Diagnosis of  By: Burnett Kanaris     Greater trochanteric pain syndrome 07/22/2021   Hearing loss 02/02/2016   Heme positive stool 02/29/2020   HIATAL HERNIA 05/13/2009   Qualifier: Diagnosis of  By: Burnett Kanaris     High risk medication use 01/18/2012  Hip pain 03/02/2021   History of chicken pox    History of hemorrhoids    History of kidney stones    History of total bilateral knee replacement 07/08/2020   Hyperglycemia 03/03/2014   Hyperlipidemia    under control   Hyperlipidemia with target LDL less than 130 12/27/2010   Formatting of this note might be different from the original. Cardiology - Dr Frances Nickels at [facility] 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 Osteoarthritis Of The Hand  10/1 IMO update   Osteoarthrosis, unspecified whether generalized or localized, pelvic region and thigh 07/25/2010   Formatting of this note might be different from the original. Osteoarthritis Of The Hip   Other abnormal glucose 07/22/2021   Other allergic rhinitis 02/18/2015   Other ill-defined and unknown causes of morbidity and mortality 09/12/1999   Nov 20, 2000 Entered By: Alycia Patten Comment: ltFeb 23, 2006  Entered By: Alycia Patten Comment: rt 11/05   Peripheral neuropathy 06/19/2016   PONV (postoperative nausea and vomiting)    Preventative health care 11/24/2015   Right groin pain 11/29/2012   Right inguinal hernia 05/13/2009   Qualifier: Diagnosis of  By: Burnett Kanaris     RLS (restless legs syndrome) 02/18/2015   Rosacea    Rotator cuff tear 07/27/2013   Situational depression 03/02/2021   Sleep apnea with use of continuous positive airway pressure (CPAP) 07/15/2013   CPAP set to  7 cm water,  Residual AHi was 7.8  With no leak.  User time 6 hours.  479 days , not used only 12 days - highly compliant 06-23-13  .    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   Urinary retention 11/26/2020   Ventral hernia 07/08/2012   Vertigo 07/08/2021   Weight loss 03/03/2020   Past Surgical History:  Procedure Laterality Date   ABDOMINAL HERNIA REPAIR  07/15/12   Dr [name]   ANTERIOR CERVICAL DECOMP/DISCECTOMY FUSION N/A 07/09/2020   Procedure: ANTERIOR CERVICAL DECOMPRESSION AND FUSION CERVICAL THREE-FOUR.;  Surgeon: [name], MD;  Location: [facility];  Service: Neurosurgery;  Laterality: N/A;  anterior   BROW LIFT  05/07/01   CYSTOSCOPY WITH URETEROSCOPY AND STENT PLACEMENT Right 01/28/2014   Procedure: CYSTOSCOPY WITH URETEROSCOPY, BASKET RETRIVAL AND  STENT PLACEMENT;  Surgeon: [name], MD;  Location: [facility];  Service: Urology;  Laterality: Right;   epidural injections     multiple procedures   ESOPHAGEAL DILATION  1993 and 1994   multiple times   EYE SURGERY Bilateral 03/25/10, 2012   cataract removal   HEMORRHOID SURGERY  2014   HIATAL HERNIA REPAIR  12/21/93  HOLMIUM LASER APPLICATION Right  2/37/6283   Procedure: HOLMIUM LASER APPLICATION;  Surgeon: [name], MD;  Location: [facility];  Service: Urology;  Laterality: Right;   INGUINAL HERNIA REPAIR Right 07/15/12   Dr [name], x2   JOINT REPLACEMENT  07/25/04   right knee   JOINT REPLACEMENT  07/13/10   left knee   [place]  09/20/06   with intraoperative cholangiogram and right inguinal herniorrhaphy with mesh    LIGAMENT REPAIR Left 07/2013   shoulder   LITHOTRIPSY     x2   [place] FUSION/FORAMINOTOMY N/A 11/24/2020   Procedure: Posterior Cervical Laminectomy Cervical three-four, Cervical four-five with lateral mass fusion;  Surgeon: [name], MD;  Location: [facility];  Service: Neurosurgery;  Laterality: N/A;   REFRACTIVE SURGERY Bilateral    Right total hip replacement  4/14   SKIN CANCER DESTRUCTION     nose, ear   TONSILLECTOMY  as child   TOTAL HIP ARTHROPLASTY Left    URETHRAL DILATION  1991   [name]    FAMILY HISTORY Family History  Problem Relation Age of Onset   Cancer Mother    Hypertension Other    Cancer Other    Osteoarthritis Other     SOCIAL HISTORY Social History   Tobacco Use   Smoking status: Former    Years: 11.00    Types: Cigarettes    Quit date: 09/11/1978    Years since quitting: 43.1   Smokeless tobacco: Never  Vaping Use   Vaping Use: Never used  Substance Use Topics   Alcohol use: Not Currently    Alcohol/week: 0.0 standard drinks    Comment: rare   Drug use: No         OPHTHALMIC EXAM:  Base Eye Exam     Visual Acuity (ETDRS)       Right Left   Dist [place] 20/60 -1 20/40 -2   Dist ph [place] NI NI         Tonometry (Tonopen, 9:20 AM)       Right Left   Pressure 12 10         Pupils       Pupils Dark Light React APD   Right PERRL 2 2 Minimal None   Left PERRL 2 2 Minimal None         Extraocular Movement       Right Left    Full Full         Neuro/Psych      Oriented x3: Yes   Mood/Affect: Normal         Dilation     Right eye: 1.0% Mydriacyl, 2.5% Phenylephrine @ 9:20 AM           Slit Lamp and Fundus Exam     External Exam       Right Left   External Normal Normal         Slit Lamp Exam       Right Left   Lids/Lashes Normal Normal   Conjunctiva/Sclera White and quiet White and quiet   Cornea Clear Clear   Anterior Chamber Deep and quiet Deep and quiet   [place] PI PI   Lens Clear Clear   Anterior Vitreous Normal Normal         Fundus Exam       Right Left   Posterior Vitreous Posterior vitreous detachment  Disc Normal    C/D Ratio 0.5    Macula No overt CME, angulated vessels temporally, with thickening along the p.m. bundle.  Much less    Vessels Normal    Periphery Normal             IMAGING AND PROCEDURES  Imaging and Procedures for 10/13/21  Intravitreal Injection, Pharmacologic Agent - OD - Right Eye       Time Out 10/13/2021. 9:41 AM. Confirmed correct patient, procedure, site, and patient consented.   Anesthesia Topical anesthesia was used. Anesthetic medications included Lidocaine 4%.   Procedure Preparation included Tobramycin 0.3%, 10% betadine to eyelids, 5% betadine to ocular surface. A 30 gauge needle was used.   Injection: 2.5 mg bevacizumab 2.5 MG/0.1ML   Route: Intravitreal, Site: Right Eye   NDC: [phone]   Post-op Post injection exam found visual acuity of at least counting fingers, no retinal detachment, perfused optic nerve. The patient tolerated the procedure well. There were no complications. The patient received written and verbal post procedure care education. Post injection medications included ocuflox.      OCT, Retina - OU - Both Eyes       Right Eye Quality was good. Scan locations included subfoveal. Central Foveal Thickness: 262. Progression has worsened. Findings include abnormal foveal contour, cystoid macular edema.   Left Eye Quality was good. Scan  locations included subfoveal. Central Foveal Thickness: 262. Progression has been stable. Findings include abnormal foveal contour, cystoid macular edema.   Notes Chronic active CME OD and with peripapillary CNVM and thickening now stable 6 weeks post intravitreal Avastin OD.  We will repeat to control the peripapillary CNVM.  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HCC)  H35.3211 Intravitreal Injection, Pharmacologic Agent - OD - Right Eye    OCT, Retina - OU - Both Eyes    bevacizumab (AVASTIN) SOSY 2.5 mg    2. Sleep apnea with use of continuous positive airway pressure (CPAP)  G47.30     3. 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  Repeat today  2.  OU with chronic severe MAC-TEL, with poor mask compliance with a leakages, needs improvement in mask compliance was to maximize improvement MAC-TEL progression  3.  Ophthalmic Meds Ordered this visit:  Meds ordered this encounter  Medications   bevacizumab (AVASTIN) SOSY 2.5 mg       Return in about 6 weeks (around 11/24/2021) for DILATE OU, AVASTIN OCT, OD.  There are no Patient Instructions on file for this visit.   Explained the diagnoses, plan, and follow up with the patient and they expressed understanding.  Patient expressed understanding of the  importance of proper follow up care.   Clent Demark Itay Mella M.D. Diseases & Surgery of the Retina and Vitreous Retina [facility] Diabetic [place] 10/13/21  Abbreviations: M myopia (nearsighted); A astigmatism; H hyperopia (farsighted); P presbyopia; Mrx spectacle prescription;  CTL contact lenses; OD right eye; OS left eye; OU both eyes  XT exotropia; ET esotropia; PEK punctate epithelial keratitis; PEE punctate epithelial erosions; DES dry eye syndrome; MGD meibomian gland dysfunction; ATs artificial tears; PFAT's preservative free artificial tears; [place] nuclear sclerotic cataract; PSC posterior subcapsular cataract; ERM epi-retinal membrane; PVD posterior vitreous detachment; RD retinal detachment; DM diabetes mellitus; DR diabetic retinopathy; NPDR non-proliferative diabetic retinopathy; PDR proliferative diabetic retinopathy; CSME clinically significant macular edema; DME diabetic macular edema; dbh dot blot hemorrhages; CWS cotton wool spot; POAG primary open angle glaucoma; C/D cup-to-disc ratio; HVF humphrey visual field; GVF goldmann visual field; OCT optical coherence tomography; IOP intraocular pressure; BRVO Branch retinal vein occlusion; CRVO central retinal vein occlusion; CRAO central retinal artery occlusion; BRAO branch retinal artery occlusion; RT retinal tear; SB scleral buckle; PPV pars plana vitrectomy; VH Vitreous hemorrhage; PRP panretinal laser photocoagulation; IVK intravitreal kenalog; VMT vitreomacular traction; MH Macular hole;  NVD neovascularization of the disc; NVE neovascularization elsewhere; AREDS age related eye disease study; ARMD age related macular degeneration; POAG primary open angle glaucoma; EBMD epithelial/anterior basement membrane dystrophy; ACIOL anterior chamber intraocular lens; IOL intraocular lens; PCIOL posterior chamber intraocular lens; Phaco/IOL phacoemulsification with intraocular lens placement; [place] photorefractive keratectomy; LASIK laser assisted in situ  keratomileusis; HTN hypertension; DM diabetes mellitus; COPD chronic obstructive pulmonary disease

## 2021-10-13 NOTE — Assessment & Plan Note (Signed)
Poor compliance due to mask leaking

## 2021-10-13 NOTE — Telephone Encounter (Signed)
Pt would like a call from the nurse to discuss pressure on CPAP machine. Pressure is too high, wake me up at night; has been going on for month.

## 2021-10-13 NOTE — Therapy (Signed)
[place], [place], [address] Phone: [phone]   Fax:  [phone]  Physical Therapy Treatment  Patient Details  Name: [name] Pratt MRN: [id number] Date of Birth: 1941/01/05 Referring Provider (PT): [name], NP   Encounter Date: 10/13/2021   PT End of Session - 10/13/21 1403     Visit Number 2    Number of Visits 16    Date for PT Re-Evaluation 11/30/21    Authorization Type [name]    Progress Note Due on Visit 10    PT Start Time 1402    PT Stop Time 1445    PT Time Calculation (min) 43 min    Activity Tolerance Patient tolerated treatment well    Behavior During Therapy [facility] for tasks assessed/performed             Past Medical History:  Diagnosis Date   Allergic rhinitis 02/02/2016   Anemia 12/17/2009   Formatting of this note might be different from the original. Anemia  10/1 IMO update   Aortic atherosclerosis ([place]) 03/05/2020   Arthritis    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   Atypical chest pain 11/24/2015   Benign essential hypertension 05/13/2009   Qualifier: Diagnosis of  By: Larwance Sachs of this note might be different from the original. Hypertension   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[place]) 07/22/2021   Body mass index (BMI) 25.0-25.9, adult 11/02/2020   C. difficile colitis 04/11/2021   Cancer ([place])    skin cancer   Carotid stenosis    a. Carotid U/S 1/51: LICA < 76%, RICA 16-07%;  b.  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   Cystoid macular edema of both eyes 07/28/2020   Degenerative disc disease, lumbar 06/30/2015    Deviated nasal septum 02/18/2015   Diverticulosis 03/03/2020   DJD (degenerative joint disease) of hip 12/24/2012   Dyspnea 06/12/2011   Emphysema, unspecified ([place]) 03/05/2020   Erectile dysfunction 06/20/2017   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[place]) 02/09/2021   Gait disorder 03/02/2021   GERD (gastroesophageal reflux disease)    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   Qualifier: Diagnosis of  By: Burnett Kanaris     Greater trochanteric pain syndrome 07/22/2021   Hearing loss 02/02/2016   Heme positive stool 02/29/2020   HIATAL HERNIA 05/13/2009   Qualifier: Diagnosis of  By: Burnett Kanaris     High risk medication use 01/18/2012   Hip pain 03/02/2021   History of chicken pox    History of hemorrhoids    History of kidney stones    History of total bilateral knee replacement 07/08/2020   Hyperglycemia 03/03/2014   Hyperlipidemia    under control   Hyperlipidemia with target LDL less than 130 12/27/2010   Formatting of this note might be different from the original. Cardiology - Dr Frances Nickels at [facility] cardiology  ICD-10 cut over   Hypertension    under control   Hypokalemia 07/08/2021   Hypothyroidism    Ileus ([place])    Internal hemorrhoids 03/03/2020   Leg cramps 11/13/2019   Lower GI bleed 12/12/2012   Lumbago  of lumbar region with sciatica 10/21/2020   Lumbar radiculopathy 03/02/2021   Lumbar spondylosis 11/02/2020   Nasal congestion 04/11/2021   NEPHROLITHIASIS 05/13/2009   Qualifier: Diagnosis of  By: Burnett Kanaris     Nonspecific abnormal electrocardiogram (ECG) (EKG) 01/06/2013   Onychomycosis 06/30/2015   Osteoarthrosis, 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 Osteoarthritis Of The Hand  10/1 IMO update   Osteoarthrosis, unspecified whether generalized or localized, pelvic region and thigh 07/25/2010   Formatting of this note might be different from the original. Osteoarthritis Of The Hip   Other  abnormal glucose 07/22/2021   Other allergic rhinitis 02/18/2015   Other ill-defined and unknown causes of morbidity and mortality 09/12/1999   Nov 20, 2000 Entered By: Alycia Patten Comment: ltFeb 23, 2006 Entered By: Alycia Patten Comment: rt 11/05   Peripheral neuropathy 06/19/2016   PONV (postoperative nausea and vomiting)    Preventative health care 11/24/2015   Right groin pain 11/29/2012   Right inguinal hernia 05/13/2009   Qualifier: Diagnosis of  By: Burnett Kanaris     RLS (restless legs syndrome) 02/18/2015   Rosacea    Rotator cuff tear 07/27/2013   Situational depression 03/02/2021   Sleep apnea with use of continuous positive airway pressure (CPAP) 07/15/2013   CPAP set to  7 cm water,  Residual AHi was 7.8  With no leak.  User time 6 hours.  479 days , not used only 12 days - highly compliant 06-23-13  .    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   Urinary retention 11/26/2020   Ventral hernia 07/08/2012   Vertigo 07/08/2021   Weight loss 03/03/2020    Past Surgical History:  Procedure Laterality Date   ABDOMINAL HERNIA REPAIR  07/15/12   Dr [name]   ANTERIOR CERVICAL DECOMP/DISCECTOMY FUSION N/A 07/09/2020   Procedure: ANTERIOR CERVICAL DECOMPRESSION AND FUSION CERVICAL THREE-FOUR.;  Surgeon: [name], MD;  Location: [facility];  Service: Neurosurgery;  Laterality: N/A;  anterior   BROW LIFT  05/07/01   CYSTOSCOPY WITH URETEROSCOPY AND STENT PLACEMENT Right 01/28/2014   Procedure: CYSTOSCOPY WITH URETEROSCOPY, BASKET RETRIVAL AND  STENT PLACEMENT;  Surgeon: [name], MD;  Location: [facility];  Service: Urology;  Laterality: Right;   epidural injections     multiple procedures    ESOPHAGEAL DILATION  1993 and 1994   multiple times   EYE SURGERY Bilateral 03/25/10, 2012   cataract removal   HEMORRHOID SURGERY  2014   HIATAL HERNIA REPAIR  12/21/93   HOLMIUM LASER APPLICATION Right 5/68/1275   Procedure: HOLMIUM LASER APPLICATION;  Surgeon: [name], MD;  Location: [facility];  Service: Urology;  Laterality: Right;   INGUINAL HERNIA REPAIR Right 07/15/12   Dr [name], x2   JOINT REPLACEMENT  07/25/04   right knee   JOINT REPLACEMENT  07/13/10   left knee   [place]  09/20/06   with intraoperative cholangiogram and right inguinal herniorrhaphy with mesh    LIGAMENT REPAIR Left 07/2013   shoulder   LITHOTRIPSY     x2   [place] FUSION/FORAMINOTOMY N/A 11/24/2020  Procedure: Posterior Cervical Laminectomy Cervical three-four, Cervical four-five with lateral mass fusion;  Surgeon: [name], MD;  Location: [facility];  Service: Neurosurgery;  Laterality: N/A;   REFRACTIVE SURGERY Bilateral    Right total hip replacement  4/14   SKIN CANCER DESTRUCTION     nose, ear   TONSILLECTOMY  as child   TOTAL HIP ARTHROPLASTY Left    URETHRAL DILATION  1991   [name]    There were no vitals filed for this visit.   Subjective Assessment - 10/13/21 1404     Subjective Pt reports heart continues to do okay. Pt does note some dizziness this morning when he rolled over.    Pertinent History spondylitic myelopathy, ACDF, HTN, COPD, cervical decompression, sleep apnea    Patient Stated Goals "get a lot better."    Currently in Pain? No/denies                [facility] PT Assessment - 10/13/21 0001       Assessment   Medical Diagnosis R26.9 (ICD-10-CM) - Gait disorder  R42 (ICD-10-CM) - Vertigo    Referring Provider (PT) [name], NP    Onset Date/Surgical Date 08/27/21    Hand Dominance Right      Berg Balance Test   Sit to Stand Able to stand without using hands and stabilize  independently    Standing Unsupported Able to stand safely 2 minutes    Sitting with Back Unsupported but Feet Supported on Floor or Stool Able to sit safely and securely 2 minutes    Stand to Sit Sits safely with minimal use of hands    Transfers Able to transfer safely, minor use of hands    Standing Unsupported with Eyes Closed Able to stand 10 seconds safely    Standing Unsupported with Feet Together Able to place feet together independently and stand for 1 minute with supervision    From Standing, Reach Forward with Outstretched Arm Can reach forward >12 cm safely (5")    From Standing Position, Pick up Object from Floor Able to pick up shoe, needs supervision    From Standing Position, Turn to Look Behind Over each Shoulder Turn sideways only but maintains balance   limited due to neck and lumbar stiffness/ROM   Turn 360 Degrees Able to turn 360 degrees safely but slowly    Standing Unsupported, Alternately Place Feet on Step/Stool Able to complete >2 steps/needs minimal assist    Standing Unsupported, One Foot in Front Able to plae foot ahead of the other independently and hold 30 seconds    Standing on One Leg Tries to lift leg/unable to hold 3 seconds but remains standing independently    Total Score 42    Berg comment: 42/56                 Vestibular Assessment - 10/13/21 0001       Positional Testing   Horizontal Canal Testing Horizontal Canal Right;Horizontal Canal Left      Dix-Hallpike Right   Dix-Hallpike Right Duration ~20 sec initially, 2nd rep 15 sec    Dix-Hallpike Right Symptoms Upbeat, right rotatory nystagmus      Dix-Hallpike Left   Dix-Hallpike Left Duration 0    Dix-Hallpike Left Symptoms No nystagmus      Horizontal Canal Right   Horizontal Canal Right Duration 2 sec    Horizontal Canal Right Symptoms --   Unable to visualize; pt reports symptoms     Horizontal Canal  Left   Horizontal Canal Left Duration 0    Horizontal Canal Left Symptoms  Normal                       Vestibular Treatment/Exercise - 10/13/21 0001       Vestibular Treatment/Exercise   Canalith Repositioning Epley Manuever Right;Appiani Right       EPLEY MANUEVER RIGHT   Number of Reps  3    Overall Response Improved Symptoms      Appiani Right   Number of Reps  2    Overall Response  Improved Symptoms                      PT Short Term Goals - 10/05/21 1049       PT SHORT TERM GOAL #1   Title Pt. will complete Berg.    Time 2    Period Weeks    Status New    Target Date 10/19/21               PT Long Term Goals - 10/05/21 1046       PT LONG TERM GOAL #1   Title The patient will be indep with progressed HEP for gait and balance.    Time 8    Period Weeks    Status New    Target Date 11/30/21      PT LONG TERM GOAL #2   Title The patient will improve Berg balance scale to 45/56 to decrease risk of falls.    Baseline not assessed today    Time 8    Period Weeks    Status New    Target Date 11/30/21      PT LONG TERM GOAL #3   Title Patient will demonstrate improved functional strength by completeing 5x STS in < 16 seconds.    Baseline 10/05/21- 21 seconds.    Time 8    Period Weeks    Status New    Target Date 11/30/21      PT LONG TERM GOAL #4   Title Pt will maintain TUG at 14.5 seconds to demo maintained mobility    Baseline 10/05/21- 18.75 seconds without cane    Time 8    Period Weeks    Status New    Target Date 11/30/21      PT LONG TERM GOAL #5   Title Pt will demo no s/s of BPPV in all positions of canalith testing    Time 8    Period Weeks    Status New    Target Date 11/30/21                    Patient will benefit from skilled therapeutic intervention in order to improve the following deficits and impairments:     Visit Diagnosis: Dizziness and giddiness  Muscle weakness (generalized)  Unsteadiness on feet  Other abnormalities of gait and mobility  BPPV  (benign paroxysmal positional vertigo), right  Other symptoms and signs involving the nervous system     Problem List Patient Active Problem List   Diagnosis Date Noted   Benign paroxysmal positional vertigo 09/14/2021   Palpitations 07/25/2021   Cardiac murmur 07/25/2021   History of chicken pox 07/22/2021   History of hemorrhoids 07/22/2021   Hypertension 07/22/2021   COPD (chronic obstructive pulmonary disease) ([place]) 07/22/2021   History of kidney stones 07/22/2021   Cancer ([place]) 42/70/6237   Complication of  anesthesia 07/22/2021   PONV (postoperative nausea and vomiting) 07/22/2021   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HCC) 07/22/2021   Other abnormal glucose 07/22/2021   Spondylitis ([place]) 07/22/2021   Greater trochanteric pain syndrome 07/22/2021   Vertigo 07/08/2021   Hypokalemia 07/08/2021   Trochanteric bursitis of left hip 06/30/2021   C. 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[place]) 02/09/2021   Urinary retention 11/26/2020   Cervical myelopathy (HCC) 11/24/2020   Body mass index (BMI) 25.0-25.9, adult 11/02/2020   Lumbar spondylosis 11/02/2020   Lumbago of lumbar region with sciatica 10/21/2020   Cystoid macular edema of both eyes 07/28/2020   Status post cervical spinal fusion 07/09/2020   History of total bilateral knee replacement 07/08/2020   Aortic atherosclerosis ([place]) 03/05/2020   Emphysema, unspecified ([place]) 03/05/2020   Arthritis 03/04/2020   Diverticulosis 03/03/2020   Internal hemorrhoids 03/03/2020   Weight loss 03/03/2020   Heme positive stool 02/29/2020   Leg cramps 11/13/2019   Tibialis anterior tendon tear, nontraumatic 11/13/2019   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   CPAP (continuous positive airway pressure) dependence  03/25/2018   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   Erectile dysfunction 06/20/2017   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 03/06/2017   Peripheral neuropathy 06/19/2016   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   Hearing loss 02/02/2016   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   Allergic rhinitis 02/02/2016   Atypical chest pain 11/24/2015   Preventative health care 11/24/2015   Onychomycosis 06/30/2015   Degenerative disc disease, lumbar 06/30/2015   Other allergic rhinitis 02/18/2015   Deviated nasal septum 02/18/2015   RLS (restless legs syndrome) 02/18/2015   GERD (gastroesophageal reflux disease) 09/18/2014   Hyperglycemia 03/03/2014   Rotator cuff tear 07/27/2013   Sleep apnea with use of continuous positive airway pressure (CPAP) 07/15/2013   Carotid stenosis 02/24/2013   Nonspecific abnormal electrocardiogram (ECG) (EKG) 01/06/2013   DJD (degenerative joint disease) of hip 12/24/2012   Lower GI bleed 12/12/2012   Right groin pain 11/29/2012   Ventral hernia 07/08/2012   High risk medication use 01/18/2012   Dyspnea 06/12/2011   Hyperlipidemia with target LDL less than 130 12/27/2010   Osteoarthrosis, unspecified whether generalized or localized, pelvic region and thigh 07/25/2010   Osteoarthrosis, hand 06/13/2010   Anemia 12/17/2009   Hypothyroidism 05/13/2009   Hyperlipidemia 05/13/2009   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   Benign essential hypertension 05/13/2009   Right inguinal hernia 05/13/2009   HIATAL HERNIA 05/13/2009   NEPHROLITHIASIS 05/13/2009   ROSACEA 05/13/2009   Other ill-defined and unknown causes of morbidity and mortality 09/12/1999    [facility] April Gordy Levan, PT, DPT 10/13/2021, 2:49 PM  [facility] [place], [place], [address] Phone: [phone]   Fax:  [phone]  Name: NEMIAH KISSNER MRN: [id number] Date of Birth: 1941/06/22

## 2021-10-13 NOTE — Assessment & Plan Note (Signed)
OD, improved peripapillary CNVM, on antivegF will repeat injection today at 6 weeks to maintain

## 2021-10-13 NOTE — Assessment & Plan Note (Signed)
Chronic active OU, might be some progression due to poor mask requirements with leaking around

## 2021-10-14 DIAGNOSIS — N1832 Chronic kidney disease, stage 3b: Secondary | ICD-10-CM | POA: Diagnosis not present

## 2021-10-14 DIAGNOSIS — N390 Urinary tract infection, site not specified: Secondary | ICD-10-CM | POA: Diagnosis not present

## 2021-10-17 ENCOUNTER — Encounter: Payer: Self-pay | Admitting: Physical Therapy

## 2021-10-17 ENCOUNTER — Other Ambulatory Visit: Payer: Self-pay

## 2021-10-17 ENCOUNTER — Ambulatory Visit: Payer: Medicare HMO | Admitting: Physical Therapy

## 2021-10-17 DIAGNOSIS — M6281 Muscle weakness (generalized): Secondary | ICD-10-CM

## 2021-10-17 DIAGNOSIS — H8111 Benign paroxysmal vertigo, right ear: Secondary | ICD-10-CM | POA: Diagnosis not present

## 2021-10-17 DIAGNOSIS — R2681 Unsteadiness on feet: Secondary | ICD-10-CM

## 2021-10-17 DIAGNOSIS — R42 Dizziness and giddiness: Secondary | ICD-10-CM | POA: Diagnosis not present

## 2021-10-17 DIAGNOSIS — R2689 Other abnormalities of gait and mobility: Secondary | ICD-10-CM

## 2021-10-17 DIAGNOSIS — R29818 Other symptoms and signs involving the nervous system: Secondary | ICD-10-CM | POA: Diagnosis not present

## 2021-10-17 NOTE — Therapy (Signed)
[place], [place], [address] Phone: [phone]   Fax:  [phone]  Physical Therapy Treatment  Patient Details  Name: LOUIS IVERY MRN: [id number] Date of Birth: 19-Aug-1941 Referring Provider (PT): [name], NP   Encounter Date: 10/17/2021   PT End of Session - 10/17/21 1014     Visit Number 3    Number of Visits 16    Date for PT Re-Evaluation 11/30/21    Authorization Type [name]    Progress Note Due on Visit 10    PT Start Time 0935    PT Stop Time 1015    PT Time Calculation (min) 40 min    Activity Tolerance Patient tolerated treatment well    Behavior During Therapy [facility] for tasks assessed/performed             Past Medical History:  Diagnosis Date   Allergic rhinitis 02/02/2016   Anemia 12/17/2009   Formatting of this note might be different from the original. Anemia  10/1 IMO update   Aortic atherosclerosis ([place]) 03/05/2020   Arthritis    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   Atypical chest pain 11/24/2015   Benign essential hypertension 05/13/2009   Qualifier: Diagnosis of  By: Larwance Sachs of this note might be different from the original. Hypertension   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[place]) 07/22/2021   Body mass index (BMI) 25.0-25.9, adult 11/02/2020   C. difficile colitis 04/11/2021   Cancer ([place])    skin cancer   Carotid stenosis    a. Carotid U/S 1/70: LICA < 01%, RICA 74-94%;  b.  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   Cystoid macular edema of both eyes 07/28/2020   Degenerative disc disease, lumbar 06/30/2015    Deviated nasal septum 02/18/2015   Diverticulosis 03/03/2020   DJD (degenerative joint disease) of hip 12/24/2012   Dyspnea 06/12/2011   Emphysema, unspecified ([place]) 03/05/2020   Erectile dysfunction 06/20/2017   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[place]) 02/09/2021   Gait disorder 03/02/2021   GERD (gastroesophageal reflux disease)    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   Qualifier: Diagnosis of  By: Burnett Kanaris     Greater trochanteric pain syndrome 07/22/2021   Hearing loss 02/02/2016   Heme positive stool 02/29/2020   HIATAL HERNIA 05/13/2009   Qualifier: Diagnosis of  By: Burnett Kanaris     High risk medication use 01/18/2012   Hip pain 03/02/2021   History of chicken pox    History of hemorrhoids    History of kidney stones    History of total bilateral knee replacement 07/08/2020   Hyperglycemia 03/03/2014   Hyperlipidemia    under control   Hyperlipidemia with target LDL less than 130 12/27/2010   Formatting of this note might be different from the original. Cardiology - Dr Frances Nickels at [facility] cardiology  ICD-10 cut over   Hypertension    under control   Hypokalemia 07/08/2021   Hypothyroidism    Ileus ([place])    Internal hemorrhoids 03/03/2020   Leg cramps 11/13/2019   Lower GI bleed 12/12/2012   Lumbago  of lumbar region with sciatica 10/21/2020   Lumbar radiculopathy 03/02/2021   Lumbar spondylosis 11/02/2020   Nasal congestion 04/11/2021   NEPHROLITHIASIS 05/13/2009   Qualifier: Diagnosis of  By: Burnett Kanaris     Nonspecific abnormal electrocardiogram (ECG) (EKG) 01/06/2013   Onychomycosis 06/30/2015   Osteoarthrosis, 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 Osteoarthritis Of The Hand  10/1 IMO update   Osteoarthrosis, unspecified whether generalized or localized, pelvic region and thigh 07/25/2010   Formatting of this note might be different from the original. Osteoarthritis Of The Hip   Other  abnormal glucose 07/22/2021   Other allergic rhinitis 02/18/2015   Other ill-defined and unknown causes of morbidity and mortality 09/12/1999   Nov 20, 2000 Entered By: Alycia Patten Comment: ltFeb 23, 2006 Entered By: Alycia Patten Comment: rt 11/05   Peripheral neuropathy 06/19/2016   PONV (postoperative nausea and vomiting)    Preventative health care 11/24/2015   Right groin pain 11/29/2012   Right inguinal hernia 05/13/2009   Qualifier: Diagnosis of  By: Burnett Kanaris     RLS (restless legs syndrome) 02/18/2015   Rosacea    Rotator cuff tear 07/27/2013   Situational depression 03/02/2021   Sleep apnea with use of continuous positive airway pressure (CPAP) 07/15/2013   CPAP set to  7 cm water,  Residual AHi was 7.8  With no leak.  User time 6 hours.  479 days , not used only 12 days - highly compliant 06-23-13  .    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   Urinary retention 11/26/2020   Ventral hernia 07/08/2012   Vertigo 07/08/2021   Weight loss 03/03/2020    Past Surgical History:  Procedure Laterality Date   ABDOMINAL HERNIA REPAIR  07/15/12   Dr [name]   ANTERIOR CERVICAL DECOMP/DISCECTOMY FUSION N/A 07/09/2020   Procedure: ANTERIOR CERVICAL DECOMPRESSION AND FUSION CERVICAL THREE-FOUR.;  Surgeon: [name], MD;  Location: [place];  Service: Neurosurgery;  Laterality: N/A;  anterior   BROW LIFT  05/07/01   CYSTOSCOPY WITH URETEROSCOPY AND STENT PLACEMENT Right 01/28/2014   Procedure: CYSTOSCOPY WITH URETEROSCOPY, BASKET RETRIVAL AND  STENT PLACEMENT;  Surgeon: [name], MD;  Location: [facility];  Service: Urology;  Laterality: Right;   epidural injections     multiple procedures    ESOPHAGEAL DILATION  1993 and 1994   multiple times   EYE SURGERY Bilateral 03/25/10, 2012   cataract removal   HEMORRHOID SURGERY  2014   HIATAL HERNIA REPAIR  12/21/93   HOLMIUM LASER APPLICATION Right 12/18/8117   Procedure: HOLMIUM LASER APPLICATION;  Surgeon: [name], MD;  Location: [facility];  Service: Urology;  Laterality: Right;   INGUINAL HERNIA REPAIR Right 07/15/12   Dr [name], x2   JOINT REPLACEMENT  07/25/04   right knee   JOINT REPLACEMENT  07/13/10   left knee   [place]  09/20/06   with intraoperative cholangiogram and right inguinal herniorrhaphy with mesh    LIGAMENT REPAIR Left 07/2013   shoulder   LITHOTRIPSY     x2   [place] FUSION/FORAMINOTOMY N/A 11/24/2020  Procedure: Posterior Cervical Laminectomy Cervical three-four, Cervical four-five with lateral mass fusion;  Surgeon: [name], MD;  Location: [place];  Service: Neurosurgery;  Laterality: N/A;   REFRACTIVE SURGERY Bilateral    Right total hip replacement  4/14   SKIN CANCER DESTRUCTION     nose, ear   TONSILLECTOMY  as child   TOTAL HIP ARTHROPLASTY Left    URETHRAL DILATION  1991   [name]    There were no vitals filed for this visit.   Subjective Assessment - 10/17/21 0941     Subjective Pt reports his RLE remains weak and there's a small pain in his Rt buttocks.  No dizziness like he had before.  He complains of dizziness when he first gets up in the morning, but not with rollingn bed.    Pertinent History spondylitic myelopathy, ACDF, HTN, COPD, cervical decompression, sleep apnea    Patient Stated Goals "get a lot better."    Currently in Pain? Yes    Pain Score 1     Pain Location Buttocks    Pain Orientation Right    Pain Descriptors / Indicators Sore                [facility] PT Assessment - 10/17/21 0001       Assessment   Medical Diagnosis R26.9 (ICD-10-CM) - Gait disorder  R42 (ICD-10-CM) -  Vertigo    Referring Provider (PT) [name], NP    Onset Date/Surgical Date 08/27/21    Hand Dominance Right               [facility] Adult PT Treatment/Exercise - 10/17/21 0001       Knee/Hip Exercises: Stretches   Passive Hamstring Stretch Right;Left;2 reps;20 seconds   seated, hip hing   Piriformis Stretch Right;Left;1 rep;20 seconds    Gastroc Stretch Right;Left;2 reps;30 seconds      Knee/Hip Exercises: Standing   Heel Raises Both;10 reps   UE on counter.     Knee/Hip Exercises: Seated   Sit to Sand 1 set;10 reps;without UE support             Balance Exercises - 10/17/21 0001       Balance Exercises: Standing   SLS Eyes open;Foam/compliant surface;Upper extremity support 1;1 rep;10 secs    Stepping Strategy Lateral;Foam/compliant surface;5 reps   UE support on counter   Retro Gait 2 reps    Sidestepping 2 reps    Marching Foam/compliant surface;Upper extremity assist 1;Static;10 reps   min A with SLS on LLE   Other Standing Exercises staggered stance with weight shifts to front/back leg with cone reaching to shoulder height shelf x 8 reps each, min A; repeated side to side weight shift with cross body reaching with cone x 8 each direction CGA/min A to steady.                  PT Short Term Goals - 10/05/21 1049       PT SHORT TERM GOAL #1   Title Pt. will complete Berg.    Time 2    Period Weeks    Status New    Target Date 10/19/21               PT Long Term Goals - 10/05/21 1046       PT LONG TERM GOAL #1   Title The patient will be indep with progressed HEP for gait and balance.    Time 8  Period Weeks    Status New    Target Date 11/30/21      PT LONG TERM GOAL #2   Title The patient will improve Berg balance scale to 45/56 to decrease risk of falls.    Baseline not assessed today    Time 8    Period Weeks    Status New    Target Date 11/30/21      PT LONG TERM GOAL #3   Title Patient will demonstrate improved  functional strength by completeing 5x STS in < 16 seconds.    Baseline 10/05/21- 21 seconds.    Time 8    Period Weeks    Status New    Target Date 11/30/21      PT LONG TERM GOAL #4   Title Pt will maintain TUG at 14.5 seconds to demo maintained mobility    Baseline 10/05/21- 18.75 seconds without cane    Time 8    Period Weeks    Status New    Target Date 11/30/21      PT LONG TERM GOAL #5   Title Pt will demo no s/s of BPPV in all positions of canalith testing    Time 8    Period Weeks    Status New    Target Date 11/30/21                   Plan - 10/17/21 0955     Clinical Impression Statement Pt requires UE support for Lt stance exercises due to decreased balance and strength. He required min A for weight shifts ant/post for reaching exercises.    Personal Factors and Comorbidities Comorbidity 3+;Time since onset of injury/illness/exacerbation    Comorbidities HTN, COPD, spondylitis myelopathy, falls    Examination-Activity Limitations Squat;Stairs;Locomotion Level;Bed Mobility;Bend    Examination-Participation Restrictions [employer] Activity    Stability/Clinical Decision Making Evolving/Moderate complexity    Rehab Potential Good    PT Frequency 2x / week    PT Duration 8 weeks    PT Treatment/Interventions ADLs/Self Care Home Management;Patient/family education;Balance training;Neuromuscular re-education;Gait training;Therapeutic activities;Therapeutic exercise;Functional mobility training;Taping;Manual techniques;Vestibular;Canalith Repostioning    PT Home Exercise Plan [email or web address] (from previous episode)    Consulted and Agree with Plan of Care Patient             Patient will benefit from skilled therapeutic intervention in order to improve the following deficits and impairments:  Pain, Decreased activity tolerance, Decreased balance, Decreased strength, Decreased range of motion, Impaired flexibility, Impaired tone, Increased fascial  restricitons, Dizziness, Abnormal gait, Increased edema  Visit Diagnosis: Muscle weakness (generalized)  Unsteadiness on feet  Other abnormalities of gait and mobility     Problem List Patient Active Problem List   Diagnosis Date Noted   Benign paroxysmal positional vertigo 09/14/2021   Palpitations 07/25/2021   Cardiac murmur 07/25/2021   History of chicken pox 07/22/2021   History of hemorrhoids 07/22/2021   Hypertension 07/22/2021   COPD (chronic obstructive pulmonary disease) ([place]) 07/22/2021   History of kidney stones 07/22/2021   Cancer ([place]) 56/86/1683   Complication of anesthesia 07/22/2021   PONV (postoperative nausea and vomiting) 07/22/2021   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[place]) 07/22/2021   Other abnormal glucose 07/22/2021   Spondylitis ([place]) 07/22/2021   Greater trochanteric pain syndrome 07/22/2021   Vertigo 07/08/2021   Hypokalemia 07/08/2021   Trochanteric bursitis of left hip 06/30/2021   C. difficile colitis 04/11/2021   Lumbar radiculopathy 03/02/2021   Gait disorder 03/02/2021   Hip  pain 03/02/2021   Situational depression 03/02/2021   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[place]) 02/09/2021   Urinary retention 11/26/2020   Cervical myelopathy (HCC) 11/24/2020   Body mass index (BMI) 25.0-25.9, adult 11/02/2020   Lumbar spondylosis 11/02/2020   Lumbago of lumbar region with sciatica 10/21/2020   Cystoid macular edema of both eyes 07/28/2020   Status post cervical spinal fusion 07/09/2020   History of total bilateral knee replacement 07/08/2020   Aortic atherosclerosis ([place]) 03/05/2020   Emphysema, unspecified ([place]) 03/05/2020   Arthritis 03/04/2020   Diverticulosis 03/03/2020   Internal hemorrhoids 03/03/2020   Weight loss 03/03/2020   Heme positive stool 02/29/2020   Leg cramps 11/13/2019   Tibialis anterior tendon tear, nontraumatic 11/13/2019   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  07/29/2018   CPAP (continuous positive airway pressure) dependence 03/25/2018   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   Erectile dysfunction 06/20/2017   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 03/06/2017   Peripheral neuropathy 06/19/2016   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   Hearing loss 02/02/2016   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   Allergic rhinitis 02/02/2016   Atypical chest pain 11/24/2015   Preventative health care 11/24/2015   Onychomycosis 06/30/2015   Degenerative disc disease, lumbar 06/30/2015   Other allergic rhinitis 02/18/2015   Deviated nasal septum 02/18/2015   RLS (restless legs syndrome) 02/18/2015   GERD (gastroesophageal reflux disease) 09/18/2014   Hyperglycemia 03/03/2014   Rotator cuff tear 07/27/2013   Sleep apnea with use of continuous positive airway pressure (CPAP) 07/15/2013   Carotid stenosis 02/24/2013   Nonspecific abnormal electrocardiogram (ECG) (EKG) 01/06/2013   DJD (degenerative joint disease) of hip 12/24/2012   Lower GI bleed 12/12/2012   Right groin pain 11/29/2012   Ventral hernia 07/08/2012   High risk medication use 01/18/2012   Dyspnea 06/12/2011   Hyperlipidemia with target LDL less than 130 12/27/2010   Osteoarthrosis, unspecified whether generalized or localized, pelvic region and thigh 07/25/2010   Osteoarthrosis, hand 06/13/2010   Anemia 12/17/2009   Hypothyroidism 05/13/2009   Hyperlipidemia 05/13/2009   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   Benign essential hypertension 05/13/2009   Right inguinal hernia 05/13/2009   HIATAL HERNIA 05/13/2009   NEPHROLITHIASIS 05/13/2009   ROSACEA 05/13/2009   Other ill-defined and unknown causes of morbidity and mortality 09/12/1999   Kerin Perna, PTA 10/17/21 10:19 AM  [facility] [place] [address] [place], [place], [address] Phone: [phone]   Fax:  [phone]  Name: GEORGES VICTORIO MRN: [id number] Date of Birth: 1941-07-26

## 2021-10-18 NOTE — Telephone Encounter (Signed)
I called pt and relayed MM/NP message that even with decreased pressure his AHI has remained high.  MM/NP recommendation to do cpap titration to see what is pressure is right for him.  Or keep pressure at 8/14 realizing that his AHI will be higher then the recommended <5.  Pt is ok to proceed with cpap titration study.  This will be ordered and they will authorize then call to set up appt.  He has appt with DME about mask refit.  I relayed that is good., keep that.  He states the mask pressure is too high.  Verbalized understanding of plan.

## 2021-10-18 NOTE — Telephone Encounter (Signed)
Please advise patient that we can turn down the pressure but his AHI has remained high. He most likely needs a CPAP titration to try to get the right pressure settings for him. We can offer a CPAP titration or we can takes his pressure back to 8-14 as long as he understand that his apnea is not <5

## 2021-10-19 ENCOUNTER — Other Ambulatory Visit: Payer: Self-pay | Admitting: Family

## 2021-10-19 DIAGNOSIS — Z85828 Personal history of other malignant neoplasm of skin: Secondary | ICD-10-CM | POA: Diagnosis not present

## 2021-10-19 DIAGNOSIS — L570 Actinic keratosis: Secondary | ICD-10-CM | POA: Diagnosis not present

## 2021-10-19 DIAGNOSIS — D485 Neoplasm of uncertain behavior of skin: Secondary | ICD-10-CM | POA: Diagnosis not present

## 2021-10-19 DIAGNOSIS — C44319 Basal cell carcinoma of skin of other parts of face: Secondary | ICD-10-CM | POA: Diagnosis not present

## 2021-10-20 ENCOUNTER — Other Ambulatory Visit: Payer: Self-pay

## 2021-10-20 ENCOUNTER — Other Ambulatory Visit: Payer: Self-pay | Admitting: Family

## 2021-10-20 ENCOUNTER — Ambulatory Visit: Payer: Medicare HMO | Admitting: Physical Therapy

## 2021-10-20 DIAGNOSIS — R42 Dizziness and giddiness: Secondary | ICD-10-CM

## 2021-10-20 DIAGNOSIS — M6281 Muscle weakness (generalized): Secondary | ICD-10-CM | POA: Diagnosis not present

## 2021-10-20 DIAGNOSIS — R2689 Other abnormalities of gait and mobility: Secondary | ICD-10-CM | POA: Diagnosis not present

## 2021-10-20 DIAGNOSIS — H8111 Benign paroxysmal vertigo, right ear: Secondary | ICD-10-CM | POA: Diagnosis not present

## 2021-10-20 DIAGNOSIS — R29818 Other symptoms and signs involving the nervous system: Secondary | ICD-10-CM | POA: Diagnosis not present

## 2021-10-20 DIAGNOSIS — R2681 Unsteadiness on feet: Secondary | ICD-10-CM

## 2021-10-20 NOTE — Therapy (Signed)
[facility] [place], [place], [address] Phone: [phone]   Fax:  [phone]  Physical Therapy Treatment  Patient Details  Name: [name] Pratt MRN: [id number] Date of Birth: June 08, 1941 Referring Provider (PT): [name], NP   Encounter Date: 10/20/2021   PT End of Session - 10/20/21 1155     Visit Number 4    Number of Visits 16    Date for PT Re-Evaluation 11/30/21    Authorization Type [name]    Progress Note Due on Visit 10    PT Start Time 1150    PT Stop Time 1230    PT Time Calculation (min) 40 min    Activity Tolerance Patient tolerated treatment well    Behavior During Therapy [facility] for tasks assessed/performed             Past Medical History:  Diagnosis Date   Allergic rhinitis 02/02/2016   Anemia 12/17/2009   Formatting of this note might be different from the original. Anemia  10/1 IMO update   Aortic atherosclerosis ([place]) 03/05/2020   Arthritis    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   Atypical chest pain 11/24/2015   Benign essential hypertension 05/13/2009   Qualifier: Diagnosis of  By: Larwance Sachs of this note might be different from the original. Hypertension   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[place]) 07/22/2021   Body mass index (BMI) 25.0-25.9, adult 11/02/2020   C. difficile colitis 04/11/2021   Cancer ([place])    skin cancer   Carotid stenosis    a. Carotid U/S 3/76: LICA < 28%, RICA 31-51%;  b.  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   Cystoid macular edema of both eyes 07/28/2020   Degenerative disc disease, lumbar 06/30/2015    Deviated nasal septum 02/18/2015   Diverticulosis 03/03/2020   DJD (degenerative joint disease) of hip 12/24/2012   Dyspnea 06/12/2011   Emphysema, unspecified ([place]) 03/05/2020   Erectile dysfunction 06/20/2017   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[place]) 02/09/2021   Gait disorder 03/02/2021   GERD (gastroesophageal reflux disease)    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   Qualifier: Diagnosis of  By: Burnett Kanaris     Greater trochanteric pain syndrome 07/22/2021   Hearing loss 02/02/2016   Heme positive stool 02/29/2020   HIATAL HERNIA 05/13/2009   Qualifier: Diagnosis of  By: Burnett Kanaris     High risk medication use 01/18/2012   Hip pain 03/02/2021   History of chicken pox    History of hemorrhoids    History of kidney stones    History of total bilateral knee replacement 07/08/2020   Hyperglycemia 03/03/2014   Hyperlipidemia    under control   Hyperlipidemia with target LDL less than 130 12/27/2010   Formatting of this note might be different from the original. Cardiology - Dr Frances Nickels at [facility] cardiology  ICD-10 cut over   Hypertension    under control   Hypokalemia 07/08/2021   Hypothyroidism    Ileus ([place])    Internal hemorrhoids 03/03/2020   Leg cramps 11/13/2019   Lower GI bleed 12/12/2012   Lumbago  of lumbar region with sciatica 10/21/2020   Lumbar radiculopathy 03/02/2021   Lumbar spondylosis 11/02/2020   Nasal congestion 04/11/2021   NEPHROLITHIASIS 05/13/2009   Qualifier: Diagnosis of  By: Burnett Kanaris     Nonspecific abnormal electrocardiogram (ECG) (EKG) 01/06/2013   Onychomycosis 06/30/2015   Osteoarthrosis, 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 Osteoarthritis Of The Hand  10/1 IMO update   Osteoarthrosis, unspecified whether generalized or localized, pelvic region and thigh 07/25/2010   Formatting of this note might be different from the original. Osteoarthritis Of The Hip   Other  abnormal glucose 07/22/2021   Other allergic rhinitis 02/18/2015   Other ill-defined and unknown causes of morbidity and mortality 09/12/1999   Nov 20, 2000 Entered By: Alycia Patten Comment: ltFeb 23, 2006 Entered By: Alycia Patten Comment: rt 11/05   Peripheral neuropathy 06/19/2016   PONV (postoperative nausea and vomiting)    Preventative health care 11/24/2015   Right groin pain 11/29/2012   Right inguinal hernia 05/13/2009   Qualifier: Diagnosis of  By: Burnett Kanaris     RLS (restless legs syndrome) 02/18/2015   Rosacea    Rotator cuff tear 07/27/2013   Situational depression 03/02/2021   Sleep apnea with use of continuous positive airway pressure (CPAP) 07/15/2013   CPAP set to  7 cm water,  Residual AHi was 7.8  With no leak.  User time 6 hours.  479 days , not used only 12 days - highly compliant 06-23-13  .    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   Urinary retention 11/26/2020   Ventral hernia 07/08/2012   Vertigo 07/08/2021   Weight loss 03/03/2020    Past Surgical History:  Procedure Laterality Date   ABDOMINAL HERNIA REPAIR  07/15/12   Dr [name]   ANTERIOR CERVICAL DECOMP/DISCECTOMY FUSION N/A 07/09/2020   Procedure: ANTERIOR CERVICAL DECOMPRESSION AND FUSION CERVICAL THREE-FOUR.;  Surgeon: [name], MD;  Location: [facility];  Service: Neurosurgery;  Laterality: N/A;  anterior   BROW LIFT  05/07/01   CYSTOSCOPY WITH URETEROSCOPY AND STENT PLACEMENT Right 01/28/2014   Procedure: CYSTOSCOPY WITH URETEROSCOPY, BASKET RETRIVAL AND  STENT PLACEMENT;  Surgeon: [name], MD;  Location: [facility];  Service: Urology;  Laterality: Right;   epidural injections     multiple procedures    ESOPHAGEAL DILATION  1993 and 1994   multiple times   EYE SURGERY Bilateral 03/25/10, 2012   cataract removal   HEMORRHOID SURGERY  2014   HIATAL HERNIA REPAIR  12/21/93   HOLMIUM LASER APPLICATION Right 8/34/1962   Procedure: HOLMIUM LASER APPLICATION;  Surgeon: [name], MD;  Location: [facility];  Service: Urology;  Laterality: Right;   INGUINAL HERNIA REPAIR Right 07/15/12   Dr [name], x2   JOINT REPLACEMENT  07/25/04   right knee   JOINT REPLACEMENT  07/13/10   left knee   [place]  09/20/06   with intraoperative cholangiogram and right inguinal herniorrhaphy with mesh    LIGAMENT REPAIR Left 07/2013   shoulder   LITHOTRIPSY     x2   [place] FUSION/FORAMINOTOMY N/A 11/24/2020  Procedure: Posterior Cervical Laminectomy Cervical three-four, Cervical four-five with lateral mass fusion;  Surgeon: [name], MD;  Location: [facility];  Service: Neurosurgery;  Laterality: N/A;   REFRACTIVE SURGERY Bilateral    Right total hip replacement  4/14   SKIN CANCER DESTRUCTION     nose, ear   TONSILLECTOMY  as child   TOTAL HIP ARTHROPLASTY Left    URETHRAL DILATION  1991   [name]    There were no vitals filed for this visit.   Subjective Assessment - 10/20/21 1155     Subjective No issues with dizziness. Just continued balance issues -- especially with bending forward.    Pertinent History spondylitic myelopathy, ACDF, HTN, COPD, cervical decompression, sleep apnea    Patient Stated Goals "get a lot better."    Currently in Pain? No/denies                [facility] PT Assessment - 10/20/21 0001       Assessment   Medical Diagnosis R26.9 (ICD-10-CM) - Gait disorder  R42 (ICD-10-CM) - Vertigo    Referring Provider (PT) [name], NP    Onset Date/Surgical Date 08/27/21    Hand Dominance Right                           [facility] Adult PT Treatment/Exercise - 10/20/21 0001        Knee/Hip Exercises: Stretches   Other Knee/Hip Stretches standing open/close book at counter      Knee/Hip Exercises: Aerobic   Nustep L5 x 6 min UEs/LEs      Knee/Hip Exercises: Standing   Heel Raises Both;20 reps    Heel Raises Limitations on foam    Other Standing Knee Exercises on foam: weighted red ball chest to forward x10; diagonals x10 each side                 Balance Exercises - 10/20/21 0001       Balance Exercises: Standing   Standing Eyes Opened Narrow base of support (BOS);Foam/compliant surface;30 secs    Standing Eyes Opened Limitations Alternating UE flexion 2x10 each; feet together forward flexion 2x10    Stepping Strategy Lateral;Foam/compliant surface;10 reps    Gait with Head Turns Forward;2 reps   using SPC: head nods x 2 laps around gym, head nods x2 laps around gym; min A for minor LOB   Sidestepping 3 reps    Other Standing Exercises side step over 3 pool noodles x 4 reps    Other Standing Exercises Comments Forward and backwards step over dowel x10 each leg                  PT Short Term Goals - 10/20/21 1157       PT SHORT TERM GOAL #1   Title Pt. will complete Berg.    Baseline 42/56    Time 2    Period Weeks    Status Achieved    Target Date 10/19/21               PT Long Term Goals - 10/20/21 1240       PT LONG TERM GOAL #1   Title The patient will be indep with progressed HEP for gait and balance.    Time 8    Period Weeks    Status On-going    Target Date 11/30/21      PT LONG TERM GOAL #2   Title  The patient will improve Berg balance scale to 45/56 to decrease risk of falls.    Baseline 42/56    Time 8    Period Weeks    Status On-going    Target Date 11/30/21      PT LONG TERM GOAL #3   Title Patient will demonstrate improved functional strength by completeing 5x STS in < 16 seconds.    Baseline 10/05/21- 21 seconds.    Time 8    Period Weeks    Status On-going    Target Date 11/30/21       PT LONG TERM GOAL #4   Title Pt will maintain TUG at 14.5 seconds to demo maintained mobility    Baseline 10/05/21- 18.75 seconds without cane    Time 8    Period Weeks    Status On-going    Target Date 11/30/21      PT LONG TERM GOAL #5   Title Pt will demo no s/s of BPPV in all positions of canalith testing    Time 8    Period Weeks    Status Achieved    Target Date 11/30/21                   Plan - 10/20/21 1157     Clinical Impression Statement Treatment focused on continuing to improve pt's strength and balance. Worked on stepping strategies and dynamic balance.    Personal Factors and Comorbidities Comorbidity 3+;Time since onset of injury/illness/exacerbation    Comorbidities HTN, COPD, spondylitis myelopathy, falls    Examination-Activity Limitations Squat;Stairs;Locomotion Level;Bed Mobility;Bend    Examination-Participation Restrictions [employer] Activity    Stability/Clinical Decision Making Evolving/Moderate complexity    Rehab Potential Good    PT Frequency 2x / week    PT Duration 8 weeks    PT Treatment/Interventions ADLs/Self Care Home Management;Patient/family education;Balance training;Neuromuscular re-education;Gait training;Therapeutic activities;Therapeutic exercise;Functional mobility training;Taping;Manual techniques;Vestibular;Canalith Repostioning    PT Home Exercise Plan [email or web address] (from previous episode)    Consulted and Agree with Plan of Care Patient             Patient will benefit from skilled therapeutic intervention in order to improve the following deficits and impairments:  Pain, Decreased activity tolerance, Decreased balance, Decreased strength, Decreased range of motion, Impaired flexibility, Impaired tone, Increased fascial restricitons, Dizziness, Abnormal gait, Increased edema  Visit Diagnosis: Muscle weakness (generalized)  Unsteadiness on feet  Other abnormalities of gait and mobility  Dizziness and  giddiness  BPPV (benign paroxysmal positional vertigo), right     Problem List Patient Active Problem List   Diagnosis Date Noted   Benign paroxysmal positional vertigo 09/14/2021   Palpitations 07/25/2021   Cardiac murmur 07/25/2021   History of chicken pox 07/22/2021   History of hemorrhoids 07/22/2021   Hypertension 07/22/2021   COPD (chronic obstructive pulmonary disease) ([place]) 07/22/2021   History of kidney stones 07/22/2021   Cancer ([place]) 94/49/6759   Complication of anesthesia 07/22/2021   PONV (postoperative nausea and vomiting) 07/22/2021   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[place]) 07/22/2021   Other abnormal glucose 07/22/2021   Spondylitis ([place]) 07/22/2021   Greater trochanteric pain syndrome 07/22/2021   Vertigo 07/08/2021   Hypokalemia 07/08/2021   Trochanteric bursitis of left hip 06/30/2021   C. 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[place]) 02/09/2021   Urinary  retention 11/26/2020   Cervical myelopathy ([place]) 11/24/2020   Body mass index (BMI) 25.0-25.9, adult 11/02/2020   Lumbar spondylosis 11/02/2020   Lumbago of lumbar region with sciatica 10/21/2020   Cystoid macular edema of both eyes 07/28/2020   Status post cervical spinal fusion 07/09/2020   History of total bilateral knee replacement 07/08/2020   Aortic atherosclerosis ([place]) 03/05/2020   Emphysema, unspecified ([place]) 03/05/2020   Arthritis 03/04/2020   Diverticulosis 03/03/2020   Internal hemorrhoids 03/03/2020   Weight loss 03/03/2020   Heme positive stool 02/29/2020   Leg cramps 11/13/2019   Tibialis anterior tendon tear, nontraumatic 11/13/2019   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   CPAP (continuous positive airway pressure) dependence 03/25/2018   Complex sleep apnea syndrome  03/25/2018   Erectile dysfunction 06/20/2017   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 03/06/2017   Peripheral neuropathy 06/19/2016   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   Hearing loss 02/02/2016   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   Allergic rhinitis 02/02/2016   Atypical chest pain 11/24/2015   Preventative health care 11/24/2015   Onychomycosis 06/30/2015   Degenerative disc disease, lumbar 06/30/2015   Other allergic rhinitis 02/18/2015   Deviated nasal septum 02/18/2015   RLS (restless legs syndrome) 02/18/2015   GERD (gastroesophageal reflux disease) 09/18/2014   Hyperglycemia 03/03/2014   Rotator cuff tear 07/27/2013   Sleep apnea with use of continuous positive airway pressure (CPAP) 07/15/2013   Carotid stenosis 02/24/2013   Nonspecific abnormal electrocardiogram (ECG) (EKG) 01/06/2013   DJD (degenerative joint disease) of hip 12/24/2012   Lower GI bleed 12/12/2012   Right groin pain 11/29/2012   Ventral hernia 07/08/2012   High risk medication use 01/18/2012   Dyspnea 06/12/2011   Hyperlipidemia with target LDL less than 130 12/27/2010   Osteoarthrosis, unspecified whether generalized or localized, pelvic region and thigh 07/25/2010   Osteoarthrosis, hand 06/13/2010   Anemia 12/17/2009   Hypothyroidism 05/13/2009   Hyperlipidemia 05/13/2009   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   Benign essential hypertension 05/13/2009   Right inguinal hernia 05/13/2009   HIATAL HERNIA 05/13/2009   NEPHROLITHIASIS 05/13/2009   ROSACEA 05/13/2009   Other ill-defined and unknown causes of morbidity and mortality 09/12/1999    [facility] April Gordy Levan, PT, DPT 10/20/2021, 12:44 PM  [facility] [name], [place], [address] Phone: [phone]   Fax:  [phone]  Name: [name] Pratt MRN: [id number] Date of Birth: 10-Apr-1941

## 2021-10-21 ENCOUNTER — Other Ambulatory Visit: Payer: Self-pay | Admitting: *Deleted

## 2021-10-21 MED ORDER — LEVOTHYROXINE SODIUM 75 MCG PO TABS: ORAL_TABLET | ORAL | 1 refills | Status: DC

## 2021-10-24 ENCOUNTER — Ambulatory Visit: Payer: Medicare HMO | Admitting: Physical Therapy

## 2021-10-24 ENCOUNTER — Other Ambulatory Visit: Payer: Self-pay

## 2021-10-24 ENCOUNTER — Encounter: Payer: Self-pay | Admitting: Physical Therapy

## 2021-10-24 DIAGNOSIS — R2681 Unsteadiness on feet: Secondary | ICD-10-CM

## 2021-10-24 DIAGNOSIS — R2689 Other abnormalities of gait and mobility: Secondary | ICD-10-CM

## 2021-10-24 DIAGNOSIS — R42 Dizziness and giddiness: Secondary | ICD-10-CM | POA: Diagnosis not present

## 2021-10-24 DIAGNOSIS — M6281 Muscle weakness (generalized): Secondary | ICD-10-CM

## 2021-10-24 DIAGNOSIS — R29818 Other symptoms and signs involving the nervous system: Secondary | ICD-10-CM | POA: Diagnosis not present

## 2021-10-24 DIAGNOSIS — H8111 Benign paroxysmal vertigo, right ear: Secondary | ICD-10-CM | POA: Diagnosis not present

## 2021-10-24 NOTE — Therapy (Signed)
[place], [place], [address] Phone: [phone]   Fax:  [phone]  Physical Therapy Treatment  Patient Details  Name: [name] Pratt MRN: [id number] Date of Birth: 09-Apr-1941 Referring Provider (PT): [name], NP   Encounter Date: 10/24/2021   PT End of Session - 10/24/21 0930     Visit Number 5    Number of Visits 16    Date for PT Re-Evaluation 11/30/21    Authorization Type [name]    Authorization - Visit Number 5    Progress Note Due on Visit 10    PT Start Time 0932    PT Stop Time 1012    PT Time Calculation (min) 40 min    Activity Tolerance Patient tolerated treatment well    Behavior During Therapy [facility] for tasks assessed/performed             Past Medical History:  Diagnosis Date   Allergic rhinitis 02/02/2016   Anemia 12/17/2009   Formatting of this note might be different from the original. Anemia  10/1 IMO update   Aortic atherosclerosis ([place]) 03/05/2020   Arthritis    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   Atypical chest pain 11/24/2015   Benign essential hypertension 05/13/2009   Qualifier: Diagnosis of  By: Larwance Sachs of this note might be different from the original. Hypertension   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[place]) 07/22/2021   Body mass index (BMI) 25.0-25.9, adult 11/02/2020   C. difficile colitis 04/11/2021   Cancer ([place])    skin cancer   Carotid stenosis    a. Carotid U/S 5/37: LICA < 48%, RICA 27-07%;  b.  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   Cystoid macular edema of both eyes 07/28/2020    Degenerative disc disease, lumbar 06/30/2015   Deviated nasal septum 02/18/2015   Diverticulosis 03/03/2020   DJD (degenerative joint disease) of hip 12/24/2012   Dyspnea 06/12/2011   Emphysema, unspecified ([place]) 03/05/2020   Erectile dysfunction 06/20/2017   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[place]) 02/09/2021   Gait disorder 03/02/2021   GERD (gastroesophageal reflux disease)    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   Qualifier: Diagnosis of  By: Burnett Kanaris     Greater trochanteric pain syndrome 07/22/2021   Hearing loss 02/02/2016   Heme positive stool 02/29/2020   HIATAL HERNIA 05/13/2009   Qualifier: Diagnosis of  By: Burnett Kanaris     High risk medication use 01/18/2012   Hip pain 03/02/2021   History of chicken pox    History of hemorrhoids    History of kidney stones    History of total bilateral knee replacement 07/08/2020   Hyperglycemia 03/03/2014   Hyperlipidemia    under control   Hyperlipidemia with target LDL less than 130 12/27/2010   Formatting of this note might be different from the original. Cardiology - Dr Frances Nickels at [facility] cardiology  ICD-10 cut over   Hypertension    under control   Hypokalemia 07/08/2021   Hypothyroidism    Ileus ([place])    Internal hemorrhoids 03/03/2020   Leg cramps 11/13/2019  Lower GI bleed 12/12/2012   Lumbago of lumbar region with sciatica 10/21/2020   Lumbar radiculopathy 03/02/2021   Lumbar spondylosis 11/02/2020   Nasal congestion 04/11/2021   NEPHROLITHIASIS 05/13/2009   Qualifier: Diagnosis of  By: Burnett Kanaris     Nonspecific abnormal electrocardiogram (ECG) (EKG) 01/06/2013   Onychomycosis 06/30/2015   Osteoarthrosis, 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 Osteoarthritis Of The Hand  10/1 IMO update   Osteoarthrosis, unspecified whether generalized or localized, pelvic region and thigh 07/25/2010   Formatting of this note might be different from the  original. Osteoarthritis Of The Hip   Other abnormal glucose 07/22/2021   Other allergic rhinitis 02/18/2015   Other ill-defined and unknown causes of morbidity and mortality 09/12/1999   Nov 20, 2000 Entered By: Alycia Patten Comment: ltFeb 23, 2006 Entered By: Alycia Patten Comment: rt 11/05   Peripheral neuropathy 06/19/2016   PONV (postoperative nausea and vomiting)    Preventative health care 11/24/2015   Right groin pain 11/29/2012   Right inguinal hernia 05/13/2009   Qualifier: Diagnosis of  By: Burnett Kanaris     RLS (restless legs syndrome) 02/18/2015   Rosacea    Rotator cuff tear 07/27/2013   Situational depression 03/02/2021   Sleep apnea with use of continuous positive airway pressure (CPAP) 07/15/2013   CPAP set to  7 cm water,  Residual AHi was 7.8  With no leak.  User time 6 hours.  479 days , not used only 12 days - highly compliant 06-23-13  .    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   Urinary retention 11/26/2020   Ventral hernia 07/08/2012   Vertigo 07/08/2021   Weight loss 03/03/2020    Past Surgical History:  Procedure Laterality Date   ABDOMINAL HERNIA REPAIR  07/15/12   Dr [name]   ANTERIOR CERVICAL DECOMP/DISCECTOMY FUSION N/A 07/09/2020   Procedure: ANTERIOR CERVICAL DECOMPRESSION AND FUSION CERVICAL THREE-FOUR.;  Surgeon: [name], MD;  Location: [facility];  Service: Neurosurgery;  Laterality: N/A;  anterior   BROW LIFT  05/07/01   CYSTOSCOPY WITH URETEROSCOPY AND STENT PLACEMENT Right 01/28/2014   Procedure: CYSTOSCOPY WITH URETEROSCOPY, BASKET RETRIVAL AND  STENT PLACEMENT;  Surgeon: [name], MD;  Location: [facility];  Service: Urology;  Laterality: Right;   epidural  injections     multiple procedures   ESOPHAGEAL DILATION  1993 and 1994   multiple times   EYE SURGERY Bilateral 03/25/10, 2012   cataract removal   HEMORRHOID SURGERY  2014   HIATAL HERNIA REPAIR  12/21/93   HOLMIUM LASER APPLICATION Right 12/11/270   Procedure: HOLMIUM LASER APPLICATION;  Surgeon: [name], MD;  Location: [facility];  Service: Urology;  Laterality: Right;   INGUINAL HERNIA REPAIR Right 07/15/12   Dr [name], x2   JOINT REPLACEMENT  07/25/04   right knee   JOINT REPLACEMENT  07/13/10   left knee   [place]  09/20/06   with intraoperative cholangiogram and right inguinal herniorrhaphy with mesh    LIGAMENT REPAIR Left 07/2013   shoulder   LITHOTRIPSY     x2   [place]  POSTERIOR CERVICAL FUSION/FORAMINOTOMY N/A 11/24/2020   Procedure: Posterior Cervical Laminectomy Cervical three-four, Cervical four-five with lateral mass fusion;  Surgeon: [name], MD;  Location: [facility];  Service: Neurosurgery;  Laterality: N/A;   REFRACTIVE SURGERY Bilateral    Right total hip replacement  4/14   SKIN CANCER DESTRUCTION     nose, ear   TONSILLECTOMY  as child   TOTAL HIP ARTHROPLASTY Left    URETHRAL DILATION  1991   [name]    There were no vitals filed for this visit.   Subjective Assessment - 10/24/21 1009     Subjective Pt reports if he starts to turn, ie: to close the door behind you, he notices his Rt foot will get hung up on his left.  He states he has think about lifting his RLE, "I gotta train my brain".    Pertinent History spondylitic myelopathy, ACDF, HTN, COPD, cervical decompression, sleep apnea    Patient Stated Goals "get a lot better."    Currently in Pain? No/denies    Pain Score 0-No pain                [facility] PT Assessment - 10/24/21 0001       Assessment   Medical Diagnosis R26.9 (ICD-10-CM) - Gait disorder  R42 (ICD-10-CM) - Vertigo    Referring Provider (PT) [name],  NP    Onset Date/Surgical Date 08/27/21    Hand Dominance Right      Transfers   Five time sit to stand comments  17.9 seconds      Timed Up and Go Test   Normal TUG (seconds) 14.6    TUG Comments without AD, SBA for safety               [facility] Adult PT Treatment/Exercise - 10/24/21 0001       Knee/Hip Exercises: Stretches   Passive Hamstring Stretch Right;Left;2 reps;20 seconds   seated, hip hinge   Gastroc Stretch Right;Left;2 reps;10 seconds      Knee/Hip Exercises: Aerobic   Nustep pt declined              Balance Exercises - 10/24/21 0001       Balance Exercises: Standing   Standing Eyes Opened Narrow base of support (BOS);Foam/compliant surface;30 secs    Standing Eyes Opened Limitations also, holding weighted ball ( 2.2#) in/out from core, and wood chop x 10 each direction (standing on foam)    Standing Eyes Closed Wide (BOA);Foam/compliant surface;2 reps;10 secs    SLS Eyes open;Intermittent upper extremity support;1 rep;10 secs    Stepping Strategy Lateral;10 reps   stepping over big green noodle   Gait with Head Turns Forward;2 reps   no cane, CGA for safety.   head nods x 1 lap around gym, horiz head turns x 1 lap around gym   Partial Tandem Stance Eyes open;1 rep;30 secs    Retro Gait 2 reps    Sidestepping 3 reps    Marching Intermittent upper extremity assist;Foam/compliant surface;Dynamic   3" foam                 PT Short Term Goals - 10/20/21 1157       PT SHORT TERM GOAL #1   Title Pt. will complete Berg.    Baseline 42/56    Time 2    Period Weeks    Status Achieved    Target Date 10/19/21  PT Long Term Goals - 10/20/21 1240       PT LONG TERM GOAL #1   Title The patient will be indep with progressed HEP for gait and balance.    Time 8    Period Weeks    Status On-going    Target Date 11/30/21      PT LONG TERM GOAL #2   Title The patient will improve Berg balance scale to 45/56 to decrease risk of  falls.    Baseline 42/56    Time 8    Period Weeks    Status On-going    Target Date 11/30/21      PT LONG TERM GOAL #3   Title Patient will demonstrate improved functional strength by completeing 5x STS in < 16 seconds.    Baseline 10/05/21- 21 seconds.    Time 8    Period Weeks    Status On-going    Target Date 11/30/21      PT LONG TERM GOAL #4   Title Pt will maintain TUG at 14.5 seconds to demo maintained mobility    Baseline 10/05/21- 18.75 seconds without cane    Time 8    Period Weeks    Status On-going    Target Date 11/30/21      PT LONG TERM GOAL #5   Title Pt will demo no s/s of BPPV in all positions of canalith testing    Time 8    Period Weeks    Status Achieved    Target Date 11/30/21                   Plan - 10/24/21 0949     Clinical Impression Statement Improved TUG and 5x STS.  Pt had difficulty with Lt SLS and Rt lateral stepping strategy. Session focused on balance and gait; required CGA and intermittent UE on counter to steady.  Progressing towards LTGs.    Personal Factors and Comorbidities Comorbidity 3+;Time since onset of injury/illness/exacerbation    Comorbidities HTN, COPD, spondylitis myelopathy, falls    Examination-Activity Limitations Squat;Stairs;Locomotion Level;Bed Mobility;Bend    Examination-Participation Restrictions [employer] Activity    Stability/Clinical Decision Making Evolving/Moderate complexity    Rehab Potential Good    PT Frequency 2x / week    PT Duration 8 weeks    PT Treatment/Interventions ADLs/Self Care Home Management;Patient/family education;Balance training;Neuromuscular re-education;Gait training;Therapeutic activities;Therapeutic exercise;Functional mobility training;Taping;Manual techniques;Vestibular;Canalith Repostioning    PT Next Visit Plan Berg activities; gait; balance.    PT Home Exercise Plan [id number] (from previous episode)    Consulted and Agree with Plan of Care Patient              Patient will benefit from skilled therapeutic intervention in order to improve the following deficits and impairments:  Pain, Decreased activity tolerance, Decreased balance, Decreased strength, Decreased range of motion, Impaired flexibility, Impaired tone, Increased fascial restricitons, Dizziness, Abnormal gait, Increased edema  Visit Diagnosis: Muscle weakness (generalized)  Unsteadiness on feet  Other abnormalities of gait and mobility     Problem List Patient Active Problem List   Diagnosis Date Noted   Benign paroxysmal positional vertigo 09/14/2021   Palpitations 07/25/2021   Cardiac murmur 07/25/2021   History of chicken pox 07/22/2021   History of hemorrhoids 07/22/2021   Hypertension 07/22/2021   COPD (chronic obstructive pulmonary disease) ([place]) 07/22/2021   History of kidney stones 07/22/2021   Cancer ([place]) 32/95/1884   Complication of anesthesia 07/22/2021   PONV (postoperative nausea and  vomiting) 07/22/2021   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HCC) 07/22/2021   Other abnormal glucose 07/22/2021   Spondylitis ([place]) 07/22/2021   Greater trochanteric pain syndrome 07/22/2021   Vertigo 07/08/2021   Hypokalemia 07/08/2021   Trochanteric bursitis of left hip 06/30/2021   C. 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[place]) 02/09/2021   Urinary retention 11/26/2020   Cervical myelopathy (HCC) 11/24/2020   Body mass index (BMI) 25.0-25.9, adult 11/02/2020   Lumbar spondylosis 11/02/2020   Lumbago of lumbar region with sciatica 10/21/2020   Cystoid macular edema of both eyes 07/28/2020   Status post cervical spinal fusion 07/09/2020   History of total bilateral knee replacement 07/08/2020   Aortic atherosclerosis ([place]) 03/05/2020   Emphysema, unspecified ([place])  03/05/2020   Arthritis 03/04/2020   Diverticulosis 03/03/2020   Internal hemorrhoids 03/03/2020   Weight loss 03/03/2020   Heme positive stool 02/29/2020   Leg cramps 11/13/2019   Tibialis anterior tendon tear, nontraumatic 11/13/2019   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   CPAP (continuous positive airway pressure) dependence 03/25/2018   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   Erectile dysfunction 06/20/2017   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 03/06/2017   Peripheral neuropathy 06/19/2016   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   Hearing loss 02/02/2016   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   Allergic rhinitis 02/02/2016   Atypical chest pain 11/24/2015   Preventative health care 11/24/2015   Onychomycosis 06/30/2015   Degenerative disc disease, lumbar 06/30/2015   Other allergic rhinitis 02/18/2015   Deviated nasal septum 02/18/2015   RLS (restless legs syndrome) 02/18/2015   GERD (gastroesophageal reflux disease) 09/18/2014   Hyperglycemia 03/03/2014   Rotator cuff tear 07/27/2013   Sleep apnea with use of continuous positive airway pressure (CPAP) 07/15/2013   Carotid stenosis 02/24/2013   Nonspecific abnormal electrocardiogram (ECG) (EKG) 01/06/2013   DJD (degenerative joint disease) of hip 12/24/2012   Lower GI bleed 12/12/2012   Right groin pain 11/29/2012   Ventral hernia 07/08/2012   High risk medication use 01/18/2012   Dyspnea 06/12/2011   Hyperlipidemia with target LDL less than 130 12/27/2010   Osteoarthrosis, unspecified whether generalized or localized, pelvic region and thigh 07/25/2010   Osteoarthrosis, hand 06/13/2010   Anemia 12/17/2009   Hypothyroidism 05/13/2009   Hyperlipidemia 05/13/2009   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   Benign essential hypertension 05/13/2009   Right inguinal hernia 05/13/2009   HIATAL HERNIA 05/13/2009   NEPHROLITHIASIS 05/13/2009   ROSACEA 05/13/2009   Other ill-defined and  unknown causes of morbidity and mortality 09/12/1999   Kerin Perna, PTA 10/24/21 10:19 AM   [name], [place], [address] Phone: [phone]   Fax:  [phone]  Name: [name] Pratt MRN: [id number] Date of Birth: 11-17-1940

## 2021-10-25 ENCOUNTER — Other Ambulatory Visit: Payer: Self-pay | Admitting: *Deleted

## 2021-10-25 MED ORDER — LEVOTHYROXINE SODIUM 75 MCG PO TABS: ORAL_TABLET | ORAL | 1 refills | Status: DC

## 2021-10-27 ENCOUNTER — Encounter: Payer: Self-pay | Admitting: Physical Therapy

## 2021-10-27 ENCOUNTER — Other Ambulatory Visit: Payer: Self-pay

## 2021-10-27 ENCOUNTER — Telehealth: Payer: Self-pay

## 2021-10-27 ENCOUNTER — Ambulatory Visit: Payer: Medicare HMO | Admitting: Physical Therapy

## 2021-10-27 DIAGNOSIS — R2689 Other abnormalities of gait and mobility: Secondary | ICD-10-CM

## 2021-10-27 DIAGNOSIS — M6281 Muscle weakness (generalized): Secondary | ICD-10-CM | POA: Diagnosis not present

## 2021-10-27 DIAGNOSIS — R29818 Other symptoms and signs involving the nervous system: Secondary | ICD-10-CM | POA: Diagnosis not present

## 2021-10-27 DIAGNOSIS — H8111 Benign paroxysmal vertigo, right ear: Secondary | ICD-10-CM | POA: Diagnosis not present

## 2021-10-27 DIAGNOSIS — R2681 Unsteadiness on feet: Secondary | ICD-10-CM

## 2021-10-27 DIAGNOSIS — R42 Dizziness and giddiness: Secondary | ICD-10-CM | POA: Diagnosis not present

## 2021-10-27 NOTE — Therapy (Signed)
[place], [place], [address] Phone: [phone]   Fax:  [phone]  Physical Therapy Treatment  Patient Details  Name: [name] Pratt MRN: [id number] Date of Birth: 1941-06-08 Referring Provider (PT): [name], NP   Encounter Date: 10/27/2021   PT End of Session - 10/27/21 1248     Visit Number 6    Number of Visits 16    Date for PT Re-Evaluation 11/30/21    Authorization Type [name]    Authorization - Visit Number 6    Progress Note Due on Visit 10    PT Start Time 2878    PT Stop Time 1226    PT Time Calculation (min) 41 min    Equipment Utilized During Treatment Gait belt    Activity Tolerance Patient tolerated treatment well    Behavior During Therapy [facility] for tasks assessed/performed             Past Medical History:  Diagnosis Date   Allergic rhinitis 02/02/2016   Anemia 12/17/2009   Formatting of this note might be different from the original. Anemia  10/1 IMO update   Aortic atherosclerosis ([place]) 03/05/2020   Arthritis    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   Atypical chest pain 11/24/2015   Benign essential hypertension 05/13/2009   Qualifier: Diagnosis of  By: Larwance Sachs of this note might be different from the original. Hypertension   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[place]) 07/22/2021   Body mass index (BMI) 25.0-25.9, adult 11/02/2020   C. difficile colitis 04/11/2021   Cancer ([place])    skin cancer   Carotid stenosis    a. Carotid U/S 6/76: LICA < 72%, RICA 09-47%;  b.  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   Cystoid  macular edema of both eyes 07/28/2020   Degenerative disc disease, lumbar 06/30/2015   Deviated nasal septum 02/18/2015   Diverticulosis 03/03/2020   DJD (degenerative joint disease) of hip 12/24/2012   Dyspnea 06/12/2011   Emphysema, unspecified ([place]) 03/05/2020   Erectile dysfunction 06/20/2017   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[place]) 02/09/2021   Gait disorder 03/02/2021   GERD (gastroesophageal reflux disease)    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   Qualifier: Diagnosis of  By: Burnett Kanaris     Greater trochanteric pain syndrome 07/22/2021   Hearing loss 02/02/2016   Heme positive stool 02/29/2020   HIATAL HERNIA 05/13/2009   Qualifier: Diagnosis of  By: Burnett Kanaris     High risk medication use 01/18/2012   Hip pain 03/02/2021   History of chicken pox    History of hemorrhoids    History of kidney stones    History of total bilateral knee replacement 07/08/2020   Hyperglycemia 03/03/2014   Hyperlipidemia    under control   Hyperlipidemia with target LDL less than 130 12/27/2010   Formatting of this note might be different from the original. Cardiology - Dr Frances Nickels at [facility] cardiology  ICD-10 cut over   Hypertension    under control   Hypokalemia 07/08/2021   Hypothyroidism    Ileus ([place])  Internal hemorrhoids 03/03/2020   Leg cramps 11/13/2019   Lower GI bleed 12/12/2012   Lumbago of lumbar region with sciatica 10/21/2020   Lumbar radiculopathy 03/02/2021   Lumbar spondylosis 11/02/2020   Nasal congestion 04/11/2021   NEPHROLITHIASIS 05/13/2009   Qualifier: Diagnosis of  By: Burnett Kanaris     Nonspecific abnormal electrocardiogram (ECG) (EKG) 01/06/2013   Onychomycosis 06/30/2015   Osteoarthrosis, 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 Osteoarthritis Of The Hand  10/1 IMO update   Osteoarthrosis, unspecified whether generalized or localized, pelvic region and thigh 07/25/2010   Formatting of  this note might be different from the original. Osteoarthritis Of The Hip   Other abnormal glucose 07/22/2021   Other allergic rhinitis 02/18/2015   Other ill-defined and unknown causes of morbidity and mortality 09/12/1999   Nov 20, 2000 Entered By: Alycia Patten Comment: ltFeb 23, 2006 Entered By: Alycia Patten Comment: rt 11/05   Peripheral neuropathy 06/19/2016   PONV (postoperative nausea and vomiting)    Preventative health care 11/24/2015   Right groin pain 11/29/2012   Right inguinal hernia 05/13/2009   Qualifier: Diagnosis of  By: Burnett Kanaris     RLS (restless legs syndrome) 02/18/2015   Rosacea    Rotator cuff tear 07/27/2013   Situational depression 03/02/2021   Sleep apnea with use of continuous positive airway pressure (CPAP) 07/15/2013   CPAP set to  7 cm water,  Residual AHi was 7.8  With no leak.  User time 6 hours.  479 days , not used only 12 days - highly compliant 06-23-13  .    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   Urinary retention 11/26/2020   Ventral hernia 07/08/2012   Vertigo 07/08/2021   Weight loss 03/03/2020    Past Surgical History:  Procedure Laterality Date   ABDOMINAL HERNIA REPAIR  07/15/12   Dr [name]   ANTERIOR CERVICAL DECOMP/DISCECTOMY FUSION N/A 07/09/2020   Procedure: ANTERIOR CERVICAL DECOMPRESSION AND FUSION CERVICAL THREE-FOUR.;  Surgeon: [name], MD;  Location: [facility];  Service: Neurosurgery;  Laterality: N/A;  anterior   BROW LIFT  05/07/01   CYSTOSCOPY WITH URETEROSCOPY AND STENT PLACEMENT Right 01/28/2014   Procedure: CYSTOSCOPY WITH URETEROSCOPY, BASKET RETRIVAL AND  STENT PLACEMENT;  Surgeon: [name], MD;  Location: [facility];  Service:  Urology;  Laterality: Right;   epidural injections     multiple procedures   ESOPHAGEAL DILATION  1993 and 1994   multiple times   EYE SURGERY Bilateral 03/25/10, 2012   cataract removal   HEMORRHOID SURGERY  2014   HIATAL HERNIA REPAIR  12/21/93   HOLMIUM LASER APPLICATION Right 2/72/5366   Procedure: HOLMIUM LASER APPLICATION;  Surgeon: [name], MD;  Location: [facility];  Service: Urology;  Laterality: Right;   INGUINAL HERNIA REPAIR Right 07/15/12   Dr [name], x2   JOINT REPLACEMENT  07/25/04   right knee   JOINT REPLACEMENT  07/13/10   left knee   [place]  09/20/06   with intraoperative cholangiogram and right inguinal herniorrhaphy with mesh    LIGAMENT REPAIR Left 07/2013   shoulder   LITHOTRIPSY  x2   NASAL SEPTUM SURGERY  1975   POSTERIOR CERVICAL FUSION/FORAMINOTOMY N/A 11/24/2020   Procedure: Posterior Cervical Laminectomy Cervical three-four, Cervical four-five with lateral mass fusion;  Surgeon: [name], MD;  Location: [facility];  Service: Neurosurgery;  Laterality: N/A;   REFRACTIVE SURGERY Bilateral    Right total hip replacement  4/14   SKIN CANCER DESTRUCTION     nose, ear   TONSILLECTOMY  as child   TOTAL HIP ARTHROPLASTY Left    URETHRAL DILATION  1991   [name]    There were no vitals filed for this visit.   Subjective Assessment - 10/27/21 1249     Subjective Pt reports he woke up feeling pretty good.  He does some stretches every morning before he gets out of bed. No episodes of dizziness nor any falls.    Pertinent History spondylitic myelopathy, ACDF, HTN, COPD, cervical decompression, sleep apnea    Patient Stated Goals "get a lot better."    Currently in Pain? No/denies    Pain Score 0-No pain                [facility] PT Assessment - 10/27/21 0001       Assessment   Medical Diagnosis R26.9 (ICD-10-CM) - Gait disorder  R42 (ICD-10-CM) - Vertigo    Referring Provider (PT) [name], NP     Onset Date/Surgical Date 08/27/21    Hand Dominance Right              [facility] Adult PT Treatment/Exercise - 10/27/21 0001       Knee/Hip Exercises: Stretches   Passive Hamstring Stretch Right;Left;2 reps;30 seconds   seated, hip hinge   Piriformis Stretch Right;Left;1 rep;20 seconds   seated, fig 4     Knee/Hip Exercises: Aerobic   Recumbent Bike L1: 4.5 min for warm up             Balance Exercises - 10/27/21 0001       Balance Exercises: Standing   Standing Eyes Opened Narrow base of support (BOS);Foam/compliant surface;Wide (BOA);Head turns;20 secs   (completed in corner) intermittent UE to steady   Standing Eyes Closed Wide (BOA);Foam/compliant surface;2 reps;10 secs    SLS Eyes open;Solid surface;Upper extremity support 1;2 reps;10 secs    Stepping Strategy Anterior;5 reps   each leg, placing cone on waist height surface   Other Standing Exercises side stepping over 3 pool noodles (CGA) x 4 reps.  wide stance with lateral R/L weight shifts to reach cone on waist height surface x 5 each direction.  Alternating toe taps to 6" step x 10, one episode of LOB with weight shift to RLE    Other Standing Exercises Comments holding weighted ball (2.2#) in/out from core, and wood chop x 10 each direction (standing on foam).                  PT Short Term Goals - 10/20/21 1157       PT SHORT TERM GOAL #1   Title Pt. will complete Berg.    Baseline 42/56    Time 2    Period Weeks    Status Achieved    Target Date 10/19/21               PT Long Term Goals - 10/20/21 1240       PT LONG TERM GOAL #1   Title The patient will be indep with progressed HEP for gait and balance.    Time  8    Period Weeks    Status On-going    Target Date 11/30/21      PT LONG TERM GOAL #2   Title The patient will improve Berg balance scale to 45/56 to decrease risk of falls.    Baseline 42/56    Time 8    Period Weeks    Status On-going    Target Date 11/30/21      PT  LONG TERM GOAL #3   Title Patient will demonstrate improved functional strength by completeing 5x STS in < 16 seconds.    Baseline 10/05/21- 21 seconds.    Time 8    Period Weeks    Status On-going    Target Date 11/30/21      PT LONG TERM GOAL #4   Title Pt will maintain TUG at 14.5 seconds to demo maintained mobility    Baseline 10/05/21- 18.75 seconds without cane    Time 8    Period Weeks    Status On-going    Target Date 11/30/21      PT LONG TERM GOAL #5   Title Pt will demo no s/s of BPPV in all positions of canalith testing    Time 8    Period Weeks    Status Achieved    Target Date 11/30/21                   Plan - 10/27/21 1244     Clinical Impression Statement Pt had posterior loss of balance with vertical head movements while standing on foam (in corner), as well as with eyes closed while standing on foam; required UE support to regain balance.  CGA to steady during weight shifts into SLS for side stepping and toe taps to 6" step.  Pt requires minor cues for more upright posture (shoulders over hips vs forward flexion).  Plan to continue dynamic balance exercises.    Personal Factors and Comorbidities Comorbidity 3+;Time since onset of injury/illness/exacerbation    Comorbidities HTN, COPD, spondylitis myelopathy, falls    Examination-Activity Limitations Squat;Stairs;Locomotion Level;Bed Mobility;Bend    Examination-Participation Restrictions [employer] Activity    Stability/Clinical Decision Making Evolving/Moderate complexity    Rehab Potential Good    PT Frequency 2x / week    PT Duration 8 weeks    PT Treatment/Interventions ADLs/Self Care Home Management;Patient/family education;Balance training;Neuromuscular re-education;Gait training;Therapeutic activities;Therapeutic exercise;Functional mobility training;Taping;Manual techniques;Vestibular;Canalith Repostioning    PT Next Visit Plan Berg activities; gait; balance.    PT Home Exercise Plan  [id number] (from previous episode)    Consulted and Agree with Plan of Care Patient             Patient will benefit from skilled therapeutic intervention in order to improve the following deficits and impairments:  Pain, Decreased activity tolerance, Decreased balance, Decreased strength, Decreased range of motion, Impaired flexibility, Impaired tone, Increased fascial restricitons, Dizziness, Abnormal gait, Increased edema  Visit Diagnosis: Muscle weakness (generalized)  Unsteadiness on feet  Other abnormalities of gait and mobility     Problem List Patient Active Problem List   Diagnosis Date Noted   Benign paroxysmal positional vertigo 09/14/2021   Palpitations 07/25/2021   Cardiac murmur 07/25/2021   History of chicken pox 07/22/2021   History of hemorrhoids 07/22/2021   Hypertension 07/22/2021   COPD (chronic obstructive pulmonary disease) ([place]) 07/22/2021   History of kidney stones 07/22/2021   Cancer ([place]) 74/25/9563   Complication of anesthesia 07/22/2021   PONV (postoperative nausea and vomiting)  07/22/2021   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HCC) 07/22/2021   Other abnormal glucose 07/22/2021   Spondylitis ([place]) 07/22/2021   Greater trochanteric pain syndrome 07/22/2021   Vertigo 07/08/2021   Hypokalemia 07/08/2021   Trochanteric bursitis of left hip 06/30/2021   C. 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[place]) 02/09/2021   Urinary retention 11/26/2020   Cervical myelopathy (HCC) 11/24/2020   Body mass index (BMI) 25.0-25.9, adult 11/02/2020   Lumbar spondylosis 11/02/2020   Lumbago of lumbar region with sciatica 10/21/2020   Cystoid macular edema of both eyes 07/28/2020   Status post cervical spinal fusion 07/09/2020   History of total bilateral knee  replacement 07/08/2020   Aortic atherosclerosis ([place]) 03/05/2020   Emphysema, unspecified ([place]) 03/05/2020   Arthritis 03/04/2020   Diverticulosis 03/03/2020   Internal hemorrhoids 03/03/2020   Weight loss 03/03/2020   Heme positive stool 02/29/2020   Leg cramps 11/13/2019   Tibialis anterior tendon tear, nontraumatic 11/13/2019   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   CPAP (continuous positive airway pressure) dependence 03/25/2018   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   Erectile dysfunction 06/20/2017   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 03/06/2017   Peripheral neuropathy 06/19/2016   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   Hearing loss 02/02/2016   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   Allergic rhinitis 02/02/2016   Atypical chest pain 11/24/2015   Preventative health care 11/24/2015   Onychomycosis 06/30/2015   Degenerative disc disease, lumbar 06/30/2015   Other allergic rhinitis 02/18/2015   Deviated nasal septum 02/18/2015   RLS (restless legs syndrome) 02/18/2015   GERD (gastroesophageal reflux disease) 09/18/2014   Hyperglycemia 03/03/2014   Rotator cuff tear 07/27/2013   Sleep apnea with use of continuous positive airway pressure (CPAP) 07/15/2013   Carotid stenosis 02/24/2013   Nonspecific abnormal electrocardiogram (ECG) (EKG) 01/06/2013   DJD (degenerative joint disease) of hip 12/24/2012   Lower GI bleed 12/12/2012   Right groin pain 11/29/2012   Ventral hernia 07/08/2012   High risk medication use 01/18/2012   Dyspnea 06/12/2011   Hyperlipidemia with target LDL less than 130 12/27/2010   Osteoarthrosis, unspecified whether generalized or localized, pelvic region and thigh 07/25/2010   Osteoarthrosis, hand 06/13/2010   Anemia 12/17/2009   Hypothyroidism 05/13/2009   Hyperlipidemia 05/13/2009   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   Benign essential hypertension 05/13/2009   Right inguinal hernia 05/13/2009    HIATAL HERNIA 05/13/2009   NEPHROLITHIASIS 05/13/2009   ROSACEA 05/13/2009   Other ill-defined and unknown causes of morbidity and mortality 09/12/1999   Kerin Perna, PTA 10/27/21 12:53 PM   [facility] [place], [place], [address] Phone: [phone]   Fax:  [phone]  Name: [name] Pratt MRN: [id number] Date of Birth: 1941/01/19

## 2021-10-27 NOTE — Telephone Encounter (Signed)
Called pt to schedule his sleep study however, pt states he is feeling better and does not wish to have this study. Pt was advised to all back to schedule when he is ready.

## 2021-10-28 ENCOUNTER — Ambulatory Visit (INDEPENDENT_AMBULATORY_CARE_PROVIDER_SITE_OTHER): Payer: Medicare HMO | Admitting: Pharmacist

## 2021-10-28 DIAGNOSIS — I10 Essential (primary) hypertension: Secondary | ICD-10-CM

## 2021-10-28 DIAGNOSIS — I700 Atherosclerosis of aorta: Secondary | ICD-10-CM

## 2021-10-28 DIAGNOSIS — E782 Mixed hyperlipidemia: Secondary | ICD-10-CM

## 2021-10-28 DIAGNOSIS — M1A0720 Idiopathic chronic gout, left ankle and foot, without tophus (tophi): Secondary | ICD-10-CM

## 2021-10-28 NOTE — Chronic Care Management (AMB) (Signed)
Chronic Care Management Pharmacy Note  10/30/2021 Name:  [name] Pratt MRN:  [id number] DOB:  07-17-1941  Summary: Updated medication list. Reviewed all medications with patient.  Reviewed home blood pressure readings. Home blood pressure has been at goal.  Discussed gout prevention versus treatment. Also reviewed food that can increase uric acid.   Recommendations/Changes made from today's visit: Continue current blood pressure regimen.     Subjective: [name] Pratt is an 81 y.o. year old male who is a primary patient of [name], NP.  The CCM team was consulted for assistance with disease management and care coordination needs.    Engaged with patient by telephone for follow up visit in response to provider referral for pharmacy case management and/or care coordination services.   Consent to Services:  The patient was given information about Chronic Care Management services, agreed to services, and gave verbal consent prior to initiation of services.  Please see initial visit note for detailed documentation.   Patient Care Team: [name], NP as PCP - General (Internal Medicine) [name], MD as PCP - Cardiology (Cardiology) Zadie Rhine Clent Demark, MD as Consulting Physician (Ophthalmology) [name], MD as Consulting Physician (Cardiology) Dohmeier, Asencion Partridge, MD as Consulting Physician (Neurology) Marshell Garfinkel, MD as Consulting Physician (Pulmonary Disease) Darleen Crocker, MD as Consulting Physician (Ophthalmology) Nyra Capes, DDS as Consulting Physician (Dentistry) Cherre Robins, [place] (Pharmacist)  Recent office visits: 09/13/2021 - PCP [name] Seen for f/u HTN and hypokalemia and vertigo.  Recommended vestibular rehab / PT. 07/08/2021 - Internal Med Inda Castle, NP) Seen for ED follow up / HTN. No med chnages; updated med list wiht amlodipine 53m daily.  Recent consult visits: 10/27/2021 - Neuro - Phone Call -  patient decided not to have sleep study.  10/19/2021 - Nephrology (Dr UHollie Salk Follow up CKD 3B. Advised to stop pickle juice for cramps. Instead use yellow mustard. Mg was 2.2. Scr was 1.19 (10/07/2021) 10/13/2021 - Ophthalmology (Dr RZadie Rhine - Macular Degeneration. Received Avastin eye injection.  09/08/2021 - Neurosurgeon (Dr BTamsen Roers 09/01/2021 - Ophthalmology (Dr RZadie Rhine macular Degeneration. Received Avastin eye injections - right eye 08/11/2021 - Urology (Dr MMatilde Sprang Seen to follow up incontinence. H/O TUPR.GLeafy HalfRx and samples. F/U 4 months. 07/28/2021 - Neurosurgeon (Dr BLanier Ensign seen for lumbar sponsulosis, scarolitis and trochanteric bursitis of left hip. 07/25/2021 - Cardio (Dr RGeraldo Pitter Seen for palpitations, chest pain and HTN. Ordered stress test, ECHO and 2 week heart monitor. Updated med list and removed cholestyramine, cranberr, magnesium and omega 2 FA due to patient not taking.  07/21/2021 - Ophthalmology (Dr RZadie Rhine See for macular degeneration. Received bevacizumab intravitreal injection in right eye. RTC 6 weeks.  Hospital visits: 08/27/2021 - ED Visit for vertigo, nausea and vomiting.  Given IV lorazepam and ondansetron. MIR of brain ordered.  06/24/2021 - ED Visit for heachache, dizziness and HTN.  Increased amlodipine from 5 to 147mdaily. 06/14/2021 - ED Visit for vertigo. Referred to ED after episode of dizziness during physical therapy. Prescribed meclizine 2523mp to 3 times a day as needed.   Objective:  Lab Results  Component Value Date   CREATININE 1.05 08/27/2021   CREATININE 1.43 07/08/2021   CREATININE 1.15 06/24/2021    Lab Results  Component Value Date   HGBA1C 5.7 12/29/2020   Last diabetic Eye exam: No results found for: HMDIABEYEEXA  Last diabetic Foot exam: No results found for: HMDIABFOOTEX      Component Value Date/Time   CHOL 117 09/13/2021 1008  TRIG 80.0 09/13/2021 1008   HDL 50.40 09/13/2021 1008   CHOLHDL 2 09/13/2021 1008    VLDL 16.0 09/13/2021 1008   LDLCALC 50 09/13/2021 1008   LDLCALC 50 06/15/2020 1235    Hepatic Function Latest Ref Rng & Units 06/24/2021 06/15/2021 11/30/2020  Total Protein 6.5 - 8.1 g/dL 7.1 7.5 6.8  Albumin 3.5 - 5.0 g/dL 3.7 4.2 3.2(L)  AST 15 - 41 U/L _0 ALT 0 - 44 U/L _1 Alk Phosphatase 38 - 126 U/L 66 73 77  Total Bilirubin 0.3 - 1.2 mg/dL 0.6 0.5 0.9  Bilirubin, Direct 0.0 - 0.3 mg/dL - - -    Lab Results  Component Value Date/Time   TSH 2.77 06/15/2021 12:50 PM   TSH 0.46 12/29/2020 11:08 AM   FREET4 0.94 06/15/2021 12:50 PM    CBC Latest Ref Rng & Units 08/27/2021 06/24/2021 06/15/2021  WBC 4.0 - 10.5 K/uL 8.9 6.4 6.2  Hemoglobin 13.0 - 17.0 g/dL 12.0(L) 12.2(L) 12.8(L)  Hematocrit 39.0 - 52.0 % 36.6(L) 36.9(L) 39.2  Platelets 150 - 400 K/uL 296 320 324.0    No results found for: VD25OH  Clinical ASCVD: Yes  The ASCVD Risk score (Arnett DK, et al., 2019) failed to calculate for the following reasons:   The 2019 ASCVD risk score is only valid for ages 12 to 48     Social History   Tobacco Use  Smoking Status Former   Years: 11.00   Types: Cigarettes   Quit date: 09/11/1978   Years since quitting: 43.1  Smokeless Tobacco Never   BP Readings from Last 3 Encounters:  09/13/21 (!) 147/64  08/27/21 (!) 159/69  08/01/21 126/60   Pulse Readings from Last 3 Encounters:  09/13/21 70  08/27/21 (!) 53  08/01/21 70   Wt Readings from Last 3 Encounters:  09/13/21 162 lb (73.5 kg)  08/27/21 155 lb (70.3 kg)  08/02/21 161 lb (73 kg)    Assessment: Review of patient past medical history, allergies, medications, health status, including review of consultants reports, laboratory and other test data, was performed as part of comprehensive evaluation and provision of chronic care management services.   SDOH:  (Social Determinants of Health) assessments and interventions performed:  SDOH Interventions    Flowsheet Row Most Recent Value  SDOH  Interventions   Food Insecurity Interventions Intervention Not Indicated  Financial Strain Interventions Intervention Not Indicated  [patient declined to apply for PAP in 2022 for Restasis]  Physical Activity Interventions [place]  Allergies  Allergen Reactions   Pravastatin Other (See Comments)    Muscle cramps   Sildenafil Other (See Comments)    Tachycardia     Oxycodone     hallucinations   Atenolol Other (See Comments)    bradycardia   Elastic Bandages & [Zinc] Other (See Comments)    Teflon bandages - Irritation   Metoclopramide Hcl Anxiety    Medications Reviewed Today     Reviewed by Cherre Robins, RPH-CPP (Pharmacist) on 10/28/21 at [place] List Status: <None>   Medication Order Taking? Sig Documenting Provider Last Dose Status Informant  acetaminophen (TYLENOL) 650 MG CR tablet [id number] Yes Take 1,300 mg by mouth every morning. And take 650 mg at bedtime [provider] Taking Active Self  albuterol (VENTOLIN HFA) 108 (90 Base) MCG/ACT inhaler [id number] Yes INHALE 2 PUFFS INTO THE LUNGS EVERY 6 HOURS AS NEEDED  FOR SHORTNESS OF BREATH. [name], NP Taking Active   Alcohol Swabs (ALCOHOL PADS) 70 % PADS [id number] Yes 1 packet by Does not apply route 2 (two) times daily. [name], NP Taking Active   allopurinol (ZYLOPRIM) 100 MG tablet [id number] Yes Take 100 mg by mouth 2 (two) times daily. [provider] Taking Active   amLODipine (NORVASC) 10 MG tablet [id number] Yes Take 1 tablet (10 mg total) by mouth daily. [name], NP Taking Active   aspirin EC 81 MG tablet [id number] Yes Take 81 mg by mouth every morning. [provider] Taking Active Self  Cholecalciferol (VITAMIN D) 50 MCG (2000 UT) CAPS [id number] Yes Take 2,000 Units by mouth daily. [provider] Taking Active Self  ciprofloxacin (CIPRO) 500 MG tablet [id number] Yes Take 500 mg by mouth 2  (two) times daily. [provider] Taking Active   colchicine 0.6 MG tablet [id number] Yes Take 0.6 mg by mouth as needed for other. For gout [provider] Taking Active   cycloSPORINE (RESTASIS) 0.05 % ophthalmic emulsion [id number] Yes Place 1 drop into both eyes 2 (two) times daily. [provider] Taking Active Self  diclofenac Sodium (VOLTAREN) 1 % GEL [id number] Yes Apply 1 application topically daily as needed for pain (Hand pain).  Taking Active Self  fluocinonide (LIDEX) 0.05 % external solution [id number] Yes Apply 1 application topically 2 (two) times daily as needed for itching or pain. [provider] Taking Active   gabapentin (NEURONTIN) 300 MG capsule [id number] Yes Take 300 mg by mouth 2 (two) times daily as needed. Leg and nerve pain [provider] Taking Active   GEMTESA 75 MG TABS [id number] Yes Take 1 tablet by mouth daily. [provider] Taking Active   glucose blood test strip [id number] Yes TRUE Metrix Blood Glucose Test Strip, use as instructed [name], NP Taking Active   Homeopathic Products ([place]) FOAM [id number] Yes Apply 1 application topically 2 (two) times daily as needed (Pain). [provider] Taking Active Self  hydrALAZINE (APRESOLINE) 50 MG tablet [id number] Yes Take 1 tablet (50 mg total) by mouth 2 (two) times daily. [name], NP Taking Active   Lancets [place] [id number] Yes 1 each by Does not apply route 2 (two) times daily. TRUE matrix lancets [name], NP Taking Active   levothyroxine (SYNTHROID) 75 MCG tablet [id number] Yes TAKE 2 TABS BY MOUTH ONCE DAILY 6 DAYS A WEEK AND 1 TAB BY MOUTH ONCE DAILY ONE DAY A WEEK [name], NP Taking Active   losartan (COZAAR) 100 MG tablet [id number] Yes TAKE 1 TABLET EVERY Charlton Haws, NP Taking Active   Magnesium 500 MG TABS [id number] Yes Take by mouth. OTC [provider] Taking Active Self   meclizine (ANTIVERT) 25 MG tablet [id number] Yes Take 1 tablet (25 mg total) by mouth 3 (three) times daily as needed for dizziness. Deno Etienne, DO Taking Active   multivitamin [facility]) per tablet [id number] Yes Take 1 tablet by mouth daily. [provider] Taking Active Self  nitroGLYCERIN (NITROSTAT) 0.4 MG SL tablet [id number] Yes Place 1 tablet (0.4 mg total) under the tongue every 5 (five) minutes as needed for chest pain. [name], MD Taking Active   polyethylene glycol (MIRALAX / GLYCOLAX) 17 g packet [id number] Yes Take 17-34 g by mouth daily as needed. [provider] Taking Active   rosuvastatin (CRESTOR) 5 MG tablet [id number] Yes TAKE 1 TABLET EVERY DAY [name], NP Taking Active  sodium chloride (OCEAN) 0.65 % nasal spray [id number] Yes Place 1 spray into both nostrils at bedtime as needed for congestion. [provider] Taking Active Self            Patient Active Problem List   Diagnosis Date Noted   Benign paroxysmal positional vertigo 09/14/2021   Palpitations 07/25/2021   Cardiac murmur 07/25/2021   History of chicken pox 07/22/2021   History of hemorrhoids 07/22/2021   Hypertension 07/22/2021   COPD (chronic obstructive pulmonary disease) ([place]) 07/22/2021   History of kidney stones 07/22/2021   Cancer ([place]) 69/45/0388   Complication of anesthesia 07/22/2021   PONV (postoperative nausea and vomiting) 07/22/2021   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[place]) 07/22/2021   Other abnormal glucose 07/22/2021   Spondylitis ([place]) 07/22/2021   Greater trochanteric pain syndrome 07/22/2021   Vertigo 07/08/2021   Hypokalemia 07/08/2021   Trochanteric bursitis of left hip 06/30/2021   C. 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[place])  02/09/2021   Urinary retention 11/26/2020   Cervical myelopathy ([place]) 11/24/2020   Body mass index (BMI) 25.0-25.9, adult 11/02/2020   Lumbar spondylosis 11/02/2020   Lumbago of lumbar region with sciatica 10/21/2020   Cystoid macular edema of both eyes 07/28/2020   Status post cervical spinal fusion 07/09/2020   History of total bilateral knee replacement 07/08/2020   Aortic atherosclerosis ([place]) 03/05/2020   Emphysema, unspecified ([place]) 03/05/2020   Arthritis 03/04/2020   Diverticulosis 03/03/2020   Internal hemorrhoids 03/03/2020   Weight loss 03/03/2020   Heme positive stool 02/29/2020   Leg cramps 11/13/2019   Tibialis anterior tendon tear, nontraumatic 11/13/2019   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   CPAP (continuous positive airway pressure) dependence 03/25/2018   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   Erectile dysfunction 06/20/2017   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 03/06/2017   Peripheral neuropathy 06/19/2016   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   Hearing loss 02/02/2016   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   Allergic rhinitis 02/02/2016   Atypical chest pain 11/24/2015   Preventative health care 11/24/2015   Onychomycosis 06/30/2015   Degenerative disc disease, lumbar 06/30/2015   Other allergic rhinitis 02/18/2015   Deviated nasal septum 02/18/2015   RLS (restless legs syndrome) 02/18/2015   GERD (gastroesophageal reflux disease) 09/18/2014   Hyperglycemia 03/03/2014   Rotator cuff tear 07/27/2013   Sleep apnea with use of continuous positive airway pressure (CPAP) 07/15/2013   Carotid stenosis 02/24/2013   Nonspecific abnormal electrocardiogram (ECG) (EKG) 01/06/2013   DJD (degenerative joint disease) of hip 12/24/2012   Lower GI bleed 12/12/2012   Right groin pain 11/29/2012   Ventral hernia 07/08/2012   High risk medication use 01/18/2012   Dyspnea 06/12/2011   Hyperlipidemia with  target LDL less than 130 12/27/2010   Osteoarthrosis, unspecified whether generalized or localized, pelvic region and thigh 07/25/2010   Osteoarthrosis, hand 06/13/2010   Anemia 12/17/2009   Hypothyroidism 05/13/2009   Hyperlipidemia 05/13/2009   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   Benign essential hypertension 05/13/2009   Right inguinal hernia 05/13/2009   HIATAL HERNIA 05/13/2009   NEPHROLITHIASIS 05/13/2009   ROSACEA 05/13/2009   Other ill-defined and unknown causes of morbidity and mortality 09/12/1999    Immunization History  Administered Date(s) Administered   Fluad Quad(high Dose 65+) 05/29/2019, 05/28/2020, 06/24/2021  H1N1 08/20/2008   Influenza Split 05/17/2012   Influenza, High Dose Seasonal PF 06/19/2016, 06/20/2017, 06/17/2018   Influenza,inj,Quad PF,6+ Mos 05/26/2013, 06/03/2014, 05/28/2015   Influenza-Unspecified 10/12/2000, 06/11/2001, 06/11/2002, 10/12/2003, 08/11/2004, 07/12/2005, 06/27/2006, 07/13/2007, 06/11/2008, 08/11/2009   PFIZER(Purple Top)SARS-COV-2 Vaccination 10/01/2019, 10/22/2019, 06/15/2020   Pfizer Covid-19 Vaccine Bivalent Booster 44yr & up 09/13/2021   Pneumococcal Conjugate-13 08/25/2013   Pneumococcal Polysaccharide-23 09/11/2008   Pneumococcal-Unspecified 05/29/2005   Tdap 12/27/2010   Zoster Recombinat (Shingrix) 07/01/2018, 09/19/2018   Zoster, Live 07/10/2008    Conditions to be addressed/monitored: CAD, HTN, HLD, Pulmonary Disease, Gout, Hypothyroidism, Overactive Bladder, and macular edema  Care Plan : General Pharmacy (Adult)  Updates made by ECherre Robins RPH-CPP since 10/30/2021 12:00 AM     Problem: Chronic Conditions: Gout; HTN; CKD 3B; OAB; HDL; dry eye syndrome; macular degeneration; hypothyroidism; neuropathy      Long-Range Goal: Provide education, support and care coordination for medication therapy and chronic conditions   Note:   Current Barriers:  Unable to independently afford treatment regimen Unable to maintain control of  hypertension   Pharmacist Clinical Goal(s):  Over the next 90 days, patient will verbalize ability to afford treatment regimen maintain control of blood pressure / HTN as evidenced by blood pressure <140/90  through collaboration with PharmD and provider.   Interventions: 1:1 collaboration with ODebbrah Alar NP regarding development and update of comprehensive plan of care as evidenced by provider attestation and co-signature Inter-disciplinary care team collaboration (see longitudinal plan of care) Comprehensive medication review performed; medication list updated in electronic medical record  Hypertension: Improved home blood pressure readings; BP goal is:  <130/80  Current treatment:  Losartan 1066mdaily Amlodipine 1032maily  Hydralazine 4m58mtake 1 tablet twice a day Today's home blood pressure was 136/60 Heart rate: 68 to 80 Occasional dizziness - going to vestibular physical therapy. Completed cardiology work up in December 2022 with ECHO (08/24/2021) , stress test (08/02/2021) and 2 week heart monitor (08/01/2021) Interventions:  Encouraged patient to continue current regimen for blood pressure Continue to check blood pressure at home 2 to 3 times per week and record for future visits  Hyperlipidemia (aortic atherosclerosis) : Controlled; Goal: LDL <70; Triglycerides <150 Current treatment: Rosuvastatin 5mg 4mly Aspirin 81mg 15my Nitroglycerin 0.4mg as44meded Medications previously tried: None  Interventions:  Reviewed lipid goals Recommended continue current therapy to lower lipids  Emphysema:  Controlled Current treatment: Albuterol inhaler - inhaler 2 puffs into lungs up to every 6 hours as needed for shortness of breath   Last spirometry score: 12/01/2016 FVC: 3.62 (103%)  FEV1: 2.67 (106%) FEV1/FVC: 74% "Minimal airway obstruction is present suggesting small airway disease." No exacerbations requiring treatment in the last 6 months  Patient has  failed these meds in past: symbicort (low efficacy) Using maintenance inhaler regularly? No (not prescribed) Frequency of rescue inhaler use:  infrequently (once or twice a month) Interventions:  Reviewed when to use rescue inhaler Reminded to contact provider if frequency of albuterol inhaler use increases to more than twice per week.   Macular Degeneration and Dry Eyes:  Managed by Dr Bevis CTalbert Forestt therapy:  Avastin eye injection about every 6 weeks.  Restasis - 1 drop in both eyes twice a day Patient reports cost of Restasis is high at end of year due to Medicare coverage gap. Send application for patient assistance program in 2022 but patient declined to apply Interventions:  Reviewed 2023 [employer] benefits - generic cyclosporin eye drops not on formulary. Branded Restasis  still tier 3 or $45/month and $125 per 90 days.  Continue current therapy Continue to follow up with Dr Talbert Forest.    Gout:  Patient not sure that symptoms he experiences from time to time might be gout. Report occasional pain in his ankle Current therapy:  Allopurinol 147m twice a day Colchicine 0.652mas needed for gout Lab Results  Component Value Date   LABURIC 7.4 11/13/2019  Goal Uric acid < 6.0 Interventions:  Discussed preventative therapy (allopurinol) versus treatment therapy (colchicine) Recommended he make appointment to see PCP if he is having pain in joints, can check uric acid level to see if is related to gout.  Discussed foods that can increase uric acid and recommended avoid or limit intake.   Health Maintenance:  Reviewed vaccination history and discussed benefits Tetanus vaccination Get tetanus booster in 2023.   Medication management Pharmacist Clinical Goal(s): Over the next 90 days, patient will work with PharmD and providers to maintain optimal medication adherence Current pharmacy: [facility] Interventions Comprehensive medication review performed. Reviewed refill history and assessed  adherence Continue current medication management strategy  Patient Goals/Self-Care Activities Over the next 90 days, patient will:  take medications as prescribed,  check blood pressure 2 to 3 times per week, document, and provide at future appointments Get Tetanus vaccine either at [facility] local pharmacy  Avoid / limit foods that can increase uric acid  Follow Up Plan: Telephone follow up appointment with care management team member scheduled for:  3 to 6 months.            Medication Assistance:  mailed patient application for Restasis patient assistance program in 2022 but patient decided not to apply.   Patient's preferred pharmacy is:  [place]: [phone]: [phone] [place]: [phone]: [phone] Follow Up:  Patient agrees to Care Plan and Follow-up.  Plan: Telephone follow up appointment with care management team member scheduled for:  3 to 6 months  TaCherre RobinsPharmD Clinical Pharmacist [facility]OkreekiEagle [facility]

## 2021-10-30 NOTE — Patient Instructions (Signed)
[name] It was a pleasure speaking with you today.  I have attached a summary of our visit today and information about your health goals. (See below)   Patient Goals/Self-Care Activities Over the next 90 days, patient will:  take medications as prescribed,  check blood pressure 2 to 3 times per week, document, and provide at future appointments Get Tetanus vaccine either at [place] or local pharmacy  Avoid / limit foods that can increase uric acid  If you have any questions or concerns, please feel free to contact me either at the phone number below or with a MyChart message.   Keep up the good work!  [name] Pratt, PharmD Clinical Pharmacist [facility] [phone] (direct line)  [phone] (main office number)  Chronic Care Management CARE PLAN (updated 10/28/2021)    Hypertension BP Readings from Last 3 Encounters:  09/13/21 (!) 147/64  08/27/21 (!) 159/69  08/01/21 126/60   Pharmacist Clinical Goal(s): Over the next 90 days, patient will work with PharmD and providers to achieve BP goal <130/80 Current regimen:  Hydralazine 50mg  twice daily Losartan 100mg  daily  Amlodipine 10mg  daily  Interventions: Requested patient to check blood pressure 2 to 3 times per week and document Patient self care activities - Over the next 90 days, patient will: Check blood pressure 2 to 3 times per week, document, and provide at future appointments Ensure daily salt intake < 2300 mg/day Continue current regimen for blood pressure  Hyperlipidemia/elevated cholesterol Lab Results  Component Value Date/Time   LDLCALC 50 09/13/2021 10:08 AM   LDLCALC 50 06/15/2020 12:35 PM   Pharmacist Clinical Goal(s): Over the next 90 days, patient will work with PharmD and providers to maintain LDL goal < 70 and triglycerides <150 Current regimen:  Rosuvastatin 5mg  daily Nitroglycerin 0.4mg  as needed Interventions: Discussed LDL  goals Patient self care activities - Over the next 90 days, patient will: Maintain cholesterol medication regimen  Emphysema / breathing:  Current treatment: Albuterol inhaler - inhaler 2 puffs into lungs up to every 6 hours as needed for shortness of breath   Interventions:  Reviewed when to use rescue inhaler Contact provider if frequency of albuterol inhaler use increases to more than twice per week.   Macular Degeneration and Dry Eyes:  Managed by Dr Talbert Forest Current therapy:  Avastin eye injection about every 6 weeks.  Restasis - 1 drop in both eyes twice a day Patient reports cost of Restasis is high at end of year due to Medicare coverage gap. He has enough for the next 2 months.  Interventions:  Mailed application for Restasis patient assistance that he can use in 2023 if he chooses.  Continue current therapy Continue to follow up with Dr Talbert Forest.     Gout:  Current therapy:  Allopurinol 100mg  twice a day Colchicine 0.6mg  as needed for gout Goal Uric acid < 6.0 Interventions:  Discussed preventative therapy (allopurinol) versus treatment therapy (colchicine) Make appointment to see PCP if you continue to have pain in your joints. She can check uric acid level to see if it is related to gout.  Avoid or limit intake of foods we discussed can increase uric acid - shellfish, organ meats, red meat, alcohol and sugary beverages (soda)   Health Maintenance:  Reviewed vaccination history and discussed benefits of  Tetanus vaccinations Get Tetanus booster for 2023.  Should have better coverage by Medicare.  Medication management Pharmacist Clinical Goal(s): Over the next 90 days, patient  will work with PharmD and providers to maintain optimal medication adherence Current pharmacy: [facility] Interventions Comprehensive medication review performed. Continue current medication management strategy Updated medications list - added Benefiber and removed over-the-counter medications he  is no longer taking.  Patient self care activities - Over the next 90 days, patient will: Focus on medication adherence by filling and taking medications appropriately  Take medications as prescribed Report any questions or concerns to PharmD and/or provider(s)  Patient Goals/Self-Care Activities Over the next 90 days, patient will:  take medications as prescribed,  check blood pressure 2 to 3 times per week, document, and provide at future appointments Get Tetanus vaccine either at [place] or local pharmacy  Avoid / limit foods that can increase uric acid   Patient verbalizes understanding of instructions and care plan provided today and agrees to view in [place]. Active MyChart status confirmed with patient.      Gout Gout is painful swelling of your joints. Gout is a type of arthritis. It is caused by having too much uric acid in your body. Uric acid is a chemical that is made when your body breaks down substances called purines. If your body has too much uric acid, sharp crystals can form and build up in your joints. This causes pain and swelling. Gout attacks can happen quickly and be very painful (acute gout). Over time, the attacks can affect more joints and happen more often (chronic gout). What are the causes? Too much uric acid in your blood. This can happen because: Your kidneys do not remove enough uric acid from your blood. Your body makes too much uric acid. You eat too many foods that are high in purines. These foods include organ meats, some seafood, and beer. Trauma or stress. What increases the risk? Having a family history of gout. Being male and middle-aged. Being male and having gone through menopause. Being very overweight (obese). Drinking alcohol, especially beer. Not having enough water in the body (being dehydrated). Losing weight too quickly. Having an organ transplant. Having lead poisoning. Taking certain medicines. Having  kidney disease. Having a skin condition called psoriasis. What are the signs or symptoms? An attack of acute gout usually happens in just one joint. The most common place is the big toe. Attacks often start at night. Other joints that may be affected include joints of the feet, ankle, knee, fingers, wrist, or elbow. Symptoms of an attack may include: Very bad pain. Warmth. Swelling. Stiffness. Shiny, red, or purple skin. Tenderness. The affected joint may be very painful to touch. Chills and fever. Chronic gout may cause symptoms more often. More joints may be involved. You may also have white or yellow lumps (tophi) on your hands or feet or in other areas near your joints. How is this treated? Treatment for this condition has two phases: treating an acute attack and preventing future attacks. Acute gout treatment may include: NSAIDs. Steroids. These are taken by mouth or injected into a joint. Colchicine. This medicine relieves pain and swelling. It can be given by mouth or through an IV tube. Preventive treatment may include: Taking small doses of NSAIDs or colchicine daily. Using a medicine that reduces uric acid levels in your blood. Making changes to your diet. You may need to see a food expert (dietitian) about what to eat and drink to prevent gout. Follow these instructions at home: During a gout attack  If told, put ice on the painful area: Put ice in a plastic  bag. Place a towel between your skin and the bag. Leave the ice on for 20 minutes, 2-3 times a day. Raise (elevate) the painful joint above the level of your heart as often as you can. Rest the joint as much as possible. If the joint is in your leg, you may be given crutches. Follow instructions from your doctor about what you cannot eat or drink. Avoiding future gout attacks Eat a low-purine diet. Avoid foods and drinks such as: Liver. Kidney. Anchovies. Asparagus. Herring. Mushrooms. Mussels. Beer. Stay at a  healthy weight. If you want to lose weight, talk with your doctor. Do not lose weight too fast. Start or continue an exercise plan as told by your doctor. Eating and drinking Drink enough fluids to keep your pee (urine) pale yellow. If you drink alcohol: Limit how much you use to: 0-1 drink a day for women. 0-2 drinks a day for men. Be aware of how much alcohol is in your drink. In the [place] one drink equals one 12 oz bottle of beer (355 mL), one 5 oz glass of wine (148 mL), or one 1 oz glass of hard liquor (44 mL). General instructions Take over-the-counter and prescription medicines only as told by your doctor. Do not drive or use heavy machinery while taking prescription pain medicine. Return to your normal activities as told by your doctor. Ask your doctor what activities are safe for you. Keep all follow-up visits as told by your doctor. This is important. Contact a doctor if: You have another gout attack. You still have symptoms of a gout attack after 10 days of treatment. You have problems (side effects) because of your medicines. You have chills or a fever. You have burning pain when you pee (urinate). You have pain in your lower back or belly. Get help right away if: You have very bad pain. Your pain cannot be controlled. You cannot pee. Summary Gout is painful swelling of the joints. The most common site of pain is the big toe, but it can affect other joints. Medicines and avoiding some foods can help to prevent and treat gout attacks. This information is not intended to replace advice given to you by your health care provider. Make sure you discuss any questions you have with your health care provider. Document Revised: 03/08/2018 Document Reviewed: 03/20/2018 Elsevier Patient Education  [place].

## 2021-10-31 DIAGNOSIS — M706 Trochanteric bursitis: Secondary | ICD-10-CM | POA: Diagnosis not present

## 2021-10-31 DIAGNOSIS — M461 Sacroiliitis, not elsewhere classified: Secondary | ICD-10-CM | POA: Diagnosis not present

## 2021-10-31 DIAGNOSIS — M7061 Trochanteric bursitis, right hip: Secondary | ICD-10-CM | POA: Diagnosis not present

## 2021-10-31 DIAGNOSIS — R200 Anesthesia of skin: Secondary | ICD-10-CM | POA: Diagnosis not present

## 2021-10-31 DIAGNOSIS — M25551 Pain in right hip: Secondary | ICD-10-CM | POA: Diagnosis not present

## 2021-10-31 DIAGNOSIS — I10 Essential (primary) hypertension: Secondary | ICD-10-CM | POA: Diagnosis not present

## 2021-10-31 DIAGNOSIS — Z6826 Body mass index (BMI) 26.0-26.9, adult: Secondary | ICD-10-CM | POA: Diagnosis not present

## 2021-10-31 DIAGNOSIS — M25559 Pain in unspecified hip: Secondary | ICD-10-CM | POA: Diagnosis not present

## 2021-11-01 ENCOUNTER — Other Ambulatory Visit: Payer: Self-pay

## 2021-11-01 ENCOUNTER — Ambulatory Visit: Payer: Medicare HMO | Admitting: Physical Therapy

## 2021-11-01 DIAGNOSIS — H8111 Benign paroxysmal vertigo, right ear: Secondary | ICD-10-CM | POA: Diagnosis not present

## 2021-11-01 DIAGNOSIS — R2681 Unsteadiness on feet: Secondary | ICD-10-CM | POA: Diagnosis not present

## 2021-11-01 DIAGNOSIS — M6281 Muscle weakness (generalized): Secondary | ICD-10-CM

## 2021-11-01 DIAGNOSIS — R42 Dizziness and giddiness: Secondary | ICD-10-CM | POA: Diagnosis not present

## 2021-11-01 DIAGNOSIS — R2689 Other abnormalities of gait and mobility: Secondary | ICD-10-CM | POA: Diagnosis not present

## 2021-11-01 DIAGNOSIS — R29818 Other symptoms and signs involving the nervous system: Secondary | ICD-10-CM | POA: Diagnosis not present

## 2021-11-01 NOTE — Therapy (Signed)
[place], [place], [address] Phone: [phone]   Fax:  [phone]  Physical Therapy Treatment  Patient Details  Name: [name] Pratt MRN: [id number] Date of Birth: 1941/07/26 Referring Provider (PT): [name], NP   Encounter Date: 11/01/2021   PT End of Session - 11/01/21 1023     Visit Number 7    Number of Visits 16    Date for PT Re-Evaluation 11/30/21    Authorization Type [name]    Authorization - Visit Number 7    Progress Note Due on Visit 10    PT Start Time 9390    PT Stop Time 1105    PT Time Calculation (min) 42 min    Equipment Utilized During Treatment Gait belt    Activity Tolerance Patient tolerated treatment well    Behavior During Therapy [facility] for tasks assessed/performed             Past Medical History:  Diagnosis Date   Allergic rhinitis 02/02/2016   Anemia 12/17/2009   Formatting of this note might be different from the original. Anemia  10/1 IMO update   Aortic atherosclerosis ([place]) 03/05/2020   Arthritis    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   Atypical chest pain 11/24/2015   Benign essential hypertension 05/13/2009   Qualifier: Diagnosis of  By: Larwance Sachs of this note might be different from the original. Hypertension   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[place]) 07/22/2021   Body mass index (BMI) 25.0-25.9, adult 11/02/2020   C. difficile colitis 04/11/2021   Cancer ([place])    skin cancer   Carotid stenosis    a. Carotid U/S 3/00: LICA < 92%, RICA 33-00%;  b.  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   Cystoid  macular edema of both eyes 07/28/2020   Degenerative disc disease, lumbar 06/30/2015   Deviated nasal septum 02/18/2015   Diverticulosis 03/03/2020   DJD (degenerative joint disease) of hip 12/24/2012   Dyspnea 06/12/2011   Emphysema, unspecified ([place]) 03/05/2020   Erectile dysfunction 06/20/2017   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[place]) 02/09/2021   Gait disorder 03/02/2021   GERD (gastroesophageal reflux disease)    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   Qualifier: Diagnosis of  By: Burnett Kanaris     Greater trochanteric pain syndrome 07/22/2021   Hearing loss 02/02/2016   Heme positive stool 02/29/2020   HIATAL HERNIA 05/13/2009   Qualifier: Diagnosis of  By: Burnett Kanaris     High risk medication use 01/18/2012   Hip pain 03/02/2021   History of chicken pox    History of hemorrhoids    History of kidney stones    History of total bilateral knee replacement 07/08/2020   Hyperglycemia 03/03/2014   Hyperlipidemia    under control   Hyperlipidemia with target LDL less than 130 12/27/2010   Formatting of this note might be different from the original. Cardiology - Dr Frances Nickels at [facility] cardiology  ICD-10 cut over   Hypertension    under control   Hypokalemia 07/08/2021   Hypothyroidism    Ileus ([place])  Internal hemorrhoids 03/03/2020   Leg cramps 11/13/2019   Lower GI bleed 12/12/2012   Lumbago of lumbar region with sciatica 10/21/2020   Lumbar radiculopathy 03/02/2021   Lumbar spondylosis 11/02/2020   Nasal congestion 04/11/2021   NEPHROLITHIASIS 05/13/2009   Qualifier: Diagnosis of  By: Burnett Kanaris     Nonspecific abnormal electrocardiogram (ECG) (EKG) 01/06/2013   Onychomycosis 06/30/2015   Osteoarthrosis, 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 Osteoarthritis Of The Hand  10/1 IMO update   Osteoarthrosis, unspecified whether generalized or localized, pelvic region and thigh 07/25/2010   Formatting of  this note might be different from the original. Osteoarthritis Of The Hip   Other abnormal glucose 07/22/2021   Other allergic rhinitis 02/18/2015   Other ill-defined and unknown causes of morbidity and mortality 09/12/1999   Nov 20, 2000 Entered By: Alycia Patten Comment: ltFeb 23, 2006 Entered By: Alycia Patten Comment: rt 11/05   Peripheral neuropathy 06/19/2016   PONV (postoperative nausea and vomiting)    Preventative health care 11/24/2015   Right groin pain 11/29/2012   Right inguinal hernia 05/13/2009   Qualifier: Diagnosis of  By: Burnett Kanaris     RLS (restless legs syndrome) 02/18/2015   Rosacea    Rotator cuff tear 07/27/2013   Situational depression 03/02/2021   Sleep apnea with use of continuous positive airway pressure (CPAP) 07/15/2013   CPAP set to  7 cm water,  Residual AHi was 7.8  With no leak.  User time 6 hours.  479 days , not used only 12 days - highly compliant 06-23-13  .    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   Urinary retention 11/26/2020   Ventral hernia 07/08/2012   Vertigo 07/08/2021   Weight loss 03/03/2020    Past Surgical History:  Procedure Laterality Date   ABDOMINAL HERNIA REPAIR  07/15/12   Dr [name]   ANTERIOR CERVICAL DECOMP/DISCECTOMY FUSION N/A 07/09/2020   Procedure: ANTERIOR CERVICAL DECOMPRESSION AND FUSION CERVICAL THREE-FOUR.;  Surgeon: [name], MD;  Location: [facility];  Service: Neurosurgery;  Laterality: N/A;  anterior   BROW LIFT  05/07/01   CYSTOSCOPY WITH URETEROSCOPY AND STENT PLACEMENT Right 01/28/2014   Procedure: CYSTOSCOPY WITH URETEROSCOPY, BASKET RETRIVAL AND  STENT PLACEMENT;  Surgeon: [name], MD;  Location: [facility];  Service:  Urology;  Laterality: Right;   epidural injections     multiple procedures   ESOPHAGEAL DILATION  1993 and 1994   multiple times   EYE SURGERY Bilateral 03/25/10, 2012   cataract removal   HEMORRHOID SURGERY  2014   HIATAL HERNIA REPAIR  12/21/93   HOLMIUM LASER APPLICATION Right 2/72/5366   Procedure: HOLMIUM LASER APPLICATION;  Surgeon: [name], MD;  Location: [facility];  Service: Urology;  Laterality: Right;   INGUINAL HERNIA REPAIR Right 07/15/12   Dr [name], x2   JOINT REPLACEMENT  07/25/04   right knee   JOINT REPLACEMENT  07/13/10   left knee   [place]  09/20/06   with intraoperative cholangiogram and right inguinal herniorrhaphy with mesh    LIGAMENT REPAIR Left 07/2013   shoulder   LITHOTRIPSY  x2   NASAL SEPTUM SURGERY  1975   POSTERIOR CERVICAL FUSION/FORAMINOTOMY N/A 11/24/2020   Procedure: Posterior Cervical Laminectomy Cervical three-four, Cervical four-five with lateral mass fusion;  Surgeon: [name], MD;  Location: [facility];  Service: Neurosurgery;  Laterality: N/A;   REFRACTIVE SURGERY Bilateral    Right total hip replacement  4/14   SKIN CANCER DESTRUCTION     nose, ear   TONSILLECTOMY  as child   TOTAL HIP ARTHROPLASTY Left    URETHRAL DILATION  1991   [name]    There were no vitals filed for this visit.   Subjective Assessment - 11/01/21 1027     Subjective Pt feels that he did too much last session -- he felt his knees hurt him and legs felt weak. Pt went to see MD for his back. He did not get an injection but did get imaging of his hips.    Pertinent History spondylitic myelopathy, ACDF, HTN, COPD, cervical decompression, sleep apnea    Patient Stated Goals "get a lot better."    Currently in Pain? No/denies                [facility] PT Assessment - 11/01/21 0001       Assessment   Medical Diagnosis R26.9 (ICD-10-CM) - Gait disorder  R42 (ICD-10-CM) - Vertigo    Referring Provider (PT) [name], NP    Onset Date/Surgical Date 08/27/21    Hand Dominance Right                           [facility] Adult PT Treatment/Exercise - 11/01/21 0001       Ambulation/Gait   Ambulation Distance (Feet) 90 Feet    Assistive device Straight cane    Gait Pattern Step-through pattern;Decreased dorsiflexion - right;Decreased dorsiflexion - left;Trendelenburg;Wide base of support;Lateral trunk lean to right    Gait Comments 1 lap around gym working on gait with cane on L to support weaker R LE -- difficulty with timing steps with cane support      Knee/Hip Exercises: Aerobic   Nustep L5 x 5 min UEs/LEs      Knee/Hip Exercises: Standing   Heel Raises Both;20 reps    Other Standing Knee Exercises Mini squat 2x10                 Balance Exercises - 11/01/21 0001       Balance Exercises: Standing   Standing Eyes Opened Foam/compliant surface;Wide (BOA);Head turns;20 secs    Standing Eyes Opened Limitations head nods & head turns 2x10    Standing Eyes Closed Wide (BOA);Foam/compliant surface;2 reps;30 secs    Other Standing Exercises side, forward and backward stepping over 4 pool noodles (CGA) x 4 reps.   Alternating toe taps to 6" step x 10, one episode of LOB with weight shift to RLE    Other Standing Exercises Comments on foam: weighted ball forward backward 2x10. Solid ground: weight ball forward with slow marching 2x10                  PT Short Term Goals - 10/20/21 1157       PT SHORT TERM GOAL #1   Title Pt. will complete Berg.    Baseline 42/56    Time 2    Period Weeks    Status Achieved    Target Date 10/19/21  PT Long Term Goals - 10/20/21 1240       PT LONG TERM GOAL #1   Title The patient will be indep with progressed HEP for gait and balance.    Time 8    Period Weeks    Status On-going    Target Date 11/30/21      PT LONG TERM GOAL #2   Title The patient will improve Berg balance scale to 45/56 to  decrease risk of falls.    Baseline 42/56    Time 8    Period Weeks    Status On-going    Target Date 11/30/21      PT LONG TERM GOAL #3   Title Patient will demonstrate improved functional strength by completeing 5x STS in < 16 seconds.    Baseline 10/05/21- 21 seconds.    Time 8    Period Weeks    Status On-going    Target Date 11/30/21      PT LONG TERM GOAL #4   Title Pt will maintain TUG at 14.5 seconds to demo maintained mobility    Baseline 10/05/21- 18.75 seconds without cane    Time 8    Period Weeks    Status On-going    Target Date 11/30/21      PT LONG TERM GOAL #5   Title Pt will demo no s/s of BPPV in all positions of canalith testing    Time 8    Period Weeks    Status Achieved    Target Date 11/30/21                   Plan - 11/01/21 1239     Clinical Impression Statement Continued to work on balance on foam and dynamic balance/stepping. Pt has greatest difficulty with R hip flexing. Continued to work on core/trunk strengthening with weighted ball in standing. Tolerated treatment well. Pt continues to endorse no dizzy symptoms.    Personal Factors and Comorbidities Comorbidity 3+;Time since onset of injury/illness/exacerbation    Comorbidities HTN, COPD, spondylitis myelopathy, falls    Examination-Activity Limitations Squat;Stairs;Locomotion Level;Bed Mobility;Bend    Examination-Participation Restrictions [employer] Activity    Stability/Clinical Decision Making Evolving/Moderate complexity    Rehab Potential Good    PT Frequency 2x / week    PT Duration 8 weeks    PT Treatment/Interventions ADLs/Self Care Home Management;Patient/family education;Balance training;Neuromuscular re-education;Gait training;Therapeutic activities;Therapeutic exercise;Functional mobility training;Taping;Manual techniques;Vestibular;Canalith Repostioning    PT Next Visit Plan Berg activities; gait; balance.    PT Home Exercise Plan [id number] (from previous  episode)    Consulted and Agree with Plan of Care Patient             Patient will benefit from skilled therapeutic intervention in order to improve the following deficits and impairments:  Pain, Decreased activity tolerance, Decreased balance, Decreased strength, Decreased range of motion, Impaired flexibility, Impaired tone, Increased fascial restricitons, Dizziness, Abnormal gait, Increased edema  Visit Diagnosis: Muscle weakness (generalized)  Unsteadiness on feet  Other abnormalities of gait and mobility     Problem List Patient Active Problem List   Diagnosis Date Noted   Benign paroxysmal positional vertigo 09/14/2021   Palpitations 07/25/2021   Cardiac murmur 07/25/2021   History of chicken pox 07/22/2021   History of hemorrhoids 07/22/2021   Hypertension 07/22/2021   COPD (chronic obstructive pulmonary disease) ([place]) 07/22/2021   History of kidney stones 07/22/2021   Cancer ([place]) 65/78/4696   Complication of anesthesia 07/22/2021   PONV (  postoperative nausea and vomiting) 07/22/2021   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HCC) 07/22/2021   Other abnormal glucose 07/22/2021   Spondylitis ([place]) 07/22/2021   Greater trochanteric pain syndrome 07/22/2021   Vertigo 07/08/2021   Hypokalemia 07/08/2021   Trochanteric bursitis of left hip 06/30/2021   C. 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[place]) 02/09/2021   Urinary retention 11/26/2020   Cervical myelopathy (HCC) 11/24/2020   Body mass index (BMI) 25.0-25.9, adult 11/02/2020   Lumbar spondylosis 11/02/2020   Lumbago of lumbar region with sciatica 10/21/2020   Cystoid macular edema of both eyes 07/28/2020   Status post cervical spinal fusion 07/09/2020   History of total bilateral knee replacement 07/08/2020   Aortic  atherosclerosis ([place]) 03/05/2020   Emphysema, unspecified ([place]) 03/05/2020   Arthritis 03/04/2020   Diverticulosis 03/03/2020   Internal hemorrhoids 03/03/2020   Weight loss 03/03/2020   Heme positive stool 02/29/2020   Leg cramps 11/13/2019   Tibialis anterior tendon tear, nontraumatic 11/13/2019   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   CPAP (continuous positive airway pressure) dependence 03/25/2018   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   Erectile dysfunction 06/20/2017   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 03/06/2017   Peripheral neuropathy 06/19/2016   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   Hearing loss 02/02/2016   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   Allergic rhinitis 02/02/2016   Atypical chest pain 11/24/2015   Preventative health care 11/24/2015   Onychomycosis 06/30/2015   Degenerative disc disease, lumbar 06/30/2015   Other allergic rhinitis 02/18/2015   Deviated nasal septum 02/18/2015   RLS (restless legs syndrome) 02/18/2015   GERD (gastroesophageal reflux disease) 09/18/2014   Hyperglycemia 03/03/2014   Rotator cuff tear 07/27/2013   Sleep apnea with use of continuous positive airway pressure (CPAP) 07/15/2013   Carotid stenosis 02/24/2013   Nonspecific abnormal electrocardiogram (ECG) (EKG) 01/06/2013   DJD (degenerative joint disease) of hip 12/24/2012   Lower GI bleed 12/12/2012   Right groin pain 11/29/2012   Ventral hernia 07/08/2012   High risk medication use 01/18/2012   Dyspnea 06/12/2011   Hyperlipidemia with target LDL less than 130 12/27/2010   Osteoarthrosis, unspecified whether generalized or localized, pelvic region and thigh 07/25/2010   Osteoarthrosis, hand 06/13/2010   Anemia 12/17/2009   Hypothyroidism 05/13/2009   Hyperlipidemia 05/13/2009   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   Benign essential hypertension 05/13/2009   Right inguinal hernia 05/13/2009   HIATAL HERNIA 05/13/2009    NEPHROLITHIASIS 05/13/2009   ROSACEA 05/13/2009   Other ill-defined and unknown causes of morbidity and mortality 09/12/1999    [facility] April Gordy Levan, [place], DPT 11/01/2021, 12:43 PM  [facility] [place], [place], [address] Phone: [phone]   Fax:  [phone]  Name: [name] Pratt MRN: [id number] Date of Birth: [birth year]

## 2021-11-03 ENCOUNTER — Other Ambulatory Visit: Payer: Self-pay

## 2021-11-03 ENCOUNTER — Ambulatory Visit: Payer: Medicare HMO | Admitting: Physical Therapy

## 2021-11-03 DIAGNOSIS — R42 Dizziness and giddiness: Secondary | ICD-10-CM | POA: Diagnosis not present

## 2021-11-03 DIAGNOSIS — M6281 Muscle weakness (generalized): Secondary | ICD-10-CM

## 2021-11-03 DIAGNOSIS — R2681 Unsteadiness on feet: Secondary | ICD-10-CM

## 2021-11-03 DIAGNOSIS — R2689 Other abnormalities of gait and mobility: Secondary | ICD-10-CM | POA: Diagnosis not present

## 2021-11-03 DIAGNOSIS — H8111 Benign paroxysmal vertigo, right ear: Secondary | ICD-10-CM | POA: Diagnosis not present

## 2021-11-03 DIAGNOSIS — R29818 Other symptoms and signs involving the nervous system: Secondary | ICD-10-CM | POA: Diagnosis not present

## 2021-11-03 NOTE — Therapy (Signed)
[facility] [place], [place], [address] Phone: [phone]   Fax:  [phone]  Physical Therapy Treatment  Patient Details  Name: [name] Pratt MRN: [id number] Date of Birth: October 05, 1940 Referring Provider (PT): [name], NP   Encounter Date: 11/03/2021   PT End of Session - 11/03/21 1100     Visit Number 8    Number of Visits 16    Date for PT Re-Evaluation 11/30/21    Authorization Type [name]    Authorization - Visit Number 8    Progress Note Due on Visit 10    PT Start Time 1100    PT Stop Time 1140    PT Time Calculation (min) 40 min    Equipment Utilized During Treatment Gait belt    Activity Tolerance Patient tolerated treatment well    Behavior During Therapy [facility] for tasks assessed/performed             Past Medical History:  Diagnosis Date   Allergic rhinitis 02/02/2016   Anemia 12/17/2009   Formatting of this note might be different from the original. Anemia  10/1 IMO update   Aortic atherosclerosis ([place]) 03/05/2020   Arthritis    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   Atypical chest pain 11/24/2015   Benign essential hypertension 05/13/2009   Qualifier: Diagnosis of  By: Larwance Sachs of this note might be different from the original. Hypertension   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[place]) 07/22/2021   Body mass index (BMI) 25.0-25.9, adult 11/02/2020   C. difficile colitis 04/11/2021   Cancer ([place])    skin cancer   Carotid stenosis    a. Carotid U/S 9/62: LICA < 83%, RICA 66-29%;  b.  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   Cystoid  macular edema of both eyes 07/28/2020   Degenerative disc disease, lumbar 06/30/2015   Deviated nasal septum 02/18/2015   Diverticulosis 03/03/2020   DJD (degenerative joint disease) of hip 12/24/2012   Dyspnea 06/12/2011   Emphysema, unspecified ([place]) 03/05/2020   Erectile dysfunction 06/20/2017   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[place]) 02/09/2021   Gait disorder 03/02/2021   GERD (gastroesophageal reflux disease)    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   Qualifier: Diagnosis of  By: Burnett Kanaris     Greater trochanteric pain syndrome 07/22/2021   Hearing loss 02/02/2016   Heme positive stool 02/29/2020   HIATAL HERNIA 05/13/2009   Qualifier: Diagnosis of  By: Burnett Kanaris     High risk medication use 01/18/2012   Hip pain 03/02/2021   History of chicken pox    History of hemorrhoids    History of kidney stones    History of total bilateral knee replacement 07/08/2020   Hyperglycemia 03/03/2014   Hyperlipidemia    under control   Hyperlipidemia with target LDL less than 130 12/27/2010   Formatting of this note might be different from the original. Cardiology - Dr Frances Nickels at [facility] cardiology  ICD-10 cut over   Hypertension    under control   Hypokalemia 07/08/2021   Hypothyroidism    Ileus ([place])  Internal hemorrhoids 03/03/2020   Leg cramps 11/13/2019   Lower GI bleed 12/12/2012   Lumbago of lumbar region with sciatica 10/21/2020   Lumbar radiculopathy 03/02/2021   Lumbar spondylosis 11/02/2020   Nasal congestion 04/11/2021   NEPHROLITHIASIS 05/13/2009   Qualifier: Diagnosis of  By: Burnett Kanaris     Nonspecific abnormal electrocardiogram (ECG) (EKG) 01/06/2013   Onychomycosis 06/30/2015   Osteoarthrosis, 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 Osteoarthritis Of The Hand  10/1 IMO update   Osteoarthrosis, unspecified whether generalized or localized, pelvic region and thigh 07/25/2010   Formatting of  this note might be different from the original. Osteoarthritis Of The Hip   Other abnormal glucose 07/22/2021   Other allergic rhinitis 02/18/2015   Other ill-defined and unknown causes of morbidity and mortality 09/12/1999   Nov 20, 2000 Entered By: Alycia Patten Comment: ltFeb 23, 2006 Entered By: Alycia Patten Comment: rt 11/05   Peripheral neuropathy 06/19/2016   PONV (postoperative nausea and vomiting)    Preventative health care 11/24/2015   Right groin pain 11/29/2012   Right inguinal hernia 05/13/2009   Qualifier: Diagnosis of  By: Burnett Kanaris     RLS (restless legs syndrome) 02/18/2015   Rosacea    Rotator cuff tear 07/27/2013   Situational depression 03/02/2021   Sleep apnea with use of continuous positive airway pressure (CPAP) 07/15/2013   CPAP set to  7 cm water,  Residual AHi was 7.8  With no leak.  User time 6 hours.  479 days , not used only 12 days - highly compliant 06-23-13  .    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   Urinary retention 11/26/2020   Ventral hernia 07/08/2012   Vertigo 07/08/2021   Weight loss 03/03/2020    Past Surgical History:  Procedure Laterality Date   ABDOMINAL HERNIA REPAIR  07/15/12   Dr [name]   ANTERIOR CERVICAL DECOMP/DISCECTOMY FUSION N/A 07/09/2020   Procedure: ANTERIOR CERVICAL DECOMPRESSION AND FUSION CERVICAL THREE-FOUR.;  Surgeon: [name], MD;  Location: [facility];  Service: Neurosurgery;  Laterality: N/A;  anterior   BROW LIFT  05/07/01   CYSTOSCOPY WITH URETEROSCOPY AND STENT PLACEMENT Right 01/28/2014   Procedure: CYSTOSCOPY WITH URETEROSCOPY, BASKET RETRIVAL AND  STENT PLACEMENT;  Surgeon: [name], MD;  Location: [facility];  Service:  Urology;  Laterality: Right;   epidural injections     multiple procedures   ESOPHAGEAL DILATION  1993 and 1994   multiple times   EYE SURGERY Bilateral 03/25/10, 2012   cataract removal   HEMORRHOID SURGERY  2014   HIATAL HERNIA REPAIR  12/21/93   HOLMIUM LASER APPLICATION Right 2/72/5366   Procedure: HOLMIUM LASER APPLICATION;  Surgeon: [name], MD;  Location: [facility];  Service: Urology;  Laterality: Right;   INGUINAL HERNIA REPAIR Right 07/15/12   Dr [name], x2   JOINT REPLACEMENT  07/25/04   right knee   JOINT REPLACEMENT  07/13/10   left knee   [place]  09/20/06   with intraoperative cholangiogram and right inguinal herniorrhaphy with mesh    LIGAMENT REPAIR Left 07/2013   shoulder   LITHOTRIPSY  x2   NASAL SEPTUM SURGERY  1975   POSTERIOR CERVICAL FUSION/FORAMINOTOMY N/A 11/24/2020   Procedure: Posterior Cervical Laminectomy Cervical three-four, Cervical four-five with lateral mass fusion;  Surgeon: [name], MD;  Location: [facility];  Service: Neurosurgery;  Laterality: N/A;   REFRACTIVE SURGERY Bilateral    Right total hip replacement  4/14   SKIN CANCER DESTRUCTION     nose, ear   TONSILLECTOMY  as child   TOTAL HIP ARTHROPLASTY Left    URETHRAL DILATION  1991   [name]    There were no vitals filed for this visit.   Subjective Assessment - 11/03/21 1103     Subjective Pt states he is still waiting for the results of his hip x-ray. He reports increased R LE numbness. Has noted increased time standing and walking trying to fix an issue with his phone.    Pertinent History spondylitic myelopathy, ACDF, HTN, COPD, cervical decompression, sleep apnea    Patient Stated Goals "get a lot better."                [facility] PT Assessment - 11/03/21 0001       Assessment   Medical Diagnosis R26.9 (ICD-10-CM) - Gait disorder  R42 (ICD-10-CM) - Vertigo    Referring Provider (PT) [name], NP    Onset  Date/Surgical Date 08/27/21    Hand Dominance Right                           [facility] Adult PT Treatment/Exercise - 11/03/21 0001       Knee/Hip Exercises: Stretches   Passive Hamstring Stretch Right;Left;30 seconds    Piriformis Stretch Right;Left;1 rep;30 seconds      Knee/Hip Exercises: Aerobic   Nustep L6 x 5 min UEs/LEs      Knee/Hip Exercises: Standing   Heel Raises Both;20 reps    Other Standing Knee Exercises forward rocking on step x10    Other Standing Knee Exercises Mini squat 2x10                 Balance Exercises - 11/03/21 0001       Balance Exercises: Standing   Standing Eyes Opened Head turns;Solid surface;Narrow base of support (BOS)    Standing Eyes Opened Limitations 2x10 head nods & head turns each way first on solid surface then on foam    Standing Eyes Closed Narrow base of support (BOS);20 secs;2 reps;Solid surface    SLS with Vectors Solid surface   forward tapping on 6" step x10   Standing, One Foot on a Step Eyes open;6 inch   with weighted 1000 gram ball: forward/backward x10 each leg; diagonal chops x10 with each leg                 PT Short Term Goals - 10/20/21 1157       PT SHORT TERM GOAL #1   Title Pt. will complete Berg.    Baseline 42/56    Time 2    Period Weeks    Status Achieved    Target Date 10/19/21               PT Long Term Goals - 10/20/21 1240       PT LONG TERM GOAL #1   Title The patient will be indep with progressed HEP for gait and balance.    Time 8    Period Weeks    Status On-going  Target Date 11/30/21      PT LONG TERM GOAL #2   Title The patient will improve Berg balance scale to 45/56 to decrease risk of falls.    Baseline 42/56    Time 8    Period Weeks    Status On-going    Target Date 11/30/21      PT LONG TERM GOAL #3   Title Patient will demonstrate improved functional strength by completeing 5x STS in < 16 seconds.    Baseline 10/05/21- 21 seconds.     Time 8    Period Weeks    Status On-going    Target Date 11/30/21      PT LONG TERM GOAL #4   Title Pt will maintain TUG at 14.5 seconds to demo maintained mobility    Baseline 10/05/21- 18.75 seconds without cane    Time 8    Period Weeks    Status On-going    Target Date 11/30/21      PT LONG TERM GOAL #5   Title Pt will demo no s/s of BPPV in all positions of canalith testing    Time 8    Period Weeks    Status Achieved    Target Date 11/30/21                   Plan - 11/03/21 1144     Clinical Impression Statement Session focused on progressing pt's balance on and off foam. Working on tandem walking to reduce his base of support while ambulating for more efficient gait pattern. Continued core and trunk strengthening in standing.    Personal Factors and Comorbidities Comorbidity 3+;Time since onset of injury/illness/exacerbation    Comorbidities HTN, COPD, spondylitis myelopathy, falls    Examination-Activity Limitations Squat;Stairs;Locomotion Level;Bed Mobility;Bend    Examination-Participation Restrictions [employer] Activity    Stability/Clinical Decision Making Evolving/Moderate complexity    Rehab Potential Good    PT Frequency 2x / week    PT Duration 8 weeks    PT Treatment/Interventions ADLs/Self Care Home Management;Patient/family education;Balance training;Neuromuscular re-education;Gait training;Therapeutic activities;Therapeutic exercise;Functional mobility training;Taping;Manual techniques;Vestibular;Canalith Repostioning    PT Next Visit Plan Berg activities; gait; balance.    PT Home Exercise Plan [email or web address] (from previous episode)    Consulted and Agree with Plan of Care Patient             Patient will benefit from skilled therapeutic intervention in order to improve the following deficits and impairments:  Pain, Decreased activity tolerance, Decreased balance, Decreased strength, Decreased range of motion, Impaired flexibility, Impaired  tone, Increased fascial restricitons, Dizziness, Abnormal gait, Increased edema  Visit Diagnosis: Muscle weakness (generalized)  Unsteadiness on feet  Other abnormalities of gait and mobility     Problem List Patient Active Problem List   Diagnosis Date Noted   Benign paroxysmal positional vertigo 09/14/2021   Palpitations 07/25/2021   Cardiac murmur 07/25/2021   History of chicken pox 07/22/2021   History of hemorrhoids 07/22/2021   Hypertension 07/22/2021   COPD (chronic obstructive pulmonary disease) ([place]) 07/22/2021   History of kidney stones 07/22/2021   Cancer ([place]) 97/10/6376   Complication of anesthesia 07/22/2021   PONV (postoperative nausea and vomiting) 07/22/2021   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[place]) 07/22/2021   Other abnormal glucose 07/22/2021   Spondylitis ([place]) 07/22/2021   Greater trochanteric pain syndrome 07/22/2021   Vertigo 07/08/2021   Hypokalemia 07/08/2021   Trochanteric bursitis of left hip 06/30/2021   C. difficile colitis 04/11/2021   Lumbar radiculopathy 03/02/2021  Gait disorder 03/02/2021   Hip pain 03/02/2021   Situational depression 03/02/2021   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[place]) 02/09/2021   Urinary retention 11/26/2020   Cervical myelopathy (HCC) 11/24/2020   Body mass index (BMI) 25.0-25.9, adult 11/02/2020   Lumbar spondylosis 11/02/2020   Lumbago of lumbar region with sciatica 10/21/2020   Cystoid macular edema of both eyes 07/28/2020   Status post cervical spinal fusion 07/09/2020   History of total bilateral knee replacement 07/08/2020   Aortic atherosclerosis ([place]) 03/05/2020   Emphysema, unspecified ([place]) 03/05/2020   Arthritis 03/04/2020   Diverticulosis 03/03/2020   Internal hemorrhoids 03/03/2020   Weight loss 03/03/2020   Heme positive stool 02/29/2020   Leg cramps 11/13/2019   Tibialis anterior tendon tear, nontraumatic 11/13/2019   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 07/29/2018   CPAP (continuous positive airway pressure) dependence 03/25/2018   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   Erectile dysfunction 06/20/2017   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 03/06/2017   Peripheral neuropathy 06/19/2016   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   Hearing loss 02/02/2016   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   Allergic rhinitis 02/02/2016   Atypical chest pain 11/24/2015   Preventative health care 11/24/2015   Onychomycosis 06/30/2015   Degenerative disc disease, lumbar 06/30/2015   Other allergic rhinitis 02/18/2015   Deviated nasal septum 02/18/2015   RLS (restless legs syndrome) 02/18/2015   GERD (gastroesophageal reflux disease) 09/18/2014   Hyperglycemia 03/03/2014   Rotator cuff tear 07/27/2013   Sleep apnea with use of continuous positive airway pressure (CPAP) 07/15/2013   Carotid stenosis 02/24/2013   Nonspecific abnormal electrocardiogram (ECG) (EKG) 01/06/2013   DJD (degenerative joint disease) of hip 12/24/2012   Lower GI bleed 12/12/2012   Right groin pain 11/29/2012   Ventral hernia 07/08/2012   High risk medication use 01/18/2012   Dyspnea 06/12/2011   Hyperlipidemia with target LDL less than 130 12/27/2010   Osteoarthrosis, unspecified whether generalized or localized, pelvic region and thigh 07/25/2010   Osteoarthrosis, hand 06/13/2010   Anemia 12/17/2009   Hypothyroidism 05/13/2009   Hyperlipidemia 05/13/2009   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   Benign essential hypertension 05/13/2009   Right inguinal hernia 05/13/2009   HIATAL HERNIA 05/13/2009   NEPHROLITHIASIS 05/13/2009   ROSACEA 05/13/2009   Other ill-defined and unknown causes of morbidity and mortality 09/12/1999    [facility] April Gordy Levan, PT, DPT 11/03/2021, 11:46 AM  [facility] [place], [place], [address] Phone: [phone]   Fax:   [phone]  Name: [name] Pratt MRN: [id number] Date of Birth: [birth year]

## 2021-11-08 ENCOUNTER — Encounter: Payer: Medicare HMO | Admitting: Physical Therapy

## 2021-11-08 DIAGNOSIS — E782 Mixed hyperlipidemia: Secondary | ICD-10-CM

## 2021-11-08 DIAGNOSIS — I10 Essential (primary) hypertension: Secondary | ICD-10-CM | POA: Diagnosis not present

## 2021-11-08 DIAGNOSIS — C44319 Basal cell carcinoma of skin of other parts of face: Secondary | ICD-10-CM | POA: Diagnosis not present

## 2021-11-10 ENCOUNTER — Ambulatory Visit: Payer: Medicare HMO | Attending: Family | Admitting: Physical Therapy

## 2021-11-10 ENCOUNTER — Other Ambulatory Visit: Payer: Self-pay

## 2021-11-10 DIAGNOSIS — R42 Dizziness and giddiness: Secondary | ICD-10-CM | POA: Diagnosis not present

## 2021-11-10 DIAGNOSIS — R2689 Other abnormalities of gait and mobility: Secondary | ICD-10-CM | POA: Diagnosis not present

## 2021-11-10 DIAGNOSIS — H8111 Benign paroxysmal vertigo, right ear: Secondary | ICD-10-CM | POA: Diagnosis not present

## 2021-11-10 DIAGNOSIS — R2681 Unsteadiness on feet: Secondary | ICD-10-CM | POA: Diagnosis not present

## 2021-11-10 DIAGNOSIS — R29818 Other symptoms and signs involving the nervous system: Secondary | ICD-10-CM | POA: Insufficient documentation

## 2021-11-10 DIAGNOSIS — M6281 Muscle weakness (generalized): Secondary | ICD-10-CM | POA: Diagnosis not present

## 2021-11-10 NOTE — Therapy (Signed)
[facility] [address] [place], [place], [address] Phone: [phone]   Fax:  [phone]  Physical Therapy Treatment and 10th Visit Progress Note  Patient Details  Name: [name] Pratt MRN: [id number] Date of Birth: 05/03/1941 Referring Provider (PT): [name], NP  Progress Note Reporting Period 10/05/21 to 11/10/2021  See note below for Objective Data and Assessment of Progress/Goals.      Encounter Date: 11/10/2021   PT End of Session - 11/10/21 1105     Visit Number 9    Number of Visits 16    Date for PT Re-Evaluation 11/30/21    Authorization Type [name]    Authorization - Visit Number 9    Progress Note Due on Visit 10    PT Start Time 1105    PT Stop Time 1145    PT Time Calculation (min) 40 min    Equipment Utilized During Treatment Gait belt    Activity Tolerance Patient tolerated treatment well    Behavior During Therapy WFL for tasks assessed/performed             Past Medical History:  Diagnosis Date   Allergic rhinitis 02/02/2016   Anemia 12/17/2009   Formatting of this note might be different from the original. 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 Hypertension   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HCC) 07/22/2021   Body mass index (BMI) 25.0-25.9, adult 11/02/2020   C. difficile colitis 04/11/2021   Cancer (HCC)    skin cancer   Carotid stenosis    a. Carotid U/S 5/13: LICA < 50%, RICA 50-69%;  b.  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   Cystoid macular edema of both eyes 07/28/2020   Degenerative disc disease, lumbar 06/30/2015   Deviated nasal septum 02/18/2015   Diverticulosis 03/03/2020   DJD (degenerative joint disease) of hip 12/24/2012   Dyspnea 06/12/2011   Emphysema, unspecified (HCC) 03/05/2020   Erectile dysfunction 06/20/2017   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   Gait disorder 03/02/2021   GERD (gastroesophageal reflux disease)    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   Qualifier: Diagnosis of  By: Kem Parkinson     Greater trochanteric pain syndrome 07/22/2021   Hearing loss 02/02/2016   Heme positive stool 02/29/2020   HIATAL HERNIA 05/13/2009   Qualifier: Diagnosis of  By: Kem Parkinson     High risk medication use 01/18/2012   Hip pain 03/02/2021   History of chicken pox    History of hemorrhoids    History of kidney stones    History of total bilateral knee replacement 07/08/2020   Hyperglycemia 03/03/2014   Hyperlipidemia    under control   Hyperlipidemia with target LDL less than 130 12/27/2010   Formatting of this note might be different from the original. Cardiology - Dr Estrella Myrtle at [facility] cardiology  ICD-10 cut over   Hypertension    under control   Hypokalemia 07/08/2021   Hypothyroidism    Ileus (HCC)    Internal hemorrhoids 03/03/2020   Leg cramps 11/13/2019   Lower GI bleed 12/12/2012   Lumbago of lumbar region with sciatica 10/21/2020   Lumbar radiculopathy 03/02/2021   Lumbar spondylosis 11/02/2020   Nasal congestion 04/11/2021   NEPHROLITHIASIS 05/13/2009   Qualifier: Diagnosis of  By: Kem Parkinson     Nonspecific abnormal electrocardiogram (ECG) (EKG) 01/06/2013   Onychomycosis 06/30/2015   Osteoarthrosis, 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  Osteoarthritis Of The Hand  10/1 IMO update   Osteoarthrosis, unspecified whether generalized or localized, pelvic region and thigh 07/25/2010   Formatting of this note might be different from the original. Osteoarthritis Of The Hip   Other abnormal glucose 07/22/2021   Other allergic rhinitis 02/18/2015   Other ill-defined and unknown causes of morbidity and mortality 09/12/1999   Nov 20, 2000 Entered By: Lexine Baton Comment: ltFeb 23, 2006 Entered By: Lexine Baton Comment: rt 11/05   Peripheral neuropathy 06/19/2016   PONV (postoperative nausea and vomiting)    Preventative health care 11/24/2015   Right groin pain 11/29/2012   Right inguinal hernia 05/13/2009   Qualifier: Diagnosis of  By: Kem Parkinson     RLS (restless legs syndrome) 02/18/2015   Rosacea    Rotator cuff tear 07/27/2013   Situational depression 03/02/2021   Sleep apnea with use of continuous positive airway pressure (CPAP) 07/15/2013   CPAP set to  7 cm water,  Residual AHi was 7.8  With no leak.  User time 6 hours.  479 days , not used only 12 days - highly compliant 06-23-13  .    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 Fawn Kirk   Urinary retention 11/26/2020   Ventral hernia 07/08/2012   Vertigo 07/08/2021   Weight loss 03/03/2020    Past Surgical History:  Procedure Laterality Date   ABDOMINAL HERNIA REPAIR  07/15/12   Dr Ashley Jacobs   ANTERIOR CERVICAL DECOMP/DISCECTOMY FUSION N/A 07/09/2020   Procedure: ANTERIOR CERVICAL DECOMPRESSION AND FUSION CERVICAL THREE-FOUR.;  Surgeon: Donalee Citrin, MD;  Location: [facility] OR;  Service: Neurosurgery;  Laterality: N/A;  anterior   BROW LIFT  05/07/01   CYSTOSCOPY WITH URETEROSCOPY AND STENT PLACEMENT  Right 01/28/2014   Procedure: CYSTOSCOPY WITH URETEROSCOPY, BASKET RETRIVAL AND  STENT PLACEMENT;  Surgeon: Valetta Fuller, MD;  Location: [facility] ORS;  Service: Urology;  Laterality: Right;   epidural injections     multiple procedures   ESOPHAGEAL DILATION  1993 and 1994   multiple times   EYE SURGERY Bilateral 03/25/10, 2012   cataract removal   HEMORRHOID SURGERY  2014   HIATAL HERNIA REPAIR  12/21/93   HOLMIUM LASER APPLICATION Right 01/28/2014   Procedure: HOLMIUM LASER APPLICATION;  Surgeon: Valetta Fuller, MD;  Location: [facility];  Service: Urology;  Laterality: Right;   INGUINAL HERNIA REPAIR Right 07/15/12   Dr Ashley Jacobs, x2   JOINT REPLACEMENT  07/25/04   right knee   JOINT REPLACEMENT  07/13/10   left knee   KNEE SURGERY  1995   LAPAROSCOPIC CHOLECYSTECTOMY  09/20/06  with intraoperative cholangiogram and right inguinal herniorrhaphy with mesh    LIGAMENT REPAIR Left 07/2013   shoulder   LITHOTRIPSY     x2   NASAL SEPTUM SURGERY  1975   POSTERIOR CERVICAL FUSION/FORAMINOTOMY N/A 11/24/2020   Procedure: Posterior Cervical Laminectomy Cervical three-four, Cervical four-five with lateral mass fusion;  Surgeon: Donalee Citrin, MD;  Location: [facility] OR;  Service: Neurosurgery;  Laterality: N/A;   REFRACTIVE SURGERY Bilateral    Right total hip replacement  4/14   SKIN CANCER DESTRUCTION     nose, ear   TONSILLECTOMY  as child   TOTAL HIP ARTHROPLASTY Left    URETHRAL DILATION  1991   Dr. Annabell Howells    There were no vitals filed for this visit.   Subjective Assessment - 11/10/21 1105     Subjective Dizziness remains well controlled. Pt had cancer removed from forehead -- MD recommends zero to light physical activity in the first 2 days. Pt states he is feeling well today. Hip and back aren't hurting.    Pertinent History spondylitic myelopathy, ACDF, HTN, COPD, cervical decompression, sleep apnea    Patient Stated Goals "get a lot better."    Currently in Pain? No/denies                 [facility] PT Assessment - 11/10/21 0001       Assessment   Medical Diagnosis R26.9 (ICD-10-CM) - Gait disorder  R42 (ICD-10-CM) - Vertigo    Referring Provider (PT) [name], NP    Onset Date/Surgical Date 08/27/21    Hand Dominance Right      Berg Balance Test   Sit to Stand Able to stand without using hands and stabilize independently    Standing Unsupported Able to stand safely 2 minutes    Sitting with Back Unsupported but Feet Supported on Floor or Stool Able to sit safely and securely 2 minutes    Stand to Sit Sits safely with minimal use of hands    Transfers Able to transfer safely, minor use of hands    Standing Unsupported with Eyes Closed Able to stand 10 seconds safely    Standing Unsupported with Feet Together Able to place feet together independently and stand 1 minute safely    From Standing, Reach Forward with Outstretched Arm Can reach confidently >25 cm (10")    From Standing Position, Pick up Object from Floor Able to pick up shoe safely and easily    From Standing Position, Turn to Look Behind Over each Shoulder Looks behind from both sides and weight shifts well    Turn 360 Degrees Able to turn 360 degrees safely but slowly    Standing Unsupported, Alternately Place Feet on Step/Stool Able to stand independently and complete 8 steps >20 seconds    Standing Unsupported, One Foot in Front Able to plae foot ahead of the other independently and hold 30 seconds   Able to tolerate tandem x30 sec with L LE backward; only able to perform tandem x20 sec with R LE backward. Capable of 30 sec hold with placing one foot ahead of the other bilat   Standing on One Leg Able to lift leg independently and hold equal to or more than 3 seconds    Total Score 50      Timed Up and Go Test   TUG Normal TUG    TUG Comments 19 sec with cane; 17 sec without cane  [facility] Adult PT Treatment/Exercise - 11/10/21 0001       Ambulation/Gait    Ambulation Distance (Feet) 240 Feet    Assistive device Straight cane    Gait Pattern Step-through pattern;Decreased dorsiflexion - right;Decreased dorsiflexion - left;Trendelenburg;Wide base of support;Lateral trunk lean to right    Gait Comments in long hallway per pt request                       PT Short Term Goals - 10/20/21 1157       PT SHORT TERM GOAL #1   Title Pt. will complete Berg.    Baseline 42/56    Time 2    Period Weeks    Status Achieved    Target Date 10/19/21               PT Long Term Goals - 11/10/21 1138       PT LONG TERM GOAL #1   Title The patient will be indep with progressed HEP for gait and balance.    Time 8    Period Weeks    Status On-going    Target Date 11/30/21      PT LONG TERM GOAL #2   Title The patient will improve Berg balance scale to 45/56 to decrease risk of falls.    Baseline 42/56 initial eval. 50/56 on 11/10/21    Time 8    Period Weeks    Status Achieved    Target Date 11/30/21      PT LONG TERM GOAL #3   Title Patient will demonstrate improved functional strength by completeing 5x STS in < 16 seconds.    Baseline 10/05/21- 21 seconds.    Time 8    Period Weeks    Status On-going    Target Date 11/30/21      PT LONG TERM GOAL #4   Title Pt will maintain TUG at 14.5 seconds to demo maintained mobility    Baseline 10/05/21- 18.75 seconds without cane. 11/10/21 - 17 sec without a/d    Time 8    Period Weeks    Status On-going    Target Date 11/30/21      PT LONG TERM GOAL #5   Title Pt will demo no s/s of BPPV in all positions of canalith testing    Time 8    Period Weeks    Status Achieved    Target Date 11/30/21                   Plan - 11/10/21 1135     Clinical Impression Statement Pt with recent cancer removal from forehead 2 days ago. Pt with activity precautions from his MD for "zero to light physical activity." Session thus focused primarily on reassessing pt's goals. Pt with  decreased back and hip pain this session and able to demo improved Berg Balance score to 50/56 (he has met this LTG). TUG time decreased by 2 sec from initial eval; however, he has been able to reach 14.6 sec on a prior session. Pt is demonstrating gains towards his goals and would benefit from continued therapy to reach his max potential. Dizziness remains well controlled.    Personal Factors and Comorbidities Comorbidity 3+;Time since onset of injury/illness/exacerbation    Comorbidities HTN, COPD, spondylitis myelopathy, falls    Examination-Activity Limitations Squat;Stairs;Locomotion Level;Bed Mobility;Bend    Examination-Participation Restrictions [employer] Activity    Stability/Clinical Decision Making Evolving/Moderate complexity    Rehab  Potential Good    PT Frequency 2x / week    PT Duration 8 weeks    PT Treatment/Interventions ADLs/Self Care Home Management;Patient/family education;Balance training;Neuromuscular re-education;Gait training;Therapeutic activities;Therapeutic exercise;Functional mobility training;Taping;Manual techniques;Vestibular;Canalith Repostioning    PT Next Visit Plan Berg activities; gait; balance.    PT Home Exercise Plan [id number] (from previous episode)    Consulted and Agree with Plan of Care Patient             Patient will benefit from skilled therapeutic intervention in order to improve the following deficits and impairments:  Pain, Decreased activity tolerance, Decreased balance, Decreased strength, Decreased range of motion, Impaired flexibility, Impaired tone, Increased fascial restricitons, Dizziness, Abnormal gait, Increased edema  Visit Diagnosis: Muscle weakness (generalized)  Unsteadiness on feet  Other abnormalities of gait and mobility  Dizziness and giddiness     Problem List Patient Active Problem List   Diagnosis Date Noted   Benign paroxysmal positional vertigo 09/14/2021   Palpitations 07/25/2021   Cardiac murmur  07/25/2021   History of chicken pox 07/22/2021   History of hemorrhoids 07/22/2021   Hypertension 07/22/2021   COPD (chronic obstructive pulmonary disease) (HCC) 07/22/2021   History of kidney stones 07/22/2021   Cancer (HCC) 07/22/2021   Complication of anesthesia 07/22/2021   PONV (postoperative nausea and vomiting) 07/22/2021   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HCC) 07/22/2021   Other abnormal glucose 07/22/2021   Spondylitis (HCC) 07/22/2021   Greater trochanteric pain syndrome 07/22/2021   Vertigo 07/08/2021   Hypokalemia 07/08/2021   Trochanteric bursitis of left hip 06/30/2021   C. 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   Urinary retention 11/26/2020   Cervical myelopathy (HCC) 11/24/2020   Body mass index (BMI) 25.0-25.9, adult 11/02/2020   Lumbar spondylosis 11/02/2020   Lumbago of lumbar region with sciatica 10/21/2020   Cystoid macular edema of both eyes 07/28/2020   Status post cervical spinal fusion 07/09/2020   History of total bilateral knee replacement 07/08/2020   Aortic atherosclerosis (HCC) 03/05/2020   Emphysema, unspecified (HCC) 03/05/2020   Arthritis 03/04/2020   Diverticulosis 03/03/2020   Internal hemorrhoids 03/03/2020   Weight loss 03/03/2020   Heme positive stool 02/29/2020   Leg cramps 11/13/2019   Tibialis anterior tendon tear, nontraumatic 11/13/2019   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   CPAP (continuous positive airway pressure) dependence 03/25/2018   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   Erectile dysfunction 06/20/2017   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 03/06/2017   Peripheral neuropathy 06/19/2016   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   Hearing loss  02/02/2016   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   Allergic rhinitis 02/02/2016   Atypical chest pain 11/24/2015   Preventative health care 11/24/2015   Onychomycosis 06/30/2015   Degenerative disc disease, lumbar 06/30/2015   Other allergic rhinitis 02/18/2015   Deviated nasal septum 02/18/2015   RLS (restless legs syndrome) 02/18/2015   GERD (gastroesophageal reflux disease) 09/18/2014   Hyperglycemia 03/03/2014   Rotator cuff tear 07/27/2013   Sleep apnea with use of continuous positive airway pressure (CPAP) 07/15/2013   Carotid stenosis 02/24/2013   Nonspecific abnormal electrocardiogram (ECG) (EKG) 01/06/2013   DJD (degenerative joint disease) of hip 12/24/2012   Lower GI bleed 12/12/2012   Right groin pain 11/29/2012   Ventral hernia  07/08/2012   High risk medication use 01/18/2012   Dyspnea 06/12/2011   Hyperlipidemia with target LDL less than 130 12/27/2010   Osteoarthrosis, unspecified whether generalized or localized, pelvic region and thigh 07/25/2010   Osteoarthrosis, hand 06/13/2010   Anemia 12/17/2009   Hypothyroidism 05/13/2009   Hyperlipidemia 05/13/2009   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   Benign essential hypertension 05/13/2009   Right inguinal hernia 05/13/2009   HIATAL HERNIA 05/13/2009   NEPHROLITHIASIS 05/13/2009   ROSACEA 05/13/2009   Other ill-defined and unknown causes of morbidity and mortality 09/12/1999    [facility] April Ma L Dianca Owensby, PT, DPT 11/10/2021, 11:40 AM  [facility] [address] [place], [place], [address] Phone: [phone]   Fax:  [phone]  Name: [name] Pratt MRN: [id number] Date of Birth: 08-13-1941

## 2021-11-15 DIAGNOSIS — Z6826 Body mass index (BMI) 26.0-26.9, adult: Secondary | ICD-10-CM | POA: Diagnosis not present

## 2021-11-15 DIAGNOSIS — L905 Scar conditions and fibrosis of skin: Secondary | ICD-10-CM | POA: Diagnosis not present

## 2021-11-15 DIAGNOSIS — M5416 Radiculopathy, lumbar region: Secondary | ICD-10-CM | POA: Diagnosis not present

## 2021-11-16 ENCOUNTER — Other Ambulatory Visit: Payer: Self-pay

## 2021-11-16 ENCOUNTER — Ambulatory Visit: Payer: Medicare HMO | Admitting: Physical Therapy

## 2021-11-16 DIAGNOSIS — H8111 Benign paroxysmal vertigo, right ear: Secondary | ICD-10-CM | POA: Diagnosis not present

## 2021-11-16 DIAGNOSIS — R2681 Unsteadiness on feet: Secondary | ICD-10-CM | POA: Diagnosis not present

## 2021-11-16 DIAGNOSIS — R29818 Other symptoms and signs involving the nervous system: Secondary | ICD-10-CM | POA: Diagnosis not present

## 2021-11-16 DIAGNOSIS — R2689 Other abnormalities of gait and mobility: Secondary | ICD-10-CM | POA: Diagnosis not present

## 2021-11-16 DIAGNOSIS — M6281 Muscle weakness (generalized): Secondary | ICD-10-CM

## 2021-11-16 DIAGNOSIS — R42 Dizziness and giddiness: Secondary | ICD-10-CM | POA: Diagnosis not present

## 2021-11-16 NOTE — Therapy (Signed)
[place] [address] [place], [place], [address] Phone: [phone]   Fax:  [phone]  Physical Therapy Treatment  Patient Details  Name: MONTGOMERY FAVOR MRN: [id number] Date of Birth: 07-31-1941 Referring Provider (PT): [name], NP   Encounter Date: 11/16/2021   PT End of Session - 11/16/21 1144     Visit Number 10    Number of Visits 16    Date for PT Re-Evaluation 11/30/21    Authorization Type [name]    Authorization - Visit Number 10    Progress Note Due on Visit 10    PT Start Time 1607    PT Stop Time 1230    PT Time Calculation (min) 45 min    Equipment Utilized During Treatment Gait belt    Activity Tolerance Patient tolerated treatment well    Behavior During Therapy [facility] for tasks assessed/performed             Past Medical History:  Diagnosis Date   Allergic rhinitis 02/02/2016   Anemia 12/17/2009   Formatting of this note might be different from the original. Anemia  10/1 IMO update   Aortic atherosclerosis ([place]) 03/05/2020   Arthritis    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   Atypical chest pain 11/24/2015   Benign essential hypertension 05/13/2009   Qualifier: Diagnosis of  By: Larwance Sachs of this note might be different from the original. Hypertension   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[place]) 07/22/2021   Body mass index (BMI) 25.0-25.9, adult 11/02/2020   C. difficile colitis 04/11/2021   Cancer ([place])    skin cancer   Carotid stenosis    a. Carotid U/S 3/71: LICA < 06%, RICA 26-94%;  b.  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   Cystoid  macular edema of both eyes 07/28/2020   Degenerative disc disease, lumbar 06/30/2015   Deviated nasal septum 02/18/2015   Diverticulosis 03/03/2020   DJD (degenerative joint disease) of hip 12/24/2012   Dyspnea 06/12/2011   Emphysema, unspecified ([place]) 03/05/2020   Erectile dysfunction 06/20/2017   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[place]) 02/09/2021   Gait disorder 03/02/2021   GERD (gastroesophageal reflux disease)    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   Qualifier: Diagnosis of  By: Burnett Kanaris     Greater trochanteric pain syndrome 07/22/2021   Hearing loss 02/02/2016   Heme positive stool 02/29/2020   HIATAL HERNIA 05/13/2009   Qualifier: Diagnosis of  By: Burnett Kanaris     High risk medication use 01/18/2012   Hip pain 03/02/2021   History of chicken pox    History of hemorrhoids    History of kidney stones    History of total bilateral knee replacement 07/08/2020   Hyperglycemia 03/03/2014   Hyperlipidemia    under control   Hyperlipidemia with target LDL less than 130 12/27/2010   Formatting of this note might be different from the original. Cardiology - Dr Frances Nickels at [facility] cardiology  ICD-10 cut over   Hypertension    under control   Hypokalemia 07/08/2021   Hypothyroidism    Ileus ([place])  Internal hemorrhoids 03/03/2020   Leg cramps 11/13/2019   Lower GI bleed 12/12/2012   Lumbago of lumbar region with sciatica 10/21/2020   Lumbar radiculopathy 03/02/2021   Lumbar spondylosis 11/02/2020   Nasal congestion 04/11/2021   NEPHROLITHIASIS 05/13/2009   Qualifier: Diagnosis of  By: Burnett Kanaris     Nonspecific abnormal electrocardiogram (ECG) (EKG) 01/06/2013   Onychomycosis 06/30/2015   Osteoarthrosis, 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 Osteoarthritis Of The Hand  10/1 IMO update   Osteoarthrosis, unspecified whether generalized or localized, pelvic region and thigh 07/25/2010   Formatting of  this note might be different from the original. Osteoarthritis Of The Hip   Other abnormal glucose 07/22/2021   Other allergic rhinitis 02/18/2015   Other ill-defined and unknown causes of morbidity and mortality 09/12/1999   Nov 20, 2000 Entered By: Alycia Patten Comment: ltFeb 23, 2006 Entered By: Alycia Patten Comment: rt 11/05   Peripheral neuropathy 06/19/2016   PONV (postoperative nausea and vomiting)    Preventative health care 11/24/2015   Right groin pain 11/29/2012   Right inguinal hernia 05/13/2009   Qualifier: Diagnosis of  By: Burnett Kanaris     RLS (restless legs syndrome) 02/18/2015   Rosacea    Rotator cuff tear 07/27/2013   Situational depression 03/02/2021   Sleep apnea with use of continuous positive airway pressure (CPAP) 07/15/2013   CPAP set to  7 cm water,  Residual AHi was 7.8  With no leak.  User time 6 hours.  479 days , not used only 12 days - highly compliant 06-23-13  .    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   Urinary retention 11/26/2020   Ventral hernia 07/08/2012   Vertigo 07/08/2021   Weight loss 03/03/2020    Past Surgical History:  Procedure Laterality Date   ABDOMINAL HERNIA REPAIR  07/15/12   Dr [name]   ANTERIOR CERVICAL DECOMP/DISCECTOMY FUSION N/A 07/09/2020   Procedure: ANTERIOR CERVICAL DECOMPRESSION AND FUSION CERVICAL THREE-FOUR.;  Surgeon: [name], MD;  Location: [facility];  Service: Neurosurgery;  Laterality: N/A;  anterior   BROW LIFT  05/07/01   CYSTOSCOPY WITH URETEROSCOPY AND STENT PLACEMENT Right 01/28/2014   Procedure: CYSTOSCOPY WITH URETEROSCOPY, BASKET RETRIVAL AND  STENT PLACEMENT;  Surgeon: [name], MD;  Location: [facility];  Service:  Urology;  Laterality: Right;   epidural injections     multiple procedures   ESOPHAGEAL DILATION  1993 and 1994   multiple times   EYE SURGERY Bilateral 03/25/10, 2012   cataract removal   HEMORRHOID SURGERY  2014   HIATAL HERNIA REPAIR  12/21/93   HOLMIUM LASER APPLICATION Right 2/72/5366   Procedure: HOLMIUM LASER APPLICATION;  Surgeon: [name], MD;  Location: [facility];  Service: Urology;  Laterality: Right;   INGUINAL HERNIA REPAIR Right 07/15/12   Dr [name], x2   JOINT REPLACEMENT  07/25/04   right knee   JOINT REPLACEMENT  07/13/10   left knee   [place]  09/20/06   with intraoperative cholangiogram and right inguinal herniorrhaphy with mesh    LIGAMENT REPAIR Left 07/2013   shoulder   LITHOTRIPSY  x2   NASAL SEPTUM SURGERY  1975   POSTERIOR CERVICAL FUSION/FORAMINOTOMY N/A 11/24/2020   Procedure: Posterior Cervical Laminectomy Cervical three-four, Cervical four-five with lateral mass fusion;  Surgeon: [name], MD;  Location: [place] [facility];  Service: Neurosurgery;  Laterality: N/A;   REFRACTIVE SURGERY Bilateral    Right total hip replacement  4/14   SKIN CANCER DESTRUCTION     nose, ear   TONSILLECTOMY  as child   TOTAL HIP ARTHROPLASTY Left    URETHRAL DILATION  1991   [name]    There were no vitals filed for this visit.   Subjective Assessment - 11/16/21 1150     Subjective Pt states they removed the bandage from his forehead. Pt notes increased stiffness and pain in his hip today.    Pertinent History spondylitic myelopathy, ACDF, HTN, COPD, cervical decompression, sleep apnea    Patient Stated Goals "get a lot better."    Currently in Pain? Yes    Pain Score 2                 [facility] PT Assessment - 11/16/21 0001       Assessment   Medical Diagnosis R26.9 (ICD-10-CM) - Gait disorder  R42 (ICD-10-CM) - Vertigo    Referring Provider (PT) [name], NP    Onset Date/Surgical Date 08/27/21    Hand  Dominance Right      Transfers   Five time sit to stand comments  14 sec                           [facility] Adult PT Treatment/Exercise - 11/16/21 0001       Ambulation/Gait   Ambulation Distance (Feet) 50 Feet    Assistive device None    Gait Comments Holding on to orange weighted ball; increased lateral trunk flexion with fatigue noted      Knee/Hip Exercises: Stretches   Hip Flexor Stretch 30 seconds    Piriformis Stretch Right;Left;30 seconds;2 reps    Other Knee/Hip Stretches bent knee fall out x30 sec      Knee/Hip Exercises: Standing   Other Standing Knee Exercises Palloff press 2x10 green tband    Other Standing Knee Exercises lateral band walk red tband 2x10      Manual Therapy   Manual therapy comments LAD to hip and inferior glide grade III                 Balance Exercises - 11/16/21 0001       Balance Exercises: Standing   Standing, One Foot on a Step Eyes open;6 inch   diagonal chops 2x10 with each leg   Other Standing Exercises side, forward and backward stepping over 1 large pool noodles (min A) 2x 10 reps.                  PT Short Term Goals - 10/20/21 1157       PT SHORT TERM GOAL #1   Title Pt. will complete Berg.    Baseline 42/56    Time 2    Period Weeks    Status Achieved    Target Date 10/19/21               PT Long Term Goals - 11/10/21 1138       PT LONG TERM GOAL #1   Title The patient will be indep with progressed HEP for gait and balance.    Time  8    Period Weeks    Status On-going    Target Date 11/30/21      PT LONG TERM GOAL #2   Title The patient will improve Berg balance scale to 45/56 to decrease risk of falls.    Baseline 42/56 initial eval. 50/56 on 11/10/21    Time 8    Period Weeks    Status Achieved    Target Date 11/30/21      PT LONG TERM GOAL #3   Title Patient will demonstrate improved functional strength by completeing 5x STS in < 16 seconds.    Baseline 10/05/21- 21  seconds.    Time 8    Period Weeks    Status On-going    Target Date 11/30/21      PT LONG TERM GOAL #4   Title Pt will maintain TUG at 14.5 seconds to demo maintained mobility    Baseline 10/05/21- 18.75 seconds without cane. 11/10/21 - 17 sec without a/d    Time 8    Period Weeks    Status On-going    Target Date 11/30/21      PT LONG TERM GOAL #5   Title Pt will demo no s/s of BPPV in all positions of canalith testing    Time 8    Period Weeks    Status Achieved    Target Date 11/30/21                   Plan - 11/16/21 1252     Clinical Impression Statement Pt reported decrease in hip pain after performing stretches. Rest of session focused on resuming the progression of his static and dynamic balance exercises. Pt has been able to meet his STS goal. Continued to work on core and hip strengthening.    Personal Factors and Comorbidities Comorbidity 3+;Time since onset of injury/illness/exacerbation    Comorbidities HTN, COPD, spondylitis myelopathy, falls    Examination-Activity Limitations Squat;Stairs;Locomotion Level;Bed Mobility;Bend    PT Treatment/Interventions ADLs/Self Care Home Management;Patient/family education;Balance training;Neuromuscular re-education;Gait training;Therapeutic activities;Therapeutic exercise;Functional mobility training;Taping;Manual techniques;Vestibular;Canalith Repostioning    PT Next Visit Plan Berg activities; gait; balance.    PT Home Exercise Plan [id number] (from previous episode)    Consulted and Agree with Plan of Care Patient             Patient will benefit from skilled therapeutic intervention in order to improve the following deficits and impairments:  Pain, Decreased activity tolerance, Decreased balance, Decreased strength, Decreased range of motion, Impaired flexibility, Impaired tone, Increased fascial restricitons, Dizziness, Abnormal gait, Increased edema  Visit Diagnosis: Muscle weakness (generalized)  Unsteadiness  on feet  Other abnormalities of gait and mobility     Problem List Patient Active Problem List   Diagnosis Date Noted   Benign paroxysmal positional vertigo 09/14/2021   Palpitations 07/25/2021   Cardiac murmur 07/25/2021   History of chicken pox 07/22/2021   History of hemorrhoids 07/22/2021   Hypertension 07/22/2021   COPD (chronic obstructive pulmonary disease) ([place]) 07/22/2021   History of kidney stones 07/22/2021   Cancer ([place]) 78/46/9629   Complication of anesthesia 07/22/2021   PONV (postoperative nausea and vomiting) 07/22/2021   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[place]) 07/22/2021   Other abnormal glucose 07/22/2021   Spondylitis ([place]) 07/22/2021   Greater trochanteric pain syndrome 07/22/2021   Vertigo 07/08/2021   Hypokalemia 07/08/2021   Trochanteric bursitis of left hip 06/30/2021   C. difficile colitis 04/11/2021   Lumbar radiculopathy 03/02/2021   Gait disorder 03/02/2021  Hip pain 03/02/2021   Situational depression 03/02/2021   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[place]) 02/09/2021   Urinary retention 11/26/2020   Cervical myelopathy (HCC) 11/24/2020   Body mass index (BMI) 25.0-25.9, adult 11/02/2020   Lumbar spondylosis 11/02/2020   Lumbago of lumbar region with sciatica 10/21/2020   Cystoid macular edema of both eyes 07/28/2020   Status post cervical spinal fusion 07/09/2020   History of total bilateral knee replacement 07/08/2020   Aortic atherosclerosis ([place]) 03/05/2020   Emphysema, unspecified ([place]) 03/05/2020   Arthritis 03/04/2020   Diverticulosis 03/03/2020   Internal hemorrhoids 03/03/2020   Weight loss 03/03/2020   Heme positive stool 02/29/2020   Leg cramps 11/13/2019   Tibialis anterior tendon tear, nontraumatic 11/13/2019   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   CPAP (continuous positive airway pressure) dependence 03/25/2018   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    Erectile dysfunction 06/20/2017   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 03/06/2017   Peripheral neuropathy 06/19/2016   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   Hearing loss 02/02/2016   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   Allergic rhinitis 02/02/2016   Atypical chest pain 11/24/2015   Preventative health care 11/24/2015   Onychomycosis 06/30/2015   Degenerative disc disease, lumbar 06/30/2015   Other allergic rhinitis 02/18/2015   Deviated nasal septum 02/18/2015   RLS (restless legs syndrome) 02/18/2015   GERD (gastroesophageal reflux disease) 09/18/2014   Hyperglycemia 03/03/2014   Rotator cuff tear 07/27/2013   Sleep apnea with use of continuous positive airway pressure (CPAP) 07/15/2013   Carotid stenosis 02/24/2013   Nonspecific abnormal electrocardiogram (ECG) (EKG) 01/06/2013   DJD (degenerative joint disease) of hip 12/24/2012   Lower GI bleed 12/12/2012   Right groin pain 11/29/2012   Ventral hernia 07/08/2012   High risk medication use 01/18/2012   Dyspnea 06/12/2011   Hyperlipidemia with target LDL less than 130 12/27/2010   Osteoarthrosis, unspecified whether generalized or localized, pelvic region and thigh 07/25/2010   Osteoarthrosis, hand 06/13/2010   Anemia 12/17/2009   Hypothyroidism 05/13/2009   Hyperlipidemia 05/13/2009   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   Benign essential hypertension 05/13/2009   Right inguinal hernia 05/13/2009   HIATAL HERNIA 05/13/2009   NEPHROLITHIASIS 05/13/2009   ROSACEA 05/13/2009   Other ill-defined and unknown causes of morbidity and mortality 09/12/1999    [facility] April Gordy Levan, PT, DPT 11/16/2021, 12:55 PM  [facility] [address] [place], [place], [address] Phone: [phone]   Fax:  [phone]  Name: GADDIEL CULLENS MRN: [id number] Date of Birth: 1941/05/31

## 2021-11-17 ENCOUNTER — Encounter (HOSPITAL_BASED_OUTPATIENT_CLINIC_OR_DEPARTMENT_OTHER): Payer: Self-pay | Admitting: Emergency Medicine

## 2021-11-17 ENCOUNTER — Emergency Department (HOSPITAL_BASED_OUTPATIENT_CLINIC_OR_DEPARTMENT_OTHER): Payer: Medicare HMO

## 2021-11-17 ENCOUNTER — Emergency Department (HOSPITAL_BASED_OUTPATIENT_CLINIC_OR_DEPARTMENT_OTHER)
Admission: EM | Admit: 2021-11-17 | Discharge: 2021-11-17 | Disposition: A | Discharge status: Home / Self Care | Payer: Medicare HMO | Attending: Emergency Medicine | Admitting: Emergency Medicine

## 2021-11-17 DIAGNOSIS — Z79899 Other long term (current) drug therapy: Secondary | ICD-10-CM | POA: Diagnosis not present

## 2021-11-17 DIAGNOSIS — I700 Atherosclerosis of aorta: Secondary | ICD-10-CM | POA: Diagnosis not present

## 2021-11-17 DIAGNOSIS — N390 Urinary tract infection, site not specified: Secondary | ICD-10-CM

## 2021-11-17 DIAGNOSIS — J449 Chronic obstructive pulmonary disease, unspecified: Secondary | ICD-10-CM | POA: Diagnosis not present

## 2021-11-17 DIAGNOSIS — I10 Essential (primary) hypertension: Secondary | ICD-10-CM | POA: Insufficient documentation

## 2021-11-17 DIAGNOSIS — Z7982 Long term (current) use of aspirin: Secondary | ICD-10-CM | POA: Diagnosis not present

## 2021-11-17 DIAGNOSIS — R059 Cough, unspecified: Secondary | ICD-10-CM | POA: Diagnosis not present

## 2021-11-17 DIAGNOSIS — U071 COVID-19: Secondary | ICD-10-CM

## 2021-11-17 DIAGNOSIS — R0602 Shortness of breath: Secondary | ICD-10-CM | POA: Diagnosis not present

## 2021-11-17 LAB — URINALYSIS, ROUTINE W REFLEX MICROSCOPIC
Bilirubin Urine: NEGATIVE
Glucose, UA: NEGATIVE mg/dL
Hgb urine dipstick: NEGATIVE
Ketones, ur: NEGATIVE mg/dL
Nitrite: NEGATIVE
Protein, ur: 100 mg/dL — AB
Specific Gravity, Urine: 1.02 (ref 1.005–1.030)
pH: 7 (ref 5.0–8.0)

## 2021-11-17 LAB — CBC WITH DIFFERENTIAL/PLATELET
Abs Immature Granulocytes: 0.02 10*3/uL (ref 0.00–0.07)
Basophils Absolute: 0.1 10*3/uL (ref 0.0–0.1)
Basophils Relative: 1 %
Eosinophils Absolute: 0.2 10*3/uL (ref 0.0–0.5)
Eosinophils Relative: 3 %
HCT: 36.8 % — ABNORMAL LOW (ref 39.0–52.0)
Hemoglobin: 12.1 g/dL — ABNORMAL LOW (ref 13.0–17.0)
Immature Granulocytes: 0 %
Lymphocytes Relative: 10 %
Lymphs Abs: 0.6 10*3/uL — ABNORMAL LOW (ref 0.7–4.0)
MCH: 32.2 pg (ref 26.0–34.0)
MCHC: 32.9 g/dL (ref 30.0–36.0)
MCV: 97.9 fL (ref 80.0–100.0)
Monocytes Absolute: 0.9 10*3/uL (ref 0.1–1.0)
Monocytes Relative: 14 %
Neutro Abs: 4.5 10*3/uL (ref 1.7–7.7)
Neutrophils Relative %: 72 %
Platelets: 318 10*3/uL (ref 150–400)
RBC: 3.76 MIL/uL — ABNORMAL LOW (ref 4.22–5.81)
RDW: 13.2 % (ref 11.5–15.5)
WBC: 6.2 10*3/uL (ref 4.0–10.5)
nRBC: 0 % (ref 0.0–0.2)

## 2021-11-17 LAB — COMPREHENSIVE METABOLIC PANEL
ALT: 22 U/L (ref 0–44)
AST: 29 U/L (ref 15–41)
Albumin: 4.2 g/dL (ref 3.5–5.0)
Alkaline Phosphatase: 76 U/L (ref 38–126)
Anion gap: 10 (ref 5–15)
BUN: 28 mg/dL — ABNORMAL HIGH (ref 8–23)
CO2: 25 mmol/L (ref 22–32)
Calcium: 9.2 mg/dL (ref 8.9–10.3)
Chloride: 106 mmol/L (ref 98–111)
Creatinine, Ser: 1.24 mg/dL (ref 0.61–1.24)
GFR, Estimated: 58 mL/min — ABNORMAL LOW (ref >60)
Glucose, Bld: 115 mg/dL — ABNORMAL HIGH (ref 70–99)
Potassium: 3.7 mmol/L (ref 3.5–5.1)
Sodium: 141 mmol/L (ref 135–145)
Total Bilirubin: 0.6 mg/dL (ref 0.3–1.2)
Total Protein: 7.9 g/dL (ref 6.5–8.1)

## 2021-11-17 LAB — URINALYSIS, MICROSCOPIC (REFLEX)

## 2021-11-17 LAB — RESP PANEL BY RT-PCR (FLU A&B, COVID) ARPGX2
Influenza A by PCR: NEGATIVE
Influenza B by PCR: NEGATIVE
SARS Coronavirus 2 by RT PCR: POSITIVE — AB

## 2021-11-17 LAB — BRAIN NATRIURETIC PEPTIDE: B Natriuretic Peptide: 170.2 pg/mL — ABNORMAL HIGH (ref 0.0–100.0)

## 2021-11-17 LAB — TROPONIN I (HIGH SENSITIVITY): Troponin I (High Sensitivity): 17 ng/L (ref <18)

## 2021-11-17 MED ORDER — CEPHALEXIN 500 MG PO CAPS: 500.0000 mg | ORAL_CAPSULE | Freq: Three times a day (TID) | ORAL | 0 refills | Status: DC

## 2021-11-17 MED ORDER — ONDANSETRON 4 MG PO TBDP
4.0000 mg | ORAL_TABLET | Freq: Once | ORAL | Status: AC
  Administered 2021-11-17: 08:00:00 4 mg via ORAL
  Filled 2021-11-17: qty 1

## 2021-11-17 MED ORDER — ONDANSETRON 4 MG PO TBDP: 4.0000 mg | ORAL_TABLET | Freq: Three times a day (TID) | ORAL | 0 refills | Status: DC | PRN

## 2021-11-17 MED ORDER — NIRMATRELVIR/RITONAVIR (PAXLOVID) TABLET (RENAL DOSING): 2.0000 | ORAL_TABLET | Freq: Two times a day (BID) | ORAL | 0 refills | Status: AC

## 2021-11-17 MED ORDER — CEPHALEXIN 250 MG PO CAPS
500.0000 mg | ORAL_CAPSULE | Freq: Once | ORAL | Status: AC
  Administered 2021-11-17: 08:00:00 500 mg via ORAL
  Filled 2021-11-17: qty 2

## 2021-11-17 NOTE — ED Notes (Signed)
Pt ambulated in room, sats remained >97% on RA ?

## 2021-11-17 NOTE — ED Triage Notes (Signed)
Pt woke up at 0300 cough and nausea and some emesis.  ?

## 2021-11-17 NOTE — Discharge Instructions (Addendum)
Do not take Crestor, Colchcine, cyclosporin drops (restasis) while taking Paxlovid. ? ?Take half of your amlodipine dose while taking Paxolovid.   ?

## 2021-11-17 NOTE — ED Notes (Signed)
Patient transported to CT 

## 2021-11-17 NOTE — ED Provider Notes (Signed)
[facility] EMERGENCY DEPARTMENT Provider Note   CSN: [id number] Arrival date & time: 11/17/21  1517     History  Chief Complaint  Patient presents with   Cough   Nausea    [name] Pratt is a 81 y.o. male.  The history is provided by the patient and medical records.  Cough [name] Pratt is a 81 y.o. male who presents to the Emergency Department complaining of sob.  He presents to the emergency department accompanied by his wife for evaluation of shortness of breath that woke him suddenly at 3 AM.  He was sleeping on CPAP when he felt like his throat was dry and he took off the CPAP.  He had nausea and feeling like he was having trouble coughing.  He was able to cough and felt a little better.  He did have 1 episode of emesis.  After that he was able to go back to sleep without the CPAP on and felt a little bit better until he had recurrent symptoms of nausea and difficulty breathing.  Cough is nonproductive.  He has been experiencing a cough for the last month.  he feels like he needs to take a deep breath to get air in.  Overall his symptoms are improving.  He did have some lower abdominal pain earlier today, overall this is improving.  No fever.  He has bilateral lower extremity edema, which she describes as an ongoing issue.  He also complains of 1 month of dysuria.  He has a history of hypertension, COPD, thyroid disease.  No new medications or recent medication changes.  No history of DVT/PE.     Home Medications Prior to Admission medications   Medication Sig Start Date End Date Taking? Authorizing Provider  nirmatrelvir/ritonavir EUA, renal dosing, (PAXLOVID) 10 x 150 MG & 10 x '100MG'$  TABS Take 2 tablets by mouth 2 (two) times daily for 5 days. 11/17/21 11/22/21 Yes Quintella Reichert, MD  ondansetron (ZOFRAN-ODT) 4 MG disintegrating tablet Take 1 tablet (4 mg total) by mouth every 8 (eight) hours as needed for nausea or vomiting. 11/17/21  Yes Quintella Reichert, MD   acetaminophen (TYLENOL) 650 MG CR tablet Take 1,300 mg by mouth every morning. And take 650 mg at bedtime    [provider]  albuterol (VENTOLIN HFA) 108 (90 Base) MCG/ACT inhaler INHALE 2 PUFFS INTO THE LUNGS EVERY 6 HOURS AS NEEDED FOR SHORTNESS OF BREATH. 10/20/21   [name], NP  Alcohol Swabs (ALCOHOL PADS) 70 % PADS 1 packet by Does not apply route 2 (two) times daily. 11/22/20   [name], NP  allopurinol (ZYLOPRIM) 100 MG tablet Take 100 mg by mouth 2 (two) times daily.    [provider]  amLODipine (NORVASC) 10 MG tablet Take 1 tablet (10 mg total) by mouth daily. 07/08/21   [name], NP  aspirin EC 81 MG tablet Take 81 mg by mouth every morning.    [provider]  Cholecalciferol (VITAMIN D) 50 MCG (2000 UT) CAPS Take 2,000 Units by mouth daily.    [provider]  colchicine 0.6 MG tablet Take 0.6 mg by mouth as needed for other. For gout    [provider]  cycloSPORINE (RESTASIS) 0.05 % ophthalmic emulsion Place 1 drop into both eyes 2 (two) times daily.    [provider]  diclofenac Sodium (VOLTAREN) 1 % GEL Apply 1 application topically daily as needed for pain (Hand pain).      fluocinonide (  LIDEX) 0.05 % external solution Apply 1 application topically 2 (two) times daily as needed for itching or pain. 06/13/21   [provider]  gabapentin (NEURONTIN) 300 MG capsule Take 300 mg by mouth 2 (two) times daily as needed. Leg and nerve pain    [provider]  GEMTESA 75 MG TABS Take 1 tablet by mouth daily. 10/20/21   [provider]  glucose blood test strip TRUE Metrix Blood Glucose Test Strip, use as instructed 11/12/20   [name], NP  Homeopathic Products ([place]) FOAM Apply 1 application topically 2 (two) times daily as needed (Pain).    [provider]  hydrALAZINE (APRESOLINE) 50 MG tablet Take 1 tablet (50 mg total) by mouth 2 (two) times  daily. 08/29/21   [name], NP  Lancets MISC 1 each by Does not apply route 2 (two) times daily. TRUE matrix lancets 11/12/20   [name], NP  levothyroxine (SYNTHROID) 75 MCG tablet TAKE 2 TABS BY MOUTH ONCE DAILY 6 DAYS A WEEK AND 1 TAB BY MOUTH ONCE DAILY ONE DAY A WEEK 10/25/21   [name], NP  losartan (COZAAR) 100 MG tablet TAKE 1 TABLET EVERY DAY 08/20/21   [name], NP  Magnesium 500 MG TABS Take by mouth. OTC    [provider]  meclizine (ANTIVERT) 25 MG tablet Take 1 tablet (25 mg total) by mouth 3 (three) times daily as needed for dizziness. 08/27/21   Deno Etienne, DO  multivitamin [facility]) per tablet Take 1 tablet by mouth daily.    [provider]  nitroGLYCERIN (NITROSTAT) 0.4 MG SL tablet Place 1 tablet (0.4 mg total) under the tongue every 5 (five) minutes as needed for chest pain. 09/16/21   [name], MD  polyethylene glycol (MIRALAX / GLYCOLAX) 17 g packet Take 17-34 g by mouth daily as needed.    [provider]  rosuvastatin (CRESTOR) 5 MG tablet TAKE 1 TABLET EVERY DAY 10/21/21   [name], NP  sodium chloride (OCEAN) 0.65 % nasal spray Place 1 spray into both nostrils at bedtime as needed for congestion.    [provider]      Allergies    Pravastatin, Sildenafil, Oxycodone, Atenolol, Elastic bandages & [zinc], and Metoclopramide hcl    Review of Systems   Review of Systems  Respiratory:  Positive for cough.   All other systems reviewed and are negative.  Physical Exam Updated Vital Signs BP (!) 145/73    Pulse 65    Temp 99.1 F (37.3 C) (Oral)    Resp 19    Ht '5\' 6"'$  (1.676 m)    Wt 72.5 kg    SpO2 96%    BMI 25.81 kg/m  Physical Exam Vitals and nursing note reviewed.  Constitutional:      Appearance: He is well-developed.  HENT:     Head: Normocephalic and atraumatic.  Cardiovascular:     Rate and Rhythm: Normal rate and regular rhythm.     Heart sounds: No murmur  heard. Pulmonary:     Effort: Pulmonary effort is normal. No respiratory distress.     Breath sounds: Normal breath sounds.  Abdominal:     Palpations: Abdomen is soft.     Tenderness: There is no abdominal tenderness. There is no guarding or rebound.  Musculoskeletal:        General: No tenderness.     Comments: 2+ DP pulses bilaterally, 2+ pitting edema to BLE  Skin:  General: Skin is warm and dry.  Neurological:     Mental Status: He is alert and oriented to person, place, and time.  Psychiatric:        Behavior: Behavior normal.    ED Results / Procedures / Treatments   Labs (all labs ordered are listed, but only abnormal results are displayed) Labs Reviewed  RESP PANEL BY RT-PCR (FLU A&B, COVID) ARPGX2 - Abnormal; Notable for the following components:      Result Value   SARS Coronavirus 2 by RT PCR POSITIVE (*)    All other components within normal limits  COMPREHENSIVE METABOLIC PANEL - Abnormal; Notable for the following components:   Glucose, Bld 115 (*)    BUN 28 (*)    GFR, Estimated 58 (*)    All other components within normal limits  CBC WITH DIFFERENTIAL/PLATELET - Abnormal; Notable for the following components:   RBC 3.76 (*)    Hemoglobin 12.1 (*)    HCT 36.8 (*)    Lymphs Abs 0.6 (*)    All other components within normal limits  BRAIN NATRIURETIC PEPTIDE - Abnormal; Notable for the following components:   B Natriuretic Peptide 170.2 (*)    All other components within normal limits  URINALYSIS, ROUTINE W REFLEX MICROSCOPIC - Abnormal; Notable for the following components:   APPearance HAZY (*)    Protein, ur 100 (*)    Leukocytes,Ua SMALL (*)    All other components within normal limits  URINALYSIS, MICROSCOPIC (REFLEX) - Abnormal; Notable for the following components:   Bacteria, UA MANY (*)    All other components within normal limits  TROPONIN I (HIGH SENSITIVITY)    EKG EKG Interpretation  Date/Time:  Thursday November 17 2021 05:24:10  EST Ventricular Rate:  74 PR Interval:  240 QRS Duration: 147 QT Interval:  449 QTC Calculation: 499 R Axis:   -61 Text Interpretation: Sinus rhythm 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 Cough. EXAM: CHEST - 2 VIEW COMPARISON:  Chest x-ray 06/21/2020. FINDINGS: Lung volumes are normal. No consolidative airspace disease. No pleural effusions. No pneumothorax. No pulmonary nodule or mass noted. Pulmonary vasculature and the cardiomediastinal silhouette are within normal limits. Atherosclerosis in the thoracic aorta. IMPRESSION: 1.  No radiographic evidence of acute cardiopulmonary disease. 2. Aortic atherosclerosis. Electronically Signed   By: Vinnie Langton M.D.   On: 11/17/2021 06:42    Procedures Procedures    Medications Ordered in ED Medications - No data to display  ED Course/ Medical Decision Making/ A&P                           Medical Decision Making Amount and/or Complexity of Data Reviewed Labs: ordered. Radiology: ordered.  Risk Prescription drug management.   Patient with history of hypertension, COPD here for evaluation of nausea, shortness of breath that woke him from sleep.  On evaluation he is in no acute distress with clear lung sounds bilaterally.  No increased work of breathing.  He is satting well on room air.  No hypoxia on ambulatory sats.  He does have lower extremity edema, but this is a chronic issue.  Doubt acute DVT.  Current clinical picture is not  consistent with decompensated CHF, BNP is decreased when compared to priors.  Chest x-ray without acute pneumonia or pulmonary edema, images personally reviewed.  EKG is unchanged from priors.  Troponin is within normal  limits.  Current presentation is not consistent with ACS.  He is positive for COVID-19 in the emergency department.  Upon review of his medications and counseling will initiate Paxlovid therapy.  He will need to hold some of his home medications.  He also does have some dysuria, UA is concerning for UTI.  We will send a culture and start antibiotics.  Counseled patient on home care for COVID as well as UTI.  Current clinical picture is not consistent with sepsis.  Discussed home care as well as close return precautions.        Final Clinical Impression(s) / ED Diagnoses Final diagnoses:  COVID-19 virus infection    Rx / DC Orders ED Discharge Orders          Ordered    nirmatrelvir/ritonavir EUA, renal dosing, (PAXLOVID) 10 x 150 MG & 10 x '100MG'$  TABS  2 times daily        11/17/21 0705    ondansetron (ZOFRAN-ODT) 4 MG disintegrating tablet  Every 8 hours PRN        11/17/21 0705              Quintella Reichert, MD 11/17/21 2258

## 2021-11-18 ENCOUNTER — Ambulatory Visit: Payer: Medicare HMO | Admitting: Physical Therapy

## 2021-11-19 LAB — URINE CULTURE: Culture: >=100000 — AB

## 2021-11-20 NOTE — Progress Notes (Signed)
ED Antimicrobial Stewardship Positive Culture Follow Up  ? ?[name] Pratt is an 81 y.o. male who presented to [facility] on 11/17/2021 with a chief complaint of  ?Chief Complaint  ?Patient presents with  ? Cough  ? Nausea  ? ? ?Recent Results (from the past 720 hour(s))  ?Resp Panel by RT-PCR (Flu A&B, Covid) Nasopharyngeal Swab     Status: Abnormal  ? Collection Time: 11/17/21  5:39 AM  ? Specimen: Nasopharyngeal Swab; Nasopharyngeal(NP) swabs in vial transport medium  ?Result Value Ref Range Status  ? SARS Coronavirus 2 by RT PCR POSITIVE (A) NEGATIVE Final  ?  Comment: (NOTE) ?SARS-CoV-2 target nucleic acids are DETECTED. ? ?The SARS-CoV-2 RNA is generally detectable in upper respiratory ?specimens during the acute phase of infection. Positive results are ?indicative of the presence of the identified virus, but do not rule ?out bacterial infection or co-infection with other pathogens not ?detected by the test. Clinical correlation with patient history and ?other diagnostic information is necessary to determine patient ?infection status. The expected result is Negative. ? ?Fact Sheet for Patients: ?EntrepreneurPulse.com.au ? ?Fact Sheet for Healthcare Providers: ?IncredibleEmployment.be ? ?This test is not yet approved or cleared by the [place] FDA and  ?has been authorized for detection and/or diagnosis of SARS-CoV-2 by ?FDA under an Emergency Use Authorization (EUA).  This EUA will ?remain in effect (meaning this test can be used) for the duration of  ?the COVID-19 declaration under Section 564(b)(1) of the A ct, 21 ?U.S.C. section 360bbb-3(b)(1), unless the authorization is ?terminated or revoked sooner. ? ?  ? Influenza A by PCR NEGATIVE NEGATIVE Final  ? Influenza B by PCR NEGATIVE NEGATIVE Final  ?  Comment: (NOTE) ?The Xpert Xpress SARS-CoV-2/FLU/RSV plus assay is intended as an aid ?in the diagnosis of influenza from Nasopharyngeal swab specimens and ?should  not be used as a sole basis for treatment. Nasal washings and ?aspirates are unacceptable for Xpert Xpress SARS-CoV-2/FLU/RSV ?testing. ? ?Fact Sheet for Patients: ?EntrepreneurPulse.com.au ? ?Fact Sheet for Healthcare Providers: ?IncredibleEmployment.be ? ?This test is not yet approved or cleared by the [place] FDA and ?has been authorized for detection and/or diagnosis of SARS-CoV-2 by ?FDA under an Emergency Use Authorization (EUA). This EUA will remain ?in effect (meaning this test can be used) for the duration of the ?COVID-19 declaration under Section 564(b)(1) of the Act, 21 U.S.C. ?section 360bbb-3(b)(1), unless the authorization is terminated or ?revoked. ? ?Performed at [facility], [address] [place] ?[place], [place] [address] ?  ?Urine Culture     Status: Abnormal  ? Collection Time: 11/17/21  6:14 AM  ? Specimen: Urine, Clean Catch  ?Result Value Ref Range Status  ? Specimen Description   Final  ?  URINE, CLEAN CATCH ?Performed at [facility], [address] [place], [place] [address] ?  ? Special Requests   Final  ?  NONE ?Performed at [facility], [address], [place],  [address] ?  ? Culture >=100,000 COLONIES/mL ENTEROCOCCUS FAECALIS (A)  Final  ? Report Status 11/19/2021 FINAL  Final  ? Organism ID, Bacteria ENTEROCOCCUS FAECALIS (A)  Final  ?    Susceptibility  ? Enterococcus faecalis - MIC*  ?  AMPICILLIN <=2 SENSITIVE Sensitive   ?  NITROFURANTOIN <=16 SENSITIVE Sensitive   ?  VANCOMYCIN 2 SENSITIVE Sensitive   ?  * >=100,000 COLONIES/mL ENTEROCOCCUS FAECALIS  ? ? ?'[x]'$  Treated with keflex, organism resistant to prescribed antimicrobial ? ?  New antibiotic prescription: Augmentin 875/125: Take  875 mg amoxicillin (1 tablet) twice daily for 10 days  (Qty 20; Refills 0) ? ?ED Provider: [name], MD ? ? ?Lorelei Pont, PharmD, BCPS ?11/20/2021 10:03 AM ?ED Clinical Pharmacist -  [phone] ? ? ?

## 2021-11-20 NOTE — Telephone Encounter (Signed)
Post ED Visit - Positive Culture Follow-up: Successful Patient Follow-Up ? ?Culture assessed and recommendations reviewed by: ? ?'[]'$  Elenor Quinones, Pharm.D. ?'[]'$  Heide Guile, Pharm.D., BCPS AQ-ID ?'[]'$  Parks Neptune, Pharm.D., BCPS ?'[]'$  Alycia Rossetti, Pharm.D., BCPS ?'[]'$  [place], Pharm.D., BCPS, AAHIVP ?'[]'$  Legrand Como, Pharm.D., BCPS, AAHIVP ?'[]'$  Salome Arnt, PharmD, BCPS ?'[]'$  Johnnette Gourd, PharmD, BCPS ?'[]'$  Hughes Better, PharmD, BCPS ?'[x]'$  Lorelei Pont PharmD ? ?Positive urine culture ? ?'[]'$  Patient discharged without antimicrobial prescription and treatment is now indicated ?'[x]'$  Organism is resistant to prescribed ED discharge antimicrobial ?'[]'$  Patient with positive blood cultures ? ?Changes discussed with ED provider: [name] ?New antibiotic prescription Augmentin '875mg'$  BID x 10 days. Qty 20 no refills ?Called to [place], [place] ? ?Contacted patient, date 11/20/21, time 1405 ? ? ?[name] Pratt ?11/20/2021, 2:09 PM ? ?  ?

## 2021-11-21 ENCOUNTER — Telehealth: Payer: Self-pay

## 2021-11-21 NOTE — Telephone Encounter (Signed)
Called patient to see if he will like to complete a virtual visit tomorrow since he has been diagnosed with covid and to go over his medications. ?Per wife patient was out to eat breakfast and will come back later. Message left with wife for patient to call and schedule virtual visit for tomorrow.  ?

## 2021-11-21 NOTE — Telephone Encounter (Signed)
[facility] Night - Client TELEPHONE ADVICE RECORD AccessNurse? ?Patient Name:[name] Pratt ?Initial Comment Caller states, has been going to the ER. Confused ?about his meds. Had symptoms. Tested positive for ?COVID. Got meds. Got bad side effects from it. ?Next day called and was advised will change one ?of then. Also has a urine infection. Not sure what ?is going on. ?Translation No ?Nurse Assessment ?Nurse: Clydene Laming, RN, Yancey Flemings Date/Time Eilene Ghazi Time): 11/20/2021 7:16:49 PM ?Confirm and document reason for call. If ?symptomatic, describe symptoms. ?---Caller stated that he had some confusion about his ?meds. Tested positive for COVID and got bad side ?effects from the meds prescribed to treat it. Was told ?to stop taking the antiviral and start taking his regular ?meds back. Also was treated for a UTI but the next day ?called and was advised that will change the abx. And ?that he hadn't seen his provider in a while. ?Does the patient have any new or worsening ?symptoms? ---No ?Please document clinical information provided and ?list any resource used. ?---Informed caller to call back tomorrow to schedule ?an appointment with his provider if he needed to talk ?to her about his meds. ?Disp. Time (Eastern ?Time) Disposition Final User ?11/20/2021 7:21:23 PM Clinical Call Yes Clydene Laming, RN, Tami Lin ?

## 2021-11-21 NOTE — Telephone Encounter (Signed)
Patient states he feels ok and does not need to have a virtual tomorrow.  ?

## 2021-11-23 ENCOUNTER — Ambulatory Visit: Payer: Medicare HMO | Admitting: Physical Therapy

## 2021-11-24 ENCOUNTER — Encounter (INDEPENDENT_AMBULATORY_CARE_PROVIDER_SITE_OTHER): Payer: Medicare HMO | Admitting: Ophthalmology

## 2021-11-24 ENCOUNTER — Other Ambulatory Visit: Payer: Self-pay | Admitting: Family

## 2021-11-25 ENCOUNTER — Ambulatory Visit: Payer: Medicare HMO | Admitting: Rehabilitative and Restorative Service Providers"

## 2021-11-25 ENCOUNTER — Telehealth: Payer: Self-pay | Admitting: Family

## 2021-11-25 DIAGNOSIS — E039 Hypothyroidism, unspecified: Secondary | ICD-10-CM

## 2021-11-25 NOTE — Telephone Encounter (Signed)
Medication: levothyroxine (SYNTHROID) 150 MCG tablet 90 Day supply ? ?Has the patient contacted their pharmacy? Yes.   ? ?Preferred Pharmacy (with phone number or street name):  ? ?[facility], [address]  ?[place], [place] [address]  ?Phone:  [phone]  Fax:  [phone]  ? ?Agent: Please be advised that RX refills may take up to 3 business days. We ask that you follow-up with your pharmacy.  ?

## 2021-11-28 DIAGNOSIS — G5620 Lesion of ulnar nerve, unspecified upper limb: Secondary | ICD-10-CM | POA: Diagnosis not present

## 2021-11-28 DIAGNOSIS — G5603 Carpal tunnel syndrome, bilateral upper limbs: Secondary | ICD-10-CM | POA: Diagnosis not present

## 2021-11-28 NOTE — Telephone Encounter (Signed)
Center well advised patient prescription is for 75 mg because he takes 150 6 days a week (2 75 mg) and he takes 75 once a week.  ?

## 2021-11-29 ENCOUNTER — Other Ambulatory Visit: Payer: Self-pay

## 2021-11-29 ENCOUNTER — Ambulatory Visit: Payer: Medicare HMO | Admitting: Physical Therapy

## 2021-11-29 DIAGNOSIS — R2689 Other abnormalities of gait and mobility: Secondary | ICD-10-CM | POA: Diagnosis not present

## 2021-11-29 DIAGNOSIS — R42 Dizziness and giddiness: Secondary | ICD-10-CM | POA: Diagnosis not present

## 2021-11-29 DIAGNOSIS — M6281 Muscle weakness (generalized): Secondary | ICD-10-CM

## 2021-11-29 DIAGNOSIS — R2681 Unsteadiness on feet: Secondary | ICD-10-CM | POA: Diagnosis not present

## 2021-11-29 DIAGNOSIS — R29818 Other symptoms and signs involving the nervous system: Secondary | ICD-10-CM | POA: Diagnosis not present

## 2021-11-29 DIAGNOSIS — H8111 Benign paroxysmal vertigo, right ear: Secondary | ICD-10-CM | POA: Diagnosis not present

## 2021-11-29 DIAGNOSIS — M5416 Radiculopathy, lumbar region: Secondary | ICD-10-CM | POA: Diagnosis not present

## 2021-11-29 NOTE — Therapy (Signed)
[facility] ?[place] ?[place], [place], [address] ?Phone: [phone]   Fax:  [phone] ? ?Physical Therapy Treatment ? ?Patient Details  ?Name: [name] Pratt ?MRN: [id number] ?Date of Birth: December 22, 1940 ?Referring Provider (PT): [name], NP ? ? ?Encounter Date: 11/29/2021 ? ? PT End of Session - 11/29/21 1104   ? ? Visit Number 11   ? Number of Visits 16   ? Date for PT Re-Evaluation 11/30/21   ? Authorization Type [name]   ? Authorization - Visit Number 11   ? Progress Note Due on Visit 10   ? PT Start Time 1105   ? PT Stop Time 1145   ? PT Time Calculation (min) 40 min   ? Equipment Utilized During Treatment Gait belt   ? Activity Tolerance Patient tolerated treatment well   ? Behavior During Therapy [facility] for tasks assessed/performed   ? ?  ?  ? ?  ? ? ?Past Medical History:  ?Diagnosis Date  ? Allergic rhinitis 02/02/2016  ? Anemia 12/17/2009  ? Formatting of this note might be different from the original. Anemia  10/1 IMO update  ? Aortic atherosclerosis ([place]) 03/05/2020  ? Arthritis   ? 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 Atypical chest pain 11/24/2015  ? Benign essential hypertension 05/13/2009  ? Qualifier: Diagnosis of  By: Larwance Sachs of this note might be different from the original. Hypertension  ? 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[place]) 07/22/2021  ? C. difficile colitis 04/11/2021  ? Cancer [facility])   ? skin cancer  ? Carotid stenosis   ? a. Carotid U/S 0/81: LICA < 44%, RICA 81-85%;  b.  Carotid U/S 6/31:  RICA 49-70%; LICA 2-63%; f/u 1 year  ? Cervical myelopathy ([place]) 11/24/2020  ? 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 Complication of anesthesia   ? "stomach does not wake up"  ? COPD (chronic obstructive pulmonary disease) ([place])   ? CPAP (continuous positive airway pressure) dependence 03/25/2018  ? 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 Cystoid macular edema of both eyes 07/28/2020   ? Degenerative disc disease, lumbar 06/30/2015  ? Deviated nasal septum 02/18/2015  ? Diverticulosis 03/03/2020  ? DJD (degenerative joint disease) of hip 12/24/2012  ? Dyspnea 06/12/2011  ? Emphysema, unspecified ([place]) 03/05/2020  ? Erectile dysfunction 06/20/2017  ? 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[place]) 02/09/2021  ? Gait disorder 03/02/2021  ? GERD (gastroesophageal reflux disease)   ? 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 Qualifier: Diagnosis of  By: Burnett Kanaris    ? Greater trochanteric pain syndrome 07/22/2021  ? Hearing loss 02/02/2016  ? Heme positive stool 02/29/2020  ? HIATAL HERNIA 05/13/2009  ? Qualifier: Diagnosis of  By: Burnett Kanaris    ? High risk medication use 01/18/2012  ? Hip pain 03/02/2021  ? History of chicken pox   ? History of hemorrhoids   ? History of kidney stones   ? History of total bilateral knee replacement 07/08/2020  ? Hyperglycemia 03/03/2014  ? Hyperlipidemia   ? under control  ? Hyperlipidemia with target LDL less than 130 12/27/2010  ? Formatting of this note might be different from the original. Cardiology - Dr Frances Nickels at [facility] cardiology  ICD-10 cut over  ? Hypertension   ? under control  ? Hypokalemia 07/08/2021  ? Hypothyroidism   ? Internal hemorrhoids 03/03/2020  ? Leg cramps 11/13/2019  ? Lower GI bleed 12/12/2012  ?  Lumbago of lumbar region with sciatica 10/21/2020  ? Lumbar radiculopathy 03/02/2021  ? Lumbar spondylosis 11/02/2020  ? Nasal congestion 04/11/2021  ? NEPHROLITHIASIS 05/13/2009  ? Qualifier: Diagnosis of  By: Burnett Kanaris    ? Nonspecific abnormal electrocardiogram (ECG) (EKG) 01/06/2013  ? Onychomycosis 06/30/2015  ? 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 Formatting of this note might be different from the original. Osteoarthritis Of The Hand  10/1 IMO update  ? Osteoarthrosis, unspecified whether generalized or localized, pelvic region and thigh 07/25/2010  ? Formatting of this note might be  different from the original. Osteoarthritis Of The Hip  ? Other abnormal glucose 07/22/2021  ? Other allergic rhinitis 02/18/2015  ? Other ill-defined and unknown causes of morbidity and mortality 09/12/1999  ? Nov 20, 2000 Entered By: Alycia Patten Comment: ltFeb 23, 2006 Entered By: Alycia Patten Comment: rt 11/05  ? Peripheral neuropathy 06/19/2016  ? PONV (postoperative nausea and vomiting)   ? Preventative health care 11/24/2015  ? Right groin pain 11/29/2012  ? Right inguinal hernia 05/13/2009  ? Qualifier: Diagnosis of  By: Burnett Kanaris    ? RLS (restless legs syndrome) 02/18/2015  ? Rosacea   ? Rotator cuff tear 07/27/2013  ? Situational depression 03/02/2021  ? Sleep apnea with use of continuous positive airway pressure (CPAP) 07/15/2013  ? CPAP set to  7 cm water,  Residual AHi was 7.8  With no leak.  User time 6 hours.  479 days , not used only 12 days - highly compliant 06-23-13  .   ? Spondylitis ([place]) 07/22/2021  ? Status post cervical spinal fusion 07/09/2020  ? 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 Tibialis anterior tendon tear, nontraumatic 11/13/2019  ? Trochanteric bursitis of left hip 06/30/2021  ? 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 Followed by [name]  ? Urinary retention 11/26/2020  ? Ventral hernia 07/08/2012  ? Vertigo 07/08/2021  ? Weight loss 03/03/2020  ? ? ?Past Surgical History:  ?Procedure Laterality Date  ? ABDOMINAL HERNIA REPAIR  07/15/12  ? Dr [name]  ? ANTERIOR CERVICAL DECOMP/DISCECTOMY FUSION N/A 07/09/2020  ? Procedure: ANTERIOR CERVICAL DECOMPRESSION AND FUSION CERVICAL THREE-FOUR.;  Surgeon: [name], MD;  Location: [facility];  Service: Neurosurgery;  Laterality: N/A;  anterior  ? BROW LIFT  05/07/01  ? CYSTOSCOPY WITH URETEROSCOPY AND STENT PLACEMENT Right 01/28/2014  ? Procedure: CYSTOSCOPY WITH URETEROSCOPY, BASKET RETRIVAL AND  STENT PLACEMENT;  Surgeon: [name], MD;  Location: [facility];  Service:  Urology;  Laterality: Right;  ? epidural injections    ? multiple procedures  ? [place]  ? multiple times  ? EYE SURGERY Bilateral 03/25/10, 2012  ? cataract removal  ? HEMORRHOID SURGERY  2014  ? HIATAL HERNIA REPAIR  12/21/93  ? HOLMIUM LASER APPLICATION Right 7/61/9509  ? Procedure: HOLMIUM LASER APPLICATION;  Surgeon: [name], MD;  Location: [facility];  Service: Urology;  Laterality: Right;  ? INGUINAL HERNIA REPAIR Right 07/15/12  ? Dr [name], x2  ? JOINT REPLACEMENT  07/25/04  ? right knee  ? JOINT REPLACEMENT  07/13/10  ? left knee  ? KNEE SURGERY  1995  ? LAPAROSCOPIC CHOLECYSTECTOMY  09/20/06  ? with intraoperative cholangiogram and right inguinal herniorrhaphy with mesh   ? LIGAMENT REPAIR Left 07/2013  ? shoulder  ? LITHOTRIPSY    ? x2  ? NASAL SEPTUM SURGERY  1975  ? POSTERIOR CERVICAL FUSION/FORAMINOTOMY N/A  11/24/2020  ? Procedure: Posterior Cervical Laminectomy Cervical three-four, Cervical four-five with lateral mass fusion;  Surgeon: [name], MD;  Location: [facility];  Service: Neurosurgery;  Laterality: N/A;  ? REFRACTIVE SURGERY Bilateral   ? Right total hip replacement  4/14  ? SKIN CANCER DESTRUCTION    ? nose, ear  ? TONSILLECTOMY  as child  ? TOTAL HIP ARTHROPLASTY Left   ? URETHRAL DILATION  1991  ? [name]  ? ? ?There were no vitals filed for this visit. ? ? Subjective Assessment - 11/29/21 1106   ? ? Subjective Pt reports he feels pretty good after COVID. Pt got procedure done for carpal tunnel. Reports just feeling weak.   ? Pertinent History spondylitic myelopathy, ACDF, HTN, COPD, cervical decompression, sleep apnea   ? Patient Stated Goals "get a lot better."   ? Currently in Pain? No/denies   ? ?  ?  ? ?  ? ? ? ? ? ? ? ? ? ? ? ? ? ? ? ? ? ? ? ? [facility] Adult PT Treatment/Exercise - 11/29/21 0001   ? ?  ? Knee/Hip Exercises: Standing  ? Other Standing Knee Exercises palloff press green tband 2x10; shoulder extension green tband 2x10; staggered stance over cane  with red medicine ball diagonals 2x10   ? Other Standing Knee Exercises lateral band walk red tband x3 reps by counter   ?  ? Knee/Hip Exercises: Seated  ? Sit to Sand 10 reps;without UE support   first rep no wei

## 2021-11-30 NOTE — Telephone Encounter (Signed)
Patient advised we contacted [facility] yesterday and provided clarification on medication dosing ?

## 2021-11-30 NOTE — Telephone Encounter (Signed)
Pt states rx was denied. He also has questions regarding dosage. Please advise.  ?

## 2021-11-30 NOTE — Telephone Encounter (Signed)
Had a phone conversation with [facility] pharmacist and patient and he will like to take Levothyroxine 150 mg tablet instead of '75mg'$  #2 a day. This is to take 6 days a week and he will cotinue 75 mg once a week.  ?Per pharmacyst.: ?Prescriptions need to be written as ,Levothyroxine '150mg'$  take 1 tab 6 days a weed and patient will take 75 mg once a weed.Dispense #84 ?Separate rx to say 75 mg once a week and patient will take '150mg'$  6 days a week. Dispense #24 ? ?Please let me know if this is ok with you and I can send the prescriptions.   ?

## 2021-12-01 ENCOUNTER — Ambulatory Visit (INDEPENDENT_AMBULATORY_CARE_PROVIDER_SITE_OTHER): Payer: Medicare HMO | Admitting: Ophthalmology

## 2021-12-01 ENCOUNTER — Encounter (INDEPENDENT_AMBULATORY_CARE_PROVIDER_SITE_OTHER): Payer: Medicare HMO | Admitting: Ophthalmology

## 2021-12-01 ENCOUNTER — Encounter (INDEPENDENT_AMBULATORY_CARE_PROVIDER_SITE_OTHER): Payer: Self-pay | Admitting: Ophthalmology

## 2021-12-01 ENCOUNTER — Other Ambulatory Visit: Payer: Self-pay

## 2021-12-01 DIAGNOSIS — H35073 Retinal telangiectasis, bilateral: Secondary | ICD-10-CM | POA: Diagnosis not present

## 2021-12-01 DIAGNOSIS — H353211 Exudative age-related macular degeneration, right eye, with active choroidal neovascularization: Secondary | ICD-10-CM | POA: Diagnosis not present

## 2021-12-01 DIAGNOSIS — G4730 Sleep apnea, unspecified: Secondary | ICD-10-CM

## 2021-12-01 DIAGNOSIS — H35353 Cystoid macular degeneration, bilateral: Secondary | ICD-10-CM | POA: Diagnosis not present

## 2021-12-01 MED ORDER — BEVACIZUMAB 2.5 MG/0.1ML IZ SOSY
2.5000 mg | PREFILLED_SYRINGE | INTRAVITREAL | Status: AC | PRN
  Administered 2021-12-01: 2.5 mg via INTRAVITREAL

## 2021-12-01 MED ORDER — LEVOTHYROXINE SODIUM 75 MCG PO TABS: ORAL_TABLET | ORAL | 3 refills | Status: DC

## 2021-12-01 MED ORDER — LEVOTHYROXINE SODIUM 150 MCG PO TABS: ORAL_TABLET | ORAL | 3 refills | Status: DC

## 2021-12-01 NOTE — Assessment & Plan Note (Signed)
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

## 2021-12-01 NOTE — Telephone Encounter (Signed)
Prescriptions sent

## 2021-12-01 NOTE — Progress Notes (Signed)
? ? ?12/01/2021 ? ?  ? ?CHIEF COMPLAINT ?Patient presents for  ?Chief Complaint  ?Patient presents with  ? Macular Degeneration  ? 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 ? ?HISTORY OF PRESENT ILLNESS: ?[name] Pratt is a 81 y.o. male who presents to the clinic today for:  ? ?HPI   ?7 weeks DILATE OU, Avastin OD, OCT. ?Patient states vision is stable and unchanged since last visit. Denies any new floaters or FOL. ?Pt states "the floater in my right eye is still there, in the center of my right eye it just floats around." ?Last edited by Laurin Coder on 12/01/2021  1:27 PM.  ?  ? ? ?Referring physician: ?[name], NP ?[name] ?[address] ?[place],  [place] [address] ? ?HISTORICAL INFORMATION:  ? ?Selected notes from the [facility] ?  ? ?Lab Results  ?Component Value Date  ? HGBA1C 5.7 12/29/2020  ?  ? ?CURRENT MEDICATIONS: ?Current Outpatient Medications (Ophthalmic Drugs)  ?Medication Sig  ? cycloSPORINE (RESTASIS) 0.05 % ophthalmic emulsion Place 1 drop into both eyes 2 (two) times daily.  ? ?No current facility-administered medications for this visit. (Ophthalmic Drugs)  ? ?Current Outpatient Medications (Other)  ?Medication Sig  ? acetaminophen (TYLENOL) 650 MG CR tablet Take 1,300 mg by mouth every morning. And take 650 mg at bedtime  ? albuterol (VENTOLIN HFA) 108 (90 Base) MCG/ACT inhaler INHALE 2 PUFFS INTO THE LUNGS EVERY 6 HOURS AS NEEDED FOR SHORTNESS OF BREATH.  ? Alcohol Swabs (ALCOHOL PADS) 70 % PADS 1 packet by Does not apply route 2 (two) times daily.  ? allopurinol (ZYLOPRIM) 100 MG tablet Take 100 mg by mouth 2 (two) times daily.  ? amLODipine (NORVASC) 10 MG tablet Take 1 tablet (10 mg total) by mouth daily.  ? aspirin EC 81 MG tablet Take 81 mg by mouth every morning.  ? cephALEXin (KEFLEX) 500 MG capsule Take 1 capsule (500 mg total) by mouth 3 (three) times  daily.  ? Cholecalciferol (VITAMIN D) 50 MCG (2000 UT) CAPS Take 2,000 Units by mouth daily.  ? colchicine 0.6 MG tablet Take 0.6 mg by mouth as needed for other. For gout  ? diclofenac Sodium (VOLTAREN) 1 % GEL Apply 1 application topically daily as needed for pain (Hand pain).  ? fluocinonide (LIDEX) 0.05 % external solution Apply 1 application topically 2 (two) times daily as needed for itching or pain.  ? gabapentin (NEURONTIN) 300 MG capsule TAKE 1 CAPSULE TWO TO THREE TIMES DAILY AS NEEDED  ? GEMTESA 75 MG TABS Take 1 tablet by mouth daily.  ? glucose blood test strip TRUE Metrix Blood Glucose Test Strip, use as instructed  ? Homeopathic Products (THERAWORX RELIEF) FOAM Apply 1 application topically 2 (two) times daily as needed (Pain).  ? hydrALAZINE (APRESOLINE) 50 MG tablet Take 1 tablet (50 mg total) by mouth 2 (two) times daily.  ? Lancets MISC 1 each by Does not apply route 2 (two) times daily. TRUE matrix lancets  ? levothyroxine (SYNTHROID) 150 MCG tablet Take 150 mcg 6 days a week (patient takes 75 mcg once a week) ?Disp #84 for 90 day supply  ? levothyroxine (SYNTHROID) 75 MCG tablet Take 75 mcg once a week (patient on 150 mcg 6 days a week) ?Dispense #24 for 90 day supply.  ? losartan (COZAAR) 100 MG tablet TAKE 1 TABLET EVERY DAY  ?  Magnesium 500 MG TABS Take by mouth. OTC  ? meclizine (ANTIVERT) 25 MG tablet Take 1 tablet (25 mg total) by mouth 3 (three) times daily as needed for dizziness.  ? multivitamin (THERAGRAN) per tablet Take 1 tablet by mouth daily.  ? nitroGLYCERIN (NITROSTAT) 0.4 MG SL tablet Place 1 tablet (0.4 mg total) under the tongue every 5 (five) minutes as needed for chest pain.  ? ondansetron (ZOFRAN-ODT) 4 MG disintegrating tablet Take 1 tablet (4 mg total) by mouth every 8 (eight) hours as needed for nausea or vomiting.  ? polyethylene glycol (MIRALAX / GLYCOLAX) 17 g packet Take 17-34 g by mouth daily as needed.  ? rosuvastatin (CRESTOR) 5 MG tablet TAKE 1 TABLET EVERY DAY   ? sodium chloride (OCEAN) 0.65 % nasal spray Place 1 spray into both nostrils at bedtime as needed for congestion.  ? ?No current facility-administered medications for this visit. (Other)  ? ? ? ? ?REVIEW OF SYSTEMS: ?ROS   ?Negative for: Constitutional, Gastrointestinal, Neurological, Skin, Genitourinary, Musculoskeletal, HENT, Endocrine, Cardiovascular, Eyes, Respiratory, Psychiatric, Allergic/Imm, Heme/Lymph ?Last edited by Hurman Horn, MD on 12/01/2021  1:58 PM.  ?  ? ? ? ?ALLERGIES ?Allergies  ?Allergen Reactions  ? Pravastatin Other (See Comments)  ?  Muscle cramps  ? Sildenafil Other (See Comments)  ?  Tachycardia ? ?  ? Oxycodone   ?  hallucinations  ? Atenolol Other (See Comments)  ?  bradycardia  ? Elastic Bandages & [Zinc] Other (See Comments)  ?  Teflon bandages - Irritation  ? Metoclopramide Hcl Anxiety  ? ? ?PAST MEDICAL HISTORY ?Past Medical History:  ?Diagnosis Date  ? Allergic rhinitis 02/02/2016  ? Anemia 12/17/2009  ? Formatting of this note might be different from the original. Anemia  10/1 IMO update  ? Aortic atherosclerosis ([place]) 03/05/2020  ? Arthritis   ? 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 Atypical chest pain 11/24/2015  ? Benign essential hypertension 05/13/2009  ? Qualifier: Diagnosis of  By: Larwance Sachs of this note might be different from the original. Hypertension  ? 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[place]) 07/22/2021  ? C. difficile colitis 04/11/2021  ? Cancer [facility])   ? skin cancer  ? Carotid stenosis   ? a. Carotid U/S 6/80: LICA < 32%, RICA 12-24%;  b.  Carotid U/S 8/25:  RICA 00-37%; LICA 0-48%; f/u 1 year  ? Cervical myelopathy ([place]) 11/24/2020  ? 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 Complication of anesthesia   ? "stomach does not wake up"  ? COPD (chronic obstructive pulmonary disease) ([facility])   ? CPAP (continuous positive airway pressure) dependence 03/25/2018  ? 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 Cystoid macular edema of both eyes  07/28/2020  ? Degenerative disc disease, lumbar 06/30/2015  ? Deviated nasal septum 02/18/2015  ? Diverticulosis 03/03/2020  ? DJD (degenerative joint disease) of hip 12/24/2012  ? Dyspnea 06/12/2011  ? Emphysema, unspecified ([place]) 03/05/2020  ? Erectile dysfunction 06/20/2017  ? 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[employer]) 02/09/2021  ? Gait disorder 03/02/2021  ? GERD (gastroesophageal reflux disease)   ? 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 Qualifier: Diagnosis of  By: Burnett Kanaris    ? Greater trochanteric pain syndrome 07/22/2021  ? Hearing loss 02/02/2016  ? Heme positive stool 02/29/2020  ? HIATAL HERNIA 05/13/2009  ? Qualifier: Diagnosis of  By: Burnett Kanaris    ? High risk medication use 01/18/2012  ? Hip pain  03/02/2021  ? History of chicken pox   ? History of hemorrhoids   ? History of kidney stones   ? History of total bilateral knee replacement 07/08/2020  ? Hyperglycemia 03/03/2014  ? Hyperlipidemia   ? under control  ? Hyperlipidemia with target LDL less than 130 12/27/2010  ? Formatting of this note might be different from the original. Cardiology - Dr Frances Nickels at [facility] cardiology  ICD-10 cut over  ? Hypertension   ? under control  ? Hypokalemia 07/08/2021  ? Hypothyroidism   ? Internal hemorrhoids 03/03/2020  ? Leg cramps 11/13/2019  ? Lower GI bleed 12/12/2012  ? Lumbago of lumbar region with sciatica 10/21/2020  ? Lumbar radiculopathy 03/02/2021  ? Lumbar spondylosis 11/02/2020  ? Nasal congestion 04/11/2021  ? NEPHROLITHIASIS 05/13/2009  ? Qualifier: Diagnosis of  By: Burnett Kanaris    ? Nonspecific abnormal electrocardiogram (ECG) (EKG) 01/06/2013  ? Onychomycosis 06/30/2015  ? 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 Formatting of this note might be different from the original. Osteoarthritis Of The Hand  10/1 IMO update  ? Osteoarthrosis, unspecified whether generalized or localized, pelvic region and thigh 07/25/2010  ? Formatting of this note  might be different from the original. Osteoarthritis Of The Hip  ? Other abnormal glucose 07/22/2021  ? Other allergic rhinitis 02/18/2015  ? Other ill-defined and unknown causes of morbidity and mortality 01

## 2021-12-01 NOTE — Addendum Note (Signed)
Addended by: Jiles Prows on: 12/01/2021 08:33 AM ? ? Modules accepted: Orders ? ?

## 2021-12-01 NOTE — Assessment & Plan Note (Signed)
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

## 2021-12-01 NOTE — Assessment & Plan Note (Signed)
Continues to be compliant ?

## 2021-12-02 ENCOUNTER — Ambulatory Visit: Payer: Medicare HMO | Admitting: Physical Therapy

## 2021-12-02 DIAGNOSIS — H8111 Benign paroxysmal vertigo, right ear: Secondary | ICD-10-CM | POA: Diagnosis not present

## 2021-12-02 DIAGNOSIS — R2689 Other abnormalities of gait and mobility: Secondary | ICD-10-CM | POA: Diagnosis not present

## 2021-12-02 DIAGNOSIS — R2681 Unsteadiness on feet: Secondary | ICD-10-CM | POA: Diagnosis not present

## 2021-12-02 DIAGNOSIS — M6281 Muscle weakness (generalized): Secondary | ICD-10-CM | POA: Diagnosis not present

## 2021-12-02 DIAGNOSIS — R29818 Other symptoms and signs involving the nervous system: Secondary | ICD-10-CM | POA: Diagnosis not present

## 2021-12-02 DIAGNOSIS — R42 Dizziness and giddiness: Secondary | ICD-10-CM

## 2021-12-02 NOTE — Therapy (Signed)
[place] ?[facility] ?[place] ?[place], [place], [address] ?Phone: [phone]   Fax:  [phone] ? ?Physical Therapy Treatment and Re-Certification ? ?Patient Details  ?Name: [name] Pratt ?MRN: [id number] ?Date of Birth: 02/14/1941 ?Referring Provider (PT): [name], NP ? ? ?Encounter Date: 12/02/2021 ? ? PT End of Session - 12/02/21 1106   ? ? Visit Number 12   ? Number of Visits 16   ? Date for PT Re-Evaluation 11/30/21   ? Authorization Type [name]   ? Authorization - Visit Number 12   ? Progress Note Due on Visit 10   ? PT Start Time 1106   ? PT Stop Time 1145   ? PT Time Calculation (min) 39 min   ? Equipment Utilized During Treatment Gait belt   ? Activity Tolerance Patient tolerated treatment well   ? Behavior During Therapy [facility] for tasks assessed/performed   ? ?  ?  ? ?  ? ? ?Past Medical History:  ?Diagnosis Date  ? Allergic rhinitis 02/02/2016  ? Anemia 12/17/2009  ? Formatting of this note might be different from the original. Anemia  10/1 IMO update  ? Aortic atherosclerosis ([place]) 03/05/2020  ? Arthritis   ? 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 Atypical chest pain 11/24/2015  ? Benign essential hypertension 05/13/2009  ? Qualifier: Diagnosis of  By: Larwance Sachs of this note might be different from the original. Hypertension  ? 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[place]) 07/22/2021  ? C. difficile colitis 04/11/2021  ? Cancer [facility])   ? skin cancer  ? Carotid stenosis   ? a. Carotid U/S 4/65: LICA < 03%, RICA 54-65%;  b.  Carotid U/S 6/81:  RICA 27-51%; LICA 7-00%; f/u 1 year  ? Cervical myelopathy ([place]) 11/24/2020  ? 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 Complication of anesthesia   ? "stomach does not wake up"  ? COPD (chronic obstructive pulmonary disease) ([place])   ? CPAP (continuous positive airway pressure) dependence 03/25/2018  ? 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 Cystoid macular edema of  both eyes 07/28/2020  ? Degenerative disc disease, lumbar 06/30/2015  ? Deviated nasal septum 02/18/2015  ? Diverticulosis 03/03/2020  ? DJD (degenerative joint disease) of hip 12/24/2012  ? Dyspnea 06/12/2011  ? Emphysema, unspecified ([place]) 03/05/2020  ? Erectile dysfunction 06/20/2017  ? 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[place]) 02/09/2021  ? Gait disorder 03/02/2021  ? GERD (gastroesophageal reflux disease)   ? 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 Qualifier: Diagnosis of  By: Burnett Kanaris    ? Greater trochanteric pain syndrome 07/22/2021  ? Hearing loss 02/02/2016  ? Heme positive stool 02/29/2020  ? HIATAL HERNIA 05/13/2009  ? Qualifier: Diagnosis of  By: Burnett Kanaris    ? High risk medication use 01/18/2012  ? Hip pain 03/02/2021  ? History of chicken pox   ? History of hemorrhoids   ? History of kidney stones   ? History of total bilateral knee replacement 07/08/2020  ? Hyperglycemia 03/03/2014  ? Hyperlipidemia   ? under control  ? Hyperlipidemia with target LDL less than 130 12/27/2010  ? Formatting of this note might be different from the original. Cardiology - [facility]els [facility]  ICD-10 cut over  ? Hypertension   ? under control  ? Hypokalemia 07/08/2021  ? Hypothyroidism   ? Internal hemorrhoids 03/03/2020  ? Leg cramps 11/13/2019  ? Lower GI  bleed 12/12/2012  ? Lumbago of lumbar region with sciatica 10/21/2020  ? Lumbar radiculopathy 03/02/2021  ? Lumbar spondylosis 11/02/2020  ? Nasal congestion 04/11/2021  ? NEPHROLITHIASIS 05/13/2009  ? Qualifier: Diagnosis of  By: Burnett Kanaris    ? Nonspecific abnormal electrocardiogram (ECG) (EKG) 01/06/2013  ? Onychomycosis 06/30/2015  ? 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 Formatting of this note might be different from the original. Osteoarthritis Of The Hand  10/1 IMO update  ? Osteoarthrosis, unspecified whether generalized or localized, pelvic region and thigh 07/25/2010  ? Formatting of  this note might be different from the original. Osteoarthritis Of The Hip  ? Other abnormal glucose 07/22/2021  ? Other allergic rhinitis 02/18/2015  ? Other ill-defined and unknown causes of morbidity and mortality 09/12/1999  ? Nov 20, 2000 Entered By: Alycia Patten Comment: ltFeb 23, 2006 Entered By: Alycia Patten Comment: rt 11/05  ? Peripheral neuropathy 06/19/2016  ? PONV (postoperative nausea and vomiting)   ? Preventative health care 11/24/2015  ? Right groin pain 11/29/2012  ? Right inguinal hernia 05/13/2009  ? Qualifier: Diagnosis of  By: Burnett Kanaris    ? RLS (restless legs syndrome) 02/18/2015  ? Rosacea   ? Rotator cuff tear 07/27/2013  ? Situational depression 03/02/2021  ? Sleep apnea with use of continuous positive airway pressure (CPAP) 07/15/2013  ? CPAP set to  7 cm water,  Residual AHi was 7.8  With no leak.  User time 6 hours.  479 days , not used only 12 days - highly compliant 06-23-13  .   ? 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 Status post cervical spinal fusion 07/09/2020  ? 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 Tibialis anterior tendon tear, nontraumatic 11/13/2019  ? Trochanteric bursitis of left hip 06/30/2021  ? 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 Followed by [name]  ? Urinary retention 11/26/2020  ? Ventral hernia 07/08/2012  ? Vertigo 07/08/2021  ? Weight loss 03/03/2020  ? ? ?Past Surgical History:  ?Procedure Laterality Date  ? ABDOMINAL HERNIA REPAIR  07/15/12  ? Dr [name]  ? ANTERIOR CERVICAL DECOMP/DISCECTOMY FUSION N/A 07/09/2020  ? Procedure: ANTERIOR CERVICAL DECOMPRESSION AND FUSION CERVICAL THREE-FOUR.;  Surgeon: [name], MD;  Location: [facility];  Service: Neurosurgery;  Laterality: N/A;  anterior  ? BROW LIFT  05/07/01  ? CYSTOSCOPY WITH URETEROSCOPY AND STENT PLACEMENT Right 01/28/2014  ? Procedure: CYSTOSCOPY WITH URETEROSCOPY, BASKET RETRIVAL AND  STENT PLACEMENT;  Surgeon: [name], MD;  [facility]: [facility];  Service: Urology;  Laterality: Right;  ? epidural injections    ? multiple procedures  ? [place]  ? multiple times  ? EYE SURGERY Bilateral 03/25/10, 2012  ? cataract removal  ? HEMORRHOID SURGERY  2014  ? HIATAL HERNIA REPAIR  12/21/93  ? HOLMIUM LASER APPLICATION Right 1/61/0960  ? Procedure: HOLMIUM LASER APPLICATION;  Surgeon: [name], MD;  [facility];  Service: Urology;  Laterality: Right;  ? INGUINAL HERNIA REPAIR Right 07/15/12  ? Dr [name], x2  ? JOINT REPLACEMENT  07/25/04  ? right knee  ? JOINT REPLACEMENT  07/13/10  ? left knee  ? KNEE SURGERY  1995  ? LAPAROSCOPIC CHOLECYSTECTOMY  09/20/06  ? with intraoperative cholangiogram and right inguinal herniorrhaphy with mesh   ? LIGAMENT REPAIR Left 07/2013  ? shoulder  ? LITHOTRIPSY    ? x2  ? NASAL SEPTUM SURGERY  1975  ?  POSTERIOR CERVICAL FUSION/FORAMINOTOMY N/A 11/24/2020  ? Procedure: Posterior Cervical Laminectomy Cervical three-four, Cervical four-five with lateral mass fusion;  Surgeon: [name], MD;  Location: [facility];  Service: Neurosurgery;  Laterality: N/A;  ? REFRACTIVE SURGERY Bilateral   ? Right total hip replacement  4/14  ? SKIN CANCER DESTRUCTION    ? nose, ear  ? TONSILLECTOMY  as child  ? TOTAL HIP ARTHROPLASTY Left   ? URETHRAL DILATION  1991  ? [name]  ? ? ?There were no vitals filed for this visit. ? ? Subjective Assessment - 12/02/21 1109   ? ? Subjective Pt states he got a shot in his back -- it's been helping. He slept good and is feeling pretty good today.   ? Pertinent History spondylitic myelopathy, ACDF, HTN, COPD, cervical decompression, sleep apnea   ? Patient Stated Goals "get a lot better."   ? Currently in Pain? No/denies   ? ?  ?  ? ?  ? ? ? ? ? [facility] PT Assessment - 12/02/21 0001   ? ?  ? Assessment  ? Medical Diagnosis R26.9 (ICD-10-CM) - Gait disorder  R42 (ICD-10-CM) - Vertigo   ? Referring Provider (PT) [name], NP   ? Onset Date/Surgical Date 08/27/21   ? Hand  Dominance Right   ?  ? Berg Balance Test  ? Turn 360 Degrees Able to turn 360 degrees safely but slowly   ? Standing Unsupported, One Foot in Front Able to plae foot ahead of the other independently and hold 30

## 2021-12-05 ENCOUNTER — Ambulatory Visit (INDEPENDENT_AMBULATORY_CARE_PROVIDER_SITE_OTHER): Payer: Medicare HMO | Admitting: Family

## 2021-12-05 ENCOUNTER — Ambulatory Visit: Payer: Medicare HMO | Admitting: Physical Therapy

## 2021-12-05 ENCOUNTER — Other Ambulatory Visit: Payer: Self-pay

## 2021-12-05 VITALS — BP 137/58 | HR 62 | Temp 98.0°F | Resp 16 | Wt 158.0 lb

## 2021-12-05 DIAGNOSIS — I10 Essential (primary) hypertension: Secondary | ICD-10-CM

## 2021-12-05 DIAGNOSIS — Z8616 Personal history of COVID-19: Secondary | ICD-10-CM

## 2021-12-05 DIAGNOSIS — R2681 Unsteadiness on feet: Secondary | ICD-10-CM | POA: Diagnosis not present

## 2021-12-05 DIAGNOSIS — N3000 Acute cystitis without hematuria: Secondary | ICD-10-CM | POA: Insufficient documentation

## 2021-12-05 DIAGNOSIS — H8111 Benign paroxysmal vertigo, right ear: Secondary | ICD-10-CM

## 2021-12-05 DIAGNOSIS — H8110 Benign paroxysmal vertigo, unspecified ear: Secondary | ICD-10-CM | POA: Diagnosis not present

## 2021-12-05 DIAGNOSIS — M6281 Muscle weakness (generalized): Secondary | ICD-10-CM

## 2021-12-05 DIAGNOSIS — R42 Dizziness and giddiness: Secondary | ICD-10-CM | POA: Diagnosis not present

## 2021-12-05 DIAGNOSIS — R29818 Other symptoms and signs involving the nervous system: Secondary | ICD-10-CM | POA: Diagnosis not present

## 2021-12-05 DIAGNOSIS — E039 Hypothyroidism, unspecified: Secondary | ICD-10-CM | POA: Diagnosis not present

## 2021-12-05 DIAGNOSIS — R2689 Other abnormalities of gait and mobility: Secondary | ICD-10-CM

## 2021-12-05 DIAGNOSIS — E782 Mixed hyperlipidemia: Secondary | ICD-10-CM | POA: Diagnosis not present

## 2021-12-05 HISTORY — DX: Personal history of COVID-19: Z86.16

## 2021-12-05 NOTE — Assessment & Plan Note (Signed)
Currently asymptomatic  Monitor

## 2021-12-05 NOTE — Assessment & Plan Note (Signed)
Reports that he completed abx given by the ED.  Some mild dysuria occasionally. Will repeat urine culture.  ?

## 2021-12-05 NOTE — Assessment & Plan Note (Signed)
He appears to have full recovered.  Monitor.  ?

## 2021-12-05 NOTE — Assessment & Plan Note (Signed)
Lab Results  ?Component Value Date  ? TSH 2.77 06/15/2021  ? ?Stable. Continue current dose of synthroid.  ?

## 2021-12-05 NOTE — Assessment & Plan Note (Signed)
Maintained on crestor '5mg'$  Lipids at goal. Continue saem.  ?

## 2021-12-05 NOTE — Assessment & Plan Note (Signed)
>>  ASSESSMENT AND PLAN FOR BENIGN ESSENTIAL HYPERTENSION WRITTEN ON 12/05/2021 10:05 AM BY O'SULLIVAN, Tomeko Scoville, NP  BP Readings from Last 3 Encounters:  12/05/21 (!) 137/58  11/17/21 (!) 153/64  09/13/21 (!) 147/64   BP is stable. Continue losartan 100mg  and hydralazine 50mg  bid.

## 2021-12-05 NOTE — Assessment & Plan Note (Signed)
BP Readings from Last 3 Encounters:  ?12/05/21 (!) 137/58  ?11/17/21 (!) 153/64  ?09/13/21 (!) 147/64  ? ?BP is stable. Continue losartan '100mg'$  and hydralazine '50mg'$  bid.  ?

## 2021-12-05 NOTE — Therapy (Signed)
[place] ?[facility] ?[place] ?[place], [place], [address] ?Phone: [phone]   Fax:  [phone] ? ?Physical Therapy Treatment ? ?Patient Details  ?Name: [name] Pratt ?MRN: [id number] ?Date of Birth: Sep 09, 1941 ?Referring Provider (PT): [name], NP ? ? ?Encounter Date: 12/05/2021 ? ? PT End of Session - 12/05/21 0758   ? ? Visit Number 13   ? Number of Visits 16   ? Date for PT Re-Evaluation 12/23/21   ? Authorization Type [name]   ? Authorization - Visit Number 13   ? Progress Note Due on Visit 10   ? PT Start Time [phone]   ? PT Stop Time [phone]   ? PT Time Calculation (min) 40 min   ? Equipment Utilized During Treatment Gait belt   ? Activity Tolerance Patient tolerated treatment well   ? Behavior During Therapy [facility] for tasks assessed/performed   ? ?  ?  ? ?  ? ? ?Past Medical History:  ?Diagnosis Date  ? Allergic rhinitis 02/02/2016  ? Anemia 12/17/2009  ? Formatting of this note might be different from the original. Anemia  10/1 IMO update  ? Aortic atherosclerosis ([place]) 03/05/2020  ? Arthritis   ? 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 Atypical chest pain 11/24/2015  ? Benign essential hypertension 05/13/2009  ? Qualifier: Diagnosis of  By: Larwance Sachs of this note might be different from the original. Hypertension  ? 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[place]) 07/22/2021  ? C. difficile colitis 04/11/2021  ? Cancer [facility])   ? skin cancer  ? Carotid stenosis   ? a. Carotid U/S 8/36: LICA < 62%, RICA 94-76%;  b.  Carotid U/S 5/46:  RICA 50-35%; LICA 4-65%; f/u 1 year  ? Cervical myelopathy ([place]) 11/24/2020  ? 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 Complication of anesthesia   ? "stomach does not wake up"  ? COPD (chronic obstructive pulmonary disease) ([place])   ? CPAP (continuous positive airway pressure) dependence 03/25/2018  ? 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 Cystoid macular edema of both eyes 07/28/2020   ? Degenerative disc disease, lumbar 06/30/2015  ? Deviated nasal septum 02/18/2015  ? Diverticulosis 03/03/2020  ? DJD (degenerative joint disease) of hip 12/24/2012  ? Dyspnea 06/12/2011  ? Emphysema, unspecified ([place]) 03/05/2020  ? Erectile dysfunction 06/20/2017  ? 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[place]) 02/09/2021  ? Gait disorder 03/02/2021  ? GERD (gastroesophageal reflux disease)   ? 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 Qualifier: Diagnosis of  By: Burnett Kanaris    ? Greater trochanteric pain syndrome 07/22/2021  ? Hearing loss 02/02/2016  ? Heme positive stool 02/29/2020  ? HIATAL HERNIA 05/13/2009  ? Qualifier: Diagnosis of  By: Burnett Kanaris    ? High risk medication use 01/18/2012  ? Hip pain 03/02/2021  ? History of chicken pox   ? History of hemorrhoids   ? History of kidney stones   ? History of total bilateral knee replacement 07/08/2020  ? Hyperglycemia 03/03/2014  ? Hyperlipidemia   ? under control  ? Hyperlipidemia with target LDL less than 130 12/27/2010  ? Formatting of this note might be different from the original. Cardiology - Dr Frances Nickels at [facility] cardiology  ICD-10 cut over  ? Hypertension   ? under control  ? Hypokalemia 07/08/2021  ? Hypothyroidism   ? Internal hemorrhoids 03/03/2020  ? Leg cramps 11/13/2019  ? Lower GI bleed 12/12/2012  ?  Lumbago of lumbar region with sciatica 10/21/2020  ? Lumbar radiculopathy 03/02/2021  ? Lumbar spondylosis 11/02/2020  ? Nasal congestion 04/11/2021  ? NEPHROLITHIASIS 05/13/2009  ? Qualifier: Diagnosis of  By: Burnett Kanaris    ? Nonspecific abnormal electrocardiogram (ECG) (EKG) 01/06/2013  ? Onychomycosis 06/30/2015  ? 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 Formatting of this note might be different from the original. Osteoarthritis Of The Hand  10/1 IMO update  ? Osteoarthrosis, unspecified whether generalized or localized, pelvic region and thigh 07/25/2010  ? Formatting of this note might be  different from the original. Osteoarthritis Of The Hip  ? Other abnormal glucose 07/22/2021  ? Other allergic rhinitis 02/18/2015  ? Other ill-defined and unknown causes of morbidity and mortality 09/12/1999  ? Nov 20, 2000 Entered By: Alycia Patten Comment: ltFeb 23, 2006 Entered By: Alycia Patten Comment: rt 11/05  ? Peripheral neuropathy 06/19/2016  ? PONV (postoperative nausea and vomiting)   ? Preventative health care 11/24/2015  ? Right groin pain 11/29/2012  ? Right inguinal hernia 05/13/2009  ? Qualifier: Diagnosis of  By: Burnett Kanaris    ? RLS (restless legs syndrome) 02/18/2015  ? Rosacea   ? Rotator cuff tear 07/27/2013  ? Situational depression 03/02/2021  ? Sleep apnea with use of continuous positive airway pressure (CPAP) 07/15/2013  ? CPAP set to  7 cm water,  Residual AHi was 7.8  With no leak.  User time 6 hours.  479 days , not used only 12 days - highly compliant 06-23-13  .   ? 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 Status post cervical spinal fusion 07/09/2020  ? 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 Tibialis anterior tendon tear, nontraumatic 11/13/2019  ? Trochanteric bursitis of left hip 06/30/2021  ? 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 Followed by [name]  ? Urinary retention 11/26/2020  ? Ventral hernia 07/08/2012  ? Vertigo 07/08/2021  ? Weight loss 03/03/2020  ? ? ?Past Surgical History:  ?Procedure Laterality Date  ? ABDOMINAL HERNIA REPAIR  07/15/12  ? Dr [name]  ? ANTERIOR CERVICAL DECOMP/DISCECTOMY FUSION N/A 07/09/2020  ? Procedure: ANTERIOR CERVICAL DECOMPRESSION AND FUSION CERVICAL THREE-FOUR.;  Surgeon: [name], MD;  Location: [facility];  Service: Neurosurgery;  Laterality: N/A;  anterior  ? BROW LIFT  05/07/01  ? CYSTOSCOPY WITH URETEROSCOPY AND STENT PLACEMENT Right 01/28/2014  ? Procedure: CYSTOSCOPY WITH URETEROSCOPY, BASKET RETRIVAL AND  STENT PLACEMENT;  Surgeon: [name], MD;  [facility];  Service:  Urology;  Laterality: Right;  ? epidural injections    ? multiple procedures  ? [place]  ? multiple times  ? EYE SURGERY Bilateral 03/25/10, 2012  ? cataract removal  ? HEMORRHOID SURGERY  2014  ? HIATAL HERNIA REPAIR  12/21/93  ? HOLMIUM LASER APPLICATION Right 1/63/8466  ? Procedure: HOLMIUM LASER APPLICATION;  Surgeon: [name], MD;  [facility];  Service: Urology;  Laterality: Right;  ? INGUINAL HERNIA REPAIR Right 07/15/12  ? Dr [name], x2  ? JOINT REPLACEMENT  07/25/04  ? right knee  ? JOINT REPLACEMENT  07/13/10  ? left knee  ? KNEE SURGERY  1995  ? LAPAROSCOPIC CHOLECYSTECTOMY  09/20/06  ? with intraoperative cholangiogram and right inguinal herniorrhaphy with mesh   ? LIGAMENT REPAIR Left 07/2013  ? shoulder  ? LITHOTRIPSY    ? x2  ? NASAL SEPTUM SURGERY  1975  ? POSTERIOR CERVICAL FUSION/FORAMINOTOMY N/A  11/24/2020  ? Procedure: Posterior Cervical Laminectomy Cervical three-four, Cervical four-five with lateral mass fusion;  Surgeon: [name], MD;  Location: [facility];  Service: Neurosurgery;  Laterality: N/A;  ? REFRACTIVE SURGERY Bilateral   ? Right total hip replacement  4/14  ? SKIN CANCER DESTRUCTION    ? nose, ear  ? TONSILLECTOMY  as child  ? TOTAL HIP ARTHROPLASTY Left   ? URETHRAL DILATION  1991  ? [name]  ? ? ?There were no vitals filed for this visit. ? ? Subjective Assessment - 12/05/21 0800   ? ? Subjective Pt reports he has a check up coming up after this session. Pt states he slept good.   ? Pertinent History spondylitic myelopathy, ACDF, HTN, COPD, cervical decompression, sleep apnea   ? Patient Stated Goals "get a lot better."   ? Currently in Pain? No/denies   ? ?  ?  ? ?  ? ? ? ? ? [facility] PT Assessment - 12/05/21 0001   ? ?  ? Assessment  ? Medical Diagnosis R26.9 (ICD-10-CM) - Gait disorder  R42 (ICD-10-CM) - Vertigo   ? Referring Provider (PT) [name], NP   ? Onset Date/Surgical Date 08/27/21   ? Hand Dominance Right   ? ?  ?  ? ?   ? ? ? ? ? ? ? ? ? ? ? ? ? ? ? ? [facility] Adult PT Treatment/Exercise - 12/05/21 0001   ? ?  ? Knee/Hip Exercises: Stretches  ? Gastroc Stretch Right;Left;2 reps;20 seconds   ?  ? Knee/Hip Exercises: Aerobic  ? Nustep L5 x 5 mi

## 2021-12-05 NOTE — Progress Notes (Signed)
? ?Subjective:  ? ? ? Patient ID: [name] Pratt, male    DOB: 1941-02-02, 81 y.o.   MRN: [id number] ? ?Chief Complaint  ?Patient presents with  ? Hypothyroidism  ?  Here for follow up  ? ? ?HPI ?Patient is in today for follow up. ? ?Hypothyroidism- maintained on synthroid '150mg'$  + '75mg'$ .  ?Lab Results  ?Component Value Date  ? TSH 2.77 06/15/2021  ? ?Hyperlipidemia- maintained on crestor '5mg'$ .  ?Lab Results  ?Component Value Date  ? CHOL 117 09/13/2021  ? HDL 50.40 09/13/2021  ? [place] 50 09/13/2021  ? TRIG 80.0 09/13/2021  ? CHOLHDL 2 09/13/2021  ? ?HTN- maintained on losartan '100mg'$ , amlodipine '10mg'$ ,  ?BP Readings from Last 3 Encounters:  ?12/05/21 (!) 137/58  ?11/17/21 (!) 153/64  ?09/13/21 (!) 147/64  ? ?BPPV- No further dizziness. Returned to PT and reports that he was evaluated and said he was OK.  ? ?COVID-19- went to the ED on 3/9 due SOB.  He was also treated for UTI.  Notes occasional dysuria. ED record is reviewed.  ? ?Health Maintenance Due  ?Topic Date Due  ? TETANUS/TDAP  12/26/2020  ? FOOT EXAM  09/29/2021  ? ? ?Past Medical History:  ?Diagnosis Date  ? Allergic rhinitis 02/02/2016  ? Anemia 12/17/2009  ? Formatting of this note might be different from the original. Anemia  10/1 IMO update  ? Aortic atherosclerosis ([place]) 03/05/2020  ? Arthritis   ? 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 Atypical chest pain 11/24/2015  ? Benign essential hypertension 05/13/2009  ? Qualifier: Diagnosis of  By: Larwance Sachs of this note might be different from the original. Hypertension  ? 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[place]) 07/22/2021  ? C. difficile colitis 04/11/2021  ? Cancer [facility])   ? skin cancer  ? Carotid stenosis   ? a. Carotid U/S 9/98: LICA < 33%, RICA 82-50%;  b.  Carotid U/S 5/39:  RICA 76-73%; LICA 4-19%; f/u 1 year  ? 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 Complication of anesthesia   ? "stomach does not wake up"  ? COPD (chronic  obstructive pulmonary disease) ([place])   ? CPAP (continuous positive airway pressure) dependence 03/25/2018  ? 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 Cystoid macular edema of both eyes 07/28/2020  ? Degenerative disc disease, lumbar 06/30/2015  ? Deviated nasal septum 02/18/2015  ? Diverticulosis 03/03/2020  ? DJD (degenerative joint disease) of hip 12/24/2012  ? Dyspnea 06/12/2011  ? Emphysema, unspecified ([place]) 03/05/2020  ? Erectile dysfunction 06/20/2017  ? 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[place]) 02/09/2021  ? Gait disorder 03/02/2021  ? GERD (gastroesophageal reflux disease)   ? 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 Qualifier: Diagnosis of  By: Burnett Kanaris    ? Greater trochanteric pain syndrome 07/22/2021  ? Hearing loss 02/02/2016  ? Heme positive stool 02/29/2020  ? HIATAL HERNIA 05/13/2009  ? Qualifier: Diagnosis of  By: Burnett Kanaris    ? High risk medication use 01/18/2012  ? Hip pain 03/02/2021  ? History of chicken pox   ? History of hemorrhoids   ? History of kidney stones   ? History of total bilateral knee replacement 07/08/2020  ? Hyperglycemia 03/03/2014  ? Hyperlipidemia   ? under control  ? Hyperlipidemia with target LDL less than 130 12/27/2010  ? Formatting of this note might be different from the original. Cardiology - Dr Frances Nickels  at [facility] cardiology  ICD-10 cut over  ? Hypertension   ? under control  ? Hypokalemia 07/08/2021  ? Hypothyroidism   ? Internal hemorrhoids 03/03/2020  ? Leg cramps 11/13/2019  ? Lower GI bleed 12/12/2012  ? Lumbago of lumbar region with sciatica 10/21/2020  ? Lumbar radiculopathy 03/02/2021  ? Lumbar spondylosis 11/02/2020  ? Nasal congestion 04/11/2021  ? NEPHROLITHIASIS 05/13/2009  ? Qualifier: Diagnosis of  By: Burnett Kanaris    ? Nonspecific abnormal electrocardiogram (ECG) (EKG) 01/06/2013  ? Onychomycosis 06/30/2015  ? 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 Formatting of this note might be different from the  original. Osteoarthritis Of The Hand  10/1 IMO update  ? Osteoarthrosis, unspecified whether generalized or localized, pelvic region and thigh 07/25/2010  ? Formatting of this note might be different from the original. Osteoarthritis Of The Hip  ? Other abnormal glucose 07/22/2021  ? Other allergic rhinitis 02/18/2015  ? Other ill-defined and unknown causes of morbidity and mortality 09/12/1999  ? Nov 20, 2000 Entered By: Alycia Patten Comment: ltFeb 23, 2006 Entered By: Alycia Patten Comment: rt 11/05  ? Peripheral neuropathy 06/19/2016  ? PONV (postoperative nausea and vomiting)   ? Preventative health care 11/24/2015  ? Right groin pain 11/29/2012  ? Right inguinal hernia 05/13/2009  ? Qualifier: Diagnosis of  By: Burnett Kanaris    ? RLS (restless legs syndrome) 02/18/2015  ? Rosacea   ? Rotator cuff tear 07/27/2013  ? Situational depression 03/02/2021  ? Sleep apnea with use of continuous positive airway pressure (CPAP) 07/15/2013  ? CPAP set to  7 cm water,  Residual AHi was 7.8  With no leak.  User time 6 hours.  479 days , not used only 12 days - highly compliant 06-23-13  .   ? Spondylitis ([place]) 07/22/2021  ? Status post cervical spinal fusion 07/09/2020  ? 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 Tibialis anterior tendon tear, nontraumatic 11/13/2019  ? Trochanteric bursitis of left hip 06/30/2021  ? 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 Followed by [name]  ? Urinary retention 11/26/2020  ? Ventral hernia 07/08/2012  ? Vertigo 07/08/2021  ? Weight loss 03/03/2020  ? ? ?Past Surgical History:  ?Procedure Laterality Date  ? ABDOMINAL HERNIA REPAIR  07/15/12  ? Dr [name]  ? ANTERIOR CERVICAL DECOMP/DISCECTOMY FUSION N/A 07/09/2020  ? Procedure: ANTERIOR CERVICAL DECOMPRESSION AND FUSION CERVICAL THREE-FOUR.;  Surgeon: [name], MD;  Location: [place];  Service: Neurosurgery;  Laterality: N/A;  anterior  ? BROW LIFT  05/07/01  ? CYSTOSCOPY WITH  URETEROSCOPY AND STENT PLACEMENT Right 01/28/2014  ? Procedure: CYSTOSCOPY WITH URETEROSCOPY, BASKET RETRIVAL AND  STENT PLACEMENT;  Surgeon: [name], MD;  Location: [facility];  Service: Urology;  Laterality: Right;  ? epidural injections    ? multiple procedures  ? [place]  ? multiple times  ? EYE SURGERY Bilateral 03/25/10, 2012  ? cataract removal  ? HEMORRHOID SURGERY  2014  ? HIATAL HERNIA REPAIR  12/21/93  ? HOLMIUM LASER APPLICATION Right 4/62/7035  ? Procedure: HOLMIUM LASER APPLICATION;  Surgeon: [name], MD;  Location: [facility];  Service: Urology;  Laterality: Right;  ? INGUINAL HERNIA REPAIR Right 07/15/12  ? Dr [name], x2  ? JOINT REPLACEMENT  07/25/04  ? right knee  ? JOINT REPLACEMENT  07/13/10  ? left knee  ? KNEE SURGERY  1995  ? LAPAROSCOPIC CHOLECYSTECTOMY  09/20/06  ?  with intraoperative cholangiogram and right inguinal herniorrhaphy with mesh   ? LIGAMENT REPAIR Left 07/2013  ? shoulder  ? LITHOTRIPSY    ? x2  ? NASAL SEPTUM SURGERY  1975  ? POSTERIOR CERVICAL FUSION/FORAMINOTOMY N/A 11/24/2020  ? Procedure: Posterior Cervical Laminectomy Cervical three-four, Cervical four-five with lateral mass fusion;  Surgeon: [name], MD;  Location: [facility];  Service: Neurosurgery;  Laterality: N/A;  ? REFRACTIVE SURGERY Bilateral   ? Right total hip replacement  4/14  ? SKIN CANCER DESTRUCTION    ? nose, ear  ? TONSILLECTOMY  as child  ? TOTAL HIP ARTHROPLASTY Left   ? URETHRAL DILATION  1991  ? [name]  ? ? ?Family History  ?Problem Relation Age of Onset  ? Cancer Mother   ? Hypertension Other   ? Cancer Other   ? Osteoarthritis Other   ? ? ?Social History  ? ?Socioeconomic History  ? Marital status: Married  ?  Spouse name: Not on file  ? Number of children: 1  ? Years of education: Not on file  ? Highest education level: Not on file  ?Occupational History  ? Occupation: firefighter  ?  Comment: paints on the side  ?Tobacco Use  ? Smoking status: Former  ?  Years: 11.00  ?   Types: Cigarettes  ?  Quit date: 09/11/1978  ?  Years since quitting: 43.2  ? Smokeless tobacco: Never  ?Vaping Use  ? Vaping Use: Never used  ?Substance and Sexual Activity  ? Alcohol use: Not Currently  ?  Alc

## 2021-12-08 ENCOUNTER — Telehealth: Payer: Self-pay | Admitting: Family

## 2021-12-08 LAB — URINE CULTURE
MICRO NUMBER:: 13183313
SPECIMEN QUALITY:: ADEQUATE

## 2021-12-08 MED ORDER — AMPICILLIN 500 MG PO CAPS: 500.0000 mg | ORAL_CAPSULE | Freq: Four times a day (QID) | ORAL | 0 refills | Status: DC

## 2021-12-08 NOTE — Telephone Encounter (Signed)
Urine culture shows Enterococcus UTI.  Rx sent for ampicillin. Pt informed.   ?

## 2021-12-09 ENCOUNTER — Ambulatory Visit: Payer: Medicare HMO | Admitting: Physical Therapy

## 2021-12-09 DIAGNOSIS — R2681 Unsteadiness on feet: Secondary | ICD-10-CM | POA: Diagnosis not present

## 2021-12-09 DIAGNOSIS — R2689 Other abnormalities of gait and mobility: Secondary | ICD-10-CM | POA: Diagnosis not present

## 2021-12-09 DIAGNOSIS — M6281 Muscle weakness (generalized): Secondary | ICD-10-CM | POA: Diagnosis not present

## 2021-12-09 DIAGNOSIS — R29818 Other symptoms and signs involving the nervous system: Secondary | ICD-10-CM | POA: Diagnosis not present

## 2021-12-09 DIAGNOSIS — N401 Enlarged prostate with lower urinary tract symptoms: Secondary | ICD-10-CM | POA: Diagnosis not present

## 2021-12-09 DIAGNOSIS — H8111 Benign paroxysmal vertigo, right ear: Secondary | ICD-10-CM | POA: Diagnosis not present

## 2021-12-09 DIAGNOSIS — R42 Dizziness and giddiness: Secondary | ICD-10-CM | POA: Diagnosis not present

## 2021-12-09 DIAGNOSIS — N3941 Urge incontinence: Secondary | ICD-10-CM | POA: Diagnosis not present

## 2021-12-09 NOTE — Therapy (Signed)
[facility] ?[place] ?[place], [place], [address] ?Phone: [phone]   Fax:  [phone] ? ?Physical Therapy Treatment ? ?Patient Details  ?Name: LAYLA GRAMM ?MRN: [id number] ?Date of Birth: 1941-08-18 ?Referring Provider (PT): [name], NP ? ? ?Encounter Date: 12/09/2021 ? ? PT End of Session - 12/09/21 1433   ? ? Visit Number 14   ? Number of Visits 16   ? Date for PT Re-Evaluation 12/23/21   ? Authorization Type [name]   ? Authorization - Visit Number 14   ? Progress Note Due on Visit 10   ? PT Start Time 1435   ? PT Stop Time 1513   ? PT Time Calculation (min) 38 min   ? Equipment Utilized During Treatment Gait belt   ? Activity Tolerance Patient tolerated treatment well   ? Behavior During Therapy [facility] for tasks assessed/performed   ? ?  ?  ? ?  ? ? ?Past Medical History:  ?Diagnosis Date  ? Allergic rhinitis 02/02/2016  ? Anemia 12/17/2009  ? Formatting of this note might be different from the original. Anemia  10/1 IMO update  ? Aortic atherosclerosis ([place]) 03/05/2020  ? Arthritis   ? 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 Atypical chest pain 11/24/2015  ? Benign essential hypertension 05/13/2009  ? Qualifier: Diagnosis of  By: Larwance Sachs of this note might be different from the original. Hypertension  ? 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[place]) 07/22/2021  ? C. difficile colitis 04/11/2021  ? Cancer [facility])   ? skin cancer  ? Carotid stenosis   ? a. Carotid U/S 2/35: LICA < 36%, RICA 14-43%;  b.  Carotid U/S 1/54:  RICA 00-86%; LICA 7-61%; f/u 1 year  ? Cervical myelopathy ([place]) 11/24/2020  ? 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 Complication of anesthesia   ? "stomach does not wake up"  ? COPD (chronic obstructive pulmonary disease) ([place])   ? CPAP (continuous positive airway pressure) dependence 03/25/2018  ? 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 Cystoid macular edema of both eyes 07/28/2020   ? Degenerative disc disease, lumbar 06/30/2015  ? Deviated nasal septum 02/18/2015  ? Diverticulosis 03/03/2020  ? DJD (degenerative joint disease) of hip 12/24/2012  ? Dyspnea 06/12/2011  ? Emphysema, unspecified ([place]) 03/05/2020  ? Erectile dysfunction 06/20/2017  ? 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[place]) 02/09/2021  ? Gait disorder 03/02/2021  ? GERD (gastroesophageal reflux disease)   ? 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 Qualifier: Diagnosis of  By: Burnett Kanaris    ? Greater trochanteric pain syndrome 07/22/2021  ? Hearing loss 02/02/2016  ? Heme positive stool 02/29/2020  ? HIATAL HERNIA 05/13/2009  ? Qualifier: Diagnosis of  By: Burnett Kanaris    ? High risk medication use 01/18/2012  ? Hip pain 03/02/2021  ? History of chicken pox   ? History of hemorrhoids   ? History of kidney stones   ? History of total bilateral knee replacement 07/08/2020  ? Hyperglycemia 03/03/2014  ? Hyperlipidemia   ? under control  ? Hyperlipidemia with target LDL less than 130 12/27/2010  ? Formatting of this note might be different from the original. Cardiology - Dr Frances Nickels at [facility] cardiology  ICD-10 cut over  ? Hypertension   ? under control  ? Hypokalemia 07/08/2021  ? Hypothyroidism   ? Internal hemorrhoids 03/03/2020  ? Leg cramps 11/13/2019  ? Lower GI bleed 12/12/2012  ?  Lumbago of lumbar region with sciatica 10/21/2020  ? Lumbar radiculopathy 03/02/2021  ? Lumbar spondylosis 11/02/2020  ? Nasal congestion 04/11/2021  ? NEPHROLITHIASIS 05/13/2009  ? Qualifier: Diagnosis of  By: Burnett Kanaris    ? Nonspecific abnormal electrocardiogram (ECG) (EKG) 01/06/2013  ? Onychomycosis 06/30/2015  ? 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 Formatting of this note might be different from the original. Osteoarthritis Of The Hand  10/1 IMO update  ? Osteoarthrosis, unspecified whether generalized or localized, pelvic region and thigh 07/25/2010  ? Formatting of this note might be  different from the original. Osteoarthritis Of The Hip  ? Other abnormal glucose 07/22/2021  ? Other allergic rhinitis 02/18/2015  ? Other ill-defined and unknown causes of morbidity and mortality 09/12/1999  ? Nov 20, 2000 Entered By: Alycia Patten Comment: ltFeb 23, 2006 Entered By: Alycia Patten Comment: rt 11/05  ? Peripheral neuropathy 06/19/2016  ? PONV (postoperative nausea and vomiting)   ? Preventative health care 11/24/2015  ? Right groin pain 11/29/2012  ? Right inguinal hernia 05/13/2009  ? Qualifier: Diagnosis of  By: Burnett Kanaris    ? RLS (restless legs syndrome) 02/18/2015  ? Rosacea   ? Rotator cuff tear 07/27/2013  ? Situational depression 03/02/2021  ? Sleep apnea with use of continuous positive airway pressure (CPAP) 07/15/2013  ? CPAP set to  7 cm water,  Residual AHi was 7.8  With no leak.  User time 6 hours.  479 days , not used only 12 days - highly compliant 06-23-13  .   ? 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 Status post cervical spinal fusion 07/09/2020  ? 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 Tibialis anterior tendon tear, nontraumatic 11/13/2019  ? Trochanteric bursitis of left hip 06/30/2021  ? 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 Followed by [name]  ? Urinary retention 11/26/2020  ? Ventral hernia 07/08/2012  ? Vertigo 07/08/2021  ? Weight loss 03/03/2020  ? ? ?Past Surgical History:  ?Procedure Laterality Date  ? ABDOMINAL HERNIA REPAIR  07/15/12  ? Dr [name]  ? ANTERIOR CERVICAL DECOMP/DISCECTOMY FUSION N/A 07/09/2020  ? Procedure: ANTERIOR CERVICAL DECOMPRESSION AND FUSION CERVICAL THREE-FOUR.;  Surgeon: [name], MD;  Location: [place] [facility];  Service: Neurosurgery;  Laterality: N/A;  anterior  ? BROW LIFT  05/07/01  ? CYSTOSCOPY WITH URETEROSCOPY AND STENT PLACEMENT Right 01/28/2014  ? Procedure: CYSTOSCOPY WITH URETEROSCOPY, BASKET RETRIVAL AND  STENT PLACEMENT;  Surgeon: [name], MD;  Location[facility];  Service:  Urology;  Laterality: Right;  ? epidural injections    ? multiple procedures  ? [place]  ? multiple times  ? EYE SURGERY Bilateral 03/25/10, 2012  ? cataract removal  ? HEMORRHOID SURGERY  2014  ? HIATAL HERNIA REPAIR  12/21/93  ? HOLMIUM LASER APPLICATION Right 7/51/0258  ? Procedure: HOLMIUM LASER APPLICATION;  Surgeon: [name], MD;  Location[facility];  Service: Urology;  Laterality: Right;  ? INGUINAL HERNIA REPAIR Right 07/15/12  ? Dr [name], x2  ? JOINT REPLACEMENT  07/25/04  ? right knee  ? JOINT REPLACEMENT  07/13/10  ? left knee  ? KNEE SURGERY  1995  ? LAPAROSCOPIC CHOLECYSTECTOMY  09/20/06  ? with intraoperative cholangiogram and right inguinal herniorrhaphy with mesh   ? LIGAMENT REPAIR Left 07/2013  ? shoulder  ? LITHOTRIPSY    ? x2  ? NASAL SEPTUM SURGERY  1975  ? POSTERIOR CERVICAL FUSION/FORAMINOTOMY N/A  11/24/2020  ? Procedure: Posterior Cervical Laminectomy Cervical three-four, Cervical four-five with lateral mass fusion;  Surgeon: [name], MD;  Location: [facility];  Service: Neurosurgery;  Laterality: N/A;  ? REFRACTIVE SURGERY Bilateral   ? Right total hip replacement  4/14  ? SKIN CANCER DESTRUCTION    ? nose, ear  ? TONSILLECTOMY  as child  ? TOTAL HIP ARTHROPLASTY Left   ? URETHRAL DILATION  1991  ? [name]  ? ? ?There were no vitals filed for this visit. ? ? Subjective Assessment - 12/09/21 1438   ? ? Subjective Pt reports he is feeling tired (especially in the legs). He states he did a lot of yard work for his wife. No issues.   ? Pertinent History spondylitic myelopathy, ACDF, HTN, COPD, cervical decompression, sleep apnea   ? Patient Stated Goals "get a lot better."   ? Currently in Pain? No/denies   ? ?  ?  ? ?  ? ? ? ? ? [facility] PT Assessment - 12/09/21 0001   ? ?  ? Assessment  ? Medical Diagnosis R26.9 (ICD-10-CM) - Gait disorder  R42 (ICD-10-CM) - Vertigo   ? Referring Provider (PT) [name], NP   ? Onset Date/Surgical Date 08/27/21   ? Hand Dominance  Right   ? ?  ?  ? ?  ? ? ? ? ? ? ? ? ? ? ? ? ? ? ? ? [place] Adult PT Treatment/Exercise - 12/09/21 0001   ? ?  ? Knee/Hip Exercises: Stretches  ? Gastroc Stretch Right;Left;2 reps;20 seconds   ?  ? Knee/Hip Exer

## 2021-12-13 ENCOUNTER — Ambulatory Visit: Payer: Medicare HMO | Attending: Family | Admitting: Rehabilitative and Restorative Service Providers"

## 2021-12-13 DIAGNOSIS — H8111 Benign paroxysmal vertigo, right ear: Secondary | ICD-10-CM | POA: Diagnosis not present

## 2021-12-13 DIAGNOSIS — R29818 Other symptoms and signs involving the nervous system: Secondary | ICD-10-CM | POA: Diagnosis not present

## 2021-12-13 DIAGNOSIS — R2681 Unsteadiness on feet: Secondary | ICD-10-CM

## 2021-12-13 DIAGNOSIS — M66869 Spontaneous rupture of other tendons, unspecified lower leg: Secondary | ICD-10-CM | POA: Diagnosis not present

## 2021-12-13 DIAGNOSIS — M6281 Muscle weakness (generalized): Secondary | ICD-10-CM | POA: Diagnosis not present

## 2021-12-13 DIAGNOSIS — R2689 Other abnormalities of gait and mobility: Secondary | ICD-10-CM | POA: Diagnosis not present

## 2021-12-13 DIAGNOSIS — R42 Dizziness and giddiness: Secondary | ICD-10-CM | POA: Insufficient documentation

## 2021-12-13 NOTE — Therapy (Signed)
[place] [facility] ?[place] ?[place], [place], [address] ?Phone: [phone]   Fax:  [phone] ? ?Physical Therapy Treatment ? ?Patient Details  ?Name: [name] Pratt ?MRN: [id number] ?Date of Birth: [birth year] ?Referring Provider (PT): [name], NP ? ? ?Encounter Date: 12/13/2021 ? ? PT End of Session - 12/13/21 1017   ? ? Visit Number 15   ? Number of Visits 20   ? Date for PT Re-Evaluation 12/23/21   ? Authorization Type 8 VISITS AUTHORIZED 12/01/2021-12/27/2021 updated: 12/13/2021  [employer]   ? Authorization - Visit Number 15   ? Progress Note Due on Visit 20   ? PT Start Time 1016   ? PT Stop Time 1100   ? PT Time Calculation (min) 44 min   ? Equipment Utilized During Treatment Gait belt   ? Activity Tolerance Patient tolerated treatment well   ? Behavior During Therapy [facility] for tasks assessed/performed   ? ?  ?  ? ?  ? ? ?Past Medical History:  ?Diagnosis Date  ? Allergic rhinitis 02/02/2016  ? Anemia 12/17/2009  ? Formatting of this note might be different from the original. Anemia  10/1 IMO update  ? Aortic atherosclerosis ([place]) 03/05/2020  ? Arthritis   ? 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 Atypical chest pain 11/24/2015  ? Benign essential hypertension 05/13/2009  ? Qualifier: Diagnosis of  By: Larwance Sachs of this note might be different from the original. Hypertension  ? 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[place]) 07/22/2021  ? C. difficile colitis 04/11/2021  ? Cancer [employer])   ? skin cancer  ? Carotid stenosis   ? a. Carotid U/S 2/63: LICA < 33%, RICA 54-56%;  b.  Carotid U/S 2/56:  RICA 38-93%; LICA 7-34%; f/u 1 year  ? Cervical myelopathy ([place]) 11/24/2020  ? 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 Complication of anesthesia   ? "stomach does not wake up"  ? COPD (chronic obstructive pulmonary disease) ([place])   ? CPAP (continuous positive airway pressure) dependence 03/25/2018  ? 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 Cystoid macular edema of both eyes 07/28/2020  ? Degenerative disc disease, lumbar 06/30/2015  ? Deviated nasal septum 02/18/2015  ? Diverticulosis 03/03/2020  ? DJD (degenerative joint disease) of hip 12/24/2012  ? Dyspnea 06/12/2011  ? Emphysema, unspecified ([place]) 03/05/2020  ? Erectile dysfunction 06/20/2017  ? 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[place]) 02/09/2021  ? Gait disorder 03/02/2021  ? GERD (gastroesophageal reflux disease)   ? 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 Qualifier: Diagnosis of  By: Burnett Kanaris    ? Greater trochanteric pain syndrome 07/22/2021  ? Hearing loss 02/02/2016  ? Heme positive stool 02/29/2020  ? HIATAL HERNIA 05/13/2009  ? Qualifier: Diagnosis of  By: Burnett Kanaris    ? High risk medication use 01/18/2012  ? Hip pain 03/02/2021  ? History of chicken pox   ? History of hemorrhoids   ? History of kidney stones   ? History of total bilateral knee replacement 07/08/2020  ? Hyperglycemia 03/03/2014  ? Hyperlipidemia   ? under control  ? Hyperlipidemia with target LDL less than 130 12/27/2010  ? Formatting of this note might be different from the original. Cardiology - Dr Frances Nickels at [facility] cardiology  ICD-10 cut over  ? Hypertension   ? under control  ? Hypokalemia 07/08/2021  ? Hypothyroidism   ? Internal hemorrhoids 03/03/2020  ? Leg cramps 11/13/2019  ?  Lower GI bleed 12/12/2012  ? Lumbago of lumbar region with sciatica 10/21/2020  ? Lumbar radiculopathy 03/02/2021  ? Lumbar spondylosis 11/02/2020  ? Nasal congestion 04/11/2021  ? NEPHROLITHIASIS 05/13/2009  ? Qualifier: Diagnosis of  By: Burnett Kanaris    ? Nonspecific abnormal electrocardiogram (ECG) (EKG) 01/06/2013  ? Onychomycosis 06/30/2015  ? 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 Formatting of this note might be different from the original. Osteoarthritis Of The Hand  10/1 IMO update  ? Osteoarthrosis, unspecified whether generalized or localized, pelvic region  and thigh 07/25/2010  ? Formatting of this note might be different from the original. Osteoarthritis Of The Hip  ? Other abnormal glucose 07/22/2021  ? Other allergic rhinitis 02/18/2015  ? Other ill-defined and unknown causes of morbidity and mortality 09/12/1999  ? Nov 20, 2000 Entered By: Alycia Patten Comment: ltFeb 23, 2006 Entered By: Alycia Patten Comment: rt 11/05  ? Peripheral neuropathy 06/19/2016  ? PONV (postoperative nausea and vomiting)   ? Preventative health care 11/24/2015  ? Right groin pain 11/29/2012  ? Right inguinal hernia 05/13/2009  ? Qualifier: Diagnosis of  By: Burnett Kanaris    ? RLS (restless legs syndrome) 02/18/2015  ? Rosacea   ? Rotator cuff tear 07/27/2013  ? Situational depression 03/02/2021  ? Sleep apnea with use of continuous positive airway pressure (CPAP) 07/15/2013  ? CPAP set to  7 cm water,  Residual AHi was 7.8  With no leak.  User time 6 hours.  479 days , not used only 12 days - highly compliant 06-23-13  .   ? 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 Status post cervical spinal fusion 07/09/2020  ? 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 Tibialis anterior tendon tear, nontraumatic 11/13/2019  ? Trochanteric bursitis of left hip 06/30/2021  ? 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 Followed by [name]  ? Urinary retention 11/26/2020  ? Ventral hernia 07/08/2012  ? Vertigo 07/08/2021  ? Weight loss 03/03/2020  ? ? ?Past Surgical History:  ?Procedure Laterality Date  ? ABDOMINAL HERNIA REPAIR  07/15/12  ? Dr [name]  ? ANTERIOR CERVICAL DECOMP/DISCECTOMY FUSION N/A 07/09/2020  ? Procedure: ANTERIOR CERVICAL DECOMPRESSION AND FUSION CERVICAL THREE-FOUR.;  Surgeon: [name], MD;  Location: [place];  Service: Neurosurgery;  Laterality: N/A;  anterior  ? BROW LIFT  05/07/01  ? CYSTOSCOPY WITH URETEROSCOPY AND STENT PLACEMENT Right 01/28/2014  ? Procedure: CYSTOSCOPY WITH URETEROSCOPY, BASKET RETRIVAL AND  STENT PLACEMENT;   Surgeon: [name], MD;  Location: [facility];  Service: Urology;  Laterality: Right;  ? epidural injections    ? multiple procedures  ? [place]  ? multiple times  ? EYE SURGERY Bilateral 03/25/10, 2012  ? cataract removal  ? HEMORRHOID SURGERY  2014  ? HIATAL HERNIA REPAIR  12/21/93  ? HOLMIUM LASER APPLICATION Right 5/62/5638  ? Procedure: HOLMIUM LASER APPLICATION;  Surgeon: [name], MD;  Location[facility];  Service: Urology;  Laterality: Right;  ? INGUINAL HERNIA REPAIR Right 07/15/12  ? Dr [name], x2  ? JOINT REPLACEMENT  07/25/04  ? right knee  ? JOINT REPLACEMENT  07/13/10  ? left knee  ? KNEE SURGERY  1995  ? LAPAROSCOPIC CHOLECYSTECTOMY  09/20/06  ? with intraoperative cholangiogram and right inguinal herniorrhaphy with mesh   ? LIGAMENT REPAIR Left 07/2013  ? shoulder  ? LITHOTRIPSY    ? x2  ? NASAL SEPTUM SURGERY  1975  ?  POSTERIOR CERVICAL FUSION/FORAMINOTOMY N/A 11/24/2020  ? Procedure: Posterior Cervical Laminectomy Cervical three-four, Cervical four-five with lateral mass fusion;  Surgeon: [name], MD;  Location: [place];  Service: Neurosurgery;  Laterality: N/A;  ? REFRACTIVE SURGERY Bilateral   ? Right total hip replacement  4/14  ? SKIN CANCER DESTRUCTION    ? nose, ear  ? TONSILLECTOMY  as child  ? TOTAL HIP ARTHROPLASTY Left   ? URETHRAL DILATION  1991  ? [name]  ? ? ?There were no vitals filed for this visit. ? ? Subjective Assessment - 12/13/21 1306   ? ? Subjective The patient notes he is doing a lot of yard work recently.  He is not performing HEP as often due ot household chores/demands.   ? Pertinent History spondylitic myelopathy, ACDF, HTN, COPD, cervical decompression, sleep apnea   ? Patient Stated Goals "get a lot better."   ? Currently in Pain? No/denies   ? ?  ?  ? ?  ? ? ? ? ? [facility] PT Assessment - 12/13/21 1022   ? ?  ? Assessment  ? Medical Diagnosis R26.9 (ICD-10-CM) - Gait disorder  R42 (ICD-10-CM) - Vertigo   ? Referring Provider (PT) [name], NP   ? Onset Date/Surgical Date 08/27/21   ?  ? Berg Balance Test  ? Sit to Stand Able to stand without using hands and stabilize independently   ? Standing Unsupported Able to stand safely 2 minutes   ? Sit

## 2021-12-15 ENCOUNTER — Ambulatory Visit: Payer: Medicare HMO | Admitting: Physical Therapy

## 2021-12-15 DIAGNOSIS — R2689 Other abnormalities of gait and mobility: Secondary | ICD-10-CM | POA: Diagnosis not present

## 2021-12-15 DIAGNOSIS — H8111 Benign paroxysmal vertigo, right ear: Secondary | ICD-10-CM | POA: Diagnosis not present

## 2021-12-15 DIAGNOSIS — M6281 Muscle weakness (generalized): Secondary | ICD-10-CM | POA: Diagnosis not present

## 2021-12-15 DIAGNOSIS — M66869 Spontaneous rupture of other tendons, unspecified lower leg: Secondary | ICD-10-CM | POA: Diagnosis not present

## 2021-12-15 DIAGNOSIS — R2681 Unsteadiness on feet: Secondary | ICD-10-CM | POA: Diagnosis not present

## 2021-12-15 DIAGNOSIS — R29818 Other symptoms and signs involving the nervous system: Secondary | ICD-10-CM | POA: Diagnosis not present

## 2021-12-15 DIAGNOSIS — R42 Dizziness and giddiness: Secondary | ICD-10-CM | POA: Diagnosis not present

## 2021-12-15 NOTE — Therapy (Signed)
[place] [facility] ?[place], [place], [address] ?Phone: [phone]   Fax:  [phone] ? ?Physical Therapy Treatment ? ?Patient Details  ?Name: [name] Pratt ?MRN: [id number] ?Date of Birth: 1940-09-14 ?Referring Provider (PT): [name], NP ? ? ?Encounter Date: 12/15/2021 ? ? PT End of Session - 12/15/21 1534   ? ? Visit Number 16   ? Number of Visits 20   ? Date for PT Re-Evaluation 12/23/21   ? Authorization Type 8 VISITS AUTHORIZED 12/01/2021-12/27/2021 updated: 12/13/2021  [employer]   ? Authorization - Visit Number 16   ? Progress Note Due on Visit 20   ? PT Start Time 1533   ? PT Stop Time 1615   ? PT Time Calculation (min) 42 min   ? Equipment Utilized During Treatment Gait belt   ? Activity Tolerance Patient tolerated treatment well   ? Behavior During Therapy [facility] for tasks assessed/performed   ? ?  ?  ? ?  ? ? ?Past Medical History:  ?Diagnosis Date  ? Allergic rhinitis 02/02/2016  ? Anemia 12/17/2009  ? Formatting of this note might be different from the original. Anemia  10/1 IMO update  ? Aortic atherosclerosis ([place]) 03/05/2020  ? Arthritis   ? 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 Atypical chest pain 11/24/2015  ? Benign essential hypertension 05/13/2009  ? Qualifier: Diagnosis of  By: Larwance Sachs of this note might be different from the original. Hypertension  ? 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[place]) 07/22/2021  ? C. difficile colitis 04/11/2021  ? Cancer [facility])   ? skin cancer  ? Carotid stenosis   ? a. Carotid U/S 9/62: LICA < 83%, RICA 66-29%;  b.  Carotid U/S 4/76:  RICA 54-65%; LICA 0-35%; f/u 1 year  ? Cervical myelopathy ([place]) 11/24/2020  ? 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 Complication of anesthesia   ? "stomach does not wake up"  ? COPD (chronic obstructive pulmonary disease) ([place])   ? CPAP (continuous positive airway pressure) dependence 03/25/2018  ? 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 Cystoid macular edema of both eyes 07/28/2020  ? Degenerative disc disease, lumbar 06/30/2015  ? Deviated nasal septum 02/18/2015  ? Diverticulosis 03/03/2020  ? DJD (degenerative joint disease) of hip 12/24/2012  ? Dyspnea 06/12/2011  ? Emphysema, unspecified ([place]) 03/05/2020  ? Erectile dysfunction 06/20/2017  ? 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[place]) 02/09/2021  ? Gait disorder 03/02/2021  ? GERD (gastroesophageal reflux disease)   ? 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 Qualifier: Diagnosis of  By: Burnett Kanaris    ? Greater trochanteric pain syndrome 07/22/2021  ? Hearing loss 02/02/2016  ? Heme positive stool 02/29/2020  ? HIATAL HERNIA 05/13/2009  ? Qualifier: Diagnosis of  By: Burnett Kanaris    ? High risk medication use 01/18/2012  ? Hip pain 03/02/2021  ? History of chicken pox   ? History of hemorrhoids   ? History of kidney stones   ? History of total bilateral knee replacement 07/08/2020  ? Hyperglycemia 03/03/2014  ? Hyperlipidemia   ? under control  ? Hyperlipidemia with target LDL less than 130 12/27/2010  ? Formatting of this note might be different from the original. Cardiology - Dr Frances Nickels at [facility] cardiology  ICD-10 cut over  ? Hypertension   ? under control  ? Hypokalemia 07/08/2021  ? Hypothyroidism   ? Internal hemorrhoids 03/03/2020  ? Leg cramps 11/13/2019  ?  Lower GI bleed 12/12/2012  ? Lumbago of lumbar region with sciatica 10/21/2020  ? Lumbar radiculopathy 03/02/2021  ? Lumbar spondylosis 11/02/2020  ? Nasal congestion 04/11/2021  ? NEPHROLITHIASIS 05/13/2009  ? Qualifier: Diagnosis of  By: Burnett Kanaris    ? Nonspecific abnormal electrocardiogram (ECG) (EKG) 01/06/2013  ? Onychomycosis 06/30/2015  ? 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 Formatting of this note might be different from the original. Osteoarthritis Of The Hand  10/1 IMO update  ? Osteoarthrosis, unspecified whether generalized or localized, pelvic region  and thigh 07/25/2010  ? Formatting of this note might be different from the original. Osteoarthritis Of The Hip  ? Other abnormal glucose 07/22/2021  ? Other allergic rhinitis 02/18/2015  ? Other ill-defined and unknown causes of morbidity and mortality 09/12/1999  ? Nov 20, 2000 Entered By: Alycia Patten Comment: ltFeb 23, 2006 Entered By: Alycia Patten Comment: rt 11/05  ? Peripheral neuropathy 06/19/2016  ? PONV (postoperative nausea and vomiting)   ? Preventative health care 11/24/2015  ? Right groin pain 11/29/2012  ? Right inguinal hernia 05/13/2009  ? Qualifier: Diagnosis of  By: Burnett Kanaris    ? RLS (restless legs syndrome) 02/18/2015  ? Rosacea   ? Rotator cuff tear 07/27/2013  ? Situational depression 03/02/2021  ? Sleep apnea with use of continuous positive airway pressure (CPAP) 07/15/2013  ? CPAP set to  7 cm water,  Residual AHi was 7.8  With no leak.  User time 6 hours.  479 days , not used only 12 days - highly compliant 06-23-13  .   ? 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 Status post cervical spinal fusion 07/09/2020  ? 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 Tibialis anterior tendon tear, nontraumatic 11/13/2019  ? Trochanteric bursitis of left hip 06/30/2021  ? 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 Followed by [name]  ? Urinary retention 11/26/2020  ? Ventral hernia 07/08/2012  ? Vertigo 07/08/2021  ? Weight loss 03/03/2020  ? ? ?Past Surgical History:  ?Procedure Laterality Date  ? ABDOMINAL HERNIA REPAIR  07/15/12  ? Dr [name]  ? ANTERIOR CERVICAL DECOMP/DISCECTOMY FUSION N/A 07/09/2020  ? Procedure: ANTERIOR CERVICAL DECOMPRESSION AND FUSION CERVICAL THREE-FOUR.;  Surgeon: [name], MD;  Location: [place];  Service: Neurosurgery;  Laterality: N/A;  anterior  ? BROW LIFT  05/07/01  ? CYSTOSCOPY WITH URETEROSCOPY AND STENT PLACEMENT Right 01/28/2014  ? Procedure: CYSTOSCOPY WITH URETEROSCOPY, BASKET RETRIVAL AND  STENT PLACEMENT;   Surgeon: [name], MD;  [facility];  Service: Urology;  Laterality: Right;  ? epidural injections    ? multiple procedures  ? [place]  ? multiple times  ? EYE SURGERY Bilateral 03/25/10, 2012  ? cataract removal  ? HEMORRHOID SURGERY  2014  ? HIATAL HERNIA REPAIR  12/21/93  ? HOLMIUM LASER APPLICATION Right 6/64/4034  ? Procedure: HOLMIUM LASER APPLICATION;  Surgeon: [name], MD;  Location[facility];  Service: Urology;  Laterality: Right;  ? INGUINAL HERNIA REPAIR Right 07/15/12  ? Dr [name], x2  ? JOINT REPLACEMENT  07/25/04  ? right knee  ? JOINT REPLACEMENT  07/13/10  ? left knee  ? KNEE SURGERY  1995  ? LAPAROSCOPIC CHOLECYSTECTOMY  09/20/06  ? with intraoperative cholangiogram and right inguinal herniorrhaphy with mesh   ? LIGAMENT REPAIR Left 07/2013  ? shoulder  ? LITHOTRIPSY    ? x2  ? NASAL SEPTUM SURGERY  1975  ?  POSTERIOR CERVICAL FUSION/FORAMINOTOMY N/A 11/24/2020  ? Procedure: Posterior Cervical Laminectomy Cervical three-four, Cervical four-five with lateral mass fusion;  Surgeon: [name], MD;  Location: [place];  Service: Neurosurgery;  Laterality: N/A;  ? REFRACTIVE SURGERY Bilateral   ? Right total hip replacement  4/14  ? SKIN CANCER DESTRUCTION    ? nose, ear  ? TONSILLECTOMY  as child  ? TOTAL HIP ARTHROPLASTY Left   ? URETHRAL DILATION  1991  ? [name]  ? ? ?There were no vitals filed for this visit. ? ? Subjective Assessment - 12/15/21 1535   ? ? Subjective Pt reports spending ~4 hours stooping and putting in pavers. Pt feels no pain today but is feeling more stiff today.   ? Pertinent History spondylitic myelopathy, ACDF, HTN, COPD, cervical decompression, sleep apnea   ? Patient Stated Goals "get a lot better."   ? Currently in Pain? No/denies   ? ?  ?  ? ?  ? ? ? ? ? ? ? ? ? ? ? ? ? ? ? ? ? ? ? ? [place] [facility] PT Treatment/Exercise - 12/15/21 0001   ? ?  ? Knee/Hip Exercises: Stretches  ? Passive Hamstring Stretch Right;Left;30 seconds;2 reps   ? Hip  Flexor Stretch Left;Right;30 seconds;2 reps   ? Piriformis Stretch Right;Left;30 seconds;2 reps   ? Other Knee/Hip Stretches "[place]" 2x30 sec   ?  ? Knee/Hip Exercises: Supine  ? Short Arts administrator

## 2021-12-19 ENCOUNTER — Ambulatory Visit: Payer: Medicare HMO | Admitting: Family

## 2021-12-19 ENCOUNTER — Ambulatory Visit: Payer: Medicare HMO | Admitting: Medical

## 2021-12-20 ENCOUNTER — Ambulatory Visit: Payer: Medicare HMO | Admitting: Family

## 2021-12-21 ENCOUNTER — Ambulatory Visit: Payer: Medicare HMO | Admitting: Physical Therapy

## 2021-12-21 DIAGNOSIS — R42 Dizziness and giddiness: Secondary | ICD-10-CM | POA: Diagnosis not present

## 2021-12-21 DIAGNOSIS — M6281 Muscle weakness (generalized): Secondary | ICD-10-CM | POA: Diagnosis not present

## 2021-12-21 DIAGNOSIS — R2681 Unsteadiness on feet: Secondary | ICD-10-CM

## 2021-12-21 DIAGNOSIS — R29818 Other symptoms and signs involving the nervous system: Secondary | ICD-10-CM | POA: Diagnosis not present

## 2021-12-21 DIAGNOSIS — R2689 Other abnormalities of gait and mobility: Secondary | ICD-10-CM | POA: Diagnosis not present

## 2021-12-21 DIAGNOSIS — H8111 Benign paroxysmal vertigo, right ear: Secondary | ICD-10-CM | POA: Diagnosis not present

## 2021-12-21 DIAGNOSIS — M66869 Spontaneous rupture of other tendons, unspecified lower leg: Secondary | ICD-10-CM | POA: Diagnosis not present

## 2021-12-21 NOTE — Therapy (Signed)
[place] [facility] ?[place] ?[place], [place], [address] ?Phone: [phone]   Fax:  [phone] ? ?Physical Therapy Treatment ? ?Patient Details  ?Name: [name] Pratt ?MRN: [id number] ?Date of Birth: 1940-09-30 ?Referring Provider (PT): [name], NP ? ? ?Encounter Date: 12/21/2021 ? ? PT End of Session - 12/21/21 1352   ? ? Visit Number 17   ? Number of Visits 20   ? Date for PT Re-Evaluation 12/23/21   ? Authorization Type 8 VISITS AUTHORIZED 12/01/2021-12/27/2021 updated: 12/13/2021  [employer]   ? Authorization - Visit Number 17   ? Progress Note Due on Visit 20   ? PT Start Time 1345   ? PT Stop Time 1430   ? PT Time Calculation (min) 45 min   ? Equipment Utilized During Treatment Gait belt   ? Activity Tolerance Patient tolerated treatment well   ? Behavior During Therapy [facility] for tasks assessed/performed   ? ?  ?  ? ?  ? ? ?Past Medical History:  ?Diagnosis Date  ? Allergic rhinitis 02/02/2016  ? Anemia 12/17/2009  ? Formatting of this note might be different from the original. Anemia  10/1 IMO update  ? Aortic atherosclerosis ([place]) 03/05/2020  ? Arthritis   ? 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 Atypical chest pain 11/24/2015  ? Benign essential hypertension 05/13/2009  ? Qualifier: Diagnosis of  By: Larwance Sachs of this note might be different from the original. Hypertension  ? 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[place]) 07/22/2021  ? C. difficile colitis 04/11/2021  ? Cancer [facility])   ? skin cancer  ? Carotid stenosis   ? a. Carotid U/S 3/84: LICA < 66%, RICA 59-93%;  b.  Carotid U/S 5/70:  RICA 17-79%; LICA 3-90%; f/u 1 year  ? Cervical myelopathy ([place]) 11/24/2020  ? 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 Complication of anesthesia   ? "stomach does not wake up"  ? COPD (chronic obstructive pulmonary disease) ([place])   ? CPAP (continuous positive airway pressure) dependence 03/25/2018  ? 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 Cystoid macular edema of both eyes 07/28/2020  ? Degenerative disc disease, lumbar 06/30/2015  ? Deviated nasal septum 02/18/2015  ? Diverticulosis 03/03/2020  ? DJD (degenerative joint disease) of hip 12/24/2012  ? Dyspnea 06/12/2011  ? Emphysema, unspecified ([place]) 03/05/2020  ? Erectile dysfunction 06/20/2017  ? 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[place]) 02/09/2021  ? Gait disorder 03/02/2021  ? GERD (gastroesophageal reflux disease)   ? 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 Qualifier: Diagnosis of  By: Burnett Kanaris    ? Greater trochanteric pain syndrome 07/22/2021  ? Hearing loss 02/02/2016  ? Heme positive stool 02/29/2020  ? HIATAL HERNIA 05/13/2009  ? Qualifier: Diagnosis of  By: Burnett Kanaris    ? High risk medication use 01/18/2012  ? Hip pain 03/02/2021  ? History of chicken pox   ? History of hemorrhoids   ? History of kidney stones   ? History of total bilateral knee replacement 07/08/2020  ? Hyperglycemia 03/03/2014  ? Hyperlipidemia   ? under control  ? Hyperlipidemia with target LDL less than 130 12/27/2010  ? Formatting of this note might be different from the original. Cardiology - Dr Frances Nickels at [facility] cardiology  ICD-10 cut over  ? Hypertension   ? under control  ? Hypokalemia 07/08/2021  ? Hypothyroidism   ? Internal hemorrhoids 03/03/2020  ? Leg cramps 11/13/2019  ?  Lower GI bleed 12/12/2012  ? Lumbago of lumbar region with sciatica 10/21/2020  ? Lumbar radiculopathy 03/02/2021  ? Lumbar spondylosis 11/02/2020  ? Nasal congestion 04/11/2021  ? NEPHROLITHIASIS 05/13/2009  ? Qualifier: Diagnosis of  By: Burnett Kanaris    ? Nonspecific abnormal electrocardiogram (ECG) (EKG) 01/06/2013  ? Onychomycosis 06/30/2015  ? 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 Formatting of this note might be different from the original. Osteoarthritis Of The Hand  10/1 IMO update  ? Osteoarthrosis, unspecified whether generalized or localized, pelvic region  and thigh 07/25/2010  ? Formatting of this note might be different from the original. Osteoarthritis Of The Hip  ? Other abnormal glucose 07/22/2021  ? Other allergic rhinitis 02/18/2015  ? Other ill-defined and unknown causes of morbidity and mortality 09/12/1999  ? Nov 20, 2000 Entered By: Alycia Patten Comment: ltFeb 23, 2006 Entered By: Alycia Patten Comment: rt 11/05  ? Peripheral neuropathy 06/19/2016  ? PONV (postoperative nausea and vomiting)   ? Preventative health care 11/24/2015  ? Right groin pain 11/29/2012  ? Right inguinal hernia 05/13/2009  ? Qualifier: Diagnosis of  By: Burnett Kanaris    ? RLS (restless legs syndrome) 02/18/2015  ? Rosacea   ? Rotator cuff tear 07/27/2013  ? Situational depression 03/02/2021  ? Sleep apnea with use of continuous positive airway pressure (CPAP) 07/15/2013  ? CPAP set to  7 cm water,  Residual AHi was 7.8  With no leak.  User time 6 hours.  479 days , not used only 12 days - highly compliant 06-23-13  .   ? 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 Status post cervical spinal fusion 07/09/2020  ? 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 Tibialis anterior tendon tear, nontraumatic 11/13/2019  ? Trochanteric bursitis of left hip 06/30/2021  ? 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 Followed by [name]  ? Urinary retention 11/26/2020  ? Ventral hernia 07/08/2012  ? Vertigo 07/08/2021  ? Weight loss 03/03/2020  ? ? ?Past Surgical History:  ?Procedure Laterality Date  ? ABDOMINAL HERNIA REPAIR  07/15/12  ? Dr [name]  ? ANTERIOR CERVICAL DECOMP/DISCECTOMY FUSION N/A 07/09/2020  ? Procedure: ANTERIOR CERVICAL DECOMPRESSION AND FUSION CERVICAL THREE-FOUR.;  Surgeon: [name], MD;  Location: [place];  Service: Neurosurgery;  Laterality: N/A;  anterior  ? BROW LIFT  05/07/01  ? CYSTOSCOPY WITH URETEROSCOPY AND STENT PLACEMENT Right 01/28/2014  ? Procedure: CYSTOSCOPY WITH URETEROSCOPY, BASKET RETRIVAL AND  STENT PLACEMENT;   Surgeon: [name], MD;  Location: [facility];  Service: Urology;  Laterality: Right;  ? epidural injections    ? multiple procedures  ? [place]  ? multiple times  ? EYE SURGERY Bilateral 03/25/10, 2012  ? cataract removal  ? HEMORRHOID SURGERY  2014  ? HIATAL HERNIA REPAIR  12/21/93  ? HOLMIUM LASER APPLICATION Right 6/81/1572  ? Procedure: HOLMIUM LASER APPLICATION;  Surgeon: [name], MD;  Location: [facility];  Service: Urology;  Laterality: Right;  ? INGUINAL HERNIA REPAIR Right 07/15/12  ? Dr [name], x2  ? JOINT REPLACEMENT  07/25/04  ? right knee  ? JOINT REPLACEMENT  07/13/10  ? left knee  ? KNEE SURGERY  1995  ? LAPAROSCOPIC CHOLECYSTECTOMY  09/20/06  ? with intraoperative cholangiogram and right inguinal herniorrhaphy with mesh   ? LIGAMENT REPAIR Left 07/2013  ? shoulder  ? LITHOTRIPSY    ? x2  ? NASAL SEPTUM SURGERY  1975  ?  POSTERIOR CERVICAL FUSION/FORAMINOTOMY N/A 11/24/2020  ? Procedure: Posterior Cervical Laminectomy Cervical three-four, Cervical four-five with lateral mass fusion;  Surgeon: [name], MD;  Location: [place];  Service: Neurosurgery;  Laterality: N/A;  ? REFRACTIVE SURGERY Bilateral   ? Right total hip replacement  4/14  ? SKIN CANCER DESTRUCTION    ? nose, ear  ? TONSILLECTOMY  as child  ? TOTAL HIP ARTHROPLASTY Left   ? URETHRAL DILATION  1991  ? [name]  ? ? ?There were no vitals filed for this visit. ? ? Subjective Assessment - 12/21/21 1425   ? ? Subjective Pt notes increased time in the yard again the last 2 days. Notes his legs just feel weak but no soreness or pain.   ? Pertinent History spondylitic myelopathy, ACDF, HTN, COPD, cervical decompression, sleep apnea   ? Patient Stated Goals "get a lot better."   ? Currently in Pain? No/denies   ? ?  ?  ? ?  ? ? ? ? ? [facility] PT Assessment - 12/21/21 0001   ? ?  ? Assessment  ? Medical Diagnosis R26.9 (ICD-10-CM) - Gait disorder  R42 (ICD-10-CM) - Vertigo   ? Referring Provider (PT) [name], NP    ? Onset Date/Surgical Date 08/27/21   ? Hand Dominance Right   ? ?  ?  ? ?  ? ? ? ? ? ? ? ? ? ? ? ? ? ? ? ? [place] Adult PT Treatment/Exercise - 12/21/21 0001   ? ?  ? Knee/Hip Exercises: Stretches  ? Quad Stretch Ri

## 2021-12-23 ENCOUNTER — Ambulatory Visit: Payer: Medicare HMO | Admitting: Physical Therapy

## 2021-12-23 DIAGNOSIS — R29818 Other symptoms and signs involving the nervous system: Secondary | ICD-10-CM | POA: Diagnosis not present

## 2021-12-23 DIAGNOSIS — M6281 Muscle weakness (generalized): Secondary | ICD-10-CM | POA: Diagnosis not present

## 2021-12-23 DIAGNOSIS — R42 Dizziness and giddiness: Secondary | ICD-10-CM | POA: Diagnosis not present

## 2021-12-23 DIAGNOSIS — M66869 Spontaneous rupture of other tendons, unspecified lower leg: Secondary | ICD-10-CM | POA: Diagnosis not present

## 2021-12-23 DIAGNOSIS — H8111 Benign paroxysmal vertigo, right ear: Secondary | ICD-10-CM | POA: Diagnosis not present

## 2021-12-23 DIAGNOSIS — R2689 Other abnormalities of gait and mobility: Secondary | ICD-10-CM

## 2021-12-23 DIAGNOSIS — R2681 Unsteadiness on feet: Secondary | ICD-10-CM | POA: Diagnosis not present

## 2021-12-23 NOTE — Therapy (Signed)
[place] ?[facility] ?[place] ?[place], [place], [address] ?Phone: [phone]   Fax:  [phone] ? ?Physical Therapy Treatment ? ?Patient Details  ?Name: [name] Pratt ?MRN: [id number] ?Date of Birth: 24-Feb-1941 ?Referring Provider (PT): [name], NP ? ? ?Encounter Date: 12/23/2021 ? ? PT End of Session - 12/23/21 1314   ? ? Visit Number 18   ? Number of Visits 20   ? Date for PT Re-Evaluation 12/23/21   ? Authorization Type 8 VISITS AUTHORIZED 12/01/2021-12/27/2021 updated: 12/13/2021  [employer]   ? Authorization - Visit Number 18   ? Progress Note Due on Visit 20   ? PT Start Time 1315   ? PT Stop Time 1400   ? PT Time Calculation (min) 45 min   ? Equipment Utilized During Treatment Gait belt   ? Activity Tolerance Patient tolerated treatment well   ? Behavior During Therapy [facility] for tasks assessed/performed   ? ?  ?  ? ?  ? ? ?Past Medical History:  ?Diagnosis Date  ? Allergic rhinitis 02/02/2016  ? Anemia 12/17/2009  ? Formatting of this note might be different from the original. Anemia  10/1 IMO update  ? Aortic atherosclerosis ([place]) 03/05/2020  ? Arthritis   ? 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 Atypical chest pain 11/24/2015  ? Benign essential hypertension 05/13/2009  ? Qualifier: Diagnosis of  By: Larwance Sachs of this note might be different from the original. Hypertension  ? 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[place]) 07/22/2021  ? C. difficile colitis 04/11/2021  ? Cancer [facility])   ? skin cancer  ? Carotid stenosis   ? a. Carotid U/S 2/75: LICA < 17%, RICA 00-17%;  b.  Carotid U/S 4/94:  RICA 49-67%; LICA 5-91%; f/u 1 year  ? Cervical myelopathy ([place]) 11/24/2020  ? 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 Complication of anesthesia   ? "stomach does not wake up"  ? COPD (chronic obstructive pulmonary disease) ([place])   ? CPAP (continuous positive airway pressure) dependence 03/25/2018  ? 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 Cystoid macular edema of both eyes 07/28/2020  ? Degenerative disc disease, lumbar 06/30/2015  ? Deviated nasal septum 02/18/2015  ? Diverticulosis 03/03/2020  ? DJD (degenerative joint disease) of hip 12/24/2012  ? Dyspnea 06/12/2011  ? Emphysema, unspecified ([place]) 03/05/2020  ? Erectile dysfunction 06/20/2017  ? 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[place]) 02/09/2021  ? Gait disorder 03/02/2021  ? GERD (gastroesophageal reflux disease)   ? 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 Qualifier: Diagnosis of  By: Burnett Kanaris    ? Greater trochanteric pain syndrome 07/22/2021  ? Hearing loss 02/02/2016  ? Heme positive stool 02/29/2020  ? HIATAL HERNIA 05/13/2009  ? Qualifier: Diagnosis of  By: Burnett Kanaris    ? High risk medication use 01/18/2012  ? Hip pain 03/02/2021  ? History of chicken pox   ? History of hemorrhoids   ? History of kidney stones   ? History of total bilateral knee replacement 07/08/2020  ? Hyperglycemia 03/03/2014  ? Hyperlipidemia   ? under control  ? Hyperlipidemia with target LDL less than 130 12/27/2010  ? Formatting of this note might be different from the original. Cardiology - Dr Frances Nickels at [facility] cardiology  ICD-10 cut over  ? Hypertension   ? under control  ? Hypokalemia 07/08/2021  ? Hypothyroidism   ? Internal hemorrhoids 03/03/2020  ? Leg cramps 11/13/2019  ?  Lower GI bleed 12/12/2012  ? Lumbago of lumbar region with sciatica 10/21/2020  ? Lumbar radiculopathy 03/02/2021  ? Lumbar spondylosis 11/02/2020  ? Nasal congestion 04/11/2021  ? NEPHROLITHIASIS 05/13/2009  ? Qualifier: Diagnosis of  By: Burnett Kanaris    ? Nonspecific abnormal electrocardiogram (ECG) (EKG) 01/06/2013  ? Onychomycosis 06/30/2015  ? 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 Formatting of this note might be different from the original. Osteoarthritis Of The Hand  10/1 IMO update  ? Osteoarthrosis, unspecified whether generalized or localized, pelvic region  and thigh 07/25/2010  ? Formatting of this note might be different from the original. Osteoarthritis Of The Hip  ? Other abnormal glucose 07/22/2021  ? Other allergic rhinitis 02/18/2015  ? Other ill-defined and unknown causes of morbidity and mortality 09/12/1999  ? Nov 20, 2000 Entered By: Alycia Patten Comment: ltFeb 23, 2006 Entered By: Alycia Patten Comment: rt 11/05  ? Peripheral neuropathy 06/19/2016  ? PONV (postoperative nausea and vomiting)   ? Preventative health care 11/24/2015  ? Right groin pain 11/29/2012  ? Right inguinal hernia 05/13/2009  ? Qualifier: Diagnosis of  By: Burnett Kanaris    ? RLS (restless legs syndrome) 02/18/2015  ? Rosacea   ? Rotator cuff tear 07/27/2013  ? Situational depression 03/02/2021  ? Sleep apnea with use of continuous positive airway pressure (CPAP) 07/15/2013  ? CPAP set to  7 cm water,  Residual AHi was 7.8  With no leak.  User time 6 hours.  479 days , not used only 12 days - highly compliant 06-23-13  .   ? 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 Status post cervical spinal fusion 07/09/2020  ? 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 Tibialis anterior tendon tear, nontraumatic 11/13/2019  ? Trochanteric bursitis of left hip 06/30/2021  ? 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 Followed by [name]  ? Urinary retention 11/26/2020  ? Ventral hernia 07/08/2012  ? Vertigo 07/08/2021  ? Weight loss 03/03/2020  ? ? ?Past Surgical History:  ?Procedure Laterality Date  ? ABDOMINAL HERNIA REPAIR  07/15/12  ? Dr [name]  ? ANTERIOR CERVICAL DECOMP/DISCECTOMY FUSION N/A 07/09/2020  ? Procedure: ANTERIOR CERVICAL DECOMPRESSION AND FUSION CERVICAL THREE-FOUR.;  Surgeon: [name], MD;  Location: [place];  Service: Neurosurgery;  Laterality: N/A;  anterior  ? BROW LIFT  05/07/01  ? CYSTOSCOPY WITH URETEROSCOPY AND STENT PLACEMENT Right 01/28/2014  ? Procedure: CYSTOSCOPY WITH URETEROSCOPY, BASKET RETRIVAL AND  STENT PLACEMENT;   Surgeon: [name], MD;  Location[facility];  Service: Urology;  Laterality: Right;  ? epidural injections    ? multiple procedures  ? [place]  ? multiple times  ? EYE SURGERY Bilateral 03/25/10, 2012  ? cataract removal  ? HEMORRHOID SURGERY  2014  ? HIATAL HERNIA REPAIR  12/21/93  ? HOLMIUM LASER APPLICATION Right 1/61/0960  ? Procedure: HOLMIUM LASER APPLICATION;  Surgeon: [name], MD;  Location[facility];  Service: Urology;  Laterality: Right;  ? INGUINAL HERNIA REPAIR Right 07/15/12  ? Dr [name], x2  ? JOINT REPLACEMENT  07/25/04  ? right knee  ? JOINT REPLACEMENT  07/13/10  ? left knee  ? KNEE SURGERY  1995  ? LAPAROSCOPIC CHOLECYSTECTOMY  09/20/06  ? with intraoperative cholangiogram and right inguinal herniorrhaphy with mesh   ? LIGAMENT REPAIR Left 07/2013  ? shoulder  ? LITHOTRIPSY    ? x2  ? NASAL SEPTUM SURGERY  1975  ?  POSTERIOR CERVICAL FUSION/FORAMINOTOMY N/A 11/24/2020  ? Procedure: Posterior Cervical Laminectomy Cervical three-four, Cervical four-five with lateral mass fusion;  Surgeon: [name], MD;  Location: [place];  Service: Neurosurgery;  Laterality: N/A;  ? REFRACTIVE SURGERY Bilateral   ? Right total hip replacement  4/14  ? SKIN CANCER DESTRUCTION    ? nose, ear  ? TONSILLECTOMY  as child  ? TOTAL HIP ARTHROPLASTY Left   ? URETHRAL DILATION  1991  ? [name]  ? ? ?There were no vitals filed for this visit. ? ? Subjective Assessment - 12/23/21 1317   ? ? Subjective Pt is to see Dr for his carpal tunnel next Tuesday. Pt states he has been able to decrease time in the yard. He does note doing some other work bent over. He reports it was sore in his back but is feeling better now.   ? Pertinent History spondylitic myelopathy, ACDF, HTN, COPD, cervical decompression, sleep apnea   ? Patient Stated Goals "get a lot better."   ? Currently in Pain? No/denies   ? ?  ?  ? ?  ? ? ? ? ? [facility] PT Assessment - 12/23/21 0001   ? ?  ? Assessment  ? Medical Diagnosis R26.9  (ICD-10-CM) - Gait disorder  R42 (ICD-10-CM) - Vertigo   ? Referring Provider (PT) [name], NP   ? Onset Date/Surgical Date 08/27/21   ? Hand Dominance Right   ?  ? Transfers  ? Five time sit to stand comment

## 2021-12-27 ENCOUNTER — Encounter: Payer: Medicare HMO | Admitting: Rehabilitative and Restorative Service Providers"

## 2021-12-27 DIAGNOSIS — G5600 Carpal tunnel syndrome, unspecified upper limb: Secondary | ICD-10-CM | POA: Diagnosis not present

## 2021-12-28 ENCOUNTER — Ambulatory Visit: Payer: Medicare HMO | Admitting: Physical Therapy

## 2021-12-28 DIAGNOSIS — R2689 Other abnormalities of gait and mobility: Secondary | ICD-10-CM

## 2021-12-28 DIAGNOSIS — H8111 Benign paroxysmal vertigo, right ear: Secondary | ICD-10-CM

## 2021-12-28 DIAGNOSIS — R29818 Other symptoms and signs involving the nervous system: Secondary | ICD-10-CM

## 2021-12-28 DIAGNOSIS — M66869 Spontaneous rupture of other tendons, unspecified lower leg: Secondary | ICD-10-CM

## 2021-12-28 DIAGNOSIS — M6281 Muscle weakness (generalized): Secondary | ICD-10-CM

## 2021-12-28 DIAGNOSIS — R2681 Unsteadiness on feet: Secondary | ICD-10-CM

## 2021-12-28 DIAGNOSIS — R42 Dizziness and giddiness: Secondary | ICD-10-CM

## 2021-12-28 NOTE — Therapy (Signed)
[facility] ?[address] ?[place], [place], [address] ?Phone: [phone]   Fax:  [phone] ? ?Physical Therapy Treatment and Discharge ? ?Patient Details  ?Name: [name] Pratt ?MRN: [id number] ?Date of Birth: Nov 11, 1940 ?Referring Provider (PT): [name], NP ? ?PHYSICAL THERAPY DISCHARGE SUMMARY ? ?Visits from Start of Care: 19 ? ?Current functional level related to goals / functional outcomes: ?See below ?  ?Remaining deficits: ?See below ?  ?Education / Equipment: ?See below  ? ?Patient agrees to discharge. Patient goals were met. Patient is being discharged due to meeting the stated rehab goals. ? ? ?Encounter Date: 12/28/2021 ? ? PT End of Session - 12/28/21 1024   ? ? Visit Number 19   ? Number of Visits 20   ? Date for PT Re-Evaluation 12/23/21   ? Authorization Type 8 VISITS AUTHORIZED 12/01/2021-12/27/2021 updated: 12/13/2021  [employer]   ? Authorization - Visit Number 19   ? Progress Note Due on Visit 20   ? PT Start Time 1025   ? PT Stop Time 1110   ? PT Time Calculation (min) 45 min   ? Equipment Utilized During Treatment Gait belt   ? Activity Tolerance Patient tolerated treatment well   ? Behavior During Therapy [facility] for tasks assessed/performed   ? ?  ?  ? ?  ? ? ?Past Medical History:  ?Diagnosis Date  ? Allergic rhinitis 02/02/2016  ? Anemia 12/17/2009  ? Formatting of this note might be different from the original. Anemia  10/1 IMO update  ? Aortic atherosclerosis ([place]) 03/05/2020  ? Arthritis   ? 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 Atypical chest pain 11/24/2015  ? Benign essential hypertension 05/13/2009  ? Qualifier: Diagnosis of  By: Larwance Sachs of this note might be different from the original. Hypertension  ? 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[place]) 07/22/2021  ? C. difficile colitis 04/11/2021  ? Cancer [facility])   ? skin cancer  ? Carotid stenosis   ? a. Carotid U/S 2/95: LICA < 18%, RICA  84-16%;  b.  Carotid U/S 6/06:  RICA 30-16%; LICA 0-10%; f/u 1 year  ? Cervical myelopathy ([place]) 11/24/2020  ? 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 Complication of anesthesia   ? "stomach does not wake up"  ? COPD (chronic obstructive pulmonary disease) ([place])   ? CPAP (continuous positive airway pressure) dependence 03/25/2018  ? 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 Cystoid macular edema of both eyes 07/28/2020  ? Degenerative disc disease, lumbar 06/30/2015  ? Deviated nasal septum 02/18/2015  ? Diverticulosis 03/03/2020  ? DJD (degenerative joint disease) of hip 12/24/2012  ? Dyspnea 06/12/2011  ? Emphysema, unspecified ([place]) 03/05/2020  ? Erectile dysfunction 06/20/2017  ? 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[place]) 02/09/2021  ? Gait disorder 03/02/2021  ? GERD (gastroesophageal reflux disease)   ? 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 Qualifier: Diagnosis of  By: Burnett Kanaris    ? Greater trochanteric pain syndrome 07/22/2021  ? Hearing loss 02/02/2016  ? Heme positive stool 02/29/2020  ? HIATAL HERNIA 05/13/2009  ? Qualifier: Diagnosis of  By: Burnett Kanaris    ? High risk medication use 01/18/2012  ? Hip pain 03/02/2021  ? History of chicken pox   ? History of hemorrhoids   ? History of kidney stones   ? History of total bilateral knee replacement 07/08/2020  ? Hyperglycemia 03/03/2014  ? Hyperlipidemia   ? under  control  ? Hyperlipidemia with target LDL less than 130 12/27/2010  ? Formatting of this note might be different from the original. Cardiology - Dr Frances Nickels at [facility] cardiology  ICD-10 cut over  ? Hypertension   ? under control  ? Hypokalemia 07/08/2021  ? Hypothyroidism   ? Internal hemorrhoids 03/03/2020  ? Leg cramps 11/13/2019  ? Lower GI bleed 12/12/2012  ? Lumbago of lumbar region with sciatica 10/21/2020  ? Lumbar radiculopathy 03/02/2021  ? Lumbar spondylosis 11/02/2020  ? Nasal congestion 04/11/2021  ? NEPHROLITHIASIS 05/13/2009  ?  Qualifier: Diagnosis of  By: Burnett Kanaris    ? Nonspecific abnormal electrocardiogram (ECG) (EKG) 01/06/2013  ? Onychomycosis 06/30/2015  ? 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 Formatting of this note might be different from the original. Osteoarthritis Of The Hand  10/1 IMO update  ? Osteoarthrosis, unspecified whether generalized or localized, pelvic region and thigh 07/25/2010  ? Formatting of this note might be different from the original. Osteoarthritis Of The Hip  ? Other abnormal glucose 07/22/2021  ? Other allergic rhinitis 02/18/2015  ? Other ill-defined and unknown causes of morbidity and mortality 09/12/1999  ? Nov 20, 2000 Entered By: Alycia Patten Comment: ltFeb 23, 2006 Entered By: Alycia Patten Comment: rt 11/05  ? Peripheral neuropathy 06/19/2016  ? PONV (postoperative nausea and vomiting)   ? Preventative health care 11/24/2015  ? Right groin pain 11/29/2012  ? Right inguinal hernia 05/13/2009  ? Qualifier: Diagnosis of  By: Burnett Kanaris    ? RLS (restless legs syndrome) 02/18/2015  ? Rosacea   ? Rotator cuff tear 07/27/2013  ? Situational depression 03/02/2021  ? Sleep apnea with use of continuous positive airway pressure (CPAP) 07/15/2013  ? CPAP set to  7 cm water,  Residual AHi was 7.8  With no leak.  User time 6 hours.  479 days , not used only 12 days - highly compliant 06-23-13  .   ? 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 Status post cervical spinal fusion 07/09/2020  ? 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 Tibialis anterior tendon tear, nontraumatic 11/13/2019  ? Trochanteric bursitis of left hip 06/30/2021  ? 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 Followed by [name]  ? Urinary retention 11/26/2020  ? Ventral hernia 07/08/2012  ? Vertigo 07/08/2021  ? Weight loss 03/03/2020  ? ? ?Past Surgical History:  ?Procedure Laterality Date  ? ABDOMINAL HERNIA REPAIR  07/15/12  ? Dr [name]  ? ANTERIOR CERVICAL DECOMP/DISCECTOMY FUSION N/A  07/09/2020  ? Procedure: ANTERIOR CERVICAL DECOMPRESSION AND FUSION CERVICAL THREE-FOUR.;  Surgeon: [name], MD;  Location: [place];  Service: Neurosurgery;  Laterality: N/A;  anterior  ? BROW LIFT  05/07/01  ? CYSTOSCOPY WITH URETEROSCOPY AND STENT PLACEMENT Right 01/28/2014  ? Procedure: CYSTOSCOPY WITH URETEROSCOPY, BASKET RETRIVAL AND  STENT PLACEMENT;  Surgeon: [name], MD;  Location[facility];  Service: Urology;  Laterality: Right;  ? epidural injections    ? multiple procedures  ? [place]  ? multiple times  ? EYE SURGERY Bilateral 03/25/10, 2012  ? cataract removal  ? HEMORRHOID SURGERY  2014  ? HIATAL HERNIA REPAIR  12/21/93  ? HOLMIUM LASER APPLICATION Right 2/69/4854  ? Procedure: HOLMIUM LASER APPLICATION;  Surgeon: [name], MD;  [facility];  Service: Urology;  Laterality: Right;  ? INGUINAL HERNIA REPAIR Right 07/15/12  ? Dr [name], x2  ? JOINT REPLACEMENT  07/25/04  ?  right knee  ? JOINT REPLACEMENT  07/13/10  ? left knee  ? KNEE SURGERY  1995  ? LAPAROSCOPIC CHOLECYSTECTOMY  09/20/06  ? with intraoperative cholangiogram and right inguinal herniorrhaphy with mesh   ? LIGAMENT REPAIR Left 07/2013  ? shoulder  ? LITHOTRIPSY    ? x2  ? NASAL SEPTUM SURGERY  1975  ? POSTERIOR CERVICAL FUSION/FORAMINOTOMY N/A 11/24/2020  ? Procedure: Posterior Cervical Laminectomy Cervical three-four, Cervical four-five with lateral mass fusion;  Surgeon: [name], MD;  Location: [place];  Service: Neurosurgery;  Laterality: N/A;  ? REFRACTIVE SURGERY Bilateral   ? Right total hip replacement  4/14  ? SKIN CANCER DESTRUCTION    ? nose, ear  ? TONSILLECTOMY  as child  ? TOTAL HIP ARTHROPLASTY Left   ? URETHRAL DILATION  1991  ? [name]  ? ? ?There were no vitals filed for this visit. ? ? Subjective Assessment - 12/28/21 1136   ? ? Subjective Pt reports LOB when attempting to practice his balance exercise while walking towards his cane. States he was able to catch himself but required  increased time.   ? Pertinent History spondylitic myelopathy, ACDF, HTN, COPD, cervical decompression, sleep apnea   ? Patient Stated Goals "get a lot better."   ? Currently in Pain? No/denies   ? ?  ?  ? ?  ? ? ?

## 2021-12-30 ENCOUNTER — Encounter: Payer: Medicare HMO | Admitting: Physical Therapy

## 2022-01-03 ENCOUNTER — Encounter: Payer: Medicare HMO | Admitting: Rehabilitative and Restorative Service Providers"

## 2022-01-06 ENCOUNTER — Other Ambulatory Visit: Payer: Self-pay | Admitting: Family

## 2022-01-06 ENCOUNTER — Encounter: Payer: Medicare HMO | Admitting: Physical Therapy

## 2022-01-11 DIAGNOSIS — G5600 Carpal tunnel syndrome, unspecified upper limb: Secondary | ICD-10-CM | POA: Diagnosis not present

## 2022-01-11 DIAGNOSIS — G5602 Carpal tunnel syndrome, left upper limb: Secondary | ICD-10-CM | POA: Diagnosis not present

## 2022-01-13 ENCOUNTER — Emergency Department (HOSPITAL_BASED_OUTPATIENT_CLINIC_OR_DEPARTMENT_OTHER): Payer: Medicare HMO

## 2022-01-13 ENCOUNTER — Telehealth: Payer: Self-pay

## 2022-01-13 ENCOUNTER — Emergency Department (HOSPITAL_BASED_OUTPATIENT_CLINIC_OR_DEPARTMENT_OTHER)
Admission: EM | Admit: 2022-01-13 | Discharge: 2022-01-13 | Disposition: A | Discharge status: Home / Self Care | Payer: Medicare HMO | Attending: Emergency Medicine | Admitting: Emergency Medicine

## 2022-01-13 ENCOUNTER — Encounter (HOSPITAL_BASED_OUTPATIENT_CLINIC_OR_DEPARTMENT_OTHER): Payer: Self-pay | Admitting: Emergency Medicine

## 2022-01-13 ENCOUNTER — Other Ambulatory Visit: Payer: Self-pay

## 2022-01-13 DIAGNOSIS — I10 Essential (primary) hypertension: Secondary | ICD-10-CM | POA: Insufficient documentation

## 2022-01-13 DIAGNOSIS — Z7982 Long term (current) use of aspirin: Secondary | ICD-10-CM | POA: Diagnosis not present

## 2022-01-13 DIAGNOSIS — Z79899 Other long term (current) drug therapy: Secondary | ICD-10-CM | POA: Diagnosis not present

## 2022-01-13 DIAGNOSIS — M25532 Pain in left wrist: Secondary | ICD-10-CM | POA: Diagnosis not present

## 2022-01-13 DIAGNOSIS — Z043 Encounter for examination and observation following other accident: Secondary | ICD-10-CM | POA: Diagnosis not present

## 2022-01-13 DIAGNOSIS — M25522 Pain in left elbow: Secondary | ICD-10-CM | POA: Diagnosis not present

## 2022-01-13 DIAGNOSIS — Z7951 Long term (current) use of inhaled steroids: Secondary | ICD-10-CM | POA: Diagnosis not present

## 2022-01-13 DIAGNOSIS — S5002XA Contusion of left elbow, initial encounter: Secondary | ICD-10-CM | POA: Diagnosis not present

## 2022-01-13 DIAGNOSIS — Z96653 Presence of artificial knee joint, bilateral: Secondary | ICD-10-CM | POA: Diagnosis not present

## 2022-01-13 DIAGNOSIS — R0789 Other chest pain: Secondary | ICD-10-CM | POA: Diagnosis not present

## 2022-01-13 DIAGNOSIS — Z981 Arthrodesis status: Secondary | ICD-10-CM | POA: Diagnosis not present

## 2022-01-13 DIAGNOSIS — Z87891 Personal history of nicotine dependence: Secondary | ICD-10-CM | POA: Diagnosis not present

## 2022-01-13 DIAGNOSIS — Z85828 Personal history of other malignant neoplasm of skin: Secondary | ICD-10-CM | POA: Diagnosis not present

## 2022-01-13 DIAGNOSIS — E039 Hypothyroidism, unspecified: Secondary | ICD-10-CM | POA: Diagnosis not present

## 2022-01-13 DIAGNOSIS — J449 Chronic obstructive pulmonary disease, unspecified: Secondary | ICD-10-CM | POA: Diagnosis not present

## 2022-01-13 DIAGNOSIS — R930 Abnormal findings on diagnostic imaging of skull and head, not elsewhere classified: Secondary | ICD-10-CM | POA: Diagnosis not present

## 2022-01-13 DIAGNOSIS — M4322 Fusion of spine, cervical region: Secondary | ICD-10-CM | POA: Diagnosis not present

## 2022-01-13 DIAGNOSIS — W19XXXA Unspecified fall, initial encounter: Secondary | ICD-10-CM

## 2022-01-13 DIAGNOSIS — M4312 Spondylolisthesis, cervical region: Secondary | ICD-10-CM | POA: Diagnosis not present

## 2022-01-13 DIAGNOSIS — I2510 Atherosclerotic heart disease of native coronary artery without angina pectoris: Secondary | ICD-10-CM | POA: Diagnosis not present

## 2022-01-13 DIAGNOSIS — S59902A Unspecified injury of left elbow, initial encounter: Secondary | ICD-10-CM | POA: Diagnosis present

## 2022-01-13 DIAGNOSIS — S0990XA Unspecified injury of head, initial encounter: Secondary | ICD-10-CM | POA: Diagnosis not present

## 2022-01-13 DIAGNOSIS — W07XXXA Fall from chair, initial encounter: Secondary | ICD-10-CM | POA: Insufficient documentation

## 2022-01-13 DIAGNOSIS — S199XXA Unspecified injury of neck, initial encounter: Secondary | ICD-10-CM | POA: Diagnosis not present

## 2022-01-13 DIAGNOSIS — M7989 Other specified soft tissue disorders: Secondary | ICD-10-CM | POA: Diagnosis not present

## 2022-01-13 MED ORDER — TRAMADOL HCL 50 MG PO TABS: 50.0000 mg | ORAL_TABLET | Freq: Two times a day (BID) | ORAL | 0 refills | Status: DC | PRN

## 2022-01-13 NOTE — Telephone Encounter (Signed)
Nurse Assessment ?Nurse: Sheppard Plumber, RN, Estill Bamberg Date/Time (Eastern Time): 01/13/2022 11:39:01 AM ?Confirm and document reason for call. If ?symptomatic, describe symptoms. ?---caller states he had carpel tunnel surgery wed. last ?night took pain med, went to use the bathroom and he ?got weak, hit the wall with his head, went down to the ?floor. his wife is there. ?Does the patient have any new or worsening ?symptoms? ---Yes ?Will a triage be completed? ---Yes ?Related visit to physician within the last 2 weeks? ---Yes ?Does the PT have any chronic conditions? (i.e. ?diabetes, asthma, this includes High risk factors for ?pregnancy, etc.) ?---Yes ?List chronic conditions. ---heart murmur, neck surgery ?Is this a behavioral health or substance abuse call? ---No ?Guidelines ?Guideline Title Affirmed Question Affirmed Notes Nurse Date/Time (Eastern ?Time) ?Head Injury [1] ACUTE NEURO ?SYMPTOM AND ?[2] present now ?(DEFINITION: ?difficult to awaken ?OR confused thinking ?and talking OR ?slurred speech OR ?[place], RN, ?Estill Bamberg ?01/13/2022 11:42:17 ?AM ?PLEASE NOTE: All timestamps contained within this report are represented as [place] Standard Time. ?CONFIDENTIALTY NOTICE: This fax transmission is intended only for the addressee. It contains information that is legally privileged, confidential or ?otherwise protected from use or disclosure. If you are not the intended recipient, you are strictly prohibited from reviewing, disclosing, copying using ?or disseminating any of this information or taking any action in reliance on or regarding this information. If you have received this fax in error, please ?notify us immediately by telephone so that we can arrange for its return to [place]. Phone: [phone], Toll-Free: [phone], Fax: [phone] ?Page: 2 of 2 ?Call Id: [id number] ?Guidelines ?Guideline Title Affirmed Question Affirmed Notes Nurse Date/Time (Eastern ?Time) ?weakness of arms OR ?unsteady walking) ?Disp. Time  (Eastern ?Time) Disposition Final User ?01/13/2022 11:33:46 AM Send to Urgent Johny Sax ?01/13/2022 11:46:27 AM 911 Outcome Documentation [place], RN, Estill Bamberg ?Reason: wife driving him ?01/13/2022 11:45:04 AM Call EMS 911 Now Yes [place], RN, Estill Bamberg ?Caller Disagree/Comply Comply ?Caller Understands Yes ?PreDisposition InappropriateToAsk ?Care Advice Given Per Guideline ?CARE ADVICE given per Head Injury (Adult) guideline. CALL EMS 911 NOW: * Immediate medical attention is needed. You ?need to hang up and call 911 (or an ambulance). ?Comments ?User: Rozelle Logan, RN Date/Time Eilene Ghazi Time): 01/13/2022 11:41:25 AM ?was treated for UTI recently. ?Referrals ?[facility] - ED ?

## 2022-01-13 NOTE — Discharge Instructions (Addendum)
Please stop taking they hydrocodone that was prescribed by surgeon, take tramadol instead.  ? ?Please continue to wear sling ? ?It was a pleasure caring for you today in the emergency department. ? ? ? ? ?Based on the events which brought you to the ER today, it is possible that you may have a concussion. A concussion occurs when there is a blow to the head or body, with enough force to shake the brain and disrupt how the brain functions. You may experience symptoms such as headaches, sensitivity to light/noise, dizziness, cognitive slowing, difficulty concentrating / remembering, trouble sleeping and drowsiness. These symptoms may last anywhere from hours/days to potentially weeks/months. While these symptoms are very frustrating and perhaps debilitating, it is important that you remember that they will improve over time. Everyone has a different rate of recovery; it is difficult to predict when your symptoms will resolve. In order to allow for your brain to heal after the injury, we recommend that you see your primary physician or a physician knowledgeable in concussion management. We also advise you to let your body and brain rest: avoid physical activities (sports, gym, and exercise) and reduce cognitive demands (reading, texting, TV watching, computer use, video games, etc). School attendance, after-school activities and work may need to be modified to avoid increasing symptoms. We recommend against driving until until all symptoms have resolved.  Come back to the ER right away if you are having repeated episodes of vomiting, severe/worsening headache/dizziness or any other symptom that alarms you. We recommended that someone stay with you for the next 24 hours to monitor for these worrisome symptoms. ?Please return to the emergency department for any worsening or worrisome symptoms. ? ?

## 2022-01-13 NOTE — ED Notes (Signed)
Patient transported to CT 

## 2022-01-13 NOTE — ED Notes (Signed)
ED Provider at bedside. 

## 2022-01-13 NOTE — ED Triage Notes (Signed)
Pt reports fell last night , stood up from chair , felt weak , fell on left side , head injury . Sts taking pain killers for his recent carpel tunnel surgery to left hand . ?Denies loc , sts feels lethargic and not sure if it is from pain killers .  ?

## 2022-01-13 NOTE — ED Provider Notes (Addendum)
?[place] EMERGENCY DEPARTMENT ?Provider Note ? ? ?CSN: [id number] ?Arrival date & time: 01/13/22  1202 ? ?  ? ?History ? ?Chief Complaint  ?Patient presents with  ? Fall  ? ? ?[name] Pratt is a 81 y.o. male. ? ? Patient as above with significant medical history as below, including hypertension, COPD, GERD, HLD recent carpal tunnel release [name] who presents to the ED with complaint of fall.  Patient recently had carpal tunnel release surgery, he took one of his prescription strength pain medications last night, he stepped and felt weak, fell down.  Hit his head on the wall.  No LOC.  No thinners.  Also hit his left elbow on the ground.  He was assisted up by his wife.  He uses a cane at baseline.  Limited use of his left wrist secondary to recent surgery.  This incident was yesterday evening. ? ?During assessment today he has no headache, no focal weakness or numbness.  No nausea or vomiting.  Other than wrist pain and elbow pain he feels roughly at his baseline. ? ? ? ? ?Past Medical History: ?02/02/2016: Allergic rhinitis ?12/17/2009: Anemia ?    Comment:  Formatting of this note might be different from the  ?             original. Anemia  10/1 IMO update ?03/05/2020: Aortic atherosclerosis ([place]) ?No date: Arthritis ?02/29/2016: Asymmetric SNHL (sensorineural hearing loss) ?11/24/2015: Atypical chest pain ?05/13/2009: Benign essential hypertension ?    Comment:  Qualifier: Diagnosis of  By: Burnett Kanaris     ?             Formatting of this note might be different from the  ?             original. Hypertension ?07/22/2021: 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[place]) ?04/11/2021: C. difficile colitis ?No date: Cancer [facility]) ?    Comment:  skin cancer ?No date: Carotid stenosis ?    Comment:  a. Carotid U/S 3/23: LICA < 55%, RICA 73-22%;  b.   ?             Carotid U/S 0/25:  RICA 42-70%; LICA 6-23%; f/u 1 year ?11/24/2020: Cervical myelopathy ([place]) ?03/25/2018: Complex sleep apnea syndrome ?No date:  Complication of anesthesia ?    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    Comment:  Qualifier: Diagnosis of  By: Burnett Kanaris   ?07/22/2021: Greater trochanteric pain syndrome ?02/02/2016: Hearing loss ?02/29/2020: Heme positive stool ?05/13/2009: HIATAL HERNIA ?    Comment:  Qualifier: Diagnosis of  By: Burnett Kanaris   ?01/18/2012: High risk medication use ?03/02/2021: Hip pain ?No date: History of chicken pox ?No date: History of hemorrhoids ?No date: History of kidney stones ?07/08/2020: History of total bilateral knee replacement ?03/03/2014: Hyperglycemia ?No date: Hyperlipidemia ?    Comment:  under control ?12/27/2010: Hyperlipidemia with target LDL less than 130 ?    Comment:  Formatting of this note might be different from the  ?  original. Cardiology - Dr Frances Nickels at [facility] cardiology   ?             ICD-10 cut over ?No date: Hypertension ?    Comment:  under control ?07/08/2021: Hypokalemia ?No date: Hypothyroidism ?03/03/2020: Internal hemorrhoids ?11/13/2019: Leg cramps ?12/12/2012: Lower GI bleed ?10/21/2020: Lumbago of lumbar region with sciatica ?03/02/2021: Lumbar radiculopathy ?11/02/2020: Lumbar spondylosis ?04/11/2021: Nasal congestion ?05/13/2009: NEPHROLITHIASIS ?    Comment:  Qualifier: Diagnosis of  By:  Burnett Kanaris   ?01/06/2013: Nonspecific abnormal electrocardiogram (ECG) (EKG) ?06/30/2015: Onychomycosis ?06/13/2010: Osteoarthrosis, hand ?    Comment:  Formatting of this note might be different from the  ?             original. Osteoarthritis Of The Hand  10/1 IMO update ?07/25/2010: Osteoarthrosis, unspecified whether generalized or  ?localized, pelvic region and thigh ?    Comment:  Formatting of this note might be different from the  ?             original. Osteoarthritis Of The Hip ?07/22/2021: Other abnormal glucose ?02/18/2015: Other allergic rhinitis ?09/12/1999: Other ill-defined and unknown causes of morbidity and  ?mortality ?    Comment:  Nov 20, 2000 Entered By: Alycia Patten Comment: ltFeb  ?             23, 2006 Entered ByAlycia Patten Comment: rt 11/05 ?06/19/2016: Peripheral neuropathy ?No date: PONV (postoperative nausea and vomiting) ?11/24/2015: Preventative health care ?11/29/2012: Right groin pain ?05/13/2009: Right inguinal hernia ?    Comment:  Qualifier: Diagnosis of  By: Burnett Kanaris   ?02/18/2015: RLS (restless legs syndrome) ?No date: Rosacea ?07/27/2013: Rotator cuff tear ?03/02/2021: Situational depression ?07/15/2013: Sleep apnea with use of continuous positive airway  ?pressure (CPAP) ?    Comment:  CPAP set to  7 cm water,  Residual AHi was 7.8  With no  ?             leak.  User time 6 hours.  479 days , not used only 12  ?             days - highly compliant 06-23-13  .  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    Comment:  Followed by [name] ?11/26/2020: Urinary retention ?07/08/2012: Ventral hernia ?07/08/2021: Vertigo ?03/03/2020: Weight loss ? ?Past Surgical History: ?07/15/12:  ABDOMINAL HERNIA REPAIR ?    Comment:  Dr [name] ?07/09/2020: ANTERIOR CERVICAL DECOMP/DISCECTOMY FUSION; N/A ?    Comment:  Procedure: ANTERIOR CERVICAL DECOMPRESSION AND FUSION  ?             CERVICAL THREE-FOUR.;  Surgeon: [name], MD;   ?             Location: [place];  Service: Neurosurgery;  Laterality:  ?             N/A;  anterior ?05/07/01: BROW LIFT ?01/28/2014: CYSTOSCOPY WITH URETEROSCOPY AND STENT PLACEMENT; Right ?    Comment:  Procedure: CYSTOSCOPY WITH URETEROSCOPY, BASKET RETRIVAL ?             AND  STENT PLACEMENT;  Surgeon: [name], MD;   ?             [facility];  Service: Urology;  Laterality: Right; ?No date:  epidural injections ?    Comment:  multiple procedures ?1993 and 1994: ESOPHAGEAL DILATION ?    Comment:  multiple times ?03/25/10, 2012: EYE SURGERY; Bilateral ?    Comment:  cataract removal ?2014: [place] ?12/21/93: HIATAL HERNIA REPAIR ?01/28/2014: HOLMIUM LASER APPLICATION; Right ?    Comment:  Procedure: HOLMIUM LASER APPLICATION;  Surgeon: Camelia Eng  ?             Risa Grill, MD;  [facility];  Service: Urology;   ?             Laterality: Right; ?07/15/12: INGUINAL HERNIA REPAIR; Right ?    Comment:  Dr [name], x2 ?07/25/04: JOINT REPLACEMENT ?    Comment:  right knee ?07/13/10: JOINT REPLACEMENT ?    Comment:  left knee ?1995: KNEE SURGERY ?09/20/06: LAPAROSCOPIC CHOLECYSTECTOMY ?    Comment:  with intraoperative cholangiogram and right inguinal  ?             herniorrhaphy with mesh  ?07/2013: LIGAMENT REPAIR; Left ?    Comment:  shoulder ?No date: LITHOTRIPSY ?    Comment:  x2 ?1975: NASAL SEPTUM SURGERY ?11/24/2020: POSTERIOR CERVICAL FUSION/FORAMINOTOMY; N/A ?    Comment:  Procedure: Posterior Cervical Laminectomy Cervical  ?             three-four, Cervical four-five with lateral mass fusion;  ?             Surgeon: [name], MD;  Location: [place];  Service:  ?             Neurosurgery;  Laterality: N/A; ?No date: REFRACTIVE SURGERY; Bilateral ?4/14: Right  total hip replacement ?No date: SKIN CANCER DESTRUCTION ?    Comment:  nose, ear ?as child: TONSILLECTOMY ?No date: TOTAL HIP ARTHROPLASTY; Left ?1991: URETHRAL DILATION ?    Comment:  [name]  ? ? ?The history is

## 2022-01-16 ENCOUNTER — Ambulatory Visit: Payer: Medicare HMO | Admitting: Adult Health

## 2022-01-16 ENCOUNTER — Encounter: Payer: Self-pay | Admitting: Family

## 2022-01-16 ENCOUNTER — Ambulatory Visit (INDEPENDENT_AMBULATORY_CARE_PROVIDER_SITE_OTHER): Payer: Medicare HMO | Admitting: Family

## 2022-01-16 VITALS — BP 116/56 | HR 66 | Temp 98.1°F | Resp 16 | Wt 149.0 lb

## 2022-01-16 DIAGNOSIS — M79642 Pain in left hand: Secondary | ICD-10-CM

## 2022-01-16 DIAGNOSIS — Z85828 Personal history of other malignant neoplasm of skin: Secondary | ICD-10-CM | POA: Diagnosis not present

## 2022-01-16 DIAGNOSIS — L989 Disorder of the skin and subcutaneous tissue, unspecified: Secondary | ICD-10-CM

## 2022-01-16 HISTORY — DX: Disorder of the skin and subcutaneous tissue, unspecified: L98.9

## 2022-01-16 HISTORY — DX: Pain in left hand: M79.642

## 2022-01-16 MED ORDER — TRAMADOL HCL 50 MG PO TABS: 50.0000 mg | ORAL_TABLET | Freq: Two times a day (BID) | ORAL | 0 refills | Status: DC | PRN

## 2022-01-16 NOTE — Patient Instructions (Addendum)
You should be contacted about scheduling your appointment with Dermatology and sports medicine.  ? ?

## 2022-01-16 NOTE — Assessment & Plan Note (Signed)
New. Suspect in part due to his carpal tunnel release, but x-ray also noted question of triquetral fracture. Will refer to sports medicine.  Also, he found hydrocodone too sedating and is requesting refill on tramadol which he has used before. Notes he has been sleeping a lot. Recommended that he try to use tylenol for pain. If pain is severe he can use tramadol prn.  ?

## 2022-01-16 NOTE — Progress Notes (Signed)
? ?Subjective:  ? ? ? Patient ID: [name] Pratt, male    DOB: 1941-04-08, 81 y.o.   MRN: [id number] ? ?Chief Complaint  ?Patient presents with  ? Follow-up  ?  Here for er follow up after falling 01-12-2022  ? ? ?HPI ?Patient is in today for ED follow up. ED record is reviewed. Pt presented on 01/13/22. This occurred after he took one of his pain medications for his recent carpal tunnel syndrome. States his head hit the wall and he fell onto his left side.  He had the following tests performed in the ED: ? ?CXR- WNL ?L Shoulder- no osseous abnormality ?L Elbow- OA, no fracture ?L wrist- mild dorsal wrist soft tissue swelling, ? Triquetral fracture ?CT C-spine- no fracture ?CT head- no intracranial abnormality.  ? ?Health Maintenance Due  ?Topic Date Due  ? TETANUS/TDAP  12/26/2020  ? FOOT EXAM  09/29/2021  ? ? ?Past Medical History:  ?Diagnosis Date  ? Allergic rhinitis 02/02/2016  ? Anemia 12/17/2009  ? Formatting of this note might be different from the original. Anemia  10/1 IMO update  ? Aortic atherosclerosis ([place]) 03/05/2020  ? Arthritis   ? 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 Atypical chest pain 11/24/2015  ? Benign essential hypertension 05/13/2009  ? Qualifier: Diagnosis of  By: Larwance Sachs of this note might be different from the original. Hypertension  ? 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[place]) 07/22/2021  ? C. difficile colitis 04/11/2021  ? C. difficile colitis 04/11/2021  ? Cancer [facility])   ? skin cancer  ? Carotid stenosis   ? a. Carotid U/S 4/88: LICA < 89%, RICA 16-94%;  b.  Carotid U/S 5/03:  RICA 88-82%; LICA 8-00%; f/u 1 year  ? Cervical myelopathy ([place]) 11/24/2020  ? 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 Complication of anesthesia   ? "stomach does not wake up"  ? COPD (chronic obstructive pulmonary disease) ([place])   ? CPAP (continuous positive airway pressure) dependence 03/25/2018  ? 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 Cystoid macular edema of both eyes  07/28/2020  ? Degenerative disc disease, lumbar 06/30/2015  ? Deviated nasal septum 02/18/2015  ? Diverticulosis 03/03/2020  ? DJD (degenerative joint disease) of hip 12/24/2012  ? Dyspnea 06/12/2011  ? Emphysema, unspecified ([place]) 03/05/2020  ? Erectile dysfunction 06/20/2017  ? 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[place]) 02/09/2021  ? Gait disorder 03/02/2021  ? GERD (gastroesophageal reflux disease)   ? 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 Qualifier: Diagnosis of  By: Burnett Kanaris    ? Greater trochanteric pain syndrome 07/22/2021  ? Hearing loss 02/02/2016  ? Heme positive stool 02/29/2020  ? HIATAL HERNIA 05/13/2009  ? Qualifier: Diagnosis of  By: Burnett Kanaris    ? High risk medication use 01/18/2012  ? Hip pain 03/02/2021  ? History of chicken pox   ? History of hemorrhoids   ? History of kidney stones   ? History of total bilateral knee replacement 07/08/2020  ? Hyperglycemia 03/03/2014  ? Hyperlipidemia   ? under control  ? Hyperlipidemia with target LDL less than 130 12/27/2010  ? Formatting of this note might be different from the original. Cardiology - Dr Frances Nickels at [facility] cardiology  ICD-10 cut over  ? Hypertension   ? under control  ? Hypokalemia 07/08/2021  ? Hypothyroidism   ? Internal hemorrhoids 03/03/2020  ? Leg cramps 11/13/2019  ? Lower GI bleed 12/12/2012  ? Lumbago  of lumbar region with sciatica 10/21/2020  ? Lumbar radiculopathy 03/02/2021  ? Lumbar spondylosis 11/02/2020  ? Nasal congestion 04/11/2021  ? NEPHROLITHIASIS 05/13/2009  ? Qualifier: Diagnosis of  By: Burnett Kanaris    ? Nonspecific abnormal electrocardiogram (ECG) (EKG) 01/06/2013  ? Onychomycosis 06/30/2015  ? 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 Formatting of this note might be different from the original. Osteoarthritis Of The Hand  10/1 IMO update  ? Osteoarthrosis, unspecified whether generalized or localized, pelvic region and thigh 07/25/2010  ? Formatting of this note  might be different from the original. Osteoarthritis Of The Hip  ? Other abnormal glucose 07/22/2021  ? Other allergic rhinitis 02/18/2015  ? Other ill-defined and unknown causes of morbidity and mortality 09/12/1999  ? Nov 20, 2000 Entered By: Alycia Patten Comment: ltFeb 23, 2006 Entered By: Alycia Patten Comment: rt 11/05  ? Peripheral neuropathy 06/19/2016  ? PONV (postoperative nausea and vomiting)   ? PONV (postoperative nausea and vomiting) 07/22/2021  ? Preventative health care 11/24/2015  ? Right groin pain 11/29/2012  ? Right inguinal hernia 05/13/2009  ? Qualifier: Diagnosis of  By: Burnett Kanaris    ? RLS (restless legs syndrome) 02/18/2015  ? Rosacea   ? Rotator cuff tear 07/27/2013  ? Situational depression 03/02/2021  ? Sleep apnea with use of continuous positive airway pressure (CPAP) 07/15/2013  ? CPAP set to  7 cm water,  Residual AHi was 7.8  With no leak.  User time 6 hours.  479 days , not used only 12 days - highly compliant 06-23-13  .   ? Spondylitis ([place]) 07/22/2021  ? Status post cervical spinal fusion 07/09/2020  ? 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 Tibialis anterior tendon tear, nontraumatic 11/13/2019  ? Trochanteric bursitis of left hip 06/30/2021  ? 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 Followed by [name]  ? Urinary retention 11/26/2020  ? Ventral hernia 07/08/2012  ? Vertigo 07/08/2021  ? Weight loss 03/03/2020  ? ? ?Past Surgical History:  ?Procedure Laterality Date  ? ABDOMINAL HERNIA REPAIR  07/15/12  ? Dr [name]  ? ANTERIOR CERVICAL DECOMP/DISCECTOMY FUSION N/A 07/09/2020  ? Procedure: ANTERIOR CERVICAL DECOMPRESSION AND FUSION CERVICAL THREE-FOUR.;  Surgeon: [name], MD;  Location: [facility];  Service: Neurosurgery;  Laterality: N/A;  anterior  ? BROW LIFT  05/07/01  ? CYSTOSCOPY WITH URETEROSCOPY AND STENT PLACEMENT Right 01/28/2014  ? Procedure: CYSTOSCOPY WITH URETEROSCOPY, BASKET RETRIVAL AND  STENT PLACEMENT;   Surgeon: [name], MD;  Location: [facility];  Service: Urology;  Laterality: Right;  ? epidural injections    ? multiple procedures  ? [place]  ? multiple times  ? EYE SURGERY Bilateral 03/25/10, 2012  ? cataract removal  ? HEMORRHOID SURGERY  2014  ? HIATAL HERNIA REPAIR  12/21/93  ? HOLMIUM LASER APPLICATION Right 8/54/6270  ? Procedure: HOLMIUM LASER APPLICATION;  Surgeon: [name], MD;  Location: [facility];  Service: Urology;  Laterality: Right;  ? INGUINAL HERNIA REPAIR Right 07/15/12  ? Dr [name], x2  ? JOINT REPLACEMENT  07/25/04  ? right knee  ? JOINT REPLACEMENT  07/13/10  ? left knee  ? KNEE SURGERY  1995  ? LAPAROSCOPIC CHOLECYSTECTOMY  09/20/06  ? with intraoperative cholangiogram and right inguinal herniorrhaphy with mesh   ? LIGAMENT REPAIR Left 07/2013  ? shoulder  ? LITHOTRIPSY    ? x2  ? NASAL SEPTUM SURGERY  1975  ? POSTERIOR CERVICAL FUSION/FORAMINOTOMY N/A 11/24/2020  ? Procedure: Posterior Cervical Laminectomy Cervical three-four, Cervical four-five with lateral mass fusion;  Surgeon: [name], MD;  Location: [facility];  Service: Neurosurgery;  Laterality: N/A;  ? REFRACTIVE SURGERY Bilateral   ? Right total hip replacement  4/14  ? SKIN CANCER DESTRUCTION    ? nose, ear  ? TONSILLECTOMY  as child  ? TOTAL HIP ARTHROPLASTY Left   ? URETHRAL DILATION  1991  ? [name]  ? ? ?Family History  ?Problem Relation Age of Onset  ? Cancer Mother   ? Hypertension Other   ? Cancer Other   ? Osteoarthritis Other   ? ? ?Social History  ? ?Socioeconomic History  ? Marital status: Married  ?  Spouse name: Not on file  ? Number of children: 1  ? Years of education: Not on file  ? Highest education level: Not on file  ?Occupational History  ? Occupation: firefighter  ?  Comment: paints on the side  ?Tobacco Use  ? Smoking status: Former  ?  Years: 11.00  ?  Types: Cigarettes  ?  Quit date: 09/11/1978  ?  Years since quitting: 43.3  ? Smokeless tobacco: Never  ?Vaping Use  ? Vaping Use:  Never used  ?Substance and Sexual Activity  ? Alcohol use: Not Currently  ?  Alcohol/week: 0.0 standard drinks  ?  Comment: rare  ? Drug use: No  ? Sexual activity: Not Currently  ?Other Topics Concern  ? Not on

## 2022-01-16 NOTE — Assessment & Plan Note (Signed)
Had recent Moh's surgery to forehead ?

## 2022-01-16 NOTE — Assessment & Plan Note (Signed)
Concerning for skin cancer. Will refer him back to his dermatologist. ?

## 2022-01-17 ENCOUNTER — Telehealth: Payer: Self-pay | Admitting: Family

## 2022-01-17 MED ORDER — HYDRALAZINE HCL 50 MG PO TABS: 50.0000 mg | ORAL_TABLET | Freq: Two times a day (BID) | ORAL | 2 refills | Status: DC

## 2022-01-17 NOTE — Telephone Encounter (Signed)
Medication: hydrALAZINE (APRESOLINE) 50 MG tablet ? ?Has the patient contacted their pharmacy? Yes.   ? ?Preferred Pharmacy (with phone number or street name):  ?[facility]  ?[place], [place] [address]  ?Phone:  [phone]  Fax:  [phone]  ? ?Agent: Please be advised that RX refills may take up to 3 business days. We ask that you follow-up with your pharmacy.  ?

## 2022-01-17 NOTE — Telephone Encounter (Signed)
Rx sent 

## 2022-01-18 NOTE — Progress Notes (Signed)
? ?Subjective:  ? [name] Pratt is a 81 y.o. male who presents for Medicare Annual/Subsequent preventive examination. ? ?I connected with  [name] Pratt on 01/19/22 by a audio enabled telemedicine application and verified that I am speaking with the correct person using two identifiers. ? ?Patient Location: Home ? ?Provider Location: Office/Clinic ? ?I discussed the limitations of evaluation and management by telemedicine. The patient expressed understanding and agreed to proceed.  ? ?Review of Systems    ? ?Cardiac Risk Factors include: advanced age (>35mn, >>45women);hypertension;dyslipidemia ? ?   ?Objective:  ?  ?There were no vitals filed for this visit. ?There is no height or weight on file to calculate BMI. ? ? ?  01/19/2022  ?  3:46 PM 01/13/2022  ? 12:14 PM 10/05/2021  ?  9:43 AM 08/27/2021  ? 12:53 PM 06/24/2021  ?  8:40 AM 06/14/2021  ?  3:01 PM 12/06/2020  ?  9:13 AM  ?Advanced Directives  ?Does Patient Have a Medical Advance Directive? Yes Yes Yes Yes Yes Yes Yes  ?Type of AParamed[place]of [place]ew[place] of facility DNR (pink MOST or yellow form);Living will Living will;Healthcare Power of [place]St. [place]Living will [place] HeightsLiving will Healthcare Power of [place]inhagakLiving will [place]Living will  ?Does patient want to make changes to medical advance directive? No - Patient declined  No - Patient declined No - Patient declined No - Patient declined  No - Patient declined  ?Copy of HRobinhoodin Chart? Yes - validated most recent copy scanned in chart (See row information)  Yes - validated most recent copy scanned in chart (See row information) Yes - validated most recent copy scanned in chart (See row information) No - copy requested No - copy requested Yes - validated most recent copy scanned in chart (See row information)  ? ? ?Current Medications  (verified) ?Outpatient Encounter Medications as of 01/19/2022  ?Medication Sig  ? acetaminophen (TYLENOL) 650 MG CR tablet Take 1,300 mg by mouth every morning. And take 650 mg at bedtime  ? albuterol (VENTOLIN HFA) 108 (90 Base) MCG/ACT inhaler INHALE 2 PUFFS INTO THE LUNGS EVERY 6 HOURS AS NEEDED FOR SHORTNESS OF BREATH.  ? Alcohol Swabs (ALCOHOL PADS) 70 % PADS 1 packet by Does not apply route 2 (two) times daily.  ? allopurinol (ZYLOPRIM) 100 MG tablet Take 100 mg by mouth 2 (two) times daily.  ? amLODipine (NORVASC) 10 MG tablet TAKE 1 TABLET EVERY DAY  ? ampicillin (PRINCIPEN) 500 MG capsule Take 1 capsule (500 mg total) by mouth 4 (four) times daily.  ? aspirin EC 81 MG tablet Take 81 mg by mouth every morning.  ? Cholecalciferol (VITAMIN D) 50 MCG (2000 UT) CAPS Take 2,000 Units by mouth daily.  ? colchicine 0.6 MG tablet Take 0.6 mg by mouth as needed for other. For gout  ? cycloSPORINE (RESTASIS) 0.05 % ophthalmic emulsion Place 1 drop into both eyes 2 (two) times daily.  ? diclofenac Sodium (VOLTAREN) 1 % GEL Apply 1 application topically daily as needed for pain (Hand pain).  ? fluocinonide (LIDEX) 0.05 % external solution Apply 1 application topically 2 (two) times daily as needed for itching or pain.  ? gabapentin (NEURONTIN) 300 MG capsule TAKE 1 CAPSULE TWO TO THREE TIMES DAILY AS NEEDED  ? GEMTESA 75 MG TABS Take 1 tablet by mouth daily.  ? glucose blood test strip TRUE Metrix  Blood Glucose Test Strip, use as instructed  ? Homeopathic Products (THERAWORX RELIEF) FOAM Apply 1 application topically 2 (two) times daily as needed (Pain).  ? hydrALAZINE (APRESOLINE) 50 MG tablet Take 1 tablet (50 mg total) by mouth 2 (two) times daily.  ? Lancets MISC 1 each by Does not apply route 2 (two) times daily. TRUE matrix lancets  ? levothyroxine (SYNTHROID) 150 MCG tablet Take 150 mcg 6 days a week (patient takes 75 mcg once a week) ?Disp #84 for 90 day supply  ? levothyroxine (SYNTHROID) 75 MCG tablet Take 75  mcg once a week (patient on 150 mcg 6 days a week) ?Dispense #24 for 90 day supply.  ? losartan (COZAAR) 100 MG tablet TAKE 1 TABLET EVERY DAY  ? Magnesium 500 MG TABS Take by mouth. OTC  ? meclizine (ANTIVERT) 25 MG tablet Take 1 tablet (25 mg total) by mouth 3 (three) times daily as needed for dizziness.  ? multivitamin (THERAGRAN) per tablet Take 1 tablet by mouth daily.  ? nitroGLYCERIN (NITROSTAT) 0.4 MG SL tablet Place 1 tablet (0.4 mg total) under the tongue every 5 (five) minutes as needed for chest pain.  ? ondansetron (ZOFRAN-ODT) 4 MG disintegrating tablet Take 1 tablet (4 mg total) by mouth every 8 (eight) hours as needed for nausea or vomiting.  ? polyethylene glycol (MIRALAX / GLYCOLAX) 17 g packet Take 17-34 g by mouth daily as needed.  ? rosuvastatin (CRESTOR) 5 MG tablet TAKE 1 TABLET EVERY DAY  ? sodium chloride (OCEAN) 0.65 % nasal spray Place 1 spray into both nostrils at bedtime as needed for congestion.  ? traMADol (ULTRAM) 50 MG tablet Take 1 tablet (50 mg total) by mouth every 12 (twelve) hours as needed.  ? ?No facility-administered encounter medications on file as of 01/19/2022.  ? ? ?Allergies (verified) ?Pravastatin, Sildenafil, Oxycodone, Atenolol, Elastic bandages & [zinc], and Metoclopramide hcl  ? ?History: ?Past Medical History:  ?Diagnosis Date  ? Allergic rhinitis 02/02/2016  ? Anemia 12/17/2009  ? Formatting of this note might be different from the original. Anemia  10/1 IMO update  ? Aortic atherosclerosis ([place]) 03/05/2020  ? Arthritis   ? 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 Atypical chest pain 11/24/2015  ? Benign essential hypertension 05/13/2009  ? Qualifier: Diagnosis of  By: Larwance Sachs of this note might be different from the original. Hypertension  ? 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[place]) 07/22/2021  ? C. difficile colitis 04/11/2021  ? C. difficile colitis 04/11/2021  ? Cancer [facility])   ? skin cancer  ? Carotid stenosis   ? a. Carotid U/S 1/69:  LICA < 45%, RICA 03-88%;  b.  Carotid U/S 8/28:  RICA 00-34%; LICA 9-17%; f/u 1 year  ? Cervical myelopathy ([place]) 11/24/2020  ? 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 Complication of anesthesia   ? "stomach does not wake up"  ? COPD (chronic obstructive pulmonary disease) ([place])   ? CPAP (continuous positive airway pressure) dependence 03/25/2018  ? 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 Cystoid macular edema of both eyes 07/28/2020  ? Degenerative disc disease, lumbar 06/30/2015  ? Deviated nasal septum 02/18/2015  ? Diverticulosis 03/03/2020  ? DJD (degenerative joint disease) of hip 12/24/2012  ? Dyspnea 06/12/2011  ? Emphysema, unspecified ([place]) 03/05/2020  ? Erectile dysfunction 06/20/2017  ? 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[place]) 02/09/2021  ? Gait disorder 03/02/2021  ? GERD (gastroesophageal reflux disease)   ?  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 Qualifier: Diagnosis of  By: Burnett Kanaris    ? Greater trochanteric pain syndrome 07/22/2021  ? Hearing loss 02/02/2016  ? Heme positive stool 02/29/2020  ? HIATAL HERNIA 05/13/2009  ? Qualifier: Diagnosis of  By: Burnett Kanaris    ? High risk medication use 01/18/2012  ? Hip pain 03/02/2021  ? History of chicken pox   ? History of hemorrhoids   ? History of kidney stones   ? History of total bilateral knee replacement 07/08/2020  ? Hyperglycemia 03/03/2014  ? Hyperlipidemia   ? under control  ? Hyperlipidemia with target LDL less than 130 12/27/2010  ? Formatting of this note might be different from the original. Cardiology - Dr Frances Nickels at [facility] cardiology  ICD-10 cut over  ? Hypertension   ? under control  ? Hypokalemia 07/08/2021  ? Hypothyroidism   ? Internal hemorrhoids 03/03/2020  ? Leg cramps 11/13/2019  ? Lower GI bleed 12/12/2012  ? Lumbago of lumbar region with sciatica 10/21/2020  ? Lumbar radiculopathy 03/02/2021  ? Lumbar spondylosis 11/02/2020  ? Nasal congestion 04/11/2021  ? NEPHROLITHIASIS  05/13/2009  ? Qualifier: Diagnosis of  By: Burnett Kanaris    ? Nonspecific abnormal electrocardiogram (ECG) (EKG) 01/06/2013  ? Onychomycosis 06/30/2015  ? 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 Formatting of this note mig

## 2022-01-19 ENCOUNTER — Ambulatory Visit (INDEPENDENT_AMBULATORY_CARE_PROVIDER_SITE_OTHER): Payer: Medicare HMO

## 2022-01-19 DIAGNOSIS — Z0000 Encounter for general adult medical examination without abnormal findings: Secondary | ICD-10-CM

## 2022-01-19 NOTE — Patient Instructions (Signed)
[name] Pratt , ?Thank you for taking time to come for your Medicare Wellness Visit. I appreciate your ongoing commitment to your health goals. Please review the following plan we discussed and let me know if I can assist you in the future.  ? ?Screening recommendations/referrals: ?Colonoscopy: no longer needed ?Recommended yearly ophthalmology/optometry visit for glaucoma screening and checkup ?Recommended yearly dental visit for hygiene and checkup ? ?Vaccinations: ?Influenza vaccine: up to date ?Pneumococcal vaccine: up to date ?Tdap vaccine: up to date ?Shingles vaccine: up to date   ?Covid-19: completed ? ?Advanced directives: yes, on file ? ?Conditions/risks identified: see problem list  ? ?Next appointment: Follow up in one year for your annual wellness visit.  ? ?Preventive Care 35 Years and Older, Male ?Preventive care refers to lifestyle choices and visits with your health care provider that can promote health and wellness. ?What does preventive care include? ?A yearly physical exam. This is also called an annual well check. ?Dental exams once or twice a year. ?Routine eye exams. Ask your health care provider how often you should have your eyes checked. ?Personal lifestyle choices, including: ?Daily care of your teeth and gums. ?Regular physical activity. ?Eating a healthy diet. ?Avoiding tobacco and drug use. ?Limiting alcohol use. ?Practicing safe sex. ?Taking low doses of aspirin every day. ?Taking vitamin and mineral supplements as recommended by your health care provider. ?What happens during an annual well check? ?The services and screenings done by your health care provider during your annual well check will depend on your age, overall health, lifestyle risk factors, and family history of disease. ?Counseling  ?Your health care provider may ask you questions about your: ?Alcohol use. ?Tobacco use. ?Drug use. ?Emotional well-being. ?Home and relationship well-being. ?Sexual activity. ?Eating  habits. ?History of falls. ?Memory and ability to understand (cognition). ?Work and work Statistician. ?Screening  ?You may have the following tests or measurements: ?Height, weight, and BMI. ?Blood pressure. ?Lipid and cholesterol levels. These may be checked every 5 years, or more frequently if you are over 24 years old. ?Skin check. ?Lung cancer screening. You may have this screening every year starting at age 38 if you have a 30-pack-year history of smoking and currently smoke or have quit within the past 15 years. ?Fecal occult blood test (FOBT) of the stool. You may have this test every year starting at age 38. ?Flexible sigmoidoscopy or colonoscopy. You may have a sigmoidoscopy every 5 years or a colonoscopy every 10 years starting at age 46. ?Prostate cancer screening. Recommendations will vary depending on your family history and other risks. ?Hepatitis C blood test. ?Hepatitis B blood test. ?Sexually transmitted disease (STD) testing. ?Diabetes screening. This is done by checking your blood sugar (glucose) after you have not eaten for a while (fasting). You may have this done every 1-3 years. ?Abdominal aortic aneurysm (AAA) screening. You may need this if you are a current or former smoker. ?Osteoporosis. You may be screened starting at age 56 if you are at high risk. ?Talk with your health care provider about your test results, treatment options, and if necessary, the need for more tests. ?Vaccines  ?Your health care provider may recommend certain vaccines, such as: ?Influenza vaccine. This is recommended every year. ?Tetanus, diphtheria, and acellular pertussis (Tdap, Td) vaccine. You may need a Td booster every 10 years. ?Zoster vaccine. You may need this after age 109. ?Pneumococcal 13-valent conjugate (PCV13) vaccine. One dose is recommended after age 65. ?Pneumococcal polysaccharide (PPSV23) vaccine. One dose is recommended  after age 48. ?Talk to your health care provider about which screenings and  vaccines you need and how often you need them. ?This information is not intended to replace advice given to you by your health care provider. Make sure you discuss any questions you have with your health care provider. ?Document Released: 09/24/2015 Document Revised: 05/17/2016 Document Reviewed: 06/29/2015 ?Elsevier Interactive Patient Education ? 2017 [place]. ? ?Fall Prevention in the Home ?Falls can cause injuries. They can happen to people of all ages. There are many things you can do to make your home safe and to help prevent falls. ?What can I do on the outside of my home? ?Regularly fix the edges of walkways and driveways and fix any cracks. ?Remove anything that might make you trip as you walk through a door, such as a raised step or threshold. ?Trim any bushes or trees on the path to your home. ?Use bright outdoor lighting. ?Clear any walking paths of anything that might make someone trip, such as rocks or tools. ?Regularly check to see if handrails are loose or broken. Make sure that both sides of any steps have handrails. ?Any raised decks and porches should have guardrails on the edges. ?Have any leaves, snow, or ice cleared regularly. ?Use sand or salt on walking paths during winter. ?Clean up any spills in your garage right away. This includes oil or grease spills. ?What can I do in the bathroom? ?Use night lights. ?Install grab bars by the toilet and in the tub and shower. Do not use towel bars as grab bars. ?Use non-skid mats or decals in the tub or shower. ?If you need to sit down in the shower, use a plastic, non-slip stool. ?Keep the floor dry. Clean up any water that spills on the floor as soon as it happens. ?Remove soap buildup in the tub or shower regularly. ?Attach bath mats securely with double-sided non-slip rug tape. ?Do not have throw rugs and other things on the floor that can make you trip. ?What can I do in the bedroom? ?Use night lights. ?Make sure that you have a light by your  bed that is easy to reach. ?Do not use any sheets or blankets that are too big for your bed. They should not hang down onto the floor. ?Have a firm chair that has side arms. You can use this for support while you get dressed. ?Do not have throw rugs and other things on the floor that can make you trip. ?What can I do in the kitchen? ?Clean up any spills right away. ?Avoid walking on wet floors. ?Keep items that you use a lot in easy-to-reach places. ?If you need to reach something above you, use a strong step stool that has a grab bar. ?Keep electrical cords out of the way. ?Do not use floor polish or wax that makes floors slippery. If you must use wax, use non-skid floor wax. ?Do not have throw rugs and other things on the floor that can make you trip. ?What can I do with my stairs? ?Do not leave any items on the stairs. ?Make sure that there are handrails on both sides of the stairs and use them. Fix handrails that are broken or loose. Make sure that handrails are as long as the stairways. ?Check any carpeting to make sure that it is firmly attached to the stairs. Fix any carpet that is loose or worn. ?Avoid having throw rugs at the top or bottom of the stairs. If you  do have throw rugs, attach them to the floor with carpet tape. ?Make sure that you have a light switch at the top of the stairs and the bottom of the stairs. If you do not have them, ask someone to add them for you. ?What else can I do to help prevent falls? ?Wear shoes that: ?Do not have high heels. ?Have rubber bottoms. ?Are comfortable and fit you well. ?Are closed at the toe. Do not wear sandals. ?If you use a stepladder: ?Make sure that it is fully opened. Do not climb a closed stepladder. ?Make sure that both sides of the stepladder are locked into place. ?Ask someone to hold it for you, if possible. ?Clearly mark and make sure that you can see: ?Any grab bars or handrails. ?First and last steps. ?Where the edge of each step is. ?Use tools that  help you move around (mobility aids) if they are needed. These include: ?Canes. ?Walkers. ?Scooters. ?Crutches. ?Turn on the lights when you go into a dark area. Replace any light bulbs as soon as they burn out.

## 2022-01-24 ENCOUNTER — Other Ambulatory Visit: Payer: Self-pay

## 2022-01-24 DIAGNOSIS — R200 Anesthesia of skin: Secondary | ICD-10-CM

## 2022-01-24 DIAGNOSIS — G5600 Carpal tunnel syndrome, unspecified upper limb: Secondary | ICD-10-CM | POA: Insufficient documentation

## 2022-01-24 DIAGNOSIS — G5620 Lesion of ulnar nerve, unspecified upper limb: Secondary | ICD-10-CM

## 2022-01-24 HISTORY — DX: Anesthesia of skin: R20.0

## 2022-01-24 HISTORY — DX: Lesion of ulnar nerve, unspecified upper limb: G56.20

## 2022-01-24 HISTORY — DX: Carpal tunnel syndrome, unspecified upper limb: G56.00

## 2022-01-26 ENCOUNTER — Ambulatory Visit: Payer: Medicare HMO | Admitting: Cardiology

## 2022-01-26 ENCOUNTER — Encounter: Payer: Self-pay | Admitting: Cardiology

## 2022-01-26 VITALS — BP 128/54 | HR 82 | Ht 66.6 in | Wt 152.0 lb

## 2022-01-26 DIAGNOSIS — I700 Atherosclerosis of aorta: Secondary | ICD-10-CM | POA: Diagnosis not present

## 2022-01-26 DIAGNOSIS — I10 Essential (primary) hypertension: Secondary | ICD-10-CM

## 2022-01-26 DIAGNOSIS — E785 Hyperlipidemia, unspecified: Secondary | ICD-10-CM | POA: Diagnosis not present

## 2022-01-26 MED ORDER — AMLODIPINE BESYLATE 5 MG PO TABS: 5.0000 mg | ORAL_TABLET | Freq: Every day | ORAL | 3 refills | Status: DC

## 2022-01-26 NOTE — Progress Notes (Signed)
Cardiology Office Note:    Date:  01/26/2022   ID:  [name] Pratt, DOB 1941-01-02, MRN [id number]  PCP:  [name], NP  Cardiologist:  Jenean Lindau, MD   Referring MD: [name], NP    ASSESSMENT:    1. Aortic atherosclerosis ([place])   2. Benign essential hypertension   3. Hyperlipidemia with target LDL less than 130    PLAN:    In order of problems listed above:  Primary prevention stressed with the patient.  Importance of compliance with diet medication stressed any vocalized understanding.  He was advised to ambulate to the best of his ability. Essential hypertension: Blood pressure stable and was emphasized.  Lifestyle modification was urged.  Patient mentions to me that with this blood pressure he does not feel too good and he likes to keep his blood pressure above at least in the 130s or 140s.  I respect his wishe and will reduce amlodipine to 5 mg daily.  He will keep a track of his blood pressures and send to [place] in the next few days. Mixed dyslipidemia: Diet was emphasized and lifestyle modification urged lipids were reviewed. Aortic atherosclerosis: Stable and we will continue to monitor. Patient will be seen in follow-up appointment in 6 months or earlier if the patient has any concerns    Medication Adjustments/Labs and Tests Ordered: Current medicines are reviewed at length with the patient today.  Concerns regarding medicines are outlined above.  No orders of the defined types were placed in this encounter.  No orders of the defined types were placed in this encounter.    No chief complaint on file.    History of Present Illness:    [name] Pratt is a 81 y.o. male.  Patient has past medical history of aortic atherosclerosis essential hypertension and dyslipidemia.  He denies any problems at this time and takes care of activities of daily living.  No chest pain orthopnea or PND.  He has orthopedic issues and has had neck surgery  and carpal tunnel surgery but currently recovering from this.  His gait is stable.  He ambulates with a cane.  At the time of my evaluation, the patient is alert awake oriented and in no distress.  He appears frail.  Past Medical History:  Diagnosis Date   Allergic rhinitis 02/02/2016   Anemia 12/17/2009   Formatting of this note might be different from the original. 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 Carotid U/S 0/92: LICA < 33%, RICA 00-76%;  b.  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  Cystoid macular edema of both eyes 07/28/2020   Degenerative disc disease, lumbar 06/30/2015   Deviated nasal septum 02/18/2015   Diverticulosis 03/03/2020   DJD (degenerative joint disease) of hip 12/24/2012   Dyspnea 06/12/2011   Emphysema, unspecified ([place]) 03/05/2020   Erectile dysfunction 06/20/2017   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[place]) 02/09/2021   Gait disorder 03/02/2021   GERD (gastroesophageal reflux disease)    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   Qualifier: Diagnosis of   By: Burnett Kanaris     Greater trochanteric pain syndrome 07/22/2021   Hand pain, left 01/16/2022   Hearing loss 02/02/2016   Heme positive stool 02/29/2020   HIATAL HERNIA 05/13/2009   Qualifier: Diagnosis of  By: Burnett Kanaris     High risk medication use 01/18/2012   Hip pain 03/02/2021   History of chicken pox    History of COVID-19 12/05/2021   History of hemorrhoids    History of kidney stones    History of skin cancer 07/22/2021   skin cancer   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 Osteoarthritis Of The Hand  10/1 IMO update   Osteoarthrosis, unspecified whether generalized or localized, pelvic region and thigh 07/25/2010   Formatting of this note might be different from the original. Osteoarthritis Of The Hip   Other abnormal glucose 07/22/2021   Other allergic rhinitis 02/18/2015   Palpitations 07/25/2021   Peripheral neuropathy 06/19/2016   Preventative health care 11/24/2015   Right groin pain 11/29/2012   Right inguinal hernia 05/13/2009   Qualifier: Diagnosis of  By: Burnett Kanaris     RLS (restless legs syndrome) 02/18/2015   Rosacea    Rotator cuff tear  07/27/2013   Situational depression 03/02/2021   Skin lesion 01/16/2022   Sleep apnea with use of continuous positive airway pressure (CPAP) 07/15/2013   CPAP set to  7 cm water,  Residual AHi was 7.8  With no leak.  User time 6 hours.  479 days , not used only 12 days - highly compliant 06-23-13  .    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   Urinary retention 11/26/2020   Ventral hernia 07/08/2012   Vertigo 07/08/2021   Weight loss 03/03/2020    Past Surgical History:  Procedure Laterality Date   ABDOMINAL HERNIA REPAIR  07/15/12   Dr [name]   ANTERIOR CERVICAL  DECOMP/DISCECTOMY FUSION N/A 07/09/2020   Procedure: ANTERIOR CERVICAL DECOMPRESSION AND FUSION CERVICAL THREE-FOUR.;  Surgeon: [name], MD;  Location: [facility];  Service: Neurosurgery;  Laterality: N/A;  anterior   BROW LIFT  05/07/01   CYSTOSCOPY WITH URETEROSCOPY AND STENT PLACEMENT Right 01/28/2014   Procedure: CYSTOSCOPY WITH URETEROSCOPY, BASKET RETRIVAL AND  STENT PLACEMENT;  Surgeon: [name], MD;  Location: [facility];  Service: Urology;  Laterality: Right;   epidural injections     multiple procedures   ESOPHAGEAL DILATION  1993 and 1994   multiple times   EYE SURGERY Bilateral 03/25/10, 2012   cataract removal   HEMORRHOID SURGERY  2014   HIATAL HERNIA REPAIR  12/21/93   HOLMIUM LASER APPLICATION Right 03/10/1600   Procedure: HOLMIUM LASER APPLICATION;  Surgeon: [name], MD;  Location: [facility];  Service: Urology;  Laterality: Right;   INGUINAL HERNIA REPAIR Right 07/15/12   Dr [name], x2   JOINT REPLACEMENT  07/25/04   right knee   JOINT REPLACEMENT  07/13/10   left knee   [place]  09/20/06   with intraoperative cholangiogram and right inguinal herniorrhaphy with mesh    LIGAMENT REPAIR Left 07/2013   shoulder   LITHOTRIPSY     x2   [place] FUSION/FORAMINOTOMY N/A 11/24/2020   Procedure: Posterior Cervical Laminectomy Cervical three-four, Cervical four-five with lateral mass fusion;  Surgeon: [name], MD;  Location: [facility];  Service: Neurosurgery;  Laterality: N/A;   REFRACTIVE SURGERY Bilateral    Right total hip replacement  4/14   SKIN CANCER DESTRUCTION     nose, ear   TONSILLECTOMY  as child   TOTAL HIP ARTHROPLASTY Left    URETHRAL DILATION  1991   [name]    Current Medications: Current Meds  Medication Sig   acetaminophen (TYLENOL) 650 MG CR tablet Take 1,300 mg by mouth every morning. And take 650 mg at bedtime   albuterol (VENTOLIN HFA) 108 (90 Base) MCG/ACT inhaler INHALE 2 PUFFS INTO THE LUNGS EVERY 6 HOURS AS NEEDED FOR SHORTNESS OF BREATH.   Alcohol Swabs (ALCOHOL PADS) 70 % PADS 1 packet by Does not apply route 2 (two) times daily.   allopurinol (ZYLOPRIM) 100 MG tablet Take 100 mg by mouth 2 (two) times daily.   amLODipine (NORVASC) 10 MG tablet TAKE 1 TABLET EVERY DAY   ampicillin (PRINCIPEN) 500 MG capsule Take 1 capsule (500 mg total) by mouth 4 (four) times daily.   aspirin EC 81 MG tablet Take 81 mg by mouth every morning.   Cholecalciferol (VITAMIN D) 50 MCG (2000 UT) CAPS Take 2,000 Units by mouth daily.   colchicine 0.6 MG tablet Take 0.6 mg by mouth as needed for other. For gout   cycloSPORINE (RESTASIS) 0.05 % ophthalmic emulsion Place 1 drop into both eyes 2 (two) times daily.   diclofenac Sodium (VOLTAREN) 1 % GEL Apply 1 application topically daily as needed for pain (Hand pain).   fluocinonide (LIDEX) 0.05 % external solution Apply 1 application topically 2 (two) times daily as needed for itching or pain.   gabapentin (NEURONTIN) 300 MG  capsule TAKE 1 CAPSULE TWO TO THREE TIMES DAILY AS NEEDED   GEMTESA 75 MG TABS Take 1 tablet by mouth daily.   glucose blood test strip TRUE Metrix Blood Glucose Test Strip, use as instructed   Homeopathic Products (THERAWORX RELIEF) FOAM Apply 1 application topically  2 (two) times daily as needed (Pain).   hydrALAZINE (APRESOLINE) 50 MG tablet Take 1 tablet (50 mg total) by mouth 2 (two) times daily.   Lancets MISC 1 each by Does not apply route 2 (two) times daily. TRUE matrix lancets   levothyroxine (SYNTHROID) 150 MCG tablet Take 150 mcg 6 days a week (patient takes 75 mcg once a week) Disp #84 for 90 day supply   levothyroxine (SYNTHROID) 75 MCG tablet Take 75 mcg once a week (patient on 150 mcg 6 days a week) Dispense #24 for 90 day supply.   losartan (COZAAR) 100 MG tablet TAKE 1 TABLET EVERY DAY   Magnesium 500 MG TABS Take 500 mg by mouth daily.   meclizine (ANTIVERT) 25 MG tablet Take 1 tablet (25 mg total) by mouth 3 (three) times daily as needed for dizziness.   multivitamin (THERAGRAN) per tablet Take 1 tablet by mouth daily.   nitroGLYCERIN (NITROSTAT) 0.4 MG SL tablet Place 1 tablet (0.4 mg total) under the tongue every 5 (five) minutes as needed for chest pain.   ondansetron (ZOFRAN-ODT) 4 MG disintegrating tablet Take 1 tablet (4 mg total) by mouth every 8 (eight) hours as needed for nausea or vomiting.   polyethylene glycol (MIRALAX / GLYCOLAX) 17 g packet Take 17-34 g by mouth daily as needed for constipation.   rosuvastatin (CRESTOR) 5 MG tablet TAKE 1 TABLET EVERY DAY   sodium chloride (OCEAN) 0.65 % nasal spray Place 1 spray into both nostrils at bedtime as needed for congestion.   traMADol (ULTRAM) 50 MG tablet Take 1 tablet (50 mg total) by mouth every 12 (twelve) hours as needed.     Allergies:   Pravastatin, Sildenafil, Oxycodone, Atenolol, Elastic bandages & [zinc], and Metoclopramide hcl   Social History   Socioeconomic History   Marital status: Married     Spouse name: Not on file   Number of children: 1   Years of education: Not on file   Highest education level: Not on file  Occupational History   Occupation: firefighter    Comment: paints on the side  Tobacco Use   Smoking status: Former    Years: 11.00    Types: Cigarettes    Quit date: 09/11/1978    Years since quitting: 43.4   Smokeless tobacco: Never  Vaping Use   Vaping Use: Never used  Substance and Sexual Activity   Alcohol use: Not Currently    Alcohol/week: 0.0 standard drinks    Comment: rare   Drug use: No   Sexual activity: Not Currently  Other Topics Concern   Not on file  Social History Narrative   Lives with his wife   He has one son- lives locally.   2 grandchildren   Has worked for Research officer, trade union   Enjoys wood working   Completed HS.  [employer]   Social Determinants of Radio broadcast assistant Strain: Low Risk    Difficulty of Paying Living Expenses: Not very hard  Food Insecurity: No Food Insecurity   Worried About Charity fundraiser in the Last Year: Never true   Arboriculturist in the Last Year: Never true  Transportation Needs: No Transportation Needs   Lack of Transportation (Medical): No   Lack of Transportation (Non-Medical): No  Physical Activity: Insufficiently Active   Days of Exercise per Week: 2 days   Minutes of Exercise per Session: 30 min  Stress: No Stress Concern Present   Feeling of Stress :  Only a little  Social Connections: Moderately Isolated   Frequency of Communication with Friends and Family: More than three times a week   Frequency of Social Gatherings with Friends and Family: More than three times a week   Attends Religious Services: Never   Marine scientist or Organizations: No   Attends Music therapist: Never   Marital Status: Married     Family History: The patient's family history includes Cancer in his mother and another family member; Hypertension in an other family member;  Osteoarthritis in an other family member.  ROS:   Please see the history of present illness.    All other systems reviewed and are negative.  EKGs/Labs/Other Studies Reviewed:    The following studies were reviewed today: I discussed my findings with the patient at length.   Recent Labs: 06/15/2021: TSH 2.77 09/13/2021: Magnesium 2.2 11/17/2021: ALT 22; B Natriuretic Peptide 170.2; BUN 28; Creatinine, Ser 1.24; Hemoglobin 12.1; Platelets 318; Potassium 3.7; Sodium 141  Recent Lipid Panel    Component Value Date/Time   CHOL 117 09/13/2021 1008   TRIG 80.0 09/13/2021 1008   HDL 50.40 09/13/2021 1008   CHOLHDL 2 09/13/2021 1008   VLDL 16.0 09/13/2021 1008   LDLCALC 50 09/13/2021 1008   LDLCALC 50 06/15/2020 1235    Physical Exam:    VS:  BP (!) 128/54   Pulse 82   Ht 5' 6.6" (1.692 m)   Wt 152 lb (68.9 kg)   SpO2 98%   BMI 24.09 kg/m     Wt Readings from Last 3 Encounters:  01/26/22 152 lb (68.9 kg)  01/16/22 149 lb (67.6 kg)  01/13/22 159 lb (72.1 kg)     GEN: Patient is in no acute distress HEENT: Normal NECK: No JVD; No carotid bruits LYMPHATICS: No lymphadenopathy CARDIAC: Hear sounds regular, 2/6 systolic murmur at the apex. RESPIRATORY:  Clear to auscultation without rales, wheezing or rhonchi  ABDOMEN: Soft, non-tender, non-distended MUSCULOSKELETAL:  No edema; No deformity  SKIN: Warm and dry NEUROLOGIC:  Alert and oriented x 3 PSYCHIATRIC:  Normal affect   Signed, Jenean Lindau, MD  01/26/2022 1:30 PM    [facility]

## 2022-01-26 NOTE — Patient Instructions (Addendum)
Please keep a BP log for 2 weeks and send by MyChart or mail.  Blood Pressure Record Sheet To take your blood pressure, you will need a blood pressure machine. You can buy a blood pressure machine (blood pressure monitor) at your clinic, drug store, or online. When choosing one, consider: An automatic monitor that has an arm cuff. A cuff that wraps snugly around your upper arm. You should be able to fit only one finger between your arm and the cuff. A device that stores blood pressure reading results. Do not choose a monitor that measures your blood pressure from your wrist or finger. Follow your health care provider's instructions for how to take your blood pressure. To use this form: Get one reading in the morning (a.m.) 1-2 hours after you take any medicines. Get one reading in the evening (p.m.) before supper. Write down the results in the spaces on this form. Repeat this once a week, or as told by your health care provider.  Make a follow-up appointment with your health care provider to discuss the results. Blood pressure log Date: _______________________ a.m. _____________________(1st reading) HR___________            p.m. _____________________(2nd reading) HR__________  Date: _______________________ a.m. _____________________(1st reading) HR___________            p.m. _____________________(2nd reading) HR__________ Date: _______________________ a.m. _____________________(1st reading) HR___________            p.m. _____________________(2nd reading) HR__________ Date: _______________________ a.m. _____________________(1st reading) HR___________            p.m. _____________________(2nd reading) HR__________  Date: _______________________ a.m. _____________________(1st reading) HR___________            p.m. _____________________(2nd reading) HR__________  Date: _______________________ a.m. _____________________(1st reading) HR___________            p.m.  _____________________(2nd reading) HR__________  Date: _______________________ a.m. _____________________(1st reading) HR___________            p.m. _____________________(2nd reading) HR__________   This information is not intended to replace advice given to you by your health care provider. Make sure you discuss any questions you have with your health care provider. Document Revised: 12/17/2019 Document Reviewed: 12/17/2019 Elsevier Patient Education  [place].    Medication Instructions:  Your physician has recommended you make the following change in your medication:   Decrease your Amlodipine 5 mg daily.Cut your current dose in 1/2 and the next refill will be for a 5 mg tablet.  *If you need a refill on your cardiac medications before your next appointment, please call your pharmacy*   Lab Work: None ordered If you have labs (blood work) drawn today and your tests are completely normal, you will receive your results only by: [place] (if you have MyChart) OR A paper copy in the mail If you have any lab test that is abnormal or we need to change your treatment, we will call you to review the results.   Testing/Procedures: None ordered   Follow-Up: At [facility], you and your health needs are our priority.  As part of our continuing mission to provide you with exceptional heart care, we have created designated Provider Care Teams.  These Care Teams include your primary Cardiologist (physician) and Advanced Practice Providers (APPs -  Physician Assistants and Nurse Practitioners) who all work together to provide you with the care you need, when you need it.  We recommend signing up for the patient portal called "MyChart".  Sign up information is  provided on this After Visit Summary.  MyChart is used to connect with patients for Virtual Visits (Telemedicine).  Patients are able to view lab/test results, encounter notes, upcoming appointments, etc.  Non-urgent  messages can be sent to your provider as well.   To learn more about what you can do with MyChart, go to [email or web address].    Your next appointment:   12 month(s)  The format for your next appointment:   In Person  Provider:   Jyl Heinz, MD   Other Instructions NA

## 2022-01-30 ENCOUNTER — Encounter (INDEPENDENT_AMBULATORY_CARE_PROVIDER_SITE_OTHER): Payer: Self-pay | Admitting: Ophthalmology

## 2022-01-30 ENCOUNTER — Ambulatory Visit (INDEPENDENT_AMBULATORY_CARE_PROVIDER_SITE_OTHER): Payer: Medicare HMO | Admitting: Ophthalmology

## 2022-01-30 DIAGNOSIS — H353211 Exudative age-related macular degeneration, right eye, with active choroidal neovascularization: Secondary | ICD-10-CM

## 2022-01-30 DIAGNOSIS — H35073 Retinal telangiectasis, bilateral: Secondary | ICD-10-CM

## 2022-01-30 MED ORDER — BEVACIZUMAB 2.5 MG/0.1ML IZ SOSY
2.5000 mg | PREFILLED_SYRINGE | INTRAVITREAL | Status: AC | PRN
  Administered 2022-01-30: 2.5 mg via INTRAVITREAL

## 2022-01-30 NOTE — Assessment & Plan Note (Signed)
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  Repeat injection today to maintain

## 2022-01-30 NOTE — Progress Notes (Signed)
01/30/2022     CHIEF COMPLAINT Patient presents for  Chief Complaint  Patient presents with   Macular Degeneration      HISTORY OF PRESENT ILLNESS: [name] Pratt is a 81 y.o. male who presents to the clinic today for:   HPI   8 week dilate OCT Avastin OD. Patient reports "my vision is off and on. I am not getting much sleep. My CPAP is acting up on me. I am putting it on low, but it starts blowing harder and blowing the mask off my face. I changed my mask, I got new head gear and tried moving my head around but it is the same thing. I go move to the recliner to sleep. My wife has an implant."  History of peripapillary CNVM OD Last edited by Hurman Horn, MD on 01/30/2022  2:52 PM.      Referring physician: [name], NP [address] [place],  [place] [id number]  HISTORICAL INFORMATION:   Selected notes from the MEDICAL RECORD NUMBER    Lab Results  Component Value Date   HGBA1C 5.7 12/29/2020     CURRENT MEDICATIONS: Current Outpatient Medications (Ophthalmic Drugs)  Medication Sig   cycloSPORINE (RESTASIS) 0.05 % ophthalmic emulsion Place 1 drop into both eyes 2 (two) times daily.   No current facility-administered medications for this visit. (Ophthalmic Drugs)   Current Outpatient Medications (Other)  Medication Sig   acetaminophen (TYLENOL) 650 MG CR tablet Take 1,300 mg by mouth every morning. And take 650 mg at bedtime   albuterol (VENTOLIN HFA) 108 (90 Base) MCG/ACT inhaler INHALE 2 PUFFS INTO THE LUNGS EVERY 6 HOURS AS NEEDED FOR SHORTNESS OF BREATH.   Alcohol Swabs (ALCOHOL PADS) 70 % PADS 1 packet by Does not apply route 2 (two) times daily.   allopurinol (ZYLOPRIM) 100 MG tablet Take 100 mg by mouth 2 (two) times daily.   amLODipine (NORVASC) 5 MG tablet Take 1 tablet (5 mg total) by mouth daily.   ampicillin (PRINCIPEN) 500 MG capsule Take 1 capsule (500 mg total) by mouth 4 (four) times daily.   aspirin EC 81 MG tablet  Take 81 mg by mouth every morning.   Cholecalciferol (VITAMIN D) 50 MCG (2000 UT) CAPS Take 2,000 Units by mouth daily.   colchicine 0.6 MG tablet Take 0.6 mg by mouth as needed for other. For gout   diclofenac Sodium (VOLTAREN) 1 % GEL Apply 1 application topically daily as needed for pain (Hand pain).   fluocinonide (LIDEX) 0.05 % external solution Apply 1 application topically 2 (two) times daily as needed for itching or pain.   gabapentin (NEURONTIN) 300 MG capsule TAKE 1 CAPSULE TWO TO THREE TIMES DAILY AS NEEDED   GEMTESA 75 MG TABS Take 1 tablet by mouth daily.   glucose blood test strip TRUE Metrix Blood Glucose Test Strip, use as instructed   Homeopathic Products (THERAWORX RELIEF) FOAM Apply 1 application topically 2 (two) times daily as needed (Pain).   hydrALAZINE (APRESOLINE) 50 MG tablet Take 1 tablet (50 mg total) by mouth 2 (two) times daily.   Lancets MISC 1 each by Does not apply route 2 (two) times daily. TRUE matrix lancets   levothyroxine (SYNTHROID) 150 MCG tablet Take 150 mcg 6 days a week (patient takes 75 mcg once a week) Disp #84 for 90 day supply   levothyroxine (SYNTHROID) 75 MCG tablet Take 75 mcg once a week (patient on 150 mcg 6 days a  week) Dispense #24 for 90 day supply.   losartan (COZAAR) 100 MG tablet TAKE 1 TABLET EVERY DAY   Magnesium 500 MG TABS Take 500 mg by mouth daily.   meclizine (ANTIVERT) 25 MG tablet Take 1 tablet (25 mg total) by mouth 3 (three) times daily as needed for dizziness.   multivitamin (THERAGRAN) per tablet Take 1 tablet by mouth daily.   nitroGLYCERIN (NITROSTAT) 0.4 MG SL tablet Place 1 tablet (0.4 mg total) under the tongue every 5 (five) minutes as needed for chest pain.   ondansetron (ZOFRAN-ODT) 4 MG disintegrating tablet Take 1 tablet (4 mg total) by mouth every 8 (eight) hours as needed for nausea or vomiting.   polyethylene glycol (MIRALAX / GLYCOLAX) 17 g packet Take 17-34 g by mouth daily as needed for constipation.    rosuvastatin (CRESTOR) 5 MG tablet TAKE 1 TABLET EVERY DAY   sodium chloride (OCEAN) 0.65 % nasal spray Place 1 spray into both nostrils at bedtime as needed for congestion.   traMADol (ULTRAM) 50 MG tablet Take 1 tablet (50 mg total) by mouth every 12 (twelve) hours as needed.   No current facility-administered medications for this visit. (Other)      REVIEW OF SYSTEMS: ROS   Negative for: Constitutional, Gastrointestinal, Neurological, Skin, Genitourinary, Musculoskeletal, HENT, Endocrine, Cardiovascular, Eyes, Respiratory, Psychiatric, Allergic/Imm, Heme/Lymph Last edited by Hurman Horn, MD on 01/30/2022  2:49 PM.       ALLERGIES Allergies  Allergen Reactions   Pravastatin Other (See Comments)    Muscle cramps   Sildenafil Other (See Comments)    Tachycardia     Oxycodone     hallucinations   Atenolol Other (See Comments)    bradycardia   Elastic Bandages & [Zinc] Other (See Comments)    Teflon bandages - Irritation   Metoclopramide Hcl Anxiety    PAST MEDICAL HISTORY Past Medical History:  Diagnosis Date   Allergic rhinitis 02/02/2016   Anemia 12/17/2009   Formatting of this note might be different from the original. 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 Hypertension   Benign paroxysmal positional vertigo 09/14/2021   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[place]) 07/22/2021   Body mass index (BMI) 25.0-25.9, adult 11/02/2020   Cancer (HCC)    skin cancer   Cardiac murmur 07/25/2021   Carotid stenosis    a. Carotid U/S 6/22: LICA < 63%, RICA 33-54%;  b.  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   Cystoid macular edema of both eyes 07/28/2020   Degenerative disc disease, lumbar 06/30/2015   Deviated nasal septum 02/18/2015   Diverticulosis 03/03/2020   DJD (degenerative joint disease) of hip 12/24/2012   Dyspnea 06/12/2011   Emphysema, unspecified ([place]) 03/05/2020   Erectile dysfunction 06/20/2017   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[place]) 02/09/2021   Gait disorder 03/02/2021  GERD (gastroesophageal reflux disease)    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   Qualifier: Diagnosis of  By: Burnett Kanaris     Greater trochanteric pain syndrome 07/22/2021   Hand pain, left 01/16/2022   Hearing loss 02/02/2016   Heme positive stool 02/29/2020   HIATAL HERNIA 05/13/2009   Qualifier: Diagnosis of  By: Burnett Kanaris     High risk medication use 01/18/2012   Hip pain 03/02/2021   History of chicken pox    History of COVID-19 12/05/2021   History of hemorrhoids    History of kidney stones    History of skin cancer 07/22/2021   skin cancer   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 Osteoarthritis Of The Hand  10/1 IMO update   Osteoarthrosis, unspecified whether generalized or localized, pelvic region and thigh 07/25/2010   Formatting of this note might be different from the original. Osteoarthritis Of The Hip   Other abnormal glucose 07/22/2021   Other allergic rhinitis 02/18/2015   Palpitations 07/25/2021   Peripheral neuropathy 06/19/2016   Preventative health care 11/24/2015   Right groin pain 11/29/2012   Right inguinal hernia 05/13/2009   Qualifier: Diagnosis of  By: Burnett Kanaris     RLS (restless legs syndrome) 02/18/2015   Rosacea    Rotator cuff tear 07/27/2013   Situational depression 03/02/2021   Skin lesion 01/16/2022   Sleep apnea with use of continuous positive airway pressure (CPAP) 07/15/2013   CPAP set to  7 cm water,  Residual AHi was 7.8  With no leak.  User time 6 hours.  479 days , not used only 12 days - highly compliant 06-23-13  .    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   Urinary retention 11/26/2020   Ventral hernia 07/08/2012   Vertigo 07/08/2021   Weight loss 03/03/2020   Past Surgical History:  Procedure Laterality Date   ABDOMINAL HERNIA REPAIR  07/15/12   Dr [name]   ANTERIOR CERVICAL DECOMP/DISCECTOMY FUSION N/A 07/09/2020   Procedure: ANTERIOR CERVICAL DECOMPRESSION AND FUSION CERVICAL THREE-FOUR.;  Surgeon: [name], MD;  Location: [place];  Service: Neurosurgery;  Laterality: N/A;  anterior   BROW  LIFT  05/07/01   CYSTOSCOPY WITH URETEROSCOPY AND STENT PLACEMENT Right 01/28/2014   Procedure: CYSTOSCOPY WITH URETEROSCOPY, BASKET RETRIVAL AND  STENT PLACEMENT;  Surgeon: [name], MD;  Location: [facility];  Service:  Urology;  Laterality: Right;   epidural injections     multiple procedures   ESOPHAGEAL DILATION  1993 and 1994   multiple times   EYE SURGERY Bilateral 03/25/10, 2012   cataract removal   HEMORRHOID SURGERY  2014   HIATAL HERNIA REPAIR  12/21/93   HOLMIUM LASER APPLICATION Right 04/28/2992   Procedure: HOLMIUM LASER APPLICATION;  Surgeon: [name], MD;  Location: [facility];  Service: Urology;  Laterality: Right;   INGUINAL HERNIA REPAIR Right 07/15/12   Dr [name], x2   JOINT REPLACEMENT  07/25/04   right knee   JOINT REPLACEMENT  07/13/10   left knee   [place]  09/20/06   with intraoperative cholangiogram and right inguinal herniorrhaphy with mesh    LIGAMENT REPAIR Left 07/2013   shoulder   LITHOTRIPSY     x2   [place] FUSION/FORAMINOTOMY N/A 11/24/2020   Procedure: Posterior Cervical Laminectomy Cervical three-four, Cervical four-five with lateral mass fusion;  Surgeon: [name], MD;  Location: [place];  Service: Neurosurgery;  Laterality: N/A;   REFRACTIVE SURGERY Bilateral    Right total hip replacement  4/14   SKIN CANCER DESTRUCTION     nose, ear   TONSILLECTOMY  as child   TOTAL HIP ARTHROPLASTY Left    URETHRAL DILATION  1991   [name]    FAMILY HISTORY Family History  Problem Relation Age of Onset   Cancer Mother    Hypertension Other    Cancer Other    Osteoarthritis Other     SOCIAL HISTORY Social History   Tobacco Use   Smoking status: Former    Years: 11.00    Types: Cigarettes    Quit date: 09/11/1978    Years since quitting: 43.4   Smokeless tobacco: Never  Vaping Use   Vaping Use: Never used  Substance Use Topics   Alcohol use: Not Currently     Alcohol/week: 0.0 standard drinks    Comment: rare   Drug use: No         OPHTHALMIC EXAM:  Base Eye Exam     Visual Acuity (ETDRS)       Right Left   Dist [place] 20/60 -1 20/30 -1   Dist ph [place] NI 20/25 -1         Tonometry (Tonopen, 2:00 PM)       Right Left   Pressure 12 9         Pupils       Pupils Dark Light React APD   Right PERRL 2 2 Minimal None   Left PERRL 2 2 Minimal None         Visual Fields       Left Right    Full Full         Extraocular Movement       Right Left    Full, Ortho Full, Ortho         Neuro/Psych     Oriented x3: Yes   Mood/Affect: Normal         Dilation     Right eye: 1.0% Mydriacyl, 2.5% Phenylephrine @ 2:00 PM           Slit Lamp and Fundus Exam     External Exam       Right Left   External Normal Normal         Slit Lamp Exam  Right Left   Lids/Lashes Normal Normal   Conjunctiva/Sclera White and quiet White and quiet   Cornea Clear Clear   Anterior Chamber Deep and quiet Deep and quiet   [place] PI PI   Lens Clear Clear   Anterior Vitreous Normal Normal         Fundus Exam       Right Left   Posterior Vitreous Posterior vitreous detachment    Disc Normal    C/D Ratio 0.5    Macula No overt CME, angulated vessels temporally, with thickening along the p.m. bundle.  Much less    Vessels Normal    Periphery Normal             IMAGING AND PROCEDURES  Imaging and Procedures for 01/30/22  OCT, Retina - OU - Both Eyes       Right Eye Quality was good. Scan locations included subfoveal. Central Foveal Thickness: 227. Progression has worsened. Findings include abnormal foveal contour, cystoid macular edema.   Left Eye Quality was good. Scan locations included subfoveal. Central Foveal Thickness: 211. Progression has been stable. Findings include abnormal foveal contour, cystoid macular edema.   Notes Chronic active CME OD and with peripapillary CNVM and thickening now  stable 8 weeks post intravitreal Avastin OD.  We will repeat to control the peripapillary CNVM.  Chronic MAC-TEL continues OU     Intravitreal Injection, Pharmacologic Agent - OD - Right Eye       Time Out 01/30/2022. 2:51 PM. Confirmed correct patient, procedure, site, and patient consented.   Anesthesia Topical anesthesia was used. Anesthetic medications included Lidocaine 4%.   Procedure Preparation included Tobramycin 0.3%, 10% betadine to eyelids, 5% betadine to ocular surface. A 30 gauge needle was used.   Injection: 2.5 mg bevacizumab 2.5 MG/0.1ML   Route: Intravitreal, Site: Right Eye   NDC: [phone], Lot: [id number], Expiration date: 03/16/2022   Post-op Post injection exam found visual acuity of at least counting fingers, no retinal detachment, perfused optic nerve. The patient tolerated the procedure well. There were no complications. The patient received written and verbal post procedure care education. Post injection medications included ocuflox.              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  Repeat injection today to maintain     ICD-10-CM   1. 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HCC)  H35.3211 OCT, Retina - OU - Both Eyes    Intravitreal Injection, Pharmacologic Agent - OD - Right Eye    bevacizumab (AVASTIN) SOSY 2.5 mg    2. 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  Type II MAC-TEL, no overt increase in central thickness or CME.  OU.  Encourage patient to continue with efforts to make CPAP work efficiently  2.  Peripapillary CNVM OD.  Controlled now on Avastin  but still active.  Repeat injection today MRI and maintain follow-up examination interval of 8 weeks  3.  Ophthalmic Meds Ordered this visit:  Meds ordered this encounter  Medications   bevacizumab (AVASTIN) SOSY 2.5 mg       Return in about 8 weeks (around 03/27/2022) for DILATE OU, AVASTIN OCT, OD.  There are no Patient Instructions on file for this visit.   Explained the diagnoses, plan, and follow up with the patient and they expressed understanding.  Patient expressed understanding of the importance of proper follow up care.   Clent Demark Genene Kilman M.D. Diseases & Surgery of the Retina and Vitreous [facility]ina [facility] Diabetic [place] 01/30/22     Abbreviations: M myopia (nearsighted); A astigmatism; H hyperopia (farsighted); P presbyopia; Mrx spectacle prescription;  CTL contact lenses; OD right eye; OS left eye; OU both eyes  XT exotropia; ET esotropia; PEK punctate epithelial keratitis; PEE punctate epithelial erosions; DES dry eye syndrome; MGD meibomian gland dysfunction; ATs artificial tears; PFAT's preservative free artificial tears; [place] nuclear sclerotic cataract; PSC posterior subcapsular cataract; ERM epi-retinal membrane; PVD posterior vitreous detachment; RD retinal detachment; DM diabetes mellitus; DR diabetic retinopathy; NPDR non-proliferative diabetic retinopathy; PDR proliferative diabetic retinopathy; CSME clinically significant macular edema; DME diabetic macular edema; dbh dot blot hemorrhages; CWS cotton wool spot; POAG primary open angle glaucoma; C/D cup-to-disc ratio; HVF humphrey visual field; GVF goldmann visual field; OCT optical coherence tomography; IOP intraocular pressure; BRVO Branch retinal vein occlusion; CRVO central retinal vein occlusion; CRAO central retinal artery occlusion; BRAO branch retinal artery occlusion; RT retinal tear; SB scleral buckle; PPV pars plana vitrectomy; VH Vitreous hemorrhage; PRP panretinal laser photocoagulation; IVK intravitreal kenalog;  VMT vitreomacular traction; MH Macular hole;  NVD neovascularization of the disc; NVE neovascularization elsewhere; AREDS age related eye disease study; ARMD age related macular degeneration; POAG primary open angle glaucoma; EBMD epithelial/anterior basement membrane dystrophy; ACIOL anterior chamber intraocular lens; IOL intraocular lens; PCIOL posterior chamber intraocular lens; Phaco/IOL phacoemulsification with intraocular lens placement; [place] photorefractive keratectomy; LASIK laser assisted in situ keratomileusis; HTN hypertension; DM diabetes mellitus; COPD chronic obstructive pulmonary disease

## 2022-01-30 NOTE — Assessment & Plan Note (Signed)
Bilateral MAC-TEL, having some mask issues now but otherwise quiescent disease centrally in the fovea.

## 2022-02-03 ENCOUNTER — Ambulatory Visit (INDEPENDENT_AMBULATORY_CARE_PROVIDER_SITE_OTHER): Payer: Medicare HMO | Admitting: Family

## 2022-02-03 VITALS — BP 155/66 | HR 66 | Temp 97.5°F | Resp 16 | Wt 149.0 lb

## 2022-02-03 DIAGNOSIS — I499 Cardiac arrhythmia, unspecified: Secondary | ICD-10-CM

## 2022-02-03 DIAGNOSIS — L989 Disorder of the skin and subcutaneous tissue, unspecified: Secondary | ICD-10-CM | POA: Diagnosis not present

## 2022-02-03 DIAGNOSIS — R2681 Unsteadiness on feet: Secondary | ICD-10-CM | POA: Diagnosis not present

## 2022-02-03 DIAGNOSIS — K5900 Constipation, unspecified: Secondary | ICD-10-CM

## 2022-02-03 HISTORY — DX: Constipation, unspecified: K59.00

## 2022-02-03 HISTORY — DX: Cardiac arrhythmia, unspecified: I49.9

## 2022-02-03 NOTE — Assessment & Plan Note (Signed)
New.  Has been present for a long time per patient. Very remote trauma. I would like dermatology to look at this to exclude melanoma.

## 2022-02-03 NOTE — Progress Notes (Signed)
Subjective:   By signing my name below, I, Carylon Perches, attest that this documentation has been prepared under the direction and in the presence of [name] NP, 02/03/2022   Patient ID: [name] Pratt, male    DOB: 1940-09-20, 81 y.o.   MRN: [id number]  Chief Complaint  Patient presents with   GI Problem    Complains of occasional abdominal pain (after eating), constipation and diarrhea.     HPI Patient is in today for an office visit.  Constipation - He complains of constipation. He reports that he initially experiences gas and belching but symptoms are eventually relieved . He goes to the restroom and notices constipation. He reports that he doesn't strain to produce a bowel movement. When he produces some bowel movement, he feels fine. He describes the bowels as occasionally long. Once he relaxes in the bathroom, more bowels are produced. If he can't use the restroom, he will feel a sudden urge to produce a bowel movement. He reports that he drinks fluid all day but doesn't feel like he drinks enough fluid. He sips on a few bottle waters. When he had surgery, he reports that he drunk fluids all day but experienced increased urinary frequency. He's currently taking about a teaspoon of OTC digestive powder during both mornings and nights. Was previously taking Cholestyramine. He is currently taking probiotic. He denies of any abdominal pain.   Blood Pressure - He reports that he has seen Dr.Revankar for cardiology and was switched from 10 Mg of Amlodipine to 5 Mg of Amlodipine. His systolic ranges from 469 - 629 and diastolic ranges from 63 - 78.  BP Readings from Last 3 Encounters:  02/03/22 (!) 155/66  01/26/22 (!) 128/54  01/16/22 (!) 116/56   Pulse Readings from Last 3 Encounters:  02/03/22 66  01/26/22 82  01/16/22 66    Carpal tunnel - He complains of worsening carpal tunnel. He reports that it hurts to wrap his left hand and grab items. He also reports that he  previously had trauma on his left nailbed. He does have a dermatologist.  Knee Weakness - He complains of increased knee weakness. He reports that weakness is worse currently compared to his previous surgery. He was previously going to physical therapy and states that it was improving his symptoms. He is interested restarting physical therapy.  Cancer - He is inquiring about how to catch early symptoms of cancer.    Health Maintenance Due  Topic Date Due   TETANUS/TDAP  12/26/2020   FOOT EXAM  09/29/2021    Past Medical History:  Diagnosis Date   Allergic rhinitis 02/02/2016   Anemia 12/17/2009   Formatting of this note might be different from the original. Anemia  10/1 IMO update   Aortic atherosclerosis ([place]) 03/05/2020   Arthritis    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   Atypical chest pain 11/24/2015   Benign essential hypertension 05/13/2009   Qualifier: Diagnosis of  By: Larwance Sachs of this note might be different from the original. Hypertension   Benign paroxysmal positional vertigo 09/14/2021   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[place]) 07/22/2021   Body mass index (BMI) 25.0-25.9, adult 11/02/2020   Cancer ([place])    skin cancer   Cardiac murmur 07/25/2021   Carotid stenosis    a. Carotid U/S 5/28: LICA < 41%, RICA 32-44%;  b.  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   Cystoid macular edema of both eyes 07/28/2020   Degenerative disc disease, lumbar 06/30/2015   Deviated nasal septum 02/18/2015   Diverticulosis 03/03/2020   DJD (degenerative joint disease) of hip 12/24/2012   Dyspnea 06/12/2011    Emphysema, unspecified ([place]) 03/05/2020   Erectile dysfunction 06/20/2017   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[place]) 02/09/2021   Gait disorder 03/02/2021   GERD (gastroesophageal reflux disease)    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   Qualifier: Diagnosis of  By: Burnett Kanaris     Greater trochanteric pain syndrome 07/22/2021   Hand pain, left 01/16/2022   Hearing loss 02/02/2016   Heme positive stool 02/29/2020   HIATAL HERNIA 05/13/2009   Qualifier: Diagnosis of  By: Burnett Kanaris     High risk medication use 01/18/2012   Hip pain 03/02/2021   History of chicken pox    History of COVID-19 12/05/2021   History of hemorrhoids    History of kidney stones    History of skin cancer 07/22/2021   skin cancer   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 Cardiology - [facility] Frances Nickels [facility] cardiology  ICD-10 cut over   Hypertension    under control   Hypokalemia 07/08/2021   Hypothyroidism    Internal hemorrhoids 03/03/2020   Leg cramps 11/13/2019   Lower GI bleed 12/12/2012   Lumbago of lumbar region with sciatica 10/21/2020   Lumbar radiculopathy 03/02/2021   Lumbar spondylosis 11/02/2020   NEPHROLITHIASIS 05/13/2009   Qualifier: Diagnosis of  By: Burnett Kanaris     Nonspecific abnormal electrocardiogram (ECG) (EKG) 01/06/2013   Numbness of hand 01/24/2022   Onychomycosis 06/30/2015   Osteoarthrosis, 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 Osteoarthritis Of The Hand  10/1 IMO update   Osteoarthrosis, unspecified whether generalized or localized, pelvic region and thigh 07/25/2010   Formatting of this note might be different from the original. Osteoarthritis Of The Hip   Other abnormal glucose 07/22/2021   Other allergic rhinitis  02/18/2015   Palpitations 07/25/2021   Peripheral neuropathy 06/19/2016   Preventative health care 11/24/2015   Right groin pain 11/29/2012   Right inguinal hernia 05/13/2009   Qualifier: Diagnosis of  By: Burnett Kanaris     RLS (restless legs syndrome) 02/18/2015   Rosacea    Rotator cuff tear 07/27/2013   Situational depression 03/02/2021   Skin lesion 01/16/2022   Sleep apnea with use of continuous positive airway pressure (CPAP) 07/15/2013   CPAP set to  7 cm water,  Residual AHi was 7.8  With no leak.  User time 6 hours.  479 days , not used only 12 days - highly compliant 06-23-13  .    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   Urinary retention 11/26/2020   Ventral hernia 07/08/2012   Vertigo 07/08/2021   Weight loss 03/03/2020    Past Surgical History:  Procedure Laterality Date   ABDOMINAL HERNIA REPAIR  07/15/12   Dr [name]   ANTERIOR CERVICAL DECOMP/DISCECTOMY FUSION N/A 07/09/2020   Procedure: ANTERIOR CERVICAL DECOMPRESSION AND FUSION CERVICAL THREE-FOUR.;  Surgeon: [name], MD;  Location: [place];  Service: Neurosurgery;  Laterality: N/A;  anterior   BROW LIFT  05/07/01   CYSTOSCOPY WITH URETEROSCOPY AND STENT PLACEMENT Right 01/28/2014   Procedure: CYSTOSCOPY WITH URETEROSCOPY, BASKET RETRIVAL AND  STENT PLACEMENT;  Surgeon: [name], MD;  Location: [facility];  Service: Urology;  Laterality: Right;   epidural injections     multiple procedures   ESOPHAGEAL DILATION  1993 and 1994   multiple times   EYE SURGERY Bilateral 03/25/10, 2012   cataract removal   HEMORRHOID SURGERY  2014   HIATAL HERNIA REPAIR  12/21/93   HOLMIUM LASER  APPLICATION Right 1/44/8185   Procedure: HOLMIUM LASER APPLICATION;  Surgeon: [name], MD;  Location: [facility];  Service: Urology;  Laterality: Right;   INGUINAL HERNIA REPAIR Right 07/15/12   Dr [name], x2   JOINT REPLACEMENT  07/25/04   right knee   JOINT REPLACEMENT  07/13/10   left knee   [place]  09/20/06   with intraoperative cholangiogram and right inguinal herniorrhaphy with mesh    LIGAMENT REPAIR Left 07/2013   shoulder   LITHOTRIPSY     x2   [place] FUSION/FORAMINOTOMY N/A 11/24/2020   Procedure: Posterior Cervical Laminectomy Cervical three-four, Cervical four-five with lateral mass fusion;  Surgeon: [name], MD;  Location: [place];  Service: Neurosurgery;  Laterality: N/A;   REFRACTIVE SURGERY Bilateral    Right total hip replacement  4/14   SKIN CANCER DESTRUCTION     nose, ear   TONSILLECTOMY  as child   TOTAL HIP ARTHROPLASTY Left    URETHRAL DILATION  1991   [name]    Family History  Problem Relation Age of Onset   Cancer Mother    Hypertension Other    Cancer Other    Osteoarthritis Other     Social History   Socioeconomic History   Marital status: Married    Spouse name: Not on file   Number of children: 1   Years of education: Not on file   Highest education level: Not on file  Occupational History   Occupation: firefighter    Comment: paints on the side  Tobacco Use   Smoking status: Former    Years: 11.00    Types: Cigarettes    Quit date: 09/11/1978    Years since quitting: 43.4   Smokeless tobacco: Never  Vaping Use   Vaping Use: Never used  Substance and Sexual Activity   Alcohol use: Not Currently    Alcohol/week: 0.0 standard drinks    Comment: rare   Drug use: No   Sexual activity: Not Currently  Other Topics Concern   Not on file  Social History Narrative   Lives with his wife   He has one son- lives locally.   2 grandchildren   Has  worked for Research officer, trade union   Enjoys wood working   Completed HS.  [employer]   Social Determinants of Health   Financial Resource Strain: Low Risk  Difficulty of Paying Living Expenses: Not very hard  Food Insecurity: No Food Insecurity   Worried About Running Out of Food in the Last Year: Never true   Ran Out of Food in the Last Year: Never true  Transportation Needs: No Transportation Needs   Lack of Transportation (Medical): No   Lack of Transportation (Non-Medical): No  Physical Activity: Insufficiently Active   Days of Exercise per Week: 2 days   Minutes of Exercise per Session: 30 min  Stress: No Stress Concern Present   Feeling of Stress : Only a little  Social Connections: Moderately Isolated   Frequency of Communication with Friends and Family: More than three times a week   Frequency of Social Gatherings with Friends and Family: More than three times a week   Attends Religious Services: Never   Marine scientist or Organizations: No   Attends Music therapist: Never   Marital Status: Married  Human resources officer Violence: Not At Risk   Fear of Current or Ex-Partner: No   Emotionally Abused: No   Physically Abused: No   Sexually Abused: No    Outpatient Medications Prior to Visit  Medication Sig Dispense Refill   acetaminophen (TYLENOL) 650 MG CR tablet Take 1,300 mg by mouth every morning. And take 650 mg at bedtime     albuterol (VENTOLIN HFA) 108 (90 Base) MCG/ACT inhaler INHALE 2 PUFFS INTO THE LUNGS EVERY 6 HOURS AS NEEDED FOR SHORTNESS OF BREATH. 1 each 3   Alcohol Swabs (ALCOHOL PADS) 70 % PADS 1 packet by Does not apply route 2 (two) times daily. 100 each 1   allopurinol (ZYLOPRIM) 100 MG tablet Take 100 mg by mouth 2 (two) times daily.     amLODipine (NORVASC) 5 MG tablet Take 1 tablet (5 mg total) by mouth daily. 90 tablet 3   ampicillin (PRINCIPEN) 500 MG capsule Take 1 capsule (500 mg total) by mouth 4 (four) times daily. 24 capsule 0    aspirin EC 81 MG tablet Take 81 mg by mouth every morning.     Cholecalciferol (VITAMIN D) 50 MCG (2000 UT) CAPS Take 2,000 Units by mouth daily.     colchicine 0.6 MG tablet Take 0.6 mg by mouth as needed for other. For gout     cycloSPORINE (RESTASIS) 0.05 % ophthalmic emulsion Place 1 drop into both eyes 2 (two) times daily.     diclofenac Sodium (VOLTAREN) 1 % GEL Apply 1 application topically daily as needed for pain (Hand pain).     fluocinonide (LIDEX) 0.05 % external solution Apply 1 application topically 2 (two) times daily as needed for itching or pain.     gabapentin (NEURONTIN) 300 MG capsule TAKE 1 CAPSULE TWO TO THREE TIMES DAILY AS NEEDED 180 capsule 1   GEMTESA 75 MG TABS Take 1 tablet by mouth daily.     glucose blood test strip TRUE Metrix Blood Glucose Test Strip, use as instructed 100 each 12   Homeopathic Products (THERAWORX RELIEF) FOAM Apply 1 application topically 2 (two) times daily as needed (Pain).     hydrALAZINE (APRESOLINE) 50 MG tablet Take 1 tablet (50 mg total) by mouth 2 (two) times daily. 60 tablet 2   Lancets MISC 1 each by Does not apply route 2 (two) times daily. TRUE matrix lancets 100 each 3   levothyroxine (SYNTHROID) 150 MCG tablet Take 150 mcg 6 days a week (patient takes 75 mcg once a week) Disp #84 for 90 day supply 84  tablet 3   levothyroxine (SYNTHROID) 75 MCG tablet Take 75 mcg once a week (patient on 150 mcg 6 days a week) Dispense #24 for 90 day supply. 24 tablet 3   losartan (COZAAR) 100 MG tablet TAKE 1 TABLET EVERY DAY 90 tablet 1   Magnesium 500 MG TABS Take 500 mg by mouth daily.     meclizine (ANTIVERT) 25 MG tablet Take 1 tablet (25 mg total) by mouth 3 (three) times daily as needed for dizziness. 20 tablet 0   multivitamin (THERAGRAN) per tablet Take 1 tablet by mouth daily.     nitroGLYCERIN (NITROSTAT) 0.4 MG SL tablet Place 1 tablet (0.4 mg total) under the tongue every 5 (five) minutes as needed for chest pain. 25 tablet 6    ondansetron (ZOFRAN-ODT) 4 MG disintegrating tablet Take 1 tablet (4 mg total) by mouth every 8 (eight) hours as needed for nausea or vomiting. 10 tablet 0   polyethylene glycol (MIRALAX / GLYCOLAX) 17 g packet Take 17-34 g by mouth daily as needed for constipation.     rosuvastatin (CRESTOR) 5 MG tablet TAKE 1 TABLET EVERY DAY 90 tablet 1   sodium chloride (OCEAN) 0.65 % nasal spray Place 1 spray into both nostrils at bedtime as needed for congestion.     traMADol (ULTRAM) 50 MG tablet Take 1 tablet (50 mg total) by mouth every 12 (twelve) hours as needed. 10 tablet 0   No facility-administered medications prior to visit.    Allergies  Allergen Reactions   Pravastatin Other (See Comments)    Muscle cramps   Sildenafil Other (See Comments)    Tachycardia     Oxycodone     hallucinations   Atenolol Other (See Comments)    bradycardia   Elastic Bandages & [Zinc] Other (See Comments)    Teflon bandages - Irritation   Metoclopramide Hcl Anxiety    Review of Systems  Gastrointestinal:  Positive for constipation. Negative for abdominal pain.       (+) flatulence (+) Belching  Musculoskeletal:  Positive for joint pain (Carpal Tunnel, Left Hand).       (+) Bilateral Knee Weakness      Objective:    Physical Exam Constitutional:      General: He is not in acute distress.    Appearance: Normal appearance. He is not ill-appearing.  HENT:     Head: Normocephalic and atraumatic.     Right Ear: External ear normal.     Left Ear: External ear normal.  Eyes:     Extraocular Movements: Extraocular movements intact.     Pupils: Pupils are equal, round, and reactive to light.  Cardiovascular:     Rate and Rhythm: Normal rate. Rhythm irregular.     Heart sounds: Normal heart sounds. No murmur heard.   No gallop.     Comments: Decreased Heart Sounds Pulmonary:     Effort: Pulmonary effort is normal. No respiratory distress.     Breath sounds: Normal breath sounds. No wheezing or  rales.  Abdominal:     General: Bowel sounds are normal.     Palpations: Abdomen is soft.     Tenderness: There is no abdominal tenderness.  Musculoskeletal:     Right lower leg: 2+ Edema present.     Left lower leg: 2+ Edema present.     Comments: 5/5 strength in both upper and lower extremities   Thenar wasting left hand   Skin:    General: Skin is warm and dry.  Comments: Left middle nail, hyperpigmentation  Neurological:     Mental Status: He is alert and oriented to person, place, and time.     Cranial Nerves: Cranial nerves 2-12 are intact.     Deep Tendon Reflexes:     Reflex Scores:      Patellar reflexes are 3+ on the right side and 2+ on the left side.    Comments: Unsteady gait  Psychiatric:        Mood and Affect: Mood normal.        Behavior: Behavior normal.        Judgment: Judgment normal.    BP (!) 155/66 (BP Location: Right Arm, Patient Position: Sitting, Cuff Size: Small)   Pulse 66   Temp (!) 97.5 F (36.4 C) (Oral)   Resp 16   Wt 149 lb (67.6 kg)   SpO2 99%   BMI 23.62 kg/m  Wt Readings from Last 3 Encounters:  02/03/22 149 lb (67.6 kg)  01/26/22 152 lb (68.9 kg)  01/16/22 149 lb (67.6 kg)       Assessment & Plan:   Problem List Items Addressed This Visit       Unprioritized   Skin lesion - Primary    New.  Has been present for a long time per patient. Very remote trauma. I would like dermatology to look at this to exclude melanoma.        Relevant Orders   Ambulatory referral to Dermatology   Irregular heart beat    Heart sounds were very soft and difficult to hear.  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 This is unchanged when compared to EKG on file from 2022. He continues to follow with cardiology.        Relevant Orders   EKG 12-Lead (Completed)   Gait instability    He felt that he was doing better when he  was doing PT. Would like to restart.  Order placed.        Relevant Orders   Ambulatory referral to Physical Therapy   Constipation    New. Recommended the following:  Add metamucil once daily (you can choose metamucil wafers, powder or capsules). Continue the miralax one teaspoon twice daily. Continue the probiotic daily.  Continue to stay well hydrated.  Let me know if your stomach concerns do not improve with above recommendations.        No orders of the defined types were placed in this encounter.   I, Nance Pear, NP, personally preformed the services described in this documentation.  All medical record entries made by the scribe were at my direction and in my presence.  I have reviewed the chart and discharge instructions (if applicable) and agree that the record reflects my personal performance and is accurate and complete. 02/03/2022   I,Amber Collins,acting as a scribe for Nance Pear, NP.,have documented all relevant documentation on the behalf of Nance Pear, NP,as directed by  Nance Pear, NP while in the presence of Nance Pear, NP.    Nance Pear, NP

## 2022-02-03 NOTE — Patient Instructions (Addendum)
Add metamucil once daily (you can choose metamucil wafers, powder or capsules). Continue the miralax one teaspoon twice daily. Continue the probiotic daily.  Continue to stay well hydrated.  You should be contacted about scheduling your appointment with physical therapy and the dermatologist.  Let me know if your stomach concerns do not improve with above recommendations.

## 2022-02-03 NOTE — Assessment & Plan Note (Signed)
New. Recommended the following:  Add metamucil once daily (you can choose metamucil wafers, powder or capsules). Continue the miralax one teaspoon twice daily. Continue the probiotic daily.  Continue to stay well hydrated.  Let me know if your stomach concerns do not improve with above recommendations.

## 2022-02-03 NOTE — Assessment & Plan Note (Signed)
He felt that he was doing better when he was doing PT. Would like to restart.  Order placed.

## 2022-02-03 NOTE — Assessment & Plan Note (Signed)
Heart sounds were very soft and difficult to hear.  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 This is unchanged when compared to EKG on file from 2022. He continues to follow with cardiology.

## 2022-02-09 ENCOUNTER — Telehealth: Payer: Self-pay

## 2022-02-09 NOTE — Telephone Encounter (Signed)
Pt called- he couldn't remember if Melissa discussed EKG w/ him from 02/03/22- OV note reviewed- informed Pt no new concerns noted. Pt verbalized understanding.

## 2022-02-21 NOTE — Therapy (Signed)
OUTPATIENT PHYSICAL THERAPY NEURO EVALUATION   Patient Name: [name] Pratt MRN: [id number] DOB:May 22, 1941, 81 y.o., male Today's Date: 02/22/2022      PT End of Session - 02/22/22 1019     Visit Number 1    Number of Visits 17    Date for PT Re-Evaluation 04/19/22    Authorization Type [name]    PT Start Time 1019    PT Stop Time 1122    PT Time Calculation (min) 63 min             Past Medical History:  Diagnosis Date   Allergic rhinitis 02/02/2016   Anemia 12/17/2009   Formatting of this note might be different from the original. Anemia  10/1 IMO update   Aortic atherosclerosis ([place]) 03/05/2020   Arthritis    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   Atypical chest pain 11/24/2015   Benign essential hypertension 05/13/2009   Qualifier: Diagnosis of  By: Larwance Sachs of this note might be different from the original. Hypertension   Benign paroxysmal positional vertigo 09/14/2021   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[place]) 07/22/2021   Body mass index (BMI) 25.0-25.9, adult 11/02/2020   Cancer ([place])    skin cancer   Cardiac murmur 07/25/2021   Carotid stenosis    a. Carotid U/S 5/27: LICA < 78%, RICA 24-23%;  b.  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   Cystoid macular edema of both eyes 07/28/2020   Degenerative disc disease, lumbar 06/30/2015   Deviated nasal septum 02/18/2015   Diverticulosis 03/03/2020   DJD (degenerative joint disease) of hip 12/24/2012   Dyspnea 06/12/2011   Emphysema, unspecified ([place]) 03/05/2020   Erectile dysfunction 06/20/2017   Exudative age-related  macular degeneration of right eye with active choroidal neovascularization ([place]) 02/09/2021   Gait disorder 03/02/2021   GERD (gastroesophageal reflux disease)    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   Qualifier: Diagnosis of  By: Burnett Kanaris     Greater trochanteric pain syndrome 07/22/2021   Hand pain, left 01/16/2022   Hearing loss 02/02/2016   Heme positive stool 02/29/2020   HIATAL HERNIA 05/13/2009   Qualifier: Diagnosis of  By: Burnett Kanaris     High risk medication use 01/18/2012   Hip pain 03/02/2021   History of chicken pox    History of COVID-19 12/05/2021   History of hemorrhoids    History of kidney stones    History of skin cancer 07/22/2021   skin cancer   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 Cardiology - Dr Frances Nickels at [facility] cardiology  ICD-10 cut over   Hypertension    under control   Hypokalemia 07/08/2021   Hypothyroidism    Internal hemorrhoids 03/03/2020   Leg cramps 11/13/2019   Lower GI bleed 12/12/2012   Lumbago of lumbar region with sciatica 10/21/2020   Lumbar radiculopathy 03/02/2021   Lumbar spondylosis 11/02/2020   NEPHROLITHIASIS 05/13/2009   Qualifier: Diagnosis of  By: Burnett Kanaris  Nonspecific abnormal electrocardiogram (ECG) (EKG) 01/06/2013   Numbness of hand 01/24/2022   Onychomycosis 06/30/2015   Osteoarthrosis, 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 Osteoarthritis Of The Hand  10/1 IMO update   Osteoarthrosis, unspecified whether generalized or localized, pelvic region and thigh 07/25/2010   Formatting of this note might be different from the original. Osteoarthritis Of The Hip   Other abnormal glucose 07/22/2021   Other allergic rhinitis 02/18/2015   Palpitations 07/25/2021   Peripheral neuropathy 06/19/2016   Preventative health care  11/24/2015   Right groin pain 11/29/2012   Right inguinal hernia 05/13/2009   Qualifier: Diagnosis of  By: Burnett Kanaris     RLS (restless legs syndrome) 02/18/2015   Rosacea    Rotator cuff tear 07/27/2013   Situational depression 03/02/2021   Skin lesion 01/16/2022   Sleep apnea with use of continuous positive airway pressure (CPAP) 07/15/2013   CPAP set to  7 cm water,  Residual AHi was 7.8  With no leak.  User time 6 hours.  479 days , not used only 12 days - highly compliant 06-23-13  .    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   Urinary retention 11/26/2020   Ventral hernia 07/08/2012   Vertigo 07/08/2021   Weight loss 03/03/2020   Past Surgical History:  Procedure Laterality Date   ABDOMINAL HERNIA REPAIR  07/15/12   Dr [name]   ANTERIOR CERVICAL DECOMP/DISCECTOMY FUSION N/A 07/09/2020   Procedure: ANTERIOR CERVICAL DECOMPRESSION AND FUSION CERVICAL THREE-FOUR.;  Surgeon: [name], MD;  Location: [place];  Service: Neurosurgery;  Laterality: N/A;  anterior   BROW LIFT  05/07/01   CYSTOSCOPY WITH URETEROSCOPY AND STENT PLACEMENT Right 01/28/2014   Procedure: CYSTOSCOPY WITH URETEROSCOPY, BASKET RETRIVAL AND  STENT PLACEMENT;  Surgeon: [name], MD;  Location: [facility];  Service: Urology;  Laterality: Right;   epidural injections     multiple procedures   ESOPHAGEAL DILATION  1993 and 1994   multiple times   EYE SURGERY Bilateral 03/25/10, 2012   cataract removal   HEMORRHOID SURGERY  2014   HIATAL HERNIA REPAIR  12/21/93   HOLMIUM LASER APPLICATION Right 9/76/7341   Procedure: HOLMIUM LASER APPLICATION;  Surgeon: [name], MD;  Location:  [facility];  Service: Urology;  Laterality: Right;   INGUINAL HERNIA REPAIR Right 07/15/12   Dr [name], x2   JOINT REPLACEMENT  07/25/04   right knee   JOINT REPLACEMENT  07/13/10   left knee   [place]  09/20/06   with intraoperative cholangiogram and right inguinal herniorrhaphy with mesh    LIGAMENT REPAIR Left 07/2013   shoulder   LITHOTRIPSY     x2   [place] FUSION/FORAMINOTOMY N/A 11/24/2020   Procedure: Posterior Cervical Laminectomy Cervical three-four, Cervical four-five with lateral mass fusion;  Surgeon: [name], MD;  Location: [place];  Service: Neurosurgery;  Laterality: N/A;   REFRACTIVE SURGERY Bilateral    Right total hip replacement  4/14   SKIN CANCER DESTRUCTION     nose, ear   TONSILLECTOMY  as child   TOTAL HIP ARTHROPLASTY Left    URETHRAL DILATION  1991   [name]   Patient Active Problem List   Diagnosis Date Noted   Irregular heart beat 02/03/2022   Constipation 02/03/2022   Carpal tunnel syndrome 01/24/2022   Numbness of hand 01/24/2022   Ulnar neuropathy 01/24/2022   Skin lesion 01/16/2022   Hand pain, left 01/16/2022   History of COVID-19 12/05/2021   Benign paroxysmal positional vertigo 09/14/2021   Palpitations 07/25/2021   Cardiac murmur 07/25/2021   History of chicken pox 07/22/2021   History of hemorrhoids 07/22/2021   Hypertension 07/22/2021   COPD (chronic obstructive pulmonary disease) ([place]) 07/22/2021   History of kidney stones 07/22/2021   History of skin cancer 03/06/9484   Complication of anesthesia 07/22/2021   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[place]) 07/22/2021   Other abnormal glucose 07/22/2021   Spondylitis ([place]) 07/22/2021   Greater trochanteric pain syndrome 07/22/2021   Vertigo 07/08/2021   Hypokalemia 07/08/2021   Trochanteric bursitis of left hip 06/30/2021   Cancer ([place]) 04/11/2021   Lumbar radiculopathy 03/02/2021   Gait instability 03/02/2021    Hip pain 03/02/2021   Situational depression 03/02/2021   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[place]) 02/09/2021   Urinary retention 11/26/2020   Cervical myelopathy ([place]) 11/24/2020   Body mass index (BMI) 25.0-25.9, adult 11/02/2020   Lumbar spondylosis 11/02/2020   Lumbago of lumbar region with sciatica 10/21/2020   Cystoid macular edema of both eyes 07/28/2020   Status post cervical spinal fusion 07/09/2020   History of total bilateral knee replacement 07/08/2020   Aortic atherosclerosis ([place]) 03/05/2020   Emphysema, unspecified ([place]) 03/05/2020   Arthritis 03/04/2020   Diverticulosis 03/03/2020   Internal hemorrhoids 03/03/2020   Weight loss 03/03/2020   Heme positive stool 02/29/2020   Leg cramps 11/13/2019   Tibialis anterior tendon tear, nontraumatic 11/13/2019   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   CPAP (continuous positive airway pressure) dependence 03/25/2018   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   Erectile dysfunction 06/20/2017   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 03/06/2017   Peripheral neuropathy 06/19/2016   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   Hearing loss 02/02/2016   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   Allergic rhinitis 02/02/2016   Atypical chest pain 11/24/2015   Preventative health care 11/24/2015   Onychomycosis 06/30/2015   Degenerative disc disease, lumbar 06/30/2015   Other allergic rhinitis 02/18/2015   Deviated nasal septum 02/18/2015   RLS (restless legs syndrome) 02/18/2015   GERD (gastroesophageal reflux disease) 09/18/2014   Hyperglycemia 03/03/2014   Rotator cuff tear 07/27/2013   Sleep apnea with use of continuous positive airway pressure (CPAP) 07/15/2013   Carotid stenosis 02/24/2013   Nonspecific abnormal electrocardiogram (ECG) (EKG) 01/06/2013   DJD (degenerative joint disease) of hip 12/24/2012   Lower GI  bleed 12/12/2012   Right groin pain 11/29/2012   Ventral hernia 07/08/2012   High risk medication use 01/18/2012   Dyspnea 06/12/2011   Hyperlipidemia with target LDL less than 130 12/27/2010   Osteoarthrosis, unspecified whether generalized or localized, pelvic region and thigh 07/25/2010   Osteoarthrosis, hand 06/13/2010   Anemia 12/17/2009   Hypothyroidism 05/13/2009   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   Benign essential hypertension 05/13/2009   Right inguinal hernia 05/13/2009   HIATAL HERNIA 05/13/2009   NEPHROLITHIASIS 05/13/2009   ROSACEA 05/13/2009    PCP: [name], NP  REFERRING PROVIDER: [name], NP  REFERRING DIAG: R26.81 (ICD-10-CM) - Gait instability   THERAPY DIAG:  Unsteadiness on feet  Other abnormalities of gait and mobility  Muscle weakness (generalized)  Repeated falls  Other low back pain  RATIONALE FOR EVALUATION AND TREATMENT:  Rehabilitation  ONSET DATE:  Fall 01/12/22 (2 falls in past 6 months)  SUBJECTIVE:   SUBJECTIVE STATEMENT: Pt reports he had carpal tunnel surgery in early May. He was taking pain killers as prescribed but went to get up but lost his balance and fell. No acute injuries noted from fall. Recently completed a PT episode in April for vertigo and gait instability - vertigo seems to be resolved and was not re-triggered with the fall but still feels unsteady on his feet due to weakness and tightness especially in his hips.   Pt accompanied by: self  PAIN:  Are you having pain? Yes: NPRS scale:  6/10 Pain location: B low back and R>L posterior hip/buttocks Pain description: aching Aggravating factors: prolonged sitting or standing Relieving factors: walking around  Are you having pain? Yes: NPRS scale: intermittently 9/10 Pain location: R>L posterior calves and feet Pain description: throbbing, cramping Aggravating factors: stretching legs out while laying Relieving factors: stretching  PERTINENT HISTORY:  Aortic  atherosclerosis; carotid stenosis; cervical myelopathy s/p ACDF 06/2020 and posterior cervical fusion 11/2020; peripheral neuropathy; lumbar DDD/spondylosis; lumbago with sciatica/lumbar radiculopathy; OA; B TKA; BTHA; L greater trochanteric pain syndrome; L RCR 2014; recent CTR surgery - early May; hearing loss; HTN; BPPV/vertigo; B sacroiliitis; skin cancer; COPD/emphysema; sleep apnea with CPAP; GERD; gout; HLD; LE cramps; RLS  PRECAUTIONS: None  WEIGHT BEARING RESTRICTIONS: No  FALLS: Has patient fallen in last 6 months? Yes. 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 No acute intracranial abnormality.  2. No acute fracture or traumatic subluxation of the cervical spine. L wrist x-ray: 1. Mild dorsal wrist soft tissue swelling with small ossific density overlying the proximal carpal row. Correlate for triquetral fracture.  2. Moderate scaphotrapeziotrapezoid and mild first CMC joint osteoarthritis. L elbow x-ray: 1. No acute fracture or dislocation, left elbow.  2. Mild elevation of the anterior fat pad suggestive of a small joint effusion, which may be reactive in the setting of elbow osteoarthritis. L shoulder x-rays: No acute osseous abnormality.  COGNITION: Overall  cognitive status: Within functional limits for tasks assessed and Impaired: Memory: Impaired: Short term Procedural   SENSATION: Peripheral neuropathy in B feet  COORDINATION: Mildly impaired with slow movements  EDEMA:  Mild to moderate edema in distal B LE  MUSCLE LENGTH: Hamstrings: mod tight R>L ITB: mod tight B Piriformis: mod tight L>R Hip flexors: mod tight B Quads: mod tight B Heelcord: mod tight B  POSTURE:  rounded shoulders, forward head, and flexed trunk  LOWER EXTREMITY MMT:   Tested in sitting on eval MMT Right Eval Left Eval  Hip flexion 3+ 4-  Hip extension 3- 3-  Hip abduction 4- 4  Hip adduction 4 4  Hip internal rotation 3 3+  Hip external rotation 3+ 3+  Knee flexion 4- 4-  Knee extension 4- 4  Ankle dorsiflexion 4- 4  Ankle plantarflexion 3+  6 SLS HR 4 10 SLS HR  Ankle inversion 3+ 4-  Ankle eversion 3+ 4-  (Blank rows = not tested)  BED MOBILITY:  Sit to supine SBA Supine to sit SBA  TRANSFERS: Assistive device utilized: Single point cane and None  Sit to stand: Modified independence Stand to sit: Modified independence Chair to chair: Modified independence Floor:  NT  GAIT: Gait pattern: step through pattern, decreased stride length, decreased hip/knee flexion- Right, decreased hip/knee flexion- Left, decreased trunk rotation, and wide BOS Distance walked: 80 Assistive device utilized: Single point cane Level of assistance: SBA Comments:   RAMP: Level of Assistance:  NT Assistive device utilized:    Ramp Comments:   CURB:  Level of Assistance:  NT Assistive device utilized:    Curb Comments:   STAIRS:  Level of Assistance:  NT  Stair Negotiation Technique:   Number of Stairs:    Height of Stairs:   Comments:   FUNCTIONAL TESTS:  5 times sit to stand: 13.0 sec Timed up and go (TUG): 13.53 sec with SPC; 14.19 sec w/o AD 10 meter walk test: To be assessed next visit Berg Balance Scale: 39/56 - scores 37-45  indicate significant risk for falls (>80%) Dynamic Gait Index: DGI vs FGA - To be assessed next visit Functional gait assessment: DGI vs FGA - To be assessed next visit  PATIENT SURVEYS:  ABC scale 55% of self confidence; 45% impairment  TODAY'S TREATMENT:   02/22/22 Eval only   PATIENT EDUCATION: Education details: PT eval findings, anticipated POC, and need for further assessment of dynamic gait/balance Person educated: Patient Education method: Explanation Education comprehension: verbalized understanding   HOME EXERCISE PROGRAM: TBD - [id number] (from previous episode)   ASSESSMENT:  CLINICAL IMPRESSION: [name] Pratt is a 81 y.o. male who was seen today for physical therapy evaluation and treatment for gait instability. He completed a PT episode earlier this year for vertigo and gait/balance with resolution of the vertigo but some ongoing instability reported which he attributes to issues with his legs, hips and back. He experienced a significant fall on 01/12/22 which he attributes to the pain medicine he was taking following L carpal tunnel surgery, and reports one other fall in the past 6 months. He denies return of the vertigo but feels that his balance and gait stability have decline since stopping PT. Standardized balance testing reveals a decline from his PLOF on the 5xSTS and much more significantly on the Berg where his score dropped from 53/56 to 39/56. Unsteadiness with gait also evident but time did not allow for formal dynamic gait assessment today, hence will plan for FGA vs DGI next visit. Other deficits include pain in low back and B LEs, abnormal posture, decreased LE flexibility with increased muscle tension and cramping, and B LE weakness R>L. Marv "[name]" would benefit from skilled physical therapy to address above deficits to improve strength and balance for better stability and to reduce risk for falls.  OBJECTIVE IMPAIRMENTS Abnormal gait, decreased  activity tolerance, decreased balance, decreased endurance, decreased knowledge of condition, decreased knowledge of use of DME, decreased  mobility, difficulty walking, decreased ROM, decreased strength, decreased safety awareness, increased fascial restrictions, impaired perceived functional ability, increased muscle spasms, impaired flexibility, improper body mechanics, postural dysfunction, and pain.   ACTIVITY LIMITATIONS carrying, lifting, bending, standing, squatting, stairs, transfers, and locomotion level  PARTICIPATION LIMITATIONS: cleaning, laundry, interpersonal relationship, community activity, and yard work  PERSONAL FACTORS Age, Fitness, Past/current experiences, Time since onset of injury/illness/exacerbation, and 3+ comorbidities: Aortic atherosclerosis; carotid stenosis; cervical myelopathy s/p ACDF 06/2020 and posterior cervical fusion 11/2020; peripheral neuropathy; lumbar DDD/spondylosis; lumbago with sciatica/lumbar radiculopathy; OA; B TKA; BTHA; L greater trochanteric pain syndrome; L RCR 2014; recent CTR surgery - early May; hearing loss; HTN; BPPV/vertigo; B sacroiliitis; skin cancer; COPD/emphysema; sleep apnea with CPAP; GERD; gout; HLD; LE cramps; RLS  are also affecting patient's functional outcome.   REHAB POTENTIAL: Good  CLINICAL DECISION MAKING: Evolving/moderate complexity  EVALUATION COMPLEXITY: Moderate  GOALS: Goals reviewed with patient? Yes  SHORT TERM GOALS: Target date: 03/22/2022   Patient will be independent with initial HEP. Baseline:  Goal status: INITIAL  2.  Patient will demonstrate decreased fall risk by scoring > 45/56 on Berg. Baseline: 39/56 Goal status: INITIAL  3.  Patient will be educated on strategies to decrease risk of falls.  Baseline:  Goal status: INITIAL   LONG TERM GOALS: Target date: 04/19/2022   Patient will be independent with advanced/ongoing HEP to improve outcomes and carryover.  Baseline:  Goal status: INITIAL  2.   Patient will be able to ambulate 600' with LRAD with good safety to access community.  Baseline:  Goal status: INITIAL  3.  Patient will be able to step up/down curb safely with LRAD for safety with community ambulation.  Baseline:  Goal status: INITIAL   4.  Patient will demonstrate improved functional LE strength as demonstrated by overall MMT >/= 4 to 4+/5. Baseline:  Goal status: INITIAL  5.  Patient will demonstrate at least 19/24 on DGI to improve gait stability and reduce risk for falls. Baseline: TBD next visit Goal status: INITIAL  6.  Patient will score >/= 52/56 on Berg Balance test to demonstrate lower risk of falls. (MCID= 8 points) .  Baseline: 39/56 Goal status: INITIAL  7.  Patient will report >67% of self confidence on ABC scale to demonstrate improved functional ability. Baseline: 55% self confidence; 45 % impairment Goal status: INITIAL  8.  Patient will demonstrate gait speed of >/= 1.8 ft/sec (0.55 m/s) to be a safe limited community ambulator with decreased risk for recurrent falls.  Baseline: TBD next visit Goal status: INITIAL   PLAN: PT FREQUENCY: 2x/week  PT DURATION: 8 weeks  PLANNED INTERVENTIONS: Therapeutic exercises, Therapeutic activity, Neuromuscular re-education, Balance training, Gait training, Patient/Family education, Joint mobilization, Stair training, Vestibular training, Canalith repositioning, Dry Needling, Electrical stimulation, Spinal manipulation, Cryotherapy, Moist heat, Taping, Ultrasound, Ionotophoresis '4mg'$ /ml Dexamethasone, Manual therapy, and Re-evaluation  PLAN FOR NEXT SESSION: Complete balance assessment - 10 MWT, DGI vs FGA; review/update HEP from previous episode vs create new initial HEP   Percival Spanish, PT 02/22/2022, 1:28 PM

## 2022-02-22 ENCOUNTER — Ambulatory Visit: Payer: Medicare HMO | Attending: Family | Admitting: Physical Therapy

## 2022-02-22 ENCOUNTER — Encounter: Payer: Self-pay | Admitting: Physical Therapy

## 2022-02-22 ENCOUNTER — Other Ambulatory Visit: Payer: Self-pay

## 2022-02-22 DIAGNOSIS — R2681 Unsteadiness on feet: Secondary | ICD-10-CM | POA: Diagnosis not present

## 2022-02-22 DIAGNOSIS — M5459 Other low back pain: Secondary | ICD-10-CM | POA: Diagnosis not present

## 2022-02-22 DIAGNOSIS — M6281 Muscle weakness (generalized): Secondary | ICD-10-CM | POA: Diagnosis not present

## 2022-02-22 DIAGNOSIS — R296 Repeated falls: Secondary | ICD-10-CM | POA: Diagnosis not present

## 2022-02-22 DIAGNOSIS — R2689 Other abnormalities of gait and mobility: Secondary | ICD-10-CM

## 2022-02-24 ENCOUNTER — Encounter: Payer: Self-pay | Admitting: Physical Therapy

## 2022-02-24 ENCOUNTER — Ambulatory Visit: Payer: Medicare HMO | Attending: Family | Admitting: Physical Therapy

## 2022-02-24 DIAGNOSIS — M5459 Other low back pain: Secondary | ICD-10-CM

## 2022-02-24 DIAGNOSIS — M6281 Muscle weakness (generalized): Secondary | ICD-10-CM

## 2022-02-24 DIAGNOSIS — R296 Repeated falls: Secondary | ICD-10-CM

## 2022-02-24 DIAGNOSIS — R2681 Unsteadiness on feet: Secondary | ICD-10-CM

## 2022-02-24 DIAGNOSIS — R2689 Other abnormalities of gait and mobility: Secondary | ICD-10-CM | POA: Diagnosis not present

## 2022-02-24 NOTE — Therapy (Signed)
OUTPATIENT PHYSICAL THERAPY TREATMENT   Patient Name: [name] Pratt MRN: [id number] DOB:04-Aug-1941, 81 y.o., male Today's Date: 02/24/2022      PT End of Session - 02/24/22 1101     Visit Number 2    Number of Visits 17    Date for PT Re-Evaluation 04/19/22    Authorization Type [name]    PT Start Time 1101    PT Stop Time 1155    PT Time Calculation (min) 54 min    Activity Tolerance Patient tolerated treatment well    Behavior During Therapy [facility] for tasks assessed/performed              Past Medical History:  Diagnosis Date   Allergic rhinitis 02/02/2016   Anemia 12/17/2009   Formatting of this note might be different from the original. Anemia  10/1 IMO update   Aortic atherosclerosis ([place]) 03/05/2020   Arthritis    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   Atypical chest pain 11/24/2015   Benign essential hypertension 05/13/2009   Qualifier: Diagnosis of  By: Larwance Sachs of this note might be different from the original. Hypertension   Benign paroxysmal positional vertigo 09/14/2021   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[place]) 07/22/2021   Body mass index (BMI) 25.0-25.9, adult 11/02/2020   Cancer ([place])    skin cancer   Cardiac murmur 07/25/2021   Carotid stenosis    a. Carotid U/S 8/46: LICA < 65%, RICA 99-35%;  b.  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   Cystoid macular edema of both eyes 07/28/2020   Degenerative disc disease, lumbar 06/30/2015   Deviated nasal septum 02/18/2015   Diverticulosis 03/03/2020   DJD (degenerative joint disease) of hip 12/24/2012   Dyspnea  06/12/2011   Emphysema, unspecified ([place]) 03/05/2020   Erectile dysfunction 06/20/2017   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[place]) 02/09/2021   Gait disorder 03/02/2021   GERD (gastroesophageal reflux disease)    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   Qualifier: Diagnosis of  By: Burnett Kanaris     Greater trochanteric pain syndrome 07/22/2021   Hand pain, left 01/16/2022   Hearing loss 02/02/2016   Heme positive stool 02/29/2020   HIATAL HERNIA 05/13/2009   Qualifier: Diagnosis of  By: Burnett Kanaris     High risk medication use 01/18/2012   Hip pain 03/02/2021   History of chicken pox    History of COVID-19 12/05/2021   History of hemorrhoids    History of kidney stones    History of skin cancer 07/22/2021   skin cancer   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 Cardiology - Dr Frances Nickels at [facility] cardiology  ICD-10 cut over   Hypertension    under control   Hypokalemia 07/08/2021   Hypothyroidism    Internal hemorrhoids 03/03/2020   Leg cramps 11/13/2019   Lower GI bleed 12/12/2012   Lumbago of lumbar region with sciatica 10/21/2020   Lumbar radiculopathy 03/02/2021  Lumbar spondylosis 11/02/2020   NEPHROLITHIASIS 05/13/2009   Qualifier: Diagnosis of  By: Burnett Kanaris     Nonspecific abnormal electrocardiogram (ECG) (EKG) 01/06/2013   Numbness of hand 01/24/2022   Onychomycosis 06/30/2015   Osteoarthrosis, 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 Osteoarthritis Of The Hand  10/1 IMO update   Osteoarthrosis, unspecified whether generalized or localized, pelvic region and thigh 07/25/2010   Formatting of this note might be different from the original. Osteoarthritis Of The Hip   Other abnormal glucose 07/22/2021   Other  allergic rhinitis 02/18/2015   Palpitations 07/25/2021   Peripheral neuropathy 06/19/2016   Preventative health care 11/24/2015   Right groin pain 11/29/2012   Right inguinal hernia 05/13/2009   Qualifier: Diagnosis of  By: Burnett Kanaris     RLS (restless legs syndrome) 02/18/2015   Rosacea    Rotator cuff tear 07/27/2013   Situational depression 03/02/2021   Skin lesion 01/16/2022   Sleep apnea with use of continuous positive airway pressure (CPAP) 07/15/2013   CPAP set to  7 cm water,  Residual AHi was 7.8  With no leak.  User time 6 hours.  479 days , not used only 12 days - highly compliant 06-23-13  .    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   Urinary retention 11/26/2020   Ventral hernia 07/08/2012   Vertigo 07/08/2021   Weight loss 03/03/2020   Past Surgical History:  Procedure Laterality Date   ABDOMINAL HERNIA REPAIR  07/15/12   Dr [name]   ANTERIOR CERVICAL DECOMP/DISCECTOMY FUSION N/A 07/09/2020   Procedure: ANTERIOR CERVICAL DECOMPRESSION AND FUSION CERVICAL THREE-FOUR.;  Surgeon: [name], MD;  Location: [place];  Service: Neurosurgery;  Laterality: N/A;  anterior   BROW LIFT  05/07/01   CYSTOSCOPY WITH URETEROSCOPY AND STENT PLACEMENT Right 01/28/2014   Procedure: CYSTOSCOPY WITH URETEROSCOPY, BASKET RETRIVAL AND  STENT PLACEMENT;  Surgeon: [name], MD;  Location: [facility];  Service: Urology;  Laterality: Right;   epidural injections     multiple procedures   ESOPHAGEAL DILATION  1993 and 1994   multiple times   EYE SURGERY Bilateral 03/25/10, 2012   cataract removal   HEMORRHOID SURGERY  2014   HIATAL HERNIA REPAIR  12/21/93    HOLMIUM LASER APPLICATION Right 3/53/6144   Procedure: HOLMIUM LASER APPLICATION;  Surgeon: [name], MD;  Location: [facility];  Service: Urology;  Laterality: Right;   INGUINAL HERNIA REPAIR Right 07/15/12   Dr [name], x2   JOINT REPLACEMENT  07/25/04   right knee   JOINT REPLACEMENT  07/13/10   left knee   [place]  09/20/06   with intraoperative cholangiogram and right inguinal herniorrhaphy with mesh    LIGAMENT REPAIR Left 07/2013   shoulder   LITHOTRIPSY     x2   [place] FUSION/FORAMINOTOMY N/A 11/24/2020   Procedure: Posterior Cervical Laminectomy Cervical three-four, Cervical four-five with lateral mass fusion;  Surgeon: [name], MD;  Location: [place];  Service: Neurosurgery;  Laterality: N/A;   REFRACTIVE SURGERY Bilateral  Right total hip replacement  4/14   SKIN CANCER DESTRUCTION     nose, ear   TONSILLECTOMY  as child   TOTAL HIP ARTHROPLASTY Left    URETHRAL DILATION  1991   [name]   Patient Active Problem List   Diagnosis Date Noted   Irregular heart beat 02/03/2022   Constipation 02/03/2022   Carpal tunnel syndrome 01/24/2022   Numbness of hand 01/24/2022   Ulnar neuropathy 01/24/2022   Skin lesion 01/16/2022   Hand pain, left 01/16/2022   History of COVID-19 12/05/2021   Benign paroxysmal positional vertigo 09/14/2021   Palpitations 07/25/2021   Cardiac murmur 07/25/2021   History of chicken pox 07/22/2021   History of hemorrhoids 07/22/2021   Hypertension 07/22/2021   COPD (chronic obstructive pulmonary disease) ([place]) 07/22/2021   History of kidney stones 07/22/2021   History of skin cancer 33/29/5188   Complication of anesthesia 07/22/2021   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[place]) 07/22/2021   Other abnormal glucose 07/22/2021   Spondylitis ([place]) 07/22/2021   Greater trochanteric pain syndrome 07/22/2021   Vertigo 07/08/2021   Hypokalemia 07/08/2021   Trochanteric  bursitis of left hip 06/30/2021   Cancer ([place]) 04/11/2021   Lumbar radiculopathy 03/02/2021   Gait instability 03/02/2021   Hip pain 03/02/2021   Situational depression 03/02/2021   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[place]) 02/09/2021   Urinary retention 11/26/2020   Cervical myelopathy ([place]) 11/24/2020   Body mass index (BMI) 25.0-25.9, adult 11/02/2020   Lumbar spondylosis 11/02/2020   Lumbago of lumbar region with sciatica 10/21/2020   Cystoid macular edema of both eyes 07/28/2020   Status post cervical spinal fusion 07/09/2020   History of total bilateral knee replacement 07/08/2020   Aortic atherosclerosis ([place]) 03/05/2020   Emphysema, unspecified ([place]) 03/05/2020   Arthritis 03/04/2020   Diverticulosis 03/03/2020   Internal hemorrhoids 03/03/2020   Weight loss 03/03/2020   Heme positive stool 02/29/2020   Leg cramps 11/13/2019   Tibialis anterior tendon tear, nontraumatic 11/13/2019   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   CPAP (continuous positive airway pressure) dependence 03/25/2018   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   Erectile dysfunction 06/20/2017   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 03/06/2017   Peripheral neuropathy 06/19/2016   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   Hearing loss 02/02/2016   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   Allergic rhinitis 02/02/2016   Atypical chest pain 11/24/2015   Preventative health care 11/24/2015   Onychomycosis 06/30/2015   Degenerative disc disease, lumbar 06/30/2015   Other allergic rhinitis 02/18/2015   Deviated nasal septum 02/18/2015   RLS (restless legs syndrome) 02/18/2015   GERD (gastroesophageal reflux disease) 09/18/2014   Hyperglycemia 03/03/2014   Rotator cuff tear 07/27/2013   Sleep apnea with use of continuous positive airway pressure (CPAP) 07/15/2013   Carotid stenosis 02/24/2013    Nonspecific abnormal electrocardiogram (ECG) (EKG) 01/06/2013   DJD (degenerative joint disease) of hip 12/24/2012   Lower GI bleed 12/12/2012   Right groin pain 11/29/2012   Ventral hernia 07/08/2012   High risk medication use 01/18/2012   Dyspnea 06/12/2011   Hyperlipidemia with target LDL less than 130 12/27/2010   Osteoarthrosis, unspecified whether generalized or localized, pelvic region and thigh 07/25/2010   Osteoarthrosis, hand 06/13/2010   Anemia 12/17/2009   Hypothyroidism 05/13/2009   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   Benign essential hypertension 05/13/2009   Right inguinal hernia 05/13/2009   HIATAL HERNIA 05/13/2009  NEPHROLITHIASIS 05/13/2009   ROSACEA 05/13/2009    PCP: [name], NP  REFERRING PROVIDER: [name], NP  REFERRING DIAG: R26.81 (ICD-10-CM) - Gait instability   THERAPY DIAG:  Unsteadiness on feet  Other abnormalities of gait and mobility  Muscle weakness (generalized)  Repeated falls  Other low back pain  RATIONALE FOR EVALUATION AND TREATMENT:  Rehabilitation  ONSET DATE:  Fall 01/12/22 (2 falls in past 6 months)  SUBJECTIVE:   SUBJECTIVE STATEMENT: Pt reports he feels weaker and his legs feel more "nervy" today. Notes he had some calf pain upon waking this morning but better now although legs still feel weak.  Pt accompanied by: self  PAIN:  Are you having pain? Yes: NPRS scale:  7/10 Pain location: B low back and R>L posterior hip/buttocks Pain description: aching Aggravating factors: prolonged sitting or standing Relieving factors: walking around  PERTINENT HISTORY:  Aortic atherosclerosis; carotid stenosis; cervical myelopathy s/p ACDF 06/2020 and posterior cervical fusion 11/2020; peripheral neuropathy; lumbar DDD/spondylosis; lumbago with sciatica/lumbar radiculopathy; OA; B TKA; BTHA; L greater trochanteric pain syndrome; L RCR 2014; recent CTR surgery - early May; hearing loss; HTN; BPPV/vertigo; B sacroiliitis; skin  cancer; COPD/emphysema; sleep apnea with CPAP; GERD; gout; HLD; LE cramps; RLS  PRECAUTIONS: None  WEIGHT BEARING RESTRICTIONS: No  FALLS: Has patient fallen in last 6 months? Yes. 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 No acute intracranial abnormality.  2. No acute fracture or traumatic subluxation of the cervical spine. L wrist x-ray: 1. Mild dorsal wrist soft tissue swelling with small ossific density overlying the proximal carpal row. Correlate for triquetral fracture.  2. Moderate scaphotrapeziotrapezoid and mild first CMC joint osteoarthritis. L elbow x-ray: 1. No acute fracture or dislocation, left elbow.  2. Mild elevation of the anterior fat pad suggestive of a small joint effusion, which may be reactive in the setting of elbow osteoarthritis. L shoulder x-rays: No acute osseous abnormality.  COGNITION: Overall cognitive status: Within functional limits for tasks assessed and Impaired: Memory: Impaired: Short term Procedural   SENSATION: Peripheral neuropathy in B feet  COORDINATION: Mildly impaired with slow movements  EDEMA:  Mild to moderate edema in distal B LE  MUSCLE LENGTH: Hamstrings: mod tight R>L ITB: mod tight B Piriformis: mod tight  L>R Hip flexors: mod tight B Quads: mod tight B Heelcord: mod tight B  POSTURE:  rounded shoulders, forward head, and flexed trunk   LOWER EXTREMITY MMT:   Tested in sitting on eval MMT Right 02/22/22 Left 02/22/22  Hip flexion 3+ 4-  Hip extension 3- 3-  Hip abduction 4- 4  Hip adduction 4 4  Hip internal rotation 3 3+  Hip external rotation 3+ 3+  Knee flexion 4- 4-  Knee extension 4- 4  Ankle dorsiflexion 4- 4  Ankle plantarflexion 3+  6 SLS HR 4 10 SLS HR  Ankle inversion 3+ 4-  Ankle eversion 3+ 4-  (Blank rows = not tested)  BED MOBILITY:  Sit to supine SBA Supine to sit SBA  TRANSFERS: Assistive device utilized: Single point cane and None  Sit to stand: Modified independence Stand to sit: Modified independence Chair to chair: Modified independence Floor:  NT  GAIT: Gait pattern: step through pattern, decreased stride length, decreased hip/knee flexion- Right, decreased hip/knee flexion- Left, decreased trunk rotation, and wide BOS Distance walked: 80 Assistive device utilized: Single point cane Level of assistance: SBA Comments: Gait speed: 2.10 ft/sec with SPC, 2.31 ft/sec w/o AD  RAMP: Level of Assistance:  NT Assistive device utilized:    Ramp Comments:   CURB:  Level of Assistance:  NT Assistive device utilized:    Curb Comments:   STAIRS:  Level of Assistance:  NT  Stair Negotiation Technique:   Number of Stairs:    Height of Stairs:   Comments:   FUNCTIONAL TESTS:  5 times sit to stand: 13.0 sec Timed up and go (TUG): 13.53 sec with SPC; 14.19 sec w/o AD 10 meter walk test: 15.60 sec with SPC; 13.04 sec w/o AD Berg Balance Scale: 39/56 - scores 37-45 indicate significant risk for falls (>80%) Dynamic Gait Index: 11/24; Scores of 19 or less are predictive of falls in older community living adults  PATIENT SURVEYS:  ABC scale 55% of self confidence; 45% impairment  TODAY'S TREATMENT:   02/24/22 Completed 10MWT/gait speed & DGI  assessment THERAPEUTIC EXERCISE: to improve flexibility, strength and mobility.  Verbal and tactile cues throughout for technique. NuStep L4 x 6 min Seated B hamstring stretch 2 x 30 sec Seated B figure 4 piriformis stretch with foot resting on 9" stool in front and LE/hip ER 2 x 30 sec (modified as pt reports he is unable to bring his ankle up to his opposite knee) Standing B gastroc stretch at counter  2 x 30 sec Standing B soleus stretch at counter  2 x 30 sec Standing B runner's position hip flexor stretch with leg crossed behind other leg to add ITB stretch 2 x 30 sec  Verbally reviewed and discussed modifications of the following stretches and exercises from his prior HEP but not attempted today: - side stepping with resistance at ankles and counter support   - forward and backward step over with counter support   - turning in corner 360   - tandem stance in corner  (instead of at counter to reduce pressure on hand/wrist) - single leg stance with support   - squat with chair touch  (in place of kettle bell squat) - supine KTOS piriformis stretch with foot on ground  (in place of seated version) - supine lower trunk rotation   - bent knee fallouts with alternating legs    02/22/22 Eval only   PATIENT EDUCATION: Education details: PT eval findings and initial HEP Person educated: Patient Education method: Explanation, Demonstration, Tactile cues, Verbal cues, and Handouts Education comprehension: verbalized understanding, returned demonstration, verbal cues required, tactile cues required, and needs further education   HOME EXERCISE PROGRAM: Access Code: [email or web address]  Exercises - Standing Gastroc Stretch at Counter  - 1-2 x daily - 7 x weekly - 2-3 reps - 30 sec hold - Standing Soleus Stretch at Counter  - 1-2 x daily - 7 x weekly - 2-3 reps - 30 sec hold - Side Stepping  with Resistance at Ankles and Counter Support  - 1 x daily - 7 x weekly - 2 sets - 10 reps - Forward and  Backward Step Over with Counter Support  - 1 x daily - 7 x weekly - 2 sets - 10 reps - Turning in Corner 360  - 1 x daily - 7 x weekly - 2 sets - 5 reps - Tandem Stance in Corner  - 1 x daily - 7 x weekly - 2 reps - 30 sec hold - Single Leg Stance with Support  - 1 x daily - 7 x weekly - 2 sets - 30 sec hold - Squat with Chair Touch  - 1 x daily - 7 x weekly - 2 sets - 10 reps - 3 sec hold - Seated Hamstring Stretch  - 1-2 x daily - 7 x weekly - 2 sets - 30 sec hold - Seated Figure 4 Piriformis Stretch  - 1-2 x daily - 7 x weekly - 2 sets - 30 sec hold - Supine Piriformis Stretch with Foot on Ground  - 1-2 x daily - 7 x weekly - 2-3 reps - 30 sec hold - Supine Lower Trunk Rotation  - 1 x daily - 7 x weekly - 2 sets - 30 sec hold - Bent Knee Fallouts with Alternating Legs  - 1 x daily - 7 x weekly - 2 sets - 10 reps - 3 sec hold   ASSESSMENT:  CLINICAL IMPRESSION: Tyqwan "[name]" reports his legs feel weak and "nervy" today but pain only in his back and not in his calves today. Completed assessment of gait speed and dynamic gait balance today. Gait speed places him as a limited community ambulator but DGI of 11/24 indicates a high risk for falls. He was observed to have multiple incidences where he would catch his foot (he reports h/o foot drop) and stumble but was able to self-correct the LOB w/o falling, although PT maintaining CGA to close SBA during gait and balance assessment. Remainder of session focused on a review and update of his HEP from his previous PT episode with adjustments and modifications made to improve tolerance and/or safety with exercises.  OBJECTIVE IMPAIRMENTS Abnormal gait, decreased activity tolerance, decreased balance, decreased endurance, decreased knowledge of condition, decreased knowledge of use of DME, decreased mobility, difficulty walking, decreased ROM, decreased strength, decreased safety awareness, increased fascial restrictions, impaired perceived functional  ability, increased muscle spasms, impaired flexibility, improper body mechanics, postural dysfunction, and pain.   ACTIVITY LIMITATIONS carrying, lifting, bending, standing, squatting, stairs, transfers, and locomotion level  PARTICIPATION LIMITATIONS: cleaning, laundry, interpersonal relationship, community activity, and yard work  PERSONAL FACTORS Age, Fitness, Past/current experiences, Time since onset of injury/illness/exacerbation, and 3+ comorbidities: Aortic atherosclerosis; carotid stenosis; cervical myelopathy s/p ACDF 06/2020 and posterior cervical fusion 11/2020; peripheral neuropathy; lumbar DDD/spondylosis; lumbago with sciatica/lumbar radiculopathy; OA; B TKA; BTHA; L greater trochanteric pain syndrome; L RCR 2014; recent CTR surgery - early May; hearing loss; HTN; BPPV/vertigo; B sacroiliitis; skin cancer; COPD/emphysema; sleep apnea with CPAP; GERD; gout; HLD; LE cramps; RLS  are also affecting patient's functional outcome.   REHAB POTENTIAL: Good  CLINICAL DECISION MAKING: Evolving/moderate complexity  EVALUATION COMPLEXITY: Moderate  GOALS: Goals reviewed with patient? Yes  SHORT TERM GOALS: Target date: 03/22/2022   Patient will be independent with initial HEP. Baseline:  Goal status: IN PROGRESS  2.  Patient will demonstrate decreased fall risk by scoring > 45/56 on Berg. Baseline: 39/56 Goal status: IN PROGRESS  3.  Patient will be educated on strategies to decrease risk of falls.  Baseline:  Goal status: IN PROGRESS   LONG TERM GOALS: Target date: 04/19/2022   Patient will be independent with advanced/ongoing HEP to improve outcomes and carryover.  Baseline:  Goal status: IN PROGRESS  2.  Patient will be able to ambulate 600' with LRAD with good safety to access community.  Baseline:  Goal status: IN PROGRESS  3.  Patient will be able to step up/down curb safely with LRAD for safety with community ambulation.  Baseline:  Goal status: IN PROGRESS   4.   Patient will demonstrate improved functional LE strength as demonstrated by overall MMT >/= 4 to 4+/5. Baseline:  Goal status: IN PROGRESS  5.  Patient will demonstrate at least 19/24 on DGI to improve gait stability and reduce risk for falls. Baseline: 11/24 Goal status: IN PROGRESS  6.  Patient will score >/= 52/56 on Berg Balance test to demonstrate lower risk of falls. (MCID= 8 points) .  Baseline: 39/56 Goal status: IN PROGRESS  7.  Patient will report >67% of self confidence on ABC scale to demonstrate improved functional ability. Baseline: 55% self confidence; 45 % impairment Goal status: IN PROGRESS  8.  Patient will demonstrate gait speed of >/= 2.62 ft/sec (0.80 m/s) to be a safe community ambulator with decreased risk for recurrent falls.  Baseline: 2.10 ft/sec with SPC, 2.31 ft/sec w/o AD Goal status: IN PROGRESS   PLAN: PT FREQUENCY: 2x/week  PT DURATION: 8 weeks  PLANNED INTERVENTIONS: Therapeutic exercises, Therapeutic activity, Neuromuscular re-education, Balance training, Gait training, Patient/Family education, Joint mobilization, Stair training, Vestibular training, Canalith repositioning, Dry Needling, Electrical stimulation, Spinal manipulation, Cryotherapy, Moist heat, Taping, Ultrasound, Ionotophoresis '4mg'$ /ml Dexamethasone, Manual therapy, and Re-evaluation  PLAN FOR NEXT SESSION: review HEP modifications for improved tolerance; progress lumbopelvic/LE flexibility and strengthening; balance training   Percival Spanish, PT 02/24/2022, 12:32 PM

## 2022-03-01 ENCOUNTER — Encounter: Payer: Medicare HMO | Admitting: Physical Therapy

## 2022-03-01 DIAGNOSIS — D692 Other nonthrombocytopenic purpura: Secondary | ICD-10-CM | POA: Diagnosis not present

## 2022-03-01 DIAGNOSIS — L608 Other nail disorders: Secondary | ICD-10-CM | POA: Diagnosis not present

## 2022-03-01 DIAGNOSIS — L821 Other seborrheic keratosis: Secondary | ICD-10-CM | POA: Diagnosis not present

## 2022-03-01 DIAGNOSIS — Z85828 Personal history of other malignant neoplasm of skin: Secondary | ICD-10-CM | POA: Diagnosis not present

## 2022-03-01 DIAGNOSIS — L570 Actinic keratosis: Secondary | ICD-10-CM | POA: Diagnosis not present

## 2022-03-01 DIAGNOSIS — D1801 Hemangioma of skin and subcutaneous tissue: Secondary | ICD-10-CM | POA: Diagnosis not present

## 2022-03-07 ENCOUNTER — Telehealth: Payer: Self-pay | Admitting: Family

## 2022-03-07 ENCOUNTER — Ambulatory Visit (INDEPENDENT_AMBULATORY_CARE_PROVIDER_SITE_OTHER): Payer: Medicare HMO | Admitting: Family

## 2022-03-07 ENCOUNTER — Ambulatory Visit (HOSPITAL_BASED_OUTPATIENT_CLINIC_OR_DEPARTMENT_OTHER)
Admission: RE | Admit: 2022-03-07 | Discharge: 2022-03-07 | Disposition: A | Discharge status: Home / Self Care | Payer: Medicare HMO | Source: Ambulatory Visit | Attending: Family | Admitting: Family

## 2022-03-07 DIAGNOSIS — R195 Other fecal abnormalities: Secondary | ICD-10-CM

## 2022-03-07 DIAGNOSIS — D649 Anemia, unspecified: Secondary | ICD-10-CM | POA: Diagnosis not present

## 2022-03-07 DIAGNOSIS — M1A0720 Idiopathic chronic gout, left ankle and foot, without tophus (tophi): Secondary | ICD-10-CM

## 2022-03-07 DIAGNOSIS — K219 Gastro-esophageal reflux disease without esophagitis: Secondary | ICD-10-CM

## 2022-03-07 DIAGNOSIS — G4731 Primary central sleep apnea: Secondary | ICD-10-CM | POA: Diagnosis not present

## 2022-03-07 DIAGNOSIS — R2681 Unsteadiness on feet: Secondary | ICD-10-CM | POA: Diagnosis not present

## 2022-03-07 DIAGNOSIS — J309 Allergic rhinitis, unspecified: Secondary | ICD-10-CM | POA: Diagnosis not present

## 2022-03-07 DIAGNOSIS — G4739 Other sleep apnea: Secondary | ICD-10-CM

## 2022-03-07 DIAGNOSIS — I6523 Occlusion and stenosis of bilateral carotid arteries: Secondary | ICD-10-CM | POA: Diagnosis not present

## 2022-03-07 DIAGNOSIS — I10 Essential (primary) hypertension: Secondary | ICD-10-CM

## 2022-03-07 DIAGNOSIS — E039 Hypothyroidism, unspecified: Secondary | ICD-10-CM | POA: Diagnosis not present

## 2022-03-07 DIAGNOSIS — E785 Hyperlipidemia, unspecified: Secondary | ICD-10-CM

## 2022-03-07 DIAGNOSIS — R739 Hyperglycemia, unspecified: Secondary | ICD-10-CM

## 2022-03-07 LAB — TSH: TSH: 0.76 u[IU]/mL (ref 0.35–5.50)

## 2022-03-07 LAB — HEMOGLOBIN A1C: Hgb A1c MFr Bld: 5.5 % (ref 4.6–6.5)

## 2022-03-07 NOTE — Assessment & Plan Note (Signed)
>>  ASSESSMENT AND PLAN FOR BENIGN ESSENTIAL HYPERTENSION WRITTEN ON 03/07/2022 11:06 AM BY O'SULLIVAN, Inessa Wardrop, NP  BP Readings from Last 3 Encounters:  03/07/22 (!) 121/47  02/03/22 (!) 155/66  01/26/22 (!) 128/54   Continue amlodipine and losartan.  Stable.

## 2022-03-07 NOTE — Assessment & Plan Note (Signed)
>>  ASSESSMENT AND PLAN FOR COMPLEX SLEEP APNEA SYNDROME WRITTEN ON 03/07/2022 11:08 AM BY O'SULLIVAN, Alexina Niccoli, NP  Continues cpap.

## 2022-03-07 NOTE — Assessment & Plan Note (Signed)
Still has drainage despite flonase. Recommended that he add claritin.

## 2022-03-07 NOTE — Telephone Encounter (Signed)
Can you please contact Dr. Jennye Boroughs office and request GI records from the last 5 years?

## 2022-03-07 NOTE — Therapy (Signed)
OUTPATIENT PHYSICAL THERAPY TREATMENT   Patient Name: [name] Pratt MRN: [id number] DOB:09/25/1940, 81 y.o., male Today's Date: 03/08/2022      PT End of Session - 03/08/22 1102     Visit Number 3    Number of Visits 17    Date for PT Re-Evaluation 04/19/22    Authorization Type [name]    PT Start Time 1102    PT Stop Time 1144    PT Time Calculation (min) 42 min    Activity Tolerance Patient tolerated treatment well    Behavior During Therapy [facility] for tasks assessed/performed               Past Medical History:  Diagnosis Date   Allergic rhinitis 02/02/2016   Anemia 12/17/2009   Formatting of this note might be different from the original. Anemia  10/1 IMO update   Aortic atherosclerosis ([place]) 03/05/2020   Arthritis    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   Atypical chest pain 11/24/2015   Benign essential hypertension 05/13/2009   Qualifier: Diagnosis of  By: Larwance Sachs of this note might be different from the original. Hypertension   Benign paroxysmal positional vertigo 09/14/2021   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[place]) 07/22/2021   Body mass index (BMI) 25.0-25.9, adult 11/02/2020   Cancer ([place])    skin cancer   Cardiac murmur 07/25/2021   Carotid stenosis    a. Carotid U/S 9/67: LICA < 59%, RICA 16-38%;  b.  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   Cystoid macular edema of both eyes 07/28/2020   Degenerative disc disease, lumbar 06/30/2015   Deviated nasal septum 02/18/2015   Diverticulosis 03/03/2020   DJD (degenerative joint disease) of hip 12/24/2012   Dyspnea  06/12/2011   Emphysema, unspecified ([place]) 03/05/2020   Erectile dysfunction 06/20/2017   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[place]) 02/09/2021   Gait disorder 03/02/2021   GERD (gastroesophageal reflux disease)    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   Qualifier: Diagnosis of  By: Burnett Kanaris     Greater trochanteric pain syndrome 07/22/2021   Hand pain, left 01/16/2022   Hearing loss 02/02/2016   Heme positive stool 02/29/2020   HIATAL HERNIA 05/13/2009   Qualifier: Diagnosis of  By: Burnett Kanaris     High risk medication use 01/18/2012   Hip pain 03/02/2021   History of chicken pox    History of COVID-19 12/05/2021   History of hemorrhoids    History of kidney stones    History of skin cancer 07/22/2021   skin cancer   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 Cardiology - Dr Frances Nickels at [facility] cardiology  ICD-10 cut over   Hypertension    under control   Hypokalemia 07/08/2021   Hypothyroidism    Internal hemorrhoids 03/03/2020   Leg cramps 11/13/2019   Lower GI bleed 12/12/2012   Lumbago of lumbar region with sciatica 10/21/2020   Lumbar radiculopathy 03/02/2021  Lumbar spondylosis 11/02/2020   NEPHROLITHIASIS 05/13/2009   Qualifier: Diagnosis of  By: Burnett Kanaris     Nonspecific abnormal electrocardiogram (ECG) (EKG) 01/06/2013   Numbness of hand 01/24/2022   Onychomycosis 06/30/2015   Osteoarthrosis, 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 Osteoarthritis Of The Hand  10/1 IMO update   Osteoarthrosis, unspecified whether generalized or localized, pelvic region and thigh 07/25/2010   Formatting of this note might be different from the original. Osteoarthritis Of The Hip   Other abnormal glucose 07/22/2021   Other  allergic rhinitis 02/18/2015   Palpitations 07/25/2021   Peripheral neuropathy 06/19/2016   Preventative health care 11/24/2015   Right groin pain 11/29/2012   Right inguinal hernia 05/13/2009   Qualifier: Diagnosis of  By: Burnett Kanaris     RLS (restless legs syndrome) 02/18/2015   Rosacea    Rotator cuff tear 07/27/2013   Situational depression 03/02/2021   Skin lesion 01/16/2022   Sleep apnea with use of continuous positive airway pressure (CPAP) 07/15/2013   CPAP set to  7 cm water,  Residual AHi was 7.8  With no leak.  User time 6 hours.  479 days , not used only 12 days - highly compliant 06-23-13  .    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   Urinary retention 11/26/2020   Ventral hernia 07/08/2012   Vertigo 07/08/2021   Weight loss 03/03/2020   Past Surgical History:  Procedure Laterality Date   ABDOMINAL HERNIA REPAIR  07/15/12   Dr [name]   ANTERIOR CERVICAL DECOMP/DISCECTOMY FUSION N/A 07/09/2020   Procedure: ANTERIOR CERVICAL DECOMPRESSION AND FUSION CERVICAL THREE-FOUR.;  Surgeon: [name], MD;  Location: [place];  Service: Neurosurgery;  Laterality: N/A;  anterior   BROW LIFT  05/07/01   CYSTOSCOPY WITH URETEROSCOPY AND STENT PLACEMENT Right 01/28/2014   Procedure: CYSTOSCOPY WITH URETEROSCOPY, BASKET RETRIVAL AND  STENT PLACEMENT;  Surgeon: [name], MD;  Location: [facility];  Service: Urology;  Laterality: Right;   epidural injections     multiple procedures   ESOPHAGEAL DILATION  1993 and 1994   multiple times   EYE SURGERY Bilateral 03/25/10, 2012   cataract removal   HEMORRHOID SURGERY  2014   HIATAL HERNIA REPAIR  12/21/93    HOLMIUM LASER APPLICATION Right 1/93/7902   Procedure: HOLMIUM LASER APPLICATION;  Surgeon: [name], MD;  Location: [facility];  Service: Urology;  Laterality: Right;   INGUINAL HERNIA REPAIR Right 07/15/12   Dr [name], x2   JOINT REPLACEMENT  07/25/04   right knee   JOINT REPLACEMENT  07/13/10   left knee   [place]  09/20/06   with intraoperative cholangiogram and right inguinal herniorrhaphy with mesh    LIGAMENT REPAIR Left 07/2013   shoulder   LITHOTRIPSY     x2   [place] FUSION/FORAMINOTOMY N/A 11/24/2020   Procedure: Posterior Cervical Laminectomy Cervical three-four, Cervical four-five with lateral mass fusion;  Surgeon: [name], MD;  Location: [place];  Service: Neurosurgery;  Laterality: N/A;   REFRACTIVE SURGERY Bilateral  Right total hip replacement  4/14   SKIN CANCER DESTRUCTION     nose, ear   TONSILLECTOMY  as child   TOTAL HIP ARTHROPLASTY Left    URETHRAL DILATION  1991   [name]   Patient Active Problem List   Diagnosis Date Noted   Irregular heart beat 02/03/2022   Constipation 02/03/2022   Carpal tunnel syndrome 01/24/2022   Numbness of hand 01/24/2022   Ulnar neuropathy 01/24/2022   Skin lesion 01/16/2022   Hand pain, left 01/16/2022   History of COVID-19 12/05/2021   Benign paroxysmal positional vertigo 09/14/2021   Palpitations 07/25/2021   Cardiac murmur 07/25/2021   History of chicken pox 07/22/2021   History of hemorrhoids 07/22/2021   Hypertension 07/22/2021   COPD (chronic obstructive pulmonary disease) ([place]) 07/22/2021   History of kidney stones 07/22/2021   History of skin cancer 16/06/9603   Complication of anesthesia 07/22/2021   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[place]) 07/22/2021   Other abnormal glucose 07/22/2021   Spondylitis ([place]) 07/22/2021   Greater trochanteric pain syndrome 07/22/2021   Vertigo 07/08/2021   Hypokalemia 07/08/2021   Trochanteric  bursitis of left hip 06/30/2021   Cancer ([place]) 04/11/2021   Lumbar radiculopathy 03/02/2021   Gait instability 03/02/2021   Hip pain 03/02/2021   Situational depression 03/02/2021   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[place]) 02/09/2021   Urinary retention 11/26/2020   Cervical myelopathy ([place]) 11/24/2020   Body mass index (BMI) 25.0-25.9, adult 11/02/2020   Lumbar spondylosis 11/02/2020   Lumbago of lumbar region with sciatica 10/21/2020   Cystoid macular edema of both eyes 07/28/2020   Status post cervical spinal fusion 07/09/2020   History of total bilateral knee replacement 07/08/2020   Aortic atherosclerosis ([place]) 03/05/2020   Emphysema, unspecified ([place]) 03/05/2020   Arthritis 03/04/2020   Diverticulosis 03/03/2020   Internal hemorrhoids 03/03/2020   Weight loss 03/03/2020   Heme positive stool 02/29/2020   Leg cramps 11/13/2019   Tibialis anterior tendon tear, nontraumatic 11/13/2019   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   CPAP (continuous positive airway pressure) dependence 03/25/2018   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   Erectile dysfunction 06/20/2017   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 03/06/2017   Peripheral neuropathy 06/19/2016   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   Hearing loss 02/02/2016   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   Allergic rhinitis 02/02/2016   Atypical chest pain 11/24/2015   Preventative health care 11/24/2015   Onychomycosis 06/30/2015   Degenerative disc disease, lumbar 06/30/2015   Other allergic rhinitis 02/18/2015   Deviated nasal septum 02/18/2015   RLS (restless legs syndrome) 02/18/2015   GERD (gastroesophageal reflux disease) 09/18/2014   Hyperglycemia 03/03/2014   Rotator cuff tear 07/27/2013   Sleep apnea with use of continuous positive airway pressure (CPAP) 07/15/2013   Carotid stenosis 02/24/2013    Nonspecific abnormal electrocardiogram (ECG) (EKG) 01/06/2013   DJD (degenerative joint disease) of hip 12/24/2012   Lower GI bleed 12/12/2012   Right groin pain 11/29/2012   Ventral hernia 07/08/2012   High risk medication use 01/18/2012   Dyspnea 06/12/2011   Hyperlipidemia with target LDL less than 130 12/27/2010   Osteoarthrosis, unspecified whether generalized or localized, pelvic region and thigh 07/25/2010   Osteoarthrosis, hand 06/13/2010   Anemia 12/17/2009   Hypothyroidism 05/13/2009   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   Benign essential hypertension 05/13/2009   Right inguinal hernia 05/13/2009   HIATAL HERNIA 05/13/2009  NEPHROLITHIASIS 05/13/2009   ROSACEA 05/13/2009    PCP: [name], NP  REFERRING PROVIDER: [name], NP  REFERRING DIAG: R26.81 (ICD-10-CM) - Gait instability   THERAPY DIAG:  Unsteadiness on feet  Other abnormalities of gait and mobility  Muscle weakness (generalized)  RATIONALE FOR EVALUATION AND TREATMENT:  Rehabilitation  ONSET DATE:  Fall 01/12/22 (2 falls in past 6 months)  SUBJECTIVE:   SUBJECTIVE STATEMENT: Just feeling weak.  Pt accompanied by: self  PAIN:  Are you having pain? Yes: NPRS scale: 3-4/10 Pain location: B low back and R>L posterior hip/buttocks Pain description: aching Aggravating factors: prolonged sitting or standing Relieving factors: walking around  PERTINENT HISTORY:  Aortic atherosclerosis; carotid stenosis; cervical myelopathy s/p ACDF 06/2020 and posterior cervical fusion 11/2020; peripheral neuropathy; lumbar DDD/spondylosis; lumbago with sciatica/lumbar radiculopathy; OA; B TKA; BTHA; L greater trochanteric pain syndrome; L RCR 2014; recent CTR surgery - early May; hearing loss; HTN; BPPV/vertigo; B sacroiliitis; skin cancer; COPD/emphysema; sleep apnea with CPAP; GERD; gout; HLD; LE cramps; RLS  PRECAUTIONS: None  WEIGHT BEARING RESTRICTIONS: No  FALLS: Has patient fallen in last 6 months? Yes.  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 No acute intracranial abnormality.  2. No acute fracture or traumatic subluxation of the cervical spine. L wrist x-ray: 1. Mild dorsal wrist soft tissue swelling with small ossific density overlying the proximal carpal row. Correlate for triquetral fracture.  2. Moderate scaphotrapeziotrapezoid and mild first CMC joint osteoarthritis. L elbow x-ray: 1. No acute fracture or dislocation, left elbow.  2. Mild elevation of the anterior fat pad suggestive of a small joint effusion, which may be reactive in the setting of elbow osteoarthritis. L shoulder x-rays: No acute osseous abnormality.  COGNITION: Overall cognitive status: Within functional limits for tasks assessed and Impaired: Memory: Impaired: Short term Procedural   SENSATION: Peripheral neuropathy in B feet  COORDINATION: Mildly impaired with slow movements  EDEMA:  Mild to moderate edema in distal B LE  MUSCLE LENGTH: Hamstrings: mod tight R>L ITB: mod tight B Piriformis: mod tight L>R Hip flexors: mod tight B Quads: mod tight B Heelcord: mod tight B  POSTURE:  rounded shoulders, forward head, and flexed trunk   LOWER EXTREMITY MMT:   Tested in sitting on  eval MMT Right 02/22/22 Left 02/22/22  Hip flexion 3+ 4-  Hip extension 3- 3-  Hip abduction 4- 4  Hip adduction 4 4  Hip internal rotation 3 3+  Hip external rotation 3+ 3+  Knee flexion 4- 4-  Knee extension 4- 4  Ankle dorsiflexion 4- 4  Ankle plantarflexion 3+  6 SLS HR 4 10 SLS HR  Ankle inversion 3+ 4-  Ankle eversion 3+ 4-  (Blank rows = not tested)  BED MOBILITY:  Sit to supine SBA Supine to sit SBA  TRANSFERS: Assistive device utilized: Single point cane and None  Sit to stand: Modified independence Stand to sit: Modified independence Chair to chair: Modified independence Floor:  NT  GAIT: Gait pattern: step through pattern, decreased stride length, decreased hip/knee flexion- Right, decreased hip/knee flexion- Left, decreased trunk rotation, and wide BOS Distance walked: 80 Assistive device utilized: Single point cane Level of assistance: SBA Comments: Gait speed: 2.10 ft/sec with SPC, 2.31 ft/sec w/o AD  RAMP: Level of Assistance:  NT Assistive device utilized:    Ramp Comments:   CURB:  Level of Assistance:  NT Assistive device utilized:    Curb Comments:   STAIRS:  Level of Assistance:  NT  Stair Negotiation Technique:   Number of Stairs:    Height of Stairs:   Comments:   FUNCTIONAL TESTS:  5 times sit to stand: 13.0 sec Timed up and go (TUG): 13.53 sec with SPC; 14.19 sec w/o AD 10 meter walk test: 15.60 sec with SPC; 13.04 sec w/o AD Berg Balance Scale: 39/56 - scores 37-45 indicate significant risk for falls (>80%) Dynamic Gait Index: 11/24; Scores of 19 or less are predictive of falls in older community living adults  PATIENT SURVEYS:  ABC scale 55% of self confidence; 45% impairment  TODAY'S TREATMENT:   03/08/22 THERAPEUTIC EXERCISE: to improve flexibility, strength and mobility.  Verbal and tactile cues throughout for technique. NuStep L4 x 6 min Standing hip ABD 5 sec hold x 10 bil (pt reports difficulty with getting band around  ankles due to neuropathy in hands) forward and backward step over 1/2 foam roller with counter support  x 10 bil turning in corner 360 x 3 ea way cues to pause between reps to avoid dizziness Tandem stance multiple reps; semi tandem x 30 sec bil.  SLS with one UE support bil multiple reps Sit to stand x 10 Squat touch to mat + foam pad x 10   02/24/22 Completed 10MWT/gait speed & DGI assessment THERAPEUTIC EXERCISE: to improve flexibility, strength and mobility.  Verbal and tactile cues throughout for technique. NuStep L4 x 6 min Seated B hamstring stretch 2 x 30 sec Seated B figure 4 piriformis stretch with foot resting on 9" stool in front and LE/hip ER 2 x 30 sec (modified as pt reports he is unable to bring his ankle up to his opposite knee) Standing B gastroc stretch at counter  2 x 30 sec Standing B soleus stretch at counter  2 x 30 sec Standing B runner's position hip flexor stretch with leg crossed behind other leg to add ITB stretch 2 x 30 sec  Verbally reviewed and discussed modifications of the following stretches and exercises from his prior HEP but not attempted today: - side stepping with resistance at ankles and counter support   - forward and backward step over with counter support   - turning in corner 360   - tandem stance in corner  (instead of at counter to reduce pressure on hand/wrist) - single leg stance with support   - squat with chair touch  (in place of kettle bell squat) - supine KTOS piriformis stretch with foot on ground  (in place of seated version) - supine lower trunk rotation   - bent knee fallouts with alternating legs    02/22/22 Eval only   PATIENT EDUCATION: Education details: PT eval findings and initial HEP Person educated: Patient Education method: Explanation, Demonstration, Tactile cues, Verbal cues, and  Handouts Education comprehension: verbalized understanding, returned demonstration, verbal cues required, tactile cues required, and  needs further education   HOME EXERCISE PROGRAM: Access Code: [email or web address]  Exercises - Standing Gastroc Stretch at Counter  - 1-2 x daily - 7 x weekly - 2-3 reps - 30 sec hold - Standing Soleus Stretch at Counter  - 1-2 x daily - 7 x weekly - 2-3 reps - 30 sec hold - Side Stepping with Resistance at Ankles and Counter Support  - 1 x daily - 7 x weekly - 2 sets - 10 reps - Forward and Backward Step Over with Counter Support  - 1 x daily - 7 x weekly - 2 sets - 10 reps - Turning in Corner 360  - 1 x daily - 7 x weekly - 2 sets - 5 reps - Tandem Stance in Corner  - 1 x daily - 7 x weekly - 2 reps - 30 sec hold - Single Leg Stance with Support  - 1 x daily - 7 x weekly - 2 sets - 30 sec hold - Squat with Chair Touch  - 1 x daily - 7 x weekly - 2 sets - 10 reps - 3 sec hold - Seated Hamstring Stretch  - 1-2 x daily - 7 x weekly - 2 sets - 30 sec hold - Seated Figure 4 Piriformis Stretch  - 1-2 x daily - 7 x weekly - 2 sets - 30 sec hold - Supine Piriformis Stretch with Foot on Ground  - 1-2 x daily - 7 x weekly - 2-3 reps - 30 sec hold - Supine Lower Trunk Rotation  - 1 x daily - 7 x weekly - 2 sets - 30 sec hold - Bent Knee Fallouts with Alternating Legs  - 1 x daily - 7 x weekly - 2 sets - 10 reps - 3 sec hold   ASSESSMENT:  CLINICAL IMPRESSION: [name] Pratt" did very well today. We reviewed more of his current HEP that was modified at eval. Sidestepping with band modified further to prolonged holds in ABD due to difficulty getting band around ankles at home. With step overs he intermittently catches his left toe on obstacle. Weak due to previous injury he reports. Remaining three exercises still need to be reviewed at next session.  OBJECTIVE IMPAIRMENTS Abnormal gait, decreased activity tolerance, decreased balance, decreased endurance, decreased knowledge of condition, decreased knowledge of use of DME, decreased mobility, difficulty walking, decreased ROM, decreased strength, decreased  safety awareness, increased fascial restrictions, impaired perceived functional ability, increased muscle spasms, impaired flexibility, improper body mechanics, postural dysfunction, and pain.   ACTIVITY LIMITATIONS carrying, lifting, bending, standing, squatting, stairs, transfers, and locomotion level  PARTICIPATION LIMITATIONS: cleaning, laundry, interpersonal relationship, community activity, and yard work  PERSONAL FACTORS Age, Fitness, Past/current experiences, Time since onset of injury/illness/exacerbation, and 3+ comorbidities: Aortic atherosclerosis; carotid stenosis; cervical myelopathy s/p ACDF 06/2020 and posterior cervical fusion 11/2020; peripheral neuropathy; lumbar DDD/spondylosis; lumbago with sciatica/lumbar radiculopathy; OA; B TKA; BTHA; L greater trochanteric pain syndrome; L RCR 2014; recent CTR surgery - early May; hearing loss; HTN; BPPV/vertigo; B sacroiliitis; skin cancer; COPD/emphysema; sleep apnea with CPAP; GERD; gout; HLD; LE cramps; RLS  are also affecting patient's functional outcome.   REHAB POTENTIAL: Good  CLINICAL DECISION MAKING: Evolving/moderate complexity  EVALUATION COMPLEXITY: Moderate  GOALS: Goals reviewed with patient? Yes  SHORT TERM GOALS: Target date: 03/22/2022   Patient will be independent with initial HEP. Baseline:  Goal status: IN PROGRESS  2.  Patient will demonstrate decreased fall risk by scoring > 45/56 on Berg. Baseline: 39/56 Goal status: IN PROGRESS  3.  Patient will be educated on strategies to decrease risk of falls.  Baseline:  Goal status: IN PROGRESS   LONG TERM GOALS: Target date: 04/19/2022   Patient will be independent with advanced/ongoing HEP to improve outcomes and carryover.  Baseline:  Goal status: IN PROGRESS  2.  Patient will be able to ambulate 600' with LRAD with good safety to access community.  Baseline:  Goal status: IN PROGRESS  3.  Patient will be able to step up/down curb safely with LRAD for  safety with community ambulation.  Baseline:  Goal status: IN PROGRESS   4.  Patient will demonstrate improved functional LE strength as demonstrated by overall MMT >/= 4 to 4+/5. Baseline:  Goal status: IN PROGRESS  5.  Patient will demonstrate at least 19/24 on DGI to improve gait stability and reduce risk for falls. Baseline: 11/24 Goal status: IN PROGRESS  6.  Patient will score >/= 52/56 on Berg Balance test to demonstrate lower risk of falls. (MCID= 8 points) .  Baseline: 39/56 Goal status: IN PROGRESS  7.  Patient will report >67% of self confidence on ABC scale to demonstrate improved functional ability. Baseline: 55% self confidence; 45 % impairment Goal status: IN PROGRESS  8.  Patient will demonstrate gait speed of >/= 2.62 ft/sec (0.80 m/s) to be a safe community ambulator with decreased risk for recurrent falls.  Baseline: 2.10 ft/sec with SPC, 2.31 ft/sec w/o AD Goal status: IN PROGRESS   PLAN: PT FREQUENCY: 2x/week  PT DURATION: 8 weeks  PLANNED INTERVENTIONS: Therapeutic exercises, Therapeutic activity, Neuromuscular re-education, Balance training, Gait training, Patient/Family education, Joint mobilization, Stair training, Vestibular training, Canalith repositioning, Dry Needling, Electrical stimulation, Spinal manipulation, Cryotherapy, Moist heat, Taping, Ultrasound, Ionotophoresis '4mg'$ /ml Dexamethasone, Manual therapy, and Re-evaluation  PLAN FOR NEXT SESSION: review final 3 HEP modifications for improved tolerance; progress lumbopelvic/LE flexibility and strengthening; balance training   Trinty Marken, PT 03/08/2022, 12:38 PM

## 2022-03-07 NOTE — Assessment & Plan Note (Signed)
Lab Results  Component Value Date   WBC 6.2 11/17/2021   HGB 12.1 (L) 11/17/2021   HCT 36.8 (L) 11/17/2021   MCV 97.9 11/17/2021   PLT 318 11/17/2021   Stable in March. Monitor.

## 2022-03-07 NOTE — Assessment & Plan Note (Signed)
No recent gout flares, continues allopurinol.

## 2022-03-07 NOTE — Assessment & Plan Note (Signed)
He reports that he had a work up with Dr. Kinnie Scales previously.   Will request these records.

## 2022-03-08 ENCOUNTER — Ambulatory Visit: Payer: Medicare HMO | Admitting: Physical Therapy

## 2022-03-08 ENCOUNTER — Encounter: Payer: Self-pay | Admitting: Physical Therapy

## 2022-03-08 DIAGNOSIS — M6281 Muscle weakness (generalized): Secondary | ICD-10-CM

## 2022-03-08 DIAGNOSIS — R296 Repeated falls: Secondary | ICD-10-CM | POA: Diagnosis not present

## 2022-03-08 DIAGNOSIS — R2689 Other abnormalities of gait and mobility: Secondary | ICD-10-CM | POA: Diagnosis not present

## 2022-03-08 DIAGNOSIS — R2681 Unsteadiness on feet: Secondary | ICD-10-CM | POA: Diagnosis not present

## 2022-03-08 DIAGNOSIS — M5459 Other low back pain: Secondary | ICD-10-CM | POA: Diagnosis not present

## 2022-03-15 DIAGNOSIS — N1832 Chronic kidney disease, stage 3b: Secondary | ICD-10-CM | POA: Diagnosis not present

## 2022-03-15 DIAGNOSIS — D649 Anemia, unspecified: Secondary | ICD-10-CM | POA: Diagnosis not present

## 2022-03-15 DIAGNOSIS — E785 Hyperlipidemia, unspecified: Secondary | ICD-10-CM | POA: Diagnosis not present

## 2022-03-15 DIAGNOSIS — H8110 Benign paroxysmal vertigo, unspecified ear: Secondary | ICD-10-CM | POA: Diagnosis not present

## 2022-03-15 DIAGNOSIS — M5412 Radiculopathy, cervical region: Secondary | ICD-10-CM | POA: Diagnosis not present

## 2022-03-15 DIAGNOSIS — I129 Hypertensive chronic kidney disease with stage 1 through stage 4 chronic kidney disease, or unspecified chronic kidney disease: Secondary | ICD-10-CM | POA: Diagnosis not present

## 2022-03-15 DIAGNOSIS — Z87442 Personal history of urinary calculi: Secondary | ICD-10-CM | POA: Diagnosis not present

## 2022-03-15 LAB — BASIC METABOLIC PANEL
BUN: 34 — AB (ref 4–21)
Chloride: 103 (ref 99–108)
Glucose: 132
Potassium: 3.8 mEq/L (ref 3.5–5.1)
Sodium: 140 (ref 137–147)

## 2022-03-15 LAB — COMPREHENSIVE METABOLIC PANEL
Albumin: 4.4 (ref 3.5–5.0)
Calcium: 9.6 (ref 8.7–10.7)
eGFR: 59

## 2022-03-16 LAB — PROTEIN / CREATININE RATIO, URINE
Albumin, U: 106.6
Creatinine, Urine: 37.2

## 2022-03-20 ENCOUNTER — Ambulatory Visit: Payer: Medicare HMO | Attending: Family | Admitting: Physical Therapy

## 2022-03-20 DIAGNOSIS — R296 Repeated falls: Secondary | ICD-10-CM | POA: Diagnosis not present

## 2022-03-20 DIAGNOSIS — R2689 Other abnormalities of gait and mobility: Secondary | ICD-10-CM | POA: Insufficient documentation

## 2022-03-20 DIAGNOSIS — R2681 Unsteadiness on feet: Secondary | ICD-10-CM | POA: Diagnosis not present

## 2022-03-20 DIAGNOSIS — M5459 Other low back pain: Secondary | ICD-10-CM | POA: Insufficient documentation

## 2022-03-20 DIAGNOSIS — M6281 Muscle weakness (generalized): Secondary | ICD-10-CM | POA: Insufficient documentation

## 2022-03-20 NOTE — Therapy (Signed)
OUTPATIENT PHYSICAL THERAPY TREATMENT   Patient Name: CEDERICK BROADNAX MRN: [id number] DOB:[birth year], 81 y.o., male Today's Date: 03/20/2022      PT End of Session - 03/20/22 1105     Visit Number 4    Number of Visits 17    Date for PT Re-Evaluation 04/19/22    Authorization Type [name]    Authorization Time Period 02/24/22 - 04/19/22    Authorization - Visit Number 3    Authorization - Number of Visits 16    PT Start Time 1105    PT Stop Time 1152    PT Time Calculation (min) 47 min    Activity Tolerance Patient tolerated treatment well    Behavior During Therapy [facility] for tasks assessed/performed                Past Medical History:  Diagnosis Date   Allergic rhinitis 02/02/2016   Anemia 12/17/2009   Formatting of this note might be different from the original. Anemia  10/1 IMO update   Aortic atherosclerosis ([place]) 03/05/2020   Arthritis    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   Atypical chest pain 11/24/2015   Benign essential hypertension 05/13/2009   Qualifier: Diagnosis of  By: Larwance Sachs of this note might be different from the original. Hypertension   Benign paroxysmal positional vertigo 09/14/2021   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[place]) 07/22/2021   Body mass index (BMI) 25.0-25.9, adult 11/02/2020   Cancer ([place])    skin cancer   Cardiac murmur 07/25/2021   Carotid stenosis    a. Carotid U/S 6/43: LICA < 32%, RICA 95-18%;  b.  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   Cystoid macular edema of both eyes 07/28/2020   Degenerative disc disease, lumbar 06/30/2015   Deviated  nasal septum 02/18/2015   Diverticulosis 03/03/2020   DJD (degenerative joint disease) of hip 12/24/2012   Dyspnea 06/12/2011   Emphysema, unspecified ([place]) 03/05/2020   Erectile dysfunction 06/20/2017   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[facility] [place]) 02/09/2021   Gait disorder 03/02/2021   GERD (gastroesophageal reflux disease)    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   Qualifier: Diagnosis of  By: Burnett Kanaris     Greater trochanteric pain syndrome 07/22/2021   Hand pain, left 01/16/2022   Hearing loss 02/02/2016   Heme positive stool 02/29/2020   HIATAL HERNIA 05/13/2009   Qualifier: Diagnosis of  By: Burnett Kanaris     High risk medication use 01/18/2012   Hip pain 03/02/2021   History of chicken pox    History of COVID-19 12/05/2021   History of hemorrhoids    History of kidney stones    History of skin cancer 07/22/2021   skin cancer   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 Cardiology - Dr Frances Nickels at [facility] cardiology  ICD-10 cut over   Hypertension    under control   Hypokalemia 07/08/2021   Hypothyroidism    Internal hemorrhoids  03/03/2020   Leg cramps 11/13/2019   Lower GI bleed 12/12/2012   Lumbago of lumbar region with sciatica 10/21/2020   Lumbar radiculopathy 03/02/2021   Lumbar spondylosis 11/02/2020   NEPHROLITHIASIS 05/13/2009   Qualifier: Diagnosis of  By: Burnett Kanaris     Nonspecific abnormal electrocardiogram (ECG) (EKG) 01/06/2013   Numbness of hand 01/24/2022   Onychomycosis 06/30/2015   Osteoarthrosis, 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 Osteoarthritis Of The Hand  10/1 IMO update   Osteoarthrosis, unspecified whether generalized or localized, pelvic region and thigh 07/25/2010   Formatting of this  note might be different from the original. Osteoarthritis Of The Hip   Other abnormal glucose 07/22/2021   Other allergic rhinitis 02/18/2015   Palpitations 07/25/2021   Peripheral neuropathy 06/19/2016   Preventative health care 11/24/2015   Right groin pain 11/29/2012   Right inguinal hernia 05/13/2009   Qualifier: Diagnosis of  By: Burnett Kanaris     RLS (restless legs syndrome) 02/18/2015   Rosacea    Rotator cuff tear 07/27/2013   Situational depression 03/02/2021   Skin lesion 01/16/2022   Sleep apnea with use of continuous positive airway pressure (CPAP) 07/15/2013   CPAP set to  7 cm water,  Residual AHi was 7.8  With no leak.  User time 6 hours.  479 days , not used only 12 days - highly compliant 06-23-13  .    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   Urinary retention 11/26/2020   Ventral hernia 07/08/2012   Vertigo 07/08/2021   Weight loss 03/03/2020   Past Surgical History:  Procedure Laterality Date   ABDOMINAL HERNIA REPAIR  07/15/12   Dr [name]   ANTERIOR CERVICAL DECOMP/DISCECTOMY FUSION N/A 07/09/2020   Procedure: ANTERIOR CERVICAL DECOMPRESSION AND FUSION CERVICAL THREE-FOUR.;  Surgeon: [name], MD;  Location: [place];  Service: Neurosurgery;  Laterality: N/A;  anterior   BROW LIFT  05/07/01   CYSTOSCOPY WITH URETEROSCOPY AND STENT PLACEMENT Right 01/28/2014   Procedure: CYSTOSCOPY WITH URETEROSCOPY, BASKET RETRIVAL AND  STENT PLACEMENT;  Surgeon: [name], MD;  Location: [facility];  Service: Urology;  Laterality: Right;   epidural injections     multiple procedures   ESOPHAGEAL DILATION  1993 and 1994   multiple times   EYE  SURGERY Bilateral 03/25/10, 2012   cataract removal   HEMORRHOID SURGERY  2014   HIATAL HERNIA REPAIR  12/21/93   HOLMIUM LASER APPLICATION Right 4/78/2956   Procedure: HOLMIUM LASER APPLICATION;  Surgeon: [name], MD;  Location: [facility];  Service: Urology;  Laterality: Right;   INGUINAL HERNIA REPAIR Right 07/15/12   Dr [name], x2   JOINT REPLACEMENT  07/25/04   right knee   JOINT REPLACEMENT  07/13/10   left knee   [place]  09/20/06   with intraoperative cholangiogram and right inguinal herniorrhaphy with mesh    LIGAMENT REPAIR Left 07/2013   shoulder   LITHOTRIPSY     x2   [place] FUSION/FORAMINOTOMY N/A 11/24/2020   Procedure: Posterior Cervical Laminectomy Cervical three-four, Cervical  four-five with lateral mass fusion;  Surgeon: [name], MD;  Location: [place];  Service: Neurosurgery;  Laterality: N/A;   REFRACTIVE SURGERY Bilateral    Right total hip replacement  4/14   SKIN CANCER DESTRUCTION     nose, ear   TONSILLECTOMY  as child   TOTAL HIP ARTHROPLASTY Left    URETHRAL DILATION  1991   [name]   Patient Active Problem List   Diagnosis Date Noted   Irregular heart beat 02/03/2022   Constipation 02/03/2022   Carpal tunnel syndrome 01/24/2022   Numbness of hand 01/24/2022   Ulnar neuropathy 01/24/2022   Skin lesion 01/16/2022   Hand pain, left 01/16/2022   History of COVID-19 12/05/2021   Benign paroxysmal positional vertigo 09/14/2021   Palpitations 07/25/2021   Cardiac murmur 07/25/2021   History of chicken pox 07/22/2021   History of hemorrhoids 07/22/2021   Hypertension 07/22/2021   COPD (chronic obstructive pulmonary disease) ([place]) 07/22/2021   History of kidney stones 07/22/2021   History of skin cancer 63/87/5643   Complication of anesthesia 07/22/2021   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[place]) 07/22/2021   Other abnormal glucose 07/22/2021   Spondylitis ([place]) 07/22/2021    Greater trochanteric pain syndrome 07/22/2021   Vertigo 07/08/2021   Hypokalemia 07/08/2021   Trochanteric bursitis of left hip 06/30/2021   Cancer ([place]) 04/11/2021   Lumbar radiculopathy 03/02/2021   Gait instability 03/02/2021   Hip pain 03/02/2021   Situational depression 03/02/2021   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[place]) 02/09/2021   Urinary retention 11/26/2020   Cervical myelopathy ([place]) 11/24/2020   Body mass index (BMI) 25.0-25.9, adult 11/02/2020   Lumbar spondylosis 11/02/2020   Lumbago of lumbar region with sciatica 10/21/2020   Cystoid macular edema of both eyes 07/28/2020   Status post cervical spinal fusion 07/09/2020   History of total bilateral knee replacement 07/08/2020   Aortic atherosclerosis ([place]) 03/05/2020   Emphysema, unspecified ([place]) 03/05/2020   Arthritis 03/04/2020   Diverticulosis 03/03/2020   Internal hemorrhoids 03/03/2020   Weight loss 03/03/2020   Heme positive stool 02/29/2020   Leg cramps 11/13/2019   Tibialis anterior tendon tear, nontraumatic 11/13/2019   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   CPAP (continuous positive airway pressure) dependence 03/25/2018   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   Erectile dysfunction 06/20/2017   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 03/06/2017   Peripheral neuropathy 06/19/2016   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   Hearing loss 02/02/2016   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   Allergic rhinitis 02/02/2016   Atypical chest pain 11/24/2015   Preventative health care 11/24/2015   Onychomycosis 06/30/2015   Degenerative disc disease, lumbar 06/30/2015   Other allergic rhinitis 02/18/2015   Deviated nasal septum 02/18/2015   RLS (restless legs syndrome) 02/18/2015   GERD (gastroesophageal reflux disease) 09/18/2014   Hyperglycemia 03/03/2014   Rotator cuff tear 07/27/2013    Sleep apnea with use of continuous positive airway pressure (CPAP) 07/15/2013   Carotid stenosis 02/24/2013   Nonspecific abnormal electrocardiogram (ECG) (EKG) 01/06/2013   DJD (degenerative joint disease) of hip 12/24/2012   Lower GI bleed 12/12/2012   Right groin pain 11/29/2012   Ventral hernia 07/08/2012   High risk medication use 01/18/2012   Dyspnea 06/12/2011   Hyperlipidemia with target LDL less than 130 12/27/2010   Osteoarthrosis, unspecified whether generalized or localized, pelvic region and thigh 07/25/2010   Osteoarthrosis, hand 06/13/2010  Anemia 12/17/2009   Hypothyroidism 05/13/2009   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   Benign essential hypertension 05/13/2009   Right inguinal hernia 05/13/2009   HIATAL HERNIA 05/13/2009   NEPHROLITHIASIS 05/13/2009   ROSACEA 05/13/2009    PCP: [name], NP  REFERRING PROVIDER: [name], NP  REFERRING DIAG: R26.81 (ICD-10-CM) - Gait instability   THERAPY DIAG:  Unsteadiness on feet  Other abnormalities of gait and mobility  Muscle weakness (generalized)  Repeated falls  Other low back pain  RATIONALE FOR EVALUATION AND TREATMENT:  Rehabilitation  ONSET DATE:  Fall 01/12/22 (2 falls in past 6 months)  SUBJECTIVE:   SUBJECTIVE STATEMENT: Pt reports tolerance for the HEP is limited at times due to his back and groin pain. He feels that some of his balance issues may be due to trying to hurry too much.  Pt accompanied by: self  PAIN:  Are you having pain? Yes: NPRS scale: 0/10 currently but upon getting out of bed typically 3/10 Pain location: B low back and R posterior hip/buttocks and groin Pain description: aching and throbbing Aggravating factors: prolonged sitting or standing Relieving factors: walking around  PERTINENT HISTORY:  Aortic atherosclerosis; carotid stenosis; cervical myelopathy s/p ACDF 06/2020 and posterior cervical fusion 11/2020; peripheral neuropathy; lumbar DDD/spondylosis; lumbago with  sciatica/lumbar radiculopathy; OA; B TKA; BTHA; L greater trochanteric pain syndrome; L RCR 2014; recent CTR surgery - early May; hearing loss; HTN; BPPV/vertigo; B sacroiliitis; skin cancer; COPD/emphysema; sleep apnea with CPAP; GERD; gout; HLD; LE cramps; RLS  PRECAUTIONS: None  WEIGHT BEARING RESTRICTIONS: No  FALLS: Has patient fallen in last 6 months? Yes. 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 No acute intracranial abnormality.  2. No acute fracture or traumatic subluxation of the cervical spine. L wrist x-ray: 1. Mild dorsal wrist soft tissue swelling with small ossific density overlying the proximal carpal row. Correlate for triquetral fracture.  2. Moderate scaphotrapeziotrapezoid and mild first CMC joint osteoarthritis. L elbow x-ray: 1. No acute fracture or dislocation, left elbow.  2. Mild elevation of the anterior fat pad suggestive of a small joint effusion, which may be reactive in the setting of elbow osteoarthritis. L shoulder x-rays: No acute osseous abnormality.  COGNITION: Overall cognitive status: Within functional limits for tasks assessed and Impaired: Memory: Impaired: Short term Procedural   SENSATION: Peripheral neuropathy in B  feet  COORDINATION: Mildly impaired with slow movements  EDEMA:  Mild to moderate edema in distal B LE  MUSCLE LENGTH: Hamstrings: mod tight R>L ITB: mod tight B Piriformis: mod tight L>R Hip flexors: mod tight B Quads: mod tight B Heelcord: mod tight B  POSTURE:  rounded shoulders, forward head, and flexed trunk   LOWER EXTREMITY MMT:   Tested in sitting on eval MMT Right  02/22/22 Left 02/22/22  Hip flexion 3+ 4-  Hip extension 3- 3-  Hip abduction 4- 4  Hip adduction 4 4  Hip internal rotation 3 3+  Hip external rotation 3+ 3+  Knee flexion 4- 4-  Knee extension 4- 4  Ankle dorsiflexion 4- 4  Ankle plantarflexion 3+  6 SLS HR 4 10 SLS HR  Ankle inversion 3+ 4-  Ankle eversion 3+ 4-  (Blank rows = not tested)  BED MOBILITY:  Sit to supine SBA Supine to sit SBA  TRANSFERS: Assistive device utilized: Single point cane and None  Sit to stand: Modified independence Stand to sit: Modified independence Chair to chair: Modified independence Floor:  NT  GAIT: Gait pattern: step through pattern, decreased stride length, decreased hip/knee flexion- Right, decreased hip/knee flexion- Left, decreased trunk rotation, and wide BOS Distance walked: 80 Assistive device utilized: Single point cane Level of assistance: SBA Comments: Gait speed: 2.10 ft/sec with SPC, 2.31 ft/sec w/o AD  RAMP: Level of Assistance:  NT Assistive device utilized:    Ramp Comments:   CURB:  Level of Assistance:  NT Assistive device utilized:    Curb Comments:   STAIRS:  Level of Assistance:  NT  Stair Negotiation Technique:   Number of Stairs:    Height of Stairs:   Comments:   FUNCTIONAL TESTS:  5 times sit to stand: 13.0 sec Timed up and go (TUG): 13.53 sec with SPC; 14.19 sec w/o AD 10 meter walk test: 15.60 sec with SPC; 13.04 sec w/o AD Berg Balance Scale: 39/56 - scores 37-45 indicate significant risk for falls (>80%) Dynamic Gait Index: 11/24; Scores of 19 or less are  predictive of falls in older community living adults  PATIENT SURVEYS:  ABC scale 55% of self confidence; 45% impairment  TODAY'S TREATMENT:   03/20/22 THERAPEUTIC EXERCISE: to improve flexibility, strength and mobility.  Verbal and tactile cues throughout for technique. Seated R/L hip adductor stretch 2 x 30 sec Standing hip flexor stretch (fwd lunge with foot on 9" stool) 2 x 30 sec Hooklying R/L KTOS piriformis stretch 2 x 30 sec Hooklying B frog leg/butterfly hip adductor stretch 2 x 30 sec Hooklying LTR 5 x 10 sec Hooklying alt LE bent-knee fallout 10 x 3"  SELF CARE: Reviewed the "Check for Safety - Home Fall Prevention Checklist for Older Adults" to help identify fall risk hazards in the home along with strategies to reduce fall risk at home   03/08/22 THERAPEUTIC EXERCISE: to improve flexibility, strength and mobility.  Verbal and tactile cues throughout for technique. NuStep L4 x 6 min Standing hip ABD 5 sec hold x 10 bil (pt reports difficulty with getting band around ankles due to neuropathy in hands) forward and backward step over 1/2 foam roller with counter support  x 10 bil turning in corner 360 x 3 ea way cues to pause between reps to avoid dizziness Tandem stance multiple reps; semi tandem x 30 sec bil.  SLS with one UE support bil multiple reps Sit to stand x 10 Squat touch to mat + foam pad x 10   02/24/22 Completed 10MWT/gait speed & DGI assessment THERAPEUTIC EXERCISE: to improve flexibility, strength and mobility.  Verbal and tactile cues throughout for technique. NuStep L4 x 6 min Seated B hamstring stretch 2 x 30 sec Seated B figure 4 piriformis stretch with foot resting on 9" stool in front and LE/hip ER 2 x 30 sec (modified as pt reports he is unable to bring his  ankle up to his opposite knee) Standing B gastroc stretch at counter  2 x 30 sec Standing B soleus stretch at counter  2 x 30 sec Standing B runner's position hip flexor stretch with leg crossed  behind other leg to add ITB stretch 2 x 30 sec  Verbally reviewed and discussed modifications of the following stretches and exercises from his prior HEP but not attempted today: - side stepping with resistance at ankles and counter support   - forward and backward step over with counter support   - turning in corner 360   - tandem stance in corner  (instead of at counter to reduce pressure on hand/wrist) - single leg stance with support   - squat with chair touch  (in place of kettle bell squat) - supine KTOS piriformis stretch with foot on ground  (in place of seated version) - supine lower trunk rotation   - bent knee fallouts with alternating legs     PATIENT EDUCATION: Education details: HEP update - hip flexor and adductor stretches; and Check for Safety - Home Fall Prevention Checklist for Older Adults  Person educated: Patient Education method: Explanation, Demonstration, Tactile cues, Verbal cues, and Handouts Education comprehension: verbalized understanding, returned demonstration, verbal cues required, tactile cues required, and needs further education   HOME EXERCISE PROGRAM: Access Code: [email or web address] URL: [email or web address] Date: 03/20/2022 Prepared by: Annie Paras  Exercises - Standing Gastroc Stretch at Counter  - 1-2 x daily - 7 x weekly - 2-3 reps - 30 sec hold - Standing Soleus Stretch at Counter  - 1-2 x daily - 7 x weekly - 2-3 reps - 30 sec hold - Hip Flexor Stretch on Step  - 1-2 x daily - 7 x weekly - 2-3 reps - 30 sec hold - Side Stepping with Resistance at Ankles and Counter Support  - 1 x daily - 7 x weekly - 2 sets - 10 reps - Forward and Backward Step Over with Counter Support  - 1 x daily - 7 x weekly - 2 sets - 10 reps - Turning in Corner 360  - 1 x daily - 7 x weekly - 2 sets - 5 reps - Tandem Stance in Corner  - 1 x daily - 7 x weekly - 2 reps - 30 sec hold - Single Leg Stance with Support  - 1 x daily - 7 x weekly - 2 sets - 30 sec  hold - Squat with Chair Touch  - 1 x daily - 7 x weekly - 2 sets - 10 reps - 3 sec hold - Seated Hamstring Stretch  - 1-2 x daily - 7 x weekly - 2 sets - 30 sec hold - Seated Hip Adductor Stretch  - 1-2 x daily - 7 x weekly - 3 reps - 30 sec hold - Seated Figure 4 Piriformis Stretch  - 1-2 x daily - 7 x weekly - 2 sets - 30 sec hold - Supine Piriformis Stretch with Foot on Ground  - 1-2 x daily - 7 x weekly - 2-3 reps - 30 sec hold - Supine Lower Trunk Rotation  - 1 x daily - 7 x weekly - 2 sets - 30 sec hold - Bent Knee Fallouts with Alternating Legs  - 1 x daily - 7 x weekly - 2 sets - 10 reps - 3 sec hold  Patient Education - Check for Safety   ASSESSMENT:  CLINICAL IMPRESSION: Jaymian "[name]" reports some limitation with HEP performance due to  LBP and radicular buttock and groin pain but denies any concerns with existing exercises. Completed HEP review for remaining exercises from prior HEP, adding hip flexor and adductor stretches to address groin pain. Education provided on fall risk prevention including a home safety checklist as well as clarification to maintain forward gaze while walking rather than looking down at floor. Will plan to progress lumbopelvic and LE strengthening as well as balance training in upcoming visits.  OBJECTIVE IMPAIRMENTS Abnormal gait, decreased activity tolerance, decreased balance, decreased endurance, decreased knowledge of condition, decreased knowledge of use of DME, decreased mobility, difficulty walking, decreased ROM, decreased strength, decreased safety awareness, increased fascial restrictions, impaired perceived functional ability, increased muscle spasms, impaired flexibility, improper body mechanics, postural dysfunction, and pain.   ACTIVITY LIMITATIONS carrying, lifting, bending, standing, squatting, stairs, transfers, and locomotion level  PARTICIPATION LIMITATIONS: cleaning, laundry, interpersonal relationship, community activity, and yard  work  PERSONAL FACTORS Age, Fitness, Past/current experiences, Time since onset of injury/illness/exacerbation, and 3+ comorbidities: Aortic atherosclerosis; carotid stenosis; cervical myelopathy s/p ACDF 06/2020 and posterior cervical fusion 11/2020; peripheral neuropathy; lumbar DDD/spondylosis; lumbago with sciatica/lumbar radiculopathy; OA; B TKA; BTHA; L greater trochanteric pain syndrome; L RCR 2014; recent CTR surgery - early May; hearing loss; HTN; BPPV/vertigo; B sacroiliitis; skin cancer; COPD/emphysema; sleep apnea with CPAP; GERD; gout; HLD; LE cramps; RLS  are also affecting patient's functional outcome.   REHAB POTENTIAL: Good  CLINICAL DECISION MAKING: Evolving/moderate complexity  EVALUATION COMPLEXITY: Moderate  GOALS: Goals reviewed with patient? Yes  SHORT TERM GOALS: Target date: 03/22/2022   Patient will be independent with initial HEP. Baseline:  Goal status: IN PROGRESS   2.  Patient will demonstrate decreased fall risk by scoring > 45/56 on Berg. Baseline: 39/56 Goal status: IN PROGRESS  3.  Patient will be educated on strategies to decrease risk of falls.  Baseline:  Goal status: IN PROGRESS   LONG TERM GOALS: Target date: 04/19/2022   Patient will be independent with advanced/ongoing HEP to improve outcomes and carryover.  Baseline:  Goal status: IN PROGRESS  2.  Patient will be able to ambulate 600' with LRAD with good safety to access community.  Baseline:  Goal status: IN PROGRESS  3.  Patient will be able to step up/down curb safely with LRAD for safety with community ambulation.  Baseline:  Goal status: IN PROGRESS   4.  Patient will demonstrate improved functional LE strength as demonstrated by overall MMT >/= 4 to 4+/5. Baseline:  Goal status: IN PROGRESS  5.  Patient will demonstrate at least 19/24 on DGI to improve gait stability and reduce risk for falls. Baseline: 11/24 Goal status: IN PROGRESS  6.  Patient will score >/= 52/56 on  Berg Balance test to demonstrate lower risk of falls. (MCID= 8 points) .  Baseline: 39/56 Goal status: IN PROGRESS  7.  Patient will report >67% of self confidence on ABC scale to demonstrate improved functional ability. Baseline: 55% self confidence; 45 % impairment Goal status: IN PROGRESS  8.  Patient will demonstrate gait speed of >/= 2.62 ft/sec (0.80 m/s) to be a safe community ambulator with decreased risk for recurrent falls.  Baseline: 2.10 ft/sec with SPC, 2.31 ft/sec w/o AD Goal status: IN PROGRESS   PLAN: PT FREQUENCY: 2x/week  PT DURATION: 8 weeks  PLANNED INTERVENTIONS: Therapeutic exercises, Therapeutic activity, Neuromuscular re-education, Balance training, Gait training, Patient/Family education, Joint mobilization, Stair training, Vestibular training, Canalith repositioning, Dry Needling, Electrical stimulation, Spinal manipulation, Cryotherapy, Moist heat, Taping, Ultrasound, Ionotophoresis  $'4mg'Q$ /ml Dexamethasone, Manual therapy, and Re-evaluation  PLAN FOR NEXT SESSION: STG assessment; HEP review as needed; progress lumbopelvic/LE flexibility [email or web address] strengthening; balance training   Percival Spanish, PT 03/20/2022, 12:11 PM

## 2022-03-22 ENCOUNTER — Ambulatory Visit: Payer: Medicare HMO | Admitting: Physical Therapy

## 2022-03-22 ENCOUNTER — Encounter: Payer: Self-pay | Admitting: Physical Therapy

## 2022-03-22 DIAGNOSIS — R2689 Other abnormalities of gait and mobility: Secondary | ICD-10-CM | POA: Diagnosis not present

## 2022-03-22 DIAGNOSIS — R2681 Unsteadiness on feet: Secondary | ICD-10-CM

## 2022-03-22 DIAGNOSIS — M5459 Other low back pain: Secondary | ICD-10-CM

## 2022-03-22 DIAGNOSIS — R296 Repeated falls: Secondary | ICD-10-CM

## 2022-03-22 DIAGNOSIS — M6281 Muscle weakness (generalized): Secondary | ICD-10-CM

## 2022-03-22 NOTE — Therapy (Signed)
OUTPATIENT PHYSICAL THERAPY TREATMENT   Patient Name: [name] Pratt MRN: [id number] DOB:1940-11-06, 81 y.o., male Today's Date: 03/22/2022      PT End of Session - 03/22/22 1010     Visit Number 5    Number of Visits 17    Date for PT Re-Evaluation 04/19/22    Authorization Type [name]    Authorization Time Period 02/24/22 - 04/19/22    Authorization - Visit Number 4    Authorization - Number of Visits 16    PT Start Time 1010    PT Stop Time 1102    PT Time Calculation (min) 52 min    Activity Tolerance Patient tolerated treatment well    Behavior During Therapy [facility] for tasks assessed/performed                 Past Medical History:  Diagnosis Date   Allergic rhinitis 02/02/2016   Anemia 12/17/2009   Formatting of this note might be different from the original. Anemia  10/1 IMO update   Aortic atherosclerosis ([place]) 03/05/2020   Arthritis    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   Atypical chest pain 11/24/2015   Benign essential hypertension 05/13/2009   Qualifier: Diagnosis of  By: Larwance Sachs of this note might be different from the original. Hypertension   Benign paroxysmal positional vertigo 09/14/2021   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[place]) 07/22/2021   Body mass index (BMI) 25.0-25.9, adult 11/02/2020   Cancer ([place])    skin cancer   Cardiac murmur 07/25/2021   Carotid stenosis    a. Carotid U/S 4/94: LICA < 49%, RICA 67-59%;  b.  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   Cystoid macular edema of both eyes 07/28/2020   Degenerative disc disease, lumbar 06/30/2015    Deviated nasal septum 02/18/2015   Diverticulosis 03/03/2020   DJD (degenerative joint disease) of hip 12/24/2012   Dyspnea 06/12/2011   Emphysema, unspecified ([place]) 03/05/2020   Erectile dysfunction 06/20/2017   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[place]) 02/09/2021   Gait disorder 03/02/2021   GERD (gastroesophageal reflux disease)    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   Qualifier: Diagnosis of  By: Burnett Kanaris     Greater trochanteric pain syndrome 07/22/2021   Hand pain, left 01/16/2022   Hearing loss 02/02/2016   Heme positive stool 02/29/2020   HIATAL HERNIA 05/13/2009   Qualifier: Diagnosis of  By: Burnett Kanaris     High risk medication use 01/18/2012   Hip pain 03/02/2021   History of chicken pox    History of COVID-19 12/05/2021   History of hemorrhoids    History of kidney stones    History of skin cancer 07/22/2021   skin cancer   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 Cardiology - Dr Frances Nickels at [facility] cardiology  ICD-10 cut over   Hypertension    under control   Hypokalemia 07/08/2021   Hypothyroidism    Internal  hemorrhoids 03/03/2020   Leg cramps 11/13/2019   Lower GI bleed 12/12/2012   Lumbago of lumbar region with sciatica 10/21/2020   Lumbar radiculopathy 03/02/2021   Lumbar spondylosis 11/02/2020   NEPHROLITHIASIS 05/13/2009   Qualifier: Diagnosis of  By: Burnett Kanaris     Nonspecific abnormal electrocardiogram (ECG) (EKG) 01/06/2013   Numbness of hand 01/24/2022   Onychomycosis 06/30/2015   Osteoarthrosis, 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 Osteoarthritis Of The Hand  10/1 IMO update   Osteoarthrosis, unspecified whether generalized or localized, pelvic region and thigh 07/25/2010   Formatting of  this note might be different from the original. Osteoarthritis Of The Hip   Other abnormal glucose 07/22/2021   Other allergic rhinitis 02/18/2015   Palpitations 07/25/2021   Peripheral neuropathy 06/19/2016   Preventative health care 11/24/2015   Right groin pain 11/29/2012   Right inguinal hernia 05/13/2009   Qualifier: Diagnosis of  By: Burnett Kanaris     RLS (restless legs syndrome) 02/18/2015   Rosacea    Rotator cuff tear 07/27/2013   Situational depression 03/02/2021   Skin lesion 01/16/2022   Sleep apnea with use of continuous positive airway pressure (CPAP) 07/15/2013   CPAP set to  7 cm water,  Residual AHi was 7.8  With no leak.  User time 6 hours.  479 days , not used only 12 days - highly compliant 06-23-13  .    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   Urinary retention 11/26/2020   Ventral hernia 07/08/2012   Vertigo 07/08/2021   Weight loss 03/03/2020   Past Surgical History:  Procedure Laterality Date   ABDOMINAL HERNIA REPAIR  07/15/12   Dr [name]   ANTERIOR CERVICAL DECOMP/DISCECTOMY FUSION N/A 07/09/2020   Procedure: ANTERIOR CERVICAL DECOMPRESSION AND FUSION CERVICAL THREE-FOUR.;  Surgeon: [name], MD;  Location: [facility];  Service: Neurosurgery;  Laterality: N/A;  anterior   BROW LIFT  05/07/01   CYSTOSCOPY WITH URETEROSCOPY AND STENT PLACEMENT Right 01/28/2014   Procedure: CYSTOSCOPY WITH URETEROSCOPY, BASKET RETRIVAL AND  STENT PLACEMENT;  Surgeon: [name], MD;  Location: [facility];  Service: Urology;  Laterality: Right;   epidural injections     multiple procedures   ESOPHAGEAL DILATION  1993 and 1994   multiple times   EYE  SURGERY Bilateral 03/25/10, 2012   cataract removal   HEMORRHOID SURGERY  2014   HIATAL HERNIA REPAIR  12/21/93   HOLMIUM LASER APPLICATION Right 06/03/3006   Procedure: HOLMIUM LASER APPLICATION;  Surgeon: [name], MD;  Location: [facility];  Service: Urology;  Laterality: Right;   INGUINAL HERNIA REPAIR Right 07/15/12   Dr [name], x2   JOINT REPLACEMENT  07/25/04   right knee   JOINT REPLACEMENT  07/13/10   left knee   [place]  09/20/06   with intraoperative cholangiogram and right inguinal herniorrhaphy with mesh    LIGAMENT REPAIR Left 07/2013   shoulder   LITHOTRIPSY     x2   [place] FUSION/FORAMINOTOMY N/A 11/24/2020   Procedure: Posterior Cervical Laminectomy Cervical three-four,  Cervical four-five with lateral mass fusion;  Surgeon: [name], MD;  Location: [facility];  Service: Neurosurgery;  Laterality: N/A;   REFRACTIVE SURGERY Bilateral    Right total hip replacement  4/14   SKIN CANCER DESTRUCTION     nose, ear   TONSILLECTOMY  as child   TOTAL HIP ARTHROPLASTY Left    URETHRAL DILATION  1991   [name]   Patient Active Problem List   Diagnosis Date Noted   Irregular heart beat 02/03/2022   Constipation 02/03/2022   Carpal tunnel syndrome 01/24/2022   Numbness of hand 01/24/2022   Ulnar neuropathy 01/24/2022   Skin lesion 01/16/2022   Hand pain, left 01/16/2022   History of COVID-19 12/05/2021   Benign paroxysmal positional vertigo 09/14/2021   Palpitations 07/25/2021   Cardiac murmur 07/25/2021   History of chicken pox 07/22/2021   History of hemorrhoids 07/22/2021   Hypertension 07/22/2021   COPD (chronic obstructive pulmonary disease) ([place]) 07/22/2021   History of kidney stones 07/22/2021   History of skin cancer 04/59/9774   Complication of anesthesia 07/22/2021   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[place]) 07/22/2021   Other abnormal glucose 07/22/2021   Spondylitis ([place]) 07/22/2021    Greater trochanteric pain syndrome 07/22/2021   Vertigo 07/08/2021   Hypokalemia 07/08/2021   Trochanteric bursitis of left hip 06/30/2021   Cancer ([place]) 04/11/2021   Lumbar radiculopathy 03/02/2021   Gait instability 03/02/2021   Hip pain 03/02/2021   Situational depression 03/02/2021   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[place]) 02/09/2021   Urinary retention 11/26/2020   Cervical myelopathy ([place]) 11/24/2020   Body mass index (BMI) 25.0-25.9, adult 11/02/2020   Lumbar spondylosis 11/02/2020   Lumbago of lumbar region with sciatica 10/21/2020   Cystoid macular edema of both eyes 07/28/2020   Status post cervical spinal fusion 07/09/2020   History of total bilateral knee replacement 07/08/2020   Aortic atherosclerosis ([place]) 03/05/2020   Emphysema, unspecified ([place]) 03/05/2020   Arthritis 03/04/2020   Diverticulosis 03/03/2020   Internal hemorrhoids 03/03/2020   Weight loss 03/03/2020   Heme positive stool 02/29/2020   Leg cramps 11/13/2019   Tibialis anterior tendon tear, nontraumatic 11/13/2019   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   CPAP (continuous positive airway pressure) dependence 03/25/2018   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   Erectile dysfunction 06/20/2017   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 03/06/2017   Peripheral neuropathy 06/19/2016   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   Hearing loss 02/02/2016   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   Allergic rhinitis 02/02/2016   Atypical chest pain 11/24/2015   Preventative health care 11/24/2015   Onychomycosis 06/30/2015   Degenerative disc disease, lumbar 06/30/2015   Other allergic rhinitis 02/18/2015   Deviated nasal septum 02/18/2015   RLS (restless legs syndrome) 02/18/2015   GERD (gastroesophageal reflux disease) 09/18/2014   Hyperglycemia 03/03/2014   Rotator cuff tear 07/27/2013    Sleep apnea with use of continuous positive airway pressure (CPAP) 07/15/2013   Carotid stenosis 02/24/2013   Nonspecific abnormal electrocardiogram (ECG) (EKG) 01/06/2013   DJD (degenerative joint disease) of hip 12/24/2012   Lower GI bleed 12/12/2012   Right groin pain 11/29/2012   Ventral hernia 07/08/2012   High risk medication use 01/18/2012   Dyspnea 06/12/2011   Hyperlipidemia with target LDL less than 130 12/27/2010   Osteoarthrosis, unspecified whether generalized or localized, pelvic region and thigh 07/25/2010   Osteoarthrosis, hand 06/13/2010  Anemia 12/17/2009   Hypothyroidism 05/13/2009   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   Benign essential hypertension 05/13/2009   Right inguinal hernia 05/13/2009   HIATAL HERNIA 05/13/2009   NEPHROLITHIASIS 05/13/2009   ROSACEA 05/13/2009    PCP: [name], NP  REFERRING PROVIDER: [name], NP  REFERRING DIAG: R26.81 (ICD-10-CM) - Gait instability   THERAPY DIAG:  Unsteadiness on feet  Other abnormalities of gait and mobility  Muscle weakness (generalized)  Repeated falls  Other low back pain  RATIONALE FOR EVALUATION AND TREATMENT:  Rehabilitation  ONSET DATE:  Fall 01/12/22 (2 falls in past 6 months)  SUBJECTIVE:   SUBJECTIVE STATEMENT: Pt notes increased pain/soreness after working on the stretches last visit.  Pt accompanied by: self  PAIN:  Are you having pain? Yes: NPRS scale:  6/10 Pain location: B low back and R posterior hip/buttocks and groin Pain description: aching and throbbing Aggravating factors: prolonged sitting or standing Relieving factors: walking around  PERTINENT HISTORY:  Aortic atherosclerosis; carotid stenosis; cervical myelopathy s/p ACDF 06/2020 and posterior cervical fusion 11/2020; peripheral neuropathy; lumbar DDD/spondylosis; lumbago with sciatica/lumbar radiculopathy; OA; B TKA; BTHA; L greater trochanteric pain syndrome; L RCR 2014; recent CTR surgery - early May; hearing  loss; HTN; BPPV/vertigo; B sacroiliitis; skin cancer; COPD/emphysema; sleep apnea with CPAP; GERD; gout; HLD; LE cramps; RLS  PRECAUTIONS: None  WEIGHT BEARING RESTRICTIONS: No  FALLS: Has patient fallen in last 6 months? Yes. 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 No acute intracranial abnormality.  2. No acute fracture or traumatic subluxation of the cervical spine. L wrist x-ray: 1. Mild dorsal wrist soft tissue swelling with small ossific density overlying the proximal carpal row. Correlate for triquetral fracture.  2. Moderate scaphotrapeziotrapezoid and mild first CMC joint osteoarthritis. L elbow x-ray: 1. No acute fracture or dislocation, left elbow.  2. Mild elevation of the anterior fat pad suggestive of a small joint effusion, which may be reactive in the setting of elbow osteoarthritis. L shoulder x-rays: No acute osseous abnormality.  COGNITION: Overall cognitive status: Within functional limits for tasks assessed and Impaired: Memory: Impaired: Short term Procedural   SENSATION: Peripheral neuropathy in B feet  COORDINATION: Mildly impaired with slow movements  EDEMA:  Mild to moderate edema in distal B LE  MUSCLE LENGTH: Hamstrings: mod tight  R>L ITB: mod tight B Piriformis: mod tight L>R Hip flexors: mod tight B Quads: mod tight B Heelcord: mod tight B  POSTURE:  rounded shoulders, forward head, and flexed trunk   LOWER EXTREMITY MMT:   Tested in sitting on eval 02/22/22& 03/22/22 MMT Right 02/22/22 Left 02/22/22 Right 03/22/22 Left 03/22/22  Hip flexion 3+ 4- 3+ 4-  Hip extension 3- 3- 3+ 3+  Hip abduction 4- 4 4 4+  Hip adduction _0 4+  Hip internal rotation 3 3+ 3 3+  Hip external rotation 3+ 3+ 3+ 3+  Knee flexion 4- 4- 4 4  Knee extension 4- 4 4 4+  Ankle dorsiflexion 4- 4 4- 4  Ankle plantarflexion 3+  6 SLS HR 4 10 SLS HR 4+ 20 SLS HR Limited lift on last few reps 5 20 SLS HR  Ankle inversion 3+ 4- 3+ 4-  Ankle eversion 3+ 4- 3+ 4-  (Blank rows = not tested)  BED MOBILITY:  Sit to supine SBA Supine to sit SBA  TRANSFERS: Assistive device utilized: Single point cane and None  Sit to stand: Modified independence Stand to sit: Modified independence Chair to chair: Modified independence Floor:  NT  GAIT: Gait pattern: step through pattern, decreased stride length, decreased hip/knee flexion- Right, decreased hip/knee flexion- Left, decreased trunk rotation, and wide BOS Distance walked: 80 Assistive device utilized: Single point cane Level of assistance: SBA Comments: Gait speed: 2.10 ft/sec with SPC, 2.31 ft/sec w/o AD  RAMP: Level of Assistance:  NT Assistive device utilized:    Ramp Comments:   CURB:  Level of Assistance:  NT Assistive device utilized:    Curb Comments:   STAIRS:  Level of Assistance:  NT  Stair Negotiation Technique:   Number of Stairs:    Height of Stairs:   Comments:   FUNCTIONAL TESTS:  5 times sit to stand: 13.0 sec Timed up and go (TUG): 13.53 sec with SPC; 14.19 sec w/o AD 10 meter walk test: 15.60 sec with SPC; 13.04 sec w/o AD Berg Balance Scale: 39/56 - scores 37-45 indicate significant risk for falls (>80%) Dynamic Gait Index: 11/24; Scores of 19  or less are predictive of falls in older community living adults  PATIENT SURVEYS:  ABC scale 55% of self confidence; 45% impairment  TODAY'S TREATMENT:   03/22/22 THERAPEUTIC EXERCISE: to improve flexibility, strength and mobility.  Verbal and tactile cues throughout for technique. NuStep L5 x 6 min Seated B YTB clam 10 x 3", 2 sets (band modified with loops at ends, wrapped around lower things and crossed on top with loops anchored around wrists) Seated R/L unilateral YTB clam 10 x 3", 2 sets Seated YTB march 2 x 10 Seated TrA + YTB rows 10 x 3", 2 sets  THERAPEUTIC ACTIVITIES: STG assessment: Berg 43/56 LE MMT   03/20/22 THERAPEUTIC EXERCISE: to improve flexibility, strength and mobility.  Verbal and tactile cues throughout for technique. Seated R/L hip adductor stretch 2 x 30 sec Standing hip flexor stretch (fwd lunge with foot on 9" stool) 2 x 30 sec Hooklying R/L KTOS piriformis stretch 2 x 30 sec Hooklying B frog leg/butterfly hip adductor stretch 2 x 30 sec Hooklying LTR 5 x 10 sec Hooklying alt LE bent-knee fallout 10 x 3"  SELF CARE: Reviewed the "Check for Safety - Home Fall Prevention Checklist for Older Adults" to help identify fall risk hazards in the home along with strategies to reduce fall risk at home   03/08/22 THERAPEUTIC EXERCISE: to improve flexibility, strength and mobility.  Verbal and tactile cues throughout for technique. NuStep L4 x 6 min Standing hip ABD 5 sec hold x 10 bil (pt reports difficulty with getting band around ankles due to neuropathy in hands) forward and backward step over 1/2 foam roller with counter support  x 10 bil turning in corner 360 x 3 ea way cues to pause between reps to avoid dizziness Tandem stance multiple reps;  semi tandem x 30 sec bil.  SLS with one UE support bil multiple reps Sit to stand x 10 Squat touch to mat + foam pad x 10   PATIENT EDUCATION: Education details: HEP update and HEP modification - transitioned  YTB resisted bent knee fall out to sitting with modified band + added seated YTB march and rows with modified band Person educated: Patient Education method: Explanation, Demonstration, Tactile cues, Verbal cues, and Handouts Education comprehension: verbalized understanding, returned demonstration, verbal cues required, tactile cues required, and needs further education   HOME EXERCISE PROGRAM: Access Code: [email or web address] URL: [email or web address] Date: 03/22/2022 Prepared by: Annie Paras  Exercises - Standing Gastroc Stretch at Counter  - 1-2 x daily - 7 x weekly - 2-3 reps - 30 sec hold - Standing Soleus Stretch at Counter  - 1-2 x daily - 7 x weekly - 2-3 reps - 30 sec hold - Hip Flexor Stretch on Step  - 1-2 x daily - 7 x weekly - 2-3 reps - 30 sec hold - Side Stepping with Resistance at Ankles and Counter Support  - 1 x daily - 7 x weekly - 2 sets - 10 reps - Forward and Backward Step Over with Counter Support  - 1 x daily - 7 x weekly - 2 sets - 10 reps - Turning in Corner 360  - 1 x daily - 7 x weekly - 2 sets - 5 reps - Tandem Stance in Corner  - 1 x daily - 7 x weekly - 2 reps - 30 sec hold - Single Leg Stance with Support  - 1 x daily - 7 x weekly - 2 sets - 30 sec hold - Squat with Chair Touch  - 1 x daily - 7 x weekly - 2 sets - 10 reps - 3 sec hold - Seated Hamstring Stretch  - 1-2 x daily - 7 x weekly - 2 sets - 30 sec hold - Seated Hip Adductor Stretch  - 1-2 x daily - 7 x weekly - 3 reps - 30 sec hold - Seated Figure 4 Piriformis Stretch  - 1-2 x daily - 7 x weekly - 2 sets - 30 sec hold - Supine Piriformis Stretch with Foot on Ground  - 1-2 x daily - 7 x weekly - 2-3 reps - 30 sec hold - Supine Lower Trunk Rotation  - 1 x daily - 7 x weekly - 2 sets - 30 sec hold - Bent Knee Fallouts with Alternating Legs  - 1 x daily - 7 x weekly - 2 sets - 10 reps - 3 sec hold - Seated Hip Abduction with Resistance  - 1 x daily - 3-4 x weekly - 2 sets - 10 reps - 3-5 sec  hold - Seated Single Leg Hip Abduction with Resistance  - 1 x daily - 3-4 x weekly - 2 sets - 10 reps - 3 sec hold - Seated March with Resistance  - 1 x daily - 3-4 x weekly - 2 sets - 10 reps - 3 sec hold - Seated Shoulder Row with Anchored Resistance  - 1 x daily - 3-4 x weekly - 2 sets - 10 reps - 3-5 hold hold  Patient Education - Check for Safety   ASSESSMENT:  CLINICAL IMPRESSION: Zyheir "[name]" reports increased low back pain/soreness following the review of the HEP stretches last visit - cautioned him to avoid pushing stretches into painful motion. Only other concern noted with HEP was related to difficulty placing  the TB loop around his feet to bring it up to his thighs for some of the LE exercises. Exercises transitioned to seated position with modified band set-up to longer piece of YTB with band with loops at ends, wrapped around lower things and crossed on top with loops anchored around wrists due to limited grip. Pt noting better tolerance with these modifications - STG #1 met. Berg reassessed with score improved from 39/56 to 43/56 indicating progress but not quite to level anticipated for STG #2. [name] reports no concerns with the home safety checklist to reduce fall risk, noting the only issue identified in his home was lack of non-skid backing on his bathroom rugs but he just pushes the rugs out of the way next to the toilet to reduce slip/fall hazard - STG #3 met. [name] is progressing well with PT thus far and continues to have good potential to benefit from skilled PT to address remaining pain, strength, mobility, balance and gait deficits.  OBJECTIVE IMPAIRMENTS Abnormal gait, decreased activity tolerance, decreased balance, decreased endurance, decreased knowledge of condition, decreased knowledge of use of DME, decreased mobility, difficulty walking, decreased ROM, decreased strength, decreased safety awareness, increased fascial restrictions, impaired perceived functional  ability, increased muscle spasms, impaired flexibility, improper body mechanics, postural dysfunction, and pain.   ACTIVITY LIMITATIONS carrying, lifting, bending, standing, squatting, stairs, transfers, and locomotion level  PARTICIPATION LIMITATIONS: cleaning, laundry, interpersonal relationship, community activity, and yard work  PERSONAL FACTORS Age, Fitness, Past/current experiences, Time since onset of injury/illness/exacerbation, and 3+ comorbidities: Aortic atherosclerosis; carotid stenosis; cervical myelopathy s/p ACDF 06/2020 and posterior cervical fusion 11/2020; peripheral neuropathy; lumbar DDD/spondylosis; lumbago with sciatica/lumbar radiculopathy; OA; B TKA; BTHA; L greater trochanteric pain syndrome; L RCR 2014; recent CTR surgery - early May; hearing loss; HTN; BPPV/vertigo; B sacroiliitis; skin cancer; COPD/emphysema; sleep apnea with CPAP; GERD; gout; HLD; LE cramps; RLS  are also affecting patient's functional outcome.   REHAB POTENTIAL: Good  CLINICAL DECISION MAKING: Evolving/moderate complexity  EVALUATION COMPLEXITY: Moderate  GOALS: Goals reviewed with patient? Yes  SHORT TERM GOALS: Target date: 03/22/2022   Patient will be independent with initial HEP. Baseline:  Goal status: MET  03/22/22  2.  Patient will demonstrate decreased fall risk by scoring > 45/56 on Berg. Baseline: 39/56 Goal status: IN PROGRESS  03/22/22 - Progressed to 43/56  3.  Patient will be educated on strategies to decrease risk of falls.  Baseline:  Goal status: MET  03/22/22 - Education provided last visit - Pt reports no concerns as he went through the home checklist other than the bathroom mat non-skid backing has disintegrated from repeated washing (he states he pushes it to the side while he is in the BR)   LONG TERM GOALS: Target date: 04/19/2022   Patient will be independent with advanced/ongoing HEP to improve outcomes and carryover.  Baseline:  Goal status: IN PROGRESS  2.   Patient will be able to ambulate 600' with LRAD with good safety to access community.  Baseline:  Goal status: IN PROGRESS  3.  Patient will be able to step up/down curb safely with LRAD for safety with community ambulation.  Baseline:  Goal status: IN PROGRESS   4.  Patient will demonstrate improved functional LE strength as demonstrated by overall MMT >/= 4 to 4+/5. Baseline:  Goal status: IN PROGRESS  03/22/22 - Progressing (refer to MMT chart)  5.  Patient will demonstrate at least 19/24 on DGI to improve gait stability and reduce risk for falls.  Baseline: 11/24 Goal status: IN PROGRESS  6.  Patient will score >/= 52/56 on Berg Balance test to demonstrate lower risk of falls. (MCID= 8 points) .  Baseline: 39/56 Goal status: IN PROGRESS  7.  Patient will report >67% of self confidence on ABC scale to demonstrate improved functional ability. Baseline: 55% self confidence; 45 % impairment Goal status: IN PROGRESS  8.  Patient will demonstrate gait speed of >/= 2.62 ft/sec (0.80 m/s) to be a safe community ambulator with decreased risk for recurrent falls.  Baseline: 2.10 ft/sec with SPC, 2.31 ft/sec w/o AD Goal status: IN PROGRESS   PLAN: PT FREQUENCY: 2x/week  PT DURATION: 8 weeks  PLANNED INTERVENTIONS: Therapeutic exercises, Therapeutic activity, Neuromuscular re-education, Balance training, Gait training, Patient/Family education, Joint mobilization, Stair training, Vestibular training, Canalith repositioning, Dry Needling, Electrical stimulation, Spinal manipulation, Cryotherapy, Moist heat, Taping, Ultrasound, Ionotophoresis 2m/ml Dexamethasone, Manual therapy, and Re-evaluation  PLAN FOR NEXT SESSION:  progress lumbopelvic/LE flexibility and strengthening; balance training; HEP review/update/modification as needed   JPercival Spanish PT 03/22/2022, 11:47 AM

## 2022-03-27 ENCOUNTER — Ambulatory Visit (INDEPENDENT_AMBULATORY_CARE_PROVIDER_SITE_OTHER): Payer: Medicare HMO | Admitting: Ophthalmology

## 2022-03-27 ENCOUNTER — Encounter (INDEPENDENT_AMBULATORY_CARE_PROVIDER_SITE_OTHER): Payer: Self-pay | Admitting: Ophthalmology

## 2022-03-27 ENCOUNTER — Encounter (INDEPENDENT_AMBULATORY_CARE_PROVIDER_SITE_OTHER): Payer: Medicare HMO | Admitting: Ophthalmology

## 2022-03-27 DIAGNOSIS — H35073 Retinal telangiectasis, bilateral: Secondary | ICD-10-CM

## 2022-03-27 DIAGNOSIS — H353211 Exudative age-related macular degeneration, right eye, with active choroidal neovascularization: Secondary | ICD-10-CM | POA: Diagnosis not present

## 2022-03-27 MED ORDER — BEVACIZUMAB 2.5 MG/0.1ML IZ SOSY
2.5000 mg | PREFILLED_SYRINGE | INTRAVITREAL | Status: AC | PRN
  Administered 2022-03-27: 2.5 mg via INTRAVITREAL

## 2022-03-27 NOTE — Assessment & Plan Note (Signed)
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

## 2022-03-27 NOTE — Assessment & Plan Note (Signed)
Stable OU 

## 2022-03-27 NOTE — Progress Notes (Signed)
03/27/2022     CHIEF COMPLAINT Patient presents for No chief complaint on file.     HISTORY OF PRESENT ILLNESS: [name] Pratt is a 81 y.o. male who presents to the clinic today for:   HPI   8 weeks dilate OU, Avastin OCT, OD Pt states his vision has not been stable Pt denies any new floaters or FOL  Last edited by Morene Rankins, CMA on 03/27/2022 10:11 AM.      Referring physician: [name], NP [address] [place],  [place] [address]  HISTORICAL INFORMATION:   Selected notes from the MEDICAL RECORD NUMBER    Lab Results  Component Value Date   HGBA1C 5.5 03/07/2022     CURRENT MEDICATIONS: Current Outpatient Medications (Ophthalmic Drugs)  Medication Sig   cycloSPORINE (RESTASIS) 0.05 % ophthalmic emulsion Place 1 drop into both eyes 2 (two) times daily.   No current facility-administered medications for this visit. (Ophthalmic Drugs)   Current Outpatient Medications (Other)  Medication Sig   acetaminophen (TYLENOL) 650 MG CR tablet Take 1,300 mg by mouth every morning. And take 650 mg at bedtime   albuterol (VENTOLIN HFA) 108 (90 Base) MCG/ACT inhaler INHALE 2 PUFFS INTO THE LUNGS EVERY 6 HOURS AS NEEDED FOR SHORTNESS OF BREATH.   Alcohol Swabs (ALCOHOL PADS) 70 % PADS 1 packet by Does not apply route 2 (two) times daily.   allopurinol (ZYLOPRIM) 100 MG tablet Take 100 mg by mouth 2 (two) times daily.   amLODipine (NORVASC) 5 MG tablet Take 1 tablet (5 mg total) by mouth daily.   ampicillin (PRINCIPEN) 500 MG capsule Take 1 capsule (500 mg total) by mouth 4 (four) times daily.   aspirin EC 81 MG tablet Take 81 mg by mouth every morning.   Cholecalciferol (VITAMIN D) 50 MCG (2000 UT) CAPS Take 2,000 Units by mouth daily.   colchicine 0.6 MG tablet Take 0.6 mg by mouth as needed for other. For gout   diclofenac Sodium (VOLTAREN) 1 % GEL Apply 1 application topically daily as needed for pain (Hand pain).   fluocinonide (LIDEX) 0.05  % external solution Apply 1 application topically 2 (two) times daily as needed for itching or pain.   gabapentin (NEURONTIN) 300 MG capsule TAKE 1 CAPSULE TWO TO THREE TIMES DAILY AS NEEDED   GEMTESA 75 MG TABS Take 1 tablet by mouth daily.   glucose blood test strip TRUE Metrix Blood Glucose Test Strip, use as instructed   Homeopathic Products (THERAWORX RELIEF) FOAM Apply 1 application topically 2 (two) times daily as needed (Pain).   hydrALAZINE (APRESOLINE) 50 MG tablet Take 1 tablet (50 mg total) by mouth 2 (two) times daily.   Lancets MISC 1 each by Does not apply route 2 (two) times daily. TRUE matrix lancets   levothyroxine (SYNTHROID) 150 MCG tablet Take 150 mcg 6 days a week (patient takes 75 mcg once a week) Disp #84 for 90 day supply   levothyroxine (SYNTHROID) 75 MCG tablet Take 75 mcg once a week (patient on 150 mcg 6 days a week) Dispense #24 for 90 day supply.   losartan (COZAAR) 100 MG tablet TAKE 1 TABLET EVERY DAY   Magnesium 500 MG TABS Take 500 mg by mouth daily.   meclizine (ANTIVERT) 25 MG tablet Take 1 tablet (25 mg total) by mouth 3 (three) times daily as needed for dizziness.   multivitamin (THERAGRAN) per tablet Take 1 tablet by mouth daily.   nitroGLYCERIN (NITROSTAT)  0.4 MG SL tablet Place 1 tablet (0.4 mg total) under the tongue every 5 (five) minutes as needed for chest pain.   ondansetron (ZOFRAN-ODT) 4 MG disintegrating tablet Take 1 tablet (4 mg total) by mouth every 8 (eight) hours as needed for nausea or vomiting.   polyethylene glycol (MIRALAX / GLYCOLAX) 17 g packet Take 17-34 g by mouth daily as needed for constipation.   rosuvastatin (CRESTOR) 5 MG tablet TAKE 1 TABLET EVERY DAY   sodium chloride (OCEAN) 0.65 % nasal spray Place 1 spray into both nostrils at bedtime as needed for congestion.   traMADol (ULTRAM) 50 MG tablet Take 1 tablet (50 mg total) by mouth every 12 (twelve) hours as needed.   No current facility-administered medications for this  visit. (Other)      REVIEW OF SYSTEMS: ROS   Negative for: Constitutional, Gastrointestinal, Neurological, Skin, Genitourinary, Musculoskeletal, HENT, Endocrine, Cardiovascular, Eyes, Respiratory, Psychiatric, Allergic/Imm, Heme/Lymph Last edited by Orene Desanctis D, CMA on 03/27/2022 10:11 AM.       ALLERGIES Allergies  Allergen Reactions   Pravastatin Other (See Comments)    Muscle cramps   Sildenafil Other (See Comments)    Tachycardia     Oxycodone     hallucinations   Atenolol Other (See Comments)    bradycardia   Elastic Bandages & [Zinc] Other (See Comments)    Teflon bandages - Irritation   Metoclopramide Hcl Anxiety    PAST MEDICAL HISTORY Past Medical History:  Diagnosis Date   Allergic rhinitis 02/02/2016   Anemia 12/17/2009   Formatting of this note might be different from the original. Anemia  10/1 IMO update   Aortic atherosclerosis ([place]) 03/05/2020   Arthritis    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   Atypical chest pain 11/24/2015   Benign essential hypertension 05/13/2009   Qualifier: Diagnosis of  By: Larwance Sachs of this note might be different from the original. Hypertension   Benign paroxysmal positional vertigo 09/14/2021   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[place]) 07/22/2021   Body mass index (BMI) 25.0-25.9, adult 11/02/2020   Cancer (HCC)    skin cancer   Cardiac murmur 07/25/2021   Carotid stenosis    a. Carotid U/S 7/06: LICA < 23%, RICA 76-28%;  b.  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   Cystoid macular edema of both eyes 07/28/2020   Degenerative disc disease,  lumbar 06/30/2015   Deviated nasal septum 02/18/2015   Diverticulosis 03/03/2020   DJD (degenerative joint disease) of hip 12/24/2012   Dyspnea 06/12/2011   Emphysema, unspecified ([place]) 03/05/2020   Erectile dysfunction 06/20/2017   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[place]) 02/09/2021   Gait disorder 03/02/2021   GERD (gastroesophageal reflux disease)    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   Qualifier: Diagnosis of  By: Burnett Kanaris     Greater trochanteric pain syndrome 07/22/2021   Hand pain, left 01/16/2022   Hearing loss 02/02/2016   Heme positive stool 02/29/2020   HIATAL HERNIA 05/13/2009   Qualifier: Diagnosis of  By: Burnett Kanaris     High risk medication use 01/18/2012  Hip pain 03/02/2021   History of chicken pox    History of COVID-19 12/05/2021   History of hemorrhoids    History of kidney stones    History of skin cancer 07/22/2021   skin cancer   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 Osteoarthritis Of The Hand  10/1 IMO update   Osteoarthrosis, unspecified whether generalized or localized, pelvic region and thigh  07/25/2010   Formatting of this note might be different from the original. Osteoarthritis Of The Hip   Other abnormal glucose 07/22/2021   Other allergic rhinitis 02/18/2015   Palpitations 07/25/2021   Peripheral neuropathy 06/19/2016   Preventative health care 11/24/2015   Right groin pain 11/29/2012   Right inguinal hernia 05/13/2009   Qualifier: Diagnosis of  By: Burnett Kanaris     RLS (restless legs syndrome) 02/18/2015   Rosacea    Rotator cuff tear 07/27/2013   Situational depression 03/02/2021   Skin lesion 01/16/2022   Sleep apnea with use of continuous positive airway pressure (CPAP) 07/15/2013   CPAP set to  7 cm water,  Residual AHi was 7.8  With no leak.  User time 6 hours.  479 days , not used only 12 days - highly compliant 06-23-13  .    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   Urinary retention 11/26/2020   Ventral hernia 07/08/2012   Vertigo 07/08/2021   Weight loss 03/03/2020   Past Surgical History:  Procedure Laterality Date   ABDOMINAL HERNIA REPAIR  07/15/12   Dr [name]   ANTERIOR CERVICAL DECOMP/DISCECTOMY FUSION N/A 07/09/2020   Procedure: ANTERIOR CERVICAL DECOMPRESSION AND FUSION CERVICAL THREE-FOUR.;  Surgeon: [name], MD;  Location: [facility];  Service: Neurosurgery;  Laterality: N/A;  anterior   BROW LIFT  05/07/01   CYSTOSCOPY WITH URETEROSCOPY AND STENT PLACEMENT Right 01/28/2014   Procedure: CYSTOSCOPY WITH URETEROSCOPY, BASKET RETRIVAL AND  STENT PLACEMENT;  Surgeon: [name], MD;  Location: [facility];  Service: Urology;  Laterality: Right;   epidural injections     multiple procedures   ESOPHAGEAL DILATION  1993 and  1994   multiple times   EYE SURGERY Bilateral 03/25/10, 2012   cataract removal   HEMORRHOID SURGERY  2014   HIATAL HERNIA REPAIR  12/21/93   HOLMIUM LASER APPLICATION Right 1/54/0086   Procedure: HOLMIUM LASER APPLICATION;  Surgeon: [name], MD;  Location: [facility];  Service: Urology;  Laterality: Right;   INGUINAL HERNIA REPAIR Right 07/15/12   Dr [name], x2   JOINT REPLACEMENT  07/25/04   right knee   JOINT REPLACEMENT  07/13/10   left knee   [place]  09/20/06   with intraoperative cholangiogram and right inguinal herniorrhaphy with mesh    LIGAMENT REPAIR Left 07/2013   shoulder   LITHOTRIPSY     x2   [place] FUSION/FORAMINOTOMY N/A 11/24/2020   Procedure: Posterior Cervical Laminectomy Cervical three-four, Cervical four-five with lateral mass fusion;  Surgeon: [name], MD;  Location: [facility];  Service: Neurosurgery;  Laterality: N/A;   REFRACTIVE SURGERY Bilateral    Right total hip replacement  4/14   SKIN CANCER DESTRUCTION     nose, ear   TONSILLECTOMY  as child   TOTAL HIP ARTHROPLASTY Left    URETHRAL DILATION  1991   [name]    FAMILY HISTORY Family History  Problem Relation Age of Onset   Cancer Mother    Hypertension Other    Cancer Other    Osteoarthritis Other     SOCIAL HISTORY Social History   Tobacco Use   Smoking status: Former    Years: 11.00    Types: Cigarettes    Quit date: 09/11/1978    Years since quitting: 43.5   Smokeless tobacco: Never  Vaping Use   Vaping Use: Never used  Substance Use Topics   Alcohol use: Not Currently    Alcohol/week: 0.0 standard drinks of alcohol    Comment: rare   Drug use: No         OPHTHALMIC EXAM:  Base Eye Exam     Visual Acuity (ETDRS)       Right Left   Dist [place] 20/60 20/30         Tonometry (Tonopen, 10:17 AM)       Right Left   Pressure 15 11         Neuro/Psych     Oriented x3: Yes    Mood/Affect: Normal         Dilation     Both eyes: 1.0% Mydriacyl, 2.5% Phenylephrine @ 10:12 AM           Slit Lamp and Fundus Exam     External Exam       Right Left   External Normal Normal         Slit Lamp Exam       Right Left   Lids/Lashes Normal Normal   Conjunctiva/Sclera White and quiet White and quiet   Cornea Clear Clear   Anterior Chamber Deep and quiet Deep and quiet   [place]ris PI PI   Lens Clear Clear   Anterior Vitreous Normal Normal         Fundus Exam       Right Left   Posterior Vitreous Posterior vitreous detachment    Disc Normal    C/D Ratio 0.5    Macula No overt CME, angulated vessels temporally, with thickening along the p.m. bundle.  Much less    Vessels Normal    Periphery Normal             IMAGING AND PROCEDURES  Imaging and Procedures for 03/27/22  OCT, Retina - OU - Both Eyes       Right Eye Quality was good. Scan locations included subfoveal. Central Foveal  Thickness: 227. Progression has improved. Findings include abnormal foveal contour, cystoid macular edema.   Left Eye Quality was good. Scan locations included subfoveal. Central Foveal Thickness: 222. Progression has been stable. Findings include abnormal foveal contour, cystoid macular edema.   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  We will repeat to control the peripapillary CNVM.  Chronic MAC-TEL continues OU     Intravitreal Injection, Pharmacologic Agent - OD - Right Eye       Time Out 03/27/2022. 11:03 AM. Confirmed correct patient, procedure, site, and patient consented.   Anesthesia Topical anesthesia was used. Anesthetic medications included Lidocaine 4%.   Procedure Preparation included 5% betadine to ocular surface, 10% betadine to eyelids, Tobramycin 0.3%. A 30 gauge needle was used.   Injection: 2.5 mg bevacizumab 2.5 MG/0.1ML   Route:  Intravitreal, Site: Right Eye   NDC: [phone], Lot: [id number], Expiration date: 05/12/2022   Post-op Post injection exam found visual acuity of at least counting fingers, no retinal detachment, perfused optic nerve. The patient tolerated the procedure well. There were no complications. The patient received written and verbal post procedure care education. Post injection medications included ocuflox.              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HCC)  H35.3211 OCT, Retina - OU - Both Eyes    Intravitreal Injection, Pharmacologic Agent - OD - Right Eye    bevacizumab (AVASTIN) SOSY 2.5 mg    2. 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  OD repeat injection today for peripapillary CNVM vastly improved macular anatomy and stable acuity on therapy.  Reevaluate again in 8 weeks  2.  3.  Ophthalmic Meds Ordered this visit:  Meds ordered this encounter  Medications   bevacizumab (AVASTIN) SOSY 2.5 mg       Return in about 8 weeks (around 05/22/2022) for dilate, OD, AVASTIN OCT.  There are no Patient Instructions on file for this visit.   Explained the diagnoses, plan, and follow up with the patient and they expressed understanding.  Patient expressed understanding of the importance of proper follow up care.   Clent Demark Kieley Akter M.D. Diseases & Surgery of the Retina and Vitreous Retina [facility] Diabetic [place] 03/27/22     Abbreviations: M myopia (nearsighted); A astigmatism; H hyperopia (farsighted); P presbyopia; Mrx spectacle prescription;  CTL contact lenses; OD right  eye; OS left eye; OU both eyes  XT exotropia; ET esotropia; PEK punctate epithelial keratitis; PEE punctate epithelial erosions; DES dry eye syndrome; MGD meibomian gland dysfunction; ATs artificial tears; PFAT's preservative free artificial tears; [place] nuclear sclerotic cataract; PSC posterior subcapsular cataract; ERM epi-retinal membrane; PVD posterior vitreous detachment; RD retinal detachment; DM diabetes mellitus; DR diabetic retinopathy; NPDR non-proliferative diabetic retinopathy; PDR proliferative diabetic retinopathy; CSME clinically significant macular edema; DME diabetic macular edema; dbh dot blot hemorrhages; CWS cotton wool spot; POAG primary open angle glaucoma; C/D cup-to-disc ratio; HVF humphrey visual field; GVF goldmann visual field; OCT optical coherence tomography; IOP intraocular pressure; BRVO Branch retinal vein  occlusion; CRVO central retinal vein occlusion; CRAO central retinal artery occlusion; BRAO branch retinal artery occlusion; RT retinal tear; SB scleral buckle; PPV pars plana vitrectomy; VH Vitreous hemorrhage; PRP panretinal laser photocoagulation; IVK intravitreal kenalog; VMT vitreomacular traction; MH Macular hole;  NVD neovascularization of the disc; NVE neovascularization elsewhere; AREDS age related eye disease study; ARMD age related macular degeneration; POAG primary open angle glaucoma; EBMD epithelial/anterior basement membrane dystrophy; ACIOL anterior chamber intraocular lens; IOL intraocular lens; PCIOL posterior chamber intraocular lens; Phaco/IOL phacoemulsification with intraocular lens placement; [place] photorefractive keratectomy; LASIK laser assisted in situ keratomileusis; HTN hypertension; DM diabetes mellitus; COPD chronic obstructive pulmonary disease

## 2022-03-28 ENCOUNTER — Ambulatory Visit: Payer: Medicare HMO

## 2022-03-28 DIAGNOSIS — R2681 Unsteadiness on feet: Secondary | ICD-10-CM | POA: Diagnosis not present

## 2022-03-28 DIAGNOSIS — M5459 Other low back pain: Secondary | ICD-10-CM | POA: Diagnosis not present

## 2022-03-28 DIAGNOSIS — M6281 Muscle weakness (generalized): Secondary | ICD-10-CM

## 2022-03-28 DIAGNOSIS — R2689 Other abnormalities of gait and mobility: Secondary | ICD-10-CM | POA: Diagnosis not present

## 2022-03-28 DIAGNOSIS — R296 Repeated falls: Secondary | ICD-10-CM | POA: Diagnosis not present

## 2022-03-28 NOTE — Therapy (Signed)
OUTPATIENT PHYSICAL THERAPY TREATMENT   Patient Name: [name] Pratt MRN: [id number] DOB:1940-11-26, 81 y.o., male Today's Date: 03/28/2022      PT End of Session - 03/28/22 1151     Visit Number 6    Number of Visits 17    Date for PT Re-Evaluation 04/19/22    Authorization Type [name]    Authorization Time Period 02/24/22 - 04/19/22    Authorization - Visit Number 5    Authorization - Number of Visits 16    PT Start Time 1057    PT Stop Time 1144    PT Time Calculation (min) 47 min    Activity Tolerance Patient tolerated treatment well    Behavior During Therapy [facility] for tasks assessed/performed                  Past Medical History:  Diagnosis Date   Allergic rhinitis 02/02/2016   Anemia 12/17/2009   Formatting of this note might be different from the original. Anemia  10/1 IMO update   Aortic atherosclerosis ([place]) 03/05/2020   Arthritis    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   Atypical chest pain 11/24/2015   Benign essential hypertension 05/13/2009   Qualifier: Diagnosis of  By: Larwance Sachs of this note might be different from the original. Hypertension   Benign paroxysmal positional vertigo 09/14/2021   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[place]) 07/22/2021   Body mass index (BMI) 25.0-25.9, adult 11/02/2020   Cancer ([place])    skin cancer   Cardiac murmur 07/25/2021   Carotid stenosis    a. Carotid U/S 2/67: LICA < 12%, RICA 45-80%;  b.  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   Cystoid macular edema of both eyes 07/28/2020   Degenerative disc disease, lumbar 06/30/2015    Deviated nasal septum 02/18/2015   Diverticulosis 03/03/2020   DJD (degenerative joint disease) of hip 12/24/2012   Dyspnea 06/12/2011   Emphysema, unspecified ([place]) 03/05/2020   Erectile dysfunction 06/20/2017   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[place]) 02/09/2021   Gait disorder 03/02/2021   GERD (gastroesophageal reflux disease)    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   Qualifier: Diagnosis of  By: Burnett Kanaris     Greater trochanteric pain syndrome 07/22/2021   Hand pain, left 01/16/2022   Hearing loss 02/02/2016   Heme positive stool 02/29/2020   HIATAL HERNIA 05/13/2009   Qualifier: Diagnosis of  By: Burnett Kanaris     High risk medication use 01/18/2012   Hip pain 03/02/2021   History of chicken pox    History of COVID-19 12/05/2021   History of hemorrhoids    History of kidney stones    History of skin cancer 07/22/2021   skin cancer   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 Cardiology - Dr Frances Nickels at [facility] cardiology  ICD-10 cut over   Hypertension    under control   Hypokalemia 07/08/2021   Hypothyroidism  Internal hemorrhoids 03/03/2020   Leg cramps 11/13/2019   Lower GI bleed 12/12/2012   Lumbago of lumbar region with sciatica 10/21/2020   Lumbar radiculopathy 03/02/2021   Lumbar spondylosis 11/02/2020   NEPHROLITHIASIS 05/13/2009   Qualifier: Diagnosis of  By: Burnett Kanaris     Nonspecific abnormal electrocardiogram (ECG) (EKG) 01/06/2013   Numbness of hand 01/24/2022   Onychomycosis 06/30/2015   Osteoarthrosis, 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 Osteoarthritis Of The Hand  10/1 IMO update   Osteoarthrosis, unspecified whether generalized or localized, pelvic region and thigh 07/25/2010   Formatting of  this note might be different from the original. Osteoarthritis Of The Hip   Other abnormal glucose 07/22/2021   Other allergic rhinitis 02/18/2015   Palpitations 07/25/2021   Peripheral neuropathy 06/19/2016   Preventative health care 11/24/2015   Right groin pain 11/29/2012   Right inguinal hernia 05/13/2009   Qualifier: Diagnosis of  By: Burnett Kanaris     RLS (restless legs syndrome) 02/18/2015   Rosacea    Rotator cuff tear 07/27/2013   Situational depression 03/02/2021   Skin lesion 01/16/2022   Sleep apnea with use of continuous positive airway pressure (CPAP) 07/15/2013   CPAP set to  7 cm water,  Residual AHi was 7.8  With no leak.  User time 6 hours.  479 days , not used only 12 days - highly compliant 06-23-13  .    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   Urinary retention 11/26/2020   Ventral hernia 07/08/2012   Vertigo 07/08/2021   Weight loss 03/03/2020   Past Surgical History:  Procedure Laterality Date   ABDOMINAL HERNIA REPAIR  07/15/12   Dr [name]   ANTERIOR CERVICAL DECOMP/DISCECTOMY FUSION N/A 07/09/2020   Procedure: ANTERIOR CERVICAL DECOMPRESSION AND FUSION CERVICAL THREE-FOUR.;  Surgeon: [name], MD;  Location: [facility];  Service: Neurosurgery;  Laterality: N/A;  anterior   BROW LIFT  05/07/01   CYSTOSCOPY WITH URETEROSCOPY AND STENT PLACEMENT Right 01/28/2014   Procedure: CYSTOSCOPY WITH URETEROSCOPY, BASKET RETRIVAL AND  STENT PLACEMENT;  Surgeon: [name], MD;  Location: [facility];  Service: Urology;  Laterality: Right;   epidural injections     multiple procedures   ESOPHAGEAL DILATION  1993 and 1994   multiple times   EYE  SURGERY Bilateral 03/25/10, 2012   cataract removal   HEMORRHOID SURGERY  2014   HIATAL HERNIA REPAIR  12/21/93   HOLMIUM LASER APPLICATION Right 02/10/931   Procedure: HOLMIUM LASER APPLICATION;  Surgeon: [name], MD;  Location: [facility];  Service: Urology;  Laterality: Right;   INGUINAL HERNIA REPAIR Right 07/15/12   Dr [name], x2   JOINT REPLACEMENT  07/25/04   right knee   JOINT REPLACEMENT  07/13/10   left knee   [place]  09/20/06   with intraoperative cholangiogram and right inguinal herniorrhaphy with mesh    LIGAMENT REPAIR Left 07/2013   shoulder   LITHOTRIPSY     x2   [place] FUSION/FORAMINOTOMY N/A 11/24/2020   Procedure: Posterior Cervical Laminectomy Cervical  three-four, Cervical four-five with lateral mass fusion;  Surgeon: [name], MD;  Location: [facility];  Service: Neurosurgery;  Laterality: N/A;   REFRACTIVE SURGERY Bilateral    Right total hip replacement  4/14   SKIN CANCER DESTRUCTION     nose, ear   TONSILLECTOMY  as child   TOTAL HIP ARTHROPLASTY Left    URETHRAL DILATION  1991   [name]   Patient Active Problem List   Diagnosis Date Noted   Irregular heart beat 02/03/2022   Constipation 02/03/2022   Carpal tunnel syndrome 01/24/2022   Numbness of hand 01/24/2022   Ulnar neuropathy 01/24/2022   Skin lesion 01/16/2022   Hand pain, left 01/16/2022   History of COVID-19 12/05/2021   Benign paroxysmal positional vertigo 09/14/2021   Palpitations 07/25/2021   Cardiac murmur 07/25/2021   History of chicken pox 07/22/2021   History of hemorrhoids 07/22/2021   Hypertension 07/22/2021   COPD (chronic obstructive pulmonary disease) ([place]) 07/22/2021   History of kidney stones 07/22/2021   History of skin cancer 63/84/5364   Complication of anesthesia 07/22/2021   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[place]) 07/22/2021   Other abnormal glucose 07/22/2021   Spondylitis ([place]) 07/22/2021    Greater trochanteric pain syndrome 07/22/2021   Vertigo 07/08/2021   Hypokalemia 07/08/2021   Trochanteric bursitis of left hip 06/30/2021   Cancer ([place]) 04/11/2021   Lumbar radiculopathy 03/02/2021   Gait instability 03/02/2021   Hip pain 03/02/2021   Situational depression 03/02/2021   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[place]) 02/09/2021   Urinary retention 11/26/2020   Cervical myelopathy ([place]) 11/24/2020   Body mass index (BMI) 25.0-25.9, adult 11/02/2020   Lumbar spondylosis 11/02/2020   Lumbago of lumbar region with sciatica 10/21/2020   Cystoid macular edema of both eyes 07/28/2020   Status post cervical spinal fusion 07/09/2020   History of total bilateral knee replacement 07/08/2020   Aortic atherosclerosis ([place]) 03/05/2020   Emphysema, unspecified ([place]) 03/05/2020   Arthritis 03/04/2020   Diverticulosis 03/03/2020   Internal hemorrhoids 03/03/2020   Weight loss 03/03/2020   Heme positive stool 02/29/2020   Leg cramps 11/13/2019   Tibialis anterior tendon tear, nontraumatic 11/13/2019   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 07/29/2018   CPAP (continuous positive airway pressure) dependence 03/25/2018   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   Erectile dysfunction 06/20/2017   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 03/06/2017   Peripheral neuropathy 06/19/2016   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   Hearing loss 02/02/2016   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   Allergic rhinitis 02/02/2016   Atypical chest pain 11/24/2015   Preventative health care 11/24/2015   Onychomycosis 06/30/2015   Degenerative disc disease, lumbar 06/30/2015   Other allergic rhinitis 02/18/2015   Deviated nasal septum 02/18/2015   RLS (restless legs syndrome) 02/18/2015   GERD (gastroesophageal reflux disease) 09/18/2014   Hyperglycemia 03/03/2014   Rotator cuff tear 07/27/2013    Sleep apnea with use of continuous positive airway pressure (CPAP) 07/15/2013   Carotid stenosis 02/24/2013   Nonspecific abnormal electrocardiogram (ECG) (EKG) 01/06/2013   DJD (degenerative joint disease) of hip 12/24/2012   Lower GI bleed 12/12/2012   Right groin pain 11/29/2012   Ventral hernia 07/08/2012   High risk medication use 01/18/2012   Dyspnea 06/12/2011   Hyperlipidemia with target LDL less than 130 12/27/2010   Osteoarthrosis, unspecified whether generalized or localized, pelvic region and thigh 07/25/2010   Osteoarthrosis, hand 06/13/2010  Anemia 12/17/2009   Hypothyroidism 05/13/2009   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   Benign essential hypertension 05/13/2009   Right inguinal hernia 05/13/2009   HIATAL HERNIA 05/13/2009   NEPHROLITHIASIS 05/13/2009   ROSACEA 05/13/2009    PCP: [name], NP  REFERRING PROVIDER: [name], NP  REFERRING DIAG: R26.81 (ICD-10-CM) - Gait instability   THERAPY DIAG:  Unsteadiness on feet  Other abnormalities of gait and mobility  Muscle weakness (generalized)  Repeated falls  Other low back pain  RATIONALE FOR EVALUATION AND TREATMENT:  Rehabilitation  ONSET DATE:  Fall 01/12/22 (2 falls in past 6 months)  SUBJECTIVE:   SUBJECTIVE STATEMENT: Pt notes better balance but notices calf soreness today.  Pt accompanied by: self  PAIN:  Are you having pain? No  PERTINENT HISTORY:  Aortic atherosclerosis; carotid stenosis; cervical myelopathy s/p ACDF 06/2020 and posterior cervical fusion 11/2020; peripheral neuropathy; lumbar DDD/spondylosis; lumbago with sciatica/lumbar radiculopathy; OA; B TKA; BTHA; L greater trochanteric pain syndrome; L RCR 2014; recent CTR surgery - early May; hearing loss; HTN; BPPV/vertigo; B sacroiliitis; skin cancer; COPD/emphysema; sleep apnea with CPAP; GERD; gout; HLD; LE cramps; RLS  PRECAUTIONS: None  WEIGHT BEARING RESTRICTIONS: No  FALLS: Has patient fallen in last 6 months? Yes.  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 No acute intracranial abnormality.  2. No acute fracture or traumatic subluxation of the cervical spine. L wrist x-ray: 1. Mild dorsal wrist soft tissue swelling with small ossific density overlying the proximal carpal row. Correlate for triquetral fracture.  2. Moderate scaphotrapeziotrapezoid and mild first CMC joint osteoarthritis. L elbow x-ray: 1. No acute fracture or dislocation, left elbow.  2. Mild elevation of the anterior fat pad suggestive of a small joint effusion, which may be reactive in the setting of elbow osteoarthritis. L shoulder x-rays: No acute osseous abnormality.  COGNITION: Overall cognitive status: Within functional limits for tasks assessed and Impaired: Memory: Impaired: Short term Procedural   SENSATION: Peripheral neuropathy in B feet  COORDINATION: Mildly impaired with slow movements  EDEMA:  Mild to moderate edema in distal B LE  MUSCLE LENGTH: Hamstrings: mod tight R>L ITB: mod tight B Piriformis: mod tight L>R Hip flexors: mod tight B Quads: mod tight B Heelcord: mod tight B  POSTURE:  rounded shoulders, forward head, and flexed trunk   LOWER EXTREMITY MMT:   Tested in sitting on eval  02/22/22& 03/22/22 MMT Right 02/22/22 Left 02/22/22 Right 03/22/22 Left 03/22/22  Hip flexion 3+ 4- 3+ 4-  Hip extension 3- 3- 3+ 3+  Hip abduction 4- 4 4 4+  Hip adduction _0 4+  Hip internal rotation 3 3+ 3 3+  Hip external rotation 3+ 3+ 3+ 3+  Knee flexion 4- 4- 4 4  Knee extension  4- 4 4 4+  Ankle dorsiflexion 4- 4 4- 4  Ankle plantarflexion 3+  6 SLS HR 4 10 SLS HR 4+ 20 SLS HR Limited lift on last few reps 5 20 SLS HR  Ankle inversion 3+ 4- 3+ 4-  Ankle eversion 3+ 4- 3+ 4-  (Blank rows = not tested)  BED MOBILITY:  Sit to supine SBA Supine to sit SBA  TRANSFERS: Assistive device utilized: Single point cane and None  Sit to stand: Modified independence Stand to sit: Modified independence Chair to chair: Modified independence Floor:  NT  GAIT: Gait pattern: step through pattern, decreased stride length, decreased hip/knee flexion- Right, decreased hip/knee flexion- Left, decreased trunk rotation, and wide BOS Distance walked: 80 Assistive device utilized: Single point cane Level of assistance: SBA Comments: Gait speed: 2.10 ft/sec with SPC, 2.31 ft/sec w/o AD  RAMP: Level of Assistance:  NT Assistive device utilized:    Ramp Comments:   CURB:  Level of Assistance:  NT Assistive device utilized:    Curb Comments:   STAIRS:  Level of Assistance:  NT  Stair Negotiation Technique:   Number of Stairs:    Height of Stairs:   Comments:   FUNCTIONAL TESTS:  5 times sit to stand: 13.0 sec Timed up and go (TUG): 13.53 sec with SPC; 14.19 sec w/o AD 10 meter walk test: 15.60 sec with SPC; 13.04 sec w/o AD Berg Balance Scale: 39/56 - scores 37-45 indicate significant risk for falls (>80%) Dynamic Gait Index: 11/24; Scores of 19 or less are predictive of falls in older community living adults  PATIENT SURVEYS:  ABC scale 55% of self confidence; 45% impairment  TODAY'S TREATMENT:  03/27/22 Therapeutic Exercise: Nustep [id number] Seated clams unilateral clam  20x YTB Seated marches with YTB 3x5 reps no UE support Seated rows 2x15 reps RTB Seated extension with RTB  15 reps Standing glute sets at wall 10 reps Standing hip abduction at counter 10x Standing marching at counter 10x  03/22/22 THERAPEUTIC EXERCISE: to improve flexibility, strength and mobility.  Verbal and tactile cues throughout for technique. NuStep L5 x 6 min Seated B YTB clam 10 x 3", 2 sets (band modified with loops at ends, wrapped around lower things and crossed on top with loops anchored around wrists) Seated R/L unilateral YTB clam 10 x 3", 2 sets Seated YTB march 2 x 10 Seated TrA + YTB rows 10 x 3", 2 sets  THERAPEUTIC ACTIVITIES: STG assessment: Berg 43/56 LE MMT   03/20/22 THERAPEUTIC EXERCISE: to improve flexibility, strength and mobility.  Verbal and tactile cues throughout for technique. Seated R/L hip adductor stretch 2 x 30 sec Standing hip flexor stretch (fwd lunge with foot on 9" stool) 2 x 30 sec Hooklying R/L KTOS piriformis stretch 2 x 30 sec Hooklying B frog leg/butterfly hip adductor stretch 2 x 30 sec Hooklying LTR 5 x 10 sec Hooklying alt LE bent-knee fallout 10 x 3"  SELF CARE: Reviewed the "Check for Safety - Home Fall Prevention Checklist for Older Adults" to help identify fall risk hazards in the home along with strategies to reduce fall risk at home    PATIENT EDUCATION: Education details: HEP update and HEP modification - transitioned YTB resisted bent knee fall out to sitting with modified band + added seated YTB march and rows with modified band Person educated: Patient Education method: Explanation, Demonstration, Tactile cues, Verbal cues, and Handouts Education comprehension: verbalized understanding, returned demonstration, verbal cues required, tactile cues required, and needs further  education   HOME EXERCISE PROGRAM: Access Code: [email or web address] URL: [email or web address] Date: 03/22/2022 Prepared by: Annie Paras  Exercises - Standing Gastroc Stretch at Counter  - 1-2 x daily - 7 x weekly - 2-3 reps - 30 sec hold - Standing Soleus Stretch at Counter  - 1-2 x daily - 7 x weekly - 2-3 reps - 30 sec hold - Hip Flexor Stretch on Step  - 1-2 x daily - 7 x weekly - 2-3 reps - 30 sec hold - Side Stepping with Resistance at Ankles and Counter Support  - 1 x daily - 7 x weekly - 2 sets - 10 reps - Forward and Backward Step Over with Counter Support  - 1 x daily - 7 x weekly - 2 sets - 10 reps - Turning in Corner 360  - 1 x daily - 7 x weekly - 2 sets - 5 reps - Tandem Stance in Corner  - 1 x daily - 7 x weekly - 2 reps - 30 sec hold - Single Leg Stance with Support  - 1 x daily - 7 x weekly - 2 sets - 30 sec hold - Squat with Chair Touch  - 1 x daily - 7 x weekly - 2 sets - 10 reps - 3 sec hold - Seated Hamstring Stretch  - 1-2 x daily - 7 x weekly - 2 sets - 30 sec hold - Seated Hip Adductor Stretch  - 1-2 x daily - 7 x weekly - 3 reps - 30 sec hold - Seated Figure 4 Piriformis Stretch  - 1-2 x daily - 7 x weekly - 2 sets - 30 sec hold - Supine Piriformis Stretch with Foot on Ground  - 1-2 x daily - 7 x weekly - 2-3 reps - 30 sec hold - Supine Lower Trunk Rotation  - 1 x daily - 7 x weekly - 2 sets - 30 sec hold - Bent Knee Fallouts with Alternating Legs  - 1 x daily - 7 x weekly - 2 sets - 10 reps - 3 sec hold - Seated Hip Abduction with Resistance  - 1 x daily - 3-4 x weekly - 2 sets - 10 reps - 3-5 sec hold - Seated Single Leg Hip Abduction with Resistance  - 1 x daily - 3-4 x weekly - 2 sets - 10 reps - 3 sec hold - Seated March with Resistance  - 1 x daily - 3-4 x weekly - 2 sets - 10 reps - 3 sec hold - Seated Shoulder Row with Anchored Resistance  - 1 x daily - 3-4 x weekly - 2 sets - 10 reps - 3-5 hold hold  Patient Education - Check for Safety   ASSESSMENT:  CLINICAL IMPRESSION: Pt tolerated the progression of TE well with no pain although he was limited by fatigue with some exercises in  sitting and standing. Cueing required throughout session with exercises for eccentric control and correct form. He was able to progress to red TB for HEP after reviewing exercises. He would continue to benefit from PT to improve tolerance for ADLs and reduce fall risk.  OBJECTIVE IMPAIRMENTS Abnormal gait, decreased activity tolerance, decreased balance, decreased endurance, decreased knowledge of condition, decreased knowledge of use of DME, decreased mobility, difficulty walking, decreased ROM, decreased strength, decreased safety awareness, increased fascial restrictions, impaired perceived functional ability, increased muscle spasms, impaired flexibility, improper body mechanics, postural dysfunction, and pain.   ACTIVITY LIMITATIONS carrying, lifting, bending, standing, squatting, stairs, transfers, and  locomotion level  PARTICIPATION LIMITATIONS: cleaning, laundry, interpersonal relationship, community activity, and yard work  PERSONAL FACTORS Age, Fitness, Past/current experiences, Time since onset of injury/illness/exacerbation, and 3+ comorbidities: Aortic atherosclerosis; carotid stenosis; cervical myelopathy s/p ACDF 06/2020 and posterior cervical fusion 11/2020; peripheral neuropathy; lumbar DDD/spondylosis; lumbago with sciatica/lumbar radiculopathy; OA; B TKA; BTHA; L greater trochanteric pain syndrome; L RCR 2014; recent CTR surgery - early May; hearing loss; HTN; BPPV/vertigo; B sacroiliitis; skin cancer; COPD/emphysema; sleep apnea with CPAP; GERD; gout; HLD; LE cramps; RLS  are also affecting patient's functional outcome.   REHAB POTENTIAL: Good  CLINICAL DECISION MAKING: Evolving/moderate complexity  EVALUATION COMPLEXITY: Moderate  GOALS: Goals reviewed with patient? Yes  SHORT TERM GOALS: Target date: 03/22/2022   Patient will be independent with initial HEP. Baseline:  Goal status: MET  03/22/22  2.  Patient will demonstrate decreased fall risk by scoring > 45/56 on  Berg. Baseline: 39/56 Goal status: IN PROGRESS  03/22/22 - Progressed to 43/56  3.  Patient will be educated on strategies to decrease risk of falls.  Baseline:  Goal status: MET  03/22/22 - Education provided last visit - Pt reports no concerns as he went through the home checklist other than the bathroom mat non-skid backing has disintegrated from repeated washing (he states he pushes it to the side while he is in the BR)   LONG TERM GOALS: Target date: 04/19/2022   Patient will be independent with advanced/ongoing HEP to improve outcomes and carryover.  Baseline:  Goal status: IN PROGRESS  2.  Patient will be able to ambulate 600' with LRAD with good safety to access community.  Baseline:  Goal status: IN PROGRESS  3.  Patient will be able to step up/down curb safely with LRAD for safety with community ambulation.  Baseline:  Goal status: IN PROGRESS   4.  Patient will demonstrate improved functional LE strength as demonstrated by overall MMT >/= 4 to 4+/5. Baseline:  Goal status: IN PROGRESS  03/22/22 - Progressing (refer to MMT chart)  5.  Patient will demonstrate at least 19/24 on DGI to improve gait stability and reduce risk for falls. Baseline: 11/24 Goal status: IN PROGRESS  6.  Patient will score >/= 52/56 on Berg Balance test to demonstrate lower risk of falls. (MCID= 8 points) .  Baseline: 39/56 Goal status: IN PROGRESS  7.  Patient will report >67% of self confidence on ABC scale to demonstrate improved functional ability. Baseline: 55% self confidence; 45 % impairment Goal status: IN PROGRESS  8.  Patient will demonstrate gait speed of >/= 2.62 ft/sec (0.80 m/s) to be a safe community ambulator with decreased risk for recurrent falls.  Baseline: 2.10 ft/sec with SPC, 2.31 ft/sec w/o AD Goal status: IN PROGRESS   PLAN: PT FREQUENCY: 2x/week  PT DURATION: 8 weeks  PLANNED INTERVENTIONS: Therapeutic exercises, Therapeutic activity, Neuromuscular re-education,  Balance training, Gait training, Patient/Family education, Joint mobilization, Stair training, Vestibular training, Canalith repositioning, Dry Needling, Electrical stimulation, Spinal manipulation, Cryotherapy, Moist heat, Taping, Ultrasound, Ionotophoresis 40m/ml Dexamethasone, Manual therapy, and Re-evaluation  PLAN FOR NEXT SESSION:  progress lumbopelvic/LE flexibility and strengthening; balance training; HEP review/update/modification as needed   BArtist Pais PTA 03/28/2022, 11:51 AM

## 2022-03-31 ENCOUNTER — Encounter: Payer: Self-pay | Admitting: Physical Therapy

## 2022-03-31 ENCOUNTER — Ambulatory Visit: Payer: Medicare HMO | Admitting: Physical Therapy

## 2022-03-31 DIAGNOSIS — M5459 Other low back pain: Secondary | ICD-10-CM

## 2022-03-31 DIAGNOSIS — R2681 Unsteadiness on feet: Secondary | ICD-10-CM | POA: Diagnosis not present

## 2022-03-31 DIAGNOSIS — R2689 Other abnormalities of gait and mobility: Secondary | ICD-10-CM

## 2022-03-31 DIAGNOSIS — M6281 Muscle weakness (generalized): Secondary | ICD-10-CM

## 2022-03-31 DIAGNOSIS — R296 Repeated falls: Secondary | ICD-10-CM | POA: Diagnosis not present

## 2022-03-31 NOTE — Therapy (Signed)
OUTPATIENT PHYSICAL THERAPY TREATMENT / PROGRESS NOTE   Patient Name: [name] Pratt MRN: [id number] DOB:[birth year], 81 y.o., male Today's Date: 03/31/2022  Progress Note  Reporting Period 02/22/2022 to 03/31/2022  See note below for Objective Data and Assessment of Progress/Goals.      PT End of Session - 03/31/22 1102     Visit Number 7    Number of Visits 17    Date for PT Re-Evaluation 04/19/22    Authorization Type [name]    Authorization Time Period 02/24/22 - 04/19/22    Authorization - Visit Number 6    Authorization - Number of Visits 16    PT Start Time 1102    PT Stop Time 1149    PT Time Calculation (min) 47 min    Activity Tolerance Patient tolerated treatment well    Behavior During Therapy [facility] for tasks assessed/performed                   Past Medical History:  Diagnosis Date   Allergic rhinitis 02/02/2016   Anemia 12/17/2009   Formatting of this note might be different from the original. Anemia  10/1 IMO update   Aortic atherosclerosis ([place]) 03/05/2020   Arthritis    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   Atypical chest pain 11/24/2015   Benign essential hypertension 05/13/2009   Qualifier: Diagnosis of  By: Larwance Sachs of this note might be different from the original. Hypertension   Benign paroxysmal positional vertigo 09/14/2021   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[place]) 07/22/2021   Body mass index (BMI) 25.0-25.9, adult 11/02/2020   Cancer ([place])    skin cancer   Cardiac murmur 07/25/2021   Carotid stenosis    a. Carotid U/S 5/72: LICA < 62%, RICA 03-55%;  b.  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   Cystoid macular edema of both eyes 07/28/2020   Degenerative disc disease, lumbar 06/30/2015   Deviated nasal septum 02/18/2015   Diverticulosis 03/03/2020   DJD (degenerative joint disease) of hip 12/24/2012   Dyspnea 06/12/2011   Emphysema, unspecified ([place]) 03/05/2020   Erectile dysfunction 06/20/2017   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[place]) 02/09/2021   Gait disorder 03/02/2021   GERD (gastroesophageal reflux disease)    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   Qualifier: Diagnosis of  By: Burnett Kanaris     Greater trochanteric pain syndrome 07/22/2021   Hand pain, left 01/16/2022   Hearing loss 02/02/2016   Heme positive stool 02/29/2020   HIATAL HERNIA 05/13/2009   Qualifier: Diagnosis of  By: Burnett Kanaris     High risk medication use 01/18/2012   Hip pain 03/02/2021   History of chicken pox    History of COVID-19 12/05/2021   History of hemorrhoids    History of kidney stones    History of skin cancer 07/22/2021   skin cancer   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 Cardiology - Dr Frances Nickels at  [place] [facility]  ICD-10 cut over   Hypertension    under control   Hypokalemia 07/08/2021   Hypothyroidism    Internal hemorrhoids 03/03/2020   Leg cramps 11/13/2019   Lower GI bleed 12/12/2012   Lumbago of lumbar region with sciatica 10/21/2020   Lumbar radiculopathy 03/02/2021   Lumbar spondylosis 11/02/2020   NEPHROLITHIASIS 05/13/2009   Qualifier: Diagnosis of  By: Burnett Kanaris     Nonspecific abnormal electrocardiogram (ECG) (EKG) 01/06/2013   Numbness of hand 01/24/2022   Onychomycosis 06/30/2015   Osteoarthrosis, 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  Osteoarthritis Of The Hand  10/1 IMO update   Osteoarthrosis, unspecified whether generalized or localized, pelvic region and thigh 07/25/2010   Formatting of this note might be different from the original. Osteoarthritis Of The Hip   Other abnormal glucose 07/22/2021   Other allergic rhinitis 02/18/2015   Palpitations 07/25/2021   Peripheral neuropathy 06/19/2016   Preventative health care 11/24/2015   Right groin pain 11/29/2012   Right inguinal hernia 05/13/2009   Qualifier: Diagnosis of  By: Burnett Kanaris     RLS (restless legs syndrome) 02/18/2015   Rosacea    Rotator cuff tear 07/27/2013   Situational depression 03/02/2021   Skin lesion 01/16/2022   Sleep apnea with use of continuous positive airway pressure (CPAP) 07/15/2013   CPAP set to  7 cm water,  Residual AHi was 7.8  With no leak.  User time 6 hours.  479 days , not used only 12 days - highly compliant 06-23-13  .    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   Urinary retention 11/26/2020   Ventral hernia 07/08/2012   Vertigo 07/08/2021   Weight loss 03/03/2020   Past Surgical History:  Procedure Laterality Date   ABDOMINAL HERNIA REPAIR  07/15/12   Dr [name]   ANTERIOR CERVICAL DECOMP/DISCECTOMY FUSION N/A 07/09/2020   Procedure: ANTERIOR CERVICAL DECOMPRESSION AND FUSION CERVICAL THREE-FOUR.;  Surgeon: [name], MD;  Location: [facility];  Service: Neurosurgery;  Laterality: N/A;  anterior   BROW LIFT  05/07/01   CYSTOSCOPY WITH URETEROSCOPY AND STENT PLACEMENT Right 01/28/2014   Procedure: CYSTOSCOPY WITH URETEROSCOPY, BASKET RETRIVAL AND  STENT PLACEMENT;  Surgeon: [name], MD;   Location: [facility];  Service: Urology;  Laterality: Right;   epidural injections     multiple procedures   ESOPHAGEAL DILATION  1993 and 1994   multiple times   EYE SURGERY Bilateral 03/25/10, 2012   cataract removal   HEMORRHOID SURGERY  2014   HIATAL HERNIA REPAIR  12/21/93   HOLMIUM LASER APPLICATION Right 4/43/1540   Procedure: HOLMIUM LASER APPLICATION;  Surgeon: [name], MD;  Location: [facility];  Service: Urology;  Laterality: Right;   INGUINAL HERNIA REPAIR Right 07/15/12   Dr [name], x2   JOINT REPLACEMENT  07/25/04   right knee   JOINT REPLACEMENT  07/13/10   left knee   [place]  09/20/06   with intraoperative cholangiogram and right inguinal herniorrhaphy with mesh    LIGAMENT REPAIR Left 07/2013   shoulder   LITHOTRIPSY  x2   NASAL SEPTUM SURGERY  1975   POSTERIOR CERVICAL FUSION/FORAMINOTOMY N/A 11/24/2020   Procedure: Posterior Cervical Laminectomy Cervical three-four, Cervical four-five with lateral mass fusion;  Surgeon: [name], MD;  Location: [facility];  Service: Neurosurgery;  Laterality: N/A;   REFRACTIVE SURGERY Bilateral    Right total hip replacement  4/14   SKIN CANCER DESTRUCTION     nose, ear   TONSILLECTOMY  as child   TOTAL HIP ARTHROPLASTY Left    URETHRAL DILATION  1991   [name]   Patient Active Problem List   Diagnosis Date Noted   Irregular heart beat 02/03/2022   Constipation 02/03/2022   Carpal tunnel syndrome 01/24/2022   Numbness of hand 01/24/2022   Ulnar neuropathy 01/24/2022   Skin lesion 01/16/2022   Hand pain, left 01/16/2022   History of COVID-19 12/05/2021   Benign paroxysmal positional vertigo 09/14/2021   Palpitations 07/25/2021   Cardiac murmur 07/25/2021   History of chicken pox 07/22/2021   History of hemorrhoids 07/22/2021   Hypertension 07/22/2021   COPD (chronic obstructive pulmonary disease) ([place]) 07/22/2021   History of kidney stones 07/22/2021   History of skin cancer  56/38/9373   Complication of anesthesia 07/22/2021   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[place]) 07/22/2021   Other abnormal glucose 07/22/2021   Spondylitis ([place]) 07/22/2021   Greater trochanteric pain syndrome 07/22/2021   Vertigo 07/08/2021   Hypokalemia 07/08/2021   Trochanteric bursitis of left hip 06/30/2021   Cancer ([place]) 04/11/2021   Lumbar radiculopathy 03/02/2021   Gait instability 03/02/2021   Hip pain 03/02/2021   Situational depression 03/02/2021   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[place]) 02/09/2021   Urinary retention 11/26/2020   Cervical myelopathy ([place]) 11/24/2020   Body mass index (BMI) 25.0-25.9, adult 11/02/2020   Lumbar spondylosis 11/02/2020   Lumbago of lumbar region with sciatica 10/21/2020   Cystoid macular edema of both eyes 07/28/2020   Status post cervical spinal fusion 07/09/2020   History of total bilateral knee replacement 07/08/2020   Aortic atherosclerosis ([place]) 03/05/2020   Emphysema, unspecified ([place]) 03/05/2020   Arthritis 03/04/2020   Diverticulosis 03/03/2020   Internal hemorrhoids 03/03/2020   Weight loss 03/03/2020   Heme positive stool 02/29/2020   Leg cramps 11/13/2019   Tibialis anterior tendon tear, nontraumatic 11/13/2019   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 07/29/2018   CPAP (continuous positive airway pressure) dependence 03/25/2018   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   Erectile dysfunction 06/20/2017   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 03/06/2017   Peripheral neuropathy 06/19/2016   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   Hearing loss 02/02/2016   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   Allergic rhinitis 02/02/2016   Atypical chest pain 11/24/2015   Preventative health care 11/24/2015   Onychomycosis 06/30/2015   Degenerative disc disease, lumbar 06/30/2015   Other allergic rhinitis 02/18/2015   Deviated nasal septum  02/18/2015   RLS (restless legs syndrome) 02/18/2015   GERD (gastroesophageal reflux disease) 09/18/2014   Hyperglycemia 03/03/2014   Rotator cuff tear 07/27/2013   Sleep apnea with use of continuous positive airway pressure (CPAP) 07/15/2013   Carotid stenosis 02/24/2013   Nonspecific abnormal electrocardiogram (ECG) (EKG) 01/06/2013   DJD (degenerative joint disease) of hip 12/24/2012   Lower GI bleed 12/12/2012   Right groin pain 11/29/2012   Ventral hernia 07/08/2012   High risk medication use 01/18/2012   Dyspnea 06/12/2011   Hyperlipidemia with target LDL  less than 130 12/27/2010   Osteoarthrosis, unspecified whether generalized or localized, pelvic region and thigh 07/25/2010   Osteoarthrosis, hand 06/13/2010   Anemia 12/17/2009   Hypothyroidism 05/13/2009   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   Benign essential hypertension 05/13/2009   Right inguinal hernia 05/13/2009   HIATAL HERNIA 05/13/2009   NEPHROLITHIASIS 05/13/2009   ROSACEA 05/13/2009    PCP: [name], NP  REFERRING PROVIDER: [name], NP  REFERRING DIAG: R26.81 (ICD-10-CM) - Gait instability   THERAPY DIAG:  Unsteadiness on feet  Other abnormalities of gait and mobility  Muscle weakness (generalized)  Repeated falls  Other low back pain  RATIONALE FOR EVALUATION AND TREATMENT:  Rehabilitation  ONSET DATE:  Fall 01/12/22 (2 falls in past 6 months)  SUBJECTIVE:   SUBJECTIVE STATEMENT: Pt denies pain today but c/o "just feel weak". He denies any recent falls.  PAIN:  Are you having pain? No  PERTINENT HISTORY:  Aortic atherosclerosis; carotid stenosis; cervical myelopathy s/p ACDF 06/2020 and posterior cervical fusion 11/2020; peripheral neuropathy; lumbar DDD/spondylosis; lumbago with sciatica/lumbar radiculopathy; OA; B TKA; BTHA; L greater trochanteric pain syndrome; L RCR 2014; recent CTR surgery - early May; hearing loss; HTN; BPPV/vertigo; B sacroiliitis; skin cancer; COPD/emphysema; sleep  apnea with CPAP; GERD; gout; HLD; LE cramps; RLS  PRECAUTIONS: None  WEIGHT BEARING RESTRICTIONS: No  FALLS: Has patient fallen in last 6 months? Yes. 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 No acute intracranial abnormality.  2. No acute fracture or traumatic subluxation of the cervical spine. L wrist x-ray: 1. Mild dorsal wrist soft tissue swelling with small ossific density overlying the proximal carpal row. Correlate for triquetral fracture.  2. Moderate scaphotrapeziotrapezoid and mild first CMC joint osteoarthritis. L elbow x-ray: 1. No acute fracture or dislocation, left elbow.  2. Mild elevation of the anterior fat pad suggestive of a small joint effusion, which may be reactive in the setting of elbow osteoarthritis. L shoulder x-rays: No acute osseous abnormality.  COGNITION: Overall cognitive status: Within functional limits for tasks assessed and Impaired: Memory: Impaired: Short term Procedural   SENSATION: Peripheral neuropathy in B feet  COORDINATION: Mildly impaired with slow movements  EDEMA:  Mild to moderate edema in distal B LE  MUSCLE LENGTH: Hamstrings: mod tight R>L ITB: mod tight B Piriformis: mod tight L>R Hip flexors: mod tight B Quads:  mod tight B Heelcord: mod tight B  POSTURE:  rounded shoulders, forward head, and flexed trunk   LOWER EXTREMITY MMT:   Tested in sitting on eval 02/22/22& 03/22/22 MMT Right 02/22/22 Left 02/22/22 Right 03/22/22 Left 03/22/22  Hip flexion 3+ 4- 3+ 4-  Hip extension 3- 3- 3+ 3+  Hip abduction 4- 4 4 4+  Hip adduction '4 4 4 ' 4+  Hip  internal rotation 3 3+ 3 3+  Hip external rotation 3+ 3+ 3+ 3+  Knee flexion 4- 4- 4 4  Knee extension 4- 4 4 4+  Ankle dorsiflexion 4- 4 4- 4  Ankle plantarflexion 3+  6 SLS HR 4 10 SLS HR 4+ 20 SLS HR Limited lift on last few reps 5 20 SLS HR  Ankle inversion 3+ 4- 3+ 4-  Ankle eversion 3+ 4- 3+ 4-  (Blank rows = not tested)  BED MOBILITY:  Sit to supine SBA Supine to sit SBA  TRANSFERS: Assistive device utilized: Single point cane and None  Sit to stand: Modified independence Stand to sit: Modified independence Chair to chair: Modified independence Floor:  NT  GAIT: Gait pattern: step through pattern, decreased stride length, decreased hip/knee flexion- Right, decreased hip/knee flexion- Left, decreased trunk rotation, and wide BOS Distance walked: 80 Assistive device utilized: Single point cane Level of assistance: SBA Comments: Gait speed: 2.10 ft/sec with SPC, 2.31 ft/sec w/o AD  RAMP: Level of Assistance:  NT Assistive device utilized:    Ramp Comments:   CURB:  Level of Assistance:  NT Assistive device utilized:    Curb Comments:   STAIRS:  Level of Assistance:  NT  Stair Negotiation Technique:   Number of Stairs:    Height of Stairs:   Comments:   FUNCTIONAL TESTS:  5 times sit to stand: 13.0 sec Timed up and go (TUG): 13.53 sec with SPC; 14.19 sec w/o AD 10 meter walk test: 15.60 sec with SPC; 13.04 sec w/o AD Berg Balance Scale: 39/56 - scores 37-45 indicate significant risk for falls (>80%) Dynamic Gait Index: 11/24; Scores of 19 or less are predictive of falls in older community living adults  PATIENT SURVEYS:   ABC scale 55% of self confidence; 45% impairment  TODAY'S TREATMENT:   03/22/22 THERAPEUTIC EXERCISE: to improve flexibility, strength and mobility.  Verbal and tactile cues throughout for technique. NuStep L6 x 6 min Standing RTB row 10 x 3", 2 sets Standing RTB scap retraction + shoulder extension to neutral 10 x 3" Standing B RTB pallof press x 10 Seated B GTB clam 10 x 3" (band modified with loops at ends, wrapped around lower things and crossed on top with loops anchored around wrists) Seated R/L unilateral GTB clam 10 x 3" Seated GTB march x 10 - pt report green band feels a little to strong, therefore will continue with the red TB at home for now  THERAPEUTIC ACTIVITIES:  5 times sit to stand: 12.16 sec Timed up and go (TUG): 13.66 sec with SPC; 12.59 sec w/o AD 10 meter walk test: 11.31 sec with SPC; 11.25 sec w/o AD Gait speed: 2.90 ft/sec with SPC; 2.92 ft/sec w/o AD Dynamic Gait Index: 16/24; Scores of 19 or less are predictive of falls in older community living adults   03/27/22 Therapeutic Exercise: Nustep L6x9mn Seated clams unilateral clam 20x YTB Seated marches with YTB 3x5 reps no UE support Seated rows 2x15 reps RTB Seated extension with RTB  15 reps Standing glute sets at wall 10 reps Standing hip abduction at counter 10x Standing marching at counter 10x   03/22/22 THERAPEUTIC EXERCISE: to improve flexibility, strength and mobility.  Verbal and tactile cues throughout for technique. NuStep L5 x 6 min Seated B YTB clam 10 x 3", 2 sets (band modified with loops at ends, wrapped around lower things and crossed on top with loops anchored around wrists) Seated R/L unilateral YTB clam 10 x 3",  2 sets Seated YTB march 2 x 10 Seated TrA + YTB rows 10 x 3", 2 sets  THERAPEUTIC ACTIVITIES: STG assessment: Merrilee Jansky 43/56 LE MMT   PATIENT EDUCATION: Education details: progress with PT Person educated: Patient Education method: Explanation Education  comprehension: verbalized understanding   HOME EXERCISE PROGRAM: Access Code: [email or web address] URL: [email or web address] Date: 03/22/2022 Prepared by: Annie Paras  Exercises - Standing Gastroc Stretch at Counter  - 1-2 x daily - 7 x weekly - 2-3 reps - 30 sec hold - Standing Soleus Stretch at Counter  - 1-2 x daily - 7 x weekly - 2-3 reps - 30 sec hold - Hip Flexor Stretch on Step  - 1-2 x daily - 7 x weekly - 2-3 reps - 30 sec hold - Side Stepping with Resistance at Ankles and Counter Support  - 1 x daily - 7 x weekly - 2 sets - 10 reps - Forward and Backward Step Over with Counter Support  - 1 x daily - 7 x weekly - 2 sets - 10 reps - Turning in Corner 360  - 1 x daily - 7 x weekly - 2 sets - 5 reps - Tandem Stance in Corner  - 1 x daily - 7 x weekly - 2 reps - 30 sec hold - Single Leg Stance with Support  - 1 x daily - 7 x weekly - 2 sets - 30 sec hold - Squat with Chair Touch  - 1 x daily - 7 x weekly - 2 sets - 10 reps - 3 sec hold - Seated Hamstring Stretch  - 1-2 x daily - 7 x weekly - 2 sets - 30 sec hold - Seated Hip Adductor Stretch  - 1-2 x daily - 7 x weekly - 3 reps - 30 sec hold - Seated Figure 4 Piriformis Stretch  - 1-2 x daily - 7 x weekly - 2 sets - 30 sec hold - Supine Piriformis Stretch with Foot on Ground  - 1-2 x daily - 7 x weekly - 2-3 reps - 30 sec hold - Supine Lower Trunk Rotation  - 1 x daily - 7 x weekly - 2 sets - 30 sec hold - Bent Knee Fallouts with Alternating Legs  - 1 x daily - 7 x weekly - 2 sets - 10 reps - 3 sec hold - Seated Hip Abduction with Resistance  - 1 x daily - 3-4 x weekly - 2 sets - 10 reps - 3-5 sec hold - Seated Single Leg Hip Abduction with Resistance  - 1 x daily - 3-4 x weekly - 2 sets - 10 reps - 3 sec hold - Seated March with Resistance  - 1 x daily - 3-4 x weekly - 2 sets - 10 reps - 3 sec hold - Seated Shoulder Row with Anchored Resistance  - 1 x daily - 3-4 x weekly - 2 sets - 10 reps - 3-5 hold hold  Patient  Education - Check for Safety   ASSESSMENT:  CLINICAL IMPRESSION: [name] Pratt "[name] Pratt" feels like his balance is improving and denies any recent falls. Standardized balance testing revealing gains across all test except TUG with cane. Gait speed has improved to 2.90 ft/sec with SPC and 2.92 ft/sec w/o AD (high guard when ambulating w/o AD) - LTG #8 met. He continues to note weakness/fatigue in core muscles especially in standing, therefore continued focus on core and lumbopelvic strengthening, progressing some exercises to standing with SBA of PT for balance. [name] Pratt is progressing well  with PT with progress observed toward all LTGs. He will continue to benefit from skilled PT to improve LE/core strength and balance to improve tolerance for ADLs and reduce fall risk.  OBJECTIVE IMPAIRMENTS Abnormal gait, decreased activity tolerance, decreased balance, decreased endurance, decreased knowledge of condition, decreased knowledge of use of DME, decreased mobility, difficulty walking, decreased ROM, decreased strength, decreased safety awareness, increased fascial restrictions, impaired perceived functional ability, increased muscle spasms, impaired flexibility, improper body mechanics, postural dysfunction, and pain.   ACTIVITY LIMITATIONS carrying, lifting, bending, standing, squatting, stairs, transfers, and locomotion level  PARTICIPATION LIMITATIONS: cleaning, laundry, interpersonal relationship, community activity, and yard work  PERSONAL FACTORS Age, Fitness, Past/current experiences, Time since onset of injury/illness/exacerbation, and 3+ comorbidities: Aortic atherosclerosis; carotid stenosis; cervical myelopathy s/p ACDF 06/2020 and posterior cervical fusion 11/2020; peripheral neuropathy; lumbar DDD/spondylosis; lumbago with sciatica/lumbar radiculopathy; OA; B TKA; BTHA; L greater trochanteric pain syndrome; L RCR 2014; recent CTR surgery - early May; hearing loss; HTN; BPPV/vertigo; B sacroiliitis;  skin cancer; COPD/emphysema; sleep apnea with CPAP; GERD; gout; HLD; LE cramps; RLS  are also affecting patient's functional outcome.   REHAB POTENTIAL: Good  CLINICAL DECISION MAKING: Evolving/moderate complexity  EVALUATION COMPLEXITY: Moderate  GOALS: Goals reviewed with patient? Yes  SHORT TERM GOALS: Target date: 03/22/2022   Patient will be independent with initial HEP. Baseline:  Goal status: MET  03/22/22  2.  Patient will demonstrate decreased fall risk by scoring > 45/56 on Berg. Baseline: 39/56 Goal status: IN PROGRESS  03/22/22 - Progressed to 43/56  3.  Patient will be educated on strategies to decrease risk of falls.  Baseline:  Goal status: MET  03/22/22 - Education provided last visit - Pt reports no concerns as he went through the home checklist other than the bathroom mat non-skid backing has disintegrated from repeated washing (he states he pushes it to the side while he is in the BR)   LONG TERM GOALS: Target date: 04/19/2022   Patient will be independent with advanced/ongoing HEP to improve outcomes and carryover.  Baseline:  Goal status: IN PROGRESS  2.  Patient will be able to ambulate 600' with LRAD with good safety to access community.  Baseline:  Goal status: IN PROGRESS  3.  Patient will be able to step up/down curb safely with LRAD for safety with community ambulation.  Baseline:  Goal status: IN PROGRESS   4.  Patient will demonstrate improved functional LE strength as demonstrated by overall MMT >/= 4 to 4+/5. Baseline:  Goal status: IN PROGRESS  03/22/22 - Progressing (refer to MMT chart)  5.  Patient will demonstrate at least 19/24 on DGI to improve gait stability and reduce risk for falls. Baseline: 11/24 Goal status: IN PROGRESS  6.  Patient will score >/= 52/56 on Berg Balance test to demonstrate lower risk of falls. (MCID= 8 points) .  Baseline: 39/56 (eval - 02/22/22); 43/56 (03/22/22) Goal status: IN PROGRESS  7.  Patient will report  >67% of self confidence on ABC scale to demonstrate improved functional ability. Baseline: 55% self confidence; 45 % impairment Goal status: IN PROGRESS  8.  Patient will demonstrate gait speed of >/= 2.62 ft/sec (0.80 m/s) to be a safe community ambulator with decreased risk for recurrent falls.  Baseline: 2.10 ft/sec with SPC, 2.31 ft/sec w/o AD (eval); 2.90 ft/sec with SPC; 2.92 ft/sec w/o AD (03/31/22) Goal status: MET  03/31/22   PLAN: PT FREQUENCY: 2x/week  PT DURATION: 8 weeks  PLANNED INTERVENTIONS: Therapeutic exercises, Therapeutic  activity, Neuromuscular re-education, Balance training, Gait training, Patient/Family education, Joint mobilization, Stair training, Vestibular training, Canalith repositioning, Dry Needling, Electrical stimulation, Spinal manipulation, Cryotherapy, Moist heat, Taping, Ultrasound, Ionotophoresis 59m/ml Dexamethasone, Manual therapy, and Re-evaluation  PLAN FOR NEXT SESSION:  progress lumbopelvic/LE flexibility and strengthening; balance training; HEP review/update/modification as needed   JPercival Spanish PT 03/31/2022, 12:06 PM

## 2022-04-03 ENCOUNTER — Ambulatory Visit: Payer: Medicare HMO

## 2022-04-03 DIAGNOSIS — M6281 Muscle weakness (generalized): Secondary | ICD-10-CM | POA: Diagnosis not present

## 2022-04-03 DIAGNOSIS — M5459 Other low back pain: Secondary | ICD-10-CM

## 2022-04-03 DIAGNOSIS — R2689 Other abnormalities of gait and mobility: Secondary | ICD-10-CM

## 2022-04-03 DIAGNOSIS — R2681 Unsteadiness on feet: Secondary | ICD-10-CM | POA: Diagnosis not present

## 2022-04-03 DIAGNOSIS — R296 Repeated falls: Secondary | ICD-10-CM | POA: Diagnosis not present

## 2022-04-03 NOTE — Therapy (Signed)
OUTPATIENT PHYSICAL THERAPY TREATMENT    Patient Name: [name] Pratt MRN: [id number] DOB:August 31, 1941, 81 y.o., male Today's Date: 04/03/2022       PT End of Session - 04/03/22 1059     Visit Number 8    Number of Visits 17    Date for PT Re-Evaluation 04/19/22    Authorization Type [name]    Authorization Time Period 02/24/22 - 04/19/22    Authorization - Visit Number 7    Authorization - Number of Visits 16    PT Start Time 9643    PT Stop Time 1058    PT Time Calculation (min) 43 min    Activity Tolerance Patient tolerated treatment well    Behavior During Therapy [facility] for tasks assessed/performed                    Past Medical History:  Diagnosis Date   Allergic rhinitis 02/02/2016   Anemia 12/17/2009   Formatting of this note might be different from the original. Anemia  10/1 IMO update   Aortic atherosclerosis ([place]) 03/05/2020   Arthritis    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   Atypical chest pain 11/24/2015   Benign essential hypertension 05/13/2009   Qualifier: Diagnosis of  By: Larwance Sachs of this note might be different from the original. Hypertension   Benign paroxysmal positional vertigo 09/14/2021   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[place]) 07/22/2021   Body mass index (BMI) 25.0-25.9, adult 11/02/2020   Cancer ([place])    skin cancer   Cardiac murmur 07/25/2021   Carotid stenosis    a. Carotid U/S 8/38: LICA < 18%, RICA 40-37%;  b.  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   Cystoid macular edema of both eyes 07/28/2020   Degenerative disc disease, lumbar 06/30/2015    Deviated nasal septum 02/18/2015   Diverticulosis 03/03/2020   DJD (degenerative joint disease) of hip 12/24/2012   Dyspnea 06/12/2011   Emphysema, unspecified ([place]) 03/05/2020   Erectile dysfunction 06/20/2017   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[place]) 02/09/2021   Gait disorder 03/02/2021   GERD (gastroesophageal reflux disease)    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   Qualifier: Diagnosis of  By: Burnett Kanaris     Greater trochanteric pain syndrome 07/22/2021   Hand pain, left 01/16/2022   Hearing loss 02/02/2016   Heme positive stool 02/29/2020   HIATAL HERNIA 05/13/2009   Qualifier: Diagnosis of  By: Burnett Kanaris     High risk medication use 01/18/2012   Hip pain 03/02/2021   History of chicken pox    History of COVID-19 12/05/2021   History of hemorrhoids    History of kidney stones    History of skin cancer 07/22/2021   skin cancer   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 Cardiology - Dr Frances Nickels at [facility] cardiology  ICD-10 cut over   Hypertension    under control   Hypokalemia 07/08/2021  Hypothyroidism    Internal hemorrhoids 03/03/2020   Leg cramps 11/13/2019   Lower GI bleed 12/12/2012   Lumbago of lumbar region with sciatica 10/21/2020   Lumbar radiculopathy 03/02/2021   Lumbar spondylosis 11/02/2020   NEPHROLITHIASIS 05/13/2009   Qualifier: Diagnosis of  By: Burnett Kanaris     Nonspecific abnormal electrocardiogram (ECG) (EKG) 01/06/2013   Numbness of hand 01/24/2022   Onychomycosis 06/30/2015   Osteoarthrosis, 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 Osteoarthritis Of The Hand  10/1 IMO update   Osteoarthrosis, unspecified whether generalized or localized, pelvic region and thigh 07/25/2010   Formatting  of this note might be different from the original. Osteoarthritis Of The Hip   Other abnormal glucose 07/22/2021   Other allergic rhinitis 02/18/2015   Palpitations 07/25/2021   Peripheral neuropathy 06/19/2016   Preventative health care 11/24/2015   Right groin pain 11/29/2012   Right inguinal hernia 05/13/2009   Qualifier: Diagnosis of  By: Burnett Kanaris     RLS (restless legs syndrome) 02/18/2015   Rosacea    Rotator cuff tear 07/27/2013   Situational depression 03/02/2021   Skin lesion 01/16/2022   Sleep apnea with use of continuous positive airway pressure (CPAP) 07/15/2013   CPAP set to  7 cm water,  Residual AHi was 7.8  With no leak.  User time 6 hours.  479 days , not used only 12 days - highly compliant 06-23-13  .    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   Urinary retention 11/26/2020   Ventral hernia 07/08/2012   Vertigo 07/08/2021   Weight loss 03/03/2020   Past Surgical History:  Procedure Laterality Date   ABDOMINAL HERNIA REPAIR  07/15/12   Dr [name]   ANTERIOR CERVICAL DECOMP/DISCECTOMY FUSION N/A 07/09/2020   Procedure: ANTERIOR CERVICAL DECOMPRESSION AND FUSION CERVICAL THREE-FOUR.;  Surgeon: [name], MD;  Location: [facility];  Service: Neurosurgery;  Laterality: N/A;  anterior   BROW LIFT  05/07/01   CYSTOSCOPY WITH URETEROSCOPY AND STENT PLACEMENT Right 01/28/2014   Procedure: CYSTOSCOPY WITH URETEROSCOPY, BASKET RETRIVAL AND  STENT PLACEMENT;  Surgeon: [name], MD;  Location: [facility];  Service: Urology;  Laterality: Right;   epidural injections     multiple procedures   ESOPHAGEAL DILATION  1993 and 1994   multiple times    EYE SURGERY Bilateral 03/25/10, 2012   cataract removal   HEMORRHOID SURGERY  2014   HIATAL HERNIA REPAIR  12/21/93   HOLMIUM LASER APPLICATION Right 3/54/6568   Procedure: HOLMIUM LASER APPLICATION;  Surgeon: [name], MD;  Location: [facility];  Service: Urology;  Laterality: Right;   INGUINAL HERNIA REPAIR Right 07/15/12   Dr [name], x2   JOINT REPLACEMENT  07/25/04   right knee   JOINT REPLACEMENT  07/13/10   left knee   [place]  09/20/06   with intraoperative cholangiogram and right inguinal herniorrhaphy with mesh    LIGAMENT REPAIR Left 07/2013   shoulder   LITHOTRIPSY     x2   [place] FUSION/FORAMINOTOMY N/A 11/24/2020   Procedure:  Posterior Cervical Laminectomy Cervical three-four, Cervical four-five with lateral mass fusion;  Surgeon: [name], MD;  Location: [place];  Service: Neurosurgery;  Laterality: N/A;   REFRACTIVE SURGERY Bilateral    Right total hip replacement  4/14   SKIN CANCER DESTRUCTION     nose, ear   TONSILLECTOMY  as child   TOTAL HIP ARTHROPLASTY Left    URETHRAL DILATION  1991   [name]   Patient Active Problem List   Diagnosis Date Noted   Irregular heart beat 02/03/2022   Constipation 02/03/2022   Carpal tunnel syndrome 01/24/2022   Numbness of hand 01/24/2022   Ulnar neuropathy 01/24/2022   Skin lesion 01/16/2022   Hand pain, left 01/16/2022   History of COVID-19 12/05/2021   Benign paroxysmal positional vertigo 09/14/2021   Palpitations 07/25/2021   Cardiac murmur 07/25/2021   History of chicken pox 07/22/2021   History of hemorrhoids 07/22/2021   Hypertension 07/22/2021   COPD (chronic obstructive pulmonary disease) ([place]) 07/22/2021   History of kidney stones 07/22/2021   History of skin cancer 06/03/3006   Complication of anesthesia 07/22/2021   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[place]) 07/22/2021   Other abnormal glucose 07/22/2021   Spondylitis ([place]) 07/22/2021    Greater trochanteric pain syndrome 07/22/2021   Vertigo 07/08/2021   Hypokalemia 07/08/2021   Trochanteric bursitis of left hip 06/30/2021   Cancer ([place]) 04/11/2021   Lumbar radiculopathy 03/02/2021   Gait instability 03/02/2021   Hip pain 03/02/2021   Situational depression 03/02/2021   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[place]) 02/09/2021   Urinary retention 11/26/2020   Cervical myelopathy ([place]) 11/24/2020   Body mass index (BMI) 25.0-25.9, adult 11/02/2020   Lumbar spondylosis 11/02/2020   Lumbago of lumbar region with sciatica 10/21/2020   Cystoid macular edema of both eyes 07/28/2020   Status post cervical spinal fusion 07/09/2020   History of total bilateral knee replacement 07/08/2020   Aortic atherosclerosis ([place]) 03/05/2020   Emphysema, unspecified ([place]) 03/05/2020   Arthritis 03/04/2020   Diverticulosis 03/03/2020   Internal hemorrhoids 03/03/2020   Weight loss 03/03/2020   Heme positive stool 02/29/2020   Leg cramps 11/13/2019   Tibialis anterior tendon tear, nontraumatic 11/13/2019   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 07/29/2018   CPAP (continuous positive airway pressure) dependence 03/25/2018   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   Erectile dysfunction 06/20/2017   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 03/06/2017   Peripheral neuropathy 06/19/2016   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   Hearing loss 02/02/2016   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   Allergic rhinitis 02/02/2016   Atypical chest pain 11/24/2015   Preventative health care 11/24/2015   Onychomycosis 06/30/2015   Degenerative disc disease, lumbar 06/30/2015   Other allergic rhinitis 02/18/2015   Deviated nasal septum 02/18/2015   RLS (restless legs syndrome) 02/18/2015   GERD (gastroesophageal reflux disease) 09/18/2014   Hyperglycemia 03/03/2014   Rotator cuff tear 07/27/2013    Sleep apnea with use of continuous positive airway pressure (CPAP) 07/15/2013   Carotid stenosis 02/24/2013   Nonspecific abnormal electrocardiogram (ECG) (EKG) 01/06/2013   DJD (degenerative joint disease) of hip 12/24/2012   Lower GI bleed 12/12/2012   Right groin pain 11/29/2012   Ventral hernia 07/08/2012   High risk medication use 01/18/2012   Dyspnea 06/12/2011   Hyperlipidemia with target LDL less than 130 12/27/2010   Osteoarthrosis, unspecified whether generalized or localized, pelvic region and thigh 07/25/2010  Osteoarthrosis, hand 06/13/2010   Anemia 12/17/2009   Hypothyroidism 05/13/2009   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   Benign essential hypertension 05/13/2009   Right inguinal hernia 05/13/2009   HIATAL HERNIA 05/13/2009   NEPHROLITHIASIS 05/13/2009   ROSACEA 05/13/2009    PCP: [name], NP  REFERRING PROVIDER: [name], NP  REFERRING DIAG: R26.81 (ICD-10-CM) - Gait instability   THERAPY DIAG:  Unsteadiness on feet  Other abnormalities of gait and mobility  Muscle weakness (generalized)  Repeated falls  Other low back pain  RATIONALE FOR EVALUATION AND TREATMENT:  Rehabilitation  ONSET DATE:  Fall 01/12/22 (2 falls in past 6 months)  SUBJECTIVE:   SUBJECTIVE STATEMENT: Pt reports some R low back and leg pain, which he thinks is from "that sciatic nerve'.  PAIN:  Are you having pain? Yes: NPRS scale: 1/10 Pain location: R low back and leg Pain description: ache  PERTINENT HISTORY:  Aortic atherosclerosis; carotid stenosis; cervical myelopathy s/p ACDF 06/2020 and posterior cervical fusion 11/2020; peripheral neuropathy; lumbar DDD/spondylosis; lumbago with sciatica/lumbar radiculopathy; OA; B TKA; BTHA; L greater trochanteric pain syndrome; L RCR 2014; recent CTR surgery - early May; hearing loss; HTN; BPPV/vertigo; B sacroiliitis; skin cancer; COPD/emphysema; sleep apnea with CPAP; GERD; gout; HLD; LE cramps; RLS  PRECAUTIONS: None  WEIGHT  BEARING RESTRICTIONS: No  FALLS: Has patient fallen in last 6 months? Yes. 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 No acute intracranial abnormality.  2. No acute fracture or traumatic subluxation of the cervical spine. L wrist x-ray: 1. Mild dorsal wrist soft tissue swelling with small ossific density overlying the proximal carpal row. Correlate for triquetral fracture.  2. Moderate scaphotrapeziotrapezoid and mild first CMC joint osteoarthritis. L elbow x-ray: 1. No acute fracture or dislocation, left elbow.  2. Mild elevation of the anterior fat pad suggestive of a small joint effusion, which may be reactive in the setting of elbow osteoarthritis. L shoulder x-rays: No acute osseous abnormality.  COGNITION: Overall cognitive status: Within functional limits for tasks assessed and Impaired: Memory: Impaired: Short term Procedural   SENSATION: Peripheral neuropathy in B feet  COORDINATION: Mildly impaired with slow movements  EDEMA:  Mild to moderate edema in distal B LE  MUSCLE LENGTH: Hamstrings: mod tight R>L ITB: mod tight B Piriformis: mod tight L>R Hip flexors: mod tight B Quads: mod tight B Heelcord: mod tight B  POSTURE:  rounded shoulders, forward  head, and flexed trunk   LOWER EXTREMITY MMT:   Tested in sitting on eval 02/22/22& 03/22/22 MMT Right 02/22/22 Left 02/22/22 Right 03/22/22 Left 03/22/22  Hip flexion 3+ 4- 3+ 4-  Hip extension 3- 3- 3+ 3+  Hip abduction 4- 4 4 4+  Hip adduction '4 4 4 ' 4+  Hip internal rotation 3  3+ 3 3+  Hip external rotation 3+ 3+ 3+ 3+  Knee flexion 4- 4- 4 4  Knee extension 4- 4 4 4+  Ankle dorsiflexion 4- 4 4- 4  Ankle plantarflexion 3+  6 SLS HR 4 10 SLS HR 4+ 20 SLS HR Limited lift on last few reps 5 20 SLS HR  Ankle inversion 3+ 4- 3+ 4-  Ankle eversion 3+ 4- 3+ 4-  (Blank rows = not tested)  BED MOBILITY:  Sit to supine SBA Supine to sit SBA  TRANSFERS: Assistive device utilized: Single point cane and None  Sit to stand: Modified independence Stand to sit: Modified independence Chair to chair: Modified independence Floor:  NT  GAIT: Gait pattern: step through pattern, decreased stride length, decreased hip/knee flexion- Right, decreased hip/knee flexion- Left, decreased trunk rotation, and wide BOS Distance walked: 80 Assistive device utilized: Single point cane Level of assistance: SBA Comments: Gait speed: 2.10 ft/sec with SPC, 2.31 ft/sec w/o AD  RAMP: Level of Assistance:  NT Assistive device utilized:    Ramp Comments:   CURB:  Level of Assistance:  NT Assistive device utilized:    Curb Comments:   STAIRS:  Level of Assistance:  NT  Stair Negotiation Technique:   Number of Stairs:    Height of Stairs:   Comments:   FUNCTIONAL TESTS:  5 times sit to stand: 13.0 sec Timed up and go (TUG): 13.53 sec with SPC; 14.19 sec w/o AD 10 meter walk test: 15.60 sec with SPC; 13.04 sec w/o AD Berg Balance Scale: 39/56 - scores 37-45 indicate significant risk for falls (>80%) Dynamic Gait Index: 11/24; Scores of 19 or less are predictive of falls in older community living adults  PATIENT SURVEYS:  ABC scale 55% of self confidence; 45% impairment  TODAY'S TREATMENT:   04/03/22 Therapeutic Exercise: Nustep L4x30mn; L6x15m Heel/toe raise at counter 2x10 Mini squat at counter x 10 SLS with opp leg reach with SPC to colored dots 5x5 each LE STS x 10- cues for ant WS - LOB x 1 Step ups 4' x 10 bil BATCA knee extension 10# x 10 BATCA knee flexion 15# x 10  03/22/22 THERAPEUTIC EXERCISE: to improve flexibility, strength and mobility.  Verbal and tactile cues throughout for technique. NuStep L6 x 6 min Standing RTB row 10 x 3", 2 sets Standing RTB scap retraction + shoulder extension to neutral 10 x 3" Standing B RTB pallof press x 10 Seated B GTB clam 10 x 3" (band modified with loops at ends, wrapped around lower things and crossed on top with loops anchored around wrists) Seated R/L unilateral GTB clam 10 x 3" Seated GTB march x 10 - pt report green band feels a little to strong, therefore will continue with the red TB at home for now  THERAPEUTIC ACTIVITIES:  5 times sit to stand: 12.16 sec Timed up and go (TUG): 13.66 sec with SPC; 12.59 sec w/o AD 10 meter walk test: 11.31 sec with SPC; 11.25 sec w/o AD Gait speed: 2.90 ft/sec with SPC; 2.92 ft/sec w/o AD Dynamic Gait Index: 16/24; Scores of 19 or less are predictive of falls in older community living adults   03/27/22 Therapeutic Exercise: Nustep L6x6m50mSeated clams unilateral clam 20x YTB Seated marches with YTB 3x5 reps no UE support Seated rows 2x15 reps RTB Seated extension with RTB  15 reps Standing glute sets at wall 10 reps Standing hip abduction at counter 10x Standing marching at counter 10x  PATIENT EDUCATION: Education  details: progress with PT Person educated: Patient Education method: Explanation Education comprehension: verbalized understanding   HOME EXERCISE PROGRAM: Access Code: [email or web address] URL: [email or web address] Date: 03/22/2022 Prepared by: Annie Paras  Exercises - Standing Gastroc Stretch at Counter  - 1-2 x daily - 7 x weekly - 2-3 reps -  30 sec hold - Standing Soleus Stretch at Counter  - 1-2 x daily - 7 x weekly - 2-3 reps - 30 sec hold - Hip Flexor Stretch on Step  - 1-2 x daily - 7 x weekly - 2-3 reps - 30 sec hold - Side Stepping with Resistance at Ankles and Counter Support  - 1 x daily - 7 x weekly - 2 sets - 10 reps - Forward and Backward Step Over with Counter Support  - 1 x daily - 7 x weekly - 2 sets - 10 reps - Turning in Corner 360  - 1 x daily - 7 x weekly - 2 sets - 5 reps - Tandem Stance in Corner  - 1 x daily - 7 x weekly - 2 reps - 30 sec hold - Single Leg Stance with Support  - 1 x daily - 7 x weekly - 2 sets - 30 sec hold - Squat with Chair Touch  - 1 x daily - 7 x weekly - 2 sets - 10 reps - 3 sec hold - Seated Hamstring Stretch  - 1-2 x daily - 7 x weekly - 2 sets - 30 sec hold - Seated Hip Adductor Stretch  - 1-2 x daily - 7 x weekly - 3 reps - 30 sec hold - Seated Figure 4 Piriformis Stretch  - 1-2 x daily - 7 x weekly - 2 sets - 30 sec hold - Supine Piriformis Stretch with Foot on Ground  - 1-2 x daily - 7 x weekly - 2-3 reps - 30 sec hold - Supine Lower Trunk Rotation  - 1 x daily - 7 x weekly - 2 sets - 30 sec hold - Bent Knee Fallouts with Alternating Legs  - 1 x daily - 7 x weekly - 2 sets - 10 reps - 3 sec hold - Seated Hip Abduction with Resistance  - 1 x daily - 3-4 x weekly - 2 sets - 10 reps - 3-5 sec hold - Seated Single Leg Hip Abduction with Resistance  - 1 x daily - 3-4 x weekly - 2 sets - 10 reps - 3 sec hold - Seated March with Resistance  - 1 x daily - 3-4 x weekly - 2 sets - 10 reps - 3 sec hold - Seated Shoulder Row with Anchored Resistance  - 1 x daily - 3-4 x weekly - 2 sets - 10 reps - 3-5 hold hold  Patient Education - Check for Safety   ASSESSMENT:  CLINICAL IMPRESSION: Mr. Tudisco noted more weakness this morning in legs. Continued to progress strengthening to improve stability in joints and mobility. Cueing required throughout session to reinforce proper movements with  exercises. Provided cues for STS to include more ant WS to allow for easier mobility. Overall patient showed a good response to treatment and would continue to benefit from progressing strength and balance exercises to decrease risk for falls.  OBJECTIVE IMPAIRMENTS Abnormal gait, decreased activity tolerance, decreased balance, decreased endurance, decreased knowledge of condition, decreased knowledge of use of DME, decreased mobility, difficulty walking, decreased ROM, decreased strength, decreased safety awareness, increased fascial restrictions, impaired perceived functional ability, increased muscle spasms, impaired flexibility, improper body  mechanics, postural dysfunction, and pain.   ACTIVITY LIMITATIONS carrying, lifting, bending, standing, squatting, stairs, transfers, and locomotion level  PARTICIPATION LIMITATIONS: cleaning, laundry, interpersonal relationship, community activity, and yard work  PERSONAL FACTORS Age, Fitness, Past/current experiences, Time since onset of injury/illness/exacerbation, and 3+ comorbidities: Aortic atherosclerosis; carotid stenosis; cervical myelopathy s/p ACDF 06/2020 and posterior cervical fusion 11/2020; peripheral neuropathy; lumbar DDD/spondylosis; lumbago with sciatica/lumbar radiculopathy; OA; B TKA; BTHA; L greater trochanteric pain syndrome; L RCR 2014; recent CTR surgery - early May; hearing loss; HTN; BPPV/vertigo; B sacroiliitis; skin cancer; COPD/emphysema; sleep apnea with CPAP; GERD; gout; HLD; LE cramps; RLS  are also affecting patient's functional outcome.   REHAB POTENTIAL: Good  CLINICAL DECISION MAKING: Evolving/moderate complexity  EVALUATION COMPLEXITY: Moderate  GOALS: Goals reviewed with patient? Yes  SHORT TERM GOALS: Target date: 03/22/2022   Patient will be independent with initial HEP. Baseline:  Goal status: MET  03/22/22  2.  Patient will demonstrate decreased fall risk by scoring > 45/56 on Berg. Baseline: 39/56 Goal  status: IN PROGRESS  03/22/22 - Progressed to 43/56  3.  Patient will be educated on strategies to decrease risk of falls.  Baseline:  Goal status: MET  03/22/22 - Education provided last visit - Pt reports no concerns as he went through the home checklist other than the bathroom mat non-skid backing has disintegrated from repeated washing (he states he pushes it to the side while he is in the BR)   LONG TERM GOALS: Target date: 04/19/2022   Patient will be independent with advanced/ongoing HEP to improve outcomes and carryover.  Baseline:  Goal status: IN PROGRESS  2.  Patient will be able to ambulate 600' with LRAD with good safety to access community.  Baseline:  Goal status: IN PROGRESS  3.  Patient will be able to step up/down curb safely with LRAD for safety with community ambulation.  Baseline:  Goal status: IN PROGRESS   4.  Patient will demonstrate improved functional LE strength as demonstrated by overall MMT >/= 4 to 4+/5. Baseline:  Goal status: IN PROGRESS  03/22/22 - Progressing (refer to MMT chart)  5.  Patient will demonstrate at least 19/24 on DGI to improve gait stability and reduce risk for falls. Baseline: 11/24 Goal status: IN PROGRESS  6.  Patient will score >/= 52/56 on Berg Balance test to demonstrate lower risk of falls. (MCID= 8 points) .  Baseline: 39/56 (eval - 02/22/22); 43/56 (03/22/22) Goal status: IN PROGRESS  7.  Patient will report >67% of self confidence on ABC scale to demonstrate improved functional ability. Baseline: 55% self confidence; 45 % impairment Goal status: IN PROGRESS  8.  Patient will demonstrate gait speed of >/= 2.62 ft/sec (0.80 m/s) to be a safe community ambulator with decreased risk for recurrent falls.  Baseline: 2.10 ft/sec with SPC, 2.31 ft/sec w/o AD (eval); 2.90 ft/sec with SPC; 2.92 ft/sec w/o AD (03/31/22) Goal status: MET  03/31/22   PLAN: PT FREQUENCY: 2x/week  PT DURATION: 8 weeks  PLANNED INTERVENTIONS:  Therapeutic exercises, Therapeutic activity, Neuromuscular re-education, Balance training, Gait training, Patient/Family education, Joint mobilization, Stair training, Vestibular training, Canalith repositioning, Dry Needling, Electrical stimulation, Spinal manipulation, Cryotherapy, Moist heat, Taping, Ultrasound, Ionotophoresis 73m/ml Dexamethasone, Manual therapy, and Re-evaluation  PLAN FOR NEXT SESSION:  progress lumbopelvic/LE flexibility and strengthening; balance training; HEP review/update/modification as needed   BArtist Pais PTA 04/03/2022, 10:59 AM

## 2022-04-06 ENCOUNTER — Ambulatory Visit: Payer: Medicare HMO | Admitting: Physical Therapy

## 2022-04-06 DIAGNOSIS — M5459 Other low back pain: Secondary | ICD-10-CM

## 2022-04-06 DIAGNOSIS — M6281 Muscle weakness (generalized): Secondary | ICD-10-CM

## 2022-04-06 DIAGNOSIS — R2689 Other abnormalities of gait and mobility: Secondary | ICD-10-CM | POA: Diagnosis not present

## 2022-04-06 DIAGNOSIS — R2681 Unsteadiness on feet: Secondary | ICD-10-CM | POA: Diagnosis not present

## 2022-04-06 DIAGNOSIS — R296 Repeated falls: Secondary | ICD-10-CM

## 2022-04-06 NOTE — Therapy (Signed)
OUTPATIENT PHYSICAL THERAPY TREATMENT    Patient Name: [name] Pratt MRN: [id number] DOB:1941/06/14, 81 y.o., male Today's Date: 04/06/2022       PT End of Session - 04/06/22 0946     Visit Number 9    Number of Visits 17    Date for PT Re-Evaluation 04/19/22    Authorization Type [name]    Authorization Time Period 02/24/22 - 04/19/22    Authorization - Visit Number 8    Authorization - Number of Visits 16    Progress Note Due on Visit 17   PN completed on visit #7   PT Start Time 0946    PT Stop Time 1030    PT Time Calculation (min) 44 min    Activity Tolerance Patient tolerated treatment well    Behavior During Therapy [facility] for tasks assessed/performed                     Past Medical History:  Diagnosis Date   Allergic rhinitis 02/02/2016   Anemia 12/17/2009   Formatting of this note might be different from the original. Anemia  10/1 IMO update   Aortic atherosclerosis ([place]) 03/05/2020   Arthritis    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   Atypical chest pain 11/24/2015   Benign essential hypertension 05/13/2009   Qualifier: Diagnosis of  By: Larwance Sachs of this note might be different from the original. Hypertension   Benign paroxysmal positional vertigo 09/14/2021   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[place]) 07/22/2021   Body mass index (BMI) 25.0-25.9, adult 11/02/2020   Cancer (HCC)    skin cancer   Cardiac murmur 07/25/2021   Carotid stenosis    a. Carotid U/S 2/42: LICA < 35%, RICA 36-14%;  b.  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   Cystoid macular edema of both  eyes 07/28/2020   Degenerative disc disease, lumbar 06/30/2015   Deviated nasal septum 02/18/2015   Diverticulosis 03/03/2020   DJD (degenerative joint disease) of hip 12/24/2012   Dyspnea 06/12/2011   Emphysema, unspecified ([place]) 03/05/2020   Erectile dysfunction 06/20/2017   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[place]) 02/09/2021   Gait disorder 03/02/2021   GERD (gastroesophageal reflux disease)    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   Qualifier: Diagnosis of  By: Burnett Kanaris     Greater trochanteric pain syndrome 07/22/2021   Hand pain, left 01/16/2022   Hearing loss 02/02/2016   Heme positive stool 02/29/2020   HIATAL HERNIA 05/13/2009   Qualifier: Diagnosis of  By: Burnett Kanaris     High risk medication use 01/18/2012   Hip pain 03/02/2021   History of chicken pox    History of COVID-19 12/05/2021   History of hemorrhoids    History of kidney stones    History of skin cancer 07/22/2021   skin cancer   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 Cardiology - Dr Frances Nickels at [facility] cardiology  ICD-10  cut over   Hypertension    under control   Hypokalemia 07/08/2021   Hypothyroidism    Internal hemorrhoids 03/03/2020   Leg cramps 11/13/2019   Lower GI bleed 12/12/2012   Lumbago of lumbar region with sciatica 10/21/2020   Lumbar radiculopathy 03/02/2021   Lumbar spondylosis 11/02/2020   NEPHROLITHIASIS 05/13/2009   Qualifier: Diagnosis of  By: Burnett Kanaris     Nonspecific abnormal electrocardiogram (ECG) (EKG) 01/06/2013   Numbness of hand 01/24/2022   Onychomycosis 06/30/2015   Osteoarthrosis, 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 Osteoarthritis Of The Hand  10/1 IMO update   Osteoarthrosis, unspecified whether  generalized or localized, pelvic region and thigh 07/25/2010   Formatting of this note might be different from the original. Osteoarthritis Of The Hip   Other abnormal glucose 07/22/2021   Other allergic rhinitis 02/18/2015   Palpitations 07/25/2021   Peripheral neuropathy 06/19/2016   Preventative health care 11/24/2015   Right groin pain 11/29/2012   Right inguinal hernia 05/13/2009   Qualifier: Diagnosis of  By: Burnett Kanaris     RLS (restless legs syndrome) 02/18/2015   Rosacea    Rotator cuff tear 07/27/2013   Situational depression 03/02/2021   Skin lesion 01/16/2022   Sleep apnea with use of continuous positive airway pressure (CPAP) 07/15/2013   CPAP set to  7 cm water,  Residual AHi was 7.8  With no leak.  User time 6 hours.  479 days , not used only 12 days - highly compliant 06-23-13  .    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   Urinary retention 11/26/2020   Ventral hernia 07/08/2012   Vertigo 07/08/2021   Weight loss 03/03/2020   Past Surgical History:  Procedure Laterality Date   ABDOMINAL HERNIA REPAIR  07/15/12   Dr [name]   ANTERIOR CERVICAL DECOMP/DISCECTOMY FUSION N/A 07/09/2020   Procedure: ANTERIOR CERVICAL DECOMPRESSION AND FUSION CERVICAL THREE-FOUR.;  Surgeon: [name], MD;  Location: [place];  Service: Neurosurgery;  Laterality: N/A;  anterior   BROW LIFT  05/07/01   CYSTOSCOPY WITH URETEROSCOPY AND STENT PLACEMENT Right 01/28/2014   Procedure: CYSTOSCOPY WITH URETEROSCOPY, BASKET RETRIVAL AND  STENT PLACEMENT;  Surgeon: [name], MD;  Location: [facility];  Service: Urology;  Laterality: Right;   epidural injections      multiple procedures   ESOPHAGEAL DILATION  1993 and 1994   multiple times   EYE SURGERY Bilateral 03/25/10, 2012   cataract removal   HEMORRHOID SURGERY  2014   HIATAL HERNIA REPAIR  12/21/93   HOLMIUM LASER APPLICATION Right 2/95/1884   Procedure: HOLMIUM LASER APPLICATION;  Surgeon: [name], MD;  Location: [facility];  Service: Urology;  Laterality: Right;   INGUINAL HERNIA REPAIR Right 07/15/12   Dr [name], x2   JOINT REPLACEMENT  07/25/04   right knee   JOINT REPLACEMENT  07/13/10   left knee   [place]  09/20/06   with intraoperative cholangiogram and right inguinal herniorrhaphy with mesh    LIGAMENT REPAIR Left 07/2013   shoulder   LITHOTRIPSY     x2  NASAL SEPTUM SURGERY  1975   POSTERIOR CERVICAL FUSION/FORAMINOTOMY N/A 11/24/2020   Procedure: Posterior Cervical Laminectomy Cervical three-four, Cervical four-five with lateral mass fusion;  Surgeon: [name], MD;  Location: [place];  Service: Neurosurgery;  Laterality: N/A;   REFRACTIVE SURGERY Bilateral    Right total hip replacement  4/14   SKIN CANCER DESTRUCTION     nose, ear   TONSILLECTOMY  as child   TOTAL HIP ARTHROPLASTY Left    URETHRAL DILATION  1991   [name]   Patient Active Problem List   Diagnosis Date Noted   Irregular heart beat 02/03/2022   Constipation 02/03/2022   Carpal tunnel syndrome 01/24/2022   Numbness of hand 01/24/2022   Ulnar neuropathy 01/24/2022   Skin lesion 01/16/2022   Hand pain, left 01/16/2022   History of COVID-19 12/05/2021   Benign paroxysmal positional vertigo 09/14/2021   Palpitations 07/25/2021   Cardiac murmur 07/25/2021   History of chicken pox 07/22/2021   History of hemorrhoids 07/22/2021   Hypertension 07/22/2021   COPD (chronic obstructive pulmonary disease) ([place]) 07/22/2021   History of kidney stones 07/22/2021   History of skin cancer 96/78/9381   Complication of anesthesia 07/22/2021   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[place])  07/22/2021   Other abnormal glucose 07/22/2021   Spondylitis ([place]) 07/22/2021   Greater trochanteric pain syndrome 07/22/2021   Vertigo 07/08/2021   Hypokalemia 07/08/2021   Trochanteric bursitis of left hip 06/30/2021   Cancer ([place]) 04/11/2021   Lumbar radiculopathy 03/02/2021   Gait instability 03/02/2021   Hip pain 03/02/2021   Situational depression 03/02/2021   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[place]) 02/09/2021   Urinary retention 11/26/2020   Cervical myelopathy ([place]) 11/24/2020   Body mass index (BMI) 25.0-25.9, adult 11/02/2020   Lumbar spondylosis 11/02/2020   Lumbago of lumbar region with sciatica 10/21/2020   Cystoid macular edema of both eyes 07/28/2020   Status post cervical spinal fusion 07/09/2020   History of total bilateral knee replacement 07/08/2020   Aortic atherosclerosis ([place]) 03/05/2020   Emphysema, unspecified ([place]) 03/05/2020   Arthritis 03/04/2020   Diverticulosis 03/03/2020   Internal hemorrhoids 03/03/2020   Weight loss 03/03/2020   Heme positive stool 02/29/2020   Leg cramps 11/13/2019   Tibialis anterior tendon tear, nontraumatic 11/13/2019   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 07/29/2018   CPAP (continuous positive airway pressure) dependence 03/25/2018   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   Erectile dysfunction 06/20/2017   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 03/06/2017   Peripheral neuropathy 06/19/2016   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   Hearing loss 02/02/2016   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   Allergic rhinitis 02/02/2016   Atypical chest pain 11/24/2015   Preventative health care 11/24/2015   Onychomycosis 06/30/2015   Degenerative disc disease, lumbar 06/30/2015   Other allergic rhinitis 02/18/2015   Deviated nasal septum 02/18/2015   RLS (restless legs syndrome) 02/18/2015   GERD (gastroesophageal reflux  disease) 09/18/2014   Hyperglycemia 03/03/2014   Rotator cuff tear 07/27/2013   Sleep apnea with use of continuous positive airway pressure (CPAP) 07/15/2013   Carotid stenosis 02/24/2013   Nonspecific abnormal electrocardiogram (ECG) (EKG) 01/06/2013   DJD (degenerative joint disease) of hip 12/24/2012   Lower GI bleed 12/12/2012   Right groin pain 11/29/2012   Ventral hernia 07/08/2012   High risk medication use 01/18/2012   Dyspnea 06/12/2011   Hyperlipidemia with target LDL less than 130  12/27/2010   Osteoarthrosis, unspecified whether generalized or localized, pelvic region and thigh 07/25/2010   Osteoarthrosis, hand 06/13/2010   Anemia 12/17/2009   Hypothyroidism 05/13/2009   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   Benign essential hypertension 05/13/2009   Right inguinal hernia 05/13/2009   HIATAL HERNIA 05/13/2009   NEPHROLITHIASIS 05/13/2009   ROSACEA 05/13/2009    PCP: [name], NP  REFERRING PROVIDER: [name], NP  REFERRING DIAG: R26.81 (ICD-10-CM) - Gait instability   THERAPY DIAG:  Unsteadiness on feet  Other abnormalities of gait and mobility  Muscle weakness (generalized)  Repeated falls  Other low back pain  RATIONALE FOR EVALUATION AND TREATMENT:  Rehabilitation  ONSET DATE:  Fall 01/12/22 (2 falls in past 6 months)  SUBJECTIVE:   SUBJECTIVE STATEMENT: Pt reports sleeping with a pillow under his knees helps with his back and hip/leg pain.  PAIN:  Are you having pain? Yes: NPRS scale:  1/10 Pain location: R low back and R hip/groin Pain description: ache  PERTINENT HISTORY:  Aortic atherosclerosis; carotid stenosis; cervical myelopathy s/p ACDF 06/2020 and posterior cervical fusion 11/2020; peripheral neuropathy; lumbar DDD/spondylosis; lumbago with sciatica/lumbar radiculopathy; OA; B TKA; BTHA; L greater trochanteric pain syndrome; L RCR 2014; recent CTR surgery - early May; hearing loss; HTN; BPPV/vertigo; B sacroiliitis; skin cancer;  COPD/emphysema; sleep apnea with CPAP; GERD; gout; HLD; LE cramps; RLS  PRECAUTIONS: None  WEIGHT BEARING RESTRICTIONS: No  FALLS: Has patient fallen in last 6 months? Yes. 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 No acute intracranial abnormality.  2. No acute fracture or traumatic subluxation of the cervical spine. L wrist x-ray: 1. Mild dorsal wrist soft tissue swelling with small ossific density overlying the proximal carpal row. Correlate for triquetral fracture.  2. Moderate scaphotrapeziotrapezoid and mild first CMC joint osteoarthritis. L elbow x-ray: 1. No acute fracture or dislocation, left elbow.  2. Mild elevation of the anterior fat pad suggestive of a small joint effusion, which may be reactive in the setting of elbow osteoarthritis. L shoulder x-rays: No acute osseous abnormality.  COGNITION: Overall cognitive status: Within functional limits for tasks assessed and Impaired: Memory: Impaired: Short term Procedural   SENSATION: Peripheral neuropathy in B feet  COORDINATION: Mildly impaired with slow movements  EDEMA:  Mild to moderate edema in distal B LE  MUSCLE LENGTH: Hamstrings: mod tight R>L ITB: mod tight B Piriformis: mod tight L>R Hip  flexors: mod tight B Quads: mod tight B Heelcord: mod tight B  POSTURE:  rounded shoulders, forward head, and flexed trunk   LOWER EXTREMITY MMT:   Tested in sitting on eval 02/22/22& 03/22/22 MMT Right 02/22/22 Left 02/22/22 Right 03/22/22 Left 03/22/22  Hip flexion 3+ 4- 3+ 4-  Hip extension 3- 3- 3+ 3+  Hip  abduction 4- 4 4 4+  Hip adduction '4 4 4 ' 4+  Hip internal rotation 3 3+ 3 3+  Hip external rotation 3+ 3+ 3+ 3+  Knee flexion 4- 4- 4 4  Knee extension 4- 4 4 4+  Ankle dorsiflexion 4- 4 4- 4  Ankle plantarflexion 3+  6 SLS HR 4 10 SLS HR 4+ 20 SLS HR Limited lift on last few reps 5 20 SLS HR  Ankle inversion 3+ 4- 3+ 4-  Ankle eversion 3+ 4- 3+ 4-  (Blank rows = not tested)  BED MOBILITY:  Sit to supine SBA Supine to sit SBA  TRANSFERS: Assistive device utilized: Single point cane and None  Sit to stand: Modified independence Stand to sit: Modified independence Chair to chair: Modified independence Floor:  NT  GAIT: Gait pattern: step through pattern, decreased stride length, decreased hip/knee flexion- Right, decreased hip/knee flexion- Left, decreased trunk rotation, and wide BOS Distance walked: 80 Assistive device utilized: Single point cane Level of assistance: SBA Comments: Gait speed: 2.10 ft/sec with SPC, 2.31 ft/sec w/o AD  RAMP: Level of Assistance:  NT Assistive device utilized:    Ramp Comments:   CURB:  Level of Assistance:  NT Assistive device utilized:    Curb Comments:   STAIRS:  Level of Assistance:  NT  Stair Negotiation Technique:   Number of Stairs:    Height of Stairs:   Comments:   FUNCTIONAL TESTS:  5 times sit to stand: 13.0 sec Timed up and go (TUG): 13.53 sec with SPC; 14.19 sec w/o AD 10 meter walk test: 15.60 sec with SPC; 13.04 sec w/o AD Berg Balance Scale: 39/56 - scores 37-45 indicate significant risk for falls (>80%) Dynamic Gait Index: 11/24; Scores of 19 or less are predictive of falls in older community  living adults  PATIENT SURVEYS:  ABC scale 55% of self confidence; 45% impairment  TODAY'S TREATMENT:   04/06/22 THERAPEUTIC EXERCISE: to improve flexibility, strength and mobility.  Verbal and tactile cues throughout for technique. NuStep L6 x 6 min Bridge + RTB hip ABD isometric 10 x 3", 2 sets - intermittent cramping in L HS noted on 1st but alleviated with cues to press into flat foot rather than heels with lift TrA + RTB march 2 x 10 TrA + RTB bent knee fallouts 2 x 10 TrA + bent knee up/up/down/down to 90/90 position x 10 Hooklying dead bug x 10 L Hooklying HS stretch with strap 3 x 30 sec Standing RTB row 10 x 3", 2 sets - 2nd set in staggered stance Standing RTB scap retraction + shoulder extension to neutral 10 x 3", 2 sets - 2nd set in staggered stance Standing B RTB pallof press + short arc rotation x 10   04/03/22 Therapeutic Exercise: Nustep L4x74mn; L6x127m Heel/toe raise at counter 2x10 Mini squat at counter x 10 SLS with opp leg reach with SPC to colored dots 5x5 each LE STS x 10- cues for ant WS - LOB x 1 Step ups 4' x 10 bil BATCA knee extension 10# x 10 BATCA knee flexion 15# x 10   03/22/22 THERAPEUTIC EXERCISE: to improve flexibility, strength and mobility.  Verbal and tactile cues throughout for technique. NuStep L6 x 6 min Standing RTB row 10 x 3", 2 sets Standing RTB scap retraction + shoulder extension to neutral 10 x 3" Standing B RTB pallof press x 10 Seated B GTB clam 10 x 3" (band modified with loops at ends, wrapped around lower things  and crossed on top with loops anchored around wrists) Seated R/L unilateral GTB clam 10 x 3" Seated GTB march x 10 - pt report green band feels a little to strong, therefore will continue with the red TB at home for now  THERAPEUTIC ACTIVITIES:  5 times sit to stand: 12.16 sec Timed up and go (TUG): 13.66 sec with SPC; 12.59 sec w/o AD 10 meter walk test: 11.31 sec with SPC; 11.25 sec w/o AD Gait speed: 2.90  ft/sec with SPC; 2.92 ft/sec w/o AD Dynamic Gait Index: 16/24; Scores of 19 or less are predictive of falls in older community living adults   PATIENT EDUCATION: Education details:  pacing and posture with standing exercises Person educated: Patient Education method: Explanation Education comprehension: verbalized understanding   HOME EXERCISE PROGRAM: Access Code: [email or web address] URL: [email or web address] Date: 03/22/2022 Prepared by: Annie Paras  Exercises - Standing Gastroc Stretch at Counter  - 1-2 x daily - 7 x weekly - 2-3 reps - 30 sec hold - Standing Soleus Stretch at Counter  - 1-2 x daily - 7 x weekly - 2-3 reps - 30 sec hold - Hip Flexor Stretch on Step  - 1-2 x daily - 7 x weekly - 2-3 reps - 30 sec hold - Side Stepping with Resistance at Ankles and Counter Support  - 1 x daily - 7 x weekly - 2 sets - 10 reps - Forward and Backward Step Over with Counter Support  - 1 x daily - 7 x weekly - 2 sets - 10 reps - Turning in Corner 360  - 1 x daily - 7 x weekly - 2 sets - 5 reps - Tandem Stance in Corner  - 1 x daily - 7 x weekly - 2 reps - 30 sec hold - Single Leg Stance with Support  - 1 x daily - 7 x weekly - 2 sets - 30 sec hold - Squat with Chair Touch  - 1 x daily - 7 x weekly - 2 sets - 10 reps - 3 sec hold - Seated Hamstring Stretch  - 1-2 x daily - 7 x weekly - 2 sets - 30 sec hold - Seated Hip Adductor Stretch  - 1-2 x daily - 7 x weekly - 3 reps - 30 sec hold - Seated Figure 4 Piriformis Stretch  - 1-2 x daily - 7 x weekly - 2 sets - 30 sec hold - Supine Piriformis Stretch with Foot on Ground  - 1-2 x daily - 7 x weekly - 2-3 reps - 30 sec hold - Supine Lower Trunk Rotation  - 1 x daily - 7 x weekly - 2 sets - 30 sec hold - Bent Knee Fallouts with Alternating Legs  - 1 x daily - 7 x weekly - 2 sets - 10 reps - 3 sec hold - Seated Hip Abduction with Resistance  - 1 x daily - 3-4 x weekly - 2 sets - 10 reps - 3-5 sec hold - Seated Single Leg Hip Abduction  with Resistance  - 1 x daily - 3-4 x weekly - 2 sets - 10 reps - 3 sec hold - Seated March with Resistance  - 1 x daily - 3-4 x weekly - 2 sets - 10 reps - 3 sec hold - Seated Shoulder Row with Anchored Resistance  - 1 x daily - 3-4 x weekly - 2 sets - 10 reps - 3-5 hold hold  Patient Education - Check for Safety   ASSESSMENT:  CLINICAL  IMPRESSION: Karsin "[name]" continues to verbalize primary complaint of low back and LE weakness, therefore therapeutic exercises continuing to target core activation in conjunction with proximal LE strengthening as well as postural strengthening with increasing movement patterns and variable BOS to incorporate balance component to improve overall strength and stability. Cues necessary for upright posture with abdominal and glute muscle activation during standing exercises to avoid forward flexed posture. [name] is demonstrating good progress with PT but given ongoing weakness and balance deficits, I expect he will likely benefit from recert for extension of his POC at the end of the current authorization.  OBJECTIVE IMPAIRMENTS Abnormal gait, decreased activity tolerance, decreased balance, decreased endurance, decreased knowledge of condition, decreased knowledge of use of DME, decreased mobility, difficulty walking, decreased ROM, decreased strength, decreased safety awareness, increased fascial restrictions, impaired perceived functional ability, increased muscle spasms, impaired flexibility, improper body mechanics, postural dysfunction, and pain.   ACTIVITY LIMITATIONS carrying, lifting, bending, standing, squatting, stairs, transfers, and locomotion level  PARTICIPATION LIMITATIONS: cleaning, laundry, interpersonal relationship, community activity, and yard work  PERSONAL FACTORS Age, Fitness, Past/current experiences, Time since onset of injury/illness/exacerbation, and 3+ comorbidities: Aortic atherosclerosis; carotid stenosis; cervical myelopathy s/p ACDF  06/2020 and posterior cervical fusion 11/2020; peripheral neuropathy; lumbar DDD/spondylosis; lumbago with sciatica/lumbar radiculopathy; OA; B TKA; BTHA; L greater trochanteric pain syndrome; L RCR 2014; recent CTR surgery - early May; hearing loss; HTN; BPPV/vertigo; B sacroiliitis; skin cancer; COPD/emphysema; sleep apnea with CPAP; GERD; gout; HLD; LE cramps; RLS  are also affecting patient's functional outcome.   REHAB POTENTIAL: Good  CLINICAL DECISION MAKING: Evolving/moderate complexity  EVALUATION COMPLEXITY: Moderate  GOALS: Goals reviewed with patient? Yes  SHORT TERM GOALS: Target date: 03/22/2022   Patient will be independent with initial HEP. Baseline:  Goal status: MET  03/22/22  2.  Patient will demonstrate decreased fall risk by scoring > 45/56 on Berg. Baseline: 39/56 Goal status: IN PROGRESS  03/22/22 - Progressed to 43/56  3.  Patient will be educated on strategies to decrease risk of falls.  Baseline:  Goal status: MET  03/22/22 - Education provided last visit - Pt reports no concerns as he went through the home checklist other than the bathroom mat non-skid backing has disintegrated from repeated washing (he states he pushes it to the side while he is in the BR)   LONG TERM GOALS: Target date: 04/19/2022   Patient will be independent with advanced/ongoing HEP to improve outcomes and carryover.  Baseline:  Goal status: IN PROGRESS  2.  Patient will be able to ambulate 600' with LRAD with good safety to access community.  Baseline:  Goal status: IN PROGRESS  3.  Patient will be able to step up/down curb safely with LRAD for safety with community ambulation.  Baseline:  Goal status: IN PROGRESS   4.  Patient will demonstrate improved functional LE strength as demonstrated by overall MMT >/= 4 to 4+/5. Baseline:  Goal status: IN PROGRESS  03/22/22 - Progressing (refer to MMT chart)  5.  Patient will demonstrate at least 19/24 on DGI to improve gait stability and  reduce risk for falls. Baseline: 11/24 Goal status: IN PROGRESS  6.  Patient will score >/= 52/56 on Berg Balance test to demonstrate lower risk of falls. (MCID= 8 points) .  Baseline: 39/56 (eval - 02/22/22); 43/56 (03/22/22) Goal status: IN PROGRESS  7.  Patient will report >67% of self confidence on ABC scale to demonstrate improved functional ability. Baseline: 55% self confidence; 45 % impairment  Goal status: IN PROGRESS  8.  Patient will demonstrate gait speed of >/= 2.62 ft/sec (0.80 m/s) to be a safe community ambulator with decreased risk for recurrent falls.  Baseline: 2.10 ft/sec with SPC, 2.31 ft/sec w/o AD (eval); 2.90 ft/sec with SPC; 2.92 ft/sec w/o AD (03/31/22) Goal status: MET  03/31/22   PLAN: PT FREQUENCY: 2x/week  PT DURATION: 8 weeks  PLANNED INTERVENTIONS: Therapeutic exercises, Therapeutic activity, Neuromuscular re-education, Balance training, Gait training, Patient/Family education, Joint mobilization, Stair training, Vestibular training, Canalith repositioning, Dry Needling, Electrical stimulation, Spinal manipulation, Cryotherapy, Moist heat, Taping, Ultrasound, Ionotophoresis 23m/ml Dexamethasone, Manual therapy, and Re-evaluation  PLAN FOR NEXT SESSION:  progress lumbopelvic/LE flexibility and strengthening; balance training; HEP review/update/modification as needed   JPercival Spanish PT 04/06/2022, 10:47 AM

## 2022-04-11 ENCOUNTER — Ambulatory Visit: Payer: Medicare HMO | Attending: Family

## 2022-04-11 DIAGNOSIS — R2681 Unsteadiness on feet: Secondary | ICD-10-CM

## 2022-04-11 DIAGNOSIS — R296 Repeated falls: Secondary | ICD-10-CM

## 2022-04-11 DIAGNOSIS — R2689 Other abnormalities of gait and mobility: Secondary | ICD-10-CM | POA: Diagnosis not present

## 2022-04-11 DIAGNOSIS — M5459 Other low back pain: Secondary | ICD-10-CM | POA: Diagnosis not present

## 2022-04-11 DIAGNOSIS — M6281 Muscle weakness (generalized): Secondary | ICD-10-CM

## 2022-04-11 NOTE — Therapy (Signed)
OUTPATIENT PHYSICAL THERAPY TREATMENT    Patient Name: [name] Pratt MRN: [id number] DOB:25-May-1941, 81 y.o., male Today's Date: 04/11/2022       PT End of Session - 04/11/22 1534     Visit Number 10    Number of Visits 17    Date for PT Re-Evaluation 04/19/22    Authorization Type [name]    Authorization Time Period 02/24/22 - 04/19/22    Authorization - Visit Number 9    Authorization - Number of Visits 16    Progress Note Due on Visit 17   PN completed on visit #7   PT Start Time 1446    PT Stop Time 1530    PT Time Calculation (min) 44 min    Activity Tolerance Patient tolerated treatment well    Behavior During Therapy [facility] for tasks assessed/performed                      Past Medical History:  Diagnosis Date   Allergic rhinitis 02/02/2016   Anemia 12/17/2009   Formatting of this note might be different from the original. Anemia  10/1 IMO update   Aortic atherosclerosis ([place]) 03/05/2020   Arthritis    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   Atypical chest pain 11/24/2015   Benign essential hypertension 05/13/2009   Qualifier: Diagnosis of  By: Larwance Sachs of this note might be different from the original. Hypertension   Benign paroxysmal positional vertigo 09/14/2021   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[place]) 07/22/2021   Body mass index (BMI) 25.0-25.9, adult 11/02/2020   Cancer (HCC)    skin cancer   Cardiac murmur 07/25/2021   Carotid stenosis    a. Carotid U/S 2/83: LICA < 66%, RICA 29-47%;  b.  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   Cystoid macular edema of both  eyes 07/28/2020   Degenerative disc disease, lumbar 06/30/2015   Deviated nasal septum 02/18/2015   Diverticulosis 03/03/2020   DJD (degenerative joint disease) of hip 12/24/2012   Dyspnea 06/12/2011   Emphysema, unspecified ([place]) 03/05/2020   Erectile dysfunction 06/20/2017   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[place]) 02/09/2021   Gait disorder 03/02/2021   GERD (gastroesophageal reflux disease)    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   Qualifier: Diagnosis of  By: Burnett Kanaris     Greater trochanteric pain syndrome 07/22/2021   Hand pain, left 01/16/2022   Hearing loss 02/02/2016   Heme positive stool 02/29/2020   HIATAL HERNIA 05/13/2009   Qualifier: Diagnosis of  By: Burnett Kanaris     High risk medication use 01/18/2012   Hip pain 03/02/2021   History of chicken pox    History of COVID-19 12/05/2021   History of hemorrhoids    History of kidney stones    History of skin cancer 07/22/2021   skin cancer   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 Cardiology - Dr Frances Nickels at [facility] cardiology  ICD-10 cut over   Hypertension    under control   Hypokalemia 07/08/2021   Hypothyroidism    Internal hemorrhoids 03/03/2020   Leg cramps 11/13/2019   Lower GI bleed 12/12/2012   Lumbago of lumbar region with sciatica 10/21/2020   Lumbar radiculopathy 03/02/2021   Lumbar spondylosis 11/02/2020   NEPHROLITHIASIS 05/13/2009   Qualifier: Diagnosis of  By: Burnett Kanaris     Nonspecific abnormal electrocardiogram (ECG) (EKG) 01/06/2013   Numbness of hand 01/24/2022   Onychomycosis 06/30/2015   Osteoarthrosis, 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 Osteoarthritis Of The Hand  10/1 IMO update   Osteoarthrosis, unspecified whether  generalized or localized, pelvic region and thigh 07/25/2010   Formatting of this note might be different from the original. Osteoarthritis Of The Hip   Other abnormal glucose 07/22/2021   Other allergic rhinitis 02/18/2015   Palpitations 07/25/2021   Peripheral neuropathy 06/19/2016   Preventative health care 11/24/2015   Right groin pain 11/29/2012   Right inguinal hernia 05/13/2009   Qualifier: Diagnosis of  By: Burnett Kanaris     RLS (restless legs syndrome) 02/18/2015   Rosacea    Rotator cuff tear 07/27/2013   Situational depression 03/02/2021   Skin lesion 01/16/2022   Sleep apnea with use of continuous positive airway pressure (CPAP) 07/15/2013   CPAP set to  7 cm water,  Residual AHi was 7.8  With no leak.  User time 6 hours.  479 days , not used only 12 days - highly compliant 06-23-13  .    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   Urinary retention 11/26/2020   Ventral hernia 07/08/2012   Vertigo 07/08/2021   Weight loss 03/03/2020   Past Surgical History:  Procedure Laterality Date   ABDOMINAL HERNIA REPAIR  07/15/12   Dr [name]   ANTERIOR CERVICAL DECOMP/DISCECTOMY FUSION N/A 07/09/2020   Procedure: ANTERIOR CERVICAL DECOMPRESSION AND FUSION CERVICAL THREE-FOUR.;  Surgeon: [name], MD;  Location: [place];  Service: Neurosurgery;  Laterality: N/A;  anterior   BROW LIFT  05/07/01   CYSTOSCOPY WITH URETEROSCOPY AND STENT PLACEMENT Right 01/28/2014   Procedure: CYSTOSCOPY WITH URETEROSCOPY, BASKET RETRIVAL AND  STENT PLACEMENT;  Surgeon: [name], MD;  Location: [facility];  Service: Urology;  Laterality: Right;   epidural injections      multiple procedures   ESOPHAGEAL DILATION  1993 and 1994   multiple times   EYE SURGERY Bilateral 03/25/10, 2012   cataract removal   HEMORRHOID SURGERY  2014   HIATAL HERNIA REPAIR  12/21/93   HOLMIUM LASER APPLICATION Right 3/79/0240   Procedure: HOLMIUM LASER APPLICATION;  Surgeon: [name], MD;  Location: [facility];  Service: Urology;  Laterality: Right;   INGUINAL HERNIA REPAIR Right 07/15/12   Dr [name], x2   JOINT REPLACEMENT  07/25/04   right knee   JOINT REPLACEMENT  07/13/10   left knee   [place]  09/20/06   with intraoperative cholangiogram and right inguinal herniorrhaphy with mesh    LIGAMENT REPAIR Left 07/2013   shoulder   LITHOTRIPSY     x2  NASAL SEPTUM SURGERY  1975   POSTERIOR CERVICAL FUSION/FORAMINOTOMY N/A 11/24/2020   Procedure: Posterior Cervical Laminectomy Cervical three-four, Cervical four-five with lateral mass fusion;  Surgeon: [name], MD;  Location: [place];  Service: Neurosurgery;  Laterality: N/A;   REFRACTIVE SURGERY Bilateral    Right total hip replacement  4/14   SKIN CANCER DESTRUCTION     nose, ear   TONSILLECTOMY  as child   TOTAL HIP ARTHROPLASTY Left    URETHRAL DILATION  1991   [name]   Patient Active Problem List   Diagnosis Date Noted   Irregular heart beat 02/03/2022   Constipation 02/03/2022   Carpal tunnel syndrome 01/24/2022   Numbness of hand 01/24/2022   Ulnar neuropathy 01/24/2022   Skin lesion 01/16/2022   Hand pain, left 01/16/2022   History of COVID-19 12/05/2021   Benign paroxysmal positional vertigo 09/14/2021   Palpitations 07/25/2021   Cardiac murmur 07/25/2021   History of chicken pox 07/22/2021   History of hemorrhoids 07/22/2021   Hypertension 07/22/2021   COPD (chronic obstructive pulmonary disease) ([place]) 07/22/2021   History of kidney stones 07/22/2021   History of skin cancer 92/92/4462   Complication of anesthesia 07/22/2021   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[place])  07/22/2021   Other abnormal glucose 07/22/2021   Spondylitis ([place]) 07/22/2021   Greater trochanteric pain syndrome 07/22/2021   Vertigo 07/08/2021   Hypokalemia 07/08/2021   Trochanteric bursitis of left hip 06/30/2021   Cancer ([place]) 04/11/2021   Lumbar radiculopathy 03/02/2021   Gait instability 03/02/2021   Hip pain 03/02/2021   Situational depression 03/02/2021   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[place]) 02/09/2021   Urinary retention 11/26/2020   Cervical myelopathy ([place]) 11/24/2020   Body mass index (BMI) 25.0-25.9, adult 11/02/2020   Lumbar spondylosis 11/02/2020   Lumbago of lumbar region with sciatica 10/21/2020   Cystoid macular edema of both eyes 07/28/2020   Status post cervical spinal fusion 07/09/2020   History of total bilateral knee replacement 07/08/2020   Aortic atherosclerosis ([place]) 03/05/2020   Emphysema, unspecified ([place]) 03/05/2020   Arthritis 03/04/2020   Diverticulosis 03/03/2020   Internal hemorrhoids 03/03/2020   Weight loss 03/03/2020   Heme positive stool 02/29/2020   Leg cramps 11/13/2019   Tibialis anterior tendon tear, nontraumatic 11/13/2019   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 07/29/2018   CPAP (continuous positive airway pressure) dependence 03/25/2018   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   Erectile dysfunction 06/20/2017   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 03/06/2017   Peripheral neuropathy 06/19/2016   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   Hearing loss 02/02/2016   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   Allergic rhinitis 02/02/2016   Atypical chest pain 11/24/2015   Preventative health care 11/24/2015   Onychomycosis 06/30/2015   Degenerative disc disease, lumbar 06/30/2015   Other allergic rhinitis 02/18/2015   Deviated nasal septum 02/18/2015   RLS (restless legs syndrome) 02/18/2015   GERD (gastroesophageal reflux  disease) 09/18/2014   Hyperglycemia 03/03/2014   Rotator cuff tear 07/27/2013   Sleep apnea with use of continuous positive airway pressure (CPAP) 07/15/2013   Carotid stenosis 02/24/2013   Nonspecific abnormal electrocardiogram (ECG) (EKG) 01/06/2013   DJD (degenerative joint disease) of hip 12/24/2012   Lower GI bleed 12/12/2012   Right groin pain 11/29/2012   Ventral hernia 07/08/2012   High risk medication use 01/18/2012   Dyspnea 06/12/2011   Hyperlipidemia with target LDL less than 130  12/27/2010   Osteoarthrosis, unspecified whether generalized or localized, pelvic region and thigh 07/25/2010   Osteoarthrosis, hand 06/13/2010   Anemia 12/17/2009   Hypothyroidism 05/13/2009   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   Benign essential hypertension 05/13/2009   Right inguinal hernia 05/13/2009   HIATAL HERNIA 05/13/2009   NEPHROLITHIASIS 05/13/2009   ROSACEA 05/13/2009    PCP: [name], NP  REFERRING PROVIDER: [name], NP  REFERRING DIAG: R26.81 (ICD-10-CM) - Gait instability   THERAPY DIAG:  Unsteadiness on feet  Other abnormalities of gait and mobility  Muscle weakness (generalized)  Repeated falls  Other low back pain  RATIONALE FOR EVALUATION AND TREATMENT:  Rehabilitation  ONSET DATE:  Fall 01/12/22 (2 falls in past 6 months)  SUBJECTIVE:   SUBJECTIVE STATEMENT: Pt reports having to go to [employer] earlier to get car worked on, moved around a lot and also helped his son in yard earlier this morning.   PAIN:  Are you having pain? No  PERTINENT HISTORY:  Aortic atherosclerosis; carotid stenosis; cervical myelopathy s/p ACDF 06/2020 and posterior cervical fusion 11/2020; peripheral neuropathy; lumbar DDD/spondylosis; lumbago with sciatica/lumbar radiculopathy; OA; B TKA; BTHA; L greater trochanteric pain syndrome; L RCR 2014; recent CTR surgery - early May; hearing loss; HTN; BPPV/vertigo; B sacroiliitis; skin cancer; COPD/emphysema; sleep apnea with CPAP; GERD;  gout; HLD; LE cramps; RLS  PRECAUTIONS: None  WEIGHT BEARING RESTRICTIONS: No  FALLS: Has patient fallen in last 6 months? Yes. 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 No acute intracranial abnormality.  2. No acute fracture or traumatic subluxation of the cervical spine. L wrist x-ray: 1. Mild dorsal wrist soft tissue swelling with small ossific density overlying the proximal carpal row. Correlate for triquetral fracture.  2. Moderate scaphotrapeziotrapezoid and mild first CMC joint osteoarthritis. L elbow x-ray: 1. No acute fracture or dislocation, left elbow.  2. Mild elevation of the anterior fat pad suggestive of a small joint effusion, which may be reactive in the setting of elbow osteoarthritis. L shoulder x-rays: No acute osseous abnormality.  COGNITION: Overall cognitive status: Within functional limits for tasks assessed and Impaired: Memory: Impaired: Short term Procedural   SENSATION: Peripheral neuropathy in B feet  COORDINATION: Mildly impaired with slow movements  EDEMA:  Mild to moderate edema in distal B LE  MUSCLE LENGTH: Hamstrings: mod tight R>L ITB: mod tight B Piriformis: mod tight L>R Hip flexors: mod tight B Quads: mod tight B Heelcord:  mod tight B  POSTURE:  rounded shoulders, forward head, and flexed trunk   LOWER EXTREMITY MMT:   Tested in sitting on eval 02/22/22& 03/22/22 MMT Right 02/22/22 Left 02/22/22 Right 03/22/22 Left 03/22/22  Hip flexion 3+ 4- 3+ 4-  Hip extension 3- 3- 3+ 3+  Hip abduction 4- 4  4 4+  Hip adduction '4 4 4 ' 4+  Hip internal rotation 3 3+ 3 3+  Hip external rotation 3+ 3+ 3+ 3+  Knee flexion 4- 4- 4 4  Knee extension 4- 4 4 4+  Ankle dorsiflexion 4- 4 4- 4  Ankle plantarflexion 3+  6 SLS HR 4 10 SLS HR 4+ 20 SLS HR Limited lift on last few reps 5 20 SLS HR  Ankle inversion 3+ 4- 3+ 4-  Ankle eversion 3+ 4- 3+ 4-  (Blank rows = not tested)  BED MOBILITY:  Sit to supine SBA Supine to sit SBA  TRANSFERS: Assistive device utilized: Single point cane and None  Sit to stand: Modified independence Stand to sit: Modified independence Chair to chair: Modified independence Floor:  NT  GAIT: Gait pattern: step through pattern, decreased stride length, decreased hip/knee flexion- Right, decreased hip/knee flexion- Left, decreased trunk rotation, and wide BOS Distance walked: 80 Assistive device utilized: Single point cane Level of assistance: SBA Comments: Gait speed: 2.10 ft/sec with SPC, 2.31 ft/sec w/o AD  RAMP: Level of Assistance:  NT Assistive device utilized:    Ramp Comments:   CURB:  Level of Assistance:  NT Assistive device utilized:    Curb Comments:   STAIRS:  Level of Assistance:  NT  Stair Negotiation Technique:   Number of Stairs:    Height of Stairs:   Comments:   FUNCTIONAL TESTS:  5 times sit to stand: 13.0 sec Timed up and go (TUG): 13.53 sec with SPC; 14.19 sec w/o AD 10 meter walk test: 15.60 sec with SPC; 13.04 sec w/o AD Berg Balance Scale: 39/56 - scores 37-45 indicate significant risk for falls (>80%) Dynamic Gait Index: 11/24; Scores of 19 or less are predictive of falls in older community living adults  PATIENT SURVEYS:  ABC scale 55% of  self confidence; 45% impairment  TODAY'S TREATMENT:  04/11/22 Therapeutic Exercise: Nustep L5x86mn Heel/toe raise x 10 at counter Standing chops with RTB x 10 each way Standing lat pull with RTB x 20 Standing shld ext with RTB x 20  Nuero Re-ed: SLS with opp leg reach to colored dots 5 ways each side 5x Tandem walk + retro gait at counter top 3x down/back - cues for heel/toe pattern with tandem walk   04/06/22 THERAPEUTIC EXERCISE: to improve flexibility, strength and mobility.  Verbal and tactile cues throughout for technique. NuStep L6 x 6 min Bridge + RTB hip ABD isometric 10 x 3", 2 sets - intermittent cramping in L HS noted on 1st but alleviated with cues to press into flat foot rather than heels with lift TrA + RTB march 2 x 10 TrA + RTB bent knee fallouts 2 x 10 TrA + bent knee up/up/down/down to 90/90 position x 10 Hooklying dead bug x 10 L Hooklying HS stretch with strap 3 x 30 sec Standing RTB row 10 x 3", 2 sets - 2nd set in staggered stance Standing RTB scap retraction + shoulder extension to neutral 10 x 3", 2 sets - 2nd set in staggered stance Standing B RTB pallof press + short arc rotation x 10   04/03/22 Therapeutic Exercise: Nustep L4x551m; L6x1m62mHeel/toe raise at counter 2x10 Mini squat at counter x 10 SLS with opp leg reach with SPC to colored dots 5x5 each LE STS x 10- cues for ant WS - LOB x 1 Step ups 4' x 10 bil BATCA knee extension 10# x 10 BATCA knee flexion 15# x 10  PATIENT EDUCATION: Education details:  pacing and posture with standing exercises Person educated: Patient Education method: Explanation Education comprehension: verbalized understanding   HOME EXERCISE PROGRAM: Access Code: [email or web address] URL: [email or web address] Date: 03/22/2022 Prepared by: Annie Paras  Exercises - Standing Gastroc Stretch at Counter  - 1-2 x daily - 7 x weekly - 2-3 reps - 30 sec hold - Standing Soleus Stretch at Counter  - 1-2 x daily  - 7 x weekly - 2-3 reps - 30 sec hold - Hip Flexor Stretch on Step  - 1-2 x daily - 7 x weekly - 2-3 reps - 30 sec hold - Side Stepping with Resistance at Ankles and Counter Support  - 1 x daily - 7 x weekly - 2 sets - 10 reps - Forward and Backward Step Over with Counter Support  - 1 x daily - 7 x weekly - 2 sets - 10 reps - Turning in Corner 360  - 1 x daily - 7 x weekly - 2 sets - 5 reps - Tandem Stance in Corner  - 1 x daily - 7 x weekly - 2 reps - 30 sec hold - Single Leg Stance with Support  - 1 x daily - 7 x weekly - 2 sets - 30 sec hold - Squat with Chair Touch  - 1 x daily - 7 x weekly - 2 sets - 10 reps - 3 sec hold - Seated Hamstring Stretch  - 1-2 x daily - 7 x weekly - 2 sets - 30 sec hold - Seated Hip Adductor Stretch  - 1-2 x daily - 7 x weekly - 3 reps - 30 sec hold - Seated Figure 4 Piriformis Stretch  - 1-2 x daily - 7 x weekly - 2 sets - 30 sec hold - Supine Piriformis Stretch with Foot on Ground  - 1-2 x daily - 7 x weekly - 2-3 reps - 30 sec hold - Supine Lower Trunk Rotation  - 1 x daily - 7 x weekly - 2 sets - 30 sec hold - Bent Knee Fallouts with Alternating Legs  - 1 x daily - 7 x weekly - 2 sets - 10 reps - 3 sec hold - Seated Hip Abduction with Resistance  - 1 x daily - 3-4 x weekly - 2 sets - 10 reps - 3-5 sec hold - Seated Single Leg Hip Abduction with Resistance  - 1 x daily - 3-4 x weekly - 2 sets - 10 reps - 3 sec hold - Seated March with Resistance  - 1 x daily - 3-4 x weekly - 2 sets - 10 reps - 3 sec hold - Seated Shoulder Row with Anchored Resistance  - 1 x daily - 3-4 x weekly - 2 sets - 10 reps - 3-5 hold hold  Patient Education - Check for Safety   ASSESSMENT:  CLINICAL IMPRESSION: Good response to treatment. Continued progressing balance training working to improve proprioception and kinesthetic awareness. CGA required with dot reaches and tandem walk to maintain upright stability. Instruction given with toe raise to avoid hip hinge and keep upright  posture. Pt demonstrated good ability to complete interventions and would benefit from a recert at the end of this POC.  OBJECTIVE IMPAIRMENTS Abnormal gait, decreased activity tolerance, decreased balance, decreased endurance, decreased knowledge of condition, decreased knowledge of use of DME, decreased mobility, difficulty walking, decreased ROM, decreased strength, decreased safety awareness, increased fascial restrictions, impaired perceived functional ability, increased muscle spasms, impaired flexibility, improper body mechanics, postural  dysfunction, and pain.   ACTIVITY LIMITATIONS carrying, lifting, bending, standing, squatting, stairs, transfers, and locomotion level  PARTICIPATION LIMITATIONS: cleaning, laundry, interpersonal relationship, community activity, and yard work  PERSONAL FACTORS Age, Fitness, Past/current experiences, Time since onset of injury/illness/exacerbation, and 3+ comorbidities: Aortic atherosclerosis; carotid stenosis; cervical myelopathy s/p ACDF 06/2020 and posterior cervical fusion 11/2020; peripheral neuropathy; lumbar DDD/spondylosis; lumbago with sciatica/lumbar radiculopathy; OA; B TKA; BTHA; L greater trochanteric pain syndrome; L RCR 2014; recent CTR surgery - early May; hearing loss; HTN; BPPV/vertigo; B sacroiliitis; skin cancer; COPD/emphysema; sleep apnea with CPAP; GERD; gout; HLD; LE cramps; RLS  are also affecting patient's functional outcome.   REHAB POTENTIAL: Good  CLINICAL DECISION MAKING: Evolving/moderate complexity  EVALUATION COMPLEXITY: Moderate  GOALS: Goals reviewed with patient? Yes  SHORT TERM GOALS: Target date: 03/22/2022   Patient will be independent with initial HEP. Baseline:  Goal status: MET  03/22/22  2.  Patient will demonstrate decreased fall risk by scoring > 45/56 on Berg. Baseline: 39/56 Goal status: IN PROGRESS  03/22/22 - Progressed to 43/56  3.  Patient will be educated on strategies to decrease risk of falls.   Baseline:  Goal status: MET  03/22/22 - Education provided last visit - Pt reports no concerns as he went through the home checklist other than the bathroom mat non-skid backing has disintegrated from repeated washing (he states he pushes it to the side while he is in the BR)   LONG TERM GOALS: Target date: 04/19/2022   Patient will be independent with advanced/ongoing HEP to improve outcomes and carryover.  Baseline:  Goal status: IN PROGRESS  2.  Patient will be able to ambulate 600' with LRAD with good safety to access community.  Baseline:  Goal status: IN PROGRESS  3.  Patient will be able to step up/down curb safely with LRAD for safety with community ambulation.  Baseline:  Goal status: IN PROGRESS   4.  Patient will demonstrate improved functional LE strength as demonstrated by overall MMT >/= 4 to 4+/5. Baseline:  Goal status: IN PROGRESS  03/22/22 - Progressing (refer to MMT chart)  5.  Patient will demonstrate at least 19/24 on DGI to improve gait stability and reduce risk for falls. Baseline: 11/24 Goal status: IN PROGRESS  6.  Patient will score >/= 52/56 on Berg Balance test to demonstrate lower risk of falls. (MCID= 8 points) .  Baseline: 39/56 (eval - 02/22/22); 43/56 (03/22/22) Goal status: IN PROGRESS  7.  Patient will report >67% of self confidence on ABC scale to demonstrate improved functional ability. Baseline: 55% self confidence; 45 % impairment Goal status: IN PROGRESS  8.  Patient will demonstrate gait speed of >/= 2.62 ft/sec (0.80 m/s) to be a safe community ambulator with decreased risk for recurrent falls.  Baseline: 2.10 ft/sec with SPC, 2.31 ft/sec w/o AD (eval); 2.90 ft/sec with SPC; 2.92 ft/sec w/o AD (03/31/22) Goal status: MET  03/31/22   PLAN: PT FREQUENCY: 2x/week  PT DURATION: 8 weeks  PLANNED INTERVENTIONS: Therapeutic exercises, Therapeutic activity, Neuromuscular re-education, Balance training, Gait training, Patient/Family education,  Joint mobilization, Stair training, Vestibular training, Canalith repositioning, Dry Needling, Electrical stimulation, Spinal manipulation, Cryotherapy, Moist heat, Taping, Ultrasound, Ionotophoresis 68m/ml Dexamethasone, Manual therapy, and Re-evaluation  PLAN FOR NEXT SESSION:  progress lumbopelvic/LE flexibility and strengthening; balance training; HEP review/update/modification as needed   BArtist Pais PTA 04/11/2022, 3:35 PM

## 2022-04-16 ENCOUNTER — Other Ambulatory Visit: Payer: Self-pay | Admitting: Family

## 2022-04-17 ENCOUNTER — Ambulatory Visit: Payer: Medicare HMO | Admitting: Physical Therapy

## 2022-04-17 ENCOUNTER — Encounter: Payer: Self-pay | Admitting: Physical Therapy

## 2022-04-17 DIAGNOSIS — M5459 Other low back pain: Secondary | ICD-10-CM | POA: Diagnosis not present

## 2022-04-17 DIAGNOSIS — M6281 Muscle weakness (generalized): Secondary | ICD-10-CM

## 2022-04-17 DIAGNOSIS — R296 Repeated falls: Secondary | ICD-10-CM

## 2022-04-17 DIAGNOSIS — R2681 Unsteadiness on feet: Secondary | ICD-10-CM

## 2022-04-17 DIAGNOSIS — R2689 Other abnormalities of gait and mobility: Secondary | ICD-10-CM | POA: Diagnosis not present

## 2022-04-17 NOTE — Therapy (Signed)
OUTPATIENT PHYSICAL THERAPY TREATMENT / PROGRESS NOTE / RE-CERTIFICATION   Patient Name: [name] Pratt MRN: [id number] DOB:September 17, 1940, 81 y.o., male Today's Date: 04/17/2022  Progress Note  Reporting Period 03/31/2022 to 04/17/2022  See note below for Objective Data and Assessment of Progress/Goals.       PT End of Session - 04/17/22 1432     Visit Number 11    Number of Visits 23    Date for PT Re-Evaluation 05/29/22    Authorization Type [name]    Authorization Time Period 02/24/22 - 04/19/22    Authorization - Visit Number 10    Authorization - Number of Visits 16    Progress Note Due on Visit 21   Recert completed on visit #11 - 04/17/22   PT Start Time 1432    PT Stop Time 1530    PT Time Calculation (min) 58 min    Activity Tolerance Patient tolerated treatment well    Behavior During Therapy [facility] for tasks assessed/performed                       Past Medical History:  Diagnosis Date   Allergic rhinitis 02/02/2016   Anemia 12/17/2009   Formatting of this note might be different from the original. Anemia  10/1 IMO update   Aortic atherosclerosis ([place]) 03/05/2020   Arthritis    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   Atypical chest pain 11/24/2015   Benign essential hypertension 05/13/2009   Qualifier: Diagnosis of  By: Larwance Sachs of this note might be different from the original. Hypertension   Benign paroxysmal positional vertigo 09/14/2021   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[place]) 07/22/2021   Body mass index (BMI) 25.0-25.9, adult 11/02/2020   Cancer ([place])    skin cancer   Cardiac murmur 07/25/2021   Carotid stenosis    a. Carotid U/S 4/82: LICA < 50%, RICA 03-70%;  b.  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   Cystoid macular edema of both eyes 07/28/2020   Degenerative disc disease, lumbar 06/30/2015   Deviated nasal septum 02/18/2015   Diverticulosis 03/03/2020   DJD (degenerative joint disease) of hip 12/24/2012   Dyspnea 06/12/2011   Emphysema, unspecified ([place]) 03/05/2020   Erectile dysfunction 06/20/2017   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[place]) 02/09/2021   Gait disorder 03/02/2021   GERD (gastroesophageal reflux disease)    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   Qualifier: Diagnosis of  By: Burnett Kanaris     Greater trochanteric pain syndrome 07/22/2021   Hand pain, left 01/16/2022   Hearing loss 02/02/2016   Heme positive stool 02/29/2020   HIATAL HERNIA 05/13/2009   Qualifier: Diagnosis of  By: Burnett Kanaris     High risk medication use 01/18/2012   Hip pain 03/02/2021   History of chicken pox    History of COVID-19 12/05/2021   History of hemorrhoids    History of kidney stones    History of skin cancer 07/22/2021   skin cancer   History of total bilateral knee replacement 07/08/2020   Hyperglycemia 03/03/2014   Hyperlipidemia  with target LDL less than 130 12/27/2010   Formatting of this note might be different from the original. Cardiology - Dr Frances Nickels at [facility] cardiology  ICD-10 cut over   Hypertension    under control   Hypokalemia 07/08/2021   Hypothyroidism    Internal hemorrhoids 03/03/2020   Leg cramps 11/13/2019   Lower GI bleed 12/12/2012   Lumbago of lumbar region with sciatica 10/21/2020   Lumbar radiculopathy 03/02/2021   Lumbar spondylosis 11/02/2020   NEPHROLITHIASIS 05/13/2009   Qualifier: Diagnosis of  By: Burnett Kanaris     Nonspecific abnormal electrocardiogram (ECG) (EKG) 01/06/2013   Numbness of hand 01/24/2022   Onychomycosis 06/30/2015    Osteoarthrosis, 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 Osteoarthritis Of The Hand  10/1 IMO update   Osteoarthrosis, unspecified whether generalized or localized, pelvic region and thigh 07/25/2010   Formatting of this note might be different from the original. Osteoarthritis Of The Hip   Other abnormal glucose 07/22/2021   Other allergic rhinitis 02/18/2015   Palpitations 07/25/2021   Peripheral neuropathy 06/19/2016   Preventative health care 11/24/2015   Right groin pain 11/29/2012   Right inguinal hernia 05/13/2009   Qualifier: Diagnosis of  By: Burnett Kanaris     RLS (restless legs syndrome) 02/18/2015   Rosacea    Rotator cuff tear 07/27/2013   Situational depression 03/02/2021   Skin lesion 01/16/2022   Sleep apnea with use of continuous positive airway pressure (CPAP) 07/15/2013   CPAP set to  7 cm water,  Residual AHi was 7.8  With no leak.  User time 6 hours.  479 days , not used only 12 days - highly compliant 06-23-13  .    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   Urinary retention 11/26/2020   Ventral hernia 07/08/2012   Vertigo 07/08/2021   Weight loss 03/03/2020   Past Surgical History:  Procedure Laterality Date   ABDOMINAL HERNIA REPAIR  07/15/12   Dr [name]   ANTERIOR CERVICAL DECOMP/DISCECTOMY FUSION N/A 07/09/2020   Procedure: ANTERIOR CERVICAL DECOMPRESSION AND FUSION CERVICAL THREE-FOUR.;  Surgeon: [name], MD;  Location: [place];  Service: Neurosurgery;  Laterality: N/A;  anterior   BROW LIFT  05/07/01   CYSTOSCOPY WITH URETEROSCOPY AND STENT PLACEMENT Right 01/28/2014   Procedure:  CYSTOSCOPY WITH URETEROSCOPY, BASKET RETRIVAL AND  STENT PLACEMENT;  Surgeon: [name], MD;  Location: [facility];  Service: Urology;  Laterality: Right;   epidural injections     multiple procedures   ESOPHAGEAL DILATION  1993 and 1994   multiple times   EYE SURGERY Bilateral 03/25/10, 2012   cataract removal   HEMORRHOID SURGERY  2014   HIATAL HERNIA REPAIR  12/21/93   HOLMIUM LASER APPLICATION Right 05/10/9406   Procedure: HOLMIUM LASER APPLICATION;  Surgeon: [name], MD;  Location: [facility];  Service: Urology;  Laterality: Right;   INGUINAL HERNIA REPAIR Right 07/15/12   Dr [name], x2   JOINT REPLACEMENT  07/25/04   right knee   JOINT REPLACEMENT  07/13/10   left knee   [place]  09/20/06  with intraoperative cholangiogram and right inguinal herniorrhaphy with mesh    LIGAMENT REPAIR Left 07/2013   shoulder   LITHOTRIPSY     x2   NASAL SEPTUM SURGERY  1975   POSTERIOR CERVICAL FUSION/FORAMINOTOMY N/A 11/24/2020   Procedure: Posterior Cervical Laminectomy Cervical three-four, Cervical four-five with lateral mass fusion;  Surgeon: [name], MD;  Location: [place];  Service: Neurosurgery;  Laterality: N/A;   REFRACTIVE SURGERY Bilateral    Right total hip replacement  4/14   SKIN CANCER DESTRUCTION     nose, ear   TONSILLECTOMY  as child   TOTAL HIP ARTHROPLASTY Left    URETHRAL DILATION  1991   [name]   Patient Active Problem List   Diagnosis Date Noted   Irregular heart beat 02/03/2022   Constipation 02/03/2022   Carpal tunnel syndrome 01/24/2022   Numbness of hand 01/24/2022   Ulnar neuropathy 01/24/2022   Skin lesion 01/16/2022   Hand pain, left 01/16/2022   History of COVID-19 12/05/2021   Benign paroxysmal positional vertigo 09/14/2021   Palpitations 07/25/2021   Cardiac murmur 07/25/2021   History of chicken pox 07/22/2021   History of hemorrhoids 07/22/2021   Hypertension 07/22/2021   COPD (chronic obstructive  pulmonary disease) ([place]) 07/22/2021   History of kidney stones 07/22/2021   History of skin cancer 65/53/7482   Complication of anesthesia 07/22/2021   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[place]) 07/22/2021   Other abnormal glucose 07/22/2021   Spondylitis ([place]) 07/22/2021   Greater trochanteric pain syndrome 07/22/2021   Vertigo 07/08/2021   Hypokalemia 07/08/2021   Trochanteric bursitis of left hip 06/30/2021   Cancer ([place]) 04/11/2021   Lumbar radiculopathy 03/02/2021   Gait instability 03/02/2021   Hip pain 03/02/2021   Situational depression 03/02/2021   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[place]) 02/09/2021   Urinary retention 11/26/2020   Cervical myelopathy ([place]) 11/24/2020   Body mass index (BMI) 25.0-25.9, adult 11/02/2020   Lumbar spondylosis 11/02/2020   Lumbago of lumbar region with sciatica 10/21/2020   Cystoid macular edema of both eyes 07/28/2020   Status post cervical spinal fusion 07/09/2020   History of total bilateral knee replacement 07/08/2020   Aortic atherosclerosis ([place]) 03/05/2020   Emphysema, unspecified ([place]) 03/05/2020   Arthritis 03/04/2020   Diverticulosis 03/03/2020   Internal hemorrhoids 03/03/2020   Weight loss 03/03/2020   Heme positive stool 02/29/2020   Leg cramps 11/13/2019   Tibialis anterior tendon tear, nontraumatic 11/13/2019   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 07/29/2018   CPAP (continuous positive airway pressure) dependence 03/25/2018   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   Erectile dysfunction 06/20/2017   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 03/06/2017   Peripheral neuropathy 06/19/2016   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   Hearing loss 02/02/2016   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   Allergic rhinitis 02/02/2016   Atypical chest pain 11/24/2015   Preventative health care 11/24/2015   Onychomycosis 06/30/2015   Degenerative  disc disease, lumbar 06/30/2015   Other allergic rhinitis 02/18/2015   Deviated nasal septum 02/18/2015   RLS (restless legs syndrome) 02/18/2015   GERD (gastroesophageal reflux disease) 09/18/2014   Hyperglycemia 03/03/2014   Rotator cuff tear 07/27/2013   Sleep apnea with use of continuous positive airway pressure (CPAP) 07/15/2013   Carotid stenosis 02/24/2013   Nonspecific abnormal electrocardiogram (ECG) (EKG) 01/06/2013   DJD (degenerative joint disease) of hip 12/24/2012   Lower GI bleed 12/12/2012  Right groin pain 11/29/2012   Ventral hernia 07/08/2012   High risk medication use 01/18/2012   Dyspnea 06/12/2011   Hyperlipidemia with target LDL less than 130 12/27/2010   Osteoarthrosis, unspecified whether generalized or localized, pelvic region and thigh 07/25/2010   Osteoarthrosis, hand 06/13/2010   Anemia 12/17/2009   Hypothyroidism 05/13/2009   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   Benign essential hypertension 05/13/2009   Right inguinal hernia 05/13/2009   HIATAL HERNIA 05/13/2009   NEPHROLITHIASIS 05/13/2009   ROSACEA 05/13/2009    PCP: [name], NP  REFERRING PROVIDER: [name], NP  REFERRING DIAG: R26.81 (ICD-10-CM) - Gait instability   THERAPY DIAG:  Unsteadiness on feet  Other abnormalities of gait and mobility  Muscle weakness (generalized)  Repeated falls  Other low back pain  RATIONALE FOR EVALUATION AND TREATMENT:  Rehabilitation  ONSET DATE:  Fall 01/12/22 (2 falls in past 6 months)  SUBJECTIVE:   SUBJECTIVE STATEMENT: Pt reports he woke up with no pain today but still feels weak. Will sometimes have some soreness after doing his exercise but this seems to resolve w/o issues.   PAIN:  Are you having pain? No  PERTINENT HISTORY:  Aortic atherosclerosis; carotid stenosis; cervical myelopathy s/p ACDF 06/2020 and posterior cervical fusion 11/2020; peripheral neuropathy; lumbar DDD/spondylosis; lumbago with sciatica/lumbar radiculopathy;  OA; B TKA; BTHA; L greater trochanteric pain syndrome; L RCR 2014; recent CTR surgery - early May; hearing loss; HTN; BPPV/vertigo; B sacroiliitis; skin cancer; COPD/emphysema; sleep apnea with CPAP; GERD; gout; HLD; LE cramps; RLS  PRECAUTIONS: None  WEIGHT BEARING RESTRICTIONS: No  FALLS: Has patient fallen in last 6 months? Yes. 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 No acute intracranial abnormality.  2. No acute fracture or traumatic subluxation of the cervical spine. L wrist x-ray: 1. Mild dorsal wrist soft tissue swelling with small ossific density overlying the proximal carpal row. Correlate for triquetral fracture.  2. Moderate scaphotrapeziotrapezoid and mild first CMC joint osteoarthritis. L elbow x-ray: 1. No acute fracture or dislocation, left elbow.  2. Mild elevation of the anterior fat pad suggestive of a small joint effusion, which may be reactive in the setting of elbow osteoarthritis. L shoulder x-rays: No acute osseous abnormality.  COGNITION: Overall cognitive status: Within functional limits for tasks assessed and Impaired: Memory: Impaired: Short term Procedural   SENSATION: Peripheral neuropathy in B feet  COORDINATION: Mildly impaired with slow  movements  EDEMA:  Mild to moderate edema in distal B LE  MUSCLE LENGTH: Hamstrings: mod tight R>L ITB: mod tight B Piriformis: mod tight L>R Hip flexors: mod tight B Quads: mod tight B Heelcord: mod tight B  POSTURE:  rounded shoulders, forward head, and flexed trunk   LOWER EXTREMITY MMT:   Tested in sitting on  eval 02/22/22, 03/22/22 & 04/17/22 MMT Right 02/22/22 Left 02/22/22 Right 03/22/22 Left 03/22/22 Right 04/17/22 Left  04/17/22  Hip flexion 3+ 4- 3+ 4- 4- 4-  Hip extension 3- 3- 3+ 3+ 3+ 3+  Hip abduction 4- 4 4 4+ 4 4+  Hip adduction '4 4 4 ' 4+ 4+ 4+  Hip internal rotation 3 3+ 3 3+ 3 3+  Hip external rotation 3+ 3+ 3+ 3+ 3+ 3+  Knee flexion 4- 4- 4 4 4+ 4+  Knee extension 4- 4 4 4+ 4+ 4+  Ankle dorsiflexion 4- 4 4- 4 4 4-  Ankle plantarflexion 3+  6 SLS HR 4 10 SLS HR 4+ 20 SLS HR Limited lift on last few reps 5 20 SLS HR 5- 18 SLS HR 5 20 SLS HR  Ankle inversion 3+ 4- 3+ 4- 4- 4-  Ankle eversion 3+ 4- 3+ 4- 4- 4-  (Blank rows = not tested)  BED MOBILITY:  Sit to supine SBA Supine to sit SBA  TRANSFERS: Assistive device utilized: Single point cane and None  Sit to stand: Modified independence Stand to sit: Modified independence Chair to chair: Modified independence Floor:  NT  GAIT: Gait pattern: step through pattern, decreased stride length, decreased hip/knee flexion- Right, decreased hip/knee flexion- Left, decreased trunk rotation, and wide BOS Distance walked: 80 Assistive device utilized: Single point cane Level of assistance: SBA Comments: Gait speed: 2.10 ft/sec with SPC, 2.31 ft/sec w/o AD  RAMP: Level of Assistance:  NT Assistive device utilized:    Ramp Comments:   CURB:  Level of Assistance:  NT Assistive device utilized:    Curb Comments:   STAIRS:  Level of Assistance:  NT  Stair Negotiation Technique:   Number of Stairs:    Height of Stairs:   Comments:   FUNCTIONAL TESTS:  5 times sit to stand: 13.0 sec Timed up and go  (TUG): 13.53 sec with SPC; 14.19 sec w/o AD 10 meter walk test: 15.60 sec with SPC; 13.04 sec w/o AD Berg Balance Scale: 39/56 - scores 37-45 indicate significant risk for falls (>80%) Dynamic Gait Index: 11/24; Scores of 19 or less are predictive of falls in older community living adults  PATIENT SURVEYS:  ABC scale 55% of self confidence; 45% impairment  TODAY'S TREATMENT:   04/17/22 THERAPEUTIC EXERCISE: to improve flexibility, strength and mobility.  Verbal and tactile cues throughout for technique. NuStep L6 x 6 min  THERAPEUTIC ACTIVITIES: 5 times sit to stand: 14.63 sec Timed up and go (TUG): 14.28 sec with SPC; 12.90 sec w/o AD 10 meter walk test: 10.96 sec with SPC; 10.43 sec w/o AD Gait speed: 2.99 ft/sec with SPC; 3.14 ft/sec w/o AD Berg Balance Scale: 48/56 - scores 46-51 = moderate fall risk (>50%)  Dynamic Gait Index: 18/24; Scores of 19 or less are predictive of falls in older community living adults MMT Goal assessment   04/11/22 Therapeutic Exercise: Nustep L5x40mn Heel/toe raise x 10 at counter Standing chops with RTB x 10 each way Standing lat pull with RTB x 20 Standing shld ext with RTB x 20  Nuero Re-ed: SLS with opp leg reach to colored dots 5 ways each side 5x Tandem walk + retro gait at counter top 3x down/back - cues for heel/toe pattern with tandem walk   04/06/22 THERAPEUTIC EXERCISE: to improve flexibility, strength and mobility.  Verbal and tactile cues throughout for technique. NuStep L6 x 6 min Bridge + RTB hip ABD isometric 10 x 3", 2 sets -  intermittent cramping in L HS noted on 1st but alleviated with cues to press into flat foot rather than heels with lift TrA + RTB march 2 x 10 TrA + RTB bent knee fallouts 2 x 10 TrA + bent knee up/up/down/down to 90/90 position x 10 Hooklying dead bug x 10 L Hooklying HS stretch with strap 3 x 30 sec Standing RTB row 10 x 3", 2 sets - 2nd set in staggered stance Standing RTB scap retraction + shoulder  extension to neutral 10 x 3", 2 sets - 2nd set in staggered stance Standing B RTB pallof press + short arc rotation x 10   PATIENT EDUCATION: Education details: progress with PT and ongoing PT POC Person educated: Patient Education method: Explanation Education comprehension: verbalized understanding   HOME EXERCISE PROGRAM: Access Code: [email or web address] URL: [email or web address] Date: 03/22/2022 Prepared by: Annie Paras  Exercises - Standing Gastroc Stretch at Counter  - 1-2 x daily - 7 x weekly - 2-3 reps - 30 sec hold - Standing Soleus Stretch at Counter  - 1-2 x daily - 7 x weekly - 2-3 reps - 30 sec hold - Hip Flexor Stretch on Step  - 1-2 x daily - 7 x weekly - 2-3 reps - 30 sec hold - Side Stepping with Resistance at Ankles and Counter Support  - 1 x daily - 7 x weekly - 2 sets - 10 reps - Forward and Backward Step Over with Counter Support  - 1 x daily - 7 x weekly - 2 sets - 10 reps - Turning in Corner 360  - 1 x daily - 7 x weekly - 2 sets - 5 reps - Tandem Stance in Corner  - 1 x daily - 7 x weekly - 2 reps - 30 sec hold - Single Leg Stance with Support  - 1 x daily - 7 x weekly - 2 sets - 30 sec hold - Squat with Chair Touch  - 1 x daily - 7 x weekly - 2 sets - 10 reps - 3 sec hold - Seated Hamstring Stretch  - 1-2 x daily - 7 x weekly - 2 sets - 30 sec hold - Seated Hip Adductor Stretch  - 1-2 x daily - 7 x weekly - 3 reps - 30 sec hold - Seated Figure 4 Piriformis Stretch  - 1-2 x daily - 7 x weekly - 2 sets - 30 sec hold - Supine Piriformis Stretch with Foot on Ground  - 1-2 x daily - 7 x weekly - 2-3 reps - 30 sec hold - Supine Lower Trunk Rotation  - 1 x daily - 7 x weekly - 2 sets - 30 sec hold - Bent Knee Fallouts with Alternating Legs  - 1 x daily - 7 x weekly - 2 sets - 10 reps - 3 sec hold - Seated Hip Abduction with Resistance  - 1 x daily - 3-4 x weekly - 2 sets - 10 reps - 3-5 sec hold - Seated Single Leg Hip Abduction with Resistance  - 1 x daily -  3-4 x weekly - 2 sets - 10 reps - 3 sec hold - Seated March with Resistance  - 1 x daily - 3-4 x weekly - 2 sets - 10 reps - 3 sec hold - Seated Shoulder Row with Anchored Resistance  - 1 x daily - 3-4 x weekly - 2 sets - 10 reps - 3-5 hold hold  Patient Education - Check for Safety   ASSESSMENT:  CLINICAL IMPRESSION: [name] "[name]" reports only 1 fall since start of PT. He feels that his balance is improving but feels that the weakness in his legs continues to make him feel less steady. Strength is slowly improving but significant weakness still evident proximally and to a lesser degree distally in ankles but he continues to note weakness/fatigue in core muscles especially in standing,. Further gains noted on all standardized balance tests today with STG #2 for Merrilee Jansky now met. He continues to rely on his cane for most ambulation and tends to walk with high guard with increased trunk sway when ambulating w/o AD. Progress observed toward all goals with LTGs #4 and 8 now met, however remaining goals continue to have good potential for further gain. Will recommend recert for additional 2x/wk for 6 weeks to continue focus on strengthening and balance to improve gait stability and reduce risk for further falls.  OBJECTIVE IMPAIRMENTS Abnormal gait, decreased activity tolerance, decreased balance, decreased endurance, decreased knowledge of condition, decreased knowledge of use of DME, decreased mobility, difficulty walking, decreased ROM, decreased strength, decreased safety awareness, increased fascial restrictions, impaired perceived functional ability, increased muscle spasms, impaired flexibility, improper body mechanics, postural dysfunction, and pain.   ACTIVITY LIMITATIONS carrying, lifting, bending, standing, squatting, stairs, transfers, and locomotion level  PARTICIPATION LIMITATIONS: cleaning, laundry, interpersonal relationship, community activity, and yard work  PERSONAL FACTORS Age,  Fitness, Past/current experiences, Time since onset of injury/illness/exacerbation, and 3+ comorbidities: Aortic atherosclerosis; carotid stenosis; cervical myelopathy s/p ACDF 06/2020 and posterior cervical fusion 11/2020; peripheral neuropathy; lumbar DDD/spondylosis; lumbago with sciatica/lumbar radiculopathy; OA; B TKA; BTHA; L greater trochanteric pain syndrome; L RCR 2014; recent CTR surgery - early May; hearing loss; HTN; BPPV/vertigo; B sacroiliitis; skin cancer; COPD/emphysema; sleep apnea with CPAP; GERD; gout; HLD; LE cramps; RLS  are also affecting patient's functional outcome.   REHAB POTENTIAL: Good  CLINICAL DECISION MAKING: Evolving/moderate complexity  EVALUATION COMPLEXITY: Moderate  GOALS: Goals reviewed with patient? Yes  SHORT TERM GOALS: Target date: 03/22/2022   Patient will be independent with initial HEP. Baseline:  Goal status: MET  03/22/22  2.  Patient will demonstrate decreased fall risk by scoring > 45/56 on Berg. Baseline: 39/56 (eval); 43/56 as of 03/22/22 Goal status: MET  04/17/22 - 48/56  3.  Patient will be educated on strategies to decrease risk of falls.  Baseline:  Goal status: MET  03/22/22 - Education provided last visit - Pt reports no concerns as he went through the home checklist other than the bathroom mat non-skid backing has disintegrated from repeated washing (he states he pushes it to the side while he is in the BR)   LONG TERM GOALS: Target date: 05/29/2022   Patient will be independent with advanced/ongoing HEP to improve outcomes and carryover.  Baseline:  Goal status: IN PROGRESS  2.  Patient will be able to ambulate 600' with LRAD with good safety to access community.  Baseline:  Goal status: IN PROGRESS  3.  Patient will be able to step up/down curb safely with LRAD for safety with community ambulation.  Baseline:  Goal status: MET  04/17/22  4.  Patient will demonstrate improved functional LE strength as demonstrated by overall MMT  >/= 4 to 4+/5. Baseline:  Goal status: IN PROGRESS  04/17/22 - Progressing (refer to MMT chart)  5.  Patient will demonstrate at least 19/24 on DGI to improve gait stability and reduce risk for falls. Baseline: 11/24 (eval); 16/24 (03/31/22); 18/24 (04/17/22) Goal status: IN PROGRESS  04/17/22 - Progressing as above  6.  Patient will score >/= 52/56 on Berg Balance test to demonstrate lower risk of falls. (MCID= 8 points) .  Baseline: 39/56 (eval - 02/22/22); 43/56 (03/22/22); 48/56 (04/17/22) Goal status: IN PROGRESS  04/17/22 - Progressing as above  7.  Patient will report >67% of self confidence on ABC scale to demonstrate improved functional ability. Baseline: 55% self confidence (eval); 53.8 % (04/17/22) Goal status: IN PROGRESS  8.  Patient will demonstrate gait speed of >/= 2.62 ft/sec (0.80 m/s) to be a safe community ambulator with decreased risk for recurrent falls.  Baseline: 2.10 ft/sec with SPC, 2.31 ft/sec w/o AD (eval); 2.90 ft/sec with SPC; 2.92 ft/sec w/o AD (03/31/22); 2.99 ft/sec with SPC; 3.14 ft/sec w/o AD (04/17/22) Goal status: MET  03/31/22   PLAN: PT FREQUENCY: 2x/week  PT DURATION: 8 weeks  PLANNED INTERVENTIONS: Therapeutic exercises, Therapeutic activity, Neuromuscular re-education, Balance training, Gait training, Patient/Family education, Joint mobilization, Stair training, Vestibular training, Canalith repositioning, Dry Needling, Electrical stimulation, Spinal manipulation, Cryotherapy, Moist heat, Taping, Ultrasound, Ionotophoresis 48m/ml Dexamethasone, Manual therapy, and Re-evaluation  PLAN FOR NEXT SESSION:  progress lumbopelvic/LE flexibility and strengthening; balance training; HEP review/update/modification as needed   JPercival Spanish PT 04/17/2022, 5:36 PM

## 2022-04-18 ENCOUNTER — Ambulatory Visit: Payer: Medicare HMO

## 2022-04-18 DIAGNOSIS — M5416 Radiculopathy, lumbar region: Secondary | ICD-10-CM | POA: Diagnosis not present

## 2022-04-19 NOTE — Progress Notes (Unsigned)
PATIENT: [name] Pratt DOB: [birth year]  REASON FOR VISIT: follow up HISTORY FROM: patient  Chief Complaint  Patient presents with   Follow-up    Pt in 4  p pt here CPAP f/u   Pt states he is having leaking from mask at night. Pt states he is sore behind ears in am      HISTORY OF PRESENT ILLNESS: Today 04/20/22: [name] Pratt is an 81 year old male with OSA on CPAP. He returns today for  Reports that he wakes up feeling rested. Never did the titration study because he had other tests going on at that time. Would like to schedule now. DL is below.     5/5/2: [name] Pratt is an 81 year old male with a history of obstructive sleep apnea on CPAP.  He returns today for follow-up.  He reports that since his last visit he has had 2 neck surgeries.  He reports during that time he found it difficult to use his CPAP.  He denies any new issues with the CPAP.  He returns today for an evaluation.  HISTORY:  [name] Pratt is a 81 year old male with a history of obstructive sleep apnea on CPAP.  His download indicates that he uses machine nightly for compliance of 100%.  He uses machine greater than 4 hours each night.  On average he uses his machine 7 hours and 32 minutes.  His residual AHI is 3.8 on 8 to 14 cm of water with EPR of 2.  He does not have a significant leak.  He states that the CPAP is working better for him.  He states that the new machine is much better.  He denies any new issues.  He returns today for an evaluation.  REVIEW OF SYSTEMS: Out of a complete 14 system review of symptoms, the patient complains only of the following symptoms, and all other reviewed systems are negative.  ESS 9 FSS 34    ALLERGIES: Allergies  Allergen Reactions   Pravastatin Other (See Comments)    Muscle cramps   Sildenafil Other (See Comments)    Tachycardia     Oxycodone     hallucinations   Atenolol Other (See Comments)    bradycardia   Elastic Bandages & [Zinc] Other (See Comments)     Teflon bandages - Irritation   Metoclopramide Hcl Anxiety    HOME MEDICATIONS: Outpatient Medications Prior to Visit  Medication Sig Dispense Refill   acetaminophen (TYLENOL) 650 MG CR tablet Take 1,300 mg by mouth every morning. And take 650 mg at bedtime     albuterol (VENTOLIN HFA) 108 (90 Base) MCG/ACT inhaler INHALE 2 PUFFS INTO THE LUNGS EVERY 6 HOURS AS NEEDED FOR SHORTNESS OF BREATH. 1 each 3   Alcohol Swabs (ALCOHOL PADS) 70 % PADS 1 packet by Does not apply route 2 (two) times daily. 100 each 1   allopurinol (ZYLOPRIM) 100 MG tablet TAKE 1 TABLET TWICE DAILY 180 tablet 0   ampicillin (PRINCIPEN) 500 MG capsule Take 1 capsule (500 mg total) by mouth 4 (four) times daily. 24 capsule 0   aspirin EC 81 MG tablet Take 81 mg by mouth every morning.     Cholecalciferol (VITAMIN D) 50 MCG (2000 UT) CAPS Take 2,000 Units by mouth daily.     colchicine 0.6 MG tablet Take 0.6 mg by mouth as needed for other. For gout     cycloSPORINE (RESTASIS) 0.05 % ophthalmic emulsion Place 1 drop into both eyes 2 (two) times  daily.     diclofenac Sodium (VOLTAREN) 1 % GEL Apply 1 application topically daily as needed for pain (Hand pain).     fluocinonide (LIDEX) 0.05 % external solution Apply 1 application topically 2 (two) times daily as needed for itching or pain.     gabapentin (NEURONTIN) 300 MG capsule TAKE 1 CAPSULE TWO TO THREE TIMES DAILY AS NEEDED 180 capsule 1   GEMTESA 75 MG TABS Take 1 tablet by mouth daily.     glucose blood test strip TRUE Metrix Blood Glucose Test Strip, use as instructed 100 each 12   Homeopathic Products (THERAWORX RELIEF) FOAM Apply 1 application topically 2 (two) times daily as needed (Pain).     hydrALAZINE (APRESOLINE) 50 MG tablet Take 1 tablet (50 mg total) by mouth 2 (two) times daily. 60 tablet 2   Lancets MISC 1 each by Does not apply route 2 (two) times daily. TRUE matrix lancets 100 each 3   levothyroxine (SYNTHROID) 150 MCG tablet Take 150 mcg 6 days a  week (patient takes 75 mcg once a week) Disp #84 for 90 day supply 84 tablet 3   levothyroxine (SYNTHROID) 75 MCG tablet Take 75 mcg once a week (patient on 150 mcg 6 days a week) Dispense #24 for 90 day supply. 24 tablet 3   losartan (COZAAR) 100 MG tablet TAKE 1 TABLET EVERY DAY 90 tablet 1   Magnesium 500 MG TABS Take 500 mg by mouth daily.     meclizine (ANTIVERT) 25 MG tablet Take 1 tablet (25 mg total) by mouth 3 (three) times daily as needed for dizziness. 20 tablet 0   multivitamin (THERAGRAN) per tablet Take 1 tablet by mouth daily.     nitroGLYCERIN (NITROSTAT) 0.4 MG SL tablet Place 1 tablet (0.4 mg total) under the tongue every 5 (five) minutes as needed for chest pain. 25 tablet 6   ondansetron (ZOFRAN-ODT) 4 MG disintegrating tablet Take 1 tablet (4 mg total) by mouth every 8 (eight) hours as needed for nausea or vomiting. 10 tablet 0   polyethylene glycol (MIRALAX / GLYCOLAX) 17 g packet Take 17-34 g by mouth daily as needed for constipation.     rosuvastatin (CRESTOR) 5 MG tablet TAKE 1 TABLET EVERY DAY 90 tablet 1   sodium chloride (OCEAN) 0.65 % nasal spray Place 1 spray into both nostrils at bedtime as needed for congestion.     traMADol (ULTRAM) 50 MG tablet Take 1 tablet (50 mg total) by mouth every 12 (twelve) hours as needed. 10 tablet 0   amLODipine (NORVASC) 5 MG tablet Take 1 tablet (5 mg total) by mouth daily. (Patient not taking: Reported on 04/20/2022) 90 tablet 3   No facility-administered medications prior to visit.    PAST MEDICAL HISTORY: Past Medical History:  Diagnosis Date   Allergic rhinitis 02/02/2016   Anemia 12/17/2009   Formatting of this note might be different from the original. Anemia  10/1 IMO update   Aortic atherosclerosis ([place]) 03/05/2020   Arthritis    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   Atypical chest pain 11/24/2015   Benign essential hypertension 05/13/2009   Qualifier: Diagnosis of  By: Larwance Sachs of this note might be different from the original. Hypertension   Benign paroxysmal positional vertigo 09/14/2021   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[place]) 07/22/2021   Body mass index (BMI) 25.0-25.9, adult 11/02/2020   Cancer ([place])    skin cancer   Cardiac murmur 07/25/2021   Carotid stenosis  a. Carotid U/S 4/23: LICA < 53%, RICA 61-44%;  b.  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   Cystoid macular edema of both eyes 07/28/2020   Degenerative disc disease, lumbar 06/30/2015   Deviated nasal septum 02/18/2015   Diverticulosis 03/03/2020   DJD (degenerative joint disease) of hip 12/24/2012   Dyspnea 06/12/2011   Emphysema, unspecified ([place]) 03/05/2020   Erectile dysfunction 06/20/2017   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[place]) 02/09/2021   Gait disorder 03/02/2021   GERD (gastroesophageal reflux disease)    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   Qualifier: Diagnosis of  By: Burnett Kanaris     Greater trochanteric pain syndrome 07/22/2021   Hand pain, left 01/16/2022   Hearing loss 02/02/2016   Heme positive stool 02/29/2020   HIATAL HERNIA 05/13/2009   Qualifier: Diagnosis of  By: Burnett Kanaris     High risk medication use 01/18/2012   Hip pain 03/02/2021   History of chicken pox    History of COVID-19 12/05/2021   History of hemorrhoids    History of kidney stones    History of skin cancer 07/22/2021   skin cancer   History of total bilateral knee replacement 07/08/2020   Hyperglycemia 03/03/2014   Hyperlipidemia with target LDL less than 130 12/27/2010   Formatting of this note  might be different from the original. 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 Osteoarthritis Of The Hand  10/1 IMO update   Osteoarthrosis, unspecified whether generalized or localized, pelvic region and thigh 07/25/2010   Formatting of this note might be different from the original. Osteoarthritis Of The Hip   Other abnormal glucose 07/22/2021   Other allergic rhinitis 02/18/2015   Palpitations 07/25/2021   Peripheral neuropathy 06/19/2016   Preventative health care 11/24/2015   Right groin pain 11/29/2012   Right inguinal hernia 05/13/2009   Qualifier: Diagnosis of  By: Burnett Kanaris     RLS (restless legs syndrome) 02/18/2015   Rosacea    Rotator cuff tear 07/27/2013   Situational depression 03/02/2021   Skin lesion 01/16/2022   Sleep apnea with use of continuous positive airway pressure (CPAP) 07/15/2013   CPAP set to  7 cm water,  Residual AHi was 7.8  With no leak.  User time 6 hours.  479 days , not used only 12 days - highly compliant 06-23-13  .    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   Urinary retention 11/26/2020   Ventral hernia 07/08/2012   Vertigo 07/08/2021   Weight loss 03/03/2020    PAST SURGICAL HISTORY: Past Surgical History:  Procedure Laterality Date   ABDOMINAL HERNIA REPAIR  07/15/12   Dr [name]   ANTERIOR CERVICAL DECOMP/DISCECTOMY FUSION N/A 07/09/2020   Procedure: ANTERIOR CERVICAL DECOMPRESSION AND FUSION CERVICAL THREE-FOUR.;  Surgeon: [name], MD;  Location: [place];  Service: Neurosurgery;  Laterality: N/A;  anterior   BROW LIFT  05/07/01   CYSTOSCOPY WITH URETEROSCOPY AND STENT PLACEMENT Right 01/28/2014   Procedure: CYSTOSCOPY WITH URETEROSCOPY, BASKET RETRIVAL AND  STENT PLACEMENT;  Surgeon: [name], MD;  Location: [facility];  Service: Urology;  Laterality: Right;   epidural injections     multiple procedures   ESOPHAGEAL DILATION  1993 and 1994   multiple times   EYE SURGERY Bilateral 03/25/10, 2012   cataract removal   HEMORRHOID SURGERY  2014   HIATAL HERNIA REPAIR  12/21/93   HOLMIUM LASER APPLICATION Right 01/07/7680   Procedure: HOLMIUM LASER APPLICATION;  Surgeon: [name], MD;  Location: [facility];  Service: Urology;  Laterality: Right;   INGUINAL HERNIA REPAIR Right 07/15/12   Dr [name], x2   JOINT REPLACEMENT  07/25/04   right knee   JOINT REPLACEMENT  07/13/10   left knee   [place]  09/20/06   with intraoperative cholangiogram and right inguinal herniorrhaphy with mesh    LIGAMENT REPAIR Left 07/2013   shoulder   LITHOTRIPSY     x2   [place] FUSION/FORAMINOTOMY N/A 11/24/2020   Procedure: Posterior Cervical Laminectomy Cervical three-four, Cervical four-five with lateral mass fusion;  Surgeon: [name], MD;  Location: [place];  Service: Neurosurgery;  Laterality: N/A;   REFRACTIVE SURGERY Bilateral    Right total hip replacement  4/14   SKIN CANCER DESTRUCTION     nose, ear   TONSILLECTOMY  as child    TOTAL HIP ARTHROPLASTY Left    URETHRAL DILATION  1991   [name]    FAMILY HISTORY: Family History  Problem Relation Age of Onset   Cancer Mother    Hypertension Other    Cancer Other    Osteoarthritis Other     SOCIAL HISTORY: Social History   Socioeconomic History   Marital status: Married    Spouse name: Not on file   Number of children: 1   Years of education: Not on file   Highest education level: Not on file  Occupational History   Occupation: firefighter    Comment: paints on the side  Tobacco Use   Smoking status: Former    Years: 11.00    Types: Cigarettes    Quit date: 09/11/1978    Years since quitting: 43.6   Smokeless tobacco: Never  Vaping Use   Vaping Use: Never used  Substance and Sexual Activity   Alcohol use: Not Currently    Alcohol/week: 0.0 standard drinks of alcohol    Comment: rare   Drug use: No   Sexual activity: Not Currently  Other Topics Concern   Not on file  Social History Narrative   Lives with his wife   He has one son- lives  locally.   2 grandchildren   Has worked for Research officer, trade union   Enjoys wood working   Completed HS.  [employer]   Social Determinants of Health   Financial Resource Strain: Low Risk  (10/28/2021)   Overall Financial Resource Strain (CARDIA)    Difficulty of Paying Living Expenses: Not very hard  Food Insecurity: No Food Insecurity (10/28/2021)   Hunger Vital Sign    Worried About Running Out of Food in the Last Year: Never true    Ran Out of Food in the Last Year: Never true  Transportation Needs: No Transportation Needs (08/01/2021)   PRAPARE - Hydrologist (Medical): No    Lack of Transportation (Non-Medical): No  Physical Activity: Insufficiently Active (10/28/2021)   Exercise Vital Sign    Days of Exercise per Week: 2 days    Minutes of Exercise per Session: 30 min  Stress: No Stress Concern Present (01/19/2022)   [place]    Feeling of Stress : Only a little  Social Connections: Moderately Isolated (01/19/2022)   Social Connection and Isolation Panel [NHANES]    Frequency of Communication with Friends and Family: More than three times a week    Frequency of Social Gatherings with Friends and Family: More than three times a week    Attends Religious Services: Never    Marine scientist or Organizations: No    Attends Archivist Meetings: Never    Marital Status: Married  Human resources officer Violence: Not At Risk (01/19/2022)   Humiliation, Afraid, Rape, and Kick questionnaire    Fear of Current or Ex-Partner: No    Emotionally Abused: No    Physically Abused: No    Sexually Abused: No      PHYSICAL EXAM  Vitals:   04/20/22 0931  BP: (!) 152/67  Pulse: 63  Weight: 153 lb (69.4 kg)  Height: '5\' 6"'$  (1.676 m)   Body mass index is 24.69 kg/m.  Generalized: Well developed, in no acute distress  Chest: Lungs clear to auscultation bilaterally  Neurological examination  Mentation: Alert oriented to time, place, history taking. Follows all commands speech and language fluent Cranial nerve II-XII: Extraocular movements were full, visual field were full on confrontational test Head turning and shoulder shrug  were normal and symmetric.  Mallampati 1+ Gait and station: Gait is normal.    DIAGNOSTIC DATA (LABS, IMAGING, TESTING) - I reviewed patient records, labs, notes, testing and imaging myself where available.  Lab Results  Component Value Date   WBC 6.2 11/17/2021   HGB 12.1 (L) 11/17/2021   HCT 36.8 (L) 11/17/2021   MCV 97.9 11/17/2021   PLT 318 11/17/2021      Component Value Date/Time   NA 140 03/15/2022 0000   K 3.8 03/15/2022 0000   CL 103 03/15/2022 0000   CO2 25 11/17/2021 0520   GLUCOSE 115 (H) 11/17/2021 0520   BUN 34 (A) 03/15/2022 0000   CREATININE 1.24 11/17/2021 0520   CREATININE 1.74 (H) 06/29/2020 1023   CALCIUM 9.6  03/15/2022 0000   PROT 7.9 11/17/2021 0520   ALBUMIN 4.4 03/15/2022 0000   AST 29 11/17/2021 0520   ALT 22 11/17/2021 0520   ALKPHOS 76 11/17/2021 0520   BILITOT 0.6 11/17/2021 0520   GFRNONAA 58 (L) 11/17/2021 0520   GFRNONAA 73 11/25/2012 1103   GFRAA 51 (L) 03/05/2020 0334   GFRAA 84 11/25/2012 1103   Lab  Results  Component Value Date   CHOL 117 09/13/2021   HDL 50.40 09/13/2021   LDLCALC 50 09/13/2021   TRIG 80.0 09/13/2021   CHOLHDL 2 09/13/2021   Lab Results  Component Value Date   HGBA1C 5.5 03/07/2022   Lab Results  Component Value Date   VITAMINB12 605 03/04/2020   Lab Results  Component Value Date   TSH 0.76 03/07/2022      ASSESSMENT AND PLAN 81 y.o. year old male  has a past medical history of Allergic rhinitis (02/02/2016), Anemia (12/17/2009), Aortic atherosclerosis ([place]) (03/05/2020), Arthritis, Asymmetric SNHL (sensorineural hearing loss) (02/29/2016), Atypical chest pain (11/24/2015), Benign essential hypertension (05/13/2009), Benign paroxysmal positional vertigo (09/14/2021), 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[place]) (07/22/2021), Body mass index (BMI) 25.0-25.9, adult (11/02/2020), Cancer ([place]), Cardiac murmur (07/25/2021), Carotid stenosis, Carpal tunnel syndrome (01/24/2022), Cervical myelopathy ([place]) (11/24/2020), Complex sleep apnea syndrome (29/56/2130), Complication of anesthesia, COPD (chronic obstructive pulmonary disease) ([place]), CPAP (continuous positive airway pressure) dependence (03/25/2018), Cranial nerve IV palsy (02/02/2016), Cystoid macular edema of both eyes (07/28/2020), Degenerative disc disease, lumbar (06/30/2015), Deviated nasal septum (02/18/2015), Diverticulosis (03/03/2020), DJD (degenerative joint disease) of hip (12/24/2012), Dyspnea (06/12/2011), Emphysema, unspecified ([place]) (03/05/2020), Erectile dysfunction (06/20/2017), 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[place]) (02/09/2021), Gait  disorder (03/02/2021), GERD (gastroesophageal reflux disease), 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), Greater trochanteric pain syndrome (07/22/2021), Hand pain, left (01/16/2022), Hearing loss (02/02/2016), Heme positive stool (02/29/2020), HIATAL HERNIA (05/13/2009), High risk medication use (01/18/2012), Hip pain (03/02/2021), History of chicken pox, History of COVID-19 (12/05/2021), History of hemorrhoids, History of kidney stones, History of skin cancer (07/22/2021), History of total bilateral knee replacement (07/08/2020), Hyperglycemia (03/03/2014), Hyperlipidemia with target LDL less than 130 (12/27/2010), Hypertension, Hypokalemia (07/08/2021), Hypothyroidism, Internal hemorrhoids (03/03/2020), Leg cramps (11/13/2019), Lower GI bleed (12/12/2012), Lumbago of lumbar region with sciatica (10/21/2020), Lumbar radiculopathy (03/02/2021), Lumbar spondylosis (11/02/2020), NEPHROLITHIASIS (05/13/2009), Nonspecific abnormal electrocardiogram (ECG) (EKG) (01/06/2013), Numbness of hand (01/24/2022), Onychomycosis (06/30/2015), Osteoarthrosis, hand (06/13/2010), Osteoarthrosis, unspecified whether generalized or localized, pelvic region and thigh (07/25/2010), Other abnormal glucose (07/22/2021), Other allergic rhinitis (02/18/2015), Palpitations (07/25/2021), Peripheral neuropathy (06/19/2016), Preventative health care (11/24/2015), Right groin pain (11/29/2012), Right inguinal hernia (05/13/2009), RLS (restless legs syndrome) (02/18/2015), Rosacea, Rotator cuff tear (07/27/2013), Situational depression (03/02/2021), Skin lesion (01/16/2022), Sleep apnea with use of continuous positive airway pressure (CPAP) (07/15/2013), Spondylitis ([place]) (07/22/2021), Status post cervical spinal fusion (07/09/2020), 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 here with:  OSA on CPAP  - CPAP compliance excellent - Residual AHI remains slightly elevated despite pressure changes.  Patient is amenable to now scheduling the CPAP titration - Encourage patient to use CPAP nightly and > 4 hours each night - F/U in 1 year or sooner if needed     Ward Givens, MSN, NP-C 04/20/2022, 9:55 AM [facility] [address] [place],  [address] [phone]

## 2022-04-20 ENCOUNTER — Ambulatory Visit: Payer: Medicare HMO | Admitting: Adult Health

## 2022-04-20 ENCOUNTER — Encounter: Payer: Self-pay | Admitting: Adult Health

## 2022-04-20 ENCOUNTER — Other Ambulatory Visit: Payer: Self-pay

## 2022-04-20 ENCOUNTER — Encounter (HOSPITAL_BASED_OUTPATIENT_CLINIC_OR_DEPARTMENT_OTHER): Payer: Self-pay | Admitting: Emergency Medicine

## 2022-04-20 ENCOUNTER — Ambulatory Visit: Payer: Medicare HMO

## 2022-04-20 VITALS — BP 152/67 | HR 63 | Ht 66.0 in | Wt 153.0 lb

## 2022-04-20 DIAGNOSIS — Z8719 Personal history of other diseases of the digestive system: Secondary | ICD-10-CM | POA: Diagnosis not present

## 2022-04-20 DIAGNOSIS — M109 Gout, unspecified: Secondary | ICD-10-CM | POA: Diagnosis present

## 2022-04-20 DIAGNOSIS — K449 Diaphragmatic hernia without obstruction or gangrene: Secondary | ICD-10-CM | POA: Diagnosis not present

## 2022-04-20 DIAGNOSIS — G4733 Obstructive sleep apnea (adult) (pediatric): Secondary | ICD-10-CM

## 2022-04-20 DIAGNOSIS — Z8616 Personal history of COVID-19: Secondary | ICD-10-CM | POA: Diagnosis not present

## 2022-04-20 DIAGNOSIS — M6281 Muscle weakness (generalized): Secondary | ICD-10-CM

## 2022-04-20 DIAGNOSIS — Z79890 Hormone replacement therapy (postmenopausal): Secondary | ICD-10-CM

## 2022-04-20 DIAGNOSIS — J439 Emphysema, unspecified: Secondary | ICD-10-CM | POA: Diagnosis present

## 2022-04-20 DIAGNOSIS — R578 Other shock: Secondary | ICD-10-CM | POA: Diagnosis not present

## 2022-04-20 DIAGNOSIS — K5909 Other constipation: Secondary | ICD-10-CM | POA: Diagnosis present

## 2022-04-20 DIAGNOSIS — E039 Hypothyroidism, unspecified: Secondary | ICD-10-CM | POA: Diagnosis not present

## 2022-04-20 DIAGNOSIS — D120 Benign neoplasm of cecum: Secondary | ICD-10-CM | POA: Diagnosis present

## 2022-04-20 DIAGNOSIS — G2581 Restless legs syndrome: Secondary | ICD-10-CM | POA: Diagnosis present

## 2022-04-20 DIAGNOSIS — R2681 Unsteadiness on feet: Secondary | ICD-10-CM

## 2022-04-20 DIAGNOSIS — G9341 Metabolic encephalopathy: Secondary | ICD-10-CM | POA: Diagnosis not present

## 2022-04-20 DIAGNOSIS — Z96653 Presence of artificial knee joint, bilateral: Secondary | ICD-10-CM | POA: Diagnosis present

## 2022-04-20 DIAGNOSIS — K219 Gastro-esophageal reflux disease without esophagitis: Secondary | ICD-10-CM | POA: Diagnosis present

## 2022-04-20 DIAGNOSIS — D122 Benign neoplasm of ascending colon: Secondary | ICD-10-CM | POA: Diagnosis present

## 2022-04-20 DIAGNOSIS — Z888 Allergy status to other drugs, medicaments and biological substances status: Secondary | ICD-10-CM

## 2022-04-20 DIAGNOSIS — Z96643 Presence of artificial hip joint, bilateral: Secondary | ICD-10-CM | POA: Diagnosis present

## 2022-04-20 DIAGNOSIS — I774 Celiac artery compression syndrome: Secondary | ICD-10-CM | POA: Diagnosis not present

## 2022-04-20 DIAGNOSIS — Z809 Family history of malignant neoplasm, unspecified: Secondary | ICD-10-CM

## 2022-04-20 DIAGNOSIS — D638 Anemia in other chronic diseases classified elsewhere: Secondary | ICD-10-CM | POA: Diagnosis not present

## 2022-04-20 DIAGNOSIS — E876 Hypokalemia: Secondary | ICD-10-CM | POA: Diagnosis not present

## 2022-04-20 DIAGNOSIS — I471 Supraventricular tachycardia: Secondary | ICD-10-CM | POA: Diagnosis not present

## 2022-04-20 DIAGNOSIS — E7800 Pure hypercholesterolemia, unspecified: Secondary | ICD-10-CM | POA: Diagnosis present

## 2022-04-20 DIAGNOSIS — K922 Gastrointestinal hemorrhage, unspecified: Secondary | ICD-10-CM | POA: Diagnosis not present

## 2022-04-20 DIAGNOSIS — Z885 Allergy status to narcotic agent status: Secondary | ICD-10-CM

## 2022-04-20 DIAGNOSIS — Z981 Arthrodesis status: Secondary | ICD-10-CM

## 2022-04-20 DIAGNOSIS — D123 Benign neoplasm of transverse colon: Secondary | ICD-10-CM | POA: Diagnosis present

## 2022-04-20 DIAGNOSIS — N179 Acute kidney failure, unspecified: Secondary | ICD-10-CM | POA: Diagnosis not present

## 2022-04-20 DIAGNOSIS — I700 Atherosclerosis of aorta: Secondary | ICD-10-CM | POA: Diagnosis present

## 2022-04-20 DIAGNOSIS — K625 Hemorrhage of anus and rectum: Secondary | ICD-10-CM | POA: Diagnosis not present

## 2022-04-20 DIAGNOSIS — I10 Essential (primary) hypertension: Secondary | ICD-10-CM | POA: Diagnosis not present

## 2022-04-20 DIAGNOSIS — D62 Acute posthemorrhagic anemia: Secondary | ICD-10-CM | POA: Diagnosis not present

## 2022-04-20 DIAGNOSIS — N200 Calculus of kidney: Secondary | ICD-10-CM | POA: Diagnosis not present

## 2022-04-20 DIAGNOSIS — M5459 Other low back pain: Secondary | ICD-10-CM

## 2022-04-20 DIAGNOSIS — R296 Repeated falls: Secondary | ICD-10-CM

## 2022-04-20 DIAGNOSIS — Z8249 Family history of ischemic heart disease and other diseases of the circulatory system: Secondary | ICD-10-CM

## 2022-04-20 DIAGNOSIS — K648 Other hemorrhoids: Secondary | ICD-10-CM | POA: Diagnosis present

## 2022-04-20 DIAGNOSIS — K5731 Diverticulosis of large intestine without perforation or abscess with bleeding: Principal | ICD-10-CM | POA: Diagnosis present

## 2022-04-20 DIAGNOSIS — Z85828 Personal history of other malignant neoplasm of skin: Secondary | ICD-10-CM

## 2022-04-20 DIAGNOSIS — R55 Syncope and collapse: Secondary | ICD-10-CM | POA: Diagnosis present

## 2022-04-20 DIAGNOSIS — J690 Pneumonitis due to inhalation of food and vomit: Secondary | ICD-10-CM | POA: Diagnosis not present

## 2022-04-20 DIAGNOSIS — K921 Melena: Secondary | ICD-10-CM | POA: Diagnosis not present

## 2022-04-20 DIAGNOSIS — R2689 Other abnormalities of gait and mobility: Secondary | ICD-10-CM

## 2022-04-20 DIAGNOSIS — D126 Benign neoplasm of colon, unspecified: Secondary | ICD-10-CM | POA: Diagnosis not present

## 2022-04-20 DIAGNOSIS — Z87891 Personal history of nicotine dependence: Secondary | ICD-10-CM

## 2022-04-20 DIAGNOSIS — J449 Chronic obstructive pulmonary disease, unspecified: Secondary | ICD-10-CM | POA: Diagnosis not present

## 2022-04-20 DIAGNOSIS — D649 Anemia, unspecified: Secondary | ICD-10-CM | POA: Diagnosis not present

## 2022-04-20 DIAGNOSIS — Z9989 Dependence on other enabling machines and devices: Secondary | ICD-10-CM | POA: Diagnosis not present

## 2022-04-20 DIAGNOSIS — G629 Polyneuropathy, unspecified: Secondary | ICD-10-CM | POA: Diagnosis present

## 2022-04-20 DIAGNOSIS — K635 Polyp of colon: Secondary | ICD-10-CM | POA: Diagnosis not present

## 2022-04-20 DIAGNOSIS — K5791 Diverticulosis of intestine, part unspecified, without perforation or abscess with bleeding: Secondary | ICD-10-CM | POA: Diagnosis not present

## 2022-04-20 DIAGNOSIS — Z79899 Other long term (current) drug therapy: Secondary | ICD-10-CM

## 2022-04-20 DIAGNOSIS — Z7982 Long term (current) use of aspirin: Secondary | ICD-10-CM

## 2022-04-20 DIAGNOSIS — I160 Hypertensive urgency: Secondary | ICD-10-CM | POA: Diagnosis not present

## 2022-04-20 DIAGNOSIS — K5730 Diverticulosis of large intestine without perforation or abscess without bleeding: Secondary | ICD-10-CM | POA: Diagnosis not present

## 2022-04-20 DIAGNOSIS — N281 Cyst of kidney, acquired: Secondary | ICD-10-CM | POA: Diagnosis not present

## 2022-04-20 NOTE — Therapy (Signed)
OUTPATIENT PHYSICAL THERAPY TREATMENT    Patient Name: [name] Pratt MRN: [id number] DOB:Aug 24, 1941, 81 y.o., male Today's Date: 04/20/2022        PT End of Session - 04/20/22 1444     Visit Number 12    Number of Visits 23    Date for PT Re-Evaluation 05/29/22    Authorization Type [name]    Progress Note Due on Visit 21   Recert completed on visit #11 - 04/17/22   PT Start Time 1402    PT Stop Time 1443    PT Time Calculation (min) 41 min    Activity Tolerance Patient tolerated treatment well    Behavior During Therapy [facility] for tasks assessed/performed                        Past Medical History:  Diagnosis Date   Allergic rhinitis 02/02/2016   Anemia 12/17/2009   Formatting of this note might be different from the original. Anemia  10/1 IMO update   Aortic atherosclerosis ([place]) 03/05/2020   Arthritis    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   Atypical chest pain 11/24/2015   Benign essential hypertension 05/13/2009   Qualifier: Diagnosis of  By: Larwance Sachs of this note might be different from the original. Hypertension   Benign paroxysmal positional vertigo 09/14/2021   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[place]) 07/22/2021   Body mass index (BMI) 25.0-25.9, adult 11/02/2020   Cancer ([place])    skin cancer   Cardiac murmur 07/25/2021   Carotid stenosis    a. Carotid U/S 6/60: LICA < 63%, RICA 01-60%;  b.  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   Cystoid macular edema of both eyes 07/28/2020   Degenerative disc disease, lumbar 06/30/2015   Deviated nasal septum 02/18/2015    Diverticulosis 03/03/2020   DJD (degenerative joint disease) of hip 12/24/2012   Dyspnea 06/12/2011   Emphysema, unspecified ([place]) 03/05/2020   Erectile dysfunction 06/20/2017   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[place]) 02/09/2021   Gait disorder 03/02/2021   GERD (gastroesophageal reflux disease)    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   Qualifier: Diagnosis of  By: Burnett Kanaris     Greater trochanteric pain syndrome 07/22/2021   Hand pain, left 01/16/2022   Hearing loss 02/02/2016   Heme positive stool 02/29/2020   HIATAL HERNIA 05/13/2009   Qualifier: Diagnosis of  By: Burnett Kanaris     High risk medication use 01/18/2012   Hip pain 03/02/2021   History of chicken pox    History of COVID-19 12/05/2021   History of hemorrhoids    History of kidney stones    History of skin cancer 07/22/2021   skin cancer   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 Cardiology - Dr Frances Nickels at [facility] cardiology  ICD-10 cut over   Hypertension    under control   Hypokalemia 07/08/2021   Hypothyroidism  Internal hemorrhoids 03/03/2020   Leg cramps 11/13/2019   Lower GI bleed 12/12/2012   Lumbago of lumbar region with sciatica 10/21/2020   Lumbar radiculopathy 03/02/2021   Lumbar spondylosis 11/02/2020   NEPHROLITHIASIS 05/13/2009   Qualifier: Diagnosis of  By: Burnett Kanaris     Nonspecific abnormal electrocardiogram (ECG) (EKG) 01/06/2013   Numbness of hand 01/24/2022   Onychomycosis 06/30/2015   Osteoarthrosis, 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 Osteoarthritis Of The Hand  10/1 IMO update   Osteoarthrosis, unspecified whether generalized or localized, pelvic region and thigh 07/25/2010   Formatting of this note might be different  from the original. Osteoarthritis Of The Hip   Other abnormal glucose 07/22/2021   Other allergic rhinitis 02/18/2015   Palpitations 07/25/2021   Peripheral neuropathy 06/19/2016   Preventative health care 11/24/2015   Right groin pain 11/29/2012   Right inguinal hernia 05/13/2009   Qualifier: Diagnosis of  By: Burnett Kanaris     RLS (restless legs syndrome) 02/18/2015   Rosacea    Rotator cuff tear 07/27/2013   Situational depression 03/02/2021   Skin lesion 01/16/2022   Sleep apnea with use of continuous positive airway pressure (CPAP) 07/15/2013   CPAP set to  7 cm water,  Residual AHi was 7.8  With no leak.  User time 6 hours.  479 days , not used only 12 days - highly compliant 06-23-13  .    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   Urinary retention 11/26/2020   Ventral hernia 07/08/2012   Vertigo 07/08/2021   Weight loss 03/03/2020   Past Surgical History:  Procedure Laterality Date   ABDOMINAL HERNIA REPAIR  07/15/12   Dr [name]   ANTERIOR CERVICAL DECOMP/DISCECTOMY FUSION N/A 07/09/2020   Procedure: ANTERIOR CERVICAL DECOMPRESSION AND FUSION CERVICAL THREE-FOUR.;  Surgeon: [name], MD;  Location: [place];  Service: Neurosurgery;  Laterality: N/A;  anterior   BROW LIFT  05/07/01   CYSTOSCOPY WITH URETEROSCOPY AND STENT PLACEMENT Right 01/28/2014   Procedure: CYSTOSCOPY WITH URETEROSCOPY, BASKET RETRIVAL AND  STENT PLACEMENT;  Surgeon: [name], MD;  Location: [facility];  Service: Urology;  Laterality: Right;   epidural injections     multiple procedures   ESOPHAGEAL DILATION  1993 and 1994   multiple times   EYE SURGERY Bilateral 03/25/10,  2012   cataract removal   HEMORRHOID SURGERY  2014   HIATAL HERNIA REPAIR  12/21/93   HOLMIUM LASER APPLICATION Right 1/82/9937   Procedure: HOLMIUM LASER APPLICATION;  Surgeon: [name], MD;  Location: [facility];  Service: Urology;  Laterality: Right;   INGUINAL HERNIA REPAIR Right 07/15/12   Dr [name], x2   JOINT REPLACEMENT  07/25/04   right knee   JOINT REPLACEMENT  07/13/10   left knee   [place]  09/20/06   with intraoperative cholangiogram and right inguinal herniorrhaphy with mesh    LIGAMENT REPAIR Left 07/2013   shoulder   LITHOTRIPSY     x2   [place] FUSION/FORAMINOTOMY N/A 11/24/2020   Procedure: Posterior Cervical Laminectomy Cervical  three-four, Cervical four-five with lateral mass fusion;  Surgeon: [name], MD;  Location: [place];  Service: Neurosurgery;  Laterality: N/A;   REFRACTIVE SURGERY Bilateral    Right total hip replacement  4/14   SKIN CANCER DESTRUCTION     nose, ear   TONSILLECTOMY  as child   TOTAL HIP ARTHROPLASTY Left    URETHRAL DILATION  1991   [name]   Patient Active Problem List   Diagnosis Date Noted   Irregular heart beat 02/03/2022   Constipation 02/03/2022   Carpal tunnel syndrome 01/24/2022   Numbness of hand 01/24/2022   Ulnar neuropathy 01/24/2022   Skin lesion 01/16/2022   Hand pain, left 01/16/2022   History of COVID-19 12/05/2021   Benign paroxysmal positional vertigo 09/14/2021   Palpitations 07/25/2021   Cardiac murmur 07/25/2021   History of chicken pox 07/22/2021   History of hemorrhoids 07/22/2021   Hypertension 07/22/2021   COPD (chronic obstructive pulmonary disease) ([place]) 07/22/2021   History of kidney stones 07/22/2021   History of skin cancer 00/17/4944   Complication of anesthesia 07/22/2021   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[place]) 07/22/2021   Other abnormal glucose 07/22/2021   Spondylitis ([place]) 07/22/2021   Greater trochanteric pain  syndrome 07/22/2021   Vertigo 07/08/2021   Hypokalemia 07/08/2021   Trochanteric bursitis of left hip 06/30/2021   Cancer ([place]) 04/11/2021   Lumbar radiculopathy 03/02/2021   Gait instability 03/02/2021   Hip pain 03/02/2021   Situational depression 03/02/2021   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[place]) 02/09/2021   Urinary retention 11/26/2020   Cervical myelopathy ([place]) 11/24/2020   Body mass index (BMI) 25.0-25.9, adult 11/02/2020   Lumbar spondylosis 11/02/2020   Lumbago of lumbar region with sciatica 10/21/2020   Cystoid macular edema of both eyes 07/28/2020   Status post cervical spinal fusion 07/09/2020   History of total bilateral knee replacement 07/08/2020   Aortic atherosclerosis ([place]) 03/05/2020   Emphysema, unspecified ([place]) 03/05/2020   Arthritis 03/04/2020   Diverticulosis 03/03/2020   Internal hemorrhoids 03/03/2020   Weight loss 03/03/2020   Heme positive stool 02/29/2020   Leg cramps 11/13/2019   Tibialis anterior tendon tear, nontraumatic 11/13/2019   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 07/29/2018   CPAP (continuous positive airway pressure) dependence 03/25/2018   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   Erectile dysfunction 06/20/2017   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 03/06/2017   Peripheral neuropathy 06/19/2016   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   Hearing loss 02/02/2016   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   Allergic rhinitis 02/02/2016   Atypical chest pain 11/24/2015   Preventative health care 11/24/2015   Onychomycosis 06/30/2015   Degenerative disc disease, lumbar 06/30/2015   Other allergic rhinitis 02/18/2015   Deviated nasal septum 02/18/2015   RLS (restless legs syndrome) 02/18/2015   GERD (gastroesophageal reflux disease) 09/18/2014   Hyperglycemia 03/03/2014   Rotator cuff tear 07/27/2013   Sleep apnea with use of  continuous positive airway pressure (CPAP) 07/15/2013   Carotid stenosis 02/24/2013   Nonspecific abnormal electrocardiogram (ECG) (EKG) 01/06/2013   DJD (degenerative joint disease) of hip 12/24/2012   Lower GI bleed 12/12/2012   Right groin pain 11/29/2012   Ventral hernia 07/08/2012   High risk medication use 01/18/2012   Dyspnea 06/12/2011   Hyperlipidemia with target LDL less than 130 12/27/2010   Osteoarthrosis, unspecified whether generalized or localized, pelvic region and thigh 07/25/2010   Osteoarthrosis, hand 06/13/2010  Anemia 12/17/2009   Hypothyroidism 05/13/2009   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   Benign essential hypertension 05/13/2009   Right inguinal hernia 05/13/2009   HIATAL HERNIA 05/13/2009   NEPHROLITHIASIS 05/13/2009   ROSACEA 05/13/2009    PCP: [name], NP  REFERRING PROVIDER: [name], NP  REFERRING DIAG: R26.81 (ICD-10-CM) - Gait instability   THERAPY DIAG:  Unsteadiness on feet  Other abnormalities of gait and mobility  Muscle weakness (generalized)  Repeated falls  Other low back pain  RATIONALE FOR EVALUATION AND TREATMENT:  Rehabilitation  ONSET DATE:  Fall 01/12/22 (2 falls in past 6 months)  SUBJECTIVE:   SUBJECTIVE STATEMENT: No pain ever since having back injections.   PAIN:  Are you having pain? No  PERTINENT HISTORY:  Aortic atherosclerosis; carotid stenosis; cervical myelopathy s/p ACDF 06/2020 and posterior cervical fusion 11/2020; peripheral neuropathy; lumbar DDD/spondylosis; lumbago with sciatica/lumbar radiculopathy; OA; B TKA; BTHA; L greater trochanteric pain syndrome; L RCR 2014; recent CTR surgery - early May; hearing loss; HTN; BPPV/vertigo; B sacroiliitis; skin cancer; COPD/emphysema; sleep apnea with CPAP; GERD; gout; HLD; LE cramps; RLS  PRECAUTIONS: None  WEIGHT BEARING RESTRICTIONS: No  FALLS: Has patient fallen in last 6 months? Yes. 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 No acute intracranial abnormality.  2. No acute fracture or traumatic subluxation of the cervical spine. L wrist x-ray: 1. Mild dorsal wrist soft tissue swelling with small ossific density overlying the proximal carpal row. Correlate for triquetral fracture.  2. Moderate scaphotrapeziotrapezoid and mild first CMC joint osteoarthritis. L elbow x-ray: 1. No acute fracture or dislocation, left elbow.  2. Mild elevation of the anterior fat pad suggestive of a small joint effusion, which may be reactive in the setting of elbow osteoarthritis. L shoulder x-rays: No acute osseous abnormality.  COGNITION: Overall cognitive status: Within functional limits for tasks assessed and Impaired: Memory: Impaired: Short term Procedural   SENSATION: Peripheral neuropathy in B feet  COORDINATION: Mildly impaired with slow movements  EDEMA:  Mild to moderate edema in distal B LE  MUSCLE LENGTH: Hamstrings: mod tight R>L ITB: mod tight B Piriformis: mod tight L>R Hip flexors: mod tight B Quads: mod tight B Heelcord: mod tight B  POSTURE:  rounded shoulders, forward head, and flexed trunk   LOWER EXTREMITY MMT:   Tested in sitting on eval 02/22/22, 03/22/22 & 04/17/22 MMT Right 02/22/22 Left 02/22/22  Right 03/22/22 Left 03/22/22 Right 04/17/22 Left  04/17/22  Hip flexion 3+ 4- 3+ 4- 4- 4-  Hip extension 3- 3- 3+ 3+ 3+ 3+  Hip abduction 4- 4 4 4+ 4 4+  Hip adduction _0 4+ 4+ 4+  Hip internal rotation 3 3+ 3 3+ 3 3+  Hip external rotation 3+ 3+ 3+  3+ 3+ 3+  Knee flexion 4- 4- 4 4 4+ 4+  Knee extension 4- 4 4 4+ 4+ 4+  Ankle dorsiflexion 4- 4 4- 4 4 4-  Ankle plantarflexion 3+  6 SLS HR 4 10 SLS HR 4+ 20 SLS HR Limited lift on last few reps 5 20 SLS HR 5- 18 SLS HR 5 20 SLS HR  Ankle inversion 3+ 4- 3+ 4- 4- 4-  Ankle eversion 3+ 4- 3+ 4- 4- 4-  (Blank rows = not tested)  BED MOBILITY:  Sit to supine SBA Supine to sit SBA  TRANSFERS: Assistive device utilized: Single point cane and None  Sit to stand: Modified independence Stand to sit: Modified independence Chair to chair: Modified independence Floor:  NT  GAIT: Gait pattern: step through pattern, decreased stride length, decreased hip/knee flexion- Right, decreased hip/knee flexion- Left, decreased trunk rotation, and wide BOS Distance walked: 80 Assistive device utilized: Single point cane Level of assistance: SBA Comments: Gait speed: 2.10 ft/sec with SPC, 2.31 ft/sec w/o AD  RAMP: Level of Assistance:  NT Assistive device utilized:    Ramp Comments:   CURB:  Level of Assistance:  NT Assistive device utilized:    Curb Comments:   STAIRS:  Level of Assistance:  NT  Stair Negotiation Technique:   Number of Stairs:    Height of Stairs:   Comments:   FUNCTIONAL TESTS:  5 times sit to stand: 13.0 sec Timed up and go (TUG): 13.53 sec with SPC; 14.19 sec w/o AD 10 meter walk test: 15.60 sec with SPC; 13.04 sec w/o AD Berg Balance Scale: 39/56 - scores 37-45 indicate significant risk for falls (>80%) Dynamic Gait Index: 11/24; Scores of 19 or less are predictive of falls in older community living adults  PATIENT SURVEYS:  ABC scale 55% of self confidence; 45% impairment  TODAY'S TREATMENT:   04/20/22 Therapeutic Exercise: Nustep L5x33mn STS x 10 no UE support R/L SLS with opp leg staggered 2HA x 15 sec each leg Retro step x 10 bil 2HA Seated hamstring stretch 2x30" Standing hip ABD 2x10/ext x 10 w/ red TB  Sidesteps w/ Red TB 5x10" Step ups w/ 4' step x 10 each LE   04/17/22 THERAPEUTIC EXERCISE: to improve flexibility, strength and mobility.  Verbal and tactile cues throughout for technique. NuStep L6 x 6 min  THERAPEUTIC ACTIVITIES: 5 times sit to stand: 14.63 sec Timed up and go (TUG): 14.28 sec with SPC; 12.90 sec w/o AD 10 meter walk test: 10.96 sec with SPC; 10.43 sec w/o AD Gait speed: 2.99 ft/sec with SPC; 3.14 ft/sec w/o AD Berg Balance Scale: 48/56 - scores 46-51 = moderate fall risk (>50%)  Dynamic Gait Index: 18/24; Scores of 19 or less are predictive of falls in older community living adults MMT Goal assessment   04/11/22 Therapeutic Exercise: Nustep L5x638m Heel/toe raise x 10 at counter Standing chops with RTB x 10 each way Standing lat pull with RTB x 20 Standing shld ext with RTB x 20  Nuero Re-ed: SLS with opp leg reach to colored dots 5 ways each side 5x Tandem walk + retro gait at counter top 3x down/back - cues for heel/toe pattern with tandem walk   04/06/22 THERAPEUTIC EXERCISE: to improve flexibility, strength and mobility.  Verbal and tactile cues throughout for technique. NuStep L6 x 6 min Bridge + RTB hip ABD isometric 10 x 3", 2 sets - intermittent cramping in L HS noted on 1st but alleviated with cues to press  into flat foot rather than heels with lift TrA + RTB march 2 x 10 TrA + RTB bent knee fallouts 2 x 10 TrA + bent knee up/up/down/down to 90/90 position x 10 Hooklying dead bug x 10 L Hooklying HS stretch with strap 3 x 30 sec Standing RTB row 10 x 3", 2 sets - 2nd set in staggered stance Standing RTB scap retraction + shoulder extension to neutral 10 x 3", 2 sets - 2nd set in staggered stance Standing B RTB pallof press +  short arc rotation x 10   PATIENT EDUCATION: Education details: progress with PT and ongoing PT POC Person educated: Patient Education method: Explanation Education comprehension: verbalized understanding   HOME EXERCISE PROGRAM: Access Code: [email or web address] URL: [email or web address] Date: 03/22/2022 Prepared by: Annie Paras  Exercises - Standing Gastroc Stretch at Counter  - 1-2 x daily - 7 x weekly - 2-3 reps - 30 sec hold - Standing Soleus Stretch at Counter  - 1-2 x daily - 7 x weekly - 2-3 reps - 30 sec hold - Hip Flexor Stretch on Step  - 1-2 x daily - 7 x weekly - 2-3 reps - 30 sec hold - Side Stepping with Resistance at Ankles and Counter Support  - 1 x daily - 7 x weekly - 2 sets - 10 reps - Forward and Backward Step Over with Counter Support  - 1 x daily - 7 x weekly - 2 sets - 10 reps - Turning in Corner 360  - 1 x daily - 7 x weekly - 2 sets - 5 reps - Tandem Stance in Corner  - 1 x daily - 7 x weekly - 2 reps - 30 sec hold - Single Leg Stance with Support  - 1 x daily - 7 x weekly - 2 sets - 30 sec hold - Squat with Chair Touch  - 1 x daily - 7 x weekly - 2 sets - 10 reps - 3 sec hold - Seated Hamstring Stretch  - 1-2 x daily - 7 x weekly - 2 sets - 30 sec hold - Seated Hip Adductor Stretch  - 1-2 x daily - 7 x weekly - 3 reps - 30 sec hold - Seated Figure 4 Piriformis Stretch  - 1-2 x daily - 7 x weekly - 2 sets - 30 sec hold - Supine Piriformis Stretch with Foot on Ground  - 1-2 x daily - 7 x weekly - 2-3 reps - 30 sec hold - Supine Lower Trunk Rotation  - 1 x daily - 7 x weekly - 2 sets - 30 sec hold - Bent Knee Fallouts with Alternating Legs  - 1 x daily - 7 x weekly - 2 sets - 10 reps - 3 sec hold - Seated Hip Abduction with Resistance  - 1 x daily - 3-4 x weekly - 2 sets - 10 reps - 3-5 sec hold - Seated Single Leg Hip Abduction with Resistance  - 1 x daily - 3-4 x weekly - 2 sets - 10 reps - 3 sec hold - Seated March with Resistance  - 1 x daily - 3-4 x  weekly - 2 sets - 10 reps - 3 sec hold - Seated Shoulder Row with Anchored Resistance  - 1 x daily - 3-4 x weekly - 2 sets - 10 reps - 3-5 hold hold  Patient Education - Check for Safety   ASSESSMENT:  CLINICAL IMPRESSION: Pt responded well to treatment. No complaints of pain but showed signs  of instability and balance deficits. He tired out quickly with the church pews and SLS. CGA-SBA required for safety and cues given throughout session for form.   OBJECTIVE IMPAIRMENTS Abnormal gait, decreased activity tolerance, decreased balance, decreased endurance, decreased knowledge of condition, decreased knowledge of use of DME, decreased mobility, difficulty walking, decreased ROM, decreased strength, decreased safety awareness, increased fascial restrictions, impaired perceived functional ability, increased muscle spasms, impaired flexibility, improper body mechanics, postural dysfunction, and pain.   ACTIVITY LIMITATIONS carrying, lifting, bending, standing, squatting, stairs, transfers, and locomotion level  PARTICIPATION LIMITATIONS: cleaning, laundry, interpersonal relationship, community activity, and yard work  PERSONAL FACTORS Age, Fitness, Past/current experiences, Time since onset of injury/illness/exacerbation, and 3+ comorbidities: Aortic atherosclerosis; carotid stenosis; cervical myelopathy s/p ACDF 06/2020 and posterior cervical fusion 11/2020; peripheral neuropathy; lumbar DDD/spondylosis; lumbago with sciatica/lumbar radiculopathy; OA; B TKA; BTHA; L greater trochanteric pain syndrome; L RCR 2014; recent CTR surgery - early May; hearing loss; HTN; BPPV/vertigo; B sacroiliitis; skin cancer; COPD/emphysema; sleep apnea with CPAP; GERD; gout; HLD; LE cramps; RLS  are also affecting patient's functional outcome.   REHAB POTENTIAL: Good  CLINICAL DECISION MAKING: Evolving/moderate complexity  EVALUATION COMPLEXITY: Moderate  GOALS: Goals reviewed with patient? Yes  SHORT TERM  GOALS: Target date: 03/22/2022   Patient will be independent with initial HEP. Baseline:  Goal status: MET  03/22/22  2.  Patient will demonstrate decreased fall risk by scoring > 45/56 on Berg. Baseline: 39/56 (eval); 43/56 as of 03/22/22 Goal status: MET  04/17/22 - 48/56  3.  Patient will be educated on strategies to decrease risk of falls.  Baseline:  Goal status: MET  03/22/22 - Education provided last visit - Pt reports no concerns as he went through the home checklist other than the bathroom mat non-skid backing has disintegrated from repeated washing (he states he pushes it to the side while he is in the BR)   LONG TERM GOALS: Target date: 05/29/2022   Patient will be independent with advanced/ongoing HEP to improve outcomes and carryover.  Baseline:  Goal status: IN PROGRESS  2.  Patient will be able to ambulate 600' with LRAD with good safety to access community.  Baseline:  Goal status: IN PROGRESS  3.  Patient will be able to step up/down curb safely with LRAD for safety with community ambulation.  Baseline:  Goal status: MET  04/17/22  4.  Patient will demonstrate improved functional LE strength as demonstrated by overall MMT >/= 4 to 4+/5. Baseline:  Goal status: IN PROGRESS  04/17/22 - Progressing (refer to MMT chart)  5.  Patient will demonstrate at least 19/24 on DGI to improve gait stability and reduce risk for falls. Baseline: 11/24 (eval); 16/24 (03/31/22); 18/24 (04/17/22) Goal status: IN PROGRESS  04/17/22 - Progressing as above  6.  Patient will score >/= 52/56 on Berg Balance test to demonstrate lower risk of falls. (MCID= 8 points) .  Baseline: 39/56 (eval - 02/22/22); 43/56 (03/22/22); 48/56 (04/17/22) Goal status: IN PROGRESS  04/17/22 - Progressing as above  7.  Patient will report >67% of self confidence on ABC scale to demonstrate improved functional ability. Baseline: 55% self confidence (eval); 53.8 % (04/17/22) Goal status: IN PROGRESS  8.  Patient will  demonstrate gait speed of >/= 2.62 ft/sec (0.80 m/s) to be a safe community ambulator with decreased risk for recurrent falls.  Baseline: 2.10 ft/sec with SPC, 2.31 ft/sec w/o AD (eval); 2.90 ft/sec with SPC; 2.92 ft/sec w/o AD (03/31/22); 2.99 ft/sec with SPC;  3.14 ft/sec w/o AD (04/17/22) Goal status: MET  03/31/22   PLAN: PT FREQUENCY: 2x/week  PT DURATION: 8 weeks  PLANNED INTERVENTIONS: Therapeutic exercises, Therapeutic activity, Neuromuscular re-education, Balance training, Gait training, Patient/Family education, Joint mobilization, Stair training, Vestibular training, Canalith repositioning, Dry Needling, Electrical stimulation, Spinal manipulation, Cryotherapy, Moist heat, Taping, Ultrasound, Ionotophoresis 71m/ml Dexamethasone, Manual therapy, and Re-evaluation  PLAN FOR NEXT SESSION:  progress lumbopelvic/LE flexibility and strengthening; balance training; HEP review/update/modification as needed   BArtist Pais PTA 04/20/2022, 2:45 PM

## 2022-04-20 NOTE — ED Triage Notes (Signed)
Pt states was constipated, when was able to have a BM he had a large amount of dark blood. Has HS of same but this was a concerning amount.

## 2022-04-20 NOTE — Addendum Note (Signed)
Addended by: Trudie Buckler on: 04/20/2022 01:41 PM   Modules accepted: Orders

## 2022-04-20 NOTE — Patient Instructions (Signed)
Continue using CPAP nightly and greater than 4 hours each night Mask refitting ordered If your symptoms worsen or you develop new symptoms please let us know.   

## 2022-04-21 ENCOUNTER — Inpatient Hospital Stay (HOSPITAL_COMMUNITY): Payer: Medicare HMO

## 2022-04-21 ENCOUNTER — Telehealth: Payer: Self-pay

## 2022-04-21 ENCOUNTER — Encounter (HOSPITAL_COMMUNITY): Payer: Self-pay

## 2022-04-21 ENCOUNTER — Inpatient Hospital Stay (HOSPITAL_BASED_OUTPATIENT_CLINIC_OR_DEPARTMENT_OTHER)
Admission: EM | Admit: 2022-04-21 | Discharge: 2022-04-27 | DRG: 377 | Disposition: A | Discharge status: Home Health | Payer: Medicare HMO | Attending: Internal Medicine | Admitting: Internal Medicine

## 2022-04-21 ENCOUNTER — Encounter (HOSPITAL_BASED_OUTPATIENT_CLINIC_OR_DEPARTMENT_OTHER): Payer: Self-pay | Admitting: Emergency Medicine

## 2022-04-21 DIAGNOSIS — J439 Emphysema, unspecified: Secondary | ICD-10-CM | POA: Diagnosis present

## 2022-04-21 DIAGNOSIS — J690 Pneumonitis due to inhalation of food and vomit: Secondary | ICD-10-CM | POA: Diagnosis not present

## 2022-04-21 DIAGNOSIS — D126 Benign neoplasm of colon, unspecified: Secondary | ICD-10-CM

## 2022-04-21 DIAGNOSIS — I6521 Occlusion and stenosis of right carotid artery: Secondary | ICD-10-CM

## 2022-04-21 DIAGNOSIS — Z8616 Personal history of COVID-19: Secondary | ICD-10-CM | POA: Diagnosis not present

## 2022-04-21 DIAGNOSIS — R011 Cardiac murmur, unspecified: Secondary | ICD-10-CM

## 2022-04-21 DIAGNOSIS — R634 Abnormal weight loss: Secondary | ICD-10-CM

## 2022-04-21 DIAGNOSIS — I774 Celiac artery compression syndrome: Secondary | ICD-10-CM | POA: Diagnosis not present

## 2022-04-21 DIAGNOSIS — N200 Calculus of kidney: Secondary | ICD-10-CM | POA: Diagnosis not present

## 2022-04-21 DIAGNOSIS — D62 Acute posthemorrhagic anemia: Secondary | ICD-10-CM | POA: Diagnosis present

## 2022-04-21 DIAGNOSIS — K5791 Diverticulosis of intestine, part unspecified, without perforation or abscess with bleeding: Secondary | ICD-10-CM | POA: Diagnosis not present

## 2022-04-21 DIAGNOSIS — Z8719 Personal history of other diseases of the digestive system: Secondary | ICD-10-CM | POA: Diagnosis not present

## 2022-04-21 DIAGNOSIS — I471 Supraventricular tachycardia: Secondary | ICD-10-CM | POA: Diagnosis not present

## 2022-04-21 DIAGNOSIS — N281 Cyst of kidney, acquired: Secondary | ICD-10-CM | POA: Diagnosis not present

## 2022-04-21 DIAGNOSIS — K648 Other hemorrhoids: Secondary | ICD-10-CM | POA: Diagnosis not present

## 2022-04-21 DIAGNOSIS — E785 Hyperlipidemia, unspecified: Secondary | ICD-10-CM

## 2022-04-21 DIAGNOSIS — G4733 Obstructive sleep apnea (adult) (pediatric): Secondary | ICD-10-CM | POA: Diagnosis present

## 2022-04-21 DIAGNOSIS — J449 Chronic obstructive pulmonary disease, unspecified: Secondary | ICD-10-CM | POA: Diagnosis not present

## 2022-04-21 DIAGNOSIS — L989 Disorder of the skin and subcutaneous tissue, unspecified: Secondary | ICD-10-CM

## 2022-04-21 DIAGNOSIS — Z8619 Personal history of other infectious and parasitic diseases: Secondary | ICD-10-CM

## 2022-04-21 DIAGNOSIS — I10 Essential (primary) hypertension: Secondary | ICD-10-CM | POA: Diagnosis present

## 2022-04-21 DIAGNOSIS — M1990 Unspecified osteoarthritis, unspecified site: Secondary | ICD-10-CM

## 2022-04-21 DIAGNOSIS — R42 Dizziness and giddiness: Secondary | ICD-10-CM

## 2022-04-21 DIAGNOSIS — J342 Deviated nasal septum: Secondary | ICD-10-CM

## 2022-04-21 DIAGNOSIS — M5440 Lumbago with sciatica, unspecified side: Secondary | ICD-10-CM

## 2022-04-21 DIAGNOSIS — R578 Other shock: Secondary | ICD-10-CM | POA: Diagnosis not present

## 2022-04-21 DIAGNOSIS — Z9989 Dependence on other enabling machines and devices: Secondary | ICD-10-CM

## 2022-04-21 DIAGNOSIS — D120 Benign neoplasm of cecum: Secondary | ICD-10-CM | POA: Diagnosis present

## 2022-04-21 DIAGNOSIS — G9341 Metabolic encephalopathy: Secondary | ICD-10-CM | POA: Diagnosis not present

## 2022-04-21 DIAGNOSIS — R252 Cramp and spasm: Secondary | ICD-10-CM

## 2022-04-21 DIAGNOSIS — R2681 Unsteadiness on feet: Secondary | ICD-10-CM

## 2022-04-21 DIAGNOSIS — M79642 Pain in left hand: Secondary | ICD-10-CM

## 2022-04-21 DIAGNOSIS — M5416 Radiculopathy, lumbar region: Secondary | ICD-10-CM

## 2022-04-21 DIAGNOSIS — R339 Retention of urine, unspecified: Secondary | ICD-10-CM

## 2022-04-21 DIAGNOSIS — K4090 Unilateral inguinal hernia, without obstruction or gangrene, not specified as recurrent: Secondary | ICD-10-CM

## 2022-04-21 DIAGNOSIS — M5136 Other intervertebral disc degeneration, lumbar region: Secondary | ICD-10-CM

## 2022-04-21 DIAGNOSIS — M109 Gout, unspecified: Secondary | ICD-10-CM | POA: Diagnosis present

## 2022-04-21 DIAGNOSIS — K921 Melena: Secondary | ICD-10-CM | POA: Diagnosis not present

## 2022-04-21 DIAGNOSIS — K922 Gastrointestinal hemorrhage, unspecified: Secondary | ICD-10-CM | POA: Diagnosis not present

## 2022-04-21 DIAGNOSIS — Z96653 Presence of artificial knee joint, bilateral: Secondary | ICD-10-CM

## 2022-04-21 DIAGNOSIS — G629 Polyneuropathy, unspecified: Secondary | ICD-10-CM | POA: Diagnosis present

## 2022-04-21 DIAGNOSIS — M47816 Spondylosis without myelopathy or radiculopathy, lumbar region: Secondary | ICD-10-CM

## 2022-04-21 DIAGNOSIS — I700 Atherosclerosis of aorta: Secondary | ICD-10-CM | POA: Diagnosis present

## 2022-04-21 DIAGNOSIS — I499 Cardiac arrhythmia, unspecified: Secondary | ICD-10-CM

## 2022-04-21 DIAGNOSIS — N179 Acute kidney failure, unspecified: Secondary | ICD-10-CM | POA: Diagnosis not present

## 2022-04-21 DIAGNOSIS — M7062 Trochanteric bursitis, left hip: Secondary | ICD-10-CM

## 2022-04-21 DIAGNOSIS — K5790 Diverticulosis of intestine, part unspecified, without perforation or abscess without bleeding: Secondary | ICD-10-CM

## 2022-04-21 DIAGNOSIS — R195 Other fecal abnormalities: Secondary | ICD-10-CM

## 2022-04-21 DIAGNOSIS — F4321 Adjustment disorder with depressed mood: Secondary | ICD-10-CM

## 2022-04-21 DIAGNOSIS — E876 Hypokalemia: Secondary | ICD-10-CM | POA: Diagnosis not present

## 2022-04-21 DIAGNOSIS — Z87442 Personal history of urinary calculi: Secondary | ICD-10-CM

## 2022-04-21 DIAGNOSIS — Z87891 Personal history of nicotine dependence: Secondary | ICD-10-CM | POA: Diagnosis not present

## 2022-04-21 DIAGNOSIS — Z6825 Body mass index (BMI) 25.0-25.9, adult: Secondary | ICD-10-CM

## 2022-04-21 DIAGNOSIS — B351 Tinea unguium: Secondary | ICD-10-CM

## 2022-04-21 DIAGNOSIS — D122 Benign neoplasm of ascending colon: Secondary | ICD-10-CM

## 2022-04-21 DIAGNOSIS — R739 Hyperglycemia, unspecified: Secondary | ICD-10-CM

## 2022-04-21 DIAGNOSIS — H35353 Cystoid macular degeneration, bilateral: Secondary | ICD-10-CM

## 2022-04-21 DIAGNOSIS — H903 Sensorineural hearing loss, bilateral: Secondary | ICD-10-CM

## 2022-04-21 DIAGNOSIS — J3089 Other allergic rhinitis: Secondary | ICD-10-CM

## 2022-04-21 DIAGNOSIS — R1031 Right lower quadrant pain: Secondary | ICD-10-CM

## 2022-04-21 DIAGNOSIS — R55 Syncope and collapse: Secondary | ICD-10-CM

## 2022-04-21 DIAGNOSIS — E039 Hypothyroidism, unspecified: Secondary | ICD-10-CM | POA: Diagnosis present

## 2022-04-21 DIAGNOSIS — D649 Anemia, unspecified: Secondary | ICD-10-CM | POA: Diagnosis present

## 2022-04-21 DIAGNOSIS — R002 Palpitations: Secondary | ICD-10-CM

## 2022-04-21 DIAGNOSIS — M25559 Pain in unspecified hip: Secondary | ICD-10-CM

## 2022-04-21 DIAGNOSIS — K625 Hemorrhage of anus and rectum: Principal | ICD-10-CM

## 2022-04-21 DIAGNOSIS — Z85828 Personal history of other malignant neoplasm of skin: Secondary | ICD-10-CM

## 2022-04-21 DIAGNOSIS — I160 Hypertensive urgency: Secondary | ICD-10-CM | POA: Diagnosis not present

## 2022-04-21 DIAGNOSIS — K635 Polyp of colon: Secondary | ICD-10-CM | POA: Diagnosis not present

## 2022-04-21 DIAGNOSIS — G959 Disease of spinal cord, unspecified: Secondary | ICD-10-CM

## 2022-04-21 DIAGNOSIS — R0789 Other chest pain: Secondary | ICD-10-CM

## 2022-04-21 DIAGNOSIS — C801 Malignant (primary) neoplasm, unspecified: Secondary | ICD-10-CM

## 2022-04-21 DIAGNOSIS — K219 Gastro-esophageal reflux disease without esophagitis: Secondary | ICD-10-CM | POA: Diagnosis present

## 2022-04-21 DIAGNOSIS — G2581 Restless legs syndrome: Secondary | ICD-10-CM

## 2022-04-21 DIAGNOSIS — K5909 Other constipation: Secondary | ICD-10-CM | POA: Diagnosis present

## 2022-04-21 DIAGNOSIS — Z981 Arthrodesis status: Secondary | ICD-10-CM

## 2022-04-21 DIAGNOSIS — R7309 Other abnormal glucose: Secondary | ICD-10-CM

## 2022-04-21 DIAGNOSIS — R9431 Abnormal electrocardiogram [ECG] [EKG]: Secondary | ICD-10-CM

## 2022-04-21 DIAGNOSIS — H353211 Exudative age-related macular degeneration, right eye, with active choroidal neovascularization: Secondary | ICD-10-CM

## 2022-04-21 DIAGNOSIS — M66869 Spontaneous rupture of other tendons, unspecified lower leg: Secondary | ICD-10-CM

## 2022-04-21 DIAGNOSIS — G4731 Primary central sleep apnea: Secondary | ICD-10-CM

## 2022-04-21 DIAGNOSIS — L719 Rosacea, unspecified: Secondary | ICD-10-CM

## 2022-04-21 DIAGNOSIS — K5731 Diverticulosis of large intestine without perforation or abscess with bleeding: Secondary | ICD-10-CM | POA: Diagnosis not present

## 2022-04-21 DIAGNOSIS — G4730 Sleep apnea, unspecified: Secondary | ICD-10-CM

## 2022-04-21 DIAGNOSIS — R200 Anesthesia of skin: Secondary | ICD-10-CM

## 2022-04-21 DIAGNOSIS — Z79899 Other long term (current) drug therapy: Secondary | ICD-10-CM

## 2022-04-21 DIAGNOSIS — H35073 Retinal telangiectasis, bilateral: Secondary | ICD-10-CM

## 2022-04-21 DIAGNOSIS — Z0000 Encounter for general adult medical examination without abnormal findings: Secondary | ICD-10-CM

## 2022-04-21 DIAGNOSIS — M461 Sacroiliitis, not elsewhere classified: Secondary | ICD-10-CM

## 2022-04-21 DIAGNOSIS — D123 Benign neoplasm of transverse colon: Secondary | ICD-10-CM | POA: Diagnosis present

## 2022-04-21 HISTORY — DX: Gastrointestinal hemorrhage, unspecified: K92.2

## 2022-04-21 LAB — COMPREHENSIVE METABOLIC PANEL
ALT: 29 U/L (ref 0–44)
AST: 34 U/L (ref 15–41)
Albumin: 3.8 g/dL (ref 3.5–5.0)
Alkaline Phosphatase: 70 U/L (ref 38–126)
Anion gap: 7 (ref 5–15)
BUN: 35 mg/dL — ABNORMAL HIGH (ref 8–23)
CO2: 27 mmol/L (ref 22–32)
Calcium: 8.8 mg/dL — ABNORMAL LOW (ref 8.9–10.3)
Chloride: 105 mmol/L (ref 98–111)
Creatinine, Ser: 1.2 mg/dL (ref 0.61–1.24)
GFR, Estimated: >60 mL/min (ref >60)
Glucose, Bld: 110 mg/dL — ABNORMAL HIGH (ref 70–99)
Potassium: 3.9 mmol/L (ref 3.5–5.1)
Sodium: 139 mmol/L (ref 135–145)
Total Bilirubin: 0.5 mg/dL (ref 0.3–1.2)
Total Protein: 7.5 g/dL (ref 6.5–8.1)

## 2022-04-21 LAB — PREPARE RBC (CROSSMATCH)

## 2022-04-21 LAB — CBC WITH DIFFERENTIAL/PLATELET
Abs Immature Granulocytes: 0.03 10*3/uL (ref 0.00–0.07)
Abs Immature Granulocytes: 0.03 10*3/uL (ref 0.00–0.07)
Basophils Absolute: 0.1 10*3/uL (ref 0.0–0.1)
Basophils Absolute: 0.1 10*3/uL (ref 0.0–0.1)
Basophils Relative: 1 %
Basophils Relative: 1 %
Eosinophils Absolute: 0.2 10*3/uL (ref 0.0–0.5)
Eosinophils Absolute: 0.2 10*3/uL (ref 0.0–0.5)
Eosinophils Relative: 2 %
Eosinophils Relative: 3 %
HCT: 29.8 % — ABNORMAL LOW (ref 39.0–52.0)
HCT: 36 % — ABNORMAL LOW (ref 39.0–52.0)
Hemoglobin: 10 g/dL — ABNORMAL LOW (ref 13.0–17.0)
Hemoglobin: 12.1 g/dL — ABNORMAL LOW (ref 13.0–17.0)
Immature Granulocytes: 0 %
Immature Granulocytes: 0 %
Lymphocytes Relative: 20 %
Lymphocytes Relative: 26 %
Lymphs Abs: 1.7 10*3/uL (ref 0.7–4.0)
Lymphs Abs: 2.3 10*3/uL (ref 0.7–4.0)
MCH: 32.7 pg (ref 26.0–34.0)
MCH: 33.1 pg (ref 26.0–34.0)
MCHC: 33.6 g/dL (ref 30.0–36.0)
MCHC: 33.6 g/dL (ref 30.0–36.0)
MCV: 97.3 fL (ref 80.0–100.0)
MCV: 98.7 fL (ref 80.0–100.0)
Monocytes Absolute: 0.7 10*3/uL (ref 0.1–1.0)
Monocytes Absolute: 0.7 10*3/uL (ref 0.1–1.0)
Monocytes Relative: 8 %
Monocytes Relative: 9 %
Neutro Abs: 5.4 10*3/uL (ref 1.7–7.7)
Neutro Abs: 5.5 10*3/uL (ref 1.7–7.7)
Neutrophils Relative %: 63 %
Neutrophils Relative %: 67 %
Platelets: 301 10*3/uL (ref 150–400)
Platelets: 338 10*3/uL (ref 150–400)
RBC: 3.02 MIL/uL — ABNORMAL LOW (ref 4.22–5.81)
RBC: 3.7 MIL/uL — ABNORMAL LOW (ref 4.22–5.81)
RDW: 13.2 % (ref 11.5–15.5)
RDW: 13.3 % (ref 11.5–15.5)
WBC: 8.2 10*3/uL (ref 4.0–10.5)
WBC: 8.6 10*3/uL (ref 4.0–10.5)
nRBC: 0 % (ref 0.0–0.2)
nRBC: 0 % (ref 0.0–0.2)

## 2022-04-21 LAB — CBC
HCT: 26 % — ABNORMAL LOW (ref 39.0–52.0)
Hemoglobin: 8.6 g/dL — ABNORMAL LOW (ref 13.0–17.0)
MCH: 33.1 pg (ref 26.0–34.0)
MCHC: 33.1 g/dL (ref 30.0–36.0)
MCV: 100 fL (ref 80.0–100.0)
Platelets: 285 10*3/uL (ref 150–400)
RBC: 2.6 MIL/uL — ABNORMAL LOW (ref 4.22–5.81)
RDW: 13.2 % (ref 11.5–15.5)
WBC: 18 10*3/uL — ABNORMAL HIGH (ref 4.0–10.5)
nRBC: 0 % (ref 0.0–0.2)

## 2022-04-21 LAB — GLUCOSE, CAPILLARY
Glucose-Capillary: 150 mg/dL — ABNORMAL HIGH (ref 70–99)
Glucose-Capillary: 150 mg/dL — ABNORMAL HIGH (ref 70–99)

## 2022-04-21 LAB — OCCULT BLOOD X 1 CARD TO LAB, STOOL: Fecal Occult Bld: POSITIVE — AB

## 2022-04-21 LAB — MRSA NEXT GEN BY PCR, NASAL: MRSA by PCR Next Gen: NOT DETECTED

## 2022-04-21 LAB — HEMOGLOBIN AND HEMATOCRIT, BLOOD
HCT: 27.1 % — ABNORMAL LOW (ref 39.0–52.0)
Hemoglobin: 8.8 g/dL — ABNORMAL LOW (ref 13.0–17.0)

## 2022-04-21 LAB — PROTIME-INR
INR: 1 (ref 0.8–1.2)
Prothrombin Time: 12.6 seconds (ref 11.4–15.2)

## 2022-04-21 MED ORDER — ALLOPURINOL 100 MG PO TABS
100.0000 mg | ORAL_TABLET | Freq: Two times a day (BID) | ORAL | Status: DC
  Administered 2022-04-22 – 2022-04-27 (×9): 100 mg via ORAL
  Filled 2022-04-21 (×10): qty 1

## 2022-04-21 MED ORDER — SODIUM CHLORIDE 0.9% IV SOLUTION: Freq: Once | INTRAVENOUS | Status: DC

## 2022-04-21 MED ORDER — HYDRALAZINE HCL 25 MG PO TABS: 25.0000 mg | ORAL_TABLET | Freq: Four times a day (QID) | ORAL | Status: DC | PRN

## 2022-04-21 MED ORDER — NOREPINEPHRINE 4 MG/250ML-% IV SOLN
2.0000 ug/min | INTRAVENOUS | Status: DC
  Filled 2022-04-21: qty 250

## 2022-04-21 MED ORDER — NOREPINEPHRINE 4 MG/250ML-% IV SOLN: 0.0000 ug/min | INTRAVENOUS | Status: DC

## 2022-04-21 MED ORDER — ATROPINE SULFATE 1 MG/10ML IJ SOSY
PREFILLED_SYRINGE | INTRAMUSCULAR | Status: AC
  Filled 2022-04-21: qty 10

## 2022-04-21 MED ORDER — IPRATROPIUM-ALBUTEROL 0.5-2.5 (3) MG/3ML IN SOLN
3.0000 mL | Freq: Four times a day (QID) | RESPIRATORY_TRACT | Status: DC
  Administered 2022-04-21 – 2022-04-22 (×4): 3 mL via RESPIRATORY_TRACT
  Filled 2022-04-21 (×5): qty 3

## 2022-04-21 MED ORDER — FENTANYL CITRATE PF 50 MCG/ML IJ SOSY
PREFILLED_SYRINGE | INTRAMUSCULAR | Status: AC
  Filled 2022-04-21: qty 2

## 2022-04-21 MED ORDER — SODIUM CHLORIDE 0.9 % IV SOLN
250.0000 mL | INTRAVENOUS | Status: DC
  Administered 2022-04-24 – 2022-04-25 (×2): 250 mL via INTRAVENOUS

## 2022-04-21 MED ORDER — ALBUTEROL SULFATE HFA 108 (90 BASE) MCG/ACT IN AERS: 1.0000 | INHALATION_SPRAY | Freq: Four times a day (QID) | RESPIRATORY_TRACT | Status: DC | PRN

## 2022-04-21 MED ORDER — ONDANSETRON 4 MG PO TBDP: 4.0000 mg | ORAL_TABLET | Freq: Three times a day (TID) | ORAL | Status: DC | PRN

## 2022-04-21 MED ORDER — POLYETHYLENE GLYCOL 3350 17 G PO PACK
17.0000 g | PACK | Freq: Every day | ORAL | Status: DC | PRN
  Filled 2022-04-21: qty 1

## 2022-04-21 MED ORDER — PANTOPRAZOLE SODIUM 40 MG PO TBEC: 40.0000 mg | DELAYED_RELEASE_TABLET | Freq: Two times a day (BID) | ORAL | Status: DC

## 2022-04-21 MED ORDER — SODIUM CHLORIDE 0.9 % IV SOLN: INTRAVENOUS | Status: DC

## 2022-04-21 MED ORDER — LEVOTHYROXINE SODIUM 100 MCG/5ML IV SOLN
75.0000 ug | INTRAVENOUS | Status: DC
  Administered 2022-04-22: 75 ug via INTRAVENOUS
  Filled 2022-04-21: qty 5

## 2022-04-21 MED ORDER — NOREPINEPHRINE 4 MG/250ML-% IV SOLN
2.0000 ug/min | INTRAVENOUS | Status: DC
  Filled 2022-04-21 (×3): qty 250

## 2022-04-21 MED ORDER — MECLIZINE HCL 25 MG PO TABS: 25.0000 mg | ORAL_TABLET | Freq: Three times a day (TID) | ORAL | Status: DC | PRN

## 2022-04-21 MED ORDER — EPINEPHRINE PF 1 MG/ML IJ SOLN: 1.0000 mg | Freq: Once | INTRAMUSCULAR | Status: DC

## 2022-04-21 MED ORDER — ETOMIDATE 2 MG/ML IV SOLN
INTRAVENOUS | Status: DC
Start: 2022-04-21 — End: 2022-04-21
  Filled 2022-04-21: qty 20

## 2022-04-21 MED ORDER — SUCCINYLCHOLINE CHLORIDE 200 MG/10ML IV SOSY
PREFILLED_SYRINGE | INTRAVENOUS | Status: DC
Start: 2022-04-21 — End: 2022-04-21
  Filled 2022-04-21: qty 10

## 2022-04-21 MED ORDER — ROCURONIUM BROMIDE 10 MG/ML (PF) SYRINGE
PREFILLED_SYRINGE | INTRAVENOUS | Status: DC
Start: 2022-04-21 — End: 2022-04-21
  Filled 2022-04-21: qty 10

## 2022-04-21 MED ORDER — GABAPENTIN 300 MG PO CAPS
300.0000 mg | ORAL_CAPSULE | Freq: Two times a day (BID) | ORAL | Status: DC
  Administered 2022-04-22 – 2022-04-23 (×3): 300 mg via ORAL
  Filled 2022-04-21 (×4): qty 1

## 2022-04-21 MED ORDER — MIDAZOLAM HCL 2 MG/2ML IJ SOLN
INTRAMUSCULAR | Status: DC
Start: 2022-04-21 — End: 2022-04-21
  Filled 2022-04-21: qty 2

## 2022-04-21 MED ORDER — SCOPOLAMINE 1 MG/3DAYS TD PT72
1.0000 | MEDICATED_PATCH | TRANSDERMAL | Status: DC
  Administered 2022-04-21: 1.5 mg via TRANSDERMAL
  Filled 2022-04-21 (×2): qty 1

## 2022-04-21 MED ORDER — ACETAMINOPHEN 500 MG PO TABS
1000.0000 mg | ORAL_TABLET | Freq: Every morning | ORAL | Status: DC
  Administered 2022-04-22 – 2022-04-27 (×4): 1000 mg via ORAL
  Filled 2022-04-21 (×4): qty 2

## 2022-04-21 MED ORDER — TRAMADOL HCL 50 MG PO TABS
50.0000 mg | ORAL_TABLET | Freq: Two times a day (BID) | ORAL | Status: DC | PRN
  Administered 2022-04-21 – 2022-04-26 (×2): 50 mg via ORAL
  Filled 2022-04-21 (×2): qty 1

## 2022-04-21 MED ORDER — SODIUM CHLORIDE 0.9 % IV SOLN
3.0000 g | Freq: Three times a day (TID) | INTRAVENOUS | Status: DC
  Administered 2022-04-21 – 2022-04-26 (×15): 3 g via INTRAVENOUS
  Filled 2022-04-21 (×14): qty 8

## 2022-04-21 MED ORDER — ONDANSETRON HCL 4 MG/2ML IJ SOLN
INTRAMUSCULAR | Status: AC
  Filled 2022-04-21: qty 2

## 2022-04-21 MED ORDER — CYCLOSPORINE 0.05 % OP EMUL
1.0000 [drp] | Freq: Two times a day (BID) | OPHTHALMIC | Status: DC
Start: 2022-04-21 — End: 2022-04-27
  Administered 2022-04-21 – 2022-04-27 (×9): 1 [drp] via OPHTHALMIC
  Filled 2022-04-21 (×13): qty 30

## 2022-04-21 MED ORDER — SODIUM CHLORIDE 0.9 % IV BOLUS
1000.0000 mL | Freq: Once | INTRAVENOUS | Status: AC
  Administered 2022-04-21: 1000 mL via INTRAVENOUS

## 2022-04-21 MED ORDER — SALINE SPRAY 0.65 % NA SOLN
1.0000 | Freq: Every evening | NASAL | Status: DC | PRN
Start: 2022-04-21 — End: 2022-04-27

## 2022-04-21 MED ORDER — PANTOPRAZOLE SODIUM 40 MG PO TBEC
40.0000 mg | DELAYED_RELEASE_TABLET | Freq: Two times a day (BID) | ORAL | Status: DC
Start: 2022-04-21 — End: 2022-04-21

## 2022-04-21 MED ORDER — LEVOTHYROXINE SODIUM 75 MCG PO TABS: 150.0000 ug | ORAL_TABLET | Freq: Every day | ORAL | Status: DC

## 2022-04-21 MED ORDER — IOHEXOL 350 MG/ML SOLN
100.0000 mL | Freq: Once | INTRAVENOUS | Status: AC | PRN
  Administered 2022-04-21: 100 mL via INTRAVENOUS

## 2022-04-21 MED ORDER — ATROPINE SULFATE 0.4 MG/ML IV SOLN
0.4000 mg | Freq: Once | INTRAVENOUS | Status: AC
  Administered 2022-04-21: 0.4 mg via INTRAVENOUS

## 2022-04-21 MED ORDER — MIRABEGRON ER 25 MG PO TB24
25.0000 mg | ORAL_TABLET | Freq: Every day | ORAL | Status: DC
  Administered 2022-04-22 – 2022-04-27 (×4): 25 mg via ORAL
  Filled 2022-04-21 (×7): qty 1

## 2022-04-21 MED ORDER — ORAL CARE MOUTH RINSE: 15.0000 mL | OROMUCOSAL | Status: DC | PRN

## 2022-04-21 MED ORDER — PANTOPRAZOLE SODIUM 40 MG IV SOLR
40.0000 mg | Freq: Two times a day (BID) | INTRAVENOUS | Status: DC
Start: 2022-04-21 — End: 2022-04-26
  Administered 2022-04-21 – 2022-04-26 (×9): 40 mg via INTRAVENOUS
  Filled 2022-04-21 (×9): qty 10

## 2022-04-21 MED ORDER — ALBUTEROL SULFATE (2.5 MG/3ML) 0.083% IN NEBU: 2.5000 mg | INHALATION_SOLUTION | Freq: Four times a day (QID) | RESPIRATORY_TRACT | Status: DC | PRN

## 2022-04-21 MED ORDER — ROSUVASTATIN CALCIUM 5 MG PO TABS
5.0000 mg | ORAL_TABLET | Freq: Every day | ORAL | Status: DC
  Administered 2022-04-22 – 2022-04-27 (×4): 5 mg via ORAL
  Filled 2022-04-21 (×7): qty 1

## 2022-04-21 MED ORDER — ASPIRIN 81 MG PO TBEC
81.0000 mg | DELAYED_RELEASE_TABLET | Freq: Every morning | ORAL | Status: DC
Start: 2022-04-22 — End: 2022-04-21

## 2022-04-21 MED ORDER — ONDANSETRON HCL 4 MG/2ML IJ SOLN
4.0000 mg | Freq: Four times a day (QID) | INTRAMUSCULAR | Status: DC | PRN
  Administered 2022-04-21 – 2022-04-22 (×2): 4 mg via INTRAVENOUS
  Filled 2022-04-21 (×2): qty 2

## 2022-04-21 MED ORDER — DICLOFENAC SODIUM 1 % EX GEL: 2.0000 g | Freq: Every day | CUTANEOUS | Status: DC | PRN

## 2022-04-21 NOTE — Progress Notes (Signed)
Pharmacy Antibiotic Note  [name] Pratt is a 81 y.o. male admitted on 04/21/2022 with pneumonia.  Pharmacy has been consulted for unasyn dosing.  Pt was admitted for GIB. CT showed PNA. Unasyn has been ordered  Scr 1.2 Wbc wnl  Plan: Unasyn 3g IV q8 F/u LOT  Height: '5\' 6"'$  (167.6 cm) Weight: 66.4 kg (146 lb 6.4 oz) IBW/kg (Calculated) : 63.8  Temp (24hrs), Avg:97.8 F (36.6 C), Min:97.6 F (36.4 C), Max:98 F (36.7 C)  Recent Labs  Lab 04/21/22 0043 04/21/22 0950  WBC 8.6 8.2  CREATININE 1.20  --     Estimated Creatinine Clearance: 42.8 mL/min (by C-G formula based on SCr of 1.2 mg/dL).    Allergies  Allergen Reactions   Pravastatin Other (See Comments)    Muscle cramps   Sildenafil Other (See Comments)    Tachycardia     Oxycodone Other (See Comments)    Hallucinations    Atenolol Other (See Comments)    Bradycardia    Elastic Bandages & [Zinc] Other (See Comments)    Teflon bandages - Irritation   Metoclopramide Hcl Anxiety    Antimicrobials this admission: 8/11 unasyn>>  Dose adjustments this admission:   Microbiology results: MRSA neg  Onnie Boer, PharmD, BCIDP, AAHIVP, CPP Infectious Disease Pharmacist 04/21/2022 7:19 PM

## 2022-04-21 NOTE — ED Notes (Signed)
Pt ambulatory to BR agagin another stool with BRBlood

## 2022-04-21 NOTE — ED Notes (Signed)
Spoke to pts wife to update her, about his bed at [facility] and possible transfer time

## 2022-04-21 NOTE — Telephone Encounter (Signed)
Patient currently in ER for evaluation/treatment.   [facility] Night - Client TELEPHONE ADVICE RECORD AccessNurse Patient Name:[name] Pratt  Initial Comment Caller states he is constipated and has blood in his tool. Translation No Nurse Assessment Nurse: Files, RN, Dustin Date/Time (Eastern Time): 04/20/2022 10:33:25 PM Confirm and document reason for call. If symptomatic, describe symptoms. ---Caller states he had some blood on his stool, happen before, figure its might be hemorrhoids, the second time he sat down a lot of blood come out. Does the patient have any new or worsening symptoms? ---Yes Will a triage be completed? ---Yes Related visit to physician within the last 2 weeks? ---No Does the PT have any chronic conditions? (i.e. diabetes, asthma, this includes High risk factors for pregnancy, etc.) ---No Is this a behavioral health or substance abuse call? ---No Guidelines Guideline Title Affirmed Question Affirmed Notes Nurse Date/Time Eilene Ghazi Time) Rectal Bleeding [1] MODERATE rectal bleeding (small blood clots, passing blood without stool, or toilet water turns red) AND [2] more than once a day Files, RN, Dustin 04/20/2022 10:35:02 PM Disp. Time Eilene Ghazi Time) Disposition Final User 04/20/2022 10:39:05 PM Go to ED Now Yes Files, RN, Rachel Bo PLEASE NOTE: All timestamps contained within this report are represented as [place] Standard Time. CONFIDENTIALTY NOTICE: This fax transmission is intended only for the addressee. It contains information that is legally privileged, confidential or otherwise protected from use or disclosure. If you are not the intended recipient, you are strictly prohibited from reviewing, disclosing, copying using or disseminating any of this information or taking any action in reliance on or regarding this information. If you have received this fax in error, please notify us immediately by telephone so that we can  arrange for its return to [place]. Phone: [phone], Toll-Free: [phone], Fax: [phone] Page: 2 of 2 Call Id: [id number] Final Disposition 04/20/2022 10:39:05 PM Go to ED Now Yes Files, RN, York Grice Disagree/Comply Comply Caller Understands Yes PreDisposition InappropriateToAsk Care Advice Given Per Guideline GO TO ED NOW: * You need to be seen in the Emergency Department. * Go to the ED at ___________ [place] now. Drive carefully. CARE ADVICE given per Rectal Bleeding (Adult) guideline. Referrals [facility] - ED

## 2022-04-21 NOTE — Consult Note (Addendum)
Attending physician's note   I have taken a history, reviewed the chart, and examined the patient. I performed a substantive portion of this encounter, including complete performance of at least one of the key components, in conjunction with the APP. I agree with the APP's note, impression, and recommendations with my edits.   81 year old male with medical history as outlined below, to include history of OSA on CPAP, hypothyroidism, HTN, GERD, diverticulosis, HHR 1985, ACDF 2021, [facility], presenting to [facility] ER earlier today with painless hematochezia.  No prior similar symptoms.  Aside from ASA 81 mg, no antiplatelets or anticoagulation.  Admission labs noted for following: - H/H 12.1/36 which decreased to 10/29.8 this afternoon - BUN/creatinine 35/1.2 - Normal liver enzymes  Comparison labs from 03/15/2022: - H/H 12.1/36.8 - BUN 34  Patient had syncopal episode while on the commode and passing large volume BRBPR this afternoon.  He slowly regained consciousness and was helped to the bed by nursing staff.  1) Hematochezia 2) Acute blood loss anemia 3) Syncope 4) Hemorrhagic shock - CT angiography stat, with IR consult if positive - Continue trending CBC with blood products as needed per protocol - IV fluids now - Transfer to ICU - If CT negative, plan for colonoscopy +/- EGD on this admission - GI service will continue to follow   [name], DO, [place] [phone] office                                                     Consultation Note   Referring Provider: [facility] PCP: [name], NP Primary Gastroenterologist: Dr. Earlean Shawl  Reason for consultation: Gi bleed  Hospital Day: 1  Assessment / Plan   # 81 yo male with painless rectal bleeding x 24 hours. Suspect diverticular hemorrhage. He has known pan-diverticulosis. His BUN is elevated but upper GI bleed seems less likely.  He had a complete colonoscopy with a good prep in  Aug 2021 ( done for anemia and FOBT+) No polyps or cancers founds, + large hemorrhoids and diverticulosis If continues to bleed may need CTA.  Hopefully bleeding will resolved spontaneously. Colonoscopy may not be needed since he just had one two years ago  # Acute on chronic [place] anemia. Hgb 10, down from baseline of 12.  With persistent bleeding I suspect hgb will decline further. Monitor hgb and transfuse if needed.   # See PMH for additional medical problems   HPI   [name] Pratt is a 81 y.o. male with a past medical history significant for diverticulosis, hypothyroidism, HTN, remote hiatal hernia repair See PMH for any additional medical problems.  Patient presented to [facility] ED today for evaluation of rectal bleeding. Transferred to [facility].   ED Evaluation Hemodynamically stable WBC 8.6 Hgb 12.1 initially around MN, then 10.0 ~ 10am MCV 98, platelets 301.  BUN 35 ( elevated in past), Cr 1.2 LFTs normal.   [name] Pratt began having painless hematochezia yesterday. He had two or two episodes prior to getting to ED. He has continued to bleeding. The isn't really passing any stool, just red blood. No nausea / vomiting. He has mild rectal burning at time. He struggles with chronic constipation defined as straining. He takes Miralax as needed. He takes a daily baby asa, no other NSAIDS. No anticoagulated  Previous  GI Evaluation    Most recent colonoscopy  Aug 2021 Dr. Earlean Shawl Good prep Tortuous colon.  Pan-diverticulosis, large internal hemorrhoids  Recent Labs and Imaging Intravitreal Injection, Pharmacologic Agent - OD - Right Eye  Result Date: 03/27/2022 Time Out 03/27/2022. 11:03 AM. Confirmed correct patient, procedure, site, and patient consented. Anesthesia Topical anesthesia was used. Anesthetic medications included Lidocaine 4%. Procedure Preparation included 5% betadine to ocular surface, 10% betadine to eyelids, Tobramycin 0.3%. A 30 gauge needle was used. Injection: 2.5 mg  bevacizumab 2.5 MG/0.1ML   Route: Intravitreal, Site: Right Eye   NDC: [phone], Lot: [id number], Expiration date: 05/12/2022 Post-op Post injection exam found visual acuity of at least counting fingers, no retinal detachment, perfused optic nerve. The patient tolerated the procedure well. There were no complications. The patient received written and verbal post procedure care education. Post injection medications included ocuflox.   OCT, Retina - OU - Both Eyes  Result Date: 03/27/2022 Right Eye Quality was good. Scan locations included subfoveal. Central Foveal Thickness: 227. Progression has improved. Findings include abnormal foveal contour, cystoid macular edema. Left Eye Quality was good. Scan locations included subfoveal. Central Foveal Thickness: 222. Progression has been stable. Findings include abnormal foveal contour, cystoid macular edema. 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  We will repeat to control the peripapillary CNVM. Chronic MAC-TEL continues OU   Labs:  Recent Labs    04/21/22 0043 04/21/22 0950  WBC 8.6 8.2  HGB 12.1* 10.0*  HCT 36.0* 29.8*  PLT 338 301   Recent Labs    04/21/22 0043  NA 139  K 3.9  CL 105  CO2 27  GLUCOSE 110*  BUN 35*  CREATININE 1.20  CALCIUM 8.8*   Recent Labs    04/21/22 0043  PROT 7.5  ALBUMIN 3.8  AST 34  ALT 29  ALKPHOS 70  BILITOT 0.5   No results for input(s): "HEPBSAG", "HCVAB", "HEPAIGM", "HEPBIGM" in the last 72 hours. Recent Labs    04/21/22 0043  LABPROT 12.6  INR 1.0    Past Medical History:  Diagnosis Date   Allergic rhinitis 02/02/2016   Anemia 12/17/2009   Formatting of this note might be different from the original. 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 Hypertension   Benign paroxysmal positional vertigo 09/14/2021   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[place]) 07/22/2021   Body mass index (BMI) 25.0-25.9, adult 11/02/2020   Cancer ([place])    skin cancer   Cardiac murmur 07/25/2021   Carotid stenosis    a. Carotid U/S 3/57: LICA < 01%, RICA 77-93%;  b.  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   Cystoid macular edema of both eyes 07/28/2020   Degenerative disc disease, lumbar 06/30/2015   Deviated nasal septum 02/18/2015   Diverticulosis 03/03/2020  DJD (degenerative joint disease) of hip 12/24/2012   Dyspnea 06/12/2011   Emphysema, unspecified ([place]) 03/05/2020   Erectile dysfunction 06/20/2017   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[place]) 02/09/2021   Gait disorder 03/02/2021   GERD (gastroesophageal reflux disease)    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   Qualifier: Diagnosis of  By: Burnett Kanaris     Greater trochanteric pain syndrome 07/22/2021   Hand pain, left 01/16/2022   Hearing loss 02/02/2016   Heme positive stool 02/29/2020   HIATAL HERNIA 05/13/2009   Qualifier: Diagnosis of  By: Burnett Kanaris     High risk medication use 01/18/2012   Hip pain 03/02/2021   History of chicken pox    History of COVID-19 12/05/2021   History of hemorrhoids    History of kidney stones    History of skin cancer 07/22/2021   skin cancer   History of total bilateral knee replacement 07/08/2020   Hyperglycemia 03/03/2014    Hyperlipidemia with target LDL less than 130 12/27/2010   Formatting of this note might be different from the original. 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 Osteoarthritis Of The Hand  10/1 IMO update   Osteoarthrosis, unspecified whether generalized or localized, pelvic region and thigh 07/25/2010   Formatting of this note might be different from the original. Osteoarthritis Of The Hip   Other abnormal glucose 07/22/2021   Other allergic rhinitis 02/18/2015   Palpitations 07/25/2021   Peripheral neuropathy 06/19/2016   Preventative health care 11/24/2015   Right groin pain 11/29/2012   Right inguinal hernia 05/13/2009   Qualifier: Diagnosis of  By: Burnett Kanaris     RLS (restless legs syndrome) 02/18/2015   Rosacea    Rotator cuff tear 07/27/2013   Situational depression 03/02/2021   Skin lesion 01/16/2022   Sleep apnea with use of continuous positive airway pressure (CPAP) 07/15/2013   CPAP set to  7 cm water,  Residual AHi was 7.8  With no leak.  User time 6 hours.  479 days , not used only 12 days - highly compliant 06-23-13  .    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   Urinary retention 11/26/2020   Ventral hernia 07/08/2012   Vertigo 07/08/2021   Weight loss 03/03/2020    Past Surgical History:  Procedure Laterality Date   ABDOMINAL HERNIA REPAIR  07/15/12   Dr [name]   ANTERIOR CERVICAL DECOMP/DISCECTOMY FUSION N/A 07/09/2020   Procedure: ANTERIOR CERVICAL DECOMPRESSION AND FUSION CERVICAL THREE-FOUR.;  Surgeon: [name], MD;  Location: [facility];  Service: Neurosurgery;  Laterality: N/A;  anterior   BROW LIFT  05/07/01   CYSTOSCOPY WITH URETEROSCOPY AND STENT PLACEMENT Right 01/28/2014   Procedure: CYSTOSCOPY WITH URETEROSCOPY, BASKET RETRIVAL AND  STENT PLACEMENT;  Surgeon: [name], MD;  Location: [facility];  Service: Urology;  Laterality: Right;   epidural injections     multiple procedures   ESOPHAGEAL DILATION  1993 and 1994   multiple times   EYE SURGERY Bilateral 03/25/10, 2012   cataract removal   HEMORRHOID SURGERY  2014   HIATAL HERNIA REPAIR  12/21/93   HOLMIUM LASER APPLICATION Right 5/46/5035   Procedure: HOLMIUM LASER APPLICATION;  Surgeon: [name], MD;  Location: [facility];  Service: Urology;  Laterality: Right;   INGUINAL HERNIA REPAIR Right 07/15/12   Dr [name], x2   JOINT REPLACEMENT  07/25/04   right knee   JOINT REPLACEMENT  07/13/10   left knee   [place]  09/20/06   with intraoperative cholangiogram and right inguinal herniorrhaphy with mesh    LIGAMENT REPAIR Left 07/2013   shoulder   LITHOTRIPSY     x2   [place] FUSION/FORAMINOTOMY N/A 11/24/2020   Procedure: Posterior Cervical Laminectomy Cervical three-four, Cervical four-five with lateral mass fusion;  Surgeon: [name], MD;  Location: [facility];  Service: Neurosurgery;  Laterality: N/A;   REFRACTIVE SURGERY Bilateral    Right total hip replacement  4/14   SKIN CANCER  DESTRUCTION     nose, ear   TONSILLECTOMY  as child   TOTAL HIP ARTHROPLASTY Left    URETHRAL DILATION  1991   [name]    Family History  Problem Relation Age of Onset   Cancer Mother    Hypertension Other    Cancer Other    Osteoarthritis Other     Prior to Admission medications   Medication Sig Start Date End Date Taking? Authorizing Provider  acetaminophen (TYLENOL) 650 MG CR tablet Take 1,300 mg by mouth every morning. And take 650 mg at bedtime    [provider]  albuterol (VENTOLIN HFA) 108 (90 Base) MCG/ACT inhaler INHALE 2 PUFFS INTO THE LUNGS EVERY 6 HOURS AS NEEDED FOR SHORTNESS OF BREATH. 10/20/21   [name], NP  Alcohol Swabs (ALCOHOL PADS) 70 % PADS 1 packet by Does not apply route 2 (two) times daily. 11/22/20   [name], NP  allopurinol (ZYLOPRIM) 100 MG tablet TAKE 1 TABLET TWICE DAILY 04/17/22   [name], NP  amLODipine (NORVASC) 5 MG tablet Take 1 tablet (5 mg total) by mouth daily. Patient not taking: Reported on 04/20/2022 01/26/22   Revankar, Reita Cliche, MD  ampicillin (PRINCIPEN) 500 MG capsule Take 1 capsule (500 mg total) by mouth 4 (four) times daily. 12/08/21   [name], NP  aspirin EC 81 MG tablet Take 81 mg by mouth every morning.    [provider]  Cholecalciferol (VITAMIN D) 50 MCG (2000 UT) CAPS Take 2,000 Units by mouth daily.    [provider]  colchicine 0.6 MG tablet Take 0.6 mg by mouth as needed for other. For gout    [provider]  cycloSPORINE (RESTASIS) 0.05 % ophthalmic emulsion Place 1 drop into both eyes 2 (two) times daily.    [provider]  diclofenac Sodium (VOLTAREN) 1 % GEL Apply 1 application topically daily as needed for pain (Hand pain).      fluocinonide (LIDEX) 0.05 % external  solution Apply 1 application topically 2 (two) times daily as needed for itching or pain. 06/13/21   [provider]  gabapentin (NEURONTIN) 300 MG capsule TAKE 1  CAPSULE TWO TO THREE TIMES DAILY AS NEEDED 11/25/21   [name], NP  GEMTESA 75 MG TABS Take 1 tablet by mouth daily. 10/20/21   [provider]  glucose blood test strip TRUE Metrix Blood Glucose Test Strip, use as instructed 11/12/20   [name], NP  Homeopathic Products ([place]) FOAM Apply 1 application topically 2 (two) times daily as needed (Pain).    [provider]  hydrALAZINE (APRESOLINE) 50 MG tablet Take 1 tablet (50 mg total) by mouth 2 (two) times daily. 01/17/22   [name], NP  Lancets MISC 1 each by Does not apply route 2 (two) times daily. TRUE matrix lancets 11/12/20   [name], NP  levothyroxine (SYNTHROID) 150 MCG tablet Take 150 mcg 6 days a week (patient takes 75 mcg once a week) Disp #84 for 90 day supply 12/01/21   [name], NP  levothyroxine (SYNTHROID) 75 MCG tablet Take 75 mcg once a week (patient on 150 mcg 6 days a week) Dispense #24 for 90 day supply. 12/01/21   [name], NP  losartan (COZAAR) 100 MG tablet TAKE 1 TABLET EVERY DAY 08/20/21   [name], NP  Magnesium 500 MG TABS Take 500 mg by mouth daily.    [provider]  meclizine (ANTIVERT) 25 MG tablet Take 1 tablet (25 mg total) by mouth 3 (three) times daily as needed for dizziness. 08/27/21   Deno Etienne, DO  multivitamin [facility]) per tablet Take 1 tablet by mouth daily.    [provider]  nitroGLYCERIN (NITROSTAT) 0.4 MG SL tablet Place 1 tablet (0.4 mg total) under the tongue every 5 (five) minutes as needed for chest pain. 09/16/21   [name], MD  ondansetron (ZOFRAN-ODT) 4 MG disintegrating tablet Take 1 tablet (4 mg total) by mouth every 8 (eight) hours as needed for nausea or vomiting. 11/17/21   Quintella Reichert, MD  polyethylene glycol (MIRALAX / GLYCOLAX) 17 g packet Take 17-34 g by mouth daily as needed for constipation.    [provider]  rosuvastatin (CRESTOR) 5 MG tablet TAKE 1  TABLET EVERY DAY 10/21/21   [name], NP  sodium chloride (OCEAN) 0.65 % nasal spray Place 1 spray into both nostrils at bedtime as needed for congestion.    [provider]  traMADol (ULTRAM) 50 MG tablet Take 1 tablet (50 mg total) by mouth every 12 (twelve) hours as needed. 01/16/22   [name], NP    Current Facility-Administered Medications  Medication Dose Route Frequency Provider Last Rate Last Admin   0.9 %  sodium chloride infusion   Intravenous Continuous Palumbo, April, MD 125 mL/hr at 04/21/22 0205 New Bag at 04/21/22 0205   Oral care mouth rinse  15 mL Mouth Rinse PRN Lequita Halt, MD        Allergies as of 04/20/2022 - Review Complete 04/20/2022  Allergen Reaction Noted   Pravastatin Other (See Comments) 05/06/2018   Sildenafil Other (See Comments) 09/21/2017   Oxycodone  12/29/2020   Atenolol Other (See Comments) 05/26/2009   Elastic bandages & [zinc] Other (See Comments) 03/04/2020   Metoclopramide hcl Anxiety 06/12/2011    Social History   Socioeconomic History   Marital status: Married    Spouse name: Not on file   Number of children: 1  Years of education: Not on file   Highest education level: Not on file  Occupational History   Occupation: firefighter    Comment: paints on the side  Tobacco Use   Smoking status: Former    Years: 11.00    Types: Cigarettes    Quit date: 09/11/1978    Years since quitting: 43.6   Smokeless tobacco: Never  Vaping Use   Vaping Use: Never used  Substance and Sexual Activity   Alcohol use: Not Currently    Alcohol/week: 0.0 standard drinks of alcohol    Comment: rare   Drug use: No   Sexual activity: Not Currently  Other Topics Concern   Not on file  Social History Narrative   Lives with his wife   He has one son- lives locally.   2 grandchildren   Has worked for Research officer, trade union   Enjoys wood working   Completed HS.  [employer]   Social Determinants of Health   Financial Resource  Strain: Low Risk  (10/28/2021)   Overall Financial Resource Strain (CARDIA)    Difficulty of Paying Living Expenses: Not very hard  Food Insecurity: No Food Insecurity (10/28/2021)   Hunger Vital Sign    Worried About Running Out of Food in the Last Year: Never true    Ran Out of Food in the Last Year: Never true  Transportation Needs: No Transportation Needs (08/01/2021)   PRAPARE - Hydrologist (Medical): No    Lack of Transportation (Non-Medical): No  Physical Activity: Insufficiently Active (10/28/2021)   Exercise Vital Sign    Days of Exercise per Week: 2 days    Minutes of Exercise per Session: 30 min  Stress: No Stress Concern Present (01/19/2022)   [place]    Feeling of Stress : Only a little  Social Connections: Moderately Isolated (01/19/2022)   Social Connection and Isolation Panel [NHANES]    Frequency of Communication with Friends and Family: More than three times a week    Frequency of Social Gatherings with Friends and Family: More than three times a week    Attends Religious Services: Never    Marine scientist or Organizations: No    Attends Archivist Meetings: Never    Marital Status: Married  Human resources officer Violence: Not At Risk (01/19/2022)   Humiliation, Afraid, Rape, and Kick questionnaire    Fear of Current or Ex-Partner: No    Emotionally Abused: No    Physically Abused: No    Sexually Abused: No    Review of Systems: All systems reviewed and negative except where noted in HPI.  Physical Exam: Vital signs in last 24 hours: Temp:  [97.6 F (36.4 C)-98 F (36.7 C)] 97.7 F (36.5 C) (08/11 1354) Pulse Rate:  [51-66] 56 (08/11 1100) Resp:  [11-18] 13 (08/11 1400) BP: (126-168)/(69-84) 139/71 (08/11 1400) SpO2:  [94 %-100 %] 99 % (08/11 1100) Weight:  [64.4 kg-66.4 kg] 66.4 kg (08/11 1354)    General:  Alert thin male in NAD Psych:   Pleasant, cooperative. Normal mood and affect Eyes: Pupils equal, no icterus. Conjunctive pink Ears:  Normal auditory acuity Nose: No deformity, discharge or lesions Neck:  Supple, no masses felt Lungs:  Clear to auscultation.  Heart:  Regular rate, regular rhythm. No lower extremity edema Abdomen:  Soft, nondistended, nontender, active bowel sounds, no masses felt Rectal :  Scant amount of medium red colored stool in  vault Msk: Symmetrical without gross deformities.  Neurologic:  Alert, oriented, grossly normal neurologically Skin:  Intact without significant lesions.    Intake/Output from previous day: No intake/output data recorded. Intake/Output this shift:  No intake/output data recorded.   Principal Problem:   GI bleed    Tye Savoy, NP-C @  04/21/2022, 3:28 PM

## 2022-04-21 NOTE — Plan of Care (Signed)
Originating Facility: [facility] Requesting Physician/APP: Palumbo  History: [name] Pratt is a 81 year old male who presented to [facility] with rectal bleeding.  EDP reports blood clots in rectal vault with FOBT positive.  Hemoglobin 12.1 which is similar to most recent results in EMR of 12.1 on 11/17/2020.  Has previously seen digestive health specialists in [place].  Hemodynamically stable.  Not on anticoagulation.  ED physician sent secure message to GI, Dr. Benson Norway for consultation  Plan of Care:  -- Observation, telemetry   [facility] will assume care on arrival to accepting facility. Until arrival, care as per EDP. However, [facility] available 24/7 for questions and assistance.   Please page [place] and Consults [phone]) as soon as the patient arrives to the hospital.   [name] Pratt [place] DO

## 2022-04-21 NOTE — ED Notes (Signed)
ER dr aware of pts Hgb being 10  after 2 BRB stools, [facility] called for bed

## 2022-04-21 NOTE — ED Notes (Signed)
Pt up to Br with assist  of nurse and cane   had bright red blood in commode  ,settled  back into bed

## 2022-04-21 NOTE — ED Notes (Signed)
[facility] here to get pt 

## 2022-04-21 NOTE — Progress Notes (Signed)
Patient needed to go to bathroom. He had a large bright red BM with many large clots and was then light-headed. Within a few minutes, prior to ever leaving the bathroom, he had another BM.   Writer was standing outside of door when patient pushed it open saying he was unable to hold himself up. Went inside to hold patient up and he said he was breaking out in a sweat an felt really weak like he was passing out.   Staff was alerted and rapid was called. Patient then began to vomit and again said he felt very weak. He then sat up straight on the toilet and he passed out landing on the back of the toilet.  He did come to after about 2 minutes.   Rapid at bedside. MD notified. GI was outside room when this happened, so were present and witnessed event as well as amount of gross red blood and clots in toilet.  Patient was helped back to bed. HR was in the 50s, patient pale, responding only minimally to staff. Blood pressure trending down and at lowest were in the 71-06Y systolic, map dropping below 60. Upon patients arrive BP were consistently about 694W systolic.   Patient's heart rate did continue to become more brady and at one point,, monitor began to alarm and patient's HR went into the 20s. At this time, patient was unresponsive and choking on secretions, possible vomit.  Transfer to ICU is planned. Continue to monitor.

## 2022-04-21 NOTE — Progress Notes (Signed)
[facility]Link Physician-Brief Progress Note Patient Name: [name] Pratt DOB: 16-Aug-1941 MRN: [id number]   Date of Service  04/21/2022  HPI/Events of Note  Patient admitted with rectal bleeding and acute blood loss anemia, he was transferred to the ICU  for a syncopal event preceded by marked bradycardia. He remains nauseous despite Zofran, and Qtc is 526.  eICU Interventions  New Patient Evaluation. Scopolamine patch ordered for nausea.        Frederik Pear 04/21/2022, 10:43 PM

## 2022-04-21 NOTE — Consult Note (Signed)
NAME:  MARKEEM NOREEN, MRN:  [id number], DOB:  Jan 19, 1941, LOS: 0 ADMISSION DATE:  04/21/2022, CONSULTATION DATE: 04/21/2022 REFERRING MD: Dr. Roosevelt Locks, CHIEF COMPLAINT: Shock, hematochezia  History of Present Illness:  81 year old man with an extensive past medical history that includes diverticular disease and hemorrhoids with chronic constipation.  Also hyperlipidemia, hypertension, hypothyroidism, COPD, peripheral neuropathy.  He was admitted 8/11 with rectal bleeding.  He began to see hematochezia and melena 8/10, has continued to have red bowel movements.  He had a drop in hemoglobin from 12 > 10 > 8.8. Afternoon 04/21/2022 he had a near syncopal episode when he was in the bathroom, had 2 large bloody bowel movements.  He was assisted back to bed and had acute bradycardia, associated hypotension and loss of consciousness.  Atropine given x1 with restoration heart rate to 50s, blood pressure to 90s.  IV fluid resuscitation initiated and PCCM called to further evaluate. Gastroenterology has seen the patient and suspect diverticular bleeding given his history.  Have ordered CT angiography of the abdomen.  Pertinent  Medical History   Past Medical History:  Diagnosis Date   Allergic rhinitis 02/02/2016   Anemia 12/17/2009   Formatting of this note might be different from the original. Anemia  10/1 IMO update   Aortic atherosclerosis ([place]) 03/05/2020   Arthritis    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   Atypical chest pain 11/24/2015   Benign essential hypertension 05/13/2009   Qualifier: Diagnosis of  By: Larwance Sachs of this note might be different from the original. Hypertension   Benign paroxysmal positional vertigo 09/14/2021   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[place]) 07/22/2021   Body mass index (BMI) 25.0-25.9, adult 11/02/2020   Cancer ([place])    skin cancer   Cardiac murmur 07/25/2021   Carotid stenosis    a. Carotid U/S 4/09: LICA < 81%, RICA 19-14%;  b.   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   Cystoid macular edema of both eyes 07/28/2020   Degenerative disc disease, lumbar 06/30/2015   Deviated nasal septum 02/18/2015   Diverticulosis 03/03/2020   DJD (degenerative joint disease) of hip 12/24/2012   Dyspnea 06/12/2011   Emphysema, unspecified ([place]) 03/05/2020   Erectile dysfunction 06/20/2017   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[place]) 02/09/2021   Gait disorder 03/02/2021   GERD (gastroesophageal reflux disease)    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   Qualifier: Diagnosis of  By: Burnett Kanaris     Greater trochanteric pain syndrome 07/22/2021   Hand pain, left 01/16/2022   Hearing loss 02/02/2016   Heme positive stool 02/29/2020   HIATAL HERNIA 05/13/2009   Qualifier: Diagnosis of  By: Burnett Kanaris     High risk medication use 01/18/2012   Hip pain 03/02/2021   History of chicken pox    History of COVID-19 12/05/2021   History of hemorrhoids    History of kidney stones    History of skin cancer 07/22/2021   skin cancer   History of total bilateral knee replacement 07/08/2020   Hyperglycemia 03/03/2014   Hyperlipidemia with target LDL less than 130 12/27/2010  Formatting of this note might be different from the original. 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 Osteoarthritis Of The Hand  10/1 IMO update   Osteoarthrosis, unspecified whether generalized or localized, pelvic region and thigh 07/25/2010   Formatting of this note might be different from the original. Osteoarthritis Of The Hip   Other abnormal glucose 07/22/2021   Other allergic rhinitis 02/18/2015   Palpitations 07/25/2021   Peripheral neuropathy 06/19/2016   Preventative health care 11/24/2015   Right groin pain 11/29/2012   Right inguinal hernia 05/13/2009   Qualifier: Diagnosis of  By: Burnett Kanaris     RLS (restless legs syndrome) 02/18/2015   Rosacea    Rotator cuff tear 07/27/2013   Situational depression 03/02/2021   Skin lesion 01/16/2022   Sleep apnea with use of continuous positive airway pressure (CPAP) 07/15/2013   CPAP set to  7 cm water,  Residual AHi was 7.8  With no leak.  User time 6 hours.  479 days , not used only 12 days - highly compliant 06-23-13  .    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   Urinary retention 11/26/2020   Ventral hernia 07/08/2012   Vertigo 07/08/2021   Weight loss 03/03/2020     Significant Hospital Events: Including procedures, antibiotic start and stop dates in addition to other pertinent events     Interim History / Subjective:  Remains somewhat lethargic but will answer  questions.  States that he feels very weak.  Denies any pain  Objective   Blood pressure 125/60, pulse (!) 56, temperature 97.7 F (36.5 C), temperature source Oral, resp. rate 16, height '5\' 6"'$  (1.676 m), weight 66.4 kg, SpO2 95 %.       No intake or output data in the 24 hours ending 04/21/22 1716 Filed Weights   04/20/22 2313 04/21/22 1354  Weight: 64.4 kg 66.4 kg    Examination: General: Pale elderly man, laying in bed HENT: Oropharynx clear, no lesions Lungs: Distant, clear bilaterally Cardiovascular: Bradycardic 50s, regular, no murmur Abdomen: No tenderness, positive bowel sounds Extremities: No edema Neuro: Patient is sleepy, somewhat lethargic, wakes to voice and will open eyes and interact.  He answers questions.  Moves all extremities and follows commands.  He is globally weak. GU: Deferred  Resolved Hospital Problem list     Assessment & Plan:  Hemorrhagic shock, associated syncope Acute blood loss anemia due to GI bleeding, apparently lower GI blood loss.  Suspect diverticular bleeding -Stat CBC type and screen now, crossmatch for 2 units PRBC -Transfuse goal hemoglobin 8.0 in  the setting of active bleeding -IV fluid resuscitation -Atropine at bedside -Initiate norepinephrine if blood pressure does not respond to volume resuscitation, PRBC.  Initially via peripheral IV -Appreciate GI input, have recommended CT angiography of the abdomen.  He may be a candidate for IR embolization if a bleeding diverticular source is found -Other diagnostic procedures as per GI plans when we have the CT results -PPI ordered every 12 hours  Acute metabolic encephalopathy.  Likely due to hypoperfusion state, anemia -Supportive care -Watch closely for airway protection, does not need to be intubated at this time.  Dr. Roosevelt Locks confirmed with his wife that he is full code, would want full support  History of hypertension -Hold all antihypertensive meds at this time, reinitiate when safe  to do so  Hypothyroidism -Continue Synthroid.  May need to change to IV formulation if he needs to be n.p.o. for GI procedure  Hypercholesterolemia -Continue rosuvastatin if he is taking p.o.  History of COPD -Albuterol as needed.  No maintenance inhaled medication at this time  Gout -Continue allopurinol when he can take p.o.  Peripheral neuropathy -Continue home gabapentin when he can take p.o.   Best Practice (right click and "Reselect all SmartList Selections" daily)   Diet/type: NPO DVT prophylaxis: SCD GI prophylaxis: PPI Lines: N/A Foley:  N/A Code Status:  full code Last date of multidisciplinary goals of care discussion [goals of care discussed with the patient's wife by Dr. Roosevelt Locks on 04/21/2022, confirmed full CODE STATUS]  Labs   CBC: Recent Labs  Lab 04/21/22 0043 04/21/22 0950 04/21/22 1630  WBC 8.6 8.2  --   NEUTROABS 5.4 5.5  --   HGB 12.1* 10.0* 8.8*  HCT 36.0* 29.8* 27.1*  MCV 97.3 98.7  --   PLT 338 301  --     Basic Metabolic Panel: Recent Labs  Lab 04/21/22 0043  NA 139  K 3.9  CL 105  CO2 27  GLUCOSE 110*  BUN 35*  CREATININE 1.20  CALCIUM 8.8*   GFR: Estimated Creatinine Clearance: 42.8 mL/min (by C-G formula based on SCr of 1.2 mg/dL). Recent Labs  Lab 04/21/22 0043 04/21/22 0950  WBC 8.6 8.2    Liver Function Tests: Recent Labs  Lab 04/21/22 0043  AST 34  ALT 29  ALKPHOS 70  BILITOT 0.5  PROT 7.5  ALBUMIN 3.8   No results for input(s): "LIPASE", "AMYLASE" in the last 168 hours. No results for input(s): "AMMONIA" in the last 168 hours.  ABG No results found for: "PHART", "PCO2ART", "PO2ART", "HCO3", "TCO2", "ACIDBASEDEF", "O2SAT"   Coagulation Profile: Recent Labs  Lab 04/21/22 0043  INR 1.0    Cardiac Enzymes: No results for input(s): "CKTOTAL", "CKMB", "CKMBINDEX", "TROPONINI" in the last 168 hours.  HbA1C: Hgb A1c MFr Bld  Date/Time Value Ref Range Status  03/07/2022 11:38 AM 5.5 4.6 - 6.5 % Final     Comment:    Glycemic Control Guidelines for People with Diabetes:Non Diabetic:  <6%Goal of Therapy: <7%Additional Action Suggested:  >8%   12/29/2020 11:08 AM 5.7 4.6 - 6.5 % Final    Comment:    Glycemic Control Guidelines for People with Diabetes:Non Diabetic:  <6%Goal of Therapy: <7%Additional Action Suggested:  >8%     CBG: No results for input(s): "GLUCAP" in the last 168 hours.  Review of Systems:   Patient feels weak and lightheaded  Past Medical History:  He,  has a past medical history of Allergic rhinitis (02/02/2016), Anemia (12/17/2009), Aortic atherosclerosis ([place]) (03/05/2020),  Arthritis, Asymmetric SNHL (sensorineural hearing loss) (02/29/2016), Atypical chest pain (11/24/2015), Benign essential hypertension (05/13/2009), Benign paroxysmal positional vertigo (09/14/2021), 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[place]) (07/22/2021), Body mass index (BMI) 25.0-25.9, adult (11/02/2020), Cancer ([place]), Cardiac murmur (07/25/2021), Carotid stenosis, Carpal tunnel syndrome (01/24/2022), Cervical myelopathy ([place]) (11/24/2020), Complex sleep apnea syndrome (37/62/8315), Complication of anesthesia, COPD (chronic obstructive pulmonary disease) ([place]), CPAP (continuous positive airway pressure) dependence (03/25/2018), Cranial nerve IV palsy (02/02/2016), Cystoid macular edema of both eyes (07/28/2020), Degenerative disc disease, lumbar (06/30/2015), Deviated nasal septum (02/18/2015), Diverticulosis (03/03/2020), DJD (degenerative joint disease) of hip (12/24/2012), Dyspnea (06/12/2011), Emphysema, unspecified ([place]) (03/05/2020), Erectile dysfunction (06/20/2017), 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[place]) (02/09/2021), Gait disorder (03/02/2021), GERD (gastroesophageal reflux disease), 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), Greater trochanteric pain syndrome (07/22/2021), Hand pain, left (01/16/2022), Hearing loss (02/02/2016), Heme positive stool (02/29/2020), HIATAL HERNIA  (05/13/2009), High risk medication use (01/18/2012), Hip pain (03/02/2021), History of chicken pox, History of COVID-19 (12/05/2021), History of hemorrhoids, History of kidney stones, History of skin cancer (07/22/2021), History of total bilateral knee replacement (07/08/2020), Hyperglycemia (03/03/2014), Hyperlipidemia with target LDL less than 130 (12/27/2010), Hypertension, Hypokalemia (07/08/2021), Hypothyroidism, Internal hemorrhoids (03/03/2020), Leg cramps (11/13/2019), Lower GI bleed (12/12/2012), Lumbago of lumbar region with sciatica (10/21/2020), Lumbar radiculopathy (03/02/2021), Lumbar spondylosis (11/02/2020), NEPHROLITHIASIS (05/13/2009), Nonspecific abnormal electrocardiogram (ECG) (EKG) (01/06/2013), Numbness of hand (01/24/2022), Onychomycosis (06/30/2015), Osteoarthrosis, hand (06/13/2010), Osteoarthrosis, unspecified whether generalized or localized, pelvic region and thigh (07/25/2010), Other abnormal glucose (07/22/2021), Other allergic rhinitis (02/18/2015), Palpitations (07/25/2021), Peripheral neuropathy (06/19/2016), Preventative health care (11/24/2015), Right groin pain (11/29/2012), Right inguinal hernia (05/13/2009), RLS (restless legs syndrome) (02/18/2015), Rosacea, Rotator cuff tear (07/27/2013), Situational depression (03/02/2021), Skin lesion (01/16/2022), Sleep apnea with use of continuous positive airway pressure (CPAP) (07/15/2013), Spondylitis ([place]) (07/22/2021), Status post cervical spinal fusion (07/09/2020), 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   Surgical History:   Past Surgical History:  Procedure Laterality Date   ABDOMINAL HERNIA  REPAIR  07/15/12   Dr [name]   ANTERIOR CERVICAL DECOMP/DISCECTOMY FUSION N/A 07/09/2020   Procedure: ANTERIOR CERVICAL DECOMPRESSION AND FUSION CERVICAL THREE-FOUR.;  Surgeon: [name], MD;  Location: [place];  Service: Neurosurgery;  Laterality: N/A;  anterior   BROW LIFT  05/07/01   CYSTOSCOPY WITH URETEROSCOPY AND STENT PLACEMENT Right 01/28/2014   Procedure: CYSTOSCOPY WITH URETEROSCOPY, BASKET RETRIVAL AND  STENT PLACEMENT;  Surgeon: [name], MD;  Location: [facility];  Service: Urology;  Laterality: Right;   epidural injections     multiple procedures   ESOPHAGEAL DILATION  1993 and 1994   multiple times   EYE SURGERY Bilateral 03/25/10, 2012   cataract removal   HEMORRHOID SURGERY  2014   HIATAL HERNIA REPAIR  12/21/93   HOLMIUM LASER APPLICATION Right 1/76/1607   Procedure: HOLMIUM LASER APPLICATION;  Surgeon: [name], MD;  Location: [facility];  Service: Urology;  Laterality: Right;   INGUINAL HERNIA REPAIR Right 07/15/12   Dr [name], x2   JOINT REPLACEMENT  07/25/04   right knee   JOINT REPLACEMENT  07/13/10   left knee   [place]  09/20/06   with intraoperative cholangiogram and right inguinal herniorrhaphy with mesh    LIGAMENT REPAIR Left 07/2013   shoulder   LITHOTRIPSY  x2   NASAL SEPTUM SURGERY  1975   POSTERIOR CERVICAL FUSION/FORAMINOTOMY N/A 11/24/2020   Procedure: Posterior Cervical Laminectomy Cervical three-four, Cervical four-five with lateral mass fusion;  Surgeon: [name], MD;  Location: [place];  Service: Neurosurgery;  Laterality: N/A;   REFRACTIVE SURGERY Bilateral    Right total hip replacement  4/14   SKIN CANCER DESTRUCTION     nose, ear   TONSILLECTOMY  as child   TOTAL HIP ARTHROPLASTY Left    URETHRAL DILATION  1991   [name]     Social History:   reports that he quit smoking about 43 years ago. His smoking use included cigarettes. He has never used smokeless tobacco. He reports that he does not  currently use alcohol. He reports that he does not use drugs.   Family History:  His family history includes Cancer in his mother and another family member; Hypertension in an other family member; Osteoarthritis in an other family member.   Allergies Allergies  Allergen Reactions   Pravastatin Other (See Comments)    Muscle cramps   Sildenafil Other (See Comments)    Tachycardia     Metoclopramide    Oxycodone     hallucinations   Atenolol Other (See Comments)    bradycardia   Elastic Bandages & [Zinc] Other (See Comments)    Teflon bandages - Irritation   Metoclopramide Hcl Anxiety     Home Medications  Prior to Admission medications   Medication Sig Start Date End Date Taking? Authorizing Provider  acetaminophen (TYLENOL) 650 MG CR tablet Take 1,300 mg by mouth every morning. And take 650 mg at bedtime    [provider]  albuterol (VENTOLIN HFA) 108 (90 Base) MCG/ACT inhaler INHALE 2 PUFFS INTO THE LUNGS EVERY 6 HOURS AS NEEDED FOR SHORTNESS OF BREATH. 10/20/21   [name], NP  Alcohol Swabs (ALCOHOL PADS) 70 % PADS 1 packet by Does not apply route 2 (two) times daily. 11/22/20   [name], NP  allopurinol (ZYLOPRIM) 100 MG tablet TAKE 1 TABLET TWICE DAILY 04/17/22   [name], NP  amLODipine (NORVASC) 5 MG tablet Take 1 tablet (5 mg total) by mouth daily. Patient not taking: Reported on 04/20/2022 01/26/22   Revankar, Reita Cliche, MD  ampicillin (PRINCIPEN) 500 MG capsule Take 1 capsule (500 mg total) by mouth 4 (four) times daily. 12/08/21   [name], NP  aspirin EC 81 MG tablet Take 81 mg by mouth every morning.    [provider]  Cholecalciferol (VITAMIN D) 50 MCG (2000 UT) CAPS Take 2,000 Units by mouth daily.    [provider]  colchicine 0.6 MG tablet Take 0.6 mg by mouth as needed for other. For gout    [provider]  cycloSPORINE (RESTASIS) 0.05 % ophthalmic emulsion Place 1 drop into both eyes 2  (two) times daily.    [provider]  diclofenac Sodium (VOLTAREN) 1 % GEL Apply 1 application topically daily as needed for pain (Hand pain).      fluocinonide (LIDEX) 0.05 % external solution Apply 1 application topically 2 (two) times daily as needed for itching or pain. 06/13/21   [provider]  gabapentin (NEURONTIN) 300 MG capsule TAKE 1 CAPSULE TWO TO THREE TIMES DAILY AS NEEDED 11/25/21   [name], NP  GEMTESA 75 MG TABS Take 1 tablet by mouth daily. 10/20/21   [provider]  glucose blood test strip TRUE Metrix Blood Glucose Test Strip, use as instructed  11/12/20   [name], NP  Homeopathic Products ([place]) FOAM Apply 1 application topically 2 (two) times daily as needed (Pain).    [provider]  hydrALAZINE (APRESOLINE) 50 MG tablet Take 1 tablet (50 mg total) by mouth 2 (two) times daily. 01/17/22   [name], NP  Lancets MISC 1 each by Does not apply route 2 (two) times daily. TRUE matrix lancets 11/12/20   [name], NP  levothyroxine (SYNTHROID) 150 MCG tablet Take 150 mcg 6 days a week (patient takes 75 mcg once a week) Disp #84 for 90 day supply 12/01/21   [name], NP  levothyroxine (SYNTHROID) 75 MCG tablet Take 75 mcg once a week (patient on 150 mcg 6 days a week) Dispense #24 for 90 day supply. 12/01/21   [name], NP  losartan (COZAAR) 100 MG tablet TAKE 1 TABLET EVERY DAY 08/20/21   [name], NP  Magnesium 500 MG TABS Take 500 mg by mouth daily.    [provider]  meclizine (ANTIVERT) 25 MG tablet Take 1 tablet (25 mg total) by mouth 3 (three) times daily as needed for dizziness. 08/27/21   Deno Etienne, DO  multivitamin [facility]) per tablet Take 1 tablet by mouth daily.    [provider]  nitroGLYCERIN (NITROSTAT) 0.4 MG SL tablet Place 1 tablet (0.4 mg total) under the tongue every 5 (five) minutes as needed for chest pain. 09/16/21   [name], MD  ondansetron (ZOFRAN-ODT) 4 MG disintegrating tablet Take 1 tablet (4 mg total) by mouth every 8 (eight) hours as needed for nausea or vomiting. 11/17/21   Quintella Reichert, MD  polyethylene glycol (MIRALAX / GLYCOLAX) 17 g packet Take 17-34 g by mouth daily as needed for constipation.    [provider]  rosuvastatin (CRESTOR) 5 MG tablet TAKE 1 TABLET EVERY DAY 10/21/21   [name], NP  sodium chloride (OCEAN) 0.65 % nasal spray Place 1 spray into both nostrils at bedtime as needed for congestion.    [provider]  traMADol (ULTRAM) 50 MG tablet Take 1 tablet (50 mg total) by mouth every 12 (twelve) hours as needed. 01/16/22   [name], NP     Critical care time: 70 minutes     Baltazar Apo, MD, PhD 04/21/2022, 5:30 PM [facility] [phone] or if no answer before 7:00PM call [phone] For any issues after 7:00PM please call eLink [phone]

## 2022-04-21 NOTE — Significant Event (Addendum)
Rapid Response Event Note   Reason for Call :  GI bleed, syncopal episode in bathroom  Initial Focused Assessment:  Pt up to bathroom when he passed a large amount of blood and briefly passed out. He was not on monitoring at time of event. He was assisted back to bed by three staff members. He was nauseous and had emesis as well.   Pt was lethargic once back to bed, but remained responsive and communicating with staff.  VS: BP 125/60, HR 59, RR 16, SpO2 95% on room air  Shortly thereafter, he became bradycardic to the 20s. He was apneic with a pulse. Pt orally suctioned and a moderate amount of thick secretions was removed. Pt HR returned to the 40s. Atropine 0.'4mg'$  IV given and HR returned to the 50s. Pt hypotensive at 83/46, 1L IVF bolus started. Pt continued to have periods of apnea and oxygen saturation was recovered with 100% NRB.   VS: BP 106/52, HR 64, RR 24, SpO2 82% on 100% NRB  Interventions:  -18' IV initiated -Type and screen collected and sent- CBC collected and sent -100% NRB -1L IVF bolus -PRN for nausea/vomiting given -CTA completed  Plan of Care:  Transfer to ICU  Event Summary:  MD Notified: Dr. Sherrye Payor Dr. Lamonte Sakai Call Time: [id number] Arrival Time: 1620 End Time: [place], RN

## 2022-04-21 NOTE — ED Provider Notes (Signed)
[facility]ERGENCY DEPARTMENT Provider Note   CSN: [id number] Arrival date & time: 04/20/22  2258     History  Chief Complaint  Patient presents with   Rectal Bleeding    [name] Pratt is a 81 y.o. male.  The history is provided by the patient.  Rectal Bleeding Quality:  Bright red Amount:  Moderate Timing:  Constant Chronicity:  New Context: defecation   Relieved by:  Nothing Worsened by:  Nothing Ineffective treatments:  None tried Associated symptoms: no abdominal pain, no fever and no loss of consciousness   Risk factors: no anticoagulant use        Home Medications Prior to Admission medications   Medication Sig Start Date End Date Taking? Authorizing Provider  acetaminophen (TYLENOL) 650 MG CR tablet Take 1,300 mg by mouth every morning. And take 650 mg at bedtime    [provider]  albuterol (VENTOLIN HFA) 108 (90 Base) MCG/ACT inhaler INHALE 2 PUFFS INTO THE LUNGS EVERY 6 HOURS AS NEEDED FOR SHORTNESS OF BREATH. 10/20/21   [name], NP  Alcohol Swabs (ALCOHOL PADS) 70 % PADS 1 packet by Does not apply route 2 (two) times daily. 11/22/20   [name], NP  allopurinol (ZYLOPRIM) 100 MG tablet TAKE 1 TABLET TWICE DAILY 04/17/22   [name], NP  amLODipine (NORVASC) 5 MG tablet Take 1 tablet (5 mg total) by mouth daily. Patient not taking: Reported on 04/20/2022 01/26/22   Revankar, Reita Cliche, MD  ampicillin (PRINCIPEN) 500 MG capsule Take 1 capsule (500 mg total) by mouth 4 (four) times daily. 12/08/21   [name], NP  aspirin EC 81 MG tablet Take 81 mg by mouth every morning.    [provider]  Cholecalciferol (VITAMIN D) 50 MCG (2000 UT) CAPS Take 2,000 Units by mouth daily.    [provider]  colchicine 0.6 MG tablet Take 0.6 mg by mouth as needed for other. For gout    [provider]  cycloSPORINE (RESTASIS) 0.05 % ophthalmic emulsion Place 1 drop into both eyes 2 (two)  times daily.    [provider]  diclofenac Sodium (VOLTAREN) 1 % GEL Apply 1 application topically daily as needed for pain (Hand pain).      fluocinonide (LIDEX) 0.05 % external solution Apply 1 application topically 2 (two) times daily as needed for itching or pain. 06/13/21   [provider]  gabapentin (NEURONTIN) 300 MG capsule TAKE 1 CAPSULE TWO TO THREE TIMES DAILY AS NEEDED 11/25/21   [name], NP  GEMTESA 75 MG TABS Take 1 tablet by mouth daily. 10/20/21   [provider]  glucose blood test strip TRUE Metrix Blood Glucose Test Strip, use as instructed 11/12/20   [name], NP  Homeopathic Products ([place]) FOAM Apply 1 application topically 2 (two) times daily as needed (Pain).    [provider]  hydrALAZINE (APRESOLINE) 50 MG tablet Take 1 tablet (50 mg total) by mouth 2 (two) times daily. 01/17/22   [name], NP  Lancets MISC 1 each by Does not apply route 2 (two) times daily. TRUE matrix lancets 11/12/20   [name], NP  levothyroxine (SYNTHROID) 150 MCG tablet Take 150 mcg 6 days a week (patient takes 75 mcg once a week) Disp #84 for 90 day supply 12/01/21   [name], NP  levothyroxine (SYNTHROID) 75 MCG tablet Take 75 mcg once a week (patient on 150 mcg 6 days a week) Dispense #24 for 90 day  supply. 12/01/21   [name], NP  losartan (COZAAR) 100 MG tablet TAKE 1 TABLET EVERY DAY 08/20/21   [name], NP  Magnesium 500 MG TABS Take 500 mg by mouth daily.    [provider]  meclizine (ANTIVERT) 25 MG tablet Take 1 tablet (25 mg total) by mouth 3 (three) times daily as needed for dizziness. 08/27/21   Deno Etienne, DO  multivitamin [employer]) per tablet Take 1 tablet by mouth daily.    [provider]  nitroGLYCERIN (NITROSTAT) 0.4 MG SL tablet Place 1 tablet (0.4 mg total) under the tongue every 5 (five) minutes as needed for chest pain. 09/16/21   [name], MD  ondansetron (ZOFRAN-ODT) 4 MG disintegrating tablet Take 1 tablet (4 mg total) by mouth every 8 (eight) hours as needed for nausea or vomiting. 11/17/21   Quintella Reichert, MD  polyethylene glycol (MIRALAX / GLYCOLAX) 17 g packet Take 17-34 g by mouth daily as needed for constipation.    [provider]  rosuvastatin (CRESTOR) 5 MG tablet TAKE 1 TABLET EVERY DAY 10/21/21   [name], NP  sodium chloride (OCEAN) 0.65 % nasal spray Place 1 spray into both nostrils at bedtime as needed for congestion.    [provider]  traMADol (ULTRAM) 50 MG tablet Take 1 tablet (50 mg total) by mouth every 12 (twelve) hours as needed. 01/16/22   [name], NP      Allergies    Pravastatin, Sildenafil, Oxycodone, Atenolol, Elastic bandages & [zinc], and Metoclopramide hcl    Review of Systems   Review of Systems  Constitutional:  Negative for fever.  Eyes:  Negative for redness.  Respiratory:  Negative for wheezing and stridor.   Gastrointestinal:  Positive for hematochezia. Negative for abdominal pain.  Neurological:  Negative for loss of consciousness.  All other systems reviewed and are negative.   Physical Exam Updated Vital Signs BP (!) 141/81 (BP Location: Right Arm)   Pulse 66   Temp 98 F (36.7 C)   Resp 16   Ht _0  (1.676 m)   Wt 64.4 kg   SpO2 98%   BMI 22.92 kg/m  Physical Exam Vitals and nursing note reviewed. Exam conducted with a chaperone present.  Constitutional:      General: He is not in acute distress.    Appearance: He is well-developed. He is not diaphoretic.  HENT:     Head: Normocephalic and atraumatic.     Nose: Nose normal.  Eyes:     Conjunctiva/sclera: Conjunctivae normal.     Pupils: Pupils are equal, round, and reactive to light.  Cardiovascular:     Rate and Rhythm: Normal rate and regular rhythm.     Pulses: Normal pulses.     Heart sounds: Normal heart sounds.  Pulmonary:     Effort: Pulmonary effort is normal.      Breath sounds: Normal breath sounds. No wheezing or rales.  Abdominal:     General: Bowel sounds are normal.     Palpations: Abdomen is soft.     Tenderness: There is no abdominal tenderness. There is no guarding or rebound.  Genitourinary:    Rectum: Guaiac result positive.  Musculoskeletal:        General: Normal range of motion.     Cervical back: Normal range of motion and neck supple.  Skin:    General: Skin is warm and dry.     Capillary Refill: Capillary refill takes less than 2 seconds.  Neurological:     General: No focal deficit present.     Mental Status: He is alert and oriented to person, place, and time.  Psychiatric:        Mood and Affect: Mood normal.        Behavior: Behavior normal.     ED Results / Procedures / Treatments   Labs (all labs ordered are listed, but only abnormal results are displayed) Results for orders placed or performed during the hospital encounter of 04/21/22  CBC with Differential  Result Value Ref Range   WBC 8.6 4.0 - 10.5 K/uL   RBC 3.70 (L) 4.22 - 5.81 MIL/uL   Hemoglobin 12.1 (L) 13.0 - 17.0 g/dL   HCT 36.0 (L) 39.0 - 52.0 %   MCV 97.3 80.0 - 100.0 fL   MCH 32.7 26.0 - 34.0 pg   MCHC 33.6 30.0 - 36.0 g/dL   RDW 13.2 11.5 - 15.5 %   Platelets 338 150 - 400 K/uL   nRBC 0.0 0.0 - 0.2 %   Neutrophils Relative % 63 %   Neutro Abs 5.4 1.7 - 7.7 K/uL   Lymphocytes Relative 26 %   Lymphs Abs 2.3 0.7 - 4.0 K/uL   Monocytes Relative 8 %   Monocytes Absolute 0.7 0.1 - 1.0 K/uL   Eosinophils Relative 2 %   Eosinophils Absolute 0.2 0.0 - 0.5 K/uL   Basophils Relative 1 %   Basophils Absolute 0.1 0.0 - 0.1 K/uL   Immature Granulocytes 0 %   Abs Immature Granulocytes 0.03 0.00 - 0.07 K/uL  Comprehensive metabolic panel  Result Value Ref Range   Sodium 139 135 - 145 mmol/L   Potassium 3.9 3.5 - 5.1 mmol/L   Chloride 105 98 - 111 mmol/L   CO2 27 22 - 32 mmol/L   Glucose, Bld 110 (H) 70 - 99 mg/dL   BUN 35 (H) 8 - 23 mg/dL    Creatinine, Ser 1.20 0.61 - 1.24 mg/dL   Calcium 8.8 (L) 8.9 - 10.3 mg/dL   Total Protein 7.5 6.5 - 8.1 g/dL   Albumin 3.8 3.5 - 5.0 g/dL   AST 34 15 - 41 U/L   ALT 29 0 - 44 U/L   Alkaline Phosphatase 70 38 - 126 U/L   Total Bilirubin 0.5 0.3 - 1.2 mg/dL   GFR, Estimated >60 >60 mL/min   Anion gap 7 5 - 15  Protime-INR  Result Value Ref Range   Prothrombin Time 12.6 11.4 - 15.2 seconds   INR 1.0 0.8 - 1.2  Occult blood card to lab, stool Provider will collect  Result Value Ref Range   Fecal Occult Bld POSITIVE (A) NEGATIVE   Intravitreal Injection, Pharmacologic Agent - OD - Right Eye  Result Date: 03/27/2022 Time Out 03/27/2022. 11:03 AM. Confirmed correct patient, procedure, site, and patient consented. Anesthesia Topical anesthesia was used. Anesthetic medications included Lidocaine 4%. Procedure Preparation included 5% betadine to ocular surface, 10% betadine to eyelids, Tobramycin 0.3%. A 30 gauge needle was used. Injection: 2.5 mg bevacizumab 2.5 MG/0.1ML   Route: Intravitreal, Site: Right Eye   NDC: [phone], Lot: [id number], Expiration date: 05/12/2022 Post-op Post injection exam found visual acuity of at least counting fingers, no retinal detachment, perfused optic nerve. The patient tolerated the procedure well. There were no complications. The patient received written and verbal post procedure care education. Post injection medications included ocuflox.   OCT, Retina - OU - Both Eyes  Result Date: 03/27/2022 Right Eye Quality  was good. Scan locations included subfoveal. Central Foveal Thickness: 227. Progression has improved. Findings include abnormal foveal contour, cystoid macular edema. Left Eye Quality was good. Scan locations included subfoveal. Central Foveal Thickness: 222. Progression has been stable. Findings include abnormal foveal contour, cystoid macular edema. 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  Procedures Procedures    Medications Ordered in ED Medications - No data to display  ED Course/ Medical Decision Making/ A&P                           Medical Decision Making 2 episodes of rectal bleeding with defecation this evening.  Saw Dr. Earlean Shawl who retired   Amount and/or Complexity of Data Reviewed External Data Reviewed: notes.    Details: previous notes reviewed Labs: ordered.    Details: all labs reviewed: guiaic is positive.  Clots in rectal vault.  Normal white count.  Hemoglobin low 12.1.  Normal platelets count.  Normal sodium 139, and potassium 3.9.  Normal creatinine.  Normal coags Discussion of management or test interpretation with external provider(s): Secure chat sent to Dr. Benson [place] to consult Case d/w Dr. [place]    Risk Decision regarding hospitalization.    Final Clinical Impression(s) / ED Diagnoses Final diagnoses:  Rectal bleeding   The patient appears reasonably stabilized for admission considering the current resources, flow, and capabilities available in the ED at this time, and I doubt any other [facility] requiring further screening and/or treatment in the ED prior to admission.  Rx / DC Orders ED Discharge Orders     None         Rosario Duey, MD 04/21/22 2585

## 2022-04-21 NOTE — Progress Notes (Addendum)
CTA showed RLL infiltrates likely early aspiration PNA developed during this afternoon's syncope episode when patient vomited and became hypoxic since then.   Start Unasyn.  CTA result reviewed, no active GI bleeding found.

## 2022-04-21 NOTE — Plan of Care (Signed)

## 2022-04-21 NOTE — H&P (Signed)
History and Physical    [name] Pratt WCH:[id number] DOB: June 26, 1941 DOA: 04/21/2022  PCP: [name], NP (Confirm with patient/family/NH records and if not entered, this has to be entered at [facility] point of entry) Patient coming from: Home  I have personally briefly reviewed patient's old medical records in [place]  Chief Complaint: Rectal bleed  HPI: [name] Pratt is a 81 y.o. male with medical history significant of hemorrhoids, chronic constipation, HTN, HLD, hypothyroidism, COPD, peripheral neuropathy, presented with rectal bleed.  Patient has had worsening of constipation, not tolerant to most of the OTC stool softener/laxative.  Yesterday evening, patient started to pass 2 episodes of large amount of dark-colored bloody diarrhea no abdominal pain.  No chest pain or lightheadedness or shortness of breath.  Came to ED and had another episode of dark-colored stool mixed with blood clot again no abdominal pain no lightheadedness.  Overnight patient transferred from [facility] to [facility] and he had a another episode of small dark-colored stool.  He used to have uncontrolled hemorrhoids but he described the dark-colored stool is quite different from bright red blood from hemorrhoid bleeding.  ED Course: No tachycardia no hypotension.  Hemoglobin 12> 10.  Review of Systems: As per HPI otherwise 14 point review of systems negative.    Past Medical History:  Diagnosis Date   Allergic rhinitis 02/02/2016   Anemia 12/17/2009   Formatting of this note might be different from the original. Anemia  10/1 IMO update   Aortic atherosclerosis ([place]) 03/05/2020   Arthritis    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   Atypical chest pain 11/24/2015   Benign essential hypertension 05/13/2009   Qualifier: Diagnosis of  By: Larwance Sachs of this note might be different from the original. Hypertension   Benign paroxysmal positional vertigo  09/14/2021   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[place]) 07/22/2021   Body mass index (BMI) 25.0-25.9, adult 11/02/2020   Cancer ([place])    skin cancer   Cardiac murmur 07/25/2021   Carotid stenosis    a. Carotid U/S 3/53: LICA < 61%, RICA 44-31%;  b.  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   Cystoid macular edema of both eyes 07/28/2020   Degenerative disc disease, lumbar 06/30/2015   Deviated nasal septum 02/18/2015   Diverticulosis 03/03/2020   DJD (degenerative joint disease) of hip 12/24/2012   Dyspnea 06/12/2011   Emphysema, unspecified ([place]) 03/05/2020   Erectile dysfunction 06/20/2017   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[place]) 02/09/2021   Gait disorder 03/02/2021   GERD (gastroesophageal reflux disease)    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   Qualifier: Diagnosis of  By: Burnett Kanaris     Greater trochanteric pain syndrome 07/22/2021   Hand pain, left 01/16/2022   Hearing loss 02/02/2016   Heme positive stool 02/29/2020   HIATAL HERNIA 05/13/2009   Qualifier: Diagnosis of  By: Burnett Kanaris     High risk medication use 01/18/2012   Hip pain 03/02/2021   History of chicken pox    History of COVID-19 12/05/2021   History of hemorrhoids    History of  kidney stones    History of skin cancer 07/22/2021   skin cancer   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 Cardiology - Dr Frances Nickels at [facility] cardiology  ICD-10 cut over   Hypertension     under control   Hypokalemia 07/08/2021   Hypothyroidism    Internal hemorrhoids 03/03/2020   Leg cramps 11/13/2019   Lower GI bleed 12/12/2012   Lumbago of lumbar region with sciatica 10/21/2020   Lumbar radiculopathy 03/02/2021   Lumbar spondylosis 11/02/2020   NEPHROLITHIASIS 05/13/2009   Qualifier: Diagnosis of  By: Burnett Kanaris     Nonspecific abnormal electrocardiogram (ECG) (EKG) 01/06/2013   Numbness of hand 01/24/2022   Onychomycosis 06/30/2015   Osteoarthrosis, 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 Osteoarthritis Of The Hand  10/1 IMO update   Osteoarthrosis, unspecified whether generalized or localized, pelvic region and thigh 07/25/2010   Formatting of this note might be different from the original. Osteoarthritis Of The Hip   Other abnormal glucose 07/22/2021   Other allergic rhinitis 02/18/2015   Palpitations 07/25/2021   Peripheral neuropathy 06/19/2016   Preventative health care 11/24/2015   Right groin pain 11/29/2012   Right inguinal hernia 05/13/2009   Qualifier: Diagnosis of  By: Burnett Kanaris     RLS (restless legs syndrome) 02/18/2015   Rosacea    Rotator cuff tear 07/27/2013   Situational depression 03/02/2021   Skin lesion 01/16/2022   Sleep apnea with use of continuous positive airway pressure (CPAP) 07/15/2013   CPAP set to  7 cm water,  Residual AHi was 7.8  With no leak.  User time 6 hours.  479 days , not used only 12 days - highly compliant 06-23-13  .    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   Urinary retention 11/26/2020   Ventral hernia 07/08/2012   Vertigo 07/08/2021    Weight loss 03/03/2020    Past Surgical History:  Procedure Laterality Date   ABDOMINAL HERNIA REPAIR  07/15/12   Dr [name]   ANTERIOR CERVICAL DECOMP/DISCECTOMY FUSION N/A 07/09/2020   Procedure: ANTERIOR CERVICAL DECOMPRESSION AND FUSION CERVICAL THREE-FOUR.;  Surgeon: [name], MD;  Location: [place];  Service: Neurosurgery;  Laterality: N/A;  anterior   BROW LIFT  05/07/01   CYSTOSCOPY WITH URETEROSCOPY AND STENT PLACEMENT Right 01/28/2014   Procedure: CYSTOSCOPY WITH URETEROSCOPY, BASKET RETRIVAL AND  STENT PLACEMENT;  Surgeon: [name], MD;  Location: [facility];  Service: Urology;  Laterality: Right;   epidural injections     multiple procedures   ESOPHAGEAL DILATION  1993 and 1994   multiple times   EYE SURGERY Bilateral 03/25/10, 2012   cataract removal   HEMORRHOID SURGERY  2014   HIATAL HERNIA REPAIR  12/21/93   HOLMIUM LASER APPLICATION Right 1/96/2229   Procedure: HOLMIUM LASER APPLICATION;  Surgeon: [name], MD;  Location: [facility];  Service: Urology;  Laterality: Right;   INGUINAL HERNIA REPAIR Right 07/15/12   Dr [name], x2   JOINT  REPLACEMENT  07/25/04   right knee   JOINT REPLACEMENT  07/13/10   left knee   KNEE SURGERY  1995   LAPAROSCOPIC CHOLECYSTECTOMY  09/20/06   with intraoperative cholangiogram and right inguinal herniorrhaphy with mesh    LIGAMENT REPAIR Left 07/2013   shoulder   LITHOTRIPSY     x2   [place] FUSION/FORAMINOTOMY N/A 11/24/2020   Procedure: Posterior Cervical Laminectomy Cervical three-four, Cervical four-five with lateral mass fusion;  Surgeon: [name], MD;  Location: [place];  Service: Neurosurgery;  Laterality: N/A;   REFRACTIVE SURGERY Bilateral    Right total hip replacement  4/14   SKIN CANCER DESTRUCTION     nose, ear   TONSILLECTOMY  as child   TOTAL HIP ARTHROPLASTY Left    URETHRAL DILATION  1991   [name]     reports that he quit smoking about 43 years ago. His smoking use  included cigarettes. He has never used smokeless tobacco. He reports that he does not currently use alcohol. He reports that he does not use drugs.  Allergies  Allergen Reactions   Pravastatin Other (See Comments)    Muscle cramps   Sildenafil Other (See Comments)    Tachycardia     Metoclopramide    Oxycodone     hallucinations   Atenolol Other (See Comments)    bradycardia   Elastic Bandages & [Zinc] Other (See Comments)    Teflon bandages - Irritation   Metoclopramide Hcl Anxiety    Family History  Problem Relation Age of Onset   Cancer Mother    Hypertension Other    Cancer Other    Osteoarthritis Other      Prior to Admission medications   Medication Sig Start Date End Date Taking? Authorizing Provider  acetaminophen (TYLENOL) 650 MG CR tablet Take 1,300 mg by mouth every morning. And take 650 mg at bedtime    [provider]  albuterol (VENTOLIN HFA) 108 (90 Base) MCG/ACT inhaler INHALE 2 PUFFS INTO THE LUNGS EVERY 6 HOURS AS NEEDED FOR SHORTNESS OF BREATH. 10/20/21   [name], NP  Alcohol Swabs (ALCOHOL PADS) 70 % PADS 1 packet by Does not apply route 2 (two) times daily. 11/22/20   [name], NP  allopurinol (ZYLOPRIM) 100 MG tablet TAKE 1 TABLET TWICE DAILY 04/17/22   [name], NP  amLODipine (NORVASC) 5 MG tablet Take 1 tablet (5 mg total) by mouth daily. Patient not taking: Reported on 04/20/2022 01/26/22   Revankar, Reita Cliche, MD  ampicillin (PRINCIPEN) 500 MG capsule Take 1 capsule (500 mg total) by mouth 4 (four) times daily. 12/08/21   [name], NP  aspirin EC 81 MG tablet Take 81 mg by mouth every morning.    [provider]  Cholecalciferol (VITAMIN D) 50 MCG (2000 UT) CAPS Take 2,000 Units by mouth daily.    [provider]  colchicine 0.6 MG tablet Take 0.6 mg by mouth as needed for other. For gout    [provider]  cycloSPORINE (RESTASIS) 0.05 % ophthalmic emulsion Place 1 drop into  both eyes 2 (two) times daily.    [provider]  diclofenac Sodium (VOLTAREN) 1 % GEL Apply 1 application topically daily as needed for pain (Hand pain).      fluocinonide (LIDEX) 0.05 % external solution Apply 1 application topically 2 (two) times daily as needed for itching or pain. 06/13/21   [provider]  gabapentin (NEURONTIN)  300 MG capsule TAKE 1 CAPSULE TWO TO THREE TIMES DAILY AS NEEDED 11/25/21   [name], NP  GEMTESA 75 MG TABS Take 1 tablet by mouth daily. 10/20/21   [provider]  glucose blood test strip TRUE Metrix Blood Glucose Test Strip, use as instructed 11/12/20   [name], NP  Homeopathic Products ([place]) FOAM Apply 1 application topically 2 (two) times daily as needed (Pain).    [provider]  hydrALAZINE (APRESOLINE) 50 MG tablet Take 1 tablet (50 mg total) by mouth 2 (two) times daily. 01/17/22   [name], NP  Lancets MISC 1 each by Does not apply route 2 (two) times daily. TRUE matrix lancets 11/12/20   [name], NP  levothyroxine (SYNTHROID) 150 MCG tablet Take 150 mcg 6 days a week (patient takes 75 mcg once a week) Disp #84 for 90 day supply 12/01/21   [name], NP  levothyroxine (SYNTHROID) 75 MCG tablet Take 75 mcg once a week (patient on 150 mcg 6 days a week) Dispense #24 for 90 day supply. 12/01/21   [name], NP  losartan (COZAAR) 100 MG tablet TAKE 1 TABLET EVERY DAY 08/20/21   [name], NP  Magnesium 500 MG TABS Take 500 mg by mouth daily.    [provider]  meclizine (ANTIVERT) 25 MG tablet Take 1 tablet (25 mg total) by mouth 3 (three) times daily as needed for dizziness. 08/27/21   Deno Etienne, DO  multivitamin [facility]) per tablet Take 1 tablet by mouth daily.    [provider]  nitroGLYCERIN (NITROSTAT) 0.4 MG SL tablet Place 1 tablet (0.4 mg total) under the tongue every 5 (five) minutes as needed for chest pain.  09/16/21   [name], MD  ondansetron (ZOFRAN-ODT) 4 MG disintegrating tablet Take 1 tablet (4 mg total) by mouth every 8 (eight) hours as needed for nausea or vomiting. 11/17/21   Quintella Reichert, MD  polyethylene glycol (MIRALAX / GLYCOLAX) 17 g packet Take 17-34 g by mouth daily as needed for constipation.    [provider]  rosuvastatin (CRESTOR) 5 MG tablet TAKE 1 TABLET EVERY DAY 10/21/21   [name], NP  sodium chloride (OCEAN) 0.65 % nasal spray Place 1 spray into both nostrils at bedtime as needed for congestion.    [provider]  traMADol (ULTRAM) 50 MG tablet Take 1 tablet (50 mg total) by mouth every 12 (twelve) hours as needed. 01/16/22   [name], NP    Physical Exam: Vitals:   04/21/22 1100 04/21/22 1102 04/21/22 1354 04/21/22 1400  BP: 139/69   139/71  Pulse: (!) 56     Resp: 15   13  Temp:  97.8 F (36.6 C) 97.7 F (36.5 C)   TempSrc:  Oral Oral   SpO2: 99%     Weight:   66.4 kg   Height:   '5\' 6"'$  (1.676 m)     Constitutional: NAD, calm, comfortable Vitals:   04/21/22 1100 04/21/22 1102 04/21/22 1354 04/21/22 1400  BP: 139/69   139/71  Pulse: (!) 56     Resp: 15   13  Temp:  97.8 F (36.6 C) 97.7 F (36.5 C)   TempSrc:  Oral Oral   SpO2: 99%     Weight:   66.4 kg   Height:   '5\' 6"'$  (1.676 m)    Eyes: PERRL, lids and conjunctivae normal ENMT: Mucous membranes are moist. Posterior pharynx clear of any exudate or lesions.Normal  dentition.  Neck: normal, supple, no masses, no thyromegaly Respiratory: clear to auscultation bilaterally, no wheezing, no crackles. Normal respiratory effort. No accessory muscle use.  Cardiovascular: Regular rate and rhythm, no murmurs / rubs / gallops. No extremity edema. 2+ pedal pulses. No carotid bruits.  Abdomen: no tenderness, no masses palpated. No hepatosplenomegaly. Bowel sounds positive.  Musculoskeletal: no clubbing / cyanosis. No joint deformity upper and lower extremities. Good ROM,  no contractures. Normal muscle tone.  Skin: no rashes, lesions, ulcers. No induration Neurologic: CN 2-12 grossly intact. Sensation intact, DTR normal. Strength 5/5 in all 4.  Psychiatric: Normal judgment and insight. Alert and oriented x 3. Normal mood.     Labs on Admission: I have personally reviewed following labs and imaging studies  CBC: Recent Labs  Lab 04/21/22 0043 04/21/22 0950  WBC 8.6 8.2  NEUTROABS 5.4 5.5  HGB 12.1* 10.0*  HCT 36.0* 29.8*  MCV 97.3 98.7  PLT 338 782   Basic Metabolic Panel: Recent Labs  Lab 04/21/22 0043  NA 139  K 3.9  CL 105  CO2 27  GLUCOSE 110*  BUN 35*  CREATININE 1.20  CALCIUM 8.8*   GFR: Estimated Creatinine Clearance: 42.8 mL/min (by C-G formula based on SCr of 1.2 mg/dL). Liver Function Tests: Recent Labs  Lab 04/21/22 0043  AST 34  ALT 29  ALKPHOS 70  BILITOT 0.5  PROT 7.5  ALBUMIN 3.8   No results for input(s): "LIPASE", "AMYLASE" in the last 168 hours. No results for input(s): "AMMONIA" in the last 168 hours. Coagulation Profile: Recent Labs  Lab 04/21/22 0043  INR 1.0   Cardiac Enzymes: No results for input(s): "CKTOTAL", "CKMB", "CKMBINDEX", "TROPONINI" in the last 168 hours. BNP (last 3 results) No results for input(s): "PROBNP" in the last 8760 hours. HbA1C: No results for input(s): "HGBA1C" in the last 72 hours. CBG: No results for input(s): "GLUCAP" in the last 168 hours. Lipid Profile: No results for input(s): "CHOL", "HDL", "LDLCALC", "TRIG", "CHOLHDL", "LDLDIRECT" in the last 72 hours. Thyroid Function Tests: No results for input(s): "TSH", "T4TOTAL", "FREET4", "T3FREE", "THYROIDAB" in the last 72 hours. Anemia Panel: No results for input(s): "VITAMINB12", "FOLATE", "FERRITIN", "TIBC", "IRON", "RETICCTPCT" in the last 72 hours. Urine analysis:    Component Value Date/Time   COLORURINE YELLOW 11/17/2021 0614   APPEARANCEUR HAZY (A) 11/17/2021 0614   LABSPEC 1.020 11/17/2021 0614   PHURINE 7.0  11/17/2021 0614   GLUCOSEU NEGATIVE 11/17/2021 0614   HGBUR NEGATIVE 11/17/2021 0614   BILIRUBINUR NEGATIVE 11/17/2021 0614   BILIRUBINUR neg 02/07/2019 1103   KETONESUR NEGATIVE 11/17/2021 0614   PROTEINUR 100 (A) 11/17/2021 0614   UROBILINOGEN 0.2 02/07/2019 1103   UROBILINOGEN 0.2 01/17/2014 0451   NITRITE NEGATIVE 11/17/2021 0614   LEUKOCYTESUR SMALL (A) 11/17/2021 9562    Radiological Exams on Admission: No results found.  EKG: Ordered  Assessment/Plan Principal Problem:   GI bleed  (please populate well all problems here in Problem List. (For example, if patient is on BP meds at home and you resume or decide to hold them, it is a problem that needs to be her. Same for CAD, COPD, HLD and so on)  Acute blood loss anemia -Secondary to lower GI bleed.  Suspect lower GI source/colon diverticulosis bleeding given there is a stable BUN/creatinine compared to last month and the symptoms of dark-colored blood in the stool but no melena. -Check hemoglobin 1600 and 2200 and tomorrow morning, transfuse for hemodynamic instability and significant drop of H&H.  Discussed with patient regarding options of IR guided CT angiogram if indicated in scenario such as hemodynamically instability or significant drop and H&H.  If H&H stable, will manage conservatively and GI work-up to follow.  [facility] consulted. -N.p.o., IV fluids and PPI  HTN -As needed hydralazine, hold off all home BP meds.  COPD -Stable, no symptoms or signs of acute exacerbation.  Gout -Stable, continue allopurinol  Peripheral neuropathy -Stable, continue gabapentin  DVT prophylaxis: SCD Code Status: Full code Family Communication: Wife at bedside Disposition Plan: Patient sick with ongoing GI bleed, expect more than 2 midnight hospital stay and expect inpatient GI work-up. Consults called: [facility] Admission status: Telemetry admission   Lequita Halt MD [facility] Pager [phone]  04/21/2022, 3:48 PM

## 2022-04-22 DIAGNOSIS — R578 Other shock: Secondary | ICD-10-CM | POA: Diagnosis not present

## 2022-04-22 DIAGNOSIS — R55 Syncope and collapse: Secondary | ICD-10-CM | POA: Diagnosis not present

## 2022-04-22 DIAGNOSIS — D62 Acute posthemorrhagic anemia: Secondary | ICD-10-CM | POA: Diagnosis not present

## 2022-04-22 DIAGNOSIS — K921 Melena: Secondary | ICD-10-CM | POA: Diagnosis not present

## 2022-04-22 LAB — BASIC METABOLIC PANEL
Anion gap: 8 (ref 5–15)
BUN: 35 mg/dL — ABNORMAL HIGH (ref 8–23)
CO2: 24 mmol/L (ref 22–32)
Calcium: 8.3 mg/dL — ABNORMAL LOW (ref 8.9–10.3)
Chloride: 110 mmol/L (ref 98–111)
Creatinine, Ser: 1.46 mg/dL — ABNORMAL HIGH (ref 0.61–1.24)
GFR, Estimated: 48 mL/min — ABNORMAL LOW (ref >60)
Glucose, Bld: 118 mg/dL — ABNORMAL HIGH (ref 70–99)
Potassium: 4.4 mmol/L (ref 3.5–5.1)
Sodium: 142 mmol/L (ref 135–145)

## 2022-04-22 LAB — MAGNESIUM: Magnesium: 2.1 mg/dL (ref 1.7–2.4)

## 2022-04-22 LAB — CBC
HCT: 31.5 % — ABNORMAL LOW (ref 39.0–52.0)
HCT: 30.6 % — ABNORMAL LOW (ref 39.0–52.0)
Hemoglobin: 10.4 g/dL — ABNORMAL LOW (ref 13.0–17.0)
Hemoglobin: 10.4 g/dL — ABNORMAL LOW (ref 13.0–17.0)
MCH: 30.4 pg (ref 26.0–34.0)
MCH: 30.9 pg (ref 26.0–34.0)
MCHC: 33 g/dL (ref 30.0–36.0)
MCHC: 34 g/dL (ref 30.0–36.0)
MCV: 92.1 fL (ref 80.0–100.0)
MCV: 90.8 fL (ref 80.0–100.0)
Platelets: 232 10*3/uL (ref 150–400)
Platelets: 227 10*3/uL (ref 150–400)
RBC: 3.42 MIL/uL — ABNORMAL LOW (ref 4.22–5.81)
RBC: 3.37 MIL/uL — ABNORMAL LOW (ref 4.22–5.81)
RDW: 18.5 % — ABNORMAL HIGH (ref 11.5–15.5)
RDW: 18.4 % — ABNORMAL HIGH (ref 11.5–15.5)
WBC: 18.1 10*3/uL — ABNORMAL HIGH (ref 4.0–10.5)
WBC: 15.8 10*3/uL — ABNORMAL HIGH (ref 4.0–10.5)
nRBC: 0 % (ref 0.0–0.2)
nRBC: 0 % (ref 0.0–0.2)

## 2022-04-22 LAB — GLUCOSE, CAPILLARY: Glucose-Capillary: 138 mg/dL — ABNORMAL HIGH (ref 70–99)

## 2022-04-22 LAB — LACTIC ACID, PLASMA: Lactic Acid, Venous: 1.1 mmol/L (ref 0.5–1.9)

## 2022-04-22 MED ORDER — CHLORHEXIDINE GLUCONATE CLOTH 2 % EX PADS
6.0000 | MEDICATED_PAD | Freq: Every day | CUTANEOUS | Status: DC
  Administered 2022-04-22 – 2022-04-26 (×4): 6 via TOPICAL

## 2022-04-22 MED ORDER — IPRATROPIUM-ALBUTEROL 0.5-2.5 (3) MG/3ML IN SOLN
3.0000 mL | Freq: Three times a day (TID) | RESPIRATORY_TRACT | Status: DC
  Administered 2022-04-23: 3 mL via RESPIRATORY_TRACT
  Filled 2022-04-22: qty 3

## 2022-04-22 MED ORDER — LEVOTHYROXINE SODIUM 75 MCG PO TABS
150.0000 ug | ORAL_TABLET | ORAL | Status: DC
  Administered 2022-04-26 – 2022-04-27 (×2): 150 ug via ORAL
  Filled 2022-04-22 (×3): qty 2

## 2022-04-22 MED ORDER — LEVOTHYROXINE SODIUM 75 MCG PO TABS
75.0000 ug | ORAL_TABLET | ORAL | Status: DC
  Administered 2022-04-23: 75 ug via ORAL
  Filled 2022-04-22: qty 1

## 2022-04-22 NOTE — Progress Notes (Deleted)
Pt sleeping w/periods of apnea w/desaturation into high 80's. O2 increased to 6L w/reduction in frequency of episodes. Pt uses CPAP at home but due to aspiration last evening, unit was only placed at bedside for possible use tonight. Pt also noted an abrasion on top of nasal bridge from home mask. If pt uses our mask +/or unit, we will pad area and assess.

## 2022-04-22 NOTE — Progress Notes (Signed)
Pt placed on cpap for the night on auto mode. And 1lt [place]. Tol well

## 2022-04-22 NOTE — Progress Notes (Signed)
Pt sleeping w/periods of apnea w/desaturation into high 80's. O2 increased to 6L w/reduction in frequency of these episodes. Pt uses CPAP at home but due to recent aspiration last evening, unit was only placed at bedside for possible use tonight. Pt also noted an abrasion on top of nasal bridge from home mask. If pt uses our mask +/or unit, we will pad area and assess q visit.

## 2022-04-22 NOTE — Consult Note (Signed)
NAME:  [name] Pratt, MRN:  [id number], DOB:  Dec 26, 1940, LOS: 1 ADMISSION DATE:  04/21/2022, CONSULTATION DATE: 04/21/2022 REFERRING MD: Dr. Roosevelt Locks, CHIEF COMPLAINT: Shock, hematochezia  History of Present Illness:  81 year old man with an extensive past medical history that includes diverticular disease and hemorrhoids with chronic constipation.  Also hyperlipidemia, hypertension, hypothyroidism, COPD, peripheral neuropathy.  He was admitted 8/11 with rectal bleeding.  He began to see hematochezia and melena 8/10, has continued to have red bowel movements.  He had a drop in hemoglobin from 12 > 10 > 8.8. Afternoon 04/21/2022 he had a near syncopal episode when he was in the bathroom, had 2 large bloody bowel movements.  He was assisted back to bed and had acute bradycardia, associated hypotension and loss of consciousness.  Atropine given x1 with restoration heart rate to 50s, blood pressure to 90s.  IV fluid resuscitation initiated and PCCM called to further evaluate. Gastroenterology has seen the patient and suspect diverticular bleeding given his history.  Have ordered CT angiography of the abdomen.  Pertinent  Medical History   Past Medical History:  Diagnosis Date   Allergic rhinitis 02/02/2016   Anemia 12/17/2009   Formatting of this note might be different from the original. Anemia  10/1 IMO update   Aortic atherosclerosis ([place]) 03/05/2020   Arthritis    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   Atypical chest pain 11/24/2015   Benign essential hypertension 05/13/2009   Qualifier: Diagnosis of  By: Larwance Sachs of this note might be different from the original. 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 Carotid U/S 3/81: LICA < 01%, RICA 75-10%;  b.   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   Cystoid macular edema of both eyes 07/28/2020   Degenerative disc disease, lumbar 06/30/2015   Deviated nasal septum 02/18/2015   Diverticulosis 03/03/2020   DJD (degenerative joint disease) of hip 12/24/2012   Dyspnea 06/12/2011   Emphysema, unspecified ([place]) 03/05/2020   Erectile dysfunction 06/20/2017   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[place]) 02/09/2021   Gait disorder 03/02/2021   GERD (gastroesophageal reflux disease)    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   Qualifier: Diagnosis of  By: Burnett Kanaris     Greater trochanteric pain syndrome 07/22/2021   Hand pain, left 01/16/2022   Hearing loss 02/02/2016   Heme positive stool 02/29/2020   HIATAL HERNIA 05/13/2009   Qualifier: Diagnosis of  By: Burnett Kanaris     High risk medication use 01/18/2012   Hip pain 03/02/2021   History of chicken pox    History of COVID-19 12/05/2021   History of hemorrhoids    History of kidney stones    History of skin cancer 07/22/2021   skin cancer   History of total bilateral knee replacement 07/08/2020   Hyperglycemia 03/03/2014   Hyperlipidemia with target LDL less than 130 12/27/2010  Formatting of this note might be different from the original. Cardiology - Dr Frances Nickels at [facility] cardiology  ICD-10 cut over   Hypertension    under control   Hypokalemia 07/08/2021   Hypothyroidism    Internal hemorrhoids 03/03/2020   Leg cramps 11/13/2019   Lower GI bleed 12/12/2012   Lumbago of lumbar region with sciatica 10/21/2020   Lumbar radiculopathy 03/02/2021    Lumbar spondylosis 11/02/2020   NEPHROLITHIASIS 05/13/2009   Qualifier: Diagnosis of  By: Burnett Kanaris     Nonspecific abnormal electrocardiogram (ECG) (EKG) 01/06/2013   Numbness of hand 01/24/2022   Onychomycosis 06/30/2015   Osteoarthrosis, 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 Osteoarthritis Of The Hand  10/1 IMO update   Osteoarthrosis, unspecified whether generalized or localized, pelvic region and thigh 07/25/2010   Formatting of this note might be different from the original. Osteoarthritis Of The Hip   Other abnormal glucose 07/22/2021   Other allergic rhinitis 02/18/2015   Palpitations 07/25/2021   Peripheral neuropathy 06/19/2016   Preventative health care 11/24/2015   Right groin pain 11/29/2012   Right inguinal hernia 05/13/2009   Qualifier: Diagnosis of  By: Burnett Kanaris     RLS (restless legs syndrome) 02/18/2015   Rosacea    Rotator cuff tear 07/27/2013   Situational depression 03/02/2021   Skin lesion 01/16/2022   Sleep apnea with use of continuous positive airway pressure (CPAP) 07/15/2013   CPAP set to  7 cm water,  Residual AHi was 7.8  With no leak.  User time 6 hours.  479 days , not used only 12 days - highly compliant 06-23-13  .    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   Urinary retention 11/26/2020   Ventral hernia 07/08/2012   Vertigo 07/08/2021   Weight loss 03/03/2020     Significant Hospital Events: Including procedures, antibiotic start and stop dates in addition to other pertinent events   CT angio abdomen 8/11 > no source of bleeding identified, no active bleeding  identified.  Right greater than left basilar patchy infiltrate 8/12 hemodynamically improved  Interim History / Subjective:   Hemodynamically improved CT angio abdomen without any clear bleeding source Unasyn started for possible aspiration Periods of desaturation overnight off of his CPAP  Objective   Blood pressure (!) 142/64, pulse (!) 52, temperature 98.1 F (36.7 C), temperature source Oral, resp. rate 18, height '5\' 6"'$  (1.676 m), weight 66.4 kg, SpO2 96 %.        Intake/Output Summary (Last 24 hours) at 04/22/2022 0724 Last data filed at 04/22/2022 0616 Gross per 24 hour  Intake 870 ml  Output 530 ml  Net 340 ml   Filed Weights   04/20/22 2313 04/21/22 1354  Weight: 64.4 kg 66.4 kg    Examination: General: comfortable, better color, NAD HENT: Oropharynx clear Lungs: Distant, clear bilaterally Cardiovascular: Regular, bradycardia 50s, no murmur Abdomen: No tenderness, positive bowel sounds Extremities: No edema Neuro: Awake, alert, interacting appropriately.  Much sharper than 8/11.  Nonfocal GU: Deferred  Resolved Hospital Problem list     Assessment & Plan:  Hemorrhagic shock, associated syncope Acute blood loss anemia  due to GI bleeding, apparently lower GI blood loss.  Suspect diverticular bleeding -Follow CBC closely, hemoglobin up to 10.4 -Appreciate GI evaluation, may ultimately require EGD, colonoscopy, other work-up -If recurrence of apparent lower GI bleeding, question angiography and IR for possible embolization -PPI ordered twice daily -Diet when okay with GI, water and ice chips for now  Acute metabolic encephalopathy.  Likely due to hypoperfusion state, anemia -Supportive care -Watch closely for airway protection. Dr. Roosevelt Locks confirmed with his wife that he is full code, would want full support  OSA with nocturnal hypoxemia -We will push to get him back on his CPAP this evening 8/13  Acute renal insufficiency.  Likely due to transient  hypoperfusion and ATN -Follow urine output, BMP closely -Restore adequate perfusion pressures -Avoid nephrotoxins -Renal dose medications  History of hypertension -Slowly reinitiate home antihypertensives as blood pressure stabilizes  Hypothyroidism -Continue Synthroid  Hypercholesterolemia -Continue rosuvastatin  History of COPD -Continue albuterol as needed.  No maintenance inhaled medication at this time  Gout -Continue allopurinol  Peripheral neuropathy -Continue gabapentin  Disposition -Probably back to progressive care on 8/12 if blood pressure remains stable and no evidence of further bleeding   Best Practice (right click and "Reselect all SmartList Selections" daily)   Diet/type: clear liquids DVT prophylaxis: SCD GI prophylaxis: PPI Lines: N/A Foley:  N/A Code Status:  full code Last date of multidisciplinary goals of care discussion [goals of care discussed with the patient's wife by Dr. Roosevelt Locks on 04/21/2022, confirmed full CODE STATUS]  Labs   CBC: Recent Labs  Lab 04/21/22 0043 04/21/22 0950 04/21/22 1630 04/21/22 2109 04/22/22 0604  WBC 8.6 8.2  --  18.0* 18.1*  NEUTROABS 5.4 5.5  --   --   --   HGB 12.1* 10.0* 8.8* 8.6* 10.4*  HCT 36.0* 29.8* 27.1* 26.0* 31.5*  MCV 97.3 98.7  --  100.0 92.1  PLT 338 301  --  285 366    Basic Metabolic Panel: Recent Labs  Lab 04/21/22 0043 04/22/22 0604  NA 139 142  K 3.9 4.4  CL 105 110  CO2 27 24  GLUCOSE 110* 118*  BUN 35* 35*  CREATININE 1.20 1.46*  CALCIUM 8.8* 8.3*  MG  --  2.1   GFR: Estimated Creatinine Clearance: 35.2 mL/min (A) (by C-G formula based on SCr of 1.46 mg/dL (H)). Recent Labs  Lab 04/21/22 0043 04/21/22 0950 04/21/22 2109 04/22/22 0604  WBC 8.6 8.2 18.0* 18.1*    Liver Function Tests: Recent Labs  Lab 04/21/22 0043  AST 34  ALT 29  ALKPHOS 70  BILITOT 0.5  PROT 7.5  ALBUMIN 3.8   No results for input(s): "LIPASE", "AMYLASE" in the last 168 hours. No results  for input(s): "AMMONIA" in the last 168 hours.  ABG No results found for: "PHART", "PCO2ART", "PO2ART", "HCO3", "TCO2", "ACIDBASEDEF", "O2SAT"   Coagulation Profile: Recent Labs  Lab 04/21/22 0043  INR 1.0    Cardiac Enzymes: No results for input(s): "CKTOTAL", "CKMB", "CKMBINDEX", "TROPONINI" in the last 168 hours.  HbA1C: Hgb A1c MFr Bld  Date/Time Value Ref Range Status  03/07/2022 11:38 AM 5.5 4.6 - 6.5 % Final    Comment:    Glycemic Control Guidelines for People with Diabetes:Non Diabetic:  <6%Goal of Therapy: <7%Additional Action Suggested:  >8%   12/29/2020 11:08 AM 5.7 4.6 - 6.5 % Final    Comment:    Glycemic Control Guidelines for People with Diabetes:Non Diabetic:  <6%Goal of Therapy: <7%Additional Action Suggested:  >  8%     CBG: Recent Labs  Lab 04/21/22 1934 04/21/22 2317 04/22/22 0325  GLUCAP 150* 150* 138*    Critical care time: NA     Baltazar Apo, MD, PhD 04/22/2022, 7:24 AM [facility] [phone] or if no answer before 7:00PM call [phone] For any issues after 7:00PM please call eLink [phone]

## 2022-04-22 NOTE — Progress Notes (Signed)
Called Elink/John, RN pt c/o burning with urination. Order received to send urine sample for U/A. Erling Conte, RN

## 2022-04-22 NOTE — Progress Notes (Addendum)
Attending physician's note   I have taken a history, reviewed the chart, and examined the patient. I performed a substantive portion of this encounter, including complete performance of at least one of the key components, in conjunction with the APP. I agree with the APP's note, impression, and recommendations with my edits.   Patient doing much better today.  No further bleeding overnight.  CTA with diverticulosis, but no active bleeding.  Did demonstrate aspiration pneumonia and he was started on Unasyn.  Transfused 2 unit PRBCs yesterday with appropriate serologic response.  Transferred to the ICU due to near syncope followed by bradycardia (atropine x1) yesterday.  We discussed the role/utility of colonoscopy to evaluate for etiology.  As the bleeding has stopped, he would like to proceed with observation at this point.  Since he had colonoscopy in 04/2020 (internal hemorrhoids, diverticulosis), the suspicion for malignant bleed or other high risk etiology is reduced.  Additionally, given recent cardiac events related to his GI bleed, seems reasonable to observe today without initiating bowel prep this evening.  - Clears today - Continue serial H/H checks with additional blood products as needed per protocol - Please call GI service if he has acute rebleeding which would necessitate starting bowel prep and subsequent colonoscopy - Antibiotics per primary ICU service - GI service will continue to follow  [name], DO, FACG [phone] office         Daily Progress Note  Hospital Day: 2  Chief Complaint: rectal bleeding  Brief History [name] Pratt is a 81 y.o. male with a pmh not limited to diverticulosis, hypothyroidism, HTN, COPD, peripheral neuropathy, remote hiatal hernia repair   Assessment / Plan   # 81 yo male with painless rectal bleeding . Suspect diverticular bleed.  He had a syncopal episode on toilet 8/11. This was followed by worsening  bradycardia. Transferred to 21M.   CTA last evening was negative for active bleeding No bleeding this am. Looks / feels much better.  Required blood transfusion.  Appreciate PCCM's assistance    # Acute on chronic [place] anemia.  Hgb declined to 8.8 , down from baseline of 12.  Received 2u PRBC last evening. Hgb now up to 10.4.   # Aspiration pneumonia? On Unasyn   # See PMH for additional medical problems   Subjective   Feels okay. No BMs / bleeding this am   Objective   Endoscopic studies:   Colonoscopy - Dr. Earlean Shawl in Aug 2021. Done for anemia and FOBT+ -Good prep.   - No polyps or cancers founds, + large hemorrhoids and diverticulosis   Imaging:  CT ANGIO GI BLEED  Result Date: 04/21/2022 CLINICAL DATA:  Lower GI hemorrhage EXAM: CTA ABDOMEN AND PELVIS WITHOUT AND WITH CONTRAST TECHNIQUE: Multidetector CT imaging of the abdomen and pelvis was performed using the standard protocol during bolus administration of intravenous contrast. Multiplanar reconstructed images and MIPs were obtained and reviewed to evaluate the vascular anatomy. RADIATION DOSE REDUCTION: This exam was performed according to the departmental dose-optimization program which includes automated exposure control, adjustment of the mA and/or kV according to patient size and/or use of iterative reconstruction technique. CONTRAST:  162m OMNIPAQUE IOHEXOL 350 MG/ML SOLN COMPARISON:  08/30/2020 FINDINGS: VASCULAR Aorta: Scattered atherosclerotic calcifications are noted. No evidence of aneurysmal dilatation or dissection is noted. Celiac: Variant anatomy is noted with the common hepatic artery arising separately from the aorta as well as a second vessel arising directly from the aorta supplying the  left gastric and splenic arteries SMA: Patent without evidence of aneurysm, dissection, vasculitis or significant stenosis. Renals: Single renal arteries are identified bilaterally with heavy atherosclerotic calcification. No  focal hemodynamically significant stenosis is noted. IMA: Mild stenosis is noted at the origin of the IMA. Inflow: Iliacs demonstrate atherosclerotic calcifications without aneurysmal dilatation or dissection. Veins: No specific venous abnormality is noted. Review of the MIP images confirms the above findings. NON-VASCULAR Lower chest: Bibasilar infiltrates are noted right greater than left which may be related aspiration. Correlate with clinical history. Hepatobiliary: No focal liver abnormality is seen. Status post cholecystectomy. No biliary dilatation. Pancreas: Unremarkable. No pancreatic ductal dilatation or surrounding inflammatory changes. Spleen: Normal in size without focal abnormality. Adrenals/Urinary Tract: Adrenal glands are within normal limits. Kidneys are well visualized bilaterally within normal enhancement pattern. Nonobstructing stone is again seen in the lower pole of the right kidney stable from the prior exam. Right upper pole renal cyst is noted. No further follow-up is recommended for this lesion. The bladder is decompressed. No obstructive changes are noted. Stomach/Bowel: Diffuse diverticular change is noted predominately within the descending and sigmoid colons. No evidence of diverticulitis is noted. No focal area of pooling of contrast is noted to suggest acute GI hemorrhage. Small bowel shows no obstructive changes. The stomach is within normal limits. Appendix is not well visualized and may have been surgically removed. No inflammatory changes are noted. Lymphatic: No lymphadenopathy is identified. Reproductive: Prostate is unremarkable. Other: No abdominal wall hernia or abnormality. No abdominopelvic ascites. Musculoskeletal: Bilateral hip replacements are noted causing scatter artifact in the pelvis. Degenerative changes of lumbar spine are seen. No compression deformities are noted. 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 IMPRESSION: VASCULAR No findings to suggest active GI  hemorrhage are seen. Scattered atherosclerotic changes are noted. Mild variant anatomy of the celiac axis is seen. NON-VASCULAR Diverticulosis without evidence of diverticulitis or acute GI hemorrhage. Bilateral lower lobe infiltrates right greater than left which may be related aspiration. Correlate with the clinical history. Electronically Signed   By: Inez Catalina M.D.   On: 04/21/2022 19:20    Lab Results: Recent Labs    04/21/22 2109 04/22/22 0604 04/22/22 0932  WBC 18.0* 18.1* 15.8*  HGB 8.6* 10.4* 10.4*  HCT 26.0* 31.5* 30.6*  PLT 285 232 227   BMET Recent Labs    04/21/22 0043 04/22/22 0604  NA 139 142  K 3.9 4.4  CL 105 110  CO2 27 24  GLUCOSE 110* 118*  BUN 35* 35*  CREATININE 1.20 1.46*  CALCIUM 8.8* 8.3*   LFT Recent Labs    04/21/22 0043  PROT 7.5  ALBUMIN 3.8  AST 34  ALT 29  ALKPHOS 70  BILITOT 0.5   PT/INR Recent Labs    04/21/22 0043  LABPROT 12.6  INR 1.0     Scheduled inpatient medications:   sodium chloride   Intravenous Once   sodium chloride   Intravenous Once   acetaminophen  1,000 mg Oral q morning   allopurinol  100 mg Oral BID   Chlorhexidine Gluconate Cloth  6 each Topical Daily   cycloSPORINE  1 drop Both Eyes BID   gabapentin  300 mg Oral BID   ipratropium-albuterol  3 mL Nebulization Q6H   [START ON 04/24/2022] levothyroxine  150 mcg Oral Once per day on Mon Tue Wed Thu Fri Sat   [START ON 04/23/2022] levothyroxine  75 mcg Oral Once per day on Sun   mirabegron ER  25  mg Oral Daily   pantoprazole (PROTONIX) IV  40 mg Intravenous Q12H   rosuvastatin  5 mg Oral Daily   scopolamine  1 patch Transdermal Q72H   Continuous inpatient infusions:   sodium chloride 125 mL/hr at 04/21/22 0205   sodium chloride     ampicillin-sulbactam (UNASYN) IV 3 g (04/22/22 0543)   norepinephrine (LEVOPHED) Adult infusion     PRN inpatient medications: albuterol, diclofenac Sodium, meclizine, ondansetron (ZOFRAN) IV, mouth rinse, polyethylene  glycol, sodium chloride, traMADol  Vital signs in last 24 hours: Temp:  [97.6 F (36.4 C)-98.2 F (36.8 C)] 97.7 F (36.5 C) (08/12 1100) Pulse Rate:  [48-83] 64 (08/12 1000) Resp:  [10-27] 19 (08/12 1000) BP: (80-163)/(39-81) 147/73 (08/12 1000) SpO2:  [82 %-100 %] 94 % (08/12 1000) Weight:  [66.4 kg] 66.4 kg (08/11 1354) Last BM Date : 04/21/22  Intake/Output Summary (Last 24 hours) at 04/22/2022 1120 Last data filed at 04/22/2022 0616 Gross per 24 hour  Intake 870 ml  Output 530 ml  Net 340 ml     Physical Exam:  General: Alert male in NAD.  Heart:  Regular rate and rhythm. No lower extremity edema Pulmonary: Normal respiratory effort Abdomen: Soft, nondistended, nontender. Normal bowel sounds.  Neurologic: Alert and oriented Psych: Pleasant. Cooperative.    Intake/Output from previous day: 08/11 0701 - 08/12 0700 In: 870 [Blood:870] Out: 530 [Urine:530] Intake/Output this shift: No intake/output data recorded.    Principal Problem:   GI bleed Active Problems:   Vasovagal syncope   Hemorrhagic shock (HCC)   Symptomatic anemia   Hematochezia   Acute blood loss anemia     LOS: 1 day   [name] Pratt ,NP 04/22/2022, 11:20 AM

## 2022-04-23 DIAGNOSIS — D649 Anemia, unspecified: Secondary | ICD-10-CM | POA: Diagnosis not present

## 2022-04-23 DIAGNOSIS — D62 Acute posthemorrhagic anemia: Secondary | ICD-10-CM | POA: Diagnosis not present

## 2022-04-23 DIAGNOSIS — K5791 Diverticulosis of intestine, part unspecified, without perforation or abscess with bleeding: Secondary | ICD-10-CM

## 2022-04-23 DIAGNOSIS — K921 Melena: Secondary | ICD-10-CM | POA: Diagnosis not present

## 2022-04-23 LAB — URINALYSIS, ROUTINE W REFLEX MICROSCOPIC
Bilirubin Urine: NEGATIVE
Glucose, UA: NEGATIVE mg/dL
Hgb urine dipstick: NEGATIVE
Ketones, ur: NEGATIVE mg/dL
Nitrite: NEGATIVE
Protein, ur: NEGATIVE mg/dL
Specific Gravity, Urine: 1.021 (ref 1.005–1.030)
pH: 5 (ref 5.0–8.0)

## 2022-04-23 LAB — TYPE AND SCREEN
ABO/RH(D): O POS
Antibody Screen: NEGATIVE
Unit division: 0
Unit division: 0

## 2022-04-23 LAB — BPAM RBC
Blood Product Expiration Date: 202309092359
Blood Product Expiration Date: 202309092359
ISSUE DATE / TIME: 202308112234
ISSUE DATE / TIME: 202308120105
Unit Type and Rh: 5100
Unit Type and Rh: 5100

## 2022-04-23 LAB — CBC
HCT: 27.8 % — ABNORMAL LOW (ref 39.0–52.0)
HCT: 30 % — ABNORMAL LOW (ref 39.0–52.0)
Hemoglobin: 9.1 g/dL — ABNORMAL LOW (ref 13.0–17.0)
Hemoglobin: 9.9 g/dL — ABNORMAL LOW (ref 13.0–17.0)
MCH: 30.3 pg (ref 26.0–34.0)
MCH: 30.7 pg (ref 26.0–34.0)
MCHC: 32.7 g/dL (ref 30.0–36.0)
MCHC: 33 g/dL (ref 30.0–36.0)
MCV: 92.7 fL (ref 80.0–100.0)
MCV: 93.2 fL (ref 80.0–100.0)
Platelets: 218 10*3/uL (ref 150–400)
Platelets: 234 10*3/uL (ref 150–400)
RBC: 3 MIL/uL — ABNORMAL LOW (ref 4.22–5.81)
RBC: 3.22 MIL/uL — ABNORMAL LOW (ref 4.22–5.81)
RDW: 18 % — ABNORMAL HIGH (ref 11.5–15.5)
RDW: 17.3 % — ABNORMAL HIGH (ref 11.5–15.5)
WBC: 11.2 10*3/uL — ABNORMAL HIGH (ref 4.0–10.5)
WBC: 10.9 10*3/uL — ABNORMAL HIGH (ref 4.0–10.5)
nRBC: 0 % (ref 0.0–0.2)
nRBC: 0 % (ref 0.0–0.2)

## 2022-04-23 LAB — BASIC METABOLIC PANEL
Anion gap: 8 (ref 5–15)
BUN: 31 mg/dL — ABNORMAL HIGH (ref 8–23)
CO2: 23 mmol/L (ref 22–32)
Calcium: 8.1 mg/dL — ABNORMAL LOW (ref 8.9–10.3)
Chloride: 110 mmol/L (ref 98–111)
Creatinine, Ser: 1.43 mg/dL — ABNORMAL HIGH (ref 0.61–1.24)
GFR, Estimated: 49 mL/min — ABNORMAL LOW (ref >60)
Glucose, Bld: 96 mg/dL (ref 70–99)
Potassium: 4.4 mmol/L (ref 3.5–5.1)
Sodium: 141 mmol/L (ref 135–145)

## 2022-04-23 LAB — GLUCOSE, CAPILLARY: Glucose-Capillary: 122 mg/dL — ABNORMAL HIGH (ref 70–99)

## 2022-04-23 MED ORDER — PEG-KCL-NACL-NASULF-NA ASC-C 100 G PO SOLR
0.5000 | Freq: Once | ORAL | Status: AC
  Administered 2022-04-23: 100 g via ORAL
  Filled 2022-04-23: qty 1

## 2022-04-23 MED ORDER — IPRATROPIUM-ALBUTEROL 0.5-2.5 (3) MG/3ML IN SOLN
3.0000 mL | Freq: Two times a day (BID) | RESPIRATORY_TRACT | Status: DC
  Administered 2022-04-23 – 2022-04-27 (×6): 3 mL via RESPIRATORY_TRACT
  Filled 2022-04-23 (×8): qty 3

## 2022-04-23 MED ORDER — PEG-KCL-NACL-NASULF-NA ASC-C 100 G PO SOLR: 1.0000 | Freq: Once | ORAL | Status: DC

## 2022-04-23 NOTE — Hospital Course (Addendum)
PMH of HTN, HLD, COPD, chronic constipation and hemorrhoids present to the hospital with complaints of rectal bleeding found to have acute blood loss anemia and hemorrhagic shock. CT angio x2 is not able to show any evidence of acute bleeding. Initial plan was conservative management although due to ongoing bleeding, underwent colonoscopy on 8/14.  It showed significant amount of fresh blood in the colon without any evidence of acute bleeding.

## 2022-04-23 NOTE — Anesthesia Preprocedure Evaluation (Addendum)
Anesthesia Evaluation  Patient identified by MRN, date of birth, ID band Patient awake    Reviewed: Allergy & Precautions, NPO status , Patient's Chart, lab work & pertinent test results  History of Anesthesia Complications Negative for: history of anesthetic complications  Airway Mallampati: III  TM Distance: >3 FB Neck ROM: Full    Dental  (+) Dental Advisory Given   Pulmonary sleep apnea , COPD, former smoker,    Pulmonary exam normal        Cardiovascular hypertension, Pt. on medications Normal cardiovascular exam   '22  TTE - EF 55 to 60%. There is mild concentric left ventricular hypertrophy. Grade I diastolic dysfunction (impaired relaxation). Mild MR.    Neuro/Psych PSYCHIATRIC DISORDERS Depression negative neurological ROS     GI/Hepatic Neg liver ROS, GERD  Controlled,  Endo/Other  Hypothyroidism   Renal/GU Renal InsufficiencyRenal disease     Musculoskeletal  (+) Arthritis ,   Abdominal   Peds  Hematology  (+) Blood dyscrasia, anemia ,   Anesthesia Other Findings   Reproductive/Obstetrics                            Anesthesia Physical Anesthesia Plan  ASA: 3  Anesthesia Plan: MAC   Post-op Pain Management:    Induction:   PONV Risk Score and Plan: 1 and Propofol infusion and Treatment may vary due to age or medical condition  Airway Management Planned: Nasal Cannula and Natural Airway  Additional Equipment: None  Intra-op Plan:   Post-operative Plan:   Informed Consent: I have reviewed the patients History and Physical, chart, labs and discussed the procedure including the risks, benefits and alternatives for the proposed anesthesia with the patient or authorized representative who has indicated his/her understanding and acceptance.       Plan Discussed with: CRNA and Anesthesiologist  Anesthesia Plan Comments:        Anesthesia Quick Evaluation

## 2022-04-23 NOTE — H&P (View-Only) (Signed)
Attending physician's note   I have taken a history, reviewed the chart, and examined the patient. I performed a substantive portion of this encounter, including complete performance of at least one of the key components, in conjunction with the APP. I agree with the APP's note, impression, and recommendations with my edits.   H/H down at 9.1/27.8, but no further e/o bleeding.  WBC down to 11 (from 15.8).  No plan for colonoscopy at this juncture.  Observe for e/o rebleeding and continue serial CBC checks for suspected equilibration.    [address] DO, FACG [phone] office         Daily Progress Note  Hospital Day: 3  Chief Complaint: rectal bleeding  Brief History [name] Pratt is a 81 y.o. male with a pmh not limited to diverticulosis, hypothyroidism, HTN, COPD, peripheral neuropathy, remote hiatal hernia repair   Assessment / Plan   # 81 yo male with painless rectal bleeding with syncopal episode . Suspect diverticular bleed.   CTA  negative for active bleeding No further bleeding yesterday nor today. Looks / feels okay. Tolerating clears.  Advance to solid diet.  No plans for colonoscopy at this point. Last colonoscopy was August 2021 ( Dr. Earlean Shawl). Good prep. Tortuous colon. Pan-diverticulosis, large internal hemorrhoids   # Acute on chronic [place] anemia.  Hgb declined to 8.8 , down from baseline of 12.  Received 2u PRBC has down overnight from 10.4 to 9.1 but in absence of bleeding this is probably equilibration.      # Aspiration pneumonia On Unasyn  Subjective   Feels okay, no bleeding today nor yesterday  Objective   Imaging:  CT ANGIO GI BLEED  Result Date: 04/21/2022 CLINICAL DATA:  Lower GI hemorrhage EXAM: CTA ABDOMEN AND PELVIS WITHOUT AND WITH CONTRAST TECHNIQUE: Multidetector CT imaging of the abdomen and pelvis was performed using the standard protocol during bolus administration of intravenous contrast. Multiplanar reconstructed  images and MIPs were obtained and reviewed to evaluate the vascular anatomy. RADIATION DOSE REDUCTION: This exam was performed according to the departmental dose-optimization program which includes automated exposure control, adjustment of the mA and/or kV according to patient size and/or use of iterative reconstruction technique. CONTRAST:  116m OMNIPAQUE IOHEXOL 350 MG/ML SOLN COMPARISON:  08/30/2020 FINDINGS: VASCULAR Aorta: Scattered atherosclerotic calcifications are noted. No evidence of aneurysmal dilatation or dissection is noted. Celiac: Variant anatomy is noted with the common hepatic artery arising separately from the aorta as well as a second vessel arising directly from the aorta supplying the left gastric and splenic arteries SMA: Patent without evidence of aneurysm, dissection, vasculitis or significant stenosis. Renals: Single renal arteries are identified bilaterally with heavy atherosclerotic calcification. No focal hemodynamically significant stenosis is noted. IMA: Mild stenosis is noted at the origin of the IMA. Inflow: Iliacs demonstrate atherosclerotic calcifications without aneurysmal dilatation or dissection. Veins: No specific venous abnormality is noted. Review of the MIP images confirms the above findings. NON-VASCULAR Lower chest: Bibasilar infiltrates are noted right greater than left which may be related aspiration. Correlate with clinical history. Hepatobiliary: No focal liver abnormality is seen. Status post cholecystectomy. No biliary dilatation. Pancreas: Unremarkable. No pancreatic ductal dilatation or surrounding inflammatory changes. Spleen: Normal in size without focal abnormality. Adrenals/Urinary Tract: Adrenal glands are within normal limits. Kidneys are well visualized bilaterally within normal enhancement pattern. Nonobstructing stone is again seen in the lower pole of the right kidney stable from the prior exam. Right upper pole renal cyst is  noted. No further follow-up  is recommended for this lesion. The bladder is decompressed. No obstructive changes are noted. Stomach/Bowel: Diffuse diverticular change is noted predominately within the descending and sigmoid colons. No evidence of diverticulitis is noted. No focal area of pooling of contrast is noted to suggest acute GI hemorrhage. Small bowel shows no obstructive changes. The stomach is within normal limits. Appendix is not well visualized and may have been surgically removed. No inflammatory changes are noted. Lymphatic: No lymphadenopathy is identified. Reproductive: Prostate is unremarkable. Other: No abdominal wall hernia or abnormality. No abdominopelvic ascites. Musculoskeletal: Bilateral hip replacements are noted causing scatter artifact in the pelvis. Degenerative changes of lumbar spine are seen. No compression deformities are noted. 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 IMPRESSION: VASCULAR No findings to suggest active GI hemorrhage are seen. Scattered atherosclerotic changes are noted. Mild variant anatomy of the celiac axis is seen. NON-VASCULAR Diverticulosis without evidence of diverticulitis or acute GI hemorrhage. Bilateral lower lobe infiltrates right greater than left which may be related aspiration. Correlate with the clinical history. Electronically Signed   By: Inez Catalina M.D.   On: 04/21/2022 19:20    Lab Results: Recent Labs    04/22/22 0604 04/22/22 0932 04/23/22 0047  WBC 18.1* 15.8* 11.2*  HGB 10.4* 10.4* 9.1*  HCT 31.5* 30.6* 27.8*  PLT 232 227 218   BMET Recent Labs    04/21/22 0043 04/22/22 0604 04/23/22 0047  NA 139 142 141  K 3.9 4.4 4.4  CL 105 110 110  CO2 '27 24 23  '$ GLUCOSE 110* 118* 96  BUN 35* 35* 31*  CREATININE 1.20 1.46* 1.43*  CALCIUM 8.8* 8.3* 8.1*   LFT Recent Labs    04/21/22 0043  PROT 7.5  ALBUMIN 3.8  AST 34  ALT 29  ALKPHOS 70  BILITOT 0.5   PT/INR Recent Labs    04/21/22 0043  LABPROT 12.6  INR 1.0     Scheduled inpatient  medications:   sodium chloride   Intravenous Once   sodium chloride   Intravenous Once   acetaminophen  1,000 mg Oral q morning   allopurinol  100 mg Oral BID   Chlorhexidine Gluconate Cloth  6 each Topical Daily   cycloSPORINE  1 drop Both Eyes BID   gabapentin  300 mg Oral BID   ipratropium-albuterol  3 mL Nebulization TID   [START ON 04/24/2022] levothyroxine  150 mcg Oral Once per day on Mon Tue Wed Thu Fri Sat   levothyroxine  75 mcg Oral Once per day on Sun   mirabegron ER  25 mg Oral Daily   pantoprazole (PROTONIX) IV  40 mg Intravenous Q12H   rosuvastatin  5 mg Oral Daily   scopolamine  1 patch Transdermal Q72H   Continuous inpatient infusions:   sodium chloride     ampicillin-sulbactam (UNASYN) IV Stopped (04/23/22 0507)   PRN inpatient medications: albuterol, diclofenac Sodium, meclizine, ondansetron (ZOFRAN) IV, mouth rinse, polyethylene glycol, sodium chloride, traMADol  Vital signs in last 24 hours: Temp:  [97.7 F (36.5 C)-98.4 F (36.9 C)] 98.2 F (36.8 C) (08/13 0830) Pulse Rate:  [46-80] 80 (08/13 0830) Resp:  [12-28] 15 (08/13 0830) BP: (119-166)/(50-88) 166/64 (08/13 0830) SpO2:  [88 %-100 %] 94 % (08/13 0830) Weight:  [67.4 kg] 67.4 kg (08/13 0500) Last BM Date : 04/21/22  Intake/Output Summary (Last 24 hours) at 04/23/2022 1014 Last data filed at 04/23/2022 0933 Gross per 24 hour  Intake 2581.75 ml  Output 1670  ml  Net 911.75 ml     Physical Exam:  General: Alert male in NAD Heart:  Regular rate and rhythm. No lower extremity edema Pulmonary: Normal respiratory effort Abdomen: Soft, nondistended, nontender. Normal bowel sounds.  Neurologic: Alert and oriented Psych: Pleasant. Cooperative.    Intake/Output from previous day: 08/12 0701 - 08/13 0700 In: 2581.8 [P.O.:1080; I.V.:1001.8; IV Piggyback:500] Out: 1250 [Urine:1250] Intake/Output this shift: Total I/O In: -  Out: 420 [Urine:420]    Principal Problem:   GI bleed Active  Problems:   Vasovagal syncope   Hemorrhagic shock (HCC)   Symptomatic anemia   Hematochezia   Acute blood loss anemia     LOS: 2 days   Tye Savoy ,NP 04/23/2022, 10:14 AM

## 2022-04-23 NOTE — Progress Notes (Signed)
  Progress Note Patient: [name] Pratt DIY:[id number] DOB: [birth year] DOA: 04/21/2022  DOS: the patient was seen and examined on 04/23/2022  Brief hospital course: PMH of HTN, HLD, COPD, chronic constipation and hemorrhoids present to the hospital with complaints of rectal bleeding found to have acute blood loss anemia and hemorrhagic shock. Assessment and Plan: Hemorrhagic shock with syncope. Acute blood loss anemia due to GI bleed. Most likely Acute diverticular bleed. Bleeding initially appeared that it has resolved. Required blood transfusion as well as pressors. CT angiography did not show any acute bleeding. Continue PPI twice daily. Currently scheduled for colonoscopy tomorrow. Monitor CBC and monitor in ICU.  Acute metabolic encephalopathy. Likely from hypoperfusion. Currently resolved  OSA. Continue CPAP as tolerated.  AKI Baseline serum creatinine 1.0 On presentation serum creatinine 1.46 Currently stable at 1.43.  Monitor.  HTN. Blood pressure medication currently on hold.  Hypothyroidism. Continue Synthroid.  HLD. Continue Crestor.  COPD. Does not appear to have any exacerbation for now.  Monitor.  Neuropathy. Continue gabapentin.  Subjective: No acute complaint.  Later in the day had a large bowel movement with blood.  GI discussed with the patient and initiated bowel prep for colonoscopy tomorrow.  Physical Exam: Vitals:   04/23/22 1000 04/23/22 1100 04/23/22 1200 04/23/22 1600  BP: 136/63 130/79 137/67 129/89  Pulse: 60 (!) 58 (!) 57 (!) 57  Resp: '12 19 16 15  '$ Temp:   98.3 F (36.8 C) 97.8 F (36.6 C)  TempSrc:   Oral Oral  SpO2: 92% 96% 94% 94%  Weight:      Height:       General: Appear in mild distress; no visible Abnormal Neck Mass Or lumps, Conjunctiva normal Cardiovascular: S1 and S2 Present, no Murmur, Respiratory: good respiratory effort, Bilateral Air entry present and CTA, no Crackles, no wheezes Abdomen: Bowel Sound present,  Non tender  Extremities: no Pedal edema Neurology: alert and oriented to time, place, and person  Gait not checked due to patient safety concerns   Data Reviewed: I have Reviewed nursing notes, Vitals, and Lab results since pt's last encounter. Pertinent lab results CBC and BMP I have ordered test including CBC and BMP I have discussed pt's care plan and test results with GI and ICU.   Family Communication: None at bedside.  Disposition: Status is: Inpatient Remains inpatient appropriate because: Requiring close observation in the ICU while undergoing bowel prep for colonoscopy tomorrow and monitor CBC.  Author: Berle Mull, MD 04/23/2022 6:29 PM  Please look on [email or web address] to find out who is on call.

## 2022-04-23 NOTE — Progress Notes (Addendum)
Attending physician's note   I have taken a history, reviewed the chart, and examined the patient. I performed a substantive portion of this encounter, including complete performance of at least one of the key components, in conjunction with the APP. I agree with the APP's note, impression, and recommendations with my edits.   H/H down at 9.1/27.8, but no further e/o bleeding.  WBC down to 11 (from 15.8).  No plan for colonoscopy at this juncture.  Observe for e/o rebleeding and continue serial CBC checks for suspected equilibration.    [address], DO, FACG [phone] office         Daily Progress Note  Hospital Day: 3  Chief Complaint: rectal bleeding  Brief History [name] Pratt is a 81 y.o. male with a pmh not limited to diverticulosis, hypothyroidism, HTN, COPD, peripheral neuropathy, remote hiatal hernia repair   Assessment / Plan   # 81 yo male with painless rectal bleeding with syncopal episode . Suspect diverticular bleed.   CTA  negative for active bleeding No further bleeding yesterday nor today. Looks / feels okay. Tolerating clears.  Advance to solid diet.  No plans for colonoscopy at this point. Last colonoscopy was August 2021 ( Dr. Earlean Shawl). Good prep. Tortuous colon. Pan-diverticulosis, large internal hemorrhoids   # Acute on chronic [place] anemia.  Hgb declined to 8.8 , down from baseline of 12.  Received 2u PRBC has down overnight from 10.4 to 9.1 but in absence of bleeding this is probably equilibration.      # Aspiration pneumonia On Unasyn  Subjective   Feels okay, no bleeding today nor yesterday  Objective   Imaging:  CT ANGIO GI BLEED  Result Date: 04/21/2022 CLINICAL DATA:  Lower GI hemorrhage EXAM: CTA ABDOMEN AND PELVIS WITHOUT AND WITH CONTRAST TECHNIQUE: Multidetector CT imaging of the abdomen and pelvis was performed using the standard protocol during bolus administration of intravenous contrast. Multiplanar reconstructed  images and MIPs were obtained and reviewed to evaluate the vascular anatomy. RADIATION DOSE REDUCTION: This exam was performed according to the departmental dose-optimization program which includes automated exposure control, adjustment of the mA and/or kV according to patient size and/or use of iterative reconstruction technique. CONTRAST:  154m OMNIPAQUE IOHEXOL 350 MG/ML SOLN COMPARISON:  08/30/2020 FINDINGS: VASCULAR Aorta: Scattered atherosclerotic calcifications are noted. No evidence of aneurysmal dilatation or dissection is noted. Celiac: Variant anatomy is noted with the common hepatic artery arising separately from the aorta as well as a second vessel arising directly from the aorta supplying the left gastric and splenic arteries SMA: Patent without evidence of aneurysm, dissection, vasculitis or significant stenosis. Renals: Single renal arteries are identified bilaterally with heavy atherosclerotic calcification. No focal hemodynamically significant stenosis is noted. IMA: Mild stenosis is noted at the origin of the IMA. Inflow: Iliacs demonstrate atherosclerotic calcifications without aneurysmal dilatation or dissection. Veins: No specific venous abnormality is noted. Review of the MIP images confirms the above findings. NON-VASCULAR Lower chest: Bibasilar infiltrates are noted right greater than left which may be related aspiration. Correlate with clinical history. Hepatobiliary: No focal liver abnormality is seen. Status post cholecystectomy. No biliary dilatation. Pancreas: Unremarkable. No pancreatic ductal dilatation or surrounding inflammatory changes. Spleen: Normal in size without focal abnormality. Adrenals/Urinary Tract: Adrenal glands are within normal limits. Kidneys are well visualized bilaterally within normal enhancement pattern. Nonobstructing stone is again seen in the lower pole of the right kidney stable from the prior exam. Right upper pole renal cyst is  noted. No further follow-up  is recommended for this lesion. The bladder is decompressed. No obstructive changes are noted. Stomach/Bowel: Diffuse diverticular change is noted predominately within the descending and sigmoid colons. No evidence of diverticulitis is noted. No focal area of pooling of contrast is noted to suggest acute GI hemorrhage. Small bowel shows no obstructive changes. The stomach is within normal limits. Appendix is not well visualized and may have been surgically removed. No inflammatory changes are noted. Lymphatic: No lymphadenopathy is identified. Reproductive: Prostate is unremarkable. Other: No abdominal wall hernia or abnormality. No abdominopelvic ascites. Musculoskeletal: Bilateral hip replacements are noted causing scatter artifact in the pelvis. Degenerative changes of lumbar spine are seen. No compression deformities are noted. 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 IMPRESSION: VASCULAR No findings to suggest active GI hemorrhage are seen. Scattered atherosclerotic changes are noted. Mild variant anatomy of the celiac axis is seen. NON-VASCULAR Diverticulosis without evidence of diverticulitis or acute GI hemorrhage. Bilateral lower lobe infiltrates right greater than left which may be related aspiration. Correlate with the clinical history. Electronically Signed   By: Inez Catalina M.D.   On: 04/21/2022 19:20    Lab Results: Recent Labs    04/22/22 0604 04/22/22 0932 04/23/22 0047  WBC 18.1* 15.8* 11.2*  HGB 10.4* 10.4* 9.1*  HCT 31.5* 30.6* 27.8*  PLT 232 227 218   BMET Recent Labs    04/21/22 0043 04/22/22 0604 04/23/22 0047  NA 139 142 141  K 3.9 4.4 4.4  CL 105 110 110  CO2 '27 24 23  '$ GLUCOSE 110* 118* 96  BUN 35* 35* 31*  CREATININE 1.20 1.46* 1.43*  CALCIUM 8.8* 8.3* 8.1*   LFT Recent Labs    04/21/22 0043  PROT 7.5  ALBUMIN 3.8  AST 34  ALT 29  ALKPHOS 70  BILITOT 0.5   PT/INR Recent Labs    04/21/22 0043  LABPROT 12.6  INR 1.0     Scheduled inpatient  medications:   sodium chloride   Intravenous Once   sodium chloride   Intravenous Once   acetaminophen  1,000 mg Oral q morning   allopurinol  100 mg Oral BID   Chlorhexidine Gluconate Cloth  6 each Topical Daily   cycloSPORINE  1 drop Both Eyes BID   gabapentin  300 mg Oral BID   ipratropium-albuterol  3 mL Nebulization TID   [START ON 04/24/2022] levothyroxine  150 mcg Oral Once per day on Mon Tue Wed Thu Fri Sat   levothyroxine  75 mcg Oral Once per day on Sun   mirabegron ER  25 mg Oral Daily   pantoprazole (PROTONIX) IV  40 mg Intravenous Q12H   rosuvastatin  5 mg Oral Daily   scopolamine  1 patch Transdermal Q72H   Continuous inpatient infusions:   sodium chloride     ampicillin-sulbactam (UNASYN) IV Stopped (04/23/22 0507)   PRN inpatient medications: albuterol, diclofenac Sodium, meclizine, ondansetron (ZOFRAN) IV, mouth rinse, polyethylene glycol, sodium chloride, traMADol  Vital signs in last 24 hours: Temp:  [97.7 F (36.5 C)-98.4 F (36.9 C)] 98.2 F (36.8 C) (08/13 0830) Pulse Rate:  [46-80] 80 (08/13 0830) Resp:  [12-28] 15 (08/13 0830) BP: (119-166)/(50-88) 166/64 (08/13 0830) SpO2:  [88 %-100 %] 94 % (08/13 0830) Weight:  [67.4 kg] 67.4 kg (08/13 0500) Last BM Date : 04/21/22  Intake/Output Summary (Last 24 hours) at 04/23/2022 1014 Last data filed at 04/23/2022 0933 Gross per 24 hour  Intake 2581.75 ml  Output 1670  ml  Net 911.75 ml     Physical Exam:  General: Alert male in NAD Heart:  Regular rate and rhythm. No lower extremity edema Pulmonary: Normal respiratory effort Abdomen: Soft, nondistended, nontender. Normal bowel sounds.  Neurologic: Alert and oriented Psych: Pleasant. Cooperative.    Intake/Output from previous day: 08/12 0701 - 08/13 0700 In: 2581.8 [P.O.:1080; I.V.:1001.8; IV Piggyback:500] Out: 1250 [Urine:1250] Intake/Output this shift: Total I/O In: -  Out: 420 [Urine:420]    Principal Problem:   GI bleed Active  Problems:   Vasovagal syncope   Hemorrhagic shock (HCC)   Symptomatic anemia   Hematochezia   Acute blood loss anemia     LOS: 2 days   Tye Savoy ,NP 04/23/2022, 10:14 AM

## 2022-04-24 ENCOUNTER — Inpatient Hospital Stay (HOSPITAL_COMMUNITY): Payer: Medicare HMO

## 2022-04-24 ENCOUNTER — Inpatient Hospital Stay (HOSPITAL_COMMUNITY): Payer: Medicare HMO | Admitting: Anesthesiology

## 2022-04-24 ENCOUNTER — Encounter (HOSPITAL_COMMUNITY)
Admission: EM | Disposition: A | Discharge status: Home Health | Payer: Self-pay | Source: Home / Self Care | Attending: Internal Medicine

## 2022-04-24 ENCOUNTER — Encounter (HOSPITAL_COMMUNITY): Payer: Self-pay | Admitting: Internal Medicine

## 2022-04-24 DIAGNOSIS — K5731 Diverticulosis of large intestine without perforation or abscess with bleeding: Secondary | ICD-10-CM

## 2022-04-24 DIAGNOSIS — K635 Polyp of colon: Secondary | ICD-10-CM | POA: Diagnosis not present

## 2022-04-24 DIAGNOSIS — Z87891 Personal history of nicotine dependence: Secondary | ICD-10-CM

## 2022-04-24 DIAGNOSIS — I10 Essential (primary) hypertension: Secondary | ICD-10-CM

## 2022-04-24 DIAGNOSIS — G9341 Metabolic encephalopathy: Secondary | ICD-10-CM

## 2022-04-24 DIAGNOSIS — K921 Melena: Secondary | ICD-10-CM | POA: Diagnosis not present

## 2022-04-24 DIAGNOSIS — K648 Other hemorrhoids: Secondary | ICD-10-CM | POA: Diagnosis not present

## 2022-04-24 DIAGNOSIS — K5791 Diverticulosis of intestine, part unspecified, without perforation or abscess with bleeding: Secondary | ICD-10-CM | POA: Diagnosis not present

## 2022-04-24 DIAGNOSIS — J690 Pneumonitis due to inhalation of food and vomit: Secondary | ICD-10-CM | POA: Diagnosis present

## 2022-04-24 DIAGNOSIS — K922 Gastrointestinal hemorrhage, unspecified: Secondary | ICD-10-CM | POA: Diagnosis not present

## 2022-04-24 HISTORY — DX: Metabolic encephalopathy: G93.41

## 2022-04-24 HISTORY — PX: COLONOSCOPY WITH PROPOFOL: SHX5780

## 2022-04-24 LAB — CBC
HCT: 29 % — ABNORMAL LOW (ref 39.0–52.0)
HCT: 24.2 % — ABNORMAL LOW (ref 39.0–52.0)
HCT: 24.2 % — ABNORMAL LOW (ref 39.0–52.0)
Hemoglobin: 9.4 g/dL — ABNORMAL LOW (ref 13.0–17.0)
Hemoglobin: 8 g/dL — ABNORMAL LOW (ref 13.0–17.0)
Hemoglobin: 8.1 g/dL — ABNORMAL LOW (ref 13.0–17.0)
MCH: 30.4 pg (ref 26.0–34.0)
MCH: 30.7 pg (ref 26.0–34.0)
MCH: 31.3 pg (ref 26.0–34.0)
MCHC: 32.4 g/dL (ref 30.0–36.0)
MCHC: 33.1 g/dL (ref 30.0–36.0)
MCHC: 33.5 g/dL (ref 30.0–36.0)
MCV: 93.9 fL (ref 80.0–100.0)
MCV: 92.7 fL (ref 80.0–100.0)
MCV: 93.4 fL (ref 80.0–100.0)
Platelets: 244 10*3/uL (ref 150–400)
Platelets: 240 10*3/uL (ref 150–400)
Platelets: 281 10*3/uL (ref 150–400)
RBC: 3.09 MIL/uL — ABNORMAL LOW (ref 4.22–5.81)
RBC: 2.61 MIL/uL — ABNORMAL LOW (ref 4.22–5.81)
RBC: 2.59 MIL/uL — ABNORMAL LOW (ref 4.22–5.81)
RDW: 17.2 % — ABNORMAL HIGH (ref 11.5–15.5)
RDW: 16.8 % — ABNORMAL HIGH (ref 11.5–15.5)
RDW: 17.1 % — ABNORMAL HIGH (ref 11.5–15.5)
WBC: 10.7 10*3/uL — ABNORMAL HIGH (ref 4.0–10.5)
WBC: 10.3 10*3/uL (ref 4.0–10.5)
WBC: 10.9 10*3/uL — ABNORMAL HIGH (ref 4.0–10.5)
nRBC: 0 % (ref 0.0–0.2)
nRBC: 0 % (ref 0.0–0.2)
nRBC: 0 % (ref 0.0–0.2)

## 2022-04-24 LAB — GLUCOSE, CAPILLARY
Glucose-Capillary: 101 mg/dL — ABNORMAL HIGH (ref 70–99)
Glucose-Capillary: 112 mg/dL — ABNORMAL HIGH (ref 70–99)
Glucose-Capillary: 121 mg/dL — ABNORMAL HIGH (ref 70–99)

## 2022-04-24 LAB — BASIC METABOLIC PANEL
Anion gap: 9 (ref 5–15)
BUN: 21 mg/dL (ref 8–23)
CO2: 23 mmol/L (ref 22–32)
Calcium: 8.4 mg/dL — ABNORMAL LOW (ref 8.9–10.3)
Chloride: 111 mmol/L (ref 98–111)
Creatinine, Ser: 1.34 mg/dL — ABNORMAL HIGH (ref 0.61–1.24)
GFR, Estimated: 53 mL/min — ABNORMAL LOW (ref >60)
Glucose, Bld: 128 mg/dL — ABNORMAL HIGH (ref 70–99)
Potassium: 3.4 mmol/L — ABNORMAL LOW (ref 3.5–5.1)
Sodium: 143 mmol/L (ref 135–145)

## 2022-04-24 LAB — MAGNESIUM: Magnesium: 1.8 mg/dL (ref 1.7–2.4)

## 2022-04-24 SURGERY — COLONOSCOPY WITH PROPOFOL
Anesthesia: Monitor Anesthesia Care

## 2022-04-24 MED ORDER — MAGNESIUM SULFATE 2 GM/50ML IV SOLN
2.0000 g | Freq: Once | INTRAVENOUS | Status: AC
Start: 2022-04-24 — End: 2022-04-24
  Administered 2022-04-24: 2 g via INTRAVENOUS
  Filled 2022-04-24: qty 50

## 2022-04-24 MED ORDER — IOHEXOL 350 MG/ML SOLN
100.0000 mL | Freq: Once | INTRAVENOUS | Status: AC | PRN
  Administered 2022-04-24: 100 mL via INTRAVENOUS

## 2022-04-24 MED ORDER — PEG-KCL-NACL-NASULF-NA ASC-C 100 G PO SOLR
1.0000 | Freq: Once | ORAL | Status: AC
Start: 2022-04-24 — End: 2022-04-24
  Administered 2022-04-24: 200 g via ORAL
  Filled 2022-04-24: qty 1

## 2022-04-24 MED ORDER — SODIUM CHLORIDE 0.9 % IV SOLN: INTRAVENOUS | Status: DC

## 2022-04-24 MED ORDER — LACTATED RINGERS IV SOLN: INTRAVENOUS | Status: DC | PRN

## 2022-04-24 MED ORDER — BISACODYL 5 MG PO TBEC
20.0000 mg | DELAYED_RELEASE_TABLET | Freq: Once | ORAL | Status: AC
  Administered 2022-04-24: 20 mg via ORAL
  Filled 2022-04-24: qty 4

## 2022-04-24 MED ORDER — PROPOFOL 500 MG/50ML IV EMUL
INTRAVENOUS | Status: DC | PRN
  Administered 2022-04-24: 100 ug/kg/min via INTRAVENOUS

## 2022-04-24 MED ORDER — POTASSIUM CHLORIDE 10 MEQ/100ML IV SOLN
10.0000 meq | INTRAVENOUS | Status: AC
  Administered 2022-04-24 (×2): 10 meq via INTRAVENOUS
  Filled 2022-04-24 (×2): qty 100

## 2022-04-24 MED ORDER — HALOPERIDOL LACTATE 5 MG/ML IJ SOLN
5.0000 mg | Freq: Once | INTRAMUSCULAR | Status: AC | PRN
  Administered 2022-04-24: 5 mg via INTRAVENOUS
  Filled 2022-04-24: qty 1

## 2022-04-24 MED ORDER — PHENYLEPHRINE 80 MCG/ML (10ML) SYRINGE FOR IV PUSH (FOR BLOOD PRESSURE SUPPORT)
PREFILLED_SYRINGE | INTRAVENOUS | Status: DC | PRN
  Administered 2022-04-24 (×3): 160 ug via INTRAVENOUS
  Administered 2022-04-24: 80 ug via INTRAVENOUS

## 2022-04-24 MED ORDER — ACETAMINOPHEN 325 MG PO TABS
650.0000 mg | ORAL_TABLET | Freq: Four times a day (QID) | ORAL | Status: DC | PRN
  Administered 2022-04-25 – 2022-04-26 (×2): 650 mg via ORAL
  Filled 2022-04-24 (×2): qty 2

## 2022-04-24 SURGICAL SUPPLY — 22 items

## 2022-04-24 NOTE — Transfer of Care (Signed)
Immediate Anesthesia Transfer of Care Note  Patient: [name] Pratt  Procedure(s) Performed: COLONOSCOPY WITH PROPOFOL  Patient Location: PACU  Anesthesia Type:MAC  Level of Consciousness: awake and drowsy  Airway & Oxygen Therapy: Patient Spontanous Breathing  Post-op Assessment: Report given to RN, Post -op Vital signs reviewed and stable and Patient moving all extremities X 4  Post vital signs: Reviewed and stable  Last Vitals:  Vitals Value Taken Time  BP 137/63 04/24/22 0925  Temp    Pulse 74 04/24/22 0927  Resp 13 04/24/22 0927  SpO2 97 % 04/24/22 0927  Vitals shown include unvalidated device data.  Last Pain:  Vitals:   04/24/22 0800  TempSrc: Temporal  PainSc: 0-No pain      Patients Stated Pain Goal: 0 (35/70/17 7939)  Complications: No notable events documented.

## 2022-04-24 NOTE — Progress Notes (Signed)
Pt not on cpap tonight due to prep for a colonostomy tomorrow.

## 2022-04-24 NOTE — Anesthesia Postprocedure Evaluation (Signed)
Anesthesia Post Note  Patient: [name] Pratt  Procedure(s) Performed: COLONOSCOPY WITH PROPOFOL     Patient location during evaluation: PACU Anesthesia Type: MAC Level of consciousness: awake and alert Pain management: pain level controlled Vital Signs Assessment: post-procedure vital signs reviewed and stable Respiratory status: spontaneous breathing, nonlabored ventilation and respiratory function stable Cardiovascular status: stable and blood pressure returned to baseline Anesthetic complications: no   No notable events documented.  Last Vitals:  Vitals:   04/24/22 0930 04/24/22 0945  BP: (!) 149/69 (!) 143/69  Pulse: 72 78  Resp: 13 15  Temp:  36.4 C  SpO2: 96% 96%    Last Pain:  Vitals:   04/24/22 0945  TempSrc:   PainSc: 0-No pain                 Audry Pili

## 2022-04-24 NOTE — Interval H&P Note (Signed)
History and Physical Interval Note:  04/24/2022 8:29 AM  [name] Pratt  has presented today for surgery, with the diagnosis of hematochezia.  The various methods of treatment have been discussed with the patient and family. After consideration of risks, benefits and other options for treatment, the patient has consented to  Procedure(s): COLONOSCOPY WITH PROPOFOL (N/A) as a surgical intervention.  The patient's history has been reviewed, patient examined, no change in status, stable for surgery.  I have reviewed the patient's chart and labs.  Questions were answered to the patient's satisfaction.     [place]

## 2022-04-24 NOTE — Progress Notes (Signed)
Progress Note Patient: [name] Pratt EGB:[id number] DOB: 01-09-1941 DOA: 04/21/2022  DOS: the patient was seen and examined on 04/24/2022  Brief hospital course: PMH of HTN, HLD, COPD, chronic constipation and hemorrhoids present to the hospital with complaints of rectal bleeding found to have acute blood loss anemia and hemorrhagic shock. CT angio x2 is not able to show any evidence of acute bleeding. Initial plan was conservative management although due to ongoing bleeding, underwent colonoscopy on 8/14.  It showed significant amount of fresh blood in the colon without any evidence of acute bleeding.  Assessment and Plan: Hemorrhagic shock with syncope. Acute blood loss anemia due to GI bleed. Most likely Acute diverticular bleed. Bleeding initially appeared that it has resolved. Required blood transfusion as well as pressors. CT angiography did not show any acute bleeding. Continue PPI twice daily. Underwent colonoscopy on 8/14.  1 polyp without any acute bleeding but copious amount of blood seen in the colon as well as in terminal ileum. We will continue to monitor serial hemoglobin with transfusion threshold for hemoglobin less than 8. Repeat CTA on 8/14 shows no evidence of acute bleeding. Patient is scheduled for colonoscopy again tomorrow to see if source of bleeding can be identified.  Acute metabolic encephalopathy. Likely from hypoperfusion. Briefly resolved but now appears to have-year-old recur. Currently suspect this is due to anesthesia. Monitor closely.   OSA. CPAP order although patient unable to tolerate.   AKI Baseline serum creatinine 1.0 On presentation serum creatinine 1.46 Currently stable at 1.43.  Monitor.   HTN. Blood pressure medication currently on hold.   Hypothyroidism. Continue Synthroid.   HLD. Continue Crestor.   COPD. Does not appear to have any exacerbation for now.  Monitor. Bilateral basal crackles.  Will benefit from incentive  spirometry use.  Aspiration pneumonia ([place]) Patient was started on Unasyn for suspected aspiration pneumonia when he developed hypotension from GI bleed. Currently we will continue it as the patient appears to have more increased leukocytosis although will complete antibiotic in 5 days.  Subjective: Appears to be more confused today.  No nausea no vomiting no abdominal pain.  No other acute events overnight.  Physical Exam: Vitals:   04/24/22 0930 04/24/22 0945 04/24/22 1009 04/24/22 1100  BP: (!) 149/69 (!) 143/69 108/72   Pulse: 72 78 77   Resp: '13 15 14   '$ Temp:  97.6 F (36.4 C)  97.9 F (36.6 C)  TempSrc:    Oral  SpO2: 96% 96% 94%   Weight:      Height:       General: Appear in mild distress; no visible Abnormal Neck Mass Or lumps, Conjunctiva normal Cardiovascular: S1 and S2 Present, no Murmur, Respiratory: good respiratory effort, Bilateral Air entry present and CTA, no Crackles, no wheezes Abdomen: Bowel Sound present, Non tender  Extremities:  no Pedal edema Neurology: alert and oriented to time, place, and person  Gait not checked due to patient safety concerns   Data Reviewed: I have Reviewed nursing notes, Vitals, and Lab results since pt's last encounter. Pertinent lab results CBC and BMP I have ordered test including CBC and BMP I have discussed pt's care plan and test results with GI.   Family Communication: Attempted to reach wife at phone available in demographics not successful.  Left voicemail.  Disposition: Status is: Inpatient Remains inpatient appropriate because: Still actively bleeding requiring close observation and therapy.  Author: Berle Mull, MD 04/24/2022 7:37 PM  Please look on [email or web address] to find out  who is on call.

## 2022-04-24 NOTE — Evaluation (Signed)
Occupational Therapy Evaluation Patient Details Name: [name] Pratt MRN: [id number] DOB: December 12, 1940 Today's Date: 04/24/2022   History of Present Illness Pt is a 81 y/o male presenting on 8/11 with rectal bleeding. Found with hemorrhagic shock with syncope, anemia due to GI bleed.  Colonoscopy 8/14. PMH inculdes: anemia, arthritis, skin CA, HTN, cervical myelopathy, COPD, cranial nerve IV palsy, DJD, B knee replacement, vertigo.   Clinical Impression   PTA patient independent and driving. Admitted for above and presents with problem list below, including generalized weakness, decreased activity tolerance and impaired balance.  He is oriented and follows commands, is slightly impulsive but easily redirected. Pt in recliner upon entry, completing transfers with min assist, ADLs with up to min assist for UB/LB ADLs and total assist for toileting.  RN notified of large, bright red bloody stool on BSC.  Pt fatigued once returned back to recliner.  Based on performance today, will follow acutely to optimize independence, safety and return to PLOF.      Recommendations for follow up therapy are one component of a multi-disciplinary discharge planning process, led by the attending physician.  Recommendations may be updated based on patient status, additional functional criteria and insurance authorization.   Follow Up Recommendations  Home health OT (vs no OT follow up pending progress)    Assistance Recommended at Discharge Intermittent Supervision/Assistance  Patient can return home with the following A little help with walking and/or transfers;A lot of help with bathing/dressing/bathroom;Assist for transportation;Help with stairs or ramp for entrance;Assistance with cooking/housework    Functional Status Assessment  Patient has had a recent decline in their functional status and demonstrates the ability to make significant improvements in function in a reasonable and predictable amount of time.   Equipment Recommendations  BSC/3in1    Recommendations for Other Services       Precautions / Restrictions Precautions Precautions: Fall Restrictions Weight Bearing Restrictions: No      Mobility Bed Mobility               General bed mobility comments: OOB in recliner upon entry    Transfers Overall transfer level: Needs assistance Equipment used: None, Rolling walker (2 wheels) Transfers: Sit to/from Stand, Bed to chair/wheelchair/BSC Sit to Stand: Min assist     Step pivot transfers: Min assist     General transfer comment: no AD stand step to BSC from recliner, RW back to recliner due to fatigue and safety      Balance Overall balance assessment: Needs assistance Sitting-balance support: No upper extremity supported, Feet supported Sitting balance-Leahy Scale: Fair Sitting balance - Comments: dynamically managing socks with supervision   Standing balance support: Bilateral upper extremity supported, No upper extremity supported, During functional activity Standing balance-Leahy Scale: Fair                             ADL either performed or assessed with clinical judgement   ADL Overall ADL's : Needs assistance/impaired     Grooming: Set up;Sitting           Upper Body Dressing : Set up;Sitting   Lower Body Dressing: Minimal assistance;Sit to/from stand   Toilet Transfer: Minimal assistance;Stand-pivot;BSC/3in1   Toileting- Clothing Manipulation and Hygiene: Total assistance;Sit to/from stand Toileting - Clothing Manipulation Details (indicate cue type and reason): bloody BM, RN notified     Functional mobility during ADLs: Minimal assistance;Cueing for safety General ADL Comments: pt slightly impulsive but easily redirected  Vision   Vision Assessment?: No apparent visual deficits     Perception     Praxis      Pertinent Vitals/Pain Pain Assessment Pain Assessment: No/denies pain     Hand Dominance Right    Extremity/Trunk Assessment Upper Extremity Assessment Upper Extremity Assessment: Generalized weakness (hx of cervical myopathy and decreased sensation/FMC)   Lower Extremity Assessment Lower Extremity Assessment: Defer to PT evaluation       Communication Communication Communication: No difficulties   Cognition Arousal/Alertness: Awake/alert Behavior During Therapy: WFL for tasks assessed/performed Overall Cognitive Status: Within Functional Limits for tasks assessed                                 General Comments: slightly impulsive but easily redirected     General Comments  VSS on RA, pt with large bloody (bright red) stool on BSC (with some bleeding on pad which was on chair)    Exercises     Shoulder Instructions      Home Living Family/patient expects to be discharged to:: Private residence Living Arrangements: Spouse/significant other Available Help at Discharge: Family Type of Home: House Home Access: Stairs to enter Technical brewer of Steps: 5 Entrance Stairs-Rails: Right;Left Home Layout: Multi-level Alternate Level Stairs-Number of Steps: 12 Alternate Level Stairs-Rails: Left Bathroom Shower/Tub: Occupational psychologist: Handicapped height     Home Equipment: Shower seat - built in;Cane - single Barista (2 wheels);Grab bars - tub/shower          Prior Functioning/Environment Prior Level of Function : Independent/Modified Independent;Driving             Mobility Comments: uses cane for mobility ADLs Comments: independent ADLs, light IADLs        OT Problem List: Decreased strength;Impaired balance (sitting and/or standing);Decreased activity tolerance;Decreased safety awareness      OT Treatment/Interventions: Self-care/ADL training;Therapeutic exercise;DME and/or AE instruction;Therapeutic activities;Balance training;Patient/family education;Energy conservation    OT Goals(Current goals can be  found in the care plan section) Acute Rehab OT Goals Patient Stated Goal: get better OT Goal Formulation: With patient Time For Goal Achievement: 05/08/22 Potential to Achieve Goals: Good  OT Frequency: Min 2X/week    Co-evaluation              AM-PAC OT "6 Clicks" Daily Activity     Outcome Measure Help from another person eating meals?: A Little Help from another person taking care of personal grooming?: A Little Help from another person toileting, which includes using toliet, bedpan, or urinal?: Total Help from another person bathing (including washing, rinsing, drying)?: A Lot Help from another person to put on and taking off regular upper body clothing?: A Little Help from another person to put on and taking off regular lower body clothing?: A Little 6 Click Score: 15   End of Session Equipment Utilized During Treatment: Rolling walker (2 wheels) Nurse Communication: Mobility status  Activity Tolerance: Patient tolerated treatment well Patient left: in chair;with call bell/phone within reach;with chair alarm set  OT Visit Diagnosis: Other abnormalities of gait and mobility (R26.89);Muscle weakness (generalized) (M62.81)                Time: 9509-3267 OT Time Calculation (min): 23 min Charges:  OT General Charges $OT Visit: 1 Visit OT Evaluation $OT Eval Moderate Complexity: 1 Mod OT Treatments $Self Care/Home Management : 8-22 mins  Jolaine Artist, OT Acute Rehabilitation Services Office  [place] 04/24/2022, 2:13 PM

## 2022-04-24 NOTE — Anesthesia Preprocedure Evaluation (Signed)
Anesthesia Evaluation  Patient identified by MRN, date of birth, ID band Patient awake    Reviewed: Allergy & Precautions, NPO status , Patient's Chart, lab work & pertinent test results, reviewed documented beta blocker date and time   History of Anesthesia Complications Negative for: history of anesthetic complications  Airway Mallampati: III  TM Distance: >3 FB Neck ROM: Full    Dental  (+) Teeth Intact, Caps, Dental Advisory Given   Pulmonary shortness of breath and with exertion, sleep apnea and Continuous Positive Airway Pressure Ventilation , pneumonia, resolved, COPD, former smoker,    Pulmonary exam normal breath sounds clear to auscultation       Cardiovascular hypertension, Pt. on medications + Peripheral Vascular Disease  Normal cardiovascular exam+ Valvular Problems/Murmurs  Rhythm:Regular Rate:Normal  Normal stress test and unremarkable echo 2022  EKG 04/23/22 Sinus bradycardia, 1st deg AV block, RBBB+ LAFB, lateral infarct  Echo 08/24/21 1. Left ventricular ejection fraction, by estimation, is 55 to 60%. The left ventricle has normal function. The left ventricle has no regional wall motion abnormalities. There is mild concentric left ventricular hypertrophy. Left ventricular diastolic parameters are consistent with Grade I diastolic dysfunction (impaired relaxation).  2. Right ventricular systolic function is normal. The right ventricular size is normal.  3. The mitral valve is degenerative. Mild mitral valve regurgitation. No evidence of mitral stenosis.  4. The aortic valve is tricuspid. Aortic valve regurgitation is not visualized. Aortic valve sclerosis is present, with no evidence of aortic valve stenosis.  5. The inferior vena cava is normal in size with greater than 50% respiratory variability, suggesting right atrial pressure of 3 mmHg.   Neuro/Psych PSYCHIATRIC DISORDERS Depression Carotid  stenosis Peripheral neuropathy  Neuromuscular disease    GI/Hepatic Neg liver ROS, hiatal hernia, GERD  Medicated,Hematochezia   Endo/Other  Hypothyroidism   Renal/GU Renal disease (Cr 1.34)  negative genitourinary   Musculoskeletal negative musculoskeletal ROS (+)   Abdominal   Peds  Hematology  (+) Blood dyscrasia, anemia ,   Anesthesia Other Findings   Reproductive/Obstetrics                           Anesthesia Physical Anesthesia Plan  ASA: 3  Anesthesia Plan: MAC   Post-op Pain Management: Minimal or no pain anticipated   Induction: Intravenous  PONV Risk Score and Plan: 1 and Propofol infusion, TIVA and Treatment may vary due to age or medical condition  Airway Management Planned: Natural Airway, Nasal Cannula and Simple Face Mask  Additional Equipment: None  Intra-op Plan:   Post-operative Plan:   Informed Consent: I have reviewed the patients History and Physical, chart, labs and discussed the procedure including the risks, benefits and alternatives for the proposed anesthesia with the patient or authorized representative who has indicated his/her understanding and acceptance.     Dental advisory given  Plan Discussed with: Anesthesiologist and CRNA  Anesthesia Plan Comments:        Anesthesia Quick Evaluation

## 2022-04-24 NOTE — Anesthesia Procedure Notes (Signed)
Procedure Name: MAC Date/Time: 04/24/2022 8:45 AM  Performed by: Harden Mo, CRNAPre-anesthesia Checklist: Patient identified, Emergency Drugs available, Suction available and Patient being monitored Patient Re-evaluated:Patient Re-evaluated prior to induction Oxygen Delivery Method: Simple face mask Preoxygenation: Pre-oxygenation with 100% oxygen Induction Type: IV induction Placement Confirmation: positive ETCO2 and breath sounds checked- equal and bilateral Dental Injury: Teeth and Oropharynx as per pre-operative assessment

## 2022-04-24 NOTE — Evaluation (Signed)
Physical Therapy Evaluation Patient Details Name: [name] Pratt MRN: [id number] DOB: 1940/09/23 Today's Date: 04/24/2022  History of Present Illness  Pt is a 81 y/o male presenting on 8/11 with rectal bleeding. Found with hemorrhagic shock with syncope, anemia due to GI bleed.  Colonoscopy 8/14. PMH inculdes: anemia, arthritis, skin CA, HTN, cervical myelopathy, COPD, cranial nerve IV palsy, DJD, B knee replacement, vertigo.  Clinical Impression  Pt admitted with/for rectal bleeding.  Pt is not at baseline now, but expect quick return of function once bleeding stopped.  Presently, pt needing min  to min guard assist for basic mobility. .  Pt currently limited functionally due to the problems listed below.  (see problems list.)  Pt will benefit from PT to maximize function and safety to be able to get home safely with available assist.        Recommendations for follow up therapy are one component of a multi-disciplinary discharge planning process, led by the attending physician.  Recommendations may be updated based on patient status, additional functional criteria and insurance authorization.  Follow Up Recommendations Home health PT (or no follow up dependent on progress)      Assistance Recommended at Discharge Set up Supervision/Assistance  Patient can return home with the following  A little help with bathing/dressing/bathroom;Assistance with cooking/housework;Assist for transportation    Equipment Recommendations None recommended by PT  Recommendations for Other Services       Functional Status Assessment Patient has had a recent decline in their functional status and demonstrates the ability to make significant improvements in function in a reasonable and predictable amount of time.     Precautions / Restrictions Precautions Precautions: Fall Restrictions Weight Bearing Restrictions: No      Mobility  Bed Mobility Overal bed mobility: Modified Independent Bed  Mobility: Sit to Supine       Sit to supine: Modified independent (Device/Increase time)        Transfers Overall transfer level: Needs assistance Equipment used:  (IV pole) Transfers: Sit to/from Stand, Bed to chair/wheelchair/BSC Sit to Stand: Min assist   Step pivot transfers: Min assist       General transfer comment: no AD stand step to [facility] from recliner, IV pole back to recliner due to fatigue and safety    Ambulation/Gait                  Stairs            Wheelchair Mobility    Modified Rankin (Stroke Patients Only)       Balance Overall balance assessment: Needs assistance Sitting-balance support: No upper extremity supported, Feet supported Sitting balance-Leahy Scale: Fair Sitting balance - Comments: dynamically managing socks with supervision   Standing balance support: Bilateral upper extremity supported, No upper extremity supported, During functional activity Standing balance-Leahy Scale: Fair                               Pertinent Vitals/Pain      Home Living Family/patient expects to be discharged to:: Private residence Living Arrangements: Spouse/significant other Available Help at Discharge: Family Type of Home: House Home Access: Stairs to enter Entrance Stairs-Rails: Psychiatric nurse of Steps: 5 Alternate Level Stairs-Number of Steps: 12 Home Layout: Multi-level Home Equipment: [place] in;Cane - single Barista (2 wheels);Grab bars - tub/shower      Prior Function Prior Level of Function : Independent/Modified Independent;Driving  Mobility Comments: uses cane for mobility ADLs Comments: independent ADLs, light IADLs     Hand Dominance   Dominant Hand: Right    Extremity/Trunk Assessment   Upper Extremity Assessment Upper Extremity Assessment: Generalized weakness    Lower Extremity Assessment Lower Extremity Assessment: Generalized  weakness       Communication   Communication: No difficulties  Cognition Arousal/Alertness: Awake/alert Behavior During Therapy: WFL for tasks assessed/performed Overall Cognitive Status: Within Functional Limits for tasks assessed                                 General Comments: slightly impulsive but easily redirected        General Comments General comments (skin integrity, edema, etc.): vss on RA    Exercises     Assessment/Plan    PT Assessment Patient needs continued PT services  PT Problem List Decreased strength;Decreased activity tolerance;Decreased mobility;Decreased balance       PT Treatment Interventions      PT Goals (Current goals can be found in the Care Plan section)  Acute Rehab PT Goals Patient Stated Goal: home independent PT Goal Formulation: With patient Time For Goal Achievement: 05/08/22 Potential to Achieve Goals: Good    Frequency Min 3X/week     Co-evaluation               AM-PAC PT "6 Clicks" Mobility  Outcome Measure Help needed turning from your back to your side while in a flat bed without using bedrails?: None Help needed moving from lying on your back to sitting on the side of a flat bed without using bedrails?: None Help needed moving to and from a bed to a chair (including a wheelchair)?: A Little Help needed standing up from a chair using your arms (e.g., wheelchair or bedside chair)?: A Little Help needed to walk in hospital room?: A Little Help needed climbing 3-5 steps with a railing? : A Little 6 Click Score: 20    End of Session   Activity Tolerance: Patient tolerated treatment well Patient left: in bed;with call bell/phone within reach (going to CT) Nurse Communication: Mobility status PT Visit Diagnosis: Unsteadiness on feet (R26.81)    Time: 1224-4975 PT Time Calculation (min) (ACUTE ONLY): 11 min   Charges:   PT Evaluation $PT Eval Moderate Complexity: 1 Mod           04/24/2022  [name] Carne., PT Acute Rehabilitation Services [phone]  (pager) [phone]  (office)  [name] Pratt 04/24/2022, 4:30 PM

## 2022-04-24 NOTE — Op Note (Addendum)
[facility] Patient Name: [name] Pratt Procedure Date : 04/24/2022 MRN: [id number] Attending MD: Thornton Park MD, MD Date of Birth: 25-May-1941 CSN: [id number] Age: 81 Admit Type: Inpatient Procedure:                Colonoscopy Indications:              Hematochezia                           Negative CTA 04/21/22 with ongoing bleeding since                            that time Providers:                Thornton Park MD, MD, Benay Pillow, RN, Darliss Cheney, Technician Referring MD:              Medicines:                Monitored Anesthesia Care Complications:            No immediate complications. Estimated Blood Loss:     Estimated blood loss: none. Procedure:                Pre-Anesthesia Assessment:                           - Prior to the procedure, a History and Physical                            was performed, and patient medications and                            allergies were reviewed. The patient's tolerance of                            previous anesthesia was also reviewed. The risks                            and benefits of the procedure and the sedation                            options and risks were discussed with the patient.                            All questions were answered, and informed consent                            was obtained. Prior Anticoagulants: The patient has                            taken no previous anticoagulant or antiplatelet                            agents. ASA Grade Assessment: III - A patient with  severe systemic disease. After reviewing the risks                            and benefits, the patient was deemed in                            satisfactory condition to undergo the procedure.                           After obtaining informed consent, the colonoscope                            was passed under direct vision. Throughout the                             procedure, the patient's blood pressure, pulse, and                            oxygen saturations were monitored continuously. The                            CF-HQ190L ([id number] Olympus coloscope was                            introduced through the anus and advanced to the 4                            cm into the ileum. The colonoscopy was technically                            difficult and complex due to poor endoscopic                            visualization. The patient tolerated the procedure                            well. The quality of the bowel preparation was                            inadequate. The terminal ileum, ileocecal valve,                            appendiceal orifice, and rectum were photographed. Scope In: 8:54:35 AM Scope Out: 9:16:29 AM Scope Withdrawal Time: 0 hours 15 minutes 0 seconds  Total Procedure Duration: 0 hours 21 minutes 54 seconds  Findings:      The perianal and digital rectal examinations were normal.      Non-bleeding internal hemorrhoids were found.      Multiple small and large-mouthed diverticula were found throughout the       colon but are most dense in the sigmoid colon and descending colon.      A 8 mm polyp was found in the cecum. The polyp was sessile. A smaller       polyp was seen more distally in the colon but the location  was unclear.      Clotted blood and liquid blood was found in the entire colon.      Food fibers and debris were found in the distal ileum, interfering with       visualization and limited by ability to see beyond 4 cm of the TI. There       is mild backwash blood in the most distal small bowel but not seen more       proximally. Impression:               - Preparation of the colon was inadequate.                           - Non-bleeding internal hemorrhoids.                           - Diverticulosis in the sigmoid colon and in the                            descending colon.                           - One  8 mm polyp in the cecum and a small polyp                            more distally. Not removed in the setting of                            unexplained GI bleeding.                           - Blood in the entire examined colon and backwash                            blood in the terminal ileum.                           - Food fibers in the distal ileum.                           - No specimens collected. Recommendation:           - Clear liquid diet today. NPO at 5am.                           - Continue present medications.                           - Serial hgb/hct with transfusion today.                           - Repeat CTA +/- angio today.                           - Bowel prep tonight for repeat colonoscopy  tomorrow. Procedure Code(s):        --- Professional ---                           [phone], Colonoscopy, flexible; diagnostic, including                            collection of specimen(s) by brushing or washing,                            when performed (separate procedure) Diagnosis Code(s):        --- Professional ---                           K64.8, Other hemorrhoids                           K63.5, Polyp of colon                           K92.2, Gastrointestinal hemorrhage, unspecified                           K92.1, Melena (includes Hematochezia)                           K57.30, Diverticulosis of large intestine without                            perforation or abscess without bleeding CPT copyright 2019 [employer]. All rights reserved. The codes documented in this report are preliminary and upon coder review may  be revised to meet current compliance requirements. Thornton Park MD, MD 04/24/2022 9:36:54 AM This report has been signed electronically. Number of Addenda: 0

## 2022-04-25 DIAGNOSIS — K922 Gastrointestinal hemorrhage, unspecified: Secondary | ICD-10-CM | POA: Diagnosis not present

## 2022-04-25 DIAGNOSIS — K5791 Diverticulosis of intestine, part unspecified, without perforation or abscess with bleeding: Secondary | ICD-10-CM | POA: Diagnosis not present

## 2022-04-25 LAB — CBC
HCT: 19.9 % — ABNORMAL LOW (ref 39.0–52.0)
HCT: 32 % — ABNORMAL LOW (ref 39.0–52.0)
Hemoglobin: 6.5 g/dL — CL (ref 13.0–17.0)
Hemoglobin: 10.9 g/dL — ABNORMAL LOW (ref 13.0–17.0)
MCH: 30.7 pg (ref 26.0–34.0)
MCH: 30.1 pg (ref 26.0–34.0)
MCHC: 32.7 g/dL (ref 30.0–36.0)
MCHC: 34.1 g/dL (ref 30.0–36.0)
MCV: 93.9 fL (ref 80.0–100.0)
MCV: 88.4 fL (ref 80.0–100.0)
Platelets: 227 10*3/uL (ref 150–400)
Platelets: 245 10*3/uL (ref 150–400)
RBC: 2.12 MIL/uL — ABNORMAL LOW (ref 4.22–5.81)
RBC: 3.62 MIL/uL — ABNORMAL LOW (ref 4.22–5.81)
RDW: 17.1 % — ABNORMAL HIGH (ref 11.5–15.5)
RDW: 17.2 % — ABNORMAL HIGH (ref 11.5–15.5)
WBC: 8.7 10*3/uL (ref 4.0–10.5)
WBC: 13 10*3/uL — ABNORMAL HIGH (ref 4.0–10.5)
nRBC: 0 % (ref 0.0–0.2)
nRBC: 0 % (ref 0.0–0.2)

## 2022-04-25 LAB — BASIC METABOLIC PANEL
Anion gap: 8 (ref 5–15)
BUN: 12 mg/dL (ref 8–23)
CO2: 23 mmol/L (ref 22–32)
Calcium: 8 mg/dL — ABNORMAL LOW (ref 8.9–10.3)
Chloride: 111 mmol/L (ref 98–111)
Creatinine, Ser: 1.3 mg/dL — ABNORMAL HIGH (ref 0.61–1.24)
GFR, Estimated: 55 mL/min — ABNORMAL LOW (ref >60)
Glucose, Bld: 97 mg/dL (ref 70–99)
Potassium: 3.2 mmol/L — ABNORMAL LOW (ref 3.5–5.1)
Sodium: 142 mmol/L (ref 135–145)

## 2022-04-25 LAB — PREPARE RBC (CROSSMATCH)

## 2022-04-25 LAB — MAGNESIUM: Magnesium: 1.9 mg/dL (ref 1.7–2.4)

## 2022-04-25 MED ORDER — POTASSIUM CHLORIDE 10 MEQ/100ML IV SOLN
INTRAVENOUS | Status: AC
  Filled 2022-04-25: qty 100

## 2022-04-25 MED ORDER — POTASSIUM CHLORIDE 10 MEQ/100ML IV SOLN
10.0000 meq | INTRAVENOUS | Status: AC
  Administered 2022-04-25 (×4): 10 meq via INTRAVENOUS
  Filled 2022-04-25 (×4): qty 100

## 2022-04-25 MED ORDER — LINACLOTIDE 145 MCG PO CAPS
290.0000 ug | ORAL_CAPSULE | Freq: Once | ORAL | Status: AC
Start: 2022-04-25 — End: 2022-04-25
  Administered 2022-04-25: 290 ug via ORAL
  Filled 2022-04-25: qty 2

## 2022-04-25 MED ORDER — HYDRALAZINE HCL 10 MG PO TABS
10.0000 mg | ORAL_TABLET | Freq: Three times a day (TID) | ORAL | Status: DC
  Administered 2022-04-25 – 2022-04-27 (×6): 10 mg via ORAL
  Filled 2022-04-25 (×6): qty 1

## 2022-04-25 MED ORDER — GABAPENTIN 600 MG PO TABS
300.0000 mg | ORAL_TABLET | Freq: Two times a day (BID) | ORAL | Status: DC
  Administered 2022-04-25 – 2022-04-27 (×5): 300 mg via ORAL
  Filled 2022-04-25: qty 1
  Filled 2022-04-25: qty 0.5
  Filled 2022-04-25: qty 1
  Filled 2022-04-25 (×2): qty 0.5
  Filled 2022-04-25 (×2): qty 1

## 2022-04-25 MED ORDER — POTASSIUM CHLORIDE 10 MEQ/100ML IV SOLN
10.0000 meq | INTRAVENOUS | Status: AC
  Administered 2022-04-25 (×2): 10 meq via INTRAVENOUS
  Filled 2022-04-25: qty 100

## 2022-04-25 MED ORDER — BISACODYL 5 MG PO TBEC: 20.0000 mg | DELAYED_RELEASE_TABLET | Freq: Every day | ORAL | Status: DC | PRN

## 2022-04-25 MED ORDER — HALOPERIDOL LACTATE 5 MG/ML IJ SOLN
2.0000 mg | Freq: Four times a day (QID) | INTRAMUSCULAR | Status: DC | PRN
  Administered 2022-04-25: 2 mg via INTRAVENOUS
  Filled 2022-04-25: qty 1

## 2022-04-25 MED ORDER — PEG-KCL-NACL-NASULF-NA ASC-C 100 G PO SOLR
1.0000 | Freq: Once | ORAL | Status: AC
Start: 2022-04-25 — End: 2022-04-25
  Administered 2022-04-25: 200 g via ORAL
  Filled 2022-04-25: qty 1

## 2022-04-25 MED ORDER — SODIUM CHLORIDE 0.9% IV SOLUTION: Freq: Once | INTRAVENOUS | Status: AC

## 2022-04-25 MED ORDER — MAGNESIUM SULFATE 2 GM/50ML IV SOLN
2.0000 g | Freq: Once | INTRAVENOUS | Status: AC
Start: 2022-04-25 — End: 2022-04-25
  Administered 2022-04-25: 2 g via INTRAVENOUS
  Filled 2022-04-25: qty 50

## 2022-04-25 NOTE — Progress Notes (Addendum)
Progress Note Patient: [name] Pratt AYT:[id number] DOB: 11/26/1940 DOA: 04/21/2022  DOS: the patient was seen and examined on 04/25/2022  Brief hospital course: PMH of HTN, HLD, COPD, chronic constipation and hemorrhoids present to the hospital with complaints of rectal bleeding found to have acute blood loss anemia and hemorrhagic shock. CT angio x2 is not able to show any evidence of acute bleeding. Initial plan was conservative management although due to ongoing bleeding, underwent colonoscopy on 8/14.  It showed significant amount of fresh blood in the colon without any evidence of acute bleeding. Assessment and Plan: Hemorrhagic shock with syncope. Acute blood loss anemia due to GI bleed. Most likely Acute diverticular bleed. Bleeding initially appeared that it has resolved. Required blood transfusion as well as pressors. CT angiography did not show any acute bleeding. Continue PPI twice daily. Underwent colonoscopy on 8/14.  1 polyp without any acute bleeding but copious amount of blood seen in the colon as well as in terminal ileum. We will continue to monitor serial hemoglobin with transfusion threshold for hemoglobin less than 8. Repeat CTA on 8/14 shows no evidence of acute bleeding. Patient is scheduled for repeat colonoscopy again tomorrow to see if source of bleeding can be identified. Per GI-we will repeat a CTA if the patient has episode of bleeding. Hemoglobin on 8/15 is 6.5.  3 PRBC ordered.   Acute metabolic encephalopathy. Likely from hypoperfusion. Briefly resolved but now appears to have-year-old recur. Currently suspect this is due to anesthesia. Monitor closely.   OSA. CPAP order although patient unable to tolerate.   AKI Baseline serum creatinine 1.0 On presentation serum creatinine 1.46 Currently stable at 1.43.  Monitor.   Hypertensive urgency Appears to have isolated systolic hypertension Patient had a new syncopal event and required IV fluids and  pressors initially. Therefore we will tolerate higher systolic blood pressure for now.  Continue as needed hydralazine.  SVT Monitor for now. Currently only brief in duration.  Correct electrolytes.   Hypothyroidism. Continue Synthroid.   HLD. Continue Crestor.   COPD. Does not appear to have any exacerbation for now.  Monitor. Bilateral basal crackles.  Will benefit from incentive spirometry use.   Aspiration pneumonia ([place]) Patient was started on Unasyn for suspected aspiration pneumonia when he developed hypotension from GI bleed. Currently we will continue it as the patient appears to have more increased leukocytosis although will complete antibiotic in 5 days.  Subjective: Mentation improving.  No nausea no vomiting.  No agitation during the day.  Continues to have bowel movement with blood.  No abdominal pain.  Physical Exam: Vitals:   04/25/22 1743 04/25/22 1800 04/25/22 1801 04/25/22 1959  BP: (!) 169/84 (!) 140/106 (!) 171/67   Pulse: 66 65 69   Resp: 15 20 (!) 21   Temp:      TempSrc:      SpO2: 100% 100% 100% 100%  Weight:      Height:       General: Appear in mild distress; no visible Abnormal Neck Mass Or lumps, Conjunctiva normal Cardiovascular: S1 and S2 Present, no Murmur, Respiratory: good respiratory effort, Bilateral Air entry present and CTA, no Crackles, no wheezes Abdomen: Bowel Sound present, Non tender  Extremities: no Pedal edema Neurology: alert and oriented to time, place, and person  Gait not checked due to patient safety concerns   Data Reviewed: I have Reviewed nursing notes, Vitals, and Lab results since pt's last encounter. Pertinent lab results CBC and BMP I have ordered test  including CBC and BMP I have discussed pt's care plan and test results with GI.   Family Communication: GI will reach out to the patient today.  Disposition: Status is: Inpatient Remains inpatient appropriate because: Need for further work-up to identify  bleeding source.  Author: Berle Mull, MD 04/25/2022 8:09 PM  Please look on [email or web address] to find out who is on call.

## 2022-04-25 NOTE — Progress Notes (Signed)
Pt on bowel prep for surgery tomorrow- will not wear CPAP tonight.

## 2022-04-25 NOTE — Progress Notes (Signed)
Daily Rounding Note  04/25/2022, 11:26 AM  LOS: 4 days   SUBJECTIVE:   Chief complaint: Hematochezia.  No complaints.  OBJECTIVE:         Vital signs in last 24 hours:    Temp:  [98.2 F (36.8 C)-99.6 F (37.6 C)] 98.7 F (37.1 C) (08/15 1017) Pulse Rate:  [65-103] 74 (08/15 1017) Resp:  [14-26] 20 (08/15 1017) BP: (116-163)/(59-81) 145/66 (08/15 1017) SpO2:  [90 %-100 %] 100 % (08/15 1017) Last BM Date : 04/23/22 Filed Weights   04/20/22 2313 04/21/22 1354 04/23/22 0500  Weight: 64.4 kg 66.4 kg 67.4 kg   General: Elderly, looks well.  Alert.  Comfortable Heart: RRR Chest: No labored breathing or cough Abdomen: Active bowel sounds, nontender, nondistended. Extremities: No CCE. Neuro/Psych: No gross deficits.  Moving all 4 limbs.  Appropriate conversation.  Intake/Output from previous day: 08/14 0701 - 08/15 0700 In: 1296.5 [I.V.:507.6; IV Piggyback:788.8] Out: 950 [Urine:950]  Intake/Output this shift: Total I/O In: -  Out: 325 [Urine:325]  Lab Results: Recent Labs    04/24/22 1444 04/24/22 2059 04/25/22 0641  WBC 10.3 10.9* 8.7  HGB 8.0* 8.1* 6.5*  HCT 24.2* 24.2* 19.9*  PLT 240 281 227   BMET Recent Labs    04/23/22 0047 04/24/22 0120 04/25/22 0641  NA 141 143 142  K 4.4 3.4* 3.2*  CL 110 111 111  CO2 '23 23 23  '$ GLUCOSE 96 128* 97  BUN 31* 21 12  CREATININE 1.43* 1.34* 1.30*  CALCIUM 8.1* 8.4* 8.0*   LFT No results for input(s): "PROT", "ALBUMIN", "AST", "ALT", "ALKPHOS", "BILITOT", "BILIDIR", "IBILI" in the last 72 hours. PT/INR No results for input(s): "LABPROT", "INR" in the last 72 hours. Hepatitis Panel No results for input(s): "HEPBSAG", "HCVAB", "HEPAIGM", "HEPBIGM" in the last 72 hours.  Studies/Results: CT ANGIO GI BLEED  Result Date: 04/24/2022 CLINICAL DATA:  Rectal bleeding, lower GI bleed EXAM: CTA ABDOMEN AND PELVIS WITHOUT AND WITH CONTRAST TECHNIQUE:  Multidetector CT imaging of the abdomen and pelvis was performed using the standard protocol during bolus administration of intravenous contrast. Multiplanar reconstructed images and MIPs were obtained and reviewed to evaluate the vascular anatomy. RADIATION DOSE REDUCTION: This exam was performed according to the departmental dose-optimization program which includes automated exposure control, adjustment of the mA and/or kV according to patient size and/or use of iterative reconstruction technique. CONTRAST:  139m OMNIPAQUE IOHEXOL 350 MG/ML SOLN COMPARISON:  04/21/2022 FINDINGS: VASCULAR Normal contour and caliber of the abdominal aorta. No evidence of aneurysm, dissection, or other acute aortic pathology. Variant anatomy of the celiac axis, with direct origin of the common hepatic, splenic, and left gastric arteries from the anterior aorta. Solitary bilateral renal arteries. Moderate mixed calcific atherosclerosis with atherosclerosis at the branch vessel origins, however without high-grade stenosis. Review of the MIP images confirms the above findings. NON-VASCULAR Lower chest: Dependent bibasilar scarring and or atelectasis. Small hiatal hernia. Hepatobiliary: No focal liver abnormality is seen. Status post cholecystectomy. No biliary dilatation. Pancreas: Unremarkable. No pancreatic ductal dilatation or surrounding inflammatory changes. Spleen: Normal in size without significant abnormality. Adrenals/Urinary Tract: Adrenal glands are unremarkable. Nonobstructive calculus of the midportion of the right kidney. No left-sided calculi, ureteral calculi, or hydronephrosis. Dense metallic streak artifact in the low pelvis secondary to adjacent bilateral hip arthroplasty. Within this limitation, bladder is unremarkable. Stomach/Bowel: Stomach is within normal limits. Appendix is not clearly visualized. No evidence of bowel wall thickening, distention, or inflammatory  changes. Descending and sigmoid diverticulosis.  No evidence of intraluminal contrast extravasation or other findings to localize GI bleeding. Lymphatic: No enlarged abdominal or pelvic lymph nodes. Reproductive: No mass or other significant abnormality. Other: No abdominal wall hernia.  Anasarca.  No ascites. Musculoskeletal: No acute or significant osseous findings. 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 IMPRESSION: 1. No evidence of intraluminal contrast extravasation or other findings to localize GI bleeding. 2. Descending and sigmoid diverticulosis without evidence of acute diverticulitis. 3. Normal contour and caliber of the abdominal aorta. No evidence of aneurysm dissection, or other acute aortic pathology. 4. Nonobstructive right nephrolithiasis. Aortic Atherosclerosis (ICD10-I70.0). Electronically Signed   By: Delanna Ahmadi M.D.   On: 04/24/2022 16:52    ASSESMENT:   Painless hematochezia.  CTA 8/11 with no active bleeding.  04/24/2022 colonoscopy with poor prep.  Blood throughout examined colon.  Did see nonbleeding internal hemorrhoids.  Sigmoid and descending colon diverticulosis.  8 mm cecal polyp and an additional smaller polyp in the distal colon.  Polyps not removed given unexplained GI bleeding.  04/24/2022 CT angio/GI bleed study #2, again no active bleeding.  Diverticulosis in descending and sigmoid colon.  Despite completing a second bowel prep overnight, the stools were still bloody early this morning so colonoscopy rescheduled for tomorrow at 8:30 AM.  Hypokalemia.  Blood loss anemia.  Hb12.1..  8.1..  6.5.  Third unit of blood currently transfusing.   PLAN   Colonoscopy rescheduled for 830 tomorrow morning.  See repeat orders for bowel prep.    Azucena Freed  04/25/2022, 11:26 AM Phone [phone]

## 2022-04-25 NOTE — Progress Notes (Signed)
[facility] night cross cover note:   I was notified by RN that this patient appears more confused this evening, noted to be hallucinating, attempting to get out of bed,  pulling at lines.  Verbal redirection intermittently helpful, prompting my order for one-to-one sitter.  However, verbal redirection subsequently less efficacious.   I then placed order for Haldol 5 mg IV prn x1 dose for agitation.    Babs Bertin, DO Hospitalist

## 2022-04-25 NOTE — Progress Notes (Signed)
Pt surgery pushed to 08/16, will be on bowel prep tonight and will not wear his CPAP. Pt knows to contact RT if he wants to sleep and wear his machine.

## 2022-04-26 ENCOUNTER — Inpatient Hospital Stay (HOSPITAL_COMMUNITY): Payer: Medicare HMO | Admitting: Anesthesiology

## 2022-04-26 ENCOUNTER — Encounter (HOSPITAL_COMMUNITY): Payer: Self-pay | Admitting: Internal Medicine

## 2022-04-26 ENCOUNTER — Encounter (HOSPITAL_COMMUNITY)
Admission: EM | Disposition: A | Discharge status: Home Health | Payer: Self-pay | Source: Home / Self Care | Attending: Internal Medicine

## 2022-04-26 DIAGNOSIS — Z87891 Personal history of nicotine dependence: Secondary | ICD-10-CM

## 2022-04-26 DIAGNOSIS — K922 Gastrointestinal hemorrhage, unspecified: Secondary | ICD-10-CM | POA: Diagnosis not present

## 2022-04-26 DIAGNOSIS — G9341 Metabolic encephalopathy: Secondary | ICD-10-CM | POA: Diagnosis not present

## 2022-04-26 DIAGNOSIS — R578 Other shock: Secondary | ICD-10-CM | POA: Diagnosis not present

## 2022-04-26 DIAGNOSIS — D122 Benign neoplasm of ascending colon: Secondary | ICD-10-CM

## 2022-04-26 DIAGNOSIS — D126 Benign neoplasm of colon, unspecified: Secondary | ICD-10-CM | POA: Diagnosis not present

## 2022-04-26 DIAGNOSIS — J449 Chronic obstructive pulmonary disease, unspecified: Secondary | ICD-10-CM

## 2022-04-26 DIAGNOSIS — K5731 Diverticulosis of large intestine without perforation or abscess with bleeding: Secondary | ICD-10-CM | POA: Diagnosis not present

## 2022-04-26 DIAGNOSIS — K648 Other hemorrhoids: Secondary | ICD-10-CM | POA: Diagnosis not present

## 2022-04-26 DIAGNOSIS — K635 Polyp of colon: Secondary | ICD-10-CM | POA: Diagnosis not present

## 2022-04-26 DIAGNOSIS — D62 Acute posthemorrhagic anemia: Secondary | ICD-10-CM | POA: Diagnosis not present

## 2022-04-26 HISTORY — PX: POLYPECTOMY: SHX5525

## 2022-04-26 HISTORY — PX: COLONOSCOPY WITH PROPOFOL: SHX5780

## 2022-04-26 LAB — TYPE AND SCREEN
ABO/RH(D): O POS
Antibody Screen: NEGATIVE
Unit division: 0
Unit division: 0
Unit division: 0

## 2022-04-26 LAB — BASIC METABOLIC PANEL
Anion gap: 8 (ref 5–15)
BUN: 9 mg/dL (ref 8–23)
CO2: 22 mmol/L (ref 22–32)
Calcium: 7.9 mg/dL — ABNORMAL LOW (ref 8.9–10.3)
Chloride: 113 mmol/L — ABNORMAL HIGH (ref 98–111)
Creatinine, Ser: 1.2 mg/dL (ref 0.61–1.24)
GFR, Estimated: >60 mL/min (ref >60)
Glucose, Bld: 85 mg/dL (ref 70–99)
Potassium: 3.3 mmol/L — ABNORMAL LOW (ref 3.5–5.1)
Sodium: 143 mmol/L (ref 135–145)

## 2022-04-26 LAB — BPAM RBC
Blood Product Expiration Date: 202309092359
Blood Product Expiration Date: 202309092359
Blood Product Expiration Date: 202309142359
ISSUE DATE / TIME: 202308150955
ISSUE DATE / TIME: 202308151157
ISSUE DATE / TIME: 202308151420
Unit Type and Rh: 5100
Unit Type and Rh: 5100
Unit Type and Rh: 5100

## 2022-04-26 LAB — CBC
HCT: 27.8 % — ABNORMAL LOW (ref 39.0–52.0)
Hemoglobin: 9.4 g/dL — ABNORMAL LOW (ref 13.0–17.0)
MCH: 30.5 pg (ref 26.0–34.0)
MCHC: 33.8 g/dL (ref 30.0–36.0)
MCV: 90.3 fL (ref 80.0–100.0)
Platelets: 224 10*3/uL (ref 150–400)
RBC: 3.08 MIL/uL — ABNORMAL LOW (ref 4.22–5.81)
RDW: 17.9 % — ABNORMAL HIGH (ref 11.5–15.5)
WBC: 7.7 10*3/uL (ref 4.0–10.5)
nRBC: 0 % (ref 0.0–0.2)

## 2022-04-26 LAB — MAGNESIUM: Magnesium: 2.1 mg/dL (ref 1.7–2.4)

## 2022-04-26 LAB — PHOSPHORUS: Phosphorus: 3.5 mg/dL (ref 2.5–4.6)

## 2022-04-26 SURGERY — COLONOSCOPY WITH PROPOFOL
Anesthesia: Monitor Anesthesia Care

## 2022-04-26 MED ORDER — POTASSIUM CHLORIDE CRYS ER 20 MEQ PO TBCR
20.0000 meq | EXTENDED_RELEASE_TABLET | Freq: Two times a day (BID) | ORAL | Status: DC
  Administered 2022-04-26 – 2022-04-27 (×3): 20 meq via ORAL
  Filled 2022-04-26 (×3): qty 1

## 2022-04-26 MED ORDER — FENTANYL CITRATE (PF) 100 MCG/2ML IJ SOLN
INTRAMUSCULAR | Status: AC
  Filled 2022-04-26: qty 2

## 2022-04-26 MED ORDER — SODIUM CHLORIDE 0.9 % IV SOLN: INTRAVENOUS | Status: DC

## 2022-04-26 MED ORDER — GLYCOPYRROLATE PF 0.2 MG/ML IJ SOSY
PREFILLED_SYRINGE | INTRAMUSCULAR | Status: DC | PRN
  Administered 2022-04-26: .1 mg via INTRAVENOUS

## 2022-04-26 MED ORDER — LACTATED RINGERS IV SOLN: INTRAVENOUS | Status: DC | PRN

## 2022-04-26 MED ORDER — FENTANYL CITRATE (PF) 100 MCG/2ML IJ SOLN
INTRAMUSCULAR | Status: DC | PRN
  Administered 2022-04-26 (×2): 50 ug via INTRAVENOUS

## 2022-04-26 MED ORDER — PROPOFOL 500 MG/50ML IV EMUL
INTRAVENOUS | Status: DC | PRN
  Administered 2022-04-26: 100 ug/kg/min via INTRAVENOUS

## 2022-04-26 MED ORDER — TRAMADOL HCL 50 MG PO TABS
50.0000 mg | ORAL_TABLET | Freq: Four times a day (QID) | ORAL | Status: DC
  Administered 2022-04-26 – 2022-04-27 (×2): 50 mg via ORAL
  Filled 2022-04-26 (×2): qty 1

## 2022-04-26 SURGICAL SUPPLY — 22 items

## 2022-04-26 NOTE — Care Management Important Message (Signed)
Important Message  Patient Details  Name: [name] Pratt MRN: [id number] Date of Birth: 02-10-1941   Medicare Important Message Given:  Yes     Orbie Pyo 04/26/2022, 2:07 PM

## 2022-04-26 NOTE — Progress Notes (Signed)
Physical Therapy Treatment Patient Details Name: [name] Pratt MRN: [id number] DOB: 05/13/1941 Today's Date: 04/26/2022   History of Present Illness Pt is a 81 y/o male presenting on 8/11 with rectal bleeding. Found with hemorrhagic shock with syncope, anemia due to GI bleed.  Colonoscopy 8/14. PMH inculdes: anemia, arthritis, skin CA, HTN, cervical myelopathy, COPD, cranial nerve IV palsy, DJD, B knee replacement, vertigo.    PT Comments    Pt eager to get up and mobilize in hallway, secondary to R groin pain which pt describes as chronic. Pt fairly unsteady during gait with cane today, requiring PT assist to steady pt on multiple occasions. Pt states his wife will be home to assist him. PT recommending HHPT to address activity tolerance, balance, and strength deficits post-acutely.     Recommendations for follow up therapy are one component of a multi-disciplinary discharge planning process, led by the attending physician.  Recommendations may be updated based on patient status, additional functional criteria and insurance authorization.  Follow Up Recommendations  Home health PT     Assistance Recommended at Discharge Set up Supervision/Assistance  Patient can return home with the following A little help with bathing/dressing/bathroom;Assistance with cooking/housework;Assist for transportation   Equipment Recommendations  None recommended by PT    Recommendations for Other Services       Precautions / Restrictions Precautions Precautions: Fall Restrictions Weight Bearing Restrictions: No     Mobility  Bed Mobility               General bed mobility comments: up in chair    Transfers Overall transfer level: Needs assistance Equipment used: Straight cane Transfers: Sit to/from Stand Sit to Stand: Min guard           General transfer comment: for safety, pt needing PT to pass him his cane once standing    Ambulation/Gait Ambulation/Gait assistance:  Min guard, Min assist Gait Distance (Feet): 130 Feet Assistive device: Straight cane Gait Pattern/deviations: Step-through pattern, Decreased stride length, Drifts right/left, Scissoring Gait velocity: decr     General Gait Details: min guard for safety, frequent physical assist for steadying and correcting imbalance with x1 episode of scissoring when attempting to go around an object.   Stairs             Wheelchair Mobility    Modified Rankin (Stroke Patients Only)       Balance Overall balance assessment: Needs assistance Sitting-balance support: No upper extremity supported, Feet supported Sitting balance-Leahy Scale: Fair     Standing balance support: Single extremity supported, No upper extremity supported, During functional activity Standing balance-Leahy Scale: Poor Standing balance comment: reliant on device and external assist from PT                            Cognition Arousal/Alertness: Awake/alert Behavior During Therapy: WFL for tasks assessed/performed Overall Cognitive Status: Impaired/Different from baseline Area of Impairment: Safety/judgement                         Safety/Judgement: Decreased awareness of safety, Decreased awareness of deficits              Exercises      General Comments        Pertinent Vitals/Pain Pain Assessment Pain Assessment: 0-10 Pain Score: 5  Pain Location: R groin Pain Descriptors / Indicators: Sore, Discomfort Pain Intervention(s): Monitored during session, Limited activity within patient's tolerance,  Repositioned, Patient requesting pain meds-RN notified    Home Living                          Prior Function            PT Goals (current goals can now be found in the care plan section) Acute Rehab PT Goals Patient Stated Goal: home independent PT Goal Formulation: With patient Time For Goal Achievement: 05/08/22 Potential to Achieve Goals: Good Progress  towards PT goals: Progressing toward goals    Frequency    Min 3X/week      PT Plan Current plan remains appropriate    Co-evaluation              AM-PAC PT "6 Clicks" Mobility   Outcome Measure  Help needed turning from your back to your side while in a flat bed without using bedrails?: A Little Help needed moving from lying on your back to sitting on the side of a flat bed without using bedrails?: A Little Help needed moving to and from a bed to a chair (including a wheelchair)?: A Little Help needed standing up from a chair using your arms (e.g., wheelchair or bedside chair)?: A Little Help needed to walk in hospital room?: A Little Help needed climbing 3-5 steps with a railing? : A Lot 6 Click Score: 17    End of Session   Activity Tolerance: Patient tolerated treatment well;Patient limited by fatigue Patient left: in chair;with call bell/phone within reach;with chair alarm set Nurse Communication: Mobility status PT Visit Diagnosis: Unsteadiness on feet (R26.81)     Time: 5456-2563 PT Time Calculation (min) (ACUTE ONLY): 15 min  Charges:  $Gait Training: 8-22 mins                    Stacie Glaze, PT DPT Acute Rehabilitation Services Pager [phone]  Office [phone]    Roxine Caddy E Ruffin Pyo 04/26/2022, 4:25 PM

## 2022-04-26 NOTE — Anesthesia Postprocedure Evaluation (Signed)
Anesthesia Post Note  Patient: [name] Pratt  Procedure(s) Performed: COLONOSCOPY WITH PROPOFOL POLYPECTOMY     Patient location during evaluation: PACU Anesthesia Type: MAC Level of consciousness: awake and alert and oriented Pain management: pain level controlled Vital Signs Assessment: post-procedure vital signs reviewed and stable Respiratory status: spontaneous breathing, nonlabored ventilation and respiratory function stable Cardiovascular status: stable and blood pressure returned to baseline Postop Assessment: no apparent nausea or vomiting Anesthetic complications: no Comments: Patient has developed right groin pain prior to procedure and has some right flank pain. Suspect he may have ureteral calculus as he has a Hx/o of nephrolithiasis. Given fentanyl IV for pain.   No notable events documented.  Last Vitals:  Vitals:   04/26/22 0943 04/26/22 0947  BP: (!) 157/75   Pulse: (!) 57 60  Resp: 10 14  Temp: 36.4 C   SpO2: 100% 100%    Last Pain:  Vitals:   04/26/22 0947  TempSrc:   PainSc: 5                  [name] A.

## 2022-04-26 NOTE — Op Note (Signed)
[facility] Patient Name: [name] Pratt Procedure Date : 04/26/2022 MRN: [id number] Attending MD: Thornton Park MD, MD Date of Birth: [birth year] CSN: [id number] Age: 81 Admit Type: Inpatient Procedure:                Colonoscopy Indications:              Hematochezia with ongoing bleeding despite CTA,                            attempted colonoscopy earlier was limited due to                            the large amount of bleeding Providers:                Thornton Park MD, MD, Allayne Gitelman, RN, Benetta Spar, Technician Referring MD:              Medicines:                Monitored Anesthesia Care Complications:            No immediate complications. Estimated Blood Loss:     Estimated blood loss was minimal. Procedure:                Pre-Anesthesia Assessment:                           - Prior to the procedure, a History and Physical                            was performed, and patient medications and                            allergies were reviewed. The patient's tolerance of                            previous anesthesia was also reviewed. The risks                            and benefits of the procedure and the sedation                            options and risks were discussed with the patient.                            All questions were answered, and informed consent                            was obtained. Prior Anticoagulants: The patient has                            taken no previous anticoagulant or antiplatelet  agents. ASA Grade Assessment: III - A patient with                            severe systemic disease. After reviewing the risks                            and benefits, the patient was deemed in                            satisfactory condition to undergo the procedure.                           After obtaining informed consent, the colonoscope                            was  passed under direct vision. Throughout the                            procedure, the patient's blood pressure, pulse, and                            oxygen saturations were monitored continuously. The                            [id number]-HQ190L ([id number] Olympus coloscope was                            introduced through the anus and advanced to the 3                            cm into the ileum. A second forward view of the                            right colon was performed. The colonoscopy was                            performed without difficulty. The patient tolerated                            the procedure well. The quality of the bowel                            preparation was excellent. Scope In: 9:07:49 AM Scope Out: 9:33:00 AM Scope Withdrawal Time: 0 hours 20 minutes 48 seconds  Total Procedure Duration: 0 hours 25 minutes 11 seconds  Findings:      The perianal and digital rectal examinations were normal.      Non-bleeding internal hemorrhoids were found.      Multiple small and large-mouthed diverticula were found in the entire       colon. The diverticulosis is most dense in the left colon.      Three sessile polyps were found in the hepatic flexure. The polyps were       3 to 4 mm in size. These polyps were removed with a cold  snare.       Resection and retrieval were complete. Estimated blood loss was minimal.      A 8-10 mm polyp was found in the cecum. The polyp was sessile. The polyp       was removed with a cold snare. Resection and retrieval were complete.       Estimated blood loss was minimal.      The exam was otherwise without abnormality on direct and retroflexion       views. There was no active bleeding. Impression:               - Non-bleeding internal hemorrhoids.                           - Diverticulosis in the entire examined colon. I                            suspect his recent GI bleed was diverticular in                            source.                            - Three 3 to 4 mm polyps at the hepatic flexure,                            removed with a cold snare. Resected and retrieved.                           - One 8-10 mm polyp in the cecum, removed with a                            cold snare. Resected and retrieved. Recommendation:           - Return patient to hospital ward for ongoing care.                           - Continue present medications.                           - Await pathology results.                           - Repeat colonoscopy is not recommended for                            surveillance given his age.                           - Follow a high fiber diet. Drink at least 64                            ounces of water daily. Add a daily stool bulking                            agent such as psyllium (an exampled would  be                            Metamucil).                           - Emerging evidence supports eating a diet of                            fruits, vegetables, grains, calcium, and yogurt                            while reducing red meat and alcohol may reduce the                            risk of colon cancer.                           The additional inpatient GI evaluation planned at                            this time. I updated the patient's wife of the                            results and recommendations by phone and reviewed                            the findings with the patient in the PACU after the                            procedure. Procedure Code(s):        --- Professional ---                           [phone], Colonoscopy, flexible; with removal of                            tumor(s), polyp(s), or other lesion(s) by snare                            technique Diagnosis Code(s):        --- Professional ---                           A19.3, Other hemorrhoids                           K63.5, Polyp of colon                           K92.1, Melena (includes Hematochezia)                            K57.30, Diverticulosis of large intestine without                            perforation or  abscess without bleeding CPT copyright 2019 [employer]. All rights reserved. The codes documented in this report are preliminary and upon coder review may  be revised to meet current compliance requirements. Thornton Park MD, MD 04/26/2022 9:53:09 AM This report has been signed electronically. Number of Addenda: 0

## 2022-04-26 NOTE — Progress Notes (Signed)
OT Cancellation Note  Patient Details Name: [name] Pratt MRN: [id number] DOB: May 25, 1941   Cancelled Treatment:    Reason Eval/Treat Not Completed: Patient at procedure or test/ unavailable (Patient off unit due to colonoscopy) Lodema Hong, [place]  Office [place] 04/26/2022, 8:34 AM

## 2022-04-26 NOTE — Progress Notes (Signed)
Occupational Therapy Treatment Patient Details Name: [name] Pratt MRN: [id number] DOB: [birth year] Today's Date: 04/26/2022   History of present illness Pt is a 81 y/o male presenting on 8/11 with rectal bleeding. Found with hemorrhagic shock with syncope, anemia due to GI bleed.  Colonoscopy 8/14. PMH inculdes: anemia, arthritis, skin CA, HTN, cervical myelopathy, COPD, cranial nerve IV palsy, DJD, B knee replacement, vertigo.   OT comments  Patient received in supine and agreeable to OT session. Patient able to get to EOB without assistance and stood at bedside to use urinal with min guard assist. Patient ambulated to sink with cane and performed grooming with min guard assist. Patient had seated rest break following grooming and asked to walk in hallway with cane. Patient was min guard assist to perform.  Patient making good progress and will continue to be followed by acute OT.    Recommendations for follow up therapy are one component of a multi-disciplinary discharge planning process, led by the attending physician.  Recommendations may be updated based on patient status, additional functional criteria and insurance authorization.    Follow Up Recommendations  Home health OT (vs no OT follow up, depends on progress)    Assistance Recommended at Discharge Intermittent Supervision/Assistance  Patient can return home with the following  A little help with walking and/or transfers;A lot of help with bathing/dressing/bathroom;Assist for transportation;Help with stairs or ramp for entrance;Assistance with cooking/housework   Equipment Recommendations  BSC/3in1    Recommendations for Other Services      Precautions / Restrictions Precautions Precautions: Fall Restrictions Weight Bearing Restrictions: No       Mobility Bed Mobility Overal bed mobility: Modified Independent             General bed mobility comments: able to get to EOB without assistance     Transfers Overall transfer level: Needs assistance Equipment used: Straight cane Transfers: Sit to/from Stand, Bed to chair/wheelchair/BSC Sit to Stand: Min guard           General transfer comment: min guard assist to stand and for transfers, verbal cues for safety     Balance Overall balance assessment: Needs assistance Sitting-balance support: No upper extremity supported, Feet supported Sitting balance-Leahy Scale: Fair     Standing balance support: Single extremity supported, No upper extremity supported, During functional activity Standing balance-Leahy Scale: Fair Standing balance comment: able to stand at sink to perform grooming and performed mobility with cane                           ADL either performed or assessed with clinical judgement   ADL Overall ADL's : Needs assistance/impaired     Grooming: Wash/dry hands;Wash/dry face;Oral care;Min guard;Standing Grooming Details (indicate cue type and reason): standing at sink         Upper Body Dressing : Minimal assistance;Standing Upper Body Dressing Details (indicate cue type and reason): gown to cover back                   General ADL Comments: patient stood at bed side to use urinal with min guard assist    Extremity/Trunk Assessment              Vision       Perception     Praxis      Cognition Arousal/Alertness: Awake/alert Behavior During Therapy: WFL for tasks assessed/performed Overall Cognitive Status: Within Functional Limits for tasks assessed  General Comments: very pleasant and impulsive at times requiring verbal cues to have assistance to stand        Exercises      Shoulder Instructions       General Comments      Pertinent Vitals/ Pain       Pain Assessment Pain Assessment: No/denies pain  Home Living                                          Prior Functioning/Environment               Frequency  Min 2X/week        Progress Toward Goals  OT Goals(current goals can now be found in the care plan section)  Progress towards OT goals: Progressing toward goals  Acute Rehab OT Goals Patient Stated Goal: go home OT Goal Formulation: With patient Time For Goal Achievement: 05/08/22 Potential to Achieve Goals: Good ADL Goals Pt Will Perform Grooming: Independently;standing Pt Will Perform Lower Body Dressing: with modified independence;sit to/from stand Pt Will Transfer to Toilet: with modified independence;ambulating Pt Will Perform Toileting - Clothing Manipulation and hygiene: with modified independence;sit to/from stand Pt Will Perform Tub/Shower Transfer: Shower transfer;ambulating;rolling walker;shower seat;grab bars Pt/caregiver will Perform Home Exercise Program: Increased strength;Both right and left upper extremity;With written HEP provided  Plan Discharge plan remains appropriate    Co-evaluation                 AM-PAC OT "6 Clicks" Daily Activity     Outcome Measure   Help from another person eating meals?: A Little Help from another person taking care of personal grooming?: A Little Help from another person toileting, which includes using toliet, bedpan, or urinal?: Total Help from another person bathing (including washing, rinsing, drying)?: A Lot Help from another person to put on and taking off regular upper body clothing?: A Little Help from another person to put on and taking off regular lower body clothing?: A Little 6 Click Score: 15    End of Session Equipment Utilized During Treatment: Other (comment) (straight cane)  OT Visit Diagnosis: Other abnormalities of gait and mobility (R26.89);Muscle weakness (generalized) (M62.81)   Activity Tolerance Patient tolerated treatment well   Patient Left in chair;with call bell/phone within reach;with chair alarm set   Nurse Communication Mobility status        Time:  7408-1448 OT Time Calculation (min): 29 min  Charges: OT General Charges $OT Visit: 1 Visit OT Treatments $Self Care/Home Management : 8-22 mins $Therapeutic Activity: 8-22 mins  Lodema Hong, [place]  Office [phone]   Trixie Dredge 04/26/2022, 2:26 PM

## 2022-04-26 NOTE — Progress Notes (Signed)
PROGRESS NOTE    [name] Pratt  JSH:[id number] DOB: [birth year] DOA: 04/21/2022 PCP: [name], NP    Brief Narrative:  PMH of HTN, HLD, COPD, chronic constipation and hemorrhoids presented to the hospital with complaints of rectal bleeding found to have acute blood loss anemia and hemorrhagic shock. CT angio x2 is not able to show any evidence of acute bleeding. Initial plan was conservative management although due to ongoing bleeding, underwent colonoscopy on 8/14.  It showed significant amount of fresh blood in the colon without any evidence of acute bleeding. Repeat colonoscopy 8/16 with multiple diverticulosis, stopped bleeding. Received multiple blood transfusions.   Assessment & Plan:   Hemorrhagic shock with syncope, acute blood loss anemia due to lower GI bleeding.  Diverticular bleed. Initially required blood transfusion as well as vasopressors. Stabilized and transferred to medical floor. Hemoglobin continued to drop.  8/15 it was 6.5-additional 3 units of PRBC ordered with appropriate response. CT angiogram x2 with no source of bleeding. Colonoscopy 8/14, active bleeding unable to identify source Colonoscopy 8/16, resolved bleeding, multiple diverticuli. Continue to monitor hemoglobin.  Recheck tomorrow morning.  Likely self controlled bleeding. Allow regular diet today, mobilize.  Can come off telemetry.  Acute metabolic encephalopathy: Likely secondary to active bleeding and hypoperfusion.  Improved.  Sleep apnea, on CPAP.  Not using at home.  Acute kidney injury: Resolved.  Hypothyroidism: On Synthroid.  Hypokalemia: Replace potassium.  Aspiration pneumonia: Suspected aspiration pneumonia, currently without pulmonary symptoms.  Mentation has improved.  Discontinue antibiotics.  All-time aspiration precautions.  Incentive spirometry.  Mobility.   DVT prophylaxis: SCDs Start: 04/21/22 1602   Code Status: Full code Family Communication:  None Disposition Plan: Status is: Inpatient Remains inpatient appropriate because: Inpatient procedure planned, challenge with regular diet.  Monitor hemoglobin.     Consultants:  Critical care Gastroenterology  Procedures:  Multiple as above  Antimicrobials:  Unasyn, discontinued   Subjective: Patient back from procedure today.  Did have aggravation of back pain with sciatica after the procedure.  Currently feels better.  He has been following up with neurosurgery for his lower back pain and had recently injections on lower back.  Wants to mobilize around.  Denies any nausea or vomiting.  Objective: Vitals:   04/26/22 1002 04/26/22 1012 04/26/22 1018 04/26/22 1019  BP: (!) 181/81 (!) 192/80 (!) 181/74   Pulse: 62 (!) 52 (!) 50 (!) 54  Resp: 13 10 (!) 9 10  Temp:      TempSrc:      SpO2: 99% 97% 97% 99%  Weight:      Height:        Intake/Output Summary (Last 24 hours) at 04/26/2022 1450 Last data filed at 04/26/2022 8502 Gross per 24 hour  Intake 1594.85 ml  Output 350 ml  Net 1244.85 ml   Filed Weights   04/21/22 1354 04/23/22 0500 04/26/22 0753  Weight: 66.4 kg 67.4 kg 67.4 kg    Examination:  General exam: Appears calm and comfortable  Not in any distress.  On room air. Respiratory system: Clear to auscultation. Respiratory effort normal. Cardiovascular system: S1 & S2 heard, RRR. No JVD, murmurs, rubs, gallops or clicks. No pedal edema. Gastrointestinal system: Soft.  Bowel sound present.   Central nervous system: Alert and oriented. No focal neurological deficits. Extremities: Symmetric 5 x 5 power.    Data Reviewed: I have personally reviewed following labs and imaging studies  CBC: Recent Labs  Lab 04/21/22 0043 04/21/22 0950 04/21/22 1630 04/24/22 1444  04/24/22 2059 04/25/22 0641 04/25/22 1834 04/26/22 0430  WBC 8.6 8.2   < > 10.3 10.9* 8.7 13.0* 7.7  NEUTROABS 5.4 5.5  --   --   --   --   --   --   HGB 12.1* 10.0*   < > 8.0* 8.1* 6.5*  10.9* 9.4*  HCT 36.0* 29.8*   < > 24.2* 24.2* 19.9* 32.0* 27.8*  MCV 97.3 98.7   < > 92.7 93.4 93.9 88.4 90.3  PLT 338 301   < > 240 281 227 245 224   < > = values in this interval not displayed.   Basic Metabolic Panel: Recent Labs  Lab 04/22/22 0604 04/23/22 0047 04/24/22 0120 04/25/22 0641 04/26/22 0430  NA 142 141 143 142 143  K 4.4 4.4 3.4* 3.2* 3.3*  CL 110 110 111 111 113*  CO2 '24 23 23 23 22  '$ GLUCOSE 118* 96 128* 97 85  BUN 35* 31* '21 12 9  '$ CREATININE 1.46* 1.43* 1.34* 1.30* 1.20  CALCIUM 8.3* 8.1* 8.4* 8.0* 7.9*  MG 2.1  --  1.8 1.9 2.1  PHOS  --   --   --   --  3.5   GFR: Estimated Creatinine Clearance: 42.8 mL/min (by C-G formula based on SCr of 1.2 mg/dL). Liver Function Tests: Recent Labs  Lab 04/21/22 0043  AST 34  ALT 29  ALKPHOS 70  BILITOT 0.5  PROT 7.5  ALBUMIN 3.8   No results for input(s): "LIPASE", "AMYLASE" in the last 168 hours. No results for input(s): "AMMONIA" in the last 168 hours. Coagulation Profile: Recent Labs  Lab 04/21/22 0043  INR 1.0   Cardiac Enzymes: No results for input(s): "CKTOTAL", "CKMB", "CKMBINDEX", "TROPONINI" in the last 168 hours. BNP (last 3 results) No results for input(s): "PROBNP" in the last 8760 hours. HbA1C: No results for input(s): "HGBA1C" in the last 72 hours. CBG: Recent Labs  Lab 04/22/22 0325 04/23/22 1948 04/24/22 0021 04/24/22 0348 04/24/22 1132  GLUCAP 138* 122* 121* 101* 112*   Lipid Profile: No results for input(s): "CHOL", "HDL", "LDLCALC", "TRIG", "CHOLHDL", "LDLDIRECT" in the last 72 hours. Thyroid Function Tests: No results for input(s): "TSH", "T4TOTAL", "FREET4", "T3FREE", "THYROIDAB" in the last 72 hours. Anemia Panel: No results for input(s): "VITAMINB12", "FOLATE", "FERRITIN", "TIBC", "IRON", "RETICCTPCT" in the last 72 hours. Sepsis Labs: Recent Labs  Lab 04/22/22 0604  LATICACIDVEN 1.1    Recent Results (from the past 240 hour(s))  MRSA Next Gen by PCR, Nasal      Status: None   Collection Time: 04/21/22  1:45 PM   Specimen: Nasal Mucosa; Nasal Swab  Result Value Ref Range Status   MRSA by PCR Next Gen NOT DETECTED NOT DETECTED Final    Comment: (NOTE) The GeneXpert MRSA Assay (FDA approved for NASAL specimens only), is one component of a comprehensive MRSA colonization surveillance program. It is not intended to diagnose MRSA infection nor to guide or monitor treatment for MRSA infections. Test performance is not FDA approved in patients less than 93 years old. Performed at [facility], [place] [address] [place], [place] [address]          Radiology Studies: CT ANGIO GI BLEED  Result Date: 04/24/2022 CLINICAL DATA:  Rectal bleeding, lower GI bleed EXAM: CTA ABDOMEN AND PELVIS WITHOUT AND WITH CONTRAST TECHNIQUE: Multidetector CT imaging of the abdomen and pelvis was performed using the standard protocol during bolus administration of intravenous contrast. Multiplanar reconstructed images and MIPs were  obtained and reviewed to evaluate the vascular anatomy. RADIATION DOSE REDUCTION: This exam was performed according to the departmental dose-optimization program which includes automated exposure control, adjustment of the mA and/or kV according to patient size and/or use of iterative reconstruction technique. CONTRAST:  153m OMNIPAQUE IOHEXOL 350 MG/ML SOLN COMPARISON:  04/21/2022 FINDINGS: VASCULAR Normal contour and caliber of the abdominal aorta. No evidence of aneurysm, dissection, or other acute aortic pathology. Variant anatomy of the celiac axis, with direct origin of the common hepatic, splenic, and left gastric arteries from the anterior aorta. Solitary bilateral renal arteries. Moderate mixed calcific atherosclerosis with atherosclerosis at the branch vessel origins, however without high-grade stenosis. Review of the MIP images confirms the above findings. NON-VASCULAR Lower chest: Dependent bibasilar scarring and or atelectasis. Small  hiatal hernia. Hepatobiliary: No focal liver abnormality is seen. Status post cholecystectomy. No biliary dilatation. Pancreas: Unremarkable. No pancreatic ductal dilatation or surrounding inflammatory changes. Spleen: Normal in size without significant abnormality. Adrenals/Urinary Tract: Adrenal glands are unremarkable. Nonobstructive calculus of the midportion of the right kidney. No left-sided calculi, ureteral calculi, or hydronephrosis. Dense metallic streak artifact in the low pelvis secondary to adjacent bilateral hip arthroplasty. Within this limitation, bladder is unremarkable. Stomach/Bowel: Stomach is within normal limits. Appendix is not clearly visualized. No evidence of bowel wall thickening, distention, or inflammatory changes. Descending and sigmoid diverticulosis. No evidence of intraluminal contrast extravasation or other findings to localize GI bleeding. Lymphatic: No enlarged abdominal or pelvic lymph nodes. Reproductive: No mass or other significant abnormality. Other: No abdominal wall hernia.  Anasarca.  No ascites. Musculoskeletal: No acute or significant osseous findings. 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 IMPRESSION: 1. No evidence of intraluminal contrast extravasation or other findings to localize GI bleeding. 2. Descending and sigmoid diverticulosis without evidence of acute diverticulitis. 3. Normal contour and caliber of the abdominal aorta. No evidence of aneurysm dissection, or other acute aortic pathology. 4. Nonobstructive right nephrolithiasis. Aortic Atherosclerosis (ICD10-I70.0). Electronically Signed   By: ADelanna AhmadiM.D.   On: 04/24/2022 16:52        Scheduled Meds:  acetaminophen  1,000 mg Oral q morning   allopurinol  100 mg Oral BID   Chlorhexidine Gluconate Cloth  6 each Topical Daily   cycloSPORINE  1 drop Both Eyes BID   gabapentin  300 mg Oral BID   hydrALAZINE  10 mg Oral Q8H   ipratropium-albuterol  3  mL Nebulization BID   levothyroxine  150 mcg Oral Once per day on Mon Tue Wed Thu Fri Sat   levothyroxine  75 mcg Oral Once per day on Sun   mirabegron ER  25 mg Oral Daily   potassium chloride  20 mEq Oral BID   rosuvastatin  5 mg Oral Daily   Continuous Infusions:  sodium chloride 10 mL/hr at 04/25/22 2147     LOS: 5 days    Time spent: 35 minutes    KBarb Merino MD [facility] Pager [phone]

## 2022-04-26 NOTE — TOC Initial Note (Signed)
Transition of Care [facility]) - Initial/Assessment Note    Patient Details  Name: [name] Pratt MRN: [id number] Date of Birth: 04/14/1941  Transition of Care [facility]) CM/SW Contact:    Cyndi Bender, RN Phone Number: 04/26/2022, 2:55 PM  Clinical Narrative:                 Spoke to patient regarding transition needs.  Patient lives with spouse. Patient is currently doing OP rehab at [facility] in [place]. Patient has a walker and a cane.  Wife is able to take him to apts if needed.  Address, Phone number and PCP verified.  TOC will continue to follow for needs.  Expected Discharge Plan: OP Rehab Barriers to Discharge: Continued Medical Work up   Patient Goals and CMS Choice Patient states their goals for this hospitalization and ongoing recovery are:: return home      Expected Discharge Plan and Services Expected Discharge Plan: OP Rehab       Living arrangements for the past 2 months: Single Family Home                                      Prior Living Arrangements/Services Living arrangements for the past 2 months: Single Family Home Lives with:: Spouse   Do you feel safe going back to the place where you live?: Yes      Need for Family Participation in Patient Care: Yes (Comment) Care giver support system in place?: Yes (comment)   Criminal Activity/Legal Involvement Pertinent to Current Situation/Hospitalization: No - Comment as needed  Activities of Daily Living Home Assistive Devices/Equipment: None ADL Screening (condition at time of admission) Patient's cognitive ability adequate to safely complete daily activities?: Yes Is the patient deaf or have difficulty hearing?: No Does the patient have difficulty seeing, even when wearing glasses/contacts?: No Does the patient have difficulty concentrating, remembering, or making decisions?: No Patient able to express need for assistance with ADLs?: Yes Does the patient have difficulty dressing or bathing?:  No Independently performs ADLs?: Yes (appropriate for developmental age) Does the patient have difficulty walking or climbing stairs?: Yes Weakness of Legs: None Weakness of Arms/Hands: None  Permission Sought/Granted Permission sought to share information with : Case Manager       Permission granted to share info w AGENCY: OP rehab        Emotional Assessment Appearance:: Appears stated age Attitude/Demeanor/Rapport: Engaged Affect (typically observed): Accepting Orientation: : Oriented to Self, Oriented to Place, Oriented to  Time, Oriented to Situation Alcohol / Substance Use: Not Applicable Psych Involvement: No (comment)  Admission diagnosis:  Rectal bleeding [K62.5] GI bleed [K92.2] Patient Active Problem List   Diagnosis Date Noted   Adenomatous polyp of ascending colon    Adenomatous polyp of colon    Acute metabolic encephalopathy 74/08/8785   Aspiration pneumonia ([place]) 04/24/2022   GI bleed 04/21/2022   Vasovagal syncope    Hemorrhagic shock (HCC)    Symptomatic anemia    Hematochezia    Acute blood loss anemia    Irregular heart beat 02/03/2022   Constipation 02/03/2022   Carpal tunnel syndrome 01/24/2022   Numbness of hand 01/24/2022   Ulnar neuropathy 01/24/2022   Skin lesion 01/16/2022   Hand pain, left 01/16/2022   History of COVID-19 12/05/2021   Benign paroxysmal positional vertigo 09/14/2021   Palpitations 07/25/2021   Cardiac murmur 07/25/2021   History of  chicken pox 07/22/2021   History of hemorrhoids 07/22/2021   Hypertension 07/22/2021   COPD (chronic obstructive pulmonary disease) ([place]) 07/22/2021   History of kidney stones 07/22/2021   History of skin cancer 76/81/1572   Complication of anesthesia 07/22/2021   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[place]) 07/22/2021   Other abnormal glucose 07/22/2021   Spondylitis ([place]) 07/22/2021   Greater trochanteric pain syndrome 07/22/2021   Vertigo 07/08/2021   Hypokalemia 07/08/2021   Trochanteric bursitis  of left hip 06/30/2021   Cancer ([place]) 04/11/2021   Lumbar radiculopathy 03/02/2021   Gait instability 03/02/2021   Hip pain 03/02/2021   Situational depression 03/02/2021   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[place]) 02/09/2021   Urinary retention 11/26/2020   Cervical myelopathy (HCC) 11/24/2020   Body mass index (BMI) 25.0-25.9, adult 11/02/2020   Lumbar spondylosis 11/02/2020   Lumbago of lumbar region with sciatica 10/21/2020   Cystoid macular edema of both eyes 07/28/2020   Status post cervical spinal fusion 07/09/2020   History of total bilateral knee replacement 07/08/2020   Aortic atherosclerosis ([place]) 03/05/2020   Emphysema, unspecified ([place]) 03/05/2020   Arthritis 03/04/2020   Diverticulosis 03/03/2020   Internal hemorrhoids 03/03/2020   Weight loss 03/03/2020   Heme positive stool 02/29/2020   Leg cramps 11/13/2019   Tibialis anterior tendon tear, nontraumatic 11/13/2019   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 07/29/2018   CPAP (continuous positive airway pressure) dependence 03/25/2018   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   Erectile dysfunction 06/20/2017   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 03/06/2017   Peripheral neuropathy 06/19/2016   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   Hearing loss 02/02/2016   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   Allergic rhinitis 02/02/2016   Atypical chest pain 11/24/2015   Preventative health care 11/24/2015   Onychomycosis 06/30/2015   Degenerative disc disease, lumbar 06/30/2015   Other allergic rhinitis 02/18/2015   Deviated nasal septum 02/18/2015   RLS (restless legs syndrome) 02/18/2015   GERD (gastroesophageal reflux disease) 09/18/2014   Hyperglycemia 03/03/2014   Rotator cuff tear 07/27/2013   Sleep apnea with use of continuous positive airway pressure (CPAP) 07/15/2013   Carotid stenosis 02/24/2013    Nonspecific abnormal electrocardiogram (ECG) (EKG) 01/06/2013   DJD (degenerative joint disease) of hip 12/24/2012   Lower GI bleed 12/12/2012   Right groin pain 11/29/2012   Ventral hernia 07/08/2012   High risk medication use 01/18/2012   Dyspnea 06/12/2011   Hyperlipidemia with target LDL less than 130 12/27/2010   Osteoarthrosis, unspecified whether generalized or localized, pelvic region and thigh 07/25/2010   Osteoarthrosis, hand 06/13/2010   Anemia 12/17/2009   Hypothyroidism 05/13/2009   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   Benign essential hypertension 05/13/2009   Right inguinal hernia 05/13/2009   HIATAL HERNIA 05/13/2009   NEPHROLITHIASIS 05/13/2009   ROSACEA 05/13/2009   PCP:  [name], NP Pharmacy:   [facility], [place] [address] Phone: [phone] Fax: [phone]     Social Determinants of Health (SDOH) Interventions    Readmission Risk Interventions     No data to display

## 2022-04-26 NOTE — Interval H&P Note (Signed)
History and Physical Interval Note:  04/26/2022 8:44 AM  [name] Pratt  has presented today for surgery, with the diagnosis of Hematochezia. Unsuccessful attempt at colonoscopy earlier this week with subsequent nondiagnostic CTA.  The various methods of treatment have been discussed with the patient and family. After consideration of risks, benefits and other options for treatment, the patient has consented to  Procedure(s): COLONOSCOPY WITH PROPOFOL (N/A) as a surgical intervention.  The patient's history has been reviewed, patient examined, no change in status, stable for surgery.  I have reviewed the patient's chart and labs.  Questions were answered to the patient's satisfaction.     [place]

## 2022-04-26 NOTE — Transfer of Care (Signed)
Immediate Anesthesia Transfer of Care Note  Patient: [name] Pratt  Procedure(s) Performed: COLONOSCOPY WITH PROPOFOL POLYPECTOMY  Patient Location: PACU  Anesthesia Type:MAC  Level of Consciousness: drowsy  Airway & Oxygen Therapy: Patient Spontanous Breathing and Patient connected to face mask oxygen  Post-op Assessment: Report given to RN and Post -op Vital signs reviewed and stable  Post vital signs: Reviewed and stable  Last Vitals:  Vitals Value Taken Time  BP 157/75 04/26/22 0943  Temp    Pulse 58 04/26/22 0945  Resp 12 04/26/22 0945  SpO2 100 % 04/26/22 0945  Vitals shown include unvalidated device data.  Last Pain:  Vitals:   04/26/22 0753  TempSrc: Temporal  PainSc: 9       Patients Stated Pain Goal: 0 (29/47/65 4650)  Complications: No notable events documented.

## 2022-04-27 ENCOUNTER — Telehealth: Payer: Medicare HMO

## 2022-04-27 ENCOUNTER — Telehealth: Payer: Self-pay

## 2022-04-27 ENCOUNTER — Ambulatory Visit: Payer: Medicare HMO | Admitting: Physical Therapy

## 2022-04-27 DIAGNOSIS — K922 Gastrointestinal hemorrhage, unspecified: Secondary | ICD-10-CM | POA: Diagnosis not present

## 2022-04-27 LAB — CBC
HCT: 28.4 % — ABNORMAL LOW (ref 39.0–52.0)
Hemoglobin: 9.6 g/dL — ABNORMAL LOW (ref 13.0–17.0)
MCH: 30.6 pg (ref 26.0–34.0)
MCHC: 33.8 g/dL (ref 30.0–36.0)
MCV: 90.4 fL (ref 80.0–100.0)
Platelets: 244 10*3/uL (ref 150–400)
RBC: 3.14 MIL/uL — ABNORMAL LOW (ref 4.22–5.81)
RDW: 17.2 % — ABNORMAL HIGH (ref 11.5–15.5)
WBC: 10.3 10*3/uL (ref 4.0–10.5)
nRBC: 0 % (ref 0.0–0.2)

## 2022-04-27 LAB — SURGICAL PATHOLOGY

## 2022-04-27 MED ORDER — DOCUSATE SODIUM 100 MG PO CAPS
100.0000 mg | ORAL_CAPSULE | Freq: Two times a day (BID) | ORAL | 11 refills | Status: DC
Start: 2022-04-27 — End: 2022-12-30

## 2022-04-27 NOTE — Progress Notes (Signed)
Explained discharge summary to patient. Reviewed follow up appointments and next medication administration times. Patient verbalized having an understanding. Removed PIV. All belongings are in the patient's possession. Awaiting transportation home. Not discharge lounge appropriate.

## 2022-04-27 NOTE — Consult Note (Signed)
   [facility] CM Inpatient Consult   04/27/2022  [name] Pratt 04-Nov-1940 [id number]  [facility] [ACO] Patient: [name]  Primary Care Provider:  [name], NP, [place] [facility], is an embedded provider with a Chronic Care Management team and program, and is listed for the transition of care follow up and appointments.  Patient was screened for high risk score for unplanned readmission in an Embedded practice. Reviewed for readmission prevention. No current care coordination noted for post hospital as patient for continuing  OP rehab per inpatient [facility] RNCM notes.  Plan: Provider is listed for follow up with [facility] call for post hospital needs. Patient transitioned home.  Please contact for further questions,  [name], RN BSN [facility] Liaison  [phone] business mobile phone Toll free office [phone]  Fax number: [phone] [name]

## 2022-04-27 NOTE — Discharge Summary (Signed)
Physician Discharge Summary  [name] Pratt WKM:[id number] DOB: 01/13/1941 DOA: 04/21/2022  PCP: [name], NP  Admit date: 04/21/2022 Discharge date: 04/27/2022  Admitted From: Home Disposition: Home, resume outpatient therapies.  Recommendations for Outpatient Follow-up:  Follow up with PCP in 1-2 weeks Please obtain hemoglobin and hematocrit in 1 week. GI will schedule follow-up.   Home Health:N/A  Equipment/Devices:N/A   Discharge Condition:stable   CODE STATUS:Full code  Diet recommendation: low stable.   Discharge Summary: PMH of HTN, HLD, COPD, chronic constipation and hemorrhoids presented to the hospital with complaints of rectal bleeding, found to have acute blood loss anemia and hemorrhagic shock. CT angio x2 is not able to show any evidence of acute bleeding. Initial plan was conservative management although due to ongoing bleeding, underwent colonoscopy on 8/14.  It showed significant amount of fresh blood in the colon without any evidence of acute bleeding. Repeat colonoscopy 8/16 with multiple diverticulosis, stopped bleeding. Received total 5 units of PRBC and stabilized.  Hemoglobin has remained stable last 48 hours with no clinical evidence of ongoing bleeding.  Last hemoglobin 9.5. Please recheck in 1 week to ensure stabilization. GI will schedule follow-up.  Acute metabolic encephalopathy: Likely secondary to active bleeding and hypoperfusion.  Normalized.    Acute kidney injury: Resolved.   Hypothyroidism: On Synthroid.   Hypokalemia: Replaced.  Adequate.   Aspiration pneumonia: Suspected aspiration pneumonia, currently without pulmonary symptoms.  Mentation has improved.  Stable off antibiotics.   Stable for discharge.   Discharge Diagnoses:  Principal Problem:   GI bleed Active Problems:   Hypothyroidism   Benign essential hypertension   COPD (chronic obstructive pulmonary disease) (HCC)   Vasovagal syncope   Hemorrhagic shock  (HCC)   Symptomatic anemia   Hematochezia   Acute blood loss anemia   Acute metabolic encephalopathy   Aspiration pneumonia (HCC)   Adenomatous polyp of ascending colon   Adenomatous polyp of colon    Discharge Instructions  Discharge Instructions     Call MD for:   Complete by: As directed    More than traces of bleeding on your bowel movement   Diet - low sodium heart healthy   Complete by: As directed    Increase activity slowly   Complete by: As directed       Allergies as of 04/27/2022       Reactions   Pravastatin Other (See Comments)   Muscle cramps   Sildenafil Other (See Comments)   Tachycardia   Oxycodone Other (See Comments)   Hallucinations   Atenolol Other (See Comments)   Bradycardia   Elastic Bandages & [zinc] Other (See Comments)   Teflon bandages - Irritation   Metoclopramide Hcl Anxiety        Medication List     STOP taking these medications    Alcohol Pads 70 % Pads   amLODipine 5 MG tablet Commonly known as: NORVASC   ampicillin 500 MG capsule Commonly known as: PRINCIPEN   gabapentin 300 MG capsule Commonly known as: NEURONTIN   meclizine 25 MG tablet Commonly known as: ANTIVERT       TAKE these medications    acetaminophen 650 MG CR tablet Commonly known as: TYLENOL Take 650-1,300 mg by mouth See admin instructions. Take 1,300 mg by mouth in the morning and 650 mg at bedtime   albuterol 108 (90 Base) MCG/ACT inhaler Commonly known as: VENTOLIN HFA INHALE 2 PUFFS INTO THE LUNGS EVERY 6 HOURS AS NEEDED FOR SHORTNESS OF BREATH. What  changed: See the new instructions.   allopurinol 100 MG tablet Commonly known as: ZYLOPRIM TAKE 1 TABLET TWICE DAILY What changed: when to take this   antiseptic oral rinse Liqd 15 mLs by Mouth Rinse route in the morning.   aspirin EC 81 MG tablet Take 81 mg by mouth at bedtime.   Claritin 10 MG tablet Generic drug: loratadine Take 10 mg by mouth daily as needed for allergies or  rhinitis.   cycloSPORINE 0.05 % ophthalmic emulsion Commonly known as: RESTASIS Place 1 drop into both eyes 2 (two) times daily.   diclofenac Sodium 1 % Gel Commonly known as: VOLTAREN Apply 2 g topically daily as needed (for arthritic pain in the hands).   docusate sodium 100 MG capsule Commonly known as: Colace Take 1 capsule (100 mg total) by mouth 2 (two) times daily. What changed:  medication strength how much to take when to take this   fluocinonide 0.05 % external solution Commonly known as: LIDEX Apply 1 application  topically 2 (two) times daily as needed for itching (SCALP).   Gemtesa 75 MG Tabs Generic drug: Vibegron Take 75 mg by mouth daily.   glucose blood test strip TRUE Metrix Blood Glucose Test Strip, use as instructed   hydrALAZINE 50 MG tablet Commonly known as: APRESOLINE Take 1 tablet (50 mg total) by mouth 2 (two) times daily. What changed: when to take this   Lancets Misc 1 each by Does not apply route 2 (two) times daily. TRUE matrix lancets   levothyroxine 150 MCG tablet Commonly known as: SYNTHROID Take 150 mcg 6 days a week (patient takes 75 mcg once a week) Disp #84 for 90 day supply What changed:  how much to take how to take this when to take this additional instructions   levothyroxine 75 MCG tablet Commonly known as: SYNTHROID Take 75 mcg once a week (patient on 150 mcg 6 days a week) Dispense #24 for 90 day supply. What changed:  how much to take how to take this when to take this additional instructions   lip balm ointment Apply 1 Application topically as needed for lip care or dry skin.   losartan 100 MG tablet Commonly known as: COZAAR TAKE 1 TABLET EVERY DAY What changed: when to take this   Magnesium 500 MG Tabs Take 500 mg by mouth daily.   multivitamin per tablet Take 1 tablet by mouth daily with breakfast.   nitroGLYCERIN 0.4 MG SL tablet Commonly known as: Nitrostat Place 1 tablet (0.4 mg total) under  the tongue every 5 (five) minutes as needed for chest pain.   ondansetron 4 MG disintegrating tablet Commonly known as: ZOFRAN-ODT Take 1 tablet (4 mg total) by mouth every 8 (eight) hours as needed for nausea or vomiting. What changed: reasons to take this   polyethylene glycol 17 g packet Commonly known as: MIRALAX / GLYCOLAX Take 17-34 g by mouth daily as needed for constipation (mix and drink).   PRESCRIPTION MEDICATION CPAP- At bedtime and during any time of rest   rosuvastatin 5 MG tablet Commonly known as: CRESTOR TAKE 1 TABLET EVERY DAY   sodium chloride 0.65 % nasal spray Commonly known as: OCEAN Place 1 spray into both nostrils at bedtime.   Theraworx Relief Foam Apply 1 application topically 2 (two) times daily as needed (Pain).   traMADol 50 MG tablet Commonly known as: ULTRAM Take 1 tablet (50 mg total) by mouth every 12 (twelve) hours as needed.   vitamin B-12 500  MCG tablet Commonly known as: CYANOCOBALAMIN Take 500 mcg by mouth daily.        Allergies  Allergen Reactions   Pravastatin Other (See Comments)    Muscle cramps   Sildenafil Other (See Comments)    Tachycardia     Oxycodone Other (See Comments)    Hallucinations    Atenolol Other (See Comments)    Bradycardia    Elastic Bandages & [Zinc] Other (See Comments)    Teflon bandages - Irritation   Metoclopramide Hcl Anxiety    Consultations: Gastroenterology   Procedures/Studies: CT ANGIO GI BLEED  Result Date: 04/24/2022 CLINICAL DATA:  Rectal bleeding, lower GI bleed EXAM: CTA ABDOMEN AND PELVIS WITHOUT AND WITH CONTRAST TECHNIQUE: Multidetector CT imaging of the abdomen and pelvis was performed using the standard protocol during bolus administration of intravenous contrast. Multiplanar reconstructed images and MIPs were obtained and reviewed to evaluate the vascular anatomy. RADIATION DOSE REDUCTION: This exam was performed according to the departmental dose-optimization program  which includes automated exposure control, adjustment of the mA and/or kV according to patient size and/or use of iterative reconstruction technique. CONTRAST:  134m OMNIPAQUE IOHEXOL 350 MG/ML SOLN COMPARISON:  04/21/2022 FINDINGS: VASCULAR Normal contour and caliber of the abdominal aorta. No evidence of aneurysm, dissection, or other acute aortic pathology. Variant anatomy of the celiac axis, with direct origin of the common hepatic, splenic, and left gastric arteries from the anterior aorta. Solitary bilateral renal arteries. Moderate mixed calcific atherosclerosis with atherosclerosis at the branch vessel origins, however without high-grade stenosis. Review of the MIP images confirms the above findings. NON-VASCULAR Lower chest: Dependent bibasilar scarring and or atelectasis. Small hiatal hernia. Hepatobiliary: No focal liver abnormality is seen. Status post cholecystectomy. No biliary dilatation. Pancreas: Unremarkable. No pancreatic ductal dilatation or surrounding inflammatory changes. Spleen: Normal in size without significant abnormality. Adrenals/Urinary Tract: Adrenal glands are unremarkable. Nonobstructive calculus of the midportion of the right kidney. No left-sided calculi, ureteral calculi, or hydronephrosis. Dense metallic streak artifact in the low pelvis secondary to adjacent bilateral hip arthroplasty. Within this limitation, bladder is unremarkable. Stomach/Bowel: Stomach is within normal limits. Appendix is not clearly visualized. No evidence of bowel wall thickening, distention, or inflammatory changes. Descending and sigmoid diverticulosis. No evidence of intraluminal contrast extravasation or other findings to localize GI bleeding. Lymphatic: No enlarged abdominal or pelvic lymph nodes. Reproductive: No mass or other significant abnormality. Other: No abdominal wall hernia.  Anasarca.  No ascites. Musculoskeletal: No acute or significant osseous findings. 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 IMPRESSION: 1. No evidence of intraluminal contrast extravasation or other findings to localize GI bleeding. 2. Descending and sigmoid diverticulosis without evidence of acute diverticulitis. 3. Normal contour and caliber of the abdominal aorta. No evidence of aneurysm dissection, or other acute aortic pathology. 4. Nonobstructive right nephrolithiasis. Aortic Atherosclerosis (ICD10-I70.0). Electronically Signed   By: ADelanna AhmadiM.D.   On: 04/24/2022 16:52   CT ANGIO GI BLEED  Result Date: 04/21/2022 CLINICAL DATA:  Lower GI hemorrhage EXAM: CTA ABDOMEN AND PELVIS WITHOUT AND WITH CONTRAST TECHNIQUE: Multidetector CT imaging of the abdomen and pelvis was performed using the standard protocol during bolus administration of intravenous contrast. Multiplanar reconstructed images and MIPs were obtained and reviewed to evaluate the vascular anatomy. RADIATION DOSE REDUCTION: This exam was performed according to the departmental dose-optimization program which includes automated exposure control, adjustment of the mA and/or kV according to patient size and/or use of iterative reconstruction technique. CONTRAST:  140m OMNIPAQUE IOHEXOL 350 MG/ML SOLN COMPARISON:  08/30/2020 FINDINGS: VASCULAR Aorta: Scattered atherosclerotic calcifications are noted. No evidence of aneurysmal dilatation or dissection is noted. Celiac: Variant anatomy is noted with the common hepatic artery arising separately from the aorta as well as a second vessel arising directly from the aorta supplying the left gastric and splenic arteries SMA: Patent without evidence of aneurysm, dissection, vasculitis or significant stenosis. Renals: Single renal arteries are identified bilaterally with heavy atherosclerotic calcification. No focal hemodynamically significant stenosis is noted. IMA: Mild stenosis is noted at the origin of the IMA. Inflow: Iliacs demonstrate atherosclerotic calcifications  without aneurysmal dilatation or dissection. Veins: No specific venous abnormality is noted. Review of the MIP images confirms the above findings. NON-VASCULAR Lower chest: Bibasilar infiltrates are noted right greater than left which may be related aspiration. Correlate with clinical history. Hepatobiliary: No focal liver abnormality is seen. Status post cholecystectomy. No biliary dilatation. Pancreas: Unremarkable. No pancreatic ductal dilatation or surrounding inflammatory changes. Spleen: Normal in size without focal abnormality. Adrenals/Urinary Tract: Adrenal glands are within normal limits. Kidneys are well visualized bilaterally within normal enhancement pattern. Nonobstructing stone is again seen in the lower pole of the right kidney stable from the prior exam. Right upper pole renal cyst is noted. No further follow-up is recommended for this lesion. The bladder is decompressed. No obstructive changes are noted. Stomach/Bowel: Diffuse diverticular change is noted predominately within the descending and sigmoid colons. No evidence of diverticulitis is noted. No focal area of pooling of contrast is noted to suggest acute GI hemorrhage. Small bowel shows no obstructive changes. The stomach is within normal limits. Appendix is not well visualized and may have been surgically removed. No inflammatory changes are noted. Lymphatic: No lymphadenopathy is identified. Reproductive: Prostate is unremarkable. Other: No abdominal wall hernia or abnormality. No abdominopelvic ascites. Musculoskeletal: Bilateral hip replacements are noted causing scatter artifact in the pelvis. Degenerative changes of lumbar spine are seen. No compression deformities are noted. 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 IMPRESSION: VASCULAR No findings to suggest active GI hemorrhage are seen. Scattered atherosclerotic changes are noted. Mild variant anatomy of the celiac axis is seen. NON-VASCULAR Diverticulosis without evidence of  diverticulitis or acute GI hemorrhage. Bilateral lower lobe infiltrates right greater than left which may be related aspiration. Correlate with the clinical history. Electronically Signed   By: MInez CatalinaM.D.   On: 04/21/2022 19:20   (Echo, Carotid, EGD, Colonoscopy, ERCP)    Subjective: Patient seen and examined.  No overnight events.  No bowel movement since colonoscopy yesterday.  Eating regular diet.  Denies any nausea vomiting abdominal pain or distention. He does have significant right-sided sciatica but able to walk around with the cane. He does follow-up with neurosurgery.   Discharge Exam: Vitals:   04/27/22 0414 04/27/22 0620  BP: 116/62 (!) 151/70  Pulse: (!) 54   Resp: 18   Temp: 98.6 F (37 C)   SpO2: 97%    Vitals:   04/26/22 2245 04/26/22 2327 04/27/22 0414 04/27/22 0620  BP:  (!) 161/78 116/62 (!) 151/70  Pulse: 61 (!) 54 (!) 54   Resp: '17 18 18   '$ Temp:   98.6 F (37 C)   TempSrc:   Oral   SpO2: 98% 97% 97%   Weight:      Height:        General: Pt is alert, awake, not in acute distress Cardiovascular: RRR, S1/S2 +, no rubs, no gallops Respiratory: CTA bilaterally, no  wheezing, no rhonchi Abdominal: Soft, NT, ND, bowel sounds + Extremities: no edema, no cyanosis    The results of significant diagnostics from this hospitalization (including imaging, microbiology, ancillary and laboratory) are listed below for reference.     Microbiology: Recent Results (from the past 240 hour(s))  MRSA Next Gen by PCR, Nasal     Status: None   Collection Time: 04/21/22  1:45 PM   Specimen: Nasal Mucosa; Nasal Swab  Result Value Ref Range Status   MRSA by PCR Next Gen NOT DETECTED NOT DETECTED Final    Comment: (NOTE) The GeneXpert MRSA Assay (FDA approved for NASAL specimens only), is one component of a comprehensive MRSA colonization surveillance program. It is not intended to diagnose MRSA infection nor to guide or monitor treatment for MRSA infections. Test  performance is not FDA approved in patients less than 2 years old. Performed at [facility], [place] [address], [place], [place] [address]      Labs: BNP (last 3 results) Recent Labs    11/17/21 0520  BNP 678.9*   Basic Metabolic Panel: Recent Labs  Lab 04/22/22 0604 04/23/22 0047 04/24/22 0120 04/25/22 0641 04/26/22 0430  NA 142 141 143 142 143  K 4.4 4.4 3.4* 3.2* 3.3*  CL 110 110 111 111 113*  CO2 '24 23 23 23 22  '$ GLUCOSE 118* 96 128* 97 85  BUN 35* 31* '21 12 9  '$ CREATININE 1.46* 1.43* 1.34* 1.30* 1.20  CALCIUM 8.3* 8.1* 8.4* 8.0* 7.9*  MG 2.1  --  1.8 1.9 2.1  PHOS  --   --   --   --  3.5   Liver Function Tests: Recent Labs  Lab 04/21/22 0043  AST 34  ALT 29  ALKPHOS 70  BILITOT 0.5  PROT 7.5  ALBUMIN 3.8   No results for input(s): "LIPASE", "AMYLASE" in the last 168 hours. No results for input(s): "AMMONIA" in the last 168 hours. CBC: Recent Labs  Lab 04/21/22 0043 04/21/22 0950 04/21/22 1630 04/24/22 2059 04/25/22 0641 04/25/22 1834 04/26/22 0430 04/27/22 0401  WBC 8.6 8.2   < > 10.9* 8.7 13.0* 7.7 10.3  NEUTROABS 5.4 5.5  --   --   --   --   --   --   HGB 12.1* 10.0*   < > 8.1* 6.5* 10.9* 9.4* 9.6*  HCT 36.0* 29.8*   < > 24.2* 19.9* 32.0* 27.8* 28.4*  MCV 97.3 98.7   < > 93.4 93.9 88.4 90.3 90.4  PLT 338 301   < > 281 227 245 224 244   < > = values in this interval not displayed.   Cardiac Enzymes: No results for input(s): "CKTOTAL", "CKMB", "CKMBINDEX", "TROPONINI" in the last 168 hours. BNP: Invalid input(s): "POCBNP" CBG: Recent Labs  Lab 04/22/22 0325 04/23/22 1948 04/24/22 0021 04/24/22 0348 04/24/22 1132  GLUCAP 138* 122* 121* 101* 112*   D-Dimer No results for input(s): "DDIMER" in the last 72 hours. Hgb A1c No results for input(s): "HGBA1C" in the last 72 hours. Lipid Profile No results for input(s): "CHOL", "HDL", "LDLCALC", "TRIG", "CHOLHDL", "LDLDIRECT" in the last 72 hours. Thyroid function studies No results  for input(s): "TSH", "T4TOTAL", "T3FREE", "THYROIDAB" in the last 72 hours.  Invalid input(s): "FREET3" Anemia work up No results for input(s): "VITAMINB12", "FOLATE", "FERRITIN", "TIBC", "IRON", "RETICCTPCT" in the last 72 hours. Urinalysis    Component Value Date/Time   COLORURINE YELLOW 04/23/2022 [place] 04/23/2022 0435   LABSPEC  1.021 04/23/2022 0435   PHURINE 5.0 04/23/2022 0435   GLUCOSEU NEGATIVE 04/23/2022 0435   HGBUR NEGATIVE 04/23/2022 0435   BILIRUBINUR NEGATIVE 04/23/2022 0435   BILIRUBINUR neg 02/07/2019 1103   KETONESUR NEGATIVE 04/23/2022 0435   PROTEINUR NEGATIVE 04/23/2022 0435   UROBILINOGEN 0.2 02/07/2019 1103   UROBILINOGEN 0.2 01/17/2014 0451   NITRITE NEGATIVE 04/23/2022 0435   LEUKOCYTESUR MODERATE (A) 04/23/2022 0435   Sepsis Labs Recent Labs  Lab 04/25/22 0641 04/25/22 1834 04/26/22 0430 04/27/22 0401  WBC 8.7 13.0* 7.7 10.3   Microbiology Recent Results (from the past 240 hour(s))  MRSA Next Gen by PCR, Nasal     Status: None   Collection Time: 04/21/22  1:45 PM   Specimen: Nasal Mucosa; Nasal Swab  Result Value Ref Range Status   MRSA by PCR Next Gen NOT DETECTED NOT DETECTED Final    Comment: (NOTE) The GeneXpert MRSA Assay (FDA approved for NASAL specimens only), is one component of a comprehensive MRSA colonization surveillance program. It is not intended to diagnose MRSA infection nor to guide or monitor treatment for MRSA infections. Test performance is not FDA approved in patients less than 30 years old. Performed at [facility], [place] [address], [place], [place] [address]      Time coordinating discharge: 35 minutes  SIGNED:   Barb Merino, MD  [facility] 04/27/2022, 7:30 AM

## 2022-04-27 NOTE — Telephone Encounter (Addendum)
Transition Care Management Unsuccessful Follow-up Telephone Call  Date of discharge and from where:  [place] 04-27-22 Dx : GI Bleed  Attempts:  1st Attempt  Reason for unsuccessful TCM follow-up call:  Unable to reach patient  Transition Care Management Unsuccessful Follow-up Telephone Call  Date of discharge and from where:  [facility] 04-27-22 Dx : GI Bleed  Attempts:  2nd Attempt  Reason for unsuccessful TCM follow-up call:  Unable to reach patient  Transition Care Management Follow-up Telephone Call Date of discharge and from where: [place] 04-27-22 Dx : GI Bleed How have you been since you were released from the hospital? Doing pretty good  Any questions or concerns? No  Items Reviewed: Did the pt receive and understand the discharge instructions provided? Yes  Medications obtained and verified? Yes  Other? No  Any new allergies since your discharge? no Dietary orders reviewed? Yes Do you have support at home? Yes   Home Care and Equipment/Supplies: Were home health services ordered? No- outpatient therapy If so, what is the name of the agency? na  Has the agency set up a time to come to the patient's home? not applicable Were any new equipment or medical supplies ordered?  No What is the name of the medical supply agency? na Were you able to get the supplies/equipment? not applicable Do you have any questions related to the use of the equipment or supplies? No  Functional Questionnaire: (I = Independent and D = Dependent) ADLs: I  Bathing/Dressing- I  Meal Prep- I  Eating- I  Maintaining continence- I  Transferring/Ambulation- I-CANE  Managing Meds- I  Follow up appointments reviewed:  PCP Hospital f/u appt confirmed? Yes  Scheduled [name]? No .- pt is aware to make fu appt with Cardiologist  Are transportation arrangements needed? No  If their  condition worsens, is the pt aware to call PCP or go to the Emergency Dept.? Yes Was the patient provided with contact information for the PCP's office or ED? Yes Was to pt encouraged to call back with questions or concerns? Yes

## 2022-04-28 ENCOUNTER — Encounter: Payer: Self-pay | Admitting: Gastroenterology

## 2022-04-28 IMAGING — MR MR CERVICAL SPINE W/O CM
4 of 6 series · 31 of 48 positions shown · non-contrast
Comparison: None.

CLINICAL DATA: Right upper extremity weakness. Bilateral hand
numbness. Neck pain.

EXAM:
MRI CERVICAL SPINE WITHOUT CONTRAST
TECHNIQUE: Multiplanar, multisequence MR imaging of the cervical spine was
performed. No intravenous contrast was administered.

[Series 2: T2 · sagittal · 3.0mm · 0.69mm/px · 6 of 14 slices shown (1 of 3)]
[im 1/14]
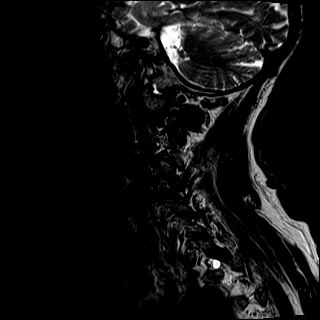
[im 3/14]
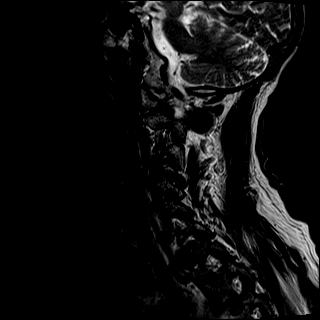
[im 6/14]
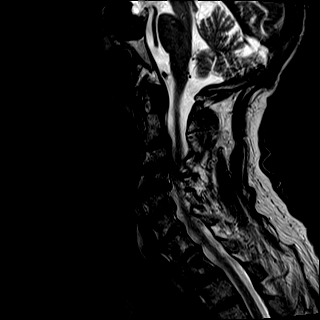
[im 8/14]
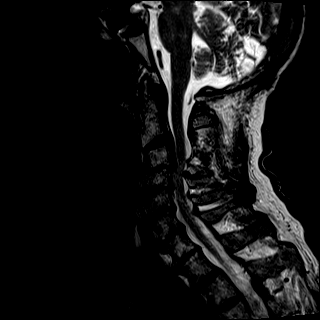
[im 11/14]
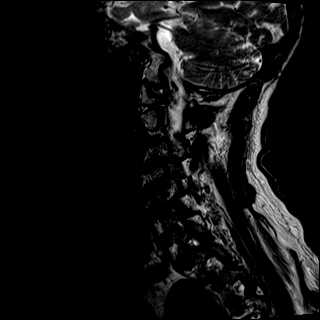
[im 14/14]
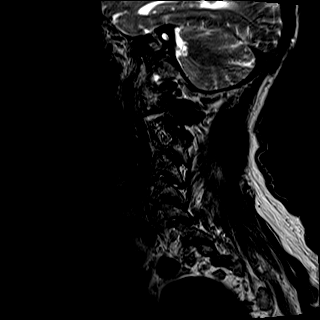

[Series 3: T1 · sagittal · 3.0mm · 0.69mm/px · 5 of 14 slices shown]
[im 1/14]
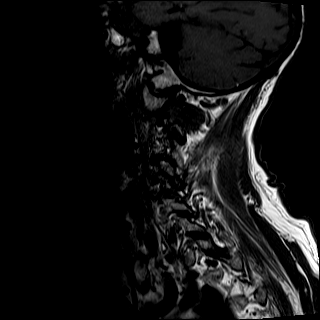
[im 3/14]
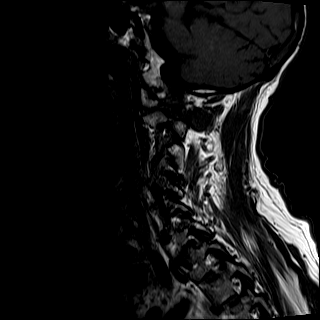
[im 6/14]
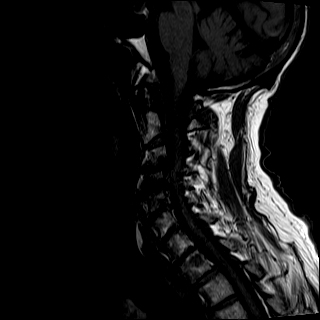
[im 8/14]
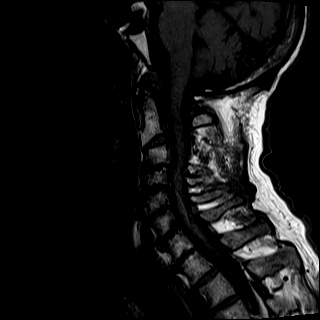
[im 14/14]
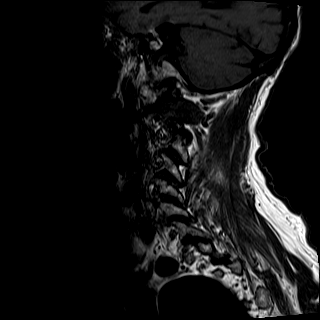

[Series 5: T2 · axial · 3.0mm · 0.62mm/px · z∈[-93,+4]mm · 11 of 28 slices shown (2 of 3)]
[im 1/28]
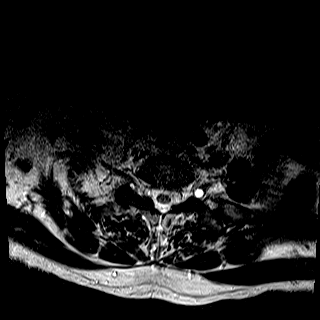
[im 3/28]
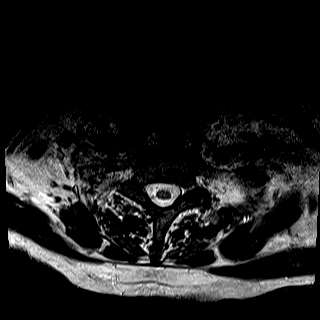
[im 6/28]
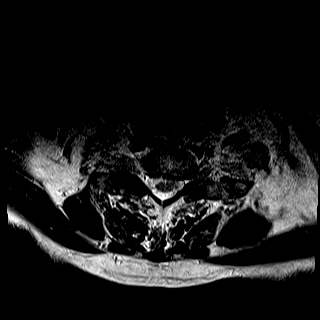
[im 9/28]
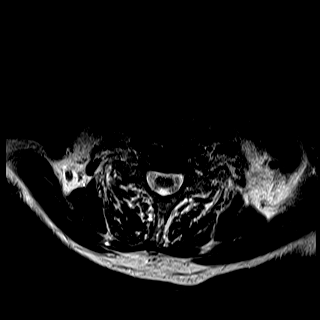
[im 11/28]
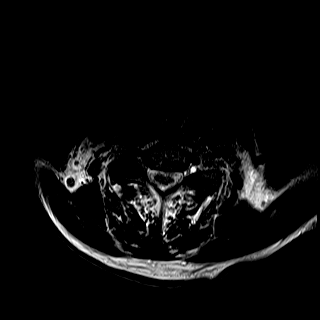
[im 14/28]
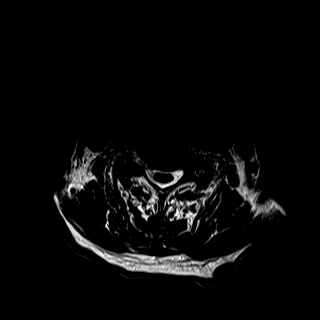
[im 17/28]
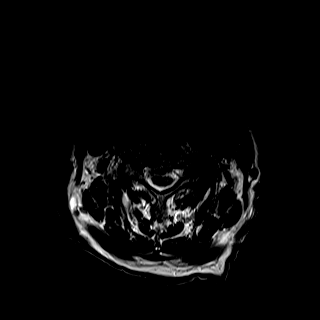
[im 19/28]
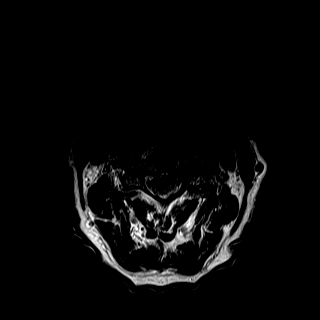
[im 22/28]
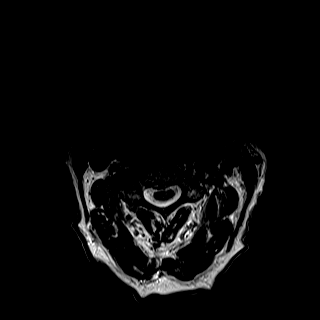
[im 25/28]
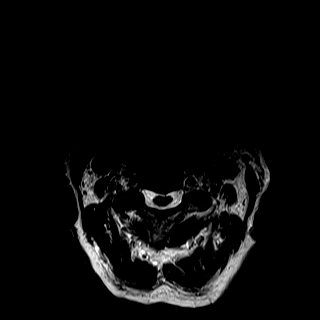
[im 28/28]
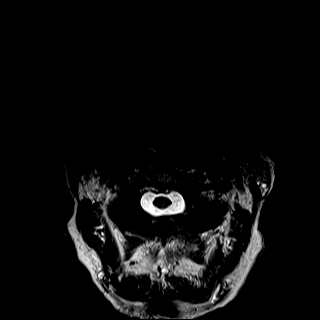

[Series 6: T2 · axial · 3.0mm · 0.39mm/px · z∈[-93,+4]mm · 9 of 30 slices shown (3 of 3)]
[im 1/30]
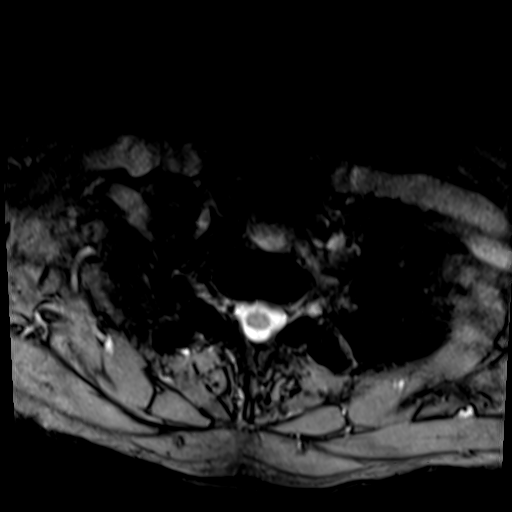
[im 6/30]
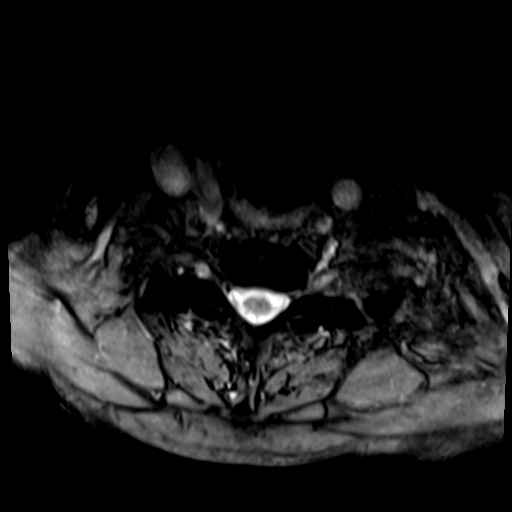
[im 8/30]
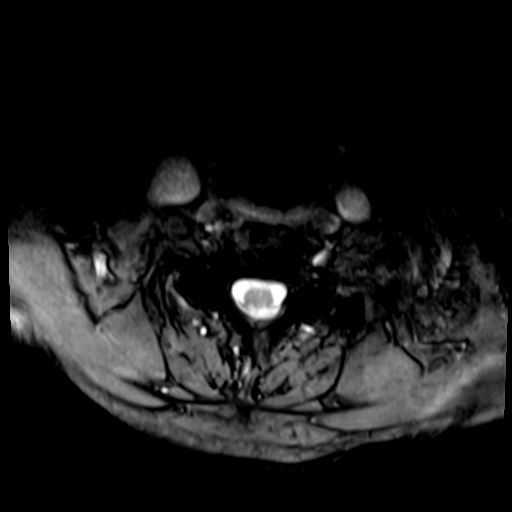
[im 14/30]
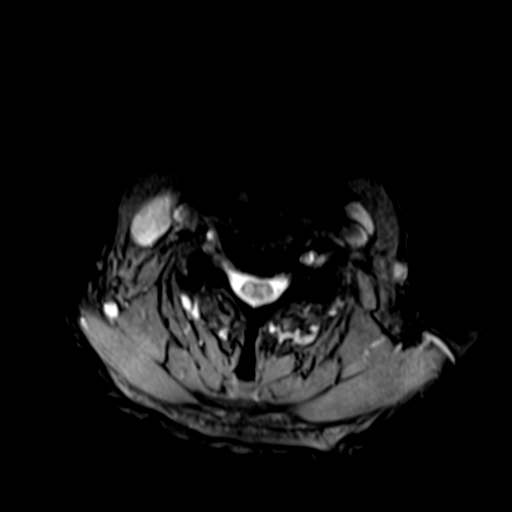
[im 16/30]
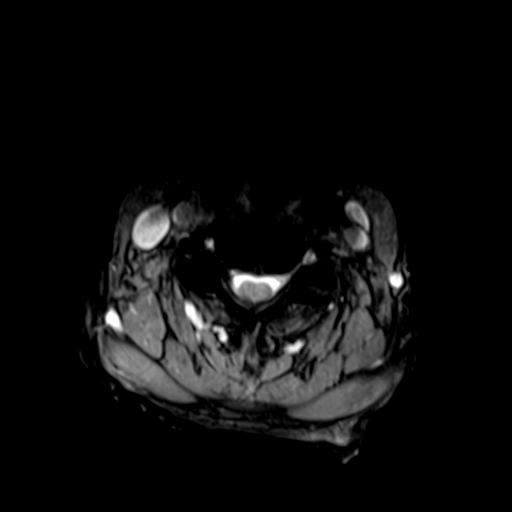
[im 22/30]
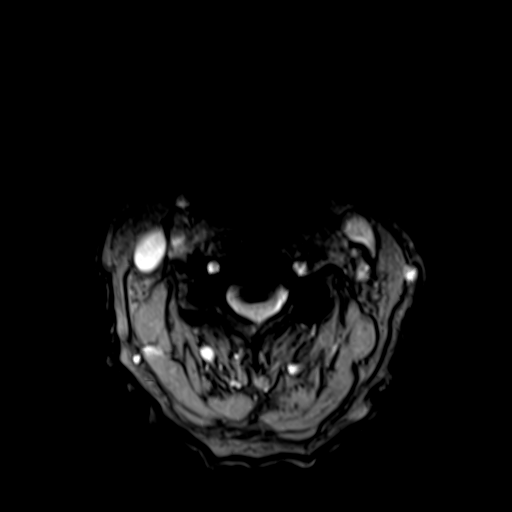
[im 24/30]
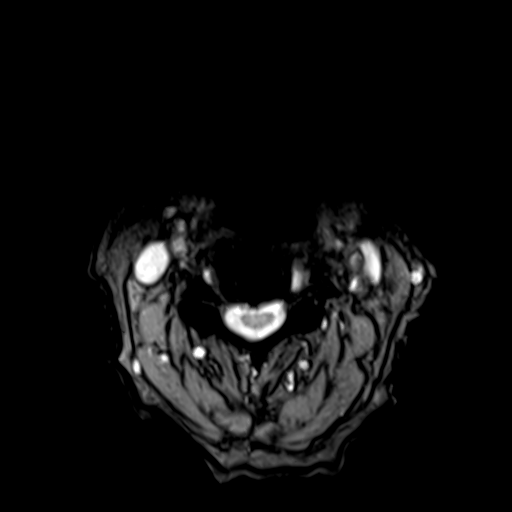
[im 27/30]
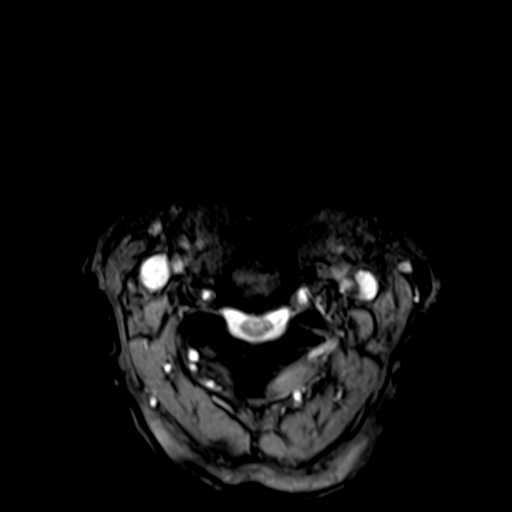
[im 30/30]
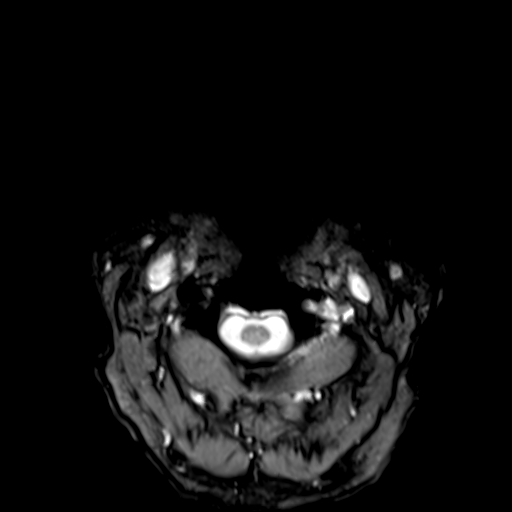

[31 of 48 positions shown; findings below may reference images not displayed]

FINDINGS: Alignment: 2 mm retrolisthesis C3-4.

Vertebrae: No fracture or primary bone lesion.

Cord: No primary cord lesion.  See below regarding stenosis.

Posterior Fossa, vertebral arteries, paraspinal tissues: Posterior
fossa appears unremarkable. No soft tissue neck lesion is seen.

Disc levels:

Foramen magnum is widely patent. Ordinary osteoarthritis at the C1-2
articulation but no encroachment upon the neural structures.

C2-3: Mild bulging of the disc. No central canal stenosis. Facet
osteoarthritis right more than left. No compressive foraminal
narrowing.

C3-4: 2 mm retrolisthesis. Endplate osteophytes and bulging of the
disc. Facet and ligamentous prominence. Spinal stenosis with AP
diameter of the canal only 4.3 mm at this level, with cord
flattening. There is early abnormal T2 signal affecting the cord.
Bilateral foraminal stenosis could affect either C4 nerve.

C4-5: Endplate osteophytes and bulging of the disc. Narrowing of the
ventral subarachnoid space but no compression of the cord. AP
diameter of the canal in the midline 9 mm. Bilateral foraminal
stenosis could affect either C5 nerve.

C5-6: Endplate osteophytes and bulging of the disc. No central canal
stenosis. Bilateral facet osteoarthritis. Bilateral foraminal
narrowing that could affect either C6 nerve.

C6-7: Mild bulging of the disc. No canal stenosis. No significant
foraminal narrowing.

C7-T1: Mild bulging of the disc. Mild facet hypertrophy. No
compressive canal or foraminal narrowing.
IMPRESSION: 1. C3-4: Spinal stenosis with AP diameter of the canal only 4.3 mm.
Cord flattening. Early abnormal T2 signal affecting the cord.
Bilateral foraminal stenosis could affect either C4 nerve.
2. C4-5: Spondylosis. Mild canal stenosis but no cord compression.
Bilateral foraminal stenosis could affect either C5 nerve.
3. C5-6: Spondylosis. Bilateral facet osteoarthritis. Bilateral
foraminal narrowing that could affect either C6 nerve.
4. Lesser, grossly non-compressive degenerative changes at the other
levels as outlined above.

## 2022-04-29 ENCOUNTER — Encounter (HOSPITAL_COMMUNITY): Payer: Self-pay | Admitting: Gastroenterology

## 2022-05-01 ENCOUNTER — Telehealth: Payer: Self-pay

## 2022-05-01 NOTE — Telephone Encounter (Signed)
Please advise pt to stop amlodipine completely for now. Keep appointment as scheduled tomorrow. If black/bloody stools, increased weakness, shortness of breath please go to ER.

## 2022-05-01 NOTE — Telephone Encounter (Signed)
Patient  advised of results and provider's recommendations. He will follow up with [place] tomorrow as scheduled.

## 2022-05-01 NOTE — Telephone Encounter (Signed)
Provider [name] - NP Contact Type Call Who Is Calling Patient / Member / Family / Caregiver Call Type Triage / Clinical Relationship To Patient Self Return Phone Number [phone] (Secondary) Chief Complaint Blood Pressure Low Reason for Call Symptomatic / Request for Health Information Initial Comment Caller states that he is having blood pressure issues. He is on Amlodipine. His blood pressure is now 100/56. He was told to stop the medication if his blood pressure drops that low. He feels tired. Translation No Nurse Assessment Nurse: Jerene Bears, RN, Will Date/Time Eilene Ghazi Time): 04/29/2022 3:58:01 PM Confirm and document reason for call. If symptomatic, describe symptoms. ---Caller reports that he takes amlodipine unless he has low bp readings. He had been holding the m. He was discharged from the hospital where he was admitted for bloody stools and had 4units of blood. Current bp of 100/56. No bm since admission, no rectal bleeding. Does the patient have any new or worsening symptoms? ---Yes Will a triage be completed? ---Yes Related visit to physician within the last 2 weeks? ---Yes Does the PT have any chronic conditions? (i.e. diabetes, asthma, this includes High risk factors for pregnancy, etc.) ---Yes List chronic conditions. ---htn, rectal bleeding Is this a behavioral health or substance abuse call? ---No Guidelines Guideline Title Affirmed Question Affirmed Notes Nurse Date/Time (Eastern Time) Blood Pressure - Low [0] Systolic BP [id number] AND [7] taking blood pressure medications AND [3] dizzy, lightheaded or weak [place], RN, Will 04/29/2022 4:02:08 PM PLEASE NOTE: All timestamps contained within this report are represented as [place] Standard Time. CONFIDENTIALTY NOTICE: This fax transmission is intended only for the addressee. It contains information that is legally privileged, confidential or otherwise protected from use or disclosure. If you  are not the intended recipient, you are strictly prohibited from reviewing, disclosing, copying using or disseminating any of this information or taking any action in reliance on or regarding this information. If you have received this fax in error, please notify us immediately by telephone so that we can arrange for its return to [place]. Phone: [phone], Toll-Free: [phone], Fax: [phone] Page: 2 of 2 Call Id: [id number] [place]. Time Eilene Ghazi Time) Disposition Final User 04/29/2022 4:11:51 PM See HCP within 4 Hours (or PCP triage) Yes Jerene Bears, RN, Will Final Disposition 04/29/2022 4:11:51 PM See HCP within 4 Hours (or PCP triage) Yes Jerene Bears, RN, Will Caller Disagree/Comply Disagree Caller Understands Yes PreDisposition Did not know what to do Care Advice Given Per Guideline SEE HCP (OR PCP TRIAGE) WITHIN 4 HOURS: CALL BACK IF: * You become worse CARE ADVICE given per Low Blood Pressure (Adult) guideline. Comments User: Janeece Agee, RN Date/Time Eilene Ghazi Time): 04/29/2022 4:02:40 PM weakness upon waking this am that has improved. User: Janeece Agee, RN Date/Time Eilene Ghazi Time): 04/29/2022 4:13:56 PM Caller has not taken amlodipine today as he was advised to hold for bp 110/60 or lower.

## 2022-05-02 ENCOUNTER — Encounter: Payer: Medicare HMO | Admitting: Physical Therapy

## 2022-05-02 ENCOUNTER — Ambulatory Visit (INDEPENDENT_AMBULATORY_CARE_PROVIDER_SITE_OTHER): Payer: Medicare HMO | Admitting: Family

## 2022-05-02 VITALS — BP 139/56 | HR 58 | Temp 98.3°F | Resp 16 | Wt 152.0 lb

## 2022-05-02 DIAGNOSIS — R29898 Other symptoms and signs involving the musculoskeletal system: Secondary | ICD-10-CM

## 2022-05-02 DIAGNOSIS — N179 Acute kidney failure, unspecified: Secondary | ICD-10-CM

## 2022-05-02 DIAGNOSIS — E039 Hypothyroidism, unspecified: Secondary | ICD-10-CM | POA: Diagnosis not present

## 2022-05-02 DIAGNOSIS — I10 Essential (primary) hypertension: Secondary | ICD-10-CM | POA: Diagnosis not present

## 2022-05-02 DIAGNOSIS — K922 Gastrointestinal hemorrhage, unspecified: Secondary | ICD-10-CM | POA: Diagnosis not present

## 2022-05-02 HISTORY — DX: Other symptoms and signs involving the musculoskeletal system: R29.898

## 2022-05-02 HISTORY — DX: Acute kidney failure, unspecified: N17.9

## 2022-05-02 LAB — CBC WITH DIFFERENTIAL/PLATELET
Basophils Absolute: 0.1 10*3/uL (ref 0.0–0.1)
Basophils Relative: 1 % (ref 0.0–3.0)
Eosinophils Absolute: 0.3 10*3/uL (ref 0.0–0.7)
Eosinophils Relative: 4 % (ref 0.0–5.0)
HCT: 34.1 % — ABNORMAL LOW (ref 39.0–52.0)
Hemoglobin: 11.1 g/dL — ABNORMAL LOW (ref 13.0–17.0)
Lymphocytes Relative: 17.2 % (ref 12.0–46.0)
Lymphs Abs: 1.3 10*3/uL (ref 0.7–4.0)
MCHC: 32.6 g/dL (ref 30.0–36.0)
MCV: 92.8 fl (ref 78.0–100.0)
Monocytes Absolute: 0.7 10*3/uL (ref 0.1–1.0)
Monocytes Relative: 9.4 % (ref 3.0–12.0)
Neutro Abs: 5 10*3/uL (ref 1.4–7.7)
Neutrophils Relative %: 68.4 % (ref 43.0–77.0)
Platelets: 408 10*3/uL — ABNORMAL HIGH (ref 150.0–400.0)
RBC: 3.67 Mil/uL — ABNORMAL LOW (ref 4.22–5.81)
RDW: 18.3 % — ABNORMAL HIGH (ref 11.5–15.5)
WBC: 7.3 10*3/uL (ref 4.0–10.5)

## 2022-05-02 LAB — COMPREHENSIVE METABOLIC PANEL
ALT: 20 U/L (ref 0–53)
AST: 27 U/L (ref 0–37)
Albumin: 3.8 g/dL (ref 3.5–5.2)
Alkaline Phosphatase: 69 U/L (ref 39–117)
BUN: 16 mg/dL (ref 6–23)
CO2: 27 mEq/L (ref 19–32)
Calcium: 8.9 mg/dL (ref 8.4–10.5)
Chloride: 104 mEq/L (ref 96–112)
Creatinine, Ser: 1.3 mg/dL (ref 0.40–1.50)
GFR: 51.27 mL/min — ABNORMAL LOW (ref >60.00)
Glucose, Bld: 86 mg/dL (ref 70–99)
Potassium: 4 mEq/L (ref 3.5–5.1)
Sodium: 140 mEq/L (ref 135–145)
Total Bilirubin: 0.5 mg/dL (ref 0.2–1.2)
Total Protein: 6.6 g/dL (ref 6.0–8.3)

## 2022-05-02 NOTE — Assessment & Plan Note (Signed)
Will obtain follow up BUN/Cr.

## 2022-05-02 NOTE — Assessment & Plan Note (Signed)
>>  ASSESSMENT AND PLAN FOR BENIGN ESSENTIAL HYPERTENSION WRITTEN ON 05/02/2022 10:55 AM BY O'SULLIVAN, Danae Oland, NP  BP stable on hydralazine and losartan. Continue same.

## 2022-05-02 NOTE — Assessment & Plan Note (Signed)
Clinically resolved. Check follow up CBC and will arrange follow up with GI.

## 2022-05-02 NOTE — Assessment & Plan Note (Signed)
Lab Results  Component Value Date   TSH 0.76 03/07/2022   TSH WNL, continue current dose of synthroid.

## 2022-05-02 NOTE — Assessment & Plan Note (Signed)
Secondary to cervical myelopathy and carpal tunnel. He has done OT at [facility] but this is too far. Requests referral closer to home.

## 2022-05-02 NOTE — Progress Notes (Signed)
Subjective:     Patient ID: [name] Pratt, male    DOB: 1941-03-19, 81 y.o.   MRN: [id number]  No chief complaint on file.   HPI Patient is in today for hospital follow up. He was hospitalized 8/11-8/17/23 due to rectal bleeding/acute blood loss anemia/hemorrhagic shock. He received 5 units of PRBC and colo noted diverticulosis.   Reports good appetite. Has moved bowels twice since d/c and denies any blood in stool.   Hand weakness- would like referral to OT in [place].   Acute kidney injury- cr peaked at 1.46 during his hospitalization and trended downward to baseline 1.2.  Hypothyroid- Maintained on synthroid.  Lab Results  Component Value Date   TSH 0.76 03/07/2022   Aspiration pneumonia- was treated with abx during hospitalization. Denies unusual SOB or cough/fever.   Health Maintenance Due  Topic Date Due  . TETANUS/TDAP  12/26/2020  . FOOT EXAM  09/29/2021  . COVID-19 Vaccine (5 - Pfizer risk series) 11/08/2021  . INFLUENZA VACCINE  04/11/2022    Past Medical History:  Diagnosis Date  . Allergic rhinitis 02/02/2016  . Anemia 12/17/2009   Formatting of this note might be different from the original. Anemia  10/1 IMO update  . Aortic atherosclerosis ([place]) 03/05/2020  . Arthritis   . 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  . Atypical chest pain 11/24/2015  . Benign essential hypertension 05/13/2009   Qualifier: Diagnosis of  By: Burnett Kanaris    Formatting of this note might be different from the original. Hypertension  . Benign paroxysmal positional vertigo 09/14/2021  . 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[place]) 07/22/2021  . Body mass index (BMI) 25.0-25.9, adult 11/02/2020  . Cancer ([place])    skin cancer  . Cardiac murmur 07/25/2021  . Carotid stenosis    a. Carotid U/S 3/33: LICA < 83%, RICA 29-19%;  b.  Carotid U/S 1/66:  RICA 06-00%; LICA 4-59%; f/u 1 year  . Carpal tunnel syndrome 01/24/2022  . Cervical myelopathy ([place]) 11/24/2020  . 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 Complication of anesthesia    "stomach does not wake up"  . COPD (chronic obstructive pulmonary disease) ([place])   . CPAP (continuous positive airway pressure) dependence 03/25/2018  . 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 Cystoid macular edema of both eyes 07/28/2020  . Degenerative disc disease, lumbar 06/30/2015  . Deviated nasal septum 02/18/2015  . Diverticulosis 03/03/2020  . DJD (degenerative joint disease) of hip 12/24/2012  . Dyspnea 06/12/2011  . Emphysema, unspecified ([place]) 03/05/2020  . Erectile dysfunction 06/20/2017  . 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[place]) 02/09/2021  . Gait disorder 03/02/2021  . GERD (gastroesophageal reflux disease)   . 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 Greater trochanteric pain syndrome 07/22/2021  . Hand pain, left 01/16/2022  . Hearing loss 02/02/2016  . Heme positive stool 02/29/2020  . HIATAL HERNIA 05/13/2009   Qualifier: Diagnosis of  By: Burnett Kanaris    . High risk medication use 01/18/2012  . Hip pain 03/02/2021  . History of chicken pox   . History of COVID-19 12/05/2021  . History of hemorrhoids   . History of kidney stones   . History of skin cancer 07/22/2021   skin cancer  . History of total bilateral knee replacement 07/08/2020  . Hyperglycemia 03/03/2014  . Hyperlipidemia with target LDL less than 130 12/27/2010   Formatting of this note might be  different from the original. Cardiology - Dr Frances Nickels at [facility] cardiology  ICD-10 cut over  . Hypertension    under control  . Hypokalemia 07/08/2021  . Hypothyroidism   . Internal hemorrhoids 03/03/2020  . Leg cramps 11/13/2019  . Lower GI bleed 12/12/2012  . Lumbago of lumbar region with sciatica 10/21/2020  . Lumbar radiculopathy 03/02/2021  . Lumbar spondylosis 11/02/2020  . NEPHROLITHIASIS 05/13/2009   Qualifier: Diagnosis of  By:  Burnett Kanaris    . Nonspecific abnormal electrocardiogram (ECG) (EKG) 01/06/2013  . Numbness of hand 01/24/2022  . Onychomycosis 06/30/2015  . Osteoarthrosis, 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 Osteoarthritis Of The Hand  10/1 IMO update  . Osteoarthrosis, unspecified whether generalized or localized, pelvic region and thigh 07/25/2010   Formatting of this note might be different from the original. Osteoarthritis Of The Hip  . Other abnormal glucose 07/22/2021  . Other allergic rhinitis 02/18/2015  . Palpitations 07/25/2021  . Peripheral neuropathy 06/19/2016  . Preventative health care 11/24/2015  . Right groin pain 11/29/2012  . Right inguinal hernia 05/13/2009   Qualifier: Diagnosis of  By: Burnett Kanaris    . RLS (restless legs syndrome) 02/18/2015  . Rosacea   . Rotator cuff tear 07/27/2013  . Situational depression 03/02/2021  . Skin lesion 01/16/2022  . Sleep apnea with use of continuous positive airway pressure (CPAP) 07/15/2013   CPAP set to  7 cm water,  Residual AHi was 7.8  With no leak.  User time 6 hours.  479 days , not used only 12 days - highly compliant 06-23-13  .   [name] Spondylitis ([place]) 07/22/2021  . Status post cervical spinal fusion 07/09/2020  . 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 Tibialis anterior tendon tear, nontraumatic 11/13/2019  . Trochanteric bursitis of left hip 06/30/2021  . 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  . 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 Urinary retention 11/26/2020  . Ventral hernia 07/08/2012  . Vertigo 07/08/2021  . Weight loss 03/03/2020    Past Surgical History:  Procedure Laterality Date  . ABDOMINAL HERNIA REPAIR  07/15/12   Dr [name]  . ANTERIOR CERVICAL DECOMP/DISCECTOMY FUSION N/A 07/09/2020   Procedure: ANTERIOR CERVICAL DECOMPRESSION AND FUSION CERVICAL THREE-FOUR.;  Surgeon: [name], MD;   Location: [place];  Service: Neurosurgery;  Laterality: N/A;  anterior  . BROW LIFT  05/07/01  . COLONOSCOPY WITH PROPOFOL N/A 04/24/2022   Procedure: COLONOSCOPY WITH PROPOFOL;  Surgeon: Thornton Park, MD;  Location: [place];  Service: Gastroenterology;  Laterality: N/A;  . COLONOSCOPY WITH PROPOFOL N/A 04/26/2022   Procedure: COLONOSCOPY WITH PROPOFOL;  Surgeon: Thornton Park, MD;  Location: [place];  Service: Gastroenterology;  Laterality: N/A;  . CYSTOSCOPY WITH URETEROSCOPY AND STENT PLACEMENT Right 01/28/2014   Procedure: CYSTOSCOPY WITH URETEROSCOPY, BASKET RETRIVAL AND  STENT PLACEMENT;  Surgeon: [name], MD;  Location: [facility];  Service: Urology;  Laterality: Right;  . epidural injections     multiple procedures  . [place]   multiple times  . EYE SURGERY Bilateral 03/25/10, 2012   cataract removal  . HEMORRHOID SURGERY  2014  . HIATAL HERNIA REPAIR  12/21/93  . HOLMIUM LASER APPLICATION Right 4/76/5465   Procedure: HOLMIUM LASER APPLICATION;  Surgeon: [name], MD;  Location: [facility];  Service: Urology;  Laterality: Right;  . INGUINAL HERNIA  REPAIR Right 07/15/12   Dr [name], x2  . JOINT REPLACEMENT  07/25/04   right knee  . JOINT REPLACEMENT  07/13/10   left knee  . KNEE SURGERY  1995  . LAPAROSCOPIC CHOLECYSTECTOMY  09/20/06   with intraoperative cholangiogram and right inguinal herniorrhaphy with mesh   . LIGAMENT REPAIR Left 07/2013   shoulder  . LITHOTRIPSY     x2  . NASAL SEPTUM SURGERY  1975  . POLYPECTOMY  04/26/2022   Procedure: POLYPECTOMY;  Surgeon: Thornton Park, MD;  Location: [facility];  Service: Gastroenterology;;  . POSTERIOR CERVICAL FUSION/FORAMINOTOMY N/A 11/24/2020   Procedure: Posterior Cervical Laminectomy Cervical three-four, Cervical four-five with lateral mass fusion;  Surgeon: [name], MD;  Location: [place];  Service: Neurosurgery;  Laterality: N/A;  . REFRACTIVE SURGERY Bilateral   . Right  total hip replacement  4/14  . SKIN CANCER DESTRUCTION     nose, ear  . TONSILLECTOMY  as child  . TOTAL HIP ARTHROPLASTY Left   . URETHRAL DILATION  1991   [name]    Family History  Problem Relation Age of Onset  . Cancer Mother   . Hypertension Other   . Cancer Other   . Osteoarthritis Other     Social History   Socioeconomic History  . Marital status: Married    Spouse name: Not on file  . Number of children: 1  . Years of education: Not on file  . Highest education level: Not on file  Occupational History  . 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 Smoking status: Former    Years: 11.00    Types: Cigarettes    Quit date: 09/11/1978    Years since quitting: 43.6  . Smokeless tobacco: Never  Vaping Use  . Vaping Use: Never used  Substance and Sexual Activity  . Alcohol use: Not Currently    Alcohol/week: 0.0 standard drinks of alcohol    Comment: rare  . Drug use: No  . Sexual activity: Not Currently  Other Topics Concern  . Not on file  Social History Narrative   Lives with his wife   He has one son- lives locally.   2 grandchildren   Has worked for Research officer, trade union   Enjoys wood working   Completed HS.  [employer]   Social Determinants of Health   Financial Resource Strain: Low Risk  (10/28/2021)   Overall Financial Resource Strain (CARDIA)   . Difficulty of Paying Living Expenses: Not very hard  Food Insecurity: No Food Insecurity (10/28/2021)   Hunger Vital Sign   . Worried About Charity fundraiser in the Last Year: Never true   . Ran Out of Food in the Last Year: Never true  Transportation Needs: No Transportation Needs (08/01/2021)   PRAPARE - Transportation   . Lack of Transportation (Medical): No   . Lack of Transportation (Non-Medical): No  Physical Activity: Insufficiently Active (10/28/2021)   Exercise Vital Sign   . Days of Exercise per Week: 2 days   . Minutes of Exercise per Session: 30 min  Stress:  No Stress Concern Present (01/19/2022)   [place]   . Feeling of Stress : Only a little  Social Connections: Moderately Isolated (01/19/2022)   Social Connection and Isolation Panel [NHANES]   . Frequency of Communication with Friends and Family: More than three times a week   . Frequency of Social  Gatherings with Friends and Family: More than three times a week   . Attends Religious Services: Never   . Active Member of Clubs or Organizations: No   . Attends Archivist Meetings: Never   . Marital Status: Married  Human resources officer Violence: Not At Risk (01/19/2022)   Humiliation, Afraid, Rape, and Kick questionnaire   . Fear of Current or Ex-Partner: No   . Emotionally Abused: No   . Physically Abused: No   . Sexually Abused: No    Outpatient Medications Prior to Visit  Medication Sig Dispense Refill  . acetaminophen (TYLENOL) 650 MG CR tablet Take 650-1,300 mg by mouth See admin instructions. Take 1,300 mg by mouth in the morning and 650 mg at bedtime    . albuterol (VENTOLIN HFA) 108 (90 Base) MCG/ACT inhaler INHALE 2 PUFFS INTO THE LUNGS EVERY 6 HOURS AS NEEDED FOR SHORTNESS OF BREATH. (Patient taking differently: Inhale 2 puffs into the lungs every 6 (six) hours as needed for shortness of breath.) 1 each 3  . allopurinol (ZYLOPRIM) 100 MG tablet TAKE 1 TABLET TWICE DAILY (Patient taking differently: Take 100 mg by mouth in the morning and at bedtime.) 180 tablet 0  . antiseptic oral rinse (BIOTENE) LIQD 15 mLs by Mouth Rinse route in the morning.    [name] aspirin EC 81 MG tablet Take 81 mg by mouth at bedtime.    . cycloSPORINE (RESTASIS) 0.05 % ophthalmic emulsion Place 1 drop into both eyes 2 (two) times daily.    . diclofenac Sodium (VOLTAREN) 1 % GEL Apply 2 g topically daily as needed (for arthritic pain in the hands).    . docusate sodium (COLACE) 100 MG capsule Take 1 capsule (100 mg total) by mouth 2 (two)  times daily. 60 capsule 11  . fluocinonide (LIDEX) 0.05 % external solution Apply 1 application  topically 2 (two) times daily as needed for itching (SCALP).    [name] GEMTESA 75 MG TABS Take 75 mg by mouth daily.    [name] glucose blood test strip TRUE Metrix Blood Glucose Test Strip, use as instructed 100 each 12  . Homeopathic Products (THERAWORX RELIEF) FOAM Apply 1 application topically 2 (two) times daily as needed (Pain).    . hydrALAZINE (APRESOLINE) 50 MG tablet Take 1 tablet (50 mg total) by mouth 2 (two) times daily. (Patient taking differently: Take 50 mg by mouth in the morning and at bedtime.) 60 tablet 2  . Lancets MISC 1 each by Does not apply route 2 (two) times daily. TRUE matrix lancets 100 each 3  . levothyroxine (SYNTHROID) 150 MCG tablet Take 150 mcg 6 days a week (patient takes 75 mcg once a week) Disp #84 for 90 day supply (Patient taking differently: Take 150 mcg by mouth See admin instructions. Take 150 mcg by mouth in the morning before breakfast on Mon/Tues/Wed/Thurs/Fri/Sat) 84 tablet 3  . levothyroxine (SYNTHROID) 75 MCG tablet Take 75 mcg once a week (patient on 150 mcg 6 days a week) Dispense #24 for 90 day supply. (Patient taking differently: Take 75 mcg by mouth See admin instructions. Take 75 mcg by mouth in the morning before breakfast on Sundays only) 24 tablet 3  . lip balm (CARMEX) ointment Apply 1 Application topically as needed for lip care or dry skin.    [name] loratadine (CLARITIN) 10 MG tablet Take 10 mg by mouth daily as needed for allergies or rhinitis.    [name] losartan (COZAAR) 100 MG tablet TAKE 1 TABLET EVERY DAY (  Patient taking differently: Take 100 mg by mouth daily after supper.) 90 tablet 1  . Magnesium 500 MG TABS Take 500 mg by mouth daily.    . multivitamin (THERAGRAN) per tablet Take 1 tablet by mouth daily with breakfast.    . nitroGLYCERIN (NITROSTAT) 0.4 MG SL tablet Place 1 tablet (0.4 mg total) under the tongue every 5 (five) minutes as needed for chest  pain. 25 tablet 6  . ondansetron (ZOFRAN-ODT) 4 MG disintegrating tablet Take 1 tablet (4 mg total) by mouth every 8 (eight) hours as needed for nausea or vomiting. (Patient taking differently: Take 4 mg by mouth every 8 (eight) hours as needed for nausea or vomiting (dissolve orally).) 10 tablet 0  . polyethylene glycol (MIRALAX / GLYCOLAX) 17 g packet Take 17-34 g by mouth daily as needed for constipation (mix and drink).    [name] PRESCRIPTION MEDICATION CPAP- At bedtime and during any time of rest    . rosuvastatin (CRESTOR) 5 MG tablet TAKE 1 TABLET EVERY DAY (Patient taking differently: Take 5 mg by mouth daily.) 90 tablet 1  . sodium chloride (OCEAN) 0.65 % nasal spray Place 1 spray into both nostrils at bedtime.    . traMADol (ULTRAM) 50 MG tablet Take 1 tablet (50 mg total) by mouth every 12 (twelve) hours as needed. 10 tablet 0  . vitamin B-12 (CYANOCOBALAMIN) 500 MCG tablet Take 500 mcg by mouth daily.     No facility-administered medications prior to visit.    Allergies  Allergen Reactions  . Pravastatin Other (See Comments)    Muscle cramps  . Sildenafil Other (See Comments)    Tachycardia    . Oxycodone Other (See Comments)    Hallucinations   . Atenolol Other (See Comments)    Bradycardia   . Elastic Bandages & [Zinc] Other (See Comments)    Teflon bandages - Irritation  . Metoclopramide Hcl Anxiety    ROS See HPI    Objective:    Physical Exam Constitutional:      General: He is not in acute distress.    Appearance: He is well-developed.  HENT:     Head: Normocephalic and atraumatic.  Cardiovascular:     Rate and Rhythm: Normal rate and regular rhythm.     Heart sounds: No murmur heard. Pulmonary:     Effort: Pulmonary effort is normal. No respiratory distress.     Breath sounds: Normal breath sounds. No wheezing or rales.  Skin:    General: Skin is warm and dry.  Neurological:     Mental Status: He is alert and oriented to person, place, and time.   Psychiatric:        Behavior: Behavior normal.        Thought Content: Thought content normal.    BP (!) 139/56 (BP Location: Right Arm, Patient Position: Sitting, Cuff Size: Small)   Pulse (!) 58   Temp 98.3 F (36.8 C) (Oral)   Resp 16   Wt 152 lb (68.9 kg)   SpO2 99%   BMI 24.53 kg/m  Wt Readings from Last 3 Encounters:  05/02/22 152 lb (68.9 kg)  04/26/22 148 lb 9.4 oz (67.4 kg)  04/20/22 153 lb (69.4 kg)       Assessment & Plan:   Problem List Items Addressed This Visit       Unprioritized   Hypothyroidism    Lab Results  Component Value Date   TSH 0.76 03/07/2022  TSH WNL, continue current dose of synthroid.  Hand weakness    Secondary to cervical myelopathy and carpal tunnel. He has done OT at [facility] but this is too far. Requests referral closer to home.       Relevant Orders   Ambulatory referral to Occupational Therapy   GI bleed - Primary    Clinically resolved. Check follow up CBC and will arrange follow up with GI.        Relevant Orders   CBC with Differential/Platelet   Ambulatory referral to Gastroenterology   Benign essential hypertension    BP stable on hydralazine and losartan. Continue same.       Acute kidney injury ([place])    Will obtain follow up BUN/Cr.       Relevant Orders   Comp Met (CMET)    I am having Arya B. Snelling "[name]" maintain his multivitamin, cycloSPORINE, sodium chloride, aspirin EC, acetaminophen, Theraworx Relief, glucose blood, Lancets, diclofenac Sodium, fluocinonide, polyethylene glycol, losartan, nitroGLYCERIN, Magnesium, albuterol, rosuvastatin, Gemtesa, ondansetron, levothyroxine, levothyroxine, traMADol, hydrALAZINE, allopurinol, PRESCRIPTION MEDICATION, lip balm, vitamin B-12, loratadine, antiseptic oral rinse, and docusate sodium.  No orders of the defined types were placed in this encounter.

## 2022-05-02 NOTE — Assessment & Plan Note (Signed)
BP stable on hydralazine and losartan. Continue same.

## 2022-05-04 ENCOUNTER — Ambulatory Visit: Payer: Medicare HMO | Admitting: Physical Therapy

## 2022-05-04 ENCOUNTER — Encounter: Payer: Self-pay | Admitting: Gastroenterology

## 2022-05-04 ENCOUNTER — Telehealth: Payer: Self-pay

## 2022-05-04 ENCOUNTER — Telehealth: Payer: Self-pay | Admitting: Adult Health

## 2022-05-04 DIAGNOSIS — R2689 Other abnormalities of gait and mobility: Secondary | ICD-10-CM

## 2022-05-04 DIAGNOSIS — R296 Repeated falls: Secondary | ICD-10-CM

## 2022-05-04 DIAGNOSIS — R2681 Unsteadiness on feet: Secondary | ICD-10-CM | POA: Diagnosis not present

## 2022-05-04 DIAGNOSIS — M6281 Muscle weakness (generalized): Secondary | ICD-10-CM

## 2022-05-04 DIAGNOSIS — M5459 Other low back pain: Secondary | ICD-10-CM | POA: Diagnosis not present

## 2022-05-04 NOTE — Telephone Encounter (Signed)
-----  Message from Thornton Park, MD sent at 05/02/2022 11:46 AM EDT ----- Please arrange office follow-up with Dr. Bryan Lemma or an APP as requested - next available.  Thanks.  KLB ----- Message ----- From: Thornton Park, MD Sent: 05/02/2022  11:45 AM EDT To: [name], NP  Because the bleeding was presumed to be diverticular, we had not recommended GI follow-up without ongoing symptoms. But, I can certainly arrange follow-up with Dr. Bryan Lemma, who will be his [facility] MD if he wishes to follow-up with [place]. He is a former Dr. Allyn Kenner patient. We met him during his hospitalization.  KLB ----- Message ----- From: [name], NP Sent: 05/02/2022  10:47 AM EDT To: Thornton Park, MD  Hi Dr. Tarri Glenn,  Looks like this patient needs hospital GI follow up with you. Can you please ask your staff to reach out to him to schedule? A lot of our GI referrals are not getting call backs and I didn't want him to slip through the cracks.   Thanks,  [employer]

## 2022-05-04 NOTE — Telephone Encounter (Signed)
[employer] pending uploaded notes on the portal  

## 2022-05-04 NOTE — Therapy (Signed)
OUTPATIENT PHYSICAL THERAPY RE-EVALUATION & TREATMENT    Patient Name: [name] Pratt MRN: [id number] DOB:December 03, 1940, 81 y.o., male Today's Date: 05/04/2022      PT End of Session - 05/04/22 1015     Visit Number 13    Number of Visits 27    Date for PT Re-Evaluation 06/29/22    Authorization Type [name]    Authorization Time Period 02/24/22 - 07/18/22    Authorization - Visit Number 12    Authorization - Number of Visits 26    Progress Note Due on Visit 23   Recert completed on visit #13 - 05/04/22   PT Start Time 1015    PT Stop Time 1104    PT Time Calculation (min) 49 min    Activity Tolerance Patient tolerated treatment well;Patient limited by fatigue    Behavior During Therapy [facility] for tasks assessed/performed                Past Medical History:  Diagnosis Date   Allergic rhinitis 02/02/2016   Anemia 12/17/2009   Formatting of this note might be different from the original. 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 Hypertension   Benign paroxysmal positional vertigo 09/14/2021   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HCC) 07/22/2021   Body mass index (BMI) 25.0-25.9, adult 11/02/2020   Cancer (HCC)    skin cancer   Cardiac murmur 07/25/2021   Carotid stenosis    a. Carotid U/S 5/13: LICA < 50%, RICA 50-69%;  b.  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   Cystoid macular edema of both eyes 07/28/2020   Degenerative disc disease, lumbar 06/30/2015   Deviated nasal septum 02/18/2015   Diverticulosis 03/03/2020   DJD (degenerative joint disease) of hip 12/24/2012   Dyspnea 06/12/2011   Emphysema, unspecified (HCC) 03/05/2020   Erectile dysfunction 06/20/2017   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   Gait disorder 03/02/2021   GERD (gastroesophageal reflux disease)    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   Qualifier: Diagnosis of  By: Kem Parkinson     Greater trochanteric pain syndrome 07/22/2021   Hand pain, left 01/16/2022   Hearing loss 02/02/2016   Heme positive stool 02/29/2020   HIATAL HERNIA 05/13/2009   Qualifier: Diagnosis of  By: Kem Parkinson     High risk medication use 01/18/2012   Hip pain 03/02/2021   History of chicken pox    History of COVID-19 12/05/2021   History of hemorrhoids    History of kidney stones    History of skin cancer 07/22/2021   skin cancer   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 Osteoarthritis Of The Hand  10/1 IMO  update   Osteoarthrosis, unspecified whether generalized or localized, pelvic region and thigh 07/25/2010   Formatting of this note might be different from the original. Osteoarthritis Of The Hip   Other abnormal glucose 07/22/2021   Other allergic rhinitis 02/18/2015   Palpitations 07/25/2021   Peripheral neuropathy 06/19/2016   Preventative health care 11/24/2015   Right groin pain 11/29/2012   Right inguinal hernia 05/13/2009   Qualifier: Diagnosis of  By: Burnett Kanaris     RLS (restless legs syndrome) 02/18/2015   Rosacea    Rotator cuff tear 07/27/2013   Situational depression 03/02/2021   Skin lesion 01/16/2022   Sleep apnea with use of continuous positive airway pressure (CPAP) 07/15/2013   CPAP set to  7 cm water,  Residual AHi was 7.8  With no leak.  User time 6 hours.  479 days , not used only 12 days - highly compliant 06-23-13  .    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   Urinary retention 11/26/2020   Ventral hernia 07/08/2012   Vertigo 07/08/2021   Weight loss 03/03/2020   Past Surgical History:  Procedure Laterality Date   ABDOMINAL HERNIA REPAIR  07/15/12   Dr [name]   ANTERIOR CERVICAL DECOMP/DISCECTOMY FUSION N/A 07/09/2020   Procedure: ANTERIOR CERVICAL DECOMPRESSION AND FUSION CERVICAL THREE-FOUR.;  Surgeon: [name], MD;  Location: [place];  Service: Neurosurgery;  Laterality: N/A;  anterior   BROW LIFT  05/07/01   COLONOSCOPY WITH PROPOFOL N/A 04/24/2022   Procedure: COLONOSCOPY WITH PROPOFOL;  Surgeon: Thornton Park, MD;  Location: [place];  Service: Gastroenterology;  Laterality: N/A;   COLONOSCOPY WITH PROPOFOL N/A  04/26/2022   Procedure: COLONOSCOPY WITH PROPOFOL;  Surgeon: Thornton Park, MD;  Location: [place];  Service: Gastroenterology;  Laterality: N/A;   CYSTOSCOPY WITH URETEROSCOPY AND STENT PLACEMENT Right 01/28/2014   Procedure: CYSTOSCOPY WITH URETEROSCOPY, BASKET RETRIVAL AND  STENT PLACEMENT;  Surgeon: [name], MD;  Location: [facility];  Service: Urology;  Laterality: Right;   epidural injections     multiple procedures   ESOPHAGEAL DILATION  1993 and 1994   multiple times   EYE SURGERY Bilateral 03/25/10, 2012   cataract removal   HEMORRHOID SURGERY  2014   HIATAL HERNIA REPAIR  12/21/93   HOLMIUM LASER APPLICATION Right 11/24/1759   Procedure: HOLMIUM LASER APPLICATION;  Surgeon: [name], MD;  Location: [facility];  Service: Urology;  Laterality: Right;   INGUINAL HERNIA REPAIR Right 07/15/12   Dr [name], x2   JOINT REPLACEMENT  07/25/04   right knee   JOINT REPLACEMENT  07/13/10   left knee   KNEE SURGERY  1995   LAPAROSCOPIC CHOLECYSTECTOMY  09/20/06   with intraoperative cholangiogram and right inguinal herniorrhaphy with mesh    LIGAMENT REPAIR Left 07/2013   shoulder   LITHOTRIPSY     x2   NASAL SEPTUM SURGERY  1975   POLYPECTOMY  04/26/2022   Procedure: POLYPECTOMY;  Surgeon: Thornton Park, MD;  Location: [place];  Service: Gastroenterology;;   POSTERIOR CERVICAL FUSION/FORAMINOTOMY N/A 11/24/2020   Procedure: Posterior Cervical Laminectomy Cervical three-four, Cervical four-five with lateral mass fusion;  Surgeon: [name], MD;  Location: [place];  Service: Neurosurgery;  Laterality: N/A;   REFRACTIVE SURGERY Bilateral    Right total hip replacement  4/14   SKIN CANCER DESTRUCTION     nose, ear   TONSILLECTOMY  as child   TOTAL HIP ARTHROPLASTY Left    URETHRAL DILATION  1991   [name]   Patient Active Problem List   Diagnosis Date Noted   Hand weakness 05/02/2022   Acute kidney injury ([place]) 05/02/2022   Adenomatous polyp of ascending colon     Adenomatous polyp of colon    Acute metabolic encephalopathy 20/25/4270   Aspiration pneumonia ([place]) 04/24/2022   GI bleed 04/21/2022   Vasovagal syncope    Hemorrhagic shock (HCC)    Symptomatic anemia    Hematochezia    Acute blood loss anemia    Irregular heart beat 02/03/2022   Constipation 02/03/2022   Carpal tunnel syndrome 01/24/2022   Numbness of hand 01/24/2022   Ulnar neuropathy 01/24/2022   Skin lesion 01/16/2022   History of COVID-19 12/05/2021   Benign paroxysmal positional vertigo 09/14/2021   Palpitations 07/25/2021   Cardiac murmur 07/25/2021   History of chicken pox 07/22/2021   History of hemorrhoids 07/22/2021   Hypertension 07/22/2021   COPD (chronic obstructive pulmonary disease) ([place]) 07/22/2021   History of kidney stones 07/22/2021   History of skin cancer 62/37/6283   Complication of anesthesia 07/22/2021   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[place]) 07/22/2021   Other abnormal glucose 07/22/2021   Spondylitis ([place]) 07/22/2021   Greater trochanteric pain syndrome 07/22/2021   Vertigo 07/08/2021   Hypokalemia 07/08/2021   Trochanteric bursitis of left hip 06/30/2021   Cancer ([place]) 04/11/2021   Lumbar radiculopathy 03/02/2021   Gait instability 03/02/2021   Hip pain 03/02/2021   Situational depression 03/02/2021   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[place]) 02/09/2021   Urinary retention 11/26/2020   Cervical myelopathy ([place]) 11/24/2020   Body mass index (BMI) 25.0-25.9, adult 11/02/2020   Lumbar spondylosis 11/02/2020   Lumbago of lumbar region with sciatica 10/21/2020   Cystoid macular edema of both eyes 07/28/2020   Status post cervical spinal fusion 07/09/2020   History of total bilateral knee replacement 07/08/2020   Aortic atherosclerosis ([place]) 03/05/2020   Emphysema, unspecified ([place]) 03/05/2020   Arthritis 03/04/2020   Diverticulosis 03/03/2020   Internal hemorrhoids 03/03/2020   Weight loss  03/03/2020   Heme positive stool 02/29/2020   Leg cramps 11/13/2019   Tibialis anterior tendon tear, nontraumatic 11/13/2019   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 07/29/2018   CPAP (continuous positive airway pressure) dependence 03/25/2018   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   Erectile dysfunction 06/20/2017   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 03/06/2017   Peripheral neuropathy 06/19/2016   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   Hearing loss  02/02/2016   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   Allergic rhinitis 02/02/2016   Atypical chest pain 11/24/2015   Preventative health care 11/24/2015   Onychomycosis 06/30/2015   Degenerative disc disease, lumbar 06/30/2015   Other allergic rhinitis 02/18/2015   Deviated nasal septum 02/18/2015   RLS (restless legs syndrome) 02/18/2015   GERD (gastroesophageal reflux disease) 09/18/2014   Hyperglycemia 03/03/2014   Rotator cuff tear 07/27/2013   Sleep apnea with use of continuous positive airway pressure (CPAP) 07/15/2013   Carotid stenosis 02/24/2013   Nonspecific abnormal electrocardiogram (ECG) (EKG) 01/06/2013   DJD (degenerative joint disease) of hip 12/24/2012   Lower GI bleed 12/12/2012   Right groin pain 11/29/2012   Ventral hernia 07/08/2012   High risk medication use 01/18/2012   Dyspnea 06/12/2011   Hyperlipidemia with target LDL less than 130 12/27/2010   Osteoarthrosis, unspecified whether generalized or localized, pelvic region and thigh 07/25/2010   Osteoarthrosis, hand 06/13/2010   Anemia 12/17/2009   Hypothyroidism 05/13/2009   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   Benign essential hypertension 05/13/2009   Right inguinal hernia 05/13/2009   HIATAL HERNIA 05/13/2009   NEPHROLITHIASIS 05/13/2009   ROSACEA 05/13/2009    PCP: [name], NP  REFERRING PROVIDER: [name], NP  REFERRING DIAG: R26.81 (ICD-10-CM) - Gait instability   THERAPY DIAG:   Unsteadiness on feet  Other abnormalities of gait and mobility  Muscle weakness (generalized)  Repeated falls  Other low back pain  RATIONALE FOR EVALUATION AND TREATMENT:  Rehabilitation  ONSET DATE:  Fall 01/12/22 (2 falls in past 6 months)  SUBJECTIVE:   SUBJECTIVE STATEMENT: Pt returning to PT after ~1 week hospitalization for GI bleed. He reports he needed to receive 7 pints of blood and feels like he has lost a lot of strength. His most recent hemoglobin on 05/02/22 was 11.1. He reports he has fallen once since returning home while out helping his wife in the garden.  PAIN:  Are you having pain? Yes: NPRS scale:  2/10 Pain location: R low back extending down to back of knee Pain description: aching Aggravating factors: walking Relieving factors: sitting  PERTINENT HISTORY:  Aortic atherosclerosis; carotid stenosis; cervical myelopathy s/p ACDF 06/2020 and posterior cervical fusion 11/2020; peripheral neuropathy; lumbar DDD/spondylosis; lumbago with sciatica/lumbar radiculopathy; OA; B TKA; BTHA; L greater trochanteric pain syndrome; L RCR 2014; recent CTR surgery - early May; hearing loss; HTN; BPPV/vertigo; B sacroiliitis; skin cancer; COPD/emphysema; sleep apnea with CPAP; GERD; gout; HLD; LE cramps; RLS  PRECAUTIONS: None  WEIGHT BEARING RESTRICTIONS: No  FALLS: Has patient fallen in last 6 months? Yes. 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 No acute intracranial abnormality.  2. No acute fracture or traumatic subluxation of the cervical spine. L wrist x-ray: 1.  Mild dorsal wrist soft tissue swelling with small ossific density overlying the proximal carpal row. Correlate for triquetral fracture.  2. Moderate scaphotrapeziotrapezoid and mild first CMC joint osteoarthritis. L elbow x-ray: 1. No acute fracture or dislocation, left elbow.  2. Mild elevation of the anterior fat pad suggestive of a small joint effusion, which may be reactive in the setting of elbow osteoarthritis. L shoulder x-rays: No acute osseous abnormality.  COGNITION: Overall cognitive status: Within functional limits for tasks assessed and Impaired: Memory: Impaired: Short term Procedural   SENSATION: Peripheral neuropathy in B feet  COORDINATION: Mildly impaired with slow movements  EDEMA:  Mild to moderate edema in distal B LE  MUSCLE LENGTH: Hamstrings: mod tight R>L ITB: mod tight B Piriformis: mod tight L>R Hip flexors: mod tight B Quads: mod tight B Heelcord: mod tight B  POSTURE:  rounded shoulders, forward head, and flexed trunk   LOWER EXTREMITY MMT:   Tested in sitting on eval 02/22/22, 03/22/22, 04/17/22 & 05/04/22 MMT Right 02/22/22 Left 02/22/22 Right 03/22/22 Left 03/22/22 Right 04/17/22 Left  04/17/22 Right  05/04/22 Left  05/04/22  Hip flexion 3+ 4- 3+ 4- 4- 4- 3+ 3+  Hip extension 3- 3- 3+ 3+ 3+ 3+ 3 3  Hip abduction 4- 4 4 4+ 4 4+ 4- 4  Hip adduction _0 4+ 4+ 4+ 4 4  Hip internal rotation 3 3+ 3 3+ 3 3+ 3- 3  Hip external rotation 3+ 3+ 3+ 3+ 3+ 3+ 3 3+  Knee flexion 4- 4- 4 4 4+ 4+ 4- 4-  Knee extension 4- 4 4 4+ 4+ 4+ 4 4  Ankle dorsiflexion 4- 4 4- 4 4 4- 4- 4-  Ankle plantarflexion 3+  6 SLS HR 4 10 SLS HR 4+ 20 SLS HR Limited  lift on last few reps 5 20 SLS HR 5- 18 SLS HR 5 20 SLS HR 4- 7 SLS HR Pt noting increased proximal LE pain 4+ 15 SLS HR  Ankle inversion 3+ 4- 3+ 4- 4- 4- 4- 4-  Ankle eversion 3+ 4- 3+ 4- 4- 4- 3+ 3+  (Blank rows = not tested)  BED MOBILITY:  Sit to supine SBA Supine to sit SBA  TRANSFERS: Assistive device utilized: Single point cane and None  Sit to stand: Modified independence Stand to sit: Modified independence Chair to chair: Modified independence Floor:  NT  GAIT: Gait pattern: step through pattern, decreased stride length, decreased hip/knee flexion- Right, decreased hip/knee flexion- Left, decreased trunk rotation, and wide BOS Distance walked: 80 Assistive device utilized: Single point cane Level of assistance: SBA Comments: Gait speed: 2.10 ft/sec with SPC, 2.31 ft/sec w/o AD  RAMP: Level of Assistance:  NT Assistive device utilized:    Ramp Comments:   CURB:  Level of Assistance:  NT Assistive device utilized:    Curb Comments:   STAIRS:  Level of Assistance:  NT  Stair Negotiation Technique:   Number of Stairs:    Height of Stairs:   Comments:   FUNCTIONAL TESTS:  5 times sit to stand: 13.0 sec Timed up and go (TUG): 13.53 sec with SPC; 14.19 sec w/o AD 10 meter walk test: 15.60 sec with SPC; 13.04 sec w/o AD Berg Balance Scale: 39/56 - scores 37-45 indicate significant risk for falls (>80%) Dynamic Gait Index: 11/24; Scores of 19 or less are predictive of falls in older community living adults  PATIENT SURVEYS:  ABC scale 55% of self confidence; 45% impairment  TODAY'S TREATMENT:  05/04/22 THERAPEUTIC ACTIVITIES: 5x STS: 13.38 sec TUG: 14.66 sec with SPC (LOB on turning); 12.81 sec w/o AD (LOB on turning) 10 meter walk test: 11.84 sec with SPC; 10.63 sec w/o AD (more unsteady) Gait speed: 2.77 ft/sec with SPC; 3.09 ft/sec w/o AD Berg Balance Scale: 40/56 - scores 37-45 = significant fall risk  (>80%)  DGI: 12/24; Scores of 19 or less are  predictive of falls in older community living adults MMT Goal assessment   04/20/22 Therapeutic Exercise: Nustep L5x33min STS x 10 no UE support R/L SLS with opp leg staggered 2HA x 15 sec each leg Retro step x 10 bil 2HA Seated hamstring stretch 2x30" Standing hip ABD 2x10/ext x 10 w/ red TB  Sidesteps w/ Red TB 5x10" Step ups w/ 4' step x 10 each LE   04/17/22 THERAPEUTIC EXERCISE: to improve flexibility, strength and mobility.  Verbal and tactile cues throughout for technique. NuStep L6 x 6 min  THERAPEUTIC ACTIVITIES: 5 times sit to stand: 14.63 sec Timed up and go (TUG): 14.28 sec with SPC; 12.90 sec w/o AD 10 meter walk test: 10.96 sec with SPC; 10.43 sec w/o AD Gait speed: 2.99 ft/sec with SPC; 3.14 ft/sec w/o AD Berg Balance Scale: 48/56 - scores 46-51 = moderate fall risk (>50%)  Dynamic Gait Index: 18/24; Scores of 19 or less are predictive of falls in older community living adults MMT Goal assessment   PATIENT EDUCATION: Education details: PT eval findings and ongoing PT POC Person educated: Patient Education method: Explanation Education comprehension: verbalized understanding   HOME EXERCISE PROGRAM: Access Code: [email or web address] URL: [email or web address] Date: 03/22/2022 Prepared by: Annie Paras  Exercises - Standing Gastroc Stretch at Counter  - 1-2 x daily - 7 x weekly - 2-3 reps - 30 sec hold - Standing Soleus Stretch at Counter  - 1-2 x daily - 7 x weekly - 2-3 reps - 30 sec hold - Hip Flexor Stretch on Step  - 1-2 x daily - 7 x weekly - 2-3 reps - 30 sec hold - Side Stepping with Resistance at Ankles and Counter Support  - 1 x daily - 7 x weekly - 2 sets - 10 reps - Forward and Backward Step Over with Counter Support  - 1 x daily - 7 x weekly - 2 sets - 10 reps - Turning in Corner 360  - 1 x daily - 7 x weekly - 2 sets - 5 reps - Tandem Stance in Corner  - 1 x daily - 7 x weekly - 2 reps - 30 sec hold - Single Leg Stance with Support  - 1 x  daily - 7 x weekly - 2 sets - 30 sec hold - Squat with Chair Touch  - 1 x daily - 7 x weekly - 2 sets - 10 reps - 3 sec hold - Seated Hamstring Stretch  - 1-2 x daily - 7 x weekly - 2 sets - 30 sec hold - Seated Hip Adductor Stretch  - 1-2 x daily - 7 x weekly - 3 reps - 30 sec hold - Seated Figure 4 Piriformis Stretch  - 1-2 x daily - 7 x weekly - 2 sets - 30 sec hold - Supine Piriformis Stretch with Foot on Ground  - 1-2 x daily - 7 x weekly - 2-3 reps - 30 sec hold - Supine Lower Trunk Rotation  - 1 x daily - 7 x weekly - 2 sets - 30 sec hold - Bent Knee Fallouts with Alternating  Legs  - 1 x daily - 7 x weekly - 2 sets - 10 reps - 3 sec hold - Seated Hip Abduction with Resistance  - 1 x daily - 3-4 x weekly - 2 sets - 10 reps - 3-5 sec hold - Seated Single Leg Hip Abduction with Resistance  - 1 x daily - 3-4 x weekly - 2 sets - 10 reps - 3 sec hold - Seated March with Resistance  - 1 x daily - 3-4 x weekly - 2 sets - 10 reps - 3 sec hold - Seated Shoulder Row with Anchored Resistance  - 1 x daily - 3-4 x weekly - 2 sets - 10 reps - 3-5 hold hold  Patient Education - Check for Safety   ASSESSMENT:  CLINICAL IMPRESSION: Kamil "[name]" is returning to PT with new PT orders following recent hospitalization 04/21/22 - 04/27/22 for acute blood loss, anemia and hemorrhagic shock secondary to GI bleed. While hospitalized he also experienced acute metabolic encephalopathy and acute kidney injury likely secondary to active bleeding and hypoperfusion which are now normalized/resolved as well as aspiration pneumonia which is now stable off antibiotics. His most recent Hct (RBC) was 3.67 and hemoglobin was 11.1 as of 05/02/22. [name] reports that he feels a lot weaker following the hospitalization and reports one fall while helping his wife in the garden when he tried to step up on a retaining wall. He had significant difficulty getting up following the fall due to weakness but denies any new injuries.  Re-evaluation today revealing declines in the majority of his B LE strength MMT as well as significant declines on Berg and DGI indicating increasing fall risk. Gait speeds and TUG times have not changed as much but increased unsteadiness and LOB observed during testing. Given recent hospitalization and decline in strength, balance functional status, will recommend recert with POC extended for 2x/wk x 7-8 weeks.   OBJECTIVE IMPAIRMENTS Abnormal gait, decreased activity tolerance, decreased balance, decreased endurance, decreased knowledge of condition, decreased knowledge of use of DME, decreased mobility, difficulty walking, decreased ROM, decreased strength, decreased safety awareness, increased fascial restrictions, impaired perceived functional ability, increased muscle spasms, impaired flexibility, improper body mechanics, postural dysfunction, and pain.   ACTIVITY LIMITATIONS carrying, lifting, bending, standing, squatting, stairs, transfers, and locomotion level  PARTICIPATION LIMITATIONS: cleaning, laundry, interpersonal relationship, community activity, and yard work  PERSONAL FACTORS Age, Fitness, Past/current experiences, Time since onset of injury/illness/exacerbation, and 3+ comorbidities: Aortic atherosclerosis; carotid stenosis; cervical myelopathy s/p ACDF 06/2020 and posterior cervical fusion 11/2020; peripheral neuropathy; lumbar DDD/spondylosis; lumbago with sciatica/lumbar radiculopathy; OA; B TKA; BTHA; L greater trochanteric pain syndrome; L RCR 2014; recent CTR surgery - early May; hearing loss; HTN; BPPV/vertigo; B sacroiliitis; skin cancer; COPD/emphysema; sleep apnea with CPAP; GERD; gout; HLD; LE cramps; RLS  are also affecting patient's functional outcome.   REHAB POTENTIAL: Good  CLINICAL DECISION MAKING: Evolving/moderate complexity  EVALUATION COMPLEXITY: Moderate  GOALS: Goals reviewed with patient? Yes  SHORT TERM GOALS: Target date: 03/22/2022, extended to 06/01/22    Patient will be independent with initial HEP. Baseline:  Goal status: MET  03/22/22  2.  Patient will demonstrate decreased fall risk by scoring > 45/56 on Berg. Baseline: 39/56 (eval - 02/22/22); 43/56 (03/22/22); 48/56 (04/17/22); decreased to 40/56 (05/04/22) following hospitalization Goal status: IN PROGRESS  05/04/22 - 40/56  3.  Patient will be educated on strategies to decrease risk of falls.  Baseline:  Goal status: MET  03/22/22 - Education provided last visit -  Pt reports no concerns as he went through the home checklist other than the bathroom mat non-skid backing has disintegrated from repeated washing (he states he pushes it to the side while he is in the BR)   LONG TERM GOALS: Target date: 04/19/2022, extended to 05/29/2022, then 06/29/22   Patient will be independent with advanced/ongoing HEP to improve outcomes and carryover.  Baseline:  Goal status: IN PROGRESS  2.  Patient will be able to ambulate 600' with LRAD with good safety to access community.  Baseline:  Goal status: IN PROGRESS  3.  Patient will be able to step up/down curb safely with LRAD for safety with community ambulation.  Baseline:  Goal status: MET  04/17/22  4.  Patient will demonstrate improved functional LE strength as demonstrated by overall MMT >/= 4 to 4+/5. Baseline:  Goal status: IN PROGRESS  05/04/22 - Declined following hospitalization (refer to MMT chart)  5.  Patient will demonstrate at least 19/24 on DGI to improve gait stability and reduce risk for falls. Baseline: 11/24 (eval); 16/24 (03/31/22); 18/24 (04/17/22); 12/24 (05/04/22) Goal status: IN PROGRESS  05/04/22 - Decreased to 12/24 following hospitalization  6.  Patient will score >/= 52/56 on Berg Balance test to demonstrate lower risk of falls. (MCID= 8 points) .  Baseline: 39/56 (eval - 02/22/22); 43/56 (03/22/22); 48/56 (04/17/22); 40/56 (05/04/22) Goal status: IN PROGRESS  05/04/22 - Decreased to 40/56 following hospitalization  7.  Patient  will report >67% of self confidence on ABC scale to demonstrate improved functional ability. Baseline: 55% self confidence (eval); 53.8 % (04/17/22) Goal status: IN PROGRESS  8.  Patient will demonstrate gait speed of >/= 2.62 ft/sec (0.80 m/s) to be a safe community ambulator with decreased risk for recurrent falls.  Baseline: 2.10 ft/sec with SPC, 2.31 ft/sec w/o AD (eval); 2.90 ft/sec with SPC; 2.92 ft/sec w/o AD (03/31/22); 2.99 ft/sec with SPC; 3.14 ft/sec w/o AD (04/17/22); 2.77 ft/sec with SPC; 3.09 ft/sec w/o AD but much more unsteady (05/04/22) Goal status: MET  03/31/22   PLAN: PT FREQUENCY: 2x/week  PT DURATION: 7-8 weeks  PLANNED INTERVENTIONS: Therapeutic exercises, Therapeutic activity, Neuromuscular re-education, Balance training, Gait training, Patient/Family education, Joint mobilization, Stair training, Vestibular training, Canalith repositioning, Dry Needling, Electrical stimulation, Spinal manipulation, Cryotherapy, Moist heat, Taping, Ultrasound, Ionotophoresis 108m/ml Dexamethasone, Manual therapy, and Re-evaluation  PLAN FOR NEXT SESSION:  progress lumbopelvic/LE flexibility and strengthening; balance training with close guarding; HEP review/update/modification as needed   JPercival Spanish PT 05/04/2022, 11:57 AM

## 2022-05-04 NOTE — Telephone Encounter (Signed)
Called pt and spoke with pt's wife. Pt's wife stated she would have her husband call us back.

## 2022-05-05 ENCOUNTER — Telehealth: Payer: Self-pay | Admitting: Family

## 2022-05-05 NOTE — Telephone Encounter (Signed)
Angie from [facility] states referral need to be changed to just OT, because they do not do PT for the hand.

## 2022-05-08 ENCOUNTER — Ambulatory Visit: Payer: Medicare HMO | Admitting: Adult Health

## 2022-05-08 NOTE — Telephone Encounter (Signed)
Referral is entered as OT. Printed and faxed

## 2022-05-08 NOTE — Telephone Encounter (Signed)
CPAP- [employer] Josem Kaufmann: [phone] (exp. 05/04/22 to 08/02/22).  Patient is scheduled at [facility] for 05/28/22 at 8 pm.  Mailed packet to the patient.

## 2022-05-09 ENCOUNTER — Ambulatory Visit: Payer: Medicare HMO | Admitting: Cardiovascular Disease

## 2022-05-09 ENCOUNTER — Ambulatory Visit: Payer: Medicare HMO

## 2022-05-09 DIAGNOSIS — R2681 Unsteadiness on feet: Secondary | ICD-10-CM

## 2022-05-09 DIAGNOSIS — M5459 Other low back pain: Secondary | ICD-10-CM | POA: Diagnosis not present

## 2022-05-09 DIAGNOSIS — M6281 Muscle weakness (generalized): Secondary | ICD-10-CM

## 2022-05-09 DIAGNOSIS — R2689 Other abnormalities of gait and mobility: Secondary | ICD-10-CM | POA: Diagnosis not present

## 2022-05-09 DIAGNOSIS — R296 Repeated falls: Secondary | ICD-10-CM

## 2022-05-09 NOTE — Therapy (Signed)
OUTPATIENT PHYSICAL THERAPY  TREATMENT    Patient Name: [name] Pratt MRN: [id number] DOB:[birth year], 81 y.o., male Today's Date: 05/09/2022      PT End of Session - 05/09/22 1107     Visit Number 14    Number of Visits 27    Date for PT Re-Evaluation 06/29/22    Authorization Type [name]    Authorization Time Period 02/24/22 - 07/18/22    Authorization - Visit Number 13    Authorization - Number of Visits 26    Progress Note Due on Visit 23   Recert completed on visit #13 - 05/04/22   PT Start Time 1018    PT Stop Time 1100    PT Time Calculation (min) 42 min    Activity Tolerance Patient tolerated treatment well    Behavior During Therapy [facility] for tasks assessed/performed                 Past Medical History:  Diagnosis Date   Allergic rhinitis 02/02/2016   Anemia 12/17/2009   Formatting of this note might be different from the original. Anemia  10/1 IMO update   Aortic atherosclerosis ([place]) 03/05/2020   Arthritis    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   Atypical chest pain 11/24/2015   Benign essential hypertension 05/13/2009   Qualifier: Diagnosis of  By: Larwance Sachs of this note might be different from the original. Hypertension   Benign paroxysmal positional vertigo 09/14/2021   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[place]) 07/22/2021   Body mass index (BMI) 25.0-25.9, adult 11/02/2020   Cancer (HCC)    skin cancer   Cardiac murmur 07/25/2021   Carotid stenosis    a. Carotid U/S 0/93: LICA < 23%, RICA 55-73%;  b.  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   Cystoid macular  edema of both eyes 07/28/2020   Degenerative disc disease, lumbar 06/30/2015   Deviated nasal septum 02/18/2015   Diverticulosis 03/03/2020   DJD (degenerative joint disease) of hip 12/24/2012   Dyspnea 06/12/2011   Emphysema, unspecified ([place]) 03/05/2020   Erectile dysfunction 06/20/2017   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[place]) 02/09/2021   Gait disorder 03/02/2021   GERD (gastroesophageal reflux disease)    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   Qualifier: Diagnosis of  By: Burnett Kanaris     Greater trochanteric pain syndrome 07/22/2021   Hand pain, left 01/16/2022   Hearing loss 02/02/2016   Heme positive stool 02/29/2020   HIATAL HERNIA 05/13/2009   Qualifier: Diagnosis of  By: Burnett Kanaris     High risk medication use 01/18/2012   Hip pain 03/02/2021   History of chicken pox    History of COVID-19 12/05/2021   History of hemorrhoids    History of kidney stones    History of skin cancer 07/22/2021   skin cancer   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 Cardiology - Dr Frances Nickels at [facility] cardiology  ICD-10 cut over  Hypertension    under control   Hypokalemia 07/08/2021   Hypothyroidism    Internal hemorrhoids 03/03/2020   Leg cramps 11/13/2019   Lower GI bleed 12/12/2012   Lumbago of lumbar region with sciatica 10/21/2020   Lumbar radiculopathy 03/02/2021   Lumbar spondylosis 11/02/2020   NEPHROLITHIASIS 05/13/2009   Qualifier: Diagnosis of  By: Burnett Kanaris     Nonspecific abnormal electrocardiogram (ECG) (EKG) 01/06/2013   Numbness of hand 01/24/2022   Onychomycosis 06/30/2015   Osteoarthrosis, 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 Osteoarthritis Of The Hand  10/1 IMO update   Osteoarthrosis, unspecified  whether generalized or localized, pelvic region and thigh 07/25/2010   Formatting of this note might be different from the original. Osteoarthritis Of The Hip   Other abnormal glucose 07/22/2021   Other allergic rhinitis 02/18/2015   Palpitations 07/25/2021   Peripheral neuropathy 06/19/2016   Preventative health care 11/24/2015   Right groin pain 11/29/2012   Right inguinal hernia 05/13/2009   Qualifier: Diagnosis of  By: Burnett Kanaris     RLS (restless legs syndrome) 02/18/2015   Rosacea    Rotator cuff tear 07/27/2013   Situational depression 03/02/2021   Skin lesion 01/16/2022   Sleep apnea with use of continuous positive airway pressure (CPAP) 07/15/2013   CPAP set to  7 cm water,  Residual AHi was 7.8  With no leak.  User time 6 hours.  479 days , not used only 12 days - highly compliant 06-23-13  .    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   Urinary retention 11/26/2020   Ventral hernia 07/08/2012   Vertigo 07/08/2021   Weight loss 03/03/2020   Past Surgical History:  Procedure Laterality Date   ABDOMINAL HERNIA REPAIR  07/15/12   Dr [name]   ANTERIOR CERVICAL DECOMP/DISCECTOMY FUSION N/A 07/09/2020   Procedure: ANTERIOR CERVICAL DECOMPRESSION AND FUSION CERVICAL THREE-FOUR.;  Surgeon: [name], MD;  Location: [place];  Service: Neurosurgery;  Laterality: N/A;  anterior   BROW LIFT  05/07/01   COLONOSCOPY WITH PROPOFOL N/A 04/24/2022   Procedure: COLONOSCOPY WITH PROPOFOL;  Surgeon: Thornton Park, MD;  Location: [place];  Service: Gastroenterology;  Laterality: N/A;   COLONOSCOPY WITH PROPOFOL N/A 04/26/2022   Procedure: COLONOSCOPY  WITH PROPOFOL;  Surgeon: Thornton Park, MD;  Location: [place];  Service: Gastroenterology;  Laterality: N/A;   CYSTOSCOPY WITH URETEROSCOPY AND STENT PLACEMENT Right 01/28/2014   Procedure: CYSTOSCOPY WITH URETEROSCOPY, BASKET RETRIVAL AND  STENT PLACEMENT;  Surgeon: [name], MD;  Location: [facility];  Service: Urology;  Laterality: Right;   epidural injections     multiple procedures   ESOPHAGEAL DILATION  1993 and 1994   multiple times   EYE SURGERY Bilateral 03/25/10, 2012   cataract removal   HEMORRHOID SURGERY  2014   HIATAL HERNIA REPAIR  12/21/93   HOLMIUM LASER APPLICATION Right 2/58/5277   Procedure: HOLMIUM LASER APPLICATION;  Surgeon: [name], MD;  Location: [facility];  Service: Urology;  Laterality: Right;   INGUINAL HERNIA REPAIR Right 07/15/12   Dr [name], x2   JOINT REPLACEMENT  07/25/04   right knee  JOINT REPLACEMENT  07/13/10   left knee   KNEE SURGERY  1995   LAPAROSCOPIC CHOLECYSTECTOMY  09/20/06   with intraoperative cholangiogram and right inguinal herniorrhaphy with mesh    LIGAMENT REPAIR Left 07/2013   shoulder   LITHOTRIPSY     x2   NASAL SEPTUM SURGERY  1975   POLYPECTOMY  04/26/2022   Procedure: POLYPECTOMY;  Surgeon: Thornton Park, MD;  Location: [place];  Service: Gastroenterology;;   POSTERIOR CERVICAL FUSION/FORAMINOTOMY N/A 11/24/2020   Procedure: Posterior Cervical Laminectomy Cervical three-four, Cervical four-five with lateral mass fusion;  Surgeon: [name], MD;  Location: [place];  Service: Neurosurgery;  Laterality: N/A;   REFRACTIVE SURGERY Bilateral    Right total hip replacement  4/14   SKIN CANCER DESTRUCTION     nose, ear   TONSILLECTOMY  as child   TOTAL HIP ARTHROPLASTY Left    URETHRAL DILATION  1991   [name]   Patient Active Problem List   Diagnosis Date Noted   Hand weakness 05/02/2022   Acute kidney injury ([place]) 05/02/2022   Adenomatous polyp of ascending colon    Adenomatous polyp of colon    Acute  metabolic encephalopathy 05/39/7673   Aspiration pneumonia ([place]) 04/24/2022   GI bleed 04/21/2022   Vasovagal syncope    Hemorrhagic shock (HCC)    Symptomatic anemia    Hematochezia    Acute blood loss anemia    Irregular heart beat 02/03/2022   Constipation 02/03/2022   Carpal tunnel syndrome 01/24/2022   Numbness of hand 01/24/2022   Ulnar neuropathy 01/24/2022   Skin lesion 01/16/2022   History of COVID-19 12/05/2021   Benign paroxysmal positional vertigo 09/14/2021   Palpitations 07/25/2021   Cardiac murmur 07/25/2021   History of chicken pox 07/22/2021   History of hemorrhoids 07/22/2021   Hypertension 07/22/2021   COPD (chronic obstructive pulmonary disease) ([place]) 07/22/2021   History of kidney stones 07/22/2021   History of skin cancer 41/93/7902   Complication of anesthesia 07/22/2021   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[place]) 07/22/2021   Other abnormal glucose 07/22/2021   Spondylitis ([place]) 07/22/2021   Greater trochanteric pain syndrome 07/22/2021   Vertigo 07/08/2021   Hypokalemia 07/08/2021   Trochanteric bursitis of left hip 06/30/2021   Cancer ([place]) 04/11/2021   Lumbar radiculopathy 03/02/2021   Gait instability 03/02/2021   Hip pain 03/02/2021   Situational depression 03/02/2021   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[place]) 02/09/2021   Urinary retention 11/26/2020   Cervical myelopathy ([place]) 11/24/2020   Body mass index (BMI) 25.0-25.9, adult 11/02/2020   Lumbar spondylosis 11/02/2020   Lumbago of lumbar region with sciatica 10/21/2020   Cystoid macular edema of both eyes 07/28/2020   Status post cervical spinal fusion 07/09/2020   History of total bilateral knee replacement 07/08/2020   Aortic atherosclerosis ([place]) 03/05/2020   Emphysema, unspecified ([place]) 03/05/2020   Arthritis 03/04/2020   Diverticulosis 03/03/2020   Internal hemorrhoids 03/03/2020   Weight loss 03/03/2020   Heme positive stool  02/29/2020   Leg cramps 11/13/2019   Tibialis anterior tendon tear, nontraumatic 11/13/2019   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 07/29/2018   CPAP (continuous positive airway pressure) dependence 03/25/2018   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   Erectile dysfunction 06/20/2017   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 03/06/2017   Peripheral neuropathy 06/19/2016   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   Hearing loss 02/02/2016   Cranial nerve IV palsy  02/02/2016   Allergic rhinitis 02/02/2016   Atypical chest pain 11/24/2015   Preventative health care 11/24/2015   Onychomycosis 06/30/2015   Degenerative disc disease, lumbar 06/30/2015   Other allergic rhinitis 02/18/2015   Deviated nasal septum 02/18/2015   RLS (restless legs syndrome) 02/18/2015   GERD (gastroesophageal reflux disease) 09/18/2014   Hyperglycemia 03/03/2014   Rotator cuff tear 07/27/2013   Sleep apnea with use of continuous positive airway pressure (CPAP) 07/15/2013   Carotid stenosis 02/24/2013   Nonspecific abnormal electrocardiogram (ECG) (EKG) 01/06/2013   DJD (degenerative joint disease) of hip 12/24/2012   Lower GI bleed 12/12/2012   Right groin pain 11/29/2012   Ventral hernia 07/08/2012   High risk medication use 01/18/2012   Dyspnea 06/12/2011   Hyperlipidemia with target LDL less than 130 12/27/2010   Osteoarthrosis, unspecified whether generalized or localized, pelvic region and thigh 07/25/2010   Osteoarthrosis, hand 06/13/2010   Anemia 12/17/2009   Hypothyroidism 05/13/2009   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   Benign essential hypertension 05/13/2009   Right inguinal hernia 05/13/2009   HIATAL HERNIA 05/13/2009   NEPHROLITHIASIS 05/13/2009   ROSACEA 05/13/2009    PCP: [name], NP  REFERRING PROVIDER: [name], NP  REFERRING DIAG: R26.81 (ICD-10-CM) - Gait instability   THERAPY DIAG:  Unsteadiness on feet  Other abnormalities  of gait and mobility  Muscle weakness (generalized)  Repeated falls  Other low back pain  RATIONALE FOR EVALUATION AND TREATMENT:  Rehabilitation  ONSET DATE:  Fall 01/12/22 (2 falls in past 6 months)  SUBJECTIVE:   SUBJECTIVE STATEMENT: No recent falls, that whole R side is bothersome but not painful. Still feeling weaker d/t hospitalization.  PAIN:  Are you having pain? Yes: NPRS scale: 1/10 Pain location: R low back extending down to back of knee Pain description: aching Aggravating factors: walking Relieving factors: sitting  PERTINENT HISTORY:  Aortic atherosclerosis; carotid stenosis; cervical myelopathy s/p ACDF 06/2020 and posterior cervical fusion 11/2020; peripheral neuropathy; lumbar DDD/spondylosis; lumbago with sciatica/lumbar radiculopathy; OA; B TKA; BTHA; L greater trochanteric pain syndrome; L RCR 2014; recent CTR surgery - early May; hearing loss; HTN; BPPV/vertigo; B sacroiliitis; skin cancer; COPD/emphysema; sleep apnea with CPAP; GERD; gout; HLD; LE cramps; RLS  PRECAUTIONS: None  WEIGHT BEARING RESTRICTIONS: No  FALLS: Has patient fallen in last 6 months? Yes. 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 No acute intracranial abnormality.  2. No  acute fracture or traumatic subluxation of the cervical spine. L wrist x-ray: 1. Mild dorsal wrist soft tissue swelling with small ossific density overlying the proximal carpal row. Correlate for triquetral fracture.  2. Moderate scaphotrapeziotrapezoid and mild first CMC joint osteoarthritis. L elbow x-ray: 1. No acute fracture or dislocation, left elbow.  2. Mild elevation of the anterior  fat pad suggestive of a small joint effusion, which may be reactive in the setting of elbow osteoarthritis. L shoulder x-rays: No acute osseous abnormality.  COGNITION: Overall cognitive status: Within functional limits for tasks assessed and Impaired: Memory: Impaired: Short term Procedural   SENSATION: Peripheral neuropathy in B feet  COORDINATION: Mildly impaired with slow movements  EDEMA:  Mild to moderate edema in distal B LE  MUSCLE LENGTH: Hamstrings: mod tight R>L ITB: mod tight B Piriformis: mod tight L>R Hip flexors: mod tight B Quads: mod tight B Heelcord: mod tight B  POSTURE:  rounded shoulders, forward head, and flexed trunk   LOWER EXTREMITY MMT:   Tested in sitting on eval 02/22/22, 03/22/22, 04/17/22 & 05/04/22 MMT Right 02/22/22 Left 02/22/22 Right 03/22/22 Left 03/22/22 Right 04/17/22 Left  04/17/22 Right  05/04/22 Left  05/04/22  Hip flexion 3+ 4- 3+ 4- 4- 4- 3+ 3+  Hip extension 3- 3- 3+ 3+ 3+ 3+ 3 3  Hip abduction 4- 4 4 4+ 4 4+ 4- 4  Hip adduction _0 4+ 4+ 4+ 4 4  Hip internal rotation 3 3+ 3 3+ 3 3+ 3- 3  Hip external rotation 3+ 3+ 3+ 3+ 3+ 3+ 3 3+  Knee flexion 4- 4- 4 4 4+ 4+ 4- 4-  Knee extension 4- 4 4 4+ 4+ 4+ 4 4  Ankle dorsiflexion 4- 4 4- 4 4 4- 4- 4-  Ankle plantarflexion 3+  6 SLS HR 4 10 SLS HR 4+ 20 SLS HR Limited lift on last few reps 5 20 SLS HR 5- 18 SLS HR 5 20 SLS HR 4- 7 SLS HR Pt noting increased proximal LE pain 4+ 15 SLS HR  Ankle inversion 3+ 4- 3+ 4- 4- 4- 4- 4-  Ankle eversion 3+ 4- 3+ 4- 4- 4- 3+ 3+  (Blank rows = not  tested)  BED MOBILITY:  Sit to supine SBA Supine to sit SBA  TRANSFERS: Assistive device utilized: Single point cane and None  Sit to stand: Modified independence Stand to sit: Modified independence Chair to chair: Modified independence Floor:  NT  GAIT: Gait pattern: step through pattern, decreased stride length, decreased hip/knee flexion- Right, decreased hip/knee flexion- Left, decreased trunk rotation, and wide BOS Distance walked: 80 Assistive device utilized: Single point cane Level of assistance: SBA Comments: Gait speed: 2.10 ft/sec with SPC, 2.31 ft/sec w/o AD  RAMP: Level of Assistance:  NT Assistive device utilized:    Ramp Comments:   CURB:  Level of Assistance:  NT Assistive device utilized:    Curb Comments:   STAIRS:  Level of Assistance:  NT  Stair Negotiation Technique:   Number of Stairs:    Height of Stairs:   Comments:   FUNCTIONAL TESTS:  5 times sit to stand: 13.0 sec Timed up and go (TUG): 13.53 sec with SPC; 14.19 sec w/o AD 10 meter walk test: 15.60 sec with SPC; 13.04 sec w/o AD Berg Balance Scale: 39/56 - scores 37-45 indicate significant risk for falls (>80%) Dynamic Gait Index: 11/24; Scores of 19 or less are predictive of falls in older community living adults  PATIENT SURVEYS:  ABC scale 55% of self confidence; 45% impairment  TODAY'S TREATMENT:  05/09/22 Therapeutic Exercise: Bike L1x1mn Seated LAQ x 10 bil STS x 10 Standing march/hip abd/hip ext 2x10 all with 2# weight at counter Seated flexion stretch with orange pball 5x10" Seated ab set with orange pball 10x with 5 sec hold Seated chop 2x5 with  yellow weight ball Seated chest press with yellow weight ball 2x10  05/04/22 THERAPEUTIC ACTIVITIES: 5x STS: 13.38 sec TUG: 14.66 sec with SPC (LOB on turning); 12.81 sec w/o AD (LOB on turning) 10 meter walk test: 11.84 sec with SPC; 10.63 sec w/o AD (more unsteady) Gait speed: 2.77 ft/sec with SPC; 3.09 ft/sec w/o AD Berg  Balance Scale: 40/56 - scores 37-45 = significant fall risk  (>80%)  DGI: 12/24; Scores of 19 or less are predictive of falls in older community living adults MMT Goal assessment   04/20/22 Therapeutic Exercise: Nustep L5x29mn STS x 10 no UE support R/L SLS with opp leg staggered 2HA x 15 sec each leg Retro step x 10 bil 2HA Seated hamstring stretch 2x30" Standing hip ABD 2x10/ext x 10 w/ red TB  Sidesteps w/ Red TB 5x10" Step ups w/ 4' step x 10 each LE   04/17/22 THERAPEUTIC EXERCISE: to improve flexibility, strength and mobility.  Verbal and tactile cues throughout for technique. NuStep L6 x 6 min  THERAPEUTIC ACTIVITIES: 5 times sit to stand: 14.63 sec Timed up and go (TUG): 14.28 sec with SPC; 12.90 sec w/o AD 10 meter walk test: 10.96 sec with SPC; 10.43 sec w/o AD Gait speed: 2.99 ft/sec with SPC; 3.14 ft/sec w/o AD Berg Balance Scale: 48/56 - scores 46-51 = moderate fall risk (>50%)  Dynamic Gait Index: 18/24; Scores of 19 or less are predictive of falls in older community living adults MMT Goal assessment   PATIENT EDUCATION: Education details: PT eval findings and ongoing PT POC Person educated: Patient Education method: Explanation Education comprehension: verbalized understanding   HOME EXERCISE PROGRAM: Access Code: [email or web address]: [email or web address] Date: 03/22/2022 Prepared by: JAnnie Paras Exercises - Standing Gastroc Stretch at Counter  - 1-2 x daily - 7 x weekly - 2-3 reps - 30 sec hold - Standing Soleus Stretch at Counter  - 1-2 x daily - 7 x weekly - 2-3 reps - 30 sec hold - Hip Flexor Stretch on Step  - 1-2 x daily - 7 x weekly - 2-3 reps - 30 sec hold - Side Stepping with Resistance at Ankles and Counter Support  - 1 x daily - 7 x weekly - 2 sets - 10 reps - Forward and Backward Step Over with Counter Support  - 1 x daily - 7 x weekly - 2 sets - 10 reps - Turning in Corner 360  - 1 x daily - 7 x weekly - 2 sets - 5 reps - Tandem  Stance in Corner  - 1 x daily - 7 x weekly - 2 reps - 30 sec hold - Single Leg Stance with Support  - 1 x daily - 7 x weekly - 2 sets - 30 sec hold - Squat with Chair Touch  - 1 x daily - 7 x weekly - 2 sets - 10 reps - 3 sec hold - Seated Hamstring Stretch  - 1-2 x daily - 7 x weekly - 2 sets - 30 sec hold - Seated Hip Adductor Stretch  - 1-2 x daily - 7 x weekly - 3 reps - 30 sec hold - Seated Figure 4 Piriformis Stretch  - 1-2 x daily - 7 x weekly - 2 sets - 30 sec hold - Supine Piriformis Stretch with Foot on Ground  - 1-2 x daily - 7 x weekly - 2-3 reps - 30 sec hold - Supine Lower Trunk Rotation  - 1 x daily - 7 x weekly -  2 sets - 30 sec hold - Bent Knee Fallouts with Alternating Legs  - 1 x daily - 7 x weekly - 2 sets - 10 reps - 3 sec hold - Seated Hip Abduction with Resistance  - 1 x daily - 3-4 x weekly - 2 sets - 10 reps - 3-5 sec hold - Seated Single Leg Hip Abduction with Resistance  - 1 x daily - 3-4 x weekly - 2 sets - 10 reps - 3 sec hold - Seated March with Resistance  - 1 x daily - 3-4 x weekly - 2 sets - 10 reps - 3 sec hold - Seated Shoulder Row with Anchored Resistance  - 1 x daily - 3-4 x weekly - 2 sets - 10 reps - 3-5 hold hold  Patient Education - Check for Safety   ASSESSMENT:  CLINICAL IMPRESSION: Pt was able to progress through exercises w/o being limited by any pain. He did have reports of fatigue with standing TE and seated chops. He demonstrated more energy this session with less unsteadiness noted and good tolerance for exercises. Pt would continue to benefit from progression core and hip strengthening with some balance exercises to restore function.   OBJECTIVE IMPAIRMENTS Abnormal gait, decreased activity tolerance, decreased balance, decreased endurance, decreased knowledge of condition, decreased knowledge of use of DME, decreased mobility, difficulty walking, decreased ROM, decreased strength, decreased safety awareness, increased fascial restrictions,  impaired perceived functional ability, increased muscle spasms, impaired flexibility, improper body mechanics, postural dysfunction, and pain.   ACTIVITY LIMITATIONS carrying, lifting, bending, standing, squatting, stairs, transfers, and locomotion level  PARTICIPATION LIMITATIONS: cleaning, laundry, interpersonal relationship, community activity, and yard work  PERSONAL FACTORS Age, Fitness, Past/current experiences, Time since onset of injury/illness/exacerbation, and 3+ comorbidities: Aortic atherosclerosis; carotid stenosis; cervical myelopathy s/p ACDF 06/2020 and posterior cervical fusion 11/2020; peripheral neuropathy; lumbar DDD/spondylosis; lumbago with sciatica/lumbar radiculopathy; OA; B TKA; BTHA; L greater trochanteric pain syndrome; L RCR 2014; recent CTR surgery - early May; hearing loss; HTN; BPPV/vertigo; B sacroiliitis; skin cancer; COPD/emphysema; sleep apnea with CPAP; GERD; gout; HLD; LE cramps; RLS  are also affecting patient's functional outcome.   REHAB POTENTIAL: Good  CLINICAL DECISION MAKING: Evolving/moderate complexity  EVALUATION COMPLEXITY: Moderate  GOALS: Goals reviewed with patient? Yes  SHORT TERM GOALS: Target date: 03/22/2022, extended to 06/01/22   Patient will be independent with initial HEP. Baseline:  Goal status: MET  03/22/22  2.  Patient will demonstrate decreased fall risk by scoring > 45/56 on Berg. Baseline: 39/56 (eval - 02/22/22); 43/56 (03/22/22); 48/56 (04/17/22); decreased to 40/56 (05/04/22) following hospitalization Goal status: IN PROGRESS  05/04/22 - 40/56  3.  Patient will be educated on strategies to decrease risk of falls.  Baseline:  Goal status: MET  03/22/22 - Education provided last visit - Pt reports no concerns as he went through the home checklist other than the bathroom mat non-skid backing has disintegrated from repeated washing (he states he pushes it to the side while he is in the BR)   LONG TERM GOALS: Target date: 04/19/2022,  extended to 05/29/2022, then 06/29/22   Patient will be independent with advanced/ongoing HEP to improve outcomes and carryover.  Baseline:  Goal status: IN PROGRESS  2.  Patient will be able to ambulate 600' with LRAD with good safety to access community.  Baseline:  Goal status: IN PROGRESS  3.  Patient will be able to step up/down curb safely with LRAD for safety with community ambulation.  Baseline:  Goal  status: MET  04/17/22  4.  Patient will demonstrate improved functional LE strength as demonstrated by overall MMT >/= 4 to 4+/5. Baseline:  Goal status: IN PROGRESS  05/04/22 - Declined following hospitalization (refer to MMT chart)  5.  Patient will demonstrate at least 19/24 on DGI to improve gait stability and reduce risk for falls. Baseline: 11/24 (eval); 16/24 (03/31/22); 18/24 (04/17/22); 12/24 (05/04/22) Goal status: IN PROGRESS  05/04/22 - Decreased to 12/24 following hospitalization  6.  Patient will score >/= 52/56 on Berg Balance test to demonstrate lower risk of falls. (MCID= 8 points) .  Baseline: 39/56 (eval - 02/22/22); 43/56 (03/22/22); 48/56 (04/17/22); 40/56 (05/04/22) Goal status: IN PROGRESS  05/04/22 - Decreased to 40/56 following hospitalization  7.  Patient will report >67% of self confidence on ABC scale to demonstrate improved functional ability. Baseline: 55% self confidence (eval); 53.8 % (04/17/22) Goal status: IN PROGRESS  8.  Patient will demonstrate gait speed of >/= 2.62 ft/sec (0.80 m/s) to be a safe community ambulator with decreased risk for recurrent falls.  Baseline: 2.10 ft/sec with SPC, 2.31 ft/sec w/o AD (eval); 2.90 ft/sec with SPC; 2.92 ft/sec w/o AD (03/31/22); 2.99 ft/sec with SPC; 3.14 ft/sec w/o AD (04/17/22); 2.77 ft/sec with SPC; 3.09 ft/sec w/o AD but much more unsteady (05/04/22) Goal status: MET  03/31/22   PLAN: PT FREQUENCY: 2x/week  PT DURATION: 7-8 weeks  PLANNED INTERVENTIONS: Therapeutic exercises, Therapeutic activity, Neuromuscular  re-education, Balance training, Gait training, Patient/Family education, Joint mobilization, Stair training, Vestibular training, Canalith repositioning, Dry Needling, Electrical stimulation, Spinal manipulation, Cryotherapy, Moist heat, Taping, Ultrasound, Ionotophoresis 37m/ml Dexamethasone, Manual therapy, and Re-evaluation  PLAN FOR NEXT SESSION:  progress lumbopelvic/LE flexibility and strengthening; balance training with close guarding; HEP review/update/modification as needed   BArtist Pais PTA 05/09/2022, 11:07 AM

## 2022-05-09 NOTE — Telephone Encounter (Signed)
Spoke with [name] Pratt and [name] Pratt stated he had already been scheduled for follow up with Dr. Bryan Lemma on 06/16/22 at 1:20 pm.

## 2022-05-11 ENCOUNTER — Ambulatory Visit: Payer: Medicare HMO | Admitting: Physical Therapy

## 2022-05-11 ENCOUNTER — Encounter: Payer: Self-pay | Admitting: Physical Therapy

## 2022-05-11 DIAGNOSIS — M5459 Other low back pain: Secondary | ICD-10-CM | POA: Diagnosis not present

## 2022-05-11 DIAGNOSIS — R2681 Unsteadiness on feet: Secondary | ICD-10-CM

## 2022-05-11 DIAGNOSIS — R2689 Other abnormalities of gait and mobility: Secondary | ICD-10-CM | POA: Diagnosis not present

## 2022-05-11 DIAGNOSIS — R296 Repeated falls: Secondary | ICD-10-CM

## 2022-05-11 DIAGNOSIS — M6281 Muscle weakness (generalized): Secondary | ICD-10-CM

## 2022-05-11 NOTE — Therapy (Signed)
OUTPATIENT PHYSICAL THERAPY  TREATMENT    Patient Name: [name] Pratt MRN: [id number] DOB:1940/10/14, 81 y.o., male Today's Date: 05/11/2022      PT End of Session - 05/11/22 1015     Visit Number 15    Number of Visits 27    Date for PT Re-Evaluation 06/29/22    Authorization Type [name]    Authorization Time Period 02/24/22 - 07/18/22    Authorization - Visit Number 14    Authorization - Number of Visits 26    Progress Note Due on Visit 23   Recert completed on visit #13 - 05/04/22   PT Start Time 1015    PT Stop Time 1101    PT Time Calculation (min) 46 min    Activity Tolerance Patient tolerated treatment well    Behavior During Therapy [facility] for tasks assessed/performed                  Past Medical History:  Diagnosis Date   Allergic rhinitis 02/02/2016   Anemia 12/17/2009   Formatting of this note might be different from the original. Anemia  10/1 IMO update   Aortic atherosclerosis ([place]) 03/05/2020   Arthritis    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   Atypical chest pain 11/24/2015   Benign essential hypertension 05/13/2009   Qualifier: Diagnosis of  By: Larwance Sachs of this note might be different from the original. Hypertension   Benign paroxysmal positional vertigo 09/14/2021   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[place]) 07/22/2021   Body mass index (BMI) 25.0-25.9, adult 11/02/2020   Cancer ([place])    skin cancer   Cardiac murmur 07/25/2021   Carotid stenosis    a. Carotid U/S 3/97: LICA < 67%, RICA 34-19%;  b.  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   Cystoid macular  edema of both eyes 07/28/2020   Degenerative disc disease, lumbar 06/30/2015   Deviated nasal septum 02/18/2015   Diverticulosis 03/03/2020   DJD (degenerative joint disease) of hip 12/24/2012   Dyspnea 06/12/2011   Emphysema, unspecified ([place]) 03/05/2020   Erectile dysfunction 06/20/2017   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[place]) 02/09/2021   Gait disorder 03/02/2021   GERD (gastroesophageal reflux disease)    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   Qualifier: Diagnosis of  By: Burnett Kanaris     Greater trochanteric pain syndrome 07/22/2021   Hand pain, left 01/16/2022   Hearing loss 02/02/2016   Heme positive stool 02/29/2020   HIATAL HERNIA 05/13/2009   Qualifier: Diagnosis of  By: Burnett Kanaris     High risk medication use 01/18/2012   Hip pain 03/02/2021   History of chicken pox    History of COVID-19 12/05/2021   History of hemorrhoids    History of kidney stones    History of skin cancer 07/22/2021   skin cancer   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 Cardiology - Dr Frances Nickels at [facility] cardiology  ICD-10 cut  over   Hypertension    under control   Hypokalemia 07/08/2021   Hypothyroidism    Internal hemorrhoids 03/03/2020   Leg cramps 11/13/2019   Lower GI bleed 12/12/2012   Lumbago of lumbar region with sciatica 10/21/2020   Lumbar radiculopathy 03/02/2021   Lumbar spondylosis 11/02/2020   NEPHROLITHIASIS 05/13/2009   Qualifier: Diagnosis of  By: Burnett Kanaris     Nonspecific abnormal electrocardiogram (ECG) (EKG) 01/06/2013   Numbness of hand 01/24/2022   Onychomycosis 06/30/2015   Osteoarthrosis, 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 Osteoarthritis Of The Hand  10/1 IMO update   Osteoarthrosis, unspecified  whether generalized or localized, pelvic region and thigh 07/25/2010   Formatting of this note might be different from the original. Osteoarthritis Of The Hip   Other abnormal glucose 07/22/2021   Other allergic rhinitis 02/18/2015   Palpitations 07/25/2021   Peripheral neuropathy 06/19/2016   Preventative health care 11/24/2015   Right groin pain 11/29/2012   Right inguinal hernia 05/13/2009   Qualifier: Diagnosis of  By: Burnett Kanaris     RLS (restless legs syndrome) 02/18/2015   Rosacea    Rotator cuff tear 07/27/2013   Situational depression 03/02/2021   Skin lesion 01/16/2022   Sleep apnea with use of continuous positive airway pressure (CPAP) 07/15/2013   CPAP set to  7 cm water,  Residual AHi was 7.8  With no leak.  User time 6 hours.  479 days , not used only 12 days - highly compliant 06-23-13  .    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   Urinary retention 11/26/2020   Ventral hernia 07/08/2012   Vertigo 07/08/2021   Weight loss 03/03/2020   Past Surgical History:  Procedure Laterality Date   ABDOMINAL HERNIA REPAIR  07/15/12   Dr [name]   ANTERIOR CERVICAL DECOMP/DISCECTOMY FUSION N/A 07/09/2020   Procedure: ANTERIOR CERVICAL DECOMPRESSION AND FUSION CERVICAL THREE-FOUR.;  Surgeon: [name], MD;  Location: [place];  Service: Neurosurgery;  Laterality: N/A;  anterior   BROW LIFT  05/07/01   COLONOSCOPY WITH PROPOFOL N/A 04/24/2022   Procedure: COLONOSCOPY WITH PROPOFOL;  Surgeon: Thornton Park, MD;  Location: [place];  Service: Gastroenterology;  Laterality: N/A;   COLONOSCOPY WITH PROPOFOL N/A 04/26/2022   Procedure: COLONOSCOPY  WITH PROPOFOL;  Surgeon: Thornton Park, MD;  Location: [place];  Service: Gastroenterology;  Laterality: N/A;   CYSTOSCOPY WITH URETEROSCOPY AND STENT PLACEMENT Right 01/28/2014   Procedure: CYSTOSCOPY WITH URETEROSCOPY, BASKET RETRIVAL AND  STENT PLACEMENT;  Surgeon: [name], MD;  Location: [facility];  Service: Urology;  Laterality: Right;   epidural injections     multiple procedures   ESOPHAGEAL DILATION  1993 and 1994   multiple times   EYE SURGERY Bilateral 03/25/10, 2012   cataract removal   HEMORRHOID SURGERY  2014   HIATAL HERNIA REPAIR  12/21/93   HOLMIUM LASER APPLICATION Right 9/32/3557   Procedure: HOLMIUM LASER APPLICATION;  Surgeon: [name], MD;  Location: [facility];  Service: Urology;  Laterality: Right;   INGUINAL HERNIA REPAIR Right 07/15/12   Dr [name], x2   JOINT REPLACEMENT  07/25/04  right knee   JOINT REPLACEMENT  07/13/10   left knee   KNEE SURGERY  1995   LAPAROSCOPIC CHOLECYSTECTOMY  09/20/06   with intraoperative cholangiogram and right inguinal herniorrhaphy with mesh    LIGAMENT REPAIR Left 07/2013   shoulder   LITHOTRIPSY     x2   NASAL SEPTUM SURGERY  1975   POLYPECTOMY  04/26/2022   Procedure: POLYPECTOMY;  Surgeon: Thornton Park, MD;  Location: [place];  Service: Gastroenterology;;   POSTERIOR CERVICAL FUSION/FORAMINOTOMY N/A 11/24/2020   Procedure: Posterior Cervical Laminectomy Cervical three-four, Cervical four-five with lateral mass fusion;  Surgeon: [name], MD;  Location: [place];  Service: Neurosurgery;  Laterality: N/A;   REFRACTIVE SURGERY Bilateral    Right total hip replacement  4/14   SKIN CANCER DESTRUCTION     nose, ear   TONSILLECTOMY  as child   TOTAL HIP ARTHROPLASTY Left    URETHRAL DILATION  1991   [name]   Patient Active Problem List   Diagnosis Date Noted   Hand weakness 05/02/2022   Acute kidney injury ([place]) 05/02/2022   Adenomatous polyp of ascending colon    Adenomatous polyp of colon    Acute  metabolic encephalopathy 14/97/0263   Aspiration pneumonia ([place]) 04/24/2022   GI bleed 04/21/2022   Vasovagal syncope    Hemorrhagic shock (HCC)    Symptomatic anemia    Hematochezia    Acute blood loss anemia    Irregular heart beat 02/03/2022   Constipation 02/03/2022   Carpal tunnel syndrome 01/24/2022   Numbness of hand 01/24/2022   Ulnar neuropathy 01/24/2022   Skin lesion 01/16/2022   History of COVID-19 12/05/2021   Benign paroxysmal positional vertigo 09/14/2021   Palpitations 07/25/2021   Cardiac murmur 07/25/2021   History of chicken pox 07/22/2021   History of hemorrhoids 07/22/2021   Hypertension 07/22/2021   COPD (chronic obstructive pulmonary disease) ([place]) 07/22/2021   History of kidney stones 07/22/2021   History of skin cancer 78/58/8502   Complication of anesthesia 07/22/2021   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[place]) 07/22/2021   Other abnormal glucose 07/22/2021   Spondylitis ([place]) 07/22/2021   Greater trochanteric pain syndrome 07/22/2021   Vertigo 07/08/2021   Hypokalemia 07/08/2021   Trochanteric bursitis of left hip 06/30/2021   Cancer ([place]) 04/11/2021   Lumbar radiculopathy 03/02/2021   Gait instability 03/02/2021   Hip pain 03/02/2021   Situational depression 03/02/2021   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[place]) 02/09/2021   Urinary retention 11/26/2020   Cervical myelopathy ([place]) 11/24/2020   Body mass index (BMI) 25.0-25.9, adult 11/02/2020   Lumbar spondylosis 11/02/2020   Lumbago of lumbar region with sciatica 10/21/2020   Cystoid macular edema of both eyes 07/28/2020   Status post cervical spinal fusion 07/09/2020   History of total bilateral knee replacement 07/08/2020   Aortic atherosclerosis ([place]) 03/05/2020   Emphysema, unspecified ([place]) 03/05/2020   Arthritis 03/04/2020   Diverticulosis 03/03/2020   Internal hemorrhoids 03/03/2020   Weight loss 03/03/2020   Heme positive stool  02/29/2020   Leg cramps 11/13/2019   Tibialis anterior tendon tear, nontraumatic 11/13/2019   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 07/29/2018   CPAP (continuous positive airway pressure) dependence 03/25/2018   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   Erectile dysfunction 06/20/2017   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 03/06/2017   Peripheral neuropathy 06/19/2016   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   Hearing loss 02/02/2016  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   Allergic rhinitis 02/02/2016   Atypical chest pain 11/24/2015   Preventative health care 11/24/2015   Onychomycosis 06/30/2015   Degenerative disc disease, lumbar 06/30/2015   Other allergic rhinitis 02/18/2015   Deviated nasal septum 02/18/2015   RLS (restless legs syndrome) 02/18/2015   GERD (gastroesophageal reflux disease) 09/18/2014   Hyperglycemia 03/03/2014   Rotator cuff tear 07/27/2013   Sleep apnea with use of continuous positive airway pressure (CPAP) 07/15/2013   Carotid stenosis 02/24/2013   Nonspecific abnormal electrocardiogram (ECG) (EKG) 01/06/2013   DJD (degenerative joint disease) of hip 12/24/2012   Lower GI bleed 12/12/2012   Right groin pain 11/29/2012   Ventral hernia 07/08/2012   High risk medication use 01/18/2012   Dyspnea 06/12/2011   Hyperlipidemia with target LDL less than 130 12/27/2010   Osteoarthrosis, unspecified whether generalized or localized, pelvic region and thigh 07/25/2010   Osteoarthrosis, hand 06/13/2010   Anemia 12/17/2009   Hypothyroidism 05/13/2009   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   Benign essential hypertension 05/13/2009   Right inguinal hernia 05/13/2009   HIATAL HERNIA 05/13/2009   NEPHROLITHIASIS 05/13/2009   ROSACEA 05/13/2009    PCP: [name], NP  REFERRING PROVIDER: [name], NP  REFERRING DIAG: R26.81 (ICD-10-CM) - Gait instability   THERAPY DIAG:  Unsteadiness on feet  Other abnormalities  of gait and mobility  Muscle weakness (generalized)  Repeated falls  Other low back pain  RATIONALE FOR EVALUATION AND TREATMENT:  Rehabilitation  ONSET DATE:  Fall 01/12/22 (2 falls in past 6 months)  SUBJECTIVE:   SUBJECTIVE STATEMENT: Pt reports his back still bothers him intermittently but pain is not constant.  PAIN:  Are you having pain? Yes: NPRS scale: intermittently 2/10 Pain location: R low back extending down to back of knee Pain description: aching Aggravating factors: walking Relieving factors: sitting  PERTINENT HISTORY:  Aortic atherosclerosis; carotid stenosis; cervical myelopathy s/p ACDF 06/2020 and posterior cervical fusion 11/2020; peripheral neuropathy; lumbar DDD/spondylosis; lumbago with sciatica/lumbar radiculopathy; OA; B TKA; BTHA; L greater trochanteric pain syndrome; L RCR 2014; recent CTR surgery - early May; hearing loss; HTN; BPPV/vertigo; B sacroiliitis; skin cancer; COPD/emphysema; sleep apnea with CPAP; GERD; gout; HLD; LE cramps; RLS  PRECAUTIONS: None  WEIGHT BEARING RESTRICTIONS: No  FALLS: Has patient fallen in last 6 months? Yes. 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 No acute intracranial abnormality.  2. No acute  fracture or traumatic subluxation of the cervical spine. L wrist x-ray: 1. Mild dorsal wrist soft tissue swelling with small ossific density overlying the proximal carpal row. Correlate for triquetral fracture.  2. Moderate scaphotrapeziotrapezoid and mild first CMC joint osteoarthritis. L elbow x-ray: 1. No acute fracture or dislocation, left elbow.  2. Mild elevation of the  anterior fat pad suggestive of a small joint effusion, which may be reactive in the setting of elbow osteoarthritis. L shoulder x-rays: No acute osseous abnormality.  COGNITION: Overall cognitive status: Within functional limits for tasks assessed and Impaired: Memory: Impaired: Short term Procedural   SENSATION: Peripheral neuropathy in B feet  COORDINATION: Mildly impaired with slow movements  EDEMA:  Mild to moderate edema in distal B LE  MUSCLE LENGTH: Hamstrings: mod tight R>L ITB: mod tight B Piriformis: mod tight L>R Hip flexors: mod tight B Quads: mod tight B Heelcord: mod tight B  POSTURE:  rounded shoulders, forward head, and flexed trunk   LOWER EXTREMITY MMT:   Tested in sitting on eval 02/22/22, 03/22/22, 04/17/22 & 05/04/22 MMT Right 02/22/22 Left 02/22/22 Right 03/22/22 Left 03/22/22 Right 04/17/22 Left  04/17/22 Right  05/04/22 Left  05/04/22  Hip flexion 3+ 4- 3+ 4- 4- 4- 3+ 3+  Hip extension 3- 3- 3+ 3+ 3+ 3+ 3 3  Hip abduction 4- 4 4 4+ 4 4+ 4- 4  Hip adduction _0 4+ 4+ 4+ 4 4  Hip internal rotation 3 3+ 3 3+ 3 3+ 3- 3  Hip external rotation 3+ 3+ 3+ 3+ 3+ 3+ 3 3+  Knee flexion 4- 4- 4 4 4+ 4+ 4- 4-  Knee extension 4- 4 4 4+ 4+ 4+ 4 4  Ankle dorsiflexion 4- 4 4- 4 4 4- 4- 4-  Ankle plantarflexion 3+  6 SLS HR 4 10 SLS HR 4+ 20 SLS HR Limited lift on last few reps 5 20 SLS HR 5- 18 SLS HR 5 20 SLS HR 4- 7 SLS HR Pt noting increased proximal LE pain 4+ 15 SLS HR  Ankle inversion 3+ 4- 3+ 4- 4- 4- 4- 4-  Ankle eversion 3+ 4- 3+ 4- 4- 4- 3+ 3+  (Blank rows = not tested)  BED  MOBILITY:  Sit to supine SBA Supine to sit SBA  TRANSFERS: Assistive device utilized: Single point cane and None  Sit to stand: Modified independence Stand to sit: Modified independence Chair to chair: Modified independence Floor:  NT  GAIT: Gait pattern: step through pattern, decreased stride length, decreased hip/knee flexion- Right, decreased hip/knee flexion- Left, decreased trunk rotation, and wide BOS Distance walked: 80 Assistive device utilized: Single point cane Level of assistance: SBA Comments: Gait speed: 2.10 ft/sec with SPC, 2.31 ft/sec w/o AD  RAMP: Level of Assistance:  NT Assistive device utilized:    Ramp Comments:   CURB:  Level of Assistance:  NT Assistive device utilized:    Curb Comments:   STAIRS:  Level of Assistance:  NT  Stair Negotiation Technique:   Number of Stairs:    Height of Stairs:   Comments:   FUNCTIONAL TESTS:  5 times sit to stand: 13.0 sec Timed up and go (TUG): 13.53 sec with SPC; 14.19 sec w/o AD 10 meter walk test: 15.60 sec with SPC; 13.04 sec w/o AD Berg Balance Scale: 39/56 - scores 37-45 indicate significant risk for falls (>80%) Dynamic Gait Index: 11/24; Scores of 19 or less are predictive of falls in older community living adults  PATIENT SURVEYS:  ABC scale 55% of self confidence; 45% impairment  TODAY'S TREATMENT:   05/11/22 THERAPEUTIC EXERCISE: to improve flexibility, strength and mobility.  Verbal and tactile cues throughout for technique. NuStep - L5 x 6 min (UE/LE) Seated on dynadisc with feet on Airex pad:  - RTB rows 10 x 3", 2 sets - cues to  avoid excess shoulder extension and focus on scap and core/abdominal muscle activation  - RTB B scap retraction + shoulder extension to neutral 10 x 3", 2 sets - cues to slow pace and pause to increase muscle activation  - March with 2# ankle cuff wts x 10  - Alt LAQ with 1# cuff wt on R, 2# on L x 10 each  - Bil RTB pallof press x 10 each side  - Hip ADD ball  squeeze + alt hip IR with YTB at ankles x 10  - Alt hip ABD/ER unilateral clam with YTB at knees 2 x 10  NEUROMUSCULAR RE-EDUCATION: To improve balance, proprioception, coordination, and reduce fall risk. Standing on Airex pad with CGA/SBA of PT:  - Heel-toe weight shift x 10  - Lateral weight shift x 10  - Step in place x 10  - Alt toe clears to 9" stool x 5  - Bean bag reach and toss 2 x 12, 1 set each with R and L UE   05/09/22 Therapeutic Exercise: Bike L1x39mn Seated LAQ x 10 bil STS x 10 Standing march/hip abd/hip ext 2x10 all with 2# weight at counter Seated flexion stretch with orange pball 5x10" Seated ab set with orange pball 10x with 5 sec hold Seated chop 2x5 with yellow weight ball Seated chest press with yellow weight ball 2x10   05/04/22 THERAPEUTIC ACTIVITIES: 5x STS: 13.38 sec TUG: 14.66 sec with SPC (LOB on turning); 12.81 sec w/o AD (LOB on turning) 10 meter walk test: 11.84 sec with SPC; 10.63 sec w/o AD (more unsteady) Gait speed: 2.77 ft/sec with SPC; 3.09 ft/sec w/o AD Berg Balance Scale: 40/56 - scores 37-45 = significant fall risk  (>80%)  DGI: 12/24; Scores of 19 or less are predictive of falls in older community living adults MMT Goal assessment   PATIENT EDUCATION: Education details: ongoing PT POC Person educated: Patient Education method: Explanation Education comprehension: verbalized understanding   HOME EXERCISE PROGRAM: Access Code: [email or web address]: [email or web address] Date: 03/22/2022 Prepared by: JAnnie Paras Exercises - Standing Gastroc Stretch at Counter  - 1-2 x daily - 7 x weekly - 2-3 reps - 30 sec hold - Standing Soleus Stretch at Counter  - 1-2 x daily - 7 x weekly - 2-3 reps - 30 sec hold - Hip Flexor Stretch on Step  - 1-2 x daily - 7 x weekly - 2-3 reps - 30 sec hold - Side Stepping with Resistance at Ankles and Counter Support  - 1 x daily - 7 x weekly - 2 sets - 10 reps - Forward and Backward Step Over  with Counter Support  - 1 x daily - 7 x weekly - 2 sets - 10 reps - Turning in Corner 360  - 1 x daily - 7 x weekly - 2 sets - 5 reps - Tandem Stance in Corner  - 1 x daily - 7 x weekly - 2 reps - 30 sec hold - Single Leg Stance with Support  - 1 x daily - 7 x weekly - 2 sets - 30 sec hold - Squat with Chair Touch  - 1 x daily - 7 x weekly - 2 sets - 10 reps - 3 sec hold - Seated Hamstring Stretch  - 1-2 x daily - 7 x weekly - 2 sets - 30 sec hold - Seated Hip Adductor Stretch  - 1-2 x daily - 7 x weekly - 3 reps - 30 sec hold - Seated Figure 4  Piriformis Stretch  - 1-2 x daily - 7 x weekly - 2 sets - 30 sec hold - Supine Piriformis Stretch with Foot on Ground  - 1-2 x daily - 7 x weekly - 2-3 reps - 30 sec hold - Supine Lower Trunk Rotation  - 1 x daily - 7 x weekly - 2 sets - 30 sec hold - Bent Knee Fallouts with Alternating Legs  - 1 x daily - 7 x weekly - 2 sets - 10 reps - 3 sec hold - Seated Hip Abduction with Resistance  - 1 x daily - 3-4 x weekly - 2 sets - 10 reps - 3-5 sec hold - Seated Single Leg Hip Abduction with Resistance  - 1 x daily - 3-4 x weekly - 2 sets - 10 reps - 3 sec hold - Seated March with Resistance  - 1 x daily - 3-4 x weekly - 2 sets - 10 reps - 3 sec hold - Seated Shoulder Row with Anchored Resistance  - 1 x daily - 3-4 x weekly - 2 sets - 10 reps - 3-5 hold hold  Patient Education - Check for Safety   ASSESSMENT:  CLINICAL IMPRESSION: Jalin "[name]"  reports his energy level and endurance remains limited since his hospitalization, and is more easily fatigued with activity and exercises. Challenged balance and core stability/activation during seated postural and LE exercises today, having patient sit on dynadisc with feet on Airex pad - occasional slight LOB but patient able to independently recover. Closer guarding necessary during standing balance activities on Airex pad with CGA/SBA necessary most of the time although balance improving as he became more  acclimated to the unstable surface.   OBJECTIVE IMPAIRMENTS Abnormal gait, decreased activity tolerance, decreased balance, decreased endurance, decreased knowledge of condition, decreased knowledge of use of DME, decreased mobility, difficulty walking, decreased ROM, decreased strength, decreased safety awareness, increased fascial restrictions, impaired perceived functional ability, increased muscle spasms, impaired flexibility, improper body mechanics, postural dysfunction, and pain.   ACTIVITY LIMITATIONS carrying, lifting, bending, standing, squatting, stairs, transfers, and locomotion level  PARTICIPATION LIMITATIONS: cleaning, laundry, interpersonal relationship, community activity, and yard work  PERSONAL FACTORS Age, Fitness, Past/current experiences, Time since onset of injury/illness/exacerbation, and 3+ comorbidities: Aortic atherosclerosis; carotid stenosis; cervical myelopathy s/p ACDF 06/2020 and posterior cervical fusion 11/2020; peripheral neuropathy; lumbar DDD/spondylosis; lumbago with sciatica/lumbar radiculopathy; OA; B TKA; BTHA; L greater trochanteric pain syndrome; L RCR 2014; recent CTR surgery - early May; hearing loss; HTN; BPPV/vertigo; B sacroiliitis; skin cancer; COPD/emphysema; sleep apnea with CPAP; GERD; gout; HLD; LE cramps; RLS  are also affecting patient's functional outcome.   REHAB POTENTIAL: Good  CLINICAL DECISION MAKING: Evolving/moderate complexity  EVALUATION COMPLEXITY: Moderate  GOALS: Goals reviewed with patient? Yes  SHORT TERM GOALS: Target date: 03/22/2022, extended to 06/01/22   Patient will be independent with initial HEP. Baseline:  Goal status: MET  03/22/22  2.  Patient will demonstrate decreased fall risk by scoring > 45/56 on Berg. Baseline: 39/56 (eval - 02/22/22); 43/56 (03/22/22); 48/56 (04/17/22); decreased to 40/56 (05/04/22) following hospitalization Goal status: IN PROGRESS  05/04/22 - 40/56  3.  Patient will be educated on strategies  to decrease risk of falls.  Baseline:  Goal status: MET  03/22/22 - Education provided last visit - Pt reports no concerns as he went through the home checklist other than the bathroom mat non-skid backing has disintegrated from repeated washing (he states he pushes it to the side while he is in the  BR)   LONG TERM GOALS: Target date: 04/19/2022, extended to 05/29/2022, then 06/29/22   Patient will be independent with advanced/ongoing HEP to improve outcomes and carryover.  Baseline:  Goal status: IN PROGRESS  2.  Patient will be able to ambulate 600' with LRAD with good safety to access community.  Baseline:  Goal status: IN PROGRESS  3.  Patient will be able to step up/down curb safely with LRAD for safety with community ambulation.  Baseline:  Goal status: MET  04/17/22  4.  Patient will demonstrate improved functional LE strength as demonstrated by overall MMT >/= 4 to 4+/5. Baseline:  Goal status: IN PROGRESS  05/04/22 - Declined following hospitalization (refer to MMT chart)  5.  Patient will demonstrate at least 19/24 on DGI to improve gait stability and reduce risk for falls. Baseline: 11/24 (eval); 16/24 (03/31/22); 18/24 (04/17/22); 12/24 (05/04/22) Goal status: IN PROGRESS  05/04/22 - Decreased to 12/24 following hospitalization  6.  Patient will score >/= 52/56 on Berg Balance test to demonstrate lower risk of falls. (MCID= 8 points) .  Baseline: 39/56 (eval - 02/22/22); 43/56 (03/22/22); 48/56 (04/17/22); 40/56 (05/04/22) Goal status: IN PROGRESS  05/04/22 - Decreased to 40/56 following hospitalization  7.  Patient will report >67% of self confidence on ABC scale to demonstrate improved functional ability. Baseline: 55% self confidence (eval); 53.8 % (04/17/22) Goal status: IN PROGRESS  8.  Patient will demonstrate gait speed of >/= 2.62 ft/sec (0.80 m/s) to be a safe community ambulator with decreased risk for recurrent falls.  Baseline: 2.10 ft/sec with SPC, 2.31 ft/sec w/o AD (eval);  2.90 ft/sec with SPC; 2.92 ft/sec w/o AD (03/31/22); 2.99 ft/sec with SPC; 3.14 ft/sec w/o AD (04/17/22); 2.77 ft/sec with SPC; 3.09 ft/sec w/o AD but much more unsteady (05/04/22) Goal status: MET  03/31/22   PLAN: PT FREQUENCY: 2x/week  PT DURATION: 7-8 weeks  PLANNED INTERVENTIONS: Therapeutic exercises, Therapeutic activity, Neuromuscular re-education, Balance training, Gait training, Patient/Family education, Joint mobilization, Stair training, Vestibular training, Canalith repositioning, Dry Needling, Electrical stimulation, Spinal manipulation, Cryotherapy, Moist heat, Taping, Ultrasound, Ionotophoresis 88m/ml Dexamethasone, Manual therapy, and Re-evaluation  PLAN FOR NEXT SESSION:  progress lumbopelvic/LE flexibility and strengthening; balance training with close guarding; HEP review/update/modification as needed   JPercival Spanish PT 05/11/2022, 12:36 PM

## 2022-05-18 ENCOUNTER — Ambulatory Visit: Payer: Medicare HMO | Attending: Family | Admitting: Physical Therapy

## 2022-05-18 ENCOUNTER — Encounter: Payer: Self-pay | Admitting: Physical Therapy

## 2022-05-18 DIAGNOSIS — M5459 Other low back pain: Secondary | ICD-10-CM | POA: Diagnosis not present

## 2022-05-18 DIAGNOSIS — R2689 Other abnormalities of gait and mobility: Secondary | ICD-10-CM

## 2022-05-18 DIAGNOSIS — R296 Repeated falls: Secondary | ICD-10-CM | POA: Diagnosis not present

## 2022-05-18 DIAGNOSIS — R2681 Unsteadiness on feet: Secondary | ICD-10-CM | POA: Diagnosis not present

## 2022-05-18 DIAGNOSIS — M6281 Muscle weakness (generalized): Secondary | ICD-10-CM

## 2022-05-18 NOTE — Therapy (Addendum)
OUTPATIENT PHYSICAL THERAPY  TREATMENT    Patient Name: ABDULRAHMAN BRACEY MRN: [id number] DOB:Dec 19, 1940, 81 y.o., male Today's Date: 05/18/2022      PT End of Session - 05/18/22 1018     Visit Number 16    Number of Visits 27    Date for PT Re-Evaluation 06/29/22    Authorization Type [name]    Authorization Time Period 02/24/22 - 07/18/22    Authorization - Visit Number 15    Authorization - Number of Visits 26    Progress Note Due on Visit 23   Recert completed on visit #13 - 05/04/22   PT Start Time 1018    PT Stop Time 1100    PT Time Calculation (min) 42 min    Activity Tolerance Patient tolerated treatment well    Behavior During Therapy [facility] for tasks assessed/performed                   Past Medical History:  Diagnosis Date   Allergic rhinitis 02/02/2016   Anemia 12/17/2009   Formatting of this note might be different from the original. Anemia  10/1 IMO update   Aortic atherosclerosis ([place]) 03/05/2020   Arthritis    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   Atypical chest pain 11/24/2015   Benign essential hypertension 05/13/2009   Qualifier: Diagnosis of  By: Larwance Sachs of this note might be different from the original. Hypertension   Benign paroxysmal positional vertigo 09/14/2021   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[place]) 07/22/2021   Body mass index (BMI) 25.0-25.9, adult 11/02/2020   Cancer (HCC)    skin cancer   Cardiac murmur 07/25/2021   Carotid stenosis    a. Carotid U/S 5/09: LICA < 32%, RICA 67-12%;  b.  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   Cystoid macular  edema of both eyes 07/28/2020   Degenerative disc disease, lumbar 06/30/2015   Deviated nasal septum 02/18/2015   Diverticulosis 03/03/2020   DJD (degenerative joint disease) of hip 12/24/2012   Dyspnea 06/12/2011   Emphysema, unspecified ([place]) 03/05/2020   Erectile dysfunction 06/20/2017   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[place]) 02/09/2021   Gait disorder 03/02/2021   GERD (gastroesophageal reflux disease)    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   Qualifier: Diagnosis of  By: Burnett Kanaris     Greater trochanteric pain syndrome 07/22/2021   Hand pain, left 01/16/2022   Hearing loss 02/02/2016   Heme positive stool 02/29/2020   HIATAL HERNIA 05/13/2009   Qualifier: Diagnosis of  By: Burnett Kanaris     High risk medication use 01/18/2012   Hip pain 03/02/2021   History of chicken pox    History of COVID-19 12/05/2021   History of hemorrhoids    History of kidney stones    History of skin cancer 07/22/2021   skin cancer   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 Cardiology - Dr Frances Nickels at [facility] cardiology  ICD-10  cut over   Hypertension    under control   Hypokalemia 07/08/2021   Hypothyroidism    Internal hemorrhoids 03/03/2020   Leg cramps 11/13/2019   Lower GI bleed 12/12/2012   Lumbago of lumbar region with sciatica 10/21/2020   Lumbar radiculopathy 03/02/2021   Lumbar spondylosis 11/02/2020   NEPHROLITHIASIS 05/13/2009   Qualifier: Diagnosis of  By: Burnett Kanaris     Nonspecific abnormal electrocardiogram (ECG) (EKG) 01/06/2013   Numbness of hand 01/24/2022   Onychomycosis 06/30/2015   Osteoarthrosis, 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 Osteoarthritis Of The Hand  10/1 IMO update   Osteoarthrosis, unspecified  whether generalized or localized, pelvic region and thigh 07/25/2010   Formatting of this note might be different from the original. Osteoarthritis Of The Hip   Other abnormal glucose 07/22/2021   Other allergic rhinitis 02/18/2015   Palpitations 07/25/2021   Peripheral neuropathy 06/19/2016   Preventative health care 11/24/2015   Right groin pain 11/29/2012   Right inguinal hernia 05/13/2009   Qualifier: Diagnosis of  By: Burnett Kanaris     RLS (restless legs syndrome) 02/18/2015   Rosacea    Rotator cuff tear 07/27/2013   Situational depression 03/02/2021   Skin lesion 01/16/2022   Sleep apnea with use of continuous positive airway pressure (CPAP) 07/15/2013   CPAP set to  7 cm water,  Residual AHi was 7.8  With no leak.  User time 6 hours.  479 days , not used only 12 days - highly compliant 06-23-13  .    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   Urinary retention 11/26/2020   Ventral hernia 07/08/2012   Vertigo 07/08/2021   Weight loss 03/03/2020   Past Surgical History:  Procedure Laterality Date   ABDOMINAL HERNIA REPAIR  07/15/12   Dr [name]   ANTERIOR CERVICAL DECOMP/DISCECTOMY FUSION N/A 07/09/2020   Procedure: ANTERIOR CERVICAL DECOMPRESSION AND FUSION CERVICAL THREE-FOUR.;  Surgeon: [name], MD;  Location: [place];  Service: Neurosurgery;  Laterality: N/A;  anterior   BROW LIFT  05/07/01   COLONOSCOPY WITH PROPOFOL N/A 04/24/2022   Procedure: COLONOSCOPY WITH PROPOFOL;  Surgeon: Thornton Park, MD;  Location: [place];  Service: Gastroenterology;  Laterality: N/A;   COLONOSCOPY WITH PROPOFOL N/A 04/26/2022   Procedure: COLONOSCOPY  WITH PROPOFOL;  Surgeon: Thornton Park, MD;  Location: [place];  Service: Gastroenterology;  Laterality: N/A;   CYSTOSCOPY WITH URETEROSCOPY AND STENT PLACEMENT Right 01/28/2014   Procedure: CYSTOSCOPY WITH URETEROSCOPY, BASKET RETRIVAL AND  STENT PLACEMENT;  Surgeon: [name], MD;  Location: [facility];  Service: Urology;  Laterality: Right;   epidural injections     multiple procedures   ESOPHAGEAL DILATION  1993 and 1994   multiple times   EYE SURGERY Bilateral 03/25/10, 2012   cataract removal   HEMORRHOID SURGERY  2014   HIATAL HERNIA REPAIR  12/21/93   HOLMIUM LASER APPLICATION Right 9/52/8413   Procedure: HOLMIUM LASER APPLICATION;  Surgeon: [name], MD;  Location: [facility];  Service: Urology;  Laterality: Right;   INGUINAL HERNIA REPAIR Right 07/15/12   Dr [name], x2   JOINT REPLACEMENT  07/25/04  right knee   JOINT REPLACEMENT  07/13/10   left knee   KNEE SURGERY  1995   LAPAROSCOPIC CHOLECYSTECTOMY  09/20/06   with intraoperative cholangiogram and right inguinal herniorrhaphy with mesh    LIGAMENT REPAIR Left 07/2013   shoulder   LITHOTRIPSY     x2   NASAL SEPTUM SURGERY  1975   POLYPECTOMY  04/26/2022   Procedure: POLYPECTOMY;  Surgeon: Thornton Park, MD;  Location: [place];  Service: Gastroenterology;;   POSTERIOR CERVICAL FUSION/FORAMINOTOMY N/A 11/24/2020   Procedure: Posterior Cervical Laminectomy Cervical three-four, Cervical four-five with lateral mass fusion;  Surgeon: [name], MD;  Location: [place];  Service: Neurosurgery;  Laterality: N/A;   REFRACTIVE SURGERY Bilateral    Right total hip replacement  4/14   SKIN CANCER DESTRUCTION     nose, ear   TONSILLECTOMY  as child   TOTAL HIP ARTHROPLASTY Left    URETHRAL DILATION  1991   [name]   Patient Active Problem List   Diagnosis Date Noted   Hand weakness 05/02/2022   Acute kidney injury ([place]) 05/02/2022   Adenomatous polyp of ascending colon    Adenomatous polyp of colon    Acute  metabolic encephalopathy 14/97/0263   Aspiration pneumonia ([place]) 04/24/2022   GI bleed 04/21/2022   Vasovagal syncope    Hemorrhagic shock (HCC)    Symptomatic anemia    Hematochezia    Acute blood loss anemia    Irregular heart beat 02/03/2022   Constipation 02/03/2022   Carpal tunnel syndrome 01/24/2022   Numbness of hand 01/24/2022   Ulnar neuropathy 01/24/2022   Skin lesion 01/16/2022   History of COVID-19 12/05/2021   Benign paroxysmal positional vertigo 09/14/2021   Palpitations 07/25/2021   Cardiac murmur 07/25/2021   History of chicken pox 07/22/2021   History of hemorrhoids 07/22/2021   Hypertension 07/22/2021   COPD (chronic obstructive pulmonary disease) ([place]) 07/22/2021   History of kidney stones 07/22/2021   History of skin cancer 78/58/8502   Complication of anesthesia 07/22/2021   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[place]) 07/22/2021   Other abnormal glucose 07/22/2021   Spondylitis ([place]) 07/22/2021   Greater trochanteric pain syndrome 07/22/2021   Vertigo 07/08/2021   Hypokalemia 07/08/2021   Trochanteric bursitis of left hip 06/30/2021   Cancer ([place]) 04/11/2021   Lumbar radiculopathy 03/02/2021   Gait instability 03/02/2021   Hip pain 03/02/2021   Situational depression 03/02/2021   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[place]) 02/09/2021   Urinary retention 11/26/2020   Cervical myelopathy ([place]) 11/24/2020   Body mass index (BMI) 25.0-25.9, adult 11/02/2020   Lumbar spondylosis 11/02/2020   Lumbago of lumbar region with sciatica 10/21/2020   Cystoid macular edema of both eyes 07/28/2020   Status post cervical spinal fusion 07/09/2020   History of total bilateral knee replacement 07/08/2020   Aortic atherosclerosis ([place]) 03/05/2020   Emphysema, unspecified ([place]) 03/05/2020   Arthritis 03/04/2020   Diverticulosis 03/03/2020   Internal hemorrhoids 03/03/2020   Weight loss 03/03/2020   Heme positive stool  02/29/2020   Leg cramps 11/13/2019   Tibialis anterior tendon tear, nontraumatic 11/13/2019   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 07/29/2018   CPAP (continuous positive airway pressure) dependence 03/25/2018   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   Erectile dysfunction 06/20/2017   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 03/06/2017   Peripheral neuropathy 06/19/2016   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   Hearing loss 02/02/2016  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   Allergic rhinitis 02/02/2016   Atypical chest pain 11/24/2015   Preventative health care 11/24/2015   Onychomycosis 06/30/2015   Degenerative disc disease, lumbar 06/30/2015   Other allergic rhinitis 02/18/2015   Deviated nasal septum 02/18/2015   RLS (restless legs syndrome) 02/18/2015   GERD (gastroesophageal reflux disease) 09/18/2014   Hyperglycemia 03/03/2014   Rotator cuff tear 07/27/2013   Sleep apnea with use of continuous positive airway pressure (CPAP) 07/15/2013   Carotid stenosis 02/24/2013   Nonspecific abnormal electrocardiogram (ECG) (EKG) 01/06/2013   DJD (degenerative joint disease) of hip 12/24/2012   Lower GI bleed 12/12/2012   Right groin pain 11/29/2012   Ventral hernia 07/08/2012   High risk medication use 01/18/2012   Dyspnea 06/12/2011   Hyperlipidemia with target LDL less than 130 12/27/2010   Osteoarthrosis, unspecified whether generalized or localized, pelvic region and thigh 07/25/2010   Osteoarthrosis, hand 06/13/2010   Anemia 12/17/2009   Hypothyroidism 05/13/2009   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   Benign essential hypertension 05/13/2009   Right inguinal hernia 05/13/2009   HIATAL HERNIA 05/13/2009   NEPHROLITHIASIS 05/13/2009   ROSACEA 05/13/2009    PCP: [name], NP  REFERRING PROVIDER: [name], NP  REFERRING DIAG: R26.81 (ICD-10-CM) - Gait instability   THERAPY DIAG:  Unsteadiness on feet  Other abnormalities  of gait and mobility  Muscle weakness (generalized)  Repeated falls  Other low back pain  RATIONALE FOR EVALUATION AND TREATMENT:  Rehabilitation  ONSET DATE:  Fall 01/12/22 (2 falls in past 6 months)  SUBJECTIVE:   SUBJECTIVE STATEMENT: Pt reports a near fall when he got up out of bed too fast and fell against the bedside table causing a skin tear to his R forearm. Denies pain today but still feels weak.  PAIN:  Are you having pain? No  PERTINENT HISTORY:  Aortic atherosclerosis; carotid stenosis; cervical myelopathy s/p ACDF 06/2020 and posterior cervical fusion 11/2020; peripheral neuropathy; lumbar DDD/spondylosis; lumbago with sciatica/lumbar radiculopathy; OA; B TKA; BTHA; L greater trochanteric pain syndrome; L RCR 2014; recent CTR surgery - early May; hearing loss; HTN; BPPV/vertigo; B sacroiliitis; skin cancer; COPD/emphysema; sleep apnea with CPAP; GERD; gout; HLD; LE cramps; RLS  PRECAUTIONS: None  WEIGHT BEARING RESTRICTIONS: No  FALLS: Has patient fallen in last 6 months? Yes. 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 No acute intracranial abnormality.  2. No acute fracture or traumatic subluxation of the cervical spine. L wrist x-ray: 1. Mild dorsal  wrist soft tissue swelling with small ossific density overlying the proximal carpal row. Correlate for triquetral fracture.  2. Moderate scaphotrapeziotrapezoid and mild first CMC joint osteoarthritis. L elbow x-ray: 1. No acute fracture or dislocation, left elbow.  2. Mild elevation of the anterior fat  pad suggestive of a small joint effusion, which may be reactive in the setting of elbow osteoarthritis. L shoulder x-rays: No acute osseous abnormality.  COGNITION: Overall cognitive status: Within functional limits for tasks assessed and Impaired: Memory: Impaired: Short term Procedural   SENSATION: Peripheral neuropathy in B feet  COORDINATION: Mildly impaired with slow movements  EDEMA:  Mild to moderate edema in distal B LE  MUSCLE LENGTH: Hamstrings: mod tight R>L ITB: mod tight B Piriformis: mod tight L>R Hip flexors: mod tight B Quads: mod tight B Heelcord: mod tight B  POSTURE:  rounded shoulders, forward head, and flexed trunk   LOWER EXTREMITY MMT:   Tested in sitting on eval 02/22/22, 03/22/22, 04/17/22 & 05/04/22 MMT Right 02/22/22 Left 02/22/22 Right 03/22/22 Left 03/22/22 Right 04/17/22 Left  04/17/22 Right  05/04/22 Left  05/04/22  Hip flexion 3+ 4- 3+ 4- 4- 4- 3+ 3+  Hip extension 3- 3- 3+ 3+ 3+ 3+ 3 3  Hip abduction 4- 4 4 4+ 4 4+ 4- 4  Hip adduction _0 4+ 4+ 4+ 4 4  Hip internal rotation 3 3+ 3 3+ 3 3+ 3- 3  Hip external rotation 3+ 3+ 3+ 3+ 3+ 3+ 3 3+  Knee flexion 4- 4- 4 4 4+ 4+ 4- 4-  Knee extension 4- 4 4 4+ 4+ 4+ 4 4  Ankle dorsiflexion 4- 4 4- 4 4 4- 4- 4-  Ankle plantarflexion 3+  6 SLS HR 4 10 SLS HR 4+ 20 SLS HR Limited lift on last few reps 5 20 SLS HR 5- 18 SLS HR 5 20 SLS HR 4- 7 SLS HR Pt noting increased proximal LE pain 4+ 15 SLS HR  Ankle inversion 3+ 4- 3+ 4- 4- 4- 4- 4-  Ankle eversion 3+ 4- 3+ 4- 4- 4- 3+ 3+  (Blank rows = not tested)  BED MOBILITY:  Sit to supine SBA Supine to sit SBA  TRANSFERS: Assistive device utilized:  Single point cane and None  Sit to stand: Modified independence Stand to sit: Modified independence Chair to chair: Modified independence Floor:  NT  GAIT: Gait pattern: step through pattern, decreased stride length, decreased hip/knee flexion- Right, decreased hip/knee flexion- Left, decreased trunk rotation, and wide BOS Distance walked: 80 Assistive device utilized: Single point cane Level of assistance: SBA Comments: Gait speed: 2.10 ft/sec with SPC, 2.31 ft/sec w/o AD  RAMP: Level of Assistance:  NT Assistive device utilized:    Ramp Comments:   CURB:  Level of Assistance:  NT Assistive device utilized:    Curb Comments:   STAIRS:  Level of Assistance:  NT  Stair Negotiation Technique:   Number of Stairs:    Height of Stairs:   Comments:   FUNCTIONAL TESTS:  5 times sit to stand: 13.0 sec Timed up and go (TUG): 13.53 sec with SPC; 14.19 sec w/o AD 10 meter walk test: 15.60 sec with SPC; 13.04 sec w/o AD Berg Balance Scale: 39/56 - scores 37-45 indicate significant risk for falls (>80%) Dynamic Gait Index: 11/24; Scores of 19 or less are predictive of falls in older community living adults  PATIENT SURVEYS:  ABC scale 55% of self confidence; 45% impairment  TODAY'S TREATMENT:   05/18/22 THERAPEUTIC EXERCISE: to improve flexibility, strength and mobility.  Verbal and tactile cues throughout for technique. NuStep - L6 x 7 min (UE/LE) R/L Fitter leg press (1 black/1 blue) x 20 each Standing R/L Fitter hip ABD (1 black) x15 each - UE support  on back of chair Standing R/L Fitter hip extension (1 black) x15 each - UE support on back of chair Seated R/L RTB hamstring curls x 15 each Seated RTB B hip ABD/ER clam 2 x 10 Standing RTB rows 10 x 3", 2 sets - cues for upright posture and to focus on scap and core/abdominal muscle activation, 2nd set with R fwd foot staggered stance Standing RTB B scap retraction + shoulder extension to neutral 10 x 3", 2 sets -- cues for  upright posture and to focus on scap and core/abdominal muscle activation, 2nd set with L fwd foot staggered stance   05/11/22 THERAPEUTIC EXERCISE: to improve flexibility, strength and mobility.  Verbal and tactile cues throughout for technique. NuStep - L5 x 6 min (UE/LE) Seated on dynadisc with feet on Airex pad:  - RTB rows 10 x 3", 2 sets - cues to avoid excess shoulder extension and focus on scap and core/abdominal muscle activation  - RTB B scap retraction + shoulder extension to neutral 10 x 3", 2 sets - cues to slow pace and pause to increase muscle activation  - March with 2# ankle cuff wts x 10  - Alt LAQ with 1# cuff wt on R, 2# on L x 10 each  - Bil RTB pallof press x 10 each side  - Hip ADD ball squeeze + alt hip IR with YTB at ankles x 10  - Alt hip ABD/ER unilateral clam with YTB at knees 2 x 10  NEUROMUSCULAR RE-EDUCATION: To improve balance, proprioception, coordination, and reduce fall risk. Standing on Airex pad with CGA/SBA of PT:  - Heel-toe weight shift x 10  - Lateral weight shift x 10  - Step in place x 10  - Alt toe clears to 9" stool x 5  - Bean bag reach and toss 2 x 12, 1 set each with R and L UE   05/09/22 Therapeutic Exercise: Bike [id number] Seated LAQ x 10 bil STS x 10 Standing march/hip abd/hip ext 2x10 all with 2# weight at counter Seated flexion stretch with orange pball 5x10" Seated ab set with orange pball 10x with 5 sec hold Seated chop 2x5 with yellow weight ball Seated chest press with yellow weight ball 2x10   PATIENT EDUCATION: Education details: ongoing PT POC Person educated: Patient Education method: Explanation Education comprehension: verbalized understanding   HOME EXERCISE PROGRAM: Access Code: [email or web address]: [email or web address] Date: 03/22/2022 Prepared by: JAnnie Paras Exercises - Standing Gastroc Stretch at Counter  - 1-2 x daily - 7 x weekly - 2-3 reps - 30 sec hold - Standing Soleus Stretch at Counter   - 1-2 x daily - 7 x weekly - 2-3 reps - 30 sec hold - Hip Flexor Stretch on Step  - 1-2 x daily - 7 x weekly - 2-3 reps - 30 sec hold - Side Stepping with Resistance at Ankles and Counter Support  - 1 x daily - 7 x weekly - 2 sets - 10 reps - Forward and Backward Step Over with Counter Support  - 1 x daily - 7 x weekly - 2 sets - 10 reps - Turning in Corner 360  - 1 x daily - 7 x weekly - 2 sets - 5 reps - Tandem Stance in Corner  - 1 x daily - 7 x weekly - 2 reps - 30 sec hold - Single Leg Stance with Support  - 1 x daily - 7 x weekly - 2 sets - 30 sec hold -  Squat with Chair Touch  - 1 x daily - 7 x weekly - 2 sets - 10 reps - 3 sec hold - Seated Hamstring Stretch  - 1-2 x daily - 7 x weekly - 2 sets - 30 sec hold - Seated Hip Adductor Stretch  - 1-2 x daily - 7 x weekly - 3 reps - 30 sec hold - Seated Figure 4 Piriformis Stretch  - 1-2 x daily - 7 x weekly - 2 sets - 30 sec hold - Supine Piriformis Stretch with Foot on Ground  - 1-2 x daily - 7 x weekly - 2-3 reps - 30 sec hold - Supine Lower Trunk Rotation  - 1 x daily - 7 x weekly - 2 sets - 30 sec hold - Bent Knee Fallouts with Alternating Legs  - 1 x daily - 7 x weekly - 2 sets - 10 reps - 3 sec hold - Seated Hip Abduction with Resistance  - 1 x daily - 3-4 x weekly - 2 sets - 10 reps - 3-5 sec hold - Seated Single Leg Hip Abduction with Resistance  - 1 x daily - 3-4 x weekly - 2 sets - 10 reps - 3 sec hold - Seated March with Resistance  - 1 x daily - 3-4 x weekly - 2 sets - 10 reps - 3 sec hold - Seated Shoulder Row with Anchored Resistance  - 1 x daily - 3-4 x weekly - 2 sets - 10 reps - 3-5 hold hold  Patient Education - Check for Safety   ASSESSMENT:  CLINICAL IMPRESSION: Enzo "[name]"  reports a near fall when he got up out of bed to quickly and stumbled against the bedside table causing a skin tear to his R forearm but denying any other injuries. He continues to c/o weakness and increased instability since his  hospitalization for acute blood loss from a GI bleed, therefore therapy focusing on continued postural, core and LE strengthening while increasing standing activities with close guarding of PT to ensure safety and promote better balance. Repeated cues necessary for more upright posture during standing exercises. [name] is slowly showing signs of recovery following his hospitalization but is not yet back to his functional level prior the the hospitalization.   OBJECTIVE IMPAIRMENTS Abnormal gait, decreased activity tolerance, decreased balance, decreased endurance, decreased knowledge of condition, decreased knowledge of use of DME, decreased mobility, difficulty walking, decreased ROM, decreased strength, decreased safety awareness, increased fascial restrictions, impaired perceived functional ability, increased muscle spasms, impaired flexibility, improper body mechanics, postural dysfunction, and pain.   ACTIVITY LIMITATIONS carrying, lifting, bending, standing, squatting, stairs, transfers, and locomotion level  PARTICIPATION LIMITATIONS: cleaning, laundry, interpersonal relationship, community activity, and yard work  PERSONAL FACTORS Age, Fitness, Past/current experiences, Time since onset of injury/illness/exacerbation, and 3+ comorbidities: Aortic atherosclerosis; carotid stenosis; cervical myelopathy s/p ACDF 06/2020 and posterior cervical fusion 11/2020; peripheral neuropathy; lumbar DDD/spondylosis; lumbago with sciatica/lumbar radiculopathy; OA; B TKA; BTHA; L greater trochanteric pain syndrome; L RCR 2014; recent CTR surgery - early May; hearing loss; HTN; BPPV/vertigo; B sacroiliitis; skin cancer; COPD/emphysema; sleep apnea with CPAP; GERD; gout; HLD; LE cramps; RLS  are also affecting patient's functional outcome.   REHAB POTENTIAL: Good  CLINICAL DECISION MAKING: Evolving/moderate complexity  EVALUATION COMPLEXITY: Moderate  GOALS: Goals reviewed with patient? Yes  SHORT TERM GOALS:  Target date: 03/22/2022, extended to 06/01/22   Patient will be independent with initial HEP. Baseline:  Goal status: MET  03/22/22  2.  Patient will  demonstrate decreased fall risk by scoring > 45/56 on Berg. Baseline: 39/56 (eval - 02/22/22); 43/56 (03/22/22); 48/56 (04/17/22); decreased to 40/56 (05/04/22) following hospitalization Goal status: IN PROGRESS  05/04/22 - 40/56  3.  Patient will be educated on strategies to decrease risk of falls.  Baseline:  Goal status: MET  03/22/22 - Education provided last visit - Pt reports no concerns as he went through the home checklist other than the bathroom mat non-skid backing has disintegrated from repeated washing (he states he pushes it to the side while he is in the BR)   LONG TERM GOALS: Target date: 04/19/2022, extended to 05/29/2022, then 06/29/22   Patient will be independent with advanced/ongoing HEP to improve outcomes and carryover.  Baseline:  Goal status: IN PROGRESS  2.  Patient will be able to ambulate 600' with LRAD with good safety to access community.  Baseline:  Goal status: IN PROGRESS  3.  Patient will be able to step up/down curb safely with LRAD for safety with community ambulation.  Baseline:  Goal status: MET  04/17/22  4.  Patient will demonstrate improved functional LE strength as demonstrated by overall MMT >/= 4 to 4+/5. Baseline:  Goal status: IN PROGRESS  05/04/22 - Declined following hospitalization (refer to MMT chart)  5.  Patient will demonstrate at least 19/24 on DGI to improve gait stability and reduce risk for falls. Baseline: 11/24 (eval); 16/24 (03/31/22); 18/24 (04/17/22); 12/24 (05/04/22) Goal status: IN PROGRESS  05/04/22 - Decreased to 12/24 following hospitalization  6.  Patient will score >/= 52/56 on Berg Balance test to demonstrate lower risk of falls. (MCID= 8 points) .  Baseline: 39/56 (eval - 02/22/22); 43/56 (03/22/22); 48/56 (04/17/22); 40/56 (05/04/22) Goal status: IN PROGRESS  05/04/22 - Decreased to  40/56 following hospitalization  7.  Patient will report >67% of self confidence on ABC scale to demonstrate improved functional ability. Baseline: 55% self confidence (eval); 53.8 % (04/17/22) Goal status: IN PROGRESS  8.  Patient will demonstrate gait speed of >/= 2.62 ft/sec (0.80 m/s) to be a safe community ambulator with decreased risk for recurrent falls.  Baseline: 2.10 ft/sec with SPC, 2.31 ft/sec w/o AD (eval); 2.90 ft/sec with SPC; 2.92 ft/sec w/o AD (03/31/22); 2.99 ft/sec with SPC; 3.14 ft/sec w/o AD (04/17/22); 2.77 ft/sec with SPC; 3.09 ft/sec w/o AD but much more unsteady (05/04/22) Goal status: MET  03/31/22   PLAN: PT FREQUENCY: 2x/week  PT DURATION: 7-8 weeks  PLANNED INTERVENTIONS: Therapeutic exercises, Therapeutic activity, Neuromuscular re-education, Balance training, Gait training, Patient/Family education, Joint mobilization, Stair training, Vestibular training, Canalith repositioning, Dry Needling, Electrical stimulation, Spinal manipulation, Cryotherapy, Moist heat, Taping, Ultrasound, Ionotophoresis 3m/ml Dexamethasone, Manual therapy, and Re-evaluation  PLAN FOR NEXT SESSION:  progress lumbopelvic/LE flexibility and strengthening; balance training with close guarding; HEP review/update/modification as needed   JPercival Spanish PT 05/18/2022, 5:58 PM

## 2022-05-22 ENCOUNTER — Ambulatory Visit (INDEPENDENT_AMBULATORY_CARE_PROVIDER_SITE_OTHER): Payer: Medicare HMO | Admitting: Ophthalmology

## 2022-05-22 ENCOUNTER — Encounter (INDEPENDENT_AMBULATORY_CARE_PROVIDER_SITE_OTHER): Payer: Self-pay | Admitting: Ophthalmology

## 2022-05-22 DIAGNOSIS — H353211 Exudative age-related macular degeneration, right eye, with active choroidal neovascularization: Secondary | ICD-10-CM

## 2022-05-22 DIAGNOSIS — H35353 Cystoid macular degeneration, bilateral: Secondary | ICD-10-CM | POA: Diagnosis not present

## 2022-05-22 DIAGNOSIS — H35073 Retinal telangiectasis, bilateral: Secondary | ICD-10-CM | POA: Diagnosis not present

## 2022-05-22 DIAGNOSIS — G4733 Obstructive sleep apnea (adult) (pediatric): Secondary | ICD-10-CM | POA: Diagnosis not present

## 2022-05-22 MED ORDER — BEVACIZUMAB CHEMO INJECTION 1.25MG/0.05ML SYRINGE FOR KALEIDOSCOPE
1.2500 mg | INTRAVITREAL | Status: AC | PRN
  Administered 2022-05-22: 1.25 mg via INTRAVITREAL

## 2022-05-22 NOTE — Assessment & Plan Note (Signed)
Improved overall currently at 8-week interval on Avastin.  Repeat injection today and extend interval to 9 to 10 weeks next

## 2022-05-22 NOTE — Assessment & Plan Note (Signed)
Stable overall, patient continues on CPAP therapy

## 2022-05-22 NOTE — Progress Notes (Signed)
05/22/2022     CHIEF COMPLAINT Patient presents for  Chief Complaint  Patient presents with   Macular Degeneration      HISTORY OF PRESENT ILLNESS: [name] Pratt is a 81 y.o. male who presents to the clinic today for:   HPI   8 WEEKS FOR DILATE OD, AVASTIN OCT. Pt stated vision is slightly blurry in OD. Pt reports, "the blurry vision comes and goes and gradually clears up throughout the day."  Last edited by Silvestre Moment on 05/22/2022 10:28 AM.      Referring physician: Darleen Crocker, MD [address] [place],  [place] [address]  HISTORICAL INFORMATION:   Selected notes from the MEDICAL RECORD NUMBER    Lab Results  Component Value Date   HGBA1C 5.5 03/07/2022     CURRENT MEDICATIONS: Current Outpatient Medications (Ophthalmic Drugs)  Medication Sig   cycloSPORINE (RESTASIS) 0.05 % ophthalmic emulsion Place 1 drop into both eyes 2 (two) times daily.   No current facility-administered medications for this visit. (Ophthalmic Drugs)   Current Outpatient Medications (Other)  Medication Sig   acetaminophen (TYLENOL) 650 MG CR tablet Take 650-1,300 mg by mouth See admin instructions. Take 1,300 mg by mouth in the morning and 650 mg at bedtime   albuterol (VENTOLIN HFA) 108 (90 Base) MCG/ACT inhaler INHALE 2 PUFFS INTO THE LUNGS EVERY 6 HOURS AS NEEDED FOR SHORTNESS OF BREATH. (Patient taking differently: Inhale 2 puffs into the lungs every 6 (six) hours as needed for shortness of breath.)   allopurinol (ZYLOPRIM) 100 MG tablet TAKE 1 TABLET TWICE DAILY (Patient taking differently: Take 100 mg by mouth in the morning and at bedtime.)   antiseptic oral rinse (BIOTENE) LIQD 15 mLs by Mouth Rinse route in the morning.   aspirin EC 81 MG tablet Take 81 mg by mouth at bedtime.   diclofenac Sodium (VOLTAREN) 1 % GEL Apply 2 g topically daily as needed (for arthritic pain in the hands).   docusate sodium (COLACE) 100 MG capsule Take 1 capsule (100 mg total) by mouth 2  (two) times daily.   fluocinonide (LIDEX) 0.05 % external solution Apply 1 application  topically 2 (two) times daily as needed for itching (SCALP).   GEMTESA 75 MG TABS Take 75 mg by mouth daily.   glucose blood test strip TRUE Metrix Blood Glucose Test Strip, use as instructed   Homeopathic Products (THERAWORX RELIEF) FOAM Apply 1 application topically 2 (two) times daily as needed (Pain).   hydrALAZINE (APRESOLINE) 50 MG tablet Take 1 tablet (50 mg total) by mouth 2 (two) times daily. (Patient taking differently: Take 50 mg by mouth in the morning and at bedtime.)   Lancets MISC 1 each by Does not apply route 2 (two) times daily. TRUE matrix lancets   levothyroxine (SYNTHROID) 150 MCG tablet Take 150 mcg 6 days a week (patient takes 75 mcg once a week) Disp #84 for 90 day supply (Patient taking differently: Take 150 mcg by mouth See admin instructions. Take 150 mcg by mouth in the morning before breakfast on Mon/Tues/Wed/Thurs/Fri/Sat)   levothyroxine (SYNTHROID) 75 MCG tablet Take 75 mcg once a week (patient on 150 mcg 6 days a week) Dispense #24 for 90 day supply. (Patient taking differently: Take 75 mcg by mouth See admin instructions. Take 75 mcg by mouth in the morning before breakfast on Sundays only)   lip balm (CARMEX) ointment Apply 1 Application topically as needed for lip care or dry skin.  loratadine (CLARITIN) 10 MG tablet Take 10 mg by mouth daily as needed for allergies or rhinitis.   losartan (COZAAR) 100 MG tablet TAKE 1 TABLET EVERY DAY (Patient taking differently: Take 100 mg by mouth daily after supper.)   Magnesium 500 MG TABS Take 500 mg by mouth daily.   multivitamin (THERAGRAN) per tablet Take 1 tablet by mouth daily with breakfast.   nitroGLYCERIN (NITROSTAT) 0.4 MG SL tablet Place 1 tablet (0.4 mg total) under the tongue every 5 (five) minutes as needed for chest pain.   ondansetron (ZOFRAN-ODT) 4 MG disintegrating tablet Take 1 tablet (4 mg total) by mouth every 8  (eight) hours as needed for nausea or vomiting. (Patient taking differently: Take 4 mg by mouth every 8 (eight) hours as needed for nausea or vomiting (dissolve orally).)   polyethylene glycol (MIRALAX / GLYCOLAX) 17 g packet Take 17-34 g by mouth daily as needed for constipation (mix and drink).   PRESCRIPTION MEDICATION CPAP- At bedtime and during any time of rest   rosuvastatin (CRESTOR) 5 MG tablet TAKE 1 TABLET EVERY DAY (Patient taking differently: Take 5 mg by mouth daily.)   sodium chloride (OCEAN) 0.65 % nasal spray Place 1 spray into both nostrils at bedtime.   traMADol (ULTRAM) 50 MG tablet Take 1 tablet (50 mg total) by mouth every 12 (twelve) hours as needed.   vitamin B-12 (CYANOCOBALAMIN) 500 MCG tablet Take 500 mcg by mouth daily.   No current facility-administered medications for this visit. (Other)      REVIEW OF SYSTEMS: ROS   Negative for: Constitutional, Gastrointestinal, Neurological, Skin, Genitourinary, Musculoskeletal, HENT, Endocrine, Cardiovascular, Eyes, Respiratory, Psychiatric, Allergic/Imm, Heme/Lymph Last edited by Silvestre Moment on 05/22/2022 10:28 AM.       ALLERGIES Allergies  Allergen Reactions   Pravastatin Other (See Comments)    Muscle cramps   Sildenafil Other (See Comments)    Tachycardia     Oxycodone Other (See Comments)    Hallucinations    Atenolol Other (See Comments)    Bradycardia    Elastic Bandages & [Zinc] Other (See Comments)    Teflon bandages - Irritation   Metoclopramide Hcl Anxiety    PAST MEDICAL HISTORY Past Medical History:  Diagnosis Date   Allergic rhinitis 02/02/2016   Anemia 12/17/2009   Formatting of this note might be different from the original. Anemia  10/1 IMO update   Aortic atherosclerosis ([place]) 03/05/2020   Arthritis    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   Atypical chest pain 11/24/2015   Benign essential hypertension 05/13/2009   Qualifier: Diagnosis of  By: Larwance Sachs of this note might be different from the original. Hypertension   Benign paroxysmal positional vertigo 09/14/2021   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[place]) 07/22/2021   Body mass index (BMI) 25.0-25.9, adult 11/02/2020   Cancer (HCC)    skin cancer   Cardiac murmur 07/25/2021   Carotid stenosis    a. Carotid U/S 5/44: LICA < 92%, RICA 01-00%;  b.  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   Cystoid macular edema of both eyes 07/28/2020   Degenerative disc disease,  lumbar 06/30/2015   Deviated nasal septum 02/18/2015   Diverticulosis 03/03/2020   DJD (degenerative joint disease) of hip 12/24/2012   Dyspnea 06/12/2011   Emphysema, unspecified ([place]) 03/05/2020   Erectile dysfunction 06/20/2017   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[place]) 02/09/2021   Gait disorder 03/02/2021   GERD (gastroesophageal reflux disease)    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   Qualifier: Diagnosis of  By: Burnett Kanaris     Greater trochanteric pain syndrome 07/22/2021   Hand pain, left 01/16/2022   Hearing loss 02/02/2016   Heme positive stool 02/29/2020   HIATAL HERNIA 05/13/2009   Qualifier: Diagnosis of  By: Burnett Kanaris     High risk medication use 01/18/2012   Hip pain 03/02/2021   History of chicken pox    History of COVID-19 12/05/2021   History of hemorrhoids    History of kidney stones    History of skin cancer 07/22/2021   skin cancer   History of total bilateral knee replacement 07/08/2020   Hyperglycemia 03/03/2014   Hyperlipidemia with target LDL less than 130 12/27/2010   Formatting of this  note might be different from the original. 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 Osteoarthritis Of The Hand  10/1 IMO update   Osteoarthrosis, unspecified whether generalized or localized, pelvic region and thigh 07/25/2010   Formatting of this note might be different from the original. Osteoarthritis Of The Hip   Other abnormal glucose 07/22/2021   Other allergic rhinitis 02/18/2015   Palpitations 07/25/2021   Peripheral neuropathy 06/19/2016   Preventative health care 11/24/2015   Right groin pain 11/29/2012   Right inguinal hernia 05/13/2009   Qualifier: Diagnosis of  By: Burnett Kanaris     RLS (restless legs syndrome) 02/18/2015   Rosacea    Rotator cuff tear 07/27/2013   Situational depression 03/02/2021   Skin lesion 01/16/2022   Sleep apnea with use of continuous positive airway pressure (CPAP) 07/15/2013   CPAP set to  7 cm water,  Residual AHi was 7.8  With no leak.  User time 6 hours.  479 days , not used only 12 days - highly compliant 06-23-13  .    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   Urinary retention 11/26/2020   Ventral hernia 07/08/2012   Vertigo 07/08/2021   Weight loss 03/03/2020   Past Surgical History:  Procedure Laterality Date   ABDOMINAL HERNIA REPAIR  07/15/12   Dr [name]   ANTERIOR CERVICAL DECOMP/DISCECTOMY FUSION N/A 07/09/2020   Procedure: ANTERIOR CERVICAL DECOMPRESSION AND FUSION CERVICAL  THREE-FOUR.;  Surgeon: [name], MD;  Location: [facility];  Service: Neurosurgery;  Laterality: N/A;  anterior   BROW LIFT  05/07/01   COLONOSCOPY WITH PROPOFOL N/A 04/24/2022   Procedure: COLONOSCOPY WITH PROPOFOL;  Surgeon: Thornton Park, MD;  Location: [facility];  Service: Gastroenterology;  Laterality: N/A;   COLONOSCOPY WITH PROPOFOL N/A 04/26/2022   Procedure: COLONOSCOPY WITH PROPOFOL;  Surgeon: Thornton Park, MD;  Location: [facility];  Service: Gastroenterology;  Laterality: N/A;   CYSTOSCOPY WITH URETEROSCOPY AND STENT PLACEMENT Right 01/28/2014   Procedure: CYSTOSCOPY WITH URETEROSCOPY, BASKET RETRIVAL AND  STENT PLACEMENT;  Surgeon: [name], MD;  Location: [facility];  Service: Urology;  Laterality: Right;   epidural injections     multiple procedures   ESOPHAGEAL DILATION  1993 and 1994   multiple times   EYE SURGERY Bilateral 03/25/10, 2012   cataract removal   HEMORRHOID SURGERY  2014   HIATAL HERNIA REPAIR  12/21/93   HOLMIUM LASER APPLICATION Right 04/22/7516   Procedure: HOLMIUM LASER APPLICATION;  Surgeon: [name], MD;  Location: [facility];  Service: Urology;  Laterality: Right;   INGUINAL HERNIA REPAIR Right 07/15/12   Dr [name], x2   JOINT REPLACEMENT  07/25/04   right knee   JOINT REPLACEMENT  07/13/10   left knee   [place]  09/20/06   with intraoperative cholangiogram and right inguinal herniorrhaphy with mesh    LIGAMENT REPAIR Left 07/2013   shoulder   LITHOTRIPSY     x2   [place]   POLYPECTOMY  04/26/2022    Procedure: POLYPECTOMY;  Surgeon: Thornton Park, MD;  Location: [facility];  Service: Gastroenterology;;   POSTERIOR CERVICAL FUSION/FORAMINOTOMY N/A 11/24/2020   Procedure: Posterior Cervical Laminectomy Cervical three-four, Cervical four-five with lateral mass fusion;  Surgeon: [name], MD;  Location: [facility];  Service: Neurosurgery;  Laterality: N/A;   REFRACTIVE SURGERY Bilateral    Right total hip replacement  4/14   SKIN CANCER DESTRUCTION     nose, ear   TONSILLECTOMY  as child   TOTAL HIP ARTHROPLASTY Left    URETHRAL DILATION  1991   [name]    FAMILY HISTORY Family History  Problem Relation Age of Onset   Cancer Mother    Hypertension Other    Cancer Other    Osteoarthritis Other     SOCIAL HISTORY Social History   Tobacco Use   Smoking status: Former    Years: 11.00    Types: Cigarettes    Quit date: 09/11/1978    Years since quitting: 43.7   Smokeless tobacco: Never  Vaping Use   Vaping Use: Never used  Substance Use Topics   Alcohol use: Not Currently    Alcohol/week: 0.0 standard drinks of alcohol    Comment: rare   Drug use: No         OPHTHALMIC EXAM:  Base Eye Exam     Visual Acuity (ETDRS)       Right Left   Dist [place] 20/70 -2 20/40 -2   Dist ph [place] 20/60 -2 20/40 -2 +2         Tonometry (Tonopen, 10:33 AM)       Right Left   Pressure 10 13         Pupils       Pupils APD   Right PERRL None   Left PERRL None  Visual Fields       Left Right    Full Full         Extraocular Movement       Right Left    Full Full         Neuro/Psych     Oriented x3: Yes   Mood/Affect: Normal         Dilation     Right eye: 2.5% Phenylephrine, 1.0% Mydriacyl @ 10:33 AM           Slit Lamp and Fundus Exam     External Exam       Right Left   External Normal Normal         Slit Lamp Exam       Right Left   Lids/Lashes Normal Normal   Conjunctiva/Sclera White and quiet White and quiet    Cornea Clear Clear   Anterior Chamber Deep and quiet Deep and quiet   Iris PI PI   Lens Clear Clear   Anterior Vitreous Normal Normal         Fundus Exam       Right Left   Posterior Vitreous Posterior vitreous detachment    Disc Normal    C/D Ratio 0.5    Macula No overt CME, angulated vessels temporally, with thickening along the p.m. bundle.  Much less    Vessels Normal    Periphery Normal             IMAGING AND PROCEDURES  Imaging and Procedures for 05/22/22  OCT, Retina - OU - Both Eyes       Right Eye Quality was good. Scan locations included subfoveal. Central Foveal Thickness: 239. Progression has improved. Findings include abnormal foveal contour, cystoid macular edema.   Left Eye Quality was good. Scan locations included subfoveal. Central Foveal Thickness: 222. Progression has been stable. Findings include abnormal foveal contour, cystoid macular edema.   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  We will repeat to control the peripapillary CNVM.  Chronic MAC-TEL continues OU      Intravitreal Injection, Pharmacologic Agent - OD - Right Eye       Time Out 05/22/2022. 11:27 AM. Confirmed correct patient, procedure, site, and patient consented.   Anesthesia Topical anesthesia was used. Anesthetic medications included Lidocaine 4%.   Procedure Preparation included 5% betadine to ocular surface, 10% betadine to eyelids, Tobramycin 0.3%. A 30 gauge needle was used.   Injection: 1.25 mg Bevacizumab 1.92m/0.05ml   Route: Intravitreal, Site: Right Eye   NDC: 50242-060-01, Lot: [id number] Expiration date: 07/26/2022   Post-op Post injection exam found visual acuity of at least counting fingers, no retinal detachment, perfused optic nerve. The patient tolerated the procedure well. There were no complications. The patient received written and verbal post procedure  care education. Post injection medications included ocuflox.              ASSESSMENT/PLAN:  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HCC) Improved overall currently at 8-week interval on Avastin.  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HCC)  H35.3211 OCT, Retina - OU - Both Eyes    Intravitreal Injection, Pharmacologic Agent - OD - Right Eye    Bevacizumab (AVASTIN) SOLN 1.25 mg    2. Cystoid macular edema of both eyes  H35.353     3. 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 OSA (obstructive sleep apnea)  G47.33       1.  OD, much less active peripapillary CNVM on chronic suppressive therapy now for some 10 months.  At 8-week follow-up today and stabilized and no recurrence we will repeat injection today and extend interval examination next  2.  MAC-TEL, no progression, continues on CPAP therapy  3.  CME is chronic cavitary relative to old  MAC-TEL ,not active disease  Ophthalmic Meds Ordered this visit:  Meds ordered this encounter  Medications   Bevacizumab (AVASTIN) SOLN 1.25 mg       Return in about 10 weeks (around 07/31/2022) for DILATE OU, AVASTIN OCT, OD.  There are no Patient Instructions on file for this visit.   Explained the diagnoses, plan, and follow up with the patient and they expressed understanding.  Patient expressed understanding of the importance of proper follow up care.   Clent Demark Austynn Pridmore M.D. Diseases & Surgery of the Retina and  Vitreous Retina & Diabetic [place] 05/22/22     Abbreviations: M myopia (nearsighted); A astigmatism; H hyperopia (farsighted); P presbyopia; Mrx spectacle prescription;  CTL contact lenses; OD right eye; OS left eye; OU both eyes  XT exotropia; ET esotropia; PEK punctate epithelial keratitis; PEE punctate epithelial erosions; DES dry eye syndrome; MGD meibomian gland dysfunction; ATs artificial tears; PFAT's preservative free artificial tears; [place] nuclear sclerotic cataract; PSC posterior subcapsular cataract; ERM epi-retinal membrane; PVD posterior vitreous detachment; RD retinal detachment; DM diabetes mellitus; DR diabetic retinopathy; NPDR non-proliferative diabetic retinopathy; PDR proliferative diabetic retinopathy; CSME clinically significant macular edema; DME diabetic macular edema; dbh dot blot hemorrhages; CWS cotton wool spot; POAG primary open angle glaucoma; C/D cup-to-disc ratio; HVF humphrey visual field; GVF goldmann visual field; OCT optical coherence tomography; IOP intraocular pressure; BRVO Branch retinal vein occlusion; CRVO central retinal vein occlusion; CRAO central retinal artery occlusion; BRAO branch retinal artery occlusion; RT retinal tear; SB scleral buckle; PPV pars plana vitrectomy; VH Vitreous hemorrhage; PRP panretinal laser photocoagulation; IVK intravitreal kenalog; VMT vitreomacular traction; MH Macular hole;  NVD neovascularization of the disc; NVE neovascularization elsewhere; AREDS age related eye disease study; ARMD age related macular degeneration; POAG primary open angle glaucoma; EBMD epithelial/anterior basement membrane dystrophy; ACIOL anterior chamber intraocular lens; IOL intraocular lens; PCIOL posterior chamber intraocular lens; Phaco/IOL phacoemulsification with intraocular lens placement; [place] photorefractive keratectomy; LASIK laser assisted in situ keratomileusis; HTN hypertension; DM diabetes mellitus; COPD chronic obstructive pulmonary disease

## 2022-05-22 NOTE — Assessment & Plan Note (Signed)
Patient reports ongoing compliance with CPAP

## 2022-05-22 NOTE — Assessment & Plan Note (Signed)
>>  ASSESSMENT AND PLAN FOR OSA (OBSTRUCTIVE SLEEP APNEA) WRITTEN ON 05/22/2022 11:29 AM BY Shon Downing, MD  Patient reports ongoing compliance with CPAP

## 2022-05-22 NOTE — Assessment & Plan Note (Signed)
Component of chronic MAC-TEL, cavitary changes these are chronic not active disease

## 2022-05-23 ENCOUNTER — Encounter: Payer: Medicare HMO | Admitting: Physical Therapy

## 2022-05-23 DIAGNOSIS — G5600 Carpal tunnel syndrome, unspecified upper limb: Secondary | ICD-10-CM | POA: Diagnosis not present

## 2022-05-23 DIAGNOSIS — H04123 Dry eye syndrome of bilateral lacrimal glands: Secondary | ICD-10-CM | POA: Diagnosis not present

## 2022-05-23 DIAGNOSIS — G5620 Lesion of ulnar nerve, unspecified upper limb: Secondary | ICD-10-CM | POA: Diagnosis not present

## 2022-05-23 DIAGNOSIS — M48062 Spinal stenosis, lumbar region with neurogenic claudication: Secondary | ICD-10-CM | POA: Diagnosis not present

## 2022-05-23 DIAGNOSIS — H18413 Arcus senilis, bilateral: Secondary | ICD-10-CM | POA: Diagnosis not present

## 2022-05-23 DIAGNOSIS — H35343 Macular cyst, hole, or pseudohole, bilateral: Secondary | ICD-10-CM | POA: Diagnosis not present

## 2022-05-23 DIAGNOSIS — Z961 Presence of intraocular lens: Secondary | ICD-10-CM | POA: Diagnosis not present

## 2022-05-24 ENCOUNTER — Other Ambulatory Visit (HOSPITAL_BASED_OUTPATIENT_CLINIC_OR_DEPARTMENT_OTHER): Payer: Self-pay | Admitting: Neurosurgery

## 2022-05-25 ENCOUNTER — Ambulatory Visit: Payer: Medicare HMO

## 2022-05-25 ENCOUNTER — Other Ambulatory Visit (HOSPITAL_BASED_OUTPATIENT_CLINIC_OR_DEPARTMENT_OTHER): Payer: Self-pay | Admitting: Neurosurgery

## 2022-05-25 DIAGNOSIS — M5459 Other low back pain: Secondary | ICD-10-CM

## 2022-05-25 DIAGNOSIS — R296 Repeated falls: Secondary | ICD-10-CM | POA: Diagnosis not present

## 2022-05-25 DIAGNOSIS — R2681 Unsteadiness on feet: Secondary | ICD-10-CM

## 2022-05-25 DIAGNOSIS — M6281 Muscle weakness (generalized): Secondary | ICD-10-CM

## 2022-05-25 DIAGNOSIS — R2689 Other abnormalities of gait and mobility: Secondary | ICD-10-CM | POA: Diagnosis not present

## 2022-05-25 DIAGNOSIS — G5600 Carpal tunnel syndrome, unspecified upper limb: Secondary | ICD-10-CM

## 2022-05-25 NOTE — Therapy (Signed)
OUTPATIENT PHYSICAL THERAPY  TREATMENT    Patient Name: [name] Pratt MRN: [id number] DOB:18-Jul-1941, 81 y.o., male Today's Date: 05/25/2022      PT End of Session - 05/25/22 1150     Visit Number 17    Number of Visits 27    Date for PT Re-Evaluation 06/29/22    Authorization Type [name]    Authorization Time Period 02/24/22 - 07/18/22    Authorization - Visit Number 16    Authorization - Number of Visits 26    Progress Note Due on Visit 23   Recert completed on visit #13 - 05/04/22   PT Start Time 1104    PT Stop Time 1147    PT Time Calculation (min) 43 min    Activity Tolerance Patient tolerated treatment well    Behavior During Therapy [facility] for tasks assessed/performed                    Past Medical History:  Diagnosis Date   Allergic rhinitis 02/02/2016   Anemia 12/17/2009   Formatting of this note might be different from the original. 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 Hypertension   Benign paroxysmal positional vertigo 09/14/2021   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HCC) 07/22/2021   Body mass index (BMI) 25.0-25.9, adult 11/02/2020   Cancer (HCC)    skin cancer   Cardiac murmur 07/25/2021   Carotid stenosis    a. Carotid U/S 5/13: LICA < 50%, RICA 50-69%;  b.  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   Cystoid  macular edema of both eyes 07/28/2020   Degenerative disc disease, lumbar 06/30/2015   Deviated nasal septum 02/18/2015   Diverticulosis 03/03/2020   DJD (degenerative joint disease) of hip 12/24/2012   Dyspnea 06/12/2011   Emphysema, unspecified (HCC) 03/05/2020   Erectile dysfunction 06/20/2017   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   Gait disorder 03/02/2021   GERD (gastroesophageal reflux disease)    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   Qualifier: Diagnosis of  By: Kem Parkinson     Greater trochanteric pain syndrome 07/22/2021   Hand pain, left 01/16/2022   Hearing loss 02/02/2016   Heme positive stool 02/29/2020   HIATAL HERNIA 05/13/2009   Qualifier: Diagnosis of  By: Kem Parkinson     High risk medication use 01/18/2012   Hip pain 03/02/2021   History of chicken pox    History of COVID-19 12/05/2021   History of hemorrhoids    History of kidney stones    History of skin cancer 07/22/2021   skin cancer   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 Cardiology - Dr Estrella Myrtle at [facility] cardiology  ICD-10 cut over   Hypertension    under control   Hypokalemia 07/08/2021   Hypothyroidism    Internal hemorrhoids 03/03/2020   Leg cramps 11/13/2019   Lower GI bleed 12/12/2012   Lumbago of lumbar region with sciatica 10/21/2020   Lumbar radiculopathy 03/02/2021   Lumbar spondylosis 11/02/2020   NEPHROLITHIASIS 05/13/2009   Qualifier: Diagnosis of  By: Burnett Kanaris     Nonspecific abnormal electrocardiogram (ECG) (EKG) 01/06/2013   Numbness of hand 01/24/2022   Onychomycosis 06/30/2015   Osteoarthrosis, 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 Osteoarthritis Of The Hand  10/1 IMO update   Osteoarthrosis,  unspecified whether generalized or localized, pelvic region and thigh 07/25/2010   Formatting of this note might be different from the original. Osteoarthritis Of The Hip   Other abnormal glucose 07/22/2021   Other allergic rhinitis 02/18/2015   Palpitations 07/25/2021   Peripheral neuropathy 06/19/2016   Preventative health care 11/24/2015   Right groin pain 11/29/2012   Right inguinal hernia 05/13/2009   Qualifier: Diagnosis of  By: Burnett Kanaris     RLS (restless legs syndrome) 02/18/2015   Rosacea    Rotator cuff tear 07/27/2013   Situational depression 03/02/2021   Skin lesion 01/16/2022   Sleep apnea with use of continuous positive airway pressure (CPAP) 07/15/2013   CPAP set to  7 cm water,  Residual AHi was 7.8  With no leak.  User time 6 hours.  479 days , not used only 12 days - highly compliant 06-23-13  .    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   Urinary retention 11/26/2020   Ventral hernia 07/08/2012   Vertigo 07/08/2021   Weight loss 03/03/2020   Past Surgical History:  Procedure Laterality Date   ABDOMINAL HERNIA REPAIR  07/15/12   Dr [name]   ANTERIOR CERVICAL DECOMP/DISCECTOMY FUSION N/A 07/09/2020   Procedure: ANTERIOR CERVICAL DECOMPRESSION AND FUSION CERVICAL THREE-FOUR.;  Surgeon: [name], MD;  Location: [place];  Service: Neurosurgery;  Laterality: N/A;  anterior   BROW LIFT  05/07/01   COLONOSCOPY WITH PROPOFOL N/A 04/24/2022   Procedure: COLONOSCOPY WITH PROPOFOL;  Surgeon: Thornton Park, MD;  Location: [place];  Service: Gastroenterology;  Laterality: N/A;   COLONOSCOPY WITH PROPOFOL N/A 04/26/2022   Procedure:  COLONOSCOPY WITH PROPOFOL;  Surgeon: Thornton Park, MD;  Location: [place];  Service: Gastroenterology;  Laterality: N/A;   CYSTOSCOPY WITH URETEROSCOPY AND STENT PLACEMENT Right 01/28/2014   Procedure: CYSTOSCOPY WITH URETEROSCOPY, BASKET RETRIVAL AND  STENT PLACEMENT;  Surgeon: [name], MD;  Location: [facility];  Service: Urology;  Laterality: Right;   epidural injections     multiple procedures   ESOPHAGEAL DILATION  1993 and 1994   multiple times   EYE SURGERY Bilateral 03/25/10, 2012   cataract removal   HEMORRHOID SURGERY  2014   HIATAL HERNIA REPAIR  12/21/93   HOLMIUM LASER APPLICATION Right 9/62/9528   Procedure: HOLMIUM LASER APPLICATION;  Surgeon: [name], MD;  Location: [facility];  Service: Urology;  Laterality: Right;   INGUINAL HERNIA REPAIR Right 07/15/12   Dr [name], x2   JOINT REPLACEMENT  07/25/04   right knee   JOINT REPLACEMENT  07/13/10   left knee   KNEE SURGERY  1995   LAPAROSCOPIC CHOLECYSTECTOMY  09/20/06   with intraoperative cholangiogram and right inguinal herniorrhaphy with mesh    LIGAMENT REPAIR Left 07/2013   shoulder   LITHOTRIPSY     x2   NASAL SEPTUM SURGERY  1975   POLYPECTOMY  04/26/2022   Procedure: POLYPECTOMY;  Surgeon: Thornton Park, MD;  Location: [place];  Service: Gastroenterology;;   POSTERIOR CERVICAL FUSION/FORAMINOTOMY N/A 11/24/2020   Procedure: Posterior Cervical Laminectomy Cervical three-four, Cervical four-five with lateral mass fusion;  Surgeon: [name], MD;  Location: [place];  Service: Neurosurgery;  Laterality: N/A;   REFRACTIVE SURGERY Bilateral    Right total hip replacement  4/14   SKIN CANCER DESTRUCTION     nose, ear   TONSILLECTOMY  as child   TOTAL HIP ARTHROPLASTY Left    URETHRAL DILATION  1991   [name]   Patient Active Problem List   Diagnosis Date Noted   Hand weakness 05/02/2022   Acute kidney injury ([place]) 05/02/2022   Adenomatous polyp of ascending colon    Adenomatous polyp of  colon    Acute metabolic encephalopathy 49/70/2637   Aspiration pneumonia ([place]) 04/24/2022   GI bleed 04/21/2022   Vasovagal syncope    Hemorrhagic shock (HCC)    Symptomatic anemia    Hematochezia    Acute blood loss anemia    Irregular heart beat 02/03/2022   Constipation 02/03/2022   Carpal tunnel syndrome 01/24/2022   Numbness of hand 01/24/2022   Ulnar neuropathy 01/24/2022   Skin lesion 01/16/2022   History of COVID-19 12/05/2021   Benign paroxysmal positional vertigo 09/14/2021   Palpitations 07/25/2021   Cardiac murmur 07/25/2021   History of chicken pox 07/22/2021   History of hemorrhoids 07/22/2021   Hypertension 07/22/2021   COPD (chronic obstructive pulmonary disease) ([place]) 07/22/2021   History of kidney stones 07/22/2021   History of skin cancer 85/88/5027   Complication of anesthesia 07/22/2021   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[place]) 07/22/2021   Other abnormal glucose 07/22/2021   Spondylitis ([place]) 07/22/2021   Greater trochanteric pain syndrome 07/22/2021   Vertigo 07/08/2021   Hypokalemia 07/08/2021   Trochanteric bursitis of left hip 06/30/2021   Cancer ([place]) 04/11/2021   Lumbar radiculopathy 03/02/2021   Gait instability 03/02/2021   Hip pain 03/02/2021   Situational depression 03/02/2021   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[place]) 02/09/2021   Urinary retention 11/26/2020   Cervical myelopathy ([place]) 11/24/2020   Body mass index (BMI) 25.0-25.9, adult 11/02/2020   Lumbar spondylosis 11/02/2020   Lumbago of lumbar region with sciatica 10/21/2020   Cystoid macular edema of both eyes 07/28/2020   Status post cervical spinal fusion 07/09/2020   History of total bilateral knee replacement 07/08/2020   Aortic atherosclerosis ([place]) 03/05/2020   Emphysema, unspecified ([place]) 03/05/2020   Arthritis 03/04/2020   Diverticulosis 03/03/2020   Internal hemorrhoids 03/03/2020   Weight loss 03/03/2020   Heme  positive stool 02/29/2020   Leg cramps 11/13/2019   Tibialis anterior tendon tear, nontraumatic 11/13/2019   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 07/29/2018   CPAP (continuous positive airway pressure) dependence 03/25/2018   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   Erectile dysfunction 06/20/2017   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 03/06/2017   Peripheral neuropathy 06/19/2016   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   Hearing loss  02/02/2016   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   Allergic rhinitis 02/02/2016   Atypical chest pain 11/24/2015   Preventative health care 11/24/2015   Onychomycosis 06/30/2015   Degenerative disc disease, lumbar 06/30/2015   Other allergic rhinitis 02/18/2015   Deviated nasal septum 02/18/2015   RLS (restless legs syndrome) 02/18/2015   GERD (gastroesophageal reflux disease) 09/18/2014   Hyperglycemia 03/03/2014   Rotator cuff tear 07/27/2013   Sleep apnea with use of continuous positive airway pressure (CPAP) 07/15/2013   Carotid stenosis 02/24/2013   Nonspecific abnormal electrocardiogram (ECG) (EKG) 01/06/2013   DJD (degenerative joint disease) of hip 12/24/2012   Lower GI bleed 12/12/2012   Right groin pain 11/29/2012   OSA (obstructive sleep apnea) 11/25/2012   Ventral hernia 07/08/2012   High risk medication use 01/18/2012   Dyspnea 06/12/2011   Hyperlipidemia with target LDL less than 130 12/27/2010   Osteoarthrosis, unspecified whether generalized or localized, pelvic region and thigh 07/25/2010   Osteoarthrosis, hand 06/13/2010   Anemia 12/17/2009   Hypothyroidism 05/13/2009   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   Benign essential hypertension 05/13/2009   Right inguinal hernia 05/13/2009   HIATAL HERNIA 05/13/2009   NEPHROLITHIASIS 05/13/2009   ROSACEA 05/13/2009    PCP: [name], NP  REFERRING PROVIDER: [name], NP  REFERRING DIAG: R26.81 (ICD-10-CM) - Gait instability    THERAPY DIAG:  Unsteadiness on feet  Other abnormalities of gait and mobility  Muscle weakness (generalized)  Repeated falls  Other low back pain  RATIONALE FOR EVALUATION AND TREATMENT:  Rehabilitation  ONSET DATE:  Fall 01/12/22 (2 falls in past 6 months)  SUBJECTIVE:   SUBJECTIVE STATEMENT: Feels more energized today with just some stiffness.  PAIN:  Are you having pain? No  PERTINENT HISTORY:  Aortic atherosclerosis; carotid stenosis; cervical myelopathy s/p ACDF 06/2020 and posterior cervical fusion 11/2020; peripheral neuropathy; lumbar DDD/spondylosis; lumbago with sciatica/lumbar radiculopathy; OA; B TKA; BTHA; L greater trochanteric pain syndrome; L RCR 2014; recent CTR surgery - early May; hearing loss; HTN; BPPV/vertigo; B sacroiliitis; skin cancer; COPD/emphysema; sleep apnea with CPAP; GERD; gout; HLD; LE cramps; RLS  PRECAUTIONS: None  WEIGHT BEARING RESTRICTIONS: No  FALLS: Has patient fallen in last 6 months? Yes. 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 No acute intracranial abnormality.  2. No acute fracture or traumatic subluxation of the cervical spine. L wrist x-ray: 1. Mild dorsal wrist soft tissue swelling with small ossific density  overlying the proximal carpal row. Correlate for triquetral fracture.  2. Moderate scaphotrapeziotrapezoid and mild first CMC joint osteoarthritis. L elbow x-ray: 1. No acute fracture or dislocation, left elbow.  2. Mild elevation of the anterior fat pad suggestive of a small joint effusion, which may be reactive in the setting of elbow osteoarthritis.  L shoulder x-rays: No acute osseous abnormality.  COGNITION: Overall cognitive status: Within functional limits for tasks assessed and Impaired: Memory: Impaired: Short term Procedural   SENSATION: Peripheral neuropathy in B feet  COORDINATION: Mildly impaired with slow movements  EDEMA:  Mild to moderate edema in distal B LE  MUSCLE LENGTH: Hamstrings: mod tight R>L ITB: mod tight B Piriformis: mod tight L>R Hip flexors: mod tight B Quads: mod tight B Heelcord: mod tight B  POSTURE:  rounded shoulders, forward head, and flexed trunk   LOWER EXTREMITY MMT:   Tested in sitting on eval 02/22/22, 03/22/22, 04/17/22 & 05/04/22 MMT Right 02/22/22 Left 02/22/22 Right 03/22/22 Left 03/22/22 Right 04/17/22 Left  04/17/22 Right  05/04/22 Left  05/04/22  Hip flexion 3+ 4- 3+ 4- 4- 4- 3+ 3+  Hip extension 3- 3- 3+ 3+ 3+ 3+ 3 3  Hip abduction 4- 4 4 4+ 4 4+ 4- 4  Hip adduction $RemoveBefor'4 4 4 'TsjDEFGMtDte$ 4+ 4+ 4+ 4 4  Hip internal rotation 3 3+ 3 3+ 3 3+ 3- 3  Hip external rotation 3+ 3+ 3+ 3+ 3+ 3+ 3 3+  Knee flexion 4- 4- 4 4 4+ 4+ 4- 4-  Knee extension 4- 4 4 4+ 4+ 4+ 4 4  Ankle dorsiflexion 4- 4 4- 4 4 4- 4- 4-  Ankle plantarflexion 3+  6 SLS HR 4 10 SLS HR 4+ 20 SLS HR Limited lift on last few reps 5 20 SLS HR 5- 18 SLS HR 5 20 SLS HR 4- 7 SLS HR Pt noting increased proximal LE pain 4+ 15 SLS HR  Ankle inversion 3+ 4- 3+ 4- 4- 4- 4- 4-  Ankle eversion 3+ 4- 3+ 4- 4- 4- 3+ 3+  (Blank rows = not tested)  BED MOBILITY:  Sit to supine SBA Supine to sit SBA  TRANSFERS: Assistive device utilized: Single point cane and None  Sit to stand: Modified  independence Stand to sit: Modified independence Chair to chair: Modified independence Floor:  NT  GAIT: Gait pattern: step through pattern, decreased stride length, decreased hip/knee flexion- Right, decreased hip/knee flexion- Left, decreased trunk rotation, and wide BOS Distance walked: 80 Assistive device utilized: Single point cane Level of assistance: SBA Comments: Gait speed: 2.10 ft/sec with SPC, 2.31 ft/sec w/o AD  RAMP: Level of Assistance:  NT Assistive device utilized:    Ramp Comments:   CURB:  Level of Assistance:  NT Assistive device utilized:    Curb Comments:   STAIRS:  Level of Assistance:  NT  Stair Negotiation Technique:   Number of Stairs:    Height of Stairs:   Comments:   FUNCTIONAL TESTS:  5 times sit to stand: 13.0 sec Timed up and go (TUG): 13.53 sec with SPC; 14.19 sec w/o AD 10 meter walk test: 15.60 sec with SPC; 13.04 sec w/o AD Berg Balance Scale: 39/56 - scores 37-45 indicate significant risk for falls (>80%) Dynamic Gait Index: 11/24; Scores of 19 or less are predictive of falls in older community living adults  PATIENT SURVEYS:  ABC scale 55% of self confidence; 45% impairment  TODAY'S TREATMENT:  05/25/22 TherEx: Nustep L6x74min Seated ball squeeze x 15 Seated HS curl red TB 2x10 Sidestep 2x15 ft no UE support - close supervision Step ups on 6' step 2 x 10 bil Sit to stand x 10   05/18/22 THERAPEUTIC EXERCISE: to improve flexibility, strength and mobility.  Verbal and tactile cues throughout for technique. NuStep - L6 x 76 min (UE/LE)  R/L Fitter leg press (1 black/1 blue) x 20 each Standing R/L Fitter hip ABD (1 black) x15 each - UE support on back of chair Standing R/L Fitter hip extension (1 black) x15 each - UE support on back of chair Seated R/L RTB hamstring curls x 15 each Seated RTB B hip ABD/ER clam 2 x 10 Standing RTB rows 10 x 3", 2 sets - cues for upright posture and to focus on scap and core/abdominal muscle  activation, 2nd set with R fwd foot staggered stance Standing RTB B scap retraction + shoulder extension to neutral 10 x 3", 2 sets -- cues for upright posture and to focus on scap and core/abdominal muscle activation, 2nd set with L fwd foot staggered stance   05/11/22 THERAPEUTIC EXERCISE: to improve flexibility, strength and mobility.  Verbal and tactile cues throughout for technique. NuStep - L5 x 6 min (UE/LE) Seated on dynadisc with feet on Airex pad:  - RTB rows 10 x 3", 2 sets - cues to avoid excess shoulder extension and focus on scap and core/abdominal muscle activation  - RTB B scap retraction + shoulder extension to neutral 10 x 3", 2 sets - cues to slow pace and pause to increase muscle activation  - March with 2# ankle cuff wts x 10  - Alt LAQ with 1# cuff wt on R, 2# on L x 10 each  - Bil RTB pallof press x 10 each side  - Hip ADD ball squeeze + alt hip IR with YTB at ankles x 10  - Alt hip ABD/ER unilateral clam with YTB at knees 2 x 10  NEUROMUSCULAR RE-EDUCATION: To improve balance, proprioception, coordination, and reduce fall risk. Standing on Airex pad with CGA/SBA of PT:  - Heel-toe weight shift x 10  - Lateral weight shift x 10  - Step in place x 10  - Alt toe clears to 9" stool x 5  - Bean bag reach and toss 2 x 12, 1 set each with R and L UE   05/09/22 Therapeutic Exercise: Bike L1x88min Seated LAQ x 10 bil STS x 10 Standing march/hip abd/hip ext 2x10 all with 2# weight at counter Seated flexion stretch with orange pball 5x10" Seated ab set with orange pball 10x with 5 sec hold Seated chop 2x5 with yellow weight ball Seated chest press with yellow weight ball 2x10   PATIENT EDUCATION: Education details: ongoing PT POC Person educated: Patient Education method: Explanation Education comprehension: verbalized understanding   HOME EXERCISE PROGRAM: Access Code: [email or web address] URL: [email or web address] Date: 03/22/2022 Prepared by: Annie Paras  Exercises - Standing Gastroc Stretch at Counter  - 1-2 x daily - 7 x weekly - 2-3 reps - 30 sec hold - Standing Soleus Stretch at Counter  - 1-2 x daily - 7 x weekly - 2-3 reps - 30 sec hold - Hip Flexor Stretch on Step  - 1-2 x daily - 7 x weekly - 2-3 reps - 30 sec hold - Side Stepping with Resistance at Ankles and Counter Support  - 1 x daily - 7 x weekly - 2 sets - 10 reps - Forward and Backward Step Over with Counter Support  - 1 x daily - 7 x weekly - 2 sets - 10 reps - Turning in Corner 360  - 1 x daily - 7 x weekly - 2 sets - 5 reps - Tandem Stance in Corner  - 1 x daily - 7 x weekly - 2 reps - 30 sec hold -  Single Leg Stance with Support  - 1 x daily - 7 x weekly - 2 sets - 30 sec hold - Squat with Chair Touch  - 1 x daily - 7 x weekly - 2 sets - 10 reps - 3 sec hold - Seated Hamstring Stretch  - 1-2 x daily - 7 x weekly - 2 sets - 30 sec hold - Seated Hip Adductor Stretch  - 1-2 x daily - 7 x weekly - 3 reps - 30 sec hold - Seated Figure 4 Piriformis Stretch  - 1-2 x daily - 7 x weekly - 2 sets - 30 sec hold - Supine Piriformis Stretch with Foot on Ground  - 1-2 x daily - 7 x weekly - 2-3 reps - 30 sec hold - Supine Lower Trunk Rotation  - 1 x daily - 7 x weekly - 2 sets - 30 sec hold - Bent Knee Fallouts with Alternating Legs  - 1 x daily - 7 x weekly - 2 sets - 10 reps - 3 sec hold - Seated Hip Abduction with Resistance  - 1 x daily - 3-4 x weekly - 2 sets - 10 reps - 3-5 sec hold - Seated Single Leg Hip Abduction with Resistance  - 1 x daily - 3-4 x weekly - 2 sets - 10 reps - 3 sec hold - Seated March with Resistance  - 1 x daily - 3-4 x weekly - 2 sets - 10 reps - 3 sec hold - Seated Shoulder Row with Anchored Resistance  - 1 x daily - 3-4 x weekly - 2 sets - 10 reps - 3-5 hold hold  Patient Education - Check for Safety   ASSESSMENT:  CLINICAL IMPRESSION: Pt reported to session feeling better since near fall last week. He reports that his energy is slowly coming  back to him now. Continued with progression of hip strengthening. Close supervision and cues required with sidesteps to increase step length. Postural cues required with step ups as well. Pt had no issues with interventions today and slowly recovering back to function.   OBJECTIVE IMPAIRMENTS Abnormal gait, decreased activity tolerance, decreased balance, decreased endurance, decreased knowledge of condition, decreased knowledge of use of DME, decreased mobility, difficulty walking, decreased ROM, decreased strength, decreased safety awareness, increased fascial restrictions, impaired perceived functional ability, increased muscle spasms, impaired flexibility, improper body mechanics, postural dysfunction, and pain.   ACTIVITY LIMITATIONS carrying, lifting, bending, standing, squatting, stairs, transfers, and locomotion level  PARTICIPATION LIMITATIONS: cleaning, laundry, interpersonal relationship, community activity, and yard work  PERSONAL FACTORS Age, Fitness, Past/current experiences, Time since onset of injury/illness/exacerbation, and 3+ comorbidities: Aortic atherosclerosis; carotid stenosis; cervical myelopathy s/p ACDF 06/2020 and posterior cervical fusion 11/2020; peripheral neuropathy; lumbar DDD/spondylosis; lumbago with sciatica/lumbar radiculopathy; OA; B TKA; BTHA; L greater trochanteric pain syndrome; L RCR 2014; recent CTR surgery - early May; hearing loss; HTN; BPPV/vertigo; B sacroiliitis; skin cancer; COPD/emphysema; sleep apnea with CPAP; GERD; gout; HLD; LE cramps; RLS  are also affecting patient's functional outcome.   REHAB POTENTIAL: Good  CLINICAL DECISION MAKING: Evolving/moderate complexity  EVALUATION COMPLEXITY: Moderate  GOALS: Goals reviewed with patient? Yes  SHORT TERM GOALS: Target date: 03/22/2022, extended to 06/01/22   Patient will be independent with initial HEP. Baseline:  Goal status: MET  03/22/22  2.  Patient will demonstrate decreased fall risk by  scoring > 45/56 on Berg. Baseline: 39/56 (eval - 02/22/22); 43/56 (03/22/22); 48/56 (04/17/22); decreased to 40/56 (05/04/22) following hospitalization Goal status: IN PROGRESS  05/04/22 - 40/56  3.  Patient will be educated on strategies to decrease risk of falls.  Baseline:  Goal status: MET  03/22/22 - Education provided last visit - Pt reports no concerns as he went through the home checklist other than the bathroom mat non-skid backing has disintegrated from repeated washing (he states he pushes it to the side while he is in the BR)   LONG TERM GOALS: Target date: 04/19/2022, extended to 05/29/2022, then 06/29/22   Patient will be independent with advanced/ongoing HEP to improve outcomes and carryover.  Baseline:  Goal status: IN PROGRESS  2.  Patient will be able to ambulate 600' with LRAD with good safety to access community.  Baseline:  Goal status: IN PROGRESS  3.  Patient will be able to step up/down curb safely with LRAD for safety with community ambulation.  Baseline:  Goal status: MET  04/17/22  4.  Patient will demonstrate improved functional LE strength as demonstrated by overall MMT >/= 4 to 4+/5. Baseline:  Goal status: IN PROGRESS  05/04/22 - Declined following hospitalization (refer to MMT chart)  5.  Patient will demonstrate at least 19/24 on DGI to improve gait stability and reduce risk for falls. Baseline: 11/24 (eval); 16/24 (03/31/22); 18/24 (04/17/22); 12/24 (05/04/22) Goal status: IN PROGRESS  05/04/22 - Decreased to 12/24 following hospitalization  6.  Patient will score >/= 52/56 on Berg Balance test to demonstrate lower risk of falls. (MCID= 8 points) .  Baseline: 39/56 (eval - 02/22/22); 43/56 (03/22/22); 48/56 (04/17/22); 40/56 (05/04/22) Goal status: IN PROGRESS  05/04/22 - Decreased to 40/56 following hospitalization  7.  Patient will report >67% of self confidence on ABC scale to demonstrate improved functional ability. Baseline: 55% self confidence (eval); 53.8 %  (04/17/22) Goal status: IN PROGRESS  8.  Patient will demonstrate gait speed of >/= 2.62 ft/sec (0.80 m/s) to be a safe community ambulator with decreased risk for recurrent falls.  Baseline: 2.10 ft/sec with SPC, 2.31 ft/sec w/o AD (eval); 2.90 ft/sec with SPC; 2.92 ft/sec w/o AD (03/31/22); 2.99 ft/sec with SPC; 3.14 ft/sec w/o AD (04/17/22); 2.77 ft/sec with SPC; 3.09 ft/sec w/o AD but much more unsteady (05/04/22) Goal status: MET  03/31/22   PLAN: PT FREQUENCY: 2x/week  PT DURATION: 7-8 weeks  PLANNED INTERVENTIONS: Therapeutic exercises, Therapeutic activity, Neuromuscular re-education, Balance training, Gait training, Patient/Family education, Joint mobilization, Stair training, Vestibular training, Canalith repositioning, Dry Needling, Electrical stimulation, Spinal manipulation, Cryotherapy, Moist heat, Taping, Ultrasound, Ionotophoresis 4mg /ml Dexamethasone, Manual therapy, and Re-evaluation  PLAN FOR NEXT SESSION:  progress lumbopelvic/LE flexibility and strengthening; balance training with close guarding; HEP review/update/modification as needed   Artist Pais, PTA 05/25/2022, 11:51 AM

## 2022-05-27 ENCOUNTER — Ambulatory Visit (HOSPITAL_BASED_OUTPATIENT_CLINIC_OR_DEPARTMENT_OTHER)
Admission: RE | Admit: 2022-05-27 | Discharge: 2022-05-27 | Disposition: A | Discharge status: Home / Self Care | Payer: Medicare HMO | Source: Ambulatory Visit | Attending: Neurosurgery | Admitting: Neurosurgery

## 2022-05-27 DIAGNOSIS — G5600 Carpal tunnel syndrome, unspecified upper limb: Secondary | ICD-10-CM | POA: Diagnosis not present

## 2022-05-27 DIAGNOSIS — R200 Anesthesia of skin: Secondary | ICD-10-CM | POA: Diagnosis not present

## 2022-05-27 DIAGNOSIS — M7989 Other specified soft tissue disorders: Secondary | ICD-10-CM | POA: Diagnosis not present

## 2022-05-27 DIAGNOSIS — M19042 Primary osteoarthritis, left hand: Secondary | ICD-10-CM | POA: Diagnosis not present

## 2022-05-27 DIAGNOSIS — M65842 Other synovitis and tenosynovitis, left hand: Secondary | ICD-10-CM | POA: Diagnosis not present

## 2022-05-28 ENCOUNTER — Ambulatory Visit (INDEPENDENT_AMBULATORY_CARE_PROVIDER_SITE_OTHER): Payer: Medicare HMO | Admitting: Neurology

## 2022-05-28 DIAGNOSIS — G4731 Primary central sleep apnea: Secondary | ICD-10-CM | POA: Diagnosis not present

## 2022-05-28 DIAGNOSIS — J439 Emphysema, unspecified: Secondary | ICD-10-CM

## 2022-05-28 DIAGNOSIS — G959 Disease of spinal cord, unspecified: Secondary | ICD-10-CM

## 2022-05-28 DIAGNOSIS — Z9989 Dependence on other enabling machines and devices: Secondary | ICD-10-CM

## 2022-05-28 DIAGNOSIS — G4733 Obstructive sleep apnea (adult) (pediatric): Secondary | ICD-10-CM

## 2022-05-28 DIAGNOSIS — G4739 Other sleep apnea: Secondary | ICD-10-CM

## 2022-05-29 ENCOUNTER — Encounter: Payer: Self-pay | Admitting: Physical Therapy

## 2022-05-29 ENCOUNTER — Ambulatory Visit: Payer: Medicare HMO | Admitting: Physical Therapy

## 2022-05-29 DIAGNOSIS — R2689 Other abnormalities of gait and mobility: Secondary | ICD-10-CM | POA: Diagnosis not present

## 2022-05-29 DIAGNOSIS — R296 Repeated falls: Secondary | ICD-10-CM | POA: Diagnosis not present

## 2022-05-29 DIAGNOSIS — M6281 Muscle weakness (generalized): Secondary | ICD-10-CM

## 2022-05-29 DIAGNOSIS — R2681 Unsteadiness on feet: Secondary | ICD-10-CM

## 2022-05-29 DIAGNOSIS — M5459 Other low back pain: Secondary | ICD-10-CM

## 2022-05-29 NOTE — Therapy (Signed)
OUTPATIENT PHYSICAL THERAPY  TREATMENT    Patient Name: [name] Pratt MRN: [id number] DOB:1940-11-23, 81 y.o., male Today's Date: 05/29/2022      PT End of Session - 05/29/22 1108     Visit Number 18    Number of Visits 27    Date for PT Re-Evaluation 06/29/22    Authorization Type [name]    Authorization Time Period 02/24/22 - 07/18/22    Authorization - Visit Number 17    Authorization - Number of Visits 26    Progress Note Due on Visit 23   Recert completed on visit #13 - 05/04/22   PT Start Time 1108    PT Stop Time 1148    PT Time Calculation (min) 40 min    Activity Tolerance Patient tolerated treatment well    Behavior During Therapy [facility] for tasks assessed/performed                     Past Medical History:  Diagnosis Date   Allergic rhinitis 02/02/2016   Anemia 12/17/2009   Formatting of this note might be different from the original. Anemia  10/1 IMO update   Aortic atherosclerosis ([place]) 03/05/2020   Arthritis    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   Atypical chest pain 11/24/2015   Benign essential hypertension 05/13/2009   Qualifier: Diagnosis of  By: Larwance Sachs of this note might be different from the original. Hypertension   Benign paroxysmal positional vertigo 09/14/2021   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[place]) 07/22/2021   Body mass index (BMI) 25.0-25.9, adult 11/02/2020   Cancer ([place])    skin cancer   Cardiac murmur 07/25/2021   Carotid stenosis    a. Carotid U/S 4/09: LICA < 81%, RICA 19-14%;  b.  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   Cystoid  macular edema of both eyes 07/28/2020   Degenerative disc disease, lumbar 06/30/2015   Deviated nasal septum 02/18/2015   Diverticulosis 03/03/2020   DJD (degenerative joint disease) of hip 12/24/2012   Dyspnea 06/12/2011   Emphysema, unspecified ([place]) 03/05/2020   Erectile dysfunction 06/20/2017   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[place]) 02/09/2021   Gait disorder 03/02/2021   GERD (gastroesophageal reflux disease)    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   Qualifier: Diagnosis of  By: Burnett Kanaris     Greater trochanteric pain syndrome 07/22/2021   Hand pain, left 01/16/2022   Hearing loss 02/02/2016   Heme positive stool 02/29/2020   HIATAL HERNIA 05/13/2009   Qualifier: Diagnosis of  By: Burnett Kanaris     High risk medication use 01/18/2012   Hip pain 03/02/2021   History of chicken pox    History of COVID-19 12/05/2021   History of hemorrhoids    History of kidney stones    History of skin cancer 07/22/2021   skin cancer   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 Cardiology - Dr Frances Nickels at [facility] cardiology  ICD-10 cut over   Hypertension    under control   Hypokalemia 07/08/2021   Hypothyroidism    Internal hemorrhoids 03/03/2020   Leg cramps 11/13/2019   Lower GI bleed 12/12/2012   Lumbago of lumbar region with sciatica 10/21/2020   Lumbar radiculopathy 03/02/2021   Lumbar spondylosis 11/02/2020   NEPHROLITHIASIS 05/13/2009   Qualifier: Diagnosis of  By: Burnett Kanaris     Nonspecific abnormal electrocardiogram (ECG) (EKG) 01/06/2013   Numbness of hand 01/24/2022   Onychomycosis 06/30/2015   Osteoarthrosis, 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 Osteoarthritis Of The Hand  10/1 IMO update   Osteoarthrosis,  unspecified whether generalized or localized, pelvic region and thigh 07/25/2010   Formatting of this note might be different from the original. Osteoarthritis Of The Hip   Other abnormal glucose 07/22/2021   Other allergic rhinitis 02/18/2015   Palpitations 07/25/2021   Peripheral neuropathy 06/19/2016   Preventative health care 11/24/2015   Right groin pain 11/29/2012   Right inguinal hernia 05/13/2009   Qualifier: Diagnosis of  By: Burnett Kanaris     RLS (restless legs syndrome) 02/18/2015   Rosacea    Rotator cuff tear 07/27/2013   Situational depression 03/02/2021   Skin lesion 01/16/2022   Sleep apnea with use of continuous positive airway pressure (CPAP) 07/15/2013   CPAP set to  7 cm water,  Residual AHi was 7.8  With no leak.  User time 6 hours.  479 days , not used only 12 days - highly compliant 06-23-13  .    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   Urinary retention 11/26/2020   Ventral hernia 07/08/2012   Vertigo 07/08/2021   Weight loss 03/03/2020   Past Surgical History:  Procedure Laterality Date   ABDOMINAL HERNIA REPAIR  07/15/12   Dr [name]   ANTERIOR CERVICAL DECOMP/DISCECTOMY FUSION N/A 07/09/2020   Procedure: ANTERIOR CERVICAL DECOMPRESSION AND FUSION CERVICAL THREE-FOUR.;  Surgeon: [name], MD;  Location: [place];  Service: Neurosurgery;  Laterality: N/A;  anterior   BROW LIFT  05/07/01   COLONOSCOPY WITH PROPOFOL N/A 04/24/2022   Procedure: COLONOSCOPY WITH PROPOFOL;  Surgeon: Thornton Park, MD;  Location: [place];  Service: Gastroenterology;  Laterality: N/A;   COLONOSCOPY WITH PROPOFOL N/A 04/26/2022   Procedure:  COLONOSCOPY WITH PROPOFOL;  Surgeon: Thornton Park, MD;  Location: [place];  Service: Gastroenterology;  Laterality: N/A;   CYSTOSCOPY WITH URETEROSCOPY AND STENT PLACEMENT Right 01/28/2014   Procedure: CYSTOSCOPY WITH URETEROSCOPY, BASKET RETRIVAL AND  STENT PLACEMENT;  Surgeon: [name], MD;  Location: [facility];  Service: Urology;  Laterality: Right;   epidural injections     multiple procedures   ESOPHAGEAL DILATION  1993 and 1994   multiple times   EYE SURGERY Bilateral 03/25/10, 2012   cataract removal   HEMORRHOID SURGERY  2014   HIATAL HERNIA REPAIR  12/21/93   HOLMIUM LASER APPLICATION Right 9/62/9528   Procedure: HOLMIUM LASER APPLICATION;  Surgeon: [name], MD;  Location: [facility];  Service: Urology;  Laterality: Right;   INGUINAL HERNIA REPAIR Right 07/15/12   Dr [name], x2   JOINT REPLACEMENT  07/25/04   right knee   JOINT REPLACEMENT  07/13/10   left knee   KNEE SURGERY  1995   LAPAROSCOPIC CHOLECYSTECTOMY  09/20/06   with intraoperative cholangiogram and right inguinal herniorrhaphy with mesh    LIGAMENT REPAIR Left 07/2013   shoulder   LITHOTRIPSY     x2   NASAL SEPTUM SURGERY  1975   POLYPECTOMY  04/26/2022   Procedure: POLYPECTOMY;  Surgeon: Thornton Park, MD;  Location: [place];  Service: Gastroenterology;;   POSTERIOR CERVICAL FUSION/FORAMINOTOMY N/A 11/24/2020   Procedure: Posterior Cervical Laminectomy Cervical three-four, Cervical four-five with lateral mass fusion;  Surgeon: [name], MD;  Location: [place];  Service: Neurosurgery;  Laterality: N/A;   REFRACTIVE SURGERY Bilateral    Right total hip replacement  4/14   SKIN CANCER DESTRUCTION     nose, ear   TONSILLECTOMY  as child   TOTAL HIP ARTHROPLASTY Left    URETHRAL DILATION  1991   [name]   Patient Active Problem List   Diagnosis Date Noted   Hand weakness 05/02/2022   Acute kidney injury ([place]) 05/02/2022   Adenomatous polyp of ascending colon    Adenomatous polyp of  colon    Acute metabolic encephalopathy 49/70/2637   Aspiration pneumonia ([place]) 04/24/2022   GI bleed 04/21/2022   Vasovagal syncope    Hemorrhagic shock (HCC)    Symptomatic anemia    Hematochezia    Acute blood loss anemia    Irregular heart beat 02/03/2022   Constipation 02/03/2022   Carpal tunnel syndrome 01/24/2022   Numbness of hand 01/24/2022   Ulnar neuropathy 01/24/2022   Skin lesion 01/16/2022   History of COVID-19 12/05/2021   Benign paroxysmal positional vertigo 09/14/2021   Palpitations 07/25/2021   Cardiac murmur 07/25/2021   History of chicken pox 07/22/2021   History of hemorrhoids 07/22/2021   Hypertension 07/22/2021   COPD (chronic obstructive pulmonary disease) ([place]) 07/22/2021   History of kidney stones 07/22/2021   History of skin cancer 85/88/5027   Complication of anesthesia 07/22/2021   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[place]) 07/22/2021   Other abnormal glucose 07/22/2021   Spondylitis ([place]) 07/22/2021   Greater trochanteric pain syndrome 07/22/2021   Vertigo 07/08/2021   Hypokalemia 07/08/2021   Trochanteric bursitis of left hip 06/30/2021   Cancer ([place]) 04/11/2021   Lumbar radiculopathy 03/02/2021   Gait instability 03/02/2021   Hip pain 03/02/2021   Situational depression 03/02/2021   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[place]) 02/09/2021   Urinary retention 11/26/2020   Cervical myelopathy ([place]) 11/24/2020   Body mass index (BMI) 25.0-25.9, adult 11/02/2020   Lumbar spondylosis 11/02/2020   Lumbago of lumbar region with sciatica 10/21/2020   Cystoid macular edema of both eyes 07/28/2020   Status post cervical spinal fusion 07/09/2020   History of total bilateral knee replacement 07/08/2020   Aortic atherosclerosis ([place]) 03/05/2020   Emphysema, unspecified ([place]) 03/05/2020   Arthritis 03/04/2020   Diverticulosis 03/03/2020   Internal hemorrhoids 03/03/2020   Weight loss 03/03/2020   Heme  positive stool 02/29/2020   Leg cramps 11/13/2019   Tibialis anterior tendon tear, nontraumatic 11/13/2019   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 07/29/2018   CPAP (continuous positive airway pressure) dependence 03/25/2018   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   Erectile dysfunction 06/20/2017   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 03/06/2017   Peripheral neuropathy 06/19/2016   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   Hearing loss  02/02/2016   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   Allergic rhinitis 02/02/2016   Atypical chest pain 11/24/2015   Preventative health care 11/24/2015   Onychomycosis 06/30/2015   Degenerative disc disease, lumbar 06/30/2015   Other allergic rhinitis 02/18/2015   Deviated nasal septum 02/18/2015   RLS (restless legs syndrome) 02/18/2015   GERD (gastroesophageal reflux disease) 09/18/2014   Hyperglycemia 03/03/2014   Rotator cuff tear 07/27/2013   Sleep apnea with use of continuous positive airway pressure (CPAP) 07/15/2013   Carotid stenosis 02/24/2013   Nonspecific abnormal electrocardiogram (ECG) (EKG) 01/06/2013   DJD (degenerative joint disease) of hip 12/24/2012   Lower GI bleed 12/12/2012   Right groin pain 11/29/2012   OSA (obstructive sleep apnea) 11/25/2012   Ventral hernia 07/08/2012   High risk medication use 01/18/2012   Dyspnea 06/12/2011   Hyperlipidemia with target LDL less than 130 12/27/2010   Osteoarthrosis, unspecified whether generalized or localized, pelvic region and thigh 07/25/2010   Osteoarthrosis, hand 06/13/2010   Anemia 12/17/2009   Hypothyroidism 05/13/2009   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   Benign essential hypertension 05/13/2009   Right inguinal hernia 05/13/2009   HIATAL HERNIA 05/13/2009   NEPHROLITHIASIS 05/13/2009   ROSACEA 05/13/2009    PCP: [name], NP  REFERRING PROVIDER: [name], NP  REFERRING DIAG: R26.81 (ICD-10-CM) - Gait instability    THERAPY DIAG:  Unsteadiness on feet  Other abnormalities of gait and mobility  Muscle weakness (generalized)  Repeated falls  Other low back pain  RATIONALE FOR EVALUATION AND TREATMENT:  Rehabilitation  ONSET DATE:  Fall 01/12/22 (2 falls in past 6 months)  SUBJECTIVE:   SUBJECTIVE STATEMENT: Pt reports he did a lot of walking yesterday and tolerated this okay. He had a sleep study last night and did not get the best night of sleep.   PAIN:  Are you having pain? No  PERTINENT HISTORY:  Aortic atherosclerosis; carotid stenosis; cervical myelopathy s/p ACDF 06/2020 and posterior cervical fusion 11/2020; peripheral neuropathy; lumbar DDD/spondylosis; lumbago with sciatica/lumbar radiculopathy; OA; B TKA; BTHA; L greater trochanteric pain syndrome; L RCR 2014; recent CTR surgery - early May; hearing loss; HTN; BPPV/vertigo; B sacroiliitis; skin cancer; COPD/emphysema; sleep apnea with CPAP; GERD; gout; HLD; LE cramps; RLS  PRECAUTIONS: None  WEIGHT BEARING RESTRICTIONS: No  FALLS: Has patient fallen in last 6 months? Yes. 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 No acute intracranial abnormality.  2. No acute fracture or traumatic subluxation of the cervical  spine. L wrist x-ray: 1. Mild dorsal wrist soft tissue swelling with small ossific density overlying the proximal carpal row. Correlate for triquetral fracture.  2. Moderate scaphotrapeziotrapezoid and mild first CMC joint osteoarthritis. L elbow x-ray: 1. No acute fracture or dislocation, left elbow.  2. Mild  elevation of the anterior fat pad suggestive of a small joint effusion, which may be reactive in the setting of elbow osteoarthritis. L shoulder x-rays: No acute osseous abnormality.  COGNITION: Overall cognitive status: Within functional limits for tasks assessed and Impaired: Memory: Impaired: Short term Procedural   SENSATION: Peripheral neuropathy in B feet  COORDINATION: Mildly impaired with slow movements  EDEMA:  Mild to moderate edema in distal B LE  MUSCLE LENGTH: Hamstrings: mod tight R>L ITB: mod tight B Piriformis: mod tight L>R Hip flexors: mod tight B Quads: mod tight B Heelcord: mod tight B  POSTURE:  rounded shoulders, forward head, and flexed trunk   LOWER EXTREMITY MMT:   Tested in sitting on eval 02/22/22, 03/22/22, 04/17/22 & 05/04/22 MMT Right 02/22/22 Left 02/22/22 Right 03/22/22 Left 03/22/22 Right 04/17/22 Left  04/17/22 Right  05/04/22 Left  05/04/22  Hip flexion 3+ 4- 3+ 4- 4- 4- 3+ 3+  Hip extension 3- 3- 3+ 3+ 3+ 3+ 3 3  Hip abduction 4- 4 4 4+ 4 4+ 4- 4  Hip adduction _0 4+ 4+ 4+ 4 4  Hip internal rotation 3 3+ 3 3+ 3 3+ 3- 3  Hip external rotation 3+ 3+ 3+ 3+ 3+ 3+ 3 3+  Knee flexion 4- 4- 4 4 4+ 4+ 4- 4-  Knee extension 4- 4 4 4+ 4+ 4+ 4 4  Ankle dorsiflexion 4- 4 4- 4 4 4- 4- 4-  Ankle plantarflexion 3+  6 SLS HR 4 10 SLS HR 4+ 20 SLS HR Limited lift on last few reps 5 20 SLS HR 5- 18 SLS HR 5 20 SLS HR 4- 7 SLS HR Pt noting increased proximal LE pain 4+ 15 SLS HR  Ankle inversion 3+ 4- 3+ 4- 4- 4- 4- 4-  Ankle eversion 3+ 4- 3+ 4- 4- 4- 3+ 3+  (Blank rows = not tested)  BED MOBILITY:  Sit to supine SBA Supine to sit  SBA  TRANSFERS: Assistive device utilized: Single point cane and None  Sit to stand: Modified independence Stand to sit: Modified independence Chair to chair: Modified independence Floor:  NT  GAIT: Gait pattern: step through pattern, decreased stride length, decreased hip/knee flexion- Right, decreased hip/knee flexion- Left, decreased trunk rotation, and wide BOS Distance walked: 80 Assistive device utilized: Single point cane Level of assistance: SBA Comments: Gait speed: 2.10 ft/sec with SPC, 2.31 ft/sec w/o AD  RAMP: Level of Assistance:  NT Assistive device utilized:    Ramp Comments:   CURB:  Level of Assistance:  NT Assistive device utilized:    Curb Comments:   STAIRS:  Level of Assistance:  NT  Stair Negotiation Technique:   Number of Stairs:    Height of Stairs:   Comments:   FUNCTIONAL TESTS:  5 times sit to stand: 13.0 sec Timed up and go (TUG): 13.53 sec with SPC; 14.19 sec w/o AD 10 meter walk test: 15.60 sec with SPC; 13.04 sec w/o AD Berg Balance Scale: 39/56 - scores 37-45 indicate significant risk for falls (>80%) Dynamic Gait Index: 11/24; Scores of 19 or less are predictive of falls in older community living adults  PATIENT SURVEYS:  ABC scale 55% of self confidence; 45% impairment  TODAY'S TREATMENT:   05/29/22 THERAPEUTIC EXERCISE: to improve flexibility, strength and mobility.  Verbal and tactile cues throughout for technique. NuStep - L6 x 6 min (UE/LE) Standing B heel-toe raises x 20, UE support on counter for all standing exercises Counter squats 2  x 10 Standing R/L hip extension with looped RTB at ankles 2 x 10 Standing R/L hip abduction with looped RTB at ankles 2 x 10 Standing R/L hip flexion march with looped RTB at midfeet 2 x 10 Seated RTB alt unilateral hip ABD/ER clam 2 x 10  NEUROMUSCULAR RE-EDUCATION: To improve balance, coordination, and reduce fall risk. B Side stepping along counter with hands hovering 4 x 10 ft Tandem  stance 2 x 30 sec - intermittent UE support on counter Tandem gait along counter 2 x 10 ft fwd, 1 x 10 ft backwards Alt toe clears to 9" step with intermittent UE support on counter x 20 Reciprocal fwd step-over 3 pairs of balance pebbles x 10, intermittent UE support on counter Lateral step-over 3 pairs of balance pebbles x 10, UE support on counter   05/25/22 TherEx: Nustep [id number] Seated ball squeeze x 15 Seated HS curl red TB 2x10 Sidestep 2x15 ft no UE support - close supervision Step ups on 6' step 2 x 10 bil Sit to stand x 10    05/18/22 THERAPEUTIC EXERCISE: to improve flexibility, strength and mobility.  Verbal and tactile cues throughout for technique. NuStep - L6 x 7 min (UE/LE) R/L Fitter leg press (1 black/1 blue) x 20 each Standing R/L Fitter hip ABD (1 black) x15 each - UE support on back of chair Standing R/L Fitter hip extension (1 black) x15 each - UE support on back of chair Seated R/L RTB hamstring curls x 15 each Seated RTB B hip ABD/ER clam 2 x 10 Standing RTB rows 10 x 3", 2 sets - cues for upright posture and to focus on scap and core/abdominal muscle activation, 2nd set with R fwd foot staggered stance Standing RTB B scap retraction + shoulder extension to neutral 10 x 3", 2 sets -- cues for upright posture and to focus on scap and core/abdominal muscle activation, 2nd set with L fwd foot staggered stance   PATIENT EDUCATION: Education details: ongoing PT POC Person educated: Patient Education method: Explanation Education comprehension: verbalized understanding   HOME EXERCISE PROGRAM: Access Code: [email or web address]: [email or web address] Date: 03/22/2022 Prepared by: JAnnie Paras Exercises - Standing Gastroc Stretch at Counter  - 1-2 x daily - 7 x weekly - 2-3 reps - 30 sec hold - Standing Soleus Stretch at Counter  - 1-2 x daily - 7 x weekly - 2-3 reps - 30 sec hold - Hip Flexor Stretch on Step  - 1-2 x daily - 7 x weekly - 2-3 reps - 30  sec hold - Side Stepping with Resistance at Ankles and Counter Support  - 1 x daily - 7 x weekly - 2 sets - 10 reps - Forward and Backward Step Over with Counter Support  - 1 x daily - 7 x weekly - 2 sets - 10 reps - Turning in Corner 360  - 1 x daily - 7 x weekly - 2 sets - 5 reps - Tandem Stance in Corner  - 1 x daily - 7 x weekly - 2 reps - 30 sec hold - Single Leg Stance with Support  - 1 x daily - 7 x weekly - 2 sets - 30 sec hold - Squat with Chair Touch  - 1 x daily - 7 x weekly - 2 sets - 10 reps - 3 sec hold - Seated Hamstring Stretch  - 1-2 x daily - 7 x weekly - 2 sets - 30 sec hold - Seated Hip Adductor Stretch  -  1-2 x daily - 7 x weekly - 3 reps - 30 sec hold - Seated Figure 4 Piriformis Stretch  - 1-2 x daily - 7 x weekly - 2 sets - 30 sec hold - Supine Piriformis Stretch with Foot on Ground  - 1-2 x daily - 7 x weekly - 2-3 reps - 30 sec hold - Supine Lower Trunk Rotation  - 1 x daily - 7 x weekly - 2 sets - 30 sec hold - Bent Knee Fallouts with Alternating Legs  - 1 x daily - 7 x weekly - 2 sets - 10 reps - 3 sec hold - Seated Hip Abduction with Resistance  - 1 x daily - 3-4 x weekly - 2 sets - 10 reps - 3-5 sec hold - Seated Single Leg Hip Abduction with Resistance  - 1 x daily - 3-4 x weekly - 2 sets - 10 reps - 3 sec hold - Seated March with Resistance  - 1 x daily - 3-4 x weekly - 2 sets - 10 reps - 3 sec hold - Seated Shoulder Row with Anchored Resistance  - 1 x daily - 3-4 x weekly - 2 sets - 10 reps - 3-5 hold hold  Patient Education - Check for Safety   ASSESSMENT:  CLINICAL IMPRESSION: [name] "[name]" reports improving energy and activity tolerance allowing him to do a lot of walking with his granddaughter yesterday. He was able to tolerate most of today's exercises and balance activities in standing but did require UE support for most activities and did experience some fatigue toward the end of some of the sets which added to his instability. He notes similar  fatigue with his HEP, therefore pt instructed to rotate through HEP exercises in smaller groups on alternating days to reduce fatigue and allow for recovery time. [name] will continue to benefit from skilled PT to improve core/LE strength and balance to reduce his risk for future falls.   OBJECTIVE IMPAIRMENTS Abnormal gait, decreased activity tolerance, decreased balance, decreased endurance, decreased knowledge of condition, decreased knowledge of use of DME, decreased mobility, difficulty walking, decreased ROM, decreased strength, decreased safety awareness, increased fascial restrictions, impaired perceived functional ability, increased muscle spasms, impaired flexibility, improper body mechanics, postural dysfunction, and pain.   ACTIVITY LIMITATIONS carrying, lifting, bending, standing, squatting, stairs, transfers, and locomotion level  PARTICIPATION LIMITATIONS: cleaning, laundry, interpersonal relationship, community activity, and yard work  PERSONAL FACTORS Age, Fitness, Past/current experiences, Time since onset of injury/illness/exacerbation, and 3+ comorbidities: Aortic atherosclerosis; carotid stenosis; cervical myelopathy s/p ACDF 06/2020 and posterior cervical fusion 11/2020; peripheral neuropathy; lumbar DDD/spondylosis; lumbago with sciatica/lumbar radiculopathy; OA; B TKA; BTHA; L greater trochanteric pain syndrome; L RCR 2014; recent CTR surgery - early May; hearing loss; HTN; BPPV/vertigo; B sacroiliitis; skin cancer; COPD/emphysema; sleep apnea with CPAP; GERD; gout; HLD; LE cramps; RLS  are also affecting patient's functional outcome.   REHAB POTENTIAL: Good  CLINICAL DECISION MAKING: Evolving/moderate complexity  EVALUATION COMPLEXITY: Moderate  GOALS: Goals reviewed with patient? Yes  SHORT TERM GOALS: Target date: 03/22/2022, extended to 06/01/22   Patient will be independent with initial HEP. Baseline:  Goal status: MET  03/22/22  2.  Patient will demonstrate decreased  fall risk by scoring > 45/56 on Berg. Baseline: 39/56 (eval - 02/22/22); 43/56 (03/22/22); 48/56 (04/17/22); decreased to 40/56 (05/04/22) following hospitalization Goal status: IN PROGRESS  05/04/22 - 40/56  3.  Patient will be educated on strategies to decrease risk of falls.  Baseline:  Goal status: MET  03/22/22 - Education provided last visit - Pt reports no concerns as he went through the home checklist other than the bathroom mat non-skid backing has disintegrated from repeated washing (he states he pushes it to the side while he is in the BR)   LONG TERM GOALS: Target date: 04/19/2022, extended to 05/29/2022, then 06/29/22   Patient will be independent with advanced/ongoing HEP to improve outcomes and carryover.  Baseline:  Goal status: IN PROGRESS  05/29/22 - Pt instructed to rotate through HEP exercise in smaller groups to reduce fatigue and allow for recovery time.  2.  Patient will be able to ambulate 600' with LRAD with good safety to access community.  Baseline:  Goal status: IN PROGRESS  3.  Patient will be able to step up/down curb safely with LRAD for safety with community ambulation.  Baseline:  Goal status: MET  04/17/22  4.  Patient will demonstrate improved functional LE strength as demonstrated by overall MMT >/= 4 to 4+/5. Baseline:  Goal status: IN PROGRESS  05/04/22 - Declined following hospitalization (refer to MMT chart)  5.  Patient will demonstrate at least 19/24 on DGI to improve gait stability and reduce risk for falls. Baseline: 11/24 (eval); 16/24 (03/31/22); 18/24 (04/17/22); 12/24 (05/04/22) Goal status: IN PROGRESS  05/04/22 - Decreased to 12/24 following hospitalization  6.  Patient will score >/= 52/56 on Berg Balance test to demonstrate lower risk of falls. (MCID= 8 points) .  Baseline: 39/56 (eval - 02/22/22); 43/56 (03/22/22); 48/56 (04/17/22); 40/56 (05/04/22) Goal status: IN PROGRESS  05/04/22 - Decreased to 40/56 following hospitalization  7.  Patient will report  >67% of self confidence on ABC scale to demonstrate improved functional ability. Baseline: 55% self confidence (eval); 53.8 % (04/17/22) Goal status: IN PROGRESS  8.  Patient will demonstrate gait speed of >/= 2.62 ft/sec (0.80 m/s) to be a safe community ambulator with decreased risk for recurrent falls.  Baseline: 2.10 ft/sec with SPC, 2.31 ft/sec w/o AD (eval); 2.90 ft/sec with SPC; 2.92 ft/sec w/o AD (03/31/22); 2.99 ft/sec with SPC; 3.14 ft/sec w/o AD (04/17/22); 2.77 ft/sec with SPC; 3.09 ft/sec w/o AD but much more unsteady (05/04/22) Goal status: MET  03/31/22   PLAN: PT FREQUENCY: 2x/week  PT DURATION: 7-8 weeks  PLANNED INTERVENTIONS: Therapeutic exercises, Therapeutic activity, Neuromuscular re-education, Balance training, Gait training, Patient/Family education, Joint mobilization, Stair training, Vestibular training, Canalith repositioning, Dry Needling, Electrical stimulation, Spinal manipulation, Cryotherapy, Moist heat, Taping, Ultrasound, Ionotophoresis 34m/ml Dexamethasone, Manual therapy, and Re-evaluation  PLAN FOR NEXT SESSION:  Reassess balance & strength; progress lumbopelvic/LE flexibility and strengthening; balance training with close guarding; HEP review/update/modification as needed   JPercival Spanish PT 05/29/2022, 12:01 PM

## 2022-06-02 ENCOUNTER — Ambulatory Visit: Payer: Medicare HMO

## 2022-06-05 ENCOUNTER — Encounter: Payer: Self-pay | Admitting: *Deleted

## 2022-06-05 ENCOUNTER — Telehealth: Payer: Self-pay | Admitting: *Deleted

## 2022-06-05 NOTE — Patient Instructions (Signed)
Visit Information  Thank you for taking time to visit with me today. Please don't hesitate to contact me if I can be of assistance to you.   Following are the goals we discussed today:   Goals Addressed             This Visit's Progress    COMPLETED: Care Coordination Activities: No follow up required   On track    Care Coordination Interventions: Evaluation of current treatment plan related to recent GI bleeding/ ongoing intermittent issues with constipation and patient's adherence to plan as established by provider Advised patient to provide appropriate vaccination information to provider or CM team member at next visit Advised patient to discuss intermittent constipation with GI and PCP providers: he reports taking stool softener and laxative as prescribed, still has constipation issues Reviewed medications with patient and discussed adherence: he reports adherence with all medications and reports a good general understanding of medications  Reviewed scheduled/upcoming provider appointments including 06/06/22- rehabilitation/ PT/OT; 06/07/22- PCP Assessed social determinant of health barriers Confirmed patient attended Medicare Annual Wellness visit 01/19/22; confirmed patient plans to obtain flu vaccine for upcoming 2023-24 flu/ winter season at time of upcoming PCP office visit 06/07/22; discussed and provided general/ basic education around infection prevention in light of upcoming 2023-24 flu/ winter season           If you are experiencing a [place] or [place] or need someone to talk to, please  call the Suicide and Crisis Lifeline: 988 call the [place] National Suicide Prevention Lifeline: 940-887-5411 or TTY: (630) 095-0974 TTY 810-307-4647) to talk to a trained counselor call 1-800-273-TALK (toll free, 24 hour hotline) go to [facility] [address], [place] [phone]) call the [place]:  [phone] call 911   Patient verbalizes understanding of instructions and care plan provided today and agrees to view in [place]. Active MyChart status and patient understanding of how to access instructions and care plan via MyChart confirmed with patient.     No further follow up required: no further/ ongoing care coordination needs identified today  Oneta Rack, RN, BSN, CCRN Alumnus RN CM Care Coordination/ Transition of [place] Management [phone]: direct office

## 2022-06-05 NOTE — Patient Outreach (Signed)
  Care Coordination   Initial Visit Note   06/05/2022 Name: [name] Pratt MRN: [id number] DOB: 05-01-1941  [name] Pratt is a 81 y.o. year old male who sees [name] Alar, NP for primary care. I spoke with  [name] Pratt by phone today.  What matters to the patients health and wellness today?  "I am doing pretty good; I have been much better since I got out of the hospital and had the colonoscopy, not further problems and I hope it stays that way.  I am still going to the PT for my right-sided weakness and will be starting the OT for my hand this week.  I am able to drive myself to appointments so I am recuperating well.  I am having an ongoing issue around being constipated, I plan to talk to [facility] about that this Wednesday, and I plan to get my flu shot on Wednesday too"  No further/ ongoing care coordination needs identified today    Goals Addressed             This Visit's Progress    COMPLETED: Care Coordination Activities: No follow up required   On track    Care Coordination Interventions: Evaluation of current treatment plan related to recent GI bleeding/ ongoing intermittent issues with constipation and patient's adherence to plan as established by provider Advised patient to provide appropriate vaccination information to provider or CM team member at next visit Advised patient to discuss intermittent constipation with GI and PCP providers: he reports taking stool softener and laxative as prescribed, still has constipation issues Reviewed medications with patient and discussed adherence: he reports adherence with all medications and reports a good general understanding of medications  Reviewed scheduled/upcoming provider appointments including 06/06/22- rehabilitation/ PT/OT; 06/07/22- PCP Assessed social determinant of health barriers Confirmed patient attended Medicare Annual Wellness visit 01/19/22; confirmed patient plans to obtain flu vaccine for upcoming  2023-24 flu/ winter season at time of upcoming PCP office visit 06/07/22; discussed and provided general/ basic education around infection prevention in light of upcoming 2023-24 flu/ winter season           SDOH assessments and interventions completed:  Yes  SDOH Interventions Today    Flowsheet Row Most Recent Value  SDOH Interventions   Food Insecurity Interventions Intervention Not Indicated  Transportation Interventions Intervention Not Indicated  [drives self]       Care Coordination Interventions Activated:  Yes  Care Coordination Interventions:  Yes, provided   Follow up plan: No further intervention required.   Encounter Outcome:  Pt. Visit Completed   [name] Rack, RN, BSN, CCRN Alumnus RN CM Care Coordination/ Transition of [place] Management [phone]: direct office

## 2022-06-06 ENCOUNTER — Encounter: Payer: Self-pay | Admitting: Physical Therapy

## 2022-06-06 ENCOUNTER — Ambulatory Visit: Payer: Medicare HMO | Admitting: Physical Therapy

## 2022-06-06 DIAGNOSIS — R2689 Other abnormalities of gait and mobility: Secondary | ICD-10-CM

## 2022-06-06 DIAGNOSIS — M6281 Muscle weakness (generalized): Secondary | ICD-10-CM

## 2022-06-06 DIAGNOSIS — R296 Repeated falls: Secondary | ICD-10-CM

## 2022-06-06 DIAGNOSIS — R2681 Unsteadiness on feet: Secondary | ICD-10-CM

## 2022-06-06 DIAGNOSIS — M5459 Other low back pain: Secondary | ICD-10-CM | POA: Diagnosis not present

## 2022-06-06 NOTE — Therapy (Signed)
OUTPATIENT PHYSICAL THERAPY  TREATMENT    Patient Name: [name] Pratt MRN: [id number] DOB:[birth year], 81 y.o., male Today's Date: 06/06/2022      PT End of Session - 06/06/22 1103     Visit Number 19    Number of Visits 27    Date for PT Re-Evaluation 06/29/22    Authorization Type [name]    Authorization Time Period 02/24/22 - 07/18/22    Authorization - Visit Number 18    Authorization - Number of Visits 26    Progress Note Due on Visit 23   Recert completed on visit #13 - 05/04/22   PT Start Time 1103    PT Stop Time 1150    PT Time Calculation (min) 47 min    Activity Tolerance Patient tolerated treatment well    Behavior During Therapy [facility] for tasks assessed/performed                      Past Medical History:  Diagnosis Date   Allergic rhinitis 02/02/2016   Anemia 12/17/2009   Formatting of this note might be different from the original. Anemia  10/1 IMO update   Aortic atherosclerosis ([place]) 03/05/2020   Arthritis    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   Atypical chest pain 11/24/2015   Benign essential hypertension 05/13/2009   Qualifier: Diagnosis of  By: Larwance Sachs of this note might be different from the original. Hypertension   Benign paroxysmal positional vertigo 09/14/2021   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[place]) 07/22/2021   Body mass index (BMI) 25.0-25.9, adult 11/02/2020   Cancer ([place])    skin cancer   Cardiac murmur 07/25/2021   Carotid stenosis    a. Carotid U/S 3/54: LICA < 65%, RICA 68-12%;  b.  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   Cystoid  macular edema of both eyes 07/28/2020   Degenerative disc disease, lumbar 06/30/2015   Deviated nasal septum 02/18/2015   Diverticulosis 03/03/2020   DJD (degenerative joint disease) of hip 12/24/2012   Dyspnea 06/12/2011   Emphysema, unspecified ([place]) 03/05/2020   Erectile dysfunction 06/20/2017   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[place]) 02/09/2021   Gait disorder 03/02/2021   GERD (gastroesophageal reflux disease)    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   Qualifier: Diagnosis of  By: Burnett Kanaris     Greater trochanteric pain syndrome 07/22/2021   Hand pain, left 01/16/2022   Hearing loss 02/02/2016   Heme positive stool 02/29/2020   HIATAL HERNIA 05/13/2009   Qualifier: Diagnosis of  By: Burnett Kanaris     High risk medication use 01/18/2012   Hip pain 03/02/2021   History of chicken pox    History of COVID-19 12/05/2021   History of hemorrhoids    History of kidney stones    History of skin cancer 07/22/2021   skin cancer   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 Cardiology - Dr Frances Nickels at [place]  cardiology  ICD-10 cut over   Hypertension    under control   Hypokalemia 07/08/2021   Hypothyroidism    Internal hemorrhoids 03/03/2020   Leg cramps 11/13/2019   Lower GI bleed 12/12/2012   Lumbago of lumbar region with sciatica 10/21/2020   Lumbar radiculopathy 03/02/2021   Lumbar spondylosis 11/02/2020   NEPHROLITHIASIS 05/13/2009   Qualifier: Diagnosis of  By: Burnett Kanaris     Nonspecific abnormal electrocardiogram (ECG) (EKG) 01/06/2013   Numbness of hand 01/24/2022   Onychomycosis 06/30/2015   Osteoarthrosis, 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 Osteoarthritis Of The Hand  10/1 IMO update   Osteoarthrosis,  unspecified whether generalized or localized, pelvic region and thigh 07/25/2010   Formatting of this note might be different from the original. Osteoarthritis Of The Hip   Other abnormal glucose 07/22/2021   Other allergic rhinitis 02/18/2015   Palpitations 07/25/2021   Peripheral neuropathy 06/19/2016   Preventative health care 11/24/2015   Right groin pain 11/29/2012   Right inguinal hernia 05/13/2009   Qualifier: Diagnosis of  By: Burnett Kanaris     RLS (restless legs syndrome) 02/18/2015   Rosacea    Rotator cuff tear 07/27/2013   Situational depression 03/02/2021   Skin lesion 01/16/2022   Sleep apnea with use of continuous positive airway pressure (CPAP) 07/15/2013   CPAP set to  7 cm water,  Residual AHi was 7.8  With no leak.  User time 6 hours.  479 days , not used only 12 days - highly compliant 06-23-13  .    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   Urinary retention 11/26/2020   Ventral hernia 07/08/2012   Vertigo 07/08/2021   Weight loss 03/03/2020   Past Surgical History:  Procedure Laterality Date   ABDOMINAL HERNIA REPAIR  07/15/12   Dr [name]   ANTERIOR CERVICAL DECOMP/DISCECTOMY FUSION N/A 07/09/2020   Procedure: ANTERIOR CERVICAL DECOMPRESSION AND FUSION CERVICAL THREE-FOUR.;  Surgeon: [name], MD;  Location: [place];  Service: Neurosurgery;  Laterality: N/A;  anterior   BROW LIFT  05/07/01   COLONOSCOPY WITH PROPOFOL N/A 04/24/2022   Procedure: COLONOSCOPY WITH PROPOFOL;  Surgeon: Thornton Park, MD;  Location: [place];  Service: Gastroenterology;  Laterality: N/A;   COLONOSCOPY WITH PROPOFOL N/A 04/26/2022   Procedure:  COLONOSCOPY WITH PROPOFOL;  Surgeon: Thornton Park, MD;  Location: [place];  Service: Gastroenterology;  Laterality: N/A;   CYSTOSCOPY WITH URETEROSCOPY AND STENT PLACEMENT Right 01/28/2014   Procedure: CYSTOSCOPY WITH URETEROSCOPY, BASKET RETRIVAL AND  STENT PLACEMENT;  Surgeon: [name], MD;  Location: [facility];  Service: Urology;  Laterality: Right;   epidural injections     multiple procedures   ESOPHAGEAL DILATION  1993 and 1994   multiple times   EYE SURGERY Bilateral 03/25/10, 2012   cataract removal   HEMORRHOID SURGERY  2014   HIATAL HERNIA REPAIR  12/21/93   HOLMIUM LASER APPLICATION Right 4/81/8563   Procedure: HOLMIUM LASER APPLICATION;  Surgeon: [name], MD;  Location: [facility];  Service: Urology;  Laterality: Right;   INGUINAL HERNIA REPAIR Right 07/15/12   Dr [name], x2   JOINT  REPLACEMENT  07/25/04   right knee   JOINT REPLACEMENT  07/13/10   left knee   KNEE SURGERY  1995   LAPAROSCOPIC CHOLECYSTECTOMY  09/20/06   with intraoperative cholangiogram and right inguinal herniorrhaphy with mesh    LIGAMENT REPAIR Left 07/2013   shoulder   LITHOTRIPSY     x2   NASAL SEPTUM SURGERY  1975   POLYPECTOMY  04/26/2022   Procedure: POLYPECTOMY;  Surgeon: Thornton Park, MD;  Location: [place];  Service: Gastroenterology;;   POSTERIOR CERVICAL FUSION/FORAMINOTOMY N/A 11/24/2020   Procedure: Posterior Cervical Laminectomy Cervical three-four, Cervical four-five with lateral mass fusion;  Surgeon: [name], MD;  Location: [place];  Service: Neurosurgery;  Laterality: N/A;   REFRACTIVE SURGERY Bilateral    Right total hip replacement  4/14   SKIN CANCER DESTRUCTION     nose, ear   TONSILLECTOMY  as child   TOTAL HIP ARTHROPLASTY Left    URETHRAL DILATION  1991   [name]   Patient Active Problem List   Diagnosis Date Noted   Hand weakness 05/02/2022   Acute kidney injury ([place]) 05/02/2022   Adenomatous polyp of ascending colon    Adenomatous polyp of  colon    Acute metabolic encephalopathy 16/60/6301   Aspiration pneumonia ([place]) 04/24/2022   GI bleed 04/21/2022   Vasovagal syncope    Hemorrhagic shock (HCC)    Symptomatic anemia    Hematochezia    Acute blood loss anemia    Irregular heart beat 02/03/2022   Constipation 02/03/2022   Carpal tunnel syndrome 01/24/2022   Numbness of hand 01/24/2022   Ulnar neuropathy 01/24/2022   Skin lesion 01/16/2022   History of COVID-19 12/05/2021   Benign paroxysmal positional vertigo 09/14/2021   Palpitations 07/25/2021   Cardiac murmur 07/25/2021   History of chicken pox 07/22/2021   History of hemorrhoids 07/22/2021   Hypertension 07/22/2021   COPD (chronic obstructive pulmonary disease) ([place]) 07/22/2021   History of kidney stones 07/22/2021   History of skin cancer 60/06/9322   Complication of anesthesia 07/22/2021   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[place]) 07/22/2021   Other abnormal glucose 07/22/2021   Spondylitis ([place]) 07/22/2021   Greater trochanteric pain syndrome 07/22/2021   Vertigo 07/08/2021   Hypokalemia 07/08/2021   Trochanteric bursitis of left hip 06/30/2021   Cancer ([place]) 04/11/2021   Lumbar radiculopathy 03/02/2021   Gait instability 03/02/2021   Hip pain 03/02/2021   Situational depression 03/02/2021   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[place]) 02/09/2021   Urinary retention 11/26/2020   Cervical myelopathy ([place]) 11/24/2020   Body mass index (BMI) 25.0-25.9, adult 11/02/2020   Lumbar spondylosis 11/02/2020   Lumbago of lumbar region with sciatica 10/21/2020   Cystoid macular edema of both eyes 07/28/2020   Status post cervical spinal fusion 07/09/2020   History of total bilateral knee replacement 07/08/2020   Aortic atherosclerosis ([place]) 03/05/2020   Emphysema, unspecified ([place]) 03/05/2020   Arthritis 03/04/2020   Diverticulosis 03/03/2020   Internal hemorrhoids 03/03/2020   Weight loss 03/03/2020   Heme  positive stool 02/29/2020   Leg cramps 11/13/2019   Tibialis anterior tendon tear, nontraumatic 11/13/2019   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 07/29/2018   CPAP (continuous positive airway pressure) dependence 03/25/2018   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   Erectile dysfunction 06/20/2017   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 03/06/2017   Peripheral neuropathy 06/19/2016   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  Hearing loss 02/02/2016   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   Allergic rhinitis 02/02/2016   Atypical chest pain 11/24/2015   Preventative health care 11/24/2015   Onychomycosis 06/30/2015   Degenerative disc disease, lumbar 06/30/2015   Other allergic rhinitis 02/18/2015   Deviated nasal septum 02/18/2015   RLS (restless legs syndrome) 02/18/2015   GERD (gastroesophageal reflux disease) 09/18/2014   Hyperglycemia 03/03/2014   Rotator cuff tear 07/27/2013   Sleep apnea with use of continuous positive airway pressure (CPAP) 07/15/2013   Carotid stenosis 02/24/2013   Nonspecific abnormal electrocardiogram (ECG) (EKG) 01/06/2013   DJD (degenerative joint disease) of hip 12/24/2012   Lower GI bleed 12/12/2012   Right groin pain 11/29/2012   OSA (obstructive sleep apnea) 11/25/2012   Ventral hernia 07/08/2012   High risk medication use 01/18/2012   Dyspnea 06/12/2011   Hyperlipidemia with target LDL less than 130 12/27/2010   Osteoarthrosis, unspecified whether generalized or localized, pelvic region and thigh 07/25/2010   Osteoarthrosis, hand 06/13/2010   Anemia 12/17/2009   Hypothyroidism 05/13/2009   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   Benign essential hypertension 05/13/2009   Right inguinal hernia 05/13/2009   HIATAL HERNIA 05/13/2009   NEPHROLITHIASIS 05/13/2009   ROSACEA 05/13/2009    PCP: [name], NP  REFERRING PROVIDER: [name], NP  REFERRING DIAG: R26.81 (ICD-10-CM) - Gait instability    THERAPY DIAG:  Unsteadiness on feet  Other abnormalities of gait and mobility  Muscle weakness (generalized)  Repeated falls  Other low back pain  RATIONALE FOR EVALUATION AND TREATMENT:  Rehabilitation  ONSET DATE:  Fall 01/12/22 (2 falls in past 6 months)  SUBJECTIVE:   SUBJECTIVE STATEMENT: Pt reports continued weakness, more on R than L, but no pain.  PAIN:  Are you having pain? No  PERTINENT HISTORY:  Aortic atherosclerosis; carotid stenosis; cervical myelopathy s/p ACDF 06/2020 and posterior cervical fusion 11/2020; peripheral neuropathy; lumbar DDD/spondylosis; lumbago with sciatica/lumbar radiculopathy; OA; B TKA; BTHA; L greater trochanteric pain syndrome; L RCR 2014; recent CTR surgery - early May; hearing loss; HTN; BPPV/vertigo; B sacroiliitis; skin cancer; COPD/emphysema; sleep apnea with CPAP; GERD; gout; HLD; LE cramps; RLS  PRECAUTIONS: None  WEIGHT BEARING RESTRICTIONS: No  FALLS: Has patient fallen in last 6 months? Yes. 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 No acute intracranial abnormality.  2. No acute fracture or traumatic subluxation of the cervical spine. L wrist x-ray: 1. Mild dorsal wrist soft tissue swelling with small ossific  density overlying the proximal carpal row. Correlate for triquetral fracture.  2. Moderate scaphotrapeziotrapezoid and mild first CMC joint osteoarthritis. L elbow x-ray: 1. No acute fracture or dislocation, left elbow.  2. Mild elevation of the anterior fat pad suggestive of a small joint effusion, which may be reactive  in the setting of elbow osteoarthritis. L shoulder x-rays: No acute osseous abnormality.  COGNITION: Overall cognitive status: Within functional limits for tasks assessed and Impaired: Memory: Impaired: Short term Procedural   SENSATION: Peripheral neuropathy in B feet  COORDINATION: Mildly impaired with slow movements  EDEMA:  Mild to moderate edema in distal B LE  MUSCLE LENGTH: Hamstrings: mod tight R>L ITB: mod tight B Piriformis: mod tight L>R Hip flexors: mod tight B Quads: mod tight B Heelcord: mod tight B  POSTURE:  rounded shoulders, forward head, and flexed trunk   LOWER EXTREMITY MMT:   Tested in sitting on eval 02/22/22, 03/22/22, 04/17/22 & 05/04/22 MMT Right 02/22/22 Left 02/22/22 Right 03/22/22 Left 03/22/22 Right 04/17/22 Left  04/17/22 Right  05/04/22 Left  05/04/22 Right  06/06/22 Left  06/06/22  Hip flexion 3+ 4- 3+ 4- 4- 4- 3+ 3+ 4- 4  Hip extension 3- 3- 3+ 3+ 3+ 3+ 3 3 3+ 4-  Hip abduction 4- 4 4 4+ 4 4+ 4- 4 4- 4  Hip adduction _0 4+ 4+ 4+ _1 4+  Hip internal rotation 3 3+ 3 3+ 3 3+ 3- 3 3+ 4-  Hip external rotation 3+ 3+ 3+ 3+ 3+ 3+ 3 3+ 3+ 4-  Knee flexion 4- 4- 4 4 4+ 4+ 4- 4- 4 4+  Knee extension 4- 4 4 4+ 4+ 4+ 4 4 4+ 4+  Ankle dorsiflexion 4- 4 4- 4 4 4- 4- 4- 4- 4  Ankle plantarflexion 3+  6 SLS HR 4 10 SLS HR 4+ 20 SLS HR Limited lift on last few reps 5 20 SLS HR 5- 18 SLS HR 5 20 SLS HR 4- 7 SLS HR Pt noting increased proximal LE pain 4+ 15 SLS HR 5- 20 SLS HR Limited lift on last few reps 5 20 SLS HR  Ankle inversion 3+ 4- 3+ 4- 4- 4- 4- 4- 4- 4  Ankle eversion 3+ 4- 3+ 4- 4- 4- 3+ 3+ 4- 4  (Blank rows = not  tested)  BED MOBILITY:  Sit to supine SBA Supine to sit SBA  TRANSFERS: Assistive device utilized: Single point cane and None  Sit to stand: Modified independence Stand to sit: Modified independence Chair to chair: Modified independence Floor:  NT  GAIT: Gait pattern: step through pattern, decreased stride length, decreased hip/knee flexion- Right, decreased hip/knee flexion- Left, decreased trunk rotation, and wide BOS Distance walked: 80 Assistive device utilized: Single point cane Level of assistance: SBA Comments: Gait speed: 2.10 ft/sec with SPC, 2.31 ft/sec w/o AD  RAMP: Level of Assistance:  NT Assistive device utilized:    Ramp Comments:   CURB:  Level of Assistance:  NT Assistive device utilized:    Curb Comments:   STAIRS:  Level of Assistance:  NT  Stair Negotiation Technique:   Number of Stairs:    Height of Stairs:   Comments:   FUNCTIONAL TESTS:  5 times sit to stand: 13.0 sec Timed up and go (TUG): 13.53 sec with SPC; 14.19 sec w/o AD 10 meter walk test: 15.60 sec with SPC; 13.04 sec w/o AD Berg Balance Scale: 39/56 - scores 37-45 indicate significant risk for falls (>80%) Dynamic Gait Index: 11/24; Scores of 19 or less are predictive of falls in older community living adults  PATIENT SURVEYS:  ABC scale 55% of self confidence; 45% impairment  TODAY'S TREATMENT:   06/06/22 THERAPEUTIC ACTIVITIES: MMT 5x STS: 15.40 sec TUG: 12.66 sec with SPC; 11.87 sec  w/o AD  10MWT: 12.12 sec with SPC; 10.37 sec w/o AD (more unsteady) Gait speed: 2.71 ft/sec with SPC; 3.16 ft/sec w/o AD Berg Balance Scale: 43/56 - scores 37-45 = significant fall risk  (>80%)  DGI: 14/24; Scores of 19 or less are predictive of falls in older community living adults   05/29/22 THERAPEUTIC EXERCISE: to improve flexibility, strength and mobility.  Verbal and tactile cues throughout for technique. NuStep - L6 x 6 min (UE/LE) Standing B heel-toe raises x 20, UE support on counter  for all standing exercises Counter squats 2 x 10 Standing R/L hip extension with looped RTB at ankles 2 x 10 Standing R/L hip abduction with looped RTB at ankles 2 x 10 Standing R/L hip flexion march with looped RTB at midfeet 2 x 10 Seated RTB alt unilateral hip ABD/ER clam 2 x 10  NEUROMUSCULAR RE-EDUCATION: To improve balance, coordination, and reduce fall risk. B Side stepping along counter with hands hovering 4 x 10 ft Tandem stance 2 x 30 sec - intermittent UE support on counter Tandem gait along counter 2 x 10 ft fwd, 1 x 10 ft backwards Alt toe clears to 9" step with intermittent UE support on counter x 20 Reciprocal fwd step-over 3 pairs of balance pebbles x 10, intermittent UE support on counter Lateral step-over 3 pairs of balance pebbles x 10, UE support on counter   05/25/22 TherEx: Nustep L6x5min Seated ball squeeze x 15 Seated HS curl red TB 2x10 Sidestep 2x15 ft no UE support - close supervision Step ups on 6' step 2 x 10 bil Sit to stand x 10    PATIENT EDUCATION: Education details: progress with PT and ongoing PT POC Person educated: Patient Education method: Explanation Education comprehension: verbalized understanding   HOME EXERCISE PROGRAM: Access Code: [email or web address] URL: [email or web address] Date: 03/22/2022 Prepared by: Annie Paras  Exercises - Standing Gastroc Stretch at Counter  - 1-2 x daily - 7 x weekly - 2-3 reps - 30 sec hold - Standing Soleus Stretch at Counter  - 1-2 x daily - 7 x weekly - 2-3 reps - 30 sec hold - Hip Flexor Stretch on Step  - 1-2 x daily - 7 x weekly - 2-3 reps - 30 sec hold - Side Stepping with Resistance at Ankles and Counter Support  - 1 x daily - 7 x weekly - 2 sets - 10 reps - Forward and Backward Step Over with Counter Support  - 1 x daily - 7 x weekly - 2 sets - 10 reps - Turning in Corner 360  - 1 x daily - 7 x weekly - 2 sets - 5 reps - Tandem Stance in Corner  - 1 x daily - 7 x weekly - 2 reps - 30 sec  hold - Single Leg Stance with Support  - 1 x daily - 7 x weekly - 2 sets - 30 sec hold - Squat with Chair Touch  - 1 x daily - 7 x weekly - 2 sets - 10 reps - 3 sec hold - Seated Hamstring Stretch  - 1-2 x daily - 7 x weekly - 2 sets - 30 sec hold - Seated Hip Adductor Stretch  - 1-2 x daily - 7 x weekly - 3 reps - 30 sec hold - Seated Figure 4 Piriformis Stretch  - 1-2 x daily - 7 x weekly - 2 sets - 30 sec hold - Supine Piriformis Stretch with Foot on Ground  - 1-2 x daily -  7 x weekly - 2-3 reps - 30 sec hold - Supine Lower Trunk Rotation  - 1 x daily - 7 x weekly - 2 sets - 30 sec hold - Bent Knee Fallouts with Alternating Legs  - 1 x daily - 7 x weekly - 2 sets - 10 reps - 3 sec hold - Seated Hip Abduction with Resistance  - 1 x daily - 3-4 x weekly - 2 sets - 10 reps - 3-5 sec hold - Seated Single Leg Hip Abduction with Resistance  - 1 x daily - 3-4 x weekly - 2 sets - 10 reps - 3 sec hold - Seated March with Resistance  - 1 x daily - 3-4 x weekly - 2 sets - 10 reps - 3 sec hold - Seated Shoulder Row with Anchored Resistance  - 1 x daily - 3-4 x weekly - 2 sets - 10 reps - 3-5 hold hold  Patient Education - Check for Safety   ASSESSMENT:  CLINICAL IMPRESSION: Matvey "[name]" reports he typically feels better in the mornings but notes more fatigue as the day progresses, with R sided weakness still more prominent than L. He notes occasional catching of his R toes when walking causing him to stumble but has not had any falls. Strength and balance testing revealing gains today but still not back to levels prior to hospitalization due to GI bleed, with standardized balance testing still revealing significant to high fall risk on most tests and unsteadiness observed with mobility and gait within clinic. As such, reinforced need for continued use of SPC with all mobility as pt states he sometimes walk off w/o the cane. [name] will continue to benefit from skilled PT to improve core/LE strength and  balance to improve his activity tolerance and reduce his risk for future falls.  OBJECTIVE IMPAIRMENTS Abnormal gait, decreased activity tolerance, decreased balance, decreased endurance, decreased knowledge of condition, decreased knowledge of use of DME, decreased mobility, difficulty walking, decreased ROM, decreased strength, decreased safety awareness, increased fascial restrictions, impaired perceived functional ability, increased muscle spasms, impaired flexibility, improper body mechanics, postural dysfunction, and pain.   ACTIVITY LIMITATIONS carrying, lifting, bending, standing, squatting, stairs, transfers, and locomotion level  PARTICIPATION LIMITATIONS: cleaning, laundry, interpersonal relationship, community activity, and yard work  PERSONAL FACTORS Age, Fitness, Past/current experiences, Time since onset of injury/illness/exacerbation, and 3+ comorbidities: Aortic atherosclerosis; carotid stenosis; cervical myelopathy s/p ACDF 06/2020 and posterior cervical fusion 11/2020; peripheral neuropathy; lumbar DDD/spondylosis; lumbago with sciatica/lumbar radiculopathy; OA; B TKA; BTHA; L greater trochanteric pain syndrome; L RCR 2014; recent CTR surgery - early May; hearing loss; HTN; BPPV/vertigo; B sacroiliitis; skin cancer; COPD/emphysema; sleep apnea with CPAP; GERD; gout; HLD; LE cramps; RLS  are also affecting patient's functional outcome.   REHAB POTENTIAL: Good  CLINICAL DECISION MAKING: Evolving/moderate complexity  EVALUATION COMPLEXITY: Moderate  GOALS: Goals reviewed with patient? Yes  SHORT TERM GOALS: Target date: 03/22/2022, extended to 06/01/22   Patient will be independent with initial HEP. Baseline:  Goal status: MET  03/22/22  2.  Patient will demonstrate decreased fall risk by scoring > 45/56 on Berg. Baseline: 39/56 (eval - 02/22/22); 43/56 (03/22/22); 48/56 (04/17/22); decreased to 40/56 (05/04/22) following hospitalization; 43/56 (06/06/22) Goal status: IN PROGRESS   06/06/22 - Increased to 43/56  3.  Patient will be educated on strategies to decrease risk of falls.  Baseline:  Goal status: MET  03/22/22 - Education provided last visit - Pt reports no concerns as he went through the home checklist  other than the bathroom mat non-skid backing has disintegrated from repeated washing (he states he pushes it to the side while he is in the BR)   LONG TERM GOALS: Target date: 04/19/2022, extended to 05/29/2022, then 06/29/22   Patient will be independent with advanced/ongoing HEP to improve outcomes and carryover.  Baseline:  Goal status: IN PROGRESS  05/29/22 - Pt instructed to rotate through HEP exercise in smaller groups to reduce fatigue and allow for recovery time.  2.  Patient will be able to ambulate 600' with LRAD with good safety to access community.  Baseline:  Goal status: IN PROGRESS  3.  Patient will be able to step up/down curb safely with LRAD for safety with community ambulation.  Baseline:  Goal status: MET  04/17/22  4.  Patient will demonstrate improved functional LE strength as demonstrated by overall MMT >/= 4 to 4+/5. Baseline:  Goal status: IN PROGRESS  06/06/22 - Slowly improving but R weaker than L (refer to MMT chart)  5.  Patient will demonstrate at least 19/24 on DGI to improve gait stability and reduce risk for falls. Baseline: 11/24 (eval); 16/24 (03/31/22); 18/24 (04/17/22); 12/24 (05/04/22); 14/24 (06/06/22) Goal status: IN PROGRESS  9/26//23 - Increased to 14/24   6.  Patient will score >/= 52/56 on Berg Balance test to demonstrate lower risk of falls. (MCID= 8 points) .  Baseline: 39/56 (eval - 02/22/22); 43/56 (03/22/22); 48/56 (04/17/22); 40/56 (05/04/22); 43/56 (06/06/22) Goal status: IN PROGRESS  06/06/22 - Increased to 43/56  7.  Patient will report >67% of self confidence on ABC scale to demonstrate improved functional ability. Baseline: 55% self confidence (eval); 53.8 % (04/17/22) Goal status: IN PROGRESS  8.  Patient will  demonstrate gait speed of >/= 2.62 ft/sec (0.80 m/s) to be a safe community ambulator with decreased risk for recurrent falls.  Baseline: 2.10 ft/sec with SPC, 2.31 ft/sec w/o AD (eval); 2.90 ft/sec with SPC; 2.92 ft/sec w/o AD (03/31/22); 2.99 ft/sec with SPC; 3.14 ft/sec w/o AD (04/17/22); 2.77 ft/sec with SPC; 3.09 ft/sec w/o AD but much more unsteady (05/04/22);  2.71 ft/sec with SPC; 3.16 ft/sec w/o AD (06/06/22) Goal status: MET  03/31/22   PLAN: PT FREQUENCY: 2x/week  PT DURATION: 7-8 weeks  PLANNED INTERVENTIONS: Therapeutic exercises, Therapeutic activity, Neuromuscular re-education, Balance training, Gait training, Patient/Family education, Joint mobilization, Stair training, Vestibular training, Canalith repositioning, Dry Needling, Electrical stimulation, Spinal manipulation, Cryotherapy, Moist heat, Taping, Ultrasound, Ionotophoresis 45m/ml Dexamethasone, Manual therapy, and Re-evaluation  PLAN FOR NEXT SESSION:  progress lumbopelvic/LE flexibility and strengthening; balance training with close guarding; HEP review/update/modification as needed   JPercival Spanish PT 06/06/2022, 12:21 PM

## 2022-06-06 NOTE — Procedures (Signed)
[facility]  Titration to Positive Airway Pressure    STUDY DATE: 05/28/2022      PATIENT NAME:  [name] Pratt         DATE OF BIRTH:  [birth year]  PATIENT ID:  [id number]    TYPE of Study: PAP Retitration  READING PHYSICIAN: Larey Seat, MD Referred by: Raynelle Dick, NP  SCORING TECHNICIAN:  Sheena Fields, RPSGT. ACQ System 1   HISTORY: 04-20-2022 [name] Pratt is an established 81 year-old Male sleep apnea patient who has received his last CPAP machine in 2020 and was initially very happy with it. AHI was 3.8/h. This changed in 2023 after COVID infection and after the patient had undergone 2 neck surgeries, which made use of the CPAP more difficult for him. He contracted COVID in early 2023, has CAD, COPD, hypothyroidism, sacroiliitis, anemia, carotid stenosis. Since 2012 on PAP therapy. The Epworth Sleepiness Scale was endorsed at 9 out of 24 points (scores above or equal to 10 are suggestive of hypersomnolence). FSS endorsed at 34/63 points.   RPSGT / sleep technologist was in attendance for the duration of the recording.  Data collection, scoring, video monitoring, and reporting were performed in compliance with the AASM Manual for the Scoring of Sleep and Associated Events; (Hypopnea is scored based on the criteria listed in Section VIII D. 1b in the AASM Manual V2.6 using a 4% oxygen desaturation rule or Hypopnea is scored based on the criteria listed in Section VIII D. 1a in the AASM Manual V2.6 using 3% oxygen desaturation and /or arousal rule).  A physician certified by the American Board of Sleep Medicine reviewed each epoch of the study.  ADDITIONAL INFORMATION:  Height: 66.0 Weight: 153  (BMI 351) Neck Size: 0.0     MEDICATIONS: Tylenol, Ventolin, Zyloprim, Aspirin, Vitamin D, Restasis, Voltaren Gel, Lidex, Neurontin, Gemtesa, Theraworx Relief, Apresoline, Synthroid, Cozaar, Magnesium, Antivert, Theragran, Nitrostat, Zofran ODT,  Miralax, Crestor, Ocean, Ultram, Norvasc   SLEEP CONTINUITY AND SLEEP ARCHITECTURE:  Lights off was at 20:43: and lights on 05:00: (8.3 hours in bed). CPAP titration started at 8 cm water and titrated to 15 cm water , 3 cm EPR, with home mask being used (medium sized [employer] touch ). AHI decreased from AHI 34/h and reaching AHI of 0.0/h at the last explored setting of 15 cm with 3 cm H20 EPR.   Total sleep time was 356.0 minutes (100.0% in supine and 11.0% REM sleep), with a decreased sleep efficiency at 71.6%.  Sleep latency was normal at 18.0 minutes.  Of the total sleep time, the percentage of stage N1 sleep was 6.9%, stage N2 sleep was 52.1%, stage N3 sleep was 30.1%, and REM sleep was 11.0%. Wake after sleep onset (WASO) time accounted for 123 minutes.  There were 3 Stage R periods observed on this study night, 38 awakenings (i.e. transitions to Stage W from any sleep stage), and 107.0 total stage transitions.  BODY POSITION: Duration of total sleep and percent of total sleep in their respective position is as follows: supine 356 minutes (100.0%), Total supine REM sleep time was 39 minutes (100.0% of total REM sleep).  RESPIRATORY MONITORING:  Based on CMS criteria (using a 4% oxygen desaturation rule for scoring hypopneas), there were 22 apneas (14 obstructive; 5 central; 3 mixed), and 20 hypopneas.  Apnea index was 3.7. Hypopnea index was 3.4. The apnea-hypopnea index was 7.1 overall (7.1 supine, 0.0 non-supine; 0.0 REM, 0.0 supine REM). There were 0  respiratory effort-related arousals (RERAs). There were 0 occurrences of Cheyne Stokes breathing. OXIMETRY: Total sleep time spent below 89% was 0.2 minutes, or 0.1% of total sleep time.  Oxyhemoglobin desaturations during sleep reached a nadir of 88%, reduced from a mean oxygen saturation of 97%.   LIMB MOVEMENTS: There were 393 periodic limb movements of sleep (66.2/h), of which 0 (0.0/h) were associated with an arousal. EKG: Regular,  bradycardia. The average heart rate during sleep was 47 bpm.  The  heart rate during sleep varied between 42 and 63 bpm. AROUSALS: There were 64 arousals in total.  Of these, 19 were identified as respiratory-related arousals (3.2 /h), 0 were PLM-related arousals (0.0 /h), and 45 were non-specific arousals (7.6 /h)    IMPRESSION:   1.  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  There was no baseline apnea evaluation , titration began at 8 cm water with documented AHI of 34/h with central apneas, the highest AHI of 63/h was seen under 11 cm water CPAP. Complete resolution of AHI under 15 cm water, 3 cm EPR.   No hypoxemia of clinical significance was associated.  2. Total sleep time was within normal limits.  The quality of sleep did change with positive airway pressure.    3. Frequent periodic limb movements (PLMs) of sleep were observed but not related to arousals.      RECOMMENDATIONS: The current CPAP machine can be reset to cover the optimal pressure of 15 cmH2O with 3 cmH2O EPR , using the established FFM model from home ( medium ResMed airtouch FFM ) .   RECOMMENDATIONS Set the current auto CPAP to 15cm water, 3 cm EPR with a ramp beginning at 6 cm water.    Larey Seat,  MD   cc: [name] Givens, NP

## 2022-06-07 ENCOUNTER — Ambulatory Visit (INDEPENDENT_AMBULATORY_CARE_PROVIDER_SITE_OTHER): Payer: Medicare HMO | Admitting: Family

## 2022-06-07 VITALS — BP 153/59 | HR 63 | Temp 97.6°F | Resp 16 | Wt 155.0 lb

## 2022-06-07 DIAGNOSIS — D649 Anemia, unspecified: Secondary | ICD-10-CM | POA: Diagnosis not present

## 2022-06-07 DIAGNOSIS — E039 Hypothyroidism, unspecified: Secondary | ICD-10-CM | POA: Diagnosis not present

## 2022-06-07 DIAGNOSIS — I10 Essential (primary) hypertension: Secondary | ICD-10-CM

## 2022-06-07 DIAGNOSIS — Z23 Encounter for immunization: Secondary | ICD-10-CM

## 2022-06-07 DIAGNOSIS — J449 Chronic obstructive pulmonary disease, unspecified: Secondary | ICD-10-CM

## 2022-06-07 DIAGNOSIS — M1A0720 Idiopathic chronic gout, left ankle and foot, without tophus (tophi): Secondary | ICD-10-CM | POA: Diagnosis not present

## 2022-06-07 LAB — CBC WITH DIFFERENTIAL/PLATELET
Basophils Absolute: 0.1 10*3/uL (ref 0.0–0.1)
Basophils Relative: 1.1 % (ref 0.0–3.0)
Eosinophils Absolute: 0.2 10*3/uL (ref 0.0–0.7)
Eosinophils Relative: 3.4 % (ref 0.0–5.0)
HCT: 36.8 % — ABNORMAL LOW (ref 39.0–52.0)
Hemoglobin: 12.3 g/dL — ABNORMAL LOW (ref 13.0–17.0)
Lymphocytes Relative: 22.5 % (ref 12.0–46.0)
Lymphs Abs: 1.3 10*3/uL (ref 0.7–4.0)
MCHC: 33.4 g/dL (ref 30.0–36.0)
MCV: 94.5 fl (ref 78.0–100.0)
Monocytes Absolute: 0.8 10*3/uL (ref 0.1–1.0)
Monocytes Relative: 13.4 % — ABNORMAL HIGH (ref 3.0–12.0)
Neutro Abs: 3.4 10*3/uL (ref 1.4–7.7)
Neutrophils Relative %: 59.6 % (ref 43.0–77.0)
Platelets: 317 10*3/uL (ref 150.0–400.0)
RBC: 3.89 Mil/uL — ABNORMAL LOW (ref 4.22–5.81)
RDW: 16.9 % — ABNORMAL HIGH (ref 11.5–15.5)
WBC: 5.7 10*3/uL (ref 4.0–10.5)

## 2022-06-07 MED ORDER — AMLODIPINE BESYLATE 2.5 MG PO TABS: 2.5000 mg | ORAL_TABLET | Freq: Every day | ORAL | 0 refills | Status: DC

## 2022-06-07 NOTE — Assessment & Plan Note (Signed)
Clinically stable. Will check cbc.

## 2022-06-07 NOTE — Assessment & Plan Note (Signed)
No recent symptoms. Continues allopurinol. Stable.

## 2022-06-07 NOTE — Progress Notes (Signed)
CPAP Orders placed and community message sent to DME: [facility] on file.

## 2022-06-07 NOTE — Assessment & Plan Note (Signed)
continue albuterol as needed.

## 2022-06-07 NOTE — Assessment & Plan Note (Signed)
>>  ASSESSMENT AND PLAN FOR BENIGN ESSENTIAL HYPERTENSION WRITTEN ON 06/07/2022 11:06 AM BY O'SULLIVAN, Jenavieve Freda, NP  BP is a bit elevated off of amlodipine 5mg . Continue losartan, restart amlodipine at 2.5mg .

## 2022-06-07 NOTE — Progress Notes (Signed)
Subjective:   By signing my name below, I, Cleda Mccreedy, attest that this documentation has been prepared under the direction and in the presence of Clark, NP 06/07/2022.     Patient ID: [name] Pratt, male    DOB: 01-Jul-1941, 81 y.o.   MRN: [id number]  Chief Complaint  Patient presents with   Follow-up    Here for 6 weeks follow up   Hypertension    Here for follow up   Hand numbness    HPI Patient is in today for office visit.   Carpel tunnel: He complains of less feeling in both hands, which might be due to carpal tunnel. He is scheduled for occupational therapist on 06/09/2022.   Blood Pressure: He only takes 5 mg amlodipine prn. However, he is regularly taking 100 mg losartan. BP Readings from Last 3 Encounters:  06/07/22 (!) 153/59  05/02/22 (!) 139/56  04/27/22 (!) 151/70    Pulse Readings from Last 3 Encounters:  06/07/22 63  05/02/22 (!) 58  04/27/22 (!) 54    Sleeping/Breathing: He continues to wear cpap, which is helping him breath better.  Immunization: He is interested in receiving the influenza vaccine during today's visit.   Vision: He is UTD for eye exams.    Past Medical History:  Diagnosis Date   Allergic rhinitis 02/02/2016   Anemia 12/17/2009   Formatting of this note might be different from the original. Anemia  10/1 IMO update   Aortic atherosclerosis ([place]) 03/05/2020   Arthritis    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   Atypical chest pain 11/24/2015   Benign essential hypertension 05/13/2009   Qualifier: Diagnosis of  By: Larwance Sachs of this note might be different from the original. Hypertension   Benign paroxysmal positional vertigo 09/14/2021   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[place]) 07/22/2021   Body mass index (BMI) 25.0-25.9, adult 11/02/2020   Cancer ([place])    skin cancer   Cardiac murmur 07/25/2021   Carotid stenosis    a. Carotid U/S 9/76: LICA < 73%, RICA 41-93%;  b.  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   Cystoid macular edema of both eyes 07/28/2020   Degenerative disc disease, lumbar 06/30/2015   Deviated nasal septum 02/18/2015   Diverticulosis 03/03/2020   DJD (degenerative joint disease) of hip 12/24/2012   Dyspnea 06/12/2011   Emphysema, unspecified ([place]) 03/05/2020   Erectile dysfunction 06/20/2017   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[place]) 02/09/2021   Gait disorder 03/02/2021   GERD (gastroesophageal reflux disease)    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   Qualifier: Diagnosis of  By: Burnett Kanaris     Greater trochanteric pain syndrome 07/22/2021   Hand pain, left 01/16/2022   Hearing loss 02/02/2016   Heme positive stool 02/29/2020   HIATAL HERNIA 05/13/2009   Qualifier: Diagnosis of  By: Burnett Kanaris     High risk medication use 01/18/2012   Hip pain 03/02/2021   History of chicken pox    History of COVID-19 12/05/2021   History of hemorrhoids    History of kidney stones  History of skin cancer 07/22/2021   skin cancer   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 Cardiology - Dr Frances Nickels at [facility]ology  ICD-10 cut over   Hypertension    under control   Hypokalemia 07/08/2021   Hypothyroidism    Internal hemorrhoids 03/03/2020   Leg cramps 11/13/2019   Lower GI bleed 12/12/2012   Lumbago of lumbar region with sciatica 10/21/2020   Lumbar radiculopathy 03/02/2021   Lumbar  spondylosis 11/02/2020   NEPHROLITHIASIS 05/13/2009   Qualifier: Diagnosis of  By: Burnett Kanaris     Nonspecific abnormal electrocardiogram (ECG) (EKG) 01/06/2013   Numbness of hand 01/24/2022   Onychomycosis 06/30/2015   Osteoarthrosis, 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 Osteoarthritis Of The Hand  10/1 IMO update   Osteoarthrosis, unspecified whether generalized or localized, pelvic region and thigh 07/25/2010   Formatting of this note might be different from the original. Osteoarthritis Of The Hip   Other abnormal glucose 07/22/2021   Other allergic rhinitis 02/18/2015   Palpitations 07/25/2021   Peripheral neuropathy 06/19/2016   Preventative health care 11/24/2015   Right groin pain 11/29/2012   Right inguinal hernia 05/13/2009   Qualifier: Diagnosis of  By: Burnett Kanaris     RLS (restless legs syndrome) 02/18/2015   Rosacea    Rotator cuff tear 07/27/2013   Situational depression 03/02/2021   Skin lesion 01/16/2022   Sleep apnea with use of continuous positive airway pressure (CPAP) 07/15/2013   CPAP set to  7 cm water,  Residual AHi was 7.8  With no leak.  User time 6 hours.  479 days , not used only 12 days - highly compliant 06-23-13  .    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   Urinary retention 11/26/2020   Ventral hernia 07/08/2012   Vertigo 07/08/2021   Weight loss 03/03/2020    Past Surgical History:  Procedure Laterality Date   ABDOMINAL HERNIA REPAIR  07/15/12   Dr [name]   ANTERIOR CERVICAL DECOMP/DISCECTOMY FUSION N/A 07/09/2020   Procedure: ANTERIOR CERVICAL DECOMPRESSION AND  FUSION CERVICAL THREE-FOUR.;  Surgeon: [name], MD;  Location: [place];  Service: Neurosurgery;  Laterality: N/A;  anterior   BROW LIFT  05/07/01   COLONOSCOPY WITH PROPOFOL N/A 04/24/2022   Procedure: COLONOSCOPY WITH PROPOFOL;  Surgeon: Thornton Park, MD;  Location: [place];  Service: Gastroenterology;  Laterality: N/A;   COLONOSCOPY WITH PROPOFOL N/A 04/26/2022   Procedure: COLONOSCOPY WITH PROPOFOL;  Surgeon: Thornton Park, MD;  Location: [place];  Service: Gastroenterology;  Laterality: N/A;   CYSTOSCOPY WITH URETEROSCOPY AND STENT PLACEMENT Right 01/28/2014   Procedure: CYSTOSCOPY WITH URETEROSCOPY, BASKET RETRIVAL AND  STENT PLACEMENT;  Surgeon: [name], MD;  Location: [facility];  Service: Urology;  Laterality: Right;   epidural injections     multiple procedures   ESOPHAGEAL DILATION  1993 and 1994   multiple times   EYE SURGERY Bilateral 03/25/10, 2012   cataract removal   HEMORRHOID SURGERY  2014  HIATAL HERNIA REPAIR  12/21/93   HOLMIUM LASER APPLICATION Right 6/76/1950   Procedure: HOLMIUM LASER APPLICATION;  Surgeon: [name], MD;  Location: [facility];  Service: Urology;  Laterality: Right;   INGUINAL HERNIA REPAIR Right 07/15/12   Dr [name], x2   JOINT REPLACEMENT  07/25/04   right knee   JOINT REPLACEMENT  07/13/10   left knee   [place]  09/20/06   with intraoperative cholangiogram and right inguinal herniorrhaphy with mesh    LIGAMENT REPAIR Left 07/2013   shoulder   LITHOTRIPSY     x2   [place]   POLYPECTOMY  04/26/2022   Procedure: POLYPECTOMY;  Surgeon: Thornton Park, MD;  Location: [place];  Service: Gastroenterology;;   POSTERIOR CERVICAL FUSION/FORAMINOTOMY N/A 11/24/2020   Procedure: Posterior Cervical Laminectomy Cervical three-four, Cervical four-five with lateral mass fusion;  Surgeon: [name], MD;  Location: [place];  Service: Neurosurgery;  Laterality: N/A;    REFRACTIVE SURGERY Bilateral    Right total hip replacement  4/14   SKIN CANCER DESTRUCTION     nose, ear   TONSILLECTOMY  as child   TOTAL HIP ARTHROPLASTY Left    URETHRAL DILATION  1991   [name]    Family History  Problem Relation Age of Onset   Cancer Mother    Hypertension Other    Cancer Other    Osteoarthritis Other     Social History   Socioeconomic History   Marital status: Married    Spouse name: Not on file   Number of children: 1   Years of education: Not on file   Highest education level: Not on file  Occupational History   Occupation: firefighter    Comment: paints on the side  Tobacco Use   Smoking status: Former    Years: 11.00    Types: Cigarettes    Quit date: 09/11/1978    Years since quitting: 43.7   Smokeless tobacco: Never  Vaping Use   Vaping Use: Never used  Substance and Sexual Activity   Alcohol use: Not Currently    Alcohol/week: 0.0 standard drinks of alcohol    Comment: rare   Drug use: No   Sexual activity: Not Currently  Other Topics Concern   Not on file  Social History Narrative   Lives with his wife   He has one son- lives locally.   2 grandchildren   Has worked for Research officer, trade union   Enjoys wood working   Completed HS.  [employer]   Social Determinants of Health   Financial Resource Strain: Low Risk  (10/28/2021)   Overall Financial Resource Strain (CARDIA)    Difficulty of Paying Living Expenses: Not very hard  Food Insecurity: No Food Insecurity (06/05/2022)   Hunger Vital Sign    Worried About Running Out of Food in the Last Year: Never true    Ran Out of Food in the Last Year: Never true  Transportation Needs: No Transportation Needs (06/05/2022)   PRAPARE - Hydrologist (Medical): No    Lack of Transportation (Non-Medical): No  Physical Activity: Insufficiently Active (10/28/2021)   Exercise Vital Sign    Days of Exercise per Week: 2 days    Minutes of Exercise per Session: 30 min   Stress: No Stress Concern Present (01/19/2022)   [place]    Feeling of Stress : Only  a little  Social Connections: Moderately Isolated (01/19/2022)   Social Connection and Isolation Panel [NHANES]    Frequency of Communication with Friends and Family: More than three times a week    Frequency of Social Gatherings with Friends and Family: More than three times a week    Attends Religious Services: Never    Marine scientist or Organizations: No    Attends Archivist Meetings: Never    Marital Status: Married  Human resources officer Violence: Not At Risk (01/19/2022)   Humiliation, Afraid, Rape, and Kick questionnaire    Fear of Current or Ex-Partner: No    Emotionally Abused: No    Physically Abused: No    Sexually Abused: No    Outpatient Medications Prior to Visit  Medication Sig Dispense Refill   acetaminophen (TYLENOL) 650 MG CR tablet Take 650-1,300 mg by mouth See admin instructions. Take 1,300 mg by mouth in the morning and 650 mg at bedtime     albuterol (VENTOLIN HFA) 108 (90 Base) MCG/ACT inhaler INHALE 2 PUFFS INTO THE LUNGS EVERY 6 HOURS AS NEEDED FOR SHORTNESS OF BREATH. (Patient taking differently: Inhale 2 puffs into the lungs every 6 (six) hours as needed for shortness of breath.) 1 each 3   allopurinol (ZYLOPRIM) 100 MG tablet TAKE 1 TABLET TWICE DAILY (Patient taking differently: Take 100 mg by mouth in the morning and at bedtime.) 180 tablet 0   antiseptic oral rinse (BIOTENE) LIQD 15 mLs by Mouth Rinse route in the morning.     aspirin EC 81 MG tablet Take 81 mg by mouth at bedtime.     cycloSPORINE (RESTASIS) 0.05 % ophthalmic emulsion Place 1 drop into both eyes 2 (two) times daily.     diclofenac Sodium (VOLTAREN) 1 % GEL Apply 2 g topically daily as needed (for arthritic pain in the hands).     docusate sodium (COLACE) 100 MG capsule Take 1 capsule (100 mg total) by mouth 2 (two) times  daily. 60 capsule 11   fluocinonide (LIDEX) 0.05 % external solution Apply 1 application  topically 2 (two) times daily as needed for itching (SCALP).     GEMTESA 75 MG TABS Take 75 mg by mouth daily.     glucose blood test strip TRUE Metrix Blood Glucose Test Strip, use as instructed 100 each 12   Homeopathic Products (THERAWORX RELIEF) FOAM Apply 1 application topically 2 (two) times daily as needed (Pain).     hydrALAZINE (APRESOLINE) 50 MG tablet Take 1 tablet (50 mg total) by mouth 2 (two) times daily. (Patient taking differently: Take 50 mg by mouth in the morning and at bedtime.) 60 tablet 2   Lancets MISC 1 each by Does not apply route 2 (two) times daily. TRUE matrix lancets 100 each 3   levothyroxine (SYNTHROID) 150 MCG tablet Take 150 mcg 6 days a week (patient takes 75 mcg once a week) Disp #84 for 90 day supply (Patient taking differently: Take 150 mcg by mouth See admin instructions. Take 150 mcg by mouth in the morning before breakfast on Mon/Tues/Wed/Thurs/Fri/Sat) 84 tablet 3   levothyroxine (SYNTHROID) 75 MCG tablet Take 75 mcg once a week (patient on 150 mcg 6 days a week) Dispense #24 for 90 day supply. (Patient taking differently: Take 75 mcg by mouth See admin instructions. Take 75 mcg by mouth in the morning before breakfast on Sundays only) 24 tablet 3   lip balm (CARMEX) ointment Apply 1 Application topically as needed for lip care  or dry skin.     loratadine (CLARITIN) 10 MG tablet Take 10 mg by mouth daily as needed for allergies or rhinitis.     losartan (COZAAR) 100 MG tablet TAKE 1 TABLET EVERY DAY (Patient taking differently: Take 100 mg by mouth daily after supper.) 90 tablet 1   Magnesium 500 MG TABS Take 500 mg by mouth daily.     multivitamin (THERAGRAN) per tablet Take 1 tablet by mouth daily with breakfast.     nitroGLYCERIN (NITROSTAT) 0.4 MG SL tablet Place 1 tablet (0.4 mg total) under the tongue every 5 (five) minutes as needed for chest pain. 25 tablet 6    ondansetron (ZOFRAN-ODT) 4 MG disintegrating tablet Take 1 tablet (4 mg total) by mouth every 8 (eight) hours as needed for nausea or vomiting. (Patient taking differently: Take 4 mg by mouth every 8 (eight) hours as needed for nausea or vomiting (dissolve orally).) 10 tablet 0   polyethylene glycol (MIRALAX / GLYCOLAX) 17 g packet Take 17-34 g by mouth daily as needed for constipation (mix and drink).     PRESCRIPTION MEDICATION CPAP- At bedtime and during any time of rest     rosuvastatin (CRESTOR) 5 MG tablet TAKE 1 TABLET EVERY DAY (Patient taking differently: Take 5 mg by mouth daily.) 90 tablet 1   sodium chloride (OCEAN) 0.65 % nasal spray Place 1 spray into both nostrils at bedtime.     traMADol (ULTRAM) 50 MG tablet Take 1 tablet (50 mg total) by mouth every 12 (twelve) hours as needed. 10 tablet 0   vitamin B-12 (CYANOCOBALAMIN) 500 MCG tablet Take 500 mcg by mouth daily.     No facility-administered medications prior to visit.    Allergies  Allergen Reactions   Pravastatin Other (See Comments)    Muscle cramps   Sildenafil Other (See Comments)    Tachycardia     Oxycodone Other (See Comments)    Hallucinations    Atenolol Other (See Comments)    Bradycardia    Elastic Bandages & [Zinc] Other (See Comments)    Teflon bandages - Irritation   Metoclopramide Hcl Anxiety    ROS See HPI    Objective:    Physical Exam Constitutional:      General: He is not in acute distress.    Appearance: Normal appearance. He is not ill-appearing.  HENT:     Head: Normocephalic and atraumatic.     Right Ear: External ear normal.     Left Ear: External ear normal.  Eyes:     Extraocular Movements: Extraocular movements intact.     Pupils: Pupils are equal, round, and reactive to light.  Cardiovascular:     Rate and Rhythm: Normal rate and regular rhythm.     Heart sounds: Normal heart sounds. No murmur heard.    No gallop.  Pulmonary:     Effort: Pulmonary effort is normal. No  respiratory distress.     Breath sounds: Normal breath sounds. No wheezing or rales.  Skin:    General: Skin is warm and dry.  Neurological:     Mental Status: He is alert and oriented to person, place, and time.  Psychiatric:        Judgment: Judgment normal.     BP (!) 153/59   Pulse 63   Temp 97.6 F (36.4 C) (Oral)   Resp 16   Wt 155 lb (70.3 kg)   SpO2 97%   BMI 25.02 kg/m  Wt Readings from Last 3  Encounters:  06/07/22 155 lb (70.3 kg)  05/02/22 152 lb (68.9 kg)  04/26/22 148 lb 9.4 oz (67.4 kg)       Assessment & Plan:   Problem List Items Addressed This Visit       Unprioritized   Hypothyroidism    Clinically stable on synthroid, check TSH.       GOUT    No recent symptoms. Continues allopurinol. Stable.       COPD (chronic obstructive pulmonary disease) (HCC)    continue albuterol as needed.       Benign essential hypertension    BP is a bit elevated off of amlodipine '5mg'$ . Continue losartan, restart amlodipine at 2.'5mg'$ .       Relevant Medications   amLODipine (NORVASC) 2.5 MG tablet   Anemia - Primary    Clinically stable. Will check cbc.       Relevant Orders   CBC with Differential/Platelet   Other Visit Diagnoses     Needs flu shot       Relevant Orders   Flu Vaccine QUAD High Dose(Fluad) (Completed)        Meds ordered this encounter  Medications   amLODipine (NORVASC) 2.5 MG tablet    Sig: Take 1 tablet (2.5 mg total) by mouth daily.    Dispense:  90 tablet    Refill:  0    Order Specific Question:   Supervising Provider    Answer:   Penni Homans A [[id number]]    I, Nance Pear, NP, personally preformed the services described in this documentation.  All medical record entries made by the scribe were at my direction and in my presence.  I have reviewed the chart and discharge instructions (if applicable) and agree that the record reflects my personal performance and is accurate and complete. 06/07/2022  I,Bracken Moffa S  O'Sullivan,acting as a scribe for Nance Pear, NP.,have documented all relevant documentation on the behalf of Nance Pear, NP,as directed by  Nance Pear, NP while in the presence of Nance Pear, NP.   Nance Pear, NP

## 2022-06-07 NOTE — Assessment & Plan Note (Signed)
BP is a bit elevated off of amlodipine '5mg'$ . Continue losartan, restart amlodipine at 2.'5mg'$ .

## 2022-06-07 NOTE — Patient Instructions (Signed)
Restart amlodipine 2.'5mg'$ .

## 2022-06-07 NOTE — Addendum Note (Signed)
Addended by: Trudie Buckler on: 06/07/2022 08:26 AM   Modules accepted: Orders

## 2022-06-07 NOTE — Assessment & Plan Note (Signed)
Clinically stable on synthroid, check TSH.

## 2022-06-08 ENCOUNTER — Ambulatory Visit: Payer: Medicare HMO | Admitting: Physical Therapy

## 2022-06-08 ENCOUNTER — Telehealth: Payer: Self-pay | Admitting: *Deleted

## 2022-06-08 ENCOUNTER — Encounter: Payer: Self-pay | Admitting: Physical Therapy

## 2022-06-08 DIAGNOSIS — R296 Repeated falls: Secondary | ICD-10-CM | POA: Diagnosis not present

## 2022-06-08 DIAGNOSIS — R2689 Other abnormalities of gait and mobility: Secondary | ICD-10-CM

## 2022-06-08 DIAGNOSIS — M5459 Other low back pain: Secondary | ICD-10-CM

## 2022-06-08 DIAGNOSIS — R2681 Unsteadiness on feet: Secondary | ICD-10-CM | POA: Diagnosis not present

## 2022-06-08 DIAGNOSIS — M6281 Muscle weakness (generalized): Secondary | ICD-10-CM | POA: Diagnosis not present

## 2022-06-08 NOTE — Telephone Encounter (Signed)
CPAP setting change order sent to [facility] on 06/07/22 and Brad w/ [facility] confirmed receipt on 06/08/22.

## 2022-06-08 NOTE — Therapy (Signed)
OUTPATIENT PHYSICAL THERAPY  TREATMENT    Patient Name: [name] Pratt MRN: [id number] DOB:[birth year], 81 y.o., male Today's Date: 06/08/2022      PT End of Session - 06/08/22 1104     Visit Number 20    Number of Visits 27    Date for PT Re-Evaluation 06/29/22    Authorization Type [name]    Authorization Time Period 02/24/22 - 07/18/22    Authorization - Visit Number 47    Authorization - Number of Visits 26    Progress Note Due on Visit 23   Recert completed on visit #13 - 05/04/22   PT Start Time 1104    PT Stop Time 1144    PT Time Calculation (min) 40 min    Activity Tolerance Patient tolerated treatment well    Behavior During Therapy [facility] for tasks assessed/performed                       Past Medical History:  Diagnosis Date   Allergic rhinitis 02/02/2016   Anemia 12/17/2009   Formatting of this note might be different from the original. Anemia  10/1 IMO update   Aortic atherosclerosis ([place]) 03/05/2020   Arthritis    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   Atypical chest pain 11/24/2015   Benign essential hypertension 05/13/2009   Qualifier: Diagnosis of  By: Larwance Sachs of this note might be different from the original. Hypertension   Benign paroxysmal positional vertigo 09/14/2021   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[place]) 07/22/2021   Body mass index (BMI) 25.0-25.9, adult 11/02/2020   Cancer ([place])    skin cancer   Cardiac murmur 07/25/2021   Carotid stenosis    a. Carotid U/S 1/32: LICA < 44%, RICA 01-02%;  b.  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   Cystoid  macular edema of both eyes 07/28/2020   Degenerative disc disease, lumbar 06/30/2015   Deviated nasal septum 02/18/2015   Diverticulosis 03/03/2020   DJD (degenerative joint disease) of hip 12/24/2012   Dyspnea 06/12/2011   Emphysema, unspecified ([place]) 03/05/2020   Erectile dysfunction 06/20/2017   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[place]) 02/09/2021   Gait disorder 03/02/2021   GERD (gastroesophageal reflux disease)    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   Qualifier: Diagnosis of  By: Burnett Kanaris     Greater trochanteric pain syndrome 07/22/2021   Hand pain, left 01/16/2022   Hearing loss 02/02/2016   Heme positive stool 02/29/2020   HIATAL HERNIA 05/13/2009   Qualifier: Diagnosis of  By: Burnett Kanaris     High risk medication use 01/18/2012   Hip pain 03/02/2021   History of chicken pox    History of COVID-19 12/05/2021   History of hemorrhoids    History of kidney stones    History of skin cancer 07/22/2021   skin cancer   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 Cardiology - Dr Frances Nickels at  [facility]  ICD-10 cut over   Hypertension    under control   Hypokalemia 07/08/2021   Hypothyroidism    Internal hemorrhoids 03/03/2020   Leg cramps 11/13/2019   Lower GI bleed 12/12/2012   Lumbago of lumbar region with sciatica 10/21/2020   Lumbar radiculopathy 03/02/2021   Lumbar spondylosis 11/02/2020   NEPHROLITHIASIS 05/13/2009   Qualifier: Diagnosis of  By: Burnett Kanaris     Nonspecific abnormal electrocardiogram (ECG) (EKG) 01/06/2013   Numbness of hand 01/24/2022   Onychomycosis 06/30/2015   Osteoarthrosis, 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 Osteoarthritis Of The Hand  10/1 IMO update   Osteoarthrosis,  unspecified whether generalized or localized, pelvic region and thigh 07/25/2010   Formatting of this note might be different from the original. Osteoarthritis Of The Hip   Other abnormal glucose 07/22/2021   Other allergic rhinitis 02/18/2015   Palpitations 07/25/2021   Peripheral neuropathy 06/19/2016   Preventative health care 11/24/2015   Right groin pain 11/29/2012   Right inguinal hernia 05/13/2009   Qualifier: Diagnosis of  By: Burnett Kanaris     RLS (restless legs syndrome) 02/18/2015   Rosacea    Rotator cuff tear 07/27/2013   Situational depression 03/02/2021   Skin lesion 01/16/2022   Sleep apnea with use of continuous positive airway pressure (CPAP) 07/15/2013   CPAP set to  7 cm water,  Residual AHi was 7.8  With no leak.  User time 6 hours.  479 days , not used only 12 days - highly compliant 06-23-13  .    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   Urinary retention 11/26/2020   Ventral hernia 07/08/2012   Vertigo 07/08/2021   Weight loss 03/03/2020   Past Surgical History:  Procedure Laterality Date   ABDOMINAL HERNIA REPAIR  07/15/12   Dr [name]   ANTERIOR CERVICAL DECOMP/DISCECTOMY FUSION N/A 07/09/2020   Procedure: ANTERIOR CERVICAL DECOMPRESSION AND FUSION CERVICAL THREE-FOUR.;  Surgeon: [name], MD;  Location: [place];  Service: Neurosurgery;  Laterality: N/A;  anterior   BROW LIFT  05/07/01   COLONOSCOPY WITH PROPOFOL N/A 04/24/2022   Procedure: COLONOSCOPY WITH PROPOFOL;  Surgeon: Thornton Park, MD;  Location: [place];  Service: Gastroenterology;  Laterality: N/A;   COLONOSCOPY WITH PROPOFOL N/A 04/26/2022   Procedure:  COLONOSCOPY WITH PROPOFOL;  Surgeon: Thornton Park, MD;  Location: [place];  Service: Gastroenterology;  Laterality: N/A;   CYSTOSCOPY WITH URETEROSCOPY AND STENT PLACEMENT Right 01/28/2014   Procedure: CYSTOSCOPY WITH URETEROSCOPY, BASKET RETRIVAL AND  STENT PLACEMENT;  Surgeon: [name], MD;  Location: [facility];  Service: Urology;  Laterality: Right;   epidural injections     multiple procedures   ESOPHAGEAL DILATION  1993 and 1994   multiple times   EYE SURGERY Bilateral 03/25/10, 2012   cataract removal   HEMORRHOID SURGERY  2014   HIATAL HERNIA REPAIR  12/21/93   HOLMIUM LASER APPLICATION Right 8/41/6606   Procedure: HOLMIUM LASER APPLICATION;  Surgeon: [name], MD;  Location: [facility];  Service: Urology;  Laterality: Right;   INGUINAL HERNIA REPAIR Right 07/15/12   Dr [name], x2  JOINT REPLACEMENT  07/25/04   right knee   JOINT REPLACEMENT  07/13/10   left knee   KNEE SURGERY  1995   LAPAROSCOPIC CHOLECYSTECTOMY  09/20/06   with intraoperative cholangiogram and right inguinal herniorrhaphy with mesh    LIGAMENT REPAIR Left 07/2013   shoulder   LITHOTRIPSY     x2   NASAL SEPTUM SURGERY  1975   POLYPECTOMY  04/26/2022   Procedure: POLYPECTOMY;  Surgeon: Thornton Park, MD;  Location: [place];  Service: Gastroenterology;;   POSTERIOR CERVICAL FUSION/FORAMINOTOMY N/A 11/24/2020   Procedure: Posterior Cervical Laminectomy Cervical three-four, Cervical four-five with lateral mass fusion;  Surgeon: [name], MD;  Location: [place];  Service: Neurosurgery;  Laterality: N/A;   REFRACTIVE SURGERY Bilateral    Right total hip replacement  4/14   SKIN CANCER DESTRUCTION     nose, ear   TONSILLECTOMY  as child   TOTAL HIP ARTHROPLASTY Left    URETHRAL DILATION  1991   [name]   Patient Active Problem List   Diagnosis Date Noted   Hand weakness 05/02/2022   Acute kidney injury ([place]) 05/02/2022   Adenomatous polyp of ascending colon    Adenomatous polyp of  colon    Acute metabolic encephalopathy 56/38/7564   GI bleed 04/21/2022   Vasovagal syncope    Hemorrhagic shock (HCC)    Symptomatic anemia    Hematochezia    Acute blood loss anemia    Irregular heart beat 02/03/2022   Constipation 02/03/2022   Carpal tunnel syndrome 01/24/2022   Numbness of hand 01/24/2022   Ulnar neuropathy 01/24/2022   Skin lesion 01/16/2022   History of COVID-19 12/05/2021   Benign paroxysmal positional vertigo 09/14/2021   Palpitations 07/25/2021   Cardiac murmur 07/25/2021   History of chicken pox 07/22/2021   History of hemorrhoids 07/22/2021   Hypertension 07/22/2021   COPD (chronic obstructive pulmonary disease) ([place]) 07/22/2021   History of kidney stones 07/22/2021   History of skin cancer 33/29/5188   Complication of anesthesia 07/22/2021   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[place]) 07/22/2021   Other abnormal glucose 07/22/2021   Spondylitis ([place]) 07/22/2021   Greater trochanteric pain syndrome 07/22/2021   Vertigo 07/08/2021   Hypokalemia 07/08/2021   Trochanteric bursitis of left hip 06/30/2021   Cancer ([place]) 04/11/2021   Lumbar radiculopathy 03/02/2021   Gait instability 03/02/2021   Hip pain 03/02/2021   Situational depression 03/02/2021   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[place]) 02/09/2021   Urinary retention 11/26/2020   Cervical myelopathy ([place]) 11/24/2020   Body mass index (BMI) 25.0-25.9, adult 11/02/2020   Lumbar spondylosis 11/02/2020   Lumbago of lumbar region with sciatica 10/21/2020   Cystoid macular edema of both eyes 07/28/2020   Status post cervical spinal fusion 07/09/2020   History of total bilateral knee replacement 07/08/2020   Aortic atherosclerosis ([place]) 03/05/2020   Emphysema, unspecified ([place]) 03/05/2020   Arthritis 03/04/2020   Diverticulosis 03/03/2020   Internal hemorrhoids 03/03/2020   Weight loss 03/03/2020   Heme positive stool 02/29/2020   Leg cramps  11/13/2019   Tibialis anterior tendon tear, nontraumatic 11/13/2019   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 07/29/2018   CPAP (continuous positive airway pressure) dependence 03/25/2018   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   Erectile dysfunction 06/20/2017   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 03/06/2017   Peripheral neuropathy 06/19/2016   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   Hearing loss 02/02/2016  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   Allergic rhinitis 02/02/2016   Atypical chest pain 11/24/2015   Preventative health care 11/24/2015   Onychomycosis 06/30/2015   Degenerative disc disease, lumbar 06/30/2015   Other allergic rhinitis 02/18/2015   Deviated nasal septum 02/18/2015   RLS (restless legs syndrome) 02/18/2015   GERD (gastroesophageal reflux disease) 09/18/2014   Hyperglycemia 03/03/2014   Rotator cuff tear 07/27/2013   Sleep apnea with use of continuous positive airway pressure (CPAP) 07/15/2013   Carotid stenosis 02/24/2013   Nonspecific abnormal electrocardiogram (ECG) (EKG) 01/06/2013   DJD (degenerative joint disease) of hip 12/24/2012   Lower GI bleed 12/12/2012   Right groin pain 11/29/2012   OSA (obstructive sleep apnea) 11/25/2012   Ventral hernia 07/08/2012   High risk medication use 01/18/2012   Dyspnea 06/12/2011   Hyperlipidemia with target LDL less than 130 12/27/2010   Osteoarthrosis, unspecified whether generalized or localized, pelvic region and thigh 07/25/2010   Osteoarthrosis, hand 06/13/2010   Anemia 12/17/2009   Hypothyroidism 05/13/2009   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   Benign essential hypertension 05/13/2009   Right inguinal hernia 05/13/2009   HIATAL HERNIA 05/13/2009   NEPHROLITHIASIS 05/13/2009   ROSACEA 05/13/2009    PCP: [name], NP  REFERRING PROVIDER: [name], NP  REFERRING DIAG: R26.81 (ICD-10-CM) - Gait instability   THERAPY DIAG:  Unsteadiness on  feet  Other abnormalities of gait and mobility  Muscle weakness (generalized)  Repeated falls  Other low back pain  RATIONALE FOR EVALUATION AND TREATMENT:  Rehabilitation  ONSET DATE:  Fall 01/12/22 (2 falls in past 6 months)  SUBJECTIVE:   SUBJECTIVE STATEMENT: Pt reports only 5 hours of sleep last night. He continues to deny pain, although does note some tightness in his back today. Also still feeling weak.  PAIN:  Are you having pain? No  PERTINENT HISTORY:  Aortic atherosclerosis; carotid stenosis; cervical myelopathy s/p ACDF 06/2020 and posterior cervical fusion 11/2020; peripheral neuropathy; lumbar DDD/spondylosis; lumbago with sciatica/lumbar radiculopathy; OA; B TKA; B THA; L greater trochanteric pain syndrome; L RCR 2014; recent CTR surgery - early May; hearing loss; HTN; BPPV/vertigo; B sacroiliitis; skin cancer; COPD/emphysema; sleep apnea with CPAP; GERD; gout; HLD; LE cramps; RLS  PRECAUTIONS: None  WEIGHT BEARING RESTRICTIONS: No  FALLS: Has patient fallen in last 6 months? Yes. 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 No acute intracranial abnormality.  2. No acute fracture or traumatic subluxation of the cervical spine. L wrist x-ray: 1. Mild  dorsal wrist soft tissue swelling with small ossific density overlying the proximal carpal row. Correlate for triquetral fracture.  2. Moderate scaphotrapeziotrapezoid and mild first CMC joint osteoarthritis. L elbow x-ray: 1. No acute fracture or dislocation, left elbow.  2. Mild elevation of the anterior fat  pad suggestive of a small joint effusion, which may be reactive in the setting of elbow osteoarthritis. L shoulder x-rays: No acute osseous abnormality.  COGNITION: Overall cognitive status: Within functional limits for tasks assessed and Impaired: Memory: Impaired: Short term Procedural   SENSATION: Peripheral neuropathy in B feet  COORDINATION: Mildly impaired with slow movements  EDEMA:  Mild to moderate edema in distal B LE  MUSCLE LENGTH: Hamstrings: mod tight R>L ITB: mod tight B Piriformis: mod tight L>R Hip flexors: mod tight B Quads: mod tight B Heelcord: mod tight B  POSTURE:  rounded shoulders, forward head, and flexed trunk   LOWER EXTREMITY MMT:   Tested in sitting on eval 02/22/22, 03/22/22, 04/17/22 & 05/04/22 MMT Right 02/22/22 Left 02/22/22 Right 03/22/22 Left 03/22/22 Right 04/17/22 Left  04/17/22 Right  05/04/22 Left  05/04/22 Right  06/06/22 Left  06/06/22  Hip flexion 3+ 4- 3+ 4- 4- 4- 3+ 3+ 4- 4  Hip extension 3- 3- 3+ 3+ 3+ 3+ 3 3 3+ 4-  Hip abduction 4- 4 4 4+ 4 4+ 4- 4 4- 4  Hip adduction _0 4+ 4+ 4+ _1 4+  Hip internal rotation 3 3+ 3 3+ 3 3+ 3- 3 3+ 4-  Hip external rotation 3+ 3+ 3+ 3+ 3+ 3+ 3 3+ 3+ 4-  Knee flexion 4- 4- 4 4 4+ 4+ 4- 4- 4 4+  Knee extension 4- 4 4 4+ 4+ 4+ 4 4 4+ 4+  Ankle dorsiflexion 4- 4 4- 4 4 4- 4- 4- 4- 4  Ankle plantarflexion 3+  6 SLS HR 4 10 SLS HR 4+ 20 SLS HR Limited lift on last few reps 5 20 SLS HR 5- 18 SLS HR 5 20 SLS HR 4- 7 SLS HR Pt noting increased proximal LE pain 4+ 15 SLS HR 5- 20 SLS HR Limited lift on last few reps 5 20 SLS HR  Ankle inversion 3+ 4- 3+ 4- 4- 4- 4- 4- 4- 4  Ankle eversion  3+ 4- 3+ 4- 4- 4- 3+ 3+ 4- 4  (Blank rows = not tested)  BED MOBILITY:  Sit to supine SBA Supine to sit SBA  TRANSFERS: Assistive device utilized: Single point cane and None  Sit to stand: Modified independence Stand to sit: Modified independence Chair to chair: Modified independence Floor:  NT  GAIT: Gait pattern: step through pattern, decreased stride length, decreased hip/knee flexion- Right, decreased hip/knee flexion- Left, decreased trunk rotation, and wide BOS Distance walked: 80 Assistive device utilized: Single point cane Level of assistance: SBA Comments: Gait speed: 2.10 ft/sec with SPC, 2.31 ft/sec w/o AD  RAMP: Level of Assistance:  NT Assistive device utilized:    Ramp Comments:   CURB:  Level of Assistance:  NT Assistive device utilized:    Curb Comments:   STAIRS:  Level of Assistance:  NT  Stair Negotiation Technique:   Number of Stairs:    Height of Stairs:   Comments:   FUNCTIONAL TESTS:  5 times sit to stand: 13.0 sec Timed up and go (TUG): 13.53 sec with SPC; 14.19 sec w/o AD 10 meter walk test: 15.60 sec with SPC; 13.04 sec w/o AD Berg Balance Scale: 39/56 - scores 37-45 indicate significant risk for falls (>80%) Dynamic Gait Index: 11/24; Scores of 19 or less are predictive of falls in older community living adults  PATIENT SURVEYS:  ABC scale 55% of self confidence; 45% impairment  TODAY'S TREATMENT:   06/08/22 THERAPEUTIC EXERCISE: to  improve flexibility, strength and mobility.  Verbal and tactile cues throughout for technique. NuStep - L6 x 6 min (UE/LE) Seated 3-way lumbar flexion stretch with hands supported on SPC 2 x 30-60" each  Supine DKTC with feet resting on orange Pball 20 x 5" Supine LTR with feet resting on orange Pball 10 x 5" Bridge + RTB hip ABD isometric 10 x 5" - L HS cramp on 1st rep (relieved with manual TPR and stretching) - cues to avoid digging in with heels to reduce risk for cramping TrA + RTB march 10 x 3", 2  sets R/L sidelying RTB clam 10 x 3" Sit <> stand + RTB hip ABD isometric x 10 - pt tending to shift more L during transition    06/06/22 THERAPEUTIC ACTIVITIES: MMT 5x STS: 15.40 sec TUG: 12.66 sec with SPC; 11.87 sec w/o AD  10MWT: 12.12 sec with SPC; 10.37 sec w/o AD (more unsteady) Gait speed: 2.71 ft/sec with SPC; 3.16 ft/sec w/o AD Berg Balance Scale: 43/56 - scores 37-45 = significant fall risk  (>80%)  DGI: 14/24; Scores of 19 or less are predictive of falls in older community living adults   05/29/22 THERAPEUTIC EXERCISE: to improve flexibility, strength and mobility.  Verbal and tactile cues throughout for technique. NuStep - L6 x 6 min (UE/LE) Standing B heel-toe raises x 20, UE support on counter for all standing exercises Counter squats 2 x 10 Standing R/L hip extension with looped RTB at ankles 2 x 10 Standing R/L hip abduction with looped RTB at ankles 2 x 10 Standing R/L hip flexion march with looped RTB at midfeet 2 x 10 Seated RTB alt unilateral hip ABD/ER clam 2 x 10  NEUROMUSCULAR RE-EDUCATION: To improve balance, coordination, and reduce fall risk. B Side stepping along counter with hands hovering 4 x 10 ft Tandem stance 2 x 30 sec - intermittent UE support on counter Tandem gait along counter 2 x 10 ft fwd, 1 x 10 ft backwards Alt toe clears to 9" step with intermittent UE support on counter x 20 Reciprocal fwd step-over 3 pairs of balance pebbles x 10, intermittent UE support on counter Lateral step-over 3 pairs of balance pebbles x 10, UE support on counter   PATIENT EDUCATION: Education details: progress with PT and ongoing PT POC Person educated: Patient Education method: Explanation Education comprehension: verbalized understanding   HOME EXERCISE PROGRAM: Access Code: [email or web address] URL: [email or web address] Date: 03/22/2022 Prepared by: Annie Paras  Exercises - Standing Gastroc Stretch at Counter  - 1-2 x daily - 7 x weekly - 2-3  reps - 30 sec hold - Standing Soleus Stretch at Counter  - 1-2 x daily - 7 x weekly - 2-3 reps - 30 sec hold - Hip Flexor Stretch on Step  - 1-2 x daily - 7 x weekly - 2-3 reps - 30 sec hold - Side Stepping with Resistance at Ankles and Counter Support  - 1 x daily - 7 x weekly - 2 sets - 10 reps - Forward and Backward Step Over with Counter Support  - 1 x daily - 7 x weekly - 2 sets - 10 reps - Turning in Corner 360  - 1 x daily - 7 x weekly - 2 sets - 5 reps - Tandem Stance in Corner  - 1 x daily - 7 x weekly - 2 reps - 30 sec hold - Single Leg Stance with Support  - 1 x daily - 7 x weekly - 2 sets -  30 sec hold - Squat with Chair Touch  - 1 x daily - 7 x weekly - 2 sets - 10 reps - 3 sec hold - Seated Hamstring Stretch  - 1-2 x daily - 7 x weekly - 2 sets - 30 sec hold - Seated Hip Adductor Stretch  - 1-2 x daily - 7 x weekly - 3 reps - 30 sec hold - Seated Figure 4 Piriformis Stretch  - 1-2 x daily - 7 x weekly - 2 sets - 30 sec hold - Supine Piriformis Stretch with Foot on Ground  - 1-2 x daily - 7 x weekly - 2-3 reps - 30 sec hold - Supine Lower Trunk Rotation  - 1 x daily - 7 x weekly - 2 sets - 30 sec hold - Bent Knee Fallouts with Alternating Legs  - 1 x daily - 7 x weekly - 2 sets - 10 reps - 3 sec hold - Seated Hip Abduction with Resistance  - 1 x daily - 3-4 x weekly - 2 sets - 10 reps - 3-5 sec hold - Seated Single Leg Hip Abduction with Resistance  - 1 x daily - 3-4 x weekly - 2 sets - 10 reps - 3 sec hold - Seated March with Resistance  - 1 x daily - 3-4 x weekly - 2 sets - 10 reps - 3 sec hold - Seated Shoulder Row with Anchored Resistance  - 1 x daily - 3-4 x weekly - 2 sets - 10 reps - 3-5 hold hold  Patient Education - Check for Safety   ASSESSMENT:  CLINICAL IMPRESSION: Arik Husmann" reports more stiffness in his back today, therefore session initiated with gentle lumbopelvic stretching progressing to strengthening. Bridging limited initially by L HS cramp with R  gastroc cramp also reported at end of set. Provided education on need for proper hydration to minimize muscle cramping as well as use of stretches to mitigate cramps. Pt also tending to shift more to stronger L side, with cues necessary to keep more even weight shift. [name] noting his back felt more limber by end of session but still notes fatigue (mostly like due to combo of limited sleep last night and ongoing anemia related weakness) and plans to go home an take a nap.  OBJECTIVE IMPAIRMENTS Abnormal gait, decreased activity tolerance, decreased balance, decreased endurance, decreased knowledge of condition, decreased knowledge of use of DME, decreased mobility, difficulty walking, decreased ROM, decreased strength, decreased safety awareness, increased fascial restrictions, impaired perceived functional ability, increased muscle spasms, impaired flexibility, improper body mechanics, postural dysfunction, and pain.   ACTIVITY LIMITATIONS carrying, lifting, bending, standing, squatting, stairs, transfers, and locomotion level  PARTICIPATION LIMITATIONS: cleaning, laundry, interpersonal relationship, community activity, and yard work  PERSONAL FACTORS Age, Fitness, Past/current experiences, Time since onset of injury/illness/exacerbation, and 3+ comorbidities: Aortic atherosclerosis; carotid stenosis; cervical myelopathy s/p ACDF 06/2020 and posterior cervical fusion 11/2020; peripheral neuropathy; lumbar DDD/spondylosis; lumbago with sciatica/lumbar radiculopathy; OA; B TKA; BTHA; L greater trochanteric pain syndrome; L RCR 2014; recent CTR surgery - early May; hearing loss; HTN; BPPV/vertigo; B sacroiliitis; skin cancer; COPD/emphysema; sleep apnea with CPAP; GERD; gout; HLD; LE cramps; RLS  are also affecting patient's functional outcome.   REHAB POTENTIAL: Good  CLINICAL DECISION MAKING: Evolving/moderate complexity  EVALUATION COMPLEXITY: Moderate  GOALS: Goals reviewed with patient?  Yes  SHORT TERM GOALS: Target date: 03/22/2022, extended to 06/01/22   Patient will be independent with initial HEP. Baseline:  Goal status: MET  03/22/22  2.  Patient will demonstrate decreased fall risk by scoring > 45/56 on Berg. Baseline: 39/56 (eval - 02/22/22); 43/56 (03/22/22); 48/56 (04/17/22); decreased to 40/56 (05/04/22) following hospitalization; 43/56 (06/06/22) Goal status: IN PROGRESS  06/06/22 - Increased to 43/56  3.  Patient will be educated on strategies to decrease risk of falls.  Baseline:  Goal status: MET  03/22/22 - Education provided last visit - Pt reports no concerns as he went through the home checklist other than the bathroom mat non-skid backing has disintegrated from repeated washing (he states he pushes it to the side while he is in the BR)   LONG TERM GOALS: Target date: 04/19/2022, extended to 05/29/2022, then 06/29/22   Patient will be independent with advanced/ongoing HEP to improve outcomes and carryover.  Baseline:  Goal status: IN PROGRESS  05/29/22 - Pt instructed to rotate through HEP exercise in smaller groups to reduce fatigue and allow for recovery time.  2.  Patient will be able to ambulate 600' with LRAD with good safety to access community.  Baseline:  Goal status: IN PROGRESS  3.  Patient will be able to step up/down curb safely with LRAD for safety with community ambulation.  Baseline:  Goal status: MET  04/17/22  4.  Patient will demonstrate improved functional LE strength as demonstrated by overall MMT >/= 4 to 4+/5. Baseline:  Goal status: IN PROGRESS  06/06/22 - Slowly improving but R weaker than L (refer to MMT chart)  5.  Patient will demonstrate at least 19/24 on DGI to improve gait stability and reduce risk for falls. Baseline: 11/24 (eval); 16/24 (03/31/22); 18/24 (04/17/22); 12/24 (05/04/22); 14/24 (06/06/22) Goal status: IN PROGRESS  9/26//23 - Increased to 14/24   6.  Patient will score >/= 52/56 on Berg Balance test to demonstrate lower  risk of falls. (MCID= 8 points) .  Baseline: 39/56 (eval - 02/22/22); 43/56 (03/22/22); 48/56 (04/17/22); 40/56 (05/04/22); 43/56 (06/06/22) Goal status: IN PROGRESS  06/06/22 - Increased to 43/56  7.  Patient will report >67% of self confidence on ABC scale to demonstrate improved functional ability. Baseline: 55% self confidence (eval); 53.8 % (04/17/22) Goal status: IN PROGRESS  8.  Patient will demonstrate gait speed of >/= 2.62 ft/sec (0.80 m/s) to be a safe community ambulator with decreased risk for recurrent falls.  Baseline: 2.10 ft/sec with SPC, 2.31 ft/sec w/o AD (eval); 2.90 ft/sec with SPC; 2.92 ft/sec w/o AD (03/31/22); 2.99 ft/sec with SPC; 3.14 ft/sec w/o AD (04/17/22); 2.77 ft/sec with SPC; 3.09 ft/sec w/o AD but much more unsteady (05/04/22);  2.71 ft/sec with SPC; 3.16 ft/sec w/o AD (06/06/22) Goal status: MET  03/31/22   PLAN: PT FREQUENCY: 2x/week  PT DURATION: 7-8 weeks  PLANNED INTERVENTIONS: Therapeutic exercises, Therapeutic activity, Neuromuscular re-education, Balance training, Gait training, Patient/Family education, Joint mobilization, Stair training, Vestibular training, Canalith repositioning, Dry Needling, Electrical stimulation, Spinal manipulation, Cryotherapy, Moist heat, Taping, Ultrasound, Ionotophoresis 87m/ml Dexamethasone, Manual therapy, and Re-evaluation  PLAN FOR NEXT SESSION:  progress lumbopelvic/LE flexibility and strengthening; balance training with close guarding; HEP review/update/modification as needed   JPercival Spanish PT 06/08/2022, 12:02 PM

## 2022-06-09 DIAGNOSIS — R29898 Other symptoms and signs involving the musculoskeletal system: Secondary | ICD-10-CM | POA: Diagnosis not present

## 2022-06-13 ENCOUNTER — Ambulatory Visit (INDEPENDENT_AMBULATORY_CARE_PROVIDER_SITE_OTHER): Payer: Medicare HMO | Admitting: Family

## 2022-06-13 ENCOUNTER — Ambulatory Visit: Payer: Medicare HMO | Attending: Family

## 2022-06-13 VITALS — BP 133/52 | HR 69 | Temp 97.6°F | Resp 18 | Ht 67.0 in | Wt 154.6 lb

## 2022-06-13 DIAGNOSIS — M5459 Other low back pain: Secondary | ICD-10-CM | POA: Diagnosis not present

## 2022-06-13 DIAGNOSIS — R296 Repeated falls: Secondary | ICD-10-CM | POA: Diagnosis not present

## 2022-06-13 DIAGNOSIS — M5416 Radiculopathy, lumbar region: Secondary | ICD-10-CM | POA: Diagnosis not present

## 2022-06-13 DIAGNOSIS — R2689 Other abnormalities of gait and mobility: Secondary | ICD-10-CM | POA: Insufficient documentation

## 2022-06-13 DIAGNOSIS — I10 Essential (primary) hypertension: Secondary | ICD-10-CM

## 2022-06-13 DIAGNOSIS — M6281 Muscle weakness (generalized): Secondary | ICD-10-CM | POA: Diagnosis not present

## 2022-06-13 DIAGNOSIS — R2681 Unsteadiness on feet: Secondary | ICD-10-CM | POA: Diagnosis not present

## 2022-06-13 NOTE — Therapy (Signed)
OUTPATIENT PHYSICAL THERAPY  TREATMENT    Patient Name: [name] Pratt MRN: [id number] DOB:Feb 02, 1941, 81 y.o., male Today's Date: 06/13/2022      PT End of Session - 06/13/22 1021     Visit Number 21    Number of Visits 27    Date for PT Re-Evaluation 06/29/22    Authorization Type [name]    Authorization Time Period 02/24/22 - 07/18/22    Authorization - Visit Number 20    Authorization - Number of Visits 26    Progress Note Due on Visit 23   Recert completed on visit #13 - 05/04/22   PT Start Time 0934    PT Stop Time 1015    PT Time Calculation (min) 41 min    Activity Tolerance Patient tolerated treatment well    Behavior During Therapy [facility] for tasks assessed/performed                        Past Medical History:  Diagnosis Date   Allergic rhinitis 02/02/2016   Anemia 12/17/2009   Formatting of this note might be different from the original. Anemia  10/1 IMO update   Aortic atherosclerosis ([place]) 03/05/2020   Arthritis    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   Atypical chest pain 11/24/2015   Benign essential hypertension 05/13/2009   Qualifier: Diagnosis of  By: Larwance Sachs of this note might be different from the original. Hypertension   Benign paroxysmal positional vertigo 09/14/2021   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[place]) 07/22/2021   Body mass index (BMI) 25.0-25.9, adult 11/02/2020   Cancer ([place])    skin cancer   Cardiac murmur 07/25/2021   Carotid stenosis    a. Carotid U/S 6/78: LICA < 93%, RICA 81-01%;  b.  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    Cystoid macular edema of both eyes 07/28/2020   Degenerative disc disease, lumbar 06/30/2015   Deviated nasal septum 02/18/2015   Diverticulosis 03/03/2020   DJD (degenerative joint disease) of hip 12/24/2012   Dyspnea 06/12/2011   Emphysema, unspecified ([place]) 03/05/2020   Erectile dysfunction 06/20/2017   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[place]) 02/09/2021   Gait disorder 03/02/2021   GERD (gastroesophageal reflux disease)    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   Qualifier: Diagnosis of  By: Burnett Kanaris     Greater trochanteric pain syndrome 07/22/2021   Hand pain, left 01/16/2022   Hearing loss 02/02/2016   Heme positive stool 02/29/2020   HIATAL HERNIA 05/13/2009   Qualifier: Diagnosis of  By: Burnett Kanaris     High risk medication use 01/18/2012   Hip pain 03/02/2021   History of chicken pox    History of COVID-19 12/05/2021   History of hemorrhoids    History of kidney stones    History of skin cancer 07/22/2021   skin cancer   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 Cardiology - Dr Frances Nickels  at [facility] cardiology  ICD-10 cut over   Hypertension    under control   Hypokalemia 07/08/2021   Hypothyroidism    Internal hemorrhoids 03/03/2020   Leg cramps 11/13/2019   Lower GI bleed 12/12/2012   Lumbago of lumbar region with sciatica 10/21/2020   Lumbar radiculopathy 03/02/2021   Lumbar spondylosis 11/02/2020   NEPHROLITHIASIS 05/13/2009   Qualifier: Diagnosis of  By: Burnett Kanaris     Nonspecific abnormal electrocardiogram (ECG) (EKG) 01/06/2013   Numbness of hand 01/24/2022   Onychomycosis 06/30/2015   Osteoarthrosis, 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 Osteoarthritis Of The Hand  10/1 IMO update    Osteoarthrosis, unspecified whether generalized or localized, pelvic region and thigh 07/25/2010   Formatting of this note might be different from the original. Osteoarthritis Of The Hip   Other abnormal glucose 07/22/2021   Other allergic rhinitis 02/18/2015   Palpitations 07/25/2021   Peripheral neuropathy 06/19/2016   Preventative health care 11/24/2015   Right groin pain 11/29/2012   Right inguinal hernia 05/13/2009   Qualifier: Diagnosis of  By: Burnett Kanaris     RLS (restless legs syndrome) 02/18/2015   Rosacea    Rotator cuff tear 07/27/2013   Situational depression 03/02/2021   Skin lesion 01/16/2022   Sleep apnea with use of continuous positive airway pressure (CPAP) 07/15/2013   CPAP set to  7 cm water,  Residual AHi was 7.8  With no leak.  User time 6 hours.  479 days , not used only 12 days - highly compliant 06-23-13  .    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   Urinary retention 11/26/2020   Ventral hernia 07/08/2012   Vertigo 07/08/2021   Weight loss 03/03/2020   Past Surgical History:  Procedure Laterality Date   ABDOMINAL HERNIA REPAIR  07/15/12   Dr [name]   ANTERIOR CERVICAL DECOMP/DISCECTOMY FUSION N/A 07/09/2020   Procedure: ANTERIOR CERVICAL DECOMPRESSION AND FUSION CERVICAL THREE-FOUR.;  Surgeon: [name], MD;  Location: [place];  Service: Neurosurgery;  Laterality: N/A;  anterior   BROW LIFT  05/07/01   COLONOSCOPY WITH PROPOFOL N/A 04/24/2022   Procedure: COLONOSCOPY WITH PROPOFOL;  Surgeon: Thornton Park, MD;  Location: [place];  Service: Gastroenterology;  Laterality: N/A;   COLONOSCOPY WITH PROPOFOL N/A 04/26/2022    Procedure: COLONOSCOPY WITH PROPOFOL;  Surgeon: Thornton Park, MD;  Location: [place];  Service: Gastroenterology;  Laterality: N/A;   CYSTOSCOPY WITH URETEROSCOPY AND STENT PLACEMENT Right 01/28/2014   Procedure: CYSTOSCOPY WITH URETEROSCOPY, BASKET RETRIVAL AND  STENT PLACEMENT;  Surgeon: [name], MD;  Location: [facility];  Service: Urology;  Laterality: Right;   epidural injections     multiple procedures   ESOPHAGEAL DILATION  1993 and 1994   multiple times   EYE SURGERY Bilateral 03/25/10, 2012   cataract removal   HEMORRHOID SURGERY  2014   HIATAL HERNIA REPAIR  12/21/93   HOLMIUM LASER APPLICATION Right 9/56/2130   Procedure: HOLMIUM LASER APPLICATION;  Surgeon: [name], MD;  Location: [facility];  Service: Urology;  Laterality: Right;   INGUINAL HERNIA REPAIR Right 07/15/12   Dr [name], x2  JOINT REPLACEMENT  07/25/04   right knee   JOINT REPLACEMENT  07/13/10   left knee   KNEE SURGERY  1995   LAPAROSCOPIC CHOLECYSTECTOMY  09/20/06   with intraoperative cholangiogram and right inguinal herniorrhaphy with mesh    LIGAMENT REPAIR Left 07/2013   shoulder   LITHOTRIPSY     x2   NASAL SEPTUM SURGERY  1975   POLYPECTOMY  04/26/2022   Procedure: POLYPECTOMY;  Surgeon: Thornton Park, MD;  Location: [place];  Service: Gastroenterology;;   POSTERIOR CERVICAL FUSION/FORAMINOTOMY N/A 11/24/2020   Procedure: Posterior Cervical Laminectomy Cervical three-four, Cervical four-five with lateral mass fusion;  Surgeon: [name], MD;  Location: [place];  Service: Neurosurgery;  Laterality: N/A;   REFRACTIVE SURGERY Bilateral    Right total hip replacement  4/14   SKIN CANCER DESTRUCTION     nose, ear   TONSILLECTOMY  as child   TOTAL HIP ARTHROPLASTY Left    URETHRAL DILATION  1991   [name]   Patient Active Problem List   Diagnosis Date Noted   Hand weakness 05/02/2022   Acute kidney injury ([place]) 05/02/2022   Adenomatous polyp of ascending colon     Adenomatous polyp of colon    Acute metabolic encephalopathy 61/44/3154   GI bleed 04/21/2022   Vasovagal syncope    Hemorrhagic shock (HCC)    Symptomatic anemia    Hematochezia    Acute blood loss anemia    Irregular heart beat 02/03/2022   Constipation 02/03/2022   Carpal tunnel syndrome 01/24/2022   Numbness of hand 01/24/2022   Ulnar neuropathy 01/24/2022   Skin lesion 01/16/2022   History of COVID-19 12/05/2021   Benign paroxysmal positional vertigo 09/14/2021   Palpitations 07/25/2021   Cardiac murmur 07/25/2021   History of chicken pox 07/22/2021   History of hemorrhoids 07/22/2021   Hypertension 07/22/2021   COPD (chronic obstructive pulmonary disease) ([place]) 07/22/2021   History of kidney stones 07/22/2021   History of skin cancer 00/86/7619   Complication of anesthesia 07/22/2021   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[place]) 07/22/2021   Other abnormal glucose 07/22/2021   Spondylitis ([place]) 07/22/2021   Greater trochanteric pain syndrome 07/22/2021   Vertigo 07/08/2021   Hypokalemia 07/08/2021   Trochanteric bursitis of left hip 06/30/2021   Cancer ([place]) 04/11/2021   Lumbar radiculopathy 03/02/2021   Gait instability 03/02/2021   Hip pain 03/02/2021   Situational depression 03/02/2021   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[place]) 02/09/2021   Urinary retention 11/26/2020   Cervical myelopathy ([place]) 11/24/2020   Body mass index (BMI) 25.0-25.9, adult 11/02/2020   Lumbar spondylosis 11/02/2020   Lumbago of lumbar region with sciatica 10/21/2020   Cystoid macular edema of both eyes 07/28/2020   Status post cervical spinal fusion 07/09/2020   History of total bilateral knee replacement 07/08/2020   Aortic atherosclerosis ([place]) 03/05/2020   Emphysema, unspecified ([place]) 03/05/2020   Arthritis 03/04/2020   Diverticulosis 03/03/2020   Internal hemorrhoids 03/03/2020   Weight loss 03/03/2020   Heme positive stool  02/29/2020   Leg cramps 11/13/2019   Tibialis anterior tendon tear, nontraumatic 11/13/2019   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 07/29/2018   CPAP (continuous positive airway pressure) dependence 03/25/2018   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   Erectile dysfunction 06/20/2017   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 03/06/2017   Peripheral neuropathy 06/19/2016   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   Hearing loss 02/02/2016  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   Allergic rhinitis 02/02/2016   Atypical chest pain 11/24/2015   Preventative health care 11/24/2015   Onychomycosis 06/30/2015   Degenerative disc disease, lumbar 06/30/2015   Other allergic rhinitis 02/18/2015   Deviated nasal septum 02/18/2015   RLS (restless legs syndrome) 02/18/2015   GERD (gastroesophageal reflux disease) 09/18/2014   Hyperglycemia 03/03/2014   Rotator cuff tear 07/27/2013   Sleep apnea with use of continuous positive airway pressure (CPAP) 07/15/2013   Carotid stenosis 02/24/2013   Nonspecific abnormal electrocardiogram (ECG) (EKG) 01/06/2013   DJD (degenerative joint disease) of hip 12/24/2012   Lower GI bleed 12/12/2012   Right groin pain 11/29/2012   OSA (obstructive sleep apnea) 11/25/2012   Ventral hernia 07/08/2012   High risk medication use 01/18/2012   Dyspnea 06/12/2011   Hyperlipidemia with target LDL less than 130 12/27/2010   Osteoarthrosis, unspecified whether generalized or localized, pelvic region and thigh 07/25/2010   Osteoarthrosis, hand 06/13/2010   Anemia 12/17/2009   Hypothyroidism 05/13/2009   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   Benign essential hypertension 05/13/2009   Right inguinal hernia 05/13/2009   HIATAL HERNIA 05/13/2009   NEPHROLITHIASIS 05/13/2009   ROSACEA 05/13/2009    PCP: [name], NP  REFERRING PROVIDER: [name], NP  REFERRING DIAG: R26.81 (ICD-10-CM) - Gait instability   THERAPY DIAG:   Unsteadiness on feet  Other abnormalities of gait and mobility  Muscle weakness (generalized)  Repeated falls  Other low back pain  RATIONALE FOR EVALUATION AND TREATMENT:  Rehabilitation  ONSET DATE:  Fall 01/12/22 (2 falls in past 6 months)  SUBJECTIVE:   SUBJECTIVE STATEMENT: Reports increased LBP from last visit, feels like he did too many reps, used a heating pad which helped the pain a lot. Today is not so bad.  PAIN:  Are you having pain? No  PERTINENT HISTORY:  Aortic atherosclerosis; carotid stenosis; cervical myelopathy s/p ACDF 06/2020 and posterior cervical fusion 11/2020; peripheral neuropathy; lumbar DDD/spondylosis; lumbago with sciatica/lumbar radiculopathy; OA; B TKA; B THA; L greater trochanteric pain syndrome; L RCR 2014; recent CTR surgery - early May; hearing loss; HTN; BPPV/vertigo; B sacroiliitis; skin cancer; COPD/emphysema; sleep apnea with CPAP; GERD; gout; HLD; LE cramps; RLS  PRECAUTIONS: None  WEIGHT BEARING RESTRICTIONS: No  FALLS: Has patient fallen in last 6 months? Yes. 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 No acute intracranial abnormality.  2. No acute fracture or traumatic subluxation of the cervical spine. L wrist x-ray:  1. Mild dorsal wrist soft tissue swelling with small ossific density overlying the proximal carpal row. Correlate for triquetral fracture.  2. Moderate scaphotrapeziotrapezoid and mild first CMC joint osteoarthritis. L elbow x-ray: 1. No acute fracture or dislocation, left elbow.  2. Mild elevation of the anterior  fat pad suggestive of a small joint effusion, which may be reactive in the setting of elbow osteoarthritis. L shoulder x-rays: No acute osseous abnormality.  COGNITION: Overall cognitive status: Within functional limits for tasks assessed and Impaired: Memory: Impaired: Short term Procedural   SENSATION: Peripheral neuropathy in B feet  COORDINATION: Mildly impaired with slow movements  EDEMA:  Mild to moderate edema in distal B LE  MUSCLE LENGTH: Hamstrings: mod tight R>L ITB: mod tight B Piriformis: mod tight L>R Hip flexors: mod tight B Quads: mod tight B Heelcord: mod tight B  POSTURE:  rounded shoulders, forward head, and flexed trunk   LOWER EXTREMITY MMT:   Tested in sitting on eval 02/22/22, 03/22/22, 04/17/22 & 05/04/22 MMT Right 02/22/22 Left 02/22/22 Right 03/22/22 Left 03/22/22 Right 04/17/22 Left  04/17/22 Right  05/04/22 Left  05/04/22 Right  06/06/22 Left  06/06/22  Hip flexion 3+ 4- 3+ 4- 4- 4- 3+ 3+ 4- 4  Hip extension 3- 3- 3+ 3+ 3+ 3+ 3 3 3+ 4-  Hip abduction 4- 4 4 4+ 4 4+ 4- 4 4- 4  Hip adduction '4 4 4 ' 4+ 4+ 4+ '4 4 4 ' 4+  Hip internal rotation 3 3+ 3 3+ 3 3+ 3- 3 3+ 4-  Hip external rotation 3+ 3+ 3+ 3+ 3+ 3+ 3 3+ 3+ 4-  Knee flexion 4- 4- 4 4 4+ 4+ 4- 4- 4 4+  Knee extension 4- 4 4 4+ 4+ 4+ 4 4 4+ 4+  Ankle dorsiflexion 4- 4 4- 4 4 4- 4- 4- 4- 4  Ankle plantarflexion 3+  6 SLS HR 4 10 SLS HR 4+ 20 SLS HR Limited lift on last few reps 5 20 SLS HR 5- 18 SLS HR 5 20 SLS HR 4- 7 SLS HR Pt noting increased proximal LE pain 4+ 15 SLS HR 5- 20 SLS HR Limited lift on last few reps 5 20 SLS HR  Ankle inversion 3+ 4- 3+ 4- 4- 4- 4- 4- 4- 4  Ankle  eversion 3+ 4- 3+ 4- 4- 4- 3+ 3+ 4- 4  (Blank rows = not tested)  BED MOBILITY:  Sit to supine SBA Supine to sit SBA  TRANSFERS: Assistive device utilized: Single point cane and None  Sit to stand: Modified independence Stand to sit: Modified independence Chair to chair: Modified independence Floor:  NT  GAIT: Gait pattern: step through pattern, decreased stride length, decreased hip/knee flexion- Right, decreased hip/knee flexion- Left, decreased trunk rotation, and wide BOS Distance walked: 80 Assistive device utilized: Single point cane Level of assistance: SBA Comments: Gait speed: 2.10 ft/sec with SPC, 2.31 ft/sec w/o AD  RAMP: Level of Assistance:  NT Assistive device utilized:    Ramp Comments:   CURB:  Level of Assistance:  NT Assistive device utilized:    Curb Comments:   STAIRS:  Level of Assistance:  NT  Stair Negotiation Technique:   Number of Stairs:    Height of Stairs:   Comments:   FUNCTIONAL TESTS:  5 times sit to stand: 13.0 sec Timed up and go (TUG): 13.53 sec with SPC; 14.19 sec w/o AD 10 meter walk test: 15.60 sec with SPC; 13.04 sec w/o AD Berg Balance Scale: 39/56 - scores 37-45 indicate significant risk for falls (>80%) Dynamic Gait Index: 11/24; Scores of 19 or less are predictive of falls in older community living adults  PATIENT SURVEYS:  ABC scale 55% of self confidence; 45% impairment  TODAY'S TREATMENT:  06/13/22 Therapeutic Exercise: Recumbent  Bike L1x19mn - mildly aggravated LB Seated lumbar flexion rollout AROM green pball 10x 3 sec hold Seated deadlift x 15 Seated pallof press yellow wt ball x 10 Seated trunk rotations with yellow wt ball x 10 Standing shld ext on lat pull machine 10# x 10 Traditional lat pulls in standing 15# x 10 Seated row 20# x 10  06/08/22 THERAPEUTIC EXERCISE: to improve flexibility, strength and mobility.  Verbal and tactile cues throughout for technique. NuStep - L6 x 6 min (UE/LE) Seated 3-way  lumbar flexion stretch with hands supported on SPC 2 x 30-60" each  Supine DKTC with feet resting on orange Pball 20 x 5" Supine LTR with feet resting on orange Pball 10 x 5" Bridge + RTB hip ABD isometric 10 x 5" - L HS cramp on 1st rep (relieved with manual TPR and stretching) - cues to avoid digging in with heels to reduce risk for cramping TrA + RTB march 10 x 3", 2 sets R/L sidelying RTB clam 10 x 3" Sit <> stand + RTB hip ABD isometric x 10 - pt tending to shift more L during transition    06/06/22 THERAPEUTIC ACTIVITIES: MMT 5x STS: 15.40 sec TUG: 12.66 sec with SPC; 11.87 sec w/o AD  10MWT: 12.12 sec with SPC; 10.37 sec w/o AD (more unsteady) Gait speed: 2.71 ft/sec with SPC; 3.16 ft/sec w/o AD Berg Balance Scale: 43/56 - scores 37-45 = significant fall risk  (>80%)  DGI: 14/24; Scores of 19 or less are predictive of falls in older community living adults    PATIENT EDUCATION: Education details: progress with PT and ongoing PT POC Person educated: Patient Education method: Explanation Education comprehension: verbalized understanding   HOME EXERCISE PROGRAM: Access Code: [email or web address]: [email or web address] Date: 03/22/2022 Prepared by: JAnnie Paras Exercises - Standing Gastroc Stretch at Counter  - 1-2 x daily - 7 x weekly - 2-3 reps - 30 sec hold - Standing Soleus Stretch at Counter  - 1-2 x daily - 7 x weekly - 2-3 reps - 30 sec hold - Hip Flexor Stretch on Step  - 1-2 x daily - 7 x weekly - 2-3 reps - 30 sec hold - Side Stepping with Resistance at Ankles and Counter Support  - 1 x daily - 7 x weekly - 2 sets - 10 reps - Forward and Backward Step Over with Counter Support  - 1 x daily - 7 x weekly - 2 sets - 10 reps - Turning in Corner 360  - 1 x daily - 7 x weekly - 2 sets - 5 reps - Tandem Stance in Corner  - 1 x daily - 7 x weekly - 2 reps - 30 sec hold - Single Leg Stance with Support  - 1 x daily - 7 x weekly - 2 sets - 30 sec hold - Squat with  Chair Touch  - 1 x daily - 7 x weekly - 2 sets - 10 reps - 3 sec hold - Seated Hamstring Stretch  - 1-2 x daily - 7 x weekly - 2 sets - 30 sec hold - Seated Hip Adductor Stretch  - 1-2 x daily - 7 x weekly - 3 reps - 30 sec hold - Seated Figure 4 Piriformis Stretch  - 1-2 x daily - 7 x weekly - 2 sets - 30 sec hold - Supine Piriformis Stretch with Foot on Ground  - 1-2 x daily - 7 x weekly - 2-3 reps - 30 sec hold - Supine  Lower Trunk Rotation  - 1 x daily - 7 x weekly - 2 sets - 30 sec hold - Bent Knee Fallouts with Alternating Legs  - 1 x daily - 7 x weekly - 2 sets - 10 reps - 3 sec hold - Seated Hip Abduction with Resistance  - 1 x daily - 3-4 x weekly - 2 sets - 10 reps - 3-5 sec hold - Seated Single Leg Hip Abduction with Resistance  - 1 x daily - 3-4 x weekly - 2 sets - 10 reps - 3 sec hold - Seated March with Resistance  - 1 x daily - 3-4 x weekly - 2 sets - 10 reps - 3 sec hold - Seated Shoulder Row with Anchored Resistance  - 1 x daily - 3-4 x weekly - 2 sets - 10 reps - 3-5 hold hold  Patient Education - Check for Safety   ASSESSMENT:  CLINICAL IMPRESSION: [name] Pratt reported last session seemed to aggravate his back for 2 days but after using heat on his back his pain decreased. Focused on lumbar AROM and core exercises in hopes of decreasing pain. He required rest in between each exercises to allow for recovery. He demonstrated a good tolerance for the exercises and good technique with guidance given from therapist. After session, pt reported full resolution of LBP.  OBJECTIVE IMPAIRMENTS Abnormal gait, decreased activity tolerance, decreased balance, decreased endurance, decreased knowledge of condition, decreased knowledge of use of DME, decreased mobility, difficulty walking, decreased ROM, decreased strength, decreased safety awareness, increased fascial restrictions, impaired perceived functional ability, increased muscle spasms, impaired flexibility, improper body mechanics,  postural dysfunction, and pain.   ACTIVITY LIMITATIONS carrying, lifting, bending, standing, squatting, stairs, transfers, and locomotion level  PARTICIPATION LIMITATIONS: cleaning, laundry, interpersonal relationship, community activity, and yard work  PERSONAL FACTORS Age, Fitness, Past/current experiences, Time since onset of injury/illness/exacerbation, and 3+ comorbidities: Aortic atherosclerosis; carotid stenosis; cervical myelopathy s/p ACDF 06/2020 and posterior cervical fusion 11/2020; peripheral neuropathy; lumbar DDD/spondylosis; lumbago with sciatica/lumbar radiculopathy; OA; B TKA; BTHA; L greater trochanteric pain syndrome; L RCR 2014; recent CTR surgery - early May; hearing loss; HTN; BPPV/vertigo; B sacroiliitis; skin cancer; COPD/emphysema; sleep apnea with CPAP; GERD; gout; HLD; LE cramps; RLS  are also affecting patient's functional outcome.   REHAB POTENTIAL: Good  CLINICAL DECISION MAKING: Evolving/moderate complexity  EVALUATION COMPLEXITY: Moderate  GOALS: Goals reviewed with patient? Yes  SHORT TERM GOALS: Target date: 03/22/2022, extended to 06/01/22   Patient will be independent with initial HEP. Baseline:  Goal status: MET  03/22/22  2.  Patient will demonstrate decreased fall risk by scoring > 45/56 on Berg. Baseline: 39/56 (eval - 02/22/22); 43/56 (03/22/22); 48/56 (04/17/22); decreased to 40/56 (05/04/22) following hospitalization; 43/56 (06/06/22) Goal status: IN PROGRESS  06/06/22 - Increased to 43/56  3.  Patient will be educated on strategies to decrease risk of falls.  Baseline:  Goal status: MET  03/22/22 - Education provided last visit - Pt reports no concerns as he went through the home checklist other than the bathroom mat non-skid backing has disintegrated from repeated washing (he states he pushes it to the side while he is in the BR)   LONG TERM GOALS: Target date: 04/19/2022, extended to 05/29/2022, then 06/29/22   Patient will be independent with  advanced/ongoing HEP to improve outcomes and carryover.  Baseline:  Goal status: IN PROGRESS  05/29/22 - Pt instructed to rotate through HEP exercise in smaller groups to reduce fatigue and allow for recovery time.  2.  Patient will be able to ambulate 600' with LRAD with good safety to access community.  Baseline:  Goal status: IN PROGRESS  3.  Patient will be able to step up/down curb safely with LRAD for safety with community ambulation.  Baseline:  Goal status: MET  04/17/22  4.  Patient will demonstrate improved functional LE strength as demonstrated by overall MMT >/= 4 to 4+/5. Baseline:  Goal status: IN PROGRESS  06/06/22 - Slowly improving but R weaker than L (refer to MMT chart)  5.  Patient will demonstrate at least 19/24 on DGI to improve gait stability and reduce risk for falls. Baseline: 11/24 (eval); 16/24 (03/31/22); 18/24 (04/17/22); 12/24 (05/04/22); 14/24 (06/06/22) Goal status: IN PROGRESS  9/26//23 - Increased to 14/24   6.  Patient will score >/= 52/56 on Berg Balance test to demonstrate lower risk of falls. (MCID= 8 points) .  Baseline: 39/56 (eval - 02/22/22); 43/56 (03/22/22); 48/56 (04/17/22); 40/56 (05/04/22); 43/56 (06/06/22) Goal status: IN PROGRESS  06/06/22 - Increased to 43/56  7.  Patient will report >67% of self confidence on ABC scale to demonstrate improved functional ability. Baseline: 55% self confidence (eval); 53.8 % (04/17/22) Goal status: IN PROGRESS  8.  Patient will demonstrate gait speed of >/= 2.62 ft/sec (0.80 m/s) to be a safe community ambulator with decreased risk for recurrent falls.  Baseline: 2.10 ft/sec with SPC, 2.31 ft/sec w/o AD (eval); 2.90 ft/sec with SPC; 2.92 ft/sec w/o AD (03/31/22); 2.99 ft/sec with SPC; 3.14 ft/sec w/o AD (04/17/22); 2.77 ft/sec with SPC; 3.09 ft/sec w/o AD but much more unsteady (05/04/22);  2.71 ft/sec with SPC; 3.16 ft/sec w/o AD (06/06/22) Goal status: MET  03/31/22   PLAN: PT FREQUENCY: 2x/week  PT DURATION: 7-8  weeks  PLANNED INTERVENTIONS: Therapeutic exercises, Therapeutic activity, Neuromuscular re-education, Balance training, Gait training, Patient/Family education, Joint mobilization, Stair training, Vestibular training, Canalith repositioning, Dry Needling, Electrical stimulation, Spinal manipulation, Cryotherapy, Moist heat, Taping, Ultrasound, Ionotophoresis 59m/ml Dexamethasone, Manual therapy, and Re-evaluation  PLAN FOR NEXT SESSION:  progress lumbopelvic/LE flexibility and strengthening; balance training with close guarding; HEP review/update/modification as needed   BArtist Pais PTA 06/13/2022, 10:21 AM

## 2022-06-13 NOTE — Progress Notes (Signed)
Subjective:   By signing my name below, I, Carylon Perches, attest that this documentation has been prepared under the direction and in the presence of Karie Chimera, NP 06/13/2022   Patient ID: [name] Pratt, male    DOB: 1940-09-30, 81 y.o.   MRN: [id number]  Chief Complaint  Patient presents with   HTN follow up    Restarted amlodipine 2.'5mg'$  at last OV    HPI Patient is in today for an office visit  Blood Pressure: His blood pressure is improving. Since last visit, he restarted his 2.5 Mg of Amlodipine BP Readings from Last 3 Encounters:  06/13/22 (!) 133/52  06/07/22 (!) 153/59  05/02/22 (!) 139/56   Pulse Readings from Last 3 Encounters:  06/13/22 69  06/07/22 63  05/02/22 (!) 58     Immunizations: He is UTD on influenza vaccine.   Health Maintenance Due  Topic Date Due   TETANUS/TDAP  12/26/2020   FOOT EXAM  09/29/2021   COVID-19 Vaccine (5 - Pfizer risk series) 11/08/2021    Past Medical History:  Diagnosis Date   Allergic rhinitis 02/02/2016   Anemia 12/17/2009   Formatting of this note might be different from the original. Anemia  10/1 IMO update   Aortic atherosclerosis ([place]) 03/05/2020   Arthritis    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   Atypical chest pain 11/24/2015   Benign essential hypertension 05/13/2009   Qualifier: Diagnosis of  By: Larwance Sachs of this note might be different from the original. 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 Carotid U/S 5/40: LICA < 08%, RICA 67-61%;  b.  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   Cystoid macular edema of both eyes 07/28/2020   Degenerative disc disease, lumbar 06/30/2015   Deviated nasal septum 02/18/2015   Diverticulosis 03/03/2020   DJD (degenerative joint disease) of hip 12/24/2012   Dyspnea 06/12/2011   Emphysema, unspecified ([place]) 03/05/2020   Erectile dysfunction 06/20/2017   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[place]) 02/09/2021   Gait disorder 03/02/2021   GERD (gastroesophageal reflux disease)    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   Qualifier: Diagnosis of  By: Burnett Kanaris     Greater trochanteric pain syndrome 07/22/2021   Hand pain, left 01/16/2022   Hearing loss 02/02/2016   Heme positive stool 02/29/2020   HIATAL HERNIA 05/13/2009   Qualifier: Diagnosis of  By: Burnett Kanaris     High risk medication use 01/18/2012   Hip pain 03/02/2021   History of chicken pox    History of COVID-19 12/05/2021   History of hemorrhoids    History of kidney stones    History of skin cancer 07/22/2021   skin cancer   History of total bilateral knee replacement 07/08/2020   Hyperglycemia 03/03/2014   Hyperlipidemia with target LDL less than 130 12/27/2010   Formatting of this  note might be different from the original. Cardiology - Dr Frances Nickels at [facility] cardiology  ICD-10 cut over   Hypertension    under control   Hypokalemia 07/08/2021   Hypothyroidism    Internal hemorrhoids 03/03/2020   Leg cramps 11/13/2019   Lower GI bleed 12/12/2012   Lumbago of lumbar region with sciatica 10/21/2020   Lumbar radiculopathy 03/02/2021   Lumbar spondylosis 11/02/2020   NEPHROLITHIASIS 05/13/2009   Qualifier: Diagnosis of  By: Burnett Kanaris     Nonspecific abnormal electrocardiogram (ECG) (EKG) 01/06/2013   Numbness of hand 01/24/2022    Onychomycosis 06/30/2015   Osteoarthrosis, 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 Osteoarthritis Of The Hand  10/1 IMO update   Osteoarthrosis, unspecified whether generalized or localized, pelvic region and thigh 07/25/2010   Formatting of this note might be different from the original. Osteoarthritis Of The Hip   Other abnormal glucose 07/22/2021   Other allergic rhinitis 02/18/2015   Palpitations 07/25/2021   Peripheral neuropathy 06/19/2016   Preventative health care 11/24/2015   Right groin pain 11/29/2012   Right inguinal hernia 05/13/2009   Qualifier: Diagnosis of  By: Burnett Kanaris     RLS (restless legs syndrome) 02/18/2015   Rosacea    Rotator cuff tear 07/27/2013   Situational depression 03/02/2021   Skin lesion 01/16/2022   Sleep apnea with use of continuous positive airway pressure (CPAP) 07/15/2013   CPAP set to  7 cm water,  Residual AHi was 7.8  With no leak.  User time 6 hours.  479 days , not used only 12 days - highly compliant 06-23-13  .    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   Urinary retention 11/26/2020   Ventral hernia 07/08/2012   Vertigo 07/08/2021   Weight loss 03/03/2020    Past Surgical History:  Procedure Laterality Date   ABDOMINAL HERNIA REPAIR  07/15/12   Dr [name]   ANTERIOR CERVICAL DECOMP/DISCECTOMY FUSION N/A 07/09/2020   Procedure: ANTERIOR CERVICAL DECOMPRESSION AND FUSION CERVICAL THREE-FOUR.;  Surgeon: [name], MD;  Location: [place];  Service: Neurosurgery;  Laterality: N/A;  anterior   BROW LIFT  05/07/01   COLONOSCOPY WITH PROPOFOL N/A 04/24/2022   Procedure:  COLONOSCOPY WITH PROPOFOL;  Surgeon: Thornton Park, MD;  Location: [place];  Service: Gastroenterology;  Laterality: N/A;   COLONOSCOPY WITH PROPOFOL N/A 04/26/2022   Procedure: COLONOSCOPY WITH PROPOFOL;  Surgeon: Thornton Park, MD;  Location: [place];  Service: Gastroenterology;  Laterality: N/A;   CYSTOSCOPY WITH URETEROSCOPY AND STENT PLACEMENT Right 01/28/2014   Procedure: CYSTOSCOPY WITH URETEROSCOPY, BASKET RETRIVAL AND  STENT PLACEMENT;  Surgeon: [name], MD;  Location: [facility];  Service: Urology;  Laterality: Right;   epidural injections     multiple procedures   ESOPHAGEAL DILATION  1993 and 1994   multiple times   EYE SURGERY Bilateral 03/25/10, 2012   cataract removal   HEMORRHOID SURGERY  2014   HIATAL HERNIA REPAIR  12/21/93   HOLMIUM LASER APPLICATION Right 7/89/3810   Procedure: HOLMIUM LASER APPLICATION;  Surgeon: [name], MD;  Location: [facility];  Service: Urology;  Laterality: Right;  INGUINAL HERNIA REPAIR Right 07/15/12   Dr [name], x2   JOINT REPLACEMENT  07/25/04   right knee   JOINT REPLACEMENT  07/13/10   left knee   [place]  09/20/06   with intraoperative cholangiogram and right inguinal herniorrhaphy with mesh    LIGAMENT REPAIR Left 07/2013   shoulder   LITHOTRIPSY     x2   [place]   POLYPECTOMY  04/26/2022   Procedure: POLYPECTOMY;  Surgeon: Thornton Park, MD;  Location: [place];  Service: Gastroenterology;;   POSTERIOR CERVICAL FUSION/FORAMINOTOMY N/A 11/24/2020   Procedure: Posterior Cervical Laminectomy Cervical three-four, Cervical four-five with lateral mass fusion;  Surgeon: [name], MD;  Location: [place];  Service: Neurosurgery;  Laterality: N/A;   REFRACTIVE SURGERY Bilateral    Right total hip replacement  4/14   SKIN CANCER DESTRUCTION     nose, ear   TONSILLECTOMY  as child   TOTAL HIP ARTHROPLASTY Left    URETHRAL DILATION  1991   [name]     Family History  Problem Relation Age of Onset   Cancer Mother    Hypertension Other    Cancer Other    Osteoarthritis Other     Social History   Socioeconomic History   Marital status: Married    Spouse name: Not on file   Number of children: 1   Years of education: Not on file   Highest education level: Not on file  Occupational History   Occupation: firefighter    Comment: paints on the side  Tobacco Use   Smoking status: Former    Years: 11.00    Types: Cigarettes    Quit date: 09/11/1978    Years since quitting: 43.7   Smokeless tobacco: Never  Vaping Use   Vaping Use: Never used  Substance and Sexual Activity   Alcohol use: Not Currently    Alcohol/week: 0.0 standard drinks of alcohol    Comment: rare   Drug use: No   Sexual activity: Not Currently  Other Topics Concern   Not on file  Social History Narrative   Lives with his wife   He has one son- lives locally.   2 grandchildren   Has worked for Research officer, trade union   Enjoys wood working   Completed HS.  [employer]   Social Determinants of Health   Financial Resource Strain: Low Risk  (10/28/2021)   Overall Financial Resource Strain (CARDIA)    Difficulty of Paying Living Expenses: Not very hard  Food Insecurity: No Food Insecurity (06/05/2022)   Hunger Vital Sign    Worried About Running Out of Food in the Last Year: Never true    Ran Out of Food in the Last Year: Never true  Transportation Needs: No Transportation Needs (06/05/2022)   PRAPARE - Hydrologist (Medical): No    Lack of Transportation (Non-Medical): No  Physical Activity: Insufficiently Active (10/28/2021)   Exercise Vital Sign    Days of Exercise per Week: 2 days    Minutes of Exercise per Session: 30 min  Stress: No Stress Concern Present (01/19/2022)   [place]    Feeling of Stress : Only a little  Social Connections: Moderately Isolated  (01/19/2022)   Social Connection and Isolation Panel [NHANES]    Frequency of Communication with Friends and Family: More than three times a week    Frequency  of Social Gatherings with Friends and Family: More than three times a week    Attends Religious Services: Never    Marine scientist or Organizations: No    Attends Archivist Meetings: Never    Marital Status: Married  Human resources officer Violence: Not At Risk (01/19/2022)   Humiliation, Afraid, Rape, and Kick questionnaire    Fear of Current or Ex-Partner: No    Emotionally Abused: No    Physically Abused: No    Sexually Abused: No    Outpatient Medications Prior to Visit  Medication Sig Dispense Refill   acetaminophen (TYLENOL) 650 MG CR tablet Take 650-1,300 mg by mouth See admin instructions. Take 1,300 mg by mouth in the morning and 650 mg at bedtime     albuterol (VENTOLIN HFA) 108 (90 Base) MCG/ACT inhaler INHALE 2 PUFFS INTO THE LUNGS EVERY 6 HOURS AS NEEDED FOR SHORTNESS OF BREATH. (Patient taking differently: Inhale 2 puffs into the lungs every 6 (six) hours as needed for shortness of breath.) 1 each 3   allopurinol (ZYLOPRIM) 100 MG tablet TAKE 1 TABLET TWICE DAILY (Patient taking differently: Take 100 mg by mouth in the morning and at bedtime.) 180 tablet 0   amLODipine (NORVASC) 2.5 MG tablet Take 1 tablet (2.5 mg total) by mouth daily. 90 tablet 0   antiseptic oral rinse (BIOTENE) LIQD 15 mLs by Mouth Rinse route in the morning.     aspirin EC 81 MG tablet Take 81 mg by mouth at bedtime.     cycloSPORINE (RESTASIS) 0.05 % ophthalmic emulsion Place 1 drop into both eyes 2 (two) times daily.     diclofenac Sodium (VOLTAREN) 1 % GEL Apply 2 g topically daily as needed (for arthritic pain in the hands).     docusate sodium (COLACE) 100 MG capsule Take 1 capsule (100 mg total) by mouth 2 (two) times daily. 60 capsule 11   fluocinonide (LIDEX) 0.05 % external solution Apply 1 application  topically 2 (two) times  daily as needed for itching (SCALP).     GEMTESA 75 MG TABS Take 75 mg by mouth daily.     glucose blood test strip TRUE Metrix Blood Glucose Test Strip, use as instructed 100 each 12   Homeopathic Products (THERAWORX RELIEF) FOAM Apply 1 application topically 2 (two) times daily as needed (Pain).     hydrALAZINE (APRESOLINE) 50 MG tablet Take 1 tablet (50 mg total) by mouth 2 (two) times daily. (Patient taking differently: Take 50 mg by mouth in the morning and at bedtime.) 60 tablet 2   Lancets MISC 1 each by Does not apply route 2 (two) times daily. TRUE matrix lancets 100 each 3   levothyroxine (SYNTHROID) 150 MCG tablet Take 150 mcg 6 days a week (patient takes 75 mcg once a week) Disp #84 for 90 day supply (Patient taking differently: Take 150 mcg by mouth See admin instructions. Take 150 mcg by mouth in the morning before breakfast on Mon/Tues/Wed/Thurs/Fri/Sat) 84 tablet 3   levothyroxine (SYNTHROID) 75 MCG tablet Take 75 mcg once a week (patient on 150 mcg 6 days a week) Dispense #24 for 90 day supply. (Patient taking differently: Take 75 mcg by mouth See admin instructions. Take 75 mcg by mouth in the morning before breakfast on Sundays only) 24 tablet 3   lip balm (CARMEX) ointment Apply 1 Application topically as needed for lip care or dry skin.     loratadine (CLARITIN) 10 MG tablet Take 10 mg by mouth  daily as needed for allergies or rhinitis.     losartan (COZAAR) 100 MG tablet TAKE 1 TABLET EVERY DAY (Patient taking differently: Take 100 mg by mouth daily after supper.) 90 tablet 1   Magnesium 500 MG TABS Take 500 mg by mouth daily.     multivitamin (THERAGRAN) per tablet Take 1 tablet by mouth daily with breakfast.     nitroGLYCERIN (NITROSTAT) 0.4 MG SL tablet Place 1 tablet (0.4 mg total) under the tongue every 5 (five) minutes as needed for chest pain. 25 tablet 6   ondansetron (ZOFRAN-ODT) 4 MG disintegrating tablet Take 1 tablet (4 mg total) by mouth every 8 (eight) hours as  needed for nausea or vomiting. (Patient taking differently: Take 4 mg by mouth every 8 (eight) hours as needed for nausea or vomiting (dissolve orally).) 10 tablet 0   polyethylene glycol (MIRALAX / GLYCOLAX) 17 g packet Take 17-34 g by mouth daily as needed for constipation (mix and drink).     PRESCRIPTION MEDICATION CPAP- At bedtime and during any time of rest     rosuvastatin (CRESTOR) 5 MG tablet TAKE 1 TABLET EVERY DAY (Patient taking differently: Take 5 mg by mouth daily.) 90 tablet 1   sodium chloride (OCEAN) 0.65 % nasal spray Place 1 spray into both nostrils at bedtime.     traMADol (ULTRAM) 50 MG tablet Take 1 tablet (50 mg total) by mouth every 12 (twelve) hours as needed. 10 tablet 0   vitamin B-12 (CYANOCOBALAMIN) 500 MCG tablet Take 500 mcg by mouth daily.     No facility-administered medications prior to visit.    Allergies  Allergen Reactions   Pravastatin Other (See Comments)    Muscle cramps   Sildenafil Other (See Comments)    Tachycardia     Oxycodone Other (See Comments)    Hallucinations    Atenolol Other (See Comments)    Bradycardia    Elastic Bandages & [Zinc] Other (See Comments)    Teflon bandages - Irritation   Metoclopramide Hcl Anxiety    ROS See HPI    Objective:    Physical Exam Constitutional:      General: He is not in acute distress.    Appearance: Normal appearance. He is not ill-appearing.  HENT:     Head: Normocephalic and atraumatic.     Right Ear: External ear normal.     Left Ear: External ear normal.  Eyes:     Extraocular Movements: Extraocular movements intact.     Pupils: Pupils are equal, round, and reactive to light.  Cardiovascular:     Rate and Rhythm: Normal rate and regular rhythm.     Heart sounds: Normal heart sounds. No murmur heard.    No gallop.  Pulmonary:     Effort: Pulmonary effort is normal. No respiratory distress.     Breath sounds: Normal breath sounds. No wheezing or rales.  Skin:    General: Skin  is warm and dry.  Neurological:     Mental Status: He is alert and oriented to person, place, and time.  Psychiatric:        Mood and Affect: Mood normal.        Behavior: Behavior normal.        Judgment: Judgment normal.     BP (!) 133/52 (BP Location: Right Arm, Patient Position: Sitting, Cuff Size: Normal)   Pulse 69   Temp 97.6 F (36.4 C) (Oral)   Resp 18   Ht '5\' 7"'$  (1.702 m)  Wt 154 lb 9.6 oz (70.1 kg)   SpO2 97%   BMI 24.21 kg/m  Wt Readings from Last 3 Encounters:  06/13/22 154 lb 9.6 oz (70.1 kg)  06/07/22 155 lb (70.3 kg)  05/02/22 152 lb (68.9 kg)       Assessment & Plan:   Problem List Items Addressed This Visit       Unprioritized   Hypertension - Primary    BP Readings from Last 3 Encounters:  06/13/22 (!) 133/52  06/07/22 (!) 153/59  05/02/22 (!) 139/56  BP much better. Continue amlodipine 2.'5mg'$ .       No orders of the defined types were placed in this encounter.   I, Nance Pear, NP, personally preformed the services described in this documentation.  All medical record entries made by the scribe were at my direction and in my presence.  I have reviewed the chart and discharge instructions (if applicable) and agree that the record reflects my personal performance and is accurate and complete. 06/13/2022   I,Amber Collins,acting as a scribe for Nance Pear, NP.,have documented all relevant documentation on the behalf of Nance Pear, NP,as directed by  Nance Pear, NP while in the presence of Nance Pear, NP.    Nance Pear, NP

## 2022-06-13 NOTE — Assessment & Plan Note (Signed)
BP Readings from Last 3 Encounters:  06/13/22 (!) 133/52  06/07/22 (!) 153/59  05/02/22 (!) 139/56   BP much better. Continue amlodipine 2.'5mg'$ .

## 2022-06-14 DIAGNOSIS — N401 Enlarged prostate with lower urinary tract symptoms: Secondary | ICD-10-CM | POA: Diagnosis not present

## 2022-06-14 DIAGNOSIS — N3941 Urge incontinence: Secondary | ICD-10-CM | POA: Diagnosis not present

## 2022-06-15 ENCOUNTER — Ambulatory Visit: Payer: Medicare HMO | Admitting: Physical Therapy

## 2022-06-15 ENCOUNTER — Encounter: Payer: Self-pay | Admitting: Physical Therapy

## 2022-06-15 DIAGNOSIS — M6281 Muscle weakness (generalized): Secondary | ICD-10-CM

## 2022-06-15 DIAGNOSIS — R2689 Other abnormalities of gait and mobility: Secondary | ICD-10-CM

## 2022-06-15 DIAGNOSIS — M5459 Other low back pain: Secondary | ICD-10-CM | POA: Diagnosis not present

## 2022-06-15 DIAGNOSIS — R2681 Unsteadiness on feet: Secondary | ICD-10-CM

## 2022-06-15 DIAGNOSIS — R296 Repeated falls: Secondary | ICD-10-CM | POA: Diagnosis not present

## 2022-06-15 NOTE — Therapy (Signed)
OUTPATIENT PHYSICAL THERAPY  TREATMENT    Patient Name: [name] Pratt MRN: [id number] DOB:1941-03-19, 81 y.o., male Today's Date: 06/15/2022      PT End of Session - 06/15/22 1016     Visit Number 22    Number of Visits 27    Date for PT Re-Evaluation 06/29/22    Authorization Type [name]    Authorization Time Period 02/24/22 - 07/18/22    Authorization - Visit Number 21    Authorization - Number of Visits 26    Progress Note Due on Visit 23   Recert completed on visit #13 - 05/04/22   PT Start Time 1016    PT Stop Time 1102    PT Time Calculation (min) 46 min    Activity Tolerance Patient tolerated treatment well    Behavior During Therapy [facility] for tasks assessed/performed                         Past Medical History:  Diagnosis Date   Allergic rhinitis 02/02/2016   Anemia 12/17/2009   Formatting of this note might be different from the original. Anemia  10/1 IMO update   Aortic atherosclerosis ([place]) 03/05/2020   Arthritis    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   Atypical chest pain 11/24/2015   Benign essential hypertension 05/13/2009   Qualifier: Diagnosis of  By: Larwance Sachs of this note might be different from the original. Hypertension   Benign paroxysmal positional vertigo 09/14/2021   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[place]) 07/22/2021   Body mass index (BMI) 25.0-25.9, adult 11/02/2020   Cancer ([place])    skin cancer   Cardiac murmur 07/25/2021   Carotid stenosis    a. Carotid U/S 7/61: LICA < 60%, RICA 73-71%;  b.  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    Cystoid macular edema of both eyes 07/28/2020   Degenerative disc disease, lumbar 06/30/2015   Deviated nasal septum 02/18/2015   Diverticulosis 03/03/2020   DJD (degenerative joint disease) of hip 12/24/2012   Dyspnea 06/12/2011   Emphysema, unspecified ([place]) 03/05/2020   Erectile dysfunction 06/20/2017   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[place]) 02/09/2021   Gait disorder 03/02/2021   GERD (gastroesophageal reflux disease)    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   Qualifier: Diagnosis of  By: Burnett Kanaris     Greater trochanteric pain syndrome 07/22/2021   Hand pain, left 01/16/2022   Hearing loss 02/02/2016   Heme positive stool 02/29/2020   HIATAL HERNIA 05/13/2009   Qualifier: Diagnosis of  By: Burnett Kanaris     High risk medication use 01/18/2012   Hip pain 03/02/2021   History of chicken pox    History of COVID-19 12/05/2021   History of hemorrhoids    History of kidney stones    History of skin cancer 07/22/2021   skin cancer   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 Cardiology - Dr  Nishen at [facility] cardiology  ICD-10 cut over   Hypertension    under control   Hypokalemia 07/08/2021   Hypothyroidism    Internal hemorrhoids 03/03/2020   Leg cramps 11/13/2019   Lower GI bleed 12/12/2012   Lumbago of lumbar region with sciatica 10/21/2020   Lumbar radiculopathy 03/02/2021   Lumbar spondylosis 11/02/2020   NEPHROLITHIASIS 05/13/2009   Qualifier: Diagnosis of  By: Burnett Kanaris     Nonspecific abnormal electrocardiogram (ECG) (EKG) 01/06/2013   Numbness of hand 01/24/2022   Onychomycosis 06/30/2015   Osteoarthrosis, 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 Osteoarthritis Of The Hand  10/1 IMO update    Osteoarthrosis, unspecified whether generalized or localized, pelvic region and thigh 07/25/2010   Formatting of this note might be different from the original. Osteoarthritis Of The Hip   Other abnormal glucose 07/22/2021   Other allergic rhinitis 02/18/2015   Palpitations 07/25/2021   Peripheral neuropathy 06/19/2016   Preventative health care 11/24/2015   Right groin pain 11/29/2012   Right inguinal hernia 05/13/2009   Qualifier: Diagnosis of  By: Burnett Kanaris     RLS (restless legs syndrome) 02/18/2015   Rosacea    Rotator cuff tear 07/27/2013   Situational depression 03/02/2021   Skin lesion 01/16/2022   Sleep apnea with use of continuous positive airway pressure (CPAP) 07/15/2013   CPAP set to  7 cm water,  Residual AHi was 7.8  With no leak.  User time 6 hours.  479 days , not used only 12 days - highly compliant 06-23-13  .    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   Urinary retention 11/26/2020   Ventral hernia 07/08/2012   Vertigo 07/08/2021   Weight loss 03/03/2020   Past Surgical History:  Procedure Laterality Date   ABDOMINAL HERNIA REPAIR  07/15/12   Dr [name]   ANTERIOR CERVICAL DECOMP/DISCECTOMY FUSION N/A 07/09/2020   Procedure: ANTERIOR CERVICAL DECOMPRESSION AND FUSION CERVICAL THREE-FOUR.;  Surgeon: [name], MD;  Location: [place];  Service: Neurosurgery;  Laterality: N/A;  anterior   BROW LIFT  05/07/01   COLONOSCOPY WITH PROPOFOL N/A 04/24/2022   Procedure: COLONOSCOPY WITH PROPOFOL;  Surgeon: Thornton Park, MD;  Location: [place];  Service: Gastroenterology;  Laterality: N/A;   COLONOSCOPY WITH PROPOFOL N/A 04/26/2022    Procedure: COLONOSCOPY WITH PROPOFOL;  Surgeon: Thornton Park, MD;  Location: [place];  Service: Gastroenterology;  Laterality: N/A;   CYSTOSCOPY WITH URETEROSCOPY AND STENT PLACEMENT Right 01/28/2014   Procedure: CYSTOSCOPY WITH URETEROSCOPY, BASKET RETRIVAL AND  STENT PLACEMENT;  Surgeon: [name], MD;  Location: [facility];  Service: Urology;  Laterality: Right;   epidural injections     multiple procedures   ESOPHAGEAL DILATION  1993 and 1994   multiple times   EYE SURGERY Bilateral 03/25/10, 2012   cataract removal   HEMORRHOID SURGERY  2014   HIATAL HERNIA REPAIR  12/21/93   HOLMIUM LASER APPLICATION Right 2/83/6629   Procedure: HOLMIUM LASER APPLICATION;  Surgeon: [name], MD;  Location: [facility];  Service: Urology;  Laterality: Right;   INGUINAL HERNIA REPAIR Right 07/15/12   Dr [name], x2  JOINT REPLACEMENT  07/25/04   right knee   JOINT REPLACEMENT  07/13/10   left knee   KNEE SURGERY  1995   LAPAROSCOPIC CHOLECYSTECTOMY  09/20/06   with intraoperative cholangiogram and right inguinal herniorrhaphy with mesh    LIGAMENT REPAIR Left 07/2013   shoulder   LITHOTRIPSY     x2   NASAL SEPTUM SURGERY  1975   POLYPECTOMY  04/26/2022   Procedure: POLYPECTOMY;  Surgeon: Thornton Park, MD;  Location: [place];  Service: Gastroenterology;;   POSTERIOR CERVICAL FUSION/FORAMINOTOMY N/A 11/24/2020   Procedure: Posterior Cervical Laminectomy Cervical three-four, Cervical four-five with lateral mass fusion;  Surgeon: [name], MD;  Location: [place];  Service: Neurosurgery;  Laterality: N/A;   REFRACTIVE SURGERY Bilateral    Right total hip replacement  4/14   SKIN CANCER DESTRUCTION     nose, ear   TONSILLECTOMY  as child   TOTAL HIP ARTHROPLASTY Left    URETHRAL DILATION  1991   [name]   Patient Active Problem List   Diagnosis Date Noted   Hand weakness 05/02/2022   Acute kidney injury ([place]) 05/02/2022   Adenomatous polyp of ascending colon     Adenomatous polyp of colon    Acute metabolic encephalopathy 99/35/7017   GI bleed 04/21/2022   Vasovagal syncope    Hemorrhagic shock (HCC)    Symptomatic anemia    Hematochezia    Acute blood loss anemia    Irregular heart beat 02/03/2022   Constipation 02/03/2022   Carpal tunnel syndrome 01/24/2022   Numbness of hand 01/24/2022   Ulnar neuropathy 01/24/2022   Skin lesion 01/16/2022   History of COVID-19 12/05/2021   Benign paroxysmal positional vertigo 09/14/2021   Palpitations 07/25/2021   Cardiac murmur 07/25/2021   History of chicken pox 07/22/2021   History of hemorrhoids 07/22/2021   Hypertension 07/22/2021   COPD (chronic obstructive pulmonary disease) ([place]) 07/22/2021   History of kidney stones 07/22/2021   History of skin cancer 79/39/0300   Complication of anesthesia 07/22/2021   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[place]) 07/22/2021   Other abnormal glucose 07/22/2021   Spondylitis ([place]) 07/22/2021   Greater trochanteric pain syndrome 07/22/2021   Vertigo 07/08/2021   Hypokalemia 07/08/2021   Trochanteric bursitis of left hip 06/30/2021   Cancer ([place]) 04/11/2021   Lumbar radiculopathy 03/02/2021   Gait instability 03/02/2021   Hip pain 03/02/2021   Situational depression 03/02/2021   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[place]) 02/09/2021   Urinary retention 11/26/2020   Cervical myelopathy ([place]) 11/24/2020   Body mass index (BMI) 25.0-25.9, adult 11/02/2020   Lumbar spondylosis 11/02/2020   Lumbago of lumbar region with sciatica 10/21/2020   Cystoid macular edema of both eyes 07/28/2020   Status post cervical spinal fusion 07/09/2020   History of total bilateral knee replacement 07/08/2020   Aortic atherosclerosis ([place]) 03/05/2020   Emphysema, unspecified ([place]) 03/05/2020   Arthritis 03/04/2020   Diverticulosis 03/03/2020   Internal hemorrhoids 03/03/2020   Weight loss 03/03/2020   Heme positive stool  02/29/2020   Leg cramps 11/13/2019   Tibialis anterior tendon tear, nontraumatic 11/13/2019   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 07/29/2018   CPAP (continuous positive airway pressure) dependence 03/25/2018   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   Erectile dysfunction 06/20/2017   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 03/06/2017   Peripheral neuropathy 06/19/2016   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   Hearing loss 02/02/2016  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   Allergic rhinitis 02/02/2016   Atypical chest pain 11/24/2015   Preventative health care 11/24/2015   Onychomycosis 06/30/2015   Degenerative disc disease, lumbar 06/30/2015   Other allergic rhinitis 02/18/2015   Deviated nasal septum 02/18/2015   RLS (restless legs syndrome) 02/18/2015   GERD (gastroesophageal reflux disease) 09/18/2014   Hyperglycemia 03/03/2014   Rotator cuff tear 07/27/2013   Sleep apnea with use of continuous positive airway pressure (CPAP) 07/15/2013   Carotid stenosis 02/24/2013   Nonspecific abnormal electrocardiogram (ECG) (EKG) 01/06/2013   DJD (degenerative joint disease) of hip 12/24/2012   Lower GI bleed 12/12/2012   Right groin pain 11/29/2012   OSA (obstructive sleep apnea) 11/25/2012   Ventral hernia 07/08/2012   High risk medication use 01/18/2012   Dyspnea 06/12/2011   Hyperlipidemia with target LDL less than 130 12/27/2010   Osteoarthrosis, unspecified whether generalized or localized, pelvic region and thigh 07/25/2010   Osteoarthrosis, hand 06/13/2010   Anemia 12/17/2009   Hypothyroidism 05/13/2009   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   Benign essential hypertension 05/13/2009   Right inguinal hernia 05/13/2009   HIATAL HERNIA 05/13/2009   NEPHROLITHIASIS 05/13/2009   ROSACEA 05/13/2009    PCP: [name], NP  REFERRING PROVIDER: [name], NP  REFERRING DIAG: R26.81 (ICD-10-CM) - Gait instability   THERAPY DIAG:   Unsteadiness on feet  Other abnormalities of gait and mobility  Muscle weakness (generalized)  Repeated falls  Other low back pain  RATIONALE FOR EVALUATION AND TREATMENT:  Rehabilitation  ONSET DATE:  Fall 01/12/22 (2 falls in past 6 months)  SUBJECTIVE:   SUBJECTIVE STATEMENT: Pt reports [name] wants to do surgery on his back but not yet scheduled. Tired today because he didn't sleep well.  PAIN:  Are you having pain? Yes: NPRS scale: when walking 3-4/10 Pain location: R & midline low back Pain description: aching, pulling Aggravating factors: walking, bending over Relieving factors: heat  PERTINENT HISTORY:  Aortic atherosclerosis; carotid stenosis; cervical myelopathy s/p ACDF 06/2020 and posterior cervical fusion 11/2020; peripheral neuropathy; lumbar DDD/spondylosis; lumbago with sciatica/lumbar radiculopathy; OA; B TKA; B THA; L greater trochanteric pain syndrome; L RCR 2014; recent CTR surgery - early May; hearing loss; HTN; BPPV/vertigo; B sacroiliitis; skin cancer; COPD/emphysema; sleep apnea with CPAP; GERD; gout; HLD; LE cramps; RLS  PRECAUTIONS: None  WEIGHT BEARING RESTRICTIONS: No  FALLS: Has patient fallen in last 6 months? Yes. 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 No acute intracranial abnormality.  2. No acute fracture or traumatic subluxation of the cervical spine. L wrist x-ray: 1. Mild dorsal wrist soft tissue swelling with small ossific density overlying the proximal carpal row. Correlate for triquetral fracture.  2. Moderate scaphotrapeziotrapezoid and mild first CMC joint osteoarthritis.  L elbow x-ray: 1. No acute fracture or dislocation, left elbow.  2. Mild elevation of the anterior fat pad suggestive of a small joint effusion, which may be reactive in the setting of elbow osteoarthritis. L shoulder x-rays: No acute osseous abnormality.  COGNITION: Overall cognitive status: Within functional limits for tasks assessed and Impaired: Memory: Impaired: Short term Procedural   SENSATION: Peripheral neuropathy in B feet  COORDINATION: Mildly impaired with slow movements  EDEMA:  Mild to moderate edema in distal B LE  MUSCLE LENGTH: Hamstrings: mod tight R>L ITB: mod tight B Piriformis: mod tight L>R Hip flexors: mod tight B Quads: mod tight B Heelcord: mod tight B  POSTURE:  rounded shoulders, forward head, and flexed trunk   LOWER EXTREMITY MMT:   Tested in sitting on eval 02/22/22, 03/22/22, 04/17/22 & 05/04/22 MMT Right 02/22/22 Left 02/22/22 Right 03/22/22 Left 03/22/22 Right 04/17/22 Left  04/17/22 Right  05/04/22 Left  05/04/22 Right  06/06/22 Left  06/06/22  Hip flexion 3+ 4- 3+ 4- 4- 4- 3+ 3+ 4- 4  Hip extension 3- 3- 3+ 3+ 3+ 3+ 3 3 3+ 4-  Hip abduction 4- 4 4 4+ 4 4+ 4- 4 4- 4  Hip adduction '4 4 4 ' 4+ 4+ 4+ '4 4 4 ' 4+  Hip internal rotation 3 3+ 3 3+ 3 3+ 3- 3 3+ 4-  Hip external rotation 3+ 3+ 3+ 3+ 3+ 3+ 3 3+ 3+ 4-  Knee flexion 4- 4- 4 4 4+ 4+ 4- 4- 4 4+  Knee extension 4- 4 4 4+ 4+ 4+ 4 4 4+ 4+  Ankle dorsiflexion 4- 4 4- 4 4 4- 4- 4- 4- 4  Ankle plantarflexion 3+  6 SLS HR 4 10 SLS HR 4+ 20 SLS HR Limited lift on last few reps 5 20 SLS HR 5- 18 SLS HR 5 20 SLS HR 4- 7 SLS HR Pt  noting increased proximal LE pain 4+ 15 SLS HR 5- 20 SLS HR Limited lift on last few reps 5 20 SLS HR  Ankle inversion 3+ 4- 3+ 4- 4- 4- 4- 4- 4- 4  Ankle eversion 3+ 4- 3+ 4- 4- 4- 3+ 3+ 4- 4  (Blank rows = not tested)  BED MOBILITY:  Sit to supine SBA Supine to sit SBA  TRANSFERS: Assistive device utilized: Single point cane and None  Sit to stand: Modified independence Stand to sit: Modified independence Chair to chair: Modified independence Floor:  NT  GAIT: Gait pattern: step through pattern, decreased stride length, decreased hip/knee flexion- Right, decreased hip/knee flexion- Left, decreased trunk rotation, and wide BOS Distance walked: 80 Assistive device utilized: Single point cane Level of assistance: SBA Comments: Gait speed: 2.10 ft/sec with SPC, 2.31 ft/sec w/o AD  RAMP: Level of Assistance:  NT Assistive device utilized:    Ramp Comments:   CURB:  Level of Assistance:  NT Assistive device utilized:    Curb Comments:   STAIRS:  Level of Assistance:  NT  Stair Negotiation Technique:   Number of Stairs:    Height of Stairs:   Comments:   FUNCTIONAL TESTS:  5 times sit to stand: 13.0 sec Timed up and go (TUG): 13.53 sec with SPC; 14.19 sec w/o AD 10 meter walk test: 15.60 sec with SPC; 13.04 sec w/o AD Berg Balance Scale: 39/56 - scores 37-45 indicate significant risk for falls (>80%) Dynamic Gait Index: 11/24; Scores of 19 or less are predictive of falls in older community living adults  PATIENT  SURVEYS:  ABC scale 55% of self confidence; 45% impairment  TODAY'S TREATMENT:   06/15/22 THERAPEUTIC EXERCISE: to improve flexibility, strength and mobility.  Verbal and tactile cues throughout for technique. NuStep - L5 x 7 min (UE/LE) Trunk extension in standing with back of hips on counter 10 x 5" Standing TrA + RTB row 10 x 5" Standing TrA + RTB scap retraction and shoulder extension to neutral 10 x 5"  GAIT TRAINING: To improve safety with SPC on  all surfaces . 1000 ft with SPC over various outdoor surfaces including cement sidewalks, paved, gravel, stones and grass - no instability or LOB but increased fatigue/effort reported on uneven surfaces due to feeling of having to pick feet up higher  NEUROMUSCULAR RE-EDUCATION: To improve balance, coordination, and reduce fall risk. Stepping over sequential obstacles (yoga blocks) - initially with UE support on counter, progressing to [facility]   06/13/22 Therapeutic Exercise: Recumbent Bike L1x24mn - mildly aggravated LB Seated lumbar flexion rollout AROM green pball 10x 3 sec hold Seated deadlift x 15 Seated pallof press yellow wt ball x 10 Seated trunk rotations with yellow wt ball x 10 Standing shld ext on lat pull machine 10# x 10 Traditional lat pulls in standing 15# x 10 Seated row 20# x 10   06/08/22 THERAPEUTIC EXERCISE: to improve flexibility, strength and mobility.  Verbal and tactile cues throughout for technique. NuStep - L6 x 6 min (UE/LE) Seated 3-way lumbar flexion stretch with hands supported on SPC 2 x 30-60" each  Supine DKTC with feet resting on orange Pball 20 x 5" Supine LTR with feet resting on orange Pball 10 x 5" Bridge + RTB hip ABD isometric 10 x 5" - L HS cramp on 1st rep (relieved with manual TPR and stretching) - cues to avoid digging in with heels to reduce risk for cramping TrA + RTB march 10 x 3", 2 sets R/L sidelying RTB clam 10 x 3" Sit <> stand + RTB hip ABD isometric x 10 - pt tending to shift more L during transition   PATIENT EDUCATION: Education details: progress with PT and ongoing PT POC Person educated: Patient Education method: Explanation Education comprehension: verbalized understanding   HOME EXERCISE PROGRAM: Access Code: [email or web address]: [email or web address] Date: 03/22/2022 Prepared by: JAnnie Paras Exercises - Standing Gastroc Stretch at Counter  - 1-2 x daily - 7 x weekly - 2-3 reps - 30 sec hold - Standing Soleus  Stretch at Counter  - 1-2 x daily - 7 x weekly - 2-3 reps - 30 sec hold - Hip Flexor Stretch on Step  - 1-2 x daily - 7 x weekly - 2-3 reps - 30 sec hold - Side Stepping with Resistance at Ankles and Counter Support  - 1 x daily - 7 x weekly - 2 sets - 10 reps - Forward and Backward Step Over with Counter Support  - 1 x daily - 7 x weekly - 2 sets - 10 reps - Turning in Corner 360  - 1 x daily - 7 x weekly - 2 sets - 5 reps - Tandem Stance in Corner  - 1 x daily - 7 x weekly - 2 reps - 30 sec hold - Single Leg Stance with Support  - 1 x daily - 7 x weekly - 2 sets - 30 sec hold - Squat with Chair Touch  - 1 x daily - 7 x weekly - 2 sets - 10 reps - 3 sec hold - Seated Hamstring Stretch  -  1-2 x daily - 7 x weekly - 2 sets - 30 sec hold - Seated Hip Adductor Stretch  - 1-2 x daily - 7 x weekly - 3 reps - 30 sec hold - Seated Figure 4 Piriformis Stretch  - 1-2 x daily - 7 x weekly - 2 sets - 30 sec hold - Supine Piriformis Stretch with Foot on Ground  - 1-2 x daily - 7 x weekly - 2-3 reps - 30 sec hold - Supine Lower Trunk Rotation  - 1 x daily - 7 x weekly - 2 sets - 30 sec hold - Bent Knee Fallouts with Alternating Legs  - 1 x daily - 7 x weekly - 2 sets - 10 reps - 3 sec hold - Seated Hip Abduction with Resistance  - 1 x daily - 3-4 x weekly - 2 sets - 10 reps - 3-5 sec hold - Seated Single Leg Hip Abduction with Resistance  - 1 x daily - 3-4 x weekly - 2 sets - 10 reps - 3 sec hold - Seated March with Resistance  - 1 x daily - 3-4 x weekly - 2 sets - 10 reps - 3 sec hold - Seated Shoulder Row with Anchored Resistance  - 1 x daily - 3-4 x weekly - 2 sets - 10 reps - 3-5 hold hold  Patient Education - Check for Safety   ASSESSMENT:  CLINICAL IMPRESSION: Jamis "[name]" continues to report intermittent LBP, mostly with walking. We reviewed stretches/exercises to reduce LBP and progressed postural/lumbopelvic strengthening to include standing RTB rows/retraction with good tolerance. Took  gait training outside to work on uneven surfaces with no significant instability noted across all surfaces but increased fatigue/effort noted when walking through grass due to feeling of needing to pick his feet up higher. Returned to clinic and continued focus on foot clearance and balance with stepping over obstacles. [name] is due for a progress note next visit and will reassess goals to determine focus for remaining visits in his POC.   OBJECTIVE IMPAIRMENTS Abnormal gait, decreased activity tolerance, decreased balance, decreased endurance, decreased knowledge of condition, decreased knowledge of use of DME, decreased mobility, difficulty walking, decreased ROM, decreased strength, decreased safety awareness, increased fascial restrictions, impaired perceived functional ability, increased muscle spasms, impaired flexibility, improper body mechanics, postural dysfunction, and pain.   ACTIVITY LIMITATIONS carrying, lifting, bending, standing, squatting, stairs, transfers, and locomotion level  PARTICIPATION LIMITATIONS: cleaning, laundry, interpersonal relationship, community activity, and yard work  PERSONAL FACTORS Age, Fitness, Past/current experiences, Time since onset of injury/illness/exacerbation, and 3+ comorbidities: Aortic atherosclerosis; carotid stenosis; cervical myelopathy s/p ACDF 06/2020 and posterior cervical fusion 11/2020; peripheral neuropathy; lumbar DDD/spondylosis; lumbago with sciatica/lumbar radiculopathy; OA; B TKA; BTHA; L greater trochanteric pain syndrome; L RCR 2014; recent CTR surgery - early May; hearing loss; HTN; BPPV/vertigo; B sacroiliitis; skin cancer; COPD/emphysema; sleep apnea with CPAP; GERD; gout; HLD; LE cramps; RLS  are also affecting patient's functional outcome.   REHAB POTENTIAL: Good  CLINICAL DECISION MAKING: Evolving/moderate complexity  EVALUATION COMPLEXITY: Moderate  GOALS: Goals reviewed with patient? Yes  SHORT TERM GOALS: Target date:  03/22/2022, extended to 06/01/22   Patient will be independent with initial HEP. Baseline:  Goal status: MET  03/22/22  2.  Patient will demonstrate decreased fall risk by scoring > 45/56 on Berg. Baseline: 39/56 (eval - 02/22/22); 43/56 (03/22/22); 48/56 (04/17/22); decreased to 40/56 (05/04/22) following hospitalization; 43/56 (06/06/22) Goal status: IN PROGRESS  06/06/22 - Increased to 43/56  3.  Patient  will be educated on strategies to decrease risk of falls.  Baseline:  Goal status: MET  03/22/22 - Education provided last visit - Pt reports no concerns as he went through the home checklist other than the bathroom mat non-skid backing has disintegrated from repeated washing (he states he pushes it to the side while he is in the BR)   LONG TERM GOALS: Target date: 04/19/2022, extended to 05/29/2022, then 06/29/22   Patient will be independent with advanced/ongoing HEP to improve outcomes and carryover.  Baseline:  Goal status: IN PROGRESS  05/29/22 - Pt instructed to rotate through HEP exercise in smaller groups to reduce fatigue and allow for recovery time.  2.  Patient will be able to ambulate 600' with LRAD with good safety to access community.  Baseline:  Goal status: IN PROGRESS  3.  Patient will be able to step up/down curb safely with LRAD for safety with community ambulation.  Baseline:  Goal status: MET  04/17/22  4.  Patient will demonstrate improved functional LE strength as demonstrated by overall MMT >/= 4 to 4+/5. Baseline:  Goal status: IN PROGRESS  06/06/22 - Slowly improving but R weaker than L (refer to MMT chart)  5.  Patient will demonstrate at least 19/24 on DGI to improve gait stability and reduce risk for falls. Baseline: 11/24 (eval); 16/24 (03/31/22); 18/24 (04/17/22); 12/24 (05/04/22); 14/24 (06/06/22) Goal status: IN PROGRESS  9/26//23 - Increased to 14/24   6.  Patient will score >/= 52/56 on Berg Balance test to demonstrate lower risk of falls. (MCID= 8 points) .   Baseline: 39/56 (eval - 02/22/22); 43/56 (03/22/22); 48/56 (04/17/22); 40/56 (05/04/22); 43/56 (06/06/22) Goal status: IN PROGRESS  06/06/22 - Increased to 43/56  7.  Patient will report >67% of self confidence on ABC scale to demonstrate improved functional ability. Baseline: 55% self confidence (eval); 53.8 % (04/17/22) Goal status: IN PROGRESS  8.  Patient will demonstrate gait speed of >/= 2.62 ft/sec (0.80 m/s) to be a safe community ambulator with decreased risk for recurrent falls.  Baseline: 2.10 ft/sec with SPC, 2.31 ft/sec w/o AD (eval); 2.90 ft/sec with SPC; 2.92 ft/sec w/o AD (03/31/22); 2.99 ft/sec with SPC; 3.14 ft/sec w/o AD (04/17/22); 2.77 ft/sec with SPC; 3.09 ft/sec w/o AD but much more unsteady (05/04/22);  2.71 ft/sec with SPC; 3.16 ft/sec w/o AD (06/06/22) Goal status: MET  03/31/22   PLAN: PT FREQUENCY: 2x/week  PT DURATION: 7-8 weeks  PLANNED INTERVENTIONS: Therapeutic exercises, Therapeutic activity, Neuromuscular re-education, Balance training, Gait training, Patient/Family education, Joint mobilization, Stair training, Vestibular training, Canalith repositioning, Dry Needling, Electrical stimulation, Spinal manipulation, Cryotherapy, Moist heat, Taping, Ultrasound, Ionotophoresis 74m/ml Dexamethasone, Manual therapy, and Re-evaluation  PLAN FOR NEXT SESSION:  MD PN; progress lumbopelvic/LE flexibility and strengthening; balance training with close guarding; HEP review/update/modification as needed   JPercival Spanish PT 06/15/2022, 12:34 PM

## 2022-06-16 ENCOUNTER — Encounter: Payer: Self-pay | Admitting: Gastroenterology

## 2022-06-16 ENCOUNTER — Ambulatory Visit (INDEPENDENT_AMBULATORY_CARE_PROVIDER_SITE_OTHER): Payer: Medicare HMO | Admitting: Gastroenterology

## 2022-06-16 VITALS — BP 126/72 | HR 62 | Ht 67.0 in | Wt 153.0 lb

## 2022-06-16 DIAGNOSIS — Z86010 Personal history of colonic polyps: Secondary | ICD-10-CM | POA: Diagnosis not present

## 2022-06-16 DIAGNOSIS — K5909 Other constipation: Secondary | ICD-10-CM

## 2022-06-16 DIAGNOSIS — Z8719 Personal history of other diseases of the digestive system: Secondary | ICD-10-CM

## 2022-06-16 DIAGNOSIS — K5790 Diverticulosis of intestine, part unspecified, without perforation or abscess without bleeding: Secondary | ICD-10-CM

## 2022-06-16 NOTE — Patient Instructions (Signed)
If you are age 82 or older, your body mass index should be between 23-30. Your Body mass index is 23.96 kg/m. If this is out of the aforementioned range listed, please consider follow up with your Primary Care Provider.  If you are age 90 or younger, your body mass index should be between 19-25. Your Body mass index is 23.96 kg/m. If this is out of the aformentioned range listed, please consider follow up with your Primary Care Provider.   __________________________________________________________  The [facility] providers would like to encourage you to use [facility] to communicate with providers for non-urgent requests or questions.  Due to long hold times on the telephone, sending your provider a message by [facility] may be a faster and more efficient way to get a response.  Please allow 48 business hours for a response.  Please remember that this is for non-urgent requests.   Due to recent changes in healthcare laws, you may see the results of your imaging and laboratory studies on MyChart before your provider has had a chance to review them.  We understand that in some cases there may be results that are confusing or concerning to you. Not all laboratory results come back in the same time frame and the provider may be waiting for multiple results in order to interpret others.  Please give [place] 48 hours in order for your provider to thoroughly review all the results before contacting the office for clarification of your results.   Follow up as needed.   Thank you for choosing me and [facility].  Vito Cirigliano, D.O.

## 2022-06-16 NOTE — Progress Notes (Signed)
Chief Complaint:    Hospital follow-up for hematochezia  GI History: 81 year old male with medical history as outlined below, to include history of OSA on CPAP, hypothyroidism, HTN, GERD, chronic constipation, hemorrhoids, diverticulosis, HHR 1985, ACDF 2021, ccy, follows in the GI clinic for the following:  Hospital admission 8/11-17, 2023 for rectal bleeding,ABLA, hemorrhagic shock, AKI, aspiration pneumonia.  Admission Hgb 10 with nadir 6.5 (baseline ~12) - CT angio x2 without acute bleeding - 04/24/2022: Colonoscopy: Pandiverticulosis, clotted blood without acute bleeding site - 04/26/2022: Colonoscopy: Pandiverticulosis, 4 polyps resected (all adenomas).  No active bleeding - Received 5 unit PRBCs with discharge hemoglobin 9.5 - Outpatient Hgb uptrending 11.1 --> 12.3  HPI:     Patient is a 81 y.o. male presenting to the Gastroenterology Clinic for hospital follow-up.  Was admitted in August with presumed diverticular bleed requiring 4 units PRBCs and colonoscopy x2 as outlined above.  Since hospital discharge, no recurrence of bleeding.  Currently taking ASA 81 mg/day.  Good PO intake. No abdominal pain. Feels back to his usual state of health. Taking stool softener daily and has Rx for Miralax prn.   He had follow-up with his PCM on 06/13/2022.  Review of systems:     No chest pain, no SOB, no fevers, no urinary sx   Past Medical History:  Diagnosis Date   Allergic rhinitis 02/02/2016   Anemia 12/17/2009   Formatting of this note might be different from the original. 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 Hypertension   Benign paroxysmal positional vertigo 09/14/2021   Bilateral sacroiliitis  ([place]) 07/22/2021   Body mass index (BMI) 25.0-25.9, adult 11/02/2020   Cancer ([place])    skin cancer   Cardiac murmur 07/25/2021   Carotid stenosis    a. Carotid U/S 5/78: LICA < 46%, RICA 96-29%;  b.  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   Cystoid macular edema of both eyes 07/28/2020   Degenerative disc disease, lumbar 06/30/2015   Deviated nasal septum 02/18/2015   Diverticulosis 03/03/2020   DJD (degenerative joint disease) of hip 12/24/2012   Dyspnea 06/12/2011   Emphysema, unspecified ([place]) 03/05/2020   Erectile dysfunction 06/20/2017   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[place]) 02/09/2021   Gait disorder 03/02/2021   GERD (gastroesophageal reflux disease)    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   Qualifier: Diagnosis of  By: Burnett Kanaris     Greater trochanteric pain syndrome 07/22/2021   Hand pain, left 01/16/2022   Hearing loss 02/02/2016   Heme positive stool 02/29/2020   HIATAL HERNIA 05/13/2009   Qualifier: Diagnosis of  By: Burnett Kanaris     High risk medication use 01/18/2012   Hip pain 03/02/2021   History of chicken pox    History of COVID-19 12/05/2021   History of hemorrhoids    History of  kidney stones    History of skin cancer 07/22/2021   skin cancer   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 Osteoarthritis Of The Hand  10/1 IMO update   Osteoarthrosis, unspecified whether generalized or localized, pelvic region and thigh 07/25/2010   Formatting of this note might be different from the original. Osteoarthritis Of The Hip   Other abnormal glucose 07/22/2021   Other allergic rhinitis 02/18/2015   Palpitations 07/25/2021   Peripheral neuropathy 06/19/2016   Preventative health care 11/24/2015   Right groin pain 11/29/2012   Right inguinal hernia 05/13/2009   Qualifier: Diagnosis of  By: Burnett Kanaris     RLS (restless legs syndrome) 02/18/2015   Rosacea    Rotator cuff tear 07/27/2013   Situational depression 03/02/2021   Skin lesion 01/16/2022   Sleep apnea with use of continuous positive airway pressure (CPAP) 07/15/2013   CPAP set to  7 cm water,  Residual AHi was 7.8  With no leak.  User time 6 hours.  479 days , not used only 12 days - highly compliant 06-23-13  .    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   Urinary retention 11/26/2020   Ventral hernia 07/08/2012   Vertigo 07/08/2021   Weight loss 03/03/2020     Patient's surgical history, family medical history, social history, medications and allergies were all reviewed in Epic    Current Outpatient Medications  Medication Sig Dispense Refill   acetaminophen (TYLENOL) 650 MG CR tablet Take 650-1,300 mg by mouth See admin instructions. Take 1,300 mg by mouth in the morning and 650 mg at bedtime     albuterol (VENTOLIN HFA) 108 (90 Base) MCG/ACT inhaler INHALE 2 PUFFS INTO THE LUNGS EVERY 6 HOURS AS NEEDED FOR SHORTNESS OF BREATH. (Patient taking differently: Inhale 2 puffs into the lungs every 6 (six) hours as needed for shortness of breath.) 1 each 3   allopurinol (ZYLOPRIM) 100 MG tablet TAKE 1 TABLET TWICE DAILY (Patient taking differently: Take 100 mg by mouth in the morning and at bedtime.) 180 tablet 0   amLODipine (NORVASC) 2.5 MG tablet Take 1 tablet (2.5 mg total) by mouth daily. 90 tablet 0   antiseptic oral rinse (BIOTENE) LIQD 15 mLs by Mouth Rinse route in the morning.     aspirin EC 81 MG tablet Take 81 mg by mouth at bedtime.  cycloSPORINE (RESTASIS) 0.05 % ophthalmic emulsion Place 1 drop into both eyes 2 (two) times daily.     diclofenac Sodium (VOLTAREN) 1 % GEL Apply 2 g topically daily as needed (for arthritic pain in the hands).     docusate sodium (COLACE) 100 MG capsule Take 1 capsule (100 mg total) by mouth 2 (two) times daily. 60 capsule 11   fluocinonide (LIDEX) 0.05 % external solution Apply 1 application  topically 2 (two) times daily as needed for itching (SCALP).     GEMTESA 75 MG TABS Take 75 mg by mouth daily.     glucose blood test strip TRUE Metrix Blood Glucose Test Strip, use as instructed 100 each 12   Homeopathic Products (THERAWORX RELIEF) FOAM Apply 1 application topically 2 (two) times daily as needed (Pain).     hydrALAZINE (APRESOLINE) 50 MG tablet Take 1 tablet (50 mg total) by mouth 2 (two) times daily. (Patient taking differently: Take 50 mg by mouth in the morning and at bedtime.) 60 tablet 2    Lancets MISC 1 each by Does not apply route 2 (two) times daily. TRUE matrix lancets 100 each 3   levothyroxine (SYNTHROID) 150 MCG tablet Take 150 mcg 6 days a week (patient takes 75 mcg once a week) Disp #84 for 90 day supply (Patient taking differently: Take 150 mcg by mouth See admin instructions. Take 150 mcg by mouth in the morning before breakfast on Mon/Tues/Wed/Thurs/Fri/Sat) 84 tablet 3   levothyroxine (SYNTHROID) 75 MCG tablet Take 75 mcg once a week (patient on 150 mcg 6 days a week) Dispense #24 for 90 day supply. (Patient taking differently: Take 75 mcg by mouth See admin instructions. Take 75 mcg by mouth in the morning before breakfast on Sundays only) 24 tablet 3   lip balm (CARMEX) ointment Apply 1 Application topically as needed for lip care or dry skin.     loratadine (CLARITIN) 10 MG tablet Take 10 mg by mouth daily as needed for allergies or rhinitis.     losartan (COZAAR) 100 MG tablet TAKE 1 TABLET EVERY DAY (Patient taking differently: Take 100 mg by mouth daily after supper.) 90 tablet 1   Magnesium 500 MG TABS Take 500 mg by mouth daily.     multivitamin (THERAGRAN) per tablet Take 1 tablet by mouth daily with breakfast.     nitroGLYCERIN (NITROSTAT) 0.4 MG SL tablet Place 1 tablet (0.4 mg total) under the tongue every 5 (five) minutes as needed for chest pain. 25 tablet 6   NON FORMULARY Prebiotic's 1 a day     NON FORMULARY Probiatics 1 a day     NON FORMULARY Stool softener 1 a day     ondansetron (ZOFRAN-ODT) 4 MG disintegrating tablet Take 1 tablet (4 mg total) by mouth every 8 (eight) hours as needed for nausea or vomiting. (Patient taking differently: Take 4 mg by mouth every 8 (eight) hours as needed for nausea or vomiting (dissolve orally).) 10 tablet 0   polyethylene glycol (MIRALAX / GLYCOLAX) 17 g packet Take 17-34 g by mouth daily as needed for constipation (mix and drink).     PRESCRIPTION MEDICATION CPAP- At bedtime and during any time of rest      rosuvastatin (CRESTOR) 5 MG tablet TAKE 1 TABLET EVERY DAY (Patient taking differently: Take 5 mg by mouth daily.) 90 tablet 1   sodium chloride (OCEAN) 0.65 % nasal spray Place 1 spray into both nostrils at bedtime.     traMADol (ULTRAM) 50  MG tablet Take 1 tablet (50 mg total) by mouth every 12 (twelve) hours as needed. 10 tablet 0   vitamin B-12 (CYANOCOBALAMIN) 500 MCG tablet Take 500 mcg by mouth daily.     No current facility-administered medications for this visit.    Physical Exam:     BP 126/72   Pulse 62   Ht '5\' 7"'$  (1.702 m)   Wt 153 lb (69.4 kg)   BMI 23.96 kg/m   GENERAL:  Pleasant male in NAD PSYCH: : Cooperative, normal affect CARDIAC:  RRR, no murmur heard, no peripheral edema PULM: Normal respiratory effort, lungs CTA bilaterally, no wheezing ABDOMEN:  Nondistended, soft, nontender. No obvious masses, no hepatomegaly,  normal bowel sounds SKIN:  turgor, no lesions seen Musculoskeletal:  Normal muscle tone, normal strength NEURO: Alert and oriented x 3, no focal neurologic deficits   IMPRESSION and PLAN:    1) Diverticular bleed 2) Acute blood loss [facility] admission for hematochezia, ABLA,, presumably from acute diverticular bleed.  Negative work-up to include colonoscopy x2, CTA x2.  Good serologic response to PRBC transfusion.  Outpatient H/H continues to uptrend, now back to baseline.  No recurrence of bleeding.  - Monitor for evidence of rebleeding.  If rebleeding should occur, to call the office or go directly to ER for CTA  3) Chronic constipation - Relatively well controlled with current regimen - Continue Colace - Try using MiraLAX triweekly and can titrate up/down as needed to keep stools soft  4) History of colon polyps - Colonoscopy with several subcentimeter tubular adenomas -No repeat colonoscopy for surveillance purposes needed based on age, comorbidities, recent findings.  RTC prn  I spent 30 minutes of time, including in  depth chart review, independent review of results as outlined above, communicating results with the patient directly, face-to-face time with the patient, coordinating care, and ordering studies and medications as appropriate, and documentation.          Lavena Bullion ,DO, FACG 06/16/2022, 1:36 PM

## 2022-06-19 ENCOUNTER — Telehealth: Payer: Self-pay | Admitting: *Deleted

## 2022-06-19 ENCOUNTER — Other Ambulatory Visit: Payer: Self-pay | Admitting: Neurosurgery

## 2022-06-19 NOTE — Telephone Encounter (Signed)
Pt has been scheduled for a tele visit, 06/28/22 9;20.  Consent on file / medications reconciled.     Patient Consent for Virtual Visit        [name] Pratt has provided verbal consent on 06/19/2022 for a virtual visit (video or telephone).   CONSENT FOR VIRTUAL VISIT FOR:  [name] Pratt  By participating in this virtual visit I agree to the following:  I hereby voluntarily request, consent and authorize [facility] and its employed or contracted physicians, physician assistants, nurse practitioners or other licensed health care professionals (the Practitioner), to provide me with telemedicine health care services (the "Services") as deemed necessary by the treating Practitioner. I acknowledge and consent to receive the Services by the Practitioner via telemedicine. I understand that the telemedicine visit will involve communicating with the Practitioner through live audiovisual communication technology and the disclosure of certain medical information by electronic transmission. I acknowledge that I have been given the opportunity to request an in-person assessment or other available alternative prior to the telemedicine visit and am voluntarily participating in the telemedicine visit.  I understand that I have the right to withhold or withdraw my consent to the use of telemedicine in the course of my care at any time, without affecting my right to future care or treatment, and that the Practitioner or I may terminate the telemedicine visit at any time. I understand that I have the right to inspect all information obtained and/or recorded in the course of the telemedicine visit and may receive copies of available information for a reasonable fee.  I understand that some of the potential risks of receiving the Services via telemedicine include:  Delay or interruption in medical evaluation due to technological equipment failure or disruption; Information transmitted may not be  sufficient (e.g. poor resolution of images) to allow for appropriate medical decision making by the Practitioner; and/or  In rare instances, security protocols could fail, causing a breach of personal health information.  Furthermore, I acknowledge that it is my responsibility to provide information about my medical history, conditions and care that is complete and accurate to the best of my ability. I acknowledge that Practitioner's advice, recommendations, and/or decision may be based on factors not within their control, such as incomplete or inaccurate data provided by me or distortions of diagnostic images or specimens that may result from electronic transmissions. I understand that the practice of medicine is not an exact science and that Practitioner makes no warranties or guarantees regarding treatment outcomes. I acknowledge that a copy of this consent can be made available to me via my patient portal ([place] [facility]), or I can request a printed copy by calling the office of [facility].    I understand that my insurance will be billed for this visit.   I have read or had this consent read to me. I understand the contents of this consent, which adequately explains the benefits and risks of the Services being provided via telemedicine.  I have been provided ample opportunity to ask questions regarding this consent and the Services and have had my questions answered to my satisfaction. I give my informed consent for the services to be provided through the use of telemedicine in my medical care

## 2022-06-19 NOTE — Telephone Encounter (Signed)
Pt is returning call.  

## 2022-06-19 NOTE — Telephone Encounter (Signed)
1st attempt to reach pt regarding surgical clearance and the need for a tele visit.  Left a message for pt to call back and ask for the preop team. 

## 2022-06-19 NOTE — Telephone Encounter (Signed)
Primary Cardiologist:Peter Johnsie Cancel, MD   Preoperative team, please contact this patient and set up a phone call appointment for further preoperative risk assessment. He has an appointment with Dr. Johnsie Cancel on 11/7, however procedure is scheduled for 11/1. Please obtain consent and complete medication review. Thank you for your help.    I confirm that guidance regarding antiplatelet and oral anticoagulation therapy has been completed and, if necessary, noted below (none requested). If aspirin should be requested to be held, he may hold aspirin for 5-7 days prior to procedure and resume as soon as possible post-procedure.     Emmaline Life, NP-C  06/19/2022, 2:18 PM [address] Office [phone] Fax [phone]

## 2022-06-19 NOTE — Telephone Encounter (Signed)
Pt has been scheduled for a tele visit, 06/28/22 9;20.  Consent on file / medications reconciled.

## 2022-06-19 NOTE — Telephone Encounter (Signed)
   Pre-operative Risk Assessment    Patient Name: [name] Pratt  DOB: 1941-02-08 MRN: [id number]      Request for Surgical Clearance    Procedure:   L2-3, L3-4 LUMBAR LAMINECCTOMY  Date of Surgery:  Clearance 07/12/22                                 Surgeon:  Elaina Hoops Surgeon's Group or Practice Name:  [facility] Phone number:  [phone] Fax number:  [phone]   Type of Clearance Requested:   - Medical    Type of Anesthesia:  General    Additional requests/questions:    SignedJeanann Lewandowsky   06/19/2022, 1:40 PM

## 2022-06-20 ENCOUNTER — Encounter: Payer: Self-pay | Admitting: Physical Therapy

## 2022-06-20 ENCOUNTER — Ambulatory Visit: Payer: Medicare HMO | Admitting: Physical Therapy

## 2022-06-20 DIAGNOSIS — R2689 Other abnormalities of gait and mobility: Secondary | ICD-10-CM

## 2022-06-20 DIAGNOSIS — R2681 Unsteadiness on feet: Secondary | ICD-10-CM

## 2022-06-20 DIAGNOSIS — R296 Repeated falls: Secondary | ICD-10-CM

## 2022-06-20 DIAGNOSIS — M6281 Muscle weakness (generalized): Secondary | ICD-10-CM | POA: Diagnosis not present

## 2022-06-20 DIAGNOSIS — M5459 Other low back pain: Secondary | ICD-10-CM | POA: Diagnosis not present

## 2022-06-20 NOTE — Therapy (Signed)
OUTPATIENT PHYSICAL THERAPY  TREATMENT / PROGRESS NOTE   Patient Name: [name] Pratt MRN: [id number] DOB:16-Aug-1941, 81 y.o., male Today's Date: 06/20/2022  Progress Note  Reporting Period 05/04/2022 to 06/20/2022  See note below for Objective Data and Assessment of Progress/Goals.     PT End of Session - 06/20/22 1014     Visit Number 23    Number of Visits 27    Date for PT Re-Evaluation 07/10/22    Authorization Type [name]    Authorization Time Period 02/24/22 - 07/18/22    Authorization - Visit Number 51    Authorization - Number of Visits 26    PT Start Time 1014    PT Stop Time 1100    PT Time Calculation (min) 46 min    Activity Tolerance Patient tolerated treatment well    Behavior During Therapy [facility] for tasks assessed/performed                          Past Medical History:  Diagnosis Date   Allergic rhinitis 02/02/2016   Anemia 12/17/2009   Formatting of this note might be different from the original. Anemia  10/1 IMO update   Aortic atherosclerosis ([place]) 03/05/2020   Arthritis    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   Atypical chest pain 11/24/2015   Benign essential hypertension 05/13/2009   Qualifier: Diagnosis of  By: Larwance Sachs of this note might be different from the original. Hypertension   Benign paroxysmal positional vertigo 09/14/2021   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[place]) 07/22/2021   Body mass index (BMI) 25.0-25.9, adult 11/02/2020   Cancer ([place])    skin cancer   Cardiac murmur 07/25/2021   Carotid stenosis    a. Carotid U/S 7/35: LICA < 32%, RICA 99-24%;  b.  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   Cystoid macular edema of both eyes 07/28/2020   Degenerative disc disease, lumbar 06/30/2015   Deviated nasal septum 02/18/2015   Diverticulosis 03/03/2020   DJD (degenerative joint disease) of hip 12/24/2012   Dyspnea 06/12/2011   Emphysema, unspecified ([place]) 03/05/2020   Erectile dysfunction 06/20/2017   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[place]) 02/09/2021   Gait disorder 03/02/2021   GERD (gastroesophageal reflux disease)    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   Qualifier: Diagnosis of  By: Burnett Kanaris     Greater trochanteric pain syndrome 07/22/2021   Hand pain, left 01/16/2022   Hearing loss 02/02/2016   Heme positive stool 02/29/2020   HIATAL HERNIA 05/13/2009   Qualifier: Diagnosis of  By: Burnett Kanaris     High risk medication use 01/18/2012   Hip pain 03/02/2021   History of chicken pox    History of COVID-19 12/05/2021   History of hemorrhoids    History of kidney stones    History of skin cancer 07/22/2021   skin cancer   History of total bilateral knee replacement 07/08/2020   Hyperglycemia 03/03/2014   Hyperlipidemia with target LDL less than 130 12/27/2010   Formatting of this note might be different from  the original. Cardiology - Dr Frances Nickels at [facility] cardiology  ICD-10 cut over   Hypertension    under control   Hypokalemia 07/08/2021   Hypothyroidism    Internal hemorrhoids 03/03/2020   Leg cramps 11/13/2019   Lower GI bleed 12/12/2012   Lumbago of lumbar region with sciatica 10/21/2020   Lumbar radiculopathy 03/02/2021   Lumbar spondylosis 11/02/2020   NEPHROLITHIASIS 05/13/2009   Qualifier: Diagnosis of  By: Burnett Kanaris     Nonspecific abnormal electrocardiogram (ECG) (EKG) 01/06/2013   Numbness of hand 01/24/2022   Onychomycosis 06/30/2015   Osteoarthrosis, hand 06/13/2010   Formatting of this note might be different from  the original. Osteoarthritis Of The Hand  10/1 IMO update   Osteoarthrosis, unspecified whether generalized or localized, pelvic region and thigh 07/25/2010   Formatting of this note might be different from the original. Osteoarthritis Of The Hip   Other abnormal glucose 07/22/2021   Other allergic rhinitis 02/18/2015   Palpitations 07/25/2021   Peripheral neuropathy 06/19/2016   Preventative health care 11/24/2015   Right groin pain 11/29/2012   Right inguinal hernia 05/13/2009   Qualifier: Diagnosis of  By: Burnett Kanaris     RLS (restless legs syndrome) 02/18/2015   Rosacea    Rotator cuff tear 07/27/2013   Situational depression 03/02/2021   Skin lesion 01/16/2022   Sleep apnea with use of continuous positive airway pressure (CPAP) 07/15/2013   CPAP set to  7 cm water,  Residual AHi was 7.8  With no leak.  User time 6 hours.  479 days , not used only 12 days - highly compliant 06-23-13  .    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   Urinary retention 11/26/2020   Ventral hernia 07/08/2012   Vertigo 07/08/2021   Weight loss 03/03/2020   Past Surgical History:  Procedure Laterality Date   ABDOMINAL HERNIA REPAIR  07/15/12   Dr [name]   ANTERIOR CERVICAL DECOMP/DISCECTOMY FUSION N/A 07/09/2020   Procedure: ANTERIOR CERVICAL DECOMPRESSION AND FUSION CERVICAL THREE-FOUR.;  Surgeon: [name], MD;  Location: [place];  Service: Neurosurgery;  Laterality: N/A;  anterior   BROW LIFT  05/07/01   COLONOSCOPY WITH PROPOFOL N/A 04/24/2022   Procedure: COLONOSCOPY WITH PROPOFOL;  Surgeon: Thornton Park, MD;  Location: [place];  Service: Gastroenterology;   Laterality: N/A;   COLONOSCOPY WITH PROPOFOL N/A 04/26/2022   Procedure: COLONOSCOPY WITH PROPOFOL;  Surgeon: Thornton Park, MD;  Location: [place];  Service: Gastroenterology;  Laterality: N/A;   CYSTOSCOPY WITH URETEROSCOPY AND STENT PLACEMENT Right 01/28/2014   Procedure: CYSTOSCOPY WITH URETEROSCOPY, BASKET RETRIVAL AND  STENT PLACEMENT;  Surgeon: [name], MD;  Location: [facility];  Service: Urology;  Laterality: Right;   epidural injections     multiple procedures   ESOPHAGEAL DILATION  1993 and 1994   multiple times   EYE SURGERY Bilateral 03/25/10, 2012   cataract removal   HEMORRHOID SURGERY  2014   HIATAL HERNIA REPAIR  12/21/93   HOLMIUM LASER APPLICATION Right 0/93/2355   Procedure: HOLMIUM LASER APPLICATION;  Surgeon: [name], MD;  Location: [facility];  Service: Urology;  Laterality: Right;   INGUINAL HERNIA REPAIR Right 07/15/12  Dr [name], x2   JOINT REPLACEMENT  07/25/04   right knee   JOINT REPLACEMENT  07/13/10   left knee   [place]  09/20/06   with intraoperative cholangiogram and right inguinal herniorrhaphy with mesh    LIGAMENT REPAIR Left 07/2013   shoulder   LITHOTRIPSY     x2   [place]   POLYPECTOMY  04/26/2022   Procedure: POLYPECTOMY;  Surgeon: Thornton Park, MD;  Location: [place];  Service: Gastroenterology;;   POSTERIOR CERVICAL FUSION/FORAMINOTOMY N/A 11/24/2020   Procedure: Posterior Cervical Laminectomy Cervical three-four, Cervical four-five with lateral mass fusion;  Surgeon: [name], MD;  Location: [place];  Service: Neurosurgery;  Laterality: N/A;   REFRACTIVE SURGERY Bilateral    Right total hip replacement  4/14   SKIN CANCER DESTRUCTION     nose, ear   TONSILLECTOMY  as child   TOTAL HIP ARTHROPLASTY Left    URETHRAL DILATION  1991   [name]   Patient Active Problem List   Diagnosis Date Noted   Hand weakness 05/02/2022   Acute kidney injury ([place])  05/02/2022   Adenomatous polyp of ascending colon    Adenomatous polyp of colon    Acute metabolic encephalopathy 40/98/1191   GI bleed 04/21/2022   Vasovagal syncope    Hemorrhagic shock (HCC)    Symptomatic anemia    Hematochezia    Acute blood loss anemia    Irregular heart beat 02/03/2022   Constipation 02/03/2022   Carpal tunnel syndrome 01/24/2022   Numbness of hand 01/24/2022   Ulnar neuropathy 01/24/2022   Skin lesion 01/16/2022   History of COVID-19 12/05/2021   Benign paroxysmal positional vertigo 09/14/2021   Palpitations 07/25/2021   Cardiac murmur 07/25/2021   History of chicken pox 07/22/2021   History of hemorrhoids 07/22/2021   Hypertension 07/22/2021   COPD (chronic obstructive pulmonary disease) ([place]) 07/22/2021   History of kidney stones 07/22/2021   History of skin cancer 47/82/9562   Complication of anesthesia 07/22/2021   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[place]) 07/22/2021   Other abnormal glucose 07/22/2021   Spondylitis ([place]) 07/22/2021   Greater trochanteric pain syndrome 07/22/2021   Vertigo 07/08/2021   Hypokalemia 07/08/2021   Trochanteric bursitis of left hip 06/30/2021   Cancer ([place]) 04/11/2021   Lumbar radiculopathy 03/02/2021   Gait instability 03/02/2021   Hip pain 03/02/2021   Situational depression 03/02/2021   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[place]) 02/09/2021   Urinary retention 11/26/2020   Cervical myelopathy ([place]) 11/24/2020   Body mass index (BMI) 25.0-25.9, adult 11/02/2020   Lumbar spondylosis 11/02/2020   Lumbago of lumbar region with sciatica 10/21/2020   Cystoid macular edema of both eyes 07/28/2020   Status post cervical spinal fusion 07/09/2020   History of total bilateral knee replacement 07/08/2020   Aortic atherosclerosis ([place]) 03/05/2020   Emphysema, unspecified ([place]) 03/05/2020   Arthritis 03/04/2020   Diverticulosis 03/03/2020   Internal hemorrhoids 03/03/2020    Weight loss 03/03/2020   Heme positive stool 02/29/2020   Leg cramps 11/13/2019   Tibialis anterior tendon tear, nontraumatic 11/13/2019   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 07/29/2018   CPAP (continuous positive airway pressure) dependence 03/25/2018   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   Erectile dysfunction 06/20/2017   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 03/06/2017   Peripheral neuropathy 06/19/2016   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  Hearing loss 02/02/2016   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   Allergic rhinitis 02/02/2016   Atypical chest pain 11/24/2015   Preventative health care 11/24/2015   Onychomycosis 06/30/2015   Degenerative disc disease, lumbar 06/30/2015   Other allergic rhinitis 02/18/2015   Deviated nasal septum 02/18/2015   RLS (restless legs syndrome) 02/18/2015   GERD (gastroesophageal reflux disease) 09/18/2014   Hyperglycemia 03/03/2014   Rotator cuff tear 07/27/2013   Sleep apnea with use of continuous positive airway pressure (CPAP) 07/15/2013   Carotid stenosis 02/24/2013   Nonspecific abnormal electrocardiogram (ECG) (EKG) 01/06/2013   DJD (degenerative joint disease) of hip 12/24/2012   Lower GI bleed 12/12/2012   Right groin pain 11/29/2012   OSA (obstructive sleep apnea) 11/25/2012   Ventral hernia 07/08/2012   High risk medication use 01/18/2012   Dyspnea 06/12/2011   Hyperlipidemia with target LDL less than 130 12/27/2010   Osteoarthrosis, unspecified whether generalized or localized, pelvic region and thigh 07/25/2010   Osteoarthrosis, hand 06/13/2010   Anemia 12/17/2009   Hypothyroidism 05/13/2009   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   Benign essential hypertension 05/13/2009   Right inguinal hernia 05/13/2009   HIATAL HERNIA 05/13/2009   NEPHROLITHIASIS 05/13/2009   ROSACEA 05/13/2009    PCP: [name], NP  REFERRING PROVIDER: [name], NP  REFERRING DIAG: R26.81  (ICD-10-CM) - Gait instability   THERAPY DIAG:  Unsteadiness on feet  Other abnormalities of gait and mobility  Muscle weakness (generalized)  Repeated falls  Other low back pain  RATIONALE FOR EVALUATION AND TREATMENT:  Rehabilitation  ONSET DATE:  Fall 01/12/22 (2 falls in past 6 months)  SUBJECTIVE:   SUBJECTIVE STATEMENT: Pt reports [name] has scheduled his back surgery for 07/12/22.   PAIN:  Are you having pain? Yes: NPRS scale: intermittent 2/10 Pain location: R & midline low back Pain description: aching, pulling Aggravating factors: walking, bending over Relieving factors: heat  PERTINENT HISTORY:  Aortic atherosclerosis; carotid stenosis; cervical myelopathy s/p ACDF 06/2020 and posterior cervical fusion 11/2020; peripheral neuropathy; lumbar DDD/spondylosis; lumbago with sciatica/lumbar radiculopathy; OA; B TKA; B THA; L greater trochanteric pain syndrome; L RCR 2014; recent CTR surgery - early May; hearing loss; HTN; BPPV/vertigo; B sacroiliitis; skin cancer; COPD/emphysema; sleep apnea with CPAP; GERD; gout; HLD; LE cramps; RLS  PRECAUTIONS: None  WEIGHT BEARING RESTRICTIONS: No  FALLS: Has patient fallen in last 6 months? Yes. 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 No acute intracranial abnormality.  2. No acute fracture or traumatic subluxation of the cervical spine. L wrist x-ray: 1. Mild dorsal wrist soft tissue swelling with small ossific density overlying the proximal carpal row. Correlate for triquetral fracture.  2. Moderate scaphotrapeziotrapezoid and mild first CMC joint osteoarthritis. L elbow x-ray: 1. No acute  fracture or dislocation, left elbow.  2. Mild elevation of the anterior fat pad suggestive of a small joint effusion, which may be reactive in the setting of elbow osteoarthritis. L shoulder x-rays: No acute osseous abnormality.  COGNITION: Overall cognitive status: Within functional limits for tasks assessed and Impaired: Memory: Impaired: Short term Procedural   SENSATION: Peripheral neuropathy in B feet  COORDINATION: Mildly impaired with slow movements  EDEMA:  Mild to moderate edema in distal B LE  MUSCLE LENGTH: Hamstrings: mod tight R>L ITB: mod tight B Piriformis: mod tight L>R Hip flexors: mod tight B Quads: mod tight B Heelcord: mod tight B  POSTURE:  rounded shoulders, forward head, and flexed trunk   LOWER EXTREMITY MMT:   Tested in sitting on eval 02/22/22, 03/22/22, 04/17/22, 05/04/22, 926/23 & 06/20/22 MMT Right 02/22/22 Left 02/22/22 Right 03/22/22 Left 03/22/22 Right 04/17/22 Left  04/17/22 Right  05/04/22 Left  05/04/22 Right  06/06/22 Left  06/06/22 Right 06/20/22 Left 06/20/22  Hip flexion 3+ 4- 3+ 4- 4- 4- 3+ 3+ 4- 4 4- 4  Hip extension 3- 3- 3+ 3+ 3+ 3+ 3 3 3+ 4- 4- 4-  Hip abduction 4- 4 4 4+ 4 4+ 4- 4 4- 4 4 4+  Hip adduction '4 4 4 ' 4+ 4+ 4+ '4 4 4 ' 4+ 4+ 4+  Hip internal rotation 3 3+ 3 3+ 3 3+ 3- 3 3+ 4- 4- 4  Hip external rotation 3+ 3+ 3+ 3+ 3+ 3+ 3 3+ 3+ 4- 3+ 4-  Knee flexion 4- 4- 4 4 4+ 4+ 4- 4- 4 4+ 4+ 4+  Knee extension 4- 4 4 4+ 4+ 4+ 4 4 4+ 4+ 4+ 4+  Ankle dorsiflexion 4- 4 4- 4 4 4- 4- 4- 4- '4 4 4  ' Ankle plantarflexion 3+  6 SLS HR 4 10 SLS HR 4+ 20 SLS  HR Limited lift on last few reps 5 20 SLS HR 5- 18 SLS HR 5 20 SLS HR 4- 7 SLS HR Pt noting increased proximal LE pain 4+ 15 SLS HR 5- 20 SLS HR Limited lift on last few reps 5 20 SLS HR 5- 5  Ankle inversion 3+ 4- 3+ 4- 4- 4- 4- 4- 4- '4 4 4  ' Ankle eversion 3+ 4- 3+ 4- 4- 4- 3+ 3+ 4- '4 4 4  ' (Blank rows = not tested)  BED MOBILITY:  Sit to supine SBA Supine to sit SBA  TRANSFERS: Assistive device utilized: Single point cane and None  Sit to stand: Modified independence Stand to sit: Modified independence Chair to chair: Modified independence Floor:  NT  GAIT: Gait pattern: step through pattern, decreased stride length, decreased hip/knee flexion- Right, decreased hip/knee flexion- Left, decreased trunk rotation, and wide BOS Distance walked: 80 Assistive device utilized: Single point cane Level of assistance: SBA Comments: Gait speed: 2.10 ft/sec with SPC, 2.31 ft/sec w/o AD  RAMP: Level of Assistance:  NT Assistive device utilized:    Ramp Comments:   CURB:  Level of Assistance:  NT Assistive device utilized:    Curb Comments:   STAIRS:  Level of Assistance:  NT  Stair Negotiation Technique:   Number of Stairs:    Height of Stairs:   Comments:   FUNCTIONAL TESTS:  5 times sit to stand: 13.0 sec Timed up and go (TUG): 13.53 sec with SPC; 14.19 sec w/o AD 10 meter walk test: 15.60 sec with SPC; 13.04 sec w/o AD Berg Balance Scale: 39/56 - scores 37-45 indicate significant  risk for falls (>80%) Dynamic Gait Index: 11/24; Scores of 19 or less are predictive of falls in older community living adults  PATIENT SURVEYS:  ABC scale 55% of self confidence; 45% impairment  TODAY'S TREATMENT:   06/20/22 THERAPEUTIC EXERCISE: to improve flexibility, strength and mobility.  Verbal and tactile cues throughout for technique. NuStep - L7 x 6 min (UE/LE)  THERAPEUTIC ACTIVITIES: ABC scale: 730 / 1600 = 45.6% Berg: 48/56; 46-51 moderate fall risk (>50%)  10MWT =  11.50 sec with SPC; 10.06 sec w/o AD Gait speed: 2.85 ft/sec with SPC; 3.26 ft/sec w/o AD DGI: 18/24; Scores of 19 or less are predictive of falls in older community living adults   06/15/22 THERAPEUTIC EXERCISE: to improve flexibility, strength and mobility.  Verbal and tactile cues throughout for technique. NuStep - L5 x 7 min (UE/LE) Trunk extension in standing with back of hips on counter 10 x 5" Standing TrA + RTB row 10 x 5" Standing TrA + RTB scap retraction and shoulder extension to neutral 10 x 5"  GAIT TRAINING: To improve safety with SPC on all surfaces . 1000 ft with SPC over various outdoor surfaces including cement sidewalks, paved, gravel, stones and grass - no instability or LOB but increased fatigue/effort reported on uneven surfaces due to feeling of having to pick feet up higher  NEUROMUSCULAR RE-EDUCATION: To improve balance, coordination, and reduce fall risk. Stepping over sequential obstacles (yoga blocks) - initially with UE support on counter, progressing to [facility]   06/13/22 Therapeutic Exercise: Recumbent Bike L1x63mn - mildly aggravated LB Seated lumbar flexion rollout AROM green pball 10x 3 sec hold Seated deadlift x 15 Seated pallof press yellow wt ball x 10 Seated trunk rotations with yellow wt ball x 10 Standing shld ext on lat pull machine 10# x 10 Traditional lat pulls in standing 15# x 10 Seated row 20# x 10   PATIENT EDUCATION: Education details: progress with PT and ongoing PT POC Person educated: Patient Education method: Explanation Education comprehension: verbalized understanding   HOME EXERCISE PROGRAM: Access Code: [email or web address]: [email or web address] Date: 03/22/2022 Prepared by: JAnnie Paras Exercises - Standing Gastroc Stretch at Counter  - 1-2 x daily - 7 x weekly - 2-3 reps - 30 sec hold - Standing Soleus Stretch at Counter  - 1-2 x daily - 7 x weekly - 2-3 reps - 30 sec hold - Hip Flexor Stretch on Step  - 1-2 x  daily - 7 x weekly - 2-3 reps - 30 sec hold - Side Stepping with Resistance at Ankles and Counter Support  - 1 x daily - 7 x weekly - 2 sets - 10 reps - Forward and Backward Step Over with Counter Support  - 1 x daily - 7 x weekly - 2 sets - 10 reps - Turning in Corner 360  - 1 x daily - 7 x weekly - 2 sets - 5 reps - Tandem Stance in Corner  - 1 x daily - 7 x weekly - 2 reps - 30 sec hold - Single Leg Stance with Support  - 1 x daily - 7 x weekly - 2 sets - 30 sec hold - Squat with Chair Touch  - 1 x daily - 7 x weekly - 2 sets - 10 reps - 3 sec hold - Seated Hamstring Stretch  - 1-2 x daily - 7 x weekly - 2 sets - 30 sec hold - Seated Hip Adductor Stretch  - 1-2 x daily - 7 x  weekly - 3 reps - 30 sec hold - Seated Figure 4 Piriformis Stretch  - 1-2 x daily - 7 x weekly - 2 sets - 30 sec hold - Supine Piriformis Stretch with Foot on Ground  - 1-2 x daily - 7 x weekly - 2-3 reps - 30 sec hold - Supine Lower Trunk Rotation  - 1 x daily - 7 x weekly - 2 sets - 30 sec hold - Bent Knee Fallouts with Alternating Legs  - 1 x daily - 7 x weekly - 2 sets - 10 reps - 3 sec hold - Seated Hip Abduction with Resistance  - 1 x daily - 3-4 x weekly - 2 sets - 10 reps - 3-5 sec hold - Seated Single Leg Hip Abduction with Resistance  - 1 x daily - 3-4 x weekly - 2 sets - 10 reps - 3 sec hold - Seated March with Resistance  - 1 x daily - 3-4 x weekly - 2 sets - 10 reps - 3 sec hold - Seated Shoulder Row with Anchored Resistance  - 1 x daily - 3-4 x weekly - 2 sets - 10 reps - 3-5 hold hold  Patient Education - Check for Safety   ASSESSMENT:  CLINICAL IMPRESSION: [name] Pratt "[name]" reports he has been scheduled for back surgery on 07/12/22 and is hopeful that this will help with his back pain while walking as well as allow him to build his strength back up. He has demonstrated gains in LE as well as balance per standardized testing since his hospitalization for a GI bleed August. All standardized balance test  now back at level from prior to the hospitalization but remain shy of goal levels. As he will be undergoing surgery in a few weeks, will plan to complete the remaining approved visits in his POC to maximize his strength balance to reduce his fall risk prior to surgery and facilitate better recovery following surgery, therefore will extend his PT cert date to 33/82/50.  OBJECTIVE IMPAIRMENTS Abnormal gait, decreased activity tolerance, decreased balance, decreased endurance, decreased knowledge of condition, decreased knowledge of use of DME, decreased mobility, difficulty walking, decreased ROM, decreased strength, decreased safety awareness, increased fascial restrictions, impaired perceived functional ability, increased muscle spasms, impaired flexibility, improper body mechanics, postural dysfunction, and pain.   ACTIVITY LIMITATIONS carrying, lifting, bending, standing, squatting, stairs, transfers, and locomotion level  PARTICIPATION LIMITATIONS: cleaning, laundry, interpersonal relationship, community activity, and yard work  PERSONAL FACTORS Age, Fitness, Past/current experiences, Time since onset of injury/illness/exacerbation, and 3+ comorbidities: Aortic atherosclerosis; carotid stenosis; cervical myelopathy s/p ACDF 06/2020 and posterior cervical fusion 11/2020; peripheral neuropathy; lumbar DDD/spondylosis; lumbago with sciatica/lumbar radiculopathy; OA; B TKA; BTHA; L greater trochanteric pain syndrome; L RCR 2014; recent CTR surgery - early May; hearing loss; HTN; BPPV/vertigo; B sacroiliitis; skin cancer; COPD/emphysema; sleep apnea with CPAP; GERD; gout; HLD; LE cramps; RLS  are also affecting patient's functional outcome.   REHAB POTENTIAL: Good  CLINICAL DECISION MAKING: Evolving/moderate complexity  EVALUATION COMPLEXITY: Moderate  GOALS: Goals reviewed with patient? Yes  SHORT TERM GOALS: Target date: 03/22/2022, extended to 06/01/22   Patient will be independent with initial  HEP. Baseline:  Goal status: MET  03/22/22  2.  Patient will demonstrate decreased fall risk by scoring > 45/56 on Berg. Baseline: 39/56 (eval - 02/22/22); 43/56 (03/22/22); 48/56 (04/17/22); decreased to 40/56 (05/04/22) following hospitalization; 43/56 (06/06/22) Goal status: MET  06/20/22 - Increased to 48/56  3.  Patient will be educated on strategies  to decrease risk of falls.  Baseline:  Goal status: MET  03/22/22 - Education provided last visit - Pt reports no concerns as he went through the home checklist other than the bathroom mat non-skid backing has disintegrated from repeated washing (he states he pushes it to the side while he is in the BR)   LONG TERM GOALS: Target date: 04/19/2022, extended to 05/29/2022, then 06/29/22   Patient will be independent with advanced/ongoing HEP to improve outcomes and carryover.  Baseline:  Goal status: IN PROGRESS  05/29/22 - Pt instructed to rotate through HEP exercise in smaller groups to reduce fatigue and allow for recovery time.  2.  Patient will be able to ambulate 600' with LRAD with good safety to access community.  Baseline:  Goal status: IN PROGRESS  3.  Patient will be able to step up/down curb safely with LRAD for safety with community ambulation.  Baseline:  Goal status: MET  04/17/22  4.  Patient will demonstrate improved functional LE strength as demonstrated by overall MMT >/= 4 to 4+/5. Baseline:  Goal status: IN PROGRESS  06/06/22 - Slowly improving but R weaker than L (refer to MMT chart)  5.  Patient will demonstrate at least 19/24 on DGI to improve gait stability and reduce risk for falls. Baseline: 11/24 (eval); 16/24 (03/31/22); 18/24 (04/17/22); 12/24 (05/04/22); 14/24 (06/06/22); 18/24 (06/20/22) Goal status: IN PROGRESS  10/10//23 - Increased to 18/24   6.  Patient will score >/= 52/56 on Berg Balance test to demonstrate lower risk of falls. (MCID= 8 points) .  Baseline: 39/56 (eval - 02/22/22); 43/56 (03/22/22); 48/56 (04/17/22);  40/56 (05/04/22); 43/56 (06/06/22); 48/56 (06/20/22) Goal status: IN PROGRESS  06/20/22 - Increased to 48/56  7.  Patient will report >67% of self confidence on ABC scale to demonstrate improved functional ability. Baseline: 55% self confidence (eval); 53.8 % (04/17/22); 45.6% (06/20/22) Goal status: IN PROGRESS  8.  Patient will demonstrate gait speed of >/= 2.62 ft/sec (0.80 m/s) to be a safe community ambulator with decreased risk for recurrent falls.  Baseline: 2.10 ft/sec with SPC, 2.31 ft/sec w/o AD (eval); 2.90 ft/sec with SPC; 2.92 ft/sec w/o AD (03/31/22); 2.99 ft/sec with SPC; 3.14 ft/sec w/o AD (04/17/22); 2.77 ft/sec with SPC; 3.09 ft/sec w/o AD but much more unsteady (05/04/22);  2.71 ft/sec with SPC; 3.16 ft/sec w/o AD (06/06/22); 2.85 ft/sec with SPC; 3.26 ft/sec w/o AD (06/20/22) Goal status: MET  03/31/22   PLAN: PT FREQUENCY: 2x/week  PT DURATION: 7-8 weeks  PLANNED INTERVENTIONS: Therapeutic exercises, Therapeutic activity, Neuromuscular re-education, Balance training, Gait training, Patient/Family education, Joint mobilization, Stair training, Vestibular training, Canalith repositioning, Dry Needling, Electrical stimulation, Spinal manipulation, Cryotherapy, Moist heat, Taping, Ultrasound, Ionotophoresis 26m/ml Dexamethasone, Manual therapy, and Re-evaluation  PLAN FOR NEXT SESSION:  progress lumbopelvic/LE flexibility and strengthening; balance training with close guarding; HEP review/update/modification as needed   JPercival Spanish PT 06/20/2022, 12:42 PM

## 2022-06-21 DIAGNOSIS — R29898 Other symptoms and signs involving the musculoskeletal system: Secondary | ICD-10-CM | POA: Diagnosis not present

## 2022-06-22 ENCOUNTER — Ambulatory Visit: Payer: Medicare HMO | Admitting: Physical Therapy

## 2022-06-23 DIAGNOSIS — Z0100 Encounter for examination of eyes and vision without abnormal findings: Secondary | ICD-10-CM | POA: Diagnosis not present

## 2022-06-27 ENCOUNTER — Ambulatory Visit: Payer: Medicare HMO

## 2022-06-27 DIAGNOSIS — R2681 Unsteadiness on feet: Secondary | ICD-10-CM

## 2022-06-27 DIAGNOSIS — M5459 Other low back pain: Secondary | ICD-10-CM | POA: Diagnosis not present

## 2022-06-27 DIAGNOSIS — R2689 Other abnormalities of gait and mobility: Secondary | ICD-10-CM | POA: Diagnosis not present

## 2022-06-27 DIAGNOSIS — R296 Repeated falls: Secondary | ICD-10-CM | POA: Diagnosis not present

## 2022-06-27 DIAGNOSIS — M6281 Muscle weakness (generalized): Secondary | ICD-10-CM

## 2022-06-27 NOTE — Addendum Note (Signed)
Addended by: Trudie Buckler on: 06/27/2022 08:24 AM   Modules accepted: Orders

## 2022-06-27 NOTE — Therapy (Signed)
OUTPATIENT PHYSICAL THERAPY  TREATMENT    Patient Name: [name] Pratt MRN: [id number] DOB:November 28, 1940, 81 y.o., male Today's Date: 06/27/2022      PT End of Session - 06/27/22 1106     Visit Number 24    Number of Visits 27    Date for PT Re-Evaluation 07/10/22    Authorization Type [name]    Authorization Time Period 02/24/22 - 07/18/22    Authorization - Visit Number 23    Authorization - Number of Visits 26    PT Start Time 1019    PT Stop Time 1100    PT Time Calculation (min) 41 min    Activity Tolerance Patient tolerated treatment well    Behavior During Therapy [facility] for tasks assessed/performed                           Past Medical History:  Diagnosis Date   Allergic rhinitis 02/02/2016   Anemia 12/17/2009   Formatting of this note might be different from the original. Anemia  10/1 IMO update   Aortic atherosclerosis ([place]) 03/05/2020   Arthritis    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   Atypical chest pain 11/24/2015   Benign essential hypertension 05/13/2009   Qualifier: Diagnosis of  By: Larwance Sachs of this note might be different from the original. Hypertension   Benign paroxysmal positional vertigo 09/14/2021   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[place]) 07/22/2021   Body mass index (BMI) 25.0-25.9, adult 11/02/2020   Cancer ([place])    skin cancer   Cardiac murmur 07/25/2021   Carotid stenosis    a. Carotid U/S 4/49: LICA < 75%, RICA 30-05%;  b.  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   Cystoid macular edema of both eyes 07/28/2020   Degenerative disc  disease, lumbar 06/30/2015   Deviated nasal septum 02/18/2015   Diverticulosis 03/03/2020   DJD (degenerative joint disease) of hip 12/24/2012   Dyspnea 06/12/2011   Emphysema, unspecified ([place]) 03/05/2020   Erectile dysfunction 06/20/2017   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[place]) 02/09/2021   Gait disorder 03/02/2021   GERD (gastroesophageal reflux disease)    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   Qualifier: Diagnosis of  By: Burnett Kanaris     Greater trochanteric pain syndrome 07/22/2021   Hand pain, left 01/16/2022   Hearing loss 02/02/2016   Heme positive stool 02/29/2020   HIATAL HERNIA 05/13/2009   Qualifier: Diagnosis of  By: Burnett Kanaris     High risk medication use 01/18/2012   Hip pain 03/02/2021   History of chicken pox    History of COVID-19 12/05/2021   History of hemorrhoids    History of kidney stones    History of skin cancer 07/22/2021   skin cancer   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 Cardiology - Dr Frances Nickels at [facility] cardiology  ICD-10 cut over   Hypertension    under  control   Hypokalemia 07/08/2021   Hypothyroidism    Internal hemorrhoids 03/03/2020   Leg cramps 11/13/2019   Lower GI bleed 12/12/2012   Lumbago of lumbar region with sciatica 10/21/2020   Lumbar radiculopathy 03/02/2021   Lumbar spondylosis 11/02/2020   NEPHROLITHIASIS 05/13/2009   Qualifier: Diagnosis of  By: Burnett Kanaris     Nonspecific abnormal electrocardiogram (ECG) (EKG) 01/06/2013   Numbness of hand 01/24/2022   Onychomycosis 06/30/2015   Osteoarthrosis, 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 Osteoarthritis Of The Hand  10/1 IMO update   Osteoarthrosis, unspecified whether generalized or localized, pelvic region and  thigh 07/25/2010   Formatting of this note might be different from the original. Osteoarthritis Of The Hip   Other abnormal glucose 07/22/2021   Other allergic rhinitis 02/18/2015   Palpitations 07/25/2021   Peripheral neuropathy 06/19/2016   Preventative health care 11/24/2015   Right groin pain 11/29/2012   Right inguinal hernia 05/13/2009   Qualifier: Diagnosis of  By: Burnett Kanaris     RLS (restless legs syndrome) 02/18/2015   Rosacea    Rotator cuff tear 07/27/2013   Situational depression 03/02/2021   Skin lesion 01/16/2022   Sleep apnea with use of continuous positive airway pressure (CPAP) 07/15/2013   CPAP set to  7 cm water,  Residual AHi was 7.8  With no leak.  User time 6 hours.  479 days , not used only 12 days - highly compliant 06-23-13  .    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   Urinary retention 11/26/2020   Ventral hernia 07/08/2012   Vertigo 07/08/2021   Weight loss 03/03/2020   Past Surgical History:  Procedure Laterality Date   ABDOMINAL HERNIA REPAIR  07/15/12   Dr [name]   ANTERIOR CERVICAL DECOMP/DISCECTOMY FUSION N/A 07/09/2020   Procedure: ANTERIOR CERVICAL DECOMPRESSION AND FUSION CERVICAL THREE-FOUR.;  Surgeon: [name], MD;  Location: [place];  Service: Neurosurgery;  Laterality: N/A;  anterior   BROW LIFT  05/07/01   COLONOSCOPY WITH PROPOFOL N/A 04/24/2022   Procedure: COLONOSCOPY WITH PROPOFOL;  Surgeon: Thornton Park, MD;  Location: [place];  Service: Gastroenterology;  Laterality: N/A;   COLONOSCOPY WITH PROPOFOL N/A 04/26/2022   Procedure: COLONOSCOPY WITH PROPOFOL;  Surgeon: Thornton Park, MD;   Location: [place];  Service: Gastroenterology;  Laterality: N/A;   CYSTOSCOPY WITH URETEROSCOPY AND STENT PLACEMENT Right 01/28/2014   Procedure: CYSTOSCOPY WITH URETEROSCOPY, BASKET RETRIVAL AND  STENT PLACEMENT;  Surgeon: [name], MD;  Location: [facility];  Service: Urology;  Laterality: Right;   epidural injections     multiple procedures   ESOPHAGEAL DILATION  1993 and 1994   multiple times   EYE SURGERY Bilateral 03/25/10, 2012   cataract removal   HEMORRHOID SURGERY  2014   HIATAL HERNIA REPAIR  12/21/93   HOLMIUM LASER APPLICATION Right 10/23/9415   Procedure: HOLMIUM LASER APPLICATION;  Surgeon: [name], MD;  Location: [facility];  Service: Urology;  Laterality: Right;   INGUINAL HERNIA REPAIR Right 07/15/12   Dr [name], x2   JOINT REPLACEMENT  07/25/04   right knee   JOINT REPLACEMENT  07/13/10   left knee   KNEE SURGERY  1995   LAPAROSCOPIC CHOLECYSTECTOMY  09/20/06   with intraoperative cholangiogram and right inguinal herniorrhaphy with mesh    LIGAMENT REPAIR Left 07/2013   shoulder   LITHOTRIPSY     x2   NASAL SEPTUM SURGERY  1975   POLYPECTOMY  04/26/2022   Procedure: POLYPECTOMY;  Surgeon: Thornton Park, MD;  Location: [place];  Service: Gastroenterology;;   POSTERIOR CERVICAL FUSION/FORAMINOTOMY N/A 11/24/2020   Procedure: Posterior Cervical Laminectomy Cervical three-four, Cervical four-five with lateral mass fusion;  Surgeon: [name], MD;  Location: [place];  Service: Neurosurgery;  Laterality: N/A;   REFRACTIVE SURGERY Bilateral    Right total hip replacement  4/14   SKIN CANCER DESTRUCTION     nose, ear   TONSILLECTOMY  as child   TOTAL HIP ARTHROPLASTY Left    URETHRAL DILATION  1991   [name]   Patient Active Problem List   Diagnosis Date Noted   Hand weakness 05/02/2022   Acute kidney injury ([place]) 05/02/2022   Adenomatous polyp of ascending colon    Adenomatous polyp of colon    Acute metabolic encephalopathy 16/06/9603   GI bleed  04/21/2022   Vasovagal syncope    Hemorrhagic shock (HCC)    Symptomatic anemia    Hematochezia    Acute blood loss anemia    Irregular heart beat 02/03/2022   Constipation 02/03/2022   Carpal tunnel syndrome 01/24/2022   Numbness of hand 01/24/2022   Ulnar neuropathy 01/24/2022   Skin lesion 01/16/2022   History of COVID-19 12/05/2021   Benign paroxysmal positional vertigo 09/14/2021   Palpitations 07/25/2021   Cardiac murmur 07/25/2021   History of chicken pox 07/22/2021   History of hemorrhoids 07/22/2021   Hypertension 07/22/2021   COPD (chronic obstructive pulmonary disease) ([place]) 07/22/2021   History of kidney stones 07/22/2021   History of skin cancer 54/05/8118   Complication of anesthesia 07/22/2021   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[place]) 07/22/2021   Other abnormal glucose 07/22/2021   Spondylitis ([place]) 07/22/2021   Greater trochanteric pain syndrome 07/22/2021   Vertigo 07/08/2021   Hypokalemia 07/08/2021   Trochanteric bursitis of left hip 06/30/2021   Cancer ([place]) 04/11/2021   Lumbar radiculopathy 03/02/2021   Gait instability 03/02/2021   Hip pain 03/02/2021   Situational depression 03/02/2021   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[place]) 02/09/2021   Urinary retention 11/26/2020   Cervical myelopathy ([place]) 11/24/2020   Body mass index (BMI) 25.0-25.9, adult 11/02/2020   Lumbar spondylosis 11/02/2020   Lumbago of lumbar region with sciatica 10/21/2020   Cystoid macular edema of both eyes 07/28/2020   Status post cervical spinal fusion 07/09/2020   History of total bilateral knee replacement 07/08/2020   Aortic atherosclerosis ([place]) 03/05/2020   Emphysema, unspecified ([place]) 03/05/2020   Arthritis 03/04/2020   Diverticulosis 03/03/2020   Internal hemorrhoids 03/03/2020   Weight loss 03/03/2020   Heme positive stool 02/29/2020   Leg cramps 11/13/2019   Tibialis anterior tendon tear, nontraumatic  11/13/2019   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 07/29/2018   CPAP (continuous positive airway pressure) dependence 03/25/2018   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   Erectile dysfunction 06/20/2017   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 03/06/2017   Peripheral neuropathy 06/19/2016   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   Hearing loss 02/02/2016   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   Allergic rhinitis 02/02/2016   Atypical  chest pain 11/24/2015   Preventative health care 11/24/2015   Onychomycosis 06/30/2015   Degenerative disc disease, lumbar 06/30/2015   Other allergic rhinitis 02/18/2015   Deviated nasal septum 02/18/2015   RLS (restless legs syndrome) 02/18/2015   GERD (gastroesophageal reflux disease) 09/18/2014   Hyperglycemia 03/03/2014   Rotator cuff tear 07/27/2013   Sleep apnea with use of continuous positive airway pressure (CPAP) 07/15/2013   Carotid stenosis 02/24/2013   Nonspecific abnormal electrocardiogram (ECG) (EKG) 01/06/2013   DJD (degenerative joint disease) of hip 12/24/2012   Lower GI bleed 12/12/2012   Right groin pain 11/29/2012   OSA (obstructive sleep apnea) 11/25/2012   Ventral hernia 07/08/2012   High risk medication use 01/18/2012   Dyspnea 06/12/2011   Hyperlipidemia with target LDL less than 130 12/27/2010   Osteoarthrosis, unspecified whether generalized or localized, pelvic region and thigh 07/25/2010   Osteoarthrosis, hand 06/13/2010   Anemia 12/17/2009   Hypothyroidism 05/13/2009   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   Benign essential hypertension 05/13/2009   Right inguinal hernia 05/13/2009   HIATAL HERNIA 05/13/2009   NEPHROLITHIASIS 05/13/2009   ROSACEA 05/13/2009    PCP: [name], NP  REFERRING PROVIDER: [name], NP  REFERRING DIAG: R26.81 (ICD-10-CM) - Gait instability   THERAPY DIAG:  Unsteadiness on feet  Other abnormalities of gait and mobility  Muscle weakness  (generalized)  Repeated falls  Other low back pain  RATIONALE FOR EVALUATION AND TREATMENT:  Rehabilitation  ONSET DATE:  Fall 01/12/22 (2 falls in past 6 months)  SUBJECTIVE:   SUBJECTIVE STATEMENT: Pt wants to work on strengthening his back more before getting surgery.   PAIN:  Are you having pain? Yes: NPRS scale: intermittent 2/10 Pain location: R & midline low back Pain description: aching, pulling Aggravating factors: walking, bending over Relieving factors: heat  PERTINENT HISTORY:  Aortic atherosclerosis; carotid stenosis; cervical myelopathy s/p ACDF 06/2020 and posterior cervical fusion 11/2020; peripheral neuropathy; lumbar DDD/spondylosis; lumbago with sciatica/lumbar radiculopathy; OA; B TKA; B THA; L greater trochanteric pain syndrome; L RCR 2014; recent CTR surgery - early May; hearing loss; HTN; BPPV/vertigo; B sacroiliitis; skin cancer; COPD/emphysema; sleep apnea with CPAP; GERD; gout; HLD; LE cramps; RLS  PRECAUTIONS: None  WEIGHT BEARING RESTRICTIONS: No  FALLS: Has patient fallen in last 6 months? Yes. 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 No acute intracranial abnormality.  2. No acute fracture or traumatic subluxation of the cervical  spine. L wrist x-ray: 1. Mild dorsal wrist soft tissue swelling with small ossific density overlying the proximal carpal row. Correlate for triquetral fracture.  2. Moderate scaphotrapeziotrapezoid and mild first CMC joint osteoarthritis. L elbow x-ray: 1. No acute fracture or dislocation, left elbow.  2. Mild elevation of the anterior fat pad suggestive of a  small joint effusion, which may be reactive in the setting of elbow osteoarthritis. L shoulder x-rays: No acute osseous abnormality.  COGNITION: Overall cognitive status: Within functional limits for tasks assessed and Impaired: Memory: Impaired: Short term Procedural   SENSATION: Peripheral neuropathy in B feet  COORDINATION: Mildly impaired with slow movements  EDEMA:  Mild to moderate edema in distal B LE  MUSCLE LENGTH: Hamstrings: mod tight R>L ITB: mod tight B Piriformis: mod tight L>R Hip flexors: mod tight B Quads: mod tight B Heelcord: mod tight B  POSTURE:  rounded shoulders, forward head, and flexed trunk   LOWER EXTREMITY MMT:   Tested in sitting on eval 02/22/22, 03/22/22, 04/17/22, 05/04/22, 926/23 & 06/20/22 MMT Right 02/22/22 Left 02/22/22 Right 03/22/22 Left 03/22/22 Right 04/17/22 Left  04/17/22 Right  05/04/22 Left  05/04/22 Right  06/06/22 Left  06/06/22 Right 06/20/22 Left 06/20/22  Hip flexion 3+ 4- 3+ 4- 4- 4- 3+ 3+ 4- 4 4- 4  Hip extension 3- 3- 3+ 3+ 3+ 3+ 3 3 3+ 4- 4- 4-  Hip abduction 4- 4 4 4+ 4 4+ 4- 4 4- 4 4 4+  Hip adduction '4 4 4 ' 4+ 4+ 4+ '4 4 4 ' 4+ 4+ 4+  Hip internal rotation 3 3+ 3 3+ 3 3+ 3- 3 3+ 4- 4- 4  Hip external rotation 3+ 3+ 3+ 3+ 3+ 3+ 3 3+ 3+ 4- 3+ 4-  Knee flexion 4- 4- 4 4 4+ 4+ 4- 4- 4 4+ 4+ 4+  Knee extension 4- 4 4 4+ 4+ 4+ 4 4 4+ 4+ 4+ 4+  Ankle dorsiflexion 4- 4 4- 4 4 4- 4- 4- 4- '4 4 4  ' Ankle plantarflexion 3+  6 SLS HR 4 10 SLS HR 4+ 20 SLS HR Limited lift on last few reps 5 20 SLS HR 5- 18 SLS HR 5 20 SLS HR 4- 7 SLS HR Pt noting increased proximal LE pain 4+ 15  SLS HR 5- 20 SLS HR Limited lift on last few reps 5 20 SLS HR 5- 5  Ankle inversion 3+ 4- 3+ 4- 4- 4- 4- 4- 4- '4 4 4  ' Ankle eversion 3+ 4- 3+ 4- 4- 4- 3+ 3+ 4- '4 4 4  ' (Blank rows = not tested)  BED MOBILITY:  Sit to supine SBA Supine to sit SBA  TRANSFERS: Assistive device utilized: Single point cane and None  Sit to stand: Modified independence Stand to sit: Modified independence Chair to chair: Modified independence Floor:  NT  GAIT: Gait pattern: step through pattern, decreased stride length, decreased hip/knee flexion- Right, decreased hip/knee flexion- Left, decreased trunk rotation, and wide BOS Distance walked: 80 Assistive device utilized: Single point cane Level of assistance: SBA Comments: Gait speed: 2.10 ft/sec with SPC, 2.31 ft/sec w/o AD  RAMP: Level of Assistance:  NT Assistive device utilized:    Ramp Comments:   CURB:  Level of Assistance:  NT Assistive device utilized:    Curb Comments:   STAIRS:  Level of Assistance:  NT  Stair Negotiation Technique:   Number of Stairs:    Height of Stairs:   Comments:   FUNCTIONAL TESTS:  5 times sit to stand: 13.0 sec Timed up and go (TUG): 13.53 sec with SPC; 14.19 sec w/o AD 10 meter walk test: 15.60 sec with SPC; 13.04 sec w/o AD Berg Balance Scale: 39/56 - scores 37-45 indicate significant risk for falls (>80%) Dynamic Gait Index: 11/24; Scores of 19 or less are predictive of falls  in older community living adults  PATIENT SURVEYS:  ABC scale 55% of self confidence; 45% impairment  TODAY'S TREATMENT:  06/27/22 Therapeutic Exercise: Nustep L6x31mn Seated row 25# 2x10 Alt toe taps on 9' stool x 10 bil Step up and over foam roll x 15 bil - 1HA Standing shoulder extension 15# x 10 bil Standing lat pull 20# x 15  Seated hip hinges x 10 Standing step ups x 10 bil - 2HA  06/20/22 THERAPEUTIC EXERCISE: to improve flexibility, strength and mobility.  Verbal and tactile cues throughout for  technique. NuStep - L7 x 6 min (UE/LE)  THERAPEUTIC ACTIVITIES: ABC scale: 730 / 1600 = 45.6% Berg: 48/56; 46-51 moderate fall risk (>50%)  10MWT = 11.50 sec with SPC; 10.06 sec w/o AD Gait speed: 2.85 ft/sec with SPC; 3.26 ft/sec w/o AD DGI: 18/24; Scores of 19 or less are predictive of falls in older community living adults   06/15/22 THERAPEUTIC EXERCISE: to improve flexibility, strength and mobility.  Verbal and tactile cues throughout for technique. NuStep - L5 x 7 min (UE/LE) Trunk extension in standing with back of hips on counter 10 x 5" Standing TrA + RTB row 10 x 5" Standing TrA + RTB scap retraction and shoulder extension to neutral 10 x 5"  GAIT TRAINING: To improve safety with SPC on all surfaces . 1000 ft with SPC over various outdoor surfaces including cement sidewalks, paved, gravel, stones and grass - no instability or LOB but increased fatigue/effort reported on uneven surfaces due to feeling of having to pick feet up higher  NEUROMUSCULAR RE-EDUCATION: To improve balance, coordination, and reduce fall risk. Stepping over sequential obstacles (yoga blocks) - initially with UE support on counter, [facility]     PATIENT EDUCATION: Education details: progress with PT and ongoing PT POC Person educated: Patient Education method: Explanation Education comprehension: verbalized understanding   HOME EXERCISE PROGRAM: Access Code: [email or web address]: [email or web address] Date: 03/22/2022 Prepared by: JAnnie Paras Exercises - Standing Gastroc Stretch at Counter  - 1-2 x daily - 7 x weekly - 2-3 reps - 30 sec hold - Standing Soleus Stretch at Counter  - 1-2 x daily - 7 x weekly - 2-3 reps - 30 sec hold - Hip Flexor Stretch on Step  - 1-2 x daily - 7 x weekly - 2-3 reps - 30 sec hold - Side Stepping with Resistance at Ankles and Counter Support  - 1 x daily - 7 x weekly - 2 sets - 10 reps - Forward and Backward Step Over with Counter Support  - 1  x daily - 7 x weekly - 2 sets - 10 reps - Turning in Corner 360  - 1 x daily - 7 x weekly - 2 sets - 5 reps - Tandem Stance in Corner  - 1 x daily - 7 x weekly - 2 reps - 30 sec hold - Single Leg Stance with Support  - 1 x daily - 7 x weekly - 2 sets - 30 sec hold - Squat with Chair Touch  - 1 x daily - 7 x weekly - 2 sets - 10 reps - 3 sec hold - Seated Hamstring Stretch  - 1-2 x daily - 7 x weekly - 2 sets - 30 sec hold - Seated Hip Adductor Stretch  - 1-2 x daily - 7 x weekly - 3 reps - 30 sec hold - Seated Figure 4 Piriformis Stretch  - 1-2 x daily - 7 x weekly - 2  sets - 30 sec hold - Supine Piriformis Stretch with Foot on Ground  - 1-2 x daily - 7 x weekly - 2-3 reps - 30 sec hold - Supine Lower Trunk Rotation  - 1 x daily - 7 x weekly - 2 sets - 30 sec hold - Bent Knee Fallouts with Alternating Legs  - 1 x daily - 7 x weekly - 2 sets - 10 reps - 3 sec hold - Seated Hip Abduction with Resistance  - 1 x daily - 3-4 x weekly - 2 sets - 10 reps - 3-5 sec hold - Seated Single Leg Hip Abduction with Resistance  - 1 x daily - 3-4 x weekly - 2 sets - 10 reps - 3 sec hold - Seated March with Resistance  - 1 x daily - 3-4 x weekly - 2 sets - 10 reps - 3 sec hold - Seated Shoulder Row with Anchored Resistance  - 1 x daily - 3-4 x weekly - 2 sets - 10 reps - 3-5 hold hold  Patient Education - Check for Safety   ASSESSMENT:  CLINICAL IMPRESSION: [name] shows improvement in upright stability and confidence with gait. We continued to work on core strengthening and proximal strengthening to improve stability. He demonstrated difficulty with the step ups w/o UE and alt toe taps. Close CGA needed for support with stepping exercises to avoid falls. Pt required 3 seated breaks with standing exercises d/t pain in low back. Overall able to finish all interventions w/o further complaints.  OBJECTIVE IMPAIRMENTS Abnormal gait, decreased activity tolerance, decreased balance, decreased endurance, decreased  knowledge of condition, decreased knowledge of use of DME, decreased mobility, difficulty walking, decreased ROM, decreased strength, decreased safety awareness, increased fascial restrictions, impaired perceived functional ability, increased muscle spasms, impaired flexibility, improper body mechanics, postural dysfunction, and pain.   ACTIVITY LIMITATIONS carrying, lifting, bending, standing, squatting, stairs, transfers, and locomotion level  PARTICIPATION LIMITATIONS: cleaning, laundry, interpersonal relationship, community activity, and yard work  PERSONAL FACTORS Age, Fitness, Past/current experiences, Time since onset of injury/illness/exacerbation, and 3+ comorbidities: Aortic atherosclerosis; carotid stenosis; cervical myelopathy s/p ACDF 06/2020 and posterior cervical fusion 11/2020; peripheral neuropathy; lumbar DDD/spondylosis; lumbago with sciatica/lumbar radiculopathy; OA; B TKA; BTHA; L greater trochanteric pain syndrome; L RCR 2014; recent CTR surgery - early May; hearing loss; HTN; BPPV/vertigo; B sacroiliitis; skin cancer; COPD/emphysema; sleep apnea with CPAP; GERD; gout; HLD; LE cramps; RLS  are also affecting patient's functional outcome.   REHAB POTENTIAL: Good  CLINICAL DECISION MAKING: Evolving/moderate complexity  EVALUATION COMPLEXITY: Moderate  GOALS: Goals reviewed with patient? Yes  SHORT TERM GOALS: Target date: 03/22/2022, extended to 06/01/22   Patient will be independent with initial HEP. Baseline:  Goal status: MET  03/22/22  2.  Patient will demonstrate decreased fall risk by scoring > 45/56 on Berg. Baseline: 39/56 (eval - 02/22/22); 43/56 (03/22/22); 48/56 (04/17/22); decreased to 40/56 (05/04/22) following hospitalization; 43/56 (06/06/22) Goal status: MET  06/20/22 - Increased to 48/56  3.  Patient will be educated on strategies to decrease risk of falls.  Baseline:  Goal status: MET  03/22/22 - Education provided last visit - Pt reports no concerns as he went  through the home checklist other than the bathroom mat non-skid backing has disintegrated from repeated washing (he states he pushes it to the side while he is in the BR)   LONG TERM GOALS: Target date: 04/19/2022, extended to 05/29/2022, then 06/29/22   Patient will be independent with advanced/ongoing HEP to improve outcomes  and carryover.  Baseline:  Goal status: IN PROGRESS  05/29/22 - Pt instructed to rotate through HEP exercise in smaller groups to reduce fatigue and allow for recovery time.  2.  Patient will be able to ambulate 600' with LRAD with good safety to access community.  Baseline:  Goal status: IN PROGRESS  3.  Patient will be able to step up/down curb safely with LRAD for safety with community ambulation.  Baseline:  Goal status: MET  04/17/22  4.  Patient will demonstrate improved functional LE strength as demonstrated by overall MMT >/= 4 to 4+/5. Baseline:  Goal status: IN PROGRESS  06/06/22 - Slowly improving but R weaker than L (refer to MMT chart)  5.  Patient will demonstrate at least 19/24 on DGI to improve gait stability and reduce risk for falls. Baseline: 11/24 (eval); 16/24 (03/31/22); 18/24 (04/17/22); 12/24 (05/04/22); 14/24 (06/06/22); 18/24 (06/20/22) Goal status: IN PROGRESS  10/10//23 - Increased to 18/24   6.  Patient will score >/= 52/56 on Berg Balance test to demonstrate lower risk of falls. (MCID= 8 points) .  Baseline: 39/56 (eval - 02/22/22); 43/56 (03/22/22); 48/56 (04/17/22); 40/56 (05/04/22); 43/56 (06/06/22); 48/56 (06/20/22) Goal status: IN PROGRESS  06/20/22 - Increased to 48/56  7.  Patient will report >67% of self confidence on ABC scale to demonstrate improved functional ability. Baseline: 55% self confidence (eval); 53.8 % (04/17/22); 45.6% (06/20/22) Goal status: IN PROGRESS  8.  Patient will demonstrate gait speed of >/= 2.62 ft/sec (0.80 m/s) to be a safe community ambulator with decreased risk for recurrent falls.  Baseline: 2.10 ft/sec with SPC,  2.31 ft/sec w/o AD (eval); 2.90 ft/sec with SPC; 2.92 ft/sec w/o AD (03/31/22); 2.99 ft/sec with SPC; 3.14 ft/sec w/o AD (04/17/22); 2.77 ft/sec with SPC; 3.09 ft/sec w/o AD but much more unsteady (05/04/22);  2.71 ft/sec with SPC; 3.16 ft/sec w/o AD (06/06/22); 2.85 ft/sec with SPC; 3.26 ft/sec w/o AD (06/20/22) Goal status: MET  03/31/22   PLAN: PT FREQUENCY: 2x/week  PT DURATION: 7-8 weeks  PLANNED INTERVENTIONS: Therapeutic exercises, Therapeutic activity, Neuromuscular re-education, Balance training, Gait training, Patient/Family education, Joint mobilization, Stair training, Vestibular training, Canalith repositioning, Dry Needling, Electrical stimulation, Spinal manipulation, Cryotherapy, Moist heat, Taping, Ultrasound, Ionotophoresis 67m/ml Dexamethasone, Manual therapy, and Re-evaluation  PLAN FOR NEXT SESSION:  progress lumbopelvic/LE flexibility and strengthening; balance training with close guarding; HEP review/update/modification as needed   BArtist Pais PTA 06/27/2022, 11:07 AM

## 2022-06-28 ENCOUNTER — Ambulatory Visit: Payer: Medicare HMO | Attending: Internal Medicine | Admitting: Nurse Practitioner

## 2022-06-28 DIAGNOSIS — R29898 Other symptoms and signs involving the musculoskeletal system: Secondary | ICD-10-CM | POA: Diagnosis not present

## 2022-06-28 DIAGNOSIS — Z01810 Encounter for preprocedural cardiovascular examination: Secondary | ICD-10-CM

## 2022-06-28 NOTE — Progress Notes (Signed)
Virtual Visit via Telephone Note   Because of [name] Pratt's co-morbid illnesses, he is at least at moderate risk for complications without adequate follow up.  This format is felt to be most appropriate for this patient at this time.  The patient did not have access to video technology/had technical difficulties with video requiring transitioning to audio format only (telephone).  All issues noted in this document were discussed and addressed.  No physical exam could be performed with this format.  Please refer to the patient's chart for his consent to telehealth for [facility].  Evaluation Performed:  Preoperative cardiovascular risk assessment _____________   Date:  06/28/2022   Patient ID:  [name] Pratt, DOB 03-09-1941, MRN [id number] Patient Location:  Home Provider location:   Office  Primary Care Provider:  [name], NP Primary Cardiologist:  Jenkins Rouge, MD  Chief Complaint / Patient Profile   81 y.o. y/o male with a h/o aortic atherosclerosis, hypertension, hyperlipidemia, and osteoarthritis who is pending L2-3, L3-4 Lumbar Laminectomy on 07/12/2022 with Dr. [name] of [place] and presents today for telephonic preoperative cardiovascular risk assessment.  Past Medical History    Past Medical History:  Diagnosis Date   Allergic rhinitis 02/02/2016   Anemia 12/17/2009   Formatting of this note might be different from the original. 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 Carotid U/S 9/38: LICA < 10%, RICA 17-51%;  b.  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   Cystoid macular edema of both eyes 07/28/2020   Degenerative disc disease, lumbar 06/30/2015   Deviated nasal septum 02/18/2015   Diverticulosis 03/03/2020   DJD (degenerative joint disease) of hip 12/24/2012   Dyspnea 06/12/2011   Emphysema, unspecified ([place]) 03/05/2020   Erectile dysfunction 06/20/2017   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[place]) 02/09/2021   Gait disorder 03/02/2021   GERD (gastroesophageal reflux disease)    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   Qualifier: Diagnosis of  By: Burnett Kanaris     Greater trochanteric pain syndrome 07/22/2021   Hand pain, left 01/16/2022   Hearing loss 02/02/2016   Heme positive stool 02/29/2020   HIATAL HERNIA 05/13/2009   Qualifier: Diagnosis of  By: Burnett Kanaris     High risk medication use 01/18/2012   Hip pain 03/02/2021   History of chicken pox    History of COVID-19 12/05/2021   History of hemorrhoids    History of kidney stones    History of skin cancer 07/22/2021   skin cancer   History of total  bilateral knee replacement 07/08/2020   Hyperglycemia 03/03/2014   Hyperlipidemia with target LDL less than 130 12/27/2010   Formatting of this note might be different from the original. Cardiology - [facility] Frances [facility]els [facility] cardiology  ICD-10 cut over   Hypertension    under control   Hypokalemia 07/08/2021   Hypothyroidism    Internal hemorrhoids 03/03/2020   Leg  cramps 11/13/2019   Lower GI bleed 12/12/2012   Lumbago of lumbar region with sciatica 10/21/2020   Lumbar radiculopathy 03/02/2021   Lumbar spondylosis 11/02/2020   NEPHROLITHIASIS 05/13/2009   Qualifier: Diagnosis of  By: Burnett Kanaris     Nonspecific abnormal electrocardiogram (ECG) (EKG) 01/06/2013   Numbness of hand 01/24/2022   Onychomycosis 06/30/2015   Osteoarthrosis, 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 Osteoarthritis Of The Hand  10/1 IMO update   Osteoarthrosis, unspecified whether generalized or localized, pelvic region and thigh 07/25/2010   Formatting of this note might be different from the original. Osteoarthritis Of The Hip   Other abnormal glucose 07/22/2021   Other allergic rhinitis 02/18/2015   Palpitations 07/25/2021   Peripheral neuropathy 06/19/2016   Preventative health care 11/24/2015   Right groin pain 11/29/2012   Right inguinal hernia 05/13/2009   Qualifier: Diagnosis of  By: Burnett Kanaris     RLS (restless legs syndrome) 02/18/2015   Rosacea    Rotator cuff tear 07/27/2013   Situational depression 03/02/2021   Skin lesion 01/16/2022   Sleep apnea with use of continuous positive airway pressure (CPAP) 07/15/2013   CPAP set to  7 cm water,  Residual AHi was 7.8  With no leak.  User time 6 hours.  479 days , not used only 12 days - highly compliant 06-23-13  .    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   Urinary retention 11/26/2020   Ventral hernia 07/08/2012   Vertigo 07/08/2021   Weight loss 03/03/2020   Past Surgical History:  Procedure Laterality Date   ABDOMINAL  HERNIA REPAIR  07/15/12   Dr [name]   ANTERIOR CERVICAL DECOMP/DISCECTOMY FUSION N/A 07/09/2020   Procedure: ANTERIOR CERVICAL DECOMPRESSION AND FUSION CERVICAL THREE-FOUR.;  Surgeon: [name], MD;  Location: [facility];  Service: Neurosurgery;  Laterality: N/A;  anterior   BROW LIFT  05/07/01   COLONOSCOPY WITH PROPOFOL N/A 04/24/2022   Procedure: COLONOSCOPY WITH PROPOFOL;  Surgeon: Thornton Park, MD;  Location: [facility];  Service: Gastroenterology;  Laterality: N/A;   COLONOSCOPY WITH PROPOFOL N/A 04/26/2022   Procedure: COLONOSCOPY WITH PROPOFOL;  Surgeon: Thornton Park, MD;  Location: [facility];  Service: Gastroenterology;  Laterality: N/A;   CYSTOSCOPY WITH URETEROSCOPY AND STENT PLACEMENT Right 01/28/2014   Procedure: CYSTOSCOPY WITH URETEROSCOPY, BASKET RETRIVAL AND  STENT PLACEMENT;  Surgeon: [name], MD;  Location: [facility];  Service: Urology;  Laterality: Right;   epidural injections     multiple procedures   ESOPHAGEAL DILATION  1993 and 1994   multiple times   EYE SURGERY Bilateral 03/25/10, 2012   cataract removal   HEMORRHOID SURGERY  2014   HIATAL HERNIA REPAIR  12/21/93   HOLMIUM LASER APPLICATION Right 2/44/0102  Procedure: HOLMIUM LASER APPLICATION;  Surgeon: [name], MD;  Location: [facility];  Service: Urology;  Laterality: Right;   INGUINAL HERNIA REPAIR Right 07/15/12   Dr [name], x2   JOINT REPLACEMENT  07/25/04   right knee   JOINT REPLACEMENT  07/13/10   left knee   [place]  09/20/06   with intraoperative cholangiogram and right inguinal herniorrhaphy with mesh    LIGAMENT REPAIR Left 07/2013   shoulder   LITHOTRIPSY     x2   [place]   POLYPECTOMY  04/26/2022   Procedure: POLYPECTOMY;  Surgeon: Thornton Park, MD;  Location: [place] [facility];  Service: Gastroenterology;;   POSTERIOR CERVICAL FUSION/FORAMINOTOMY N/A 11/24/2020   Procedure: Posterior Cervical Laminectomy Cervical  three-four, Cervical four-five with lateral mass fusion;  Surgeon: [name], MD;  Location: [place];  Service: Neurosurgery;  Laterality: N/A;   REFRACTIVE SURGERY Bilateral    Right total hip replacement  4/14   SKIN CANCER DESTRUCTION     nose, ear   TONSILLECTOMY  as child   TOTAL HIP ARTHROPLASTY Left    URETHRAL DILATION  1991   [name]    Allergies  Allergies  Allergen Reactions   Pravastatin Other (See Comments)    Muscle cramps   Sildenafil Other (See Comments)    Tachycardia     Oxycodone Other (See Comments)    Hallucinations    Atenolol Other (See Comments)    Bradycardia    Elastic Bandages & [Zinc] Other (See Comments)    Teflon bandages - Irritation   Metoclopramide Hcl Anxiety    History of Present Illness    [name] Pratt is a 81 y.o. male who presents via audio/video conferencing for a telehealth visit today.  Pt was last seen in cardiology clinic on 01/26/2022 by Dr. Geraldo Pitter.  At that time [name] Pratt was doing well. The patient is now pending procedure as outlined above. Since his last visit, he has been stable from a cardiac standpoint. He denies chest pain, palpitations, dyspnea, pnd, orthopnea, n, v, dizziness, syncope, edema, weight gain, or early satiety. All other systems reviewed and are otherwise negative except as noted above.   Home Medications    Prior to Admission medications   Medication Sig Start Date End Date Taking? Authorizing Provider  acetaminophen (TYLENOL) 650 MG CR tablet Take 650-1,300 mg by mouth See admin instructions. Take 1,300 mg by mouth in the morning and 650 mg at bedtime    [provider]  albuterol (VENTOLIN HFA) 108 (90 Base) MCG/ACT inhaler INHALE 2 PUFFS INTO THE LUNGS EVERY 6 HOURS AS NEEDED FOR SHORTNESS OF BREATH. Patient taking differently: Inhale 2 puffs into the lungs every 6 (six) hours as needed for shortness of breath. 10/20/21   [name], NP  allopurinol (ZYLOPRIM) 100 MG  tablet TAKE 1 TABLET TWICE DAILY Patient taking differently: Take 100 mg by mouth in the morning and at bedtime. 04/17/22   [name], NP  amLODipine (NORVASC) 2.5 MG tablet Take 1 tablet (2.5 mg total) by mouth daily. 06/07/22   [name], NP  antiseptic oral rinse (BIOTENE) LIQD 15 mLs by Mouth Rinse route in the morning.    [provider]  aspirin EC 81 MG tablet Take 81 mg by mouth at bedtime.    [provider]  cycloSPORINE (RESTASIS) 0.05 % ophthalmic emulsion Place 1 drop into both eyes 2 (two) times daily.  [provider]  diclofenac Sodium (VOLTAREN) 1 % GEL Apply 2 g topically daily as needed (for arthritic pain in the hands).      docusate sodium (COLACE) 100 MG capsule Take 1 capsule (100 mg total) by mouth 2 (two) times daily. 04/27/22 04/27/23  Barb Merino, MD  fluocinonide (LIDEX) 0.05 % external solution Apply 1 application  topically 2 (two) times daily as needed for itching (SCALP). 06/13/21   [provider]  GEMTESA 75 MG TABS Take 75 mg by mouth daily. 10/20/21   [provider]  glucose blood test strip TRUE Metrix Blood Glucose Test Strip, use as instructed 11/12/20   [name], NP  Homeopathic Products ([place]) FOAM Apply 1 application topically 2 (two) times daily as needed (Pain).    [provider]  hydrALAZINE (APRESOLINE) 50 MG tablet Take 1 tablet (50 mg total) by mouth 2 (two) times daily. Patient taking differently: Take 50 mg by mouth in the morning and at bedtime. 01/17/22   [name], NP  Lancets MISC 1 each by Does not apply route 2 (two) times daily. TRUE matrix lancets 11/12/20   [name], NP  levothyroxine (SYNTHROID) 150 MCG tablet Take 150 mcg 6 days a week (patient takes 75 mcg once a week) Disp #84 for 90 day supply Patient taking differently: Take 150 mcg by mouth See admin instructions. Take 150 mcg by mouth in the morning before breakfast on  Mon/Tues/Wed/Thurs/Fri/Sat 12/01/21   [name], NP  levothyroxine (SYNTHROID) 75 MCG tablet Take 75 mcg once a week (patient on 150 mcg 6 days a week) Dispense #24 for 90 day supply. Patient taking differently: Take 75 mcg by mouth See admin instructions. Take 75 mcg by mouth in the morning before breakfast on Sundays only 12/01/21   [name], NP  lip balm (CARMEX) ointment Apply 1 Application topically as needed for lip care or dry skin.    [provider]  loratadine (CLARITIN) 10 MG tablet Take 10 mg by mouth daily as needed for allergies or rhinitis.    [provider]  losartan (COZAAR) 100 MG tablet TAKE 1 TABLET EVERY DAY Patient taking differently: Take 100 mg by mouth daily after supper. 08/20/21   [name], NP  Magnesium 500 MG TABS Take 500 mg by mouth daily.    [provider]  multivitamin [facility]) per tablet Take 1 tablet by mouth daily with breakfast.    [provider]  nitroGLYCERIN (NITROSTAT) 0.4 MG SL tablet Place 1 tablet (0.4 mg total) under the tongue every 5 (five) minutes as needed for chest pain. 09/16/21   [name], MD  NON FORMULARY Probiatics 1 a day    [provider]  ondansetron (ZOFRAN-ODT) 4 MG disintegrating tablet Take 1 tablet (4 mg total) by mouth every 8 (eight) hours as needed for nausea or vomiting. Patient taking differently: Take 4 mg by mouth every 8 (eight) hours as needed for nausea or vomiting (dissolve orally). 11/17/21   Quintella Reichert, MD  polyethylene glycol (MIRALAX / GLYCOLAX) 17 g packet Take 17-34 g by mouth daily as needed for constipation (mix and drink).    [provider]  PRESCRIPTION MEDICATION CPAP- At bedtime and during any time of rest    [provider]  rosuvastatin (CRESTOR) 5 MG tablet TAKE 1 TABLET EVERY DAY Patient taking differently: Take 5 mg by mouth daily. 10/21/21   [name], NP  sodium chloride (OCEAN) 0.65 % nasal  spray  Place 1 spray into both nostrils at bedtime.    [provider]  traMADol (ULTRAM) 50 MG tablet Take 1 tablet (50 mg total) by mouth every 12 (twelve) hours as needed. 01/16/22   [name], NP  vitamin B-12 (CYANOCOBALAMIN) 500 MCG tablet Take 500 mcg by mouth daily.    [provider]    Physical Exam    Vital Signs:  [name] Pratt does not have vital signs available for review today.  Given telephonic nature of communication, physical exam is limited. AAOx3. NAD. Normal affect.  Speech and respirations are unlabored.  Accessory Clinical Findings    None  Assessment & Plan    1.  Preoperative Cardiovascular Risk Assessment:  According to the Revised Cardiac Risk Index (RCRI), his Perioperative Risk of Major Cardiac Event is (%): 0.4. His Functional Capacity in METs is: 5.07 according to the [place] Activity Status Index (DASI).Therefore, based on ACC/AHA guidelines, patient would be at acceptable risk for the planned procedure without further cardiovascular testing.  The patient was advised that if he develops new symptoms prior to surgery to contact our office to arrange for a follow-up visit, and he verbalized understanding.  Per office protocol, he may hold aspirin for 5-7 days prior to procedure and resume as soon as possible post-procedure.      A copy of this note will be routed to requesting surgeon.  Time:   Today, I have spent 4 minutes with the patient with telehealth technology discussing medical history, symptoms, and management plan.     Lenna Sciara, NP  06/28/2022, 9:30 AM

## 2022-06-29 ENCOUNTER — Encounter: Payer: Self-pay | Admitting: Physical Therapy

## 2022-06-29 ENCOUNTER — Ambulatory Visit: Payer: Medicare HMO | Admitting: Physical Therapy

## 2022-06-29 DIAGNOSIS — R2689 Other abnormalities of gait and mobility: Secondary | ICD-10-CM | POA: Diagnosis not present

## 2022-06-29 DIAGNOSIS — M5459 Other low back pain: Secondary | ICD-10-CM | POA: Diagnosis not present

## 2022-06-29 DIAGNOSIS — R296 Repeated falls: Secondary | ICD-10-CM | POA: Diagnosis not present

## 2022-06-29 DIAGNOSIS — M6281 Muscle weakness (generalized): Secondary | ICD-10-CM

## 2022-06-29 DIAGNOSIS — R2681 Unsteadiness on feet: Secondary | ICD-10-CM

## 2022-06-29 NOTE — Therapy (Addendum)
OUTPATIENT PHYSICAL THERAPY  TREATMENT / DISCHARGE SUMMARY   Patient Name: [name] Pratt MRN: [id number] DOB:Nov 06, 1940, 81 y.o., male Today's Date: 06/29/2022      PT End of Session - 06/29/22 1017     Visit Number 25    Number of Visits 27    Date for PT Re-Evaluation 07/10/22    Authorization Type [name]    Authorization Time Period 02/24/22 - 07/18/22    Authorization - Visit Number 24    Authorization - Number of Visits 26    PT Start Time 1017    PT Stop Time 1057    PT Time Calculation (min) 40 min    Activity Tolerance Patient tolerated treatment well    Behavior During Therapy [facility] for tasks assessed/performed                            Past Medical History:  Diagnosis Date   Allergic rhinitis 02/02/2016   Anemia 12/17/2009   Formatting of this note might be different from the original. Anemia  10/1 IMO update   Aortic atherosclerosis ([place]) 03/05/2020   Arthritis    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   Atypical chest pain 11/24/2015   Benign essential hypertension 05/13/2009   Qualifier: Diagnosis of  By: Larwance Sachs of this note might be different from the original. Hypertension   Benign paroxysmal positional vertigo 09/14/2021   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[place]) 07/22/2021   Body mass index (BMI) 25.0-25.9, adult 11/02/2020   Cancer ([place])    skin cancer   Cardiac murmur 07/25/2021   Carotid stenosis    a. Carotid U/S 8/09: LICA < 98%, RICA 33-82%;  b.  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   Cystoid macular edema of both eyes 07/28/2020    Degenerative disc disease, lumbar 06/30/2015   Deviated nasal septum 02/18/2015   Diverticulosis 03/03/2020   DJD (degenerative joint disease) of hip 12/24/2012   Dyspnea 06/12/2011   Emphysema, unspecified ([place]) 03/05/2020   Erectile dysfunction 06/20/2017   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[place]) 02/09/2021   Gait disorder 03/02/2021   GERD (gastroesophageal reflux disease)    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   Qualifier: Diagnosis of  By: Burnett Kanaris     Greater trochanteric pain syndrome 07/22/2021   Hand pain, left 01/16/2022   Hearing loss 02/02/2016   Heme positive stool 02/29/2020   HIATAL HERNIA 05/13/2009   Qualifier: Diagnosis of  By: Burnett Kanaris     High risk medication use 01/18/2012   Hip pain 03/02/2021   History of chicken pox    History of COVID-19 12/05/2021   History of hemorrhoids    History of kidney stones    History of skin cancer 07/22/2021   skin cancer   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 Cardiology - Dr Frances Nickels at [facility] cardiology  ICD-10 cut over   Hypertension  under control   Hypokalemia 07/08/2021   Hypothyroidism    Internal hemorrhoids 03/03/2020   Leg cramps 11/13/2019   Lower GI bleed 12/12/2012   Lumbago of lumbar region with sciatica 10/21/2020   Lumbar radiculopathy 03/02/2021   Lumbar spondylosis 11/02/2020   NEPHROLITHIASIS 05/13/2009   Qualifier: Diagnosis of  By: Burnett Kanaris     Nonspecific abnormal electrocardiogram (ECG) (EKG) 01/06/2013   Numbness of hand 01/24/2022   Onychomycosis 06/30/2015   Osteoarthrosis, 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 Osteoarthritis Of The Hand  10/1 IMO update   Osteoarthrosis, unspecified whether generalized or localized,  pelvic region and thigh 07/25/2010   Formatting of this note might be different from the original. Osteoarthritis Of The Hip   Other abnormal glucose 07/22/2021   Other allergic rhinitis 02/18/2015   Palpitations 07/25/2021   Peripheral neuropathy 06/19/2016   Preventative health care 11/24/2015   Right groin pain 11/29/2012   Right inguinal hernia 05/13/2009   Qualifier: Diagnosis of  By: Burnett Kanaris     RLS (restless legs syndrome) 02/18/2015   Rosacea    Rotator cuff tear 07/27/2013   Situational depression 03/02/2021   Skin lesion 01/16/2022   Sleep apnea with use of continuous positive airway pressure (CPAP) 07/15/2013   CPAP set to  7 cm water,  Residual AHi was 7.8  With no leak.  User time 6 hours.  479 days , not used only 12 days - highly compliant 06-23-13  .    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   Urinary retention 11/26/2020   Ventral hernia 07/08/2012   Vertigo 07/08/2021   Weight loss 03/03/2020   Past Surgical History:  Procedure Laterality Date   ABDOMINAL HERNIA REPAIR  07/15/12   Dr [name]   ANTERIOR CERVICAL DECOMP/DISCECTOMY FUSION N/A 07/09/2020   Procedure: ANTERIOR CERVICAL DECOMPRESSION AND FUSION CERVICAL THREE-FOUR.;  Surgeon: [name], MD;  Location: [place];  Service: Neurosurgery;  Laterality: N/A;  anterior   BROW LIFT  05/07/01   COLONOSCOPY WITH PROPOFOL N/A 04/24/2022   Procedure: COLONOSCOPY WITH PROPOFOL;  Surgeon: Thornton Park, MD;  Location: [place];  Service: Gastroenterology;  Laterality: N/A;   COLONOSCOPY WITH PROPOFOL N/A 04/26/2022   Procedure: COLONOSCOPY WITH PROPOFOL;  Surgeon: Thornton Park, MD;  Location: [place];  Service: Gastroenterology;  Laterality: N/A;   CYSTOSCOPY WITH URETEROSCOPY AND STENT PLACEMENT Right 01/28/2014   Procedure: CYSTOSCOPY WITH URETEROSCOPY, BASKET RETRIVAL AND  STENT PLACEMENT;  Surgeon: [name], MD;  Location: [facility];  Service: Urology;  Laterality: Right;   epidural injections     multiple procedures   ESOPHAGEAL DILATION  1993 and 1994   multiple times   EYE SURGERY Bilateral 03/25/10, 2012   cataract removal   HEMORRHOID SURGERY  2014   HIATAL HERNIA REPAIR  12/21/93   HOLMIUM LASER APPLICATION Right 12/18/8117   Procedure: HOLMIUM LASER APPLICATION;  Surgeon: [name], MD;  Location: [facility];  Service: Urology;  Laterality: Right;   INGUINAL HERNIA REPAIR Right 07/15/12   Dr [name], x2   JOINT REPLACEMENT  07/25/04   right knee   JOINT REPLACEMENT  07/13/10   left knee   KNEE SURGERY  1995   LAPAROSCOPIC CHOLECYSTECTOMY  09/20/06   with intraoperative cholangiogram and right inguinal herniorrhaphy with mesh    LIGAMENT REPAIR Left 07/2013   shoulder   LITHOTRIPSY     x2   NASAL SEPTUM SURGERY  1975   POLYPECTOMY  04/26/2022   Procedure: POLYPECTOMY;  Surgeon: Thornton Park, MD;  Location: [place];  Service: Gastroenterology;;   POSTERIOR CERVICAL FUSION/FORAMINOTOMY N/A 11/24/2020   Procedure: Posterior Cervical Laminectomy Cervical three-four, Cervical four-five with lateral mass fusion;  Surgeon: [name], MD;  Location: [place];  Service: Neurosurgery;  Laterality: N/A;   REFRACTIVE SURGERY Bilateral    Right total hip replacement  4/14   SKIN CANCER DESTRUCTION     nose, ear   TONSILLECTOMY  as child   TOTAL HIP ARTHROPLASTY Left    URETHRAL DILATION  1991   [name]   Patient Active Problem List   Diagnosis Date Noted   Hand weakness 05/02/2022   Acute kidney injury ([place]) 05/02/2022   Adenomatous polyp of ascending colon    Adenomatous polyp of colon    Acute metabolic encephalopathy  28/31/5176   GI bleed 04/21/2022   Vasovagal syncope    Hemorrhagic shock (HCC)    Symptomatic anemia    Hematochezia    Acute blood loss anemia    Irregular heart beat 02/03/2022   Constipation 02/03/2022   Carpal tunnel syndrome 01/24/2022   Numbness of hand 01/24/2022   Ulnar neuropathy 01/24/2022   Skin lesion 01/16/2022   History of COVID-19 12/05/2021   Benign paroxysmal positional vertigo 09/14/2021   Palpitations 07/25/2021   Cardiac murmur 07/25/2021   History of chicken pox 07/22/2021   History of hemorrhoids 07/22/2021   Hypertension 07/22/2021   COPD (chronic obstructive pulmonary disease) ([place]) 07/22/2021   History of kidney stones 07/22/2021   History of skin cancer 16/03/3709   Complication of anesthesia 07/22/2021   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[place]) 07/22/2021   Other abnormal glucose 07/22/2021   Spondylitis ([place]) 07/22/2021   Greater trochanteric pain syndrome 07/22/2021   Vertigo 07/08/2021   Hypokalemia 07/08/2021   Trochanteric bursitis of left hip 06/30/2021   Cancer ([place]) 04/11/2021   Lumbar radiculopathy 03/02/2021   Gait instability 03/02/2021   Hip pain 03/02/2021   Situational depression 03/02/2021   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[place]) 02/09/2021   Urinary retention 11/26/2020   Cervical myelopathy ([place]) 11/24/2020   Body mass index (BMI) 25.0-25.9, adult 11/02/2020   Lumbar spondylosis 11/02/2020   Lumbago of lumbar region with sciatica 10/21/2020   Cystoid macular edema of both eyes 07/28/2020   Status post cervical spinal fusion 07/09/2020   History of total bilateral knee replacement 07/08/2020   Aortic atherosclerosis ([place]) 03/05/2020   Emphysema, unspecified ([place]) 03/05/2020   Arthritis 03/04/2020   Diverticulosis 03/03/2020   Internal hemorrhoids 03/03/2020   Weight loss 03/03/2020   Heme positive stool 02/29/2020   Leg cramps 11/13/2019   Tibialis anterior tendon tear,  nontraumatic 11/13/2019   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 07/29/2018   CPAP (continuous positive airway pressure) dependence 03/25/2018   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   Erectile dysfunction 06/20/2017   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 03/06/2017   Peripheral neuropathy 06/19/2016   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   Hearing loss 02/02/2016   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   Allergic rhinitis 02/02/2016   Atypical  chest pain 11/24/2015   Preventative health care 11/24/2015   Onychomycosis 06/30/2015   Degenerative disc disease, lumbar 06/30/2015   Other allergic rhinitis 02/18/2015   Deviated nasal septum 02/18/2015   RLS (restless legs syndrome) 02/18/2015   GERD (gastroesophageal reflux disease) 09/18/2014   Hyperglycemia 03/03/2014   Rotator cuff tear 07/27/2013   Sleep apnea with use of continuous positive airway pressure (CPAP) 07/15/2013   Carotid stenosis 02/24/2013   Nonspecific abnormal electrocardiogram (ECG) (EKG) 01/06/2013   DJD (degenerative joint disease) of hip 12/24/2012   Lower GI bleed 12/12/2012   Right groin pain 11/29/2012   OSA (obstructive sleep apnea) 11/25/2012   Ventral hernia 07/08/2012   High risk medication use 01/18/2012   Dyspnea 06/12/2011   Hyperlipidemia with target LDL less than 130 12/27/2010   Osteoarthrosis, unspecified whether generalized or localized, pelvic region and thigh 07/25/2010   Osteoarthrosis, hand 06/13/2010   Anemia 12/17/2009   Hypothyroidism 05/13/2009   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   Benign essential hypertension 05/13/2009   Right inguinal hernia 05/13/2009   HIATAL HERNIA 05/13/2009   NEPHROLITHIASIS 05/13/2009   ROSACEA 05/13/2009    PCP: [name], NP  REFERRING PROVIDER: [name], NP  REFERRING DIAG: R26.81 (ICD-10-CM) - Gait instability   THERAPY DIAG:  Unsteadiness on feet  Other abnormalities of gait and  mobility  Muscle weakness (generalized)  Repeated falls  Other low back pain  RATIONALE FOR EVALUATION AND TREATMENT:  Rehabilitation  ONSET DATE:  Fall 01/12/22 (2 falls in past 6 months)  SUBJECTIVE:   SUBJECTIVE STATEMENT: Pt reports his R leg feels weak today.   PAIN:  Are you having pain? Yes: NPRS scale: intermittent 2/10 Pain location: R & midline low back Pain description: aching, pulling, aggravating Aggravating factors: walking, bending over Relieving factors: heat  PERTINENT HISTORY:  Aortic atherosclerosis; carotid stenosis; cervical myelopathy s/p ACDF 06/2020 and posterior cervical fusion 11/2020; peripheral neuropathy; lumbar DDD/spondylosis; lumbago with sciatica/lumbar radiculopathy; OA; B TKA; B THA; L greater trochanteric pain syndrome; L RCR 2014; recent CTR surgery - early May; hearing loss; HTN; BPPV/vertigo; B sacroiliitis; skin cancer; COPD/emphysema; sleep apnea with CPAP; GERD; gout; HLD; LE cramps; RLS  PRECAUTIONS: None  WEIGHT BEARING RESTRICTIONS: No  FALLS: Has patient fallen in last 6 months? Yes. 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 No acute intracranial abnormality.  2. No acute fracture or traumatic subluxation of the  cervical spine. L wrist x-ray: 1. Mild dorsal wrist soft tissue swelling with small ossific density overlying the proximal carpal row. Correlate for triquetral fracture.  2. Moderate scaphotrapeziotrapezoid and mild first CMC joint osteoarthritis. L elbow x-ray: 1. No acute fracture or dislocation, left elbow.  2. Mild elevation of the anterior fat pad suggestive of a small joint effusion,  which may be reactive in the setting of elbow osteoarthritis. L shoulder x-rays: No acute osseous abnormality.  COGNITION: Overall cognitive status: Within functional limits for tasks assessed and Impaired: Memory: Impaired: Short term Procedural   SENSATION: Peripheral neuropathy in B feet  COORDINATION: Mildly impaired with slow movements  EDEMA:  Mild to moderate edema in distal B LE  MUSCLE LENGTH: Hamstrings: mod tight R>L ITB: mod tight B Piriformis: mod tight L>R Hip flexors: mod tight B Quads: mod tight B Heelcord: mod tight B  POSTURE:  rounded shoulders, forward head, and flexed trunk   LOWER EXTREMITY MMT:   Tested in sitting on eval 02/22/22, 03/22/22, 04/17/22, 05/04/22, 926/23 & 06/20/22 MMT Right 02/22/22 Left 02/22/22 Right 03/22/22 Left 03/22/22 Right 04/17/22 Left  04/17/22 Right  05/04/22 Left  05/04/22 Right  06/06/22 Left  06/06/22 Right 06/20/22 Left 06/20/22  Hip flexion 3+ 4- 3+ 4- 4- 4- 3+ 3+ 4- 4 4- 4  Hip extension 3- 3- 3+ 3+ 3+ 3+ 3 3 3+ 4- 4- 4-  Hip abduction 4- 4 4 4+ 4 4+ 4- 4 4- 4 4 4+  Hip adduction _0 4+ 4+ 4+ _1 4+ 4+ 4+  Hip internal rotation 3 3+ 3 3+ 3 3+ 3- 3 3+ 4- 4- 4  Hip external rotation 3+ 3+ 3+ 3+ 3+ 3+ 3 3+ 3+ 4- 3+ 4-  Knee flexion 4- 4- 4 4 4+ 4+ 4- 4- 4 4+ 4+ 4+  Knee extension 4- 4 4 4+ 4+ 4+ 4 4 4+ 4+ 4+ 4+  Ankle dorsiflexion 4- 4 4- 4 4 4- 4- 4- 4- _2 Ankle plantarflexion 3+  6 SLS HR 4 10 SLS HR 4+ 20 SLS HR Limited lift on last few reps 5 20 SLS HR 5- 18 SLS HR 5 20 SLS HR 4- 7 SLS HR Pt noting increased proximal LE pain  4+ 15 SLS HR 5- 20 SLS HR Limited lift on last few reps 5 20 SLS HR 5- 5  Ankle inversion 3+ 4- 3+ 4- 4- 4- 4- 4- 4- _3 Ankle eversion 3+ 4- 3+ 4- 4- 4- 3+ 3+ 4- _4 (Blank rows = not tested)  BED MOBILITY:  Sit to supine SBA Supine to sit SBA  TRANSFERS: Assistive device utilized: Single point cane and None  Sit to stand: Modified independence Stand to sit: Modified independence Chair to chair: Modified independence Floor:  NT  GAIT: Gait pattern: step through pattern, decreased stride length, decreased hip/knee flexion- Right, decreased hip/knee flexion- Left, decreased trunk rotation, and wide BOS Distance walked: 80 Assistive device utilized: Single point cane Level of assistance: SBA Comments: Gait speed: 2.10 ft/sec with SPC, 2.31 ft/sec w/o AD  RAMP: Level of Assistance:  NT Assistive device utilized:    Ramp Comments:   CURB:  Level of Assistance:  NT Assistive device utilized:    Curb Comments:   STAIRS:  Level of Assistance:  NT  Stair Negotiation Technique:   Number of Stairs:    Height of Stairs:   Comments:   FUNCTIONAL TESTS:  5 times sit to stand: 13.0 sec Timed up and go (TUG): 13.53 sec with SPC; 14.19 sec w/o AD 10 meter walk test: 15.60 sec with SPC; 13.04 sec w/o AD Berg Balance Scale: 39/56 - scores 37-45 indicate significant risk for falls (>80%) Dynamic Gait Index: 11/24; Scores of 19 or less are predictive of falls in older community  living adults  PATIENT SURVEYS:  ABC scale 55% of self confidence; 45% impairment  TODAY'S TREATMENT:   06/29/22 THERAPEUTIC EXERCISE: to improve flexibility, strength and mobility.  Verbal and tactile cues throughout for technique. NuStep - L7 x 7 min (UE/LE) Seated on dynadisc with feet resting on Airex pad:  - RTB alt hip ABD/ER clam 10 x 3"  - RTB hip flexion march 10 x 3"  - Alt LAQ x 10  - RTB B row 10 x 5"  - RTB B scap retraction + shoulder extension to neutral 10 x 5"  - B RTB pallof  press 10 x 3"  GAIT TRAINING: To normalize gait pattern and improve safety with RW . 450 ft with RW - cues for upright posture and RW proximity (guidance also provided on how to ensure RW adjusted to proper height as well as safety during transfers)   06/27/22 Therapeutic Exercise: Nustep L6x19mn Seated row 25# 2x10 Alt toe taps on 9' stool x 10 bil Step up and over foam roll x 15 bil - 1HA Standing shoulder extension 15# x 10 bil Standing lat pull 20# x 15  Seated hip hinges x 10 Standing step ups x 10 bil - 2HA   06/20/22 THERAPEUTIC EXERCISE: to improve flexibility, strength and mobility.  Verbal and tactile cues throughout for technique. NuStep - L7 x 6 min (UE/LE)  THERAPEUTIC ACTIVITIES: ABC scale: 730 / 1600 = 45.6% Berg: 48/56; 46-51 moderate fall risk (>50%)  10MWT = 11.50 sec with SPC; 10.06 sec w/o AD Gait speed: 2.85 ft/sec with SPC; 3.26 ft/sec w/o AD DGI: 18/24; Scores of 19 or less are predictive of falls in older community living adults   PATIENT EDUCATION: Education details: progress with PT and ongoing PT POC Person educated: Patient Education method: Explanation Education comprehension: verbalized understanding   HOME EXERCISE PROGRAM: Access Code: [email or web address]: [email or web address] Date: 03/22/2022 Prepared by: JAnnie Paras Exercises - Standing Gastroc Stretch at Counter  - 1-2 x daily - 7 x weekly - 2-3 reps - 30 sec hold - Standing Soleus Stretch at Counter  - 1-2 x daily - 7 x weekly - 2-3 reps - 30 sec hold - Hip Flexor Stretch on Step  - 1-2 x daily - 7 x weekly - 2-3 reps - 30 sec hold - Side Stepping with Resistance at Ankles and Counter Support  - 1 x daily - 7 x weekly - 2 sets - 10 reps - Forward and Backward Step Over with Counter Support  - 1 x daily - 7 x weekly - 2 sets - 10 reps - Turning in Corner 360  - 1 x daily - 7 x weekly - 2 sets - 5 reps - Tandem Stance in Corner  - 1 x daily - 7 x weekly - 2 reps - 30 sec  hold - Single Leg Stance with Support  - 1 x daily - 7 x weekly - 2 sets - 30 sec hold - Squat with Chair Touch  - 1 x daily - 7 x weekly - 2 sets - 10 reps - 3 sec hold - Seated Hamstring Stretch  - 1-2 x daily - 7 x weekly - 2 sets - 30 sec hold - Seated Hip Adductor Stretch  - 1-2 x daily - 7 x weekly - 3 reps - 30 sec hold - Seated Figure 4 Piriformis Stretch  - 1-2 x daily - 7 x weekly - 2 sets - 30 sec hold - Supine Piriformis  Stretch with Foot on Ground  - 1-2 x daily - 7 x weekly - 2-3 reps - 30 sec hold - Supine Lower Trunk Rotation  - 1 x daily - 7 x weekly - 2 sets - 30 sec hold - Bent Knee Fallouts with Alternating Legs  - 1 x daily - 7 x weekly - 2 sets - 10 reps - 3 sec hold - Seated Hip Abduction with Resistance  - 1 x daily - 3-4 x weekly - 2 sets - 10 reps - 3-5 sec hold - Seated Single Leg Hip Abduction with Resistance  - 1 x daily - 3-4 x weekly - 2 sets - 10 reps - 3 sec hold - Seated March with Resistance  - 1 x daily - 3-4 x weekly - 2 sets - 10 reps - 3 sec hold - Seated Shoulder Row with Anchored Resistance  - 1 x daily - 3-4 x weekly - 2 sets - 10 reps - 3-5 hold hold  Patient Education - Check for Safety   ASSESSMENT:  CLINICAL IMPRESSION: [name] continues to note R-sided LE weakness with gait instability and dependence on SPC. Given upcoming low back surgery, suggested that he might be safer/steadier using a RW at least temporarily post-op, therefore reviewed RW height set-up as well as safety with transfers and gait using RW. He reports he loaned his RW to a neighbor but will look into getting it back vs obtaining a new RW. Pt nearing the end of current POC and is scheduled for lumbar surgery on 07/12/22, therefore will plan for review and consolidation of his HEP over remaining visits targeting exercises which will be safe for his to continue post-op.   OBJECTIVE IMPAIRMENTS Abnormal gait, decreased activity tolerance, decreased balance, decreased endurance, decreased  knowledge of condition, decreased knowledge of use of DME, decreased mobility, difficulty walking, decreased ROM, decreased strength, decreased safety awareness, increased fascial restrictions, impaired perceived functional ability, increased muscle spasms, impaired flexibility, improper body mechanics, postural dysfunction, and pain.   ACTIVITY LIMITATIONS carrying, lifting, bending, standing, squatting, stairs, transfers, and locomotion level  PARTICIPATION LIMITATIONS: cleaning, laundry, interpersonal relationship, community activity, and yard work  PERSONAL FACTORS Age, Fitness, Past/current experiences, Time since onset of injury/illness/exacerbation, and 3+ comorbidities: Aortic atherosclerosis; carotid stenosis; cervical myelopathy s/p ACDF 06/2020 and posterior cervical fusion 11/2020; peripheral neuropathy; lumbar DDD/spondylosis; lumbago with sciatica/lumbar radiculopathy; OA; B TKA; BTHA; L greater trochanteric pain syndrome; L RCR 2014; recent CTR surgery - early May; hearing loss; HTN; BPPV/vertigo; B sacroiliitis; skin cancer; COPD/emphysema; sleep apnea with CPAP; GERD; gout; HLD; LE cramps; RLS  are also affecting patient's functional outcome.   REHAB POTENTIAL: Good  CLINICAL DECISION MAKING: Evolving/moderate complexity  EVALUATION COMPLEXITY: Moderate  GOALS: Goals reviewed with patient? Yes  SHORT TERM GOALS: Target date: 03/22/2022, extended to 06/01/22   Patient will be independent with initial HEP. Baseline:  Goal status: MET  03/22/22  2.  Patient will demonstrate decreased fall risk by scoring > 45/56 on Berg. Baseline: 39/56 (eval - 02/22/22); 43/56 (03/22/22); 48/56 (04/17/22); decreased to 40/56 (05/04/22) following hospitalization; 43/56 (06/06/22) Goal status: MET  06/20/22 - Increased to 48/56  3.  Patient will be educated on strategies to decrease risk of falls.  Baseline:  Goal status: MET  03/22/22 - Education provided last visit - Pt reports no concerns as he went  through the home checklist other than the bathroom mat non-skid backing has disintegrated from repeated washing (he states he pushes it to the side  while he is in the BR)   LONG TERM GOALS: Target date: 04/19/2022, extended to 05/29/2022, then 07/10/22   Patient will be independent with advanced/ongoing HEP to improve outcomes and carryover.  Baseline:  Goal status: IN PROGRESS  05/29/22 - Pt instructed to rotate through HEP exercise in smaller groups to reduce fatigue and allow for recovery time.  2.  Patient will be able to ambulate 600' with LRAD with good safety to access community.  Baseline:  Goal status: IN PROGRESS  3.  Patient will be able to step up/down curb safely with LRAD for safety with community ambulation.  Baseline:  Goal status: MET  04/17/22  4.  Patient will demonstrate improved functional LE strength as demonstrated by overall MMT >/= 4 to 4+/5. Baseline:  Goal status: IN PROGRESS  06/06/22 - Slowly improving but R weaker than L (refer to MMT chart)  5.  Patient will demonstrate at least 19/24 on DGI to improve gait stability and reduce risk for falls. Baseline: 11/24 (eval); 16/24 (03/31/22); 18/24 (04/17/22); 12/24 (05/04/22); 14/24 (06/06/22); 18/24 (06/20/22) Goal status: IN PROGRESS  10/10//23 - Increased to 18/24   6.  Patient will score >/= 52/56 on Berg Balance test to demonstrate lower risk of falls. (MCID= 8 points) .  Baseline: 39/56 (eval - 02/22/22); 43/56 (03/22/22); 48/56 (04/17/22); 40/56 (05/04/22); 43/56 (06/06/22); 48/56 (06/20/22) Goal status: IN PROGRESS  06/20/22 - Increased to 48/56  7.  Patient will report >67% of self confidence on ABC scale to demonstrate improved functional ability. Baseline: 55% self confidence (eval); 53.8 % (04/17/22); 45.6% (06/20/22) Goal status: IN PROGRESS  8.  Patient will demonstrate gait speed of >/= 2.62 ft/sec (0.80 m/s) to be a safe community ambulator with decreased risk for recurrent falls.  Baseline: 2.10 ft/sec with SPC,  2.31 ft/sec w/o AD (eval); 2.90 ft/sec with SPC; 2.92 ft/sec w/o AD (03/31/22); 2.99 ft/sec with SPC; 3.14 ft/sec w/o AD (04/17/22); 2.77 ft/sec with SPC; 3.09 ft/sec w/o AD but much more unsteady (05/04/22);  2.71 ft/sec with SPC; 3.16 ft/sec w/o AD (06/06/22); 2.85 ft/sec with SPC; 3.26 ft/sec w/o AD (06/20/22) Goal status: MET  03/31/22   PLAN: PT FREQUENCY: 2x/week  PT DURATION: 7-8 weeks  PLANNED INTERVENTIONS: Therapeutic exercises, Therapeutic activity, Neuromuscular re-education, Balance training, Gait training, Patient/Family education, Joint mobilization, Stair training, Vestibular training, Canalith repositioning, Dry Needling, Electrical stimulation, Spinal manipulation, Cryotherapy, Moist heat, Taping, Ultrasound, Ionotophoresis 39m/ml Dexamethasone, Manual therapy, and Re-evaluation  PLAN FOR NEXT SESSION:  HEP review/update/consolidation in prep for D/C (scheduled for back surgery on 07/12/22); lumbopelvic/LE flexibility and strengthening; balance training with close guarding    JPercival Spanish PT 06/29/2022, 3:11 PM   PHYSICAL THERAPY DISCHARGE SUMMARY  Visits from Start of Care: 25  Current functional level related to goals / functional outcomes:   Refer to above clinical impression and goal assessment for status as of last visit on 06/29/2022. 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  He was discharged from acute care to skilled nursing facility for rehab, therefore will proceed with discharge from PT for this episode.      Remaining deficits:   As above. Unable to formally assess status at discharge due to inability to return to PT.    Education / Equipment:   HEP, Fall prevention - "Check for Safety - Home Fall Prevention Checklist for Older Adults"    Patient agrees to discharge.  Patient goals were partially met. Patient is being discharged due to a change in medical  status.  Percival Spanish, PT, MPT 08/09/22, 9:57 AM  [facility] [address]  [place], [place], [address] Phone: [phone]   Fax:  [phone]

## 2022-06-30 DIAGNOSIS — W19XXXA Unspecified fall, initial encounter: Secondary | ICD-10-CM | POA: Diagnosis not present

## 2022-06-30 DIAGNOSIS — R609 Edema, unspecified: Secondary | ICD-10-CM | POA: Diagnosis not present

## 2022-06-30 DIAGNOSIS — I10 Essential (primary) hypertension: Secondary | ICD-10-CM | POA: Diagnosis not present

## 2022-06-30 DIAGNOSIS — R58 Hemorrhage, not elsewhere classified: Secondary | ICD-10-CM | POA: Diagnosis not present

## 2022-07-01 DIAGNOSIS — K648 Other hemorrhoids: Secondary | ICD-10-CM | POA: Diagnosis not present

## 2022-07-01 DIAGNOSIS — W228XXA Striking against or struck by other objects, initial encounter: Secondary | ICD-10-CM | POA: Diagnosis not present

## 2022-07-01 DIAGNOSIS — D649 Anemia, unspecified: Secondary | ICD-10-CM | POA: Diagnosis not present

## 2022-07-01 DIAGNOSIS — J449 Chronic obstructive pulmonary disease, unspecified: Secondary | ICD-10-CM | POA: Diagnosis not present

## 2022-07-01 DIAGNOSIS — I440 Atrioventricular block, first degree: Secondary | ICD-10-CM | POA: Diagnosis not present

## 2022-07-01 DIAGNOSIS — R531 Weakness: Secondary | ICD-10-CM | POA: Diagnosis not present

## 2022-07-01 DIAGNOSIS — Z9181 History of falling: Secondary | ICD-10-CM | POA: Diagnosis not present

## 2022-07-01 DIAGNOSIS — Y998 Other external cause status: Secondary | ICD-10-CM | POA: Diagnosis not present

## 2022-07-01 DIAGNOSIS — E039 Hypothyroidism, unspecified: Secondary | ICD-10-CM | POA: Diagnosis not present

## 2022-07-01 DIAGNOSIS — E785 Hyperlipidemia, unspecified: Secondary | ICD-10-CM | POA: Diagnosis not present

## 2022-07-01 DIAGNOSIS — W19XXXA Unspecified fall, initial encounter: Secondary | ICD-10-CM | POA: Diagnosis not present

## 2022-07-01 DIAGNOSIS — T84031A Mechanical loosening of internal left hip prosthetic joint, initial encounter: Secondary | ICD-10-CM | POA: Diagnosis not present

## 2022-07-01 DIAGNOSIS — I10 Essential (primary) hypertension: Secondary | ICD-10-CM | POA: Diagnosis not present

## 2022-07-01 DIAGNOSIS — S72142A Displaced intertrochanteric fracture of left femur, initial encounter for closed fracture: Secondary | ICD-10-CM | POA: Diagnosis not present

## 2022-07-01 DIAGNOSIS — K5790 Diverticulosis of intestine, part unspecified, without perforation or abscess without bleeding: Secondary | ICD-10-CM | POA: Diagnosis not present

## 2022-07-01 DIAGNOSIS — Z96642 Presence of left artificial hip joint: Secondary | ICD-10-CM | POA: Diagnosis not present

## 2022-07-01 DIAGNOSIS — S72002A Fracture of unspecified part of neck of left femur, initial encounter for closed fracture: Secondary | ICD-10-CM | POA: Diagnosis not present

## 2022-07-01 DIAGNOSIS — Z96652 Presence of left artificial knee joint: Secondary | ICD-10-CM | POA: Diagnosis not present

## 2022-07-01 DIAGNOSIS — G8929 Other chronic pain: Secondary | ICD-10-CM | POA: Diagnosis not present

## 2022-07-01 DIAGNOSIS — Z96641 Presence of right artificial hip joint: Secondary | ICD-10-CM | POA: Diagnosis not present

## 2022-07-01 DIAGNOSIS — H905 Unspecified sensorineural hearing loss: Secondary | ICD-10-CM | POA: Diagnosis not present

## 2022-07-01 DIAGNOSIS — M9702XA Periprosthetic fracture around internal prosthetic left hip joint, initial encounter: Secondary | ICD-10-CM | POA: Diagnosis not present

## 2022-07-01 DIAGNOSIS — M549 Dorsalgia, unspecified: Secondary | ICD-10-CM | POA: Diagnosis not present

## 2022-07-01 DIAGNOSIS — M9702XD Periprosthetic fracture around internal prosthetic left hip joint, subsequent encounter: Secondary | ICD-10-CM | POA: Diagnosis not present

## 2022-07-01 DIAGNOSIS — K222 Esophageal obstruction: Secondary | ICD-10-CM | POA: Diagnosis not present

## 2022-07-01 DIAGNOSIS — I454 Nonspecific intraventricular block: Secondary | ICD-10-CM | POA: Diagnosis not present

## 2022-07-01 DIAGNOSIS — M978XXA Periprosthetic fracture around other internal prosthetic joint, initial encounter: Secondary | ICD-10-CM | POA: Diagnosis not present

## 2022-07-01 DIAGNOSIS — S72092A Other fracture of head and neck of left femur, initial encounter for closed fracture: Secondary | ICD-10-CM | POA: Diagnosis not present

## 2022-07-01 DIAGNOSIS — S51019A Laceration without foreign body of unspecified elbow, initial encounter: Secondary | ICD-10-CM | POA: Diagnosis not present

## 2022-07-01 DIAGNOSIS — Z743 Need for continuous supervision: Secondary | ICD-10-CM | POA: Diagnosis not present

## 2022-07-01 DIAGNOSIS — Z043 Encounter for examination and observation following other accident: Secondary | ICD-10-CM | POA: Diagnosis not present

## 2022-07-01 DIAGNOSIS — M109 Gout, unspecified: Secondary | ICD-10-CM | POA: Diagnosis not present

## 2022-07-01 DIAGNOSIS — I700 Atherosclerosis of aorta: Secondary | ICD-10-CM | POA: Diagnosis not present

## 2022-07-02 DIAGNOSIS — M549 Dorsalgia, unspecified: Secondary | ICD-10-CM | POA: Diagnosis not present

## 2022-07-02 DIAGNOSIS — T84031A Mechanical loosening of internal left hip prosthetic joint, initial encounter: Secondary | ICD-10-CM | POA: Diagnosis not present

## 2022-07-02 DIAGNOSIS — Z96642 Presence of left artificial hip joint: Secondary | ICD-10-CM | POA: Diagnosis not present

## 2022-07-02 DIAGNOSIS — S72002A Fracture of unspecified part of neck of left femur, initial encounter for closed fracture: Secondary | ICD-10-CM | POA: Diagnosis not present

## 2022-07-02 DIAGNOSIS — I440 Atrioventricular block, first degree: Secondary | ICD-10-CM | POA: Diagnosis not present

## 2022-07-02 DIAGNOSIS — M9702XA Periprosthetic fracture around internal prosthetic left hip joint, initial encounter: Secondary | ICD-10-CM | POA: Diagnosis not present

## 2022-07-02 DIAGNOSIS — I454 Nonspecific intraventricular block: Secondary | ICD-10-CM | POA: Diagnosis not present

## 2022-07-03 DIAGNOSIS — S72002A Fracture of unspecified part of neck of left femur, initial encounter for closed fracture: Secondary | ICD-10-CM | POA: Diagnosis not present

## 2022-07-03 DIAGNOSIS — M9702XA Periprosthetic fracture around internal prosthetic left hip joint, initial encounter: Secondary | ICD-10-CM | POA: Diagnosis not present

## 2022-07-03 DIAGNOSIS — M549 Dorsalgia, unspecified: Secondary | ICD-10-CM | POA: Diagnosis not present

## 2022-07-04 DIAGNOSIS — M549 Dorsalgia, unspecified: Secondary | ICD-10-CM | POA: Diagnosis not present

## 2022-07-04 DIAGNOSIS — M9702XA Periprosthetic fracture around internal prosthetic left hip joint, initial encounter: Secondary | ICD-10-CM | POA: Diagnosis not present

## 2022-07-04 DIAGNOSIS — G8929 Other chronic pain: Secondary | ICD-10-CM | POA: Diagnosis not present

## 2022-07-04 DIAGNOSIS — Z96642 Presence of left artificial hip joint: Secondary | ICD-10-CM | POA: Diagnosis not present

## 2022-07-04 NOTE — Progress Notes (Signed)
Surgical Instructions    Your procedure is scheduled on Wednesday November 1st.  Report to [facility] Main Entrance "A" at 630 A.M., then check in with the Admitting office.  Call this number if you have problems the morning of surgery:  [phone]   If you have any questions prior to your surgery date call [phone]: Open Monday-Friday 8am-4pm If you experience any cold or flu symptoms such as cough, fever, chills, shortness of breath, etc. between now and your scheduled surgery, please notify us at the above number     Remember:  Do not eat or drink after midnight the night before your surgery     Take these medicines the morning of surgery with A SIP OF WATER:  allopurinol (ZYLOPRIM) 100 MG tablet amLODipine (NORVASC) 2.5 MG tablet cycloSPORINE (RESTASIS) 0.05 % ophthalmic emulsion GEMTESA 75 MG TABS hydrALAZINE (APRESOLINE) 50 MG tablet levothyroxine (SYNTHROID) rosuvastatin (CRESTOR) 5 MG tablet   IF NEEDED acetaminophen (TYLENOL) 650 MG CR tablet albuterol (VENTOLIN HFA) 108 (90 Base) MCG/ACT inhaler - bring with you to the hospital nitroGLYCERIN (NITROSTAT) 0.4 MG SL tablet ondansetron (ZOFRAN-ODT) 4 MG disintegrating tablet polyvinyl alcohol (ARTIFICIAL TEARS) 1.4 % ophthalmic solution sodium chloride (OCEAN) 0.65 % nasal spray  Follow your surgeon's instructions on when to stop Aspirin.  If no instructions were given by your surgeon then you will need to call the office to get those instructions.     As of today, STOP taking any Aspirin (unless otherwise instructed by your surgeon) Voltaren, Aleve, Naproxen, Ibuprofen, Motrin, Advil, Goody's, BC's, all herbal medications, fish oil, and all vitamins.           Do not wear jewelry  Do not wear lotions, powders, cologne or deodorant. Do not shave 48 hours prior to surgery.  Men may shave face and neck. Do not bring valuables to the hospital. Do not wear nail polish  [facility] is not responsible for any  belongings or valuables.    Do NOT Smoke (Tobacco/Vaping)  24 hours prior to your procedure  If you use a CPAP at night, you may bring your mask for your overnight stay.   Contacts, glasses, hearing aids, dentures or partials may not be worn into surgery, please bring cases for these belongings   For patients admitted to the hospital, discharge time will be determined by your treatment team.   Patients discharged the day of surgery will not be allowed to drive home, and someone needs to stay with them for 24 hours.   SURGICAL WAITING ROOM VISITATION Patients having surgery or a procedure may have no more than 2 support people in the waiting area - these visitors may rotate.   Children under the age of 24 must have an adult with them who is not the patient. If the patient needs to stay at the hospital during part of their recovery, the visitor guidelines for inpatient rooms apply. Pre-op nurse will coordinate an appropriate time for 1 support person to accompany patient in pre-op.  This support person may not rotate.   Please refer to [email or web address] for the visitor guidelines for Inpatients (after your surgery is over and you are in a regular room).    Special instructions:    Oral Hygiene is also important to reduce your risk of infection.  Remember - BRUSH YOUR TEETH THE MORNING OF SURGERY WITH YOUR REGULAR TOOTHPASTE   [facility]- Preparing For Surgery  Before surgery, you can play an important role. Because skin is not  sterile, your skin needs to be as free of germs as possible. You can reduce the number of germs on your skin by washing with CHG (chlorahexidine gluconate) Soap before surgery.  CHG is an antiseptic cleaner which kills germs and bonds with the skin to continue killing germs even after washing.     Please do not use if you have an allergy to CHG or antibacterial soaps. If your skin becomes reddened/irritated stop  using the CHG.  Do not shave (including legs and underarms) for at least 48 hours prior to first CHG shower. It is OK to shave your face.  Please follow these instructions carefully.     Shower the NIGHT BEFORE SURGERY and the MORNING OF SURGERY with CHG Soap.   If you chose to wash your hair, wash your hair first as usual with your normal shampoo. After you shampoo, rinse your hair and body thoroughly to remove the shampoo.  Then [employer] and genitals (private parts) with your normal soap and rinse thoroughly to remove soap.  After that Use CHG Soap as you would any other liquid soap. You can apply CHG directly to the skin and wash gently with a scrungie or a clean washcloth.   Apply the CHG Soap to your body ONLY FROM THE NECK DOWN.  Do not use on open wounds or open sores. Avoid contact with your eyes, ears, mouth and genitals (private parts). Wash Face and genitals (private parts)  with your normal soap.   Wash thoroughly, paying special attention to the area where your surgery will be performed.  Thoroughly rinse your body with warm water from the neck down.  DO NOT shower/wash with your normal soap after using and rinsing off the CHG Soap.  Pat yourself dry with a CLEAN TOWEL.  Wear CLEAN PAJAMAS to bed the night before surgery  Place CLEAN SHEETS on your bed the night before your surgery  DO NOT SLEEP WITH PETS.   Day of Surgery:  Take a shower with CHG soap. Wear Clean/Comfortable clothing the morning of surgery Do not apply any deodorants/lotions.   Remember to brush your teeth WITH YOUR REGULAR TOOTHPASTE.    If you received a COVID test during your pre-op visit, it is requested that you wear a mask when out in public, stay away from anyone that may not be feeling well, and notify your surgeon if you develop symptoms. If you have been in contact with anyone that has tested positive in the last 10 days, please notify your surgeon.    Please read over the following  fact sheets that you were given.

## 2022-07-05 ENCOUNTER — Inpatient Hospital Stay (HOSPITAL_COMMUNITY): Admission: RE | Admit: 2022-07-05 | Payer: Medicare HMO | Source: Ambulatory Visit

## 2022-07-05 DIAGNOSIS — H905 Unspecified sensorineural hearing loss: Secondary | ICD-10-CM | POA: Diagnosis not present

## 2022-07-05 DIAGNOSIS — K5790 Diverticulosis of intestine, part unspecified, without perforation or abscess without bleeding: Secondary | ICD-10-CM | POA: Diagnosis not present

## 2022-07-05 DIAGNOSIS — K648 Other hemorrhoids: Secondary | ICD-10-CM | POA: Diagnosis not present

## 2022-07-05 DIAGNOSIS — M9702XA Periprosthetic fracture around internal prosthetic left hip joint, initial encounter: Secondary | ICD-10-CM | POA: Diagnosis not present

## 2022-07-05 DIAGNOSIS — D649 Anemia, unspecified: Secondary | ICD-10-CM | POA: Diagnosis not present

## 2022-07-05 DIAGNOSIS — Z96642 Presence of left artificial hip joint: Secondary | ICD-10-CM | POA: Diagnosis not present

## 2022-07-06 ENCOUNTER — Ambulatory Visit: Payer: Medicare HMO | Admitting: Physical Therapy

## 2022-07-06 DIAGNOSIS — R7989 Other specified abnormal findings of blood chemistry: Secondary | ICD-10-CM | POA: Diagnosis not present

## 2022-07-06 DIAGNOSIS — G8929 Other chronic pain: Secondary | ICD-10-CM | POA: Diagnosis not present

## 2022-07-06 DIAGNOSIS — H04123 Dry eye syndrome of bilateral lacrimal glands: Secondary | ICD-10-CM | POA: Diagnosis not present

## 2022-07-06 DIAGNOSIS — R2681 Unsteadiness on feet: Secondary | ICD-10-CM | POA: Diagnosis not present

## 2022-07-06 DIAGNOSIS — Z743 Need for continuous supervision: Secondary | ICD-10-CM | POA: Diagnosis not present

## 2022-07-06 DIAGNOSIS — R531 Weakness: Secondary | ICD-10-CM | POA: Diagnosis not present

## 2022-07-06 DIAGNOSIS — K5900 Constipation, unspecified: Secondary | ICD-10-CM | POA: Diagnosis not present

## 2022-07-06 DIAGNOSIS — H905 Unspecified sensorineural hearing loss: Secondary | ICD-10-CM | POA: Diagnosis not present

## 2022-07-06 DIAGNOSIS — F4320 Adjustment disorder, unspecified: Secondary | ICD-10-CM | POA: Diagnosis not present

## 2022-07-06 DIAGNOSIS — G4733 Obstructive sleep apnea (adult) (pediatric): Secondary | ICD-10-CM | POA: Diagnosis not present

## 2022-07-06 DIAGNOSIS — W19XXXA Unspecified fall, initial encounter: Secondary | ICD-10-CM | POA: Diagnosis not present

## 2022-07-06 DIAGNOSIS — Z9181 History of falling: Secondary | ICD-10-CM | POA: Diagnosis not present

## 2022-07-06 DIAGNOSIS — E785 Hyperlipidemia, unspecified: Secondary | ICD-10-CM | POA: Diagnosis not present

## 2022-07-06 DIAGNOSIS — D649 Anemia, unspecified: Secondary | ICD-10-CM | POA: Diagnosis not present

## 2022-07-06 DIAGNOSIS — N189 Chronic kidney disease, unspecified: Secondary | ICD-10-CM | POA: Diagnosis not present

## 2022-07-06 DIAGNOSIS — Z4789 Encounter for other orthopedic aftercare: Secondary | ICD-10-CM | POA: Diagnosis not present

## 2022-07-06 DIAGNOSIS — R52 Pain, unspecified: Secondary | ICD-10-CM | POA: Diagnosis not present

## 2022-07-06 DIAGNOSIS — R54 Age-related physical debility: Secondary | ICD-10-CM | POA: Diagnosis not present

## 2022-07-06 DIAGNOSIS — E039 Hypothyroidism, unspecified: Secondary | ICD-10-CM | POA: Diagnosis not present

## 2022-07-06 DIAGNOSIS — M109 Gout, unspecified: Secondary | ICD-10-CM | POA: Diagnosis not present

## 2022-07-06 DIAGNOSIS — I10 Essential (primary) hypertension: Secondary | ICD-10-CM | POA: Diagnosis not present

## 2022-07-06 DIAGNOSIS — S7292XA Unspecified fracture of left femur, initial encounter for closed fracture: Secondary | ICD-10-CM | POA: Diagnosis not present

## 2022-07-06 DIAGNOSIS — M9702XD Periprosthetic fracture around internal prosthetic left hip joint, subsequent encounter: Secondary | ICD-10-CM | POA: Diagnosis not present

## 2022-07-07 ENCOUNTER — Ambulatory Visit: Payer: Medicare HMO | Admitting: Family

## 2022-07-07 DIAGNOSIS — G8929 Other chronic pain: Secondary | ICD-10-CM | POA: Diagnosis not present

## 2022-07-07 DIAGNOSIS — I10 Essential (primary) hypertension: Secondary | ICD-10-CM | POA: Diagnosis not present

## 2022-07-10 ENCOUNTER — Ambulatory Visit: Payer: Medicare HMO | Admitting: Physical Therapy

## 2022-07-10 ENCOUNTER — Telehealth: Payer: Self-pay | Admitting: Family

## 2022-07-10 DIAGNOSIS — M9702XD Periprosthetic fracture around internal prosthetic left hip joint, subsequent encounter: Secondary | ICD-10-CM | POA: Diagnosis not present

## 2022-07-10 DIAGNOSIS — N189 Chronic kidney disease, unspecified: Secondary | ICD-10-CM | POA: Diagnosis not present

## 2022-07-10 DIAGNOSIS — F4320 Adjustment disorder, unspecified: Secondary | ICD-10-CM | POA: Diagnosis not present

## 2022-07-10 DIAGNOSIS — G4733 Obstructive sleep apnea (adult) (pediatric): Secondary | ICD-10-CM | POA: Diagnosis not present

## 2022-07-10 DIAGNOSIS — S7292XA Unspecified fracture of left femur, initial encounter for closed fracture: Secondary | ICD-10-CM | POA: Diagnosis not present

## 2022-07-10 DIAGNOSIS — R531 Weakness: Secondary | ICD-10-CM | POA: Diagnosis not present

## 2022-07-10 DIAGNOSIS — R2681 Unsteadiness on feet: Secondary | ICD-10-CM | POA: Diagnosis not present

## 2022-07-10 DIAGNOSIS — W19XXXA Unspecified fall, initial encounter: Secondary | ICD-10-CM | POA: Diagnosis not present

## 2022-07-10 DIAGNOSIS — I10 Essential (primary) hypertension: Secondary | ICD-10-CM | POA: Diagnosis not present

## 2022-07-10 DIAGNOSIS — R54 Age-related physical debility: Secondary | ICD-10-CM | POA: Diagnosis not present

## 2022-07-10 DIAGNOSIS — R52 Pain, unspecified: Secondary | ICD-10-CM | POA: Diagnosis not present

## 2022-07-10 DIAGNOSIS — D649 Anemia, unspecified: Secondary | ICD-10-CM | POA: Diagnosis not present

## 2022-07-10 NOTE — Telephone Encounter (Signed)
Pt called stating that he wanted to make [name] Pratt aware of the situation with his hip fracture and wanted to speak with her when possible about next steps. Please Advise.

## 2022-07-11 NOTE — Progress Notes (Deleted)
Patient ID: [name] Pratt, male   DOB: [birth year], 81 y.o.   MRN: [id number]     CARDIOLOGY OFFICE NOTE  Date:  07/11/2022    [name] Pratt Date of Birth: August 26, 1941 Medical Record #[id number]  PCP:  [name], NP  Cardiologist:  Johnsie Cancel    No chief complaint on file.    History of Present Illness:  81 y.o. history of HLD, GERD, left bruit , HTN and COPD. No history of CAD with normal myovue in 2008, 2017 and 08/02/21 . Last TTE done 08/27/21 EF 55-60% mild MR and AV sclerosis  Duplex done June 2023 with carotid plaque no stenosis bilaterally. 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 From diverticular dx Seen by [facility] Complicated by encephalopathy and aspiration pneumonia   Needs right laminectomy L2-4 with Dr Wynona Neat ***  ***   Past Medical History:  Diagnosis Date   Allergic rhinitis 02/02/2016   Anemia 12/17/2009   Formatting of this note might be different from the original. Anemia  10/1 IMO update   Aortic atherosclerosis ([place]) 03/05/2020   Arthritis    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   Atypical chest pain 11/24/2015   Benign essential hypertension 05/13/2009   Qualifier: Diagnosis of  By: Larwance Sachs of this note might be different from the original. Hypertension   Benign paroxysmal positional vertigo 09/14/2021   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[place]) 07/22/2021   Body mass index (BMI) 25.0-25.9, adult 11/02/2020   Cancer ([place])    skin cancer   Cardiac murmur 07/25/2021   Carotid stenosis    a. Carotid U/S 5/63: LICA < 87%, RICA 56-43%;  b.  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) 02/09/2021   Gait disorder 03/02/2021   GERD (gastroesophageal reflux disease)    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   Qualifier: Diagnosis of  By: Burnett Kanaris     Greater trochanteric pain syndrome 07/22/2021   Hand pain, left 01/16/2022   Hearing loss 02/02/2016   Heme positive stool 02/29/2020   HIATAL HERNIA 05/13/2009   Qualifier: Diagnosis of  By: Burnett Kanaris     High risk medication use 01/18/2012   Hip pain 03/02/2021   History of chicken pox    History of COVID-19 12/05/2021   History of hemorrhoids    History of kidney stones    History of skin cancer 07/22/2021   skin cancer   History of total bilateral knee replacement 07/08/2020   Hyperglycemia  03/03/2014   Hyperlipidemia with target LDL less than 130 12/27/2010   Formatting of this note might be different from the original. Cardiology - Dr Frances Nickels at [facility] cardiology  ICD-10 cut over   Hypertension    under control   Hypokalemia 07/08/2021   Hypothyroidism    Internal hemorrhoids 03/03/2020   Leg cramps 11/13/2019   Lower GI bleed 12/12/2012   Lumbago of lumbar region with sciatica 10/21/2020   Lumbar radiculopathy 03/02/2021   Lumbar spondylosis 11/02/2020   NEPHROLITHIASIS 05/13/2009   Qualifier: Diagnosis of  By:  Burnett Kanaris     Nonspecific abnormal electrocardiogram (ECG) (EKG) 01/06/2013   Numbness of hand 01/24/2022   Onychomycosis 06/30/2015   Osteoarthrosis, 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 Osteoarthritis Of The Hand  10/1 IMO update   Osteoarthrosis, unspecified whether generalized or localized, pelvic region and thigh 07/25/2010   Formatting of this note might be different from the original. Osteoarthritis Of The Hip   Other abnormal glucose 07/22/2021   Other allergic rhinitis 02/18/2015   Palpitations 07/25/2021   Peripheral neuropathy 06/19/2016   Preventative health care 11/24/2015   Right groin pain 11/29/2012   Right inguinal hernia 05/13/2009   Qualifier: Diagnosis of  By: Burnett Kanaris     RLS (restless legs syndrome) 02/18/2015   Rosacea    Rotator cuff tear 07/27/2013   Situational depression 03/02/2021   Skin lesion 01/16/2022   Sleep apnea with use of continuous positive airway pressure (CPAP) 07/15/2013   CPAP set to  7 cm water,  Residual AHi was 7.8  With no leak.  User time 6 hours.  479 days , not used only 12 days - highly compliant 06-23-13  .    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   Urinary retention 11/26/2020   Ventral hernia 07/08/2012   Vertigo 07/08/2021   Weight loss 03/03/2020    Past Surgical History:  Procedure Laterality Date   ABDOMINAL HERNIA REPAIR  07/15/12   Dr [name]   ANTERIOR CERVICAL DECOMP/DISCECTOMY FUSION N/A 07/09/2020   Procedure: ANTERIOR CERVICAL DECOMPRESSION AND FUSION CERVICAL THREE-FOUR.;  Surgeon: [name], MD;  Location: [place];  Service:  Neurosurgery;  Laterality: N/A;  anterior   BROW LIFT  05/07/01   COLONOSCOPY WITH PROPOFOL N/A 04/24/2022   Procedure: COLONOSCOPY WITH PROPOFOL;  Surgeon: Thornton Park, MD;  Location: [place];  Service: Gastroenterology;  Laterality: N/A;   COLONOSCOPY WITH PROPOFOL N/A 04/26/2022   Procedure: COLONOSCOPY WITH PROPOFOL;  Surgeon: Thornton Park, MD;  Location: [place];  Service: Gastroenterology;  Laterality: N/A;   CYSTOSCOPY WITH URETEROSCOPY AND STENT PLACEMENT Right 01/28/2014   Procedure: CYSTOSCOPY WITH URETEROSCOPY, BASKET RETRIVAL AND  STENT PLACEMENT;  Surgeon: [name], MD;  Location: [facility];  Service: Urology;  Laterality: Right;   epidural injections     multiple procedures   ESOPHAGEAL DILATION  1993 and 1994   multiple times   EYE SURGERY Bilateral 03/25/10, 2012   cataract removal   HEMORRHOID SURGERY  2014   HIATAL HERNIA REPAIR  12/21/93   HOLMIUM LASER APPLICATION Right 0/17/5102   Procedure: HOLMIUM LASER APPLICATION;  Surgeon: [name], MD;  Location: [facility];  Service: Urology;  Laterality: Right;   INGUINAL HERNIA REPAIR Right 07/15/12   Dr [name], x2   JOINT REPLACEMENT  07/25/04   right knee   JOINT REPLACEMENT  07/13/10   left knee   [place]  09/20/06   with intraoperative cholangiogram and right inguinal herniorrhaphy with mesh    LIGAMENT REPAIR Left 07/2013   shoulder   LITHOTRIPSY     x2   [place]   POLYPECTOMY  04/26/2022   Procedure: POLYPECTOMY;  Surgeon: Thornton Park, MD;  Location: [place];  Service: Gastroenterology;;   POSTERIOR CERVICAL FUSION/FORAMINOTOMY N/A 11/24/2020   Procedure: Posterior Cervical Laminectomy Cervical three-four, Cervical four-five with lateral mass fusion;  Surgeon: [name], MD;  Location: [place];  Service: Neurosurgery;  Laterality: N/A;   REFRACTIVE SURGERY Bilateral    Right total hip replacement  4/14   SKIN CANCER  DESTRUCTION     nose, ear   TONSILLECTOMY  as child   TOTAL HIP ARTHROPLASTY Left    URETHRAL DILATION  1991   [name]     Medications: Current Outpatient Medications  Medication Sig Dispense Refill   acetaminophen (TYLENOL) 650 MG CR tablet Take 650-1,300 mg by mouth every 8 (eight) hours as needed for pain.     albuterol (VENTOLIN HFA) 108 (90 Base) MCG/ACT inhaler INHALE 2 PUFFS INTO THE LUNGS EVERY 6 HOURS AS NEEDED FOR SHORTNESS OF BREATH. (Patient taking differently: Inhale 2 puffs into the lungs every 6 (six) hours as needed for shortness of breath.) 1 each 3   allopurinol (ZYLOPRIM) 100 MG tablet TAKE 1 TABLET TWICE DAILY (Patient taking differently: Take 100 mg by mouth in the morning and at bedtime.) 180 tablet 0   amLODipine (NORVASC) 2.5 MG tablet Take 1 tablet (2.5 mg total) by mouth daily. 90 tablet 0   antiseptic oral rinse (BIOTENE) LIQD 15 mLs by Mouth Rinse route in the morning.     aspirin EC 81 MG tablet Take 81 mg by mouth at bedtime.     cycloSPORINE (RESTASIS) 0.05 % ophthalmic emulsion Place 1 drop into both eyes 2 (two) times daily.     diclofenac Sodium (VOLTAREN) 1 % GEL Apply 2 g topically daily as needed (for arthritic pain in the hands).     docusate sodium (COLACE) 100 MG capsule Take 1 capsule (100 mg total) by mouth 2 (two) times daily. 60 capsule 11   fluocinonide (LIDEX) 0.05 % external solution Apply 1 application  topically 2 (two) times daily as needed for itching (SCALP).     GEMTESA 75 MG TABS Take 75 mg by mouth daily.     glucose blood test strip TRUE Metrix Blood Glucose Test Strip, use as instructed 100 each 12   Homeopathic Products (THERAWORX RELIEF) FOAM Apply 1 application topically 2 (two) times daily as needed (Pain).     hydrALAZINE (APRESOLINE) 50 MG tablet Take 1 tablet (50 mg total) by mouth 2 (two) times daily. 60 tablet 2   Lancets MISC 1 each by Does not apply route 2 (two) times daily. TRUE matrix lancets 100 each 3    levothyroxine (SYNTHROID) 150 MCG tablet Take 150 mcg 6 days a week (patient takes 75 mcg once a week) Disp #84 for 90 day supply (Patient taking differently: Take 150 mcg by mouth See admin instructions. Take 150 mcg by mouth in the morning before breakfast  on Mon/Tues/Wed/Thurs/Fri/Sat) 84 tablet 3   levothyroxine (SYNTHROID) 75 MCG tablet Take 75 mcg once a week (patient on 150 mcg 6 days a week) Dispense #24 for 90 day supply. (Patient taking differently: Take 75 mcg by mouth See admin instructions. Take 75 mcg by mouth in the morning before breakfast on Sundays only) 24 tablet 3   loratadine (CLARITIN) 10 MG tablet Take 10 mg by mouth daily as needed for allergies or rhinitis.     losartan (COZAAR) 100 MG tablet TAKE 1 TABLET EVERY DAY (Patient taking differently: Take 100 mg by mouth daily after supper.) 90 tablet 1   Magnesium 500 MG TABS Take 500 mg by mouth daily.     Multiple Vitamins-Minerals (MULTIVITAMIN WITH MINERALS) tablet Take 1 tablet by mouth daily.     nitroGLYCERIN (NITROSTAT) 0.4 MG SL tablet Place 1 tablet (0.4 mg total) under the tongue every 5 (five) minutes as needed for chest pain. 25 tablet 6   ondansetron (ZOFRAN-ODT) 4 MG disintegrating tablet Take 1 tablet (4 mg total) by mouth every 8 (eight) hours as needed for nausea or vomiting. (Patient taking differently: Take 4 mg by mouth every 8 (eight) hours as needed for nausea or vomiting (dissolve orally).) 10 tablet 0   polyethylene glycol (MIRALAX / GLYCOLAX) 17 g packet Take 17-34 g by mouth every Monday, Wednesday, and Friday.     polyvinyl alcohol (ARTIFICIAL TEARS) 1.4 % ophthalmic solution Place 1 drop into both eyes as needed for dry eyes.     PRESCRIPTION MEDICATION CPAP- At bedtime and during any time of rest     Probiotic Product (PROBIOTIC DAILY PO) Take 1 capsule by mouth daily.     rosuvastatin (CRESTOR) 5 MG tablet TAKE 1 TABLET EVERY DAY 90 tablet 1   sodium chloride (OCEAN) 0.65 % nasal spray Place 1 spray  into both nostrils as needed for congestion.     traMADol (ULTRAM) 50 MG tablet Take 1 tablet (50 mg total) by mouth every 12 (twelve) hours as needed. (Patient not taking: Reported on 06/29/2022) 10 tablet 0   vitamin B-12 (CYANOCOBALAMIN) 500 MCG tablet Take 500 mcg by mouth daily.     No current facility-administered medications for this visit.    Allergies: Allergies  Allergen Reactions   Pravastatin Other (See Comments)    Muscle cramps   Sildenafil Other (See Comments)    Tachycardia     Oxycodone Other (See Comments)    Hallucinations    Atenolol Other (See Comments)    Bradycardia    Elastic Bandages & [Zinc] Other (See Comments)    Teflon bandages - Irritation   Metoclopramide Hcl Anxiety    Social History: The patient  reports that he quit smoking about 43 years ago. His smoking use included cigarettes. He has never used smokeless tobacco. He reports that he does not currently use alcohol. He reports that he does not use drugs.   Family History: The patient's family history includes Cancer in his mother and another family member; Hypertension in an other family member; Osteoarthritis in an other family member.   Review of Systems: Please see the history of present illness.   Otherwise, the review of systems is positive for none.   All other systems are reviewed and negative.   Physical Exam: VS:  There were no vitals taken for this visit. [name]  BMI There is no height or weight on file to calculate BMI.  Wt Readings from Last 3 Encounters:  06/16/22 153 lb (  69.4 kg)  06/13/22 154 lb 9.6 oz (70.1 kg)  06/07/22 155 lb (70.3 kg)   There were no vitals taken for this visit.   Affect appropriate Frail elderly male  HEENT: normal Neck supple with no adenopathy JVP normal left bruits no thyromegaly Lungs clear with no wheezing and good diaphragmatic motion Heart:  S1/S2 no murmur, no rub, gallop or click PMI normal Abdomen: benighn, BS positve, no tenderness, no  AAA no bruit.  No HSM or HJR Distal pulses intact with no bruits No edema Neuro non-focal Skin warm and dry No muscular weakness Post bilateral TKR     LABORATORY DATA:  EKG:  12/02/19  SR rate 78  RBBB/LAFB PaC;s   Lab Results  Component Value Date   WBC 5.7 06/07/2022   HGB 12.3 (L) 06/07/2022   HCT 36.8 (L) 06/07/2022   PLT 317.0 06/07/2022   GLUCOSE 86 05/02/2022   CHOL 117 09/13/2021   TRIG 80.0 09/13/2021   HDL 50.40 09/13/2021   LDLCALC 50 09/13/2021   ALT 20 05/02/2022   AST 27 05/02/2022   NA 140 05/02/2022   K 4.0 05/02/2022   CL 104 05/02/2022   CREATININE 1.30 05/02/2022   BUN 16 05/02/2022   CO2 27 05/02/2022   TSH 0.76 03/07/2022   PSA 0.29 04/22/2018   INR 1.0 04/21/2022   HGBA1C 5.5 03/07/2022    BNP (last 3 results) Recent Labs    11/17/21 0520  BNP 170.2*    Other Studies Reviewed Today:  Echo 08/27/21  Myovue 08/02/21 Carotid 03/07/22   Assessment/Plan:  1. HTN -  continue norvasc and hydralazine ***  2. HLD - on statin therapy.   3. Carotid disease -  Duplex  03/07/22 plaque no stenosis   4. COPD with stable dyspnea.   5. CKD - baseline 1.6 ARB/Diuretic d/c   6. Neuro:  myesthenia w/u pending vision better f/u [facility] for 6th nerve palsy  7. 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 OSA: f/u [facility] wearing CPAP  10. Neuro:  f/u [facility] needs laminectomy has had cervical surgery already  [facility] to have surgery from cardiac perspective  11.  Diverticular Bleed:  f/u GI Hct 26.6 07/05/22 required 5 units of blood during hospitalization August 2023 F/U [facility]    Current medicines are reviewed with the patient today.  The patient does not have concerns regarding medicines other than what has been noted above.  The following changes have been made:  None    Disposition:    F/U in a year    Jenkins Rouge

## 2022-07-11 NOTE — Telephone Encounter (Signed)
Spoke to patient.  Questions answered. He states he is in recovery and will be returning to rehab facility.

## 2022-07-12 ENCOUNTER — Ambulatory Visit (HOSPITAL_COMMUNITY): Admission: RE | Admit: 2022-07-12 | Payer: Medicare HMO | Source: Home / Self Care | Admitting: Neurosurgery

## 2022-07-12 ENCOUNTER — Encounter (HOSPITAL_COMMUNITY): Admission: RE | Payer: Self-pay | Source: Home / Self Care

## 2022-07-12 DIAGNOSIS — W19XXXA Unspecified fall, initial encounter: Secondary | ICD-10-CM | POA: Diagnosis not present

## 2022-07-12 DIAGNOSIS — S7292XA Unspecified fracture of left femur, initial encounter for closed fracture: Secondary | ICD-10-CM | POA: Diagnosis not present

## 2022-07-12 DIAGNOSIS — R54 Age-related physical debility: Secondary | ICD-10-CM | POA: Diagnosis not present

## 2022-07-12 DIAGNOSIS — R2681 Unsteadiness on feet: Secondary | ICD-10-CM | POA: Diagnosis not present

## 2022-07-12 DIAGNOSIS — R531 Weakness: Secondary | ICD-10-CM | POA: Diagnosis not present

## 2022-07-12 DIAGNOSIS — R52 Pain, unspecified: Secondary | ICD-10-CM | POA: Diagnosis not present

## 2022-07-12 SURGERY — LUMBAR LAMINECTOMY/DECOMPRESSION MICRODISCECTOMY 2 LEVELS
Anesthesia: General | Site: Back | Laterality: Right

## 2022-07-13 DIAGNOSIS — M109 Gout, unspecified: Secondary | ICD-10-CM | POA: Diagnosis not present

## 2022-07-13 DIAGNOSIS — I10 Essential (primary) hypertension: Secondary | ICD-10-CM | POA: Diagnosis not present

## 2022-07-17 DIAGNOSIS — W19XXXA Unspecified fall, initial encounter: Secondary | ICD-10-CM | POA: Diagnosis not present

## 2022-07-17 DIAGNOSIS — S7292XA Unspecified fracture of left femur, initial encounter for closed fracture: Secondary | ICD-10-CM | POA: Diagnosis not present

## 2022-07-17 DIAGNOSIS — R2681 Unsteadiness on feet: Secondary | ICD-10-CM | POA: Diagnosis not present

## 2022-07-17 DIAGNOSIS — R54 Age-related physical debility: Secondary | ICD-10-CM | POA: Diagnosis not present

## 2022-07-17 DIAGNOSIS — R52 Pain, unspecified: Secondary | ICD-10-CM | POA: Diagnosis not present

## 2022-07-17 DIAGNOSIS — R531 Weakness: Secondary | ICD-10-CM | POA: Diagnosis not present

## 2022-07-18 DIAGNOSIS — G8929 Other chronic pain: Secondary | ICD-10-CM | POA: Diagnosis not present

## 2022-07-18 DIAGNOSIS — H04123 Dry eye syndrome of bilateral lacrimal glands: Secondary | ICD-10-CM | POA: Diagnosis not present

## 2022-07-18 DIAGNOSIS — G4733 Obstructive sleep apnea (adult) (pediatric): Secondary | ICD-10-CM | POA: Diagnosis not present

## 2022-07-18 DIAGNOSIS — K5900 Constipation, unspecified: Secondary | ICD-10-CM | POA: Diagnosis not present

## 2022-07-19 DIAGNOSIS — R2681 Unsteadiness on feet: Secondary | ICD-10-CM | POA: Diagnosis not present

## 2022-07-19 DIAGNOSIS — R52 Pain, unspecified: Secondary | ICD-10-CM | POA: Diagnosis not present

## 2022-07-19 DIAGNOSIS — R54 Age-related physical debility: Secondary | ICD-10-CM | POA: Diagnosis not present

## 2022-07-19 DIAGNOSIS — W19XXXA Unspecified fall, initial encounter: Secondary | ICD-10-CM | POA: Diagnosis not present

## 2022-07-19 DIAGNOSIS — R531 Weakness: Secondary | ICD-10-CM | POA: Diagnosis not present

## 2022-07-19 DIAGNOSIS — S7292XA Unspecified fracture of left femur, initial encounter for closed fracture: Secondary | ICD-10-CM | POA: Diagnosis not present

## 2022-07-20 ENCOUNTER — Ambulatory Visit: Payer: Medicare HMO | Admitting: Cardiovascular Disease

## 2022-07-21 DIAGNOSIS — Z4789 Encounter for other orthopedic aftercare: Secondary | ICD-10-CM | POA: Diagnosis not present

## 2022-07-24 ENCOUNTER — Telehealth: Payer: Self-pay | Admitting: Family

## 2022-07-24 DIAGNOSIS — W19XXXA Unspecified fall, initial encounter: Secondary | ICD-10-CM | POA: Diagnosis not present

## 2022-07-24 DIAGNOSIS — R54 Age-related physical debility: Secondary | ICD-10-CM | POA: Diagnosis not present

## 2022-07-24 DIAGNOSIS — R52 Pain, unspecified: Secondary | ICD-10-CM | POA: Diagnosis not present

## 2022-07-24 DIAGNOSIS — R2681 Unsteadiness on feet: Secondary | ICD-10-CM | POA: Diagnosis not present

## 2022-07-24 DIAGNOSIS — S7292XA Unspecified fracture of left femur, initial encounter for closed fracture: Secondary | ICD-10-CM | POA: Diagnosis not present

## 2022-07-24 DIAGNOSIS — R531 Weakness: Secondary | ICD-10-CM | POA: Diagnosis not present

## 2022-07-24 NOTE — Telephone Encounter (Signed)
Pt called stating that he wanted to be transferred from the facility he is currently at for his post op care and rehab to [facility]. Pt stated that he has had several issues with the facility he is at now and feels it would be best to transfer, if possible. Please advise.  [address], [place], [place] [address] P: [phone]

## 2022-07-24 NOTE — Telephone Encounter (Signed)
Pt called back to follow up on call. Rod Holler was unavailable at the time. Pt requested her call back when she has a minute.

## 2022-07-24 NOTE — Telephone Encounter (Signed)
Patient advised per pcp he will have to have his family or social worker call for the transfer. He reports a Education officer, museum at the place he is at is contacting [facility] to  see if they can do the transfer.

## 2022-07-24 NOTE — Telephone Encounter (Signed)
Patient followed up to see if [name] Pratt was back. Patient would prefer to be contacted back on his mobile number since he is in a rehab facility.

## 2022-07-24 NOTE — Telephone Encounter (Signed)
Lvm for patient to call back about this message

## 2022-07-26 DIAGNOSIS — W19XXXA Unspecified fall, initial encounter: Secondary | ICD-10-CM | POA: Diagnosis not present

## 2022-07-26 DIAGNOSIS — R2681 Unsteadiness on feet: Secondary | ICD-10-CM | POA: Diagnosis not present

## 2022-07-26 DIAGNOSIS — R531 Weakness: Secondary | ICD-10-CM | POA: Diagnosis not present

## 2022-07-26 DIAGNOSIS — S7292XA Unspecified fracture of left femur, initial encounter for closed fracture: Secondary | ICD-10-CM | POA: Diagnosis not present

## 2022-07-26 DIAGNOSIS — R54 Age-related physical debility: Secondary | ICD-10-CM | POA: Diagnosis not present

## 2022-07-26 DIAGNOSIS — R52 Pain, unspecified: Secondary | ICD-10-CM | POA: Diagnosis not present

## 2022-07-27 DIAGNOSIS — E785 Hyperlipidemia, unspecified: Secondary | ICD-10-CM | POA: Diagnosis not present

## 2022-07-27 DIAGNOSIS — M109 Gout, unspecified: Secondary | ICD-10-CM | POA: Diagnosis not present

## 2022-07-27 DIAGNOSIS — I10 Essential (primary) hypertension: Secondary | ICD-10-CM | POA: Diagnosis not present

## 2022-07-27 DIAGNOSIS — E039 Hypothyroidism, unspecified: Secondary | ICD-10-CM | POA: Diagnosis not present

## 2022-07-27 DIAGNOSIS — G8929 Other chronic pain: Secondary | ICD-10-CM | POA: Diagnosis not present

## 2022-07-27 DIAGNOSIS — D649 Anemia, unspecified: Secondary | ICD-10-CM | POA: Diagnosis not present

## 2022-07-27 DIAGNOSIS — M9702XD Periprosthetic fracture around internal prosthetic left hip joint, subsequent encounter: Secondary | ICD-10-CM | POA: Diagnosis not present

## 2022-07-27 DIAGNOSIS — K5900 Constipation, unspecified: Secondary | ICD-10-CM | POA: Diagnosis not present

## 2022-07-31 ENCOUNTER — Encounter (INDEPENDENT_AMBULATORY_CARE_PROVIDER_SITE_OTHER): Payer: Medicare HMO | Admitting: Ophthalmology

## 2022-08-02 ENCOUNTER — Telehealth: Payer: Self-pay | Admitting: Family

## 2022-08-02 NOTE — Telephone Encounter (Signed)
Caller/Agency: [facility] (jennifer) Callback Number: [phone]  Requesting OT/PT/Skilled Nursing/Social Work/Speech Therapy: PT Frequency: 1x for 9 weeks

## 2022-08-07 ENCOUNTER — Ambulatory Visit (INDEPENDENT_AMBULATORY_CARE_PROVIDER_SITE_OTHER): Payer: Medicare HMO | Admitting: Family

## 2022-08-07 VITALS — BP 117/48 | HR 78 | Temp 97.9°F | Resp 16 | Wt 154.0 lb

## 2022-08-07 DIAGNOSIS — S72002A Fracture of unspecified part of neck of left femur, initial encounter for closed fracture: Secondary | ICD-10-CM | POA: Insufficient documentation

## 2022-08-07 DIAGNOSIS — E039 Hypothyroidism, unspecified: Secondary | ICD-10-CM

## 2022-08-07 DIAGNOSIS — G4733 Obstructive sleep apnea (adult) (pediatric): Secondary | ICD-10-CM

## 2022-08-07 DIAGNOSIS — E559 Vitamin D deficiency, unspecified: Secondary | ICD-10-CM

## 2022-08-07 DIAGNOSIS — M5136 Other intervertebral disc degeneration, lumbar region: Secondary | ICD-10-CM | POA: Diagnosis not present

## 2022-08-07 DIAGNOSIS — I10 Essential (primary) hypertension: Secondary | ICD-10-CM

## 2022-08-07 DIAGNOSIS — D649 Anemia, unspecified: Secondary | ICD-10-CM

## 2022-08-07 HISTORY — DX: Vitamin D deficiency, unspecified: E55.9

## 2022-08-07 HISTORY — DX: Fracture of unspecified part of neck of left femur, initial encounter for closed fracture: S72.002A

## 2022-08-07 LAB — CBC WITH DIFFERENTIAL/PLATELET
Basophils Absolute: 0 10*3/uL (ref 0.0–0.1)
Basophils Relative: 0.7 % (ref 0.0–3.0)
Eosinophils Absolute: 0.3 10*3/uL (ref 0.0–0.7)
Eosinophils Relative: 4.9 % (ref 0.0–5.0)
HCT: 31.5 % — ABNORMAL LOW (ref 39.0–52.0)
Hemoglobin: 10.6 g/dL — ABNORMAL LOW (ref 13.0–17.0)
Lymphocytes Relative: 21.5 % (ref 12.0–46.0)
Lymphs Abs: 1.4 10*3/uL (ref 0.7–4.0)
MCHC: 33.5 g/dL (ref 30.0–36.0)
MCV: 95.7 fl (ref 78.0–100.0)
Monocytes Absolute: 0.7 10*3/uL (ref 0.1–1.0)
Monocytes Relative: 10.8 % (ref 3.0–12.0)
Neutro Abs: 4 10*3/uL (ref 1.4–7.7)
Neutrophils Relative %: 62.1 % (ref 43.0–77.0)
Platelets: 480 10*3/uL — ABNORMAL HIGH (ref 150.0–400.0)
RBC: 3.29 Mil/uL — ABNORMAL LOW (ref 4.22–5.81)
RDW: 15.1 % (ref 11.5–15.5)
WBC: 6.4 10*3/uL (ref 4.0–10.5)

## 2022-08-07 LAB — COMPREHENSIVE METABOLIC PANEL
ALT: 10 U/L (ref 0–53)
AST: 16 U/L (ref 0–37)
Albumin: 3.8 g/dL (ref 3.5–5.2)
Alkaline Phosphatase: 170 U/L — ABNORMAL HIGH (ref 39–117)
BUN: 22 mg/dL (ref 6–23)
CO2: 28 mEq/L (ref 19–32)
Calcium: 8.8 mg/dL (ref 8.4–10.5)
Chloride: 102 mEq/L (ref 96–112)
Creatinine, Ser: 1.16 mg/dL (ref 0.40–1.50)
GFR: 58.67 mL/min — ABNORMAL LOW (ref >60.00)
Glucose, Bld: 121 mg/dL — ABNORMAL HIGH (ref 70–99)
Potassium: 3.7 mEq/L (ref 3.5–5.1)
Sodium: 138 mEq/L (ref 135–145)
Total Bilirubin: 0.3 mg/dL (ref 0.2–1.2)
Total Protein: 7.3 g/dL (ref 6.0–8.3)

## 2022-08-07 LAB — TSH: TSH: 0.83 u[IU]/mL (ref 0.35–5.50)

## 2022-08-07 LAB — FERRITIN: Ferritin: 120.9 ng/mL (ref 22.0–322.0)

## 2022-08-07 LAB — IRON: Iron: 35 ug/dL — ABNORMAL LOW (ref 42–165)

## 2022-08-07 MED ORDER — LEVOTHYROXINE SODIUM 150 MCG PO TABS: ORAL_TABLET | ORAL | 1 refills | Status: DC

## 2022-08-07 MED ORDER — LEVOTHYROXINE SODIUM 75 MCG PO TABS: ORAL_TABLET | ORAL | 1 refills | Status: DC

## 2022-08-07 MED ORDER — HYDRALAZINE HCL 50 MG PO TABS: 50.0000 mg | ORAL_TABLET | Freq: Two times a day (BID) | ORAL | 1 refills | Status: DC

## 2022-08-07 MED ORDER — ERGOCALCIFEROL 1.25 MG (50000 UT) PO CAPS: 50000.0000 [IU] | ORAL_CAPSULE | ORAL | 0 refills | Status: AC

## 2022-08-07 MED ORDER — ALLOPURINOL 100 MG PO TABS: 100.0000 mg | ORAL_TABLET | Freq: Two times a day (BID) | ORAL | 1 refills | Status: DC

## 2022-08-07 MED ORDER — AMLODIPINE BESYLATE 2.5 MG PO TABS: 2.5000 mg | ORAL_TABLET | Freq: Every day | ORAL | 1 refills | Status: DC

## 2022-08-07 MED ORDER — ROSUVASTATIN CALCIUM 5 MG PO TABS: 5.0000 mg | ORAL_TABLET | Freq: Every day | ORAL | 1 refills | Status: DC

## 2022-08-07 NOTE — Progress Notes (Signed)
Subjective:   By signing my name below, I, [name] Pratt, attest that this documentation has been prepared under the direction and in the presence of [name] Pratt, 08/07/2022.   Patient ID: [name] Pratt, male    DOB: 08/15/1941, 81 y.o.   MRN: [id number]  Chief Complaint  Patient presents with   Follow-up    Patient here for follow up after rehab    HPI Patient is in today for an office visit.  Follow up for hospitalization  Patient was admitted to the ER on 10/21 due to a fall and subsequent L hip fracture.  He underwent total hip revision femoral component with open treatment of fracture with cable fixation the following day. He was sent to [facility] for about 3 weeks.  He was unhappy with his experience at that center.  He states this visit that he has to walk on his left tip-toes and puts most of his weight on his right side. He continues to use a rolling walker.  He reports that the surgeon is going to reevaluate him on Dec 8.   Hypertension Patient is complaint with his 2.5 mg Norvasc and 50 mg Apresoline. His blood pressure is high this visit. BP Readings from Last 3 Encounters:  08/07/22 (!) 117/48  06/16/22 126/72  06/13/22 (!) 133/52   Pulse Readings from Last 3 Encounters:  08/07/22 78  06/16/22 62  06/13/22 69   Sleep apnea Patient reports that he regularly wears his cpap machine.  Vitamin D Patient reports that he has been taking his weekly vitamin D supplements for about a month now.   Health Maintenance Due  Topic Date Due   FOOT EXAM  09/29/2021   COVID-19 Vaccine (5 - 2023-24 season) 05/12/2022    Past Medical History:  Diagnosis Date   Allergic rhinitis 02/02/2016   Anemia 12/17/2009   Formatting of this note might be different from the original. Anemia  10/1 IMO update   Aortic atherosclerosis ([place]) 03/05/2020   Arthritis    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   Atypical chest pain 11/24/2015   Benign  essential hypertension 05/13/2009   Qualifier: Diagnosis of  By: Larwance Sachs of this note might be different from the original. Hypertension   Benign paroxysmal positional vertigo 09/14/2021   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[place]) 07/22/2021   Body mass index (BMI) 25.0-25.9, adult 11/02/2020   Cancer ([place])    skin cancer   Cardiac murmur 07/25/2021   Carotid stenosis    a. Carotid U/S 6/14: LICA < 43%, RICA 15-40%;  b.  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   Cystoid macular edema of both eyes 07/28/2020   Degenerative disc disease, lumbar 06/30/2015   Deviated nasal septum 02/18/2015   Diverticulosis 03/03/2020   DJD (degenerative joint disease) of hip 12/24/2012   Dyspnea 06/12/2011   Emphysema, unspecified ([place]) 03/05/2020   Erectile dysfunction 06/20/2017   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[place]) 02/09/2021   Gait disorder 03/02/2021   GERD (gastroesophageal reflux disease)    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   Qualifier: Diagnosis of  By: Burnett Kanaris     Greater trochanteric pain syndrome 07/22/2021   Hand pain, left 01/16/2022   Hearing loss 02/02/2016   Heme positive stool 02/29/2020   HIATAL HERNIA 05/13/2009   Qualifier: Diagnosis of  By: Burnett Kanaris     High risk medication use 01/18/2012   Hip pain 03/02/2021   History of chicken pox    History of COVID-19 12/05/2021   History of hemorrhoids    History of kidney stones    History of skin cancer 07/22/2021   skin cancer   History of total bilateral knee replacement 07/08/2020   Hyperglycemia  03/03/2014   Hyperlipidemia with target LDL less than 130 12/27/2010   Formatting of this note might be different from the original. Cardiology - Dr Frances Nickels at [facility]  ICD-10 cut over   Hypertension    under control   Hypokalemia 07/08/2021   Hypothyroidism    Internal hemorrhoids 03/03/2020   Leg cramps 11/13/2019   Lower GI bleed 12/12/2012   Lumbago of lumbar region with sciatica 10/21/2020   Lumbar radiculopathy 03/02/2021   Lumbar spondylosis 11/02/2020   NEPHROLITHIASIS 05/13/2009   Qualifier: Diagnosis of  By: Burnett Kanaris     Nonspecific abnormal electrocardiogram (ECG) (EKG) 01/06/2013   Numbness of hand 01/24/2022   Onychomycosis 06/30/2015   Osteoarthrosis, 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 Osteoarthritis Of The Hand  10/1 IMO update   Osteoarthrosis, unspecified whether generalized or localized, pelvic region and thigh 07/25/2010   Formatting of this note might be different from the original. Osteoarthritis Of The Hip   Other abnormal glucose 07/22/2021   Other allergic rhinitis 02/18/2015   Palpitations 07/25/2021   Peripheral neuropathy 06/19/2016   Preventative health care 11/24/2015   Right groin pain 11/29/2012   Right inguinal hernia 05/13/2009   Qualifier: Diagnosis of  By: Burnett Kanaris     RLS (restless legs syndrome) 02/18/2015   Rosacea    Rotator cuff tear 07/27/2013   Situational depression 03/02/2021   Skin lesion 01/16/2022   Sleep apnea with use of continuous positive airway pressure (CPAP) 07/15/2013   CPAP set to  7 cm water,  Residual AHi was 7.8  With no leak.  User time 6 hours.  479 days , not used only 12 days - highly compliant 06-23-13  .    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   Urinary retention 11/26/2020   Ventral hernia 07/08/2012   Vertigo 07/08/2021   Weight loss 03/03/2020    Past Surgical History:  Procedure Laterality Date   ABDOMINAL HERNIA REPAIR  07/15/12   Dr [name]   ANTERIOR CERVICAL DECOMP/DISCECTOMY FUSION N/A 07/09/2020   Procedure: ANTERIOR CERVICAL DECOMPRESSION AND FUSION CERVICAL THREE-FOUR.;  Surgeon: [name], MD;  Location: [place];  Service: Neurosurgery;  Laterality: N/A;  anterior   BROW LIFT  05/07/01   COLONOSCOPY WITH PROPOFOL N/A 04/24/2022   Procedure: COLONOSCOPY WITH PROPOFOL;  Surgeon: Thornton Park, MD;  Location: [place];  Service: Gastroenterology;  Laterality: N/A;   COLONOSCOPY WITH PROPOFOL N/A 04/26/2022   Procedure: COLONOSCOPY WITH PROPOFOL;  Surgeon: Thornton Park, MD;  Location:  [facility];  Service: Gastroenterology;  Laterality: N/A;   CYSTOSCOPY WITH URETEROSCOPY AND STENT PLACEMENT Right 01/28/2014   Procedure: CYSTOSCOPY WITH URETEROSCOPY, BASKET RETRIVAL AND  STENT PLACEMENT;  Surgeon: [name], MD;  Location: [facility];  Service: Urology;  Laterality: Right;   epidural injections     multiple procedures   ESOPHAGEAL DILATION  1993 and 1994   multiple times   EYE SURGERY Bilateral 03/25/10, 2012   cataract removal   HEMORRHOID SURGERY  2014   HIATAL HERNIA REPAIR  12/21/93   HOLMIUM LASER APPLICATION Right 4/88/8916   Procedure: HOLMIUM LASER APPLICATION;  Surgeon: [name], MD;  Location: [facility];  Service: Urology;  Laterality: Right;   INGUINAL HERNIA REPAIR Right 07/15/12   Dr [name], x2   JOINT REPLACEMENT  07/25/04   right knee   JOINT REPLACEMENT  07/13/10   left knee   [place]  09/20/06   with intraoperative cholangiogram and right inguinal herniorrhaphy with mesh    LIGAMENT REPAIR Left 07/2013    shoulder   LITHOTRIPSY     x2   [place]   POLYPECTOMY  04/26/2022   Procedure: POLYPECTOMY;  Surgeon: Thornton Park, MD;  Location: [place];  Service: Gastroenterology;;   POSTERIOR CERVICAL FUSION/FORAMINOTOMY N/A 11/24/2020   Procedure: Posterior Cervical Laminectomy Cervical three-four, Cervical four-five with lateral mass fusion;  Surgeon: [name], MD;  Location: [place];  Service: Neurosurgery;  Laterality: N/A;   REFRACTIVE SURGERY Bilateral    Right total hip replacement  4/14   SKIN CANCER DESTRUCTION     nose, ear   TONSILLECTOMY  as child   TOTAL HIP ARTHROPLASTY Left    URETHRAL DILATION  1991   [name]    Family History  Problem Relation Age of Onset   Cancer Mother    Hypertension Other    Cancer Other    Osteoarthritis Other    Stomach cancer Neg Hx    Colon cancer Neg Hx    Esophageal cancer Neg Hx     Social History   Socioeconomic History   Marital status: Married    Spouse name: Not on file   Number of children: 1   Years of education: Not on file   Highest education level: Not on file  Occupational History   Occupation: firefighter    Comment: paints on the side   Occupation: retired  Tobacco Use   Smoking status: Former    Years: 11.00    Types: Cigarettes    Quit date: 09/11/1978    Years since quitting: 43.9   Smokeless tobacco: Never  Vaping Use   Vaping Use: Never used  Substance and Sexual Activity   Alcohol use: Not Currently    Alcohol/week: 0.0 standard drinks of alcohol    Comment: rare   Drug use: No   Sexual activity: Not Currently  Other Topics Concern   Not on file  Social History Narrative   Lives with his wife   He has one son- lives locally.   2 grandchildren   Has worked for Research officer, trade union   Enjoys wood working   Completed HS.  [employer]   Social Determinants of Health   Financial Resource Strain: Low Risk  (10/28/2021)   Overall Financial Resource Strain (CARDIA)    Difficulty of  Paying Living Expenses: Not very hard  Food Insecurity: No Food Insecurity (06/05/2022)   Hunger Vital Sign  Worried About Charity fundraiser in the Last Year: Never true    [place] in the Last Year: Never true  Transportation Needs: Unknown (06/05/2022)   PRAPARE - Hydrologist (Medical): No    Lack of Transportation (Non-Medical): Not on file  Physical Activity: Insufficiently Active (10/28/2021)   Exercise Vital Sign    Days of Exercise per Week: 2 days    Minutes of Exercise per Session: 30 min  Stress: No Stress Concern Present (01/19/2022)   [place]    Feeling of Stress : Only a little  Social Connections: Moderately Isolated (01/19/2022)   Social Connection and Isolation Panel [NHANES]    Frequency of Communication with Friends and Family: More than three times a week    Frequency of Social Gatherings with Friends and Family: More than three times a week    Attends Religious Services: Never    Marine scientist or Organizations: No    Attends Archivist Meetings: Never    Marital Status: Married  Human resources officer Violence: Not At Risk (01/19/2022)   Humiliation, Afraid, Rape, and Kick questionnaire    Fear of Current or Ex-Partner: No    Emotionally Abused: No    Physically Abused: No    Sexually Abused: No    Outpatient Medications Prior to Visit  Medication Sig Dispense Refill   acetaminophen (TYLENOL) 650 MG CR tablet Take 650-1,300 mg by mouth every 8 (eight) hours as needed for pain.     albuterol (VENTOLIN HFA) 108 (90 Base) MCG/ACT inhaler INHALE 2 PUFFS INTO THE LUNGS EVERY 6 HOURS AS NEEDED FOR SHORTNESS OF BREATH. (Patient taking differently: Inhale 2 puffs into the lungs every 6 (six) hours as needed for shortness of breath.) 1 each 3   antiseptic oral rinse (BIOTENE) LIQD 15 mLs by Mouth Rinse route in the morning.     aspirin EC 81 MG tablet  Take 81 mg by mouth at bedtime.     cycloSPORINE (RESTASIS) 0.05 % ophthalmic emulsion Place 1 drop into both eyes 2 (two) times daily.     diclofenac Sodium (VOLTAREN) 1 % GEL Apply 2 g topically daily as needed (for arthritic pain in the hands).     docusate sodium (COLACE) 100 MG capsule Take 1 capsule (100 mg total) by mouth 2 (two) times daily. 60 capsule 11   GEMTESA 75 MG TABS Take 75 mg by mouth daily.     glucose blood test strip TRUE Metrix Blood Glucose Test Strip, use as instructed 100 each 12   Homeopathic Products (THERAWORX RELIEF) FOAM Apply 1 application topically 2 (two) times daily as needed (Pain).     Lancets MISC 1 each by Does not apply route 2 (two) times daily. TRUE matrix lancets 100 each 3   loratadine (CLARITIN) 10 MG tablet Take 10 mg by mouth daily as needed for allergies or rhinitis.     losartan (COZAAR) 100 MG tablet TAKE 1 TABLET EVERY DAY (Patient taking differently: Take 100 mg by mouth daily after supper.) 90 tablet 1   Magnesium 500 MG TABS Take 500 mg by mouth daily.     Multiple Vitamins-Minerals (MULTIVITAMIN WITH MINERALS) tablet Take 1 tablet by mouth daily.     nitroGLYCERIN (NITROSTAT) 0.4 MG SL tablet Place 1 tablet (0.4 mg total) under the tongue every 5 (five) minutes as needed for chest pain. 25 tablet 6   polyethylene  glycol (MIRALAX / GLYCOLAX) 17 g packet Take 17-34 g by mouth every Monday, Wednesday, and Friday.     polyvinyl alcohol (ARTIFICIAL TEARS) 1.4 % ophthalmic solution Place 1 drop into both eyes as needed for dry eyes.     PRESCRIPTION MEDICATION CPAP- At bedtime and during any time of rest     Probiotic Product (PROBIOTIC DAILY PO) Take 1 capsule by mouth daily.     sodium chloride (OCEAN) 0.65 % nasal spray Place 1 spray into both nostrils as needed for congestion.     traMADol (ULTRAM) 50 MG tablet Take 1 tablet (50 mg total) by mouth every 12 (twelve) hours as needed. 10 tablet 0   vitamin B-12 (CYANOCOBALAMIN) 500 MCG tablet Take  500 mcg by mouth daily.     allopurinol (ZYLOPRIM) 100 MG tablet TAKE 1 TABLET TWICE DAILY (Patient taking differently: Take 100 mg by mouth in the morning and at bedtime.) 180 tablet 0   amLODipine (NORVASC) 2.5 MG tablet Take 1 tablet (2.5 mg total) by mouth daily. 90 tablet 0   ergocalciferol (VITAMIN D2) 1.25 MG (50000 UT) capsule Take by mouth once a week.     fluocinonide (LIDEX) 0.05 % external solution Apply 1 application  topically 2 (two) times daily as needed for itching (SCALP).     hydrALAZINE (APRESOLINE) 50 MG tablet Take 1 tablet (50 mg total) by mouth 2 (two) times daily. 60 tablet 2   levothyroxine (SYNTHROID) 150 MCG tablet Take 150 mcg 6 days a week (patient takes 75 mcg once a week) Disp #84 for 90 day supply (Patient taking differently: Take 150 mcg by mouth See admin instructions. Take 150 mcg by mouth in the morning before breakfast on Mon/Tues/Wed/Thurs/Fri/Sat) 84 tablet 3   levothyroxine (SYNTHROID) 75 MCG tablet Take 75 mcg once a week (patient on 150 mcg 6 days a week) Dispense #24 for 90 day supply. (Patient taking differently: Take 75 mcg by mouth See admin instructions. Take 75 mcg by mouth in the morning before breakfast on Sundays only) 24 tablet 3   rosuvastatin (CRESTOR) 5 MG tablet TAKE 1 TABLET EVERY DAY 90 tablet 1   ondansetron (ZOFRAN-ODT) 4 MG disintegrating tablet Take 1 tablet (4 mg total) by mouth every 8 (eight) hours as needed for nausea or vomiting. (Patient not taking: Reported on 08/07/2022) 10 tablet 0   No facility-administered medications prior to visit.    Allergies  Allergen Reactions   Pravastatin Other (See Comments)    Muscle cramps   Sildenafil Other (See Comments)    Tachycardia     Oxycodone Other (See Comments)    Hallucinations    Atenolol Other (See Comments)    Bradycardia    Elastic Bandages & [Zinc] Other (See Comments)    Teflon bandages - Irritation   Metoclopramide Hcl Anxiety    ROS     Objective:     Physical Exam Constitutional:      General: He is not in acute distress.    Appearance: Normal appearance. He is not ill-appearing.  HENT:     Head: Normocephalic and atraumatic.     Right Ear: External ear normal.     Left Ear: External ear normal.  Eyes:     Extraocular Movements: Extraocular movements intact.     Pupils: Pupils are equal, round, and reactive to light.  Cardiovascular:     Rate and Rhythm: Normal rate and regular rhythm.     Heart sounds: Normal heart sounds. No murmur heard.  No gallop.  Pulmonary:     Effort: Pulmonary effort is normal. No respiratory distress.     Breath sounds: Normal breath sounds. No wheezing or rales.  Skin:    General: Skin is warm and dry.  Neurological:     Mental Status: He is alert and oriented to person, place, and time.  Psychiatric:        Mood and Affect: Mood normal.        Behavior: Behavior normal.        Judgment: Judgment normal.     BP (!) 117/48 (BP Location: Right Arm, Patient Position: Sitting, Cuff Size: Small)   Pulse 78   Temp 97.9 F (36.6 C) (Oral)   Resp 16   Wt 154 lb (69.9 kg)   SpO2 100%   BMI 24.12 kg/m  Wt Readings from Last 3 Encounters:  08/07/22 154 lb (69.9 kg)  06/16/22 153 lb (69.4 kg)  06/13/22 154 lb 9.6 oz (70.1 kg)       Assessment & Plan:   Problem List Items Addressed This Visit       Unprioritized   Vitamin D deficiency    New. Was started on vit D 50000 iu weekly in the hospital.  Plan to continue for 12 weeks and then repeat vit D.       OSA (obstructive sleep apnea)    Continues cpap.       Hypothyroidism    Clinically stable on synthroid. Check follow up tsh.       Relevant Medications   levothyroxine (SYNTHROID) 150 MCG tablet   levothyroxine (SYNTHROID) 75 MCG tablet   Other Relevant Orders   TSH (Completed)   Hypertension - Primary    BP Readings from Last 3 Encounters:  08/07/22 (!) 117/48  06/16/22 126/72  06/13/22 (!) 133/52  Maintained on  hydralazine and amlodipine. At goal.       Relevant Medications   amLODipine (NORVASC) 2.5 MG tablet   hydrALAZINE (APRESOLINE) 50 MG tablet   rosuvastatin (CRESTOR) 5 MG tablet   Other Relevant Orders   Comp Met (CMET) (Completed)   Degenerative disc disease, lumbar    He was scheduled for right laminectomy and foraminotomy - L2-L3 - L3-L4 with [name] but this was cancelled due to his hip fracture.      Relevant Medications   allopurinol (ZYLOPRIM) 100 MG tablet   Closed fracture of left hip ([place])    He will start home health PT this week.  Has follow up with scheduled with orthopedics.       Anemia   Relevant Orders   CBC with Differential/Platelet (Completed)   Iron (Completed)   Ferritin (Completed)   Meds ordered this encounter  Medications   allopurinol (ZYLOPRIM) 100 MG tablet    Sig: Take 1 tablet (100 mg total) by mouth 2 (two) times daily.    Dispense:  180 tablet    Refill:  1   amLODipine (NORVASC) 2.5 MG tablet    Sig: Take 1 tablet (2.5 mg total) by mouth daily.    Dispense:  90 tablet    Refill:  1   hydrALAZINE (APRESOLINE) 50 MG tablet    Sig: Take 1 tablet (50 mg total) by mouth 2 (two) times daily.    Dispense:  180 tablet    Refill:  1   levothyroxine (SYNTHROID) 150 MCG tablet    Sig: Take 150 mcg 6 days a week (patient takes 75 mcg once a week) Disp #  84 for 90 day supply    Dispense:  84 tablet    Refill:  1   DISCONTD: levothyroxine (SYNTHROID) 75 MCG tablet    Sig: Take 75 mcg once a week (patient on 150 mcg 6 days a week) Dispense #24 for 90 day supply.    Dispense:  12 tablet    Refill:  1   rosuvastatin (CRESTOR) 5 MG tablet    Sig: Take 1 tablet (5 mg total) by mouth daily.    Dispense:  90 tablet    Refill:  1   ergocalciferol (VITAMIN D2) 1.25 MG (50000 UT) capsule    Sig: Take 1 capsule (50,000 Units total) by mouth once a week.    Dispense:  8 capsule    Refill:  0    Order Specific Question:   Supervising Provider    Answer:    Penni Homans A [[id number]]   levothyroxine (SYNTHROID) 75 MCG tablet    Sig: Take 75 mcg once a week (patient on 150 mcg 6 days a week) Dispense #24 for 90 day supply.    Dispense:  12 tablet    Refill:  1    I, [name] Pratt, personally preformed the services described in this documentation.  All medical record entries made by the scribe were at my direction and in my presence.  I have reviewed the chart and discharge instructions (if applicable) and agree that the record reflects my personal performance and is accurate and complete. 08/07/2022.   I,Verona Buck,acting as a Education administrator for [employer], NP.,have documented all relevant documentation on the behalf of Nance Pear, NP,as directed by  Nance Pear, NP while in the presence of Nance Pear, NP.    Nance Pear, NP

## 2022-08-07 NOTE — Assessment & Plan Note (Signed)
Continues cpap.

## 2022-08-07 NOTE — Assessment & Plan Note (Signed)
He was scheduled for right laminectomy and foraminotomy - L2-L3 - L3-L4 with [name] but this was cancelled due to his hip fracture.

## 2022-08-07 NOTE — Assessment & Plan Note (Signed)
>>  ASSESSMENT AND PLAN FOR OSA (OBSTRUCTIVE SLEEP APNEA) WRITTEN ON 08/07/2022  9:50 AM BY O'SULLIVAN, Akeel Reffner, NP  Continues cpap.

## 2022-08-07 NOTE — Assessment & Plan Note (Signed)
He will start home health PT this week.  Has follow up with scheduled with orthopedics.

## 2022-08-07 NOTE — Assessment & Plan Note (Signed)
New. Was started on vit D 50000 iu weekly in the hospital.  Plan to continue for 12 weeks and then repeat vit D.

## 2022-08-07 NOTE — Assessment & Plan Note (Signed)
BP Readings from Last 3 Encounters:  08/07/22 (!) 117/48  06/16/22 126/72  06/13/22 (!) 133/52   Maintained on hydralazine and amlodipine. At goal.

## 2022-08-07 NOTE — Assessment & Plan Note (Signed)
Clinically stable on synthroid. Check follow up tsh.

## 2022-08-07 NOTE — Telephone Encounter (Signed)
Patient Name [name] Pratt Patient DOB 15-Jan-1941 Requesting Provider [facility] Physician Number [place] Name [place]. Time Disposition Final User 08/04/2022 8:47:05 AM General Information Provided Yes Bernell List Call Closed By: Bernell List Transaction Date/Time: 08/04/2022 8:43:38 AM (ET)

## 2022-08-08 NOTE — Telephone Encounter (Signed)
Orders given.  

## 2022-08-09 ENCOUNTER — Other Ambulatory Visit: Payer: Self-pay | Admitting: Family

## 2022-08-09 ENCOUNTER — Telehealth: Payer: Self-pay | Admitting: Family

## 2022-08-09 DIAGNOSIS — D509 Iron deficiency anemia, unspecified: Secondary | ICD-10-CM

## 2022-08-09 MED ORDER — IRON (FERROUS SULFATE) 325 (65 FE) MG PO TABS: 325.0000 mg | ORAL_TABLET | ORAL | Status: DC

## 2022-08-09 NOTE — Telephone Encounter (Signed)
Lab work shows iron deficiency anemia. Please add iron '325mg'$  1 tab by mouth every other day.

## 2022-08-09 NOTE — Telephone Encounter (Signed)
Patient notified of results and advised to start iron 325 q other day

## 2022-08-09 NOTE — Telephone Encounter (Signed)
Patient called to get confirmation on iron supplement he is supposed to be starting. Relayed message of 325 mg e/o day. He said he was put on a high dose of vitamin d for his iron and he takes it weekly on Saturdays and wants to know if he needs to continue that as well. Please call to advise.

## 2022-08-09 NOTE — Telephone Encounter (Signed)
Pt called back to advised that the '325mg'$  of iron he could not get OTC. He stated the pharmacy told him that it would have to a Rx. Please Advise.

## 2022-08-10 ENCOUNTER — Telehealth: Payer: Self-pay | Admitting: Family

## 2022-08-10 MED ORDER — IRON (FERROUS SULFATE) 325 (65 FE) MG PO TABS: 325.0000 mg | ORAL_TABLET | ORAL | 0 refills | Status: DC

## 2022-08-10 NOTE — Telephone Encounter (Signed)
Roetta Sessions with [facility] called to give a fall report for patient. Around 8:30 am this morning he fell and hit the back of his head on the door facing (wood part) and it left a quarter size bruise. Patient put too much weight on one side of walker which led to the fall and he was able to get himself up.

## 2022-08-10 NOTE — Addendum Note (Signed)
Addended by: Kem Boroughs D on: 08/10/2022 12:30 PM   Modules accepted: Orders

## 2022-08-10 NOTE — Telephone Encounter (Signed)
Patient notified that rx was sent but he should have been able to get OTC.

## 2022-08-11 ENCOUNTER — Telehealth: Payer: Self-pay | Admitting: Family

## 2022-08-11 NOTE — Telephone Encounter (Signed)
Noted. See my chart.

## 2022-08-11 NOTE — Telephone Encounter (Signed)
Caller/Agency: [facility] Number: [phone] Requesting OT/PT/Skilled Nursing/Social Work/Speech Therapy: OT Frequency: 1 w 5

## 2022-08-11 NOTE — Telephone Encounter (Signed)
Verbal orders given to [facility] at [facility]

## 2022-08-14 DIAGNOSIS — Z129 Encounter for screening for malignant neoplasm, site unspecified: Secondary | ICD-10-CM | POA: Diagnosis not present

## 2022-08-14 DIAGNOSIS — L249 Irritant contact dermatitis, unspecified cause: Secondary | ICD-10-CM | POA: Diagnosis not present

## 2022-08-14 DIAGNOSIS — E663 Overweight: Secondary | ICD-10-CM | POA: Diagnosis not present

## 2022-08-14 DIAGNOSIS — Z85828 Personal history of other malignant neoplasm of skin: Secondary | ICD-10-CM | POA: Diagnosis not present

## 2022-08-17 ENCOUNTER — Telehealth: Payer: Self-pay | Admitting: Family

## 2022-08-17 NOTE — Telephone Encounter (Signed)
Physical therapist from [facility] called stating she is currently with the patient and she got a bp reading of 88/40. She stated she took it twice manually and they were equally low. Patient was triaged for further assistance.

## 2022-08-17 NOTE — Telephone Encounter (Signed)
Maudie Mercury- can you check triage folder? I'm unable to see it over here at [employer]. Thank you.

## 2022-08-18 DIAGNOSIS — Z4789 Encounter for other orthopedic aftercare: Secondary | ICD-10-CM | POA: Diagnosis not present

## 2022-08-18 NOTE — Telephone Encounter (Signed)
Attempted to contact patient, no answer.

## 2022-08-21 DIAGNOSIS — G4733 Obstructive sleep apnea (adult) (pediatric): Secondary | ICD-10-CM | POA: Diagnosis not present

## 2022-08-21 DIAGNOSIS — H35353 Cystoid macular degeneration, bilateral: Secondary | ICD-10-CM | POA: Diagnosis not present

## 2022-08-21 DIAGNOSIS — H353211 Exudative age-related macular degeneration, right eye, with active choroidal neovascularization: Secondary | ICD-10-CM | POA: Diagnosis not present

## 2022-08-21 DIAGNOSIS — H35073 Retinal telangiectasis, bilateral: Secondary | ICD-10-CM | POA: Diagnosis not present

## 2022-08-21 NOTE — Telephone Encounter (Signed)
Spoke with wife, Pamala Hurry.  Pamala Hurry states the patient has been out since before lunch running errands.  She is not sure when he last checked his BP.  Advised wife to have patient call with low BP readings to make an appointment, verbalized understanding.

## 2022-08-21 NOTE — Telephone Encounter (Signed)
Spoke with patient.  Patient reports BP this morning was 161/78 with a repeat of 153/75.  Patient states he is standing in the bathroom leaning on the counter when taking reading. Reviewed all medications, taking as prescribed.  Advised patient to continue medications, check BP daily around the same time after sitting for 10 minutes.  Patient is to call with increased numbers and on Friday to report readings. Advised to ask for this nurse.   Patient also would like to inform PCP he is now full weight bearing.

## 2022-08-22 NOTE — Telephone Encounter (Signed)
Noted  

## 2022-08-25 NOTE — Telephone Encounter (Signed)
Patient called with recent BP readings, ranging 153/78 to 165/85. One reading of 123/63.  He reports they were not taken at the same time each day. Also reports holding one medication if his reading is too low, but could not tell me the name of the medication.   Patient is scheduled to see PCP on Monday, 08/28/22, advised to bring medications with him along with BP readings from the next few days. Verbalized understanding.

## 2022-08-25 NOTE — Telephone Encounter (Signed)
Noted  

## 2022-08-28 ENCOUNTER — Ambulatory Visit (INDEPENDENT_AMBULATORY_CARE_PROVIDER_SITE_OTHER): Payer: Medicare HMO | Admitting: Family

## 2022-08-28 ENCOUNTER — Encounter: Payer: Self-pay | Admitting: Family

## 2022-08-28 VITALS — BP 175/65 | HR 69 | Temp 97.9°F | Resp 16 | Wt 149.0 lb

## 2022-08-28 DIAGNOSIS — I10 Essential (primary) hypertension: Secondary | ICD-10-CM

## 2022-08-28 DIAGNOSIS — Z96649 Presence of unspecified artificial hip joint: Secondary | ICD-10-CM

## 2022-08-28 HISTORY — DX: Presence of unspecified artificial hip joint: Z96.649

## 2022-08-28 MED ORDER — AMLODIPINE BESYLATE 5 MG PO TABS: 5.0000 mg | ORAL_TABLET | Freq: Every day | ORAL | 1 refills | Status: DC

## 2022-08-28 NOTE — Assessment & Plan Note (Addendum)
BP Readings from Last 3 Encounters:  08/28/22 (!) 175/65  08/07/22 (!) 117/48  06/16/22 126/72   Uncontrolled. Home readings look similar to today's reading. Will increase amlodipine from 2.5 mg to 5 mg.

## 2022-08-28 NOTE — Progress Notes (Signed)
Subjective:   By signing my name below, I, [name] Pratt, attest that this documentation has been prepared under the direction and in the presence of [name], 08/28/2022.   Patient ID: [name] Pratt, male    DOB: 30-Jun-1941, 81 y.o.   MRN: [id number]  Chief Complaint  Patient presents with   Hypertension    Patient reports elevated readings at home   Follow-up    Needs evaluation for outpatient therapy    HPI Patient is in today for an office visit.  Hypertension Patient regularly checks blood pressure at home. He states that his home readings have recently been elevated. He is complaint with 100 mg Cozaar and 2.5 mg Norvasc medications. Patient reports that when he increased his norvasc, his blood pressure dropped to much.   Patients blood pressure readings at home: 12/15 at 7 pm 172/81 pulse 58, 12/16 at 7:05 pm 168/83 pulse 59, and 12/17 at 7 pm 172/75 pulse 55.  Follow up Patient is requesting a referral to the outpatient therapy in the office. He is recovering from a left hip fracture requiring operative revision of Total Hip Arthroplasty due to a fall.   Health Maintenance Due  Topic Date Due   DTaP/Tdap/Td (2 - Td or Tdap) 12/26/2020   FOOT EXAM  09/29/2021   COVID-19 Vaccine (5 - 2023-24 season) 05/12/2022    Past Medical History:  Diagnosis Date   Allergic rhinitis 02/02/2016   Anemia 12/17/2009   Formatting of this note might be different from the original. Anemia  10/1 IMO update   Aortic atherosclerosis ([place]) 03/05/2020   Arthritis    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   Atypical chest pain 11/24/2015   Benign essential hypertension 05/13/2009   Qualifier: Diagnosis of  By: Larwance Sachs of this note might be different from the original. 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 Carotid U/S 8/41: LICA < 66%, RICA 06-30%;  b.  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   Cystoid macular edema of both eyes 07/28/2020   Degenerative disc disease, lumbar 06/30/2015   Deviated nasal septum 02/18/2015   Diverticulosis 03/03/2020   DJD (degenerative joint disease) of hip 12/24/2012   Dyspnea 06/12/2011   Emphysema, unspecified ([place]) 03/05/2020   Erectile dysfunction 06/20/2017   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[place]) 02/09/2021   Gait disorder 03/02/2021   GERD (gastroesophageal reflux disease)    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   Qualifier: Diagnosis of  By: Burnett Kanaris     Greater trochanteric pain syndrome 07/22/2021   Hand pain, left 01/16/2022   Hearing loss 02/02/2016   Heme positive stool 02/29/2020   HIATAL HERNIA 05/13/2009   Qualifier: Diagnosis of  By: Burnett Kanaris     High risk medication use 01/18/2012   Hip pain 03/02/2021   History of chicken pox    History of  COVID-19 12/05/2021   History of hemorrhoids    History of kidney stones    History of skin cancer 07/22/2021   skin cancer   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 Cardiology - Dr Frances Nickels at [facility] cardiology  ICD-10 cut over   Hypertension    under control   Hypokalemia 07/08/2021   Hypothyroidism    Internal hemorrhoids 03/03/2020   Leg cramps  11/13/2019   Lower GI bleed 12/12/2012   Lumbago of lumbar region with sciatica 10/21/2020   Lumbar radiculopathy 03/02/2021   Lumbar spondylosis 11/02/2020   NEPHROLITHIASIS 05/13/2009   Qualifier: Diagnosis of  By: Burnett Kanaris     Nonspecific abnormal electrocardiogram (ECG) (EKG) 01/06/2013   Numbness of hand 01/24/2022   Onychomycosis 06/30/2015   Osteoarthrosis, 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 Osteoarthritis Of The Hand  10/1 IMO update   Osteoarthrosis, unspecified whether generalized or localized, pelvic region and thigh 07/25/2010   Formatting of this note might be different from the original. Osteoarthritis Of The Hip   Other abnormal glucose 07/22/2021   Other allergic rhinitis 02/18/2015   Palpitations 07/25/2021   Peripheral neuropathy 06/19/2016   Preventative health care 11/24/2015   Right groin pain 11/29/2012   Right inguinal hernia 05/13/2009   Qualifier: Diagnosis of  By: Burnett Kanaris     RLS (restless legs syndrome) 02/18/2015   Rosacea    Rotator cuff tear 07/27/2013   Situational depression 03/02/2021   Skin lesion 01/16/2022   Sleep apnea with use of continuous positive airway pressure (CPAP) 07/15/2013   CPAP set to  7 cm water,  Residual AHi was 7.8  With no leak.  User time 6 hours.  479 days , not used only 12 days - highly compliant 06-23-13  .    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   Urinary retention 11/26/2020   Ventral hernia 07/08/2012   Vertigo 07/08/2021   Weight loss 03/03/2020    Past Surgical History:  Procedure Laterality Date   ABDOMINAL HERNIA  REPAIR  07/15/12   Dr [name]   ANTERIOR CERVICAL DECOMP/DISCECTOMY FUSION N/A 07/09/2020   Procedure: ANTERIOR CERVICAL DECOMPRESSION AND FUSION CERVICAL THREE-FOUR.;  Surgeon: [name], MD;  Location: [facility];  Service: Neurosurgery;  Laterality: N/A;  anterior   BROW LIFT  05/07/01   COLONOSCOPY WITH PROPOFOL N/A 04/24/2022   Procedure: COLONOSCOPY WITH PROPOFOL;  Surgeon: Thornton Park, MD;  Location: [facility];  Service: Gastroenterology;  Laterality: N/A;   COLONOSCOPY WITH PROPOFOL N/A 04/26/2022   Procedure: COLONOSCOPY WITH PROPOFOL;  Surgeon: Thornton Park, MD;  Location: [facility];  Service: Gastroenterology;  Laterality: N/A;   CYSTOSCOPY WITH URETEROSCOPY AND STENT PLACEMENT Right 01/28/2014   Procedure: CYSTOSCOPY WITH URETEROSCOPY, BASKET RETRIVAL AND  STENT PLACEMENT;  Surgeon: [name], MD;  Location: [facility];  Service: Urology;  Laterality: Right;   epidural injections     multiple procedures   ESOPHAGEAL DILATION  1993 and 1994   multiple times  EYE SURGERY Bilateral 03/25/10, 2012   cataract removal   HEMORRHOID SURGERY  2014   HIATAL HERNIA REPAIR  12/21/93   HOLMIUM LASER APPLICATION Right 6/60/6301   Procedure: HOLMIUM LASER APPLICATION;  Surgeon: [name], MD;  Location: [facility];  Service: Urology;  Laterality: Right;   INGUINAL HERNIA REPAIR Right 07/15/12   Dr [name], x2   JOINT REPLACEMENT  07/25/04   right knee   JOINT REPLACEMENT  07/13/10   left knee   [place]  09/20/06   with intraoperative cholangiogram and right inguinal herniorrhaphy with mesh    LIGAMENT REPAIR Left 07/2013   shoulder   LITHOTRIPSY     x2   [place]   POLYPECTOMY  04/26/2022   Procedure: POLYPECTOMY;  Surgeon: Thornton Park, MD;  Location: [facility];  Service: Gastroenterology;;   POSTERIOR CERVICAL FUSION/FORAMINOTOMY N/A 11/24/2020   Procedure: Posterior Cervical Laminectomy Cervical three-four,  Cervical four-five with lateral mass fusion;  Surgeon: [name], MD;  Location: [facility];  Service: Neurosurgery;  Laterality: N/A;   REFRACTIVE SURGERY Bilateral    Right total hip replacement  4/14   SKIN CANCER DESTRUCTION     nose, ear   TONSILLECTOMY  as child   TOTAL HIP ARTHROPLASTY Left    URETHRAL DILATION  1991   [name]    Family History  Problem Relation Age of Onset   Cancer Mother    Hypertension Other    Cancer Other    Osteoarthritis Other    Stomach cancer Neg Hx    Colon cancer Neg Hx    Esophageal cancer Neg Hx     Social History   Socioeconomic History   Marital status: Married    Spouse name: Not on file   Number of children: 1   Years of education: Not on file   Highest education level: Not on file  Occupational History   Occupation: firefighter    Comment: paints on the side   Occupation: retired  Tobacco Use   Smoking status: Former    Years: 11.00    Types: Cigarettes    Quit date: 09/11/1978    Years since quitting: 43.9   Smokeless tobacco: Never  Vaping Use   Vaping Use: Never used  Substance and Sexual Activity   Alcohol use: Not Currently    Alcohol/week: 0.0 standard drinks of alcohol    Comment: rare   Drug use: No   Sexual activity: Not Currently  Other Topics Concern   Not on file  Social History Narrative   Lives with his wife   He has one son- lives locally.   2 grandchildren   Has worked for Research officer, trade union   Enjoys wood working   Completed HS.  [employer]   Social Determinants of Health   Financial Resource Strain: Low Risk  (10/28/2021)   Overall Financial Resource Strain (CARDIA)    Difficulty of Paying Living Expenses: Not very hard  Food Insecurity: No Food Insecurity (06/05/2022)   Hunger Vital Sign    Worried About Running Out of Food in the Last Year: Never true    Ran Out of Food in the Last Year: Never true  Transportation Needs: Unknown (06/05/2022)   PRAPARE - Hydrologist  (Medical): No    Lack of Transportation (Non-Medical): Not on file  Physical Activity: Insufficiently Active (10/28/2021)   Exercise Vital Sign    Days  of Exercise per Week: 2 days    Minutes of Exercise per Session: 30 min  Stress: No Stress Concern Present (01/19/2022)   [place]    Feeling of Stress : Only a little  Social Connections: Moderately Isolated (01/19/2022)   Social Connection and Isolation Panel [NHANES]    Frequency of Communication with Friends and Family: More than three times a week    Frequency of Social Gatherings with Friends and Family: More than three times a week    Attends Religious Services: Never    Marine scientist or Organizations: No    Attends Archivist Meetings: Never    Marital Status: Married  Human resources officer Violence: Not At Risk (01/19/2022)   Humiliation, Afraid, Rape, and Kick questionnaire    Fear of Current or Ex-Partner: No    Emotionally Abused: No    Physically Abused: No    Sexually Abused: No    Outpatient Medications Prior to Visit  Medication Sig Dispense Refill   acetaminophen (TYLENOL) 650 MG CR tablet Take 650-1,300 mg by mouth every 8 (eight) hours as needed for pain.     albuterol (VENTOLIN HFA) 108 (90 Base) MCG/ACT inhaler INHALE 2 PUFFS INTO THE LUNGS EVERY 6 HOURS AS NEEDED FOR SHORTNESS OF BREATH. (Patient taking differently: Inhale 2 puffs into the lungs every 6 (six) hours as needed for shortness of breath.) 1 each 3   allopurinol (ZYLOPRIM) 100 MG tablet Take 1 tablet (100 mg total) by mouth 2 (two) times daily. 180 tablet 1   antiseptic oral rinse (BIOTENE) LIQD 15 mLs by Mouth Rinse route in the morning.     aspirin EC 81 MG tablet Take 81 mg by mouth at bedtime.     cycloSPORINE (RESTASIS) 0.05 % ophthalmic emulsion Place 1 drop into both eyes 2 (two) times daily.     diclofenac Sodium (VOLTAREN) 1 % GEL Apply 2 g topically daily as needed  (for arthritic pain in the hands).     docusate sodium (COLACE) 100 MG capsule Take 1 capsule (100 mg total) by mouth 2 (two) times daily. 60 capsule 11   ergocalciferol (VITAMIN D2) 1.25 MG (50000 UT) capsule Take 1 capsule (50,000 Units total) by mouth once a week. 8 capsule 0   gabapentin (NEURONTIN) 300 MG capsule Take 1 capsule by mouth at bedtime.     GEMTESA 75 MG TABS Take 75 mg by mouth daily.     glucose blood test strip TRUE Metrix Blood Glucose Test Strip, use as instructed 100 each 12   Homeopathic Products (THERAWORX RELIEF) FOAM Apply 1 application topically 2 (two) times daily as needed (Pain).     hydrALAZINE (APRESOLINE) 50 MG tablet Take 1 tablet (50 mg total) by mouth 2 (two) times daily. 180 tablet 1   Iron, Ferrous Sulfate, 325 (65 Fe) MG TABS Take 325 mg by mouth every other day. 30 tablet 0   Lancets MISC 1 each by Does not apply route 2 (two) times daily. TRUE matrix lancets 100 each 3   levothyroxine (SYNTHROID) 150 MCG tablet Take 150 mcg 6 days a week (patient takes 75 mcg once a week) Disp #84 for 90 day supply 84 tablet 1   levothyroxine (SYNTHROID) 75 MCG tablet Take 75 mcg once a week (patient on 150 mcg 6 days a week) Dispense #24 for 90 day supply. 12 tablet 1   loratadine (CLARITIN) 10 MG tablet Take 10 mg by mouth  daily as needed for allergies or rhinitis.     losartan (COZAAR) 100 MG tablet TAKE 1 TABLET EVERY DAY 90 tablet 1   Magnesium 500 MG TABS Take 500 mg by mouth daily.     Multiple Vitamins-Minerals (MULTIVITAMIN WITH MINERALS) tablet Take 1 tablet by mouth daily.     nitroGLYCERIN (NITROSTAT) 0.4 MG SL tablet Place 1 tablet (0.4 mg total) under the tongue every 5 (five) minutes as needed for chest pain. 25 tablet 6   ondansetron (ZOFRAN-ODT) 4 MG disintegrating tablet Take 1 tablet (4 mg total) by mouth every 8 (eight) hours as needed for nausea or vomiting. 10 tablet 0   polyethylene glycol (MIRALAX / GLYCOLAX) 17 g packet Take 17-34 g by mouth every  Monday, Wednesday, and Friday.     polyvinyl alcohol (ARTIFICIAL TEARS) 1.4 % ophthalmic solution Place 1 drop into both eyes as needed for dry eyes.     PRESCRIPTION MEDICATION CPAP- At bedtime and during any time of rest     Probiotic Product (PROBIOTIC DAILY PO) Take 1 capsule by mouth daily.     rosuvastatin (CRESTOR) 5 MG tablet Take 1 tablet (5 mg total) by mouth daily. 90 tablet 1   sodium chloride (OCEAN) 0.65 % nasal spray Place 1 spray into both nostrils as needed for congestion.     traMADol (ULTRAM) 50 MG tablet Take 1 tablet (50 mg total) by mouth every 12 (twelve) hours as needed. 10 tablet 0   vitamin B-12 (CYANOCOBALAMIN) 500 MCG tablet Take 500 mcg by mouth daily.     amLODipine (NORVASC) 2.5 MG tablet Take 1 tablet (2.5 mg total) by mouth daily. 90 tablet 1   No facility-administered medications prior to visit.    Allergies  Allergen Reactions   Pravastatin Other (See Comments)    Muscle cramps   Sildenafil Other (See Comments)    Tachycardia     Oxycodone Other (See Comments)    Hallucinations    Atenolol Other (See Comments)    Bradycardia    Elastic Bandages & [Zinc] Other (See Comments)    Teflon bandages - Irritation   Metoclopramide Hcl Anxiety    ROS    See HPI Objective:    Physical Exam Constitutional:      General: He is not in acute distress.    Appearance: Normal appearance. He is not ill-appearing.  HENT:     Head: Normocephalic and atraumatic.     Right Ear: External ear normal.     Left Ear: External ear normal.  Eyes:     Extraocular Movements: Extraocular movements intact.     Pupils: Pupils are equal, round, and reactive to light.  Cardiovascular:     Rate and Rhythm: Normal rate and regular rhythm.     Heart sounds: Normal heart sounds. No murmur heard.    No gallop.  Pulmonary:     Effort: Pulmonary effort is normal. No respiratory distress.     Breath sounds: Normal breath sounds. No wheezing or rales.  Skin:    General:  Skin is warm and dry.  Neurological:     Mental Status: He is alert and oriented to person, place, and time.  Psychiatric:        Mood and Affect: Mood normal.        Behavior: Behavior normal.        Judgment: Judgment normal.     BP (!) 175/65   Pulse 69   Temp 97.9 F (36.6 C) (Oral)  Resp 16   Wt 149 lb (67.6 kg)   SpO2 98%   BMI 23.34 kg/m  Wt Readings from Last 3 Encounters:  08/28/22 149 lb (67.6 kg)  08/07/22 154 lb (69.9 kg)  06/16/22 153 lb (69.4 kg)       Assessment & Plan:   Problem List Items Addressed This Visit       Unprioritized   S/P revision of total hip - Primary   Relevant Orders   Ambulatory referral to Physical Therapy   Hypertension    BP Readings from Last 3 Encounters:  08/28/22 (!) 175/65  08/07/22 (!) 117/48  06/16/22 126/72  Uncontrolled. Home readings look similar to today's reading. Will increase amlodipine from 2.5 mg to 5 mg.       Relevant Medications   amLODipine (NORVASC) 5 MG tablet   Meds ordered this encounter  Medications   amLODipine (NORVASC) 5 MG tablet    Sig: Take 1 tablet (5 mg total) by mouth daily.    Dispense:  90 tablet    Refill:  1    I, [name], personally preformed the services described in this documentation.  All medical record entries made by the scribe were at my direction and in my presence.  I have reviewed the chart and discharge instructions (if applicable) and agree that the record reflects my personal performance and is accurate and complete. 08/28/2022.   I,Verona Buck,acting as a Education administrator for [employer], NP.,have documented all relevant documentation on the behalf of Nance Pear, NP,as directed by  Nance Pear, NP while in the presence of Nance Pear, NP.    Nance Pear, NP

## 2022-08-29 NOTE — Therapy (Signed)
OUTPATIENT PHYSICAL THERAPY LOWER EXTREMITY EVALUATION   Patient Name: [name] Pratt MRN: [id number] DOB:Jan 22, 1941, 81 y.o., male Today's Date: 09/05/2022   END OF SESSION:  PT End of Session - 09/05/22 1101     Visit Number 1    Date for PT Re-Evaluation 10/31/22    Authorization Type [name]    PT Start Time 1101    PT Stop Time 1152    PT Time Calculation (min) 51 min    Activity Tolerance Patient tolerated treatment well    Behavior During Therapy [facility] for tasks assessed/performed             Past Medical History:  Diagnosis Date   Allergic rhinitis 02/02/2016   Anemia 12/17/2009   Formatting of this note might be different from the original. Anemia  10/1 IMO update   Aortic atherosclerosis ([place]) 03/05/2020   Arthritis    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   Atypical chest pain 11/24/2015   Benign essential hypertension 05/13/2009   Qualifier: Diagnosis of  By: Larwance Sachs of this note might be different from the original. Hypertension   Benign paroxysmal positional vertigo 09/14/2021   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[place]) 07/22/2021   Body mass index (BMI) 25.0-25.9, adult 11/02/2020   Cancer ([place])    skin cancer   Cardiac murmur 07/25/2021   Carotid stenosis    a. Carotid U/S 3/66: LICA < 44%, RICA 03-47%;  b.  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   Cystoid macular edema of both eyes 07/28/2020   Degenerative disc disease, lumbar 06/30/2015   Deviated nasal septum 02/18/2015   Diverticulosis 03/03/2020   DJD (degenerative joint disease) of hip 12/24/2012    Dyspnea 06/12/2011   Emphysema, unspecified ([place]) 03/05/2020   Erectile dysfunction 06/20/2017   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[place]) 02/09/2021   Gait disorder 03/02/2021   GERD (gastroesophageal reflux disease)    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   Qualifier: Diagnosis of  By: Burnett Kanaris     Greater trochanteric pain syndrome 07/22/2021   Hand pain, left 01/16/2022   Hearing loss 02/02/2016   Heme positive stool 02/29/2020   HIATAL HERNIA 05/13/2009   Qualifier: Diagnosis of  By: Burnett Kanaris     High risk medication use 01/18/2012   Hip pain 03/02/2021   History of chicken pox    History of COVID-19 12/05/2021   History of hemorrhoids    History of kidney stones    History of skin cancer 07/22/2021   skin cancer   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 Cardiology - Dr Frances Nickels at [facility] cardiology  ICD-10 cut over   Hypertension    under control   Hypokalemia 07/08/2021   Hypothyroidism    Internal hemorrhoids 03/03/2020   Leg cramps 11/13/2019   Lower GI bleed 12/12/2012   Lumbago of lumbar region with sciatica 10/21/2020   Lumbar radiculopathy 03/02/2021   Lumbar spondylosis 11/02/2020  NEPHROLITHIASIS 05/13/2009   Qualifier: Diagnosis of  By: Burnett Kanaris     Nonspecific abnormal electrocardiogram (ECG) (EKG) 01/06/2013   Numbness of hand 01/24/2022   Onychomycosis 06/30/2015   Osteoarthrosis, 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 Osteoarthritis Of The Hand  10/1 IMO update   Osteoarthrosis, unspecified whether generalized or localized, pelvic region and thigh 07/25/2010   Formatting of this note might be different from the original. Osteoarthritis Of The Hip   Other abnormal glucose 07/22/2021    Other allergic rhinitis 02/18/2015   Palpitations 07/25/2021   Peripheral neuropathy 06/19/2016   Preventative health care 11/24/2015   Right groin pain 11/29/2012   Right inguinal hernia 05/13/2009   Qualifier: Diagnosis of  By: Burnett Kanaris     RLS (restless legs syndrome) 02/18/2015   Rosacea    Rotator cuff tear 07/27/2013   Situational depression 03/02/2021   Skin lesion 01/16/2022   Sleep apnea with use of continuous positive airway pressure (CPAP) 07/15/2013   CPAP set to  7 cm water,  Residual AHi was 7.8  With no leak.  User time 6 hours.  479 days , not used only 12 days - highly compliant 06-23-13  .    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   Urinary retention 11/26/2020   Ventral hernia 07/08/2012   Vertigo 07/08/2021   Weight loss 03/03/2020   Past Surgical History:  Procedure Laterality Date   ABDOMINAL HERNIA REPAIR  07/15/12   Dr [name]   ANTERIOR CERVICAL DECOMP/DISCECTOMY FUSION N/A 07/09/2020   Procedure: ANTERIOR CERVICAL DECOMPRESSION AND FUSION CERVICAL THREE-FOUR.;  Surgeon: [name], MD;  Location: [place];  Service: Neurosurgery;  Laterality: N/A;  anterior   BROW LIFT  05/07/01   COLONOSCOPY WITH PROPOFOL N/A 04/24/2022   Procedure: COLONOSCOPY WITH PROPOFOL;  Surgeon: Thornton Park, MD;  Location: [place];  Service: Gastroenterology;  Laterality: N/A;   COLONOSCOPY WITH PROPOFOL N/A 04/26/2022   Procedure: COLONOSCOPY WITH PROPOFOL;  Surgeon: Thornton Park, MD;  Location: [place];  Service: Gastroenterology;  Laterality: N/A;   CYSTOSCOPY WITH URETEROSCOPY AND STENT PLACEMENT Right 01/28/2014   Procedure:  CYSTOSCOPY WITH URETEROSCOPY, BASKET RETRIVAL AND  STENT PLACEMENT;  Surgeon: [name], MD;  Location: [facility];  Service: Urology;  Laterality: Right;   epidural injections     multiple procedures   ESOPHAGEAL DILATION  1993 and 1994   multiple times   EYE SURGERY Bilateral 03/25/10, 2012   cataract removal   HEMORRHOID SURGERY  2014   HIATAL HERNIA REPAIR  12/21/93   HOLMIUM LASER APPLICATION Right 04/19/9832   Procedure: HOLMIUM LASER APPLICATION;  Surgeon: [name], MD;  Location: [facility];  Service: Urology;  Laterality: Right;   INGUINAL HERNIA REPAIR Right 07/15/12   Dr [name], x2   JOINT REPLACEMENT  07/25/04   right knee   JOINT REPLACEMENT  07/13/10   left knee   [place]  09/20/06   with intraoperative cholangiogram and right inguinal herniorrhaphy with mesh    LIGAMENT REPAIR Left 07/2013   shoulder   LITHOTRIPSY     x2  NASAL SEPTUM SURGERY  1975   POLYPECTOMY  04/26/2022   Procedure: POLYPECTOMY;  Surgeon: Thornton Park, MD;  Location: [facility];  Service: Gastroenterology;;   POSTERIOR CERVICAL FUSION/FORAMINOTOMY N/A 11/24/2020   Procedure: Posterior Cervical Laminectomy Cervical three-four, Cervical four-five with lateral mass fusion;  Surgeon: [name], MD;  Location: [place];  Service: Neurosurgery;  Laterality: N/A;   REFRACTIVE SURGERY Bilateral    Right total hip replacement  4/14   SKIN CANCER DESTRUCTION     nose, ear   TONSILLECTOMY  as child   TOTAL HIP ARTHROPLASTY Left    URETHRAL DILATION  1991   [name]   Patient Active Problem List   Diagnosis Date Noted   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   Vitamin D deficiency 08/07/2022   Closed fracture of left hip ([place]) 08/07/2022   Hand weakness 05/02/2022   Acute kidney injury ([place]) 05/02/2022   Adenomatous polyp of ascending colon    Adenomatous polyp of colon    Acute metabolic encephalopathy 28/36/6294   GI bleed 04/21/2022   Vasovagal  syncope    Hemorrhagic shock (HCC)    Symptomatic anemia    Hematochezia    Acute blood loss anemia    Irregular heart beat 02/03/2022   Constipation 02/03/2022   Carpal tunnel syndrome 01/24/2022   Numbness of hand 01/24/2022   Ulnar neuropathy 01/24/2022   Skin lesion 01/16/2022   History of COVID-19 12/05/2021   Benign paroxysmal positional vertigo 09/14/2021   Palpitations 07/25/2021   Cardiac murmur 07/25/2021   History of chicken pox 07/22/2021   History of hemorrhoids 07/22/2021   Hypertension 07/22/2021   COPD (chronic obstructive pulmonary disease) ([place]) 07/22/2021   History of kidney stones 07/22/2021   History of skin cancer 76/54/6503   Complication of anesthesia 07/22/2021   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[place]) 07/22/2021   Other abnormal glucose 07/22/2021   Spondylitis ([place]) 07/22/2021   Greater trochanteric pain syndrome 07/22/2021   Vertigo 07/08/2021   Hypokalemia 07/08/2021   Trochanteric bursitis of left hip 06/30/2021   Cancer ([place]) 04/11/2021   Lumbar radiculopathy 03/02/2021   Gait instability 03/02/2021   Hip pain 03/02/2021   Situational depression 03/02/2021   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[place]) 02/09/2021   Urinary retention 11/26/2020   Cervical myelopathy ([place]) 11/24/2020   Body mass index (BMI) 25.0-25.9, adult 11/02/2020   Lumbar spondylosis 11/02/2020   Lumbago of lumbar region with sciatica 10/21/2020   Cystoid macular edema of both eyes 07/28/2020   Status post cervical spinal fusion 07/09/2020   History of total bilateral knee replacement 07/08/2020   Aortic atherosclerosis ([place]) 03/05/2020   Emphysema, unspecified ([place]) 03/05/2020   Arthritis 03/04/2020   Diverticulosis 03/03/2020   Internal hemorrhoids 03/03/2020   Weight loss 03/03/2020   Heme positive stool 02/29/2020   Leg cramps 11/13/2019   Tibialis anterior tendon tear, nontraumatic 11/13/2019   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 07/29/2018   CPAP (continuous positive airway pressure) dependence 03/25/2018   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   Erectile dysfunction 06/20/2017   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 03/06/2017   Peripheral neuropathy 06/19/2016   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   Hearing loss 02/02/2016   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   Allergic rhinitis 02/02/2016   Atypical chest pain 11/24/2015   Preventative health care 11/24/2015   Onychomycosis 06/30/2015   Degenerative disc disease, lumbar 06/30/2015   Other allergic rhinitis  02/18/2015   Deviated nasal septum 02/18/2015   RLS (restless legs syndrome) 02/18/2015   GERD (gastroesophageal reflux disease) 09/18/2014   Hyperglycemia 03/03/2014   Rotator cuff tear 07/27/2013   Sleep apnea with use of continuous positive airway pressure (CPAP) 07/15/2013   Carotid stenosis 02/24/2013   Nonspecific abnormal electrocardiogram (ECG) (EKG) 01/06/2013   DJD (degenerative joint disease) of hip 12/24/2012   Lower GI bleed 12/12/2012   Right groin pain 11/29/2012   OSA (obstructive sleep apnea) 11/25/2012   Ventral hernia 07/08/2012   High risk medication use 01/18/2012   Dyspnea 06/12/2011   Hyperlipidemia with target LDL less than 130 12/27/2010   Osteoarthrosis, unspecified whether generalized or localized, pelvic region and thigh 07/25/2010   Osteoarthrosis, hand 06/13/2010   Anemia 12/17/2009   Hypothyroidism 05/13/2009   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   Benign essential hypertension 05/13/2009   Right inguinal hernia 05/13/2009   HIATAL HERNIA 05/13/2009   NEPHROLITHIASIS 05/13/2009   ROSACEA 05/13/2009    PCP: [name], NP   REFERRING PROVIDER: [name], NP   REFERRING DIAG: [phone] (ICD-10-CM) - S/P revision of total hip  PT eval and treat for aggressive left knee ROM, weight bearing as tolerated, balance/gait training. S/p revision of  left hip replacement at [facility]. (Dr. Linton Rump). No precautions.  THERAPY DIAG:  Stiffness of left knee, not elsewhere classified  Muscle weakness (generalized)  Other abnormalities of gait and mobility  Unsteadiness on feet  Repeated falls  Other low back pain  RATIONALE FOR EVALUATION AND TREATMENT: Rehabilitation  ONSET DATE: 07/02/22  NEXT MD VISIT: 09/18/22   SUBJECTIVE:                                                                                                                                                                                                         SUBJECTIVE STATEMENT: Pt referred to PT for PT eval and treat for aggressive left knee ROM, weight bearing as tolerated, balance/gait training s/p revision of left hip replacement at [facility] with Dr. Linton Rump (No precautions). Fall secondary to trip on coffee table on 07/01/22 with left periprosthetic fracture around internal prosthetic hip joint s/p total hip revision of femoral component on 07/02/22. Was initially in L knee immobilizer post-op and his knee stiffed up to the point where he has difficulty bending. He went to SNF for rehab followed by [facility] PT. He currently uses a RW 90% of the time in the home but otherwise uses his cane when going outside due to difficulty maneuvering the RW on uneven ground.  He had previously been  scheduled for lumbar spine surgery on 07/12/2022 for right laminectomy and foraminotomy L2-L3 & L3-L4 which was canceled due to hip fracture and subsequent surgery.  PAIN: Are you having pain? Yes: NPRS scale: 7/10 Pain location: B low back and radiates into B groin Pain description: aching, intermittent Aggravating factors: first getting moving in the morning, after prolonged sitting or walking with cane Relieving factors: sitting up straight  PERTINENT HISTORY:  Revision of femoral component of L THA on 07/02/22 s/p fall with periprosthetic fracture; Aortic atherosclerosis; carotid stenosis;  cervical myelopathy s/p ACDF 06/2020 and posterior cervical fusion 11/2020; peripheral neuropathy; lumbar DDD/spondylosis; lumbago with sciatica/lumbar radiculopathy; OA; B TKA; BTHA; L greater trochanteric pain syndrome; L RCR 2014; recent CTR surgery - early May; hearing loss; HTN; BPPV/vertigo; B sacroiliitis; skin cancer; COPD/emphysema; sleep apnea with CPAP; GERD; gout; HLD; LE cramps; RLS   PRECAUTIONS: Fall  WEIGHT BEARING RESTRICTIONS: No - FWB on left as of 08/18/22  FALLS:  Has patient fallen in last 6 months? Yes. 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 The proximal femur fracture looks healed as I cannot see the fracture line in any view. Good leg lengths.  Right hip replacement is cementless and looks well-fixed with a little varus of the tibial stem.  No bony lesion or arthritic change or other abnormality except some lumbar disc degeneration   07/21/2022 - ORTHO XR PELVIS 2 OR 3 VW UNILATERAL HIP: FINDINGS: AP and lateral views of left hip were obtained and interpreted.  Status post new left total hip revision with wire  fixation appearing in good position. Good limb length. No new fracture, bony lesion, or other abnormal findings.  07/21/2022 - ORTHO XR FEMUR 2 VW UNILATERAL: FINDINGS: 3 views of the left femur were obtained and interpreted. Status post new left total hip revision with wire fixation in good position. Previous left total knee arthroplasty. No new fracture, bony lesion, or other abnormal findings.   07/02/2022 - DG HIP (WITH OR WITHOUT PELVIS) 2-3V LEFT :  FINDINGS: Three fluoroscopic spot views of the hip obtained in the operating room. Cerclage wire fixation of periprosthetic fracture, in improved alignment. Fluoroscopy time 12 seconds. Dose 1.23 mGy. IMPRESSION: Fluoroscopic spot views of the hip during periprosthetic fracture fixation.   07/01/2022 - XR THIGH/FEMUR LEFT 2V & XR PELVIS LIMITED 1 OR 2 VIEWS AP: FINDINGS: Degenerative changes in the lower lumbar spine. Pelvis is intact. SI joints and symphysis pubis are not displaced. Right total hip arthroplasty with non cemented components. Tip of the arthroplasty is not included within the field of view. Visualized  components appear well seated. Left total hip arthroplasty. The left hip demonstrates an acute periprosthetic fracture of the proximal femur with lateral displacement and mild distraction of the distal fracture fragment. No evidence of dislocation of the prosthesis. The midshaft and distal left femur are intact. Postoperative left knee arthroplasty. IMPRESSION: 1. Left hip arthroplasty with periprosthetic fracture of the proximal femur. 2. Incidental note of right hip and left knee arthroplasties.   PATIENT SURVEYS:  LEFS 19 / 80 = 23.8 %  COGNITION: Overall cognitive status: Within functional limits for tasks assessed    SENSATION: Peripheral neuropathy in B feet    EDEMA:  Mild to moderate edema in distal B LE, L>R  MUSCLE LENGTH: TBA next visit Hamstrings:  ITB:  Piriformis:  Hip flexors:  Quads:  Heelcord:   POSTURE:   rounded shoulders, forward head, flexed trunk , and weight shift right  PALPATION: Increased muscle tension throughout left quads and IT band as well as hamstrings  LOWER EXTREMITY ROM:  Active ROM Right eval Left eval  Hip flexion    Hip extension    Hip abduction    Hip adduction    Hip internal rotation    Hip external rotation    Knee flexion 115 92  Knee extension 0 0  Ankle dorsiflexion  limited  Ankle plantarflexion    Ankle inversion    Ankle eversion     Passive ROM Left eval  Knee flexion 95  (Blank rows = not tested)  LOWER EXTREMITY MMT:  MMT Right Eval* Left Eval*  Hip flexion 3+ 3+  Hip extension 4- 4-  Hip abduction 4 4-  Hip adduction 4 4  Hip internal rotation    Hip external rotation    Knee flexion 4 4  Knee extension 4+ 4+  Ankle dorsiflexion 4+ 4  Ankle plantarflexion    Ankle inversion    Ankle eversion     (Blank rows = not tested, * - tested in sitting)  FUNCTIONAL TESTS:  5 times sit to stand: TBA Timed up and go (TUG): 21.28 sec with SPC 10 meter walk test: 19.94 sec with SPC; Gait speed = 1.63 ft/sec Berg Balance Scale: TBA Dynamic Gait Index: TBA  PLOF (As of 06/20/22): ABC scale: 730 / 1600 = 45.6% Berg: 48/56; 46-51 moderate fall risk (>50%)  10MWT = 11.50 sec with SPC; 10.06 sec w/o AD Gait speed: 2.85 ft/sec with SPC; 3.26 ft/sec w/o AD DGI: 18/24; Scores of 19 or less are predictive of falls in older community living adults  GAIT: Distance walked: 60 ft Assistive device utilized: Single point cane Level of assistance: SBA Gait pattern: step to pattern, step through pattern, decreased step length- Right, decreased stance time- Left, decreased hip/knee flexion- Right, and decreased hip/knee flexion- Left Comments: Inconsistent step length on right creating a barrier to versus step through pattern.  Notable gait instability with SPC.   TODAY'S TREATMENT:   09/05/22 Eval only   PATIENT EDUCATION:  Education  details: PT eval findings, anticipated POC, and need for further assessment of balance with standing and gait   Person educated: Patient Education method: Explanation Education comprehension: verbalized understanding  HOME EXERCISE PROGRAM: TBD   ASSESSMENT:  CLINICAL IMPRESSION: RENSO SWETT is a 81 y.o. male  who was seen today for physical therapy evaluation and treatment for L left knee stiffness as well as balance/gait training s/p revision of left hip replacement on 07/02/22 after a fall resulting in a periprosthetic hip fracture.  Patient was initially at a wheelchair mobility level postop due to restricted weightbearing status with left knee in an immobilizer.  He has been advanced to weightbearing as tolerated with the knee immobilizer now discontinued, however he continues to have restricted left knee flexion range of motion which limits his to address his lower body as well as impaired mobility and unsteady gait.  Current deficits include low back pain with intermittent radiculopathy, decreased left hip and knee ROM, impaired flexibility, increased muscle tension in right quads and proximal LE,  core and LE weakness, impaired mobility and gait with increased fall risk.  Further balance testing indicated which will be completed next visit. Taris "[name]" will benefit from skilled PT to address above deficits to improve mobility and activity tolerance with decreased pain interference and reduced risk for falls.  OBJECTIVE IMPAIRMENTS: Abnormal gait, decreased activity tolerance, decreased balance, decreased coordination, decreased endurance, decreased knowledge of condition, decreased knowledge of use of DME, decreased mobility, difficulty walking, decreased ROM, decreased strength, decreased safety awareness, increased fascial restrictions, impaired perceived functional ability, impaired flexibility, impaired sensation, improper body mechanics, postural dysfunction, and pain.    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   REHAB POTENTIAL: Good  CLINICAL DECISION MAKING: Evolving/moderate complexity  EVALUATION COMPLEXITY: Moderate   GOALS: Goals reviewed with patient? Yes  SHORT TERM GOALS: Target date: 10/03/2022   Patient will be independent with initial HEP. Baseline:  Goal status: INITIAL  2.  Complete balance testing and establish additional LTGs as appropriate. Baseline:  Goal status: INITIAL  3.  Patient will demonstrate decreased TUG time to </= 16 sec to decrease risk for falls with transitional mobility Baseline: 21.28 sec Goal status: INITIAL  LONG TERM GOALS: Target date: 10/31/2022   Patient will be independent with advanced/ongoing HEP to improve outcomes and carryover.  Baseline:  Goal status: INITIAL  2.  Patient will report at least 50-75% improvement in low back and Lhip pain to improve QOL. Baseline: 7/10 Goal status: INITIAL  3.  Patient will demonstrate improved L knee AROM to [facility] to allow for normal gait and stair mechanics. Baseline: AROM 0-92, PROM 0-95 Goal status:  INITIAL  4.  Patient will demonstrate improved B LE strength to >/= 4 to 4+/5 for improved stability and ease of mobility. Baseline:  Goal status: INITIAL  5.  Patient will be able to ambulate 600' with LRAD and normal gait pattern, good stability and without increased pain to safely access community.  Baseline:  Goal status: INITIAL  6. Patient will be able to ascend/descend stairs with 1 HR and reciprocal step pattern safely to access home and community.  Baseline:  Goal status: INITIAL  7.  Patient will report >/=28/80 on LEFS (patient reported outcome measure) to demonstrate improved functional ability. Baseline: 19 / 80 = 23.8 % Goal status: INITIAL  8.  Patient  will demonstrate at least 19/24 on DGI to decrease risk of falls. Baseline: TBD Goal status: INITIAL   9.  Patient will improve Berg score by at least 8 points to improve safety and stability with ADLs in standing and reduce risk for falls Baseline: TBD Goal status: INITIAL   PLAN:  PT FREQUENCY: 2x/week  PT DURATION: 8 weeks  PLANNED INTERVENTIONS: Therapeutic exercises, Therapeutic activity, Neuromuscular re-education, Balance training, Gait training, Patient/Family education, Self Care, Joint mobilization, Stair training, DME instructions, Dry Needling, Electrical stimulation, Cryotherapy, Moist heat, scar mobilization, Taping, Ultrasound, Ionotophoresis '4mg'$ /ml Dexamethasone, Manual therapy, and Re-evaluation  PLAN FOR NEXT SESSION: Complete balance testing with 5xSTS, Berg and DGI; assess LE flexibility; assess gait with rolling walker versus rollator; create initial HEP - L knee ROM, LE flexibility and strengthening   Percival Spanish, PT 09/05/2022, 1:53 PM

## 2022-08-30 ENCOUNTER — Telehealth: Payer: Self-pay | Admitting: Family

## 2022-08-30 NOTE — Telephone Encounter (Signed)
[facility] just wanted to advise they are discharging pt from PT due to him starting outpatient rehab.

## 2022-09-05 ENCOUNTER — Ambulatory Visit: Payer: Medicare HMO | Attending: Family | Admitting: Physical Therapy

## 2022-09-05 ENCOUNTER — Encounter: Payer: Self-pay | Admitting: Physical Therapy

## 2022-09-05 ENCOUNTER — Other Ambulatory Visit: Payer: Self-pay

## 2022-09-05 DIAGNOSIS — M25662 Stiffness of left knee, not elsewhere classified: Secondary | ICD-10-CM | POA: Insufficient documentation

## 2022-09-05 DIAGNOSIS — R2681 Unsteadiness on feet: Secondary | ICD-10-CM | POA: Diagnosis not present

## 2022-09-05 DIAGNOSIS — M5459 Other low back pain: Secondary | ICD-10-CM | POA: Insufficient documentation

## 2022-09-05 DIAGNOSIS — R2689 Other abnormalities of gait and mobility: Secondary | ICD-10-CM | POA: Diagnosis not present

## 2022-09-05 DIAGNOSIS — R296 Repeated falls: Secondary | ICD-10-CM | POA: Insufficient documentation

## 2022-09-05 DIAGNOSIS — M6281 Muscle weakness (generalized): Secondary | ICD-10-CM | POA: Diagnosis not present

## 2022-09-05 DIAGNOSIS — Z96649 Presence of unspecified artificial hip joint: Secondary | ICD-10-CM | POA: Insufficient documentation

## 2022-09-07 ENCOUNTER — Encounter: Payer: Self-pay | Admitting: Physical Therapy

## 2022-09-07 ENCOUNTER — Ambulatory Visit: Payer: Medicare HMO | Admitting: Physical Therapy

## 2022-09-07 DIAGNOSIS — M25662 Stiffness of left knee, not elsewhere classified: Secondary | ICD-10-CM

## 2022-09-07 DIAGNOSIS — R2681 Unsteadiness on feet: Secondary | ICD-10-CM

## 2022-09-07 DIAGNOSIS — R296 Repeated falls: Secondary | ICD-10-CM

## 2022-09-07 DIAGNOSIS — M6281 Muscle weakness (generalized): Secondary | ICD-10-CM

## 2022-09-07 DIAGNOSIS — R2689 Other abnormalities of gait and mobility: Secondary | ICD-10-CM

## 2022-09-07 DIAGNOSIS — M5459 Other low back pain: Secondary | ICD-10-CM

## 2022-09-07 DIAGNOSIS — Z96649 Presence of unspecified artificial hip joint: Secondary | ICD-10-CM | POA: Diagnosis not present

## 2022-09-07 NOTE — Therapy (Signed)
OUTPATIENT PHYSICAL THERAPY TREATMENT   Patient Name: CHANDAN FLY MRN: [id number] DOB:06/02/1941, 81 y.o., male Today's Date: 09/07/2022   END OF SESSION:  PT End of Session - 09/07/22 1316     Visit Number 2    Date for PT Re-Evaluation 10/31/22    Authorization Type [name]    PT Start Time 1316    PT Stop Time 1405    PT Time Calculation (min) 49 min    Activity Tolerance Patient tolerated treatment well    Behavior During Therapy [facility] for tasks assessed/performed             Past Medical History:  Diagnosis Date   Allergic rhinitis 02/02/2016   Anemia 12/17/2009   Formatting of this note might be different from the original. Anemia  10/1 IMO update   Aortic atherosclerosis ([place]) 03/05/2020   Arthritis    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   Atypical chest pain 11/24/2015   Benign essential hypertension 05/13/2009   Qualifier: Diagnosis of  By: Larwance Sachs of this note might be different from the original. Hypertension   Benign paroxysmal positional vertigo 09/14/2021   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[place]) 07/22/2021   Body mass index (BMI) 25.0-25.9, adult 11/02/2020   Cancer ([place])    skin cancer   Cardiac murmur 07/25/2021   Carotid stenosis    a. Carotid U/S 8/10: LICA < 17%, RICA 51-02%;  b.  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   Cystoid macular edema of both eyes 07/28/2020   Degenerative disc disease, lumbar 06/30/2015   Deviated nasal septum 02/18/2015   Diverticulosis 03/03/2020   DJD (degenerative joint disease) of hip 12/24/2012   Dyspnea 06/12/2011    Emphysema, unspecified ([place]) 03/05/2020   Erectile dysfunction 06/20/2017   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[place]) 02/09/2021   Gait disorder 03/02/2021   GERD (gastroesophageal reflux disease)    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   Qualifier: Diagnosis of  By: Burnett Kanaris     Greater trochanteric pain syndrome 07/22/2021   Hand pain, left 01/16/2022   Hearing loss 02/02/2016   Heme positive stool 02/29/2020   HIATAL HERNIA 05/13/2009   Qualifier: Diagnosis of  By: Burnett Kanaris     High risk medication use 01/18/2012   Hip pain 03/02/2021   History of chicken pox    History of COVID-19 12/05/2021   History of hemorrhoids    History of kidney stones    History of skin cancer 07/22/2021   skin cancer   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 Cardiology - Dr Frances Nickels at [facility] cardiology  ICD-10 cut over   Hypertension    under control   Hypokalemia 07/08/2021   Hypothyroidism    Internal hemorrhoids 03/03/2020   Leg cramps 11/13/2019   Lower GI bleed 12/12/2012   Lumbago of lumbar region with sciatica 10/21/2020   Lumbar radiculopathy 03/02/2021   Lumbar spondylosis 11/02/2020   NEPHROLITHIASIS 05/13/2009  Qualifier: Diagnosis of  By: Burnett Kanaris     Nonspecific abnormal electrocardiogram (ECG) (EKG) 01/06/2013   Numbness of hand 01/24/2022   Onychomycosis 06/30/2015   Osteoarthrosis, 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 Osteoarthritis Of The Hand  10/1 IMO update   Osteoarthrosis, unspecified whether generalized or localized, pelvic region and thigh 07/25/2010   Formatting of this note might be different from the original. Osteoarthritis Of The Hip   Other abnormal glucose 07/22/2021   Other allergic  rhinitis 02/18/2015   Palpitations 07/25/2021   Peripheral neuropathy 06/19/2016   Preventative health care 11/24/2015   Right groin pain 11/29/2012   Right inguinal hernia 05/13/2009   Qualifier: Diagnosis of  By: Burnett Kanaris     RLS (restless legs syndrome) 02/18/2015   Rosacea    Rotator cuff tear 07/27/2013   Situational depression 03/02/2021   Skin lesion 01/16/2022   Sleep apnea with use of continuous positive airway pressure (CPAP) 07/15/2013   CPAP set to  7 cm water,  Residual AHi was 7.8  With no leak.  User time 6 hours.  479 days , not used only 12 days - highly compliant 06-23-13  .    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   Urinary retention 11/26/2020   Ventral hernia 07/08/2012   Vertigo 07/08/2021   Weight loss 03/03/2020   Past Surgical History:  Procedure Laterality Date   ABDOMINAL HERNIA REPAIR  07/15/12   Dr [name]   ANTERIOR CERVICAL DECOMP/DISCECTOMY FUSION N/A 07/09/2020   Procedure: ANTERIOR CERVICAL DECOMPRESSION AND FUSION CERVICAL THREE-FOUR.;  Surgeon: [name], MD;  Location: [place];  Service: Neurosurgery;  Laterality: N/A;  anterior   BROW LIFT  05/07/01   COLONOSCOPY WITH PROPOFOL N/A 04/24/2022   Procedure: COLONOSCOPY WITH PROPOFOL;  Surgeon: Thornton Park, MD;  Location: [place];  Service: Gastroenterology;  Laterality: N/A;   COLONOSCOPY WITH PROPOFOL N/A 04/26/2022   Procedure: COLONOSCOPY WITH PROPOFOL;  Surgeon: Thornton Park, MD;  Location: [place];  Service: Gastroenterology;  Laterality: N/A;   CYSTOSCOPY WITH URETEROSCOPY AND STENT PLACEMENT Right 01/28/2014   Procedure: CYSTOSCOPY WITH  URETEROSCOPY, BASKET RETRIVAL AND  STENT PLACEMENT;  Surgeon: [name], MD;  Location: [facility];  Service: Urology;  Laterality: Right;   epidural injections     multiple procedures   ESOPHAGEAL DILATION  1993 and 1994   multiple times   EYE SURGERY Bilateral 03/25/10, 2012   cataract removal   HEMORRHOID SURGERY  2014   HIATAL HERNIA REPAIR  12/21/93   HOLMIUM LASER APPLICATION Right 2/68/3419   Procedure: HOLMIUM LASER APPLICATION;  Surgeon: [name], MD;  Location: [facility];  Service: Urology;  Laterality: Right;   INGUINAL HERNIA REPAIR Right 07/15/12   Dr [name], x2   JOINT REPLACEMENT  07/25/04   right knee   JOINT REPLACEMENT  07/13/10   left knee   [place]  09/20/06   with intraoperative cholangiogram and right inguinal herniorrhaphy with mesh    LIGAMENT REPAIR Left 07/2013   shoulder   LITHOTRIPSY     x2   NASAL SEPTUM  SURGERY  1975   POLYPECTOMY  04/26/2022   Procedure: POLYPECTOMY;  Surgeon: Thornton Park, MD;  Location: [facility];  Service: Gastroenterology;;   POSTERIOR CERVICAL FUSION/FORAMINOTOMY N/A 11/24/2020   Procedure: Posterior Cervical Laminectomy Cervical three-four, Cervical four-five with lateral mass fusion;  Surgeon: [name], MD;  Location: [place];  Service: Neurosurgery;  Laterality: N/A;   REFRACTIVE SURGERY Bilateral    Right total hip replacement  4/14   SKIN CANCER DESTRUCTION     nose, ear   TONSILLECTOMY  as child   TOTAL HIP ARTHROPLASTY Left    URETHRAL DILATION  1991   [name]   Patient Active Problem List   Diagnosis Date Noted   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   Vitamin D deficiency 08/07/2022   Closed fracture of left hip ([place]) 08/07/2022   Hand weakness 05/02/2022   Acute kidney injury ([place]) 05/02/2022   Adenomatous polyp of ascending colon    Adenomatous polyp of colon    Acute metabolic encephalopathy 62/22/9798   GI bleed 04/21/2022   Vasovagal syncope     Hemorrhagic shock (HCC)    Symptomatic anemia    Hematochezia    Acute blood loss anemia    Irregular heart beat 02/03/2022   Constipation 02/03/2022   Carpal tunnel syndrome 01/24/2022   Numbness of hand 01/24/2022   Ulnar neuropathy 01/24/2022   Skin lesion 01/16/2022   History of COVID-19 12/05/2021   Benign paroxysmal positional vertigo 09/14/2021   Palpitations 07/25/2021   Cardiac murmur 07/25/2021   History of chicken pox 07/22/2021   History of hemorrhoids 07/22/2021   Hypertension 07/22/2021   COPD (chronic obstructive pulmonary disease) ([place]) 07/22/2021   History of kidney stones 07/22/2021   History of skin cancer 92/07/9416   Complication of anesthesia 07/22/2021   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[place]) 07/22/2021   Other abnormal glucose 07/22/2021   Spondylitis ([place]) 07/22/2021   Greater trochanteric pain syndrome 07/22/2021   Vertigo 07/08/2021   Hypokalemia 07/08/2021   Trochanteric bursitis of left hip 06/30/2021   Cancer ([place]) 04/11/2021   Lumbar radiculopathy 03/02/2021   Gait instability 03/02/2021   Hip pain 03/02/2021   Situational depression 03/02/2021   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[place]) 02/09/2021   Urinary retention 11/26/2020   Cervical myelopathy ([place]) 11/24/2020   Body mass index (BMI) 25.0-25.9, adult 11/02/2020   Lumbar spondylosis 11/02/2020   Lumbago of lumbar region with sciatica 10/21/2020   Cystoid macular edema of both eyes 07/28/2020   Status post cervical spinal fusion 07/09/2020   History of total bilateral knee replacement 07/08/2020   Aortic atherosclerosis ([place]) 03/05/2020   Emphysema, unspecified ([place]) 03/05/2020   Arthritis 03/04/2020   Diverticulosis 03/03/2020   Internal hemorrhoids 03/03/2020   Weight loss 03/03/2020   Heme positive stool 02/29/2020   Leg cramps 11/13/2019   Tibialis anterior tendon tear, nontraumatic 11/13/2019   Type 2 macular telangiectasis  of both eyes 07/29/2018   CPAP (continuous positive airway pressure) dependence 03/25/2018   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   Erectile dysfunction 06/20/2017   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 03/06/2017   Peripheral neuropathy 06/19/2016   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   Hearing loss 02/02/2016   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   Allergic rhinitis 02/02/2016   Atypical chest pain 11/24/2015   Preventative health care 11/24/2015   Onychomycosis 06/30/2015   Degenerative disc disease, lumbar 06/30/2015   Other allergic rhinitis 02/18/2015  Deviated nasal septum 02/18/2015   RLS (restless legs syndrome) 02/18/2015   GERD (gastroesophageal reflux disease) 09/18/2014   Hyperglycemia 03/03/2014   Rotator cuff tear 07/27/2013   Sleep apnea with use of continuous positive airway pressure (CPAP) 07/15/2013   Carotid stenosis 02/24/2013   Nonspecific abnormal electrocardiogram (ECG) (EKG) 01/06/2013   DJD (degenerative joint disease) of hip 12/24/2012   Lower GI bleed 12/12/2012   Right groin pain 11/29/2012   OSA (obstructive sleep apnea) 11/25/2012   Ventral hernia 07/08/2012   High risk medication use 01/18/2012   Dyspnea 06/12/2011   Hyperlipidemia with target LDL less than 130 12/27/2010   Osteoarthrosis, unspecified whether generalized or localized, pelvic region and thigh 07/25/2010   Osteoarthrosis, hand 06/13/2010   Anemia 12/17/2009   Hypothyroidism 05/13/2009   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   Benign essential hypertension 05/13/2009   Right inguinal hernia 05/13/2009   HIATAL HERNIA 05/13/2009   NEPHROLITHIASIS 05/13/2009   ROSACEA 05/13/2009    PCP: [name], NP   REFERRING PROVIDER: [name], NP   REFERRING DIAG: [phone] (ICD-10-CM) - S/P revision of total hip  PT eval and treat for aggressive left knee ROM, weight bearing as tolerated, balance/gait training. S/p revision of left hip  replacement at [facility]. (Dr. Linton Rump). No precautions.  THERAPY DIAG:  Stiffness of left knee, not elsewhere classified  Muscle weakness (generalized)  Other abnormalities of gait and mobility  Unsteadiness on feet  Repeated falls  Other low back pain  RATIONALE FOR EVALUATION AND TREATMENT: Rehabilitation  ONSET DATE: 07/02/22  NEXT MD VISIT: 09/18/22   SUBJECTIVE:                                                                                                                                                                                                         SUBJECTIVE STATEMENT: Pt arrives to PT with RW today, stating his R knee was too weak and painful to use the cane when he got up today.. Pain is now gone.  PAIN: Are you having pain? No  PERTINENT HISTORY:  Revision of femoral component of L THA on 07/02/22 s/p fall with periprosthetic fracture; Aortic atherosclerosis; carotid stenosis; cervical myelopathy s/p ACDF 06/2020 and posterior cervical fusion 11/2020; peripheral neuropathy; lumbar DDD/spondylosis; lumbago with sciatica/lumbar radiculopathy; OA; B TKA; BTHA; L greater trochanteric pain syndrome; L RCR 2014; recent CTR surgery - early May; hearing loss; HTN; BPPV/vertigo; B sacroiliitis; skin cancer; COPD/emphysema; sleep apnea with CPAP; GERD; gout; HLD; LE cramps; RLS   PRECAUTIONS: Fall  WEIGHT BEARING RESTRICTIONS: No - FWB on left as  of 08/18/22  FALLS:  Has patient fallen in last 6 months? Yes. 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 The proximal femur fracture looks healed as I cannot see the fracture line in any view. Good leg lengths.  Right hip replacement is cementless and looks well-fixed with a little varus of the tibial stem.  No bony lesion or arthritic change or other abnormality except some lumbar disc degeneration   07/21/2022 - ORTHO XR PELVIS 2 OR 3 VW UNILATERAL HIP: FINDINGS: AP and lateral views of left hip were obtained and interpreted.  Status post new left total hip revision with wire fixation appearing in good position. Good limb length. No new fracture, bony lesion, or other abnormal findings.  07/21/2022 - ORTHO XR FEMUR 2 VW UNILATERAL: FINDINGS: 3 views of the left femur were obtained and interpreted. Status post new left total hip revision with wire fixation in good position. Previous left total knee arthroplasty. No new fracture, bony lesion, or other abnormal findings.   07/02/2022 - DG HIP (WITH OR WITHOUT PELVIS) 2-3V LEFT :  FINDINGS: Three fluoroscopic spot views of the hip obtained in the operating room. Cerclage wire fixation of periprosthetic fracture, in improved alignment. Fluoroscopy time 12 seconds. Dose 1.23 mGy. IMPRESSION: Fluoroscopic spot views of the hip during periprosthetic fracture fixation.   07/01/2022 - XR THIGH/FEMUR LEFT 2V & XR PELVIS LIMITED 1 OR 2 VIEWS AP: FINDINGS: Degenerative changes in the lower lumbar spine. Pelvis is intact. SI joints and symphysis pubis are not displaced. Right total hip arthroplasty with non cemented  components. Tip of the arthroplasty is not included within the field of view. Visualized components appear well seated. Left total hip arthroplasty. The left hip demonstrates an acute periprosthetic fracture of the proximal femur with lateral displacement and mild distraction of the distal fracture fragment. No evidence of dislocation of the prosthesis. The midshaft and distal left femur are intact. Postoperative left knee arthroplasty. IMPRESSION: 1. Left hip arthroplasty with periprosthetic fracture of the proximal femur. 2. Incidental note of right hip and left knee arthroplasties.   PATIENT SURVEYS:  LEFS 19 / 80 = 23.8 %  COGNITION: Overall cognitive status: Within functional limits for tasks assessed    SENSATION: Peripheral neuropathy in B feet    EDEMA:  Mild to moderate edema in distal B LE, L>R  MUSCLE LENGTH: (09/07/22) Hamstrings: mod tight R>L ITB: mod tight B Piriformis: mod/severe tight B Hip flexors:  Quads:  Heelcord:   POSTURE:  rounded shoulders, forward head, flexed trunk , and weight shift right  PALPATION: Increased muscle tension  throughout left quads and IT band as well as hamstrings  LOWER EXTREMITY ROM:  Active ROM Right eval Left eval  Hip flexion    Hip extension    Hip abduction    Hip adduction    Hip internal rotation    Hip external rotation    Knee flexion 115 92  Knee extension 0 0  Ankle dorsiflexion  limited  Ankle plantarflexion    Ankle inversion    Ankle eversion     Passive ROM Left eval  Knee flexion 95  (Blank rows = not tested)  LOWER EXTREMITY MMT:  MMT Right Eval* Left Eval*  Hip flexion 3+ 3+  Hip extension 4- 4-  Hip abduction 4 4-  Hip adduction 4 4  Hip internal rotation    Hip external rotation    Knee flexion 4 4  Knee extension 4+ 4+  Ankle dorsiflexion 4+ 4  Ankle plantarflexion    Ankle inversion    Ankle eversion     (Blank rows = not tested, * - tested in sitting)  FUNCTIONAL TESTS:  5 times  sit to stand: 14.22 sec (09/07/22) Timed up and go (TUG): 21.28 sec with SPC 10 meter walk test: 19.94 sec with SPC; Gait speed = 1.63 ft/sec Berg Balance Scale: 38/56; 37-45 significant risk for falls (>80%)  (09/07/22) Dynamic Gait Index: 10/24; Scores of 19 or less are predicitve of falls in older community living adults (09/07/22)  PLOF (As of 06/20/22): ABC scale: 730 / 1600 = 45.6% Berg: 48/56; 46-51 moderate fall risk (>50%)  10MWT = 11.50 sec with SPC; 10.06 sec w/o AD Gait speed: 2.85 ft/sec with SPC; 3.26 ft/sec w/o AD DGI: 18/24; Scores of 19 or less are predictive of falls in older community living adults  GAIT: Distance walked: 60 ft Assistive device utilized: Single point cane Level of assistance: SBA Gait pattern: step to pattern, step through pattern, decreased step length- Right, decreased stance time- Left, decreased hip/knee flexion- Right, and decreased hip/knee flexion- Left Comments: Inconsistent step length on right creating a barrier to versus step through pattern.  Notable gait instability with SPC.   TODAY'S TREATMENT:   09/07/22 THERAPEUTIC ACTIVITIES: 5xSTS = 14.22 sec Berg = 38/56; 37-45 significant risk for falls (>80%)  DGI = 10/24; Scores of 19 or less are predicitve of falls in older community living adults.  THERAPEUTIC EXERCISE: to improve flexibility, strength and mobility.  Verbal and tactile cues throughout for technique.  Hooklying HS stretch with strap 2 x 30" bil Supine cross-body ITB stretch with strap 2 x 30" bil Hooklying heel slide with strap assist for increased knee flexion 10 x 5" Gentle L SKTC stretch + L knee flexion over hands on back of thigh x 10   09/05/22 Eval only   PATIENT EDUCATION:  Education details: PT eval findings, initial HEP, and gait safety with RW   Person educated: Patient Education method: Explanation, Demonstration, Verbal cues, and Handouts Education comprehension: verbalized understanding, returned  demonstration, verbal cues required, and needs further education  HOME EXERCISE PROGRAM: Access Code: [email or web address] URL: [email or web address] Date: 09/07/2022 Prepared by: Annie Paras  Exercises - Hooklying Hamstring Stretch with Strap  - 2 x daily - 7 x weekly - 3 reps - 30 sec hold - Supine Iliotibial Band Stretch with Strap  - 2 x daily - 7 x weekly - 3 reps - 30 sec hold - Supine Heel Slide with Strap  - 2 x daily - 7 x weekly - 2  sets - 10 reps - 3 sec hold - Hooklying Single Knee to Chest Stretch  - 2 x daily - 7 x weekly - 3 reps - 30 sec hold   ASSESSMENT:  CLINICAL IMPRESSION: Balance testing completed with significant decrease in scores noted as compared to prior to discharge from PT at the time of his hip fracture.  Discussed findings of balance test and indication for use of rolling walker as opposed to cane until balance improves further to reduce fall risk.  Initial HEP created to address left knee ROM and restrictions in proximal flexibility - will plan to review and expand upon exercises next visit.  OBJECTIVE IMPAIRMENTS: Abnormal gait, decreased activity tolerance, decreased balance, decreased coordination, decreased endurance, decreased knowledge of condition, decreased knowledge of use of DME, decreased mobility, difficulty walking, decreased ROM, decreased strength, decreased safety awareness, increased fascial restrictions, impaired perceived functional ability, impaired flexibility, impaired sensation, improper body mechanics, postural dysfunction, and pain.   ACTIVITY LIMITATIONS: carrying, lifting, bending, sitting, standing, squatting, sleeping, stairs, transfers, bed mobility, bathing, dressing, locomotion level, and caring for others  PARTICIPATION LIMITATIONS: meal prep, cleaning, laundry, driving, shopping, community activity, and yard work  PERSONAL FACTORS: Age, Fitness, Past/current experiences, Time since onset of injury/illness/exacerbation, and  3+ comorbidities: Aortic atherosclerosis; carotid stenosis; cervical myelopathy s/p ACDF 06/2020 and posterior cervical fusion 11/2020; peripheral neuropathy; lumbar DDD/spondylosis; lumbago with sciatica/lumbar radiculopathy; OA; B TKA; BTHA; L greater trochanteric pain syndrome; L RCR 2014; 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   REHAB POTENTIAL: Good  CLINICAL DECISION MAKING: Evolving/moderate complexity  EVALUATION COMPLEXITY: Moderate   GOALS: Goals reviewed with patient? Yes  SHORT TERM GOALS: Target date: 10/03/2022   Patient will be independent with initial HEP. Baseline:  Goal status: IN PROGRESS  2.  Complete balance testing and establish additional LTGs as appropriate. Baseline:  Goal status: MET 09/07/22  3.  Patient will demonstrate decreased TUG time to </= 16 sec to decrease risk for falls with transitional mobility Baseline: 21.28 sec Goal status: IN PROGRESS  LONG TERM GOALS: Target date: 10/31/2022   Patient will be independent with advanced/ongoing HEP to improve outcomes and carryover.  Baseline:  Goal status: IN PROGRESS  2.  Patient will report at least 50-75% improvement in low back and Lhip pain to improve QOL. Baseline: 7/10 Goal status: IN PROGRESS  3.  Patient will demonstrate improved L knee AROM to [facility] to allow for normal gait and stair mechanics. Baseline: AROM 0-92, PROM 0-95 Goal status: IN PROGRESS  4.  Patient will demonstrate improved B LE strength to >/= 4 to 4+/5 for improved stability and ease of mobility. Baseline:  Goal status: IN PROGRESS  5.  Patient will be able to ambulate 600' with LRAD and normal gait pattern, good stability and without increased pain to safely access community.  Baseline:  Goal status: IN PROGRESS  6. Patient will be able to  ascend/descend stairs with 1 HR and reciprocal step pattern safely to access home and community.  Baseline:  Goal status: IN PROGRESS  7.  Patient will report >/=28/80 on LEFS (patient reported outcome measure) to demonstrate improved functional ability. Baseline: 19 / 80 = 23.8 % Goal status: IN PROGRESS  8.  Patient will demonstrate at least 19/24 on DGI to decrease risk of falls. Baseline: 10/24 (09/07/22) Goal status: IN PROGRESS  9.  Patient will improve Berg score by at least 8 points to improve safety and stability with ADLs in standing and reduce risk for falls Baseline: 38/56 (09/07/22) Goal status: IN PROGRESS   PLAN:  PT FREQUENCY: 2x/week  PT DURATION: 8 weeks  PLANNED INTERVENTIONS: Therapeutic exercises, Therapeutic activity, Neuromuscular re-education, Balance training, Gait training, Patient/Family education, Self Care, Joint mobilization, Stair training, DME instructions, Dry Needling, Electrical stimulation, Cryotherapy, Moist heat, scar mobilization, Taping, Ultrasound, Ionotophoresis 78m/ml Dexamethasone, Manual therapy, and Re-evaluation  PLAN FOR NEXT SESSION: gait with rolling walker, weaning SPC as appropriate; review and update initial HEP - L knee ROM, LE flexibility and strengthening   JPercival Spanish PT 09/07/2022, 3:28 PM

## 2022-09-08 ENCOUNTER — Encounter: Payer: Medicare HMO | Admitting: Physical Therapy

## 2022-09-13 ENCOUNTER — Ambulatory Visit: Payer: Medicare HMO | Admitting: Family

## 2022-09-13 ENCOUNTER — Encounter: Payer: Self-pay | Admitting: Physical Therapy

## 2022-09-13 ENCOUNTER — Ambulatory Visit: Payer: Medicare HMO | Attending: Family | Admitting: Physical Therapy

## 2022-09-13 DIAGNOSIS — R2681 Unsteadiness on feet: Secondary | ICD-10-CM | POA: Insufficient documentation

## 2022-09-13 DIAGNOSIS — M6281 Muscle weakness (generalized): Secondary | ICD-10-CM | POA: Diagnosis not present

## 2022-09-13 DIAGNOSIS — R2689 Other abnormalities of gait and mobility: Secondary | ICD-10-CM | POA: Insufficient documentation

## 2022-09-13 DIAGNOSIS — R296 Repeated falls: Secondary | ICD-10-CM | POA: Diagnosis not present

## 2022-09-13 DIAGNOSIS — M25662 Stiffness of left knee, not elsewhere classified: Secondary | ICD-10-CM | POA: Diagnosis not present

## 2022-09-13 DIAGNOSIS — M5459 Other low back pain: Secondary | ICD-10-CM | POA: Insufficient documentation

## 2022-09-13 NOTE — Therapy (Signed)
OUTPATIENT PHYSICAL THERAPY TREATMENT   Patient Name: [name] Pratt MRN: [id number] DOB:25-Oct-1940, 82 y.o., male Today's Date: 09/13/2022   END OF SESSION:  PT End of Session - 09/13/22 1103     Visit Number 3    Date for PT Re-Evaluation 10/31/22    Authorization Type [name]    PT Start Time 1103    PT Stop Time 1145    PT Time Calculation (min) 42 min    Activity Tolerance Patient tolerated treatment well    Behavior During Therapy [facility] for tasks assessed/performed              Past Medical History:  Diagnosis Date   Allergic rhinitis 02/02/2016   Anemia 12/17/2009   Formatting of this note might be different from the original. Anemia  10/1 IMO update   Aortic atherosclerosis ([place]) 03/05/2020   Arthritis    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   Atypical chest pain 11/24/2015   Benign essential hypertension 05/13/2009   Qualifier: Diagnosis of  By: Larwance Sachs of this note might be different from the original. Hypertension   Benign paroxysmal positional vertigo 09/14/2021   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[place]) 07/22/2021   Body mass index (BMI) 25.0-25.9, adult 11/02/2020   Cancer ([place])    skin cancer   Cardiac murmur 07/25/2021   Carotid stenosis    a. Carotid U/S 4/23: LICA < 95%, RICA 32-02%;  b.  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   Cystoid macular edema of both eyes 07/28/2020   Degenerative disc disease, lumbar 06/30/2015   Deviated nasal septum 02/18/2015   Diverticulosis 03/03/2020   DJD (degenerative joint disease) of hip 12/24/2012   Dyspnea 06/12/2011    Emphysema, unspecified ([place]) 03/05/2020   Erectile dysfunction 06/20/2017   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[place]) 02/09/2021   Gait disorder 03/02/2021   GERD (gastroesophageal reflux disease)    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   Qualifier: Diagnosis of  By: Burnett Kanaris     Greater trochanteric pain syndrome 07/22/2021   Hand pain, left 01/16/2022   Hearing loss 02/02/2016   Heme positive stool 02/29/2020   HIATAL HERNIA 05/13/2009   Qualifier: Diagnosis of  By: Burnett Kanaris     High risk medication use 01/18/2012   Hip pain 03/02/2021   History of chicken pox    History of COVID-19 12/05/2021   History of hemorrhoids    History of kidney stones    History of skin cancer 07/22/2021   skin cancer   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 Cardiology - Dr Frances Nickels at [facility] cardiology  ICD-10 cut over   Hypertension    under control   Hypokalemia 07/08/2021   Hypothyroidism    Internal hemorrhoids 03/03/2020   Leg cramps 11/13/2019   Lower GI bleed 12/12/2012   Lumbago of lumbar region with sciatica 10/21/2020   Lumbar radiculopathy 03/02/2021   Lumbar spondylosis 11/02/2020   NEPHROLITHIASIS  05/13/2009   Qualifier: Diagnosis of  By: Burnett Kanaris     Nonspecific abnormal electrocardiogram (ECG) (EKG) 01/06/2013   Numbness of hand 01/24/2022   Onychomycosis 06/30/2015   Osteoarthrosis, 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 Osteoarthritis Of The Hand  10/1 IMO update   Osteoarthrosis, unspecified whether generalized or localized, pelvic region and thigh 07/25/2010   Formatting of this note might be different from the original. Osteoarthritis Of The Hip   Other abnormal glucose 07/22/2021   Other allergic  rhinitis 02/18/2015   Palpitations 07/25/2021   Peripheral neuropathy 06/19/2016   Preventative health care 11/24/2015   Right groin pain 11/29/2012   Right inguinal hernia 05/13/2009   Qualifier: Diagnosis of  By: Burnett Kanaris     RLS (restless legs syndrome) 02/18/2015   Rosacea    Rotator cuff tear 07/27/2013   Situational depression 03/02/2021   Skin lesion 01/16/2022   Sleep apnea with use of continuous positive airway pressure (CPAP) 07/15/2013   CPAP set to  7 cm water,  Residual AHi was 7.8  With no leak.  User time 6 hours.  479 days , not used only 12 days - highly compliant 06-23-13  .    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   Urinary retention 11/26/2020   Ventral hernia 07/08/2012   Vertigo 07/08/2021   Weight loss 03/03/2020   Past Surgical History:  Procedure Laterality Date   ABDOMINAL HERNIA REPAIR  07/15/12   Dr [name]   ANTERIOR CERVICAL DECOMP/DISCECTOMY FUSION N/A 07/09/2020   Procedure: ANTERIOR CERVICAL DECOMPRESSION AND FUSION CERVICAL THREE-FOUR.;  Surgeon: [name], MD;  Location: [facility];  Service: Neurosurgery;  Laterality: N/A;  anterior   BROW LIFT  05/07/01   COLONOSCOPY WITH PROPOFOL N/A 04/24/2022   Procedure: COLONOSCOPY WITH PROPOFOL;  Surgeon: Thornton Park, MD;  Location: [facility];  Service: Gastroenterology;  Laterality: N/A;   COLONOSCOPY WITH PROPOFOL N/A 04/26/2022   Procedure: COLONOSCOPY WITH PROPOFOL;  Surgeon: Thornton Park, MD;  Location: [facility];  Service: Gastroenterology;  Laterality: N/A;   CYSTOSCOPY WITH URETEROSCOPY AND STENT PLACEMENT Right 01/28/2014   Procedure: CYSTOSCOPY WITH  URETEROSCOPY, BASKET RETRIVAL AND  STENT PLACEMENT;  Surgeon: [name], MD;  Location: [facility];  Service: Urology;  Laterality: Right;   epidural injections     multiple procedures   ESOPHAGEAL DILATION  1993 and 1994   multiple times   EYE SURGERY Bilateral 03/25/10, 2012   cataract removal   HEMORRHOID SURGERY  2014   HIATAL HERNIA REPAIR  12/21/93   HOLMIUM LASER APPLICATION Right 11/24/4006   Procedure: HOLMIUM LASER APPLICATION;  Surgeon: [name], MD;  Location: [facility];  Service: Urology;  Laterality: Right;   INGUINAL HERNIA REPAIR Right 07/15/12   Dr [name], x2   JOINT REPLACEMENT  07/25/04   right knee   JOINT REPLACEMENT  07/13/10   left knee   [place]  09/20/06   with intraoperative cholangiogram and right inguinal herniorrhaphy with mesh    LIGAMENT REPAIR Left 07/2013   shoulder   LITHOTRIPSY     x2  NASAL SEPTUM SURGERY  1975   POLYPECTOMY  04/26/2022   Procedure: POLYPECTOMY;  Surgeon: Thornton Park, MD;  Location: [facility];  Service: Gastroenterology;;   POSTERIOR CERVICAL FUSION/FORAMINOTOMY N/A 11/24/2020   Procedure: Posterior Cervical Laminectomy Cervical three-four, Cervical four-five with lateral mass fusion;  Surgeon: [name], MD;  Location: [place];  Service: Neurosurgery;  Laterality: N/A;   REFRACTIVE SURGERY Bilateral    Right total hip replacement  4/14   SKIN CANCER DESTRUCTION     nose, ear   TONSILLECTOMY  as child   TOTAL HIP ARTHROPLASTY Left    URETHRAL DILATION  1991   [name]   Patient Active Problem List   Diagnosis Date Noted   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   Vitamin D deficiency 08/07/2022   Closed fracture of left hip ([place]) 08/07/2022   Hand weakness 05/02/2022   Acute kidney injury ([place]) 05/02/2022   Adenomatous polyp of ascending colon    Adenomatous polyp of colon    Acute metabolic encephalopathy 32/20/2542   GI bleed 04/21/2022   Vasovagal syncope     Hemorrhagic shock (HCC)    Symptomatic anemia    Hematochezia    Acute blood loss anemia    Irregular heart beat 02/03/2022   Constipation 02/03/2022   Carpal tunnel syndrome 01/24/2022   Numbness of hand 01/24/2022   Ulnar neuropathy 01/24/2022   Skin lesion 01/16/2022   History of COVID-19 12/05/2021   Benign paroxysmal positional vertigo 09/14/2021   Palpitations 07/25/2021   Cardiac murmur 07/25/2021   History of chicken pox 07/22/2021   History of hemorrhoids 07/22/2021   Hypertension 07/22/2021   COPD (chronic obstructive pulmonary disease) ([place]) 07/22/2021   History of kidney stones 07/22/2021   History of skin cancer 70/62/3762   Complication of anesthesia 07/22/2021   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[place]) 07/22/2021   Other abnormal glucose 07/22/2021   Spondylitis ([place]) 07/22/2021   Greater trochanteric pain syndrome 07/22/2021   Vertigo 07/08/2021   Hypokalemia 07/08/2021   Trochanteric bursitis of left hip 06/30/2021   Cancer ([place]) 04/11/2021   Lumbar radiculopathy 03/02/2021   Gait instability 03/02/2021   Hip pain 03/02/2021   Situational depression 03/02/2021   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[place]) 02/09/2021   Urinary retention 11/26/2020   Cervical myelopathy ([place]) 11/24/2020   Body mass index (BMI) 25.0-25.9, adult 11/02/2020   Lumbar spondylosis 11/02/2020   Lumbago of lumbar region with sciatica 10/21/2020   Cystoid macular edema of both eyes 07/28/2020   Status post cervical spinal fusion 07/09/2020   History of total bilateral knee replacement 07/08/2020   Aortic atherosclerosis ([place]) 03/05/2020   Emphysema, unspecified ([place]) 03/05/2020   Arthritis 03/04/2020   Diverticulosis 03/03/2020   Internal hemorrhoids 03/03/2020   Weight loss 03/03/2020   Heme positive stool 02/29/2020   Leg cramps 11/13/2019   Tibialis anterior tendon tear, nontraumatic 11/13/2019   Type 2 macular telangiectasis  of both eyes 07/29/2018   CPAP (continuous positive airway pressure) dependence 03/25/2018   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   Erectile dysfunction 06/20/2017   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 03/06/2017   Peripheral neuropathy 06/19/2016   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   Hearing loss 02/02/2016   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   Allergic rhinitis 02/02/2016   Atypical chest pain 11/24/2015   Preventative health care 11/24/2015   Onychomycosis 06/30/2015   Degenerative disc disease, lumbar 06/30/2015   Other allergic rhinitis  02/18/2015   Deviated nasal septum 02/18/2015   RLS (restless legs syndrome) 02/18/2015   GERD (gastroesophageal reflux disease) 09/18/2014   Hyperglycemia 03/03/2014   Rotator cuff tear 07/27/2013   Sleep apnea with use of continuous positive airway pressure (CPAP) 07/15/2013   Carotid stenosis 02/24/2013   Nonspecific abnormal electrocardiogram (ECG) (EKG) 01/06/2013   DJD (degenerative joint disease) of hip 12/24/2012   Lower GI bleed 12/12/2012   Right groin pain 11/29/2012   OSA (obstructive sleep apnea) 11/25/2012   Ventral hernia 07/08/2012   High risk medication use 01/18/2012   Dyspnea 06/12/2011   Hyperlipidemia with target LDL less than 130 12/27/2010   Osteoarthrosis, unspecified whether generalized or localized, pelvic region and thigh 07/25/2010   Osteoarthrosis, hand 06/13/2010   Anemia 12/17/2009   Hypothyroidism 05/13/2009   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   Benign essential hypertension 05/13/2009   Right inguinal hernia 05/13/2009   HIATAL HERNIA 05/13/2009   NEPHROLITHIASIS 05/13/2009   ROSACEA 05/13/2009    PCP: [name], NP   REFERRING PROVIDER: [name], NP   REFERRING DIAG: [id number] (ICD-10-CM) - S/P revision of total hip  PT eval and treat for aggressive left knee ROM, weight bearing as tolerated, balance/gait training. S/p revision of left hip  replacement at [facility]. (Dr. Linton Rump). No precautions.  THERAPY DIAG:  Other abnormalities of gait and mobility  Muscle weakness (generalized)  Unsteadiness on feet  Repeated falls  Stiffness of left knee, not elsewhere classified  RATIONALE FOR EVALUATION AND TREATMENT: Rehabilitation  ONSET DATE: 07/02/22  NEXT MD VISIT: 09/18/22   SUBJECTIVE:                                                                                                                                                                                                         SUBJECTIVE STATEMENT: Pt reports feeling weakness in the back of the knee. Has been feeling discomfort in the back of his knee and calves. No pain.   PAIN: Are you having pain? No  PERTINENT HISTORY:  Revision of femoral component of L THA on 07/02/22 s/p fall with periprosthetic fracture; Aortic atherosclerosis; carotid stenosis; cervical myelopathy s/p ACDF 06/2020 and posterior cervical fusion 11/2020; peripheral neuropathy; lumbar DDD/spondylosis; lumbago with sciatica/lumbar radiculopathy; OA; B TKA; BTHA; L greater trochanteric pain syndrome; L RCR 2014; recent CTR surgery - early May; hearing loss; HTN; BPPV/vertigo; B sacroiliitis; skin cancer; COPD/emphysema; sleep apnea with CPAP; GERD; gout; HLD; LE cramps; RLS   PRECAUTIONS: Fall  WEIGHT BEARING RESTRICTIONS: No - FWB on left as of 08/18/22  FALLS:  Has  patient fallen in last 6 months? Yes. 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 The proximal femur fracture looks healed as I cannot see the fracture line in any view. Good leg lengths.  Right hip replacement is cementless and looks well-fixed with a little varus of the tibial stem.  No bony lesion or arthritic change or other abnormality except some lumbar disc degeneration   07/21/2022 - ORTHO XR PELVIS 2 OR 3 VW UNILATERAL HIP: FINDINGS: AP and lateral views of left hip were obtained and interpreted.  Status post new left total hip revision with wire fixation appearing in good position. Good limb length. No new fracture, bony lesion, or other abnormal findings.  07/21/2022 - ORTHO XR FEMUR 2 VW UNILATERAL: FINDINGS: 3 views of the left femur were obtained and interpreted. Status post new left total hip revision with wire fixation in good position. Previous left total knee arthroplasty. No new fracture, bony lesion, or other abnormal findings.   07/02/2022 - DG HIP (WITH OR WITHOUT PELVIS) 2-3V LEFT :  FINDINGS: Three fluoroscopic spot views of the hip obtained in the operating room. Cerclage wire fixation of periprosthetic fracture, in improved alignment. Fluoroscopy time 12 seconds. Dose 1.23 mGy. IMPRESSION: Fluoroscopic spot views of the hip during periprosthetic fracture fixation.   07/01/2022 - XR THIGH/FEMUR LEFT 2V & XR PELVIS LIMITED 1 OR 2 VIEWS AP: FINDINGS: Degenerative changes in the lower lumbar spine. Pelvis is intact. SI joints and symphysis pubis are not displaced. Right total hip arthroplasty with non cemented components. Tip of the arthroplasty is not  included within the field of view. Visualized components appear well seated. Left total hip arthroplasty. The left hip demonstrates an acute periprosthetic fracture of the proximal femur with lateral displacement and mild distraction of the distal fracture fragment. No evidence of dislocation of the prosthesis. The midshaft and distal left femur are intact. Postoperative left knee arthroplasty. IMPRESSION: 1. Left hip arthroplasty with periprosthetic fracture of the proximal femur. 2. Incidental note of right hip and left knee arthroplasties.   PATIENT SURVEYS:  LEFS 19 / 80 = 23.8 %  COGNITION: Overall cognitive status: Within functional limits for tasks assessed    SENSATION: Peripheral neuropathy in B feet    EDEMA:  Mild to moderate edema in distal B LE, L>R  MUSCLE LENGTH: (09/07/22) Hamstrings: mod tight R>L ITB: mod tight B Piriformis: mod/severe tight B Hip flexors:  Quads:  Heelcord:   POSTURE:  rounded shoulders, forward head, flexed trunk , and weight shift right  PALPATION: Increased muscle tension throughout left quads and IT band  as well as hamstrings  LOWER EXTREMITY ROM:  Active ROM Right eval Left eval  Hip flexion    Hip extension    Hip abduction    Hip adduction    Hip internal rotation    Hip external rotation    Knee flexion 115 92  Knee extension 0 0  Ankle dorsiflexion  limited  Ankle plantarflexion    Ankle inversion    Ankle eversion     Passive ROM Left eval  Knee flexion 95  (Blank rows = not tested)  LOWER EXTREMITY MMT:  MMT Right Eval* Left Eval*  Hip flexion 3+ 3+  Hip extension 4- 4-  Hip abduction 4 4-  Hip adduction 4 4  Hip internal rotation    Hip external rotation    Knee flexion 4 4  Knee extension 4+ 4+  Ankle dorsiflexion 4+ 4  Ankle plantarflexion    Ankle inversion    Ankle eversion     (Blank rows = not tested, * - tested in sitting)  FUNCTIONAL TESTS:  5 times sit to stand: 14.22 sec (09/07/22) Timed  up and go (TUG): 21.28 sec with SPC 10 meter walk test: 19.94 sec with SPC; Gait speed = 1.63 ft/sec Berg Balance Scale: 38/56; 37-45 significant risk for falls (>80%)  (09/07/22) Dynamic Gait Index: 10/24; Scores of 19 or less are predicitve of falls in older community living adults (09/07/22)  PLOF (As of 06/20/22): ABC scale: 730 / 1600 = 45.6% Berg: 48/56; 46-51 moderate fall risk (>50%)  10MWT = 11.50 sec with SPC; 10.06 sec w/o AD Gait speed: 2.85 ft/sec with SPC; 3.26 ft/sec w/o AD DGI: 18/24; Scores of 19 or less are predictive of falls in older community living adults  GAIT: Distance walked: 60 ft Assistive device utilized: Single point cane Level of assistance: SBA Gait pattern: step to pattern, step through pattern, decreased step length- Right, decreased stance time- Left, decreased hip/knee flexion- Right, and decreased hip/knee flexion- Left Comments: Inconsistent step length on right creating a barrier to versus step through pattern.  Notable gait instability with SPC.   TODAY'S TREATMENT:  09/13/22 THERAPEUTIC EXERCISES Sitting  Hamstring stretch x1 min Heel slide x 10 Gastroc stretch with strap 2x30 sec Hip flexor + quad stretch off EOB 2x30 sec Knee flexion red TB 2x10 Knee ext red TB 2x10 Standing  Side stepping with red TB 2x10 counter support  Gastroc stretch at counter x30 sec  NEUROMUSCULAR RE-ED Marching 2x10 counter support (uncomfortable with 1 UE support, decreased weight shift to L) Backwards walking counter support 3x10' Static standing feet together solid surface  EO 2x30 sec  EC 2x30 sec (1 lob forward) Heel/toe raise 2x10   09/07/22 THERAPEUTIC ACTIVITIES: 5xSTS = 14.22 sec Berg = 38/56; 37-45 significant risk for falls (>80%)  DGI = 10/24; Scores of 19 or less are predicitve of falls in older community living adults.  THERAPEUTIC EXERCISE: to improve flexibility, strength and mobility.  Verbal and tactile cues throughout for technique.   Hooklying HS stretch with strap 2 x 30" bil Supine cross-body ITB stretch with strap 2 x 30" bil Hooklying heel slide with strap assist for increased knee flexion 10 x 5" Gentle L SKTC stretch + L knee flexion over hands on back of thigh x 10   09/05/22 Eval only   PATIENT EDUCATION:  Education details: PT eval findings, initial HEP, and gait safety with RW   Person educated: Patient Education method: Explanation, Demonstration, Verbal cues, and Handouts  Education comprehension: verbalized understanding, returned demonstration, verbal cues required, and needs further education  HOME EXERCISE PROGRAM: Access Code: [email or web address] URL: [email or web address] Date: 09/13/2022 Prepared by: Estill Bamberg April Thurnell Garbe  Exercises - Hooklying Hamstring Stretch with Strap  - 2 x daily - 7 x weekly - 3 reps - 30 sec hold - Supine Iliotibial Band Stretch with Strap  - 2 x daily - 7 x weekly - 3 reps - 30 sec hold - Supine Heel Slide with Strap  - 2 x daily - 7 x weekly - 2 sets - 10 reps - 3 sec hold - Hooklying Single Knee to Chest Stretch  - 2 x daily - 7 x weekly - 3 reps - 30 sec hold - Seated Hamstring Curls with Resistance  - 1 x daily - 7 x weekly - 2 sets - 10 reps - 3 sec hold - Seated Knee Extension with Resistance  - 1 x daily - 7 x weekly - 2 sets - 10 reps - Standing March with Counter Support  - 1 x daily - 7 x weekly - 2 sets - 10 reps   ASSESSMENT:  CLINICAL IMPRESSION: Session focused on continuing to improve L knee and hip ROM/flexibility. Worked on knee and hip strengthening. Initiated dynamic gait and balance at counter -- pt with difficulty weight shifting and maintaining weight bearing on L LE (required verbal and tactile cueing).   OBJECTIVE IMPAIRMENTS: Abnormal gait, decreased activity tolerance, decreased balance, decreased coordination, decreased endurance, decreased knowledge of condition, decreased knowledge of use of DME, decreased mobility, difficulty  walking, decreased ROM, decreased strength, decreased safety awareness, increased fascial restrictions, impaired perceived functional ability, impaired flexibility, impaired sensation, improper body mechanics, postural dysfunction, and pain.   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   REHAB POTENTIAL: Good  CLINICAL DECISION MAKING: Evolving/moderate complexity  EVALUATION COMPLEXITY: Moderate   GOALS: Goals reviewed with patient? Yes  SHORT TERM GOALS: Target date: 10/03/2022   Patient will be independent with initial HEP. Baseline:  Goal status: IN PROGRESS  2.  Complete balance testing and establish additional LTGs as appropriate. Baseline:  Goal status: MET 09/07/22  3.  Patient will demonstrate decreased TUG time to </= 16 sec to decrease risk for falls with transitional mobility Baseline: 21.28 sec Goal status: IN PROGRESS  LONG TERM GOALS: Target date: 10/31/2022   Patient will be independent with advanced/ongoing HEP to improve outcomes and carryover.  Baseline:  Goal status: IN PROGRESS  2.  Patient will report at  least 50-75% improvement in low back and Lhip pain to improve QOL. Baseline: 7/10 Goal status: IN PROGRESS  3.  Patient will demonstrate improved L knee AROM to [facility] to allow for normal gait and stair mechanics. Baseline: AROM 0-92, PROM 0-95 Goal status: IN PROGRESS  4.  Patient will demonstrate improved B LE strength to >/=  4 to 4+/5 for improved stability and ease of mobility. Baseline:  Goal status: IN PROGRESS  5.  Patient will be able to ambulate 600' with LRAD and normal gait pattern, good stability and without increased pain to safely access community.  Baseline:  Goal status: IN PROGRESS  6. Patient will be able to ascend/descend stairs with 1 HR and reciprocal step pattern safely to access home and community.  Baseline:  Goal status: IN PROGRESS  7.  Patient will report >/=28/80 on LEFS (patient reported outcome measure) to demonstrate improved functional ability. Baseline: 19 / 80 = 23.8 % Goal status: IN PROGRESS  8.  Patient will demonstrate at least 19/24 on DGI to decrease risk of falls. Baseline: 10/24 (09/07/22) Goal status: IN PROGRESS   9.  Patient will improve Berg score by at least 8 points to improve safety and stability with ADLs in standing and reduce risk for falls Baseline: 38/56 (09/07/22) Goal status: IN PROGRESS   PLAN:  PT FREQUENCY: 2x/week  PT DURATION: 8 weeks  PLANNED INTERVENTIONS: Therapeutic exercises, Therapeutic activity, Neuromuscular re-education, Balance training, Gait training, Patient/Family education, Self Care, Joint mobilization, Stair training, DME instructions, Dry Needling, Electrical stimulation, Cryotherapy, Moist heat, scar mobilization, Taping, Ultrasound, Ionotophoresis 12m/ml Dexamethasone, Manual therapy, and Re-evaluation  PLAN FOR NEXT SESSION: gait with rolling walker, weaning SPC as appropriate; review and update initial HEP - L knee ROM, LE flexibility and strengthening   Branch Pacitti April Ma L Cal Gindlesperger, PT 09/13/2022,  11:03 AM

## 2022-09-15 ENCOUNTER — Emergency Department (HOSPITAL_BASED_OUTPATIENT_CLINIC_OR_DEPARTMENT_OTHER): Payer: Medicare HMO

## 2022-09-15 ENCOUNTER — Encounter (HOSPITAL_BASED_OUTPATIENT_CLINIC_OR_DEPARTMENT_OTHER): Payer: Self-pay | Admitting: Emergency Medicine

## 2022-09-15 ENCOUNTER — Emergency Department (HOSPITAL_BASED_OUTPATIENT_CLINIC_OR_DEPARTMENT_OTHER)
Admission: EM | Admit: 2022-09-15 | Discharge: 2022-09-15 | Disposition: A | Discharge status: Home / Self Care | Payer: Medicare HMO | Attending: Emergency Medicine | Admitting: Emergency Medicine

## 2022-09-15 ENCOUNTER — Other Ambulatory Visit: Payer: Self-pay

## 2022-09-15 DIAGNOSIS — J449 Chronic obstructive pulmonary disease, unspecified: Secondary | ICD-10-CM | POA: Insufficient documentation

## 2022-09-15 DIAGNOSIS — I10 Essential (primary) hypertension: Secondary | ICD-10-CM | POA: Diagnosis not present

## 2022-09-15 DIAGNOSIS — M50322 Other cervical disc degeneration at C5-C6 level: Secondary | ICD-10-CM | POA: Diagnosis not present

## 2022-09-15 DIAGNOSIS — S5011XA Contusion of right forearm, initial encounter: Secondary | ICD-10-CM | POA: Diagnosis not present

## 2022-09-15 DIAGNOSIS — M7989 Other specified soft tissue disorders: Secondary | ICD-10-CM | POA: Diagnosis not present

## 2022-09-15 DIAGNOSIS — S59911A Unspecified injury of right forearm, initial encounter: Secondary | ICD-10-CM | POA: Diagnosis present

## 2022-09-15 DIAGNOSIS — Z85828 Personal history of other malignant neoplasm of skin: Secondary | ICD-10-CM | POA: Diagnosis not present

## 2022-09-15 DIAGNOSIS — Z23 Encounter for immunization: Secondary | ICD-10-CM | POA: Insufficient documentation

## 2022-09-15 DIAGNOSIS — S51812A Laceration without foreign body of left forearm, initial encounter: Secondary | ICD-10-CM

## 2022-09-15 DIAGNOSIS — Z8616 Personal history of COVID-19: Secondary | ICD-10-CM | POA: Insufficient documentation

## 2022-09-15 DIAGNOSIS — Z7982 Long term (current) use of aspirin: Secondary | ICD-10-CM | POA: Insufficient documentation

## 2022-09-15 DIAGNOSIS — S40021A Contusion of right upper arm, initial encounter: Secondary | ICD-10-CM | POA: Diagnosis not present

## 2022-09-15 DIAGNOSIS — I70201 Unspecified atherosclerosis of native arteries of extremities, right leg: Secondary | ICD-10-CM | POA: Diagnosis not present

## 2022-09-15 DIAGNOSIS — M50323 Other cervical disc degeneration at C6-C7 level: Secondary | ICD-10-CM | POA: Diagnosis not present

## 2022-09-15 DIAGNOSIS — M50321 Other cervical disc degeneration at C4-C5 level: Secondary | ICD-10-CM | POA: Diagnosis not present

## 2022-09-15 DIAGNOSIS — W19XXXA Unspecified fall, initial encounter: Secondary | ICD-10-CM | POA: Diagnosis not present

## 2022-09-15 DIAGNOSIS — S51011A Laceration without foreign body of right elbow, initial encounter: Secondary | ICD-10-CM

## 2022-09-15 DIAGNOSIS — S4991XA Unspecified injury of right shoulder and upper arm, initial encounter: Secondary | ICD-10-CM | POA: Diagnosis not present

## 2022-09-15 DIAGNOSIS — Z87891 Personal history of nicotine dependence: Secondary | ICD-10-CM | POA: Insufficient documentation

## 2022-09-15 DIAGNOSIS — Z79899 Other long term (current) drug therapy: Secondary | ICD-10-CM | POA: Insufficient documentation

## 2022-09-15 DIAGNOSIS — I6789 Other cerebrovascular disease: Secondary | ICD-10-CM | POA: Diagnosis not present

## 2022-09-15 DIAGNOSIS — Y92009 Unspecified place in unspecified non-institutional (private) residence as the place of occurrence of the external cause: Secondary | ICD-10-CM | POA: Insufficient documentation

## 2022-09-15 DIAGNOSIS — S51811A Laceration without foreign body of right forearm, initial encounter: Secondary | ICD-10-CM | POA: Diagnosis not present

## 2022-09-15 DIAGNOSIS — Z043 Encounter for examination and observation following other accident: Secondary | ICD-10-CM | POA: Diagnosis not present

## 2022-09-15 DIAGNOSIS — M7981 Nontraumatic hematoma of soft tissue: Secondary | ICD-10-CM | POA: Diagnosis not present

## 2022-09-15 DIAGNOSIS — I6381 Other cerebral infarction due to occlusion or stenosis of small artery: Secondary | ICD-10-CM | POA: Diagnosis not present

## 2022-09-15 DIAGNOSIS — R102 Pelvic and perineal pain: Secondary | ICD-10-CM | POA: Diagnosis not present

## 2022-09-15 DIAGNOSIS — S61512A Laceration without foreign body of left wrist, initial encounter: Secondary | ICD-10-CM | POA: Diagnosis not present

## 2022-09-15 LAB — BASIC METABOLIC PANEL
Anion gap: 9 (ref 5–15)
BUN: 28 mg/dL — ABNORMAL HIGH (ref 8–23)
CO2: 24 mmol/L (ref 22–32)
Calcium: 8.7 mg/dL — ABNORMAL LOW (ref 8.9–10.3)
Chloride: 105 mmol/L (ref 98–111)
Creatinine, Ser: 1.16 mg/dL (ref 0.61–1.24)
GFR, Estimated: >60 mL/min (ref >60)
Glucose, Bld: 98 mg/dL (ref 70–99)
Potassium: 3.2 mmol/L — ABNORMAL LOW (ref 3.5–5.1)
Sodium: 138 mmol/L (ref 135–145)

## 2022-09-15 LAB — CBC
HCT: 31.9 % — ABNORMAL LOW (ref 39.0–52.0)
Hemoglobin: 10.2 g/dL — ABNORMAL LOW (ref 13.0–17.0)
MCH: 30.7 pg (ref 26.0–34.0)
MCHC: 32 g/dL (ref 30.0–36.0)
MCV: 96.1 fL (ref 80.0–100.0)
Platelets: 323 10*3/uL (ref 150–400)
RBC: 3.32 MIL/uL — ABNORMAL LOW (ref 4.22–5.81)
RDW: 14.2 % (ref 11.5–15.5)
WBC: 10.6 10*3/uL — ABNORMAL HIGH (ref 4.0–10.5)
nRBC: 0 % (ref 0.0–0.2)

## 2022-09-15 MED ORDER — TETANUS-DIPHTH-ACELL PERTUSSIS 5-2.5-18.5 LF-MCG/0.5 IM SUSY
0.5000 mL | PREFILLED_SYRINGE | Freq: Once | INTRAMUSCULAR | Status: AC
  Administered 2022-09-15: 0.5 mL via INTRAMUSCULAR
  Filled 2022-09-15: qty 0.5

## 2022-09-15 MED ORDER — LIDOCAINE-EPINEPHRINE (PF) 2 %-1:200000 IJ SOLN
INTRAMUSCULAR | Status: AC
  Filled 2022-09-15: qty 20

## 2022-09-15 MED ORDER — BACITRACIN ZINC 500 UNIT/GM EX OINT: 1.0000 | TOPICAL_OINTMENT | Freq: Two times a day (BID) | CUTANEOUS | 0 refills | Status: DC

## 2022-09-15 MED ORDER — LIDOCAINE-EPINEPHRINE 2 %-1:100000 IJ SOLN
20.0000 mL | Freq: Once | INTRAMUSCULAR | Status: AC
  Administered 2022-09-15: 20 mL

## 2022-09-15 MED ORDER — IOHEXOL 350 MG/ML SOLN
100.0000 mL | Freq: Once | INTRAVENOUS | Status: AC | PRN
  Administered 2022-09-15: 100 mL via INTRAVENOUS

## 2022-09-15 NOTE — ED Provider Notes (Signed)
CTA of the arms unremarkable.  There is no arterial bleeding.  Patient overall patient with skin tear with fairly significant bruising.  Bleeding has stopped.  Wound care instructions have been given.  Sutures have already been placed.  They understand follow-up with primary care doctor to have stitches removed.  Patient placed in a sling for comfort.  Neurovascular neuromuscular intact.   Lennice Sites, DO 09/15/22 [id number]

## 2022-09-15 NOTE — Discharge Instructions (Addendum)
It was a pleasure caring for you today in the emergency department.  Please return to the emergency department for any worsening or worrisome symptoms.  Please put bacitracin or Neosporin ointment over your skin tears but not over the area where you have stitches.  Do this twice a day.  Wrap your arm as we instructed you.  You will need the stitches removed in 7 to 10 days by your primary care doctor.  Please return if you develop any signs of infection.

## 2022-09-15 NOTE — ED Provider Notes (Signed)
[facility] EMERGENCY DEPARTMENT Provider Note  CSN: [id number] Arrival date & time: 09/15/22 1029  Chief Complaint(s) Fall  HPI [name] Pratt is a 82 y.o. male with past medical history as below, significant for HTN, BPPV, carotid stenosis, COPD, CPAP use, GERD, uses walker at baseline who presents to the ED with complaint of fall. Pt with fall just pta, he was using walking and trying to go to the restroom, walker got stuck on the door jam and he fell, fell onto his right side. Unsure if he struck his head. His right side is weak chronically. Takes '81mg'$  asa qd. No n/v since the fall, he has slight headache but thinks it may be from his CPAP facemask strap. No chest pain or dib. Acting at baseline per family at bedside.   Past Medical History Past Medical History:  Diagnosis Date   Allergic rhinitis 02/02/2016   Anemia 12/17/2009   Formatting of this note might be different from the original. Anemia  10/1 IMO update   Aortic atherosclerosis ([place]) 03/05/2020   Arthritis    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   Atypical chest pain 11/24/2015   Benign essential hypertension 05/13/2009   Qualifier: Diagnosis of  By: Larwance Sachs of this note might be different from the original. 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 Carotid U/S 4/09: LICA < 81%, RICA 19-14%;  b.  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   Cystoid macular edema of both eyes 07/28/2020   Degenerative disc disease, lumbar 06/30/2015   Deviated nasal septum 02/18/2015   Diverticulosis 03/03/2020   DJD (degenerative joint disease) of hip 12/24/2012   Dyspnea 06/12/2011   Emphysema, unspecified ([place]) 03/05/2020   Erectile dysfunction 06/20/2017   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[place]) 02/09/2021   Gait disorder 03/02/2021   GERD (gastroesophageal reflux disease)    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   Qualifier: Diagnosis of  By: Burnett Kanaris     Greater trochanteric pain syndrome 07/22/2021   Hand pain, left 01/16/2022   Hearing loss 02/02/2016   Heme positive stool 02/29/2020   HIATAL HERNIA 05/13/2009   Qualifier: Diagnosis of  By: Burnett Kanaris     High risk medication use 01/18/2012   Hip pain 03/02/2021   History of chicken pox    History of COVID-19 12/05/2021   History of hemorrhoids    History of kidney stones    History of skin cancer 07/22/2021   skin cancer   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 Osteoarthritis Of The Hand  10/1 IMO  update   Osteoarthrosis, unspecified whether generalized or localized, pelvic region and thigh 07/25/2010   Formatting of this note might be different from the original. Osteoarthritis Of The Hip   Other abnormal glucose 07/22/2021   Other allergic rhinitis 02/18/2015   Palpitations 07/25/2021   Peripheral neuropathy 06/19/2016   Preventative health care 11/24/2015   Right groin pain 11/29/2012   Right inguinal hernia 05/13/2009   Qualifier: Diagnosis of  By: Burnett Kanaris     RLS (restless legs syndrome) 02/18/2015   Rosacea    Rotator cuff tear 07/27/2013   Situational depression 03/02/2021   Skin lesion 01/16/2022   Sleep apnea with use of continuous positive airway pressure (CPAP) 07/15/2013   CPAP set to  7 cm water,  Residual AHi was 7.8  With no leak.  User time 6 hours.  479 days , not used only 12 days - highly compliant 06-23-13  .    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   Urinary retention 11/26/2020   Ventral hernia 07/08/2012   Vertigo 07/08/2021   Weight loss 03/03/2020   Patient Active Problem List   Diagnosis Date Noted   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   Vitamin D deficiency 08/07/2022   Closed fracture of left hip ([place]) 08/07/2022   Hand weakness 05/02/2022   Acute kidney injury ([place]) 05/02/2022   Adenomatous polyp of ascending colon    Adenomatous polyp of colon    Acute metabolic encephalopathy 04/54/0981   GI bleed 04/21/2022   Vasovagal syncope    Hemorrhagic shock (HCC)    Symptomatic anemia    Hematochezia    Acute blood loss anemia    Irregular heart beat 02/03/2022   Constipation 02/03/2022   Carpal tunnel  syndrome 01/24/2022   Numbness of hand 01/24/2022   Ulnar neuropathy 01/24/2022   Skin lesion 01/16/2022   History of COVID-19 12/05/2021   Benign paroxysmal positional vertigo 09/14/2021   Palpitations 07/25/2021   Cardiac murmur 07/25/2021   History of chicken pox 07/22/2021   History of hemorrhoids 07/22/2021   Hypertension 07/22/2021   COPD (chronic obstructive pulmonary disease) ([place]) 07/22/2021   History of kidney stones 07/22/2021   History of skin cancer 19/14/7829   Complication of anesthesia 07/22/2021   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[place]) 07/22/2021   Other abnormal glucose 07/22/2021   Spondylitis ([place]) 07/22/2021   Greater trochanteric pain syndrome 07/22/2021   Vertigo 07/08/2021   Hypokalemia 07/08/2021   Trochanteric bursitis of left hip 06/30/2021   Cancer ([place]) 04/11/2021   Lumbar radiculopathy 03/02/2021   Gait instability 03/02/2021   Hip pain 03/02/2021   Situational depression 03/02/2021   Exudative age-related macular degeneration of right eye  with active choroidal neovascularization ([place]) 02/09/2021   Urinary retention 11/26/2020   Cervical myelopathy ([place]) 11/24/2020   Body mass index (BMI) 25.0-25.9, adult 11/02/2020   Lumbar spondylosis 11/02/2020   Lumbago of lumbar region with sciatica 10/21/2020   Cystoid macular edema of both eyes 07/28/2020   Status post cervical spinal fusion 07/09/2020   History of total bilateral knee replacement 07/08/2020   Aortic atherosclerosis ([place]) 03/05/2020   Emphysema, unspecified ([place]) 03/05/2020   Arthritis 03/04/2020   Diverticulosis 03/03/2020   Internal hemorrhoids 03/03/2020   Weight loss 03/03/2020   Heme positive stool 02/29/2020   Leg cramps 11/13/2019   Tibialis anterior tendon tear, nontraumatic 11/13/2019   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 07/29/2018   CPAP (continuous positive airway pressure) dependence 03/25/2018   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   Erectile dysfunction 06/20/2017    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 03/06/2017   Peripheral neuropathy 06/19/2016   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   Hearing loss 02/02/2016   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   Allergic rhinitis 02/02/2016   Atypical chest pain 11/24/2015   Preventative health care 11/24/2015   Onychomycosis 06/30/2015   Degenerative disc disease, lumbar 06/30/2015   Other allergic rhinitis 02/18/2015   Deviated nasal septum 02/18/2015   RLS (restless legs syndrome) 02/18/2015   GERD (gastroesophageal reflux disease) 09/18/2014   Hyperglycemia 03/03/2014   Rotator cuff tear 07/27/2013   Sleep apnea with use of continuous positive airway pressure (CPAP) 07/15/2013   Carotid stenosis 02/24/2013   Nonspecific abnormal electrocardiogram (ECG) (EKG) 01/06/2013   DJD (degenerative joint disease) of hip 12/24/2012   Lower GI bleed 12/12/2012   Right groin pain 11/29/2012   OSA (obstructive sleep apnea) 11/25/2012   Ventral hernia 07/08/2012   High risk medication use 01/18/2012   Dyspnea 06/12/2011   Hyperlipidemia with target LDL less than 130 12/27/2010   Osteoarthrosis, unspecified whether generalized or localized, pelvic region and thigh 07/25/2010   Osteoarthrosis, hand 06/13/2010   Anemia 12/17/2009   Hypothyroidism 05/13/2009   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   Benign essential hypertension 05/13/2009   Right inguinal hernia 05/13/2009   HIATAL HERNIA 05/13/2009   NEPHROLITHIASIS 05/13/2009   ROSACEA 05/13/2009   Home Medication(s) Prior to Admission medications   Medication Sig Start Date End Date Taking? Authorizing Provider  acetaminophen (TYLENOL) 650 MG CR tablet Take 650-1,300 mg by mouth every 8 (eight) hours as needed for pain.    [provider]  albuterol (VENTOLIN HFA) 108 (90 Base) MCG/ACT inhaler INHALE 2 PUFFS INTO THE LUNGS EVERY 6 HOURS AS NEEDED FOR SHORTNESS OF BREATH. Patient taking differently: Inhale 2 puffs into the lungs  every 6 (six) hours as needed for shortness of breath. 10/20/21   [name], NP  allopurinol (ZYLOPRIM) 100 MG tablet Take 1 tablet (100 mg total) by mouth 2 (two) times daily. 08/07/22   [name], NP  amLODipine (NORVASC) 5 MG tablet Take 1 tablet (5 mg total) by mouth daily. 08/28/22   [name], NP  antiseptic oral rinse (BIOTENE) LIQD 15 mLs by Mouth Rinse route in the morning.    [provider]  aspirin EC 81 MG tablet Take 81 mg by mouth at bedtime.    [provider]  cycloSPORINE (RESTASIS) 0.05 % ophthalmic emulsion Place 1 drop into both eyes 2 (two) times daily.    [provider]  diclofenac Sodium (VOLTAREN) 1 % GEL Apply 2 g topically daily as needed (for  arthritic pain in the hands).      docusate sodium (COLACE) 100 MG capsule Take 1 capsule (100 mg total) by mouth 2 (two) times daily. 04/27/22 04/27/23  Barb Merino, MD  gabapentin (NEURONTIN) 300 MG capsule Take 1 capsule by mouth at bedtime. 12/02/19   [provider]  GEMTESA 75 MG TABS Take 75 mg by mouth daily. 10/20/21   [provider]  glucose blood test strip TRUE Metrix Blood Glucose Test Strip, use as instructed 11/12/20   [name], NP  Homeopathic Products ([place]) FOAM Apply 1 application topically 2 (two) times daily as needed (Pain).    [provider]  hydrALAZINE (APRESOLINE) 50 MG tablet Take 1 tablet (50 mg total) by mouth 2 (two) times daily. 08/07/22   [name], NP  Iron, Ferrous Sulfate, 325 (65 Fe) MG TABS Take 325 mg by mouth every other day. 08/10/22   [name], NP  Lancets MISC 1 each by Does not apply route 2 (two) times daily. TRUE matrix lancets 11/12/20   [name], NP  levothyroxine (SYNTHROID) 150 MCG tablet Take 150 mcg 6 days a week (patient takes 75 mcg once a week) Disp #84 for 90 day supply 08/07/22   [name], NP  levothyroxine (SYNTHROID) 75 MCG tablet  Take 75 mcg once a week (patient on 150 mcg 6 days a week) Dispense #24 for 90 day supply. 08/07/22   [name], NP  loratadine (CLARITIN) 10 MG tablet Take 10 mg by mouth daily as needed for allergies or rhinitis.    [provider]  losartan (COZAAR) 100 MG tablet TAKE 1 TABLET EVERY DAY 08/10/22   [name], NP  Magnesium 500 MG TABS Take 500 mg by mouth daily.    [provider]  Multiple Vitamins-Minerals (MULTIVITAMIN WITH MINERALS) tablet Take 1 tablet by mouth daily.    [provider]  nitroGLYCERIN (NITROSTAT) 0.4 MG SL tablet Place 1 tablet (0.4 mg total) under the tongue every 5 (five) minutes as needed for chest pain. 09/16/21   [name], MD  ondansetron (ZOFRAN-ODT) 4 MG disintegrating tablet Take 1 tablet (4 mg total) by mouth every 8 (eight) hours as needed for nausea or vomiting. Patient not taking: Reported on 09/05/2022 11/17/21   Quintella Reichert, MD  polyethylene glycol (MIRALAX / GLYCOLAX) 17 g packet Take 17-34 g by mouth daily as needed for mild constipation.    [provider]  polyvinyl alcohol (ARTIFICIAL TEARS) 1.4 % ophthalmic solution Place 1 drop into both eyes as needed for dry eyes.    [provider]  PRESCRIPTION MEDICATION CPAP- At bedtime and during any time of rest    [provider]  Probiotic Product (PROBIOTIC DAILY PO) Take 1 capsule by mouth daily.    [provider]  rosuvastatin (CRESTOR) 5 MG tablet Take 1 tablet (5 mg total) by mouth daily. 08/07/22   [name], NP  sodium chloride (OCEAN) 0.65 % nasal spray Place 1 spray into both nostrils as needed for congestion.    [provider]  traMADol (ULTRAM) 50 MG tablet Take 1 tablet (50 mg total) by mouth every 12 (twelve) hours as needed. 01/16/22   [name], NP  vitamin B-12 (CYANOCOBALAMIN) 500 MCG tablet Take 500 mcg by mouth daily.    [provider]  Past Surgical History Past Surgical History:  Procedure Laterality Date   ABDOMINAL HERNIA REPAIR  07/15/12   Dr [name]   ANTERIOR CERVICAL DECOMP/DISCECTOMY FUSION N/A 07/09/2020   Procedure: ANTERIOR CERVICAL DECOMPRESSION AND FUSION CERVICAL THREE-FOUR.;  Surgeon: [name], MD;  Location: [facility];  Service: Neurosurgery;  Laterality: N/A;  anterior   BROW LIFT  05/07/01   COLONOSCOPY WITH PROPOFOL N/A 04/24/2022   Procedure: COLONOSCOPY WITH PROPOFOL;  Surgeon: Thornton Park, MD;  Location: [facility];  Service: Gastroenterology;  Laterality: N/A;   COLONOSCOPY WITH PROPOFOL N/A 04/26/2022   Procedure: COLONOSCOPY WITH PROPOFOL;  Surgeon: Thornton Park, MD;  Location: [facility];  Service: Gastroenterology;  Laterality: N/A;   CYSTOSCOPY WITH URETEROSCOPY AND STENT PLACEMENT Right 01/28/2014   Procedure: CYSTOSCOPY WITH URETEROSCOPY, BASKET RETRIVAL AND  STENT PLACEMENT;  Surgeon: [name], MD;  Location: [facility];  Service: Urology;  Laterality: Right;   epidural injections     multiple procedures   ESOPHAGEAL DILATION  1993 and 1994   multiple times   EYE SURGERY Bilateral 03/25/10, 2012   cataract removal   HEMORRHOID SURGERY  2014   HIATAL HERNIA REPAIR  12/21/93   HOLMIUM LASER APPLICATION Right 3/79/0240   Procedure: HOLMIUM LASER APPLICATION;  Surgeon: [name], MD;  Location: [facility];  Service: Urology;  Laterality: Right;   INGUINAL HERNIA REPAIR Right 07/15/12   Dr [name], x2   JOINT REPLACEMENT  07/25/04   right knee   JOINT REPLACEMENT  07/13/10   left knee   [place]  09/20/06   with intraoperative cholangiogram and right inguinal herniorrhaphy with mesh    LIGAMENT REPAIR Left 07/2013   shoulder   LITHOTRIPSY     x2   [place]   POLYPECTOMY  04/26/2022   Procedure: POLYPECTOMY;  Surgeon: Thornton Park, MD;  Location: [facility];  Service: Gastroenterology;;   POSTERIOR CERVICAL FUSION/FORAMINOTOMY N/A 11/24/2020   Procedure: Posterior Cervical Laminectomy Cervical three-four, Cervical four-five with lateral mass fusion;  Surgeon: [name], MD;  Location: [facility];  Service: Neurosurgery;  Laterality: N/A;   REFRACTIVE SURGERY Bilateral    Right total hip replacement  4/14   SKIN CANCER DESTRUCTION     nose, ear   TONSILLECTOMY  as child   TOTAL HIP ARTHROPLASTY Left    URETHRAL DILATION  1991   [name]   Family History Family History  Problem Relation Age of Onset   Cancer Mother    Hypertension Other    Cancer Other    Osteoarthritis Other    Stomach cancer Neg Hx    Colon cancer Neg Hx    Esophageal cancer Neg Hx     Social History Social History   Tobacco Use   Smoking status: Former    Years: 11.00    Types: Cigarettes    Quit date: 09/11/1978    Years since quitting: 44.0   Smokeless tobacco: Never  Vaping Use   Vaping Use: Never used  Substance Use Topics   Alcohol use: Not Currently    Alcohol/week: 0.0 standard drinks of alcohol    Comment: rare   Drug use: No   Allergies Pravastatin, Sildenafil, Oxycodone, Atenolol, Elastic bandages & [zinc], and Metoclopramide hcl  Review of Systems Review of Systems  Constitutional:  Negative for chills and fever.  HENT:  Negative for facial swelling and trouble swallowing.   Eyes:  Negative for photophobia and visual  disturbance.  Respiratory:  Negative for cough and shortness of breath.   Cardiovascular:  Negative for chest pain and palpitations.  Gastrointestinal:  Negative for abdominal pain, nausea and vomiting.  Endocrine: Negative for polydipsia and polyuria.  Genitourinary:  Negative for difficulty urinating and hematuria.  Musculoskeletal:  Positive for arthralgias. Negative for gait problem and joint swelling.  Skin:  Positive for wound. Negative for pallor and rash.  Neurological:  Positive  for headaches. Negative for syncope.  Psychiatric/Behavioral:  Negative for agitation and confusion.     Physical Exam Vital Signs  I have reviewed the triage vital signs BP 136/60   Pulse 64   Temp 97.9 F (36.6 C) (Oral)   Resp 16   Wt 69.4 kg   SpO2 96%   BMI 23.96 kg/m  Physical Exam Vitals and nursing note reviewed.  Constitutional:      General: He is not in acute distress.    Appearance: Normal appearance. He is well-developed. He is not diaphoretic.  HENT:     Head: Normocephalic and atraumatic. No raccoon eyes, Battle's sign, right periorbital erythema or left periorbital erythema.     Jaw: There is normal jaw occlusion.     Right Ear: External ear normal.     Left Ear: External ear normal.     Mouth/Throat:     Mouth: Mucous membranes are moist.  Eyes:     General: No scleral icterus. Cardiovascular:     Rate and Rhythm: Normal rate and regular rhythm.     Pulses: Normal pulses.     Heart sounds: Normal heart sounds.  Pulmonary:     Effort: Pulmonary effort is normal. No respiratory distress.     Breath sounds: Normal breath sounds.  Abdominal:     General: Abdomen is flat.     Palpations: Abdomen is soft.     Tenderness: There is no abdominal tenderness.  Musculoskeletal:        General: Normal range of motion.     Cervical back: Normal range of motion.     Right lower leg: No edema.     Left lower leg: No edema.     Comments: Full ROM to BLUE BLUE NVI BLLE NVI Pelvis stable to ap pressure No sig discomfort w/log roll b/l LE Large hematoma to right forearm, non pulsatile Radial/ulnar pulses intact RUE, cap refill is brisk   Skin:    General: Skin is warm and dry.     Capillary Refill: Capillary refill takes less than 2 seconds.     Comments: Multiple skin tears to BL UE Deep laceration to right proximal forearm Multiple forearm skin tears   Neurological:     Mental Status: He is alert and oriented to person, place, and time.     GCS: GCS eye  subscore is 4. GCS verbal subscore is 5. GCS motor subscore is 6.     Cranial Nerves: Cranial nerves 2-12 are intact.     Sensory: Sensation is intact.     Motor: Motor function is intact.     Coordination: Coordination is intact.     Comments: Gait not tested 2/2 pt safety   Psychiatric:        Mood and Affect: Mood normal.        Behavior: Behavior normal.     ED Results and Treatments Labs (all labs ordered are listed, but only abnormal results are displayed) Labs Reviewed  BASIC METABOLIC PANEL - Abnormal; Notable for the following components:  Result Value   Potassium 3.2 (*)    BUN 28 (*)    Calcium 8.7 (*)    All other components within normal limits  CBC - Abnormal; Notable for the following components:   WBC 10.6 (*)    RBC 3.32 (*)    Hemoglobin 10.2 (*)    HCT 31.9 (*)    All other components within normal limits                                                                                                                          Radiology CT Cervical Spine Wo Contrast  Result Date: 09/15/2022 CLINICAL DATA:  Mechanical fall. EXAM: CT CERVICAL SPINE WITHOUT CONTRAST TECHNIQUE: Multidetector CT imaging of the cervical spine was performed without intravenous contrast. Multiplanar CT image reconstructions were also generated. RADIATION DOSE REDUCTION: This exam was performed according to the departmental dose-optimization program which includes automated exposure control, adjustment of the mA and/or kV according to patient size and/or use of iterative reconstruction technique. COMPARISON:  01/13/2022 FINDINGS: Alignment: Normal. Skull base and vertebrae: No acute fracture. No primary bone lesion or focal pathologic process. Soft tissues and spinal canal: No prevertebral fluid or swelling. No visible canal hematoma. Disc levels: Stable appearance of anterior cervical discectomy with plating from C3-C4. Patient is status post posterior fusion from C3-C5 with pedicle screws  on the left at the C3 and C5 levels and pedicle screws on the right at C3, C4, and C5. Imaging features are stable in the interval. Marked loss of disc height with endplate degeneration noted at C4-5, C5-6, C6-7, and C7-T1. Upper chest: Unremarkable. Other: None. IMPRESSION: 1. No acute fracture or subluxation of the cervical spine. 2. Stable appearance of anterior and posterior cervical C3-5 fusion. Electronically Signed   By: Misty Stanley M.D.   On: 09/15/2022 12:05   CT Head Wo Contrast  Result Date: 09/15/2022 CLINICAL DATA:  Trauma, fall EXAM: CT HEAD WITHOUT CONTRAST TECHNIQUE: Contiguous axial images were obtained from the base of the skull through the vertex without intravenous contrast. RADIATION DOSE REDUCTION: This exam was performed according to the departmental dose-optimization program which includes automated exposure control, adjustment of the mA and/or kV according to patient size and/or use of iterative reconstruction technique. COMPARISON:  01/13/2022 FINDINGS: Brain: No acute intracranial findings are seen. There are no signs of bleeding within the cranium. Cortical sulci are prominent. 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 There is decreased density in periventricular and subcortical white matter. Vascular: Unremarkable. Skull: No fracture is seen in calvarium. Sinuses/Orbits: There is deviation of nasal septum to the right. There is minimal mucosal thickening in ethmoid sinus. Other: None. IMPRESSION: No acute intracranial findings are seen in noncontrast CT brain. Atrophy. Small-vessel disease. Electronically Signed   By: Elmer Picker M.D.   On: 09/15/2022 12:01   DG Pelvis 1-2 Views  Result Date: 09/15/2022 CLINICAL DATA:  Pain after fall EXAM: PELVIS-1 VIEW COMPARISON:  None  Available. FINDINGS: Bilateral hip arthroplasties are seen. This includes a Press-Fit right-sided arthroplasty and a Press-Fit left femoral and screw fixated acetabular cup component on the  left. Left-sided also has some cerclage wires. Overall expected alignment. No separate fracture or dislocation. Osteopenia. The extreme superior margin of the iliac crests are clipped off the edge of the film. Degenerative changes are noted of the lumbar spine at the edge of the imaging field. IMPRESSION: Bilateral hip arthroplasties.  Osteopenia. Please correlate with any prior to assess stability of the hardware Electronically Signed   By: Jill Side M.D.   On: 09/15/2022 11:56   DG Wrist Complete Left  Result Date: 09/15/2022 CLINICAL DATA:  Fall with laceration EXAM: LEFT WRIST - COMPLETE 3 VIEW COMPARISON:  01/13/2022 FINDINGS: Osteoarthritis especially advanced at the STT joint. Chronic fragmentation of the ulnar styloid. No acute fracture or dislocation. Osteopenia and arterial calcification. IMPRESSION: No acute finding. Electronically Signed   By: Jorje Guild M.D.   On: 09/15/2022 11:23   DG Forearm Right  Result Date: 09/15/2022 CLINICAL DATA:  Fall with laceration EXAM: RIGHT FOREARM - 2 VIEW COMPARISON:  None Available. FINDINGS: Soft tissue findings at the elbow described on dedicated exam. Ventral soft tissue swelling at the level of the mid forearm. No associated foreign body or fracture. Generalized osteopenia and arterial calcification. IMPRESSION: Ventral forearm swelling without associated foreign body or fracture. Electronically Signed   By: Jorje Guild M.D.   On: 09/15/2022 11:21   DG Elbow Complete Right  Result Date: 09/15/2022 CLINICAL DATA:  Fall with laceration EXAM: RIGHT ELBOW - COMPLETE 3 VIEW COMPARISON:  None Available. FINDINGS: Laceration at the lateral elbow with faint superficial debris along the skin surface that could be within the superficial skin or underneath the bandage. No fracture or subluxation. No elbow joint effusion. IMPRESSION: Skin wound with punctate superficial debris. No fracture or imbedded foreign body. Electronically Signed   By: Jorje Guild M.D.   On: 09/15/2022 11:20    Pertinent labs & imaging results that were available during my care of the patient were reviewed by me and considered in my medical decision making (see MDM for details).  Medications Ordered in ED Medications  iohexol (OMNIPAQUE) 350 MG/ML injection 100 mL (has no administration in time range)  lidocaine-EPINEPHrine (XYLOCAINE W/EPI) 2 %-1:100000 (with pres) injection 20 mL (20 mLs Other Given by Other 09/15/22 1238)  Tdap (BOOSTRIX) injection 0.5 mL (0.5 mLs Intramuscular Given 09/15/22 1234)  lidocaine-EPINEPHrine (XYLOCAINE W/EPI) 2 %-1:200000 (PF) injection (  Given by Other 09/15/22 1239)                                                                                                                                     Procedures Procedures  (including critical care time)  Medical Decision Making / ED Course   MDM:  TERRALL BLEY is a 82 y.o. male with past  medical history as below, significant for HTN, BPPV, carotid stenosis, COPD, CPAP use, GERD, uses walker at baseline who presents to the ED with complaint of fall. . The complaint involves an extensive differential diagnosis and also carries with it a high risk of complications and morbidity.  Serious etiology was considered. Ddx includes but is not limited to: Differential diagnoses for head trauma includes subdural hematoma, epidural hematoma, acute concussion, traumatic subarachnoid hemorrhage, cerebral contusions, etc. Soft tissue injury, lac/skin tear, fx, sprain,strain, etc.  On initial assessment the patient is: resting comfortably, bleeding controlled by dressings placed by EMS, AOX3, pain is mild. Neuro non-focal. Will get screening XR and CT head/neck, recheck Vital signs and nursing notes were reviewed    Pt with apparent mechanical fall at home, multiple injuries to forearms b/l. Laceration was repaired by PA at bedside, wound care performed by myself on multiple skin tears to  forearms. Tetanus updated. Imaging reviewed and is unremarkable. Pt with large expanding hematoma to his forearm although pulses are intact and it is non-pulsatile. Given expanding nature of hematoma will get angio to rule out vascular injury.   Signed out to incoming EDP pending CT   He is HDS      Additional history obtained: -Additional history obtained from spouse -External records from outside source obtained and reviewed including: Chart review including previous notes, labs, imaging, consultation notes including primary care documentation, home medications, prior labs/imaging    Lab Tests: -I ordered, reviewed, and interpreted labs.   The pertinent results include:   Labs Reviewed  BASIC METABOLIC PANEL - Abnormal; Notable for the following components:      Result Value   Potassium 3.2 (*)    BUN 28 (*)    Calcium 8.7 (*)    All other components within normal limits  CBC - Abnormal; Notable for the following components:   WBC 10.6 (*)    RBC 3.32 (*)    Hemoglobin 10.2 (*)    HCT 31.9 (*)    All other components within normal limits    Notable for K mildly low, labs o/w stable  EKG   EKG Interpretation  Date/Time:    Ventricular Rate:    PR Interval:    QRS Duration:   QT Interval:    QTC Calculation:   R Axis:     Text Interpretation:           Imaging Studies ordered: I ordered imaging studies including CTH CT C-spine, XR extremities I independently visualized the following imaging with scope of interpretation limited to determining acute life threatening conditions related to emergency care: CTH CTC/s, XR, which revealed imaging negative, pending CT angio of right arm I independently visualized and interpreted imaging. I agree with the radiologist interpretation   Medicines ordered and prescription drug management: Meds ordered this encounter  Medications   lidocaine-EPINEPHrine (XYLOCAINE W/EPI) 2 %-1:100000 (with pres) injection 20 mL   Tdap  (BOOSTRIX) injection 0.5 mL   lidocaine-EPINEPHrine (XYLOCAINE W/EPI) 2 %-1:200000 (PF) injection    [place], Candy F: cabinet override   iohexol (OMNIPAQUE) 350 MG/ML injection 100 mL    -I have reviewed the patients home medicines and have made adjustments as needed   Consultations Obtained: I requested consultation with the na,  and discussed lab and imaging findings as well as pertinent plan - they recommend: na   Cardiac Monitoring: The patient was maintained on a cardiac monitor.  I personally viewed and interpreted the cardiac monitored which showed an underlying  rhythm of: NSR  Social Determinants of Health:  Diagnosis or treatment significantly limited by social determinants of health: former smoker   Reevaluation: After the interventions noted above, I reevaluated the patient and found that they have improved  Co morbidities that complicate the patient evaluation  Past Medical History:  Diagnosis Date   Allergic rhinitis 02/02/2016   Anemia 12/17/2009   Formatting of this note might be different from the original. 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 Carotid U/S 5/28: LICA < 41%, RICA 32-44%;  b.  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   Cystoid macular edema of both eyes 07/28/2020   Degenerative disc disease, lumbar 06/30/2015   Deviated nasal septum 02/18/2015   Diverticulosis 03/03/2020   DJD (degenerative joint disease) of hip 12/24/2012   Dyspnea 06/12/2011   Emphysema, unspecified ([place]) 03/05/2020   Erectile dysfunction 06/20/2017   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[place]) 02/09/2021   Gait disorder 03/02/2021   GERD (gastroesophageal reflux disease)    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   Qualifier: Diagnosis of  By: Burnett Kanaris     Greater trochanteric pain syndrome 07/22/2021   Hand pain, left 01/16/2022   Hearing loss 02/02/2016   Heme positive stool 02/29/2020   HIATAL HERNIA 05/13/2009   Qualifier: Diagnosis of  By: Burnett Kanaris     High risk medication use 01/18/2012   Hip pain 03/02/2021   History of chicken pox    History of COVID-19 12/05/2021   History of hemorrhoids    History of kidney stones    History of skin cancer 07/22/2021   skin cancer   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 Cardiology - Dr Frances Nickels at 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 Osteoarthritis Of The Hand  10/1 IMO update   Osteoarthrosis, unspecified whether generalized  or localized, pelvic region and thigh 07/25/2010   Formatting of this note might be different from the original. Osteoarthritis Of The Hip   Other abnormal glucose 07/22/2021   Other allergic rhinitis 02/18/2015   Palpitations 07/25/2021   Peripheral neuropathy 06/19/2016   Preventative health care 11/24/2015   Right groin pain 11/29/2012   Right inguinal hernia 05/13/2009   Qualifier: Diagnosis of  By: Burnett Kanaris     RLS (restless legs syndrome) 02/18/2015   Rosacea    Rotator cuff tear 07/27/2013   Situational depression 03/02/2021   Skin lesion 01/16/2022   Sleep apnea with use of continuous positive airway pressure (CPAP) 07/15/2013   CPAP set to  7 cm water,  Residual AHi was 7.8  With no leak.  User time 6 hours.  479 days , not used only 12 days - highly compliant 06-23-13  .    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     Final Clinical Impression(s) / ED Diagnoses Final diagnoses:  Fall, initial encounter  Skin tear of right forearm without complication, initial encounter  Skin tear of left forearm without complication, initial encounter  Laceration of right elbow, initial encounter     This chart was dictated  using voice recognition software.  Despite best efforts to proofread,  errors can occur which can change the documentation meaning.    Wynona Dove A, DO 09/15/22 1557

## 2022-09-15 NOTE — ED Triage Notes (Signed)
Pt arrives pov, slow gait with cane, c/o mechanical Fall pta Endorses lacerations/skin tears bilaterally to upper extremities.  Bruising and skin tear noted to LUE, ~ 4cm Laceration noted to RUE, bleeding controlled.Tetanus utd per pt. Pt denies thinners

## 2022-09-15 NOTE — ED Notes (Signed)
Pt not removed from the ED in time. Pt OTF after d/c. Will remove pt.

## 2022-09-15 NOTE — ED Provider Notes (Signed)
..  Laceration Repair  Date/Time: 09/15/2022 4:02 PM  Performed by: Rex Kras, PA Authorized by: Rex Kras, PA   Consent:    Consent obtained:  Verbal   Consent given by:  Patient   Risks, benefits, and alternatives were discussed: yes     Risks discussed:  Infection and pain Universal protocol:    Procedure explained and questions answered to patient or proxy's satisfaction: yes     Relevant documents present and verified: yes     Patient identity confirmed:  Verbally with patient and arm band Anesthesia:    Anesthesia method:  Local infiltration   Local anesthetic:  Lidocaine 1% WITH epi Laceration details:    Location: R arm near elbow.   Length (cm):  5   Depth (mm):  0.5 Pre-procedure details:    Preparation:  Patient was prepped and draped in usual sterile fashion Exploration:    Limited defect created (wound extended): no     Hemostasis achieved with:  Direct pressure   Imaging outcome: foreign body not noted     Wound exploration: wound explored through full range of motion     Contaminated: no   Treatment:    Area cleansed with:  Chlorhexidine   Amount of cleaning:  Extensive   Irrigation solution:  Sterile water   Irrigation volume:  400   Irrigation method:  Pressure wash   Visualized foreign bodies/material removed: no     Debridement:  Minimal   Undermining:  Minimal   Scar revision: yes   Skin repair:    Repair method:  Sutures   Suture size:  4-0   Suture material:  Nylon   Suture technique:  Simple interrupted   Number of sutures:  5 Approximation:    Approximation:  Close Repair type:    Repair type:  Simple Post-procedure details:    Dressing:  Antibiotic ointment and sterile dressing   Procedure completion:  Tolerated well, no immediate complications     Rex Kras, PA 09/15/22 1605    Jeanell Sparrow, DO 09/18/22 2034

## 2022-09-15 NOTE — ED Notes (Signed)
ED Provider at bedside. 

## 2022-09-15 NOTE — ED Notes (Signed)
Pt. Has multiple bandages noted on each arm with bruising noted on the R upper arm and L arm as well.  Pt. Also complains of pain in the R groin area.  Pt. Able to move the R leg WNL with c/o minimal pain.  Pt. Is also able to move the L leg WNL with c/o minimal pain.  Pt. Reports he has hardware in each knee and hip from past history.    Pt. Reports no dizziness with fall today and his walker was the culprit of the fall

## 2022-09-18 ENCOUNTER — Ambulatory Visit (INDEPENDENT_AMBULATORY_CARE_PROVIDER_SITE_OTHER): Payer: Medicare HMO | Admitting: Family

## 2022-09-18 VITALS — BP 132/54 | HR 78 | Temp 97.9°F | Resp 16 | Wt 156.0 lb

## 2022-09-18 DIAGNOSIS — S51011D Laceration without foreign body of right elbow, subsequent encounter: Secondary | ICD-10-CM

## 2022-09-18 DIAGNOSIS — S51811D Laceration without foreign body of right forearm, subsequent encounter: Secondary | ICD-10-CM

## 2022-09-18 DIAGNOSIS — W19XXXD Unspecified fall, subsequent encounter: Secondary | ICD-10-CM | POA: Diagnosis not present

## 2022-09-18 DIAGNOSIS — I10 Essential (primary) hypertension: Secondary | ICD-10-CM

## 2022-09-18 DIAGNOSIS — W19XXXA Unspecified fall, initial encounter: Secondary | ICD-10-CM | POA: Insufficient documentation

## 2022-09-18 DIAGNOSIS — S51011A Laceration without foreign body of right elbow, initial encounter: Secondary | ICD-10-CM | POA: Insufficient documentation

## 2022-09-18 DIAGNOSIS — D509 Iron deficiency anemia, unspecified: Secondary | ICD-10-CM | POA: Insufficient documentation

## 2022-09-18 DIAGNOSIS — S51812D Laceration without foreign body of left forearm, subsequent encounter: Secondary | ICD-10-CM | POA: Insufficient documentation

## 2022-09-18 DIAGNOSIS — S81812A Laceration without foreign body, left lower leg, initial encounter: Secondary | ICD-10-CM | POA: Insufficient documentation

## 2022-09-18 DIAGNOSIS — S51811A Laceration without foreign body of right forearm, initial encounter: Secondary | ICD-10-CM | POA: Insufficient documentation

## 2022-09-18 HISTORY — DX: Laceration without foreign body of left forearm, subsequent encounter: S51.812D

## 2022-09-18 HISTORY — DX: Unspecified fall, initial encounter: W19.XXXA

## 2022-09-18 HISTORY — DX: Iron deficiency anemia, unspecified: D50.9

## 2022-09-18 NOTE — Progress Notes (Signed)
Subjective:   By signing my name below, I, [name], attest that this documentation has been prepared under the direction and in the presence of [name], NP. 09/18/2022   Patient ID: [name] Pratt, male    DOB: May 02, 1941, 82 y.o.   MRN: [id number]  Chief Complaint  Patient presents with   Hypertension    Here for follow up   Fall    Was seen at ed for fall, 09/25/22.      Patient is in today for a follow up visit.   Fall: He fell on 09/15/2022 while in the bathroom. He tripped while trying to maneuver his walker exiting the bathroom and fell forward towards his walker. He was taken to the emergency room and received stitches on his elbow. He is currently wearing bandages over his stiches. He had no obvious injury on CT C spine, head or RUE.   Blood pressure: His amlodipine was increased during his last visit. His blood pressure is doing well while taking 5 mg amlodipine, 100 losartan and reports no new issue while taking them. He is measuring his blood pressure at home and reports they measure 118-145/65-70.  BP Readings from Last 3 Encounters:  09/18/22 (!) 132/54  09/15/22 (!) 154/79  08/28/22 (!) 175/65   Pulse Readings from Last 3 Encounters:  09/18/22 78  09/15/22 76  08/28/22 69   Iron: His last iron levels were low. He continues iron supplementation.  Lab Results  Component Value Date   IRON 35 (L) 08/07/2022   TIBC 323 11/13/2019   FERRITIN 120.9 08/07/2022    Past Medical History:  Diagnosis Date   Allergic rhinitis 02/02/2016   Anemia 12/17/2009   Formatting of this note might be different from the original. Anemia  10/1 IMO update   Aortic atherosclerosis ([place]) 03/05/2020   Arthritis    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   Atypical chest pain 11/24/2015   Benign essential hypertension 05/13/2009   Qualifier: Diagnosis of  By: Larwance Sachs of this note might be different from the original.  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 Carotid U/S 8/50: LICA < 27%, RICA 74-12%;  b.  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   Cystoid macular edema of both eyes 07/28/2020   Degenerative disc disease, lumbar 06/30/2015   Deviated nasal septum 02/18/2015   Diverticulosis 03/03/2020   DJD (degenerative joint disease) of hip 12/24/2012   Dyspnea 06/12/2011   Emphysema, unspecified ([place]) 03/05/2020   Erectile dysfunction 06/20/2017   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[place]) 02/09/2021   Gait disorder 03/02/2021   GERD (gastroesophageal reflux disease)    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   Qualifier: Diagnosis of  By: Burnett Kanaris     Greater trochanteric pain syndrome 07/22/2021   Hand pain, left 01/16/2022   Hearing loss 02/02/2016   Heme positive stool  02/29/2020   HIATAL HERNIA 05/13/2009   Qualifier: Diagnosis of  By: Burnett Kanaris     High risk medication use 01/18/2012   Hip pain 03/02/2021   History of chicken pox    History of COVID-19 12/05/2021   History of hemorrhoids    History of kidney stones    History of skin cancer 07/22/2021   skin cancer   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 Cardiology - Dr  Frances Nickels at [facility] cardiology  ICD-10 cut over   Hypertension    under control   Hypokalemia 07/08/2021   Hypothyroidism    Internal hemorrhoids 03/03/2020   Leg cramps 11/13/2019   Lower GI bleed 12/12/2012   Lumbago of lumbar region with sciatica 10/21/2020   Lumbar radiculopathy 03/02/2021   Lumbar spondylosis 11/02/2020   NEPHROLITHIASIS 05/13/2009   Qualifier: Diagnosis of  By: Burnett Kanaris     Nonspecific abnormal electrocardiogram (ECG) (EKG) 01/06/2013   Numbness of hand 01/24/2022   Onychomycosis 06/30/2015   Osteoarthrosis, 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 Osteoarthritis Of The Hand  10/1 IMO update   Osteoarthrosis, unspecified whether generalized or localized, pelvic region and thigh 07/25/2010   Formatting of this note might be different from the original. Osteoarthritis Of The Hip   Other abnormal glucose 07/22/2021   Other allergic rhinitis 02/18/2015   Palpitations 07/25/2021   Peripheral neuropathy 06/19/2016   Preventative health care 11/24/2015   Right groin pain 11/29/2012   Right inguinal hernia 05/13/2009   Qualifier: Diagnosis of  By: Burnett Kanaris     RLS (restless legs syndrome) 02/18/2015   Rosacea    Rotator cuff tear 07/27/2013   Situational depression 03/02/2021   Skin lesion 01/16/2022   Sleep apnea with use of continuous positive airway pressure (CPAP) 07/15/2013   CPAP set to  7 cm water,  Residual AHi was 7.8  With no leak.  User time 6 hours.  479 days , not used only 12 days - highly compliant 06-23-13  .    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   Urinary  retention 11/26/2020   Ventral hernia 07/08/2012   Vertigo 07/08/2021   Weight loss 03/03/2020    Past Surgical History:  Procedure Laterality Date   ABDOMINAL HERNIA REPAIR  07/15/12   Dr [name]   ANTERIOR CERVICAL DECOMP/DISCECTOMY FUSION N/A 07/09/2020   Procedure: ANTERIOR CERVICAL DECOMPRESSION AND FUSION CERVICAL THREE-FOUR.;  Surgeon: [name], MD;  Location: [place];  Service: Neurosurgery;  Laterality: N/A;  anterior   BROW LIFT  05/07/01   COLONOSCOPY WITH PROPOFOL N/A 04/24/2022   Procedure: COLONOSCOPY WITH PROPOFOL;  Surgeon: Thornton Park, MD;  Location: [place];  Service: Gastroenterology;  Laterality: N/A;   COLONOSCOPY WITH PROPOFOL N/A 04/26/2022   Procedure: COLONOSCOPY WITH PROPOFOL;  Surgeon: Thornton Park, MD;  Location: [place];  Service: Gastroenterology;  Laterality: N/A;   CYSTOSCOPY WITH URETEROSCOPY AND STENT PLACEMENT Right 01/28/2014   Procedure: CYSTOSCOPY WITH URETEROSCOPY, BASKET RETRIVAL AND  STENT PLACEMENT;  Surgeon: [name], MD;  Location: [facility];  Service: Urology;  Laterality: Right;   epidural injections     multiple procedures   ESOPHAGEAL DILATION  1993 and 1994   multiple times   EYE SURGERY Bilateral 03/25/10, 2012   cataract removal   HEMORRHOID SURGERY  2014   HIATAL HERNIA REPAIR  12/21/93   HOLMIUM LASER APPLICATION Right 1/47/8295   Procedure: HOLMIUM LASER APPLICATION;  Surgeon: [name], MD;  Location: [facility];  Service: Urology;  Laterality: Right;   INGUINAL HERNIA REPAIR Right 07/15/12   Dr [name], x2   JOINT REPLACEMENT  07/25/04   right knee   JOINT REPLACEMENT  07/13/10   left knee   [place]  09/20/06   with intraoperative cholangiogram and right inguinal herniorrhaphy with mesh    LIGAMENT REPAIR Left 07/2013   shoulder   LITHOTRIPSY     x2   [place]   POLYPECTOMY  04/26/2022   Procedure: POLYPECTOMY;  Surgeon: Thornton Park, MD;   Location: [place];  Service: Gastroenterology;;   POSTERIOR CERVICAL FUSION/FORAMINOTOMY N/A 11/24/2020   Procedure: Posterior Cervical Laminectomy Cervical three-four, Cervical four-five with lateral mass fusion;  Surgeon: [name], MD;  Location: [place];  Service: Neurosurgery;  Laterality: N/A;   REFRACTIVE SURGERY Bilateral    Right total hip replacement  4/14   SKIN CANCER DESTRUCTION     nose, ear   TONSILLECTOMY  as child   TOTAL HIP ARTHROPLASTY Left    URETHRAL DILATION  1991   [name]    Family History  Problem Relation Age of Onset   Cancer Mother    Hypertension Other    Cancer Other    Osteoarthritis Other    Stomach cancer Neg Hx    Colon cancer Neg Hx    Esophageal cancer Neg Hx     Social History   Socioeconomic History   Marital status: Married    Spouse name: Not on file   Number of children: 1   Years of education: Not on file   Highest education level: Not on file  Occupational History   Occupation: firefighter    Comment: paints on the side   Occupation: retired  Tobacco Use   Smoking status: Former    Years: 11.00    Types: Cigarettes    Quit date: 09/11/1978    Years since quitting: 44.0   Smokeless tobacco: Never  Vaping Use   Vaping Use: Never used  Substance and Sexual Activity   Alcohol use: Not Currently    Alcohol/week: 0.0 standard drinks of alcohol    Comment: rare   Drug use: No   Sexual activity: Not Currently  Other Topics Concern   Not on file  Social History Narrative   Lives with his wife   He has one son- lives locally.   2 grandchildren   Has worked for Research officer, trade union   Enjoys wood working   Completed HS.  [employer]   Social Determinants of Health   Financial Resource Strain: Low Risk  (10/28/2021)   Overall Financial Resource Strain (CARDIA)    Difficulty of Paying Living Expenses: Not very hard  Food Insecurity: No Food Insecurity (06/05/2022)   Hunger Vital Sign    Worried About Running Out of Food  in the Last Year: Never true    Ran Out of Food in the Last Year: Never true  Transportation Needs: Unknown (06/05/2022)   PRAPARE - Hydrologist (Medical): No    Lack of Transportation (Non-Medical): Not on file  Physical Activity: Insufficiently Active (10/28/2021)   Exercise Vital Sign    Days of Exercise per Week: 2 days    Minutes of Exercise per Session: 30 min  Stress: No Stress Concern Present (01/19/2022)   [place]    Feeling of Stress : Only a little  Social Connections: Moderately Isolated (01/19/2022)   Social Connection and Isolation Panel [NHANES]    Frequency of Communication with Friends and Family: More than three times a week    Frequency of Social Gatherings with Friends and Family: More than three times a week    Attends Religious Services: Never    Marine scientist or Organizations: No    Attends Archivist Meetings: Never    Marital Status: Married  Human resources officer Violence: Not At Risk (01/19/2022)   Humiliation, Afraid, Rape, and Kick questionnaire    Fear of Current or Ex-Partner: No    Emotionally Abused: No    Physically Abused: No    Sexually Abused: No    Outpatient Medications Prior to Visit  Medication Sig Dispense Refill   acetaminophen (TYLENOL) 650 MG CR tablet Take 650-1,300 mg by mouth every 8 (eight) hours as needed for pain.     albuterol (VENTOLIN HFA) 108 (90 Base) MCG/ACT inhaler INHALE 2 PUFFS INTO THE LUNGS EVERY 6 HOURS AS NEEDED FOR SHORTNESS OF BREATH. (Patient taking differently: Inhale 2 puffs into the lungs every 6 (six) hours as needed for shortness of breath.) 1 each 3   allopurinol (ZYLOPRIM) 100 MG tablet Take 1 tablet (100 mg total) by mouth 2 (two) times daily. 180 tablet 1   amLODipine (NORVASC) 5 MG tablet Take 1 tablet (5 mg total) by mouth daily. 90 tablet 1   antiseptic oral rinse (BIOTENE) LIQD 15 mLs by Mouth  Rinse route in the morning.     aspirin EC 81 MG tablet Take 81 mg by mouth at bedtime.     bacitracin ointment Apply 1 Application topically 2 (two) times daily. 120 g 0   cycloSPORINE (RESTASIS) 0.05 % ophthalmic emulsion Place 1 drop into both eyes 2 (two) times daily.     diclofenac Sodium (VOLTAREN) 1 % GEL Apply 2 g topically daily as needed (for arthritic pain in the hands).     gabapentin (NEURONTIN) 300 MG capsule Take 1 capsule by mouth at bedtime.     GEMTESA 75 MG TABS Take 75 mg by mouth daily.     glucose blood test strip TRUE Metrix Blood Glucose Test Strip, use as instructed 100 each 12   Homeopathic Products (THERAWORX RELIEF) FOAM Apply 1 application topically 2 (two) times daily as needed (Pain).     hydrALAZINE (APRESOLINE) 50 MG tablet Take 1 tablet (50 mg total) by mouth 2 (two) times daily. 180 tablet 1   Iron, Ferrous Sulfate, 325 (65 Fe) MG TABS Take 325 mg by mouth every other day. 30 tablet 0   Lancets MISC 1 each by Does not apply route 2 (two) times daily. TRUE matrix lancets 100 each 3   levothyroxine (SYNTHROID) 150 MCG tablet Take 150 mcg 6 days a week (patient takes 75 mcg once a week) Disp #84 for 90 day supply 84 tablet 1   levothyroxine (SYNTHROID) 75 MCG tablet Take 75 mcg once a week (  patient on 150 mcg 6 days a week) Dispense #24 for 90 day supply. 12 tablet 1   loratadine (CLARITIN) 10 MG tablet Take 10 mg by mouth daily as needed for allergies or rhinitis.     losartan (COZAAR) 100 MG tablet TAKE 1 TABLET EVERY DAY 90 tablet 1   Magnesium 500 MG TABS Take 500 mg by mouth daily.     Multiple Vitamins-Minerals (MULTIVITAMIN WITH MINERALS) tablet Take 1 tablet by mouth daily.     nitroGLYCERIN (NITROSTAT) 0.4 MG SL tablet Place 1 tablet (0.4 mg total) under the tongue every 5 (five) minutes as needed for chest pain. 25 tablet 6   polyethylene glycol (MIRALAX / GLYCOLAX) 17 g packet Take 17-34 g by mouth daily as needed for mild constipation.     polyvinyl  alcohol (ARTIFICIAL TEARS) 1.4 % ophthalmic solution Place 1 drop into both eyes as needed for dry eyes.     PRESCRIPTION MEDICATION CPAP- At bedtime and during any time of rest     Probiotic Product (PROBIOTIC DAILY PO) Take 1 capsule by mouth daily.     rosuvastatin (CRESTOR) 5 MG tablet Take 1 tablet (5 mg total) by mouth daily. 90 tablet 1   traMADol (ULTRAM) 50 MG tablet Take 1 tablet (50 mg total) by mouth every 12 (twelve) hours as needed. 10 tablet 0   vitamin B-12 (CYANOCOBALAMIN) 500 MCG tablet Take 500 mcg by mouth daily.     docusate sodium (COLACE) 100 MG capsule Take 1 capsule (100 mg total) by mouth 2 (two) times daily. 60 capsule 11   ondansetron (ZOFRAN-ODT) 4 MG disintegrating tablet Take 1 tablet (4 mg total) by mouth every 8 (eight) hours as needed for nausea or vomiting. (Patient not taking: Reported on 09/05/2022) 10 tablet 0   sodium chloride (OCEAN) 0.65 % nasal spray Place 1 spray into both nostrils as needed for congestion.     No facility-administered medications prior to visit.    Allergies  Allergen Reactions   Pravastatin Other (See Comments)    Muscle cramps   Sildenafil Other (See Comments)    Tachycardia     Oxycodone Other (See Comments)    Hallucinations    Atenolol Other (See Comments)    Bradycardia    Elastic Bandages & [Zinc] Other (See Comments)    Teflon bandages - Irritation   Metoclopramide Hcl Anxiety    ROS    See HPI Objective:    Physical Exam Constitutional:      General: He is not in acute distress.    Appearance: Normal appearance. He is not ill-appearing.  HENT:     Head: Normocephalic and atraumatic.     Right Ear: External ear normal.     Left Ear: External ear normal.  Eyes:     Extraocular Movements: Extraocular movements intact.     Pupils: Pupils are equal, round, and reactive to light.  Cardiovascular:     Rate and Rhythm: Normal rate and regular rhythm.     Heart sounds: Normal heart sounds. No murmur  heard.    No gallop.  Pulmonary:     Effort: Pulmonary effort is normal. No respiratory distress.     Breath sounds: Normal breath sounds. No wheezing or rales.  Musculoskeletal:     Comments: Patient is using walker to assist with mobility  Skin:    General: Skin is warm and dry.     Comments: Skin tear right forearm dorsally, sutures in tact right lateral elbow with  surrounding skin tears  Neurological:     Mental Status: He is alert and oriented to person, place, and time.  Psychiatric:        Judgment: Judgment normal.     BP (!) 132/54 (BP Location: Left Arm, Patient Position: Sitting, Cuff Size: Small)   Pulse 78   Temp 97.9 F (36.6 C) (Oral)   Resp 16   Wt 156 lb (70.8 kg)   SpO2 100%   BMI 24.43 kg/m  Wt Readings from Last 3 Encounters:  09/18/22 156 lb (70.8 kg)  09/15/22 153 lb (69.4 kg)  08/28/22 149 lb (67.6 kg)       Assessment & Plan:  Hypertension, unspecified type Assessment & Plan: BP Readings from Last 3 Encounters:  09/18/22 (!) 132/54  09/15/22 (!) 154/79  08/28/22 (!) 175/65   BP is stable and improved on increased dose of amlodipine. Continue same.    Skin tear of right elbow without complication, subsequent encounter Assessment & Plan: Sutures intact.  He will return on 1/12 for removal.  Wound cleaned and dressed.    Skin tear of right forearm without complication, subsequent encounter Assessment & Plan: Wound cleaned and dressed.   Fall, subsequent encounter Assessment & Plan: Thankfully he did not have any head trauma or fractures.  Wounds seem to be healing appropriately.  He will return Friday for suture removal. Wife is continuing daily dressing changes. He will continue to use his walker.    Iron deficiency anemia, unspecified iron deficiency anemia type    I, Nance Pear, NP, personally preformed the services described in this documentation.  All medical record entries made by the scribe were at my direction and  in my presence.  I have reviewed the chart and discharge instructions (if applicable) and agree that the record reflects my personal performance and is accurate and complete. 09/18/2022  30 minutes spent on today's visit. Time was spent interviewing patient, reviewing medical record and changing dressing.  I,Shehryar Multimedia programmer as a Education administrator for [employer], NP.,have documented all relevant documentation on the behalf of Nance Pear, NP,as directed by  Nance Pear, NP while in the presence of Nance Pear, NP.   Nance Pear, NP

## 2022-09-18 NOTE — Assessment & Plan Note (Signed)
Sutures intact.  He will return on 1/12 for removal.  Wound cleaned and dressed.

## 2022-09-18 NOTE — Assessment & Plan Note (Addendum)
BP Readings from Last 3 Encounters:  09/18/22 (!) 132/54  09/15/22 (!) 154/79  08/28/22 (!) 175/65   BP is stable and improved on increased dose of amlodipine. Continue same.

## 2022-09-18 NOTE — Assessment & Plan Note (Addendum)
Thankfully he did not have any head trauma or fractures.  Wounds seem to be healing appropriately.  He will return Friday for suture removal. Wife is continuing daily dressing changes. He will continue to use his walker.

## 2022-09-18 NOTE — Assessment & Plan Note (Signed)
Lab Results  Component Value Date   WBC 10.6 (H) 09/15/2022   HGB 10.2 (L) 09/15/2022   HCT 31.9 (L) 09/15/2022   MCV 96.1 09/15/2022   PLT 323 09/15/2022   Stable H/H. Continue iron supplement.

## 2022-09-18 NOTE — Assessment & Plan Note (Signed)
Wound cleaned and dressed.

## 2022-09-19 ENCOUNTER — Ambulatory Visit: Payer: Medicare HMO

## 2022-09-20 ENCOUNTER — Encounter: Payer: Self-pay | Admitting: Physical Therapy

## 2022-09-20 ENCOUNTER — Ambulatory Visit: Payer: Medicare HMO | Admitting: Physical Therapy

## 2022-09-20 DIAGNOSIS — M5459 Other low back pain: Secondary | ICD-10-CM | POA: Diagnosis not present

## 2022-09-20 DIAGNOSIS — R2689 Other abnormalities of gait and mobility: Secondary | ICD-10-CM | POA: Diagnosis not present

## 2022-09-20 DIAGNOSIS — R2681 Unsteadiness on feet: Secondary | ICD-10-CM

## 2022-09-20 DIAGNOSIS — M6281 Muscle weakness (generalized): Secondary | ICD-10-CM | POA: Diagnosis not present

## 2022-09-20 DIAGNOSIS — M25662 Stiffness of left knee, not elsewhere classified: Secondary | ICD-10-CM | POA: Diagnosis not present

## 2022-09-20 DIAGNOSIS — R296 Repeated falls: Secondary | ICD-10-CM

## 2022-09-20 NOTE — Therapy (Signed)
OUTPATIENT PHYSICAL THERAPY TREATMENT   Patient Name: [name] Pratt MRN: [id number] DOB:[birth year], 82 y.o., male Today's Date: 09/20/2022   END OF SESSION:  PT End of Session - 09/20/22 1145     Visit Number 4    Date for PT Re-Evaluation 10/31/22    Authorization Type [name]    Authorization Time Period 09/05/22-10/31/22    Authorization - Visit Number 4    Authorization - Number of Visits 10    PT Start Time 4268    PT Stop Time 1237    PT Time Calculation (min) 52 min    Activity Tolerance Patient tolerated treatment well    Behavior During Therapy [facility] for tasks assessed/performed              Past Medical History:  Diagnosis Date   Allergic rhinitis 02/02/2016   Anemia 12/17/2009   Formatting of this note might be different from the original. Anemia  10/1 IMO update   Aortic atherosclerosis ([place]) 03/05/2020   Arthritis    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   Atypical chest pain 11/24/2015   Benign essential hypertension 05/13/2009   Qualifier: Diagnosis of  By: Larwance Sachs of this note might be different from the original. Hypertension   Benign paroxysmal positional vertigo 09/14/2021   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[place]) 07/22/2021   Body mass index (BMI) 25.0-25.9, adult 11/02/2020   Cancer ([place])    skin cancer   Cardiac murmur 07/25/2021   Carotid stenosis    a. Carotid U/S 3/41: LICA < 96%, RICA 22-29%;  b.  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   Cystoid macular edema of both eyes 07/28/2020   Degenerative disc disease, lumbar 06/30/2015   Deviated nasal  septum 02/18/2015   Diverticulosis 03/03/2020   DJD (degenerative joint disease) of hip 12/24/2012   Dyspnea 06/12/2011   Emphysema, unspecified ([place]) 03/05/2020   Erectile dysfunction 06/20/2017   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[place]) 02/09/2021   Gait disorder 03/02/2021   GERD (gastroesophageal reflux disease)    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   Qualifier: Diagnosis of  By: Burnett Kanaris     Greater trochanteric pain syndrome 07/22/2021   Hand pain, left 01/16/2022   Hearing loss 02/02/2016   Heme positive stool 02/29/2020   HIATAL HERNIA 05/13/2009   Qualifier: Diagnosis of  By: Burnett Kanaris     High risk medication use 01/18/2012   Hip pain 03/02/2021   History of chicken pox    History of COVID-19 12/05/2021   History of hemorrhoids    History of kidney stones    History of skin cancer 07/22/2021   skin cancer   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 Cardiology - Dr Frances Nickels at [facility] cardiology  ICD-10 cut over   Hypertension    under control   Hypokalemia 07/08/2021   Hypothyroidism    Internal hemorrhoids 03/03/2020   Leg cramps 11/13/2019   Lower GI  bleed 12/12/2012   Lumbago of lumbar region with sciatica 10/21/2020   Lumbar radiculopathy 03/02/2021   Lumbar spondylosis 11/02/2020   NEPHROLITHIASIS 05/13/2009   Qualifier: Diagnosis of  By: Burnett Kanaris     Nonspecific abnormal electrocardiogram (ECG) (EKG) 01/06/2013   Numbness of hand 01/24/2022   Onychomycosis 06/30/2015   Osteoarthrosis, 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 Osteoarthritis Of The Hand  10/1 IMO update   Osteoarthrosis, unspecified whether generalized or localized, pelvic region and thigh 07/25/2010   Formatting of this note  might be different from the original. Osteoarthritis Of The Hip   Other abnormal glucose 07/22/2021   Other allergic rhinitis 02/18/2015   Palpitations 07/25/2021   Peripheral neuropathy 06/19/2016   Preventative health care 11/24/2015   Right groin pain 11/29/2012   Right inguinal hernia 05/13/2009   Qualifier: Diagnosis of  By: Burnett Kanaris     RLS (restless legs syndrome) 02/18/2015   Rosacea    Rotator cuff tear 07/27/2013   Situational depression 03/02/2021   Skin lesion 01/16/2022   Sleep apnea with use of continuous positive airway pressure (CPAP) 07/15/2013   CPAP set to  7 cm water,  Residual AHi was 7.8  With no leak.  User time 6 hours.  479 days , not used only 12 days - highly compliant 06-23-13  .    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   Urinary retention 11/26/2020   Ventral hernia 07/08/2012   Vertigo 07/08/2021   Weight loss 03/03/2020   Past Surgical History:  Procedure Laterality Date   ABDOMINAL HERNIA REPAIR  07/15/12   Dr [name]   ANTERIOR CERVICAL DECOMP/DISCECTOMY FUSION N/A 07/09/2020   Procedure: ANTERIOR CERVICAL DECOMPRESSION AND FUSION CERVICAL THREE-FOUR.;  Surgeon: [name], MD;  Location: [place];  Service: Neurosurgery;  Laterality: N/A;  anterior   BROW LIFT  05/07/01   COLONOSCOPY WITH PROPOFOL N/A 04/24/2022   Procedure: COLONOSCOPY WITH PROPOFOL;  Surgeon: Thornton Park, MD;  Location: [place];  Service: Gastroenterology;  Laterality: N/A;   COLONOSCOPY WITH PROPOFOL N/A 04/26/2022   Procedure: COLONOSCOPY WITH PROPOFOL;  Surgeon: Thornton Park, MD;  Location: [place];  Service:  Gastroenterology;  Laterality: N/A;   CYSTOSCOPY WITH URETEROSCOPY AND STENT PLACEMENT Right 01/28/2014   Procedure: CYSTOSCOPY WITH URETEROSCOPY, BASKET RETRIVAL AND  STENT PLACEMENT;  Surgeon: [name], MD;  Location: [facility];  Service: Urology;  Laterality: Right;   epidural injections     multiple procedures   ESOPHAGEAL DILATION  1993 and 1994   multiple times   EYE SURGERY Bilateral 03/25/10, 2012   cataract removal   HEMORRHOID SURGERY  2014   HIATAL HERNIA REPAIR  12/21/93   HOLMIUM LASER APPLICATION Right 9/62/2297   Procedure: HOLMIUM LASER APPLICATION;  Surgeon: [name], MD;  Location: [facility];  Service: Urology;  Laterality: Right;   INGUINAL HERNIA REPAIR Right 07/15/12   Dr [name], x2   JOINT REPLACEMENT  07/25/04   right knee   JOINT REPLACEMENT  07/13/10   left knee   [place]  09/20/06   with intraoperative cholangiogram and  right inguinal herniorrhaphy with mesh    LIGAMENT REPAIR Left 07/2013   shoulder   LITHOTRIPSY     x2   NASAL SEPTUM SURGERY  1975   POLYPECTOMY  04/26/2022   Procedure: POLYPECTOMY;  Surgeon: Thornton Park, MD;  Location: [place];  Service: Gastroenterology;;   POSTERIOR CERVICAL FUSION/FORAMINOTOMY N/A 11/24/2020   Procedure: Posterior Cervical Laminectomy Cervical three-four, Cervical four-five with lateral mass fusion;  Surgeon: [name], MD;  Location: [place];  Service: Neurosurgery;  Laterality: N/A;   REFRACTIVE SURGERY Bilateral    Right total hip replacement  4/14   SKIN CANCER DESTRUCTION     nose, ear   TONSILLECTOMY  as child   TOTAL HIP ARTHROPLASTY Left    URETHRAL DILATION  1991   [name]   Patient Active Problem List   Diagnosis Date Noted   Skin tear of right elbow without complication 29/93/7169   Skin tear of right forearm without complication 67/89/3810   Fall 09/18/2022   Iron deficiency anemia 09/18/2022   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   Vitamin D  deficiency 08/07/2022   Closed fracture of left hip ([place]) 08/07/2022   Hand weakness 05/02/2022   Acute kidney injury ([place]) 05/02/2022   Adenomatous polyp of ascending colon    Adenomatous polyp of colon    Acute metabolic encephalopathy 17/51/0258   GI bleed 04/21/2022   Vasovagal syncope    Hemorrhagic shock (HCC)    Hematochezia    Irregular heart beat 02/03/2022   Constipation 02/03/2022   Carpal tunnel syndrome 01/24/2022   Numbness of hand 01/24/2022   Ulnar neuropathy 01/24/2022   Skin lesion 01/16/2022   History of COVID-19 12/05/2021   Benign paroxysmal positional vertigo 09/14/2021   Palpitations 07/25/2021   Cardiac murmur 07/25/2021   History of chicken pox 07/22/2021   History of hemorrhoids 07/22/2021   Hypertension 07/22/2021   COPD (chronic obstructive pulmonary disease) ([place]) 07/22/2021   History of kidney stones 07/22/2021   History of skin cancer 52/77/8242   Complication of anesthesia 07/22/2021   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[place]) 07/22/2021   Other abnormal glucose 07/22/2021   Spondylitis ([place]) 07/22/2021   Greater trochanteric pain syndrome 07/22/2021   Vertigo 07/08/2021   Hypokalemia 07/08/2021   Trochanteric bursitis of left hip 06/30/2021   Cancer ([place]) 04/11/2021   Lumbar radiculopathy 03/02/2021   Gait instability 03/02/2021   Hip pain 03/02/2021   Situational depression 03/02/2021   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[place]) 02/09/2021   Urinary retention 11/26/2020   Cervical myelopathy ([place]) 11/24/2020   Body mass index (BMI) 25.0-25.9, adult 11/02/2020   Lumbar spondylosis 11/02/2020   Lumbago of lumbar region with sciatica 10/21/2020   Cystoid macular edema of both eyes 07/28/2020   Status post cervical spinal fusion 07/09/2020   History of total bilateral knee replacement 07/08/2020   Aortic atherosclerosis ([place]) 03/05/2020   Emphysema, unspecified ([place]) 03/05/2020   Arthritis  03/04/2020   Diverticulosis 03/03/2020   Internal hemorrhoids 03/03/2020   Weight loss 03/03/2020   Heme positive stool 02/29/2020   Leg cramps 11/13/2019   Tibialis anterior tendon tear, nontraumatic 11/13/2019   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 07/29/2018   CPAP (continuous positive airway pressure) dependence 03/25/2018   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   Erectile dysfunction 06/20/2017   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 03/06/2017   Peripheral neuropathy 06/19/2016   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  Hearing loss 02/02/2016   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   Allergic rhinitis 02/02/2016   Atypical chest pain 11/24/2015   Preventative health care 11/24/2015   Onychomycosis 06/30/2015   Degenerative disc disease, lumbar 06/30/2015   Other allergic rhinitis 02/18/2015   Deviated nasal septum 02/18/2015   RLS (restless legs syndrome) 02/18/2015   GERD (gastroesophageal reflux disease) 09/18/2014   Hyperglycemia 03/03/2014   Rotator cuff tear 07/27/2013   Sleep apnea with use of continuous positive airway pressure (CPAP) 07/15/2013   Carotid stenosis 02/24/2013   Nonspecific abnormal electrocardiogram (ECG) (EKG) 01/06/2013   DJD (degenerative joint disease) of hip 12/24/2012   Lower GI bleed 12/12/2012   Right groin pain 11/29/2012   OSA (obstructive sleep apnea) 11/25/2012   Ventral hernia 07/08/2012   High risk medication use 01/18/2012   Dyspnea 06/12/2011   Hyperlipidemia with target LDL less than 130 12/27/2010   Osteoarthrosis, unspecified whether generalized or localized, pelvic region and thigh 07/25/2010   Osteoarthrosis, hand 06/13/2010   Anemia 12/17/2009   Hypothyroidism 05/13/2009   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   Benign essential hypertension 05/13/2009   Right inguinal hernia 05/13/2009   HIATAL HERNIA 05/13/2009   NEPHROLITHIASIS 05/13/2009   ROSACEA 05/13/2009    PCP: [name], NP    REFERRING PROVIDER: [name], NP   REFERRING DIAG: [phone] (ICD-10-CM) - S/P revision of total hip  PT eval and treat for aggressive left knee ROM, weight bearing as tolerated, balance/gait training. S/p revision of left hip replacement at [facility]. (Dr. Linton Rump). No precautions.  THERAPY DIAG:  Other abnormalities of gait and mobility  Muscle weakness (generalized)  Unsteadiness on feet  Repeated falls  Stiffness of left knee, not elsewhere classified  Other low back pain  RATIONALE FOR EVALUATION AND TREATMENT: Rehabilitation  ONSET DATE: 07/02/22  NEXT MD VISIT: 09/18/22   SUBJECTIVE:                                                                                                                                                                                                         SUBJECTIVE STATEMENT:  Pt had another fall on 09/15/22 when he was trying maneuver his RW out of the bathroom doorway - he suffered several skin tears and lacs requiring stitches on his R elbow but no new orthopedic injuries or head trauma.  PAIN: Are you having pain? No  PERTINENT HISTORY:  Revision of femoral component of L THA on 07/02/22 s/p fall with periprosthetic fracture; Aortic atherosclerosis; carotid stenosis; cervical myelopathy s/p ACDF 06/2020 and posterior cervical fusion 11/2020; peripheral  neuropathy; lumbar DDD/spondylosis; lumbago with sciatica/lumbar radiculopathy; OA; B TKA; BTHA; L greater trochanteric pain syndrome; L RCR 2014; recent CTR surgery - early May; hearing loss; HTN; BPPV/vertigo; B sacroiliitis; skin cancer; COPD/emphysema; sleep apnea with CPAP; GERD; gout; HLD; LE cramps; RLS   PRECAUTIONS: Fall  WEIGHT BEARING RESTRICTIONS: No - FWB on left as of 08/18/22  FALLS:  Has patient fallen in last 6 months? Yes. 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 The proximal femur fracture looks healed as I cannot see the fracture line in any view. Good leg lengths.  Right hip replacement is cementless and looks well-fixed with a little varus of the tibial stem.  No bony lesion or arthritic change or other abnormality except some lumbar disc degeneration   07/21/2022 - ORTHO XR PELVIS 2 OR 3 VW UNILATERAL HIP: FINDINGS: AP and lateral views of left hip were obtained and interpreted.  Status post new left total hip revision with wire fixation appearing in good position. Good limb length. No new fracture, bony lesion, or other abnormal findings.  07/21/2022 - ORTHO XR FEMUR 2 VW UNILATERAL: FINDINGS: 3 views of the left femur were obtained and interpreted. Status post new left total hip revision with wire fixation in good position. Previous left total knee arthroplasty. No new fracture, bony lesion, or other abnormal findings.   07/02/2022 - DG HIP (WITH OR WITHOUT PELVIS) 2-3V LEFT :  FINDINGS: Three fluoroscopic spot views of the hip obtained in the operating room. Cerclage wire fixation of periprosthetic fracture, in improved alignment. Fluoroscopy time 12 seconds. Dose 1.23 mGy. IMPRESSION:  Fluoroscopic spot views of the hip during periprosthetic fracture fixation.   07/01/2022 - XR THIGH/FEMUR LEFT 2V & XR PELVIS LIMITED 1 OR 2 VIEWS AP: FINDINGS: Degenerative changes in the lower lumbar spine. Pelvis is intact. SI joints and symphysis pubis are not displaced. Right total hip arthroplasty with non cemented components. Tip of the arthroplasty is not included within the field of view. Visualized components appear well seated. Left total hip arthroplasty. The left hip demonstrates an acute periprosthetic fracture of the proximal femur with lateral displacement and mild distraction of the distal fracture fragment. No evidence of dislocation of the prosthesis. The midshaft and distal left femur are intact. Postoperative left knee arthroplasty. IMPRESSION: 1. Left hip arthroplasty with periprosthetic fracture of the proximal femur. 2. Incidental note of right hip and left knee arthroplasties.   PATIENT SURVEYS:  LEFS 19 / 80 = 23.8 %  COGNITION: Overall cognitive status: Within functional limits for tasks assessed    SENSATION: Peripheral neuropathy in  B feet    EDEMA:  Mild to moderate edema in distal B LE, L>R  MUSCLE LENGTH: (09/07/22) Hamstrings: mod tight R>L ITB: mod tight B Piriformis: mod/severe tight B Hip flexors:  Quads:  Heelcord:   POSTURE:  rounded shoulders, forward head, flexed trunk , and weight shift right  PALPATION: Increased muscle tension throughout left quads and IT band as well as hamstrings  LOWER EXTREMITY ROM:  Active ROM Right eval Left eval  Hip flexion    Hip extension    Hip abduction    Hip adduction    Hip internal rotation    Hip external rotation    Knee flexion 115 92  Knee extension 0 0  Ankle dorsiflexion  limited  Ankle plantarflexion    Ankle inversion    Ankle eversion     Passive ROM Left eval  Knee flexion 95  (Blank rows = not tested)  LOWER EXTREMITY MMT:  MMT Right Eval* Left Eval*  Hip flexion 3+ 3+   Hip extension 4- 4-  Hip abduction 4 4-  Hip adduction 4 4  Hip internal rotation    Hip external rotation    Knee flexion 4 4  Knee extension 4+ 4+  Ankle dorsiflexion 4+ 4  Ankle plantarflexion    Ankle inversion    Ankle eversion     (Blank rows = not tested, * - tested in sitting)  FUNCTIONAL TESTS:  5 times sit to stand: 14.22 sec (09/07/22) Timed up and go (TUG): 21.28 sec with SPC 10 meter walk test: 19.94 sec with SPC; Gait speed = 1.63 ft/sec Berg Balance Scale: 38/56; 37-45 significant risk for falls (>80%)  (09/07/22) Dynamic Gait Index: 10/24; Scores of 19 or less are predicitve of falls in older community living adults (09/07/22)  PLOF (As of 06/20/22): ABC scale: 730 / 1600 = 45.6% Berg: 48/56; 46-51 moderate fall risk (>50%)  10MWT = 11.50 sec with SPC; 10.06 sec w/o AD Gait speed: 2.85 ft/sec with SPC; 3.26 ft/sec w/o AD DGI: 18/24; Scores of 19 or less are predictive of falls in older community living adults  GAIT: Distance walked: 60 ft Assistive device utilized: Single point cane Level of assistance: SBA Gait pattern: step to pattern, step through pattern, decreased step length- Right, decreased stance time- Left, decreased hip/knee flexion- Right, and decreased hip/knee flexion- Left Comments: Inconsistent step length on right creating a barrier to versus step through pattern.  Notable gait instability with SPC.   TODAY'S TREATMENT:   09/20/22 THERAPEUTIC EXERCISE: to improve flexibility, strength and mobility.  Verbal and tactile cues throughout for technique.  NuStep - L4 x 7 min Seated hip hinge R/L HS stretch x 30 sec Standing R/L ITB stretch - lateral lean onto counter with outer leg tucked behind x 30 sec Standing R/L fwd lunge hip flexor stretch at counter x 30 sec Standing hip flexion march at counter x 10 Standing alt HS curl x 10 Seated RTB alt R/L knee extension LAQ (looped band at ankles) x 10 Seated RTB R/L knee flexion HS curls (looped  band at ankles with stationary foot propped on 9" stool) x 10 Seated hip adduction isometric ball squeeze 10 x 3" Seated hip adduction ball squeeze + alt hip IR x 10  NEUROMUSCULAR RE-EDUCATION: To improve balance, proprioception, coordination, and reduce fall risk. B Side-stepping 10 ft along counter x 4 - difficulty with L hip adduction when stepping to R   09/13/22 THERAPEUTIC EXERCISES Sitting  Hamstring stretch x1  min Heel slide x 10 Gastroc stretch with strap 2x30 sec Hip flexor + quad stretch off EOB 2x30 sec Knee flexion red TB 2x10 Knee ext red TB 2x10 Standing  Side stepping with red TB 2x10 counter support  Gastroc stretch at counter x30 sec  NEUROMUSCULAR RE-ED Marching 2x10 counter support (uncomfortable with 1 UE support, decreased weight shift to L) Backwards walking counter support 3x10' Static standing feet together solid surface  EO 2x30 sec  EC 2x30 sec (1 lob forward) Heel/toe raise 2x10   09/07/22 THERAPEUTIC ACTIVITIES: 5xSTS = 14.22 sec Berg = 38/56; 37-45 significant risk for falls (>80%)  DGI = 10/24; Scores of 19 or less are predicitve of falls in older community living adults.  THERAPEUTIC EXERCISE: to improve flexibility, strength and mobility.  Verbal and tactile cues throughout for technique.  Hooklying HS stretch with strap 2 x 30" bil Supine cross-body ITB stretch with strap 2 x 30" bil Hooklying heel slide with strap assist for increased knee flexion 10 x 5" Gentle L SKTC stretch + L knee flexion over hands on back of thigh x 10   PATIENT EDUCATION:  Education details: PT eval findings, initial HEP, and gait safety with RW   Person educated: Patient Education method: Explanation, Demonstration, Verbal cues, and Handouts Education comprehension: verbalized understanding, returned demonstration, verbal cues required, and needs further education  HOME EXERCISE PROGRAM: Access Code: [email or web address] URL: [email or web address] Date:  09/20/2022 Prepared by: Annie Paras  Exercises - Supine Heel Slide with Strap  - 2 x daily - 7 x weekly - 2 sets - 10 reps - 3 sec hold - Hooklying Single Knee to Chest Stretch  - 2 x daily - 7 x weekly - 3 reps - 30 sec hold - Seated Hamstring Curls with Resistance  - 1 x daily - 7 x weekly - 2 sets - 10 reps - 3 sec hold - Seated Knee Extension with Resistance  - 1 x daily - 7 x weekly - 2 sets - 10 reps - Standing March with Counter Support  - 1 x daily - 7 x weekly - 2 sets - 10 reps - Seated Hamstring Stretch  - 2 x daily - 7 x weekly - 3 reps - 30 sec hold - Standing ITB Stretch  - 2 x daily - 7 x weekly - 3 reps - 30 sec hold - Standing Hip Flexor Stretch  - 2 x daily - 7 x weekly - 2 sets - 10 reps - 30 sec hold   ASSESSMENT:  CLINICAL IMPRESSION: [name] suffered another fall since his last PT while trying to maneuver his RW out of the bathroom - only minor injuries sustained.  He reports difficulty performing some of the supine stretches with the strap due to difficulty reaching to his but to place the strap, therefore provided seated and standing alternatives for hamstring, hip flexor, and ITB stretches.  With supine heel slides he reports using his hand rather than strap to assist with knee flexion, but otherwise is able to perform the exercise.  He continues to demonstrate lateral hip instability particularly noted with sidestepping, therefore strengthening targeting medial/lateral hip stability.  Encouraged patient to continue to use RW at this time rather than cane to reduce risk for future falls.  OBJECTIVE IMPAIRMENTS: Abnormal gait, decreased activity tolerance, decreased balance, decreased coordination, decreased endurance, decreased knowledge of condition, decreased knowledge of use of DME, decreased mobility, difficulty walking, decreased ROM, decreased strength, decreased safety awareness, increased fascial restrictions, impaired  perceived functional ability, impaired  flexibility, impaired sensation, improper body mechanics, postural dysfunction, and pain.   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   REHAB POTENTIAL: Good  CLINICAL DECISION MAKING: Evolving/moderate complexity  EVALUATION COMPLEXITY: Moderate   GOALS: Goals reviewed with patient? Yes  SHORT TERM GOALS: Target date: 10/03/2022   Patient will be independent with initial HEP. Baseline:  Goal status: IN PROGRESS  2.  Complete balance testing and establish additional LTGs as appropriate. Baseline:  Goal status: MET 09/07/22  3.  Patient will demonstrate decreased TUG time to </= 16 sec to decrease risk for falls with transitional mobility Baseline: 21.28 sec Goal status: IN PROGRESS  LONG TERM GOALS: Target date: 10/31/2022   Patient will be independent with advanced/ongoing HEP to improve outcomes and carryover.  Baseline:  Goal status: IN PROGRESS  2.  Patient will report at least 50-75% improvement in low back and Lhip pain to improve QOL. Baseline: 7/10 Goal status: IN PROGRESS  3.  Patient will demonstrate improved L  knee AROM to [facility] to allow for normal gait and stair mechanics. Baseline: AROM 0-92, PROM 0-95 Goal status: IN PROGRESS  4.  Patient will demonstrate improved B LE strength to >/= 4 to 4+/5 for improved stability and ease of mobility. Baseline:  Goal status: IN PROGRESS  5.  Patient will be able to ambulate 600' with LRAD and normal gait pattern, good stability and without increased pain to safely access community.  Baseline:  Goal status: IN PROGRESS  6. Patient will be able to ascend/descend stairs with 1 HR and reciprocal step pattern safely to access home and community.  Baseline:  Goal status: IN PROGRESS  7.  Patient will report >/=28/80 on LEFS (patient reported outcome measure) to demonstrate improved functional ability. Baseline: 19 / 80 = 23.8 % Goal status: IN PROGRESS  8.  Patient will demonstrate at least 19/24 on DGI to decrease risk of falls. Baseline: 10/24 (09/07/22) Goal status: IN PROGRESS   9.  Patient will improve Berg score by at least 8 points to improve safety and stability with ADLs in standing and reduce risk for falls Baseline: 38/56 (09/07/22) Goal status: IN PROGRESS   PLAN:  PT FREQUENCY: 2x/week  PT DURATION: 8 weeks  PLANNED INTERVENTIONS: Therapeutic exercises, Therapeutic activity, Neuromuscular re-education, Balance training, Gait training, Patient/Family education, Self Care, Joint mobilization, Stair training, DME instructions, Dry Needling, Electrical stimulation, Cryotherapy, Moist heat, scar mobilization, Taping, Ultrasound, Ionotophoresis '4mg'$ /ml Dexamethasone, Manual therapy, and Re-evaluation  PLAN FOR NEXT SESSION: gait with rolling walker, weaning to his SPC as appropriate; review and update initial HEP - L knee ROM, LE flexibility and strengthening   Percival Spanish, PT 09/20/2022, 12:52 PM

## 2022-09-22 ENCOUNTER — Ambulatory Visit (INDEPENDENT_AMBULATORY_CARE_PROVIDER_SITE_OTHER): Payer: Medicare HMO | Admitting: Family

## 2022-09-22 ENCOUNTER — Ambulatory Visit: Payer: Medicare HMO | Admitting: Family

## 2022-09-22 VITALS — BP 155/83 | HR 69 | Temp 98.7°F | Resp 16

## 2022-09-22 DIAGNOSIS — S51811D Laceration without foreign body of right forearm, subsequent encounter: Secondary | ICD-10-CM | POA: Diagnosis not present

## 2022-09-22 MED ORDER — AMLODIPINE BESYLATE 5 MG PO TABS: 7.5000 mg | ORAL_TABLET | Freq: Every day | ORAL | 1 refills | Status: DC

## 2022-09-22 NOTE — Patient Instructions (Signed)
Please increase your amlodipine from '5mg'$  to 7.'5mg'$ .

## 2022-09-22 NOTE — Progress Notes (Unsigned)
Subjective:     Patient ID: [name] Pratt, male    DOB: 06-Jan-1941, 82 y.o.   MRN: [id number]  Chief Complaint  Patient presents with   Suture / Staple Removal    Patient here for suture removal    HPI Patient is in today for suture removal.     Health Maintenance Due  Topic Date Due   FOOT EXAM  09/29/2021   COVID-19 Vaccine (5 - 2023-24 season) 05/12/2022    Past Medical History:  Diagnosis Date   Allergic rhinitis 02/02/2016   Anemia 12/17/2009   Formatting of this note might be different from the original. Anemia  10/1 IMO update   Aortic atherosclerosis ([place]) 03/05/2020   Arthritis    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   Atypical chest pain 11/24/2015   Benign essential hypertension 05/13/2009   Qualifier: Diagnosis of  By: Larwance Sachs of this note might be different from the original. Hypertension   Benign paroxysmal positional vertigo 09/14/2021   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[place]) 07/22/2021   Body mass index (BMI) 25.0-25.9, adult 11/02/2020   Cancer ([place])    skin cancer   Cardiac murmur 07/25/2021   Carotid stenosis    a. Carotid U/S 1/06: LICA < 26%, RICA 94-85%;  b.  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   Cystoid macular edema of both eyes 07/28/2020   Degenerative disc disease, lumbar 06/30/2015   Deviated nasal septum 02/18/2015   Diverticulosis 03/03/2020   DJD (degenerative joint disease) of hip 12/24/2012   Dyspnea 06/12/2011   Emphysema, unspecified ([place]) 03/05/2020   Erectile dysfunction 06/20/2017   Exudative age-related macular degeneration of right eye  with active choroidal neovascularization ([place]) 02/09/2021   Gait disorder 03/02/2021   GERD (gastroesophageal reflux disease)    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   Qualifier: Diagnosis of  By: Burnett Kanaris     Greater trochanteric pain syndrome 07/22/2021   Hand pain, left 01/16/2022   Hearing loss 02/02/2016   Heme positive stool 02/29/2020   HIATAL HERNIA 05/13/2009   Qualifier: Diagnosis of  By: Burnett Kanaris     High risk medication use 01/18/2012   Hip pain 03/02/2021   History of chicken pox    History of COVID-19 12/05/2021   History of hemorrhoids    History of kidney stones    History of skin cancer 07/22/2021   skin cancer   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 Cardiology - Dr Frances Nickels at [facility] cardiology  ICD-10 cut over   Hypertension    under control   Hypokalemia 07/08/2021   Hypothyroidism    Internal hemorrhoids 03/03/2020   Leg cramps 11/13/2019   Lower GI bleed 12/12/2012   Lumbago of lumbar region with sciatica 10/21/2020   Lumbar radiculopathy 03/02/2021   Lumbar spondylosis 11/02/2020   NEPHROLITHIASIS 05/13/2009   Qualifier: Diagnosis of  By: Burnett Kanaris     Nonspecific abnormal electrocardiogram (ECG) (EKG) 01/06/2013   Numbness of hand 01/24/2022  Onychomycosis 06/30/2015   Osteoarthrosis, 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 Osteoarthritis Of The Hand  10/1 IMO update   Osteoarthrosis, unspecified whether generalized or localized, pelvic region and thigh 07/25/2010   Formatting of this note might be different from the original. Osteoarthritis Of The Hip   Other abnormal glucose 07/22/2021   Other allergic rhinitis 02/18/2015   Palpitations 07/25/2021   Peripheral neuropathy 06/19/2016   Preventative health care 11/24/2015   Right groin pain  11/29/2012   Right inguinal hernia 05/13/2009   Qualifier: Diagnosis of  By: Burnett Kanaris     RLS (restless legs syndrome) 02/18/2015   Rosacea    Rotator cuff tear 07/27/2013   Situational depression 03/02/2021   Skin lesion 01/16/2022   Sleep apnea with use of continuous positive airway pressure (CPAP) 07/15/2013   CPAP set to  7 cm water,  Residual AHi was 7.8  With no leak.  User time 6 hours.  479 days , not used only 12 days - highly compliant 06-23-13  .    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   Urinary retention 11/26/2020   Ventral hernia 07/08/2012   Vertigo 07/08/2021   Weight loss 03/03/2020    Past Surgical History:  Procedure Laterality Date   ABDOMINAL HERNIA REPAIR  07/15/12   Dr [name]   ANTERIOR CERVICAL DECOMP/DISCECTOMY FUSION N/A 07/09/2020   Procedure: ANTERIOR CERVICAL DECOMPRESSION AND FUSION CERVICAL THREE-FOUR.;  Surgeon: [name], MD;  Location: [place];  Service: Neurosurgery;  Laterality: N/A;  anterior   BROW LIFT  05/07/01   COLONOSCOPY WITH PROPOFOL N/A 04/24/2022   Procedure: COLONOSCOPY WITH PROPOFOL;  Surgeon: Thornton Park, MD;  Location: [place];  Service: Gastroenterology;  Laterality: N/A;   COLONOSCOPY WITH PROPOFOL N/A 04/26/2022   Procedure: COLONOSCOPY WITH PROPOFOL;  Surgeon: Thornton Park, MD;  Location: [place];  Service: Gastroenterology;  Laterality: N/A;   CYSTOSCOPY WITH URETEROSCOPY AND STENT PLACEMENT Right 01/28/2014   Procedure: CYSTOSCOPY WITH URETEROSCOPY, BASKET RETRIVAL AND  STENT PLACEMENT;  Surgeon: [name], MD;  Location: [facility];  Service: Urology;  Laterality: Right;    epidural injections     multiple procedures   ESOPHAGEAL DILATION  1993 and 1994   multiple times   EYE SURGERY Bilateral 03/25/10, 2012   cataract removal   HEMORRHOID SURGERY  2014   HIATAL HERNIA REPAIR  12/21/93   HOLMIUM LASER APPLICATION Right 1/61/0960   Procedure: HOLMIUM LASER APPLICATION;  Surgeon: [name], MD;  Location: [facility];  Service: Urology;  Laterality: Right;   INGUINAL HERNIA REPAIR Right 07/15/12   Dr [name], x2   JOINT REPLACEMENT  07/25/04   right knee   JOINT REPLACEMENT  07/13/10   left knee   [place]  09/20/06   with intraoperative cholangiogram and right inguinal herniorrhaphy with mesh    LIGAMENT REPAIR Left 07/2013   shoulder   LITHOTRIPSY     x2   [place]   POLYPECTOMY  04/26/2022   Procedure: POLYPECTOMY;  Surgeon: Thornton Park, MD;  Location: [place];  Service: Gastroenterology;;  POSTERIOR CERVICAL FUSION/FORAMINOTOMY N/A 11/24/2020   Procedure: Posterior Cervical Laminectomy Cervical three-four, Cervical four-five with lateral mass fusion;  Surgeon: [name], MD;  Location: [place];  Service: Neurosurgery;  Laterality: N/A;   REFRACTIVE SURGERY Bilateral    Right total hip replacement  4/14   SKIN CANCER DESTRUCTION     nose, ear   TONSILLECTOMY  as child   TOTAL HIP ARTHROPLASTY Left    URETHRAL DILATION  1991   [name]    Family History  Problem Relation Age of Onset   Cancer Mother    Hypertension Other    Cancer Other    Osteoarthritis Other    Stomach cancer Neg Hx    Colon cancer Neg Hx    Esophageal cancer Neg Hx     Social History   Socioeconomic History   Marital status: Married    Spouse name: Not on file   Number of children: 1   Years of education: Not on file   Highest education level: Not on file  Occupational History   Occupation: firefighter    Comment: paints on the side   Occupation: retired  Tobacco Use   Smoking status:  Former    Years: 11.00    Types: Cigarettes    Quit date: 09/11/1978    Years since quitting: 44.0   Smokeless tobacco: Never  Vaping Use   Vaping Use: Never used  Substance and Sexual Activity   Alcohol use: Not Currently    Alcohol/week: 0.0 standard drinks of alcohol    Comment: rare   Drug use: No   Sexual activity: Not Currently  Other Topics Concern   Not on file  Social History Narrative   Lives with his wife   He has one son- lives locally.   2 grandchildren   Has worked for Research officer, trade union   Enjoys wood working   Completed HS.  [employer]   Social Determinants of Health   Financial Resource Strain: Low Risk  (10/28/2021)   Overall Financial Resource Strain (CARDIA)    Difficulty of Paying Living Expenses: Not very hard  Food Insecurity: No Food Insecurity (06/05/2022)   Hunger Vital Sign    Worried About Running Out of Food in the Last Year: Never true    Ran Out of Food in the Last Year: Never true  Transportation Needs: Unknown (06/05/2022)   PRAPARE - Hydrologist (Medical): No    Lack of Transportation (Non-Medical): Not on file  Physical Activity: Insufficiently Active (10/28/2021)   Exercise Vital Sign    Days of Exercise per Week: 2 days    Minutes of Exercise per Session: 30 min  Stress: No Stress Concern Present (01/19/2022)   [place]    Feeling of Stress : Only a little  Social Connections: Moderately Isolated (01/19/2022)   Social Connection and Isolation Panel [NHANES]    Frequency of Communication with Friends and Family: More than three times a week    Frequency of Social Gatherings with Friends and Family: More than three times a week    Attends Religious Services: Never    Marine scientist or Organizations: No    Attends Archivist Meetings: Never    Marital Status: Married  Human resources officer Violence: Not At Risk (01/19/2022)    Humiliation, Afraid, Rape, and Kick questionnaire    Fear of Current or Ex-Partner: No    Emotionally Abused: No  Physically Abused: No    Sexually Abused: No    Outpatient Medications Prior to Visit  Medication Sig Dispense Refill   acetaminophen (TYLENOL) 650 MG CR tablet Take 650-1,300 mg by mouth every 8 (eight) hours as needed for pain.     albuterol (VENTOLIN HFA) 108 (90 Base) MCG/ACT inhaler INHALE 2 PUFFS INTO THE LUNGS EVERY 6 HOURS AS NEEDED FOR SHORTNESS OF BREATH. (Patient taking differently: Inhale 2 puffs into the lungs every 6 (six) hours as needed for shortness of breath.) 1 each 3   allopurinol (ZYLOPRIM) 100 MG tablet Take 1 tablet (100 mg total) by mouth 2 (two) times daily. 180 tablet 1   antiseptic oral rinse (BIOTENE) LIQD 15 mLs by Mouth Rinse route in the morning.     aspirin EC 81 MG tablet Take 81 mg by mouth at bedtime.     bacitracin ointment Apply 1 Application topically 2 (two) times daily. 120 g 0   cycloSPORINE (RESTASIS) 0.05 % ophthalmic emulsion Place 1 drop into both eyes 2 (two) times daily.     diclofenac Sodium (VOLTAREN) 1 % GEL Apply 2 g topically daily as needed (for arthritic pain in the hands).     docusate sodium (COLACE) 100 MG capsule Take 1 capsule (100 mg total) by mouth 2 (two) times daily. 60 capsule 11   gabapentin (NEURONTIN) 300 MG capsule Take 1 capsule by mouth at bedtime.     GEMTESA 75 MG TABS Take 75 mg by mouth daily.     glucose blood test strip TRUE Metrix Blood Glucose Test Strip, use as instructed 100 each 12   Homeopathic Products (THERAWORX RELIEF) FOAM Apply 1 application topically 2 (two) times daily as needed (Pain).     hydrALAZINE (APRESOLINE) 50 MG tablet Take 1 tablet (50 mg total) by mouth 2 (two) times daily. 180 tablet 1   Iron, Ferrous Sulfate, 325 (65 Fe) MG TABS Take 325 mg by mouth every other day. 30 tablet 0   Lancets MISC 1 each by Does not apply route 2 (two) times daily. TRUE matrix lancets 100 each 3    levothyroxine (SYNTHROID) 150 MCG tablet Take 150 mcg 6 days a week (patient takes 75 mcg once a week) Disp #84 for 90 day supply 84 tablet 1   levothyroxine (SYNTHROID) 75 MCG tablet Take 75 mcg once a week (patient on 150 mcg 6 days a week) Dispense #24 for 90 day supply. 12 tablet 1   loratadine (CLARITIN) 10 MG tablet Take 10 mg by mouth daily as needed for allergies or rhinitis.     losartan (COZAAR) 100 MG tablet TAKE 1 TABLET EVERY DAY 90 tablet 1   Magnesium 500 MG TABS Take 500 mg by mouth daily.     Multiple Vitamins-Minerals (MULTIVITAMIN WITH MINERALS) tablet Take 1 tablet by mouth daily.     nitroGLYCERIN (NITROSTAT) 0.4 MG SL tablet Place 1 tablet (0.4 mg total) under the tongue every 5 (five) minutes as needed for chest pain. 25 tablet 6   polyethylene glycol (MIRALAX / GLYCOLAX) 17 g packet Take 17-34 g by mouth daily as needed for mild constipation.     polyvinyl alcohol (ARTIFICIAL TEARS) 1.4 % ophthalmic solution Place 1 drop into both eyes as needed for dry eyes.     PRESCRIPTION MEDICATION CPAP- At bedtime and during any time of rest     Probiotic Product (PROBIOTIC DAILY PO) Take 1 capsule by mouth daily.     rosuvastatin (CRESTOR) 5 MG tablet  Take 1 tablet (5 mg total) by mouth daily. 90 tablet 1   sodium chloride (OCEAN) 0.65 % nasal spray Place 1 spray into both nostrils as needed for congestion.     traMADol (ULTRAM) 50 MG tablet Take 1 tablet (50 mg total) by mouth every 12 (twelve) hours as needed. 10 tablet 0   vitamin B-12 (CYANOCOBALAMIN) 500 MCG tablet Take 500 mcg by mouth daily.     amLODipine (NORVASC) 5 MG tablet Take 1 tablet (5 mg total) by mouth daily. 90 tablet 1   ondansetron (ZOFRAN-ODT) 4 MG disintegrating tablet Take 1 tablet (4 mg total) by mouth every 8 (eight) hours as needed for nausea or vomiting. (Patient not taking: Reported on 09/05/2022) 10 tablet 0   No facility-administered medications prior to visit.    Allergies  Allergen Reactions    Pravastatin Other (See Comments)    Muscle cramps   Sildenafil Other (See Comments)    Tachycardia     Oxycodone Other (See Comments)    Hallucinations    Atenolol Other (See Comments)    Bradycardia    Elastic Bandages & [Zinc] Other (See Comments)    Teflon bandages - Irritation   Metoclopramide Hcl Anxiety    ROS See HPI     Objective:    Physical Exam HENT:     Head: Normocephalic.  Pulmonary:     Effort: Pulmonary effort is normal.  Skin:    General: Skin is warm and dry.     Comments: Laceration right upper forearm is healing well.   Neurological:     Mental Status: He is alert.     BP (!) 155/83   Pulse 69   Temp 98.7 F (37.1 C) (Oral)   Resp 16   SpO2 100%  Wt Readings from Last 3 Encounters:  09/18/22 156 lb (70.8 kg)  09/15/22 153 lb (69.4 kg)  08/28/22 149 lb (67.6 kg)       Assessment & Plan:   Problem List Items Addressed This Visit       Unprioritized   Skin tear of forearm without complication, right, subsequent encounter - Primary    Sutures successfully removed.  Pt tolerated procedure.  Wound was cleansed redressed.       15 minutes spent on today's visit. Time was spent on suture removal and wound care.   I have discontinued Lucan B. Cowing "[name]"'s ondansetron. I have also changed his amLODipine. Additionally, I am having him maintain his cycloSPORINE, sodium chloride, aspirin EC, acetaminophen, Theraworx Relief, glucose blood, Lancets, diclofenac Sodium, polyethylene glycol, nitroGLYCERIN, Magnesium, albuterol, Gemtesa, traMADol, PRESCRIPTION MEDICATION, vitamin B-12, loratadine, antiseptic oral rinse, docusate sodium, polyvinyl alcohol, Probiotic Product (PROBIOTIC DAILY PO), multivitamin with minerals, allopurinol, hydrALAZINE, levothyroxine, rosuvastatin, levothyroxine, losartan, Iron (Ferrous Sulfate), gabapentin, and bacitracin.  Meds ordered this encounter  Medications   amLODipine (NORVASC) 5 MG tablet    Sig: Take  1.5 tablets (7.5 mg total) by mouth daily.    Dispense:  90 tablet    Refill:  1    Order Specific Question:   Supervising Provider    Answer:   Penni Homans A [[id number]]

## 2022-09-24 NOTE — Assessment & Plan Note (Signed)
Sutures successfully removed.  Pt tolerated procedure.  Wound was cleansed redressed.

## 2022-09-26 ENCOUNTER — Encounter: Payer: Self-pay | Admitting: Physical Therapy

## 2022-09-26 ENCOUNTER — Ambulatory Visit: Payer: Medicare HMO | Admitting: Physical Therapy

## 2022-09-26 DIAGNOSIS — M6281 Muscle weakness (generalized): Secondary | ICD-10-CM | POA: Diagnosis not present

## 2022-09-26 DIAGNOSIS — R2681 Unsteadiness on feet: Secondary | ICD-10-CM

## 2022-09-26 DIAGNOSIS — R296 Repeated falls: Secondary | ICD-10-CM | POA: Diagnosis not present

## 2022-09-26 DIAGNOSIS — R2689 Other abnormalities of gait and mobility: Secondary | ICD-10-CM

## 2022-09-26 DIAGNOSIS — M25662 Stiffness of left knee, not elsewhere classified: Secondary | ICD-10-CM

## 2022-09-26 DIAGNOSIS — M5459 Other low back pain: Secondary | ICD-10-CM | POA: Diagnosis not present

## 2022-09-26 NOTE — Therapy (Signed)
OUTPATIENT PHYSICAL THERAPY TREATMENT   Patient Name: [name] Pratt MRN: [id number] DOB:[birth year], 82 y.o., male Today's Date: 09/26/2022   END OF SESSION:  PT End of Session - 09/26/22 1255     Visit Number 5    Number of Visits 27    Date for PT Re-Evaluation 10/31/22    Authorization Type [name]    Authorization Time Period 09/05/22-10/31/22    Authorization - Number of Visits 10    Progress Note Due on Visit 23    PT Start Time 1300    PT Stop Time 1348    PT Time Calculation (min) 48 min    Activity Tolerance Patient tolerated treatment well    Behavior During Therapy [facility] for tasks assessed/performed               Past Medical History:  Diagnosis Date   Allergic rhinitis 02/02/2016   Anemia 12/17/2009   Formatting of this note might be different from the original. Anemia  10/1 IMO update   Aortic atherosclerosis ([place]) 03/05/2020   Arthritis    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   Atypical chest pain 11/24/2015   Benign essential hypertension 05/13/2009   Qualifier: Diagnosis of  By: Larwance Sachs of this note might be different from the original. Hypertension   Benign paroxysmal positional vertigo 09/14/2021   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[place]) 07/22/2021   Body mass index (BMI) 25.0-25.9, adult 11/02/2020   Cancer ([place])    skin cancer   Cardiac murmur 07/25/2021   Carotid stenosis    a. Carotid U/S 5/62: LICA < 56%, RICA 38-93%;  b.  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   Cystoid macular edema of both eyes 07/28/2020   Degenerative disc disease, lumbar  06/30/2015   Deviated nasal septum 02/18/2015   Diverticulosis 03/03/2020   DJD (degenerative joint disease) of hip 12/24/2012   Dyspnea 06/12/2011   Emphysema, unspecified ([place]) 03/05/2020   Erectile dysfunction 06/20/2017   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[place]) 02/09/2021   Gait disorder 03/02/2021   GERD (gastroesophageal reflux disease)    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   Qualifier: Diagnosis of  By: Burnett Kanaris     Greater trochanteric pain syndrome 07/22/2021   Hand pain, left 01/16/2022   Hearing loss 02/02/2016   Heme positive stool 02/29/2020   HIATAL HERNIA 05/13/2009   Qualifier: Diagnosis of  By: Burnett Kanaris     High risk medication use 01/18/2012   Hip pain 03/02/2021   History of chicken pox    History of COVID-19 12/05/2021   History of hemorrhoids    History of kidney stones    History of skin cancer 07/22/2021   skin cancer   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 Cardiology - Dr Frances Nickels at [facility] cardiology  ICD-10 cut over   Hypertension    under control   Hypokalemia 07/08/2021   Hypothyroidism    Internal hemorrhoids 03/03/2020  Leg cramps 11/13/2019   Lower GI bleed 12/12/2012   Lumbago of lumbar region with sciatica 10/21/2020   Lumbar radiculopathy 03/02/2021   Lumbar spondylosis 11/02/2020   NEPHROLITHIASIS 05/13/2009   Qualifier: Diagnosis of  By: Burnett Kanaris     Nonspecific abnormal electrocardiogram (ECG) (EKG) 01/06/2013   Numbness of hand 01/24/2022   Onychomycosis 06/30/2015   Osteoarthrosis, 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 Osteoarthritis Of The Hand  10/1 IMO update   Osteoarthrosis, unspecified whether generalized or localized, pelvic region and thigh  07/25/2010   Formatting of this note might be different from the original. Osteoarthritis Of The Hip   Other abnormal glucose 07/22/2021   Other allergic rhinitis 02/18/2015   Palpitations 07/25/2021   Peripheral neuropathy 06/19/2016   Preventative health care 11/24/2015   Right groin pain 11/29/2012   Right inguinal hernia 05/13/2009   Qualifier: Diagnosis of  By: Burnett Kanaris     RLS (restless legs syndrome) 02/18/2015   Rosacea    Rotator cuff tear 07/27/2013   Situational depression 03/02/2021   Skin lesion 01/16/2022   Sleep apnea with use of continuous positive airway pressure (CPAP) 07/15/2013   CPAP set to  7 cm water,  Residual AHi was 7.8  With no leak.  User time 6 hours.  479 days , not used only 12 days - highly compliant 06-23-13  .    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   Urinary retention 11/26/2020   Ventral hernia 07/08/2012   Vertigo 07/08/2021   Weight loss 03/03/2020   Past Surgical History:  Procedure Laterality Date   ABDOMINAL HERNIA REPAIR  07/15/12   Dr [name]   ANTERIOR CERVICAL DECOMP/DISCECTOMY FUSION N/A 07/09/2020   Procedure: ANTERIOR CERVICAL DECOMPRESSION AND FUSION CERVICAL THREE-FOUR.;  Surgeon: [name], MD;  Location: [place];  Service: Neurosurgery;  Laterality: N/A;  anterior   BROW LIFT  05/07/01   COLONOSCOPY WITH PROPOFOL N/A 04/24/2022   Procedure: COLONOSCOPY WITH PROPOFOL;  Surgeon: Thornton Park, MD;  Location: [place];  Service: Gastroenterology;  Laterality: N/A;   COLONOSCOPY WITH PROPOFOL N/A 04/26/2022   Procedure: COLONOSCOPY WITH PROPOFOL;  Surgeon: Thornton Park, MD;  Location: [place];  Service: Gastroenterology;  Laterality: N/A;   CYSTOSCOPY WITH URETEROSCOPY AND STENT PLACEMENT Right 01/28/2014   Procedure: CYSTOSCOPY WITH URETEROSCOPY, BASKET RETRIVAL AND  STENT PLACEMENT;  Surgeon: [name], MD;  Location: [facility];  Service: Urology;  Laterality: Right;   epidural injections     multiple procedures   ESOPHAGEAL DILATION  1993 and 1994   multiple times   EYE SURGERY Bilateral 03/25/10, 2012   cataract removal   HEMORRHOID SURGERY  2014   HIATAL HERNIA REPAIR  12/21/93   HOLMIUM LASER APPLICATION Right 12/18/8117   Procedure: HOLMIUM LASER APPLICATION;  Surgeon: [name], MD;  Location: [facility];  Service: Urology;  Laterality: Right;   INGUINAL HERNIA REPAIR Right 07/15/12   Dr [name], x2   JOINT REPLACEMENT  07/25/04   right knee   JOINT REPLACEMENT  07/13/10   left knee   [place]  09/20/06   with intraoperative cholangiogram and right inguinal herniorrhaphy with mesh    LIGAMENT REPAIR Left 07/2013   shoulder   LITHOTRIPSY     x2   NASAL SEPTUM SURGERY  1975   POLYPECTOMY  04/26/2022   Procedure: POLYPECTOMY;  Surgeon: Thornton Park, MD;  Location: [place];  Service: Gastroenterology;;   POSTERIOR CERVICAL FUSION/FORAMINOTOMY N/A 11/24/2020   Procedure: Posterior Cervical Laminectomy Cervical three-four, Cervical four-five with lateral mass fusion;  Surgeon: [name], MD;  Location: [place];  Service: Neurosurgery;  Laterality: N/A;   REFRACTIVE SURGERY Bilateral    Right total hip replacement  4/14   SKIN CANCER DESTRUCTION     nose, ear   TONSILLECTOMY  as child   TOTAL HIP ARTHROPLASTY Left    URETHRAL DILATION  1991   [name]   Patient Active Problem List   Diagnosis Date Noted   Skin tear of right elbow without complication 78/29/5621   Skin tear of forearm without complication, right, subsequent encounter 09/18/2022   Fall 09/18/2022   Iron deficiency anemia 09/18/2022   S/P  revision of total hip 08/28/2022   Vitamin D deficiency 08/07/2022   Closed fracture of left hip ([place]) 08/07/2022   Hand weakness 05/02/2022   Acute kidney injury ([place]) 05/02/2022   Adenomatous polyp of ascending colon    Adenomatous polyp of colon    Acute metabolic encephalopathy 30/86/5784   GI bleed 04/21/2022   Vasovagal syncope    Hemorrhagic shock (HCC)    Hematochezia    Irregular heart beat 02/03/2022   Constipation 02/03/2022   Carpal tunnel syndrome 01/24/2022   Numbness of hand 01/24/2022   Ulnar neuropathy 01/24/2022   Skin lesion 01/16/2022   History of COVID-19 12/05/2021   Benign paroxysmal positional vertigo 09/14/2021   Palpitations 07/25/2021   Cardiac murmur 07/25/2021   History of chicken pox 07/22/2021   History of hemorrhoids 07/22/2021   Hypertension 07/22/2021   COPD (chronic obstructive pulmonary disease) ([place]) 07/22/2021   History of kidney stones 07/22/2021   History of skin cancer 69/62/9528   Complication of anesthesia 07/22/2021   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[place]) 07/22/2021   Other abnormal glucose 07/22/2021   Spondylitis ([place]) 07/22/2021   Greater trochanteric pain syndrome 07/22/2021   Vertigo 07/08/2021   Hypokalemia 07/08/2021   Trochanteric bursitis of left hip 06/30/2021   Cancer ([place]) 04/11/2021   Lumbar radiculopathy 03/02/2021   Gait instability 03/02/2021   Hip pain 03/02/2021   Situational depression 03/02/2021   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[place]) 02/09/2021   Urinary retention 11/26/2020   Cervical myelopathy ([place]) 11/24/2020   Body mass index (BMI) 25.0-25.9, adult 11/02/2020   Lumbar spondylosis 11/02/2020   Lumbago of lumbar region with sciatica 10/21/2020   Cystoid macular edema of both eyes 07/28/2020   Status post cervical spinal fusion 07/09/2020   History of total bilateral knee replacement 07/08/2020   Aortic atherosclerosis ([place]) 03/05/2020    Emphysema, unspecified ([place]) 03/05/2020   Arthritis 03/04/2020   Diverticulosis 03/03/2020   Internal hemorrhoids 03/03/2020   Weight loss 03/03/2020   Heme positive stool 02/29/2020   Leg cramps 11/13/2019   Tibialis anterior tendon tear, nontraumatic 11/13/2019   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 07/29/2018   CPAP (continuous positive airway pressure) dependence 03/25/2018   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   Erectile dysfunction 06/20/2017   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 03/06/2017   Peripheral neuropathy 06/19/2016  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   Hearing loss 02/02/2016   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   Allergic rhinitis 02/02/2016   Atypical chest pain 11/24/2015   Preventative health care 11/24/2015   Onychomycosis 06/30/2015   Degenerative disc disease, lumbar 06/30/2015   Other allergic rhinitis 02/18/2015   Deviated nasal septum 02/18/2015   RLS (restless legs syndrome) 02/18/2015   GERD (gastroesophageal reflux disease) 09/18/2014   Hyperglycemia 03/03/2014   Rotator cuff tear 07/27/2013   Sleep apnea with use of continuous positive airway pressure (CPAP) 07/15/2013   Carotid stenosis 02/24/2013   Nonspecific abnormal electrocardiogram (ECG) (EKG) 01/06/2013   DJD (degenerative joint disease) of hip 12/24/2012   Lower GI bleed 12/12/2012   Right groin pain 11/29/2012   OSA (obstructive sleep apnea) 11/25/2012   Ventral hernia 07/08/2012   High risk medication use 01/18/2012   Dyspnea 06/12/2011   Hyperlipidemia with target LDL less than 130 12/27/2010   Osteoarthrosis, unspecified whether generalized or localized, pelvic region and thigh 07/25/2010   Osteoarthrosis, hand 06/13/2010   Anemia 12/17/2009   Hypothyroidism 05/13/2009   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   Benign essential hypertension 05/13/2009   Right inguinal hernia 05/13/2009   HIATAL HERNIA 05/13/2009   NEPHROLITHIASIS 05/13/2009    ROSACEA 05/13/2009    PCP: [name], NP   REFERRING PROVIDER: [name], NP   REFERRING DIAG: [phone] (ICD-10-CM) - S/P revision of total hip  PT eval and treat for aggressive left knee ROM, weight bearing as tolerated, balance/gait training. S/p revision of left hip replacement at [facility]. (Dr. Linton Rump). No precautions.  THERAPY DIAG:  Other abnormalities of gait and mobility  Muscle weakness (generalized)  Unsteadiness on feet  Repeated falls  Stiffness of left knee, not elsewhere classified  Other low back pain  RATIONALE FOR EVALUATION AND TREATMENT: Rehabilitation  ONSET DATE: 07/02/22  NEXT MD VISIT: 09/18/22   SUBJECTIVE:                                                                                                                                                                                                         SUBJECTIVE STATEMENT: Having a lot of pain in the left hip and back  PAIN: PAIN:  Are you having pain? Yes: NPRS scale: 5/10 Pain location: left low back and hip Pain description: ache Aggravating factors: movement Relieving factors: rest     PERTINENT HISTORY:  Revision of femoral component of L THA on 07/02/22 s/p fall with periprosthetic fracture; Aortic atherosclerosis; carotid stenosis; cervical myelopathy s/p ACDF 06/2020 and posterior cervical fusion  11/2020; peripheral neuropathy; lumbar DDD/spondylosis; lumbago with sciatica/lumbar radiculopathy; OA; B TKA; BTHA; L greater trochanteric pain syndrome; L RCR 2014; recent CTR surgery - early May; hearing loss; HTN; BPPV/vertigo; B sacroiliitis; skin cancer; COPD/emphysema; sleep apnea with CPAP; GERD; gout; HLD; LE cramps; RLS   PRECAUTIONS: Fall  WEIGHT BEARING RESTRICTIONS: No - FWB on left as of 08/18/22  FALLS:  Has patient fallen in last 6 months? Yes. 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 The proximal femur fracture looks healed as I cannot see the fracture line in any view. Good leg lengths.  Right hip replacement is cementless and looks well-fixed with a little varus of the tibial stem.  No bony lesion or arthritic change or other abnormality except some lumbar disc degeneration   07/21/2022 - ORTHO XR PELVIS 2 OR 3 VW UNILATERAL HIP: FINDINGS: AP and lateral views of left hip were obtained and interpreted.  Status post new left total hip revision with wire fixation appearing in good position. Good limb length. No new fracture, bony lesion, or other abnormal findings.  07/21/2022 - ORTHO XR FEMUR 2 VW UNILATERAL: FINDINGS: 3 views of the left femur were obtained and interpreted. Status post new left total hip revision with wire fixation in good position. Previous left total knee arthroplasty. No new fracture, bony lesion, or other abnormal findings.   07/02/2022 - DG HIP (WITH OR WITHOUT PELVIS) 2-3V LEFT :  FINDINGS: Three fluoroscopic spot views of the hip obtained in the operating room. Cerclage wire fixation of periprosthetic fracture, in improved alignment. Fluoroscopy time 12 seconds. Dose 1.23  mGy. IMPRESSION: Fluoroscopic spot views of the hip during periprosthetic fracture fixation.   07/01/2022 - XR THIGH/FEMUR LEFT 2V & XR PELVIS LIMITED 1 OR 2 VIEWS AP: FINDINGS: Degenerative changes in the lower lumbar spine. Pelvis is intact. SI joints and symphysis pubis are not displaced. Right total hip arthroplasty with non cemented components. Tip of the arthroplasty is not included within the field of view. Visualized components appear well seated. Left total hip arthroplasty. The left hip demonstrates an acute periprosthetic fracture of the proximal femur with lateral displacement and mild distraction of the distal fracture fragment. No evidence of dislocation of the prosthesis. The midshaft and distal left femur are intact. Postoperative left knee arthroplasty. IMPRESSION: 1. Left hip arthroplasty with periprosthetic fracture of the proximal femur. 2. Incidental note of right hip and left knee arthroplasties.   PATIENT SURVEYS:  LEFS 19 / 80 = 23.8 %  COGNITION: Overall cognitive status: Within functional limits for tasks assessed    SENSATION: Peripheral  neuropathy in B feet    EDEMA:  Mild to moderate edema in distal B LE, L>R  MUSCLE LENGTH: (09/07/22) Hamstrings: mod tight R>L ITB: mod tight B Piriformis: mod/severe tight B Hip flexors:  Quads:  Heelcord:   POSTURE:  rounded shoulders, forward head, flexed trunk , and weight shift right  PALPATION: Increased muscle tension throughout left quads and IT band as well as hamstrings  LOWER EXTREMITY ROM:  Active ROM Right eval Left eval  Hip flexion    Hip extension    Hip abduction    Hip adduction    Hip internal rotation    Hip external rotation    Knee flexion 115 92  Knee extension 0 0  Ankle dorsiflexion  limited  Ankle plantarflexion    Ankle inversion    Ankle eversion     Passive ROM Left eval  Knee flexion 95  (Blank rows = not tested)  LOWER EXTREMITY MMT:  MMT Right Eval* Left Eval*  Hip  flexion 3+ 3+  Hip extension 4- 4-  Hip abduction 4 4-  Hip adduction 4 4  Hip internal rotation    Hip external rotation    Knee flexion 4 4  Knee extension 4+ 4+  Ankle dorsiflexion 4+ 4  Ankle plantarflexion    Ankle inversion    Ankle eversion     (Blank rows = not tested, * - tested in sitting)  FUNCTIONAL TESTS:  5 times sit to stand: 14.22 sec (09/07/22) Timed up and go (TUG): 21.28 sec with SPC 10 meter walk test: 19.94 sec with SPC; Gait speed = 1.63 ft/sec Berg Balance Scale: 38/56; 37-45 significant risk for falls (>80%)  (09/07/22) Dynamic Gait Index: 10/24; Scores of 19 or less are predicitve of falls in older community living adults (09/07/22)  PLOF (As of 06/20/22): ABC scale: 730 / 1600 = 45.6% Berg: 48/56; 46-51 moderate fall risk (>50%)  10MWT = 11.50 sec with SPC; 10.06 sec w/o AD Gait speed: 2.85 ft/sec with SPC; 3.26 ft/sec w/o AD DGI: 18/24; Scores of 19 or less are predictive of falls in older community living adults  GAIT: Distance walked: 60 ft Assistive device utilized: Single point cane Level of assistance: SBA Gait pattern: step to pattern, step through pattern, decreased step length- Right, decreased stance time- Left, decreased hip/knee flexion- Right, and decreased hip/knee flexion- Left Comments: Inconsistent step length on right creating a barrier to versus step through pattern.  Notable gait instability with SPC.   TODAY'S TREATMENT:   09/26/22 THERAPEUTIC EXERCISE: to improve flexibility, strength and mobility.  Verbal and tactile cues throughout for technique.  NuStep - L5 x 7 min Gait 1.5 laps with new walker cues to slow down in order to stay upright. Improved heel strike at lower speed Seated hip hinge R/L HS stretch 2 x 30 sec Standing R/L ITB stretch - lateral lean onto counter with outer leg tucked 2 x 30 sec Standing R/L fwd lunge hip flexor stretch at counter 2 x 30 sec Standing hip flexion L to 6 inch step 2x10, to 8 inch x 5  (challenging); R to 6 inch x 10, then Step up on 6 inch x 10 Standing alt HS curl 2x 10 bil B Side-stepping 10 ft along counter x 4    MANUAL THERAPY: IASTM with hand roller to R gastroc/soleus  09/20/22 THERAPEUTIC EXERCISE: to improve flexibility, strength and mobility.  Verbal and tactile cues throughout for technique.  NuStep - L4 x 7 min Seated  hip hinge R/L HS stretch x 30 sec Standing R/L ITB stretch - lateral lean onto counter with outer leg tucked behind x 30 sec Standing R/L fwd lunge hip flexor stretch at counter x 30 sec Standing hip flexion march at counter x 10 Standing alt HS curl x 10 Seated RTB alt R/L knee extension LAQ (looped band at ankles) x 10 Seated RTB R/L knee flexion HS curls (looped band at ankles with stationary foot propped on 9" stool) x 10 Seated hip adduction isometric ball squeeze 10 x 3" Seated hip adduction ball squeeze + alt hip IR x 10  NEUROMUSCULAR RE-EDUCATION: To improve balance, proprioception, coordination, and reduce fall risk. B Side-stepping 10 ft along counter x 4 - difficulty with L hip adduction when stepping to R   PATIENT EDUCATION:  Education details: PT eval findings, initial HEP, and gait safety with RW   Person educated: Patient Education method: Explanation, Demonstration, Verbal cues, and Handouts Education comprehension: verbalized understanding, returned demonstration, verbal cues required, and needs further education  HOME EXERCISE PROGRAM: Access Code: [email or web address] URL: [email or web address] Date: 09/20/2022 Prepared by: Annie Paras  Exercises - Supine Heel Slide with Strap  - 2 x daily - 7 x weekly - 2 sets - 10 reps - 3 sec hold - Hooklying Single Knee to Chest Stretch  - 2 x daily - 7 x weekly - 3 reps - 30 sec hold - Seated Hamstring Curls with Resistance  - 1 x daily - 7 x weekly - 2 sets - 10 reps - 3 sec hold - Seated Knee Extension with Resistance  - 1 x daily - 7 x weekly - 2 sets - 10 reps -  Standing March with Counter Support  - 1 x daily - 7 x weekly - 2 sets - 10 reps - Seated Hamstring Stretch  - 2 x daily - 7 x weekly - 3 reps - 30 sec hold - Standing ITB Stretch  - 2 x daily - 7 x weekly - 3 reps - 30 sec hold - Standing Hip Flexor Stretch  - 2 x daily - 7 x weekly - 2 sets - 10 reps - 30 sec hold   ASSESSMENT:  CLINICAL IMPRESSION: [name] presents with a new RW today. He has the handles higher than recommended in order to keep him upright and because he puts less pressure through his R UE this way. He demonstrates improved posture and improved L heel strike when walking at a normal pace with RW, however as he increases speed he flexes forward and heel strike decreases. We worked on hip flexion to step today as he reports difficulty getting into his truck with left LE. Cues needed to avoid circumduction. [name] continues to demonstrate potential for improvement and would benefit from continued skilled therapy to address impairments.     OBJECTIVE IMPAIRMENTS: Abnormal gait, decreased activity tolerance, decreased balance, decreased coordination, decreased endurance, decreased knowledge of condition, decreased knowledge of use of DME, decreased mobility, difficulty walking, decreased ROM, decreased strength, decreased safety awareness, increased fascial restrictions, impaired perceived functional ability, impaired flexibility, impaired sensation, improper body mechanics, postural dysfunction, and pain.   ACTIVITY LIMITATIONS: carrying, lifting, bending, sitting, standing, squatting, sleeping, stairs, transfers, bed mobility, bathing, dressing, locomotion level, and caring for others  PARTICIPATION LIMITATIONS: meal prep, cleaning, laundry, driving, shopping, community activity, and yard work  PERSONAL FACTORS: Age, Fitness, Past/current experiences, Time since onset of injury/illness/exacerbation, and 3+ comorbidities: Aortic atherosclerosis; carotid stenosis; cervical myelopathy s/p  ACDF 06/2020  and posterior cervical fusion 11/2020; peripheral neuropathy; lumbar DDD/spondylosis; lumbago with sciatica/lumbar radiculopathy; OA; B TKA; BTHA; L greater trochanteric pain syndrome; L RCR 2014; 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   REHAB POTENTIAL: Good  CLINICAL DECISION MAKING: Evolving/moderate complexity  EVALUATION COMPLEXITY: Moderate   GOALS: Goals reviewed with patient? Yes  SHORT TERM GOALS: Target date: 10/03/2022   Patient will be independent with initial HEP. Baseline:  Goal status: IN PROGRESS  2.  Complete balance testing and establish additional LTGs as appropriate. Baseline:  Goal status: MET 09/07/22  3.  Patient will demonstrate decreased TUG time to </= 16 sec to decrease risk for falls with transitional mobility Baseline: 21.28 sec Goal status: IN PROGRESS  LONG TERM GOALS: Target date: 10/31/2022   Patient will be independent with advanced/ongoing HEP to improve outcomes and carryover.  Baseline:  Goal status: IN PROGRESS  2.  Patient will report at least 50-75% improvement in low back and Lhip pain to improve QOL. Baseline: 7/10 Goal status: IN PROGRESS  3.  Patient will demonstrate improved L knee AROM to [facility] to allow for normal gait and stair mechanics. Baseline: AROM 0-92, PROM 0-95 Goal status: IN PROGRESS  4.  Patient will demonstrate improved B LE strength to >/= 4 to 4+/5 for improved stability and ease of mobility. Baseline:  Goal status: IN PROGRESS  5.  Patient will be able to ambulate 600' with LRAD and normal gait pattern, good stability and without increased pain to safely access community.  Baseline:  Goal status: IN PROGRESS  6. Patient will be able to ascend/descend stairs with 1 HR and reciprocal step pattern safely to access home and  community.  Baseline:  Goal status: IN PROGRESS  7.  Patient will report >/=28/80 on LEFS (patient reported outcome measure) to demonstrate improved functional ability. Baseline: 19 / 80 = 23.8 % Goal status: IN PROGRESS  8.  Patient will demonstrate at least 19/24 on DGI to decrease risk of falls. Baseline: 10/24 (09/07/22) Goal status: IN PROGRESS   9.  Patient will improve Berg score by at least 8 points to improve safety and stability with ADLs in standing and reduce risk for falls Baseline: 38/56 (09/07/22) Goal status: IN PROGRESS   PLAN:  PT FREQUENCY: 2x/week  PT DURATION: 8 weeks  PLANNED INTERVENTIONS: Therapeutic exercises, Therapeutic activity, Neuromuscular re-education, Balance training, Gait training, Patient/Family education, Self Care, Joint mobilization, Stair training, DME instructions, Dry Needling, Electrical stimulation, Cryotherapy, Moist heat, scar mobilization, Taping, Ultrasound, Ionotophoresis '4mg'$ /ml Dexamethasone, Manual therapy, and Re-evaluation  PLAN FOR NEXT SESSION: gait with rolling walker, weaning to his [facility] as appropriate; review and update initial HEP - L knee ROM, LE flexibility and strengthening   Miguelina Fore, PT 09/26/2022, 2:52 PM

## 2022-09-29 ENCOUNTER — Encounter: Payer: Medicare HMO | Admitting: Physical Therapy

## 2022-10-02 NOTE — Therapy (Signed)
OUTPATIENT PHYSICAL THERAPY TREATMENT   Patient Name: [name] Pratt MRN: [id number] DOB:[birth year], 82 y.o., male Today's Date: 10/03/2022   END OF SESSION:  PT End of Session - 10/03/22 1257     Visit Number 6    Number of Visits 27    Date for PT Re-Evaluation 10/31/22    Authorization Type [name]    Authorization Time Period 09/05/22-10/31/22    Authorization - Visit Number 5    Authorization - Number of Visits 10    Progress Note Due on Visit 23    PT Start Time 1300    PT Stop Time 1350    PT Time Calculation (min) 50 min    Activity Tolerance Patient tolerated treatment well    Behavior During Therapy [facility] for tasks assessed/performed               Past Medical History:  Diagnosis Date   Allergic rhinitis 02/02/2016   Anemia 12/17/2009   Formatting of this note might be different from the original. Anemia  10/1 IMO update   Aortic atherosclerosis ([place]) 03/05/2020   Arthritis    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   Atypical chest pain 11/24/2015   Benign essential hypertension 05/13/2009   Qualifier: Diagnosis of  By: Larwance Sachs of this note might be different from the original. Hypertension   Benign paroxysmal positional vertigo 09/14/2021   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[place]) 07/22/2021   Body mass index (BMI) 25.0-25.9, adult 11/02/2020   Cancer ([place])    skin cancer   Cardiac murmur 07/25/2021   Carotid stenosis    a. Carotid U/S 7/82: LICA < 42%, RICA 35-36%;  b.  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   Cystoid macular edema of both eyes 07/28/2020    Degenerative disc disease, lumbar 06/30/2015   Deviated nasal septum 02/18/2015   Diverticulosis 03/03/2020   DJD (degenerative joint disease) of hip 12/24/2012   Dyspnea 06/12/2011   Emphysema, unspecified ([place]) 03/05/2020   Erectile dysfunction 06/20/2017   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[place]) 02/09/2021   Gait disorder 03/02/2021   GERD (gastroesophageal reflux disease)    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   Qualifier: Diagnosis of  By: Burnett Kanaris     Greater trochanteric pain syndrome 07/22/2021   Hand pain, left 01/16/2022   Hearing loss 02/02/2016   Heme positive stool 02/29/2020   HIATAL HERNIA 05/13/2009   Qualifier: Diagnosis of  By: Burnett Kanaris     High risk medication use 01/18/2012   Hip pain 03/02/2021   History of chicken pox    History of COVID-19 12/05/2021   History of hemorrhoids    History of kidney stones    History of skin cancer 07/22/2021   skin cancer   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 Cardiology - Dr Frances Nickels at [facility] cardiology  ICD-10 cut over   Hypertension    under control   Hypokalemia 07/08/2021  Hypothyroidism    Internal hemorrhoids 03/03/2020   Leg cramps 11/13/2019   Lower GI bleed 12/12/2012   Lumbago of lumbar region with sciatica 10/21/2020   Lumbar radiculopathy 03/02/2021   Lumbar spondylosis 11/02/2020   NEPHROLITHIASIS 05/13/2009   Qualifier: Diagnosis of  By: Burnett Kanaris     Nonspecific abnormal electrocardiogram (ECG) (EKG) 01/06/2013   Numbness of hand 01/24/2022   Onychomycosis 06/30/2015   Osteoarthrosis, 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 Osteoarthritis Of The Hand  10/1 IMO update   Osteoarthrosis, unspecified whether generalized or localized,  pelvic region and thigh 07/25/2010   Formatting of this note might be different from the original. Osteoarthritis Of The Hip   Other abnormal glucose 07/22/2021   Other allergic rhinitis 02/18/2015   Palpitations 07/25/2021   Peripheral neuropathy 06/19/2016   Preventative health care 11/24/2015   Right groin pain 11/29/2012   Right inguinal hernia 05/13/2009   Qualifier: Diagnosis of  By: Burnett Kanaris     RLS (restless legs syndrome) 02/18/2015   Rosacea    Rotator cuff tear 07/27/2013   Situational depression 03/02/2021   Skin lesion 01/16/2022   Sleep apnea with use of continuous positive airway pressure (CPAP) 07/15/2013   CPAP set to  7 cm water,  Residual AHi was 7.8  With no leak.  User time 6 hours.  479 days , not used only 12 days - highly compliant 06-23-13  .    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   Urinary retention 11/26/2020   Ventral hernia 07/08/2012   Vertigo 07/08/2021   Weight loss 03/03/2020   Past Surgical History:  Procedure Laterality Date   ABDOMINAL HERNIA REPAIR  07/15/12   Dr [name]   ANTERIOR CERVICAL DECOMP/DISCECTOMY FUSION N/A 07/09/2020   Procedure: ANTERIOR CERVICAL DECOMPRESSION AND FUSION CERVICAL THREE-FOUR.;  Surgeon: [name], MD;  Location: [facility];  Service: Neurosurgery;  Laterality: N/A;  anterior   BROW LIFT  05/07/01   COLONOSCOPY WITH PROPOFOL N/A 04/24/2022   Procedure: COLONOSCOPY WITH PROPOFOL;  Surgeon: Thornton Park, MD;  Location: [facility];  Service: Gastroenterology;  Laterality: N/A;   COLONOSCOPY WITH PROPOFOL N/A 04/26/2022   Procedure: COLONOSCOPY WITH PROPOFOL;  Surgeon: Thornton Park, MD;  Location: [facility];  Service: Gastroenterology;  Laterality: N/A;   CYSTOSCOPY WITH URETEROSCOPY AND STENT PLACEMENT Right 01/28/2014   Procedure: CYSTOSCOPY WITH URETEROSCOPY, BASKET RETRIVAL AND  STENT PLACEMENT;  Surgeon: [name], MD;  Location: [facility];  Service: Urology;  Laterality: Right;   epidural injections     multiple procedures   ESOPHAGEAL DILATION  1993 and 1994   multiple times   EYE SURGERY Bilateral 03/25/10, 2012   cataract removal   HEMORRHOID SURGERY  2014   HIATAL HERNIA REPAIR  12/21/93   HOLMIUM LASER APPLICATION Right 3/71/0626   Procedure: HOLMIUM LASER APPLICATION;  Surgeon: [name], MD;  Location: [facility];  Service: Urology;  Laterality: Right;   INGUINAL HERNIA REPAIR Right 07/15/12   Dr [name], x2   JOINT REPLACEMENT  07/25/04   right knee   JOINT REPLACEMENT  07/13/10   left knee  [place]   LAPAROSCOPIC CHOLECYSTECTOMY  09/20/06   with intraoperative cholangiogram and right inguinal herniorrhaphy with mesh    LIGAMENT REPAIR Left 07/2013   shoulder   LITHOTRIPSY     x2   NASAL SEPTUM SURGERY  1975   POLYPECTOMY  04/26/2022   Procedure: POLYPECTOMY;  Surgeon: Thornton Park, MD;  Location: [facility];  Service: Gastroenterology;;   POSTERIOR CERVICAL FUSION/FORAMINOTOMY N/A 11/24/2020   Procedure: Posterior Cervical Laminectomy Cervical three-four, Cervical four-five with lateral mass fusion;  Surgeon: [name], MD;  Location: [facility];  Service: Neurosurgery;  Laterality: N/A;   REFRACTIVE SURGERY Bilateral    Right total hip replacement  4/14   SKIN CANCER DESTRUCTION     nose, ear   TONSILLECTOMY  as child   TOTAL HIP ARTHROPLASTY Left    URETHRAL DILATION  1991   [name]   Patient Active Problem List   Diagnosis Date Noted   Skin tear of right elbow without complication 42/35/3614   Skin tear of forearm without complication, right, subsequent encounter 09/18/2022   Fall 09/18/2022   Iron deficiency  anemia 09/18/2022   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   Vitamin D deficiency 08/07/2022   Closed fracture of left hip ([place]) 08/07/2022   Hand weakness 05/02/2022   Acute kidney injury ([place]) 05/02/2022   Adenomatous polyp of ascending colon    Adenomatous polyp of colon    Acute metabolic encephalopathy 43/15/4008   GI bleed 04/21/2022   Vasovagal syncope    Hemorrhagic shock (HCC)    Hematochezia    Irregular heart beat 02/03/2022   Constipation 02/03/2022   Carpal tunnel syndrome 01/24/2022   Numbness of hand 01/24/2022   Ulnar neuropathy 01/24/2022   Skin lesion 01/16/2022   History of COVID-19 12/05/2021   Benign paroxysmal positional vertigo 09/14/2021   Palpitations 07/25/2021   Cardiac murmur 07/25/2021   History of chicken pox 07/22/2021   History of hemorrhoids 07/22/2021   Hypertension 07/22/2021   COPD (chronic obstructive pulmonary disease) ([place]) 07/22/2021   History of kidney stones 07/22/2021   History of skin cancer 67/61/9509   Complication of anesthesia 07/22/2021   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[place]) 07/22/2021   Other abnormal glucose 07/22/2021   Spondylitis ([place]) 07/22/2021   Greater trochanteric pain syndrome 07/22/2021   Vertigo 07/08/2021   Hypokalemia 07/08/2021   Trochanteric bursitis of left hip 06/30/2021   Cancer ([place]) 04/11/2021   Lumbar radiculopathy 03/02/2021   Gait instability 03/02/2021   Hip pain 03/02/2021   Situational depression 03/02/2021   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[place]) 02/09/2021   Urinary retention 11/26/2020   Cervical myelopathy ([place]) 11/24/2020   Body mass index (BMI) 25.0-25.9, adult 11/02/2020   Lumbar spondylosis 11/02/2020   Lumbago of lumbar region with sciatica 10/21/2020   Cystoid macular edema of both eyes 07/28/2020   Status post cervical spinal fusion 07/09/2020   History of total bilateral knee replacement 07/08/2020   Aortic atherosclerosis  ([place]) 03/05/2020   Emphysema, unspecified ([place]) 03/05/2020   Arthritis 03/04/2020   Diverticulosis 03/03/2020   Internal hemorrhoids 03/03/2020   Weight loss 03/03/2020   Heme positive stool 02/29/2020   Leg cramps 11/13/2019   Tibialis anterior tendon tear, nontraumatic 11/13/2019   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 07/29/2018   CPAP (continuous positive airway pressure) dependence 03/25/2018   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   Erectile dysfunction 06/20/2017   Stenosis of right carotid artery without  cerebral infarction 03/06/2017   Peripheral neuropathy 06/19/2016   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   Hearing loss 02/02/2016   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   Allergic rhinitis 02/02/2016   Atypical chest pain 11/24/2015   Preventative health care 11/24/2015   Onychomycosis 06/30/2015   Degenerative disc disease, lumbar 06/30/2015   Other allergic rhinitis 02/18/2015   Deviated nasal septum 02/18/2015   RLS (restless legs syndrome) 02/18/2015   GERD (gastroesophageal reflux disease) 09/18/2014   Hyperglycemia 03/03/2014   Rotator cuff tear 07/27/2013   Sleep apnea with use of continuous positive airway pressure (CPAP) 07/15/2013   Carotid stenosis 02/24/2013   Nonspecific abnormal electrocardiogram (ECG) (EKG) 01/06/2013   DJD (degenerative joint disease) of hip 12/24/2012   Lower GI bleed 12/12/2012   Right groin pain 11/29/2012   OSA (obstructive sleep apnea) 11/25/2012   Ventral hernia 07/08/2012   High risk medication use 01/18/2012   Dyspnea 06/12/2011   Hyperlipidemia with target LDL less than 130 12/27/2010   Osteoarthrosis, unspecified whether generalized or localized, pelvic region and thigh 07/25/2010   Osteoarthrosis, hand 06/13/2010   Anemia 12/17/2009   Hypothyroidism 05/13/2009   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   Benign essential hypertension 05/13/2009   Right inguinal hernia 05/13/2009   HIATAL HERNIA 05/13/2009    NEPHROLITHIASIS 05/13/2009   ROSACEA 05/13/2009    PCP: [name], NP   REFERRING PROVIDER: [name], NP   REFERRING DIAG: [phone] (ICD-10-CM) - S/P revision of total hip  PT eval and treat for aggressive left knee ROM, weight bearing as tolerated, balance/gait training. S/p revision of left hip replacement at [facility]. (Dr. Linton Rump). No precautions.  THERAPY DIAG:  Other abnormalities of gait and mobility  Muscle weakness (generalized)  Unsteadiness on feet  Repeated falls  Other low back pain  RATIONALE FOR EVALUATION AND TREATMENT: Rehabilitation  ONSET DATE: 07/02/22  NEXT MD VISIT: 09/18/22   SUBJECTIVE:                                                                                                                                                                                                         SUBJECTIVE STATEMENT: My legs feel tired.  PAIN: PAIN:  Are you having pain? Yes: NPRS scale: 5/10 Pain location: left low back and hip Pain description: ache Aggravating factors: movement Relieving factors: rest     PERTINENT HISTORY:  Revision of femoral component of L THA on 07/02/22 s/p fall with periprosthetic fracture; Aortic atherosclerosis; carotid stenosis; cervical myelopathy s/p ACDF 06/2020 and posterior cervical fusion 11/2020; peripheral neuropathy; lumbar DDD/spondylosis;  lumbago with sciatica/lumbar radiculopathy; OA; B TKA; BTHA; L greater trochanteric pain syndrome; L RCR 2014; recent CTR surgery - early May; hearing loss; HTN; BPPV/vertigo; B sacroiliitis; skin cancer; COPD/emphysema; sleep apnea with CPAP; GERD; gout; HLD; LE cramps; RLS   PRECAUTIONS: Fall  WEIGHT BEARING RESTRICTIONS: No - FWB on left as of 08/18/22  FALLS:  Has patient fallen in last 6 months? Yes. 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 The proximal femur fracture looks healed as I cannot see the fracture line in any view. Good leg lengths.  Right hip replacement is cementless and looks well-fixed with a little varus of the tibial stem.  No bony lesion or arthritic change or other abnormality except some lumbar disc degeneration   07/21/2022 - ORTHO XR PELVIS 2 OR 3 VW UNILATERAL HIP: FINDINGS: AP and lateral views of left hip were obtained and interpreted.  Status post new left total hip revision with wire fixation appearing in good position. Good limb length. No new fracture, bony lesion, or other abnormal findings.  07/21/2022 - ORTHO XR FEMUR 2 VW UNILATERAL: FINDINGS: 3 views of the left femur were obtained and interpreted. Status post new left total hip revision with wire fixation in good position. Previous left total knee arthroplasty. No new fracture, bony lesion, or other abnormal findings.   07/02/2022 - DG HIP (WITH OR WITHOUT PELVIS) 2-3V LEFT :  FINDINGS: Three fluoroscopic spot views of the hip obtained in the operating room. Cerclage wire fixation of periprosthetic fracture, in improved alignment. Fluoroscopy time 12 seconds. Dose 1.23 mGy. IMPRESSION: Fluoroscopic spot views of  the hip during periprosthetic fracture fixation.   07/01/2022 - XR THIGH/FEMUR LEFT 2V & XR PELVIS LIMITED 1 OR 2 VIEWS AP: FINDINGS: Degenerative changes in the lower lumbar spine. Pelvis is intact. SI joints and symphysis pubis are not displaced. Right total hip arthroplasty with non cemented components. Tip of the arthroplasty is not included within the field of view. Visualized components appear well seated. Left total hip arthroplasty. The left hip demonstrates an acute periprosthetic fracture of the proximal femur with lateral displacement and mild distraction of the distal fracture fragment. No evidence of dislocation of the prosthesis. The midshaft and distal left femur are intact. Postoperative left knee arthroplasty. IMPRESSION: 1. Left hip arthroplasty with periprosthetic fracture of the proximal femur. 2. Incidental note of right hip and left knee arthroplasties.   PATIENT SURVEYS:  LEFS 19 / 80 = 23.8 %  COGNITION: Overall cognitive status: Within functional limits for tasks assessed    SENSATION: Peripheral neuropathy in B feet  EDEMA:  Mild to moderate edema in distal B LE, L>R  MUSCLE LENGTH: (09/07/22) Hamstrings: mod tight R>L ITB: mod tight B Piriformis: mod/severe tight B Hip flexors:  Quads:  Heelcord:   POSTURE:  rounded shoulders, forward head, flexed trunk , and weight shift right  PALPATION: Increased muscle tension throughout left quads and IT band as well as hamstrings  LOWER EXTREMITY ROM:  Active ROM Right eval Left eval  Hip flexion    Hip extension    Hip abduction    Hip adduction    Hip internal rotation    Hip external rotation    Knee flexion 115 92  Knee extension 0 0  Ankle dorsiflexion  limited  Ankle plantarflexion    Ankle inversion    Ankle eversion     Passive ROM Left eval  Knee flexion 95  (Blank rows = not tested)  LOWER EXTREMITY MMT:  MMT Right Eval* Left Eval*  Hip flexion 3+ 3+  Hip extension 4- 4-  Hip  abduction 4 4-  Hip adduction 4 4  Hip internal rotation    Hip external rotation    Knee flexion 4 4  Knee extension 4+ 4+  Ankle dorsiflexion 4+ 4  Ankle plantarflexion    Ankle inversion    Ankle eversion     (Blank rows = not tested, * - tested in sitting)  FUNCTIONAL TESTS:  5 times sit to stand: 14.22 sec (09/07/22) Timed up and go (TUG): 21.28 sec with SPC 10 meter walk test: 19.94 sec with SPC; Gait speed = 1.63 ft/sec Berg Balance Scale: 38/56; 37-45 significant risk for falls (>80%)  (09/07/22) Dynamic Gait Index: 10/24; Scores of 19 or less are predicitve of falls in older community living adults (09/07/22)  PLOF (As of 06/20/22): ABC scale: 730 / 1600 = 45.6% Berg: 48/56; 46-51 moderate fall risk (>50%)  10MWT = 11.50 sec with SPC; 10.06 sec w/o AD Gait speed: 2.85 ft/sec with SPC; 3.26 ft/sec w/o AD DGI: 18/24; Scores of 19 or less are predictive of falls in older community living adults  GAIT: Distance walked: 60 ft Assistive device utilized: Single point cane Level of assistance: SBA Gait pattern: step to pattern, step through pattern, decreased step length- Right, decreased stance time- Left, decreased hip/knee flexion- Right, and decreased hip/knee flexion- Left Comments: Inconsistent step length on right creating a barrier to versus step through pattern.  Notable gait instability with SPC.   TODAY'S TREATMENT:   10/03/22 THERAPEUTIC EXERCISE: to improve flexibility, strength and mobility.  Verbal and tactile cues throughout for technique.  NuStep - L5 x  5 min Standing hip flexor stretch with forearms against wall and hip drop forward x 1 min. TUG assessed Standing heel raises straight and toes out x 10 ea Mini squat 2x10 Standing hip ABD x 20 bil Supine feet on peanut: SKTC bil x 15 sec, then piriformis strech 2x 30 sec ea R side, unable to do on left so did in sitting with foot on bolster x 30 sec. Seated hip IR with ball between knees and sitting on  foam x 20 Seated HS stretch with strap on R to avoid flexion of trunk x 1 min  MANUAL THERAPY: Hooklying Hip flexor release L side only with active hip flexion x 20   09/26/22 THERAPEUTIC EXERCISE: to improve flexibility, strength and mobility.  Verbal and tactile cues throughout for technique.  NuStep - L5 x 7 min Gait 1.5 laps with new walker cues to slow  down in order to stay upright. Improved heel strike at lower speed Seated hip hinge R/L HS stretch 2 x 30 sec Standing R/L ITB stretch - lateral lean onto counter with outer leg tucked 2 x 30 sec Standing R/L fwd lunge hip flexor stretch at counter 2 x 30 sec Standing hip flexion L to 6 inch step 2x10, to 8 inch x 5 (challenging); R to 6 inch x 10, then Step up on 6 inch x 10 Standing alt HS curl 2x 10 bil B Side-stepping 10 ft along counter x 4    MANUAL THERAPY: IASTM with hand roller to R gastroc/soleus  09/20/22 THERAPEUTIC EXERCISE: to improve flexibility, strength and mobility.  Verbal and tactile cues throughout for technique.  NuStep - L4 x 7 min Seated hip hinge R/L HS stretch x 30 sec Standing R/L ITB stretch - lateral lean onto counter with outer leg tucked behind x 30 sec Standing R/L fwd lunge hip flexor stretch at counter x 30 sec Standing hip flexion march at counter x 10 Standing alt HS curl x 10 Seated RTB alt R/L knee extension LAQ (looped band at ankles) x 10 Seated RTB R/L knee flexion HS curls (looped band at ankles with stationary foot propped on 9" stool) x 10 Seated hip adduction isometric ball squeeze 10 x 3" Seated hip adduction ball squeeze + alt hip IR x 10  NEUROMUSCULAR RE-EDUCATION: To improve balance, proprioception, coordination, and reduce fall risk. B Side-stepping 10 ft along counter x 4 - difficulty with L hip adduction when stepping to R   PATIENT EDUCATION:  Education details: HEP update  Person educated: Patient Education method: Explanation, Demonstration, Verbal cues, and  Handouts Education comprehension: verbalized understanding, returned demonstration, and verbal cues required  HOME EXERCISE PROGRAM: Access Code: [email or web address] URL: [email or web address] Date: 10/03/2022 Prepared by: Almyra Free  Exercises - Supine Heel Slide with Strap  - 2 x daily - 7 x weekly - 2 sets - 10 reps - 3 sec hold - Hooklying Single Knee to Chest Stretch  - 2 x daily - 7 x weekly - 3 reps - 30 sec hold - Seated Hamstring Curls with Resistance  - 1 x daily - 7 x weekly - 2 sets - 10 reps - 3 sec hold - Seated Knee Extension with Resistance  - 1 x daily - 7 x weekly - 2 sets - 10 reps - Standing March with Counter Support  - 1 x daily - 7 x weekly - 2 sets - 10 reps - Seated Hamstring Stretch  - 2 x daily - 7 x weekly - 3 reps - 30 sec hold - Standing ITB Stretch  - 2 x daily - 7 x weekly - 3 reps - 30 sec hold - Standing Hip Flexor Stretch  - 2 x daily - 7 x weekly - 2 sets - 10 reps - 30 sec hold - Seated Hip Internal Rotation AROM (Mirrored)  - 1 x daily - 7 x weekly - 3 sets - 10 reps  ASSESSMENT:  CLINICAL IMPRESSION: [name] Pratt continues to report fatigue in BLE. He already did his exercises this morning and was active going to lunch. We did some different TE today based on his coplaints and he tolerated them well. He would benefit from focus on L hip flexibility without aggravating his low back. HS stretch was modified for this. TUG was reassessed and he scored worse than at eval, so fall risk is still present. [name] Pratt continues to demonstrate potential for improvement  and would benefit from continued skilled therapy to address impairments.      OBJECTIVE IMPAIRMENTS: Abnormal gait, decreased activity tolerance, decreased balance, decreased coordination, decreased endurance, decreased knowledge of condition, decreased knowledge of use of DME, decreased mobility, difficulty walking, decreased ROM, decreased strength, decreased safety awareness, increased fascial restrictions,  impaired perceived functional ability, impaired flexibility, impaired sensation, improper body mechanics, postural dysfunction, and pain.   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   REHAB POTENTIAL: Good  CLINICAL DECISION MAKING: Evolving/moderate complexity  EVALUATION COMPLEXITY: Moderate   GOALS: Goals reviewed with patient? Yes  SHORT TERM GOALS: Target date: 10/03/2022   Patient will be independent with initial HEP. Baseline:  Goal status: MET  2.  Complete balance testing and establish additional LTGs as appropriate. Baseline:  Goal status: MET 09/07/22  3.  Patient will demonstrate decreased TUG time to </= 16 sec to decrease risk for falls with transitional mobility Baseline: 21.28 sec, 10/03/22 28.23 sec Goal status: IN PROGRESS  LONG TERM GOALS: Target date: 10/31/2022   Patient will be independent with advanced/ongoing HEP to improve outcomes and carryover.  Baseline:  Goal status: IN PROGRESS  2.  Patient will report at least 50-75% improvement in low back and Lhip pain to improve QOL. Baseline: 7/10 Goal  status: IN PROGRESS  3.  Patient will demonstrate improved L knee AROM to [facility] to allow for normal gait and stair mechanics. Baseline: AROM 0-92, PROM 0-95 Goal status: IN PROGRESS  4.  Patient will demonstrate improved B LE strength to >/= 4 to 4+/5 for improved stability and ease of mobility. Baseline:  Goal status: IN PROGRESS  5.  Patient will be able to ambulate 600' with LRAD and normal gait pattern, good stability and without increased pain to safely access community.  Baseline:  Goal status: IN PROGRESS  6. Patient will be able to ascend/descend stairs with 1 HR and reciprocal step pattern safely to access home and community.  Baseline:  Goal status: IN PROGRESS  7.  Patient will report >/=28/80 on LEFS (patient reported outcome measure) to demonstrate improved functional ability. Baseline: 19 / 80 = 23.8 % Goal status: IN PROGRESS  8.  Patient will demonstrate at least 19/24 on DGI to decrease risk of falls. Baseline: 10/24 (09/07/22) Goal status: IN PROGRESS   9.  Patient will improve Berg score by at least 8 points to improve safety and stability with ADLs in standing and reduce risk for falls Baseline: 38/56 (09/07/22) Goal status: IN PROGRESS   PLAN:  PT FREQUENCY: 2x/week  PT DURATION: 8 weeks  PLANNED INTERVENTIONS: Therapeutic exercises, Therapeutic activity, Neuromuscular re-education, Balance training, Gait training, Patient/Family education, Self Care, Joint mobilization, Stair training, DME instructions, Dry Needling, Electrical stimulation, Cryotherapy, Moist heat, scar mobilization, Taping, Ultrasound, Ionotophoresis '4mg'$ /ml Dexamethasone, Manual therapy, and Re-evaluation  PLAN FOR NEXT SESSION: gait with  rolling walker, weaning to his [facility] as appropriate; review and update initial HEP - L knee ROM, LE flexibility and strengthening   Karry Causer, PT 10/03/2022, 2:08 PM

## 2022-10-03 ENCOUNTER — Encounter: Payer: Self-pay | Admitting: Physical Therapy

## 2022-10-03 ENCOUNTER — Ambulatory Visit: Payer: Medicare HMO | Admitting: Physical Therapy

## 2022-10-03 DIAGNOSIS — R296 Repeated falls: Secondary | ICD-10-CM | POA: Diagnosis not present

## 2022-10-03 DIAGNOSIS — R2689 Other abnormalities of gait and mobility: Secondary | ICD-10-CM | POA: Diagnosis not present

## 2022-10-03 DIAGNOSIS — M5459 Other low back pain: Secondary | ICD-10-CM

## 2022-10-03 DIAGNOSIS — R2681 Unsteadiness on feet: Secondary | ICD-10-CM

## 2022-10-03 DIAGNOSIS — M25662 Stiffness of left knee, not elsewhere classified: Secondary | ICD-10-CM | POA: Diagnosis not present

## 2022-10-03 DIAGNOSIS — M6281 Muscle weakness (generalized): Secondary | ICD-10-CM

## 2022-10-04 NOTE — Therapy (Signed)
OUTPATIENT PHYSICAL THERAPY TREATMENT   Patient Name: [name] Pratt MRN: [id number] DOB:[birth year], 82 y.o., male Today's Date: 10/05/2022   END OF SESSION:  PT End of Session - 10/05/22 1103     Visit Number 7    Number of Visits 27    Authorization Type [name]    Authorization Time Period 09/05/22-10/31/22    Authorization - Visit Number 6    Authorization - Number of Visits 10    Progress Note Due on Visit 23    PT Start Time 1100    PT Stop Time 1147    PT Time Calculation (min) 47 min    Activity Tolerance Patient tolerated treatment well    Behavior During Therapy [facility] for tasks assessed/performed               Past Medical History:  Diagnosis Date   Allergic rhinitis 02/02/2016   Anemia 12/17/2009   Formatting of this note might be different from the original. Anemia  10/1 IMO update   Aortic atherosclerosis ([place]) 03/05/2020   Arthritis    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   Atypical chest pain 11/24/2015   Benign essential hypertension 05/13/2009   Qualifier: Diagnosis of  By: Larwance Sachs of this note might be different from the original. Hypertension   Benign paroxysmal positional vertigo 09/14/2021   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[place]) 07/22/2021   Body mass index (BMI) 25.0-25.9, adult 11/02/2020   Cancer ([place])    skin cancer   Cardiac murmur 07/25/2021   Carotid stenosis    a. Carotid U/S 5/40: LICA < 08%, RICA 67-61%;  b.  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   Cystoid macular edema of both eyes 07/28/2020   Degenerative disc disease, lumbar 06/30/2015    Deviated nasal septum 02/18/2015   Diverticulosis 03/03/2020   DJD (degenerative joint disease) of hip 12/24/2012   Dyspnea 06/12/2011   Emphysema, unspecified ([place]) 03/05/2020   Erectile dysfunction 06/20/2017   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[place]) 02/09/2021   Gait disorder 03/02/2021   GERD (gastroesophageal reflux disease)    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   Qualifier: Diagnosis of  By: Burnett Kanaris     Greater trochanteric pain syndrome 07/22/2021   Hand pain, left 01/16/2022   Hearing loss 02/02/2016   Heme positive stool 02/29/2020   HIATAL HERNIA 05/13/2009   Qualifier: Diagnosis of  By: Burnett Kanaris     High risk medication use 01/18/2012   Hip pain 03/02/2021   History of chicken pox    History of COVID-19 12/05/2021   History of hemorrhoids    History of kidney stones    History of skin cancer 07/22/2021   skin cancer   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 Cardiology - Dr Frances Nickels at [facility] cardiology  ICD-10 cut over   Hypertension    under control   Hypokalemia 07/08/2021   Hypothyroidism    Internal hemorrhoids 03/03/2020  Leg cramps 11/13/2019   Lower GI bleed 12/12/2012   Lumbago of lumbar region with sciatica 10/21/2020   Lumbar radiculopathy 03/02/2021   Lumbar spondylosis 11/02/2020   NEPHROLITHIASIS 05/13/2009   Qualifier: Diagnosis of  By: Burnett Kanaris     Nonspecific abnormal electrocardiogram (ECG) (EKG) 01/06/2013   Numbness of hand 01/24/2022   Onychomycosis 06/30/2015   Osteoarthrosis, 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 Osteoarthritis Of The Hand  10/1 IMO update   Osteoarthrosis, unspecified whether generalized or localized, pelvic region and thigh 07/25/2010    Formatting of this note might be different from the original. Osteoarthritis Of The Hip   Other abnormal glucose 07/22/2021   Other allergic rhinitis 02/18/2015   Palpitations 07/25/2021   Peripheral neuropathy 06/19/2016   Preventative health care 11/24/2015   Right groin pain 11/29/2012   Right inguinal hernia 05/13/2009   Qualifier: Diagnosis of  By: Burnett Kanaris     RLS (restless legs syndrome) 02/18/2015   Rosacea    Rotator cuff tear 07/27/2013   Situational depression 03/02/2021   Skin lesion 01/16/2022   Sleep apnea with use of continuous positive airway pressure (CPAP) 07/15/2013   CPAP set to  7 cm water,  Residual AHi was 7.8  With no leak.  User time 6 hours.  479 days , not used only 12 days - highly compliant 06-23-13  .    Spondylitis (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   Urinary retention 11/26/2020   Ventral hernia 07/08/2012   Vertigo 07/08/2021   Weight loss 03/03/2020   Past Surgical History:  Procedure Laterality Date   ABDOMINAL HERNIA REPAIR  07/15/12   Dr [name]   ANTERIOR CERVICAL DECOMP/DISCECTOMY FUSION N/A 07/09/2020   Procedure: ANTERIOR CERVICAL DECOMPRESSION AND FUSION CERVICAL THREE-FOUR.;  Surgeon: [name], MD;  Location: [place];  Service: Neurosurgery;  Laterality: N/A;  anterior   BROW LIFT  05/07/01   COLONOSCOPY WITH PROPOFOL N/A 04/24/2022   Procedure: COLONOSCOPY WITH PROPOFOL;  Surgeon: Thornton Park, MD;  Location: [place];  Service: Gastroenterology;  Laterality: N/A;   COLONOSCOPY WITH PROPOFOL N/A 04/26/2022   Procedure: COLONOSCOPY WITH PROPOFOL;  Surgeon: Thornton Park, MD;  Location: [place];   Service: Gastroenterology;  Laterality: N/A;   CYSTOSCOPY WITH URETEROSCOPY AND STENT PLACEMENT Right 01/28/2014   Procedure: CYSTOSCOPY WITH URETEROSCOPY, BASKET RETRIVAL AND  STENT PLACEMENT;  Surgeon: [name], MD;  Location: [facility];  Service: Urology;  Laterality: Right;   epidural injections     multiple procedures   ESOPHAGEAL DILATION  1993 and 1994   multiple times   EYE SURGERY Bilateral 03/25/10, 2012   cataract removal   HEMORRHOID SURGERY  2014   HIATAL HERNIA REPAIR  12/21/93   HOLMIUM LASER APPLICATION Right 1/61/0960   Procedure: HOLMIUM LASER APPLICATION;  Surgeon: [name], MD;  Location: [facility];  Service: Urology;  Laterality: Right;   INGUINAL HERNIA REPAIR Right 07/15/12   Dr [name], x2   JOINT REPLACEMENT  07/25/04   right knee   JOINT REPLACEMENT  07/13/10   left knee   [place]  09/20/06   with intraoperative cholangiogram and right inguinal herniorrhaphy with mesh    LIGAMENT REPAIR Left 07/2013   shoulder   LITHOTRIPSY     x2   NASAL SEPTUM SURGERY  1975   POLYPECTOMY  04/26/2022   Procedure: POLYPECTOMY;  Surgeon: Thornton Park, MD;  Location: [place];  Service: Gastroenterology;;   POSTERIOR CERVICAL FUSION/FORAMINOTOMY N/A 11/24/2020   Procedure: Posterior Cervical Laminectomy Cervical three-four, Cervical four-five with lateral mass fusion;  Surgeon: [name], MD;  Location: [place];  Service: Neurosurgery;  Laterality: N/A;   REFRACTIVE SURGERY Bilateral    Right total hip replacement  4/14   SKIN CANCER DESTRUCTION     nose, ear   TONSILLECTOMY  as child   TOTAL HIP ARTHROPLASTY Left    URETHRAL DILATION  1991   [name]   Patient Active Problem List   Diagnosis Date Noted   Skin tear of right elbow without complication 16/06/9603   Skin tear of forearm without complication, right, subsequent encounter 09/18/2022   Fall 09/18/2022   Iron deficiency anemia 09/18/2022   S/P revision of  total hip 08/28/2022   Vitamin D deficiency 08/07/2022   Closed fracture of left hip ([place]) 08/07/2022   Hand weakness 05/02/2022   Acute kidney injury ([place]) 05/02/2022   Adenomatous polyp of ascending colon    Adenomatous polyp of colon    Acute metabolic encephalopathy 54/05/8118   GI bleed 04/21/2022   Vasovagal syncope    Hemorrhagic shock (HCC)    Hematochezia    Irregular heart beat 02/03/2022   Constipation 02/03/2022   Carpal tunnel syndrome 01/24/2022   Numbness of hand 01/24/2022   Ulnar neuropathy 01/24/2022   Skin lesion 01/16/2022   History of COVID-19 12/05/2021   Benign paroxysmal positional vertigo 09/14/2021   Palpitations 07/25/2021   Cardiac murmur 07/25/2021   History of chicken pox 07/22/2021   History of hemorrhoids 07/22/2021   Hypertension 07/22/2021   COPD (chronic obstructive pulmonary disease) ([place]) 07/22/2021   History of kidney stones 07/22/2021   History of skin cancer 14/78/2956   Complication of anesthesia 07/22/2021   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[place]) 07/22/2021   Other abnormal glucose 07/22/2021   Spondylitis ([place]) 07/22/2021   Greater trochanteric pain syndrome 07/22/2021   Vertigo 07/08/2021   Hypokalemia 07/08/2021   Trochanteric bursitis of left hip 06/30/2021   Cancer ([place]) 04/11/2021   Lumbar radiculopathy 03/02/2021   Gait instability 03/02/2021   Hip pain 03/02/2021   Situational depression 03/02/2021   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[place]) 02/09/2021   Urinary retention 11/26/2020   Cervical myelopathy ([place]) 11/24/2020   Body mass index (BMI) 25.0-25.9, adult 11/02/2020   Lumbar spondylosis 11/02/2020   Lumbago of lumbar region with sciatica 10/21/2020   Cystoid macular edema of both eyes 07/28/2020   Status post cervical spinal fusion 07/09/2020   History of total bilateral knee replacement 07/08/2020   Aortic atherosclerosis ([place]) 03/05/2020   Emphysema,  unspecified ([place]) 03/05/2020   Arthritis 03/04/2020   Diverticulosis 03/03/2020   Internal hemorrhoids 03/03/2020   Weight loss 03/03/2020   Heme positive stool 02/29/2020   Leg cramps 11/13/2019   Tibialis anterior tendon tear, nontraumatic 11/13/2019   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 07/29/2018   CPAP (continuous positive airway pressure) dependence 03/25/2018   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   Erectile dysfunction 06/20/2017   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 03/06/2017   Peripheral neuropathy 06/19/2016  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   Hearing loss 02/02/2016   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   Allergic rhinitis 02/02/2016   Atypical chest pain 11/24/2015   Preventative health care 11/24/2015   Onychomycosis 06/30/2015   Degenerative disc disease, lumbar 06/30/2015   Other allergic rhinitis 02/18/2015   Deviated nasal septum 02/18/2015   RLS (restless legs syndrome) 02/18/2015   GERD (gastroesophageal reflux disease) 09/18/2014   Hyperglycemia 03/03/2014   Rotator cuff tear 07/27/2013   Sleep apnea with use of continuous positive airway pressure (CPAP) 07/15/2013   Carotid stenosis 02/24/2013   Nonspecific abnormal electrocardiogram (ECG) (EKG) 01/06/2013   DJD (degenerative joint disease) of hip 12/24/2012   Lower GI bleed 12/12/2012   Right groin pain 11/29/2012   OSA (obstructive sleep apnea) 11/25/2012   Ventral hernia 07/08/2012   High risk medication use 01/18/2012   Dyspnea 06/12/2011   Hyperlipidemia with target LDL less than 130 12/27/2010   Osteoarthrosis, unspecified whether generalized or localized, pelvic region and thigh 07/25/2010   Osteoarthrosis, hand 06/13/2010   Anemia 12/17/2009   Hypothyroidism 05/13/2009   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   Benign essential hypertension 05/13/2009   Right inguinal hernia 05/13/2009   HIATAL HERNIA 05/13/2009   NEPHROLITHIASIS 05/13/2009   ROSACEA  05/13/2009    PCP: [name], NP   REFERRING PROVIDER: [name], NP   REFERRING DIAG: [phone] (ICD-10-CM) - S/P revision of total hip  PT eval and treat for aggressive left knee ROM, weight bearing as tolerated, balance/gait training. S/p revision of left hip replacement at [facility]. (Dr. Linton Rump). No precautions.  THERAPY DIAG:  Other abnormalities of gait and mobility  Muscle weakness (generalized)  Unsteadiness on feet  Repeated falls  Other low back pain  RATIONALE FOR EVALUATION AND TREATMENT: Rehabilitation  ONSET DATE: 07/02/22  NEXT MD VISIT: 09/18/22   SUBJECTIVE:                                                                                                                                                                                                         SUBJECTIVE STATEMENT  My legs still feel weak. PAIN: PAIN:  Are you having pain? Yes: NPRS scale: 5/10 Pain location: left low back and hip Pain description: ache Aggravating factors: movement Relieving factors: rest     PERTINENT HISTORY:  Revision of femoral component of L THA on 07/02/22 s/p fall with periprosthetic fracture; Aortic atherosclerosis; carotid stenosis; cervical myelopathy s/p ACDF 06/2020 and posterior cervical fusion 11/2020; peripheral neuropathy; lumbar DDD/spondylosis; lumbago with sciatica/lumbar radiculopathy; OA; B TKA; BTHA; L  greater trochanteric pain syndrome; L RCR 2014; recent CTR surgery - early May; hearing loss; HTN; BPPV/vertigo; B sacroiliitis; skin cancer; COPD/emphysema; sleep apnea with CPAP; GERD; gout; HLD; LE cramps; RLS   PRECAUTIONS: Fall  WEIGHT BEARING RESTRICTIONS: No - FWB on left as of 08/18/22  FALLS:  Has patient fallen in last 6 months? Yes. 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 The proximal femur fracture looks healed as I cannot see the fracture line in any view. Good leg lengths.  Right hip replacement is cementless and looks well-fixed with a little varus of the tibial stem.  No bony lesion or arthritic change or other abnormality except some lumbar disc degeneration   07/21/2022 - ORTHO XR PELVIS 2 OR 3 VW UNILATERAL HIP: FINDINGS: AP and lateral views of left hip were obtained and interpreted.  Status post new left total hip revision with wire fixation appearing in good position. Good limb length. No new fracture, bony lesion, or other abnormal findings.  07/21/2022 - ORTHO XR FEMUR 2 VW UNILATERAL: FINDINGS: 3 views of the left femur were obtained and interpreted. Status post new left total hip revision with wire fixation in good position. Previous left total knee arthroplasty. No new fracture, bony lesion, or other abnormal findings.   07/02/2022 - DG HIP (WITH OR WITHOUT PELVIS) 2-3V LEFT :  FINDINGS: Three fluoroscopic spot views of the hip obtained in the operating room. Cerclage wire fixation of periprosthetic fracture, in improved alignment. Fluoroscopy time 12 seconds. Dose 1.23 mGy. IMPRESSION: Fluoroscopic spot views of the hip during periprosthetic  fracture fixation.   07/01/2022 - XR THIGH/FEMUR LEFT 2V & XR PELVIS LIMITED 1 OR 2 VIEWS AP: FINDINGS: Degenerative changes in the lower lumbar spine. Pelvis is intact. SI joints and symphysis pubis are not displaced. Right total hip arthroplasty with non cemented components. Tip of the arthroplasty is not included within the field of view. Visualized components appear well seated. Left total hip arthroplasty. The left hip demonstrates an acute periprosthetic fracture of the proximal femur with lateral displacement and mild distraction of the distal fracture fragment. No evidence of dislocation of the prosthesis. The midshaft and distal left femur are intact. Postoperative left knee arthroplasty. IMPRESSION: 1. Left hip arthroplasty with periprosthetic fracture of the proximal femur. 2. Incidental note of right hip and left knee arthroplasties.   PATIENT SURVEYS:  LEFS 19 / 80 = 23.8 %  COGNITION: Overall cognitive status: Within functional limits for tasks assessed    SENSATION: Peripheral neuropathy in B feet    EDEMA:  Mild to moderate edema in  distal B LE, L>R  MUSCLE LENGTH: (09/07/22) Hamstrings: mod tight R>L ITB: mod tight B Piriformis: mod/severe tight B Hip flexors:  Quads:  Heelcord:   POSTURE:  rounded shoulders, forward head, flexed trunk , and weight shift right  PALPATION: Increased muscle tension throughout left quads and IT band as well as hamstrings  LOWER EXTREMITY ROM:  Active ROM Right eval Left eval  Hip flexion    Hip extension    Hip abduction    Hip adduction    Hip internal rotation    Hip external rotation    Knee flexion 115 92  Knee extension 0 0  Ankle dorsiflexion  limited  Ankle plantarflexion    Ankle inversion    Ankle eversion     Passive ROM Left eval  Knee flexion 95  (Blank rows = not tested)  LOWER EXTREMITY MMT:  MMT Right Eval* Left Eval*  Hip flexion 3+ 3+  Hip extension 4- 4-  Hip abduction 4 4-  Hip adduction 4  4  Hip internal rotation    Hip external rotation    Knee flexion 4 4  Knee extension 4+ 4+  Ankle dorsiflexion 4+ 4  Ankle plantarflexion    Ankle inversion    Ankle eversion     (Blank rows = not tested, * - tested in sitting)  FUNCTIONAL TESTS:  5 times sit to stand: 14.22 sec (09/07/22) Timed up and go (TUG): 21.28 sec with SPC 10 meter walk test: 19.94 sec with SPC; Gait speed = 1.63 ft/sec [place] Balance Scale: 38/56; 37-45 significant risk for falls (>80%)  (09/07/22) Dynamic Gait Index: 10/24; Scores of 19 or less are predicitve of falls in older community living adults (09/07/22)  PLOF (As of 06/20/22): ABC scale: 730 / 1600 = 45.6% [place]: 48/56; 46-51 moderate fall risk (>50%)  10MWT = 11.50 sec with SPC; 10.06 sec w/o AD Gait speed: 2.85 ft/sec with SPC; 3.26 ft/sec w/o AD DGI: 18/24; Scores of 19 or less are predictive of falls in older community living adults  GAIT: Distance walked: 60 ft Assistive device utilized: Single point cane Level of assistance: SBA Gait pattern: step to pattern, step through pattern, decreased step length- Right, decreased stance time- Left, decreased hip/knee flexion- Right, and decreased hip/knee flexion- Left Comments: Inconsistent step length on right creating a barrier to versus step through pattern.  Notable gait instability with SPC.   TODAY'S TREATMENT:   10/05/22 THERAPEUTIC EXERCISE: to improve flexibility, strength and mobility.  Verbal and tactile cues throughout for technique.  NuStep - L5 x  6 min Standing hip flexor stretch with forearms against wall and hip drop forward 2x 30 sec. Standing heel raises 2x 10  Mini squat 2x10 Standing hip ABD x 20 bil Bridge x 10 for strength and hip flexor stretch Supine hip IR x 10 bil Supine HS and gastroc stretch with strap bil 2x30 sec  Sit to stand 3x5 with focus on WB through L LE; used 2 inch step under R and R toe touch as cues  MANUAL THERAPY: Hooklying Hip flexor release L  side only with active hip flexion x 20 Passive stretch to bil piriformis in hooklying x 30 sec ea   10/03/22 THERAPEUTIC EXERCISE: to improve flexibility, strength and mobility.  Verbal and tactile cues throughout for technique.  NuStep - L5 x  5 min Standing hip flexor stretch with forearms against wall and hip drop forward x 1 min. TUG assessed Standing heel raises straight and toes  out x 10 ea Mini squat 2x10 Standing hip ABD x 20 bil Supine feet on peanut: SKTC bil x 15 sec, then piriformis strech 2x 30 sec ea R side, unable to do on left so did in sitting with foot on bolster x 30 sec. Seated hip IR with ball between knees and sitting on foam x 20 Seated HS stretch with strap on R to avoid flexion of trunk x 1 min  MANUAL THERAPY: Hooklying Hip flexor release L side only with active hip flexion x 20   09/26/22 THERAPEUTIC EXERCISE: to improve flexibility, strength and mobility.  Verbal and tactile cues throughout for technique.  NuStep - L5 x 7 min Gait 1.5 laps with new walker cues to slow down in order to stay upright. Improved heel strike at lower speed Seated hip hinge R/L HS stretch 2 x 30 sec Standing R/L ITB stretch - lateral lean onto counter with outer leg tucked 2 x 30 sec Standing R/L fwd lunge hip flexor stretch at counter 2 x 30 sec Standing hip flexion L to 6 inch step 2x10, to 8 inch x 5 (challenging); R to 6 inch x 10, then Step up on 6 inch x 10 Standing alt HS curl 2x 10 bil B Side-stepping 10 ft along counter x 4    MANUAL THERAPY: IASTM with hand roller to R gastroc/soleus   PATIENT EDUCATION:  Education details: HEP update  Person educated: Patient Education method: Consulting civil engineer, Media planner, Verbal cues, and Handouts Education comprehension: verbalized understanding, returned demonstration, and verbal cues required  HOME EXERCISE PROGRAM: Access Code: [email or web address] URL: [email or web address] Date: 10/03/2022 Prepared by:  Almyra Free  Exercises - Supine Heel Slide with Strap  - 2 x daily - 7 x weekly - 2 sets - 10 reps - 3 sec hold - Hooklying Single Knee to Chest Stretch  - 2 x daily - 7 x weekly - 3 reps - 30 sec hold - Seated Hamstring Curls with Resistance  - 1 x daily - 7 x weekly - 2 sets - 10 reps - 3 sec hold - Seated Knee Extension with Resistance  - 1 x daily - 7 x weekly - 2 sets - 10 reps - Standing March with Counter Support  - 1 x daily - 7 x weekly - 2 sets - 10 reps - Seated Hamstring Stretch  - 2 x daily - 7 x weekly - 3 reps - 30 sec hold - Standing ITB Stretch  - 2 x daily - 7 x weekly - 3 reps - 30 sec hold - Standing Hip Flexor Stretch  - 2 x daily - 7 x weekly - 2 sets - 10 reps - 30 sec hold - Seated Hip Internal Rotation AROM (Mirrored)  - 1 x daily - 7 x weekly - 3 sets - 10 reps  ASSESSMENT:  CLINICAL IMPRESSION: [name] had more energy today than last visit and demonstrated improved hip mobility in ER bil as well. He still fatigues fairly easily with standing TE. Sit to stand continues to be uneven with greater wt shift R.     OBJECTIVE IMPAIRMENTS: Abnormal gait, decreased activity tolerance, decreased balance, decreased coordination, decreased endurance, decreased knowledge of condition, decreased knowledge of use of DME, decreased mobility, difficulty walking, decreased ROM, decreased strength, decreased safety awareness, increased fascial restrictions, impaired perceived functional ability, impaired flexibility, impaired sensation, improper body mechanics, postural dysfunction, and pain.   ACTIVITY LIMITATIONS: carrying, lifting, bending, sitting, standing, squatting, sleeping, stairs, transfers, bed mobility, bathing, dressing, locomotion  level, and caring for others  PARTICIPATION LIMITATIONS: meal prep, cleaning, laundry, driving, shopping, community activity, and yard work  PERSONAL FACTORS: Age, Fitness, Past/current experiences, Time since onset of injury/illness/exacerbation, and  3+ comorbidities: Aortic atherosclerosis; carotid stenosis; cervical myelopathy s/p ACDF 06/2020 and posterior cervical fusion 11/2020; peripheral neuropathy; lumbar DDD/spondylosis; lumbago with sciatica/lumbar radiculopathy; OA; B TKA; BTHA; L greater trochanteric pain syndrome; L RCR 2014; 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   REHAB POTENTIAL: Good  CLINICAL DECISION MAKING: Evolving/moderate complexity  EVALUATION COMPLEXITY: Moderate   GOALS: Goals reviewed with patient? Yes  SHORT TERM GOALS: Target date: 10/03/2022   Patient will be independent with initial HEP. Baseline:  Goal status: MET  2.  Complete balance testing and establish additional LTGs as appropriate. Baseline:  Goal status: MET 09/07/22  3.  Patient will demonstrate decreased TUG time to </= 16 sec to decrease risk for falls with transitional mobility Baseline: 21.28 sec, 10/03/22 28.23 sec Goal status: IN PROGRESS  LONG TERM GOALS: Target date: 10/31/2022   Patient will be independent with advanced/ongoing HEP to improve outcomes and carryover.  Baseline:  Goal status: IN PROGRESS  2.  Patient will report at least 50-75% improvement in low back and Lhip pain to improve QOL. Baseline: 7/10 Goal status: IN PROGRESS  3.  Patient will demonstrate improved L knee AROM to [facility] to allow for normal gait and stair mechanics. Baseline: AROM 0-92, PROM 0-95 Goal status: IN PROGRESS  4.  Patient will demonstrate improved B LE strength to >/= 4 to 4+/5 for improved stability and ease of mobility. Baseline:  Goal status: IN PROGRESS  5.  Patient will be able to ambulate 600' with LRAD and normal gait pattern, good stability and without increased pain to safely access community.  Baseline:  Goal status: IN PROGRESS  6. Patient will be  able to ascend/descend stairs with 1 HR and reciprocal step pattern safely to access home and community.  Baseline:  Goal status: IN PROGRESS  7.  Patient will report >/=28/80 on LEFS (patient reported outcome measure) to demonstrate improved functional ability. Baseline: 19 / 80 = 23.8 % Goal status: IN PROGRESS  8.  Patient will demonstrate at least 19/24 on DGI to decrease risk of falls. Baseline: 10/24 (09/07/22) Goal status: IN PROGRESS   9.  Patient will improve Berg score by at least 8 points to improve safety and stability with ADLs in standing and reduce risk for falls Baseline: 38/56 (09/07/22) Goal status: IN PROGRESS   PLAN:  PT FREQUENCY: 2x/week  PT DURATION: 8 weeks  PLANNED INTERVENTIONS: Therapeutic exercises, Therapeutic activity, Neuromuscular re-education, Balance training, Gait training, Patient/Family education, Self Care, Joint mobilization, Stair training, DME instructions, Dry Needling, Electrical stimulation, Cryotherapy, Moist heat, scar mobilization, Taping, Ultrasound, Ionotophoresis '4mg'$ /ml Dexamethasone, Manual therapy, and Re-evaluation  PLAN FOR NEXT SESSION: gait with rolling walker, weaning to his [facility] as appropriate; review and update initial HEP - L knee ROM, LE flexibility and strengthening   Crixus Mcaulay, PT 10/05/2022, 11:57 AM

## 2022-10-05 ENCOUNTER — Encounter: Payer: Self-pay | Admitting: Physical Therapy

## 2022-10-05 ENCOUNTER — Ambulatory Visit: Payer: Medicare HMO | Admitting: Physical Therapy

## 2022-10-05 DIAGNOSIS — R296 Repeated falls: Secondary | ICD-10-CM | POA: Diagnosis not present

## 2022-10-05 DIAGNOSIS — M25662 Stiffness of left knee, not elsewhere classified: Secondary | ICD-10-CM | POA: Diagnosis not present

## 2022-10-05 DIAGNOSIS — M5459 Other low back pain: Secondary | ICD-10-CM | POA: Diagnosis not present

## 2022-10-05 DIAGNOSIS — M6281 Muscle weakness (generalized): Secondary | ICD-10-CM

## 2022-10-05 DIAGNOSIS — R2681 Unsteadiness on feet: Secondary | ICD-10-CM

## 2022-10-05 DIAGNOSIS — R2689 Other abnormalities of gait and mobility: Secondary | ICD-10-CM

## 2022-10-06 ENCOUNTER — Telehealth: Payer: Self-pay | Admitting: Family

## 2022-10-06 NOTE — Telephone Encounter (Signed)
Patient called to request call back from CMA about issue with his knees. Feels like when he takes a step he is going to fall. Offered an appointment but patient wants to speak with nurse first. Please call to advise.

## 2022-10-09 NOTE — Telephone Encounter (Signed)
Called patient but per wife, he was not home and I should call later.

## 2022-10-09 NOTE — Telephone Encounter (Signed)
Patient scheduled for tomorrow at 10 am

## 2022-10-10 ENCOUNTER — Ambulatory Visit: Payer: Medicare HMO | Admitting: Physical Therapy

## 2022-10-10 ENCOUNTER — Ambulatory Visit: Payer: Medicare HMO | Admitting: Family

## 2022-10-10 ENCOUNTER — Encounter: Payer: Self-pay | Admitting: Physical Therapy

## 2022-10-10 ENCOUNTER — Ambulatory Visit (INDEPENDENT_AMBULATORY_CARE_PROVIDER_SITE_OTHER): Payer: Medicare HMO | Admitting: Family

## 2022-10-10 VITALS — BP 139/58 | HR 72 | Temp 97.6°F | Resp 16 | Wt 161.0 lb

## 2022-10-10 DIAGNOSIS — M6281 Muscle weakness (generalized): Secondary | ICD-10-CM

## 2022-10-10 DIAGNOSIS — S51811D Laceration without foreign body of right forearm, subsequent encounter: Secondary | ICD-10-CM | POA: Diagnosis not present

## 2022-10-10 DIAGNOSIS — R29898 Other symptoms and signs involving the musculoskeletal system: Secondary | ICD-10-CM | POA: Insufficient documentation

## 2022-10-10 DIAGNOSIS — R296 Repeated falls: Secondary | ICD-10-CM | POA: Diagnosis not present

## 2022-10-10 DIAGNOSIS — M25662 Stiffness of left knee, not elsewhere classified: Secondary | ICD-10-CM | POA: Diagnosis not present

## 2022-10-10 DIAGNOSIS — R2681 Unsteadiness on feet: Secondary | ICD-10-CM | POA: Diagnosis not present

## 2022-10-10 DIAGNOSIS — R2689 Other abnormalities of gait and mobility: Secondary | ICD-10-CM

## 2022-10-10 DIAGNOSIS — M5459 Other low back pain: Secondary | ICD-10-CM

## 2022-10-10 HISTORY — DX: Other symptoms and signs involving the musculoskeletal system: R29.898

## 2022-10-10 NOTE — Assessment & Plan Note (Addendum)
Chronic.  I encouraged him to continue his work with PT and to follow back up with his neurosurgeon, [name].  I don't think that there is any acute orthopedic issues with his knees.  He declines knee x-rays today.

## 2022-10-10 NOTE — Assessment & Plan Note (Signed)
Improving, applied antibiotic ointment wound and clear dry dressing.

## 2022-10-10 NOTE — Therapy (Signed)
OUTPATIENT PHYSICAL THERAPY TREATMENT   Patient Name: [name] Pratt MRN: [id number] DOB:1940-11-01, 82 y.o., male Today's Date: 10/10/2022   END OF SESSION:  PT End of Session - 10/10/22 1055     Visit Number 8    Number of Visits 27    Date for PT Re-Evaluation 10/31/22    Authorization Type [name]    Authorization Time Period 09/05/22-10/31/22    Authorization - Visit Number 7    Authorization - Number of Visits 10    Progress Note Due on Visit 23    PT Start Time 0093    PT Stop Time 1145    PT Time Calculation (min) 47 min    Activity Tolerance Patient tolerated treatment well    Behavior During Therapy [facility] for tasks assessed/performed                Past Medical History:  Diagnosis Date   Allergic rhinitis 02/02/2016   Anemia 12/17/2009   Formatting of this note might be different from the original. Anemia  10/1 IMO update   Aortic atherosclerosis ([place]) 03/05/2020   Arthritis    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   Atypical chest pain 11/24/2015   Benign essential hypertension 05/13/2009   Qualifier: Diagnosis of  By: Larwance Sachs of this note might be different from the original. Hypertension   Benign paroxysmal positional vertigo 09/14/2021   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[place]) 07/22/2021   Body mass index (BMI) 25.0-25.9, adult 11/02/2020   Cancer ([place])    skin cancer   Cardiac murmur 07/25/2021   Carotid stenosis    a. Carotid U/S 8/18: LICA < 29%, RICA 93-71%;  b.  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   Cystoid macular edema of both eyes 07/28/2020    Degenerative disc disease, lumbar 06/30/2015   Deviated nasal septum 02/18/2015   Diverticulosis 03/03/2020   DJD (degenerative joint disease) of hip 12/24/2012   Dyspnea 06/12/2011   Emphysema, unspecified ([place]) 03/05/2020   Erectile dysfunction 06/20/2017   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[place]) 02/09/2021   Gait disorder 03/02/2021   GERD (gastroesophageal reflux disease)    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   Qualifier: Diagnosis of  By: Burnett Kanaris     Greater trochanteric pain syndrome 07/22/2021   Hand pain, left 01/16/2022   Hearing loss 02/02/2016   Heme positive stool 02/29/2020   HIATAL HERNIA 05/13/2009   Qualifier: Diagnosis of  By: Burnett Kanaris     High risk medication use 01/18/2012   Hip pain 03/02/2021   History of chicken pox    History of COVID-19 12/05/2021   History of hemorrhoids    History of kidney stones    History of skin cancer 07/22/2021   skin cancer   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 Cardiology - Dr Frances Nickels at [facility] cardiology  ICD-10 cut over   Hypertension    under control   Hypokalemia 07/08/2021  Hypothyroidism    Internal hemorrhoids 03/03/2020   Leg cramps 11/13/2019   Lower GI bleed 12/12/2012   Lumbago of lumbar region with sciatica 10/21/2020   Lumbar radiculopathy 03/02/2021   Lumbar spondylosis 11/02/2020   NEPHROLITHIASIS 05/13/2009   Qualifier: Diagnosis of  By: Burnett Kanaris     Nonspecific abnormal electrocardiogram (ECG) (EKG) 01/06/2013   Numbness of hand 01/24/2022   Onychomycosis 06/30/2015   Osteoarthrosis, 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 Osteoarthritis Of The Hand  10/1 IMO update   Osteoarthrosis, unspecified whether generalized or localized,  pelvic region and thigh 07/25/2010   Formatting of this note might be different from the original. Osteoarthritis Of The Hip   Other abnormal glucose 07/22/2021   Other allergic rhinitis 02/18/2015   Palpitations 07/25/2021   Peripheral neuropathy 06/19/2016   Preventative health care 11/24/2015   Right groin pain 11/29/2012   Right inguinal hernia 05/13/2009   Qualifier: Diagnosis of  By: Burnett Kanaris     RLS (restless legs syndrome) 02/18/2015   Rosacea    Rotator cuff tear 07/27/2013   Situational depression 03/02/2021   Skin lesion 01/16/2022   Sleep apnea with use of continuous positive airway pressure (CPAP) 07/15/2013   CPAP set to  7 cm water,  Residual AHi was 7.8  With no leak.  User time 6 hours.  479 days , not used only 12 days - highly compliant 06-23-13  .    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   Urinary retention 11/26/2020   Ventral hernia 07/08/2012   Vertigo 07/08/2021   Weight loss 03/03/2020   Past Surgical History:  Procedure Laterality Date   ABDOMINAL HERNIA REPAIR  07/15/12   Dr [name]   ANTERIOR CERVICAL DECOMP/DISCECTOMY FUSION N/A 07/09/2020   Procedure: ANTERIOR CERVICAL DECOMPRESSION AND FUSION CERVICAL THREE-FOUR.;  Surgeon: [name], MD;  Location: [place];  Service: Neurosurgery;  Laterality: N/A;  anterior   BROW LIFT  05/07/01   COLONOSCOPY WITH PROPOFOL N/A 04/24/2022   Procedure: COLONOSCOPY WITH PROPOFOL;  Surgeon: Thornton Park, MD;  Location: [place];  Service: Gastroenterology;  Laterality: N/A;   COLONOSCOPY WITH PROPOFOL N/A 04/26/2022   Procedure: COLONOSCOPY WITH PROPOFOL;  Surgeon: Thornton Park, MD;  Location: [place];  Service: Gastroenterology;  Laterality: N/A;   CYSTOSCOPY WITH URETEROSCOPY AND STENT PLACEMENT Right 01/28/2014   Procedure: CYSTOSCOPY WITH URETEROSCOPY, BASKET RETRIVAL AND  STENT PLACEMENT;  Surgeon: [name], MD;  Location: [facility];  Service: Urology;  Laterality: Right;   epidural injections     multiple procedures   ESOPHAGEAL DILATION  1993 and 1994   multiple times   EYE SURGERY Bilateral 03/25/10, 2012   cataract removal   HEMORRHOID SURGERY  2014   HIATAL HERNIA REPAIR  12/21/93   HOLMIUM LASER APPLICATION Right 03/10/5283   Procedure: HOLMIUM LASER APPLICATION;  Surgeon: [name], MD;  Location: [facility];  Service: Urology;  Laterality: Right;   INGUINAL HERNIA REPAIR Right 07/15/12   Dr [name], x2   JOINT REPLACEMENT  07/25/04   right knee   JOINT REPLACEMENT  07/13/10   left knee  [place]   LAPAROSCOPIC CHOLECYSTECTOMY  09/20/06   with intraoperative cholangiogram and right inguinal herniorrhaphy with mesh    LIGAMENT REPAIR Left 07/2013   shoulder   LITHOTRIPSY     x2   NASAL SEPTUM SURGERY  1975   POLYPECTOMY  04/26/2022   Procedure: POLYPECTOMY;  Surgeon: Thornton Park, MD;  Location: [place];  Service: Gastroenterology;;   POSTERIOR CERVICAL FUSION/FORAMINOTOMY N/A 11/24/2020   Procedure: Posterior Cervical Laminectomy Cervical three-four, Cervical four-five with lateral mass fusion;  Surgeon: [name], MD;  Location: [place];  Service: Neurosurgery;  Laterality: N/A;   REFRACTIVE SURGERY Bilateral    Right total hip replacement  4/14   SKIN CANCER DESTRUCTION     nose, ear   TONSILLECTOMY  as child   TOTAL HIP ARTHROPLASTY Left    URETHRAL DILATION  1991   [name]   Patient Active Problem List   Diagnosis Date Noted   Weakness of lower extremity 10/10/2022   Skin tear of right elbow without complication 56/38/7564   Skin tear of right forearm without complication 33/29/5188   Fall 09/18/2022    Iron deficiency anemia 09/18/2022   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   Vitamin D deficiency 08/07/2022   Closed fracture of left hip ([place]) 08/07/2022   Hand weakness 05/02/2022   Acute kidney injury ([place]) 05/02/2022   Adenomatous polyp of ascending colon    Adenomatous polyp of colon    Acute metabolic encephalopathy 41/66/0630   GI bleed 04/21/2022   Vasovagal syncope    Hemorrhagic shock (HCC)    Hematochezia    Irregular heart beat 02/03/2022   Constipation 02/03/2022   Carpal tunnel syndrome 01/24/2022   Numbness of hand 01/24/2022   Ulnar neuropathy 01/24/2022   Skin lesion 01/16/2022   History of COVID-19 12/05/2021   Benign paroxysmal positional vertigo 09/14/2021   Palpitations 07/25/2021   Cardiac murmur 07/25/2021   History of chicken pox 07/22/2021   History of hemorrhoids 07/22/2021   Hypertension 07/22/2021   COPD (chronic obstructive pulmonary disease) ([place]) 07/22/2021   History of kidney stones 07/22/2021   History of skin cancer 16/09/930   Complication of anesthesia 07/22/2021   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[place]) 07/22/2021   Other abnormal glucose 07/22/2021   Spondylitis ([place]) 07/22/2021   Greater trochanteric pain syndrome 07/22/2021   Vertigo 07/08/2021   Hypokalemia 07/08/2021   Trochanteric bursitis of left hip 06/30/2021   Cancer ([place]) 04/11/2021   Lumbar radiculopathy 03/02/2021   Gait instability 03/02/2021   Hip pain 03/02/2021   Situational depression 03/02/2021   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[place]) 02/09/2021   Urinary retention 11/26/2020   Cervical myelopathy ([place]) 11/24/2020   Body mass index (BMI) 25.0-25.9, adult 11/02/2020   Lumbar spondylosis 11/02/2020   Lumbago of lumbar region with sciatica 10/21/2020   Cystoid macular edema of both eyes 07/28/2020   Status post cervical spinal fusion 07/09/2020   History of total bilateral knee replacement 07/08/2020   Aortic  atherosclerosis ([place]) 03/05/2020   Emphysema, unspecified ([place]) 03/05/2020   Arthritis 03/04/2020   Diverticulosis 03/03/2020   Internal hemorrhoids 03/03/2020   Weight loss 03/03/2020   Heme positive stool 02/29/2020   Leg cramps 11/13/2019   Tibialis anterior tendon tear, nontraumatic 11/13/2019   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 07/29/2018   CPAP (continuous positive airway pressure) dependence 03/25/2018   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   Erectile dysfunction 06/20/2017   Stenosis  of right carotid 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 03/06/2017   Peripheral neuropathy 06/19/2016   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   Hearing loss 02/02/2016   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   Allergic rhinitis 02/02/2016   Atypical chest pain 11/24/2015   Preventative health care 11/24/2015   Onychomycosis 06/30/2015   Degenerative disc disease, lumbar 06/30/2015   Other allergic rhinitis 02/18/2015   Deviated nasal septum 02/18/2015   RLS (restless legs syndrome) 02/18/2015   GERD (gastroesophageal reflux disease) 09/18/2014   Hyperglycemia 03/03/2014   Rotator cuff tear 07/27/2013   Sleep apnea with use of continuous positive airway pressure (CPAP) 07/15/2013   Carotid stenosis 02/24/2013   Nonspecific abnormal electrocardiogram (ECG) (EKG) 01/06/2013   DJD (degenerative joint disease) of hip 12/24/2012   Lower GI bleed 12/12/2012   Right groin pain 11/29/2012   OSA (obstructive sleep apnea) 11/25/2012   Ventral hernia 07/08/2012   High risk medication use 01/18/2012   Dyspnea 06/12/2011   Hyperlipidemia with target LDL less than 130 12/27/2010   Osteoarthrosis, unspecified whether generalized or localized, pelvic region and thigh 07/25/2010   Osteoarthrosis, hand 06/13/2010   Anemia 12/17/2009   Hypothyroidism 05/13/2009   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   Benign essential hypertension 05/13/2009   Right inguinal hernia 05/13/2009   HIATAL HERNIA  05/13/2009   NEPHROLITHIASIS 05/13/2009   ROSACEA 05/13/2009    PCP: [name], NP   REFERRING PROVIDER: [name], NP   REFERRING DIAG: [phone] (ICD-10-CM) - S/P revision of total hip  PT eval and treat for aggressive left knee ROM, weight bearing as tolerated, balance/gait training. S/p revision of left hip replacement at [facility]. (Dr. Linton Rump). No precautions.  THERAPY DIAG:  Stiffness of left knee, not elsewhere classified  Muscle weakness (generalized)  Other abnormalities of gait and mobility  Unsteadiness on feet  Repeated falls  Other low back pain  RATIONALE FOR EVALUATION AND TREATMENT: Rehabilitation  ONSET DATE: 07/02/22  NEXT MD VISIT:  10/23/22   SUBJECTIVE:                                                                                                                                                                                                         SUBJECTIVE STATEMENT: Says he has been feeling really weak recently, says both of his knees have been giving out recently.   PAIN: PAIN:  Are you having pain? Yes: NPRS scale: 3/10 Pain location: low back and groin Pain description: ache Aggravating factors: movement Relieving factors: rest     PERTINENT HISTORY:  Revision of femoral component  of L THA on 07/02/22 s/p fall with periprosthetic fracture; Aortic atherosclerosis; carotid stenosis; cervical myelopathy s/p ACDF 06/2020 and posterior cervical fusion 11/2020; peripheral neuropathy; lumbar DDD/spondylosis; lumbago with sciatica/lumbar radiculopathy; OA; B TKA; BTHA; L greater trochanteric pain syndrome; L RCR 2014; recent CTR surgery - early May; hearing loss; HTN; BPPV/vertigo; B sacroiliitis; skin cancer; COPD/emphysema; sleep apnea with CPAP; GERD; gout; HLD; LE cramps; RLS   PRECAUTIONS: Fall  WEIGHT BEARING RESTRICTIONS: No - FWB on left as of 08/18/22  FALLS:  Has patient fallen in last 6 months? Yes. 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 The proximal femur fracture looks healed as I cannot see the fracture line in any view. Good leg lengths.  Right hip replacement is cementless and looks well-fixed with a little varus of the tibial stem.  No bony lesion or arthritic change or other abnormality except some lumbar disc degeneration   07/21/2022 - ORTHO XR PELVIS 2 OR 3 VW UNILATERAL HIP: FINDINGS: AP and lateral views of left hip were obtained and interpreted.  Status post new left total hip revision with wire fixation appearing in good position. Good limb length. No new fracture, bony lesion, or other abnormal findings.  07/21/2022 - ORTHO XR FEMUR 2 VW UNILATERAL: FINDINGS: 3 views of the left femur were obtained and interpreted. Status post new left total hip revision with wire fixation in good position. Previous left total knee arthroplasty. No new fracture, bony lesion, or other abnormal findings.   07/02/2022 - DG HIP (WITH OR WITHOUT PELVIS) 2-3V LEFT :  FINDINGS: Three fluoroscopic spot views of the hip obtained in the operating room. Cerclage  wire fixation of periprosthetic fracture, in improved alignment. Fluoroscopy time 12 seconds. Dose 1.23 mGy. IMPRESSION: Fluoroscopic spot views of the hip during periprosthetic fracture fixation.   07/01/2022 - XR THIGH/FEMUR LEFT 2V & XR PELVIS LIMITED 1 OR 2 VIEWS AP: FINDINGS: Degenerative changes in the lower lumbar spine. Pelvis is intact. SI joints and symphysis pubis are not displaced. Right total hip arthroplasty with non cemented components. Tip of the arthroplasty is not included within the field of view. Visualized components appear well seated. Left total hip arthroplasty. The left hip demonstrates an acute periprosthetic fracture of the proximal femur with lateral displacement and mild distraction of the distal fracture fragment. No evidence of dislocation of the prosthesis. The midshaft and distal left femur are intact. Postoperative left knee arthroplasty. IMPRESSION: 1. Left hip arthroplasty with periprosthetic fracture of the proximal femur. 2. Incidental note of right hip and left knee arthroplasties.   PATIENT SURVEYS:  LEFS 19 / 80 = 23.8 %  COGNITION: Overall cognitive status: Within functional limits for tasks assessed    SENSATION: Peripheral neuropathy in B feet    EDEMA:  Mild to moderate edema in distal B LE, L>R  MUSCLE LENGTH: (09/07/22) Hamstrings: mod tight R>L ITB: mod tight B Piriformis: mod/severe tight B Hip flexors:  Quads:  Heelcord:   POSTURE:  rounded shoulders, forward head, flexed trunk , and weight shift right  PALPATION: Increased muscle tension throughout left quads and IT band as well as hamstrings  LOWER EXTREMITY ROM:  Active ROM Right eval Left eval  Hip flexion    Hip extension    Hip abduction    Hip adduction    Hip internal rotation    Hip external rotation    Knee flexion 115 92  Knee extension 0 0  Ankle dorsiflexion  limited  Ankle plantarflexion    Ankle inversion    Ankle eversion     Passive ROM Left eval   Knee flexion 95  (Blank rows = not tested)  LOWER EXTREMITY MMT:  MMT Right Eval* Left Eval*  Hip flexion 3+ 3+  Hip extension 4- 4-  Hip abduction 4 4-  Hip adduction 4 4  Hip internal rotation    Hip external rotation    Knee flexion 4 4  Knee extension 4+ 4+  Ankle dorsiflexion 4+ 4  Ankle plantarflexion    Ankle inversion    Ankle eversion     (Blank rows = not tested, * - tested in sitting)  FUNCTIONAL TESTS:  5 times sit to stand: 14.22 sec (09/07/22) Timed up and go (TUG): 21.28 sec with SPC 10 meter walk test: 19.94 sec with SPC; Gait speed = 1.63 ft/sec Berg Balance Scale: 38/56; 37-45 significant risk for falls (>80%)  (09/07/22) Dynamic Gait Index: 10/24; Scores of 19 or less are predicitve of falls in older community living adults (09/07/22)  PLOF (As of 06/20/22): ABC scale: 730 / 1600 = 45.6% Berg: 48/56; 46-51 moderate fall risk (>50%)  10MWT = 11.50 sec with SPC; 10.06 sec w/o AD Gait speed: 2.85 ft/sec with SPC; 3.26 ft/sec w/o AD DGI: 18/24; Scores of 19 or less are predictive of falls in older community living adults  GAIT: Distance walked: 60 ft Assistive device utilized: Single point cane Level of assistance: SBA Gait pattern: step to pattern, step through pattern, decreased step length- Right, decreased stance time- Left, decreased hip/knee flexion- Right, and decreased hip/knee flexion- Left Comments: Inconsistent step length on right creating a barrier to versus step through pattern.  Notable gait instability with SPC.   TODAY'S TREATMENT:   10/10/22 THERAPEUTIC EXERCISE: to improve flexibility, strength and mobility.  Verbal and tactile cues throughout for technique.  NuStep - L5 x  6 min Seated Hip Hinge HS x5 10" hold bilat  Seated Knee Flexion RTB 2x10  Standing Hip Flexion RTB 2x10 bilat  Standing alt HS curl 2x 10 bil--increased difficulty with L leg bringing it back   NEUROMUSCULAR RE-EDUCATION: To improve posture, balance,  proprioception, coordination, and reduce fall risk. Tandem Stance EO 2x30"    10/05/22 THERAPEUTIC EXERCISE: to improve flexibility, strength and mobility.  Verbal and tactile cues throughout for technique.  NuStep - L5 x  6 min Standing hip flexor stretch with forearms against wall and hip drop forward 2x 30 sec. Standing heel raises 2x 10  Mini squat 2x10 Standing hip ABD x 20 bil Bridge x 10 for strength and hip  flexor stretch Supine hip IR x 10 bil Supine HS and gastroc stretch with strap bil 2x30 sec  Sit to stand 3x5 with focus on WB through L LE; used 2 inch step under R and R toe touch as cues  MANUAL THERAPY: Hooklying Hip flexor release L side only with active hip flexion x 20 Passive stretch to bil piriformis in hooklying x 30 sec ea    10/03/22 THERAPEUTIC EXERCISE: to improve flexibility, strength and mobility.  Verbal and tactile cues throughout for technique.  NuStep - L5 x  5 min Standing hip flexor stretch with forearms against wall and hip drop forward x 1 min. TUG assessed Standing heel raises straight and toes out x 10 ea Mini squat 2x10 Standing hip ABD x 20 bil Supine feet on peanut: SKTC bil x 15 sec, then piriformis strech 2x 30 sec ea R side, unable to do on left so did in sitting with foot on bolster x 30 sec. Seated hip IR with ball between knees and sitting on foam x 20 Seated HS stretch with strap on R to avoid flexion of trunk x 1 min  MANUAL THERAPY: Hooklying Hip flexor release L side only with active hip flexion x 20   PATIENT EDUCATION:  Education details: HEP update  Person educated: Patient Education method: Explanation, Demonstration, Verbal cues, and Handouts Education comprehension: verbalized understanding, returned demonstration, and verbal cues required  HOME EXERCISE PROGRAM: Access Code: [email or web address] URL: [email or web address] Date: 10/03/2022 Prepared by: Almyra Free  Exercises - Supine Heel Slide with Strap  - 2 x daily  - 7 x weekly - 2 sets - 10 reps - 3 sec hold - Hooklying Single Knee to Chest Stretch  - 2 x daily - 7 x weekly - 3 reps - 30 sec hold - Seated Hamstring Curls with Resistance  - 1 x daily - 7 x weekly - 2 sets - 10 reps - 3 sec hold - Seated Knee Extension with Resistance  - 1 x daily - 7 x weekly - 2 sets - 10 reps - Standing March with Counter Support  - 1 x daily - 7 x weekly - 2 sets - 10 reps - Seated Hamstring Stretch  - 2 x daily - 7 x weekly - 3 reps - 30 sec hold - Standing ITB Stretch  - 2 x daily - 7 x weekly - 3 reps - 30 sec hold - Standing Hip Flexor Stretch  - 2 x daily - 7 x weekly - 2 sets - 10 reps - 30 sec hold - Seated Hip Internal Rotation AROM (Mirrored)  - 1 x daily - 7 x weekly - 3 sets - 10 reps  ASSESSMENT:  CLINICAL IMPRESSION:  [name] Pratt came into today's session with pain in his low back and bilat knees. Pt has noted some increased discomfort in both of his knees with feeling that his knees want to buckle. Throughout the session today, pt needed to take frequent breaks and commented how his knees continues to give him some discomfort. Pt also noted he feels some pull on his right hip towards his quad. The emphasis for today's session was working on his strength to increase his endurance and stability while walking. Pt needs to continue to work on his balance, leg permitting. Pt will continue to benefit from PT skilled interventions to address his deficits that continue to prohibit him from being completely independent.    OBJECTIVE IMPAIRMENTS: Abnormal gait, decreased activity tolerance, decreased balance, decreased coordination, decreased  endurance, decreased knowledge of condition, decreased knowledge of use of DME, decreased mobility, difficulty walking, decreased ROM, decreased strength, decreased safety awareness, increased fascial restrictions, impaired perceived functional ability, impaired flexibility, impaired sensation, improper body mechanics, postural dysfunction,  and pain.   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   REHAB POTENTIAL: Good  CLINICAL DECISION MAKING: Evolving/moderate complexity  EVALUATION COMPLEXITY: Moderate   GOALS: Goals reviewed with patient? Yes  SHORT TERM GOALS: Target date: 10/03/2022   Patient will be independent with initial HEP. Baseline:  Goal status: MET  2.  Complete balance testing and establish additional LTGs as appropriate. Baseline:  Goal status: MET 09/07/22  3.  Patient will demonstrate decreased TUG time to </= 16 sec to decrease risk for falls with transitional mobility Baseline: 21.28 sec, 10/03/22 28.23 sec Goal status: IN PROGRESS  LONG TERM GOALS: Target date: 10/31/2022   Patient will be independent with advanced/ongoing HEP to improve outcomes and carryover.  Baseline:  Goal status: IN PROGRESS  2.  Patient will report at least 50-75% improvement in low back and Lhip pain to improve QOL. Baseline: 7/10 Goal status: IN PROGRESS  3.  Patient will demonstrate improved L knee AROM to [facility] to allow for normal gait and stair  mechanics. Baseline: AROM 0-92, PROM 0-95 Goal status: IN PROGRESS  4.  Patient will demonstrate improved B LE strength to >/= 4 to 4+/5 for improved stability and ease of mobility. Baseline:  Goal status: IN PROGRESS  5.  Patient will be able to ambulate 600' with LRAD and normal gait pattern, good stability and without increased pain to safely access community.  Baseline:  Goal status: IN PROGRESS  6. Patient will be able to ascend/descend stairs with 1 HR and reciprocal step pattern safely to access home and community.  Baseline:  Goal status: IN PROGRESS  7.  Patient will report >/=28/80 on LEFS (patient reported outcome measure) to demonstrate improved functional ability. Baseline: 19 / 80 = 23.8 % Goal status: IN PROGRESS  8.  Patient will demonstrate at least 19/24 on DGI to decrease risk of falls. Baseline: 10/24 (09/07/22) Goal status: IN PROGRESS   9.  Patient will improve Berg score by at least 8 points to improve safety and stability with ADLs in standing and reduce risk for falls Baseline: 38/56 (09/07/22) Goal status: IN PROGRESS   PLAN:  PT FREQUENCY: 2x/week  PT DURATION: 8 weeks  PLANNED INTERVENTIONS: Therapeutic exercises, Therapeutic activity, Neuromuscular re-education, Balance training, Gait training, Patient/Family education, Self Care, Joint mobilization, Stair training, DME instructions, Dry Needling, Electrical stimulation, Cryotherapy, Moist heat, scar mobilization, Taping, Ultrasound, Ionotophoresis '4mg'$ /ml Dexamethasone, Manual therapy, and Re-evaluation  PLAN FOR NEXT SESSION: Reassess L knee AROM and B LE strength; review and update HEP, continue with LE flexibility and ROM exercises; pt permitting, work on balance and strengthening exercises.  Mattheu Brodersen Romero-Perozo, Student-PT 10/10/2022, 11:52 AM

## 2022-10-10 NOTE — Progress Notes (Signed)
Subjective:     Patient ID: [name] Pratt, male    DOB: [birth year], 82 y.o.   MRN: [id number]  Chief Complaint  Patient presents with   Knee Problem    Trouble walking    HPI Patient is in today with c/o knee pain.  Reports that he has to take "real small steps."  Notes generalized LE weakness and popping in both knees. He has had both knees replaced in the past.  Continues to have pain in his lower back that radiates into the groin.   He has seen [name] in the past.  He was due to have surgery in his lumbar spine back in November but this was cancelled due to his femur fracture.  He had an MRI with 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 He has had Cervical ACDF C3/C4 in the past and also has multilevel spondylosis and facet hypertrophy of the cervical spine. He had spinal cord compression prior to this surgery.   He continues PT.    Health Maintenance Due  Topic Date Due   FOOT EXAM  09/29/2021   COVID-19 Vaccine (5 - 2023-24 season) 05/12/2022    Past Medical History:  Diagnosis Date   Allergic rhinitis 02/02/2016   Anemia 12/17/2009   Formatting of this note might be different from the original. 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 Carotid U/S 9/56: LICA < 21%, RICA 30-86%;  b.  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   Cystoid macular edema of both eyes 07/28/2020   Degenerative disc disease, lumbar 06/30/2015   Deviated nasal septum 02/18/2015   Diverticulosis 03/03/2020   DJD (degenerative joint disease) of hip 12/24/2012   Dyspnea 06/12/2011   Emphysema, unspecified ([place]) 03/05/2020   Erectile dysfunction 06/20/2017   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[place]) 02/09/2021   Gait disorder 03/02/2021   GERD (gastroesophageal reflux disease)    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   Qualifier: Diagnosis of  By: Burnett Kanaris     Greater trochanteric pain syndrome 07/22/2021   Hand pain, left 01/16/2022   Hearing loss 02/02/2016   Heme positive stool 02/29/2020   HIATAL HERNIA 05/13/2009   Qualifier: Diagnosis of  By: Burnett Kanaris     High risk medication use 01/18/2012   Hip pain 03/02/2021   History of chicken pox    History of COVID-19 12/05/2021   History of hemorrhoids    History of kidney stones    History of  skin cancer 07/22/2021   skin cancer   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 Cardiology - Dr Frances Nickels at [facility]ology  ICD-10 cut over   Hypertension    under control   Hypokalemia 07/08/2021   Hypothyroidism    Internal hemorrhoids 03/03/2020   Leg cramps 11/13/2019   Lower GI bleed 12/12/2012   Lumbago of lumbar region with sciatica 10/21/2020   Lumbar radiculopathy 03/02/2021   Lumbar spondylosis 11/02/2020   NEPHROLITHIASIS 05/13/2009    Qualifier: Diagnosis of  By: Burnett Kanaris     Nonspecific abnormal electrocardiogram (ECG) (EKG) 01/06/2013   Numbness of hand 01/24/2022   Onychomycosis 06/30/2015   Osteoarthrosis, 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 Osteoarthritis Of The Hand  10/1 IMO update   Osteoarthrosis, unspecified whether generalized or localized, pelvic region and thigh 07/25/2010   Formatting of this note might be different from the original. Osteoarthritis Of The Hip   Other abnormal glucose 07/22/2021   Other allergic rhinitis 02/18/2015   Palpitations 07/25/2021   Peripheral neuropathy 06/19/2016   Preventative health care 11/24/2015   Right groin pain 11/29/2012   Right inguinal hernia 05/13/2009   Qualifier: Diagnosis of  By: Burnett Kanaris     RLS (restless legs syndrome) 02/18/2015   Rosacea    Rotator cuff tear 07/27/2013   Situational depression 03/02/2021   Skin lesion 01/16/2022   Sleep apnea with use of continuous positive airway pressure (CPAP) 07/15/2013   CPAP set to  7 cm water,  Residual AHi was 7.8  With no leak.  User time 6 hours.  479 days , not used only 12 days - highly compliant 06-23-13  .    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   Urinary retention 11/26/2020   Ventral hernia 07/08/2012   Vertigo 07/08/2021   Weight loss 03/03/2020    Past Surgical History:  Procedure Laterality Date   ABDOMINAL HERNIA REPAIR  07/15/12   Dr [name]   ANTERIOR CERVICAL DECOMP/DISCECTOMY FUSION N/A 07/09/2020   Procedure: ANTERIOR CERVICAL DECOMPRESSION AND FUSION CERVICAL THREE-FOUR.;  Surgeon: [name], MD;   Location: [place];  Service: Neurosurgery;  Laterality: N/A;  anterior   BROW LIFT  05/07/01   COLONOSCOPY WITH PROPOFOL N/A 04/24/2022   Procedure: COLONOSCOPY WITH PROPOFOL;  Surgeon: Thornton Park, MD;  Location: [place];  Service: Gastroenterology;  Laterality: N/A;   COLONOSCOPY WITH PROPOFOL N/A 04/26/2022   Procedure: COLONOSCOPY WITH PROPOFOL;  Surgeon: Thornton Park, MD;  Location: [place];  Service: Gastroenterology;  Laterality: N/A;   CYSTOSCOPY WITH URETEROSCOPY AND STENT PLACEMENT Right 01/28/2014   Procedure: CYSTOSCOPY WITH URETEROSCOPY, BASKET RETRIVAL AND  STENT PLACEMENT;  Surgeon: [name], MD;  Location: [facility];  Service: Urology;  Laterality: Right;   epidural injections     multiple procedures   ESOPHAGEAL DILATION  1993 and 1994   multiple times   EYE SURGERY Bilateral 03/25/10, 2012   cataract removal   HEMORRHOID SURGERY  2014  HIATAL HERNIA REPAIR  12/21/93   HOLMIUM LASER APPLICATION Right 6/38/7564   Procedure: HOLMIUM LASER APPLICATION;  Surgeon: [name], MD;  Location: [facility];  Service: Urology;  Laterality: Right;   INGUINAL HERNIA REPAIR Right 07/15/12   Dr [name], x2   JOINT REPLACEMENT  07/25/04   right knee   JOINT REPLACEMENT  07/13/10   left knee   [place]  09/20/06   with intraoperative cholangiogram and right inguinal herniorrhaphy with mesh    LIGAMENT REPAIR Left 07/2013   shoulder   LITHOTRIPSY     x2   [place]   POLYPECTOMY  04/26/2022   Procedure: POLYPECTOMY;  Surgeon: Thornton Park, MD;  Location: [place];  Service: Gastroenterology;;   POSTERIOR CERVICAL FUSION/FORAMINOTOMY N/A 11/24/2020   Procedure: Posterior Cervical Laminectomy Cervical three-four, Cervical four-five with lateral mass fusion;  Surgeon: [name], MD;  Location: [place];  Service: Neurosurgery;  Laterality: N/A;   REFRACTIVE SURGERY Bilateral    Right total hip replacement   4/14   SKIN CANCER DESTRUCTION     nose, ear   TONSILLECTOMY  as child   TOTAL HIP ARTHROPLASTY Left    URETHRAL DILATION  1991   [name]    Family History  Problem Relation Age of Onset   Cancer Mother    Hypertension Other    Cancer Other    Osteoarthritis Other    Stomach cancer Neg Hx    Colon cancer Neg Hx    Esophageal cancer Neg Hx     Social History   Socioeconomic History   Marital status: Married    Spouse name: Not on file   Number of children: 1   Years of education: Not on file   Highest education level: Not on file  Occupational History   Occupation: firefighter    Comment: paints on the side   Occupation: retired  Tobacco Use   Smoking status: Former    Years: 11.00    Types: Cigarettes    Quit date: 09/11/1978    Years since quitting: 44.1   Smokeless tobacco: Never  Vaping Use   Vaping Use: Never used  Substance and Sexual Activity   Alcohol use: Not Currently    Alcohol/week: 0.0 standard drinks of alcohol    Comment: rare   Drug use: No   Sexual activity: Not Currently  Other Topics Concern   Not on file  Social History Narrative   Lives with his wife   He has one son- lives locally.   2 grandchildren   Has worked for Research officer, trade union   Enjoys wood working   Completed HS.  [employer]   Social Determinants of Health   Financial Resource Strain: Low Risk  (10/28/2021)   Overall Financial Resource Strain (CARDIA)    Difficulty of Paying Living Expenses: Not very hard  Food Insecurity: No Food Insecurity (06/05/2022)   Hunger Vital Sign    Worried About Running Out of Food in the Last Year: Never true    Ran Out of Food in the Last Year: Never true  Transportation Needs: Unknown (06/05/2022)   PRAPARE - Hydrologist (Medical): No    Lack of Transportation (Non-Medical): Not on file  Physical Activity: Insufficiently Active (10/28/2021)   Exercise Vital Sign    Days of Exercise per Week: 2 days    Minutes  of Exercise per Session: 30 min  Stress: No Stress Concern Present (01/19/2022)   [place]    Feeling of Stress : Only a little  Social Connections: Moderately Isolated (01/19/2022)   Social Connection and Isolation Panel [NHANES]    Frequency of Communication with Friends and Family: More than three times a week    Frequency of Social Gatherings with Friends and Family: More than three times a week    Attends Religious Services: Never    Marine scientist or Organizations: No    Attends Archivist Meetings: Never    Marital Status: Married  Human resources officer Violence: Not At Risk (01/19/2022)   Humiliation, Afraid, Rape, and Kick questionnaire    Fear of Current or Ex-Partner: No    Emotionally Abused: No    Physically Abused: No    Sexually Abused: No    Outpatient Medications Prior to Visit  Medication Sig Dispense Refill   acetaminophen (TYLENOL) 650 MG CR tablet Take 650-1,300 mg by mouth every 8 (eight) hours as needed for pain.     albuterol (VENTOLIN HFA) 108 (90 Base) MCG/ACT inhaler INHALE 2 PUFFS INTO THE LUNGS EVERY 6 HOURS AS NEEDED FOR SHORTNESS OF BREATH. (Patient taking differently: Inhale 2 puffs into the lungs every 6 (six) hours as needed for shortness of breath.) 1 each 3   allopurinol (ZYLOPRIM) 100 MG tablet Take 1 tablet (100 mg total) by mouth 2 (two) times daily. 180 tablet 1   amLODipine (NORVASC) 5 MG tablet Take 1.5 tablets (7.5 mg total) by mouth daily. 90 tablet 1   antiseptic oral rinse (BIOTENE) LIQD 15 mLs by Mouth Rinse route in the morning.     aspirin EC 81 MG tablet Take 81 mg by mouth at bedtime.     bacitracin ointment Apply 1 Application topically 2 (two) times daily. 120 g 0   cycloSPORINE (RESTASIS) 0.05 % ophthalmic emulsion Place 1 drop into both eyes 2 (two) times daily.     diclofenac Sodium (VOLTAREN) 1 % GEL Apply 2 g topically daily as needed (for arthritic  pain in the hands).     gabapentin (NEURONTIN) 300 MG capsule Take 1 capsule by mouth at bedtime.     GEMTESA 75 MG TABS Take 75 mg by mouth daily.     glucose blood test strip TRUE Metrix Blood Glucose Test Strip, use as instructed 100 each 12   Homeopathic Products (THERAWORX RELIEF) FOAM Apply 1 application topically 2 (two) times daily as needed (Pain).     hydrALAZINE (APRESOLINE) 50 MG tablet Take 1 tablet (50 mg total) by mouth 2 (two) times daily. 180 tablet 1   Iron, Ferrous Sulfate, 325 (65 Fe) MG TABS Take 325 mg by mouth every other day. 30 tablet 0   Lancets MISC 1 each by Does not apply route 2 (two) times daily. TRUE matrix lancets 100 each 3   levothyroxine (SYNTHROID) 150 MCG tablet Take 150 mcg 6 days a week (patient takes 75 mcg once a week) Disp #84 for 90 day supply 84 tablet 1   levothyroxine (SYNTHROID) 75 MCG tablet Take 75 mcg once a week (patient on 150 mcg 6 days a week) Dispense #24 for 90 day supply. 12 tablet 1   loratadine (CLARITIN) 10 MG tablet Take 10 mg by mouth daily as needed for allergies or rhinitis.     losartan (COZAAR) 100 MG tablet TAKE 1 TABLET EVERY DAY 90 tablet 1   Magnesium 500 MG TABS  Take 500 mg by mouth daily.     Multiple Vitamins-Minerals (MULTIVITAMIN WITH MINERALS) tablet Take 1 tablet by mouth daily.     nitroGLYCERIN (NITROSTAT) 0.4 MG SL tablet Place 1 tablet (0.4 mg total) under the tongue every 5 (five) minutes as needed for chest pain. 25 tablet 6   polyethylene glycol (MIRALAX / GLYCOLAX) 17 g packet Take 17-34 g by mouth daily as needed for mild constipation.     polyvinyl alcohol (ARTIFICIAL TEARS) 1.4 % ophthalmic solution Place 1 drop into both eyes as needed for dry eyes.     PRESCRIPTION MEDICATION CPAP- At bedtime and during any time of rest     Probiotic Product (PROBIOTIC DAILY PO) Take 1 capsule by mouth daily.     rosuvastatin (CRESTOR) 5 MG tablet Take 1 tablet (5 mg total) by mouth daily. 90 tablet 1   traMADol (ULTRAM)  50 MG tablet Take 1 tablet (50 mg total) by mouth every 12 (twelve) hours as needed. 10 tablet 0   vitamin B-12 (CYANOCOBALAMIN) 500 MCG tablet Take 500 mcg by mouth daily.     docusate sodium (COLACE) 100 MG capsule Take 1 capsule (100 mg total) by mouth 2 (two) times daily. 60 capsule 11   sodium chloride (OCEAN) 0.65 % nasal spray Place 1 spray into both nostrils as needed for congestion.     No facility-administered medications prior to visit.    Allergies  Allergen Reactions   Pravastatin Other (See Comments)    Muscle cramps   Sildenafil Other (See Comments)    Tachycardia     Oxycodone Other (See Comments)    Hallucinations    Atenolol Other (See Comments)    Bradycardia    Elastic Bandages & [Zinc] Other (See Comments)    Teflon bandages - Irritation   Metoclopramide Hcl Anxiety    ROS See HPI    Objective:    Physical Exam Constitutional:      General: He is not in acute distress.    Appearance: He is well-developed.  HENT:     Head: Normocephalic and atraumatic.  Cardiovascular:     Rate and Rhythm: Normal rate and regular rhythm.     Heart sounds: No murmur heard. Pulmonary:     Effort: Pulmonary effort is normal. No respiratory distress.     Breath sounds: Normal breath sounds. No wheezing or rales.  Musculoskeletal:     Right lower leg: 2+ Edema present.     Left lower leg: 2+ Edema present.     Comments: Bilateral LE strength is mildly reduced Bilateral knees without swelling or erythema  Skin:    General: Skin is warm and dry.     Comments: Skin tear right forearm, improving, small approximately 1 cm wide open area in middle of the wound draining clear fluid.   Neurological:     Mental Status: He is alert and oriented to person, place, and time.     Deep Tendon Reflexes:     Reflex Scores:      Patellar reflexes are 1+ on the right side and 1+ on the left side. Psychiatric:        Behavior: Behavior normal.        Thought Content: Thought  content normal.     BP (!) 139/58 (BP Location: Right Arm, Patient Position: Sitting, Cuff Size: Small)   Pulse 72   Temp 97.6 F (36.4 C) (Oral)   Resp 16   Wt 161 lb (73 kg)  SpO2 100%   BMI 25.22 kg/m  Wt Readings from Last 3 Encounters:  10/10/22 161 lb (73 kg)  09/18/22 156 lb (70.8 kg)  09/15/22 153 lb (69.4 kg)       Assessment & Plan:   Problem List Items Addressed This Visit       Unprioritized   Weakness of lower extremity - Primary    Chronic.  I encouraged him to continue his work with PT and to follow back up with his neurosurgeon, [name].  I don't think that there is any acute orthopedic issues with his knees.  He declines knee x-rays today.       Relevant Orders   Ambulatory referral to Neurosurgery   Skin tear of right forearm without complication    Improving, applied antibiotic ointment wound and clear dry dressing.       30 minutes spent on today's visit. Time was spent counseling patient about his spinal/ortho issues, coordinating care and reviewing outside neurosurgery records.   I am having [name] B. Yoon "[name]" maintain his cycloSPORINE, sodium chloride, aspirin EC, acetaminophen, Theraworx Relief, glucose blood, Lancets, diclofenac Sodium, polyethylene glycol, nitroGLYCERIN, Magnesium, albuterol, Gemtesa, traMADol, PRESCRIPTION MEDICATION, vitamin B-12, loratadine, antiseptic oral rinse, docusate sodium, polyvinyl alcohol, Probiotic Product (PROBIOTIC DAILY PO), multivitamin with minerals, allopurinol, hydrALAZINE, levothyroxine, rosuvastatin, levothyroxine, losartan, Iron (Ferrous Sulfate), gabapentin, bacitracin, and amLODipine.  No orders of the defined types were placed in this encounter.

## 2022-10-12 ENCOUNTER — Ambulatory Visit: Payer: Medicare HMO | Attending: Family | Admitting: Physical Therapy

## 2022-10-12 ENCOUNTER — Encounter: Payer: Self-pay | Admitting: Physical Therapy

## 2022-10-12 DIAGNOSIS — R2681 Unsteadiness on feet: Secondary | ICD-10-CM | POA: Diagnosis not present

## 2022-10-12 DIAGNOSIS — R296 Repeated falls: Secondary | ICD-10-CM | POA: Diagnosis not present

## 2022-10-12 DIAGNOSIS — M25662 Stiffness of left knee, not elsewhere classified: Secondary | ICD-10-CM | POA: Diagnosis not present

## 2022-10-12 DIAGNOSIS — M6281 Muscle weakness (generalized): Secondary | ICD-10-CM | POA: Diagnosis not present

## 2022-10-12 DIAGNOSIS — R2689 Other abnormalities of gait and mobility: Secondary | ICD-10-CM | POA: Diagnosis not present

## 2022-10-12 DIAGNOSIS — M5459 Other low back pain: Secondary | ICD-10-CM | POA: Diagnosis not present

## 2022-10-12 NOTE — Therapy (Signed)
OUTPATIENT PHYSICAL THERAPY TREATMENT   Patient Name: [name] Pratt MRN: [id number] DOB:November 06, 1940, 82 y.o., male Today's Date: 10/12/2022   END OF SESSION:  PT End of Session - 10/12/22 1053     Visit Number 9    Number of Visits 27    Date for PT Re-Evaluation 10/31/22    Authorization Type [name]    Authorization Time Period 09/05/22-10/31/22    Authorization - Visit Number 8    Authorization - Number of Visits 10    Progress Note Due on Visit 23    PT Start Time 3557    PT Stop Time 1057    PT Time Calculation (min) 42 min    Activity Tolerance Patient tolerated treatment well    Behavior During Therapy [facility] for tasks assessed/performed                 Past Medical History:  Diagnosis Date   Allergic rhinitis 02/02/2016   Anemia 12/17/2009   Formatting of this note might be different from the original. Anemia  10/1 IMO update   Aortic atherosclerosis ([place]) 03/05/2020   Arthritis    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   Atypical chest pain 11/24/2015   Benign essential hypertension 05/13/2009   Qualifier: Diagnosis of  By: Larwance Sachs of this note might be different from the original. Hypertension   Benign paroxysmal positional vertigo 09/14/2021   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[place]) 07/22/2021   Body mass index (BMI) 25.0-25.9, adult 11/02/2020   Cancer ([place])    skin cancer   Cardiac murmur 07/25/2021   Carotid stenosis    a. Carotid U/S 3/22: LICA < 02%, RICA 54-27%;  b.  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   Cystoid macular edema of both eyes 07/28/2020    Degenerative disc disease, lumbar 06/30/2015   Deviated nasal septum 02/18/2015   Diverticulosis 03/03/2020   DJD (degenerative joint disease) of hip 12/24/2012   Dyspnea 06/12/2011   Emphysema, unspecified ([place]) 03/05/2020   Erectile dysfunction 06/20/2017   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[name]) 02/09/2021   Gait disorder 03/02/2021   GERD (gastroesophageal reflux disease)    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   Qualifier: Diagnosis of  By: Burnett Kanaris     Greater trochanteric pain syndrome 07/22/2021   Hand pain, left 01/16/2022   Hearing loss 02/02/2016   Heme positive stool 02/29/2020   HIATAL HERNIA 05/13/2009   Qualifier: Diagnosis of  By: Burnett Kanaris     High risk medication use 01/18/2012   Hip pain 03/02/2021   History of chicken pox    History of COVID-19 12/05/2021   History of hemorrhoids    History of kidney stones    History of skin cancer 07/22/2021   skin cancer   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 Cardiology - Dr Frances Nickels at [facility] cardiology  ICD-10 cut over   Hypertension    under control   Hypokalemia  07/08/2021   Hypothyroidism    Internal hemorrhoids 03/03/2020   Leg cramps 11/13/2019   Lower GI bleed 12/12/2012   Lumbago of lumbar region with sciatica 10/21/2020   Lumbar radiculopathy 03/02/2021   Lumbar spondylosis 11/02/2020   NEPHROLITHIASIS 05/13/2009   Qualifier: Diagnosis of  By: Burnett Kanaris     Nonspecific abnormal electrocardiogram (ECG) (EKG) 01/06/2013   Numbness of hand 01/24/2022   Onychomycosis 06/30/2015   Osteoarthrosis, 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 Osteoarthritis Of The Hand  10/1 IMO update   Osteoarthrosis, unspecified whether generalized or localized,  pelvic region and thigh 07/25/2010   Formatting of this note might be different from the original. Osteoarthritis Of The Hip   Other abnormal glucose 07/22/2021   Other allergic rhinitis 02/18/2015   Palpitations 07/25/2021   Peripheral neuropathy 06/19/2016   Preventative health care 11/24/2015   Right groin pain 11/29/2012   Right inguinal hernia 05/13/2009   Qualifier: Diagnosis of  By: Burnett Kanaris     RLS (restless legs syndrome) 02/18/2015   Rosacea    Rotator cuff tear 07/27/2013   Situational depression 03/02/2021   Skin lesion 01/16/2022   Sleep apnea with use of continuous positive airway pressure (CPAP) 07/15/2013   CPAP set to  7 cm water,  Residual AHi was 7.8  With no leak.  User time 6 hours.  479 days , not used only 12 days - highly compliant 06-23-13  .    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   Urinary retention 11/26/2020   Ventral hernia 07/08/2012   Vertigo 07/08/2021   Weight loss 03/03/2020   Past Surgical History:  Procedure Laterality Date   ABDOMINAL HERNIA REPAIR  07/15/12   Dr [name]   ANTERIOR CERVICAL DECOMP/DISCECTOMY FUSION N/A 07/09/2020   Procedure: ANTERIOR CERVICAL DECOMPRESSION AND FUSION CERVICAL THREE-FOUR.;  Surgeon: [name], MD;  Location: [place];  Service: Neurosurgery;  Laterality: N/A;  anterior   BROW LIFT  05/07/01   COLONOSCOPY WITH PROPOFOL N/A 04/24/2022   Procedure: COLONOSCOPY WITH PROPOFOL;  Surgeon: Thornton Park, MD;  Location: [place];  Service: Gastroenterology;  Laterality: N/A;   COLONOSCOPY WITH PROPOFOL N/A 04/26/2022   Procedure: COLONOSCOPY WITH PROPOFOL;  Surgeon: Thornton Park, MD;  Location: [place];  Service: Gastroenterology;  Laterality: N/A;   CYSTOSCOPY WITH URETEROSCOPY AND STENT PLACEMENT Right 01/28/2014   Procedure: CYSTOSCOPY WITH URETEROSCOPY, BASKET RETRIVAL AND  STENT PLACEMENT;  Surgeon: [name], MD;  Location: [facility];  Service: Urology;  Laterality: Right;   epidural injections     multiple procedures   ESOPHAGEAL DILATION  1993 and 1994   multiple times   EYE SURGERY Bilateral 03/25/10, 2012   cataract removal   HEMORRHOID SURGERY  2014   HIATAL HERNIA REPAIR  12/21/93   HOLMIUM LASER APPLICATION Right 09/25/7260   Procedure: HOLMIUM LASER APPLICATION;  Surgeon: [name], MD;  Location: [facility];  Service: Urology;  Laterality: Right;   INGUINAL HERNIA REPAIR Right 07/15/12   Dr [name], x2   JOINT REPLACEMENT  07/25/04   right knee   JOINT REPLACEMENT  07/13/10   left  knee   KNEE SURGERY  1995   LAPAROSCOPIC CHOLECYSTECTOMY  09/20/06   with intraoperative cholangiogram and right inguinal herniorrhaphy with mesh    LIGAMENT REPAIR Left 07/2013   shoulder   LITHOTRIPSY     x2   NASAL SEPTUM SURGERY  1975   POLYPECTOMY  04/26/2022   Procedure: POLYPECTOMY;  Surgeon: Thornton Park, MD;  Location: [place];  Service: Gastroenterology;;   POSTERIOR CERVICAL FUSION/FORAMINOTOMY N/A 11/24/2020   Procedure: Posterior Cervical Laminectomy Cervical three-four, Cervical four-five with lateral mass fusion;  Surgeon: [name], MD;  Location: [place];  Service: Neurosurgery;  Laterality: N/A;   REFRACTIVE SURGERY Bilateral    Right total hip replacement  4/14   SKIN CANCER DESTRUCTION     nose, ear   TONSILLECTOMY  as child   TOTAL HIP ARTHROPLASTY Left    URETHRAL DILATION  1991   [name]   Patient Active Problem List   Diagnosis Date Noted   Weakness of lower extremity 10/10/2022   Skin tear of right elbow without complication 07/37/1062   Skin tear of right forearm without complication 69/48/5462   Fall 09/18/2022    Iron deficiency anemia 09/18/2022   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   Vitamin D deficiency 08/07/2022   Closed fracture of left hip ([place]) 08/07/2022   Hand weakness 05/02/2022   Acute kidney injury ([place]) 05/02/2022   Adenomatous polyp of ascending colon    Adenomatous polyp of colon    Acute metabolic encephalopathy 70/35/0093   GI bleed 04/21/2022   Vasovagal syncope    Hemorrhagic shock (HCC)    Hematochezia    Irregular heart beat 02/03/2022   Constipation 02/03/2022   Carpal tunnel syndrome 01/24/2022   Numbness of hand 01/24/2022   Ulnar neuropathy 01/24/2022   Skin lesion 01/16/2022   History of COVID-19 12/05/2021   Benign paroxysmal positional vertigo 09/14/2021   Palpitations 07/25/2021   Cardiac murmur 07/25/2021   History of chicken pox 07/22/2021   History of hemorrhoids 07/22/2021   Hypertension 07/22/2021   COPD (chronic obstructive pulmonary disease) ([place]) 07/22/2021   History of kidney stones 07/22/2021   History of skin cancer 81/82/9937   Complication of anesthesia 07/22/2021   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[place]) 07/22/2021   Other abnormal glucose 07/22/2021   Spondylitis ([place]) 07/22/2021   Greater trochanteric pain syndrome 07/22/2021   Vertigo 07/08/2021   Hypokalemia 07/08/2021   Trochanteric bursitis of left hip 06/30/2021   Cancer ([place]) 04/11/2021   Lumbar radiculopathy 03/02/2021   Gait instability 03/02/2021   Hip pain 03/02/2021   Situational depression 03/02/2021   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[place]) 02/09/2021   Urinary retention 11/26/2020   Cervical myelopathy ([place]) 11/24/2020   Body mass index (BMI) 25.0-25.9, adult 11/02/2020   Lumbar spondylosis 11/02/2020   Lumbago of lumbar region with sciatica 10/21/2020   Cystoid macular edema of both eyes 07/28/2020   Status post cervical spinal fusion 07/09/2020   History of total bilateral knee replacement 07/08/2020   Aortic  atherosclerosis ([place]) 03/05/2020   Emphysema, unspecified ([place]) 03/05/2020   Arthritis 03/04/2020   Diverticulosis 03/03/2020   Internal hemorrhoids 03/03/2020   Weight loss 03/03/2020   Heme positive stool 02/29/2020   Leg cramps 11/13/2019   Tibialis anterior tendon tear, nontraumatic 11/13/2019   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 07/29/2018   CPAP (continuous positive airway pressure) dependence 03/25/2018   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   Erectile dysfunction 06/20/2017  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 03/06/2017   Peripheral neuropathy 06/19/2016   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   Hearing loss 02/02/2016   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   Allergic rhinitis 02/02/2016   Atypical chest pain 11/24/2015   Preventative health care 11/24/2015   Onychomycosis 06/30/2015   Degenerative disc disease, lumbar 06/30/2015   Other allergic rhinitis 02/18/2015   Deviated nasal septum 02/18/2015   RLS (restless legs syndrome) 02/18/2015   GERD (gastroesophageal reflux disease) 09/18/2014   Hyperglycemia 03/03/2014   Rotator cuff tear 07/27/2013   Sleep apnea with use of continuous positive airway pressure (CPAP) 07/15/2013   Carotid stenosis 02/24/2013   Nonspecific abnormal electrocardiogram (ECG) (EKG) 01/06/2013   DJD (degenerative joint disease) of hip 12/24/2012   Lower GI bleed 12/12/2012   Right groin pain 11/29/2012   OSA (obstructive sleep apnea) 11/25/2012   Ventral hernia 07/08/2012   High risk medication use 01/18/2012   Dyspnea 06/12/2011   Hyperlipidemia with target LDL less than 130 12/27/2010   Osteoarthrosis, unspecified whether generalized or localized, pelvic region and thigh 07/25/2010   Osteoarthrosis, hand 06/13/2010   Anemia 12/17/2009   Hypothyroidism 05/13/2009   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   Benign essential hypertension 05/13/2009   Right inguinal hernia 05/13/2009   HIATAL HERNIA  05/13/2009   NEPHROLITHIASIS 05/13/2009   ROSACEA 05/13/2009    PCP: [name], NP   REFERRING PROVIDER: [name], NP   REFERRING DIAG: [phone] (ICD-10-CM) - S/P revision of total hip  PT eval and treat for aggressive left knee ROM, weight bearing as tolerated, balance/gait training. S/p revision of left hip replacement at [facility]. (Dr. Linton Rump). No precautions.  THERAPY DIAG:  Stiffness of left knee, not elsewhere classified  Muscle weakness (generalized)  Other abnormalities of gait and mobility  RATIONALE FOR EVALUATION AND TREATMENT: Rehabilitation  ONSET DATE: 07/02/22  NEXT MD VISIT:  10/23/22   SUBJECTIVE:                                                                                                                                                                                                         SUBJECTIVE STATEMENT: Pt states he is worn out from walking in this morning. States he feels he is dragging his Lt LE during gait  PAIN: PAIN:  Are you having pain? Yes: NPRS scale: 3/10 Pain location: low back and groin Pain description: ache Aggravating factors: movement Relieving factors: rest     PERTINENT HISTORY:  Revision of femoral component of L THA on 07/02/22 s/p fall with  periprosthetic fracture; Aortic atherosclerosis; carotid stenosis; cervical myelopathy s/p ACDF 06/2020 and posterior cervical fusion 11/2020; peripheral neuropathy; lumbar DDD/spondylosis; lumbago with sciatica/lumbar radiculopathy; OA; B TKA; BTHA; L greater trochanteric pain syndrome; L RCR 2014; recent CTR surgery - early May; hearing loss; HTN; BPPV/vertigo; B sacroiliitis; skin cancer; COPD/emphysema; sleep apnea with CPAP; GERD; gout; HLD; LE cramps; RLS   PRECAUTIONS: Fall  WEIGHT BEARING RESTRICTIONS: No - FWB on left as of 08/18/22  FALLS:  Has patient fallen in last 6 months? Yes. 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 The proximal femur fracture looks healed as I cannot see the fracture line in any view. Good leg lengths.  Right hip replacement is cementless and looks well-fixed with a little varus of the tibial stem.  No bony lesion or arthritic change or other abnormality except some lumbar disc degeneration   07/21/2022 - ORTHO XR PELVIS 2 OR 3 VW UNILATERAL HIP: FINDINGS: AP and lateral views of left hip were obtained and interpreted.  Status post new left total hip revision with wire fixation appearing in good position. Good limb length. No new fracture, bony lesion, or other abnormal findings.  07/21/2022 - ORTHO XR FEMUR 2 VW UNILATERAL: FINDINGS: 3 views of the left femur were obtained and interpreted. Status post new left total hip revision with wire fixation in good position. Previous left total knee arthroplasty. No new fracture, bony lesion, or other abnormal findings.   07/02/2022 - DG HIP (WITH OR WITHOUT PELVIS) 2-3V LEFT :  FINDINGS: Three fluoroscopic spot views of the hip obtained in the operating room. Cerclage wire fixation of periprosthetic fracture, in improved alignment.  Fluoroscopy time 12 seconds. Dose 1.23 mGy. IMPRESSION: Fluoroscopic spot views of the hip during periprosthetic fracture fixation.   07/01/2022 - XR THIGH/FEMUR LEFT 2V & XR PELVIS LIMITED 1 OR 2 VIEWS AP: FINDINGS: Degenerative changes in the lower lumbar spine. Pelvis is intact. SI joints and symphysis pubis are not displaced. Right total hip arthroplasty with non cemented components. Tip of the arthroplasty is not included within the field of view. Visualized components appear well seated. Left total hip arthroplasty. The left hip demonstrates an acute periprosthetic fracture of the proximal femur with lateral displacement and mild distraction of the distal fracture fragment. No evidence of dislocation of the prosthesis. The midshaft and distal left femur are intact. Postoperative left knee arthroplasty. IMPRESSION: 1. Left hip arthroplasty with periprosthetic fracture of the proximal femur. 2. Incidental note of right hip and left knee arthroplasties.   PATIENT SURVEYS:  LEFS 19 / 80 = 23.8 %  COGNITION: Overall cognitive status: Within functional limits for tasks assessed    SENSATION: Peripheral neuropathy in B feet    EDEMA:  Mild to moderate edema in distal B LE, L>R  MUSCLE LENGTH: (09/07/22) Hamstrings: mod tight R>L ITB: mod tight B Piriformis: mod/severe tight B Hip flexors:  Quads:  Heelcord:   POSTURE:  rounded shoulders, forward head, flexed trunk , and weight shift right  PALPATION: Increased muscle tension throughout left quads and IT band as well as hamstrings  LOWER EXTREMITY ROM:  Active ROM Right eval Left eval Left 2/1  Hip flexion     Hip extension     Hip abduction     Hip adduction     Hip internal rotation     Hip external rotation     Knee flexion 115 92 98  Knee extension 0 0   Ankle dorsiflexion  limited   Ankle plantarflexion     Ankle inversion     Ankle eversion      Passive ROM Left eval  Knee flexion 95  (Blank rows = not  tested)  LOWER EXTREMITY MMT:  MMT Right Eval* Left Eval* Right 2/1 Left 2/1  Hip flexion 3+ 3+ 3+ 3+  Hip extension 4- 4- 4- 4-  Hip abduction 4 4- 4 4-  Hip adduction 4 4 4+ 4+  Hip internal rotation      Hip external rotation      Knee flexion '4 4 4 '$ 4-  Knee extension 4+ 4+ 4+ 4+  Ankle dorsiflexion 4+ 4    Ankle plantarflexion      Ankle inversion      Ankle eversion       (Blank rows = not tested, * - tested in sitting)  FUNCTIONAL TESTS:  5 times sit to stand: 14.22 sec (09/07/22) Timed up and go (TUG): 21.28 sec with SPC 10 meter walk test: 19.94 sec with SPC; Gait speed = 1.63 ft/sec Berg Balance Scale: 38/56; 37-45 significant risk for falls (>80%)  (09/07/22) Dynamic Gait Index: 10/24; Scores of 19 or less are predicitve of falls in older community living adults (09/07/22)  PLOF (As of 06/20/22): ABC scale: 730 / 1600 = 45.6% Berg: 48/56; 46-51 moderate fall risk (>50%)  10MWT = 11.50 sec with SPC; 10.06 sec w/o AD Gait speed: 2.85 ft/sec with SPC; 3.26 ft/sec w/o AD DGI: 18/24; Scores of 19 or less are predictive of falls in older community living adults  GAIT: Distance walked: 60 ft Assistive device utilized: Single point cane Level of assistance: SBA Gait pattern: step to pattern, step through pattern, decreased step length- Right, decreased stance time- Left, decreased hip/knee flexion- Right, and decreased hip/knee flexion- Left Comments: Inconsistent step length on right creating a barrier to versus step through pattern.  Notable gait instability with SPC.   TODAY'S TREATMENT:   [facility] Adult PT Treatment:                                                DATE: 10/12/22 Therapeutic Exercise: Nustep L5 x 6 min Mini squat 2 x 10 Seated LAQ red TB 2 x 10 Standing HS curl red TB 2 x 10 Standing hip abd red TB 2 x 10 Heel raises 2 x 10 Tandem stance 2 x 30 sec Sit to stand Rt foot on 2'' step focus on LT LE  wt bearing 3 x 5   10/10/22 THERAPEUTIC EXERCISE:  to improve flexibility, strength and mobility.  Verbal and tactile cues throughout for technique.  NuStep - L5 x  6 min Seated Hip Hinge HS x5 10" hold bilat  Seated Knee Flexion RTB 2x10  Standing Hip Flexion RTB 2x10 bilat  Standing alt HS curl 2x 10 bil--increased difficulty with L leg bringing it back   NEUROMUSCULAR RE-EDUCATION: To improve posture, balance, proprioception, coordination, and reduce fall risk. Tandem Stance EO 2x30"    10/05/22 THERAPEUTIC EXERCISE: to improve flexibility, strength and mobility.  Verbal and tactile cues throughout for technique.  NuStep - L5 x  6 min Standing hip flexor stretch with forearms against wall and hip drop forward 2x 30 sec. Standing heel raises 2x 10  Mini squat 2x10 Standing hip ABD x 20 bil Bridge x 10 for strength and hip flexor stretch Supine hip IR x 10 bil Supine HS and gastroc stretch with strap bil 2x30 sec  Sit to stand 3x5 with focus on WB through L LE; used 2 inch step under R and R toe touch as cues  MANUAL THERAPY: Hooklying Hip flexor release L side only with active hip flexion x 20 Passive stretch to bil piriformis in hooklying x 30 sec ea    10/03/22 THERAPEUTIC EXERCISE: to improve flexibility, strength and mobility.  Verbal and tactile cues throughout for technique.  NuStep - L5 x  5 min Standing hip flexor stretch with forearms against wall and hip drop forward x 1 min. TUG assessed Standing heel raises straight and toes out x 10 ea Mini squat 2x10 Standing hip ABD x 20 bil Supine feet on peanut: SKTC bil x 15 sec, then piriformis strech 2x 30 sec ea R side, unable to do on left so did in sitting with foot on bolster x 30 sec. Seated hip IR with ball between knees and sitting on foam x 20 Seated HS stretch with strap on R to avoid flexion of trunk x 1 min  MANUAL THERAPY: Hooklying Hip flexor release L side only with active hip flexion x 20   PATIENT EDUCATION:  Education details: HEP update  Person  educated: Patient Education method: Explanation, Demonstration, Verbal cues, and Handouts Education comprehension: verbalized understanding, returned demonstration, and verbal cues required  HOME EXERCISE PROGRAM: Access Code: [email or web address] URL: [email or web address] Date: 10/03/2022 Prepared by: Almyra Free  Exercises - Supine Heel Slide with Strap  - 2 x daily - 7 x weekly - 2 sets - 10 reps - 3 sec hold - Hooklying Single Knee to Chest Stretch  - 2 x daily - 7 x weekly - 3 reps - 30 sec hold - Seated Hamstring Curls with Resistance  - 1 x daily - 7 x weekly - 2 sets - 10 reps - 3 sec hold - Seated Knee Extension with Resistance  - 1 x daily - 7 x weekly - 2 sets - 10 reps - Standing March with Counter Support  - 1 x daily - 7 x weekly - 2 sets - 10 reps - Seated Hamstring Stretch  - 2 x daily - 7 x weekly - 3 reps - 30 sec hold - Standing ITB Stretch  - 2 x daily - 7 x weekly - 3 reps - 30 sec hold - Standing Hip Flexor Stretch  - 2 x daily - 7 x weekly - 2 sets - 10 reps - 30 sec hold - Seated Hip Internal Rotation AROM (Mirrored)  - 1  x daily - 7 x weekly - 3 sets - 10 reps  ASSESSMENT:  CLINICAL IMPRESSION:  LE ROM and strength reassessed. Minimal changes in strength, good improvement in Lt knee flexion. Pt with increased exercise tolerance vs. Last visit.  He continues with significant LE weakness and decreased muscular endurance and will continue to benefit from skilled PT to address deficits   OBJECTIVE IMPAIRMENTS: Abnormal gait, decreased activity tolerance, decreased balance, decreased coordination, decreased endurance, decreased knowledge of condition, decreased knowledge of use of DME, decreased mobility, difficulty walking, decreased ROM, decreased strength, decreased safety awareness, increased fascial restrictions, impaired perceived functional ability, impaired flexibility, impaired sensation, improper body mechanics, postural dysfunction, and pain.   ACTIVITY  LIMITATIONS: carrying, lifting, bending, sitting, standing, squatting, sleeping, stairs, transfers, bed mobility, bathing, dressing, locomotion level, and caring for others  PARTICIPATION LIMITATIONS: meal prep, cleaning, laundry, driving, shopping, community activity, and yard work  PERSONAL FACTORS: Age, Fitness, Past/current experiences, Time since onset of injury/illness/exacerbation, and 3+ comorbidities: Aortic atherosclerosis; carotid stenosis; cervical myelopathy s/p ACDF 06/2020 and posterior cervical fusion 11/2020; peripheral neuropathy; lumbar DDD/spondylosis; lumbago with sciatica/lumbar radiculopathy; OA; B TKA; BTHA; L greater trochanteric pain syndrome; L RCR 2014; 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   REHAB POTENTIAL: Good  CLINICAL DECISION MAKING: Evolving/moderate complexity  EVALUATION COMPLEXITY: Moderate   GOALS: Goals reviewed with patient? Yes  SHORT TERM GOALS: Target date: 10/03/2022   Patient will be independent with initial HEP. Baseline:  Goal status: MET  2.  Complete balance testing and establish additional LTGs as appropriate. Baseline:  Goal status: MET 09/07/22  3.  Patient will demonstrate decreased TUG time to </= 16 sec to decrease risk for falls with transitional mobility Baseline: 21.28 sec, 10/03/22 28.23 sec Goal status: IN PROGRESS  LONG TERM GOALS: Target date: 10/31/2022   Patient will be independent with advanced/ongoing HEP to improve outcomes and carryover.  Baseline:  Goal status: IN PROGRESS  2.  Patient will report at least 50-75% improvement in low back and Lhip pain to improve QOL. Baseline: 7/10 Goal status: IN PROGRESS  3.  Patient will demonstrate improved L knee AROM to [facility] to allow for normal gait and stair mechanics. Baseline: AROM 0-92, PROM  0-95 Goal status: IN PROGRESS  4.  Patient will demonstrate improved B LE strength to >/= 4 to 4+/5 for improved stability and ease of mobility. Baseline:  Goal status: IN PROGRESS  5.  Patient will be able to ambulate 600' with LRAD and normal gait pattern, good stability and without increased pain to safely access community.  Baseline:  Goal status: IN PROGRESS  6. Patient will be able to ascend/descend stairs with 1 HR and reciprocal step pattern safely to access home and community.  Baseline:  Goal status: IN PROGRESS  7.  Patient will report >/=28/80 on LEFS (patient reported outcome measure) to demonstrate improved functional ability. Baseline: 19 / 80 = 23.8 % Goal status: IN PROGRESS  8.  Patient will demonstrate at least 19/24 on DGI to decrease risk of falls. Baseline: 10/24 (09/07/22) Goal status: IN PROGRESS   9.  Patient will improve Berg score by at least 8 points to improve safety and stability with ADLs in standing and reduce risk for falls Baseline: 38/56 (09/07/22) Goal status: IN PROGRESS   PLAN:  PT FREQUENCY: 2x/week  PT DURATION:  8 weeks  PLANNED INTERVENTIONS: Therapeutic exercises, Therapeutic activity, Neuromuscular re-education, Balance training, Gait training, Patient/Family education, Self Care, Joint mobilization, Stair training, DME instructions, Dry Needling, Electrical stimulation, Cryotherapy, Moist heat, scar mobilization, Taping, Ultrasound, Ionotophoresis '4mg'$ /ml Dexamethasone, Manual therapy, and Re-evaluation  PLAN FOR NEXT SESSION: review and update HEP, continue with LE flexibility and ROM exercises; pt permitting, work on balance and strengthening exercises.     Ann-Marie Kluge, PT 10/12/2022, 10:54 AM

## 2022-10-13 DIAGNOSIS — Z4789 Encounter for other orthopedic aftercare: Secondary | ICD-10-CM | POA: Diagnosis not present

## 2022-10-13 DIAGNOSIS — Z96642 Presence of left artificial hip joint: Secondary | ICD-10-CM | POA: Diagnosis not present

## 2022-10-13 DIAGNOSIS — Z96652 Presence of left artificial knee joint: Secondary | ICD-10-CM | POA: Diagnosis not present

## 2022-10-13 DIAGNOSIS — Z96643 Presence of artificial hip joint, bilateral: Secondary | ICD-10-CM | POA: Diagnosis not present

## 2022-10-13 DIAGNOSIS — Z471 Aftercare following joint replacement surgery: Secondary | ICD-10-CM | POA: Diagnosis not present

## 2022-10-13 DIAGNOSIS — M5136 Other intervertebral disc degeneration, lumbar region: Secondary | ICD-10-CM | POA: Diagnosis not present

## 2022-10-16 ENCOUNTER — Other Ambulatory Visit: Payer: Self-pay

## 2022-10-16 ENCOUNTER — Emergency Department (HOSPITAL_BASED_OUTPATIENT_CLINIC_OR_DEPARTMENT_OTHER): Payer: Medicare HMO

## 2022-10-16 ENCOUNTER — Emergency Department (HOSPITAL_BASED_OUTPATIENT_CLINIC_OR_DEPARTMENT_OTHER)
Admission: EM | Admit: 2022-10-16 | Discharge: 2022-10-16 | Disposition: A | Discharge status: Home / Self Care | Payer: Medicare HMO | Attending: Emergency Medicine | Admitting: Emergency Medicine

## 2022-10-16 ENCOUNTER — Encounter (HOSPITAL_BASED_OUTPATIENT_CLINIC_OR_DEPARTMENT_OTHER): Payer: Self-pay | Admitting: Emergency Medicine

## 2022-10-16 DIAGNOSIS — R600 Localized edema: Secondary | ICD-10-CM

## 2022-10-16 DIAGNOSIS — K5730 Diverticulosis of large intestine without perforation or abscess without bleeding: Secondary | ICD-10-CM | POA: Diagnosis not present

## 2022-10-16 DIAGNOSIS — R1032 Left lower quadrant pain: Secondary | ICD-10-CM | POA: Diagnosis not present

## 2022-10-16 DIAGNOSIS — Z96652 Presence of left artificial knee joint: Secondary | ICD-10-CM | POA: Diagnosis not present

## 2022-10-16 DIAGNOSIS — H35353 Cystoid macular degeneration, bilateral: Secondary | ICD-10-CM | POA: Diagnosis not present

## 2022-10-16 DIAGNOSIS — H353211 Exudative age-related macular degeneration, right eye, with active choroidal neovascularization: Secondary | ICD-10-CM | POA: Diagnosis not present

## 2022-10-16 DIAGNOSIS — Z7982 Long term (current) use of aspirin: Secondary | ICD-10-CM | POA: Insufficient documentation

## 2022-10-16 DIAGNOSIS — R944 Abnormal results of kidney function studies: Secondary | ICD-10-CM | POA: Diagnosis not present

## 2022-10-16 DIAGNOSIS — Z79899 Other long term (current) drug therapy: Secondary | ICD-10-CM | POA: Diagnosis not present

## 2022-10-16 DIAGNOSIS — M7989 Other specified soft tissue disorders: Secondary | ICD-10-CM | POA: Diagnosis not present

## 2022-10-16 DIAGNOSIS — H35073 Retinal telangiectasis, bilateral: Secondary | ICD-10-CM | POA: Diagnosis not present

## 2022-10-16 DIAGNOSIS — R1030 Lower abdominal pain, unspecified: Secondary | ICD-10-CM

## 2022-10-16 DIAGNOSIS — Z96643 Presence of artificial hip joint, bilateral: Secondary | ICD-10-CM | POA: Diagnosis not present

## 2022-10-16 DIAGNOSIS — N200 Calculus of kidney: Secondary | ICD-10-CM | POA: Diagnosis not present

## 2022-10-16 LAB — CBC WITH DIFFERENTIAL/PLATELET
Abs Immature Granulocytes: 0.02 10*3/uL (ref 0.00–0.07)
Basophils Absolute: 0.1 10*3/uL (ref 0.0–0.1)
Basophils Relative: 1 %
Eosinophils Absolute: 0.2 10*3/uL (ref 0.0–0.5)
Eosinophils Relative: 2 %
HCT: 32.4 % — ABNORMAL LOW (ref 39.0–52.0)
Hemoglobin: 10.5 g/dL — ABNORMAL LOW (ref 13.0–17.0)
Immature Granulocytes: 0 %
Lymphocytes Relative: 19 %
Lymphs Abs: 1.5 10*3/uL (ref 0.7–4.0)
MCH: 30.6 pg (ref 26.0–34.0)
MCHC: 32.4 g/dL (ref 30.0–36.0)
MCV: 94.5 fL (ref 80.0–100.0)
Monocytes Absolute: 0.8 10*3/uL (ref 0.1–1.0)
Monocytes Relative: 10 %
Neutro Abs: 5.3 10*3/uL (ref 1.7–7.7)
Neutrophils Relative %: 68 %
Platelets: 348 10*3/uL (ref 150–400)
RBC: 3.43 MIL/uL — ABNORMAL LOW (ref 4.22–5.81)
RDW: 14.3 % (ref 11.5–15.5)
WBC: 7.8 10*3/uL (ref 4.0–10.5)
nRBC: 0 % (ref 0.0–0.2)

## 2022-10-16 LAB — COMPREHENSIVE METABOLIC PANEL
ALT: 16 U/L (ref 0–44)
AST: 27 U/L (ref 15–41)
Albumin: 3.5 g/dL (ref 3.5–5.0)
Alkaline Phosphatase: 97 U/L (ref 38–126)
Anion gap: 9 (ref 5–15)
BUN: 28 mg/dL — ABNORMAL HIGH (ref 8–23)
CO2: 23 mmol/L (ref 22–32)
Calcium: 8.7 mg/dL — ABNORMAL LOW (ref 8.9–10.3)
Chloride: 106 mmol/L (ref 98–111)
Creatinine, Ser: 1.33 mg/dL — ABNORMAL HIGH (ref 0.61–1.24)
GFR, Estimated: 53 mL/min — ABNORMAL LOW (ref >60)
Glucose, Bld: 97 mg/dL (ref 70–99)
Potassium: 3.6 mmol/L (ref 3.5–5.1)
Sodium: 138 mmol/L (ref 135–145)
Total Bilirubin: 0.3 mg/dL (ref 0.3–1.2)
Total Protein: 7.4 g/dL (ref 6.5–8.1)

## 2022-10-16 LAB — LACTIC ACID, PLASMA: Lactic Acid, Venous: 1.3 mmol/L (ref 0.5–1.9)

## 2022-10-16 MED ORDER — SODIUM CHLORIDE 0.9 % IV BOLUS
500.0000 mL | Freq: Once | INTRAVENOUS | Status: AC
  Administered 2022-10-16: 500 mL via INTRAVENOUS

## 2022-10-16 MED ORDER — IOHEXOL 300 MG/ML  SOLN
100.0000 mL | Freq: Once | INTRAMUSCULAR | Status: AC | PRN
  Administered 2022-10-16: 100 mL via INTRAVENOUS

## 2022-10-16 NOTE — ED Triage Notes (Addendum)
Left leg edema for 2 months , pain to touch . Denies shortness of breath except when walking long distance ,  Reports left hip 06/2022 Existing wound  post fall he said , green and white drainage noted .

## 2022-10-16 NOTE — ED Notes (Signed)
Discharge instructions reviewed with patient. Patient verbalizes understanding, no further questions at this time. Medications and follow up information provided. No acute distress noted at time of departure.  

## 2022-10-16 NOTE — ED Provider Notes (Signed)
[facility] Provider Note  CSN: [id number] Arrival date & time: 10/16/22  1255  History  Chief Complaint  Patient presents with   Leg Swelling    left   [name] Pratt is a 82 y.o. male.  [name] Pratt is a 82 year old male presenting with 60-monthhistory of intermittent left leg swelling.  Reports left leg swelling greater than the right, improves overnight or with elevation of the feet. 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  Also s/p orthopedic hardware of left knee and right hip.  Denies left leg global redness, pain, or heat.  No shortness of breath.  He does report easily injured thin skin with poorly healing wounds, although he does not believe he has diabetes or PAD.  Has a poorly healing abrasion over left lateral calf covered with bandage; is tender around this wound but otherwise no tenderness of the left leg.   Home Medications Prior to Admission medications   Medication Sig Start Date End Date Taking? Authorizing Provider  acetaminophen (TYLENOL) 650 MG CR tablet Take 650-1,300 mg by mouth every 8 (eight) hours as needed for pain.    [provider]  albuterol (VENTOLIN HFA) 108 (90 Base) MCG/ACT inhaler INHALE 2 PUFFS INTO THE LUNGS EVERY 6 HOURS AS NEEDED FOR SHORTNESS OF BREATH. Patient taking differently: Inhale 2 puffs into the lungs every 6 (six) hours as needed for shortness of breath. 10/20/21   ODebbrah Alar NP  allopurinol (ZYLOPRIM) 100 MG tablet Take 1 tablet (100 mg total) by mouth 2 (two) times daily. 08/07/22   ODebbrah Alar NP  amLODipine (NORVASC) 5 MG tablet Take 1.5 tablets (7.5 mg total) by mouth daily. 09/22/22   ODebbrah Alar NP  antiseptic oral rinse (BIOTENE) LIQD 15 mLs by Mouth Rinse route in the morning.    [provider]  aspirin EC 81 MG  tablet Take 81 mg by mouth at bedtime.    [provider]  bacitracin ointment Apply 1 Application topically 2 (two) times daily. 09/15/22   [name], [name], DO  cycloSPORINE (RESTASIS) 0.05 % ophthalmic emulsion Place 1 drop into both eyes 2 (two) times daily.    [provider]  diclofenac Sodium (VOLTAREN) 1 % GEL Apply 2 g topically daily as needed (for arthritic pain in the hands).      docusate sodium (COLACE) 100 MG capsule Take 1 capsule (100 mg total) by mouth 2 (two) times daily. 04/27/22 04/27/23  GBarb Merino MD  gabapentin (NEURONTIN) 300 MG capsule Take 1 capsule by mouth at bedtime. 12/02/19   [provider]  GEMTESA 75 MG TABS Take 75 mg by mouth daily. 10/20/21   [provider]  glucose blood test strip TRUE Metrix Blood Glucose Test Strip, use as instructed 11/12/20   ODebbrah Alar NP  Homeopathic Products ([place] FOAM Apply 1 application topically 2 (two) times daily as needed (Pain).    [provider]  hydrALAZINE (APRESOLINE) 50 MG tablet Take 1 tablet (50 mg total) by mouth 2 (two) times daily. 08/07/22   ODebbrah Alar NP  Iron, Ferrous Sulfate, 325 (65 Fe) MG TABS Take 325 mg by mouth every other day. 08/10/22   ODebbrah Alar NP  Lancets MISC 1 each by Does not apply route 2 (two) times daily. TRUE matrix lancets 11/12/20   ODebbrah Alar NP  levothyroxine (SYNTHROID) 150 MCG tablet Take 150 mcg 6 days a week (patient takes 75 mcg once a week) Disp #84 for 90 day supply 08/07/22   [name] Alar, NP  levothyroxine (SYNTHROID) 75 MCG tablet Take 75 mcg once a week (patient on 150 mcg 6 days a week) Dispense #24 for 90 day supply. 08/07/22   [name] Alar, NP  loratadine (CLARITIN) 10 MG tablet Take 10 mg by mouth daily as needed for allergies or rhinitis.    [provider]  losartan (COZAAR) 100 MG tablet TAKE 1 TABLET EVERY DAY 08/10/22   [name] Alar, NP  Magnesium 500 MG  TABS Take 500 mg by mouth daily.    [provider]  Multiple Vitamins-Minerals (MULTIVITAMIN WITH MINERALS) tablet Take 1 tablet by mouth daily.    [provider]  nitroGLYCERIN (NITROSTAT) 0.4 MG SL tablet Place 1 tablet (0.4 mg total) under the tongue every 5 (five) minutes as needed for chest pain. 09/16/21   [name] Hector, MD  polyethylene glycol (MIRALAX / GLYCOLAX) 17 g packet Take 17-34 g by mouth daily as needed for mild constipation.    [provider]  polyvinyl alcohol (ARTIFICIAL TEARS) 1.4 % ophthalmic solution Place 1 drop into both eyes as needed for dry eyes.    [provider]  PRESCRIPTION MEDICATION CPAP- At bedtime and during any time of rest    [provider]  Probiotic Product (PROBIOTIC DAILY PO) Take 1 capsule by mouth daily.    [provider]  rosuvastatin (CRESTOR) 5 MG tablet Take 1 tablet (5 mg total) by mouth daily. 08/07/22   [name] Alar, NP  sodium chloride (OCEAN) 0.65 % nasal spray Place 1 spray into both nostrils as needed for congestion.    [provider]  traMADol (ULTRAM) 50 MG tablet Take 1 tablet (50 mg total) by mouth every 12 (twelve) hours as needed. 01/16/22   [name] Alar, NP  vitamin B-12 (CYANOCOBALAMIN) 500 MCG tablet Take 500 mcg by mouth daily.    [provider]     Allergies    Pravastatin, Sildenafil, Oxycodone, Atenolol, Elastic bandages & [zinc], and Metoclopramide hcl    Review of Systems   Review of Systems  Constitutional:  Negative for activity change and fatigue.  Respiratory:  Negative for chest tightness and shortness of breath.   Cardiovascular:  Positive for leg swelling. Negative for chest pain and palpitations.  Gastrointestinal:  Negative for abdominal pain.  Skin:  Positive for wound.  Hematological:  Bruises/bleeds easily.    Physical Exam Updated Vital Signs BP 132/68   Pulse 69   Temp 98.3 F (36.8 C) (Oral)   Resp 18   SpO2  97%  Physical Exam Vitals and nursing note reviewed.  Constitutional:      General: He is not in acute distress.    Appearance: Normal appearance. He is normal weight. He is not ill-appearing or toxic-appearing.  HENT:     Head: Normocephalic.  Cardiovascular:     Rate and Rhythm: Normal rate and regular rhythm.     Pulses: Normal pulses.     Heart sounds: Normal heart sounds.  Pulmonary:     Effort: Pulmonary effort is normal.     Breath sounds: Normal breath sounds. No wheezing, rhonchi or rales.  Chest:     Chest wall: No tenderness.  Abdominal:     General: Abdomen is flat.     Palpations: Abdomen is soft.  Musculoskeletal:     Right  lower leg: Edema (1+ to mid shin) present.     Left lower leg: Edema (2+ at ankle, 1+ at knee) present.  Skin:    General: Skin is warm.     Capillary Refill: Capillary refill takes less than 2 seconds.  Neurological:     General: No focal deficit present.     Mental Status: He is alert.     Cranial Nerves: Cranial nerves 2-12 are intact.     Sensory: Sensation is intact.     Motor: Motor function is intact. No weakness.     Coordination: Coordination is intact.  Psychiatric:        Mood and Affect: Mood normal.        Behavior: Behavior normal.     ED Results / Procedures / Treatments   Labs (all labs ordered are listed, but only abnormal results are displayed) Labs Reviewed  LACTIC ACID, PLASMA  LACTIC ACID, PLASMA  COMPREHENSIVE METABOLIC PANEL  CBC WITH DIFFERENTIAL/PLATELET   EKG None  Radiology No results found.  Procedures Procedures   Medications Ordered in ED Medications - No data to display  ED Course/ Medical Decision Making/ A&P  Medical Decision Making 82 year old male with 13-monthhistory of intermittent left leg swelling.  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 S/p ortho surgery in October, however was previously back to baseline activity prior to swelling onset. Will obtain  DVT [place] to r/o DVT. Ordering CT abd/pelv to r/o mass effect given advanced age, low abdominal pain, and nonspecific back pain complaints. Ordered CBC, CMP, and lactic acid.   2:45 pm: Awaiting results of [place] CT abdomen and pelvis. CBC and CMP show slight increase in creatinine, not to level of AKI. Lactic normal. Ordered small 500 mL NS bolus x1. Will give report to oncoming team.   Amount and/or Complexity of Data Reviewed Labs: ordered.    Details: CBC, CMP, lactic acid.  Radiology: ordered.    Details: DVT [place] CT abdomen pelvis   Final Clinical Impression(s) / ED Diagnoses Final diagnoses:  None   Rx / DC Orders ED Discharge Orders     None      CEzequiel Essex MD   LEzequiel Essex MD 10/16/22 1500    LMargette Fast MD 10/25/22 1032

## 2022-10-16 NOTE — Discharge Instructions (Signed)
You are seen in the emergency room today with left leg pain and swelling.  There is no evidence of blood clot in your leg.  Please continue to elevate the extremity to reduce swelling.  You may also use compression to decrease swelling.  Please discuss further with your primary care physician.  We did obtain a CT scan of your belly as well and this came back looking normal.  Please return with any new or suddenly worsening symptoms.

## 2022-10-17 ENCOUNTER — Ambulatory Visit: Payer: Medicare HMO

## 2022-10-17 DIAGNOSIS — M25662 Stiffness of left knee, not elsewhere classified: Secondary | ICD-10-CM | POA: Diagnosis not present

## 2022-10-17 DIAGNOSIS — R296 Repeated falls: Secondary | ICD-10-CM | POA: Diagnosis not present

## 2022-10-17 DIAGNOSIS — R2689 Other abnormalities of gait and mobility: Secondary | ICD-10-CM | POA: Diagnosis not present

## 2022-10-17 DIAGNOSIS — M6281 Muscle weakness (generalized): Secondary | ICD-10-CM

## 2022-10-17 DIAGNOSIS — R2681 Unsteadiness on feet: Secondary | ICD-10-CM

## 2022-10-17 DIAGNOSIS — M5459 Other low back pain: Secondary | ICD-10-CM

## 2022-10-17 NOTE — Therapy (Signed)
OUTPATIENT PHYSICAL THERAPY TREATMENT Progress Note Reporting Period 09/06/23 to 10/17/22  See note below for Objective Data and Assessment of Progress/Goals.      Patient Name: [name] Pratt MRN: [id number] DOB:July 02, 1941, 82 y.o., male Today's Date: 10/17/2022   END OF SESSION:  PT End of Session - 10/17/22 1203     Visit Number 10    Number of Visits 27    Date for PT Re-Evaluation 10/31/22    Authorization Type [name]    Authorization Time Period 09/05/22-10/31/22    Authorization - Visit Number 9    Authorization - Number of Visits 10    Progress Note Due on Visit 23    PT Start Time 1058    PT Stop Time 1145    PT Time Calculation (min) 47 min    Activity Tolerance Patient tolerated treatment well    Behavior During Therapy [facility] for tasks assessed/performed                  Past Medical History:  Diagnosis Date   Allergic rhinitis 02/02/2016   Anemia 12/17/2009   Formatting of this note might be different from the original. Anemia  10/1 IMO update   Aortic atherosclerosis ([place]) 03/05/2020   Arthritis    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   Atypical chest pain 11/24/2015   Benign essential hypertension 05/13/2009   Qualifier: Diagnosis of  By: Larwance Sachs of this note might be different from the original. Hypertension   Benign paroxysmal positional vertigo 09/14/2021   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[place]) 07/22/2021   Body mass index (BMI) 25.0-25.9, adult 11/02/2020   Cancer ([place])    skin cancer   Cardiac murmur 07/25/2021   Carotid stenosis    a. Carotid U/S 5/64: LICA < 33%, RICA 29-51%;  b.  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   Cystoid macular edema of both eyes 07/28/2020   Degenerative disc disease, lumbar 06/30/2015   Deviated nasal septum 02/18/2015   Diverticulosis 03/03/2020   DJD (degenerative joint disease) of hip 12/24/2012   Dyspnea 06/12/2011   Emphysema, unspecified ([place]) 03/05/2020   Erectile dysfunction 06/20/2017   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[place]) 02/09/2021   Gait disorder 03/02/2021   GERD (gastroesophageal reflux disease)    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   Qualifier: Diagnosis of  By: Burnett Kanaris     Greater trochanteric pain syndrome 07/22/2021   Hand pain, left 01/16/2022   Hearing loss 02/02/2016   Heme positive stool 02/29/2020   HIATAL HERNIA 05/13/2009   Qualifier: Diagnosis of  By: Burnett Kanaris     High risk medication use 01/18/2012   Hip pain 03/02/2021   History of chicken pox    History of COVID-19 12/05/2021   History of hemorrhoids    History of kidney stones    History of skin cancer 07/22/2021   skin cancer   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  Cardiology - Dr Frances Nickels at [facility] cardiology  ICD-10 cut over   Hypertension    under control   Hypokalemia 07/08/2021   Hypothyroidism    Internal hemorrhoids 03/03/2020   Leg cramps 11/13/2019   Lower GI bleed 12/12/2012   Lumbago of lumbar region with sciatica 10/21/2020   Lumbar radiculopathy 03/02/2021   Lumbar spondylosis 11/02/2020   NEPHROLITHIASIS 05/13/2009   Qualifier: Diagnosis of  By: Burnett Kanaris     Nonspecific abnormal electrocardiogram (ECG) (EKG) 01/06/2013   Numbness of hand 01/24/2022   Onychomycosis 06/30/2015   Osteoarthrosis, hand 06/13/2010   Formatting of this note might be different  from the original. Osteoarthritis Of The Hand  10/1 IMO update   Osteoarthrosis, unspecified whether generalized or localized, pelvic region and thigh 07/25/2010   Formatting of this note might be different from the original. Osteoarthritis Of The Hip   Other abnormal glucose 07/22/2021   Other allergic rhinitis 02/18/2015   Palpitations 07/25/2021   Peripheral neuropathy 06/19/2016   Preventative health care 11/24/2015   Right groin pain 11/29/2012   Right inguinal hernia 05/13/2009   Qualifier: Diagnosis of  By: Burnett Kanaris     RLS (restless legs syndrome) 02/18/2015   Rosacea    Rotator cuff tear 07/27/2013   Situational depression 03/02/2021   Skin lesion 01/16/2022   Sleep apnea with use of continuous positive airway pressure (CPAP) 07/15/2013   CPAP set to  7 cm water,  Residual AHi was 7.8  With no leak.  User time 6 hours.  479 days , not used only 12 days - highly compliant 06-23-13  .    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   Urinary retention 11/26/2020   Ventral hernia 07/08/2012   Vertigo 07/08/2021   Weight loss 03/03/2020   Past Surgical History:  Procedure Laterality Date   ABDOMINAL HERNIA REPAIR  07/15/12   Dr [name]   ANTERIOR CERVICAL DECOMP/DISCECTOMY FUSION N/A 07/09/2020   Procedure: ANTERIOR CERVICAL DECOMPRESSION AND FUSION CERVICAL THREE-FOUR.;  Surgeon: [name], MD;  Location: [place];  Service: Neurosurgery;  Laterality: N/A;  anterior   BROW LIFT  05/07/01   COLONOSCOPY WITH PROPOFOL N/A 04/24/2022   Procedure: COLONOSCOPY WITH PROPOFOL;  Surgeon: Thornton Park, MD;  Location: [place];  Service:  Gastroenterology;  Laterality: N/A;   COLONOSCOPY WITH PROPOFOL N/A 04/26/2022   Procedure: COLONOSCOPY WITH PROPOFOL;  Surgeon: Thornton Park, MD;  Location: [place];  Service: Gastroenterology;  Laterality: N/A;   CYSTOSCOPY WITH URETEROSCOPY AND STENT PLACEMENT Right 01/28/2014   Procedure: CYSTOSCOPY WITH URETEROSCOPY, BASKET RETRIVAL AND  STENT PLACEMENT;  Surgeon: [name], MD;  Location: [facility];  Service: Urology;  Laterality: Right;   epidural injections     multiple procedures   ESOPHAGEAL DILATION  1993 and 1994   multiple times   EYE SURGERY Bilateral 03/25/10, 2012   cataract removal   HEMORRHOID SURGERY  2014   HIATAL HERNIA REPAIR  12/21/93   HOLMIUM LASER APPLICATION Right 8/67/6195   Procedure: HOLMIUM LASER APPLICATION;  Surgeon: [name], MD;  Location: [facility];  Service: Urology;  Laterality: Right;   INGUINAL HERNIA REPAIR Right 07/15/12  Dr [name], x2   JOINT REPLACEMENT  07/25/04   right knee   JOINT REPLACEMENT  07/13/10   left knee   [place]  09/20/06   with intraoperative cholangiogram and right inguinal herniorrhaphy with mesh    LIGAMENT REPAIR Left 07/2013   shoulder   LITHOTRIPSY     x2   [place]   POLYPECTOMY  04/26/2022   Procedure: POLYPECTOMY;  Surgeon: Thornton Park, MD;  Location: [place];  Service: Gastroenterology;;   POSTERIOR CERVICAL FUSION/FORAMINOTOMY N/A 11/24/2020   Procedure: Posterior Cervical Laminectomy Cervical three-four, Cervical four-five with lateral mass fusion;  Surgeon: [name], MD;  Location: [place];  Service: Neurosurgery;  Laterality: N/A;   REFRACTIVE SURGERY Bilateral    Right total hip replacement  4/14   SKIN CANCER DESTRUCTION     nose, ear   TONSILLECTOMY  as child   TOTAL HIP ARTHROPLASTY Left    URETHRAL DILATION  1991   [name]   Patient Active Problem List   Diagnosis Date Noted   Weakness of lower extremity 10/10/2022    Skin tear of right elbow without complication 83/41/9622   Skin tear of right forearm without complication 29/79/8921   Fall 09/18/2022   Iron deficiency anemia 09/18/2022   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   Vitamin D deficiency 08/07/2022   Closed fracture of left hip ([place]) 08/07/2022   Hand weakness 05/02/2022   Acute kidney injury ([place]) 05/02/2022   Adenomatous polyp of ascending colon    Adenomatous polyp of colon    Acute metabolic encephalopathy 19/41/7408   GI bleed 04/21/2022   Vasovagal syncope    Hemorrhagic shock (HCC)    Hematochezia    Irregular heart beat 02/03/2022   Constipation 02/03/2022   Carpal tunnel syndrome 01/24/2022   Numbness of hand 01/24/2022   Ulnar neuropathy 01/24/2022   Skin lesion 01/16/2022   History of COVID-19 12/05/2021   Benign paroxysmal positional vertigo 09/14/2021   Palpitations 07/25/2021   Cardiac murmur 07/25/2021   History of chicken pox 07/22/2021   History of hemorrhoids 07/22/2021   Hypertension 07/22/2021   COPD (chronic obstructive pulmonary disease) ([place]) 07/22/2021   History of kidney stones 07/22/2021   History of skin cancer 14/48/1856   Complication of anesthesia 07/22/2021   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[place]) 07/22/2021   Other abnormal glucose 07/22/2021   Spondylitis ([place]) 07/22/2021   Greater trochanteric pain syndrome 07/22/2021   Vertigo 07/08/2021   Hypokalemia 07/08/2021   Trochanteric bursitis of left hip 06/30/2021   Cancer ([place]) 04/11/2021   Lumbar radiculopathy 03/02/2021   Gait instability 03/02/2021   Hip pain 03/02/2021   Situational depression 03/02/2021   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[place]) 02/09/2021   Urinary retention 11/26/2020   Cervical myelopathy ([place]) 11/24/2020   Body mass index (BMI) 25.0-25.9, adult 11/02/2020   Lumbar spondylosis 11/02/2020   Lumbago of lumbar region with sciatica 10/21/2020   Cystoid macular edema  of both eyes 07/28/2020   Status post cervical spinal fusion 07/09/2020   History of total bilateral knee replacement 07/08/2020   Aortic atherosclerosis ([place]) 03/05/2020   Emphysema, unspecified ([place]) 03/05/2020   Arthritis 03/04/2020   Diverticulosis 03/03/2020   Internal hemorrhoids 03/03/2020   Weight loss 03/03/2020   Heme positive stool 02/29/2020   Leg cramps 11/13/2019   Tibialis anterior tendon tear, nontraumatic 11/13/2019   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  07/29/2018   CPAP (continuous positive airway pressure) dependence 03/25/2018   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   Erectile dysfunction 06/20/2017   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 03/06/2017   Peripheral neuropathy 06/19/2016   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   Hearing loss 02/02/2016   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   Allergic rhinitis 02/02/2016   Atypical chest pain 11/24/2015   Preventative health care 11/24/2015   Onychomycosis 06/30/2015   Degenerative disc disease, lumbar 06/30/2015   Other allergic rhinitis 02/18/2015   Deviated nasal septum 02/18/2015   RLS (restless legs syndrome) 02/18/2015   GERD (gastroesophageal reflux disease) 09/18/2014   Hyperglycemia 03/03/2014   Rotator cuff tear 07/27/2013   Sleep apnea with use of continuous positive airway pressure (CPAP) 07/15/2013   Carotid stenosis 02/24/2013   Nonspecific abnormal electrocardiogram (ECG) (EKG) 01/06/2013   DJD (degenerative joint disease) of hip 12/24/2012   Lower GI bleed 12/12/2012   Right groin pain 11/29/2012   OSA (obstructive sleep apnea) 11/25/2012   Ventral hernia 07/08/2012   High risk medication use 01/18/2012   Dyspnea 06/12/2011   Hyperlipidemia with target LDL less than 130 12/27/2010   Osteoarthrosis, unspecified whether generalized or localized, pelvic region and thigh 07/25/2010   Osteoarthrosis, hand 06/13/2010   Anemia 12/17/2009    Hypothyroidism 05/13/2009   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   Benign essential hypertension 05/13/2009   Right inguinal hernia 05/13/2009   HIATAL HERNIA 05/13/2009   NEPHROLITHIASIS 05/13/2009   ROSACEA 05/13/2009    PCP: [name], NP   REFERRING PROVIDER: [name], NP   REFERRING DIAG: [phone] (ICD-10-CM) - S/P revision of total hip  PT eval and treat for aggressive left knee ROM, weight bearing as tolerated, balance/gait training. S/p revision of left hip replacement at [facility]. (Dr. Linton Rump). No precautions.  THERAPY DIAG:  Stiffness of left knee, not elsewhere classified  Muscle weakness (generalized)  Other abnormalities of gait and mobility  RATIONALE FOR EVALUATION AND TREATMENT: Rehabilitation  ONSET DATE: 07/02/22  NEXT MD VISIT:  10/23/22   SUBJECTIVE:                                                                                                                                                                                                         SUBJECTIVE STATEMENT:  "Went to ED yesterday for swelling in LLE, they gave a compression hose but it's too big, they didn't find any blood clots, but it's still sore on the side of the leg." PAIN: PAIN:  Are you having pain? Yes: NPRS scale:  8/10 Pain location: low back and groin Pain description: ache Aggravating factors: movement Relieving factors: rest     PERTINENT HISTORY:  Revision of femoral component of L THA on 07/02/22 s/p fall with periprosthetic fracture; Aortic atherosclerosis; carotid stenosis; cervical myelopathy s/p ACDF 06/2020 and posterior cervical fusion 11/2020; peripheral neuropathy; lumbar DDD/spondylosis; lumbago with sciatica/lumbar radiculopathy; OA; B TKA; BTHA; L greater trochanteric pain syndrome; L RCR 2014; recent CTR surgery - early May; hearing loss; HTN; BPPV/vertigo; B sacroiliitis; skin cancer; COPD/emphysema; sleep apnea with CPAP; GERD; gout; HLD; LE cramps; RLS   PRECAUTIONS:  Fall  WEIGHT BEARING RESTRICTIONS: No - FWB on left as of 08/18/22  FALLS:  Has patient fallen in last 6 months? Yes. 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 The proximal femur fracture looks healed as I cannot see the fracture line in any view. Good leg lengths.  Right hip replacement is cementless and looks well-fixed with a little varus of the tibial stem.  No bony lesion or arthritic change or other abnormality except some lumbar disc degeneration   07/21/2022 - ORTHO XR PELVIS 2 OR 3 VW UNILATERAL HIP: FINDINGS: AP and lateral views of left hip were obtained and interpreted.  Status post new left total hip revision with wire fixation appearing in good position. Good limb length. No new fracture, bony lesion, or other abnormal findings.  07/21/2022 - ORTHO XR FEMUR 2 VW UNILATERAL: FINDINGS: 3 views of the left femur were obtained and interpreted. Status post new left total hip revision with wire fixation in good position. Previous left total knee arthroplasty. No new fracture, bony lesion, or other abnormal findings.   07/02/2022 -  DG HIP (WITH OR WITHOUT PELVIS) 2-3V LEFT :  FINDINGS: Three fluoroscopic spot views of the hip obtained in the operating room. Cerclage wire fixation of periprosthetic fracture, in improved alignment. Fluoroscopy time 12 seconds. Dose 1.23 mGy. IMPRESSION: Fluoroscopic spot views of the hip during periprosthetic fracture fixation.   07/01/2022 - XR THIGH/FEMUR LEFT 2V & XR PELVIS LIMITED 1 OR 2 VIEWS AP: FINDINGS: Degenerative changes in the lower lumbar spine. Pelvis is intact. SI joints and symphysis pubis are not displaced. Right total hip arthroplasty with non cemented components. Tip of the arthroplasty is not included within the field of view. Visualized components appear well seated. Left total hip arthroplasty. The left hip demonstrates an acute periprosthetic fracture of the proximal femur with lateral displacement and mild distraction of the distal fracture fragment. No evidence of dislocation of the prosthesis. The midshaft and distal left femur are intact. Postoperative left knee arthroplasty.  IMPRESSION: 1. Left hip arthroplasty with periprosthetic fracture of the proximal femur. 2. Incidental note of right hip and left knee arthroplasties.   PATIENT SURVEYS:  LEFS 19 / 80 = 23.8 %  COGNITION: Overall cognitive status: Within functional limits for tasks assessed    SENSATION: Peripheral neuropathy in B feet    EDEMA:  Mild to moderate edema in distal B LE, L>R  MUSCLE LENGTH: (09/07/22) Hamstrings: mod tight R>L ITB: mod tight B Piriformis: mod/severe tight B Hip flexors:  Quads:  Heelcord:   POSTURE:  rounded shoulders, forward head, flexed trunk , and weight shift right  PALPATION: Increased muscle tension throughout left quads and IT band as well as hamstrings  LOWER EXTREMITY ROM:  Active ROM Right eval Left eval Left 2/1 and 2/6  Hip flexion     Hip extension     Hip abduction     Hip adduction     Hip internal rotation     Hip external rotation     Knee  flexion 115 92 98  Knee extension 0 0   Ankle dorsiflexion  limited   Ankle plantarflexion     Ankle inversion     Ankle eversion      Passive ROM Left eval  Knee flexion 95  (Blank rows = not tested)  LOWER EXTREMITY MMT:  MMT Right Eval* Left Eval* Right 2/1 Left 2/1 Right 10/17/22 Left 10/17/22  Hip flexion 3+ 3+ 3+ 3+ 3+ 3+  Hip extension 4- 4- 4- 4-    Hip abduction 4 4- 4 4- 4 4  Hip adduction 4 4 4+ 4+ 4+ 4+  Hip internal rotation        Hip external rotation        Knee flexion '4 4 4 '$ 4- 4- 4-  Knee extension 4+ 4+ 4+ 4+ 4 4  Ankle dorsiflexion 4+ 4      Ankle plantarflexion        Ankle inversion        Ankle eversion         (Blank rows = not tested, * - tested in sitting)  FUNCTIONAL TESTS:  5 times sit to stand: 14.22 sec (09/07/22) Timed up and go (TUG): 21.28 sec with SPC 10 meter walk test: 19.94 sec with SPC; Gait speed = 1.63 ft/sec Berg Balance Scale: 38/56; 37-45 significant risk for falls (>80%)  (09/07/22) Dynamic Gait Index: 10/24; Scores of 19 or less are predicitve of falls in older community living adults (09/07/22)  PLOF (As of 06/20/22): ABC scale: 730 / 1600 = 45.6% Berg: 48/56; 46-51 moderate fall risk (>50%)  10MWT = 11.50 sec with SPC; 10.06 sec w/o AD Gait speed: 2.85 ft/sec with SPC; 3.26 ft/sec w/o AD DGI: 18/24; Scores of 19 or less are predictive of falls in older community living adults  GAIT: Distance walked: 60 ft Assistive device utilized: Single point cane Level of assistance: SBA Gait pattern: step to pattern, step through pattern, decreased step length- Right, decreased stance time- Left, decreased hip/knee flexion- Right, and decreased hip/knee flexion- Left Comments: Inconsistent step length on right creating a barrier to versus step through pattern.  Notable gait instability with SPC.   TODAY'S TREATMENT:  10/17/22 Therapeutic Exercise: to improve strength and mobility.  Demo, verbal and tactile cues throughout for  technique.  Nustep L5x40mn Therapeutic Activity: to improve functional performance. Assessed:  LE strength Knee ROM Gait with LRAD Stairs   [facility] Adult PT Treatment:  DATE: 10/12/22 Therapeutic Exercise: Nustep L5 x 6 min Mini squat 2 x 10 Seated LAQ red TB 2 x 10 Standing HS curl red TB 2 x 10 Standing hip abd red TB 2 x 10 Heel raises 2 x 10 Tandem stance 2 x 30 sec Sit to stand Rt foot on 2'' step focus on LT LE wt bearing 3 x 5   10/10/22 THERAPEUTIC EXERCISE: to improve flexibility, strength and mobility.  Verbal and tactile cues throughout for technique.  NuStep - L5 x  6 min Seated Hip Hinge HS x5 10" hold bilat  Seated Knee Flexion RTB 2x10  Standing Hip Flexion RTB 2x10 bilat  Standing alt HS curl 2x 10 bil--increased difficulty with L leg bringing it back   NEUROMUSCULAR RE-EDUCATION: To improve posture, balance, proprioception, coordination, and reduce fall risk. Tandem Stance EO 2x30"    10/05/22 THERAPEUTIC EXERCISE: to improve flexibility, strength and mobility.  Verbal and tactile cues throughout for technique.  NuStep - L5 x  6 min Standing hip flexor stretch with forearms against wall and hip drop forward 2x 30 sec. Standing heel raises 2x 10  Mini squat 2x10 Standing hip ABD x 20 bil Bridge x 10 for strength and hip flexor stretch Supine hip IR x 10 bil Supine HS and gastroc stretch with strap bil 2x30 sec  Sit to stand 3x5 with focus on WB through L LE; used 2 inch step under R and R toe touch as cues  MANUAL THERAPY: Hooklying Hip flexor release L side only with active hip flexion x 20 Passive stretch to bil piriformis in hooklying x 30 sec ea    10/03/22 THERAPEUTIC EXERCISE: to improve flexibility, strength and mobility.  Verbal and tactile cues throughout for technique.  NuStep - L5 x  5 min Standing hip flexor stretch with forearms against wall and hip drop forward x 1 min. TUG assessed Standing  heel raises straight and toes out x 10 ea Mini squat 2x10 Standing hip ABD x 20 bil Supine feet on peanut: SKTC bil x 15 sec, then piriformis strech 2x 30 sec ea R side, unable to do on left so did in sitting with foot on bolster x 30 sec. Seated hip IR with ball between knees and sitting on foam x 20 Seated HS stretch with strap on R to avoid flexion of trunk x 1 min  MANUAL THERAPY: Hooklying Hip flexor release L side only with active hip flexion x 20   PATIENT EDUCATION:  Education details: HEP update  Person educated: Patient Education method: Explanation, Demonstration, Verbal cues, and Handouts Education comprehension: verbalized understanding, returned demonstration, and verbal cues required  HOME EXERCISE PROGRAM: Access Code: [email or web address] URL: [email or web address] Date: 10/03/2022 Prepared by: Almyra Free  Exercises - Supine Heel Slide with Strap  - 2 x daily - 7 x weekly - 2 sets - 10 reps - 3 sec hold - Hooklying Single Knee to Chest Stretch  - 2 x daily - 7 x weekly - 3 reps - 30 sec hold - Seated Hamstring Curls with Resistance  - 1 x daily - 7 x weekly - 2 sets - 10 reps - 3 sec hold - Seated Knee Extension with Resistance  - 1 x daily - 7 x weekly - 2 sets - 10 reps - Standing March with Counter Support  - 1 x daily - 7 x weekly - 2 sets - 10 reps - Seated Hamstring Stretch  - 2 x daily - 7 x weekly - 3  reps - 30 sec hold - Standing ITB Stretch  - 2 x daily - 7 x weekly - 3 reps - 30 sec hold - Standing Hip Flexor Stretch  - 2 x daily - 7 x weekly - 2 sets - 10 reps - 30 sec hold - Seated Hip Internal Rotation AROM (Mirrored)  - 1 x daily - 7 x weekly - 3 sets - 10 reps  ASSESSMENT:  CLINICAL IMPRESSION:  Pt expressed concern about swelling in LLE, which he went to ED yesterday, had test for blood clots, which were negative but he did received compression hose to wear for edema. Pt demonstrates mild improvement in LE strength, however still globally weak. L  knee ROM remains about the same but also limited by pain. He was able to complete stairs with 1HR however resulting to step to pattern naturally. He appeared unsteady with a cane so we were unable to assess LTG #5. Pt is progressing slowly toward goals, has yet to meet any LTGs but would continue to benefit from skilled therapy to improve functional performance and restore PLOF.    OBJECTIVE IMPAIRMENTS: Abnormal gait, decreased activity tolerance, decreased balance, decreased coordination, decreased endurance, decreased knowledge of condition, decreased knowledge of use of DME, decreased mobility, difficulty walking, decreased ROM, decreased strength, decreased safety awareness, increased fascial restrictions, impaired perceived functional ability, impaired flexibility, impaired sensation, improper body mechanics, postural dysfunction, and pain.   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   REHAB POTENTIAL: Good  CLINICAL DECISION MAKING: Evolving/moderate complexity  EVALUATION COMPLEXITY: Moderate   GOALS: Goals reviewed with patient?  Yes  SHORT TERM GOALS: Target date: 10/03/2022   Patient will be independent with initial HEP. Baseline:  Goal status: MET  2.  Complete balance testing and establish additional LTGs as appropriate. Baseline:  Goal status: MET 09/07/22  3.  Patient will demonstrate decreased TUG time to </= 16 sec to decrease risk for falls with transitional mobility Baseline: 21.28 sec, 10/03/22 28.23 sec Goal status: IN PROGRESS  LONG TERM GOALS: Target date: 10/31/2022   Patient will be independent with advanced/ongoing HEP to improve outcomes and carryover.  Baseline:  Goal status: IN PROGRESS  2.  Patient will report at least 50-75% improvement in low back and Lhip pain to improve QOL. Baseline: 7/10 Goal status: IN PROGRESS  3.  Patient will demonstrate improved L knee AROM to [facility] to allow for normal gait and stair mechanics. Baseline: AROM 0-92, PROM 0-95 Goal status: IN PROGRESS- 10/17/22  4.  Patient will demonstrate improved B LE strength to >/= 4 to 4+/5 for improved stability and ease of mobility. Baseline:  Goal status: IN PROGRESS- 10/17/22  5.  Patient will be able to ambulate 600' with LRAD and normal gait pattern, good stability and without increased pain to safely access community.  Baseline:  Goal status: IN  PROGRESS  6. Patient will be able to ascend/descend stairs with 1 HR and reciprocal step pattern safely to access home and community.  Baseline:  Goal status: IN PROGRESS- 10/17/22  7.  Patient will report >/=28/80 on LEFS (patient reported outcome measure) to demonstrate improved functional ability. Baseline: 19 / 80 = 23.8 % Goal status: IN PROGRESS  8.  Patient will demonstrate at least 19/24 on DGI to decrease risk of falls. Baseline: 10/24 (09/07/22) Goal status: IN PROGRESS   9.  Patient will improve Berg score by at least 8 points to improve safety and stability with ADLs in standing and reduce risk for falls Baseline: 38/56 (09/07/22) Goal status: IN  PROGRESS   PLAN:  PT FREQUENCY: 2x/week  PT DURATION: 8 weeks  PLANNED INTERVENTIONS: Therapeutic exercises, Therapeutic activity, Neuromuscular re-education, Balance training, Gait training, Patient/Family education, Self Care, Joint mobilization, Stair training, DME instructions, Dry Needling, Electrical stimulation, Cryotherapy, Moist heat, scar mobilization, Taping, Ultrasound, Ionotophoresis '4mg'$ /ml Dexamethasone, Manual therapy, and Re-evaluation  PLAN FOR NEXT SESSION: review and update HEP, continue with LE flexibility and ROM exercises; pt permitting, work on balance and strengthening exercises.     Artist Pais, PTA 10/17/2022, 12:04 PM

## 2022-10-18 NOTE — Addendum Note (Signed)
Addended by: Percival Spanish on: 10/18/2022 08:42 AM   Modules accepted: Orders

## 2022-10-19 ENCOUNTER — Telehealth: Payer: Self-pay | Admitting: Family

## 2022-10-19 DIAGNOSIS — L089 Local infection of the skin and subcutaneous tissue, unspecified: Secondary | ICD-10-CM | POA: Diagnosis not present

## 2022-10-19 DIAGNOSIS — Z6825 Body mass index (BMI) 25.0-25.9, adult: Secondary | ICD-10-CM | POA: Diagnosis not present

## 2022-10-19 DIAGNOSIS — L929 Granulomatous disorder of the skin and subcutaneous tissue, unspecified: Secondary | ICD-10-CM | POA: Diagnosis not present

## 2022-10-19 DIAGNOSIS — M48062 Spinal stenosis, lumbar region with neurogenic claudication: Secondary | ICD-10-CM | POA: Diagnosis not present

## 2022-10-19 DIAGNOSIS — M5440 Lumbago with sciatica, unspecified side: Secondary | ICD-10-CM | POA: Diagnosis not present

## 2022-10-19 NOTE — Telephone Encounter (Signed)
[facility] is requesting pt's med list so they can get him started on an antibiotic. They are hoping to get this done within the hour. [phone] attn [place]

## 2022-10-19 NOTE — Telephone Encounter (Signed)
Medication list faxed to number provided.

## 2022-10-20 ENCOUNTER — Telehealth: Payer: Self-pay

## 2022-10-20 ENCOUNTER — Ambulatory Visit: Payer: Medicare HMO | Admitting: Physical Therapy

## 2022-10-20 NOTE — Telephone Encounter (Signed)
Caller Name [place] Phone Number [phone] Patient Name [name] Pratt Patient DOB 10-Aug-1941 Call Type Message Only Information Provided Reason for Call Request for General Office Information Initial Comment Caller states he is wondering if the doctor has heard back from [facility] regarding an MRI he is to have scheduled. Additional Comment Caller states he would like to know if the MRI has been scheduled for him. Disp. Time Disposition Final User 10/20/2022 2:47:42 PM General Information Provided Yes Dingus, Melinda Call Closed By: Lesia Hausen Transaction Date/Time: 10/20/2022 2:42:48 PM (ET)

## 2022-10-23 ENCOUNTER — Ambulatory Visit (INDEPENDENT_AMBULATORY_CARE_PROVIDER_SITE_OTHER): Payer: Medicare HMO | Admitting: Family

## 2022-10-23 VITALS — BP 114/50 | HR 74 | Temp 97.7°F | Resp 16 | Wt 159.0 lb

## 2022-10-23 DIAGNOSIS — S81812A Laceration without foreign body, left lower leg, initial encounter: Secondary | ICD-10-CM

## 2022-10-23 DIAGNOSIS — E039 Hypothyroidism, unspecified: Secondary | ICD-10-CM | POA: Diagnosis not present

## 2022-10-23 DIAGNOSIS — S51011D Laceration without foreign body of right elbow, subsequent encounter: Secondary | ICD-10-CM | POA: Diagnosis not present

## 2022-10-23 DIAGNOSIS — S51811D Laceration without foreign body of right forearm, subsequent encounter: Secondary | ICD-10-CM

## 2022-10-23 DIAGNOSIS — I10 Essential (primary) hypertension: Secondary | ICD-10-CM | POA: Diagnosis not present

## 2022-10-23 DIAGNOSIS — R739 Hyperglycemia, unspecified: Secondary | ICD-10-CM | POA: Diagnosis not present

## 2022-10-23 DIAGNOSIS — M1A0720 Idiopathic chronic gout, left ankle and foot, without tophus (tophi): Secondary | ICD-10-CM

## 2022-10-23 NOTE — Assessment & Plan Note (Signed)
States he is finishing some doxycycline given by ED. Wound looks good. Antibiotic ointment applied and wound was redressed.

## 2022-10-23 NOTE — Progress Notes (Signed)
Subjective:     Patient ID: [name] Pratt, male    DOB: 1941-01-29, 82 y.o.   MRN: [id number]  Chief Complaint  Patient presents with   Hypertension    Here for follow up   Follow-up    ED follow up    HPI Patient is in today for follow up.  HTN-  BP Readings from Last 3 Encounters:  10/23/22 (!) 114/50  10/16/22 129/67  10/10/22 (!) 139/58   Pt was seen in the ED on 10/16/21 due to bilateral LE edema L>R.  He did have L Total Hip revision 07/02/22. [place] LLE was negative for DVT.  He feels that the swelling is improved.  He was not given any antibiotics.     He did have a CT abd/pelvis performed during that visit as well which noted no acute findings.   He does have a skin tear on his left lower extremity which his wife has been dressing. Skin tear on right forearm is reportedly improving.    Health Maintenance Due  Topic Date Due   FOOT EXAM  09/29/2021   COVID-19 Vaccine (5 - 2023-24 season) 05/12/2022    Past Medical History:  Diagnosis Date   Allergic rhinitis 02/02/2016   Anemia 12/17/2009   Formatting of this note might be different from the original. Anemia  10/1 IMO update   Aortic atherosclerosis ([place]) 03/05/2020   Arthritis    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   Atypical chest pain 11/24/2015   Benign essential hypertension 05/13/2009   Qualifier: Diagnosis of  By: Larwance Sachs of this note might be different from the original. Hypertension   Benign paroxysmal positional vertigo 09/14/2021   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[place]) 07/22/2021   Body mass index (BMI) 25.0-25.9, adult 11/02/2020   Cancer ([place])    skin cancer   Cardiac murmur 07/25/2021   Carotid stenosis    a. Carotid U/S [id number]: LICA < [id number], RICA 0000000;  b.  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   Cystoid macular edema of both eyes 07/28/2020   Degenerative disc disease, lumbar 06/30/2015   Deviated nasal septum 02/18/2015   Diverticulosis 03/03/2020   DJD (degenerative joint disease) of hip 12/24/2012   Dyspnea 06/12/2011   Emphysema, unspecified ([place]) 03/05/2020   Erectile dysfunction 06/20/2017   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[place]) 02/09/2021   Gait disorder 03/02/2021   GERD (gastroesophageal reflux disease)    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   Qualifier: Diagnosis of  By: Burnett Kanaris     Greater trochanteric pain syndrome 07/22/2021   Hand pain, left 01/16/2022   Hearing loss 02/02/2016   Heme positive stool 02/29/2020   HIATAL HERNIA 05/13/2009   Qualifier: Diagnosis of  By: Burnett Kanaris     High risk medication use 01/18/2012   Hip pain 03/02/2021   History of chicken pox    History of COVID-19 12/05/2021   History of hemorrhoids    History of kidney stones    History of skin cancer 07/22/2021   skin cancer   History of total bilateral knee replacement  07/08/2020   Hyperglycemia 03/03/2014   Hyperlipidemia with target LDL less than 130 12/27/2010   Formatting of this note might be different from the original. Cardiology - Dr Frances Nickels at [facility]  ICD-10 cut over   Hypertension    under control   Hypokalemia 07/08/2021   Hypothyroidism    Internal hemorrhoids 03/03/2020   Leg cramps 11/13/2019   Lower GI bleed 12/12/2012   Lumbago of lumbar region with sciatica 10/21/2020   Lumbar radiculopathy 03/02/2021   Lumbar spondylosis 11/02/2020   NEPHROLITHIASIS 05/13/2009   Qualifier: Diagnosis of  By: Burnett Kanaris     Nonspecific abnormal electrocardiogram (ECG) (EKG) 01/06/2013   Numbness  of hand 01/24/2022   Onychomycosis 06/30/2015   Osteoarthrosis, 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 Osteoarthritis Of The Hand  10/1 IMO update   Osteoarthrosis, unspecified whether generalized or localized, pelvic region and thigh 07/25/2010   Formatting of this note might be different from the original. Osteoarthritis Of The Hip   Other abnormal glucose 07/22/2021   Other allergic rhinitis 02/18/2015   Palpitations 07/25/2021   Peripheral neuropathy 06/19/2016   Preventative health care 11/24/2015   Right groin pain 11/29/2012   Right inguinal hernia 05/13/2009   Qualifier: Diagnosis of  By: Burnett Kanaris     RLS (restless legs syndrome) 02/18/2015   Rosacea    Rotator cuff tear 07/27/2013   Situational depression 03/02/2021   Skin lesion 01/16/2022   Sleep apnea with use of continuous positive airway pressure (CPAP) 07/15/2013   CPAP set to  7 cm water,  Residual AHi was 7.8  With no leak.  User time 6 hours.  479 days , not used only 12 days - highly compliant 06-23-13  .    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   Urinary retention 11/26/2020   Ventral hernia 07/08/2012   Vertigo 07/08/2021   Weight loss 03/03/2020    Past Surgical History:  Procedure Laterality Date   ABDOMINAL HERNIA REPAIR  07/15/12   Dr [name]   ANTERIOR CERVICAL DECOMP/DISCECTOMY FUSION N/A 07/09/2020   Procedure: ANTERIOR CERVICAL DECOMPRESSION AND FUSION CERVICAL THREE-FOUR.;  Surgeon: [name], MD;  Location: [place];  Service: Neurosurgery;  Laterality: N/A;  anterior   BROW LIFT  05/07/01   COLONOSCOPY WITH PROPOFOL N/A  04/24/2022   Procedure: COLONOSCOPY WITH PROPOFOL;  Surgeon: Thornton Park, MD;  Location: [place];  Service: Gastroenterology;  Laterality: N/A;   COLONOSCOPY WITH PROPOFOL N/A 04/26/2022   Procedure: COLONOSCOPY WITH PROPOFOL;  Surgeon: Thornton Park, MD;  Location: [place];  Service: Gastroenterology;  Laterality: N/A;   CYSTOSCOPY WITH URETEROSCOPY AND STENT PLACEMENT Right 01/28/2014   Procedure: CYSTOSCOPY WITH URETEROSCOPY, BASKET RETRIVAL AND  STENT PLACEMENT;  Surgeon: [name], MD;  Location: [facility];  Service: Urology;  Laterality: Right;   epidural injections     multiple procedures   ESOPHAGEAL DILATION  1993 and 1994   multiple times   EYE SURGERY Bilateral 03/25/10, 2012   cataract removal   HEMORRHOID SURGERY  2014   HIATAL HERNIA REPAIR  12/21/93   HOLMIUM LASER APPLICATION Right [id number]   Procedure:  HOLMIUM LASER APPLICATION;  Surgeon: [name], MD;  Location: [facility];  Service: Urology;  Laterality: Right;   INGUINAL HERNIA REPAIR Right 07/15/12   Dr [name], x2   JOINT REPLACEMENT  07/25/04   right knee   JOINT REPLACEMENT  07/13/10   left knee   [place]  09/20/06   with intraoperative cholangiogram and right inguinal herniorrhaphy with mesh    LIGAMENT REPAIR Left 07/2013   shoulder   LITHOTRIPSY     x2   [place]   POLYPECTOMY  04/26/2022   Procedure: POLYPECTOMY;  Surgeon: Thornton Park, MD;  Location: [place];  Service: Gastroenterology;;   POSTERIOR CERVICAL FUSION/FORAMINOTOMY N/A 11/24/2020   Procedure: Posterior Cervical Laminectomy Cervical three-four, Cervical four-five with lateral mass fusion;  Surgeon: [name], MD;  Location: [place];  Service: Neurosurgery;  Laterality: N/A;   REFRACTIVE SURGERY Bilateral    Right total hip replacement  4/14   SKIN CANCER DESTRUCTION     nose, ear   TONSILLECTOMY  as child   TOTAL HIP ARTHROPLASTY Left    URETHRAL DILATION   1991   [name]    Family History  Problem Relation Age of Onset   Cancer Mother    Hypertension Other    Cancer Other    Osteoarthritis Other    Stomach cancer Neg Hx    Colon cancer Neg Hx    Esophageal cancer Neg Hx     Social History   Socioeconomic History   Marital status: Married    Spouse name: Not on file   Number of children: 1   Years of education: Not on file   Highest education level: Not on file  Occupational History   Occupation: firefighter    Comment: paints on the side   Occupation: retired  Tobacco Use   Smoking status: Former    Years: 11.00    Types: Cigarettes    Quit date: 09/11/1978    Years since quitting: 44.1   Smokeless tobacco: Never  Vaping Use   Vaping Use: Never used  Substance and Sexual Activity   Alcohol use: Not Currently    Alcohol/week: 0.0 standard drinks of alcohol    Comment: rare   Drug use: No   Sexual activity: Not Currently  Other Topics Concern   Not on file  Social History Narrative   Lives with his wife   He has one son- lives locally.   2 grandchildren   Has worked for Research officer, trade union   Enjoys wood working   Completed HS.  [employer]   Social Determinants of Health   Financial Resource Strain: Low Risk  (10/28/2021)   Overall Financial Resource Strain (CARDIA)    Difficulty of Paying Living Expenses: Not very hard  Food Insecurity: No Food Insecurity (06/05/2022)   Hunger Vital Sign    Worried About Running Out of Food in the Last Year: Never true    Ran Out of Food in the Last Year: Never true  Transportation Needs: Unknown (06/05/2022)   PRAPARE - Hydrologist (Medical): No    Lack of Transportation (Non-Medical): Not on file  Physical Activity: Insufficiently Active (10/28/2021)   Exercise Vital Sign    Days of Exercise per Week: 2 days    Minutes of Exercise per Session: 30 min  Stress: No Stress Concern Present (01/19/2022)   [place]  Stress Questionnaire    Feeling of Stress : Only a little  Social Connections: Moderately Isolated (01/19/2022)   Social Connection and Isolation Panel [NHANES]    Frequency of Communication with Friends and Family: More than three times a week    Frequency of Social Gatherings with Friends and Family: More than three times a week    Attends Religious Services: Never    Marine scientist or Organizations: No    Attends Archivist Meetings: Never    Marital Status: Married  Human resources officer Violence: Not At Risk (01/19/2022)   Humiliation, Afraid, Rape, and Kick questionnaire    Fear of Current or Ex-Partner: No    Emotionally Abused: No    Physically Abused: No    Sexually Abused: No    Outpatient Medications Prior to Visit  Medication Sig Dispense Refill   doxycycline (VIBRAMYCIN) 100 MG capsule Take 100 mg by mouth 2 (two) times daily.     acetaminophen (TYLENOL) 650 MG CR tablet Take 650-1,300 mg by mouth every 8 (eight) hours as needed for pain.     albuterol (VENTOLIN HFA) 108 (90 Base) MCG/ACT inhaler INHALE 2 PUFFS INTO THE LUNGS EVERY 6 HOURS AS NEEDED FOR SHORTNESS OF BREATH. (Patient taking differently: Inhale 2 puffs into the lungs every 6 (six) hours as needed for shortness of breath.) 1 each 3   allopurinol (ZYLOPRIM) 100 MG tablet Take 1 tablet (100 mg total) by mouth 2 (two) times daily. 180 tablet 1   amLODipine (NORVASC) 5 MG tablet Take 1.5 tablets (7.5 mg total) by mouth daily. 90 tablet 1   antiseptic oral rinse (BIOTENE) LIQD 15 mLs by Mouth Rinse route in the morning.     aspirin EC 81 MG tablet Take 81 mg by mouth at bedtime.     bacitracin ointment Apply 1 Application topically 2 (two) times daily. 120 g 0   cycloSPORINE (RESTASIS) 0.05 % ophthalmic emulsion Place 1 drop into both eyes 2 (two) times daily.     diclofenac Sodium (VOLTAREN) 1 % GEL Apply 2 g topically daily as needed (for arthritic pain in the hands).     docusate sodium  (COLACE) 100 MG capsule Take 1 capsule (100 mg total) by mouth 2 (two) times daily. 60 capsule 11   gabapentin (NEURONTIN) 300 MG capsule Take 1 capsule by mouth at bedtime.     GEMTESA 75 MG TABS Take 75 mg by mouth daily.     glucose blood test strip TRUE Metrix Blood Glucose Test Strip, use as instructed 100 each 12   Homeopathic Products (THERAWORX RELIEF) FOAM Apply 1 application topically 2 (two) times daily as needed (Pain).     hydrALAZINE (APRESOLINE) 50 MG tablet Take 1 tablet (50 mg total) by mouth 2 (two) times daily. 180 tablet 1   Iron, Ferrous Sulfate, 325 (65 Fe) MG TABS Take 325 mg by mouth every other day. 30 tablet 0   Lancets MISC 1 each by Does not apply route 2 (two) times daily. TRUE matrix lancets 100 each 3   levothyroxine (SYNTHROID) 150 MCG tablet Take 150 mcg 6 days a week (patient takes 75 mcg once a week) Disp #84 for 90 day supply 84 tablet 1   levothyroxine (SYNTHROID) 75 MCG tablet Take 75 mcg once a week (patient on 150 mcg 6 days a week) Dispense #24 for 90 day supply. 12 tablet 1   loratadine (CLARITIN) 10 MG tablet Take 10 mg by mouth daily as needed for  allergies or rhinitis.     losartan (COZAAR) 100 MG tablet TAKE 1 TABLET EVERY DAY 90 tablet 1   Magnesium 500 MG TABS Take 500 mg by mouth daily.     Multiple Vitamins-Minerals (MULTIVITAMIN WITH MINERALS) tablet Take 1 tablet by mouth daily.     nitroGLYCERIN (NITROSTAT) 0.4 MG SL tablet Place 1 tablet (0.4 mg total) under the tongue every 5 (five) minutes as needed for chest pain. 25 tablet 6   polyethylene glycol (MIRALAX / GLYCOLAX) 17 g packet Take 17-34 g by mouth daily as needed for mild constipation.     polyvinyl alcohol (ARTIFICIAL TEARS) 1.4 % ophthalmic solution Place 1 drop into both eyes as needed for dry eyes.     PRESCRIPTION MEDICATION CPAP- At bedtime and during any time of rest     Probiotic Product (PROBIOTIC DAILY PO) Take 1 capsule by mouth daily.     rosuvastatin (CRESTOR) 5 MG tablet  Take 1 tablet (5 mg total) by mouth daily. 90 tablet 1   sodium chloride (OCEAN) 0.65 % nasal spray Place 1 spray into both nostrils as needed for congestion.     traMADol (ULTRAM) 50 MG tablet Take 1 tablet (50 mg total) by mouth every 12 (twelve) hours as needed. 10 tablet 0   vitamin B-12 (CYANOCOBALAMIN) 500 MCG tablet Take 500 mcg by mouth daily.     No facility-administered medications prior to visit.    Allergies  Allergen Reactions   Pravastatin Other (See Comments)    Muscle cramps   Sildenafil Other (See Comments)    Tachycardia     Oxycodone Other (See Comments)    Hallucinations    Atenolol Other (See Comments)    Bradycardia    Elastic Bandages & [Zinc] Other (See Comments)    Teflon bandages - Irritation   Metoclopramide Hcl Anxiety    ROS See HPI    Objective:    Physical Exam Constitutional:      General: He is not in acute distress.    Appearance: He is well-developed.  HENT:     Head: Normocephalic and atraumatic.  Cardiovascular:     Rate and Rhythm: Normal rate and regular rhythm.     Heart sounds: No murmur heard. Pulmonary:     Effort: Pulmonary effort is normal. No respiratory distress.     Breath sounds: Normal breath sounds. No wheezing or rales.  Skin:    General: Skin is warm and dry.  Neurological:     Mental Status: He is alert and oriented to person, place, and time.  Psychiatric:        Behavior: Behavior normal.        Thought Content: Thought content normal.       BP (!) 114/50 (BP Location: Right Arm, Patient Position: Sitting, Cuff Size: Small)   Pulse 74   Temp 97.7 F (36.5 C) (Oral)   Resp 16   Wt 159 lb (72.1 kg)   SpO2 97%   BMI 24.90 kg/m  Wt Readings from Last 3 Encounters:  10/23/22 159 lb (72.1 kg)  10/10/22 161 lb (73 kg)  09/18/22 156 lb (70.8 kg)       Assessment & Plan:   Problem List Items Addressed This Visit       Unprioritized   Skin tear of right elbow without complication    Nearly  healed. Continue daily dressing change- antibiotic ointment applied and wound was dressed.       Skin tear of left  lower leg without complication    States he is finishing some doxycycline given by ED. Wound looks good. Antibiotic ointment applied and wound was redressed.      Hypothyroidism    Lab Results  Component Value Date   TSH 0.83 08/07/2022  TSH stable. Continue current dose of synthroid.       Hypertension    Slightly overtreated. Will decrease amlodipine from 7.57m to 524monce daily.      Hyperglycemia    Lab Results  Component Value Date   HGBA1C 5.5 03/07/2022  Last A1C was WNL.  Monitor.       GOUT    No definite gout symptoms, continues allopurinol. Just having OA pain.       Benign essential hypertension - Primary    BP Readings from Last 3 Encounters:  10/23/22 (!) 114/50  10/16/22 129/67  10/10/22 (!) 139/58  BP a bit soft. He has been having increased fatigue.  Will decrease amlodipine from 7.5 to 61m6mnce daily.        I am having [name] Pratt "[name]" maintain his cycloSPORINE, sodium chloride, aspirin EC, acetaminophen, Theraworx Relief, glucose blood, Lancets, diclofenac Sodium, polyethylene glycol, nitroGLYCERIN, Magnesium, albuterol, Gemtesa, traMADol, PRESCRIPTION MEDICATION, vitamin B-12, loratadine, antiseptic oral rinse, docusate sodium, polyvinyl alcohol, Probiotic Product (PROBIOTIC DAILY PO), multivitamin with minerals, allopurinol, hydrALAZINE, levothyroxine, rosuvastatin, levothyroxine, losartan, Iron (Ferrous Sulfate), gabapentin, bacitracin, amLODipine, and doxycycline.  No orders of the defined types were placed in this encounter.

## 2022-10-23 NOTE — Assessment & Plan Note (Signed)
BP Readings from Last 3 Encounters:  10/23/22 (!) 114/50  10/16/22 129/67  10/10/22 (!) 139/58   BP a bit soft. He has been having increased fatigue.  Will decrease amlodipine from 7.5 to 50m once daily.

## 2022-10-23 NOTE — Assessment & Plan Note (Signed)
Lab Results  Component Value Date   TSH 0.83 08/07/2022   TSH stable. Continue current dose of synthroid.

## 2022-10-23 NOTE — Assessment & Plan Note (Addendum)
>>  ASSESSMENT AND PLAN FOR HYPERTENSION WRITTEN ON 10/23/2022  1:22 PM BY O'SULLIVAN, Catheline Hixon, NP  Slightly overtreated. Will decrease amlodipine from 7.5mg  to 5mg  once daily.  >>ASSESSMENT AND PLAN FOR BENIGN ESSENTIAL HYPERTENSION WRITTEN ON 10/23/2022 11:26 AM BY O'SULLIVAN, Romina Divirgilio, NP  BP Readings from Last 3 Encounters:  10/23/22 (!) 114/50  10/16/22 129/67  10/10/22 (!) 139/58   BP a bit soft. He has been having increased fatigue.  Will decrease amlodipine from 7.5 to 5mg  once daily.

## 2022-10-23 NOTE — Assessment & Plan Note (Signed)
Nearly healed. Continue daily dressing change- antibiotic ointment applied and wound was dressed.

## 2022-10-23 NOTE — Assessment & Plan Note (Signed)
Lab Results  Component Value Date   HGBA1C 5.5 03/07/2022   Last A1C was WNL.  Monitor.

## 2022-10-23 NOTE — Assessment & Plan Note (Signed)
No definite gout symptoms, continues allopurinol. Just having OA pain.

## 2022-10-23 NOTE — Telephone Encounter (Signed)
Patient reports his back doctor mentioned he was going to get an MRI on patient. Patient advised to call them about this.

## 2022-10-24 ENCOUNTER — Other Ambulatory Visit (HOSPITAL_BASED_OUTPATIENT_CLINIC_OR_DEPARTMENT_OTHER): Payer: Self-pay | Admitting: Neurosurgery

## 2022-10-24 ENCOUNTER — Ambulatory Visit: Payer: Medicare HMO

## 2022-10-24 DIAGNOSIS — M5459 Other low back pain: Secondary | ICD-10-CM | POA: Diagnosis not present

## 2022-10-24 DIAGNOSIS — R2689 Other abnormalities of gait and mobility: Secondary | ICD-10-CM | POA: Diagnosis not present

## 2022-10-24 DIAGNOSIS — M5440 Lumbago with sciatica, unspecified side: Secondary | ICD-10-CM

## 2022-10-24 DIAGNOSIS — R2681 Unsteadiness on feet: Secondary | ICD-10-CM | POA: Diagnosis not present

## 2022-10-24 DIAGNOSIS — M6281 Muscle weakness (generalized): Secondary | ICD-10-CM

## 2022-10-24 DIAGNOSIS — M25662 Stiffness of left knee, not elsewhere classified: Secondary | ICD-10-CM | POA: Diagnosis not present

## 2022-10-24 DIAGNOSIS — R296 Repeated falls: Secondary | ICD-10-CM

## 2022-10-24 NOTE — Therapy (Signed)
OUTPATIENT PHYSICAL THERAPY TREATMENT       Patient Name: [name] Pratt MRN: [id number] DOB:Nov 05, 1940, 82 y.o., male Today's Date: 10/24/2022   END OF SESSION:  PT End of Session - 10/24/22 1158     Visit Number 11    Number of Visits 22    Date for PT Re-Evaluation 12/01/22    Authorization Type [name]    Authorization Time Period through 12/01/22    Authorization - Visit Number 1    Authorization - Number of Visits 10    Progress Note Due on Visit 23    PT Start Time 1103    PT Stop Time 1150    PT Time Calculation (min) 47 min    Activity Tolerance Patient tolerated treatment well    Behavior During Therapy [facility] for tasks assessed/performed                   Past Medical History:  Diagnosis Date   Allergic rhinitis 02/02/2016   Anemia 12/17/2009   Formatting of this note might be different from the original. Anemia  10/1 IMO update   Aortic atherosclerosis ([place]) 03/05/2020   Arthritis    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   Atypical chest pain 11/24/2015   Benign essential hypertension 05/13/2009   Qualifier: Diagnosis of  By: Larwance Sachs of this note might be different from the original. Hypertension   Benign paroxysmal positional vertigo 09/14/2021   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[place]) 07/22/2021   Body mass index (BMI) 25.0-25.9, adult 11/02/2020   Cancer ([place])    skin cancer   Cardiac murmur 07/25/2021   Carotid stenosis    a. Carotid U/S [id number]: LICA < [id number], RICA 0000000;  b.  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   Cystoid macular edema of both eyes  07/28/2020   Degenerative disc disease, lumbar 06/30/2015   Deviated nasal septum 02/18/2015   Diverticulosis 03/03/2020   DJD (degenerative joint disease) of hip 12/24/2012   Dyspnea 06/12/2011   Emphysema, unspecified ([place]) 03/05/2020   Erectile dysfunction 06/20/2017   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[place]) 02/09/2021   Gait disorder 03/02/2021   GERD (gastroesophageal reflux disease)    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   Qualifier: Diagnosis of  By: Burnett Kanaris     Greater trochanteric pain syndrome 07/22/2021   Hand pain, left 01/16/2022   Hearing loss 02/02/2016   Heme positive stool 02/29/2020   HIATAL HERNIA 05/13/2009   Qualifier: Diagnosis of  By: Burnett Kanaris     High risk medication use 01/18/2012   Hip pain 03/02/2021   History of chicken pox    History of COVID-19 12/05/2021   History of hemorrhoids    History of kidney stones    History of skin cancer 07/22/2021   skin cancer   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 Cardiology - Dr Frances Nickels at [facility] cardiology  ICD-10 cut over   Hypertension  under control   Hypokalemia 07/08/2021   Hypothyroidism    Internal hemorrhoids 03/03/2020   Leg cramps 11/13/2019   Lower GI bleed 12/12/2012   Lumbago of lumbar region with sciatica 10/21/2020   Lumbar radiculopathy 03/02/2021   Lumbar spondylosis 11/02/2020   NEPHROLITHIASIS 05/13/2009   Qualifier: Diagnosis of  By: Burnett Kanaris     Nonspecific abnormal electrocardiogram (ECG) (EKG) 01/06/2013   Numbness of hand 01/24/2022   Onychomycosis 06/30/2015   Osteoarthrosis, 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 Osteoarthritis Of The Hand  10/1 IMO update   Osteoarthrosis, unspecified whether generalized  or localized, pelvic region and thigh 07/25/2010   Formatting of this note might be different from the original. Osteoarthritis Of The Hip   Other abnormal glucose 07/22/2021   Other allergic rhinitis 02/18/2015   Palpitations 07/25/2021   Peripheral neuropathy 06/19/2016   Preventative health care 11/24/2015   Right groin pain 11/29/2012   Right inguinal hernia 05/13/2009   Qualifier: Diagnosis of  By: Burnett Kanaris     RLS (restless legs syndrome) 02/18/2015   Rosacea    Rotator cuff tear 07/27/2013   Situational depression 03/02/2021   Skin lesion 01/16/2022   Sleep apnea with use of continuous positive airway pressure (CPAP) 07/15/2013   CPAP set to  7 cm water,  Residual AHi was 7.8  With no leak.  User time 6 hours.  479 days , not used only 12 days - highly compliant 06-23-13  .    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   Urinary retention 11/26/2020   Ventral hernia 07/08/2012   Vertigo 07/08/2021   Weight loss 03/03/2020   Past Surgical History:  Procedure Laterality Date   ABDOMINAL HERNIA REPAIR  07/15/12   Dr [name]   ANTERIOR CERVICAL DECOMP/DISCECTOMY FUSION N/A 07/09/2020   Procedure: ANTERIOR CERVICAL DECOMPRESSION AND FUSION CERVICAL THREE-FOUR.;  Surgeon: [name], MD;  Location: [place];  Service: Neurosurgery;  Laterality: N/A;  anterior   BROW LIFT  05/07/01   COLONOSCOPY WITH PROPOFOL N/A 04/24/2022   Procedure: COLONOSCOPY WITH PROPOFOL;  Surgeon: Thornton Park, MD;  Location: [place];  Service: Gastroenterology;  Laterality: N/A;   COLONOSCOPY WITH PROPOFOL N/A 04/26/2022   Procedure: COLONOSCOPY WITH PROPOFOL;   Surgeon: Thornton Park, MD;  Location: [place];  Service: Gastroenterology;  Laterality: N/A;   CYSTOSCOPY WITH URETEROSCOPY AND STENT PLACEMENT Right 01/28/2014   Procedure: CYSTOSCOPY WITH URETEROSCOPY, BASKET RETRIVAL AND  STENT PLACEMENT;  Surgeon: [name], MD;  Location: [facility];  Service: Urology;  Laterality: Right;   epidural injections     multiple procedures   ESOPHAGEAL DILATION  1993 and 1994   multiple times   EYE SURGERY Bilateral 03/25/10, 2012   cataract removal   HEMORRHOID SURGERY  2014   HIATAL HERNIA REPAIR  12/21/93   HOLMIUM LASER APPLICATION Right [id number]   Procedure: HOLMIUM LASER APPLICATION;  Surgeon: [name], MD;  Location: [facility];  Service: Urology;  Laterality: Right;   INGUINAL HERNIA REPAIR Right 07/15/12   Dr [name], x2   JOINT REPLACEMENT  07/25/04   right knee   JOINT REPLACEMENT  07/13/10   left knee   KNEE SURGERY  1995   LAPAROSCOPIC CHOLECYSTECTOMY  09/20/06   with intraoperative cholangiogram and right inguinal herniorrhaphy with mesh    LIGAMENT REPAIR Left 07/2013   shoulder   LITHOTRIPSY     x2   NASAL SEPTUM SURGERY  1975   POLYPECTOMY  04/26/2022   Procedure: POLYPECTOMY;  Surgeon: Thornton Park, MD;  Location: [place];  Service: Gastroenterology;;   POSTERIOR CERVICAL FUSION/FORAMINOTOMY N/A 11/24/2020   Procedure: Posterior Cervical Laminectomy Cervical three-four, Cervical four-five with lateral mass fusion;  Surgeon: [name], MD;  Location: [place];  Service: Neurosurgery;  Laterality: N/A;   REFRACTIVE SURGERY Bilateral    Right total hip replacement  4/14   SKIN CANCER DESTRUCTION     nose, ear   TONSILLECTOMY  as child   TOTAL HIP ARTHROPLASTY Left    URETHRAL DILATION  1991   [name]   Patient Active Problem List   Diagnosis Date Noted   Weakness of lower extremity 10/10/2022   Skin tear of right elbow without complication [id number]   Skin tear of left lower leg without complication [id number]    Fall 09/18/2022   Iron deficiency anemia 09/18/2022   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   Vitamin D deficiency 08/07/2022   Closed fracture of left hip ([place]) 08/07/2022   Hand weakness 05/02/2022   Acute kidney injury ([place]) 05/02/2022   Adenomatous polyp of ascending colon    Adenomatous polyp of colon    Acute metabolic encephalopathy 0000000   GI bleed 04/21/2022   Vasovagal syncope    Hemorrhagic shock (HCC)    Hematochezia    Irregular heart beat 02/03/2022   Constipation 02/03/2022   Carpal tunnel syndrome 01/24/2022   Numbness of hand 01/24/2022   Ulnar neuropathy 01/24/2022   Skin lesion 01/16/2022   History of COVID-19 12/05/2021   Benign paroxysmal positional vertigo 09/14/2021   Palpitations 07/25/2021   Cardiac murmur 07/25/2021   History of chicken pox 07/22/2021   History of hemorrhoids 07/22/2021   Hypertension 07/22/2021   COPD (chronic obstructive pulmonary disease) ([place]) 07/22/2021   History of kidney stones 07/22/2021   History of skin cancer [id number]   Complication of anesthesia 07/22/2021   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[place]) 07/22/2021   Other abnormal glucose 07/22/2021   Spondylitis ([place]) 07/22/2021   Greater trochanteric pain syndrome 07/22/2021   Vertigo 07/08/2021   Hypokalemia 07/08/2021   Trochanteric bursitis of left hip 06/30/2021   Cancer ([place]) 04/11/2021   Lumbar radiculopathy 03/02/2021   Gait instability 03/02/2021   Hip pain 03/02/2021   Situational depression 03/02/2021   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[place]) 02/09/2021   Urinary retention 11/26/2020   Cervical myelopathy ([place]) 11/24/2020   Body mass index (BMI) 25.0-25.9, adult 11/02/2020   Lumbar spondylosis 11/02/2020   Lumbago of lumbar region with sciatica 10/21/2020   Cystoid macular edema of both eyes 07/28/2020   Status post cervical spinal fusion 07/09/2020   History of total bilateral knee replacement  07/08/2020   Aortic atherosclerosis ([place]) 03/05/2020   Emphysema, unspecified ([place]) 03/05/2020   Arthritis 03/04/2020   Diverticulosis 03/03/2020   Internal hemorrhoids 03/03/2020   Weight loss 03/03/2020   Heme positive stool 02/29/2020   Leg cramps 11/13/2019   Tibialis anterior tendon tear, nontraumatic 11/13/2019   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 07/29/2018   CPAP (continuous positive airway pressure) dependence 03/25/2018   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  Erectile dysfunction 06/20/2017   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 03/06/2017   Peripheral neuropathy 06/19/2016   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   Hearing loss 02/02/2016   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   Allergic rhinitis 02/02/2016   Atypical chest pain 11/24/2015   Preventative health care 11/24/2015   Onychomycosis 06/30/2015   Degenerative disc disease, lumbar 06/30/2015   Other allergic rhinitis 02/18/2015   Deviated nasal septum 02/18/2015   RLS (restless legs syndrome) 02/18/2015   GERD (gastroesophageal reflux disease) 09/18/2014   Hyperglycemia 03/03/2014   Rotator cuff tear 07/27/2013   Sleep apnea with use of continuous positive airway pressure (CPAP) 07/15/2013   Carotid stenosis 02/24/2013   Nonspecific abnormal electrocardiogram (ECG) (EKG) 01/06/2013   DJD (degenerative joint disease) of hip 12/24/2012   Lower GI bleed 12/12/2012   Right groin pain 11/29/2012   OSA (obstructive sleep apnea) 11/25/2012   Ventral hernia 07/08/2012   High risk medication use 01/18/2012   Dyspnea 06/12/2011   Hyperlipidemia with target LDL less than 130 12/27/2010   Osteoarthrosis, unspecified whether generalized or localized, pelvic region and thigh 07/25/2010   Osteoarthrosis, hand 06/13/2010   Anemia 12/17/2009   Hypothyroidism 05/13/2009   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   Benign essential hypertension 05/13/2009   Right inguinal hernia 05/13/2009    HIATAL HERNIA 05/13/2009   NEPHROLITHIASIS 05/13/2009   ROSACEA 05/13/2009    PCP: [name], NP   REFERRING PROVIDER: [name], NP   REFERRING DIAG: [phone] (ICD-10-CM) - S/P revision of total hip  PT eval and treat for aggressive left knee ROM, weight bearing as tolerated, balance/gait training. S/p revision of left hip replacement at [facility]. (Dr. Linton Rump). No precautions.  THERAPY DIAG:  Stiffness of left knee, not elsewhere classified  Muscle weakness (generalized)  Other abnormalities of gait and mobility  Unsteadiness on feet  Repeated falls  Other low back pain  RATIONALE FOR EVALUATION AND TREATMENT: Rehabilitation  ONSET DATE: 07/02/22  NEXT MD VISIT:  10/23/22   SUBJECTIVE:                                                                                                                                                                                                         SUBJECTIVE STATEMENT:  Pt reports they put him on antibiotic, which has been helping swelling in L lower leg. Pain is not bad today but 6 am this morning he had pain in low back and legs, none at the moment. PAIN: PAIN:  Are you having pain? Yes: NPRS scale: 0/10 Pain  location: low back and groin Pain description: ache Aggravating factors: movement Relieving factors: rest     PERTINENT HISTORY:  Revision of femoral component of L THA on 07/02/22 s/p fall with periprosthetic fracture; Aortic atherosclerosis; carotid stenosis; cervical myelopathy s/p ACDF 06/2020 and posterior cervical fusion 11/2020; peripheral neuropathy; lumbar DDD/spondylosis; lumbago with sciatica/lumbar radiculopathy; OA; B TKA; BTHA; L greater trochanteric pain syndrome; L RCR 2014; recent CTR surgery - early May; hearing loss; HTN; BPPV/vertigo; B sacroiliitis; skin cancer; COPD/emphysema; sleep apnea with CPAP; GERD; gout; HLD; LE cramps; RLS   PRECAUTIONS: Fall  WEIGHT BEARING RESTRICTIONS: No - FWB  on left as of 08/18/22  FALLS:  Has patient fallen in last 6 months? Yes. 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 The proximal femur fracture looks healed as I cannot see the fracture line in any view. Good leg lengths.  Right hip replacement is cementless and looks well-fixed with a little varus of the tibial stem.  No bony lesion or arthritic change or other abnormality except some lumbar disc degeneration   07/21/2022 - ORTHO XR PELVIS 2 OR 3 VW UNILATERAL HIP: FINDINGS: AP and lateral views of left hip were obtained and interpreted.  Status post new left total hip revision with wire fixation appearing in good position. Good limb length. No new fracture, bony lesion, or other abnormal findings.  07/21/2022 - ORTHO XR FEMUR 2 VW UNILATERAL: FINDINGS: 3 views of the left femur were obtained and interpreted. Status post new left total hip revision with wire fixation in good position. Previous left total knee arthroplasty. No new fracture, bony lesion, or other abnormal findings.   07/02/2022 - DG HIP (WITH OR WITHOUT PELVIS) 2-3V LEFT :   FINDINGS: Three fluoroscopic spot views of the hip obtained in the operating room. Cerclage wire fixation of periprosthetic fracture, in improved alignment. Fluoroscopy time 12 seconds. Dose 1.23 mGy. IMPRESSION: Fluoroscopic spot views of the hip during periprosthetic fracture fixation.   07/01/2022 - XR THIGH/FEMUR LEFT 2V & XR PELVIS LIMITED 1 OR 2 VIEWS AP: FINDINGS: Degenerative changes in the lower lumbar spine. Pelvis is intact. SI joints and symphysis pubis are not displaced. Right total hip arthroplasty with non cemented components. Tip of the arthroplasty is not included within the field of view. Visualized components appear well seated. Left total hip arthroplasty. The left hip demonstrates an acute periprosthetic fracture of the proximal femur with lateral displacement and mild distraction of the distal fracture fragment. No evidence of dislocation of the prosthesis. The midshaft and distal left femur are intact. Postoperative left knee arthroplasty. IMPRESSION: 1.  Left hip arthroplasty with periprosthetic fracture of the proximal femur. 2. Incidental note of right hip and left knee arthroplasties.   PATIENT SURVEYS:  LEFS 19 / 80 = 23.8 %  COGNITION: Overall cognitive status: Within functional limits for tasks assessed    SENSATION: Peripheral neuropathy in B feet    EDEMA:  Mild to moderate edema in distal B LE, L>R  MUSCLE LENGTH: (09/07/22) Hamstrings: mod tight R>L ITB: mod tight B Piriformis: mod/severe tight B Hip flexors:  Quads:  Heelcord:   POSTURE:  rounded shoulders, forward head, flexed trunk , and weight shift right  PALPATION: Increased muscle tension throughout left quads and IT band as well as hamstrings  LOWER EXTREMITY ROM:  Active ROM Right eval Left eval Left 2/1 and 2/6  Hip flexion     Hip extension     Hip abduction     Hip adduction     Hip internal rotation     Hip external rotation     Knee flexion 115 92 98  Knee extension 0 0    Ankle dorsiflexion  limited   Ankle plantarflexion     Ankle inversion     Ankle eversion      Passive ROM Left eval  Knee flexion 95  (Blank rows = not tested)  LOWER EXTREMITY MMT:  MMT Right Eval* Left Eval* Right 2/1 Left 2/1 Right 10/17/22 Left 10/17/22  Hip flexion 3+ 3+ 3+ 3+ 3+ 3+  Hip extension 4- 4- 4- 4-    Hip abduction 4 4- 4 4- 4 4  Hip adduction 4 4 4+ 4+ 4+ 4+  Hip internal rotation        Hip external rotation        Knee flexion 4 4 4 $ 4- 4- 4-  Knee extension 4+ 4+ 4+ 4+ 4 4  Ankle dorsiflexion 4+ 4      Ankle plantarflexion        Ankle inversion        Ankle eversion         (Blank rows = not tested, * - tested in sitting)  FUNCTIONAL TESTS:  5 times sit to stand: 14.22 sec (09/07/22) Timed up and go (TUG): 21.28 sec with SPC; 28.23 sec (10/03/22) 10 meter walk test: 19.94 sec with SPC; Gait speed = 1.63 ft/sec Berg Balance Scale: 38/56; 37-45 significant risk for falls (>80%)  (09/07/22) Dynamic Gait Index: 10/24; Scores of 19 or less are predicitve of falls in older community living adults (09/07/22)  PLOF (As of 06/20/22): ABC scale: 730 / 1600 = 45.6% Berg: 48/56; 46-51 moderate fall risk (>50%)  10MWT = 11.50 sec with SPC; 10.06 sec w/o AD Gait speed: 2.85 ft/sec with SPC; 3.26 ft/sec w/o AD DGI: 18/24; Scores of 19 or less are predictive of falls in older community living adults  GAIT: Distance walked: 60 ft Assistive device utilized: Single point cane Level of assistance: SBA Gait pattern: step to pattern, step through pattern, decreased step length- Right, decreased stance time- Left, decreased hip/knee flexion- Right, and decreased hip/knee flexion- Left Comments: Inconsistent step length on right creating a barrier to versus step through pattern.  Notable gait instability with SPC.   TODAY'S TREATMENT:  10/24/22 Therapeutic Exercise: to improve strength and mobility.  Demo, verbal and tactile cues throughout for technique.  Gait 1 lap  around clinic 55' with RW Nustep [id number] Seated march x 10 bil Seated LAQ x 10 bil Standing fwd and back WS x 10  R/L alternate Seated hip ABD leg straight out x 10 bil Seated clam GTB x 10 bil  10/17/22 Therapeutic Exercise: to improve strength and mobility.  Demo, verbal and tactile cues throughout for technique.  Nustep L5 x 7 min Therapeutic Activity: to improve functional performance. Assessed:  LE strength Knee ROM Gait with LRAD Stairs    [facility] Adult PT Treatment:                                                DATE: 10/12/22 Therapeutic Exercise: Nustep L5 x 6 min Mini squat 2 x 10 Seated LAQ red TB 2 x 10 Standing HS curl red TB 2 x 10 Standing hip abd red TB 2 x 10 Heel raises 2 x 10 Tandem stance 2 x 30 sec Sit to stand Rt foot on 2'' step focus on LT LE wt bearing 3 x 5    PATIENT EDUCATION:  Education details: HEP update  Person educated: Patient Education method: Explanation, Demonstration, Verbal cues, and Handouts Education comprehension: verbalized understanding, returned demonstration, and verbal cues required  HOME EXERCISE PROGRAM: Access Code: [email or web address] URL: [email or web address] Date: 10/03/2022 Prepared by: Almyra Free  Exercises - Supine Heel Slide with Strap  - 2 x daily - 7 x weekly - 2 sets - 10 reps - 3 sec hold - Hooklying Single Knee to Chest Stretch  - 2 x daily - 7 x weekly - 3 reps - 30 sec hold - Seated Hamstring Curls with Resistance  - 1 x daily - 7 x weekly - 2 sets - 10 reps - 3 sec hold - Seated Knee Extension with Resistance  - 1 x daily - 7 x weekly - 2 sets - 10 reps - Standing March with Counter Support  - 1 x daily - 7 x weekly - 2 sets - 10 reps - Seated Hamstring Stretch  - 2 x daily - 7 x weekly - 3 reps - 30 sec hold - Standing ITB Stretch  - 2 x daily - 7 x weekly - 3 reps - 30 sec hold - Standing Hip Flexor Stretch  - 2 x daily - 7 x weekly - 2 sets - 10 reps - 30 sec hold - Seated Hip Internal Rotation AROM  (Mirrored)  - 1 x daily - 7 x weekly - 3 sets - 10 reps  ASSESSMENT:  CLINICAL IMPRESSION:  Pt continues to have pain in L lower leg with WB but does report improvement since last week. Swelling has appeared to decrease and patient has been using antibiotics for this. Pt mostly limited today with exercises by general muscle fatigue. Cues with seated march and LAQ to avoid trunk extension. Cues during gait for heel strike. Instruction given with standing weight shifts to avoid L knee hyperextension to target correct muscles.    OBJECTIVE IMPAIRMENTS: Abnormal gait, decreased activity tolerance, decreased balance, decreased coordination, decreased endurance, decreased knowledge of condition, decreased knowledge of use of DME, decreased mobility, difficulty walking, decreased ROM, decreased strength, decreased safety awareness, increased fascial restrictions, impaired perceived functional ability, impaired flexibility, impaired sensation, improper body mechanics, postural dysfunction, and pain.   ACTIVITY LIMITATIONS: carrying, lifting, bending, sitting, standing, squatting, sleeping, stairs, transfers, bed mobility, bathing, dressing, locomotion level, and caring for others  PARTICIPATION LIMITATIONS: meal prep, cleaning, laundry, driving, shopping, community activity, and yard work  PERSONAL FACTORS: Age,  Fitness, Past/current experiences, Time since onset of injury/illness/exacerbation, and 3+ comorbidities: Aortic atherosclerosis; carotid stenosis; cervical myelopathy s/p ACDF 06/2020 and posterior cervical fusion 11/2020; peripheral neuropathy; lumbar DDD/spondylosis; lumbago with sciatica/lumbar radiculopathy; OA; B TKA; BTHA; L greater trochanteric pain syndrome; L RCR 2014; 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   REHAB  POTENTIAL: Good  CLINICAL DECISION MAKING: Evolving/moderate complexity  EVALUATION COMPLEXITY: Moderate   GOALS: Goals reviewed with patient? Yes  SHORT TERM GOALS: Target date: 10/03/2022   Patient will be independent with initial HEP. Baseline:  Goal status: MET  2.  Complete balance testing and establish additional LTGs as appropriate. Baseline:  Goal status: MET 09/07/22  3.  Patient will demonstrate decreased TUG time to </= 16 sec to decrease risk for falls with transitional mobility Baseline: 21.28 sec, 10/03/22 - 28.23 sec Goal status: IN PROGRESS  LONG TERM GOALS: Target date: 10/31/2022, extended to 12/01/22   Patient will be independent with advanced/ongoing HEP to improve outcomes and carryover.  Baseline:  Goal status: IN PROGRESS  2.  Patient will report at least 50-75% improvement in low back and Lhip pain to improve QOL. Baseline: 7/10 Goal status: IN PROGRESS  3.  Patient will demonstrate improved L knee AROM to [facility] to allow for normal gait and stair mechanics. Baseline: AROM 0-92, PROM 0-95 Goal status: IN PROGRESS  10/17/22- L knee AROM 0-98  4.  Patient will demonstrate improved B LE strength to >/= 4 to 4+/5 for improved stability and ease of mobility. Baseline:  Goal status: IN PROGRESS  10/17/22 - refer to above MMT table  5.  Patient will be able to ambulate 600' with LRAD and normal gait pattern, good stability and without increased pain to safely access community.  Baseline:  Goal status: IN PROGRESS  6. Patient will be able to ascend/descend stairs with 1 HR and reciprocal step pattern safely to access home and community.  Baseline:  Goal status: IN PROGRESS  10/17/22 - able to complete stairs with 1HR however resulting to step to pattern naturally  7.  Patient will report >/=28/80 on LEFS (patient reported outcome measure) to demonstrate improved functional ability. Baseline: 19 / 80 = 23.8 % Goal status: IN PROGRESS  8.  Patient will  demonstrate at least 19/24 on DGI to decrease risk of falls. Baseline: 10/24 (09/07/22) Goal status: IN PROGRESS   9.  Patient will improve Berg score by at least 8 points to improve safety and stability with ADLs in standing and reduce risk for falls Baseline: 38/56 (09/07/22) Goal status: IN PROGRESS   PLAN:  PT FREQUENCY: 2x/week  PT DURATION: 6 weeks  PLANNED INTERVENTIONS: Therapeutic exercises, Therapeutic activity, Neuromuscular re-education, Balance training, Gait training, Patient/Family education, Self Care, Joint mobilization, Stair training, DME instructions, Dry Needling, Electrical stimulation, Cryotherapy, Moist heat, scar mobilization, Taping, Ultrasound, Ionotophoresis 63m/ml Dexamethasone, Manual therapy, and Re-evaluation  PLAN FOR NEXT SESSION: review and update HEP, continue with LE flexibility and ROM exercises; pt permitting, work on balance and strengthening exercises.    BArtist Pais PTA 10/24/2022, 11:59 AM   FAlbertina Parr"[name] Pratt is progressing with with PT, but progress slowed by multiple comorbidities including low back pain an lumbar radiculopathy for which he was initially scheduled to have surgery in late Nov 2023 which had to be cancelled due his fall and resultant hip fracture.  He continues to experience pain from this as well as LE weakness, although strength has improved somewhat from the eval. He has also been limited recently by increased LE edema. Weakness and impaired balance continue to contribute to an increased fall risk indicating ongoing need for RW/4WW despite patient's desire to wean to the [facility]. STG #3 and LTGs yet to be met. Coleson "Josph Pratt" will benefit from continued skilled PT to address above deficits to improve mobility and activity tolerance with decreased pain interference, therefore recommend recert for additional 2x/wk for up to 6 weeks.    Percival Spanish, PT, MPT 10/24/22, 11:59 AM  [facility] [address] [place], [place], [address] Phone: [phone]   Fax:  [phone]

## 2022-10-26 ENCOUNTER — Ambulatory Visit: Payer: Medicare HMO | Admitting: Physical Therapy

## 2022-10-26 ENCOUNTER — Encounter: Payer: Self-pay | Admitting: Physical Therapy

## 2022-10-26 DIAGNOSIS — R296 Repeated falls: Secondary | ICD-10-CM | POA: Diagnosis not present

## 2022-10-26 DIAGNOSIS — M6281 Muscle weakness (generalized): Secondary | ICD-10-CM | POA: Diagnosis not present

## 2022-10-26 DIAGNOSIS — R2689 Other abnormalities of gait and mobility: Secondary | ICD-10-CM

## 2022-10-26 DIAGNOSIS — M25662 Stiffness of left knee, not elsewhere classified: Secondary | ICD-10-CM | POA: Diagnosis not present

## 2022-10-26 DIAGNOSIS — R2681 Unsteadiness on feet: Secondary | ICD-10-CM | POA: Diagnosis not present

## 2022-10-26 DIAGNOSIS — M5459 Other low back pain: Secondary | ICD-10-CM

## 2022-10-26 NOTE — Therapy (Signed)
OUTPATIENT PHYSICAL THERAPY TREATMENT       Patient Name: [name] Pratt MRN: [id number] DOB:04/23/1941, 82 y.o., male Today's Date: 10/26/2022   END OF SESSION:  PT End of Session - 10/26/22 1053     Visit Number 12    Number of Visits 22    Date for PT Re-Evaluation 12/01/22    Authorization Type [name]    Authorization Time Period through 12/01/22    Authorization - Visit Number 2    Authorization - Number of Visits 10    Progress Note Due on Visit 20   Recert/PN on visit #92 - 10/17/22   PT Start Time 1055    PT Stop Time 1150    PT Time Calculation (min) 55 min    Activity Tolerance Patient tolerated treatment well    Behavior During Therapy [facility] for tasks assessed/performed                    Past Medical History:  Diagnosis Date   Allergic rhinitis 02/02/2016   Anemia 12/17/2009   Formatting of this note might be different from the original. Anemia  10/1 IMO update   Aortic atherosclerosis ([place]) 03/05/2020   Arthritis    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   Atypical chest pain 11/24/2015   Benign essential hypertension 05/13/2009   Qualifier: Diagnosis of  By: Larwance Sachs of this note might be different from the original. Hypertension   Benign paroxysmal positional vertigo 09/14/2021   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[place]) 07/22/2021   Body mass index (BMI) 25.0-25.9, adult 11/02/2020   Cancer ([place])    skin cancer   Cardiac murmur 07/25/2021   Carotid stenosis    a. Carotid U/S 1/19: LICA < 41%, RICA 74-08%;  b.  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   Cystoid  macular edema of both eyes 07/28/2020   Degenerative disc disease, lumbar 06/30/2015   Deviated nasal septum 02/18/2015   Diverticulosis 03/03/2020   DJD (degenerative joint disease) of hip 12/24/2012   Dyspnea 06/12/2011   Emphysema, unspecified ([place]) 03/05/2020   Erectile dysfunction 06/20/2017   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[place]) 02/09/2021   Gait disorder 03/02/2021   GERD (gastroesophageal reflux disease)    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   Qualifier: Diagnosis of  By: Burnett Kanaris     Greater trochanteric pain syndrome 07/22/2021   Hand pain, left 01/16/2022   Hearing loss 02/02/2016   Heme positive stool 02/29/2020   HIATAL HERNIA 05/13/2009   Qualifier: Diagnosis of  By: Burnett Kanaris     High risk medication use 01/18/2012   Hip pain 03/02/2021   History of chicken pox    History of COVID-19 12/05/2021   History of hemorrhoids    History of kidney stones    History of skin cancer 07/22/2021   skin cancer   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 Cardiology - Dr Frances Nickels at [facility] cardiology  ICD-10 cut over   Hypertension    under control   Hypokalemia 07/08/2021   Hypothyroidism    Internal hemorrhoids 03/03/2020   Leg cramps 11/13/2019   Lower GI bleed 12/12/2012   Lumbago of lumbar region with sciatica 10/21/2020   Lumbar radiculopathy 03/02/2021   Lumbar spondylosis 11/02/2020   NEPHROLITHIASIS 05/13/2009   Qualifier: Diagnosis of  By: Burnett Kanaris     Nonspecific abnormal electrocardiogram (ECG) (EKG) 01/06/2013   Numbness of hand 01/24/2022   Onychomycosis 06/30/2015   Osteoarthrosis, 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 Osteoarthritis Of The Hand  10/1 IMO update   Osteoarthrosis,  unspecified whether generalized or localized, pelvic region and thigh 07/25/2010   Formatting of this note might be different from the original. Osteoarthritis Of The Hip   Other abnormal glucose 07/22/2021   Other allergic rhinitis 02/18/2015   Palpitations 07/25/2021   Peripheral neuropathy 06/19/2016   Preventative health care 11/24/2015   Right groin pain 11/29/2012   Right inguinal hernia 05/13/2009   Qualifier: Diagnosis of  By: Burnett Kanaris     RLS (restless legs syndrome) 02/18/2015   Rosacea    Rotator cuff tear 07/27/2013   Situational depression 03/02/2021   Skin lesion 01/16/2022   Sleep apnea with use of continuous positive airway pressure (CPAP) 07/15/2013   CPAP set to  7 cm water,  Residual AHi was 7.8  With no leak.  User time 6 hours.  479 days , not used only 12 days - highly compliant 06-23-13  .    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   Urinary retention 11/26/2020   Ventral hernia 07/08/2012   Vertigo 07/08/2021   Weight loss 03/03/2020   Past Surgical History:  Procedure Laterality Date   ABDOMINAL HERNIA REPAIR  07/15/12   Dr [name]   ANTERIOR CERVICAL DECOMP/DISCECTOMY FUSION N/A 07/09/2020   Procedure: ANTERIOR CERVICAL DECOMPRESSION AND FUSION CERVICAL THREE-FOUR.;  Surgeon: [name], MD;  Location: [place];  Service: Neurosurgery;  Laterality: N/A;  anterior   BROW LIFT  05/07/01   COLONOSCOPY WITH PROPOFOL N/A 04/24/2022   Procedure: COLONOSCOPY WITH PROPOFOL;  Surgeon: Thornton Park, MD;  Location: [place];  Service: Gastroenterology;  Laterality: N/A;   COLONOSCOPY WITH PROPOFOL N/A 04/26/2022   Procedure:  COLONOSCOPY WITH PROPOFOL;  Surgeon: Thornton Park, MD;  Location: [place];  Service: Gastroenterology;  Laterality: N/A;   CYSTOSCOPY WITH URETEROSCOPY AND STENT PLACEMENT Right 01/28/2014   Procedure: CYSTOSCOPY WITH URETEROSCOPY, BASKET RETRIVAL AND  STENT PLACEMENT;  Surgeon: [name], MD;  Location: [facility];  Service: Urology;  Laterality: Right;   epidural injections     multiple procedures   ESOPHAGEAL DILATION  1993 and 1994   multiple times   EYE SURGERY Bilateral 03/25/10, 2012   cataract removal   HEMORRHOID SURGERY  2014   HIATAL HERNIA REPAIR  12/21/93   HOLMIUM LASER APPLICATION Right 9/62/9528   Procedure: HOLMIUM LASER APPLICATION;  Surgeon: [name], MD;  Location: [facility];  Service: Urology;  Laterality: Right;   INGUINAL HERNIA REPAIR Right 07/15/12   Dr [name], x2   JOINT REPLACEMENT  07/25/04   right knee   JOINT REPLACEMENT  07/13/10   left knee   KNEE SURGERY  1995   LAPAROSCOPIC CHOLECYSTECTOMY  09/20/06   with intraoperative cholangiogram and right inguinal herniorrhaphy with mesh    LIGAMENT REPAIR Left 07/2013   shoulder   LITHOTRIPSY     x2   NASAL SEPTUM SURGERY  1975   POLYPECTOMY  04/26/2022   Procedure: POLYPECTOMY;  Surgeon: Thornton Park, MD;  Location: [place];  Service: Gastroenterology;;   POSTERIOR CERVICAL FUSION/FORAMINOTOMY N/A 11/24/2020   Procedure: Posterior Cervical Laminectomy Cervical three-four, Cervical four-five with lateral mass fusion;  Surgeon: [name], MD;  Location: [place];  Service: Neurosurgery;  Laterality: N/A;   REFRACTIVE SURGERY Bilateral    Right total hip replacement  4/14   SKIN CANCER DESTRUCTION     nose, ear   TONSILLECTOMY  as child   TOTAL HIP ARTHROPLASTY Left    URETHRAL DILATION  1991   [name]   Patient Active Problem List   Diagnosis Date Noted   Weakness of lower extremity 10/10/2022   Skin tear of right elbow without complication 45/62/5638   Skin tear of left lower leg  without complication 93/73/4287   Fall 09/18/2022   Iron deficiency anemia 09/18/2022   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   Vitamin D deficiency 08/07/2022   Closed fracture of left hip ([place]) 08/07/2022   Hand weakness 05/02/2022   Acute kidney injury ([place]) 05/02/2022   Adenomatous polyp of ascending colon    Adenomatous polyp of colon    Acute metabolic encephalopathy 68/07/5725   GI bleed 04/21/2022   Vasovagal syncope    Hemorrhagic shock (HCC)    Hematochezia    Irregular heart beat 02/03/2022   Constipation 02/03/2022   Carpal tunnel syndrome 01/24/2022   Numbness of hand 01/24/2022   Ulnar neuropathy 01/24/2022   Skin lesion 01/16/2022   History of COVID-19 12/05/2021   Benign paroxysmal positional vertigo 09/14/2021   Palpitations 07/25/2021   Cardiac murmur 07/25/2021   History of chicken pox 07/22/2021   History of hemorrhoids 07/22/2021   Hypertension 07/22/2021   COPD (chronic obstructive pulmonary disease) ([place]) 07/22/2021   History of kidney stones 07/22/2021   History of skin cancer 20/35/5974   Complication of anesthesia 07/22/2021   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[place]) 07/22/2021   Other abnormal glucose 07/22/2021   Spondylitis ([place]) 07/22/2021   Greater trochanteric pain syndrome 07/22/2021   Vertigo 07/08/2021   Hypokalemia 07/08/2021   Trochanteric bursitis of left hip 06/30/2021   Cancer ([place]) 04/11/2021   Lumbar radiculopathy 03/02/2021   Gait instability 03/02/2021   Hip pain 03/02/2021   Situational depression 03/02/2021   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[place]) 02/09/2021   Urinary retention 11/26/2020   Cervical myelopathy ([place]) 11/24/2020   Body mass index (BMI) 25.0-25.9, adult 11/02/2020   Lumbar spondylosis 11/02/2020   Lumbago of lumbar region with sciatica 10/21/2020   Cystoid macular edema of both eyes 07/28/2020   Status post cervical spinal fusion 07/09/2020   History of  total bilateral knee replacement 07/08/2020   Aortic atherosclerosis ([place]) 03/05/2020   Emphysema, unspecified ([place]) 03/05/2020   Arthritis 03/04/2020   Diverticulosis 03/03/2020   Internal hemorrhoids 03/03/2020   Weight loss 03/03/2020   Heme positive stool 02/29/2020   Leg cramps 11/13/2019   Tibialis anterior tendon tear, nontraumatic 11/13/2019   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 07/29/2018   CPAP (continuous positive airway  pressure) dependence 03/25/2018   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   Erectile dysfunction 06/20/2017   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 03/06/2017   Peripheral neuropathy 06/19/2016   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   Hearing loss 02/02/2016   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   Allergic rhinitis 02/02/2016   Atypical chest pain 11/24/2015   Preventative health care 11/24/2015   Onychomycosis 06/30/2015   Degenerative disc disease, lumbar 06/30/2015   Other allergic rhinitis 02/18/2015   Deviated nasal septum 02/18/2015   RLS (restless legs syndrome) 02/18/2015   GERD (gastroesophageal reflux disease) 09/18/2014   Hyperglycemia 03/03/2014   Rotator cuff tear 07/27/2013   Sleep apnea with use of continuous positive airway pressure (CPAP) 07/15/2013   Carotid stenosis 02/24/2013   Nonspecific abnormal electrocardiogram (ECG) (EKG) 01/06/2013   DJD (degenerative joint disease) of hip 12/24/2012   Lower GI bleed 12/12/2012   Right groin pain 11/29/2012   OSA (obstructive sleep apnea) 11/25/2012   Ventral hernia 07/08/2012   High risk medication use 01/18/2012   Dyspnea 06/12/2011   Hyperlipidemia with target LDL less than 130 12/27/2010   Osteoarthrosis, unspecified whether generalized or localized, pelvic region and thigh 07/25/2010   Osteoarthrosis, hand 06/13/2010   Anemia 12/17/2009   Hypothyroidism 05/13/2009   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   Benign essential hypertension 05/13/2009   Right  inguinal hernia 05/13/2009   HIATAL HERNIA 05/13/2009   NEPHROLITHIASIS 05/13/2009   ROSACEA 05/13/2009    PCP: [name], NP   REFERRING PROVIDER: [name], NP   REFERRING DIAG: [phone] (ICD-10-CM) - S/P revision of total hip  PT eval and treat for aggressive left knee ROM, weight bearing as tolerated, balance/gait training. S/p revision of left hip replacement at [facility]. (Dr. Linton Rump). No precautions.  THERAPY DIAG:  Stiffness of left knee, not elsewhere classified  Muscle weakness (generalized)  Unsteadiness on feet  Repeated falls  Other abnormalities of gait and mobility  Other low back pain  RATIONALE FOR EVALUATION AND TREATMENT: Rehabilitation  ONSET DATE: 07/02/22  NEXT MD VISIT:  pt will look and let us know when    SUBJECTIVE:                                                                                                                                                                                                         SUBJECTIVE STATEMENT:  Antibiotics ran out today but they have been helping, pt states that his back is not hurting today but that he feels weak from the knees down.   PAIN:  PAIN:  Are you having pain? Yes: NPRS scale: 0/10 Pain location: low back and groin Pain description: ache Aggravating factors: movement Relieving factors: rest     PERTINENT HISTORY:  Revision of femoral component of L THA on 07/02/22 s/p fall with periprosthetic fracture; Aortic atherosclerosis; carotid stenosis; cervical myelopathy s/p ACDF 06/2020 and posterior cervical fusion 11/2020; peripheral neuropathy; lumbar DDD/spondylosis; lumbago with sciatica/lumbar radiculopathy; OA; B TKA; BTHA; L greater trochanteric pain syndrome; L RCR 2014; recent CTR surgery - early May; hearing loss; HTN; BPPV/vertigo; B sacroiliitis; skin cancer; COPD/emphysema; sleep apnea with CPAP; GERD; gout; HLD; LE cramps; RLS   PRECAUTIONS: Fall  WEIGHT BEARING  RESTRICTIONS: No - FWB on left as of 08/18/22  FALLS:  Has patient fallen in last 6 months? Yes. 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 The proximal femur fracture looks healed as I cannot see the fracture line in any view. Good leg lengths.  Right hip replacement is cementless and looks well-fixed with a little varus of the tibial stem.  No bony lesion or arthritic change or other abnormality except some lumbar disc degeneration   07/21/2022 - ORTHO XR PELVIS 2 OR 3 VW UNILATERAL HIP: FINDINGS: AP and lateral views of left hip were obtained and interpreted.  Status post new left total hip revision with wire fixation appearing in good position. Good limb length. No new fracture, bony lesion, or other abnormal findings.  07/21/2022 - ORTHO XR FEMUR 2 VW UNILATERAL: FINDINGS: 3 views of the left femur were obtained and interpreted. Status post new left total hip revision with wire fixation in good position. Previous left total knee arthroplasty. No new fracture, bony lesion, or other abnormal findings.   07/02/2022 - DG HIP (WITH OR WITHOUT  PELVIS) 2-3V LEFT :  FINDINGS: Three fluoroscopic spot views of the hip obtained in the operating room. Cerclage wire fixation of periprosthetic fracture, in improved alignment. Fluoroscopy time 12 seconds. Dose 1.23 mGy. IMPRESSION: Fluoroscopic spot views of the hip during periprosthetic fracture fixation.   07/01/2022 - XR THIGH/FEMUR LEFT 2V & XR PELVIS LIMITED 1 OR 2 VIEWS AP: FINDINGS: Degenerative changes in the lower lumbar spine. Pelvis is intact. SI joints and symphysis pubis are not displaced. Right total hip arthroplasty with non cemented components. Tip of the arthroplasty is not included within the field of view. Visualized components appear well seated. Left total hip arthroplasty. The left hip demonstrates an acute periprosthetic fracture of the proximal femur with lateral displacement and mild distraction of the distal fracture fragment. No evidence of dislocation of the prosthesis. The midshaft and  distal left femur are intact. Postoperative left knee arthroplasty. IMPRESSION: 1. Left hip arthroplasty with periprosthetic fracture of the proximal femur. 2. Incidental note of right hip and left knee arthroplasties.   PATIENT SURVEYS:  LEFS 19 / 80 = 23.8 %  COGNITION: Overall cognitive status: Within functional limits for tasks assessed    SENSATION: Peripheral neuropathy in B feet    EDEMA:  Mild to moderate edema in distal B LE, L>R  MUSCLE LENGTH: (09/07/22) Hamstrings: mod tight R>L ITB: mod tight B Piriformis: mod/severe tight B Hip flexors:  Quads:  Heelcord:   POSTURE:  rounded shoulders, forward head, flexed trunk , and weight shift right  PALPATION: Increased muscle tension throughout left quads and IT band as well as hamstrings  LOWER EXTREMITY ROM:  Active ROM Right eval Left eval Left 2/1 and 2/6 Left 10/26/22  Hip flexion      Hip extension      Hip abduction      Hip adduction      Hip internal rotation      Hip external rotation      Knee  flexion 115 92 98 95  Knee extension 0 0    Ankle dorsiflexion  limited    Ankle plantarflexion      Ankle inversion      Ankle eversion       Passive ROM Left eval Left 10/26/22  Knee flexion 95 95  (Blank rows = not tested)  LOWER EXTREMITY MMT:  MMT Right Eval* Left Eval* Right 2/1 Left 2/1 Right 10/17/22 Left 10/17/22  Hip flexion 3+ 3+ 3+ 3+ 3+ 3+  Hip extension 4- 4- 4- 4-    Hip abduction 4 4- 4 4- 4 4  Hip adduction 4 4 4+ 4+ 4+ 4+  Hip internal rotation        Hip external rotation        Knee flexion '4 4 4 '$ 4- 4- 4-  Knee extension 4+ 4+ 4+ 4+ 4 4  Ankle dorsiflexion 4+ 4      Ankle plantarflexion        Ankle inversion        Ankle eversion         (Blank rows = not tested, * - tested in sitting)  FUNCTIONAL TESTS:  5 times sit to stand: 14.22 sec (09/07/22) Timed up and go (TUG): 21.28 sec with SPC; 28.23 sec (10/03/22) 10 meter walk test: 19.94 sec with SPC; Gait speed = 1.63 ft/sec Berg Balance Scale: 38/56; 37-45 significant risk for falls (>80%)  (09/07/22) Dynamic Gait Index: 10/24; Scores of 19 or less are predicitve of falls in older community living adults (09/07/22)  PLOF (As of 06/20/22): ABC scale: 730 / 1600 = 45.6% Berg: 48/56; 46-51 moderate fall risk (>50%)  10MWT = 11.50 sec with SPC; 10.06 sec w/o AD Gait speed: 2.85 ft/sec with SPC; 3.26 ft/sec w/o AD DGI: 18/24; Scores of 19 or less are predictive of falls in older community living adults  GAIT: Distance walked: 60 ft Assistive device utilized: Single point cane Level of assistance: SBA Gait pattern: step to pattern, step through pattern, decreased step length- Right, decreased stance time- Left, decreased hip/knee flexion- Right, and decreased hip/knee flexion- Left Comments: Inconsistent step length on right creating a barrier to versus step through pattern.  Notable gait instability with SPC.   TODAY'S TREATMENT:   10/26/22 THERAPEUTIC EXERCISE: to improve flexibility, strength and  mobility.  Verbal and tactile cues throughout for  technique. NuStep L5 x 6 min  Supine HS + Gastroc Stx with strap 2x5 10" hold  Mod thomas Quad Stx x5 10"hold  BAPS Board 4-way  L2 x10 , Circles CW/CC x5 each  Seated LAQ x 10 bil 3" hold  Standing fwd and back WS x 10 R/L alternate/ side to side   THERAPEUTIC ACTIVITIES: Re-assessment of Knee ROM    10/24/22 Therapeutic Exercise: to improve strength and mobility.  Demo, verbal and tactile cues throughout for technique.  Gait 1 lap around clinic 76' with RW Nustep [id number] Seated march x 10 bil Seated LAQ x 10 bil Standing fwd and back WS x 10 R/L alternate Seated hip ABD leg straight out x 10 bil Seated clam GTB x 10 bil   10/17/22 Therapeutic Exercise: to improve strength and mobility.  Demo, verbal and tactile cues throughout for technique.  Nustep L5 x 7 min Therapeutic Activity: to improve functional performance. Assessed:  LE strength Knee ROM Gait with LRAD Stairs    PATIENT EDUCATION:  Education details: HEP update  Person educated: Patient Education method: Explanation, Demonstration, Verbal cues, and Handouts Education comprehension: verbalized understanding, returned demonstration, and verbal cues required  HOME EXERCISE PROGRAM: Access Code: [email or web address]: [email or web address] Date: 10/03/2022 Prepared by: JAlmyra Free Exercises - Supine Heel Slide with Strap  - 2 x daily - 7 x weekly - 2 sets - 10 reps - 3 sec hold - Hooklying Single Knee to Chest Stretch  - 2 x daily - 7 x weekly - 3 reps - 30 sec hold - Seated Hamstring Curls with Resistance  - 1 x daily - 7 x weekly - 2 sets - 10 reps - 3 sec hold - Seated Knee Extension with Resistance  - 1 x daily - 7 x weekly - 2 sets - 10 reps - Standing March with Counter Support  - 1 x daily - 7 x weekly - 2 sets - 10 reps - Seated Hamstring Stretch  - 2 x daily - 7 x weekly - 3 reps - 30 sec hold - Standing ITB Stretch  - 2 x daily - 7 x weekly - 3 reps  - 30 sec hold - Standing Hip Flexor Stretch  - 2 x daily - 7 x weekly - 2 sets - 10 reps - 30 sec hold - Seated Hip Internal Rotation AROM (Mirrored)  - 1 x daily - 7 x weekly - 3 sets - 10 reps  ASSESSMENT:  CLINICAL IMPRESSION:  Pt came into today's session with no back pain, just generalized feeling of weakness from his knees down toward his foot. 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 Pt was advised on possibly reaching out to an orthopedist to see if he could get any orthotic recommendations - he reports he has some form of brace (?AFO) from a prior tendon injury in his L foot, therefore requested that he bring this with him to his next visit for PT to assess if it would be appropriate to assist with the current L foot drop. Today's session focused on ankle and heel cord flexibility to increase L ankle ROM for improved foot clearance to increase quality of gait and decrease risk of falls. Pt exhibited increased difficulty with L ankle dorsiflexion, inversion, and eversion therefore this should be a focus on the sessions to come. PT checked the sizing of pt's walker and  brought the handles down a unit to allow for more effective UE support and improved RW proximity to increase safety with gait as well. Pt continues to benefit from skilled PT interventions to address deficits seen above to increase quality of gait and decrease unsteadiness while standing.     OBJECTIVE IMPAIRMENTS: Abnormal gait, decreased activity tolerance, decreased balance, decreased coordination, decreased endurance, decreased knowledge of condition, decreased knowledge of use of DME, decreased mobility, difficulty walking, decreased ROM, decreased strength, decreased safety awareness, increased fascial restrictions, impaired perceived functional ability, impaired flexibility, impaired sensation, improper body  mechanics, postural dysfunction, and pain.   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   REHAB POTENTIAL: Good  CLINICAL DECISION MAKING: Evolving/moderate complexity  EVALUATION COMPLEXITY: Moderate   GOALS: Goals reviewed with patient? Yes  SHORT TERM GOALS: Target date: 10/03/2022   Patient will be independent with initial HEP. Baseline:  Goal status: MET  2.  Complete balance testing and establish additional LTGs as appropriate. Baseline:  Goal status: MET 09/07/22  3.  Patient will demonstrate decreased TUG time to </= 16 sec to decrease risk for falls with transitional mobility Baseline: 21.28 sec, 10/03/22 - 28.23 sec Goal status: IN PROGRESS  LONG TERM GOALS: Target date: 10/31/2022, extended to 12/01/22   Patient will be independent with advanced/ongoing HEP to improve outcomes and carryover.  Baseline:  Goal status: IN PROGRESS  2.  Patient will report at least 50-75% improvement in low back and Lhip pain to improve QOL. Baseline: 7/10 Goal status: IN PROGRESS  3.  Patient will demonstrate improved L knee AROM to  [facility] to allow for normal gait and stair mechanics. Baseline: AROM 0-92, PROM 0-95 Goal status: IN PROGRESS  10/17/22- L knee AROM 0-98  4.  Patient will demonstrate improved B LE strength to >/= 4 to 4+/5 for improved stability and ease of mobility. Baseline:  Goal status: IN PROGRESS  10/17/22 - refer to above MMT table  5.  Patient will be able to ambulate 600' with LRAD and normal gait pattern, good stability and without increased pain to safely access community.  Baseline:  Goal status: IN PROGRESS  6. Patient will be able to ascend/descend stairs with 1 HR and reciprocal step pattern safely to access home and community.  Baseline:  Goal status: IN PROGRESS  10/17/22 - able to complete stairs with 1HR however resulting to step to pattern naturally  7.  Patient will report >/=28/80 on LEFS (patient reported outcome measure) to demonstrate improved functional ability. Baseline: 19 / 80 = 23.8 % Goal status: IN PROGRESS  8.  Patient will demonstrate at least 19/24 on DGI to decrease risk of falls. Baseline: 10/24 (09/07/22) Goal status: IN PROGRESS   9.  Patient will improve Berg score by at least 8 points to improve safety and stability with ADLs in standing and reduce  risk for falls Baseline: 38/56 (09/07/22) Goal status: IN PROGRESS   PLAN:  PT FREQUENCY: 2x/week  PT DURATION: 6 weeks  PLANNED INTERVENTIONS: Therapeutic exercises, Therapeutic activity, Neuromuscular re-education, Balance training, Gait training, Patient/Family education, Self Care, Joint mobilization, Stair training, DME instructions, Dry Needling, Electrical stimulation, Cryotherapy, Moist heat, scar mobilization, Taping, Ultrasound, Ionotophoresis '4mg'$ /ml Dexamethasone, Manual therapy, and Re-evaluation  PLAN FOR NEXT SESSION: Assess L ankle brace/AFO for appropriateness to assist with L foot drop if pt brings brace to session; continue with LE flexibility and ROM exercises, work on balance and strengthening  exercises as able, update HEP accordingly.    Mikki Ziff Romero-Perozo, Student-PT 10/26/2022, 12:00 PM

## 2022-10-31 ENCOUNTER — Ambulatory Visit: Payer: Medicare HMO

## 2022-10-31 DIAGNOSIS — R296 Repeated falls: Secondary | ICD-10-CM | POA: Diagnosis not present

## 2022-10-31 DIAGNOSIS — M6281 Muscle weakness (generalized): Secondary | ICD-10-CM | POA: Diagnosis not present

## 2022-10-31 DIAGNOSIS — R2681 Unsteadiness on feet: Secondary | ICD-10-CM

## 2022-10-31 DIAGNOSIS — M25662 Stiffness of left knee, not elsewhere classified: Secondary | ICD-10-CM

## 2022-10-31 DIAGNOSIS — R2689 Other abnormalities of gait and mobility: Secondary | ICD-10-CM

## 2022-10-31 DIAGNOSIS — M5459 Other low back pain: Secondary | ICD-10-CM | POA: Diagnosis not present

## 2022-10-31 NOTE — Therapy (Signed)
OUTPATIENT PHYSICAL THERAPY TREATMENT       Patient Name: [name] Pratt MRN: [id number] DOB:1941/01/02, 82 y.o., male Today's Date: 10/31/2022   END OF SESSION:  PT End of Session - 10/31/22 1156     Visit Number 13    Number of Visits 22    Date for PT Re-Evaluation 12/01/22    Authorization Type [name]    Authorization Time Period through 12/01/22    Authorization - Visit Number 3    Authorization - Number of Visits 10    Progress Note Due on Visit 20   Recert/PN on visit [id number] - 10/17/22   PT Start Time 1104    PT Stop Time 1146    PT Time Calculation (min) 42 min    Activity Tolerance Patient tolerated treatment well    Behavior During Therapy [facility] for tasks assessed/performed                     Past Medical History:  Diagnosis Date   Allergic rhinitis 02/02/2016   Anemia 12/17/2009   Formatting of this note might be different from the original. Anemia  10/1 IMO update   Aortic atherosclerosis ([place]) 03/05/2020   Arthritis    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   Atypical chest pain 11/24/2015   Benign essential hypertension 05/13/2009   Qualifier: Diagnosis of  By: Larwance Sachs of this note might be different from the original. Hypertension   Benign paroxysmal positional vertigo 09/14/2021   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[place]) 07/22/2021   Body mass index (BMI) 25.0-25.9, adult 11/02/2020   Cancer (HCC)    skin cancer   Cardiac murmur 07/25/2021   Carotid stenosis    a. Carotid U/S [id number]: LICA < [id number], RICA 0000000;  b.  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    Cystoid macular edema of both eyes 07/28/2020   Degenerative disc disease, lumbar 06/30/2015   Deviated nasal septum 02/18/2015   Diverticulosis 03/03/2020   DJD (degenerative joint disease) of hip 12/24/2012   Dyspnea 06/12/2011   Emphysema, unspecified ([place]) 03/05/2020   Erectile dysfunction 06/20/2017   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[place]) 02/09/2021   Gait disorder 03/02/2021   GERD (gastroesophageal reflux disease)    GOUT 05/13/2009   Qualifier: Diagnosis of  By: Burnett Kanaris     Greater trochanteric pain syndrome 07/22/2021   Hand pain, left 01/16/2022   Hearing loss 02/02/2016   Heme positive stool 02/29/2020   HIATAL HERNIA 05/13/2009   Qualifier: Diagnosis of  By: Burnett Kanaris     High risk medication use 01/18/2012   Hip pain 03/02/2021   History of chicken pox    History of COVID-19 12/05/2021   History of hemorrhoids    History of kidney stones    History of skin cancer 07/22/2021   skin cancer   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 Cardiology - Dr Frances Nickels at [place]  cardiology  ICD-10 cut over   Hypertension    under control   Hypokalemia 07/08/2021   Hypothyroidism    Internal hemorrhoids 03/03/2020   Leg cramps 11/13/2019   Lower GI bleed 12/12/2012   Lumbago of lumbar region with sciatica 10/21/2020   Lumbar radiculopathy 03/02/2021   Lumbar spondylosis 11/02/2020   NEPHROLITHIASIS 05/13/2009   Qualifier: Diagnosis of  By: Burnett Kanaris     Nonspecific abnormal electrocardiogram (ECG) (EKG) 01/06/2013   Numbness of hand 01/24/2022   Onychomycosis 06/30/2015   Osteoarthrosis, 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 Osteoarthritis Of The Hand  10/1 IMO update    Osteoarthrosis, unspecified whether generalized or localized, pelvic region and thigh 07/25/2010   Formatting of this note might be different from the original. Osteoarthritis Of The Hip   Other abnormal glucose 07/22/2021   Other allergic rhinitis 02/18/2015   Palpitations 07/25/2021   Peripheral neuropathy 06/19/2016   Preventative health care 11/24/2015   Right groin pain 11/29/2012   Right inguinal hernia 05/13/2009   Qualifier: Diagnosis of  By: Burnett Kanaris     RLS (restless legs syndrome) 02/18/2015   Rosacea    Rotator cuff tear 07/27/2013   Situational depression 03/02/2021   Skin lesion 01/16/2022   Sleep apnea with use of continuous positive airway pressure (CPAP) 07/15/2013   CPAP set to  7 cm water,  Residual AHi was 7.8  With no leak.  User time 6 hours.  479 days , not used only 12 days - highly compliant 06-23-13  .    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   Urinary retention 11/26/2020   Ventral hernia 07/08/2012   Vertigo 07/08/2021   Weight loss 03/03/2020   Past Surgical History:  Procedure Laterality Date   ABDOMINAL HERNIA REPAIR  07/15/12   Dr [name]   ANTERIOR CERVICAL DECOMP/DISCECTOMY FUSION N/A 07/09/2020   Procedure: ANTERIOR CERVICAL DECOMPRESSION AND FUSION CERVICAL THREE-FOUR.;  Surgeon: [name], MD;  Location: [place];  Service: Neurosurgery;  Laterality: N/A;  anterior   BROW LIFT  05/07/01   COLONOSCOPY WITH PROPOFOL N/A 04/24/2022   Procedure: COLONOSCOPY WITH PROPOFOL;  Surgeon: Thornton Park, MD;  Location: [place];  Service: Gastroenterology;  Laterality: N/A;   COLONOSCOPY WITH PROPOFOL N/A 04/26/2022    Procedure: COLONOSCOPY WITH PROPOFOL;  Surgeon: Thornton Park, MD;  Location: [place];  Service: Gastroenterology;  Laterality: N/A;   CYSTOSCOPY WITH URETEROSCOPY AND STENT PLACEMENT Right 01/28/2014   Procedure: CYSTOSCOPY WITH URETEROSCOPY, BASKET RETRIVAL AND  STENT PLACEMENT;  Surgeon: [name], MD;  Location: [facility];  Service: Urology;  Laterality: Right;   epidural injections     multiple procedures   ESOPHAGEAL DILATION  1993 and 1994   multiple times   EYE SURGERY Bilateral 03/25/10, 2012   cataract removal   HEMORRHOID SURGERY  2014   HIATAL HERNIA REPAIR  12/21/93   HOLMIUM LASER APPLICATION Right [id number]   Procedure: HOLMIUM LASER APPLICATION;  Surgeon: [name], MD;  Location: [facility];  Service: Urology;  Laterality: Right;   INGUINAL HERNIA REPAIR Right 07/15/12   Dr [name], x2   JOINT  REPLACEMENT  07/25/04   right knee   JOINT REPLACEMENT  07/13/10   left knee   KNEE SURGERY  1995   LAPAROSCOPIC CHOLECYSTECTOMY  09/20/06   with intraoperative cholangiogram and right inguinal herniorrhaphy with mesh    LIGAMENT REPAIR Left 07/2013   shoulder   LITHOTRIPSY     x2   NASAL SEPTUM SURGERY  1975   POLYPECTOMY  04/26/2022   Procedure: POLYPECTOMY;  Surgeon: Thornton Park, MD;  Location: [place];  Service: Gastroenterology;;   POSTERIOR CERVICAL FUSION/FORAMINOTOMY N/A 11/24/2020   Procedure: Posterior Cervical Laminectomy Cervical three-four, Cervical four-five with lateral mass fusion;  Surgeon: [name], MD;  Location: [place];  Service: Neurosurgery;  Laterality: N/A;   REFRACTIVE SURGERY Bilateral    Right total hip replacement  4/14   SKIN CANCER DESTRUCTION     nose, ear   TONSILLECTOMY  as child   TOTAL HIP ARTHROPLASTY Left    URETHRAL DILATION  1991   [name]   Patient Active Problem List   Diagnosis Date Noted   Weakness of lower extremity 10/10/2022   Skin tear of right elbow without complication [id number]   Skin tear of left  lower leg without complication [id number]   Fall 09/18/2022   Iron deficiency anemia 09/18/2022   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   Vitamin D deficiency 08/07/2022   Closed fracture of left hip ([place]) 08/07/2022   Hand weakness 05/02/2022   Acute kidney injury ([place]) 05/02/2022   Adenomatous polyp of ascending colon    Adenomatous polyp of colon    Acute metabolic encephalopathy 0000000   GI bleed 04/21/2022   Vasovagal syncope    Hemorrhagic shock (HCC)    Hematochezia    Irregular heart beat 02/03/2022   Constipation 02/03/2022   Carpal tunnel syndrome 01/24/2022   Numbness of hand 01/24/2022   Ulnar neuropathy 01/24/2022   Skin lesion 01/16/2022   History of COVID-19 12/05/2021   Benign paroxysmal positional vertigo 09/14/2021   Palpitations 07/25/2021   Cardiac murmur 07/25/2021   History of chicken pox 07/22/2021   History of hemorrhoids 07/22/2021   Hypertension 07/22/2021   COPD (chronic obstructive pulmonary disease) ([place]) 07/22/2021   History of kidney stones 07/22/2021   History of skin cancer [id number]   Complication of anesthesia 07/22/2021   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[place]) 07/22/2021   Other abnormal glucose 07/22/2021   Spondylitis ([place]) 07/22/2021   Greater trochanteric pain syndrome 07/22/2021   Vertigo 07/08/2021   Hypokalemia 07/08/2021   Trochanteric bursitis of left hip 06/30/2021   Cancer ([place]) 04/11/2021   Lumbar radiculopathy 03/02/2021   Gait instability 03/02/2021   Hip pain 03/02/2021   Situational depression 03/02/2021   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[place]) 02/09/2021   Urinary retention 11/26/2020   Cervical myelopathy ([place]) 11/24/2020   Body mass index (BMI) 25.0-25.9, adult 11/02/2020   Lumbar spondylosis 11/02/2020   Lumbago of lumbar region with sciatica 10/21/2020   Cystoid macular edema of both eyes 07/28/2020   Status post cervical spinal fusion 07/09/2020    History of total bilateral knee replacement 07/08/2020   Aortic atherosclerosis ([place]) 03/05/2020   Emphysema, unspecified ([place]) 03/05/2020   Arthritis 03/04/2020   Diverticulosis 03/03/2020   Internal hemorrhoids 03/03/2020   Weight loss 03/03/2020   Heme positive stool 02/29/2020   Leg cramps 11/13/2019   Tibialis anterior tendon tear, nontraumatic 11/13/2019   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 07/29/2018   CPAP (continuous  positive airway pressure) dependence 03/25/2018   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   Erectile dysfunction 06/20/2017   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 03/06/2017   Peripheral neuropathy 06/19/2016   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   Hearing loss 02/02/2016   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   Allergic rhinitis 02/02/2016   Atypical chest pain 11/24/2015   Preventative health care 11/24/2015   Onychomycosis 06/30/2015   Degenerative disc disease, lumbar 06/30/2015   Other allergic rhinitis 02/18/2015   Deviated nasal septum 02/18/2015   RLS (restless legs syndrome) 02/18/2015   GERD (gastroesophageal reflux disease) 09/18/2014   Hyperglycemia 03/03/2014   Rotator cuff tear 07/27/2013   Sleep apnea with use of continuous positive airway pressure (CPAP) 07/15/2013   Carotid stenosis 02/24/2013   Nonspecific abnormal electrocardiogram (ECG) (EKG) 01/06/2013   DJD (degenerative joint disease) of hip 12/24/2012   Lower GI bleed 12/12/2012   Right groin pain 11/29/2012   OSA (obstructive sleep apnea) 11/25/2012   Ventral hernia 07/08/2012   High risk medication use 01/18/2012   Dyspnea 06/12/2011   Hyperlipidemia with target LDL less than 130 12/27/2010   Osteoarthrosis, unspecified whether generalized or localized, pelvic region and thigh 07/25/2010   Osteoarthrosis, hand 06/13/2010   Anemia 12/17/2009   Hypothyroidism 05/13/2009   GOUT 05/13/2009   Benign essential hypertension  05/13/2009   Right inguinal hernia 05/13/2009   HIATAL HERNIA 05/13/2009   NEPHROLITHIASIS 05/13/2009   ROSACEA 05/13/2009    PCP: [name], NP   REFERRING PROVIDER: [name], NP   REFERRING DIAG: [id number] (ICD-10-CM) - S/P revision of total hip  PT eval and treat for aggressive left knee ROM, weight bearing as tolerated, balance/gait training. S/p revision of left hip replacement at [facility]. (Dr. Linton Rump). No precautions.  THERAPY DIAG:  Stiffness of left knee, not elsewhere classified  Muscle weakness (generalized)  Unsteadiness on feet  Repeated falls  Other abnormalities of gait and mobility  Other low back pain  RATIONALE FOR EVALUATION AND TREATMENT: Rehabilitation  ONSET DATE: 07/02/22  NEXT MD VISIT:  pt will look and let us know when    SUBJECTIVE:                                                                                                                                                                                                         SUBJECTIVE STATEMENT:  Pt notes his knees aching, the lower leg is healing, just feels tired and weak and did not bring AFO this morning.  PAIN: PAIN:  Are you  having pain? Yes: NPRS scale: 0/10 Pain location: low back and groin Pain description: ache Aggravating factors: movement Relieving factors: rest     PERTINENT HISTORY:  Revision of femoral component of L THA on 07/02/22 s/p fall with periprosthetic fracture; Aortic atherosclerosis; carotid stenosis; cervical myelopathy s/p ACDF 06/2020 and posterior cervical fusion 11/2020; peripheral neuropathy; lumbar DDD/spondylosis; lumbago with sciatica/lumbar radiculopathy; OA; B TKA; BTHA; L greater trochanteric pain syndrome; L RCR 2014; recent CTR surgery - early May; hearing loss; HTN; BPPV/vertigo; B sacroiliitis; skin cancer; COPD/emphysema; sleep apnea with CPAP; GERD; gout; HLD; LE cramps; RLS   PRECAUTIONS: Fall  WEIGHT BEARING RESTRICTIONS:  No - FWB on left as of 08/18/22  FALLS:  Has patient fallen in last 6 months? Yes. 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 The proximal femur fracture looks healed as I cannot see the fracture line in any view. Good leg lengths.  Right hip replacement is cementless and looks well-fixed with a little varus of the tibial stem.  No bony lesion or arthritic change or other abnormality except some lumbar disc degeneration   07/21/2022 - ORTHO XR PELVIS 2 OR 3 VW UNILATERAL HIP: FINDINGS: AP and lateral views of left hip were obtained and interpreted.  Status post new left total hip revision with wire fixation appearing in good position. Good limb length. No new fracture, bony lesion, or other abnormal findings.  07/21/2022 - ORTHO XR FEMUR 2 VW UNILATERAL: FINDINGS: 3 views of the left femur were obtained and interpreted. Status post new left total hip revision with wire fixation in good position. Previous left total knee arthroplasty. No new fracture, bony lesion, or other abnormal findings.   07/02/2022 - DG HIP (WITH OR WITHOUT PELVIS) 2-3V  LEFT :  FINDINGS: Three fluoroscopic spot views of the hip obtained in the operating room. Cerclage wire fixation of periprosthetic fracture, in improved alignment. Fluoroscopy time 12 seconds. Dose 1.23 mGy. IMPRESSION: Fluoroscopic spot views of the hip during periprosthetic fracture fixation.   07/01/2022 - XR THIGH/FEMUR LEFT 2V & XR PELVIS LIMITED 1 OR 2 VIEWS AP: FINDINGS: Degenerative changes in the lower lumbar spine. Pelvis is intact. SI joints and symphysis pubis are not displaced. Right total hip arthroplasty with non cemented components. Tip of the arthroplasty is not included within the field of view. Visualized components appear well seated. Left total hip arthroplasty. The left hip demonstrates an acute periprosthetic fracture of the proximal femur with lateral displacement and mild distraction of the distal fracture fragment. No evidence of dislocation of the prosthesis. The midshaft and distal left femur are  intact. Postoperative left knee arthroplasty. IMPRESSION: 1. Left hip arthroplasty with periprosthetic fracture of the proximal femur. 2. Incidental note of right hip and left knee arthroplasties.   PATIENT SURVEYS:  LEFS 19 / 80 = 23.8 %  COGNITION: Overall cognitive status: Within functional limits for tasks assessed    SENSATION: Peripheral neuropathy in B feet    EDEMA:  Mild to moderate edema in distal B LE, L>R  MUSCLE LENGTH: (09/07/22) Hamstrings: mod tight R>L ITB: mod tight B Piriformis: mod/severe tight B Hip flexors:  Quads:  Heelcord:   POSTURE:  rounded shoulders, forward head, flexed trunk , and weight shift right  PALPATION: Increased muscle tension throughout left quads and IT band as well as hamstrings  LOWER EXTREMITY ROM:  Active ROM Right eval Left eval Left 2/1 and 2/6 Left 10/26/22  Hip flexion      Hip extension      Hip abduction      Hip adduction      Hip internal rotation      Hip external rotation      Knee flexion 115 92 98  95  Knee extension 0 0    Ankle dorsiflexion  limited    Ankle plantarflexion      Ankle inversion      Ankle eversion       Passive ROM Left eval Left 10/26/22  Knee flexion 95 95  (Blank rows = not tested)  LOWER EXTREMITY MMT:  MMT Right Eval* Left Eval* Right 2/1 Left 2/1 Right 10/17/22 Left 10/17/22  Hip flexion 3+ 3+ 3+ 3+ 3+ 3+  Hip extension 4- 4- 4- 4-    Hip abduction 4 4- 4 4- 4 4  Hip adduction 4 4 4+ 4+ 4+ 4+  Hip internal rotation        Hip external rotation        Knee flexion 4 4 4 $ 4- 4- 4-  Knee extension 4+ 4+ 4+ 4+ 4 4  Ankle dorsiflexion 4+ 4      Ankle plantarflexion        Ankle inversion        Ankle eversion         (Blank rows = not tested, * - tested in sitting)  FUNCTIONAL TESTS:  5 times sit to stand: 14.22 sec (09/07/22) Timed up and go (TUG): 21.28 sec with SPC; 28.23 sec (10/03/22) 10 meter walk test: 19.94 sec with SPC; Gait speed = 1.63 ft/sec Berg Balance Scale: 38/56; 37-45 significant risk for falls (>80%)  (09/07/22) Dynamic Gait Index: 10/24; Scores of 19 or less are predicitve of falls in older community living adults (09/07/22)  PLOF (As of 06/20/22): ABC scale: 730 / 1600 = 45.6% Berg: 48/56; 46-51 moderate fall risk (>50%)  10MWT = 11.50 sec with SPC; 10.06 sec w/o AD Gait speed: 2.85 ft/sec with SPC; 3.26 ft/sec w/o AD DGI: 18/24; Scores of 19 or less are predictive of falls in older community living adults  GAIT: Distance walked: 60 ft Assistive device utilized: Single point cane Level of assistance: SBA Gait pattern: step to pattern, step through pattern, decreased step length- Right, decreased stance time- Left, decreased hip/knee flexion- Right, and decreased hip/knee flexion- Left Comments: Inconsistent step length on right creating a barrier to versus step through pattern.  Notable gait instability with SPC.   TODAY'S TREATMENT:  10/31/22 THERAPEUTIC EXERCISE: to improve flexibility, strength and mobility.  Verbal  and tactile cues throughout for technique. NuStep L5 x 6  min  Seated march x 10 bil Seated LAQ x 10 bil Seated hip ABD RTB x 10 bil Seated row 15# x 10 Knee flexion 10# BLE x 10: 5# LLE x 10 Knee extension 15# BLE x 10; 5 LLE x 10 Gait 90 ft w/ rolling walker Standing march x 10 bil Standing hip abduction x 10 bil  10/26/22 THERAPEUTIC EXERCISE: to improve flexibility, strength and mobility.  Verbal and tactile cues throughout for technique. NuStep L5 x 6 min  Supine HS + Gastroc Stx with strap 2x5 10" hold  Mod thomas Quad Stx x5 10"hold  BAPS Board 4-way  L2 x10 , Circles CW/CC x5 each  Seated LAQ x 10 bil 3" hold  Standing fwd and back WS x 10 R/L alternate/ side to side   THERAPEUTIC ACTIVITIES: Re-assessment of Knee ROM    10/24/22 Therapeutic Exercise: to improve strength and mobility.  Demo, verbal and tactile cues throughout for technique.  Gait 1 lap around clinic 33' with RW Nustep [id number] Seated march x 10 bil Seated LAQ x 10 bil Standing fwd and back WS x 10 R/L alternate Seated hip ABD leg straight out x 10 bil Seated clam GTB x 10 bil   10/17/22 Therapeutic Exercise: to improve strength and mobility.  Demo, verbal and tactile cues throughout for technique.  Nustep L5 x 7 min Therapeutic Activity: to improve functional performance. Assessed:  LE strength Knee ROM Gait with LRAD Stairs    PATIENT EDUCATION:  Education details: HEP update  Person educated: Patient Education method: Explanation, Demonstration, Verbal cues, and Handouts Education comprehension: verbalized understanding, returned demonstration, and verbal cues required  HOME EXERCISE PROGRAM: Access Code: [email or web address]: [email or web address] Date: 10/03/2022 Prepared by: JAlmyra Free Exercises - Supine Heel Slide with Strap  - 2 x daily - 7 x weekly - 2 sets - 10 reps - 3 sec hold - Hooklying Single Knee to Chest Stretch  - 2 x daily - 7 x weekly - 3 reps - 30 sec hold - Seated  Hamstring Curls with Resistance  - 1 x daily - 7 x weekly - 2 sets - 10 reps - 3 sec hold - Seated Knee Extension with Resistance  - 1 x daily - 7 x weekly - 2 sets - 10 reps - Standing March with Counter Support  - 1 x daily - 7 x weekly - 2 sets - 10 reps - Seated Hamstring Stretch  - 2 x daily - 7 x weekly - 3 reps - 30 sec hold - Standing ITB Stretch  - 2 x daily - 7 x weekly - 3 reps - 30 sec hold - Standing Hip Flexor Stretch  - 2 x daily - 7 x weekly - 2 sets - 10 reps - 30 sec hold - Seated Hip Internal Rotation AROM (Mirrored)  - 1 x daily - 7 x weekly - 3 sets - 10 reps  ASSESSMENT:  CLINICAL IMPRESSION:  Pt continues to report general feelings of weakness and fatigue. Added weight machines to work on general strength with good response. After weight machines we did gait training and subjectively patient noted more ease with lifting LLE, however still compensating with hip flexors. Added more seated exercises to build strength in safe position, while also not being limited by fatigue.    OBJECTIVE IMPAIRMENTS: Abnormal gait, decreased activity tolerance, decreased balance, decreased coordination, decreased endurance, decreased knowledge of condition, decreased knowledge of use of DME, decreased mobility, difficulty walking, decreased ROM, decreased strength, decreased  safety awareness, increased fascial restrictions, impaired perceived functional ability, impaired flexibility, impaired sensation, improper body mechanics, postural dysfunction, and pain.   ACTIVITY LIMITATIONS: carrying, lifting, bending, sitting, standing, squatting, sleeping, stairs, transfers, bed mobility, bathing, dressing, locomotion level, and caring for others  PARTICIPATION LIMITATIONS: meal prep, cleaning, laundry, driving, shopping, community activity, and yard work  PERSONAL FACTORS: Age, Fitness, Past/current experiences, Time since onset of injury/illness/exacerbation, and 3+ comorbidities: Aortic  atherosclerosis; carotid stenosis; cervical myelopathy s/p ACDF 06/2020 and posterior cervical fusion 11/2020; peripheral neuropathy; lumbar DDD/spondylosis; lumbago with sciatica/lumbar radiculopathy; OA; B TKA; BTHA; L greater trochanteric pain syndrome; L RCR 2014; 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   REHAB POTENTIAL: Good  CLINICAL DECISION MAKING: Evolving/moderate complexity  EVALUATION COMPLEXITY: Moderate   GOALS: Goals reviewed with patient? Yes  SHORT TERM GOALS: Target date: 10/03/2022   Patient will be independent with initial HEP. Baseline:  Goal status: MET  2.  Complete balance testing and establish additional LTGs as appropriate. Baseline:  Goal status: MET 09/07/22  3.  Patient will demonstrate decreased TUG time to </= 16 sec to decrease risk for falls with transitional mobility Baseline: 21.28 sec, 10/03/22 - 28.23 sec Goal status: IN PROGRESS  LONG TERM GOALS: Target date: 10/31/2022, extended to 12/01/22   Patient will be independent with advanced/ongoing HEP to improve outcomes and carryover.  Baseline:  Goal status: IN PROGRESS  2.  Patient will report at least 50-75% improvement in low back and Lhip pain to improve QOL. Baseline: 7/10 Goal status: IN PROGRESS  3.  Patient will demonstrate improved L knee AROM to [facility] to allow for normal gait and stair mechanics. Baseline: AROM 0-92, PROM 0-95 Goal status: IN PROGRESS  10/17/22- L knee AROM 0-98  4.  Patient will demonstrate improved B LE strength to >/= 4 to 4+/5 for improved stability and ease of mobility. Baseline:  Goal status: IN PROGRESS  10/17/22 - refer to above MMT table  5.  Patient will be able to ambulate 600' with LRAD and normal gait pattern, good stability and without increased pain to safely access community.   Baseline:  Goal status: IN PROGRESS  6. Patient will be able to ascend/descend stairs with 1 HR and reciprocal step pattern safely to access home and community.  Baseline:  Goal status: IN PROGRESS  10/17/22 - able to complete stairs with 1HR however resulting to step to pattern naturally  7.  Patient will report >/=28/80 on LEFS (patient reported outcome measure) to demonstrate improved functional ability. Baseline: 19 / 80 = 23.8 % Goal status: IN PROGRESS  8.  Patient will demonstrate at least 19/24 on DGI to decrease risk of falls. Baseline: 10/24 (09/07/22) Goal status: IN PROGRESS   9.  Patient will improve Berg score by at least 8 points to improve safety and stability with ADLs in standing and reduce risk for falls Baseline: 38/56 (09/07/22) Goal status: IN PROGRESS   PLAN:  PT FREQUENCY: 2x/week  PT DURATION: 6 weeks  PLANNED INTERVENTIONS: Therapeutic exercises, Therapeutic activity, Neuromuscular re-education, Balance training, Gait training, Patient/Family education, Self Care, Joint mobilization, Stair training, DME instructions, Dry Needling, Electrical stimulation, Cryotherapy, Moist heat, scar mobilization, Taping, Ultrasound, Ionotophoresis 32m/ml Dexamethasone, Manual therapy, and Re-evaluation  PLAN FOR NEXT SESSION: Assess L ankle brace/AFO for appropriateness to assist with L foot drop if pt brings  brace to session; continue with LE flexibility and ROM exercises, work on balance and strengthening exercises as able, update HEP accordingly.    Artist Pais, PTA 10/31/2022, 11:56 AM

## 2022-11-02 ENCOUNTER — Ambulatory Visit: Payer: Medicare HMO | Admitting: Physical Therapy

## 2022-11-02 ENCOUNTER — Encounter: Payer: Self-pay | Admitting: Physical Therapy

## 2022-11-02 DIAGNOSIS — R2681 Unsteadiness on feet: Secondary | ICD-10-CM | POA: Diagnosis not present

## 2022-11-02 DIAGNOSIS — R296 Repeated falls: Secondary | ICD-10-CM | POA: Diagnosis not present

## 2022-11-02 DIAGNOSIS — M25662 Stiffness of left knee, not elsewhere classified: Secondary | ICD-10-CM | POA: Diagnosis not present

## 2022-11-02 DIAGNOSIS — M6281 Muscle weakness (generalized): Secondary | ICD-10-CM | POA: Diagnosis not present

## 2022-11-02 DIAGNOSIS — M5459 Other low back pain: Secondary | ICD-10-CM

## 2022-11-02 DIAGNOSIS — R2689 Other abnormalities of gait and mobility: Secondary | ICD-10-CM

## 2022-11-02 NOTE — Therapy (Signed)
OUTPATIENT PHYSICAL THERAPY TREATMENT       Patient Name: [name] Pratt MRN: [id number] DOB:[birth year], 82 y.o., male Today's Date: 11/02/2022   END OF SESSION:  PT End of Session - 11/02/22 0933     Visit Number 14    Number of Visits 22    Date for PT Re-Evaluation 12/01/22    Authorization Type [name]    Authorization Time Period through 12/01/22    Authorization - Visit Number 4    Authorization - Number of Visits 10    Progress Note Due on Visit 20    PT Start Time 0935    PT Stop Time 1030    PT Time Calculation (min) 55 min    Activity Tolerance Patient tolerated treatment well    Behavior During Therapy [facility] for tasks assessed/performed                      Past Medical History:  Diagnosis Date   Allergic rhinitis 02/02/2016   Anemia 12/17/2009   Formatting of this note might be different from the original. Anemia  10/1 IMO update   Aortic atherosclerosis ([place]) 03/05/2020   Arthritis    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   Atypical chest pain 11/24/2015   Benign essential hypertension 05/13/2009   Qualifier: Diagnosis of  By: Larwance Sachs of this note might be different from the original. Hypertension   Benign paroxysmal positional vertigo 09/14/2021   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[place]) 07/22/2021   Body mass index (BMI) 25.0-25.9, adult 11/02/2020   Cancer ([place])    skin cancer   Cardiac murmur 07/25/2021   Carotid stenosis    a. Carotid U/S [id number]: LICA < [id number], RICA 0000000;  b.  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   Cystoid macular edema of both eyes  07/28/2020   Degenerative disc disease, lumbar 06/30/2015   Deviated nasal septum 02/18/2015   Diverticulosis 03/03/2020   DJD (degenerative joint disease) of hip 12/24/2012   Dyspnea 06/12/2011   Emphysema, unspecified ([place]) 03/05/2020   Erectile dysfunction 06/20/2017   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[place]) 02/09/2021   Gait disorder 03/02/2021   GERD (gastroesophageal reflux disease)    GOUT 05/13/2009   Qualifier: Diagnosis of  By: Burnett Kanaris     Greater trochanteric pain syndrome 07/22/2021   Hand pain, left 01/16/2022   Hearing loss 02/02/2016   Heme positive stool 02/29/2020   HIATAL HERNIA 05/13/2009   Qualifier: Diagnosis of  By: Burnett Kanaris     High risk medication use 01/18/2012   Hip pain 03/02/2021   History of chicken pox    History of COVID-19 12/05/2021   History of hemorrhoids    History of kidney stones    History of skin cancer 07/22/2021   skin cancer   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 Cardiology - Dr Frances Nickels at [facility] cardiology  ICD-10 cut over  Hypertension    under control   Hypokalemia 07/08/2021   Hypothyroidism    Internal hemorrhoids 03/03/2020   Leg cramps 11/13/2019   Lower GI bleed 12/12/2012   Lumbago of lumbar region with sciatica 10/21/2020   Lumbar radiculopathy 03/02/2021   Lumbar spondylosis 11/02/2020   NEPHROLITHIASIS 05/13/2009   Qualifier: Diagnosis of  By: Burnett Kanaris     Nonspecific abnormal electrocardiogram (ECG) (EKG) 01/06/2013   Numbness of hand 01/24/2022   Onychomycosis 06/30/2015   Osteoarthrosis, 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 Osteoarthritis Of The Hand  10/1 IMO update   Osteoarthrosis, unspecified whether generalized  or localized, pelvic region and thigh 07/25/2010   Formatting of this note might be different from the original. Osteoarthritis Of The Hip   Other abnormal glucose 07/22/2021   Other allergic rhinitis 02/18/2015   Palpitations 07/25/2021   Peripheral neuropathy 06/19/2016   Preventative health care 11/24/2015   Right groin pain 11/29/2012   Right inguinal hernia 05/13/2009   Qualifier: Diagnosis of  By: Burnett Kanaris     RLS (restless legs syndrome) 02/18/2015   Rosacea    Rotator cuff tear 07/27/2013   Situational depression 03/02/2021   Skin lesion 01/16/2022   Sleep apnea with use of continuous positive airway pressure (CPAP) 07/15/2013   CPAP set to  7 cm water,  Residual AHi was 7.8  With no leak.  User time 6 hours.  479 days , not used only 12 days - highly compliant 06-23-13  .    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   Urinary retention 11/26/2020   Ventral hernia 07/08/2012   Vertigo 07/08/2021   Weight loss 03/03/2020   Past Surgical History:  Procedure Laterality Date   ABDOMINAL HERNIA REPAIR  07/15/12   Dr [name]   ANTERIOR CERVICAL DECOMP/DISCECTOMY FUSION N/A 07/09/2020   Procedure: ANTERIOR CERVICAL DECOMPRESSION AND FUSION CERVICAL THREE-FOUR.;  Surgeon: [name], MD;  Location: [place];  Service: Neurosurgery;  Laterality: N/A;  anterior   BROW LIFT  05/07/01   COLONOSCOPY WITH PROPOFOL N/A 04/24/2022   Procedure: COLONOSCOPY WITH PROPOFOL;  Surgeon: Thornton Park, MD;  Location: [place];  Service: Gastroenterology;  Laterality: N/A;   COLONOSCOPY WITH PROPOFOL N/A 04/26/2022   Procedure: COLONOSCOPY WITH PROPOFOL;   Surgeon: Thornton Park, MD;  Location: [place];  Service: Gastroenterology;  Laterality: N/A;   CYSTOSCOPY WITH URETEROSCOPY AND STENT PLACEMENT Right 01/28/2014   Procedure: CYSTOSCOPY WITH URETEROSCOPY, BASKET RETRIVAL AND  STENT PLACEMENT;  Surgeon: [name], MD;  Location: [facility];  Service: Urology;  Laterality: Right;   epidural injections     multiple procedures   ESOPHAGEAL DILATION  1993 and 1994   multiple times   EYE SURGERY Bilateral 03/25/10, 2012   cataract removal   HEMORRHOID SURGERY  2014   HIATAL HERNIA REPAIR  12/21/93   HOLMIUM LASER APPLICATION Right [id number]   Procedure: HOLMIUM LASER APPLICATION;  Surgeon: [name], MD;  Location: [facility];  Service: Urology;  Laterality: Right;   INGUINAL HERNIA REPAIR Right 07/15/12   Dr [name], x2   JOINT REPLACEMENT  07/25/04   right knee  JOINT REPLACEMENT  07/13/10   left knee   KNEE SURGERY  1995   LAPAROSCOPIC CHOLECYSTECTOMY  09/20/06   with intraoperative cholangiogram and right inguinal herniorrhaphy with mesh    LIGAMENT REPAIR Left 07/2013   shoulder   LITHOTRIPSY     x2   NASAL SEPTUM SURGERY  1975   POLYPECTOMY  04/26/2022   Procedure: POLYPECTOMY;  Surgeon: Thornton Park, MD;  Location: [place];  Service: Gastroenterology;;   POSTERIOR CERVICAL FUSION/FORAMINOTOMY N/A 11/24/2020   Procedure: Posterior Cervical Laminectomy Cervical three-four, Cervical four-five with lateral mass fusion;  Surgeon: [name], MD;  Location: [place];  Service: Neurosurgery;  Laterality: N/A;   REFRACTIVE SURGERY Bilateral    Right total hip replacement  4/14   SKIN CANCER DESTRUCTION     nose, ear   TONSILLECTOMY  as child   TOTAL HIP ARTHROPLASTY Left    URETHRAL DILATION  1991   [name]   Patient Active Problem List   Diagnosis Date Noted   Weakness of lower extremity 10/10/2022   Skin tear of right elbow without complication [id number]   Skin tear of left lower leg without complication [id number]    Fall 09/18/2022   Iron deficiency anemia 09/18/2022   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   Vitamin D deficiency 08/07/2022   Closed fracture of left hip ([place]) 08/07/2022   Hand weakness 05/02/2022   Acute kidney injury ([place]) 05/02/2022   Adenomatous polyp of ascending colon    Adenomatous polyp of colon    Acute metabolic encephalopathy 0000000   GI bleed 04/21/2022   Vasovagal syncope    Hemorrhagic shock (HCC)    Hematochezia    Irregular heart beat 02/03/2022   Constipation 02/03/2022   Carpal tunnel syndrome 01/24/2022   Numbness of hand 01/24/2022   Ulnar neuropathy 01/24/2022   Skin lesion 01/16/2022   History of COVID-19 12/05/2021   Benign paroxysmal positional vertigo 09/14/2021   Palpitations 07/25/2021   Cardiac murmur 07/25/2021   History of chicken pox 07/22/2021   History of hemorrhoids 07/22/2021   Hypertension 07/22/2021   COPD (chronic obstructive pulmonary disease) ([place]) 07/22/2021   History of kidney stones 07/22/2021   History of skin cancer [id number]   Complication of anesthesia 07/22/2021   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[place]) 07/22/2021   Other abnormal glucose 07/22/2021   Spondylitis ([place]) 07/22/2021   Greater trochanteric pain syndrome 07/22/2021   Vertigo 07/08/2021   Hypokalemia 07/08/2021   Trochanteric bursitis of left hip 06/30/2021   Cancer ([place] ) 04/11/2021   Lumbar radiculopathy 03/02/2021   Gait instability 03/02/2021   Hip pain 03/02/2021   Situational depression 03/02/2021   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[place]) 02/09/2021   Urinary retention 11/26/2020   Cervical myelopathy ([place]) 11/24/2020   Body mass index (BMI) 25.0-25.9, adult 11/02/2020   Lumbar spondylosis 11/02/2020   Lumbago of lumbar region with sciatica 10/21/2020   Cystoid macular edema of both eyes 07/28/2020   Status post cervical spinal fusion 07/09/2020   History of total bilateral knee replacement  07/08/2020   Aortic atherosclerosis ([place]) 03/05/2020   Emphysema, unspecified ([place]) 03/05/2020   Arthritis 03/04/2020   Diverticulosis 03/03/2020   Internal hemorrhoids 03/03/2020   Weight loss 03/03/2020   Heme positive stool 02/29/2020   Leg cramps 11/13/2019   Tibialis anterior tendon tear, nontraumatic 11/13/2019   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 07/29/2018   CPAP (continuous positive airway pressure) dependence 03/25/2018   Complex sleep  apnea syndrome 03/25/2018   Erectile dysfunction 06/20/2017   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 03/06/2017   Peripheral neuropathy 06/19/2016   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   Hearing loss 02/02/2016   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   Allergic rhinitis 02/02/2016   Atypical chest pain 11/24/2015   Preventative health care 11/24/2015   Onychomycosis 06/30/2015   Degenerative disc disease, lumbar 06/30/2015   Other allergic rhinitis 02/18/2015   Deviated nasal septum 02/18/2015   RLS (restless legs syndrome) 02/18/2015   GERD (gastroesophageal reflux disease) 09/18/2014   Hyperglycemia 03/03/2014   Rotator cuff tear 07/27/2013   Sleep apnea with use of continuous positive airway pressure (CPAP) 07/15/2013   Carotid stenosis 02/24/2013   Nonspecific abnormal electrocardiogram (ECG) (EKG) 01/06/2013   DJD (degenerative joint disease) of hip 12/24/2012   Lower GI bleed 12/12/2012   Right groin pain 11/29/2012   OSA (obstructive sleep apnea) 11/25/2012   Ventral hernia 07/08/2012   High risk medication use 01/18/2012   Dyspnea 06/12/2011   Hyperlipidemia with target LDL less than 130 12/27/2010   Osteoarthrosis, unspecified whether generalized or localized, pelvic region and thigh 07/25/2010   Osteoarthrosis, hand 06/13/2010   Anemia 12/17/2009   Hypothyroidism 05/13/2009   GOUT 05/13/2009   Benign essential hypertension 05/13/2009   Right inguinal hernia 05/13/2009    HIATAL HERNIA 05/13/2009   NEPHROLITHIASIS 05/13/2009   ROSACEA 05/13/2009    PCP: [name], NP   REFERRING PROVIDER: [name], NP   REFERRING DIAG: [phone] (ICD-10-CM) - S/P revision of total hip  PT eval and treat for aggressive left knee ROM, weight bearing as tolerated, balance/gait training. S/p revision of left hip replacement at [facility]. (Dr. Linton Rump). No precautions.  THERAPY DIAG:  Stiffness of left knee, not elsewhere classified  Muscle weakness (generalized)  Unsteadiness on feet  Other abnormalities of gait and mobility  Repeated falls  Other low back pain  RATIONALE FOR EVALUATION AND TREATMENT: Rehabilitation  ONSET DATE: 07/02/22  NEXT MD VISIT:  pt will look and let us know when    SUBJECTIVE:                                                                                                                                                                                                         SUBJECTIVE STATEMENT:  Pt has been problems with bowel movements and thinks he might have a hemorrhoids but has not been able to be seen by an MD yet. Back has been tightening up but seems to be better at  the moment. Legs still feel tired and weak. Also reports cramps from his L foot into his calf.   PAIN: PAIN:  Are you having pain? Yes: NPRS scale: 0/10 Pain location: low back and groin Pain description: ache Aggravating factors: movement Relieving factors: rest     PERTINENT HISTORY:  Revision of femoral component of L THA on 07/02/22 s/p fall with periprosthetic fracture; Aortic atherosclerosis; carotid stenosis; cervical myelopathy s/p ACDF 06/2020 and posterior cervical fusion 11/2020; peripheral neuropathy; lumbar DDD/spondylosis; lumbago with sciatica/lumbar radiculopathy; OA; B TKA; BTHA; L greater trochanteric pain syndrome; L RCR 2014; recent CTR surgery - early May; hearing loss; HTN; BPPV/vertigo; B sacroiliitis; skin cancer;  COPD/emphysema; sleep apnea with CPAP; GERD; gout; HLD; LE cramps; RLS   PRECAUTIONS: Fall  WEIGHT BEARING RESTRICTIONS: No - FWB on left as of 08/18/22  FALLS:  Has patient fallen in last 6 months? Yes. 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 The proximal femur fracture looks healed as I cannot see the fracture line in any view. Good leg lengths.  Right hip replacement is cementless and looks well-fixed with a little varus of the tibial stem.  No bony lesion or arthritic change or other abnormality except some lumbar disc degeneration   07/21/2022 - ORTHO XR PELVIS 2 OR 3 VW UNILATERAL HIP: FINDINGS: AP and lateral views of left hip were obtained and interpreted.  Status post new left total hip revision with wire fixation appearing in good position. Good limb length. No new fracture, bony lesion, or other abnormal findings.  07/21/2022 - ORTHO XR FEMUR 2 VW UNILATERAL: FINDINGS: 3 views of the left femur were obtained and interpreted. Status post new left total hip revision with wire fixation in good position. Previous left total knee  arthroplasty. No new fracture, bony lesion, or other abnormal findings.   07/02/2022 - DG HIP (WITH OR WITHOUT PELVIS) 2-3V LEFT :  FINDINGS: Three fluoroscopic spot views of the hip obtained in the operating room. Cerclage wire fixation of periprosthetic fracture, in improved alignment. Fluoroscopy time 12 seconds. Dose 1.23 mGy. IMPRESSION: Fluoroscopic spot views of the hip during periprosthetic fracture fixation.   07/01/2022 - XR THIGH/FEMUR LEFT 2V & XR PELVIS LIMITED 1 OR 2 VIEWS AP: FINDINGS: Degenerative changes in the lower lumbar spine. Pelvis is intact. SI joints and symphysis pubis are not displaced. Right total hip arthroplasty with non cemented components. Tip of the arthroplasty is not included within the field of view. Visualized components appear well seated. Left total hip arthroplasty. The left hip demonstrates an acute periprosthetic fracture of the proximal femur  with lateral displacement and mild distraction of the distal fracture fragment. No evidence of dislocation of the prosthesis. The midshaft and distal left femur are intact. Postoperative left knee arthroplasty. IMPRESSION: 1. Left hip arthroplasty with periprosthetic fracture of the proximal femur. 2. Incidental note of right hip and left knee arthroplasties.   PATIENT SURVEYS:  LEFS 19 / 80 = 23.8 %  COGNITION: Overall cognitive status: Within functional limits for tasks assessed    SENSATION: Peripheral neuropathy in B feet    EDEMA:  Mild to moderate edema in distal B LE, L>R  MUSCLE LENGTH: (09/07/22) Hamstrings: mod tight R>L ITB: mod tight B Piriformis: mod/severe tight B Hip flexors:  Quads:  Heelcord:   POSTURE:  rounded shoulders, forward head, flexed trunk , and weight shift right  PALPATION: Increased muscle tension throughout left quads and IT band as well as hamstrings  LOWER EXTREMITY ROM:  Active ROM Right eval Left eval Left 2/1 and 2/6 Left 10/26/22  Hip flexion      Hip  extension      Hip abduction      Hip adduction      Hip internal rotation      Hip external rotation      Knee flexion 115 92 98 95  Knee extension 0 0    Ankle dorsiflexion  limited    Ankle plantarflexion      Ankle inversion      Ankle eversion       Passive ROM Left eval Left 10/26/22  Knee flexion 95 95  (Blank rows = not tested)  LOWER EXTREMITY MMT:  MMT Right Eval* Left Eval* Right 2/1 Left 2/1 Right 10/17/22 Left 10/17/22  Hip flexion 3+ 3+ 3+ 3+ 3+ 3+  Hip extension 4- 4- 4- 4-    Hip abduction 4 4- 4 4- 4 4  Hip adduction 4 4 4+ 4+ 4+ 4+  Hip internal rotation        Hip external rotation        Knee flexion 4 4 4 $ 4- 4- 4-  Knee extension 4+ 4+ 4+ 4+ 4 4  Ankle dorsiflexion 4+ 4      Ankle plantarflexion        Ankle inversion        Ankle eversion         (Blank rows = not tested, * - tested in sitting)  FUNCTIONAL TESTS:  5 times sit to stand: 14.22 sec (09/07/22) Timed up and go (TUG): 21.28 sec with SPC; 28.23 sec (10/03/22) 10 meter walk test: 19.94 sec with SPC; Gait speed = 1.63 ft/sec Berg Balance Scale: 38/56; 37-45 significant risk for falls (>80%)  (09/07/22) Dynamic Gait Index: 10/24; Scores of 19 or less are predicitve of falls in older community living adults (09/07/22)  PLOF (As of 06/20/22): ABC scale: 730 / 1600 = 45.6% Berg: 48/56; 46-51 moderate fall risk (>50%)  10MWT = 11.50 sec with SPC; 10.06 sec w/o AD Gait speed: 2.85 ft/sec with SPC; 3.26 ft/sec w/o AD DGI: 18/24; Scores of 19 or less are predictive of falls in older community living adults  GAIT: Distance walked: 60 ft Assistive device utilized: Single point cane Level of assistance: SBA Gait pattern: step to pattern, step through pattern, decreased step length- Right, decreased stance time- Left, decreased hip/knee flexion- Right, and decreased hip/knee flexion- Left Comments: Inconsistent step length on right creating a barrier to versus step through pattern.  Notable gait  instability with SPC.  TODAY'S TREATMENT:   11/02/22 THERAPEUTIC EXERCISE: to improve flexibility, strength and mobility.  Verbal and tactile cues throughout for technique. Standing Marching x10 each Seated LAQ x10 each  Standing HS Curls x10 each Seated hip ABD RTB x 10 bil  Step Ups 6" x 5 each - increased difficulty stepping up with L foot, SBA/CGA via gait belt with min A for correction of LOB x 1    GAIT TRAINING: To normalize gait pattern, improve safety with RW, and assess gait with L posterior lateral strut AFO . Distance walked: 138f Assistive device utilized: WEnvironmental consultant- 2 wheeled Level of assistance: Modified independence Gait pattern: step to pattern, step through pattern, and decreased stride length Comments: increased step length and better toe clearance with L foot usage of AFO    THERAPEUTIC ACTIVITIES:  AFO education - donning/doffing and regular monitoring for friction/skin breakdown   10/31/22 THERAPEUTIC EXERCISE: to improve flexibility, strength and mobility.  Verbal and tactile cues throughout for technique. NuStep L5 x 6 min  Seated march x 10 bil Seated LAQ x 10 bil Seated hip ABD RTB x 10 bil Seated row 15# x 10 Knee flexion 10# BLE x 10: 5# LLE x 10 Knee extension 15# BLE x 10; 5 LLE x 10 Gait 90 ft w/ rolling walker Standing march x 10 bil Standing hip abduction x 10 bil   10/26/22 THERAPEUTIC EXERCISE: to improve flexibility, strength and mobility.  Verbal and tactile cues throughout for technique. NuStep L5 x 6 min  Supine HS + Gastroc Stx with strap 2x5 10" hold  Mod thomas Quad Stx x5 10"hold  BAPS Board 4-way  L2 x10 , Circles CW/CC x5 each  Seated LAQ x 10 bil 3" hold  Standing fwd and back WS x 10 R/L alternate/ side to side   THERAPEUTIC ACTIVITIES: Re-assessment of Knee ROM    PATIENT EDUCATION:  Education details:  AFO management and skin monitoring   Person educated: Patient Education method: Explanation, Demonstration, and  Verbal cues Education comprehension: verbalized understanding and verbal cues required  HOME EXERCISE PROGRAM: Access Code: [email or web address]: [email or web address] Date: 10/03/2022 Prepared by: JAlmyra Free Exercises - Supine Heel Slide with Strap  - 2 x daily - 7 x weekly - 2 sets - 10 reps - 3 sec hold - Hooklying Single Knee to Chest Stretch  - 2 x daily - 7 x weekly - 3 reps - 30 sec hold - Seated Hamstring Curls with Resistance  - 1 x daily - 7 x weekly - 2 sets - 10 reps - 3 sec hold - Seated Knee Extension with Resistance  - 1 x daily - 7 x weekly - 2 sets - 10 reps - Standing March with Counter Support  - 1 x daily - 7 x weekly - 2 sets - 10 reps - Seated Hamstring Stretch  - 2 x daily - 7 x weekly - 3 reps - 30 sec hold - Standing ITB Stretch  - 2 x daily - 7 x weekly - 3 reps - 30 sec hold - Standing Hip Flexor Stretch  - 2 x daily - 7 x weekly - 2 sets - 10 reps - 30 sec hold - Seated Hip Internal Rotation AROM (Mirrored)  - 1 x daily - 7 x weekly - 3 sets - 10 reps  ASSESSMENT:  CLINICAL IMPRESSION:  Pt came into today's session not feeling great after having some bowel problems due to a hemorrhoids he has been dealing with. Pt brought in  his AFO today and PT assessed fit and educated pt on what to look out for regarding skin care for pressure wounds and how to properly place on and off. Pt verbalized some concern with getting the AFO on due to inability to bend down but PT advised pt to try utilizing a long handle shoe horn to ease the transition and showed him how to perform this. Gait training was conducted with the AFO to normalize gait pattern as well as increase foot clearance height when stepping through. Pt required tactile and verbal cues when attempting a step through pattern with gait but he was able to normalize towards the end of the training as he was able to clear his L leg with increased ease. Due to pt stating he had difficulty getting into his truck due to the  height of the step, therapeutic exercise involved step ups with the AFO to try and normalize movement while strengthening hip musculature. Pt continues to require frequent breaks between exercises and gets fatigued easily. Pt continues to benefit from skilled PT interventions to address his gait abnormalities while strengthening his LE to facilitate movement and quality of life.   OBJECTIVE IMPAIRMENTS: Abnormal gait, decreased activity tolerance, decreased balance, decreased coordination, decreased endurance, decreased knowledge of condition, decreased knowledge of use of DME, decreased mobility, difficulty walking, decreased ROM, decreased strength, decreased safety awareness, increased fascial restrictions, impaired perceived functional ability, impaired flexibility, impaired sensation, improper body mechanics, postural dysfunction, and pain.   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   REHAB POTENTIAL: Good  CLINICAL DECISION MAKING: Evolving/moderate complexity  EVALUATION COMPLEXITY: Moderate   GOALS: Goals reviewed with patient?  Yes  SHORT TERM GOALS: Target date: 10/03/2022   Patient will be independent with initial HEP. Baseline:  Goal status: MET  2.  Complete balance testing and establish additional LTGs as appropriate. Baseline:  Goal status: MET 09/07/22  3.  Patient will demonstrate decreased TUG time to </= 16 sec to decrease risk for falls with transitional mobility Baseline: 21.28 sec, 10/03/22 - 28.23 sec Goal status: IN PROGRESS  LONG TERM GOALS: Target date: 10/31/2022, extended to 12/01/22   Patient will be independent with advanced/ongoing HEP to improve outcomes and carryover.  Baseline:  Goal status: IN PROGRESS  2.  Patient will report at least 50-75% improvement in low back and Lhip pain to improve QOL. Baseline: 7/10 Goal status: IN PROGRESS  3.  Patient will demonstrate improved L knee AROM to [facility] to allow for normal gait and stair mechanics. Baseline: AROM 0-92, PROM 0-95 Goal status: IN PROGRESS  10/17/22- L knee AROM 0-98  4.  Patient will demonstrate improved B LE strength to >/= 4 to 4+/5 for improved stability and ease of mobility. Baseline:  Goal status: IN PROGRESS  10/17/22 - refer to above MMT table  5.  Patient will be  able to ambulate 600' with LRAD and normal gait pattern, good stability and without increased pain to safely access community.  Baseline:  Goal status: IN PROGRESS  6. Patient will be able to ascend/descend stairs with 1 HR and reciprocal step pattern safely to access home and community.  Baseline:  Goal status: IN PROGRESS  10/17/22 - able to complete stairs with 1HR however resulting to step to pattern naturally  7.  Patient will report >/=28/80 on LEFS (patient reported outcome measure) to demonstrate improved functional ability. Baseline: 19 / 80 = 23.8 % Goal status: IN PROGRESS  8.  Patient will demonstrate at least 19/24 on DGI to decrease risk of falls. Baseline: 10/24 (09/07/22) Goal status: IN PROGRESS   9.  Patient will improve Berg score by  at least 8 points to improve safety and stability with ADLs in standing and reduce risk for falls Baseline: 38/56 (09/07/22) Goal status: IN PROGRESS   PLAN:  PT FREQUENCY: 2x/week  PT DURATION: 6 weeks  PLANNED INTERVENTIONS: Therapeutic exercises, Therapeutic activity, Neuromuscular re-education, Balance training, Gait training, Patient/Family education, Self Care, Joint mobilization, Stair training, DME instructions, Dry Needling, Electrical stimulation, Cryotherapy, Moist heat, scar mobilization, Taping, Ultrasound, Ionotophoresis 44m/ml Dexamethasone, Manual therapy, and Re-evaluation  PLAN FOR NEXT SESSION: Ask how AFO wearing at home went; reassess TUG for outstanding STG; reassess Berg and DGI in upcoming visits; continue with LE flexibility and ROM exercises, work on balance and strengthening exercises as able, update HEP accordingly.    Khristian Phillippi Romero-Perozo, Student-PT 11/02/2022, 10:45 AM

## 2022-11-06 NOTE — Therapy (Signed)
OUTPATIENT PHYSICAL THERAPY TREATMENT       Patient Name: [name] Pratt MRN: [id number] DOB:07/28/1941, 82 y.o., male Today's Date: 11/07/2022   END OF SESSION:  PT End of Session - 11/07/22 1349     Visit Number 15    Number of Visits 22    Date for PT Re-Evaluation 12/01/22    Authorization Type [name]    Authorization Time Period through 12/01/22    Progress Note Due on Visit 20    PT Start Time 1300    PT Stop Time 1348    PT Time Calculation (min) 48 min    Activity Tolerance Patient tolerated treatment well    Behavior During Therapy [facility] for tasks assessed/performed                      Past Medical History:  Diagnosis Date   Allergic rhinitis 02/02/2016   Anemia 12/17/2009   Formatting of this note might be different from the original. Anemia  10/1 IMO update   Aortic atherosclerosis ([place]) 03/05/2020   Arthritis    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   Atypical chest pain 11/24/2015   Benign essential hypertension 05/13/2009   Qualifier: Diagnosis of  By: Larwance Sachs of this note might be different from the original. Hypertension   Benign paroxysmal positional vertigo 09/14/2021   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[place]) 07/22/2021   Body mass index (BMI) 25.0-25.9, adult 11/02/2020   Cancer ([place])    skin cancer   Cardiac murmur 07/25/2021   Carotid stenosis    a. Carotid U/S [id number]: LICA < [id number], RICA 0000000;  b.  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   Cystoid macular edema of both eyes 07/28/2020   Degenerative disc disease, lumbar 06/30/2015   Deviated nasal  septum 02/18/2015   Diverticulosis 03/03/2020   DJD (degenerative joint disease) of hip 12/24/2012   Dyspnea 06/12/2011   Emphysema, unspecified ([place]) 03/05/2020   Erectile dysfunction 06/20/2017   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[place]) 02/09/2021   Gait disorder 03/02/2021   GERD (gastroesophageal reflux disease)    GOUT 05/13/2009   Qualifier: Diagnosis of  By: Burnett Kanaris     Greater trochanteric pain syndrome 07/22/2021   Hand pain, left 01/16/2022   Hearing loss 02/02/2016   Heme positive stool 02/29/2020   HIATAL HERNIA 05/13/2009   Qualifier: Diagnosis of  By: Burnett Kanaris     High risk medication use 01/18/2012   Hip pain 03/02/2021   History of chicken pox    History of COVID-19 12/05/2021   History of hemorrhoids    History of kidney stones    History of skin cancer 07/22/2021   skin cancer   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 Cardiology - Dr Frances Nickels at [facility] cardiology  ICD-10 cut over   Hypertension    under control   Hypokalemia 07/08/2021   Hypothyroidism  Internal hemorrhoids 03/03/2020   Leg cramps 11/13/2019   Lower GI bleed 12/12/2012   Lumbago of lumbar region with sciatica 10/21/2020   Lumbar radiculopathy 03/02/2021   Lumbar spondylosis 11/02/2020   NEPHROLITHIASIS 05/13/2009   Qualifier: Diagnosis of  By: Burnett Kanaris     Nonspecific abnormal electrocardiogram (ECG) (EKG) 01/06/2013   Numbness of hand 01/24/2022   Onychomycosis 06/30/2015   Osteoarthrosis, 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 Osteoarthritis Of The Hand  10/1 IMO update   Osteoarthrosis, unspecified whether generalized or localized, pelvic region and thigh 07/25/2010   Formatting of this note  might be different from the original. Osteoarthritis Of The Hip   Other abnormal glucose 07/22/2021   Other allergic rhinitis 02/18/2015   Palpitations 07/25/2021   Peripheral neuropathy 06/19/2016   Preventative health care 11/24/2015   Right groin pain 11/29/2012   Right inguinal hernia 05/13/2009   Qualifier: Diagnosis of  By: Burnett Kanaris     RLS (restless legs syndrome) 02/18/2015   Rosacea    Rotator cuff tear 07/27/2013   Situational depression 03/02/2021   Skin lesion 01/16/2022   Sleep apnea with use of continuous positive airway pressure (CPAP) 07/15/2013   CPAP set to  7 cm water,  Residual AHi was 7.8  With no leak.  User time 6 hours.  479 days , not used only 12 days - highly compliant 06-23-13  .    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   Urinary retention 11/26/2020   Ventral hernia 07/08/2012   Vertigo 07/08/2021   Weight loss 03/03/2020   Past Surgical History:  Procedure Laterality Date   ABDOMINAL HERNIA REPAIR  07/15/12   Dr [name]   ANTERIOR CERVICAL DECOMP/DISCECTOMY FUSION N/A 07/09/2020   Procedure: ANTERIOR CERVICAL DECOMPRESSION AND FUSION CERVICAL THREE-FOUR.;  Surgeon: [name], MD;  Location: [place];  Service: Neurosurgery;  Laterality: N/A;  anterior   BROW LIFT  05/07/01   COLONOSCOPY WITH PROPOFOL N/A 04/24/2022   Procedure: COLONOSCOPY WITH PROPOFOL;  Surgeon: Thornton Park, MD;  Location: [place];  Service: Gastroenterology;  Laterality: N/A;   COLONOSCOPY WITH PROPOFOL N/A 04/26/2022   Procedure: COLONOSCOPY WITH PROPOFOL;  Surgeon: Thornton Park, MD;  Location: [place];  Service:  Gastroenterology;  Laterality: N/A;   CYSTOSCOPY WITH URETEROSCOPY AND STENT PLACEMENT Right 01/28/2014   Procedure: CYSTOSCOPY WITH URETEROSCOPY, BASKET RETRIVAL AND  STENT PLACEMENT;  Surgeon: [name], MD;  Location: [facility];  Service: Urology;  Laterality: Right;   epidural injections     multiple procedures   ESOPHAGEAL DILATION  1993 and 1994   multiple times   EYE SURGERY Bilateral 03/25/10, 2012   cataract removal   HEMORRHOID SURGERY  2014   HIATAL HERNIA REPAIR  12/21/93   HOLMIUM LASER APPLICATION Right [id number]   Procedure: HOLMIUM LASER APPLICATION;  Surgeon: [name], MD;  Location: [facility];  Service: Urology;  Laterality: Right;   INGUINAL HERNIA REPAIR Right 07/15/12   Dr [name], x2   JOINT REPLACEMENT  07/25/04   right knee   JOINT REPLACEMENT  07/13/10   left knee   [place]  LAPAROSCOPIC CHOLECYSTECTOMY  09/20/06   with intraoperative cholangiogram and right inguinal herniorrhaphy with mesh    LIGAMENT REPAIR Left 07/2013   shoulder   LITHOTRIPSY     x2   NASAL SEPTUM SURGERY  1975   POLYPECTOMY  04/26/2022   Procedure: POLYPECTOMY;  Surgeon: Thornton Park, MD;  Location: [place];  Service: Gastroenterology;;   POSTERIOR CERVICAL FUSION/FORAMINOTOMY N/A 11/24/2020   Procedure: Posterior Cervical Laminectomy Cervical three-four, Cervical four-five with lateral mass fusion;  Surgeon: [name], MD;  Location: [place];  Service: Neurosurgery;  Laterality: N/A;   REFRACTIVE SURGERY Bilateral    Right total hip replacement  4/14   SKIN CANCER DESTRUCTION     nose, ear   TONSILLECTOMY  as child   TOTAL HIP ARTHROPLASTY Left    URETHRAL DILATION  1991   [name]   Patient Active Problem List   Diagnosis Date Noted   Weakness of lower extremity 10/10/2022   Skin tear of right elbow without complication [id number]   Skin tear of left lower leg without complication [id number]   Fall 09/18/2022   Iron deficiency anemia 09/18/2022   S/P  revision of total hip 08/28/2022   Vitamin D deficiency 08/07/2022   Closed fracture of left hip ([place]) 08/07/2022   Hand weakness 05/02/2022   Acute kidney injury ([place]) 05/02/2022   Adenomatous polyp of ascending colon    Adenomatous polyp of colon    Acute metabolic encephalopathy 0000000   GI bleed 04/21/2022   Vasovagal syncope    Hemorrhagic shock (HCC)    Hematochezia    Irregular heart beat 02/03/2022   Constipation 02/03/2022   Carpal tunnel syndrome 01/24/2022   Numbness of hand 01/24/2022   Ulnar neuropathy 01/24/2022   Skin lesion 01/16/2022   History of COVID-19 12/05/2021   Benign paroxysmal positional vertigo 09/14/2021   Palpitations 07/25/2021   Cardiac murmur 07/25/2021   History of chicken pox 07/22/2021   History of hemorrhoids 07/22/2021   Hypertension 07/22/2021   COPD (chronic obstructive pulmonary disease) ([place]) 07/22/2021   History of kidney stones 07/22/2021   History of skin cancer [id number]   Complication of anesthesia 07/22/2021   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[place]) 07/22/2021   Other abnormal glucose 07/22/2021   Spondylitis ([place]) 07/22/2021   Greater trochanteric pain syndrome 07/22/2021   Vertigo 07/08/2021   Hypokalemia 07/08/2021   Trochanteric bursitis of left hip 06/30/2021   Cancer ([place]) 04/11/2021   Lumbar radiculopathy 03/02/2021   Gait instability 03/02/2021   Hip pain 03/02/2021   Situational depression 03/02/2021   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[place]) 02/09/2021   Urinary retention 11/26/2020   Cervical myelopathy ([place]) 11/24/2020   Body mass index (BMI) 25.0-25.9, adult 11/02/2020   Lumbar spondylosis 11/02/2020   Lumbago of lumbar region with sciatica 10/21/2020   Cystoid macular edema of both eyes 07/28/2020   Status post cervical spinal fusion 07/09/2020   History of total bilateral knee replacement 07/08/2020   Aortic atherosclerosis ([place]) 03/05/2020    Emphysema, unspecified ([place]) 03/05/2020   Arthritis 03/04/2020   Diverticulosis 03/03/2020   Internal hemorrhoids 03/03/2020   Weight loss 03/03/2020   Heme positive stool 02/29/2020   Leg cramps 11/13/2019   Tibialis anterior tendon tear, nontraumatic 11/13/2019   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 07/29/2018   CPAP (continuous positive airway pressure) dependence 03/25/2018   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   Erectile dysfunction 06/20/2017   Stenosis of right carotid artery without  cerebral infarction 03/06/2017   Peripheral neuropathy 06/19/2016   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   Hearing loss 02/02/2016   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   Allergic rhinitis 02/02/2016   Atypical chest pain 11/24/2015   Preventative health care 11/24/2015   Onychomycosis 06/30/2015   Degenerative disc disease, lumbar 06/30/2015   Other allergic rhinitis 02/18/2015   Deviated nasal septum 02/18/2015   RLS (restless legs syndrome) 02/18/2015   GERD (gastroesophageal reflux disease) 09/18/2014   Hyperglycemia 03/03/2014   Rotator cuff tear 07/27/2013   Sleep apnea with use of continuous positive airway pressure (CPAP) 07/15/2013   Carotid stenosis 02/24/2013   Nonspecific abnormal electrocardiogram (ECG) (EKG) 01/06/2013   DJD (degenerative joint disease) of hip 12/24/2012   Lower GI bleed 12/12/2012   Right groin pain 11/29/2012   OSA (obstructive sleep apnea) 11/25/2012   Ventral hernia 07/08/2012   High risk medication use 01/18/2012   Dyspnea 06/12/2011   Hyperlipidemia with target LDL less than 130 12/27/2010   Osteoarthrosis, unspecified whether generalized or localized, pelvic region and thigh 07/25/2010   Osteoarthrosis, hand 06/13/2010   Anemia 12/17/2009   Hypothyroidism 05/13/2009   GOUT 05/13/2009   Benign essential hypertension 05/13/2009   Right inguinal hernia 05/13/2009   HIATAL HERNIA 05/13/2009   NEPHROLITHIASIS 05/13/2009    ROSACEA 05/13/2009    PCP: [name], NP   REFERRING PROVIDER: [name], NP   REFERRING DIAG: [phone] (ICD-10-CM) - S/P revision of total hip  PT eval and treat for aggressive left knee ROM, weight bearing as tolerated, balance/gait training. S/p revision of left hip replacement at [facility]. (Dr. Linton Rump). No precautions.  THERAPY DIAG:  Stiffness of left knee, not elsewhere classified  Muscle weakness (generalized)  Unsteadiness on feet  Other abnormalities of gait and mobility  Repeated falls  Other low back pain  RATIONALE FOR EVALUATION AND TREATMENT: Rehabilitation  ONSET DATE: 07/02/22  NEXT MD VISIT:  pt will look and let us know when    SUBJECTIVE:                                                                                                                                                                                                         SUBJECTIVE STATEMENT:  Reports fall to the right into cabinet in bedroom, tearing forearm skin.  I can't get the AFO on by myself.  PAIN: PAIN:  Are you having pain? Yes: NPRS scale: 0/10 Pain location: low back and groin Pain description: ache Aggravating factors: movement Relieving factors: rest     PERTINENT  HISTORY:  Revision of femoral component of L THA on 07/02/22 s/p fall with periprosthetic fracture; Aortic atherosclerosis; carotid stenosis; cervical myelopathy s/p ACDF 06/2020 and posterior cervical fusion 11/2020; peripheral neuropathy; lumbar DDD/spondylosis; lumbago with sciatica/lumbar radiculopathy; OA; B TKA; BTHA; L greater trochanteric pain syndrome; L RCR 2014; recent CTR surgery - early May; hearing loss; HTN; BPPV/vertigo; B sacroiliitis; skin cancer; COPD/emphysema; sleep apnea with CPAP; GERD; gout; HLD; LE cramps; RLS   PRECAUTIONS: Fall  WEIGHT BEARING RESTRICTIONS: No - FWB on left as of 08/18/22  FALLS:  Has patient fallen in last 6 months? Yes. 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 The proximal femur fracture looks healed as I cannot see the fracture line in any view. Good leg lengths.  Right hip replacement is cementless and looks well-fixed with a little varus of the tibial stem.  No bony lesion or arthritic change or other abnormality except some lumbar disc degeneration   07/21/2022 - ORTHO XR PELVIS 2 OR 3 VW UNILATERAL HIP: FINDINGS: AP and lateral views of left hip were obtained and interpreted.  Status post new left total hip revision with wire fixation appearing in good position. Good limb length. No new fracture, bony lesion, or other abnormal findings.  07/21/2022 - ORTHO XR FEMUR 2 VW UNILATERAL: FINDINGS: 3 views of the left femur were obtained and interpreted. Status post new left total hip revision with wire fixation in good position. Previous left total knee arthroplasty. No new fracture, bony lesion, or other abnormal findings.   07/02/2022 - DG HIP (WITH OR WITHOUT PELVIS) 2-3V LEFT :  FINDINGS: Three fluoroscopic spot views of the hip obtained in the operating room. [place]clage wire  fixation of periprosthetic fracture, in improved alignment. Fluoroscopy time 12 seconds. Dose 1.23 mGy. IMPRESSION: Fluoroscopic spot views of the hip during periprosthetic fracture fixation.   07/01/2022 - XR THIGH/FEMUR LEFT 2V & XR PELVIS LIMITED 1 OR 2 VIEWS AP: FINDINGS: Degenerative changes in the lower lumbar spine. Pelvis is intact. SI joints and symphysis pubis are not displaced. Right total hip arthroplasty with non cemented components. Tip of the arthroplasty is not included within the field of view. Visualized components appear well seated. Left total hip arthroplasty. The left hip demonstrates an acute periprosthetic fracture of the proximal femur with lateral displacement and mild distraction of the distal fracture fragment. No evidence of dislocation of the prosthesis. The midshaft and distal left femur are intact. Postoperative left knee arthroplasty. IMPRESSION: 1. Left hip arthroplasty with periprosthetic fracture of the proximal femur. 2. Incidental note of right hip and left knee  arthroplasties.   PATIENT SURVEYS:  LEFS 19 / 80 = 23.8 %  COGNITION: Overall cognitive status: Within functional limits for tasks assessed    SENSATION: Peripheral neuropathy in B feet    EDEMA:  Mild to moderate edema in distal B LE, L>R  MUSCLE LENGTH: (09/07/22) Hamstrings: mod tight R>L ITB: mod tight B Piriformis: mod/severe tight B Hip flexors:  Quads:  Heelcord:   POSTURE:  rounded shoulders, forward head, flexed trunk , and weight shift right  PALPATION: Increased muscle tension throughout left quads and IT band as well as hamstrings  LOWER EXTREMITY ROM:  Active ROM Right eval Left eval Left 2/1 and 2/6 Left 10/26/22  Hip flexion      Hip extension      Hip abduction      Hip adduction      Hip internal rotation      Hip external rotation      Knee flexion 115 92 98 95  Knee extension 0 0    Ankle dorsiflexion  limited    Ankle plantarflexion      Ankle inversion       Ankle eversion       Passive ROM Left eval Left 10/26/22  Knee flexion 95 95  (Blank rows = not tested)  LOWER EXTREMITY MMT:  MMT Right Eval* Left Eval* Right 2/1 Left 2/1 Right 10/17/22 Left 10/17/22  Hip flexion 3+ 3+ 3+ 3+ 3+ 3+  Hip extension 4- 4- 4- 4-    Hip abduction 4 4- 4 4- 4 4  Hip adduction 4 4 4+ 4+ 4+ 4+  Hip internal rotation        Hip external rotation        Knee flexion '4 4 4 '$ 4- 4- 4-  Knee extension 4+ 4+ 4+ 4+ 4 4  Ankle dorsiflexion 4+ 4      Ankle plantarflexion        Ankle inversion        Ankle eversion         (Blank rows = not tested, * - tested in sitting)  FUNCTIONAL TESTS:  5 times sit to stand: 14.22 sec (09/07/22) Timed up and go (TUG): 21.28 sec with SPC; 28.23 sec (10/03/22), 27.17 sec with 4WRW on 11/07/22 10 meter walk test: 19.94 sec with SPC; Gait speed = 1.63 ft/sec Berg Balance Scale: 38/56; 37-45 significant risk for falls (>80%)  (09/07/22) Dynamic Gait Index: 10/24; Scores of 19 or less are predicitve of falls in older community living adults (09/07/22)  PLOF (As of 06/20/22): ABC scale: 730 / 1600 = 45.6% Berg: 48/56; 46-51 moderate fall risk (>50%)  10MWT = 11.50 sec with SPC; 10.06 sec w/o AD Gait speed: 2.85 ft/sec with SPC; 3.26 ft/sec w/o AD DGI: 18/24; Scores of 19 or less are predictive of falls in older community living adults  GAIT: Distance walked: 60 ft Assistive device utilized: Single point cane Level of assistance: SBA Gait pattern: step to pattern, step through pattern, decreased step length- Right, decreased stance time- Left, decreased hip/knee flexion- Right, and decreased hip/knee flexion- Left Comments: Inconsistent step length on right creating a barrier to versus step through pattern.  Notable gait instability with SPC.   TODAY'S TREATMENT:   11/07/22 TUG  and  THERAPEUTIC EXERCISE: to improve flexibility, strength and mobility.  Verbal and tactile cues throughout for technique. Standing Marching  x10 each Seated LAQ x10 each  Standing HS Curls x10 each Seated hip ABD RTB x  10 bil  Step Ups 6" x 5 each - increased difficulty stepping up with L foot, SBA/CGA via gait belt with min A for correction of LOB x 1   11/02/22  GAIT TRAINING: To normalize gait pattern, improve safety with RW, and assess gait with L posterior lateral strut AFO . Distance walked: 176f Assistive device utilized: WEnvironmental consultant- 2 wheeled Level of assistance: Modified independence Gait pattern: step to pattern, step through pattern, and decreased stride length Comments: increased step length and better toe clearance with L foot usage of AFO and improved gait pattern with faster gait speed.   THERAPEUTIC ACTIVITIES: AFO education - repeated donning/doffing of AFO to help get pt independent. Patient unable to use extended shoe horn due to deficits in hands. He did best with sitting and getting his shoe prepared with AFO inserted, then standing in walker and placing forefoot into shoe, then sitting down and pushing foot into shoe completely. Some difficulty with tongue of shoe being in the way, so on last effort placed a rubberband around shoe and tongue to hold it out of the way until AFO was donned. Then tongue released from rubberband. Patient advised to only do this with UE support and with chair available. Reminded to monitor for friction/skin breakdown  Partial DGI completed with [id number] Last 3 items to be tested next visit. TUG completed 27.17 sec with 4WRW   10/31/22 THERAPEUTIC EXERCISE: to improve flexibility, strength and mobility.  Verbal and tactile cues throughout for technique. NuStep L5 x 6 min  Seated march x 10 bil Seated LAQ x 10 bil Seated hip ABD RTB x 10 bil Seated row 15# x 10 Knee flexion 10# BLE x 10: 5# LLE x 10 Knee extension 15# BLE x 10; 5 LLE x 10 Gait 90 ft w/ rolling walker Standing march x 10 bil Standing hip abduction x 10 bil    PATIENT EDUCATION:  Education details:  AFO  management and skin monitoring   Person educated: Patient Education method: Explanation, Demonstration, and Verbal cues Education comprehension: verbalized understanding and verbal cues required  HOME EXERCISE PROGRAM: Access Code: [email or web address]: [email or web address] Date: 10/03/2022 Prepared by: JAlmyra Free Exercises - Supine Heel Slide with Strap  - 2 x daily - 7 x weekly - 2 sets - 10 reps - 3 sec hold - Hooklying Single Knee to Chest Stretch  - 2 x daily - 7 x weekly - 3 reps - 30 sec hold - Seated Hamstring Curls with Resistance  - 1 x daily - 7 x weekly - 2 sets - 10 reps - 3 sec hold - Seated Knee Extension with Resistance  - 1 x daily - 7 x weekly - 2 sets - 10 reps - Standing March with Counter Support  - 1 x daily - 7 x weekly - 2 sets - 10 reps - Seated Hamstring Stretch  - 2 x daily - 7 x weekly - 3 reps - 30 sec hold - Standing ITB Stretch  - 2 x daily - 7 x weekly - 3 reps - 30 sec hold - Standing Hip Flexor Stretch  - 2 x daily - 7 x weekly - 2 sets - 10 reps - 30 sec hold - Seated Hip Internal Rotation AROM (Mirrored)  - 1 x daily - 7 x weekly - 3 sets - 10 reps  ASSESSMENT:  CLINICAL IMPRESSION:  FJosph Machoreports an almost fall this morning when transferring his urine pan from one cabinet to another. He fell left  and scraped his R forearm. Wound is clean and bandaged and fall did not affect previous wounds in forearm. He is having difficulty donning AFO, so we focused treatment on getting him independent with this. He is not fully there yet but we found a method that may work (see note). TUG and partial DGI completed as well as further gait training with AFO. [name] Macho is still a significant fall risk based. He will benefit from ongoing practice with donning AFO.   OBJECTIVE IMPAIRMENTS: Abnormal gait, decreased activity tolerance, decreased balance, decreased coordination, decreased endurance, decreased knowledge of condition, decreased knowledge of use of DME, decreased  mobility, difficulty walking, decreased ROM, decreased strength, decreased safety awareness, increased fascial restrictions, impaired perceived functional ability, impaired flexibility, impaired sensation, improper body mechanics, postural dysfunction, and pain.   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   REHAB POTENTIAL: Good  CLINICAL DECISION MAKING: Evolving/moderate complexity  EVALUATION COMPLEXITY: Moderate   GOALS: Goals reviewed with patient? Yes  SHORT TERM GOALS: Target date: 10/03/2022   Patient will be independent with initial HEP. Baseline:  Goal status: MET  2.  Complete balance testing and establish additional LTGs as appropriate. Baseline:  Goal status: MET 09/07/22  3.  Patient will demonstrate decreased TUG time to </= 16 sec to decrease risk for falls with transitional mobility Baseline: 21.28 sec, 10/03/22 - 28.23 sec, 11/07/21 27.17 sec Goal status: IN PROGRESS  [place] TERM GOALS: Target date: 10/31/2022, extended to 12/01/22   Patient will be independent with advanced/ongoing HEP to improve outcomes and  carryover.  Baseline:  Goal status: IN PROGRESS  2.  Patient will report at least 50-75% improvement in low back and Lhip pain to improve QOL. Baseline: 7/10 Goal status: IN PROGRESS  3.  Patient will demonstrate improved L knee AROM to [place] to allow for normal gait and stair mechanics. Baseline: AROM 0-92, PROM 0-95 Goal status: IN PROGRESS  10/17/22- L knee AROM 0-98  4.  Patient will demonstrate improved B LE strength to >/= 4 to 4+/5 for improved stability and ease of mobility. Baseline:  Goal status: IN PROGRESS  10/17/22 - refer to above MMT table  5.  Patient will be able to ambulate 600' with LRAD and normal gait pattern, good stability and without increased pain to safely access community.  Baseline:  Goal status: IN PROGRESS  6. Patient will be able to ascend/descend stairs with 1 HR and reciprocal step pattern safely to access home and community.  Baseline:  Goal status: IN PROGRESS  10/17/22 - able to complete stairs with 1HR however resulting to step to pattern naturally  7.  Patient will report >/=28/80 on LEFS (patient reported outcome measure) to demonstrate improved functional ability. Baseline: 19 / 80 = 23.8 % Goal status: IN PROGRESS  8.  Patient will demonstrate at least 19/24 on DGI to decrease  risk of falls. Baseline: 10/24 (09/07/22) Goal status: IN PROGRESS   9.  Patient will improve Berg score by at least 8 points to improve safety and stability with ADLs in standing and reduce risk for falls Baseline: 38/56 (09/07/22) Goal status: IN PROGRESS   PLAN:  PT FREQUENCY: 2x/week  PT DURATION: 6 weeks  PLANNED INTERVENTIONS: Therapeutic exercises, Therapeutic activity, Neuromuscular re-education, Balance training, Gait training, Patient/Family education, Self Care, Joint mobilization, Stair training, DME instructions, Dry Needling, Electrical stimulation, Cryotherapy, Moist heat, scar mobilization, Taping, Ultrasound, Ionotophoresis '4mg'$ /ml Dexamethasone,  Manual therapy, and Re-evaluation  PLAN FOR NEXT SESSION: Ask how AFO wearing at home went;  reassess Berg and DGI in upcoming visits (partially completed 2/27); continue with LE flexibility and ROM exercises, work on balance and strengthening exercises as able, update HEP accordingly.    Jadzia Ibsen, PT 11/07/2022, 3:07 PM

## 2022-11-07 ENCOUNTER — Encounter: Payer: Self-pay | Admitting: Physical Therapy

## 2022-11-07 ENCOUNTER — Ambulatory Visit: Payer: Medicare HMO | Admitting: Physical Therapy

## 2022-11-07 DIAGNOSIS — M25662 Stiffness of left knee, not elsewhere classified: Secondary | ICD-10-CM

## 2022-11-07 DIAGNOSIS — M5459 Other low back pain: Secondary | ICD-10-CM

## 2022-11-07 DIAGNOSIS — M6281 Muscle weakness (generalized): Secondary | ICD-10-CM | POA: Diagnosis not present

## 2022-11-07 DIAGNOSIS — R296 Repeated falls: Secondary | ICD-10-CM | POA: Diagnosis not present

## 2022-11-07 DIAGNOSIS — R2681 Unsteadiness on feet: Secondary | ICD-10-CM | POA: Diagnosis not present

## 2022-11-07 DIAGNOSIS — R2689 Other abnormalities of gait and mobility: Secondary | ICD-10-CM

## 2022-11-09 ENCOUNTER — Ambulatory Visit: Payer: Medicare HMO

## 2022-11-09 DIAGNOSIS — L905 Scar conditions and fibrosis of skin: Secondary | ICD-10-CM | POA: Diagnosis not present

## 2022-11-09 DIAGNOSIS — R234 Changes in skin texture: Secondary | ICD-10-CM | POA: Diagnosis not present

## 2022-11-11 ENCOUNTER — Ambulatory Visit (HOSPITAL_BASED_OUTPATIENT_CLINIC_OR_DEPARTMENT_OTHER): Payer: Medicare HMO

## 2022-11-14 ENCOUNTER — Ambulatory Visit: Payer: Medicare HMO | Attending: Family | Admitting: Physical Therapy

## 2022-11-14 ENCOUNTER — Emergency Department (HOSPITAL_BASED_OUTPATIENT_CLINIC_OR_DEPARTMENT_OTHER)
Admission: EM | Admit: 2022-11-14 | Discharge: 2022-11-14 | Disposition: A | Discharge status: Home / Self Care | Payer: Medicare HMO | Attending: Emergency Medicine | Admitting: Emergency Medicine

## 2022-11-14 ENCOUNTER — Encounter (HOSPITAL_BASED_OUTPATIENT_CLINIC_OR_DEPARTMENT_OTHER): Payer: Self-pay

## 2022-11-14 ENCOUNTER — Other Ambulatory Visit: Payer: Self-pay

## 2022-11-14 ENCOUNTER — Encounter: Payer: Self-pay | Admitting: Physical Therapy

## 2022-11-14 ENCOUNTER — Emergency Department (HOSPITAL_BASED_OUTPATIENT_CLINIC_OR_DEPARTMENT_OTHER): Payer: Medicare HMO

## 2022-11-14 DIAGNOSIS — M6281 Muscle weakness (generalized): Secondary | ICD-10-CM | POA: Insufficient documentation

## 2022-11-14 DIAGNOSIS — R296 Repeated falls: Secondary | ICD-10-CM | POA: Diagnosis not present

## 2022-11-14 DIAGNOSIS — Z79899 Other long term (current) drug therapy: Secondary | ICD-10-CM | POA: Insufficient documentation

## 2022-11-14 DIAGNOSIS — S0101XA Laceration without foreign body of scalp, initial encounter: Secondary | ICD-10-CM | POA: Diagnosis not present

## 2022-11-14 DIAGNOSIS — R2689 Other abnormalities of gait and mobility: Secondary | ICD-10-CM | POA: Diagnosis not present

## 2022-11-14 DIAGNOSIS — R2681 Unsteadiness on feet: Secondary | ICD-10-CM | POA: Insufficient documentation

## 2022-11-14 DIAGNOSIS — W01198A Fall on same level from slipping, tripping and stumbling with subsequent striking against other object, initial encounter: Secondary | ICD-10-CM | POA: Diagnosis not present

## 2022-11-14 DIAGNOSIS — J449 Chronic obstructive pulmonary disease, unspecified: Secondary | ICD-10-CM | POA: Diagnosis not present

## 2022-11-14 DIAGNOSIS — Z87891 Personal history of nicotine dependence: Secondary | ICD-10-CM | POA: Insufficient documentation

## 2022-11-14 DIAGNOSIS — S199XXA Unspecified injury of neck, initial encounter: Secondary | ICD-10-CM | POA: Diagnosis not present

## 2022-11-14 DIAGNOSIS — I10 Essential (primary) hypertension: Secondary | ICD-10-CM | POA: Diagnosis not present

## 2022-11-14 DIAGNOSIS — S0990XA Unspecified injury of head, initial encounter: Secondary | ICD-10-CM

## 2022-11-14 DIAGNOSIS — M5459 Other low back pain: Secondary | ICD-10-CM | POA: Diagnosis not present

## 2022-11-14 DIAGNOSIS — Z7982 Long term (current) use of aspirin: Secondary | ICD-10-CM | POA: Insufficient documentation

## 2022-11-14 DIAGNOSIS — Z7951 Long term (current) use of inhaled steroids: Secondary | ICD-10-CM | POA: Diagnosis not present

## 2022-11-14 DIAGNOSIS — M25662 Stiffness of left knee, not elsewhere classified: Secondary | ICD-10-CM | POA: Insufficient documentation

## 2022-11-14 DIAGNOSIS — W19XXXA Unspecified fall, initial encounter: Secondary | ICD-10-CM

## 2022-11-14 NOTE — Discharge Instructions (Addendum)
Staple removal in 7 to 10 days.  Keep wound dry for 24 hours then can wash with soap and water and apply antibiotic ointment daily..  CT head and CT neck without any acute findings.  Return for any new or worse symptoms.  Follow-up with your doctor or you can return here for staple removal in 7 to 10 days.

## 2022-11-14 NOTE — ED Provider Notes (Signed)
[place] [facility] Provider Note   CSN: [id number] Arrival date & time: 11/14/22  2011     History  Chief Complaint  Patient presents with   Lytle Michaels    [name] Pratt is a 82 y.o. male.  Patient was at home.  He bent over and when he stood up he lost his balance and fell backward hit the back of his head on a bookshelf.  Patient states he had a tetanus shot in the last few years.  No loss of consciousness.  Not on blood thinners.  Patient states that he is wobbly and normally needs to walk with a walker with some chronic bilateral leg weakness.  Denies any hip pain or any new lower extremity pain or upper extremity injury.  Denies any upper back or lower back pain.  Past medical history is significant for gastroesophageal reflux disease hypertension COPD peripheral vascular disease sleep apnea uses CPAP worn is ridiculous past surgical history significant for cholecystectomy in 2008 abdominal hernia repair in 2013 total hip replacement in 2014 hernia repair in 2013 total hip on the left.  Anterior cervical discectomy and fusion in 2021 posterior cervical fusion and foraminotomies in 2022.  Patient former smoker quit 1980.  With the fall patient hit the back of his head on the bookshelf.  Has had bleeding.  From a wound to the left backside of his head.  No loss of consciousness.      Home Medications Prior to Admission medications   Medication Sig Start Date End Date Taking? Authorizing Provider  acetaminophen (TYLENOL) 650 MG CR tablet Take 650-1,300 mg by mouth every 8 (eight) hours as needed for pain.    [provider]  albuterol (VENTOLIN HFA) 108 (90 Base) MCG/ACT inhaler INHALE 2 PUFFS INTO THE LUNGS EVERY 6 HOURS AS NEEDED FOR SHORTNESS OF BREATH. Patient taking differently: Inhale 2 puffs into the lungs every 6 (six) hours as needed for shortness of breath. 10/20/21   [name], NP  allopurinol (ZYLOPRIM) 100 MG tablet  Take 1 tablet (100 mg total) by mouth 2 (two) times daily. 08/07/22   [name], NP  amLODipine (NORVASC) 5 MG tablet Take 1.5 tablets (7.5 mg total) by mouth daily. 09/22/22   [name], NP  antiseptic oral rinse (BIOTENE) LIQD 15 mLs by Mouth Rinse route in the morning.    [provider]  aspirin EC 81 MG tablet Take 81 mg by mouth at bedtime.    [provider]  bacitracin ointment Apply 1 Application topically 2 (two) times daily. 09/15/22   [name], [name], DO  cycloSPORINE (RESTASIS) 0.05 % ophthalmic emulsion Place 1 drop into both eyes 2 (two) times daily.    [provider]  diclofenac Sodium (VOLTAREN) 1 % GEL Apply 2 g topically daily as needed (for arthritic pain in the hands).      docusate sodium (COLACE) 100 MG capsule Take 1 capsule (100 mg total) by mouth 2 (two) times daily. 04/27/22 04/27/23  Barb Merino, MD  doxycycline (VIBRAMYCIN) 100 MG capsule Take 100 mg by mouth 2 (two) times daily. 10/19/22   [provider]  gabapentin (NEURONTIN) 300 MG capsule Take 1 capsule by mouth at bedtime. 12/02/19   [provider]  GEMTESA 75 MG TABS Take 75 mg by mouth daily. 10/20/21   [provider]  glucose blood test strip TRUE Metrix Blood Glucose Test Strip, use as instructed 11/12/20   [name], NP  Homeopathic  Products [facility] RELIEF) FOAM Apply 1 application topically 2 (two) times daily as needed (Pain).    [provider]  hydrALAZINE (APRESOLINE) 50 MG tablet Take 1 tablet (50 mg total) by mouth 2 (two) times daily. 08/07/22   [name], NP  Iron, Ferrous Sulfate, 325 (65 Fe) MG TABS Take 325 mg by mouth every other day. 08/10/22   [name], NP  Lancets MISC 1 each by Does not apply route 2 (two) times daily. TRUE matrix lancets 11/12/20   [name], NP  levothyroxine (SYNTHROID) 150 MCG tablet Take 150 mcg 6 days a week (patient takes 75 mcg once a week) Disp #84  for 90 day supply 08/07/22   [name], NP  levothyroxine (SYNTHROID) 75 MCG tablet Take 75 mcg once a week (patient on 150 mcg 6 days a week) Dispense #24 for 90 day supply. 08/07/22   [name], NP  loratadine (CLARITIN) 10 MG tablet Take 10 mg by mouth daily as needed for allergies or rhinitis.    [provider]  losartan (COZAAR) 100 MG tablet TAKE 1 TABLET EVERY DAY 08/10/22   [name], NP  Magnesium 500 MG TABS Take 500 mg by mouth daily.    [provider]  Multiple Vitamins-Minerals (MULTIVITAMIN WITH MINERALS) tablet Take 1 tablet by mouth daily.    [provider]  nitroGLYCERIN (NITROSTAT) 0.4 MG SL tablet Place 1 tablet (0.4 mg total) under the tongue every 5 (five) minutes as needed for chest pain. 09/16/21   [name], MD  polyethylene glycol (MIRALAX / GLYCOLAX) 17 g packet Take 17-34 g by mouth daily as needed for mild constipation.    [provider]  polyvinyl alcohol (ARTIFICIAL TEARS) 1.4 % ophthalmic solution Place 1 drop into both eyes as needed for dry eyes.    [provider]  PRESCRIPTION MEDICATION CPAP- At bedtime and during any time of rest    [provider]  Probiotic Product (PROBIOTIC DAILY PO) Take 1 capsule by mouth daily.    [provider]  rosuvastatin (CRESTOR) 5 MG tablet Take 1 tablet (5 mg total) by mouth daily. 08/07/22   [name], NP  sodium chloride (OCEAN) 0.65 % nasal spray Place 1 spray into both nostrils as needed for congestion.    [provider]  traMADol (ULTRAM) 50 MG tablet Take 1 tablet (50 mg total) by mouth every 12 (twelve) hours as needed. 01/16/22   [name], NP  vitamin B-12 (CYANOCOBALAMIN) 500 MCG tablet Take 500 mcg by mouth daily.    [provider]      Allergies    Pravastatin, Sildenafil, Oxycodone, Atenolol, Elastic bandages & [zinc], and Metoclopramide hcl    Review of Systems   Review of  Systems  Constitutional:  Negative for chills and fever.  HENT:  Negative for rhinorrhea and sore throat.   Eyes:  Negative for photophobia and visual disturbance.  Respiratory:  Negative for cough and shortness of breath.   Cardiovascular:  Negative for chest pain and leg swelling.  Gastrointestinal:  Negative for abdominal pain, diarrhea, nausea and vomiting.  Genitourinary:  Negative for dysuria.  Musculoskeletal:  Negative for back pain and neck pain.  Skin:  Positive for wound. Negative for rash.  Neurological:  Negative for dizziness, light-headedness and headaches.  Hematological:  Does not bruise/bleed easily.  Psychiatric/Behavioral:  Negative for confusion.     Physical Exam Updated Vital Signs BP 139/73 (BP Location: Right Arm)   Pulse 67  Temp 98 F (36.7 C)   Resp 20   Ht 1.702 m ('5\' 7"'$ )   Wt 72 kg   SpO2 97%   BMI 24.86 kg/m  Physical Exam Vitals and nursing note reviewed.  Constitutional:      General: He is not in acute distress.    Appearance: Normal appearance. He is well-developed.  HENT:     Head: Normocephalic.     Comments: 3 cm laceration to the left posterior parietal area.  Bleeding controlled. Eyes:     Conjunctiva/sclera: Conjunctivae normal.     Pupils: Pupils are equal, round, and reactive to light.  Cardiovascular:     Rate and Rhythm: Normal rate and regular rhythm.     Heart sounds: No murmur heard. Pulmonary:     Effort: Pulmonary effort is normal. No respiratory distress.     Breath sounds: Normal breath sounds.  Abdominal:     Palpations: Abdomen is soft.     Tenderness: There is no abdominal tenderness.  Musculoskeletal:        General: No swelling.     Cervical back: Neck supple.  Skin:    General: Skin is warm and dry.     Capillary Refill: Capillary refill takes less than 2 seconds.  Neurological:     General: No focal deficit present.     Mental Status: He is alert and oriented to person, place, and time.  Psychiatric:         Mood and Affect: Mood normal.     ED Results / Procedures / Treatments   Labs (all labs ordered are listed, but only abnormal results are displayed) Labs Reviewed - No data to display  EKG None  Radiology CT Cervical Spine Wo Contrast  Result Date: 11/14/2022 CLINICAL DATA:  Fall.  Neck trauma (Age >= 65y) EXAM: CT CERVICAL SPINE WITHOUT CONTRAST TECHNIQUE: Multidetector CT imaging of the cervical spine was performed without intravenous contrast. Multiplanar CT image reconstructions were also generated. RADIATION DOSE REDUCTION: This exam was performed according to the departmental dose-optimization program which includes automated exposure control, adjustment of the mA and/or kV according to patient size and/or use of iterative reconstruction technique. COMPARISON:  None Available. FINDINGS: Alignment: No subluxation. Skull base and vertebrae: No acute fracture. No primary bone lesion or focal pathologic process. Soft tissues and spinal canal: No prevertebral fluid or swelling. No visible canal hematoma. Disc levels: Prior anterior fusion at C3-4 and posterior fusion from C3-C5. 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 Upper chest: No acute findings Other: None IMPRESSION: Postoperative and advanced degenerative changes throughout the cervical spine. No acute bony abnormality. Electronically Signed   By: Rolm Baptise M.D.   On: 11/14/2022 21:12   CT Head Wo Contrast  Result Date: 11/14/2022 CLINICAL DATA:  Head trauma, moderate-severe.  Fall. EXAM: CT HEAD WITHOUT CONTRAST TECHNIQUE: Contiguous axial images were obtained from the base of the skull through the vertex without intravenous contrast. RADIATION DOSE REDUCTION: This exam was performed according to the departmental dose-optimization program which includes automated exposure control, adjustment of the mA and/or kV according to patient size and/or use of iterative reconstruction technique. COMPARISON:  09/15/2022  FINDINGS: Brain: There is atrophy and chronic small vessel disease changes. Old right basal ganglia lacunar infarct. No acute intracranial abnormality. Specifically, no hemorrhage, hydrocephalus, mass lesion, acute infarction, or significant intracranial injury. Vascular: No hyperdense vessel or unexpected calcification. Skull: No acute calvarial abnormality. Sinuses/Orbits: No acute findings Other: None IMPRESSION: Old right basal ganglia  lacunar infarct. Atrophy, chronic microvascular disease. No acute intracranial abnormality. Electronically Signed   By: Rolm Baptise M.D.   On: 11/14/2022 21:09    Procedures .Marland KitchenLaceration Repair  Date/Time: 11/14/2022 10:21 PM  Performed by: Fredia Sorrow, MD Authorized by: Fredia Sorrow, MD   Consent:    Consent obtained:  Verbal   Consent given by:  Patient   Risks, benefits, and alternatives were discussed: yes     Risks discussed:  Infection and pain   Alternatives discussed:  No treatment Universal protocol:    Procedure explained and questions answered to patient or proxy's satisfaction: yes     Relevant documents present and verified: yes     Imaging studies available: yes     Site/side marked: no     Immediately prior to procedure, a time out was called: yes     Patient identity confirmed:  Verbally with patient Anesthesia:    Anesthesia method:  None Laceration details:    Location:  Scalp   Scalp location:  L parietal   Length (cm):  3   Depth (mm):  3 Pre-procedure details:    Preparation:  Patient was prepped and draped in usual sterile fashion and imaging obtained to evaluate for foreign bodies Exploration:    Limited defect created (wound extended): no     Hemostasis achieved with:  Direct pressure   Imaging outcome: foreign body not noted     Wound exploration: wound explored through full range of motion     Wound extent: areolar tissue violated     Contaminated: no   Treatment:    Area cleansed with:  Saline   Amount of  cleaning:  Standard   Visualized foreign bodies/material removed: no     Debridement:  None   Undermining:  None   Scar revision: no   Skin repair:    Repair method:  Staples   Number of staples:  3 Approximation:    Approximation:  Loose Repair type:    Repair type:  Simple Post-procedure details:    Dressing:  Antibiotic ointment and non-adherent dressing   Procedure completion:  Tolerated     Medications Ordered in ED Medications - No data to display  ED Course/ Medical Decision Making/ A&P    Medical Decision Making Amount and/or Complexity of Data Reviewed Radiology: ordered.   3 cm scalp laceration.  Patient's tetanus up-to-date.  CT head and neck without any acute findings.  Will repair the laceration with staples.  Patient will be stable for discharge home.  Patient tolerated the procedure without any difficulties.  See procedure note.  Dressing applied.   Final Clinical Impression(s) / ED Diagnoses Final diagnoses:  Fall, initial encounter  Laceration of scalp, initial encounter  Injury of head, initial encounter    Rx / DC Orders ED Discharge Orders     None         Fredia Sorrow, MD 11/14/22 2317

## 2022-11-14 NOTE — Therapy (Signed)
OUTPATIENT PHYSICAL THERAPY TREATMENT     Patient Name: [name] Pratt MRN: [id number] DOB:1941-07-09, 82 y.o., male Today's Date: 11/14/2022   END OF SESSION:  PT End of Session - 11/14/22 1401     Visit Number 16    Number of Visits 22    Date for PT Re-Evaluation 12/01/22    Authorization Type [name]    Authorization Time Period through 12/01/22    Authorization - Visit Number 6    Authorization - Number of Visits 10    Progress Note Due on Visit 20    PT Start Time 1401    PT Stop Time 1455    PT Time Calculation (min) 54 min    Activity Tolerance Patient tolerated treatment well    Behavior During Therapy [facility] for tasks assessed/performed                      Past Medical History:  Diagnosis Date   Allergic rhinitis 02/02/2016   Anemia 12/17/2009   Formatting of this note might be different from the original. Anemia  10/1 IMO update   Aortic atherosclerosis ([place]) 03/05/2020   Arthritis    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   Atypical chest pain 11/24/2015   Benign essential hypertension 05/13/2009   Qualifier: Diagnosis of  By: Larwance Sachs of this note might be different from the original. Hypertension   Benign paroxysmal positional vertigo 09/14/2021   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[place]) 07/22/2021   Body mass index (BMI) 25.0-25.9, adult 11/02/2020   Cancer ([place])    skin cancer   Cardiac murmur 07/25/2021   Carotid stenosis    a. Carotid U/S [id number]: LICA < [id number], RICA 0000000;  b.  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   Cystoid macular edema of both eyes  07/28/2020   Degenerative disc disease, lumbar 06/30/2015   Deviated nasal septum 02/18/2015   Diverticulosis 03/03/2020   DJD (degenerative joint disease) of hip 12/24/2012   Dyspnea 06/12/2011   Emphysema, unspecified ([place]) 03/05/2020   Erectile dysfunction 06/20/2017   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[place]) 02/09/2021   Gait disorder 03/02/2021   GERD (gastroesophageal reflux disease)    GOUT 05/13/2009   Qualifier: Diagnosis of  By: Burnett Kanaris     Greater trochanteric pain syndrome 07/22/2021   Hand pain, left 01/16/2022   Hearing loss 02/02/2016   Heme positive stool 02/29/2020   HIATAL HERNIA 05/13/2009   Qualifier: Diagnosis of  By: Burnett Kanaris     High risk medication use 01/18/2012   Hip pain 03/02/2021   History of chicken pox    History of COVID-19 12/05/2021   History of hemorrhoids    History of kidney stones    History of skin cancer 07/22/2021   skin cancer   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 Cardiology - Dr Frances Nickels at [facility] cardiology  ICD-10 cut over   Hypertension  under control   Hypokalemia 07/08/2021   Hypothyroidism    Internal hemorrhoids 03/03/2020   Leg cramps 11/13/2019   Lower GI bleed 12/12/2012   Lumbago of lumbar region with sciatica 10/21/2020   Lumbar radiculopathy 03/02/2021   Lumbar spondylosis 11/02/2020   NEPHROLITHIASIS 05/13/2009   Qualifier: Diagnosis of  By: Burnett Kanaris     Nonspecific abnormal electrocardiogram (ECG) (EKG) 01/06/2013   Numbness of hand 01/24/2022   Onychomycosis 06/30/2015   Osteoarthrosis, 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 Osteoarthritis Of The Hand  10/1 IMO update   Osteoarthrosis, unspecified whether generalized  or localized, pelvic region and thigh 07/25/2010   Formatting of this note might be different from the original. Osteoarthritis Of The Hip   Other abnormal glucose 07/22/2021   Other allergic rhinitis 02/18/2015   Palpitations 07/25/2021   Peripheral neuropathy 06/19/2016   Preventative health care 11/24/2015   Right groin pain 11/29/2012   Right inguinal hernia 05/13/2009   Qualifier: Diagnosis of  By: Burnett Kanaris     RLS (restless legs syndrome) 02/18/2015   Rosacea    Rotator cuff tear 07/27/2013   Situational depression 03/02/2021   Skin lesion 01/16/2022   Sleep apnea with use of continuous positive airway pressure (CPAP) 07/15/2013   CPAP set to  7 cm water,  Residual AHi was 7.8  With no leak.  User time 6 hours.  479 days , not used only 12 days - highly compliant 06-23-13  .    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   Urinary retention 11/26/2020   Ventral hernia 07/08/2012   Vertigo 07/08/2021   Weight loss 03/03/2020   Past Surgical History:  Procedure Laterality Date   ABDOMINAL HERNIA REPAIR  07/15/12   Dr [name]   ANTERIOR CERVICAL DECOMP/DISCECTOMY FUSION N/A 07/09/2020   Procedure: ANTERIOR CERVICAL DECOMPRESSION AND FUSION CERVICAL THREE-FOUR.;  Surgeon: [name], MD;  Location: [place];  Service: Neurosurgery;  Laterality: N/A;  anterior   BROW LIFT  05/07/01   COLONOSCOPY WITH PROPOFOL N/A 04/24/2022   Procedure: COLONOSCOPY WITH PROPOFOL;  Surgeon: Thornton Park, MD;  Location: [place];  Service: Gastroenterology;  Laterality: N/A;   COLONOSCOPY WITH PROPOFOL N/A 04/26/2022   Procedure: COLONOSCOPY WITH PROPOFOL;   Surgeon: Thornton Park, MD;  Location: [place];  Service: Gastroenterology;  Laterality: N/A;   CYSTOSCOPY WITH URETEROSCOPY AND STENT PLACEMENT Right 01/28/2014   Procedure: CYSTOSCOPY WITH URETEROSCOPY, BASKET RETRIVAL AND  STENT PLACEMENT;  Surgeon: [name], MD;  Location: [facility];  Service: Urology;  Laterality: Right;   epidural injections     multiple procedures   ESOPHAGEAL DILATION  1993 and 1994   multiple times   EYE SURGERY Bilateral 03/25/10, 2012   cataract removal   HEMORRHOID SURGERY  2014   HIATAL HERNIA REPAIR  12/21/93   HOLMIUM LASER APPLICATION Right [id number]   Procedure: HOLMIUM LASER APPLICATION;  Surgeon: [name], MD;  Location: [facility];  Service: Urology;  Laterality: Right;   INGUINAL HERNIA REPAIR Right 07/15/12   Dr [name], x2   JOINT REPLACEMENT  07/25/04   right knee   JOINT REPLACEMENT  07/13/10   left knee   KNEE SURGERY  1995   LAPAROSCOPIC CHOLECYSTECTOMY  09/20/06   with intraoperative cholangiogram and right inguinal herniorrhaphy with mesh    LIGAMENT REPAIR Left 07/2013   shoulder   LITHOTRIPSY     x2   NASAL SEPTUM SURGERY  1975   POLYPECTOMY  04/26/2022   Procedure: POLYPECTOMY;  Surgeon: Thornton Park, MD;  Location: [place];  Service: Gastroenterology;;   POSTERIOR CERVICAL FUSION/FORAMINOTOMY N/A 11/24/2020   Procedure: Posterior Cervical Laminectomy Cervical three-four, Cervical four-five with lateral mass fusion;  Surgeon: [name], MD;  Location: [place];  Service: Neurosurgery;  Laterality: N/A;   REFRACTIVE SURGERY Bilateral    Right total hip replacement  4/14   SKIN CANCER DESTRUCTION     nose, ear   TONSILLECTOMY  as child   TOTAL HIP ARTHROPLASTY Left    URETHRAL DILATION  1991   [name]   Patient Active Problem List   Diagnosis Date Noted   Weakness of lower extremity 10/10/2022   Skin tear of right elbow without complication [id number]   Skin tear of left lower leg without complication [id number]    Fall 09/18/2022   Iron deficiency anemia 09/18/2022   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   Vitamin D deficiency 08/07/2022   Closed fracture of left hip ([place]) 08/07/2022   Hand weakness 05/02/2022   Acute kidney injury ([place]) 05/02/2022   Adenomatous polyp of ascending colon    Adenomatous polyp of colon    Acute metabolic encephalopathy 0000000   GI bleed 04/21/2022   Vasovagal syncope    Hemorrhagic shock (HCC)    Hematochezia    Irregular heart beat 02/03/2022   Constipation 02/03/2022   Carpal tunnel syndrome 01/24/2022   Numbness of hand 01/24/2022   Ulnar neuropathy 01/24/2022   Skin lesion 01/16/2022   History of COVID-19 12/05/2021   Benign paroxysmal positional vertigo 09/14/2021   Palpitations 07/25/2021   Cardiac murmur 07/25/2021   History of chicken pox 07/22/2021   History of hemorrhoids 07/22/2021   Hypertension 07/22/2021   COPD (chronic obstructive pulmonary disease) ([place]) 07/22/2021   History of kidney stones 07/22/2021   History of skin cancer [id number]   Complication of anesthesia 07/22/2021   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[place]) 07/22/2021   Other abnormal glucose 07/22/2021   Spondylitis ([place]) 07/22/2021   Greater trochanteric pain syndrome 07/22/2021   Vertigo 07/08/2021   Hypokalemia 07/08/2021   Trochanteric bursitis of left hip 06/30/2021   Cancer ([place]) 04/11/2021   Lumbar radiculopathy 03/02/2021   Gait instability 03/02/2021   Hip pain 03/02/2021   Situational depression 03/02/2021   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[place]) 02/09/2021   Urinary retention 11/26/2020   Cervical myelopathy ([place]) 11/24/2020   Body mass index (BMI) 25.0-25.9, adult 11/02/2020   Lumbar spondylosis 11/02/2020   Lumbago of lumbar region with sciatica 10/21/2020   Cystoid macular edema of both eyes 07/28/2020   Status post cervical spinal fusion 07/09/2020   History of total bilateral knee replacement  07/08/2020   Aortic atherosclerosis ([place]) 03/05/2020   Emphysema, unspecified ([place]) 03/05/2020   Arthritis 03/04/2020   Diverticulosis 03/03/2020   Internal hemorrhoids 03/03/2020   Weight loss 03/03/2020   Heme positive stool 02/29/2020   Leg cramps 11/13/2019   Tibialis anterior tendon tear, nontraumatic 11/13/2019   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 07/29/2018   CPAP (continuous positive airway pressure) dependence 03/25/2018   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  Erectile dysfunction 06/20/2017   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 03/06/2017   Peripheral neuropathy 06/19/2016   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   Hearing loss 02/02/2016   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   Allergic rhinitis 02/02/2016   Atypical chest pain 11/24/2015   Preventative health care 11/24/2015   Onychomycosis 06/30/2015   Degenerative disc disease, lumbar 06/30/2015   Other allergic rhinitis 02/18/2015   Deviated nasal septum 02/18/2015   RLS (restless legs syndrome) 02/18/2015   GERD (gastroesophageal reflux disease) 09/18/2014   Hyperglycemia 03/03/2014   Rotator cuff tear 07/27/2013   Sleep apnea with use of continuous positive airway pressure (CPAP) 07/15/2013   Carotid stenosis 02/24/2013   Nonspecific abnormal electrocardiogram (ECG) (EKG) 01/06/2013   DJD (degenerative joint disease) of hip 12/24/2012   Lower GI bleed 12/12/2012   Right groin pain 11/29/2012   OSA (obstructive sleep apnea) 11/25/2012   Ventral hernia 07/08/2012   High risk medication use 01/18/2012   Dyspnea 06/12/2011   Hyperlipidemia with target LDL less than 130 12/27/2010   Osteoarthrosis, unspecified whether generalized or localized, pelvic region and thigh 07/25/2010   Osteoarthrosis, hand 06/13/2010   Anemia 12/17/2009   Hypothyroidism 05/13/2009   GOUT 05/13/2009   Benign essential hypertension 05/13/2009   Right inguinal hernia 05/13/2009    HIATAL HERNIA 05/13/2009   NEPHROLITHIASIS 05/13/2009   ROSACEA 05/13/2009    PCP: [name], NP   REFERRING PROVIDER: [name], NP   REFERRING DIAG: [phone] (ICD-10-CM) - S/P revision of total hip  PT eval and treat for aggressive left knee ROM, weight bearing as tolerated, balance/gait training. S/p revision of left hip replacement at [facility]. (Dr. Linton Rump). No precautions.  THERAPY DIAG:  Stiffness of left knee, not elsewhere classified  Muscle weakness (generalized)  Unsteadiness on feet  Other abnormalities of gait and mobility  Repeated falls  Other low back pain  RATIONALE FOR EVALUATION AND TREATMENT: Rehabilitation  ONSET DATE: 07/02/22  NEXT MD VISIT:  01/22/23    SUBJECTIVE:                                                                                                                                                                                                         SUBJECTIVE STATEMENT:  Pt reports another fall this morning while trying to fold his walker up and put it in his truck. He suffered a new skin tear on his L elbow but no other injuries. Still having difficulty getting his AFO on by himself but feels that he walks better when  wearing it.[name]  PAIN: Are you having pain? No  PERTINENT HISTORY:  Revision of femoral component of L THA on 07/02/22 s/p fall with periprosthetic fracture; Aortic atherosclerosis; carotid stenosis; cervical myelopathy s/p ACDF 06/2020 and posterior cervical fusion 11/2020; peripheral neuropathy; lumbar DDD/spondylosis; lumbago with sciatica/lumbar radiculopathy; OA; B TKA; BTHA; L greater trochanteric pain syndrome; L RCR 2014; recent CTR surgery - early May; hearing loss; HTN; BPPV/vertigo; B sacroiliitis; skin cancer; COPD/emphysema; sleep apnea with CPAP; GERD; gout; HLD; LE cramps; RLS   PRECAUTIONS: Fall  WEIGHT BEARING RESTRICTIONS: No - FWB on left as of 08/18/22  FALLS:  Has patient fallen in last 6  months? Yes. 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 The proximal femur fracture looks healed as I cannot see the fracture line in any view. Good leg lengths.  Right hip replacement is cementless and looks well-fixed with a little varus of the tibial stem.  No bony lesion or arthritic change or other abnormality except some lumbar disc degeneration   07/21/2022 - ORTHO XR PELVIS 2 OR 3 VW UNILATERAL HIP: FINDINGS: AP and lateral views of left hip were obtained and interpreted.  Status post new left total hip revision with wire fixation appearing in good position. Good limb length. No new fracture, bony lesion, or other abnormal findings.  07/21/2022 - ORTHO XR FEMUR 2 VW UNILATERAL: FINDINGS: 3 views of the left femur were obtained and interpreted. Status post new left total hip revision with wire fixation in good position. Previous left total knee arthroplasty. No new fracture, bony lesion, or other abnormal findings.   07/02/2022 - DG HIP (WITH OR WITHOUT PELVIS) 2-3V LEFT :  FINDINGS: Three fluoroscopic spot views of the hip obtained in  the operating room. Cerclage wire fixation of periprosthetic fracture, in improved alignment. Fluoroscopy time 12 seconds. Dose 1.23 mGy. IMPRESSION: Fluoroscopic spot views of the hip during periprosthetic fracture fixation.   07/01/2022 - XR THIGH/FEMUR LEFT 2V & XR PELVIS LIMITED 1 OR 2 VIEWS AP: FINDINGS: Degenerative changes in the lower lumbar spine. Pelvis is intact. SI joints and symphysis pubis are not displaced. Right total hip arthroplasty with non cemented components. Tip of the arthroplasty is not included within the field of view. Visualized components appear well seated. Left total hip arthroplasty. The left hip demonstrates an acute periprosthetic fracture of the proximal femur with lateral displacement and mild distraction of the distal fracture fragment. No evidence of dislocation of the prosthesis. The midshaft and distal left femur are intact. Postoperative left knee arthroplasty. IMPRESSION: 1. Left hip arthroplasty with periprosthetic fracture of the  proximal femur. 2. Incidental note of right hip and left knee arthroplasties.   PATIENT SURVEYS:  LEFS 19 / 80 = 23.8 %  COGNITION: Overall cognitive status: Within functional limits for tasks assessed    SENSATION: Peripheral neuropathy in B feet    EDEMA:  Mild to moderate edema in distal B LE, L>R  MUSCLE LENGTH: (09/07/22) Hamstrings: mod tight R>L ITB: mod tight B Piriformis: mod/severe tight B Hip flexors:  Quads:  Heelcord:   POSTURE:  rounded shoulders, forward head, flexed trunk , and weight shift right  PALPATION: Increased muscle tension throughout left quads and IT band as well as hamstrings  LOWER EXTREMITY ROM:  Active ROM Right eval Left eval Left 2/1 and 2/6 Left 10/26/22  Hip flexion      Hip extension      Hip abduction      Hip adduction      Hip internal rotation      Hip external rotation      Knee flexion 115 92 98 95  Knee extension 0 0    Ankle dorsiflexion  limited    Ankle  plantarflexion      Ankle inversion      Ankle eversion       Passive ROM Left eval Left 10/26/22  Knee flexion 95 95  (Blank rows = not tested)  LOWER EXTREMITY MMT:  MMT Right Eval* Left Eval* Right 2/1 Left 2/1 Right 10/17/22 Left 10/17/22  Hip flexion 3+ 3+ 3+ 3+ 3+ 3+  Hip extension 4- 4- 4- 4-    Hip abduction 4 4- 4 4- 4 4  Hip adduction 4 4 4+ 4+ 4+ 4+  Hip internal rotation        Hip external rotation        Knee flexion '4 4 4 '$ 4- 4- 4-  Knee extension 4+ 4+ 4+ 4+ 4 4  Ankle dorsiflexion 4+ 4      Ankle plantarflexion        Ankle inversion        Ankle eversion         (Blank rows = not tested, * - tested in sitting)  FUNCTIONAL TESTS:  5 times sit to stand: 14.22 sec (09/07/22); 13.81 sec (11/14/22) Timed up and go (TUG): 21.28 sec with SPC; 28.23 sec (10/03/22), 27.17 sec with 4WRW  (11/07/22) 10 meter walk test: 19.94 sec with SPC; Gait speed = 1.63 ft/sec Berg Balance Scale: 38/56; 37-45 significant risk for falls (>80%) (09/07/22); 37/56 (11/14/22) Dynamic Gait Index: 10/24; Scores of 19 or less are predicitve of falls in older community living adults (09/07/22); 14/24 (11/14/22  PLOF (As of 06/20/22): ABC scale: 730 / 1600 = 45.6% Berg: 48/56; 46-51 moderate fall risk (>50%)  10MWT = 11.50 sec with SPC; 10.06 sec w/o AD Gait speed: 2.85 ft/sec with SPC; 3.26 ft/sec w/o AD DGI: 18/24; Scores of 19 or less are predictive of falls in older community living adults  GAIT: Distance walked: 60 ft Assistive device utilized: Single point cane Level of assistance: SBA Gait pattern: step to pattern, step through pattern, decreased step length- Right, decreased stance time- Left, decreased hip/knee flexion- Right, and decreased hip/knee flexion- Left Comments: Inconsistent step length on right creating a barrier to versus step through pattern.  Notable gait instability with SPC.   TODAY'S TREATMENT:   11/14/22 THERAPEUTIC EXERCISE: to improve flexibility, strength and  mobility.  Verbal and tactile cues throughout for technique.  NuStep - L4 x 7  min  THERAPEUTIC ACTIVITIES: Re-instruction in donning L AFO with reminder to monitor for friction/skin breakdown - no issues apparent today but pt not wearing AFO on arrival to PT Review of safe transfers with rollator 5xSTS = 13.81 sec w/o UE assist Berg: 37/56 DGI: 15/24   11/07/22 TUG and initiation of DGI THERAPEUTIC EXERCISE: to improve flexibility, strength and mobility.  Verbal and tactile cues throughout for technique. Standing Marching x10 each Seated LAQ x10 each  Standing HS Curls x10 each Seated hip ABD RTB x 10 bil  Step Ups 6" x 5 each - increased difficulty stepping up with L foot, SBA/CGA via gait belt with min A for correction of LOB x 1    11/02/22  GAIT TRAINING: To normalize gait pattern, improve safety with RW, and assess gait with L posterior lateral strut AFO . Distance walked: 134f Assistive device utilized: WEnvironmental consultant- 2 wheeled Level of assistance: Modified independence Gait pattern: step to pattern, step through pattern, and decreased stride length Comments: increased step length and better toe clearance with L foot usage of AFO and improved gait pattern with faster gait speed.   THERAPEUTIC ACTIVITIES: AFO education - repeated donning/doffing of AFO to help get pt independent. Patient unable to use extended shoe horn due to deficits in hands. He did best with sitting and getting his shoe prepared with AFO inserted, then standing in walker and placing forefoot into shoe, then sitting down and pushing foot into shoe completely. Some difficulty with tongue of shoe being in the way, so on last effort placed a rubberband around shoe and tongue to hold it out of the way until AFO was donned. Then tongue released from rubberband. Patient advised to only do this with UE support and with chair available. Reminded to monitor for friction/skin breakdown  Partial DGI completed with [id number] Last 3  items to be tested next visit. TUG completed 27.17 sec with 4WRW   PATIENT EDUCATION:  Education details: progress with PT, ongoing PT POC, transfer safety, and AFO management and skin monitoring   Person educated: Patient Education method: Explanation, Demonstration, and Verbal cues Education comprehension: verbalized understanding and verbal cues required  HOME EXERCISE PROGRAM: Access Code: [email or web address]: [email or web address] Date: 10/03/2022 Prepared by: JAlmyra Free Exercises - Supine Heel Slide with Strap  - 2 x daily - 7 x weekly - 2 sets - 10 reps - 3 sec hold - Hooklying Single Knee to Chest Stretch  - 2 x daily - 7 x weekly - 3 reps - 30 sec hold - Seated Hamstring Curls with Resistance  - 1 x daily - 7 x weekly - 2 sets - 10 reps - 3 sec hold - Seated Knee Extension with Resistance  - 1 x daily - 7 x weekly - 2 sets - 10 reps - Standing March with Counter Support  - 1 x daily - 7 x weekly - 2 sets - 10 reps - Seated Hamstring Stretch  - 2 x daily - 7 x weekly - 3 reps - 30 sec hold - Standing ITB Stretch  - 2 x daily - 7 x weekly - 3 reps - 30 sec hold - Standing Hip Flexor Stretch  - 2 x daily - 7 x weekly - 2 sets - 10 reps - 30 sec hold - Seated Hip Internal Rotation AROM (Mirrored)  - 1 x daily - 7 x weekly - 3 sets - 10 reps  ASSESSMENT:  CLINICAL IMPRESSION:  FKeenen Pratt reports another this morning  while attempting to fold his walker and place it in  his truck.  He denies injury other than new skin tear to the left elbow.  He continues to have difficulty donning his AFO on his own and hence does not wear it routinely.  Reviewed training and donning AFO with AFO worn for rest of therapy session today.  Patient noting improved walking ability with better foot clearance when wearing L AFO.  Given recurrent falls, balance testing completed today with [name] Pratt demonstrating a slight regression on the Berg by 1 point to 37/56, but a 4 point improvement on FGA to  14/24. Both still indicate a high risk for falls and emphasize need for continued use of rolling walker at all times, with patient verbalizing understanding. [name] Pratt will continue to benefit from skilled PT to address ongoing strength and balance deficits to improve mobility and activity tolerance with decreased pain interference and decreased risk for falls.   OBJECTIVE IMPAIRMENTS: Abnormal gait, decreased activity tolerance, decreased balance, decreased coordination, decreased endurance, decreased knowledge of condition, decreased knowledge of use of DME, decreased mobility, difficulty walking, decreased ROM, decreased strength, decreased safety awareness, increased fascial restrictions, impaired perceived functional ability, impaired flexibility, impaired sensation, improper body mechanics, postural dysfunction, and pain.   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   REHAB POTENTIAL: Good  CLINICAL DECISION MAKING: Evolving/moderate complexity  EVALUATION COMPLEXITY: Moderate   GOALS: Goals reviewed with patient? Yes  SHORT TERM GOALS:  Target date: 10/03/2022   Patient will be independent with initial HEP. Baseline:  Goal status: MET  2.  Complete balance testing and establish additional LTGs as appropriate. Baseline:  Goal status: MET 09/07/22  3.  Patient will demonstrate decreased TUG time to </= 16 sec to decrease risk for falls with transitional mobility Baseline: 21.28 sec, 10/03/22 - 28.23 sec, 11/07/21 27.17 sec Goal status: IN PROGRESS  LONG TERM GOALS: Target date: 10/31/2022, extended to 12/01/22   Patient will be independent with advanced/ongoing HEP to improve outcomes and carryover.  Baseline:  Goal status: IN PROGRESS  2.  Patient will report at least 50-75% improvement in low back and Lhip pain to improve QOL. Baseline: 7/10 Goal status: IN PROGRESS  3.  Patient will demonstrate improved L knee AROM to [facility] to allow for normal gait and stair mechanics. Baseline: AROM 0-92, PROM 0-95 Goal status: IN PROGRESS  10/17/22- L knee AROM 0-98  4.  Patient will demonstrate improved B LE strength to >/= 4 to 4+/5 for improved stability and ease of mobility. Baseline:  Goal status: IN PROGRESS  10/17/22 - refer to above MMT table  5.  Patient will be able to ambulate 600' with LRAD and normal gait pattern, good stability and without increased pain to safely access community.  Baseline:  Goal status: IN PROGRESS  6. Patient will be able to ascend/descend stairs  with 1 HR and reciprocal step pattern safely to access home and community.  Baseline:  Goal status: IN PROGRESS  10/17/22 - able to complete stairs with 1HR however resulting to step to pattern naturally  7.  Patient will report >/=28/80 on LEFS to demonstrate improved functional ability. Baseline: 19 / 80 = 23.8 % Goal status: IN PROGRESS  8.  Patient will demonstrate at least 19/24 on DGI to decrease risk of falls. Baseline: 10/24 (09/07/22) Goal status: IN PROGRESS  11/14/22 - 14/24  9.  Patient will improve Berg score by at least 8 points to  improve safety and stability with ADLs in standing and reduce risk for falls Baseline: 38/56 (09/07/22) Goal status: IN PROGRESS  11/14/22 - 37/56   PLAN:  PT FREQUENCY: 2x/week  PT DURATION: 6 weeks  PLANNED INTERVENTIONS: Therapeutic exercises, Therapeutic activity, Neuromuscular re-education, Balance training, Gait training, Patient/Family education, Self Care, Joint mobilization, Stair training, DME instructions, Dry Needling, Electrical stimulation, Cryotherapy, Moist heat, scar mobilization, Taping, Ultrasound, Ionotophoresis '4mg'$ /ml Dexamethasone, Manual therapy, and Re-evaluation  PLAN FOR NEXT SESSION: F/u with AFO as needed; continue with LE flexibility and ROM exercises, emphasizing L knee flexion; work on balance and strengthening exercises as able; update HEP accordingly.    Percival Spanish, PT 11/14/2022, 4:55 PM

## 2022-11-14 NOTE — ED Triage Notes (Addendum)
Pt states he stood up and his legs were weak so he fell backwards. Pt has lac to back LT head. Pt hit a bookshelf, did not lose consciousness. Pt reports weakness in legs all week and this is a chronic issue due to sciatica; uses walker. No other complaints. Weakness is not new.

## 2022-11-14 NOTE — ED Notes (Signed)
Pt states he takes a baby aspirin daily. Denies pain at present; currently in bed in NAD.

## 2022-11-16 ENCOUNTER — Ambulatory Visit: Payer: Medicare HMO

## 2022-11-16 DIAGNOSIS — R2681 Unsteadiness on feet: Secondary | ICD-10-CM

## 2022-11-16 DIAGNOSIS — R296 Repeated falls: Secondary | ICD-10-CM | POA: Diagnosis not present

## 2022-11-16 DIAGNOSIS — M5459 Other low back pain: Secondary | ICD-10-CM | POA: Diagnosis not present

## 2022-11-16 DIAGNOSIS — R2689 Other abnormalities of gait and mobility: Secondary | ICD-10-CM

## 2022-11-16 DIAGNOSIS — M25662 Stiffness of left knee, not elsewhere classified: Secondary | ICD-10-CM

## 2022-11-16 DIAGNOSIS — M6281 Muscle weakness (generalized): Secondary | ICD-10-CM | POA: Diagnosis not present

## 2022-11-16 NOTE — Therapy (Signed)
OUTPATIENT PHYSICAL THERAPY TREATMENT     Patient Name: [name] Pratt MRN: [id number] DOB:Apr 16, 1941, 82 y.o., male Today's Date: 11/16/2022   END OF SESSION:  PT End of Session - 11/16/22 1341     Visit Number 17    Number of Visits 22    Date for PT Re-Evaluation 12/01/22    Authorization Type [name]    Authorization Time Period through 12/01/22    Authorization - Visit Number 7    Authorization - Number of Visits 10    Progress Note Due on Visit 20    PT Start Time 1315    PT Stop Time 1400    PT Time Calculation (min) 45 min    Activity Tolerance Patient tolerated treatment well    Behavior During Therapy [facility] for tasks assessed/performed                       Past Medical History:  Diagnosis Date   Allergic rhinitis 02/02/2016   Anemia 12/17/2009   Formatting of this note might be different from the original. Anemia  10/1 IMO update   Aortic atherosclerosis ([place]) 03/05/2020   Arthritis    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   Atypical chest pain 11/24/2015   Benign essential hypertension 05/13/2009   Qualifier: Diagnosis of  By: Larwance Sachs of this note might be different from the original. Hypertension   Benign paroxysmal positional vertigo 09/14/2021   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[place]) 07/22/2021   Body mass index (BMI) 25.0-25.9, adult 11/02/2020   Cancer ([place])    skin cancer   Cardiac murmur 07/25/2021   Carotid stenosis    a. Carotid U/S [id number]: LICA < [id number], RICA 0000000;  b.  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   Cystoid macular edema of both eyes  07/28/2020   Degenerative disc disease, lumbar 06/30/2015   Deviated nasal septum 02/18/2015   Diverticulosis 03/03/2020   DJD (degenerative joint disease) of hip 12/24/2012   Dyspnea 06/12/2011   Emphysema, unspecified ([place]) 03/05/2020   Erectile dysfunction 06/20/2017   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[place]) 02/09/2021   Gait disorder 03/02/2021   GERD (gastroesophageal reflux disease)    GOUT 05/13/2009   Qualifier: Diagnosis of  By: Burnett Kanaris     Greater trochanteric pain syndrome 07/22/2021   Hand pain, left 01/16/2022   Hearing loss 02/02/2016   Heme positive stool 02/29/2020   HIATAL HERNIA 05/13/2009   Qualifier: Diagnosis of  By: Burnett Kanaris     High risk medication use 01/18/2012   Hip pain 03/02/2021   History of chicken pox    History of COVID-19 12/05/2021   History of hemorrhoids    History of kidney stones    History of skin cancer 07/22/2021   skin cancer   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 Cardiology - Dr Frances Nickels at [facility] cardiology  ICD-10 cut over  Hypertension    under control   Hypokalemia 07/08/2021   Hypothyroidism    Internal hemorrhoids 03/03/2020   Leg cramps 11/13/2019   Lower GI bleed 12/12/2012   Lumbago of lumbar region with sciatica 10/21/2020   Lumbar radiculopathy 03/02/2021   Lumbar spondylosis 11/02/2020   NEPHROLITHIASIS 05/13/2009   Qualifier: Diagnosis of  By: Burnett Kanaris     Nonspecific abnormal electrocardiogram (ECG) (EKG) 01/06/2013   Numbness of hand 01/24/2022   Onychomycosis 06/30/2015   Osteoarthrosis, 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 Osteoarthritis Of The Hand  10/1 IMO update   Osteoarthrosis, unspecified whether generalized  or localized, pelvic region and thigh 07/25/2010   Formatting of this note might be different from the original. Osteoarthritis Of The Hip   Other abnormal glucose 07/22/2021   Other allergic rhinitis 02/18/2015   Palpitations 07/25/2021   Peripheral neuropathy 06/19/2016   Preventative health care 11/24/2015   Right groin pain 11/29/2012   Right inguinal hernia 05/13/2009   Qualifier: Diagnosis of  By: Burnett Kanaris     RLS (restless legs syndrome) 02/18/2015   Rosacea    Rotator cuff tear 07/27/2013   Situational depression 03/02/2021   Skin lesion 01/16/2022   Sleep apnea with use of continuous positive airway pressure (CPAP) 07/15/2013   CPAP set to  7 cm water,  Residual AHi was 7.8  With no leak.  User time 6 hours.  479 days , not used only 12 days - highly compliant 06-23-13  .    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   Urinary retention 11/26/2020   Ventral hernia 07/08/2012   Vertigo 07/08/2021   Weight loss 03/03/2020   Past Surgical History:  Procedure Laterality Date   ABDOMINAL HERNIA REPAIR  07/15/12   Dr [name]   ANTERIOR CERVICAL DECOMP/DISCECTOMY FUSION N/A 07/09/2020   Procedure: ANTERIOR CERVICAL DECOMPRESSION AND FUSION CERVICAL THREE-FOUR.;  Surgeon: [name], MD;  Location: [place];  Service: Neurosurgery;  Laterality: N/A;  anterior   BROW LIFT  05/07/01   COLONOSCOPY WITH PROPOFOL N/A 04/24/2022   Procedure: COLONOSCOPY WITH PROPOFOL;  Surgeon: Thornton Park, MD;  Location: [place];  Service: Gastroenterology;  Laterality: N/A;   COLONOSCOPY WITH PROPOFOL N/A 04/26/2022   Procedure: COLONOSCOPY WITH PROPOFOL;   Surgeon: Thornton Park, MD;  Location: [place];  Service: Gastroenterology;  Laterality: N/A;   CYSTOSCOPY WITH URETEROSCOPY AND STENT PLACEMENT Right 01/28/2014   Procedure: CYSTOSCOPY WITH URETEROSCOPY, BASKET RETRIVAL AND  STENT PLACEMENT;  Surgeon: [name], MD;  Location: [facility];  Service: Urology;  Laterality: Right;   epidural injections     multiple procedures   ESOPHAGEAL DILATION  1993 and 1994   multiple times   EYE SURGERY Bilateral 03/25/10, 2012   cataract removal   HEMORRHOID SURGERY  2014   HIATAL HERNIA REPAIR  12/21/93   HOLMIUM LASER APPLICATION Right [id number]   Procedure: HOLMIUM LASER APPLICATION;  Surgeon: [name], MD;  Location: [facility];  Service: Urology;  Laterality: Right;   INGUINAL HERNIA REPAIR Right 07/15/12   Dr [name], x2   JOINT REPLACEMENT  07/25/04   right knee  JOINT REPLACEMENT  07/13/10   left knee   KNEE SURGERY  1995   LAPAROSCOPIC CHOLECYSTECTOMY  09/20/06   with intraoperative cholangiogram and right inguinal herniorrhaphy with mesh    LIGAMENT REPAIR Left 07/2013   shoulder   LITHOTRIPSY     x2   NASAL SEPTUM SURGERY  1975   POLYPECTOMY  04/26/2022   Procedure: POLYPECTOMY;  Surgeon: Thornton Park, MD;  Location: [place];  Service: Gastroenterology;;   POSTERIOR CERVICAL FUSION/FORAMINOTOMY N/A 11/24/2020   Procedure: Posterior Cervical Laminectomy Cervical three-four, Cervical four-five with lateral mass fusion;  Surgeon: [name], MD;  Location: [place];  Service: Neurosurgery;  Laterality: N/A;   REFRACTIVE SURGERY Bilateral    Right total hip replacement  4/14   SKIN CANCER DESTRUCTION     nose, ear   TONSILLECTOMY  as child   TOTAL HIP ARTHROPLASTY Left    URETHRAL DILATION  1991   [name]   Patient Active Problem List   Diagnosis Date Noted   Weakness of lower extremity 10/10/2022   Skin tear of right elbow without complication [id number]   Skin tear of left lower leg without complication [id number]    Fall 09/18/2022   Iron deficiency anemia 09/18/2022   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   Vitamin D deficiency 08/07/2022   Closed fracture of left hip ([place]) 08/07/2022   Hand weakness 05/02/2022   Acute kidney injury ([place]) 05/02/2022   Adenomatous polyp of ascending colon    Adenomatous polyp of colon    Acute metabolic encephalopathy 0000000   GI bleed 04/21/2022   Vasovagal syncope    Hemorrhagic shock (HCC)    Hematochezia    Irregular heart beat 02/03/2022   Constipation 02/03/2022   Carpal tunnel syndrome 01/24/2022   Numbness of hand 01/24/2022   Ulnar neuropathy 01/24/2022   Skin lesion 01/16/2022   History of COVID-19 12/05/2021   Benign paroxysmal positional vertigo 09/14/2021   Palpitations 07/25/2021   Cardiac murmur 07/25/2021   History of chicken pox 07/22/2021   History of hemorrhoids 07/22/2021   Hypertension 07/22/2021   COPD (chronic obstructive pulmonary disease) ([place]) 07/22/2021   History of kidney stones 07/22/2021   History of skin cancer [id number]   Complication of anesthesia 07/22/2021   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[place]) 07/22/2021   Other abnormal glucose 07/22/2021   Spondylitis ([place]) 07/22/2021   Greater trochanteric pain syndrome 07/22/2021   Vertigo 07/08/2021   Hypokalemia 07/08/2021   Trochanteric bursitis of left hip 06/30/2021   Cancer ([place]) 04/11/2021   Lumbar radiculopathy 03/02/2021   Gait instability 03/02/2021   Hip pain 03/02/2021   Situational depression 03/02/2021   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[place]) 02/09/2021   Urinary retention 11/26/2020   Cervical myelopathy ([place]) 11/24/2020   Body mass index (BMI) 25.0-25.9, adult 11/02/2020   Lumbar spondylosis 11/02/2020   Lumbago of lumbar region with sciatica 10/21/2020   Cystoid macular edema of both eyes 07/28/2020   Status post cervical spinal fusion 07/09/2020   History of total bilateral knee replacement  07/08/2020   Aortic atherosclerosis ([place]) 03/05/2020   Emphysema, unspecified ([place]) 03/05/2020   Arthritis 03/04/2020   Diverticulosis 03/03/2020   Internal hemorrhoids 03/03/2020   Weight loss 03/03/2020   Heme positive stool 02/29/2020   Leg cramps 11/13/2019   Tibialis anterior tendon tear, nontraumatic 11/13/2019   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 07/29/2018   CPAP (continuous positive airway pressure) dependence 03/25/2018   Complex sleep  apnea syndrome 03/25/2018   Erectile dysfunction 06/20/2017   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 03/06/2017   Peripheral neuropathy 06/19/2016   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   Hearing loss 02/02/2016   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   Allergic rhinitis 02/02/2016   Atypical chest pain 11/24/2015   Preventative health care 11/24/2015   Onychomycosis 06/30/2015   Degenerative disc disease, lumbar 06/30/2015   Other allergic rhinitis 02/18/2015   Deviated nasal septum 02/18/2015   RLS (restless legs syndrome) 02/18/2015   GERD (gastroesophageal reflux disease) 09/18/2014   Hyperglycemia 03/03/2014   Rotator cuff tear 07/27/2013   Sleep apnea with use of continuous positive airway pressure (CPAP) 07/15/2013   Carotid stenosis 02/24/2013   Nonspecific abnormal electrocardiogram (ECG) (EKG) 01/06/2013   DJD (degenerative joint disease) of hip 12/24/2012   Lower GI bleed 12/12/2012   Right groin pain 11/29/2012   OSA (obstructive sleep apnea) 11/25/2012   Ventral hernia 07/08/2012   High risk medication use 01/18/2012   Dyspnea 06/12/2011   Hyperlipidemia with target LDL less than 130 12/27/2010   Osteoarthrosis, unspecified whether generalized or localized, pelvic region and thigh 07/25/2010   Osteoarthrosis, hand 06/13/2010   Anemia 12/17/2009   Hypothyroidism 05/13/2009   GOUT 05/13/2009   Benign essential hypertension 05/13/2009   Right inguinal hernia 05/13/2009    HIATAL HERNIA 05/13/2009   NEPHROLITHIASIS 05/13/2009   ROSACEA 05/13/2009    PCP: [name], NP   REFERRING PROVIDER: [name], NP   REFERRING DIAG: [phone] (ICD-10-CM) - S/P revision of total hip  PT eval and treat for aggressive left knee ROM, weight bearing as tolerated, balance/gait training. S/p revision of left hip replacement at [facility]. (Dr. Linton Rump). No precautions.  THERAPY DIAG:  Stiffness of left knee, not elsewhere classified  Muscle weakness (generalized)  Unsteadiness on feet  Other abnormalities of gait and mobility  Repeated falls  Other low back pain  RATIONALE FOR EVALUATION AND TREATMENT: Rehabilitation  ONSET DATE: 07/02/22  NEXT MD VISIT:  01/22/23    SUBJECTIVE:                                                                                                                                                                                                         SUBJECTIVE STATEMENT:  Pt reports another fall, bent over to pick a something up as he came up he kept feeling backwards, hit his head on bookshelf, went to ED and reviewed staples on head. Denies any worsening sx since then.  PAIN: Are you having pain?  No  PERTINENT HISTORY:  Revision of femoral component of L THA on 07/02/22 s/p fall with periprosthetic fracture; Aortic atherosclerosis; carotid stenosis; cervical myelopathy s/p ACDF 06/2020 and posterior cervical fusion 11/2020; peripheral neuropathy; lumbar DDD/spondylosis; lumbago with sciatica/lumbar radiculopathy; OA; B TKA; BTHA; L greater trochanteric pain syndrome; L RCR 2014; recent CTR surgery - early May; hearing loss; HTN; BPPV/vertigo; B sacroiliitis; skin cancer; COPD/emphysema; sleep apnea with CPAP; GERD; gout; HLD; LE cramps; RLS   PRECAUTIONS: Fall  WEIGHT BEARING RESTRICTIONS: No - FWB on left as of 08/18/22  FALLS:  Has patient fallen in last 6 months? Yes. 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 The proximal femur fracture looks healed as I cannot see the fracture line in any view. Good leg lengths.  Right hip replacement is cementless and looks well-fixed with a little varus of the tibial stem.  No bony lesion or arthritic change or other abnormality except some lumbar disc degeneration   07/21/2022 - ORTHO XR PELVIS 2 OR 3 VW UNILATERAL HIP: FINDINGS: AP and lateral views of left hip were obtained and interpreted.  Status post new left total hip revision with wire fixation appearing in good position. Good limb length. No new fracture, bony lesion, or other abnormal findings.  07/21/2022 - ORTHO XR FEMUR 2 VW UNILATERAL: FINDINGS: 3 views of the left femur were obtained and interpreted. Status post new left total hip revision with wire fixation in good position. Previous left total knee arthroplasty. No new fracture, bony lesion, or other abnormal findings.   07/02/2022 - DG HIP (WITH OR WITHOUT PELVIS) 2-3V LEFT :  FINDINGS: Three fluoroscopic spot views of the hip obtained in the operating room. Cerclage wire fixation of periprosthetic  fracture, in improved alignment. Fluoroscopy time 12 seconds. Dose 1.23 mGy. IMPRESSION: Fluoroscopic spot views of the hip during periprosthetic fracture fixation.   07/01/2022 - XR THIGH/FEMUR LEFT 2V & XR PELVIS LIMITED 1 OR 2 VIEWS AP: FINDINGS: Degenerative changes in the lower lumbar spine. Pelvis is intact. SI joints and symphysis pubis are not displaced. Right total hip arthroplasty with non cemented components. Tip of the arthroplasty is not included within the field of view. Visualized components appear well seated. Left total hip arthroplasty. The left hip demonstrates an acute periprosthetic fracture of the proximal femur with lateral displacement and mild distraction of the distal fracture fragment. No evidence of dislocation of the prosthesis. The midshaft and distal left femur are intact. Postoperative left knee arthroplasty. IMPRESSION: 1. Left hip arthroplasty with periprosthetic fracture of the proximal femur. 2. Incidental note of right hip  and left knee arthroplasties.   PATIENT SURVEYS:  LEFS 19 / 80 = 23.8 %  COGNITION: Overall cognitive status: Within functional limits for tasks assessed    SENSATION: Peripheral neuropathy in B feet    EDEMA:  Mild to moderate edema in distal B LE, L>R  MUSCLE LENGTH: (09/07/22) Hamstrings: mod tight R>L ITB: mod tight B Piriformis: mod/severe tight B Hip flexors:  Quads:  Heelcord:   POSTURE:  rounded shoulders, forward head, flexed trunk , and weight shift right  PALPATION: Increased muscle tension throughout left quads and IT band as well as hamstrings  LOWER EXTREMITY ROM:  Active ROM Right eval Left eval Left 2/1 and 2/6 Left 10/26/22  Hip flexion      Hip extension      Hip abduction      Hip adduction      Hip internal rotation      Hip external rotation      Knee flexion 115 92 98 95  Knee extension 0 0    Ankle dorsiflexion  limited    Ankle plantarflexion      Ankle inversion      Ankle eversion        Passive ROM Left eval Left 10/26/22  Knee flexion 95 95  (Blank rows = not tested)  LOWER EXTREMITY MMT:  MMT Right Eval* Left Eval* Right 2/1 Left 2/1 Right 10/17/22 Left 10/17/22  Hip flexion 3+ 3+ 3+ 3+ 3+ 3+  Hip extension 4- 4- 4- 4-    Hip abduction 4 4- 4 4- 4 4  Hip adduction 4 4 4+ 4+ 4+ 4+  Hip internal rotation        Hip external rotation        Knee flexion '4 4 4 '$ 4- 4- 4-  Knee extension 4+ 4+ 4+ 4+ 4 4  Ankle dorsiflexion 4+ 4      Ankle plantarflexion        Ankle inversion        Ankle eversion         (Blank rows = not tested, * - tested in sitting)  FUNCTIONAL TESTS:  5 times sit to stand: 14.22 sec (09/07/22); 13.81 sec (11/14/22) Timed up and go (TUG): 21.28 sec with SPC; 28.23 sec (10/03/22), 27.17 sec with 4WRW  (11/07/22) 10 meter walk test: 19.94 sec with SPC; Gait speed = 1.63 ft/sec Berg Balance Scale: 38/56; 37-45 significant risk for falls (>80%) (09/07/22); 37/56 (11/14/22) Dynamic Gait Index: 10/24; Scores of 19 or less are predicitve of falls in older community living adults (09/07/22); 14/24 (11/14/22  PLOF (As of 06/20/22): ABC scale: 730 / 1600 = 45.6% Berg: 48/56; 46-51 moderate fall risk (>50%)  10MWT = 11.50 sec with SPC; 10.06 sec w/o AD Gait speed: 2.85 ft/sec with SPC; 3.26 ft/sec w/o AD DGI: 18/24; Scores of 19 or less are predictive of falls in older community living adults  GAIT: Distance walked: 60 ft Assistive device utilized: Single point cane Level of assistance: SBA Gait pattern: step to pattern, step through pattern, decreased step length- Right, decreased stance time- Left, decreased hip/knee flexion- Right, and decreased hip/knee flexion- Left Comments: Inconsistent step length on right creating a barrier to versus step through pattern.  Notable gait instability with SPC.   TODAY'S TREATMENT:  11/16/22 Therapeutic Activity: to improve functional performance. Donned AFO for patient   THERAPEUTIC EXERCISE: to improve  flexibility, strength and mobility.  Verbal and tactile cues throughout for technique.  NuStep -  L5 x 6 min Standing church pew x 12  Standing toe raise back against counter x 10- R knee buckled x 1 Seated ball squeeze hip ADD 10x Seated clams w/ RTB 2x10 Standing march x 10 bil Standing sidestep and back (about 1.5 ft out to side) x 10 bil Standing hip abduction x 10 bil Standing hip extension x 10 bil  11/14/22 THERAPEUTIC EXERCISE: to improve flexibility, strength and mobility.  Verbal and tactile cues throughout for technique.  NuStep - L4 x 7 min  THERAPEUTIC ACTIVITIES: Re-instruction in donning L AFO with reminder to monitor for friction/skin breakdown - no issues apparent today but pt not wearing AFO on arrival to PT Review of safe transfers with rollator 5xSTS = 13.81 sec w/o UE assist Berg: 37/56 DGI: 15/24   11/07/22 TUG and initiation of DGI THERAPEUTIC EXERCISE: to improve flexibility, strength and mobility.  Verbal and tactile cues throughout for technique. Standing Marching x10 each Seated LAQ x10 each  Standing HS Curls x10 each Seated hip ABD RTB x 10 bil  Step Ups 6" x 5 each - increased difficulty stepping up with L foot, SBA/CGA via gait belt with min A for correction of LOB x 1    11/02/22  GAIT TRAINING: To normalize gait pattern, improve safety with RW, and assess gait with L posterior lateral strut AFO . Distance walked: 128f Assistive device utilized: WEnvironmental consultant- 2 wheeled Level of assistance: Modified independence Gait pattern: step to pattern, step through pattern, and decreased stride length Comments: increased step length and better toe clearance with L foot usage of AFO and improved gait pattern with faster gait speed.   THERAPEUTIC ACTIVITIES: AFO education - repeated donning/doffing of AFO to help get pt independent. Patient unable to use extended shoe horn due to deficits in hands. He did best with sitting and getting his shoe prepared with AFO  inserted, then standing in walker and placing forefoot into shoe, then sitting down and pushing foot into shoe completely. Some difficulty with tongue of shoe being in the way, so on last effort placed a rubberband around shoe and tongue to hold it out of the way until AFO was donned. Then tongue released from rubberband. Patient advised to only do this with UE support and with chair available. Reminded to monitor for friction/skin breakdown  Partial DGI completed with [id number] Last 3 items to be tested next visit. TUG completed 27.17 sec with 4WRW   PATIENT EDUCATION:  Education details: progress with PT, ongoing PT POC, transfer safety, and AFO management and skin monitoring   Person educated: Patient Education method: Explanation, Demonstration, and Verbal cues Education comprehension: verbalized understanding and verbal cues required  HOME EXERCISE PROGRAM: Access Code: [email or web address]: [email or web address] Date: 10/03/2022 Prepared by: JAlmyra Free Exercises - Supine Heel Slide with Strap  - 2 x daily - 7 x weekly - 2 sets - 10 reps - 3 sec hold - Hooklying Single Knee to Chest Stretch  - 2 x daily - 7 x weekly - 3 reps - 30 sec hold - Seated Hamstring Curls with Resistance  - 1 x daily - 7 x weekly - 2 sets - 10 reps - 3 sec hold - Seated Knee Extension with Resistance  - 1 x daily - 7 x weekly - 2 sets - 10 reps - Standing March with Counter Support  - 1 x daily - 7 x weekly - 2 sets - 10 reps - Seated Hamstring Stretch  - 2 x daily -  7 x weekly - 3 reps - 30 sec hold - Standing ITB Stretch  - 2 x daily - 7 x weekly - 3 reps - 30 sec hold - Standing Hip Flexor Stretch  - 2 x daily - 7 x weekly - 2 sets - 10 reps - 30 sec hold - Seated Hip Internal Rotation AROM (Mirrored)  - 1 x daily - 7 x weekly - 3 sets - 10 reps  ASSESSMENT:  CLINICAL IMPRESSION:  Pt continues to show weakness in LE evident by occasional knee buckling with standing exercises. Cues for posture with seated  and standing exercises to improve alignment and core engagement. He continues to have difficulty with L ankle DF during gait and also shows some R knee genu recurvatum at midstance. Pt would continue to benefit from skilled therapy to address deficits.   OBJECTIVE IMPAIRMENTS: Abnormal gait, decreased activity tolerance, decreased balance, decreased coordination, decreased endurance, decreased knowledge of condition, decreased knowledge of use of DME, decreased mobility, difficulty walking, decreased ROM, decreased strength, decreased safety awareness, increased fascial restrictions, impaired perceived functional ability, impaired flexibility, impaired sensation, improper body mechanics, postural dysfunction, and pain.   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   REHAB POTENTIAL: Good  CLINICAL DECISION MAKING: Evolving/moderate complexity  EVALUATION COMPLEXITY: Moderate   GOALS: Goals reviewed with patient? Yes  SHORT TERM GOALS: Target date: 10/03/2022   Patient will be independent with initial HEP. Baseline:  Goal status: MET  2.   Complete balance testing and establish additional LTGs as appropriate. Baseline:  Goal status: MET 09/07/22  3.  Patient will demonstrate decreased TUG time to </= 16 sec to decrease risk for falls with transitional mobility Baseline: 21.28 sec, 10/03/22 - 28.23 sec, 11/07/21 27.17 sec Goal status: IN PROGRESS  LONG TERM GOALS: Target date: 10/31/2022, extended to 12/01/22   Patient will be independent with advanced/ongoing HEP to improve outcomes and carryover.  Baseline:  Goal status: IN PROGRESS  2.  Patient will report at least 50-75% improvement in low back and Lhip pain to improve QOL. Baseline: 7/10 Goal status: IN PROGRESS  3.  Patient will demonstrate improved L knee AROM to [facility] to allow for normal gait and stair mechanics. Baseline: AROM 0-92, PROM 0-95 Goal status: IN PROGRESS  10/17/22- L knee AROM 0-98  4.  Patient will demonstrate improved B LE strength to >/= 4 to 4+/5 for improved stability and ease of mobility. Baseline:  Goal status: IN PROGRESS  10/17/22 - refer to above MMT table  5.  Patient will be able to ambulate 600' with LRAD and normal gait pattern, good stability and without increased pain to safely access community.  Baseline:  Goal status: IN PROGRESS  6. Patient will be able to ascend/descend stairs with 1 HR and reciprocal step pattern safely to access home and community.  Baseline:  Goal status: IN PROGRESS  10/17/22 - able to complete stairs with 1HR however resulting to step to pattern naturally  7.  Patient will report >/=28/80 on LEFS to demonstrate improved functional ability. Baseline: 19 / 80 = 23.8 % Goal status: IN PROGRESS  8.  Patient will demonstrate at least 19/24 on DGI to decrease risk of falls. Baseline: 10/24 (09/07/22) Goal status: IN PROGRESS  11/14/22 - 14/24  9.  Patient will improve Berg score by at least 8 points to improve safety and stability with ADLs in standing and reduce risk for falls Baseline: 38/56 (09/07/22) Goal  status: IN PROGRESS  11/14/22 - 37/56   PLAN:  PT FREQUENCY: 2x/week  PT DURATION: 6 weeks  PLANNED INTERVENTIONS: Therapeutic exercises, Therapeutic activity, Neuromuscular re-education, Balance training, Gait training, Patient/Family education, Self Care, Joint mobilization, Stair training, DME instructions, Dry Needling, Electrical stimulation, Cryotherapy, Moist heat, scar mobilization, Taping, Ultrasound, Ionotophoresis '4mg'$ /ml Dexamethasone, Manual therapy, and Re-evaluation  PLAN FOR NEXT SESSION: F/u with AFO as needed; continue with LE flexibility and ROM exercises, emphasizing L knee flexion; work on balance and strengthening exercises as able; update HEP accordingly.    Artist Pais, PTA 11/16/2022, 3:35 PM

## 2022-11-18 ENCOUNTER — Ambulatory Visit (HOSPITAL_BASED_OUTPATIENT_CLINIC_OR_DEPARTMENT_OTHER)
Admission: RE | Admit: 2022-11-18 | Discharge: 2022-11-18 | Disposition: A | Discharge status: Home / Self Care | Payer: Medicare HMO | Source: Ambulatory Visit | Attending: Neurosurgery | Admitting: Neurosurgery

## 2022-11-18 DIAGNOSIS — M5440 Lumbago with sciatica, unspecified side: Secondary | ICD-10-CM | POA: Insufficient documentation

## 2022-11-18 DIAGNOSIS — M48061 Spinal stenosis, lumbar region without neurogenic claudication: Secondary | ICD-10-CM | POA: Diagnosis not present

## 2022-11-18 DIAGNOSIS — M5126 Other intervertebral disc displacement, lumbar region: Secondary | ICD-10-CM | POA: Diagnosis not present

## 2022-11-21 ENCOUNTER — Encounter: Payer: Self-pay | Admitting: Physical Therapy

## 2022-11-21 ENCOUNTER — Ambulatory Visit: Payer: Medicare HMO | Admitting: Physical Therapy

## 2022-11-21 DIAGNOSIS — R2681 Unsteadiness on feet: Secondary | ICD-10-CM

## 2022-11-21 DIAGNOSIS — M25662 Stiffness of left knee, not elsewhere classified: Secondary | ICD-10-CM

## 2022-11-21 DIAGNOSIS — M5459 Other low back pain: Secondary | ICD-10-CM | POA: Diagnosis not present

## 2022-11-21 DIAGNOSIS — R296 Repeated falls: Secondary | ICD-10-CM

## 2022-11-21 DIAGNOSIS — M6281 Muscle weakness (generalized): Secondary | ICD-10-CM

## 2022-11-21 DIAGNOSIS — R2689 Other abnormalities of gait and mobility: Secondary | ICD-10-CM

## 2022-11-21 NOTE — Therapy (Signed)
OUTPATIENT PHYSICAL THERAPY TREATMENT     Patient Name: [name] Pratt MRN: [id number] DOB:[birth year], 82 y.o., male Today's Date: 11/21/2022   END OF SESSION:  PT End of Session - 11/21/22 1058     Visit Number 18    Number of Visits 22    Date for PT Re-Evaluation 12/01/22    Authorization Type [name]    Authorization Time Period through 12/01/22    Authorization - Visit Number 8    Authorization - Number of Visits 10    Progress Note Due on Visit 20    PT Start Time 1058    PT Stop Time 1155    PT Time Calculation (min) 57 min    Activity Tolerance Patient tolerated treatment well    Behavior During Therapy [facility] for tasks assessed/performed                       Past Medical History:  Diagnosis Date   Allergic rhinitis 02/02/2016   Anemia 12/17/2009   Formatting of this note might be different from the original. Anemia  10/1 IMO update   Aortic atherosclerosis ([place]) 03/05/2020   Arthritis    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   Atypical chest pain 11/24/2015   Benign essential hypertension 05/13/2009   Qualifier: Diagnosis of  By: Larwance Sachs of this note might be different from the original. Hypertension   Benign paroxysmal positional vertigo 09/14/2021   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[place]) 07/22/2021   Body mass index (BMI) 25.0-25.9, adult 11/02/2020   Cancer ([place])    skin cancer   Cardiac murmur 07/25/2021   Carotid stenosis    a. Carotid U/S [id number]: LICA < [id number], RICA 0000000;  b.  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   Cystoid macular edema of both eyes  07/28/2020   Degenerative disc disease, lumbar 06/30/2015   Deviated nasal septum 02/18/2015   Diverticulosis 03/03/2020   DJD (degenerative joint disease) of hip 12/24/2012   Dyspnea 06/12/2011   Emphysema, unspecified ([place]) 03/05/2020   Erectile dysfunction 06/20/2017   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[place]) 02/09/2021   Gait disorder 03/02/2021   GERD (gastroesophageal reflux disease)    GOUT 05/13/2009   Qualifier: Diagnosis of  By: Burnett Kanaris     Greater trochanteric pain syndrome 07/22/2021   Hand pain, left 01/16/2022   Hearing loss 02/02/2016   Heme positive stool 02/29/2020   HIATAL HERNIA 05/13/2009   Qualifier: Diagnosis of  By: Burnett Kanaris     High risk medication use 01/18/2012   Hip pain 03/02/2021   History of chicken pox    History of COVID-19 12/05/2021   History of hemorrhoids    History of kidney stones    History of skin cancer 07/22/2021   skin cancer   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 Cardiology - Dr Frances Nickels at [facility] cardiology  ICD-10 cut over  Hypertension    under control   Hypokalemia 07/08/2021   Hypothyroidism    Internal hemorrhoids 03/03/2020   Leg cramps 11/13/2019   Lower GI bleed 12/12/2012   Lumbago of lumbar region with sciatica 10/21/2020   Lumbar radiculopathy 03/02/2021   Lumbar spondylosis 11/02/2020   NEPHROLITHIASIS 05/13/2009   Qualifier: Diagnosis of  By: Burnett Kanaris     Nonspecific abnormal electrocardiogram (ECG) (EKG) 01/06/2013   Numbness of hand 01/24/2022   Onychomycosis 06/30/2015   Osteoarthrosis, 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 Osteoarthritis Of The Hand  10/1 IMO update   Osteoarthrosis, unspecified whether generalized  or localized, pelvic region and thigh 07/25/2010   Formatting of this note might be different from the original. Osteoarthritis Of The Hip   Other abnormal glucose 07/22/2021   Other allergic rhinitis 02/18/2015   Palpitations 07/25/2021   Peripheral neuropathy 06/19/2016   Preventative health care 11/24/2015   Right groin pain 11/29/2012   Right inguinal hernia 05/13/2009   Qualifier: Diagnosis of  By: Burnett Kanaris     RLS (restless legs syndrome) 02/18/2015   Rosacea    Rotator cuff tear 07/27/2013   Situational depression 03/02/2021   Skin lesion 01/16/2022   Sleep apnea with use of continuous positive airway pressure (CPAP) 07/15/2013   CPAP set to  7 cm water,  Residual AHi was 7.8  With no leak.  User time 6 hours.  479 days , not used only 12 days - highly compliant 06-23-13  .    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   Urinary retention 11/26/2020   Ventral hernia 07/08/2012   Vertigo 07/08/2021   Weight loss 03/03/2020   Past Surgical History:  Procedure Laterality Date   ABDOMINAL HERNIA REPAIR  07/15/12   Dr [name]   ANTERIOR CERVICAL DECOMP/DISCECTOMY FUSION N/A 07/09/2020   Procedure: ANTERIOR CERVICAL DECOMPRESSION AND FUSION CERVICAL THREE-FOUR.;  Surgeon: [name], MD;  Location: [place];  Service: Neurosurgery;  Laterality: N/A;  anterior   BROW LIFT  05/07/01   COLONOSCOPY WITH PROPOFOL N/A 04/24/2022   Procedure: COLONOSCOPY WITH PROPOFOL;  Surgeon: Thornton Park, MD;  Location: [place];  Service: Gastroenterology;  Laterality: N/A;   COLONOSCOPY WITH PROPOFOL N/A 04/26/2022   Procedure: COLONOSCOPY WITH PROPOFOL;   Surgeon: Thornton Park, MD;  Location: [place];  Service: Gastroenterology;  Laterality: N/A;   CYSTOSCOPY WITH URETEROSCOPY AND STENT PLACEMENT Right 01/28/2014   Procedure: CYSTOSCOPY WITH URETEROSCOPY, BASKET RETRIVAL AND  STENT PLACEMENT;  Surgeon: [name], MD;  Location: [facility];  Service: Urology;  Laterality: Right;   epidural injections     multiple procedures   ESOPHAGEAL DILATION  1993 and 1994   multiple times   EYE SURGERY Bilateral 03/25/10, 2012   cataract removal   HEMORRHOID SURGERY  2014   HIATAL HERNIA REPAIR  12/21/93   HOLMIUM LASER APPLICATION Right [id number]   Procedure: HOLMIUM LASER APPLICATION;  Surgeon: [name], MD;  Location: [facility];  Service: Urology;  Laterality: Right;   INGUINAL HERNIA REPAIR Right 07/15/12   Dr [name], x2   JOINT REPLACEMENT  07/25/04   right knee  JOINT REPLACEMENT  07/13/10   left knee   KNEE SURGERY  1995   LAPAROSCOPIC CHOLECYSTECTOMY  09/20/06   with intraoperative cholangiogram and right inguinal herniorrhaphy with mesh    LIGAMENT REPAIR Left 07/2013   shoulder   LITHOTRIPSY     x2   NASAL SEPTUM SURGERY  1975   POLYPECTOMY  04/26/2022   Procedure: POLYPECTOMY;  Surgeon: Thornton Park, MD;  Location: [place];  Service: Gastroenterology;;   POSTERIOR CERVICAL FUSION/FORAMINOTOMY N/A 11/24/2020   Procedure: Posterior Cervical Laminectomy Cervical three-four, Cervical four-five with lateral mass fusion;  Surgeon: [name], MD;  Location: [place];  Service: Neurosurgery;  Laterality: N/A;   REFRACTIVE SURGERY Bilateral    Right total hip replacement  4/14   SKIN CANCER DESTRUCTION     nose, ear   TONSILLECTOMY  as child   TOTAL HIP ARTHROPLASTY Left    URETHRAL DILATION  1991   [name]   Patient Active Problem List   Diagnosis Date Noted   Weakness of lower extremity 10/10/2022   Skin tear of right elbow without complication [id number]   Skin tear of left lower leg without complication [id number]    Fall 09/18/2022   Iron deficiency anemia 09/18/2022   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   Vitamin D deficiency 08/07/2022   Closed fracture of left hip ([place]) 08/07/2022   Hand weakness 05/02/2022   Acute kidney injury ([place]) 05/02/2022   Adenomatous polyp of ascending colon    Adenomatous polyp of colon    Acute metabolic encephalopathy 0000000   GI bleed 04/21/2022   Vasovagal syncope    Hemorrhagic shock (HCC)    Hematochezia    Irregular heart beat 02/03/2022   Constipation 02/03/2022   Carpal tunnel syndrome 01/24/2022   Numbness of hand 01/24/2022   Ulnar neuropathy 01/24/2022   Skin lesion 01/16/2022   History of COVID-19 12/05/2021   Benign paroxysmal positional vertigo 09/14/2021   Palpitations 07/25/2021   Cardiac murmur 07/25/2021   History of chicken pox 07/22/2021   History of hemorrhoids 07/22/2021   Hypertension 07/22/2021   COPD (chronic obstructive pulmonary disease) ([place]) 07/22/2021   History of kidney stones 07/22/2021   History of skin cancer [id number]   Complication of anesthesia 07/22/2021   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[place]) 07/22/2021   Other abnormal glucose 07/22/2021   Spondylitis ([place]) 07/22/2021   Greater trochanteric pain syndrome 07/22/2021   Vertigo 07/08/2021   Hypokalemia 07/08/2021   Trochanteric bursitis of left hip 06/30/2021   Cancer ([place]) 04/11/2021   Lumbar radiculopathy 03/02/2021   Gait instability 03/02/2021   Hip pain 03/02/2021   Situational depression 03/02/2021   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[place]) 02/09/2021   Urinary retention 11/26/2020   Cervical myelopathy ([place]) 11/24/2020   Body mass index (BMI) 25.0-25.9, adult 11/02/2020   Lumbar spondylosis 11/02/2020   Lumbago of lumbar region with sciatica 10/21/2020   Cystoid macular edema of both eyes 07/28/2020   Status post cervical spinal fusion 07/09/2020   History of total bilateral knee replacement  07/08/2020   Aortic atherosclerosis ([place]) 03/05/2020   Emphysema, unspecified ([place]) 03/05/2020   Arthritis 03/04/2020   Diverticulosis 03/03/2020   Internal hemorrhoids 03/03/2020   Weight loss 03/03/2020   Heme positive stool 02/29/2020   Leg cramps 11/13/2019   Tibialis anterior tendon tear, nontraumatic 11/13/2019   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 07/29/2018   CPAP (continuous positive airway pressure) dependence 03/25/2018   Complex sleep  apnea syndrome 03/25/2018   Erectile dysfunction 06/20/2017   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 03/06/2017   Peripheral neuropathy 06/19/2016   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   Hearing loss 02/02/2016   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   Allergic rhinitis 02/02/2016   Atypical chest pain 11/24/2015   Preventative health care 11/24/2015   Onychomycosis 06/30/2015   Degenerative disc disease, lumbar 06/30/2015   Other allergic rhinitis 02/18/2015   Deviated nasal septum 02/18/2015   RLS (restless legs syndrome) 02/18/2015   GERD (gastroesophageal reflux disease) 09/18/2014   Hyperglycemia 03/03/2014   Rotator cuff tear 07/27/2013   Sleep apnea with use of continuous positive airway pressure (CPAP) 07/15/2013   Carotid stenosis 02/24/2013   Nonspecific abnormal electrocardiogram (ECG) (EKG) 01/06/2013   DJD (degenerative joint disease) of hip 12/24/2012   Lower GI bleed 12/12/2012   Right groin pain 11/29/2012   OSA (obstructive sleep apnea) 11/25/2012   Ventral hernia 07/08/2012   High risk medication use 01/18/2012   Dyspnea 06/12/2011   Hyperlipidemia with target LDL less than 130 12/27/2010   Osteoarthrosis, unspecified whether generalized or localized, pelvic region and thigh 07/25/2010   Osteoarthrosis, hand 06/13/2010   Anemia 12/17/2009   Hypothyroidism 05/13/2009   GOUT 05/13/2009   Benign essential hypertension 05/13/2009   Right inguinal hernia 05/13/2009    HIATAL HERNIA 05/13/2009   NEPHROLITHIASIS 05/13/2009   ROSACEA 05/13/2009    PCP: [name], NP   REFERRING PROVIDER: [name], NP   REFERRING DIAG: [id number] (ICD-10-CM) - S/P revision of total hip  PT eval and treat for aggressive left knee ROM, weight bearing as tolerated, balance/gait training. S/p revision of left hip replacement at [facility]. (Dr. Linton Rump). No precautions.  THERAPY DIAG:  Stiffness of left knee, not elsewhere classified  Muscle weakness (generalized)  Unsteadiness on feet  Other abnormalities of gait and mobility  Repeated falls  Other low back pain  RATIONALE FOR EVALUATION AND TREATMENT: Rehabilitation  ONSET DATE: 07/02/22  NEXT MD VISIT:  01/22/23    SUBJECTIVE:                                                                                                                                                                                                         SUBJECTIVE STATEMENT:  Pt denies pain but reports his knee still feels very weak. He reports another fall in the bedroom since his last PT visit - standing trying to use a shoehorn to get his foot in his shoe and lost his balance.  PAIN: Are  you having pain? No  PERTINENT HISTORY:  Revision of femoral component of L THA on 07/02/22 s/p fall with periprosthetic fracture; Aortic atherosclerosis; carotid stenosis; cervical myelopathy s/p ACDF 06/2020 and posterior cervical fusion 11/2020; peripheral neuropathy; lumbar DDD/spondylosis; lumbago with sciatica/lumbar radiculopathy; OA; B TKA; BTHA; L greater trochanteric pain syndrome; L RCR 2014; recent CTR surgery - early May; hearing loss; HTN; BPPV/vertigo; B sacroiliitis; skin cancer; COPD/emphysema; sleep apnea with CPAP; GERD; gout; HLD; LE cramps; RLS   PRECAUTIONS: Fall  WEIGHT BEARING RESTRICTIONS: No - FWB on left as of 08/18/22  FALLS:  Has patient fallen in last 6 months? Yes. 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 The proximal femur fracture looks healed as I cannot see the fracture line in any view. Good leg lengths.  Right hip replacement is cementless and looks well-fixed with a little varus of the tibial stem.  No bony lesion or arthritic change or other abnormality except some lumbar disc degeneration   07/21/2022 - ORTHO XR PELVIS 2 OR 3 VW UNILATERAL HIP: FINDINGS: AP and lateral views of left hip were obtained and interpreted.  Status post new left total hip revision with wire fixation appearing in good position. Good limb length. No new fracture, bony lesion, or other abnormal findings.  07/21/2022 - ORTHO XR FEMUR 2 VW UNILATERAL: FINDINGS: 3 views of the left femur were obtained and interpreted. Status post new left total hip revision with wire fixation in good position. Previous left total knee arthroplasty. No new fracture, bony lesion, or other abnormal findings.   07/02/2022 - DG HIP (WITH OR WITHOUT PELVIS) 2-3V LEFT :  FINDINGS: Three fluoroscopic spot views of the hip obtained in the operating room. Cerclage wire  fixation of periprosthetic fracture, in improved alignment. Fluoroscopy time 12 seconds. Dose 1.23 mGy. IMPRESSION: Fluoroscopic spot views of the hip during periprosthetic fracture fixation.   07/01/2022 - XR THIGH/FEMUR LEFT 2V & XR PELVIS LIMITED 1 OR 2 VIEWS AP: FINDINGS: Degenerative changes in the lower lumbar spine. Pelvis is intact. SI joints and symphysis pubis are not displaced. Right total hip arthroplasty with non cemented components. Tip of the arthroplasty is not included within the field of view. Visualized components appear well seated. Left total hip arthroplasty. The left hip demonstrates an acute periprosthetic fracture of the proximal femur with lateral displacement and mild distraction of the distal fracture fragment. No evidence of dislocation of the prosthesis. The midshaft and distal left femur are intact. Postoperative left knee arthroplasty. IMPRESSION: 1. Left hip arthroplasty with periprosthetic fracture of the proximal femur. 2. Incidental note  of right hip and left knee arthroplasties.   PATIENT SURVEYS:  LEFS 19 / 80 = 23.8 %  COGNITION: Overall cognitive status: Within functional limits for tasks assessed    SENSATION: Peripheral neuropathy in B feet    EDEMA:  Mild to moderate edema in distal B LE, L>R  MUSCLE LENGTH: (09/07/22) Hamstrings: mod tight R>L ITB: mod tight B Piriformis: mod/severe tight B Hip flexors:  Quads:  Heelcord:   POSTURE:  rounded shoulders, forward head, flexed trunk , and weight shift right  PALPATION: Increased muscle tension throughout left quads and IT band as well as hamstrings  LOWER EXTREMITY ROM:  Active ROM Right eval Left eval Left 2/1 and 2/6 Left 10/26/22 Left 11/21/22  Hip flexion       Hip extension       Hip abduction       Hip adduction       Hip internal rotation       Hip external rotation       Knee flexion 115 92 98 95 104  Knee extension 0 0   0  Ankle dorsiflexion  limited     Ankle  plantarflexion       Ankle inversion       Ankle eversion        Passive ROM Left eval Left 10/26/22  Knee flexion 95 95  (Blank rows = not tested)  LOWER EXTREMITY MMT:  MMT Right Eval* Left Eval* Right 2/1 Left 2/1 Right 10/17/22 Left 10/17/22 Right 11/21/22 Left 11/21/22  Hip flexion 3+ 3+ 3+ 3+ 3+ 3+ 3+ 3+  Hip extension 4- 4- 4- 4-   4 in S/L 4 in S/L  Hip abduction 4 4- 4 4- '4 4 4 '$ *, 3- **   4 *, 3- **   Hip adduction 4 4 4+ 4+ 4+ 4+ 4+ *, 4 ** 4+ *, 4 **  Hip internal rotation       4- 4-  Hip external rotation       3- 2+  Knee flexion '4 4 4 '$ 4- 4- 4- 4 4-  Knee extension 4+ 4+ 4+ 4+ 4 4 4+ 4+  Ankle dorsiflexion 4+ 4     4 3+  Ankle plantarflexion          Ankle inversion          Ankle eversion           (Blank rows = not tested, * - tested in sitting, ** - standard testing position)  FUNCTIONAL TESTS:  5 times sit to stand: 14.22 sec (09/07/22); 13.81 sec (11/14/22) Timed up and go (TUG): 21.28 sec with SPC; 28.23 sec (10/03/22), 27.17 sec with 4WRW  (11/07/22) 10 meter walk test: 19.94 sec with SPC; Gait speed = 1.63 ft/sec Berg Balance Scale: 38/56; 37-45 significant risk for falls (>80%) (09/07/22); 37/56 (11/14/22) Dynamic Gait Index: 10/24; Scores of 19 or less are predicitve of falls in older community living adults (09/07/22); 14/24 (11/14/22  PLOF (As of 06/20/22): ABC scale: 730 / 1600 = 45.6% Berg: 48/56; 46-51 moderate fall risk (>50%)  10MWT = 11.50 sec with SPC; 10.06 sec w/o AD Gait speed: 2.85 ft/sec with SPC; 3.26 ft/sec w/o AD DGI: 18/24; Scores of 19 or less are predictive of falls in older community living adults  GAIT: Distance walked: 60 ft Assistive device utilized: Single point cane Level of assistance: SBA Gait pattern: step to pattern, step through pattern, decreased step length- Right, decreased stance  time- Left, decreased hip/knee flexion- Right, and decreased hip/knee flexion- Left Comments: Inconsistent step length on right creating a barrier  to versus step through pattern.  Notable gait instability with SPC.   TODAY'S TREATMENT:   11/21/22 THERAPEUTIC EXERCISE: to improve flexibility, strength and mobility.  Verbal and tactile cues throughout for technique.  NuStep - L5 x 6 min Standing R/L GTB TKE with RW support for balance 2 x 10 each Seated RTB B hip ABD/ER clam 2 x 10 Seated B hip ADD ball squeeze + R/L hip IR 2 x 10 Seated RTB alt hip ABD step-out 2 x 10 Seated RTB hip flexion march 2 x 10 STS + RTB hip ABD isometric x 10  THERAPEUTIC ACTIVITIES: Re-instruction in donning L AFO ROM & MMT assessment   11/16/22 Therapeutic Activity: to improve functional performance. Donned AFO for patient   THERAPEUTIC EXERCISE: to improve flexibility, strength and mobility.  Verbal and tactile cues throughout for technique.  NuStep - L5 x 6 min Standing church pew x 12  Standing toe raise back against counter x 10- R knee buckled x 1 Seated ball squeeze hip ADD 10x Seated clams w/ RTB 2x10 Standing march x 10 bil Standing sidestep and back (about 1.5 ft out to side) x 10 bil Standing hip abduction x 10 bil Standing hip extension x 10 bil   11/14/22 THERAPEUTIC EXERCISE: to improve flexibility, strength and mobility.  Verbal and tactile cues throughout for technique.  NuStep - L4 x 7 min  THERAPEUTIC ACTIVITIES: Re-instruction in donning L AFO with reminder to monitor for friction/skin breakdown - no issues apparent today but pt not wearing AFO on arrival to PT Review of safe transfers with rollator 5xSTS = 13.81 sec w/o UE assist Berg: 37/56 DGI: 15/24   PATIENT EDUCATION:  Education details: HEP review, transfer safety, and AFO management and skin monitoring   Person educated: Patient Education method: Explanation, Demonstration, and Verbal cues Education comprehension: verbalized understanding and verbal cues required  HOME EXERCISE PROGRAM: Access Code: [email or web address] URL: [email or web address] Date:  10/03/2022 Prepared by: Almyra Free  Exercises - Supine Heel Slide with Strap  - 2 x daily - 7 x weekly - 2 sets - 10 reps - 3 sec hold - Hooklying Single Knee to Chest Stretch  - 2 x daily - 7 x weekly - 3 reps - 30 sec hold - Seated Hamstring Curls with Resistance  - 1 x daily - 7 x weekly - 2 sets - 10 reps - 3 sec hold - Seated Knee Extension with Resistance  - 1 x daily - 7 x weekly - 2 sets - 10 reps - Standing March with Counter Support  - 1 x daily - 7 x weekly - 2 sets - 10 reps - Seated Hamstring Stretch  - 2 x daily - 7 x weekly - 3 reps - 30 sec hold - Standing ITB Stretch  - 2 x daily - 7 x weekly - 3 reps - 30 sec hold - Standing Hip Flexor Stretch  - 2 x daily - 7 x weekly - 2 sets - 10 reps - 30 sec hold - Seated Hip Internal Rotation AROM (Mirrored)  - 1 x daily - 7 x weekly - 3 sets - 10 reps  ASSESSMENT:  CLINICAL IMPRESSION:  [name] Pratt" reports another fall since his last PT visit while standing trying to use a shoe horn to help put his shoe on.  He feels like some of his instability comes from his  knees wanting to buckle, although MMT revealing greatest weakness proximally at hips with quads and hamstrings 4- to 4+/5.  Reviewed current HEP reinforcing exercises to address hip weakness identified today and encouraged patient to perform at least every other day.  He continues to demonstrate better gait pattern with more consistent L foot clearance when able to wear his AFO but remains unable to don the AFO without assistance.  [name] Pratt is nearing the end of his current certification and insurance authorization, but given ongoing weakness and recurrent falls will most likely recommend recert at end of current POC.  OBJECTIVE IMPAIRMENTS: Abnormal gait, decreased activity tolerance, decreased balance, decreased coordination, decreased endurance, decreased knowledge of condition, decreased knowledge of use of DME, decreased mobility, difficulty walking, decreased ROM, decreased  strength, decreased safety awareness, increased fascial restrictions, impaired perceived functional ability, impaired flexibility, impaired sensation, improper body mechanics, postural dysfunction, and pain.   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   REHAB POTENTIAL: Good  CLINICAL DECISION MAKING: Evolving/moderate complexity  EVALUATION COMPLEXITY: Moderate   GOALS: Goals reviewed with patient? Yes  SHORT TERM GOALS: Target date: 10/03/2022   Patient will be independent with initial HEP. Baseline:  Goal status: MET  2.  Complete balance testing and establish additional LTGs as appropriate. Baseline:  Goal status: MET 09/07/22  3.  Patient will demonstrate decreased TUG time to </= 16 sec to decrease risk for falls with transitional mobility Baseline: 21.28 sec, 10/03/22 - 28.23 sec, 11/07/21 27.17 sec Goal status: IN PROGRESS  LONG TERM GOALS: Target date: 10/31/2022, extended to 12/01/22   Patient will be independent with advanced/ongoing HEP to improve outcomes and carryover.  Baseline:  Goal status: IN PROGRESS  11/21/22 -  review of HEP necessary today  2.  Patient will report at least 50-75% improvement in low back and Lhip pain to improve QOL. Baseline: 7/10 Goal status: IN PROGRESS  3.  Patient will demonstrate improved L knee AROM to [facility] to allow for normal gait and stair mechanics. Baseline: AROM 0-92, PROM 0-95 Goal status: IN PROGRESS  11/21/22 - L knee AROM 0-104  4.  Patient will demonstrate improved B LE strength to >/= 4 to 4+/5 for improved stability and ease of mobility. Baseline:  Goal status: IN PROGRESS  11/21/22 - refer to above MMT table  5.  Patient will be able to ambulate 600' with LRAD and normal gait pattern, good stability and without increased pain to safely access community.  Baseline:  Goal status: IN PROGRESS  6. Patient will be able to ascend/descend stairs with 1 HR and reciprocal step pattern safely to access home and community.  Baseline:  Goal status: IN PROGRESS  10/17/22 - able to complete stairs with 1HR however resulting to step to pattern naturally  7.  Patient will report >/=28/80 on LEFS to demonstrate improved functional ability. Baseline:  19 / 80 = 23.8 % Goal status: IN PROGRESS  8.  Patient will demonstrate at least 19/24 on DGI to decrease risk of falls. Baseline: 10/24 (09/07/22) Goal status: IN PROGRESS  11/14/22 - 14/24  9.  Patient will improve Berg score by at least 8 points to improve safety and stability with ADLs in standing and reduce risk for falls Baseline: 38/56 (09/07/22) Goal status: IN PROGRESS  11/14/22 - 37/56   PLAN:  PT FREQUENCY: 2x/week  PT DURATION: 6 weeks  PLANNED INTERVENTIONS: Therapeutic exercises, Therapeutic activity, Neuromuscular re-education, Balance training, Gait training, Patient/Family education, Self Care, Joint mobilization, Stair training, DME instructions, Dry Needling, Electrical stimulation, Cryotherapy, Moist heat, scar mobilization, Taping, Ultrasound, Ionotophoresis '4mg'$ /ml Dexamethasone, Manual therapy, and  Re-evaluation  PLAN FOR NEXT SESSION: Re-assess LEFS; F/u with AFO as needed; continue with LE flexibility and ROM exercises, emphasizing L knee flexion; work on balance and strengthening exercises as able; update HEP accordingly.    Percival Spanish, PT 11/21/2022, 12:11 PM

## 2022-11-22 ENCOUNTER — Other Ambulatory Visit: Payer: Self-pay

## 2022-11-22 ENCOUNTER — Emergency Department (HOSPITAL_BASED_OUTPATIENT_CLINIC_OR_DEPARTMENT_OTHER)
Admission: EM | Admit: 2022-11-22 | Discharge: 2022-11-22 | Disposition: A | Discharge status: Home / Self Care | Payer: Medicare HMO | Attending: Emergency Medicine | Admitting: Emergency Medicine

## 2022-11-22 DIAGNOSIS — Z4802 Encounter for removal of sutures: Secondary | ICD-10-CM | POA: Diagnosis not present

## 2022-11-22 DIAGNOSIS — S0101XD Laceration without foreign body of scalp, subsequent encounter: Secondary | ICD-10-CM | POA: Diagnosis not present

## 2022-11-22 DIAGNOSIS — Z7982 Long term (current) use of aspirin: Secondary | ICD-10-CM | POA: Insufficient documentation

## 2022-11-22 NOTE — Discharge Instructions (Signed)
Scar care When your wound has completely healed, help decrease the size of your scar by: Wearing sunscreen over the scar or covering it with clothing when you are outside. New scars get sunburned easily, which can make scarring worse. Gently massaging the scarred area. This can decrease scar thickness. General instructions Take over-the-counter and prescription medicines only as told by your health care provider. Keep all follow-up visits. This is important. Contact a health care provider if: You have more redness, swelling, or pain around your wound. You have fluid or blood coming from your wound. You have new warmth, a rash, or hardness at the wound site. You have pus or a bad smell coming from your wound. Your wound opens up. Get help right away if: You have a fever or chills. You have red streaks coming from your wound.

## 2022-11-22 NOTE — ED Notes (Addendum)
Removed 3 staples from head.  EDP in triage to see pt. No bleeding from site. No redness

## 2022-11-22 NOTE — ED Provider Notes (Signed)
[facility] Provider Note   CSN: [id number] Arrival date & time: 11/22/22  1122     History  Chief Complaint  Patient presents with   Suture / Staple Removal    [name] Pratt is a 82 y.o. male  The history is provided by the patient.  Suture / Staple Removal  Patient presents for staple removal. Denies sxs of infection.  No bleeding. He had 3 staples placed in his scalp 10 days agol.     Home Medications Prior to Admission medications   Medication Sig Start Date End Date Taking? Authorizing Provider  acetaminophen (TYLENOL) 650 MG CR tablet Take 650-1,300 mg by mouth every 8 (eight) hours as needed for pain.    [provider]  albuterol (VENTOLIN HFA) 108 (90 Base) MCG/ACT inhaler INHALE 2 PUFFS INTO THE LUNGS EVERY 6 HOURS AS NEEDED FOR SHORTNESS OF BREATH. Patient taking differently: Inhale 2 puffs into the lungs every 6 (six) hours as needed for shortness of breath. 10/20/21   [name], NP  allopurinol (ZYLOPRIM) 100 MG tablet Take 1 tablet (100 mg total) by mouth 2 (two) times daily. 08/07/22   [name], NP  amLODipine (NORVASC) 5 MG tablet Take 1.5 tablets (7.5 mg total) by mouth daily. 09/22/22   [name], NP  antiseptic oral rinse (BIOTENE) LIQD 15 mLs by Mouth Rinse route in the morning.    [provider]  aspirin EC 81 MG tablet Take 81 mg by mouth at bedtime.    [provider]  bacitracin ointment Apply 1 Application topically 2 (two) times daily. 09/15/22   [name], [name], DO  cycloSPORINE (RESTASIS) 0.05 % ophthalmic emulsion Place 1 drop into both eyes 2 (two) times daily.    [provider]  diclofenac Sodium (VOLTAREN) 1 % GEL Apply 2 g topically daily as needed (for arthritic pain in the hands).      docusate sodium (COLACE) 100 MG capsule Take 1 capsule (100 mg total) by mouth 2 (two) times daily. 04/27/22 04/27/23  Barb Merino, MD  doxycycline  (VIBRAMYCIN) 100 MG capsule Take 100 mg by mouth 2 (two) times daily. 10/19/22   [provider]  gabapentin (NEURONTIN) 300 MG capsule Take 1 capsule by mouth at bedtime. 12/02/19   [provider]  GEMTESA 75 MG TABS Take 75 mg by mouth daily. 10/20/21   [provider]  glucose blood test strip TRUE Metrix Blood Glucose Test Strip, use as instructed 11/12/20   [name], NP  Homeopathic Products ([place]) FOAM Apply 1 application topically 2 (two) times daily as needed (Pain).    [provider]  hydrALAZINE (APRESOLINE) 50 MG tablet Take 1 tablet (50 mg total) by mouth 2 (two) times daily. 08/07/22   [name], NP  Iron, Ferrous Sulfate, 325 (65 Fe) MG TABS Take 325 mg by mouth every other day. 08/10/22   [name], NP  Lancets MISC 1 each by Does not apply route 2 (two) times daily. TRUE matrix lancets 11/12/20   [name], NP  levothyroxine (SYNTHROID) 150 MCG tablet Take 150 mcg 6 days a week (patient takes 75 mcg once a week) Disp #84 for 90 day supply 08/07/22   [name], NP  levothyroxine (SYNTHROID) 75 MCG tablet Take 75 mcg once a week (patient on 150 mcg 6 days a week) Dispense #24 for 90 day supply. 08/07/22   [name], NP  loratadine (CLARITIN) 10 MG tablet Take 10  mg by mouth daily as needed for allergies or rhinitis.    [provider]  losartan (COZAAR) 100 MG tablet TAKE 1 TABLET EVERY DAY 08/10/22   [name], NP  Magnesium 500 MG TABS Take 500 mg by mouth daily.    [provider]  Multiple Vitamins-Minerals (MULTIVITAMIN WITH MINERALS) tablet Take 1 tablet by mouth daily.    [provider]  nitroGLYCERIN (NITROSTAT) 0.4 MG SL tablet Place 1 tablet (0.4 mg total) under the tongue every 5 (five) minutes as needed for chest pain. 09/16/21   [name], MD  polyethylene glycol (MIRALAX / GLYCOLAX) 17 g packet Take 17-34 g by mouth daily as  needed for mild constipation.    [provider]  polyvinyl alcohol (ARTIFICIAL TEARS) 1.4 % ophthalmic solution Place 1 drop into both eyes as needed for dry eyes.    [provider]  PRESCRIPTION MEDICATION CPAP- At bedtime and during any time of rest    [provider]  Probiotic Product (PROBIOTIC DAILY PO) Take 1 capsule by mouth daily.    [provider]  rosuvastatin (CRESTOR) 5 MG tablet Take 1 tablet (5 mg total) by mouth daily. 08/07/22   [name], NP  sodium chloride (OCEAN) 0.65 % nasal spray Place 1 spray into both nostrils as needed for congestion.    [provider]  traMADol (ULTRAM) 50 MG tablet Take 1 tablet (50 mg total) by mouth every 12 (twelve) hours as needed. 01/16/22   [name], NP  vitamin B-12 (CYANOCOBALAMIN) 500 MCG tablet Take 500 mcg by mouth daily.    [provider]      Allergies    Pravastatin, Sildenafil, Oxycodone, Atenolol, Elastic bandages & [zinc], and Metoclopramide hcl    Review of Systems   Review of Systems  Physical Exam Updated Vital Signs BP 131/63   Pulse (!) 56   Resp 16   SpO2 98%  Physical Exam Vitals and nursing note reviewed.  Constitutional:      General: He is not in acute distress.    Appearance: He is well-developed. He is not diaphoretic.  HENT:     Head: Normocephalic and atraumatic.     Comments: 3 staples left parietal region, is well-healed Eyes:     General: No scleral icterus.    Conjunctiva/sclera: Conjunctivae normal.  Cardiovascular:     Rate and Rhythm: Normal rate and regular rhythm.     Heart sounds: Normal heart sounds.  Pulmonary:     Effort: Pulmonary effort is normal. No respiratory distress.     Breath sounds: Normal breath sounds.  Abdominal:     Palpations: Abdomen is soft.     Tenderness: There is no abdominal tenderness.  Musculoskeletal:     Cervical back: Normal range of motion and neck supple.  Skin:    General: Skin  is warm and dry.  Neurological:     Mental Status: He is alert.  Psychiatric:        Behavior: Behavior normal.     ED Results / Procedures / Treatments   Labs (all labs ordered are listed, but only abnormal results are displayed) Labs Reviewed - No data to display  EKG None  Radiology No results found.  Procedures .Suture Removal  Date/Time: 11/22/2022 12:00 PM  Performed by: Margarita Mail, PA-C Authorized by: Margarita Mail, PA-C   Consent:    Consent obtained:  Verbal   Risks discussed:  Bleeding, pain and wound separation Universal protocol:  Patient identity confirmed:  Arm band Location:    Location:  Head/neck   Head/neck location:  Scalp Procedure details:    Wound appearance:  No signs of infection   Number of staples removed:  3 Post-procedure details:    Procedure completion:  Tolerated well, no immediate complications     Medications Ordered in ED Medications - No data to display  ED Course/ Medical Decision Making/ A&P                             Medical Decision Making Staple removal   Pt to ER for staple/suture removal and wound check as above. Procedure tolerated well. Vitals normal, no signs of infection. Scar minimization & return precautions given at dc.             Final Clinical Impression(s) / ED Diagnoses Final diagnoses:  Encounter for staple removal    Rx / DC Orders ED Discharge Orders     None         Margarita Mail, PA-C 11/22/22 [place], [place], DO 11/22/22 1431

## 2022-11-22 NOTE — ED Triage Notes (Addendum)
Pt presents for staple removal posterior head. No signs of infection noted

## 2022-11-22 NOTE — ED Notes (Signed)
Reviewed discharge instructions. After care for staple removal provided to pt. Pt states understanding. Ambulatory with rollator at [facility]

## 2022-11-23 ENCOUNTER — Ambulatory Visit: Payer: Medicare HMO

## 2022-11-23 ENCOUNTER — Telehealth: Payer: Self-pay | Admitting: *Deleted

## 2022-11-23 DIAGNOSIS — M6281 Muscle weakness (generalized): Secondary | ICD-10-CM | POA: Diagnosis not present

## 2022-11-23 DIAGNOSIS — R2689 Other abnormalities of gait and mobility: Secondary | ICD-10-CM

## 2022-11-23 DIAGNOSIS — M25662 Stiffness of left knee, not elsewhere classified: Secondary | ICD-10-CM

## 2022-11-23 DIAGNOSIS — M5459 Other low back pain: Secondary | ICD-10-CM | POA: Diagnosis not present

## 2022-11-23 DIAGNOSIS — R296 Repeated falls: Secondary | ICD-10-CM

## 2022-11-23 DIAGNOSIS — R2681 Unsteadiness on feet: Secondary | ICD-10-CM | POA: Diagnosis not present

## 2022-11-23 DIAGNOSIS — M48062 Spinal stenosis, lumbar region with neurogenic claudication: Secondary | ICD-10-CM | POA: Diagnosis not present

## 2022-11-23 NOTE — Telephone Encounter (Signed)
   Pre-operative Risk Assessment    Patient Name: [name] Pratt  DOB: 05/22/1941 MRN: [id number]      Request for Surgical Clearance    Procedure:   L2-3,L3-4 Lumbar Decompression/diskectomy  Date of Surgery:  Clearance TBD                                 Surgeon:  Dr. [name] Surgeon's Group or Practice Name:  [facility] Phone number:  [phone] Fax number:  [phone]   Type of Clearance Requested:   - Medical  - Pharmacy:  Hold Aspirin Not Indicated   Type of Anesthesia:  General    Additional requests/questions:    Signed, Greer Ee   11/23/2022, 2:32 PM

## 2022-11-23 NOTE — Therapy (Signed)
OUTPATIENT PHYSICAL THERAPY TREATMENT     Patient Name: [name] Pratt MRN: [id number] DOB:04-22-1941, 82 y.o., male Today's Date: 11/23/2022   END OF SESSION:  PT End of Session - 11/23/22 1321     Visit Number 19    Number of Visits 22    Date for PT Re-Evaluation 12/01/22    Authorization Type [name]    Authorization Time Period through 12/01/22    Authorization - Visit Number 9    Authorization - Number of Visits 10    Progress Note Due on Visit 20    PT Start Time 1315    PT Stop Time 1401    PT Time Calculation (min) 46 min    Activity Tolerance Patient tolerated treatment well    Behavior During Therapy [facility] for tasks assessed/performed                        Past Medical History:  Diagnosis Date   Allergic rhinitis 02/02/2016   Anemia 12/17/2009   Formatting of this note might be different from the original. Anemia  10/1 IMO update   Aortic atherosclerosis ([place]) 03/05/2020   Arthritis    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   Atypical chest pain 11/24/2015   Benign essential hypertension 05/13/2009   Qualifier: Diagnosis of  By: Larwance Sachs of this note might be different from the original. Hypertension   Benign paroxysmal positional vertigo 09/14/2021   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[place]) 07/22/2021   Body mass index (BMI) 25.0-25.9, adult 11/02/2020   Cancer ([place])    skin cancer   Cardiac murmur 07/25/2021   Carotid stenosis    a. Carotid U/S [id number]: LICA < [id number], RICA 0000000;  b.  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   Cystoid macular edema of both eyes  07/28/2020   Degenerative disc disease, lumbar 06/30/2015   Deviated nasal septum 02/18/2015   Diverticulosis 03/03/2020   DJD (degenerative joint disease) of hip 12/24/2012   Dyspnea 06/12/2011   Emphysema, unspecified ([place]) 03/05/2020   Erectile dysfunction 06/20/2017   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[place]) 02/09/2021   Gait disorder 03/02/2021   GERD (gastroesophageal reflux disease)    GOUT 05/13/2009   Qualifier: Diagnosis of  By: Burnett Kanaris     Greater trochanteric pain syndrome 07/22/2021   Hand pain, left 01/16/2022   Hearing loss 02/02/2016   Heme positive stool 02/29/2020   HIATAL HERNIA 05/13/2009   Qualifier: Diagnosis of  By: Burnett Kanaris     High risk medication use 01/18/2012   Hip pain 03/02/2021   History of chicken pox    History of COVID-19 12/05/2021   History of hemorrhoids    History of kidney stones    History of skin cancer 07/22/2021   skin cancer   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 Cardiology - Dr Frances Nickels at [facility] cardiology  ICD-10 cut over  Hypertension    under control   Hypokalemia 07/08/2021   Hypothyroidism    Internal hemorrhoids 03/03/2020   Leg cramps 11/13/2019   Lower GI bleed 12/12/2012   Lumbago of lumbar region with sciatica 10/21/2020   Lumbar radiculopathy 03/02/2021   Lumbar spondylosis 11/02/2020   NEPHROLITHIASIS 05/13/2009   Qualifier: Diagnosis of  By: Burnett Kanaris     Nonspecific abnormal electrocardiogram (ECG) (EKG) 01/06/2013   Numbness of hand 01/24/2022   Onychomycosis 06/30/2015   Osteoarthrosis, 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 Osteoarthritis Of The Hand  10/1 IMO update   Osteoarthrosis, unspecified whether generalized  or localized, pelvic region and thigh 07/25/2010   Formatting of this note might be different from the original. Osteoarthritis Of The Hip   Other abnormal glucose 07/22/2021   Other allergic rhinitis 02/18/2015   Palpitations 07/25/2021   Peripheral neuropathy 06/19/2016   Preventative health care 11/24/2015   Right groin pain 11/29/2012   Right inguinal hernia 05/13/2009   Qualifier: Diagnosis of  By: Burnett Kanaris     RLS (restless legs syndrome) 02/18/2015   Rosacea    Rotator cuff tear 07/27/2013   Situational depression 03/02/2021   Skin lesion 01/16/2022   Sleep apnea with use of continuous positive airway pressure (CPAP) 07/15/2013   CPAP set to  7 cm water,  Residual AHi was 7.8  With no leak.  User time 6 hours.  479 days , not used only 12 days - highly compliant 06-23-13  .    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   Urinary retention 11/26/2020   Ventral hernia 07/08/2012   Vertigo 07/08/2021   Weight loss 03/03/2020   Past Surgical History:  Procedure Laterality Date   ABDOMINAL HERNIA REPAIR  07/15/12   Dr [name]   ANTERIOR CERVICAL DECOMP/DISCECTOMY FUSION N/A 07/09/2020   Procedure: ANTERIOR CERVICAL DECOMPRESSION AND FUSION CERVICAL THREE-FOUR.;  Surgeon: [name], MD;  Location: [place];  Service: Neurosurgery;  Laterality: N/A;  anterior   BROW LIFT  05/07/01   COLONOSCOPY WITH PROPOFOL N/A 04/24/2022   Procedure: COLONOSCOPY WITH PROPOFOL;  Surgeon: Thornton Park, MD;  Location: [place];  Service: Gastroenterology;  Laterality: N/A;   COLONOSCOPY WITH PROPOFOL N/A 04/26/2022   Procedure: COLONOSCOPY WITH PROPOFOL;   Surgeon: Thornton Park, MD;  Location: [place];  Service: Gastroenterology;  Laterality: N/A;   CYSTOSCOPY WITH URETEROSCOPY AND STENT PLACEMENT Right 01/28/2014   Procedure: CYSTOSCOPY WITH URETEROSCOPY, BASKET RETRIVAL AND  STENT PLACEMENT;  Surgeon: [name], MD;  Location: [facility];  Service: Urology;  Laterality: Right;   epidural injections     multiple procedures   ESOPHAGEAL DILATION  1993 and 1994   multiple times   EYE SURGERY Bilateral 03/25/10, 2012   cataract removal   HEMORRHOID SURGERY  2014   HIATAL HERNIA REPAIR  12/21/93   HOLMIUM LASER APPLICATION Right [id number]   Procedure: HOLMIUM LASER APPLICATION;  Surgeon: [name], MD;  Location: [facility];  Service: Urology;  Laterality: Right;   INGUINAL HERNIA REPAIR Right 07/15/12   Dr [name], x2   JOINT REPLACEMENT  07/25/04   right knee  JOINT REPLACEMENT  07/13/10   left knee   KNEE SURGERY  1995   LAPAROSCOPIC CHOLECYSTECTOMY  09/20/06   with intraoperative cholangiogram and right inguinal herniorrhaphy with mesh    LIGAMENT REPAIR Left 07/2013   shoulder   LITHOTRIPSY     x2   NASAL SEPTUM SURGERY  1975   POLYPECTOMY  04/26/2022   Procedure: POLYPECTOMY;  Surgeon: Thornton Park, MD;  Location: [place];  Service: Gastroenterology;;   POSTERIOR CERVICAL FUSION/FORAMINOTOMY N/A 11/24/2020   Procedure: Posterior Cervical Laminectomy Cervical three-four, Cervical four-five with lateral mass fusion;  Surgeon: [name], MD;  Location: [place];  Service: Neurosurgery;  Laterality: N/A;   REFRACTIVE SURGERY Bilateral    Right total hip replacement  4/14   SKIN CANCER DESTRUCTION     nose, ear   TONSILLECTOMY  as child   TOTAL HIP ARTHROPLASTY Left    URETHRAL DILATION  1991   [name]   Patient Active Problem List   Diagnosis Date Noted   Weakness of lower extremity 10/10/2022   Skin tear of right elbow without complication [id number]   Skin tear of left lower leg without complication [id number]    Fall 09/18/2022   Iron deficiency anemia 09/18/2022   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   Vitamin D deficiency 08/07/2022   Closed fracture of left hip ([place]) 08/07/2022   Hand weakness 05/02/2022   Acute kidney injury ([place]) 05/02/2022   Adenomatous polyp of ascending colon    Adenomatous polyp of colon    Acute metabolic encephalopathy 0000000   GI bleed 04/21/2022   Vasovagal syncope    Hemorrhagic shock (HCC)    Hematochezia    Irregular heart beat 02/03/2022   Constipation 02/03/2022   Carpal tunnel syndrome 01/24/2022   Numbness of hand 01/24/2022   Ulnar neuropathy 01/24/2022   Skin lesion 01/16/2022   History of COVID-19 12/05/2021   Benign paroxysmal positional vertigo 09/14/2021   Palpitations 07/25/2021   Cardiac murmur 07/25/2021   History of chicken pox 07/22/2021   History of hemorrhoids 07/22/2021   Hypertension 07/22/2021   COPD (chronic obstructive pulmonary disease) ([place]) 07/22/2021   History of kidney stones 07/22/2021   History of skin cancer [id number]   Complication of anesthesia 07/22/2021   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[place]) 07/22/2021   Other abnormal glucose 07/22/2021   Spondylitis ([place]) 07/22/2021   Greater trochanteric pain syndrome 07/22/2021   Vertigo 07/08/2021   Hypokalemia 07/08/2021   Trochanteric bursitis of left hip 06/30/2021   Cancer ([place]) 04/11/2021   Lumbar radiculopathy 03/02/2021   Gait instability 03/02/2021   Hip pain 03/02/2021   Situational depression 03/02/2021   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[place]) 02/09/2021   Urinary retention 11/26/2020   Cervical myelopathy ([place]) 11/24/2020   Body mass index (BMI) 25.0-25.9, adult 11/02/2020   Lumbar spondylosis 11/02/2020   Lumbago of lumbar region with sciatica 10/21/2020   Cystoid macular edema of both eyes 07/28/2020   Status post cervical spinal fusion 07/09/2020   History of total bilateral knee replacement  07/08/2020   Aortic atherosclerosis ([place]) 03/05/2020   Emphysema, unspecified ([place]) 03/05/2020   Arthritis 03/04/2020   Diverticulosis 03/03/2020   Internal hemorrhoids 03/03/2020   Weight loss 03/03/2020   Heme positive stool 02/29/2020   Leg cramps 11/13/2019   Tibialis anterior tendon tear, nontraumatic 11/13/2019   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 07/29/2018   CPAP (continuous positive airway pressure) dependence 03/25/2018   Complex sleep  apnea syndrome 03/25/2018   Erectile dysfunction 06/20/2017   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 03/06/2017   Peripheral neuropathy 06/19/2016   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   Hearing loss 02/02/2016   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   Allergic rhinitis 02/02/2016   Atypical chest pain 11/24/2015   Preventative health care 11/24/2015   Onychomycosis 06/30/2015   Degenerative disc disease, lumbar 06/30/2015   Other allergic rhinitis 02/18/2015   Deviated nasal septum 02/18/2015   RLS (restless legs syndrome) 02/18/2015   GERD (gastroesophageal reflux disease) 09/18/2014   Hyperglycemia 03/03/2014   Rotator cuff tear 07/27/2013   Sleep apnea with use of continuous positive airway pressure (CPAP) 07/15/2013   Carotid stenosis 02/24/2013   Nonspecific abnormal electrocardiogram (ECG) (EKG) 01/06/2013   DJD (degenerative joint disease) of hip 12/24/2012   Lower GI bleed 12/12/2012   Right groin pain 11/29/2012   OSA (obstructive sleep apnea) 11/25/2012   Ventral hernia 07/08/2012   High risk medication use 01/18/2012   Dyspnea 06/12/2011   Hyperlipidemia with target LDL less than 130 12/27/2010   Osteoarthrosis, unspecified whether generalized or localized, pelvic region and thigh 07/25/2010   Osteoarthrosis, hand 06/13/2010   Anemia 12/17/2009   Hypothyroidism 05/13/2009   GOUT 05/13/2009   Benign essential hypertension 05/13/2009   Right inguinal hernia 05/13/2009    HIATAL HERNIA 05/13/2009   NEPHROLITHIASIS 05/13/2009   ROSACEA 05/13/2009    PCP: [name], NP   REFERRING PROVIDER: [name], NP   REFERRING DIAG: [phone] (ICD-10-CM) - S/P revision of total hip  PT eval and treat for aggressive left knee ROM, weight bearing as tolerated, balance/gait training. S/p revision of left hip replacement at [facility]. (Dr. Linton Rump). No precautions.  THERAPY DIAG:  Stiffness of left knee, not elsewhere classified  Muscle weakness (generalized)  Unsteadiness on feet  Other abnormalities of gait and mobility  Repeated falls  Other low back pain  RATIONALE FOR EVALUATION AND TREATMENT: Rehabilitation  ONSET DATE: 07/02/22  NEXT MD VISIT:  01/22/23    SUBJECTIVE:                                                                                                                                                                                                         SUBJECTIVE STATEMENT:  Still feeling tired, thinking that his BP being low has something to do with it. Got staples removed from head yesterday.  PAIN: Are you having pain? No  PERTINENT HISTORY:  Revision of femoral component of L THA on 07/02/22 s/p fall with  periprosthetic fracture; Aortic atherosclerosis; carotid stenosis; cervical myelopathy s/p ACDF 06/2020 and posterior cervical fusion 11/2020; peripheral neuropathy; lumbar DDD/spondylosis; lumbago with sciatica/lumbar radiculopathy; OA; B TKA; BTHA; L greater trochanteric pain syndrome; L RCR 2014; recent CTR surgery - early May; hearing loss; HTN; BPPV/vertigo; B sacroiliitis; skin cancer; COPD/emphysema; sleep apnea with CPAP; GERD; gout; HLD; LE cramps; RLS   PRECAUTIONS: Fall  WEIGHT BEARING RESTRICTIONS: No - FWB on left as of 08/18/22  FALLS:  Has patient fallen in last 6 months? Yes. 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 The proximal femur fracture looks healed as I cannot see the fracture line in any view. Good leg lengths.  Right hip replacement is cementless and looks well-fixed with a little varus of the tibial stem.  No bony lesion or arthritic change or other abnormality except some lumbar disc degeneration   07/21/2022 - ORTHO XR PELVIS 2 OR 3 VW UNILATERAL HIP: FINDINGS: AP and lateral views of left hip were obtained and interpreted.  Status post new left total hip revision with wire fixation appearing in good position. Good limb length. No new fracture, bony lesion, or other abnormal findings.  07/21/2022 - ORTHO XR FEMUR 2 VW UNILATERAL: FINDINGS: 3 views of the left femur were obtained and interpreted. Status post new left total hip revision with wire fixation in good position. Previous left total knee arthroplasty. No new fracture, bony lesion, or other abnormal findings.   07/02/2022 - DG HIP (WITH OR WITHOUT PELVIS) 2-3V LEFT :  FINDINGS: Three fluoroscopic spot views of the hip obtained in the operating room. Cerclage wire fixation of periprosthetic fracture, in improved alignment. Fluoroscopy time 12 seconds. Dose 1.23 mGy.  IMPRESSION: Fluoroscopic spot views of the hip during periprosthetic fracture fixation.   07/01/2022 - XR THIGH/FEMUR LEFT 2V & XR PELVIS LIMITED 1 OR 2 VIEWS AP: FINDINGS: Degenerative changes in the lower lumbar spine. Pelvis is intact. SI joints and symphysis pubis are not displaced. Right total hip arthroplasty with non cemented components. Tip of the arthroplasty is not included within the field of view. Visualized components appear well seated. Left total hip arthroplasty. The left hip demonstrates an acute periprosthetic fracture of the proximal femur with lateral displacement and mild distraction of the distal fracture fragment. No evidence of dislocation of the prosthesis. The midshaft and distal left femur are intact. Postoperative left knee arthroplasty. IMPRESSION: 1. Left hip arthroplasty with periprosthetic fracture of the proximal femur. 2. Incidental note of right hip and left knee arthroplasties.   PATIENT SURVEYS:  LEFS 19 / 80 = 23.8 %  COGNITION: Overall cognitive status: Within functional limits for tasks assessed    SENSATION: Peripheral neuropathy in B feet    EDEMA:  Mild to moderate edema in distal B LE, L>R  MUSCLE LENGTH: (09/07/22) Hamstrings: mod tight R>L ITB: mod tight B Piriformis: mod/severe tight B Hip flexors:  Quads:  Heelcord:   POSTURE:  rounded shoulders, forward head, flexed trunk , and weight shift right  PALPATION: Increased muscle tension throughout left quads and IT band as well as hamstrings  LOWER EXTREMITY ROM:  Active ROM Right eval Left eval Left 2/1 and 2/6 Left 10/26/22 Left 11/21/22  Hip flexion       Hip extension       Hip abduction       Hip adduction       Hip internal rotation       Hip external rotation       Knee flexion 115 92 98 95 104  Knee extension 0 0   0  Ankle dorsiflexion  limited     Ankle plantarflexion       Ankle inversion       Ankle eversion        Passive ROM Left eval Left 10/26/22  Knee  flexion 95 95  (Blank rows = not tested)  LOWER EXTREMITY MMT:  MMT Right Eval* Left Eval* Right 2/1 Left 2/1 Right 10/17/22 Left 10/17/22 Right 11/21/22 Left 11/21/22  Hip flexion 3+ 3+ 3+ 3+ 3+ 3+ 3+ 3+  Hip extension 4- 4- 4- 4-   4 in S/L 4 in S/L  Hip abduction 4 4- 4 4- '4 4 4 '$ *, 3- **   4 *, 3- **   Hip adduction 4 4 4+ 4+ 4+ 4+ 4+ *, 4 ** 4+ *, 4 **  Hip internal rotation       4- 4-  Hip external rotation       3- 2+  Knee flexion '4 4 4 '$ 4- 4- 4- 4 4-  Knee extension 4+ 4+ 4+ 4+ 4 4 4+ 4+  Ankle dorsiflexion 4+ 4     4 3+  Ankle plantarflexion          Ankle inversion          Ankle eversion           (Blank rows = not tested, * - tested in sitting, ** - standard testing position)  FUNCTIONAL TESTS:  5 times sit to stand: 14.22 sec (09/07/22); 13.81 sec (11/14/22) Timed up and go (TUG): 21.28 sec with SPC; 28.23 sec (10/03/22), 27.17 sec with 4WRW  (11/07/22) 10 meter walk test: 19.94 sec with SPC; Gait speed = 1.63 ft/sec Berg Balance Scale: 38/56; 37-45 significant risk for falls (>80%) (09/07/22); 37/56 (11/14/22) Dynamic Gait Index: 10/24; Scores of 19 or less are predicitve of falls in older community living adults (09/07/22); 14/24 (11/14/22  PLOF (As of 06/20/22): ABC scale: 730 / 1600 = 45.6% Berg: 48/56; 46-51 moderate fall risk (>50%)  10MWT = 11.50 sec with SPC; 10.06 sec w/o AD Gait speed: 2.85 ft/sec with SPC; 3.26 ft/sec w/o AD DGI: 18/24; Scores of 19 or less are predictive of falls in older community living adults  GAIT: Distance walked: 60 ft Assistive device utilized: Single point cane Level of assistance: SBA Gait pattern: step to pattern, step through pattern, decreased step length- Right, decreased stance time- Left, decreased hip/knee flexion- Right, and decreased hip/knee flexion- Left Comments: Inconsistent step length on right creating a barrier  to versus step through pattern.  Notable gait instability with SPC.   TODAY'S TREATMENT:   11/23/22 THERAPEUTIC EXERCISE: to improve flexibility, strength and mobility.  Verbal and tactile cues throughout for technique.  NuStep - L5 x 6 min Standing heel raise 2x10 Standing march x 10 bil bil suppport Standing hip abduction x 10 bil support Step ups 4' fwd x 10 bil with bil support Lat pulls seated 20# 2x10 Knee extension 15#  11/21/22 THERAPEUTIC EXERCISE: to improve flexibility, strength and mobility.  Verbal and tactile cues throughout for technique.  NuStep - L5 x 6 min Standing R/L GTB TKE with RW support for balance 2 x 10 each Seated RTB B hip ABD/ER clam 2 x 10 Seated B hip ADD ball squeeze + R/L hip IR 2 x 10 Seated RTB alt hip ABD step-out 2 x 10 Seated RTB hip flexion march 2 x 10 STS + RTB hip ABD isometric x 10  THERAPEUTIC ACTIVITIES: Re-instruction in donning L AFO ROM & MMT assessment   11/16/22 Therapeutic Activity: to improve functional performance. Donned AFO for patient   THERAPEUTIC EXERCISE: to improve flexibility, strength and mobility.  Verbal and tactile cues throughout for technique.  NuStep - L5 x 6 min Standing church pew x 12  Standing toe raise back against counter x 10- R knee buckled x 1 Seated ball squeeze hip ADD 10x Seated clams w/ RTB 2x10 Standing march x 10 bil Standing sidestep and back (about 1.5 ft out to side) x 10 bil Standing hip abduction x 10 bil Standing hip extension x 10 bil   11/14/22 THERAPEUTIC EXERCISE: to improve flexibility, strength and mobility.  Verbal and tactile cues throughout for technique.  NuStep - L4 x 7 min  THERAPEUTIC ACTIVITIES: Re-instruction in donning L AFO with reminder to monitor for friction/skin breakdown - no issues apparent today but pt not wearing AFO on arrival to PT Review of safe transfers with rollator 5xSTS = 13.81 sec w/o UE assist Berg: 37/56 DGI: 15/24   PATIENT EDUCATION:  Education details: HEP review, transfer safety, and AFO management and skin monitoring   Person  educated: Patient Education method: Explanation, Demonstration, and Verbal cues Education comprehension: verbalized understanding and verbal cues required  HOME EXERCISE PROGRAM: Access Code: [email or web address] URL: [email or web address] Date: 10/03/2022 Prepared by: Almyra Free  Exercises - Supine Heel Slide with Strap  - 2 x daily - 7 x weekly - 2 sets - 10 reps - 3 sec hold - Hooklying Single Knee to Chest Stretch  - 2 x daily - 7 x weekly - 3 reps - 30 sec hold - Seated Hamstring Curls with Resistance  - 1 x daily - 7 x weekly - 2 sets - 10 reps - 3 sec hold - Seated Knee Extension with Resistance  - 1 x daily - 7 x weekly - 2 sets - 10 reps - Standing March with Counter Support  - 1 x daily - 7 x weekly - 2 sets - 10 reps - Seated Hamstring Stretch  - 2 x daily - 7 x weekly - 3 reps - 30 sec hold - Standing ITB Stretch  - 2 x daily - 7 x weekly - 3 reps - 30 sec hold - Standing Hip Flexor Stretch  - 2 x daily - 7 x weekly - 2 sets - 10 reps - 30 sec hold - Seated Hip Internal Rotation AROM (Mirrored)  - 1 x daily - 7 x weekly - 3 sets - 10 reps  ASSESSMENT:  CLINICAL IMPRESSION:  Continued working on general strengthening and endurance. Pt showed good tolerance for exercises but postural cues given with the step ups for full trunk extension. He continues to have fatigue with standing activity. Pt would continue to benefit from skilled therapy.   OBJECTIVE IMPAIRMENTS: Abnormal gait, decreased activity tolerance, decreased balance, decreased coordination, decreased endurance, decreased knowledge of condition, decreased knowledge of use of DME, decreased mobility, difficulty walking, decreased ROM, decreased strength, decreased safety awareness, increased fascial restrictions, impaired perceived functional ability, impaired flexibility, impaired sensation, improper body mechanics, postural dysfunction, and pain.   ACTIVITY LIMITATIONS: carrying, lifting, bending, sitting, standing,  squatting, sleeping, stairs, transfers, bed mobility, bathing, dressing, locomotion level, and caring for others  PARTICIPATION LIMITATIONS: meal prep, cleaning, laundry, driving, shopping, community activity, and yard work  PERSONAL FACTORS: Age, Fitness, Past/current experiences, Time since onset of injury/illness/exacerbation, and 3+ comorbidities: Aortic atherosclerosis; carotid stenosis; cervical myelopathy s/p ACDF 06/2020 and posterior cervical fusion 11/2020; peripheral neuropathy; lumbar DDD/spondylosis; lumbago with sciatica/lumbar radiculopathy; OA; B TKA; BTHA; L greater trochanteric pain syndrome; L RCR 2014; 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   REHAB POTENTIAL: Good  CLINICAL DECISION MAKING: Evolving/moderate complexity  EVALUATION COMPLEXITY: Moderate   GOALS: Goals reviewed with patient? Yes  SHORT TERM GOALS: Target date: 10/03/2022   Patient will be independent with initial HEP. Baseline:  Goal status: MET  2.  Complete balance testing and establish additional LTGs as appropriate. Baseline:  Goal status: MET 09/07/22  3.  Patient will demonstrate decreased TUG time to </= 16 sec to decrease risk for falls with transitional mobility Baseline: 21.28 sec, 10/03/22 - 28.23 sec, 11/07/21 27.17 sec Goal status: IN PROGRESS  LONG TERM GOALS: Target date: 10/31/2022, extended to 12/01/22   Patient will be independent with advanced/ongoing HEP to improve outcomes and carryover.  Baseline:  Goal status: IN PROGRESS  11/21/22 - review of HEP necessary today  2.  Patient will report at least 50-75% improvement in low back and Lhip pain to improve QOL. Baseline: 7/10 Goal status: IN PROGRESS  3.  Patient will demonstrate improved L knee AROM to [facility] to allow for normal gait and stair  mechanics. Baseline: AROM 0-92, PROM 0-95 Goal status: IN PROGRESS  11/21/22 - L knee AROM 0-104  4.  Patient will demonstrate improved B LE strength to >/= 4 to 4+/5 for improved stability and ease of mobility. Baseline:  Goal status: IN PROGRESS  11/21/22 - refer to above MMT table  5.  Patient will be able to ambulate 600' with LRAD and normal gait pattern, good stability and without increased pain to safely access community.  Baseline:  Goal status: IN PROGRESS  6. Patient will be able to ascend/descend stairs with 1 HR and reciprocal step pattern safely to access home and community.  Baseline:  Goal status: IN PROGRESS  10/17/22 - able to complete stairs with 1HR however resulting to step to pattern naturally  7.  Patient will report >/=28/80 on LEFS to demonstrate improved functional ability. Baseline: 19 / 80 = 23.8 % Goal status: IN PROGRESS  8.  Patient will demonstrate at least 19/24 on DGI to decrease risk of falls. Baseline: 10/24 (09/07/22) Goal status: IN PROGRESS  11/14/22 - 14/24  9.  Patient will improve Berg score by at least 8 points to improve safety  and stability with ADLs in standing and reduce risk for falls Baseline: 38/56 (09/07/22) Goal status: IN PROGRESS  11/14/22 - 37/56   PLAN:  PT FREQUENCY: 2x/week  PT DURATION: 6 weeks  PLANNED INTERVENTIONS: Therapeutic exercises, Therapeutic activity, Neuromuscular re-education, Balance training, Gait training, Patient/Family education, Self Care, Joint mobilization, Stair training, DME instructions, Dry Needling, Electrical stimulation, Cryotherapy, Moist heat, scar mobilization, Taping, Ultrasound, Ionotophoresis '4mg'$ /ml Dexamethasone, Manual therapy, and Re-evaluation  PLAN FOR NEXT SESSION: Re-assess LEFS; F/u with AFO as needed; continue with LE flexibility and ROM exercises, emphasizing L knee flexion; work on balance and strengthening exercises as able; update HEP accordingly.    Artist Pais,  PTA 11/23/2022, 2:04 PM

## 2022-11-23 NOTE — Telephone Encounter (Signed)
   Name: [name] Pratt  DOB: Jan 11, 1941  MRN: [id number]  Primary Cardiologist: Jenkins Rouge, MD   Preoperative team, please contact this patient and set up a phone call appointment for further preoperative risk assessment. Please obtain consent and complete medication review. Thank you for your help.  I confirm that guidance regarding antiplatelet and oral anticoagulation therapy has been completed and, if necessary, noted below.  Regarding ASA therapy, if the surgeon feels that cessation of ASA is required in the perioperative period, it may be stopped 5-7 days prior to surgery with a plan to resume it as soon as felt to be feasible from a surgical standpoint in the post-operative period.   Lenna Sciara, NP 11/23/2022, 4:07 PM [place]

## 2022-11-23 NOTE — Telephone Encounter (Signed)
I have tried to reach the pt on [phone] as well as [phone]. I am getting a recording on both of these #'s that says the number you have dialed is not in service.   I will send FYI to requesting in hopes they may have another # to reach the pt or ask them call our office to schedule a tele visit for pre op clearance.

## 2022-11-24 ENCOUNTER — Telehealth: Payer: Self-pay | Admitting: *Deleted

## 2022-11-24 NOTE — Telephone Encounter (Signed)
Surgeon's office called. She gave [place] the new phone numbers. They were put in patient chart. She said she left a message to contact you for appt    Left message for the pt to call back and schedule a tele pre op appt.

## 2022-11-24 NOTE — Telephone Encounter (Signed)
Patient was returning call. Please advise ?

## 2022-11-24 NOTE — Telephone Encounter (Signed)
I s/w the pt and he has been scheduled for tele pre op appt 11/30/22 @ 9 am. Med rec and consent are done.     Patient Consent for Virtual Visit        [name] Pratt has provided verbal consent on 11/24/2022 for a virtual visit (video or telephone).   CONSENT FOR VIRTUAL VISIT FOR:  [name] Pratt  By participating in this virtual visit I agree to the following:  I hereby voluntarily request, consent and authorize [facility] and its employed or contracted physicians, physician assistants, nurse practitioners or other licensed health care professionals (the Practitioner), to provide me with telemedicine health care services (the "Services") as deemed necessary by the treating Practitioner. I acknowledge and consent to receive the Services by the Practitioner via telemedicine. I understand that the telemedicine visit will involve communicating with the Practitioner through live audiovisual communication technology and the disclosure of certain medical information by electronic transmission. I acknowledge that I have been given the opportunity to request an in-person assessment or other available alternative prior to the telemedicine visit and am voluntarily participating in the telemedicine visit.  I understand that I have the right to withhold or withdraw my consent to the use of telemedicine in the course of my care at any time, without affecting my right to future care or treatment, and that the Practitioner or I may terminate the telemedicine visit at any time. I understand that I have the right to inspect all information obtained and/or recorded in the course of the telemedicine visit and may receive copies of available information for a reasonable fee.  I understand that some of the potential risks of receiving the Services via telemedicine include:  Delay or interruption in medical evaluation due to technological equipment failure or disruption; Information transmitted may  not be sufficient (e.g. poor resolution of images) to allow for appropriate medical decision making by the Practitioner; and/or  In rare instances, security protocols could fail, causing a breach of personal health information.  Furthermore, I acknowledge that it is my responsibility to provide information about my medical history, conditions and care that is complete and accurate to the best of my ability. I acknowledge that Practitioner's advice, recommendations, and/or decision may be based on factors not within their control, such as incomplete or inaccurate data provided by me or distortions of diagnostic images or specimens that may result from electronic transmissions. I understand that the practice of medicine is not an exact science and that Practitioner makes no warranties or guarantees regarding treatment outcomes. I acknowledge that a copy of this consent can be made available to me via my patient portal ([facility]), or I can request a printed copy by calling the office of [place].    I understand that my insurance will be billed for this visit.   I have read or had this consent read to me. I understand the contents of this consent, which adequately explains the benefits and risks of the Services being provided via telemedicine.  I have been provided ample opportunity to ask questions regarding this consent and the Services and have had my questions answered to my satisfaction. I give my informed consent for the services to be provided through the use of telemedicine in my medical care

## 2022-11-24 NOTE — Telephone Encounter (Signed)
I s/w the pt and he has been scheduled for tele pre op appt 11/30/22 @ 9 am. Med rec and consent are done.

## 2022-11-27 DIAGNOSIS — N2581 Secondary hyperparathyroidism of renal origin: Secondary | ICD-10-CM | POA: Diagnosis not present

## 2022-11-27 DIAGNOSIS — N1832 Chronic kidney disease, stage 3b: Secondary | ICD-10-CM | POA: Diagnosis not present

## 2022-11-27 DIAGNOSIS — I129 Hypertensive chronic kidney disease with stage 1 through stage 4 chronic kidney disease, or unspecified chronic kidney disease: Secondary | ICD-10-CM | POA: Diagnosis not present

## 2022-11-27 DIAGNOSIS — H8110 Benign paroxysmal vertigo, unspecified ear: Secondary | ICD-10-CM | POA: Diagnosis not present

## 2022-11-27 DIAGNOSIS — D649 Anemia, unspecified: Secondary | ICD-10-CM | POA: Diagnosis not present

## 2022-11-28 ENCOUNTER — Encounter: Payer: Medicare HMO | Admitting: Physical Therapy

## 2022-11-30 ENCOUNTER — Encounter: Payer: Self-pay | Admitting: Physical Therapy

## 2022-11-30 ENCOUNTER — Ambulatory Visit: Payer: Medicare HMO | Attending: Internal Medicine | Admitting: Physician Assistant

## 2022-11-30 ENCOUNTER — Ambulatory Visit: Payer: Medicare HMO | Admitting: Physical Therapy

## 2022-11-30 DIAGNOSIS — M25662 Stiffness of left knee, not elsewhere classified: Secondary | ICD-10-CM

## 2022-11-30 DIAGNOSIS — R2681 Unsteadiness on feet: Secondary | ICD-10-CM | POA: Diagnosis not present

## 2022-11-30 DIAGNOSIS — M5459 Other low back pain: Secondary | ICD-10-CM | POA: Diagnosis not present

## 2022-11-30 DIAGNOSIS — R2689 Other abnormalities of gait and mobility: Secondary | ICD-10-CM | POA: Diagnosis not present

## 2022-11-30 DIAGNOSIS — M6281 Muscle weakness (generalized): Secondary | ICD-10-CM

## 2022-11-30 DIAGNOSIS — R296 Repeated falls: Secondary | ICD-10-CM

## 2022-11-30 DIAGNOSIS — Z01810 Encounter for preprocedural cardiovascular examination: Secondary | ICD-10-CM

## 2022-11-30 NOTE — Therapy (Addendum)
OUTPATIENT PHYSICAL THERAPY TREATMENT AND DISCHARGE SUMMARY  Progress Note  Reporting Period 10/17/2022 to 11/30/2022   See note below for Objective Data and Assessment of Progress/Goals.     Patient Name: [name] Pratt MRN: [id number] DOB:05-31-1941, 82 y.o., male Today's Date: 11/30/2022   END OF SESSION:  PT End of Session - 11/30/22 1312     Visit Number 20    Number of Visits 22    Date for PT Re-Evaluation 12/01/22    Authorization Type [name]    Authorization Time Period through 12/01/22    Authorization - Visit Number 10    Authorization - Number of Visits 10    Progress Note Due on Visit 20    PT Start Time 1312    PT Stop Time 1409    PT Time Calculation (min) 57 min    Activity Tolerance Patient tolerated treatment well    Behavior During Therapy [facility] for tasks assessed/performed                Past Medical History:  Diagnosis Date   Allergic rhinitis 02/02/2016   Anemia 12/17/2009   Formatting of this note might be different from the original. 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 Hypertension   Benign paroxysmal positional vertigo 09/14/2021   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HCC) 07/22/2021   Body mass index (BMI) 25.0-25.9, adult 11/02/2020   Cancer (HCC)    skin cancer   Cardiac murmur 07/25/2021   Carotid stenosis    a. Carotid U/S 5/13: LICA < 50%, RICA 50-69%;  b.  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   Cystoid macular edema of both eyes 07/28/2020   Degenerative disc disease, lumbar 06/30/2015   Deviated nasal septum 02/18/2015   Diverticulosis 03/03/2020   DJD (degenerative joint disease) of hip 12/24/2012   Dyspnea 06/12/2011   Emphysema, unspecified (HCC) 03/05/2020   Erectile dysfunction 06/20/2017   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   Gait disorder 03/02/2021   GERD (gastroesophageal reflux disease)    GOUT 05/13/2009   Qualifier: Diagnosis of  By: Kem Parkinson     Greater trochanteric pain syndrome 07/22/2021   Hand pain, left 01/16/2022   Hearing loss 02/02/2016   Heme positive stool 02/29/2020   HIATAL HERNIA 05/13/2009   Qualifier: Diagnosis of  By: Kem Parkinson     High risk medication use 01/18/2012   Hip pain 03/02/2021   History of chicken pox    History of COVID-19 12/05/2021   History of hemorrhoids    History of kidney stones    History of skin cancer 07/22/2021   skin cancer   History of total bilateral knee replacement 07/08/2020   Hyperglycemia 03/03/2014   Hyperlipidemia with target LDL less than 130 12/27/2010   Formatting of this note might be  different from the original. Cardiology - Dr Estrella Myrtle at [facility] ([facility] cardiology  ICD-10 cut over   Hypertension    under control   Hypokalemia 07/08/2021   Hypothyroidism    Internal hemorrhoids 03/03/2020   Leg cramps 11/13/2019   Lower GI bleed 12/12/2012   Lumbago of lumbar region with sciatica 10/21/2020   Lumbar radiculopathy 03/02/2021   Lumbar spondylosis 11/02/2020   NEPHROLITHIASIS 05/13/2009   Qualifier: Diagnosis of  By: Kem Parkinson     Nonspecific abnormal electrocardiogram (ECG) (EKG) 01/06/2013   Numbness of hand 01/24/2022   Onychomycosis 06/30/2015   Osteoarthrosis, hand 06/13/2010   Formatting of  this note might be different from the original. Osteoarthritis Of The Hand  10/1 IMO update   Osteoarthrosis, unspecified whether generalized or localized, pelvic region and thigh 07/25/2010   Formatting of this note might be different from the original. Osteoarthritis Of The Hip   Other abnormal glucose 07/22/2021   Other allergic rhinitis 02/18/2015   Palpitations 07/25/2021   Peripheral neuropathy 06/19/2016   Preventative health care 11/24/2015   Right groin pain 11/29/2012   Right inguinal hernia 05/13/2009   Qualifier: Diagnosis of  By: Kem Parkinson     RLS (restless legs syndrome) 02/18/2015   Rosacea    Rotator cuff tear 07/27/2013   Situational depression 03/02/2021   Skin lesion 01/16/2022   Sleep apnea with use of continuous positive airway pressure (CPAP) 07/15/2013   CPAP set to  7 cm water,  Residual AHi was 7.8  With no leak.  User time 6 hours.  479 days , not used only 12 days - highly compliant 06-23-13  .    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 Fawn Kirk   Ulnar neuropathy 01/24/2022   Urinary retention 11/26/2020   Ventral hernia 07/08/2012   Vertigo 07/08/2021   Weight loss 03/03/2020   Past Surgical History:  Procedure Laterality Date   ABDOMINAL HERNIA REPAIR  07/15/12   Dr Ashley Jacobs   ANTERIOR CERVICAL DECOMP/DISCECTOMY FUSION N/A 07/09/2020   Procedure: ANTERIOR CERVICAL DECOMPRESSION AND FUSION CERVICAL THREE-FOUR.;  Surgeon: Donalee Citrin, MD;  Location: [facility] OR;  Service: Neurosurgery;  Laterality: N/A;  anterior   BROW LIFT  05/07/01   COLONOSCOPY WITH PROPOFOL N/A 04/24/2022   Procedure: COLONOSCOPY WITH PROPOFOL;  Surgeon: Tressia Danas, MD;  Location: [facility]  ENDOSCOPY;  Service: Gastroenterology;  Laterality: N/A;   COLONOSCOPY WITH PROPOFOL N/A 04/26/2022   Procedure: COLONOSCOPY WITH PROPOFOL;  Surgeon: Tressia Danas, MD;  Location: [facility] ENDOSCOPY;  Service: Gastroenterology;  Laterality: N/A;   CYSTOSCOPY WITH URETEROSCOPY AND STENT PLACEMENT Right 01/28/2014   Procedure: CYSTOSCOPY WITH URETEROSCOPY, BASKET RETRIVAL AND  STENT PLACEMENT;  Surgeon: Valetta Fuller, MD;  Location: [facility] ORS;  Service: Urology;  Laterality: Right;   epidural injections     multiple procedures   ESOPHAGEAL DILATION  1993 and 1994   multiple times   EYE SURGERY Bilateral 03/25/10, 2012   cataract removal   HEMORRHOID SURGERY  2014   HIATAL HERNIA REPAIR  12/21/93   HOLMIUM LASER APPLICATION Right 01/28/2014   Procedure: HOLMIUM LASER APPLICATION;  Surgeon: Valetta Fuller, MD;  Location: [facility] ORS;  Service: Urology;  Laterality: Right;   INGUINAL HERNIA REPAIR  Right 07/15/12   Dr Ashley Jacobs, x2   JOINT REPLACEMENT  07/25/04   right knee   JOINT REPLACEMENT  07/13/10   left knee   KNEE SURGERY  1995   LAPAROSCOPIC CHOLECYSTECTOMY  09/20/06   with intraoperative cholangiogram and right inguinal herniorrhaphy with mesh    LIGAMENT REPAIR Left 07/2013   shoulder   LITHOTRIPSY     x2   NASAL SEPTUM SURGERY  1975   POLYPECTOMY  04/26/2022   Procedure: POLYPECTOMY;  Surgeon: Tressia Danas, MD;  Location: [facility] ENDOSCOPY;  Service: Gastroenterology;;   POSTERIOR CERVICAL FUSION/FORAMINOTOMY N/A 11/24/2020   Procedure: Posterior Cervical Laminectomy Cervical three-four, Cervical four-five with lateral mass fusion;  Surgeon: Donalee Citrin, MD;  Location: [facility] OR;  Service: Neurosurgery;  Laterality: N/A;   REFRACTIVE SURGERY Bilateral    Right total hip replacement  4/14   SKIN CANCER DESTRUCTION     nose, ear   TONSILLECTOMY  as child   TOTAL HIP ARTHROPLASTY Left    URETHRAL DILATION  1991   Dr. Annabell Howells   Patient Active Problem List   Diagnosis Date Noted   Weakness of lower  extremity 10/10/2022   Skin tear of right elbow without complication 09/18/2022   Skin tear of left lower leg without complication 09/18/2022   Fall 09/18/2022   Iron deficiency anemia 09/18/2022   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   Vitamin D deficiency 08/07/2022   Closed fracture of left hip (HCC) 08/07/2022   Hand weakness 05/02/2022   Acute kidney injury (HCC) 05/02/2022   Adenomatous polyp of ascending colon    Adenomatous polyp of colon    Acute metabolic encephalopathy 04/24/2022   GI bleed 04/21/2022   Vasovagal syncope    Hemorrhagic shock (HCC)    Hematochezia    Irregular heart beat 02/03/2022   Constipation 02/03/2022   Carpal tunnel syndrome 01/24/2022   Numbness of hand 01/24/2022   Ulnar neuropathy 01/24/2022   Skin lesion 01/16/2022   History of COVID-19 12/05/2021   Benign paroxysmal positional vertigo 09/14/2021   Palpitations 07/25/2021   Cardiac murmur 07/25/2021   History of chicken pox 07/22/2021   History of hemorrhoids 07/22/2021   Hypertension 07/22/2021   COPD (chronic obstructive pulmonary disease) (HCC) 07/22/2021   History of kidney stones 07/22/2021   History of skin cancer 07/22/2021   Complication of anesthesia 07/22/2021   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HCC) 07/22/2021   Other abnormal glucose 07/22/2021   Spondylitis (HCC) 07/22/2021   Greater trochanteric pain syndrome 07/22/2021   Vertigo 07/08/2021   Hypokalemia 07/08/2021   Trochanteric bursitis of left hip 06/30/2021   Cancer (HCC) 04/11/2021   Lumbar radiculopathy 03/02/2021   Gait instability 03/02/2021   Hip pain 03/02/2021   Situational depression 03/02/2021   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   Urinary retention 11/26/2020   Cervical myelopathy (HCC) 11/24/2020   Body mass index (BMI) 25.0-25.9, adult 11/02/2020   Lumbar spondylosis 11/02/2020   Lumbago of lumbar region with sciatica 10/21/2020    Cystoid macular edema of both eyes 07/28/2020   Status post cervical spinal fusion 07/09/2020   History of total bilateral knee replacement 07/08/2020   Aortic atherosclerosis (HCC) 03/05/2020   Emphysema, unspecified (HCC) 03/05/2020   Arthritis 03/04/2020   Diverticulosis 03/03/2020   Internal hemorrhoids 03/03/2020   Weight loss 03/03/2020   Heme positive stool 02/29/2020   Leg cramps 11/13/2019   Tibialis anterior tendon tear, nontraumatic 11/13/2019   Type 2  macular telangiectasis of both eyes 07/29/2018   CPAP (continuous positive airway pressure) dependence 03/25/2018   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   Erectile dysfunction 06/20/2017   Stenosis of right carotid artery without cerebral infarction 03/06/2017   Peripheral neuropathy 06/19/2016   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   Hearing loss 02/02/2016   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   Allergic rhinitis 02/02/2016   Atypical chest pain 11/24/2015   Preventative health care 11/24/2015   Onychomycosis 06/30/2015   Degenerative disc disease, lumbar 06/30/2015   Other allergic rhinitis 02/18/2015   Deviated nasal septum 02/18/2015   RLS (restless legs syndrome) 02/18/2015   GERD (gastroesophageal reflux disease) 09/18/2014   Hyperglycemia 03/03/2014   Rotator cuff tear 07/27/2013   Sleep apnea with use of continuous positive airway pressure (CPAP) 07/15/2013   Carotid stenosis 02/24/2013   Nonspecific abnormal electrocardiogram (ECG) (EKG) 01/06/2013   DJD (degenerative joint disease) of hip 12/24/2012   Lower GI bleed 12/12/2012   Right groin pain 11/29/2012   OSA (obstructive sleep apnea) 11/25/2012   Ventral hernia 07/08/2012   High risk medication use 01/18/2012   Dyspnea 06/12/2011   Hyperlipidemia with target LDL less than 130 12/27/2010   Osteoarthrosis, unspecified whether generalized or localized, pelvic region and thigh 07/25/2010   Osteoarthrosis, hand 06/13/2010   Anemia  12/17/2009   Hypothyroidism 05/13/2009   GOUT 05/13/2009   Benign essential hypertension 05/13/2009   Right inguinal hernia 05/13/2009   HIATAL HERNIA 05/13/2009   NEPHROLITHIASIS 05/13/2009   ROSACEA 05/13/2009    PCP: [name], NP   REFERRING PROVIDER: [name], NP   REFERRING DIAG: [phone] (ICD-10-CM) - S/P revision of total hip  PT eval and treat for aggressive left knee ROM, weight bearing as tolerated, balance/gait training. S/p revision of left hip replacement at [facility]. (Dr. Samuel Bouche). No precautions.  THERAPY DIAG:  Stiffness of left knee, not elsewhere classified  Muscle weakness (generalized)  Unsteadiness on feet  Other abnormalities of gait and mobility  Repeated falls  Other low back pain  RATIONALE FOR EVALUATION AND TREATMENT: Rehabilitation  ONSET DATE: 07/02/22  NEXT MD VISIT:  01/22/23    SUBJECTIVE:                                                                                                                                                                                                         SUBJECTIVE STATEMENT:  Pt reports another fall earlier today when he reached down to pick up a dropped credit card after letting go of his walker to pull  his billfold out of his pocket. Landed on his R buttock and elbow - he notes bruised feeling but denies significant injury or change in mobility (able to get up on his own after the fall). He reports things are progressing with the rescheduling of his back surgery and hopes it will be scheduled soon pending medical/cardiac clearance  PAIN: Are you having pain? Yes: NPRS scale: 2-3/10 Pain location: R buttock Pain description: bruised/sore Aggravating factors: recent fall, sitting on buttock Relieving factors: taking pressure off  PERTINENT HISTORY:  Revision of femoral component of L THA on 07/02/22 s/p fall with periprosthetic fracture; Aortic atherosclerosis; carotid stenosis; cervical  myelopathy s/p ACDF 06/2020 and posterior cervical fusion 11/2020; peripheral neuropathy; lumbar DDD/spondylosis; lumbago with sciatica/lumbar radiculopathy; OA; B TKA; BTHA; L greater trochanteric pain syndrome; L RCR 2014; recent CTR surgery - early May; hearing loss; HTN; BPPV/vertigo; B sacroiliitis; skin cancer; COPD/emphysema; sleep apnea with CPAP; GERD; gout; HLD; LE cramps; RLS   PRECAUTIONS: Fall  WEIGHT BEARING RESTRICTIONS: No - FWB on left as of 08/18/22  FALLS:  Has patient fallen in last 6 months? Yes. 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 Interval progression of degenerative changes at L1-2 with moderate narrowing of the bilateral subarticular zones and moderate to severe bilateral neural foraminal narrowing. 2. Stable moderate spinal canal stenosis at L2-3 with prominent narrowing of the bilateral subarticular zones, severe right and moderate left neural foraminal narrowing. 3. Stable moderate bilateral subarticular zone stenosis and moderate to severe bilateral neural foraminal narrowing at L3-4. 4. Stable moderate bilateral subarticular zone stenosis, moderate right and severe left neural  foraminal narrowing at L4-5. 5. Stable mild right and moderate left neural foraminal narrowing at L5-S1.  11/14/2022 - CT CERVICAL SPINE WITHOUT CONTRAST (s/p fall): IMPRESSION:  Postoperative and advanced degenerative changes throughout the cervical spine. No acute bony abnormality.  11/14/2022 - CT HEAD WITHOUT CONTRAST (head trauma s/p fall): IMPRESSION: Old right basal ganglia lacunar infarct. Atrophy, chronic microvascular disease. No acute intracranial abnormality.  08/18/2022 - ORTHO XR PELVIS 2 OR 3 VW UNILATERAL HIP: FINDINGS: Well-fixed cementless revision left hip replacement in excellent position. The proximal femur fracture looks healed as I cannot see the fracture line in any view. Good leg lengths.  Right hip replacement is cementless and looks well-fixed with a little varus of the tibial stem.  No bony lesion or arthritic change or other abnormality except some lumbar disc degeneration   07/21/2022 - ORTHO XR PELVIS 2 OR 3 VW UNILATERAL HIP: FINDINGS: AP and lateral views of left hip were obtained and interpreted.  Status post new left total hip revision with wire fixation appearing in  good position. Good limb length. No new fracture, bony lesion, or other abnormal findings.  07/21/2022 - ORTHO XR FEMUR 2 VW UNILATERAL: FINDINGS: 3 views of the left femur were obtained and interpreted. Status post new left total hip revision with wire fixation in good position. Previous left total knee arthroplasty. No new fracture, bony lesion, or other abnormal findings.   07/02/2022 - DG HIP (WITH OR WITHOUT PELVIS) 2-3V LEFT :  FINDINGS: Three fluoroscopic spot views of the hip obtained in the operating room. Cerclage wire fixation of periprosthetic fracture, in improved alignment. Fluoroscopy time 12 seconds. Dose 1.23 mGy. IMPRESSION: Fluoroscopic spot views of the hip during periprosthetic fracture fixation.   07/01/2022 - XR THIGH/FEMUR LEFT 2V & XR PELVIS LIMITED 1 OR 2 VIEWS AP: FINDINGS:  Degenerative changes in the lower lumbar spine. Pelvis is intact. SI joints and symphysis pubis are not displaced. Right total hip arthroplasty with non cemented components. Tip of the arthroplasty is not included within the field of view. Visualized components appear well seated. Left total hip arthroplasty. The left hip demonstrates an acute periprosthetic fracture of the proximal femur with lateral displacement and mild distraction of the distal fracture fragment. No evidence of dislocation of the prosthesis. The midshaft and distal left femur are intact. Postoperative left knee arthroplasty. IMPRESSION: 1. Left hip arthroplasty with periprosthetic fracture of the proximal femur. 2. Incidental note of right hip and left knee arthroplasties.   PATIENT SURVEYS:  LEFS 19 / 80 = 23.8 % 11/30/22 - 26 / 80 = 32.5 %  COGNITION: Overall cognitive status: Within functional limits for tasks assessed    SENSATION: Peripheral neuropathy in B feet    EDEMA:  Mild to moderate edema in distal B LE, L>R  MUSCLE LENGTH: (09/07/22) Hamstrings: mod tight R>L ITB: mod tight B Piriformis: mod/severe tight B Hip flexors:  Quads:  Heelcord:   POSTURE:  rounded shoulders, forward head, flexed trunk , and weight shift right  PALPATION: Increased muscle tension throughout left quads and IT band as well as hamstrings  LOWER EXTREMITY ROM:  Active ROM Right eval Left eval Left 2/1 and 2/6 Left 10/26/22 Left 11/21/22  Hip flexion       Hip extension       Hip abduction       Hip adduction       Hip internal rotation       Hip external rotation       Knee flexion 115 92 98 95 104  Knee extension 0 0   0  Ankle dorsiflexion  limited     Ankle plantarflexion       Ankle inversion       Ankle eversion        Passive ROM Left eval Left 10/26/22  Knee flexion 95 95  (Blank rows = not tested)  LOWER EXTREMITY MMT:  MMT Right Eval* Left Eval* Right 2/1 Left 2/1 Right 10/17/22 Left 10/17/22 Right  11/21/22 Left 11/21/22  Hip flexion 3+ 3+ 3+ 3+ 3+ 3+ 3+ 3+  Hip extension 4- 4- 4- 4-   4 in S/L 4 in S/L  Hip abduction 4 4- 4 4- 4 4 4  *, 3- **   4 *, 3- **   Hip adduction 4 4 4+ 4+ 4+ 4+ 4+ *, 4 ** 4+ *, 4 **  Hip internal rotation       4- 4-  Hip external rotation       3- 2+  Knee flexion  4 4 4  4- 4- 4- 4 4-  Knee extension 4+ 4+ 4+ 4+ 4 4 4+ 4+  Ankle dorsiflexion 4+ 4     4 3+  Ankle plantarflexion          Ankle inversion          Ankle eversion           (Blank rows = not tested, * - tested in sitting, ** - standard testing position)  FUNCTIONAL TESTS:  5 times sit to stand: 14.22 sec (09/07/22); 13.81 sec (11/14/22) Timed up and go (TUG): 21.28 sec with SPC; 28.23 sec (10/03/22), 27.17 sec with 4WRW  (11/07/22); 20.79 sec with FWW (11/30/22) 10 meter walk test: 19.94 sec with SPC, Gait speed = 1.63 ft/sec (eval); 18.53 sec with FWW, Gait speed = 1.77 ft/sec (11/30/22)  Berg Balance Scale: 38/56; 37-45 significant risk for falls (>80%) (09/07/22); 37/56 (11/14/22) Dynamic Gait Index: 10/24; Scores of 19 or less are predicitve of falls in older community living adults (09/07/22); 14/24 (11/14/22); 10/24 (11/30/22)  PLOF (As of 06/20/22): ABC scale: 730 / 1600 = 45.6% Berg: 48/56; 46-51 moderate fall risk (>50%)  = 11.50 sec with SPC; 10.06 sec w/o AD Gait speed: 2.85 ft/sec with SPC; 3.26 ft/sec w/o AD DGI: 18/24; Scores of 19 or less are predictive of falls in older community living adults  GAIT: Distance walked: 60 ft Assistive device utilized: Single point cane Level of assistance: SBA Gait pattern: step to pattern, step through pattern, decreased step length- Right, decreased stance time- Left, decreased hip/knee flexion- Right, and decreased hip/knee flexion- Left Comments: Inconsistent step length on right creating a barrier to versus step through pattern.  Notable gait instability with SPC.   TODAY'S TREATMENT:   11/30/22 THERAPEUTIC EXERCISE: to improve flexibility,  strength and mobility.  Verbal and tactile cues throughout for technique.  NuStep - L5 x 6 min  THERAPEUTIC ACTIVITIES: TUG = 20.79 sec with FWW DGI: 10/24 LEFS: 26 / 80 = 32.5 % Goal assessment  GAIT TRAINING: To normalize gait pattern and improve safety with FWW . 250 ft with FWW - repeated cues for RW proximity and upright posture Stairs: Level of Assistance: SBA and CGA Stair Negotiation Technique: Step to Pattern with Single Rail on Right Number of Stairs: 14  Height of Stairs: 7  Comments: step-to pattern leading with R LE - cues for increased L hip and knee flexion to ensure L toe clearance over edge of step to reduce tripping hazard   11/23/22 THERAPEUTIC EXERCISE: to improve flexibility, strength and mobility.  Verbal and tactile cues throughout for technique.  NuStep - L5 x 6 min Standing heel raise 2x10 Standing march x 10 bil bil suppport Standing hip abduction x 10 bil support Step ups 4' fwd x 10 bil with bil support Lat pulls seated 20# 2x10 Knee extension 15#    11/21/22 THERAPEUTIC EXERCISE: to improve flexibility, strength and mobility.  Verbal and tactile cues throughout for technique.  NuStep - L5 x 6 min Standing R/L GTB TKE with RW support for balance 2 x 10 each Seated RTB B hip ABD/ER clam 2 x 10 Seated B hip ADD ball squeeze + R/L hip IR 2 x 10 Seated RTB alt hip ABD step-out 2 x 10 Seated RTB hip flexion march 2 x 10 STS + RTB hip ABD isometric x 10  THERAPEUTIC ACTIVITIES: Re-instruction in donning L AFO ROM & MMT assessment   PATIENT EDUCATION:  Education details: HEP review,  transfer safety, and AFO management and skin monitoring   Person educated: Patient Education method: Explanation, Demonstration, and Verbal cues Education comprehension: verbalized understanding and verbal cues required  HOME EXERCISE PROGRAM: Access Code: [email or web address] URL: [email or web address] Date: 10/03/2022 Prepared by: Raynelle Fanning  Exercises - Supine  Heel Slide with Strap  - 2 x daily - 7 x weekly - 2 sets - 10 reps - 3 sec hold - Hooklying Single Knee to Chest Stretch  - 2 x daily - 7 x weekly - 3 reps - 30 sec hold - Seated Hamstring Curls with Resistance  - 1 x daily - 7 x weekly - 2 sets - 10 reps - 3 sec hold - Seated Knee Extension with Resistance  - 1 x daily - 7 x weekly - 2 sets - 10 reps - Standing March with Counter Support  - 1 x daily - 7 x weekly - 2 sets - 10 reps - Seated Hamstring Stretch  - 2 x daily - 7 x weekly - 3 reps - 30 sec hold - Standing ITB Stretch  - 2 x daily - 7 x weekly - 3 reps - 30 sec hold - Standing Hip Flexor Stretch  - 2 x daily - 7 x weekly - 2 sets - 10 reps - 30 sec hold - Seated Hip Internal Rotation AROM (Mirrored)  - 1 x daily - 7 x weekly - 3 sets - 10 reps   ASSESSMENT:  CLINICAL IMPRESSION:  Keidrick Plack" reports another fall this morning after reaching down to retrieve a dropped credit card when pulling his billfold out of his pocket. He continues to experience frequent falls as a result of ongoing LE weakness, especially L foot drop. Balance testing revealing variable results with improvement observed on TUG but decline noted on DGI. He demonstrates ongoing weakness, most notably proximally at hips and in L ankle. L foot drop causes him to have to substitute with increased hip and knee flexion for good foot clearance - gait better with L AFO but due to issues with his hands and overall flexibility he is unable to independently don the AFO. [name] would continue to benefit from skilled PT to address ongoing ROM, strength, balance and gait deficits as well as reduce his frequency of falls, however would like to hold off on further PT until after his pending back surgery - will place him on a 30-day hold and f/u in ~2 weeks to determine status of surgery schedule and need to resume PT or f/u post-operatively.    OBJECTIVE IMPAIRMENTS: Abnormal gait, decreased activity tolerance, decreased balance,  decreased coordination, decreased endurance, decreased knowledge of condition, decreased knowledge of use of DME, decreased mobility, difficulty walking, decreased ROM, decreased strength, decreased safety awareness, increased fascial restrictions, impaired perceived functional ability, impaired flexibility, impaired sensation, improper body mechanics, postural dysfunction, and pain.   ACTIVITY LIMITATIONS: carrying, lifting, bending, sitting, standing, squatting, sleeping, stairs, transfers, bed mobility, bathing, dressing, locomotion level, and caring for others  PARTICIPATION LIMITATIONS: meal prep, cleaning, laundry, driving, shopping, community activity, and yard work  PERSONAL FACTORS: Age, Fitness, Past/current experiences, Time since onset of injury/illness/exacerbation, and 3+ comorbidities: Aortic atherosclerosis; carotid stenosis; cervical myelopathy s/p ACDF 06/2020 and posterior cervical fusion 11/2020; peripheral neuropathy; lumbar DDD/spondylosis; lumbago with sciatica/lumbar radiculopathy; OA; B TKA; BTHA; L greater trochanteric pain syndrome; L RCR 2014; recent CTR surgery - early May; hearing loss; HTN; BPPV/vertigo; B sacroiliitis; skin cancer; COPD/emphysema; sleep apnea with CPAP; GERD; gout; HLD; LE cramps; RLS  are also affecting patient's functional outcome.   REHAB POTENTIAL: Good  CLINICAL DECISION MAKING: Evolving/moderate complexity  EVALUATION COMPLEXITY: Moderate   GOALS: Goals reviewed with patient? Yes  SHORT TERM GOALS: Target date: 10/03/2022   Patient will be independent with initial HEP. Baseline:  Goal status: MET  2.  Complete balance testing and establish additional LTGs as appropriate. Baseline:  Goal status: MET 09/07/22  3.  Patient will demonstrate decreased TUG time to </= 16 sec to decrease risk for falls with transitional mobility Baseline: 21.28 sec, 10/03/22 - 28.23 sec, 11/07/21 - 27.17 sec Goal status: NOT MET  11/30/22 - 20.79 sec with  FWW  LONG TERM GOALS: Target date: 10/31/2022, extended to 12/01/22   Patient will be independent with advanced/ongoing HEP to improve outcomes and carryover.  Baseline:  Goal status: PARTIALLY MET  11/30/22 - met for current HEP  2.  Patient will report at least 50-75% improvement in low back and L hip pain to improve QOL. Baseline: 7/10 Goal status: PARTIALLY MET  11/30/22 - no pain in L hip but back pain remains variable  3.  Patient will demonstrate improved L knee AROM to [facility] to allow for normal gait and stair mechanics. Baseline: AROM 0-92, PROM 0-95 Goal status: PARTIALLY MET  11/21/22 - L knee AROM 0-104  4.  Patient will demonstrate improved B LE strength to >/= 4 to 4+/5 for improved stability and ease of mobility. Baseline:  Goal status: PARTIALLY MET  11/21/22 - refer to above MMT table  5.  Patient will be able to ambulate 600' with LRAD and normal gait pattern, good stability and without increased pain to safely access community.  Baseline:  Goal status: NOT MET  6. Patient will be able to ascend/descend stairs with 1 HR and reciprocal step pattern safely to access home and community.  Baseline:  Goal status: NOT MET  11/30/22 - able to complete stairs with 1HR however he continues to rely on step-to pattern due to L LE weakness and L foot drop with cues necessary for increased L hip and knee flexion to allow for toe clearance over edge of step  7.  Patient will report >/= 28/80 on LEFS to demonstrate improved functional ability. Baseline: 19 / 80 = 23.8 % Goal status: PARTIALLY MET  11/30/22 - 26 / 80 = 32.5 %  8.  Patient will demonstrate at least 19/24 on DGI to decrease risk of falls. Baseline: 10/24 (09/07/22); 14/24 (11/14/22) Goal status: NOT MET  11/30/22 - 10/24  9.  Patient will improve Berg score by at least 8 points to improve safety and stability with ADLs in standing and reduce risk for falls Baseline: 38/56 (09/07/22) Goal status: NOT MET  11/14/22 -  37/56   PLAN:  PT FREQUENCY: 2x/week  PT DURATION: 6 weeks  PLANNED INTERVENTIONS: Therapeutic exercises, Therapeutic activity, Neuromuscular re-education, Balance training, Gait training, Patient/Family education, Self Care, Joint mobilization, Stair training, DME instructions, Dry Needling, Electrical stimulation, Cryotherapy, Moist heat, scar mobilization, Taping, Ultrasound, Ionotophoresis 4mg /ml Dexamethasone, Manual therapy, and Re-evaluation  PLAN FOR NEXT SESSION: 30-day hold   Marry Guan, PT 11/30/2022, 5:33 PM  PHYSICAL THERAPY DISCHARGE SUMMARY  Visits from Start of Care: 20  Current functional level related to goals / functional outcomes: See above.   Remaining deficits: See above   Education / Equipment: HEP   Patient agrees to discharge. Patient goals were partially met. Patient is being discharged due to the patient's request. Patient requested a 30  day hold for PT pending back surgery. This was performed on 12/27/22.   Solon Palm, PT 01/24/23 9:16 AM [facility] [address] [place], [place] [address] [phone]  Fax: [phone]

## 2022-11-30 NOTE — Progress Notes (Signed)
Virtual Visit via Telephone Note   Because of [name] Pratt's co-morbid illnesses, he is at least at moderate risk for complications without adequate follow up.  This format is felt to be most appropriate for this patient at this time.  The patient did not have access to video technology/had technical difficulties with video requiring transitioning to audio format only (telephone).  All issues noted in this document were discussed and addressed.  No physical exam could be performed with this format.  Please refer to the patient's chart for his consent to telehealth for [facility].  Evaluation Performed:  Preoperative cardiovascular risk assessment _____________   Date:  11/30/2022   Patient ID:  [name] Pratt, DOB 25-Mar-1941, MRN [id number] Patient Location:  Home Provider location:   Office  Primary Care Provider:  [name], NP Primary Cardiologist:  Jenkins Rouge, MD  Chief Complaint / Patient Profile   82 y.o. y/o male with a h/o aortic atherosclerosis, hypertension, hyperlipidemia, and osteoarthritis who is pending L2-3, L3-4 Laminectomy and Discectomy and presents today for telephonic preoperative cardiovascular risk assessment.  History of Present Illness    [name] Pratt is a 82 y.o. male who presents via audio/video conferencing for a telehealth visit today.  Pt was last seen in cardiology clinic on 06/28/22 by Diona Browner, NP.  At that time [name] Pratt was doing well.  The patient is now pending procedure as outlined above. Since his last visit, he tells me that he cannot walk 1-2 blocks at the moment.  He had a bad infection and lost 30 pounds in 1 month and lost some muscle mass at that time.  He has been trying to strengthen his legs since then.  He is very active and runs errands regularly.  He uses his walker when he is walking around the house.  He is still driving but it is difficult getting in and out of the car.  He loves to  swim but has not done much of that lately.  He has been sleeping well at night with his CPAP.   Regarding ASA therapy, if the surgeon feels that cessation of ASA is required in the perioperative period, it may be stopped 5-7 days prior to surgery with a plan to resume it as soon as felt to be feasible from a surgical standpoint in the post-operative period.   Reports no shortness of breath nor dyspnea on exertion. Reports no chest pain, pressure, or tightness. No edema, orthopnea, PND. Reports no palpitations.    Past Medical History    Past Medical History:  Diagnosis Date   Allergic rhinitis 02/02/2016   Anemia 12/17/2009   Formatting of this note might be different from the original. Anemia  10/1 IMO update   Aortic atherosclerosis ([place]) 03/05/2020   Arthritis    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   Atypical chest pain 11/24/2015   Benign essential hypertension 05/13/2009   Qualifier: Diagnosis of  By: Larwance Sachs of this note might be different from the original. Hypertension   Benign paroxysmal positional vertigo 09/14/2021   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[place]) 07/22/2021   Body mass index (BMI) 25.0-25.9, adult 11/02/2020   Cancer ([place])    skin cancer   Cardiac murmur 07/25/2021   Carotid stenosis    a. Carotid U/S [id number]: LICA < [id number], RICA 0000000;  b.  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   Cystoid macular edema of both eyes 07/28/2020   Degenerative disc disease, lumbar 06/30/2015   Deviated nasal septum 02/18/2015   Diverticulosis 03/03/2020   DJD (degenerative joint disease) of hip  12/24/2012   Dyspnea 06/12/2011   Emphysema, unspecified ([place]) 03/05/2020   Erectile dysfunction 06/20/2017   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[place]) 02/09/2021   Gait disorder 03/02/2021   GERD (gastroesophageal reflux disease)    GOUT 05/13/2009   Qualifier: Diagnosis of  By: Burnett Kanaris     Greater trochanteric pain syndrome 07/22/2021   Hand pain, left 01/16/2022   Hearing loss 02/02/2016   Heme positive stool 02/29/2020   HIATAL HERNIA 05/13/2009   Qualifier: Diagnosis of  By: Burnett Kanaris     High risk medication use 01/18/2012   Hip pain 03/02/2021   History of chicken pox    History of COVID-19 12/05/2021   History of hemorrhoids    History of kidney stones    History of skin cancer 07/22/2021   skin cancer   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 Cardiology - Dr Frances Nickels at [facility] cardiology  ICD-10 cut over   Hypertension    under control   Hypokalemia 07/08/2021   Hypothyroidism    Internal hemorrhoids 03/03/2020   Leg cramps 11/13/2019   Lower GI bleed 12/12/2012   Lumbago of lumbar region with sciatica 10/21/2020   Lumbar radiculopathy 03/02/2021   Lumbar spondylosis 11/02/2020   NEPHROLITHIASIS 05/13/2009   Qualifier: Diagnosis of  By: Burnett Kanaris     Nonspecific abnormal electrocardiogram (ECG) (EKG) 01/06/2013   Numbness of hand 01/24/2022   Onychomycosis 06/30/2015   Osteoarthrosis, 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 Osteoarthritis Of The Hand  10/1 IMO update   Osteoarthrosis, unspecified whether generalized or localized, pelvic region and thigh 07/25/2010   Formatting of this note might be different from the original. Osteoarthritis Of The Hip   Other abnormal glucose  07/22/2021   Other allergic rhinitis 02/18/2015   Palpitations 07/25/2021   Peripheral neuropathy 06/19/2016   Preventative health care 11/24/2015   Right groin pain 11/29/2012   Right inguinal hernia 05/13/2009   Qualifier: Diagnosis of  By: Burnett Kanaris     RLS (restless legs syndrome) 02/18/2015   Rosacea    Rotator cuff tear 07/27/2013   Situational depression 03/02/2021   Skin lesion 01/16/2022   Sleep apnea with use of continuous positive airway pressure (CPAP) 07/15/2013   CPAP set to  7 cm water,  Residual AHi was 7.8  With no leak.  User time 6 hours.  479 days , not used only 12 days - highly compliant 06-23-13  .    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   Urinary retention 11/26/2020   Ventral hernia 07/08/2012   Vertigo 07/08/2021   Weight loss 03/03/2020   Past Surgical History:  Procedure Laterality Date   ABDOMINAL HERNIA REPAIR  07/15/12   Dr [name]   ANTERIOR CERVICAL DECOMP/DISCECTOMY FUSION N/A 07/09/2020   Procedure: ANTERIOR CERVICAL DECOMPRESSION AND FUSION CERVICAL THREE-FOUR.;  Surgeon: [name], MD;  Location: [place];  Service: Neurosurgery;  Laterality: N/A;  anterior   BROW LIFT  05/07/01   COLONOSCOPY WITH PROPOFOL N/A 04/24/2022   Procedure: COLONOSCOPY WITH PROPOFOL;  Surgeon: Thornton Park, MD;  Location: [place];  Service: Gastroenterology;  Laterality: N/A;   COLONOSCOPY WITH PROPOFOL N/A 04/26/2022   Procedure: COLONOSCOPY WITH PROPOFOL;  Surgeon: Thornton Park, MD;  Location: [place];  Service: Gastroenterology;  Laterality: N/A;   CYSTOSCOPY WITH URETEROSCOPY AND STENT PLACEMENT Right 01/28/2014    Procedure: CYSTOSCOPY WITH URETEROSCOPY, BASKET RETRIVAL AND  STENT PLACEMENT;  Surgeon: [name], MD;  Location: [facility];  Service: Urology;  Laterality: Right;   epidural injections     multiple procedures   ESOPHAGEAL DILATION  1993 and 1994   multiple times   EYE SURGERY Bilateral 03/25/10, 2012   cataract removal   HEMORRHOID SURGERY  2014   HIATAL HERNIA REPAIR  12/21/93   HOLMIUM LASER APPLICATION Right [id number]   Procedure: HOLMIUM LASER APPLICATION;  Surgeon: [name], MD;  Location: [facility];  Service: Urology;  Laterality: Right;   INGUINAL HERNIA REPAIR Right 07/15/12   Dr [name], x2   JOINT REPLACEMENT  07/25/04   right knee   JOINT REPLACEMENT  07/13/10   left knee   [place]  09/20/06   with intraoperative cholangiogram and right inguinal herniorrhaphy with mesh    LIGAMENT REPAIR Left 07/2013   shoulder   LITHOTRIPSY     x2   [place]   POLYPECTOMY  04/26/2022   Procedure: POLYPECTOMY;  Surgeon: Thornton Park, MD;  Location: [place];  Service: Gastroenterology;;   POSTERIOR CERVICAL FUSION/FORAMINOTOMY N/A 11/24/2020   Procedure: Posterior Cervical Laminectomy Cervical three-four, Cervical four-five with lateral mass fusion;  Surgeon: [name], MD;  Location: [place];  Service: Neurosurgery;  Laterality: N/A;   REFRACTIVE SURGERY Bilateral    Right total hip replacement  4/14   SKIN CANCER DESTRUCTION     nose, ear   TONSILLECTOMY  as child   TOTAL HIP ARTHROPLASTY Left    URETHRAL DILATION  1991   [name]    Allergies  Allergies  Allergen Reactions   Pravastatin Other (See Comments)    Muscle cramps   Sildenafil Other (See Comments)    Tachycardia     Oxycodone Other (See Comments)    Hallucinations    Atenolol Other (See Comments)    Bradycardia    Elastic Bandages & [Zinc] Other (See Comments)    Teflon bandages - Irritation   Metoclopramide Hcl Anxiety    Home  Medications    Prior to Admission medications   Medication Sig Start Date End Date Taking? Authorizing Provider  acetaminophen (TYLENOL) 650 MG CR tablet Take 650-1,300 mg by mouth every 8 (eight) hours as needed for pain.    [provider]  albuterol (VENTOLIN HFA) 108 (90 Base) MCG/ACT inhaler INHALE 2 PUFFS INTO THE LUNGS EVERY 6 HOURS AS NEEDED FOR SHORTNESS OF BREATH. Patient taking differently: Inhale 2 puffs into the lungs  every 6 (six) hours as needed for shortness of breath. 10/20/21   [name], NP  allopurinol (ZYLOPRIM) 100 MG tablet Take 1 tablet (100 mg total) by mouth 2 (two) times daily. 08/07/22   [name], NP  amLODipine (NORVASC) 5 MG tablet Take 1.5 tablets (7.5 mg total) by mouth daily. 09/22/22   [name], NP  antiseptic oral rinse (BIOTENE) LIQD 15 mLs by Mouth Rinse route in the morning.    [provider]  aspirin EC 81 MG tablet Take 81 mg by mouth at bedtime.    [provider]  bacitracin ointment Apply 1 Application topically 2 (two) times daily. 09/15/22   [name], [name], DO  cycloSPORINE (RESTASIS) 0.05 % ophthalmic emulsion Place 1 drop into both eyes 2 (two) times daily.    [provider]  diclofenac Sodium (VOLTAREN) 1 % GEL Apply 2 g topically daily as needed (for arthritic pain in the hands).      docusate sodium (COLACE) 100 MG capsule Take 1 capsule (100 mg total) by mouth 2 (two) times daily. 04/27/22 04/27/23  Barb Merino, MD  doxycycline (VIBRAMYCIN) 100 MG capsule Take 100 mg by mouth 2 (two) times daily. 10/19/22   [provider]  gabapentin (NEURONTIN) 300 MG capsule Take 1 capsule by mouth at bedtime. 12/02/19   [provider]  GEMTESA 75 MG TABS Take 75 mg by mouth daily. 10/20/21   [provider]  glucose blood test strip TRUE Metrix Blood Glucose Test Strip, use as instructed 11/12/20   [name], NP  Homeopathic Products ([place]) FOAM Apply 1  application topically 2 (two) times daily as needed (Pain).    [provider]  hydrALAZINE (APRESOLINE) 50 MG tablet Take 1 tablet (50 mg total) by mouth 2 (two) times daily. 08/07/22   [name], NP  Iron, Ferrous Sulfate, 325 (65 Fe) MG TABS Take 325 mg by mouth every other day. 08/10/22   [name], NP  Lancets MISC 1 each by Does not apply route 2 (two) times daily. TRUE matrix lancets 11/12/20   [name], NP  levothyroxine (SYNTHROID) 150 MCG tablet Take 150 mcg 6 days a week (patient takes 75 mcg once a week) Disp #84 for 90 day supply 08/07/22   [name], NP  levothyroxine (SYNTHROID) 75 MCG tablet Take 75 mcg once a week (patient on 150 mcg 6 days a week) Dispense #24 for 90 day supply. 08/07/22   [name], NP  loratadine (CLARITIN) 10 MG tablet Take 10 mg by mouth daily as needed for allergies or rhinitis.    [provider]  losartan (COZAAR) 100 MG tablet TAKE 1 TABLET EVERY DAY 08/10/22   [name], NP  Magnesium 500 MG TABS Take 500 mg by mouth daily.    [provider]  Multiple Vitamins-Minerals (MULTIVITAMIN WITH MINERALS) tablet Take 1 tablet by mouth daily.    [provider]  nitroGLYCERIN (NITROSTAT) 0.4 MG SL tablet Place 1 tablet (0.4 mg total) under the tongue every 5 (five) minutes as needed for chest pain. 09/16/21   [name], MD  polyethylene glycol (MIRALAX / GLYCOLAX) 17 g packet Take 17-34 g by mouth daily as needed for mild constipation.    [provider]  polyvinyl alcohol (ARTIFICIAL TEARS) 1.4 % ophthalmic solution Place 1 drop into both eyes as needed for dry eyes.    [provider]  PRESCRIPTION MEDICATION CPAP- At bedtime and during any time of rest  [provider]  Probiotic Product (PROBIOTIC DAILY PO) Take 1 capsule by mouth daily.    [provider]  rosuvastatin (CRESTOR) 5 MG tablet Take 1 tablet (5 mg total) by  mouth daily. 08/07/22   [name], NP  sodium chloride (OCEAN) 0.65 % nasal spray Place 1 spray into both nostrils as needed for congestion.    [provider]  traMADol (ULTRAM) 50 MG tablet Take 1 tablet (50 mg total) by mouth every 12 (twelve) hours as needed. 01/16/22   [name], NP  vitamin B-12 (CYANOCOBALAMIN) 500 MCG tablet Take 500 mcg by mouth daily.    [provider]    Physical Exam    Vital Signs:  [name] Pratt does not have vital signs available for review today.  128/60  Given telephonic nature of communication, physical exam is limited. AAOx3. NAD. Normal affect.  Speech and respirations are unlabored.  Accessory Clinical Findings    None  Assessment & Plan    1.  Preoperative Cardiovascular Risk Assessment:  [name] Pratt perioperative risk of a major cardiac event is 0.9% according to the Revised Cardiac Risk Index (RCRI).  Therefore, he is at low risk for perioperative complications.   His functional capacity is fair at 4.73 METs according to the [place] Activity Status Index (DASI). Recommendations: According to ACC/AHA guidelines, no further cardiovascular testing needed.  The patient may proceed to surgery at acceptable risk.   Antiplatelet and/or Anticoagulation Recommendations: Aspirin can be held for 5-7 days prior to his surgery.  Please resume Aspirin post operatively when it is felt to be safe from a bleeding standpoint.   A copy of this note will be routed to requesting surgeon.  Time:   Today, I have spent 25 minutes with the patient with telehealth technology discussing medical history, symptoms, and management plan.     Elgie Collard, PA-C  11/30/2022, 9:22 AM

## 2022-12-02 ENCOUNTER — Other Ambulatory Visit: Payer: Self-pay | Admitting: Family

## 2022-12-02 DIAGNOSIS — E039 Hypothyroidism, unspecified: Secondary | ICD-10-CM

## 2022-12-03 ENCOUNTER — Other Ambulatory Visit: Payer: Self-pay

## 2022-12-03 ENCOUNTER — Emergency Department (HOSPITAL_BASED_OUTPATIENT_CLINIC_OR_DEPARTMENT_OTHER): Payer: Medicare HMO

## 2022-12-03 ENCOUNTER — Emergency Department (HOSPITAL_BASED_OUTPATIENT_CLINIC_OR_DEPARTMENT_OTHER)
Admission: EM | Admit: 2022-12-03 | Discharge: 2022-12-03 | Disposition: A | Discharge status: Home / Self Care | Payer: Medicare HMO | Attending: Emergency Medicine | Admitting: Emergency Medicine

## 2022-12-03 ENCOUNTER — Encounter (HOSPITAL_BASED_OUTPATIENT_CLINIC_OR_DEPARTMENT_OTHER): Payer: Self-pay | Admitting: Urology

## 2022-12-03 DIAGNOSIS — S3991XA Unspecified injury of abdomen, initial encounter: Secondary | ICD-10-CM | POA: Diagnosis not present

## 2022-12-03 DIAGNOSIS — R109 Unspecified abdominal pain: Secondary | ICD-10-CM | POA: Diagnosis not present

## 2022-12-03 DIAGNOSIS — M25551 Pain in right hip: Secondary | ICD-10-CM | POA: Diagnosis not present

## 2022-12-03 DIAGNOSIS — Z043 Encounter for examination and observation following other accident: Secondary | ICD-10-CM | POA: Diagnosis not present

## 2022-12-03 DIAGNOSIS — W19XXXA Unspecified fall, initial encounter: Secondary | ICD-10-CM

## 2022-12-03 DIAGNOSIS — Z7982 Long term (current) use of aspirin: Secondary | ICD-10-CM | POA: Insufficient documentation

## 2022-12-03 DIAGNOSIS — W1830XA Fall on same level, unspecified, initial encounter: Secondary | ICD-10-CM | POA: Insufficient documentation

## 2022-12-03 DIAGNOSIS — S3993XA Unspecified injury of pelvis, initial encounter: Secondary | ICD-10-CM | POA: Diagnosis not present

## 2022-12-03 DIAGNOSIS — R0781 Pleurodynia: Secondary | ICD-10-CM | POA: Diagnosis not present

## 2022-12-03 LAB — COMPREHENSIVE METABOLIC PANEL
ALT: 20 U/L (ref 0–44)
AST: 32 U/L (ref 15–41)
Albumin: 4 g/dL (ref 3.5–5.0)
Alkaline Phosphatase: 108 U/L (ref 38–126)
Anion gap: 9 (ref 5–15)
BUN: 37 mg/dL — ABNORMAL HIGH (ref 8–23)
CO2: 27 mmol/L (ref 22–32)
Calcium: 8.5 mg/dL — ABNORMAL LOW (ref 8.9–10.3)
Chloride: 101 mmol/L (ref 98–111)
Creatinine, Ser: 1.52 mg/dL — ABNORMAL HIGH (ref 0.61–1.24)
GFR, Estimated: 45 mL/min — ABNORMAL LOW (ref >60)
Glucose, Bld: 118 mg/dL — ABNORMAL HIGH (ref 70–99)
Potassium: 3.6 mmol/L (ref 3.5–5.1)
Sodium: 137 mmol/L (ref 135–145)
Total Bilirubin: 0.6 mg/dL (ref 0.3–1.2)
Total Protein: 7.7 g/dL (ref 6.5–8.1)

## 2022-12-03 LAB — CBC WITH DIFFERENTIAL/PLATELET
Abs Immature Granulocytes: 0.03 10*3/uL (ref 0.00–0.07)
Basophils Absolute: 0 10*3/uL (ref 0.0–0.1)
Basophils Relative: 0 %
Eosinophils Absolute: 0.2 10*3/uL (ref 0.0–0.5)
Eosinophils Relative: 2 %
HCT: 35.1 % — ABNORMAL LOW (ref 39.0–52.0)
Hemoglobin: 11.3 g/dL — ABNORMAL LOW (ref 13.0–17.0)
Immature Granulocytes: 0 %
Lymphocytes Relative: 18 %
Lymphs Abs: 1.7 10*3/uL (ref 0.7–4.0)
MCH: 31 pg (ref 26.0–34.0)
MCHC: 32.2 g/dL (ref 30.0–36.0)
MCV: 96.2 fL (ref 80.0–100.0)
Monocytes Absolute: 0.8 10*3/uL (ref 0.1–1.0)
Monocytes Relative: 8 %
Neutro Abs: 6.6 10*3/uL (ref 1.7–7.7)
Neutrophils Relative %: 72 %
Platelets: 364 10*3/uL (ref 150–400)
RBC: 3.65 MIL/uL — ABNORMAL LOW (ref 4.22–5.81)
RDW: 14.6 % (ref 11.5–15.5)
WBC: 9.2 10*3/uL (ref 4.0–10.5)
nRBC: 0 % (ref 0.0–0.2)

## 2022-12-03 LAB — URINALYSIS, ROUTINE W REFLEX MICROSCOPIC
Bilirubin Urine: NEGATIVE
Glucose, UA: NEGATIVE mg/dL
Hgb urine dipstick: NEGATIVE
Ketones, ur: NEGATIVE mg/dL
Leukocytes,Ua: NEGATIVE
Nitrite: NEGATIVE
Protein, ur: 30 mg/dL — AB
Specific Gravity, Urine: 1.02 (ref 1.005–1.030)
pH: 6 (ref 5.0–8.0)

## 2022-12-03 LAB — URINALYSIS, MICROSCOPIC (REFLEX)

## 2022-12-03 MED ORDER — LIDOCAINE 5 % EX PTCH
2.0000 | MEDICATED_PATCH | CUTANEOUS | Status: DC
  Administered 2022-12-03: 2 via TRANSDERMAL
  Filled 2022-12-03: qty 2

## 2022-12-03 MED ORDER — NAPROXEN 250 MG PO TABS
500.0000 mg | ORAL_TABLET | Freq: Once | ORAL | Status: AC
  Administered 2022-12-03: 500 mg via ORAL
  Filled 2022-12-03: qty 2

## 2022-12-03 MED ORDER — ACETAMINOPHEN 325 MG PO TABS
650.0000 mg | ORAL_TABLET | Freq: Once | ORAL | Status: AC
  Administered 2022-12-03: 650 mg via ORAL
  Filled 2022-12-03: qty 2

## 2022-12-03 MED ORDER — IOHEXOL 300 MG/ML  SOLN
75.0000 mL | Freq: Once | INTRAMUSCULAR | Status: AC | PRN
  Administered 2022-12-03: 75 mL via INTRAVENOUS

## 2022-12-03 MED ORDER — HYDROCODONE-ACETAMINOPHEN 5-325 MG PO TABS
1.0000 | ORAL_TABLET | Freq: Once | ORAL | Status: DC
  Filled 2022-12-03: qty 1

## 2022-12-03 NOTE — Discharge Instructions (Signed)
Please follow-up with your primary care doctor and your neurologist/neurosurgeon.  If you fall again, hit your head, have worsening pain, worsening weakness, please return to the ER.  You can take Tylenol and ibuprofen for your pain control as wares as well as use lidocaine patches.

## 2022-12-03 NOTE — ED Triage Notes (Signed)
Pt states fall x 2 this week  States right hip pain from first fall [place] again this am and hit of left side of ribs   Denies any head injury or LOC with falls  A&O x 4, GCS 15  No blood thinners

## 2022-12-03 NOTE — ED Provider Notes (Signed)
[facility] Provider Note   CSN: [id number] Arrival date & time: 12/03/22  1418     History  Chief Complaint  Patient presents with   [name] Pratt    [name] Pratt is a 82 y.o. male, hx of gait instability, lumbar radiculopathy, who presents to the ED 2/2 to 2 falls in the last week.  He states that about a week ago, he felt weak in his legs, and then fell onto his buttocks onto the ground.  States he hurt his right hip, did not hit his head, did not hit his neck.  States that it initially, went away, but then he had some pain in his buttocks, that has persisted.  Then today again he was putting some things up in the closet, and he fell again.  States this time he fell onto his bottom once again, and then hit the left side of his body.  He notes that it is extremely tender to the left side of his body, and he has noticed some bruising to his left flank.  Denies any chest pain, shortness of breath.  States that this is a frequent thing for him and he has multiple falls.  Notes he is supposed to have surgery for his back, which often causes his falls.  No new loss of bowel or bladder.  No use of blood thinners.     Home Medications Prior to Admission medications   Medication Sig Start Date End Date Taking? Authorizing Provider  acetaminophen (TYLENOL) 650 MG CR tablet Take 650-1,300 mg by mouth every 8 (eight) hours as needed for pain.    [provider]  albuterol (VENTOLIN HFA) 108 (90 Base) MCG/ACT inhaler INHALE 2 PUFFS INTO THE LUNGS EVERY 6 HOURS AS NEEDED FOR SHORTNESS OF BREATH. Patient taking differently: Inhale 2 puffs into the lungs every 6 (six) hours as needed for shortness of breath. 10/20/21   [name], NP  allopurinol (ZYLOPRIM) 100 MG tablet Take 1 tablet (100 mg total) by mouth 2 (two) times daily. 08/07/22   [name], NP  amLODipine (NORVASC) 5 MG tablet Take 1.5 tablets (7.5 mg total) by mouth daily.  09/22/22   [name], NP  antiseptic oral rinse (BIOTENE) LIQD 15 mLs by Mouth Rinse route in the morning.    [provider]  aspirin EC 81 MG tablet Take 81 mg by mouth at bedtime.    [provider]  bacitracin ointment Apply 1 Application topically 2 (two) times daily. 09/15/22   [name], [name], DO  cycloSPORINE (RESTASIS) 0.05 % ophthalmic emulsion Place 1 drop into both eyes 2 (two) times daily.    [provider]  diclofenac Sodium (VOLTAREN) 1 % GEL Apply 2 g topically daily as needed (for arthritic pain in the hands).      docusate sodium (COLACE) 100 MG capsule Take 1 capsule (100 mg total) by mouth 2 (two) times daily. 04/27/22 04/27/23  Barb Merino, MD  doxycycline (VIBRAMYCIN) 100 MG capsule Take 100 mg by mouth 2 (two) times daily. 10/19/22   [provider]  gabapentin (NEURONTIN) 300 MG capsule Take 1 capsule by mouth at bedtime. 12/02/19   [provider]  GEMTESA 75 MG TABS Take 75 mg by mouth daily. 10/20/21   [provider]  glucose blood test strip TRUE Metrix Blood Glucose Test Strip, use as instructed 11/12/20   [name], NP  Homeopathic Products ([place]) FOAM Apply 1 application topically 2 (two)  times daily as needed (Pain).    [provider]  hydrALAZINE (APRESOLINE) 50 MG tablet Take 1 tablet (50 mg total) by mouth 2 (two) times daily. 08/07/22   [name], NP  Iron, Ferrous Sulfate, 325 (65 Fe) MG TABS Take 325 mg by mouth every other day. 08/10/22   [name], NP  Lancets MISC 1 each by Does not apply route 2 (two) times daily. TRUE matrix lancets 11/12/20   [name], NP  levothyroxine (SYNTHROID) 150 MCG tablet Take 150 mcg 6 days a week (patient takes 75 mcg once a week) Disp #84 for 90 day supply 08/07/22   [name], NP  levothyroxine (SYNTHROID) 75 MCG tablet Take 75 mcg once a week (patient on 150 mcg 6 days a week) Dispense #24 for 90 day  supply. 08/07/22   [name], NP  loratadine (CLARITIN) 10 MG tablet Take 10 mg by mouth daily as needed for allergies or rhinitis.    [provider]  losartan (COZAAR) 100 MG tablet TAKE 1 TABLET EVERY DAY 08/10/22   [name], NP  Magnesium 500 MG TABS Take 500 mg by mouth daily.    [provider]  Multiple Vitamins-Minerals (MULTIVITAMIN WITH MINERALS) tablet Take 1 tablet by mouth daily.    [provider]  nitroGLYCERIN (NITROSTAT) 0.4 MG SL tablet Place 1 tablet (0.4 mg total) under the tongue every 5 (five) minutes as needed for chest pain. 09/16/21   [name], MD  polyethylene glycol (MIRALAX / GLYCOLAX) 17 g packet Take 17-34 g by mouth daily as needed for mild constipation.    [provider]  polyvinyl alcohol (ARTIFICIAL TEARS) 1.4 % ophthalmic solution Place 1 drop into both eyes as needed for dry eyes.    [provider]  PRESCRIPTION MEDICATION CPAP- At bedtime and during any time of rest    [provider]  Probiotic Product (PROBIOTIC DAILY PO) Take 1 capsule by mouth daily.    [provider]  rosuvastatin (CRESTOR) 5 MG tablet Take 1 tablet (5 mg total) by mouth daily. 08/07/22   [name], NP  sodium chloride (OCEAN) 0.65 % nasal spray Place 1 spray into both nostrils as needed for congestion.    [provider]  traMADol (ULTRAM) 50 MG tablet Take 1 tablet (50 mg total) by mouth every 12 (twelve) hours as needed. 01/16/22   [name], NP  vitamin B-12 (CYANOCOBALAMIN) 500 MCG tablet Take 500 mcg by mouth daily.    [provider]      Allergies    Pravastatin, Sildenafil, Oxycodone, Atenolol, Elastic bandages & [zinc], and Metoclopramide hcl    Review of Systems   Review of Systems  Genitourinary:  Positive for flank pain.  Musculoskeletal:  Negative for neck pain.    Physical Exam Updated Vital Signs BP (!) 176/138   Pulse 79   Temp 97.6 F  (36.4 C) (Oral)   Resp 13   Ht 5\' 7"  (1.702 m)   Wt 72 kg   SpO2 96%   BMI 24.86 kg/m  Physical Exam Vitals and nursing note reviewed.  Constitutional:      General: He is not in acute distress.    Appearance: He is well-developed.  HENT:     Head: Normocephalic and atraumatic.  Eyes:     Conjunctiva/sclera: Conjunctivae normal.  Cardiovascular:     Rate and Rhythm: Normal rate and regular rhythm.     Heart sounds: No murmur heard. Pulmonary:  Effort: Pulmonary effort is normal. No respiratory distress.     Breath sounds: Normal breath sounds.  Abdominal:     Palpations: Abdomen is soft.     Tenderness: There is no abdominal tenderness.  Musculoskeletal:        General: No swelling.     Cervical back: Neck supple.     Comments: 5 out of 5 strength of bilateral lower extremities.  Tenderness to palpation of left chest wall, without ecchymosis, crepitus, edema.  Does have some bruising to the left flank, and tenderness to palpation of the left paraspinal muscles.  Tenderness to palpation of the right hip as well, with worsening pain with range of motion.  No femur or tibia-fibula pain.  Skin:    General: Skin is warm and dry.     Capillary Refill: Capillary refill takes less than 2 seconds.  Neurological:     Mental Status: He is alert.  Psychiatric:        Mood and Affect: Mood normal.     ED Results / Procedures / Treatments   Labs (all labs ordered are listed, but only abnormal results are displayed) Labs Reviewed  COMPREHENSIVE METABOLIC PANEL - Abnormal; Notable for the following components:      Result Value   Glucose, Bld 118 (*)    BUN 37 (*)    Creatinine, Ser 1.52 (*)    Calcium 8.5 (*)    GFR, Estimated 45 (*)    All other components within normal limits  CBC WITH DIFFERENTIAL/PLATELET - Abnormal; Notable for the following components:   RBC 3.65 (*)    Hemoglobin 11.3 (*)    HCT 35.1 (*)    All other components within normal limits  URINALYSIS,  ROUTINE W REFLEX MICROSCOPIC - Abnormal; Notable for the following components:   Protein, ur 30 (*)    All other components within normal limits  URINALYSIS, MICROSCOPIC (REFLEX) - Abnormal; Notable for the following components:   Bacteria, UA FEW (*)    All other components within normal limits    EKG EKG Interpretation  Date/Time:  Sunday December 03 2022 16:32:27 EDT Ventricular Rate:  63 PR Interval:  222 QRS Duration: 155 QT Interval:  487 QTC Calculation: 499 R Axis:   -66 Text Interpretation: Sinus rhythm Prolonged PR interval Left atrial enlargement 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 EXAM: CT ABDOMEN AND PELVIS WITH CONTRAST TECHNIQUE: Multidetector CT imaging of the abdomen and pelvis was performed using the standard protocol following bolus administration of intravenous contrast. RADIATION DOSE REDUCTION: This exam was performed according to the departmental dose-optimization program which includes automated exposure control, adjustment of the mA and/or kV according to patient size and/or use of iterative reconstruction technique. CONTRAST:  50mL OMNIPAQUE IOHEXOL 300 MG/ML  SOLN COMPARISON:  CT abdomen pelvis dated 10/16/2022. FINDINGS: Evaluation of this exam is limited due to respiratory motion artifact. Evaluation is also limited due to streak artifact caused by bilateral total hip arthroplasties. Lower chest: The visualized lung bases are clear. No intra-abdominal free air or free fluid. Hepatobiliary: The liver is unremarkable. No biliary dilatation. Cholecystectomy. Pancreas: Unremarkable. No pancreatic ductal dilatation or surrounding inflammatory changes. Spleen: Normal in size without focal  abnormality. Adrenals/Urinary Tract: The adrenal glands are unremarkable. There  is a 1 cm nonobstructing right renal inferior pole calculus. No hydronephrosis. Ishani Goldwasser right renal upper pole cyst. The left kidney is unremarkable. There is symmetric enhancement and excretion of contrast by both kidneys. The visualized ureters and urinary bladder appear unremarkable. Stomach/Bowel: Severe sigmoid diverticulosis without active inflammatory changes. There is large amount of stool throughout the colon. There is no bowel obstruction or active inflammation. There is a Dionis Autry hiatal hernia. The appendix is normal. Vascular/Lymphatic: Advanced aortoiliac atherosclerotic disease. The IVC is unremarkable. No portal venous gas. There is no adenopathy. Reproductive: The prostate and seminal vesicles are grossly unremarkable. No pelvic mass. Other: Emarion Toral fat containing ventral hernia to the right of the midline. No bowel herniation or fluid collection. Musculoskeletal: Osteopenia with degenerative changes of the spine. 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 No acute osseous pathology. IMPRESSION: 1. No acute intra-abdominal or pelvic pathology. 2. A 1 cm nonobstructing right renal inferior pole calculus. No hydronephrosis. 3. Severe sigmoid diverticulosis. No bowel obstruction. Normal appendix. 4.  Aortic Atherosclerosis (ICD10-I70.0). Electronically Signed   By: Anner Crete M.D.   On: 12/03/2022 16:59   DG Ribs Unilateral W/Chest Left  Result Date: 12/03/2022 CLINICAL DATA:  Fall EXAM: LEFT RIBS AND CHEST - 5 VIEW COMPARISON:  07/01/2022 chest radiograph FINDINGS: No in displaced fracture or other bone lesions are seen involving the ribs. There is no evidence of pneumothorax or pleural effusion. Both lungs are clear. Heart size and mediastinal contours are within normal limits. Partially imaged cervical fusion hardware. Prior left rotator cuff repair. IMPRESSION: Negative. Electronically Signed    By: Merilyn Baba M.D.   On: 12/03/2022 16:57   DG Hip Unilat W or Wo Pelvis 2-3 Views Right  Result Date: 12/03/2022 CLINICAL DATA:  Fall EXAM: DG HIP (WITH PELVIS) 3V RIGHT COMPARISON:  10/31/2021 FINDINGS: Status post bilateral total hip arthroplasties, with interval placement of left femur cerclage wires. There is no evidence of hip fracture or dislocation. Degenerative changes in the imaged lumbar spine and sacrum. Evaluation the sacrum is somewhat limited by overlying bowel gas. IMPRESSION: Status post bilateral total hip arthroplasties without evidence of fracture or dislocation. No acute osseous abnormality. Electronically Signed   By: Merilyn Baba M.D.   On: 12/03/2022 16:53    Procedures Procedures    Medications Ordered in ED Medications  lidocaine (LIDODERM) 5 % 2 patch (2 patches Transdermal Patch Applied 12/03/22 1801)  acetaminophen (TYLENOL) tablet 650 mg (650 mg Oral Given 12/03/22 1644)  iohexol (OMNIPAQUE) 300 MG/ML solution 75 mL (75 mLs Intravenous Contrast Given 12/03/22 1608)  naproxen (NAPROSYN) tablet 500 mg (500 mg Oral Given 12/03/22 1759)    ED Course/ Medical Decision Making/ A&P                         Medical Decision Making Patient is an 82 year old male, here for 2 falls in the past week.  States he has been having more frequent falls, but this is a consistent issue.  States he still has no back surgery.  Denies any worsening weakness.  Has ecchymosis to the left flank, tenderness to palpation of the left rib cage, as well as tenderness to palpation of the right hip.  Will obtain x-rays, CT abdomen pelvis secondary to left flank pain, and to rule out any intra-abdominal bleeding.  Given that he is also had multiple falls, we will obtain a CBC, CMP, urinalysis and EKG for further evaluation.  He denies any other  pain at this time. No use of blood thinners.  Amount and/or Complexity of Data Reviewed Labs: ordered.    Details: Unchanged from previous Radiology:  ordered.    Details: No acute findings Discussion of management or test interpretation with external provider(s): Discussed with patient, pain likely secondary to bruising,/muscle strain.  Use Tylenol ibuprofen and lidocaine patches.  Able to ambulate well.  Nursing was alerted provider that patient was feeling different, I evaluated him at bedside, and on exam he states that he has just been sitting for very long time, and states that his back hurts more when he sits, he has chronic back issues.  He has good follow-up with his neurosurgeon/orthopedic surgeon, and will follow-up with them outpatient.  He has no new weakness, and has 5 out of 5 strength, so discharged home with strict return precautions.  Risk OTC drugs. Prescription drug management.   Final Clinical Impression(s) / ED Diagnoses Final diagnoses:  Fall, initial encounter  Rib pain on left side  Left flank pain  Right hip pain    Rx / DC Orders ED Discharge Orders     None         Osvaldo Shipper, PA 12/03/22 1839    Margette Fast, MD 12/08/22 1538

## 2022-12-03 NOTE — ED Notes (Signed)
Pt ambulation test passed, per PA request

## 2022-12-03 NOTE — ED Notes (Signed)
Pt states pain is worse and feels different than before. Pt states it radiates to the side opposite of the direction he moves with each movement. Pt requesting pain management but denies any medication that makes him feel "drowsy or foggy." PA notified.

## 2022-12-04 ENCOUNTER — Telehealth: Payer: Self-pay | Admitting: Family

## 2022-12-04 MED ORDER — AMLODIPINE BESYLATE 5 MG PO TABS: 7.5000 mg | ORAL_TABLET | Freq: Every day | ORAL | 1 refills | Status: DC

## 2022-12-04 NOTE — Telephone Encounter (Signed)
Patient was given pain medication yesterday at ed. He will like to get some more. He was not able to provide name of medication, it is listed on his ed visit that he was given naproxen 500 mg.  Patient will like to know if it is ok to continue this.  Please advise

## 2022-12-04 NOTE — Telephone Encounter (Signed)
I would not recommend that he use ibuprofen or naproxen due to his decreased kidney function. I would recommend tylenol and lidocaine patches as needed.  If he needs a temporary stronger medication, I am permitted to provide a 5 day course only and this requires a face to face office visit first.

## 2022-12-04 NOTE — Telephone Encounter (Signed)
Rx sent to [facility].

## 2022-12-04 NOTE — Addendum Note (Signed)
Addended by: Jiles Prows on: 12/04/2022 03:50 PM   Modules accepted: Orders

## 2022-12-04 NOTE — Telephone Encounter (Signed)
Prescription Request  12/04/2022  Is this a "Controlled Substance" medicine? No  LOV: 10/23/2022  What is the name of the medication or equipment?   amLODipine (NORVASC) 5 MG tablet [id number]   Have you contacted your pharmacy to request a refill? Yes   Which pharmacy would you like this sent to?   [facility], [place] [address] Phone: [phone] Fax: [phone]    Patient notified that their request is being sent to the clinical staff for review and that they should receive a response within 2 business days.   Please advise at Mobile [phone] (mobile)

## 2022-12-04 NOTE — Telephone Encounter (Deleted)
Information given to patient by [name] Pratt at front. Patient verbalized understanding.

## 2022-12-04 NOTE — Telephone Encounter (Signed)
Pt states the ER had told him they would send a message to pcp to send in more pain medication for him. He was not sure what they gave him but said he was fine for the moment and would touch back if he needed anything else. He may request more of this rx.

## 2022-12-04 NOTE — Addendum Note (Signed)
Addended by: Jiles Prows on: 12/04/2022 03:48 PM   Modules accepted: Orders

## 2022-12-05 NOTE — Telephone Encounter (Signed)
Was able to contact patient today (he was not answering the phone yesterday). Notified of provider's notes.

## 2022-12-06 ENCOUNTER — Ambulatory Visit (INDEPENDENT_AMBULATORY_CARE_PROVIDER_SITE_OTHER): Payer: Medicare HMO | Admitting: Family

## 2022-12-06 ENCOUNTER — Ambulatory Visit (HOSPITAL_BASED_OUTPATIENT_CLINIC_OR_DEPARTMENT_OTHER)
Admission: RE | Admit: 2022-12-06 | Discharge: 2022-12-06 | Disposition: A | Discharge status: Home / Self Care | Payer: Medicare HMO | Source: Ambulatory Visit | Attending: Family | Admitting: Family

## 2022-12-06 VITALS — BP 134/57 | HR 72 | Temp 97.9°F | Resp 16 | Wt 161.0 lb

## 2022-12-06 DIAGNOSIS — W19XXXD Unspecified fall, subsequent encounter: Secondary | ICD-10-CM | POA: Diagnosis not present

## 2022-12-06 DIAGNOSIS — Z01818 Encounter for other preprocedural examination: Secondary | ICD-10-CM | POA: Insufficient documentation

## 2022-12-06 DIAGNOSIS — I10 Essential (primary) hypertension: Secondary | ICD-10-CM

## 2022-12-06 DIAGNOSIS — R400 Somnolence: Secondary | ICD-10-CM

## 2022-12-06 DIAGNOSIS — J449 Chronic obstructive pulmonary disease, unspecified: Secondary | ICD-10-CM | POA: Diagnosis not present

## 2022-12-06 HISTORY — DX: Encounter for other preprocedural examination: Z01.818

## 2022-12-06 LAB — PROTIME-INR
INR: 0.9 ratio (ref 0.8–1.0)
Prothrombin Time: 10.5 s (ref 9.6–13.1)

## 2022-12-06 MED ORDER — AMLODIPINE BESYLATE 5 MG PO TABS: 5.0000 mg | ORAL_TABLET | Freq: Every day | ORAL | 1 refills | Status: DC

## 2022-12-06 NOTE — Progress Notes (Signed)
Subjective:   By signing my name below, I, [name] Pratt, attest that this documentation has been prepared under the direction and in the presence of [name] Alar, NP. 12/06/2022   Patient ID: [name] Pratt, male    DOB: 06/24/1941, 82 y.o.   MRN: [id number]  Chief Complaint  Patient presents with   Pre-op Exam    HPI Patient is in today for a pre-op visit. He will be having an upcoming L2-3, L3-4 Lumbar decompression/Diskectomy under general anesthesia.  Fall: He seen the ED on 12/03/2022 after a fall at home. Her reports having difficulty maintaining balance when reaching above his head. He also has difficulty going through doorways with his walker.   Procedure: He has an upcoming procedure on his spine to shave some of his discs. He is cleared from his cardiologist for this procedure.   Abdominal pain: He complains of abdominal discomfort after eating breakfast. He typically eats eggs, bacon, and sausage at a diner every morning. Once he "passes gas" pain resolves.   Drowsiness: He falls asleep while trying to complete paperwork at home. He is using a CPAP machine at night. He is working with a sleep specialist for his CPAP machine.   Urination: He also complains of mild difficulty while urinating but he is able to empty his bladder.    Past Medical History:  Diagnosis Date   Allergic rhinitis 02/02/2016   Anemia 12/17/2009   Formatting of this note might be different from the original. Anemia  10/1 IMO update   Aortic atherosclerosis ([place]) 03/05/2020   Arthritis    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   Atypical chest pain 11/24/2015   Benign essential hypertension 05/13/2009   Qualifier: Diagnosis of  By: Larwance Sachs of this note might be different from the original. 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)    skin cancer   Cardiac murmur 07/25/2021   Carotid stenosis    a. Carotid U/S [id number]: LICA < [id number], RICA 0000000;  b.  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   Cystoid macular edema of both eyes 07/28/2020   Degenerative disc disease, lumbar 06/30/2015   Deviated nasal septum 02/18/2015   Diverticulosis 03/03/2020   DJD (degenerative joint disease) of hip 12/24/2012   Dyspnea 06/12/2011   Emphysema, unspecified ([place]) 03/05/2020   Erectile dysfunction 06/20/2017   Exudative age-related macular degeneration of right eye with active choroidal neovascularization ([place]) 02/09/2021   Gait disorder 03/02/2021   GERD (gastroesophageal reflux disease)    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   Qualifier: Diagnosis of  By: Burnett Kanaris     Greater trochanteric pain syndrome 07/22/2021   Hand pain, left 01/16/2022   Hearing loss 02/02/2016   Heme positive stool 02/29/2020   HIATAL HERNIA 05/13/2009   Qualifier: Diagnosis of  By: Burnett Kanaris     High risk medication use 01/18/2012   Hip pain 03/02/2021   History of chicken pox    History of COVID-19 12/05/2021   History of  hemorrhoids    History of kidney stones    History of skin cancer 07/22/2021   skin cancer   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 Cardiology - Dr Frances Nickels at [facility] cardiology  ICD-10 cut over   Hypertension    under control   Hypokalemia 07/08/2021   Hypothyroidism    Internal hemorrhoids 03/03/2020    Leg cramps 11/13/2019   Lower GI bleed 12/12/2012   Lumbago of lumbar region with sciatica 10/21/2020   Lumbar radiculopathy 03/02/2021   Lumbar spondylosis 11/02/2020   NEPHROLITHIASIS 05/13/2009   Qualifier: Diagnosis of  By: Burnett Kanaris     Nonspecific abnormal electrocardiogram (ECG) (EKG) 01/06/2013   Numbness of hand 01/24/2022   Onychomycosis 06/30/2015   Osteoarthrosis, 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 Osteoarthritis Of The Hand  10/1 IMO update   Osteoarthrosis, unspecified whether generalized or localized, pelvic region and thigh 07/25/2010   Formatting of this note might be different from the original. Osteoarthritis Of The Hip   Other abnormal glucose 07/22/2021   Other allergic rhinitis 02/18/2015   Palpitations 07/25/2021   Peripheral neuropathy 06/19/2016   Preventative health care 11/24/2015   Right groin pain 11/29/2012   Right inguinal hernia 05/13/2009   Qualifier: Diagnosis of  By: Burnett Kanaris     RLS (restless legs syndrome) 02/18/2015   Rosacea    Rotator cuff tear 07/27/2013   Situational depression 03/02/2021   Skin lesion 01/16/2022   Sleep apnea with use of continuous positive airway pressure (CPAP) 07/15/2013   CPAP set to  7 cm water,  Residual AHi was 7.8  With no leak.  User time 6 hours.  479 days , not used only 12 days - highly compliant 06-23-13  .    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   Urinary retention 11/26/2020   Ventral hernia 07/08/2012   Vertigo 07/08/2021   Weight loss 03/03/2020    Past Surgical History:  Procedure Laterality Date    ABDOMINAL HERNIA REPAIR  07/15/12   Dr [name]   ANTERIOR CERVICAL DECOMP/DISCECTOMY FUSION N/A 07/09/2020   Procedure: ANTERIOR CERVICAL DECOMPRESSION AND FUSION CERVICAL THREE-FOUR.;  Surgeon: [name], MD;  Location: [facility];  Service: Neurosurgery;  Laterality: N/A;  anterior   BROW LIFT  05/07/01   COLONOSCOPY WITH PROPOFOL N/A 04/24/2022   Procedure: COLONOSCOPY WITH PROPOFOL;  Surgeon: Thornton Park, MD;  Location: [facility];  Service: Gastroenterology;  Laterality: N/A;   COLONOSCOPY WITH PROPOFOL N/A 04/26/2022   Procedure: COLONOSCOPY WITH PROPOFOL;  Surgeon: Thornton Park, MD;  Location: [facility];  Service: Gastroenterology;  Laterality: N/A;   CYSTOSCOPY WITH URETEROSCOPY AND STENT PLACEMENT Right 01/28/2014   Procedure: CYSTOSCOPY WITH URETEROSCOPY, BASKET RETRIVAL AND  STENT PLACEMENT;  Surgeon: [name], MD;  Location: [facility];  Service: Urology;  Laterality: Right;   epidural injections     multiple procedures   ESOPHAGEAL DILATION  1993 and 1994   multiple times   EYE SURGERY Bilateral 03/25/10,  2012   cataract removal   HEMORRHOID SURGERY  2014   HIATAL HERNIA REPAIR  12/21/93   HOLMIUM LASER APPLICATION Right [id number]   Procedure: HOLMIUM LASER APPLICATION;  Surgeon: [name], MD;  Location: [facility];  Service: Urology;  Laterality: Right;   INGUINAL HERNIA REPAIR Right 07/15/12   Dr [name], x2   JOINT REPLACEMENT  07/25/04   right knee   JOINT REPLACEMENT  07/13/10   left knee   [place]  09/20/06   with intraoperative cholangiogram and right inguinal herniorrhaphy with mesh    LIGAMENT REPAIR Left 07/2013   shoulder   LITHOTRIPSY     x2   [place]   POLYPECTOMY  04/26/2022   Procedure: POLYPECTOMY;  Surgeon: Thornton Park, MD;  Location: [place];  Service: Gastroenterology;;   POSTERIOR CERVICAL FUSION/FORAMINOTOMY N/A 11/24/2020   Procedure: Posterior Cervical Laminectomy  Cervical three-four, Cervical four-five with lateral mass fusion;  Surgeon: [name], MD;  Location: [facility];  Service: Neurosurgery;  Laterality: N/A;   REFRACTIVE SURGERY Bilateral    Right total hip replacement  4/14   SKIN CANCER DESTRUCTION     nose, ear   TONSILLECTOMY  as child   TOTAL HIP ARTHROPLASTY Left    URETHRAL DILATION  1991   [name]    Family History  Problem Relation Age of Onset   Cancer Mother    Hypertension Other    Cancer Other    Osteoarthritis Other    Stomach cancer Neg Hx    Colon cancer Neg Hx    Esophageal cancer Neg Hx     Social History   Socioeconomic History   Marital status: Married    Spouse name: Not on file   Number of children: 1   Years of education: Not on file   Highest education level: Not on file  Occupational History   Occupation: firefighter    Comment: paints on the side   Occupation: retired  Tobacco Use   Smoking status: Former    Years: 11    Types: Cigarettes    Quit date: 09/11/1978    Years since quitting: 44.2   Smokeless tobacco: Never  Vaping Use   Vaping Use: Never used  Substance and Sexual Activity   Alcohol use: Not Currently    Alcohol/week: 0.0 standard drinks of alcohol    Comment: rare   Drug use: No   Sexual activity: Not Currently  Other Topics Concern   Not on file  Social History Narrative   Lives with his wife   He has one son- lives locally.   2 grandchildren   Has worked for Research officer, trade union   Enjoys wood working   Completed HS.  [employer]   Social Determinants of Health   Financial Resource Strain: Low Risk  (10/28/2021)   Overall Financial Resource Strain (CARDIA)    Difficulty of Paying Living Expenses: Not very hard  Food Insecurity: Unknown (06/05/2022)   Hunger Vital Sign    Worried About Running Out of Food in the Last Year: Never true    Ran Out of Food in the Last Year: Not on file  Transportation Needs: Unknown (06/05/2022)   PRAPARE - Radiographer, therapeutic (Medical): No    Lack of Transportation (Non-Medical): Not on file  Physical Activity: Insufficiently Active (10/28/2021)   Exercise Vital Sign    Days of Exercise per Week: 2  days    Minutes of Exercise per Session: 30 min  Stress: No Stress Concern Present (01/19/2022)   [place]    Feeling of Stress : Only a little  Social Connections: Moderately Isolated (01/19/2022)   Social Connection and Isolation Panel [NHANES]    Frequency of Communication with Friends and Family: More than three times a week    Frequency of Social Gatherings with Friends and Family: More than three times a week    Attends Religious Services: Never    Marine scientist or Organizations: No    Attends Archivist Meetings: Never    Marital Status: Married  Human resources officer Violence: Not At Risk (01/19/2022)   Humiliation, Afraid, Rape, and Kick questionnaire    Fear of Current or Ex-Partner: No    Emotionally Abused: No    Physically Abused: No    Sexually Abused: No    Outpatient Medications Prior to Visit  Medication Sig Dispense Refill   acetaminophen (TYLENOL) 650 MG CR tablet Take 650-1,300 mg by mouth every 8 (eight) hours as needed for pain.     albuterol (VENTOLIN HFA) 108 (90 Base) MCG/ACT inhaler INHALE 2 PUFFS INTO THE LUNGS EVERY 6 HOURS AS NEEDED FOR SHORTNESS OF BREATH. (Patient taking differently: Inhale 2 puffs into the lungs every 6 (six) hours as needed for shortness of breath.) 1 each 3   allopurinol (ZYLOPRIM) 100 MG tablet Take 1 tablet (100 mg total) by mouth 2 (two) times daily. 180 tablet 1   antiseptic oral rinse (BIOTENE) LIQD 15 mLs by Mouth Rinse route in the morning.     aspirin EC 81 MG tablet Take 81 mg by mouth at bedtime.     bacitracin ointment Apply 1 Application topically 2 (two) times daily. 120 g 0   cycloSPORINE (RESTASIS) 0.05 % ophthalmic emulsion Place 1 drop into both eyes 2  (two) times daily.     diclofenac Sodium (VOLTAREN) 1 % GEL Apply 2 g topically daily as needed (for arthritic pain in the hands).     docusate sodium (COLACE) 100 MG capsule Take 1 capsule (100 mg total) by mouth 2 (two) times daily. 60 capsule 11   gabapentin (NEURONTIN) 300 MG capsule Take 1 capsule by mouth at bedtime.     GEMTESA 75 MG TABS Take 75 mg by mouth daily.     glucosamine-chondroitin 500-400 MG tablet Take 1 tablet by mouth 3 (three) times daily.     glucose blood test strip TRUE Metrix Blood Glucose Test Strip, use as instructed 100 each 12   Homeopathic Products (THERAWORX RELIEF) FOAM Apply 1 application topically 2 (two) times daily as needed (Pain).     hydrALAZINE (APRESOLINE) 50 MG tablet Take 1 tablet (50 mg total) by mouth 2 (two) times daily. 180 tablet 1   Iron, Ferrous Sulfate, 325 (65 Fe) MG TABS Take 325 mg by mouth every other day. 30 tablet 0   Lancets MISC 1 each by Does not apply route 2 (two) times daily. TRUE matrix lancets 100 each 3   levothyroxine (SYNTHROID) 150 MCG tablet TAKE 1 TABLET ON 6 DAYS PER WEEK (TAKE 75 MCG ONE TIME PER WEEK) 78 tablet 1   levothyroxine (SYNTHROID) 75 MCG tablet Take 75 mcg once a week (patient on 150 mcg 6 days a week) Dispense #24 for 90 day supply. 12 tablet 1   loratadine (CLARITIN) 10 MG tablet Take 10 mg by mouth daily as  needed for allergies or rhinitis.     losartan (COZAAR) 100 MG tablet TAKE 1 TABLET EVERY DAY 90 tablet 1   Magnesium 500 MG TABS Take 500 mg by mouth daily.     Multiple Vitamins-Minerals (MULTIVITAMIN WITH MINERALS) tablet Take 1 tablet by mouth daily.     nitroGLYCERIN (NITROSTAT) 0.4 MG SL tablet Place 1 tablet (0.4 mg total) under the tongue every 5 (five) minutes as needed for chest pain. 25 tablet 6   polyethylene glycol (MIRALAX / GLYCOLAX) 17 g packet Take 17-34 g by mouth daily as needed for mild constipation.     polyvinyl alcohol (ARTIFICIAL TEARS) 1.4 % ophthalmic solution Place 1 drop into  both eyes as needed for dry eyes.     PRESCRIPTION MEDICATION CPAP- At bedtime and during any time of rest     Probiotic Product (PROBIOTIC DAILY PO) Take 1 capsule by mouth daily.     rosuvastatin (CRESTOR) 5 MG tablet Take 1 tablet (5 mg total) by mouth daily. 90 tablet 1   sodium chloride (OCEAN) 0.65 % nasal spray Place 1 spray into both nostrils as needed for congestion.     traMADol (ULTRAM) 50 MG tablet Take 1 tablet (50 mg total) by mouth every 12 (twelve) hours as needed. 10 tablet 0   vitamin B-12 (CYANOCOBALAMIN) 500 MCG tablet Take 500 mcg by mouth daily.     amLODipine (NORVASC) 5 MG tablet Take 1.5 tablets (7.5 mg total) by mouth daily. 90 tablet 1   doxycycline (VIBRAMYCIN) 100 MG capsule Take 100 mg by mouth 2 (two) times daily.     No facility-administered medications prior to visit.    Allergies  Allergen Reactions   Pravastatin Other (See Comments)    Muscle cramps   Sildenafil Other (See Comments)    Tachycardia     Oxycodone Other (See Comments)    Hallucinations    Atenolol Other (See Comments)    Bradycardia    Elastic Bandages & [Zinc] Other (See Comments)    Teflon bandages - Irritation   Metoclopramide Hcl Anxiety    ROS    See HPI  Objective:    Physical Exam Constitutional:      General: He is not in acute distress.    Appearance: Normal appearance. He is not ill-appearing.  HENT:     Head: Normocephalic and atraumatic.     Right Ear: External ear normal.     Left Ear: External ear normal.  Eyes:     Extraocular Movements: Extraocular movements intact.     Pupils: Pupils are equal, round, and reactive to light.  Cardiovascular:     Rate and Rhythm: Normal rate and regular rhythm.     Heart sounds: Normal heart sounds. No murmur heard.    No gallop.  Pulmonary:     Effort: Pulmonary effort is normal. No respiratory distress.     Breath sounds: Normal breath sounds. No wheezing or rales.  Musculoskeletal:     Comments: Patient is  using walker for ambulatory assistance  Skin:    General: Skin is warm and dry.  Neurological:     Mental Status: He is alert and oriented to person, place, and time.  Psychiatric:        Judgment: Judgment normal.     BP (!) 134/57 (BP Location: Right Arm, Patient Position: Sitting, Cuff Size: Small)   Pulse 72   Temp 97.9 F (36.6 C) (Oral)   Resp 16   Wt 161 lb (73  kg)   SpO2 99%   BMI 25.22 kg/m  Wt Readings from Last 3 Encounters:  12/06/22 161 lb (73 kg)  12/03/22 158 lb 11.7 oz (72 kg)  11/14/22 158 lb 11.7 oz (72 kg)       Assessment & Plan:  Preop examination Assessment & Plan: Reviewed X-ray, PT INR and labs done recently at the hospital.  Cardiology has cleared him from a cardiac perspective.  Plan to hold aspirin for 7 days pre-op and resume post op. He does have a history of CPAP and will need close monitoring post-operatively.  No medical contraindication to surgery.  EKG tracing is personally reviewed.  EKG notes NSR.  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    Orders: -     EKG 12-Lead -     DG Chest 2 View; Future -     Protime-INR  Fall, subsequent encounter Assessment & Plan: Pt had another fall recently and was evaluated in the ED.  We discussed possibility of a hoverround.  Pt is not interested and prefers to continue using his walker.    Hypertension, unspecified type Assessment & Plan: BP stable on amlodipine and losartan, continue same.    Other orders -     amLODIPine Besylate; Take 1 tablet (5 mg total) by mouth daily.  Dispense: 90 tablet; Refill: 1   30 minutes spent on today's visit. Time was spent reviewing patient record, interviewing/examining patient.   I, Nance Pear, NP, personally preformed the services described in this documentation.  All medical record entries made by the scribe were at my direction and in my presence.  I have reviewed the chart and discharge instructions (if applicable) and agree that  the record reflects my personal performance and is accurate and complete. 12/06/2022   I,[name] Pratt,acting as a scribe for Nance Pear, NP.,have documented all relevant documentation on the behalf of Nance Pear, NP,as directed by  Nance Pear, NP while in the presence of Nance Pear, NP.   Nance Pear, NP

## 2022-12-06 NOTE — Assessment & Plan Note (Signed)
Pt had another fall recently and was evaluated in the ED.  We discussed possibility of a hoverround.  Pt is not interested and prefers to continue using his walker.

## 2022-12-06 NOTE — Assessment & Plan Note (Addendum)
Reviewed X-ray, PT INR and labs done recently at the hospital.  Cardiology has cleared him from a cardiac perspective.  Plan to hold aspirin for 7 days pre-op and resume post op. He does have a history of CPAP and will need close monitoring post-operatively.  No medical contraindication to surgery.  EKG tracing is personally reviewed.  EKG notes NSR.  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

## 2022-12-07 DIAGNOSIS — R400 Somnolence: Secondary | ICD-10-CM

## 2022-12-07 HISTORY — DX: Somnolence: R40.0

## 2022-12-07 NOTE — Assessment & Plan Note (Signed)
Advised pt to schedule follow up with sleep specialist to see if his cpap settings may need adjustment.

## 2022-12-07 NOTE — Assessment & Plan Note (Signed)
BP stable on amlodipine and losartan, continue same.

## 2022-12-15 ENCOUNTER — Telehealth: Payer: Self-pay | Admitting: Physical Therapy

## 2022-12-15 ENCOUNTER — Other Ambulatory Visit: Payer: Self-pay | Admitting: Neurosurgery

## 2022-12-15 ENCOUNTER — Telehealth: Payer: Self-pay | Admitting: Family

## 2022-12-15 NOTE — Telephone Encounter (Signed)
Pt would like status update on whether we have okayed him for surgery and communicated this to Dr. Lonie Peak office. Please call him to advise.

## 2022-12-15 NOTE — Telephone Encounter (Signed)
Called patient as planned for 2-week follow-up from 30-day hold initiated on 11/30/2022.  [name] Pratt reports he is still waiting to hear about scheduling his back surgery but feels like he has been given clearance from his cardiologist and is just waiting to hear back from his PCP.  He reports he has been moving cautiously and has not had any recent falls, but continues to feel weak.  He is aware that if the back surgery is not scheduled soon or is unable to be completed that he can call to resume PT within 30 days of the hold started on 11/30/22 to continue to address his ongoing weakness and high risk for falls.  Marry Guan, PT, MPT  [facility] [address]  [address] [place], [place], [address] Phone: [phone]   Fax:  [phone]

## 2022-12-15 NOTE — Telephone Encounter (Signed)
Patient advised clearance was faxed to ortho again this morning with ov note and EKG (16 pages in total)

## 2022-12-18 DIAGNOSIS — N3941 Urge incontinence: Secondary | ICD-10-CM | POA: Diagnosis not present

## 2022-12-18 DIAGNOSIS — N401 Enlarged prostate with lower urinary tract symptoms: Secondary | ICD-10-CM | POA: Diagnosis not present

## 2022-12-21 NOTE — Pre-Procedure Instructions (Signed)
Surgical Instructions    Your procedure is scheduled on Wednesday, April 17.  Report to [facility] Main Entrance "A" at 9:35 A.M., then check in with the Admitting office.  Call this number if you have problems the morning of surgery:  [phone]   If you have any questions prior to your surgery date call [phone]: Open Monday-Friday 8am-4pm If you experience any cold or flu symptoms such as cough, fever, chills, shortness of breath, etc. between now and your scheduled surgery, please notify us at the above number     Remember:  Do not eat or drink after midnight the night before your surgery    Take these medicines the morning of surgery with A SIP OF WATER:  allopurinol (ZYLOPRIM) amLODipine (NORVASC)  cycloSPORINE (RESTASIS) 0.05 % ophthalmic emulsion  GEMTESA  hydrALAZINE (APRESOLINE)  levothyroxine (SYNTHROID)  rosuvastatin (CRESTOR)  antiseptic oral rinse (BIOTENE) LIQD   IF needed: acetaminophen (TYLENOL)  loratadine (CLARITIN)  traMADol (ULTRAM)  albuterol (VENTOLIN HFA) 108 (90 Base) MCG/ACT inhaler - Please bring all inhalers with you the day of surgery.  polyvinyl alcohol (ARTIFICIAL TEARS) 1.4 % ophthalmic solution  sodium chloride (OCEAN) 0.65 % nasal spray   Follow your surgeon's instructions on when to stop Aspirin.  If no instructions were given by your surgeon then you will need to call the office to get those instructions.    As of today, STOP taking any  Aleve, Naproxen, Ibuprofen, Motrin, Advil, Goody's, BC's, all herbal medications, fish oil, and all vitamins, diclofenac Sodium (VOLTAREN) 1 % GEL             [facility] is not responsible for any belongings or valuables.    Do NOT Smoke (Tobacco/Vaping)  24 hours prior to your procedure  If you use a CPAP at night, you may bring your mask for your overnight stay.   Contacts, glasses, hearing aids, dentures or partials may not be worn into surgery, please bring cases for these belongings   For  patients admitted to the hospital, discharge time will be determined by your treatment team.   Patients discharged the day of surgery will not be allowed to drive home, and someone needs to stay with them for 24 hours.   SURGICAL WAITING ROOM VISITATION Patients having surgery or a procedure may have no more than 2 support people in the waiting area - these visitors may rotate.   Children under the age of 75 must have an adult with them who is not the patient. If the patient needs to stay at the hospital during part of their recovery, the visitor guidelines for inpatient rooms apply. Pre-op nurse will coordinate an appropriate time for 1 support person to accompany patient in pre-op.  This support person may not rotate.   Please refer to [email or web address] for the visitor guidelines for Inpatients (after your surgery is over and you are in a regular room).    Special instructions:    Oral Hygiene is also important to reduce your risk of infection.  Remember - BRUSH YOUR TEETH THE MORNING OF SURGERY WITH YOUR REGULAR TOOTHPASTE   Please follow attached instructions regarding showering with CHG prior to surgery.   Day of Surgery:  Take a shower with CHG soap. Wear Clean/Comfortable clothing the morning of surgery Do not wear jewelry or makeup. Do not wear lotions, powders, perfumes/cologne or deodorant. Do not shave 48 hours prior to surgery.  Men may shave face and neck. Do not bring valuables to the hospital.  Do not wear nail polish, gel polish, artificial nails, or any other type of covering on natural nails (fingers and toes) If you have artificial nails or gel coating that need to be removed by a nail salon, please have this removed prior to surgery. Artificial nails or gel coating may interfere with anesthesia's ability to adequately monitor your vital signs.  Remember to brush your teeth WITH YOUR REGULAR TOOTHPASTE.    If you  received a COVID test during your pre-op visit, it is requested that you wear a mask when out in public, stay away from anyone that may not be feeling well, and notify your surgeon if you develop symptoms. If you have been in contact with anyone that has tested positive in the last 10 days, please notify your surgeon.    Please read over the following fact sheets that you were given.

## 2022-12-22 ENCOUNTER — Encounter (HOSPITAL_COMMUNITY): Payer: Self-pay

## 2022-12-22 ENCOUNTER — Encounter (HOSPITAL_COMMUNITY)
Admission: RE | Admit: 2022-12-22 | Discharge: 2022-12-22 | Disposition: A | Discharge status: Home / Self Care | Payer: Medicare HMO | Source: Ambulatory Visit | Attending: Neurosurgery | Admitting: Neurosurgery

## 2022-12-22 ENCOUNTER — Other Ambulatory Visit: Payer: Self-pay

## 2022-12-22 VITALS — BP 121/60 | HR 82 | Temp 97.8°F | Resp 18 | Ht 67.0 in | Wt 159.0 lb

## 2022-12-22 DIAGNOSIS — Z01818 Encounter for other preprocedural examination: Secondary | ICD-10-CM | POA: Insufficient documentation

## 2022-12-22 LAB — SURGICAL PCR SCREEN
MRSA, PCR: NEGATIVE
Staphylococcus aureus: NEGATIVE

## 2022-12-22 NOTE — Progress Notes (Signed)
PCP - [name] NP Cardiologist - DR. Nishan  PPM/ICD - DEnies  Chest x-ray - 12/06/22 EKG - 12/06/22 Stress Test - 08/02/21 ECHO - 08/27/21 Cardiac Cath - Denies  Sleep Study - Yes has OSA CPAP - nightly  DM - Denies   Blood Thinner Instructions:N/A Aspirin Instructions:Per patient stopped 3 days ago. Last dose Monday 8th  COVID TEST- No  No flu, PNA, respiratory virus, or cold in past 2 months.   Anesthesia review: Yes cardiac hx  Patient denies shortness of breath, fever, cough and chest pain at PAT appointment   All instructions explained to the patient, with a verbal understanding of the material. Patient agrees to go over the instructions while at home for a better understanding. . The opportunity to ask questions was provided.

## 2022-12-25 ENCOUNTER — Encounter: Payer: Self-pay | Admitting: *Deleted

## 2022-12-25 NOTE — Anesthesia Preprocedure Evaluation (Addendum)
Anesthesia Evaluation  Patient identified by MRN, date of birth, ID band Patient awake    Reviewed: Allergy & Precautions, NPO status , Patient's Chart, lab work & pertinent test results  Airway Mallampati: III  TM Distance: >3 FB Neck ROM: Full    Dental  (+) Teeth Intact, Dental Advisory Given, Caps   Pulmonary sleep apnea and Continuous Positive Airway Pressure Ventilation , COPD, former smoker   Pulmonary exam normal breath sounds clear to auscultation       Cardiovascular hypertension, Pt. on medications + Peripheral Vascular Disease  Normal cardiovascular exam Rhythm:Regular Rate:Normal     Neuro/Psych  PSYCHIATRIC DISORDERS  Depression    Lumbar stenosis Cervical myelopathy  Neuromuscular disease    GI/Hepatic Neg liver ROS,GERD  ,,  Endo/Other  Hypothyroidism    Renal/GU negative Renal ROS     Musculoskeletal  (+) Arthritis ,    Abdominal   Peds  Hematology negative hematology ROS (+)   Anesthesia Other Findings Day of surgery medications reviewed with the patient.  Reproductive/Obstetrics                              Anesthesia Physical Anesthesia Plan  ASA: 3  Anesthesia Plan: General   Post-op Pain Management: Tylenol PO (pre-op)*   Induction: Intravenous  PONV Risk Score and Plan: 2 and Dexamethasone and Ondansetron  Airway Management Planned: Oral ETT  Additional Equipment:   Intra-op Plan:   Post-operative Plan: Extubation in OR  Informed Consent: I have reviewed the patients History and Physical, chart, labs and discussed the procedure including the risks, benefits and alternatives for the proposed anesthesia with the patient or authorized representative who has indicated his/her understanding and acceptance.     Dental advisory given  Plan Discussed with: CRNA  Anesthesia Plan Comments: (See PAT note from 4/12)         Anesthesia Quick  Evaluation

## 2022-12-25 NOTE — Progress Notes (Signed)
Case: [id number] Date/Time: 12/27/22 1120   Procedure: Sublaminar decompression - L2-L3 - L3-L4 - bilateral possible left-sided microdiskectomy at L3-4 (Bilateral: Back)   Anesthesia type: General   Pre-op diagnosis: Stenosis   [facility] / [facility]   Surgeons: Donalee Citrin, MD       DISCUSSION: [name] Pratt is an 82 yo who presents to PAT for surgery listed above on 12/27/22. PMH significant for aortic atherosclerosis, HTN, PVD, COPD, OSA uses CPAP, GERD, former smoker. No prior anesthesia complications. ASA 3  #Aortic atherosclerosis -Last saw Cardiology 01/27/23 and primary prevention recommended. -On statin  #HTN -Controlled on current regimen  #Preoperative Cardiovascular Risk Assessment: -Received 11/30/22:  "Mr. Rachel's perioperative risk of a major cardiac event is 0.9% according to the Revised Cardiac Risk Index (RCRI).  Therefore, he is at low risk for perioperative complications.   His functional capacity is fair at 4.73 METs according to the Duke Activity Status Index (DASI). Recommendations: According to ACC/AHA guidelines, no further cardiovascular testing needed.  The patient may proceed to surgery at acceptable risk.   Antiplatelet and/or Anticoagulation Recommendations: Aspirin can be held for 5-7 days prior to his surgery.  Please resume Aspirin post operatively when it is felt to be safe from a bleeding standpoint."   #COPD -Former smoker. On RA and without respiratory complaints at PAT visit -Uses inhaler prn  #OSA -Uses CPAP  #GERD -Does not appear to take meds for this  VS: BP 121/60   Pulse 82   Temp 36.6 C (Oral)   Resp 18   Ht 5\' 7"  (1.702 m)   Wt 72.1 kg   SpO2 96%   BMI 24.90 kg/m   PROVIDERS: PCP: [name], NP Cardiology: Dr. [name]  LABS: Labs reviewed: Acceptable for surgery. (all labs ordered are listed, but only abnormal results are displayed)  Labs Reviewed  SURGICAL PCR SCREEN     IMAGES:  CXR  12/06/22:  IMPRESSION: No active cardiopulmonary disease. COPD.   CV:  EKG 12/06/22: Sinus rhythm with 1st degree block RBBB Left anterior fascicular block No significant change   US Carotid bilateral 03/07/22:  IMPRESSION: Color duplex indicates minimal heterogeneous and calcified plaque, with no hemodynamically significant stenosis by duplex criteria in the extracranial cerebrovascular circulation.  Echo 08/24/21:   1. Left ventricular ejection fraction, by estimation, is 55 to 60%. The  left ventricle has normal function. The left ventricle has no regional  wall motion abnormalities. There is mild concentric left ventricular  hypertrophy. Left ventricular diastolic  parameters are consistent with Grade I diastolic dysfunction (impaired  relaxation).   2. Right ventricular systolic function is normal. The right ventricular  size is normal.   3. The mitral valve is degenerative. Mild mitral valve regurgitation. No  evidence of mitral stenosis.   4. The aortic valve is tricuspid. Aortic valve regurgitation is not  visualized. Aortic valve sclerosis is present, with no evidence of aortic  valve stenosis.   5. The inferior vena cava is normal in size with greater than 50%  respiratory variability, suggesting right atrial pressure of 3 mmHg.   Comparison(s): LVEF 55-60%.   Stress test 08/02/21:   The patient reported dyspnea during the stress test.   ECG rhythm shows normal sinus rhythm. T wave inversion noted in 2 or more leads. ECG demonstrates right bundle branch block.   No ST deviation was noted. There were no arrhythmias during stress. There were no arrhythmias during recovery. ECG was interpretable and without  significant changes. The ECG was not diagnostic due to pharmacologic protocol.   Diaphragmatic attenuation artifact was present. Image quality affected due to significant extracardiac activity.   LV perfusion is normal. There is no evidence of ischemia. There is  no evidence of infarction.   Left ventricular function is normal. Nuclear stress EF: 54 %. The left ventricular ejection fraction is normal (55-65%). No evidence of transient ischemic dilation (TID) noted.   The study is normal. The study is low risk.   Prior study available for comparison from 01/25/2016.   Past Medical History:  Diagnosis Date   Allergic rhinitis 02/02/2016   Anemia 12/17/2009   Formatting of this note might be different from the original. 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 Hypertension   Benign paroxysmal positional vertigo 09/14/2021   Bilateral sacroiliitis 07/22/2021   Body mass index (BMI) 25.0-25.9, adult 11/02/2020   Cancer    skin cancer   Cardiac murmur 07/25/2021   Carotid stenosis    a. Carotid U/S 5/13: LICA < 50%, RICA 50-69%;  b.  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   Cystoid macular edema of both eyes 07/28/2020   Degenerative disc disease, lumbar 06/30/2015   Deviated nasal septum 02/18/2015   Diverticulosis 03/03/2020   DJD (degenerative joint disease) of hip 12/24/2012   Dyspnea 06/12/2011   Emphysema, unspecified 03/05/2020   Erectile dysfunction 06/20/2017   Exudative age-related macular degeneration of right eye with active choroidal neovascularization 02/09/2021   Gait disorder 03/02/2021   GERD (gastroesophageal reflux  disease)    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   Qualifier: Diagnosis of  By: Kem Parkinson     Greater trochanteric pain syndrome 07/22/2021   Hand pain, left 01/16/2022   Hearing loss 02/02/2016   Heme positive stool 02/29/2020   HIATAL HERNIA 05/13/2009   Qualifier: Diagnosis of  By: Kem Parkinson     High risk medication use 01/18/2012   Hip pain 03/02/2021   History of chicken pox    History of COVID-19 12/05/2021   History of hemorrhoids    History of kidney stones    History of skin cancer 07/22/2021   skin cancer   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 Cardiology - Dr Estrella Myrtle at [facility]  ICD-10 cut over   Hypertension    under control   Hypokalemia 07/08/2021   Hypothyroidism    Internal hemorrhoids 03/03/2020   Leg cramps 11/13/2019   Lower GI bleed 12/12/2012   Lumbago of lumbar region with sciatica 10/21/2020   Lumbar radiculopathy 03/02/2021   Lumbar spondylosis 11/02/2020   NEPHROLITHIASIS 05/13/2009   Qualifier: Diagnosis of  By: Kem Parkinson     Nonspecific abnormal electrocardiogram (ECG) (EKG) 01/06/2013  Numbness of hand 01/24/2022   Onychomycosis 06/30/2015   Osteoarthrosis, 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 Osteoarthritis Of The Hand  10/1 IMO update   Osteoarthrosis, unspecified whether generalized or localized, pelvic region and thigh 07/25/2010   Formatting of this note might be different from the original. Osteoarthritis Of The Hip   Other abnormal glucose 07/22/2021   Other allergic rhinitis 02/18/2015   Palpitations 07/25/2021   Peripheral neuropathy 06/19/2016   Preventative health care 11/24/2015   Right groin pain 11/29/2012   Right inguinal hernia 05/13/2009   Qualifier: Diagnosis of  By: Kem Parkinson     RLS (restless  legs syndrome) 02/18/2015   Rosacea    Rotator cuff tear 07/27/2013   Situational depression 03/02/2021   Skin lesion 01/16/2022   Sleep apnea with use of continuous positive airway pressure (CPAP) 07/15/2013   CPAP set to  7 cm water,  Residual AHi was 7.8  With no leak.  User time 6 hours.  479 days , not used only 12 days - highly compliant 06-23-13  .    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 Fawn Kirk   Ulnar neuropathy 01/24/2022   Urinary retention 11/26/2020   Ventral hernia 07/08/2012   Vertigo 07/08/2021   Weight loss 03/03/2020    Past Surgical History:  Procedure Laterality Date   ABDOMINAL HERNIA REPAIR  07/15/12   Dr Ashley Jacobs   ANTERIOR CERVICAL DECOMP/DISCECTOMY FUSION N/A 07/09/2020   Procedure: ANTERIOR CERVICAL DECOMPRESSION AND FUSION CERVICAL THREE-FOUR.;  Surgeon: Donalee Citrin, MD;  Location: [facility] OR;  Service: Neurosurgery;  Laterality: N/A;  anterior   BROW LIFT  05/07/01   COLONOSCOPY WITH PROPOFOL N/A 04/24/2022   Procedure: COLONOSCOPY WITH PROPOFOL;  Surgeon: Tressia Danas, MD;  Location: [facility] ENDOSCOPY;  Service: Gastroenterology;  Laterality: N/A;   COLONOSCOPY WITH PROPOFOL N/A 04/26/2022   Procedure: COLONOSCOPY WITH PROPOFOL;  Surgeon: Tressia Danas, MD;  Location: [facility] ENDOSCOPY;  Service: Gastroenterology;  Laterality: N/A;   CYSTOSCOPY WITH URETEROSCOPY AND STENT PLACEMENT Right 01/28/2014   Procedure: CYSTOSCOPY WITH URETEROSCOPY, BASKET RETRIVAL AND  STENT PLACEMENT;  Surgeon: Valetta Fuller, MD;  Location: [facility] ORS;  Service: Urology;  Laterality: Right;   epidural injections     multiple procedures   ESOPHAGEAL DILATION  1993 and 1994   multiple times   EYE SURGERY  Bilateral 03/25/10, 2012   cataract removal   HEMORRHOID SURGERY  2014   HIATAL HERNIA REPAIR  12/21/93   HOLMIUM LASER APPLICATION Right 01/28/2014   Procedure: HOLMIUM LASER APPLICATION;  Surgeon: Valetta Fuller, MD;  Location: [facility] ORS;  Service: Urology;  Laterality: Right;   INGUINAL HERNIA REPAIR Right 07/15/12   Dr Ashley Jacobs, x2   JOINT REPLACEMENT  07/25/04   right knee   JOINT REPLACEMENT  07/13/10   left knee   KNEE SURGERY  1995   LAPAROSCOPIC CHOLECYSTECTOMY  09/20/06   with intraoperative cholangiogram and right inguinal herniorrhaphy with mesh    LIGAMENT REPAIR Left 07/2013   shoulder   LITHOTRIPSY     x2   NASAL SEPTUM SURGERY  1975   POLYPECTOMY  04/26/2022   Procedure: POLYPECTOMY;  Surgeon: Tressia Danas, MD;  Location:  [facility] ENDOSCOPY;  Service: Gastroenterology;;   POSTERIOR CERVICAL FUSION/FORAMINOTOMY N/A 11/24/2020   Procedure: Posterior Cervical Laminectomy Cervical three-four, Cervical four-five with lateral mass fusion;  Surgeon: Donalee Citrin, MD;  Location: [facility] OR;  Service: Neurosurgery;  Laterality: N/A;   REFRACTIVE SURGERY Bilateral    Right total hip replacement  4/14   SKIN CANCER DESTRUCTION     nose, ear   TONSILLECTOMY  as child   TOTAL HIP ARTHROPLASTY Left    URETHRAL DILATION  1991   Dr. Annabell Howells    MEDICATIONS:  acetaminophen (TYLENOL) 500 MG tablet   albuterol (VENTOLIN HFA) 108 (90 Base) MCG/ACT inhaler   allopurinol (ZYLOPRIM) 100 MG tablet   amLODipine (NORVASC) 5 MG tablet   antiseptic oral rinse (BIOTENE) LIQD   aspirin EC 81 MG tablet   bacitracin ointment   cholestyramine (QUESTRAN) 4 GM/DOSE powder   cycloSPORINE (RESTASIS) 0.05 % ophthalmic emulsion   docusate sodium (COLACE) 100 MG capsule   fluticasone (FLONASE) 50 MCG/ACT nasal spray   gabapentin (NEURONTIN) 300 MG capsule   GEMTESA 75 MG TABS   glucose blood test strip   hydrALAZINE (APRESOLINE) 50 MG tablet   Iron, Ferrous Sulfate, 325 (65 Fe) MG TABS   Lancets MISC    levothyroxine (SYNTHROID) 150 MCG tablet   levothyroxine (SYNTHROID) 75 MCG tablet   loratadine (CLARITIN) 10 MG tablet   losartan (COZAAR) 100 MG tablet   Magnesium 500 MG TABS   Menthol, Topical Analgesic, (ICY HOT EX)   metroNIDAZOLE (METROCREAM) 0.75 % cream   Multiple Vitamins-Minerals (MULTIVITAMIN WITH MINERALS) tablet   multivitamin-lutein (OCUVITE-LUTEIN) CAPS capsule   nitroGLYCERIN (NITROSTAT) 0.4 MG SL tablet   OVER THE COUNTER MEDICATION   OVER THE COUNTER MEDICATION   polyethylene glycol (MIRALAX / GLYCOLAX) 17 g packet   polyvinyl alcohol (ARTIFICIAL TEARS) 1.4 % ophthalmic solution   PREBIOTIC PRODUCT PO   PRESCRIPTION MEDICATION   Probiotic Product (PROBIOTIC DAILY PO)   rosuvastatin (CRESTOR) 5 MG tablet   vitamin B-12 (CYANOCOBALAMIN) 500 MCG tablet   No current facility-administered medications for this encounter.   Marcille Blanco [facility] Short Stay/Anesthesiology [facility] Phone [phone] 12/25/2022 1:10 PM

## 2022-12-27 ENCOUNTER — Ambulatory Visit (HOSPITAL_COMMUNITY): Payer: Medicare HMO

## 2022-12-27 ENCOUNTER — Ambulatory Visit (HOSPITAL_COMMUNITY)
Admission: RE | Admit: 2022-12-27 | Discharge: 2022-12-28 | Disposition: A | Discharge status: Home / Self Care | Payer: Medicare HMO | Attending: Neurosurgery | Admitting: Neurosurgery

## 2022-12-27 ENCOUNTER — Other Ambulatory Visit: Payer: Self-pay

## 2022-12-27 ENCOUNTER — Ambulatory Visit (HOSPITAL_COMMUNITY)
Admission: RE | Disposition: A | Discharge status: Home / Self Care | Payer: Self-pay | Source: Home / Self Care | Attending: Neurosurgery

## 2022-12-27 ENCOUNTER — Ambulatory Visit (HOSPITAL_COMMUNITY): Payer: Medicare HMO | Admitting: Medical

## 2022-12-27 ENCOUNTER — Ambulatory Visit (HOSPITAL_BASED_OUTPATIENT_CLINIC_OR_DEPARTMENT_OTHER): Payer: Medicare HMO | Admitting: Anesthesiology

## 2022-12-27 ENCOUNTER — Encounter (HOSPITAL_COMMUNITY): Payer: Self-pay | Admitting: Neurosurgery

## 2022-12-27 DIAGNOSIS — J449 Chronic obstructive pulmonary disease, unspecified: Secondary | ICD-10-CM | POA: Insufficient documentation

## 2022-12-27 DIAGNOSIS — M48062 Spinal stenosis, lumbar region with neurogenic claudication: Secondary | ICD-10-CM | POA: Insufficient documentation

## 2022-12-27 DIAGNOSIS — Z8249 Family history of ischemic heart disease and other diseases of the circulatory system: Secondary | ICD-10-CM | POA: Insufficient documentation

## 2022-12-27 DIAGNOSIS — M5416 Radiculopathy, lumbar region: Secondary | ICD-10-CM | POA: Insufficient documentation

## 2022-12-27 DIAGNOSIS — Z87891 Personal history of nicotine dependence: Secondary | ICD-10-CM | POA: Diagnosis not present

## 2022-12-27 DIAGNOSIS — K219 Gastro-esophageal reflux disease without esophagitis: Secondary | ICD-10-CM | POA: Diagnosis not present

## 2022-12-27 DIAGNOSIS — R2689 Other abnormalities of gait and mobility: Secondary | ICD-10-CM | POA: Insufficient documentation

## 2022-12-27 DIAGNOSIS — I10 Essential (primary) hypertension: Secondary | ICD-10-CM | POA: Diagnosis not present

## 2022-12-27 DIAGNOSIS — I739 Peripheral vascular disease, unspecified: Secondary | ICD-10-CM | POA: Insufficient documentation

## 2022-12-27 DIAGNOSIS — G4730 Sleep apnea, unspecified: Secondary | ICD-10-CM | POA: Diagnosis not present

## 2022-12-27 DIAGNOSIS — E039 Hypothyroidism, unspecified: Secondary | ICD-10-CM | POA: Diagnosis not present

## 2022-12-27 DIAGNOSIS — Z4789 Encounter for other orthopedic aftercare: Secondary | ICD-10-CM | POA: Diagnosis not present

## 2022-12-27 DIAGNOSIS — M48061 Spinal stenosis, lumbar region without neurogenic claudication: Secondary | ICD-10-CM | POA: Diagnosis present

## 2022-12-27 DIAGNOSIS — Z8261 Family history of arthritis: Secondary | ICD-10-CM | POA: Insufficient documentation

## 2022-12-27 HISTORY — PX: LUMBAR LAMINECTOMY/DECOMPRESSION MICRODISCECTOMY: SHX5026

## 2022-12-27 HISTORY — DX: Spinal stenosis, lumbar region without neurogenic claudication: M48.061

## 2022-12-27 SURGERY — LUMBAR LAMINECTOMY/DECOMPRESSION MICRODISCECTOMY 2 LEVELS
Anesthesia: General | Site: Back | Laterality: Bilateral

## 2022-12-27 MED ORDER — BUPIVACAINE HCL (PF) 0.25 % IJ SOLN
INTRAMUSCULAR | Status: AC
  Filled 2022-12-27: qty 30

## 2022-12-27 MED ORDER — ONDANSETRON HCL 4 MG/2ML IJ SOLN
INTRAMUSCULAR | Status: DC | PRN
  Administered 2022-12-27: 4 mg via INTRAVENOUS

## 2022-12-27 MED ORDER — BIOTENE DRY MOUTH MT LIQD
15.0000 mL | Freq: Two times a day (BID) | OROMUCOSAL | Status: DC
  Administered 2022-12-27: 15 mL via OROMUCOSAL

## 2022-12-27 MED ORDER — LORATADINE 10 MG PO TABS: 10.0000 mg | ORAL_TABLET | Freq: Every day | ORAL | Status: DC | PRN

## 2022-12-27 MED ORDER — HYDRALAZINE HCL 50 MG PO TABS
50.0000 mg | ORAL_TABLET | Freq: Two times a day (BID) | ORAL | Status: DC
  Administered 2022-12-27: 50 mg via ORAL
  Filled 2022-12-27 (×2): qty 1

## 2022-12-27 MED ORDER — CYCLOBENZAPRINE HCL 10 MG PO TABS: 10.0000 mg | ORAL_TABLET | Freq: Three times a day (TID) | ORAL | Status: DC | PRN

## 2022-12-27 MED ORDER — CEFAZOLIN SODIUM-DEXTROSE 2-4 GM/100ML-% IV SOLN
2.0000 g | Freq: Three times a day (TID) | INTRAVENOUS | Status: DC
  Administered 2022-12-27 – 2022-12-28 (×2): 2 g via INTRAVENOUS
  Filled 2022-12-27 (×2): qty 100

## 2022-12-27 MED ORDER — EPHEDRINE 5 MG/ML INJ
INTRAVENOUS | Status: AC
  Filled 2022-12-27: qty 5

## 2022-12-27 MED ORDER — HYDROMORPHONE HCL 1 MG/ML IJ SOLN: 0.5000 mg | INTRAMUSCULAR | Status: DC | PRN

## 2022-12-27 MED ORDER — ALBUTEROL SULFATE HFA 108 (90 BASE) MCG/ACT IN AERS: 2.0000 | INHALATION_SPRAY | Freq: Four times a day (QID) | RESPIRATORY_TRACT | Status: DC | PRN

## 2022-12-27 MED ORDER — SODIUM CHLORIDE 0.9% FLUSH: 3.0000 mL | INTRAVENOUS | Status: DC | PRN

## 2022-12-27 MED ORDER — ACETAMINOPHEN 500 MG PO TABS
1000.0000 mg | ORAL_TABLET | Freq: Once | ORAL | Status: AC
  Administered 2022-12-27: 1000 mg via ORAL
  Filled 2022-12-27: qty 2

## 2022-12-27 MED ORDER — CEFAZOLIN SODIUM-DEXTROSE 2-4 GM/100ML-% IV SOLN
2.0000 g | INTRAVENOUS | Status: AC
  Administered 2022-12-27: 2 g via INTRAVENOUS
  Filled 2022-12-27: qty 100

## 2022-12-27 MED ORDER — DEXAMETHASONE SODIUM PHOSPHATE 10 MG/ML IJ SOLN
INTRAMUSCULAR | Status: DC | PRN
  Administered 2022-12-27: 5 mg via INTRAVENOUS

## 2022-12-27 MED ORDER — CYCLOSPORINE 0.05 % OP EMUL
1.0000 [drp] | Freq: Two times a day (BID) | OPHTHALMIC | Status: DC
  Administered 2022-12-27: 1 [drp] via OPHTHALMIC
  Filled 2022-12-27 (×3): qty 30

## 2022-12-27 MED ORDER — CHLORHEXIDINE GLUCONATE 0.12 % MT SOLN
15.0000 mL | Freq: Once | OROMUCOSAL | Status: AC
  Administered 2022-12-27: 15 mL via OROMUCOSAL

## 2022-12-27 MED ORDER — RISAQUAD PO CAPS: 1.0000 | ORAL_CAPSULE | Freq: Every day | ORAL | Status: DC | PRN

## 2022-12-27 MED ORDER — SUGAMMADEX SODIUM 200 MG/2ML IV SOLN
INTRAVENOUS | Status: DC | PRN
  Administered 2022-12-27: 200 mg via INTRAVENOUS

## 2022-12-27 MED ORDER — THROMBIN (RECOMBINANT) 5000 UNITS EX SOLR: CUTANEOUS | Status: DC | PRN

## 2022-12-27 MED ORDER — ASPIRIN 81 MG PO TBEC
81.0000 mg | DELAYED_RELEASE_TABLET | Freq: Every day | ORAL | Status: DC
  Administered 2022-12-27: 81 mg via ORAL
  Filled 2022-12-27: qty 1

## 2022-12-27 MED ORDER — LEVOTHYROXINE SODIUM 75 MCG PO TABS
150.0000 ug | ORAL_TABLET | ORAL | Status: DC
  Administered 2022-12-28: 150 ug via ORAL
  Filled 2022-12-27: qty 2

## 2022-12-27 MED ORDER — GABAPENTIN 300 MG PO CAPS
300.0000 mg | ORAL_CAPSULE | Freq: Every day | ORAL | Status: DC
  Administered 2022-12-27: 300 mg via ORAL
  Filled 2022-12-27: qty 1

## 2022-12-27 MED ORDER — ACETAMINOPHEN 325 MG PO TABS
650.0000 mg | ORAL_TABLET | ORAL | Status: DC | PRN
  Administered 2022-12-28: 650 mg via ORAL
  Filled 2022-12-27: qty 2

## 2022-12-27 MED ORDER — MENTHOL 3 MG MT LOZG: 1.0000 | LOZENGE | OROMUCOSAL | Status: DC | PRN

## 2022-12-27 MED ORDER — ACETAMINOPHEN 650 MG RE SUPP: 650.0000 mg | RECTAL | Status: DC | PRN

## 2022-12-27 MED ORDER — ICY HOT 10-30 % EX STCK: Freq: Every day | CUTANEOUS | Status: DC

## 2022-12-27 MED ORDER — 0.9 % SODIUM CHLORIDE (POUR BTL) OPTIME
TOPICAL | Status: DC | PRN
  Administered 2022-12-27: 1000 mL

## 2022-12-27 MED ORDER — CHLORHEXIDINE GLUCONATE 0.12 % MT SOLN
OROMUCOSAL | Status: AC
  Filled 2022-12-27: qty 15

## 2022-12-27 MED ORDER — FLUTICASONE PROPIONATE 50 MCG/ACT NA SUSP: 1.0000 | Freq: Every day | NASAL | Status: DC | PRN

## 2022-12-27 MED ORDER — ALBUTEROL SULFATE (2.5 MG/3ML) 0.083% IN NEBU: 2.5000 mg | INHALATION_SOLUTION | Freq: Four times a day (QID) | RESPIRATORY_TRACT | Status: DC | PRN

## 2022-12-27 MED ORDER — ONDANSETRON HCL 4 MG/2ML IJ SOLN: 4.0000 mg | Freq: Once | INTRAMUSCULAR | Status: DC | PRN

## 2022-12-27 MED ORDER — MAGNESIUM OXIDE -MG SUPPLEMENT 400 (240 MG) MG PO TABS
400.0000 mg | ORAL_TABLET | Freq: Every day | ORAL | Status: DC
  Filled 2022-12-27: qty 1

## 2022-12-27 MED ORDER — SODIUM CHLORIDE 0.9 % IV SOLN: 250.0000 mL | INTRAVENOUS | Status: DC

## 2022-12-27 MED ORDER — PANTOPRAZOLE SODIUM 40 MG PO TBEC
40.0000 mg | DELAYED_RELEASE_TABLET | Freq: Every day | ORAL | Status: DC
  Administered 2022-12-27: 40 mg via ORAL
  Filled 2022-12-27: qty 1

## 2022-12-27 MED ORDER — EPHEDRINE SULFATE-NACL 50-0.9 MG/10ML-% IV SOSY
PREFILLED_SYRINGE | INTRAVENOUS | Status: DC | PRN
  Administered 2022-12-27 (×2): 5 mg via INTRAVENOUS

## 2022-12-27 MED ORDER — POLYVINYL ALCOHOL 1.4 % OP SOLN: 1.0000 [drp] | Freq: Every day | OPHTHALMIC | Status: DC | PRN

## 2022-12-27 MED ORDER — PHENYLEPHRINE HCL-NACL 20-0.9 MG/250ML-% IV SOLN
INTRAVENOUS | Status: DC | PRN
  Administered 2022-12-27: 30 ug/min via INTRAVENOUS

## 2022-12-27 MED ORDER — ROCURONIUM BROMIDE 10 MG/ML (PF) SYRINGE
PREFILLED_SYRINGE | INTRAVENOUS | Status: DC | PRN
  Administered 2022-12-27: 10 mg via INTRAVENOUS
  Administered 2022-12-27: 50 mg via INTRAVENOUS

## 2022-12-27 MED ORDER — ORAL CARE MOUTH RINSE: 15.0000 mL | Freq: Once | OROMUCOSAL | Status: AC

## 2022-12-27 MED ORDER — LIDOCAINE-EPINEPHRINE 1 %-1:100000 IJ SOLN
INTRAMUSCULAR | Status: AC
  Filled 2022-12-27: qty 1

## 2022-12-27 MED ORDER — HYDROCODONE-ACETAMINOPHEN 5-325 MG PO TABS
1.0000 | ORAL_TABLET | ORAL | Status: DC | PRN
  Administered 2022-12-28: 1 via ORAL
  Filled 2022-12-27: qty 1

## 2022-12-27 MED ORDER — THROMBIN 5000 UNITS EX SOLR
CUTANEOUS | Status: AC
  Filled 2022-12-27: qty 10000

## 2022-12-27 MED ORDER — PROPOFOL 10 MG/ML IV BOLUS
INTRAVENOUS | Status: DC | PRN
  Administered 2022-12-27: 90 mg via INTRAVENOUS

## 2022-12-27 MED ORDER — FENTANYL CITRATE (PF) 250 MCG/5ML IJ SOLN
INTRAMUSCULAR | Status: AC
  Filled 2022-12-27: qty 5

## 2022-12-27 MED ORDER — LIDOCAINE-EPINEPHRINE 1 %-1:100000 IJ SOLN
INTRAMUSCULAR | Status: DC | PRN
  Administered 2022-12-27: 10 mL

## 2022-12-27 MED ORDER — NITROGLYCERIN 0.4 MG SL SUBL: 0.4000 mg | SUBLINGUAL_TABLET | SUBLINGUAL | Status: DC | PRN

## 2022-12-27 MED ORDER — LEVOTHYROXINE SODIUM 75 MCG PO TABS: 75.0000 ug | ORAL_TABLET | ORAL | Status: DC

## 2022-12-27 MED ORDER — LACTATED RINGERS IV SOLN: INTRAVENOUS | Status: DC

## 2022-12-27 MED ORDER — HYDROCODONE-ACETAMINOPHEN 5-325 MG PO TABS
2.0000 | ORAL_TABLET | ORAL | Status: DC | PRN
  Administered 2022-12-27: 2 via ORAL
  Filled 2022-12-27: qty 2

## 2022-12-27 MED ORDER — LOSARTAN POTASSIUM 50 MG PO TABS
100.0000 mg | ORAL_TABLET | Freq: Every day | ORAL | Status: DC
  Administered 2022-12-27: 100 mg via ORAL
  Filled 2022-12-27: qty 2

## 2022-12-27 MED ORDER — MAGNESIUM 200 MG PO TABS
400.0000 mg | ORAL_TABLET | Freq: Every day | ORAL | Status: DC
  Filled 2022-12-27: qty 2

## 2022-12-27 MED ORDER — ACETAMINOPHEN 500 MG PO TABS
500.0000 mg | ORAL_TABLET | Freq: Two times a day (BID) | ORAL | Status: DC
  Administered 2022-12-27 – 2022-12-28 (×2): 500 mg via ORAL
  Filled 2022-12-27 (×2): qty 1

## 2022-12-27 MED ORDER — LANCETS MISC: 1.0000 | Freq: Two times a day (BID) | Status: DC

## 2022-12-27 MED ORDER — AMLODIPINE BESYLATE 5 MG PO TABS
5.0000 mg | ORAL_TABLET | Freq: Every day | ORAL | Status: DC
  Filled 2022-12-27: qty 1

## 2022-12-27 MED ORDER — BUPIVACAINE HCL (PF) 0.25 % IJ SOLN
INTRAMUSCULAR | Status: DC | PRN
  Administered 2022-12-27: 10 mL

## 2022-12-27 MED ORDER — LIDOCAINE 2% (20 MG/ML) 5 ML SYRINGE
INTRAMUSCULAR | Status: DC | PRN
  Administered 2022-12-27: 80 mg via INTRAVENOUS

## 2022-12-27 MED ORDER — SODIUM CHLORIDE 0.9% FLUSH: 3.0000 mL | Freq: Two times a day (BID) | INTRAVENOUS | Status: DC

## 2022-12-27 MED ORDER — OCUVITE-LUTEIN PO CAPS: 1.0000 | ORAL_CAPSULE | Freq: Every day | ORAL | Status: DC

## 2022-12-27 MED ORDER — FERROUS SULFATE 325 (65 FE) MG PO TABS
325.0000 mg | ORAL_TABLET | Freq: Every day | ORAL | Status: DC
  Filled 2022-12-27: qty 1

## 2022-12-27 MED ORDER — CHLORHEXIDINE GLUCONATE CLOTH 2 % EX PADS: 6.0000 | MEDICATED_PAD | Freq: Once | CUTANEOUS | Status: DC

## 2022-12-27 MED ORDER — PHENOL 1.4 % MT LIQD: 1.0000 | OROMUCOSAL | Status: DC | PRN

## 2022-12-27 MED ORDER — CYANOCOBALAMIN 500 MCG PO TABS
500.0000 ug | ORAL_TABLET | Freq: Every morning | ORAL | Status: DC
  Administered 2022-12-28: 500 ug via ORAL
  Filled 2022-12-27: qty 1

## 2022-12-27 MED ORDER — POLYETHYLENE GLYCOL 3350 17 G PO PACK
17.0000 g | PACK | Freq: Every day | ORAL | Status: DC | PRN
  Administered 2022-12-28: 17 g via ORAL
  Filled 2022-12-27: qty 1

## 2022-12-27 MED ORDER — MIRABEGRON ER 25 MG PO TB24
25.0000 mg | ORAL_TABLET | Freq: Every day | ORAL | Status: DC
  Filled 2022-12-27: qty 1

## 2022-12-27 MED ORDER — FENTANYL CITRATE (PF) 250 MCG/5ML IJ SOLN
INTRAMUSCULAR | Status: DC | PRN
  Administered 2022-12-27: 50 ug via INTRAVENOUS

## 2022-12-27 MED ORDER — ALLOPURINOL 100 MG PO TABS
100.0000 mg | ORAL_TABLET | Freq: Two times a day (BID) | ORAL | Status: DC
  Administered 2022-12-27: 100 mg via ORAL
  Filled 2022-12-27 (×2): qty 1

## 2022-12-27 MED ORDER — ADULT MULTIVITAMIN W/MINERALS CH
1.0000 | ORAL_TABLET | Freq: Every day | ORAL | Status: DC
  Filled 2022-12-27: qty 1

## 2022-12-27 MED ORDER — FENTANYL CITRATE (PF) 100 MCG/2ML IJ SOLN: 25.0000 ug | INTRAMUSCULAR | Status: DC | PRN

## 2022-12-27 MED ORDER — ALUM & MAG HYDROXIDE-SIMETH 200-200-20 MG/5ML PO SUSP: 30.0000 mL | Freq: Four times a day (QID) | ORAL | Status: DC | PRN

## 2022-12-27 MED ORDER — PROPOFOL 10 MG/ML IV BOLUS
INTRAVENOUS | Status: AC
  Filled 2022-12-27: qty 20

## 2022-12-27 MED ORDER — ROSUVASTATIN CALCIUM 5 MG PO TABS
5.0000 mg | ORAL_TABLET | Freq: Every day | ORAL | Status: DC
  Administered 2022-12-27: 5 mg via ORAL
  Filled 2022-12-27: qty 1

## 2022-12-27 MED ORDER — ONDANSETRON HCL 4 MG PO TABS: 4.0000 mg | ORAL_TABLET | Freq: Four times a day (QID) | ORAL | Status: DC | PRN

## 2022-12-27 MED ORDER — ONDANSETRON HCL 4 MG/2ML IJ SOLN
4.0000 mg | Freq: Four times a day (QID) | INTRAMUSCULAR | Status: DC | PRN
  Administered 2022-12-28: 4 mg via INTRAVENOUS
  Filled 2022-12-27: qty 2

## 2022-12-27 MED ORDER — GLYCOPYRROLATE PF 0.2 MG/ML IJ SOSY
PREFILLED_SYRINGE | INTRAMUSCULAR | Status: DC | PRN
  Administered 2022-12-27: .1 mg via INTRAVENOUS

## 2022-12-27 MED ORDER — PANTOPRAZOLE SODIUM 40 MG IV SOLR: 40.0000 mg | Freq: Every day | INTRAVENOUS | Status: DC

## 2022-12-27 SURGICAL SUPPLY — 52 items
ADH SKN CLS APL DERMABOND .7 (GAUZE/BANDAGES/DRESSINGS) ×1
APL SKNCLS STERI-STRIP NONHPOA (GAUZE/BANDAGES/DRESSINGS) ×1
BAG COUNTER SPONGE SURGICOUNT (BAG) ×1 IMPLANT
BAG SPNG CNTER NS LX DISP (BAG) ×1
BAND INSRT 18 STRL LF DISP RB (MISCELLANEOUS) ×2
BAND RUBBER #18 3X1/16 STRL (MISCELLANEOUS) ×2 IMPLANT
BENZOIN TINCTURE PRP APPL 2/3 (GAUZE/BANDAGES/DRESSINGS) ×1 IMPLANT
BLADE CLIPPER SURG (BLADE) IMPLANT
BLADE SURG 11 STRL SS (BLADE) ×1 IMPLANT
BUR CUTTER 7.0 ROUND (BURR) ×1 IMPLANT
BUR MATCHSTICK NEURO 3.0 LAGG (BURR) ×1 IMPLANT
CANISTER SUCT 3000ML PPV (MISCELLANEOUS) ×1 IMPLANT
DERMABOND ADVANCED .7 DNX12 (GAUZE/BANDAGES/DRESSINGS) ×1 IMPLANT
DRAPE HALF SHEET 40X57 (DRAPES) IMPLANT
DRAPE LAPAROTOMY 100X72X124 (DRAPES) ×1 IMPLANT
DRAPE MICROSCOPE SLANT 54X150 (MISCELLANEOUS) ×1 IMPLANT
DRAPE SURG 17X23 STRL (DRAPES) ×1 IMPLANT
DRSG OPSITE POSTOP 4X6 (GAUZE/BANDAGES/DRESSINGS) IMPLANT
DURAPREP 26ML APPLICATOR (WOUND CARE) ×1 IMPLANT
ELECT REM PT RETURN 9FT ADLT (ELECTROSURGICAL) ×1
ELECTRODE REM PT RTRN 9FT ADLT (ELECTROSURGICAL) ×1 IMPLANT
GAUZE 4X4 16PLY ~~LOC~~+RFID DBL (SPONGE) IMPLANT
GAUZE SPONGE 4X4 12PLY STRL (GAUZE/BANDAGES/DRESSINGS) ×1 IMPLANT
GLOVE BIO SURGEON STRL SZ7 (GLOVE) IMPLANT
GLOVE BIO SURGEON STRL SZ8 (GLOVE) ×1 IMPLANT
GLOVE BIOGEL PI IND STRL 7.0 (GLOVE) IMPLANT
GLOVE EXAM NITRILE XL STR (GLOVE) IMPLANT
GLOVE INDICATOR 8.5 STRL (GLOVE) ×2 IMPLANT
GOWN STRL REUS W/ TWL LRG LVL3 (GOWN DISPOSABLE) ×1 IMPLANT
GOWN STRL REUS W/ TWL XL LVL3 (GOWN DISPOSABLE) ×2 IMPLANT
GOWN STRL REUS W/TWL 2XL LVL3 (GOWN DISPOSABLE) IMPLANT
GOWN STRL REUS W/TWL LRG LVL3 (GOWN DISPOSABLE) ×1
GOWN STRL REUS W/TWL XL LVL3 (GOWN DISPOSABLE) ×2
KIT BASIN OR (CUSTOM PROCEDURE TRAY) ×1 IMPLANT
KIT TURNOVER KIT B (KITS) ×1 IMPLANT
NDL SPNL 22GX3.5 QUINCKE BK (NEEDLE) ×1 IMPLANT
NEEDLE HYPO 22GX1.5 SAFETY (NEEDLE) ×1 IMPLANT
NEEDLE SPNL 22GX3.5 QUINCKE BK (NEEDLE) ×1 IMPLANT
NS IRRIG 1000ML POUR BTL (IV SOLUTION) ×1 IMPLANT
PACK LAMINECTOMY NEURO (CUSTOM PROCEDURE TRAY) ×1 IMPLANT
SOL ELECTROSURG ANTI STICK (MISCELLANEOUS) ×1
SOLUTION ELECTROSURG ANTI STCK (MISCELLANEOUS) ×1 IMPLANT
SPIKE FLUID TRANSFER (MISCELLANEOUS) ×1 IMPLANT
SPONGE SURGIFOAM ABS GEL SZ50 (HEMOSTASIS) ×1 IMPLANT
STRIP CLOSURE SKIN 1/2X4 (GAUZE/BANDAGES/DRESSINGS) ×1 IMPLANT
SUT VIC AB 0 CT1 18XCR BRD8 (SUTURE) ×1 IMPLANT
SUT VIC AB 0 CT1 8-18 (SUTURE) ×1
SUT VIC AB 2-0 CT1 18 (SUTURE) ×1 IMPLANT
SUT VICRYL 4-0 PS2 18IN ABS (SUTURE) ×1 IMPLANT
TOWEL GREEN STERILE (TOWEL DISPOSABLE) ×1 IMPLANT
TOWEL GREEN STERILE FF (TOWEL DISPOSABLE) ×1 IMPLANT
WATER STERILE IRR 1000ML POUR (IV SOLUTION) ×1 IMPLANT

## 2022-12-27 NOTE — Progress Notes (Signed)
Pt reports he took his Nitroglycerin about a week ago for pain radiating from right shoulder across chest. Pt reports the pain went away on its own, but he still took a Nitroglycerin. Dr. Desmond Lope notified.

## 2022-12-27 NOTE — Transfer of Care (Signed)
Immediate Anesthesia Transfer of Care Note  Patient: [name] Pratt  Procedure(s) Performed: Lumbar Two-Three, Lumbar Three-Four Sublaminar Decompression (Bilateral: Back)  Patient Location: PACU  Anesthesia Type:General  Level of Consciousness: drowsy and patient cooperative  Airway & Oxygen Therapy: Patient Spontanous Breathing and Patient connected to nasal cannula oxygen  Post-op Assessment: Report given to RN, Post -op Vital signs reviewed and stable, and Patient moving all extremities X 4  Post vital signs: Reviewed and stable  Last Vitals:  Vitals Value Taken Time  BP 143/65 12/27/22 1351  Temp    Pulse 65 12/27/22 1353  Resp 12 12/27/22 1353  SpO2 98 % 12/27/22 1353  Vitals shown include unvalidated device data.  Last Pain:  Vitals:   12/27/22 0951  PainSc: 0-No pain         Complications: No notable events documented.

## 2022-12-27 NOTE — Op Note (Signed)
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  Postoperative diagnosis: Same.  Procedure: Bilateral decompressive laminectomies L2-3 L3-4 with foraminotomies of the L2, L3, and L4 nerve roots bilaterally medial facetectomies  Surgeon: Jillyn Hidden Undine Nealis  Assistant: Julien Girt.  Anesthesia: General.  EBL: Minimal.  HPI: 82 year old gentleman with back pain bilateral hip and leg pain and neurogenic claudication workup revealed severe spinal stenosis at L2-3 and L3-4.  Due to patient's progression of clinical syndrome imaging findings of a conservative treatment I recommended decompressive laminectomies at those levels.  I extensively reviewed the risks and benefits of the operation with him as well as perioperative course expectations of outcome and alternatives to surgery and he understood and agreed to proceed forward.  Operative procedure: Patient was brought into the OR was induced under general anesthesia positioned prone the Wilson frame his back was prepped and draped in routine sterile fashion.  Preoperative x-ray localized the appropriate level so after infiltration of 10 cc lidocaine with epi midline incision was made and Bovie electrocautery was used to take down the subcutaneous tissue and subperiosteal dissection was carried lamina of L2-L3 and L4 bilaterally.  Interoperative x-ray confirmed application appropriate level so at this point the spinous process at L3 was removed the inferior half of the spinous process at L2 and superior half of the spinous process at L4 central decompression was begun high-speed drill thinned out the lamina of L3 L2 and L4 and central decompression was begun with a 3 Miller Kerrison punch.  There was marked hourglass compression large medially projecting spurs at both 2 3 and 3 4 these were causing severe compression of thecal sac and foraminal stenosis so I utilizing Penfield 4 this was dissected off of  the dura and the medial facet complexes were under Bitton marching down the gutters from L2 down to L4 decompressing the lateral canal performing foraminotomies of the L2, L3, and L4 nerve roots.  After the end discectomy I inspected the disc base bilaterally at L2-3 and L3-4 was felt to be mostly spondylitic and not significantly ruptured or compressive so I left it alone.  The wound was then copiously irrigated meticulous hemostasis was maintained I was easily able to pass a coronary dilator out all the foramina L2, L3, and L4 bilaterally I then put Gelfoam and top of the dura placed a medium Hemovac drain and injected Marcaine in the fascia and closed the wound in layers with interrupted Vicryl and a running 4 subcuticular.  My assistant Julien Girt was there during the entirety of the case helping to assist in performing the foraminotomies and decompression at all levels.  At the end the case all needle count sponge counts were correct

## 2022-12-27 NOTE — Progress Notes (Signed)
Pt refused cpap for the night.  

## 2022-12-27 NOTE — Anesthesia Procedure Notes (Signed)
Procedure Name: Intubation Date/Time: 12/27/2022 12:02 PM  Performed by: Randon Goldsmith, CRNAPre-anesthesia Checklist: Patient identified, Emergency Drugs available, Patient being monitored, Suction available and Timeout performed Patient Re-evaluated:Patient Re-evaluated prior to induction Oxygen Delivery Method: Circle system utilized Preoxygenation: Pre-oxygenation with 100% oxygen Induction Type: IV induction Ventilation: Mask ventilation without difficulty Laryngoscope Size: Glidescope and 4 Grade View: Grade I Tube type: Oral Tube size: 7.5 mm Number of attempts: 1 Airway Equipment and Method: Video-laryngoscopy and Stylet Placement Confirmation: ETT inserted through vocal cords under direct vision, positive ETCO2 and breath sounds checked- equal and bilateral Secured at: 21 cm Tube secured with: Tape Dental Injury: Teeth and Oropharynx as per pre-operative assessment

## 2022-12-27 NOTE — Anesthesia Postprocedure Evaluation (Signed)
Anesthesia Post Note  Patient: [name] Pratt  Procedure(s) Performed: Lumbar Two-Three, Lumbar Three-Four Sublaminar Decompression (Bilateral: Back)     Patient location during evaluation: PACU Anesthesia Type: General Level of consciousness: awake and alert Pain management: pain level controlled Vital Signs Assessment: post-procedure vital signs reviewed and stable Respiratory status: spontaneous breathing, nonlabored ventilation, respiratory function stable and patient connected to nasal cannula oxygen Cardiovascular status: blood pressure returned to baseline and stable Postop Assessment: no apparent nausea or vomiting Anesthetic complications: no   No notable events documented.  Last Vitals:  Vitals:   12/27/22 1545 12/27/22 1600  BP: (!) 143/69 (!) 148/63  Pulse: (!) 58 (!) 56  Resp: 13 13  Temp:    SpO2: 96% 94%    Last Pain:  Vitals:   12/27/22 1600  PainSc: 0-No pain                 Collene Schlichter

## 2022-12-27 NOTE — H&P (Signed)
[name] Pratt is an 82 y.o. male.   Chief Complaint: Back bilateral hip and leg pain claudication HPI: 82 year old gentleman with bilateral hip and leg pain rating down L3-L4 nerve root pattern workup revealed severe spinal stenosis at L2-3 with a large spur on the right at L2-3 and possible disc bulge on the left 3 4 but due to his progression of clinical syndrome imaging findings of a conservative treatment I recommended bilateral decompressive laminectomy with possible microdiscectomy L3-4 on the left.  I have extensively gone over the risks and benefits of the operation with him as well as perioperative course expectations of outcome and alternatives of surgery and he understands and agrees to proceed forward.  Past Medical History:  Diagnosis Date   Allergic rhinitis 02/02/2016   Anemia 12/17/2009   Formatting of this note might be different from the original. 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 Hypertension   Benign paroxysmal positional vertigo 09/14/2021   Bilateral sacroiliitis 07/22/2021   Body mass index (BMI) 25.0-25.9, adult 11/02/2020   Cancer    skin cancer   Cardiac murmur 07/25/2021   Carotid stenosis    a. Carotid U/S 5/13: LICA < 50%, RICA 50-69%;  b.  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   Cystoid macular edema of both eyes 07/28/2020    Degenerative disc disease, lumbar 06/30/2015   Deviated nasal septum 02/18/2015   Diverticulosis 03/03/2020   DJD (degenerative joint disease) of hip 12/24/2012   Dyspnea 06/12/2011   Emphysema, unspecified 03/05/2020   Erectile dysfunction 06/20/2017   Exudative age-related macular degeneration of right eye with active choroidal neovascularization 02/09/2021   Gait disorder 03/02/2021   GERD (gastroesophageal reflux disease)    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   Qualifier: Diagnosis of  By: Kem Parkinson     Greater trochanteric pain syndrome 07/22/2021   Hand pain, left 01/16/2022   Hearing loss 02/02/2016   Heme positive stool 02/29/2020   HIATAL HERNIA 05/13/2009   Qualifier: Diagnosis of  By: Kem Parkinson     High risk medication use 01/18/2012   Hip pain 03/02/2021   History of chicken pox    History of COVID-19 12/05/2021   History of hemorrhoids    History of kidney stones    History of skin cancer 07/22/2021   skin cancer   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 Cardiology - Dr Estrella Myrtle at [facility] cardiology  ICD-10 cut over   Hypertension    under control   Hypokalemia 07/08/2021   Hypothyroidism    Internal hemorrhoids 03/03/2020   Leg cramps 11/13/2019   Lower GI bleed 12/12/2012   Lumbago of lumbar region with sciatica 10/21/2020   Lumbar radiculopathy 03/02/2021   Lumbar spondylosis  11/02/2020   NEPHROLITHIASIS 05/13/2009   Qualifier: Diagnosis of  By: Kem Parkinson     Nonspecific abnormal electrocardiogram (ECG) (EKG) 01/06/2013   Numbness of hand 01/24/2022   Onychomycosis 06/30/2015   Osteoarthrosis, 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 Osteoarthritis Of The Hand  10/1 IMO update   Osteoarthrosis, unspecified whether generalized or localized, pelvic  region and thigh 07/25/2010   Formatting of this note might be different from the original. Osteoarthritis Of The Hip   Other abnormal glucose 07/22/2021   Other allergic rhinitis 02/18/2015   Palpitations 07/25/2021   Peripheral neuropathy 06/19/2016   Preventative health care 11/24/2015   Right groin pain 11/29/2012   Right inguinal hernia 05/13/2009   Qualifier: Diagnosis of  By: Kem Parkinson     RLS (restless legs syndrome) 02/18/2015   Rosacea    Rotator cuff tear 07/27/2013   Situational depression 03/02/2021   Skin lesion 01/16/2022   Sleep apnea with use of continuous positive airway pressure (CPAP) 07/15/2013   CPAP set to  7 cm water,  Residual AHi was 7.8  With no leak.  User time 6 hours.  479 days , not used only 12 days - highly compliant 06-23-13  .    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 Fawn Kirk   Ulnar neuropathy 01/24/2022   Urinary retention 11/26/2020   Ventral hernia 07/08/2012   Vertigo 07/08/2021   Weight loss 03/03/2020    Past Surgical History:  Procedure Laterality Date   ABDOMINAL HERNIA REPAIR  07/15/12   Dr Ashley Jacobs   ANTERIOR CERVICAL DECOMP/DISCECTOMY FUSION N/A 07/09/2020   Procedure: ANTERIOR CERVICAL DECOMPRESSION AND FUSION CERVICAL THREE-FOUR.;  Surgeon: Donalee Citrin, MD;  Location: [facility] OR;  Service: Neurosurgery;  Laterality: N/A;  anterior   BROW LIFT  05/07/01   COLONOSCOPY WITH PROPOFOL N/A 04/24/2022   Procedure: COLONOSCOPY WITH PROPOFOL;  Surgeon: Tressia Danas, MD;  Location: [facility] ENDOSCOPY;  Service: Gastroenterology;  Laterality: N/A;   COLONOSCOPY WITH PROPOFOL N/A 04/26/2022   Procedure: COLONOSCOPY WITH PROPOFOL;  Surgeon: Tressia Danas, MD;   Location: [facility] ENDOSCOPY;  Service: Gastroenterology;  Laterality: N/A;   CYSTOSCOPY WITH URETEROSCOPY AND STENT PLACEMENT Right 01/28/2014   Procedure: CYSTOSCOPY WITH URETEROSCOPY, BASKET RETRIVAL AND  STENT PLACEMENT;  Surgeon: Valetta Fuller, MD;  Location: [facility] ORS;  Service: Urology;  Laterality: Right;   epidural injections     multiple procedures   ESOPHAGEAL DILATION  1993 and 1994   multiple times   EYE SURGERY Bilateral 03/25/10, 2012   cataract removal   HEMORRHOID SURGERY  2014   HIATAL HERNIA REPAIR  12/21/93   HOLMIUM LASER APPLICATION Right 01/28/2014   Procedure: HOLMIUM LASER APPLICATION;  Surgeon: Valetta Fuller, MD;  Location: [facility] ORS;  Service: Urology;  Laterality: Right;   INGUINAL HERNIA REPAIR Right 07/15/12   Dr Ashley Jacobs, x2   JOINT REPLACEMENT  07/25/04   right knee   JOINT REPLACEMENT  07/13/10   left knee   KNEE SURGERY  1995   LAPAROSCOPIC CHOLECYSTECTOMY  09/20/06   with intraoperative cholangiogram and right inguinal herniorrhaphy with mesh    LIGAMENT REPAIR Left 07/2013   shoulder   LITHOTRIPSY  x2   NASAL SEPTUM SURGERY  1975   POLYPECTOMY  04/26/2022   Procedure: POLYPECTOMY;  Surgeon: Tressia Danas, MD;  Location: [facility] ENDOSCOPY;  Service: Gastroenterology;;   POSTERIOR CERVICAL FUSION/FORAMINOTOMY N/A 11/24/2020   Procedure: Posterior Cervical Laminectomy Cervical three-four, Cervical four-five with lateral mass fusion;  Surgeon: Donalee Citrin, MD;  Location: [facility] OR;  Service: Neurosurgery;  Laterality: N/A;   REFRACTIVE SURGERY Bilateral    Right total hip replacement  4/14   SKIN CANCER DESTRUCTION     nose, ear   TONSILLECTOMY  as child   TOTAL HIP ARTHROPLASTY Left    URETHRAL DILATION  1991   Dr. Annabell Howells    Family History  Problem Relation Age of Onset   Cancer Mother    Hypertension Other    Cancer Other    Osteoarthritis Other    Stomach cancer Neg Hx    Colon cancer Neg Hx    Esophageal cancer Neg Hx    Social History:  reports that he  quit smoking about 44 years ago. His smoking use included cigarettes. He has never used smokeless tobacco. He reports that he does not currently use alcohol. He reports that he does not use drugs.  Allergies:  Allergies  Allergen Reactions   Pravastatin Other (See Comments)    Muscle cramps   Sildenafil Other (See Comments)    Tachycardia     Oxycodone Other (See Comments)    Hallucinations    Atenolol Other (See Comments)    Bradycardia    Elastic Bandages & [Zinc] Other (See Comments)    Teflon bandages - Irritation   Metoclopramide Hcl Anxiety    Medications Prior to Admission  Medication Sig Dispense Refill   acetaminophen (TYLENOL) 500 MG tablet Take 500 mg by mouth 2 (two) times daily.     albuterol (VENTOLIN HFA) 108 (90 Base) MCG/ACT inhaler INHALE 2 PUFFS INTO THE LUNGS EVERY 6 HOURS AS NEEDED FOR SHORTNESS OF BREATH. (Patient taking differently: Inhale 2 puffs into the lungs every 6 (six) hours as needed for shortness of breath.) 1 each 3   allopurinol (ZYLOPRIM) 100 MG tablet Take 1 tablet (100 mg total) by mouth 2 (two) times daily. 180 tablet 1   amLODipine (NORVASC) 5 MG tablet Take 1 tablet (5 mg total) by mouth daily. 90 tablet 1   antiseptic oral rinse (BIOTENE) LIQD 15 mLs by Mouth Rinse route 2 (two) times daily.     aspirin EC 81 MG tablet Take 81 mg by mouth at bedtime.     bacitracin ointment Apply 1 Application topically 2 (two) times daily. (Patient taking differently: Apply 1 Application topically daily as needed for wound care.) 120 g 0   cholestyramine (QUESTRAN) 4 GM/DOSE powder Take by mouth daily. 30 ml     cycloSPORINE (RESTASIS) 0.05 % ophthalmic emulsion Place 1 drop into both eyes 2 (two) times daily.     docusate sodium (COLACE) 100 MG capsule Take 1 capsule (100 mg total) by mouth 2 (two) times daily. (Patient taking differently: Take 100 mg by mouth daily as needed for mild constipation or moderate constipation.) 60 capsule 11   fluticasone  (FLONASE) 50 MCG/ACT nasal spray Place 1 spray into both nostrils daily as needed for allergies.     gabapentin (NEURONTIN) 300 MG capsule Take 300 mg by mouth at bedtime.     GEMTESA 75 MG TABS Take 75 mg by mouth daily.     hydrALAZINE (APRESOLINE) 50 MG tablet Take 1 tablet (50  mg total) by mouth 2 (two) times daily. 180 tablet 1   Iron, Ferrous Sulfate, 325 (65 Fe) MG TABS Take 325 mg by mouth every other day. (Patient taking differently: Take 325 mg by mouth daily.) 30 tablet 0   levothyroxine (SYNTHROID) 150 MCG tablet TAKE 1 TABLET ON 6 DAYS PER WEEK (TAKE 75 MCG ONE TIME PER WEEK) 78 tablet 1   levothyroxine (SYNTHROID) 75 MCG tablet Take 75 mcg once a week (patient on 150 mcg 6 days a week) Dispense #24 for 90 day supply. (Patient taking differently: Take 75 mcg once a week (patient on 150 mcg 6 days a week) Dispense #24 for 90 day supply. Sunday) 12 tablet 1   loratadine (CLARITIN) 10 MG tablet Take 10 mg by mouth daily as needed for allergies or rhinitis.     losartan (COZAAR) 100 MG tablet TAKE 1 TABLET EVERY DAY (Patient taking differently: Take 100 mg by mouth at bedtime.) 90 tablet 1   Magnesium 500 MG TABS Take 500 mg by mouth daily.     Menthol, Topical Analgesic, (ICY HOT EX) Apply 1 Application topically at bedtime. Lidocaine / on back     Multiple Vitamins-Minerals (MULTIVITAMIN WITH MINERALS) tablet Take 1 tablet by mouth daily.     multivitamin-lutein (OCUVITE-LUTEIN) CAPS capsule Take 1 capsule by mouth daily.     nitroGLYCERIN (NITROSTAT) 0.4 MG SL tablet Place 1 tablet (0.4 mg total) under the tongue every 5 (five) minutes as needed for chest pain. 25 tablet 6   OVER THE COUNTER MEDICATION Place 1 spray into both nostrils 2 (two) times daily. Sinus saline     OVER THE COUNTER MEDICATION Take 15 mLs by mouth at bedtime. Yellow Mustard     polyethylene glycol (MIRALAX / GLYCOLAX) 17 g packet Take 17-34 g by mouth daily as needed for mild constipation.     polyvinyl alcohol  (ARTIFICIAL TEARS) 1.4 % ophthalmic solution Place 1 drop into both eyes daily as needed for dry eyes.     PREBIOTIC PRODUCT PO Take 1 tablet by mouth daily.     PRESCRIPTION MEDICATION CPAP- At bedtime and during any time of rest     Probiotic Product (PROBIOTIC DAILY PO) Take 1 capsule by mouth daily as needed (Constipation).     rosuvastatin (CRESTOR) 5 MG tablet Take 1 tablet (5 mg total) by mouth daily. (Patient taking differently: Take 5 mg by mouth at bedtime.) 90 tablet 1   vitamin B-12 (CYANOCOBALAMIN) 500 MCG tablet Take 500 mcg by mouth in the morning.     glucose blood test strip TRUE Metrix Blood Glucose Test Strip, use as instructed 100 each 12   Lancets MISC 1 each by Does not apply route 2 (two) times daily. TRUE matrix lancets 100 each 3   metroNIDAZOLE (METROCREAM) 0.75 % cream Apply 1 Application topically daily as needed (head).      No results found. However, due to the size of the patient record, not all encounters were searched. Please check Results Review for a complete set of results. No results found.  Review of Systems  Musculoskeletal:  Positive for back pain.  Neurological:  Positive for weakness and numbness.    Blood pressure (!) 151/72, pulse 67, temperature 97.9 F (36.6 C), resp. rate 20, height 5\' 6"  (1.676 m), weight 72.1 kg, SpO2 97 %. Physical Exam HENT:     Head: Normocephalic.     Right Ear: Tympanic membrane normal.     Nose: Nose normal.  Mouth/Throat:     Mouth: Mucous membranes are moist.  Eyes:     Pupils: Pupils are equal, round, and reactive to light.  Cardiovascular:     Rate and Rhythm: Normal rate.  Abdominal:     General: Abdomen is flat.  Musculoskeletal:        General: Normal range of motion.  Skin:    General: Skin is warm.  Neurological:     Mental Status: He is alert.     Comments: Strength 5 out of 5 iliopsoas, quads, hamstrings, gastroc, into tibialis, and EHL.      Assessment/Plan 82 year old presents for  decompressive laminectomy L2-3 L3-4  Mariam Dollar, MD 12/27/2022, 11:15 AM

## 2022-12-28 ENCOUNTER — Encounter (HOSPITAL_COMMUNITY): Payer: Self-pay | Admitting: Neurosurgery

## 2022-12-28 DIAGNOSIS — M48062 Spinal stenosis, lumbar region with neurogenic claudication: Secondary | ICD-10-CM | POA: Diagnosis not present

## 2022-12-28 DIAGNOSIS — G4730 Sleep apnea, unspecified: Secondary | ICD-10-CM | POA: Diagnosis not present

## 2022-12-28 DIAGNOSIS — I10 Essential (primary) hypertension: Secondary | ICD-10-CM | POA: Diagnosis not present

## 2022-12-28 DIAGNOSIS — R2689 Other abnormalities of gait and mobility: Secondary | ICD-10-CM | POA: Diagnosis not present

## 2022-12-28 DIAGNOSIS — M5416 Radiculopathy, lumbar region: Secondary | ICD-10-CM | POA: Diagnosis not present

## 2022-12-28 DIAGNOSIS — I739 Peripheral vascular disease, unspecified: Secondary | ICD-10-CM | POA: Diagnosis not present

## 2022-12-28 DIAGNOSIS — J449 Chronic obstructive pulmonary disease, unspecified: Secondary | ICD-10-CM | POA: Diagnosis not present

## 2022-12-28 DIAGNOSIS — K219 Gastro-esophageal reflux disease without esophagitis: Secondary | ICD-10-CM | POA: Diagnosis not present

## 2022-12-28 DIAGNOSIS — E039 Hypothyroidism, unspecified: Secondary | ICD-10-CM | POA: Diagnosis not present

## 2022-12-28 MED FILL — Thrombin For Soln 5000 Unit: CUTANEOUS | Qty: 2 | Status: AC

## 2022-12-28 NOTE — Evaluation (Signed)
Physical Therapy Evaluation  Patient Details Name: [name] Pratt MRN: [id number] DOB: March 25, 1941 Today's Date: 12/28/2022  History of Present Illness  Pt is an 82 y/o admitted for planned Bilateral decompressive laminectomies L2-3 L3-4. 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   Clinical Impression  Pt admitted with above diagnosis. At the time of PT eval, pt was able to demonstrate transfers and ambulation with gross min guard assist and RW for support. Pt was educated on precautions, appropriate activity progression, and car transfer. Pt currently with functional limitations due to the deficits listed below (see PT Problem List). Pt will benefit from skilled PT to increase their independence and safety with mobility to allow discharge to the venue listed below.         Recommendations for follow up therapy are one component of a multi-disciplinary discharge planning process, led by the attending physician.  Recommendations may be updated based on patient status, additional functional criteria and insurance authorization.  Follow Up Recommendations       Assistance Recommended at Discharge Frequent or constant Supervision/Assistance  Patient can return home with the following  A little help with walking and/or transfers;A little help with bathing/dressing/bathroom;Assistance with cooking/housework;Assist for transportation;Help with stairs or ramp for entrance    Equipment Recommendations Rolling walker (2 wheels)  Recommendations for Other Services       Functional Status Assessment Patient has had a recent  decline in their functional status and demonstrates the ability to make significant improvements in function in a reasonable and predictable amount of time.     Precautions / Restrictions Precautions Precautions: Back Precaution Booklet Issued: Yes (comment) Precaution Comments: no brace Restrictions Weight Bearing Restrictions: No      Mobility  Bed Mobility               General bed mobility comments: Pt was received sitting up in the recliner.    Transfers Overall transfer level: Needs assistance Equipment used: Rolling walker (2 wheels) Transfers: Sit to/from Stand Sit to Stand: Min guard           General transfer comment: VC's for hand placement on seated surface for safety. No assist required. Appears unsteady at times.    Ambulation/Gait Ambulation/Gait assistance: Min guard Gait Distance (Feet): 100 Feet Assistive device: Rolling walker (2 wheels) Gait Pattern/deviations: Step-through pattern, Decreased stride length, Decreased weight shift to left, Decreased dorsiflexion - left, Drifts right/left, Trunk flexed Gait velocity: Decreased Gait velocity interpretation: <1.31 ft/sec, indicative of household ambulator   General Gait Details: VC's for improved posture, closer walker proximity and forward gaze. Pt able to make corrective changes but unable to maintain.  Stairs Stairs: Yes Stairs assistance: Min assist, Min guard Stair Management: One rail Left, Step to pattern, Forwards, With cane Number of Stairs: 10 General stair comments: Light assist for balance support and safety. Pt utilizing SPC on the R with railing use on the L.  Wheelchair Mobility    Modified Rankin (Stroke Patients Only)       Balance Overall balance assessment: Mild deficits observed, not formally tested  Pertinent Vitals/Pain Pain Assessment Pain Assessment: Faces Faces Pain Scale: Hurts a little bit Pain  Location: incision in back Pain Descriptors / Indicators: Dull, Discomfort Pain Intervention(s): Limited activity within patient's tolerance, Monitored during session, Repositioned    Home Living Family/patient expects to be discharged to:: Private residence Living Arrangements: Spouse/significant other Available Help at Discharge: Family Type of Home: House Home Access: Stairs to enter Entrance Stairs-Rails: Doctor, general practice of Steps: 5 Alternate Level Stairs-Number of Steps: 12 (to get to his "man cave") Home Layout: Multi-level Home Equipment: Shower seat - built in;Cane - single Librarian, academic (2 wheels);Grab bars - tub/shower;Grab bars - toilet;Hand held shower head;Adaptive equipment      Prior Function Prior Level of Function : Independent/Modified Independent;Driving             Mobility Comments: uses "space saver" RW that folds up to a smaller footprint for tight spaces ADLs Comments: mod I independent ADLs with AE (is not able to use sock donner, or long handle shoe horn), light IADLs     Hand Dominance   Dominant Hand: Right    Extremity/Trunk Assessment   Upper Extremity Assessment Upper Extremity Assessment: Generalized weakness    Lower Extremity Assessment Lower Extremity Assessment: Generalized weakness;LLE deficits/detail LLE Deficits / Details: Baseline foot drop that pt reports has been present for years. States he has an AFO but stopped wearing it because it was difficult to don and started rubbing a sore on the back of his leg.    Cervical / Trunk Assessment Cervical / Trunk Assessment: Back Surgery  Communication   Communication: No difficulties  Cognition Arousal/Alertness: Awake/alert Behavior During Therapy: WFL for tasks assessed/performed Overall Cognitive Status: Impaired/Different from baseline Area of Impairment: Safety/judgement, Awareness, Problem solving                         Safety/Judgement:  Decreased awareness of safety, Decreased awareness of deficits Awareness: Emergent Problem Solving: Requires verbal cues          General Comments      Exercises     Assessment/Plan    PT Assessment Patient needs continued PT services  PT Problem List Decreased strength;Decreased activity tolerance;Decreased balance;Decreased mobility;Decreased knowledge of use of DME;Decreased knowledge of precautions;Decreased safety awareness;Pain       PT Treatment Interventions DME instruction;Gait training;Stair training;Functional mobility training;Therapeutic activities;Therapeutic exercise;Balance training;Patient/family education    PT Goals (Current goals can be found in the Care Plan section)  Acute Rehab PT Goals Patient Stated Goal: Home today PT Goal Formulation: With patient Time For Goal Achievement: 01/04/23 Potential to Achieve Goals: Good    Frequency Min 5X/week     Co-evaluation               AM-PAC PT "6 Clicks" Mobility  Outcome Measure Help needed turning from your back to your side while in a flat bed without using bedrails?: A Little Help needed moving from lying on your back to sitting on the side of a flat bed without using bedrails?: A Little Help needed moving to and from a bed to a chair (including a wheelchair)?: A Little Help needed standing up from a chair using your arms (e.g., wheelchair or bedside chair)?: A Little Help needed to walk in hospital room?: A Little Help needed climbing 3-5 steps with a railing? : A Little 6 Click Score: 18    End of Session Equipment Utilized During Treatment: Gait belt Activity Tolerance: Patient tolerated  treatment well Patient left: in chair;with call bell/phone within reach Nurse Communication: Mobility status PT Visit Diagnosis: Unsteadiness on feet (R26.81);Pain Pain - part of body:  (back)    Time: 4010-2725 PT Time Calculation (min) (ACUTE ONLY): 18 min   Charges:   PT Evaluation $PT Eval Low  Complexity: 1 Low          Conni Slipper, PT, DPT Acute Rehabilitation Services Secure Chat Preferred Office: [phone]   Marylynn Pearson 12/28/2022, 11:23 AM

## 2022-12-28 NOTE — Evaluation (Signed)
Occupational Therapy Evaluation Patient Details Name: [name] Pratt MRN: [id number] DOB: Feb 09, 1941 Today's Date: 12/28/2022   History of Present Illness Pt is an 82 y/o admitted for planned Bilateral decompressive laminectomies L2-3 L3-4. 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   Clinical Impression   Pt is typically mod I for mobility and ADL with DME/AE. Pt educated on back precautions, provided handout and reviewed in full. Pt verbalized understanding but unable to problem solve how he will perform LB ADL in home setting as his wife has her own health problems and some of AE does not work for him (long handle shoe horn and sock aid). Today Pt was able to demonstrate transfers at min guard assist level, biggest concerns remains as LB ADL. Please see ADL section for full details. Recommend HHOT post-acute to maximize safety and independence in ADL and functional transfers.       Recommendations for follow up therapy are one component of a multi-disciplinary discharge planning process, led by the attending physician.  Recommendations may be updated based on patient status, additional functional criteria and insurance authorization.   Assistance Recommended at Discharge Intermittent Supervision/Assistance  Patient can return home with the following A little help with walking and/or transfers;A lot of help with bathing/dressing/bathroom;Assistance with cooking/housework;Assist for transportation;Help with stairs or ramp for entrance    Functional Status Assessment  Patient has  had a recent decline in their functional status and demonstrates the ability to make significant improvements in function in a reasonable and predictable amount of time.  Equipment Recommendations  None recommended by OT (Pt has appropriate DME and AE)    Recommendations for Other Services PT consult     Precautions / Restrictions Precautions Precautions: Back Precaution Booklet Issued: Yes (comment) Precaution Comments: no brace Restrictions Weight Bearing Restrictions: No      Mobility Bed Mobility               General bed mobility comments: sitting EOB at beginning of session and in recliner at the end    Transfers Overall transfer level: Needs assistance Equipment used: Rolling walker (2 wheels) Transfers: Sit to/from Stand Sit to Stand: Min guard, Min assist           General transfer comment: assist for balance and initial boost, improved with transfers throughout      Balance Overall balance assessment: Mild deficits observed, not formally tested                                         ADL either performed or assessed with clinical judgement   ADL Overall ADL's : Needs assistance/impaired Eating/Feeding: Independent   Grooming: Min guard;Standing;Cueing for compensatory techniques Grooming Details (indicate cue type and reason): sink level, vc for back precautions getting to the sink and navigating with RW Upper Body Bathing: Min guard;Cueing for compensatory techniques;With adaptive equipment Upper Body Bathing Details (indicate cue type and reason): recommended long handle sponge Lower Body Bathing: Moderate assistance;Sitting/lateral leans Lower Body Bathing Details (indicate cue type and reason): recommended use of long handle sponge for the knees down and to prevent twisting Upper Body Dressing : Modified independent  Lower Body Dressing: Maximal assistance;Cueing for sequencing;Cueing for compensatory techniques;Cueing for  back precautions;Sit to/from stand Lower Body Dressing Details (indicate cue type and reason): Pt is unable to perform figure 4, he has grabber/reacher for underwear/pants - educated to dress R leg (harder to move leg) first. Pt states that he has tried to use sock aid and that he cannot make it work. OT recommend that if wife is going to help him with socks they need to do it from a SAFE position and NOT him standing trying to lift his leg to her chair. Pt also reports that he is often up much earlier than his wife and he likes to go to the [employer]. At this time he is currently unable to access his LB without significant assist while maintaining back precautions. Toilet Transfer: Min guard;Rolling walker (2 wheels);Ambulation Toilet Transfer Details (indicate cue type and reason): use of grab bars Toileting- Clothing Manipulation and Hygiene: Moderate assistance;Sit to/from stand Toileting - Clothing Manipulation Details (indicate cue type and reason): Pt leans to the left side and reaches behind for peri care. multiple cues for twisting. Also if clothing goes below knees he needs assist for pulling up Tub/ Shower Transfer: Walk-in shower;Minimal assistance;Shower seat;Rolling walker (2 wheels);Grab bars   Functional mobility during ADLs: Min guard;Rolling walker (2 wheels) (utilized Pt's personal RW which is a "space saver" version) General ADL Comments: genuine safety concerns with this patient being able to maintain back precautions in home setting     Vision Baseline Vision/History: 6 Macular Degeneration Ability to See in Adequate Light: 1 Impaired (but functional for room navigation/stair navigation) Patient Visual Report: No change from baseline       Perception     Praxis      Pertinent Vitals/Pain Pain Assessment Pain Assessment: Faces Faces Pain Scale: Hurts a little bit Pain Location: incision in back Pain Descriptors / Indicators: Dull, Discomfort Pain Intervention(s):  Monitored during session, Repositioned, Ice applied     Hand Dominance Right   Extremity/Trunk Assessment Upper Extremity Assessment Upper Extremity Assessment: Generalized weakness (baseline deficits in hands with decreased grasp (4/5) functional)       Cervical / Trunk Assessment Cervical / Trunk Assessment: Back Surgery   Communication Communication Communication: No difficulties   Cognition Arousal/Alertness: Awake/alert Behavior During Therapy: WFL for tasks assessed/performed Overall Cognitive Status: Impaired/Different from baseline Area of Impairment: Safety/judgement, Awareness, Problem solving                         Safety/Judgement: Decreased awareness of safety, Decreased awareness of deficits Awareness: Emergent Problem Solving: Requires verbal cues General Comments: Pt unable at this time to problem solve how he will perform LB ADL without bending. Despite education Pt continued to struggle with verbally confirming solutions     General Comments       Exercises     Shoulder Instructions      Home Living Family/patient expects to be discharged to:: Private residence Living Arrangements: Spouse/significant other Available Help at Discharge: Family Type of Home: House Home Access: Stairs to enter Secretary/administrator of Steps: 5 Entrance Stairs-Rails: Right;Left Home Layout: Multi-level Alternate Level Stairs-Number of Steps: 12 (to get to his "man cave") Alternate Level Stairs-Rails: Left Bathroom Shower/Tub: Producer, television/film/video: Handicapped height Bathroom Accessibility: Yes How Accessible: Accessible via walker Home Equipment: Shower seat - built in;Cane - single point;Rolling Walker (2 wheels);Grab bars - tub/shower;Grab bars - toilet;Hand held shower head;Adaptive equipment  Adaptive Equipment: Reacher;Sock aid;Long-handled shoe horn;Long-handled sponge (does not use sock aid or shoe horn)        Prior  Functioning/Environment Prior Level of Function : Independent/Modified Independent;Driving             Mobility Comments: uses "space saver" RW ADLs Comments: mod I independent ADLs with AE (is not able to use sock donner, or long handle shoe horn), light IADLs        OT Problem List: Decreased range of motion;Impaired balance (sitting and/or standing);Decreased safety awareness;Decreased knowledge of use of DME or AE;Decreased knowledge of precautions;Pain      OT Treatment/Interventions:      OT Goals(Current goals can be found in the care plan section) Acute Rehab OT Goals Patient Stated Goal: be independent OT Goal Formulation: With patient Time For Goal Achievement: 01/11/23 Potential to Achieve Goals: Fair  OT Frequency:      Co-evaluation              AM-PAC OT "6 Clicks" Daily Activity     Outcome Measure Help from another person eating meals?: None Help from another person taking care of personal grooming?: None Help from another person toileting, which includes using toliet, bedpan, or urinal?: A Little Help from another person bathing (including washing, rinsing, drying)?: A Lot Help from another person to put on and taking off regular upper body clothing?: A Little Help from another person to put on and taking off regular lower body clothing?: A Lot 6 Click Score: 18   End of Session Equipment Utilized During Treatment: Gait belt;Rolling walker (2 wheels) (Pt's personal RW) Nurse Communication: Mobility status;Precautions  Activity Tolerance: Patient tolerated treatment well Patient left: in chair;with call bell/phone within reach  OT Visit Diagnosis: Other abnormalities of gait and mobility (R26.89);Repeated falls (R29.6);History of falling (Z91.81)                Time: 7829-5621 OT Time Calculation (min): 38 min Charges:  OT General Charges $OT Visit: 1 Visit OT Evaluation $OT Eval Moderate Complexity: 1 Mod  Nyoka Cowden OTR/L Acute Rehabilitation  Services Office: [phone]  Evern Bio [facility] 12/28/2022, 10:17 AM

## 2022-12-28 NOTE — Progress Notes (Signed)
Patient alert and oriented, mae's well, voiding adequate amount of urine, swallowing without difficulty, no c/o pain at time of discharge. Patient discharged home with family. Script and discharged instructions given to patient. Patient and family stated understanding of instructions given. Patient has an appointment with Dr. Cram in 2 weeks 

## 2022-12-28 NOTE — Discharge Summary (Addendum)
Physician Discharge Summary  Patient ID: [name] Pratt MRN: [id number] DOB/AGE: 11/15/1940 82 y.o. Estimated body mass index is 25.66 kg/m as calculated from the following:   Height as of this encounter:  (1.676 m).   Weight as of this encounter: 72.1 kg.   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  Patient be discharged with scheduled follow-up in 1 to 2 weeks.  Consults: Significant Diagnostic Studies: Treatments: Decompressive laminectomy L2-3 L3-4 Discharge Exam: Blood pressure (!) 158/81, pulse (!) 57, temperature 97.7 F (36.5 C), temperature source Oral, resp. rate 16, height  (1.676 m), weight 72.1 kg, SpO2 100 %. Strength 5 out of 5 wound clean dry and intact  Disposition: Home  Discharge Instructions     Face-to-face encounter (required for Medicare/Medicaid patients)   Complete by: As directed    I Mariam Dollar certify that this patient is under my care and that I, or a nurse practitioner or physician's assistant working with me, had a face-to-face encounter that meets the physician face-to-face encounter requirements with this patient on 12/28/2022. The encounter with the patient was in whole, or in part for the following medical condition(s) which is the primary reason for home health care (List medical condition): Lumbar spinal stenosis   The encounter with the patient was in whole, or in part, for the following medical condition, which is the primary reason for home health care: Lumbar spinal stenosis   I certify that, based  on my findings, the following services are medically necessary home health services: Physical therapy   Reason for Medically Necessary Home Health Services: Therapy- Instruction on Safe use of Assistive Devices for ADLs   My clinical findings support the need for the above services: Pain interferes with ambulation/mobility   Further, I certify that my clinical findings support that this patient is homebound due to: Pain interferes with ambulation/mobility   Home Health   Complete by: As directed    To provide the following care/treatments: PT      Allergies as of 12/28/2022       Reactions   Pravastatin Other (See Comments)   Muscle cramps   Sildenafil Other (See Comments)   Tachycardia   Oxycodone Other (See Comments)   Hallucinations   Atenolol Other (See Comments)   Bradycardia   Elastic Bandages & [zinc] Other (See Comments)   Teflon bandages - Irritation   Metoclopramide Hcl Anxiety        Medication List     TAKE these medications    acetaminophen 500 MG tablet Commonly known as: TYLENOL Take 500 mg by mouth 2 (two) times daily.   albuterol 108 (90 Base) MCG/ACT inhaler Commonly known as: VENTOLIN HFA INHALE 2 PUFFS INTO THE LUNGS EVERY 6 HOURS AS NEEDED FOR SHORTNESS OF BREATH. What changed: See the new instructions.   allopurinol 100 MG tablet Commonly known as: ZYLOPRIM Take 1 tablet (100 mg total) by mouth 2 (two) times daily.   amLODipine 5 MG tablet Commonly known as: NORVASC Take 1 tablet (5 mg total) by mouth daily.   antiseptic oral rinse Liqd 15 mLs by Mouth Rinse route  2 (two) times daily.   Artificial Tears 1.4 % ophthalmic solution Generic drug: polyvinyl alcohol Place 1 drop into both eyes daily as needed for dry eyes.   aspirin EC 81 MG tablet Take 81 mg by mouth at bedtime.   bacitracin ointment Apply 1 Application topically 2 (two) times daily. What changed:  when to take this reasons to take this   cholestyramine 4 GM/DOSE  powder Commonly known as: QUESTRAN Take by mouth daily. 30 ml   Claritin 10 MG tablet Generic drug: loratadine Take 10 mg by mouth daily as needed for allergies or rhinitis.   cycloSPORINE 0.05 % ophthalmic emulsion Commonly known as: RESTASIS Place 1 drop into both eyes 2 (two) times daily.   docusate sodium 100 MG capsule Commonly known as: Colace Take 1 capsule (100 mg total) by mouth 2 (two) times daily. What changed:  when to take this reasons to take this   fluticasone 50 MCG/ACT nasal spray Commonly known as: FLONASE Place 1 spray into both nostrils daily as needed for allergies.   gabapentin 300 MG capsule Commonly known as: NEURONTIN Take 300 mg by mouth at bedtime.   Gemtesa 75 MG Tabs Generic drug: Vibegron Take 75 mg by mouth daily.   glucose blood test strip TRUE Metrix Blood Glucose Test Strip, use as instructed   hydrALAZINE 50 MG tablet Commonly known as: APRESOLINE Take 1 tablet (50 mg total) by mouth 2 (two) times daily.   ICY HOT EX Apply 1 Application topically at bedtime. Lidocaine / on back   Iron (Ferrous Sulfate) 325 (65 Fe) MG Tabs Take 325 mg by mouth every other day. What changed: when to take this   Lancets Misc 1 each by Does not apply route 2 (two) times daily. TRUE matrix lancets   levothyroxine 75 MCG tablet Commonly known as: SYNTHROID Take 75 mcg once a week (patient on 150 mcg 6 days a week) Dispense #24 for 90 day supply. What changed: additional instructions   levothyroxine 150 MCG tablet Commonly known as: SYNTHROID TAKE 1 TABLET ON 6 DAYS PER WEEK (TAKE 75 MCG ONE TIME PER WEEK) What changed: Another medication with the same name was changed. Make sure you understand how and when to take each.   losartan 100 MG tablet Commonly known as: COZAAR TAKE 1 TABLET EVERY DAY What changed: when to take this   Magnesium 500 MG Tabs Take 500 mg by mouth daily.   metroNIDAZOLE 0.75 % cream Commonly known as:  METROCREAM Apply 1 Application topically daily as needed (head).   multivitamin with minerals tablet Take 1 tablet by mouth daily.   multivitamin-lutein Caps capsule Take 1 capsule by mouth daily.   nitroGLYCERIN 0.4 MG SL tablet Commonly known as: Nitrostat Place 1 tablet (0.4 mg total) under the tongue every 5 (five) minutes as needed for chest pain.   OVER THE COUNTER MEDICATION Place 1 spray into both nostrils 2 (two) times daily. Sinus saline   OVER THE COUNTER MEDICATION Take 15 mLs by mouth at bedtime. Yellow Mustard   polyethylene glycol 17 g packet Commonly known as: MIRALAX / GLYCOLAX Take 17-34 g by mouth daily as needed for mild constipation.   PREBIOTIC PRODUCT PO Take 1 tablet by mouth daily.   PRESCRIPTION MEDICATION CPAP- At bedtime and during any time of rest   PROBIOTIC DAILY PO Take 1 capsule by mouth daily as needed (Constipation).   rosuvastatin 5 MG tablet Commonly known as: CRESTOR Take 1 tablet (5 mg total)  by mouth daily. What changed: when to take this   vitamin B-12 500 MCG tablet Commonly known as: CYANOCOBALAMIN Take 500 mcg by mouth in the morning.         Signed: Mariam Dollar 12/28/2022, 7:59 AM

## 2022-12-28 NOTE — Discharge Instructions (Signed)
Wound Care  Keep the incision clean and dry remove the outer dressing in 3 days Do not put any creams, lotions, or ointments on incision. Leave steri-strips on back.  They will fall off by themselves.  Activity Walk each and every day, increasing distance each day. No lifting greater than 5 lbs.  No lifting no bending no twisting no driving or riding a car unless coming back and forth to see me. If provided with back brace, wear when out of bed.  It is not necessary to wear brace in bed. Diet Resume your normal diet.    Call Your Doctor If Any of These Occur Redness, drainage, or swelling at the wound.  Temperature greater than 101 degrees. Severe pain not relieved by pain medication. Incision starts to come apart. Follow Up Appt Call [phone]) or problems.  If you have any hardware placed in your spine, you will need an x-ray before your appointment.

## 2022-12-29 ENCOUNTER — Telehealth: Payer: Self-pay | Admitting: Family

## 2022-12-29 NOTE — Telephone Encounter (Signed)
Patient notified of providers message. 

## 2022-12-29 NOTE — Telephone Encounter (Signed)
Unfortunately I cannot prescribe narcotics without a face to face visit. I would recommend that he reach back out to his surgeon.

## 2022-12-29 NOTE — Telephone Encounter (Signed)
Pt wanted to tell CMA how his surgery went and talk to her about something else. Did not elaborate more than that. Please call to discuss

## 2022-12-30 ENCOUNTER — Emergency Department (HOSPITAL_BASED_OUTPATIENT_CLINIC_OR_DEPARTMENT_OTHER)
Admission: EM | Admit: 2022-12-30 | Discharge: 2022-12-30 | Disposition: A | Discharge status: Home / Self Care | Payer: Medicare HMO | Attending: Emergency Medicine | Admitting: Emergency Medicine

## 2022-12-30 ENCOUNTER — Encounter (HOSPITAL_BASED_OUTPATIENT_CLINIC_OR_DEPARTMENT_OTHER): Payer: Self-pay

## 2022-12-30 ENCOUNTER — Emergency Department (HOSPITAL_BASED_OUTPATIENT_CLINIC_OR_DEPARTMENT_OTHER): Payer: Medicare HMO

## 2022-12-30 DIAGNOSIS — R1031 Right lower quadrant pain: Secondary | ICD-10-CM | POA: Insufficient documentation

## 2022-12-30 DIAGNOSIS — R1084 Generalized abdominal pain: Secondary | ICD-10-CM | POA: Diagnosis not present

## 2022-12-30 DIAGNOSIS — M545 Low back pain, unspecified: Secondary | ICD-10-CM | POA: Insufficient documentation

## 2022-12-30 DIAGNOSIS — K5900 Constipation, unspecified: Secondary | ICD-10-CM | POA: Diagnosis not present

## 2022-12-30 DIAGNOSIS — I10 Essential (primary) hypertension: Secondary | ICD-10-CM | POA: Diagnosis not present

## 2022-12-30 DIAGNOSIS — R1032 Left lower quadrant pain: Secondary | ICD-10-CM | POA: Diagnosis not present

## 2022-12-30 DIAGNOSIS — Z7982 Long term (current) use of aspirin: Secondary | ICD-10-CM | POA: Diagnosis not present

## 2022-12-30 DIAGNOSIS — R1030 Lower abdominal pain, unspecified: Secondary | ICD-10-CM

## 2022-12-30 LAB — CBC WITH DIFFERENTIAL/PLATELET
Abs Immature Granulocytes: 0.04 10*3/uL (ref 0.00–0.07)
Basophils Absolute: 0 10*3/uL (ref 0.0–0.1)
Basophils Relative: 0 %
Eosinophils Absolute: 0.1 10*3/uL (ref 0.0–0.5)
Eosinophils Relative: 1 %
HCT: 31.7 % — ABNORMAL LOW (ref 39.0–52.0)
Hemoglobin: 10.4 g/dL — ABNORMAL LOW (ref 13.0–17.0)
Immature Granulocytes: 0 %
Lymphocytes Relative: 11 %
Lymphs Abs: 1 10*3/uL (ref 0.7–4.0)
MCH: 31.3 pg (ref 26.0–34.0)
MCHC: 32.8 g/dL (ref 30.0–36.0)
MCV: 95.5 fL (ref 80.0–100.0)
Monocytes Absolute: 1 10*3/uL (ref 0.1–1.0)
Monocytes Relative: 11 %
Neutro Abs: 7.1 10*3/uL (ref 1.7–7.7)
Neutrophils Relative %: 77 %
Platelets: 297 10*3/uL (ref 150–400)
RBC: 3.32 MIL/uL — ABNORMAL LOW (ref 4.22–5.81)
RDW: 13.9 % (ref 11.5–15.5)
WBC: 9.3 10*3/uL (ref 4.0–10.5)
nRBC: 0 % (ref 0.0–0.2)

## 2022-12-30 LAB — URINALYSIS, ROUTINE W REFLEX MICROSCOPIC
Bilirubin Urine: NEGATIVE
Glucose, UA: NEGATIVE mg/dL
Hgb urine dipstick: NEGATIVE
Ketones, ur: 15 mg/dL — AB
Leukocytes,Ua: NEGATIVE
Nitrite: NEGATIVE
Protein, ur: 100 mg/dL — AB
Specific Gravity, Urine: 1.025 (ref 1.005–1.030)
pH: 6 (ref 5.0–8.0)

## 2022-12-30 LAB — URINALYSIS, MICROSCOPIC (REFLEX)

## 2022-12-30 LAB — COMPREHENSIVE METABOLIC PANEL
ALT: 8 U/L (ref 0–44)
AST: 23 U/L (ref 15–41)
Albumin: 3.5 g/dL (ref 3.5–5.0)
Alkaline Phosphatase: 69 U/L (ref 38–126)
Anion gap: 8 (ref 5–15)
BUN: 25 mg/dL — ABNORMAL HIGH (ref 8–23)
CO2: 26 mmol/L (ref 22–32)
Calcium: 8.6 mg/dL — ABNORMAL LOW (ref 8.9–10.3)
Chloride: 103 mmol/L (ref 98–111)
Creatinine, Ser: 1.19 mg/dL (ref 0.61–1.24)
GFR, Estimated: >60 mL/min (ref >60)
Glucose, Bld: 100 mg/dL — ABNORMAL HIGH (ref 70–99)
Potassium: 3.4 mmol/L — ABNORMAL LOW (ref 3.5–5.1)
Sodium: 137 mmol/L (ref 135–145)
Total Bilirubin: 0.9 mg/dL (ref 0.3–1.2)
Total Protein: 7.2 g/dL (ref 6.5–8.1)

## 2022-12-30 LAB — LIPASE, BLOOD: Lipase: 24 U/L (ref 11–51)

## 2022-12-30 MED ORDER — DOCUSATE SODIUM 100 MG PO CAPS: 100.0000 mg | ORAL_CAPSULE | Freq: Two times a day (BID) | ORAL | 0 refills | Status: DC

## 2022-12-30 MED ORDER — ACETAMINOPHEN 500 MG PO TABS
1000.0000 mg | ORAL_TABLET | Freq: Once | ORAL | Status: AC
  Administered 2022-12-30: 1000 mg via ORAL
  Filled 2022-12-30: qty 2

## 2022-12-30 MED ORDER — POLYETHYLENE GLYCOL 3350 17 GM/SCOOP PO POWD: 17.0000 g | Freq: Two times a day (BID) | ORAL | 0 refills | Status: DC | PRN

## 2022-12-30 NOTE — ED Triage Notes (Signed)
Patient having lumber pain. He had back surgery 3 days ago.

## 2022-12-30 NOTE — ED Provider Notes (Signed)
EMERGENCY DEPARTMENT AT [facility] Provider Note   CSN: [id number] Arrival date & time: 12/30/22  1036     History  Chief Complaint  Patient presents with   Abdominal Pain    [name] Pratt is a 82 y.o. male.  Patient with history of neurogenic claudication, spinal stenosis, chronic lower back and hip pain, presents after having surgery on 12/27/2022 by Dr. Wynetta Emery (Bilateral decompressive laminectomies L2-3 L3-4 with foraminotomies of the L2, L3, and L4 nerve roots bilaterally medial facetectomies) --presents by EMS today for evaluation of abdominal pain.  Patient states that after waking this morning he developed a cramping pain in his bilateral lower abdomen.  This was a persistent pain, prompting emergency department visit.  He has had pain in his lower back related to his recent surgery.  He has been taking Tylenol, 1000 mg for this.  Last dose was 9 AM.  Lower abdominal pain is improved currently but not resolved.  He has been urinating.  He denies fevers, chest pain, shortness of breath.  Denies diarrhea.  He has been constipated and has not had a bowel movement since prior to his surgery.       Home Medications Prior to Admission medications   Medication Sig Start Date End Date Taking? Authorizing Provider  acetaminophen (TYLENOL) 500 MG tablet Take 500 mg by mouth 2 (two) times daily.    [provider]  albuterol (VENTOLIN HFA) 108 (90 Base) MCG/ACT inhaler INHALE 2 PUFFS INTO THE LUNGS EVERY 6 HOURS AS NEEDED FOR SHORTNESS OF BREATH. Patient taking differently: Inhale 2 puffs into the lungs every 6 (six) hours as needed for shortness of breath. 10/20/21   [name], NP  allopurinol (ZYLOPRIM) 100 MG tablet Take 1 tablet (100 mg total) by mouth 2 (two) times daily. 08/07/22   [name], NP  amLODipine (NORVASC) 5 MG tablet Take 1 tablet (5 mg total) by mouth daily. 12/06/22   [name], NP  antiseptic oral rinse  (BIOTENE) LIQD 15 mLs by Mouth Rinse route 2 (two) times daily.    [provider]  aspirin EC 81 MG tablet Take 81 mg by mouth at bedtime.    [provider]  bacitracin ointment Apply 1 Application topically 2 (two) times daily. Patient taking differently: Apply 1 Application topically daily as needed for wound care. 09/15/22   [name], [name], DO  cholestyramine Lanetta Inch) 4 GM/DOSE powder Take by mouth daily. 30 ml    [provider]  cycloSPORINE (RESTASIS) 0.05 % ophthalmic emulsion Place 1 drop into both eyes 2 (two) times daily.    [provider]  docusate sodium (COLACE) 100 MG capsule Take 1 capsule (100 mg total) by mouth 2 (two) times daily. Patient taking differently: Take 100 mg by mouth daily as needed for mild constipation or moderate constipation. 04/27/22 04/27/23  Dorcas Carrow, MD  fluticasone (FLONASE) 50 MCG/ACT nasal spray Place 1 spray into both nostrils daily as needed for allergies.    [provider]  gabapentin (NEURONTIN) 300 MG capsule Take 300 mg by mouth at bedtime. 12/02/19   [provider]  GEMTESA 75 MG TABS Take 75 mg by mouth daily. 10/20/21   [provider]  glucose blood test strip TRUE Metrix Blood Glucose Test Strip, use as instructed 11/12/20   [name], NP  hydrALAZINE (APRESOLINE) 50 MG tablet Take 1 tablet (50 mg total) by mouth 2 (two) times daily. 08/07/22   [name], NP  Iron,  Ferrous Sulfate, 325 (65 Fe) MG TABS Take 325 mg by mouth every other day. Patient taking differently: Take 325 mg by mouth daily. 08/10/22   [name], NP  Lancets MISC 1 each by Does not apply route 2 (two) times daily. TRUE matrix lancets 11/12/20   [name], NP  levothyroxine (SYNTHROID) 150 MCG tablet TAKE 1 TABLET ON 6 DAYS PER WEEK (TAKE 75 MCG ONE TIME PER WEEK) 12/03/22   [name], NP  levothyroxine (SYNTHROID) 75 MCG tablet Take 75 mcg once a week (patient on 150  mcg 6 days a week) Dispense #24 for 90 day supply. Patient taking differently: Take 75 mcg once a week (patient on 150 mcg 6 days a week) Dispense #24 for 90 day supply. Sunday 08/07/22   [name], NP  loratadine (CLARITIN) 10 MG tablet Take 10 mg by mouth daily as needed for allergies or rhinitis.    [provider]  losartan (COZAAR) 100 MG tablet TAKE 1 TABLET EVERY DAY Patient taking differently: Take 100 mg by mouth at bedtime. 08/10/22   [name], NP  Magnesium 500 MG TABS Take 500 mg by mouth daily.    [provider]  Menthol, Topical Analgesic, (ICY HOT EX) Apply 1 Application topically at bedtime. Lidocaine / on back    [provider]  metroNIDAZOLE (METROCREAM) 0.75 % cream Apply 1 Application topically daily as needed (head).    [provider]  Multiple Vitamins-Minerals (MULTIVITAMIN WITH MINERALS) tablet Take 1 tablet by mouth daily.    [provider]  multivitamin-lutein (OCUVITE-LUTEIN) CAPS capsule Take 1 capsule by mouth daily.    [provider]  nitroGLYCERIN (NITROSTAT) 0.4 MG SL tablet Place 1 tablet (0.4 mg total) under the tongue every 5 (five) minutes as needed for chest pain. 09/16/21   Wendall Stade, MD  OVER THE COUNTER MEDICATION Place 1 spray into both nostrils 2 (two) times daily. Sinus saline    [provider]  OVER THE COUNTER MEDICATION Take 15 mLs by mouth at bedtime. Yellow Mustard    [provider]  polyethylene glycol (MIRALAX / GLYCOLAX) 17 g packet Take 17-34 g by mouth daily as needed for mild constipation.    [provider]  polyvinyl alcohol (ARTIFICIAL TEARS) 1.4 % ophthalmic solution Place 1 drop into both eyes daily as needed for dry eyes.    [provider]  PREBIOTIC PRODUCT PO Take 1 tablet by mouth daily.    [provider]  PRESCRIPTION MEDICATION CPAP- At bedtime and during any time of rest    [provider]   Probiotic Product (PROBIOTIC DAILY PO) Take 1 capsule by mouth daily as needed (Constipation).    [provider]  rosuvastatin (CRESTOR) 5 MG tablet Take 1 tablet (5 mg total) by mouth daily. Patient taking differently: Take 5 mg by mouth at bedtime. 08/07/22   [name], NP  vitamin B-12 (CYANOCOBALAMIN) 500 MCG tablet Take 500 mcg by mouth in the morning.    [provider]      Allergies    Pravastatin, Sildenafil, Oxycodone, Atenolol, Elastic bandages & [zinc], and Metoclopramide hcl    Review of Systems   Review of Systems  Physical Exam Updated Vital Signs BP (!) 175/75   Pulse 61   Temp 98 F (36.7 C) (Oral)   Resp 18   Ht 5\' 6"  (1.676 m)   Wt 72 kg   SpO2 96%   BMI 25.62 kg/m  Physical Exam  Vitals and nursing note reviewed.  Constitutional:      General: He is not in acute distress.    Appearance: He is well-developed.  HENT:     Head: Normocephalic and atraumatic.  Eyes:     General:        Right eye: No discharge.        Left eye: No discharge.     Conjunctiva/sclera: Conjunctivae normal.  Cardiovascular:     Rate and Rhythm: Normal rate and regular rhythm.     Heart sounds: Normal heart sounds.  Pulmonary:     Effort: Pulmonary effort is normal.     Breath sounds: Normal breath sounds.  Abdominal:     Palpations: Abdomen is soft.     Tenderness: There is abdominal tenderness (Mild) in the right lower quadrant, suprapubic area and left lower quadrant. There is no guarding or rebound.  Musculoskeletal:     Cervical back: Normal range of motion and neck supple.     Comments: Patient with midline surgical scars over the lower back noted to be clean and intact, patient has bandaging over these areas.  Mild tenderness to palpation.  Skin:    General: Skin is warm and dry.  Neurological:     Mental Status: He is alert.     Comments: Patient with good flexion extension of the ankles bilaterally.     ED Results / Procedures /  Treatments   Labs (all labs ordered are listed, but only abnormal results are displayed) Labs Reviewed  CBC WITH DIFFERENTIAL/PLATELET - Abnormal; Notable for the following components:      Result Value   RBC 3.32 (*)    Hemoglobin 10.4 (*)    HCT 31.7 (*)    All other components within normal limits  COMPREHENSIVE METABOLIC PANEL - Abnormal; Notable for the following components:   Potassium 3.4 (*)    Glucose, Bld 100 (*)    BUN 25 (*)    Calcium 8.6 (*)    All other components within normal limits  URINALYSIS, ROUTINE W REFLEX MICROSCOPIC - Abnormal; Notable for the following components:   Ketones, ur 15 (*)    Protein, ur 100 (*)    All other components within normal limits  URINALYSIS, MICROSCOPIC (REFLEX) - Abnormal; Notable for the following components:   Bacteria, UA FEW (*)    All other components within normal limits  LIPASE, BLOOD    EKG None  Radiology DG Abdomen 1 View  Result Date: 12/30/2022 CLINICAL DATA:  Constipation. EXAM: ABDOMEN - 1 VIEW COMPARISON:  CT of the abdomen pelvis 12/03/22 FINDINGS: The bowel gas pattern is normal. No radio-opaque calculi or other significant radiographic abnormality are seen. 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 Bilateral total hip arthroplasty noted. IMPRESSION: 1. Nonobstructive bowel gas pattern. 2. 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 Electronically Signed   By: Marin Roberts M.D.   On: 12/30/2022 13:47    Procedures Procedures    Medications Ordered in ED Medications - No data to display  ED Course/ Medical Decision Making/ A&P    Patient seen and examined. History obtained directly from patient.   Labs/EKG: Ordered CBC, CMP, lipase, UA  Imaging: None ordered  Medications/Fluids: None ordered, patient request no opioids  Most recent vital signs reviewed and are as follows: BP (!) 175/75   Pulse 61   Temp 98 F (36.7 C) (Oral)   Resp 18   Ht 5'  6" (1.676 m)  Wt 72 kg   SpO2 96%   BMI 25.62 kg/m   Initial impression: Lower abdominal pain, bilateral, improving, recent back pain after surgery without any apparent complications at time of initial exam.  2:22 PM Reassessment performed. Patient appears stable. Abdominal exam stable with mild lower abd tenderness, no rebound or guarding.   Patient discussed with and seen by Dr. Rubin Payor in interim.   Labs personally reviewed and interpreted including: CBC with normal white blood cell count at 9.3, mild anemia at 10.4 but stable; CMP minimally low potassium at 3.4 otherwise unremarkable, normal kidney function today; lipase normal; UA without compelling signs of infection.  Imaging personally visualized and interpreted including: X-ray of the abdomen, agree nonspecific bowel gas pattern without evidence of obstruction or fecal impaction.  Reviewed pertinent lab work and imaging with patient at bedside. Questions answered.   Most current vital signs reviewed and are as follows: BP (!) 173/77   Pulse (!) 53   Temp 98.1 F (36.7 C)   Resp 15   Ht  (1.676 m)   Wt 72 kg   SpO2 100%   BMI 25.62 kg/m   Plan: Discharge home today, patient encouraged to start taking MiraLAX twice a day, maintain good hydration, continue Colace for stool softener and Tylenol for back pain and abdominal pain.  Prescriptions written for: MiraLAX, Colace  Other home care instructions discussed: Hydration  ED return instructions discussed: The patient was urged to return to the Emergency Department immediately with worsening of current symptoms, worsening abdominal pain, persistent vomiting, blood noted in stools, fever, or any other concerns. The patient verbalized understanding.   Follow-up instructions discussed: Patient encouraged to follow-up with their PCP in 2 days.                             Medical Decision Making Amount and/or Complexity of Data Reviewed Labs: ordered. Radiology:  ordered.  Risk OTC drugs.   For this patient's complaint of abdominal pain, the following conditions were considered on the differential diagnosis: gastritis/PUD, enteritis/duodenitis, appendicitis, cholelithiasis/cholecystitis, cholangitis, pancreatitis, ruptured viscus, colitis, diverticulitis, small/large bowel obstruction, proctitis, cystitis, pyelonephritis, ureteral colic, aortic dissection, aortic aneurysm. Atypical chest etiologies were also considered including ACS, PE, and pneumonia.  Patient looks well, reassuring abdominal exam, labs are reassuring.  No evidence of urinary retention.  He is complicated and suspect element of postoperative ileus.   The patient's vital signs, pertinent lab work and imaging were reviewed and interpreted as discussed in the ED course. Hospitalization was considered for further testing, treatments, or serial exams/observation. However as patient is well-appearing, has a stable exam, and reassuring studies today, I do not feel that they warrant admission at this time. This plan was discussed with the patient who verbalizes agreement and comfort with this plan and seems reliable and able to return to the Emergency Department with worsening or changing symptoms.          Final Clinical Impression(s) / ED Diagnoses Final diagnoses:  Lower abdominal pain    Rx / DC Orders ED Discharge Orders          Ordered    docusate sodium (COLACE) 100 MG capsule  Every 12 hours        12/30/22 1420    polyethylene glycol powder (GLYCOLAX/MIRALAX) 17 GM/SCOOP powder  2 times daily PRN        12/30/22 1420  Renne Crigler, PA-C 12/30/22 1425    Benjiman Core, MD 12/30/22 1553

## 2022-12-30 NOTE — Discharge Instructions (Addendum)
Please read and follow all provided instructions.  Your diagnoses today include:  1. Lower abdominal pain     Tests performed today include: Blood cell counts and platelets Kidney and liver function tests Pancreas function test (called lipase) Urine test to look for infection X-ray of the abdomen which did not show an impaction or signs of a blockage Vital signs. See below for your results today.   Medications prescribed:  Colace - stool softener  This medication can be found over-the-counter.   Continue colace for 2 weeks after your stools return to normal to prevent constipation.   Miralax - laxative  This medication can be found over-the-counter.   Take any prescribed medications only as directed.  Home care instructions:  Follow any educational materials contained in this packet.  Follow-up instructions: Please follow-up with your primary care provider in the next 2 days for further evaluation of your symptoms.    Return instructions:  SEEK IMMEDIATE MEDICAL ATTENTION IF: The pain does not go away or becomes severe  A temperature above 101F develops  Repeated vomiting occurs (multiple episodes)  The pain becomes localized to portions of the abdomen. The right side could possibly be appendicitis. In an adult, the left lower portion of the abdomen could be colitis or diverticulitis.  Blood is being passed in stools or vomit (bright red or black tarry stools)  You develop chest pain, difficulty breathing, dizziness or fainting, or become confused, poorly responsive, or inconsolable (young children) If you have any other emergent concerns regarding your health  Additional Information: Abdominal (belly) pain can be caused by many things. Your caregiver performed an examination and possibly ordered blood/urine tests and imaging (CT scan, x-rays, ultrasound). Many cases can be observed and treated at home after initial evaluation in the emergency department. Even though you  are being discharged home, abdominal pain can be unpredictable. Therefore, you need a repeated exam if your pain does not resolve, returns, or worsens. Most patients with abdominal pain don't have to be admitted to the hospital or have surgery, but serious problems like appendicitis and gallbladder attacks can start out as nonspecific pain. Many abdominal conditions cannot be diagnosed in one visit, so follow-up evaluations are very important.  Your vital signs today were: BP (!) 173/77   Pulse (!) 53   Temp 98.1 F (36.7 C)   Resp 15   Ht  (1.676 m)   Wt 72 kg   SpO2 100%   BMI 25.62 kg/m  If your blood pressure (bp) was elevated above 135/85 this visit, please have this repeated by your doctor within one month. --------------

## 2023-01-01 ENCOUNTER — Telehealth: Payer: Self-pay | Admitting: Family

## 2023-01-01 NOTE — Telephone Encounter (Signed)
[name] Pratt to ED Saturday with bad stomach pains. Everything came out okay and he said his lung and heart good. They advised he may have been backed up in his intestines. They prescribed him some stool softeners and he has 4 great bowel movements.

## 2023-01-04 ENCOUNTER — Telehealth: Payer: Self-pay | Admitting: Family

## 2023-01-04 NOTE — Telephone Encounter (Signed)
Copied from CRM [id number]. Topic: Medicare AWV >> Jan 04, 2023  1:25 PM Payton Doughty wrote: Reason for CRM: Called patient to schedule Medicare Annual Wellness Visit (AWV). Left message for patient to call back and schedule Medicare Annual Wellness Visit (AWV).  Last date of AWV: 01/19/22  Please schedule an appointment at any time with Donne Anon, CMA  .  If any questions, please contact me.  Thank you ,  [name]; Care Guide Ambulatory Clinical Support [place] [facility] Direct Dial: [phone]

## 2023-01-05 ENCOUNTER — Telehealth: Payer: Self-pay | Admitting: Family

## 2023-01-05 NOTE — Telephone Encounter (Signed)
Contacted [name] Pratt to schedule their annual wellness visit. Call back at later date: 03/2023. Patient state spouse is ill and has lots appt between the two of them REQ CB 03/2023  [name]; Care Guide Ambulatory Clinical Support [place] [facility] Direct Dial: [phone]

## 2023-01-09 ENCOUNTER — Other Ambulatory Visit: Payer: Self-pay | Admitting: Family

## 2023-01-09 ENCOUNTER — Ambulatory Visit: Payer: Self-pay | Admitting: Licensed Clinical Social Worker

## 2023-01-09 NOTE — Patient Outreach (Signed)
  Care Coordination   Initial Visit Note   01/09/2023 Name: [name] Pratt MRN: [id number] DOB: Oct 03, 1940  [name] Pratt is a 82 y.o. year old male who sees [name], NP for primary care. I spoke with  [name] Pratt by phone today.  What matters to the patients health and wellness today? Patient had back surgery 6 weeks ago. He is   recovering from back surgery. He is trying to manage pain issues    Goals Addressed             This Visit's Progress    patient had back surgery 6 weeks ago. he is recovering from back surgery.  Is trying to manage pain issues       Interventions: Talked with client about his current needs. He had back surgery 6 weeks ago. He is trying to manage pain issues. He is recovering from back surgery Discussed program support for client with RN, LCSW and Pharmacist Discussed client medical support with NP Alma Downs 'Lendell Caprice Client will think about program support. He is aware of LCSW name and phone number and can call LCSW as needed to further discuss program support. Client appreciative of call from LCSW        SDOH assessments and interventions completed:  Yes  SDOH Interventions Today    Flowsheet Row Most Recent Value  SDOH Interventions   Physical Activity Interventions Other (Comments)  Stress Interventions Other (Comment)  [may have some stress in managing medical needs]  Social Connections Interventions Other (Comment)  [may have slight risk of social isolation]        Care Coordination Interventions:  Yes, provided   Interventions Today    Flowsheet Row Most Recent Value  Chronic Disease   Chronic disease during today's visit Other  [spoke with client about client needs]  General Interventions   General Interventions Discussed/Reviewed General Interventions Discussed, [employer]  [discussed program support]  Exercise Interventions   Exercise Discussed/Reviewed Physical Activity  Education  Interventions   Education Provided Provided Education  Provided Verbal Education On Community Resources  Mental Health Interventions   Mental Health Discussed/Reviewed Coping Strategies  [may have some stress in managing medical needs]  Safety Interventions   Safety Discussed/Reviewed Fall Risk        Follow up plan: Follow up call scheduled for 02/13/23 at 9:30 AM    Encounter Outcome:  Pt. Visit Completed   Kelton Pillar.Makira Holleman MSW, LCSW Licensed Visual merchandiser [facility] Care Management [phone]

## 2023-01-09 NOTE — Patient Instructions (Signed)
Visit Information  Thank you for taking time to visit with me today. Please don't hesitate to contact me if I can be of assistance to you.   Following are the goals we discussed today:   Goals Addressed             This Visit's Progress    patient had back surgery 6 weeks ago. he is recovering from back surgery.  Is trying to manage pain issues       Interventions: Talked with client about his current needs. He had back surgery 6 weeks ago. He is trying to manage pain issues. He is recovering from back surgery Discussed program support for client with RN, LCSW and Pharmacist Discussed client medical support with NP Alma Downs 'Lendell Caprice Client will think about program support. He is aware of LCSW name and phone number and can call LCSW as needed to further discuss program support. Client appreciative of call from LCSW        Our next appointment is by telephone on 02/13/23 at 9:30 AM   Please call the care guide team at [phone] if you need to cancel or reschedule your appointment.   If you are experiencing a Mental Health or Behavioral Health Crisis or need someone to talk to, please go to [facility] [address] [place] [phone])   The patient verbalized understanding of instructions, educational materials, and care plan provided today and DECLINED offer to receive copy of patient instructions, educational materials, and care plan.   The patient has been provided with contact information for the care management team and has been advised to call with any health related questions or concerns.   Kelton Pillar.Babbie Dondlinger MSW, LCSW Licensed Visual merchandiser [facility] Care Management [phone]

## 2023-01-10 ENCOUNTER — Telehealth: Payer: Self-pay | Admitting: Family

## 2023-01-10 ENCOUNTER — Other Ambulatory Visit: Payer: Self-pay

## 2023-01-10 NOTE — Telephone Encounter (Signed)
Called patient but no answer, left voice mail for patient to call back.   

## 2023-01-10 NOTE — Telephone Encounter (Signed)
Called patient back, he realized while we were talking that the medications received were not prescribed by [place]. He will call his surgeon about the medications.

## 2023-01-10 NOTE — Telephone Encounter (Signed)
Patient would like a call back so he can go over medication that he received and has never taken before. He is not sure if he should take them and would like a call back asap. Please advise.

## 2023-01-22 ENCOUNTER — Ambulatory Visit (INDEPENDENT_AMBULATORY_CARE_PROVIDER_SITE_OTHER): Payer: Medicare HMO | Admitting: Family

## 2023-01-22 VITALS — BP 126/61 | HR 66 | Temp 97.6°F | Resp 16 | Wt 157.0 lb

## 2023-01-22 DIAGNOSIS — M79672 Pain in left foot: Secondary | ICD-10-CM

## 2023-01-22 DIAGNOSIS — M79671 Pain in right foot: Secondary | ICD-10-CM

## 2023-01-22 DIAGNOSIS — E039 Hypothyroidism, unspecified: Secondary | ICD-10-CM | POA: Diagnosis not present

## 2023-01-22 DIAGNOSIS — R739 Hyperglycemia, unspecified: Secondary | ICD-10-CM

## 2023-01-22 DIAGNOSIS — G8929 Other chronic pain: Secondary | ICD-10-CM

## 2023-01-22 DIAGNOSIS — E559 Vitamin D deficiency, unspecified: Secondary | ICD-10-CM

## 2023-01-22 DIAGNOSIS — G4733 Obstructive sleep apnea (adult) (pediatric): Secondary | ICD-10-CM

## 2023-01-22 DIAGNOSIS — M25561 Pain in right knee: Secondary | ICD-10-CM

## 2023-01-22 DIAGNOSIS — I10 Essential (primary) hypertension: Secondary | ICD-10-CM | POA: Diagnosis not present

## 2023-01-22 DIAGNOSIS — M1A0720 Idiopathic chronic gout, left ankle and foot, without tophus (tophi): Secondary | ICD-10-CM | POA: Diagnosis not present

## 2023-01-22 DIAGNOSIS — M25562 Pain in left knee: Secondary | ICD-10-CM

## 2023-01-22 DIAGNOSIS — K219 Gastro-esophageal reflux disease without esophagitis: Secondary | ICD-10-CM

## 2023-01-22 DIAGNOSIS — I6523 Occlusion and stenosis of bilateral carotid arteries: Secondary | ICD-10-CM

## 2023-01-22 DIAGNOSIS — E785 Hyperlipidemia, unspecified: Secondary | ICD-10-CM | POA: Diagnosis not present

## 2023-01-22 DIAGNOSIS — M5416 Radiculopathy, lumbar region: Secondary | ICD-10-CM

## 2023-01-22 LAB — LIPID PANEL
Cholesterol: 125 mg/dL (ref 0–200)
HDL: 44.7 mg/dL (ref >39.00)
LDL Cholesterol: 55 mg/dL (ref 0–99)
NonHDL: 79.98
Total CHOL/HDL Ratio: 3
Triglycerides: 125 mg/dL (ref 0.0–149.0)
VLDL: 25 mg/dL (ref 0.0–40.0)

## 2023-01-22 LAB — TSH: TSH: 4.85 u[IU]/mL (ref 0.35–5.50)

## 2023-01-22 LAB — BASIC METABOLIC PANEL
BUN: 24 mg/dL — ABNORMAL HIGH (ref 6–23)
CO2: 28 mEq/L (ref 19–32)
Calcium: 9.8 mg/dL (ref 8.4–10.5)
Chloride: 104 mEq/L (ref 96–112)
Creatinine, Ser: 1.2 mg/dL (ref 0.40–1.50)
GFR: 56.15 mL/min — ABNORMAL LOW (ref >60.00)
Glucose, Bld: 118 mg/dL — ABNORMAL HIGH (ref 70–99)
Potassium: 3.7 mEq/L (ref 3.5–5.1)
Sodium: 143 mEq/L (ref 135–145)

## 2023-01-22 LAB — VITAMIN D 25 HYDROXY (VIT D DEFICIENCY, FRACTURES): VITD: 31.56 ng/mL (ref 30.00–100.00)

## 2023-01-22 LAB — HEMOGLOBIN A1C: Hgb A1c MFr Bld: 5.6 % (ref 4.6–6.5)

## 2023-01-22 NOTE — Assessment & Plan Note (Signed)
Continues cpap.  

## 2023-01-22 NOTE — Assessment & Plan Note (Signed)
Stable with OTC Tums prn.

## 2023-01-22 NOTE — Assessment & Plan Note (Signed)
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

## 2023-01-22 NOTE — Assessment & Plan Note (Signed)
Lab Results  Component Value Date   TSH 0.83 08/07/2022   Clinically stable on synthroid.

## 2023-01-22 NOTE — Assessment & Plan Note (Signed)
Check follow up level.   °

## 2023-01-22 NOTE — Assessment & Plan Note (Signed)
Lab Results  Component Value Date   HGBA1C 5.5 03/07/2022

## 2023-01-22 NOTE — Progress Notes (Signed)
Subjective:   By signing my name below, I, [name], attest that this documentation has been prepared under the direction and in the presence of [name], NP. 01/22/2023   Patient ID: [name] Pratt, male    DOB: [birth year], 82 y.o.   MRN: [id number]  Chief Complaint  Patient presents with   Hypertension    Here for follow up, patient reports he is taking amlodipine occasionally     HPI Patient is in today for a follow up visit.   Jaw pain: He complains of jaw pain and difficulty swallowing. He has difficulty swallowing food specifically.   Toe pain: He complains of pain in both his toes and is requesting to see a podiatrist. He finds mild relief while completing exercises.   Sub laminal procedure: He completed his Lumbar 2-3 3-4 sub laminal decompression and reports recovering well. He can put his feet up on his bed with out issues since his procedure. He has no falls since his procedure.  Tight skin: He reports skin tightness and occasional swelling on his calves where his previous skin infection was. He notes having swelling and tightness in both upper calves but has more numbness on his left.   Orthopedist specialist: He is requesting a referral to an orthopedist specialist to manage his bilateral knee pain and weakness.   Bowel movements: His bowel movements improved since he started eating pro-biotics regularly. He does not remember if he completed and submitted his stool screening.   Reflux: He continues having reflux and notes his symptoms typically present after bending down while standing. He is taking OTC anti-acid medication to manage his symptoms.   Thyroid: He continues taking levothyroxine regularly and reports no new issues while taking it.   Blood pressure: His blood pressure is doing well during this visit. He continues taking 5 mg amlodipine daily PO, 50 mg hydralazine 2x daily PO. He stopped taking amlodipine since last month due to feeling  dizzy and low blood pressure readings.  BP Readings from Last 3 Encounters:  01/22/23 126/61  12/30/22 (!) 158/71  12/28/22 (!) 158/81   Pulse Readings from Last 3 Encounters:  01/22/23 66  12/30/22 (!) 56  12/28/22 (!) 57   Carotid Ultra sound: His carotid US last summer showed no new changes.    Past Medical History:  Diagnosis Date   Allergic rhinitis 02/02/2016   Anemia 12/17/2009   Formatting of this note might be different from the original. 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 Hypertension   Benign paroxysmal positional vertigo 09/14/2021   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HCC) 07/22/2021   Body mass index (BMI) 25.0-25.9, adult 11/02/2020   Cancer (HCC)    skin cancer   Cardiac murmur 07/25/2021   Carotid stenosis    a. Carotid U/S 5/13: LICA < 50%, RICA 50-69%;  b.  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   Cystoid macular edema of both eyes 07/28/2020   Degenerative disc disease, lumbar 06/30/2015   Deviated nasal septum 02/18/2015   Diverticulosis 03/03/2020   DJD (degenerative joint disease) of hip 12/24/2012   Dyspnea 06/12/2011   Emphysema, unspecified (HCC) 03/05/2020   Erectile dysfunction 06/20/2017   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   Gait disorder  03/02/2021   GERD (gastroesophageal reflux disease)    GOUT 05/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   Qualifier: Diagnosis of  By: Kem Parkinson     Greater trochanteric pain syndrome 07/22/2021   Hand pain, left 01/16/2022   Hearing loss 02/02/2016   Heme positive stool 02/29/2020   HIATAL HERNIA 05/13/2009   Qualifier: Diagnosis of  By: Kem Parkinson     High risk medication use 01/18/2012   Hip pain 03/02/2021   History of chicken pox    History of COVID-19 12/05/2021   History of hemorrhoids    History of kidney stones    History of skin cancer 07/22/2021   skin cancer   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 Cardiology - Dr Estrella Myrtle at [facility] cardiology  ICD-10 cut over   Hypertension    under control   Hypokalemia 07/08/2021   Hypothyroidism    Internal hemorrhoids 03/03/2020   Leg cramps 11/13/2019   Lower GI bleed 12/12/2012   Lumbago of lumbar region with sciatica 10/21/2020   Lumbar radiculopathy 03/02/2021   Lumbar spondylosis 11/02/2020   NEPHROLITHIASIS 05/13/2009   Qualifier: Diagnosis of  By: Kem Parkinson     Nonspecific abnormal electrocardiogram (ECG) (EKG) 01/06/2013   Numbness of hand 01/24/2022   Onychomycosis 06/30/2015   Osteoarthrosis, 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 Osteoarthritis Of The Hand  10/1 IMO update   Osteoarthrosis, unspecified whether generalized or localized, pelvic region and thigh 07/25/2010   Formatting of this note might be different from the original. Osteoarthritis Of The Hip   Other abnormal glucose 07/22/2021   Other allergic rhinitis 02/18/2015   Palpitations 07/25/2021   Peripheral neuropathy 06/19/2016   Preventative health care 11/24/2015   Right groin pain 11/29/2012   Right inguinal hernia 05/13/2009   Qualifier: Diagnosis of   By: Kem Parkinson     RLS (restless legs syndrome) 02/18/2015   Rosacea    Rotator cuff tear 07/27/2013   Situational depression 03/02/2021   Skin lesion 01/16/2022   Sleep apnea with use of continuous positive airway pressure (CPAP) 07/15/2013   CPAP set to  7 cm water,  Residual AHi was 7.8  With no leak.  User time 6 hours.  479 days , not used only 12 days - highly compliant 06-23-13  .    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 Fawn Kirk   Ulnar neuropathy 01/24/2022   Urinary retention 11/26/2020   Ventral hernia 07/08/2012   Vertigo 07/08/2021   Weight loss 03/03/2020    Past Surgical History:  Procedure Laterality Date   ABDOMINAL HERNIA REPAIR  07/15/12  Dr Ashley Jacobs   ANTERIOR CERVICAL DECOMP/DISCECTOMY FUSION N/A 07/09/2020   Procedure: ANTERIOR CERVICAL DECOMPRESSION AND FUSION CERVICAL THREE-FOUR.;  Surgeon: Donalee Citrin, MD;  Location: [facility] OR;  Service: Neurosurgery;  Laterality: N/A;  anterior   BROW LIFT  05/07/01   COLONOSCOPY WITH PROPOFOL N/A 04/24/2022   Procedure: COLONOSCOPY WITH PROPOFOL;  Surgeon: Tressia Danas, MD;  Location: [facility] ENDOSCOPY;  Service: Gastroenterology;  Laterality: N/A;   COLONOSCOPY WITH PROPOFOL N/A 04/26/2022   Procedure: COLONOSCOPY WITH PROPOFOL;  Surgeon: Tressia Danas, MD;  Location: [facility] ENDOSCOPY;  Service: Gastroenterology;  Laterality: N/A;   CYSTOSCOPY WITH URETEROSCOPY AND STENT PLACEMENT Right 01/28/2014   Procedure: CYSTOSCOPY WITH URETEROSCOPY, BASKET RETRIVAL AND  STENT PLACEMENT;  Surgeon: Valetta Fuller, MD;  Location: [facility] ORS;  Service: Urology;  Laterality: Right;   epidural injections     multiple procedures   ESOPHAGEAL DILATION  1993  and 1994   multiple times   EYE SURGERY Bilateral 03/25/10, 2012   cataract removal   HEMORRHOID SURGERY  2014   HIATAL HERNIA REPAIR  12/21/93   HOLMIUM LASER APPLICATION Right 01/28/2014   Procedure: HOLMIUM LASER APPLICATION;  Surgeon: Valetta Fuller, MD;  Location: [facility] ORS;  Service: Urology;  Laterality: Right;   INGUINAL HERNIA REPAIR Right 07/15/12   Dr Ashley Jacobs, x2   JOINT REPLACEMENT  07/25/04   right knee   JOINT REPLACEMENT  07/13/10   left knee   KNEE SURGERY  1995   LAPAROSCOPIC CHOLECYSTECTOMY  09/20/06   with intraoperative cholangiogram and right inguinal herniorrhaphy with mesh    LIGAMENT REPAIR Left 07/2013   shoulder   LITHOTRIPSY     x2   LUMBAR LAMINECTOMY/DECOMPRESSION MICRODISCECTOMY Bilateral 12/27/2022   Procedure: Lumbar Two-Three, Lumbar Three-Four Sublaminar Decompression;  Surgeon: Donalee Citrin, MD;  Location: [facility] OR;  Service: Neurosurgery;  Laterality: Bilateral;   NASAL SEPTUM SURGERY  1975   POLYPECTOMY  04/26/2022   Procedure: POLYPECTOMY;  Surgeon: Tressia Danas, MD;  Location: [facility] ENDOSCOPY;  Service: Gastroenterology;;   POSTERIOR CERVICAL FUSION/FORAMINOTOMY N/A 11/24/2020   Procedure: Posterior Cervical Laminectomy Cervical three-four, Cervical four-five with lateral mass fusion;  Surgeon: Donalee Citrin, MD;  Location: [facility] OR;  Service: Neurosurgery;  Laterality: N/A;   REFRACTIVE SURGERY Bilateral    Right total hip replacement  4/14   SKIN CANCER DESTRUCTION     nose, ear   TONSILLECTOMY  as child   TOTAL HIP ARTHROPLASTY Left    URETHRAL DILATION  1991   Dr. Annabell Howells    Family History  Problem Relation Age of Onset   Cancer Mother    Hypertension Other    Cancer Other    Osteoarthritis Other    Stomach cancer Neg Hx    Colon cancer Neg Hx    Esophageal cancer Neg Hx     Social History   Socioeconomic History   Marital status: Married    Spouse name: Britta Mccreedy   Number of children: 1   Years of education: Not on file   Highest education  level: Not on file  Occupational History   Occupation: firefighter    Comment: paints on the side   Occupation: retired  Tobacco Use   Smoking status: Former    Years: 11    Types: Cigarettes    Quit date: 09/11/1978    Years since quitting: 44.3   Smokeless tobacco: Never  Vaping Use   Vaping Use: Never used  Substance and Sexual Activity   Alcohol use: Not Currently  Alcohol/week: 0.0 standard drinks of alcohol    Comment: rare   Drug use: No   Sexual activity: Not Currently  Other Topics Concern   Not on file  Social History Narrative   Lives with his wife   He has one son- lives locally.   2 grandchildren   Has worked for Warden/ranger   Enjoys wood working   Completed HS.  [employer]   Social Determinants of Health   Financial Resource Strain: Low Risk  (10/28/2021)   Overall Financial Resource Strain (CARDIA)    Difficulty of Paying Living Expenses: Not very hard  Food Insecurity: Unknown (06/05/2022)   Hunger Vital Sign    Worried About Running Out of Food in the Last Year: Never true    Ran Out of Food in the Last Year: Not on file  Transportation Needs: Unknown (06/05/2022)   PRAPARE - Administrator, Civil Service (Medical): No    Lack of Transportation (Non-Medical): Not on file  Physical Activity: Insufficiently Active (01/09/2023)   Exercise Vital Sign    Days of Exercise per Week: 2 days    Minutes of Exercise per Session: 10 min  Stress: Stress Concern Present (01/09/2023)   [employer] of Occupational Health - Occupational Stress Questionnaire    Feeling of Stress : To some extent  Social Connections: Moderately Isolated (01/09/2023)   Social Connection and Isolation Panel [NHANES]    Frequency of Communication with Friends and Family: Three times a week    Frequency of Social Gatherings with Friends and Family: Three times a week    Attends Religious Services: Never    Active Member of Clubs or Organizations: No    Attends Occupational hygienist Meetings: Never    Marital Status: Married  Catering manager Violence: Not At Risk (01/19/2022)   Humiliation, Afraid, Rape, and Kick questionnaire    Fear of Current or Ex-Partner: No    Emotionally Abused: No    Physically Abused: No    Sexually Abused: No    Outpatient Medications Prior to Visit  Medication Sig Dispense Refill   acetaminophen (TYLENOL) 500 MG tablet Take 500 mg by mouth 2 (two) times daily.     albuterol (VENTOLIN HFA) 108 (90 Base) MCG/ACT inhaler INHALE 2 PUFFS INTO THE LUNGS EVERY 6 HOURS AS NEEDED FOR SHORTNESS OF BREATH. (Patient taking differently: Inhale 2 puffs into the lungs every 6 (six) hours as needed for shortness of breath.) 1 each 3   allopurinol (ZYLOPRIM) 100 MG tablet TAKE 1 TABLET (100 MG TOTAL) BY MOUTH 2 (TWO) TIMES DAILY. 180 tablet 1   antiseptic oral rinse (BIOTENE) LIQD 15 mLs by Mouth Rinse route 2 (two) times daily.     aspirin EC 81 MG tablet Take 81 mg by mouth at bedtime.     bacitracin ointment Apply 1 Application topically 2 (two) times daily. (Patient taking differently: Apply 1 Application topically daily as needed for wound care.) 120 g 0   cholestyramine (QUESTRAN) 4 GM/DOSE powder Take by mouth daily. 30 ml     cycloSPORINE (RESTASIS) 0.05 % ophthalmic emulsion Place 1 drop into both eyes 2 (two) times daily.     docusate sodium (COLACE) 100 MG capsule Take 1 capsule (100 mg total) by mouth every 12 (twelve) hours. 60 capsule 0   fluticasone (FLONASE) 50 MCG/ACT nasal spray Place 1 spray into both nostrils daily as needed for allergies.     gabapentin (NEURONTIN) 300 MG capsule Take 300  mg by mouth at bedtime.     GEMTESA 75 MG TABS Take 75 mg by mouth daily.     glucose blood test strip TRUE Metrix Blood Glucose Test Strip, use as instructed 100 each 12   hydrALAZINE (APRESOLINE) 50 MG tablet Take 1 tablet (50 mg total) by mouth 2 (two) times daily. 180 tablet 1   Iron, Ferrous Sulfate, 325 (65 Fe) MG TABS Take 325 mg  by mouth every other day. (Patient taking differently: Take 325 mg by mouth daily.) 30 tablet 0   Lancets MISC 1 each by Does not apply route 2 (two) times daily. TRUE matrix lancets 100 each 3   levothyroxine (SYNTHROID) 150 MCG tablet TAKE 1 TABLET ON 6 DAYS PER WEEK (TAKE 75 MCG ONE TIME PER WEEK) 78 tablet 1   levothyroxine (SYNTHROID) 75 MCG tablet Take 75 mcg once a week (patient on 150 mcg 6 days a week) Dispense #24 for 90 day supply. (Patient taking differently: Take 75 mcg once a week (patient on 150 mcg 6 days a week) Dispense #24 for 90 day supply. Sunday) 12 tablet 1   loratadine (CLARITIN) 10 MG tablet Take 10 mg by mouth daily as needed for allergies or rhinitis.     losartan (COZAAR) 100 MG tablet TAKE 1 TABLET EVERY DAY 90 tablet 1   Magnesium 500 MG TABS Take 500 mg by mouth daily.     Menthol, Topical Analgesic, (ICY HOT EX) Apply 1 Application topically at bedtime. Lidocaine / on back     metroNIDAZOLE (METROCREAM) 0.75 % cream Apply 1 Application topically daily as needed (head).     Multiple Vitamins-Minerals (MULTIVITAMIN WITH MINERALS) tablet Take 1 tablet by mouth daily.     multivitamin-lutein (OCUVITE-LUTEIN) CAPS capsule Take 1 capsule by mouth daily.     nitroGLYCERIN (NITROSTAT) 0.4 MG SL tablet Place 1 tablet (0.4 mg total) under the tongue every 5 (five) minutes as needed for chest pain. 25 tablet 6   OVER THE COUNTER MEDICATION Place 1 spray into both nostrils 2 (two) times daily. Sinus saline     OVER THE COUNTER MEDICATION Take 15 mLs by mouth at bedtime. Yellow Mustard     polyethylene glycol powder (GLYCOLAX/MIRALAX) 17 GM/SCOOP powder Take 17 g by mouth 2 (two) times daily as needed for mild constipation, moderate constipation or severe constipation. 255 g 0   polyvinyl alcohol (ARTIFICIAL TEARS) 1.4 % ophthalmic solution Place 1 drop into both eyes daily as needed for dry eyes.     PREBIOTIC PRODUCT PO Take 1 tablet by mouth daily.     PRESCRIPTION MEDICATION  CPAP- At bedtime and during any time of rest     Probiotic Product (PROBIOTIC DAILY PO) Take 1 capsule by mouth daily as needed (Constipation).     rosuvastatin (CRESTOR) 5 MG tablet TAKE 1 TABLET EVERY DAY 90 tablet 1   vitamin B-12 (CYANOCOBALAMIN) 500 MCG tablet Take 500 mcg by mouth in the morning.     amLODipine (NORVASC) 5 MG tablet Take 1 tablet (5 mg total) by mouth daily. 90 tablet 1   No facility-administered medications prior to visit.    Allergies  Allergen Reactions   Pravastatin Other (See Comments)    Muscle cramps   Sildenafil Other (See Comments)    Tachycardia     Oxycodone Other (See Comments)    Hallucinations    Atenolol Other (See Comments)    Bradycardia    Elastic Bandages & [Zinc] Other (See Comments)  Teflon bandages - Irritation   Metoclopramide Hcl Anxiety    Review of Systems  HENT:         (+)difficulty swallowing  Musculoskeletal:        (+)jaw pain (+)bilateral pain in toes (+)bilateral knee pain  Skin:        (+)skin tightness on bilateral upper calves (+)occasional swelling in bilateral upper calves  Neurological:  Positive for weakness (bilateral knees).       Objective:    Physical Exam Constitutional:      General: He is not in acute distress.    Appearance: Normal appearance. He is not ill-appearing.  HENT:     Head: Normocephalic and atraumatic.     Right Ear: External ear normal.     Left Ear: External ear normal.  Eyes:     Extraocular Movements: Extraocular movements intact.     Pupils: Pupils are equal, round, and reactive to light.  Cardiovascular:     Rate and Rhythm: Normal rate and regular rhythm.     Heart sounds: Normal heart sounds. No murmur heard.    No gallop.  Pulmonary:     Effort: Pulmonary effort is normal. No respiratory distress.     Breath sounds: Normal breath sounds. No wheezing or rales.  Skin:    General: Skin is warm and dry.  Neurological:     Mental Status: He is alert and oriented to  person, place, and time.  Psychiatric:        Judgment: Judgment normal.     BP 126/61 (BP Location: Right Arm, Patient Position: Sitting, Cuff Size: Small)   Pulse 66   Temp 97.6 F (36.4 C) (Oral)   Resp 16   Wt 157 lb (71.2 kg)   SpO2 99%   BMI 25.34 kg/m  Wt Readings from Last 3 Encounters:  01/22/23 157 lb (71.2 kg)  12/30/22 158 lb 11.7 oz (72 kg)  12/27/22 159 lb (72.1 kg)       Assessment & Plan:  Hypothyroidism, unspecified type Assessment & Plan: Lab Results  Component Value Date   TSH 0.83 08/07/2022   Clinically stable on synthroid.   Orders: -     TSH  Chronic gout of left foot, unspecified cause Assessment & Plan: Stable without recent flare. Continues allopurinol.    Benign essential hypertension Assessment & Plan: BP Readings from Last 3 Encounters:  01/22/23 126/61  12/30/22 (!) 158/71  12/28/22 (!) 158/81   Has not needed am  Orders: -     Basic metabolic panel  OSA (obstructive sleep apnea) Assessment & Plan: Continues cpap.    Bilateral carotid artery stenosis Assessment & Plan: Stable [place] last summer.    Gastroesophageal reflux disease, unspecified whether esophagitis present Assessment & Plan: Stable with OTC Tums prn.    Hyperglycemia Assessment & Plan: Lab Results  Component Value Date   HGBA1C 5.5 03/07/2022     Orders: -     Hemoglobin A1c  Lumbar radiculopathy Assessment & Plan: Improved following surgery.    Vitamin D deficiency Assessment & Plan: Check follow up level.   Orders: -     VITAMIN D 25 Hydroxy (Vit-D Deficiency, Fractures)  Chronic pain of both knees -     Ambulatory referral to Orthopedics  Hyperlipidemia, unspecified hyperlipidemia type Assessment & Plan: Lab Results  Component Value Date   CHOL 117 09/13/2021   HDL 50.40 09/13/2021   LDLCALC 50 09/13/2021   TRIG 80.0 09/13/2021   CHOLHDL 2 09/13/2021  Tolerating statin, update lipid panel.   Orders: -     Lipid  panel  Bilateral foot pain -     Ambulatory referral to Podiatry    I, Lemont Fillers, NP, personally preformed the services described in this documentation.  All medical record entries made by the scribe were at my direction and in my presence.  I have reviewed the chart and discharge instructions (if applicable) and agree that the record reflects my personal performance and is accurate and complete. 01/22/2023   I,[name],acting as a scribe for Lemont Fillers, NP.,have documented all relevant documentation on the behalf of Lemont Fillers, NP,as directed by  Lemont Fillers, NP while in the presence of Lemont Fillers, NP.   Lemont Fillers, NP

## 2023-01-22 NOTE — Assessment & Plan Note (Signed)
Stable [place] last summer.

## 2023-01-22 NOTE — Assessment & Plan Note (Signed)
BP Readings from Last 3 Encounters:  01/22/23 126/61  12/30/22 (!) 158/71  12/28/22 (!) 158/81   Has not needed am

## 2023-01-22 NOTE — Assessment & Plan Note (Signed)
>>  ASSESSMENT AND PLAN FOR BENIGN ESSENTIAL HYPERTENSION WRITTEN ON 01/22/2023 11:31 AM BY O'SULLIVAN, Milani Lowenstein, NP  BP Readings from Last 3 Encounters:  01/22/23 126/61  12/30/22 (!) 158/71  12/28/22 (!) 158/81   Has not needed am

## 2023-01-22 NOTE — Assessment & Plan Note (Addendum)
Lab Results  Component Value Date   CHOL 117 09/13/2021   HDL 50.40 09/13/2021   LDLCALC 50 09/13/2021   TRIG 80.0 09/13/2021   CHOLHDL 2 09/13/2021   Tolerating statin, update lipid panel.

## 2023-01-22 NOTE — Assessment & Plan Note (Addendum)
Stable without recent flare. Continues allopurinol.

## 2023-01-22 NOTE — Assessment & Plan Note (Signed)
Improved following surgery 

## 2023-01-25 ENCOUNTER — Ambulatory Visit: Payer: Medicare HMO | Admitting: Podiatry

## 2023-01-25 ENCOUNTER — Encounter: Payer: Self-pay | Admitting: Podiatry

## 2023-01-25 DIAGNOSIS — M79675 Pain in left toe(s): Secondary | ICD-10-CM

## 2023-01-25 DIAGNOSIS — L84 Corns and callosities: Secondary | ICD-10-CM | POA: Diagnosis not present

## 2023-01-25 DIAGNOSIS — M2042 Other hammer toe(s) (acquired), left foot: Secondary | ICD-10-CM | POA: Diagnosis not present

## 2023-01-25 DIAGNOSIS — B351 Tinea unguium: Secondary | ICD-10-CM | POA: Diagnosis not present

## 2023-01-25 DIAGNOSIS — M79674 Pain in right toe(s): Secondary | ICD-10-CM

## 2023-01-25 NOTE — Progress Notes (Signed)
Subjective:  Patient ID: [name] Pratt, male    DOB: [birth year],   MRN: [id number]  Chief Complaint  Patient presents with   Toe Pain    Patient cam I  today for left foot 5th toe pain, patient has some redness on the toe, patient is also having numbness, and burning, in the balls of the foot at night time, patient has had back surgery 3 times TX: Gabapentin     82 y.o. male presents for concern of a lesion on the inside of his fifth toe that has been very painful. Relates he normally keeps some cotton in between but has not in a while. 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  Denies any other pedal complaints. Denies n/v/f/c.   Past Medical History:  Diagnosis Date   Allergic rhinitis 02/02/2016   Anemia 12/17/2009   Formatting of this note might be different from the original. 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 Hypertension   Benign paroxysmal positional vertigo 09/14/2021   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HCC) 07/22/2021   Body mass index (BMI) 25.0-25.9, adult 11/02/2020   Cancer (HCC)    skin cancer   Cardiac murmur 07/25/2021   Carotid stenosis    a. Carotid U/S 5/13: LICA < 50%, RICA 50-69%;  b.  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   Cystoid macular edema of both eyes 07/28/2020   Degenerative disc disease, lumbar 06/30/2015    Deviated nasal septum 02/18/2015   Diverticulosis 03/03/2020   DJD (degenerative joint disease) of hip 12/24/2012   Dyspnea 06/12/2011   Emphysema, unspecified (HCC) 03/05/2020   Erectile dysfunction 06/20/2017   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   Gait disorder 03/02/2021   GERD (gastroesophageal reflux disease)    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   Qualifier: Diagnosis of  By: Kem Parkinson     Greater trochanteric pain syndrome 07/22/2021   Hand pain, left 01/16/2022   Hearing loss 02/02/2016   Heme positive stool 02/29/2020   HIATAL HERNIA 05/13/2009   Qualifier: Diagnosis of  By: Kem Parkinson     High risk medication use 01/18/2012   Hip pain 03/02/2021   History of chicken pox    History of COVID-19 12/05/2021   History of hemorrhoids    History of kidney stones    History of skin cancer 07/22/2021   skin cancer   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 Osteoarthritis Of The Hand  10/1 IMO update   Osteoarthrosis, unspecified whether generalized or localized, pelvic region and thigh 07/25/2010    Formatting of this note might be different from the original. Osteoarthritis Of The Hip   Other abnormal glucose 07/22/2021   Other allergic rhinitis 02/18/2015   Palpitations 07/25/2021   Peripheral neuropathy 06/19/2016   Preventative health care 11/24/2015   Right groin pain 11/29/2012   Right inguinal hernia 05/13/2009   Qualifier: Diagnosis of  By: Kem Parkinson     RLS (restless legs syndrome) 02/18/2015   Rosacea    Rotator cuff tear 07/27/2013   Situational depression 03/02/2021   Skin lesion 01/16/2022   Sleep apnea with use of continuous positive airway pressure (CPAP) 07/15/2013   CPAP set to  7 cm water,  Residual AHi was 7.8  With no leak.  User time 6 hours.  479 days , not used only 12 days - highly compliant 06-23-13  .    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 CFT <3 seconds. Normal hair growth on digits. No edema.  Skin. No lacerations or abrasions bilateral feet. Hyperkeratotic cored lesion noted to medial fifth digit on left. Nails 1-5 bilateral are thickened elongated and discolored Musculoskeletal: MMT 5/5 bilateral lower extremities in DF, PF, Inversion and Eversion. Deceased ROM in DF of ankle joint. Adductovarus of bilateral fifth digits.  Neurological: Sensation intact to light touch.   Assessment:   1. Heloma molle   2. Hammertoe of left foot   3. Pain due to onychomycosis of toenails of both feet      Plan:  Patient was evaluated and treated  and all questions answered. -Discussed corns and calluses with patient and treatment options.  -Hyperkeratotic tissue was debrided with chisel without incident as courtesy.  -Mechaniccaly debrided nails 1-5 bilateral as courtesy. \ -Toe cap provided.  -Encouraged daily moisturizing -Discussed use of pumice stone -Advised good supportive shoes and inserts -Patient to return to office as needed or sooner if condition worsens. Louann Sjogren, DPM

## 2023-01-31 ENCOUNTER — Telehealth: Payer: Self-pay | Admitting: Family

## 2023-01-31 DIAGNOSIS — M25562 Pain in left knee: Secondary | ICD-10-CM | POA: Diagnosis not present

## 2023-01-31 DIAGNOSIS — T8484XA Pain due to internal orthopedic prosthetic devices, implants and grafts, initial encounter: Secondary | ICD-10-CM | POA: Diagnosis not present

## 2023-01-31 DIAGNOSIS — Z96652 Presence of left artificial knee joint: Secondary | ICD-10-CM | POA: Diagnosis not present

## 2023-01-31 DIAGNOSIS — Z96653 Presence of artificial knee joint, bilateral: Secondary | ICD-10-CM | POA: Diagnosis not present

## 2023-01-31 DIAGNOSIS — M25561 Pain in right knee: Secondary | ICD-10-CM | POA: Diagnosis not present

## 2023-01-31 DIAGNOSIS — Z96651 Presence of right artificial knee joint: Secondary | ICD-10-CM | POA: Diagnosis not present

## 2023-01-31 DIAGNOSIS — G8929 Other chronic pain: Secondary | ICD-10-CM | POA: Diagnosis not present

## 2023-01-31 NOTE — Telephone Encounter (Signed)
Pt said he has already seen someone for his feet (appears to be [facility]) but he received a letter from [facility] with an appt on 02/19/23. Pt said he doesn't know why he has seen 3 doctors for his feet. Advised that one of his referrals was for podiatry and one was for his knees (ortho). I do not see a referral for [facility]. Pt would like a call back to discuss.

## 2023-02-01 NOTE — Telephone Encounter (Signed)
Patient was given number to [facility]

## 2023-02-02 ENCOUNTER — Telehealth: Payer: Self-pay | Admitting: Family

## 2023-02-02 NOTE — Telephone Encounter (Signed)
Patient advised per [name] NP she does not advise for patient to take this due to his kidney function.  He verbalized understanding and he will continue to take tylenol as advised by pcp

## 2023-02-02 NOTE — Telephone Encounter (Signed)
Pt called stating that the Ortho specialist had put him on meloxicam and he was worried that he might have issues with this as he had heard if have had issues with bleeding or heart problems, you are not supposed to take it.

## 2023-02-12 DIAGNOSIS — D692 Other nonthrombocytopenic purpura: Secondary | ICD-10-CM | POA: Diagnosis not present

## 2023-02-12 DIAGNOSIS — L718 Other rosacea: Secondary | ICD-10-CM | POA: Diagnosis not present

## 2023-02-12 DIAGNOSIS — Z85828 Personal history of other malignant neoplasm of skin: Secondary | ICD-10-CM | POA: Diagnosis not present

## 2023-02-12 DIAGNOSIS — L249 Irritant contact dermatitis, unspecified cause: Secondary | ICD-10-CM | POA: Diagnosis not present

## 2023-02-12 DIAGNOSIS — Z129 Encounter for screening for malignant neoplasm, site unspecified: Secondary | ICD-10-CM | POA: Diagnosis not present

## 2023-02-12 DIAGNOSIS — L570 Actinic keratosis: Secondary | ICD-10-CM | POA: Diagnosis not present

## 2023-02-13 ENCOUNTER — Ambulatory Visit: Payer: Self-pay | Admitting: Licensed Clinical Social Worker

## 2023-02-13 NOTE — Patient Outreach (Signed)
  Care Coordination   Follow Up Visit Note   02/13/2023 Name: [name] Pratt MRN: [id number] DOB: 03/31/1941  [name] Pratt is a 82 y.o. year old male who sees [name] Craze, NP for primary care. I spoke with  [name] Pratt by phone today.  What matters to the patients health and wellness today?  Patient is recovering from back surgery, He uses a cane or a walker as needed to help him walk     Goals Addressed             This Visit's Progress    Patient is recovering from back surgery, He uses a cane or a walker as needed to help him walk       Interventions:  Spoke with client about client needs Discussed client ambulation. He uses a cane or a walker as needed to help him walk Discussed medication procurement Discussed mood of client. He feels that his mood is stable. Client likes to watch TV. Client likes to go out to eat occasionally. Client likes to spend time on computer Discussed family support with wife. Reviewed program support for client with RN, Pharmacist and LCSW Discussed hand movements. Discussed utensil use. Discussed ability to hold glass of liquid Client spoke of occasional numbness in his hands. He has talked with NP about this issue with his hands Provided counseling support Discussed vision of client. He uses reading glasses Discussed client appointments.  Discussed ADLs completion.  Encouraged client to call LCSW as needed at [phone].        SDOH assessments and interventions completed:  Yes  SDOH Interventions Today    Flowsheet Row Most Recent Value  SDOH Interventions   Physical Activity Interventions Other (Comments)  [uses a cane or a walker as needed to help him walk]  Stress Interventions Other (Comment)  [client has some stress related to managing medical needs]        Care Coordination Interventions:  Yes, provided   Interventions Today    Flowsheet Row Most Recent Value  Chronic Disease   Chronic disease  during today's visit Other  [spoke with client about client needs]  General Interventions   General Interventions Discussed/Reviewed General Interventions Discussed, [employer]  [discussed program resources]  Exercise Interventions   Exercise Discussed/Reviewed Physical Activity  [uses a cane or a walker as needed to help him walk]  Education Interventions   Education Provided Provided Education  Provided Verbal Education On [employer]  Mental Health Interventions   Mental Health Discussed/Reviewed Anxiety, Coping Strategies  [discussed mood of client. client feels that his mood is stable]  Pharmacy Interventions   Pharmacy Dicussed/Reviewed Pharmacy Topics Discussed  Safety Interventions   Safety Discussed/Reviewed Fall Risk        Follow up plan: Follow up call scheduled for 04/09/23 at 2:30 PM     Encounter Outcome:  Pt. Visit Completed   [name] Pratt.[name] Pratt MSW, LCSW Licensed Visual merchandiser [facility] Care Management [phone]

## 2023-02-13 NOTE — Patient Instructions (Signed)
Visit Information  Thank you for taking time to visit with me today. Please don't hesitate to contact me if I can be of assistance to you.   Following are the goals we discussed today:   Goals Addressed             This Visit's Progress    Patient is recovering from back surgery, He uses a cane or a walker as needed to help him walk       Interventions:  Spoke with client about client needs Discussed client ambulation. He uses a cane or a walker as needed to help him walk Discussed medication procurement Discussed mood of client. He feels that his mood is stable. Client likes to watch TV. Client likes to go out to eat occasionally. Client likes to spend time on computer Discussed family support with wife. Reviewed program support for client with RN, Pharmacist and LCSW Discussed hand movements. Discussed utensil use. Discussed ability to hold glass of liquid Client spoke of occasional numbness in his hands. He has talked with NP about this issue with his hands Provided counseling support Discussed vision of client. He uses reading glasses Discussed client appointments.  Discussed ADLs completion.  Encouraged client to call LCSW as needed at [phone].        Our next appointment is by telephone on 04/09/23 at 2:30 PM   Please call the care guide team at [phone] if you need to cancel or reschedule your appointment.   If you are experiencing a Mental Health or Behavioral Health Crisis or need someone to talk to, please go to [facility] [address] [place] [phone])   The patient verbalized understanding of instructions, educational materials, and care plan provided today and DECLINED offer to receive copy of patient instructions, educational materials, and care plan.   The patient has been provided with contact information for the care management team and has been advised to call with any health related questions or  concerns.   Kelton Pillar.Kailon Treese MSW, LCSW Licensed Visual merchandiser [facility] Care Management [phone]

## 2023-02-19 DIAGNOSIS — H35353 Cystoid macular degeneration, bilateral: Secondary | ICD-10-CM | POA: Diagnosis not present

## 2023-02-19 DIAGNOSIS — H353211 Exudative age-related macular degeneration, right eye, with active choroidal neovascularization: Secondary | ICD-10-CM | POA: Diagnosis not present

## 2023-02-19 DIAGNOSIS — H35073 Retinal telangiectasis, bilateral: Secondary | ICD-10-CM | POA: Diagnosis not present

## 2023-02-22 ENCOUNTER — Ambulatory Visit: Payer: Medicare HMO | Admitting: Physical Therapy

## 2023-02-22 DIAGNOSIS — M25559 Pain in unspecified hip: Secondary | ICD-10-CM | POA: Diagnosis not present

## 2023-02-22 DIAGNOSIS — M25551 Pain in right hip: Secondary | ICD-10-CM | POA: Diagnosis not present

## 2023-02-22 DIAGNOSIS — M48062 Spinal stenosis, lumbar region with neurogenic claudication: Secondary | ICD-10-CM | POA: Diagnosis not present

## 2023-02-26 ENCOUNTER — Other Ambulatory Visit: Payer: Self-pay | Admitting: Family

## 2023-02-26 ENCOUNTER — Ambulatory Visit: Payer: Medicare HMO

## 2023-03-08 NOTE — Therapy (Signed)
OUTPATIENT PHYSICAL THERAPY LOWER EXTREMITY EVALUATION   Patient Name: [name] Pratt MRN: [id number] DOB:September 27, 1940, 82 y.o., male Today's Date: 03/13/2023   END OF SESSION:  PT End of Session - 03/13/23 1445     Visit Number 1    Date for PT Re-Evaluation 05/08/23    Authorization Type [name]    PT Start Time 1445    PT Stop Time 1534    PT Time Calculation (min) 49 min    Activity Tolerance Patient tolerated treatment well    Behavior During Therapy [facility] for tasks assessed/performed             Past Medical History:  Diagnosis Date   Allergic rhinitis 02/02/2016   Anemia 12/17/2009   Formatting of this note might be different from the original. 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 Hypertension   Benign paroxysmal positional vertigo 09/14/2021   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HCC) 07/22/2021   Body mass index (BMI) 25.0-25.9, adult 11/02/2020   Cancer (HCC)    skin cancer   Cardiac murmur 07/25/2021   Carotid stenosis    a. Carotid U/S 5/13: LICA < 50%, RICA 50-69%;  b.  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   Cystoid macular edema of both eyes 07/28/2020   Degenerative disc disease, lumbar 06/30/2015   Deviated nasal septum 02/18/2015   Diverticulosis 03/03/2020   DJD (degenerative joint disease) of hip 12/24/2012   Dyspnea  06/12/2011   Emphysema, unspecified (HCC) 03/05/2020   Erectile dysfunction 06/20/2017   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   Gait disorder 03/02/2021   GERD (gastroesophageal reflux disease)    GOUT 05/13/2009   Qualifier: Diagnosis of  By: Kem Parkinson     Greater trochanteric pain syndrome 07/22/2021   Hand pain, left 01/16/2022   Hearing loss 02/02/2016   Heme positive stool 02/29/2020   HIATAL HERNIA 05/13/2009   Qualifier: Diagnosis of  By: Kem Parkinson     High risk medication use 01/18/2012   Hip pain 03/02/2021   History of chicken pox    History of COVID-19 12/05/2021   History of hemorrhoids    History of kidney stones    History of skin cancer 07/22/2021   skin cancer   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 Cardiology - Dr Estrella Myrtle at [facility] cardiology  ICD-10 cut over   Hypertension    under control   Hypokalemia 07/08/2021   Hypothyroidism    Internal hemorrhoids 03/03/2020   Leg cramps 11/13/2019   Lower GI bleed 12/12/2012   Lumbago of lumbar region with sciatica 10/21/2020   Lumbar radiculopathy 03/02/2021   Lumbar spondylosis 11/02/2020  NEPHROLITHIASIS 05/13/2009   Qualifier: Diagnosis of  By: Kem Parkinson     Nonspecific abnormal electrocardiogram (ECG) (EKG) 01/06/2013   Numbness of hand 01/24/2022   Onychomycosis 06/30/2015   Osteoarthrosis, 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 Osteoarthritis Of The Hand  10/1 IMO update   Osteoarthrosis, unspecified whether generalized or localized, pelvic region and thigh 07/25/2010   Formatting of this note might be different from the original. Osteoarthritis Of The Hip   Other abnormal glucose 07/22/2021   Other  allergic rhinitis 02/18/2015   Palpitations 07/25/2021   Peripheral neuropathy 06/19/2016   Preventative health care 11/24/2015   Right groin pain 11/29/2012   Right inguinal hernia 05/13/2009   Qualifier: Diagnosis of  By: Kem Parkinson     RLS (restless legs syndrome) 02/18/2015   Rosacea    Rotator cuff tear 07/27/2013   Situational depression 03/02/2021   Skin lesion 01/16/2022   Sleep apnea with use of continuous positive airway pressure (CPAP) 07/15/2013   CPAP set to  7 cm water,  Residual AHi was 7.8  With no leak.  User time 6 hours.  479 days , not used only 12 days - highly compliant 06-23-13  .    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 Fawn Kirk   Ulnar neuropathy 01/24/2022   Urinary retention 11/26/2020   Ventral hernia 07/08/2012   Vertigo 07/08/2021   Weight loss 03/03/2020   Past Surgical History:  Procedure Laterality Date   ABDOMINAL HERNIA REPAIR  07/15/12   Dr Ashley Jacobs   ANTERIOR CERVICAL DECOMP/DISCECTOMY FUSION N/A 07/09/2020   Procedure: ANTERIOR CERVICAL DECOMPRESSION AND FUSION CERVICAL THREE-FOUR.;  Surgeon: Donalee Citrin, MD;  Location: [facility] OR;  Service: Neurosurgery;  Laterality: N/A;  anterior   BROW LIFT  05/07/01   COLONOSCOPY WITH PROPOFOL N/A 04/24/2022   Procedure: COLONOSCOPY WITH PROPOFOL;  Surgeon: Tressia Danas, MD;  Location: [facility] ENDOSCOPY;  Service: Gastroenterology;  Laterality: N/A;   COLONOSCOPY WITH PROPOFOL N/A 04/26/2022   Procedure: COLONOSCOPY WITH PROPOFOL;  Surgeon: Tressia Danas, MD;  Location: [facility] ENDOSCOPY;  Service: Gastroenterology;  Laterality: N/A;   CYSTOSCOPY WITH URETEROSCOPY AND STENT PLACEMENT Right 01/28/2014   Procedure:  CYSTOSCOPY WITH URETEROSCOPY, BASKET RETRIVAL AND  STENT PLACEMENT;  Surgeon: Valetta Fuller, MD;  Location: [facility] ORS;  Service: Urology;  Laterality: Right;   epidural injections     multiple procedures   ESOPHAGEAL DILATION  1993 and 1994   multiple times   EYE SURGERY Bilateral 03/25/10, 2012   cataract removal   HEMORRHOID SURGERY  2014   HIATAL HERNIA REPAIR  12/21/93   HOLMIUM LASER APPLICATION Right 01/28/2014   Procedure: HOLMIUM LASER APPLICATION;  Surgeon: Valetta Fuller, MD;  Location: [facility] ORS;  Service: Urology;  Laterality: Right;   INGUINAL HERNIA REPAIR Right 07/15/12   Dr Ashley Jacobs, x2   JOINT REPLACEMENT  07/25/04   right knee   JOINT REPLACEMENT  07/13/10   left knee   KNEE SURGERY  1995   LAPAROSCOPIC CHOLECYSTECTOMY  09/20/06   with intraoperative cholangiogram and right inguinal herniorrhaphy with mesh    LIGAMENT REPAIR Left 07/2013   shoulder   LITHOTRIPSY     x2  LUMBAR LAMINECTOMY/DECOMPRESSION MICRODISCECTOMY Bilateral 12/27/2022   Procedure: Lumbar Two-Three, Lumbar Three-Four Sublaminar Decompression;  Surgeon: Donalee Citrin, MD;  Location: [facility] OR;  Service: Neurosurgery;  Laterality: Bilateral;   NASAL SEPTUM SURGERY  1975   POLYPECTOMY  04/26/2022   Procedure: POLYPECTOMY;  Surgeon: Tressia Danas, MD;  Location: [facility] ENDOSCOPY;  Service: Gastroenterology;;   POSTERIOR CERVICAL FUSION/FORAMINOTOMY N/A 11/24/2020   Procedure: Posterior Cervical Laminectomy Cervical three-four, Cervical four-five with lateral mass fusion;  Surgeon: Donalee Citrin, MD;  Location: [facility] OR;  Service: Neurosurgery;  Laterality: N/A;   REFRACTIVE SURGERY Bilateral    Right total hip replacement  4/14   SKIN CANCER DESTRUCTION     nose, ear   TONSILLECTOMY  as child   TOTAL HIP ARTHROPLASTY Left    URETHRAL DILATION  1991   Dr. Annabell Howells   Patient Active Problem List   Diagnosis Date Noted   Spinal stenosis of lumbar region 12/27/2022   Drowsiness 12/07/2022   Preop examination 12/06/2022    Weakness of lower extremity 10/10/2022   Skin tear of right elbow without complication 09/18/2022   Skin tear of left lower leg without complication 09/18/2022   Fall 09/18/2022   Iron deficiency anemia 09/18/2022   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   Vitamin D deficiency 08/07/2022   Closed fracture of left hip (HCC) 08/07/2022   Hand weakness 05/02/2022   Acute kidney injury (HCC) 05/02/2022   Adenomatous polyp of ascending colon    Adenomatous polyp of colon    Acute metabolic encephalopathy 04/24/2022   GI bleed 04/21/2022   Vasovagal syncope    Hemorrhagic shock (HCC)    Hematochezia    Irregular heart beat 02/03/2022   Constipation 02/03/2022   Carpal tunnel syndrome 01/24/2022   Numbness of hand 01/24/2022   Ulnar neuropathy 01/24/2022   Skin lesion 01/16/2022   History of COVID-19 12/05/2021   Benign paroxysmal positional vertigo 09/14/2021   Palpitations 07/25/2021   Cardiac murmur 07/25/2021   History of chicken pox 07/22/2021   History of hemorrhoids 07/22/2021   Hypertension 07/22/2021   COPD (chronic obstructive pulmonary disease) (HCC) 07/22/2021   History of kidney stones 07/22/2021   History of skin cancer 07/22/2021   Complication of anesthesia 07/22/2021   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HCC) 07/22/2021   Other abnormal glucose 07/22/2021   Spondylitis (HCC) 07/22/2021   Greater trochanteric pain syndrome 07/22/2021   Vertigo 07/08/2021   Hypokalemia 07/08/2021   Trochanteric bursitis of left hip 06/30/2021   Cancer (HCC) 04/11/2021   Lumbar radiculopathy 03/02/2021   Gait instability 03/02/2021   Hip pain 03/02/2021   Situational depression 03/02/2021   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   Urinary retention 11/26/2020   Cervical myelopathy (HCC) 11/24/2020   Body mass index (BMI) 25.0-25.9, adult 11/02/2020   Lumbar spondylosis 11/02/2020   Lumbago of lumbar region with  sciatica 10/21/2020   Cystoid macular edema of both eyes 07/28/2020   Status post cervical spinal fusion 07/09/2020   History of total bilateral knee replacement 07/08/2020   Aortic atherosclerosis (HCC) 03/05/2020   Emphysema, unspecified (HCC) 03/05/2020   Arthritis 03/04/2020   Diverticulosis 03/03/2020   Internal hemorrhoids 03/03/2020   Weight loss 03/03/2020   Heme positive stool 02/29/2020   Leg cramps 11/13/2019   Tibialis anterior tendon tear, nontraumatic 11/13/2019   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 07/29/2018   CPAP (continuous positive airway pressure) dependence 03/25/2018   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  Erectile dysfunction 06/20/2017   Stenosis of right carotid artery without cerebral infarction 03/06/2017   Peripheral neuropathy 06/19/2016   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   Hearing loss 02/02/2016   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   Allergic rhinitis 02/02/2016   Atypical chest pain 11/24/2015   Preventative health care 11/24/2015   Onychomycosis 06/30/2015   Degenerative disc disease, lumbar 06/30/2015   Other allergic rhinitis 02/18/2015   Deviated nasal septum 02/18/2015   RLS (restless legs syndrome) 02/18/2015   GERD (gastroesophageal reflux disease) 09/18/2014   Hyperglycemia 03/03/2014   Rotator cuff tear 07/27/2013   Sleep apnea with use of continuous positive airway pressure (CPAP) 07/15/2013   Carotid stenosis 02/24/2013   Nonspecific abnormal electrocardiogram (ECG) (EKG) 01/06/2013   DJD (degenerative joint disease) of hip 12/24/2012   Lower GI bleed 12/12/2012   Right groin pain 11/29/2012   OSA (obstructive sleep apnea) 11/25/2012   Ventral hernia 07/08/2012   High risk medication use 01/18/2012   Dyspnea 06/12/2011   Hyperlipidemia 12/27/2010   Osteoarthrosis, unspecified whether generalized or localized, pelvic region and thigh 07/25/2010   Osteoarthrosis, hand 06/13/2010   Anemia  12/17/2009   Hypothyroidism 05/13/2009   GOUT 05/13/2009   Benign essential hypertension 05/13/2009   Right inguinal hernia 05/13/2009   HIATAL HERNIA 05/13/2009   NEPHROLITHIASIS 05/13/2009   ROSACEA 05/13/2009    PCP: [name], NP   REFERRING PROVIDER: Susa Raring., MD   REFERRING DIAG:  M25.562, G89.29 (ICD-10-CM) - Chronic pain in left knee  M25.561, G89.29 (ICD-10-CM) - Chronic pain in right knee   THERAPY DIAG:  Chronic pain of both knees  Muscle weakness (generalized)  Unsteadiness on feet  Other abnormalities of gait and mobility  Repeated falls  Other low back pain  RATIONALE FOR EVALUATION AND TREATMENT: Rehabilitation  ONSET DATE: off and on for a couple years  NEXT MD VISIT: none scheduled   SUBJECTIVE:                                                                                                                                                                                                         SUBJECTIVE STATEMENT: Pt reports his knees/kneecaps sometimes feel like they will "pop" out while he is walking which makes him feel like he might fall but has not yet fallen due to this. He also still feels like he is weak from his recent back surgery. He underwent lumbar sublaminar decompression in April but still has intermittent LBP and R>L buttock pain down into hip and groin area.  He denies any recent falls but had several falls during the latter part of his last PT episode less than 6 months ago.  PAIN: Are you having pain? Yes: NPRS scale: 0/10 currently, up to 5/10 Pain location: B anterior knee pain with subluxation Pain description: brief sharp, aching Aggravating factors: patellar subluxation, prolonged standing Relieving factors: rubbing his knees with Icy/Hot  Are you having pain? Yes: NPRS scale: 0/10 currently, up to 9/10 Pain location: low back and B hips/buttocks into groin Pain description: aching, pressure Aggravating  factors: getting out of bed Relieving factors: nothing   PERTINENT HISTORY:  L2-3, L3-4 Sublaminar Decompression on 12/27/22; Revision of femoral component of L THA on 07/02/22 s/p fall with periprosthetic fracture; Aortic atherosclerosis; carotid stenosis; cervical myelopathy s/p ACDF 06/2020 and posterior cervical fusion 11/2020; peripheral neuropathy; lumbar DDD/spondylosis; lumbago with sciatica/lumbar radiculopathy; OA; B TKA; B THA; L greater trochanteric pain syndrome; L RCR 2014; CTR; hearing loss; HTN; BPPV/vertigo; B sacroiliitis; skin cancer; COPD/emphysema; sleep apnea with CPAP; GERD; gout; HLD; LE cramps; RLS   PRECAUTIONS: Fall  WEIGHT BEARING RESTRICTIONS: No  FALLS:  Has patient fallen in last 6 months? Yes. 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  No fracture or bony lesion or other findings.  Impression: Right knee replacement in good condition.   01/31/23 - L knee x-ray: No fracture or bony lesion or arthritic change or other abnormality.  Left  knee replacement looks well-fixed and in excellent position.   Impression: Knee replacement in good condition.   PATIENT SURVEYS:  LEFS 31 / 80 = 38.8 %  COGNITION: Overall cognitive status: Within functional limits for tasks assessed and History of cognitive impairments - at baseline    SENSATION: Intermittent peripheral neuropathy in B feet  MUSCLE LENGTH: TBA Hamstrings:  ITB:  Piriformis:  Hip flexors:  Quads:  Heelcord:   POSTURE:  rounded shoulders, forward head, and flexed trunk   LOWER EXTREMITY ROM: Limited B hip and ankle ROM Active ROM Right eval Left eval  Knee flexion 104 96  Knee extension -10 -5   Passive ROM Right eval Left eval  Knee flexion    Knee extension 0 0  (Blank rows = not tested)  LOWER EXTREMITY MMT:  MMT Right Eval* Left Eval*  Hip flexion 3+ 4  Hip extension 3- 3+  Hip abduction 4- 4  Hip adduction 4 4  Hip internal rotation 3+ 4-  Hip external rotation 2+ 3-  Knee flexion 4 4  Knee extension 4 4+  Ankle dorsiflexion 4+ 4  Ankle plantarflexion    Ankle  inversion    Ankle eversion     (Blank rows = not tested, *tested in sitting)  FUNCTIONAL TESTS: (Remaining tests to be assessed next visit with PT) 5 times sit to stand:   Timed up and go (TUG):   10 meter walk test: 21.13 sec with RW; Gait speed = 1.55 ft/sec  Berg Balance Scale:   Dynamic Gait Index:    GAIT: Distance walked: 60 ft Assistive device utilized: Environmental consultant - 2 wheeled Level of assistance: SBA Gait pattern: step through pattern, decreased stride length, decreased hip/knee flexion- Right, decreased hip/knee flexion- Left, decreased ankle dorsiflexion- Right, decreased ankle dorsiflexion- Left, trunk flexed, poor foot clearance- Right, and poor foot clearance- Left Comments: Cues necessary for improved upright posture and rolling walker proximity   TODAY'S TREATMENT:   03/13/23 Eval only   PATIENT EDUCATION:  Education details: PT eval findings, anticipated POC, need for further assessment of balance, and gait safety  with rolling walker   Person educated: Patient Education method: Explanation Education comprehension: verbalized understanding and needs further education  HOME EXERCISE PROGRAM: TBD   ASSESSMENT:  CLINICAL IMPRESSION: RASHAAN LOVEN is a 82 y.o. male who was seen today for physical therapy evaluation and treatment for LE strengthening for B knee pain.  He is well-known to this therapist having been seen earlier this year s/p revision of left THA resulting from a fall with periprosthetic hip fracture in October 2023, as well as prior episodes for low back pain, knee pain/stiffness and gait instability/falls.  He reports his knees at times feel like they will buckle due to what sounds like patellar subluxation.  Current deficits include intermittent B knee pain, LBP with intermittent radiculopathy, decreased bilateral hip and knee ROM, impaired flexibility/abnormal muscle tension, core and lower extremity weakness, and impaired mobility and gait with increased fall risk.  Further balance testing indicated which will be completed on next visit with PT.  Gelene Mink "[name]" will benefit from skilled PT to address above deficits to improve mobility and activity tolerance with decreased pain interference.   OBJECTIVE IMPAIRMENTS: Abnormal gait, decreased activity tolerance, decreased balance, decreased coordination, decreased endurance, decreased knowledge of condition, decreased knowledge of use of DME, decreased mobility, difficulty walking, decreased ROM, decreased strength, decreased safety awareness, increased fascial restrictions, impaired perceived functional ability, increased muscle spasms, impaired flexibility, impaired sensation, improper body mechanics, postural dysfunction, and pain.   ACTIVITY LIMITATIONS: carrying, lifting, bending, sitting, standing, squatting, sleeping, stairs, transfers, bed mobility, bathing, locomotion level, and caring for others  PARTICIPATION LIMITATIONS: meal  prep, cleaning, laundry, driving, shopping, and community activity  PERSONAL FACTORS: Age, Behavior pattern, Fitness, Past/current experiences, Time since onset of injury/illness/exacerbation, and 3+ comorbidities: L2-3, L3-4 Sublaminar Decompression on 12/27/22; Revision of femoral component of L THA on 07/02/22 s/p fall with periprosthetic fracture; Aortic atherosclerosis; carotid stenosis; 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   REHAB POTENTIAL: Good  CLINICAL DECISION MAKING: Evolving/moderate complexity  EVALUATION COMPLEXITY: Moderate   GOALS: Goals reviewed with patient? Yes  SHORT TERM GOALS: Target date: 04/10/2023  Patient will be independent with initial HEP. Baseline:  Goal status: INITIAL  2.  Complete balance testing and establish additional STG's/LTG's as appropriate. Baseline:  Goal status: INITIAL  LONG TERM GOALS: Target date: 05/08/2023  Patient will be independent with advanced/ongoing HEP to improve outcomes and carryover.  Baseline:  Goal status: INITIAL  2.  Patient will report at least 50-75% improvement in B knee pain with no further instances of subluxation to improve QOL. Baseline: 5/10 Goal status: INITIAL  3.  Patient will demonstrate improved B knee extension AROM to [facility] to allow for improved stability for normal gait and stair mechanics. Baseline: Refer to above LE ROM table Goal status: INITIAL  4.  Patient will demonstrate improved B LE strength to >/= 4 to 4+/5 for improved stability and ease of mobility. Baseline: Refer to above LE MMT table Goal status: INITIAL  5.   Patient will be able to ambulate 400' with LRAD and normal gait pattern without increased pain to access community.  Baseline:  Goal status: INITIAL  6. Patient will be able to ascend/descend stairs with 1 HR and reciprocal step pattern safely to access home and community.  Baseline:  Goal status: INITIAL  7.  Patient will report >/= 40/80 on LEFS to demonstrate improved functional ability. Baseline: 31 / 80 = 38.8 % Goal status: INITIAL  8.  Patient will demonstrate at least 19/24 on DGI to decrease risk of falls. Baseline: TBD Goal status: INITIAL   9.  Patient will improve Berg score by at least 8 points to improve safety and stability with ADLs in standing and reduce risk for falls. Baseline: TBD Goal status: INITIAL   PLAN:  PT FREQUENCY: 2x/week  PT DURATION: 8 weeks  PLANNED INTERVENTIONS: Therapeutic exercises, Therapeutic activity, Neuromuscular re-education, Balance training, Gait training, Patient/Family education, Self Care, Joint mobilization, Stair training, DME instructions, Aquatic Therapy, Dry Needling, Electrical stimulation, Spinal mobilization, Cryotherapy, Moist heat, Taping, Ultrasound, and Manual therapy  PLAN FOR NEXT SESSION: create initial HEP - L knee ROM, LE flexibility and strengthening; next visit with PT - complete balance testing with 5xSTS, TUG, Berg and DGI; assess LE flexibility  Marry Guan, PT 03/13/2023, 5:04 PM

## 2023-03-09 ENCOUNTER — Other Ambulatory Visit (HOSPITAL_BASED_OUTPATIENT_CLINIC_OR_DEPARTMENT_OTHER): Payer: Self-pay | Admitting: Student

## 2023-03-09 DIAGNOSIS — M5416 Radiculopathy, lumbar region: Secondary | ICD-10-CM

## 2023-03-11 ENCOUNTER — Ambulatory Visit (HOSPITAL_BASED_OUTPATIENT_CLINIC_OR_DEPARTMENT_OTHER): Payer: Medicare HMO

## 2023-03-13 ENCOUNTER — Ambulatory Visit: Payer: Self-pay | Admitting: Licensed Clinical Social Worker

## 2023-03-13 ENCOUNTER — Ambulatory Visit: Payer: Medicare HMO | Attending: Sports Medicine | Admitting: Physical Therapy

## 2023-03-13 ENCOUNTER — Encounter: Payer: Self-pay | Admitting: Physical Therapy

## 2023-03-13 ENCOUNTER — Other Ambulatory Visit: Payer: Self-pay

## 2023-03-13 DIAGNOSIS — M25562 Pain in left knee: Secondary | ICD-10-CM | POA: Diagnosis not present

## 2023-03-13 DIAGNOSIS — M6281 Muscle weakness (generalized): Secondary | ICD-10-CM

## 2023-03-13 DIAGNOSIS — R2681 Unsteadiness on feet: Secondary | ICD-10-CM

## 2023-03-13 DIAGNOSIS — G8929 Other chronic pain: Secondary | ICD-10-CM

## 2023-03-13 DIAGNOSIS — M5416 Radiculopathy, lumbar region: Secondary | ICD-10-CM | POA: Diagnosis not present

## 2023-03-13 DIAGNOSIS — R296 Repeated falls: Secondary | ICD-10-CM | POA: Diagnosis not present

## 2023-03-13 DIAGNOSIS — M5459 Other low back pain: Secondary | ICD-10-CM | POA: Diagnosis not present

## 2023-03-13 DIAGNOSIS — R2689 Other abnormalities of gait and mobility: Secondary | ICD-10-CM | POA: Diagnosis not present

## 2023-03-13 DIAGNOSIS — M25561 Pain in right knee: Secondary | ICD-10-CM | POA: Diagnosis not present

## 2023-03-13 NOTE — Patient Instructions (Signed)
Visit Information  Thank you for taking time to visit with me today. Please don't hesitate to contact me if I can be of assistance to you.   Following are the goals we discussed today:   Goals Addressed             This Visit's Progress    Patient is recovering from back surgery, He uses a cane or a walker as needed to help him walk       Interventions:  Spoke with client via phone today about client needs Discussed client ambulation. He uses a cane or a walker as needed to help him walk Discussed medication procurement. He said he is receiving medications as needed Discussed mood of client. He feels that his mood is stable. Client likes to watch TV. Client likes to go out to eat occasionally. Client likes to spend time on computer. Client did not mention any mood issues Discussed family support with wife. Reviewed program support for client with RN, Pharmacist and LCSW Discussed hand movements. Discussed utensil use. Discussed ability to hold glass of liquid. Client said he has numbness occasionally in his hands. Client spoke of challenges in holding items with his hands Provided counseling support Discussed vision of client. He uses reading glasses Discussed client appointments. He has numerous medical appointments. Client said he has appointment today with physical therapist.  Encouraged client to call LCSW as needed at [phone].           LCSW to call client as scheduled to assess client needs   Please call the care guide team at [phone] if you need to cancel or reschedule your appointment.   If you are experiencing a Mental Health or Behavioral Health Crisis or need someone to talk to, please go to [facility] [address], [place] [phone])   The patient verbalized understanding of instructions, educational materials, and care plan provided today and DECLINED offer to receive copy of patient instructions,  educational materials, and care plan.   The patient has been provided with contact information for the care management team and has been advised to call with any health related questions or concerns.   Kelton Pillar.Daelan Gatt MSW, LCSW Licensed Visual merchandiser [facility] Care Management [phone]

## 2023-03-13 NOTE — Patient Outreach (Signed)
  Care Coordination   Follow Up Visit Note   03/13/2023 Name: [name] Pratt MRN: [id number] DOB: 11-26-1940  [name] Pratt is a 82 y.o. year old male who sees [name] Craze, NP for primary care. I spoke with  [name] Pratt by phone today.  What matters to the patients health and wellness today?  Client uses a cane or walker as needed to help him walk   Interventions:  Spoke with client via phone today about client needs Discussed client ambulation. He uses a cane or a walker as needed to help him walk Discussed medication procurement. He said he is receiving medications as needed Discussed mood of client. He feels that his mood is stable. Client likes to watch TV. Client likes to go out to eat occasionally. Client likes to spend time on computer. Client did not mention any mood issues Discussed family support with wife. Reviewed program support for client with RN, Pharmacist and LCSW Discussed hand movements. Discussed utensil use. Discussed ability to hold glass of liquid. Client said he has numbness occasionally in his hands. Client spoke of challenges in holding items with his hands Provided counseling support Discussed vision of client. He uses reading glasses Discussed client appointments. He has numerous medical appointments. Client said he has appointment today with physical therapist.  Encouraged client to call LCSW as needed at [phone].  SDOH assessments and interventions completed:  Yes  SDOH Interventions Today    Flowsheet Row Most Recent Value  SDOH Interventions   Physical Activity Interventions Other (Comments)  [client uses a cane or a walker as needed to help him walk]  Stress Interventions Other (Comment)  [has stress related to managing medical needs]        Care Coordination Interventions:  Yes, provided   Interventions Today    Flowsheet Row Most Recent Value  Chronic Disease   Chronic disease during today's visit Other  [spoke  with client about client needs]  General Interventions   General Interventions Discussed/Reviewed General Interventions Discussed, [employer]  [discussed program support]  Exercise Interventions   Exercise Discussed/Reviewed Physical Activity  [uses a cane or walker as needed to help him walk]  Physical Activity Discussed/Reviewed Physical Activity Discussed  Education Interventions   Education Provided Provided Education  Provided Verbal Education On [employer] said he has an appointment today with physical therapist]  Mental Health Interventions   Mental Health Discussed/Reviewed Anxiety, Coping Strategies  [client mood is stable. He likes to go out to eat with friends. Enjoys TV.]  Nutrition Interventions   Nutrition Discussed/Reviewed Nutrition Discussed  Pharmacy Interventions   Pharmacy Dicussed/Reviewed Pharmacy Topics Discussed  Safety Interventions   Safety Discussed/Reviewed Fall Risk        Follow up plan: LCSW to call client as scheduled to assess client needs   Encounter Outcome:  Pt. Visit Completed   Kelton Pillar.Kaye Mitro MSW, LCSW Licensed Visual merchandiser [facility] Care Management [phone]

## 2023-03-18 ENCOUNTER — Ambulatory Visit (HOSPITAL_BASED_OUTPATIENT_CLINIC_OR_DEPARTMENT_OTHER)
Admission: RE | Admit: 2023-03-18 | Discharge: 2023-03-18 | Disposition: A | Discharge status: Home / Self Care | Payer: Medicare HMO | Source: Ambulatory Visit | Attending: Student | Admitting: Student

## 2023-03-18 DIAGNOSIS — M5416 Radiculopathy, lumbar region: Secondary | ICD-10-CM | POA: Insufficient documentation

## 2023-03-18 DIAGNOSIS — M5137 Other intervertebral disc degeneration, lumbosacral region: Secondary | ICD-10-CM | POA: Diagnosis not present

## 2023-03-18 DIAGNOSIS — M5136 Other intervertebral disc degeneration, lumbar region: Secondary | ICD-10-CM | POA: Diagnosis not present

## 2023-03-18 DIAGNOSIS — M5126 Other intervertebral disc displacement, lumbar region: Secondary | ICD-10-CM | POA: Diagnosis not present

## 2023-03-18 DIAGNOSIS — M47816 Spondylosis without myelopathy or radiculopathy, lumbar region: Secondary | ICD-10-CM | POA: Diagnosis not present

## 2023-03-18 MED ORDER — GADOBUTROL 1 MMOL/ML IV SOLN
7.0000 mL | Freq: Once | INTRAVENOUS | Status: AC | PRN
  Administered 2023-03-18: 7 mL via INTRAVENOUS

## 2023-03-19 ENCOUNTER — Ambulatory Visit: Payer: Medicare HMO

## 2023-03-19 DIAGNOSIS — M5459 Other low back pain: Secondary | ICD-10-CM | POA: Diagnosis not present

## 2023-03-19 DIAGNOSIS — M25561 Pain in right knee: Secondary | ICD-10-CM | POA: Diagnosis not present

## 2023-03-19 DIAGNOSIS — M25562 Pain in left knee: Secondary | ICD-10-CM | POA: Diagnosis not present

## 2023-03-19 DIAGNOSIS — R296 Repeated falls: Secondary | ICD-10-CM

## 2023-03-19 DIAGNOSIS — M6281 Muscle weakness (generalized): Secondary | ICD-10-CM | POA: Diagnosis not present

## 2023-03-19 DIAGNOSIS — G8929 Other chronic pain: Secondary | ICD-10-CM

## 2023-03-19 DIAGNOSIS — R2681 Unsteadiness on feet: Secondary | ICD-10-CM | POA: Diagnosis not present

## 2023-03-19 DIAGNOSIS — R2689 Other abnormalities of gait and mobility: Secondary | ICD-10-CM

## 2023-03-19 NOTE — Therapy (Addendum)
OUTPATIENT PHYSICAL THERAPY TREATMENT   Patient Name: [name] Pratt MRN: [id number] DOB:05/24/1941, 82 y.o., male Today's Date: 03/19/2023   END OF SESSION:  PT End of Session - 03/19/23 1322     Visit Number 2    Date for PT Re-Evaluation 05/08/23    Authorization Type [name]    PT Start Time 1316    PT Stop Time 1402    PT Time Calculation (min) 46 min    Activity Tolerance Patient tolerated treatment well    Behavior During Therapy [facility] for tasks assessed/performed              Past Medical History:  Diagnosis Date   Allergic rhinitis 02/02/2016   Anemia 12/17/2009   Formatting of this note might be different from the original. 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 Hypertension   Benign paroxysmal positional vertigo 09/14/2021   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HCC) 07/22/2021   Body mass index (BMI) 25.0-25.9, adult 11/02/2020   Cancer (HCC)    skin cancer   Cardiac murmur 07/25/2021   Carotid stenosis    a. Carotid U/S 5/13: LICA < 50%, RICA 50-69%;  b.  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   Cystoid macular edema of both eyes 07/28/2020   Degenerative disc disease, lumbar 06/30/2015   Deviated nasal septum 02/18/2015   Diverticulosis 03/03/2020   DJD (degenerative joint disease) of hip 12/24/2012   Dyspnea 06/12/2011    Emphysema, unspecified (HCC) 03/05/2020   Erectile dysfunction 06/20/2017   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   Gait disorder 03/02/2021   GERD (gastroesophageal reflux disease)    GOUT 05/13/2009   Qualifier: Diagnosis of  By: Kem Parkinson     Greater trochanteric pain syndrome 07/22/2021   Hand pain, left 01/16/2022   Hearing loss 02/02/2016   Heme positive stool 02/29/2020   HIATAL HERNIA 05/13/2009   Qualifier: Diagnosis of  By: Kem Parkinson     High risk medication use 01/18/2012   Hip pain 03/02/2021   History of chicken pox    History of COVID-19 12/05/2021   History of hemorrhoids    History of kidney stones    History of skin cancer 07/22/2021   skin cancer   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 Cardiology - Dr Estrella Myrtle at [facility] cardiology  ICD-10 cut over   Hypertension    under control   Hypokalemia 07/08/2021   Hypothyroidism    Internal hemorrhoids 03/03/2020   Leg cramps 11/13/2019   Lower GI bleed 12/12/2012   Lumbago of lumbar region with sciatica 10/21/2020   Lumbar radiculopathy 03/02/2021   Lumbar spondylosis 11/02/2020   NEPHROLITHIASIS  05/13/2009   Qualifier: Diagnosis of  By: Kem Parkinson     Nonspecific abnormal electrocardiogram (ECG) (EKG) 01/06/2013   Numbness of hand 01/24/2022   Onychomycosis 06/30/2015   Osteoarthrosis, 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 Osteoarthritis Of The Hand  10/1 IMO update   Osteoarthrosis, unspecified whether generalized or localized, pelvic region and thigh 07/25/2010   Formatting of this note might be different from the original. Osteoarthritis Of The Hip   Other abnormal glucose 07/22/2021   Other allergic  rhinitis 02/18/2015   Palpitations 07/25/2021   Peripheral neuropathy 06/19/2016   Preventative health care 11/24/2015   Right groin pain 11/29/2012   Right inguinal hernia 05/13/2009   Qualifier: Diagnosis of  By: Kem Parkinson     RLS (restless legs syndrome) 02/18/2015   Rosacea    Rotator cuff tear 07/27/2013   Situational depression 03/02/2021   Skin lesion 01/16/2022   Sleep apnea with use of continuous positive airway pressure (CPAP) 07/15/2013   CPAP set to  7 cm water,  Residual AHi was 7.8  With no leak.  User time 6 hours.  479 days , not used only 12 days - highly compliant 06-23-13  .    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 Fawn Kirk   Ulnar neuropathy 01/24/2022   Urinary retention 11/26/2020   Ventral hernia 07/08/2012   Vertigo 07/08/2021   Weight loss 03/03/2020   Past Surgical History:  Procedure Laterality Date   ABDOMINAL HERNIA REPAIR  07/15/12   Dr Ashley Jacobs   ANTERIOR CERVICAL DECOMP/DISCECTOMY FUSION N/A 07/09/2020   Procedure: ANTERIOR CERVICAL DECOMPRESSION AND FUSION CERVICAL THREE-FOUR.;  Surgeon: Donalee Citrin, MD;  Location: [facility] OR;  Service: Neurosurgery;  Laterality: N/A;  anterior   BROW LIFT  05/07/01   COLONOSCOPY WITH PROPOFOL N/A 04/24/2022   Procedure: COLONOSCOPY WITH PROPOFOL;  Surgeon: Tressia Danas, MD;  Location: [facility] ENDOSCOPY;  Service: Gastroenterology;  Laterality: N/A;   COLONOSCOPY WITH PROPOFOL N/A 04/26/2022   Procedure: COLONOSCOPY WITH PROPOFOL;  Surgeon: Tressia Danas, MD;  Location: [facility] ENDOSCOPY;  Service: Gastroenterology;  Laterality: N/A;   CYSTOSCOPY WITH URETEROSCOPY AND STENT PLACEMENT Right 01/28/2014   Procedure: CYSTOSCOPY WITH  URETEROSCOPY, BASKET RETRIVAL AND  STENT PLACEMENT;  Surgeon: Valetta Fuller, MD;  Location: [facility] ORS;  Service: Urology;  Laterality: Right;   epidural injections     multiple procedures   ESOPHAGEAL DILATION  1993 and 1994   multiple times   EYE SURGERY Bilateral 03/25/10, 2012   cataract removal   HEMORRHOID SURGERY  2014   HIATAL HERNIA REPAIR  12/21/93   HOLMIUM LASER APPLICATION Right 01/28/2014   Procedure: HOLMIUM LASER APPLICATION;  Surgeon: Valetta Fuller, MD;  Location: [facility] ORS;  Service: Urology;  Laterality: Right;   INGUINAL HERNIA REPAIR Right 07/15/12   Dr Ashley Jacobs, x2   JOINT REPLACEMENT  07/25/04   right knee   JOINT REPLACEMENT  07/13/10   left knee   KNEE SURGERY  1995   LAPAROSCOPIC CHOLECYSTECTOMY  09/20/06   with intraoperative cholangiogram and right inguinal herniorrhaphy with mesh    LIGAMENT REPAIR Left 07/2013   shoulder   LITHOTRIPSY     x2  LUMBAR LAMINECTOMY/DECOMPRESSION MICRODISCECTOMY Bilateral 12/27/2022   Procedure: Lumbar Two-Three, Lumbar Three-Four Sublaminar Decompression;  Surgeon: Donalee Citrin, MD;  Location: [facility] OR;  Service: Neurosurgery;  Laterality: Bilateral;   NASAL SEPTUM SURGERY  1975   POLYPECTOMY  04/26/2022   Procedure: POLYPECTOMY;  Surgeon: Tressia Danas, MD;  Location: [facility] ENDOSCOPY;  Service: Gastroenterology;;   POSTERIOR CERVICAL FUSION/FORAMINOTOMY N/A 11/24/2020   Procedure: Posterior Cervical Laminectomy Cervical three-four, Cervical four-five with lateral mass fusion;  Surgeon: Donalee Citrin, MD;  Location: [facility] OR;  Service: Neurosurgery;  Laterality: N/A;   REFRACTIVE SURGERY Bilateral    Right total hip replacement  4/14   SKIN CANCER DESTRUCTION     nose, ear   TONSILLECTOMY  as child   TOTAL HIP ARTHROPLASTY Left    URETHRAL DILATION  1991   Dr. Annabell Howells   Patient Active Problem List   Diagnosis Date Noted   Spinal stenosis of lumbar region 12/27/2022   Drowsiness 12/07/2022   Preop examination 12/06/2022   Weakness of lower  extremity 10/10/2022   Skin tear of right elbow without complication 09/18/2022   Skin tear of left lower leg without complication 09/18/2022   Fall 09/18/2022   Iron deficiency anemia 09/18/2022   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   Vitamin D deficiency 08/07/2022   Closed fracture of left hip (HCC) 08/07/2022   Hand weakness 05/02/2022   Acute kidney injury (HCC) 05/02/2022   Adenomatous polyp of ascending colon    Adenomatous polyp of colon    Acute metabolic encephalopathy 04/24/2022   GI bleed 04/21/2022   Vasovagal syncope    Hemorrhagic shock (HCC)    Hematochezia    Irregular heart beat 02/03/2022   Constipation 02/03/2022   Carpal tunnel syndrome 01/24/2022   Numbness of hand 01/24/2022   Ulnar neuropathy 01/24/2022   Skin lesion 01/16/2022   History of COVID-19 12/05/2021   Benign paroxysmal positional vertigo 09/14/2021   Palpitations 07/25/2021   Cardiac murmur 07/25/2021   History of chicken pox 07/22/2021   History of hemorrhoids 07/22/2021   Hypertension 07/22/2021   COPD (chronic obstructive pulmonary disease) (HCC) 07/22/2021   History of kidney stones 07/22/2021   History of skin cancer 07/22/2021   Complication of anesthesia 07/22/2021   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HCC) 07/22/2021   Other abnormal glucose 07/22/2021   Spondylitis (HCC) 07/22/2021   Greater trochanteric pain syndrome 07/22/2021   Vertigo 07/08/2021   Hypokalemia 07/08/2021   Trochanteric bursitis of left hip 06/30/2021   Cancer (HCC) 04/11/2021   Lumbar radiculopathy 03/02/2021   Gait instability 03/02/2021   Hip pain 03/02/2021   Situational depression 03/02/2021   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   Urinary retention 11/26/2020   Cervical myelopathy (HCC) 11/24/2020   Body mass index (BMI) 25.0-25.9, adult 11/02/2020   Lumbar spondylosis 11/02/2020   Lumbago of lumbar region with sciatica 10/21/2020    Cystoid macular edema of both eyes 07/28/2020   Status post cervical spinal fusion 07/09/2020   History of total bilateral knee replacement 07/08/2020   Aortic atherosclerosis (HCC) 03/05/2020   Emphysema, unspecified (HCC) 03/05/2020   Arthritis 03/04/2020   Diverticulosis 03/03/2020   Internal hemorrhoids 03/03/2020   Weight loss 03/03/2020   Heme positive stool 02/29/2020   Leg cramps 11/13/2019   Tibialis anterior tendon tear, nontraumatic 11/13/2019   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 07/29/2018   CPAP (continuous positive airway pressure) dependence 03/25/2018   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  Erectile dysfunction 06/20/2017   Stenosis of right carotid artery without cerebral infarction 03/06/2017   Peripheral neuropathy 06/19/2016   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   Hearing loss 02/02/2016   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   Allergic rhinitis 02/02/2016   Atypical chest pain 11/24/2015   Preventative health care 11/24/2015   Onychomycosis 06/30/2015   Degenerative disc disease, lumbar 06/30/2015   Other allergic rhinitis 02/18/2015   Deviated nasal septum 02/18/2015   RLS (restless legs syndrome) 02/18/2015   GERD (gastroesophageal reflux disease) 09/18/2014   Hyperglycemia 03/03/2014   Rotator cuff tear 07/27/2013   Sleep apnea with use of continuous positive airway pressure (CPAP) 07/15/2013   Carotid stenosis 02/24/2013   Nonspecific abnormal electrocardiogram (ECG) (EKG) 01/06/2013   DJD (degenerative joint disease) of hip 12/24/2012   Lower GI bleed 12/12/2012   Right groin pain 11/29/2012   OSA (obstructive sleep apnea) 11/25/2012   Ventral hernia 07/08/2012   High risk medication use 01/18/2012   Dyspnea 06/12/2011   Hyperlipidemia 12/27/2010   Osteoarthrosis, unspecified whether generalized or localized, pelvic region and thigh 07/25/2010   Osteoarthrosis, hand 06/13/2010   Anemia 12/17/2009   Hypothyroidism  05/13/2009   GOUT 05/13/2009   Benign essential hypertension 05/13/2009   Right inguinal hernia 05/13/2009   HIATAL HERNIA 05/13/2009   NEPHROLITHIASIS 05/13/2009   ROSACEA 05/13/2009    PCP: [name], NP   REFERRING PROVIDER: Susa Raring., MD   REFERRING DIAG:  M25.562, G89.29 (ICD-10-CM) - Chronic pain in left knee  M25.561, G89.29 (ICD-10-CM) - Chronic pain in right knee   THERAPY DIAG:  Chronic pain of both knees  Muscle weakness (generalized)  Unsteadiness on feet  Other abnormalities of gait and mobility  Repeated falls  Other low back pain  RATIONALE FOR EVALUATION AND TREATMENT: Rehabilitation  ONSET DATE: off and on for a couple years  NEXT MD VISIT: none scheduled   SUBJECTIVE:                                                                                                                                                                                                         SUBJECTIVE STATEMENT: Did a lot of walking earlier, having some low back pain today and knee pain.  PAIN: Are you having pain? Yes: NPRS scale: 0/10 currently, up to 5/10 Pain location: B anterior knee pain with subluxation Pain description: brief sharp, aching Aggravating factors: patellar subluxation, prolonged standing Relieving factors: rubbing his knees with Icy/Hot  Are you having pain? Yes: NPRS scale: 0/10 Pain location: low  back and B hips/buttocks into groin Pain description: aching, pressure Aggravating factors: getting out of bed Relieving factors: nothing   PERTINENT HISTORY:  L2-3, L3-4 Sublaminar Decompression on 12/27/22; Revision of femoral component of L THA on 07/02/22 s/p fall with periprosthetic fracture; Aortic atherosclerosis; carotid stenosis; cervical myelopathy s/p ACDF 06/2020 and posterior cervical fusion 11/2020; peripheral neuropathy; lumbar DDD/spondylosis; lumbago with sciatica/lumbar radiculopathy; OA; B TKA; B THA; L greater  trochanteric pain syndrome; L RCR 2014; CTR; hearing loss; HTN; BPPV/vertigo; B sacroiliitis; skin cancer; COPD/emphysema; sleep apnea with CPAP; GERD; gout; HLD; LE cramps; RLS   PRECAUTIONS: Fall  WEIGHT BEARING RESTRICTIONS: No  FALLS:  Has patient fallen in last 6 months? Yes. 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  No fracture or bony lesion or other findings.  Impression: Right knee replacement in good condition.   01/31/23 - L knee x-ray: No fracture or bony lesion or arthritic change or other abnormality.  Left  knee replacement looks well-fixed and in excellent position.  Impression: Knee replacement in good condition.   PATIENT SURVEYS:  LEFS 31 / 80 = 38.8 %  COGNITION: Overall cognitive status: Within functional limits for tasks assessed and History of cognitive impairments - at baseline    SENSATION: Intermittent peripheral neuropathy in B feet  MUSCLE LENGTH: TBA Hamstrings:  ITB:  Piriformis:  Hip flexors:  Quads:  Heelcord:   POSTURE:  rounded shoulders, forward head, and flexed trunk    LOWER EXTREMITY ROM: Limited B hip and ankle ROM Active ROM Right eval Left eval  Knee flexion 104 96  Knee extension -10 -5   Passive ROM Right eval Left eval  Knee flexion    Knee extension 0 0  (Blank rows = not tested)  LOWER EXTREMITY MMT:  MMT Right Eval* Left Eval*  Hip flexion 3+ 4  Hip extension 3- 3+  Hip abduction 4- 4  Hip adduction 4 4  Hip internal rotation 3+ 4-  Hip external rotation 2+ 3-  Knee flexion 4 4  Knee extension 4 4+  Ankle dorsiflexion 4+ 4  Ankle plantarflexion    Ankle inversion    Ankle eversion     (Blank rows = not tested, *tested in sitting)  FUNCTIONAL TESTS: (Remaining tests to be assessed next visit with PT) 5 times sit to stand:   Timed up and go (TUG):   10 meter walk test: 21.13 sec with RW; Gait speed = 1.55 ft/sec  Berg Balance Scale:   Dynamic Gait Index:    GAIT: Distance walked: 60 ft Assistive device utilized: Environmental consultant -  2 wheeled Level of assistance: SBA Gait pattern: step through pattern, decreased stride length, decreased hip/knee flexion- Right, decreased hip/knee flexion- Left, decreased ankle dorsiflexion- Right, decreased ankle dorsiflexion- Left, trunk flexed, poor foot clearance- Right, and poor foot clearance- Left Comments: Cues necessary for improved upright posture and rolling walker proximity   TODAY'S TREATMENT:  03/19/23 Therapeutic Exercise: to improve strength and mobility.  Demo, verbal and tactile cues throughout for technique.  Nustep L4x31min Seated heel slides AROM x 10 bil Seated hip ADD with ball 10x3" Seated quad sets 10x5" bil Seated hamstring stretch x 30 sec bil Gait 180 ft around clinic with walker- cues for posture and foot clearance on R Seated SLR bil x 10   03/13/23 Eval only   PATIENT EDUCATION:  Education details: PT eval findings, anticipated POC, need for further assessment of balance, and gait safety with rolling walker   Person educated: Patient Education method:  Explanation Education comprehension: verbalized understanding and needs further education  HOME EXERCISE PROGRAM: Access Code: [email or web address] URL: [email or web address] Date: 03/19/2023 Prepared by: Verta Ellen  Exercises - Seated Hamstring Stretch with Strap  - 1 x daily - 7 x weekly - 3 sets - 30 sec hold - Seated Quad Set  - 1 x daily - 7 x weekly - 3 sets - 10 reps - 5 seconds hold - Seated Hip Adduction Squeeze with Ball  - 1 x daily - 7 x weekly - 3 sets - 10 reps - 5 secs hold   ASSESSMENT:  CLINICAL IMPRESSION: [name] Pratt is a 82 y.o. male who was seen today for physical therapy treatment for LE strengthening for B knee pain. Progressed light knee strengthening exercises to tolerance. Good tolerance for exercises with no increase in pain. He needs cues when walking with his walker for upright posture and for heel strike.   Haik "[name] Pratt" will benefit from skilled PT to address above deficits to improve mobility and activity tolerance with decreased pain interference.   OBJECTIVE IMPAIRMENTS: Abnormal gait, decreased activity tolerance, decreased balance, decreased coordination, decreased endurance, decreased knowledge of condition, decreased knowledge of use of DME, decreased mobility, difficulty walking, decreased ROM, decreased strength, decreased safety awareness, increased fascial restrictions, impaired perceived functional ability, increased muscle spasms, impaired flexibility, impaired sensation, improper body mechanics, postural dysfunction, and pain.   ACTIVITY LIMITATIONS: carrying, lifting, bending, sitting, standing, squatting, sleeping, stairs, transfers, bed mobility, bathing, locomotion level, and caring for others  PARTICIPATION LIMITATIONS: meal prep, cleaning, laundry, driving, shopping, and community activity  PERSONAL FACTORS: Age, Behavior pattern, Fitness, Past/current experiences, Time since onset of injury/illness/exacerbation, and 3+  comorbidities: L2-3, L3-4 Sublaminar Decompression on 12/27/22; Revision of femoral component of L THA on 07/02/22 s/p fall with periprosthetic fracture; Aortic atherosclerosis; carotid stenosis; 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   REHAB POTENTIAL: Good  CLINICAL DECISION MAKING: Evolving/moderate complexity  EVALUATION COMPLEXITY: Moderate   GOALS: Goals reviewed with patient? Yes  SHORT TERM GOALS: Target date: 04/10/2023  Patient will be independent with initial HEP. Baseline:  Goal status: INITIAL  2.  Complete balance testing and establish additional STG's/LTG's as appropriate. Baseline:  Goal status: INITIAL  LONG TERM GOALS: Target date: 05/08/2023  Patient  will be independent with advanced/ongoing HEP to improve outcomes and carryover.  Baseline:  Goal status: INITIAL  2.  Patient will report at least 50-75% improvement in B knee pain with no further instances of subluxation to improve QOL. Baseline: 5/10 Goal status: INITIAL  3.  Patient will demonstrate improved B knee extension AROM to [facility] to allow for improved stability for normal gait and stair mechanics. Baseline: Refer to above LE ROM table Goal status: INITIAL  4.  Patient will demonstrate improved B LE strength to >/= 4 to 4+/5 for improved stability and ease of mobility. Baseline: Refer to above LE MMT table Goal status: INITIAL  5.  Patient will be able to ambulate 400' with LRAD and normal gait pattern without increased pain to access community.  Baseline:  Goal status: INITIAL  6. Patient will be able to ascend/descend  stairs with 1 HR and reciprocal step pattern safely to access home and community.  Baseline:  Goal status: INITIAL  7.  Patient will report >/= 40/80 on LEFS to demonstrate improved functional ability. Baseline: 31 / 80 = 38.8 % Goal status: INITIAL  8.  Patient will demonstrate at least 19/24 on DGI to decrease risk of falls. Baseline: TBD Goal status: INITIAL   9.  Patient will improve Berg score by at least 8 points to improve safety and stability with ADLs in standing and reduce risk for falls. Baseline: TBD Goal status: INITIAL   PLAN:  PT FREQUENCY: 2x/week  PT DURATION: 8 weeks  PLANNED INTERVENTIONS: Therapeutic exercises, Therapeutic activity, Neuromuscular re-education, Balance training, Gait training, Patient/Family education, Self Care, Joint mobilization, Stair training, DME instructions, Aquatic Therapy, Dry Needling, Electrical stimulation, Spinal mobilization, Cryotherapy, Moist heat, Taping, Ultrasound, and Manual therapy  PLAN FOR NEXT SESSION: create initial HEP - L knee ROM, LE flexibility and strengthening; next visit with PT - complete balance testing with 5xSTS, TUG, Berg and DGI; assess LE flexibility  Darleene Cleaver, PTA 03/19/2023, 3:13 PM

## 2023-03-20 DIAGNOSIS — M5416 Radiculopathy, lumbar region: Secondary | ICD-10-CM | POA: Diagnosis not present

## 2023-03-22 ENCOUNTER — Encounter: Payer: Self-pay | Admitting: Physical Therapy

## 2023-03-22 ENCOUNTER — Ambulatory Visit: Payer: Medicare HMO | Admitting: Physical Therapy

## 2023-03-22 DIAGNOSIS — M6281 Muscle weakness (generalized): Secondary | ICD-10-CM | POA: Diagnosis not present

## 2023-03-22 DIAGNOSIS — R296 Repeated falls: Secondary | ICD-10-CM | POA: Diagnosis not present

## 2023-03-22 DIAGNOSIS — G8929 Other chronic pain: Secondary | ICD-10-CM | POA: Diagnosis not present

## 2023-03-22 DIAGNOSIS — R2681 Unsteadiness on feet: Secondary | ICD-10-CM | POA: Diagnosis not present

## 2023-03-22 DIAGNOSIS — R2689 Other abnormalities of gait and mobility: Secondary | ICD-10-CM

## 2023-03-22 DIAGNOSIS — M5459 Other low back pain: Secondary | ICD-10-CM | POA: Diagnosis not present

## 2023-03-22 DIAGNOSIS — M25562 Pain in left knee: Secondary | ICD-10-CM | POA: Diagnosis not present

## 2023-03-22 DIAGNOSIS — M25561 Pain in right knee: Secondary | ICD-10-CM | POA: Diagnosis not present

## 2023-03-22 NOTE — Therapy (Signed)
OUTPATIENT PHYSICAL THERAPY TREATMENT   Patient Name: [name] Pratt MRN: [id number] DOB:02/23/1941, 82 y.o., male Today's Date: 03/22/2023   END OF SESSION:  PT End of Session - 03/22/23 1443     Visit Number 3    Date for PT Re-Evaluation 05/08/23    Authorization Type [name]    Authorization Time Period 03/13/23 - 05/08/23    Authorization - Visit Number 2    Authorization - Number of Visits 10    Progress Note Due on Visit 10    PT Start Time 1444    PT Stop Time 1532    PT Time Calculation (min) 48 min    Activity Tolerance Patient tolerated treatment well    Behavior During Therapy [facility] for tasks assessed/performed              Past Medical History:  Diagnosis Date   Allergic rhinitis 02/02/2016   Anemia 12/17/2009   Formatting of this note might be different from the original. 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 Hypertension   Benign paroxysmal positional vertigo 09/14/2021   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HCC) 07/22/2021   Body mass index (BMI) 25.0-25.9, adult 11/02/2020   Cancer (HCC)    skin cancer   Cardiac murmur 07/25/2021   Carotid stenosis    a. Carotid U/S 5/13: LICA < 50%, RICA 50-69%;  b.  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   Cystoid macular edema of both eyes 07/28/2020   Degenerative disc disease, lumbar  06/30/2015   Deviated nasal septum 02/18/2015   Diverticulosis 03/03/2020   DJD (degenerative joint disease) of hip 12/24/2012   Dyspnea 06/12/2011   Emphysema, unspecified (HCC) 03/05/2020   Erectile dysfunction 06/20/2017   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   Gait disorder 03/02/2021   GERD (gastroesophageal reflux disease)    GOUT 05/13/2009   Qualifier: Diagnosis of  By: Kem Parkinson     Greater trochanteric pain syndrome 07/22/2021   Hand pain, left 01/16/2022   Hearing loss 02/02/2016   Heme positive stool 02/29/2020   HIATAL HERNIA 05/13/2009   Qualifier: Diagnosis of  By: Kem Parkinson     High risk medication use 01/18/2012   Hip pain 03/02/2021   History of chicken pox    History of COVID-19 12/05/2021   History of hemorrhoids    History of kidney stones    History of skin cancer 07/22/2021   skin cancer   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 Cardiology - Dr Estrella Myrtle at [facility] cardiology  ICD-10 cut over   Hypertension    under control   Hypokalemia 07/08/2021   Hypothyroidism    Internal  hemorrhoids 03/03/2020   Leg cramps 11/13/2019   Lower GI bleed 12/12/2012   Lumbago of lumbar region with sciatica 10/21/2020   Lumbar radiculopathy 03/02/2021   Lumbar spondylosis 11/02/2020   NEPHROLITHIASIS 05/13/2009   Qualifier: Diagnosis of  By: Kem Parkinson     Nonspecific abnormal electrocardiogram (ECG) (EKG) 01/06/2013   Numbness of hand 01/24/2022   Onychomycosis 06/30/2015   Osteoarthrosis, 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 Osteoarthritis Of The Hand  10/1 IMO update   Osteoarthrosis, unspecified whether generalized or localized, pelvic region and thigh  07/25/2010   Formatting of this note might be different from the original. Osteoarthritis Of The Hip   Other abnormal glucose 07/22/2021   Other allergic rhinitis 02/18/2015   Palpitations 07/25/2021   Peripheral neuropathy 06/19/2016   Preventative health care 11/24/2015   Right groin pain 11/29/2012   Right inguinal hernia 05/13/2009   Qualifier: Diagnosis of  By: Kem Parkinson     RLS (restless legs syndrome) 02/18/2015   Rosacea    Rotator cuff tear 07/27/2013   Situational depression 03/02/2021   Skin lesion 01/16/2022   Sleep apnea with use of continuous positive airway pressure (CPAP) 07/15/2013   CPAP set to  7 cm water,  Residual AHi was 7.8  With no leak.  User time 6 hours.  479 days , not used only 12 days - highly compliant 06-23-13  .    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 Fawn Kirk   Ulnar neuropathy 01/24/2022   Urinary retention 11/26/2020   Ventral hernia 07/08/2012   Vertigo 07/08/2021   Weight loss 03/03/2020   Past Surgical History:  Procedure Laterality Date   ABDOMINAL HERNIA REPAIR  07/15/12   Dr Ashley Jacobs   ANTERIOR CERVICAL DECOMP/DISCECTOMY FUSION N/A 07/09/2020   Procedure: ANTERIOR CERVICAL DECOMPRESSION AND FUSION CERVICAL THREE-FOUR.;  Surgeon: Donalee Citrin, MD;  Location: [facility] OR;  Service: Neurosurgery;  Laterality: N/A;  anterior   BROW LIFT  05/07/01   COLONOSCOPY WITH PROPOFOL N/A 04/24/2022   Procedure: COLONOSCOPY WITH PROPOFOL;  Surgeon: Tressia Danas, MD;  Location: [facility] ENDOSCOPY;  Service: Gastroenterology;  Laterality: N/A;   COLONOSCOPY WITH PROPOFOL N/A 04/26/2022   Procedure: COLONOSCOPY WITH PROPOFOL;  Surgeon: Tressia Danas, MD;  Location: [facility]  ENDOSCOPY;  Service: Gastroenterology;  Laterality: N/A;   CYSTOSCOPY WITH URETEROSCOPY AND STENT PLACEMENT Right 01/28/2014   Procedure: CYSTOSCOPY WITH URETEROSCOPY, BASKET RETRIVAL AND  STENT PLACEMENT;  Surgeon: Valetta Fuller, MD;  Location: [facility] ORS;  Service: Urology;  Laterality: Right;   epidural injections     multiple procedures   ESOPHAGEAL DILATION  1993 and 1994   multiple times   EYE SURGERY Bilateral 03/25/10, 2012   cataract removal   HEMORRHOID SURGERY  2014   HIATAL HERNIA REPAIR  12/21/93   HOLMIUM LASER APPLICATION Right 01/28/2014   Procedure: HOLMIUM LASER APPLICATION;  Surgeon: Valetta Fuller, MD;  Location: [facility] ORS;  Service: Urology;  Laterality: Right;   INGUINAL HERNIA REPAIR Right 07/15/12   Dr Ashley Jacobs, x2   JOINT REPLACEMENT  07/25/04   right knee   JOINT REPLACEMENT  07/13/10   left knee   KNEE SURGERY  1995  LAPAROSCOPIC CHOLECYSTECTOMY  09/20/06   with intraoperative cholangiogram and right inguinal herniorrhaphy with mesh    LIGAMENT REPAIR Left 07/2013   shoulder   LITHOTRIPSY     x2   LUMBAR LAMINECTOMY/DECOMPRESSION MICRODISCECTOMY Bilateral 12/27/2022   Procedure: Lumbar Two-Three, Lumbar Three-Four Sublaminar Decompression;  Surgeon: Donalee Citrin, MD;  Location: [facility] OR;  Service: Neurosurgery;  Laterality: Bilateral;   NASAL SEPTUM SURGERY  1975   POLYPECTOMY  04/26/2022   Procedure: POLYPECTOMY;  Surgeon: Tressia Danas, MD;  Location[facility] ENDOSCOPY;  Service: Gastroenterology;;   POSTERIOR CERVICAL FUSION/FORAMINOTOMY N/A 11/24/2020   Procedure: Posterior Cervical Laminectomy Cervical three-four, Cervical four-five with lateral mass fusion;  Surgeon: Donalee Citrin, MD;  Location: [facility] OR;  Service: Neurosurgery;  Laterality: N/A;   REFRACTIVE SURGERY Bilateral    Right total hip replacement  4/14   SKIN CANCER DESTRUCTION     nose, ear   TONSILLECTOMY  as child   TOTAL HIP ARTHROPLASTY Left    URETHRAL DILATION  1991   Dr. Annabell Howells   Patient Active Problem  List   Diagnosis Date Noted   Spinal stenosis of lumbar region 12/27/2022   Drowsiness 12/07/2022   Preop examination 12/06/2022   Weakness of lower extremity 10/10/2022   Skin tear of right elbow without complication 09/18/2022   Skin tear of left lower leg without complication 09/18/2022   Fall 09/18/2022   Iron deficiency anemia 09/18/2022   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   Vitamin D deficiency 08/07/2022   Closed fracture of left hip (HCC) 08/07/2022   Hand weakness 05/02/2022   Acute kidney injury (HCC) 05/02/2022   Adenomatous polyp of ascending colon    Adenomatous polyp of colon    Acute metabolic encephalopathy 04/24/2022   GI bleed 04/21/2022   Vasovagal syncope    Hemorrhagic shock (HCC)    Hematochezia    Irregular heart beat 02/03/2022   Constipation 02/03/2022   Carpal tunnel syndrome 01/24/2022   Numbness of hand 01/24/2022   Ulnar neuropathy 01/24/2022   Skin lesion 01/16/2022   History of COVID-19 12/05/2021   Benign paroxysmal positional vertigo 09/14/2021   Palpitations 07/25/2021   Cardiac murmur 07/25/2021   History of chicken pox 07/22/2021   History of hemorrhoids 07/22/2021   Hypertension 07/22/2021   COPD (chronic obstructive pulmonary disease) (HCC) 07/22/2021   History of kidney stones 07/22/2021   History of skin cancer 07/22/2021   Complication of anesthesia 07/22/2021   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HCC) 07/22/2021   Other abnormal glucose 07/22/2021   Spondylitis (HCC) 07/22/2021   Greater trochanteric pain syndrome 07/22/2021   Vertigo 07/08/2021   Hypokalemia 07/08/2021   Trochanteric bursitis of left hip 06/30/2021   Cancer (HCC) 04/11/2021   Lumbar radiculopathy 03/02/2021   Gait instability 03/02/2021   Hip pain 03/02/2021   Situational depression 03/02/2021   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   Urinary retention 11/26/2020   Cervical myelopathy  (HCC) 11/24/2020   Body mass index (BMI) 25.0-25.9, adult 11/02/2020   Lumbar spondylosis 11/02/2020   Lumbago of lumbar region with sciatica 10/21/2020   Cystoid macular edema of both eyes 07/28/2020   Status post cervical spinal fusion 07/09/2020   History of total bilateral knee replacement 07/08/2020   Aortic atherosclerosis (HCC) 03/05/2020   Emphysema, unspecified (HCC) 03/05/2020   Arthritis 03/04/2020   Diverticulosis 03/03/2020   Internal hemorrhoids 03/03/2020   Weight loss 03/03/2020   Heme positive stool 02/29/2020   Leg cramps  11/13/2019   Tibialis anterior tendon tear, nontraumatic 11/13/2019   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 07/29/2018   CPAP (continuous positive airway pressure) dependence 03/25/2018   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   Erectile dysfunction 06/20/2017   Stenosis of right carotid artery without cerebral infarction 03/06/2017   Peripheral neuropathy 06/19/2016   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   Hearing loss 02/02/2016   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   Allergic rhinitis 02/02/2016   Atypical chest pain 11/24/2015   Preventative health care 11/24/2015   Onychomycosis 06/30/2015   Degenerative disc disease, lumbar 06/30/2015   Other allergic rhinitis 02/18/2015   Deviated nasal septum 02/18/2015   RLS (restless legs syndrome) 02/18/2015   GERD (gastroesophageal reflux disease) 09/18/2014   Hyperglycemia 03/03/2014   Rotator cuff tear 07/27/2013   Sleep apnea with use of continuous positive airway pressure (CPAP) 07/15/2013   Carotid stenosis 02/24/2013   Nonspecific abnormal electrocardiogram (ECG) (EKG) 01/06/2013   DJD (degenerative joint disease) of hip 12/24/2012   Lower GI bleed 12/12/2012   Right groin pain 11/29/2012   OSA (obstructive sleep apnea) 11/25/2012   Ventral hernia 07/08/2012   High risk medication use 01/18/2012   Dyspnea 06/12/2011   Hyperlipidemia 12/27/2010   Osteoarthrosis,  unspecified whether generalized or localized, pelvic region and thigh 07/25/2010   Osteoarthrosis, hand 06/13/2010   Anemia 12/17/2009   Hypothyroidism 05/13/2009   GOUT 05/13/2009   Benign essential hypertension 05/13/2009   Right inguinal hernia 05/13/2009   HIATAL HERNIA 05/13/2009   NEPHROLITHIASIS 05/13/2009   ROSACEA 05/13/2009    PCP: [name], NP   REFERRING PROVIDER: Susa Raring., MD   REFERRING DIAG:  M25.562, G89.29 (ICD-10-CM) - Chronic pain in left knee  M25.561, G89.29 (ICD-10-CM) - Chronic pain in right knee   THERAPY DIAG:  Chronic pain of both knees  Muscle weakness (generalized)  Unsteadiness on feet  Other abnormalities of gait and mobility  Repeated falls  Other low back pain  RATIONALE FOR EVALUATION AND TREATMENT: Rehabilitation  ONSET DATE: off and on for a couple years  NEXT MD VISIT: none scheduled   SUBJECTIVE:                                                                                                                                                                                                         SUBJECTIVE STATEMENT: Pt reports his sides were hurting earlier but denies pain currently. No knee pain today. He reports he is scheduled for an ESI on 04/02/23.   PAIN: Are you  having pain? Yes: NPRS scale: 0/10 Pain location: B anterior knee pain with subluxation Pain description: brief sharp, aching Aggravating factors: patellar subluxation, prolonged standing Relieving factors: rubbing his knees with Icy/Hot  Are you having pain? Yes: NPRS scale: 0/10 currently, up to 5/10 Pain location: B flank Pain description: aching, pressure Aggravating factors: getting out of bed Relieving factors: nothing   PERTINENT HISTORY:  L2-3, L3-4 Sublaminar Decompression on 12/27/22; Revision of femoral component of L THA on 07/02/22 s/p fall with periprosthetic fracture; Aortic atherosclerosis; carotid stenosis; cervical  myelopathy s/p ACDF 06/2020 and posterior cervical fusion 11/2020; peripheral neuropathy; lumbar DDD/spondylosis; lumbago with sciatica/lumbar radiculopathy; OA; B TKA; B THA; L greater trochanteric pain syndrome; L RCR 2014; CTR; hearing loss; HTN; BPPV/vertigo; B sacroiliitis; skin cancer; COPD/emphysema; sleep apnea with CPAP; GERD; gout; HLD; LE cramps; RLS   PRECAUTIONS: Fall  WEIGHT BEARING RESTRICTIONS: No  FALLS:  Has patient fallen in last 6 months? Yes. 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  No fracture or bony lesion or other findings.  Impression: Right knee replacement in good condition.   01/31/23 - L knee x-ray: No fracture or bony lesion or arthritic change or other abnormality.  Left  knee replacement looks well-fixed and in excellent position.  Impression: Knee replacement in good condition.   PATIENT SURVEYS:  LEFS 31 / 80 = 38.8 %  COGNITION: Overall cognitive status: Within functional limits for tasks assessed and History of cognitive impairments - at baseline    SENSATION: Intermittent peripheral  neuropathy in B feet  MUSCLE LENGTH: TBA Hamstrings:  ITB:  Piriformis:  Hip flexors:  Quads:  Heelcord:   POSTURE:  rounded shoulders, forward head, and flexed trunk   LOWER EXTREMITY ROM: Limited B hip and ankle ROM Active ROM Right eval Left eval  Knee flexion 104 96  Knee extension -10 -5   Passive ROM Right eval Left eval  Knee flexion    Knee extension 0 0  (Blank rows = not tested)  LOWER EXTREMITY MMT:  MMT Right Eval* Left Eval*  Hip flexion 3+ 4  Hip extension 3- 3+  Hip abduction 4- 4  Hip adduction 4 4  Hip internal rotation 3+ 4-  Hip external rotation 2+ 3-  Knee flexion 4 4  Knee extension 4 4+  Ankle dorsiflexion 4+ 4  Ankle plantarflexion    Ankle inversion    Ankle eversion     (Blank rows = not tested, *tested in sitting)  FUNCTIONAL TESTS: (Remaining tests to be assessed next visit with PT) 5 times sit to stand: 18.94  sec w/o UE assist (uncontrolled descent on most reps) (03/22/23) Timed up and go (TUG): 21.29 sec with RW (03/22/23) 10 meter walk test: 21.13 sec with RW; Gait speed = 1.55 ft/sec  Berg Balance Scale: 35/56; <36 = High risk for falls (close to 100%) (03/22/23) Dynamic Gait Index: 11/24; Scores of 19 or less are predictive of falls in older community living adults (03/22/23)  GAIT: Distance walked: 60 ft Assistive device utilized: Environmental consultant - 2 wheeled Level of assistance: SBA Gait pattern: step through pattern, decreased stride length, decreased hip/knee flexion- Right, decreased hip/knee flexion- Left, decreased ankle dorsiflexion- Right, decreased ankle dorsiflexion- Left, trunk flexed, poor foot clearance- Right, and poor foot clearance- Left Comments: Cues necessary for improved upright posture and rolling walker proximity   TODAY'S TREATMENT:   03/22/23 THERAPEUTIC EXERCISE: to improve flexibility, strength and mobility.  Demonstration, verbal and tactile cues throughout for technique.  NuStep L4 x 6 min  THERAPEUTIC  ACTIVITIES: 5xSTS: 18.94 sec w/o UE assist (uncontrolled descent on most reps) TUG: 21.29 sec with RW Berg:  35/56; <36 = High risk for falls (close to 100%)  DGI: 11/24; Scores of 19 or less are predictive of falls in older community living adults   GAIT TRAINING: To normalize gait pattern and improve safety with RW/FWW . Gait 200 feet with RW/FWW - cues for upright posture and improved walker proximity - patient demonstrating better foot clearance during swing through with good heel strike on weight acceptance but still noted to catch right toes on occasion Stairs: Level of Assistance: Min A Stair Negotiation Technique: Step to Pattern Alternating Pattern  with Single Rail on Right Number of Stairs: 14  Height of Stairs: 7"  Comments: Patient attempting stairs with reciprocal alternating pattern however having difficulty clearing toes on stair ascent and increased instability with limited eccentric control noted on stair descent - patient encouraged to use step to pattern as needed for safety if needing to navigate stairs in place of alternating pattern.   03/19/23 Therapeutic Exercise: to improve strength and mobility.  Demo, verbal and tactile cues throughout for technique.  Nustep L4x39min Seated heel slides AROM x 10 bil Seated hip ADD with ball 10x3" Seated quad sets 10x5" bil Seated hamstring stretch x 30 sec bil Gait 180 ft around clinic with walker- cues for posture and foot clearance on R Seated SLR bil x 10    03/13/23 Eval only   PATIENT EDUCATION:  Education details: PT eval findings, anticipated POC, need for further assessment of balance, and gait safety with rolling walker   Person educated: Patient Education method: Explanation Education comprehension: verbalized understanding and needs further education  HOME EXERCISE PROGRAM: Access Code: [email or web address] URL: [email or web address] Date: 03/19/2023 Prepared by: Verta Ellen  Exercises - Seated  Hamstring Stretch with Strap  - 1 x daily - 7 x weekly - 3 sets - 30 sec hold - Seated Quad Set  - 1 x daily - 7 x weekly - 3 sets - 10 reps - 5 seconds hold - Seated Hip Adduction Squeeze with Ball  - 1 x daily - 7 x weekly - 3 sets - 10 reps - 5 secs hold   ASSESSMENT:  CLINICAL IMPRESSION: Completed standardized balance testing with all test revealing high risk for falls.  Trigo "[name]" continues to demonstrate poor safety with RW, with cues necessary to improve posture and RW proximity.  Patient also very unsafe with stair negotiation, attempting to navigate reciprocally but having difficulty clearing toes over edge of  step on ascent as well as decreased eccentric control on descent, therefore encouraged step to pattern when he needs to navigate stairs at this point in time for improved safety.  He denies any concerns with initial HEP provided last visit.  Will continue to progress flexibility and strengthening in upcoming visits, adding in balance activities as appropriate.  OBJECTIVE IMPAIRMENTS: Abnormal gait, decreased activity tolerance, decreased balance, decreased coordination, decreased endurance, decreased knowledge of condition, decreased knowledge of use of DME, decreased mobility, difficulty walking, decreased ROM, decreased strength, decreased safety awareness, increased fascial restrictions, impaired perceived functional ability, increased muscle spasms, impaired flexibility, impaired sensation, improper body mechanics, postural dysfunction, and pain.   ACTIVITY LIMITATIONS: carrying, lifting, bending, sitting, standing, squatting, sleeping, stairs, transfers, bed mobility, bathing, locomotion level, and caring for others  PARTICIPATION LIMITATIONS: meal prep, cleaning, laundry, driving, shopping, and community activity  PERSONAL FACTORS: Age, Behavior pattern, Fitness, Past/current experiences, Time since onset of injury/illness/exacerbation, and 3+ comorbidities: L2-3, L3-4  Sublaminar Decompression on 12/27/22; Revision of femoral component of L THA on 07/02/22 s/p fall with periprosthetic fracture; Aortic atherosclerosis; carotid stenosis; 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   REHAB POTENTIAL: Good  CLINICAL DECISION MAKING: Evolving/moderate complexity  EVALUATION COMPLEXITY: Moderate   GOALS: Goals reviewed with patient? Yes  SHORT TERM GOALS: Target date: 04/10/2023  Patient will be independent with initial HEP. Baseline:  Goal status: IN PROGRESS  2.  Complete balance testing and establish additional STG's/LTG's as appropriate. Baseline:  Goal status: MET  03/22/23  3.  Patient will improve 5x STS time to </= 16 seconds with good eccentric control for improved efficiency and safety with transfers Baseline: 18.94 sec w/o UE assist (uncontrolled descent on most reps) Goal status: INITIAL   4.  Patient will demonstrate decreased TUG time to </= 18 sec to decrease risk for falls with transitional mobility Baseline: 21.29 sec with RW Goal status: INITIAL   LONG TERM GOALS: Target date: 05/08/2023  Patient will be independent with advanced/ongoing HEP to improve outcomes and carryover.  Baseline:  Goal status: IN PROGRESS  2.  Patient will report at least 50-75% improvement in B knee pain with no further instances of subluxation to improve QOL. Baseline: 5/10 Goal status: IN PROGRESS  3.  Patient will demonstrate improved B knee extension AROM to [facility] to allow for improved stability for normal gait and stair mechanics. Baseline: Refer to above LE ROM table Goal status:  IN PROGRESS  4.  Patient will demonstrate improved B LE strength to >/= 4 to 4+/5 for improved stability and ease of mobility. Baseline: Refer to above LE MMT table Goal status: IN PROGRESS  5.  Patient will be able to ambulate 400' with LRAD and normal gait pattern without increased pain to access community.  Baseline:  Goal status: IN PROGRESS  6. Patient will be able to ascend/descend stairs with 1 HR and reciprocal step pattern safely to access home and community.  Baseline:  Goal status: IN PROGRESS  7.  Patient will report >/= 40/80 on LEFS to demonstrate improved functional ability. Baseline: 31 / 80 = 38.8 % Goal status: IN PROGRESS  8.  Patient will demonstrate at least 19/24 on  DGI to decrease risk of falls. Baseline: 11/24 (03/22/23) Goal status: IN PROGRESS   9.  Patient will improve Berg score by at least 8 points to improve safety and stability with ADLs in standing and reduce risk for falls. Baseline: 35/56 (03/22/23) Goal status: IN PROGRESS   PLAN:  PT FREQUENCY: 2x/week  PT DURATION: 8 weeks  PLANNED INTERVENTIONS: Therapeutic exercises, Therapeutic activity, Neuromuscular re-education, Balance training, Gait training, Patient/Family education, Self Care, Joint mobilization, Stair training, DME instructions, Aquatic Therapy, Dry Needling, Electrical stimulation, Spinal mobilization, Cryotherapy, Moist heat, Taping, Ultrasound, and Manual therapy  PLAN FOR NEXT SESSION: Review and progress HEP - L knee ROM, LE flexibility and strengthening; assess LE flexibility  Marry Guan, PT 03/22/2023, 4:56 PM

## 2023-03-26 ENCOUNTER — Ambulatory Visit: Payer: Medicare HMO

## 2023-03-26 DIAGNOSIS — R2689 Other abnormalities of gait and mobility: Secondary | ICD-10-CM

## 2023-03-26 DIAGNOSIS — M5459 Other low back pain: Secondary | ICD-10-CM

## 2023-03-26 DIAGNOSIS — R2681 Unsteadiness on feet: Secondary | ICD-10-CM | POA: Diagnosis not present

## 2023-03-26 DIAGNOSIS — G8929 Other chronic pain: Secondary | ICD-10-CM | POA: Diagnosis not present

## 2023-03-26 DIAGNOSIS — M25561 Pain in right knee: Secondary | ICD-10-CM | POA: Diagnosis not present

## 2023-03-26 DIAGNOSIS — M25562 Pain in left knee: Secondary | ICD-10-CM | POA: Diagnosis not present

## 2023-03-26 DIAGNOSIS — M6281 Muscle weakness (generalized): Secondary | ICD-10-CM | POA: Diagnosis not present

## 2023-03-26 DIAGNOSIS — R296 Repeated falls: Secondary | ICD-10-CM

## 2023-03-26 NOTE — Therapy (Signed)
OUTPATIENT PHYSICAL THERAPY TREATMENT   Patient Name: [name] Pratt MRN: [id number] DOB:Sep 13, 1940, 82 y.o., male Today's Date: 03/26/2023   END OF SESSION:  PT End of Session - 03/26/23 1323     Visit Number 4    Date for PT Re-Evaluation 05/08/23    Authorization Type [name]    Authorization Time Period 03/13/23 - 05/08/23    Authorization - Visit Number 3    Authorization - Number of Visits 10    Progress Note Due on Visit 10    PT Start Time 1316    PT Stop Time 1359    PT Time Calculation (min) 43 min    Activity Tolerance Patient tolerated treatment well    Behavior During Therapy [facility] for tasks assessed/performed               Past Medical History:  Diagnosis Date   Allergic rhinitis 02/02/2016   Anemia 12/17/2009   Formatting of this note might be different from the original. 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 Hypertension   Benign paroxysmal positional vertigo 09/14/2021   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HCC) 07/22/2021   Body mass index (BMI) 25.0-25.9, adult 11/02/2020   Cancer (HCC)    skin cancer   Cardiac murmur 07/25/2021   Carotid stenosis    a. Carotid U/S 5/13: LICA < 50%, RICA 50-69%;  b.  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   Cystoid macular edema of both eyes 07/28/2020   Degenerative disc disease,  lumbar 06/30/2015   Deviated nasal septum 02/18/2015   Diverticulosis 03/03/2020   DJD (degenerative joint disease) of hip 12/24/2012   Dyspnea 06/12/2011   Emphysema, unspecified (HCC) 03/05/2020   Erectile dysfunction 06/20/2017   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   Gait disorder 03/02/2021   GERD (gastroesophageal reflux disease)    GOUT 05/13/2009   Qualifier: Diagnosis of  By: Kem Parkinson     Greater trochanteric pain syndrome 07/22/2021   Hand pain, left 01/16/2022   Hearing loss 02/02/2016   Heme positive stool 02/29/2020   HIATAL HERNIA 05/13/2009   Qualifier: Diagnosis of  By: Kem Parkinson     High risk medication use 01/18/2012   Hip pain 03/02/2021   History of chicken pox    History of COVID-19 12/05/2021   History of hemorrhoids    History of kidney stones    History of skin cancer 07/22/2021   skin cancer   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 Cardiology - Dr Estrella Myrtle at [facility] cardiology  ICD-10 cut over   Hypertension    under control   Hypokalemia 07/08/2021   Hypothyroidism  Internal hemorrhoids 03/03/2020   Leg cramps 11/13/2019   Lower GI bleed 12/12/2012   Lumbago of lumbar region with sciatica 10/21/2020   Lumbar radiculopathy 03/02/2021   Lumbar spondylosis 11/02/2020   NEPHROLITHIASIS 05/13/2009   Qualifier: Diagnosis of  By: Kem Parkinson     Nonspecific abnormal electrocardiogram (ECG) (EKG) 01/06/2013   Numbness of hand 01/24/2022   Onychomycosis 06/30/2015   Osteoarthrosis, 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 Osteoarthritis Of The Hand  10/1 IMO update   Osteoarthrosis, unspecified whether generalized or localized, pelvic region and thigh  07/25/2010   Formatting of this note might be different from the original. Osteoarthritis Of The Hip   Other abnormal glucose 07/22/2021   Other allergic rhinitis 02/18/2015   Palpitations 07/25/2021   Peripheral neuropathy 06/19/2016   Preventative health care 11/24/2015   Right groin pain 11/29/2012   Right inguinal hernia 05/13/2009   Qualifier: Diagnosis of  By: Kem Parkinson     RLS (restless legs syndrome) 02/18/2015   Rosacea    Rotator cuff tear 07/27/2013   Situational depression 03/02/2021   Skin lesion 01/16/2022   Sleep apnea with use of continuous positive airway pressure (CPAP) 07/15/2013   CPAP set to  7 cm water,  Residual AHi was 7.8  With no leak.  User time 6 hours.  479 days , not used only 12 days - highly compliant 06-23-13  .    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 Fawn Kirk   Ulnar neuropathy 01/24/2022   Urinary retention 11/26/2020   Ventral hernia 07/08/2012   Vertigo 07/08/2021   Weight loss 03/03/2020   Past Surgical History:  Procedure Laterality Date   ABDOMINAL HERNIA REPAIR  07/15/12   Dr Ashley Jacobs   ANTERIOR CERVICAL DECOMP/DISCECTOMY FUSION N/A 07/09/2020   Procedure: ANTERIOR CERVICAL DECOMPRESSION AND FUSION CERVICAL THREE-FOUR.;  Surgeon: Donalee Citrin, MD;  [facility];  Service: Neurosurgery;  Laterality: N/A;  anterior   BROW LIFT  05/07/01   COLONOSCOPY WITH PROPOFOL N/A 04/24/2022   Procedure: COLONOSCOPY WITH PROPOFOL;  Surgeon: Tressia Danas, MD;  Location: [facility] ENDOSCOPY;  Service: Gastroenterology;  Laterality: N/A;   COLONOSCOPY WITH PROPOFOL N/A 04/26/2022   Procedure: COLONOSCOPY WITH PROPOFOL;  Surgeon: Tressia Danas, MD;  Location: [facility]  ENDOSCOPY;  Service: Gastroenterology;  Laterality: N/A;   CYSTOSCOPY WITH URETEROSCOPY AND STENT PLACEMENT Right 01/28/2014   Procedure: CYSTOSCOPY WITH URETEROSCOPY, BASKET RETRIVAL AND  STENT PLACEMENT;  Surgeon: Valetta Fuller, MD;  Location: [facility] ORS;  Service: Urology;  Laterality: Right;   epidural injections     multiple procedures   ESOPHAGEAL DILATION  1993 and 1994   multiple times   EYE SURGERY Bilateral 03/25/10, 2012   cataract removal   HEMORRHOID SURGERY  2014   HIATAL HERNIA REPAIR  12/21/93   HOLMIUM LASER APPLICATION Right 01/28/2014   Procedure: HOLMIUM LASER APPLICATION;  Surgeon: Valetta Fuller, MD;  Location: [facility] ORS;  Service: Urology;  Laterality: Right;   INGUINAL HERNIA REPAIR Right 07/15/12   Dr Ashley Jacobs, x2   JOINT REPLACEMENT  07/25/04   right knee   JOINT REPLACEMENT  07/13/10   left knee   KNEE SURGERY  1995  LAPAROSCOPIC CHOLECYSTECTOMY  09/20/06   with intraoperative cholangiogram and right inguinal herniorrhaphy with mesh    LIGAMENT REPAIR Left 07/2013   shoulder   LITHOTRIPSY     x2   LUMBAR LAMINECTOMY/DECOMPRESSION MICRODISCECTOMY Bilateral 12/27/2022   Procedure: Lumbar Two-Three, Lumbar Three-Four Sublaminar Decompression;  Surgeon: Donalee Citrin, MD;  Location: [facility] OR;  Service: Neurosurgery;  Laterality: Bilateral;   NASAL SEPTUM SURGERY  1975   POLYPECTOMY  04/26/2022   Procedure: POLYPECTOMY;  Surgeon: Tressia Danas, MD;  Location: [facility] ENDOSCOPY;  Service: Gastroenterology;;   POSTERIOR CERVICAL FUSION/FORAMINOTOMY N/A 11/24/2020   Procedure: Posterior Cervical Laminectomy Cervical three-four, Cervical four-five with lateral mass fusion;  Surgeon: Donalee Citrin, MD;  Location: [facility] OR;  Service: Neurosurgery;  Laterality: N/A;   REFRACTIVE SURGERY Bilateral    Right total hip replacement  4/14   SKIN CANCER DESTRUCTION     nose, ear   TONSILLECTOMY  as child   TOTAL HIP ARTHROPLASTY Left    URETHRAL DILATION  1991   Dr. Annabell Howells   Patient Active Problem  List   Diagnosis Date Noted   Spinal stenosis of lumbar region 12/27/2022   Drowsiness 12/07/2022   Preop examination 12/06/2022   Weakness of lower extremity 10/10/2022   Skin tear of right elbow without complication 09/18/2022   Skin tear of left lower leg without complication 09/18/2022   Fall 09/18/2022   Iron deficiency anemia 09/18/2022   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   Vitamin D deficiency 08/07/2022   Closed fracture of left hip (HCC) 08/07/2022   Hand weakness 05/02/2022   Acute kidney injury (HCC) 05/02/2022   Adenomatous polyp of ascending colon    Adenomatous polyp of colon    Acute metabolic encephalopathy 04/24/2022   GI bleed 04/21/2022   Vasovagal syncope    Hemorrhagic shock (HCC)    Hematochezia    Irregular heart beat 02/03/2022   Constipation 02/03/2022   Carpal tunnel syndrome 01/24/2022   Numbness of hand 01/24/2022   Ulnar neuropathy 01/24/2022   Skin lesion 01/16/2022   History of COVID-19 12/05/2021   Benign paroxysmal positional vertigo 09/14/2021   Palpitations 07/25/2021   Cardiac murmur 07/25/2021   History of chicken pox 07/22/2021   History of hemorrhoids 07/22/2021   Hypertension 07/22/2021   COPD (chronic obstructive pulmonary disease) (HCC) 07/22/2021   History of kidney stones 07/22/2021   History of skin cancer 07/22/2021   Complication of anesthesia 07/22/2021   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HCC) 07/22/2021   Other abnormal glucose 07/22/2021   Spondylitis (HCC) 07/22/2021   Greater trochanteric pain syndrome 07/22/2021   Vertigo 07/08/2021   Hypokalemia 07/08/2021   Trochanteric bursitis of left hip 06/30/2021   Cancer (HCC) 04/11/2021   Lumbar radiculopathy 03/02/2021   Gait instability 03/02/2021   Hip pain 03/02/2021   Situational depression 03/02/2021   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   Urinary retention 11/26/2020   Cervical myelopathy  (HCC) 11/24/2020   Body mass index (BMI) 25.0-25.9, adult 11/02/2020   Lumbar spondylosis 11/02/2020   Lumbago of lumbar region with sciatica 10/21/2020   Cystoid macular edema of both eyes 07/28/2020   Status post cervical spinal fusion 07/09/2020   History of total bilateral knee replacement 07/08/2020   Aortic atherosclerosis (HCC) 03/05/2020   Emphysema, unspecified (HCC) 03/05/2020   Arthritis 03/04/2020   Diverticulosis 03/03/2020   Internal hemorrhoids 03/03/2020   Weight loss 03/03/2020   Heme positive stool 02/29/2020   Leg cramps  11/13/2019   Tibialis anterior tendon tear, nontraumatic 11/13/2019   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 07/29/2018   CPAP (continuous positive airway pressure) dependence 03/25/2018   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   Erectile dysfunction 06/20/2017   Stenosis of right carotid artery without cerebral infarction 03/06/2017   Peripheral neuropathy 06/19/2016   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   Hearing loss 02/02/2016   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   Allergic rhinitis 02/02/2016   Atypical chest pain 11/24/2015   Preventative health care 11/24/2015   Onychomycosis 06/30/2015   Degenerative disc disease, lumbar 06/30/2015   Other allergic rhinitis 02/18/2015   Deviated nasal septum 02/18/2015   RLS (restless legs syndrome) 02/18/2015   GERD (gastroesophageal reflux disease) 09/18/2014   Hyperglycemia 03/03/2014   Rotator cuff tear 07/27/2013   Sleep apnea with use of continuous positive airway pressure (CPAP) 07/15/2013   Carotid stenosis 02/24/2013   Nonspecific abnormal electrocardiogram (ECG) (EKG) 01/06/2013   DJD (degenerative joint disease) of hip 12/24/2012   Lower GI bleed 12/12/2012   Right groin pain 11/29/2012   OSA (obstructive sleep apnea) 11/25/2012   Ventral hernia 07/08/2012   High risk medication use 01/18/2012   Dyspnea 06/12/2011   Hyperlipidemia 12/27/2010   Osteoarthrosis,  unspecified whether generalized or localized, pelvic region and thigh 07/25/2010   Osteoarthrosis, hand 06/13/2010   Anemia 12/17/2009   Hypothyroidism 05/13/2009   GOUT 05/13/2009   Benign essential hypertension 05/13/2009   Right inguinal hernia 05/13/2009   HIATAL HERNIA 05/13/2009   NEPHROLITHIASIS 05/13/2009   ROSACEA 05/13/2009    PCP: [name], NP   REFERRING PROVIDER: Susa Raring., MD   REFERRING DIAG:  M25.562, G89.29 (ICD-10-CM) - Chronic pain in left knee  M25.561, G89.29 (ICD-10-CM) - Chronic pain in right knee   THERAPY DIAG:  Chronic pain of both knees  Muscle weakness (generalized)  Unsteadiness on feet  Other abnormalities of gait and mobility  Repeated falls  Other low back pain  RATIONALE FOR EVALUATION AND TREATMENT: Rehabilitation  ONSET DATE: off and on for a couple years  NEXT MD VISIT: none scheduled   SUBJECTIVE:                                                                                                                                                                                                         SUBJECTIVE STATEMENT: Pt reports after doing his HEP the last time, he noticed a lot more weakness in both legs. He reports he is scheduled for an ESI on 04/02/23.  PAIN: Are you having pain? Yes: NPRS scale: 0/10 Pain location: B anterior knee pain with subluxation Pain description: brief sharp, aching Aggravating factors: patellar subluxation, prolonged standing Relieving factors: rubbing his knees with Icy/Hot  Are you having pain? Yes: NPRS scale: 0/10 currently, up to 5/10 Pain location: B flank Pain description: aching, pressure Aggravating factors: getting out of bed Relieving factors: nothing   PERTINENT HISTORY:  L2-3, L3-4 Sublaminar Decompression on 12/27/22; Revision of femoral component of L THA on 07/02/22 s/p fall with periprosthetic fracture; Aortic atherosclerosis; carotid stenosis; cervical  myelopathy s/p ACDF 06/2020 and posterior cervical fusion 11/2020; peripheral neuropathy; lumbar DDD/spondylosis; lumbago with sciatica/lumbar radiculopathy; OA; B TKA; B THA; L greater trochanteric pain syndrome; L RCR 2014; CTR; hearing loss; HTN; BPPV/vertigo; B sacroiliitis; skin cancer; COPD/emphysema; sleep apnea with CPAP; GERD; gout; HLD; LE cramps; RLS   PRECAUTIONS: Fall  WEIGHT BEARING RESTRICTIONS: No  FALLS:  Has patient fallen in last 6 months? Yes. 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  No fracture or bony lesion or other findings.  Impression: Right knee replacement in good condition.   01/31/23 - L knee x-ray: No fracture or bony lesion or arthritic change or other abnormality.  Left  knee replacement looks well-fixed and in excellent position.  Impression: Knee replacement in good condition.   PATIENT SURVEYS:  LEFS 31 / 80 = 38.8 %  COGNITION: Overall cognitive status: Within functional limits for tasks assessed and History of cognitive impairments - at baseline    SENSATION: Intermittent peripheral  neuropathy in B feet  MUSCLE LENGTH: TBA Hamstrings:  ITB:  Piriformis:  Hip flexors:  Quads:  Heelcord:   POSTURE:  rounded shoulders, forward head, and flexed trunk   LOWER EXTREMITY ROM: Limited B hip and ankle ROM Active ROM Right eval Left eval  Knee flexion 104 96  Knee extension -10 -5   Passive ROM Right eval Left eval  Knee flexion    Knee extension 0 0  (Blank rows = not tested)  LOWER EXTREMITY MMT:  MMT Right Eval* Left Eval*  Hip flexion 3+ 4  Hip extension 3- 3+  Hip abduction 4- 4  Hip adduction 4 4  Hip internal rotation 3+ 4-  Hip external rotation 2+ 3-  Knee flexion 4 4  Knee extension 4 4+  Ankle dorsiflexion 4+ 4  Ankle plantarflexion    Ankle inversion    Ankle eversion     (Blank rows = not tested, *tested in sitting)  FUNCTIONAL TESTS: (Remaining tests to be assessed next visit with PT) 5 times sit  to stand: 18.94 sec w/o UE assist (uncontrolled descent on most reps) (03/22/23) Timed up and go (TUG): 21.29 sec with RW (03/22/23) 10 meter walk test: 21.13 sec with RW; Gait speed = 1.55 ft/sec  Berg Balance Scale: 35/56; <36 = High risk for falls (close to 100%) (03/22/23) Dynamic Gait Index: 11/24; Scores of 19 or less are predictive of falls in older community living adults (03/22/23)  GAIT: Distance walked: 60 ft Assistive device utilized: Environmental consultant - 2 wheeled Level of assistance: SBA Gait pattern: step through pattern, decreased stride length, decreased hip/knee flexion- Right, decreased hip/knee flexion- Left, decreased ankle dorsiflexion- Right, decreased ankle dorsiflexion- Left, trunk flexed, poor foot clearance- Right, and poor foot clearance- Left Comments: Cues necessary for improved upright posture and rolling walker proximity   TODAY'S TREATMENT:  03/26/23 THERAPEUTIC EXERCISE: to improve flexibility, strength and mobility.  Demonstration, verbal and tactile cues throughout for technique.  NuStep L5 x 6 min Sit to stands 2x10  with elevated mat (airex pad under bottom) Seated march no UE support x 10 bil Side steps from end to end of 3x with SPC, CGA Clock balance 5x bil with rollator Seated trunk rotation with yellow wt ball x 10   03/22/23 THERAPEUTIC EXERCISE: to improve flexibility, strength and mobility.  Demonstration, verbal and tactile cues throughout for technique.  NuStep L4 x 6 min  THERAPEUTIC ACTIVITIES: 5xSTS: 18.94 sec w/o UE assist (uncontrolled descent on most reps) TUG: 21.29 sec with RW Berg:  35/56; <36 = High risk for falls (close to 100%)  DGI: 11/24; Scores of 19 or less are predictive of falls in older community living adults   GAIT TRAINING: To normalize gait pattern and improve safety with RW/FWW . Gait 200 feet with RW/FWW - cues for upright posture and improved walker proximity - patient demonstrating better foot clearance during swing through with good heel strike on weight acceptance but still noted to catch right toes on occasion Stairs: Level of Assistance: Min A Stair Negotiation Technique: Step to Pattern Alternating Pattern  with Single Rail on Right Number of Stairs: 14  Height of Stairs: 7"  Comments: Patient attempting stairs with reciprocal alternating pattern however having difficulty clearing toes on stair ascent and increased instability with limited eccentric control noted on stair descent - patient encouraged to use step to pattern as needed for safety if needing to navigate stairs in place of alternating pattern.   03/19/23 Therapeutic Exercise: to improve strength and mobility.  Demo, verbal and tactile cues throughout for technique.  Nustep L4x87min Seated heel slides AROM x 10 bil Seated hip ADD with ball 10x3" Seated quad sets 10x5" bil Seated hamstring stretch x 30 sec bil Gait 180 ft around clinic with walker- cues for posture and foot clearance on R Seated SLR bil x 10    03/13/23 Eval only   PATIENT EDUCATION:  Education details: PT eval findings,  anticipated POC, need for further assessment of balance, and gait safety with rolling walker   Person educated: Patient Education method: Explanation Education comprehension: verbalized understanding and needs further education  HOME EXERCISE PROGRAM: Access Code: [email or web address] URL: [email or web address] Date: 03/19/2023 Prepared by: Verta Ellen  Exercises - Seated Hamstring Stretch with Strap  - 1 x daily - 7 x weekly - 3 sets - 30 sec hold - Seated Quad Set  - 1 x daily - 7 x weekly - 3 sets - 10 reps - 5 seconds hold - Seated Hip Adduction Squeeze with Ball  - 1  x daily - 7 x weekly - 3 sets - 10 reps - 5 secs hold   ASSESSMENT:  CLINICAL IMPRESSION: Pt showed good response to treatment. Continued progressing balance and strengthening exercises. Cues for upright posture and to stabilize core with exercises. CGA to min A with side steps for stability. Will continue to progress flexibility and strengthening in upcoming visits, adding in balance activities as appropriate.  OBJECTIVE IMPAIRMENTS: Abnormal gait, decreased activity tolerance, decreased balance, decreased coordination, decreased endurance, decreased knowledge of condition, decreased knowledge of use of DME, decreased mobility, difficulty walking, decreased ROM, decreased strength, decreased safety awareness, increased fascial restrictions, impaired perceived functional ability, increased muscle spasms, impaired flexibility, impaired sensation, improper body mechanics, postural dysfunction, and pain.   ACTIVITY LIMITATIONS: carrying, lifting, bending, sitting, standing, squatting, sleeping, stairs, transfers, bed mobility, bathing, locomotion level, and caring for others  PARTICIPATION LIMITATIONS: meal prep, cleaning, laundry, driving, shopping, and community activity  PERSONAL FACTORS: Age, Behavior pattern, Fitness, Past/current experiences, Time since onset of injury/illness/exacerbation, and 3+ comorbidities:  L2-3, L3-4 Sublaminar Decompression on 12/27/22; Revision of femoral component of L THA on 07/02/22 s/p fall with periprosthetic fracture; Aortic atherosclerosis; carotid stenosis; 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   REHAB POTENTIAL: Good  CLINICAL DECISION MAKING: Evolving/moderate complexity  EVALUATION COMPLEXITY: Moderate   GOALS: Goals reviewed with patient? Yes  SHORT TERM GOALS: Target date: 04/10/2023  Patient will be independent with initial HEP. Baseline:  Goal status: IN PROGRESS  2.  Complete balance testing and establish additional STG's/LTG's as appropriate. Baseline:  Goal status: MET  03/22/23  3.  Patient will improve 5x STS time to </= 16 seconds with good eccentric control for improved efficiency and safety with transfers Baseline: 18.94 sec w/o UE assist (uncontrolled descent on most reps) Goal status: INITIAL   4.  Patient will demonstrate decreased TUG time to </= 18 sec to decrease risk for falls with transitional mobility Baseline: 21.29 sec with RW Goal status: INITIAL   LONG TERM GOALS: Target date: 05/08/2023  Patient will be independent with advanced/ongoing HEP to improve outcomes and carryover.  Baseline:  Goal status: IN PROGRESS  2.  Patient will report at least 50-75% improvement in B knee pain with no further instances of subluxation to improve QOL. Baseline: 5/10 Goal status: IN PROGRESS  3.  Patient will demonstrate improved B knee extension AROM to [facility] to allow for improved stability for normal gait and stair mechanics. Baseline: Refer to above LE ROM  table Goal status: IN PROGRESS  4.  Patient will demonstrate improved B LE strength to >/= 4 to 4+/5 for improved stability and ease of mobility. Baseline: Refer to above LE MMT table Goal status: IN PROGRESS  5.  Patient will be able to ambulate 400' with LRAD and normal gait pattern without increased pain to access community.  Baseline:  Goal status: IN PROGRESS  6. Patient will be able to ascend/descend stairs with 1 HR and reciprocal step pattern safely to access home and community.  Baseline:  Goal status: IN PROGRESS  7.  Patient will report >/= 40/80 on LEFS to demonstrate improved functional ability. Baseline: 31 / 80 = 38.8 % Goal status: IN  PROGRESS  8.  Patient will demonstrate at least 19/24 on DGI to decrease risk of falls. Baseline: 11/24 (03/22/23) Goal status: IN PROGRESS   9.  Patient will improve Berg score by at least 8 points to improve safety and stability with ADLs in standing and reduce risk for falls. Baseline: 35/56 (03/22/23) Goal status: IN PROGRESS   PLAN:  PT FREQUENCY: 2x/week  PT DURATION: 8 weeks  PLANNED INTERVENTIONS: Therapeutic exercises, Therapeutic activity, Neuromuscular re-education, Balance training, Gait training, Patient/Family education, Self Care, Joint mobilization, Stair training, DME instructions, Aquatic Therapy, Dry Needling, Electrical stimulation, Spinal mobilization, Cryotherapy, Moist heat, Taping, Ultrasound, and Manual therapy  PLAN FOR NEXT SESSION: Review and progress HEP - L knee ROM, LE flexibility and strengthening; assess LE flexibility  Darleene Cleaver, PTA 03/26/2023, 1:59 PM

## 2023-03-29 ENCOUNTER — Encounter: Payer: Self-pay | Admitting: Physical Therapy

## 2023-03-29 ENCOUNTER — Ambulatory Visit: Payer: Medicare HMO | Admitting: Physical Therapy

## 2023-03-29 DIAGNOSIS — M6281 Muscle weakness (generalized): Secondary | ICD-10-CM | POA: Diagnosis not present

## 2023-03-29 DIAGNOSIS — R2681 Unsteadiness on feet: Secondary | ICD-10-CM | POA: Diagnosis not present

## 2023-03-29 DIAGNOSIS — G8929 Other chronic pain: Secondary | ICD-10-CM | POA: Diagnosis not present

## 2023-03-29 DIAGNOSIS — M5459 Other low back pain: Secondary | ICD-10-CM

## 2023-03-29 DIAGNOSIS — R296 Repeated falls: Secondary | ICD-10-CM

## 2023-03-29 DIAGNOSIS — M25561 Pain in right knee: Secondary | ICD-10-CM | POA: Diagnosis not present

## 2023-03-29 DIAGNOSIS — R2689 Other abnormalities of gait and mobility: Secondary | ICD-10-CM | POA: Diagnosis not present

## 2023-03-29 DIAGNOSIS — M25562 Pain in left knee: Secondary | ICD-10-CM | POA: Diagnosis not present

## 2023-03-29 NOTE — Therapy (Signed)
OUTPATIENT PHYSICAL THERAPY TREATMENT   Patient Name: [name] Pratt MRN: [id number] DOB:Sep 19, 1940, 82 y.o., male Today's Date: 03/29/2023   END OF SESSION:  PT End of Session - 03/29/23 1445     Visit Number 5    Date for PT Re-Evaluation 05/08/23    Authorization Type [name]    Authorization Time Period 03/13/23 - 05/08/23    Authorization - Visit Number 4    Authorization - Number of Visits 10    Progress Note Due on Visit 10    PT Start Time 1445    PT Stop Time 1531    PT Time Calculation (min) 46 min    Activity Tolerance Patient tolerated treatment well    Behavior During Therapy [facility] for tasks assessed/performed               Past Medical History:  Diagnosis Date   Allergic rhinitis 02/02/2016   Anemia 12/17/2009   Formatting of this note might be different from the original. 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 Hypertension   Benign paroxysmal positional vertigo 09/14/2021   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HCC) 07/22/2021   Body mass index (BMI) 25.0-25.9, adult 11/02/2020   Cancer (HCC)    skin cancer   Cardiac murmur 07/25/2021   Carotid stenosis    a. Carotid U/S 5/13: LICA < 50%, RICA 50-69%;  b.  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   Cystoid macular edema of both eyes 07/28/2020   Degenerative disc disease,  lumbar 06/30/2015   Deviated nasal septum 02/18/2015   Diverticulosis 03/03/2020   DJD (degenerative joint disease) of hip 12/24/2012   Dyspnea 06/12/2011   Emphysema, unspecified (HCC) 03/05/2020   Erectile dysfunction 06/20/2017   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   Gait disorder 03/02/2021   GERD (gastroesophageal reflux disease)    GOUT 05/13/2009   Qualifier: Diagnosis of  By: Kem Parkinson     Greater trochanteric pain syndrome 07/22/2021   Hand pain, left 01/16/2022   Hearing loss 02/02/2016   Heme positive stool 02/29/2020   HIATAL HERNIA 05/13/2009   Qualifier: Diagnosis of  By: Kem Parkinson     High risk medication use 01/18/2012   Hip pain 03/02/2021   History of chicken pox    History of COVID-19 12/05/2021   History of hemorrhoids    History of kidney stones    History of skin cancer 07/22/2021   skin cancer   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 Cardiology - Dr Estrella Myrtle at [facility] cardiology  ICD-10 cut over   Hypertension    under control   Hypokalemia 07/08/2021   Hypothyroidism  Internal hemorrhoids 03/03/2020   Leg cramps 11/13/2019   Lower GI bleed 12/12/2012   Lumbago of lumbar region with sciatica 10/21/2020   Lumbar radiculopathy 03/02/2021   Lumbar spondylosis 11/02/2020   NEPHROLITHIASIS 05/13/2009   Qualifier: Diagnosis of  By: Kem Parkinson     Nonspecific abnormal electrocardiogram (ECG) (EKG) 01/06/2013   Numbness of hand 01/24/2022   Onychomycosis 06/30/2015   Osteoarthrosis, 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 Osteoarthritis Of The Hand  10/1 IMO update   Osteoarthrosis, unspecified whether generalized or localized, pelvic region and thigh  07/25/2010   Formatting of this note might be different from the original. Osteoarthritis Of The Hip   Other abnormal glucose 07/22/2021   Other allergic rhinitis 02/18/2015   Palpitations 07/25/2021   Peripheral neuropathy 06/19/2016   Preventative health care 11/24/2015   Right groin pain 11/29/2012   Right inguinal hernia 05/13/2009   Qualifier: Diagnosis of  By: Kem Parkinson     RLS (restless legs syndrome) 02/18/2015   Rosacea    Rotator cuff tear 07/27/2013   Situational depression 03/02/2021   Skin lesion 01/16/2022   Sleep apnea with use of continuous positive airway pressure (CPAP) 07/15/2013   CPAP set to  7 cm water,  Residual AHi was 7.8  With no leak.  User time 6 hours.  479 days , not used only 12 days - highly compliant 06-23-13  .    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 Fawn Kirk   Ulnar neuropathy 01/24/2022   Urinary retention 11/26/2020   Ventral hernia 07/08/2012   Vertigo 07/08/2021   Weight loss 03/03/2020   Past Surgical History:  Procedure Laterality Date   ABDOMINAL HERNIA REPAIR  07/15/12   Dr Ashley Jacobs   ANTERIOR CERVICAL DECOMP/DISCECTOMY FUSION N/A 07/09/2020   Procedure: ANTERIOR CERVICAL DECOMPRESSION AND FUSION CERVICAL THREE-FOUR.;  Surgeon: Donalee Citrin, MD;  Location: [facility] OR;  Service: Neurosurgery;  Laterality: N/A;  anterior   BROW LIFT  05/07/01   COLONOSCOPY WITH PROPOFOL N/A 04/24/2022   Procedure: COLONOSCOPY WITH PROPOFOL;  Surgeon: Tressia Danas, MD;  Location: [facility] ENDOSCOPY;  Service: Gastroenterology;  Laterality: N/A;   COLONOSCOPY WITH PROPOFOL N/A 04/26/2022   Procedure: COLONOSCOPY WITH PROPOFOL;  Surgeon: Tressia Danas, MD;  Location: [facility]  ENDOSCOPY;  Service: Gastroenterology;  Laterality: N/A;   CYSTOSCOPY WITH URETEROSCOPY AND STENT PLACEMENT Right 01/28/2014   Procedure: CYSTOSCOPY WITH URETEROSCOPY, BASKET RETRIVAL AND  STENT PLACEMENT;  Surgeon: Valetta Fuller, MD;  Location: [facility] ORS;  Service: Urology;  Laterality: Right;   epidural injections     multiple procedures   ESOPHAGEAL DILATION  1993 and 1994   multiple times   EYE SURGERY Bilateral 03/25/10, 2012   cataract removal   HEMORRHOID SURGERY  2014   HIATAL HERNIA REPAIR  12/21/93   HOLMIUM LASER APPLICATION Right 01/28/2014   Procedure: HOLMIUM LASER APPLICATION;  Surgeon: Valetta Fuller, MD;  Location: [facility] ORS;  Service: Urology;  Laterality: Right;   INGUINAL HERNIA REPAIR Right 07/15/12   Dr Ashley Jacobs, x2   JOINT REPLACEMENT  07/25/04   right knee   JOINT REPLACEMENT  07/13/10   left knee   KNEE SURGERY  1995  LAPAROSCOPIC CHOLECYSTECTOMY  09/20/06   with intraoperative cholangiogram and right inguinal herniorrhaphy with mesh    LIGAMENT REPAIR Left 07/2013   shoulder   LITHOTRIPSY     x2   LUMBAR LAMINECTOMY/DECOMPRESSION MICRODISCECTOMY Bilateral 12/27/2022   Procedure: Lumbar Two-Three, Lumbar Three-Four Sublaminar Decompression;  Surgeon: Donalee Citrin, MD;  [facility] OR;  Service: Neurosurgery;  Laterality: Bilateral;   NASAL SEPTUM SURGERY  1975   POLYPECTOMY  04/26/2022   Procedure: POLYPECTOMY;  Surgeon: Tressia Danas, MD;  Location: [facility] ENDOSCOPY;  Service: Gastroenterology;;   POSTERIOR CERVICAL FUSION/FORAMINOTOMY N/A 11/24/2020   Procedure: Posterior Cervical Laminectomy Cervical three-four, Cervical four-five with lateral mass fusion;  Surgeon: Donalee Citrin, MD;  Location: [facility] OR;  Service: Neurosurgery;  Laterality: N/A;   REFRACTIVE SURGERY Bilateral    Right total hip replacement  4/14   SKIN CANCER DESTRUCTION     nose, ear   TONSILLECTOMY  as child   TOTAL HIP ARTHROPLASTY Left    URETHRAL DILATION  1991   Dr. Annabell Howells   Patient Active Problem  List   Diagnosis Date Noted   Spinal stenosis of lumbar region 12/27/2022   Drowsiness 12/07/2022   Preop examination 12/06/2022   Weakness of lower extremity 10/10/2022   Skin tear of right elbow without complication 09/18/2022   Skin tear of left lower leg without complication 09/18/2022   Fall 09/18/2022   Iron deficiency anemia 09/18/2022   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   Vitamin D deficiency 08/07/2022   Closed fracture of left hip (HCC) 08/07/2022   Hand weakness 05/02/2022   Acute kidney injury (HCC) 05/02/2022   Adenomatous polyp of ascending colon    Adenomatous polyp of colon    Acute metabolic encephalopathy 04/24/2022   GI bleed 04/21/2022   Vasovagal syncope    Hemorrhagic shock (HCC)    Hematochezia    Irregular heart beat 02/03/2022   Constipation 02/03/2022   Carpal tunnel syndrome 01/24/2022   Numbness of hand 01/24/2022   Ulnar neuropathy 01/24/2022   Skin lesion 01/16/2022   History of COVID-19 12/05/2021   Benign paroxysmal positional vertigo 09/14/2021   Palpitations 07/25/2021   Cardiac murmur 07/25/2021   History of chicken pox 07/22/2021   History of hemorrhoids 07/22/2021   Hypertension 07/22/2021   COPD (chronic obstructive pulmonary disease) (HCC) 07/22/2021   History of kidney stones 07/22/2021   History of skin cancer 07/22/2021   Complication of anesthesia 07/22/2021   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HCC) 07/22/2021   Other abnormal glucose 07/22/2021   Spondylitis (HCC) 07/22/2021   Greater trochanteric pain syndrome 07/22/2021   Vertigo 07/08/2021   Hypokalemia 07/08/2021   Trochanteric bursitis of left hip 06/30/2021   Cancer (HCC) 04/11/2021   Lumbar radiculopathy 03/02/2021   Gait instability 03/02/2021   Hip pain 03/02/2021   Situational depression 03/02/2021   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   Urinary retention 11/26/2020   Cervical myelopathy  (HCC) 11/24/2020   Body mass index (BMI) 25.0-25.9, adult 11/02/2020   Lumbar spondylosis 11/02/2020   Lumbago of lumbar region with sciatica 10/21/2020   Cystoid macular edema of both eyes 07/28/2020   Status post cervical spinal fusion 07/09/2020   History of total bilateral knee replacement 07/08/2020   Aortic atherosclerosis (HCC) 03/05/2020   Emphysema, unspecified (HCC) 03/05/2020   Arthritis 03/04/2020   Diverticulosis 03/03/2020   Internal hemorrhoids 03/03/2020   Weight loss 03/03/2020   Heme positive stool 02/29/2020   Leg cramps  11/13/2019   Tibialis anterior tendon tear, nontraumatic 11/13/2019   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 07/29/2018   CPAP (continuous positive airway pressure) dependence 03/25/2018   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   Erectile dysfunction 06/20/2017   Stenosis of right carotid artery without cerebral infarction 03/06/2017   Peripheral neuropathy 06/19/2016   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   Hearing loss 02/02/2016   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   Allergic rhinitis 02/02/2016   Atypical chest pain 11/24/2015   Preventative health care 11/24/2015   Onychomycosis 06/30/2015   Degenerative disc disease, lumbar 06/30/2015   Other allergic rhinitis 02/18/2015   Deviated nasal septum 02/18/2015   RLS (restless legs syndrome) 02/18/2015   GERD (gastroesophageal reflux disease) 09/18/2014   Hyperglycemia 03/03/2014   Rotator cuff tear 07/27/2013   Sleep apnea with use of continuous positive airway pressure (CPAP) 07/15/2013   Carotid stenosis 02/24/2013   Nonspecific abnormal electrocardiogram (ECG) (EKG) 01/06/2013   DJD (degenerative joint disease) of hip 12/24/2012   Lower GI bleed 12/12/2012   Right groin pain 11/29/2012   OSA (obstructive sleep apnea) 11/25/2012   Ventral hernia 07/08/2012   High risk medication use 01/18/2012   Dyspnea 06/12/2011   Hyperlipidemia 12/27/2010   Osteoarthrosis,  unspecified whether generalized or localized, pelvic region and thigh 07/25/2010   Osteoarthrosis, hand 06/13/2010   Anemia 12/17/2009   Hypothyroidism 05/13/2009   GOUT 05/13/2009   Benign essential hypertension 05/13/2009   Right inguinal hernia 05/13/2009   HIATAL HERNIA 05/13/2009   NEPHROLITHIASIS 05/13/2009   ROSACEA 05/13/2009    PCP: [name], NP   REFERRING PROVIDER: Susa Raring., MD   REFERRING DIAG:  M25.562, G89.29 (ICD-10-CM) - Chronic pain in left knee  M25.561, G89.29 (ICD-10-CM) - Chronic pain in right knee   THERAPY DIAG:  Chronic pain of both knees  Muscle weakness (generalized)  Unsteadiness on feet  Other abnormalities of gait and mobility  Repeated falls  Other low back pain  RATIONALE FOR EVALUATION AND TREATMENT: Rehabilitation  ONSET DATE: off and on for a couple years  NEXT MD VISIT: 04/06/23   SUBJECTIVE:                                                                                                                                                                                                         SUBJECTIVE STATEMENT: [name] Pratt reports he was walking around [place] yesterday for ~3 hrs and felt like his legs would give out by the time he was done. He reports he is scheduled for  an ESI on 04/02/23 and will f/u with Dr. Samuel Bouche on 04/06/23.   PAIN: Are you having pain? Yes: NPRS scale: 0/10 Pain location: B anterior knee pain with subluxation Pain description: brief sharp, aching Aggravating factors: patellar subluxation, prolonged standing Relieving factors: rubbing his knees with Icy/Hot  Are you having pain? Yes: NPRS scale: 0/10 currently, up to 5/10 Pain location: B flank Pain description: aching, pressure Aggravating factors: getting out of bed Relieving factors: nothing   PERTINENT HISTORY:  L2-3, L3-4 Sublaminar Decompression on 12/27/22; Revision of femoral component of L THA on 07/02/22 s/p fall with  periprosthetic fracture; Aortic atherosclerosis; carotid stenosis; cervical myelopathy s/p ACDF 06/2020 and posterior cervical fusion 11/2020; peripheral neuropathy; lumbar DDD/spondylosis; lumbago with sciatica/lumbar radiculopathy; OA; B TKA; B THA; L greater trochanteric pain syndrome; L RCR 2014; CTR; hearing loss; HTN; BPPV/vertigo; B sacroiliitis; skin cancer; COPD/emphysema; sleep apnea with CPAP; GERD; gout; HLD; LE cramps; RLS   PRECAUTIONS: Fall  WEIGHT BEARING RESTRICTIONS: No  FALLS:  Has patient fallen in last 6 months? Yes. 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  No fracture or bony lesion or other findings.  Impression: Right knee replacement in good condition.   01/31/23 - L knee x-ray: No fracture or bony lesion or arthritic change or other abnormality.  Left  knee replacement looks well-fixed and in excellent position.  Impression: Knee replacement in good condition.   PATIENT SURVEYS:  LEFS 31 / 80 = 38.8 %  COGNITION: Overall cognitive status: Within functional limits for tasks assessed and History of  cognitive impairments - at baseline    SENSATION: Intermittent peripheral neuropathy in B feet  MUSCLE LENGTH: TBA Hamstrings:  ITB:  Piriformis:  Hip flexors:  Quads:  Heelcord:   POSTURE:  rounded shoulders, forward head, and flexed trunk   LOWER EXTREMITY ROM: Limited B hip and ankle ROM Active ROM Right eval Left eval  Knee flexion 104 96  Knee extension -10 -5   Passive ROM Right eval Left eval  Knee flexion    Knee extension 0 0  (Blank rows = not tested)  LOWER EXTREMITY MMT:  MMT Right Eval* Left Eval*  Hip flexion 3+ 4  Hip extension 3- 3+  Hip abduction 4- 4  Hip adduction 4 4  Hip internal rotation 3+ 4-  Hip external rotation 2+ 3-  Knee flexion 4 4  Knee extension 4 4+  Ankle dorsiflexion 4+ 4  Ankle plantarflexion    Ankle inversion    Ankle eversion     (Blank rows = not tested, *tested in sitting)  FUNCTIONAL TESTS: (Remaining tests to be assessed next visit with PT) 5 times sit to stand: 18.94 sec w/o UE assist (uncontrolled descent on most reps) (03/22/23) Timed up and go (TUG): 21.29 sec with RW (03/22/23) 10 meter walk test: 21.13 sec with RW; Gait speed = 1.55 ft/sec  Berg Balance Scale: 35/56; <36 = High risk for falls (close to 100%) (03/22/23) Dynamic Gait Index: 11/24; Scores of 19 or less are predictive of falls in older community living adults (03/22/23)  GAIT: Distance walked: 60 ft Assistive device utilized: Environmental consultant - 2 wheeled Level of assistance: SBA Gait pattern: step through pattern, decreased stride length, decreased hip/knee flexion- Right, decreased hip/knee flexion- Left, decreased ankle dorsiflexion- Right, decreased ankle dorsiflexion- Left, trunk flexed, poor foot clearance- Right, and poor foot clearance- Left Comments: Cues necessary for improved upright posture and rolling walker proximity   TODAY'S TREATMENT:   03/29/23 THERAPEUTIC EXERCISE: to improve flexibility, strength and mobility.  Demonstration, verbal and  tactile cues throughout for technique.  Rec Bike - L1 x 6 min R long sitting quad set/TKE into small ball 10 x 5" Seated hip ADD isometric ball squeeze + R/L LAQ x 10 Hooklying hip ADD isometric ball squeeze + R/L SAQ over 8" FR 2 x 10 Bridge x 10 Hooklying RTB alt bent-knee fallout 2 x 10 Bridge + RTB hip ABD isometric x 10 Bridge + hip ADD ball squeeze x 10   03/26/23 THERAPEUTIC EXERCISE: to improve flexibility, strength and mobility.  Demonstration, verbal and tactile cues throughout for technique.  NuStep L5 x 6 min Sit to stands 2x10 with elevated mat (airex pad under bottom) Seated march no UE support x 10 bil Side steps from end to end of 3x with SPC, CGA Clock balance 5x bil with rollator Seated trunk rotation with yellow wt ball x 10    03/22/23 THERAPEUTIC EXERCISE: to improve flexibility, strength and mobility.  Demonstration, verbal and tactile cues throughout for technique.  NuStep L4 x 6 min  THERAPEUTIC ACTIVITIES: 5xSTS: 18.94 sec w/o UE assist (uncontrolled descent on most reps) TUG: 21.29 sec with RW Berg:  35/56; <36 = High risk for falls (close to 100%)  DGI: 11/24; Scores of 19 or less are predictive of falls in older community living adults   GAIT TRAINING: To normalize gait pattern and improve safety with RW/FWW . Gait 200 feet with RW/FWW - cues for upright posture and improved walker proximity - patient demonstrating better foot clearance during swing through with good heel strike on weight acceptance but still noted to catch right toes on occasion Stairs: Level of Assistance: Min A Stair Negotiation Technique: Step to Pattern Alternating Pattern  with Single Rail on Right Number of Stairs: 14  Height of Stairs: 7"  Comments: Patient attempting stairs with reciprocal alternating pattern however having difficulty clearing toes on stair ascent and increased instability with limited eccentric control noted on stair descent - patient encouraged to use step  to pattern as needed for safety if needing to navigate stairs in place of alternating pattern.   PATIENT EDUCATION:  Education details: HEP progression  Person educated: Patient Education method: Programmer, multimedia, Facilities manager, Verbal cues, and Handouts Education comprehension: verbalized understanding, returned demonstration, verbal cues required, and needs further education  HOME EXERCISE PROGRAM: Access Code: [email or web address] URL: [email or web address] Date: 03/29/2023 Prepared by: Glenetta Hew  Exercises - Seated Hamstring Stretch with Strap  - 1 x daily - 7 x weekly - 3 sets - 30 sec hold - Seated Quad Set  -  1 x daily - 7 x weekly - 3 sets - 10 reps - 5 seconds hold - Seated Hip Adduction Squeeze with Ball  - 1 x daily - 7 x weekly - 3 sets - 10 reps - 5 secs hold - Supine Quad Set on Towel Roll  - 1 x daily - 7 x weekly - 2 sets - 10 reps - 3 sec hold - Hooklying Single Leg Bent Knee Fallouts with Resistance  - 1 x daily - 7 x weekly - 2 sets - 10 reps - 3 sec hold - Supine Bridge with Resistance Band  - 1 x daily - 7 x weekly - 2 sets - 10 reps - 5 sec hold   ASSESSMENT:  CLINICAL IMPRESSION: Greyson Peavy" reports seated quad set seems to make his R knee hurt more, therefore modified this to single LE long sitting with small ball underneath to reduce hyperextension with better tolerance at knee but complains of more hip/low back discomfort.  He demonstrates very limited VMO activation during quad set, therefore introduced SAQ with hip adduction ball squeeze to better recruit VMO - good performance when ball placed between knees, however R LE significantly lacking when attempted with ball between ankles.  Attempted to progress proximal LE strengthening with use of RTB with patient able to perform good return demonstration of exercises but having difficulty with donning and doffing the Thera-Band for exercise performance - RTB provided for trial at home but patient to report  back if having concerns or issues with home performance.  [name] Pratt will continue to benefit from skilled PT to address ongoing flexibility strength and balance deficits to improve mobility and activity tolerance with decreased pain interference and decreased risk for falls.   OBJECTIVE IMPAIRMENTS: Abnormal gait, decreased activity tolerance, decreased balance, decreased coordination, decreased endurance, decreased knowledge of condition, decreased knowledge of use of DME, decreased mobility, difficulty walking, decreased ROM, decreased strength, decreased safety awareness, increased fascial restrictions, impaired perceived functional ability, increased muscle spasms, impaired flexibility, impaired sensation, improper body mechanics, postural dysfunction, and pain.   ACTIVITY LIMITATIONS: carrying, lifting, bending, sitting, standing, squatting, sleeping, stairs, transfers, bed mobility, bathing, locomotion level, and caring for others  PARTICIPATION LIMITATIONS: meal prep, cleaning, laundry, driving, shopping, and community activity  PERSONAL FACTORS: Age, Behavior pattern, Fitness, Past/current experiences, Time since onset of injury/illness/exacerbation, and 3+ comorbidities: L2-3, L3-4 Sublaminar Decompression on 12/27/22; Revision of femoral component of L THA on 07/02/22 s/p fall with periprosthetic fracture; Aortic atherosclerosis; carotid stenosis; 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   REHAB POTENTIAL: Good  CLINICAL DECISION MAKING: Evolving/moderate complexity  EVALUATION COMPLEXITY: Moderate   GOALS: Goals  reviewed with patient? Yes  SHORT TERM GOALS: Target date: 04/10/2023  Patient will be independent with initial HEP. Baseline:  Goal status: IN PROGRESS  2.  Complete balance testing and establish additional STG's/LTG's as appropriate. Baseline:  Goal status: MET  03/22/23  3.  Patient will improve 5x STS time to </= 16 seconds with good eccentric control for improved efficiency and safety with transfers Baseline: 18.94 sec w/o UE assist (uncontrolled descent on most reps) Goal status: IN PROGRESS   4.  Patient will demonstrate decreased TUG time  to </= 18 sec to decrease risk for falls with transitional mobility Baseline: 21.29 sec with RW Goal status: IN PROGRESS   LONG TERM GOALS: Target date: 05/08/2023  Patient will be independent with advanced/ongoing HEP to improve outcomes and carryover.  Baseline:  Goal status: IN PROGRESS  2.  Patient will report at least 50-75% improvement in B knee pain with no further instances of subluxation to improve QOL. Baseline: 5/10 Goal status: IN PROGRESS  3.  Patient will demonstrate improved B knee extension AROM to [facility] to allow for improved stability for normal gait and stair mechanics. Baseline: Refer to above LE ROM table Goal status: IN PROGRESS  4.  Patient will demonstrate improved B LE strength to >/= 4 to 4+/5 for improved stability and ease of mobility. Baseline: Refer to above LE MMT table Goal status: IN PROGRESS  5.  Patient will be able to ambulate 400' with LRAD and normal gait pattern without increased pain to access community.  Baseline:  Goal status: IN PROGRESS  6. Patient will be able to ascend/descend stairs with 1 HR and reciprocal step pattern safely to access home and community.  Baseline:  Goal status: IN PROGRESS  7.  Patient will report >/= 40/80 on LEFS to demonstrate improved functional ability. Baseline: 31 / 80 = 38.8 % Goal status: IN PROGRESS  8.  Patient will demonstrate at least 19/24 on DGI to  decrease risk of falls. Baseline: 11/24 (03/22/23) Goal status: IN PROGRESS   9.  Patient will improve Berg score by at least 8 points to improve safety and stability with ADLs in standing and reduce risk for falls. Baseline: 35/56 (03/22/23) Goal status: IN PROGRESS   PLAN:  PT FREQUENCY: 2x/week  PT DURATION: 8 weeks  PLANNED INTERVENTIONS: Therapeutic exercises, Therapeutic activity, Neuromuscular re-education, Balance training, Gait training, Patient/Family education, Self Care, Joint mobilization, Stair training, DME instructions, Aquatic Therapy, Dry Needling, Electrical stimulation, Spinal mobilization, Cryotherapy, Moist heat, Taping, Ultrasound, and Manual therapy  PLAN FOR NEXT SESSION: Assess response to home performance of HEP update; L knee ROM; B LE flexibility and strengthening; balance training; review and progress HEP as indicated   Marry Guan, PT 03/29/2023, 6:04 PM

## 2023-04-02 DIAGNOSIS — M5416 Radiculopathy, lumbar region: Secondary | ICD-10-CM | POA: Diagnosis not present

## 2023-04-03 ENCOUNTER — Ambulatory Visit: Payer: Medicare HMO

## 2023-04-03 DIAGNOSIS — M5459 Other low back pain: Secondary | ICD-10-CM

## 2023-04-03 DIAGNOSIS — G8929 Other chronic pain: Secondary | ICD-10-CM

## 2023-04-03 DIAGNOSIS — R2689 Other abnormalities of gait and mobility: Secondary | ICD-10-CM | POA: Diagnosis not present

## 2023-04-03 DIAGNOSIS — R2681 Unsteadiness on feet: Secondary | ICD-10-CM

## 2023-04-03 DIAGNOSIS — M6281 Muscle weakness (generalized): Secondary | ICD-10-CM

## 2023-04-03 DIAGNOSIS — M25561 Pain in right knee: Secondary | ICD-10-CM | POA: Diagnosis not present

## 2023-04-03 DIAGNOSIS — R296 Repeated falls: Secondary | ICD-10-CM

## 2023-04-03 DIAGNOSIS — M25562 Pain in left knee: Secondary | ICD-10-CM | POA: Diagnosis not present

## 2023-04-03 NOTE — Therapy (Signed)
OUTPATIENT PHYSICAL THERAPY TREATMENT   Patient Name: [name] Pratt MRN: [id number] DOB:Dec 10, 1940, 82 y.o., male Today's Date: 04/03/2023   END OF SESSION:  PT End of Session - 04/03/23 1539     Visit Number 6    Date for PT Re-Evaluation 05/08/23    Authorization Type [name]    Authorization Time Period 03/13/23 - 05/08/23    Authorization - Number of Visits 10    Progress Note Due on Visit 10    PT Start Time 1533    PT Stop Time 1617    PT Time Calculation (min) 44 min    Activity Tolerance Patient tolerated treatment well    Behavior During Therapy [facility] for tasks assessed/performed                Past Medical History:  Diagnosis Date   Allergic rhinitis 02/02/2016   Anemia 12/17/2009   Formatting of this note might be different from the original. 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 Hypertension   Benign paroxysmal positional vertigo 09/14/2021   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HCC) 07/22/2021   Body mass index (BMI) 25.0-25.9, adult 11/02/2020   Cancer (HCC)    skin cancer   Cardiac murmur 07/25/2021   Carotid stenosis    a. Carotid U/S 5/13: LICA < 50%, RICA 50-69%;  b.  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   Cystoid macular edema of both eyes 07/28/2020   Degenerative disc disease, lumbar 06/30/2015   Deviated nasal  septum 02/18/2015   Diverticulosis 03/03/2020   DJD (degenerative joint disease) of hip 12/24/2012   Dyspnea 06/12/2011   Emphysema, unspecified (HCC) 03/05/2020   Erectile dysfunction 06/20/2017   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   Gait disorder 03/02/2021   GERD (gastroesophageal reflux disease)    GOUT 05/13/2009   Qualifier: Diagnosis of  By: Kem Parkinson     Greater trochanteric pain syndrome 07/22/2021   Hand pain, left 01/16/2022   Hearing loss 02/02/2016   Heme positive stool 02/29/2020   HIATAL HERNIA 05/13/2009   Qualifier: Diagnosis of  By: Kem Parkinson     High risk medication use 01/18/2012   Hip pain 03/02/2021   History of chicken pox    History of COVID-19 12/05/2021   History of hemorrhoids    History of kidney stones    History of skin cancer 07/22/2021   skin cancer   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 Cardiology - Dr Estrella Myrtle at [facility] cardiology  ICD-10 cut over   Hypertension    under control   Hypokalemia 07/08/2021   Hypothyroidism    Internal hemorrhoids 03/03/2020   Leg cramps  11/13/2019   Lower GI bleed 12/12/2012   Lumbago of lumbar region with sciatica 10/21/2020   Lumbar radiculopathy 03/02/2021   Lumbar spondylosis 11/02/2020   NEPHROLITHIASIS 05/13/2009   Qualifier: Diagnosis of  By: Kem Parkinson     Nonspecific abnormal electrocardiogram (ECG) (EKG) 01/06/2013   Numbness of hand 01/24/2022   Onychomycosis 06/30/2015   Osteoarthrosis, 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 Osteoarthritis Of The Hand  10/1 IMO update   Osteoarthrosis, unspecified whether generalized or localized, pelvic region and thigh 07/25/2010   Formatting of this note  might be different from the original. Osteoarthritis Of The Hip   Other abnormal glucose 07/22/2021   Other allergic rhinitis 02/18/2015   Palpitations 07/25/2021   Peripheral neuropathy 06/19/2016   Preventative health care 11/24/2015   Right groin pain 11/29/2012   Right inguinal hernia 05/13/2009   Qualifier: Diagnosis of  By: Kem Parkinson     RLS (restless legs syndrome) 02/18/2015   Rosacea    Rotator cuff tear 07/27/2013   Situational depression 03/02/2021   Skin lesion 01/16/2022   Sleep apnea with use of continuous positive airway pressure (CPAP) 07/15/2013   CPAP set to  7 cm water,  Residual AHi was 7.8  With no leak.  User time 6 hours.  479 days , not used only 12 days - highly compliant 06-23-13  .    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 Fawn Kirk   Ulnar neuropathy 01/24/2022   Urinary retention 11/26/2020   Ventral hernia 07/08/2012   Vertigo 07/08/2021   Weight loss 03/03/2020   Past Surgical History:  Procedure Laterality Date   ABDOMINAL HERNIA REPAIR  07/15/12   Dr Ashley Jacobs   ANTERIOR CERVICAL DECOMP/DISCECTOMY FUSION N/A 07/09/2020   Procedure: ANTERIOR CERVICAL DECOMPRESSION AND FUSION CERVICAL THREE-FOUR.;  Surgeon: Donalee Citrin, MD;  Location: [facility] OR;  Service: Neurosurgery;  Laterality: N/A;  anterior   BROW LIFT  05/07/01   COLONOSCOPY WITH PROPOFOL N/A 04/24/2022   Procedure: COLONOSCOPY WITH PROPOFOL;  Surgeon: Tressia Danas, MD;  Location: [facility] ENDOSCOPY;  Service: Gastroenterology;  Laterality: N/A;   COLONOSCOPY WITH PROPOFOL N/A 04/26/2022   Procedure: COLONOSCOPY WITH PROPOFOL;  Surgeon: Tressia Danas, MD;  Location: [facility] ENDOSCOPY;  Service:  Gastroenterology;  Laterality: N/A;   CYSTOSCOPY WITH URETEROSCOPY AND STENT PLACEMENT Right 01/28/2014   Procedure: CYSTOSCOPY WITH URETEROSCOPY, BASKET RETRIVAL AND  STENT PLACEMENT;  Surgeon: Valetta Fuller, MD;  Location: [facility] ORS;  Service: Urology;  Laterality: Right;   epidural injections     multiple procedures   ESOPHAGEAL DILATION  1993 and 1994   multiple times   EYE SURGERY Bilateral 03/25/10, 2012   cataract removal   HEMORRHOID SURGERY  2014   HIATAL HERNIA REPAIR  12/21/93   HOLMIUM LASER APPLICATION Right 01/28/2014   Procedure: HOLMIUM LASER APPLICATION;  Surgeon: Valetta Fuller, MD;  Location: [facility] ORS;  Service: Urology;  Laterality: Right;   INGUINAL HERNIA REPAIR Right 07/15/12   Dr Ashley Jacobs, x2   JOINT REPLACEMENT  07/25/04   right knee   JOINT REPLACEMENT  07/13/10   left knee   KNEE SURGERY  1995   LAPAROSCOPIC CHOLECYSTECTOMY  09/20/06  with intraoperative cholangiogram and right inguinal herniorrhaphy with mesh    LIGAMENT REPAIR Left 07/2013   shoulder   LITHOTRIPSY     x2   LUMBAR LAMINECTOMY/DECOMPRESSION MICRODISCECTOMY Bilateral 12/27/2022   Procedure: Lumbar Two-Three, Lumbar Three-Four Sublaminar Decompression;  Surgeon: Donalee Citrin, MD;  Location: [facility] OR;  Service: Neurosurgery;  Laterality: Bilateral;   NASAL SEPTUM SURGERY  1975   POLYPECTOMY  04/26/2022   Procedure: POLYPECTOMY;  Surgeon: Tressia Danas, MD;  Location: [facility] ENDOSCOPY;  Service: Gastroenterology;;   POSTERIOR CERVICAL FUSION/FORAMINOTOMY N/A 11/24/2020   Procedure: Posterior Cervical Laminectomy Cervical three-four, Cervical four-five with lateral mass fusion;  Surgeon: Donalee Citrin, MD;  Location: [facility] OR;  Service: Neurosurgery;  Laterality: N/A;   REFRACTIVE SURGERY Bilateral    Right total hip replacement  4/14   SKIN CANCER DESTRUCTION     nose, ear   TONSILLECTOMY  as child   TOTAL HIP ARTHROPLASTY Left    URETHRAL DILATION  1991   Dr. Annabell Howells   Patient Active Problem List   Diagnosis  Date Noted   Spinal stenosis of lumbar region 12/27/2022   Drowsiness 12/07/2022   Preop examination 12/06/2022   Weakness of lower extremity 10/10/2022   Skin tear of right elbow without complication 09/18/2022   Skin tear of left lower leg without complication 09/18/2022   Fall 09/18/2022   Iron deficiency anemia 09/18/2022   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   Vitamin D deficiency 08/07/2022   Closed fracture of left hip (HCC) 08/07/2022   Hand weakness 05/02/2022   Acute kidney injury (HCC) 05/02/2022   Adenomatous polyp of ascending colon    Adenomatous polyp of colon    Acute metabolic encephalopathy 04/24/2022   GI bleed 04/21/2022   Vasovagal syncope    Hemorrhagic shock (HCC)    Hematochezia    Irregular heart beat 02/03/2022   Constipation 02/03/2022   Carpal tunnel syndrome 01/24/2022   Numbness of hand 01/24/2022   Ulnar neuropathy 01/24/2022   Skin lesion 01/16/2022   History of COVID-19 12/05/2021   Benign paroxysmal positional vertigo 09/14/2021   Palpitations 07/25/2021   Cardiac murmur 07/25/2021   History of chicken pox 07/22/2021   History of hemorrhoids 07/22/2021   Hypertension 07/22/2021   COPD (chronic obstructive pulmonary disease) (HCC) 07/22/2021   History of kidney stones 07/22/2021   History of skin cancer 07/22/2021   Complication of anesthesia 07/22/2021   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HCC) 07/22/2021   Other abnormal glucose 07/22/2021   Spondylitis (HCC) 07/22/2021   Greater trochanteric pain syndrome 07/22/2021   Vertigo 07/08/2021   Hypokalemia 07/08/2021   Trochanteric bursitis of left hip 06/30/2021   Cancer (HCC) 04/11/2021   Lumbar radiculopathy 03/02/2021   Gait instability 03/02/2021   Hip pain 03/02/2021   Situational depression 03/02/2021   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   Urinary retention 11/26/2020   Cervical myelopathy (HCC) 11/24/2020    Body mass index (BMI) 25.0-25.9, adult 11/02/2020   Lumbar spondylosis 11/02/2020   Lumbago of lumbar region with sciatica 10/21/2020   Cystoid macular edema of both eyes 07/28/2020   Status post cervical spinal fusion 07/09/2020   History of total bilateral knee replacement 07/08/2020   Aortic atherosclerosis (HCC) 03/05/2020   Emphysema, unspecified (HCC) 03/05/2020   Arthritis 03/04/2020   Diverticulosis 03/03/2020   Internal hemorrhoids 03/03/2020   Weight loss 03/03/2020   Heme positive stool 02/29/2020   Leg cramps 11/13/2019   Tibialis anterior tendon  tear, nontraumatic 11/13/2019   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 07/29/2018   CPAP (continuous positive airway pressure) dependence 03/25/2018   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   Erectile dysfunction 06/20/2017   Stenosis of right carotid artery without cerebral infarction 03/06/2017   Peripheral neuropathy 06/19/2016   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   Hearing loss 02/02/2016   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   Allergic rhinitis 02/02/2016   Atypical chest pain 11/24/2015   Preventative health care 11/24/2015   Onychomycosis 06/30/2015   Degenerative disc disease, lumbar 06/30/2015   Other allergic rhinitis 02/18/2015   Deviated nasal septum 02/18/2015   RLS (restless legs syndrome) 02/18/2015   GERD (gastroesophageal reflux disease) 09/18/2014   Hyperglycemia 03/03/2014   Rotator cuff tear 07/27/2013   Sleep apnea with use of continuous positive airway pressure (CPAP) 07/15/2013   Carotid stenosis 02/24/2013   Nonspecific abnormal electrocardiogram (ECG) (EKG) 01/06/2013   DJD (degenerative joint disease) of hip 12/24/2012   Lower GI bleed 12/12/2012   Right groin pain 11/29/2012   OSA (obstructive sleep apnea) 11/25/2012   Ventral hernia 07/08/2012   High risk medication use 01/18/2012   Dyspnea 06/12/2011   Hyperlipidemia 12/27/2010   Osteoarthrosis, unspecified whether  generalized or localized, pelvic region and thigh 07/25/2010   Osteoarthrosis, hand 06/13/2010   Anemia 12/17/2009   Hypothyroidism 05/13/2009   GOUT 05/13/2009   Benign essential hypertension 05/13/2009   Right inguinal hernia 05/13/2009   HIATAL HERNIA 05/13/2009   NEPHROLITHIASIS 05/13/2009   ROSACEA 05/13/2009    PCP: [name], NP   REFERRING PROVIDER: Susa Raring., MD   REFERRING DIAG:  M25.562, G89.29 (ICD-10-CM) - Chronic pain in left knee  M25.561, G89.29 (ICD-10-CM) - Chronic pain in right knee   THERAPY DIAG:  Chronic pain of both knees  Muscle weakness (generalized)  Unsteadiness on feet  Other abnormalities of gait and mobility  Repeated falls  Other low back pain  RATIONALE FOR EVALUATION AND TREATMENT: Rehabilitation  ONSET DATE: off and on for a couple years  NEXT MD VISIT: 04/06/23   SUBJECTIVE:                                                                                                                                                                                                         SUBJECTIVE STATEMENT: Pt reports good improvement from [facility] yesterday. He has been walking a lot today so his back is tight.  PAIN: Are you having pain? Yes: NPRS scale: 0/10 Pain location: B anterior knee pain with subluxation  Pain description: brief sharp, aching Aggravating factors: patellar subluxation, prolonged standing Relieving factors: rubbing his knees with Icy/Hot  Are you having pain? Yes: NPRS scale: 0/10 currently, up to 5/10 Pain location: B flank Pain description: aching, pressure Aggravating factors: getting out of bed Relieving factors: nothing   PERTINENT HISTORY:  L2-3, L3-4 Sublaminar Decompression on 12/27/22; Revision of femoral component of L THA on 07/02/22 s/p fall with periprosthetic fracture; Aortic atherosclerosis; carotid stenosis; cervical myelopathy s/p ACDF 06/2020 and posterior cervical fusion 11/2020;  peripheral neuropathy; lumbar DDD/spondylosis; lumbago with sciatica/lumbar radiculopathy; OA; B TKA; B THA; L greater trochanteric pain syndrome; L RCR 2014; CTR; hearing loss; HTN; BPPV/vertigo; B sacroiliitis; skin cancer; COPD/emphysema; sleep apnea with CPAP; GERD; gout; HLD; LE cramps; RLS   PRECAUTIONS: Fall  WEIGHT BEARING RESTRICTIONS: No  FALLS:  Has patient fallen in last 6 months? Yes. 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  No fracture or bony lesion or other findings.  Impression: Right knee replacement in good condition.   01/31/23 - L knee x-ray: No fracture or bony lesion or arthritic change or other abnormality.  Left  knee replacement looks well-fixed and in excellent position.  Impression: Knee replacement in good condition.   PATIENT SURVEYS:  LEFS 31 / 80 = 38.8 %  COGNITION: Overall cognitive status: Within functional limits for tasks assessed and History of cognitive impairments - at baseline    SENSATION: Intermittent peripheral neuropathy in B feet  MUSCLE LENGTH: TBA Hamstrings:  ITB:   Piriformis:  Hip flexors:  Quads:  Heelcord:   POSTURE:  rounded shoulders, forward head, and flexed trunk   LOWER EXTREMITY ROM: Limited B hip and ankle ROM Active ROM Right eval Left eval Right 04/03/23 Left 04/03/23  Knee flexion 104 96 110 108  Knee extension -10 -5 4 2    Passive ROM Right eval Left eval  Knee flexion    Knee extension 0 0  (Blank rows = not tested)  LOWER EXTREMITY MMT:  MMT Right Eval* Left Eval*  Hip flexion 3+ 4  Hip extension 3- 3+  Hip abduction 4- 4  Hip adduction 4 4  Hip internal rotation 3+ 4-  Hip external rotation 2+ 3-  Knee flexion 4 4  Knee extension 4 4+  Ankle dorsiflexion 4+ 4  Ankle plantarflexion    Ankle inversion    Ankle eversion     (Blank rows = not tested, *tested in sitting)  FUNCTIONAL TESTS: (Remaining tests to be assessed next visit with PT) 5 times sit to stand: 18.94 sec w/o UE assist (uncontrolled descent  on most reps) (03/22/23) Timed up and go (TUG): 21.29 sec with RW (03/22/23) 10 meter walk test: 21.13 sec with RW; Gait speed = 1.55 ft/sec  Berg Balance Scale: 35/56; <36 = High risk for falls (close to 100%) (03/22/23) Dynamic Gait Index: 11/24; Scores of 19 or less are predictive of falls in older community living adults (03/22/23)  GAIT: Distance walked: 60 ft Assistive device utilized: Environmental consultant - 2 wheeled Level of assistance: SBA Gait pattern: step through pattern, decreased stride length, decreased hip/knee flexion- Right, decreased hip/knee flexion- Left, decreased ankle dorsiflexion- Right, decreased ankle dorsiflexion- Left, trunk flexed, poor foot clearance- Right, and poor foot clearance- Left Comments: Cues necessary for improved upright posture and rolling walker proximity   TODAY'S TREATMENT:  04/03/23 THERAPEUTIC EXERCISE: to improve flexibility, strength and mobility.  Demonstration, verbal and tactile cues throughout for technique. Nustep L5x35min Supine pelvic tilts x 10 supine clams GTB 2 x  10  Supine march GTB 2 x 10 Supine SKTC x 30 sec bil Seated LAQ GTB 2x10  03/29/23 THERAPEUTIC EXERCISE: to improve flexibility, strength and mobility.  Demonstration, verbal and tactile cues throughout for technique.  Rec Bike - L1 x 6 min R long sitting quad set/TKE into small ball 10 x 5" Seated hip ADD isometric ball squeeze + R/L LAQ x 10 Hooklying hip ADD isometric ball squeeze + R/L SAQ over 8" FR 2 x 10 Bridge x 10 Hooklying RTB alt bent-knee fallout 2 x 10 Bridge + RTB hip ABD isometric x 10 Bridge + hip ADD ball squeeze x 10   03/26/23 THERAPEUTIC EXERCISE: to improve flexibility, strength and mobility.  Demonstration, verbal and tactile cues throughout for technique.  NuStep L5 x 6 min Sit to stands 2x10 with elevated mat (airex pad under bottom) Seated march no UE support x 10 bil Side steps from end to end of 3x with SPC, CGA Clock balance 5x bil with rollator Seated trunk rotation with yellow wt ball x 10    03/22/23 THERAPEUTIC EXERCISE: to improve flexibility, strength and mobility.  Demonstration, verbal and tactile cues throughout for technique.  NuStep L4 x 6 min  THERAPEUTIC ACTIVITIES: 5xSTS: 18.94 sec w/o UE assist (uncontrolled descent on most reps) TUG: 21.29 sec with RW Berg:  35/56; <36 = High risk for falls (close to 100%)  DGI: 11/24; Scores of 19 or less are predictive of falls in older community living adults   GAIT TRAINING: To normalize gait pattern and improve safety with RW/FWW . Gait 200 feet with RW/FWW - cues for upright posture and improved walker proximity - patient demonstrating better foot clearance during swing through with good heel strike on weight acceptance but still noted to catch right toes on occasion Stairs: Level of Assistance: Min A Stair Negotiation Technique: Step to Pattern Alternating Pattern  with Single Rail on Right Number of Stairs: 14  Height of Stairs: 7"  Comments: Patient attempting stairs with reciprocal  alternating pattern however having difficulty clearing toes on stair ascent and increased instability with limited eccentric control noted on stair descent - patient encouraged to use step to pattern as needed for safety if needing to navigate stairs in place of alternating pattern.   PATIENT EDUCATION:  Education details: HEP progression  Person educated: Patient Education method: Programmer, multimedia, Facilities manager, Verbal cues, and Handouts Education comprehension: verbalized understanding, returned demonstration, verbal cues required, and needs further education  HOME EXERCISE PROGRAM: Access Code: [email or web address] URL: [email or web address] Date: 03/29/2023 Prepared by: Glenetta Hew  Exercises -  Seated Hamstring Stretch with Strap  - 1 x daily - 7 x weekly - 3 sets - 30 sec hold - Seated Quad Set  - 1 x daily - 7 x weekly - 3 sets - 10 reps - 5 seconds hold - Seated Hip Adduction Squeeze with Ball  - 1 x daily - 7 x weekly - 3 sets - 10 reps - 5 secs hold - Supine Quad Set on Towel Roll  - 1 x daily - 7 x weekly - 2 sets - 10 reps - 3 sec hold - Hooklying Single Leg Bent Knee Fallouts with Resistance  - 1 x daily - 7 x weekly - 2 sets - 10 reps - 3 sec hold - Supine Bridge with Resistance Band  - 1 x daily - 7 x weekly - 2 sets - 10 reps - 5 sec hold   ASSESSMENT:  CLINICAL IMPRESSION: Pt received ESI in beck yesterday, noting improved pain. We kept exercises low level today to avoid straining back. Attempted to add LAQ with GTB for HEP but pt unable to don/doff TB from LE on his own. He shows improved ROM in both knees and no instance of subluxation in R knee lately.  [name] will continue to benefit from skilled PT to address ongoing flexibility strength and balance deficits to improve mobility and activity tolerance with decreased pain interference and decreased risk for falls.   OBJECTIVE IMPAIRMENTS: Abnormal gait, decreased activity tolerance, decreased balance, decreased  coordination, decreased endurance, decreased knowledge of condition, decreased knowledge of use of DME, decreased mobility, difficulty walking, decreased ROM, decreased strength, decreased safety awareness, increased fascial restrictions, impaired perceived functional ability, increased muscle spasms, impaired flexibility, impaired sensation, improper body mechanics, postural dysfunction, and pain.   ACTIVITY LIMITATIONS: carrying, lifting, bending, sitting, standing, squatting, sleeping, stairs, transfers, bed mobility, bathing, locomotion level, and caring for others  PARTICIPATION LIMITATIONS: meal prep, cleaning, laundry, driving, shopping, and community activity  PERSONAL FACTORS: Age, Behavior pattern, Fitness, Past/current experiences, Time since onset of injury/illness/exacerbation, and 3+ comorbidities: L2-3, L3-4 Sublaminar Decompression on 12/27/22; Revision of femoral component of L THA on 07/02/22 s/p fall with periprosthetic fracture; Aortic atherosclerosis; carotid stenosis; 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   REHAB POTENTIAL: Good  CLINICAL DECISION MAKING: Evolving/moderate complexity  EVALUATION COMPLEXITY: Moderate   GOALS: Goals reviewed with patient? Yes  SHORT TERM GOALS: Target date: 04/10/2023  Patient will be independent with initial HEP. Baseline:  Goal status: IN PROGRESS  2.  Complete balance testing and establish additional STG's/LTG's as appropriate. Baseline:  Goal status: MET  03/22/23  3.  Patient will improve 5x STS time to </= 16 seconds with good eccentric control for  improved efficiency and safety with transfers Baseline: 18.94 sec w/o UE assist (uncontrolled descent on most reps) Goal status: IN PROGRESS   4.  Patient will demonstrate decreased TUG time to </= 18 sec to decrease risk for falls with transitional mobility Baseline: 21.29 sec with RW Goal status: IN PROGRESS   LONG TERM GOALS: Target date: 05/08/2023  Patient will be independent with advanced/ongoing HEP to improve outcomes and carryover.  Baseline:  Goal status: IN PROGRESS  2.  Patient will  report at least 50-75% improvement in B knee pain with no further instances of subluxation to improve QOL. Baseline: 5/10 Goal status: IN PROGRESS  3.  Patient will demonstrate improved B knee extension AROM to [facility] to allow for improved stability for normal gait and stair mechanics. Baseline: Refer to above LE ROM table Goal status: IN PROGRESS- checked, see chart 04/03/23  4.  Patient will demonstrate improved B LE strength to >/= 4 to 4+/5 for improved stability and ease of mobility. Baseline: Refer to above LE MMT table Goal status: IN PROGRESS  5.  Patient will be able to ambulate 400' with LRAD and normal gait pattern without increased pain to access community.  Baseline:  Goal status: IN PROGRESS  6. Patient will be able to ascend/descend stairs with 1 HR and reciprocal step pattern safely to access home and community.  Baseline:  Goal status: IN PROGRESS  7.  Patient will report >/= 40/80 on LEFS to demonstrate improved functional ability. Baseline: 31 / 80 = 38.8 % Goal status: IN PROGRESS  8.  Patient will demonstrate at least 19/24 on DGI to decrease risk of falls. Baseline: 11/24 (03/22/23) Goal status: IN PROGRESS   9.  Patient will improve Berg score by at least 8 points to improve safety and stability with ADLs in standing and reduce risk for falls. Baseline: 35/56 (03/22/23) Goal status: IN PROGRESS   PLAN:  PT FREQUENCY: 2x/week  PT DURATION: 8 weeks  PLANNED  INTERVENTIONS: Therapeutic exercises, Therapeutic activity, Neuromuscular re-education, Balance training, Gait training, Patient/Family education, Self Care, Joint mobilization, Stair training, DME instructions, Aquatic Therapy, Dry Needling, Electrical stimulation, Spinal mobilization, Cryotherapy, Moist heat, Taping, Ultrasound, and Manual therapy  PLAN FOR NEXT SESSION: Assess response to home performance of HEP update; L knee ROM; B LE flexibility and strengthening; balance training; review and progress HEP as indicated   Darleene Cleaver, PTA 04/03/2023, 4:42 PM

## 2023-04-05 ENCOUNTER — Telehealth: Payer: Self-pay | Admitting: Family

## 2023-04-05 ENCOUNTER — Encounter: Payer: Medicare HMO | Admitting: Physical Therapy

## 2023-04-05 NOTE — Telephone Encounter (Signed)
Copied from CRM [id number]. Topic: Medicare AWV >> Apr 05, 2023 10:01 AM Payton Doughty wrote: Reason for CRM: LM 04/05/2023 to schedule AWV   [name]; Care Guide Ambulatory Clinical Support [facility] Direct Dial: [phone]

## 2023-04-06 DIAGNOSIS — M19012 Primary osteoarthritis, left shoulder: Secondary | ICD-10-CM | POA: Diagnosis not present

## 2023-04-06 DIAGNOSIS — M25512 Pain in left shoulder: Secondary | ICD-10-CM | POA: Diagnosis not present

## 2023-04-06 DIAGNOSIS — G8929 Other chronic pain: Secondary | ICD-10-CM | POA: Diagnosis not present

## 2023-04-09 ENCOUNTER — Ambulatory Visit: Payer: Medicare HMO

## 2023-04-09 ENCOUNTER — Ambulatory Visit: Payer: Self-pay | Admitting: Licensed Clinical Social Worker

## 2023-04-09 DIAGNOSIS — R296 Repeated falls: Secondary | ICD-10-CM

## 2023-04-09 DIAGNOSIS — M5459 Other low back pain: Secondary | ICD-10-CM | POA: Diagnosis not present

## 2023-04-09 DIAGNOSIS — M25561 Pain in right knee: Secondary | ICD-10-CM | POA: Diagnosis not present

## 2023-04-09 DIAGNOSIS — R2681 Unsteadiness on feet: Secondary | ICD-10-CM | POA: Diagnosis not present

## 2023-04-09 DIAGNOSIS — G8929 Other chronic pain: Secondary | ICD-10-CM | POA: Diagnosis not present

## 2023-04-09 DIAGNOSIS — R2689 Other abnormalities of gait and mobility: Secondary | ICD-10-CM

## 2023-04-09 DIAGNOSIS — M6281 Muscle weakness (generalized): Secondary | ICD-10-CM | POA: Diagnosis not present

## 2023-04-09 DIAGNOSIS — M25562 Pain in left knee: Secondary | ICD-10-CM | POA: Diagnosis not present

## 2023-04-09 NOTE — Patient Instructions (Signed)
Visit Information  Thank you for taking time to visit with me today. Please don't hesitate to contact me if I can be of assistance to you.   Following are the goals we discussed today:   Goals Addressed             This Visit's Progress    Patient is recovering from back surgery, He uses a cane or a walker as needed to help him walk       Interventions:  Spoke with client via phone today about his current status.  Spoke with client about needs Discussed numbness in hands. He said he sometimes drops mail when carrying mail. Spoke of physical therapy sessions of client. He is getting outpatient physical therapy 2 times weekly as scheduled Provided counseling support for client Discussed client ambulation. He uses a cane or a walker as needed to help him walk Discussed medication procurement. He said he is receiving medications as needed Discussed mood of client. He feels that his mood is stable. Client did not mention any mood issues. Client likes to watch TV. Client likes to go out to eat occasionally. Client likes to spend time on computer. Client did not mention any mood issues Discussed family support with wife. Discussed transport needs. Client said he is still driving Reviewed program support for client with RN, Pharmacist and LCSW Discussed hand movements. Discussed utensil use. Discussed ability to hold glass of liquid. Client said he has numbness occasionally in his hands. Client spoke of challenges in holding items with his hands Discussed sleeping issues. He said he is sleeping well Client likes to go out to eat occasionally to socialize with other Discussed support of client with his medical providers Discussed exercises of client. He likes to exercise by going up and down steps in his home. Client does stretching exercises Encouraged client to call LCSW as needed at [phone].          Our next appointment is by telephone on 06/04/23 at 10:00 AM   Please call the  care guide team at [phone] if you need to cancel or reschedule your appointment.   If you are experiencing a Mental Health or Behavioral Health Crisis or need someone to talk to, please go to [facility] [address] [place] [phone])   The patient verbalized understanding of instructions, educational materials, and care plan provided today and DECLINED offer to receive copy of patient instructions, educational materials, and care plan.   The patient has been provided with contact information for the care management team and has been advised to call with any health related questions or concerns.   Kelton Pillar.Jacqulin Brandenburger MSW, LCSW Licensed Visual merchandiser [facility] Care Management [phone]

## 2023-04-09 NOTE — Patient Outreach (Signed)
Care Coordination   Follow Up Visit Note   04/09/2023 Name: OAKLEN GALLIHUGH MRN: [id number] DOB: 24-Dec-1940  PACE POITIER is a 82 y.o. year old male who sees [name], NP for primary care. I spoke with  [name] by phone today.  What matters to the patients health and wellness today?   Patient is recovering from back surgery. He is getting outpatient PT 2 times weekly . He uses a cane or walker as needed to help him walk    Goals Addressed             This Visit's Progress    Patient is recovering from back surgery, He uses a cane or a walker as needed to help him walk       Interventions:  Spoke with client via phone today about his current status.  Spoke with client about needs Discussed numbness in hands. He said he sometimes drops mail when carrying mail. Spoke of physical therapy sessions of client. He is getting outpatient physical therapy 2 times weekly as scheduled Provided counseling support for client Discussed client ambulation. He uses a cane or a walker as needed to help him walk Discussed medication procurement. He said he is receiving medications as needed Discussed mood of client. He feels that his mood is stable. Client did not mention any mood issues. Client likes to watch TV. Client likes to go out to eat occasionally. Client likes to spend time on computer. Client did not mention any mood issues Discussed family support with wife. Discussed transport needs. Client said he is still driving Reviewed program support for client with RN, Pharmacist and LCSW Discussed hand movements. Discussed utensil use. Discussed ability to hold glass of liquid. Client said he has numbness occasionally in his hands. Client spoke of challenges in holding items with his hands Discussed sleeping issues. He said he is sleeping well Client likes to go out to eat occasionally to socialize with other Discussed support of client with his medical  providers Discussed exercises of client. He likes to exercise by going up and down steps in his home. Client does stretching exercises Encouraged client to call LCSW as needed at [phone].          SDOH assessments and interventions completed:  Yes  SDOH Interventions Today    Flowsheet Row Most Recent Value  SDOH Interventions   Physical Activity Interventions Other (Comments)  [uses cane or a walker to help him walk as needed. Receiving outpatient PT support]  Stress Interventions Other (Comment)  [client has stress related to managing medical needs]        Care Coordination Interventions:  Yes, provided   Interventions Today    Flowsheet Row Most Recent Value  Chronic Disease   Chronic disease during today's visit Other  [spoke with client about client needs]  General Interventions   General Interventions Discussed/Reviewed General Interventions Discussed, [employer] program support with RN,LCSW, Pharmacist]  Exercise Interventions   Exercise Discussed/Reviewed Physical Activity  [tries to walk up and down steps at home for exercise]  Physical Activity Discussed/Reviewed Physical Activity Discussed  Education Interventions   Education Provided Provided Education  Provided Verbal Education On Community Resources  Mental Health Interventions   Mental Health Discussed/Reviewed Coping Strategies, Anxiety  [discussed mood. client did not mention any mood issues]  Nutrition Interventions   Nutrition Discussed/Reviewed Nutrition Discussed  Pharmacy Interventions   Pharmacy Dicussed/Reviewed Pharmacy Topics Discussed  Safety Interventions   Safety Discussed/Reviewed  Fall Risk       Follow up plan: Follow up call scheduled for 06/04/23 at 10:00 AM     Encounter Outcome:  Pt. Visit Completed   Kelton Pillar.Duward Allbritton MSW, LCSW Licensed Visual merchandiser [facility] Care Management [phone]

## 2023-04-09 NOTE — Therapy (Signed)
OUTPATIENT PHYSICAL THERAPY TREATMENT   Patient Name: [name] Pratt MRN: [id number] DOB:June 24, 1941, 82 y.o., male Today's Date: 04/09/2023   END OF SESSION:  PT End of Session - 04/09/23 1318     Visit Number 7    Date for PT Re-Evaluation 05/08/23    Authorization Type [name]    Authorization Time Period 03/13/23 - 05/08/23    Authorization - Visit Number 6    Authorization - Number of Visits 10    Progress Note Due on Visit 10    PT Start Time 1315    PT Stop Time 1400    PT Time Calculation (min) 45 min    Activity Tolerance Patient tolerated treatment well    Behavior During Therapy [facility] for tasks assessed/performed                 Past Medical History:  Diagnosis Date   Allergic rhinitis 02/02/2016   Anemia 12/17/2009   Formatting of this note might be different from the original. 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 Hypertension   Benign paroxysmal positional vertigo 09/14/2021   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HCC) 07/22/2021   Body mass index (BMI) 25.0-25.9, adult 11/02/2020   Cancer (HCC)    skin cancer   Cardiac murmur 07/25/2021   Carotid stenosis    a. Carotid U/S 5/13: LICA < 50%, RICA 50-69%;  b.  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   Cystoid macular edema of both eyes 07/28/2020   Degenerative disc disease,  lumbar 06/30/2015   Deviated nasal septum 02/18/2015   Diverticulosis 03/03/2020   DJD (degenerative joint disease) of hip 12/24/2012   Dyspnea 06/12/2011   Emphysema, unspecified (HCC) 03/05/2020   Erectile dysfunction 06/20/2017   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   Gait disorder 03/02/2021   GERD (gastroesophageal reflux disease)    GOUT 05/13/2009   Qualifier: Diagnosis of  By: Kem Parkinson     Greater trochanteric pain syndrome 07/22/2021   Hand pain, left 01/16/2022   Hearing loss 02/02/2016   Heme positive stool 02/29/2020   HIATAL HERNIA 05/13/2009   Qualifier: Diagnosis of  By: Kem Parkinson     High risk medication use 01/18/2012   Hip pain 03/02/2021   History of chicken pox    History of COVID-19 12/05/2021   History of hemorrhoids    History of kidney stones    History of skin cancer 07/22/2021   skin cancer   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 Cardiology - Dr Estrella Myrtle at [facility] cardiology  ICD-10 cut over   Hypertension    under control   Hypokalemia 07/08/2021   Hypothyroidism  Internal hemorrhoids 03/03/2020   Leg cramps 11/13/2019   Lower GI bleed 12/12/2012   Lumbago of lumbar region with sciatica 10/21/2020   Lumbar radiculopathy 03/02/2021   Lumbar spondylosis 11/02/2020   NEPHROLITHIASIS 05/13/2009   Qualifier: Diagnosis of  By: Kem Parkinson     Nonspecific abnormal electrocardiogram (ECG) (EKG) 01/06/2013   Numbness of hand 01/24/2022   Onychomycosis 06/30/2015   Osteoarthrosis, 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 Osteoarthritis Of The Hand  10/1 IMO update   Osteoarthrosis, unspecified whether generalized or localized, pelvic region and thigh  07/25/2010   Formatting of this note might be different from the original. Osteoarthritis Of The Hip   Other abnormal glucose 07/22/2021   Other allergic rhinitis 02/18/2015   Palpitations 07/25/2021   Peripheral neuropathy 06/19/2016   Preventative health care 11/24/2015   Right groin pain 11/29/2012   Right inguinal hernia 05/13/2009   Qualifier: Diagnosis of  By: Kem Parkinson     RLS (restless legs syndrome) 02/18/2015   Rosacea    Rotator cuff tear 07/27/2013   Situational depression 03/02/2021   Skin lesion 01/16/2022   Sleep apnea with use of continuous positive airway pressure (CPAP) 07/15/2013   CPAP set to  7 cm water,  Residual AHi was 7.8  With no leak.  User time 6 hours.  479 days , not used only 12 days - highly compliant 06-23-13  .    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 Fawn Kirk   Ulnar neuropathy 01/24/2022   Urinary retention 11/26/2020   Ventral hernia 07/08/2012   Vertigo 07/08/2021   Weight loss 03/03/2020   Past Surgical History:  Procedure Laterality Date   ABDOMINAL HERNIA REPAIR  07/15/12   Dr Ashley Jacobs   ANTERIOR CERVICAL DECOMP/DISCECTOMY FUSION N/A 07/09/2020   Procedure: ANTERIOR CERVICAL DECOMPRESSION AND FUSION CERVICAL THREE-FOUR.;  Surgeon: Donalee Citrin, MD;  Location: [facility] OR;  Service: Neurosurgery;  Laterality: N/A;  anterior   BROW LIFT  05/07/01   COLONOSCOPY WITH PROPOFOL N/A 04/24/2022   Procedure: COLONOSCOPY WITH PROPOFOL;  Surgeon: Tressia Danas, MD;  Location: [facility] ENDOSCOPY;  Service: Gastroenterology;  Laterality: N/A;   COLONOSCOPY WITH PROPOFOL N/A 04/26/2022   Procedure: COLONOSCOPY WITH PROPOFOL;  Surgeon: Tressia Danas, MD;  Location: [facility]  ENDOSCOPY;  Service: Gastroenterology;  Laterality: N/A;   CYSTOSCOPY WITH URETEROSCOPY AND STENT PLACEMENT Right 01/28/2014   Procedure: CYSTOSCOPY WITH URETEROSCOPY, BASKET RETRIVAL AND  STENT PLACEMENT;  Surgeon: Valetta Fuller, MD;  Location: [facility] ORS;  Service: Urology;  Laterality: Right;   epidural injections     multiple procedures   ESOPHAGEAL DILATION  1993 and 1994   multiple times   EYE SURGERY Bilateral 03/25/10, 2012   cataract removal   HEMORRHOID SURGERY  2014   HIATAL HERNIA REPAIR  12/21/93   HOLMIUM LASER APPLICATION Right 01/28/2014   Procedure: HOLMIUM LASER APPLICATION;  Surgeon: Valetta Fuller, MD;  Location: [facility] ORS;  Service: Urology;  Laterality: Right;   INGUINAL HERNIA REPAIR Right 07/15/12   Dr Ashley Jacobs, x2   JOINT REPLACEMENT  07/25/04   right knee   JOINT REPLACEMENT  07/13/10   left knee   KNEE SURGERY  1995  LAPAROSCOPIC CHOLECYSTECTOMY  09/20/06   with intraoperative cholangiogram and right inguinal herniorrhaphy with mesh    LIGAMENT REPAIR Left 07/2013   shoulder   LITHOTRIPSY     x2   LUMBAR LAMINECTOMY/DECOMPRESSION MICRODISCECTOMY Bilateral 12/27/2022   Procedure: Lumbar Two-Three, Lumbar Three-Four Sublaminar Decompression;  Surgeon: Donalee Citrin, MD;  Location: [facility] OR;  Service: Neurosurgery;  Laterality: Bilateral;   NASAL SEPTUM SURGERY  1975   POLYPECTOMY  04/26/2022   Procedure: POLYPECTOMY;  Surgeon: Tressia Danas, MD;  Location: [facility] ENDOSCOPY;  Service: Gastroenterology;;   POSTERIOR CERVICAL FUSION/FORAMINOTOMY N/A 11/24/2020   Procedure: Posterior Cervical Laminectomy Cervical three-four, Cervical four-five with lateral mass fusion;  Surgeon: Donalee Citrin, MD;  Location: [facility] OR;  Service: Neurosurgery;  Laterality: N/A;   REFRACTIVE SURGERY Bilateral    Right total hip replacement  4/14   SKIN CANCER DESTRUCTION     nose, ear   TONSILLECTOMY  as child   TOTAL HIP ARTHROPLASTY Left    URETHRAL DILATION  1991   Dr. Annabell Howells   Patient Active Problem  List   Diagnosis Date Noted   Spinal stenosis of lumbar region 12/27/2022   Drowsiness 12/07/2022   Preop examination 12/06/2022   Weakness of lower extremity 10/10/2022   Skin tear of right elbow without complication 09/18/2022   Skin tear of left lower leg without complication 09/18/2022   Fall 09/18/2022   Iron deficiency anemia 09/18/2022   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   Vitamin D deficiency 08/07/2022   Closed fracture of left hip (HCC) 08/07/2022   Hand weakness 05/02/2022   Acute kidney injury (HCC) 05/02/2022   Adenomatous polyp of ascending colon    Adenomatous polyp of colon    Acute metabolic encephalopathy 04/24/2022   GI bleed 04/21/2022   Vasovagal syncope    Hemorrhagic shock (HCC)    Hematochezia    Irregular heart beat 02/03/2022   Constipation 02/03/2022   Carpal tunnel syndrome 01/24/2022   Numbness of hand 01/24/2022   Ulnar neuropathy 01/24/2022   Skin lesion 01/16/2022   History of COVID-19 12/05/2021   Benign paroxysmal positional vertigo 09/14/2021   Palpitations 07/25/2021   Cardiac murmur 07/25/2021   History of chicken pox 07/22/2021   History of hemorrhoids 07/22/2021   Hypertension 07/22/2021   COPD (chronic obstructive pulmonary disease) (HCC) 07/22/2021   History of kidney stones 07/22/2021   History of skin cancer 07/22/2021   Complication of anesthesia 07/22/2021   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HCC) 07/22/2021   Other abnormal glucose 07/22/2021   Spondylitis (HCC) 07/22/2021   Greater trochanteric pain syndrome 07/22/2021   Vertigo 07/08/2021   Hypokalemia 07/08/2021   Trochanteric bursitis of left hip 06/30/2021   Cancer (HCC) 04/11/2021   Lumbar radiculopathy 03/02/2021   Gait instability 03/02/2021   Hip pain 03/02/2021   Situational depression 03/02/2021   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   Urinary retention 11/26/2020   Cervical myelopathy  (HCC) 11/24/2020   Body mass index (BMI) 25.0-25.9, adult 11/02/2020   Lumbar spondylosis 11/02/2020   Lumbago of lumbar region with sciatica 10/21/2020   Cystoid macular edema of both eyes 07/28/2020   Status post cervical spinal fusion 07/09/2020   History of total bilateral knee replacement 07/08/2020   Aortic atherosclerosis (HCC) 03/05/2020   Emphysema, unspecified (HCC) 03/05/2020   Arthritis 03/04/2020   Diverticulosis 03/03/2020   Internal hemorrhoids 03/03/2020   Weight loss 03/03/2020   Heme positive stool 02/29/2020   Leg cramps  11/13/2019   Tibialis anterior tendon tear, nontraumatic 11/13/2019   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 07/29/2018   CPAP (continuous positive airway pressure) dependence 03/25/2018   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   Erectile dysfunction 06/20/2017   Stenosis of right carotid artery without cerebral infarction 03/06/2017   Peripheral neuropathy 06/19/2016   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   Hearing loss 02/02/2016   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   Allergic rhinitis 02/02/2016   Atypical chest pain 11/24/2015   Preventative health care 11/24/2015   Onychomycosis 06/30/2015   Degenerative disc disease, lumbar 06/30/2015   Other allergic rhinitis 02/18/2015   Deviated nasal septum 02/18/2015   RLS (restless legs syndrome) 02/18/2015   GERD (gastroesophageal reflux disease) 09/18/2014   Hyperglycemia 03/03/2014   Rotator cuff tear 07/27/2013   Sleep apnea with use of continuous positive airway pressure (CPAP) 07/15/2013   Carotid stenosis 02/24/2013   Nonspecific abnormal electrocardiogram (ECG) (EKG) 01/06/2013   DJD (degenerative joint disease) of hip 12/24/2012   Lower GI bleed 12/12/2012   Right groin pain 11/29/2012   OSA (obstructive sleep apnea) 11/25/2012   Ventral hernia 07/08/2012   High risk medication use 01/18/2012   Dyspnea 06/12/2011   Hyperlipidemia 12/27/2010   Osteoarthrosis,  unspecified whether generalized or localized, pelvic region and thigh 07/25/2010   Osteoarthrosis, hand 06/13/2010   Anemia 12/17/2009   Hypothyroidism 05/13/2009   GOUT 05/13/2009   Benign essential hypertension 05/13/2009   Right inguinal hernia 05/13/2009   HIATAL HERNIA 05/13/2009   NEPHROLITHIASIS 05/13/2009   ROSACEA 05/13/2009    PCP: [name], NP   REFERRING PROVIDER: Susa Raring., MD   REFERRING DIAG:  M25.562, G89.29 (ICD-10-CM) - Chronic pain in left knee  M25.561, G89.29 (ICD-10-CM) - Chronic pain in right knee   THERAPY DIAG:  Chronic pain of both knees  Muscle weakness (generalized)  Unsteadiness on feet  Other abnormalities of gait and mobility  Repeated falls  Other low back pain  RATIONALE FOR EVALUATION AND TREATMENT: Rehabilitation  ONSET DATE: off and on for a couple years  NEXT MD VISIT: 04/06/23   SUBJECTIVE:                                                                                                                                                                                                         SUBJECTIVE STATEMENT: Pt reports that he was hurting in both knees early this morning, no pain now. He is having some low back pain right now.  PAIN: Are you having pain? Yes:  NPRS scale: 0/10 Pain location: B anterior knee pain with subluxation Pain description: brief sharp, aching Aggravating factors: patellar subluxation, prolonged standing Relieving factors: rubbing his knees with Icy/Hot  Are you having pain? Yes: NPRS scale: 0/10 currently, up to 5/10 Pain location: across low back Pain description: aching, pressure Aggravating factors: getting out of bed Relieving factors: nothing   PERTINENT HISTORY:  L2-3, L3-4 Sublaminar Decompression on 12/27/22; Revision of femoral component of L THA on 07/02/22 s/p fall with periprosthetic fracture; Aortic atherosclerosis; carotid stenosis; cervical myelopathy s/p ACDF  06/2020 and posterior cervical fusion 11/2020; peripheral neuropathy; lumbar DDD/spondylosis; lumbago with sciatica/lumbar radiculopathy; OA; B TKA; B THA; L greater trochanteric pain syndrome; L RCR 2014; CTR; hearing loss; HTN; BPPV/vertigo; B sacroiliitis; skin cancer; COPD/emphysema; sleep apnea with CPAP; GERD; gout; HLD; LE cramps; RLS   PRECAUTIONS: Fall  WEIGHT BEARING RESTRICTIONS: No  FALLS:  Has patient fallen in last 6 months? Yes. 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  No fracture or bony lesion or other findings.  Impression: Right knee replacement in good condition.   01/31/23 - L knee x-ray: No fracture or bony lesion or arthritic change or other abnormality.  Left  knee replacement looks well-fixed and in excellent position.  Impression: Knee replacement in good condition.   PATIENT SURVEYS:  LEFS 31 / 80 = 38.8 %  COGNITION: Overall cognitive status: Within functional limits for tasks assessed and History of cognitive impairments - at baseline    SENSATION: Intermittent peripheral neuropathy in B  feet  MUSCLE LENGTH: TBA Hamstrings:  ITB:  Piriformis:  Hip flexors:  Quads:  Heelcord:   POSTURE:  rounded shoulders, forward head, and flexed trunk   LOWER EXTREMITY ROM: Limited B hip and ankle ROM Active ROM Right eval Left eval Right 04/03/23 Left 04/03/23  Knee flexion 104 96 110 108  Knee extension -10 -5 4 2    Passive ROM Right eval Left eval  Knee flexion    Knee extension 0 0  (Blank rows = not tested)  LOWER EXTREMITY MMT:  MMT Right Eval* Left Eval*  Hip flexion 3+ 4  Hip extension 3- 3+  Hip abduction 4- 4  Hip adduction 4 4  Hip internal rotation 3+ 4-  Hip external rotation 2+ 3-  Knee flexion 4 4  Knee extension 4 4+  Ankle dorsiflexion 4+ 4  Ankle plantarflexion    Ankle inversion    Ankle eversion     (Blank rows = not tested, *tested in sitting)  FUNCTIONAL TESTS: (Remaining tests to be assessed next visit with PT)  5 times sit to stand: 18.94 sec w/o UE assist (uncontrolled descent on most reps) (03/22/23) Timed up and go (TUG): 21.29 sec with RW (03/22/23) 10 meter walk test: 21.13 sec with RW; Gait speed = 1.55 ft/sec  Berg Balance Scale: 35/56; <36 = High risk for falls (close to 100%) (03/22/23) Dynamic Gait Index: 11/24; Scores of 19 or less are predictive of falls in older community living adults (03/22/23)  GAIT: Distance walked: 60 ft Assistive device utilized: Environmental consultant - 2 wheeled Level of assistance: SBA Gait pattern: step through pattern, decreased stride length, decreased hip/knee flexion- Right, decreased hip/knee flexion- Left, decreased ankle dorsiflexion- Right, decreased ankle dorsiflexion- Left, trunk flexed, poor foot clearance- Right, and poor foot clearance- Left Comments: Cues necessary for improved upright posture and rolling walker proximity   TODAY'S TREATMENT:  04/09/23 THERAPEUTIC EXERCISE: to improve flexibility, strength and mobility.  Demonstration, verbal and tactile cues throughout for technique. Nustep  L5x46min Gait 2 laps around 90 ft Clock balance bil 4x 1/2 way around Seated LAQ 2# 2 x 10 bil Seated march 2# 2x10 Standing side step and back x 10  Standing posterior step and back x 10 Runner stretch  04/03/23 THERAPEUTIC EXERCISE: to improve flexibility, strength and mobility.  Demonstration, verbal and tactile cues throughout for technique. Nustep L5x62min Supine pelvic tilts x 10 supine clams GTB 2 x 10  Supine march GTB 2 x 10 Supine SKTC x 30 sec bil Seated LAQ GTB 2x10  03/29/23 THERAPEUTIC EXERCISE: to improve flexibility, strength and mobility.  Demonstration, verbal and tactile cues throughout for technique.  Rec Bike - L1 x 6 min R long sitting quad set/TKE into small ball 10 x 5" Seated hip ADD isometric ball squeeze + R/L LAQ x 10 Hooklying hip ADD isometric ball squeeze + R/L SAQ over 8" FR 2 x 10 Bridge x 10 Hooklying RTB alt bent-knee fallout 2 x 10 Bridge + RTB hip ABD isometric x 10 Bridge + hip ADD ball squeeze x 10   03/26/23 THERAPEUTIC EXERCISE: to improve flexibility, strength and mobility.  Demonstration, verbal and tactile cues throughout for technique.  NuStep L5 x 6 min Sit to stands 2x10 with elevated mat (airex pad under bottom) Seated march no UE support x 10 bil Side steps from end to end of 3x with SPC, CGA Clock balance 5x bil with rollator Seated trunk rotation with yellow wt ball x 10    03/22/23 THERAPEUTIC EXERCISE: to improve flexibility, strength and mobility.  Demonstration, verbal and tactile cues throughout for technique.  NuStep L4 x 6 min  THERAPEUTIC ACTIVITIES: 5xSTS: 18.94 sec w/o UE assist (uncontrolled descent on most reps) TUG: 21.29 sec with RW Berg:  35/56; <36 = High risk for falls (close to 100%)  DGI: 11/24; Scores of 19 or less are predictive of falls in older community living adults   GAIT TRAINING: To normalize gait pattern and improve safety with RW/FWW . Gait 200 feet with RW/FWW - cues for upright posture and  improved walker proximity - patient demonstrating better foot clearance during swing through with good heel strike on weight acceptance but still noted to catch right toes on occasion Stairs: Level of Assistance: Min A Stair Negotiation Technique: Step to Pattern Alternating Pattern  with Single Rail on Right Number of Stairs: 14  Height of Stairs: 7"  Comments: Patient attempting stairs with reciprocal alternating pattern however having difficulty clearing toes on stair ascent and increased instability with limited eccentric control noted on stair descent -  patient encouraged to use step to pattern as needed for safety if needing to navigate stairs in place of alternating pattern.   PATIENT EDUCATION:  Education details: HEP progression - LAQ and runner stretch Person educated: Patient Education method: Explanation, Demonstration, Verbal cues, and Handouts Education comprehension: verbalized understanding, returned demonstration, verbal cues required, and needs further education  HOME EXERCISE PROGRAM: Access Code: [email or web address] URL: [email or web address] Date: 03/29/2023 Prepared by: Glenetta Hew  Exercises - Seated Hamstring Stretch with Strap  - 1 x daily - 7 x weekly - 3 sets - 30 sec hold - Seated Quad Set  - 1 x daily - 7 x weekly - 3 sets - 10 reps - 5 seconds hold - Seated Hip Adduction Squeeze with Ball  - 1 x daily - 7 x weekly - 3 sets - 10 reps - 5 secs hold - Supine Quad Set on Towel Roll  - 1 x daily - 7 x weekly - 2 sets - 10 reps - 3 sec hold - Hooklying Single Leg Bent Knee Fallouts with Resistance  - 1 x daily - 7 x weekly - 2 sets - 10 reps - 3 sec hold - Supine Bridge with Resistance Band  - 1 x daily - 7 x weekly - 2 sets - 10 reps - 5 sec hold   ASSESSMENT:  CLINICAL IMPRESSION: Pt continues to be fatigued with little exercise. Cues with exercises as needed to correct form and to isolate the correct muscles. Updated HEP with quad strengthening and  anterior hip stretching to address pain he noted he is beginning to have. Otheriwse no complains and good response to session [name] will continue to benefit from skilled PT to address ongoing flexibility strength and balance deficits to improve mobility and activity tolerance with decreased pain interference and decreased risk for falls.   OBJECTIVE IMPAIRMENTS: Abnormal gait, decreased activity tolerance, decreased balance, decreased coordination, decreased endurance, decreased knowledge of condition, decreased knowledge of use of DME, decreased mobility, difficulty walking, decreased ROM, decreased strength, decreased safety awareness, increased fascial restrictions, impaired perceived functional ability, increased muscle spasms, impaired flexibility, impaired sensation, improper body mechanics, postural dysfunction, and pain.   ACTIVITY LIMITATIONS: carrying, lifting, bending, sitting, standing, squatting, sleeping, stairs, transfers, bed mobility, bathing, locomotion level, and caring for others  PARTICIPATION LIMITATIONS: meal prep, cleaning, laundry, driving, shopping, and community activity  PERSONAL FACTORS: Age, Behavior pattern, Fitness, Past/current experiences, Time since onset of injury/illness/exacerbation, and 3+ comorbidities: L2-3, L3-4 Sublaminar Decompression on 12/27/22; Revision of femoral component of L THA on 07/02/22 s/p fall with periprosthetic fracture; Aortic atherosclerosis; carotid stenosis; 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   REHAB POTENTIAL: Good  CLINICAL DECISION MAKING: Evolving/moderate  complexity  EVALUATION COMPLEXITY: Moderate   GOALS: Goals reviewed with patient? Yes  SHORT TERM GOALS: Target date: 04/10/2023  Patient will be independent with initial HEP. Baseline:  Goal status: IN PROGRESS  2.  Complete balance testing and establish additional STG's/LTG's as appropriate. Baseline:  Goal status: MET  03/22/23  3.  Patient will improve 5x STS time to </= 16 seconds with good eccentric control for improved efficiency and safety with transfers Baseline: 18.94 sec w/o UE assist (uncontrolled descent  on most reps) Goal status: IN PROGRESS   4.  Patient will demonstrate decreased TUG time to </= 18 sec to decrease risk for falls with transitional mobility Baseline: 21.29 sec with RW Goal status: IN PROGRESS   LONG TERM GOALS: Target date: 05/08/2023  Patient will be independent with advanced/ongoing HEP to improve outcomes and carryover.  Baseline:  Goal status: IN PROGRESS  2.  Patient will report at least 50-75% improvement in B knee pain with no further instances of subluxation to improve QOL. Baseline: 5/10 Goal status: IN PROGRESS  3.  Patient will demonstrate improved B knee extension AROM to [facility] to allow for improved stability for normal gait and stair mechanics. Baseline: Refer to above LE ROM table Goal status: IN PROGRESS- checked, see chart 04/03/23  4.  Patient will demonstrate improved B LE strength to >/= 4 to 4+/5 for improved stability and ease of mobility. Baseline: Refer to above LE MMT table Goal status: IN PROGRESS  5.  Patient will be able to ambulate 400' with LRAD and normal gait pattern without increased pain to access community.  Baseline:  Goal status: IN PROGRESS  6. Patient will be able to ascend/descend stairs with 1 HR and reciprocal step pattern safely to access home and community.  Baseline:  Goal status: IN PROGRESS  7.  Patient will report >/= 40/80 on LEFS to demonstrate improved functional ability. Baseline: 31 / 80  = 38.8 % Goal status: IN PROGRESS  8.  Patient will demonstrate at least 19/24 on DGI to decrease risk of falls. Baseline: 11/24 (03/22/23) Goal status: IN PROGRESS   9.  Patient will improve Berg score by at least 8 points to improve safety and stability with ADLs in standing and reduce risk for falls. Baseline: 35/56 (03/22/23) Goal status: IN PROGRESS   PLAN:  PT FREQUENCY: 2x/week  PT DURATION: 8 weeks  PLANNED INTERVENTIONS: Therapeutic exercises, Therapeutic activity, Neuromuscular re-education, Balance training, Gait training, Patient/Family education, Self Care, Joint mobilization, Stair training, DME instructions, Aquatic Therapy, Dry Needling, Electrical stimulation, Spinal mobilization, Cryotherapy, Moist heat, Taping, Ultrasound, and Manual therapy  PLAN FOR NEXT SESSION: Assess response to home performance of HEP update; L knee ROM; B LE flexibility and strengthening; balance training; review and progress HEP as indicated   Darleene Cleaver, PTA 04/09/2023, 2:22 PM

## 2023-04-17 ENCOUNTER — Ambulatory Visit: Payer: Medicare HMO | Attending: Sports Medicine

## 2023-04-17 DIAGNOSIS — R2681 Unsteadiness on feet: Secondary | ICD-10-CM | POA: Insufficient documentation

## 2023-04-17 DIAGNOSIS — R296 Repeated falls: Secondary | ICD-10-CM | POA: Diagnosis not present

## 2023-04-17 DIAGNOSIS — M25561 Pain in right knee: Secondary | ICD-10-CM | POA: Insufficient documentation

## 2023-04-17 DIAGNOSIS — M6281 Muscle weakness (generalized): Secondary | ICD-10-CM | POA: Diagnosis not present

## 2023-04-17 DIAGNOSIS — G8929 Other chronic pain: Secondary | ICD-10-CM | POA: Insufficient documentation

## 2023-04-17 DIAGNOSIS — R2689 Other abnormalities of gait and mobility: Secondary | ICD-10-CM | POA: Insufficient documentation

## 2023-04-17 DIAGNOSIS — M25562 Pain in left knee: Secondary | ICD-10-CM | POA: Diagnosis not present

## 2023-04-17 DIAGNOSIS — M5459 Other low back pain: Secondary | ICD-10-CM | POA: Insufficient documentation

## 2023-04-17 NOTE — Therapy (Signed)
OUTPATIENT PHYSICAL THERAPY TREATMENT   Patient Name: [name] Pratt MRN: [id number] DOB:1941/03/12, 82 y.o., male Today's Date: 04/17/2023   END OF SESSION:  PT End of Session - 04/17/23 1628     Visit Number 8    Date for PT Re-Evaluation 05/08/23    Authorization Type [name]    Authorization Time Period 03/13/23 - 05/08/23    Authorization - Visit Number 7    Authorization - Number of Visits 10    Progress Note Due on Visit 10    PT Start Time 1536    PT Stop Time 1619    PT Time Calculation (min) 43 min    Activity Tolerance Patient tolerated treatment well    Behavior During Therapy [facility] for tasks assessed/performed                  Past Medical History:  Diagnosis Date   Allergic rhinitis 02/02/2016   Anemia 12/17/2009   Formatting of this note might be different from the original. 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 Hypertension   Benign paroxysmal positional vertigo 09/14/2021   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HCC) 07/22/2021   Body mass index (BMI) 25.0-25.9, adult 11/02/2020   Cancer (HCC)    skin cancer   Cardiac murmur 07/25/2021   Carotid stenosis    a. Carotid U/S 5/13: LICA < 50%, RICA 50-69%;  b.  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   Cystoid macular edema of both eyes 07/28/2020   Degenerative disc disease,  lumbar 06/30/2015   Deviated nasal septum 02/18/2015   Diverticulosis 03/03/2020   DJD (degenerative joint disease) of hip 12/24/2012   Dyspnea 06/12/2011   Emphysema, unspecified (HCC) 03/05/2020   Erectile dysfunction 06/20/2017   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   Gait disorder 03/02/2021   GERD (gastroesophageal reflux disease)    GOUT 05/13/2009   Qualifier: Diagnosis of  By: Kem Parkinson     Greater trochanteric pain syndrome 07/22/2021   Hand pain, left 01/16/2022   Hearing loss 02/02/2016   Heme positive stool 02/29/2020   HIATAL HERNIA 05/13/2009   Qualifier: Diagnosis of  By: Kem Parkinson     High risk medication use 01/18/2012   Hip pain 03/02/2021   History of chicken pox    History of COVID-19 12/05/2021   History of hemorrhoids    History of kidney stones    History of skin cancer 07/22/2021   skin cancer   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 Cardiology - Dr Estrella Myrtle at [facility] cardiology  ICD-10 cut over   Hypertension    under control   Hypokalemia 07/08/2021   Hypothyroidism  Internal hemorrhoids 03/03/2020   Leg cramps 11/13/2019   Lower GI bleed 12/12/2012   Lumbago of lumbar region with sciatica 10/21/2020   Lumbar radiculopathy 03/02/2021   Lumbar spondylosis 11/02/2020   NEPHROLITHIASIS 05/13/2009   Qualifier: Diagnosis of  By: Kem Parkinson     Nonspecific abnormal electrocardiogram (ECG) (EKG) 01/06/2013   Numbness of hand 01/24/2022   Onychomycosis 06/30/2015   Osteoarthrosis, 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 Osteoarthritis Of The Hand  10/1 IMO update   Osteoarthrosis, unspecified whether generalized or localized, pelvic region and thigh  07/25/2010   Formatting of this note might be different from the original. Osteoarthritis Of The Hip   Other abnormal glucose 07/22/2021   Other allergic rhinitis 02/18/2015   Palpitations 07/25/2021   Peripheral neuropathy 06/19/2016   Preventative health care 11/24/2015   Right groin pain 11/29/2012   Right inguinal hernia 05/13/2009   Qualifier: Diagnosis of  By: Kem Parkinson     RLS (restless legs syndrome) 02/18/2015   Rosacea    Rotator cuff tear 07/27/2013   Situational depression 03/02/2021   Skin lesion 01/16/2022   Sleep apnea with use of continuous positive airway pressure (CPAP) 07/15/2013   CPAP set to  7 cm water,  Residual AHi was 7.8  With no leak.  User time 6 hours.  479 days , not used only 12 days - highly compliant 06-23-13  .    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 Fawn Kirk   Ulnar neuropathy 01/24/2022   Urinary retention 11/26/2020   Ventral hernia 07/08/2012   Vertigo 07/08/2021   Weight loss 03/03/2020   Past Surgical History:  Procedure Laterality Date   ABDOMINAL HERNIA REPAIR  07/15/12   Dr Ashley Jacobs   ANTERIOR CERVICAL DECOMP/DISCECTOMY FUSION N/A 07/09/2020   Procedure: ANTERIOR CERVICAL DECOMPRESSION AND FUSION CERVICAL THREE-FOUR.;  Surgeon: Donalee Citrin, MD;  Location: [facility] OR;  Service: Neurosurgery;  Laterality: N/A;  anterior   BROW LIFT  05/07/01   COLONOSCOPY WITH PROPOFOL N/A 04/24/2022   Procedure: COLONOSCOPY WITH PROPOFOL;  Surgeon: Tressia Danas, MD;  Location: [facility] ENDOSCOPY;  Service: Gastroenterology;  Laterality: N/A;   COLONOSCOPY WITH PROPOFOL N/A 04/26/2022   Procedure: COLONOSCOPY WITH PROPOFOL;  Surgeon: Tressia Danas, MD;  Location: [facility]  ENDOSCOPY;  Service: Gastroenterology;  Laterality: N/A;   CYSTOSCOPY WITH URETEROSCOPY AND STENT PLACEMENT Right 01/28/2014   Procedure: CYSTOSCOPY WITH URETEROSCOPY, BASKET RETRIVAL AND  STENT PLACEMENT;  Surgeon: Valetta Fuller, MD;  Location: [facility] ORS;  Service: Urology;  Laterality: Right;   epidural injections     multiple procedures   ESOPHAGEAL DILATION  1993 and 1994   multiple times   EYE SURGERY Bilateral 03/25/10, 2012   cataract removal   HEMORRHOID SURGERY  2014   HIATAL HERNIA REPAIR  12/21/93   HOLMIUM LASER APPLICATION Right 01/28/2014   Procedure: HOLMIUM LASER APPLICATION;  Surgeon: Valetta Fuller, MD;  Location: [facility] ORS;  Service: Urology;  Laterality: Right;   INGUINAL HERNIA REPAIR Right 07/15/12   Dr Ashley Jacobs, x2   JOINT REPLACEMENT  07/25/04   right knee   JOINT REPLACEMENT  07/13/10   left knee   KNEE SURGERY  1995  LAPAROSCOPIC CHOLECYSTECTOMY  09/20/06   with intraoperative cholangiogram and right inguinal herniorrhaphy with mesh    LIGAMENT REPAIR Left 07/2013   shoulder   LITHOTRIPSY     x2   LUMBAR LAMINECTOMY/DECOMPRESSION MICRODISCECTOMY Bilateral 12/27/2022   Procedure: Lumbar Two-Three, Lumbar Three-Four Sublaminar Decompression;  Surgeon: Donalee Citrin, MD;  Location: [facility] OR;  Service: Neurosurgery;  Laterality: Bilateral;   NASAL SEPTUM SURGERY  1975   POLYPECTOMY  04/26/2022   Procedure: POLYPECTOMY;  Surgeon: Tressia Danas, MD;  Location: [facility] ENDOSCOPY;  Service: Gastroenterology;;   POSTERIOR CERVICAL FUSION/FORAMINOTOMY N/A 11/24/2020   Procedure: Posterior Cervical Laminectomy Cervical three-four, Cervical four-five with lateral mass fusion;  Surgeon: Donalee Citrin, MD;  Location: [facility] OR;  Service: Neurosurgery;  Laterality: N/A;   REFRACTIVE SURGERY Bilateral    Right total hip replacement  4/14   SKIN CANCER DESTRUCTION     nose, ear   TONSILLECTOMY  as child   TOTAL HIP ARTHROPLASTY Left    URETHRAL DILATION  1991   Dr. Annabell Howells   Patient Active Problem  List   Diagnosis Date Noted   Spinal stenosis of lumbar region 12/27/2022   Drowsiness 12/07/2022   Preop examination 12/06/2022   Weakness of lower extremity 10/10/2022   Skin tear of right elbow without complication 09/18/2022   Skin tear of left lower leg without complication 09/18/2022   Fall 09/18/2022   Iron deficiency anemia 09/18/2022   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   Vitamin D deficiency 08/07/2022   Closed fracture of left hip (HCC) 08/07/2022   Hand weakness 05/02/2022   Acute kidney injury (HCC) 05/02/2022   Adenomatous polyp of ascending colon    Adenomatous polyp of colon    Acute metabolic encephalopathy 04/24/2022   GI bleed 04/21/2022   Vasovagal syncope    Hemorrhagic shock (HCC)    Hematochezia    Irregular heart beat 02/03/2022   Constipation 02/03/2022   Carpal tunnel syndrome 01/24/2022   Numbness of hand 01/24/2022   Ulnar neuropathy 01/24/2022   Skin lesion 01/16/2022   History of COVID-19 12/05/2021   Benign paroxysmal positional vertigo 09/14/2021   Palpitations 07/25/2021   Cardiac murmur 07/25/2021   History of chicken pox 07/22/2021   History of hemorrhoids 07/22/2021   Hypertension 07/22/2021   COPD (chronic obstructive pulmonary disease) (HCC) 07/22/2021   History of kidney stones 07/22/2021   History of skin cancer 07/22/2021   Complication of anesthesia 07/22/2021   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HCC) 07/22/2021   Other abnormal glucose 07/22/2021   Spondylitis (HCC) 07/22/2021   Greater trochanteric pain syndrome 07/22/2021   Vertigo 07/08/2021   Hypokalemia 07/08/2021   Trochanteric bursitis of left hip 06/30/2021   Cancer (HCC) 04/11/2021   Lumbar radiculopathy 03/02/2021   Gait instability 03/02/2021   Hip pain 03/02/2021   Situational depression 03/02/2021   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   Urinary retention 11/26/2020   Cervical myelopathy  (HCC) 11/24/2020   Body mass index (BMI) 25.0-25.9, adult 11/02/2020   Lumbar spondylosis 11/02/2020   Lumbago of lumbar region with sciatica 10/21/2020   Cystoid macular edema of both eyes 07/28/2020   Status post cervical spinal fusion 07/09/2020   History of total bilateral knee replacement 07/08/2020   Aortic atherosclerosis (HCC) 03/05/2020   Emphysema, unspecified (HCC) 03/05/2020   Arthritis 03/04/2020   Diverticulosis 03/03/2020   Internal hemorrhoids 03/03/2020   Weight loss 03/03/2020   Heme positive stool 02/29/2020   Leg cramps  11/13/2019   Tibialis anterior tendon tear, nontraumatic 11/13/2019   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 07/29/2018   CPAP (continuous positive airway pressure) dependence 03/25/2018   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   Erectile dysfunction 06/20/2017   Stenosis of right carotid artery without cerebral infarction 03/06/2017   Peripheral neuropathy 06/19/2016   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   Hearing loss 02/02/2016   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   Allergic rhinitis 02/02/2016   Atypical chest pain 11/24/2015   Preventative health care 11/24/2015   Onychomycosis 06/30/2015   Degenerative disc disease, lumbar 06/30/2015   Other allergic rhinitis 02/18/2015   Deviated nasal septum 02/18/2015   RLS (restless legs syndrome) 02/18/2015   GERD (gastroesophageal reflux disease) 09/18/2014   Hyperglycemia 03/03/2014   Rotator cuff tear 07/27/2013   Sleep apnea with use of continuous positive airway pressure (CPAP) 07/15/2013   Carotid stenosis 02/24/2013   Nonspecific abnormal electrocardiogram (ECG) (EKG) 01/06/2013   DJD (degenerative joint disease) of hip 12/24/2012   Lower GI bleed 12/12/2012   Right groin pain 11/29/2012   OSA (obstructive sleep apnea) 11/25/2012   Ventral hernia 07/08/2012   High risk medication use 01/18/2012   Dyspnea 06/12/2011   Hyperlipidemia 12/27/2010   Osteoarthrosis,  unspecified whether generalized or localized, pelvic region and thigh 07/25/2010   Osteoarthrosis, hand 06/13/2010   Anemia 12/17/2009   Hypothyroidism 05/13/2009   GOUT 05/13/2009   Benign essential hypertension 05/13/2009   Right inguinal hernia 05/13/2009   HIATAL HERNIA 05/13/2009   NEPHROLITHIASIS 05/13/2009   ROSACEA 05/13/2009    PCP: [name], NP   REFERRING PROVIDER: Susa Raring., MD   REFERRING DIAG:  M25.562, G89.29 (ICD-10-CM) - Chronic pain in left knee  M25.561, G89.29 (ICD-10-CM) - Chronic pain in right knee   THERAPY DIAG:  Chronic pain of both knees  Muscle weakness (generalized)  Unsteadiness on feet  Other abnormalities of gait and mobility  Repeated falls  Other low back pain  RATIONALE FOR EVALUATION AND TREATMENT: Rehabilitation  ONSET DATE: off and on for a couple years  NEXT MD VISIT: 04/06/23   SUBJECTIVE:                                                                                                                                                                                                         SUBJECTIVE STATEMENT: Had a fall, had on bedroom shoes turned the corner with walker and the shoe slipped on floor and he fell and hit L arm on a bookcase.  PAIN: Are  you having pain? Yes: NPRS scale: 0/10 Pain location: B anterior knee pain with subluxation Pain description: brief sharp, aching Aggravating factors: patellar subluxation, prolonged standing Relieving factors: rubbing his knees with Icy/Hot  Are you having pain? Yes: NPRS scale: 0/10 currently, up to 5/10 Pain location: across low back Pain description: aching, pressure Aggravating factors: getting out of bed Relieving factors: nothing   PERTINENT HISTORY:  L2-3, L3-4 Sublaminar Decompression on 12/27/22; Revision of femoral component of L THA on 07/02/22 s/p fall with periprosthetic fracture; Aortic atherosclerosis; carotid stenosis; cervical myelopathy  s/p ACDF 06/2020 and posterior cervical fusion 11/2020; peripheral neuropathy; lumbar DDD/spondylosis; lumbago with sciatica/lumbar radiculopathy; OA; B TKA; B THA; L greater trochanteric pain syndrome; L RCR 2014; CTR; hearing loss; HTN; BPPV/vertigo; B sacroiliitis; skin cancer; COPD/emphysema; sleep apnea with CPAP; GERD; gout; HLD; LE cramps; RLS   PRECAUTIONS: Fall  WEIGHT BEARING RESTRICTIONS: No  FALLS:  Has patient fallen in last 6 months? Yes. 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  No fracture or bony lesion or other findings.  Impression: Right knee replacement in good condition.   01/31/23 - L knee x-ray: No fracture or bony lesion or arthritic change or other abnormality.  Left  knee replacement looks well-fixed and in excellent position.  Impression: Knee replacement in good condition.   PATIENT SURVEYS:  LEFS 31 / 80 = 38.8 %  COGNITION: Overall cognitive status: Within functional limits for tasks assessed and History of cognitive impairments - at baseline    SENSATION: Intermittent peripheral neuropathy  in B feet  MUSCLE LENGTH: TBA Hamstrings:  ITB:  Piriformis:  Hip flexors:  Quads:  Heelcord:   POSTURE:  rounded shoulders, forward head, and flexed trunk   LOWER EXTREMITY ROM: Limited B hip and ankle ROM Active ROM Right eval Left eval Right 04/03/23 Left 04/03/23  Knee flexion 104 96 110 108  Knee extension -10 -5 4 2    Passive ROM Right eval Left eval  Knee flexion    Knee extension 0 0  (Blank rows = not tested)  LOWER EXTREMITY MMT:  MMT Right Eval* Left Eval*  Hip flexion 3+ 4  Hip extension 3- 3+  Hip abduction 4- 4  Hip adduction 4 4  Hip internal rotation 3+ 4-  Hip external rotation 2+ 3-  Knee flexion 4 4  Knee extension 4 4+  Ankle dorsiflexion 4+ 4  Ankle plantarflexion    Ankle inversion    Ankle eversion     (Blank rows = not tested, *tested in sitting)  FUNCTIONAL TESTS: (Remaining tests to be assessed  next visit with PT) 5 times sit to stand: 18.94 sec w/o UE assist (uncontrolled descent on most reps) (03/22/23) Timed up and go (TUG): 21.29 sec with RW (03/22/23) 10 meter walk test: 21.13 sec with RW; Gait speed = 1.55 ft/sec  Berg Balance Scale: 35/56; <36 = High risk for falls (close to 100%) (03/22/23) Dynamic Gait Index: 11/24; Scores of 19 or less are predictive of falls in older community living adults (03/22/23)  GAIT: Distance walked: 60 ft Assistive device utilized: Environmental consultant - 2 wheeled Level of assistance: SBA Gait pattern: step through pattern, decreased stride length, decreased hip/knee flexion- Right, decreased hip/knee flexion- Left, decreased ankle dorsiflexion- Right, decreased ankle dorsiflexion- Left, trunk flexed, poor foot clearance- Right, and poor foot clearance- Left Comments: Cues necessary for improved upright posture and rolling walker proximity   TODAY'S TREATMENT:  04/17/23 THERAPEUTIC EXERCISE: to improve flexibility, strength and mobility.  Demonstration, verbal and tactile cues throughout for technique. Nustep  L5x50min Supine SKTC x 15 sec bil Supine isometric hip ER with belt 10x5" Isometric hip flexion with belt 10x5" LTR 10x each way Pt needed bathroom break afterwards TUG- 20 sec  5xSTS- 18 sec  04/09/23 THERAPEUTIC EXERCISE: to improve flexibility, strength and mobility.  Demonstration, verbal and tactile cues throughout for technique. Nustep L5x29min Gait 2 laps around 90 ft Clock balance bil 4x 1/2 way around Seated LAQ 2# 2 x 10 bil Seated march 2# 2x10 Standing side step and back x 10  Standing posterior step and back x 10 Runner stretch  04/03/23 THERAPEUTIC EXERCISE: to improve flexibility, strength and mobility.  Demonstration, verbal and tactile cues throughout for technique. Nustep L5x54min Supine pelvic tilts x 10 supine clams GTB 2 x 10  Supine march GTB 2 x 10 Supine SKTC x 30 sec bil Seated LAQ GTB 2x10  03/29/23 THERAPEUTIC EXERCISE: to improve flexibility, strength and mobility.  Demonstration, verbal and tactile cues throughout for technique.  Rec Bike - L1 x 6 min R long sitting quad set/TKE into small ball 10 x 5" Seated hip ADD isometric ball squeeze + R/L LAQ x 10 Hooklying hip ADD isometric ball squeeze + R/L SAQ over 8" FR 2 x 10 Bridge x 10 Hooklying RTB alt bent-knee fallout 2 x 10 Bridge + RTB hip ABD isometric x 10 Bridge + hip ADD ball squeeze x 10   03/26/23 THERAPEUTIC EXERCISE: to improve flexibility, strength and mobility.  Demonstration, verbal and tactile cues throughout for technique.  NuStep L5 x 6 min Sit to stands 2x10 with elevated mat (airex pad under bottom) Seated march no UE support x 10 bil Side steps from end to end of 3x with SPC, CGA Clock balance 5x bil with rollator Seated trunk rotation with yellow wt ball x 10    03/22/23 THERAPEUTIC EXERCISE: to improve flexibility, strength and mobility.  Demonstration, verbal and tactile cues throughout for technique.  NuStep L4 x 6 min  THERAPEUTIC ACTIVITIES: 5xSTS: 18.94 sec w/o UE  assist (uncontrolled descent on most reps) TUG: 21.29 sec with RW Berg:  35/56; <36 = High risk for falls (close to 100%)  DGI: 11/24; Scores of 19 or less are predictive of falls in older community living adults   GAIT TRAINING: To normalize gait pattern and improve safety with RW/FWW . Gait 200 feet with RW/FWW - cues for upright posture and improved walker proximity - patient demonstrating better foot clearance during swing through with good heel strike on weight acceptance but still noted to catch right  toes on occasion Stairs: Level of Assistance: Min A Stair Negotiation Technique: Step to Pattern Alternating Pattern  with Single Rail on Right Number of Stairs: 14  Height of Stairs: 7"  Comments: Patient attempting stairs with reciprocal alternating pattern however having difficulty clearing toes on stair ascent and increased instability with limited eccentric control noted on stair descent - patient encouraged to use step to pattern as needed for safety if needing to navigate stairs in place of alternating pattern.   PATIENT EDUCATION:  Education details: HEP progression - LAQ and runner stretch Person educated: Patient Education method: Explanation, Demonstration, Verbal cues, and Handouts Education comprehension: verbalized understanding, returned demonstration, verbal cues required, and needs further education  HOME EXERCISE PROGRAM: Access Code: [email or web address] URL: [email or web address] Date: 03/29/2023 Prepared by: Glenetta Hew  Exercises - Seated Hamstring Stretch with Strap  - 1 x daily - 7 x weekly - 3 sets - 30 sec hold - Seated Quad Set  - 1 x daily - 7 x weekly - 3 sets - 10 reps - 5 seconds hold - Seated Hip Adduction Squeeze with Ball  - 1 x daily - 7 x weekly - 3 sets - 10 reps - 5 secs hold - Supine Quad Set on Towel Roll  - 1 x daily - 7 x weekly - 2 sets - 10 reps - 3 sec hold - Hooklying Single Leg Bent Knee Fallouts with Resistance  - 1 x daily - 7 x  weekly - 2 sets - 10 reps - 3 sec hold - Supine Bridge with Resistance Band  - 1 x daily - 7 x weekly - 2 sets - 10 reps - 5 sec hold   ASSESSMENT:  CLINICAL IMPRESSION: Pt had no serious injury from recent fall only bruising seen on L arm and some soreness on L buttock. Reassessed TUG and 5xSTS with patient showing mild improvement, still hasn't met STGs yet.  Gently progressed exercises for hip strengthening, ok tolerance as patient noted some pressure in his groin with marching motion. He required a bathroom break after exercises, he will f/u with urologist to rule out pathologies. [name] Pratt will continue to benefit from skilled PT to address ongoing flexibility strength and balance deficits to improve mobility and activity tolerance with decreased pain interference and decreased risk for falls.   OBJECTIVE IMPAIRMENTS: Abnormal gait, decreased activity tolerance, decreased balance, decreased coordination, decreased endurance, decreased knowledge of condition, decreased knowledge of use of DME, decreased mobility, difficulty walking, decreased ROM, decreased strength, decreased safety awareness, increased fascial restrictions, impaired perceived functional ability, increased muscle spasms, impaired flexibility, impaired sensation, improper body mechanics, postural dysfunction, and pain.   ACTIVITY LIMITATIONS: carrying, lifting, bending, sitting, standing, squatting, sleeping, stairs, transfers, bed mobility, bathing, locomotion level, and caring for others  PARTICIPATION LIMITATIONS: meal prep, cleaning, laundry, driving, shopping, and community activity  PERSONAL FACTORS: Age, Behavior pattern, Fitness, Past/current experiences, Time since onset of injury/illness/exacerbation, and 3+ comorbidities: L2-3, L3-4 Sublaminar Decompression on 12/27/22; Revision of femoral component of L THA on 07/02/22 s/p fall with periprosthetic fracture; Aortic atherosclerosis; carotid stenosis; cervical myelopathy s/p  ACDF 06/2020 and posterior cervical fusion 11/2020; peripheral neuropathy; lumbar DDD/spondylosis; lumbago with sciatica/lumbar radiculopathy; OA; B TKA; B THA; L greater trochanteric pain syndrome; L RCR 2014; CTR; hearing loss; HTN; BPPV/vertigo; B sacroiliitis; skin cancer; COPD/emphysema; sleep apnea with CPAP; GERD; gout; HLD; LE cramps; RLS   are also affecting patient's functional outcome.   REHAB POTENTIAL: Good  CLINICAL DECISION MAKING: Evolving/moderate complexity  EVALUATION  COMPLEXITY: Moderate   GOALS: Goals reviewed with patient? Yes  SHORT TERM GOALS: Target date: 04/10/2023  Patient will be independent with initial HEP. Baseline:  Goal status: IN PROGRESS  2.  Complete balance testing and establish additional STG's/LTG's as appropriate. Baseline:  Goal status: MET  03/22/23  3.  Patient will improve 5x STS time to </= 16 seconds with good eccentric control for improved efficiency and safety with transfers Baseline: 18.94 sec w/o UE assist (uncontrolled descent on most reps) Goal status: IN PROGRESS - 04/17/23  4.  Patient will demonstrate decreased TUG time to </= 18 sec to decrease risk for falls with transitional mobility Baseline: 21.29 sec with RW Goal status: IN PROGRESS - 04/17/23   LONG TERM GOALS: Target date: 05/08/2023  Patient will be independent with advanced/ongoing HEP to improve outcomes and carryover.  Baseline:  Goal status: IN PROGRESS  2.  Patient will report at least 50-75% improvement in B knee pain with no further instances of subluxation to improve QOL. Baseline: 5/10 Goal status: IN PROGRESS  3.  Patient will demonstrate improved B knee extension AROM to [facility] to allow for improved stability for normal gait and stair mechanics. Baseline: Refer to above LE ROM table Goal status: IN PROGRESS- checked, see chart 04/03/23  4.  Patient will demonstrate improved B LE strength to >/= 4 to 4+/5 for improved stability and ease of mobility. Baseline:  Refer to above LE MMT table Goal status: IN PROGRESS  5.  Patient will be able to ambulate 400' with LRAD and normal gait pattern without increased pain to access community.  Baseline:  Goal status: IN PROGRESS  6. Patient will be able to ascend/descend stairs with 1 HR and reciprocal step pattern safely to access home and community.  Baseline:  Goal status: IN PROGRESS  7.  Patient will report >/= 40/80 on LEFS to demonstrate improved functional ability. Baseline: 31 / 80 = 38.8 % Goal status: IN PROGRESS  8.  Patient will demonstrate at least 19/24 on DGI to decrease risk of falls. Baseline: 11/24 (03/22/23) Goal status: IN PROGRESS   9.  Patient will improve Berg score by at least 8 points to improve safety and stability with ADLs in standing and reduce risk for falls. Baseline: 35/56 (03/22/23) Goal status: IN PROGRESS   PLAN:  PT FREQUENCY: 2x/week  PT DURATION: 8 weeks  PLANNED INTERVENTIONS: Therapeutic exercises, Therapeutic activity, Neuromuscular re-education, Balance training, Gait training, Patient/Family education, Self Care, Joint mobilization, Stair training, DME instructions, Aquatic Therapy, Dry Needling, Electrical stimulation, Spinal mobilization, Cryotherapy, Moist heat, Taping, Ultrasound, and Manual therapy  PLAN FOR NEXT SESSION:  L knee ROM; B LE flexibility and strengthening; balance training; review and progress HEP as indicated   Darleene Cleaver, PTA 04/17/2023, 4:36 PM

## 2023-04-20 ENCOUNTER — Ambulatory Visit: Payer: Medicare HMO

## 2023-04-23 DIAGNOSIS — R3915 Urgency of urination: Secondary | ICD-10-CM | POA: Diagnosis not present

## 2023-04-23 DIAGNOSIS — N401 Enlarged prostate with lower urinary tract symptoms: Secondary | ICD-10-CM | POA: Diagnosis not present

## 2023-04-23 DIAGNOSIS — N3941 Urge incontinence: Secondary | ICD-10-CM | POA: Diagnosis not present

## 2023-04-24 ENCOUNTER — Encounter: Payer: Self-pay | Admitting: Physical Therapy

## 2023-04-24 ENCOUNTER — Ambulatory Visit: Payer: Medicare HMO | Admitting: Physical Therapy

## 2023-04-24 DIAGNOSIS — M6281 Muscle weakness (generalized): Secondary | ICD-10-CM

## 2023-04-24 DIAGNOSIS — R2689 Other abnormalities of gait and mobility: Secondary | ICD-10-CM

## 2023-04-24 DIAGNOSIS — M25561 Pain in right knee: Secondary | ICD-10-CM | POA: Diagnosis not present

## 2023-04-24 DIAGNOSIS — R2681 Unsteadiness on feet: Secondary | ICD-10-CM | POA: Diagnosis not present

## 2023-04-24 DIAGNOSIS — M25562 Pain in left knee: Secondary | ICD-10-CM | POA: Diagnosis not present

## 2023-04-24 DIAGNOSIS — R296 Repeated falls: Secondary | ICD-10-CM | POA: Diagnosis not present

## 2023-04-24 DIAGNOSIS — G8929 Other chronic pain: Secondary | ICD-10-CM

## 2023-04-24 DIAGNOSIS — M5459 Other low back pain: Secondary | ICD-10-CM | POA: Diagnosis not present

## 2023-04-24 NOTE — Therapy (Signed)
OUTPATIENT PHYSICAL THERAPY TREATMENT   Patient Name: [name] Pratt MRN: [id number] DOB:29-Mar-1941, 82 y.o., male Today's Date: 04/24/2023   END OF SESSION:  PT End of Session - 04/24/23 1101     Visit Number 9    Date for PT Re-Evaluation 05/08/23    Authorization Type [name]    Authorization Time Period 03/13/23 - 05/08/23    Authorization - Visit Number 8    Authorization - Number of Visits 10    Progress Note Due on Visit 10    PT Start Time 1101    PT Stop Time 1145    PT Time Calculation (min) 44 min    Activity Tolerance Patient tolerated treatment well    Behavior During Therapy [facility] for tasks assessed/performed                  Past Medical History:  Diagnosis Date   Allergic rhinitis 02/02/2016   Anemia 12/17/2009   Formatting of this note might be different from the original. 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 Hypertension   Benign paroxysmal positional vertigo 09/14/2021   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HCC) 07/22/2021   Body mass index (BMI) 25.0-25.9, adult 11/02/2020   Cancer (HCC)    skin cancer   Cardiac murmur 07/25/2021   Carotid stenosis    a. Carotid U/S 5/13: LICA < 50%, RICA 50-69%;  b.  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   Cystoid macular edema of both eyes 07/28/2020   Degenerative disc  disease, lumbar 06/30/2015   Deviated nasal septum 02/18/2015   Diverticulosis 03/03/2020   DJD (degenerative joint disease) of hip 12/24/2012   Dyspnea 06/12/2011   Emphysema, unspecified (HCC) 03/05/2020   Erectile dysfunction 06/20/2017   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   Gait disorder 03/02/2021   GERD (gastroesophageal reflux disease)    GOUT 05/13/2009   Qualifier: Diagnosis of  By: Kem Parkinson     Greater trochanteric pain syndrome 07/22/2021   Hand pain, left 01/16/2022   Hearing loss 02/02/2016   Heme positive stool 02/29/2020   HIATAL HERNIA 05/13/2009   Qualifier: Diagnosis of  By: Kem Parkinson     High risk medication use 01/18/2012   Hip pain 03/02/2021   History of chicken pox    History of COVID-19 12/05/2021   History of hemorrhoids    History of kidney stones    History of skin cancer 07/22/2021   skin cancer   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 Cardiology - Dr Estrella Myrtle at [facility] cardiology  ICD-10 cut over   Hypertension    under control   Hypokalemia 07/08/2021   Hypothyroidism  Internal hemorrhoids 03/03/2020   Leg cramps 11/13/2019   Lower GI bleed 12/12/2012   Lumbago of lumbar region with sciatica 10/21/2020   Lumbar radiculopathy 03/02/2021   Lumbar spondylosis 11/02/2020   NEPHROLITHIASIS 05/13/2009   Qualifier: Diagnosis of  By: Kem Parkinson     Nonspecific abnormal electrocardiogram (ECG) (EKG) 01/06/2013   Numbness of hand 01/24/2022   Onychomycosis 06/30/2015   Osteoarthrosis, 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 Osteoarthritis Of The Hand  10/1 IMO update   Osteoarthrosis, unspecified whether generalized or localized, pelvic region and  thigh 07/25/2010   Formatting of this note might be different from the original. Osteoarthritis Of The Hip   Other abnormal glucose 07/22/2021   Other allergic rhinitis 02/18/2015   Palpitations 07/25/2021   Peripheral neuropathy 06/19/2016   Preventative health care 11/24/2015   Right groin pain 11/29/2012   Right inguinal hernia 05/13/2009   Qualifier: Diagnosis of  By: Kem Parkinson     RLS (restless legs syndrome) 02/18/2015   Rosacea    Rotator cuff tear 07/27/2013   Situational depression 03/02/2021   Skin lesion 01/16/2022   Sleep apnea with use of continuous positive airway pressure (CPAP) 07/15/2013   CPAP set to  7 cm water,  Residual AHi was 7.8  With no leak.  User time 6 hours.  479 days , not used only 12 days - highly compliant 06-23-13  .    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 Fawn Kirk   Ulnar neuropathy 01/24/2022   Urinary retention 11/26/2020   Ventral hernia 07/08/2012   Vertigo 07/08/2021   Weight loss 03/03/2020   Past Surgical History:  Procedure Laterality Date   ABDOMINAL HERNIA REPAIR  07/15/12   Dr Ashley Jacobs   ANTERIOR CERVICAL DECOMP/DISCECTOMY FUSION N/A 07/09/2020   Procedure: ANTERIOR CERVICAL DECOMPRESSION AND FUSION CERVICAL THREE-FOUR.;  Surgeon: Donalee Citrin, MD;  Location: [facility] OR;  Service: Neurosurgery;  Laterality: N/A;  anterior   BROW LIFT  05/07/01   COLONOSCOPY WITH PROPOFOL N/A 04/24/2022   Procedure: COLONOSCOPY WITH PROPOFOL;  Surgeon: Tressia Danas, MD;  Location: [facility] ENDOSCOPY;  Service: Gastroenterology;  Laterality: N/A;   COLONOSCOPY WITH PROPOFOL N/A 04/26/2022   Procedure: COLONOSCOPY WITH PROPOFOL;  Surgeon: Tressia Danas, MD;   Location: [facility] ENDOSCOPY;  Service: Gastroenterology;  Laterality: N/A;   CYSTOSCOPY WITH URETEROSCOPY AND STENT PLACEMENT Right 01/28/2014   Procedure: CYSTOSCOPY WITH URETEROSCOPY, BASKET RETRIVAL AND  STENT PLACEMENT;  Surgeon: Valetta Fuller, MD;  Location: [facility] ORS;  Service: Urology;  Laterality: Right;   epidural injections     multiple procedures   ESOPHAGEAL DILATION  1993 and 1994   multiple times   EYE SURGERY Bilateral 03/25/10, 2012   cataract removal   HEMORRHOID SURGERY  2014   HIATAL HERNIA REPAIR  12/21/93   HOLMIUM LASER APPLICATION Right 01/28/2014   Procedure: HOLMIUM LASER APPLICATION;  Surgeon: Valetta Fuller, MD;  Location: [facility] ORS;  Service: Urology;  Laterality: Right;   INGUINAL HERNIA REPAIR Right 07/15/12   Dr Ashley Jacobs, x2   JOINT REPLACEMENT  07/25/04   right knee   JOINT REPLACEMENT  07/13/10   left knee   KNEE SURGERY  1995  LAPAROSCOPIC CHOLECYSTECTOMY  09/20/06   with intraoperative cholangiogram and right inguinal herniorrhaphy with mesh    LIGAMENT REPAIR Left 07/2013   shoulder   LITHOTRIPSY     x2   LUMBAR LAMINECTOMY/DECOMPRESSION MICRODISCECTOMY Bilateral 12/27/2022   Procedure: Lumbar Two-Three, Lumbar Three-Four Sublaminar Decompression;  Surgeon: Donalee Citrin, MD;  Location: [facility] OR;  Service: Neurosurgery;  Laterality: Bilateral;   NASAL SEPTUM SURGERY  1975   POLYPECTOMY  04/26/2022   Procedure: POLYPECTOMY;  Surgeon: Tressia Danas, MD;  Location: [facility] ENDOSCOPY;  Service: Gastroenterology;;   POSTERIOR CERVICAL FUSION/FORAMINOTOMY N/A 11/24/2020   Procedure: Posterior Cervical Laminectomy Cervical three-four, Cervical four-five with lateral mass fusion;  Surgeon: Donalee Citrin, MD;  Location: [facility] OR;  Service: Neurosurgery;  Laterality: N/A;   REFRACTIVE SURGERY Bilateral    Right total hip replacement  4/14   SKIN CANCER DESTRUCTION     nose, ear   TONSILLECTOMY  as child   TOTAL HIP ARTHROPLASTY Left    URETHRAL DILATION  1991   Dr. Annabell Howells   Patient  Active Problem List   Diagnosis Date Noted   Spinal stenosis of lumbar region 12/27/2022   Drowsiness 12/07/2022   Preop examination 12/06/2022   Weakness of lower extremity 10/10/2022   Skin tear of right elbow without complication 09/18/2022   Skin tear of left lower leg without complication 09/18/2022   Fall 09/18/2022   Iron deficiency anemia 09/18/2022   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   Vitamin D deficiency 08/07/2022   Closed fracture of left hip (HCC) 08/07/2022   Hand weakness 05/02/2022   Acute kidney injury (HCC) 05/02/2022   Adenomatous polyp of ascending colon    Adenomatous polyp of colon    Acute metabolic encephalopathy 04/24/2022   GI bleed 04/21/2022   Vasovagal syncope    Hemorrhagic shock (HCC)    Hematochezia    Irregular heart beat 02/03/2022   Constipation 02/03/2022   Carpal tunnel syndrome 01/24/2022   Numbness of hand 01/24/2022   Ulnar neuropathy 01/24/2022   Skin lesion 01/16/2022   History of COVID-19 12/05/2021   Benign paroxysmal positional vertigo 09/14/2021   Palpitations 07/25/2021   Cardiac murmur 07/25/2021   History of chicken pox 07/22/2021   History of hemorrhoids 07/22/2021   Hypertension 07/22/2021   COPD (chronic obstructive pulmonary disease) (HCC) 07/22/2021   History of kidney stones 07/22/2021   History of skin cancer 07/22/2021   Complication of anesthesia 07/22/2021   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HCC) 07/22/2021   Other abnormal glucose 07/22/2021   Spondylitis (HCC) 07/22/2021   Greater trochanteric pain syndrome 07/22/2021   Vertigo 07/08/2021   Hypokalemia 07/08/2021   Trochanteric bursitis of left hip 06/30/2021   Cancer (HCC) 04/11/2021   Lumbar radiculopathy 03/02/2021   Gait instability 03/02/2021   Hip pain 03/02/2021   Situational depression 03/02/2021   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   Urinary retention 11/26/2020   Cervical  myelopathy (HCC) 11/24/2020   Body mass index (BMI) 25.0-25.9, adult 11/02/2020   Lumbar spondylosis 11/02/2020   Lumbago of lumbar region with sciatica 10/21/2020   Cystoid macular edema of both eyes 07/28/2020   Status post cervical spinal fusion 07/09/2020   History of total bilateral knee replacement 07/08/2020   Aortic atherosclerosis (HCC) 03/05/2020   Emphysema, unspecified (HCC) 03/05/2020   Arthritis 03/04/2020   Diverticulosis 03/03/2020   Internal hemorrhoids 03/03/2020   Weight loss 03/03/2020   Heme positive stool 02/29/2020   Leg cramps  11/13/2019   Tibialis anterior tendon tear, nontraumatic 11/13/2019   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 07/29/2018   CPAP (continuous positive airway pressure) dependence 03/25/2018   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   Erectile dysfunction 06/20/2017   Stenosis of right carotid artery without cerebral infarction 03/06/2017   Peripheral neuropathy 06/19/2016   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   Hearing loss 02/02/2016   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   Allergic rhinitis 02/02/2016   Atypical chest pain 11/24/2015   Preventative health care 11/24/2015   Onychomycosis 06/30/2015   Degenerative disc disease, lumbar 06/30/2015   Other allergic rhinitis 02/18/2015   Deviated nasal septum 02/18/2015   RLS (restless legs syndrome) 02/18/2015   GERD (gastroesophageal reflux disease) 09/18/2014   Hyperglycemia 03/03/2014   Rotator cuff tear 07/27/2013   Sleep apnea with use of continuous positive airway pressure (CPAP) 07/15/2013   Carotid stenosis 02/24/2013   Nonspecific abnormal electrocardiogram (ECG) (EKG) 01/06/2013   DJD (degenerative joint disease) of hip 12/24/2012   Lower GI bleed 12/12/2012   Right groin pain 11/29/2012   OSA (obstructive sleep apnea) 11/25/2012   Ventral hernia 07/08/2012   High risk medication use 01/18/2012   Dyspnea 06/12/2011   Hyperlipidemia 12/27/2010    Osteoarthrosis, unspecified whether generalized or localized, pelvic region and thigh 07/25/2010   Osteoarthrosis, hand 06/13/2010   Anemia 12/17/2009   Hypothyroidism 05/13/2009   GOUT 05/13/2009   Benign essential hypertension 05/13/2009   Right inguinal hernia 05/13/2009   HIATAL HERNIA 05/13/2009   NEPHROLITHIASIS 05/13/2009   ROSACEA 05/13/2009    PCP: [name], NP   REFERRING PROVIDER: Susa Raring., MD   REFERRING DIAG:  M25.562, G89.29 (ICD-10-CM) - Chronic pain in left knee  M25.561, G89.29 (ICD-10-CM) - Chronic pain in right knee   THERAPY DIAG:  Chronic pain of both knees  Muscle weakness (generalized)  Unsteadiness on feet  Other abnormalities of gait and mobility  Repeated falls  Other low back pain  RATIONALE FOR EVALUATION AND TREATMENT: Rehabilitation  ONSET DATE: off and on for a couple years  NEXT MD VISIT: none scheduled   SUBJECTIVE:                                                                                                                                                                                                         SUBJECTIVE STATEMENT: Pt reports he still feels weak on his R side. Saw his neurologist yesterday who told him his nerves are likely still impaired which is affecting his strength as well  as his bladder control. He reports another "soft fall" getting up from his office chair when the chair rolled back as he was getting up yesterday.   PAIN: Are you having pain? No - B knees  Are you having pain? No - low back  PERTINENT HISTORY:  L2-3, L3-4 Sublaminar Decompression on 12/27/22; Revision of femoral component of L THA on 07/02/22 s/p fall with periprosthetic fracture; Aortic atherosclerosis; carotid stenosis; cervical myelopathy s/p ACDF 06/2020 and posterior cervical fusion 11/2020; peripheral neuropathy; lumbar DDD/spondylosis; lumbago with sciatica/lumbar radiculopathy; OA; B TKA; B THA; L greater  trochanteric pain syndrome; L RCR 2014; CTR; hearing loss; HTN; BPPV/vertigo; B sacroiliitis; skin cancer; COPD/emphysema; sleep apnea with CPAP; GERD; gout; HLD; LE cramps; RLS   PRECAUTIONS: Fall  WEIGHT BEARING RESTRICTIONS: No  FALLS:  Has patient fallen in last 6 months? Yes. 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  No fracture or bony lesion or other findings.  Impression: Right knee replacement in good condition.   01/31/23 - L knee x-ray: No fracture or bony lesion or arthritic change or other abnormality.  Left  knee replacement looks well-fixed and in excellent position.  Impression: Knee replacement in good condition.   PATIENT SURVEYS:  LEFS 31 / 80 = 38.8 %  COGNITION: Overall cognitive status: Within functional limits for tasks assessed and History of cognitive impairments - at baseline    SENSATION: Intermittent peripheral neuropathy in B feet  MUSCLE LENGTH: TBA Hamstrings:  ITB:  Piriformis:  Hip flexors:  Quads:  Heelcord:   POSTURE:  rounded shoulders, forward head, and flexed trunk    LOWER EXTREMITY ROM: Limited B hip and ankle ROM Active ROM Right eval Left eval Right 04/03/23 Left 04/03/23  Knee flexion 104 96 110 108  Knee extension -10 -5 4 2    Passive ROM Right eval Left eval  Knee flexion    Knee extension 0 0  (Blank rows = not tested)  LOWER EXTREMITY MMT:  MMT Right Eval* Left Eval*  Hip flexion 3+ 4  Hip extension 3- 3+  Hip abduction 4- 4  Hip adduction 4 4  Hip internal rotation 3+ 4-  Hip external rotation 2+ 3-  Knee flexion 4 4  Knee extension 4 4+  Ankle dorsiflexion 4+ 4  Ankle plantarflexion    Ankle inversion    Ankle eversion     (Blank rows = not tested, *tested in sitting)  FUNCTIONAL TESTS: (Remaining tests to be assessed next visit with PT) 5 times sit to stand: 18.94 sec w/o UE assist (uncontrolled descent on most reps) (  03/22/23) Timed up and go (TUG): 21.29 sec with RW (03/22/23) 10 meter walk test: 21.13 sec with RW; Gait speed = 1.55 ft/sec  Berg Balance Scale: 35/56; <36 = High risk for falls (close to 100%) (03/22/23) Dynamic Gait Index: 11/24; Scores of 19 or less are predictive of falls in older community living adults (03/22/23)  GAIT: Distance walked: 60 ft Assistive device utilized: Environmental consultant - 2 wheeled Level of assistance: SBA Gait pattern: step through pattern, decreased stride length, decreased hip/knee flexion- Right, decreased hip/knee flexion- Left, decreased ankle dorsiflexion- Right, decreased ankle dorsiflexion- Left, trunk flexed, poor foot clearance- Right, and poor foot clearance- Left Comments: Cues necessary for improved upright posture and rolling walker proximity   TODAY'S TREATMENT:   04/24/23 THERAPEUTIC EXERCISE: to improve flexibility, strength and mobility.  Demonstration, verbal and tactile cues throughout for technique. NuStep L5 x 8 min Standing R/L glute med 45 kickback x 15 bil; B UE support on counter Standing R/L hip flexion + ER x 15 bil; B UE support on counter  NEUROMUSCULAR  RE-EDUCATION: To improve balance, proprioception, coordination, reduce fall risk, and amplitude of movement.  Lateral weight shifts in wide BOS x 20 bil Weight shifts ant/post in staggered stance x 10 with each foot fwd B side-stepping 3 x 10 ft along counter with light UE support on counter/hovering just above counter and SBA/CGA of PT - increased difficulty advancing R foot to side even with cues to lift leg flexing hip and knee Fwd tandem gait 4 x 10 ft along counter with single UE support on counter and SBA/CGA of PT Retro gait 6 x 10 ft along counter with single UE support on counter and SBA/CGA of PT   04/17/23 THERAPEUTIC EXERCISE: to improve flexibility, strength and mobility.  Demonstration, verbal and tactile cues throughout for technique. Nustep L5x81min Supine SKTC x 15 sec bil Supine isometric hip ER with belt 10x5" Isometric hip flexion with belt 10x5" LTR 10x each way Pt needed bathroom break afterwards TUG- 20 sec  5xSTS- 18 sec   04/09/23 THERAPEUTIC EXERCISE: to improve flexibility, strength and mobility.  Demonstration, verbal and tactile cues throughout for technique. Nustep L5x16min Gait 2 laps around 90 ft Clock balance bil 4x 1/2 way around Seated LAQ 2# 2 x 10 bil Seated march 2# 2x10 Standing side step and back x 10  Standing posterior step and back x 10 Runner stretch    PATIENT EDUCATION:  Education details: HEP progression - LAQ and runner stretch Person educated: Patient Education method: Explanation, Demonstration, Verbal cues, and Handouts Education comprehension: verbalized understanding, returned demonstration, verbal cues required, and needs further education  HOME EXERCISE PROGRAM: Access Code: [email or web address] URL: [email or web address] Date: 03/29/2023 Prepared by: Glenetta Hew  Exercises - Seated Hamstring Stretch with Strap  - 1 x daily - 7 x weekly - 3 sets - 30 sec hold - Seated Quad Set  - 1 x daily - 7 x weekly - 3 sets - 10  reps - 5 seconds hold - Seated Hip Adduction Squeeze with Ball  - 1 x daily - 7 x weekly - 3 sets - 10 reps - 5 secs hold - Supine Quad Set on Towel Roll  - 1 x daily - 7 x weekly - 2 sets - 10 reps - 3 sec hold - Hooklying Single Leg Bent Knee Fallouts with Resistance  - 1 x daily - 7 x weekly - 2 sets - 10 reps - 3 sec hold - Supine Bridge with Resistance Band  -  1 x daily - 7 x weekly - 2 sets - 10 reps - 5 sec hold   ASSESSMENT:  CLINICAL IMPRESSION: Camdon Bochenek" reports another fall where his wheeled office chair rolled back on him while he was getting up leading to a "soft fall" sliding down the edge of the seat to the floor.  He denies any injury and reports he was able to get up on his own after the fall.  Therapy session focusing on multidirectional stability given recent lateral and posterior falls. He has more difficulty with activities leading with R foot due to ongoing R>L LE weakness, with a few minor LOB occurring during exercises requiring PT assistance to stabilize.  Will plan for formal assessment of his progress with PT as part of his 10th visit assessment next visit.  OBJECTIVE IMPAIRMENTS: Abnormal gait, decreased activity tolerance, decreased balance, decreased coordination, decreased endurance, decreased knowledge of condition, decreased knowledge of use of DME, decreased mobility, difficulty walking, decreased ROM, decreased strength, decreased safety awareness, increased fascial restrictions, impaired perceived functional ability, increased muscle spasms, impaired flexibility, impaired sensation, improper body mechanics, postural dysfunction, and pain.   ACTIVITY LIMITATIONS: carrying, lifting, bending, sitting, standing, squatting, sleeping, stairs, transfers, bed mobility, bathing, locomotion level, and caring for others  PARTICIPATION LIMITATIONS: meal prep, cleaning, laundry, driving, shopping, and community activity  PERSONAL FACTORS: Age, Behavior pattern,  Fitness, Past/current experiences, Time since onset of injury/illness/exacerbation, and 3+ comorbidities: L2-3, L3-4 Sublaminar Decompression on 12/27/22; Revision of femoral component of L THA on 07/02/22 s/p fall with periprosthetic fracture; Aortic atherosclerosis; carotid stenosis; 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   REHAB POTENTIAL: Good  CLINICAL DECISION MAKING: Evolving/moderate complexity  EVALUATION COMPLEXITY: Moderate   GOALS: Goals reviewed with patient? Yes  SHORT TERM GOALS: Target date: 04/10/2023  Patient will be independent with initial HEP. Baseline:  Goal status: IN PROGRESS  2.  Complete balance testing and establish additional STG's/LTG's as appropriate. Baseline:  Goal status: MET  03/22/23  3.  Patient will improve 5x STS time to </= 16 seconds with good eccentric control for improved efficiency and safety with transfers Baseline: 18.94 sec w/o UE assist (uncontrolled descent on most reps) Goal status: IN PROGRESS  04/17/23 - 18 sec  4.  Patient will demonstrate decreased TUG time to </= 18 sec to decrease risk for falls with transitional mobility Baseline: 21.29 sec with RW Goal status: IN PROGRESS  04/17/23 - 20 sec  LONG TERM GOALS: Target date: 05/08/2023  Patient will be independent with advanced/ongoing HEP to improve outcomes and carryover.  Baseline:  Goal status: IN PROGRESS  2.  Patient will report at least 50-75% improvement in B knee pain with no further instances of subluxation to improve QOL. Baseline: 5/10 Goal status: IN PROGRESS  3.  Patient will demonstrate  improved B knee extension AROM to [facility] to allow for improved stability for normal gait and stair mechanics. Baseline: Refer to above LE ROM table Goal status: IN PROGRESS  04/03/23 - refer to ROM table  4.  Patient will demonstrate improved B LE strength to >/= 4 to 4+/5 for improved stability and ease of mobility. Baseline: Refer to above LE MMT table Goal  status: IN PROGRESS  5.  Patient will be able to ambulate 400' with LRAD and normal gait pattern without increased pain to access community.  Baseline:  Goal status: IN PROGRESS  6. Patient will be able to ascend/descend stairs with 1 HR and reciprocal step pattern safely to access home and community.  Baseline:  Goal status: IN PROGRESS  7.  Patient will report >/= 40/80 on LEFS to demonstrate improved functional ability. Baseline: 31 / 80 = 38.8 % Goal status: IN PROGRESS  8.  Patient will demonstrate at least 19/24 on DGI to decrease risk of falls. Baseline: 11/24 (03/22/23) Goal status: IN PROGRESS   9.  Patient will improve Berg score by at least 8 points to improve safety and stability with ADLs in standing and reduce risk for falls. Baseline: 35/56 (03/22/23) Goal status: IN PROGRESS   PLAN:  PT FREQUENCY: 2x/week  PT DURATION: 8 weeks  PLANNED INTERVENTIONS: Therapeutic exercises, Therapeutic activity, Neuromuscular re-education, Balance training, Gait training, Patient/Family education, Self Care, Joint mobilization, Stair training, DME instructions, Aquatic Therapy, Dry Needling, Electrical stimulation, Spinal mobilization, Cryotherapy, Moist heat, Taping, Ultrasound, and Manual therapy  PLAN FOR NEXT SESSION:  10th visit PN; L knee ROM; B LE flexibility and strengthening; balance training; review and progress HEP as indicated   Marry Guan, PT 04/24/2023, 12:15 PM

## 2023-04-25 NOTE — Progress Notes (Unsigned)
PATIENT: [name] Pratt DOB: 13-Sep-1940  REASON FOR VISIT: follow up HISTORY FROM: patient  Chief Complaint  Patient presents with   Obstructive Sleep Apnea    RM 2 alone Pt is well, reports he has some air leakage in mask. Otherwise he is stable, no new OSA/CPAP concerns      HISTORY OF PRESENT ILLNESS: Today 04/26/23:  [name] Pratt is a 82 y.o. male with a history of OSA on CPAP. Returns today for follow-up. Reports CPAP is working well. Does notice the mask leaking. Has tried several masks in the past.  Has had several procedures since last seen. Had cervical spine surgery. Also has had several falls that results in skin lacerations.   BP is elevated today. Advised to check at home. Reports that medications have been reduced d/t hypotension.       04/20/22: [name] Pratt is an 82 year old male with OSA on CPAP. He returns today for  Reports that he wakes up feeling rested. Never did the titration study because he had other tests going on at that time. Would like to schedule now. DL is below.     5/5/2: [name] Pratt is an 82 year old male with a history of obstructive sleep apnea on CPAP.  He returns today for follow-up.  He reports that since his last visit he has had 2 neck surgeries.  He reports during that time he found it difficult to use his CPAP.  He denies any new issues with the CPAP.  He returns today for an evaluation.  HISTORY:  [name] Pratt is a 82 year old male with a history of obstructive sleep apnea on CPAP.  His download indicates that he uses machine nightly for compliance of 100%.  He uses machine greater than 4 hours each night.  On average he uses his machine 7 hours and 32 minutes.  His residual AHI is 3.8 on 8 to 14 cm of water with EPR of 2.  He does not have a significant leak.  He states that the CPAP is working better for him.  He states that the new machine is much better.  He denies any new issues.  He returns today for an  evaluation.  REVIEW OF SYSTEMS: Out of a complete 14 system review of symptoms, the patient complains only of the following symptoms, and all other reviewed systems are negative.  ESS 11     ALLERGIES: Allergies  Allergen Reactions   Pravastatin Other (See Comments)    Muscle cramps   Sildenafil Other (See Comments)    Tachycardia     Oxycodone Other (See Comments)    Hallucinations    Atenolol Other (See Comments)    Bradycardia    Elastic Bandages & [Zinc] Other (See Comments)    Teflon bandages - Irritation   Metoclopramide Hcl Anxiety    HOME MEDICATIONS: Outpatient Medications Prior to Visit  Medication Sig Dispense Refill   acetaminophen (TYLENOL) 500 MG tablet Take 500 mg by mouth 2 (two) times daily.     albuterol (VENTOLIN HFA) 108 (90 Base) MCG/ACT inhaler INHALE 2 PUFFS INTO THE LUNGS EVERY 6 HOURS AS NEEDED FOR SHORTNESS OF BREATH. (Patient taking differently: Inhale 2 puffs into the lungs every 6 (six) hours as needed for shortness of breath.) 1 each 3   allopurinol (ZYLOPRIM) 100 MG tablet TAKE 1 TABLET (100 MG TOTAL) BY MOUTH 2 (TWO) TIMES DAILY. 180 tablet 1   antiseptic oral rinse (BIOTENE) LIQD 15 mLs by Mouth Rinse  route 2 (two) times daily.     aspirin EC 81 MG tablet Take 81 mg by mouth at bedtime.     bacitracin ointment Apply 1 Application topically 2 (two) times daily. (Patient taking differently: Apply 1 Application topically daily as needed for wound care.) 120 g 0   cholestyramine (QUESTRAN) 4 GM/DOSE powder Take by mouth daily. 30 ml     cycloSPORINE (RESTASIS) 0.05 % ophthalmic emulsion Place 1 drop into both eyes 2 (two) times daily.     docusate sodium (COLACE) 100 MG capsule Take 1 capsule (100 mg total) by mouth every 12 (twelve) hours. 60 capsule 0   fluticasone (FLONASE) 50 MCG/ACT nasal spray Place 1 spray into both nostrils daily as needed for allergies.     gabapentin (NEURONTIN) 300 MG capsule Take 300 mg by mouth at bedtime.      GEMTESA 75 MG TABS Take 75 mg by mouth daily.     glucose blood test strip TRUE Metrix Blood Glucose Test Strip, use as instructed 100 each 12   hydrALAZINE (APRESOLINE) 50 MG tablet TAKE 1 TABLET (50 MG TOTAL) BY MOUTH 2 (TWO) TIMES DAILY. 180 tablet 3   Iron, Ferrous Sulfate, 325 (65 Fe) MG TABS Take 325 mg by mouth every other day. (Patient taking differently: Take 325 mg by mouth daily.) 30 tablet 0   Lancets MISC 1 each by Does not apply route 2 (two) times daily. TRUE matrix lancets 100 each 3   levothyroxine (SYNTHROID) 150 MCG tablet TAKE 1 TABLET ON 6 DAYS PER WEEK (TAKE 75 MCG ONE TIME PER WEEK) 78 tablet 1   levothyroxine (SYNTHROID) 75 MCG tablet Take 75 mcg once a week (patient on 150 mcg 6 days a week) Dispense #24 for 90 day supply. (Patient taking differently: Take 75 mcg once a week (patient on 150 mcg 6 days a week) Dispense #24 for 90 day supply. Sunday) 12 tablet 1   loratadine (CLARITIN) 10 MG tablet Take 10 mg by mouth daily as needed for allergies or rhinitis.     losartan (COZAAR) 100 MG tablet TAKE 1 TABLET EVERY DAY 90 tablet 1   Magnesium 500 MG TABS Take 500 mg by mouth daily.     Menthol, Topical Analgesic, (ICY HOT EX) Apply 1 Application topically at bedtime. Lidocaine / on back     metroNIDAZOLE (METROCREAM) 0.75 % cream Apply 1 Application topically daily as needed (head).     Multiple Vitamins-Minerals (MULTIVITAMIN WITH MINERALS) tablet Take 1 tablet by mouth daily.     multivitamin-lutein (OCUVITE-LUTEIN) CAPS capsule Take 1 capsule by mouth daily.     nitroGLYCERIN (NITROSTAT) 0.4 MG SL tablet Place 1 tablet (0.4 mg total) under the tongue every 5 (five) minutes as needed for chest pain. 25 tablet 6   OVER THE COUNTER MEDICATION Place 1 spray into both nostrils 2 (two) times daily. Sinus saline     OVER THE COUNTER MEDICATION Take 15 mLs by mouth at bedtime. Yellow Mustard     polyethylene glycol powder (GLYCOLAX/MIRALAX) 17 GM/SCOOP powder Take 17 g by mouth 2  (two) times daily as needed for mild constipation, moderate constipation or severe constipation. 255 g 0   polyvinyl alcohol (ARTIFICIAL TEARS) 1.4 % ophthalmic solution Place 1 drop into both eyes daily as needed for dry eyes.     PREBIOTIC PRODUCT PO Take 1 tablet by mouth daily.     PRESCRIPTION MEDICATION CPAP- At bedtime and during any time of rest  Probiotic Product (PROBIOTIC DAILY PO) Take 1 capsule by mouth daily as needed (Constipation).     rosuvastatin (CRESTOR) 5 MG tablet TAKE 1 TABLET EVERY DAY 90 tablet 1   vitamin B-12 (CYANOCOBALAMIN) 500 MCG tablet Take 500 mcg by mouth in the morning.     No facility-administered medications prior to visit.    PAST MEDICAL HISTORY: Past Medical History:  Diagnosis Date   Allergic rhinitis 02/02/2016   Anemia 12/17/2009   Formatting of this note might be different from the original. 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 Hypertension   Benign paroxysmal positional vertigo 09/14/2021   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HCC) 07/22/2021   Body mass index (BMI) 25.0-25.9, adult 11/02/2020   Cancer (HCC)    skin cancer   Cardiac murmur 07/25/2021   Carotid stenosis    a. Carotid U/S 5/13: LICA < 50%, RICA 50-69%;  b.  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   Cystoid macular edema of both eyes 07/28/2020    Degenerative disc disease, lumbar 06/30/2015   Deviated nasal septum 02/18/2015   Diverticulosis 03/03/2020   DJD (degenerative joint disease) of hip 12/24/2012   Dyspnea 06/12/2011   Emphysema, unspecified (HCC) 03/05/2020   Erectile dysfunction 06/20/2017   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   Gait disorder 03/02/2021   GERD (gastroesophageal reflux disease)    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   Qualifier: Diagnosis of  By: Kem Parkinson     Greater trochanteric pain syndrome 07/22/2021   Hand pain, left 01/16/2022   Hearing loss 02/02/2016   Heme positive stool 02/29/2020   HIATAL HERNIA 05/13/2009   Qualifier: Diagnosis of  By: Kem Parkinson     High risk medication use 01/18/2012   Hip pain 03/02/2021   History of chicken pox    History of COVID-19 12/05/2021   History of hemorrhoids    History of kidney stones    History of skin cancer 07/22/2021   skin cancer   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  Osteoarthritis Of The Hand  10/1 IMO update   Osteoarthrosis, unspecified whether generalized or localized,  pelvic region and thigh 07/25/2010   Formatting of this note might be different from the original. Osteoarthritis Of The Hip   Other abnormal glucose 07/22/2021   Other allergic rhinitis 02/18/2015   Palpitations 07/25/2021   Peripheral neuropathy 06/19/2016   Preventative health care 11/24/2015   Right groin pain 11/29/2012   Right inguinal hernia 05/13/2009   Qualifier: Diagnosis of  By: Kem Parkinson     RLS (restless legs syndrome) 02/18/2015   Rosacea    Rotator cuff tear 07/27/2013   Situational depression 03/02/2021   Skin lesion 01/16/2022   Sleep apnea with use of continuous positive airway pressure (CPAP) 07/15/2013   CPAP set to  7 cm water,  Residual AHi was 7.8  With no leak.  User time 6 hours.  479 days , not used only 12 days - highly compliant 06-23-13  .    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 Fawn Kirk   Ulnar neuropathy 01/24/2022   Urinary retention 11/26/2020   Ventral hernia 07/08/2012   Vertigo 07/08/2021   Weight loss 03/03/2020    PAST SURGICAL HISTORY: Past Surgical History:  Procedure Laterality Date   ABDOMINAL HERNIA REPAIR  07/15/12   Dr Ashley Jacobs   ANTERIOR CERVICAL DECOMP/DISCECTOMY FUSION N/A 07/09/2020   Procedure: ANTERIOR CERVICAL DECOMPRESSION AND FUSION CERVICAL THREE-FOUR.;  Surgeon: Donalee Citrin, MD;  Location: [facility] OR;  Service: Neurosurgery;  Laterality: N/A;  anterior   BROW LIFT  05/07/01   COLONOSCOPY WITH PROPOFOL N/A 04/24/2022   Procedure: COLONOSCOPY WITH PROPOFOL;  Surgeon: Tressia Danas, MD;  Location: [facility] ENDOSCOPY;  Service: Gastroenterology;  Laterality: N/A;   COLONOSCOPY WITH PROPOFOL N/A 04/26/2022   Procedure: COLONOSCOPY WITH  PROPOFOL;  Surgeon: Tressia Danas, MD;  Location: [facility] ENDOSCOPY;  Service: Gastroenterology;  Laterality: N/A;   CYSTOSCOPY WITH URETEROSCOPY AND STENT PLACEMENT Right 01/28/2014   Procedure: CYSTOSCOPY WITH URETEROSCOPY, BASKET RETRIVAL AND  STENT PLACEMENT;  Surgeon: Valetta Fuller, MD;  Location: [facility] ORS;  Service: Urology;  Laterality: Right;   epidural injections     multiple procedures   ESOPHAGEAL DILATION  1993 and 1994   multiple times   EYE SURGERY Bilateral 03/25/10, 2012   cataract removal   HEMORRHOID SURGERY  2014   HIATAL HERNIA REPAIR  12/21/93   HOLMIUM LASER APPLICATION Right 01/28/2014   Procedure: HOLMIUM LASER APPLICATION;  Surgeon: Valetta Fuller, MD;  Location: [facility] ORS;  Service: Urology;  Laterality: Right;   INGUINAL HERNIA REPAIR Right 07/15/12   Dr Ashley Jacobs, x2   JOINT REPLACEMENT  07/25/04   right knee   JOINT REPLACEMENT  07/13/10   left knee   KNEE SURGERY  1995   LAPAROSCOPIC CHOLECYSTECTOMY  09/20/06   with intraoperative cholangiogram and right inguinal herniorrhaphy with mesh    LIGAMENT REPAIR Left 07/2013   shoulder   LITHOTRIPSY     x2   LUMBAR LAMINECTOMY/DECOMPRESSION MICRODISCECTOMY Bilateral 12/27/2022   Procedure: Lumbar Two-Three, Lumbar Three-Four Sublaminar Decompression;  Surgeon: Donalee Citrin, MD;  Location: [facility] OR;  Service: Neurosurgery;  Laterality: Bilateral;   NASAL SEPTUM SURGERY  1975   POLYPECTOMY  04/26/2022  Procedure: POLYPECTOMY;  Surgeon: Tressia Danas, MD;  Location: [facility] ENDOSCOPY;  Service: Gastroenterology;;   POSTERIOR CERVICAL FUSION/FORAMINOTOMY N/A 11/24/2020   Procedure: Posterior Cervical Laminectomy Cervical three-four, Cervical four-five with lateral mass fusion;  Surgeon: Donalee Citrin, MD;  Location: [facility] OR;  Service: Neurosurgery;  Laterality: N/A;   REFRACTIVE SURGERY Bilateral    Right total hip replacement  4/14   SKIN CANCER DESTRUCTION     nose, ear   TONSILLECTOMY  as child   TOTAL HIP ARTHROPLASTY Left     URETHRAL DILATION  1991   Dr. 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   2 grandchildren   Has worked for Warden/r[employer]   Enjoys wood working   Completed HS.  [employer]   Social Determinants of Health   Financial Resource Strain: Low Risk  (10/28/2021)   Overall Financial Resource Strain (CARDIA)    Difficulty of Paying Living Expenses: Not very hard  Food Insecurity: Unknown (06/05/2022)   Hunger Vital Sign    Worried About Running Out of Food in the Last Year: Never true    Ran Out of Food in the Last Year: Not on file  Transportation Needs: Unknown (06/05/2022)   PRAPARE - Administrator, Civil Service (Medical): No    Lack of Transportation (Non-Medical): Not on file  Physical Activity: Insufficiently Active (04/09/2023)   Exercise Vital Sign    Days of Exercise per Week: 2 days    Minutes of Exercise per Session: 10 min  Stress: Stress  Concern Present (04/09/2023)   [employer] of Occupational Health - Occupational Stress Questionnaire    Feeling of Stress : To some extent  Social Connections: Moderately Isolated (01/09/2023)   Social Connection and Isolation Panel [NHANES]    Frequency of Communication with Friends and Family: Three times a week    Frequency of Social Gatherings with Friends and Family: Three times a week    Attends Religious Services: Never    Active Member of Clubs or Organizations: No    Attends Banker Meetings: Never    Marital Status: Married  Catering manager Violence: Not At Risk (01/19/2022)   Humiliation, Afraid,  Rape, and Kick questionnaire    Fear of Current or Ex-Partner: No    Emotionally Abused: No    Physically Abused: No    Sexually Abused: No      PHYSICAL EXAM  Vitals:   04/26/23 1101 04/26/23 1130  BP: (!) 183/78 (!) 192/82  Pulse: 61 62  Weight: 153 lb (69.4 kg)   Height: 5\' 6"  (1.676 m)     Body mass index is 24.69 kg/m.  Generalized: Well developed, in no acute distress  Chest: Lungs clear to auscultation bilaterally  Neurological examination  Mentation: Alert oriented to time, place, history taking. Follows all commands speech and language fluent Cranial nerve II-XII: Facial Symmetry noted   DIAGNOSTIC DATA (LABS, IMAGING, TESTING) - I reviewed patient records, labs, notes, testing and imaging myself where available.  Lab Results  Component Value Date   WBC 9.3 12/30/2022   HGB 10.4 (L) 12/30/2022   HCT 31.7 (L) 12/30/2022   MCV 95.5 12/30/2022   PLT 297 12/30/2022      Component Value Date/Time   NA 143 01/22/2023 1144   NA 140 03/15/2022 0000   K 3.7 01/22/2023 1144   CL 104 01/22/2023 1144   CO2 28 01/22/2023 1144   GLUCOSE 118 (H) 01/22/2023 1144   BUN 24 (H) 01/22/2023 1144   BUN 34 (A) 03/15/2022 0000   CREATININE 1.20 01/22/2023 1144   CREATININE 1.74 (H) 06/29/2020 1023   CALCIUM 9.8 01/22/2023 1144   PROT 7.2  12/30/2022 1057   ALBUMIN 3.5 12/30/2022 1057   AST 23 12/30/2022 1057   ALT 8 12/30/2022 1057   ALKPHOS 69 12/30/2022 1057   BILITOT 0.9 12/30/2022 1057   GFRNONAA >60 12/30/2022 1057   GFRNONAA 73 11/25/2012 1103   GFRAA 51 (L) 03/05/2020 0334   GFRAA 84 11/25/2012 1103   Lab Results  Component Value Date   CHOL 125 01/22/2023   HDL 44.70 01/22/2023   LDLCALC 55 01/22/2023   TRIG 125.0 01/22/2023   CHOLHDL 3 01/22/2023   Lab Results  Component Value Date   HGBA1C 5.6 01/22/2023   Lab Results  Component Value Date   VITAMINB12 605 03/04/2020   Lab Results  Component Value Date   TSH 4.85 01/22/2023      ASSESSMENT AND PLAN 82 y.o. year old male  has a past medical history of Allergic rhinitis (02/02/2016), Anemia (12/17/2009), Aortic atherosclerosis (HCC) (03/05/2020), Arthritis, Asymmetric SNHL (sensorineural hearing loss) (02/29/2016), Atypical chest pain (11/24/2015), Benign essential hypertension (05/13/2009), Benign paroxysmal positional vertigo (09/14/2021), 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HCC) (07/22/2021), Body mass index (BMI) 25.0-25.9, adult (11/02/2020), Cancer (HCC), Cardiac murmur (07/25/2021), Carotid stenosis, Carpal tunnel syndrome (01/24/2022), Cervical myelopathy (HCC) (11/24/2020), Complex sleep apnea syndrome (03/25/2018), Complication of anesthesia, COPD (chronic obstructive pulmonary disease) (HCC), CPAP (continuous positive airway pressure) dependence (03/25/2018), Cranial nerve IV palsy (02/02/2016), Cystoid macular edema of both eyes (07/28/2020), Degenerative disc disease, lumbar (06/30/2015), Deviated nasal septum (02/18/2015), Diverticulosis (03/03/2020), DJD (degenerative joint disease) of hip (12/24/2012), Dyspnea (06/12/2011), Emphysema, unspecified (HCC) (03/05/2020), Erectile dysfunction (06/20/2017), Exudative age-related macular degeneration of right eye with active choroidal neovascularization (HCC) (02/09/2021), Gait disorder (03/02/2021), GERD  (gastroesophageal reflux disease), 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), Greater trochanteric pain syndrome (07/22/2021), Hand pain, left (01/16/2022), Hearing loss (02/02/2016), Heme positive stool (02/29/2020), HIATAL HERNIA (05/13/2009), High risk medication use (01/18/2012), Hip pain (03/02/2021), History of chicken pox, History of COVID-19 (12/05/2021), History of hemorrhoids, History of kidney stones, History of skin cancer (07/22/2021), History of total bilateral knee replacement (07/08/2020), Hyperglycemia (03/03/2014), Hyperlipidemia  with target LDL less than 130 (12/27/2010), Hypertension, Hypokalemia (07/08/2021), Hypothyroidism, Internal hemorrhoids (03/03/2020), Leg cramps (11/13/2019), Lower GI bleed (12/12/2012), Lumbago of lumbar region with sciatica (10/21/2020), Lumbar radiculopathy (03/02/2021), Lumbar spondylosis (11/02/2020), NEPHROLITHIASIS (05/13/2009), Nonspecific abnormal electrocardiogram (ECG) (EKG) (01/06/2013), Numbness of hand (01/24/2022), Onychomycosis (06/30/2015), Osteoarthrosis, hand (06/13/2010), Osteoarthrosis, unspecified whether generalized or localized, pelvic region and thigh (07/25/2010), Other abnormal glucose (07/22/2021), Other allergic rhinitis (02/18/2015), Palpitations (07/25/2021), Peripheral neuropathy (06/19/2016), Preventative health care (11/24/2015), Right groin pain (11/29/2012), Right inguinal hernia (05/13/2009), RLS (restless legs syndrome) (02/18/2015), Rosacea, Rotator cuff tear (07/27/2013), Situational depression (03/02/2021), Skin lesion (01/16/2022), Sleep apnea with use of continuous positive airway pressure (CPAP) (07/15/2013), Spondylitis (HCC) (07/22/2021), Status post cervical spinal fusion (07/09/2020), 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 here with:  OSA on CPAP  - CPAP compliance excellent - Residual AHI slightly elevated most likely due to leak - encouraged patient to change out supplies regularly.  - Encourage patient to use CPAP nightly and > 4 hours each night - F/U in 1 year or sooner if needed     Butch Penny, MSN, NP-C 04/26/2023, 10:28 AM [facility] [address], [address] [place], [place] [address] [phone]

## 2023-04-26 ENCOUNTER — Emergency Department (HOSPITAL_BASED_OUTPATIENT_CLINIC_OR_DEPARTMENT_OTHER): Payer: Medicare HMO

## 2023-04-26 ENCOUNTER — Telehealth: Payer: Self-pay | Admitting: Family

## 2023-04-26 ENCOUNTER — Encounter: Payer: Self-pay | Admitting: Adult Health

## 2023-04-26 ENCOUNTER — Encounter (INDEPENDENT_AMBULATORY_CARE_PROVIDER_SITE_OTHER): Payer: Self-pay

## 2023-04-26 ENCOUNTER — Other Ambulatory Visit: Payer: Self-pay

## 2023-04-26 ENCOUNTER — Encounter (HOSPITAL_BASED_OUTPATIENT_CLINIC_OR_DEPARTMENT_OTHER): Payer: Self-pay | Admitting: Emergency Medicine

## 2023-04-26 ENCOUNTER — Ambulatory Visit: Payer: Medicare HMO | Admitting: Physical Therapy

## 2023-04-26 ENCOUNTER — Emergency Department (HOSPITAL_BASED_OUTPATIENT_CLINIC_OR_DEPARTMENT_OTHER)
Admission: EM | Admit: 2023-04-26 | Discharge: 2023-04-26 | Disposition: A | Discharge status: Home / Self Care | Payer: Medicare HMO | Attending: Emergency Medicine | Admitting: Emergency Medicine

## 2023-04-26 ENCOUNTER — Ambulatory Visit: Payer: Medicare HMO | Admitting: Adult Health

## 2023-04-26 VITALS — BP 192/82 | HR 62 | Ht 66.0 in | Wt 153.0 lb

## 2023-04-26 DIAGNOSIS — R531 Weakness: Secondary | ICD-10-CM | POA: Insufficient documentation

## 2023-04-26 DIAGNOSIS — M25551 Pain in right hip: Secondary | ICD-10-CM | POA: Diagnosis not present

## 2023-04-26 DIAGNOSIS — Z7982 Long term (current) use of aspirin: Secondary | ICD-10-CM | POA: Diagnosis not present

## 2023-04-26 DIAGNOSIS — Z79899 Other long term (current) drug therapy: Secondary | ICD-10-CM | POA: Diagnosis not present

## 2023-04-26 DIAGNOSIS — Z96643 Presence of artificial hip joint, bilateral: Secondary | ICD-10-CM | POA: Diagnosis not present

## 2023-04-26 DIAGNOSIS — I10 Essential (primary) hypertension: Secondary | ICD-10-CM | POA: Diagnosis not present

## 2023-04-26 DIAGNOSIS — I771 Stricture of artery: Secondary | ICD-10-CM | POA: Diagnosis not present

## 2023-04-26 DIAGNOSIS — M25552 Pain in left hip: Secondary | ICD-10-CM | POA: Diagnosis not present

## 2023-04-26 DIAGNOSIS — G4733 Obstructive sleep apnea (adult) (pediatric): Secondary | ICD-10-CM

## 2023-04-26 DIAGNOSIS — J449 Chronic obstructive pulmonary disease, unspecified: Secondary | ICD-10-CM | POA: Insufficient documentation

## 2023-04-26 DIAGNOSIS — R42 Dizziness and giddiness: Secondary | ICD-10-CM | POA: Diagnosis not present

## 2023-04-26 LAB — URINALYSIS, ROUTINE W REFLEX MICROSCOPIC
Bilirubin Urine: NEGATIVE
Glucose, UA: NEGATIVE mg/dL
Hgb urine dipstick: NEGATIVE
Ketones, ur: NEGATIVE mg/dL
Leukocytes,Ua: NEGATIVE
Nitrite: NEGATIVE
Protein, ur: 100 mg/dL — AB
Specific Gravity, Urine: >=1.03 (ref 1.005–1.030)
pH: 6 (ref 5.0–8.0)

## 2023-04-26 LAB — BASIC METABOLIC PANEL
Anion gap: 12 (ref 5–15)
BUN: 28 mg/dL — ABNORMAL HIGH (ref 8–23)
CO2: 26 mmol/L (ref 22–32)
Calcium: 9.1 mg/dL (ref 8.9–10.3)
Chloride: 101 mmol/L (ref 98–111)
Creatinine, Ser: 1.4 mg/dL — ABNORMAL HIGH (ref 0.61–1.24)
GFR, Estimated: 50 mL/min — ABNORMAL LOW (ref >60)
Glucose, Bld: 147 mg/dL — ABNORMAL HIGH (ref 70–99)
Potassium: 3.6 mmol/L (ref 3.5–5.1)
Sodium: 139 mmol/L (ref 135–145)

## 2023-04-26 LAB — CBC
HCT: 38.6 % — ABNORMAL LOW (ref 39.0–52.0)
Hemoglobin: 12.5 g/dL — ABNORMAL LOW (ref 13.0–17.0)
MCH: 32.1 pg (ref 26.0–34.0)
MCHC: 32.4 g/dL (ref 30.0–36.0)
MCV: 99.2 fL (ref 80.0–100.0)
Platelets: 322 10*3/uL (ref 150–400)
RBC: 3.89 MIL/uL — ABNORMAL LOW (ref 4.22–5.81)
RDW: 13.6 % (ref 11.5–15.5)
WBC: 7.8 10*3/uL (ref 4.0–10.5)
nRBC: 0 % (ref 0.0–0.2)

## 2023-04-26 LAB — URINALYSIS, MICROSCOPIC (REFLEX): Bacteria, UA: NONE SEEN

## 2023-04-26 LAB — CBG MONITORING, ED: Glucose-Capillary: 135 mg/dL — ABNORMAL HIGH (ref 70–99)

## 2023-04-26 NOTE — ED Notes (Signed)
Pt not able to void at this time.

## 2023-04-26 NOTE — Patient Instructions (Signed)
Continue using CPAP nightly and greater than 4 hours each night Change out supplies regularly If your symptoms worsen or you develop new symptoms please let us know.

## 2023-04-26 NOTE — ED Provider Notes (Signed)
[place] [facility] Provider Note   CSN: [id number] Arrival date & time: 04/26/23  1716     History  Chief Complaint  Patient presents with   Dizziness    [name] Pratt is a 82 y.o. male.  Patient was seen by [facility] neurology this morning for his sleep apnea.  Patient got home and was sitting for a period of time that often causes bilateral leg weakness for him and discomfort.  It was difficult for him to get up.  Patient was sitting in a hot garage.  But it was ventilated he also felt a little strange in the head.  He was sweating.  He went home rested was again in a chair when he went to get up he felt as if his legs were little weak but he walked in here fine with his walker.  It was bilateral it was an isolated there was no upper extremity there was no visual changes there really was no vertigo or any true dizziness.  There were no speech problems.  Patient contacted his primary care doctor he is treated for blood pressure on medications.  It has been reduced recently because his blood sugar got too low.  But he is still taking some.  He contacted his primary care office.  Describes symptoms.  They called him back and said that maybe he should get evaluated.  Blood pressure was quite high [place] neurology upon arrival here was 158/77.  We did get 1 that was around 170 systolic here.  Based on just 1 day reading though would recommend that it be trended.  Patient without headache without chest pain without shortness of breath no real stroke symptoms.  The weakness in his legs was kind of related to bilateral hip pain.  And it was bilateral.  I do not think it was truly a focal deficit.  Patient's main concern was for the blood pressure being high that was what he was mostly concerned about.  Past medical history is significant for COPD hypertension sleep apnea gastroesophageal reflux disease.  History of carotid stenosis.  Diagnosed with gait  disorder in 2022 no history of any prior strokes.  History of restless leg syndrome and 2016.  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  Lumbar laminectomy in April 2024.       Home Medications Prior to Admission medications   Medication Sig Start Date End Date Taking? Authorizing Provider  acetaminophen (TYLENOL) 500 MG tablet Take 500 mg by mouth 2 (two) times daily.    [provider]  albuterol (VENTOLIN HFA) 108 (90 Base) MCG/ACT inhaler INHALE 2 PUFFS INTO THE LUNGS EVERY 6 HOURS AS NEEDED FOR SHORTNESS OF BREATH. Patient taking differently: Inhale 2 puffs into the lungs every 6 (six) hours as needed for shortness of breath. 10/20/21   [name], NP  allopurinol (ZYLOPRIM) 100 MG tablet TAKE 1 TABLET (100 MG TOTAL) BY MOUTH 2 (TWO) TIMES DAILY. 01/09/23   [name], NP  amLODipine (NORVASC) 10 MG tablet Take by mouth. 02/16/10   [provider]  antiseptic oral rinse (BIOTENE) LIQD 15 mLs by Mouth Rinse route 2 (two) times daily.    [provider]  aspirin EC 81 MG tablet Take 81 mg by mouth at bedtime.    [provider]  bacitracin ointment Apply 1 Application topically 2 (two) times daily. Patient taking differently: Apply 1 Application topically daily  as needed for wound care. 09/15/22   [name], [name], DO  cholestyramine Lanetta Inch) 4 GM/DOSE powder Take by mouth daily. 30 ml    [provider]  cycloSPORINE (RESTASIS) 0.05 % ophthalmic emulsion Place 1 drop into both eyes 2 (two) times daily.    [provider]  docusate sodium (COLACE) 100 MG capsule Take 1 capsule (100 mg total) by mouth every 12 (twelve) hours. 12/30/22   Renne Crigler, PA-C  fluticasone (FLONASE) 50 MCG/ACT nasal spray Place 1 spray into both nostrils daily as needed for allergies.    [provider]  gabapentin (NEURONTIN) 300 MG capsule Take 300  mg by mouth at bedtime. 12/02/19   [provider]  GEMTESA 75 MG TABS Take 75 mg by mouth daily. 10/20/21   [provider]  glucose blood test strip TRUE Metrix Blood Glucose Test Strip, use as instructed 11/12/20   [name], NP  hydrALAZINE (APRESOLINE) 50 MG tablet TAKE 1 TABLET (50 MG TOTAL) BY MOUTH 2 (TWO) TIMES DAILY. 02/26/23   [name], NP  Iron, Ferrous Sulfate, 325 (65 Fe) MG TABS Take 325 mg by mouth every other day. Patient taking differently: Take 325 mg by mouth daily. 08/10/22   [name], NP  Lancets MISC 1 each by Does not apply route 2 (two) times daily. TRUE matrix lancets 11/12/20   [name], NP  levothyroxine (SYNTHROID) 150 MCG tablet TAKE 1 TABLET ON 6 DAYS PER WEEK (TAKE 75 MCG ONE TIME PER WEEK) 12/03/22   [name], NP  levothyroxine (SYNTHROID) 75 MCG tablet Take 75 mcg once a week (patient on 150 mcg 6 days a week) Dispense #24 for 90 day supply. Patient taking differently: Take 75 mcg once a week (patient on 150 mcg 6 days a week) Dispense #24 for 90 day supply. Sunday 08/07/22   [name], NP  loratadine (CLARITIN) 10 MG tablet Take 10 mg by mouth daily as needed for allergies or rhinitis.    [provider]  losartan (COZAAR) 100 MG tablet TAKE 1 TABLET EVERY DAY 01/09/23   [name], NP  Magnesium 500 MG TABS Take 500 mg by mouth daily.    [provider]  Menthol, Topical Analgesic, (ICY HOT EX) Apply 1 Application topically at bedtime. Lidocaine / on back    [provider]  metroNIDAZOLE (METROCREAM) 0.75 % cream Apply 1 Application topically daily as needed (head).    [provider]  Multiple Vitamins-Minerals (MULTIVITAMIN WITH MINERALS) tablet Take 1 tablet by mouth daily.    [provider]  multivitamin-lutein (OCUVITE-LUTEIN) CAPS capsule Take 1 capsule by mouth daily.    [provider]  nitroGLYCERIN (NITROSTAT) 0.4 MG  SL tablet Place 1 tablet (0.4 mg total) under the tongue every 5 (five) minutes as needed for chest pain. 09/16/21   Wendall Stade, MD  OVER THE COUNTER MEDICATION Place 1 spray into both nostrils 2 (two) times daily. Sinus saline    [provider]  OVER THE COUNTER MEDICATION Take 15 mLs by mouth at bedtime. Yellow Mustard    [provider]  polyethylene glycol powder (GLYCOLAX/MIRALAX) 17 GM/SCOOP powder Take 17 g by mouth 2 (two) times daily as needed for mild constipation, moderate constipation or severe constipation. 12/30/22   Renne Crigler, PA-C  polyvinyl alcohol (ARTIFICIAL TEARS) 1.4 % ophthalmic solution Place 1 drop into both eyes daily as needed for dry eyes.    [provider]  PREBIOTIC PRODUCT PO Take 1 tablet  by mouth daily.    [provider]  PRESCRIPTION MEDICATION CPAP- At bedtime and during any time of rest    [provider]  Probiotic Product (PROBIOTIC DAILY PO) Take 1 capsule by mouth daily as needed (Constipation).    [provider]  rosuvastatin (CRESTOR) 5 MG tablet TAKE 1 TABLET EVERY DAY 01/09/23   [name], NP  vitamin B-12 (CYANOCOBALAMIN) 500 MCG tablet Take 500 mcg by mouth in the morning.    [provider]      Allergies    Pravastatin, Sildenafil, Oxycodone, Atenolol, and Metoclopramide hcl    Review of Systems   Review of Systems  Constitutional:  Negative for chills and fever.  HENT:  Negative for ear pain and sore throat.   Eyes:  Negative for pain and visual disturbance.  Respiratory:  Negative for cough and shortness of breath.   Cardiovascular:  Negative for chest pain and palpitations.  Gastrointestinal:  Negative for abdominal pain and vomiting.  Genitourinary:  Negative for dysuria and hematuria.  Musculoskeletal:  Negative for arthralgias and back pain.  Skin:  Negative for color change and rash.  Neurological:  Positive for weakness and light-headedness. Negative for  dizziness, seizures, syncope, facial asymmetry, speech difficulty, numbness and headaches.  All other systems reviewed and are negative.   Physical Exam Updated Vital Signs BP (!) 176/73   Pulse (!) 59   Temp (!) 97.4 F (36.3 C) (Oral)   Resp 18   Ht 1.676 m (5\' 6" )   Wt 69.4 kg   SpO2 99%   BMI 24.69 kg/m  Physical Exam Vitals and nursing note reviewed.  Constitutional:      General: He is not in acute distress.    Appearance: Normal appearance. He is well-developed.  HENT:     Head: Normocephalic and atraumatic.  Eyes:     Extraocular Movements: Extraocular movements intact.     Conjunctiva/sclera: Conjunctivae normal.     Pupils: Pupils are equal, round, and reactive to light.  Cardiovascular:     Rate and Rhythm: Normal rate and regular rhythm.     Heart sounds: No murmur heard. Pulmonary:     Effort: Pulmonary effort is normal. No respiratory distress.     Breath sounds: Normal breath sounds.  Abdominal:     Palpations: Abdomen is soft.     Tenderness: There is no abdominal tenderness.  Musculoskeletal:        General: No swelling.     Cervical back: Normal range of motion and neck supple.     Right lower leg: No edema.     Left lower leg: No edema.  Skin:    General: Skin is warm and dry.     Capillary Refill: Capillary refill takes less than 2 seconds.  Neurological:     General: No focal deficit present.     Mental Status: He is alert and oriented to person, place, and time.     Cranial Nerves: No cranial nerve deficit.     Sensory: No sensory deficit.     Motor: No weakness.     Coordination: Coordination normal.     Comments: Patient without any focal neurodeficits.  Psychiatric:        Mood and Affect: Mood normal.     ED Results / Procedures / Treatments   Labs (all labs ordered are listed, but only abnormal results are displayed) Labs Reviewed  BASIC METABOLIC PANEL - Abnormal; Notable for the following components:  Result Value    Glucose, Bld 147 (*)    BUN 28 (*)    Creatinine, Ser 1.40 (*)    GFR, Estimated 50 (*)    All other components within normal limits  CBC - Abnormal; Notable for the following components:   RBC 3.89 (*)    Hemoglobin 12.5 (*)    HCT 38.6 (*)    All other components within normal limits  URINALYSIS, ROUTINE W REFLEX MICROSCOPIC - Abnormal; Notable for the following components:   Protein, ur 100 (*)    All other components within normal limits  CBG MONITORING, ED - Abnormal; Notable for the following components:   Glucose-Capillary 135 (*)    All other components within normal limits  URINALYSIS, MICROSCOPIC (REFLEX)  CBG MONITORING, ED    EKG EKG Interpretation Date/Time:  Thursday April 26 2023 21:50:27 EDT Ventricular Rate:  61 PR Interval:  176 QRS Duration:  156 QT Interval:  490 QTC Calculation: 494 R Axis:   -68  Text Interpretation: Sinus rhythm Right bundle branch block LVH with IVCD and secondary repol abnrm Borderline prolonged QT interval no sig Confirmed by Vanetta Mulders [phone]) on 04/26/2023 10:09:55 PM  Radiology DG Chest Port 1 View  Result Date: 04/26/2023 CLINICAL DATA:  Dizziness and weakness with hypertension, initial encounter EXAM: PORTABLE CHEST 1 VIEW COMPARISON:  12/06/2022 FINDINGS: Cardiac shadow is within normal limits. Tortuous thoracic aorta is noted with calcification. The lungs are clear bilaterally. No bony abnormality is noted. IMPRESSION: No active disease. Electronically Signed   By: Alcide Clever M.D.   On: 04/26/2023 23:14    Procedures Procedures    Medications Ordered in ED Medications - No data to display  ED Course/ Medical Decision Making/ A&P                                 Medical Decision Making Amount and/or Complexity of Data Reviewed Radiology: ordered.   Patient really not with anything that sounds as if it was strokelike.  Blood pressure is elevated here and will need to trend that.  And then follow-up with  primary care doctor to see if they want to adjust his current blood pressure medicines.  Will make no adjustments here tonight.  Blood sugar was 135 basic metabolic panel GFR 50 creatinine 1.4 may be some mild dehydration and some mild renal insufficiency can have that also followed up by his primary care doctor.  CBC white count 7.8 hemoglobin 12.4 platelets 322.  Urinalysis negative for urinary tract infection.  Portable chest x-ray formal results are still pending.  But reviewed by me no acute process.  Actually formal chest x-ray results just came back no acute abnormalities.  EKG also without any acute abnormalities.  Patient with persistent right bundle branch block.   Final Clinical Impression(s) / ED Diagnoses Final diagnoses:  Primary hypertension    Rx / DC Orders ED Discharge Orders     None         Vanetta Mulders, MD 04/26/23 2322

## 2023-04-26 NOTE — Telephone Encounter (Signed)
Spoke with triage. They advised patient to go to the ED. Pt refused. Triage states she will call the patient back and inform him that we agreed with the ED recommendation.

## 2023-04-26 NOTE — ED Notes (Signed)
X-ray at bedside

## 2023-04-26 NOTE — Discharge Instructions (Addendum)
Return for any new or worse symptoms.  Return for severe headache return for chest pain shortness of breath.  Return for any strokelike symptoms to include upper extremity or lower extremity weakness visual changes speech problems.  Check your blood pressure daily and keep a log of it.  Contact your primary care doctor for follow-up of the blood pressure.  If it remains like today you will probably need to have some of that adjusted to help control your blood pressure better.  But it will depend on the trending.

## 2023-04-26 NOTE — ED Triage Notes (Signed)
Pt states he was at his neurologist office today and had some dizziness and weakness around 1030 this am.  Pt was told his BP was high at the office.  Pt went home and symptoms somewhat improved.  Pt feels better now.  Pt had spoke to a triage nurse and was told to come to ED around 1430.

## 2023-04-26 NOTE — Telephone Encounter (Signed)
FYI: This call has been transferred to triage nurse: the Triage Nurse. Once the result note has been entered staff can address the message at that time.  Patient called in with the following symptoms:  Red Word:loss of consciousness ( passed out) , elevated blood pressure, and Weakness   Please advise at Mobile [phone] (mobile)  Message is routed to Provider Pool.

## 2023-04-27 NOTE — Telephone Encounter (Signed)
Nevermind- I see now that pt went to the ED already.

## 2023-04-30 ENCOUNTER — Encounter: Payer: Self-pay | Admitting: Physical Therapy

## 2023-04-30 ENCOUNTER — Ambulatory Visit: Payer: Medicare HMO | Admitting: Physical Therapy

## 2023-04-30 DIAGNOSIS — G8929 Other chronic pain: Secondary | ICD-10-CM

## 2023-04-30 DIAGNOSIS — R2689 Other abnormalities of gait and mobility: Secondary | ICD-10-CM

## 2023-04-30 DIAGNOSIS — R2681 Unsteadiness on feet: Secondary | ICD-10-CM

## 2023-04-30 DIAGNOSIS — R296 Repeated falls: Secondary | ICD-10-CM

## 2023-04-30 DIAGNOSIS — M6281 Muscle weakness (generalized): Secondary | ICD-10-CM

## 2023-04-30 DIAGNOSIS — M5459 Other low back pain: Secondary | ICD-10-CM

## 2023-04-30 NOTE — Therapy (Addendum)
OUTPATIENT PHYSICAL THERAPY TREATMENT     Patient Name: [name] Pratt MRN: [id number] DOB:Jul 08, 1941, 82 y.o., male Today's Date: 04/30/2023   END OF SESSION:  PT End of Session - 04/30/23 1445     Visit Number 9   Non-billable visit   Date for PT Re-Evaluation 05/08/23    Authorization Type [name]    Authorization Time Period 03/13/23 - 05/08/23    Authorization - Visit Number --    Authorization - Number of Visits 10    Progress Note Due on Visit 10    PT Start Time 1445    PT Stop Time 1519    PT Time Calculation (min) 34 min    Activity Tolerance Treatment limited secondary to medical complications (Comment)   Elevated BP   Behavior During Therapy [facility] for tasks assessed/performed                   Past Medical History:  Diagnosis Date   Allergic rhinitis 02/02/2016   Anemia 12/17/2009   Formatting of this note might be different from the original. 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 Hypertension   Benign paroxysmal positional vertigo 09/14/2021   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HCC) 07/22/2021   Body mass index (BMI) 25.0-25.9, adult 11/02/2020   Cancer (HCC)    skin cancer   Cardiac murmur 07/25/2021   Carotid stenosis    a. Carotid U/S 5/13: LICA < 50%, RICA 50-69%;  b.  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    Cystoid macular edema of both eyes 07/28/2020   Degenerative disc disease, lumbar 06/30/2015   Deviated nasal septum 02/18/2015   Diverticulosis 03/03/2020   DJD (degenerative joint disease) of hip 12/24/2012   Dyspnea 06/12/2011   Emphysema, unspecified (HCC) 03/05/2020   Erectile dysfunction 06/20/2017   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   Gait disorder 03/02/2021   GERD (gastroesophageal reflux disease)    GOUT 05/13/2009   Qualifier: Diagnosis of  By: Kem Parkinson     Greater trochanteric pain syndrome 07/22/2021   Hand pain, left 01/16/2022   Hearing loss 02/02/2016   Heme positive stool 02/29/2020   HIATAL HERNIA 05/13/2009   Qualifier: Diagnosis of  By: Kem Parkinson     High risk medication use 01/18/2012   Hip pain 03/02/2021   History of chicken pox    History of COVID-19 12/05/2021   History of hemorrhoids    History of kidney stones    History of skin cancer 07/22/2021   skin cancer   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 Cardiology - Dr Estrella Myrtle at [facility] cardiology  ICD-10 cut over   Hypertension  under control   Hypokalemia 07/08/2021   Hypothyroidism    Internal hemorrhoids 03/03/2020   Leg cramps 11/13/2019   Lower GI bleed 12/12/2012   Lumbago of lumbar region with sciatica 10/21/2020   Lumbar radiculopathy 03/02/2021   Lumbar spondylosis 11/02/2020   NEPHROLITHIASIS 05/13/2009   Qualifier: Diagnosis of  By: Kem Parkinson     Nonspecific abnormal electrocardiogram (ECG) (EKG) 01/06/2013   Numbness of hand 01/24/2022   Onychomycosis 06/30/2015   Osteoarthrosis, 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 Osteoarthritis Of The Hand  10/1 IMO update    Osteoarthrosis, unspecified whether generalized or localized, pelvic region and thigh 07/25/2010   Formatting of this note might be different from the original. Osteoarthritis Of The Hip   Other abnormal glucose 07/22/2021   Other allergic rhinitis 02/18/2015   Palpitations 07/25/2021   Peripheral neuropathy 06/19/2016   Preventative health care 11/24/2015   Right groin pain 11/29/2012   Right inguinal hernia 05/13/2009   Qualifier: Diagnosis of  By: Kem Parkinson     RLS (restless legs syndrome) 02/18/2015   Rosacea    Rotator cuff tear 07/27/2013   Situational depression 03/02/2021   Skin lesion 01/16/2022   Sleep apnea with use of continuous positive airway pressure (CPAP) 07/15/2013   CPAP set to  7 cm water,  Residual AHi was 7.8  With no leak.  User time 6 hours.  479 days , not used only 12 days - highly compliant 06-23-13  .    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 Fawn Kirk   Ulnar neuropathy 01/24/2022   Urinary retention 11/26/2020   Ventral hernia 07/08/2012   Vertigo 07/08/2021   Weight loss 03/03/2020   Past Surgical History:  Procedure Laterality Date   ABDOMINAL HERNIA REPAIR  07/15/12   Dr Ashley Jacobs   ANTERIOR CERVICAL DECOMP/DISCECTOMY FUSION N/A 07/09/2020   Procedure: ANTERIOR CERVICAL DECOMPRESSION AND FUSION CERVICAL THREE-FOUR.;  Surgeon: Donalee Citrin, MD;  Location: [facility] OR;  Service: Neurosurgery;  Laterality: N/A;  anterior   BROW LIFT  05/07/01   COLONOSCOPY WITH PROPOFOL N/A 04/24/2022   Procedure: COLONOSCOPY WITH PROPOFOL;  Surgeon: Tressia Danas, MD;  Location: [facility] ENDOSCOPY;  Service: Gastroenterology;  Laterality: N/A;   COLONOSCOPY WITH PROPOFOL N/A 04/26/2022    Procedure: COLONOSCOPY WITH PROPOFOL;  Surgeon: Tressia Danas, MD;  Location: [facility] ENDOSCOPY;  Service: Gastroenterology;  Laterality: N/A;   CYSTOSCOPY WITH URETEROSCOPY AND STENT PLACEMENT Right 01/28/2014   Procedure: CYSTOSCOPY WITH URETEROSCOPY, BASKET RETRIVAL AND  STENT PLACEMENT;  Surgeon: Valetta Fuller, MD;  Location: [facility] ORS;  Service: Urology;  Laterality: Right;   epidural injections     multiple procedures   ESOPHAGEAL DILATION  1993 and 1994   multiple times   EYE SURGERY Bilateral 03/25/10, 2012   cataract removal   HEMORRHOID SURGERY  2014   HIATAL HERNIA REPAIR  12/21/93   HOLMIUM LASER APPLICATION Right 01/28/2014   Procedure: HOLMIUM LASER APPLICATION;  Surgeon: Valetta Fuller, MD;  Location: [facility] ORS;  Service: Urology;  Laterality: Right;   INGUINAL HERNIA REPAIR Right 07/15/12   Dr Ashley Jacobs, x2   JOINT REPLACEMENT  07/25/04   right knee   JOINT REPLACEMENT  07/13/10   left knee   KNEE SURGERY  1995   LAPAROSCOPIC CHOLECYSTECTOMY  09/20/06   with intraoperative cholangiogram and right inguinal herniorrhaphy with mesh    LIGAMENT REPAIR Left 07/2013   shoulder   LITHOTRIPSY     x2   LUMBAR LAMINECTOMY/DECOMPRESSION MICRODISCECTOMY Bilateral 12/27/2022   Procedure: Lumbar Two-Three, Lumbar Three-Four Sublaminar Decompression;  Surgeon: Donalee Citrin, MD;  Location: [facility] OR;  Service: Neurosurgery;  Laterality: Bilateral;   NASAL SEPTUM SURGERY  1975   POLYPECTOMY  04/26/2022   Procedure: POLYPECTOMY;  Surgeon: Tressia Danas, MD;  Location: [facility] ENDOSCOPY;  Service: Gastroenterology;;   POSTERIOR CERVICAL FUSION/FORAMINOTOMY N/A 11/24/2020   Procedure: Posterior Cervical Laminectomy Cervical three-four, Cervical four-five with lateral mass fusion;  Surgeon: Donalee Citrin, MD;  Location: [facility] OR;  Service: Neurosurgery;  Laterality: N/A;   REFRACTIVE SURGERY Bilateral    Right total hip replacement  4/14   SKIN CANCER DESTRUCTION     nose, ear   TONSILLECTOMY  as child   TOTAL  HIP ARTHROPLASTY Left    URETHRAL DILATION  1991   Dr. Annabell Howells   Patient Active Problem List   Diagnosis Date Noted   Spinal stenosis of lumbar region 12/27/2022   Drowsiness 12/07/2022   Preop examination 12/06/2022   Weakness of lower extremity 10/10/2022   Skin tear of right elbow without complication 09/18/2022   Skin tear of left lower leg without complication 09/18/2022   Fall 09/18/2022   Iron deficiency anemia 09/18/2022   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   Vitamin D deficiency 08/07/2022   Closed fracture of left hip (HCC) 08/07/2022   Hand weakness 05/02/2022   Acute kidney injury (HCC) 05/02/2022   Adenomatous polyp of ascending colon    Adenomatous polyp of colon    Acute metabolic encephalopathy 04/24/2022   GI bleed 04/21/2022   Vasovagal syncope    Hemorrhagic shock (HCC)    Hematochezia    Irregular heart beat 02/03/2022   Constipation 02/03/2022   Carpal tunnel syndrome 01/24/2022   Numbness of hand 01/24/2022   Ulnar neuropathy 01/24/2022   Skin lesion 01/16/2022   History of COVID-19 12/05/2021   Benign paroxysmal positional vertigo 09/14/2021   Palpitations 07/25/2021   Cardiac murmur 07/25/2021   History of chicken pox 07/22/2021   History of hemorrhoids 07/22/2021   Hypertension 07/22/2021   COPD (chronic obstructive pulmonary disease) (HCC) 07/22/2021   History of kidney stones 07/22/2021   History of skin cancer 07/22/2021   Complication of anesthesia 07/22/2021   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HCC) 07/22/2021   Other abnormal glucose 07/22/2021   Spondylitis (HCC) 07/22/2021   Greater trochanteric pain syndrome 07/22/2021   Vertigo 07/08/2021   Hypokalemia 07/08/2021   Trochanteric bursitis of left hip 06/30/2021   Cancer (HCC) 04/11/2021   Lumbar radiculopathy 03/02/2021   Gait instability 03/02/2021   Hip pain 03/02/2021   Situational depression 03/02/2021   Exudative age-related macular degeneration of right eye with active choroidal  neovascularization (HCC) 02/09/2021   Urinary retention 11/26/2020   Cervical myelopathy (HCC) 11/24/2020   Body mass index (BMI) 25.0-25.9, adult 11/02/2020   Lumbar spondylosis 11/02/2020   Lumbago of lumbar region with sciatica 10/21/2020   Cystoid macular edema of both eyes 07/28/2020   Status post cervical spinal fusion 07/09/2020   History of total bilateral knee replacement 07/08/2020   Aortic atherosclerosis (HCC) 03/05/2020   Emphysema, unspecified (HCC) 03/05/2020   Arthritis 03/04/2020   Diverticulosis 03/03/2020   Internal hemorrhoids 03/03/2020  Weight loss 03/03/2020   Heme positive stool 02/29/2020   Leg cramps 11/13/2019   Tibialis anterior tendon tear, nontraumatic 11/13/2019   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 07/29/2018   CPAP (continuous positive airway pressure) dependence 03/25/2018   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   Erectile dysfunction 06/20/2017   Stenosis of right carotid artery without cerebral infarction 03/06/2017   Peripheral neuropathy 06/19/2016   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   Hearing loss 02/02/2016   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   Allergic rhinitis 02/02/2016   Atypical chest pain 11/24/2015   Preventative health care 11/24/2015   Onychomycosis 06/30/2015   Degenerative disc disease, lumbar 06/30/2015   Other allergic rhinitis 02/18/2015   Deviated nasal septum 02/18/2015   RLS (restless legs syndrome) 02/18/2015   GERD (gastroesophageal reflux disease) 09/18/2014   Hyperglycemia 03/03/2014   Rotator cuff tear 07/27/2013   Sleep apnea with use of continuous positive airway pressure (CPAP) 07/15/2013   Carotid stenosis 02/24/2013   Nonspecific abnormal electrocardiogram (ECG) (EKG) 01/06/2013   DJD (degenerative joint disease) of hip 12/24/2012   Lower GI bleed 12/12/2012   Right groin pain 11/29/2012   OSA (obstructive sleep apnea) 11/25/2012   Ventral hernia 07/08/2012   High risk  medication use 01/18/2012   Dyspnea 06/12/2011   Hyperlipidemia 12/27/2010   Osteoarthrosis, unspecified whether generalized or localized, pelvic region and thigh 07/25/2010   Osteoarthrosis, hand 06/13/2010   Anemia 12/17/2009   Hypothyroidism 05/13/2009   GOUT 05/13/2009   Benign essential hypertension 05/13/2009   Right inguinal hernia 05/13/2009   HIATAL HERNIA 05/13/2009   NEPHROLITHIASIS 05/13/2009   ROSACEA 05/13/2009    PCP: [name], NP   REFERRING PROVIDER: Susa Raring., MD   REFERRING DIAG:  M25.562, G89.29 (ICD-10-CM) - Chronic pain in left knee  M25.561, G89.29 (ICD-10-CM) - Chronic pain in right knee   THERAPY DIAG:  Chronic pain of both knees  Muscle weakness (generalized)  Unsteadiness on feet  Other abnormalities of gait and mobility  Repeated falls  Other low back pain  RATIONALE FOR EVALUATION AND TREATMENT: Rehabilitation  ONSET DATE: off and on for a couple years  NEXT MD VISIT: none scheduled   SUBJECTIVE:                                                                                                                                                                                                         SUBJECTIVE STATEMENT: Pt reports ED visit on 04/26/23 due to elevated BP and feeling of weakness. He states he  has been checking his BP at home and it has ranged from ~102 SBP to ~172 SBP.  He reports his back has been bothering him more.  Knees don't hurt as much but are still popping.  PAIN: Are you having pain? No - B knees  Are you having pain? Yes: NPRS scale: "uncertain"/10 Pain location: midline low back extending to sides Pain description: pulling, aching Aggravating factors: walking Relieving factors: rest    PERTINENT HISTORY:  L2-3, L3-4 Sublaminar Decompression on 12/27/22; Revision of femoral component of L THA on 07/02/22 s/p fall with periprosthetic fracture; Aortic atherosclerosis; carotid stenosis; cervical  myelopathy s/p ACDF 06/2020 and posterior cervical fusion 11/2020; peripheral neuropathy; lumbar DDD/spondylosis; lumbago with sciatica/lumbar radiculopathy; OA; B TKA; B THA; L greater trochanteric pain syndrome; L RCR 2014; CTR; hearing loss; HTN; BPPV/vertigo; B sacroiliitis; skin cancer; COPD/emphysema; sleep apnea with CPAP; GERD; gout; HLD; LE cramps; RLS   PRECAUTIONS: Fall  WEIGHT BEARING RESTRICTIONS: No  FALLS:  Has patient fallen in last 6 months? Yes. 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  No fracture or bony lesion or other findings.  Impression: Right knee replacement in good condition.   01/31/23 - L knee x-ray: No fracture or bony lesion or arthritic change or other abnormality.  Left  knee replacement looks well-fixed and in excellent position.  Impression: Knee replacement in good condition.   PATIENT SURVEYS:  LEFS 31 / 80 = 38.8 %  COGNITION: Overall cognitive status: Within functional limits for tasks assessed and History of cognitive impairments - at baseline    SENSATION: Intermittent peripheral  neuropathy in B feet  MUSCLE LENGTH: TBA Hamstrings:  ITB:  Piriformis:  Hip flexors:  Quads:  Heelcord:   POSTURE:  rounded shoulders, forward head, and flexed trunk   LOWER EXTREMITY ROM: Limited B hip and ankle ROM Active ROM Right eval Left eval Right 04/03/23 Left 04/03/23  Knee flexion 104 96 110 108  Knee extension -10 -5 4 2    Passive ROM Right eval Left eval  Knee flexion    Knee extension 0 0  (Blank rows = not tested)  LOWER EXTREMITY MMT:  MMT Right Eval* Left Eval*  Hip flexion 3+ 4  Hip extension 3- 3+  Hip abduction 4- 4  Hip adduction 4 4  Hip internal rotation 3+ 4-  Hip external rotation 2+ 3-  Knee flexion 4 4  Knee extension 4 4+  Ankle dorsiflexion 4+ 4  Ankle plantarflexion    Ankle inversion    Ankle eversion     (Blank rows = not tested, *  tested in sitting)  FUNCTIONAL TESTS: (Remaining tests to be assessed next visit with PT) 5 times sit to stand: 18.94 sec w/o UE assist (uncontrolled descent on most reps) (03/22/23) Timed up and go (TUG): 21.29 sec with RW (03/22/23) 10 meter walk test: 21.13 sec with RW; Gait speed = 1.55 ft/sec  Berg Balance Scale: 35/56; <36 = High risk for falls (close to 100%) (03/22/23) Dynamic Gait Index: 11/24; Scores of 19 or less are predictive of falls in older community living adults (03/22/23)  GAIT: Distance walked: 60 ft Assistive device utilized: Environmental consultant - 2 wheeled Level of assistance: SBA Gait pattern: step through pattern, decreased stride length, decreased hip/knee flexion- Right, decreased hip/knee flexion- Left, decreased ankle dorsiflexion- Right, decreased ankle dorsiflexion- Left, trunk flexed, poor foot clearance- Right, and poor foot clearance- Left Comments: Cues necessary for improved upright posture and rolling walker proximity   TODAY'S TREATMENT:   04/30/23 BP after sitting in waiting room ~15 minutes upon arrival to PT = 170/80 HR = 67 O2 sats = 96%  BP after walking ~50 ft =  184/80 HR = 67 O2 sats = 97%  PT session discontinued due to elevated BP   04/24/23 THERAPEUTIC EXERCISE: to improve flexibility, strength and mobility.  Demonstration, verbal and tactile cues throughout for technique. NuStep L5 x 8 min Standing R/L glute med 45 kickback x 15 bil; B UE support on counter Standing R/L hip flexion + ER x 15 bil; B UE support on counter  NEUROMUSCULAR RE-EDUCATION: To improve balance, proprioception, coordination, reduce fall risk, and amplitude of movement.  Lateral weight shifts in wide BOS x 20 bil Weight shifts ant/post in staggered stance x 10 with each foot fwd B side-stepping 3 x 10 ft along counter with light UE support on counter/hovering just above counter and SBA/CGA of PT - increased difficulty advancing R foot to side even with cues to lift leg flexing hip and knee Fwd tandem gait 4 x 10 ft along counter with single UE support on counter and SBA/CGA of PT Retro gait 6 x 10 ft along counter with single UE support on counter and SBA/CGA of PT   04/17/23 THERAPEUTIC EXERCISE: to improve flexibility, strength and mobility.  Demonstration, verbal and tactile cues throughout for technique. Nustep L5x16min Supine SKTC x 15 sec bil Supine isometric hip ER with belt 10x5" Isometric hip flexion with belt 10x5" LTR 10x each way Pt needed bathroom break afterwards TUG- 20 sec  5xSTS- 18 sec   PATIENT EDUCATION:  Education details: HEP progression - LAQ and runner stretch Person educated: Patient Education method: Explanation, Demonstration, Verbal cues, and Handouts Education comprehension: verbalized understanding, returned demonstration, verbal cues required, and needs further education  HOME EXERCISE PROGRAM: Access Code: [email or web address] URL: [email or web address] Date: 03/29/2023 Prepared by: Glenetta Hew  Exercises - Seated Hamstring Stretch with Strap  - 1 x daily - 7 x weekly - 3 sets - 30 sec hold - Seated Quad Set  - 1 x  daily - 7 x weekly - 3 sets - 10 reps - 5 seconds hold - Seated Hip Adduction Squeeze with Ball  - 1 x daily - 7 x weekly - 3 sets - 10 reps - 5 secs hold - Supine Quad Set on Towel Roll  - 1 x daily - 7 x weekly - 2 sets - 10 reps - 3 sec hold - Hooklying Single Leg Bent Knee Fallouts with Resistance  - 1 x daily - 7 x weekly - 2 sets -  10 reps - 3 sec hold - Supine Bridge with Resistance Band  - 1 x daily - 7 x weekly - 2 sets - 10 reps - 5 sec hold   ASSESSMENT:  CLINICAL IMPRESSION: Damean Kelder" arrived to PT today having had a recent ED visit for elevated BP.  BP assessed while pt still in waiting room with reading elevated to 170/80 and reassessed after walking ~50 ft into clinic with reading increased to 184/80.  Further PT assessment and interventions deferred today due to elevated BP with pt instructed to f/u with his PCP regarding ongoing elevated BP.  If BP better managed on next visit, will proceed with 10th visit assessment.  OBJECTIVE IMPAIRMENTS: Abnormal gait, decreased activity tolerance, decreased balance, decreased coordination, decreased endurance, decreased knowledge of condition, decreased knowledge of use of DME, decreased mobility, difficulty walking, decreased ROM, decreased strength, decreased safety awareness, increased fascial restrictions, impaired perceived functional ability, increased muscle spasms, impaired flexibility, impaired sensation, improper body mechanics, postural dysfunction, and pain.   ACTIVITY LIMITATIONS: carrying, lifting, bending, sitting, standing, squatting, sleeping, stairs, transfers, bed mobility, bathing, locomotion level, and caring for others  PARTICIPATION LIMITATIONS: meal prep, cleaning, laundry, driving, shopping, and community activity  PERSONAL FACTORS: Age, Behavior pattern, Fitness, Past/current experiences, Time since onset of injury/illness/exacerbation, and 3+ comorbidities: L2-3, L3-4 Sublaminar Decompression on 12/27/22;  Revision of femoral component of L THA on 07/02/22 s/p fall with periprosthetic fracture; Aortic atherosclerosis; carotid stenosis; 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   REHAB POTENTIAL: Good  CLINICAL DECISION MAKING: Evolving/moderate complexity  EVALUATION COMPLEXITY: Moderate   GOALS: Goals reviewed with patient? Yes  SHORT TERM GOALS: Target date: 04/10/2023  Patient will be independent with initial HEP. Baseline:  Goal status: IN PROGRESS  2.  Complete balance testing and establish additional STG's/LTG's as appropriate. Baseline:  Goal status: MET  03/22/23  3.  Patient will improve 5x STS time to </= 16 seconds with good eccentric control for improved efficiency and safety with transfers Baseline: 18.94 sec w/o UE assist (uncontrolled descent on most reps) Goal status: IN PROGRESS  04/17/23 - 18 sec  4.  Patient will demonstrate decreased TUG time to </= 18 sec to decrease risk for falls with transitional mobility Baseline: 21.29 sec with RW Goal status: IN PROGRESS  04/17/23 - 20 sec  LONG TERM GOALS: Target date: 05/08/2023  Patient will be independent with advanced/ongoing HEP to improve outcomes and carryover.  Baseline:  Goal status: IN PROGRESS  2.  Patient will report at least 50-75% improvement in B knee pain with no further instances of subluxation to improve QOL. Baseline: 5/10 Goal status: IN PROGRESS  3.  Patient will demonstrate improved B knee extension AROM to [facility] to allow for improved stability for normal gait and stair mechanics. Baseline: Refer to above LE ROM table Goal  status: IN PROGRESS  04/03/23 - refer to ROM table  4.  Patient will demonstrate improved B LE strength to >/= 4 to 4+/5 for improved stability and ease of mobility. Baseline: Refer to above LE MMT table Goal status: IN PROGRESS  5.  Patient will be able to ambulate 400' with LRAD and normal gait pattern without increased  pain to access community.  Baseline:  Goal status: IN PROGRESS  6. Patient will be able to ascend/descend stairs with 1 HR and reciprocal step pattern safely to access home and community.  Baseline:  Goal status: IN PROGRESS  7.  Patient will report >/= 40/80 on LEFS to demonstrate improved functional ability. Baseline: 31 / 80 = 38.8 % Goal status: IN PROGRESS  8.  Patient will demonstrate at least 19/24 on DGI to decrease risk of falls. Baseline: 11/24 (03/22/23) Goal status: IN PROGRESS   9.  Patient will improve Berg score by at least 8 points to improve safety and stability with ADLs in standing and reduce risk for falls. Baseline: 35/56 (03/22/23) Goal status: IN PROGRESS   PLAN:  PT FREQUENCY: 2x/week  PT DURATION: 8 weeks  PLANNED INTERVENTIONS: Therapeutic exercises, Therapeutic activity, Neuromuscular re-education, Balance training, Gait training, Patient/Family education, Self Care, Joint mobilization, Stair training, DME instructions, Aquatic Therapy, Dry Needling, Electrical stimulation, Spinal mobilization, Cryotherapy, Moist heat, Taping, Ultrasound, and Manual therapy  PLAN FOR NEXT SESSION:  10th visit PN; L knee ROM; B LE flexibility and strengthening; balance training; review and progress HEP as indicated   Marry Guan, PT 04/30/2023, 3:24 PM

## 2023-04-30 NOTE — Telephone Encounter (Signed)
Initial Comment Caller states his blood pressure is 194/86. Translation No Nurse Assessment Nurse: [name], RN, Marjean Donna Date/Time [name] Pratt Time): 04/26/2023 3:49:56 PM Confirm and document reason for call. If symptomatic, describe symptoms. ---Caller stated he was at [facility] and he felt weak BP was taken and was in the 190's. Caller states he was told to go home and rest. Caller states his hands are numb from problems with nerve endings. Does the patient have any new or worsening symptoms? ---Yes Will a triage be completed? ---Yes Related visit to physician within the last 2 weeks? ---Yes Does the PT have any chronic conditions? (i.e. diabetes, asthma, this includes High risk factors for pregnancy, etc.) ---Yes List chronic conditions. ---Carotid artery blockage, COPD, Is this a behavioral health or substance abuse call? ---No Guidelines Guideline Title Affirmed Question Affirmed Notes Nurse Date/Time (Eastern Time) Blood Pressure - High [1] Systolic BP >= 160 OR Diastolic >= 100 AND [2] cardiac (e.g., breathing difficulty, chest pain) or neurologic symptoms (e.g., new-onset blurred [name] 04/26/2023 3:54:10 PM PLEASE NOTE: All timestamps contained within this report are represented as [place] Standard Time. CONFIDENTIALTY NOTICE: This fax transmission is intended only for the addressee. It contains information that is legally privileged, confidential or otherwise protected from use or disclosure. If you are not the intended recipient, you are strictly prohibited from reviewing, disclosing, copying using or disseminating any of this information or taking any action in reliance on or regarding this information. If you have received this fax in error, please notify us immediately by telephone so that we can arrange for its return to [place]. Phone: [phone], Toll-Free: [phone], Fax: [phone] Page: 2 of 3 Call Id: [id number] Guidelines Guideline Title Affirmed  Question Affirmed Notes Nurse Date/Time [name] Pratt Time) or double vision, unsteady gait) Disp. Time [name] Pratt Time) Disposition Final User 04/26/2023 3:16:18 PM Attempt made - message left [name] 04/26/2023 4:03:42 PM Go to ED Now Yes [name], RN, Marjean Donna Final Disposition 04/26/2023 4:03:42 PM Go to ED Now Yes [name], RN, [facility] Advice Given Per Guideline GO TO ED NOW: * You need to be seen in the Emergency Department. NOTE TO TRIAGER - DRIVING: * Another adult should drive. * Patient should not delay going to the emergency department. * If immediate transportation is not available via car, rideshare (e.g., Lyft, Uber), or taxi, then the patient should be instructed to call [id number]. CALL EMS 911 IF: * Passes out or faints * Becomes confused * Becomes too weak to stand * You become worse CARE ADVICE given per High Blood Pressure (Adult) guideline. Comments User: [name], RN Date/Time [name] Pratt Time): 04/26/2023 3:39:31 PM Primary # was reached a woman picked up stating she did not call from number in chart. User: [name], RN Date/Time [name] Pratt Time): 04/26/2023 3:46:51 PM Pt has two old numbers in the chart provided by [facility] [phone] and [phone] User: [name], RN Date/Time [name] Pratt Time): 04/26/2023 3:52:23 PM Caller stated he felt weak and light headed today when he was outdoors. Caller stated he felt sweaty and hot and needed help getting up. Caller stated he felt hot and like he needed to do a BM and could not. User: [name], RN Date/Time [name] Pratt Time): 04/26/2023 3:57:03 PM Caller states he has Lower back pain that radiates to groan and head surgery in the past. User: [name], RN Date/Time [name] Pratt Time): 04/26/2023 4:06:41 PM Caller states his ears were itchy last night and he put a qtip and his hand jerked and there  was a bit of blood on bottom of ear lobe User: [name], RN Date/Time [name] Pratt Time): 04/26/2023 4:45:10  PM Backline was reached nusre spoke with [employer]. Per Herbert Seta pt should go to the ED now. Herbert Seta stated she will put a not for Melissa - NP to be aware. User: [name], RN Date/Time [name] Pratt Time): 04/26/2023 4:49:19 PM PLEASE NOTE: All timestamps contained within this report are represented as [place] Standard Time. CONFIDENTIALTY NOTICE: This fax transmission is intended only for the addressee. It contains information that is legally privileged, confidential or otherwise protected from use or disclosure. If you are not the intended recipient, you are strictly prohibited from reviewing, disclosing, copying using or disseminating any of this information or taking any action in reliance on or regarding this information. If you have received this fax in error, please notify us immediately by telephone so that we can arrange for its return to [place]. Phone: [phone], Toll-Free: [phone], Fax: [phone] Page: 3 of 3 Call Id: [id number] Comments Nurse advised caller per backline he should go to ED. Caller stated he is with wife and was trying to change seats and fell. Caller stated he is going to get up with his wife's assistance. Nurse advised to call 911 to help him get up. Caller refused stated he can get up on his own

## 2023-05-01 ENCOUNTER — Ambulatory Visit: Payer: Medicare HMO | Admitting: Family

## 2023-05-01 ENCOUNTER — Telehealth: Payer: Self-pay | Admitting: Family

## 2023-05-01 MED ORDER — VALSARTAN 320 MG PO TABS: 320.0000 mg | ORAL_TABLET | Freq: Every day | ORAL | 1 refills | Status: DC

## 2023-05-01 MED ORDER — HYDRALAZINE HCL 50 MG PO TABS: 50.0000 mg | ORAL_TABLET | Freq: Three times a day (TID) | ORAL | 0 refills | Status: DC

## 2023-05-01 NOTE — Telephone Encounter (Signed)
Information given and explained to patient. Advised of medication changes and scheduled to come in next week for follow up.

## 2023-05-01 NOTE — Telephone Encounter (Signed)
Please contact pt to let him know that I see his bp has been high both in the ED and at PT.  I would like to adjust his medication. I would like for hi to d/c losartan and begin valsartan.  Also, please change the hydralazine from 2 x daily to 3x daily. I have sent both rx's to [facility].  Follow up with me in 1 week.   BP Readings from Last 3 Encounters:  04/26/23 (!) 176/73  04/26/23 (!) 192/82  01/22/23 126/61

## 2023-05-02 ENCOUNTER — Ambulatory Visit: Payer: Medicare HMO

## 2023-05-03 DIAGNOSIS — Z6825 Body mass index (BMI) 25.0-25.9, adult: Secondary | ICD-10-CM | POA: Diagnosis not present

## 2023-05-03 DIAGNOSIS — M5416 Radiculopathy, lumbar region: Secondary | ICD-10-CM | POA: Diagnosis not present

## 2023-05-04 ENCOUNTER — Ambulatory Visit: Payer: Medicare HMO | Admitting: Family

## 2023-05-08 ENCOUNTER — Ambulatory Visit: Payer: Medicare HMO | Admitting: Physical Therapy

## 2023-05-09 ENCOUNTER — Ambulatory Visit (INDEPENDENT_AMBULATORY_CARE_PROVIDER_SITE_OTHER): Payer: Medicare HMO | Admitting: Family

## 2023-05-09 VITALS — BP 164/74 | HR 71 | Temp 97.9°F | Resp 16 | Wt 154.0 lb

## 2023-05-09 DIAGNOSIS — S51812D Laceration without foreign body of left forearm, subsequent encounter: Secondary | ICD-10-CM

## 2023-05-09 DIAGNOSIS — I10 Essential (primary) hypertension: Secondary | ICD-10-CM

## 2023-05-09 LAB — BASIC METABOLIC PANEL
BUN: 29 mg/dL — ABNORMAL HIGH (ref 6–23)
CO2: 28 mEq/L (ref 19–32)
Calcium: 9.8 mg/dL (ref 8.4–10.5)
Chloride: 104 mEq/L (ref 96–112)
Creatinine, Ser: 1.21 mg/dL (ref 0.40–1.50)
GFR: 55.48 mL/min — ABNORMAL LOW (ref >60.00)
Glucose, Bld: 104 mg/dL — ABNORMAL HIGH (ref 70–99)
Potassium: 3.9 mEq/L (ref 3.5–5.1)
Sodium: 142 mEq/L (ref 135–145)

## 2023-05-09 MED ORDER — AMLODIPINE BESYLATE 2.5 MG PO TABS
2.5000 mg | ORAL_TABLET | Freq: Every day | ORAL | 1 refills | Status: DC
Start: 2023-05-09 — End: 2023-07-02

## 2023-05-09 MED ORDER — AMLODIPINE BESYLATE 5 MG PO TABS: 5.0000 mg | ORAL_TABLET | Freq: Every day | ORAL | 1 refills | Status: DC

## 2023-05-09 NOTE — Assessment & Plan Note (Signed)
BP Readings from Last 3 Encounters:  05/09/23 (!) 164/74  04/26/23 (!) 176/73  04/26/23 (!) 192/82   Patient's readings have been very high recently in the office but are reportedly much better at home. His home BP meter did read similarly to our meter today. Initially, we discussed adding amlodipine 5 mg, but ultimately decided on 2.5 mg. He has this dose at home.

## 2023-05-09 NOTE — Assessment & Plan Note (Addendum)
  BP Readings from Last 3 Encounters:  05/09/23 (!) 164/74  04/26/23 (!) 176/73  04/26/23 (!) 192/82   Patient's readings have been very high recently in the office but are reportedly much better at home. His home BP meter did read similarly to our meter today. Initially, we discussed adding amlodipine 5 mg, but ultimately decided on 2.5 mg. He has this dose at home.    -Continue home blood pressure monitoring, report concerning readings. -Recheck blood pressure in clinic in two weeks.

## 2023-05-09 NOTE — Assessment & Plan Note (Signed)
-  Continue home blood pressure monitoring, report concerning readings. -Recheck blood pressure in clinic in two weeks.

## 2023-05-09 NOTE — Progress Notes (Signed)
Subjective:     Patient ID: [name] Pratt, male    DOB: 06/13/1941, 82 y.o.   MRN: [id number]  Chief Complaint  Patient presents with   Hypertension    Here for follow up    HPI  Discussed the use of AI scribe software for clinical note transcription with the patient, who gave verbal consent to proceed.  History of Present Illness          Patient presents today for ER follow up.  He had some symptoms  of weakness and dizziness while sitting in a hot garage on 8/15.  He went to the ED.  Earlier that day he had a very high blood pressure at his neurology appointment. He then he went to the ED due to dizziness, diaphoresis. On 8/20 we had changed his losartan to valsartan. ED visit included lab work which was unremarkable and a negative chest x-ray.    The patient reports variable blood pressure readings at home, with some readings as low as 118/65 and others as high as 156/82. He expresses concern about his blood pressure being too low, as he previously experienced weakness and fatigue when his blood pressure was low.  BP Readings from Last 3 Encounters:  05/09/23 (!) 164/74  04/26/23 (!) 176/73  04/26/23 (!) 192/82     Lab Results  Component Value Date   HGBA1C 5.6 01/22/2023   HGBA1C 5.5 03/07/2022   HGBA1C 5.7 12/29/2020   Lab Results  Component Value Date   LDLCALC 55 01/22/2023   CREATININE 1.40 (H) 04/26/2023       Health Maintenance Due  Topic Date Due   FOOT EXAM  09/29/2021   COVID-19 Vaccine (5 - 2023-24 season) 05/12/2022   Medicare Annual Wellness (AWV)  01/20/2023   Diabetic kidney evaluation - Urine ACR  03/16/2023   INFLUENZA VACCINE  04/12/2023    Past Medical History:  Diagnosis Date   Allergic rhinitis 02/02/2016   Anemia 12/17/2009   Formatting of this note might be different from the original. Anemia  10/1 IMO update   Aortic atherosclerosis (HCC) 03/05/2020   Arthritis    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    Atypical chest pain 11/24/2015   Benign essential hypertension 05/13/2009   Qualifier: Diagnosis of  By: Clearence Ped of this note might be different from the original. Hypertension   Benign paroxysmal positional vertigo 09/14/2021   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HCC) 07/22/2021   Body mass index (BMI) 25.0-25.9, adult 11/02/2020   Cancer (HCC)    skin cancer   Cardiac murmur 07/25/2021   Carotid stenosis    a. Carotid U/S 5/13: LICA < 50%, RICA 50-69%;  b.  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   Cystoid macular edema of both eyes 07/28/2020   Degenerative disc disease, lumbar 06/30/2015   Deviated nasal septum 02/18/2015   Diverticulosis 03/03/2020   DJD (degenerative joint disease) of hip 12/24/2012   Dyspnea 06/12/2011   Emphysema, unspecified (HCC) 03/05/2020   Erectile dysfunction 06/20/2017   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  Gait disorder 03/02/2021   GERD (gastroesophageal reflux disease)    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   Qualifier: Diagnosis of  By: Kem Parkinson     Greater trochanteric pain syndrome 07/22/2021   Hand pain, left 01/16/2022   Hearing loss 02/02/2016   Heme positive stool 02/29/2020   HIATAL HERNIA 05/13/2009   Qualifier: Diagnosis of  By: Kem Parkinson     High risk medication use 01/18/2012   Hip pain 03/02/2021   History of chicken pox    History of COVID-19 12/05/2021   History of hemorrhoids    History of kidney stones    History of skin cancer 07/22/2021   skin cancer   History of total bilateral knee  replacement 07/08/2020   Hyperglycemia 03/03/2014   Hyperlipidemia with target LDL less than 130 12/27/2010   Formatting of this note might be different from the original. Cardiology - Dr Estrella Myrtle at [facility] cardiology  ICD-10 cut over   Hypertension    under control   Hypokalemia 07/08/2021   Hypothyroidism    Internal hemorrhoids 03/03/2020   Leg cramps 11/13/2019   Lower GI bleed 12/12/2012   Lumbago of lumbar region with sciatica 10/21/2020   Lumbar radiculopathy 03/02/2021   Lumbar spondylosis 11/02/2020   NEPHROLITHIASIS 05/13/2009   Qualifier: Diagnosis of  By: Kem Parkinson     Nonspecific abnormal electrocardiogram (ECG) (EKG) 01/06/2013   Numbness of hand 01/24/2022   Onychomycosis 06/30/2015   Osteoarthrosis, 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 Osteoarthritis Of The Hand  10/1 IMO update   Osteoarthrosis, unspecified whether generalized or localized, pelvic region and thigh 07/25/2010   Formatting of this note might be different from the original. Osteoarthritis Of The Hip   Other abnormal glucose 07/22/2021   Other allergic rhinitis 02/18/2015   Palpitations 07/25/2021   Peripheral neuropathy 06/19/2016   Preventative health care 11/24/2015   Right groin pain 11/29/2012   Right inguinal hernia 05/13/2009   Qualifier: Diagnosis of  By: Kem Parkinson     RLS (restless legs syndrome) 02/18/2015   Rosacea    Rotator cuff tear 07/27/2013   Situational depression 03/02/2021   Skin lesion 01/16/2022   Sleep apnea with use of continuous positive airway pressure (CPAP) 07/15/2013   CPAP set to  7 cm water,  Residual AHi was 7.8  With no leak.  User time 6 hours.  479 days , not used only 12 days - highly compliant 06-23-13  .    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 Fawn Kirk   Ulnar neuropathy 01/24/2022   Urinary retention 11/26/2020   Ventral hernia 07/08/2012   Vertigo 07/08/2021   Weight loss 03/03/2020    Past Surgical History:  Procedure Laterality Date   ABDOMINAL HERNIA REPAIR  07/15/12   Dr Ashley Jacobs   ANTERIOR CERVICAL DECOMP/DISCECTOMY FUSION N/A 07/09/2020   Procedure: ANTERIOR CERVICAL DECOMPRESSION AND FUSION CERVICAL THREE-FOUR.;  Surgeon: Donalee Citrin, MD;  Location: [facility] OR;  Service: Neurosurgery;  Laterality: N/A;  anterior   BROW LIFT  05/07/01   COLONOSCOPY WITH PROPOFOL N/A 04/24/2022   Procedure: COLONOSCOPY WITH PROPOFOL;  Surgeon: Tressia Danas, MD;  Location: [facility] ENDOSCOPY;  Service: Gastroenterology;  Laterality: N/A;  COLONOSCOPY WITH PROPOFOL N/A 04/26/2022   Procedure: COLONOSCOPY WITH PROPOFOL;  Surgeon: Tressia Danas, MD;  Location: [facility] ENDOSCOPY;  Service: Gastroenterology;  Laterality: N/A;   CYSTOSCOPY WITH URETEROSCOPY AND STENT PLACEMENT Right 01/28/2014   Procedure: CYSTOSCOPY WITH URETEROSCOPY, BASKET RETRIVAL AND  STENT PLACEMENT;  Surgeon: Valetta Fuller, MD;  Location: [facility] ORS;  Service: Urology;  Laterality: Right;   epidural injections     multiple procedures   ESOPHAGEAL DILATION  1993 and 1994   multiple times   EYE SURGERY Bilateral 03/25/10, 2012   cataract removal   HEMORRHOID SURGERY  2014   HIATAL HERNIA REPAIR  12/21/93   HOLMIUM LASER APPLICATION Right 01/28/2014   Procedure: HOLMIUM LASER APPLICATION;  Surgeon: Valetta Fuller, MD;  Location: [facility] ORS;  Service: Urology;  Laterality: Right;   INGUINAL HERNIA REPAIR Right 07/15/12   Dr Ashley Jacobs, x2   JOINT REPLACEMENT  07/25/04   right knee   JOINT REPLACEMENT  07/13/10   left knee   KNEE SURGERY  1995   LAPAROSCOPIC CHOLECYSTECTOMY  09/20/06   with intraoperative cholangiogram and right inguinal herniorrhaphy with mesh     LIGAMENT REPAIR Left 07/2013   shoulder   LITHOTRIPSY     x2   LUMBAR LAMINECTOMY/DECOMPRESSION MICRODISCECTOMY Bilateral 12/27/2022   Procedure: Lumbar Two-Three, Lumbar Three-Four Sublaminar Decompression;  Surgeon: Donalee Citrin, MD;  Location: [facility] OR;  Service: Neurosurgery;  Laterality: Bilateral;   NASAL SEPTUM SURGERY  1975   POLYPECTOMY  04/26/2022   Procedure: POLYPECTOMY;  Surgeon: Tressia Danas, MD;  Location: [facility] ENDOSCOPY;  Service: Gastroenterology;;   POSTERIOR CERVICAL FUSION/FORAMINOTOMY N/A 11/24/2020   Procedure: Posterior Cervical Laminectomy Cervical three-four, Cervical four-five with lateral mass fusion;  Surgeon: Donalee Citrin, MD;  Location: [facility] OR;  Service: Neurosurgery;  Laterality: N/A;   REFRACTIVE SURGERY Bilateral    Right total hip replacement  4/14   SKIN CANCER DESTRUCTION     nose, ear   TONSILLECTOMY  as child   TOTAL HIP ARTHROPLASTY Left    URETHRAL DILATION  1991   Dr. 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   2 grandchildren   Has worked for [employer]/r[employer]   Enjoys wood  working   Completed HS.  [employer]   Social Determinants of Health   Financial Resource Strain: Low Risk  (10/28/2021)   Overall Financial Resource Strain (CARDIA)    Difficulty of Paying Living Expenses: Not very hard  Food Insecurity: Unknown (06/05/2022)   Hunger Vital Sign    Worried About Running Out of Food in the Last Year: Never true    Ran Out of Food in the Last Year: Not on file  Transportation Needs: Unknown (06/05/2022)   PRAPARE - Administrator, Civil Service (Medical): No    Lack of Transportation (Non-Medical): Not on file  Physical Activity: Insufficiently Active (04/09/2023)   Exercise Vital Sign    Days of Exercise per Week: 2 days    Minutes of Exercise per Session: 10 min  Stress: Stress Concern Present (04/09/2023)   [employer] of Occupational Health - Occupational Stress Questionnaire    Feeling of Stress : To some extent  Social Connections: Moderately Isolated (01/09/2023)   Social Connection and Isolation Panel [NHANES]    Frequency of Communication with Friends and Family: Three times a week    Frequency of Social Gatherings with Friends and Family: Three times a week    Attends Religious Services: Never    Active Member of Clubs or Organizations: No    Attends Banker Meetings: Never    Marital Status: Married  Catering manager Violence: Not At Risk (01/19/2022)   Humiliation, Afraid, Rape, and Kick questionnaire    Fear of Current or Ex-Partner: No    Emotionally Abused: No    Physically Abused: No    Sexually Abused: No    Outpatient Medications Prior to Visit  Medication Sig Dispense Refill   acetaminophen (TYLENOL) 500 MG tablet Take 500 mg by mouth 2 (two) times daily.     albuterol (VENTOLIN HFA) 108 (90 Base) MCG/ACT inhaler INHALE 2 PUFFS INTO THE LUNGS EVERY 6 HOURS AS NEEDED FOR SHORTNESS  OF BREATH. (Patient taking differently: Inhale 2 puffs into the lungs every 6 (six) hours as needed for shortness of breath.) 1 each 3   allopurinol (ZYLOPRIM) 100 MG tablet TAKE 1 TABLET (100 MG TOTAL) BY MOUTH 2 (TWO) TIMES DAILY. 180 tablet 1   antiseptic oral rinse (BIOTENE) LIQD 15 mLs by Mouth Rinse route 2 (two) times daily.     aspirin EC 81 MG tablet Take 81 mg by mouth at bedtime.     bacitracin ointment Apply 1 Application topically 2 (two) times daily. (Patient taking differently: Apply 1 Application topically daily as needed for wound care.) 120 g 0   cholestyramine (QUESTRAN) 4 GM/DOSE powder Take by mouth daily. 30 ml     cycloSPORINE (RESTASIS) 0.05 % ophthalmic emulsion Place 1 drop into both eyes 2 (two) times daily.     docusate sodium (COLACE) 100 MG capsule Take 1 capsule (100 mg total) by mouth every 12 (twelve) hours. 60 capsule 0   fluticasone (FLONASE) 50 MCG/ACT nasal spray Place 1 spray into both nostrils daily as needed for allergies.     gabapentin (NEURONTIN) 300 MG capsule Take 300 mg by mouth at bedtime.     GEMTESA 75 MG TABS Take 75 mg by mouth daily.     glucose blood test strip TRUE Metrix Blood Glucose Test Strip, use as instructed 100 each 12   hydrALAZINE (APRESOLINE) 50 MG tablet Take 1 tablet (50 mg total) by mouth 3 (three) times daily. 270 tablet 0   Iron,  Ferrous Sulfate, 325 (65 Fe) MG TABS Take 325 mg by mouth every other day. (Patient taking differently: Take 325 mg by mouth daily.) 30 tablet 0   Lancets MISC 1 each by Does not apply route 2 (two) times daily. TRUE matrix lancets 100 each 3   levothyroxine (SYNTHROID) 150 MCG tablet TAKE 1 TABLET ON 6 DAYS PER WEEK (TAKE 75 MCG ONE TIME PER WEEK) 78 tablet 1   levothyroxine (SYNTHROID) 75 MCG tablet Take 75 mcg once a week (patient on 150 mcg 6 days a week) Dispense #24 for 90 day supply. (Patient taking differently: Take 75 mcg once a week (patient on 150 mcg 6 days a week) Dispense #24 for 90 day  supply. Sunday) 12 tablet 1   loratadine (CLARITIN) 10 MG tablet Take 10 mg by mouth daily as needed for allergies or rhinitis.     Magnesium 500 MG TABS Take 500 mg by mouth daily.     Menthol, Topical Analgesic, (ICY HOT EX) Apply 1 Application topically at bedtime. Lidocaine / on back     metroNIDAZOLE (METROCREAM) 0.75 % cream Apply 1 Application topically daily as needed (head).     Multiple Vitamins-Minerals (MULTIVITAMIN WITH MINERALS) tablet Take 1 tablet by mouth daily.     multivitamin-lutein (OCUVITE-LUTEIN) CAPS capsule Take 1 capsule by mouth daily.     nitroGLYCERIN (NITROSTAT) 0.4 MG SL tablet Place 1 tablet (0.4 mg total) under the tongue every 5 (five) minutes as needed for chest pain. 25 tablet 6   OVER THE COUNTER MEDICATION Place 1 spray into both nostrils 2 (two) times daily. Sinus saline     OVER THE COUNTER MEDICATION Take 15 mLs by mouth at bedtime. Yellow Mustard     polyethylene glycol powder (GLYCOLAX/MIRALAX) 17 GM/SCOOP powder Take 17 g by mouth 2 (two) times daily as needed for mild constipation, moderate constipation or severe constipation. 255 g 0   polyvinyl alcohol (ARTIFICIAL TEARS) 1.4 % ophthalmic solution Place 1 drop into both eyes daily as needed for dry eyes.     PREBIOTIC PRODUCT PO Take 1 tablet by mouth daily.     PRESCRIPTION MEDICATION CPAP- At bedtime and during any time of rest     Probiotic Product (PROBIOTIC DAILY PO) Take 1 capsule by mouth daily as needed (Constipation).     rosuvastatin (CRESTOR) 5 MG tablet TAKE 1 TABLET EVERY DAY 90 tablet 1   valsartan (DIOVAN) 320 MG tablet Take 1 tablet (320 mg total) by mouth daily. 90 tablet 1   vitamin B-12 (CYANOCOBALAMIN) 500 MCG tablet Take 500 mcg by mouth in the morning.     amLODipine (NORVASC) 10 MG tablet Take by mouth. (Patient not taking: Reported on 05/09/2023)     No facility-administered medications prior to visit.    Allergies  Allergen Reactions   Pravastatin Other (See Comments)     Muscle cramps   Sildenafil Other (See Comments)    Tachycardia     Oxycodone Other (See Comments)    Hallucinations    Atenolol Other (See Comments)    Bradycardia    Metoclopramide Hcl Anxiety    ROS     Objective:    Physical Exam Constitutional:      General: He is not in acute distress.    Appearance: He is well-developed.  HENT:     Head: Normocephalic and atraumatic.  Cardiovascular:     Rate and Rhythm: Normal rate and regular rhythm.     Heart sounds: No murmur heard.  Pulmonary:     Effort: Pulmonary effort is normal. No respiratory distress.     Breath sounds: Normal breath sounds. No wheezing or rales.  Skin:    General: Skin is warm and dry.     Comments: Skin tear left forearm (healing well- dressing changed during visit)  Neurological:     Mental Status: He is alert and oriented to person, place, and time.  Psychiatric:        Behavior: Behavior normal.        Thought Content: Thought content normal.      BP (!) 164/74 (BP Location: Left Arm, Patient Position: Sitting, Cuff Size: Small)   Pulse 71   Temp 97.9 F (36.6 C) (Oral)   Resp 16   Wt 154 lb (69.9 kg)   SpO2 97%   BMI 24.86 kg/m  Wt Readings from Last 3 Encounters:  05/09/23 154 lb (69.9 kg)  04/26/23 153 lb (69.4 kg)  04/26/23 153 lb (69.4 kg)       Assessment & Plan:   Problem List Items Addressed This Visit       Unprioritized   Skin tear of forearm without complication, left, subsequent encounter     -Continue home blood pressure monitoring, report concerning readings. -Recheck blood pressure in clinic in two weeks.      Hypertension - Primary     BP Readings from Last 3 Encounters:  05/09/23 (!) 164/74  04/26/23 (!) 176/73  04/26/23 (!) 192/82   Patient's readings have been very high recently in the office but are reportedly much better at home. His home BP meter did read similarly to our meter today. Initially, we discussed adding amlodipine 5 mg, but ultimately  decided on 2.5 mg. He has this dose at home.    -Continue home blood pressure monitoring, report concerning readings. -Recheck blood pressure in clinic in two weeks.      Relevant Medications   amLODipine (NORVASC) 2.5 MG tablet   Other Relevant Orders   Basic Metabolic Panel (BMET)    I have discontinued Skyy B. Self "[name]"'s amLODipine and amLODipine. I am also having him start on amLODipine. Additionally, I am having him maintain his cycloSPORINE, aspirin EC, glucose blood, Lancets, nitroGLYCERIN, Magnesium, albuterol, Gemtesa, PRESCRIPTION MEDICATION, vitamin B-12, loratadine, antiseptic oral rinse, polyvinyl alcohol, Probiotic Product (PROBIOTIC DAILY PO), multivitamin with minerals, levothyroxine, Iron (Ferrous Sulfate), gabapentin, bacitracin, levothyroxine, PREBIOTIC PRODUCT PO, acetaminophen, fluticasone, (Menthol, Topical Analgesic, (ICY HOT EX)), metroNIDAZOLE, OVER THE COUNTER MEDICATION, cholestyramine, OVER THE COUNTER MEDICATION, multivitamin-lutein, docusate sodium, polyethylene glycol powder, rosuvastatin, allopurinol, valsartan, and hydrALAZINE.  Meds ordered this encounter  Medications   DISCONTD: amLODipine (NORVASC) 5 MG tablet    Sig: Take 1 tablet (5 mg total) by mouth daily.    Dispense:  90 tablet    Refill:  1    Order Specific Question:   Supervising Provider    Answer:   Danise Edge A [[id number]]   amLODipine (NORVASC) 2.5 MG tablet    Sig: Take 1 tablet (2.5 mg total) by mouth daily.    Dispense:  90 tablet    Refill:  1    This dose replaces the 5mg  dose- please cancel 5mg  dose I sent earlier.    Order Specific Question:   Supervising Provider    Answer:   Danise Edge A [[id number]]

## 2023-05-10 ENCOUNTER — Ambulatory Visit: Payer: Medicare HMO | Admitting: Physical Therapy

## 2023-05-10 ENCOUNTER — Encounter: Payer: Self-pay | Admitting: Physical Therapy

## 2023-05-10 DIAGNOSIS — G8929 Other chronic pain: Secondary | ICD-10-CM

## 2023-05-10 DIAGNOSIS — R2689 Other abnormalities of gait and mobility: Secondary | ICD-10-CM | POA: Diagnosis not present

## 2023-05-10 DIAGNOSIS — M25562 Pain in left knee: Secondary | ICD-10-CM | POA: Diagnosis not present

## 2023-05-10 DIAGNOSIS — R2681 Unsteadiness on feet: Secondary | ICD-10-CM | POA: Diagnosis not present

## 2023-05-10 DIAGNOSIS — M5459 Other low back pain: Secondary | ICD-10-CM | POA: Diagnosis not present

## 2023-05-10 DIAGNOSIS — R296 Repeated falls: Secondary | ICD-10-CM | POA: Diagnosis not present

## 2023-05-10 DIAGNOSIS — M25561 Pain in right knee: Secondary | ICD-10-CM | POA: Diagnosis not present

## 2023-05-10 DIAGNOSIS — M6281 Muscle weakness (generalized): Secondary | ICD-10-CM

## 2023-05-10 NOTE — Therapy (Signed)
OUTPATIENT PHYSICAL THERAPY TREATMENT / RECERTIFICATION  Progress Note  Reporting Period 03/13/2023 to 05/10/2023   See note below for Objective Data and Assessment of Progress/Goals.    Patient Name: KHIZAR SMITHER MRN: [id number] DOB:July 21, 1941, 82 y.o., male Today's Date: 05/10/2023   END OF SESSION:  PT End of Session - 05/10/23 1532     Visit Number 10    Date for PT Re-Evaluation 07/05/23    Authorization Type [name]    Authorization Time Period auth ending    Authorization - Visit Number --    Authorization - Number of Visits --    Progress Note Due on Visit 20    PT Start Time 1532    PT Stop Time 1621    PT Time Calculation (min) 49 min    Activity Tolerance Patient tolerated treatment well    Behavior During Therapy [facility] for tasks assessed/performed                   Past Medical History:  Diagnosis Date   Allergic rhinitis 02/02/2016   Anemia 12/17/2009   Formatting of this note might be different from the original. 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 Hypertension   Benign paroxysmal positional vertigo 09/14/2021   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HCC) 07/22/2021   Body mass index (BMI) 25.0-25.9, adult 11/02/2020   Cancer (HCC)    skin cancer   Cardiac murmur 07/25/2021   Carotid stenosis    a. Carotid U/S 5/13: LICA < 50%, RICA 50-69%;  b.  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   Cystoid macular edema of both eyes 07/28/2020   Degenerative disc disease, lumbar 06/30/2015   Deviated nasal septum 02/18/2015   Diverticulosis 03/03/2020   DJD (degenerative joint disease) of hip 12/24/2012   Dyspnea 06/12/2011   Emphysema, unspecified (HCC) 03/05/2020   Erectile dysfunction 06/20/2017   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   Gait disorder 03/02/2021   GERD (gastroesophageal reflux disease)    GOUT 05/13/2009   Qualifier: Diagnosis of  By: Kem Parkinson     Greater trochanteric pain syndrome 07/22/2021   Hand pain, left 01/16/2022   Hearing loss 02/02/2016   Heme positive stool 02/29/2020   HIATAL HERNIA 05/13/2009   Qualifier: Diagnosis of  By: Kem Parkinson     High risk medication use 01/18/2012   Hip pain 03/02/2021   History of chicken pox    History of COVID-19 12/05/2021   History of hemorrhoids    History of kidney stones    History of skin cancer 07/22/2021   skin cancer   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 Cardiology -  Dr Estrella Myrtle at [facility] cardiology  ICD-10 cut over   Hypertension    under control   Hypokalemia 07/08/2021   Hypothyroidism    Internal hemorrhoids 03/03/2020   Leg cramps 11/13/2019   Lower GI bleed 12/12/2012   Lumbago of lumbar region with sciatica 10/21/2020   Lumbar radiculopathy 03/02/2021   Lumbar spondylosis 11/02/2020   NEPHROLITHIASIS 05/13/2009   Qualifier: Diagnosis of  By: Kem Parkinson     Nonspecific abnormal electrocardiogram (ECG) (EKG) 01/06/2013   Numbness of hand 01/24/2022   Onychomycosis 06/30/2015   Osteoarthrosis, hand 06/13/2010   Formatting of this note might be different  from the original. Osteoarthritis Of The Hand  10/1 IMO update   Osteoarthrosis, unspecified whether generalized or localized, pelvic region and thigh 07/25/2010   Formatting of this note might be different from the original. Osteoarthritis Of The Hip   Other abnormal glucose 07/22/2021   Other allergic rhinitis 02/18/2015   Palpitations 07/25/2021   Peripheral neuropathy 06/19/2016   Preventative health care 11/24/2015   Right groin pain 11/29/2012   Right inguinal hernia 05/13/2009   Qualifier: Diagnosis of  By: Kem Parkinson     RLS (restless legs syndrome) 02/18/2015   Rosacea    Rotator cuff tear 07/27/2013   Situational depression 03/02/2021   Skin lesion 01/16/2022   Sleep apnea with use of continuous positive airway pressure (CPAP) 07/15/2013   CPAP set to  7 cm water,  Residual AHi was 7.8  With no leak.  User time 6 hours.  479 days , not used only 12 days - highly compliant 06-23-13  .    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 Fawn Kirk   Ulnar neuropathy 01/24/2022   Urinary retention 11/26/2020   Ventral hernia 07/08/2012   Vertigo 07/08/2021   Weight loss 03/03/2020   Past Surgical History:  Procedure Laterality Date   ABDOMINAL HERNIA REPAIR  07/15/12   Dr Ashley Jacobs   ANTERIOR CERVICAL DECOMP/DISCECTOMY FUSION N/A 07/09/2020   Procedure: ANTERIOR CERVICAL DECOMPRESSION AND FUSION CERVICAL THREE-FOUR.;  Surgeon: Donalee Citrin, MD;  Location: [facility] OR;  Service: Neurosurgery;  Laterality: N/A;  anterior   BROW LIFT  05/07/01   COLONOSCOPY WITH PROPOFOL N/A 04/24/2022   Procedure: COLONOSCOPY WITH PROPOFOL;  Surgeon: Tressia Danas, MD;  Location: [facility] ENDOSCOPY;  Service:  Gastroenterology;  Laterality: N/A;   COLONOSCOPY WITH PROPOFOL N/A 04/26/2022   Procedure: COLONOSCOPY WITH PROPOFOL;  Surgeon: Tressia Danas, MD;  Location: [facility] ENDOSCOPY;  Service: Gastroenterology;  Laterality: N/A;   CYSTOSCOPY WITH URETEROSCOPY AND STENT PLACEMENT Right 01/28/2014   Procedure: CYSTOSCOPY WITH URETEROSCOPY, BASKET RETRIVAL AND  STENT PLACEMENT;  Surgeon: Valetta Fuller, MD;  Location: [facility] ORS;  Service: Urology;  Laterality: Right;   epidural injections     multiple procedures   ESOPHAGEAL DILATION  1993 and 1994   multiple times   EYE SURGERY Bilateral 03/25/10, 2012   cataract removal   HEMORRHOID SURGERY  2014   HIATAL HERNIA REPAIR  12/21/93   HOLMIUM LASER APPLICATION Right 01/28/2014   Procedure: HOLMIUM LASER APPLICATION;  Surgeon: Valetta Fuller, MD;  Location: [facility] ORS;  Service: Urology;  Laterality: Right;   INGUINAL HERNIA REPAIR Right 07/15/12   Dr Ashley Jacobs,  x2   JOINT REPLACEMENT  07/25/04   right knee   JOINT REPLACEMENT  07/13/10   left knee   KNEE SURGERY  1995   LAPAROSCOPIC CHOLECYSTECTOMY  09/20/06   with intraoperative cholangiogram and right inguinal herniorrhaphy with mesh    LIGAMENT REPAIR Left 07/2013   shoulder   LITHOTRIPSY     x2   LUMBAR LAMINECTOMY/DECOMPRESSION MICRODISCECTOMY Bilateral 12/27/2022   Procedure: Lumbar Two-Three, Lumbar Three-Four Sublaminar Decompression;  Surgeon: Donalee Citrin, MD;  Location[facility] OR;  Service: Neurosurgery;  Laterality: Bilateral;   NASAL SEPTUM SURGERY  1975   POLYPECTOMY  04/26/2022   Procedure: POLYPECTOMY;  Surgeon: Tressia Danas, MD;  Location: [facility] ENDOSCOPY;  Service: Gastroenterology;;   POSTERIOR CERVICAL FUSION/FORAMINOTOMY N/A 11/24/2020   Procedure: Posterior Cervical Laminectomy Cervical three-four, Cervical four-five with lateral mass fusion;  Surgeon: Donalee Citrin, MD;  Location: [facility] OR;  Service: Neurosurgery;  Laterality: N/A;   REFRACTIVE SURGERY Bilateral    Right total hip replacement  4/14    SKIN CANCER DESTRUCTION     nose, ear   TONSILLECTOMY  as child   TOTAL HIP ARTHROPLASTY Left    URETHRAL DILATION  1991   Dr. Annabell Howells   Patient Active Problem List   Diagnosis Date Noted   Spinal stenosis of lumbar region 12/27/2022   Drowsiness 12/07/2022   Preop examination 12/06/2022   Weakness of lower extremity 10/10/2022   Skin tear of right elbow without complication 09/18/2022   Skin tear of forearm without complication, left, subsequent encounter 09/18/2022   Fall 09/18/2022   Iron deficiency anemia 09/18/2022   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   Vitamin D deficiency 08/07/2022   Closed fracture of left hip (HCC) 08/07/2022   Hand weakness 05/02/2022   Acute kidney injury (HCC) 05/02/2022   Adenomatous polyp of ascending colon    Adenomatous polyp of colon    Acute metabolic encephalopathy 04/24/2022   GI bleed 04/21/2022   Vasovagal syncope    Hemorrhagic shock (HCC)    Hematochezia    Irregular heart beat 02/03/2022   Constipation 02/03/2022   Carpal tunnel syndrome 01/24/2022   Numbness of hand 01/24/2022   Ulnar neuropathy 01/24/2022   Skin lesion 01/16/2022   History of COVID-19 12/05/2021   Benign paroxysmal positional vertigo 09/14/2021   Palpitations 07/25/2021   Cardiac murmur 07/25/2021   History of chicken pox 07/22/2021   History of hemorrhoids 07/22/2021   COPD (chronic obstructive pulmonary disease) (HCC) 07/22/2021   History of kidney stones 07/22/2021   History of skin cancer 07/22/2021   Complication of anesthesia 07/22/2021   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HCC) 07/22/2021   Other abnormal glucose 07/22/2021   Spondylitis (HCC) 07/22/2021   Greater trochanteric pain syndrome 07/22/2021   Vertigo 07/08/2021   Hypokalemia 07/08/2021   Trochanteric bursitis of left hip 06/30/2021   Cancer (HCC) 04/11/2021   Lumbar radiculopathy 03/02/2021   Gait instability 03/02/2021   Hip pain 03/02/2021   Situational depression 03/02/2021   Exudative  age-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 (HCC) 02/09/2021   Urinary retention 11/26/2020   Cervical myelopathy (HCC) 11/24/2020   Body mass index (BMI) 25.0-25.9, adult 11/02/2020   Lumbar spondylosis 11/02/2020   Lumbago of lumbar region with sciatica 10/21/2020   Cystoid macular edema of both eyes 07/28/2020   Status post cervical spinal fusion 07/09/2020   History of total bilateral knee replacement 07/08/2020   Aortic atherosclerosis (HCC) 03/05/2020   Emphysema, unspecified (HCC) 03/05/2020  Arthritis 03/04/2020   Diverticulosis 03/03/2020   Internal hemorrhoids 03/03/2020   Weight loss 03/03/2020   Heme positive stool 02/29/2020   Leg cramps 11/13/2019   Tibialis anterior tendon tear, nontraumatic 11/13/2019   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 07/29/2018   CPAP (continuous positive airway pressure) dependence 03/25/2018   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   Erectile dysfunction 06/20/2017   Stenosis of right carotid artery without cerebral infarction 03/06/2017   Peripheral neuropathy 06/19/2016   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   Hearing loss 02/02/2016   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   Allergic rhinitis 02/02/2016   Atypical chest pain 11/24/2015   Preventative health care 11/24/2015   Onychomycosis 06/30/2015   Degenerative disc disease, lumbar 06/30/2015   Other allergic rhinitis 02/18/2015   Deviated nasal septum 02/18/2015   RLS (restless legs syndrome) 02/18/2015   GERD (gastroesophageal reflux disease) 09/18/2014   Hyperglycemia 03/03/2014   Rotator cuff tear 07/27/2013   Sleep apnea with use of continuous positive airway pressure (CPAP) 07/15/2013   Carotid stenosis 02/24/2013   Nonspecific abnormal electrocardiogram (ECG) (EKG) 01/06/2013   DJD (degenerative joint disease) of hip 12/24/2012   Lower GI bleed 12/12/2012   Right groin pain 11/29/2012   OSA (obstructive  sleep apnea) 11/25/2012   Ventral hernia 07/08/2012   High risk medication use 01/18/2012   Dyspnea 06/12/2011   Hyperlipidemia 12/27/2010   Osteoarthrosis, unspecified whether generalized or localized, pelvic region and thigh 07/25/2010   Osteoarthrosis, hand 06/13/2010   Anemia 12/17/2009   Hypothyroidism 05/13/2009   GOUT 05/13/2009   Right inguinal hernia 05/13/2009   HIATAL HERNIA 05/13/2009   NEPHROLITHIASIS 05/13/2009   ROSACEA 05/13/2009   Hypertension 05/13/2009    PCP: [name], NP   REFERRING PROVIDER: Susa Raring., MD   REFERRING DIAG:  M25.562, G89.29 (ICD-10-CM) - Chronic pain in left knee  M25.561, G89.29 (ICD-10-CM) - Chronic pain in right knee   THERAPY DIAG:  Chronic pain of both knees  Muscle weakness (generalized)  Unsteadiness on feet  Other abnormalities of gait and mobility  Repeated falls  Other low back pain  RATIONALE FOR EVALUATION AND TREATMENT: Rehabilitation  ONSET DATE: off and on for a couple years  NEXT MD VISIT: none scheduled   SUBJECTIVE:                                                                                                                                                                                                         SUBJECTIVE STATEMENT: Pt reports NP adjusted his meds  and his BP before leaving to come to PT was ~130 SBP. Back pain remains essentially unchanged, but shoulders are bothering from leaning down on the RW. No knee pain but legs feel weak.  PAIN: Are you having pain? No - B knees  Are you having pain? Yes: NPRS scale: "uncertain"/10 Pain location: midline low back extending to sides Pain description: pulling, aching Aggravating factors: walking Relieving factors: rest    PERTINENT HISTORY:  L2-3, L3-4 Sublaminar Decompression on 12/27/22; Revision of femoral component of L THA on 07/02/22 s/p fall with periprosthetic fracture; Aortic atherosclerosis; carotid stenosis; cervical  myelopathy s/p ACDF 06/2020 and posterior cervical fusion 11/2020; peripheral neuropathy; lumbar DDD/spondylosis; lumbago with sciatica/lumbar radiculopathy; OA; B TKA; B THA; L greater trochanteric pain syndrome; L RCR 2014; CTR; hearing loss; HTN; BPPV/vertigo; B sacroiliitis; skin cancer; COPD/emphysema; sleep apnea with CPAP; GERD; gout; HLD; LE cramps; RLS   PRECAUTIONS: Fall  WEIGHT BEARING RESTRICTIONS: No  FALLS:  Has patient fallen in last 6 months? Yes. 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  No fracture or bony lesion or other findings.  Impression: Right knee replacement in good condition.   01/31/23 - L knee x-ray: No fracture or bony lesion or arthritic change or other abnormality.  Left  knee replacement looks well-fixed and in excellent position.  Impression: Knee replacement in good condition.   PATIENT SURVEYS:  LEFS 31 / 80 = 38.8 % 05/10/23: 32 / 80 = 40.0 %  COGNITION: Overall cognitive status: Within functional limits for tasks assessed and History of cognitive impairments - at  baseline    SENSATION: Intermittent peripheral neuropathy in B feet  MUSCLE LENGTH: TBA Hamstrings:  ITB:  Piriformis:  Hip flexors:  Quads:  Heelcord:   POSTURE:  rounded shoulders, forward head, and flexed trunk   LOWER EXTREMITY ROM: Limited B hip and ankle ROM Active ROM Right eval Left eval Right 04/03/23 Left 04/03/23  Knee flexion 104 96 110 108  Knee extension -10 -5 4 2    Passive ROM Right eval Left eval  Knee flexion    Knee extension 0 0  (Blank rows = not tested)  LOWER EXTREMITY MMT:  MMT Right Eval* Left Eval* R * 05/10/23 L * 05/10/23  Hip flexion 3+ 4 3+ 4-  Hip extension 3- 3+ 3+ 3+  Hip abduction 4- 4 4 4   Hip adduction 4 4 4 4   Hip internal rotation 3+ 4- 3+ 4-  Hip external rotation 2+ 3- 2+ 3-  Knee flexion 4 4 4+ 4+  Knee extension 4 4+ 4 4  Ankle dorsiflexion 4+ 4 4+ 4-  Ankle plantarflexion      Ankle inversion      Ankle eversion       (Blank rows = not tested, *tested in sitting)  FUNCTIONAL TESTS: (Remaining tests to be assessed next visit with PT) 5 times sit to stand: 18.94 sec w/o UE assist (uncontrolled descent on most reps) (03/22/23) Timed up and go (TUG): 21.29 sec with RW (03/22/23) 10 meter walk test: 21.13 sec with RW; Gait speed = 1.55 ft/sec  Berg Balance Scale: 35/56; <36 = High risk for falls (close to 100%) (03/22/23) Dynamic Gait Index: 11/24; Scores of 19 or less are predictive of falls in older community living adults (03/22/23)  04/17/23: TUG: 20 sec  5xSTS: 18 sec  05/10/23: 5xSTS: 13.31 sec w/o UE assist TUG: 21.06 with RW, difficulty with turning : 14.25 sec with RW Gait speed: 2.30 ft/sec with RW DGI: 13/24  GAIT: Distance walked: 60 ft Assistive device utilized: Environmental consultant - 2 wheeled Level of assistance: SBA Gait pattern: step through pattern, decreased stride length, decreased hip/knee flexion- Right, decreased hip/knee flexion- Left, decreased ankle dorsiflexion- Right, decreased ankle dorsiflexion- Left,  trunk flexed, poor foot clearance- Right, and poor foot clearance- Left Comments: Cues necessary for improved upright posture and rolling walker proximity   TODAY'S TREATMENT:   05/10/23 BP - 146/74, HR - 63, SpO2 - 96% prior to start of visit  THERAPEUTIC EXERCISE: to improve flexibility, strength and mobility.  Demonstration, verbal and tactile cues throughout for technique. NuStep - L4 x 7 min  THERAPEUTIC ACTIVITIES: 5xSTS: 13.31 sec w/o UE assist - better stability on standing and improved eccentric control as compared to previous testing TUG: 21.06 with RW, difficulty with turning : 14.25 sec with RW Gait speed: 2.30 ft/sec with RW DGI: 13/24 LEFS: 32 / 80 = 40.0 %  GAIT TRAINING: To normalize gait pattern and improve safety with RW . Distance walked: 220 ft Assistive device utilized: Environmental consultant - 2 wheeled Level of assistance: SBA Gait pattern: step through pattern and trunk flexed Comments: Cues for upright posture and improved RW proximity Stairs: Level of Assistance: CGA Stair Negotiation Technique: Step to Pattern on descent as well as ascent w/o HHA, Alternating Pattern on ascent with HHA, Forwards with Single Rail on Left & HHA of PT on R (reports he uses his cane at home) Number of Stairs: 14  Height of Stairs: 7"    04/30/23 BP after sitting in waiting room ~15 minutes upon arrival to PT = 170/80 HR = 67 O2 sats = 96%  BP after walking ~50 ft = 184/80 HR = 67 O2 sats = 97%  PT session discontinued due to elevated BP   04/24/23 THERAPEUTIC EXERCISE: to improve flexibility, strength and mobility.  Demonstration, verbal and tactile cues throughout for technique. NuStep L5 x 8 min Standing R/L glute med 45 kickback x 15 bil; B UE support on counter Standing R/L hip flexion + ER x 15 bil; B UE support on counter  NEUROMUSCULAR RE-EDUCATION: To improve balance, proprioception, coordination, reduce fall risk, and amplitude of movement.  Lateral weight shifts  in wide BOS x 20 bil Weight shifts ant/post in staggered stance x 10 with each foot fwd B side-stepping 3 x 10 ft along counter with light UE support on counter/hovering just above counter and SBA/CGA of PT - increased difficulty advancing R foot to side even with cues to lift leg flexing hip and knee Fwd tandem gait 4 x 10  ft along counter with single UE support on counter and SBA/CGA of PT Retro gait 6 x 10 ft along counter with single UE support on counter and SBA/CGA of PT   PATIENT EDUCATION:  Education details: progress with PT, ongoing PT POC, and gait safety with RW   Person educated: Patient Education method: Explanation, Demonstration, and Verbal cues Education comprehension: verbalized understanding, verbal cues required, and needs further education  HOME EXERCISE PROGRAM: Access Code: [email or web address] URL: [email or web address] Date: 03/29/2023 Prepared by: Glenetta Hew  Exercises - Seated Hamstring Stretch with Strap  - 1 x daily - 7 x weekly - 3 sets - 30 sec hold - Seated Quad Set  - 1 x daily - 7 x weekly - 3 sets - 10 reps - 5 seconds hold - Seated Hip Adduction Squeeze with Ball  - 1 x daily - 7 x weekly - 3 sets - 10 reps - 5 secs hold - Supine Quad Set on Towel Roll  - 1 x daily - 7 x weekly - 2 sets - 10 reps - 3 sec hold - Hooklying Single Leg Bent Knee Fallouts with Resistance  - 1 x daily - 7 x weekly - 2 sets - 10 reps - 3 sec hold - Supine Bridge with Resistance Band  - 1 x daily - 7 x weekly - 2 sets - 10 reps - 5 sec hold   ASSESSMENT:  CLINICAL IMPRESSION: Jevonta Okray" returning for first PT visit in greater than 2 weeks due to having issues managing BP (he presented for PT visit on 8/19 but was unable to be treated due to elevated BP).  He reports he is seeing his PCP who has adjusted his BP meds and currently BP seems to be better controlled.  BP reading upon initiation of PT session today 146/74.  [name] reports his knee pain seems to have  improved with no recent pain however he continues to note ongoing BLE weakness as well as LBP and recent B shoulder pain due to excessive weightbearing on RW while ambulating.  He demonstrates a very forward flexed posture with RW held at arms length resulting in poor RW proximity and increased fall risk despite cues for more upright posture and steps within rear legs of walker, with patient noting fear of bumping into crossbar on walker.  If this continues to be an issue may need to recommend alternative assistive device.  BLE MMT revealing some improvement as well as decline resulting in overall strength essentially unchanged.  Some improvement noted on standardized balance testing with 5xSTS STG now met and patient demonstrating improved stability upon achieving standing and better eccentric control on return to sit.  TUG time essentially unchanged, however DGI improved by 2 points to 13/24.  Unable to assess Sharlene Motts today due to time constraints.  [name] continues to demonstrate poor safety awareness with use of assistive device as well as high fall risk per standardized balance testing and will benefit from continued skilled PT to address ongoing pain, strength and balance deficits for improved mobility with decreased pain interference and decreased risk for recurrent falls.  Will recommend recert for continued skilled PT 2x/wk for up to 6 to 8 weeks as progress demonstrated.  OBJECTIVE IMPAIRMENTS: Abnormal gait, decreased activity tolerance, decreased balance, decreased coordination, decreased endurance, decreased knowledge of condition, decreased knowledge of use of DME, decreased mobility, difficulty walking, decreased ROM, decreased strength, decreased safety awareness, increased fascial restrictions, impaired perceived functional ability, increased muscle spasms, impaired flexibility, impaired sensation,  improper body mechanics, postural dysfunction, and pain.   ACTIVITY LIMITATIONS: carrying, lifting,  bending, sitting, standing, squatting, sleeping, stairs, transfers, bed mobility, bathing, locomotion level, and caring for others  PARTICIPATION LIMITATIONS: meal prep, cleaning, laundry, driving, shopping, and community activity  PERSONAL FACTORS: Age, Behavior pattern, Fitness, Past/current experiences, Time since onset of injury/illness/exacerbation, and 3+ comorbidities: L2-3, L3-4 Sublaminar Decompression on 12/27/22; Revision of femoral component of L THA on 07/02/22 s/p fall with periprosthetic fracture; Aortic atherosclerosis; carotid stenosis; 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   REHAB POTENTIAL: Good  CLINICAL DECISION MAKING: Evolving/moderate complexity  EVALUATION COMPLEXITY: Moderate   GOALS: Goals reviewed with patient? Yes  SHORT TERM GOALS: Target date: 04/10/2023, extended to 06/07/2023  Patient will be independent with initial HEP. Baseline:  Goal status: IN PROGRESS  05/10/23 - pt reports difficulty using TB due to inability to get the band over his legs and has been using his hands for manual resistance  2.  Complete balance testing and establish additional STG's/LTG's as appropriate. Baseline:  Goal status: MET  03/22/23  3.  Patient will improve 5x STS time to </= 16 seconds with good eccentric control for improved efficiency and safety with transfers Baseline: 18.94 sec w/o UE assist (uncontrolled descent on most reps) Goal status: MET  05/10/23 - 13.31 sec w/o UE assist with good stability upon standing and improved eccentric control  4.  Patient will demonstrate decreased TUG time to  </= 18 sec to decrease risk for falls with transitional mobility Baseline: 21.29 sec with RW Goal status: IN PROGRESS  04/17/23 - 20 sec with RW; 05/10/23 - 21.06 sec with RW  LONG TERM GOALS: Target date: 05/08/2023, extended to 07/05/2023  Patient will be independent with advanced/ongoing HEP to improve outcomes and carryover.  Baseline:  Goal status: IN PROGRESS  05/10/23 - pt reports difficulty using TB due to inability to get the band over his legs and has been using his hands for manual resistance  2.  Patient will report at least 50-75% improvement in B knee pain with no further instances of subluxation to improve QOL. Baseline: 5/10 Goal status: MET  05/10/23 - no recent knee pain reported  3.  Patient will demonstrate improved B knee extension AROM to [facility] to allow for improved stability for normal gait and stair mechanics. Baseline: Refer to above LE ROM table Goal status: IN PROGRESS  04/03/23 - refer to ROM table  4.  Patient will demonstrate improved B LE strength to >/= 4 to 4+/5 for improved stability and ease of mobility. Baseline: Refer to above LE MMT table Goal status: IN PROGRESS  05/10/23 - overall LE strength essentially unchanged  5.  Patient will be able to ambulate 400' with LRAD and normal gait pattern without increased pain to access community.  Baseline:  Goal status: IN PROGRESS  05/10/23 - 200' with RW and frequent cues for upright posture and improved RW proximity  6. Patient will be able to ascend/descend stairs with 1 HR and reciprocal step pattern safely to access home and community.  Baseline:  Goal status: IN PROGRESS  05/10/23 - able to complete reciprocal ascent with left HR and R HHA/CGA of PT (  reports he uses cane for other hand at home), step to pattern on descent with continued need for R HHA/CGA PT  7.  Patient will report >/= 40/80 on LEFS to demonstrate improved functional ability. Baseline: 31 / 80 = 38.8 % Goal status: IN PROGRESS  05/10/23 - 32 / 80  = 40.0 %  8.  Patient will demonstrate at least 19/24 on DGI to decrease risk of falls. Baseline: 11/24 (03/22/23) Goal status: IN PROGRESS  05/10/23 - 13/241  9.  Patient will improve Berg score by at least 8 points to improve safety and stability with ADLs in standing and reduce risk for falls. Baseline: 35/56 (03/22/23) Goal status: IN PROGRESS  05/10/23 - unable to assess due to time constraints   PLAN:  PT FREQUENCY: 2x/week  PT DURATION: 6-8 weeks  PLANNED INTERVENTIONS: Therapeutic exercises, Therapeutic activity, Neuromuscular re-education, Balance training, Gait training, Patient/Family education, Self Care, Joint mobilization, Stair training, DME instructions, Aquatic Therapy, Dry Needling, Electrical stimulation, Spinal mobilization, Cryotherapy, Moist heat, Taping, Ultrasound, and Manual therapy  PLAN FOR NEXT SESSION: Reassess Berg; B core/lumbopelvic/LE flexibility and strengthening; balance training; review and update/progress HEP as indicated - look into alternative options for Thera-Band vs other types of resistance as pt reports difficulty donning band around legs; if forward flexed posture with gait persists, consider transition to standard RW   Marry Guan, PT 05/10/2023, 6:08 PM

## 2023-05-16 ENCOUNTER — Other Ambulatory Visit: Payer: Self-pay | Admitting: Family

## 2023-05-17 ENCOUNTER — Encounter: Payer: Self-pay | Admitting: Physical Therapy

## 2023-05-17 ENCOUNTER — Ambulatory Visit: Payer: Medicare HMO | Attending: Sports Medicine | Admitting: Physical Therapy

## 2023-05-17 DIAGNOSIS — G8929 Other chronic pain: Secondary | ICD-10-CM | POA: Insufficient documentation

## 2023-05-17 DIAGNOSIS — R296 Repeated falls: Secondary | ICD-10-CM | POA: Insufficient documentation

## 2023-05-17 DIAGNOSIS — M6281 Muscle weakness (generalized): Secondary | ICD-10-CM | POA: Diagnosis not present

## 2023-05-17 DIAGNOSIS — M25562 Pain in left knee: Secondary | ICD-10-CM | POA: Insufficient documentation

## 2023-05-17 DIAGNOSIS — M25561 Pain in right knee: Secondary | ICD-10-CM | POA: Diagnosis not present

## 2023-05-17 DIAGNOSIS — R2681 Unsteadiness on feet: Secondary | ICD-10-CM | POA: Insufficient documentation

## 2023-05-17 DIAGNOSIS — M5459 Other low back pain: Secondary | ICD-10-CM | POA: Insufficient documentation

## 2023-05-17 DIAGNOSIS — R2689 Other abnormalities of gait and mobility: Secondary | ICD-10-CM | POA: Insufficient documentation

## 2023-05-17 NOTE — Therapy (Signed)
OUTPATIENT PHYSICAL THERAPY TREATMENT    Patient Name: [name] Pratt MRN: [id number] DOB:[birth year], 82 y.o., male Today's Date: 05/17/2023   END OF SESSION:  PT End of Session - 05/17/23 1315     Visit Number 11    Date for PT Re-Evaluation 07/05/23    Authorization Type [name]    Authorization Time Period 05/10/23 - 07/05/23    Authorization - Visit Number 2    Authorization - Number of Visits 12    Progress Note Due on Visit 20    PT Start Time 1315    PT Stop Time 1405    PT Time Calculation (min) 50 min    Activity Tolerance Patient tolerated treatment well    Behavior During Therapy [facility] for tasks assessed/performed                    Past Medical History:  Diagnosis Date   Allergic rhinitis 02/02/2016   Anemia 12/17/2009   Formatting of this note might be different from the original. 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 Hypertension   Benign paroxysmal positional vertigo 09/14/2021   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HCC) 07/22/2021   Body mass index (BMI) 25.0-25.9, adult 11/02/2020   Cancer (HCC)    skin cancer   Cardiac murmur 07/25/2021   Carotid stenosis    a. Carotid U/S 5/13: LICA < 50%, RICA 50-69%;  b.  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   Cystoid macular edema of both eyes 07/28/2020   Degenerative disc  disease, lumbar 06/30/2015   Deviated nasal septum 02/18/2015   Diverticulosis 03/03/2020   DJD (degenerative joint disease) of hip 12/24/2012   Dyspnea 06/12/2011   Emphysema, unspecified (HCC) 03/05/2020   Erectile dysfunction 06/20/2017   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   Gait disorder 03/02/2021   GERD (gastroesophageal reflux disease)    GOUT 05/13/2009   Qualifier: Diagnosis of  By: Kem Parkinson     Greater trochanteric pain syndrome 07/22/2021   Hand pain, left 01/16/2022   Hearing loss 02/02/2016   Heme positive stool 02/29/2020   HIATAL HERNIA 05/13/2009   Qualifier: Diagnosis of  By: Kem Parkinson     High risk medication use 01/18/2012   Hip pain 03/02/2021   History of chicken pox    History of COVID-19 12/05/2021   History of hemorrhoids    History of kidney stones    History of skin cancer 07/22/2021   skin cancer   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 Cardiology - Dr Estrella Myrtle at [facility] cardiology  ICD-10 cut over   Hypertension    under control   Hypokalemia 07/08/2021  Hypothyroidism    Internal hemorrhoids 03/03/2020   Leg cramps 11/13/2019   Lower GI bleed 12/12/2012   Lumbago of lumbar region with sciatica 10/21/2020   Lumbar radiculopathy 03/02/2021   Lumbar spondylosis 11/02/2020   NEPHROLITHIASIS 05/13/2009   Qualifier: Diagnosis of  By: Kem Parkinson     Nonspecific abnormal electrocardiogram (ECG) (EKG) 01/06/2013   Numbness of hand 01/24/2022   Onychomycosis 06/30/2015   Osteoarthrosis, 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 Osteoarthritis Of The Hand  10/1 IMO update   Osteoarthrosis, unspecified whether generalized or localized, pelvic region and  thigh 07/25/2010   Formatting of this note might be different from the original. Osteoarthritis Of The Hip   Other abnormal glucose 07/22/2021   Other allergic rhinitis 02/18/2015   Palpitations 07/25/2021   Peripheral neuropathy 06/19/2016   Preventative health care 11/24/2015   Right groin pain 11/29/2012   Right inguinal hernia 05/13/2009   Qualifier: Diagnosis of  By: Kem Parkinson     RLS (restless legs syndrome) 02/18/2015   Rosacea    Rotator cuff tear 07/27/2013   Situational depression 03/02/2021   Skin lesion 01/16/2022   Sleep apnea with use of continuous positive airway pressure (CPAP) 07/15/2013   CPAP set to  7 cm water,  Residual AHi was 7.8  With no leak.  User time 6 hours.  479 days , not used only 12 days - highly compliant 06-23-13  .    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 Fawn Kirk   Ulnar neuropathy 01/24/2022   Urinary retention 11/26/2020   Ventral hernia 07/08/2012   Vertigo 07/08/2021   Weight loss 03/03/2020   Past Surgical History:  Procedure Laterality Date   ABDOMINAL HERNIA REPAIR  07/15/12   Dr Ashley Jacobs   ANTERIOR CERVICAL DECOMP/DISCECTOMY FUSION N/A 07/09/2020   Procedure: ANTERIOR CERVICAL DECOMPRESSION AND FUSION CERVICAL THREE-FOUR.;  Surgeon: Donalee Citrin, MD;  Location: [facility] OR;  Service: Neurosurgery;  Laterality: N/A;  anterior   BROW LIFT  05/07/01   COLONOSCOPY WITH PROPOFOL N/A 04/24/2022   Procedure: COLONOSCOPY WITH PROPOFOL;  Surgeon: Tressia Danas, MD;  Location[facility] ENDOSCOPY;  Service: Gastroenterology;  Laterality: N/A;   COLONOSCOPY WITH PROPOFOL N/A 04/26/2022   Procedure: COLONOSCOPY WITH PROPOFOL;  Surgeon: Tressia Danas, MD;   Location: [facility] ENDOSCOPY;  Service: Gastroenterology;  Laterality: N/A;   CYSTOSCOPY WITH URETEROSCOPY AND STENT PLACEMENT Right 01/28/2014   Procedure: CYSTOSCOPY WITH URETEROSCOPY, BASKET RETRIVAL AND  STENT PLACEMENT;  Surgeon: Valetta Fuller, MD;  Location: [facility] ORS;  Service: Urology;  Laterality: Right;   epidural injections     multiple procedures   ESOPHAGEAL DILATION  1993 and 1994   multiple times   EYE SURGERY Bilateral 03/25/10, 2012   cataract removal   HEMORRHOID SURGERY  2014   HIATAL HERNIA REPAIR  12/21/93   HOLMIUM LASER APPLICATION Right 01/28/2014   Procedure: HOLMIUM LASER APPLICATION;  Surgeon: Valetta Fuller, MD;  Location: [facility] ORS;  Service: Urology;  Laterality: Right;   INGUINAL HERNIA REPAIR Right 07/15/12   Dr Ashley Jacobs, x2   JOINT REPLACEMENT  07/25/04   right knee   JOINT REPLACEMENT  07/13/10   left knee  KNEE SURGERY  1995   LAPAROSCOPIC CHOLECYSTECTOMY  09/20/06   with intraoperative cholangiogram and right inguinal herniorrhaphy with mesh    LIGAMENT REPAIR Left 07/2013   shoulder   LITHOTRIPSY     x2   LUMBAR LAMINECTOMY/DECOMPRESSION MICRODISCECTOMY Bilateral 12/27/2022   Procedure: Lumbar Two-Three, Lumbar Three-Four Sublaminar Decompression;  Surgeon: Donalee Citrin, MD;  Location: [facility] OR;  Service: Neurosurgery;  Laterality: Bilateral;   NASAL SEPTUM SURGERY  1975   POLYPECTOMY  04/26/2022   Procedure: POLYPECTOMY;  Surgeon: Tressia Danas, MD;  Location: [facility] ENDOSCOPY;  Service: Gastroenterology;;   POSTERIOR CERVICAL FUSION/FORAMINOTOMY N/A 11/24/2020   Procedure: Posterior Cervical Laminectomy Cervical three-four, Cervical four-five with lateral mass fusion;  Surgeon: Donalee Citrin, MD;  Location: [facility] OR;  Service: Neurosurgery;  Laterality: N/A;   REFRACTIVE SURGERY Bilateral    Right total hip replacement  4/14   SKIN CANCER DESTRUCTION     nose, ear   TONSILLECTOMY  as child   TOTAL HIP ARTHROPLASTY Left    URETHRAL DILATION  1991   Dr. Annabell Howells   Patient  Active Problem List   Diagnosis Date Noted   Spinal stenosis of lumbar region 12/27/2022   Drowsiness 12/07/2022   Preop examination 12/06/2022   Weakness of lower extremity 10/10/2022   Skin tear of right elbow without complication 09/18/2022   Skin tear of forearm without complication, left, subsequent encounter 09/18/2022   Fall 09/18/2022   Iron deficiency anemia 09/18/2022   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   Vitamin D deficiency 08/07/2022   Closed fracture of left hip (HCC) 08/07/2022   Hand weakness 05/02/2022   Acute kidney injury (HCC) 05/02/2022   Adenomatous polyp of ascending colon    Adenomatous polyp of colon    Acute metabolic encephalopathy 04/24/2022   GI bleed 04/21/2022   Vasovagal syncope    Hemorrhagic shock (HCC)    Hematochezia    Irregular heart beat 02/03/2022   Constipation 02/03/2022   Carpal tunnel syndrome 01/24/2022   Numbness of hand 01/24/2022   Ulnar neuropathy 01/24/2022   Skin lesion 01/16/2022   History of COVID-19 12/05/2021   Benign paroxysmal positional vertigo 09/14/2021   Palpitations 07/25/2021   Cardiac murmur 07/25/2021   History of chicken pox 07/22/2021   History of hemorrhoids 07/22/2021   COPD (chronic obstructive pulmonary disease) (HCC) 07/22/2021   History of kidney stones 07/22/2021   History of skin cancer 07/22/2021   Complication of anesthesia 07/22/2021   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HCC) 07/22/2021   Other abnormal glucose 07/22/2021   Spondylitis (HCC) 07/22/2021   Greater trochanteric pain syndrome 07/22/2021   Vertigo 07/08/2021   Hypokalemia 07/08/2021   Trochanteric bursitis of left hip 06/30/2021   Cancer (HCC) 04/11/2021   Lumbar radiculopathy 03/02/2021   Gait instability 03/02/2021   Hip pain 03/02/2021   Situational depression 03/02/2021   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   Urinary retention 11/26/2020   Cervical  myelopathy (HCC) 11/24/2020   Body mass index (BMI) 25.0-25.9, adult 11/02/2020   Lumbar spondylosis 11/02/2020   Lumbago of lumbar region with sciatica 10/21/2020   Cystoid macular edema of both eyes 07/28/2020   Status post cervical spinal fusion 07/09/2020   History of total bilateral knee replacement 07/08/2020   Aortic atherosclerosis (HCC) 03/05/2020   Emphysema, unspecified (HCC) 03/05/2020   Arthritis 03/04/2020   Diverticulosis 03/03/2020   Internal hemorrhoids 03/03/2020   Weight loss 03/03/2020   Heme positive stool 02/29/2020  Leg cramps 11/13/2019   Tibialis anterior tendon tear, nontraumatic 11/13/2019   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 07/29/2018   CPAP (continuous positive airway pressure) dependence 03/25/2018   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   Erectile dysfunction 06/20/2017   Stenosis of right carotid artery without cerebral infarction 03/06/2017   Peripheral neuropathy 06/19/2016   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   Hearing loss 02/02/2016   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   Allergic rhinitis 02/02/2016   Atypical chest pain 11/24/2015   Preventative health care 11/24/2015   Onychomycosis 06/30/2015   Degenerative disc disease, lumbar 06/30/2015   Other allergic rhinitis 02/18/2015   Deviated nasal septum 02/18/2015   RLS (restless legs syndrome) 02/18/2015   GERD (gastroesophageal reflux disease) 09/18/2014   Hyperglycemia 03/03/2014   Rotator cuff tear 07/27/2013   Sleep apnea with use of continuous positive airway pressure (CPAP) 07/15/2013   Carotid stenosis 02/24/2013   Nonspecific abnormal electrocardiogram (ECG) (EKG) 01/06/2013   DJD (degenerative joint disease) of hip 12/24/2012   Lower GI bleed 12/12/2012   Right groin pain 11/29/2012   OSA (obstructive sleep apnea) 11/25/2012   Ventral hernia 07/08/2012   High risk medication use 01/18/2012   Dyspnea 06/12/2011   Hyperlipidemia 12/27/2010    Osteoarthrosis, unspecified whether generalized or localized, pelvic region and thigh 07/25/2010   Osteoarthrosis, hand 06/13/2010   Anemia 12/17/2009   Hypothyroidism 05/13/2009   GOUT 05/13/2009   Right inguinal hernia 05/13/2009   HIATAL HERNIA 05/13/2009   NEPHROLITHIASIS 05/13/2009   ROSACEA 05/13/2009   Hypertension 05/13/2009    PCP: [name], NP   REFERRING PROVIDER: Susa Raring., MD   REFERRING DIAG:  M25.562, G89.29 (ICD-10-CM) - Chronic pain in left knee  M25.561, G89.29 (ICD-10-CM) - Chronic pain in right knee   THERAPY DIAG:  Chronic pain of both knees  Muscle weakness (generalized)  Unsteadiness on feet  Other abnormalities of gait and mobility  Repeated falls  Other low back pain  RATIONALE FOR EVALUATION AND TREATMENT: Rehabilitation  ONSET DATE: off and on for a couple years  NEXT MD VISIT: none scheduled   SUBJECTIVE:                                                                                                                                                                                                         SUBJECTIVE STATEMENT: Pt reports his BP seems to be better controlled with recent changes. Pt reports a fall in the parking lot at [facility] on Monday when he reached for the door  of his car and slid down to the asphalt.   PAIN: Are you having pain? No - B knees  Are you having pain? Yes: NPRS scale: "uncertain"/10 Pain location: midline low back extending to sides Pain description: pulling, aching Aggravating factors: walking Relieving factors: rest    PERTINENT HISTORY:  L2-3, L3-4 Sublaminar Decompression on 12/27/22; Revision of femoral component of L THA on 07/02/22 s/p fall with periprosthetic fracture; Aortic atherosclerosis; carotid stenosis; cervical myelopathy s/p ACDF 06/2020 and posterior cervical fusion 11/2020; peripheral neuropathy; lumbar DDD/spondylosis; lumbago with sciatica/lumbar radiculopathy; OA; B TKA;  B THA; L greater trochanteric pain syndrome; L RCR 2014; CTR; hearing loss; HTN; BPPV/vertigo; B sacroiliitis; skin cancer; COPD/emphysema; sleep apnea with CPAP; GERD; gout; HLD; LE cramps; RLS   PRECAUTIONS: Fall  WEIGHT BEARING RESTRICTIONS: No  FALLS:  Has patient fallen in last 6 months? Yes. 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  No fracture or bony lesion or other findings.  Impression: Right knee replacement in good condition.   01/31/23 - L knee x-ray: No fracture or bony lesion or arthritic change or other abnormality.  Left  knee replacement looks well-fixed and in excellent position.  Impression: Knee replacement in good condition.   PATIENT SURVEYS:  LEFS 31 / 80 = 38.8 % 05/10/23: 32 / 80 = 40.0 %  COGNITION: Overall cognitive status: Within functional limits for tasks assessed and History of cognitive impairments - at baseline    SENSATION: Intermittent peripheral neuropathy in B feet  MUSCLE LENGTH: TBA Hamstrings:  ITB:  Piriformis:  Hip flexors:  Quads:  Heelcord:   POSTURE:  rounded  shoulders, forward head, and flexed trunk   LOWER EXTREMITY ROM: Limited B hip and ankle ROM Active ROM Right eval Left eval Right 04/03/23 Left 04/03/23  Knee flexion 104 96 110 108  Knee extension -10 -5 4 2    Passive ROM Right eval Left eval  Knee flexion    Knee extension 0 0  (Blank rows = not tested)  LOWER EXTREMITY MMT:  MMT Right Eval* Left Eval* R * 05/10/23 L * 05/10/23  Hip flexion 3+ 4 3+ 4-  Hip extension 3- 3+ 3+ 3+  Hip abduction 4- 4 4 4   Hip adduction 4 4 4 4   Hip internal rotation 3+ 4- 3+ 4-  Hip external rotation 2+ 3- 2+ 3-  Knee flexion 4 4 4+ 4+  Knee extension 4 4+ 4 4  Ankle dorsiflexion 4+ 4 4+ 4-  Ankle plantarflexion      Ankle inversion      Ankle eversion       (  Blank rows = not tested, *tested in sitting)  FUNCTIONAL TESTS: (Remaining tests to be assessed next visit with PT) 5 times sit to stand: 18.94 sec w/o UE assist (uncontrolled descent on most reps) (03/22/23) Timed up and go (TUG): 21.29 sec with RW (03/22/23) 10 meter walk test: 21.13 sec with RW; Gait speed = 1.55 ft/sec  Berg Balance Scale: 35/56; <36 = High risk for falls (close to 100%) (03/22/23) Dynamic Gait Index: 11/24; Scores of 19 or less are predictive of falls in older community living adults (03/22/23)  04/17/23: TUG: 20 sec  5xSTS: 18 sec  05/10/23: 5xSTS: 13.31 sec w/o UE assist TUG: 21.06 with RW, difficulty with turning : 14.25 sec with RW Gait speed: 2.30 ft/sec with RW DGI: 13/24  05/17/23: Sharlene Motts: 32/56  GAIT: Distance walked: 60 ft Assistive device utilized: Environmental consultant - 2 wheeled Level of assistance: SBA Gait pattern: step through pattern, decreased stride length, decreased hip/knee flexion- Right, decreased hip/knee flexion- Left, decreased ankle dorsiflexion- Right, decreased ankle dorsiflexion- Left, trunk flexed, poor foot clearance- Right, and poor foot clearance- Left Comments: Cues necessary for improved upright posture and rolling walker  proximity   TODAY'S TREATMENT:   05/17/23 THERAPEUTIC EXERCISE: to improve flexibility, strength and mobility.  Demonstration, verbal and tactile cues throughout for technique. NuStep - L5 x 7 min R/L standing runner's gastroc x 30" R/L negative heel gastroc stretch with toes on 2" block/book x 30" Seated B toe raises 2 x 10  THERAPEUTIC ACTIVITIES: Berg: 32/56  GAIT TRAINING: To normalize gait pattern and improve safety with RW .  120' with RW - cues for upright posture and improved RW proximity as well as increased hip and knee flexion with heel strike on weight acceptance for improved foot clearance   05/10/23 BP - 146/74, HR - 63, SpO2 - 96% prior to start of visit  THERAPEUTIC EXERCISE: to improve flexibility, strength and mobility.  Demonstration, verbal and tactile cues throughout for technique. NuStep - L4 x 7 min  THERAPEUTIC ACTIVITIES: 5xSTS: 13.31 sec w/o UE assist - better stability on standing and improved eccentric control as compared to previous testing TUG: 21.06 with RW, difficulty with turning : 14.25 sec with RW Gait speed: 2.30 ft/sec with RW DGI: 13/24 LEFS: 32 / 80 = 40.0 %  GAIT TRAINING: To normalize gait pattern and improve safety with RW . Distance walked: 220 ft Assistive device utilized: Environmental consultant - 2 wheeled Level of assistance: SBA Gait pattern: step through pattern and trunk flexed Comments: Cues for upright posture and improved RW proximity Stairs: Level of Assistance: CGA Stair Negotiation Technique: Step to Pattern on descent as well as ascent w/o HHA, Alternating Pattern on ascent with HHA, Forwards with Single Rail on Left & HHA of PT on R (reports he uses his cane at home) Number of Stairs: 14  Height of Stairs: 7"    04/30/23 BP after sitting in waiting room ~15 minutes upon arrival to PT = 170/80 HR = 67 O2 sats = 96%  BP after walking ~50 ft = 184/80 HR = 67 O2 sats = 97%  PT session discontinued due to elevated  BP   PATIENT EDUCATION:  Education details: progress with PT, ongoing PT POC, and gait safety with RW   Person educated: Patient Education method: Explanation, Demonstration, and Verbal cues Education comprehension: verbalized understanding, verbal cues required, and needs further education  HOME EXERCISE PROGRAM: Access Code: [email or web address] URL: [email or web address] Date: 03/29/2023 Prepared by: Glenetta Hew  Exercises - Seated  Hamstring Stretch with Strap  - 1 x daily - 7 x weekly - 3 sets - 30 sec hold - Seated Quad Set  - 1 x daily - 7 x weekly - 3 sets - 10 reps - 5 seconds hold - Seated Hip Adduction Squeeze with Ball  - 1 x daily - 7 x weekly - 3 sets - 10 reps - 5 secs hold - Supine Quad Set on Towel Roll  - 1 x daily - 7 x weekly - 2 sets - 10 reps - 3 sec hold - Hooklying Single Leg Bent Knee Fallouts with Resistance  - 1 x daily - 7 x weekly - 2 sets - 10 reps - 3 sec hold - Supine Bridge with Resistance Band  - 1 x daily - 7 x weekly - 2 sets - 10 reps - 5 sec hold   ASSESSMENT:  CLINICAL IMPRESSION: Alen Suite" reports another recent fall where he notes he turned his ankle but denies pain currently, although he notes more nighttime cramping in his calf muscles. He demonstrates continued increased fwd flexed posture with gait today but also having more difficulty clearing his L toes with at least 3 mild stumbles where he caught his toes as he tried to advance his L foot.  Provided instruction in calf/gastroc stretches to help alleviate abnormal muscle tension and reduce cramping as well as promote improved ability to activate L dorsiflexors, with patient able to ambulate with improved L foot clearance following exercises.  Berg reassessed with score decreased to 32/56, with declined potentially partially related to above issues.  [name] will benefit from continued skilled PT to address ongoing pain, strength and balance deficits for improved mobility with decreased  pain interference and decreased risk for recurrent falls.  OBJECTIVE IMPAIRMENTS: Abnormal gait, decreased activity tolerance, decreased balance, decreased coordination, decreased endurance, decreased knowledge of condition, decreased knowledge of use of DME, decreased mobility, difficulty walking, decreased ROM, decreased strength, decreased safety awareness, increased fascial restrictions, impaired perceived functional ability, increased muscle spasms, impaired flexibility, impaired sensation, improper body mechanics, postural dysfunction, and pain.   ACTIVITY LIMITATIONS: carrying, lifting, bending, sitting, standing, squatting, sleeping, stairs, transfers, bed mobility, bathing, locomotion level, and caring for others  PARTICIPATION LIMITATIONS: meal prep, cleaning, laundry, driving, shopping, and community activity  PERSONAL FACTORS: Age, Behavior pattern, Fitness, Past/current experiences, Time since onset of injury/illness/exacerbation, and 3+ comorbidities: L2-3, L3-4 Sublaminar Decompression on 12/27/22; Revision of femoral component of L THA on 07/02/22 s/p fall with periprosthetic fracture; Aortic atherosclerosis; carotid stenosis; 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   REHAB POTENTIAL: Good  CLINICAL DECISION MAKING: Evolving/moderate complexity  EVALUATION COMPLEXITY: Moderate   GOALS: Goals reviewed with patient? Yes  SHORT TERM GOALS: Target date: 04/10/2023, extended to 06/07/2023  Patient will be independent with initial HEP. Baseline:  Goal status: IN PROGRESS  05/10/23 - pt reports difficulty using  TB due to inability to get the band over his legs and has been using his hands for manual resistance  2.  Complete balance testing and establish additional STG's/LTG's as appropriate. Baseline:  Goal status: MET  03/22/23  3.  Patient will improve 5x STS time to </= 16 seconds with  good eccentric control for improved efficiency and safety with transfers Baseline: 18.94 sec w/o UE assist (uncontrolled descent on most reps) Goal status: MET  05/10/23 - 13.31 sec w/o UE assist with good stability upon standing and improved eccentric control  4.  Patient will demonstrate decreased TUG time to </= 18 sec to decrease risk for falls with transitional mobility Baseline: 21.29 sec with RW Goal status: IN PROGRESS  04/17/23 - 20 sec with RW; 05/10/23 - 21.06 sec with RW  LONG TERM GOALS: Target date: 05/08/2023, extended to 07/05/2023  Patient will be independent with advanced/ongoing HEP to improve outcomes and carryover.  Baseline:  Goal status: IN PROGRESS  05/10/23 - pt reports difficulty using TB due to inability to get the band over his legs and has been using his hands for manual resistance  2.  Patient will report at least 50-75% improvement in B knee pain with no further instances of subluxation to improve QOL. Baseline: 5/10 Goal status: MET  05/10/23 - no recent knee pain reported  3.  Patient will demonstrate improved B knee extension AROM to [facility] to allow for improved stability for normal gait and stair mechanics. Baseline: Refer to above LE ROM table Goal status: IN PROGRESS  04/03/23 - refer to ROM table  4.  Patient will demonstrate improved B LE strength to >/= 4 to 4+/5 for improved stability and ease of mobility. Baseline: Refer to above LE MMT table Goal status: IN PROGRESS  05/10/23 - overall LE strength essentially unchanged  5.  Patient will be able to ambulate 400' with LRAD and normal gait pattern without increased pain to access community.  Baseline:  Goal status: IN PROGRESS   05/10/23 - 200' with RW and frequent cues for upright posture and improved RW proximity  6. Patient will be able to ascend/descend stairs with 1 HR and reciprocal step pattern safely to access home and community.  Baseline:  Goal status: IN PROGRESS  05/10/23 - able to complete reciprocal ascent with left HR and R HHA/CGA of PT (reports he uses cane for other hand at home), step to pattern on descent with continued need for R HHA/CGA PT  7.  Patient will report >/= 40/80 on LEFS to demonstrate improved functional ability. Baseline: 31 / 80 = 38.8 % Goal status: IN PROGRESS  05/10/23 - 32 / 80 = 40.0 %  8.  Patient will demonstrate at least 19/24 on DGI to decrease risk of falls. Baseline: 11/24 (03/22/23) Goal status: IN PROGRESS  05/10/23 - 13/241  9.  Patient will improve Berg score by at least 8 points to improve safety and stability with ADLs in standing and reduce risk for falls. Baseline: 35/56 (03/22/23) Goal status: IN PROGRESS  05/17/23 - 32/56   PLAN:  PT FREQUENCY: 2x/week  PT DURATION: 6-8 weeks  PLANNED INTERVENTIONS: Therapeutic exercises, Therapeutic activity, Neuromuscular re-education, Balance training, Gait training, Patient/Family education, Self Care, Joint mobilization, Stair training, DME instructions, Aquatic Therapy, Dry Needling, Electrical stimulation, Spinal mobilization, Cryotherapy, Moist heat, Taping, Ultrasound, and Manual therapy  PLAN FOR NEXT SESSION: core/lumbopelvic/B LE flexibility and strengthening; balance training; review and update/progress HEP as indicated - look into alternative options for Thera-Band vs other types of resistance as pt reports difficulty donning band around legs; if forward flexed posture with gait persists, consider transition to standard RW   Marry Guan, PT 05/17/2023, 7:05 PM

## 2023-05-21 DIAGNOSIS — H35353 Cystoid macular degeneration, bilateral: Secondary | ICD-10-CM | POA: Diagnosis not present

## 2023-05-21 DIAGNOSIS — H35073 Retinal telangiectasis, bilateral: Secondary | ICD-10-CM | POA: Diagnosis not present

## 2023-05-21 DIAGNOSIS — H353211 Exudative age-related macular degeneration, right eye, with active choroidal neovascularization: Secondary | ICD-10-CM | POA: Diagnosis not present

## 2023-05-23 ENCOUNTER — Ambulatory Visit: Payer: Medicare HMO

## 2023-05-23 DIAGNOSIS — M5459 Other low back pain: Secondary | ICD-10-CM | POA: Diagnosis not present

## 2023-05-23 DIAGNOSIS — M6281 Muscle weakness (generalized): Secondary | ICD-10-CM | POA: Diagnosis not present

## 2023-05-23 DIAGNOSIS — R2689 Other abnormalities of gait and mobility: Secondary | ICD-10-CM | POA: Diagnosis not present

## 2023-05-23 DIAGNOSIS — R2681 Unsteadiness on feet: Secondary | ICD-10-CM | POA: Diagnosis not present

## 2023-05-23 DIAGNOSIS — M25561 Pain in right knee: Secondary | ICD-10-CM | POA: Diagnosis not present

## 2023-05-23 DIAGNOSIS — M25562 Pain in left knee: Secondary | ICD-10-CM | POA: Diagnosis not present

## 2023-05-23 DIAGNOSIS — R296 Repeated falls: Secondary | ICD-10-CM

## 2023-05-23 DIAGNOSIS — G8929 Other chronic pain: Secondary | ICD-10-CM | POA: Diagnosis not present

## 2023-05-23 NOTE — Therapy (Signed)
OUTPATIENT PHYSICAL THERAPY TREATMENT    Patient Name: ISIAIH SCHWEIKART MRN: [id number] DOB:December 31, 1940, 82 y.o., male Today's Date: 05/23/2023   END OF SESSION:  PT End of Session - 05/23/23 1112     Visit Number 12    Date for PT Re-Evaluation 07/05/23    Authorization Type [name]    Authorization Time Period 05/10/23 - 07/05/23    Authorization - Visit Number 3    Authorization - Number of Visits 12    Progress Note Due on Visit 20    PT Start Time 1105    PT Stop Time 1148    PT Time Calculation (min) 43 min    Activity Tolerance Patient tolerated treatment well    Behavior During Therapy [facility] for tasks assessed/performed                     Past Medical History:  Diagnosis Date   Allergic rhinitis 02/02/2016   Anemia 12/17/2009   Formatting of this note might be different from the original. 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 Hypertension   Benign paroxysmal positional vertigo 09/14/2021   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HCC) 07/22/2021   Body mass index (BMI) 25.0-25.9, adult 11/02/2020   Cancer (HCC)    skin cancer   Cardiac murmur 07/25/2021   Carotid stenosis    a. Carotid U/S 5/13: LICA < 50%, RICA 50-69%;  b.  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   Cystoid macular edema of both eyes 07/28/2020   Degenerative  disc disease, lumbar 06/30/2015   Deviated nasal septum 02/18/2015   Diverticulosis 03/03/2020   DJD (degenerative joint disease) of hip 12/24/2012   Dyspnea 06/12/2011   Emphysema, unspecified (HCC) 03/05/2020   Erectile dysfunction 06/20/2017   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   Gait disorder 03/02/2021   GERD (gastroesophageal reflux disease)    GOUT 05/13/2009   Qualifier: Diagnosis of  By: Kem Parkinson     Greater trochanteric pain syndrome 07/22/2021   Hand pain, left 01/16/2022   Hearing loss 02/02/2016   Heme positive stool 02/29/2020   HIATAL HERNIA 05/13/2009   Qualifier: Diagnosis of  By: Kem Parkinson     High risk medication use 01/18/2012   Hip pain 03/02/2021   History of chicken pox    History of COVID-19 12/05/2021   History of hemorrhoids    History of kidney stones    History of skin cancer 07/22/2021   skin cancer   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 Cardiology - Dr Estrella Myrtle at [facility] cardiology  ICD-10 cut over   Hypertension    under control   Hypokalemia  07/08/2021   Hypothyroidism    Internal hemorrhoids 03/03/2020   Leg cramps 11/13/2019   Lower GI bleed 12/12/2012   Lumbago of lumbar region with sciatica 10/21/2020   Lumbar radiculopathy 03/02/2021   Lumbar spondylosis 11/02/2020   NEPHROLITHIASIS 05/13/2009   Qualifier: Diagnosis of  By: Kem Parkinson     Nonspecific abnormal electrocardiogram (ECG) (EKG) 01/06/2013   Numbness of hand 01/24/2022   Onychomycosis 06/30/2015   Osteoarthrosis, 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 Osteoarthritis Of The Hand  10/1 IMO update   Osteoarthrosis, unspecified whether generalized or localized, pelvic  region and thigh 07/25/2010   Formatting of this note might be different from the original. Osteoarthritis Of The Hip   Other abnormal glucose 07/22/2021   Other allergic rhinitis 02/18/2015   Palpitations 07/25/2021   Peripheral neuropathy 06/19/2016   Preventative health care 11/24/2015   Right groin pain 11/29/2012   Right inguinal hernia 05/13/2009   Qualifier: Diagnosis of  By: Kem Parkinson     RLS (restless legs syndrome) 02/18/2015   Rosacea    Rotator cuff tear 07/27/2013   Situational depression 03/02/2021   Skin lesion 01/16/2022   Sleep apnea with use of continuous positive airway pressure (CPAP) 07/15/2013   CPAP set to  7 cm water,  Residual AHi was 7.8  With no leak.  User time 6 hours.  479 days , not used only 12 days - highly compliant 06-23-13  .    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 Fawn Kirk   Ulnar neuropathy 01/24/2022   Urinary retention 11/26/2020   Ventral hernia 07/08/2012   Vertigo 07/08/2021   Weight loss 03/03/2020   Past Surgical History:  Procedure Laterality Date   ABDOMINAL HERNIA REPAIR  07/15/12   Dr Ashley Jacobs   ANTERIOR CERVICAL DECOMP/DISCECTOMY FUSION N/A 07/09/2020   Procedure: ANTERIOR CERVICAL DECOMPRESSION AND FUSION CERVICAL THREE-FOUR.;  Surgeon: Donalee Citrin, MD;  Location: [facility] OR;  Service: Neurosurgery;  Laterality: N/A;  anterior   BROW LIFT  05/07/01   COLONOSCOPY WITH PROPOFOL N/A 04/24/2022   Procedure: COLONOSCOPY WITH PROPOFOL;  Surgeon: Tressia Danas, MD;  Location: [facility] ENDOSCOPY;  Service: Gastroenterology;  Laterality: N/A;   COLONOSCOPY WITH PROPOFOL N/A 04/26/2022   Procedure: COLONOSCOPY WITH PROPOFOL;  Surgeon: Tressia Danas,  MD;  Location: [facility] ENDOSCOPY;  Service: Gastroenterology;  Laterality: N/A;   CYSTOSCOPY WITH URETEROSCOPY AND STENT PLACEMENT Right 01/28/2014   Procedure: CYSTOSCOPY WITH URETEROSCOPY, BASKET RETRIVAL AND  STENT PLACEMENT;  Surgeon: Valetta Fuller, MD;  Location: [facility] ORS;  Service: Urology;  Laterality: Right;   epidural injections     multiple procedures   ESOPHAGEAL DILATION  1993 and 1994   multiple times   EYE SURGERY Bilateral 03/25/10, 2012   cataract removal   HEMORRHOID SURGERY  2014   HIATAL HERNIA REPAIR  12/21/93   HOLMIUM LASER APPLICATION Right 01/28/2014   Procedure: HOLMIUM LASER APPLICATION;  Surgeon: Valetta Fuller, MD;  Location: [facility] ORS;  Service: Urology;  Laterality: Right;   INGUINAL HERNIA REPAIR Right 07/15/12   Dr Ashley Jacobs, x2   JOINT REPLACEMENT  07/25/04   right knee   JOINT REPLACEMENT  07/13/10   left  knee   KNEE SURGERY  1995   LAPAROSCOPIC CHOLECYSTECTOMY  09/20/06   with intraoperative cholangiogram and right inguinal herniorrhaphy with mesh    LIGAMENT REPAIR Left 07/2013   shoulder   LITHOTRIPSY     x2   LUMBAR LAMINECTOMY/DECOMPRESSION MICRODISCECTOMY Bilateral 12/27/2022   Procedure: Lumbar Two-Three, Lumbar Three-Four Sublaminar Decompression;  Surgeon: Donalee Citrin, MD;  Location: [facility] OR;  Service: Neurosurgery;  Laterality: Bilateral;   NASAL SEPTUM SURGERY  1975   POLYPECTOMY  04/26/2022   Procedure: POLYPECTOMY;  Surgeon: Tressia Danas, MD;  [facility] ENDOSCOPY;  Service: Gastroenterology;;   POSTERIOR CERVICAL FUSION/FORAMINOTOMY N/A 11/24/2020   Procedure: Posterior Cervical Laminectomy Cervical three-four, Cervical four-five with lateral mass fusion;  Surgeon: Donalee Citrin, MD;  Location: [facility] OR;  Service: Neurosurgery;  Laterality: N/A;   REFRACTIVE SURGERY Bilateral    Right total hip replacement  4/14   SKIN CANCER DESTRUCTION     nose, ear   TONSILLECTOMY  as child   TOTAL HIP ARTHROPLASTY Left    URETHRAL DILATION  1991   Dr. Annabell Howells    Patient Active Problem List   Diagnosis Date Noted   Spinal stenosis of lumbar region 12/27/2022   Drowsiness 12/07/2022   Preop examination 12/06/2022   Weakness of lower extremity 10/10/2022   Skin tear of right elbow without complication 09/18/2022   Skin tear of forearm without complication, left, subsequent encounter 09/18/2022   Fall 09/18/2022   Iron deficiency anemia 09/18/2022   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   Vitamin D deficiency 08/07/2022   Closed fracture of left hip (HCC) 08/07/2022   Hand weakness 05/02/2022   Acute kidney injury (HCC) 05/02/2022   Adenomatous polyp of ascending colon    Adenomatous polyp of colon    Acute metabolic encephalopathy 04/24/2022   GI bleed 04/21/2022   Vasovagal syncope    Hemorrhagic shock (HCC)    Hematochezia    Irregular heart beat 02/03/2022   Constipation 02/03/2022   Carpal tunnel syndrome 01/24/2022   Numbness of hand 01/24/2022   Ulnar neuropathy 01/24/2022   Skin lesion 01/16/2022   History of COVID-19 12/05/2021   Benign paroxysmal positional vertigo 09/14/2021   Palpitations 07/25/2021   Cardiac murmur 07/25/2021   History of chicken pox 07/22/2021   History of hemorrhoids 07/22/2021   COPD (chronic obstructive pulmonary disease) (HCC) 07/22/2021   History of kidney stones 07/22/2021   History of skin cancer 07/22/2021   Complication of anesthesia 07/22/2021   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HCC) 07/22/2021   Other abnormal glucose 07/22/2021   Spondylitis (HCC) 07/22/2021   Greater trochanteric pain syndrome 07/22/2021   Vertigo 07/08/2021   Hypokalemia 07/08/2021   Trochanteric bursitis of left hip 06/30/2021   Cancer (HCC) 04/11/2021   Lumbar radiculopathy 03/02/2021   Gait instability 03/02/2021   Hip pain 03/02/2021   Situational depression 03/02/2021   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   Urinary retention 11/26/2020    Cervical myelopathy (HCC) 11/24/2020   Body mass index (BMI) 25.0-25.9, adult 11/02/2020   Lumbar spondylosis 11/02/2020   Lumbago of lumbar region with sciatica 10/21/2020   Cystoid macular edema of both eyes 07/28/2020   Status post cervical spinal fusion 07/09/2020   History of total bilateral knee replacement 07/08/2020   Aortic atherosclerosis (HCC) 03/05/2020   Emphysema, unspecified (HCC) 03/05/2020   Arthritis 03/04/2020   Diverticulosis 03/03/2020   Internal hemorrhoids 03/03/2020   Weight loss 03/03/2020   Heme positive  stool 02/29/2020   Leg cramps 11/13/2019   Tibialis anterior tendon tear, nontraumatic 11/13/2019   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 07/29/2018   CPAP (continuous positive airway pressure) dependence 03/25/2018   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   Erectile dysfunction 06/20/2017   Stenosis of right carotid artery without cerebral infarction 03/06/2017   Peripheral neuropathy 06/19/2016   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   Hearing loss 02/02/2016   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   Allergic rhinitis 02/02/2016   Atypical chest pain 11/24/2015   Preventative health care 11/24/2015   Onychomycosis 06/30/2015   Degenerative disc disease, lumbar 06/30/2015   Other allergic rhinitis 02/18/2015   Deviated nasal septum 02/18/2015   RLS (restless legs syndrome) 02/18/2015   GERD (gastroesophageal reflux disease) 09/18/2014   Hyperglycemia 03/03/2014   Rotator cuff tear 07/27/2013   Sleep apnea with use of continuous positive airway pressure (CPAP) 07/15/2013   Carotid stenosis 02/24/2013   Nonspecific abnormal electrocardiogram (ECG) (EKG) 01/06/2013   DJD (degenerative joint disease) of hip 12/24/2012   Lower GI bleed 12/12/2012   Right groin pain 11/29/2012   OSA (obstructive sleep apnea) 11/25/2012   Ventral hernia 07/08/2012   High risk medication use 01/18/2012   Dyspnea 06/12/2011   Hyperlipidemia  12/27/2010   Osteoarthrosis, unspecified whether generalized or localized, pelvic region and thigh 07/25/2010   Osteoarthrosis, hand 06/13/2010   Anemia 12/17/2009   Hypothyroidism 05/13/2009   GOUT 05/13/2009   Right inguinal hernia 05/13/2009   HIATAL HERNIA 05/13/2009   NEPHROLITHIASIS 05/13/2009   ROSACEA 05/13/2009   Hypertension 05/13/2009    PCP: [name], NP   REFERRING PROVIDER: Susa Raring., MD   REFERRING DIAG:  M25.562, G89.29 (ICD-10-CM) - Chronic pain in left knee  M25.561, G89.29 (ICD-10-CM) - Chronic pain in right knee   THERAPY DIAG:  Chronic pain of both knees  Muscle weakness (generalized)  Unsteadiness on feet  Other abnormalities of gait and mobility  Repeated falls  Other low back pain  RATIONALE FOR EVALUATION AND TREATMENT: Rehabilitation  ONSET DATE: off and on for a couple years  NEXT MD VISIT: none scheduled   SUBJECTIVE:                                                                                                                                                                                                         SUBJECTIVE STATEMENT: Pt reports pain when waking up in the morning, none right now. He notes getting cramps in both calves at night.   PAIN: Are you  having pain? No - B knees  Are you having pain? Yes: NPRS scale: "uncertain"/10 Pain location: midline low back extending to sides Pain description: pulling, aching Aggravating factors: walking Relieving factors: rest    PERTINENT HISTORY:  L2-3, L3-4 Sublaminar Decompression on 12/27/22; Revision of femoral component of L THA on 07/02/22 s/p fall with periprosthetic fracture; Aortic atherosclerosis; carotid stenosis; cervical myelopathy s/p ACDF 06/2020 and posterior cervical fusion 11/2020; peripheral neuropathy; lumbar DDD/spondylosis; lumbago with sciatica/lumbar radiculopathy; OA; B TKA; B THA; L greater trochanteric pain syndrome; L RCR 2014; CTR;  hearing loss; HTN; BPPV/vertigo; B sacroiliitis; skin cancer; COPD/emphysema; sleep apnea with CPAP; GERD; gout; HLD; LE cramps; RLS   PRECAUTIONS: Fall  WEIGHT BEARING RESTRICTIONS: No  FALLS:  Has patient fallen in last 6 months? Yes. 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  No fracture or bony lesion or other findings.  Impression: Right knee replacement in good condition.   01/31/23 - L knee x-ray: No fracture or bony lesion or arthritic change or other abnormality.  Left  knee replacement looks well-fixed and in excellent position.  Impression: Knee replacement in good condition.   PATIENT SURVEYS:  LEFS 31 / 80 = 38.8 % 05/10/23: 32 / 80 = 40.0 %  COGNITION: Overall cognitive status: Within functional limits for tasks assessed and History of cognitive impairments - at baseline    SENSATION: Intermittent peripheral neuropathy in B feet  MUSCLE LENGTH: TBA Hamstrings:  ITB:  Piriformis:  Hip flexors:  Quads:  Heelcord:   POSTURE:  rounded shoulders, forward head, and flexed trunk   LOWER EXTREMITY  ROM: Limited B hip and ankle ROM Active ROM Right eval Left eval Right 04/03/23 Left 04/03/23  Knee flexion 104 96 110 108  Knee extension -10 -5 4 2    Passive ROM Right eval Left eval  Knee flexion    Knee extension 0 0  (Blank rows = not tested)  LOWER EXTREMITY MMT:  MMT Right Eval* Left Eval* R * 05/10/23 L * 05/10/23  Hip flexion 3+ 4 3+ 4-  Hip extension 3- 3+ 3+ 3+  Hip abduction 4- 4 4 4   Hip adduction 4 4 4 4   Hip internal rotation 3+ 4- 3+ 4-  Hip external rotation 2+ 3- 2+ 3-  Knee flexion 4 4 4+ 4+  Knee extension 4 4+ 4 4  Ankle dorsiflexion 4+ 4 4+ 4-  Ankle plantarflexion      Ankle inversion      Ankle eversion       (Blank rows = not tested, *tested in sitting)  FUNCTIONAL TESTS: (Remaining tests to be assessed next visit with PT) 5 times sit to stand: 18.94 sec w/o UE assist (uncontrolled descent on most reps) (03/22/23) Timed up and go (TUG): 21.29 sec with RW (03/22/23) 10 meter walk test: 21.13 sec with RW; Gait speed = 1.55 ft/sec  Berg Balance Scale: 35/56; <36 = High risk for falls (close to 100%) (03/22/23) Dynamic Gait Index: 11/24; Scores of 19 or less are predictive of falls in older community living adults (03/22/23)  04/17/23: TUG: 20 sec  5xSTS: 18 sec  05/10/23: 5xSTS: 13.31 sec w/o UE assist TUG: 21.06 with RW, difficulty with turning : 14.25 sec with RW Gait speed: 2.30 ft/sec with RW DGI: 13/24  05/17/23: Sharlene Motts: 32/56  GAIT: Distance walked: 60 ft Assistive device utilized: Environmental consultant - 2 wheeled Level of assistance: SBA Gait pattern: step through pattern, decreased stride length, decreased hip/knee flexion- Right, decreased hip/knee flexion- Left, decreased ankle dorsiflexion- Right, decreased ankle dorsiflexion- Left, trunk flexed, poor foot clearance- Right, and poor foot clearance- Left Comments: Cues necessary for improved upright posture and rolling walker proximity   TODAY'S TREATMENT:  05/23/23 THERAPEUTIC EXERCISE: to improve  flexibility, strength and mobility.  Demonstration, verbal and tactile cues throughout for technique. NuStep - L5 x 6 min Sit to stands x 10  Seated LAQ x 5 BLE Standing hip up and over/sidestep 2# x 10 bil Seated marching 2# x 10 Seated rows 15# 2x10 low grips Seated shoulder flexion AAROM with cane x 10  Seated trunk rotation x 10 each direction   GAIT TRAINING: To normalize gait pattern and improve safety with RW.  180' with RW - cues for upright posture and improved RW proximity   05/17/23 THERAPEUTIC EXERCISE: to improve flexibility, strength and mobility.  Demonstration, verbal and tactile cues throughout for technique. NuStep - L5 x 7 min R/L standing runner's gastroc x 30" R/L negative heel gastroc stretch with toes on 2" block/book x 30" Seated B toe raises 2 x 10  THERAPEUTIC ACTIVITIES: Berg: 32/56  GAIT TRAINING: To normalize gait pattern and improve safety with RW .  120' with RW - cues for upright posture and improved RW proximity as well as increased hip and knee flexion with heel strike on weight acceptance for improved foot clearance   05/10/23 BP - 146/74, HR - 63, SpO2 - 96% prior to start of visit  THERAPEUTIC EXERCISE: to improve flexibility, strength and mobility.  Demonstration, verbal and tactile cues throughout for technique. NuStep - L4 x 7 min  THERAPEUTIC ACTIVITIES: 5xSTS: 13.31 sec w/o UE assist - better stability on standing and improved eccentric control as compared to previous testing TUG: 21.06 with RW, difficulty with turning : 14.25 sec with RW Gait speed: 2.30 ft/sec with RW DGI: 13/24 LEFS: 32 / 80 = 40.0 %  GAIT TRAINING: To normalize gait pattern and improve safety with RW . Distance walked: 220 ft Assistive device utilized: Environmental consultant - 2 wheeled Level of assistance: SBA Gait pattern: step through pattern and trunk flexed Comments: Cues for upright posture and improved RW proximity Stairs: Level of Assistance: CGA Stair  Negotiation Technique: Step to Pattern on descent as well as ascent w/o HHA, Alternating Pattern on ascent with HHA, Forwards with Single Rail on Left & HHA of PT on R (reports he uses his cane at home) Number of Stairs: 14  Height of Stairs: 7"    04/30/23 BP after sitting in waiting room ~15 minutes upon arrival to PT = 170/80 HR =  67 O2 sats = 96%  BP after walking ~50 ft = 184/80 HR = 67 O2 sats = 97%  PT session discontinued due to elevated BP   PATIENT EDUCATION:  Education details: progress with PT, ongoing PT POC, and gait safety with RW   Person educated: Patient Education method: Explanation, Demonstration, and Verbal cues Education comprehension: verbalized understanding, verbal cues required, and needs further education  HOME EXERCISE PROGRAM: Access Code: [email or web address] URL: [email or web address] Date: 03/29/2023 Prepared by: Glenetta Hew  Exercises - Seated Hamstring Stretch with Strap  - 1 x daily - 7 x weekly - 3 sets - 30 sec hold - Seated Quad Set  - 1 x daily - 7 x weekly - 3 sets - 10 reps - 5 seconds hold - Seated Hip Adduction Squeeze with Ball  - 1 x daily - 7 x weekly - 3 sets - 10 reps - 5 secs hold - Supine Quad Set on Towel Roll  - 1 x daily - 7 x weekly - 2 sets - 10 reps - 3 sec hold - Hooklying Single Leg Bent Knee Fallouts with Resistance  - 1 x daily - 7 x weekly - 2 sets - 10 reps - 3 sec hold - Supine Bridge with Resistance Band  - 1 x daily - 7 x weekly - 2 sets - 10 reps - 5 sec hold   ASSESSMENT:  CLINICAL IMPRESSION: [place] [employer]" continues to note cramps in calves at night but improved ability to dorsiflex B ankles with heel strike during gait after new exercises last visit. Exercises today focused on general strengthening to improve activity tolerance and endurance. Needs postural cues during gait and with standing exercise. Exercises tolerance was good today as he was able to complete all interventions. [name] will benefit from  continued skilled PT to address ongoing pain, strength and balance deficits for improved mobility with decreased pain interference and decreased risk for recurrent falls.  OBJECTIVE IMPAIRMENTS: Abnormal gait, decreased activity tolerance, decreased balance, decreased coordination, decreased endurance, decreased knowledge of condition, decreased knowledge of use of DME, decreased mobility, difficulty walking, decreased ROM, decreased strength, decreased safety awareness, increased fascial restrictions, impaired perceived functional ability, increased muscle spasms, impaired flexibility, impaired sensation, improper body mechanics, postural dysfunction, and pain.   ACTIVITY LIMITATIONS: carrying, lifting, bending, sitting, standing, squatting, sleeping, stairs, transfers, bed mobility, bathing, locomotion level, and caring for others  PARTICIPATION LIMITATIONS: meal prep, cleaning, laundry, driving, shopping, and community activity  PERSONAL FACTORS: Age, Behavior pattern, Fitness, Past/current experiences, Time since onset of injury/illness/exacerbation, and 3+ comorbidities: L2-3, L3-4 Sublaminar Decompression on 12/27/22; Revision of femoral component of L THA on 07/02/22 s/p fall with periprosthetic fracture; Aortic atherosclerosis; carotid stenosis; 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   REHAB POTENTIAL: Good  CLINICAL DECISION MAKING: Evolving/moderate complexity  EVALUATION COMPLEXITY: Moderate   GOALS: Goals reviewed with patient? Yes  SHORT TERM GOALS: Target date: 04/10/2023, extended to 06/07/2023  Patient  will be independent with initial HEP. Baseline:  Goal status: IN PROGRESS  05/10/23 - pt reports difficulty using TB due to inability to get the band over his legs and has been using his hands for manual resistance  2.  Complete balance testing and establish additional STG's/LTG's as appropriate. Baseline:  Goal status: MET  03/22/23  3.  Patient will improve 5x STS time to </= 16 seconds with good eccentric control for improved efficiency and safety with transfers Baseline: 18.94 sec w/o UE assist (uncontrolled descent on most reps) Goal status: MET  05/10/23 - 13.31 sec w/o UE assist with good stability upon standing and improved eccentric control  4.  Patient will demonstrate decreased TUG time to </= 18 sec to decrease risk for falls with transitional mobility Baseline: 21.29 sec with RW Goal status: IN PROGRESS  04/17/23 - 20 sec with RW; 05/10/23 - 21.06 sec with RW  LONG TERM GOALS: Target date: 05/08/2023, extended to 07/05/2023  Patient will be independent with advanced/ongoing HEP to improve outcomes and carryover.  Baseline:  Goal status: IN PROGRESS  05/10/23 - pt reports difficulty using TB due to inability to get the band over his legs and has been using his hands for manual resistance  2.  Patient will report at least 50-75% improvement in B knee pain with no further instances of subluxation to improve QOL. Baseline: 5/10 Goal status: MET  05/10/23 - no recent knee pain reported  3.  Patient will demonstrate improved B knee extension AROM to [facility] to allow for improved stability for normal gait and stair mechanics. Baseline: Refer to above LE ROM table Goal status: IN PROGRESS  04/03/23 - refer to ROM table  4.  Patient will demonstrate improved B LE strength to >/= 4 to 4+/5 for improved stability and ease of mobility. Baseline: Refer to above LE MMT table Goal status: IN PROGRESS  05/10/23 - overall LE strength essentially unchanged  5.  Patient will be able to ambulate 400'  with LRAD and normal gait pattern without increased pain to access community.  Baseline:  Goal status: IN PROGRESS  05/10/23 - 200' with RW and frequent cues for upright posture and improved RW proximity  6. Patient will be able to ascend/descend stairs with 1 HR and reciprocal step pattern safely to access home and community.  Baseline:  Goal status: IN PROGRESS  05/10/23 - able to complete reciprocal ascent with left HR and R HHA/CGA of PT (reports he uses cane for other hand at home), step to pattern on descent with continued need for R HHA/CGA PT  7.  Patient will report >/= 40/80 on LEFS to demonstrate improved functional ability. Baseline: 31 / 80 = 38.8 % Goal status: IN PROGRESS  05/10/23 - 32 / 80 = 40.0 %  8.  Patient will demonstrate at least 19/24 on DGI to decrease risk of falls. Baseline: 11/24 (03/22/23) Goal status: IN PROGRESS  05/10/23 - 13/241  9.  Patient will improve Berg score by at least 8 points to improve safety and stability with ADLs in standing and reduce risk for falls. Baseline: 35/56 (03/22/23) Goal status: IN PROGRESS  05/17/23 - 32/56   PLAN:  PT FREQUENCY: 2x/week  PT DURATION: 6-8 weeks  PLANNED INTERVENTIONS: Therapeutic exercises, Therapeutic activity, Neuromuscular re-education, Balance training, Gait training, Patient/Family education, Self Care, Joint mobilization, Stair training, DME instructions, Aquatic Therapy, Dry Needling, Electrical stimulation, Spinal mobilization, Cryotherapy, Moist heat, Taping, Ultrasound, and Manual therapy  PLAN FOR NEXT SESSION: core/lumbopelvic/B LE flexibility and strengthening; balance training; review and update/progress HEP as indicated - look into alternative options for Thera-Band vs other types of resistance as pt reports difficulty donning band around legs; if forward flexed posture with gait persists, consider transition  to standard RW   Darleene Cleaver, PTA 05/23/2023, 12:09 PM

## 2023-05-25 ENCOUNTER — Ambulatory Visit (INDEPENDENT_AMBULATORY_CARE_PROVIDER_SITE_OTHER): Payer: Medicare HMO | Admitting: Family

## 2023-05-25 VITALS — BP 145/62 | HR 70 | Temp 98.0°F | Resp 18 | Ht 66.0 in | Wt 155.0 lb

## 2023-05-25 DIAGNOSIS — I10 Essential (primary) hypertension: Secondary | ICD-10-CM

## 2023-05-25 DIAGNOSIS — R739 Hyperglycemia, unspecified: Secondary | ICD-10-CM

## 2023-05-25 NOTE — Progress Notes (Unsigned)
Subjective:     Patient ID: [name] Pratt, male    DOB: November 12, 1940, 82 y.o.   MRN: [id number]  No chief complaint on file.   HPI  Discussed the use of AI scribe software for clinical note transcription with the patient, who gave verbal consent to proceed.  History of Present Illness   [name] Pratt, a patient with a history of hypertension, presents for a follow-up visit. He reports that his blood pressure readings have been fluctuating, but have stabilized over the last couple of days, ranging from 116 to 141. He has been adhering to his medication regimen, which includes amlodipine 2.5mg  daily. He did not take his amlodipine on the day of the visit to assess its effect on his blood pressure.  In addition to his hypertension, [name] Pratt has been receiving steroid injections for lower back pain. He received two injections about a month ago and plans to receive two more in the near future. He has noticed that his blood sugar levels have been slightly elevated since starting the steroid injections.       Lab Results  Component Value Date   HGBA1C 5.6 01/22/2023      Health Maintenance Due  Topic Date Due   FOOT EXAM  09/29/2021   Medicare Annual Wellness (AWV)  01/20/2023   Diabetic kidney evaluation - Urine ACR  03/16/2023   INFLUENZA VACCINE  04/12/2023   COVID-19 Vaccine (5 - 2023-24 season) 05/13/2023    Past Medical History:  Diagnosis Date   Allergic rhinitis 02/02/2016   Anemia 12/17/2009   Formatting of this note might be different from the original. 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 Hypertension   Benign paroxysmal positional vertigo 09/14/2021   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HCC)  07/22/2021   Body mass index (BMI) 25.0-25.9, adult 11/02/2020   Cancer (HCC)    skin cancer   Cardiac murmur 07/25/2021   Carotid stenosis    a. Carotid U/S 5/13: LICA < 50%, RICA 50-69%;  b.  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   Cystoid macular edema of both eyes 07/28/2020   Degenerative disc disease, lumbar 06/30/2015   Deviated nasal septum 02/18/2015   Diverticulosis 03/03/2020   DJD (degenerative joint disease) of hip 12/24/2012   Dyspnea 06/12/2011   Emphysema, unspecified (HCC) 03/05/2020   Erectile dysfunction 06/20/2017   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   Gait disorder 03/02/2021   GERD (gastroesophageal reflux disease)    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   Qualifier: Diagnosis of  By: Kem Parkinson     Greater trochanteric pain syndrome 07/22/2021   Hand pain, left 01/16/2022   Hearing loss 02/02/2016   Heme positive stool 02/29/2020   HIATAL HERNIA 05/13/2009   Qualifier: Diagnosis of  By: Kem Parkinson     High risk medication use 01/18/2012  Hip pain 03/02/2021   History of chicken pox    History of COVID-19 12/05/2021   History of hemorrhoids    History of kidney stones    History of skin cancer 07/22/2021   skin cancer   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 Osteoarthritis Of The Hand  10/1 IMO update   Osteoarthrosis, unspecified whether generalized or localized, pelvic region and thigh 07/25/2010   Formatting of this note might be different from the original. Osteoarthritis Of The Hip   Other abnormal glucose 07/22/2021   Other allergic rhinitis 02/18/2015   Palpitations 07/25/2021   Peripheral neuropathy 06/19/2016   Preventative health care 11/24/2015   Right groin pain 11/29/2012   Right inguinal hernia 05/13/2009   Qualifier: Diagnosis of  By: Kem Parkinson     RLS (restless legs syndrome) 02/18/2015   Rosacea    Rotator cuff tear 07/27/2013   Situational depression 03/02/2021   Skin lesion 01/16/2022   Sleep apnea with use of continuous positive airway pressure (CPAP) 07/15/2013   CPAP set to  7 cm water,  Residual AHi was 7.8  With no leak.  User time 6 hours.  479 days , not used only 12 days - highly compliant 06-23-13  .    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 Fawn Kirk   Ulnar neuropathy 01/24/2022   Urinary retention 11/26/2020   Ventral hernia 07/08/2012   Vertigo 07/08/2021   Weight loss 03/03/2020     Past Surgical History:  Procedure Laterality Date   ABDOMINAL HERNIA REPAIR  07/15/12   Dr Ashley Jacobs   ANTERIOR CERVICAL DECOMP/DISCECTOMY FUSION N/A 07/09/2020   Procedure: ANTERIOR CERVICAL DECOMPRESSION AND FUSION CERVICAL THREE-FOUR.;  Surgeon: Donalee Citrin, MD;  Location: [facility] OR;  Service: Neurosurgery;  Laterality: N/A;  anterior   BROW LIFT  05/07/01   COLONOSCOPY WITH PROPOFOL N/A 04/24/2022   Procedure: COLONOSCOPY WITH PROPOFOL;  Surgeon: Tressia Danas, MD;  Location: [facility] ENDOSCOPY;  Service: Gastroenterology;  Laterality: N/A;   COLONOSCOPY WITH PROPOFOL N/A 04/26/2022   Procedure: COLONOSCOPY WITH PROPOFOL;  Surgeon: Tressia Danas, MD;  Location[facility] ENDOSCOPY;  Service: Gastroenterology;  Laterality: N/A;   CYSTOSCOPY WITH URETEROSCOPY AND STENT PLACEMENT Right 01/28/2014   Procedure: CYSTOSCOPY WITH URETEROSCOPY, BASKET RETRIVAL AND  STENT PLACEMENT;  Surgeon: Valetta Fuller, MD;  Location: [facility] ORS;  Service: Urology;  Laterality: Right;   epidural injections  multiple procedures   ESOPHAGEAL DILATION  1993 and 1994   multiple times   EYE SURGERY Bilateral 03/25/10, 2012   cataract removal   HEMORRHOID SURGERY  2014   HIATAL HERNIA REPAIR  12/21/93   HOLMIUM LASER APPLICATION Right 01/28/2014   Procedure: HOLMIUM LASER APPLICATION;  Surgeon: Valetta Fuller, MD;  Location: [facility] ORS;  Service: Urology;  Laterality: Right;   INGUINAL HERNIA REPAIR Right 07/15/12   Dr Ashley Jacobs, x2   JOINT REPLACEMENT  07/25/04   right knee   JOINT REPLACEMENT  07/13/10   left knee   KNEE SURGERY  1995   LAPAROSCOPIC CHOLECYSTECTOMY  09/20/06   with intraoperative cholangiogram and right inguinal herniorrhaphy with mesh    LIGAMENT REPAIR Left 07/2013   shoulder   LITHOTRIPSY     x2   LUMBAR LAMINECTOMY/DECOMPRESSION MICRODISCECTOMY Bilateral 12/27/2022   Procedure: Lumbar Two-Three, Lumbar Three-Four Sublaminar Decompression;  Surgeon: Donalee Citrin, MD;  Location: [facility] OR;  Service: Neurosurgery;   Laterality: Bilateral;   NASAL SEPTUM SURGERY  1975   POLYPECTOMY  04/26/2022   Procedure: POLYPECTOMY;  Surgeon: Tressia Danas, MD;  Location: [facility] ENDOSCOPY;  Service: Gastroenterology;;   POSTERIOR CERVICAL FUSION/FORAMINOTOMY N/A 11/24/2020   Procedure: Posterior Cervical Laminectomy Cervical three-four, Cervical four-five with lateral mass fusion;  Surgeon: Donalee Citrin, MD;  Location: [facility] OR;  Service: Neurosurgery;  Laterality: N/A;   REFRACTIVE SURGERY Bilateral    Right total hip replacement  4/14   SKIN CANCER DESTRUCTION     nose, ear   TONSILLECTOMY  as child   TOTAL HIP ARTHROPLASTY Left    URETHRAL DILATION  1991   Dr. 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   2 grandchildren   Has worked for [employer]/r[employer]   Enjoys wood working   Completed HS.  [employer]   Social Determinants of Health   Financial Resource Strain: Low Risk  (10/28/2021)   Overall Financial Resource Strain  (CARDIA)    Difficulty of Paying Living Expenses: Not very hard  Food Insecurity: Unknown (06/05/2022)   Hunger Vital Sign    Worried About Running Out of Food in the Last Year: Never true    Ran Out of  Food in the Last Year: Not on file  Transportation Needs: Unknown (06/05/2022)   PRAPARE - Administrator, Civil Service (Medical): No    Lack of Transportation (Non-Medical): Not on file  Physical Activity: Insufficiently Active (04/09/2023)   Exercise Vital Sign    Days of Exercise per Week: 2 days    Minutes of Exercise per Session: 10 min  Stress: Stress Concern Present (04/09/2023)   [employer] of Occupational Health - Occupational Stress Questionnaire    Feeling of Stress : To some extent  Social Connections: Moderately Isolated (01/09/2023)   Social Connection and Isolation Panel [NHANES]    Frequency of Communication with Friends and Family: Three times a week    Frequency of Social Gatherings with Friends and Family: Three times a week    Attends Religious Services: Never    Active Member of Clubs or Organizations: No    Attends Banker Meetings: Never    Marital Status: Married  Catering manager Violence: Not At Risk (01/19/2022)   Humiliation, Afraid, Rape, and Kick questionnaire    Fear of Current or Ex-Partner: No    Emotionally Abused: No    Physically Abused: No    Sexually Abused: No    Outpatient Medications Prior to Visit  Medication Sig Dispense Refill   acetaminophen (TYLENOL) 500 MG tablet Take 500 mg by mouth 2 (two) times daily.     albuterol (VENTOLIN HFA) 108 (90 Base) MCG/ACT inhaler INHALE 2 PUFFS INTO THE LUNGS EVERY 6 HOURS AS NEEDED FOR SHORTNESS OF BREATH. (Patient taking differently: Inhale 2 puffs into the lungs every 6 (six) hours as needed for shortness of breath.) 1 each 3   allopurinol (ZYLOPRIM) 100 MG tablet TAKE 1 TABLET (100 MG TOTAL) BY MOUTH 2 (TWO) TIMES DAILY. 180 tablet 1   amLODipine (NORVASC) 2.5 MG tablet  Take 1 tablet (2.5 mg total) by mouth daily. 90 tablet 1   antiseptic oral rinse (BIOTENE) LIQD 15 mLs by Mouth Rinse route 2 (two) times daily.     aspirin EC 81 MG tablet Take 81 mg by mouth at bedtime.     bacitracin ointment Apply 1 Application topically 2 (two) times daily. (Patient taking differently: Apply 1 Application topically daily as needed for wound care.) 120 g 0   cholestyramine (QUESTRAN) 4 GM/DOSE powder Take by mouth daily. 30 ml     cycloSPORINE (RESTASIS) 0.05 % ophthalmic emulsion Place 1 drop into both eyes 2 (two) times daily.     docusate sodium (COLACE) 100 MG capsule Take 1 capsule (100 mg total) by mouth every 12 (twelve) hours. 60 capsule 0   fluticasone (FLONASE) 50 MCG/ACT nasal spray Place 1 spray into both nostrils daily as needed for allergies.     gabapentin (NEURONTIN) 300 MG capsule TAKE 1 CAPSULE TWO TO THREE TIMES DAILY AS NEEDED 180 capsule 0   GEMTESA 75 MG TABS Take 75 mg by mouth daily.     glucose blood test strip TRUE Metrix Blood Glucose Test Strip, use as instructed 100 each 12   hydrALAZINE (APRESOLINE) 50 MG tablet Take 1 tablet (50 mg total) by mouth 3 (three) times daily. 270 tablet 0   Iron, Ferrous Sulfate, 325 (65 Fe) MG TABS Take 325 mg by mouth every other day. (Patient taking differently: Take 325 mg by mouth daily.) 30 tablet 0   Lancets MISC 1 each by Does not apply route 2 (two) times daily. TRUE matrix lancets 100 each 3  levothyroxine (SYNTHROID) 150 MCG tablet TAKE 1 TABLET ON 6 DAYS PER WEEK (TAKE 75 MCG ONE TIME PER WEEK) 78 tablet 1   levothyroxine (SYNTHROID) 75 MCG tablet Take 75 mcg once a week (patient on 150 mcg 6 days a week) Dispense #24 for 90 day supply. (Patient taking differently: Take 75 mcg once a week (patient on 150 mcg 6 days a week) Dispense #24 for 90 day supply. Sunday) 12 tablet 1   loratadine (CLARITIN) 10 MG tablet Take 10 mg by mouth daily as needed for allergies or rhinitis.     Magnesium 500 MG TABS Take 500  mg by mouth daily.     Menthol, Topical Analgesic, (ICY HOT EX) Apply 1 Application topically at bedtime. Lidocaine / on back     metroNIDAZOLE (METROCREAM) 0.75 % cream Apply 1 Application topically daily as needed (head).     Multiple Vitamins-Minerals (MULTIVITAMIN WITH MINERALS) tablet Take 1 tablet by mouth daily.     multivitamin-lutein (OCUVITE-LUTEIN) CAPS capsule Take 1 capsule by mouth daily.     nitroGLYCERIN (NITROSTAT) 0.4 MG SL tablet Place 1 tablet (0.4 mg total) under the tongue every 5 (five) minutes as needed for chest pain. 25 tablet 6   OVER THE COUNTER MEDICATION Place 1 spray into both nostrils 2 (two) times daily. Sinus saline     OVER THE COUNTER MEDICATION Take 15 mLs by mouth at bedtime. Yellow Mustard     polyethylene glycol powder (GLYCOLAX/MIRALAX) 17 GM/SCOOP powder Take 17 g by mouth 2 (two) times daily as needed for mild constipation, moderate constipation or severe constipation. 255 g 0   polyvinyl alcohol (ARTIFICIAL TEARS) 1.4 % ophthalmic solution Place 1 drop into both eyes daily as needed for dry eyes.     PREBIOTIC PRODUCT PO Take 1 tablet by mouth daily.     PRESCRIPTION MEDICATION CPAP- At bedtime and during any time of rest     Probiotic Product (PROBIOTIC DAILY PO) Take 1 capsule by mouth daily as needed (Constipation).     rosuvastatin (CRESTOR) 5 MG tablet TAKE 1 TABLET EVERY DAY 90 tablet 1   valsartan (DIOVAN) 320 MG tablet Take 1 tablet (320 mg total) by mouth daily. 90 tablet 1   vitamin B-12 (CYANOCOBALAMIN) 500 MCG tablet Take 500 mcg by mouth in the morning.     No facility-administered medications prior to visit.    Allergies  Allergen Reactions   Pravastatin Other (See Comments)    Muscle cramps   Sildenafil Other (See Comments)    Tachycardia     Oxycodone Other (See Comments)    Hallucinations    Atenolol Other (See Comments)    Bradycardia    Metoclopramide Hcl Anxiety    ROS    See HPI Objective:    Physical  Exam Constitutional:      General: He is not in acute distress.    Appearance: He is well-developed.  HENT:     Head: Normocephalic and atraumatic.  Cardiovascular:     Rate and Rhythm: Normal rate and regular rhythm.     Heart sounds: No murmur heard. Pulmonary:     Effort: Pulmonary effort is normal. No respiratory distress.     Breath sounds: Normal breath sounds. No wheezing or rales.  Skin:    General: Skin is warm and dry.  Neurological:     Mental Status: He is alert and oriented to person, place, and time.  Psychiatric:        Behavior: Behavior normal.  Thought Content: Thought content normal.      BP (!) 145/62   Pulse 70   Temp 98 F (36.7 C)   Resp 18   Ht 5\' 6"  (1.676 m)   Wt 155 lb (70.3 kg)   SpO2 98%   BMI 25.02 kg/m  Wt Readings from Last 3 Encounters:  05/25/23 155 lb (70.3 kg)  05/09/23 154 lb (69.9 kg)  04/26/23 153 lb (69.4 kg)       Assessment & Plan:   Problem List Items Addressed This Visit       Unprioritized   Hypertension - Primary     Blood pressure readings at home have been variable but generally improved with the addition of Amlodipine 2.5mg  daily. Today's office reading was 145/62. -Continue current regimen including Amlodipine 2.5mg  daily. -Monitor blood pressure at home and report any concerns.       Hyperglycemia     Mildly elevated blood glucose at 104, likely secondary to recent steroid injections for back pain. Previous A1c in May was normal. -Continue to monitor blood glucose at home. -Plan to reassess in three months, particularly if additional steroid injections are received.       I am having Chibueze B. Blaker "[name]" maintain his cycloSPORINE, aspirin EC, glucose blood, Lancets, nitroGLYCERIN, Magnesium, albuterol, Gemtesa, PRESCRIPTION MEDICATION, vitamin B-12, loratadine, antiseptic oral rinse, polyvinyl alcohol, Probiotic Product (PROBIOTIC DAILY PO), multivitamin with minerals, levothyroxine, Iron  (Ferrous Sulfate), bacitracin, levothyroxine, PREBIOTIC PRODUCT PO, acetaminophen, fluticasone, (Menthol, Topical Analgesic, (ICY HOT EX)), metroNIDAZOLE, OVER THE COUNTER MEDICATION, cholestyramine, OVER THE COUNTER MEDICATION, multivitamin-lutein, docusate sodium, polyethylene glycol powder, rosuvastatin, allopurinol, valsartan, hydrALAZINE, amLODipine, and gabapentin.  No orders of the defined types were placed in this encounter.

## 2023-05-27 NOTE — Assessment & Plan Note (Signed)
  Blood pressure readings at home have been variable but generally improved with the addition of Amlodipine 2.5mg  daily. Today's office reading was 145/62. -Continue current regimen including Amlodipine 2.5mg  daily. -Monitor blood pressure at home and report any concerns.

## 2023-05-27 NOTE — Assessment & Plan Note (Signed)
  Mildly elevated blood glucose at 104, likely secondary to recent steroid injections for back pain. Previous A1c in May was normal. -Continue to monitor blood glucose at home. -Plan to reassess in three months, particularly if additional steroid injections are received.

## 2023-05-28 ENCOUNTER — Ambulatory Visit: Payer: Medicare HMO

## 2023-05-28 DIAGNOSIS — M5459 Other low back pain: Secondary | ICD-10-CM | POA: Diagnosis not present

## 2023-05-28 DIAGNOSIS — R2681 Unsteadiness on feet: Secondary | ICD-10-CM

## 2023-05-28 DIAGNOSIS — M6281 Muscle weakness (generalized): Secondary | ICD-10-CM | POA: Diagnosis not present

## 2023-05-28 DIAGNOSIS — G8929 Other chronic pain: Secondary | ICD-10-CM

## 2023-05-28 DIAGNOSIS — M25561 Pain in right knee: Secondary | ICD-10-CM | POA: Diagnosis not present

## 2023-05-28 DIAGNOSIS — R296 Repeated falls: Secondary | ICD-10-CM | POA: Diagnosis not present

## 2023-05-28 DIAGNOSIS — R2689 Other abnormalities of gait and mobility: Secondary | ICD-10-CM | POA: Diagnosis not present

## 2023-05-28 DIAGNOSIS — M25562 Pain in left knee: Secondary | ICD-10-CM | POA: Diagnosis not present

## 2023-05-28 NOTE — Therapy (Signed)
OUTPATIENT PHYSICAL THERAPY TREATMENT    Patient Name: [name] Pratt MRN: [id number] DOB:17-Apr-1941, 82 y.o., male Today's Date: 05/28/2023   END OF SESSION:  PT End of Session - 05/28/23 1219     Visit Number 13    Date for PT Re-Evaluation 07/05/23    Authorization Type [name]    Authorization Time Period 05/10/23 - 07/05/23    Authorization - Visit Number 4    Authorization - Number of Visits 12    Progress Note Due on Visit 20    PT Start Time 1146    PT Stop Time 1231    PT Time Calculation (min) 45 min    Activity Tolerance Patient tolerated treatment well    Behavior During Therapy [facility] for tasks assessed/performed                      Past Medical History:  Diagnosis Date   Allergic rhinitis 02/02/2016   Anemia 12/17/2009   Formatting of this note might be different from the original. 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 Hypertension   Benign paroxysmal positional vertigo 09/14/2021   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HCC) 07/22/2021   Body mass index (BMI) 25.0-25.9, adult 11/02/2020   Cancer (HCC)    skin cancer   Cardiac murmur 07/25/2021   Carotid stenosis    a. Carotid U/S 5/13: LICA < 50%, RICA 50-69%;  b.  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   Cystoid macular edema of both eyes 07/28/2020   Degenerative  disc disease, lumbar 06/30/2015   Deviated nasal septum 02/18/2015   Diverticulosis 03/03/2020   DJD (degenerative joint disease) of hip 12/24/2012   Dyspnea 06/12/2011   Emphysema, unspecified (HCC) 03/05/2020   Erectile dysfunction 06/20/2017   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   Gait disorder 03/02/2021   GERD (gastroesophageal reflux disease)    GOUT 05/13/2009   Qualifier: Diagnosis of  By: Kem Parkinson     Greater trochanteric pain syndrome 07/22/2021   Hand pain, left 01/16/2022   Hearing loss 02/02/2016   Heme positive stool 02/29/2020   HIATAL HERNIA 05/13/2009   Qualifier: Diagnosis of  By: Kem Parkinson     High risk medication use 01/18/2012   Hip pain 03/02/2021   History of chicken pox    History of COVID-19 12/05/2021   History of hemorrhoids    History of kidney stones    History of skin cancer 07/22/2021   skin cancer   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 Cardiology - Dr Estrella Myrtle at [facility] cardiology  ICD-10 cut over   Hypertension    under control  Hypokalemia 07/08/2021   Hypothyroidism    Internal hemorrhoids 03/03/2020   Leg cramps 11/13/2019   Lower GI bleed 12/12/2012   Lumbago of lumbar region with sciatica 10/21/2020   Lumbar radiculopathy 03/02/2021   Lumbar spondylosis 11/02/2020   NEPHROLITHIASIS 05/13/2009   Qualifier: Diagnosis of  By: Kem Parkinson     Nonspecific abnormal electrocardiogram (ECG) (EKG) 01/06/2013   Numbness of hand 01/24/2022   Onychomycosis 06/30/2015   Osteoarthrosis, 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 Osteoarthritis Of The Hand  10/1 IMO update   Osteoarthrosis, unspecified whether generalized or localized, pelvic  region and thigh 07/25/2010   Formatting of this note might be different from the original. Osteoarthritis Of The Hip   Other abnormal glucose 07/22/2021   Other allergic rhinitis 02/18/2015   Palpitations 07/25/2021   Peripheral neuropathy 06/19/2016   Preventative health care 11/24/2015   Right groin pain 11/29/2012   Right inguinal hernia 05/13/2009   Qualifier: Diagnosis of  By: Kem Parkinson     RLS (restless legs syndrome) 02/18/2015   Rosacea    Rotator cuff tear 07/27/2013   Situational depression 03/02/2021   Skin lesion 01/16/2022   Sleep apnea with use of continuous positive airway pressure (CPAP) 07/15/2013   CPAP set to  7 cm water,  Residual AHi was 7.8  With no leak.  User time 6 hours.  479 days , not used only 12 days - highly compliant 06-23-13  .    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 Fawn Kirk   Ulnar neuropathy 01/24/2022   Urinary retention 11/26/2020   Ventral hernia 07/08/2012   Vertigo 07/08/2021   Weight loss 03/03/2020   Past Surgical History:  Procedure Laterality Date   ABDOMINAL HERNIA REPAIR  07/15/12   Dr Ashley Jacobs   ANTERIOR CERVICAL DECOMP/DISCECTOMY FUSION N/A 07/09/2020   Procedure: ANTERIOR CERVICAL DECOMPRESSION AND FUSION CERVICAL THREE-FOUR.;  Surgeon: Donalee Citrin, MD;  Location: [facility] OR;  Service: Neurosurgery;  Laterality: N/A;  anterior   BROW LIFT  05/07/01   COLONOSCOPY WITH PROPOFOL N/A 04/24/2022   Procedure: COLONOSCOPY WITH PROPOFOL;  Surgeon: Tressia Danas, MD;  Location[facility] ENDOSCOPY;  Service: Gastroenterology;  Laterality: N/A;   COLONOSCOPY WITH PROPOFOL N/A 04/26/2022   Procedure: COLONOSCOPY WITH PROPOFOL;  Surgeon: Tressia Danas,  MD;  Location: [facility] ENDOSCOPY;  Service: Gastroenterology;  Laterality: N/A;   CYSTOSCOPY WITH URETEROSCOPY AND STENT PLACEMENT Right 01/28/2014   Procedure: CYSTOSCOPY WITH URETEROSCOPY, BASKET RETRIVAL AND  STENT PLACEMENT;  Surgeon: Valetta Fuller, MD;  Location: [facility] ORS;  Service: Urology;  Laterality: Right;   epidural injections     multiple procedures   ESOPHAGEAL DILATION  1993 and 1994   multiple times   EYE SURGERY Bilateral 03/25/10, 2012   cataract removal   HEMORRHOID SURGERY  2014   HIATAL HERNIA REPAIR  12/21/93   HOLMIUM LASER APPLICATION Right 01/28/2014   Procedure: HOLMIUM LASER APPLICATION;  Surgeon: Valetta Fuller, MD;  Location: [facility] ORS;  Service: Urology;  Laterality: Right;   INGUINAL HERNIA REPAIR Right 07/15/12   Dr Ashley Jacobs, x2   JOINT REPLACEMENT  07/25/04   right knee   JOINT REPLACEMENT  07/13/10  left knee   KNEE SURGERY  1995   LAPAROSCOPIC CHOLECYSTECTOMY  09/20/06   with intraoperative cholangiogram and right inguinal herniorrhaphy with mesh    LIGAMENT REPAIR Left 07/2013   shoulder   LITHOTRIPSY     x2   LUMBAR LAMINECTOMY/DECOMPRESSION MICRODISCECTOMY Bilateral 12/27/2022   Procedure: Lumbar Two-Three, Lumbar Three-Four Sublaminar Decompression;  Surgeon: Donalee Citrin, MD;  Location: [facility] OR;  Service: Neurosurgery;  Laterality: Bilateral;   NASAL SEPTUM SURGERY  1975   POLYPECTOMY  04/26/2022   Procedure: POLYPECTOMY;  Surgeon: Tressia Danas, MD;  Location: [facility] ENDOSCOPY;  Service: Gastroenterology;;   POSTERIOR CERVICAL FUSION/FORAMINOTOMY N/A 11/24/2020   Procedure: Posterior Cervical Laminectomy Cervical three-four, Cervical four-five with lateral mass fusion;  Surgeon: Donalee Citrin, MD;  Location: [facility] OR;  Service: Neurosurgery;  Laterality: N/A;   REFRACTIVE SURGERY Bilateral    Right total hip replacement  4/14   SKIN CANCER DESTRUCTION     nose, ear   TONSILLECTOMY  as child   TOTAL HIP ARTHROPLASTY Left    URETHRAL DILATION  1991   Dr. Annabell Howells    Patient Active Problem List   Diagnosis Date Noted   Spinal stenosis of lumbar region 12/27/2022   Drowsiness 12/07/2022   Preop examination 12/06/2022   Weakness of lower extremity 10/10/2022   Skin tear of right elbow without complication 09/18/2022   Skin tear of forearm without complication, left, subsequent encounter 09/18/2022   Fall 09/18/2022   Iron deficiency anemia 09/18/2022   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   Vitamin D deficiency 08/07/2022   Closed fracture of left hip (HCC) 08/07/2022   Hand weakness 05/02/2022   Acute kidney injury (HCC) 05/02/2022   Adenomatous polyp of ascending colon    Adenomatous polyp of colon    Acute metabolic encephalopathy 04/24/2022   GI bleed 04/21/2022   Vasovagal syncope    Hemorrhagic shock (HCC)    Hematochezia    Irregular heart beat 02/03/2022   Constipation 02/03/2022   Carpal tunnel syndrome 01/24/2022   Numbness of hand 01/24/2022   Ulnar neuropathy 01/24/2022   Skin lesion 01/16/2022   History of COVID-19 12/05/2021   Benign paroxysmal positional vertigo 09/14/2021   Palpitations 07/25/2021   Cardiac murmur 07/25/2021   History of chicken pox 07/22/2021   History of hemorrhoids 07/22/2021   COPD (chronic obstructive pulmonary disease) (HCC) 07/22/2021   History of kidney stones 07/22/2021   History of skin cancer 07/22/2021   Complication of anesthesia 07/22/2021   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HCC) 07/22/2021   Other abnormal glucose 07/22/2021   Spondylitis (HCC) 07/22/2021   Greater trochanteric pain syndrome 07/22/2021   Vertigo 07/08/2021   Hypokalemia 07/08/2021   Trochanteric bursitis of left hip 06/30/2021   Cancer (HCC) 04/11/2021   Lumbar radiculopathy 03/02/2021   Gait instability 03/02/2021   Hip pain 03/02/2021   Situational depression 03/02/2021   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   Urinary retention 11/26/2020    Cervical myelopathy (HCC) 11/24/2020   Body mass index (BMI) 25.0-25.9, adult 11/02/2020   Lumbar spondylosis 11/02/2020   Lumbago of lumbar region with sciatica 10/21/2020   Cystoid macular edema of both eyes 07/28/2020   Status post cervical spinal fusion 07/09/2020   History of total bilateral knee replacement 07/08/2020   Aortic atherosclerosis (HCC) 03/05/2020   Emphysema, unspecified (HCC) 03/05/2020   Arthritis 03/04/2020   Diverticulosis 03/03/2020   Internal hemorrhoids 03/03/2020   Weight loss 03/03/2020   Heme  positive stool 02/29/2020   Leg cramps 11/13/2019   Tibialis anterior tendon tear, nontraumatic 11/13/2019   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 07/29/2018   CPAP (continuous positive airway pressure) dependence 03/25/2018   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   Erectile dysfunction 06/20/2017   Stenosis of right carotid artery without cerebral infarction 03/06/2017   Peripheral neuropathy 06/19/2016   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   Hearing loss 02/02/2016   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   Allergic rhinitis 02/02/2016   Atypical chest pain 11/24/2015   Preventative health care 11/24/2015   Onychomycosis 06/30/2015   Degenerative disc disease, lumbar 06/30/2015   Other allergic rhinitis 02/18/2015   Deviated nasal septum 02/18/2015   RLS (restless legs syndrome) 02/18/2015   GERD (gastroesophageal reflux disease) 09/18/2014   Hyperglycemia 03/03/2014   Rotator cuff tear 07/27/2013   Sleep apnea with use of continuous positive airway pressure (CPAP) 07/15/2013   Carotid stenosis 02/24/2013   Nonspecific abnormal electrocardiogram (ECG) (EKG) 01/06/2013   DJD (degenerative joint disease) of hip 12/24/2012   Lower GI bleed 12/12/2012   Right groin pain 11/29/2012   OSA (obstructive sleep apnea) 11/25/2012   Ventral hernia 07/08/2012   High risk medication use 01/18/2012   Dyspnea 06/12/2011   Hyperlipidemia  12/27/2010   Osteoarthrosis, unspecified whether generalized or localized, pelvic region and thigh 07/25/2010   Osteoarthrosis, hand 06/13/2010   Anemia 12/17/2009   Hypothyroidism 05/13/2009   GOUT 05/13/2009   Right inguinal hernia 05/13/2009   HIATAL HERNIA 05/13/2009   NEPHROLITHIASIS 05/13/2009   ROSACEA 05/13/2009   Hypertension 05/13/2009    PCP: [name], NP   REFERRING PROVIDER: Susa Raring., MD   REFERRING DIAG:  M25.562, G89.29 (ICD-10-CM) - Chronic pain in left knee  M25.561, G89.29 (ICD-10-CM) - Chronic pain in right knee   THERAPY DIAG:  Chronic pain of both knees  Muscle weakness (generalized)  Unsteadiness on feet  Other abnormalities of gait and mobility  Repeated falls  Other low back pain  RATIONALE FOR EVALUATION AND TREATMENT: Rehabilitation  ONSET DATE: off and on for a couple years  NEXT MD VISIT: none scheduled   SUBJECTIVE:                                                                                                                                                                                                         SUBJECTIVE STATEMENT: Pt reports pain when waking up in the morning, none right now. He notes getting cramps in both calves at night.   PAIN: Are  you having pain? No - B knees  Are you having pain? Yes: NPRS scale: "uncertain"/10 Pain location: midline low back extending to sides Pain description: pulling, aching Aggravating factors: walking Relieving factors: rest    PERTINENT HISTORY:  L2-3, L3-4 Sublaminar Decompression on 12/27/22; Revision of femoral component of L THA on 07/02/22 s/p fall with periprosthetic fracture; Aortic atherosclerosis; carotid stenosis; cervical myelopathy s/p ACDF 06/2020 and posterior cervical fusion 11/2020; peripheral neuropathy; lumbar DDD/spondylosis; lumbago with sciatica/lumbar radiculopathy; OA; B TKA; B THA; L greater trochanteric pain syndrome; L RCR 2014; CTR;  hearing loss; HTN; BPPV/vertigo; B sacroiliitis; skin cancer; COPD/emphysema; sleep apnea with CPAP; GERD; gout; HLD; LE cramps; RLS   PRECAUTIONS: Fall  WEIGHT BEARING RESTRICTIONS: No  FALLS:  Has patient fallen in last 6 months? Yes. 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  No fracture or bony lesion or other findings.  Impression: Right knee replacement in good condition.   01/31/23 - L knee x-ray: No fracture or bony lesion or arthritic change or other abnormality.  Left  knee replacement looks well-fixed and in excellent position.  Impression: Knee replacement in good condition.   PATIENT SURVEYS:  LEFS 31 / 80 = 38.8 % 05/10/23: 32 / 80 = 40.0 %  COGNITION: Overall cognitive status: Within functional limits for tasks assessed and History of cognitive impairments - at baseline    SENSATION: Intermittent peripheral neuropathy in B feet  MUSCLE LENGTH: TBA Hamstrings:  ITB:  Piriformis:  Hip flexors:  Quads:  Heelcord:   POSTURE:  rounded shoulders, forward head, and flexed trunk   LOWER EXTREMITY  ROM: Limited B hip and ankle ROM Active ROM Right eval Left eval Right 04/03/23 Left 04/03/23 R/L 05/28/23  Knee flexion 104 96 110 108   Knee extension -10 -5 4 2 4  in LAQ/ 1 in LAQ   Passive ROM Right eval Left eval  Knee flexion    Knee extension 0 0  (Blank rows = not tested)  LOWER EXTREMITY MMT:  MMT Right Eval* Left Eval* R * 05/10/23 L * 05/10/23  Hip flexion 3+ 4 3+ 4-  Hip extension 3- 3+ 3+ 3+  Hip abduction 4- 4 4 4   Hip adduction 4 4 4 4   Hip internal rotation 3+ 4- 3+ 4-  Hip external rotation 2+ 3- 2+ 3-  Knee flexion 4 4 4+ 4+  Knee extension 4 4+ 4 4  Ankle dorsiflexion 4+ 4 4+ 4-  Ankle plantarflexion      Ankle inversion      Ankle eversion       (  Blank rows = not tested, *tested in sitting)  FUNCTIONAL TESTS: (Remaining tests to be assessed next visit with PT) 5 times sit to stand: 18.94 sec w/o UE assist (uncontrolled descent on most reps) (03/22/23) Timed up and go (TUG): 21.29 sec with RW (03/22/23) 10 meter walk test: 21.13 sec with RW; Gait speed = 1.55 ft/sec  Berg Balance Scale: 35/56; <36 = High risk for falls (close to 100%) (03/22/23) Dynamic Gait Index: 11/24; Scores of 19 or less are predictive of falls in older community living adults (03/22/23)  04/17/23: TUG: 20 sec  5xSTS: 18 sec  05/10/23: 5xSTS: 13.31 sec w/o UE assist TUG: 21.06 with RW, difficulty with turning : 14.25 sec with RW Gait speed: 2.30 ft/sec with RW DGI: 13/24  05/17/23: Sharlene Motts: 32/56  GAIT: Distance walked: 60 ft Assistive device utilized: Environmental consultant - 2 wheeled Level of assistance: SBA Gait pattern: step through pattern, decreased stride length, decreased hip/knee flexion- Right, decreased hip/knee flexion- Left, decreased ankle dorsiflexion- Right, decreased ankle dorsiflexion- Left, trunk flexed, poor foot clearance- Right, and poor foot clearance- Left Comments: Cues necessary for improved upright posture and rolling walker proximity   TODAY'S TREATMENT:   05/28/23 THERAPEUTIC EXERCISE: to improve flexibility, strength and mobility.  Demonstration, verbal and tactile cues throughout for technique. NuStep - L6 x 6 min Standing marching x 10 BLE Standing hip abduction x 10 BLE Standing hip extension x 10 BLE Seated pallof press GTB x 15  Seated horizontal ABD GTB x 10  Standing lateral steps with weight shift x 10 bil  05/23/23 THERAPEUTIC EXERCISE: to improve flexibility, strength and mobility.  Demonstration, verbal and tactile cues throughout for technique. NuStep - L5 x 6 min Sit to stands x 10  Seated LAQ x 5 BLE Standing hip up and over/sidestep 2# x 10 bil Seated marching 2# x 10 Seated rows 15# 2x10 low grips Seated shoulder flexion AAROM with cane x 10  Seated trunk rotation x 10 each direction   GAIT TRAINING: To normalize gait pattern and improve safety with RW.  180' with RW - cues for upright posture and improved RW proximity   05/17/23 THERAPEUTIC EXERCISE: to improve flexibility, strength and mobility.  Demonstration, verbal and tactile cues throughout for technique. NuStep - L5 x 7 min R/L standing runner's gastroc x 30" R/L negative heel gastroc stretch with toes on 2" block/book x 30" Seated B toe raises 2 x 10  THERAPEUTIC ACTIVITIES: Berg: 32/56  GAIT TRAINING: To normalize gait pattern and improve safety with RW .  120' with RW - cues for upright posture and improved RW proximity as well as increased hip and knee flexion with heel strike on weight acceptance for improved foot clearance   05/10/23 BP - 146/74, HR - 63, SpO2 - 96% prior to start of visit  THERAPEUTIC EXERCISE: to improve flexibility, strength and mobility.  Demonstration, verbal and tactile cues throughout for technique. NuStep - L4 x 7 min  THERAPEUTIC ACTIVITIES: 5xSTS: 13.31 sec w/o UE assist - better stability on standing and improved eccentric control as compared to previous testing TUG: 21.06 with RW, difficulty with turning :  14.25 sec with RW Gait speed: 2.30 ft/sec with RW DGI: 13/24 LEFS: 32 / 80 = 40.0 %  GAIT TRAINING: To normalize gait pattern and improve safety with RW . Distance walked: 220 ft Assistive device utilized: Walker - 2 wheeled Level of assistance: SBA Gait pattern: step through pattern and trunk flexed Comments: Cues for upright posture and improved  RW proximity Stairs: Level of Assistance: CGA Stair Negotiation Technique: Step to Pattern on descent as well as ascent w/o HHA, Alternating Pattern on ascent with HHA, Forwards with Single Rail on Left & HHA of PT on R (reports he uses his cane at home) Number of Stairs: 14  Height of Stairs: 7"    04/30/23 BP after sitting in waiting room ~15 minutes upon arrival to PT = 170/80 HR = 67 O2 sats = 96%  BP after walking ~50 ft = 184/80 HR = 67 O2 sats = 97%  PT session discontinued due to elevated BP   PATIENT EDUCATION:  Education details: HEP update - anti-rotation press and horiz ABD and gait safety with RW   Person educated: Patient Education method: Explanation, Demonstration, and Verbal cues Education comprehension: verbalized understanding, verbal cues required, and needs further education  HOME EXERCISE PROGRAM: Access Code: [email or web address] URL: [email or web address] Date: 05/28/2023 Prepared by: Verta Ellen  Exercises - Seated Hamstring Stretch with Strap  - 1 x daily - 7 x weekly - 3 sets - 30 sec hold - Seated Quad Set  - 1 x daily - 7 x weekly - 3 sets - 10 reps - 5 seconds hold - Seated Hip Adduction Squeeze with Ball  - 1 x daily - 7 x weekly - 3 sets - 10 reps - 5 secs hold - Supine Quad Set on Towel Roll  - 1 x daily - 7 x weekly - 2 sets - 10 reps - 3 sec hold - Hooklying Single Leg Bent Knee Fallouts with Resistance  - 1 x daily - 7 x weekly - 2 sets - 10 reps - 3 sec hold - Supine Bridge with Resistance Band  - 1 x daily - 7 x weekly - 2 sets - 10 reps - 5 sec hold - Seated Long Arc Quad  - 1 x  daily - 7 x weekly - 3 sets - 10 reps - Standing Gastroc Stretch at Counter  - 1 x daily - 7 x weekly - 3 sets - 3 reps - 30 sec hold - Seated Shoulder Horizontal Abduction with Resistance - Palms Down  - 1 x daily - 7 x weekly - 3 sets - 10 reps - Seated Anti-Rotation Press With Anchored Resistance  - 1 x daily - 7 x weekly - 3 sets - 10 reps   ASSESSMENT:  CLINICAL IMPRESSION: [name] Pratt" showed a good response to the progression of exercises. He reported increased LBP from standing exercises x 2 requiring seated breaks but pain subsided afterward. Mostly emphasized general strength to improve activity tolerance with daily activities along with core strengthening to improve LBP. Knee extension improved on L side but still lacking 4 deg on R side in LAQ.  [name] Pratt will benefit from continued skilled PT to address ongoing pain, strength and balance deficits for improved mobility with decreased pain interference and decreased risk for recurrent falls.  OBJECTIVE IMPAIRMENTS: Abnormal gait, decreased activity tolerance, decreased balance, decreased coordination, decreased endurance, decreased knowledge of condition, decreased knowledge of use of DME, decreased mobility, difficulty walking, decreased ROM, decreased strength, decreased safety awareness, increased fascial restrictions, impaired perceived functional ability, increased muscle spasms, impaired flexibility, impaired sensation, improper body mechanics, postural dysfunction, and pain.   ACTIVITY LIMITATIONS: carrying, lifting, bending, sitting, standing, squatting, sleeping, stairs, transfers, bed mobility, bathing, locomotion level, and caring for others  PARTICIPATION LIMITATIONS: meal prep, cleaning, laundry, driving, shopping, and community activity  PERSONAL FACTORS: Age, Behavior pattern, Fitness, Past/current  experiences, Time since onset of injury/illness/exacerbation, and 3+ comorbidities: L2-3, L3-4 Sublaminar Decompression on  12/27/22; Revision of femoral component of L THA on 07/02/22 s/p fall with periprosthetic fracture; Aortic atherosclerosis; carotid stenosis; 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   REHAB POTENTIAL: Good  CLINICAL DECISION MAKING: Evolving/moderate complexity  EVALUATION COMPLEXITY: Moderate   GOALS: Goals reviewed with patient? Yes  SHORT TERM GOALS: Target date: 04/10/2023, extended to 06/07/2023  Patient will be independent with initial HEP. Baseline:  Goal status: IN PROGRESS  05/10/23 - pt reports difficulty using TB due to inability to get the band over his legs and has been using his hands for manual resistance  2.  Complete balance testing and establish additional STG's/LTG's as appropriate. Baseline:  Goal status: MET  03/22/23  3.  Patient will improve 5x STS time to </= 16 seconds with good eccentric control for improved efficiency and safety with transfers Baseline: 18.94 sec w/o UE assist (uncontrolled descent on most reps) Goal status: MET  05/10/23 - 13.31 sec w/o UE assist with good stability upon standing and improved eccentric control  4.  Patient will demonstrate decreased TUG time to </= 18 sec to decrease risk for falls with transitional mobility Baseline: 21.29 sec with RW Goal status: IN PROGRESS  04/17/23 - 20 sec with RW; 05/10/23 - 21.06 sec with RW  LONG TERM GOALS: Target date: 05/08/2023, extended to 07/05/2023  Patient will be independent with advanced/ongoing HEP to improve outcomes and carryover.  Baseline:  Goal status: IN PROGRESS  05/10/23 - pt reports difficulty using TB due to  inability to get the band over his legs and has been using his hands for manual resistance  2.  Patient will report at least 50-75% improvement in B knee pain with no further instances of subluxation to improve QOL. Baseline: 5/10 Goal status: MET  05/10/23 - no recent knee pain reported  3.  Patient will demonstrate improved B knee extension AROM to [facility] to allow for improved stability for normal gait and stair mechanics. Baseline: Refer to above LE ROM table Goal status: IN PROGRESS  05/28/23 - refer to ROM table  4.  Patient will demonstrate improved B LE strength to >/= 4 to 4+/5 for improved stability and ease of mobility. Baseline: Refer to above LE MMT table Goal status: IN PROGRESS  05/10/23 - overall LE strength essentially unchanged  5.  Patient will be able to ambulate 400' with LRAD and normal gait pattern without increased pain to access community.  Baseline:  Goal status: IN PROGRESS  05/10/23 - 200' with RW and frequent cues for upright posture and improved RW proximity  6. Patient will be able to ascend/descend stairs with 1 HR and reciprocal step pattern safely to access home and community.  Baseline:  Goal status: IN PROGRESS  05/10/23 - able to complete reciprocal ascent with left HR and R HHA/CGA of PT (reports he uses cane for other hand at home), step to pattern on descent with continued need for R HHA/CGA PT  7.  Patient will report >/= 40/80 on LEFS to demonstrate improved functional ability. Baseline: 31 / 80 = 38.8 % Goal status: IN PROGRESS  05/10/23 -  32 / 80 = 40.0 %  8.  Patient will demonstrate at least 19/24 on DGI to decrease risk of falls. Baseline: 11/24 (03/22/23) Goal status: IN PROGRESS  05/10/23 - 13/241  9.  Patient will improve Berg score by at least 8 points to improve safety and stability with ADLs in standing and reduce risk for falls. Baseline: 35/56 (03/22/23) Goal status: IN PROGRESS  05/17/23 - 32/56   PLAN:  PT FREQUENCY: 2x/week  PT  DURATION: 6-8 weeks  PLANNED INTERVENTIONS: Therapeutic exercises, Therapeutic activity, Neuromuscular re-education, Balance training, Gait training, Patient/Family education, Self Care, Joint mobilization, Stair training, DME instructions, Aquatic Therapy, Dry Needling, Electrical stimulation, Spinal mobilization, Cryotherapy, Moist heat, Taping, Ultrasound, and Manual therapy  PLAN FOR NEXT SESSION: core/lumbopelvic/B LE flexibility and strengthening; balance training; review and update/progress HEP as indicated - look into alternative options for Thera-Band vs other types of resistance as pt reports difficulty donning band around legs; if forward flexed posture with gait persists, consider transition to standard RW   Darleene Cleaver, PTA 05/28/2023, 12:37 PM

## 2023-05-30 ENCOUNTER — Encounter: Payer: Self-pay | Admitting: Physical Therapy

## 2023-05-30 ENCOUNTER — Ambulatory Visit: Payer: Medicare HMO | Admitting: Physical Therapy

## 2023-05-30 DIAGNOSIS — R2681 Unsteadiness on feet: Secondary | ICD-10-CM

## 2023-05-30 DIAGNOSIS — M25562 Pain in left knee: Secondary | ICD-10-CM | POA: Diagnosis not present

## 2023-05-30 DIAGNOSIS — M6281 Muscle weakness (generalized): Secondary | ICD-10-CM | POA: Diagnosis not present

## 2023-05-30 DIAGNOSIS — M5459 Other low back pain: Secondary | ICD-10-CM | POA: Diagnosis not present

## 2023-05-30 DIAGNOSIS — R2689 Other abnormalities of gait and mobility: Secondary | ICD-10-CM

## 2023-05-30 DIAGNOSIS — R296 Repeated falls: Secondary | ICD-10-CM

## 2023-05-30 DIAGNOSIS — G8929 Other chronic pain: Secondary | ICD-10-CM

## 2023-05-30 DIAGNOSIS — M25561 Pain in right knee: Secondary | ICD-10-CM | POA: Diagnosis not present

## 2023-05-30 NOTE — Therapy (Signed)
OUTPATIENT PHYSICAL THERAPY TREATMENT    Patient Name: [name] Pratt MRN: [id number] DOB:09/11/1941, 82 y.o., male Today's Date: 05/30/2023   END OF SESSION:  PT End of Session - 05/30/23 1148     Visit Number 14    Date for PT Re-Evaluation 07/05/23    Authorization Type [name]    Authorization Time Period 05/10/23 - 07/05/23    Authorization - Visit Number 5    Authorization - Number of Visits 12    Progress Note Due on Visit 20    PT Start Time 1148    PT Stop Time 1234    PT Time Calculation (min) 46 min    Activity Tolerance Patient tolerated treatment well    Behavior During Therapy [facility] for tasks assessed/performed                       Past Medical History:  Diagnosis Date   Allergic rhinitis 02/02/2016   Anemia 12/17/2009   Formatting of this note might be different from the original. 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 Hypertension   Benign paroxysmal positional vertigo 09/14/2021   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HCC) 07/22/2021   Body mass index (BMI) 25.0-25.9, adult 11/02/2020   Cancer (HCC)    skin cancer   Cardiac murmur 07/25/2021   Carotid stenosis    a. Carotid U/S 5/13: LICA < 50%, RICA 50-69%;  b.  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   Cystoid macular edema of both eyes 07/28/2020    Degenerative disc disease, lumbar 06/30/2015   Deviated nasal septum 02/18/2015   Diverticulosis 03/03/2020   DJD (degenerative joint disease) of hip 12/24/2012   Dyspnea 06/12/2011   Emphysema, unspecified (HCC) 03/05/2020   Erectile dysfunction 06/20/2017   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   Gait disorder 03/02/2021   GERD (gastroesophageal reflux disease)    GOUT 05/13/2009   Qualifier: Diagnosis of  By: Kem Parkinson     Greater trochanteric pain syndrome 07/22/2021   Hand pain, left 01/16/2022   Hearing loss 02/02/2016   Heme positive stool 02/29/2020   HIATAL HERNIA 05/13/2009   Qualifier: Diagnosis of  By: Kem Parkinson     High risk medication use 01/18/2012   Hip pain 03/02/2021   History of chicken pox    History of COVID-19 12/05/2021   History of hemorrhoids    History of kidney stones    History of skin cancer 07/22/2021   skin cancer   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 Cardiology - Dr Estrella Myrtle at [facility] cardiology  ICD-10 cut over   Hypertension    under control  Hypokalemia 07/08/2021   Hypothyroidism    Internal hemorrhoids 03/03/2020   Leg cramps 11/13/2019   Lower GI bleed 12/12/2012   Lumbago of lumbar region with sciatica 10/21/2020   Lumbar radiculopathy 03/02/2021   Lumbar spondylosis 11/02/2020   NEPHROLITHIASIS 05/13/2009   Qualifier: Diagnosis of  By: Kem Parkinson     Nonspecific abnormal electrocardiogram (ECG) (EKG) 01/06/2013   Numbness of hand 01/24/2022   Onychomycosis 06/30/2015   Osteoarthrosis, 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 Osteoarthritis Of The Hand  10/1 IMO update   Osteoarthrosis, unspecified whether generalized or localized,  pelvic region and thigh 07/25/2010   Formatting of this note might be different from the original. Osteoarthritis Of The Hip   Other abnormal glucose 07/22/2021   Other allergic rhinitis 02/18/2015   Palpitations 07/25/2021   Peripheral neuropathy 06/19/2016   Preventative health care 11/24/2015   Right groin pain 11/29/2012   Right inguinal hernia 05/13/2009   Qualifier: Diagnosis of  By: Kem Parkinson     RLS (restless legs syndrome) 02/18/2015   Rosacea    Rotator cuff tear 07/27/2013   Situational depression 03/02/2021   Skin lesion 01/16/2022   Sleep apnea with use of continuous positive airway pressure (CPAP) 07/15/2013   CPAP set to  7 cm water,  Residual AHi was 7.8  With no leak.  User time 6 hours.  479 days , not used only 12 days - highly compliant 06-23-13  .    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 Fawn Kirk   Ulnar neuropathy 01/24/2022   Urinary retention 11/26/2020   Ventral hernia 07/08/2012   Vertigo 07/08/2021   Weight loss 03/03/2020   Past Surgical History:  Procedure Laterality Date   ABDOMINAL HERNIA REPAIR  07/15/12   Dr Ashley Jacobs   ANTERIOR CERVICAL DECOMP/DISCECTOMY FUSION N/A 07/09/2020   Procedure: ANTERIOR CERVICAL DECOMPRESSION AND FUSION CERVICAL THREE-FOUR.;  Surgeon: Donalee Citrin, MD;  Location: [facility] OR;  Service: Neurosurgery;  Laterality: N/A;  anterior   BROW LIFT  05/07/01   COLONOSCOPY WITH PROPOFOL N/A 04/24/2022   Procedure: COLONOSCOPY WITH PROPOFOL;  Surgeon: Tressia Danas, MD;  Location: [facility] ENDOSCOPY;  Service: Gastroenterology;  Laterality: N/A;   COLONOSCOPY WITH PROPOFOL N/A 04/26/2022   Procedure: COLONOSCOPY WITH PROPOFOL;  Surgeon: Tressia Danas, MD;  Location: [facility] ENDOSCOPY;  Service: Gastroenterology;  Laterality: N/A;   CYSTOSCOPY WITH URETEROSCOPY AND STENT PLACEMENT Right 01/28/2014   Procedure: CYSTOSCOPY WITH URETEROSCOPY, BASKET RETRIVAL AND  STENT PLACEMENT;  Surgeon: Valetta Fuller, MD;  Location: [facility] ORS;  Service: Urology;  Laterality: Right;   epidural injections     multiple procedures   ESOPHAGEAL DILATION  1993 and 1994   multiple times   EYE SURGERY Bilateral 03/25/10, 2012   cataract removal   HEMORRHOID SURGERY  2014   HIATAL HERNIA REPAIR  12/21/93   HOLMIUM LASER APPLICATION Right 01/28/2014   Procedure: HOLMIUM LASER APPLICATION;  Surgeon: Valetta Fuller, MD;  Location: [facility] ORS;  Service: Urology;  Laterality: Right;   INGUINAL HERNIA REPAIR Right 07/15/12   Dr Ashley Jacobs, x2   JOINT REPLACEMENT  07/25/04   right knee   JOINT REPLACEMENT  07/13/10  left knee   KNEE SURGERY  1995   LAPAROSCOPIC CHOLECYSTECTOMY  09/20/06   with intraoperative cholangiogram and right inguinal herniorrhaphy with mesh    LIGAMENT REPAIR Left 07/2013   shoulder   LITHOTRIPSY     x2   LUMBAR LAMINECTOMY/DECOMPRESSION MICRODISCECTOMY Bilateral 12/27/2022   Procedure: Lumbar Two-Three, Lumbar Three-Four Sublaminar Decompression;  Surgeon: Donalee Citrin, MD;  Location: [facility] OR;  Service: Neurosurgery;  Laterality: Bilateral;   NASAL SEPTUM SURGERY  1975   POLYPECTOMY  04/26/2022   Procedure: POLYPECTOMY;  Surgeon: Tressia Danas, MD;  Location: [facility] ENDOSCOPY;  Service: Gastroenterology;;   POSTERIOR CERVICAL FUSION/FORAMINOTOMY N/A 11/24/2020   Procedure: Posterior Cervical Laminectomy Cervical three-four, Cervical four-five with lateral mass fusion;  Surgeon: Donalee Citrin, MD;  Location: [facility] OR;  Service: Neurosurgery;  Laterality: N/A;   REFRACTIVE SURGERY Bilateral    Right total hip replacement  4/14   SKIN CANCER DESTRUCTION     nose, ear   TONSILLECTOMY  as child   TOTAL HIP ARTHROPLASTY Left    URETHRAL DILATION  1991   Dr.  Annabell Howells   Patient Active Problem List   Diagnosis Date Noted   Spinal stenosis of lumbar region 12/27/2022   Drowsiness 12/07/2022   Preop examination 12/06/2022   Weakness of lower extremity 10/10/2022   Skin tear of right elbow without complication 09/18/2022   Skin tear of forearm without complication, left, subsequent encounter 09/18/2022   Fall 09/18/2022   Iron deficiency anemia 09/18/2022   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   Vitamin D deficiency 08/07/2022   Closed fracture of left hip (HCC) 08/07/2022   Hand weakness 05/02/2022   Acute kidney injury (HCC) 05/02/2022   Adenomatous polyp of ascending colon    Adenomatous polyp of colon    Acute metabolic encephalopathy 04/24/2022   GI bleed 04/21/2022   Vasovagal syncope    Hemorrhagic shock (HCC)    Hematochezia    Irregular heart beat 02/03/2022   Constipation 02/03/2022   Carpal tunnel syndrome 01/24/2022   Numbness of hand 01/24/2022   Ulnar neuropathy 01/24/2022   Skin lesion 01/16/2022   History of COVID-19 12/05/2021   Benign paroxysmal positional vertigo 09/14/2021   Palpitations 07/25/2021   Cardiac murmur 07/25/2021   History of chicken pox 07/22/2021   History of hemorrhoids 07/22/2021   COPD (chronic obstructive pulmonary disease) (HCC) 07/22/2021   History of kidney stones 07/22/2021   History of skin cancer 07/22/2021   Complication of anesthesia 07/22/2021   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HCC) 07/22/2021   Other abnormal glucose 07/22/2021   Spondylitis (HCC) 07/22/2021   Greater trochanteric pain syndrome 07/22/2021   Vertigo 07/08/2021   Hypokalemia 07/08/2021   Trochanteric bursitis of left hip 06/30/2021   Cancer (HCC) 04/11/2021   Lumbar radiculopathy 03/02/2021   Gait instability 03/02/2021   Hip pain 03/02/2021   Situational depression 03/02/2021   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   Urinary retention 11/26/2020    Cervical myelopathy (HCC) 11/24/2020   Body mass index (BMI) 25.0-25.9, adult 11/02/2020   Lumbar spondylosis 11/02/2020   Lumbago of lumbar region with sciatica 10/21/2020   Cystoid macular edema of both eyes 07/28/2020   Status post cervical spinal fusion 07/09/2020   History of total bilateral knee replacement 07/08/2020   Aortic atherosclerosis (HCC) 03/05/2020   Emphysema, unspecified (HCC) 03/05/2020   Arthritis 03/04/2020   Diverticulosis 03/03/2020   Internal hemorrhoids 03/03/2020   Weight loss 03/03/2020   Heme  positive stool 02/29/2020   Leg cramps 11/13/2019   Tibialis anterior tendon tear, nontraumatic 11/13/2019   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 07/29/2018   CPAP (continuous positive airway pressure) dependence 03/25/2018   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   Erectile dysfunction 06/20/2017   Stenosis of right carotid artery without cerebral infarction 03/06/2017   Peripheral neuropathy 06/19/2016   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   Hearing loss 02/02/2016   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   Allergic rhinitis 02/02/2016   Atypical chest pain 11/24/2015   Preventative health care 11/24/2015   Onychomycosis 06/30/2015   Degenerative disc disease, lumbar 06/30/2015   Other allergic rhinitis 02/18/2015   Deviated nasal septum 02/18/2015   RLS (restless legs syndrome) 02/18/2015   GERD (gastroesophageal reflux disease) 09/18/2014   Hyperglycemia 03/03/2014   Rotator cuff tear 07/27/2013   Sleep apnea with use of continuous positive airway pressure (CPAP) 07/15/2013   Carotid stenosis 02/24/2013   Nonspecific abnormal electrocardiogram (ECG) (EKG) 01/06/2013   DJD (degenerative joint disease) of hip 12/24/2012   Lower GI bleed 12/12/2012   Right groin pain 11/29/2012   OSA (obstructive sleep apnea) 11/25/2012   Ventral hernia 07/08/2012   High risk medication use 01/18/2012   Dyspnea 06/12/2011   Hyperlipidemia  12/27/2010   Osteoarthrosis, unspecified whether generalized or localized, pelvic region and thigh 07/25/2010   Osteoarthrosis, hand 06/13/2010   Anemia 12/17/2009   Hypothyroidism 05/13/2009   GOUT 05/13/2009   Right inguinal hernia 05/13/2009   HIATAL HERNIA 05/13/2009   NEPHROLITHIASIS 05/13/2009   ROSACEA 05/13/2009   Hypertension 05/13/2009    PCP: [name], NP   REFERRING PROVIDER: Susa Raring., MD   REFERRING DIAG:  M25.562, G89.29 (ICD-10-CM) - Chronic pain in left knee  M25.561, G89.29 (ICD-10-CM) - Chronic pain in right knee   THERAPY DIAG:  Chronic pain of both knees  Muscle weakness (generalized)  Unsteadiness on feet  Other abnormalities of gait and mobility  Repeated falls  Other low back pain  RATIONALE FOR EVALUATION AND TREATMENT: Rehabilitation  ONSET DATE: off and on for a couple years  NEXT MD VISIT: none scheduled   SUBJECTIVE:                                                                                                                                                                                                         SUBJECTIVE STATEMENT: Pt reports he is still getting cramps in both calves at night even when he stretches before he goes to bed. Also notes some discomfort  in his back when he goes to get up in the morning, but no pain currently.   PAIN: Are you having pain? No - B knees  Are you having pain? No - low back   PERTINENT HISTORY:  L2-3, L3-4 Sublaminar Decompression on 12/27/22; Revision of femoral component of L THA on 07/02/22 s/p fall with periprosthetic fracture; Aortic atherosclerosis; carotid stenosis; cervical myelopathy s/p ACDF 06/2020 and posterior cervical fusion 11/2020; peripheral neuropathy; lumbar DDD/spondylosis; lumbago with sciatica/lumbar radiculopathy; OA; B TKA; B THA; L greater trochanteric pain syndrome; L RCR 2014; CTR; hearing loss; HTN; BPPV/vertigo; B sacroiliitis; skin cancer;  COPD/emphysema; sleep apnea with CPAP; GERD; gout; HLD; LE cramps; RLS   PRECAUTIONS: Fall  WEIGHT BEARING RESTRICTIONS: No  FALLS:  Has patient fallen in last 6 months? Yes. 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  No fracture or bony lesion or other findings.  Impression: Right knee replacement in good condition.   01/31/23 - L knee x-ray: No fracture or bony lesion or arthritic change or other abnormality.  Left  knee replacement looks well-fixed and in excellent position.  Impression: Knee replacement in good condition.   PATIENT SURVEYS:  LEFS 31 / 80 = 38.8 % 05/10/23: 32 / 80 = 40.0 %  COGNITION: Overall cognitive status: Within functional limits for tasks assessed and History of cognitive impairments - at baseline    SENSATION: Intermittent peripheral neuropathy in B feet  MUSCLE LENGTH: TBA Hamstrings:  ITB:  Piriformis:  Hip flexors:  Quads:  Heelcord:   POSTURE:  rounded shoulders, forward head, and flexed trunk   LOWER EXTREMITY ROM: Limited B hip and ankle ROM Active ROM Right eval Left eval  Right 04/03/23 Left 04/03/23 R/L 05/28/23  Knee flexion 104 96 110 108   Knee extension -10 -5 4 2 4  in LAQ/ 1 in LAQ   Passive ROM Right eval Left eval  Knee flexion    Knee extension 0 0  (Blank rows = not tested)  LOWER EXTREMITY MMT:  MMT Right Eval* Left Eval* R * 05/10/23 L * 05/10/23  Hip flexion 3+ 4 3+ 4-  Hip extension 3- 3+ 3+ 3+  Hip abduction 4- 4 4 4   Hip adduction 4 4 4 4   Hip internal rotation 3+ 4- 3+ 4-  Hip external rotation 2+ 3- 2+ 3-  Knee flexion 4 4 4+ 4+  Knee extension 4 4+ 4 4  Ankle dorsiflexion 4+ 4 4+ 4-  Ankle plantarflexion      Ankle inversion      Ankle eversion       (  Blank rows = not tested, *tested in sitting)  FUNCTIONAL TESTS: (Remaining tests to be assessed next visit with PT) 5 times sit to stand: 18.94 sec w/o UE assist (uncontrolled descent on most reps) (03/22/23) Timed up and go (TUG): 21.29 sec with RW (03/22/23) 10 meter walk test: 21.13 sec with RW; Gait speed = 1.55 ft/sec  Berg Balance Scale: 35/56; <36 = High risk for falls (close to 100%) (03/22/23) Dynamic Gait Index: 11/24; Scores of 19 or less are predictive of falls in older community living adults (03/22/23)  04/17/23: TUG: 20 sec  5xSTS: 18 sec  05/10/23: 5xSTS: 13.31 sec w/o UE assist TUG: 21.06 with RW, difficulty with turning : 14.25 sec with RW Gait speed: 2.30 ft/sec with RW DGI: 13/24  05/17/23: Sharlene Motts: 32/56  GAIT: Distance walked: 60 ft Assistive device utilized: Environmental consultant - 2 wheeled Level of assistance: SBA Gait pattern: step through pattern, decreased stride length, decreased hip/knee flexion- Right, decreased hip/knee flexion- Left, decreased ankle dorsiflexion- Right, decreased ankle dorsiflexion- Left, trunk flexed, poor foot clearance- Right, and poor foot clearance- Left Comments: Cues necessary for improved upright posture and rolling walker proximity   TODAY'S TREATMENT:   05/30/23 THERAPEUTIC EXERCISE: to improve flexibility, strength and  mobility.  Demonstration, verbal and tactile cues throughout for technique. NuStep - L6 x 8 min R/L negative heel gastroc stretch with toes on 2" block/book 3 x 30" Standing heel/toe weight shift/raises 2 x 10, UE support on counter Standing hip and knee flexion march + DF 2 x 10, 2nd set with looped YTB around forefoot; UE support on counter Seated R/L YTB ankle DF 2 x 10 each  NEUROMUSCULAR RE-EDUCATION: To improve balance, proprioception, coordination, reduce fall risk, amplitude of movement, and reduce rigidity. Alt toe clears to 9" step x 20 - emphasis on active DF for toe clearance at edge of step and targeting heel touch on step to keep DF's engaged; UE support on counter and back of chair for balance Alt step-over 1/2 FR and back focusing on upright posture with exaggerated hip and knee flexion and ankle DF targeting heel strike on weight acceptance through flat foot on fwd leg with initiation of toe-off on trailing leg, then initiating step back to starting position with active DF progressing into hip and knee flexion for clearance over 1/2 FR; UE support on counter and back of chair for balance  GAIT TRAINING: To normalize gait pattern and improve safety with FWW/RW . 180 ft with FWW - focusing on upright posture with exaggerated hip and knee flexion and ankle DF targeting heel strike on weight acceptance - cues to avoid excessive stride length to improve RW proximity    05/28/23 THERAPEUTIC EXERCISE: to improve flexibility, strength and mobility.  Demonstration, verbal and tactile cues throughout for technique. NuStep - L6 x 6 min Standing marching x 10 BLE Standing hip abduction x 10 BLE Standing hip extension x 10 BLE Seated pallof press GTB x 15  Seated horizontal ABD GTB x 10  Standing lateral steps with weight shift x 10 bil   05/23/23 THERAPEUTIC EXERCISE: to improve flexibility, strength and mobility.  Demonstration, verbal and tactile cues throughout for technique. NuStep  - L5 x 6 min Sit to stands x 10  Seated LAQ x 5 BLE Standing hip up and over/sidestep 2# x 10 bil Seated marching 2# x 10 Seated rows 15# 2x10 low grips Seated shoulder flexion AAROM with cane x 10  Seated trunk rotation x 10 each direction  GAIT  TRAINING: To normalize gait pattern and improve safety with RW.  180' with RW - cues for upright posture and improved RW proximity    PATIENT EDUCATION:  Education details: HEP update - anti-rotation press and horiz ABD and gait safety with RW   Person educated: Patient Education method: Explanation, Demonstration, and Verbal cues Education comprehension: verbalized understanding, verbal cues required, and needs further education  HOME EXERCISE PROGRAM: Access Code: [email or web address] URL: [email or web address] Date: 05/28/2023 Prepared by: Verta Ellen  Exercises - Seated Hamstring Stretch with Strap  - 1 x daily - 7 x weekly - 3 sets - 30 sec hold - Seated Quad Set  - 1 x daily - 7 x weekly - 3 sets - 10 reps - 5 seconds hold - Seated Hip Adduction Squeeze with Ball  - 1 x daily - 7 x weekly - 3 sets - 10 reps - 5 secs hold - Supine Quad Set on Towel Roll  - 1 x daily - 7 x weekly - 2 sets - 10 reps - 3 sec hold - Hooklying Single Leg Bent Knee Fallouts with Resistance  - 1 x daily - 7 x weekly - 2 sets - 10 reps - 3 sec hold - Supine Bridge with Resistance Band  - 1 x daily - 7 x weekly - 2 sets - 10 reps - 5 sec hold - Seated Long Arc Quad  - 1 x daily - 7 x weekly - 3 sets - 10 reps - Standing Gastroc Stretch at Counter  - 1 x daily - 7 x weekly - 3 sets - 3 reps - 30 sec hold - Seated Shoulder Horizontal Abduction with Resistance - Palms Down  - 1 x daily - 7 x weekly - 3 sets - 10 reps - Seated Anti-Rotation Press With Anchored Resistance  - 1 x daily - 7 x weekly - 3 sets - 10 reps   ASSESSMENT:  CLINICAL IMPRESSION: Amous Boateng" denies pain currently. He reports his greatest issue is still with walking, feeling like  he has trouble picking up his feet and clearing his feet especially on carpet or walking outside. Therapeutic exercises and activities focusing on reducing muscle tension/tightness in calves while promoting increased active DF as well as hip and knee flexion for improved foot clearance during gait. Increased difficulty with tasks/exercises on L due to ankle weakness while proximal LE weakness creating greater challenge on R. Promoted carryover into gait, but cues necessary to avoid excessive stride length in order to maintain upright posture and good RW proximity. [name] will benefit from continued skilled PT to address ongoing intermittent pain, strength and balance deficits for improved mobility with decreased pain interference and decreased risk for recurrent falls.  OBJECTIVE IMPAIRMENTS: Abnormal gait, decreased activity tolerance, decreased balance, decreased coordination, decreased endurance, decreased knowledge of condition, decreased knowledge of use of DME, decreased mobility, difficulty walking, decreased ROM, decreased strength, decreased safety awareness, increased fascial restrictions, impaired perceived functional ability, increased muscle spasms, impaired flexibility, impaired sensation, improper body mechanics, postural dysfunction, and pain.   ACTIVITY LIMITATIONS: carrying, lifting, bending, sitting, standing, squatting, sleeping, stairs, transfers, bed mobility, bathing, locomotion level, and caring for others  PARTICIPATION LIMITATIONS: meal prep, cleaning, laundry, driving, shopping, and community activity  PERSONAL FACTORS: Age, Behavior pattern, Fitness, Past/current experiences, Time since onset of injury/illness/exacerbation, and 3+ comorbidities: L2-3, L3-4 Sublaminar Decompression on 12/27/22; Revision of femoral component of L THA on 07/02/22 s/p fall with periprosthetic fracture; Aortic atherosclerosis; carotid stenosis; cervical myelopathy s/p ACDF  06/2020 and posterior cervical  fusion 11/2020; peripheral neuropathy; lumbar DDD/spondylosis; lumbago with sciatica/lumbar radiculopathy; OA; B TKA; B THA; L greater trochanteric pain syndrome; L RCR 2014; CTR; hearing loss; HTN; BPPV/vertigo; B sacroiliitis; skin cancer; COPD/emphysema; sleep apnea with CPAP; GERD; gout; HLD; LE cramps; RLS   are also affecting patient's functional outcome.   REHAB POTENTIAL: Good  CLINICAL DECISION MAKING: Evolving/moderate complexity  EVALUATION COMPLEXITY: Moderate   GOALS: Goals reviewed with patient? Yes  SHORT TERM GOALS: Target date: 04/10/2023, extended to 06/07/2023  Patient will be independent with initial HEP. Baseline:  Goal status: IN PROGRESS  05/10/23 - pt reports difficulty using TB due to inability to get the band over his legs and has been using his hands for manual resistance  2.  Complete balance testing and establish additional STG's/LTG's as appropriate. Baseline:  Goal status: MET  03/22/23  3.  Patient will improve 5x STS time to </= 16 seconds with good eccentric control for improved efficiency and safety with transfers Baseline: 18.94 sec w/o UE assist (uncontrolled descent on most reps) Goal status: MET  05/10/23 - 13.31 sec w/o UE assist with good stability upon standing and improved eccentric control  4.  Patient will demonstrate decreased TUG time to </= 18 sec to decrease risk for falls with transitional mobility Baseline: 21.29 sec with RW Goal status: IN PROGRESS  04/17/23 - 20 sec with RW; 05/10/23 - 21.06 sec with RW  LONG TERM GOALS: Target date: 05/08/2023, extended to 07/05/2023  Patient will be independent with advanced/ongoing HEP to improve outcomes and carryover.  Baseline:  Goal status: IN PROGRESS  05/10/23 - pt reports difficulty using TB due to inability to get the band over his legs and has been using his hands for manual resistance  2.  Patient will report at least 50-75% improvement in B knee pain with no further instances of subluxation  to improve QOL. Baseline: 5/10 Goal status: MET  05/10/23 - no recent knee pain reported  3.  Patient will demonstrate improved B knee extension AROM to [facility] to allow for improved stability for normal gait and stair mechanics. Baseline: Refer to above LE ROM table Goal status: IN PROGRESS  05/28/23 - refer to ROM table  4.  Patient will demonstrate improved B LE strength to >/= 4 to 4+/5 for improved stability and ease of mobility. Baseline: Refer to above LE MMT table Goal status: IN PROGRESS  05/10/23 - overall LE strength essentially unchanged  5.  Patient will be able to ambulate 400' with LRAD and normal gait pattern without increased pain to access community.  Baseline:  Goal status: IN PROGRESS  05/10/23 - 200' with RW and frequent cues for upright posture and improved RW proximity  6. Patient will be able to ascend/descend stairs with 1 HR and reciprocal step pattern safely to access home and community.  Baseline:  Goal status: IN PROGRESS  05/10/23 - able to complete reciprocal ascent with left HR and R HHA/CGA of PT (reports he uses cane for other hand at home), step to pattern on descent with continued need for R HHA/CGA PT  7.  Patient will report >/= 40/80 on LEFS to demonstrate improved functional ability. Baseline: 31 / 80 = 38.8 % Goal status: IN PROGRESS  05/10/23 - 32 / 80 = 40.0 %  8.  Patient will demonstrate at least 19/24 on DGI to decrease risk of falls. Baseline: 11/24 (03/22/23) Goal status: IN PROGRESS  05/10/23 - 13/241  9.  Patient will improve Berg score by at least 8 points to improve safety and stability with ADLs in standing and reduce risk for falls. Baseline: 35/56 (03/22/23) Goal status: IN PROGRESS  05/17/23 - 32/56   PLAN:  PT FREQUENCY: 2x/week  PT DURATION: 6-8 weeks  PLANNED INTERVENTIONS: Therapeutic exercises, Therapeutic activity, Neuromuscular re-education, Balance training, Gait training, Patient/Family education, Self Care, Joint mobilization,  Stair training, DME instructions, Aquatic Therapy, Dry Needling, Electrical stimulation, Spinal mobilization, Cryotherapy, Moist heat, Taping, Ultrasound, and Manual therapy  PLAN FOR NEXT SESSION: core/lumbopelvic/B LE flexibility and strengthening; balance training; review and update/progress HEP as indicated - look into alternative options for Thera-Band vs other types of resistance as pt reports difficulty donning band around legs; if forward flexed posture with gait persists, consider transition to standard RW   Marry Guan, PT 05/30/2023, 12:45 PM

## 2023-06-04 ENCOUNTER — Ambulatory Visit: Payer: Self-pay | Admitting: Licensed Clinical Social Worker

## 2023-06-04 NOTE — Patient Instructions (Signed)
Visit Information  Thank you for taking time to visit with me today. Please don't hesitate to contact me if I can be of assistance to you.   Following are the goals we discussed today:   Goals Addressed             This Visit's Progress    Patient is recovering from back surgery, He uses a cane or a walker as needed to help him walk       Interventions:  Spoke with Margette Fast, spouse of client, about client needs Discussed program support with RN, LCSW, Pharmacist Discussed client ambulation. He uses a cane or a walker as needed to help him walk. He is recovering from back surgery Discussed medication procurement of client Discussed mood of client. Client mood is stable. No mood issues noted. Client likes to watch TV. Client likes to go out to eat occasionally. Client likes to spend time on computer.  Discussed transport needs. Client is still driving at present Discussed appetite of client Discussed support of client with his medical providers Encouraged client  or Nieem Rayford to call LCSW as needed at [phone].for SW support for client         Our next appointment is by telephone on 07/23/23 at 11:00 AM   Please call the care guide team at [phone] if you need to cancel or reschedule your appointment.   If you are experiencing a Mental Health or Behavioral Health Crisis or need someone to talk to, please go to [facility] [address] [place] [phone])   The patient / Trayshaun Clere, spouse,verbalized understanding of instructions, educational materials, and care plan provided today and DECLINED offer to receive copy of patient instructions, educational materials, and care plan.   The patient / Yeltsin Hishmeh, spouse,  has been provided with contact information for the care management team and has been advised to call with any health related questions or concerns.   Kelton Pillar.Anaalicia Reimann MSW, LCSW Licensed  Visual merchandiser [facility] Care Management [phone]

## 2023-06-04 NOTE — Patient Outreach (Signed)
Care Coordination   Follow Up Visit Note   06/04/2023 Name: [name] Pratt MRN: [id number] DOB: 11-08-1940  [name] Pratt is a 82 y.o. year old male who sees [name] Craze, NP for primary care. I spoke with  [name] Pratt / [name] Pratt, spouse of client via phone today.  What matters to the patients health and wellness today?  Patient is recovering from back surgery. He uses a cane or a walker as needed to help him walk    Goals Addressed             This Visit's Progress    Patient is recovering from back surgery, He uses a cane or a walker as needed to help him walk       Interventions:  Spoke with [name] Pratt, spouse of client, about client needs Discussed program support with RN, LCSW, Pharmacist Discussed client ambulation. He uses a cane or a walker as needed to help him walk. He is recovering from back surgery Discussed medication procurement of client Discussed mood of client. Client mood is stable. No mood issues noted. Client likes to watch TV. Client likes to go out to eat occasionally. Client likes to spend time on computer.  Discussed transport needs. Client is still driving at present Discussed appetite of client Discussed support of client with his medical providers Encouraged client  or [name] Pratt to call LCSW as needed at [phone].for SW support for client         SDOH assessments and interventions completed:  Yes  SDOH Interventions Today    Flowsheet Row Most Recent Value  SDOH Interventions   Physical Activity Interventions Other (Comments)  [occasional mobility challenges]  Stress Interventions Other (Comment)  [has stress in managing medical needs]        Care Coordination Interventions:  Yes, provided   Interventions Today    Flowsheet Row Most Recent Value  Chronic Disease   Chronic disease during today's visit Other  [spoke with [name] Pratt, spouse of client, about client needs]  Exercise  Interventions   Exercise Discussed/Reviewed Physical Activity  Education Interventions   Education Provided Provided Education  Provided Verbal Education On Community Resources  Mental Health Interventions   Mental Health Discussed/Reviewed Coping Strategies  [no mood issues noted]  Nutrition Interventions   Nutrition Discussed/Reviewed Nutrition Discussed  Pharmacy Interventions   Pharmacy Dicussed/Reviewed Pharmacy Topics Discussed  Safety Interventions   Safety Discussed/Reviewed Fall Risk        Follow up plan: Follow up call scheduled for 07/23/23 at 11:00 AM    Encounter Outcome:  Patient Visit Completed   Kelton Pillar.Shaquia Berkley MSW, LCSW Licensed Visual merchandiser [facility] Care Management [phone]

## 2023-06-06 ENCOUNTER — Ambulatory Visit: Payer: Medicare HMO

## 2023-06-06 DIAGNOSIS — M5459 Other low back pain: Secondary | ICD-10-CM | POA: Diagnosis not present

## 2023-06-06 DIAGNOSIS — M6281 Muscle weakness (generalized): Secondary | ICD-10-CM | POA: Diagnosis not present

## 2023-06-06 DIAGNOSIS — M25562 Pain in left knee: Secondary | ICD-10-CM | POA: Diagnosis not present

## 2023-06-06 DIAGNOSIS — R2681 Unsteadiness on feet: Secondary | ICD-10-CM

## 2023-06-06 DIAGNOSIS — N1832 Chronic kidney disease, stage 3b: Secondary | ICD-10-CM | POA: Diagnosis not present

## 2023-06-06 DIAGNOSIS — G8929 Other chronic pain: Secondary | ICD-10-CM | POA: Diagnosis not present

## 2023-06-06 DIAGNOSIS — M25561 Pain in right knee: Secondary | ICD-10-CM | POA: Diagnosis not present

## 2023-06-06 DIAGNOSIS — R296 Repeated falls: Secondary | ICD-10-CM | POA: Diagnosis not present

## 2023-06-06 DIAGNOSIS — R2689 Other abnormalities of gait and mobility: Secondary | ICD-10-CM

## 2023-06-06 DIAGNOSIS — N2581 Secondary hyperparathyroidism of renal origin: Secondary | ICD-10-CM | POA: Diagnosis not present

## 2023-06-06 DIAGNOSIS — D649 Anemia, unspecified: Secondary | ICD-10-CM | POA: Diagnosis not present

## 2023-06-06 DIAGNOSIS — I129 Hypertensive chronic kidney disease with stage 1 through stage 4 chronic kidney disease, or unspecified chronic kidney disease: Secondary | ICD-10-CM | POA: Diagnosis not present

## 2023-06-06 NOTE — Therapy (Signed)
OUTPATIENT PHYSICAL THERAPY TREATMENT    Patient Name: [name] Pratt MRN: [id number] DOB:09/04/1941, 82 y.o., male Today's Date: 06/06/2023   END OF SESSION:  PT End of Session - 06/06/23 1231     Visit Number 15    Date for PT Re-Evaluation 07/05/23    Authorization Type [name]    Authorization Time Period 05/10/23 - 07/05/23    Authorization - Visit Number 6    Authorization - Number of Visits 12    Progress Note Due on Visit 20    PT Start Time 1151   pt late   PT Stop Time 1230    PT Time Calculation (min) 39 min    Activity Tolerance Patient tolerated treatment well    Behavior During Therapy [facility] for tasks assessed/performed                        Past Medical History:  Diagnosis Date   Allergic rhinitis 02/02/2016   Anemia 12/17/2009   Formatting of this note might be different from the original. 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 Hypertension   Benign paroxysmal positional vertigo 09/14/2021   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HCC) 07/22/2021   Body mass index (BMI) 25.0-25.9, adult 11/02/2020   Cancer (HCC)    skin cancer   Cardiac murmur 07/25/2021   Carotid stenosis    a. Carotid U/S 5/13: LICA < 50%, RICA 50-69%;  b.  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   Cystoid macular edema of both eyes 07/28/2020    Degenerative disc disease, lumbar 06/30/2015   Deviated nasal septum 02/18/2015   Diverticulosis 03/03/2020   DJD (degenerative joint disease) of hip 12/24/2012   Dyspnea 06/12/2011   Emphysema, unspecified (HCC) 03/05/2020   Erectile dysfunction 06/20/2017   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   Gait disorder 03/02/2021   GERD (gastroesophageal reflux disease)    GOUT 05/13/2009   Qualifier: Diagnosis of  By: Kem Parkinson     Greater trochanteric pain syndrome 07/22/2021   Hand pain, left 01/16/2022   Hearing loss 02/02/2016   Heme positive stool 02/29/2020   HIATAL HERNIA 05/13/2009   Qualifier: Diagnosis of  By: Kem Parkinson     High risk medication use 01/18/2012   Hip pain 03/02/2021   History of chicken pox    History of COVID-19 12/05/2021   History of hemorrhoids    History of kidney stones    History of skin cancer 07/22/2021   skin cancer   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 Cardiology - Dr Estrella Myrtle at [facility] cardiology  ICD-10 cut over   Hypertension  under control   Hypokalemia 07/08/2021   Hypothyroidism    Internal hemorrhoids 03/03/2020   Leg cramps 11/13/2019   Lower GI bleed 12/12/2012   Lumbago of lumbar region with sciatica 10/21/2020   Lumbar radiculopathy 03/02/2021   Lumbar spondylosis 11/02/2020   NEPHROLITHIASIS 05/13/2009   Qualifier: Diagnosis of  By: Kem Parkinson     Nonspecific abnormal electrocardiogram (ECG) (EKG) 01/06/2013   Numbness of hand 01/24/2022   Onychomycosis 06/30/2015   Osteoarthrosis, 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 Osteoarthritis Of The Hand  10/1 IMO update   Osteoarthrosis, unspecified whether generalized or localized,  pelvic region and thigh 07/25/2010   Formatting of this note might be different from the original. Osteoarthritis Of The Hip   Other abnormal glucose 07/22/2021   Other allergic rhinitis 02/18/2015   Palpitations 07/25/2021   Peripheral neuropathy 06/19/2016   Preventative health care 11/24/2015   Right groin pain 11/29/2012   Right inguinal hernia 05/13/2009   Qualifier: Diagnosis of  By: Kem Parkinson     RLS (restless legs syndrome) 02/18/2015   Rosacea    Rotator cuff tear 07/27/2013   Situational depression 03/02/2021   Skin lesion 01/16/2022   Sleep apnea with use of continuous positive airway pressure (CPAP) 07/15/2013   CPAP set to  7 cm water,  Residual AHi was 7.8  With no leak.  User time 6 hours.  479 days , not used only 12 days - highly compliant 06-23-13  .    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 Fawn Kirk   Ulnar neuropathy 01/24/2022   Urinary retention 11/26/2020   Ventral hernia 07/08/2012   Vertigo 07/08/2021   Weight loss 03/03/2020   Past Surgical History:  Procedure Laterality Date   ABDOMINAL HERNIA REPAIR  07/15/12   Dr Ashley Jacobs   ANTERIOR CERVICAL DECOMP/DISCECTOMY FUSION N/A 07/09/2020   Procedure: ANTERIOR CERVICAL DECOMPRESSION AND FUSION CERVICAL THREE-FOUR.;  Surgeon: Donalee Citrin, MD;  Location: [facility] OR;  Service: Neurosurgery;  Laterality: N/A;  anterior   BROW LIFT  05/07/01   COLONOSCOPY WITH PROPOFOL N/A 04/24/2022   Procedure: COLONOSCOPY WITH PROPOFOL;  Surgeon: Tressia Danas, MD;  Location: [facility] ENDOSCOPY;  Service: Gastroenterology;  Laterality: N/A;   COLONOSCOPY WITH PROPOFOL N/A 04/26/2022   Procedure: COLONOSCOPY WITH PROPOFOL;  Surgeon: Tressia Danas, MD;  Location: [facility] ENDOSCOPY;  Service: Gastroenterology;  Laterality: N/A;   CYSTOSCOPY WITH URETEROSCOPY AND STENT PLACEMENT Right 01/28/2014   Procedure: CYSTOSCOPY WITH URETEROSCOPY, BASKET RETRIVAL AND  STENT PLACEMENT;  Surgeon: Valetta Fuller, MD;  Location: [facility] ORS;  Service: Urology;  Laterality: Right;   epidural injections     multiple procedures   ESOPHAGEAL DILATION  1993 and 1994   multiple times   EYE SURGERY Bilateral 03/25/10, 2012   cataract removal   HEMORRHOID SURGERY  2014   HIATAL HERNIA REPAIR  12/21/93   HOLMIUM LASER APPLICATION Right 01/28/2014   Procedure: HOLMIUM LASER APPLICATION;  Surgeon: Valetta Fuller, MD;  Location: [facility] ORS;  Service: Urology;  Laterality: Right;   INGUINAL HERNIA REPAIR Right 07/15/12   Dr Ashley Jacobs, x2   JOINT REPLACEMENT  07/25/04   right knee   JOINT REPLACEMENT  07/13/10   left knee   KNEE SURGERY  1995   LAPAROSCOPIC CHOLECYSTECTOMY  09/20/06   with intraoperative cholangiogram and right inguinal herniorrhaphy with mesh    LIGAMENT REPAIR Left 07/2013   shoulder   LITHOTRIPSY     x2   LUMBAR LAMINECTOMY/DECOMPRESSION MICRODISCECTOMY Bilateral 12/27/2022   Procedure: Lumbar Two-Three, Lumbar Three-Four Sublaminar Decompression;  Surgeon: Donalee Citrin, MD;  Location: [facility] OR;  Service: Neurosurgery;  Laterality: Bilateral;   NASAL SEPTUM SURGERY  1975   POLYPECTOMY  04/26/2022   Procedure: POLYPECTOMY;  Surgeon: Tressia Danas, MD;  Location: [facility] ENDOSCOPY;  Service: Gastroenterology;;   POSTERIOR CERVICAL FUSION/FORAMINOTOMY N/A 11/24/2020   Procedure: Posterior Cervical Laminectomy Cervical three-four, Cervical four-five with lateral mass fusion;  Surgeon: Donalee Citrin, MD;  Location: [facility] OR;  Service: Neurosurgery;  Laterality: N/A;   REFRACTIVE SURGERY Bilateral    Right total hip replacement  4/14   SKIN CANCER DESTRUCTION     nose, ear   TONSILLECTOMY  as child   TOTAL HIP ARTHROPLASTY Left    URETHRAL DILATION  1991   Dr.  Annabell Howells   Patient Active Problem List   Diagnosis Date Noted   Spinal stenosis of lumbar region 12/27/2022   Drowsiness 12/07/2022   Preop examination 12/06/2022   Weakness of lower extremity 10/10/2022   Skin tear of right elbow without complication 09/18/2022   Skin tear of forearm without complication, left, subsequent encounter 09/18/2022   Fall 09/18/2022   Iron deficiency anemia 09/18/2022   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   Vitamin D deficiency 08/07/2022   Closed fracture of left hip (HCC) 08/07/2022   Hand weakness 05/02/2022   Acute kidney injury (HCC) 05/02/2022   Adenomatous polyp of ascending colon    Adenomatous polyp of colon    Acute metabolic encephalopathy 04/24/2022   GI bleed 04/21/2022   Vasovagal syncope    Hemorrhagic shock (HCC)    Hematochezia    Irregular heart beat 02/03/2022   Constipation 02/03/2022   Carpal tunnel syndrome 01/24/2022   Numbness of hand 01/24/2022   Ulnar neuropathy 01/24/2022   Skin lesion 01/16/2022   History of COVID-19 12/05/2021   Benign paroxysmal positional vertigo 09/14/2021   Palpitations 07/25/2021   Cardiac murmur 07/25/2021   History of chicken pox 07/22/2021   History of hemorrhoids 07/22/2021   COPD (chronic obstructive pulmonary disease) (HCC) 07/22/2021   History of kidney stones 07/22/2021   History of skin cancer 07/22/2021   Complication of anesthesia 07/22/2021   Bilateral sacroiliitis (HCC) 07/22/2021   Other abnormal glucose 07/22/2021   Spondylitis (HCC) 07/22/2021   Greater trochanteric pain syndrome 07/22/2021   Vertigo 07/08/2021   Hypokalemia 07/08/2021   Trochanteric bursitis of left hip 06/30/2021   Cancer (HCC) 04/11/2021   Lumbar radiculopathy 03/02/2021   Gait instability 03/02/2021   Hip pain 03/02/2021   Situational depression 03/02/2021   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   Urinary retention 11/26/2020    Cervical myelopathy (HCC) 11/24/2020   Body mass index (BMI) 25.0-25.9, adult 11/02/2020   Lumbar spondylosis 11/02/2020   Lumbago of lumbar region with sciatica 10/21/2020   Cystoid macular edema of both eyes 07/28/2020   Status post cervical spinal fusion 07/09/2020   History of total bilateral knee replacement 07/08/2020   Aortic atherosclerosis (HCC) 03/05/2020   Emphysema, unspecified (HCC) 03/05/2020   Arthritis 03/04/2020   Diverticulosis 03/03/2020   Internal hemorrhoids 03/03/2020   Weight loss 03/03/2020  Heme positive stool 02/29/2020   Leg cramps 11/13/2019   Tibialis anterior tendon tear, nontraumatic 11/13/2019   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 07/29/2018   CPAP (continuous positive airway pressure) dependence 03/25/2018   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   Erectile dysfunction 06/20/2017   Stenosis of right carotid artery without cerebral infarction 03/06/2017   Peripheral neuropathy 06/19/2016   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   Hearing loss 02/02/2016   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   Allergic rhinitis 02/02/2016   Atypical chest pain 11/24/2015   Preventative health care 11/24/2015   Onychomycosis 06/30/2015   Degenerative disc disease, lumbar 06/30/2015   Other allergic rhinitis 02/18/2015   Deviated nasal septum 02/18/2015   RLS (restless legs syndrome) 02/18/2015   GERD (gastroesophageal reflux disease) 09/18/2014   Hyperglycemia 03/03/2014   Rotator cuff tear 07/27/2013   Sleep apnea with use of continuous positive airway pressure (CPAP) 07/15/2013   Carotid stenosis 02/24/2013   Nonspecific abnormal electrocardiogram (ECG) (EKG) 01/06/2013   DJD (degenerative joint disease) of hip 12/24/2012   Lower GI bleed 12/12/2012   Right groin pain 11/29/2012   OSA (obstructive sleep apnea) 11/25/2012   Ventral hernia 07/08/2012   High risk medication use 01/18/2012   Dyspnea 06/12/2011   Hyperlipidemia  12/27/2010   Osteoarthrosis, unspecified whether generalized or localized, pelvic region and thigh 07/25/2010   Osteoarthrosis, hand 06/13/2010   Anemia 12/17/2009   Hypothyroidism 05/13/2009   GOUT 05/13/2009   Right inguinal hernia 05/13/2009   HIATAL HERNIA 05/13/2009   NEPHROLITHIASIS 05/13/2009   ROSACEA 05/13/2009   Hypertension 05/13/2009    PCP: [name], NP   REFERRING PROVIDER: Susa Raring., MD   REFERRING DIAG:  M25.562, G89.29 (ICD-10-CM) - Chronic pain in left knee  M25.561, G89.29 (ICD-10-CM) - Chronic pain in right knee   THERAPY DIAG:  Chronic pain of both knees  Muscle weakness (generalized)  Unsteadiness on feet  Other abnormalities of gait and mobility  Repeated falls  Other low back pain  RATIONALE FOR EVALUATION AND TREATMENT: Rehabilitation  ONSET DATE: off and on for a couple years  NEXT MD VISIT: none scheduled   SUBJECTIVE:                                                                                                                                                                                                         SUBJECTIVE STATEMENT: Pt reports he is just tired today, no cramps last night.  PAIN: Are you having pain? No - B knees  Are you  having pain? No - low back   PERTINENT HISTORY:  L2-3, L3-4 Sublaminar Decompression on 12/27/22; Revision of femoral component of L THA on 07/02/22 s/p fall with periprosthetic fracture; Aortic atherosclerosis; carotid stenosis; cervical myelopathy s/p ACDF 06/2020 and posterior cervical fusion 11/2020; peripheral neuropathy; lumbar DDD/spondylosis; lumbago with sciatica/lumbar radiculopathy; OA; B TKA; B THA; L greater trochanteric pain syndrome; L RCR 2014; CTR; hearing loss; HTN; BPPV/vertigo; B sacroiliitis; skin cancer; COPD/emphysema; sleep apnea with CPAP; GERD; gout; HLD; LE cramps; RLS   PRECAUTIONS: Fall  WEIGHT BEARING RESTRICTIONS: No  FALLS:  Has patient fallen in  last 6 months? Yes. 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  No fracture or bony lesion or other findings.  Impression: Right knee replacement in good condition.   01/31/23 - L knee x-ray: No fracture or bony lesion or arthritic change or other abnormality.  Left  knee replacement looks well-fixed and in excellent position.  Impression: Knee replacement in good condition.   PATIENT SURVEYS:  LEFS 31 / 80 = 38.8 % 05/10/23: 32 / 80 = 40.0 %  COGNITION: Overall cognitive status: Within functional limits for tasks assessed and History of cognitive impairments - at baseline    SENSATION: Intermittent peripheral neuropathy in B feet  MUSCLE LENGTH: TBA Hamstrings:  ITB:  Piriformis:  Hip flexors:  Quads:  Heelcord:   POSTURE:  rounded shoulders, forward head, and flexed trunk   LOWER EXTREMITY ROM: Limited B hip and ankle ROM Active ROM Right eval Left eval Right 04/03/23 Left 04/03/23 R/L 05/28/23  Knee flexion 104 96 110 108   Knee extension -10 -5 4 2 4  in LAQ/ 1 in LAQ   Passive ROM Right eval Left eval   Knee flexion    Knee extension 0 0  (Blank rows = not tested)  LOWER EXTREMITY MMT:  MMT Right Eval* Left Eval* R * 05/10/23 L * 05/10/23  Hip flexion 3+ 4 3+ 4-  Hip extension 3- 3+ 3+ 3+  Hip abduction 4- 4 4 4   Hip adduction 4 4 4 4   Hip internal rotation 3+ 4- 3+ 4-  Hip external rotation 2+ 3- 2+ 3-  Knee flexion 4 4 4+ 4+  Knee extension 4 4+ 4 4  Ankle dorsiflexion 4+ 4 4+ 4-  Ankle plantarflexion      Ankle inversion      Ankle eversion       (Blank rows = not tested, *tested in sitting)  FUNCTIONAL TESTS: (Remaining tests to be assessed next visit with PT) 5 times sit to stand: 18.94 sec w/o  UE assist (uncontrolled descent on most reps) (03/22/23) Timed up and go (TUG): 21.29 sec with RW (03/22/23) 10 meter walk test: 21.13 sec with RW; Gait speed = 1.55 ft/sec  Berg Balance Scale: 35/56; <36 = High risk for falls (close to 100%) (03/22/23) Dynamic Gait Index: 11/24; Scores of 19 or less are predictive of falls in older community living adults (03/22/23)  04/17/23: TUG: 20 sec  5xSTS: 18 sec  05/10/23: 5xSTS: 13.31 sec w/o UE assist TUG: 21.06 with RW, difficulty with turning : 14.25 sec with RW Gait speed: 2.30 ft/sec with RW DGI: 13/24  05/17/23: Sharlene Motts: 32/56  GAIT: Distance walked: 60 ft Assistive device utilized: Environmental consultant - 2 wheeled Level of assistance: SBA Gait pattern: step through pattern, decreased stride length, decreased hip/knee flexion- Right, decreased hip/knee flexion- Left, decreased ankle dorsiflexion- Right, decreased ankle dorsiflexion- Left, trunk flexed, poor foot clearance- Right, and poor foot clearance- Left Comments: Cues necessary for improved upright posture and rolling walker proximity   TODAY'S TREATMENT:  06/06/23 THERAPEUTIC EXERCISE: to improve flexibility, strength and mobility.  Demonstration, verbal and tactile cues throughout for technique. Seated trunk twist blue weight ball x 10 Knee flexion 15# BLE 3x10 Knee extension 10#  BLE 3x10  GAIT TRAINING: To normalize gait pattern and improve safety with FWW/RW. Gait 230, 180 ft with RW - cues for heel toe pattern, upright posture 05/30/23 THERAPEUTIC EXERCISE: to improve flexibility, strength and mobility.  Demonstration, verbal and tactile cues throughout for technique. NuStep - L6 x 8 min R/L negative heel gastroc stretch with toes on 2" block/book 3 x 30" Standing heel/toe weight shift/raises 2 x 10, UE support on counter Standing hip and knee flexion march + DF 2 x 10, 2nd set with looped YTB around forefoot; UE support on counter Seated R/L YTB ankle DF 2 x 10 each  NEUROMUSCULAR RE-EDUCATION: To improve balance, proprioception, coordination, reduce fall risk, amplitude of movement, and reduce rigidity. Alt toe clears to 9" step x 20 - emphasis on active DF for toe clearance at edge of step and targeting heel touch on step to keep DF's engaged; UE support on counter and back of chair for balance Alt step-over 1/2 FR and back focusing on upright posture with exaggerated hip and knee flexion and ankle DF targeting heel strike on weight acceptance through flat foot on fwd leg with initiation of toe-off on trailing leg, then initiating step back to starting position with active DF progressing into hip and knee flexion for clearance over 1/2 FR; UE support on counter and back of chair for balance  GAIT TRAINING: To normalize gait pattern and improve safety with FWW/RW . 180 ft with FWW - focusing on upright posture with exaggerated hip and knee flexion and ankle DF targeting heel strike on weight acceptance - cues to avoid excessive stride length to improve RW proximity    05/28/23 THERAPEUTIC EXERCISE: to improve flexibility, strength and mobility.  Demonstration, verbal and tactile cues throughout for technique. NuStep - L6 x 6 min Standing marching x 10 BLE Standing hip abduction x 10 BLE Standing hip extension x 10 BLE Seated pallof press GTB x 15  Seated  horizontal ABD GTB x 10  Standing lateral steps with weight shift x 10 bil   05/23/23 THERAPEUTIC EXERCISE: to improve flexibility, strength and mobility.  Demonstration, verbal and tactile cues throughout for technique. NuStep - L5 x 6 min Sit to stands x 10  Seated LAQ x 5 BLE Standing hip up and over/sidestep  2# x 10 bil Seated marching 2# x 10 Seated rows 15# 2x10 low grips Seated shoulder flexion AAROM with cane x 10  Seated trunk rotation x 10 each direction  GAIT TRAINING: To normalize gait pattern and improve safety with RW.  180' with RW - cues for upright posture and improved RW proximity    PATIENT EDUCATION:  Education details: HEP update - anti-rotation press and horiz ABD and gait safety with RW   Person educated: Patient Education method: Explanation, Demonstration, and Verbal cues Education comprehension: verbalized understanding, verbal cues required, and needs further education  HOME EXERCISE PROGRAM: Access Code: [email or web address] URL: [email or web address] Date: 05/28/2023 Prepared by: Verta Ellen  Exercises - Seated Hamstring Stretch with Strap  - 1 x daily - 7 x weekly - 3 sets - 30 sec hold - Seated Quad Set  - 1 x daily - 7 x weekly - 3 sets - 10 reps - 5 seconds hold - Seated Hip Adduction Squeeze with Ball  - 1 x daily - 7 x weekly - 3 sets - 10 reps - 5 secs hold - Supine Quad Set on Towel Roll  - 1 x daily - 7 x weekly - 2 sets - 10 reps - 3 sec hold - Hooklying Single Leg Bent Knee Fallouts with Resistance  - 1 x daily - 7 x weekly - 2 sets - 10 reps - 3 sec hold - Supine Bridge with Resistance Band  - 1 x daily - 7 x weekly - 2 sets - 10 reps - 5 sec hold - Seated Long Arc Quad  - 1 x daily - 7 x weekly - 3 sets - 10 reps - Standing Gastroc Stretch at Counter  - 1 x daily - 7 x weekly - 3 sets - 3 reps - 30 sec hold - Seated Shoulder Horizontal Abduction with Resistance - Palms Down  - 1 x daily - 7 x weekly - 3 sets - 10 reps - Seated  Anti-Rotation Press With Anchored Resistance  - 1 x daily - 7 x weekly - 3 sets - 10 reps   ASSESSMENT:  CLINICAL IMPRESSION: Jessie Goetschius" continues to require cuing while walking for heel toe pattern and upright posture throughout session. Good response to treatment, with cues needed for form and safety with exercises. Worked on increasing walking distance to improve endurance with ADLs. [name] will benefit from continued skilled PT to address ongoing intermittent pain, strength and balance deficits for improved mobility with decreased pain interference and decreased risk for recurrent falls.  OBJECTIVE IMPAIRMENTS: Abnormal gait, decreased activity tolerance, decreased balance, decreased coordination, decreased endurance, decreased knowledge of condition, decreased knowledge of use of DME, decreased mobility, difficulty walking, decreased ROM, decreased strength, decreased safety awareness, increased fascial restrictions, impaired perceived functional ability, increased muscle spasms, impaired flexibility, impaired sensation, improper body mechanics, postural dysfunction, and pain.   ACTIVITY LIMITATIONS: carrying, lifting, bending, sitting, standing, squatting, sleeping, stairs, transfers, bed mobility, bathing, locomotion level, and caring for others  PARTICIPATION LIMITATIONS: meal prep, cleaning, laundry, driving, shopping, and community activity  PERSONAL FACTORS: Age, Behavior pattern, Fitness, Past/current experiences, Time since onset of injury/illness/exacerbation, and 3+ comorbidities: L2-3, L3-4 Sublaminar Decompression on 12/27/22; Revision of femoral component of L THA on 07/02/22 s/p fall with periprosthetic fracture; Aortic atherosclerosis; carotid stenosis; cervical myelopathy s/p ACDF 06/2020 and posterior cervical fusion 11/2020; peripheral neuropathy; lumbar DDD/spondylosis; lumbago with sciatica/lumbar radiculopathy; OA; B TKA; B THA; L greater trochanteric pain syndrome; L RCR  2014; CTR; hearing  loss; HTN; BPPV/vertigo; B sacroiliitis; skin cancer; COPD/emphysema; sleep apnea with CPAP; GERD; gout; HLD; LE cramps; RLS   are also affecting patient's functional outcome.   REHAB POTENTIAL: Good  CLINICAL DECISION MAKING: Evolving/moderate complexity  EVALUATION COMPLEXITY: Moderate   GOALS: Goals reviewed with patient? Yes  SHORT TERM GOALS: Target date: 04/10/2023, extended to 06/07/2023  Patient will be independent with initial HEP. Baseline:  Goal status: IN PROGRESS  05/10/23 - pt reports difficulty using TB due to inability to get the band over his legs and has been using his hands for manual resistance  2.  Complete balance testing and establish additional STG's/LTG's as appropriate. Baseline:  Goal status: MET  03/22/23  3.  Patient will improve 5x STS time to </= 16 seconds with good eccentric control for improved efficiency and safety with transfers Baseline: 18.94 sec w/o UE assist (uncontrolled descent on most reps) Goal status: MET  05/10/23 - 13.31 sec w/o UE assist with good stability upon standing and improved eccentric control  4.  Patient will demonstrate decreased TUG time to </= 18 sec to decrease risk for falls with transitional mobility Baseline: 21.29 sec with RW Goal status: IN PROGRESS  04/17/23 - 20 sec with RW; 05/10/23 - 21.06 sec with RW  LONG TERM GOALS: Target date: 05/08/2023, extended to 07/05/2023  Patient will be independent with advanced/ongoing HEP to improve outcomes and carryover.  Baseline:  Goal status: IN PROGRESS  05/10/23 - pt reports difficulty using TB due to inability to get the band over his legs and has been using his hands for manual resistance  2.  Patient will report at least 50-75% improvement in B knee pain with no further instances of subluxation to improve QOL. Baseline: 5/10 Goal status: MET  05/10/23 - no recent knee pain reported  3.  Patient will demonstrate improved B knee extension AROM to [facility] to allow  for improved stability for normal gait and stair mechanics. Baseline: Refer to above LE ROM table Goal status: IN PROGRESS  05/28/23 - refer to ROM table  4.  Patient will demonstrate improved B LE strength to >/= 4 to 4+/5 for improved stability and ease of mobility. Baseline: Refer to above LE MMT table Goal status: IN PROGRESS  05/10/23 - overall LE strength essentially unchanged  5.  Patient will be able to ambulate 400' with LRAD and normal gait pattern without increased pain to access community.  Baseline:  Goal status: IN PROGRESS  05/10/23 - 200' with RW and frequent cues for upright posture and improved RW proximity  6. Patient will be able to ascend/descend stairs with 1 HR and reciprocal step pattern safely to access home and community.  Baseline:  Goal status: IN PROGRESS  05/10/23 - able to complete reciprocal ascent with left HR and R HHA/CGA of PT (reports he uses cane for other hand at home), step to pattern on descent with continued need for R HHA/CGA PT  7.  Patient will report >/= 40/80 on LEFS to demonstrate improved functional ability. Baseline: 31 / 80 = 38.8 % Goal status: IN PROGRESS  05/10/23 - 32 / 80 = 40.0 %  8.  Patient will demonstrate at least 19/24 on DGI to decrease risk of falls. Baseline: 11/24 (03/22/23) Goal status: IN PROGRESS  05/10/23 - 13/241  9.  Patient will improve Berg score by at least 8 points to improve safety and stability with ADLs in standing and reduce risk for falls. Baseline: 35/56 (03/22/23) Goal status: IN  PROGRESS  05/17/23 - 32/56   PLAN:  PT FREQUENCY: 2x/week  PT DURATION: 6-8 weeks  PLANNED INTERVENTIONS: Therapeutic exercises, Therapeutic activity, Neuromuscular re-education, Balance training, Gait training, Patient/Family education, Self Care, Joint mobilization, Stair training, DME instructions, Aquatic Therapy, Dry Needling, Electrical stimulation, Spinal mobilization, Cryotherapy, Moist heat, Taping, Ultrasound, and Manual  therapy  PLAN FOR NEXT SESSION: core/lumbopelvic/B LE flexibility and strengthening; balance training; review and update/progress HEP as indicated - look into alternative options for Thera-Band vs other types of resistance as pt reports difficulty donning band around legs; if forward flexed posture with gait persists, consider transition to standard RW   Darleene Cleaver, PTA 06/06/2023, 12:32 PM

## 2023-06-08 ENCOUNTER — Ambulatory Visit: Payer: Medicare HMO

## 2023-06-12 ENCOUNTER — Ambulatory Visit: Payer: Medicare HMO | Attending: Family | Admitting: Physical Therapy

## 2023-06-12 DIAGNOSIS — G8929 Other chronic pain: Secondary | ICD-10-CM | POA: Diagnosis not present

## 2023-06-12 DIAGNOSIS — R2681 Unsteadiness on feet: Secondary | ICD-10-CM | POA: Insufficient documentation

## 2023-06-12 DIAGNOSIS — M6281 Muscle weakness (generalized): Secondary | ICD-10-CM | POA: Insufficient documentation

## 2023-06-12 DIAGNOSIS — R296 Repeated falls: Secondary | ICD-10-CM | POA: Insufficient documentation

## 2023-06-12 DIAGNOSIS — M5459 Other low back pain: Secondary | ICD-10-CM | POA: Insufficient documentation

## 2023-06-12 DIAGNOSIS — M25561 Pain in right knee: Secondary | ICD-10-CM | POA: Insufficient documentation

## 2023-06-12 DIAGNOSIS — R2689 Other abnormalities of gait and mobility: Secondary | ICD-10-CM | POA: Diagnosis not present

## 2023-06-12 DIAGNOSIS — M25562 Pain in left knee: Secondary | ICD-10-CM | POA: Insufficient documentation

## 2023-06-12 NOTE — Therapy (Signed)
OUTPATIENT PHYSICAL THERAPY TREATMENT    Patient Name: [name] Pratt MRN: [id number] DOB:[birth year], 82 y.o., male Today's Date: 06/12/2023   END OF SESSION:  PT End of Session - 06/12/23 1107     Visit Number 16    Date for PT Re-Evaluation 07/05/23    Authorization Type [name]    Authorization Time Period 05/10/23 - 07/05/23    Authorization - Visit Number 7    Authorization - Number of Visits 12    Progress Note Due on Visit 20    PT Start Time 1107    PT Stop Time 1147    PT Time Calculation (min) 40 min    Activity Tolerance Patient tolerated treatment well    Behavior During Therapy [facility] for tasks assessed/performed                         Past Medical History:  Diagnosis Date   Allergic rhinitis 02/02/2016   Anemia 12/17/2009   Formatting of this note might be different from the original. 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 Hypertension   Benign paroxysmal positional vertigo 09/14/2021   Bilateral sacroiliitis (HCC) 07/22/2021   Body mass index (BMI) 25.0-25.9, adult 11/02/2020   Cancer (HCC)    skin cancer   Cardiac murmur 07/25/2021   Carotid stenosis    a. Carotid U/S 5/13: LICA < 50%, RICA 50-69%;  b.  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   Cystoid macular edema of both eyes 07/28/2020    Degenerative disc disease, lumbar 06/30/2015   Deviated nasal septum 02/18/2015   Diverticulosis 03/03/2020   DJD (degenerative joint disease) of hip 12/24/2012   Dyspnea 06/12/2011   Emphysema, unspecified (HCC) 03/05/2020   Erectile dysfunction 06/20/2017   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   Gait disorder 03/02/2021   GERD (gastroesophageal reflux disease)    GOUT 05/13/2009   Qualifier: Diagnosis of  By: Kem Parkinson     Greater trochanteric pain syndrome 07/22/2021   Hand pain, left 01/16/2022   Hearing loss 02/02/2016   Heme positive stool 02/29/2020   HIATAL HERNIA 05/13/2009   Qualifier: Diagnosis of  By: Kem Parkinson     High risk medication use 01/18/2012   Hip pain 03/02/2021   History of chicken pox    History of COVID-19 12/05/2021   History of hemorrhoids    History of kidney stones    History of skin cancer 07/22/2021   skin cancer   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 Cardiology - Dr Estrella Myrtle at [facility] cardiology  ICD-10 cut over   Hypertension    under  control   Hypokalemia 07/08/2021   Hypothyroidism    Internal hemorrhoids 03/03/2020   Leg cramps 11/13/2019   Lower GI bleed 12/12/2012   Lumbago of lumbar region with sciatica 10/21/2020   Lumbar radiculopathy 03/02/2021   Lumbar spondylosis 11/02/2020   NEPHROLITHIASIS 05/13/2009   Qualifier: Diagnosis of  By: Kem Parkinson     Nonspecific abnormal electrocardiogram (ECG) (EKG) 01/06/2013   Numbness of hand 01/24/2022   Onychomycosis 06/30/2015   Osteoarthrosis, 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 Osteoarthritis Of The Hand  10/1 IMO update   Osteoarthrosis, unspecified whether generalized or localized,  pelvic region and thigh 07/25/2010   Formatting of this note might be different from the original. Osteoarthritis Of The Hip   Other abnormal glucose 07/22/2021   Other allergic rhinitis 02/18/2015   Palpitations 07/25/2021   Peripheral neuropathy 06/19/2016   Preventative health care 11/24/2015   Right groin pain 11/29/2012   Right inguinal hernia 05/13/2009   Qualifier: Diagnosis of  By: Kem Parkinson     RLS (restless legs syndrome) 02/18/2015   Rosacea    Rotator cuff tear 07/27/2013   Situational depression 03/02/2021   Skin lesion 01/16/2022   Sleep apnea with use of continuous positive airway pressure (CPAP) 07/15/2013   CPAP set to  7 cm water,  Residual AHi was 7.8  With no leak.  User time 6 hours.  479 days , not used only 12 days - highly compliant 06-23-13  .    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 Fawn Kirk   Ulnar neuropathy 01/24/2022   Urinary retention 11/26/2020   Ventral hernia 07/08/2012   Vertigo 07/08/2021   Weight loss 03/03/2020   Past Surgical History:  Procedure Laterality Date   ABDOMINAL HERNIA REPAIR  07/15/12   Dr Ashley Jacobs   ANTERIOR CERVICAL DECOMP/DISCECTOMY FUSION N/A 07/09/2020   Procedure: ANTERIOR CERVICAL DECOMPRESSION AND FUSION CERVICAL THREE-FOUR.;  Surgeon: Donalee Citrin, MD;  Location: [facility] OR;  Service: Neurosurgery;  Laterality: N/A;  anterior   BROW LIFT  05/07/01   COLONOSCOPY WITH PROPOFOL N/A 04/24/2022   Procedure: COLONOSCOPY WITH PROPOFOL;  Surgeon: Tressia Danas, MD;  Location: [facility] ENDOSCOPY;  Service: Gastroenterology;  Laterality: N/A;   COLONOSCOPY WITH PROPOFOL N/A 04/26/2022   Procedure: COLONOSCOPY WITH PROPOFOL;  Surgeon: Tressia Danas, MD;  Location: [facility] ENDOSCOPY;  Service: Gastroenterology;  Laterality: N/A;   CYSTOSCOPY WITH URETEROSCOPY AND STENT PLACEMENT Right 01/28/2014   Procedure: CYSTOSCOPY WITH URETEROSCOPY, BASKET RETRIVAL AND  STENT PLACEMENT;  Surgeon: Valetta Fuller, MD;  Location: [facility] ORS;  Service: Urology;  Laterality: Right;   epidural injections     multiple procedures   ESOPHAGEAL DILATION  1993 and 1994   multiple times   EYE SURGERY Bilateral 03/25/10, 2012   cataract removal   HEMORRHOID SURGERY  2014   HIATAL HERNIA REPAIR  12/21/93   HOLMIUM LASER APPLICATION Right 01/28/2014   Procedure: HOLMIUM LASER APPLICATION;  Surgeon: Valetta Fuller, MD;  Location: [facility] ORS;  Service: Urology;  Laterality: Right;   INGUINAL HERNIA REPAIR Right 07/15/12   Dr Ashley Jacobs, x2   JOINT REPLACEMENT  07/25/04   right knee   JOINT REPLACEMENT  07/13/10   left knee   KNEE SURGERY  1995   LAPAROSCOPIC CHOLECYSTECTOMY  09/20/06   with intraoperative cholangiogram and right inguinal herniorrhaphy with mesh    LIGAMENT REPAIR Left 07/2013   shoulder   LITHOTRIPSY     x2   LUMBAR LAMINECTOMY/DECOMPRESSION MICRODISCECTOMY Bilateral 12/27/2022   Procedure: Lumbar Two-Three, Lumbar Three-Four Sublaminar Decompression;  Surgeon: Donalee Citrin, MD;  Location: [facility] OR;  Service: Neurosurgery;  Laterality: Bilateral;   NASAL SEPTUM SURGERY  1975   POLYPECTOMY  04/26/2022   Procedure: POLYPECTOMY;  Surgeon: Tressia Danas, MD;  Location: [facility] ENDOSCOPY;  Service: Gastroenterology;;   POSTERIOR CERVICAL FUSION/FORAMINOTOMY N/A 11/24/2020   Procedure: Posterior Cervical Laminectomy Cervical three-four, Cervical four-five with lateral mass fusion;  Surgeon: Donalee Citrin, MD;  Location: [facility] OR;  Service: Neurosurgery;  Laterality: N/A;   REFRACTIVE SURGERY Bilateral    Right total hip replacement  4/14   SKIN CANCER DESTRUCTION     nose, ear   TONSILLECTOMY  as child   TOTAL HIP ARTHROPLASTY Left    URETHRAL DILATION  1991   Dr.  Annabell Howells   Patient Active Problem List   Diagnosis Date Noted   Spinal stenosis of lumbar region 12/27/2022   Drowsiness 12/07/2022   Preop examination 12/06/2022   Weakness of lower extremity 10/10/2022   Skin tear of right elbow without complication 09/18/2022   Skin tear of forearm without complication, left, subsequent encounter 09/18/2022   Fall 09/18/2022   Iron deficiency anemia 09/18/2022   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   Vitamin D deficiency 08/07/2022   Closed fracture of left hip (HCC) 08/07/2022   Hand weakness 05/02/2022   Acute kidney injury (HCC) 05/02/2022   Adenomatous polyp of ascending colon    Adenomatous polyp of colon    Acute metabolic encephalopathy 04/24/2022   GI bleed 04/21/2022   Vasovagal syncope    Hemorrhagic shock (HCC)    Hematochezia    Irregular heart beat 02/03/2022   Constipation 02/03/2022   Carpal tunnel syndrome 01/24/2022   Numbness of hand 01/24/2022   Ulnar neuropathy 01/24/2022   Skin lesion 01/16/2022   History of COVID-19 12/05/2021   Benign paroxysmal positional vertigo 09/14/2021   Palpitations 07/25/2021   Cardiac murmur 07/25/2021   History of chicken pox 07/22/2021   History of hemorrhoids 07/22/2021   COPD (chronic obstructive pulmonary disease) (HCC) 07/22/2021   History of kidney stones 07/22/2021   History of skin cancer 07/22/2021   Complication of anesthesia 07/22/2021   Bilateral sacroiliitis (HCC) 07/22/2021   Other abnormal glucose 07/22/2021   Spondylitis (HCC) 07/22/2021   Greater trochanteric pain syndrome 07/22/2021   Vertigo 07/08/2021   Hypokalemia 07/08/2021   Trochanteric bursitis of left hip 06/30/2021   Cancer (HCC) 04/11/2021   Lumbar radiculopathy 03/02/2021   Gait instability 03/02/2021   Hip pain 03/02/2021   Situational depression 03/02/2021   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   Urinary retention 11/26/2020    Cervical myelopathy (HCC) 11/24/2020   Body mass index (BMI) 25.0-25.9, adult 11/02/2020   Lumbar spondylosis 11/02/2020   Lumbago of lumbar region with sciatica 10/21/2020   Cystoid macular edema of both eyes 07/28/2020   Status post cervical spinal fusion 07/09/2020   History of total bilateral knee replacement 07/08/2020   Aortic atherosclerosis (HCC) 03/05/2020   Emphysema, unspecified (HCC) 03/05/2020   Arthritis 03/04/2020   Diverticulosis 03/03/2020   Internal hemorrhoids 03/03/2020   Weight loss 03/03/2020  Heme positive stool 02/29/2020   Leg cramps 11/13/2019   Tibialis anterior tendon tear, nontraumatic 11/13/2019   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 07/29/2018   CPAP (continuous positive airway pressure) dependence 03/25/2018   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   Erectile dysfunction 06/20/2017   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 03/06/2017   Peripheral neuropathy 06/19/2016   Asymmetric SNHL (sensorineural hearing loss) 02/29/2016   Hearing loss 02/02/2016   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   Allergic rhinitis 02/02/2016   Atypical chest pain 11/24/2015   Preventative health care 11/24/2015   Onychomycosis 06/30/2015   Degenerative disc disease, lumbar 06/30/2015   Other allergic rhinitis 02/18/2015   Deviated nasal septum 02/18/2015   RLS (restless legs syndrome) 02/18/2015   GERD (gastroesophageal reflux disease) 09/18/2014   Hyperglycemia 03/03/2014   Rotator cuff tear 07/27/2013   Sleep apnea with use of continuous positive airway pressure (CPAP) 07/15/2013   Carotid stenosis 02/24/2013   Nonspecific abnormal electrocardiogram (ECG) (EKG) 01/06/2013   DJD (degenerative joint disease) of hip 12/24/2012   Lower GI bleed 12/12/2012   Right groin pain 11/29/2012   OSA (obstructive sleep apnea) 11/25/2012   Ventral hernia 07/08/2012   High risk medication use 01/18/2012   Dyspnea 06/12/2011   Hyperlipidemia  12/27/2010   Osteoarthrosis, unspecified whether generalized or localized, pelvic region and thigh 07/25/2010   Osteoarthrosis, hand 06/13/2010   Anemia 12/17/2009   Hypothyroidism 05/13/2009   GOUT 05/13/2009   Right inguinal hernia 05/13/2009   HIATAL HERNIA 05/13/2009   NEPHROLITHIASIS 05/13/2009   ROSACEA 05/13/2009   Hypertension 05/13/2009    PCP: [name], NP   REFERRING PROVIDER: Susa Raring., MD   REFERRING DIAG:  M25.562, G89.29 (ICD-10-CM) - Chronic pain in left knee  M25.561, G89.29 (ICD-10-CM) - Chronic pain in right knee   THERAPY DIAG:  Chronic pain of both knees  Muscle weakness (generalized)  Unsteadiness on feet  Other abnormalities of gait and mobility  Repeated falls  Other low back pain  RATIONALE FOR EVALUATION AND TREATMENT: Rehabilitation  ONSET DATE: off and on for a couple years  NEXT MD VISIT: none scheduled   SUBJECTIVE:                                                                                                                                                                                                         SUBJECTIVE STATEMENT: Pt reports he just feels weak but no pain. Did note some back pain when he woke up this morning.  PAIN: Are you  having pain? No - B knees  Are you having pain? No - low back   PERTINENT HISTORY:  L2-3, L3-4 Sublaminar Decompression on 12/27/22; Revision of femoral component of L THA on 07/02/22 s/p fall with periprosthetic fracture; Aortic atherosclerosis; carotid stenosis; cervical myelopathy s/p ACDF 06/2020 and posterior cervical fusion 11/2020; peripheral neuropathy; lumbar DDD/spondylosis; lumbago with sciatica/lumbar radiculopathy; OA; B TKA; B THA; L greater trochanteric pain syndrome; L RCR 2014; CTR; hearing loss; HTN; BPPV/vertigo; B sacroiliitis; skin cancer; COPD/emphysema; sleep apnea with CPAP; GERD; gout; HLD; LE cramps; RLS   PRECAUTIONS: Fall  WEIGHT BEARING  RESTRICTIONS: No  FALLS:  Has patient fallen in last 6 months? Yes. 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  No fracture or bony lesion or other findings.  Impression: Right knee replacement in good condition.   01/31/23 - L knee x-ray: No fracture or bony lesion or arthritic change or other abnormality.  Left  knee replacement looks well-fixed and in excellent position.  Impression: Knee replacement in good condition.   PATIENT SURVEYS:  LEFS 31 / 80 = 38.8 % 05/10/23: 32 / 80 = 40.0 %  COGNITION: Overall cognitive status: Within functional limits for tasks assessed and History of cognitive impairments - at baseline    SENSATION: Intermittent peripheral neuropathy in B feet  MUSCLE LENGTH: TBA Hamstrings:  ITB:  Piriformis:  Hip flexors:  Quads:  Heelcord:   POSTURE:  rounded shoulders, forward head, and flexed trunk   LOWER EXTREMITY ROM: Limited B hip and ankle ROM Active ROM Right eval Left eval Right 04/03/23 Left 04/03/23 R/L 05/28/23  Knee flexion 104 96 110 108   Knee extension -10 -5 4 2 4  in LAQ/  1 in LAQ   Passive ROM Right eval Left eval  Knee flexion    Knee extension 0 0  (Blank rows = not tested)  LOWER EXTREMITY MMT:  MMT Right Eval* Left Eval* R * 05/10/23 L * 05/10/23  Hip flexion 3+ 4 3+ 4-  Hip extension 3- 3+ 3+ 3+  Hip abduction 4- 4 4 4   Hip adduction 4 4 4 4   Hip internal rotation 3+ 4- 3+ 4-  Hip external rotation 2+ 3- 2+ 3-  Knee flexion 4 4 4+ 4+  Knee extension 4 4+ 4 4  Ankle dorsiflexion 4+ 4 4+ 4-  Ankle plantarflexion      Ankle inversion      Ankle eversion       (Blank rows = not tested, *tested in sitting)  FUNCTIONAL TESTS: (Remaining tests to be assessed next visit with  PT) 5 times sit to stand: 18.94 sec w/o UE assist (uncontrolled descent on most reps) (03/22/23) Timed up and go (TUG): 21.29 sec with RW (03/22/23) 10 meter walk test: 21.13 sec with RW; Gait speed = 1.55 ft/sec  Berg Balance Scale: 35/56; <36 = High risk for falls (close to 100%) (03/22/23) Dynamic Gait Index: 11/24; Scores of 19 or less are predictive of falls in older community living adults (03/22/23)  04/17/23: TUG: 20 sec  5xSTS: 18 sec  05/10/23: 5xSTS: 13.31 sec w/o UE assist TUG: 21.06 with RW, difficulty with turning : 14.25 sec with RW Gait speed: 2.30 ft/sec with RW DGI: 13/24  05/17/23: Sharlene Motts: 32/56  GAIT: Distance walked: 60 ft Assistive device utilized: Environmental consultant - 2 wheeled Level of assistance: SBA Gait pattern: step through pattern, decreased stride length, decreased hip/knee flexion- Right, decreased hip/knee flexion- Left, decreased ankle dorsiflexion- Right, decreased ankle dorsiflexion- Left, trunk flexed, poor foot clearance- Right, and poor foot clearance- Left Comments: Cues necessary for improved upright posture and rolling walker proximity   TODAY'S TREATMENT:   06/12/23 GAIT TRAINING: To normalize gait pattern and improve safety with FWW vs RW . 270 ft with with pt's collapsible FWW - cues for increased hip and knee flexion with heel strike  on weight acceptance for better foot clearance (pt repeated catching toes) as well as increased upright posture and FWW proximity 180 ft with standard RW (what pt uses at home) - better posture and RW proximity with fewer instances of toes catching 150 ft with with pt's collapsible FWW - cues for carryover of stepping pattern used with stepping over 1/2 FR while maintaining FWW proximity  THERAPEUTIC EXERCISE: to improve flexibility, strength and mobility.  Demonstration, verbal and tactile cues throughout for technique. NuStep - L6 x 6 min Standing runner's gastroc and soleus stretches 2 x 30" each bil Standing heel/toes raises at counter 10 x 3", 2 sets - limited lift on L with toe raises; UE support on counter  NEUROMUSCULAR RE-EDUCATION: To improve balance, proprioception, coordination, reduce fall risk, amplitude of movement, and reduce rigidity. R/L progressing to alternating step-over 1/2 FR and back focusing on upright posture with exaggerated hip and knee flexion and ankle DF targeting heel strike on weight acceptance through flat foot on fwd leg with initiation of toe-off on trailing leg, then initiating step back to starting position with active DF progressing into hip and knee flexion for clearance over 1/2 FR; UE support on counter and back of chair for balance   06/06/23 THERAPEUTIC EXERCISE: to improve flexibility, strength and mobility.  Demonstration, verbal and tactile cues throughout for technique. Seated trunk twist blue weight ball x 10 Knee flexion 15# BLE 3x10 Knee extension 10# BLE 3x10  GAIT TRAINING: To normalize gait pattern and improve safety with FWW/RW. Gait 230, 180 ft with RW - cues for heel toe pattern, upright posture   05/30/23 THERAPEUTIC EXERCISE: to improve flexibility, strength and mobility.  Demonstration, verbal and tactile cues throughout for technique. NuStep - L6 x 8 min R/L negative heel gastroc stretch with toes on 2" block/book 3 x 30" Standing  heel/toe weight shift/raises 2 x 10, UE support on counter Standing hip and knee flexion march + DF 2 x 10, 2nd set with looped YTB around forefoot; UE support on counter Seated R/L YTB ankle DF 2 x 10 each  NEUROMUSCULAR RE-EDUCATION: To improve balance, proprioception, coordination, reduce fall risk, amplitude of movement, and reduce rigidity. Alt toe clears to 9" step x  20 - emphasis on active DF for toe clearance at edge of step and targeting heel touch on step to keep DF's engaged; UE support on counter and back of chair for balance Alt step-over 1/2 FR and back focusing on upright posture with exaggerated hip and knee flexion and ankle DF targeting heel strike on weight acceptance through flat foot on fwd leg with initiation of toe-off on trailing leg, then initiating step back to starting position with active DF progressing into hip and knee flexion for clearance over 1/2 FR; UE support on counter and back of chair for balance  GAIT TRAINING: To normalize gait pattern and improve safety with FWW/RW . 180 ft with FWW - focusing on upright posture with exaggerated hip and knee flexion and ankle DF targeting heel strike on weight acceptance - cues to avoid excessive stride length to improve RW proximity    PATIENT EDUCATION:  Education details: gait safety with RW   Person educated: Patient Education method: Explanation, Demonstration, and Verbal cues Education comprehension: verbalized understanding, verbal cues required, and needs further education  HOME EXERCISE PROGRAM: Access Code: [email or web address] URL: [email or web address] Date: 05/28/2023 Prepared by: Verta Ellen  Exercises - Seated Hamstring Stretch with Strap  - 1 x daily - 7 x weekly - 3 sets - 30 sec hold - Seated Quad Set  - 1 x daily - 7 x weekly - 3 sets - 10 reps - 5 seconds hold - Seated Hip Adduction Squeeze with Ball  - 1 x daily - 7 x weekly - 3 sets - 10 reps - 5 secs hold - Supine Quad Set on Towel Roll   - 1 x daily - 7 x weekly - 2 sets - 10 reps - 3 sec hold - Hooklying Single Leg Bent Knee Fallouts with Resistance  - 1 x daily - 7 x weekly - 2 sets - 10 reps - 3 sec hold - Supine Bridge with Resistance Band  - 1 x daily - 7 x weekly - 2 sets - 10 reps - 5 sec hold - Seated Long Arc Quad  - 1 x daily - 7 x weekly - 3 sets - 10 reps - Standing Gastroc Stretch at Counter  - 1 x daily - 7 x weekly - 3 sets - 3 reps - 30 sec hold - Seated Shoulder Horizontal Abduction with Resistance - Palms Down  - 1 x daily - 7 x weekly - 3 sets - 10 reps - Seated Anti-Rotation Press With Anchored Resistance  - 1 x daily - 7 x weekly - 3 sets - 10 reps   ASSESSMENT:  CLINICAL IMPRESSION: Chucky Vanwagoner" continues demonstrated fwd flexed posture with gait leading to poor FWW proximity and decreased L foot clearance. Better posture and RW proximity when switched from his collapsible FWW to a standard RW but still with more difficulty with L ankle DF/foot clearance. Therapeutic exercises and activities focusing on muscle flexibility in lower legs combined with DF/PF and hip strengthening to promote better foot clearance and decreased risk for falls during gait. [name] will benefit from continued skilled PT to address ongoing intermittent pain, strength and balance deficits for improved mobility with decreased pain interference and decreased risk for recurrent falls.  OBJECTIVE IMPAIRMENTS: Abnormal gait, decreased activity tolerance, decreased balance, decreased coordination, decreased endurance, decreased knowledge of condition, decreased knowledge of use of DME, decreased mobility, difficulty walking, decreased ROM, decreased strength, decreased safety awareness, increased fascial restrictions, impaired perceived functional ability, increased muscle spasms, impaired flexibility,  impaired sensation, improper body mechanics, postural dysfunction, and pain.   ACTIVITY LIMITATIONS: carrying, lifting, bending, sitting,  standing, squatting, sleeping, stairs, transfers, bed mobility, bathing, locomotion level, and caring for others  PARTICIPATION LIMITATIONS: meal prep, cleaning, laundry, driving, shopping, and community activity  PERSONAL FACTORS: Age, Behavior pattern, Fitness, Past/current experiences, Time since onset of injury/illness/exacerbation, and 3+ comorbidities: L2-3, L3-4 Sublaminar Decompression on 12/27/22; Revision of femoral component of L THA on 07/02/22 s/p fall with periprosthetic fracture; Aortic atherosclerosis; carotid stenosis; 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   REHAB POTENTIAL: Good  CLINICAL DECISION MAKING: Evolving/moderate complexity  EVALUATION COMPLEXITY: Moderate   GOALS: Goals reviewed with patient? Yes  SHORT TERM GOALS: Target date: 04/10/2023, extended to 06/07/2023  Patient will be independent with initial HEP. Baseline:  Goal status: IN PROGRESS  05/10/23 - pt reports difficulty using TB due to inability to get the band over his legs and has been using his hands for manual resistance  2.  Complete balance testing and establish additional STG's/LTG's as appropriate. Baseline:  Goal status: MET  03/22/23  3.  Patient will improve 5x STS time to </= 16 seconds with good eccentric control for improved efficiency and safety with transfers Baseline: 18.94 sec w/o UE assist (uncontrolled descent on most reps) Goal status: MET  05/10/23 - 13.31 sec w/o UE assist with good stability upon standing and improved eccentric control  4.  Patient will demonstrate decreased TUG time to </= 18 sec to  decrease risk for falls with transitional mobility Baseline: 21.29 sec with RW; 04/17/23 - 20 sec with RW Goal status: IN PROGRESS   05/10/23 - 21.06 sec with RW  LONG TERM GOALS: Target date: 05/08/2023, extended to 07/05/2023  Patient will be independent with advanced/ongoing HEP to improve outcomes and carryover.  Baseline:  Goal status: IN PROGRESS  05/10/23 - pt reports difficulty using TB due to inability to get the band over his legs and has been using his hands for manual resistance  2.  Patient will report at least 50-75% improvement in B knee pain with no further instances of subluxation to improve QOL. Baseline: 5/10 Goal status: MET  05/10/23 - no recent knee pain reported  3.  Patient will demonstrate improved B knee extension AROM to [facility] to allow for improved stability for normal gait and stair mechanics. Baseline: Refer to above LE ROM table Goal status: IN PROGRESS  05/28/23 - refer to ROM table  4.  Patient will demonstrate improved B LE strength to >/= 4 to 4+/5 for improved stability and ease of mobility. Baseline: Refer to above LE MMT table Goal status: IN PROGRESS  05/10/23 - overall LE strength essentially unchanged  5.  Patient will be able to ambulate 400' with LRAD and normal gait pattern without increased pain to access community.  Baseline:  Goal status: IN PROGRESS  06/12/23 - 200' with RW and frequent cues for upright posture and improved RW proximity  6. Patient will be able to ascend/descend stairs with 1 HR and reciprocal step pattern safely to access home and community.  Baseline:  Goal status: IN PROGRESS  05/10/23 - able to complete reciprocal ascent with left HR and R  HHA/CGA of PT (reports he uses cane for other hand at home), step to pattern on descent with continued need for R HHA/CGA PT  7.  Patient will report >/= 40/80 on LEFS to demonstrate improved functional ability. Baseline: 31 / 80 = 38.8 % Goal status: IN PROGRESS  05/10/23 - 32 / 80 = 40.0  %  8.  Patient will demonstrate at least 19/24 on DGI to decrease risk of falls. Baseline: 11/24 (03/22/23) Goal status: IN PROGRESS  05/10/23 - 13/24  9.  Patient will improve Berg score by at least 8 points to improve safety and stability with ADLs in standing and reduce risk for falls. Baseline: 35/56 (03/22/23) Goal status: IN PROGRESS  05/17/23 - 32/56   PLAN:  PT FREQUENCY: 2x/week  PT DURATION: 6-8 weeks  PLANNED INTERVENTIONS: Therapeutic exercises, Therapeutic activity, Neuromuscular re-education, Balance training, Gait training, Patient/Family education, Self Care, Joint mobilization, Stair training, DME instructions, Aquatic Therapy, Dry Needling, Electrical stimulation, Spinal mobilization, Cryotherapy, Moist heat, Taping, Ultrasound, and Manual therapy  PLAN FOR NEXT SESSION: Reassess STG's #1 and 4 (TUG); reassess LE MMT and 10MWT/gait speed in upcoming visits; core/lumbopelvic/B LE flexibility and strengthening; balance training; review and update/progress HEP as indicated - look into alternative options for Thera-Band vs other types of resistance as pt reports difficulty donning band around legs; if forward flexed posture with gait persists, consider transition to standard RW   Marry Guan, PT 06/12/2023, 11:15 AM

## 2023-06-13 ENCOUNTER — Other Ambulatory Visit: Payer: Self-pay | Admitting: Family

## 2023-06-13 DIAGNOSIS — E039 Hypothyroidism, unspecified: Secondary | ICD-10-CM

## 2023-06-14 ENCOUNTER — Ambulatory Visit: Payer: Medicare HMO

## 2023-06-14 DIAGNOSIS — R296 Repeated falls: Secondary | ICD-10-CM | POA: Diagnosis not present

## 2023-06-14 DIAGNOSIS — R2681 Unsteadiness on feet: Secondary | ICD-10-CM

## 2023-06-14 DIAGNOSIS — R2689 Other abnormalities of gait and mobility: Secondary | ICD-10-CM

## 2023-06-14 DIAGNOSIS — M25562 Pain in left knee: Secondary | ICD-10-CM | POA: Diagnosis not present

## 2023-06-14 DIAGNOSIS — M6281 Muscle weakness (generalized): Secondary | ICD-10-CM | POA: Diagnosis not present

## 2023-06-14 DIAGNOSIS — G8929 Other chronic pain: Secondary | ICD-10-CM

## 2023-06-14 DIAGNOSIS — M5459 Other low back pain: Secondary | ICD-10-CM

## 2023-06-14 DIAGNOSIS — M25561 Pain in right knee: Secondary | ICD-10-CM | POA: Diagnosis not present

## 2023-06-14 NOTE — Therapy (Signed)
OUTPATIENT PHYSICAL THERAPY TREATMENT    Patient Name: [name] Pratt MRN: [id number] DOB:12/11/1940, 82 y.o., male Today's Date: 06/14/2023   END OF SESSION:  PT End of Session - 06/14/23 1617     Visit Number 17    Date for PT Re-Evaluation 07/05/23    Authorization Type [name]    Authorization Time Period 05/10/23 - 07/05/23    Authorization - Visit Number 8    Authorization - Number of Visits 12    Progress Note Due on Visit 20    PT Start Time 1532    PT Stop Time 1613    PT Time Calculation (min) 41 min    Activity Tolerance Patient tolerated treatment well    Behavior During Therapy [facility] for tasks assessed/performed                          Past Medical History:  Diagnosis Date   Allergic rhinitis 02/02/2016   Anemia 12/17/2009   Formatting of this note might be different from the original. 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 Hypertension   Benign paroxysmal positional vertigo 09/14/2021   Bilateral sacroiliitis (HCC) 07/22/2021   Body mass index (BMI) 25.0-25.9, adult 11/02/2020   Cancer (HCC)    skin cancer   Cardiac murmur 07/25/2021   Carotid stenosis    a. Carotid U/S 5/13: LICA < 50%, RICA 50-69%;  b.  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   Cystoid macular edema of both eyes 07/28/2020    Degenerative disc disease, lumbar 06/30/2015   Deviated nasal septum 02/18/2015   Diverticulosis 03/03/2020   DJD (degenerative joint disease) of hip 12/24/2012   Dyspnea 06/12/2011   Emphysema, unspecified (HCC) 03/05/2020   Erectile dysfunction 06/20/2017   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   Gait disorder 03/02/2021   GERD (gastroesophageal reflux disease)    GOUT 05/13/2009   Qualifier: Diagnosis of  By: Kem Parkinson     Greater trochanteric pain syndrome 07/22/2021   Hand pain, left 01/16/2022   Hearing loss 02/02/2016   Heme positive stool 02/29/2020   HIATAL HERNIA 05/13/2009   Qualifier: Diagnosis of  By: Kem Parkinson     High risk medication use 01/18/2012   Hip pain 03/02/2021   History of chicken pox    History of COVID-19 12/05/2021   History of hemorrhoids    History of kidney stones    History of skin cancer 07/22/2021   skin cancer   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 Cardiology - Dr Estrella Myrtle at [facility] cardiology  ICD-10 cut over   Hypertension  under control   Hypokalemia 07/08/2021   Hypothyroidism    Internal hemorrhoids 03/03/2020   Leg cramps 11/13/2019   Lower GI bleed 12/12/2012   Lumbago of lumbar region with sciatica 10/21/2020   Lumbar radiculopathy 03/02/2021   Lumbar spondylosis 11/02/2020   NEPHROLITHIASIS 05/13/2009   Qualifier: Diagnosis of  By: Kem Parkinson     Nonspecific abnormal electrocardiogram (ECG) (EKG) 01/06/2013   Numbness of hand 01/24/2022   Onychomycosis 06/30/2015   Osteoarthrosis, 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 Osteoarthritis Of The Hand  10/1 IMO update   Osteoarthrosis, unspecified whether generalized or localized,  pelvic region and thigh 07/25/2010   Formatting of this note might be different from the original. Osteoarthritis Of The Hip   Other abnormal glucose 07/22/2021   Other allergic rhinitis 02/18/2015   Palpitations 07/25/2021   Peripheral neuropathy 06/19/2016   Preventative health care 11/24/2015   Right groin pain 11/29/2012   Right inguinal hernia 05/13/2009   Qualifier: Diagnosis of  By: Kem Parkinson     RLS (restless legs syndrome) 02/18/2015   Rosacea    Rotator cuff tear 07/27/2013   Situational depression 03/02/2021   Skin lesion 01/16/2022   Sleep apnea with use of continuous positive airway pressure (CPAP) 07/15/2013   CPAP set to  7 cm water,  Residual AHi was 7.8  With no leak.  User time 6 hours.  479 days , not used only 12 days - highly compliant 06-23-13  .    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 Fawn Kirk   Ulnar neuropathy 01/24/2022   Urinary retention 11/26/2020   Ventral hernia 07/08/2012   Vertigo 07/08/2021   Weight loss 03/03/2020   Past Surgical History:  Procedure Laterality Date   ABDOMINAL HERNIA REPAIR  07/15/12   Dr Ashley Jacobs   ANTERIOR CERVICAL DECOMP/DISCECTOMY FUSION N/A 07/09/2020   Procedure: ANTERIOR CERVICAL DECOMPRESSION AND FUSION CERVICAL THREE-FOUR.;  Surgeon: Donalee Citrin, MD;  Location: [facility] OR;  Service: Neurosurgery;  Laterality: N/A;  anterior   BROW LIFT  05/07/01   COLONOSCOPY WITH PROPOFOL N/A 04/24/2022   Procedure: COLONOSCOPY WITH PROPOFOL;  Surgeon: Tressia Danas, MD;  Location: [facility] ENDOSCOPY;  Service: Gastroenterology;  Laterality: N/A;   COLONOSCOPY WITH PROPOFOL N/A 04/26/2022   Procedure: COLONOSCOPY WITH PROPOFOL;  Surgeon: Tressia Danas, MD;  Location: [facility] ENDOSCOPY;  Service: Gastroenterology;  Laterality: N/A;   CYSTOSCOPY WITH URETEROSCOPY AND STENT PLACEMENT Right 01/28/2014   Procedure: CYSTOSCOPY WITH URETEROSCOPY, BASKET RETRIVAL AND  STENT PLACEMENT;  Surgeon: Valetta Fuller, MD;  Location: [facility] ORS;  Service: Urology;  Laterality: Right;   epidural injections     multiple procedures   ESOPHAGEAL DILATION  1993 and 1994   multiple times   EYE SURGERY Bilateral 03/25/10, 2012   cataract removal   HEMORRHOID SURGERY  2014   HIATAL HERNIA REPAIR  12/21/93   HOLMIUM LASER APPLICATION Right 01/28/2014   Procedure: HOLMIUM LASER APPLICATION;  Surgeon: Valetta Fuller, MD;  Location: [facility] ORS;  Service: Urology;  Laterality: Right;   INGUINAL HERNIA REPAIR Right 07/15/12   Dr Ashley Jacobs, x2   JOINT REPLACEMENT  07/25/04   right knee   JOINT REPLACEMENT  07/13/10   left knee   KNEE SURGERY  1995   LAPAROSCOPIC CHOLECYSTECTOMY  09/20/06   with intraoperative cholangiogram and right inguinal herniorrhaphy with mesh    LIGAMENT REPAIR Left 07/2013   shoulder   LITHOTRIPSY     x2   LUMBAR LAMINECTOMY/DECOMPRESSION MICRODISCECTOMY Bilateral 12/27/2022   Procedure: Lumbar Two-Three, Lumbar Three-Four Sublaminar Decompression;  Surgeon: Donalee Citrin, MD;  Location: [facility] OR;  Service: Neurosurgery;  Laterality: Bilateral;   NASAL SEPTUM SURGERY  1975   POLYPECTOMY  04/26/2022   Procedure: POLYPECTOMY;  Surgeon: Tressia Danas, MD;  Location: [facility] ENDOSCOPY;  Service: Gastroenterology;;   POSTERIOR CERVICAL FUSION/FORAMINOTOMY N/A 11/24/2020   Procedure: Posterior Cervical Laminectomy Cervical three-four, Cervical four-five with lateral mass fusion;  Surgeon: Donalee Citrin, MD;  Location: [facility] OR;  Service: Neurosurgery;  Laterality: N/A;   REFRACTIVE SURGERY Bilateral    Right total hip replacement  4/14   SKIN CANCER DESTRUCTION     nose, ear   TONSILLECTOMY  as child   TOTAL HIP ARTHROPLASTY Left    URETHRAL DILATION  1991   Dr.  Annabell Howells   Patient Active Problem List   Diagnosis Date Noted   Spinal stenosis of lumbar region 12/27/2022   Drowsiness 12/07/2022   Preop examination 12/06/2022   Weakness of lower extremity 10/10/2022   Skin tear of right elbow without complication 09/18/2022   Skin tear of forearm without complication, left, subsequent encounter 09/18/2022   Fall 09/18/2022   Iron deficiency anemia 09/18/2022   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   Vitamin D deficiency 08/07/2022   Closed fracture of left hip (HCC) 08/07/2022   Hand weakness 05/02/2022   Acute kidney injury (HCC) 05/02/2022   Adenomatous polyp of ascending colon    Adenomatous polyp of colon    Acute metabolic encephalopathy 04/24/2022   GI bleed 04/21/2022   Vasovagal syncope    Hemorrhagic shock (HCC)    Hematochezia    Irregular heart beat 02/03/2022   Constipation 02/03/2022   Carpal tunnel syndrome 01/24/2022   Numbness of hand 01/24/2022   Ulnar neuropathy 01/24/2022   Skin lesion 01/16/2022   History of COVID-19 12/05/2021   Benign paroxysmal positional vertigo 09/14/2021   Palpitations 07/25/2021   Cardiac murmur 07/25/2021   History of chicken pox 07/22/2021   History of hemorrhoids 07/22/2021   COPD (chronic obstructive pulmonary disease) (HCC) 07/22/2021   History of kidney stones 07/22/2021   History of skin cancer 07/22/2021   Complication of anesthesia 07/22/2021   Bilateral sacroiliitis (HCC) 07/22/2021   Other abnormal glucose 07/22/2021   Spondylitis (HCC) 07/22/2021   Greater trochanteric pain syndrome 07/22/2021   Vertigo 07/08/2021   Hypokalemia 07/08/2021   Trochanteric bursitis of left hip 06/30/2021   Cancer (HCC) 04/11/2021   Lumbar radiculopathy 03/02/2021   Gait instability 03/02/2021   Hip pain 03/02/2021   Situational depression 03/02/2021   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   Urinary retention 11/26/2020    Cervical myelopathy (HCC) 11/24/2020   Body mass index (BMI) 25.0-25.9, adult 11/02/2020   Lumbar spondylosis 11/02/2020   Lumbago of lumbar region with sciatica 10/21/2020   Cystoid macular edema of both eyes 07/28/2020   Status post cervical spinal fusion 07/09/2020   History of total bilateral knee replacement 07/08/2020   Aortic atherosclerosis (HCC) 03/05/2020   Emphysema, unspecified (HCC) 03/05/2020   Arthritis 03/04/2020   Diverticulosis 03/03/2020   Internal hemorrhoids 03/03/2020   Weight loss 03/03/2020  Heme positive stool 02/29/2020   Leg cramps 11/13/2019   Tibialis anterior tendon tear, nontraumatic 11/13/2019   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 07/29/2018   CPAP (continuous positive airway pressure) dependence 03/25/2018   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   Erectile dysfunction 06/20/2017   Stenosis of right carotid artery without cerebral infarction 03/06/2017   Peripheral neuropathy 06/19/2016   Asymmetric SNHL (sensorineural hearing loss) 02/29/2016   Hearing loss 02/02/2016   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   Allergic rhinitis 02/02/2016   Atypical chest pain 11/24/2015   Preventative health care 11/24/2015   Onychomycosis 06/30/2015   Degenerative disc disease, lumbar 06/30/2015   Other allergic rhinitis 02/18/2015   Deviated nasal septum 02/18/2015   RLS (restless legs syndrome) 02/18/2015   GERD (gastroesophageal reflux disease) 09/18/2014   Hyperglycemia 03/03/2014   Rotator cuff tear 07/27/2013   Sleep apnea with use of continuous positive airway pressure (CPAP) 07/15/2013   Carotid stenosis 02/24/2013   Nonspecific abnormal electrocardiogram (ECG) (EKG) 01/06/2013   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   Lower GI bleed 12/12/2012   Right groin pain 11/29/2012   OSA (obstructive sleep apnea) 11/25/2012   Ventral hernia 07/08/2012   High risk medication use 01/18/2012   Dyspnea 06/12/2011   Hyperlipidemia 12/27/2010    Osteoarthrosis, unspecified whether generalized or localized, pelvic region and thigh 07/25/2010   Osteoarthrosis, hand 06/13/2010   Anemia 12/17/2009   Hypothyroidism 05/13/2009   GOUT 05/13/2009   Right inguinal hernia 05/13/2009   HIATAL HERNIA 05/13/2009   NEPHROLITHIASIS 05/13/2009   ROSACEA 05/13/2009   Hypertension 05/13/2009    PCP: [name], NP   REFERRING PROVIDER: Susa Raring., MD   REFERRING DIAG:  M25.562, G89.29 (ICD-10-CM) - Chronic pain in left knee  M25.561, G89.29 (ICD-10-CM) - Chronic pain in right knee   THERAPY DIAG:  Chronic pain of both knees  Muscle weakness (generalized)  Unsteadiness on feet  Other abnormalities of gait and mobility  Repeated falls  Other low back pain  RATIONALE FOR EVALUATION AND TREATMENT: Rehabilitation  ONSET DATE: off and on for a couple years  NEXT MD VISIT: none scheduled   SUBJECTIVE:                                                                                                                                                                                                         SUBJECTIVE STATEMENT: Pt reports he has been walking a lot today, went to the bank sat for a few hours.  PAIN: Are you having pain? No - B  knees  Are you having pain? No - low back   PERTINENT HISTORY:  L2-3, L3-4 Sublaminar Decompression on 12/27/22; Revision of femoral component of L THA on 07/02/22 s/p fall with periprosthetic fracture; Aortic atherosclerosis; carotid stenosis; cervical myelopathy s/p ACDF 06/2020 and posterior cervical fusion 11/2020; peripheral neuropathy; lumbar DDD/spondylosis; lumbago with sciatica/lumbar radiculopathy; OA; B TKA; B THA; L greater trochanteric pain syndrome; L RCR 2014; CTR; hearing loss; HTN; BPPV/vertigo; B sacroiliitis; skin cancer; COPD/emphysema; sleep apnea with CPAP; GERD; gout; HLD; LE cramps; RLS   PRECAUTIONS: Fall  WEIGHT BEARING RESTRICTIONS: No  FALLS:  Has  patient fallen in last 6 months? Yes. 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  No fracture or bony lesion or other findings.  Impression: Right knee replacement in good condition.   01/31/23 - L knee x-ray: No fracture or bony lesion or arthritic change or other abnormality.  Left  knee replacement looks well-fixed and in excellent position.  Impression: Knee replacement in good condition.   PATIENT SURVEYS:  LEFS 31 / 80 = 38.8 % 05/10/23: 32 / 80 = 40.0 %  COGNITION: Overall cognitive status: Within functional limits for tasks assessed and History of cognitive impairments - at baseline    SENSATION: Intermittent peripheral neuropathy in B feet  MUSCLE LENGTH: TBA Hamstrings:  ITB:  Piriformis:  Hip flexors:  Quads:  Heelcord:   POSTURE:  rounded shoulders, forward head, and flexed trunk   LOWER EXTREMITY ROM: Limited B hip and ankle ROM Active ROM Right eval Left eval Right 04/03/23 Left 04/03/23 R/L 05/28/23  Knee flexion 104 96 110 108   Knee extension -10 -5 4 2 4  in LAQ/ 1 in LAQ   Passive ROM Right  eval Left eval  Knee flexion    Knee extension 0 0  (Blank rows = not tested)  LOWER EXTREMITY MMT:  MMT Right Eval* Left Eval* R * 05/10/23 L * 05/10/23  Hip flexion 3+ 4 3+ 4-  Hip extension 3- 3+ 3+ 3+  Hip abduction 4- 4 4 4   Hip adduction 4 4 4 4   Hip internal rotation 3+ 4- 3+ 4-  Hip external rotation 2+ 3- 2+ 3-  Knee flexion 4 4 4+ 4+  Knee extension 4 4+ 4 4  Ankle dorsiflexion 4+ 4 4+ 4-  Ankle plantarflexion      Ankle inversion      Ankle eversion       (Blank rows = not tested, *tested in sitting)  FUNCTIONAL TESTS: (Remaining tests to be assessed next visit with PT) 5 times sit to  stand: 18.94 sec w/o UE assist (uncontrolled descent on most reps) (03/22/23) Timed up and go (TUG): 21.29 sec with RW (03/22/23) 10 meter walk test: 21.13 sec with RW; Gait speed = 1.55 ft/sec  Berg Balance Scale: 35/56; <36 = High risk for falls (close to 100%) (03/22/23) Dynamic Gait Index: 11/24; Scores of 19 or less are predictive of falls in older community living adults (03/22/23)  04/17/23: TUG: 20 sec  5xSTS: 18 sec  05/10/23: 5xSTS: 13.31 sec w/o UE assist TUG: 21.06 with RW, difficulty with turning : 14.25 sec with RW Gait speed: 2.30 ft/sec with RW DGI: 13/24  05/17/23: Sharlene Motts: 32/56  GAIT: Distance walked: 60 ft Assistive device utilized: Environmental consultant - 2 wheeled Level of assistance: SBA Gait pattern: step through pattern, decreased stride length, decreased hip/knee flexion- Right, decreased hip/knee flexion- Left, decreased ankle dorsiflexion- Right, decreased ankle dorsiflexion- Left, trunk flexed, poor foot clearance- Right, and poor foot clearance- Left Comments: Cues necessary for improved upright posture and rolling walker proximity   TODAY'S TREATMENT:  06/14/23 THERAPEUTIC EXERCISE: to improve flexibility, strength and mobility.  Demonstration, verbal and tactile cues throughout for technique. NuStep - L6 x 6 min Seated on balance disk: Rows GTB x 10  Shld ext  GTB x 10  TUG test: 17.21 sec  Knee flexion 20# 3x10 BLE Knee extension 15# 2x10 BLE Step ups 4' x 10 BLE Seated pallof press GTB x 10 bil  06/12/23 GAIT TRAINING: To normalize gait pattern and improve safety with FWW vs RW . 270 ft with with pt's collapsible FWW - cues for increased hip and knee flexion with heel strike on weight acceptance for better foot clearance (pt repeated catching toes) as well as increased upright posture and FWW proximity 180 ft with standard RW (what pt uses at home) - better posture and RW proximity with fewer instances of toes catching 150 ft with with pt's collapsible FWW - cues for carryover of stepping pattern used with stepping over 1/2 FR while maintaining FWW proximity  THERAPEUTIC EXERCISE: to improve flexibility, strength and mobility.  Demonstration, verbal and tactile cues throughout for technique. NuStep - L6 x 6 min Standing runner's gastroc and soleus stretches 2 x 30" each bil Standing heel/toes raises at counter 10 x 3", 2 sets - limited lift on L with toe raises; UE support on counter  NEUROMUSCULAR RE-EDUCATION: To improve balance, proprioception, coordination, reduce fall risk, amplitude of movement, and reduce rigidity. R/L progressing to alternating step-over 1/2 FR and back focusing on upright posture with exaggerated hip and knee flexion and ankle DF targeting heel strike on weight acceptance through flat foot on fwd leg with initiation of toe-off on trailing leg, then initiating step back to starting position with active DF progressing into hip and knee flexion for clearance over 1/2 FR; UE support on counter and back of chair for balance   06/06/23 THERAPEUTIC EXERCISE: to improve flexibility, strength and mobility.  Demonstration, verbal and tactile cues throughout for technique. Seated trunk twist blue weight ball x 10 Knee flexion 15# BLE 3x10 Knee extension 10# BLE 3x10  GAIT TRAINING: To normalize gait pattern and improve safety  with FWW/RW. Gait 230, 180 ft with RW - cues for heel toe pattern, upright posture   05/30/23 THERAPEUTIC EXERCISE: to improve flexibility, strength and mobility.  Demonstration, verbal and tactile cues throughout for technique. NuStep - L6 x 8 min R/L negative heel gastroc stretch with toes on 2" block/book 3 x 30" Standing heel/toe weight shift/raises  2 x 10, UE support on counter Standing hip and knee flexion march + DF 2 x 10, 2nd set with looped YTB around forefoot; UE support on counter Seated R/L YTB ankle DF 2 x 10 each  NEUROMUSCULAR RE-EDUCATION: To improve balance, proprioception, coordination, reduce fall risk, amplitude of movement, and reduce rigidity. Alt toe clears to 9" step x 20 - emphasis on active DF for toe clearance at edge of step and targeting heel touch on step to keep DF's engaged; UE support on counter and back of chair for balance Alt step-over 1/2 FR and back focusing on upright posture with exaggerated hip and knee flexion and ankle DF targeting heel strike on weight acceptance through flat foot on fwd leg with initiation of toe-off on trailing leg, then initiating step back to starting position with active DF progressing into hip and knee flexion for clearance over 1/2 FR; UE support on counter and back of chair for balance  GAIT TRAINING: To normalize gait pattern and improve safety with FWW/RW . 180 ft with FWW - focusing on upright posture with exaggerated hip and knee flexion and ankle DF targeting heel strike on weight acceptance - cues to avoid excessive stride length to improve RW proximity    PATIENT EDUCATION:  Education details: gait safety with RW   Person educated: Patient Education method: Explanation, Demonstration, and Verbal cues Education comprehension: verbalized understanding, verbal cues required, and needs further education  HOME EXERCISE PROGRAM: Access Code: [email or web address] URL: [email or web address] Date: 05/28/2023 Prepared  by: Verta Ellen  Exercises - Seated Hamstring Stretch with Strap  - 1 x daily - 7 x weekly - 3 sets - 30 sec hold - Seated Quad Set  - 1 x daily - 7 x weekly - 3 sets - 10 reps - 5 seconds hold - Seated Hip Adduction Squeeze with Ball  - 1 x daily - 7 x weekly - 3 sets - 10 reps - 5 secs hold - Supine Quad Set on Towel Roll  - 1 x daily - 7 x weekly - 2 sets - 10 reps - 3 sec hold - Hooklying Single Leg Bent Knee Fallouts with Resistance  - 1 x daily - 7 x weekly - 2 sets - 10 reps - 3 sec hold - Supine Bridge with Resistance Band  - 1 x daily - 7 x weekly - 2 sets - 10 reps - 5 sec hold - Seated Long Arc Quad  - 1 x daily - 7 x weekly - 3 sets - 10 reps - Standing Gastroc Stretch at Counter  - 1 x daily - 7 x weekly - 3 sets - 3 reps - 30 sec hold - Seated Shoulder Horizontal Abduction with Resistance - Palms Down  - 1 x daily - 7 x weekly - 3 sets - 10 reps - Seated Anti-Rotation Press With Anchored Resistance  - 1 x daily - 7 x weekly - 3 sets - 10 reps   ASSESSMENT:  CLINICAL IMPRESSION: Rashee Gladbach" has met STG for TUG test improving score by 4 points. Continued progressing exercises to tolerance. He was challenged with stabilizing his core seated on balance disk, leaning to R side. Able to add step ups for functional strengthening. [name] will benefit from continued skilled PT to address ongoing intermittent pain, strength and balance deficits for improved mobility with decreased pain interference and decreased risk for recurrent falls.  OBJECTIVE IMPAIRMENTS: Abnormal gait, decreased activity tolerance, decreased balance, decreased coordination, decreased endurance, decreased knowledge of  condition, decreased knowledge of use of DME, decreased mobility, difficulty walking, decreased ROM, decreased strength, decreased safety awareness, increased fascial restrictions, impaired perceived functional ability, increased muscle spasms, impaired flexibility, impaired sensation, improper body  mechanics, postural dysfunction, and pain.   ACTIVITY LIMITATIONS: carrying, lifting, bending, sitting, standing, squatting, sleeping, stairs, transfers, bed mobility, bathing, locomotion level, and caring for others  PARTICIPATION LIMITATIONS: meal prep, cleaning, laundry, driving, shopping, and community activity  PERSONAL FACTORS: Age, Behavior pattern, Fitness, Past/current experiences, Time since onset of injury/illness/exacerbation, and 3+ comorbidities: L2-3, L3-4 Sublaminar Decompression on 12/27/22; Revision of femoral component of L THA on 07/02/22 s/p fall with periprosthetic fracture; Aortic atherosclerosis; carotid stenosis; 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   REHAB POTENTIAL: Good  CLINICAL DECISION MAKING: Evolving/moderate complexity  EVALUATION COMPLEXITY: Moderate   GOALS: Goals reviewed with patient? Yes  SHORT TERM GOALS: Target date: 04/10/2023, extended to 06/07/2023  Patient will be independent with initial HEP. Baseline:  Goal status: IN PROGRESS  05/10/23 - pt reports difficulty using TB due to inability to get the band over his legs and has been using his hands for manual resistance  2.  Complete balance testing and establish additional STG's/LTG's as appropriate. Baseline:  Goal status: MET  03/22/23  3.  Patient will improve 5x STS time to </= 16 seconds with good eccentric control for improved efficiency and safety with transfers Baseline: 18.94 sec w/o UE assist (uncontrolled descent on most reps) Goal status: MET  05/10/23 - 13.31 sec w/o UE assist with good stability upon  standing and improved eccentric control  4.  Patient will demonstrate decreased TUG time to </= 18 sec to decrease risk for falls with transitional mobility Baseline: 21.29 sec with RW; 04/17/23 - 20 sec with RW Goal status: MET   06/14/23- 17.21 sec  LONG TERM GOALS: Target date: 05/08/2023, extended to 07/05/2023  Patient will be independent with advanced/ongoing HEP to improve outcomes and carryover.  Baseline:  Goal status: IN PROGRESS  05/10/23 - pt reports difficulty using TB due to inability to get the band over his legs and has been using his hands for manual resistance  2.  Patient will report at least 50-75% improvement in B knee pain with no further instances of subluxation to improve QOL. Baseline: 5/10 Goal status: MET  05/10/23 - no recent knee pain reported  3.  Patient will demonstrate improved B knee extension AROM to [facility] to allow for improved stability for normal gait and stair mechanics. Baseline: Refer to above LE ROM table Goal status: IN PROGRESS  05/28/23 - refer to ROM table  4.  Patient will demonstrate improved B LE strength to >/= 4 to 4+/5 for improved stability and ease of mobility. Baseline: Refer to above LE MMT table Goal status: IN PROGRESS  05/10/23 - overall LE strength essentially unchanged  5.  Patient will be able to ambulate 400' with LRAD and normal gait pattern without increased pain to access community.  Baseline:  Goal status: IN PROGRESS  06/12/23 - 200' with RW and frequent cues for upright posture and improved RW proximity  6. Patient will be able to ascend/descend stairs with 1 HR and reciprocal step pattern  safely to access home and community.  Baseline:  Goal status: IN PROGRESS  05/10/23 - able to complete reciprocal ascent with left HR and R HHA/CGA of PT (reports he uses cane for other hand at home), step to pattern on descent with continued need for R HHA/CGA PT  7.  Patient will report >/= 40/80 on LEFS to demonstrate improved functional  ability. Baseline: 31 / 80 = 38.8 % Goal status: IN PROGRESS  05/10/23 - 32 / 80 = 40.0 %  8.  Patient will demonstrate at least 19/24 on DGI to decrease risk of falls. Baseline: 11/24 (03/22/23) Goal status: IN PROGRESS  05/10/23 - 13/24  9.  Patient will improve Berg score by at least 8 points to improve safety and stability with ADLs in standing and reduce risk for falls. Baseline: 35/56 (03/22/23) Goal status: IN PROGRESS  05/17/23 - 32/56   PLAN:  PT FREQUENCY: 2x/week  PT DURATION: 6-8 weeks  PLANNED INTERVENTIONS: Therapeutic exercises, Therapeutic activity, Neuromuscular re-education, Balance training, Gait training, Patient/Family education, Self Care, Joint mobilization, Stair training, DME instructions, Aquatic Therapy, Dry Needling, Electrical stimulation, Spinal mobilization, Cryotherapy, Moist heat, Taping, Ultrasound, and Manual therapy  PLAN FOR NEXT SESSION: Reassess STG's #1 and 4 (TUG); reassess LE MMT and 10MWT/gait speed in upcoming visits; core/lumbopelvic/B LE flexibility and strengthening; balance training; review and update/progress HEP as indicated - look into alternative options for Thera-Band vs other types of resistance as pt reports difficulty donning band around legs; if forward flexed posture with gait persists, consider transition to standard RW   Darleene Cleaver, PTA 06/14/2023, 4:22 PM

## 2023-06-19 ENCOUNTER — Ambulatory Visit: Payer: Medicare HMO

## 2023-06-19 DIAGNOSIS — R2689 Other abnormalities of gait and mobility: Secondary | ICD-10-CM | POA: Diagnosis not present

## 2023-06-19 DIAGNOSIS — G8929 Other chronic pain: Secondary | ICD-10-CM | POA: Diagnosis not present

## 2023-06-19 DIAGNOSIS — M25562 Pain in left knee: Secondary | ICD-10-CM | POA: Diagnosis not present

## 2023-06-19 DIAGNOSIS — R2681 Unsteadiness on feet: Secondary | ICD-10-CM | POA: Diagnosis not present

## 2023-06-19 DIAGNOSIS — R296 Repeated falls: Secondary | ICD-10-CM | POA: Diagnosis not present

## 2023-06-19 DIAGNOSIS — M6281 Muscle weakness (generalized): Secondary | ICD-10-CM | POA: Diagnosis not present

## 2023-06-19 DIAGNOSIS — M25561 Pain in right knee: Secondary | ICD-10-CM | POA: Diagnosis not present

## 2023-06-19 DIAGNOSIS — M5459 Other low back pain: Secondary | ICD-10-CM

## 2023-06-19 NOTE — Therapy (Signed)
OUTPATIENT PHYSICAL THERAPY TREATMENT    Patient Name: [name] Pratt MRN: [id number] DOB:06/26/1941, 81 y.o., male Today's Date: 06/19/2023   END OF SESSION:  PT End of Session - 06/19/23 1422     Visit Number 18    Date for PT Re-Evaluation 07/05/23    Authorization Type [name]    Authorization Time Period 05/10/23 - 07/05/23    Authorization - Visit Number 9    Authorization - Number of Visits 12    Progress Note Due on Visit 20    PT Start Time 1416    PT Stop Time 1500    PT Time Calculation (min) 44 min    Activity Tolerance Patient tolerated treatment well    Behavior During Therapy [facility] for tasks assessed/performed                           Past Medical History:  Diagnosis Date   Allergic rhinitis 02/02/2016   Anemia 12/17/2009   Formatting of this note might be different from the original. 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 Hypertension   Benign paroxysmal positional vertigo 09/14/2021   Bilateral sacroiliitis (HCC) 07/22/2021   Body mass index (BMI) 25.0-25.9, adult 11/02/2020   Cancer (HCC)    skin cancer   Cardiac murmur 07/25/2021   Carotid stenosis    a. Carotid U/S 5/13: LICA < 50%, RICA 50-69%;  b.  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   Cystoid macular edema of both eyes 07/28/2020    Degenerative disc disease, lumbar 06/30/2015   Deviated nasal septum 02/18/2015   Diverticulosis 03/03/2020   DJD (degenerative joint disease) of hip 12/24/2012   Dyspnea 06/12/2011   Emphysema, unspecified (HCC) 03/05/2020   Erectile dysfunction 06/20/2017   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   Gait disorder 03/02/2021   GERD (gastroesophageal reflux disease)    GOUT 05/13/2009   Qualifier: Diagnosis of  By: Kem Parkinson     Greater trochanteric pain syndrome 07/22/2021   Hand pain, left 01/16/2022   Hearing loss 02/02/2016   Heme positive stool 02/29/2020   HIATAL HERNIA 05/13/2009   Qualifier: Diagnosis of  By: Kem Parkinson     High risk medication use 01/18/2012   Hip pain 03/02/2021   History of chicken pox    History of COVID-19 12/05/2021   History of hemorrhoids    History of kidney stones    History of skin cancer 07/22/2021   skin cancer   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 Cardiology - Dr Estrella Myrtle at [facility] cardiology  ICD-10 cut over   Hypertension  under control   Hypokalemia 07/08/2021   Hypothyroidism    Internal hemorrhoids 03/03/2020   Leg cramps 11/13/2019   Lower GI bleed 12/12/2012   Lumbago of lumbar region with sciatica 10/21/2020   Lumbar radiculopathy 03/02/2021   Lumbar spondylosis 11/02/2020   NEPHROLITHIASIS 05/13/2009   Qualifier: Diagnosis of  By: Kem Parkinson     Nonspecific abnormal electrocardiogram (ECG) (EKG) 01/06/2013   Numbness of hand 01/24/2022   Onychomycosis 06/30/2015   Osteoarthrosis, 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 Osteoarthritis Of The Hand  10/1 IMO update   Osteoarthrosis, unspecified whether generalized or localized,  pelvic region and thigh 07/25/2010   Formatting of this note might be different from the original. Osteoarthritis Of The Hip   Other abnormal glucose 07/22/2021   Other allergic rhinitis 02/18/2015   Palpitations 07/25/2021   Peripheral neuropathy 06/19/2016   Preventative health care 11/24/2015   Right groin pain 11/29/2012   Right inguinal hernia 05/13/2009   Qualifier: Diagnosis of  By: Kem Parkinson     RLS (restless legs syndrome) 02/18/2015   Rosacea    Rotator cuff tear 07/27/2013   Situational depression 03/02/2021   Skin lesion 01/16/2022   Sleep apnea with use of continuous positive airway pressure (CPAP) 07/15/2013   CPAP set to  7 cm water,  Residual AHi was 7.8  With no leak.  User time 6 hours.  479 days , not used only 12 days - highly compliant 06-23-13  .    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 Fawn Kirk   Ulnar neuropathy 01/24/2022   Urinary retention 11/26/2020   Ventral hernia 07/08/2012   Vertigo 07/08/2021   Weight loss 03/03/2020   Past Surgical History:  Procedure Laterality Date   ABDOMINAL HERNIA REPAIR  07/15/12   Dr Ashley Jacobs   ANTERIOR CERVICAL DECOMP/DISCECTOMY FUSION N/A 07/09/2020   Procedure: ANTERIOR CERVICAL DECOMPRESSION AND FUSION CERVICAL THREE-FOUR.;  Surgeon: Donalee Citrin, MD;  Location: [facility] OR;  Service: Neurosurgery;  Laterality: N/A;  anterior   BROW LIFT  05/07/01   COLONOSCOPY WITH PROPOFOL N/A 04/24/2022   Procedure: COLONOSCOPY WITH PROPOFOL;  Surgeon: Tressia Danas, MD;  Location: [facility] ENDOSCOPY;  Service: Gastroenterology;  Laterality: N/A;   COLONOSCOPY WITH PROPOFOL N/A 04/26/2022   Procedure: COLONOSCOPY WITH PROPOFOL;  Surgeon: Tressia Danas, MD;  Location: [facility] ENDOSCOPY;  Service: Gastroenterology;  Laterality: N/A;   CYSTOSCOPY WITH URETEROSCOPY AND STENT PLACEMENT Right 01/28/2014   Procedure: CYSTOSCOPY WITH URETEROSCOPY, BASKET RETRIVAL AND  STENT PLACEMENT;  Surgeon: Valetta Fuller, MD;  Location: [facility] ORS;  Service: Urology;  Laterality: Right;   epidural injections     multiple procedures   ESOPHAGEAL DILATION  1993 and 1994   multiple times   EYE SURGERY Bilateral 03/25/10, 2012   cataract removal   HEMORRHOID SURGERY  2014   HIATAL HERNIA REPAIR  12/21/93   HOLMIUM LASER APPLICATION Right 01/28/2014   Procedure: HOLMIUM LASER APPLICATION;  Surgeon: Valetta Fuller, MD;  Location: [facility] ORS;  Service: Urology;  Laterality: Right;   INGUINAL HERNIA REPAIR Right 07/15/12   Dr Ashley Jacobs, x2   JOINT REPLACEMENT  07/25/04   right knee   JOINT REPLACEMENT  07/13/10   left knee   KNEE SURGERY  1995   LAPAROSCOPIC CHOLECYSTECTOMY  09/20/06   with intraoperative cholangiogram and right inguinal herniorrhaphy with mesh    LIGAMENT REPAIR Left 07/2013   shoulder   LITHOTRIPSY     x2   LUMBAR LAMINECTOMY/DECOMPRESSION MICRODISCECTOMY Bilateral 12/27/2022   Procedure: Lumbar Two-Three, Lumbar Three-Four Sublaminar Decompression;  Surgeon: Donalee Citrin, MD;  Location: [facility] OR;  Service: Neurosurgery;  Laterality: Bilateral;   NASAL SEPTUM SURGERY  1975   POLYPECTOMY  04/26/2022   Procedure: POLYPECTOMY;  Surgeon: Tressia Danas, MD;  Location: [facility] ENDOSCOPY;  Service: Gastroenterology;;   POSTERIOR CERVICAL FUSION/FORAMINOTOMY N/A 11/24/2020   Procedure: Posterior Cervical Laminectomy Cervical three-four, Cervical four-five with lateral mass fusion;  Surgeon: Donalee Citrin, MD;  Location: [facility] OR;  Service: Neurosurgery;  Laterality: N/A;   REFRACTIVE SURGERY Bilateral    Right total hip replacement  4/14   SKIN CANCER DESTRUCTION     nose, ear   TONSILLECTOMY  as child   TOTAL HIP ARTHROPLASTY Left    URETHRAL DILATION  1991   Dr.  Annabell Howells   Patient Active Problem List   Diagnosis Date Noted   Spinal stenosis of lumbar region 12/27/2022   Drowsiness 12/07/2022   Preop examination 12/06/2022   Weakness of lower extremity 10/10/2022   Skin tear of right elbow without complication 09/18/2022   Skin tear of forearm without complication, left, subsequent encounter 09/18/2022   Fall 09/18/2022   Iron deficiency anemia 09/18/2022   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   Vitamin D deficiency 08/07/2022   Closed fracture of left hip (HCC) 08/07/2022   Hand weakness 05/02/2022   Acute kidney injury (HCC) 05/02/2022   Adenomatous polyp of ascending colon    Adenomatous polyp of colon    Acute metabolic encephalopathy 04/24/2022   GI bleed 04/21/2022   Vasovagal syncope    Hemorrhagic shock (HCC)    Hematochezia    Irregular heart beat 02/03/2022   Constipation 02/03/2022   Carpal tunnel syndrome 01/24/2022   Numbness of hand 01/24/2022   Ulnar neuropathy 01/24/2022   Skin lesion 01/16/2022   History of COVID-19 12/05/2021   Benign paroxysmal positional vertigo 09/14/2021   Palpitations 07/25/2021   Cardiac murmur 07/25/2021   History of chicken pox 07/22/2021   History of hemorrhoids 07/22/2021   COPD (chronic obstructive pulmonary disease) (HCC) 07/22/2021   History of kidney stones 07/22/2021   History of skin cancer 07/22/2021   Complication of anesthesia 07/22/2021   Bilateral sacroiliitis (HCC) 07/22/2021   Other abnormal glucose 07/22/2021   Spondylitis (HCC) 07/22/2021   Greater trochanteric pain syndrome 07/22/2021   Vertigo 07/08/2021   Hypokalemia 07/08/2021   Trochanteric bursitis of left hip 06/30/2021   Cancer (HCC) 04/11/2021   Lumbar radiculopathy 03/02/2021   Gait instability 03/02/2021   Hip pain 03/02/2021   Situational depression 03/02/2021   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   Urinary retention 11/26/2020    Cervical myelopathy (HCC) 11/24/2020   Body mass index (BMI) 25.0-25.9, adult 11/02/2020   Lumbar spondylosis 11/02/2020   Lumbago of lumbar region with sciatica 10/21/2020   Cystoid macular edema of both eyes 07/28/2020   Status post cervical spinal fusion 07/09/2020   History of total bilateral knee replacement 07/08/2020   Aortic atherosclerosis (HCC) 03/05/2020   Emphysema, unspecified (HCC) 03/05/2020   Arthritis 03/04/2020   Diverticulosis 03/03/2020   Internal hemorrhoids 03/03/2020   Weight loss 03/03/2020  Heme positive stool 02/29/2020   Leg cramps 11/13/2019   Tibialis anterior tendon tear, nontraumatic 11/13/2019   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 07/29/2018   CPAP (continuous positive airway pressure) dependence 03/25/2018   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   Erectile dysfunction 06/20/2017   Stenosis of right carotid artery without cerebral infarction 03/06/2017   Peripheral neuropathy 06/19/2016   Asymmetric SNHL (sensorineural hearing loss) 02/29/2016   Hearing loss 02/02/2016   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   Allergic rhinitis 02/02/2016   Atypical chest pain 11/24/2015   Preventative health care 11/24/2015   Onychomycosis 06/30/2015   Degenerative disc disease, lumbar 06/30/2015   Other allergic rhinitis 02/18/2015   Deviated nasal septum 02/18/2015   RLS (restless legs syndrome) 02/18/2015   GERD (gastroesophageal reflux disease) 09/18/2014   Hyperglycemia 03/03/2014   Rotator cuff tear 07/27/2013   Sleep apnea with use of continuous positive airway pressure (CPAP) 07/15/2013   Carotid stenosis 02/24/2013   Nonspecific abnormal electrocardiogram (ECG) (EKG) 01/06/2013   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   Lower GI bleed 12/12/2012   Right groin pain 11/29/2012   OSA (obstructive sleep apnea) 11/25/2012   Ventral hernia 07/08/2012   High risk medication use 01/18/2012   Dyspnea 06/12/2011   Hyperlipidemia 12/27/2010    Osteoarthrosis, unspecified whether generalized or localized, pelvic region and thigh 07/25/2010   Osteoarthrosis, hand 06/13/2010   Anemia 12/17/2009   Hypothyroidism 05/13/2009   GOUT 05/13/2009   Right inguinal hernia 05/13/2009   HIATAL HERNIA 05/13/2009   NEPHROLITHIASIS 05/13/2009   ROSACEA 05/13/2009   Hypertension 05/13/2009    PCP: [name], NP   REFERRING PROVIDER: Susa Raring., MD   REFERRING DIAG:  M25.562, G89.29 (ICD-10-CM) - Chronic pain in left knee  M25.561, G89.29 (ICD-10-CM) - Chronic pain in right knee   THERAPY DIAG:  Chronic pain of both knees  Muscle weakness (generalized)  Unsteadiness on feet  Other abnormalities of gait and mobility  Repeated falls  Other low back pain  RATIONALE FOR EVALUATION AND TREATMENT: Rehabilitation  ONSET DATE: off and on for a couple years  NEXT MD VISIT: none scheduled   SUBJECTIVE:                                                                                                                                                                                                         SUBJECTIVE STATEMENT: Pt reports his back was very weak and painful this morning, now is not bad.  PAIN: Are you having pain? No - B knees  Are  you having pain? No - low back   PERTINENT HISTORY:  L2-3, L3-4 Sublaminar Decompression on 12/27/22; Revision of femoral component of L THA on 07/02/22 s/p fall with periprosthetic fracture; Aortic atherosclerosis; carotid stenosis; cervical myelopathy s/p ACDF 06/2020 and posterior cervical fusion 11/2020; peripheral neuropathy; lumbar DDD/spondylosis; lumbago with sciatica/lumbar radiculopathy; OA; B TKA; B THA; L greater trochanteric pain syndrome; L RCR 2014; CTR; hearing loss; HTN; BPPV/vertigo; B sacroiliitis; skin cancer; COPD/emphysema; sleep apnea with CPAP; GERD; gout; HLD; LE cramps; RLS   PRECAUTIONS: Fall  WEIGHT BEARING RESTRICTIONS: No  FALLS:  Has patient  fallen in last 6 months? Yes. 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  No fracture or bony lesion or other findings.  Impression: Right knee replacement in good condition.   01/31/23 - L knee x-ray: No fracture or bony lesion or arthritic change or other abnormality.  Left  knee replacement looks well-fixed and in excellent position.  Impression: Knee replacement in good condition.   PATIENT SURVEYS:  LEFS 31 / 80 = 38.8 % 05/10/23: 32 / 80 = 40.0 %  COGNITION: Overall cognitive status: Within functional limits for tasks assessed and History of cognitive impairments - at baseline    SENSATION: Intermittent peripheral neuropathy in B feet  MUSCLE LENGTH: TBA Hamstrings:  ITB:  Piriformis:  Hip flexors:  Quads:  Heelcord:   POSTURE:  rounded shoulders, forward head, and flexed trunk   LOWER EXTREMITY ROM: Limited B hip and ankle ROM Active ROM Right eval Left eval Right 04/03/23 Left 04/03/23 R/L 05/28/23  Knee flexion 104 96 110 108   Knee extension -10 -5 4 2 4  in LAQ/ 1 in LAQ   Passive ROM Right eval Left  eval  Knee flexion    Knee extension 0 0  (Blank rows = not tested)  LOWER EXTREMITY MMT:  MMT Right Eval* Left Eval* R * 05/10/23 L * 05/10/23  Hip flexion 3+ 4 3+ 4-  Hip extension 3- 3+ 3+ 3+  Hip abduction 4- 4 4 4   Hip adduction 4 4 4 4   Hip internal rotation 3+ 4- 3+ 4-  Hip external rotation 2+ 3- 2+ 3-  Knee flexion 4 4 4+ 4+  Knee extension 4 4+ 4 4  Ankle dorsiflexion 4+ 4 4+ 4-  Ankle plantarflexion      Ankle inversion      Ankle eversion       (Blank rows = not tested, *tested in sitting)  FUNCTIONAL TESTS: (Remaining tests to be assessed next visit with PT) 5 times sit to stand: 18.94 sec  w/o UE assist (uncontrolled descent on most reps) (03/22/23) Timed up and go (TUG): 21.29 sec with RW (03/22/23) 10 meter walk test: 21.13 sec with RW; Gait speed = 1.55 ft/sec  Berg Balance Scale: 35/56; <36 = High risk for falls (close to 100%) (03/22/23) Dynamic Gait Index: 11/24; Scores of 19 or less are predictive of falls in older community living adults (03/22/23)  04/17/23: TUG: 20 sec  5xSTS: 18 sec  05/10/23: 5xSTS: 13.31 sec w/o UE assist TUG: 21.06 with RW, difficulty with turning : 14.25 sec with RW Gait speed: 2.30 ft/sec with RW DGI: 13/24  05/17/23: Sharlene Motts: 32/56  GAIT: Distance walked: 60 ft Assistive device utilized: Environmental consultant - 2 wheeled Level of assistance: SBA Gait pattern: step through pattern, decreased stride length, decreased hip/knee flexion- Right, decreased hip/knee flexion- Left, decreased ankle dorsiflexion- Right, decreased ankle dorsiflexion- Left, trunk flexed, poor foot clearance- Right, and poor foot clearance- Left Comments: Cues necessary for improved upright posture and rolling walker proximity   TODAY'S TREATMENT:  06/19/23 THERAPEUTIC EXERCISE: to improve flexibility, strength and mobility.  Demonstration, verbal and tactile cues throughout for technique. NuStep - L6 x 7 min R/L step over pool noodle fwd x 10; lateral x 10  Church pews  R/L x 10 intermittent UE support Seated with narrow BOS: -Side to side with blue weighted ball x 10  -Basketball chest press with blue weight ball from each side x 10 -Im a winner with blue weight ball to each side x 10  06/14/23 THERAPEUTIC EXERCISE: to improve flexibility, strength and mobility.  Demonstration, verbal and tactile cues throughout for technique. NuStep - L6 x 6 min Seated on balance disk: Rows GTB x 10  Shld ext GTB x 10  TUG test: 17.21 sec  Knee flexion 20# 3x10 BLE Knee extension 15# 2x10 BLE Step ups 4' x 10 BLE Seated pallof press GTB x 10 bil  06/12/23 GAIT TRAINING: To normalize gait pattern and improve safety with FWW vs RW . 270 ft with with pt's collapsible FWW - cues for increased hip and knee flexion with heel strike on weight acceptance for better foot clearance (pt repeated catching toes) as well as increased upright posture and FWW proximity 180 ft with standard RW (what pt uses at home) - better posture and RW proximity with fewer instances of toes catching 150 ft with with pt's collapsible FWW - cues for carryover of stepping pattern used with stepping over 1/2 FR while maintaining FWW proximity  THERAPEUTIC EXERCISE: to improve flexibility, strength and mobility.  Demonstration, verbal and tactile cues throughout for technique. NuStep - L6 x 6 min Standing runner's gastroc and soleus stretches 2 x 30" each bil Standing heel/toes raises at counter 10 x 3", 2 sets - limited lift on L with toe raises; UE support on counter  NEUROMUSCULAR RE-EDUCATION: To improve balance, proprioception, coordination, reduce fall risk, amplitude of movement, and reduce rigidity. R/L progressing to alternating step-over 1/2 FR and back focusing on upright posture with exaggerated hip and knee flexion and ankle DF targeting heel strike on weight acceptance through flat foot on fwd leg with initiation of toe-off on trailing leg, then initiating step back to starting position  with active DF progressing into hip and knee flexion for clearance over 1/2 FR; UE support on counter and back of chair for balance   06/06/23 THERAPEUTIC EXERCISE: to improve flexibility, strength and mobility.  Demonstration, verbal and tactile cues throughout for technique. Seated trunk twist blue weight ball x  10 Knee flexion 15# BLE 3x10 Knee extension 10# BLE 3x10  GAIT TRAINING: To normalize gait pattern and improve safety with FWW/RW. Gait 230, 180 ft with RW - cues for heel toe pattern, upright posture   05/30/23 THERAPEUTIC EXERCISE: to improve flexibility, strength and mobility.  Demonstration, verbal and tactile cues throughout for technique. NuStep - L6 x 8 min R/L negative heel gastroc stretch with toes on 2" block/book 3 x 30" Standing heel/toe weight shift/raises 2 x 10, UE support on counter Standing hip and knee flexion march + DF 2 x 10, 2nd set with looped YTB around forefoot; UE support on counter Seated R/L YTB ankle DF 2 x 10 each  NEUROMUSCULAR RE-EDUCATION: To improve balance, proprioception, coordination, reduce fall risk, amplitude of movement, and reduce rigidity. Alt toe clears to 9" step x 20 - emphasis on active DF for toe clearance at edge of step and targeting heel touch on step to keep DF's engaged; UE support on counter and back of chair for balance Alt step-over 1/2 FR and back focusing on upright posture with exaggerated hip and knee flexion and ankle DF targeting heel strike on weight acceptance through flat foot on fwd leg with initiation of toe-off on trailing leg, then initiating step back to starting position with active DF progressing into hip and knee flexion for clearance over 1/2 FR; UE support on counter and back of chair for balance  GAIT TRAINING: To normalize gait pattern and improve safety with FWW/RW . 180 ft with FWW - focusing on upright posture with exaggerated hip and knee flexion and ankle DF targeting heel strike on weight acceptance -  cues to avoid excessive stride length to improve RW proximity    PATIENT EDUCATION:  Education details: gait safety with RW   Person educated: Patient Education method: Explanation, Demonstration, and Verbal cues Education comprehension: verbalized understanding, verbal cues required, and needs further education  HOME EXERCISE PROGRAM: Access Code: [email or web address] URL: [email or web address] Date: 05/28/2023 Prepared by: Verta Ellen  Exercises - Seated Hamstring Stretch with Strap  - 1 x daily - 7 x weekly - 3 sets - 30 sec hold - Seated Quad Set  - 1 x daily - 7 x weekly - 3 sets - 10 reps - 5 seconds hold - Seated Hip Adduction Squeeze with Ball  - 1 x daily - 7 x weekly - 3 sets - 10 reps - 5 secs hold - Supine Quad Set on Towel Roll  - 1 x daily - 7 x weekly - 2 sets - 10 reps - 3 sec hold - Hooklying Single Leg Bent Knee Fallouts with Resistance  - 1 x daily - 7 x weekly - 2 sets - 10 reps - 3 sec hold - Supine Bridge with Resistance Band  - 1 x daily - 7 x weekly - 2 sets - 10 reps - 5 sec hold - Seated Long Arc Quad  - 1 x daily - 7 x weekly - 3 sets - 10 reps - Standing Gastroc Stretch at Counter  - 1 x daily - 7 x weekly - 3 sets - 3 reps - 30 sec hold - Seated Shoulder Horizontal Abduction with Resistance - Palms Down  - 1 x daily - 7 x weekly - 3 sets - 10 reps - Seated Anti-Rotation Press With Anchored Resistance  - 1 x daily - 7 x weekly - 3 sets - 10 reps   ASSESSMENT:  CLINICAL IMPRESSION: Flournoy Bayerl" shows good tolerance for exercise.  Still shows catching on L foot when walking at times. Needed many cues for proper position, form, and posture with the stepping exercises. Many cues required to correct form with the church pews as well. He continues to have intermittent LBP, mostly in the am, he continues to benefit from core strengthening as well as exercises to improve gait pattern. [name] will benefit from continued skilled PT to address ongoing intermittent  pain, strength and balance deficits for improved mobility with decreased pain interference and decreased risk for recurrent falls.  OBJECTIVE IMPAIRMENTS: Abnormal gait, decreased activity tolerance, decreased balance, decreased coordination, decreased endurance, decreased knowledge of condition, decreased knowledge of use of DME, decreased mobility, difficulty walking, decreased ROM, decreased strength, decreased safety awareness, increased fascial restrictions, impaired perceived functional ability, increased muscle spasms, impaired flexibility, impaired sensation, improper body mechanics, postural dysfunction, and pain.   ACTIVITY LIMITATIONS: carrying, lifting, bending, sitting, standing, squatting, sleeping, stairs, transfers, bed mobility, bathing, locomotion level, and caring for others  PARTICIPATION LIMITATIONS: meal prep, cleaning, laundry, driving, shopping, and community activity  PERSONAL FACTORS: Age, Behavior pattern, Fitness, Past/current experiences, Time since onset of injury/illness/exacerbation, and 3+ comorbidities: L2-3, L3-4 Sublaminar Decompression on 12/27/22; Revision of femoral component of L THA on 07/02/22 s/p fall with periprosthetic fracture; Aortic atherosclerosis; carotid stenosis; 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   REHAB POTENTIAL: Good  CLINICAL DECISION MAKING: Evolving/moderate complexity  EVALUATION COMPLEXITY: Moderate   GOALS: Goals reviewed with patient? Yes  SHORT TERM GOALS: Target date: 04/10/2023, extended to 06/07/2023  Patient will be independent with initial  HEP. Baseline:  Goal status: MET   2.  Complete balance testing and establish additional STG's/LTG's as appropriate. Baseline:  Goal status: MET  03/22/23  3.  Patient will improve 5x STS time to </= 16 seconds with good eccentric control for improved efficiency and safety with transfers Baseline: 18.94 sec w/o UE assist (uncontrolled descent on most reps) Goal status: MET  05/10/23 - 13.31 sec w/o UE assist with good stability upon standing and improved eccentric control  4.  Patient will demonstrate decreased TUG time to </= 18 sec to decrease risk for falls with transitional mobility Baseline: 21.29 sec with RW; 04/17/23 - 20 sec with RW Goal status: MET   06/14/23- 17.21 sec  LONG TERM GOALS: Target date: 05/08/2023, extended to 07/05/2023  Patient will be independent with advanced/ongoing HEP to improve outcomes and carryover.  Baseline:  Goal status: IN PROGRESS  05/10/23 - pt reports difficulty using TB due to inability to get the band over his legs and has been using his hands for manual resistance  2.  Patient will report at least 50-75% improvement in B knee pain with no further instances of subluxation to improve QOL. Baseline: 5/10 Goal status: MET  05/10/23 - no recent knee pain reported  3.  Patient will demonstrate improved B knee extension AROM to [facility] to allow for improved stability for normal gait and stair mechanics. Baseline: Refer to above LE ROM table Goal status: IN PROGRESS  05/28/23 - refer to ROM table  4.  Patient will demonstrate improved B LE strength to >/= 4 to 4+/5 for improved stability and ease of mobility. Baseline:  Refer to above LE MMT table Goal status: IN PROGRESS  05/10/23 - overall LE strength essentially unchanged  5.  Patient will be able to ambulate 400' with LRAD and normal gait pattern without increased pain to access community.  Baseline:  Goal status: IN PROGRESS  06/12/23 - 200' with RW and frequent cues for upright posture and improved RW  proximity  6. Patient will be able to ascend/descend stairs with 1 HR and reciprocal step pattern safely to access home and community.  Baseline:  Goal status: IN PROGRESS  05/10/23 - able to complete reciprocal ascent with left HR and R HHA/CGA of PT (reports he uses cane for other hand at home), step to pattern on descent with continued need for R HHA/CGA PT  7.  Patient will report >/= 40/80 on LEFS to demonstrate improved functional ability. Baseline: 31 / 80 = 38.8 % Goal status: IN PROGRESS  05/10/23 - 32 / 80 = 40.0 %  8.  Patient will demonstrate at least 19/24 on DGI to decrease risk of falls. Baseline: 11/24 (03/22/23) Goal status: IN PROGRESS  05/10/23 - 13/24  9.  Patient will improve Berg score by at least 8 points to improve safety and stability with ADLs in standing and reduce risk for falls. Baseline: 35/56 (03/22/23) Goal status: IN PROGRESS  05/17/23 - 32/56   PLAN:  PT FREQUENCY: 2x/week  PT DURATION: 6-8 weeks  PLANNED INTERVENTIONS: Therapeutic exercises, Therapeutic activity, Neuromuscular re-education, Balance training, Gait training, Patient/Family education, Self Care, Joint mobilization, Stair training, DME instructions, Aquatic Therapy, Dry Needling, Electrical stimulation, Spinal mobilization, Cryotherapy, Moist heat, Taping, Ultrasound, and Manual therapy  PLAN FOR NEXT SESSION: Reassess LE MMT and 10MWT/gait speed in upcoming visits; core/lumbopelvic/B LE flexibility and strengthening; balance training; review and update/progress HEP as indicated - look into alternative options for Thera-Band vs other types of resistance as pt reports difficulty donning band around legs; if forward flexed posture with gait persists, consider transition to standard RW   Darleene Cleaver, PTA 06/19/2023, 3:24 PM

## 2023-06-21 ENCOUNTER — Ambulatory Visit: Payer: Medicare HMO

## 2023-06-21 DIAGNOSIS — R2689 Other abnormalities of gait and mobility: Secondary | ICD-10-CM | POA: Diagnosis not present

## 2023-06-21 DIAGNOSIS — G8929 Other chronic pain: Secondary | ICD-10-CM | POA: Diagnosis not present

## 2023-06-21 DIAGNOSIS — M25561 Pain in right knee: Secondary | ICD-10-CM | POA: Diagnosis not present

## 2023-06-21 DIAGNOSIS — R296 Repeated falls: Secondary | ICD-10-CM

## 2023-06-21 DIAGNOSIS — M5459 Other low back pain: Secondary | ICD-10-CM | POA: Diagnosis not present

## 2023-06-21 DIAGNOSIS — R2681 Unsteadiness on feet: Secondary | ICD-10-CM | POA: Diagnosis not present

## 2023-06-21 DIAGNOSIS — M6281 Muscle weakness (generalized): Secondary | ICD-10-CM

## 2023-06-21 DIAGNOSIS — M25562 Pain in left knee: Secondary | ICD-10-CM | POA: Diagnosis not present

## 2023-06-21 NOTE — Therapy (Signed)
OUTPATIENT PHYSICAL THERAPY TREATMENT    Patient Name: [name] Pratt MRN: [id number] DOB:1941-08-18, 82 y.o., male Today's Date: 06/21/2023   END OF SESSION:  PT End of Session - 06/21/23 1354     Visit Number 19    Date for PT Re-Evaluation 07/05/23    Authorization Type [name]    Authorization Time Period 05/10/23 - 07/05/23    Authorization - Visit Number 10    Authorization - Number of Visits 12    Progress Note Due on Visit 20    PT Start Time 1315    PT Stop Time 1400    PT Time Calculation (min) 45 min    Activity Tolerance Patient tolerated treatment well    Behavior During Therapy [facility] for tasks assessed/performed                            Past Medical History:  Diagnosis Date   Allergic rhinitis 02/02/2016   Anemia 12/17/2009   Formatting of this note might be different from the original. 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 Hypertension   Benign paroxysmal positional vertigo 09/14/2021   Bilateral sacroiliitis (HCC) 07/22/2021   Body mass index (BMI) 25.0-25.9, adult 11/02/2020   Cancer (HCC)    skin cancer   Cardiac murmur 07/25/2021   Carotid stenosis    a. Carotid U/S 5/13: LICA < 50%, RICA 50-69%;  b.  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   Cystoid macular edema of both eyes 07/28/2020    Degenerative disc disease, lumbar 06/30/2015   Deviated nasal septum 02/18/2015   Diverticulosis 03/03/2020   DJD (degenerative joint disease) of hip 12/24/2012   Dyspnea 06/12/2011   Emphysema, unspecified (HCC) 03/05/2020   Erectile dysfunction 06/20/2017   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   Gait disorder 03/02/2021   GERD (gastroesophageal reflux disease)    GOUT 05/13/2009   Qualifier: Diagnosis of  By: Kem Parkinson     Greater trochanteric pain syndrome 07/22/2021   Hand pain, left 01/16/2022   Hearing loss 02/02/2016   Heme positive stool 02/29/2020   HIATAL HERNIA 05/13/2009   Qualifier: Diagnosis of  By: Kem Parkinson     High risk medication use 01/18/2012   Hip pain 03/02/2021   History of chicken pox    History of COVID-19 12/05/2021   History of hemorrhoids    History of kidney stones    History of skin cancer 07/22/2021   skin cancer   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 Cardiology - Dr Estrella Myrtle at [facility] cardiology  ICD-10 cut over   Hypertension  under control   Hypokalemia 07/08/2021   Hypothyroidism    Internal hemorrhoids 03/03/2020   Leg cramps 11/13/2019   Lower GI bleed 12/12/2012   Lumbago of lumbar region with sciatica 10/21/2020   Lumbar radiculopathy 03/02/2021   Lumbar spondylosis 11/02/2020   NEPHROLITHIASIS 05/13/2009   Qualifier: Diagnosis of  By: Kem Parkinson     Nonspecific abnormal electrocardiogram (ECG) (EKG) 01/06/2013   Numbness of hand 01/24/2022   Onychomycosis 06/30/2015   Osteoarthrosis, 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 Osteoarthritis Of The Hand  10/1 IMO update   Osteoarthrosis, unspecified whether generalized or localized,  pelvic region and thigh 07/25/2010   Formatting of this note might be different from the original. Osteoarthritis Of The Hip   Other abnormal glucose 07/22/2021   Other allergic rhinitis 02/18/2015   Palpitations 07/25/2021   Peripheral neuropathy 06/19/2016   Preventative health care 11/24/2015   Right groin pain 11/29/2012   Right inguinal hernia 05/13/2009   Qualifier: Diagnosis of  By: Kem Parkinson     RLS (restless legs syndrome) 02/18/2015   Rosacea    Rotator cuff tear 07/27/2013   Situational depression 03/02/2021   Skin lesion 01/16/2022   Sleep apnea with use of continuous positive airway pressure (CPAP) 07/15/2013   CPAP set to  7 cm water,  Residual AHi was 7.8  With no leak.  User time 6 hours.  479 days , not used only 12 days - highly compliant 06-23-13  .    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 Fawn Kirk   Ulnar neuropathy 01/24/2022   Urinary retention 11/26/2020   Ventral hernia 07/08/2012   Vertigo 07/08/2021   Weight loss 03/03/2020   Past Surgical History:  Procedure Laterality Date   ABDOMINAL HERNIA REPAIR  07/15/12   Dr Ashley Jacobs   ANTERIOR CERVICAL DECOMP/DISCECTOMY FUSION N/A 07/09/2020   Procedure: ANTERIOR CERVICAL DECOMPRESSION AND FUSION CERVICAL THREE-FOUR.;  Surgeon: Donalee Citrin, MD;  Location: [facility] OR;  Service: Neurosurgery;  Laterality: N/A;  anterior   BROW LIFT  05/07/01   COLONOSCOPY WITH PROPOFOL N/A 04/24/2022   Procedure: COLONOSCOPY WITH PROPOFOL;  Surgeon: Tressia Danas, MD;  Location: [facility] ENDOSCOPY;  Service: Gastroenterology;  Laterality: N/A;   COLONOSCOPY WITH PROPOFOL N/A 04/26/2022   Procedure: COLONOSCOPY WITH PROPOFOL;  Surgeon: Tressia Danas, MD;  Location: [facility] ENDOSCOPY;  Service: Gastroenterology;  Laterality: N/A;   CYSTOSCOPY WITH URETEROSCOPY AND STENT PLACEMENT Right 01/28/2014   Procedure: CYSTOSCOPY WITH URETEROSCOPY, BASKET RETRIVAL AND  STENT PLACEMENT;  Surgeon: Valetta Fuller, MD;  Location: [facility] ORS;  Service: Urology;  Laterality: Right;   epidural injections     multiple procedures   ESOPHAGEAL DILATION  1993 and 1994   multiple times   EYE SURGERY Bilateral 03/25/10, 2012   cataract removal   HEMORRHOID SURGERY  2014   HIATAL HERNIA REPAIR  12/21/93   HOLMIUM LASER APPLICATION Right 01/28/2014   Procedure: HOLMIUM LASER APPLICATION;  Surgeon: Valetta Fuller, MD;  Location: [facility] ORS;  Service: Urology;  Laterality: Right;   INGUINAL HERNIA REPAIR Right 07/15/12   Dr Ashley Jacobs, x2   JOINT REPLACEMENT  07/25/04   right knee   JOINT REPLACEMENT  07/13/10   left knee   KNEE SURGERY  1995   LAPAROSCOPIC CHOLECYSTECTOMY  09/20/06   with intraoperative cholangiogram and right inguinal herniorrhaphy with mesh    LIGAMENT REPAIR Left 07/2013   shoulder   LITHOTRIPSY     x2   LUMBAR LAMINECTOMY/DECOMPRESSION MICRODISCECTOMY Bilateral 12/27/2022   Procedure: Lumbar Two-Three, Lumbar Three-Four Sublaminar Decompression;  Surgeon: Donalee Citrin, MD;  Location: [facility] OR;  Service: Neurosurgery;  Laterality: Bilateral;   NASAL SEPTUM SURGERY  1975   POLYPECTOMY  04/26/2022   Procedure: POLYPECTOMY;  Surgeon: Tressia Danas, MD;  Location: [facility] ENDOSCOPY;  Service: Gastroenterology;;   POSTERIOR CERVICAL FUSION/FORAMINOTOMY N/A 11/24/2020   Procedure: Posterior Cervical Laminectomy Cervical three-four, Cervical four-five with lateral mass fusion;  Surgeon: Donalee Citrin, MD;  Location: [facility] OR;  Service: Neurosurgery;  Laterality: N/A;   REFRACTIVE SURGERY Bilateral    Right total hip replacement  4/14   SKIN CANCER DESTRUCTION     nose, ear   TONSILLECTOMY  as child   TOTAL HIP ARTHROPLASTY Left    URETHRAL DILATION  1991   Dr.  Annabell Howells   Patient Active Problem List   Diagnosis Date Noted   Spinal stenosis of lumbar region 12/27/2022   Drowsiness 12/07/2022   Preop examination 12/06/2022   Weakness of lower extremity 10/10/2022   Skin tear of right elbow without complication 09/18/2022   Skin tear of forearm without complication, left, subsequent encounter 09/18/2022   Fall 09/18/2022   Iron deficiency anemia 09/18/2022   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   Vitamin D deficiency 08/07/2022   Closed fracture of left hip (HCC) 08/07/2022   Hand weakness 05/02/2022   Acute kidney injury (HCC) 05/02/2022   Adenomatous polyp of ascending colon    Adenomatous polyp of colon    Acute metabolic encephalopathy 04/24/2022   GI bleed 04/21/2022   Vasovagal syncope    Hemorrhagic shock (HCC)    Hematochezia    Irregular heart beat 02/03/2022   Constipation 02/03/2022   Carpal tunnel syndrome 01/24/2022   Numbness of hand 01/24/2022   Ulnar neuropathy 01/24/2022   Skin lesion 01/16/2022   History of COVID-19 12/05/2021   Benign paroxysmal positional vertigo 09/14/2021   Palpitations 07/25/2021   Cardiac murmur 07/25/2021   History of chicken pox 07/22/2021   History of hemorrhoids 07/22/2021   COPD (chronic obstructive pulmonary disease) (HCC) 07/22/2021   History of kidney stones 07/22/2021   History of skin cancer 07/22/2021   Complication of anesthesia 07/22/2021   Bilateral sacroiliitis (HCC) 07/22/2021   Other abnormal glucose 07/22/2021   Spondylitis (HCC) 07/22/2021   Greater trochanteric pain syndrome 07/22/2021   Vertigo 07/08/2021   Hypokalemia 07/08/2021   Trochanteric bursitis of left hip 06/30/2021   Cancer (HCC) 04/11/2021   Lumbar radiculopathy 03/02/2021   Gait instability 03/02/2021   Hip pain 03/02/2021   Situational depression 03/02/2021   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   Urinary retention 11/26/2020    Cervical myelopathy (HCC) 11/24/2020   Body mass index (BMI) 25.0-25.9, adult 11/02/2020   Lumbar spondylosis 11/02/2020   Lumbago of lumbar region with sciatica 10/21/2020   Cystoid macular edema of both eyes 07/28/2020   Status post cervical spinal fusion 07/09/2020   History of total bilateral knee replacement 07/08/2020   Aortic atherosclerosis (HCC) 03/05/2020   Emphysema, unspecified (HCC) 03/05/2020   Arthritis 03/04/2020   Diverticulosis 03/03/2020   Internal hemorrhoids 03/03/2020   Weight loss 03/03/2020  Heme positive stool 02/29/2020   Leg cramps 11/13/2019   Tibialis anterior tendon tear, nontraumatic 11/13/2019   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 07/29/2018   CPAP (continuous positive airway pressure) dependence 03/25/2018   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   Erectile dysfunction 06/20/2017   Stenosis of right carotid artery without cerebral infarction 03/06/2017   Peripheral neuropathy 06/19/2016   Asymmetric SNHL (sensorineural hearing loss) 02/29/2016   Hearing loss 02/02/2016   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   Allergic rhinitis 02/02/2016   Atypical chest pain 11/24/2015   Preventative health care 11/24/2015   Onychomycosis 06/30/2015   Degenerative disc disease, lumbar 06/30/2015   Other allergic rhinitis 02/18/2015   Deviated nasal septum 02/18/2015   RLS (restless legs syndrome) 02/18/2015   GERD (gastroesophageal reflux disease) 09/18/2014   Hyperglycemia 03/03/2014   Rotator cuff tear 07/27/2013   Sleep apnea with use of continuous positive airway pressure (CPAP) 07/15/2013   Carotid stenosis 02/24/2013   Nonspecific abnormal electrocardiogram (ECG) (EKG) 01/06/2013   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   Lower GI bleed 12/12/2012   Right groin pain 11/29/2012   OSA (obstructive sleep apnea) 11/25/2012   Ventral hernia 07/08/2012   High risk medication use 01/18/2012   Dyspnea 06/12/2011   Hyperlipidemia 12/27/2010    Osteoarthrosis, unspecified whether generalized or localized, pelvic region and thigh 07/25/2010   Osteoarthrosis, hand 06/13/2010   Anemia 12/17/2009   Hypothyroidism 05/13/2009   GOUT 05/13/2009   Right inguinal hernia 05/13/2009   HIATAL HERNIA 05/13/2009   NEPHROLITHIASIS 05/13/2009   ROSACEA 05/13/2009   Hypertension 05/13/2009    PCP: [name], NP   REFERRING PROVIDER: Susa Raring., MD   REFERRING DIAG:  M25.562, G89.29 (ICD-10-CM) - Chronic pain in left knee  M25.561, G89.29 (ICD-10-CM) - Chronic pain in right knee   THERAPY DIAG:  Chronic pain of both knees  Muscle weakness (generalized)  Unsteadiness on feet  Other abnormalities of gait and mobility  Repeated falls  Other low back pain  RATIONALE FOR EVALUATION AND TREATMENT: Rehabilitation  ONSET DATE: off and on for a couple years  NEXT MD VISIT: none scheduled   SUBJECTIVE:                                                                                                                                                                                                         SUBJECTIVE STATEMENT: No new changes today.  PAIN: Are you having pain? No - B knees  Are you having pain? No - low back   PERTINENT HISTORY:  L2-3, L3-4 Sublaminar Decompression on 12/27/22; Revision of femoral component of L THA on 07/02/22 s/p fall with periprosthetic fracture; Aortic atherosclerosis; carotid stenosis; cervical myelopathy s/p ACDF 06/2020 and posterior cervical fusion 11/2020; peripheral neuropathy; lumbar DDD/spondylosis; lumbago with sciatica/lumbar radiculopathy; OA; B TKA; B THA; L greater trochanteric pain syndrome; L RCR 2014; CTR; hearing loss; HTN; BPPV/vertigo; B sacroiliitis; skin cancer; COPD/emphysema; sleep apnea with CPAP; GERD; gout; HLD; LE cramps; RLS   PRECAUTIONS: Fall  WEIGHT BEARING RESTRICTIONS: No  FALLS:  Has patient fallen in last 6 months? Yes. 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  No fracture or bony lesion or other findings.  Impression: Right knee replacement in good condition.   01/31/23 - L knee x-ray: No fracture or bony lesion or arthritic change or other abnormality.  Left  knee replacement looks well-fixed and in excellent position.  Impression: Knee replacement in good condition.   PATIENT SURVEYS:  LEFS 31 / 80 = 38.8 % 05/10/23: 32 / 80 = 40.0 %  COGNITION: Overall cognitive status: Within functional limits for tasks assessed and History of cognitive impairments - at baseline    SENSATION: Intermittent peripheral neuropathy in B feet  MUSCLE LENGTH: TBA Hamstrings:  ITB:  Piriformis:  Hip flexors:  Quads:  Heelcord:   POSTURE:  rounded shoulders, forward head, and flexed trunk   LOWER EXTREMITY ROM: Limited B hip and ankle ROM Active ROM Right eval Left eval Right 04/03/23 Left 04/03/23 R/L 05/28/23  Knee flexion 104 96 110 108   Knee extension -10 -5 4 2 4  in LAQ/ 1 in LAQ   Passive ROM Right eval Left eval  Knee flexion    Knee extension 0 0  (Blank rows =  not tested)  LOWER EXTREMITY MMT:  MMT Right Eval* Left Eval* R * 05/10/23 L * 05/10/23 R/L 06/21/23  Hip flexion 3+ 4 3+ 4- 4-/4  Hip extension 3- 3+ 3+ 3+ 4/4+  Hip abduction 4- 4 4 4  4/4  Hip adduction 4 4 4 4  4/4+  Hip internal rotation 3+ 4- 3+ 4-   Hip external rotation 2+ 3- 2+ 3-   Knee flexion 4 4 4+ 4+   Knee extension 4 4+ 4 4 4/4+  Ankle dorsiflexion 4+ 4 4+ 4-   Ankle plantarflexion       Ankle inversion       Ankle eversion        (Blank rows = not tested, *tested in sitting)  FUNCTIONAL TESTS: (Remaining tests to be assessed next visit with PT) 5 times sit to stand:  18.94 sec w/o UE assist (uncontrolled descent on most reps) (03/22/23) Timed up and go (TUG): 21.29 sec with RW (03/22/23) 10 meter walk test: 21.13 sec with RW; Gait speed = 1.55 ft/sec  Berg Balance Scale: 35/56; <36 = High risk for falls (close to 100%) (03/22/23) Dynamic Gait Index: 11/24; Scores of 19 or less are predictive of falls in older community living adults (03/22/23)  04/17/23: TUG: 20 sec  5xSTS: 18 sec  05/10/23: 5xSTS: 13.31 sec w/o UE assist TUG: 21.06 with RW, difficulty with turning : 14.25 sec with RW Gait speed: 2.30 ft/sec with RW DGI: 13/24  05/17/23: Sharlene Motts: 32/56  GAIT: Distance walked: 60 ft Assistive device utilized: Environmental consultant - 2 wheeled Level of assistance: SBA Gait pattern: step through pattern, decreased stride length, decreased hip/knee flexion- Right, decreased hip/knee flexion- Left, decreased ankle dorsiflexion- Right, decreased ankle dorsiflexion- Left, trunk flexed, poor foot clearance- Right, and poor foot clearance- Left Comments: Cues necessary for improved upright posture and rolling walker proximity   TODAY'S TREATMENT:  06/21/23 THERAPEUTIC EXERCISE: to improve flexibility, strength and mobility.  Demonstration, verbal and tactile cues throughout for technique. NuStep - L6 x 6 min LE MMT Seated LAQ 3# x 10 bil Seated marching 3# 2 x 10 bil Standing hip  abduction x 10 2# Standing hip extension x 10 2# Standing HS curls x 10 2# GAIT TRAINING: To normalize gait pattern and improve safety. With RW - cues for upright posture and heel strike, foot clearance  06/19/23 THERAPEUTIC EXERCISE: to improve flexibility, strength and mobility.  Demonstration, verbal and tactile cues throughout for technique. NuStep - L6 x 7 min R/L step over pool noodle fwd x 10; lateral x 10  Church pews R/L x 10 intermittent UE support Seated with narrow BOS: -Side to side with blue weighted ball x 10  -Basketball chest press with blue weight ball from each side x 10 -Im a winner with blue weight ball to each side x 10  06/14/23 THERAPEUTIC EXERCISE: to improve flexibility, strength and mobility.  Demonstration, verbal and tactile cues throughout for technique. NuStep - L6 x 6 min Seated on balance disk: Rows GTB x 10  Shld ext GTB x 10  TUG test: 17.21 sec  Knee flexion 20# 3x10 BLE Knee extension 15# 2x10 BLE Step ups 4' x 10 BLE Seated pallof press GTB x 10 bil  06/12/23 GAIT TRAINING: To normalize gait pattern and improve safety with FWW vs RW . 270 ft with with pt's collapsible FWW - cues for increased hip and knee flexion with heel strike on weight acceptance for better foot clearance (pt repeated catching toes) as well as increased upright posture and FWW proximity 180 ft with standard RW (what pt uses at home) - better posture and RW proximity with fewer instances of toes catching 150 ft with with pt's collapsible FWW - cues for carryover of stepping pattern used with stepping over 1/2 FR while maintaining FWW proximity  THERAPEUTIC EXERCISE: to improve flexibility, strength and mobility.  Demonstration, verbal and tactile cues throughout for technique. NuStep - L6 x 6 min Standing runner's gastroc and soleus stretches 2 x 30" each bil Standing heel/toes raises at counter 10 x 3", 2 sets - limited lift on L with toe raises; UE support on  counter  NEUROMUSCULAR RE-EDUCATION: To improve balance, proprioception, coordination, reduce fall risk, amplitude of movement, and reduce rigidity. R/L progressing to alternating step-over 1/2 FR and back focusing on upright posture with exaggerated hip and knee  flexion and ankle DF targeting heel strike on weight acceptance through flat foot on fwd leg with initiation of toe-off on trailing leg, then initiating step back to starting position with active DF progressing into hip and knee flexion for clearance over 1/2 FR; UE support on counter and back of chair for balance   06/06/23 THERAPEUTIC EXERCISE: to improve flexibility, strength and mobility.  Demonstration, verbal and tactile cues throughout for technique. Seated trunk twist blue weight ball x 10 Knee flexion 15# BLE 3x10 Knee extension 10# BLE 3x10  GAIT TRAINING: To normalize gait pattern and improve safety with FWW/RW. Gait 230, 180 ft with RW - cues for heel toe pattern, upright posture   05/30/23 THERAPEUTIC EXERCISE: to improve flexibility, strength and mobility.  Demonstration, verbal and tactile cues throughout for technique. NuStep - L6 x 8 min R/L negative heel gastroc stretch with toes on 2" block/book 3 x 30" Standing heel/toe weight shift/raises 2 x 10, UE support on counter Standing hip and knee flexion march + DF 2 x 10, 2nd set with looped YTB around forefoot; UE support on counter Seated R/L YTB ankle DF 2 x 10 each  NEUROMUSCULAR RE-EDUCATION: To improve balance, proprioception, coordination, reduce fall risk, amplitude of movement, and reduce rigidity. Alt toe clears to 9" step x 20 - emphasis on active DF for toe clearance at edge of step and targeting heel touch on step to keep DF's engaged; UE support on counter and back of chair for balance Alt step-over 1/2 FR and back focusing on upright posture with exaggerated hip and knee flexion and ankle DF targeting heel strike on weight acceptance through flat foot on  fwd leg with initiation of toe-off on trailing leg, then initiating step back to starting position with active DF progressing into hip and knee flexion for clearance over 1/2 FR; UE support on counter and back of chair for balance  GAIT TRAINING: To normalize gait pattern and improve safety with FWW/RW . 180 ft with FWW - focusing on upright posture with exaggerated hip and knee flexion and ankle DF targeting heel strike on weight acceptance - cues to avoid excessive stride length to improve RW proximity    PATIENT EDUCATION:  Education details: gait safety with RW   Person educated: Patient Education method: Explanation, Demonstration, and Verbal cues Education comprehension: verbalized understanding, verbal cues required, and needs further education  HOME EXERCISE PROGRAM: Access Code: [email or web address] URL: [email or web address] Date: 06/21/2023 Prepared by: Verta Ellen  Exercises - Seated Hamstring Stretch with Strap  - 1 x daily - 7 x weekly - 3 sets - 30 sec hold - Seated Quad Set  - 1 x daily - 7 x weekly - 3 sets - 10 reps - 5 seconds hold - Seated Hip Adduction Squeeze with Ball  - 1 x daily - 7 x weekly - 3 sets - 10 reps - 5 secs hold - Supine Quad Set on Towel Roll  - 1 x daily - 7 x weekly - 2 sets - 10 reps - 3 sec hold - Hooklying Single Leg Bent Knee Fallouts with Resistance  - 1 x daily - 7 x weekly - 2 sets - 10 reps - 3 sec hold - Supine Bridge with Resistance Band  - 1 x daily - 7 x weekly - 2 sets - 10 reps - 5 sec hold - Seated Long Arc Quad  - 1 x daily - 7 x weekly - 3 sets - 10 reps - Standing Gastroc Stretch  at Counter  - 1 x daily - 7 x weekly - 3 sets - 3 reps - 30 sec hold - Seated Shoulder Horizontal Abduction with Resistance - Palms Down  - 1 x daily - 7 x weekly - 3 sets - 10 reps - Seated Anti-Rotation Press With Anchored Resistance  - 1 x daily - 7 x weekly - 3 sets - 10 reps - Standing Hip Abduction with Counter Support  - 1 x daily - 3 x weekly  - 2 sets - 10 reps - Standing Hip Extension with Counter Support  - 1 x daily - 3 x weekly - 2 sets - 10 reps - Standing Knee Flexion with Counter Support  - 1 x daily - 3 x weekly - 2 sets - 10 reps - Seated Long Arc Quad  - 1 x daily - 3 x weekly - 2 sets - 10 reps - Seated March  - 1 x daily - 3 x weekly - 2 sets - 10 reps   ASSESSMENT:  CLINICAL IMPRESSION: Adrienne Winders" demonstrates improved strength for the most part, did see some plateau of certain muscle groups. Focused on strengthening and education on adjustable ankle weights to use at home for counter exercises. Overall he is grossly more weak on R side. Hopefully he will be able to obtain adjustable ankle weights for at home use. [name] will benefit from continued skilled PT to address ongoing intermittent pain, strength and balance deficits for improved mobility with decreased pain interference and decreased risk for recurrent falls.  OBJECTIVE IMPAIRMENTS: Abnormal gait, decreased activity tolerance, decreased balance, decreased coordination, decreased endurance, decreased knowledge of condition, decreased knowledge of use of DME, decreased mobility, difficulty walking, decreased ROM, decreased strength, decreased safety awareness, increased fascial restrictions, impaired perceived functional ability, increased muscle spasms, impaired flexibility, impaired sensation, improper body mechanics, postural dysfunction, and pain.   ACTIVITY LIMITATIONS: carrying, lifting, bending, sitting, standing, squatting, sleeping, stairs, transfers, bed mobility, bathing, locomotion level, and caring for others  PARTICIPATION LIMITATIONS: meal prep, cleaning, laundry, driving, shopping, and community activity  PERSONAL FACTORS: Age, Behavior pattern, Fitness, Past/current experiences, Time since onset of injury/illness/exacerbation, and 3+ comorbidities: L2-3, L3-4 Sublaminar Decompression on 12/27/22; Revision of femoral component of L THA on 07/02/22  s/p fall with periprosthetic fracture; Aortic atherosclerosis; carotid stenosis; 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   REHAB POTENTIAL: Good  CLINICAL DECISION MAKING: Evolving/moderate complexity  EVALUATION COMPLEXITY: Moderate   GOALS: Goals reviewed with patient? Yes  SHORT TERM GOALS: Target date: 04/10/2023, extended to 06/07/2023  Patient will be independent with initial HEP. Baseline:  Goal status: MET   2.  Complete balance testing and establish additional STG's/LTG's as appropriate. Baseline:  Goal status: MET  03/22/23  3.  Patient will improve 5x STS time to </= 16 seconds with good eccentric control for improved efficiency and safety with transfers Baseline: 18.94 sec w/o UE assist (uncontrolled descent on most reps) Goal status: MET  05/10/23 - 13.31 sec w/o UE assist with good stability upon standing and improved eccentric control  4.  Patient will demonstrate decreased TUG time to </= 18 sec to decrease risk for falls with transitional mobility Baseline: 21.29 sec  with RW; 04/17/23 - 20 sec with RW Goal status: MET   06/14/23- 17.21 sec  LONG TERM GOALS: Target date: 05/08/2023, extended to 07/05/2023  Patient will be independent with advanced/ongoing HEP to improve outcomes and carryover.  Baseline:  Goal status: IN PROGRESS  05/10/23 - pt reports difficulty using TB due to inability to get the band over his legs and has been using his hands for manual resistance  2.  Patient will report at least 50-75% improvement in B knee pain with no further instances of subluxation to improve  QOL. Baseline: 5/10 Goal status: MET  05/10/23 - no recent knee pain reported  3.  Patient will demonstrate improved B knee extension AROM to [facility] to allow for improved stability for normal gait and stair mechanics. Baseline: Refer to above LE ROM table Goal status: IN PROGRESS  05/28/23 - refer to ROM table  4.  Patient will demonstrate improved B LE strength to >/= 4 to 4+/5 for improved stability and ease of mobility. Baseline: Refer to above LE MMT table Goal status: IN PROGRESS  05/10/23 - overall LE strength essentially unchanged  5.  Patient will be able to ambulate 400' with LRAD and normal gait pattern without increased pain to access community.  Baseline:  Goal status: IN PROGRESS  06/12/23 - 200' with RW and frequent cues for upright posture and improved RW proximity  6. Patient will be able to ascend/descend stairs with 1 HR and reciprocal step pattern safely to access home and community.  Baseline:  Goal status: IN PROGRESS  05/10/23 - able to complete reciprocal ascent with left HR and R HHA/CGA of PT (reports he uses cane for other hand at home), step to pattern on descent with continued need for R HHA/CGA PT  7.  Patient will report >/= 40/80 on LEFS to demonstrate improved functional ability. Baseline: 31 / 80 = 38.8 % Goal status: IN PROGRESS  05/10/23 - 32 / 80 = 40.0 %  8.  Patient will demonstrate at least 19/24 on DGI to decrease risk of falls. Baseline: 11/24 (03/22/23) Goal status: IN PROGRESS  05/10/23 - 13/24  9.  Patient will improve Berg score by at least 8 points to improve safety and stability with ADLs in standing and reduce risk for falls. Baseline: 35/56 (03/22/23) Goal status: IN PROGRESS  05/17/23 - 32/56   PLAN:  PT FREQUENCY: 2x/week  PT DURATION: 6-8 weeks  PLANNED INTERVENTIONS: Therapeutic exercises, Therapeutic activity, Neuromuscular re-education, Balance training, Gait training, Patient/Family education, Self Care, Joint mobilization, Stair  training, DME instructions, Aquatic Therapy, Dry Needling, Electrical stimulation, Spinal mobilization, Cryotherapy, Moist heat, Taping, Ultrasound, and Manual therapy  PLAN FOR NEXT SESSION: Reassess LE MMT and 10MWT/gait speed in upcoming visits; core/lumbopelvic/B LE flexibility and strengthening; balance training; review and update/progress HEP as indicated - look into alternative options for Thera-Band vs other types of resistance as pt reports difficulty donning band around legs; if forward flexed posture with gait persists, consider transition to standard RW   Darleene Cleaver, PTA 06/21/2023, 2:10 PM

## 2023-06-26 ENCOUNTER — Ambulatory Visit: Payer: Medicare HMO

## 2023-06-26 DIAGNOSIS — M5459 Other low back pain: Secondary | ICD-10-CM | POA: Diagnosis not present

## 2023-06-26 DIAGNOSIS — M6281 Muscle weakness (generalized): Secondary | ICD-10-CM

## 2023-06-26 DIAGNOSIS — M25562 Pain in left knee: Secondary | ICD-10-CM | POA: Diagnosis not present

## 2023-06-26 DIAGNOSIS — R296 Repeated falls: Secondary | ICD-10-CM | POA: Diagnosis not present

## 2023-06-26 DIAGNOSIS — R2681 Unsteadiness on feet: Secondary | ICD-10-CM

## 2023-06-26 DIAGNOSIS — G8929 Other chronic pain: Secondary | ICD-10-CM | POA: Diagnosis not present

## 2023-06-26 DIAGNOSIS — R2689 Other abnormalities of gait and mobility: Secondary | ICD-10-CM | POA: Diagnosis not present

## 2023-06-26 DIAGNOSIS — M25561 Pain in right knee: Secondary | ICD-10-CM | POA: Diagnosis not present

## 2023-06-26 NOTE — Therapy (Addendum)
OUTPATIENT PHYSICAL THERAPY TREATMENT   Progress Note  Reporting Period 05/10/2023 to 06/26/2023   See note below for Objective Data and Assessment of Progress/Goals.     Patient Name: [name] Pratt MRN: [id number] DOB:15-May-1941, 82 y.o., male Today's Date: 06/26/2023   END OF SESSION:  PT End of Session - 06/26/23 1407     Visit Number 20    Date for PT Re-Evaluation 07/05/23    Authorization Type [name]    Authorization Time Period 05/10/23 - 07/05/23    Authorization - Visit Number 11    Authorization - Number of Visits 12    Progress Note Due on Visit 30    PT Start Time 1315    PT Stop Time 1400    PT Time Calculation (min) 45 min    Activity Tolerance Patient tolerated treatment well    Behavior During Therapy [facility] for tasks assessed/performed                   Past Medical History:  Diagnosis Date   Allergic rhinitis 02/02/2016   Anemia 12/17/2009   Formatting of this note might be different from the original. 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 Hypertension   Benign paroxysmal positional vertigo 09/14/2021   Bilateral sacroiliitis (HCC) 07/22/2021   Body mass index (BMI) 25.0-25.9, adult 11/02/2020   Cancer (HCC)    skin cancer   Cardiac murmur 07/25/2021   Carotid stenosis    a. Carotid U/S 5/13: LICA < 50%, RICA 50-69%;  b.  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   Cystoid macular edema of both eyes 07/28/2020   Degenerative disc disease, lumbar 06/30/2015   Deviated nasal septum 02/18/2015   Diverticulosis 03/03/2020   DJD (degenerative joint disease) of hip 12/24/2012   Dyspnea 06/12/2011   Emphysema, unspecified (HCC) 03/05/2020   Erectile dysfunction 06/20/2017   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   Gait disorder 03/02/2021   GERD (gastroesophageal reflux disease)    GOUT 05/13/2009   Qualifier: Diagnosis of  By: Kem Parkinson     Greater trochanteric pain syndrome 07/22/2021   Hand pain, left 01/16/2022   Hearing loss 02/02/2016   Heme positive stool 02/29/2020   HIATAL HERNIA 05/13/2009   Qualifier: Diagnosis of  By: Kem Parkinson     High risk medication use 01/18/2012   Hip pain 03/02/2021   History of chicken pox    History of COVID-19 12/05/2021   History of hemorrhoids    History of kidney stones    History of skin cancer 07/22/2021   skin cancer   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 Cardiology -  Dr Estrella Myrtle at [facility] cardiology  ICD-10 cut over   Hypertension    under control   Hypokalemia 07/08/2021   Hypothyroidism    Internal hemorrhoids 03/03/2020   Leg cramps 11/13/2019   Lower GI bleed 12/12/2012   Lumbago of lumbar region with sciatica 10/21/2020   Lumbar radiculopathy 03/02/2021   Lumbar spondylosis 11/02/2020   NEPHROLITHIASIS 05/13/2009   Qualifier: Diagnosis of  By: Kem Parkinson     Nonspecific abnormal electrocardiogram (ECG) (EKG) 01/06/2013   Numbness of hand 01/24/2022   Onychomycosis 06/30/2015   Osteoarthrosis, hand 06/13/2010   Formatting of this note might be different from  the original. Osteoarthritis Of The Hand  10/1 IMO update   Osteoarthrosis, unspecified whether generalized or localized, pelvic region and thigh 07/25/2010   Formatting of this note might be different from the original. Osteoarthritis Of The Hip   Other abnormal glucose 07/22/2021   Other allergic rhinitis 02/18/2015   Palpitations 07/25/2021   Peripheral neuropathy 06/19/2016   Preventative health care 11/24/2015   Right groin pain 11/29/2012   Right inguinal hernia 05/13/2009   Qualifier: Diagnosis of  By: Kem Parkinson     RLS (restless legs syndrome) 02/18/2015   Rosacea    Rotator cuff tear 07/27/2013   Situational depression 03/02/2021   Skin lesion 01/16/2022   Sleep apnea with use of continuous positive airway pressure (CPAP) 07/15/2013   CPAP set to  7 cm water,  Residual AHi was 7.8  With no leak.  User time 6 hours.  479 days , not used only 12 days - highly compliant 06-23-13  .    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 Fawn Kirk   Ulnar neuropathy 01/24/2022   Urinary retention 11/26/2020   Ventral hernia 07/08/2012   Vertigo 07/08/2021   Weight loss 03/03/2020   Past Surgical History:  Procedure Laterality Date   ABDOMINAL HERNIA REPAIR  07/15/12   Dr Ashley Jacobs   ANTERIOR CERVICAL DECOMP/DISCECTOMY FUSION N/A 07/09/2020   Procedure: ANTERIOR CERVICAL DECOMPRESSION AND FUSION CERVICAL THREE-FOUR.;  Surgeon: Donalee Citrin, MD;  Location: [facility] OR;  Service: Neurosurgery;  Laterality: N/A;  anterior   BROW LIFT  05/07/01   COLONOSCOPY WITH PROPOFOL N/A 04/24/2022   Procedure: COLONOSCOPY WITH PROPOFOL;  Surgeon: Tressia Danas, MD;  Location: [facility] ENDOSCOPY;  Service: Gastroenterology;   Laterality: N/A;   COLONOSCOPY WITH PROPOFOL N/A 04/26/2022   Procedure: COLONOSCOPY WITH PROPOFOL;  Surgeon: Tressia Danas, MD;  Location: [facility] ENDOSCOPY;  Service: Gastroenterology;  Laterality: N/A;   CYSTOSCOPY WITH URETEROSCOPY AND STENT PLACEMENT Right 01/28/2014   Procedure: CYSTOSCOPY WITH URETEROSCOPY, BASKET RETRIVAL AND  STENT PLACEMENT;  Surgeon: Valetta Fuller, MD;  Location: [facility] ORS;  Service: Urology;  Laterality: Right;   epidural injections     multiple procedures   ESOPHAGEAL DILATION  1993 and 1994   multiple times   EYE SURGERY Bilateral 03/25/10, 2012   cataract removal   HEMORRHOID SURGERY  2014   HIATAL HERNIA REPAIR  12/21/93   HOLMIUM LASER APPLICATION Right 01/28/2014   Procedure: HOLMIUM LASER APPLICATION;  Surgeon: Valetta Fuller, MD;  Location: [facility] ORS;  Service: Urology;  Laterality: Right;   INGUINAL HERNIA REPAIR Right 07/15/12   Dr Ashley Jacobs,  x2   JOINT REPLACEMENT  07/25/04   right knee   JOINT REPLACEMENT  07/13/10   left knee   KNEE SURGERY  1995   LAPAROSCOPIC CHOLECYSTECTOMY  09/20/06   with intraoperative cholangiogram and right inguinal herniorrhaphy with mesh    LIGAMENT REPAIR Left 07/2013   shoulder   LITHOTRIPSY     x2   LUMBAR LAMINECTOMY/DECOMPRESSION MICRODISCECTOMY Bilateral 12/27/2022   Procedure: Lumbar Two-Three, Lumbar Three-Four Sublaminar Decompression;  Surgeon: Donalee Citrin, MD;  Location: [facility] OR;  Service: Neurosurgery;  Laterality: Bilateral;   NASAL SEPTUM SURGERY  1975   POLYPECTOMY  04/26/2022   Procedure: POLYPECTOMY;  Surgeon: Tressia Danas, MD;  Location: [facility] ENDOSCOPY;  Service: Gastroenterology;;   POSTERIOR CERVICAL FUSION/FORAMINOTOMY N/A 11/24/2020   Procedure: Posterior Cervical Laminectomy Cervical three-four, Cervical four-five with lateral mass fusion;  Surgeon: Donalee Citrin, MD;  Location: [facility] OR;  Service: Neurosurgery;  Laterality: N/A;   REFRACTIVE SURGERY Bilateral    Right total hip replacement  4/14   SKIN CANCER  DESTRUCTION     nose, ear   TONSILLECTOMY  as child   TOTAL HIP ARTHROPLASTY Left    URETHRAL DILATION  1991   Dr. Annabell Howells   Patient Active Problem List   Diagnosis Date Noted   Spinal stenosis of lumbar region 12/27/2022   Drowsiness 12/07/2022   Preop examination 12/06/2022   Weakness of lower extremity 10/10/2022   Skin tear of right elbow without complication 09/18/2022   Skin tear of forearm without complication, left, subsequent encounter 09/18/2022   Fall 09/18/2022   Iron deficiency anemia 09/18/2022   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   Vitamin D deficiency 08/07/2022   Closed fracture of left hip (HCC) 08/07/2022   Hand weakness 05/02/2022   Acute kidney injury (HCC) 05/02/2022   Adenomatous polyp of ascending colon    Adenomatous polyp of colon    Acute metabolic encephalopathy 04/24/2022   GI bleed 04/21/2022   Vasovagal syncope    Hemorrhagic shock (HCC)    Hematochezia    Irregular heart beat 02/03/2022   Constipation 02/03/2022   Carpal tunnel syndrome 01/24/2022   Numbness of hand 01/24/2022   Ulnar neuropathy 01/24/2022   Skin lesion 01/16/2022   History of COVID-19 12/05/2021   Benign paroxysmal positional vertigo 09/14/2021   Palpitations 07/25/2021   Cardiac murmur 07/25/2021   History of chicken pox 07/22/2021   History of hemorrhoids 07/22/2021   COPD (chronic obstructive pulmonary disease) (HCC) 07/22/2021   History of kidney stones 07/22/2021   History of skin cancer 07/22/2021   Complication of anesthesia 07/22/2021   Bilateral sacroiliitis (HCC) 07/22/2021   Other abnormal glucose 07/22/2021   Spondylitis (HCC) 07/22/2021   Greater trochanteric pain syndrome 07/22/2021   Vertigo 07/08/2021   Hypokalemia 07/08/2021   Trochanteric bursitis of left hip 06/30/2021   Cancer (HCC) 04/11/2021   Lumbar radiculopathy 03/02/2021   Gait instability 03/02/2021   Hip pain 03/02/2021   Situational depression 03/02/2021   Exudative age-related  macular degeneration of right eye with active choroidal neovascularization (HCC) 02/09/2021   Urinary retention 11/26/2020   Cervical myelopathy (HCC) 11/24/2020   Body mass index (BMI) 25.0-25.9, adult 11/02/2020   Lumbar spondylosis 11/02/2020   Lumbago of lumbar region with sciatica 10/21/2020   Cystoid macular edema of both eyes 07/28/2020   Status post cervical spinal fusion 07/09/2020   History of total bilateral knee replacement 07/08/2020   Aortic atherosclerosis (HCC) 03/05/2020   Emphysema, unspecified (HCC) 03/05/2020  Arthritis 03/04/2020   Diverticulosis 03/03/2020   Internal hemorrhoids 03/03/2020   Weight loss 03/03/2020   Heme positive stool 02/29/2020   Leg cramps 11/13/2019   Tibialis anterior tendon tear, nontraumatic 11/13/2019   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 07/29/2018   CPAP (continuous positive airway pressure) dependence 03/25/2018   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   Erectile dysfunction 06/20/2017   Stenosis of right carotid artery without cerebral infarction 03/06/2017   Peripheral neuropathy 06/19/2016   Asymmetric SNHL (sensorineural hearing loss) 02/29/2016   Hearing loss 02/02/2016   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   Allergic rhinitis 02/02/2016   Atypical chest pain 11/24/2015   Preventative health care 11/24/2015   Onychomycosis 06/30/2015   Degenerative disc disease, lumbar 06/30/2015   Other allergic rhinitis 02/18/2015   Deviated nasal septum 02/18/2015   RLS (restless legs syndrome) 02/18/2015   GERD (gastroesophageal reflux disease) 09/18/2014   Hyperglycemia 03/03/2014   Rotator cuff tear 07/27/2013   Sleep apnea with use of continuous positive airway pressure (CPAP) 07/15/2013   Carotid stenosis 02/24/2013   Nonspecific abnormal electrocardiogram (ECG) (EKG) 01/06/2013   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   Lower GI bleed 12/12/2012   Right groin pain 11/29/2012   OSA (obstructive sleep apnea) 11/25/2012    Ventral hernia 07/08/2012   High risk medication use 01/18/2012   Dyspnea 06/12/2011   Hyperlipidemia 12/27/2010   Osteoarthrosis, unspecified whether generalized or localized, pelvic region and thigh 07/25/2010   Osteoarthrosis, hand 06/13/2010   Anemia 12/17/2009   Hypothyroidism 05/13/2009   GOUT 05/13/2009   Right inguinal hernia 05/13/2009   HIATAL HERNIA 05/13/2009   NEPHROLITHIASIS 05/13/2009   ROSACEA 05/13/2009   Hypertension 05/13/2009    PCP: [name], NP   REFERRING PROVIDER: Susa Raring., MD   REFERRING DIAG:  M25.562, G89.29 (ICD-10-CM) - Chronic pain in left knee  M25.561, G89.29 (ICD-10-CM) - Chronic pain in right knee   THERAPY DIAG:  Chronic pain of both knees  Muscle weakness (generalized)  Unsteadiness on feet  Other abnormalities of gait and mobility  Repeated falls  Other low back pain  RATIONALE FOR EVALUATION AND TREATMENT: Rehabilitation  ONSET DATE: off and on for a couple years  NEXT MD VISIT: none scheduled   SUBJECTIVE:                                                                                                                                                                                                         SUBJECTIVE STATEMENT: Pt reports having that LBP in the am when  waking up, as the day goes on the pain decreases.  PAIN: Are you having pain? No - B knees  Are you having pain? No - low back   PERTINENT HISTORY:  L2-3, L3-4 Sublaminar Decompression on 12/27/22; Revision of femoral component of L THA on 07/02/22 s/p fall with periprosthetic fracture; Aortic atherosclerosis; carotid stenosis; cervical myelopathy s/p ACDF 06/2020 and posterior cervical fusion 11/2020; peripheral neuropathy; lumbar DDD/spondylosis; lumbago with sciatica/lumbar radiculopathy; OA; B TKA; B THA; L greater trochanteric pain syndrome; L RCR 2014; CTR; hearing loss; HTN; BPPV/vertigo; B sacroiliitis; skin cancer; COPD/emphysema; sleep  apnea with CPAP; GERD; gout; HLD; LE cramps; RLS   PRECAUTIONS: Fall  WEIGHT BEARING RESTRICTIONS: No  FALLS:  Has patient fallen in last 6 months? Yes. 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  No fracture or bony lesion or other findings.  Impression: Right knee replacement in good condition.   01/31/23 - L knee x-ray: No fracture or bony lesion or arthritic change or other abnormality.  Left  knee replacement looks well-fixed and in excellent position.  Impression: Knee replacement in good condition.   PATIENT SURVEYS:  LEFS 31 / 80 = 38.8 % 05/10/23: 32 / 80 = 40.0 %  COGNITION: Overall cognitive status: Within functional limits for tasks assessed and History of cognitive impairments - at baseline    SENSATION: Intermittent peripheral neuropathy in B feet  MUSCLE LENGTH: TBA Hamstrings:  ITB:  Piriformis:  Hip flexors:  Quads:  Heelcord:   POSTURE:  rounded shoulders, forward head, and flexed trunk   LOWER EXTREMITY ROM: Limited B hip and ankle ROM Active ROM Right eval Left eval Right 04/03/23  Left 04/03/23 R/L 05/28/23  Knee flexion 104 96 110 108   Knee extension -10 -5 4 2 4  in LAQ/ 1 in LAQ   Passive ROM Right eval Left eval  Knee flexion    Knee extension 0 0  (Blank rows = not tested)  LOWER EXTREMITY MMT:  MMT Right Eval* Left   Eval* R * 05/10/23 L * 05/10/23 R 06/21/23 L 06/21/23  Hip flexion 3+ 4 3+ 4- 4- 4  Hip extension 3- 3+ 3+ 3+ 4 4+  Hip abduction 4- 4 4 4 4 4   Hip adduction 4 4 4 4 4  4+  Hip internal rotation 3+ 4- 3+ 4-    Hip external rotation 2+ 3- 2+ 3-    Knee flexion 4 4 4+ 4+    Knee extension 4 4+ 4 4 4  4+  Ankle dorsiflexion 4+ 4 4+ 4-    Ankle plantarflexion  Ankle inversion        Ankle eversion         (Blank rows = not tested, *tested in sitting)  FUNCTIONAL TESTS: (Remaining tests to be assessed next visit with PT) 5 times sit to stand: 18.94 sec w/o UE assist (uncontrolled descent on most reps) (03/22/23) Timed up and go (TUG): 21.29 sec with RW (03/22/23) 10 meter walk test: 21.13 sec with RW; Gait speed = 1.55 ft/sec  Berg Balance Scale: 35/56; <36 = High risk for falls (close to 100%) (03/22/23) Dynamic Gait Index: 11/24; Scores of 19 or less are predictive of falls in older community living adults (03/22/23)  04/17/23: TUG: 20 sec  5xSTS: 18 sec  05/10/23: 5xSTS: 13.31 sec w/o UE assist TUG: 21.06 with RW, difficulty with turning : 14.25 sec with RW Gait speed: 2.30 ft/sec with RW DGI: 13/24  05/17/23: Sharlene Motts: 32/56  06/26/23:  Sharlene Motts: 38/56  GAIT: Distance walked: 60 ft Assistive device utilized: Environmental consultant - 2 wheeled Level of assistance: SBA Gait pattern: step through pattern, decreased stride length, decreased hip/knee flexion- Right, decreased hip/knee flexion- Left, decreased ankle dorsiflexion- Right, decreased ankle dorsiflexion- Left, trunk flexed, poor foot clearance- Right, and poor foot clearance- Left Comments: Cues necessary for improved upright posture and rolling walker proximity   TODAY'S TREATMENT:    06/26/23 THERAPEUTIC EXERCISE: to improve flexibility, strength and mobility.  Demonstration, verbal and tactile cues throughout for technique. NuStep - L6 x 7 min  Therapeutic Activity: to assess functional performance. BERG: 38/56 Gait for 400' with RW - increased L hip flexion during swing phase, halfway through decreased overall gait speed and decreased stride length Consult with patient about progress and outlook on remainder of PT   06/21/23 THERAPEUTIC EXERCISE: to improve flexibility, strength and mobility.  Demonstration, verbal and tactile cues throughout for technique. NuStep - L6 x 6 min LE MMT Seated LAQ 3# x 10 bil Seated marching 3# 2 x 10 bil Standing hip abduction x 10 2# Standing hip extension x 10 2# Standing HS curls x 10 2# GAIT TRAINING: To normalize gait pattern and improve safety. With RW - cues for upright posture and heel strike, foot clearance   06/19/23 THERAPEUTIC EXERCISE: to improve flexibility, strength and mobility.  Demonstration, verbal and tactile cues throughout for technique. NuStep - L6 x 7 min R/L step over pool noodle fwd x 10; lateral x 10  Church pews R/L x 10 intermittent UE support Seated with narrow BOS: -Side to side with blue weighted ball x 10  -Basketball chest press with blue weight ball from each side x 10 -Im a winner with blue weight ball to each side x 10   PATIENT EDUCATION:  Education details: Gaffer with RW   Person educated: Patient Education method: Explanation, Demonstration, and Verbal cues Education comprehension: verbalized understanding, verbal cues required, and needs further education  HOME EXERCISE PROGRAM: Access Code: [email or web address] URL: [email or web address] Date: 06/21/2023 Prepared by: Verta Ellen  Exercises - Seated Hamstring Stretch with Strap  - 1 x daily - 7 x weekly - 3 sets - 30 sec hold - Seated Quad Set  - 1 x daily - 7 x weekly - 3 sets - 10 reps - 5 seconds hold -  Seated Hip Adduction Squeeze with Ball  - 1 x daily - 7 x weekly - 3 sets - 10 reps - 5 secs hold - Supine Quad Set on Towel Roll  - 1 x daily - 7 x weekly - 2  sets - 10 reps - 3 sec hold - Hooklying Single Leg Bent Knee Fallouts with Resistance  - 1 x daily - 7 x weekly - 2 sets - 10 reps - 3 sec hold - Supine Bridge with Resistance Band  - 1 x daily - 7 x weekly - 2 sets - 10 reps - 5 sec hold - Seated Long Arc Quad  - 1 x daily - 7 x weekly - 3 sets - 10 reps - Standing Gastroc Stretch at Counter  - 1 x daily - 7 x weekly - 3 sets - 3 reps - 30 sec hold - Seated Shoulder Horizontal Abduction with Resistance - Palms Down  - 1 x daily - 7 x weekly - 3 sets - 10 reps - Seated Anti-Rotation Press With Anchored Resistance  - 1 x daily - 7 x weekly - 3 sets - 10 reps - Standing Hip Abduction with Counter Support  - 1 x daily - 3 x weekly - 2 sets - 10 reps - Standing Hip Extension with Counter Support  - 1 x daily - 3 x weekly - 2 sets - 10 reps - Standing Knee Flexion with Counter Support  - 1 x daily - 3 x weekly - 2 sets - 10 reps - Seated Long Arc Quad  - 1 x daily - 3 x weekly - 2 sets - 10 reps - Seated March  - 1 x daily - 3 x weekly - 2 sets - 10 reps   ASSESSMENT:  CLINICAL IMPRESSION: [name] Pratt demonstrates improved score on BERG balance scale by 6 points today, with a score of 38/56 still placing him in moderate fall risk category. He required a few seated rest breaks with BERG test. He is able to ambulate for 400' with a RW but did notice decreased stride length and gait speed towards the end. He also demonstrates increased L hip flexion during swing phase to compensate for foot drop. Pt continues to show need for continued therapy to improve standing balance for functional activities at home as well as LE strength to improve stability with ambulation.   OBJECTIVE IMPAIRMENTS: Abnormal gait, decreased activity tolerance, decreased balance, decreased coordination, decreased endurance,  decreased knowledge of condition, decreased knowledge of use of DME, decreased mobility, difficulty walking, decreased ROM, decreased strength, decreased safety awareness, increased fascial restrictions, impaired perceived functional ability, increased muscle spasms, impaired flexibility, impaired sensation, improper body mechanics, postural dysfunction, and pain.   ACTIVITY LIMITATIONS: carrying, lifting, bending, sitting, standing, squatting, sleeping, stairs, transfers, bed mobility, bathing, locomotion level, and caring for others  PARTICIPATION LIMITATIONS: meal prep, cleaning, laundry, driving, shopping, and community activity  PERSONAL FACTORS: Age, Behavior pattern, Fitness, Past/current experiences, Time since onset of injury/illness/exacerbation, and 3+ comorbidities: L2-3, L3-4 Sublaminar Decompression on 12/27/22; Revision of femoral component of L THA on 07/02/22 s/p fall with periprosthetic fracture; Aortic atherosclerosis; carotid stenosis; 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   REHAB POTENTIAL: Good  CLINICAL DECISION MAKING: Evolving/moderate complexity  EVALUATION COMPLEXITY: Moderate   GOALS: Goals reviewed with patient? Yes  SHORT TERM GOALS: Target date: 04/10/2023, extended to 06/07/2023  Patient will be independent with initial HEP. Baseline:  Goal status: MET  2.  Complete balance testing and establish additional STG's/LTG's as appropriate. Baseline:  Goal status: MET  03/22/23  3.  Patient will improve 5x STS time to </= 16 seconds with good eccentric control for improved efficiency and  safety with transfers Baseline: 18.94 sec w/o UE assist (uncontrolled descent on most reps) Goal status: MET  05/10/23 - 13.31 sec w/o UE assist with good stability upon standing and improved eccentric control  4.  Patient will demonstrate decreased TUG time to </= 18 sec to decrease risk for falls with transitional mobility Baseline: 21.29 sec with RW; 04/17/23 - 20 sec with RW Goal status: MET 06/14/23 - 17.21 sec  LONG TERM GOALS: Target date: 05/08/2023, extended to 07/05/2023  Patient will be independent with advanced/ongoing HEP to improve outcomes and carryover.  Baseline:  Goal status: IN PROGRESS  05/10/23 - pt reports difficulty using TB due to inability to get the band over his legs and has been using his hands for manual resistance  2.  Patient will report at least 50-75% improvement in B knee pain with no further instances of subluxation to improve QOL. Baseline: 5/10 Goal status: MET  05/10/23 - no recent knee pain reported  3.  Patient will demonstrate improved B knee extension AROM to [facility] to allow for improved stability for normal gait and stair mechanics. Baseline: Refer to above LE ROM table Goal status: IN PROGRESS  05/28/23 - refer to ROM table  4.  Patient will demonstrate improved B LE strength to >/= 4 to 4+/5 for improved stability and ease of mobility. Baseline: Refer to above LE MMT table Goal status: IN PROGRESS  06/26/23 - see chart  5.  Patient will be able to ambulate 400' with LRAD and normal gait pattern without increased pain to access community.  Baseline:  Goal status: IN PROGRESS  06/26/23 - 400' with RW and frequent cues for upright posture and improved RW proximity'; increased L hip flexion during swing phase, halfway through decreased overall gait speed and decreased stride length  6. Patient will be able to ascend/descend stairs with 1 HR and reciprocal step pattern safely to access home and community.  Baseline:  Goal status: IN PROGRESS  05/10/23 -  able to complete reciprocal ascent with left HR and R HHA/CGA of PT (reports he uses cane for other hand at home), step to pattern on descent with continued need for R HHA/CGA PT  7.  Patient will report >/= 40/80 on LEFS to demonstrate improved functional ability. Baseline: 31 / 80 = 38.8 % Goal status: IN PROGRESS  05/10/23 - 32 / 80 = 40.0 %  8.  Patient will demonstrate at least 19/24 on DGI to decrease risk of falls. Baseline: 11/24 (03/22/23) Goal status: IN PROGRESS  05/10/23 - 13/24  9.  Patient will improve Berg score by at least 8 points to improve safety and stability with ADLs in standing and reduce risk for falls. Baseline: 35/56 (03/22/23); 05/17/23 - 32/56 Goal status: IN PROGRESS  06/26/23 - 38/56   PLAN:  PT FREQUENCY: 2x/week  PT DURATION: 6-8 weeks  PLANNED INTERVENTIONS: Therapeutic exercises, Therapeutic activity, Neuromuscular re-education, Balance training, Gait training, Patient/Family education, Self Care, Joint mobilization, Stair training, DME instructions, Aquatic Therapy, Dry Needling, Electrical stimulation, Spinal mobilization, Cryotherapy, Moist heat, Taping, Ultrasound, and Manual therapy  PLAN FOR NEXT SESSION: LEFS; 10MWT/gait speed; DGI; core/lumbopelvic/B LE flexibility and strengthening; balance training; review and update/progress HEP as indicated - look into alternative options for Thera-Band vs other  types of resistance as pt reports difficulty donning band around legs; if forward flexed posture with gait persists, consider transition to standard RW   Darleene Cleaver, PTA 06/26/2023, 2:33 PM   [name] Mink "[name] Pratt" is currently demonstrating progress with PT with improving balance as evidenced by 6 point improvement on Berg following recent decline in score last month related to increased falls, as well as improving LE strength per recent MMT.  He continues to demonstrate difficulty with ambulation due to L foot drop which he compensates for with  exaggerated L hip flexion, as well as ongoing forward flexed posture with decreased RW proximity.  [name] Pratt will benefit from continued skilled PT pending further assessment of progress towards goals to address ongoing deficits including improving standing balance for functional activities at home as well as LE strength to improve stability with ambulation to decrease risk for future falls.  Marry Guan, PT 06/26/23, 5:16 PM  [facility] [address]  [address] [place], [place], [address] Phone: [phone]   Fax:  [phone]

## 2023-06-28 ENCOUNTER — Ambulatory Visit: Payer: Medicare HMO | Admitting: Physical Therapy

## 2023-06-28 ENCOUNTER — Encounter: Payer: Self-pay | Admitting: Physical Therapy

## 2023-06-28 DIAGNOSIS — R296 Repeated falls: Secondary | ICD-10-CM | POA: Diagnosis not present

## 2023-06-28 DIAGNOSIS — G8929 Other chronic pain: Secondary | ICD-10-CM

## 2023-06-28 DIAGNOSIS — M5459 Other low back pain: Secondary | ICD-10-CM | POA: Diagnosis not present

## 2023-06-28 DIAGNOSIS — M25562 Pain in left knee: Secondary | ICD-10-CM | POA: Diagnosis not present

## 2023-06-28 DIAGNOSIS — M6281 Muscle weakness (generalized): Secondary | ICD-10-CM | POA: Diagnosis not present

## 2023-06-28 DIAGNOSIS — R2681 Unsteadiness on feet: Secondary | ICD-10-CM

## 2023-06-28 DIAGNOSIS — R2689 Other abnormalities of gait and mobility: Secondary | ICD-10-CM | POA: Diagnosis not present

## 2023-06-28 DIAGNOSIS — M25561 Pain in right knee: Secondary | ICD-10-CM | POA: Diagnosis not present

## 2023-06-28 NOTE — Therapy (Signed)
OUTPATIENT PHYSICAL THERAPY TREATMENT / RECERTIFICATION  Progress Note  Reporting Period 05/10/2023 to 06/28/2023   See note below for Objective Data and Assessment of Progress/Goals.     Patient Name: [name] Pratt MRN: [id number] DOB:1940/12/14, 82 y.o., male Today's Date: 06/28/2023   END OF SESSION:  PT End of Session - 06/28/23 1530     Visit Number 21    Date for PT Re-Evaluation 07/26/23    Authorization Type [name]    Authorization Time Period 05/10/23 - 07/05/23    Authorization - Visit Number 12    Authorization - Number of Visits 12    Progress Note Due on Visit 30    PT Start Time 1530    PT Stop Time 1624    PT Time Calculation (min) 54 min    Activity Tolerance Patient tolerated treatment well    Behavior During Therapy [facility] for tasks assessed/performed                   Past Medical History:  Diagnosis Date   Allergic rhinitis 02/02/2016   Anemia 12/17/2009   Formatting of this note might be different from the original. 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 Hypertension   Benign paroxysmal positional vertigo 09/14/2021   Bilateral sacroiliitis (HCC) 07/22/2021   Body mass index (BMI) 25.0-25.9, adult 11/02/2020   Cancer (HCC)    skin cancer   Cardiac murmur 07/25/2021   Carotid stenosis    a. Carotid U/S 5/13: LICA < 50%, RICA 50-69%;  b.  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   Cystoid macular edema of both eyes 07/28/2020   Degenerative disc disease, lumbar 06/30/2015   Deviated nasal septum 02/18/2015   Diverticulosis 03/03/2020   DJD (degenerative joint disease) of hip 12/24/2012   Dyspnea 06/12/2011   Emphysema, unspecified (HCC) 03/05/2020   Erectile dysfunction 06/20/2017   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   Gait disorder 03/02/2021   GERD (gastroesophageal reflux disease)    GOUT 05/13/2009   Qualifier: Diagnosis of  By: Kem Parkinson     Greater trochanteric pain syndrome 07/22/2021   Hand pain, left 01/16/2022   Hearing loss 02/02/2016   Heme positive stool 02/29/2020   HIATAL HERNIA 05/13/2009   Qualifier: Diagnosis of  By: Kem Parkinson     High risk medication use 01/18/2012   Hip pain 03/02/2021   History of chicken pox    History of COVID-19 12/05/2021   History of hemorrhoids    History of kidney stones    History of skin cancer 07/22/2021   skin cancer   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  Cardiology - Dr Estrella Myrtle at [facility] cardiology  ICD-10 cut over   Hypertension    under control   Hypokalemia 07/08/2021   Hypothyroidism    Internal hemorrhoids 03/03/2020   Leg cramps 11/13/2019   Lower GI bleed 12/12/2012   Lumbago of lumbar region with sciatica 10/21/2020   Lumbar radiculopathy 03/02/2021   Lumbar spondylosis 11/02/2020   NEPHROLITHIASIS 05/13/2009   Qualifier: Diagnosis of  By: Kem Parkinson     Nonspecific abnormal electrocardiogram (ECG) (EKG) 01/06/2013   Numbness of hand 01/24/2022   Onychomycosis 06/30/2015   Osteoarthrosis, hand 06/13/2010   Formatting of this note might be  different from the original. Osteoarthritis Of The Hand  10/1 IMO update   Osteoarthrosis, unspecified whether generalized or localized, pelvic region and thigh 07/25/2010   Formatting of this note might be different from the original. Osteoarthritis Of The Hip   Other abnormal glucose 07/22/2021   Other allergic rhinitis 02/18/2015   Palpitations 07/25/2021   Peripheral neuropathy 06/19/2016   Preventative health care 11/24/2015   Right groin pain 11/29/2012   Right inguinal hernia 05/13/2009   Qualifier: Diagnosis of  By: Kem Parkinson     RLS (restless legs syndrome) 02/18/2015   Rosacea    Rotator cuff tear 07/27/2013   Situational depression 03/02/2021   Skin lesion 01/16/2022   Sleep apnea with use of continuous positive airway pressure (CPAP) 07/15/2013   CPAP set to  7 cm water,  Residual AHi was 7.8  With no leak.  User time 6 hours.  479 days , not used only 12 days - highly compliant 06-23-13  .    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 Fawn Kirk   Ulnar neuropathy 01/24/2022   Urinary retention 11/26/2020   Ventral hernia 07/08/2012   Vertigo 07/08/2021   Weight loss 03/03/2020   Past Surgical History:  Procedure Laterality Date   ABDOMINAL HERNIA REPAIR  07/15/12   Dr Ashley Jacobs   ANTERIOR CERVICAL DECOMP/DISCECTOMY FUSION N/A 07/09/2020   Procedure: ANTERIOR CERVICAL DECOMPRESSION AND FUSION CERVICAL THREE-FOUR.;  Surgeon: Donalee Citrin, MD;  Location: [facility] OR;  Service: Neurosurgery;  Laterality: N/A;  anterior   BROW LIFT  05/07/01   COLONOSCOPY WITH PROPOFOL N/A 04/24/2022   Procedure: COLONOSCOPY WITH PROPOFOL;  Surgeon: Tressia Danas, MD;  Location: [facility] ENDOSCOPY;  Service:  Gastroenterology;  Laterality: N/A;   COLONOSCOPY WITH PROPOFOL N/A 04/26/2022   Procedure: COLONOSCOPY WITH PROPOFOL;  Surgeon: Tressia Danas, MD;  Location: [facility] ENDOSCOPY;  Service: Gastroenterology;  Laterality: N/A;   CYSTOSCOPY WITH URETEROSCOPY AND STENT PLACEMENT Right 01/28/2014   Procedure: CYSTOSCOPY WITH URETEROSCOPY, BASKET RETRIVAL AND  STENT PLACEMENT;  Surgeon: Valetta Fuller, MD;  Location: [facility] ORS;  Service: Urology;  Laterality: Right;   epidural injections     multiple procedures   ESOPHAGEAL DILATION  1993 and 1994   multiple times   EYE SURGERY Bilateral 03/25/10, 2012   cataract removal   HEMORRHOID SURGERY  2014   HIATAL HERNIA REPAIR  12/21/93   HOLMIUM LASER APPLICATION Right 01/28/2014   Procedure: HOLMIUM LASER APPLICATION;  Surgeon: Valetta Fuller, MD;  Location: [facility] ORS;  Service: Urology;  Laterality: Right;   INGUINAL HERNIA REPAIR Right 07/15/12  Dr Ashley Jacobs, x2   JOINT REPLACEMENT  07/25/04   right knee   JOINT REPLACEMENT  07/13/10   left knee   KNEE SURGERY  1995   LAPAROSCOPIC CHOLECYSTECTOMY  09/20/06   with intraoperative cholangiogram and right inguinal herniorrhaphy with mesh    LIGAMENT REPAIR Left 07/2013   shoulder   LITHOTRIPSY     x2   LUMBAR LAMINECTOMY/DECOMPRESSION MICRODISCECTOMY Bilateral 12/27/2022   Procedure: Lumbar Two-Three, Lumbar Three-Four Sublaminar Decompression;  Surgeon: Donalee Citrin, MD;  Location: [facility] OR;  Service: Neurosurgery;  Laterality: Bilateral;   NASAL SEPTUM SURGERY  1975   POLYPECTOMY  04/26/2022   Procedure: POLYPECTOMY;  Surgeon: Tressia Danas, MD;  Location: [facility] ENDOSCOPY;  Service: Gastroenterology;;   POSTERIOR CERVICAL FUSION/FORAMINOTOMY N/A 11/24/2020   Procedure: Posterior Cervical Laminectomy Cervical three-four, Cervical four-five with lateral mass fusion;  Surgeon: Donalee Citrin, MD;  Location: [facility] OR;  Service: Neurosurgery;  Laterality: N/A;   REFRACTIVE SURGERY Bilateral    Right total hip replacement  4/14    SKIN CANCER DESTRUCTION     nose, ear   TONSILLECTOMY  as child   TOTAL HIP ARTHROPLASTY Left    URETHRAL DILATION  1991   Dr. Annabell Howells   Patient Active Problem List   Diagnosis Date Noted   Spinal stenosis of lumbar region 12/27/2022   Drowsiness 12/07/2022   Preop examination 12/06/2022   Weakness of lower extremity 10/10/2022   Skin tear of right elbow without complication 09/18/2022   Skin tear of forearm without complication, left, subsequent encounter 09/18/2022   Fall 09/18/2022   Iron deficiency anemia 09/18/2022   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   Vitamin D deficiency 08/07/2022   Closed fracture of left hip (HCC) 08/07/2022   Hand weakness 05/02/2022   Acute kidney injury (HCC) 05/02/2022   Adenomatous polyp of ascending colon    Adenomatous polyp of colon    Acute metabolic encephalopathy 04/24/2022   GI bleed 04/21/2022   Vasovagal syncope    Hemorrhagic shock (HCC)    Hematochezia    Irregular heart beat 02/03/2022   Constipation 02/03/2022   Carpal tunnel syndrome 01/24/2022   Numbness of hand 01/24/2022   Ulnar neuropathy 01/24/2022   Skin lesion 01/16/2022   History of COVID-19 12/05/2021   Benign paroxysmal positional vertigo 09/14/2021   Palpitations 07/25/2021   Cardiac murmur 07/25/2021   History of chicken pox 07/22/2021   History of hemorrhoids 07/22/2021   COPD (chronic obstructive pulmonary disease) (HCC) 07/22/2021   History of kidney stones 07/22/2021   History of skin cancer 07/22/2021   Complication of anesthesia 07/22/2021   Bilateral sacroiliitis (HCC) 07/22/2021   Other abnormal glucose 07/22/2021   Spondylitis (HCC) 07/22/2021   Greater trochanteric pain syndrome 07/22/2021   Vertigo 07/08/2021   Hypokalemia 07/08/2021   Trochanteric bursitis of left hip 06/30/2021   Cancer (HCC) 04/11/2021   Lumbar radiculopathy 03/02/2021   Gait instability 03/02/2021   Hip pain 03/02/2021   Situational depression 03/02/2021   Exudative  age-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 (HCC) 02/09/2021   Urinary retention 11/26/2020   Cervical myelopathy (HCC) 11/24/2020   Body mass index (BMI) 25.0-25.9, adult 11/02/2020   Lumbar spondylosis 11/02/2020   Lumbago of lumbar region with sciatica 10/21/2020   Cystoid macular edema of both eyes 07/28/2020   Status post cervical spinal fusion 07/09/2020   History of total bilateral knee replacement 07/08/2020   Aortic atherosclerosis (HCC) 03/05/2020   Emphysema, unspecified (HCC) 03/05/2020  Arthritis 03/04/2020   Diverticulosis 03/03/2020   Internal hemorrhoids 03/03/2020   Weight loss 03/03/2020   Heme positive stool 02/29/2020   Leg cramps 11/13/2019   Tibialis anterior tendon tear, nontraumatic 11/13/2019   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 07/29/2018   CPAP (continuous positive airway pressure) dependence 03/25/2018   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   Erectile dysfunction 06/20/2017   Stenosis of right carotid 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 03/06/2017   Peripheral neuropathy 06/19/2016   Asymmetric SNHL (sensorineural hearing loss) 02/29/2016   Hearing loss 02/02/2016   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   Allergic rhinitis 02/02/2016   Atypical chest pain 11/24/2015   Preventative health care 11/24/2015   Onychomycosis 06/30/2015   Degenerative disc disease, lumbar 06/30/2015   Other allergic rhinitis 02/18/2015   Deviated nasal septum 02/18/2015   RLS (restless legs syndrome) 02/18/2015   GERD (gastroesophageal reflux disease) 09/18/2014   Hyperglycemia 03/03/2014   Rotator cuff tear 07/27/2013   Sleep apnea with use of continuous positive airway pressure (CPAP) 07/15/2013   Carotid stenosis 02/24/2013   Nonspecific abnormal electrocardiogram (ECG) (EKG) 01/06/2013   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   Lower GI bleed 12/12/2012   Right groin pain 11/29/2012   OSA (obstructive sleep apnea)  11/25/2012   Ventral hernia 07/08/2012   High risk medication use 01/18/2012   Dyspnea 06/12/2011   Hyperlipidemia 12/27/2010   Osteoarthrosis, unspecified whether generalized or localized, pelvic region and thigh 07/25/2010   Osteoarthrosis, hand 06/13/2010   Anemia 12/17/2009   Hypothyroidism 05/13/2009   GOUT 05/13/2009   Right inguinal hernia 05/13/2009   HIATAL HERNIA 05/13/2009   NEPHROLITHIASIS 05/13/2009   ROSACEA 05/13/2009   Hypertension 05/13/2009    PCP: [name], NP   REFERRING PROVIDER: Susa Raring., MD   REFERRING DIAG:  M25.562, G89.29 (ICD-10-CM) - Chronic pain in left knee  M25.561, G89.29 (ICD-10-CM) - Chronic pain in right knee   THERAPY DIAG:  Chronic pain of both knees  Muscle weakness (generalized)  Unsteadiness on feet  Other abnormalities of gait and mobility  Repeated falls  Other low back pain  RATIONALE FOR EVALUATION AND TREATMENT: Rehabilitation  ONSET DATE: off and on for a couple years  NEXT MD VISIT: none scheduled   SUBJECTIVE:                                                                                                                                                                                                         SUBJECTIVE STATEMENT: Pt reports he feels like he is better able  to catch his balance than he used to. No recent falls. As for knees, the R knee is usually the one that gives him trouble - usually aches, but denies pain today.  PAIN: Are you having pain? No - B knees  Are you having pain? No - low back   PERTINENT HISTORY:  L2-3, L3-4 Sublaminar Decompression on 12/27/22; Revision of femoral component of L THA on 07/02/22 s/p fall with periprosthetic fracture; Aortic atherosclerosis; carotid stenosis; cervical myelopathy s/p ACDF 06/2020 and posterior cervical fusion 11/2020; peripheral neuropathy; lumbar DDD/spondylosis; lumbago with sciatica/lumbar radiculopathy; OA; B TKA; B THA; L greater  trochanteric pain syndrome; L RCR 2014; CTR; hearing loss; HTN; BPPV/vertigo; B sacroiliitis; skin cancer; COPD/emphysema; sleep apnea with CPAP; GERD; gout; HLD; LE cramps; RLS   PRECAUTIONS: Fall  WEIGHT BEARING RESTRICTIONS: No  FALLS:  Has patient fallen in last 6 months? Yes. 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  No fracture or bony lesion or other findings.  Impression: Right knee replacement in good condition.   01/31/23 - L knee x-ray: No fracture or bony lesion or arthritic change or other abnormality.  Left  knee replacement looks well-fixed and in excellent position.  Impression: Knee replacement in good condition.   PATIENT SURVEYS:  LEFS 31 / 80 = 38.8 % 05/10/23: 32 / 80 = 40.0 %  COGNITION: Overall cognitive status: Within functional limits for tasks assessed and History of cognitive impairments - at baseline    SENSATION: Intermittent peripheral neuropathy in B feet  MUSCLE LENGTH: TBA Hamstrings:  ITB:  Piriformis:  Hip flexors:  Quads:  Heelcord:   POSTURE:  rounded shoulders, forward  head, and flexed trunk   LOWER EXTREMITY ROM: Limited B hip and ankle ROM Active ROM Right eval Left eval Right 04/03/23 Left 04/03/23 R/L 05/28/23 R/L 06/28/23  Knee flexion 104 96 110 108  108/108  Knee extension -10 -5 -4 -2 4 in LAQ/ 1 in LAQ -4/-2 in LAQ 0/0 supported   Passive ROM Right eval Left eval  Knee flexion    Knee extension 0 0  (Blank rows = not tested)  LOWER EXTREMITY MMT:  MMT Right Eval* Left   Eval* R * 05/10/23 L * 05/10/23 R 06/21/23 L 06/21/23  Hip flexion 3+ 4 3+ 4- 4- 4  Hip extension 3- 3+ 3+ 3+ 4 4+  Hip abduction 4- 4 4 4 4 4   Hip adduction 4 4 4 4 4  4+  Hip internal rotation 3+ 4- 3+ 4-    Hip external rotation 2+ 3- 2+ 3-  Knee flexion 4 4 4+ 4+    Knee extension 4 4+ 4 4 4  4+  Ankle dorsiflexion 4+ 4 4+ 4-    Ankle plantarflexion        Ankle inversion        Ankle eversion         (Blank rows = not tested, *tested in sitting)  FUNCTIONAL TESTS: (Remaining tests to be assessed next visit with PT) 5 times sit to stand: 18.94 sec w/o UE assist (uncontrolled descent on most reps) (03/22/23) Timed up and go (TUG): 21.29 sec with RW (03/22/23) 10 meter walk test: 21.13 sec with RW; Gait speed = 1.55 ft/sec  Berg Balance Scale: 35/56; <36 = High risk for falls (close to 100%) (03/22/23) Dynamic Gait Index: 11/24; Scores of 19 or less are predictive of falls in older community living adults (03/22/23)  04/17/23: TUG: 20 sec  5xSTS: 18 sec  05/10/23: 5xSTS: 13.31 sec w/o UE assist TUG: 21.06 with RW, difficulty with turning : 14.25 sec with RW Gait speed: 2.30 ft/sec with RW DGI: 13/24  05/17/23: Sharlene Motts: 32/56  10/15/24Sharlene Motts: 38/56  06/28/23: : 12.72 sec with RW Gait speed = 2.58 ft/sec with RW  GAIT: Distance walked: 60 ft Assistive device utilized: Environmental consultant - 2 wheeled Level of assistance: SBA Gait pattern: step through pattern, decreased stride length, decreased hip/knee flexion- Right, decreased hip/knee flexion- Left,  decreased ankle dorsiflexion- Right, decreased ankle dorsiflexion- Left, trunk flexed, poor foot clearance- Right, and poor foot clearance- Left Comments: Cues necessary for improved upright posture and rolling walker proximity   TODAY'S TREATMENT:   06/28/23 - Recert THERAPEUTIC EXERCISE: to improve flexibility, strength and mobility.  Demonstration, verbal and tactile cues throughout for technique. NuStep - L6 x 6 min Seated GTB clam x 10 (using band with looped "handles" on either wrapped around legs and crossed over on top as pt has difficulty placing looped band around his legs) - replacing hooklying bent-knee fallout in HEP Seated eccentric squat touches to chair with counter support x 10 - replacing bridge in HEP as bed too soft   THERAPEUTIC ACTIVITIES: : 12.72 sec with RW Gait speed = 2.58 ft/sec with RW DGI: 14/24 Knee ROM Goal assessment Stairs: Level of Assistance: CGA Stair Negotiation Technique: Step to Pattern/Alternating Pattern, Forwards with Single Rail on Right Number of Stairs: 14  Height of Stairs: 7"  Comments: able to complete reciprocal ascent with right HR but fatigues w/o L UE support  (reports he uses cane for other hand at home), step to pattern on descent with continued need for CGA of PT   06/26/23 - PN THERAPEUTIC EXERCISE: to improve flexibility, strength and mobility.  Demonstration, verbal and tactile cues throughout for technique. NuStep - L6 x 7 min  Therapeutic Activity: to assess functional performance. BERG: 38/56 Gait for 400' with RW - increased L hip flexion during swing phase, halfway through decreased overall gait speed and decreased stride length Consult with patient about progress and outlook on remainder of PT   06/21/23 THERAPEUTIC EXERCISE: to improve flexibility, strength and mobility.  Demonstration, verbal and tactile cues throughout for technique. NuStep - L6 x 6 min LE MMT Seated LAQ 3# x 10 bil Seated marching 3# 2 x  10 bil Standing hip abduction x 10 2# Standing hip extension x 10 2# Standing HS curls x 10 2# GAIT TRAINING: To normalize gait pattern and improve safety. With RW - cues for upright posture and heel strike, foot clearance  PATIENT EDUCATION:  Education details: progress with PT, ongoing PT POC, HEP review, HEP modification - refer to today's treatment note, and gait safety with RW   Person educated: Patient Education method: Explanation, Demonstration, Verbal cues, and Handouts Education comprehension: verbalized understanding, returned demonstration, verbal cues required, and needs further education  HOME EXERCISE PROGRAM: Access Code: [email or web address] URL: [email or web address] Date: 06/28/2023 Prepared by: Glenetta Hew  Exercises - Seated Hamstring Stretch with Strap  - 1 x daily - 7 x weekly - 3 sets - 30 sec hold - Seated Quad Set  - 1 x daily - 7 x weekly - 3 sets - 10 reps - 5 seconds hold - Seated Hip Adduction Squeeze with Ball  - 1 x daily - 7 x weekly - 3 sets - 10 reps - 5 secs hold - Supine Quad Set on Towel Roll  - 1 x daily - 7 x weekly - 2 sets - 10 reps - 3 sec hold - Standing Gastroc Stretch at Counter  - 1 x daily - 7 x weekly - 3 sets - 3 reps - 30 sec hold - Seated Shoulder Horizontal Abduction with Resistance - Palms Down  - 1 x daily - 7 x weekly - 3 sets - 10 reps - Seated Anti-Rotation Press With Anchored Resistance  - 1 x daily - 7 x weekly - 3 sets - 10 reps - Standing Hip Abduction with Counter Support  - 1 x daily - 3 x weekly - 2 sets - 10 reps - Standing Hip Extension with Counter Support  - 1 x daily - 3 x weekly - 2 sets - 10 reps - Standing Knee Flexion with Counter Support  - 1 x daily - 3 x weekly - 2 sets - 10 reps - Seated Long Arc Quad  - 1 x daily - 3 x weekly - 2 sets - 10 reps - Seated March  - 1 x daily - 3 x weekly - 2 sets - 10 reps - Seated Hip Abduction with Resistance  - 1 x daily - 3 x weekly - 2 sets - 10 reps - 3-5 sec  hold - Squat with Chair and Counter Support  - 1 x daily - 3 x weekly - 2 sets - 10 reps - 3 sec hold   ASSESSMENT:  CLINICAL IMPRESSION: Dwain "[name]" is currently demonstrating progress with PT with improving balance as evidenced by current performance on standardized balance testing (6 point improvement on Berg, 1 point improvement on DGI, gait speed improve from 2.30 ft/sec to 2.58 ft/sec).  Gains also noted in B proximal LE strength, however continued weakness still contributing to instability and high fall risk.  B knee ROM has plateaued but adequate for normal mobility, gait and stair negotiation.  He continues to demonstrate difficulty with ambulation due to L foot drop which he compensates for with exaggerated L hip flexion, as well as ongoing forward flexed posture with decreased RW proximity.  [name] is progressing toward his PT goals and will benefit from continued skilled PT to address ongoing deficits including improving standing balance for functional activities at home as well as LE strength to improve stability with ambulation to decrease risk for future falls, therefore will recommend recert for continued skilled PT 2x/wk for up to 4-6 weeks.    OBJECTIVE IMPAIRMENTS: Abnormal gait, decreased activity tolerance, decreased balance, decreased coordination, decreased endurance, decreased knowledge of condition, decreased knowledge of use of DME, decreased mobility, difficulty walking, decreased ROM, decreased strength, decreased  safety awareness, increased fascial restrictions, impaired perceived functional ability, increased muscle spasms, impaired flexibility, impaired sensation, improper body mechanics, postural dysfunction, and pain.   ACTIVITY LIMITATIONS: carrying, lifting, bending, sitting, standing, squatting, sleeping, stairs, transfers, bed mobility, bathing, locomotion level, and caring for others  PARTICIPATION LIMITATIONS: meal prep, cleaning, laundry, driving, shopping, and  community activity  PERSONAL FACTORS: Age, Behavior pattern, Fitness, Past/current experiences, Time since onset of injury/illness/exacerbation, and 3+ comorbidities: L2-3, L3-4 Sublaminar Decompression on 12/27/22; Revision of femoral component of L THA on 07/02/22 s/p fall with periprosthetic fracture; Aortic atherosclerosis; carotid stenosis; 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   REHAB POTENTIAL: Good  CLINICAL DECISION MAKING: Evolving/moderate complexity  EVALUATION COMPLEXITY: Moderate   GOALS: Goals reviewed with patient? Yes  SHORT TERM GOALS: Target date: 04/10/2023, extended to 06/07/2023  Patient will be independent with initial HEP. Baseline:  Goal status: MET   2.  Complete balance testing and establish additional STG's/LTG's as appropriate. Baseline:  Goal status: MET  03/22/23  3.  Patient will improve 5x STS time to </= 16 seconds with good eccentric control for improved efficiency and safety with transfers Baseline: 18.94 sec w/o UE assist (uncontrolled descent on most reps) Goal status: MET  05/10/23 - 13.31 sec w/o UE assist with good stability upon standing and improved eccentric control  4.  Patient will demonstrate decreased TUG time to </= 18 sec to decrease risk for falls with transitional mobility Baseline: 21.29 sec with RW; 04/17/23 - 20 sec with RW Goal status: MET 06/14/23 - 17.21 sec  LONG TERM GOALS: Target date: 05/08/2023, extended to 07/05/2023, extended to 08/09/2023  Patient will be independent with advanced/ongoing HEP to improve outcomes and carryover.  Baseline:  Goal  status: IN PROGRESS  06/28/23 - met for current HEP with exceptions of modifications made today  2.  Patient will report at least 50-75% improvement in B knee pain with no further instances of subluxation to improve QOL. Baseline: 5/10 Goal status: MET  05/10/23 - no recent knee pain reported  3.  Patient will demonstrate improved B knee extension AROM to [facility] to allow for improved stability for normal gait and stair mechanics. Baseline: Refer to above LE ROM table Goal status: MET  06/28/23 - knee ROM has plateaued but adequate for normal mobility, gait and stair negotiation  4.  Patient will demonstrate improved B LE strength to >/= 4 to 4+/5 for improved stability and ease of mobility. Baseline: Refer to above LE MMT table Goal status: PARTIALLY MET  06/26/23 - proximal LE strength gradually improving  5.  Patient will be able to ambulate 400' with LRAD and normal gait pattern without increased pain to access community.  Baseline:  Goal status: IN PROGRESS  06/26/23 - 400' with RW and frequent cues for upright posture and improved RW proximity'; increased L hip flexion during swing phase, halfway through decreased overall gait speed and decreased stride length  6. Patient will be able to ascend/descend stairs with 1 HR and reciprocal step pattern safely to access home and community.  Baseline:  Goal status: IN PROGRESS  06/28/23 - able to complete reciprocal ascent with right HR but fatigues w/o L UE support  (  reports he uses cane for other hand at home), step to pattern on descent with continued need for CGA of PT  7.  Patient will report >/= 40/80 on LEFS to demonstrate improved functional ability. Baseline: 31 / 80 = 38.8 %; 05/10/23 - 32 / 80 = 40.0 % Goal status: MET  06/28/23 -  40 / 80 = 50.0 %  8.  Patient will demonstrate at least 19/24 on DGI to decrease risk of falls. Baseline: 11/24 (03/22/23);  13/24 (05/10/23)  Goal status: IN PROGRESS  06/28/23 - 14/24  9.  Patient will  improve Berg score by at least 8 points to improve safety and stability with ADLs in standing and reduce risk for falls. Baseline: 35/56 (03/22/23);  32/56 (05/17/23) Goal status: IN PROGRESS  06/26/23 - 38/56   PLAN:  PT FREQUENCY: 2x/week  PT DURATION: 4-6 weeks  PLANNED INTERVENTIONS: 09811- PT Re-evaluation, 97110-Therapeutic exercises, 97530- Therapeutic activity, 97112- Neuromuscular re-education, 97535- Self Care, 91478- Manual therapy, [phone]- Gait training, [phone]- Aquatic Therapy, [id number]- Electrical stimulation (unattended), 97035- Ultrasound, Patient/Family education, Balance training, Stair training, Taping, Dry Needling, Joint mobilization, Spinal mobilization, DME instructions, Cryotherapy, Moist heat, Therapeutic exercises, Therapeutic activity, Neuromuscular re-education, Gait training, and Self Care  PLAN FOR NEXT SESSION: core/lumbopelvic/B LE flexibility and strengthening; balance training; review and update/progress HEP as indicated; if forward flexed posture with gait persists, consider transition to standard RW   Marry Guan, PT 06/28/2023, 5:21 PM

## 2023-07-02 ENCOUNTER — Encounter: Payer: Self-pay | Admitting: Family

## 2023-07-02 ENCOUNTER — Ambulatory Visit: Payer: Medicare HMO | Admitting: Family

## 2023-07-02 ENCOUNTER — Encounter: Payer: Self-pay | Admitting: Physical Therapy

## 2023-07-02 ENCOUNTER — Ambulatory Visit: Payer: Medicare HMO | Admitting: Physical Therapy

## 2023-07-02 ENCOUNTER — Other Ambulatory Visit: Payer: Self-pay | Admitting: Family

## 2023-07-02 VITALS — BP 151/54 | HR 69 | Temp 97.7°F | Resp 16 | Ht 66.0 in | Wt 159.0 lb

## 2023-07-02 DIAGNOSIS — M5459 Other low back pain: Secondary | ICD-10-CM

## 2023-07-02 DIAGNOSIS — M6281 Muscle weakness (generalized): Secondary | ICD-10-CM | POA: Diagnosis not present

## 2023-07-02 DIAGNOSIS — Z23 Encounter for immunization: Secondary | ICD-10-CM | POA: Diagnosis not present

## 2023-07-02 DIAGNOSIS — M25561 Pain in right knee: Secondary | ICD-10-CM | POA: Diagnosis not present

## 2023-07-02 DIAGNOSIS — R2689 Other abnormalities of gait and mobility: Secondary | ICD-10-CM | POA: Diagnosis not present

## 2023-07-02 DIAGNOSIS — G8929 Other chronic pain: Secondary | ICD-10-CM

## 2023-07-02 DIAGNOSIS — R2681 Unsteadiness on feet: Secondary | ICD-10-CM

## 2023-07-02 DIAGNOSIS — R296 Repeated falls: Secondary | ICD-10-CM

## 2023-07-02 DIAGNOSIS — I10 Essential (primary) hypertension: Secondary | ICD-10-CM | POA: Diagnosis not present

## 2023-07-02 DIAGNOSIS — M25562 Pain in left knee: Secondary | ICD-10-CM | POA: Diagnosis not present

## 2023-07-02 MED ORDER — AMLODIPINE BESYLATE 2.5 MG PO TABS
ORAL_TABLET | ORAL | 1 refills | Status: DC
Start: 2023-07-02 — End: 2023-07-03

## 2023-07-02 NOTE — Therapy (Signed)
OUTPATIENT PHYSICAL THERAPY TREATMENT     Patient Name: [name] Pratt MRN: [id number] DOB:25-Feb-1941, 82 y.o., male Today's Date: 07/02/2023   END OF SESSION:  PT End of Session - 07/02/23 1402     Visit Number 22    Date for PT Re-Evaluation 08/09/23    Authorization Type [name]    Authorization Time Period 07/02/23 - 08/09/23    Authorization - Visit Number 1    Authorization - Number of Visits 10    Progress Note Due on Visit 31   Recert on visit #21 - 06/28/23   PT Start Time 1402    PT Stop Time 1444    PT Time Calculation (min) 42 min    Activity Tolerance Patient tolerated treatment well    Behavior During Therapy [facility] for tasks assessed/performed                   Past Medical History:  Diagnosis Date   Allergic rhinitis 02/02/2016   Anemia 12/17/2009   Formatting of this note might be different from the original. 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 Hypertension   Benign paroxysmal positional vertigo 09/14/2021   Bilateral sacroiliitis (HCC) 07/22/2021   Body mass index (BMI) 25.0-25.9, adult 11/02/2020   Cancer (HCC)    skin cancer   Cardiac murmur 07/25/2021   Carotid stenosis    a. Carotid U/S 5/13: LICA < 50%, RICA 50-69%;  b.  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   Cystoid macular edema of both  eyes 07/28/2020   Degenerative disc disease, lumbar 06/30/2015   Deviated nasal septum 02/18/2015   Diverticulosis 03/03/2020   DJD (degenerative joint disease) of hip 12/24/2012   Dyspnea 06/12/2011   Emphysema, unspecified (HCC) 03/05/2020   Erectile dysfunction 06/20/2017   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   Gait disorder 03/02/2021   GERD (gastroesophageal reflux disease)    GOUT 05/13/2009   Qualifier: Diagnosis of  By: Kem Parkinson     Greater trochanteric pain syndrome 07/22/2021   Hand pain, left 01/16/2022   Hearing loss 02/02/2016   Heme positive stool 02/29/2020   HIATAL HERNIA 05/13/2009   Qualifier: Diagnosis of  By: Kem Parkinson     High risk medication use 01/18/2012   Hip pain 03/02/2021   History of chicken pox    History of COVID-19 12/05/2021   History of hemorrhoids    History of kidney stones    History of skin cancer 07/22/2021   skin cancer   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 Cardiology - Dr Estrella Myrtle at [facility] cardiology  ICD-10 cut over   Hypertension  under control   Hypokalemia 07/08/2021   Hypothyroidism    Internal hemorrhoids 03/03/2020   Leg cramps 11/13/2019   Lower GI bleed 12/12/2012   Lumbago of lumbar region with sciatica 10/21/2020   Lumbar radiculopathy 03/02/2021   Lumbar spondylosis 11/02/2020   NEPHROLITHIASIS 05/13/2009   Qualifier: Diagnosis of  By: Kem Parkinson     Nonspecific abnormal electrocardiogram (ECG) (EKG) 01/06/2013   Numbness of hand 01/24/2022   Onychomycosis 06/30/2015   Osteoarthrosis, 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 Osteoarthritis Of The Hand  10/1 IMO update   Osteoarthrosis, unspecified whether  generalized or localized, pelvic region and thigh 07/25/2010   Formatting of this note might be different from the original. Osteoarthritis Of The Hip   Other abnormal glucose 07/22/2021   Other allergic rhinitis 02/18/2015   Palpitations 07/25/2021   Peripheral neuropathy 06/19/2016   Preventative health care 11/24/2015   Right groin pain 11/29/2012   Right inguinal hernia 05/13/2009   Qualifier: Diagnosis of  By: Kem Parkinson     RLS (restless legs syndrome) 02/18/2015   Rosacea    Rotator cuff tear 07/27/2013   Situational depression 03/02/2021   Skin lesion 01/16/2022   Sleep apnea with use of continuous positive airway pressure (CPAP) 07/15/2013   CPAP set to  7 cm water,  Residual AHi was 7.8  With no leak.  User time 6 hours.  479 days , not used only 12 days - highly compliant 06-23-13  .    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 Fawn Kirk   Ulnar neuropathy 01/24/2022   Urinary retention 11/26/2020   Ventral hernia 07/08/2012   Vertigo 07/08/2021   Weight loss 03/03/2020   Past Surgical History:  Procedure Laterality Date   ABDOMINAL HERNIA REPAIR  07/15/12   Dr Ashley Jacobs   ANTERIOR CERVICAL DECOMP/DISCECTOMY FUSION N/A 07/09/2020   Procedure: ANTERIOR CERVICAL DECOMPRESSION AND FUSION CERVICAL THREE-FOUR.;  Surgeon: Donalee Citrin, MD;  Location: [facility] OR;  Service: Neurosurgery;  Laterality: N/A;  anterior   BROW LIFT  05/07/01   COLONOSCOPY WITH PROPOFOL N/A 04/24/2022   Procedure: COLONOSCOPY WITH PROPOFOL;  Surgeon: Tressia Danas, MD;  Location: [facility] ENDOSCOPY;  Service: Gastroenterology;  Laterality: N/A;   COLONOSCOPY WITH PROPOFOL N/A 04/26/2022   Procedure: COLONOSCOPY WITH  PROPOFOL;  Surgeon: Tressia Danas, MD;  Location: [facility] ENDOSCOPY;  Service: Gastroenterology;  Laterality: N/A;   CYSTOSCOPY WITH URETEROSCOPY AND STENT PLACEMENT Right 01/28/2014   Procedure: CYSTOSCOPY WITH URETEROSCOPY, BASKET RETRIVAL AND  STENT PLACEMENT;  Surgeon: Valetta Fuller, MD;  Location: [facility] ORS;  Service: Urology;  Laterality: Right;   epidural injections     multiple procedures   ESOPHAGEAL DILATION  1993 and 1994   multiple times   EYE SURGERY Bilateral 03/25/10, 2012   cataract removal   HEMORRHOID SURGERY  2014   HIATAL HERNIA REPAIR  12/21/93   HOLMIUM LASER APPLICATION Right 01/28/2014   Procedure: HOLMIUM LASER APPLICATION;  Surgeon: Valetta Fuller, MD;  Location: [facility] ORS;  Service: Urology;  Laterality: Right;   INGUINAL HERNIA REPAIR Right 07/15/12   Dr Ashley Jacobs, x2   JOINT REPLACEMENT  07/25/04   right knee   JOINT REPLACEMENT  07/13/10   left knee   KNEE SURGERY  1995   LAPAROSCOPIC CHOLECYSTECTOMY  09/20/06   with intraoperative cholangiogram and right inguinal herniorrhaphy with mesh    LIGAMENT REPAIR Left 07/2013   shoulder   LITHOTRIPSY     x2   LUMBAR LAMINECTOMY/DECOMPRESSION MICRODISCECTOMY Bilateral 12/27/2022   Procedure: Lumbar Two-Three, Lumbar Three-Four Sublaminar Decompression;  Surgeon: Donalee Citrin, MD;  Location: [facility] OR;  Service: Neurosurgery;  Laterality: Bilateral;   NASAL SEPTUM SURGERY  1975   POLYPECTOMY  04/26/2022   Procedure: POLYPECTOMY;  Surgeon: Tressia Danas, MD;  Location: [facility] ENDOSCOPY;  Service: Gastroenterology;;   POSTERIOR CERVICAL FUSION/FORAMINOTOMY N/A 11/24/2020   Procedure: Posterior Cervical Laminectomy Cervical three-four, Cervical four-five with lateral mass fusion;  Surgeon: Donalee Citrin, MD;  Location: [facility] OR;  Service: Neurosurgery;  Laterality: N/A;   REFRACTIVE SURGERY Bilateral    Right total hip replacement  4/14   SKIN CANCER DESTRUCTION     nose, ear   TONSILLECTOMY  as child   TOTAL HIP ARTHROPLASTY Left     URETHRAL DILATION  1991   Dr. Annabell Howells   Patient Active Problem List   Diagnosis Date Noted   Spinal stenosis of lumbar region 12/27/2022   Drowsiness 12/07/2022   Preop examination 12/06/2022   Weakness of lower extremity 10/10/2022   Skin tear of right elbow without complication 09/18/2022   Skin tear of forearm without complication, left, subsequent encounter 09/18/2022   Fall 09/18/2022   Iron deficiency anemia 09/18/2022   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   Vitamin D deficiency 08/07/2022   Closed fracture of left hip (HCC) 08/07/2022   Hand weakness 05/02/2022   Acute kidney injury (HCC) 05/02/2022   Adenomatous polyp of ascending colon    Adenomatous polyp of colon    Acute metabolic encephalopathy 04/24/2022   GI bleed 04/21/2022   Vasovagal syncope    Hemorrhagic shock (HCC)    Hematochezia    Irregular heart beat 02/03/2022   Constipation 02/03/2022   Carpal tunnel syndrome 01/24/2022   Numbness of hand 01/24/2022   Ulnar neuropathy 01/24/2022   Skin lesion 01/16/2022   History of COVID-19 12/05/2021   Benign paroxysmal positional vertigo 09/14/2021   Palpitations 07/25/2021   Cardiac murmur 07/25/2021   History of chicken pox 07/22/2021   History of hemorrhoids 07/22/2021   COPD (chronic obstructive pulmonary disease) (HCC) 07/22/2021   History of kidney stones 07/22/2021   History of skin cancer 07/22/2021   Complication of anesthesia 07/22/2021   Bilateral sacroiliitis (HCC) 07/22/2021   Other abnormal glucose 07/22/2021   Spondylitis (HCC) 07/22/2021   Greater trochanteric pain syndrome 07/22/2021   Vertigo 07/08/2021   Hypokalemia 07/08/2021   Trochanteric bursitis of left hip 06/30/2021   Cancer (HCC) 04/11/2021   Lumbar radiculopathy 03/02/2021   Gait instability 03/02/2021   Hip pain 03/02/2021   Situational depression 03/02/2021   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    Urinary retention 11/26/2020   Cervical myelopathy (HCC) 11/24/2020   Body mass index (BMI) 25.0-25.9, adult 11/02/2020   Lumbar spondylosis 11/02/2020   Lumbago of lumbar region with sciatica 10/21/2020   Cystoid macular edema of both eyes 07/28/2020   Status post cervical spinal fusion 07/09/2020   History of total bilateral knee replacement 07/08/2020   Aortic atherosclerosis (HCC) 03/05/2020   Emphysema, unspecified (HCC) 03/05/2020   Arthritis 03/04/2020   Diverticulosis 03/03/2020   Internal hemorrhoids 03/03/2020   Weight loss 03/03/2020  Heme positive stool 02/29/2020   Leg cramps 11/13/2019   Tibialis anterior tendon tear, nontraumatic 11/13/2019   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 07/29/2018   CPAP (continuous positive airway pressure) dependence 03/25/2018   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   Erectile dysfunction 06/20/2017   Stenosis of right carotid artery without cerebral infarction 03/06/2017   Peripheral neuropathy 06/19/2016   Asymmetric SNHL (sensorineural hearing loss) 02/29/2016   Hearing loss 02/02/2016   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   Allergic rhinitis 02/02/2016   Atypical chest pain 11/24/2015   Preventative health care 11/24/2015   Onychomycosis 06/30/2015   Degenerative disc disease, lumbar 06/30/2015   Other allergic rhinitis 02/18/2015   Deviated nasal septum 02/18/2015   RLS (restless legs syndrome) 02/18/2015   GERD (gastroesophageal reflux disease) 09/18/2014   Hyperglycemia 03/03/2014   Rotator cuff tear 07/27/2013   Sleep apnea with use of continuous positive airway pressure (CPAP) 07/15/2013   Carotid stenosis 02/24/2013   Nonspecific abnormal electrocardiogram (ECG) (EKG) 01/06/2013   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   Lower GI bleed 12/12/2012   Right groin pain 11/29/2012   OSA (obstructive sleep apnea) 11/25/2012   Ventral hernia 07/08/2012   High risk medication use 01/18/2012   Dyspnea 06/12/2011    Hyperlipidemia 12/27/2010   Osteoarthrosis, unspecified whether generalized or localized, pelvic region and thigh 07/25/2010   Osteoarthrosis, hand 06/13/2010   Anemia 12/17/2009   Hypothyroidism 05/13/2009   GOUT 05/13/2009   Right inguinal hernia 05/13/2009   HIATAL HERNIA 05/13/2009   NEPHROLITHIASIS 05/13/2009   ROSACEA 05/13/2009   Hypertension 05/13/2009    PCP: [name], NP   REFERRING PROVIDER: Susa Raring., MD   REFERRING DIAG:  M25.562, G89.29 (ICD-10-CM) - Chronic pain in left knee  M25.561, G89.29 (ICD-10-CM) - Chronic pain in right knee   THERAPY DIAG:  Chronic pain of both knees  Muscle weakness (generalized)  Unsteadiness on feet  Other abnormalities of gait and mobility  Repeated falls  Other low back pain  RATIONALE FOR EVALUATION AND TREATMENT: Rehabilitation  ONSET DATE: off and on for a couple years  NEXT MD VISIT: none scheduled   SUBJECTIVE:                                                                                                                                                                                                         SUBJECTIVE STATEMENT: Pt reports difficulty getting up earlier today as his feet felt numb and slick.  PAIN: Are you having pain? No - B knees  Are you  having pain? No - low back   PERTINENT HISTORY:  L2-3, L3-4 Sublaminar Decompression on 12/27/22; Revision of femoral component of L THA on 07/02/22 s/p fall with periprosthetic fracture; Aortic atherosclerosis; carotid stenosis; cervical myelopathy s/p ACDF 06/2020 and posterior cervical fusion 11/2020; peripheral neuropathy; lumbar DDD/spondylosis; lumbago with sciatica/lumbar radiculopathy; OA; B TKA; B THA; L greater trochanteric pain syndrome; L RCR 2014; CTR; hearing loss; HTN; BPPV/vertigo; B sacroiliitis; skin cancer; COPD/emphysema; sleep apnea with CPAP; GERD; gout; HLD; LE cramps; RLS   PRECAUTIONS: Fall  WEIGHT BEARING  RESTRICTIONS: No  FALLS:  Has patient fallen in last 6 months? Yes. 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  No fracture or bony lesion or other findings.  Impression: Right knee replacement in good condition.   01/31/23 - L knee x-ray: No fracture or bony lesion or arthritic change or other abnormality.  Left  knee replacement looks well-fixed and in excellent position.  Impression: Knee replacement in good condition.   PATIENT SURVEYS:  LEFS 31 / 80 = 38.8 % 05/10/23: 32 / 80 = 40.0 %  COGNITION: Overall cognitive status: Within functional limits for tasks assessed and History of cognitive impairments - at baseline    SENSATION: Intermittent peripheral neuropathy in B feet  MUSCLE LENGTH: TBA Hamstrings:  ITB:  Piriformis:  Hip flexors:  Quads:  Heelcord:   POSTURE:  rounded shoulders, forward head, and flexed trunk   LOWER EXTREMITY ROM: Limited B hip and ankle ROM Active ROM Right eval Left eval Right 04/03/23 Left 04/03/23 R/L 05/28/23 R/L 06/28/23  Knee flexion 104 96 110 108  108/108  Knee extension  -10 -5 -4 -2 4 in LAQ/ 1 in LAQ -4/-2 in LAQ 0/0 supported   Passive ROM Right eval Left eval  Knee flexion    Knee extension 0 0  (Blank rows = not tested)  LOWER EXTREMITY MMT:  MMT Right Eval* Left   Eval* R * 05/10/23 L * 05/10/23 R 06/21/23 L 06/21/23  Hip flexion 3+ 4 3+ 4- 4- 4  Hip extension 3- 3+ 3+ 3+ 4 4+  Hip abduction 4- 4 4 4 4 4   Hip adduction 4 4 4 4 4  4+  Hip internal rotation 3+ 4- 3+ 4-    Hip external rotation 2+ 3- 2+ 3-    Knee flexion 4 4 4+ 4+    Knee extension 4 4+ 4 4 4  4+  Ankle dorsiflexion 4+ 4 4+ 4-    Ankle plantarflexion        Ankle inversion  Ankle eversion         (Blank rows = not tested, *tested in sitting)  FUNCTIONAL TESTS: (Remaining tests to be assessed next visit with PT) 5 times sit to stand: 18.94 sec w/o UE assist (uncontrolled descent on most reps) (03/22/23) Timed up and go (TUG): 21.29 sec with RW (03/22/23) 10 meter walk test: 21.13 sec with RW; Gait speed = 1.55 ft/sec  Berg Balance Scale: 35/56; <36 = High risk for falls (close to 100%) (03/22/23) Dynamic Gait Index: 11/24; Scores of 19 or less are predictive of falls in older community living adults (03/22/23)  04/17/23: TUG: 20 sec  5xSTS: 18 sec  05/10/23: 5xSTS: 13.31 sec w/o UE assist TUG: 21.06 with RW, difficulty with turning : 14.25 sec with RW Gait speed: 2.30 ft/sec with RW DGI: 13/24  05/17/23: Sharlene Motts: 32/56  10/15/24Sharlene Motts: 38/56  06/28/23: : 12.72 sec with RW Gait speed = 2.58 ft/sec with RW  GAIT: Distance walked: 60 ft Assistive device utilized: Environmental consultant - 2 wheeled Level of assistance: SBA Gait pattern: step through pattern, decreased stride length, decreased hip/knee flexion- Right, decreased hip/knee flexion- Left, decreased ankle dorsiflexion- Right, decreased ankle dorsiflexion- Left, trunk flexed, poor foot clearance- Right, and poor foot clearance- Left Comments: Cues necessary for improved upright posture and rolling walker  proximity   TODAY'S TREATMENT:   07/02/23  THERAPEUTIC EXERCISE: to improve flexibility, strength and mobility.  Demonstration, verbal and tactile cues throughout for technique. NuStep - L6 x 6 min Hip flexion march + ankle DF with looped YTB at midfeet x 20  NEUROMUSCULAR RE-EDUCATION: To improve posture, balance, proprioception, coordination, and reduce fall risk. Standing "penguin" lateral weight shift with looped GTB at ankles x 20 Staggered stance "penguin" ant/post weight shift with looped GTB at ankles x 20 with each foot fwd Staggered stance with fwd foot on 2" block Static stance x 30" OH UE raise with side-to-side reach x 5 Toe clears to 9" step x 10 - increased fatigue on L with difficulty clearing toes on last 2 reps   06/28/23 - Recert THERAPEUTIC EXERCISE: to improve flexibility, strength and mobility.  Demonstration, verbal and tactile cues throughout for technique. NuStep - L6 x 6 min Seated GTB clam x 10 (using band with looped "handles" on either wrapped around legs and crossed over on top as pt has difficulty placing looped band around his legs) - replacing hooklying bent-knee fallout in HEP Seated eccentric squat touches to chair with counter support x 10 - replacing bridge in HEP as bed too soft   THERAPEUTIC ACTIVITIES: : 12.72 sec with RW Gait speed = 2.58 ft/sec with RW DGI: 14/24 Knee ROM Goal assessment Stairs: Level of Assistance: CGA Stair Negotiation Technique: Step to Pattern/Alternating Pattern, Forwards with Single Rail on Right Number of Stairs: 14  Height of Stairs: 7"  Comments: able to complete reciprocal ascent with right HR but fatigues w/o L UE support  (reports he uses cane for other hand at home), step to pattern on descent with continued need for CGA of PT   06/26/23 - PN THERAPEUTIC EXERCISE: to improve flexibility, strength and mobility.  Demonstration, verbal and tactile cues throughout for technique. NuStep - L6 x 7  min  Therapeutic Activity: to assess functional performance. BERG: 38/56 Gait for 400' with RW - increased L hip flexion during swing phase, halfway through decreased overall gait speed and decreased stride length Consult with patient about progress and outlook on remainder of PT   PATIENT  EDUCATION:  Education details: Gaffer with RW and continue with HEP   Person educated: Patient Education method: Explanation, Demonstration, Verbal cues, and Handouts Education comprehension: verbalized understanding, returned demonstration, verbal cues required, and needs further education  HOME EXERCISE PROGRAM: Access Code: [email or web address] URL: [email or web address] Date: 06/28/2023 Prepared by: Glenetta Hew  Exercises - Seated Hamstring Stretch with Strap  - 1 x daily - 7 x weekly - 3 sets - 30 sec hold - Seated Quad Set  - 1 x daily - 7 x weekly - 3 sets - 10 reps - 5 seconds hold - Seated Hip Adduction Squeeze with Ball  - 1 x daily - 7 x weekly - 3 sets - 10 reps - 5 secs hold - Supine Quad Set on Towel Roll  - 1 x daily - 7 x weekly - 2 sets - 10 reps - 3 sec hold - Standing Gastroc Stretch at Counter  - 1 x daily - 7 x weekly - 3 sets - 3 reps - 30 sec hold - Seated Shoulder Horizontal Abduction with Resistance - Palms Down  - 1 x daily - 7 x weekly - 3 sets - 10 reps - Seated Anti-Rotation Press With Anchored Resistance  - 1 x daily - 7 x weekly - 3 sets - 10 reps - Standing Hip Abduction with Counter Support  - 1 x daily - 3 x weekly - 2 sets - 10 reps - Standing Hip Extension with Counter Support  - 1 x daily - 3 x weekly - 2 sets - 10 reps - Standing Knee Flexion with Counter Support  - 1 x daily - 3 x weekly - 2 sets - 10 reps - Seated Long Arc Quad  - 1 x daily - 3 x weekly - 2 sets - 10 reps - Seated March  - 1 x daily - 3 x weekly - 2 sets - 10 reps - Seated Hip Abduction with Resistance  - 1 x daily - 3 x weekly - 2 sets - 10 reps - 3-5 sec hold - Squat with Chair and  Counter Support  - 1 x daily - 3 x weekly - 2 sets - 10 reps - 3 sec hold   ASSESSMENT:  CLINICAL IMPRESSION: Rae Mar" notes increased difficulty with sit to stand this morning, stating his feet felt numb and were sliding out from under him. During therapy session he was able to achieve sit to stand w/o need for UE assist on multiple attempts but did require wide BOS.  He was noted to fatigue quickly with proximal LE strength on right and distal LE strength on left, both contributing to limited foot clearance during therapeutic exercises/activities and gait.  [name] will benefit from continued skilled PT to address ongoing deficits including improving standing balance for functional activities at home as well as LE strength to improve stability with ambulation to decrease risk for future falls.   OBJECTIVE IMPAIRMENTS: Abnormal gait, decreased activity tolerance, decreased balance, decreased coordination, decreased endurance, decreased knowledge of condition, decreased knowledge of use of DME, decreased mobility, difficulty walking, decreased ROM, decreased strength, decreased safety awareness, increased fascial restrictions, impaired perceived functional ability, increased muscle spasms, impaired flexibility, impaired sensation, improper body mechanics, postural dysfunction, and pain.   ACTIVITY LIMITATIONS: carrying, lifting, bending, sitting, standing, squatting, sleeping, stairs, transfers, bed mobility, bathing, locomotion level, and caring for others  PARTICIPATION LIMITATIONS: meal prep, cleaning, laundry, driving, shopping, and community activity  PERSONAL FACTORS: Age, Behavior pattern, Fitness, Past/current experiences,  Time since onset of injury/illness/exacerbation, and 3+ comorbidities: L2-3, L3-4 Sublaminar Decompression on 12/27/22; Revision of femoral component of L THA on 07/02/22 s/p fall with periprosthetic fracture; Aortic atherosclerosis; carotid stenosis; cervical myelopathy  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   REHAB POTENTIAL: Good  CLINICAL DECISION MAKING: Evolving/moderate complexity  EVALUATION COMPLEXITY: Moderate   GOALS: Goals reviewed with patient? Yes  SHORT TERM GOALS: Target date: 04/10/2023, extended to 06/07/2023  Patient will be independent with initial HEP. Baseline:  Goal status: MET   2.  Complete balance testing and establish additional STG's/LTG's as appropriate. Baseline:  Goal status: MET  03/22/23  3.  Patient will improve 5x STS time to </= 16 seconds with good eccentric control for improved efficiency and safety with transfers Baseline: 18.94 sec w/o UE assist (uncontrolled descent on most reps) Goal status: MET  05/10/23 - 13.31 sec w/o UE assist with good stability upon standing and improved eccentric control  4.  Patient will demonstrate decreased TUG time to </= 18 sec to decrease risk for falls with transitional mobility Baseline: 21.29 sec with RW; 04/17/23 - 20 sec with RW Goal status: MET 06/14/23 - 17.21 sec  LONG TERM GOALS: Target date: 05/08/2023, extended to 07/05/2023, extended to 08/09/2023  Patient will be independent with advanced/ongoing HEP to improve outcomes and carryover.  Baseline:  Goal status: IN PROGRESS  06/28/23 - met for current HEP with exceptions of modifications made today  2.  Patient will report at least 50-75% improvement in B knee pain with no further instances of subluxation to improve QOL. Baseline: 5/10 Goal status: MET  05/10/23 - no recent knee pain reported  3.  Patient will demonstrate improved B knee extension AROM to [facility] to  allow for improved stability for normal gait and stair mechanics. Baseline: Refer to above LE ROM table Goal status: MET  06/28/23 - knee ROM has plateaued but adequate for normal mobility, gait and stair negotiation  4.  Patient will demonstrate improved B LE strength to >/= 4 to 4+/5 for improved stability and ease of mobility. Baseline: Refer to above LE MMT table Goal status: PARTIALLY MET  06/26/23 - proximal LE strength gradually improving  5.  Patient will be able to ambulate 400' with LRAD and normal gait pattern without increased pain to access community.  Baseline:  Goal status: IN PROGRESS  06/26/23 - 400' with RW and frequent cues for upright posture and improved RW proximity'; increased L hip flexion during swing phase, halfway through decreased overall gait speed and decreased stride length  6. Patient will be able to ascend/descend stairs with 1 HR and reciprocal step pattern safely to access home and community.  Baseline:  Goal status: IN PROGRESS  06/28/23 - able to complete reciprocal ascent with right HR but fatigues w/o L UE support  (reports he uses cane for other hand at home), step to pattern on descent with continued need for CGA of PT  7.  Patient will report >/= 40/80 on LEFS to demonstrate improved functional ability. Baseline: 31 / 80 = 38.8 %; 05/10/23 - 32 / 80 = 40.0 % Goal status: MET  06/28/23 -  40 / 80 = 50.0 %  8.  Patient will demonstrate at least 19/24 on DGI to decrease risk of falls. Baseline: 11/24 (03/22/23);  13/24 (05/10/23)  Goal status: IN PROGRESS  06/28/23 - 14/24  9.  Patient will improve Berg score by at least 8 points to improve safety and stability with ADLs in standing and reduce risk for falls. Baseline: 35/56 (03/22/23);  32/56 (05/17/23) Goal status: IN PROGRESS  06/26/23 - 38/56   PLAN:  PT FREQUENCY: 2x/week  PT DURATION: 4-6 weeks  PLANNED INTERVENTIONS: 01027- PT Re-evaluation, 97110-Therapeutic exercises, 97530- Therapeutic  activity, 97112- Neuromuscular re-education, 97535- Self Care, 25366- Manual therapy, [phone]- Gait training, [phone]- Aquatic Therapy, 97014- Electrical stimulation (unattended), 97035- Ultrasound, Patient/Family education, Balance training, Stair training, Taping, Dry Needling, Joint mobilization, Spinal mobilization, DME instructions, Cryotherapy, Moist heat, Therapeutic exercises, Therapeutic activity, Neuromuscular re-education, Gait training, and Self Care  PLAN FOR NEXT SESSION: core/lumbopelvic/B LE flexibility and strengthening; balance training; review and update/progress HEP as indicated; if forward flexed posture with gait persists, consider transition to standard RW   Marry Guan, PT 07/02/2023, 6:21 PM

## 2023-07-02 NOTE — Progress Notes (Signed)
Subjective:     Patient ID: [name] Pratt, male    DOB: 25-Aug-1941, 82 y.o.   MRN: [id number]  Chief Complaint  Patient presents with   Hypertension    Here for follow up    HPI  Discussed the use of AI scribe software for clinical note transcription with the patient, who gave verbal consent to proceed.  History of Present Illness   The patient, with a history of hypertension, presents for a follow-up visit. He reports variable blood pressure readings at home, with some readings as high as 151/54. He is currently on amlodipine 2.5mg , valsartan 320mg , and hydralazine 50mg  three times a day. He reports tolerating these medications well.   In addition to his hypertension, the patient reports ongoing issues with pain and numbness in his arms, which he attributes to his hx of spinal issues in his cervical spine. He continues to use a walker and a cane. He also reports a persistent sore on his right foot, which he believes may be due to his shoes rubbing against the skin. He plans to show this to his podiatrist. He has been managing this with a band-aid, but notes that the sore has not healed.  The patient also mentions difficulty with mobility, particularly in the morning when he first gets out of bed. He describes his feet as feeling "slick" and has difficulty gaining traction on his carpeted floor. He also mentions needing a pedicure to help manage arthritis in his toes.      BP Readings from Last 3 Encounters:  07/02/23 (!) 151/54  05/25/23 (!) 145/62  05/09/23 (!) 164/74      Lab Results  Component Value Date   HGBA1C 5.6 01/22/2023    Health Maintenance Due  Topic Date Due   FOOT EXAM  09/29/2021   Medicare Annual Wellness (AWV)  01/20/2023   Diabetic kidney evaluation - Urine ACR  03/16/2023   COVID-19 Vaccine (5 - 2023-24 season) 05/13/2023    Past Medical History:  Diagnosis Date   Allergic rhinitis 02/02/2016   Anemia 12/17/2009   Formatting of this note  might be different from the original. 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 Hypertension   Benign paroxysmal positional vertigo 09/14/2021   Bilateral sacroiliitis (HCC) 07/22/2021   Body mass index (BMI) 25.0-25.9, adult 11/02/2020   Cancer (HCC)    skin cancer   Cardiac murmur 07/25/2021   Carotid stenosis    a. Carotid U/S 5/13: LICA < 50%, RICA 50-69%;  b.  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   Cystoid macular edema of both eyes 07/28/2020   Degenerative disc disease, lumbar 06/30/2015   Deviated nasal septum 02/18/2015   Diverticulosis 03/03/2020   DJD (degenerative joint disease) of hip 12/24/2012   Dyspnea 06/12/2011   Emphysema, unspecified (HCC) 03/05/2020   Erectile dysfunction 06/20/2017  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   Gait disorder 03/02/2021   GERD (gastroesophageal reflux disease)    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   Qualifier: Diagnosis of  By: Kem Parkinson     Greater trochanteric pain syndrome 07/22/2021   Hand pain, left 01/16/2022   Hearing loss 02/02/2016   Heme positive stool 02/29/2020   HIATAL HERNIA 05/13/2009   Qualifier: Diagnosis of  By: Kem Parkinson     High risk medication use 01/18/2012   Hip pain 03/02/2021   History of chicken pox     History of COVID-19 12/05/2021   History of hemorrhoids    History of kidney stones    History of skin cancer 07/22/2021   skin cancer   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 Osteoarthritis Of The Hand  10/1 IMO update   Osteoarthrosis, unspecified whether generalized or localized, pelvic region and thigh 07/25/2010   Formatting of this note might be different from the original. Osteoarthritis Of The Hip   Other abnormal glucose 07/22/2021   Other allergic rhinitis 02/18/2015   Palpitations 07/25/2021   Peripheral neuropathy 06/19/2016   Preventative health care 11/24/2015   Right groin pain 11/29/2012   Right inguinal hernia 05/13/2009   Qualifier: Diagnosis of  By: Kem Parkinson     RLS (restless legs syndrome) 02/18/2015   Rosacea    Rotator cuff tear 07/27/2013   Situational depression 03/02/2021   Skin lesion 01/16/2022   Sleep apnea with use of continuous positive airway pressure (CPAP) 07/15/2013   CPAP set to  7 cm water,  Residual AHi was 7.8  With no leak.  User time 6 hours.  479 days , not used only 12 days - highly compliant 06-23-13  .    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 Fawn Kirk   Ulnar neuropathy 01/24/2022   Urinary retention 11/26/2020   Ventral hernia 07/08/2012   Vertigo 07/08/2021   Weight loss 03/03/2020    Past Surgical History:  Procedure Laterality Date   ABDOMINAL HERNIA REPAIR  07/15/12   Dr Ashley Jacobs   ANTERIOR CERVICAL DECOMP/DISCECTOMY FUSION N/A 07/09/2020   Procedure: ANTERIOR CERVICAL DECOMPRESSION AND FUSION CERVICAL THREE-FOUR.;  Surgeon: Donalee Citrin, MD;  Location: [facility] OR;  Service: Neurosurgery;  Laterality: N/A;  anterior   BROW LIFT  05/07/01   COLONOSCOPY WITH PROPOFOL N/A 04/24/2022   Procedure: COLONOSCOPY WITH PROPOFOL;  Surgeon: Tressia Danas, MD;  Location: [facility] ENDOSCOPY;  Service: Gastroenterology;  Laterality: N/A;   COLONOSCOPY WITH PROPOFOL N/A 04/26/2022   Procedure: COLONOSCOPY WITH PROPOFOL;  Surgeon: Tressia Danas, MD;  Location[facility] ENDOSCOPY;  Service: Gastroenterology;  Laterality: N/A;   CYSTOSCOPY WITH URETEROSCOPY AND STENT PLACEMENT Right 01/28/2014   Procedure: CYSTOSCOPY WITH URETEROSCOPY, BASKET RETRIVAL AND  STENT PLACEMENT;  Surgeon: Valetta Fuller, MD;  Location: [facility] ORS;  Service: Urology;  Laterality: Right;   epidural injections     multiple procedures   ESOPHAGEAL DILATION  1993 and 1994   multiple times   EYE SURGERY Bilateral 03/25/10, 2012   cataract removal   HEMORRHOID SURGERY  2014   HIATAL HERNIA REPAIR  12/21/93   HOLMIUM LASER APPLICATION Right 01/28/2014   Procedure: HOLMIUM LASER APPLICATION;  Surgeon: Valetta Fuller, MD;  Location: [facility] ORS;  Service: Urology;  Laterality: Right;   INGUINAL HERNIA REPAIR Right 07/15/12   Dr Ashley Jacobs, x2   JOINT REPLACEMENT  07/25/04   right knee    JOINT REPLACEMENT  07/13/10   left knee   KNEE SURGERY  1995   LAPAROSCOPIC CHOLECYSTECTOMY  09/20/06   with intraoperative cholangiogram and right inguinal herniorrhaphy with mesh    LIGAMENT REPAIR Left 07/2013   shoulder   LITHOTRIPSY     x2   LUMBAR LAMINECTOMY/DECOMPRESSION MICRODISCECTOMY Bilateral 12/27/2022   Procedure: Lumbar Two-Three, Lumbar Three-Four Sublaminar Decompression;  Surgeon: Donalee Citrin, MD;  Location: [facility] OR;  Service: Neurosurgery;  Laterality: Bilateral;   NASAL SEPTUM SURGERY  1975   POLYPECTOMY  04/26/2022   Procedure: POLYPECTOMY;  Surgeon: Tressia Danas, MD;  Location: [facility] ENDOSCOPY;  Service: Gastroenterology;;   POSTERIOR CERVICAL FUSION/FORAMINOTOMY N/A 11/24/2020   Procedure: Posterior Cervical Laminectomy Cervical three-four, Cervical four-five with lateral mass fusion;  Surgeon: Donalee Citrin, MD;  Location: [facility] OR;  Service: Neurosurgery;  Laterality: N/A;   REFRACTIVE SURGERY Bilateral    Right total hip replacement  4/14   SKIN CANCER DESTRUCTION     nose, ear   TONSILLECTOMY  as child   TOTAL HIP ARTHROPLASTY Left    URETHRAL DILATION  1991   Dr. 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   2 grandchildren   Has worked for [employer]/r[employer]   Enjoys wood working   Completed HS.  [employer]   Social Determinants of Health   Financial Resource Strain: Low Risk  (10/28/2021)   Overall Financial Resource Strain (CARDIA)    Difficulty of Paying Living Expenses: Not very hard  Food Insecurity: Unknown (06/05/2022)   Hunger Vital Sign    Worried About Running Out of Food in the Last Year: Never true    Ran Out of Food in the Last Year: Not on file  Transportation Needs: Unknown (06/05/2022)   PRAPARE - Administrator, Civil Service (Medical): No    Lack of Transportation (Non-Medical): Not on file  Physical Activity: Insufficiently Active (06/04/2023)   Exercise Vital Sign    Days of Exercise per Week: 1 day    Minutes of Exercise per Session: 10 min  Stress: Stress Concern Present (06/04/2023)   [employer] of Occupational Health - Occupational Stress Questionnaire    Feeling of Stress : To some extent  Social Connections: Moderately Isolated (01/09/2023)   Social Connection and Isolation Panel [NHANES]    Frequency of Communication with Friends and Family: Three times a week    Frequency of Social Gatherings with Friends and Family: Three times a week    Attends Religious Services: Never    Active Member of Clubs or Organizations: No    Attends Banker Meetings: Never    Marital Status: Married  Catering manager Violence: Not At Risk (01/19/2022)   Humiliation, Afraid, Rape, and Kick questionnaire    Fear of Current or Ex-Partner: No    Emotionally Abused: No    Physically Abused: No    Sexually Abused: No    Outpatient Medications Prior to Visit  Medication Sig Dispense Refill   acetaminophen (TYLENOL) 500  MG tablet Take 500 mg by mouth 2 (two) times daily.     albuterol (VENTOLIN HFA) 108 (90 Base) MCG/ACT inhaler INHALE 2 PUFFS INTO THE LUNGS EVERY 6 HOURS AS NEEDED FOR SHORTNESS OF BREATH. (Patient taking differently: Inhale 2 puffs into the lungs every 6 (six) hours as needed for shortness of breath.) 1 each 3   allopurinol (ZYLOPRIM) 100 MG tablet TAKE 1 TABLET (100 MG TOTAL) BY MOUTH 2 (TWO) TIMES DAILY. 180 tablet 1   antiseptic oral rinse (BIOTENE) LIQD 15 mLs by Mouth Rinse route 2 (two) times daily.     aspirin EC 81 MG tablet Take 81 mg by mouth at bedtime.     bacitracin ointment Apply 1 Application topically 2 (two) times daily. (Patient taking differently: Apply 1 Application topically daily as needed for wound care.) 120 g 0   cholestyramine (QUESTRAN) 4 GM/DOSE powder Take by mouth daily. 30 ml     cycloSPORINE (RESTASIS) 0.05 % ophthalmic emulsion Place 1 drop into both eyes 2 (two) times daily.     docusate sodium (COLACE) 100 MG capsule Take 1 capsule (100 mg total) by mouth every 12 (twelve) hours. 60 capsule 0   fluticasone (FLONASE) 50 MCG/ACT nasal spray Place 1 spray into both nostrils daily as needed for allergies.     gabapentin (NEURONTIN) 300 MG capsule TAKE 1 CAPSULE TWO TO THREE TIMES DAILY AS NEEDED 180 capsule 0   GEMTESA 75 MG TABS Take 75 mg by mouth daily.     glucose blood test strip TRUE Metrix Blood Glucose Test Strip, use as instructed 100 each 12   hydrALAZINE (APRESOLINE) 50  MG tablet Take 1 tablet (50 mg total) by mouth 3 (three) times daily. 270 tablet 0   Iron, Ferrous Sulfate, 325 (65 Fe) MG TABS Take 325 mg by mouth every other day. (Patient taking differently: Take 325 mg by mouth daily.) 30 tablet 0   Lancets MISC 1 each by Does not apply route 2 (two) times daily. TRUE matrix lancets 100 each 3   levothyroxine (SYNTHROID) 150 MCG tablet TAKE 1 TABLET ON 6 DAYS PER WEEK (TAKE 75 MCG ONE TIME PER WEEK) 78 tablet 3   levothyroxine (SYNTHROID) 75 MCG tablet  Take 75 mcg once a week (patient on 150 mcg 6 days a week) Dispense #24 for 90 day supply. (Patient taking differently: Take 75 mcg once a week (patient on 150 mcg 6 days a week) Dispense #24 for 90 day supply. Sunday) 12 tablet 1   loratadine (CLARITIN) 10 MG tablet Take 10 mg by mouth daily as needed for allergies or rhinitis.     Magnesium 500 MG TABS Take 500 mg by mouth daily.     Menthol, Topical Analgesic, (ICY HOT EX) Apply 1 Application topically at bedtime. Lidocaine / on back     metroNIDAZOLE (METROCREAM) 0.75 % cream Apply 1 Application topically daily as needed (head).     Multiple Vitamins-Minerals (MULTIVITAMIN WITH MINERALS) tablet Take 1 tablet by mouth daily.     multivitamin-lutein (OCUVITE-LUTEIN) CAPS capsule Take 1 capsule by mouth daily.     nitroGLYCERIN (NITROSTAT) 0.4 MG SL tablet Place 1 tablet (0.4 mg total) under the tongue every 5 (five) minutes as needed for chest pain. 25 tablet 6   OVER THE COUNTER MEDICATION Place 1 spray into both nostrils 2 (two) times daily. Sinus saline     OVER THE COUNTER MEDICATION Take 15 mLs by mouth at bedtime. Yellow Mustard     polyethylene glycol powder (GLYCOLAX/MIRALAX) 17 GM/SCOOP powder Take 17 g by mouth 2 (two) times daily as needed for mild constipation, moderate constipation or severe constipation. 255 g 0   polyvinyl alcohol (ARTIFICIAL TEARS) 1.4 % ophthalmic solution Place 1 drop into both eyes daily as needed for dry eyes.     PREBIOTIC PRODUCT PO Take 1 tablet by mouth daily.     PRESCRIPTION MEDICATION CPAP- At bedtime and during any time of rest     Probiotic Product (PROBIOTIC DAILY PO) Take 1 capsule by mouth daily as needed (Constipation).     rosuvastatin (CRESTOR) 5 MG tablet TAKE 1 TABLET EVERY DAY 90 tablet 1   valsartan (DIOVAN) 320 MG tablet Take 1 tablet (320 mg total) by mouth daily. 90 tablet 1   vitamin B-12 (CYANOCOBALAMIN) 500 MCG tablet Take 500 mcg by mouth in the morning.     amLODipine (NORVASC) 2.5  MG tablet Take 1 tablet (2.5 mg total) by mouth daily. 90 tablet 1   No facility-administered medications prior to visit.    Allergies  Allergen Reactions   Pravastatin Other (See Comments)    Muscle cramps   Sildenafil Other (See Comments)    Tachycardia     Oxycodone Other (See Comments)    Hallucinations    Atenolol Other (See Comments)    Bradycardia    Metoclopramide Hcl Anxiety    ROS    See HPI Objective:    Physical Exam Constitutional:      General: He is not in acute distress.    Appearance: He is well-developed.  HENT:     Head: Normocephalic and atraumatic.  Cardiovascular:     Rate and Rhythm: Normal rate and regular rhythm.     Heart sounds: No murmur heard. Pulmonary:     Effort: Pulmonary effort is normal. No respiratory distress.     Breath sounds: Normal breath sounds. No wheezing or rales.  Skin:    General: Skin is warm and dry.     Comments: Small blister noted right medial/dorsal foot  Neurological:     Mental Status: He is alert and oriented to person, place, and time.  Psychiatric:        Behavior: Behavior normal.        Thought Content: Thought content normal.      BP (!) 151/54 Comment: home reading this am  Pulse 69   Temp 97.7 F (36.5 C) (Oral)   Resp 16   Ht 5\' 6"  (1.676 m)   Wt 159 lb (72.1 kg)   SpO2 98%   BMI 25.66 kg/m  Wt Readings from Last 3 Encounters:  07/02/23 159 lb (72.1 kg)  05/25/23 155 lb (70.3 kg)  05/09/23 154 lb (69.9 kg)       Assessment & Plan:   Problem List Items Addressed This Visit       Unprioritized   Hypertension    BP is a bit elevated today.  I recommended the following:   Blood pressure elevated at 151/54 today. Patient currently on Amlodipine 2.5mg , Valsartan 320mg , and Hydralazine 50mg  TID. Discussed increasing Amlodipine to 5mg  daily, but patient concerned about hypotension. -Increase Amlodipine to 2.5mg  BID. -Hold evening dose if BP <110/50. -Check BP before evening dose  daily. -Follow-up in 1 month to reassess BP control.      Relevant Medications   amLODipine (NORVASC) 2.5 MG tablet   Other Visit Diagnoses     Needs flu shot    -  Primary   Relevant Orders   Flu Vaccine Trivalent High Dose (Fluad) (Completed)       I have changed Miquan B. Mose "[name]"'s amLODipine. I am also having him maintain his cycloSPORINE, aspirin EC, glucose blood, Lancets, nitroGLYCERIN, Magnesium, albuterol, Gemtesa, PRESCRIPTION MEDICATION, vitamin B-12, loratadine, antiseptic oral rinse, polyvinyl alcohol, Probiotic Product (PROBIOTIC DAILY PO), multivitamin with minerals, levothyroxine, Iron (Ferrous Sulfate), bacitracin, PREBIOTIC PRODUCT PO, acetaminophen, fluticasone, (Menthol, Topical Analgesic, (ICY HOT EX)), metroNIDAZOLE, OVER THE COUNTER MEDICATION, cholestyramine, OVER THE COUNTER MEDICATION, multivitamin-lutein, docusate sodium, polyethylene glycol powder, rosuvastatin, allopurinol, valsartan, hydrALAZINE, gabapentin, and levothyroxine.  Meds ordered this encounter  Medications   amLODipine (NORVASC) 2.5 MG tablet    Sig: Hold amlodipine dose if blood pressure <110/50.    Dispense:  135 tablet    Refill:  1    This dose replaces the 5mg  dose- please cancel 5mg  dose I sent earlier.    Order Specific Question:   Supervising Provider    Answer:   Danise Edge A [[id number]]

## 2023-07-02 NOTE — Patient Instructions (Addendum)
VISIT SUMMARY:  During your visit today, we discussed your ongoing issues with high blood pressure, pain and numbness in your arms, a persistent sore on your foot, and difficulty with mobility. We made some changes to your treatment plan to better manage these issues.  YOUR PLAN:  -HIGH BLOOD PRESSURE: Your blood pressure has been high, so we're adjusting your medication. We're increasing your Amlodipine dosage to 2.5mg  twice a day. However, if your blood pressure drops below 110/50, skip the evening dose. Please check your blood pressure before your evening dose every day.  -FOOT SORE: You have a sore on your foot that isn't healing. Please follow up with your podiatrist for this.   -GENERAL HEALTH: To keep you healthy during flu season, we gave you the influenza vaccine today.  INSTRUCTIONS:  Please remember to check your blood pressure daily before your evening dose of Amlodipine. If it's below 110/50, skip that dose. We'll reassess your blood pressure control in a month.

## 2023-07-02 NOTE — Assessment & Plan Note (Signed)
BP is a bit elevated today.  I recommended the following:   Blood pressure elevated at 151/54 today. Patient currently on Amlodipine 2.5mg , Valsartan 320mg , and Hydralazine 50mg  TID. Discussed increasing Amlodipine to 5mg  daily, but patient concerned about hypotension. -Increase Amlodipine to 2.5mg  BID. -Hold evening dose if BP <110/50. -Check BP before evening dose daily. -Follow-up in 1 month to reassess BP control.

## 2023-07-04 ENCOUNTER — Encounter: Payer: Medicare HMO | Admitting: Physical Therapy

## 2023-07-04 DIAGNOSIS — M5416 Radiculopathy, lumbar region: Secondary | ICD-10-CM | POA: Diagnosis not present

## 2023-07-09 ENCOUNTER — Encounter: Payer: Self-pay | Admitting: Physical Therapy

## 2023-07-09 ENCOUNTER — Ambulatory Visit: Payer: Medicare HMO | Admitting: Physical Therapy

## 2023-07-09 DIAGNOSIS — M5459 Other low back pain: Secondary | ICD-10-CM

## 2023-07-09 DIAGNOSIS — R296 Repeated falls: Secondary | ICD-10-CM

## 2023-07-09 DIAGNOSIS — M6281 Muscle weakness (generalized): Secondary | ICD-10-CM

## 2023-07-09 DIAGNOSIS — M25562 Pain in left knee: Secondary | ICD-10-CM | POA: Diagnosis not present

## 2023-07-09 DIAGNOSIS — R2689 Other abnormalities of gait and mobility: Secondary | ICD-10-CM

## 2023-07-09 DIAGNOSIS — R2681 Unsteadiness on feet: Secondary | ICD-10-CM

## 2023-07-09 DIAGNOSIS — M25561 Pain in right knee: Secondary | ICD-10-CM | POA: Diagnosis not present

## 2023-07-09 DIAGNOSIS — G8929 Other chronic pain: Secondary | ICD-10-CM

## 2023-07-16 ENCOUNTER — Encounter: Payer: Self-pay | Admitting: Physical Therapy

## 2023-07-16 ENCOUNTER — Ambulatory Visit: Payer: Medicare HMO | Attending: Family | Admitting: Physical Therapy

## 2023-07-16 DIAGNOSIS — M6281 Muscle weakness (generalized): Secondary | ICD-10-CM | POA: Insufficient documentation

## 2023-07-16 DIAGNOSIS — M25561 Pain in right knee: Secondary | ICD-10-CM | POA: Diagnosis not present

## 2023-07-16 DIAGNOSIS — M5459 Other low back pain: Secondary | ICD-10-CM | POA: Insufficient documentation

## 2023-07-16 DIAGNOSIS — R2689 Other abnormalities of gait and mobility: Secondary | ICD-10-CM | POA: Insufficient documentation

## 2023-07-16 DIAGNOSIS — R296 Repeated falls: Secondary | ICD-10-CM | POA: Diagnosis not present

## 2023-07-16 DIAGNOSIS — R2681 Unsteadiness on feet: Secondary | ICD-10-CM | POA: Diagnosis not present

## 2023-07-16 DIAGNOSIS — M25562 Pain in left knee: Secondary | ICD-10-CM | POA: Diagnosis not present

## 2023-07-16 DIAGNOSIS — G8929 Other chronic pain: Secondary | ICD-10-CM | POA: Diagnosis not present

## 2023-07-16 NOTE — Therapy (Signed)
OUTPATIENT PHYSICAL THERAPY TREATMENT     Patient Name: [name] Pratt MRN: [id number] DOB:March 28, 1941, 82 y.o., male Today's Date: 07/16/2023   END OF SESSION:  PT End of Session - 07/16/23 0848     Visit Number 24    Date for PT Re-Evaluation 08/09/23    Authorization Type [name]    Authorization Time Period 07/02/23 - 08/09/23    Authorization - Visit Number 3    Authorization - Number of Visits 10    Progress Note Due on Visit 31   Recert on visit #21 - 06/28/23   PT Start Time 0848    PT Stop Time 0930    PT Time Calculation (min) 42 min    Activity Tolerance Patient tolerated treatment well    Behavior During Therapy [facility] for tasks assessed/performed                Past Medical History:  Diagnosis Date   Allergic rhinitis 02/02/2016   Anemia 12/17/2009   Formatting of this note might be different from the original. 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 Hypertension   Benign paroxysmal positional vertigo 09/14/2021   Bilateral sacroiliitis (HCC) 07/22/2021   Body mass index (BMI) 25.0-25.9, adult 11/02/2020   Cancer (HCC)    skin cancer   Cardiac murmur 07/25/2021   Carotid stenosis    a. Carotid U/S 5/13: LICA < 50%, RICA 50-69%;  b.  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   Cystoid macular edema of both eyes  07/28/2020   Degenerative disc disease, lumbar 06/30/2015   Deviated nasal septum 02/18/2015   Diverticulosis 03/03/2020   DJD (degenerative joint disease) of hip 12/24/2012   Dyspnea 06/12/2011   Emphysema, unspecified (HCC) 03/05/2020   Erectile dysfunction 06/20/2017   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   Gait disorder 03/02/2021   GERD (gastroesophageal reflux disease)    GOUT 05/13/2009   Qualifier: Diagnosis of  By: Kem Parkinson     Greater trochanteric pain syndrome 07/22/2021   Hand pain, left 01/16/2022   Hearing loss 02/02/2016   Heme positive stool 02/29/2020   HIATAL HERNIA 05/13/2009   Qualifier: Diagnosis of  By: Kem Parkinson     High risk medication use 01/18/2012   Hip pain 03/02/2021   History of chicken pox    History of COVID-19 12/05/2021   History of hemorrhoids    History of kidney stones    History of skin cancer 07/22/2021   skin cancer   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 Cardiology - Dr Estrella Myrtle at [facility] cardiology  ICD-10 cut over   Hypertension    under control  Hypokalemia 07/08/2021   Hypothyroidism    Internal hemorrhoids 03/03/2020   Leg cramps 11/13/2019   Lower GI bleed 12/12/2012   Lumbago of lumbar region with sciatica 10/21/2020   Lumbar radiculopathy 03/02/2021   Lumbar spondylosis 11/02/2020   NEPHROLITHIASIS 05/13/2009   Qualifier: Diagnosis of  By: Kem Parkinson     Nonspecific abnormal electrocardiogram (ECG) (EKG) 01/06/2013   Numbness of hand 01/24/2022   Onychomycosis 06/30/2015   Osteoarthrosis, 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 Osteoarthritis Of The Hand  10/1 IMO update   Osteoarthrosis, unspecified whether generalized  or localized, pelvic region and thigh 07/25/2010   Formatting of this note might be different from the original. Osteoarthritis Of The Hip   Other abnormal glucose 07/22/2021   Other allergic rhinitis 02/18/2015   Palpitations 07/25/2021   Peripheral neuropathy 06/19/2016   Preventative health care 11/24/2015   Right groin pain 11/29/2012   Right inguinal hernia 05/13/2009   Qualifier: Diagnosis of  By: Kem Parkinson     RLS (restless legs syndrome) 02/18/2015   Rosacea    Rotator cuff tear 07/27/2013   Situational depression 03/02/2021   Skin lesion 01/16/2022   Sleep apnea with use of continuous positive airway pressure (CPAP) 07/15/2013   CPAP set to  7 cm water,  Residual AHi was 7.8  With no leak.  User time 6 hours.  479 days , not used only 12 days - highly compliant 06-23-13  .    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 Fawn Kirk   Ulnar neuropathy 01/24/2022   Urinary retention 11/26/2020   Ventral hernia 07/08/2012   Vertigo 07/08/2021   Weight loss 03/03/2020   Past Surgical History:  Procedure Laterality Date   ABDOMINAL HERNIA REPAIR  07/15/12   Dr Ashley Jacobs   ANTERIOR CERVICAL DECOMP/DISCECTOMY FUSION N/A 07/09/2020   Procedure: ANTERIOR CERVICAL DECOMPRESSION AND FUSION CERVICAL THREE-FOUR.;  Surgeon: Donalee Citrin, MD;  Location: [facility] OR;  Service: Neurosurgery;  Laterality: N/A;  anterior   BROW LIFT  05/07/01   COLONOSCOPY WITH PROPOFOL N/A 04/24/2022   Procedure: COLONOSCOPY WITH PROPOFOL;  Surgeon: Tressia Danas, MD;  Location: [facility] ENDOSCOPY;  Service: Gastroenterology;  Laterality: N/A;   COLONOSCOPY WITH PROPOFOL N/A 04/26/2022   Procedure: COLONOSCOPY WITH PROPOFOL;   Surgeon: Tressia Danas, MD;  Location: [facility] ENDOSCOPY;  Service: Gastroenterology;  Laterality: N/A;   CYSTOSCOPY WITH URETEROSCOPY AND STENT PLACEMENT Right 01/28/2014   Procedure: CYSTOSCOPY WITH URETEROSCOPY, BASKET RETRIVAL AND  STENT PLACEMENT;  Surgeon: Valetta Fuller, MD;  Location: [facility] ORS;  Service: Urology;  Laterality: Right;   epidural injections     multiple procedures   ESOPHAGEAL DILATION  1993 and 1994   multiple times   EYE SURGERY Bilateral 03/25/10, 2012   cataract removal   HEMORRHOID SURGERY  2014   HIATAL HERNIA REPAIR  12/21/93   HOLMIUM LASER APPLICATION Right 01/28/2014   Procedure: HOLMIUM LASER APPLICATION;  Surgeon: Valetta Fuller, MD;  Location: [facility] ORS;  Service: Urology;  Laterality: Right;   INGUINAL HERNIA REPAIR Right 07/15/12   Dr Ashley Jacobs, x2   JOINT REPLACEMENT  07/25/04   right knee   JOINT REPLACEMENT  07/13/10  left knee   KNEE SURGERY  1995   LAPAROSCOPIC CHOLECYSTECTOMY  09/20/06   with intraoperative cholangiogram and right inguinal herniorrhaphy with mesh    LIGAMENT REPAIR Left 07/2013   shoulder   LITHOTRIPSY     x2   LUMBAR LAMINECTOMY/DECOMPRESSION MICRODISCECTOMY Bilateral 12/27/2022   Procedure: Lumbar Two-Three, Lumbar Three-Four Sublaminar Decompression;  Surgeon: Donalee Citrin, MD;  Location: [facility] OR;  Service: Neurosurgery;  Laterality: Bilateral;   NASAL SEPTUM SURGERY  1975   POLYPECTOMY  04/26/2022   Procedure: POLYPECTOMY;  Surgeon: Tressia Danas, MD;  Location: [facility] ENDOSCOPY;  Service: Gastroenterology;;   POSTERIOR CERVICAL FUSION/FORAMINOTOMY N/A 11/24/2020   Procedure: Posterior Cervical Laminectomy Cervical three-four, Cervical four-five with lateral mass fusion;  Surgeon: Donalee Citrin, MD;  Location: [facility] OR;  Service: Neurosurgery;  Laterality: N/A;   REFRACTIVE SURGERY Bilateral    Right total hip replacement  4/14   SKIN CANCER DESTRUCTION     nose, ear   TONSILLECTOMY  as child   TOTAL HIP ARTHROPLASTY Left    URETHRAL DILATION   1991   Dr. Annabell Howells   Patient Active Problem List   Diagnosis Date Noted   Spinal stenosis of lumbar region 12/27/2022   Drowsiness 12/07/2022   Preop examination 12/06/2022   Weakness of lower extremity 10/10/2022   Skin tear of right elbow without complication 09/18/2022   Skin tear of forearm without complication, left, subsequent encounter 09/18/2022   Fall 09/18/2022   Iron deficiency anemia 09/18/2022   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   Vitamin D deficiency 08/07/2022   Closed fracture of left hip (HCC) 08/07/2022   Hand weakness 05/02/2022   Acute kidney injury (HCC) 05/02/2022   Adenomatous polyp of ascending colon    Adenomatous polyp of colon    Acute metabolic encephalopathy 04/24/2022   GI bleed 04/21/2022   Vasovagal syncope    Hemorrhagic shock (HCC)    Hematochezia    Irregular heart beat 02/03/2022   Constipation 02/03/2022   Carpal tunnel syndrome 01/24/2022   Numbness of hand 01/24/2022   Ulnar neuropathy 01/24/2022   Skin lesion 01/16/2022   History of COVID-19 12/05/2021   Benign paroxysmal positional vertigo 09/14/2021   Palpitations 07/25/2021   Cardiac murmur 07/25/2021   History of chicken pox 07/22/2021   History of hemorrhoids 07/22/2021   COPD (chronic obstructive pulmonary disease) (HCC) 07/22/2021   History of kidney stones 07/22/2021   History of skin cancer 07/22/2021   Complication of anesthesia 07/22/2021   Bilateral sacroiliitis (HCC) 07/22/2021   Other abnormal glucose 07/22/2021   Spondylitis (HCC) 07/22/2021   Greater trochanteric pain syndrome 07/22/2021   Vertigo 07/08/2021   Hypokalemia 07/08/2021   Trochanteric bursitis of left hip 06/30/2021   Cancer (HCC) 04/11/2021   Lumbar radiculopathy 03/02/2021   Gait instability 03/02/2021   Hip pain 03/02/2021   Situational depression 03/02/2021   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   Urinary  retention 11/26/2020   Cervical myelopathy (HCC) 11/24/2020   Body mass index (BMI) 25.0-25.9, adult 11/02/2020   Lumbar spondylosis 11/02/2020   Lumbago of lumbar region with sciatica 10/21/2020   Cystoid macular edema of both eyes 07/28/2020   Status post cervical spinal fusion 07/09/2020   History of total bilateral knee replacement 07/08/2020   Aortic atherosclerosis (HCC) 03/05/2020   Emphysema, unspecified (HCC) 03/05/2020   Arthritis 03/04/2020   Diverticulosis 03/03/2020   Internal hemorrhoids 03/03/2020   Weight loss 03/03/2020   Heme  positive stool 02/29/2020   Leg cramps 11/13/2019   Tibialis anterior tendon tear, nontraumatic 11/13/2019   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 07/29/2018   CPAP (continuous positive airway pressure) dependence 03/25/2018   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   Erectile dysfunction 06/20/2017   Stenosis of right carotid artery without cerebral infarction 03/06/2017   Peripheral neuropathy 06/19/2016   Asymmetric SNHL (sensorineural hearing loss) 02/29/2016   Hearing loss 02/02/2016   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   Allergic rhinitis 02/02/2016   Atypical chest pain 11/24/2015   Preventative health care 11/24/2015   Onychomycosis 06/30/2015   Degenerative disc disease, lumbar 06/30/2015   Other allergic rhinitis 02/18/2015   Deviated nasal septum 02/18/2015   RLS (restless legs syndrome) 02/18/2015   GERD (gastroesophageal reflux disease) 09/18/2014   Hyperglycemia 03/03/2014   Rotator cuff tear 07/27/2013   Sleep apnea with use of continuous positive airway pressure (CPAP) 07/15/2013   Carotid stenosis 02/24/2013   Nonspecific abnormal electrocardiogram (ECG) (EKG) 01/06/2013   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   Lower GI bleed 12/12/2012   Right groin pain 11/29/2012   OSA (obstructive sleep apnea) 11/25/2012   Ventral hernia 07/08/2012   High risk medication use 01/18/2012   Dyspnea 06/12/2011   Hyperlipidemia  12/27/2010   Osteoarthrosis, unspecified whether generalized or localized, pelvic region and thigh 07/25/2010   Osteoarthrosis, hand 06/13/2010   Anemia 12/17/2009   Hypothyroidism 05/13/2009   GOUT 05/13/2009   Right inguinal hernia 05/13/2009   HIATAL HERNIA 05/13/2009   NEPHROLITHIASIS 05/13/2009   ROSACEA 05/13/2009   Hypertension 05/13/2009    PCP: [name], NP   REFERRING PROVIDER: Susa Raring., MD   REFERRING DIAG:  M25.562, G89.29 (ICD-10-CM) - Chronic pain in left knee  M25.561, G89.29 (ICD-10-CM) - Chronic pain in right knee   THERAPY DIAG:  Chronic pain of both knees  Muscle weakness (generalized)  Unsteadiness on feet  Other abnormalities of gait and mobility  Repeated falls  Other low back pain  RATIONALE FOR EVALUATION AND TREATMENT: Rehabilitation  ONSET DATE: off and on for a couple years  NEXT MD VISIT: none scheduled   SUBJECTIVE:                                                                                                                                                                                                         SUBJECTIVE STATEMENT: Pt reports he is not adjusting well to the time change. Did not sleep well as his CPAP keep slipping. He denies pain today.  PAIN: Are you  having pain? No - B knees  Are you having pain? No - low back   PERTINENT HISTORY:  L2-3, L3-4 Sublaminar Decompression on 12/27/22; Revision of femoral component of L THA on 07/02/22 s/p fall with periprosthetic fracture; Aortic atherosclerosis; carotid stenosis; cervical myelopathy s/p ACDF 06/2020 and posterior cervical fusion 11/2020; peripheral neuropathy; lumbar DDD/spondylosis; lumbago with sciatica/lumbar radiculopathy; OA; B TKA; B THA; L greater trochanteric pain syndrome; L RCR 2014; CTR; hearing loss; HTN; BPPV/vertigo; B sacroiliitis; skin cancer; COPD/emphysema; sleep apnea with CPAP; GERD; gout; HLD; LE cramps; RLS   PRECAUTIONS:  Fall  WEIGHT BEARING RESTRICTIONS: No  FALLS:  Has patient fallen in last 6 months? Yes. 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  No fracture or bony lesion or other findings.  Impression: Right knee replacement in good condition.   01/31/23 - L knee x-ray: No fracture or bony lesion or arthritic change or other abnormality.  Left  knee replacement looks well-fixed and in excellent position.  Impression: Knee replacement in good condition.   PATIENT SURVEYS:  LEFS 31 / 80 = 38.8 % 05/10/23: 32 / 80 = 40.0 %  COGNITION: Overall cognitive status: Within functional limits for tasks assessed and History of cognitive impairments - at baseline    SENSATION: Intermittent peripheral neuropathy in B feet  MUSCLE LENGTH: TBA Hamstrings:  ITB:  Piriformis:  Hip flexors:  Quads:  Heelcord:   POSTURE:  rounded shoulders, forward head, and flexed trunk   LOWER EXTREMITY ROM: Limited B hip and ankle ROM Active ROM Right eval Left eval Right 04/03/23 Left 04/03/23 R/L 05/28/23 R/L 06/28/23  Knee flexion 104 96 110 108   108/108  Knee extension -10 -5 -4 -2 4 in LAQ/ 1 in LAQ -4/-2 in LAQ 0/0 supported   Passive ROM Right eval Left eval  Knee flexion    Knee extension 0 0  (Blank rows = not tested)  LOWER EXTREMITY MMT:  MMT Right Eval* Left   Eval* R * 05/10/23 L * 05/10/23 R 06/21/23 L 06/21/23 R 07/16/23 L 07/16/23  Hip flexion 3+ 4 3+ 4- 4- 4 4 4+  Hip extension 3- 3+ 3+ 3+ 4 4+ 4 (tested in S/L) 4 (tested in S/L)  Hip abduction 4- 4 4 4 4 4  4- 4-  Hip adduction 4 4 4 4 4  4+ 4- 4-  Hip internal rotation 3+ 4- 3+ 4-   3+ 4-  Hip external rotation 2+ 3- 2+ 3-   3- 3-  Knee flexion 4 4 4+ 4+   4+ 4+  Knee extension 4  4+ 4 4 4  4+ 4+ 5  Ankle dorsiflexion 4+ 4 4+ 4-   4+ 4  Ankle plantarflexion          Ankle inversion          Ankle eversion           (Blank rows = not tested, *tested in sitting)  FUNCTIONAL TESTS: (Remaining tests to be assessed next visit with PT) 5 times sit to stand: 18.94 sec w/o UE assist (uncontrolled descent on most reps) (03/22/23) Timed up and go (TUG): 21.29 sec with RW (03/22/23) 10 meter walk test: 21.13 sec with RW; Gait speed = 1.55 ft/sec  Berg Balance Scale: 35/56; <36 = High risk for falls (close to 100%) (03/22/23) Dynamic Gait Index: 11/24; Scores of 19 or less are predictive of falls in older community living adults (03/22/23)  04/17/23: TUG: 20 sec  5xSTS: 18 sec  05/10/23: 5xSTS: 13.31 sec w/o UE assist TUG: 21.06 with RW, difficulty with turning : 14.25 sec with RW Gait speed: 2.30 ft/sec with RW DGI: 13/24  05/17/23: Sharlene Motts: 32/56  06/14/23: TUG = 17.21 sec - Goal met  06/26/23:  Sharlene Motts: 38/56  06/28/23: : 12.72 sec with RW Gait speed = 2.58 ft/sec with RW DGI: 14/24   GAIT: Distance walked: 60 ft Assistive device utilized: Environmental consultant - 2 wheeled Level of assistance: SBA Gait pattern: step through pattern, decreased stride length, decreased hip/knee flexion- Right, decreased hip/knee flexion- Left, decreased ankle dorsiflexion- Right,  decreased ankle dorsiflexion- Left, trunk flexed, poor foot clearance- Right, and poor foot clearance- Left Comments: Cues necessary for improved upright posture and rolling walker proximity   TODAY'S TREATMENT:   07/16/23  GAIT TRAINING: To normalize gait pattern and improve safety with RW . 270' with collapsible RW - better L foot clearance but continued cues for upright posture and RW proximity  THERAPEUTIC ACTIVITIES: LE MMT - standard testing positions except hip extension tested in S/L due to limited extension ROM  THERAPEUTIC EXERCISE: to improve flexibility, strength and mobility.  Demonstration, verbal and tactile cues throughout for technique. S/L clam 2 x 10 bil S/L reverse clam 2 x 10 bil Standing lunge btw backs of 2 chairs 2 x 10 with each foot fwd   07/09/23  THERAPEUTIC EXERCISE: to improve flexibility, strength and mobility.  Demonstration, verbal and tactile cues throughout for technique. NuStep - L6 x 6 min Standing alt hip ABD 2# x 10, UE support on back of chair Standing alt hip extension 2# x 10, UE support on back of chair Standing alt hip flexion march 2# x 10, UE support on back of chair Seated alt knee extension 2# x 15 Seated alt hip flexion march 2# x 15 Seated GTB clam x 10 (using band with looped "handles" on either wrapped around legs and crossed over on top as pt has difficulty placing looped band around his legs) - replacing hooklying bent-knee fallout in HEP Standing R/L gastroc runner's stretch 2 x 30" Standing heel/toe raises 2 x 10, UE support on back of chair - limited toe clearance/lift on L>R  NEUROMUSCULAR RE-EDUCATION: To improve posture, balance, proprioception, coordination, and reduce fall risk.  Toe clears to 9" step - attempted with 2# ankles weights but unable to reach step with R foot; weights removed but pt still struggling with R foot clearance Standing on Airex pad: Lateral weight shift x 20 with UE support on back of chair, x 10  w/o UE support B heel/toe weight shift (w/o  raise) x 20 with UE support on back of chair, x 10 w/o UE support   07/02/23  THERAPEUTIC EXERCISE: to improve flexibility, strength and mobility.  Demonstration, verbal and tactile cues throughout for technique. NuStep - L6 x 6 min Hip flexion march + ankle DF with looped YTB at midfeet x 20  NEUROMUSCULAR RE-EDUCATION: To improve posture, balance, proprioception, coordination, and reduce fall risk. Standing "penguin" lateral weight shift with looped GTB at ankles x 20 Staggered stance "penguin" ant/post weight shift with looped GTB at ankles x 20 with each foot fwd Staggered stance with fwd foot on 2" block Static stance x 30" OH UE raise with side-to-side reach x 5 Toe clears to 9" step x 10 - increased fatigue on L with difficulty clearing toes on last 2 reps   PATIENT EDUCATION:  Education details: continue with current HEP and gait safety with RW   Person educated: Patient Education method: Explanation, Demonstration, and Verbal cues Education comprehension: verbalized understanding, verbal cues required, and needs further education  HOME EXERCISE PROGRAM: Access Code: [email or web address] URL: [email or web address] Date: 06/28/2023 Prepared by: Glenetta Hew  Exercises - Seated Hamstring Stretch with Strap  - 1 x daily - 7 x weekly - 3 sets - 30 sec hold - Seated Quad Set  - 1 x daily - 7 x weekly - 3 sets - 10 reps - 5 seconds hold - Seated Hip Adduction Squeeze with Ball  - 1 x daily - 7 x weekly - 3 sets - 10 reps - 5 secs hold - Supine Quad Set on Towel Roll  - 1 x daily - 7 x weekly - 2 sets - 10 reps - 3 sec hold - Standing Gastroc Stretch at Counter  - 1 x daily - 7 x weekly - 3 sets - 3 reps - 30 sec hold - Seated Shoulder Horizontal Abduction with Resistance - Palms Down  - 1 x daily - 7 x weekly - 3 sets - 10 reps - Seated Anti-Rotation Press With Anchored Resistance  - 1 x daily - 7 x weekly - 3 sets - 10 reps -  Standing Hip Abduction with Counter Support  - 1 x daily - 3 x weekly - 2 sets - 10 reps - Standing Hip Extension with Counter Support  - 1 x daily - 3 x weekly - 2 sets - 10 reps - Standing Knee Flexion with Counter Support  - 1 x daily - 3 x weekly - 2 sets - 10 reps - Seated Long Arc Quad  - 1 x daily - 3 x weekly - 2 sets - 10 reps - Seated March  - 1 x daily - 3 x weekly - 2 sets - 10 reps - Seated Hip Abduction with Resistance  - 1 x daily - 3 x weekly - 2 sets - 10 reps - 3-5 sec hold - Squat with Chair and Counter Support  - 1 x daily - 3 x weekly - 2 sets - 10 reps - 3 sec hold   ASSESSMENT:  CLINICAL IMPRESSION: [place]" demonstrates better L foot clearance during gait today but continues to require cues for upright posture and RW proximity. Strength testing completed in standard testing positions except hip extension tested in S/L due to limited extension ROM with MMT revealing more proximal weakness when gravity resistance factored in, especially with hip rotation.  Strengthening targeting areas of weakness identified today as well as more isolated unilateral CKC strengthening.  [name] will benefit from continued  skilled PT to address ongoing deficits including improving standing balance for functional activities at home as well as LE strength to improve stability with ambulation to decrease risk for future falls.   OBJECTIVE IMPAIRMENTS: Abnormal gait, decreased activity tolerance, decreased balance, decreased coordination, decreased endurance, decreased knowledge of condition, decreased knowledge of use of DME, decreased mobility, difficulty walking, decreased ROM, decreased strength, decreased safety awareness, increased fascial restrictions, impaired perceived functional ability, increased muscle spasms, impaired flexibility, impaired sensation, improper body mechanics, postural dysfunction, and pain.   ACTIVITY LIMITATIONS: carrying, lifting, bending, sitting, standing,  squatting, sleeping, stairs, transfers, bed mobility, bathing, locomotion level, and caring for others  PARTICIPATION LIMITATIONS: meal prep, cleaning, laundry, driving, shopping, and community activity  PERSONAL FACTORS: Age, Behavior pattern, Fitness, Past/current experiences, Time since onset of injury/illness/exacerbation, and 3+ comorbidities: L2-3, L3-4 Sublaminar Decompression on 12/27/22; Revision of femoral component of L THA on 07/02/22 s/p fall with periprosthetic fracture; Aortic atherosclerosis; carotid stenosis; 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   REHAB POTENTIAL: Good  CLINICAL DECISION MAKING: Evolving/moderate complexity  EVALUATION COMPLEXITY: Moderate   GOALS: Goals reviewed with patient? Yes  SHORT TERM GOALS: Target date: 04/10/2023, extended to 06/07/2023  Patient will be independent with initial HEP. Baseline:  Goal status: MET   2.  Complete balance testing and establish additional STG's/LTG's as appropriate. Baseline:  Goal status: MET  03/22/23  3.  Patient will improve 5x STS time to </= 16 seconds with good eccentric control for improved efficiency and safety with transfers Baseline: 18.94 sec w/o UE assist (uncontrolled descent on most reps) Goal status: MET  05/10/23 - 13.31 sec w/o UE assist with good stability upon standing and improved eccentric control  4.  Patient will demonstrate decreased TUG time to </= 18 sec to decrease risk for falls with transitional mobility Baseline: 21.29 sec with RW; 04/17/23 - 20 sec with RW Goal status: MET 06/14/23 - 17.21 sec  LONG TERM GOALS:  Target date: 05/08/2023, extended to 07/05/2023, extended to 08/09/2023  Patient will be independent with advanced/ongoing HEP to improve outcomes and carryover.  Baseline:  Goal status: IN PROGRESS  06/28/23 - met for current HEP with exceptions of modifications made today  2.  Patient will report at least 50-75% improvement in B knee pain with no further instances of subluxation to improve QOL. Baseline: 5/10 Goal status: MET  05/10/23 - no recent knee pain reported  3.  Patient will demonstrate improved B knee extension AROM to [facility] to allow for improved stability for normal gait and stair mechanics. Baseline: Refer to above LE ROM table Goal status: MET  06/28/23 - knee ROM has plateaued but adequate for normal mobility, gait and stair negotiation  4.  Patient will demonstrate improved B LE strength to >/= 4 to 4+/5 for improved stability and ease of mobility. Baseline: Refer to above LE MMT table Goal status: PARTIALLY MET  07/16/23 - proximal LE strength gradually improving  5.  Patient will be able to ambulate 400' with LRAD and normal gait pattern without increased pain to access community.  Baseline:  Goal status: IN PROGRESS  06/26/23 - 400' with RW and frequent cues for upright posture and improved  RW proximity'; increased L hip flexion during swing phase, halfway through decreased overall gait speed and decreased stride length  6. Patient will be able to ascend/descend stairs with 1 HR and reciprocal step pattern safely to access home and community.  Baseline:  Goal status: IN PROGRESS  06/28/23 - able to complete reciprocal ascent with right HR but fatigues w/o L UE support  (reports he uses cane for other hand at home), step to pattern on descent with continued need for CGA of PT  7.  Patient will report >/= 40/80 on LEFS to demonstrate improved functional ability. Baseline: 31 / 80 = 38.8 %; 05/10/23 - 32 / 80 = 40.0 % Goal status: MET  06/28/23 -  40 / 80 = 50.0 %  8.   Patient will demonstrate at least 19/24 on DGI to decrease risk of falls. Baseline: 11/24 (03/22/23);  13/24 (05/10/23)  Goal status: IN PROGRESS  06/28/23 - 14/24  9.  Patient will improve Berg score by at least 8 points to improve safety and stability with ADLs in standing and reduce risk for falls. Baseline: 35/56 (03/22/23);  32/56 (05/17/23) Goal status: IN PROGRESS  06/26/23 - 38/56   PLAN:  PT FREQUENCY: 2x/week  PT DURATION: 4-6 weeks  PLANNED INTERVENTIONS: 16109- PT Re-evaluation, 97110-Therapeutic exercises, 97530- Therapeutic activity, 97112- Neuromuscular re-education, 97535- Self Care, 60454- Manual therapy, [phone]- Gait training, [phone]- Aquatic Therapy, [id number]- Electrical stimulation (unattended), 97035- Ultrasound, Patient/Family education, Balance training, Stair training, Taping, Dry Needling, Joint mobilization, Spinal mobilization, DME instructions, Cryotherapy, Moist heat, Therapeutic exercises, Therapeutic activity, Neuromuscular re-education, Gait training, and Self Care  PLAN FOR NEXT SESSION: core/lumbopelvic/B LE flexibility and strengthening; balance training; review and update/progress HEP as indicated; if forward flexed posture with gait persists, consider transition to standard RW   Marry Guan, PT 07/16/2023, 5:03 PM

## 2023-07-17 ENCOUNTER — Ambulatory Visit (INDEPENDENT_AMBULATORY_CARE_PROVIDER_SITE_OTHER): Payer: Medicare HMO | Admitting: Family

## 2023-07-17 ENCOUNTER — Other Ambulatory Visit (HOSPITAL_BASED_OUTPATIENT_CLINIC_OR_DEPARTMENT_OTHER): Payer: Self-pay

## 2023-07-17 VITALS — BP 128/52 | HR 68 | Temp 98.8°F | Resp 16 | Ht 66.0 in | Wt 162.0 lb

## 2023-07-17 DIAGNOSIS — R739 Hyperglycemia, unspecified: Secondary | ICD-10-CM | POA: Diagnosis not present

## 2023-07-17 DIAGNOSIS — E039 Hypothyroidism, unspecified: Secondary | ICD-10-CM | POA: Diagnosis not present

## 2023-07-17 DIAGNOSIS — M5416 Radiculopathy, lumbar region: Secondary | ICD-10-CM

## 2023-07-17 DIAGNOSIS — I10 Essential (primary) hypertension: Secondary | ICD-10-CM | POA: Diagnosis not present

## 2023-07-17 MED ORDER — AMLODIPINE BESYLATE 2.5 MG PO TABS
2.5000 mg | ORAL_TABLET | Freq: Two times a day (BID) | ORAL | 1 refills | Status: DC
Start: 2023-07-17 — End: 2023-09-26

## 2023-07-17 MED ORDER — GABAPENTIN 300 MG PO CAPS: 300.0000 mg | ORAL_CAPSULE | Freq: Every day | ORAL | 1 refills | Status: DC

## 2023-07-17 MED ORDER — LEVOTHYROXINE SODIUM 75 MCG PO TABS
ORAL_TABLET | ORAL | 1 refills | Status: DC
Start: 2023-07-17 — End: 2023-09-27

## 2023-07-17 MED ORDER — HYDRALAZINE HCL 50 MG PO TABS: 50.0000 mg | ORAL_TABLET | Freq: Three times a day (TID) | ORAL | 0 refills | Status: DC

## 2023-07-17 MED ORDER — COMIRNATY 30 MCG/0.3ML IM SUSY
0.3000 mL | PREFILLED_SYRINGE | INTRAMUSCULAR | 0 refills | Status: AC
  Filled 2023-07-17: qty 0.3, 1d supply, fill #0

## 2023-07-17 MED ORDER — ALLOPURINOL 100 MG PO TABS: 100.0000 mg | ORAL_TABLET | Freq: Two times a day (BID) | ORAL | 1 refills | Status: DC

## 2023-07-17 MED ORDER — ROSUVASTATIN CALCIUM 5 MG PO TABS: 5.0000 mg | ORAL_TABLET | Freq: Every day | ORAL | 1 refills | Status: DC

## 2023-07-17 NOTE — Progress Notes (Signed)
Subjective:     Patient ID: [name] Pratt, male    DOB: 04/20/1941, 82 y.o.   MRN: [id number]  Chief Complaint  Patient presents with   Hypertension    Patient reports low blood pressure readings at home    Hypertension    Discussed the use of AI scribe software for clinical note transcription with the patient, who gave verbal consent to proceed.  History of Present Illness   The patient, with a history of hypertension, presents for a follow-up visit. Last visit we increased his amlodipine from 2.5mg  once daily to bid.  He has been taking amlodipine 2.5mg  twice a day, valsartan, and hydralazine. Home readings of his blood pressure range from 123 to 140 systolic and 61 to 71 diastolic.  The patient also mentions that he has been sleeping well, which he attributes to his gabapentin medication, which he takes at night due to its drowsiness side effect. He also reports that recent shots in his back have improved his sleep, bladder control, and bowel movements.  He has been experiencing pain in his wrist and elbow, which he manages with an over-the-counter lidocaine product. He also reports a lack of blood flow in his hands in the morning.  The patient is also on levothyroxine for thyroid management, taking 150mg  six days a week and 75mg  one day a week.          Health Maintenance Due  Topic Date Due   FOOT EXAM  09/29/2021   Medicare Annual Wellness (AWV)  01/20/2023   Diabetic kidney evaluation - Urine ACR  03/16/2023   COVID-19 Vaccine (5 - 2023-24 season) 05/13/2023    Past Medical History:  Diagnosis Date   Allergic rhinitis 02/02/2016   Anemia 12/17/2009   Formatting of this note might be different from the original. 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 Hypertension   Benign paroxysmal positional vertigo 09/14/2021   Bilateral sacroiliitis (HCC) 07/22/2021   Body mass index (BMI) 25.0-25.9, adult 11/02/2020   Cancer (HCC)    skin cancer   Cardiac murmur 07/25/2021   Carotid stenosis    a. Carotid U/S 5/13: LICA < 50%, RICA 50-69%;  b.  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   Cystoid macular edema of both eyes 07/28/2020   Degenerative disc disease, lumbar 06/30/2015   Deviated nasal septum 02/18/2015   Diverticulosis 03/03/2020   DJD (degenerative joint disease) of hip 12/24/2012   Dyspnea 06/12/2011   Emphysema, unspecified (HCC) 03/05/2020   Erectile dysfunction 06/20/2017   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   Gait disorder 03/02/2021   GERD (gastroesophageal reflux disease)    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   Qualifier: Diagnosis of  By: Kem Parkinson     Greater trochanteric pain syndrome 07/22/2021   Hand pain, left 01/16/2022  Hearing loss 02/02/2016   Heme positive stool 02/29/2020   HIATAL HERNIA 05/13/2009   Qualifier: Diagnosis of  By: Kem Parkinson     High risk medication use 01/18/2012   Hip pain 03/02/2021   History of chicken pox    History of COVID-19 12/05/2021   History of hemorrhoids    History of kidney stones    History of skin cancer 07/22/2021   skin cancer   History of total bilateral knee replacement 07/08/2020   Hyperglycemia 03/03/2014   Hyperlipidemia with target LDL less than 130 12/27/2010    Formatting of this note might be different from the original. 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 Osteoarthritis Of The Hand  10/1 IMO update   Osteoarthrosis, unspecified whether generalized or localized, pelvic region and thigh 07/25/2010   Formatting of this note might be different from the original. Osteoarthritis Of The Hip   Other abnormal glucose 07/22/2021   Other allergic rhinitis 02/18/2015   Palpitations 07/25/2021   Peripheral neuropathy 06/19/2016   Preventative health care 11/24/2015   Right groin pain 11/29/2012   Right inguinal hernia 05/13/2009   Qualifier: Diagnosis of  By: Kem Parkinson     RLS (restless legs syndrome) 02/18/2015   Rosacea    Rotator cuff tear 07/27/2013   Situational depression 03/02/2021   Skin lesion 01/16/2022   Sleep apnea with use of continuous positive airway pressure (CPAP) 07/15/2013   CPAP set to  7 cm water,  Residual AHi was 7.8  With no leak.  User time 6 hours.  479 days , not used only 12 days - highly compliant 06-23-13  .    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 Fawn Kirk   Ulnar neuropathy 01/24/2022   Urinary retention 11/26/2020   Ventral hernia 07/08/2012   Vertigo 07/08/2021   Weight loss 03/03/2020    Past Surgical History:  Procedure Laterality Date   ABDOMINAL HERNIA REPAIR  07/15/12   Dr Ashley Jacobs   ANTERIOR CERVICAL DECOMP/DISCECTOMY FUSION N/A 07/09/2020   Procedure: ANTERIOR CERVICAL DECOMPRESSION AND FUSION CERVICAL THREE-FOUR.;  Surgeon: Donalee Citrin, MD;  Location: [facility] OR;  Service: Neurosurgery;  Laterality: N/A;  anterior   BROW LIFT  05/07/01   COLONOSCOPY WITH PROPOFOL N/A 04/24/2022   Procedure: COLONOSCOPY WITH PROPOFOL;  Surgeon: Tressia Danas, MD;  Location[facility] ENDOSCOPY;  Service: Gastroenterology;  Laterality: N/A;   COLONOSCOPY WITH PROPOFOL N/A 04/26/2022   Procedure: COLONOSCOPY WITH PROPOFOL;  Surgeon: Tressia Danas, MD;  Location: [facility] ENDOSCOPY;  Service: Gastroenterology;  Laterality: N/A;   CYSTOSCOPY WITH URETEROSCOPY AND STENT PLACEMENT Right 01/28/2014  Procedure: CYSTOSCOPY WITH URETEROSCOPY, BASKET RETRIVAL AND  STENT PLACEMENT;  Surgeon: Valetta Fuller, MD;  Location: [facility] ORS;  Service: Urology;  Laterality: Right;   epidural injections     multiple procedures   ESOPHAGEAL DILATION  1993 and 1994   multiple times   EYE SURGERY Bilateral 03/25/10, 2012   cataract removal   HEMORRHOID SURGERY  2014   HIATAL HERNIA REPAIR  12/21/93   HOLMIUM LASER APPLICATION Right 01/28/2014   Procedure: HOLMIUM LASER APPLICATION;  Surgeon: Valetta Fuller, MD;  Location: [facility] ORS;  Service: Urology;  Laterality: Right;   INGUINAL HERNIA REPAIR Right 07/15/12   Dr Ashley Jacobs, x2   JOINT REPLACEMENT  07/25/04   right knee   JOINT REPLACEMENT  07/13/10   left knee   KNEE SURGERY  1995   LAPAROSCOPIC CHOLECYSTECTOMY  09/20/06   with intraoperative cholangiogram and right inguinal herniorrhaphy with mesh    LIGAMENT REPAIR Left 07/2013   shoulder   LITHOTRIPSY     x2   LUMBAR LAMINECTOMY/DECOMPRESSION  MICRODISCECTOMY Bilateral 12/27/2022   Procedure: Lumbar Two-Three, Lumbar Three-Four Sublaminar Decompression;  Surgeon: Donalee Citrin, MD;  Location: [facility] OR;  Service: Neurosurgery;  Laterality: Bilateral;   NASAL SEPTUM SURGERY  1975   POLYPECTOMY  04/26/2022   Procedure: POLYPECTOMY;  Surgeon: Tressia Danas, MD;  Location: [facility] ENDOSCOPY;  Service: Gastroenterology;;   POSTERIOR CERVICAL FUSION/FORAMINOTOMY N/A 11/24/2020   Procedure: Posterior Cervical Laminectomy Cervical three-four, Cervical four-five with lateral mass fusion;  Surgeon: Donalee Citrin, MD;  Location: [facility] OR;  Service: Neurosurgery;  Laterality: N/A;   REFRACTIVE SURGERY Bilateral    Right total hip replacement  4/14   SKIN CANCER DESTRUCTION     nose, ear   TONSILLECTOMY  as child   TOTAL HIP ARTHROPLASTY Left    URETHRAL DILATION  1991   Dr. 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   2 grandchildren   Has worked for Programmer, systems   Enjoys wood working   Completed HS.  [employer]   Social Determinants of Health   Financial Resource Strain: Low Risk  (10/28/2021)   Overall Financial Resource Strain (CARDIA)    Difficulty of Paying Living Expenses:  Not very hard  Food Insecurity: Unknown (06/05/2022)   Hunger Vital Sign    Worried About Running Out of Food in the Last Year: Never true    Ran Out of Food in the Last Year: Not on file  Transportation Needs: Unknown (06/05/2022)   PRAPARE - Administrator, Civil Service (Medical): No    Lack of Transportation (Non-Medical): Not on file  Physical Activity: Insufficiently Active (06/04/2023)   Exercise Vital Sign    Days of Exercise per Week: 1 day    Minutes of Exercise per Session: 10 min  Stress: Stress Concern Present (06/04/2023)   [employer] of Occupational Health - Occupational Stress Questionnaire    Feeling of Stress : To some extent  Social Connections: Moderately Isolated (01/09/2023)   Social Connection and Isolation Panel [NHANES]    Frequency of Communication with Friends and Family: Three times a week    Frequency of Social Gatherings with Friends and Family: Three times a week    Attends Religious Services: Never    Active Member of Clubs or Organizations: No    Attends Banker Meetings: Never    Marital Status: Married  Catering manager Violence: Not At Risk (01/19/2022)   Humiliation, Afraid, Rape, and Kick questionnaire    Fear of Current or Ex-Partner: No    Emotionally Abused: No    Physically Abused: No    Sexually Abused: No    Outpatient Medications Prior to Visit  Medication Sig Dispense Refill   acetaminophen (TYLENOL) 500 MG tablet Take 500 mg by mouth 2 (two) times daily.     albuterol (VENTOLIN HFA) 108 (90 Base) MCG/ACT inhaler INHALE 2 PUFFS INTO THE LUNGS EVERY 6 HOURS AS NEEDED FOR SHORTNESS OF BREATH. (Patient taking differently: Inhale 2 puffs into the lungs every 6 (six) hours as  needed for shortness of breath.) 1 each 3   antiseptic oral rinse (BIOTENE) LIQD 15 mLs by Mouth Rinse route 2 (two) times daily.     aspirin EC 81 MG tablet Take 81 mg by mouth at bedtime.     bacitracin ointment Apply 1 Application topically 2 (two) times daily. (Patient taking differently: Apply 1 Application topically daily as needed for wound care.) 120 g 0   cholestyramine (QUESTRAN) 4 GM/DOSE powder Take by mouth daily. 30 ml     cycloSPORINE (RESTASIS) 0.05 % ophthalmic emulsion Place 1 drop into both eyes 2 (two) times daily.     docusate sodium (COLACE) 100 MG capsule Take 1 capsule (100 mg total) by mouth every 12 (twelve) hours. 60 capsule 0   fluticasone (FLONASE) 50 MCG/ACT nasal spray Place 1 spray into both nostrils daily as needed for allergies.     GEMTESA 75 MG TABS Take 75 mg by mouth daily.     glucose blood test strip TRUE Metrix Blood Glucose Test Strip, use as instructed 100 each 12   Iron, Ferrous Sulfate, 325 (65 Fe) MG TABS Take 325 mg by mouth every other day. (Patient taking differently: Take 325 mg by mouth daily.) 30 tablet 0   Lancets MISC 1 each by Does not apply route 2 (two) times daily. TRUE matrix lancets 100 each 3   levothyroxine (SYNTHROID) 150 MCG tablet TAKE 1 TABLET ON 6 DAYS PER WEEK (TAKE 75 MCG ONE TIME PER WEEK) 78 tablet 3   loratadine (CLARITIN) 10 MG tablet Take 10 mg by mouth daily as needed for allergies or rhinitis.     Magnesium 500  MG TABS Take 500 mg by mouth daily.     Menthol, Topical Analgesic, (ICY HOT EX) Apply 1 Application topically at bedtime. Lidocaine / on back     metroNIDAZOLE (METROCREAM) 0.75 % cream Apply 1 Application topically daily as needed (head).     Multiple Vitamins-Minerals (MULTIVITAMIN WITH MINERALS) tablet Take 1 tablet by mouth daily.     multivitamin-lutein (OCUVITE-LUTEIN) CAPS capsule Take 1 capsule by mouth daily.     nitroGLYCERIN (NITROSTAT) 0.4 MG SL tablet Place 1 tablet (0.4 mg total) under the tongue  every 5 (five) minutes as needed for chest pain. 25 tablet 6   OVER THE COUNTER MEDICATION Place 1 spray into both nostrils 2 (two) times daily. Sinus saline     OVER THE COUNTER MEDICATION Take 15 mLs by mouth at bedtime. Yellow Mustard     polyethylene glycol powder (GLYCOLAX/MIRALAX) 17 GM/SCOOP powder Take 17 g by mouth 2 (two) times daily as needed for mild constipation, moderate constipation or severe constipation. 255 g 0   polyvinyl alcohol (ARTIFICIAL TEARS) 1.4 % ophthalmic solution Place 1 drop into both eyes daily as needed for dry eyes.     PREBIOTIC PRODUCT PO Take 1 tablet by mouth daily.     PRESCRIPTION MEDICATION CPAP- At bedtime and during any time of rest     Probiotic Product (PROBIOTIC DAILY PO) Take 1 capsule by mouth daily as needed (Constipation).     valsartan (DIOVAN) 320 MG tablet Take 1 tablet (320 mg total) by mouth daily. 90 tablet 1   vitamin B-12 (CYANOCOBALAMIN) 500 MCG tablet Take 500 mcg by mouth in the morning.     allopurinol (ZYLOPRIM) 100 MG tablet TAKE 1 TABLET (100 MG TOTAL) BY MOUTH 2 (TWO) TIMES DAILY. 180 tablet 1   amLODipine (NORVASC) 2.5 MG tablet Take 1 tablet (2.5 mg total) by mouth 2 (two) times daily. HOLD AMLODIPINE DISE IF BLOOD PRESSURE LESS THAN 110/50 180 tablet 1   gabapentin (NEURONTIN) 300 MG capsule TAKE 1 CAPSULE TWO TO THREE TIMES DAILY AS NEEDED 180 capsule 0   hydrALAZINE (APRESOLINE) 50 MG tablet Take 1 tablet (50 mg total) by mouth 3 (three) times daily. 270 tablet 0   levothyroxine (SYNTHROID) 75 MCG tablet Take 75 mcg once a week (patient on 150 mcg 6 days a week) Dispense #24 for 90 day supply. (Patient taking differently: Take 75 mcg once a week (patient on 150 mcg 6 days a week) Dispense #24 for 90 day supply. Sunday) 12 tablet 1   rosuvastatin (CRESTOR) 5 MG tablet TAKE 1 TABLET EVERY DAY 90 tablet 1   No facility-administered medications prior to visit.    Allergies  Allergen Reactions   Pravastatin Other (See Comments)     Muscle cramps   Sildenafil Other (See Comments)    Tachycardia     Oxycodone Other (See Comments)    Hallucinations    Atenolol Other (See Comments)    Bradycardia    Metoclopramide Hcl Anxiety    ROS See HPI    Objective:    Physical Exam Constitutional:      General: He is not in acute distress.    Appearance: He is well-developed.  HENT:     Head: Normocephalic and atraumatic.  Cardiovascular:     Rate and Rhythm: Normal rate and regular rhythm.     Heart sounds: No murmur heard. Pulmonary:     Effort: Pulmonary effort is normal. No respiratory distress.     Breath sounds: Normal  breath sounds. No wheezing or rales.  Skin:    General: Skin is warm and dry.  Neurological:     Mental Status: He is alert and oriented to person, place, and time.  Psychiatric:        Behavior: Behavior normal.        Thought Content: Thought content normal.      BP (!) 128/52   Pulse 68   Temp 98.8 F (37.1 C) (Oral)   Resp 16   Ht 5\' 6"  (1.676 m)   Wt 162 lb (73.5 kg)   SpO2 98%   BMI 26.15 kg/m  Wt Readings from Last 3 Encounters:  07/17/23 162 lb (73.5 kg)  07/02/23 159 lb (72.1 kg)  05/25/23 155 lb (70.3 kg)       Assessment & Plan:   Problem List Items Addressed This Visit       Unprioritized   Lumbar radiculopathy     Reports improvement in sleep and pain control with gabapentin and recent back injections. -Continue gabapentin at bedtime as needed for pain control. -Refill gabapentin prescription.      Relevant Medications   gabapentin (NEURONTIN) 300 MG capsule   Hypothyroidism     Stable on current regimen of levothyroxine six days a week and one day a week. -Refill levothyroxine prescription. Lab Results  Component Value Date   TSH 4.85 01/22/2023         Relevant Medications   levothyroxine (SYNTHROID) 75 MCG tablet   Hypertension     Blood pressure readings at home are within normal range after increasing  amlodipine to 2.5mg  twice daily. No symptoms of hypotension reported. -Continue current regimen of amlodipine 2.5mg  twice daily, valsartan, and hydralazine. -Refill amlodipine prescription.      Relevant Medications   amLODipine (NORVASC) 2.5 MG tablet   hydrALAZINE (APRESOLINE) 50 MG tablet   rosuvastatin (CRESTOR) 5 MG tablet   Hyperglycemia - Primary   Relevant Orders   Urine Microalbumin w/creat. ratio    I have changed Margarito B. Cradle "[name]"'s gabapentin and rosuvastatin. I am also having him maintain his cycloSPORINE, aspirin EC, glucose blood, Lancets, nitroGLYCERIN, Magnesium, albuterol, Gemtesa, PRESCRIPTION MEDICATION, vitamin B-12, loratadine, antiseptic oral rinse, polyvinyl alcohol, Probiotic Product (PROBIOTIC DAILY PO), multivitamin with minerals, Iron (Ferrous Sulfate), bacitracin, PREBIOTIC PRODUCT PO, acetaminophen, fluticasone, (Menthol, Topical Analgesic, (ICY HOT EX)), metroNIDAZOLE, OVER THE COUNTER MEDICATION, cholestyramine, OVER THE COUNTER MEDICATION, multivitamin-lutein, docusate sodium, polyethylene glycol powder, valsartan, levothyroxine, amLODipine, hydrALAZINE, allopurinol, and levothyroxine.  Meds ordered this encounter  Medications   amLODipine (NORVASC) 2.5 MG tablet    Sig: Take 1 tablet (2.5 mg total) by mouth 2 (two) times daily. HOLD AMLODIPINE DISE IF BLOOD PRESSURE LESS THAN 110/50    Dispense:  180 tablet    Refill:  1    Order Specific Question:   Supervising Provider    Answer:   Danise Edge A [[id number]]   hydrALAZINE (APRESOLINE) 50 MG tablet    Sig: Take 1 tablet (50 mg total) by mouth 3 (three) times daily.    Dispense:  270 tablet    Refill:  0    Order Specific Question:   Supervising Provider    Answer:   Danise Edge A [[id number]]   gabapentin (NEURONTIN) 300 MG capsule    Sig: Take 1 capsule (300 mg total) by mouth at bedtime.    Dispense:  90 capsule    Refill:  1    Order Specific Question:  Supervising Provider    Answer:    Danise Edge A [[id number]]   allopurinol (ZYLOPRIM) 100 MG tablet    Sig: Take 1 tablet (100 mg total) by mouth 2 (two) times daily.    Dispense:  180 tablet    Refill:  1    Order Specific Question:   Supervising Provider    Answer:   Danise Edge A [[id number]]   rosuvastatin (CRESTOR) 5 MG tablet    Sig: Take 1 tablet (5 mg total) by mouth daily.    Dispense:  90 tablet    Refill:  1    Order Specific Question:   Supervising Provider    Answer:   Danise Edge A [[id number]]   levothyroxine (SYNTHROID) 75 MCG tablet    Sig: Take 75 mcg once a week (patient on 150 mcg 6 days a week) Dispense #24 for 90 day supply.    Dispense:  24 tablet    Refill:  1    Order Specific Question:   Supervising Provider    Answer:   Danise Edge A [[id number]]

## 2023-07-17 NOTE — Assessment & Plan Note (Signed)
  Blood pressure readings at home are within normal range after increasing amlodipine to 2.5mg  twice daily. No symptoms of hypotension reported. -Continue current regimen of amlodipine 2.5mg  twice daily, valsartan, and hydralazine. -Refill amlodipine prescription.

## 2023-07-17 NOTE — Patient Instructions (Signed)
VISIT SUMMARY:  During today's visit, we reviewed your blood pressure management, pain control, and thyroid medication. You reported that your blood pressure has been stable, and your sleep and pain have improved with your current medications. We also discussed your general health maintenance and upcoming vaccinations.  YOUR PLAN:  -HYPERTENSION: Hypertension, or high blood pressure, is when the force of the blood against your artery walls is too high. Your blood pressure readings at home are within the normal range after increasing your amlodipine dose. Continue taking amlodipine 2.5mg  twice daily, valsartan, and hydralazine. We have refilled your amlodipine prescription.  -NEUROPATHIC PAIN: Neuropathic pain is pain caused by damage or disease affecting the nervous system. You reported improvement in sleep and pain control with gabapentin and recent back injections. Continue taking gabapentin at bedtime as needed for pain control. We have refilled your gabapentin prescription.  -HYPOTHYROIDISM: Hypothyroidism is a condition where your thyroid gland doesn't produce enough thyroid hormone. Your condition is stable on your current regimen of levothyroxine. Continue taking six days a week and one day a week. We have refilled your levothyroxine prescription.  -GENERAL HEALTH MAINTENANCE: We discussed the importance of routine health checks and vaccinations. Please provide a urine sample for a routine check and consider getting the COVID-19 vaccination at your pharmacy. Schedule a follow-up appointment in three months.  INSTRUCTIONS:  Please provide a urine sample for a routine check. Consider getting the COVID-19 vaccination at your pharmacy. Schedule a follow-up appointment in three months.

## 2023-07-17 NOTE — Assessment & Plan Note (Signed)
  Reports improvement in sleep and pain control with gabapentin and recent back injections. -Continue gabapentin at bedtime as needed for pain control. -Refill gabapentin prescription.

## 2023-07-17 NOTE — Assessment & Plan Note (Signed)
  Stable on current regimen of levothyroxine six days a week and one day a week. -Refill levothyroxine prescription. Lab Results  Component Value Date   TSH 4.85 01/22/2023

## 2023-07-18 DIAGNOSIS — C44722 Squamous cell carcinoma of skin of right lower limb, including hip: Secondary | ICD-10-CM | POA: Diagnosis not present

## 2023-07-18 LAB — MICROALBUMIN / CREATININE URINE RATIO
Creatinine,U: 63.1 mg/dL
Microalb Creat Ratio: 29.9 mg/g (ref 0.0–30.0)
Microalb, Ur: 18.9 mg/dL — ABNORMAL HIGH (ref 0.0–1.9)

## 2023-07-19 ENCOUNTER — Other Ambulatory Visit (HOSPITAL_COMMUNITY): Payer: Self-pay

## 2023-07-19 ENCOUNTER — Other Ambulatory Visit (HOSPITAL_BASED_OUTPATIENT_CLINIC_OR_DEPARTMENT_OTHER): Payer: Self-pay

## 2023-07-19 ENCOUNTER — Telehealth: Payer: Self-pay | Admitting: Family

## 2023-07-23 ENCOUNTER — Telehealth: Payer: Self-pay

## 2023-07-23 ENCOUNTER — Ambulatory Visit: Payer: Self-pay | Admitting: Licensed Clinical Social Worker

## 2023-07-23 NOTE — Patient Instructions (Signed)
Visit Information  Thank you for taking time to visit with me today. Please don't hesitate to contact me if I can be of assistance to you.   Following are the goals we discussed today:   Goals Addressed             This Visit's Progress    Patient is recovering from back surgery, He uses a cane or a walker as needed to help him walk       Interventions:  Spoke with Margette Fast, spouse of client, via phone today about client needs Spoke with Shirl Harris, client today via phone about client needs Discussed program support with RN, LCSW, Pharmacist Discussed client ambulation. He uses a cane or a walker as needed to help him walk. He is recovering from back surgery Used Active Listening techniques Client spoke of kidney monitoring. He said he sees kidney specialist  to help monitor his kidney needs. He spoke of  frequent urination at night  Discussed medication procurement of client Discussed pain issues of client. He spoke of pain in elbows, pain in wrist. He spoke of back pain issue Discussed mood of client. Client mood is stable. No mood issues noted. Client likes to watch TV. Client likes to go out to eat occasionally. Client likes to spend time on computer.  Discussed transport needs. Client is still driving at present Discussed appetite of client. Client said he has good appetite Discussed sleeping issues. He has OSA and uses C-PAP machine to use Provided counseling  support Discussed support from NP , Alma Downs' Sullivan.  He sees  NP in December of 2024.  Discussed support from Margette Fast, spouse. Discussed support of client with his medical providers Encouraged client  or Adante Husser to call LCSW as needed at [phone].for SW support for client         Our next appointment is by telephone on 09/03/23 at 1:30 PM   Please call the care guide team at [phone] if you need to cancel or reschedule your appointment.   If you are experiencing a  Mental Health or Behavioral Health Crisis or need someone to talk to, please go to [facility] [address], [place] [phone])   The patient verbalized understanding of instructions, educational materials, and care plan provided today and DECLINED offer to receive copy of patient instructions, educational materials, and care plan.   The patient has been provided with contact information for the care management team and has been advised to call with any health related questions or concerns.   Kelton Pillar.Gayna Braddy MSW, LCSW Licensed Visual merchandiser [facility] Care Management [phone]

## 2023-07-23 NOTE — Telephone Encounter (Signed)
Initial Comment Caller states he is wanting to know if he is to continue Diovan. He does not know what it is for and if he is to continue he needs a refill. He states there was changes to medication and a high lab results to [facility]. Translation No Nurse Assessment Nurse: Tennis Ship, RN, Clarisse Gouge Date/Time (Eastern Time): 07/22/2023 5:22:15 PM Confirm and document reason for call. If symptomatic, describe symptoms. ---Caller states didn't know if he was supposed to take diovan/valsartin. Seen in office recently, told given hydralazine, gabapentin and amlodipine, but found instructions and states he thinks he is supposed to diovan as well. Please call and clarify med list with patient. Does the patient have any new or worsening symptoms? ---No Please document clinical information provided and list any resource used. ---Caller advised to contact office for clarification tomorrow. Disp. Time Lamount Cohen Time) Disposition Final User 07/22/2023 5:38:00 PM Clinical Call Yes Tennis Ship, RN, Clarisse Gouge Final Disposition 07/22/2023 5:38:00 PM Clinical Call Yes Tennis Ship, RN, Clarisse Gouge

## 2023-07-23 NOTE — Telephone Encounter (Signed)
error 

## 2023-07-23 NOTE — Telephone Encounter (Signed)
Called patient to clarify he needs to take amlodipine, valsartan (diovan) and hydralazine

## 2023-07-23 NOTE — Patient Outreach (Signed)
Care Coordination   Follow Up Visit Note   07/23/2023 Name: GARISON HEYSER MRN: [id number] DOB: 01/24/1941  AXSEL MIKLE is a 82 y.o. year old male who sees [name], NP for primary care. I spoke with  [name] / Margette Fast, spouse of client via  phone today.  What matters to the patients health and wellness today?    Patient is recovering from back surgery, He uses a cane or a walker as needed to help him walk      Goals Addressed             This Visit's Progress    Patient is recovering from back surgery, He uses a cane or a walker as needed to help him walk       Interventions:  Spoke with Margette Fast, spouse of client, via phone today about client needs Spoke with Shirl Harris, client today via phone about client needs Discussed program support with RN, LCSW, Pharmacist Discussed client ambulation. He uses a cane or a walker as needed to help him walk. He is recovering from back surgery Used Active Listening techniques Client spoke of kidney monitoring. He said he sees kidney specialist  to help monitor his kidney needs. He spoke of  frequent urination at night  Discussed medication procurement of client Discussed pain issues of client. He spoke of pain in elbows, pain in wrist. He spoke of back pain issue Discussed mood of client. Client mood is stable. No mood issues noted. Client likes to watch TV. Client likes to go out to eat occasionally. Client likes to spend time on computer.  Discussed transport needs. Client is still driving at present Discussed appetite of client. Client said he has good appetite Discussed sleeping issues. He has OSA and uses C-PAP machine to use Provided counseling  support Discussed support from NP , Alma Downs' Sullivan.  He sees  NP in December of 2024.  Discussed support from Margette Fast, spouse. Discussed support of client with his medical providers Encouraged client  or Brayde Volante  to call LCSW as needed at [phone].for SW support for client         SDOH assessments and interventions completed:  Yes  SDOH Interventions Today    Flowsheet Row Most Recent Value  SDOH Interventions   Physical Activity Interventions Other (Comments)  [client uses a cane or a walker to help him walk]  Stress Interventions Other (Comment)  [client has some stress in managing mobility needs. client has some stress in managing medical needs]        Care Coordination Interventions:  Yes, provided  Interventions Today    Flowsheet Row Most Recent Value  Chronic Disease   Chronic disease during today's visit Other  [spoke with Margette Fast, spouse about client needs. spoke with client about client needs]  General Interventions   General Interventions Discussed/Reviewed General Interventions Discussed, Community Resources  Education Interventions   Education Provided Provided Education  Provided Verbal Education On [employer]  Mental Health Interventions   Mental Health Discussed/Reviewed Coping Strategies  [discussed client needs. discussed coping skills used to manage stress issues]  Nutrition Interventions   Nutrition Discussed/Reviewed Nutrition Discussed  Pharmacy Interventions   Pharmacy Dicussed/Reviewed Pharmacy Topics Discussed  Safety Interventions   Safety Discussed/Reviewed Fall Risk        Follow up plan: Follow up call scheduled for 09/03/23 at 1:30 PM     Encounter Outcome:  Patient Visit Completed   Kelton Pillar.Blessing Ozga  MSW, LCSW Licensed Clinical Social Worker [facility] Care Management [phone]

## 2023-07-24 ENCOUNTER — Ambulatory Visit: Payer: Medicare HMO

## 2023-07-24 DIAGNOSIS — M5459 Other low back pain: Secondary | ICD-10-CM

## 2023-07-24 DIAGNOSIS — R2681 Unsteadiness on feet: Secondary | ICD-10-CM | POA: Diagnosis not present

## 2023-07-24 DIAGNOSIS — G8929 Other chronic pain: Secondary | ICD-10-CM

## 2023-07-24 DIAGNOSIS — R2689 Other abnormalities of gait and mobility: Secondary | ICD-10-CM | POA: Diagnosis not present

## 2023-07-24 DIAGNOSIS — R296 Repeated falls: Secondary | ICD-10-CM | POA: Diagnosis not present

## 2023-07-24 DIAGNOSIS — M25562 Pain in left knee: Secondary | ICD-10-CM | POA: Diagnosis not present

## 2023-07-24 DIAGNOSIS — M6281 Muscle weakness (generalized): Secondary | ICD-10-CM

## 2023-07-24 DIAGNOSIS — M25561 Pain in right knee: Secondary | ICD-10-CM | POA: Diagnosis not present

## 2023-07-24 NOTE — Therapy (Signed)
OUTPATIENT PHYSICAL THERAPY TREATMENT     Patient Name: [name] Pratt MRN: [id number] DOB:[birth year], 82 y.o., male Today's Date: 07/24/2023   END OF SESSION:  PT End of Session - 07/24/23 1419     Visit Number 25    Date for PT Re-Evaluation 08/09/23    Authorization Type [name]    Authorization Time Period 07/02/23 - 08/09/23    Authorization - Number of Visits 10    Progress Note Due on Visit 31   Recert on visit #21 - 06/28/23   PT Start Time 1402    PT Stop Time 1444    PT Time Calculation (min) 42 min    Activity Tolerance Patient tolerated treatment well    Behavior During Therapy [facility] for tasks assessed/performed                 Past Medical History:  Diagnosis Date   Allergic rhinitis 02/02/2016   Anemia 12/17/2009   Formatting of this note might be different from the original. 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 Hypertension   Benign paroxysmal positional vertigo 09/14/2021   Bilateral sacroiliitis (HCC) 07/22/2021   Body mass index (BMI) 25.0-25.9, adult 11/02/2020   Cancer (HCC)    skin cancer   Cardiac murmur 07/25/2021   Carotid stenosis    a. Carotid U/S 5/13: LICA < 50%, RICA 50-69%;  b.  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   Cystoid macular edema of both eyes 07/28/2020   Degenerative disc  disease, lumbar 06/30/2015   Deviated nasal septum 02/18/2015   Diverticulosis 03/03/2020   DJD (degenerative joint disease) of hip 12/24/2012   Dyspnea 06/12/2011   Emphysema, unspecified (HCC) 03/05/2020   Erectile dysfunction 06/20/2017   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   Gait disorder 03/02/2021   GERD (gastroesophageal reflux disease)    GOUT 05/13/2009   Qualifier: Diagnosis of  By: Kem Parkinson     Greater trochanteric pain syndrome 07/22/2021   Hand pain, left 01/16/2022   Hearing loss 02/02/2016   Heme positive stool 02/29/2020   HIATAL HERNIA 05/13/2009   Qualifier: Diagnosis of  By: Kem Parkinson     High risk medication use 01/18/2012   Hip pain 03/02/2021   History of chicken pox    History of COVID-19 12/05/2021   History of hemorrhoids    History of kidney stones    History of skin cancer 07/22/2021   skin cancer   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 Cardiology - Dr Estrella Myrtle at [facility] cardiology  ICD-10 cut over   Hypertension    under control   Hypokalemia 07/08/2021   Hypothyroidism  Internal hemorrhoids 03/03/2020   Leg cramps 11/13/2019   Lower GI bleed 12/12/2012   Lumbago of lumbar region with sciatica 10/21/2020   Lumbar radiculopathy 03/02/2021   Lumbar spondylosis 11/02/2020   NEPHROLITHIASIS 05/13/2009   Qualifier: Diagnosis of  By: Kem Parkinson     Nonspecific abnormal electrocardiogram (ECG) (EKG) 01/06/2013   Numbness of hand 01/24/2022   Onychomycosis 06/30/2015   Osteoarthrosis, 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 Osteoarthritis Of The Hand  10/1 IMO update   Osteoarthrosis, unspecified whether generalized or localized, pelvic region and  thigh 07/25/2010   Formatting of this note might be different from the original. Osteoarthritis Of The Hip   Other abnormal glucose 07/22/2021   Other allergic rhinitis 02/18/2015   Palpitations 07/25/2021   Peripheral neuropathy 06/19/2016   Preventative health care 11/24/2015   Right groin pain 11/29/2012   Right inguinal hernia 05/13/2009   Qualifier: Diagnosis of  By: Kem Parkinson     RLS (restless legs syndrome) 02/18/2015   Rosacea    Rotator cuff tear 07/27/2013   Situational depression 03/02/2021   Skin lesion 01/16/2022   Sleep apnea with use of continuous positive airway pressure (CPAP) 07/15/2013   CPAP set to  7 cm water,  Residual AHi was 7.8  With no leak.  User time 6 hours.  479 days , not used only 12 days - highly compliant 06-23-13  .    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 Fawn Kirk   Ulnar neuropathy 01/24/2022   Urinary retention 11/26/2020   Ventral hernia 07/08/2012   Vertigo 07/08/2021   Weight loss 03/03/2020   Past Surgical History:  Procedure Laterality Date   ABDOMINAL HERNIA REPAIR  07/15/12   Dr Ashley Jacobs   ANTERIOR CERVICAL DECOMP/DISCECTOMY FUSION N/A 07/09/2020   Procedure: ANTERIOR CERVICAL DECOMPRESSION AND FUSION CERVICAL THREE-FOUR.;  Surgeon: Donalee Citrin, MD;  Location: [facility] OR;  Service: Neurosurgery;  Laterality: N/A;  anterior   BROW LIFT  05/07/01   COLONOSCOPY WITH PROPOFOL N/A 04/24/2022   Procedure: COLONOSCOPY WITH PROPOFOL;  Surgeon: Tressia Danas, MD;  Location: [facility] ENDOSCOPY;  Service: Gastroenterology;  Laterality: N/A;   COLONOSCOPY WITH PROPOFOL N/A 04/26/2022   Procedure: COLONOSCOPY WITH PROPOFOL;  Surgeon: Tressia Danas, MD;   Location: [facility] ENDOSCOPY;  Service: Gastroenterology;  Laterality: N/A;   CYSTOSCOPY WITH URETEROSCOPY AND STENT PLACEMENT Right 01/28/2014   Procedure: CYSTOSCOPY WITH URETEROSCOPY, BASKET RETRIVAL AND  STENT PLACEMENT;  Surgeon: Valetta Fuller, MD;  Location: [facility] ORS;  Service: Urology;  Laterality: Right;   epidural injections     multiple procedures   ESOPHAGEAL DILATION  1993 and 1994   multiple times   EYE SURGERY Bilateral 03/25/10, 2012   cataract removal   HEMORRHOID SURGERY  2014   HIATAL HERNIA REPAIR  12/21/93   HOLMIUM LASER APPLICATION Right 01/28/2014   Procedure: HOLMIUM LASER APPLICATION;  Surgeon: Valetta Fuller, MD;  Location: [facility] ORS;  Service: Urology;  Laterality: Right;   INGUINAL HERNIA REPAIR Right 07/15/12   Dr Ashley Jacobs, x2   JOINT REPLACEMENT  07/25/04   right knee   JOINT REPLACEMENT  07/13/10   left knee   KNEE SURGERY  1995  LAPAROSCOPIC CHOLECYSTECTOMY  09/20/06   with intraoperative cholangiogram and right inguinal herniorrhaphy with mesh    LIGAMENT REPAIR Left 07/2013   shoulder   LITHOTRIPSY     x2   LUMBAR LAMINECTOMY/DECOMPRESSION MICRODISCECTOMY Bilateral 12/27/2022   Procedure: Lumbar Two-Three, Lumbar Three-Four Sublaminar Decompression;  Surgeon: Donalee Citrin, MD;  Location: [facility] OR;  Service: Neurosurgery;  Laterality: Bilateral;   NASAL SEPTUM SURGERY  1975   POLYPECTOMY  04/26/2022   Procedure: POLYPECTOMY;  Surgeon: Tressia Danas, MD;  Location: [facility] ENDOSCOPY;  Service: Gastroenterology;;   POSTERIOR CERVICAL FUSION/FORAMINOTOMY N/A 11/24/2020   Procedure: Posterior Cervical Laminectomy Cervical three-four, Cervical four-five with lateral mass fusion;  Surgeon: Donalee Citrin, MD;  Location: [facility] OR;  Service: Neurosurgery;  Laterality: N/A;   REFRACTIVE SURGERY Bilateral    Right total hip replacement  4/14   SKIN CANCER DESTRUCTION     nose, ear   TONSILLECTOMY  as child   TOTAL HIP ARTHROPLASTY Left    URETHRAL DILATION  1991   Dr. Annabell Howells   Patient  Active Problem List   Diagnosis Date Noted   Spinal stenosis of lumbar region 12/27/2022   Drowsiness 12/07/2022   Preop examination 12/06/2022   Weakness of lower extremity 10/10/2022   Skin tear of right elbow without complication 09/18/2022   Skin tear of forearm without complication, left, subsequent encounter 09/18/2022   Fall 09/18/2022   Iron deficiency anemia 09/18/2022   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   Vitamin D deficiency 08/07/2022   Closed fracture of left hip (HCC) 08/07/2022   Hand weakness 05/02/2022   Acute kidney injury (HCC) 05/02/2022   Adenomatous polyp of ascending colon    Adenomatous polyp of colon    Acute metabolic encephalopathy 04/24/2022   GI bleed 04/21/2022   Vasovagal syncope    Hemorrhagic shock (HCC)    Hematochezia    Irregular heart beat 02/03/2022   Constipation 02/03/2022   Carpal tunnel syndrome 01/24/2022   Numbness of hand 01/24/2022   Ulnar neuropathy 01/24/2022   Skin lesion 01/16/2022   History of COVID-19 12/05/2021   Benign paroxysmal positional vertigo 09/14/2021   Palpitations 07/25/2021   Cardiac murmur 07/25/2021   History of chicken pox 07/22/2021   History of hemorrhoids 07/22/2021   COPD (chronic obstructive pulmonary disease) (HCC) 07/22/2021   History of kidney stones 07/22/2021   History of skin cancer 07/22/2021   Complication of anesthesia 07/22/2021   Bilateral sacroiliitis (HCC) 07/22/2021   Other abnormal glucose 07/22/2021   Spondylitis (HCC) 07/22/2021   Greater trochanteric pain syndrome 07/22/2021   Vertigo 07/08/2021   Hypokalemia 07/08/2021   Trochanteric bursitis of left hip 06/30/2021   Cancer (HCC) 04/11/2021   Lumbar radiculopathy 03/02/2021   Gait instability 03/02/2021   Hip pain 03/02/2021   Situational depression 03/02/2021   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   Urinary retention 11/26/2020   Cervical  myelopathy (HCC) 11/24/2020   Body mass index (BMI) 25.0-25.9, adult 11/02/2020   Lumbar spondylosis 11/02/2020   Lumbago of lumbar region with sciatica 10/21/2020   Cystoid macular edema of both eyes 07/28/2020   Status post cervical spinal fusion 07/09/2020   History of total bilateral knee replacement 07/08/2020   Aortic atherosclerosis (HCC) 03/05/2020   Emphysema, unspecified (HCC) 03/05/2020   Arthritis 03/04/2020   Diverticulosis 03/03/2020   Internal hemorrhoids 03/03/2020   Weight loss 03/03/2020   Heme positive stool 02/29/2020   Leg cramps 11/13/2019  Tibialis anterior tendon tear, nontraumatic 11/13/2019   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 07/29/2018   CPAP (continuous positive airway pressure) dependence 03/25/2018   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   Erectile dysfunction 06/20/2017   Stenosis of right carotid 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 03/06/2017   Peripheral neuropathy 06/19/2016   Asymmetric SNHL (sensorineural hearing loss) 02/29/2016   Hearing loss 02/02/2016   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   Allergic rhinitis 02/02/2016   Atypical chest pain 11/24/2015   Preventative health care 11/24/2015   Onychomycosis 06/30/2015   Degenerative disc disease, lumbar 06/30/2015   Other allergic rhinitis 02/18/2015   Deviated nasal septum 02/18/2015   RLS (restless legs syndrome) 02/18/2015   GERD (gastroesophageal reflux disease) 09/18/2014   Hyperglycemia 03/03/2014   Rotator cuff tear 07/27/2013   Sleep apnea with use of continuous positive airway pressure (CPAP) 07/15/2013   Carotid stenosis 02/24/2013   Nonspecific abnormal electrocardiogram (ECG) (EKG) 01/06/2013   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   Lower GI bleed 12/12/2012   Right groin pain 11/29/2012   OSA (obstructive sleep apnea) 11/25/2012   Ventral hernia 07/08/2012   High risk medication use 01/18/2012   Dyspnea 06/12/2011   Hyperlipidemia 12/27/2010   Osteoarthrosis,  unspecified whether generalized or localized, pelvic region and thigh 07/25/2010   Osteoarthrosis, hand 06/13/2010   Anemia 12/17/2009   Hypothyroidism 05/13/2009   GOUT 05/13/2009   Right inguinal hernia 05/13/2009   HIATAL HERNIA 05/13/2009   NEPHROLITHIASIS 05/13/2009   ROSACEA 05/13/2009   Hypertension 05/13/2009    PCP: [name], NP   REFERRING PROVIDER: Susa Raring., MD   REFERRING DIAG:  M25.562, G89.29 (ICD-10-CM) - Chronic pain in left knee  M25.561, G89.29 (ICD-10-CM) - Chronic pain in right knee   THERAPY DIAG:  Chronic pain of both knees  Muscle weakness (generalized)  Unsteadiness on feet  Other abnormalities of gait and mobility  Repeated falls  Other low back pain  RATIONALE FOR EVALUATION AND TREATMENT: Rehabilitation  ONSET DATE: off and on for a couple years  NEXT MD VISIT: none scheduled   SUBJECTIVE:                                                                                                                                                                                                         SUBJECTIVE STATEMENT: Pt reports he is not adjusting well to the time change. Did not sleep well as his CPAP keep slipping. He denies pain today. Adjusted medicine for BP as well.  PAIN: Are you having pain? No -  B knees  Are you having pain? No - low back   PERTINENT HISTORY:  L2-3, L3-4 Sublaminar Decompression on 12/27/22; Revision of femoral component of L THA on 07/02/22 s/p fall with periprosthetic fracture; Aortic atherosclerosis; carotid stenosis; cervical myelopathy s/p ACDF 06/2020 and posterior cervical fusion 11/2020; peripheral neuropathy; lumbar DDD/spondylosis; lumbago with sciatica/lumbar radiculopathy; OA; B TKA; B THA; L greater trochanteric pain syndrome; L RCR 2014; CTR; hearing loss; HTN; BPPV/vertigo; B sacroiliitis; skin cancer; COPD/emphysema; sleep apnea with CPAP; GERD; gout; HLD; LE cramps; RLS   PRECAUTIONS:  Fall  WEIGHT BEARING RESTRICTIONS: No  FALLS:  Has patient fallen in last 6 months? Yes. 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  No fracture or bony lesion or other findings.  Impression: Right knee replacement in good condition.   01/31/23 - L knee x-ray: No fracture or bony lesion or arthritic change or other abnormality.  Left  knee replacement looks well-fixed and in excellent position.  Impression: Knee replacement in good condition.   PATIENT SURVEYS:  LEFS 31 / 80 = 38.8 % 05/10/23: 32 / 80 = 40.0 %  COGNITION: Overall cognitive status: Within functional limits for tasks assessed and History of cognitive impairments - at baseline    SENSATION: Intermittent peripheral neuropathy in B feet  MUSCLE LENGTH: TBA Hamstrings:  ITB:  Piriformis:  Hip flexors:  Quads:  Heelcord:   POSTURE:  rounded shoulders, forward head, and flexed trunk   LOWER EXTREMITY ROM: Limited B hip and ankle ROM Active ROM Right eval Left eval Right 04/03/23 Left 04/03/23 R/L 05/28/23 R/L 06/28/23  Knee flexion 104 96 110 108   108/108  Knee extension -10 -5 -4 -2 4 in LAQ/ 1 in LAQ -4/-2 in LAQ 0/0 supported   Passive ROM Right eval Left eval  Knee flexion    Knee extension 0 0  (Blank rows = not tested)  LOWER EXTREMITY MMT:  MMT Right Eval* Left   Eval* R * 05/10/23 L * 05/10/23 R 06/21/23 L 06/21/23 R 07/16/23 L 07/16/23  Hip flexion 3+ 4 3+ 4- 4- 4 4 4+  Hip extension 3- 3+ 3+ 3+ 4 4+ 4 (tested in S/L) 4 (tested in S/L)  Hip abduction 4- 4 4 4 4 4  4- 4-  Hip adduction 4 4 4 4 4  4+ 4- 4-  Hip internal rotation 3+ 4- 3+ 4-   3+ 4-  Hip external rotation 2+ 3- 2+ 3-   3- 3-  Knee flexion 4 4 4+ 4+   4+ 4+  Knee extension 4 4+ 4 4 4  4+ 4+ 5  Ankle dorsiflexion 4+ 4 4+ 4-   4+ 4  Ankle plantarflexion          Ankle inversion          Ankle eversion           (Blank rows = not tested, *tested in sitting)  FUNCTIONAL TESTS: (Remaining tests to be assessed next visit with PT) 5 times sit to stand: 18.94 sec w/o UE assist (uncontrolled descent on most reps) (03/22/23) Timed up and go (TUG): 21.29 sec with RW (03/22/23) 10 meter walk test: 21.13 sec with RW; Gait speed = 1.55 ft/sec  Berg Balance Scale: 35/56; <36 = High risk for falls (close to 100%) (03/22/23) Dynamic Gait Index: 11/24; Scores of 19 or less are predictive of falls in older community living adults (03/22/23)  04/17/23: TUG: 20 sec  5xSTS: 18 sec  05/10/23: 5xSTS: 13.31 sec w/o UE assist TUG: 21.06 with RW, difficulty with turning : 14.25 sec with RW Gait speed: 2.30 ft/sec with RW DGI: 13/24  05/17/23: Sharlene Motts: 32/56  06/14/23: TUG = 17.21 sec - Goal met  06/26/23:  Sharlene Motts: 38/56  06/28/23: : 12.72 sec with RW Gait speed = 2.58 ft/sec with RW DGI: 14/24   GAIT: Distance walked: 60 ft Assistive device utilized: Environmental consultant - 2 wheeled Level of assistance: SBA Gait pattern: step through pattern, decreased stride length, decreased hip/knee flexion- Right, decreased hip/knee flexion- Left, decreased ankle dorsiflexion- Right,  decreased ankle dorsiflexion- Left, trunk flexed, poor foot clearance- Right, and poor foot clearance- Left Comments: Cues necessary for improved upright posture and rolling walker proximity   TODAY'S TREATMENT:  07/24/23  THERAPEUTIC EXERCISE: to improve flexibility, strength and mobility.  Demonstration, verbal and tactile cues throughout for technique. NuStep - L6 x 6 min Knee flexion 20# 2x12 BLE; 10# x 12 R/L Knee extension 15# 2x12BLE; 5# x 12 R/L Calf raises 2x10 - 3 sec hold Seated row 25# 2x10 Seated bicep curls 5# 2x10   07/16/23  GAIT TRAINING: To normalize gait pattern and improve safety with RW . 270' with collapsible RW - better L foot clearance but continued cues for upright posture and RW proximity  THERAPEUTIC ACTIVITIES: LE MMT - standard testing positions except hip extension tested in S/L due to limited extension ROM  THERAPEUTIC EXERCISE: to improve flexibility, strength and mobility.  Demonstration, verbal and tactile cues throughout for technique. S/L clam 2 x 10 bil S/L reverse clam 2 x 10 bil Standing lunge btw backs of 2 chairs 2 x 10 with each foot fwd   07/09/23  THERAPEUTIC EXERCISE: to improve flexibility, strength and mobility.  Demonstration, verbal and tactile cues throughout for technique. NuStep - L6 x 6 min Standing alt hip ABD 2# x 10, UE support on back of chair Standing alt hip extension 2# x 10, UE support on back of chair Standing alt hip flexion march 2# x 10, UE support on back of chair Seated alt knee extension 2# x 15 Seated alt hip flexion march 2# x 15 Seated GTB clam x 10 (using band with looped "handles" on either wrapped around legs and crossed over on top as pt has difficulty placing looped band around his legs) - replacing hooklying bent-knee fallout in HEP Standing R/L gastroc runner's stretch 2 x 30" Standing heel/toe raises 2 x 10, UE support on back of chair - limited toe clearance/lift on L>R  NEUROMUSCULAR RE-EDUCATION:  To improve posture, balance, proprioception, coordination, and reduce fall risk.  Toe clears to 9" step - attempted with 2# ankles weights but unable to reach step with R foot; weights removed but pt still struggling with R foot clearance Standing on Airex pad: Lateral weight shift x 20 with UE support on back of chair, x 10 w/o UE support B heel/toe weight shift (w/o raise) x 20 with UE support on back of chair, x 10 w/o UE support   07/02/23  THERAPEUTIC EXERCISE: to improve flexibility, strength and mobility.  Demonstration, verbal and tactile cues throughout for technique. NuStep - L6 x 6 min Hip flexion march + ankle DF with looped YTB at midfeet x 20  NEUROMUSCULAR RE-EDUCATION: To improve posture, balance, proprioception, coordination, and reduce fall risk. Standing "penguin" lateral weight shift with looped GTB at ankles x 20 Staggered stance "penguin" ant/post weight shift with looped GTB at ankles x 20 with each foot fwd Staggered stance with fwd foot on 2" block Static stance x 30" OH UE raise with side-to-side reach x 5 Toe clears to 9" step x 10 - increased fatigue on L with difficulty clearing toes on last 2 reps   PATIENT EDUCATION:  Education details: continue with current HEP and gait safety with RW   Person educated: Patient Education method: Explanation, Demonstration, and Verbal cues Education comprehension: verbalized understanding, verbal cues required, and needs further education  HOME EXERCISE PROGRAM: Access Code: [email or web address] URL: [email or web address] Date: 06/28/2023 Prepared by: Glenetta Hew  Exercises - Seated Hamstring Stretch with Strap  - 1 x daily - 7 x weekly - 3 sets - 30 sec hold - Seated Quad Set  - 1 x daily - 7 x weekly - 3 sets - 10 reps - 5 seconds hold - Seated Hip Adduction Squeeze with Ball  - 1 x daily - 7 x weekly - 3 sets - 10 reps - 5 secs hold - Supine Quad Set on Towel Roll  - 1 x daily - 7 x weekly - 2 sets - 10 reps -  3 sec hold - Standing Gastroc Stretch at Counter  - 1 x daily - 7 x weekly - 3 sets - 3 reps - 30 sec hold - Seated Shoulder Horizontal Abduction with Resistance - Palms Down  - 1 x daily - 7 x weekly - 3 sets - 10 reps - Seated Anti-Rotation Press With Anchored Resistance  - 1 x daily - 7 x weekly - 3 sets - 10 reps - Standing Hip Abduction with Counter Support  - 1 x daily - 3 x weekly - 2 sets - 10 reps - Standing Hip Extension with Counter Support  - 1 x daily - 3 x weekly - 2 sets - 10 reps - Standing Knee Flexion with Counter Support  - 1 x daily - 3 x weekly - 2 sets - 10 reps - Seated Long Arc Quad  - 1 x daily - 3 x weekly - 2 sets - 10 reps - Seated March  - 1 x daily - 3 x weekly - 2 sets - 10 reps - Seated Hip Abduction with Resistance  - 1 x daily - 3 x weekly - 2 sets - 10 reps - 3-5 sec hold - Squat with Chair and Counter Support  - 1 x daily - 3 x weekly - 2 sets - 10 reps - 3 sec hold   ASSESSMENT:  CLINICAL IMPRESSION: Pt continues to show postural limitations and dependence on AD for ambulation. Focused on strengthening to improve these deficits. Pt requiring  postural cues and correction of form to target the appropriate muscles. He showed a good tolerance for treatment, continues to benefit from skilled therapy.   OBJECTIVE IMPAIRMENTS: Abnormal gait, decreased activity tolerance, decreased balance, decreased coordination, decreased endurance, decreased knowledge of condition, decreased knowledge of use of DME, decreased mobility, difficulty walking, decreased ROM, decreased strength, decreased safety awareness, increased fascial restrictions, impaired perceived functional ability, increased muscle spasms, impaired flexibility, impaired sensation, improper body mechanics, postural dysfunction, and pain.   ACTIVITY LIMITATIONS: carrying, lifting, bending, sitting, standing, squatting, sleeping, stairs, transfers, bed mobility, bathing, locomotion level, and caring for  others  PARTICIPATION LIMITATIONS: meal prep, cleaning, laundry, driving, shopping, and community activity  PERSONAL FACTORS: Age, Behavior pattern, Fitness, Past/current experiences, Time since onset of injury/illness/exacerbation, and 3+ comorbidities: L2-3, L3-4 Sublaminar Decompression on 12/27/22; Revision of femoral component of L THA on 07/02/22 s/p fall with periprosthetic fracture; Aortic atherosclerosis; carotid stenosis; 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   REHAB POTENTIAL: Good  CLINICAL DECISION MAKING: Evolving/moderate complexity  EVALUATION COMPLEXITY: Moderate   GOALS: Goals reviewed with patient? Yes  SHORT TERM GOALS: Target date: 04/10/2023, extended to 06/07/2023  Patient will be independent with initial HEP. Baseline:  Goal status: MET   2.  Complete balance testing and establish additional STG's/LTG's as appropriate. Baseline:  Goal status: MET  03/22/23  3.  Patient will improve 5x STS time to </= 16 seconds with good eccentric control for improved efficiency and safety with transfers Baseline: 18.94 sec w/o UE assist (uncontrolled descent on most reps) Goal status: MET  05/10/23 - 13.31 sec w/o UE assist with good stability upon standing and improved eccentric control  4.  Patient will demonstrate decreased TUG time to </= 18 sec to decrease risk for falls with transitional mobility Baseline: 21.29 sec with RW; 04/17/23 - 20 sec with RW Goal status: MET 06/14/23 - 17.21 sec  LONG TERM GOALS: Target date: 05/08/2023, extended to 07/05/2023, extended to 08/09/2023  Patient will be  independent with advanced/ongoing HEP to improve outcomes and carryover.  Baseline:  Goal status: IN PROGRESS  06/28/23 - met for current HEP with exceptions of modifications made today  2.  Patient will report at least 50-75% improvement in B knee pain with no further instances of subluxation to improve QOL. Baseline: 5/10 Goal status: MET  05/10/23 - no recent knee pain reported  3.  Patient will demonstrate improved B knee extension AROM to [facility] to allow for improved stability for normal gait and stair mechanics. Baseline: Refer to above LE ROM table Goal status: MET  06/28/23 - knee ROM has plateaued but adequate for normal mobility, gait and stair negotiation  4.  Patient will demonstrate improved B LE strength to >/= 4 to 4+/5 for improved stability and ease of mobility. Baseline: Refer to above LE MMT table Goal status: PARTIALLY MET  07/16/23 - proximal LE strength gradually improving  5.  Patient will be able to ambulate 400' with LRAD and normal gait pattern without increased pain to access community.  Baseline:  Goal status: IN PROGRESS  06/26/23 - 400' with RW and frequent cues for upright posture and improved RW proximity'; increased L hip flexion during  swing phase, halfway through decreased overall gait speed and decreased stride length  6. Patient will be able to ascend/descend stairs with 1 HR and reciprocal step pattern safely to access home and community.  Baseline:  Goal status: IN PROGRESS  06/28/23 - able to complete reciprocal ascent with right HR but fatigues w/o L UE support  (reports he uses cane for other hand at home), step to pattern on descent with continued need for CGA of PT  7.  Patient will report >/= 40/80 on LEFS to demonstrate improved functional ability. Baseline: 31 / 80 = 38.8 %; 05/10/23 - 32 / 80 = 40.0 % Goal status: MET  06/28/23 -  40 / 80 = 50.0 %  8.  Patient will demonstrate at least 19/24 on DGI to decrease risk of falls. Baseline: 11/24  (03/22/23);  13/24 (05/10/23)  Goal status: IN PROGRESS  06/28/23 - 14/24  9.  Patient will improve Berg score by at least 8 points to improve safety and stability with ADLs in standing and reduce risk for falls. Baseline: 35/56 (03/22/23);  32/56 (05/17/23) Goal status: IN PROGRESS  06/26/23 - 38/56   PLAN:  PT FREQUENCY: 2x/week  PT DURATION: 4-6 weeks  PLANNED INTERVENTIONS: 69629- PT Re-evaluation, 97110-Therapeutic exercises, 97530- Therapeutic activity, 97112- Neuromuscular re-education, 97535- Self Care, [id number]- Manual therapy, [phone]- Gait training, [phone]- Aquatic Therapy, [id number]- Electrical stimulation (unattended), 97035- Ultrasound, Patient/Family education, Balance training, Stair training, Taping, Dry Needling, Joint mobilization, Spinal mobilization, DME instructions, Cryotherapy, Moist heat, Therapeutic exercises, Therapeutic activity, Neuromuscular re-education, Gait training, and Self Care  PLAN FOR NEXT SESSION: core/lumbopelvic/B LE flexibility and strengthening; balance training; review and update/progress HEP as indicated; if forward flexed posture with gait persists, consider transition to standard RW   Darleene Cleaver, PTA 07/24/2023, 2:53 PM

## 2023-07-26 ENCOUNTER — Ambulatory Visit: Payer: Medicare HMO

## 2023-07-26 DIAGNOSIS — M6281 Muscle weakness (generalized): Secondary | ICD-10-CM | POA: Diagnosis not present

## 2023-07-26 DIAGNOSIS — M25562 Pain in left knee: Secondary | ICD-10-CM | POA: Diagnosis not present

## 2023-07-26 DIAGNOSIS — M25561 Pain in right knee: Secondary | ICD-10-CM | POA: Diagnosis not present

## 2023-07-26 DIAGNOSIS — R296 Repeated falls: Secondary | ICD-10-CM

## 2023-07-26 DIAGNOSIS — R2681 Unsteadiness on feet: Secondary | ICD-10-CM

## 2023-07-26 DIAGNOSIS — M5459 Other low back pain: Secondary | ICD-10-CM | POA: Diagnosis not present

## 2023-07-26 DIAGNOSIS — G8929 Other chronic pain: Secondary | ICD-10-CM | POA: Diagnosis not present

## 2023-07-26 DIAGNOSIS — R2689 Other abnormalities of gait and mobility: Secondary | ICD-10-CM | POA: Diagnosis not present

## 2023-07-26 NOTE — Therapy (Addendum)
OUTPATIENT PHYSICAL THERAPY TREATMENT     Patient Name: [name] Pratt MRN: [id number] DOB:10/25/1940, 82 y.o., male Today's Date: 07/26/2023   END OF SESSION:  PT End of Session - 07/26/23 1359     Visit Number 26    Date for PT Re-Evaluation 08/09/23    Authorization Type [name]    Authorization Time Period 07/02/23 - 08/09/23    Authorization - Visit Number 5    Authorization - Number of Visits 10    Progress Note Due on Visit 31   Recert on visit #21 - 06/28/23   PT Start Time 1315    PT Stop Time 1400    PT Time Calculation (min) 45 min    Activity Tolerance Patient tolerated treatment well    Behavior During Therapy [facility] for tasks assessed/performed                  Past Medical History:  Diagnosis Date   Allergic rhinitis 02/02/2016   Anemia 12/17/2009   Formatting of this note might be different from the original. 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 Hypertension   Benign paroxysmal positional vertigo 09/14/2021   Bilateral sacroiliitis (HCC) 07/22/2021   Body mass index (BMI) 25.0-25.9, adult 11/02/2020   Cancer (HCC)    skin cancer   Cardiac murmur 07/25/2021   Carotid stenosis    a. Carotid U/S 5/13: LICA < 50%, RICA 50-69%;  b.  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   Cystoid macular edema of both eyes  07/28/2020   Degenerative disc disease, lumbar 06/30/2015   Deviated nasal septum 02/18/2015   Diverticulosis 03/03/2020   DJD (degenerative joint disease) of hip 12/24/2012   Dyspnea 06/12/2011   Emphysema, unspecified (HCC) 03/05/2020   Erectile dysfunction 06/20/2017   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   Gait disorder 03/02/2021   GERD (gastroesophageal reflux disease)    GOUT 05/13/2009   Qualifier: Diagnosis of  By: Kem Parkinson     Greater trochanteric pain syndrome 07/22/2021   Hand pain, left 01/16/2022   Hearing loss 02/02/2016   Heme positive stool 02/29/2020   HIATAL HERNIA 05/13/2009   Qualifier: Diagnosis of  By: Kem Parkinson     High risk medication use 01/18/2012   Hip pain 03/02/2021   History of chicken pox    History of COVID-19 12/05/2021   History of hemorrhoids    History of kidney stones    History of skin cancer 07/22/2021   skin cancer   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 Cardiology - Dr Estrella Myrtle at [facility] cardiology  ICD-10 cut over   Hypertension  under control   Hypokalemia 07/08/2021   Hypothyroidism    Internal hemorrhoids 03/03/2020   Leg cramps 11/13/2019   Lower GI bleed 12/12/2012   Lumbago of lumbar region with sciatica 10/21/2020   Lumbar radiculopathy 03/02/2021   Lumbar spondylosis 11/02/2020   NEPHROLITHIASIS 05/13/2009   Qualifier: Diagnosis of  By: Kem Parkinson     Nonspecific abnormal electrocardiogram (ECG) (EKG) 01/06/2013   Numbness of hand 01/24/2022   Onychomycosis 06/30/2015   Osteoarthrosis, 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 Osteoarthritis Of The Hand  10/1 IMO update   Osteoarthrosis, unspecified whether generalized  or localized, pelvic region and thigh 07/25/2010   Formatting of this note might be different from the original. Osteoarthritis Of The Hip   Other abnormal glucose 07/22/2021   Other allergic rhinitis 02/18/2015   Palpitations 07/25/2021   Peripheral neuropathy 06/19/2016   Preventative health care 11/24/2015   Right groin pain 11/29/2012   Right inguinal hernia 05/13/2009   Qualifier: Diagnosis of  By: Kem Parkinson     RLS (restless legs syndrome) 02/18/2015   Rosacea    Rotator cuff tear 07/27/2013   Situational depression 03/02/2021   Skin lesion 01/16/2022   Sleep apnea with use of continuous positive airway pressure (CPAP) 07/15/2013   CPAP set to  7 cm water,  Residual AHi was 7.8  With no leak.  User time 6 hours.  479 days , not used only 12 days - highly compliant 06-23-13  .    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 Fawn Kirk   Ulnar neuropathy 01/24/2022   Urinary retention 11/26/2020   Ventral hernia 07/08/2012   Vertigo 07/08/2021   Weight loss 03/03/2020   Past Surgical History:  Procedure Laterality Date   ABDOMINAL HERNIA REPAIR  07/15/12   Dr Ashley Jacobs   ANTERIOR CERVICAL DECOMP/DISCECTOMY FUSION N/A 07/09/2020   Procedure: ANTERIOR CERVICAL DECOMPRESSION AND FUSION CERVICAL THREE-FOUR.;  Surgeon: Donalee Citrin, MD;  Location: [facility] OR;  Service: Neurosurgery;  Laterality: N/A;  anterior   BROW LIFT  05/07/01   COLONOSCOPY WITH PROPOFOL N/A 04/24/2022   Procedure: COLONOSCOPY WITH PROPOFOL;  Surgeon: Tressia Danas, MD;  Location: [facility] ENDOSCOPY;  Service: Gastroenterology;  Laterality: N/A;   COLONOSCOPY WITH PROPOFOL N/A 04/26/2022   Procedure: COLONOSCOPY WITH PROPOFOL;   Surgeon: Tressia Danas, MD;  Location: [facility] ENDOSCOPY;  Service: Gastroenterology;  Laterality: N/A;   CYSTOSCOPY WITH URETEROSCOPY AND STENT PLACEMENT Right 01/28/2014   Procedure: CYSTOSCOPY WITH URETEROSCOPY, BASKET RETRIVAL AND  STENT PLACEMENT;  Surgeon: Valetta Fuller, MD;  Location: [facility] ORS;  Service: Urology;  Laterality: Right;   epidural injections     multiple procedures   ESOPHAGEAL DILATION  1993 and 1994   multiple times   EYE SURGERY Bilateral 03/25/10, 2012   cataract removal   HEMORRHOID SURGERY  2014   HIATAL HERNIA REPAIR  12/21/93   HOLMIUM LASER APPLICATION Right 01/28/2014   Procedure: HOLMIUM LASER APPLICATION;  Surgeon: Valetta Fuller, MD;  Location: [facility] ORS;  Service: Urology;  Laterality: Right;   INGUINAL HERNIA REPAIR Right 07/15/12   Dr Ashley Jacobs, x2   JOINT REPLACEMENT  07/25/04   right knee   JOINT REPLACEMENT  07/13/10   left knee   KNEE SURGERY  1995   LAPAROSCOPIC CHOLECYSTECTOMY  09/20/06   with intraoperative cholangiogram and right inguinal herniorrhaphy with mesh    LIGAMENT REPAIR Left 07/2013   shoulder   LITHOTRIPSY     x2   LUMBAR LAMINECTOMY/DECOMPRESSION MICRODISCECTOMY Bilateral 12/27/2022   Procedure: Lumbar Two-Three, Lumbar Three-Four Sublaminar Decompression;  Surgeon: Donalee Citrin, MD;  Location: [facility] OR;  Service: Neurosurgery;  Laterality: Bilateral;   NASAL SEPTUM SURGERY  1975   POLYPECTOMY  04/26/2022   Procedure: POLYPECTOMY;  Surgeon: Tressia Danas, MD;  Location: [facility] ENDOSCOPY;  Service: Gastroenterology;;   POSTERIOR CERVICAL FUSION/FORAMINOTOMY N/A 11/24/2020   Procedure: Posterior Cervical Laminectomy Cervical three-four, Cervical four-five with lateral mass fusion;  Surgeon: Donalee Citrin, MD;  Location: [facility] OR;  Service: Neurosurgery;  Laterality: N/A;   REFRACTIVE SURGERY Bilateral    Right total hip replacement  4/14   SKIN CANCER DESTRUCTION     nose, ear   TONSILLECTOMY  as child   TOTAL HIP ARTHROPLASTY Left    URETHRAL DILATION   1991   Dr. Annabell Howells   Patient Active Problem List   Diagnosis Date Noted   Spinal stenosis of lumbar region 12/27/2022   Drowsiness 12/07/2022   Preop examination 12/06/2022   Weakness of lower extremity 10/10/2022   Skin tear of right elbow without complication 09/18/2022   Skin tear of forearm without complication, left, subsequent encounter 09/18/2022   Fall 09/18/2022   Iron deficiency anemia 09/18/2022   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   Vitamin D deficiency 08/07/2022   Closed fracture of left hip (HCC) 08/07/2022   Hand weakness 05/02/2022   Acute kidney injury (HCC) 05/02/2022   Adenomatous polyp of ascending colon    Adenomatous polyp of colon    Acute metabolic encephalopathy 04/24/2022   GI bleed 04/21/2022   Vasovagal syncope    Hemorrhagic shock (HCC)    Hematochezia    Irregular heart beat 02/03/2022   Constipation 02/03/2022   Carpal tunnel syndrome 01/24/2022   Numbness of hand 01/24/2022   Ulnar neuropathy 01/24/2022   Skin lesion 01/16/2022   History of COVID-19 12/05/2021   Benign paroxysmal positional vertigo 09/14/2021   Palpitations 07/25/2021   Cardiac murmur 07/25/2021   History of chicken pox 07/22/2021   History of hemorrhoids 07/22/2021   COPD (chronic obstructive pulmonary disease) (HCC) 07/22/2021   History of kidney stones 07/22/2021   History of skin cancer 07/22/2021   Complication of anesthesia 07/22/2021   Bilateral sacroiliitis (HCC) 07/22/2021   Other abnormal glucose 07/22/2021   Spondylitis (HCC) 07/22/2021   Greater trochanteric pain syndrome 07/22/2021   Vertigo 07/08/2021   Hypokalemia 07/08/2021   Trochanteric bursitis of left hip 06/30/2021   Cancer (HCC) 04/11/2021   Lumbar radiculopathy 03/02/2021   Gait instability 03/02/2021   Hip pain 03/02/2021   Situational depression 03/02/2021   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   Urinary  retention 11/26/2020   Cervical myelopathy (HCC) 11/24/2020   Body mass index (BMI) 25.0-25.9, adult 11/02/2020   Lumbar spondylosis 11/02/2020   Lumbago of lumbar region with sciatica 10/21/2020   Cystoid macular edema of both eyes 07/28/2020   Status post cervical spinal fusion 07/09/2020   History of total bilateral knee replacement 07/08/2020   Aortic atherosclerosis (HCC) 03/05/2020   Emphysema, unspecified (HCC) 03/05/2020   Arthritis 03/04/2020   Diverticulosis 03/03/2020   Internal hemorrhoids 03/03/2020   Weight loss 03/03/2020  Heme positive stool 02/29/2020   Leg cramps 11/13/2019   Tibialis anterior tendon tear, nontraumatic 11/13/2019   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 07/29/2018   CPAP (continuous positive airway pressure) dependence 03/25/2018   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   Erectile dysfunction 06/20/2017   Stenosis of right carotid artery without cerebral infarction 03/06/2017   Peripheral neuropathy 06/19/2016   Asymmetric SNHL (sensorineural hearing loss) 02/29/2016   Hearing loss 02/02/2016   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   Allergic rhinitis 02/02/2016   Atypical chest pain 11/24/2015   Preventative health care 11/24/2015   Onychomycosis 06/30/2015   Degenerative disc disease, lumbar 06/30/2015   Other allergic rhinitis 02/18/2015   Deviated nasal septum 02/18/2015   RLS (restless legs syndrome) 02/18/2015   GERD (gastroesophageal reflux disease) 09/18/2014   Hyperglycemia 03/03/2014   Rotator cuff tear 07/27/2013   Sleep apnea with use of continuous positive airway pressure (CPAP) 07/15/2013   Carotid stenosis 02/24/2013   Nonspecific abnormal electrocardiogram (ECG) (EKG) 01/06/2013   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   Lower GI bleed 12/12/2012   Right groin pain 11/29/2012   OSA (obstructive sleep apnea) 11/25/2012   Ventral hernia 07/08/2012   High risk medication use 01/18/2012   Dyspnea 06/12/2011   Hyperlipidemia  12/27/2010   Osteoarthrosis, unspecified whether generalized or localized, pelvic region and thigh 07/25/2010   Osteoarthrosis, hand 06/13/2010   Anemia 12/17/2009   Hypothyroidism 05/13/2009   GOUT 05/13/2009   Right inguinal hernia 05/13/2009   HIATAL HERNIA 05/13/2009   NEPHROLITHIASIS 05/13/2009   ROSACEA 05/13/2009   Hypertension 05/13/2009    PCP: [name], NP   REFERRING PROVIDER: Susa Raring., MD   REFERRING DIAG:  M25.562, G89.29 (ICD-10-CM) - Chronic pain in left knee  M25.561, G89.29 (ICD-10-CM) - Chronic pain in right knee   THERAPY DIAG:  Chronic pain of both knees  Muscle weakness (generalized)  Unsteadiness on feet  Other abnormalities of gait and mobility  Repeated falls  Other low back pain  RATIONALE FOR EVALUATION AND TREATMENT: Rehabilitation  ONSET DATE: off and on for a couple years  NEXT MD VISIT: none scheduled   SUBJECTIVE:                                                                                                                                                                                                         SUBJECTIVE STATEMENT: Pt reports that he was sore after last visit. He checked BP this morning (117/70).  PAIN: Are you having pain? No - B knees  Are  you having pain? No - low back   PERTINENT HISTORY:  L2-3, L3-4 Sublaminar Decompression on 12/27/22; Revision of femoral component of L THA on 07/02/22 s/p fall with periprosthetic fracture; Aortic atherosclerosis; carotid stenosis; cervical myelopathy s/p ACDF 06/2020 and posterior cervical fusion 11/2020; peripheral neuropathy; lumbar DDD/spondylosis; lumbago with sciatica/lumbar radiculopathy; OA; B TKA; B THA; L greater trochanteric pain syndrome; L RCR 2014; CTR; hearing loss; HTN; BPPV/vertigo; B sacroiliitis; skin cancer; COPD/emphysema; sleep apnea with CPAP; GERD; gout; HLD; LE cramps; RLS   PRECAUTIONS: Fall  WEIGHT BEARING RESTRICTIONS:  No  FALLS:  Has patient fallen in last 6 months? Yes. 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  No fracture or bony lesion or other findings.  Impression: Right knee replacement in good condition.   01/31/23 - L knee x-ray: No fracture or bony lesion or arthritic change or other abnormality.  Left  knee replacement looks well-fixed and in excellent position.  Impression: Knee replacement in good condition.   PATIENT SURVEYS:  LEFS 31 / 80 = 38.8 % 05/10/23: 32 / 80 = 40.0 %  COGNITION: Overall cognitive status: Within functional limits for tasks assessed and History of cognitive impairments - at baseline    SENSATION: Intermittent peripheral neuropathy in B feet  MUSCLE LENGTH: TBA Hamstrings:  ITB:  Piriformis:  Hip flexors:  Quads:  Heelcord:   POSTURE:  rounded shoulders, forward head, and flexed trunk   LOWER EXTREMITY ROM: Limited B hip and ankle ROM Active ROM Right eval Left eval Right 04/03/23 Left 04/03/23 R/L 05/28/23 R/L 06/28/23  Knee flexion 104 96 110 108  108/108  Knee extension -10 -5 -4 -2 4  in LAQ/ 1 in LAQ -4/-2 in LAQ 0/0 supported   Passive ROM Right eval Left eval  Knee flexion    Knee extension 0 0  (Blank rows = not tested)  LOWER EXTREMITY MMT:  MMT Right Eval* Left   Eval* R * 05/10/23 L * 05/10/23 R 06/21/23 L 06/21/23 R 07/16/23 L 07/16/23  Hip flexion 3+ 4 3+ 4- 4- 4 4 4+  Hip extension 3- 3+ 3+ 3+ 4 4+ 4 (tested in S/L) 4 (tested in S/L)  Hip abduction 4- 4 4 4 4 4  4- 4-  Hip adduction 4 4 4 4 4  4+ 4- 4-  Hip internal rotation 3+ 4- 3+ 4-   3+ 4-  Hip external rotation 2+ 3- 2+ 3-   3- 3-  Knee flexion 4 4 4+ 4+   4+ 4+  Knee extension 4 4+ 4 4 4  4+ 4+ 5  Ankle dorsiflexion 4+ 4 4+ 4-   4+ 4  Ankle plantarflexion          Ankle inversion          Ankle eversion           (Blank rows = not tested, *tested in sitting)  FUNCTIONAL TESTS: (Remaining tests to be assessed next visit with PT) 5 times sit to stand: 18.94 sec w/o UE assist (uncontrolled descent on most reps) (03/22/23) Timed up and go (TUG): 21.29 sec with RW (03/22/23) 10 meter walk test: 21.13 sec with RW; Gait speed = 1.55 ft/sec  Berg Balance Scale: 35/56; <36 = High risk for falls (close to 100%) (03/22/23) Dynamic Gait Index: 11/24; Scores of 19 or less are predictive of falls in older community living adults (03/22/23)  04/17/23: TUG: 20 sec  5xSTS: 18 sec  05/10/23: 5xSTS: 13.31 sec w/o UE assist TUG: 21.06 with RW, difficulty with turning : 14.25 sec with RW Gait speed: 2.30 ft/sec with RW DGI: 13/24  05/17/23: Sharlene Motts: 32/56  06/14/23: TUG = 17.21 sec - Goal met  06/26/23:  Sharlene Motts: 38/56  06/28/23: : 12.72 sec with RW Gait speed = 2.58 ft/sec with RW DGI: 14/24   GAIT: Distance walked: 60 ft Assistive device utilized: Environmental consultant - 2 wheeled Level of assistance: SBA Gait pattern: step through pattern, decreased stride length, decreased hip/knee flexion- Right, decreased hip/knee flexion- Left, decreased ankle dorsiflexion- Right, decreased ankle dorsiflexion- Left, trunk  flexed, poor foot clearance- Right, and poor foot clearance- Left Comments: Cues necessary for improved upright posture and rolling walker proximity   TODAY'S TREATMENT:  07/26/23  THERAPEUTIC EXERCISE: to improve flexibility, strength and mobility.  Demonstration, verbal and tactile cues throughout for technique. NuStep - L6 x 6 min Seated row 25# 2x10 low grips Seated bicep curls 5lb 2 x 10 BUE Standing calf raises 2x10 - 3 sec hold at top Knee extension 20# x 20 reps BLE  GAIT TRAINING: To normalize gait pattern and improve safety with RW. 400' with collapsible RW - better L foot clearance but continued cues for upright posture and RW proximity  07/24/23  THERAPEUTIC EXERCISE: to improve flexibility, strength and mobility.  Demonstration, verbal and tactile cues throughout for technique. NuStep - L6 x 6 min Knee flexion 20# 2x12 BLE; 10# x 12 R/L Knee extension 15# 2x12BLE; 5# x 12 R/L Calf raises 2x10 - 3 sec hold Seated row 25# 2x10 Seated bicep curls 5# 2x10   07/16/23  GAIT TRAINING: To normalize gait pattern and improve safety with RW . 270' with collapsible RW - better L foot clearance but continued cues for upright posture and RW proximity  THERAPEUTIC ACTIVITIES: LE MMT - standard testing positions except hip extension tested in S/L due to limited extension ROM  THERAPEUTIC EXERCISE: to improve flexibility, strength and mobility.  Demonstration, verbal and tactile cues throughout for technique. S/L clam 2 x 10 bil S/L reverse clam 2 x 10 bil Standing lunge btw backs of 2 chairs 2 x 10 with each foot fwd   07/09/23  THERAPEUTIC EXERCISE: to improve flexibility, strength and mobility.  Demonstration, verbal and tactile cues throughout for technique. NuStep - L6 x 6 min Standing alt hip ABD 2# x 10, UE support on back of chair Standing alt hip extension 2# x 10, UE support on back of chair Standing alt hip flexion march 2# x 10, UE support on back of chair Seated  alt knee extension 2# x 15 Seated alt  hip flexion march 2# x 15 Seated GTB clam x 10 (using band with looped "handles" on either wrapped around legs and crossed over on top as pt has difficulty placing looped band around his legs) - replacing hooklying bent-knee fallout in HEP Standing R/L gastroc runner's stretch 2 x 30" Standing heel/toe raises 2 x 10, UE support on back of chair - limited toe clearance/lift on L>R  NEUROMUSCULAR RE-EDUCATION: To improve posture, balance, proprioception, coordination, and reduce fall risk.  Toe clears to 9" step - attempted with 2# ankles weights but unable to reach step with R foot; weights removed but pt still struggling with R foot clearance Standing on Airex pad: Lateral weight shift x 20 with UE support on back of chair, x 10 w/o UE support B heel/toe weight shift (w/o raise) x 20 with UE support on back of chair, x 10 w/o UE support   07/02/23  THERAPEUTIC EXERCISE: to improve flexibility, strength and mobility.  Demonstration, verbal and tactile cues throughout for technique. NuStep - L6 x 6 min Hip flexion march + ankle DF with looped YTB at midfeet x 20  NEUROMUSCULAR RE-EDUCATION: To improve posture, balance, proprioception, coordination, and reduce fall risk. Standing "penguin" lateral weight shift with looped GTB at ankles x 20 Staggered stance "penguin" ant/post weight shift with looped GTB at ankles x 20 with each foot fwd Staggered stance with fwd foot on 2" block Static stance x 30" OH UE raise with side-to-side reach x 5 Toe clears to 9" step x 10 - increased fatigue on L with difficulty clearing toes on last 2 reps   PATIENT EDUCATION:  Education details: continue with current HEP and gait safety with RW   Person educated: Patient Education method: Explanation, Demonstration, and Verbal cues Education comprehension: verbalized understanding, verbal cues required, and needs further education  HOME EXERCISE PROGRAM: Access Code:  [email or web address] URL: [email or web address] Date: 06/28/2023 Prepared by: Glenetta Hew  Exercises - Seated Hamstring Stretch with Strap  - 1 x daily - 7 x weekly - 3 sets - 30 sec hold - Seated Quad Set  - 1 x daily - 7 x weekly - 3 sets - 10 reps - 5 seconds hold - Seated Hip Adduction Squeeze with Ball  - 1 x daily - 7 x weekly - 3 sets - 10 reps - 5 secs hold - Supine Quad Set on Towel Roll  - 1 x daily - 7 x weekly - 2 sets - 10 reps - 3 sec hold - Standing Gastroc Stretch at Counter  - 1 x daily - 7 x weekly - 3 sets - 3 reps - 30 sec hold - Seated Shoulder Horizontal Abduction with Resistance - Palms Down  - 1 x daily - 7 x weekly - 3 sets - 10 reps - Seated Anti-Rotation Press With Anchored Resistance  - 1 x daily - 7 x weekly - 3 sets - 10 reps - Standing Hip Abduction with Counter Support  - 1 x daily - 3 x weekly - 2 sets - 10 reps - Standing Hip Extension with Counter Support  - 1 x daily - 3 x weekly - 2 sets - 10 reps - Standing Knee Flexion with Counter Support  - 1 x daily - 3 x weekly - 2 sets - 10 reps - Seated Long Arc Quad  - 1 x daily - 3 x weekly - 2 sets - 10 reps - Seated March  - 1 x daily - 3 x weekly -  2 sets - 10 reps - Seated Hip Abduction with Resistance  - 1 x daily - 3 x weekly - 2 sets - 10 reps - 3-5 sec hold - Squat with Chair and Counter Support  - 1 x daily - 3 x weekly - 2 sets - 10 reps - 3 sec hold   ASSESSMENT:  CLINICAL IMPRESSION: Pt continues to show postural limitations and dependence on AD for ambulation. Pt continues to show postural limitations and dependence on AD for ambulation. Continued focus on strengthening to improve these deficits. He still catches L foot occasionally when walking and needs cues for upright posture when walking. He was able to walk for at least 400 feet. He showed a good tolerance for treatment, continues to benefit from skilled therapy.   OBJECTIVE IMPAIRMENTS: Abnormal gait, decreased activity tolerance,  decreased balance, decreased coordination, decreased endurance, decreased knowledge of condition, decreased knowledge of use of DME, decreased mobility, difficulty walking, decreased ROM, decreased strength, decreased safety awareness, increased fascial restrictions, impaired perceived functional ability, increased muscle spasms, impaired flexibility, impaired sensation, improper body mechanics, postural dysfunction, and pain.   ACTIVITY LIMITATIONS: carrying, lifting, bending, sitting, standing, squatting, sleeping, stairs, transfers, bed mobility, bathing, locomotion level, and caring for others  PARTICIPATION LIMITATIONS: meal prep, cleaning, laundry, driving, shopping, and community activity  PERSONAL FACTORS: Age, Behavior pattern, Fitness, Past/current experiences, Time since onset of injury/illness/exacerbation, and 3+ comorbidities: L2-3, L3-4 Sublaminar Decompression on 12/27/22; Revision of femoral component of L THA on 07/02/22 s/p fall with periprosthetic fracture; Aortic atherosclerosis; carotid stenosis; 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   REHAB POTENTIAL: Good  CLINICAL DECISION MAKING: Evolving/moderate complexity  EVALUATION COMPLEXITY: Moderate   GOALS: Goals reviewed with patient? Yes  SHORT TERM GOALS: Target date: 04/10/2023, extended to 06/07/2023  Patient will be independent with initial HEP. Baseline:  Goal status: MET   2.  Complete balance testing and establish additional STG's/LTG's as appropriate. Baseline:  Goal status: MET  03/22/23  3.  Patient will improve 5x STS time to </= 16  seconds with good eccentric control for improved efficiency and safety with transfers Baseline: 18.94 sec w/o UE assist (uncontrolled descent on most reps) Goal status: MET  05/10/23 - 13.31 sec w/o UE assist with good stability upon standing and improved eccentric control  4.  Patient will demonstrate decreased TUG time to </= 18 sec to decrease risk for falls with transitional mobility Baseline: 21.29 sec with RW; 04/17/23 - 20 sec with RW Goal status: MET 06/14/23 - 17.21 sec  LONG TERM GOALS: Target date: 05/08/2023, extended to 07/05/2023, extended to 08/09/2023  Patient will be independent with advanced/ongoing HEP to improve outcomes and carryover.  Baseline:  Goal status: IN PROGRESS  06/28/23 - met for current HEP with exceptions of modifications made today  2.  Patient will report at least 50-75% improvement in B knee pain with no further instances of subluxation to improve QOL. Baseline: 5/10 Goal status: MET  05/10/23 - no recent knee pain reported  3.  Patient will demonstrate improved B knee extension AROM to [facility] to allow for improved stability for normal gait and stair mechanics. Baseline: Refer to above LE ROM table Goal  status: MET  06/28/23 - knee ROM has plateaued but adequate for normal mobility, gait and stair negotiation  4.  Patient will demonstrate improved B LE strength to >/= 4 to 4+/5 for improved stability and ease of mobility. Baseline: Refer to above LE MMT table Goal status: PARTIALLY MET  07/16/23 - proximal LE strength gradually improving  5.  Patient will be able to ambulate 400' with LRAD and normal gait pattern without increased pain to access community.  Baseline:  Goal status: IN PROGRESS  07/26/23 - 400' with RW and frequent cues for upright posture and improved RW proximity'; increased L hip flexion during swing phase, halfway through decreased overall gait speed and decreased stride length  6. Patient will be able to ascend/descend stairs with 1 HR and  reciprocal step pattern safely to access home and community.  Baseline:  Goal status: IN PROGRESS  06/28/23 - able to complete reciprocal ascent with right HR but fatigues w/o L UE support  (reports he uses cane for other hand at home), step to pattern on descent with continued need for CGA of PT  7.  Patient will report >/= 40/80 on LEFS to demonstrate improved functional ability. Baseline: 31 / 80 = 38.8 %; 05/10/23 - 32 / 80 = 40.0 % Goal status: MET  06/28/23 -  40 / 80 = 50.0 %  8.  Patient will demonstrate at least 19/24 on DGI to decrease risk of falls. Baseline: 11/24 (03/22/23);  13/24 (05/10/23)  Goal status: IN PROGRESS  06/28/23 - 14/24  9.  Patient will improve Berg score by at least 8 points to improve safety and stability with ADLs in standing and reduce risk for falls. Baseline: 35/56 (03/22/23);  32/56 (05/17/23) Goal status: IN PROGRESS  06/26/23 - 38/56   PLAN:  PT FREQUENCY: 2x/week  PT DURATION: 4-6 weeks  PLANNED INTERVENTIONS: 16109- PT Re-evaluation, 97110-Therapeutic exercises, 97530- Therapeutic activity, 97112- Neuromuscular re-education, 97535- Self Care, 60454- Manual therapy, [phone]- Gait training, [phone]- Aquatic Therapy, 97014- Electrical stimulation (unattended), 97035- Ultrasound, Patient/Family education, Balance training, Stair training, Taping, Dry Needling, Joint mobilization, Spinal mobilization, DME instructions, Cryotherapy, Moist heat, Therapeutic exercises, Therapeutic activity, Neuromuscular re-education, Gait training, and Self Care  PLAN FOR NEXT SESSION: core/lumbopelvic/B LE flexibility and strengthening; balance training; review and update/progress HEP as indicated; if forward flexed posture with gait persists, consider transition to standard RW   Darleene Cleaver, PTA 07/26/2023, 2:03 PM

## 2023-07-30 ENCOUNTER — Other Ambulatory Visit: Payer: Self-pay | Admitting: Family

## 2023-07-30 ENCOUNTER — Ambulatory Visit: Payer: Medicare HMO | Admitting: Physical Therapy

## 2023-07-30 ENCOUNTER — Encounter: Payer: Self-pay | Admitting: Physical Therapy

## 2023-07-30 DIAGNOSIS — G8929 Other chronic pain: Secondary | ICD-10-CM

## 2023-07-30 DIAGNOSIS — R2681 Unsteadiness on feet: Secondary | ICD-10-CM

## 2023-07-30 DIAGNOSIS — R2689 Other abnormalities of gait and mobility: Secondary | ICD-10-CM

## 2023-07-30 DIAGNOSIS — M25561 Pain in right knee: Secondary | ICD-10-CM | POA: Diagnosis not present

## 2023-07-30 DIAGNOSIS — M5459 Other low back pain: Secondary | ICD-10-CM | POA: Diagnosis not present

## 2023-07-30 DIAGNOSIS — R296 Repeated falls: Secondary | ICD-10-CM | POA: Diagnosis not present

## 2023-07-30 DIAGNOSIS — M6281 Muscle weakness (generalized): Secondary | ICD-10-CM | POA: Diagnosis not present

## 2023-07-30 DIAGNOSIS — M25562 Pain in left knee: Secondary | ICD-10-CM | POA: Diagnosis not present

## 2023-07-30 NOTE — Therapy (Signed)
OUTPATIENT PHYSICAL THERAPY TREATMENT     Patient Name: [name] Pratt MRN: [id number] DOB:10/27/1940, 82 y.o., male Today's Date: 07/30/2023   END OF SESSION:  PT End of Session - 07/30/23 1354     Visit Number 27    Date for PT Re-Evaluation 08/09/23    Authorization Type [name]    Authorization Time Period 07/02/23 - 08/09/23    Authorization - Visit Number 6    Authorization - Number of Visits 10    Progress Note Due on Visit 31   Recert on visit #21 - 06/28/23   PT Start Time 1358    PT Stop Time 1445    PT Time Calculation (min) 47 min    Activity Tolerance Patient tolerated treatment well    Behavior During Therapy [facility] for tasks assessed/performed                   Past Medical History:  Diagnosis Date   Allergic rhinitis 02/02/2016   Anemia 12/17/2009   Formatting of this note might be different from the original. 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 Hypertension   Benign paroxysmal positional vertigo 09/14/2021   Bilateral sacroiliitis (HCC) 07/22/2021   Body mass index (BMI) 25.0-25.9, adult 11/02/2020   Cancer (HCC)    skin cancer   Cardiac murmur 07/25/2021   Carotid stenosis    a. Carotid U/S 5/13: LICA < 50%, RICA 50-69%;  b.  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   Cystoid macular edema of both  eyes 07/28/2020   Degenerative disc disease, lumbar 06/30/2015   Deviated nasal septum 02/18/2015   Diverticulosis 03/03/2020   DJD (degenerative joint disease) of hip 12/24/2012   Dyspnea 06/12/2011   Emphysema, unspecified (HCC) 03/05/2020   Erectile dysfunction 06/20/2017   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   Gait disorder 03/02/2021   GERD (gastroesophageal reflux disease)    GOUT 05/13/2009   Qualifier: Diagnosis of  By: Kem Parkinson     Greater trochanteric pain syndrome 07/22/2021   Hand pain, left 01/16/2022   Hearing loss 02/02/2016   Heme positive stool 02/29/2020   HIATAL HERNIA 05/13/2009   Qualifier: Diagnosis of  By: Kem Parkinson     High risk medication use 01/18/2012   Hip pain 03/02/2021   History of chicken pox    History of COVID-19 12/05/2021   History of hemorrhoids    History of kidney stones    History of skin cancer 07/22/2021   skin cancer   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 Cardiology - Dr Estrella Myrtle at [facility] cardiology  ICD-10 cut over   Hypertension  under control   Hypokalemia 07/08/2021   Hypothyroidism    Internal hemorrhoids 03/03/2020   Leg cramps 11/13/2019   Lower GI bleed 12/12/2012   Lumbago of lumbar region with sciatica 10/21/2020   Lumbar radiculopathy 03/02/2021   Lumbar spondylosis 11/02/2020   NEPHROLITHIASIS 05/13/2009   Qualifier: Diagnosis of  By: Kem Parkinson     Nonspecific abnormal electrocardiogram (ECG) (EKG) 01/06/2013   Numbness of hand 01/24/2022   Onychomycosis 06/30/2015   Osteoarthrosis, 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 Osteoarthritis Of The Hand  10/1 IMO update   Osteoarthrosis, unspecified whether  generalized or localized, pelvic region and thigh 07/25/2010   Formatting of this note might be different from the original. Osteoarthritis Of The Hip   Other abnormal glucose 07/22/2021   Other allergic rhinitis 02/18/2015   Palpitations 07/25/2021   Peripheral neuropathy 06/19/2016   Preventative health care 11/24/2015   Right groin pain 11/29/2012   Right inguinal hernia 05/13/2009   Qualifier: Diagnosis of  By: Kem Parkinson     RLS (restless legs syndrome) 02/18/2015   Rosacea    Rotator cuff tear 07/27/2013   Situational depression 03/02/2021   Skin lesion 01/16/2022   Sleep apnea with use of continuous positive airway pressure (CPAP) 07/15/2013   CPAP set to  7 cm water,  Residual AHi was 7.8  With no leak.  User time 6 hours.  479 days , not used only 12 days - highly compliant 06-23-13  .    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 Fawn Kirk   Ulnar neuropathy 01/24/2022   Urinary retention 11/26/2020   Ventral hernia 07/08/2012   Vertigo 07/08/2021   Weight loss 03/03/2020   Past Surgical History:  Procedure Laterality Date   ABDOMINAL HERNIA REPAIR  07/15/12   Dr Ashley Jacobs   ANTERIOR CERVICAL DECOMP/DISCECTOMY FUSION N/A 07/09/2020   Procedure: ANTERIOR CERVICAL DECOMPRESSION AND FUSION CERVICAL THREE-FOUR.;  Surgeon: Donalee Citrin, MD;  Location: [facility] OR;  Service: Neurosurgery;  Laterality: N/A;  anterior   BROW LIFT  05/07/01   COLONOSCOPY WITH PROPOFOL N/A 04/24/2022   Procedure: COLONOSCOPY WITH PROPOFOL;  Surgeon: Tressia Danas, MD;  Location: [facility] ENDOSCOPY;  Service: Gastroenterology;  Laterality: N/A;   COLONOSCOPY WITH PROPOFOL N/A 04/26/2022   Procedure: COLONOSCOPY WITH  PROPOFOL;  Surgeon: Tressia Danas, MD;  Location: [facility] ENDOSCOPY;  Service: Gastroenterology;  Laterality: N/A;   CYSTOSCOPY WITH URETEROSCOPY AND STENT PLACEMENT Right 01/28/2014   Procedure: CYSTOSCOPY WITH URETEROSCOPY, BASKET RETRIVAL AND  STENT PLACEMENT;  Surgeon: Valetta Fuller, MD;  Location: [facility] ORS;  Service: Urology;  Laterality: Right;   epidural injections     multiple procedures   ESOPHAGEAL DILATION  1993 and 1994   multiple times   EYE SURGERY Bilateral 03/25/10, 2012   cataract removal   HEMORRHOID SURGERY  2014   HIATAL HERNIA REPAIR  12/21/93   HOLMIUM LASER APPLICATION Right 01/28/2014   Procedure: HOLMIUM LASER APPLICATION;  Surgeon: Valetta Fuller, MD;  Location: [facility] ORS;  Service: Urology;  Laterality: Right;   INGUINAL HERNIA REPAIR Right 07/15/12   Dr Ashley Jacobs, x2   JOINT REPLACEMENT  07/25/04   right knee   JOINT REPLACEMENT  07/13/10   left knee   KNEE SURGERY  1995   LAPAROSCOPIC CHOLECYSTECTOMY  09/20/06   with intraoperative cholangiogram and right inguinal herniorrhaphy with mesh    LIGAMENT REPAIR Left 07/2013   shoulder   LITHOTRIPSY     x2   LUMBAR LAMINECTOMY/DECOMPRESSION MICRODISCECTOMY Bilateral 12/27/2022   Procedure: Lumbar Two-Three, Lumbar Three-Four Sublaminar Decompression;  Surgeon: Donalee Citrin, MD;  Location: [facility] OR;  Service: Neurosurgery;  Laterality: Bilateral;   NASAL SEPTUM SURGERY  1975   POLYPECTOMY  04/26/2022   Procedure: POLYPECTOMY;  Surgeon: Tressia Danas, MD;  Location: [facility] ENDOSCOPY;  Service: Gastroenterology;;   POSTERIOR CERVICAL FUSION/FORAMINOTOMY N/A 11/24/2020   Procedure: Posterior Cervical Laminectomy Cervical three-four, Cervical four-five with lateral mass fusion;  Surgeon: Donalee Citrin, MD;  Location: [facility] OR;  Service: Neurosurgery;  Laterality: N/A;   REFRACTIVE SURGERY Bilateral    Right total hip replacement  4/14   SKIN CANCER DESTRUCTION     nose, ear   TONSILLECTOMY  as child   TOTAL HIP ARTHROPLASTY Left     URETHRAL DILATION  1991   Dr. Annabell Howells   Patient Active Problem List   Diagnosis Date Noted   Spinal stenosis of lumbar region 12/27/2022   Drowsiness 12/07/2022   Preop examination 12/06/2022   Weakness of lower extremity 10/10/2022   Skin tear of right elbow without complication 09/18/2022   Skin tear of forearm without complication, left, subsequent encounter 09/18/2022   Fall 09/18/2022   Iron deficiency anemia 09/18/2022   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   Vitamin D deficiency 08/07/2022   Closed fracture of left hip (HCC) 08/07/2022   Hand weakness 05/02/2022   Acute kidney injury (HCC) 05/02/2022   Adenomatous polyp of ascending colon    Adenomatous polyp of colon    Acute metabolic encephalopathy 04/24/2022   GI bleed 04/21/2022   Vasovagal syncope    Hemorrhagic shock (HCC)    Hematochezia    Irregular heart beat 02/03/2022   Constipation 02/03/2022   Carpal tunnel syndrome 01/24/2022   Numbness of hand 01/24/2022   Ulnar neuropathy 01/24/2022   Skin lesion 01/16/2022   History of COVID-19 12/05/2021   Benign paroxysmal positional vertigo 09/14/2021   Palpitations 07/25/2021   Cardiac murmur 07/25/2021   History of chicken pox 07/22/2021   History of hemorrhoids 07/22/2021   COPD (chronic obstructive pulmonary disease) (HCC) 07/22/2021   History of kidney stones 07/22/2021   History of skin cancer 07/22/2021   Complication of anesthesia 07/22/2021   Bilateral sacroiliitis (HCC) 07/22/2021   Other abnormal glucose 07/22/2021   Spondylitis (HCC) 07/22/2021   Greater trochanteric pain syndrome 07/22/2021   Vertigo 07/08/2021   Hypokalemia 07/08/2021   Trochanteric bursitis of left hip 06/30/2021   Cancer (HCC) 04/11/2021   Lumbar radiculopathy 03/02/2021   Gait instability 03/02/2021   Hip pain 03/02/2021   Situational depression 03/02/2021   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    Urinary retention 11/26/2020   Cervical myelopathy (HCC) 11/24/2020   Body mass index (BMI) 25.0-25.9, adult 11/02/2020   Lumbar spondylosis 11/02/2020   Lumbago of lumbar region with sciatica 10/21/2020   Cystoid macular edema of both eyes 07/28/2020   Status post cervical spinal fusion 07/09/2020   History of total bilateral knee replacement 07/08/2020   Aortic atherosclerosis (HCC) 03/05/2020   Emphysema, unspecified (HCC) 03/05/2020   Arthritis 03/04/2020   Diverticulosis 03/03/2020   Internal hemorrhoids 03/03/2020   Weight loss 03/03/2020  Heme positive stool 02/29/2020   Leg cramps 11/13/2019   Tibialis anterior tendon tear, nontraumatic 11/13/2019   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 07/29/2018   CPAP (continuous positive airway pressure) dependence 03/25/2018   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   Erectile dysfunction 06/20/2017   Stenosis of right carotid artery without cerebral infarction 03/06/2017   Peripheral neuropathy 06/19/2016   Asymmetric SNHL (sensorineural hearing loss) 02/29/2016   Hearing loss 02/02/2016   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   Allergic rhinitis 02/02/2016   Atypical chest pain 11/24/2015   Preventative health care 11/24/2015   Onychomycosis 06/30/2015   Degenerative disc disease, lumbar 06/30/2015   Other allergic rhinitis 02/18/2015   Deviated nasal septum 02/18/2015   RLS (restless legs syndrome) 02/18/2015   GERD (gastroesophageal reflux disease) 09/18/2014   Hyperglycemia 03/03/2014   Rotator cuff tear 07/27/2013   Sleep apnea with use of continuous positive airway pressure (CPAP) 07/15/2013   Carotid stenosis 02/24/2013   Nonspecific abnormal electrocardiogram (ECG) (EKG) 01/06/2013   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   Lower GI bleed 12/12/2012   Right groin pain 11/29/2012   OSA (obstructive sleep apnea) 11/25/2012   Ventral hernia 07/08/2012   High risk medication use 01/18/2012   Dyspnea 06/12/2011    Hyperlipidemia 12/27/2010   Osteoarthrosis, unspecified whether generalized or localized, pelvic region and thigh 07/25/2010   Osteoarthrosis, hand 06/13/2010   Anemia 12/17/2009   Hypothyroidism 05/13/2009   GOUT 05/13/2009   Right inguinal hernia 05/13/2009   HIATAL HERNIA 05/13/2009   NEPHROLITHIASIS 05/13/2009   ROSACEA 05/13/2009   Hypertension 05/13/2009    PCP: [name], NP   REFERRING PROVIDER: Susa Raring., MD   REFERRING DIAG:  M25.562, G89.29 (ICD-10-CM) - Chronic pain in left knee  M25.561, G89.29 (ICD-10-CM) - Chronic pain in right knee   THERAPY DIAG:  Chronic pain of both knees  Muscle weakness (generalized)  Unsteadiness on feet  Other abnormalities of gait and mobility  Repeated falls  Other low back pain  RATIONALE FOR EVALUATION AND TREATMENT: Rehabilitation  ONSET DATE: off and on for a couple years  NEXT MD VISIT: none scheduled   SUBJECTIVE:                                                                                                                                                                                                         SUBJECTIVE STATEMENT: Pt notes some wrist and elbow pain today but denies LBP or LE pain.  No recent falls.  PAIN: Are you having pain? No - B  knees  Are you having pain? No - low back   PERTINENT HISTORY:  L2-3, L3-4 Sublaminar Decompression on 12/27/22; Revision of femoral component of L THA on 07/02/22 s/p fall with periprosthetic fracture; Aortic atherosclerosis; carotid stenosis; cervical myelopathy s/p ACDF 06/2020 and posterior cervical fusion 11/2020; peripheral neuropathy; lumbar DDD/spondylosis; lumbago with sciatica/lumbar radiculopathy; OA; B TKA; B THA; L greater trochanteric pain syndrome; L RCR 2014; CTR; hearing loss; HTN; BPPV/vertigo; B sacroiliitis; skin cancer; COPD/emphysema; sleep apnea with CPAP; GERD; gout; HLD; LE cramps; RLS   PRECAUTIONS: Fall  WEIGHT BEARING  RESTRICTIONS: No  FALLS:  Has patient fallen in last 6 months? Yes. 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  No fracture or bony lesion or other findings.  Impression: Right knee replacement in good condition.   01/31/23 - L knee x-ray: No fracture or bony lesion or arthritic change or other abnormality.  Left  knee replacement looks well-fixed and in excellent position.  Impression: Knee replacement in good condition.   PATIENT SURVEYS:  LEFS 31 / 80 = 38.8 % 05/10/23: 32 / 80 = 40.0 %  COGNITION: Overall cognitive status: Within functional limits for tasks assessed and History of cognitive impairments - at baseline    SENSATION: Intermittent peripheral neuropathy in B feet  MUSCLE LENGTH: TBA Hamstrings:  ITB:  Piriformis:  Hip flexors:  Quads:  Heelcord:   POSTURE:  rounded shoulders, forward head, and flexed trunk   LOWER EXTREMITY ROM: Limited B hip and ankle ROM Active ROM Right eval Left eval Right 04/03/23 Left 04/03/23 R/L 05/28/23 R/L 06/28/23  Knee flexion 104 96 110 108  108/108  Knee extension  -10 -5 -4 -2 4 in LAQ/ 1 in LAQ -4/-2 in LAQ 0/0 supported   Passive ROM Right eval Left eval  Knee flexion    Knee extension 0 0  (Blank rows = not tested)  LOWER EXTREMITY MMT:  MMT Right Eval* Left   Eval* R * 05/10/23 L * 05/10/23 R 06/21/23 L 06/21/23 R 07/16/23 L 07/16/23  Hip flexion 3+ 4 3+ 4- 4- 4 4 4+  Hip extension 3- 3+ 3+ 3+ 4 4+ 4 (tested in S/L) 4 (tested in S/L)  Hip abduction 4- 4 4 4 4 4  4- 4-  Hip adduction 4 4 4 4 4  4+ 4- 4-  Hip internal rotation 3+ 4- 3+ 4-   3+ 4-  Hip external rotation 2+ 3- 2+ 3-   3- 3-  Knee flexion 4 4 4+ 4+   4+ 4+  Knee extension 4 4+ 4 4 4  4+  4+ 5  Ankle dorsiflexion 4+ 4 4+ 4-   4+ 4  Ankle plantarflexion          Ankle inversion          Ankle eversion           (Blank rows = not tested, *tested in sitting)  FUNCTIONAL TESTS: (Remaining tests to be assessed next visit with PT) 5 times sit to stand: 18.94 sec w/o UE assist (uncontrolled descent on most reps) (03/22/23) Timed up and go (TUG): 21.29 sec with RW (03/22/23) 10 meter walk test: 21.13 sec with RW; Gait speed = 1.55 ft/sec  Berg Balance Scale: 35/56; <36 = High risk for falls (close to 100%) (03/22/23) Dynamic Gait Index: 11/24; Scores of 19 or less are predictive of falls in older community living adults (03/22/23)  04/17/23: TUG: 20 sec  5xSTS: 18 sec  05/10/23: 5xSTS: 13.31 sec w/o UE assist TUG: 21.06 with RW, difficulty with turning : 14.25 sec with RW Gait speed: 2.30 ft/sec with RW DGI: 13/24  05/17/23: Sharlene Motts: 32/56  06/14/23: TUG = 17.21 sec - Goal met  06/26/23:  Sharlene Motts: 38/56  06/28/23: : 12.72 sec with RW Gait speed = 2.58 ft/sec with RW DGI: 14/24  07/30/23: Sharlene Motts = 41/56   GAIT: Distance walked: 60 ft Assistive device utilized: Environmental consultant - 2 wheeled Level of assistance: SBA Gait pattern: step through pattern, decreased stride length, decreased hip/knee flexion- Right, decreased hip/knee flexion- Left, decreased ankle dorsiflexion- Right,  decreased ankle dorsiflexion- Left, trunk flexed, poor foot clearance- Right, and poor foot clearance- Left Comments: Cues necessary for improved upright posture and rolling walker proximity   TODAY'S TREATMENT:   07/30/23  THERAPEUTIC EXERCISE: to improve flexibility, strength and mobility.  Demonstration, verbal and tactile cues throughout for technique. NuStep - L6 x 6 min BATCA knee flexion 25# 2 x 10 BATCA knee extension 25# 2 x 10  THERAPEUTIC ACTIVITIES: Sharlene Motts = 41/56  GAIT TRAINING: To normalize gait pattern and improve safety with collapsible RW . 220' with collapsible RW - continued cues for upright posture and RW proximity Stairs: Level of Assistance: SBA Stair Negotiation Technique: Step to Pattern Alternating Pattern  With use of AD: SPC  with Single Rail on Left Number of Stairs: 14  Height of Stairs: 7"  Comments: mixed alternating and step-to patter on ascent with use of SPC on R, step-to on descent w/o need for [facility]   07/26/23  THERAPEUTIC EXERCISE: to improve flexibility, strength and mobility.  Demonstration, verbal and tactile cues throughout for technique. NuStep - L6 x 6 min Seated row 25# 2x10 low grips Seated bicep curls 5lb 2 x 10 BUE Standing calf raises 2x10 - 3 sec hold at top Knee extension 20# x 20 reps BLE  GAIT TRAINING: To normalize gait pattern and improve safety with RW. 400' with collapsible RW - better L foot clearance but continued cues for upright posture and RW proximity   07/24/23  THERAPEUTIC EXERCISE: to improve flexibility, strength and mobility.  Demonstration, verbal and tactile cues throughout for technique. NuStep - L6 x 6 min Knee flexion 20# 2x12 BLE; 10# x 12 R/L Knee extension 15# 2x12BLE; 5# x 12 R/L Calf raises 2x10 - 3 sec hold Seated row 25# 2x10 Seated bicep curls 5# 2x10    PATIENT EDUCATION:  Education details: progress with PT, ongoing PT POC, and gait safety with RW   Person educated: Patient Education  method: Explanation, Demonstration, and Verbal cues Education comprehension: verbalized understanding,  verbal cues required, and needs further education  HOME EXERCISE PROGRAM: Access Code: [email or web address] URL: [email or web address] Date: 06/28/2023 Prepared by: Glenetta Hew  Exercises - Seated Hamstring Stretch with Strap  - 1 x daily - 7 x weekly - 3 sets - 30 sec hold - Seated Quad Set  - 1 x daily - 7 x weekly - 3 sets - 10 reps - 5 seconds hold - Seated Hip Adduction Squeeze with Ball  - 1 x daily - 7 x weekly - 3 sets - 10 reps - 5 secs hold - Supine Quad Set on Towel Roll  - 1 x daily - 7 x weekly - 2 sets - 10 reps - 3 sec hold - Standing Gastroc Stretch at Counter  - 1 x daily - 7 x weekly - 3 sets - 3 reps - 30 sec hold - Seated Shoulder Horizontal Abduction with Resistance - Palms Down  - 1 x daily - 7 x weekly - 3 sets - 10 reps - Seated Anti-Rotation Press With Anchored Resistance  - 1 x daily - 7 x weekly - 3 sets - 10 reps - Standing Hip Abduction with Counter Support  - 1 x daily - 3 x weekly - 2 sets - 10 reps - Standing Hip Extension with Counter Support  - 1 x daily - 3 x weekly - 2 sets - 10 reps - Standing Knee Flexion with Counter Support  - 1 x daily - 3 x weekly - 2 sets - 10 reps - Seated Long Arc Quad  - 1 x daily - 3 x weekly - 2 sets - 10 reps - Seated March  - 1 x daily - 3 x weekly - 2 sets - 10 reps - Seated Hip Abduction with Resistance  - 1 x daily - 3 x weekly - 2 sets - 10 reps - 3-5 sec hold - Squat with Chair and Counter Support  - 1 x daily - 3 x weekly - 2 sets - 10 reps - 3 sec hold   ASSESSMENT:  CLINICAL IMPRESSION: Zackrey Schlicher" reports he is getting around better - able to walk up the driveway over the weekend (something he typically avoids).  He continues to require frequent cues for upright posture and RW proximity when ambulating with his collapsible RW due to the folding cross-bar.  He is maneuvering better on the stairs but  varies between reciprocal and step-to pattern.  Balance continues to improve with Berg score increased to 41/56.  [name] is nearing the end of his current POC and should be ready to transition to his HEP at that time, therefore will plan to review and update/consolidate the HEP as indicated next visit to prepare for discharge.    OBJECTIVE IMPAIRMENTS: Abnormal gait, decreased activity tolerance, decreased balance, decreased coordination, decreased endurance, decreased knowledge of condition, decreased knowledge of use of DME, decreased mobility, difficulty walking, decreased ROM, decreased strength, decreased safety awareness, increased fascial restrictions, impaired perceived functional ability, increased muscle spasms, impaired flexibility, impaired sensation, improper body mechanics, postural dysfunction, and pain.   ACTIVITY LIMITATIONS: carrying, lifting, bending, sitting, standing, squatting, sleeping, stairs, transfers, bed mobility, bathing, locomotion level, and caring for others  PARTICIPATION LIMITATIONS: meal prep, cleaning, laundry, driving, shopping, and community activity  PERSONAL FACTORS: Age, Behavior pattern, Fitness, Past/current experiences, Time since onset of injury/illness/exacerbation, and 3+ comorbidities: L2-3, L3-4 Sublaminar Decompression on 12/27/22; Revision of femoral component of L THA on 07/02/22 s/p fall with periprosthetic fracture; Aortic atherosclerosis; carotid stenosis; cervical  myelopathy 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   REHAB POTENTIAL: Good  CLINICAL DECISION MAKING: Evolving/moderate  complexity  EVALUATION COMPLEXITY: Moderate   GOALS: Goals reviewed with patient? Yes  SHORT TERM GOALS: Target date: 04/10/2023, extended to 06/07/2023  Patient will be independent with initial HEP. Baseline:  Goal status: MET   2.  Complete balance testing and establish additional STG's/LTG's as appropriate. Baseline:  Goal status: MET  03/22/23  3.  Patient will improve 5x STS time to </= 16 seconds with good eccentric control for improved efficiency and safety with transfers Baseline: 18.94 sec w/o UE assist (uncontrolled descent on most reps) Goal status: MET  05/10/23 - 13.31 sec w/o UE assist with good stability upon standing and improved eccentric control  4.  Patient will demonstrate decreased TUG time to </= 18 sec to decrease risk for falls with transitional mobility Baseline: 21.29 sec with RW; 04/17/23 - 20 sec with RW Goal status: MET 06/14/23 - 17.21 sec  LONG TERM GOALS: Target date: 05/08/2023, extended to 07/05/2023, extended to 08/09/2023  Patient will be independent with advanced/ongoing HEP to improve outcomes and carryover.  Baseline:  Goal status: IN PROGRESS  06/28/23 - met for current HEP with exceptions of modifications made today  2.  Patient will report at least 50-75% improvement in B knee pain with no further instances of subluxation to improve QOL. Baseline: 5/10 Goal status: MET  05/10/23 - no recent knee pain reported  3.  Patient will demonstrate improved B knee extension AROM to [facility] to allow for improved stability for normal gait and stair mechanics. Baseline: Refer to above LE ROM table Goal status: MET  06/28/23 - knee ROM has plateaued but adequate for normal mobility, gait and stair negotiation  4.  Patient will demonstrate improved B LE strength to >/= 4 to 4+/5 for improved stability and ease of mobility. Baseline: Refer to above LE MMT table Goal status: PARTIALLY MET  07/16/23 - proximal LE strength gradually improving  5.  Patient will be  able to ambulate 400' with LRAD and normal gait pattern without increased pain to access community.  Baseline:  Goal status: IN PROGRESS  07/26/23 - 400' with RW and frequent cues for upright posture and improved RW proximity; increased L hip flexion during swing phase, halfway through decreased overall gait speed and decreased stride length  6. Patient will be able to ascend/descend stairs with 1 HR and reciprocal step pattern safely to access home and community.  Baseline:  Goal status: PARTIALLY MET  07/30/23 - able to complete intermittent reciprocal ascent with left HR with SPC with supervision/SBA of PT, step to pattern on descent with continued need for supervision/SBA of PT  7.  Patient will report >/= 40/80 on LEFS to demonstrate improved functional ability. Baseline: 31 / 80 = 38.8 %; 05/10/23 - 32 / 80 = 40.0 % Goal status: MET  06/28/23 -  40 / 80 = 50.0 %  8.  Patient will demonstrate at least 19/24 on DGI to decrease risk of falls. Baseline: 11/24 (03/22/23);  13/24 (05/10/23)  Goal status: IN PROGRESS  06/28/23 - 14/24  9.  Patient will improve Berg score by at least 8  points to improve safety and stability with ADLs in standing and reduce risk for falls. Baseline: 35/56 (03/22/23);  32/56 (05/17/23);  38/56 (06/26/23) Goal status: PARTIALLY MET  07/30/23 - 41/56 (6 point improvement from eval, 9 point improvement from lowest score)   PLAN:  PT FREQUENCY: 2x/week  PT DURATION: 4-6 weeks  PLANNED INTERVENTIONS: 16109- PT Re-evaluation, 97110-Therapeutic exercises, 97530- Therapeutic activity, 97112- Neuromuscular re-education, 97535- Self Care, 60454- Manual therapy, [id number]- Gait training, [phone]- Aquatic Therapy, 97014- Electrical stimulation (unattended), 97035- Ultrasound, Patient/Family education, Balance training, Stair training, Taping, Dry Needling, Joint mobilization, Spinal mobilization, DME instructions, Cryotherapy, Moist heat, Therapeutic exercises, Therapeutic activity,  Neuromuscular re-education, Gait training, and Self Care  PLAN FOR NEXT SESSION: review and update/consolidated HEP as indicated; complete remaining goal assessment with anticipated transition to HEP as of final visit on 08/08/23   Marry Guan, PT 07/30/2023, 5:48 PM

## 2023-08-02 ENCOUNTER — Encounter: Payer: Self-pay | Admitting: Physician Assistant

## 2023-08-02 ENCOUNTER — Encounter: Payer: Medicare HMO | Admitting: Physical Therapy

## 2023-08-02 ENCOUNTER — Other Ambulatory Visit: Payer: Self-pay | Admitting: Physician Assistant

## 2023-08-02 ENCOUNTER — Ambulatory Visit
Admission: RE | Admit: 2023-08-02 | Discharge: 2023-08-02 | Disposition: A | Discharge status: Home / Self Care | Payer: Medicare HMO | Source: Ambulatory Visit | Attending: Physician Assistant | Admitting: Physician Assistant

## 2023-08-02 DIAGNOSIS — M19012 Primary osteoarthritis, left shoulder: Secondary | ICD-10-CM | POA: Diagnosis not present

## 2023-08-02 DIAGNOSIS — S42209A Unspecified fracture of upper end of unspecified humerus, initial encounter for closed fracture: Secondary | ICD-10-CM

## 2023-08-02 DIAGNOSIS — M48062 Spinal stenosis, lumbar region with neurogenic claudication: Secondary | ICD-10-CM | POA: Diagnosis not present

## 2023-08-02 DIAGNOSIS — M25512 Pain in left shoulder: Secondary | ICD-10-CM | POA: Diagnosis not present

## 2023-08-02 DIAGNOSIS — M25522 Pain in left elbow: Secondary | ICD-10-CM | POA: Diagnosis not present

## 2023-08-06 ENCOUNTER — Ambulatory Visit: Payer: Medicare HMO

## 2023-08-06 ENCOUNTER — Telehealth: Payer: Self-pay | Admitting: Family

## 2023-08-06 NOTE — Telephone Encounter (Signed)
Pt called and stated that his label on his medication for Hydralazine says to take 1 tablet/ 2x daily although he was previously taking it 3x/daily.  Please call pt to clarify frequency for medication.

## 2023-08-07 NOTE — Telephone Encounter (Signed)
Patient notified he should be taking this 3 times a day

## 2023-08-08 ENCOUNTER — Ambulatory Visit: Payer: Medicare HMO | Admitting: Physical Therapy

## 2023-08-08 ENCOUNTER — Encounter: Payer: Self-pay | Admitting: Physical Therapy

## 2023-08-08 DIAGNOSIS — R2681 Unsteadiness on feet: Secondary | ICD-10-CM | POA: Diagnosis not present

## 2023-08-08 DIAGNOSIS — M25562 Pain in left knee: Secondary | ICD-10-CM | POA: Diagnosis not present

## 2023-08-08 DIAGNOSIS — R2689 Other abnormalities of gait and mobility: Secondary | ICD-10-CM | POA: Diagnosis not present

## 2023-08-08 DIAGNOSIS — M5459 Other low back pain: Secondary | ICD-10-CM | POA: Diagnosis not present

## 2023-08-08 DIAGNOSIS — M25561 Pain in right knee: Secondary | ICD-10-CM | POA: Diagnosis not present

## 2023-08-08 DIAGNOSIS — R296 Repeated falls: Secondary | ICD-10-CM | POA: Diagnosis not present

## 2023-08-08 DIAGNOSIS — M6281 Muscle weakness (generalized): Secondary | ICD-10-CM | POA: Diagnosis not present

## 2023-08-08 DIAGNOSIS — G8929 Other chronic pain: Secondary | ICD-10-CM | POA: Diagnosis not present

## 2023-08-08 NOTE — Therapy (Signed)
OUTPATIENT PHYSICAL THERAPY TREATMENT / DISCHARGE SUMMARY    Patient Name: [name] Pratt MRN: [id number] DOB:05/21/1941, 82 y.o., male Today's Date: 08/08/2023   END OF SESSION:  PT End of Session - 08/08/23 1140     Visit Number 28    Date for PT Re-Evaluation 08/09/23    Authorization Type [name]    Authorization Time Period 07/02/23 - 08/09/23    Authorization - Visit Number 7    Authorization - Number of Visits 10    Progress Note Due on Visit 31   Recert on visit #21 - 06/28/23   PT Start Time 1140    PT Stop Time 1236    PT Time Calculation (min) 56 min    Activity Tolerance Patient tolerated treatment well    Behavior During Therapy [facility] for tasks assessed/performed                   Past Medical History:  Diagnosis Date   Allergic rhinitis 02/02/2016   Anemia 12/17/2009   Formatting of this note might be different from the original. 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 Hypertension   Benign paroxysmal positional vertigo 09/14/2021   Bilateral sacroiliitis (HCC) 07/22/2021   Body mass index (BMI) 25.0-25.9, adult 11/02/2020   Cancer (HCC)    skin cancer   Cardiac murmur 07/25/2021   Carotid stenosis    a. Carotid U/S 5/13: LICA < 50%, RICA 50-69%;  b.  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   Cystoid  macular edema of both eyes 07/28/2020   Degenerative disc disease, lumbar 06/30/2015   Deviated nasal septum 02/18/2015   Diverticulosis 03/03/2020   DJD (degenerative joint disease) of hip 12/24/2012   Dyspnea 06/12/2011   Emphysema, unspecified (HCC) 03/05/2020   Erectile dysfunction 06/20/2017   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   Gait disorder 03/02/2021   GERD (gastroesophageal reflux disease)    GOUT 05/13/2009   Qualifier: Diagnosis of  By: Kem Parkinson     Greater trochanteric pain syndrome 07/22/2021   Hand pain, left 01/16/2022   Hearing loss 02/02/2016   Heme positive stool 02/29/2020   HIATAL HERNIA 05/13/2009   Qualifier: Diagnosis of  By: Kem Parkinson     High risk medication use 01/18/2012   Hip pain 03/02/2021   History of chicken pox    History of COVID-19 12/05/2021   History of hemorrhoids    History of kidney stones    History of skin cancer 07/22/2021   skin cancer   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 Cardiology - Dr Estrella Myrtle at [facility] cardiology  ICD-10 cut over   Hypertension  under control   Hypokalemia 07/08/2021   Hypothyroidism    Internal hemorrhoids 03/03/2020   Leg cramps 11/13/2019   Lower GI bleed 12/12/2012   Lumbago of lumbar region with sciatica 10/21/2020   Lumbar radiculopathy 03/02/2021   Lumbar spondylosis 11/02/2020   NEPHROLITHIASIS 05/13/2009   Qualifier: Diagnosis of  By: Kem Parkinson     Nonspecific abnormal electrocardiogram (ECG) (EKG) 01/06/2013   Numbness of hand 01/24/2022   Onychomycosis 06/30/2015   Osteoarthrosis, 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 Osteoarthritis Of The Hand  10/1 IMO update   Osteoarthrosis,  unspecified whether generalized or localized, pelvic region and thigh 07/25/2010   Formatting of this note might be different from the original. Osteoarthritis Of The Hip   Other abnormal glucose 07/22/2021   Other allergic rhinitis 02/18/2015   Palpitations 07/25/2021   Peripheral neuropathy 06/19/2016   Preventative health care 11/24/2015   Right groin pain 11/29/2012   Right inguinal hernia 05/13/2009   Qualifier: Diagnosis of  By: Kem Parkinson     RLS (restless legs syndrome) 02/18/2015   Rosacea    Rotator cuff tear 07/27/2013   Situational depression 03/02/2021   Skin lesion 01/16/2022   Sleep apnea with use of continuous positive airway pressure (CPAP) 07/15/2013   CPAP set to  7 cm water,  Residual AHi was 7.8  With no leak.  User time 6 hours.  479 days , not used only 12 days - highly compliant 06-23-13  .    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 Fawn Kirk   Ulnar neuropathy 01/24/2022   Urinary retention 11/26/2020   Ventral hernia 07/08/2012   Vertigo 07/08/2021   Weight loss 03/03/2020   Past Surgical History:  Procedure Laterality Date   ABDOMINAL HERNIA REPAIR  07/15/12   Dr Ashley Jacobs   ANTERIOR CERVICAL DECOMP/DISCECTOMY FUSION N/A 07/09/2020   Procedure: ANTERIOR CERVICAL DECOMPRESSION AND FUSION CERVICAL THREE-FOUR.;  Surgeon: Donalee Citrin, MD;  Location: [facility] OR;  Service: Neurosurgery;  Laterality: N/A;  anterior   BROW LIFT  05/07/01   COLONOSCOPY WITH PROPOFOL N/A 04/24/2022   Procedure: COLONOSCOPY WITH PROPOFOL;  Surgeon: Tressia Danas, MD;  Location: [facility] ENDOSCOPY;  Service: Gastroenterology;  Laterality: N/A;   COLONOSCOPY WITH PROPOFOL N/A 04/26/2022   Procedure:  COLONOSCOPY WITH PROPOFOL;  Surgeon: Tressia Danas, MD;  Location: [facility] ENDOSCOPY;  Service: Gastroenterology;  Laterality: N/A;   CYSTOSCOPY WITH URETEROSCOPY AND STENT PLACEMENT Right 01/28/2014   Procedure: CYSTOSCOPY WITH URETEROSCOPY, BASKET RETRIVAL AND  STENT PLACEMENT;  Surgeon: Valetta Fuller, MD;  Location: [facility] ORS;  Service: Urology;  Laterality: Right;   epidural injections     multiple procedures   ESOPHAGEAL DILATION  1993 and 1994   multiple times   EYE SURGERY Bilateral 03/25/10, 2012   cataract removal   HEMORRHOID SURGERY  2014   HIATAL HERNIA REPAIR  12/21/93   HOLMIUM LASER APPLICATION Right 01/28/2014   Procedure: HOLMIUM LASER APPLICATION;  Surgeon: Valetta Fuller, MD;  Location: [facility] ORS;  Service: Urology;  Laterality: Right;   INGUINAL HERNIA REPAIR Right 07/15/12   Dr Ashley Jacobs, x2   JOINT REPLACEMENT  07/25/04   right knee   JOINT REPLACEMENT  07/13/10   left knee   KNEE SURGERY  1995   LAPAROSCOPIC CHOLECYSTECTOMY  09/20/06   with intraoperative cholangiogram and right inguinal herniorrhaphy with mesh    LIGAMENT REPAIR Left 07/2013   shoulder   LITHOTRIPSY     x2   LUMBAR LAMINECTOMY/DECOMPRESSION MICRODISCECTOMY Bilateral 12/27/2022   Procedure: Lumbar Two-Three, Lumbar Three-Four Sublaminar Decompression;  Surgeon: Donalee Citrin, MD;  Location: [facility] OR;  Service: Neurosurgery;  Laterality: Bilateral;   NASAL SEPTUM SURGERY  1975   POLYPECTOMY  04/26/2022   Procedure: POLYPECTOMY;  Surgeon: Tressia Danas, MD;  Location: [facility] ENDOSCOPY;  Service: Gastroenterology;;   POSTERIOR CERVICAL FUSION/FORAMINOTOMY N/A 11/24/2020   Procedure: Posterior Cervical Laminectomy Cervical three-four, Cervical four-five with lateral mass fusion;  Surgeon: Donalee Citrin, MD;  Location: [facility] OR;  Service: Neurosurgery;  Laterality: N/A;   REFRACTIVE SURGERY Bilateral    Right total hip replacement  4/14   SKIN CANCER DESTRUCTION     nose, ear   TONSILLECTOMY  as child   TOTAL HIP  ARTHROPLASTY Left    URETHRAL DILATION  1991   Dr. Annabell Howells   Patient Active Problem List   Diagnosis Date Noted   Spinal stenosis of lumbar region 12/27/2022   Drowsiness 12/07/2022   Preop examination 12/06/2022   Weakness of lower extremity 10/10/2022   Skin tear of right elbow without complication 09/18/2022   Skin tear of forearm without complication, left, subsequent encounter 09/18/2022   Fall 09/18/2022   Iron deficiency anemia 09/18/2022   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   Vitamin D deficiency 08/07/2022   Closed fracture of left hip (HCC) 08/07/2022   Hand weakness 05/02/2022   Acute kidney injury (HCC) 05/02/2022   Adenomatous polyp of ascending colon    Adenomatous polyp of colon    Acute metabolic encephalopathy 04/24/2022   GI bleed 04/21/2022   Vasovagal syncope    Hemorrhagic shock (HCC)    Hematochezia    Irregular heart beat 02/03/2022   Constipation 02/03/2022   Carpal tunnel syndrome 01/24/2022   Numbness of hand 01/24/2022   Ulnar neuropathy 01/24/2022   Skin lesion 01/16/2022   History of COVID-19 12/05/2021   Benign paroxysmal positional vertigo 09/14/2021   Palpitations 07/25/2021   Cardiac murmur 07/25/2021   History of chicken pox 07/22/2021   History of hemorrhoids 07/22/2021   COPD (chronic obstructive pulmonary disease) (HCC) 07/22/2021   History of kidney stones 07/22/2021   History of skin cancer 07/22/2021   Complication of anesthesia 07/22/2021   Bilateral sacroiliitis (HCC) 07/22/2021   Other abnormal glucose 07/22/2021   Spondylitis (HCC) 07/22/2021   Greater trochanteric pain syndrome 07/22/2021   Vertigo 07/08/2021   Hypokalemia 07/08/2021   Trochanteric bursitis of left hip 06/30/2021   Cancer (HCC) 04/11/2021   Lumbar radiculopathy 03/02/2021   Gait instability 03/02/2021   Hip pain 03/02/2021   Situational depression 03/02/2021   Exudative age-related macular degeneration of right eye with active choroidal  neovascularization (HCC) 02/09/2021   Urinary retention 11/26/2020   Cervical myelopathy (HCC) 11/24/2020   Body mass index (BMI) 25.0-25.9, adult 11/02/2020   Lumbar spondylosis 11/02/2020   Lumbago of lumbar region with sciatica 10/21/2020   Cystoid macular edema of both eyes 07/28/2020   Status post cervical spinal fusion 07/09/2020   History of total bilateral knee replacement 07/08/2020   Aortic atherosclerosis (HCC) 03/05/2020   Emphysema, unspecified (HCC) 03/05/2020   Arthritis 03/04/2020   Diverticulosis 03/03/2020   Internal hemorrhoids 03/03/2020   Weight loss 03/03/2020  Heme positive stool 02/29/2020   Leg cramps 11/13/2019   Tibialis anterior tendon tear, nontraumatic 11/13/2019   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 07/29/2018   CPAP (continuous positive airway pressure) dependence 03/25/2018   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   Erectile dysfunction 06/20/2017   Stenosis of right carotid artery without cerebral infarction 03/06/2017   Peripheral neuropathy 06/19/2016   Asymmetric SNHL (sensorineural hearing loss) 02/29/2016   Hearing loss 02/02/2016   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   Allergic rhinitis 02/02/2016   Atypical chest pain 11/24/2015   Preventative health care 11/24/2015   Onychomycosis 06/30/2015   Degenerative disc disease, lumbar 06/30/2015   Other allergic rhinitis 02/18/2015   Deviated nasal septum 02/18/2015   RLS (restless legs syndrome) 02/18/2015   GERD (gastroesophageal reflux disease) 09/18/2014   Hyperglycemia 03/03/2014   Rotator cuff tear 07/27/2013   Sleep apnea with use of continuous positive airway pressure (CPAP) 07/15/2013   Carotid stenosis 02/24/2013   Nonspecific abnormal electrocardiogram (ECG) (EKG) 01/06/2013   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   Lower GI bleed 12/12/2012   Right groin pain 11/29/2012   OSA (obstructive sleep apnea) 11/25/2012   Ventral hernia 07/08/2012   High risk medication use  01/18/2012   Dyspnea 06/12/2011   Hyperlipidemia 12/27/2010   Osteoarthrosis, unspecified whether generalized or localized, pelvic region and thigh 07/25/2010   Osteoarthrosis, hand 06/13/2010   Anemia 12/17/2009   Hypothyroidism 05/13/2009   GOUT 05/13/2009   Right inguinal hernia 05/13/2009   HIATAL HERNIA 05/13/2009   NEPHROLITHIASIS 05/13/2009   ROSACEA 05/13/2009   Hypertension 05/13/2009    PCP: [name], NP   REFERRING PROVIDER: Susa Raring., MD   REFERRING DIAG:  M25.562, G89.29 (ICD-10-CM) - Chronic pain in left knee  M25.561, G89.29 (ICD-10-CM) - Chronic pain in right knee   THERAPY DIAG:  Chronic pain of both knees  Muscle weakness (generalized)  Unsteadiness on feet  Other abnormalities of gait and mobility  Repeated falls  Other low back pain  RATIONALE FOR EVALUATION AND TREATMENT: Rehabilitation  ONSET DATE: off and on for a couple years  NEXT MD VISIT: none scheduled   SUBJECTIVE:                                                                                                                                                                                                         SUBJECTIVE STATEMENT: Pt reports new pain in L>R elbows after losing his balance while trying to step out of his pants resulting in a fall where he hit his  elbows on the walker as he was going down.  Seen by urgent care with no new fractures identified.  No knee or back pain currently, but back still bothers him some with standing and when in bed.  Recent visit with neurosurgeon - Pt reports plan for injections q3 months.   PAIN: Are you having pain? No - B knees  Are you having pain? No - low back   Are you having pain? Yes: NPRS scale: 0/10, up to 5/10 Pain location: L>R elbow Pain description: hard ache, burning Aggravating factors: fall, pressure on elbow or pushing up with arms during STS Relieving factors: ceasing triggering activity  PERTINENT  HISTORY:  L2-3, L3-4 Sublaminar Decompression on 12/27/22; Revision of femoral component of L THA on 07/02/22 s/p fall with periprosthetic fracture; Aortic atherosclerosis; carotid stenosis; cervical myelopathy s/p ACDF 06/2020 and posterior cervical fusion 11/2020; peripheral neuropathy; lumbar DDD/spondylosis; lumbago with sciatica/lumbar radiculopathy; OA; B TKA; B THA; L greater trochanteric pain syndrome; L RCR 2014; CTR; hearing loss; HTN; BPPV/vertigo; B sacroiliitis; skin cancer; COPD/emphysema; sleep apnea with CPAP; GERD; gout; HLD; LE cramps; RLS   PRECAUTIONS: Fall  WEIGHT BEARING RESTRICTIONS: No  FALLS:  Has patient fallen in last 6 months? Yes. 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  No fracture or bony lesion or other findings.  Impression: Right knee replacement in good condition.   01/31/23 - L knee x-ray: No fracture or bony lesion or arthritic change or other abnormality.  Left  knee replacement looks well-fixed and in excellent position.  Impression: Knee replacement in good condition.   PATIENT SURVEYS:   LEFS 31 / 80 = 38.8 % 05/10/23: 32 / 80 = 40.0 %  COGNITION: Overall cognitive status: Within functional limits for tasks assessed and History of cognitive impairments - at baseline    SENSATION: Intermittent peripheral neuropathy in B feet  MUSCLE LENGTH: TBA Hamstrings:  ITB:  Piriformis:  Hip flexors:  Quads:  Heelcord:   POSTURE:  rounded shoulders, forward head, and flexed trunk   LOWER EXTREMITY ROM: Limited B hip and ankle ROM Active ROM Right eval Left eval Right 04/03/23 Left 04/03/23 R/L 05/28/23 R/L 06/28/23  Knee flexion 104 96 110 108  108/108  Knee extension -10 -5 -4 -2 4 in LAQ/ 1 in LAQ -4/-2 in LAQ 0/0 supported   Passive ROM Right eval Left eval  Knee flexion    Knee extension 0 0  (Blank rows = not tested)  LOWER EXTREMITY MMT:  MMT Right Eval* Left   Eval* R * 05/10/23 L * 05/10/23 R 06/21/23  L 06/21/23 R 07/16/23 L 07/16/23 R  08/08/23 L  08/08/23  Hip flexion 3+ 4 3+ 4- 4- 4 4 4+ 4 4+  Hip extension 3- 3+ 3+ 3+ 4 4+ 4 (tested in S/L) 4 (tested in S/L) 4+ (tested in S/L) 4+ (tested in S/L)  Hip abduction 4- 4 4 4 4 4  4- 4- 4- 4-  Hip adduction 4 4 4 4 4  4+ 4- 4- 4- 4-  Hip internal rotation 3+ 4- 3+ 4-   3+ 4- 3+ 4-  Hip external rotation 2+ 3- 2+ 3-   3- 3- 3- 3-  Knee flexion 4 4 4+ 4+   4+ 4+ 4+ 4+  Knee extension 4 4+ 4 4 4  4+ 4+ 5 4+ 5  Ankle dorsiflexion 4+ 4 4+ 4-   4+ 4 4+ 4+  Ankle plantarflexion            Ankle inversion            Ankle eversion             (Blank rows = not tested, *tested in sitting)  FUNCTIONAL TESTS: (Remaining tests to be assessed next visit with PT) 5 times sit to stand: 18.94 sec w/o UE assist (uncontrolled descent on most reps) (03/22/23) Timed up and go (TUG): 21.29 sec with RW (03/22/23) 10 meter walk test: 21.13 sec with RW; Gait speed = 1.55 ft/sec  Berg Balance Scale: 35/56; <36 = High risk for falls (close to 100%) (03/22/23) Dynamic Gait Index: 11/24; Scores of 19 or less are predictive of falls in  older community living adults (03/22/23)  04/17/23: TUG: 20 sec  5xSTS: 18 sec  05/10/23: 5xSTS: 13.31 sec w/o UE assist TUG: 21.06 with RW, difficulty with turning : 14.25 sec with RW Gait speed: 2.30 ft/sec with RW DGI: 13/24  05/17/23: Sharlene Motts: 32/56  06/14/23: TUG = 17.21 sec - Goal met  06/26/23:  Sharlene Motts: 38/56  06/28/23: : 12.72 sec with RW Gait speed = 2.58 ft/sec with RW DGI: 14/24  07/30/23: Sharlene Motts = 41/56   GAIT: Distance walked: 60 ft Assistive device utilized: Environmental consultant - 2 wheeled Level of assistance: SBA Gait pattern: step through pattern, decreased stride length, decreased hip/knee flexion- Right, decreased hip/knee flexion- Left, decreased ankle dorsiflexion- Right, decreased ankle dorsiflexion- Left, trunk flexed, poor foot clearance- Right, and poor foot clearance- Left Comments: Cues necessary for improved upright posture and rolling walker proximity   TODAY'S TREATMENT:   08/08/23  THERAPEUTIC EXERCISE: to improve flexibility, strength and mobility.  Demonstration, verbal and tactile cues throughout for technique. NuStep - L6 x 9 min  THERAPEUTIC ACTIVITIES:  LE MMT DGI: 14/24  GAIT TRAINING: To normalize gait pattern and improve safety with RW . 400 ft with RW - better upright posture and improved RW proximity; better awareness of hip & knee flexion and heel strike on weight acceptance but occasional decreased L hip/knee flexion and ankle DF resulting in decreased foot clearance/foot scuff rather than good heel strike with fatigue  SELF CARE:  Verbal HEP review and recommendation for continued home performance. Recommendation for continued activity including potential use of [employer] program for gym member ship if offered by insurance provider. Recommendation to request assist of family or hire assistance for tasks beyond his capability to reduce risk for falls or future injury.   07/30/23  THERAPEUTIC EXERCISE: to improve flexibility,  strength and mobility.  Demonstration, verbal and tactile cues throughout for technique. NuStep - L6 x 6 min  BATCA knee flexion 25# 2 x 10 BATCA knee extension 25# 2 x 10  THERAPEUTIC ACTIVITIES: Sharlene Motts = 41/56  GAIT TRAINING: To normalize gait pattern and improve safety with collapsible RW . 220' with collapsible RW - continued cues for upright posture and RW proximity Stairs: Level of Assistance: SBA Stair Negotiation Technique: Step to Pattern Alternating Pattern  With use of AD: SPC  with Single Rail on Left Number of Stairs: 14  Height of Stairs: 7"  Comments: mixed alternating and step-to patter on ascent with use of SPC on R, step-to on descent w/o need for [facility]   07/26/23  THERAPEUTIC EXERCISE: to improve flexibility, strength and mobility.  Demonstration, verbal and tactile cues throughout for technique. NuStep - L6 x 6 min Seated row 25# 2x10 low grips Seated bicep curls 5lb 2 x 10 BUE Standing calf raises 2x10 - 3 sec hold at top Knee extension 20# x 20 reps BLE  GAIT TRAINING: To normalize gait pattern and improve safety with RW. 400' with collapsible RW - better L foot clearance but continued cues for upright posture and RW proximity   07/24/23  THERAPEUTIC EXERCISE: to improve flexibility, strength and mobility.  Demonstration, verbal and tactile cues throughout for technique. NuStep - L6 x 6 min Knee flexion 20# 2x12 BLE; 10# x 12 R/L Knee extension 15# 2x12BLE; 5# x 12 R/L Calf raises 2x10 - 3 sec hold Seated row 25# 2x10 Seated bicep curls 5# 2x10    PATIENT EDUCATION:  Education details: progress with PT, ongoing PT POC, and gait safety with RW   Person educated: Patient Education method: Explanation, Demonstration, and Verbal cues Education comprehension: verbalized understanding, verbal cues required, and needs further education  HOME EXERCISE PROGRAM: Access Code: [email or web address] URL: [email or web address] Date: 06/28/2023 Prepared by:  Glenetta Hew  Exercises - Seated Hamstring Stretch with Strap  - 1 x daily - 7 x weekly - 3 sets - 30 sec hold - Seated Quad Set  - 1 x daily - 7 x weekly - 3 sets - 10 reps - 5 seconds hold - Seated Hip Adduction Squeeze with Ball  - 1 x daily - 7 x weekly - 3 sets - 10 reps - 5 secs hold - Supine Quad Set on Towel Roll  - 1 x daily - 7 x weekly - 2 sets - 10 reps - 3 sec hold - Standing Gastroc Stretch at Counter  - 1 x daily - 7 x weekly - 3 sets - 3 reps - 30 sec hold - Seated Shoulder Horizontal Abduction with Resistance - Palms Down  - 1 x daily - 7 x weekly - 3 sets - 10 reps - Seated Anti-Rotation Press With Anchored Resistance  - 1 x daily - 7 x weekly - 3 sets - 10 reps - Standing Hip Abduction with Counter Support  - 1 x daily - 3 x weekly - 2 sets - 10 reps - Standing Hip Extension with Counter Support  - 1 x daily - 3 x weekly - 2 sets - 10 reps - Standing Knee Flexion with Counter Support  - 1 x daily - 3 x weekly - 2 sets - 10 reps - Seated Long Arc Quad  - 1 x daily - 3 x weekly - 2 sets - 10 reps - Seated March  - 1 x daily - 3 x weekly - 2 sets - 10 reps - Seated Hip Abduction with Resistance  - 1 x daily - 3 x weekly -  2 sets - 10 reps - 3-5 sec hold - Squat with Chair and Counter Support  - 1 x daily - 3 x weekly - 2 sets - 10 reps - 3 sec hold   ASSESSMENT:  CLINICAL IMPRESSION: Strider "[name]" has demonstrated good progress with PT but appears to have reached a plateau with gains achievable with PT.  He is aware of proper posture and gait pattern with RW to reduce risk for falls but still requires intermittent reminders during therapy sessions.  We have gone over safety concerns and strategies to reduce fall risk, reemphasizing the need to ask for help with activities beyond his current functional level.  Majority of PT goals now met or partially met, with only exception being DGI goal not met.  [name] is ready to transition to his HEP, therefore will proceed with discharge  from physical therapy for this episode.   OBJECTIVE IMPAIRMENTS: Abnormal gait, decreased activity tolerance, decreased balance, decreased coordination, decreased endurance, decreased knowledge of condition, decreased knowledge of use of DME, decreased mobility, difficulty walking, decreased ROM, decreased strength, decreased safety awareness, increased fascial restrictions, impaired perceived functional ability, increased muscle spasms, impaired flexibility, impaired sensation, improper body mechanics, postural dysfunction, and pain.   ACTIVITY LIMITATIONS: carrying, lifting, bending, sitting, standing, squatting, sleeping, stairs, transfers, bed mobility, bathing, locomotion level, and caring for others  PARTICIPATION LIMITATIONS: meal prep, cleaning, laundry, driving, shopping, and community activity  PERSONAL FACTORS: Age, Behavior pattern, Fitness, Past/current experiences, Time since onset of injury/illness/exacerbation, and 3+ comorbidities: L2-3, L3-4 Sublaminar Decompression on 12/27/22; Revision of femoral component of L THA on 07/02/22 s/p fall with periprosthetic fracture; Aortic atherosclerosis; carotid stenosis; 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   REHAB POTENTIAL: Good  CLINICAL DECISION MAKING: Evolving/moderate complexity  EVALUATION COMPLEXITY: Moderate   GOALS: Goals reviewed with patient? Yes  SHORT TERM GOALS: Target date: 04/10/2023, extended to 06/07/2023  Patient will be independent with initial HEP. Baseline:  Goal status: MET   2.  Complete balance testing and establish additional  STG's/LTG's as appropriate. Baseline:  Goal status: MET  03/22/23  3.  Patient will improve 5x STS time to </= 16 seconds with good eccentric control for improved efficiency and safety with transfers Baseline: 18.94 sec w/o UE assist (uncontrolled descent on most reps) Goal status: MET  05/10/23 - 13.31 sec w/o UE assist with good stability upon standing and improved eccentric control  4.  Patient will demonstrate decreased TUG time to </= 18 sec to decrease risk for falls with transitional mobility Baseline: 21.29 sec with RW; 04/17/23 - 20 sec with RW Goal status: MET 06/14/23 - 17.21 sec  LONG TERM GOALS: Target date: 05/08/2023, extended to 07/05/2023, extended to 08/09/2023  Patient will be independent with advanced/ongoing HEP to improve outcomes and carryover.  Baseline:  Goal status: MET  08/08/23   2.  Patient will report at least 50-75% improvement in B knee pain with no further instances of subluxation to improve QOL. Baseline: 5/10 Goal status: MET  05/10/23 - no recent knee pain reported  3.  Patient will demonstrate improved B knee extension AROM to [facility] to allow for improved stability for normal gait and stair mechanics. Baseline:  Refer to above LE ROM table Goal status: MET  06/28/23 - knee ROM has plateaued but adequate for normal mobility, gait and stair negotiation  4.  Patient will demonstrate improved B LE strength to >/= 4 to 4+/5 for improved stability and ease of mobility. Baseline: Refer to above LE MMT table Goal status: PARTIALLY MET  07/16/23 - proximal LE strength gradually improving  5.  Patient will be able to ambulate 400' with LRAD and normal gait pattern without increased pain to access community.  Baseline:  Goal status: PARTIALLY MET  08/08/23 - 400' with RW with better upright posture and improved RW proximity during assessment but cues still necessary at other times; occasional decreased L hip/knee flexion and ankle DF resulting in decreased foot  clearance/foot scuff rather than good heel strike  6. Patient will be able to ascend/descend stairs with 1 HR and reciprocal step pattern safely to access home and community.  Baseline:  Goal status: PARTIALLY MET  07/30/23 - able to complete intermittent reciprocal ascent with left HR with SPC with supervision/SBA of PT, step to pattern on descent with continued need for supervision/SBA of PT  7.  Patient will report >/= 40/80 on LEFS to demonstrate improved functional ability. Baseline: 31 / 80 = 38.8 %; 05/10/23 - 32 / 80 = 40.0 % Goal status: MET  06/28/23 -  40 / 80 = 50.0 %  8.  Patient will demonstrate at least 19/24 on DGI to decrease risk of falls. Baseline: 11/24 (03/22/23);  13/24 (05/10/23);  06/28/23 - 14/24 Goal status: NOT MET  08/08/23 - 14/24  9.  Patient will improve Berg score by at least 8 points to improve safety and stability with ADLs in standing and reduce risk for falls. Baseline: 35/56 (03/22/23);  32/56 (05/17/23);  38/56 (06/26/23) Goal status: PARTIALLY MET  07/30/23 - 41/56 (6 point improvement from eval, 9 point improvement from lowest score)   PLAN:  PT FREQUENCY: 2x/week  PT DURATION: 4-6 weeks  PLANNED INTERVENTIONS: 16109- PT Re-evaluation, 97110-Therapeutic exercises, 97530- Therapeutic activity, 97112- Neuromuscular re-education, 97535- Self Care, 60454- Manual therapy, 97116- Gait training, [phone]- Aquatic Therapy, 97014- Electrical stimulation (unattended), 97035- Ultrasound, Patient/Family education, Balance training, Stair training, Taping, Dry Needling, Joint mobilization, Spinal mobilization, DME instructions, Cryotherapy, Moist heat, Therapeutic exercises, Therapeutic activity, Neuromuscular re-education, Gait training, and Self Care  PLAN FOR NEXT SESSION: D/C from PT   PHYSICAL THERAPY DISCHARGE SUMMARY  Visits from Start of Care: 28  Current functional level related to goals / functional outcomes: Refer to above clinical impression and goal  assessment.   Remaining deficits: As above.    Education / Equipment: HEP, fall safety/prevention, recommendations to utilize [employer] program if available from his insurance provider   Patient agrees to discharge. Majority of patient goals were met or partially met. Patient is being discharged due to maximized rehab potential.    Marry Guan, PT 08/08/2023, 12:54 PM

## 2023-08-20 DIAGNOSIS — H353211 Exudative age-related macular degeneration, right eye, with active choroidal neovascularization: Secondary | ICD-10-CM | POA: Diagnosis not present

## 2023-08-20 DIAGNOSIS — H35073 Retinal telangiectasis, bilateral: Secondary | ICD-10-CM | POA: Diagnosis not present

## 2023-08-20 DIAGNOSIS — H35353 Cystoid macular degeneration, bilateral: Secondary | ICD-10-CM | POA: Diagnosis not present

## 2023-08-23 ENCOUNTER — Telehealth: Payer: Self-pay | Admitting: Family

## 2023-08-23 NOTE — Telephone Encounter (Signed)
[facility] notified, as per last ov note, patient is not supposed to be on Losartan. He is on Valsartan, Hydralazine and Amlodipine for his blood pressure management. They will discontinued prescription.  Lvm for patient to call back about this information.

## 2023-08-23 NOTE — Telephone Encounter (Signed)
[name] Pratt with [facility] called to confirm which medication provider wants patient on as he is on conflicting medications. Pt was taking losartan 100 mg earlier in the year and patient is trying to get losartan refilled however he just picked valsartan 320 mg from his local pharmacy. [facility] wants to know which medication pt should be on. Please call them at [phone]

## 2023-08-23 NOTE — Telephone Encounter (Signed)
Patient notified

## 2023-08-23 NOTE — Telephone Encounter (Signed)
He reports he will like to continue his medications at [facility]

## 2023-08-24 ENCOUNTER — Ambulatory Visit: Payer: Medicare HMO | Admitting: Family

## 2023-08-27 ENCOUNTER — Other Ambulatory Visit: Payer: Self-pay | Admitting: Family

## 2023-08-29 DIAGNOSIS — C44722 Squamous cell carcinoma of skin of right lower limb, including hip: Secondary | ICD-10-CM | POA: Diagnosis not present

## 2023-09-03 ENCOUNTER — Ambulatory Visit: Payer: Self-pay | Admitting: Licensed Clinical Social Worker

## 2023-09-03 NOTE — Patient Instructions (Signed)
Visit Information  Thank you for taking time to visit with me today. Please don't hesitate to contact me if I can be of assistance to you.   Following are the goals we discussed today:   Goals Addressed             This Visit's Progress    Patient is recovering from back surgery, He uses a cane or a walker as needed to help him walk       Interventions  Spoke with Shirl Harris, client , via phone today about his needs and status Discussed program support with RN, LCSW, Pharmacist Discussed client ambulation previously. He uses a cane or a walker as needed to help him walk. He is recovering from back surgery Used Active Listening techniques Discussed medication procurement of client. Client said he had his prescribed medications Discussed transport needs. Client is still driving at present Discussed appetite of client. Client said he has good appetite No mood issues noted Discussed support from NP , [name].    Client has support from his spouse, Ishak Keast Encouraged client  or Keyen Ciallella to call LCSW as needed at [phone].for SW support for client Client was appreciative of call from LCSW today          Our next appointment is by telephone on 11/06/23 at 4:00 PM   Please call the care guide team at [phone] if you need to cancel or reschedule your appointment.   If you are experiencing a Mental Health or Behavioral Health Crisis or need someone to talk to, please go to [facility] [address] [place] [phone])   The patient verbalized understanding of instructions, educational materials, and care plan provided today and DECLINED offer to receive copy of patient instructions, educational materials, and care plan.   The patient has been provided with contact information for the care management team and has been advised to call with any health related questions or concerns.   Kelton Pillar.Subrina Vecchiarelli  MSW, LCSW Licensed Visual merchandiser [facility] Care Management [phone]

## 2023-09-03 NOTE — Patient Outreach (Signed)
  Care Coordination   Follow Up Visit Note   09/03/2023 Name: [name] Pratt MRN: [id number] DOB: 04-Sep-1941  [name] Pratt is a 82 y.o. year old male who sees [name], NP for primary care. I spoke with  [name] Pratt by phone today.  What matters to the patients health and wellness today? Patient is recovering from back surgery, He uses a cane or a walker as needed to help him walk    Goals Addressed             This Visit's Progress    Patient is recovering from back surgery, He uses a cane or a walker as needed to help him walk       Interventions  Spoke with [name] Pratt, client , via phone today about his needs and status Discussed program support with RN, LCSW, Pharmacist Discussed client ambulation previously. He uses a cane or a walker as needed to help him walk. He is recovering from back surgery Used Active Listening techniques Discussed medication procurement of client. Client said he had his prescribed medications Discussed transport needs. Client is still driving at present Discussed appetite of client. Client said he has good appetite No mood issues noted Discussed support from NP , [name].    Client has support from his spouse, [name] Pratt Encouraged client  or [name] Pratt to call LCSW as needed at [phone].for SW support for client Client was appreciative of call from LCSW today          SDOH assessments and interventions completed:  Yes  SDOH Interventions Today    Flowsheet Row Most Recent Value  SDOH Interventions   Physical Activity Interventions Other (Comments)  [may have decreased activity]  Stress Interventions Other (Comment)  [has stress in managing medical needs]        Care Coordination Interventions:  Yes, provided    Interventions Today    Flowsheet Row Most Recent Value  Chronic Disease   Chronic disease during today's visit Other  [spoke with client about client needs]   General Interventions   General Interventions Discussed/Reviewed General Interventions Discussed, Community Resources  Education Interventions   Education Provided Provided Education  Provided Engineer, [employer]  Mental Health Interventions   Mental Health Discussed/Reviewed Coping Strategies  [no mood issues noted]  Nutrition Interventions   Nutrition Discussed/Reviewed Nutrition Discussed  Pharmacy Interventions   Pharmacy Dicussed/Reviewed Pharmacy Topics Discussed  Safety Interventions   Safety Discussed/Reviewed Fall Risk       Follow up plan: Follow up call scheduled for 11/06/23 at 4:00 PM     Encounter Outcome:  Patient Visit Completed   Kelton Pillar.Katyra Tomassetti MSW, LCSW Licensed Visual merchandiser [facility] Care Management [phone]

## 2023-09-13 ENCOUNTER — Other Ambulatory Visit: Payer: Self-pay

## 2023-09-13 ENCOUNTER — Ambulatory Visit: Payer: Self-pay | Admitting: Family

## 2023-09-13 ENCOUNTER — Encounter (HOSPITAL_BASED_OUTPATIENT_CLINIC_OR_DEPARTMENT_OTHER): Payer: Self-pay | Admitting: Emergency Medicine

## 2023-09-13 ENCOUNTER — Emergency Department (HOSPITAL_BASED_OUTPATIENT_CLINIC_OR_DEPARTMENT_OTHER): Payer: Medicare HMO

## 2023-09-13 ENCOUNTER — Inpatient Hospital Stay (HOSPITAL_BASED_OUTPATIENT_CLINIC_OR_DEPARTMENT_OTHER)
Admission: EM | Admit: 2023-09-13 | Discharge: 2023-09-17 | DRG: 177 | Disposition: A | Discharge status: Home Health | Payer: Medicare HMO | Attending: Internal Medicine | Admitting: Internal Medicine

## 2023-09-13 DIAGNOSIS — K5900 Constipation, unspecified: Secondary | ICD-10-CM | POA: Diagnosis not present

## 2023-09-13 DIAGNOSIS — M48061 Spinal stenosis, lumbar region without neurogenic claudication: Secondary | ICD-10-CM | POA: Diagnosis not present

## 2023-09-13 DIAGNOSIS — J690 Pneumonitis due to inhalation of food and vomit: Secondary | ICD-10-CM | POA: Diagnosis not present

## 2023-09-13 DIAGNOSIS — Z8616 Personal history of COVID-19: Secondary | ICD-10-CM | POA: Diagnosis not present

## 2023-09-13 DIAGNOSIS — R351 Nocturia: Secondary | ICD-10-CM | POA: Diagnosis present

## 2023-09-13 DIAGNOSIS — G8929 Other chronic pain: Secondary | ICD-10-CM | POA: Diagnosis present

## 2023-09-13 DIAGNOSIS — K449 Diaphragmatic hernia without obstruction or gangrene: Secondary | ICD-10-CM | POA: Diagnosis present

## 2023-09-13 DIAGNOSIS — J309 Allergic rhinitis, unspecified: Secondary | ICD-10-CM | POA: Diagnosis present

## 2023-09-13 DIAGNOSIS — L719 Rosacea, unspecified: Secondary | ICD-10-CM | POA: Diagnosis present

## 2023-09-13 DIAGNOSIS — R0789 Other chest pain: Secondary | ICD-10-CM | POA: Diagnosis not present

## 2023-09-13 DIAGNOSIS — Z9049 Acquired absence of other specified parts of digestive tract: Secondary | ICD-10-CM

## 2023-09-13 DIAGNOSIS — J9601 Acute respiratory failure with hypoxia: Secondary | ICD-10-CM | POA: Diagnosis not present

## 2023-09-13 DIAGNOSIS — R001 Bradycardia, unspecified: Secondary | ICD-10-CM | POA: Diagnosis not present

## 2023-09-13 DIAGNOSIS — I2510 Atherosclerotic heart disease of native coronary artery without angina pectoris: Secondary | ICD-10-CM | POA: Diagnosis not present

## 2023-09-13 DIAGNOSIS — Z85828 Personal history of other malignant neoplasm of skin: Secondary | ICD-10-CM

## 2023-09-13 DIAGNOSIS — I700 Atherosclerosis of aorta: Secondary | ICD-10-CM | POA: Diagnosis not present

## 2023-09-13 DIAGNOSIS — E039 Hypothyroidism, unspecified: Secondary | ICD-10-CM | POA: Diagnosis present

## 2023-09-13 DIAGNOSIS — Z79890 Hormone replacement therapy (postmenopausal): Secondary | ICD-10-CM

## 2023-09-13 DIAGNOSIS — R3589 Other polyuria: Secondary | ICD-10-CM | POA: Diagnosis present

## 2023-09-13 DIAGNOSIS — K5730 Diverticulosis of large intestine without perforation or abscess without bleeding: Secondary | ICD-10-CM | POA: Diagnosis not present

## 2023-09-13 DIAGNOSIS — R0902 Hypoxemia: Secondary | ICD-10-CM | POA: Diagnosis not present

## 2023-09-13 DIAGNOSIS — G2581 Restless legs syndrome: Secondary | ICD-10-CM | POA: Diagnosis present

## 2023-09-13 DIAGNOSIS — K429 Umbilical hernia without obstruction or gangrene: Secondary | ICD-10-CM | POA: Diagnosis not present

## 2023-09-13 DIAGNOSIS — Z96643 Presence of artificial hip joint, bilateral: Secondary | ICD-10-CM | POA: Diagnosis present

## 2023-09-13 DIAGNOSIS — Z8249 Family history of ischemic heart disease and other diseases of the circulatory system: Secondary | ICD-10-CM

## 2023-09-13 DIAGNOSIS — M5104 Intervertebral disc disorders with myelopathy, thoracic region: Secondary | ICD-10-CM | POA: Diagnosis not present

## 2023-09-13 DIAGNOSIS — R1031 Right lower quadrant pain: Secondary | ICD-10-CM | POA: Diagnosis not present

## 2023-09-13 DIAGNOSIS — H905 Unspecified sensorineural hearing loss: Secondary | ICD-10-CM | POA: Diagnosis present

## 2023-09-13 DIAGNOSIS — I129 Hypertensive chronic kidney disease with stage 1 through stage 4 chronic kidney disease, or unspecified chronic kidney disease: Secondary | ICD-10-CM | POA: Diagnosis present

## 2023-09-13 DIAGNOSIS — N200 Calculus of kidney: Secondary | ICD-10-CM | POA: Diagnosis not present

## 2023-09-13 DIAGNOSIS — M4807 Spinal stenosis, lumbosacral region: Secondary | ICD-10-CM | POA: Diagnosis not present

## 2023-09-13 DIAGNOSIS — K219 Gastro-esophageal reflux disease without esophagitis: Secondary | ICD-10-CM | POA: Diagnosis present

## 2023-09-13 DIAGNOSIS — N1831 Chronic kidney disease, stage 3a: Secondary | ICD-10-CM | POA: Diagnosis present

## 2023-09-13 DIAGNOSIS — E869 Volume depletion, unspecified: Secondary | ICD-10-CM | POA: Diagnosis not present

## 2023-09-13 DIAGNOSIS — Z87442 Personal history of urinary calculi: Secondary | ICD-10-CM

## 2023-09-13 DIAGNOSIS — J189 Pneumonia, unspecified organism: Secondary | ICD-10-CM | POA: Diagnosis not present

## 2023-09-13 DIAGNOSIS — R1030 Lower abdominal pain, unspecified: Secondary | ICD-10-CM

## 2023-09-13 DIAGNOSIS — Z981 Arthrodesis status: Secondary | ICD-10-CM

## 2023-09-13 DIAGNOSIS — E785 Hyperlipidemia, unspecified: Secondary | ICD-10-CM | POA: Diagnosis present

## 2023-09-13 DIAGNOSIS — H04123 Dry eye syndrome of bilateral lacrimal glands: Secondary | ICD-10-CM

## 2023-09-13 DIAGNOSIS — R682 Dry mouth, unspecified: Secondary | ICD-10-CM | POA: Diagnosis present

## 2023-09-13 DIAGNOSIS — Z96653 Presence of artificial knee joint, bilateral: Secondary | ICD-10-CM | POA: Diagnosis present

## 2023-09-13 DIAGNOSIS — R252 Cramp and spasm: Secondary | ICD-10-CM | POA: Diagnosis present

## 2023-09-13 DIAGNOSIS — J439 Emphysema, unspecified: Secondary | ICD-10-CM | POA: Diagnosis present

## 2023-09-13 DIAGNOSIS — Z885 Allergy status to narcotic agent status: Secondary | ICD-10-CM

## 2023-09-13 DIAGNOSIS — Z9981 Dependence on supplemental oxygen: Secondary | ICD-10-CM

## 2023-09-13 DIAGNOSIS — Z79899 Other long term (current) drug therapy: Secondary | ICD-10-CM

## 2023-09-13 DIAGNOSIS — Z888 Allergy status to other drugs, medicaments and biological substances status: Secondary | ICD-10-CM

## 2023-09-13 DIAGNOSIS — J9811 Atelectasis: Secondary | ICD-10-CM | POA: Diagnosis not present

## 2023-09-13 DIAGNOSIS — D539 Nutritional anemia, unspecified: Secondary | ICD-10-CM | POA: Diagnosis present

## 2023-09-13 DIAGNOSIS — M4804 Spinal stenosis, thoracic region: Secondary | ICD-10-CM | POA: Diagnosis not present

## 2023-09-13 DIAGNOSIS — Z7982 Long term (current) use of aspirin: Secondary | ICD-10-CM

## 2023-09-13 DIAGNOSIS — I10 Essential (primary) hypertension: Secondary | ICD-10-CM | POA: Diagnosis not present

## 2023-09-13 DIAGNOSIS — Z87891 Personal history of nicotine dependence: Secondary | ICD-10-CM

## 2023-09-13 DIAGNOSIS — R0989 Other specified symptoms and signs involving the circulatory and respiratory systems: Secondary | ICD-10-CM | POA: Diagnosis not present

## 2023-09-13 DIAGNOSIS — M109 Gout, unspecified: Secondary | ICD-10-CM | POA: Diagnosis present

## 2023-09-13 LAB — CBC
HCT: 39.3 % (ref 39.0–52.0)
Hemoglobin: 12.9 g/dL — ABNORMAL LOW (ref 13.0–17.0)
MCH: 32.5 pg (ref 26.0–34.0)
MCHC: 32.8 g/dL (ref 30.0–36.0)
MCV: 99 fL (ref 80.0–100.0)
Platelets: 351 10*3/uL (ref 150–400)
RBC: 3.97 MIL/uL — ABNORMAL LOW (ref 4.22–5.81)
RDW: 13.1 % (ref 11.5–15.5)
WBC: 13.8 10*3/uL — ABNORMAL HIGH (ref 4.0–10.5)
nRBC: 0 % (ref 0.0–0.2)

## 2023-09-13 LAB — COMPREHENSIVE METABOLIC PANEL
ALT: 22 U/L (ref 0–44)
AST: 30 U/L (ref 15–41)
Albumin: 4.4 g/dL (ref 3.5–5.0)
Alkaline Phosphatase: 76 U/L (ref 38–126)
Anion gap: 9 (ref 5–15)
BUN: 29 mg/dL — ABNORMAL HIGH (ref 8–23)
CO2: 27 mmol/L (ref 22–32)
Calcium: 10 mg/dL (ref 8.9–10.3)
Chloride: 103 mmol/L (ref 98–111)
Creatinine, Ser: 1.29 mg/dL — ABNORMAL HIGH (ref 0.61–1.24)
GFR, Estimated: 55 mL/min — ABNORMAL LOW (ref >60)
Glucose, Bld: 132 mg/dL — ABNORMAL HIGH (ref 70–99)
Potassium: 3.6 mmol/L (ref 3.5–5.1)
Sodium: 139 mmol/L (ref 135–145)
Total Bilirubin: 0.5 mg/dL (ref 0.0–1.2)
Total Protein: 8.2 g/dL — ABNORMAL HIGH (ref 6.5–8.1)

## 2023-09-13 LAB — LIPASE, BLOOD: Lipase: 30 U/L (ref 11–51)

## 2023-09-13 MED ORDER — ONDANSETRON HCL 4 MG/2ML IJ SOLN
4.0000 mg | Freq: Once | INTRAMUSCULAR | Status: AC | PRN
  Administered 2023-09-13: 4 mg via INTRAVENOUS
  Filled 2023-09-13: qty 2

## 2023-09-13 MED ORDER — ONDANSETRON HCL 4 MG/2ML IJ SOLN
4.0000 mg | Freq: Once | INTRAMUSCULAR | Status: AC
  Administered 2023-09-13: 4 mg via INTRAVENOUS
  Filled 2023-09-13: qty 2

## 2023-09-13 MED ORDER — SODIUM CHLORIDE 0.9 % IV BOLUS
1000.0000 mL | Freq: Once | INTRAVENOUS | Status: AC
  Administered 2023-09-13: 1000 mL via INTRAVENOUS

## 2023-09-13 NOTE — ED Triage Notes (Signed)
 Pt reports N/V since this morning. Pt also reports abd pain in the middle of his belly.

## 2023-09-13 NOTE — ED Provider Notes (Signed)
 [place] [facility] AT [facility] Provider Note   CSN: [id number] Arrival date & time: 09/13/23  1941     History  Chief Complaint  Patient presents with   Abdominal Pain   Emesis    [name] Pratt is a 83 y.o. male.  Patient with a history of GERD, carotid stenosis, COPD, hypertension, diverticulosis, presents with lower abdominal pain since approximately noon today.  Pain comes and goes lasting for several hours at a time but never goes away completely.  Most of his pain is periumbilical and right lower quadrant in nature.  Associate with nausea and dry heaving.  Last bowel movement was this morning which she describes as hard with hard balls.  Does not think he is constipated however.  Still passing gas.  No pain with urination or blood in the urine.  No chest pain or shortness of breath.  No fever.  He has never had this pain in the past.  History of kidney stones.  History of cholecystectomy.  Still has appendix.  Feels pain is improved substantially since receiving medication in triage.  Most of his pain is near his umbilicus.  The history is provided by the patient.  Abdominal Pain Associated symptoms: nausea and vomiting   Associated symptoms: no cough, no dysuria, no fever, no hematuria and no shortness of breath   Emesis Associated symptoms: abdominal pain   Associated symptoms: no arthralgias, no cough, no fever, no headaches and no myalgias        Home Medications Prior to Admission medications   Medication Sig Start Date End Date Taking? Authorizing Provider  acetaminophen  (TYLENOL ) 500 MG tablet Take 500 mg by mouth 2 (two) times daily.    [provider]  albuterol  (VENTOLIN  HFA) 108 (90 Base) MCG/ACT inhaler INHALE 2 PUFFS INTO THE LUNGS EVERY 6 HOURS AS NEEDED FOR SHORTNESS OF BREATH. Patient taking differently: Inhale 2 puffs into the lungs every 6 (six) hours as needed for shortness of breath. 10/20/21   O'Sullivan, Melissa, NP   allopurinol  (ZYLOPRIM ) 100 MG tablet Take 1 tablet (100 mg total) by mouth 2 (two) times daily. 07/17/23   O'Sullivan, Melissa, NP  amLODipine  (NORVASC ) 2.5 MG tablet Take 1 tablet (2.5 mg total) by mouth 2 (two) times daily. HOLD AMLODIPINE  DISE IF BLOOD PRESSURE LESS THAN 110/50 07/17/23   Daryl Setter, NP  antiseptic oral rinse (BIOTENE) LIQD 15 mLs by Mouth Rinse route 2 (two) times daily.    [provider]  aspirin  EC 81 MG tablet Take 81 mg by mouth at bedtime.    [provider]  bacitracin  ointment Apply 1 Application topically 2 (two) times daily. Patient taking differently: Apply 1 Application topically daily as needed for wound care. 09/15/22   [name], [name], DO  cholestyramine  (QUESTRAN ) 4 GM/DOSE powder Take by mouth daily. 30 ml    [provider]  cycloSPORINE  (RESTASIS ) 0.05 % ophthalmic emulsion Place 1 drop into both eyes 2 (two) times daily.    [provider]  docusate sodium  (COLACE) 100 MG capsule Take 1 capsule (100 mg total) by mouth every 12 (twelve) hours. 12/30/22   Geiple, Joshua, PA-C  fluticasone  (FLONASE ) 50 MCG/ACT nasal spray Place 1 spray into both nostrils daily as needed for allergies.    [provider]  gabapentin  (NEURONTIN ) 300 MG capsule Take 1 capsule (300 mg total) by mouth at bedtime. 07/17/23   Daryl Setter, NP  GEMTESA 75 MG TABS Take 75 mg by mouth  daily. 10/20/21   [provider]  glucose blood test strip TRUE Metrix Blood Glucose Test Strip, use as instructed 11/12/20   O'Sullivan, Melissa, NP  hydrALAZINE  (APRESOLINE ) 50 MG tablet Take 1 tablet (50 mg total) by mouth 3 (three) times daily. 07/17/23   Daryl Setter, NP  Iron , Ferrous Sulfate , 325 (65 Fe) MG TABS Take 325 mg by mouth every other day. Patient taking differently: Take 325 mg by mouth daily. 08/10/22   O'Sullivan, Melissa, NP  Lancets MISC 1 each by Does not apply route 2 (two) times daily. TRUE matrix lancets 11/12/20    Daryl Setter, NP  levothyroxine  (SYNTHROID ) 150 MCG tablet TAKE 1 TABLET ON 6 DAYS PER WEEK (TAKE 75 MCG ONE TIME PER WEEK) 06/14/23   Daryl Setter, NP  levothyroxine  (SYNTHROID ) 75 MCG tablet Take 75 mcg once a week (patient on 150 mcg 6 days a week) Dispense #24 for 90 day supply. 07/17/23   O'Sullivan, Melissa, NP  loratadine  (CLARITIN ) 10 MG tablet Take 10 mg by mouth daily as needed for allergies or rhinitis.    [provider]  Magnesium  500 MG TABS Take 500 mg by mouth daily.    [provider]  Menthol , Topical Analgesic, (ICY HOT EX) Apply 1 Application topically at bedtime. Lidocaine  / on back    [provider]  metroNIDAZOLE  (METROCREAM ) 0.75 % cream Apply 1 Application topically daily as needed (head).    [provider]  Multiple Vitamins-Minerals (MULTIVITAMIN WITH MINERALS) tablet Take 1 tablet by mouth daily.    [provider]  multivitamin-lutein  (OCUVITE-LUTEIN ) CAPS capsule Take 1 capsule by mouth daily.    [provider]  nitroGLYCERIN  (NITROSTAT ) 0.4 MG SL tablet Place 1 tablet (0.4 mg total) under the tongue every 5 (five) minutes as needed for chest pain. 09/16/21   Nishan, Peter C, MD  OVER THE COUNTER MEDICATION Place 1 spray into both nostrils 2 (two) times daily. Sinus saline    [provider]  OVER THE COUNTER MEDICATION Take 15 mLs by mouth at bedtime. Yellow Mustard    [provider]  polyethylene glycol powder (GLYCOLAX /MIRALAX ) 17 GM/SCOOP powder Take 17 g by mouth 2 (two) times daily as needed for mild constipation, moderate constipation or severe constipation. 12/30/22   Geiple, Joshua, PA-C  polyvinyl alcohol  (ARTIFICIAL TEARS) 1.4 % ophthalmic solution Place 1 drop into both eyes daily as needed for dry eyes.    [provider]  PREBIOTIC PRODUCT PO Take 1 tablet by mouth daily.    [provider]  PRESCRIPTION MEDICATION CPAP- At bedtime and during any time of  rest    [provider]  Probiotic Product (PROBIOTIC DAILY PO) Take 1 capsule by mouth daily as needed (Constipation).    [provider]  rosuvastatin  (CRESTOR ) 5 MG tablet Take 1 tablet (5 mg total) by mouth daily. 07/17/23   O'Sullivan, Melissa, NP  valsartan  (DIOVAN ) 320 MG tablet Take 1 tablet by mouth once daily 08/27/23   O'Sullivan, Melissa, NP  vitamin B-12 (CYANOCOBALAMIN ) 500 MCG tablet Take 500 mcg by mouth in the morning.    [provider]      Allergies    Pravastatin , Sildenafil , Oxycodone , Atenolol, and Metoclopramide hcl    Review of Systems   Review of Systems  Constitutional:  Negative for activity change, appetite change and fever.  HENT:  Negative for congestion.   Respiratory:  Negative for cough, chest tightness and shortness of breath.   Gastrointestinal:  Positive  for abdominal pain, nausea and vomiting.  Genitourinary:  Negative for dysuria and hematuria.  Musculoskeletal:  Negative for arthralgias and myalgias.  Skin:  Negative for rash.  Neurological:  Negative for dizziness, weakness and headaches.   all other systems are negative except as noted in the HPI and PMH.    Physical Exam Updated Vital Signs BP (!) 187/83   Pulse 65   Temp 98.2 F (36.8 C) (Oral)   Resp 19   SpO2 97%  Physical Exam Vitals and nursing note reviewed.  Constitutional:      General: He is not in acute distress.    Appearance: He is well-developed.  HENT:     Head: Normocephalic and atraumatic.     Mouth/Throat:     Pharynx: No oropharyngeal exudate.  Eyes:     Conjunctiva/sclera: Conjunctivae normal.     Pupils: Pupils are equal, round, and reactive to light.  Neck:     Comments: No meningismus. Cardiovascular:     Rate and Rhythm: Normal rate and regular rhythm.     Heart sounds: Normal heart sounds. No murmur heard. Pulmonary:     Effort: Pulmonary effort is normal. No respiratory distress.     Breath sounds: Normal breath sounds.   Abdominal:     Palpations: Abdomen is soft.     Tenderness: There is abdominal tenderness. There is no guarding or rebound.     Comments: Periumbilical tenderness, no guarding or rebound.  Reducible ventral hernia. Equal femoral pulses bilaterally  Genitourinary:    Comments: No fecal impaction Musculoskeletal:        General: No tenderness. Normal range of motion.     Cervical back: Normal range of motion and neck supple.  Skin:    General: Skin is warm.  Neurological:     Mental Status: He is alert and oriented to person, place, and time.     Cranial Nerves: No cranial nerve deficit.     Motor: No abnormal muscle tone.     Coordination: Coordination normal.     Comments: No ataxia on finger to nose bilaterally. No pronator drift. 5/5 strength throughout. CN 2-12 intact.Equal grip strength. Sensation intact.   Psychiatric:        Behavior: Behavior normal.     ED Results / Procedures / Treatments   Labs (all labs ordered are listed, but only abnormal results are displayed) Labs Reviewed  COMPREHENSIVE METABOLIC PANEL - Abnormal; Notable for the following components:      Result Value   Glucose, Bld 132 (*)    BUN 29 (*)    Creatinine, Ser 1.29 (*)    Total Protein 8.2 (*)    GFR, Estimated 55 (*)    All other components within normal limits  CBC - Abnormal; Notable for the following components:   WBC 13.8 (*)    RBC 3.97 (*)    Hemoglobin 12.9 (*)    All other components within normal limits  URINALYSIS, ROUTINE W REFLEX MICROSCOPIC - Abnormal; Notable for the following components:   Protein, ur 100 (*)    All other components within normal limits  BRAIN NATRIURETIC PEPTIDE - Abnormal; Notable for the following components:   B Natriuretic Peptide 232.3 (*)    All other components within normal limits  TROPONIN I (HIGH SENSITIVITY) - Abnormal; Notable for the following components:   Troponin I (High Sensitivity) 37 (*)    All other components within normal limits   TROPONIN I (HIGH SENSITIVITY) - Abnormal; Notable  for the following components:   Troponin I (High Sensitivity) 38 (*)    All other components within normal limits  CULTURE, BLOOD (ROUTINE X 2)  CULTURE, BLOOD (ROUTINE X 2)  LIPASE, BLOOD  LACTIC ACID, PLASMA  LACTIC ACID, PLASMA  URINALYSIS, MICROSCOPIC (REFLEX)    EKG EKG Interpretation Date/Time:  Thursday September 13 2023 23:37:39 EST Ventricular Rate:  59 PR Interval:  72 QRS Duration:  157 QT Interval:  489 QTC Calculation: 485 R Axis:   -65  Text Interpretation: Sinus rhythm 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 Clinical concern for pulmonary embolism. Positive D-dimer. EXAM: CT ANGIOGRAPHY CHEST WITH CONTRAST TECHNIQUE: Multidetector CT imaging of the chest was performed using the standard protocol during bolus administration of intravenous contrast. Multiplanar CT image reconstructions and MIPs were obtained to evaluate the vascular anatomy. RADIATION DOSE REDUCTION: This exam was performed according to the departmental dose-optimization program which includes automated exposure control, adjustment of the mA and/or kV according to patient size and/or use of iterative reconstruction technique. CONTRAST:  75mL OMNIPAQUE  IOHEXOL  350 MG/ML SOLN COMPARISON:  Chest CT 08/30/2020. FINDINGS: Cardiovascular: No filling defects are noted in the pulmonary arterial tree to suggest pulmonary embolism. Heart size is borderline enlarged. There is no significant pericardial fluid, thickening or pericardial calcification. There is aortic atherosclerosis, as well as atherosclerosis of the great vessels of the mediastinum and the coronary  arteries, including calcified atherosclerotic plaque in the left main, left anterior descending, left circumflex and right coronary arteries. Calcifications of the aortic valve and mitral annulus. Mediastinum/Nodes: No pathologically enlarged mediastinal or hilar lymph nodes. Moderate-sized hiatal hernia. No axillary lymphadenopathy. Lungs/Pleura: Patchy areas of ground-glass attenuation, septal thickening, regional architectural distortion and volume loss are noted in the basal portions of the lower lobes of the lungs bilaterally. No confluent consolidative airspace disease. No pleural effusions. No definite suspicious appearing pulmonary nodules or masses are noted. Upper Abdomen: Aortic atherosclerosis.  Status post cholecystectomy. Musculoskeletal: There are no aggressive appearing lytic or blastic lesions noted in the visualized portions of the skeleton. Review of the MIP images confirms the above findings. IMPRESSION: 1. No evidence of pulmonary embolism. 2. Basal predominant infectious/inflammatory changes in the lower lobes of the lungs bilaterally. Clinical correlation for signs and symptoms of aspiration pneumonitis/pneumonia is recommended. 3. Aortic atherosclerosis, in addition to left main and three-vessel coronary artery disease. 4. There are calcifications of the aortic valve and mitral annulus. Echocardiographic correlation for evaluation of potential valvular dysfunction may be warranted if clinically indicated. Aortic Atherosclerosis (ICD10-I70.0). Electronically Signed   By: Toribio Aye M.D.   On: 09/14/2023 05:32   DG Chest Portable 1 View Result Date: 09/14/2023 CLINICAL DATA:  Hypoxia EXAM: PORTABLE CHEST 1 VIEW COMPARISON:  04/26/2023 FINDINGS: Cardiac shadow is stable. Mild left basilar atelectasis is noted. Mild central vascular congestion is noted without edema. No bony abnormality is seen. IMPRESSION: Left basilar atelectasis. Mild vascular congestion. Electronically Signed   By:  Oneil Devonshire M.D.   On: 09/14/2023 01:23   CT ABDOMEN PELVIS W CONTRAST Result Date: 09/14/2023 CLINICAL DATA:  Abdominal pain, acute, nonlocalized EXAM: CT ABDOMEN AND PELVIS WITH CONTRAST TECHNIQUE: Multidetector CT imaging of the abdomen and pelvis was  performed using the standard protocol following bolus administration of intravenous contrast. RADIATION DOSE REDUCTION: This exam was performed according to the departmental dose-optimization program which includes automated exposure control, adjustment of the mA and/or kV according to patient size and/or use of iterative reconstruction technique. CONTRAST:  OMNIPAQUE  IOHEXOL  300 MG/ML  SOLN COMPARISON:  CT abdomen pelvis 12/03/2022. FINDINGS: Lower chest: Small to moderate volume hiatal hernia. Mitral annular calcification. Hepatobiliary: Diffusely hypodense hepatic parenchyma compared to the spleen. No focal liver abnormality. Status post cholecystectomy. No biliary dilatation. Pancreas: No focal lesion. Normal pancreatic contour. No surrounding inflammatory changes. No main pancreatic ductal dilatation. Spleen: Normal in size without focal abnormality. Adrenals/Urinary Tract: No adrenal nodule bilaterally. Bilateral kidneys enhance symmetrically. 1 cm right nephrolithiasis. No left nephrolithiasis. No ureterolithiasis with limited evaluation of distal ureters due to streak artifact originating from the bilateral femoral hardware. No hydronephrosis. No hydroureter. The urinary bladder is unremarkable. Stomach/Bowel: Stomach is within normal limits. No evidence of bowel wall thickening or dilatation. Likely peristalsis of the distal sigmoid colon (301:58, 601:49). Stool throughout the colon. Colonic diverticulosis. Appendix appears normal. Vascular/Lymphatic: No abdominal aorta or iliac aneurysm. Mild atherosclerotic plaque of the aorta and its branches. No abdominal, pelvic, or inguinal lymphadenopathy. Reproductive: Prostate is unremarkable. Other: No  intraperitoneal free fluid. No intraperitoneal free gas. No organized fluid collection. Musculoskeletal: Tiny fat containing umbilical hernia with an abdominal defect of 0.5 cm. No suspicious lytic or blastic osseous lesions. No acute displaced fracture. Multilevel degenerative changes of the spine. Bilateral hip total hip arthroplasty. IMPRESSION: 1. Small to moderate volume hiatal hernia. 2. Stool throughout the colon-correlate for constipation. 3. Colonic diverticulosis with no acute diverticulitis. 4. Likely peristalsis of the distal sigmoid colon. Recommend follow up to exclude underlying bowel wall thickening. 5. Nonobstructive 1 cm right nephrolithiasis. 6. Hepatic steatosis. 7.  Aortic Atherosclerosis (ICD10-I70.0). 8. Status post cholecystectomy. Electronically Signed   By: Morgane  Naveau M.D.   On: 09/14/2023 00:46    Procedures Procedures    Medications Ordered in ED Medications  sodium chloride  0.9 % bolus 1,000 mL (has no administration in time range)  ondansetron  (ZOFRAN ) injection 4 mg (has no administration in time range)  ondansetron  (ZOFRAN ) injection 4 mg (4 mg Intravenous Given 09/13/23 2004)    ED Course/ Medical Decision Making/ A&P                                 Medical Decision Making Amount and/or Complexity of Data Reviewed Labs: ordered. Decision-making details documented in ED Course. Radiology: ordered and independent interpretation performed. Decision-making details documented in ED Course. ECG/medicine tests: ordered and independent interpretation performed. Decision-making details documented in ED Course.  Risk Prescription drug management. Decision regarding hospitalization.  Abdominal pain with nausea and vomiting since this morning.  History of cholecystectomy.  Vitals stable.  No distress.  Abdomen soft without peritoneal signs.  Lab showed leukocytosis of 13.8.  LFTs and lipase are normal.  Urinalysis negative   EKG nonischemic.  Troponin minimally  elevated but flat.  X-ray negative for pneumothorax but does show questionable atelectasis at the bases.  Patient borderline hypoxic to the mid 80s on room air.  CT abdomen pelvis is reassuring without surgical pathology.  Pain in his abdomen may be due to constipation.  Does have hiatal hernia as well as coronary calcifications aortic valve calcifications.  CT PE negative for pulmonary embolism but does show evidence of aspiration pneumonia.  Patient started on Zosyn  given his hypoxia on room air.  Suspect likely aspiration pneumonia secondary to his vomiting.  Abdominal pain likely secondary to constipation.  Will need admission given his new oxygen requirement.  Continue IV antibiotics after cultures are obtained.  Admission discussed with Dr. Franky.       Final Clinical Impression(s) / ED Diagnoses Final diagnoses:  Acute respiratory failure with hypoxia (HCC)  Lower abdominal pain  Aspiration pneumonia of both lower lobes, unspecified aspiration pneumonia type [facility])    Rx / DC Orders ED Discharge Orders     None         Carita Senior, MD 09/14/23 [phone]

## 2023-09-13 NOTE — Telephone Encounter (Signed)
 Appt scheduled tomorrow

## 2023-09-13 NOTE — Telephone Encounter (Signed)
 Copied from CRM [id number]. Topic: Clinical - Red Word Triage >> Sep 13, 2023  3:20 PM Rosina BIRCH wrote: Red Word that prompted transfer to Nurse Triage: very little bowel movements, acid indigestion, throwing up, stomach pain when he stands up    Chief Complaint: abd pain Symptoms: constipation and vomiting Frequency: intermittent Pertinent Negatives: Patient denies fever Disposition: [] ED /[] Urgent Care (no appt availability in office) / [x] Appointment(In office/virtual)/ []  [facility]/ [] Home Care/ [] Refused Recommended Disposition /[] [facility]/ []  Follow-up with PCP Additional Notes: Patient reports abdminal pain after eating breakfast this morning, states that he's gone to the bathroom a few times today and has small drops of stool. Pt states that he also has been vomiting a few times today as well. Unsure of cause, states that pain is worse when he moves around, but better when he is sitting down.     Reason for Disposition  [1] MODERATE pain (e.g., interferes with normal activities) AND [2] pain comes and goes (cramps) AND [3] present > 24 hours  (Exception: Pain with Vomiting or Diarrhea - see that Guideline.)  Answer Assessment - Initial Assessment Questions 1. LOCATION: Where does it hurt?      Across the bottom of stomach below belly button  2. RADIATION: Does the pain shoot anywhere else? (e.g., chest, back)     No radiating pain  3. ONSET: When did the pain begin? (Minutes, hours or days ago)      Several hours, since this morning  4. SUDDEN: Gradual or sudden onset?     Gradual  5. PATTERN Does the pain come and go, or is it constant?    - If it comes and goes: How long does it last? Do you have pain now?     (Note: Comes and goes means the pain is intermittent. It goes away completely between bouts.)    - If constant: Is it getting better, staying the same, or getting worse?      (Note: Constant means the pain never goes away  completely; most serious pain is constant and gets worse.)      Comes and goes, unsure of how long it last  6. SEVERITY: How bad is the pain?  (e.g., Scale 1-10; mild, moderate, or severe)    - MILD (1-3): Doesn't interfere with normal activities, abdomen soft and not tender to touch.     - MODERATE (4-7): Interferes with normal activities or awakens from sleep, abdomen tender to touch.     - SEVERE (8-10): Excruciating pain, doubled over, unable to do any normal activities.       No pain at this time while sitting down  7. RECURRENT SYMPTOM: Have you ever had this type of stomach pain before? If Yes, ask: When was the last time? and What happened that time?      Yes, had a hernia before  8. CAUSE: What do you think is causing the stomach pain?     Unsure of cause   9. RELIEVING/AGGRAVATING FACTORS: What makes it better or worse? (e.g., antacids, bending or twisting motion, bowel movement)     Antacids helps right now, pain worse when moving around  10. OTHER SYMPTOMS: Do you have any other symptoms? (e.g., back pain, diarrhea, fever, urination pain, vomiting)       Little bowel movement, vomiting, and indigestion  Protocols used: Abdominal Pain - Male-A-AH

## 2023-09-14 ENCOUNTER — Emergency Department (HOSPITAL_BASED_OUTPATIENT_CLINIC_OR_DEPARTMENT_OTHER): Payer: Medicare HMO

## 2023-09-14 ENCOUNTER — Observation Stay (HOSPITAL_COMMUNITY): Payer: Medicare HMO

## 2023-09-14 ENCOUNTER — Ambulatory Visit: Payer: Medicare HMO | Admitting: Nurse Practitioner

## 2023-09-14 DIAGNOSIS — K5730 Diverticulosis of large intestine without perforation or abscess without bleeding: Secondary | ICD-10-CM | POA: Diagnosis not present

## 2023-09-14 DIAGNOSIS — K449 Diaphragmatic hernia without obstruction or gangrene: Secondary | ICD-10-CM | POA: Diagnosis not present

## 2023-09-14 DIAGNOSIS — M5104 Intervertebral disc disorders with myelopathy, thoracic region: Secondary | ICD-10-CM | POA: Diagnosis not present

## 2023-09-14 DIAGNOSIS — K429 Umbilical hernia without obstruction or gangrene: Secondary | ICD-10-CM | POA: Diagnosis not present

## 2023-09-14 DIAGNOSIS — N200 Calculus of kidney: Secondary | ICD-10-CM | POA: Diagnosis not present

## 2023-09-14 DIAGNOSIS — M4804 Spinal stenosis, thoracic region: Secondary | ICD-10-CM | POA: Diagnosis not present

## 2023-09-14 DIAGNOSIS — R001 Bradycardia, unspecified: Secondary | ICD-10-CM

## 2023-09-14 DIAGNOSIS — J9601 Acute respiratory failure with hypoxia: Secondary | ICD-10-CM

## 2023-09-14 DIAGNOSIS — R0902 Hypoxemia: Secondary | ICD-10-CM | POA: Diagnosis not present

## 2023-09-14 DIAGNOSIS — H04123 Dry eye syndrome of bilateral lacrimal glands: Secondary | ICD-10-CM

## 2023-09-14 DIAGNOSIS — R0989 Other specified symptoms and signs involving the circulatory and respiratory systems: Secondary | ICD-10-CM | POA: Diagnosis not present

## 2023-09-14 DIAGNOSIS — J9811 Atelectasis: Secondary | ICD-10-CM | POA: Diagnosis not present

## 2023-09-14 DIAGNOSIS — M4807 Spinal stenosis, lumbosacral region: Secondary | ICD-10-CM | POA: Diagnosis not present

## 2023-09-14 DIAGNOSIS — M48061 Spinal stenosis, lumbar region without neurogenic claudication: Secondary | ICD-10-CM | POA: Diagnosis not present

## 2023-09-14 HISTORY — DX: Dry eye syndrome of bilateral lacrimal glands: H04.123

## 2023-09-14 HISTORY — DX: Bradycardia, unspecified: R00.1

## 2023-09-14 HISTORY — DX: Acute respiratory failure with hypoxia: J96.01

## 2023-09-14 LAB — CBC
HCT: 35 % — ABNORMAL LOW (ref 39.0–52.0)
Hemoglobin: 11.2 g/dL — ABNORMAL LOW (ref 13.0–17.0)
MCH: 32.8 pg (ref 26.0–34.0)
MCHC: 32 g/dL (ref 30.0–36.0)
MCV: 102.6 fL — ABNORMAL HIGH (ref 80.0–100.0)
Platelets: 326 10*3/uL (ref 150–400)
RBC: 3.41 MIL/uL — ABNORMAL LOW (ref 4.22–5.81)
RDW: 13.2 % (ref 11.5–15.5)
WBC: 10.8 10*3/uL — ABNORMAL HIGH (ref 4.0–10.5)
nRBC: 0 % (ref 0.0–0.2)

## 2023-09-14 LAB — CREATININE, SERUM
Creatinine, Ser: 1.14 mg/dL (ref 0.61–1.24)
GFR, Estimated: >60 mL/min (ref >60)

## 2023-09-14 LAB — URINALYSIS, ROUTINE W REFLEX MICROSCOPIC
Bilirubin Urine: NEGATIVE
Glucose, UA: NEGATIVE mg/dL
Hgb urine dipstick: NEGATIVE
Ketones, ur: NEGATIVE mg/dL
Leukocytes,Ua: NEGATIVE
Nitrite: NEGATIVE
Protein, ur: 100 mg/dL — AB
Specific Gravity, Urine: 1.025 (ref 1.005–1.030)
pH: 6.5 (ref 5.0–8.0)

## 2023-09-14 LAB — LACTIC ACID, PLASMA
Lactic Acid, Venous: 0.9 mmol/L (ref 0.5–1.9)
Lactic Acid, Venous: 1 mmol/L (ref 0.5–1.9)

## 2023-09-14 LAB — TROPONIN I (HIGH SENSITIVITY)
Troponin I (High Sensitivity): 37 ng/L — ABNORMAL HIGH (ref <18)
Troponin I (High Sensitivity): 38 ng/L — ABNORMAL HIGH (ref <18)

## 2023-09-14 LAB — BRAIN NATRIURETIC PEPTIDE: B Natriuretic Peptide: 232.3 pg/mL — ABNORMAL HIGH (ref 0.0–100.0)

## 2023-09-14 LAB — URINALYSIS, MICROSCOPIC (REFLEX)
Bacteria, UA: NONE SEEN
WBC, UA: NONE SEEN WBC/hpf (ref 0–5)

## 2023-09-14 MED ORDER — SODIUM CHLORIDE 0.9 % IV SOLN
500.0000 mg | INTRAVENOUS | Status: AC
  Administered 2023-09-14 – 2023-09-16 (×3): 500 mg via INTRAVENOUS
  Filled 2023-09-14 (×3): qty 5

## 2023-09-14 MED ORDER — HYDRALAZINE HCL 50 MG PO TABS
50.0000 mg | ORAL_TABLET | Freq: Three times a day (TID) | ORAL | Status: DC
  Administered 2023-09-14 – 2023-09-17 (×9): 50 mg via ORAL
  Filled 2023-09-14 (×9): qty 1

## 2023-09-14 MED ORDER — HYDROCODONE-ACETAMINOPHEN 5-325 MG PO TABS
1.0000 | ORAL_TABLET | Freq: Once | ORAL | Status: AC
  Administered 2023-09-14: 1 via ORAL
  Filled 2023-09-14: qty 1

## 2023-09-14 MED ORDER — ACETAMINOPHEN 325 MG PO TABS
650.0000 mg | ORAL_TABLET | Freq: Four times a day (QID) | ORAL | Status: DC | PRN
  Administered 2023-09-14 – 2023-09-16 (×4): 650 mg via ORAL
  Filled 2023-09-14 (×4): qty 2

## 2023-09-14 MED ORDER — SODIUM CHLORIDE 0.9% FLUSH: 3.0000 mL | INTRAVENOUS | Status: DC | PRN

## 2023-09-14 MED ORDER — FLUTICASONE PROPIONATE 50 MCG/ACT NA SUSP
1.0000 | Freq: Every day | NASAL | Status: DC
  Administered 2023-09-14 – 2023-09-16 (×3): 1 via NASAL
  Filled 2023-09-14: qty 16

## 2023-09-14 MED ORDER — ALBUTEROL SULFATE HFA 108 (90 BASE) MCG/ACT IN AERS: 2.0000 | INHALATION_SPRAY | Freq: Four times a day (QID) | RESPIRATORY_TRACT | Status: DC | PRN

## 2023-09-14 MED ORDER — ENOXAPARIN SODIUM 40 MG/0.4ML IJ SOSY
40.0000 mg | PREFILLED_SYRINGE | INTRAMUSCULAR | Status: DC
  Administered 2023-09-15 – 2023-09-16 (×3): 40 mg via SUBCUTANEOUS
  Filled 2023-09-14 (×3): qty 0.4

## 2023-09-14 MED ORDER — MAGNESIUM OXIDE -MG SUPPLEMENT 400 (240 MG) MG PO TABS
400.0000 mg | ORAL_TABLET | Freq: Every day | ORAL | Status: DC
  Administered 2023-09-15 – 2023-09-17 (×3): 400 mg via ORAL
  Filled 2023-09-14 (×3): qty 1

## 2023-09-14 MED ORDER — ALLOPURINOL 100 MG PO TABS
100.0000 mg | ORAL_TABLET | Freq: Two times a day (BID) | ORAL | Status: DC
  Administered 2023-09-14 – 2023-09-17 (×6): 100 mg via ORAL
  Filled 2023-09-14 (×6): qty 1

## 2023-09-14 MED ORDER — PANTOPRAZOLE SODIUM 40 MG IV SOLR
40.0000 mg | INTRAVENOUS | Status: DC
  Administered 2023-09-14 – 2023-09-16 (×3): 40 mg via INTRAVENOUS
  Filled 2023-09-14 (×3): qty 10

## 2023-09-14 MED ORDER — KCL-LACTATED RINGERS 20 MEQ/L IV SOLN
INTRAVENOUS | Status: DC
  Filled 2023-09-14: qty 1000

## 2023-09-14 MED ORDER — PROBIOTIC DAILY PO CAPS: ORAL_CAPSULE | Freq: Every day | ORAL | Status: DC | PRN

## 2023-09-14 MED ORDER — CHOLESTYRAMINE LIGHT 4 G PO PACK: 4.0000 g | PACK | Freq: Every day | ORAL | Status: DC | PRN

## 2023-09-14 MED ORDER — POLYVINYL ALCOHOL 1.4 % OP SOLN: 1.0000 [drp] | Freq: Every day | OPHTHALMIC | Status: DC | PRN

## 2023-09-14 MED ORDER — SODIUM CHLORIDE 0.9 % IV SOLN
3.0000 g | Freq: Four times a day (QID) | INTRAVENOUS | Status: DC
  Administered 2023-09-15 – 2023-09-17 (×9): 3 g via INTRAVENOUS
  Filled 2023-09-14 (×11): qty 8

## 2023-09-14 MED ORDER — AMLODIPINE BESYLATE 5 MG PO TABS
2.5000 mg | ORAL_TABLET | Freq: Every day | ORAL | Status: DC
Start: 2023-09-14 — End: 2023-09-17
  Administered 2023-09-14 – 2023-09-17 (×4): 2.5 mg via ORAL
  Filled 2023-09-14 (×4): qty 1

## 2023-09-14 MED ORDER — GABAPENTIN 300 MG PO CAPS
300.0000 mg | ORAL_CAPSULE | Freq: Every day | ORAL | Status: DC
  Administered 2023-09-14 – 2023-09-16 (×3): 300 mg via ORAL
  Filled 2023-09-14 (×3): qty 1

## 2023-09-14 MED ORDER — HYDRALAZINE HCL 20 MG/ML IJ SOLN: 10.0000 mg | INTRAMUSCULAR | Status: DC | PRN

## 2023-09-14 MED ORDER — MAGNESIUM 500 MG PO TABS: 500.0000 mg | ORAL_TABLET | Freq: Every day | ORAL | Status: DC

## 2023-09-14 MED ORDER — HYDROMORPHONE HCL 2 MG PO TABS: 1.0000 mg | ORAL_TABLET | ORAL | Status: DC | PRN

## 2023-09-14 MED ORDER — LORATADINE 10 MG PO TABS: 10.0000 mg | ORAL_TABLET | Freq: Every day | ORAL | Status: DC | PRN

## 2023-09-14 MED ORDER — SODIUM CHLORIDE 0.9 % IV SOLN
3.0000 g | Freq: Once | INTRAVENOUS | Status: AC
  Administered 2023-09-14: 3 g via INTRAVENOUS
  Filled 2023-09-14: qty 8

## 2023-09-14 MED ORDER — RISAQUAD PO CAPS: 1.0000 | ORAL_CAPSULE | Freq: Every day | ORAL | Status: DC | PRN

## 2023-09-14 MED ORDER — HYDROMORPHONE HCL 2 MG PO TABS: 4.0000 mg | ORAL_TABLET | ORAL | Status: DC | PRN

## 2023-09-14 MED ORDER — LACTULOSE 10 GM/15ML PO SOLN
20.0000 g | Freq: Once | ORAL | Status: AC
Start: 2023-09-14 — End: 2023-09-14
  Administered 2023-09-14: 20 g via ORAL
  Filled 2023-09-14: qty 30

## 2023-09-14 MED ORDER — IOHEXOL 350 MG/ML SOLN
75.0000 mL | Freq: Once | INTRAVENOUS | Status: AC | PRN
  Administered 2023-09-14: 75 mL via INTRAVENOUS

## 2023-09-14 MED ORDER — ACETAMINOPHEN 650 MG RE SUPP: 650.0000 mg | Freq: Four times a day (QID) | RECTAL | Status: DC | PRN

## 2023-09-14 MED ORDER — POLYETHYLENE GLYCOL 3350 17 G PO PACK: 17.0000 g | PACK | Freq: Every day | ORAL | Status: DC | PRN

## 2023-09-14 MED ORDER — CYCLOSPORINE 0.05 % OP EMUL
1.0000 [drp] | Freq: Two times a day (BID) | OPHTHALMIC | Status: DC
  Administered 2023-09-14 – 2023-09-17 (×6): 1 [drp] via OPHTHALMIC
  Filled 2023-09-14 (×6): qty 30

## 2023-09-14 MED ORDER — IOHEXOL 300 MG/ML  SOLN
100.0000 mL | Freq: Once | INTRAMUSCULAR | Status: AC | PRN
  Administered 2023-09-14: 100 mL via INTRAVENOUS

## 2023-09-14 MED ORDER — LEVOTHYROXINE SODIUM 75 MCG PO TABS
75.0000 ug | ORAL_TABLET | ORAL | Status: DC
  Administered 2023-09-16: 75 ug via ORAL
  Filled 2023-09-14: qty 1

## 2023-09-14 MED ORDER — PIPERACILLIN-TAZOBACTAM 3.375 G IVPB 30 MIN
3.3750 g | Freq: Once | INTRAVENOUS | Status: AC
  Administered 2023-09-14: 3.375 g via INTRAVENOUS
  Filled 2023-09-14: qty 50

## 2023-09-14 MED ORDER — SODIUM CHLORIDE 0.9% FLUSH
3.0000 mL | Freq: Two times a day (BID) | INTRAVENOUS | Status: DC
  Administered 2023-09-14 – 2023-09-16 (×5): 3 mL via INTRAVENOUS

## 2023-09-14 MED ORDER — IRBESARTAN 150 MG PO TABS
300.0000 mg | ORAL_TABLET | Freq: Every day | ORAL | Status: DC
  Administered 2023-09-14 – 2023-09-17 (×4): 300 mg via ORAL
  Filled 2023-09-14 (×4): qty 2

## 2023-09-14 MED ORDER — SENNOSIDES-DOCUSATE SODIUM 8.6-50 MG PO TABS
2.0000 | ORAL_TABLET | Freq: Two times a day (BID) | ORAL | Status: DC
  Administered 2023-09-14 – 2023-09-17 (×3): 2 via ORAL
  Filled 2023-09-14 (×4): qty 2

## 2023-09-14 MED ORDER — ALBUTEROL SULFATE (2.5 MG/3ML) 0.083% IN NEBU: 2.5000 mg | INHALATION_SOLUTION | Freq: Four times a day (QID) | RESPIRATORY_TRACT | Status: DC | PRN

## 2023-09-14 MED ORDER — ROSUVASTATIN CALCIUM 5 MG PO TABS
5.0000 mg | ORAL_TABLET | Freq: Every day | ORAL | Status: DC
  Administered 2023-09-15 – 2023-09-17 (×3): 5 mg via ORAL
  Filled 2023-09-14 (×3): qty 1

## 2023-09-14 MED ORDER — POTASSIUM CHLORIDE 2 MEQ/ML IV SOLN
INTRAVENOUS | Status: AC
  Filled 2023-09-14 (×3): qty 1000

## 2023-09-14 MED ORDER — ASPIRIN 81 MG PO TBEC
81.0000 mg | DELAYED_RELEASE_TABLET | Freq: Every day | ORAL | Status: DC
  Administered 2023-09-14 – 2023-09-16 (×3): 81 mg via ORAL
  Filled 2023-09-14 (×3): qty 1

## 2023-09-14 MED ORDER — LEVOTHYROXINE SODIUM 150 MCG PO TABS
150.0000 ug | ORAL_TABLET | ORAL | Status: DC
  Administered 2023-09-15 – 2023-09-17 (×2): 150 ug via ORAL
  Filled 2023-09-14 (×2): qty 1

## 2023-09-14 MED ORDER — SODIUM CHLORIDE 0.9% FLUSH
3.0000 mL | Freq: Two times a day (BID) | INTRAVENOUS | Status: DC
  Administered 2023-09-14 – 2023-09-16 (×3): 3 mL via INTRAVENOUS

## 2023-09-14 MED ORDER — CHOLESTYRAMINE 4 GM/DOSE PO POWD: 4.0000 g | ORAL | Status: DC | PRN

## 2023-09-14 NOTE — Plan of Care (Signed)

## 2023-09-14 NOTE — Assessment & Plan Note (Signed)
 This is chronic, not in flare.  Continue with Claritin and Flonase.

## 2023-09-14 NOTE — Assessment & Plan Note (Signed)
 This is chronic, not in flare, continue with allopurinol

## 2023-09-14 NOTE — Assessment & Plan Note (Addendum)
 This post lactulose  in the ER, I will continue treatment with senna and docusate.  Given that patient is reporting poor oral intake at this time, I will order limited duration hydration.  I suspect patient's lower abdominal discomfort as well as some vomiting episodes are likely due to constipation itself.  We we will monitor patient for p.o. intake.  Ordered for clear liquid diet and to advance as tolerated.

## 2023-09-14 NOTE — H&P (Addendum)
 History and Physical    Patient: [name] Pratt FMW:[id number] DOB: 1940-10-14 DOA: 09/13/2023 DOS: the patient was seen and examined on 09/14/2023 PCP: Daryl Setter, NP  Patient coming from: Home > [facility] ER > [facility] inpatient unit  Chief Complaint:  Chief Complaint  Patient presents with   Abdominal Pain   Emesis   HPI: [name] Pratt is a 83 y.o. male with medical history significant of remote smoking history.  Patient reports having a history of COPD, which is corroborated on record review below.  However patient does not use supplementary oxygen at home.  Patient reports having had very poor bowel movement for approximately 7 days.  Patient reports good fluid intake at the time.  Patient reports being in his usual state of health till approximately 3 days ago.  Patient reports since that time having periumbilical versus bilateral lower abdominal area pain/discomfort.  For the last 48 hours patient has had episodic regurgitation of food or stomach contacts into his mouth.  That patient has spit up.  There is no bleeding.  Also patient has had episodes of emesis.  Again no bleeding.  Patient only has 2 or 3 episodes of emesis in a 24-hour period at the most.  There is no documented history of fever.  No burning with micturition.  Patient reports chronic nocturia polyuria that is attributed to his prostate concerns.  Patient does see urology as an outpatient already.  Patient presented to [facility] ER late last evening after discussing his above symptoms with the primary care provider.  CAT scan in the ER is nonactionable.  Although it does show hiatal hernia as well.  Patient reports having had a soft BM earlier today.  It is noted that the patient was treated with lactulose  in the ER.  Furthermore patient was found to be hypoxic in the ER and had CAT scan abdomen pelvis done as well as CT PE study.  There is no pulmonary embolism found.  However patient does have findings  consistent with aspiration pneumonia.  Patient did not report any sensation of shortness of bre ath cough chest pain and again no fever or leg swelling.  Medical evaluation is sought.  Patinet report poor apetties. Review of Systems: Chronic dry eyes under Rx. Chornic dry mouth under ENT evalaution as outpatient.   As mentioned in the history of present illness. All other systems reviewed and are negative. Past Medical History:  Diagnosis Date   Allergic rhinitis 02/02/2016   Anemia 12/17/2009   Formatting of this note might be different from the original. 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 Hypertension   Benign paroxysmal positional vertigo 09/14/2021   Bilateral sacroiliitis (HCC) 07/22/2021   Body mass index (BMI) 25.0-25.9, adult 11/02/2020   Cancer (HCC)    skin cancer   Cardiac murmur 07/25/2021   Carotid stenosis    a. Carotid U/S 5/13: LICA < 50%, RICA 50-69%;  b.  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   Cystoid macular edema of both eyes 07/28/2020   Degenerative disc disease, lumbar 06/30/2015   Deviated nasal septum 02/18/2015   Diverticulosis 03/03/2020   DJD (degenerative joint disease) of hip 12/24/2012   Dyspnea 06/12/2011   Emphysema, unspecified (HCC) 03/05/2020   Erectile dysfunction 06/20/2017   Exudative age-related macular  degeneration of right eye with active choroidal neovascularization (HCC) 02/09/2021   Gait disorder 03/02/2021   GERD (gastroesophageal reflux disease)    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   Qualifier: Diagnosis of  By: Gwenn Grimes     Greater trochanteric pain syndrome 07/22/2021   Hand pain, left 01/16/2022   Hearing loss 02/02/2016   Heme positive stool 02/29/2020   HIATAL HERNIA 05/13/2009   Qualifier: Diagnosis of  By: Gwenn Grimes     High risk medication use 01/18/2012   Hip pain 03/02/2021   History of chicken pox    History of COVID-19 12/05/2021   History of hemorrhoids    History of kidney stones    History of skin cancer 07/22/2021   skin cancer   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 Osteoarthritis Of The Hand  10/1 IMO update   Osteoarthrosis, unspecified whether generalized or localized, pelvic region and thigh 07/25/2010   Formatting of this note might be different from the original. Osteoarthritis Of The Hip   Other abnormal glucose 07/22/2021   Other allergic rhinitis 02/18/2015   Palpitations 07/25/2021   Peripheral neuropathy 06/19/2016   Preventative health care  11/24/2015   Right groin pain 11/29/2012   Right inguinal hernia 05/13/2009   Qualifier: Diagnosis of  By: Gwenn Grimes     RLS (restless legs syndrome) 02/18/2015   Rosacea    Rotator cuff tear 07/27/2013   Situational depression 03/02/2021   Skin lesion 01/16/2022   Sleep apnea with use of continuous positive airway pressure (CPAP) 07/15/2013   CPAP set to  7 cm water,  Residual AHi was 7.8  With no leak.  User time 6 hours.  479 days , not used only 12 days - highly compliant 06-23-13  .    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   Urinary retention 11/26/2020   Ventral hernia 07/08/2012   Vertigo 07/08/2021   Weight loss 03/03/2020  Past Surgical History:  Procedure Laterality Date   ABDOMINAL HERNIA REPAIR  07/15/12   Dr Kingston   ANTERIOR CERVICAL DECOMP/DISCECTOMY FUSION N/A 07/09/2020   Procedure: ANTERIOR CERVICAL DECOMPRESSION AND FUSION CERVICAL THREE-FOUR.;  Surgeon: Onetha Kuba, MD;  Location: [facility] OR;  Service: Neurosurgery;  Laterality: N/A;  anterior   BROW LIFT  05/07/01   COLONOSCOPY WITH PROPOFOL  N/A 04/24/2022   Procedure: COLONOSCOPY WITH PROPOFOL ;  Surgeon: Eda Iha, MD;  Location[facility] ENDOSCOPY;  Service: Gastroenterology;  Laterality: N/A;   COLONOSCOPY WITH PROPOFOL  N/A 04/26/2022   Procedure: COLONOSCOPY WITH PROPOFOL ;  Surgeon: Eda Iha, MD;  Location: [facility] ENDOSCOPY;  Service: Gastroenterology;  Laterality: N/A;   CYSTOSCOPY WITH URETEROSCOPY AND STENT PLACEMENT Right 01/28/2014   Procedure: CYSTOSCOPY WITH URETEROSCOPY, BASKET RETRIVAL AND  STENT PLACEMENT;  Surgeon: Alm GORMAN Fragmin, MD;  Location: [facility] ORS;  Service:  Urology;  Laterality: Right;   epidural injections     multiple procedures   ESOPHAGEAL DILATION  1993 and 1994   multiple times   EYE SURGERY Bilateral 03/25/10, 2012   cataract removal   HEMORRHOID SURGERY  2014   HIATAL HERNIA REPAIR  12/21/93   HOLMIUM LASER APPLICATION Right 01/28/2014   Procedure: HOLMIUM LASER APPLICATION;  Surgeon: Alm GORMAN Fragmin, MD;  Location: [facility] ORS;  Service: Urology;  Laterality: Right;   INGUINAL HERNIA REPAIR Right 07/15/12   Dr Kingston, x2   JOINT REPLACEMENT  07/25/04   right knee   JOINT REPLACEMENT  07/13/10   left knee   KNEE SURGERY  1995   LAPAROSCOPIC CHOLECYSTECTOMY  09/20/06   with intraoperative cholangiogram and right inguinal herniorrhaphy with mesh    LIGAMENT REPAIR Left 07/2013   shoulder   LITHOTRIPSY     x2   LUMBAR LAMINECTOMY/DECOMPRESSION MICRODISCECTOMY Bilateral 12/27/2022   Procedure: Lumbar Two-Three, Lumbar Three-Four Sublaminar Decompression;  Surgeon: Onetha Kuba, MD;  Location: [facility] OR;  Service: Neurosurgery;  Laterality: Bilateral;   NASAL SEPTUM SURGERY  1975   POLYPECTOMY  04/26/2022   Procedure: POLYPECTOMY;  Surgeon: Eda Iha, MD;  Location: [facility] ENDOSCOPY;  Service: Gastroenterology;;   POSTERIOR CERVICAL FUSION/FORAMINOTOMY N/A 11/24/2020   Procedure: Posterior Cervical Laminectomy Cervical three-four, Cervical four-five with lateral mass fusion;  Surgeon: Onetha Kuba, MD;  Location: [facility] OR;  Service: Neurosurgery;  Laterality: N/A;   REFRACTIVE SURGERY Bilateral    Right total hip replacement  4/14   SKIN CANCER DESTRUCTION     nose, ear   TONSILLECTOMY  as child   TOTAL HIP ARTHROPLASTY Left    URETHRAL DILATION  1991   Dr. Watt   Social History:  reports that he quit smoking about 45 years ago. His smoking use included cigarettes. He started smoking about 56 years ago. He has never used smokeless tobacco. He reports that he does not currently use alcohol . He reports that he does not use drugs.  Allergies   Allergen Reactions   Pravastatin  Other (See Comments)    Muscle cramps   Sildenafil  Other (See Comments)    Tachycardia     Oxycodone  Other (See Comments)    Hallucinations    Atenolol Other (See Comments)    Bradycardia    Metoclopramide Hcl Anxiety    Family History  Problem Relation Age of Onset   Cancer Mother    Hypertension Other    Cancer Other    Osteoarthritis Other    Stomach cancer Neg Hx    Colon cancer Neg Hx    Esophageal cancer Neg  Hx     Prior to Admission medications   Medication Sig Start Date End Date Taking? Authorizing Provider  acetaminophen  (TYLENOL ) 500 MG tablet Take 500 mg by mouth at bedtime as needed for moderate pain (pain score 4-6).   Yes [provider]  albuterol  (VENTOLIN  HFA) 108 (90 Base) MCG/ACT inhaler INHALE 2 PUFFS INTO THE LUNGS EVERY 6 HOURS AS NEEDED FOR SHORTNESS OF BREATH. Patient taking differently: Inhale 2 puffs into the lungs every 6 (six) hours as needed for shortness of breath. 10/20/21  Yes O'Sullivan, Melissa, NP  allopurinol  (ZYLOPRIM ) 100 MG tablet Take 1 tablet (100 mg total) by mouth 2 (two) times daily. 07/17/23  Yes O'Sullivan, Melissa, NP  amLODipine  (NORVASC ) 2.5 MG tablet Take 1 tablet (2.5 mg total) by mouth 2 (two) times daily. HOLD AMLODIPINE  DISE IF BLOOD PRESSURE LESS THAN 110/50 Patient taking differently: Take 2.5 mg by mouth daily. HOLD AMLODIPINE  DISE IF BLOOD PRESSURE LESS THAN 110/50 07/17/23  Yes Daryl Setter, NP  antiseptic oral rinse (BIOTENE) LIQD 15 mLs by Mouth Rinse route 2 (two) times daily.   Yes [provider]  aspirin  EC 81 MG tablet Take 81 mg by mouth at bedtime.   Yes [provider]  bacitracin  ointment Apply 1 Application topically 2 (two) times daily. Patient taking differently: Apply 1 Application topically daily as needed for wound care. 09/15/22  Yes [name], [name], DO  cholestyramine  (QUESTRAN ) 4 GM/DOSE powder Take 4 g by mouth as needed (digestion). 30  ml   Yes [provider]  cycloSPORINE  (RESTASIS ) 0.05 % ophthalmic emulsion Place 1 drop into both eyes 2 (two) times daily.   Yes [provider]  docusate sodium  (COLACE) 100 MG capsule Take 1 capsule (100 mg total) by mouth every 12 (twelve) hours. Patient taking differently: Take 100 mg by mouth at bedtime. 12/30/22  Yes Geiple, Joshua, PA-C  fluticasone  (FLONASE ) 50 MCG/ACT nasal spray Place 1 spray into both nostrils at bedtime.   Yes [provider]  gabapentin  (NEURONTIN ) 300 MG capsule Take 1 capsule (300 mg total) by mouth at bedtime. 07/17/23  Yes Daryl Setter, NP  glucose blood test strip TRUE Metrix Blood Glucose Test Strip, use as instructed 11/12/20  Yes O'Sullivan, Melissa, NP  hydrALAZINE  (APRESOLINE ) 50 MG tablet Take 1 tablet (50 mg total) by mouth 3 (three) times daily. 07/17/23  Yes Daryl Setter, NP  Iron , Ferrous Sulfate , 325 (65 Fe) MG TABS Take 325 mg by mouth every other day. Patient taking differently: Take 325 mg by mouth daily. 08/10/22  Yes Daryl Setter, NP  Lancets MISC 1 each by Does not apply route 2 (two) times daily. TRUE matrix lancets 11/12/20  Yes Daryl Setter, NP  levothyroxine  (SYNTHROID ) 150 MCG tablet TAKE 1 TABLET ON 6 DAYS PER WEEK (TAKE 75 MCG ONE TIME PER WEEK) Patient taking differently: Take 150 mcg by mouth as directed. TAKE 1 TABLET ON 6 DAYS PER WEEK (TAKE 75 MCG ONE TIME PER WEEK ON SUNDAYS) 06/14/23  Yes Daryl Setter, NP  levothyroxine  (SYNTHROID ) 75 MCG tablet Take 75 mcg once a week (patient on 150 mcg 6 days a week) Dispense #24 for 90 day supply. Patient taking differently: Take 75 mcg by mouth once a week. Sundays; patient on 150 mcg 6 days a week; Dispense #24 for 90 day supply. 07/17/23  Yes Daryl Setter, NP  loratadine  (CLARITIN ) 10 MG tablet Take 10 mg by mouth daily as needed for allergies or rhinitis.   Yes [provider]  Magnesium  500 MG TABS Take 500 mg by mouth  daily.   Yes [provider]  Menthol , Topical Analgesic, (ICY HOT EX) Apply 1 Application topically at bedtime. Lidocaine  / on back   Yes [provider]  metroNIDAZOLE  (METROCREAM ) 0.75 % cream Apply 1 Application topically daily as needed (head).   Yes [provider]  multivitamin-lutein  (OCUVITE-LUTEIN ) CAPS capsule Take 1 capsule by mouth daily.   Yes [provider]  nitroGLYCERIN  (NITROSTAT ) 0.4 MG SL tablet Place 1 tablet (0.4 mg total) under the tongue every 5 (five) minutes as needed for chest pain. 09/16/21  Yes Delford Maude BROCKS, MD  OVER THE COUNTER MEDICATION Place 1 spray into both nostrils 2 (two) times daily. Sinus saline   Yes [provider]  OVER THE COUNTER MEDICATION Take 15 mLs by mouth at bedtime. Yellow Mustard   Yes [provider]  polyethylene glycol powder (GLYCOLAX /MIRALAX ) 17 GM/SCOOP powder Take 17 g by mouth 2 (two) times daily as needed for mild constipation, moderate constipation or severe constipation. 12/30/22  Yes Geiple, Joshua, PA-C  polyvinyl alcohol  (ARTIFICIAL TEARS) 1.4 % ophthalmic solution Place 1 drop into both eyes daily as needed for dry eyes.   Yes [provider]  PREBIOTIC PRODUCT PO Take 1 tablet by mouth daily.   Yes [provider]  PRESCRIPTION MEDICATION CPAP- At bedtime and during any time of rest   Yes [provider]  Probiotic Product (PROBIOTIC DAILY PO) Take 1 capsule by mouth daily as needed (Constipation).   Yes [provider]  rosuvastatin  (CRESTOR ) 5 MG tablet Take 1 tablet (5 mg total) by mouth daily. 07/17/23  Yes Daryl Setter, NP  valsartan  (DIOVAN ) 320 MG tablet Take 1 tablet by mouth once daily 08/27/23  Yes O'Sullivan, Melissa, NP  vitamin B-12 (CYANOCOBALAMIN ) 500 MCG tablet Take 500 mcg by mouth in the morning.   Yes [provider]    Physical Exam: Vitals:   09/14/23 1100 09/14/23 1200 09/14/23 1300 09/14/23 1520  BP:  (!) 152/79 (!) 154/73 (!) 163/70 (!) 161/66  Pulse: (!) 46 80 (!) 44 (!) 52  Resp: 15 18 16 20   Temp: 98.3 F (36.8 C)  98.3 F (36.8 C) 97.8 F (36.6 C)  TempSrc: Temporal   Oral  SpO2: 94% 90% 95% 96%   General: Patient is alert and awake, slightly hard of hearing appears to be in no distress.  On 2 L/min of supplementary oxygen.  Patient gives a coherent account of his symptoms Respiratory exam: Bilateral intravesicular Cardiovascular exam S1-S2 normal, bradycardia Abdomen all quadrants soft, some suprapubic discomfort/tenderness no guarding or rebound.  Bowel sounds are normal Extremities warm without edema. Data Reviewed:  Labs on Admission:  Results for orders placed or performed during the hospital encounter of 09/13/23 (from the past 24 hours)  Lipase, blood     Status: None   Collection Time: 09/13/23  7:56 PM  Result Value Ref Range   Lipase 30 11 - 51 U/L  Comprehensive metabolic panel     Status: Abnormal   Collection Time: 09/13/23  7:56 PM  Result Value Ref Range   Sodium 139 135 - 145 mmol/L   Potassium 3.6 3.5 - 5.1 mmol/L   Chloride 103 98 - 111 mmol/L   CO2 27 22 - 32 mmol/L   Glucose, Bld 132 (H) 70 - 99 mg/dL   BUN 29 (H) 8 - 23 mg/dL   Creatinine, Ser 8.70 (H) 0.61 - 1.24 mg/dL  Calcium  10.0 8.9 - 10.3 mg/dL   Total Protein 8.2 (H) 6.5 - 8.1 g/dL   Albumin  4.4 3.5 - 5.0 g/dL   AST 30 15 - 41 U/L   ALT 22 0 - 44 U/L   Alkaline Phosphatase 76 38 - 126 U/L   Total Bilirubin 0.5 0.0 - 1.2 mg/dL   GFR, Estimated 55 (L) >60 mL/min   Anion gap 9 5 - 15  CBC     Status: Abnormal   Collection Time: 09/13/23  7:56 PM  Result Value Ref Range   WBC 13.8 (H) 4.0 - 10.5 K/uL   RBC 3.97 (L) 4.22 - 5.81 MIL/uL   Hemoglobin 12.9 (L) 13.0 - 17.0 g/dL   HCT 60.6 60.9 - 47.9 %   MCV 99.0 80.0 - 100.0 fL   MCH 32.5 26.0 - 34.0 pg   MCHC 32.8 30.0 - 36.0 g/dL   RDW 86.8 88.4 - 84.4 %   Platelets 351 150 - 400 K/uL   nRBC 0.0 0.0 - 0.2 %  Brain natriuretic  peptide     Status: Abnormal   Collection Time: 09/13/23  7:56 PM  Result Value Ref Range   B Natriuretic Peptide 232.3 (H) 0.0 - 100.0 pg/mL  Urinalysis, Routine w reflex microscopic -Urine, Clean Catch     Status: Abnormal   Collection Time: 09/13/23  8:03 PM  Result Value Ref Range   Color, Urine YELLOW YELLOW   APPearance CLEAR CLEAR   Specific Gravity, Urine 1.025 1.005 - 1.030   pH 6.5 5.0 - 8.0   Glucose, UA NEGATIVE NEGATIVE mg/dL   Hgb urine dipstick NEGATIVE NEGATIVE   Bilirubin Urine NEGATIVE NEGATIVE   Ketones, ur NEGATIVE NEGATIVE mg/dL   Protein, ur 899 (A) NEGATIVE mg/dL   Nitrite NEGATIVE NEGATIVE   Leukocytes,Ua NEGATIVE NEGATIVE  Urinalysis, Microscopic (reflex)     Status: None   Collection Time: 09/13/23  8:03 PM  Result Value Ref Range   RBC / HPF 0-5 0 - 5 RBC/hpf   WBC, UA NONE SEEN 0 - 5 WBC/hpf   Bacteria, UA NONE SEEN NONE SEEN   Squamous Epithelial / HPF 0-5 0 - 5 /HPF  Troponin I (High Sensitivity)     Status: Abnormal   Collection Time: 09/13/23 11:38 PM  Result Value Ref Range   Troponin I (High Sensitivity) 37 (H) <18 ng/L  Lactic acid, plasma     Status: None   Collection Time: 09/13/23 11:38 PM  Result Value Ref Range   Lactic Acid, Venous 1.0 0.5 - 1.9 mmol/L  Lactic acid, plasma     Status: None   Collection Time: 09/14/23  1:49 AM  Result Value Ref Range   Lactic Acid, Venous 0.9 0.5 - 1.9 mmol/L  Troponin I (High Sensitivity)     Status: Abnormal   Collection Time: 09/14/23  1:49 AM  Result Value Ref Range   Troponin I (High Sensitivity) 38 (H) <18 ng/L   *Note: Due to a large number of results and/or encounters for the requested time period, some results have not been displayed. A complete set of results can be found in Results Review.   Basic Metabolic Panel: Recent Labs  Lab 09/13/23 1956  NA 139  K 3.6  CL 103  CO2 27  GLUCOSE 132*  BUN 29*  CREATININE 1.29*  CALCIUM  10.0   Liver Function Tests: Recent Labs  Lab  09/13/23 1956  AST 30  ALT 22  ALKPHOS 76  BILITOT 0.5  PROT 8.2*  ALBUMIN  4.4   Recent Labs  Lab 09/13/23 1956  LIPASE 30   No results for input(s): AMMONIA in the last 168 hours. CBC: Recent Labs  Lab 09/13/23 1956  WBC 13.8*  HGB 12.9*  HCT 39.3  MCV 99.0  PLT 351   Cardiac Enzymes: Recent Labs  Lab 09/13/23 2338 09/14/23 0149  TROPONINIHS 37* 38*    BNP (last 3 results) No results for input(s): PROBNP in the last 8760 hours. CBG: No results for input(s): GLUCAP in the last 168 hours.  Radiological Exams on Admission:  CT Angio Chest PE W and/or Wo Contrast Result Date: 09/14/2023 CLINICAL DATA:  83 year old male history of nausea and vomiting and atypical chest pain. Clinical concern for pulmonary embolism. Positive D-dimer. EXAM: CT ANGIOGRAPHY CHEST WITH CONTRAST TECHNIQUE: Multidetector CT imaging of the chest was performed using the standard protocol during bolus administration of intravenous contrast. Multiplanar CT image reconstructions and MIPs were obtained to evaluate the vascular anatomy. RADIATION DOSE REDUCTION: This exam was performed according to the departmental dose-optimization program which includes automated exposure control, adjustment of the mA and/or kV according to patient size and/or use of iterative reconstruction technique. CONTRAST:  75mL OMNIPAQUE  IOHEXOL  350 MG/ML SOLN COMPARISON:  Chest CT 08/30/2020. FINDINGS: Cardiovascular: No filling defects are noted in the pulmonary arterial tree to suggest pulmonary embolism. Heart size is borderline enlarged. There is no significant pericardial fluid, thickening or pericardial calcification. There is aortic atherosclerosis, as well as atherosclerosis of the great vessels of the mediastinum and the coronary arteries, including calcified atherosclerotic plaque in the left main, left anterior descending, left circumflex and right coronary arteries. Calcifications of the aortic valve and mitral  annulus. Mediastinum/Nodes: No pathologically enlarged mediastinal or hilar lymph nodes. Moderate-sized hiatal hernia. No axillary lymphadenopathy. Lungs/Pleura: Patchy areas of ground-glass attenuation, septal thickening, regional architectural distortion and volume loss are noted in the basal portions of the lower lobes of the lungs bilaterally. No confluent consolidative airspace disease. No pleural effusions. No definite suspicious appearing pulmonary nodules or masses are noted. Upper Abdomen: Aortic atherosclerosis.  Status post cholecystectomy. Musculoskeletal: There are no aggressive appearing lytic or blastic lesions noted in the visualized portions of the skeleton. Review of the MIP images confirms the above findings. IMPRESSION: 1. No evidence of pulmonary embolism. 2. Basal predominant infectious/inflammatory changes in the lower lobes of the lungs bilaterally. Clinical correlation for signs and symptoms of aspiration pneumonitis/pneumonia is recommended. 3. Aortic atherosclerosis, in addition to left main and three-vessel coronary artery disease. 4. There are calcifications of the aortic valve and mitral annulus. Echocardiographic correlation for evaluation of potential valvular dysfunction may be warranted if clinically indicated. Aortic Atherosclerosis (ICD10-I70.0). Electronically Signed   By: Toribio Aye M.D.   On: 09/14/2023 05:32   DG Chest Portable 1 View Result Date: 09/14/2023 CLINICAL DATA:  Hypoxia EXAM: PORTABLE CHEST 1 VIEW COMPARISON:  04/26/2023 FINDINGS: Cardiac shadow is stable. Mild left basilar atelectasis is noted. Mild central vascular congestion is noted without edema. No bony abnormality is seen. IMPRESSION: Left basilar atelectasis. Mild vascular congestion. Electronically Signed   By: Oneil Devonshire M.D.   On: 09/14/2023 01:23   CT ABDOMEN PELVIS W CONTRAST Result Date: 09/14/2023 CLINICAL DATA:  Abdominal pain, acute, nonlocalized EXAM: CT ABDOMEN AND PELVIS WITH  CONTRAST TECHNIQUE: Multidetector CT imaging of the abdomen and pelvis was performed using the standard protocol following bolus administration of intravenous contrast. RADIATION DOSE REDUCTION: This exam was performed according to the departmental dose-optimization  program which includes automated exposure control, adjustment of the mA and/or kV according to patient size and/or use of iterative reconstruction technique. CONTRAST:  OMNIPAQUE  IOHEXOL  300 MG/ML  SOLN COMPARISON:  CT abdomen pelvis 12/03/2022. FINDINGS: Lower chest: Small to moderate volume hiatal hernia. Mitral annular calcification. Hepatobiliary: Diffusely hypodense hepatic parenchyma compared to the spleen. No focal liver abnormality. Status post cholecystectomy. No biliary dilatation. Pancreas: No focal lesion. Normal pancreatic contour. No surrounding inflammatory changes. No main pancreatic ductal dilatation. Spleen: Normal in size without focal abnormality. Adrenals/Urinary Tract: No adrenal nodule bilaterally. Bilateral kidneys enhance symmetrically. 1 cm right nephrolithiasis. No left nephrolithiasis. No ureterolithiasis with limited evaluation of distal ureters due to streak artifact originating from the bilateral femoral hardware. No hydronephrosis. No hydroureter. The urinary bladder is unremarkable. Stomach/Bowel: Stomach is within normal limits. No evidence of bowel wall thickening or dilatation. Likely peristalsis of the distal sigmoid colon (301:58, 601:49). Stool throughout the colon. Colonic diverticulosis. Appendix appears normal. Vascular/Lymphatic: No abdominal aorta or iliac aneurysm. Mild atherosclerotic plaque of the aorta and its branches. No abdominal, pelvic, or inguinal lymphadenopathy. Reproductive: Prostate is unremarkable. Other: No intraperitoneal free fluid. No intraperitoneal free gas. No organized fluid collection. Musculoskeletal: Tiny fat containing umbilical hernia with an abdominal defect of 0.5 cm. No  suspicious lytic or blastic osseous lesions. No acute displaced fracture. Multilevel degenerative changes of the spine. Bilateral hip total hip arthroplasty. IMPRESSION: 1. Small to moderate volume hiatal hernia. 2. Stool throughout the colon-correlate for constipation. 3. Colonic diverticulosis with no acute diverticulitis. 4. Likely peristalsis of the distal sigmoid colon. Recommend follow up to exclude underlying bowel wall thickening. 5. Nonobstructive 1 cm right nephrolithiasis. 6. Hepatic steatosis. 7.  Aortic Atherosclerosis (ICD10-I70.0). 8. Status post cholecystectomy. Electronically Signed   By: Morgane  Naveau M.D.   On: 09/14/2023 00:46    EKG: Independently reviewed. Sinus brady   Assessment and Plan: * Acute respiratory failure with hypoxia (HCC) Patient noted to be hypoxic with ambulation and requiring 2 L/min of supplementary oxygen at rest.  Patient is not in respiratory distress.  We patient has received Zosyn  in the ER.  We will treat with Unasyn  and azithromycin .  Troponin elevation is adequately explained by this problem.  I do not think further investigation for that is warranted.  Dry eyes Chornic. present since before admission.  Patient does not have a diagnosis of Sjogren syndrome.  However is chronically on cyclosporine  eyedrops as well as polyvinyl alcohol  eyedrops.  Continue with same.  Patinet also complaint chornic of dry mouth - will need outpatient work up.  Sinus bradycardia Patient is noted to be sinus bradycardic today.  Please note that the EKG from April 23, 2022 similarly demonstrated sinus bradycardia.  Therefore I suspect this is chronic.  I will however check a TSH given patient's prominent complaint of constipation as well as finding of sinus bradycardia.  Constipation This post lactulose  in the ER, I will continue treatment with senna and docusate.  Given that patient is reporting poor oral intake at this time, I will order limited duration  hydration.  I suspect patient's lower abdominal discomfort as well as some vomiting episodes are likely due to constipation itself.  We we will monitor patient for p.o. intake.  Ordered for clear liquid diet and to advance as tolerated.  Hyperlipidemia Is chronically documented in the chart, continue with rosuvastatin  as well as daily baby aspirin .  HTN (hypertension) Chronic, continue with amlodipine , hydralazine , irbesartan .  Given patient current concern of  nausea vomiting and inability to tolerate p.o. diet I will order as needed hydralazine  as needed.  Leg cramps C/w gabapentin   Allergic rhinitis This is chronic, not in flare.  Continue with Claritin  and Flonase .  GERD (gastroesophageal reflux disease) This diagnosis is chronically/previously documented in the chart.  Besides vomiting episode, patient does report episodes of regurgitation of food into his mouth last couple of days.  Which was pretty dark/brown.  This would be consistent with Prelu-2 vomiting.  At this time I think we should monitor patient's symptom resolution as constipation is treated.  We will continue with cholestyramine  that the patient takes as needed for indigestion as an outpatient.  I will order pantoprazole  at this time.  Patient is also noted to have hiatal hernia on CAT scan.  Further workup as outpatient.  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  Rosacea This is not apparent today.  Continue with metronidazole  as needed.  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   Addeendum - patient complainig of severe low back pain. Radiation bialteirally. Given a.w. consitaption, will get mri L spine stat. Pain meds ordered   Advance Care Planning:   Code Status: Full Code discucssed with patient.  Consults: none this evening.   Family Communication: per  patient.  Severity of Illness: The appropriate patient status for this patient is OBSERVATION. Observation status is judged to be reasonable and necessary in order to provide the required intensity of service to ensure the patient's safety. The patient's presenting symptoms, physical exam findings, and initial radiographic and laboratory data in the context of their medical condition is felt to place them at decreased risk for further clinical deterioration. Furthermore, it is anticipated that the patient will be medically stable for discharge from the hospital within 2 midnights of admission.   Author: Jacqulyn Divine, MD 09/14/2023 4:55 PM  For on call review [email or web address].

## 2023-09-14 NOTE — Assessment & Plan Note (Addendum)
 This diagnosis is chronically/previously documented in the chart.  Besides vomiting episode, patient does report episodes of regurgitation of food into his mouth last couple of days.  Which was pretty dark/brown.  This would be consistent with Prelu-2 vomiting.  At this time I think we should monitor patient's symptom resolution as constipation is treated.  We will continue with cholestyramine  that the patient takes as needed for indigestion as an outpatient.  I will order pantoprazole  at this time.  Patient is also noted to have hiatal hernia on CAT scan.  Further workup as outpatient.  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

## 2023-09-14 NOTE — Assessment & Plan Note (Signed)
 Patient is noted to be sinus bradycardic today.  Please note that the EKG from April 23, 2022 similarly demonstrated sinus bradycardia.  Therefore I suspect this is chronic.  I will however check a TSH given patient's prominent complaint of constipation as well as finding of sinus bradycardia.

## 2023-09-14 NOTE — ED Notes (Signed)
 Pt o2 levels dropped to 84% on room air w/ ambulation

## 2023-09-14 NOTE — Assessment & Plan Note (Signed)
C/w gabapentin

## 2023-09-14 NOTE — ED Notes (Signed)
 [facility] 8-10 min ETA

## 2023-09-14 NOTE — Assessment & Plan Note (Signed)
 Chronic, continue with amlodipine, hydralazine, irbesartan.  Given patient current concern of nausea vomiting and inability to tolerate p.o. diet I will order as needed hydralazine as needed.

## 2023-09-14 NOTE — ED Notes (Signed)
Bladder scan 305

## 2023-09-14 NOTE — Assessment & Plan Note (Signed)
 Chornic. present since before admission.  Patient does not have a diagnosis of Sjogren syndrome.  However is chronically on cyclosporine  eyedrops as well as polyvinyl alcohol  eyedrops.  Continue with same.  Patinet also complaint chornic of dry mouth - will need outpatient work up.

## 2023-09-14 NOTE — Assessment & Plan Note (Signed)
 Continue with Synthroid 150 mcg 6 days a week and 75 mcg 1 day a week.

## 2023-09-14 NOTE — Assessment & Plan Note (Signed)
 Is chronically documented in the chart, continue with rosuvastatin as well as daily baby aspirin.

## 2023-09-14 NOTE — Progress Notes (Signed)
 Plan of Care Note for accepted transfer   Patient: [name] Pratt MRN: [id number]   DOA: 09/13/2023  Facility requesting transfer: [facility]. Requesting Provider: Dr. Carita. Reason for transfer: Respiratory failure secondary to aspiration. Facility course: 83 year old male with history of hypertension presents with abdominal discomfort and nausea vomiting.  CT abdomen pelvis shows features concerning for constipation.  CT angiogram of the chest shows aspiration pneumonitis.  Patient was requiring 2 L of oxygen and started on empiric antibiotics and admitted for further observation.  Plan of care: The patient is accepted for admission to Telemetry unit, at [facility].   Author: Redia LOISE Cleaver, MD 09/14/2023  Check [email or web address] for on-call coverage.  Nursing staff, Please call [facility] Admits & Consults System-Wide number on Amion as soon as patient's arrival, so appropriate admitting provider can evaluate the pt.

## 2023-09-14 NOTE — Assessment & Plan Note (Signed)
 This is not apparent today.  Continue with metronidazole as needed.

## 2023-09-14 NOTE — Progress Notes (Signed)
 Pharmacy Antibiotic Note  [name] Pratt is a 83 y.o. male admitted on 09/13/2023 with aspiration pneumonia. Pharmacy has been consulted for Unasyn  dosing.  Plan: Unasyn  3g IV q6h Monitor renal function, cultures, clinical course  Weight: 73.7 kg (162 lb 7.7 oz)  Temp (24hrs), Avg:98.2 F (36.8 C), Min:97.8 F (36.6 C), Max:98.5 F (36.9 C)  Recent Labs  Lab 09/13/23 1956 09/13/23 2338 09/14/23 0149  WBC 13.8*  --   --   CREATININE 1.29*  --   --   LATICACIDVEN  --  1.0 0.9    Estimated Creatinine Clearance: 39.2 mL/min (A) (by C-G formula based on SCr of 1.29 mg/dL (H)).    Allergies  Allergen Reactions   Pravastatin  Other (See Comments)    Muscle cramps   Sildenafil  Other (See Comments)    Tachycardia     Oxycodone  Other (See Comments)    Hallucinations    Atenolol Other (See Comments)    Bradycardia    Metoclopramide Hcl Anxiety    Antimicrobials this admission: 1/3 Zosyn  x 1 1/3 Azithromycin  >> (1/5) 1/3 Unasyn  >>  Dose adjustments this admission: --  Microbiology results: 1/3 BCx:   Thank you for allowing pharmacy to be a part of this patient's care.   Halah Whiteside, PharmD, BCPS Clinical Pharmacist 09/14/2023 5:27 PM

## 2023-09-14 NOTE — Assessment & Plan Note (Addendum)
 Patient noted to be hypoxic with ambulation and requiring 2 L/min of supplementary oxygen at rest.  Patient is not in respiratory distress.  We patient has received Zosyn  in the ER.  We will treat with Unasyn  and azithromycin .  Troponin elevation is adequately explained by this problem.  I do not think further investigation for that is warranted.

## 2023-09-15 ENCOUNTER — Observation Stay (HOSPITAL_COMMUNITY): Payer: Medicare HMO

## 2023-09-15 DIAGNOSIS — G2581 Restless legs syndrome: Secondary | ICD-10-CM | POA: Diagnosis present

## 2023-09-15 DIAGNOSIS — E785 Hyperlipidemia, unspecified: Secondary | ICD-10-CM | POA: Diagnosis present

## 2023-09-15 DIAGNOSIS — Z96653 Presence of artificial knee joint, bilateral: Secondary | ICD-10-CM | POA: Diagnosis present

## 2023-09-15 DIAGNOSIS — K219 Gastro-esophageal reflux disease without esophagitis: Secondary | ICD-10-CM | POA: Diagnosis present

## 2023-09-15 DIAGNOSIS — Z8616 Personal history of COVID-19: Secondary | ICD-10-CM | POA: Diagnosis not present

## 2023-09-15 DIAGNOSIS — I10 Essential (primary) hypertension: Secondary | ICD-10-CM

## 2023-09-15 DIAGNOSIS — E039 Hypothyroidism, unspecified: Secondary | ICD-10-CM

## 2023-09-15 DIAGNOSIS — K5900 Constipation, unspecified: Secondary | ICD-10-CM | POA: Diagnosis present

## 2023-09-15 DIAGNOSIS — N1831 Chronic kidney disease, stage 3a: Secondary | ICD-10-CM | POA: Diagnosis present

## 2023-09-15 DIAGNOSIS — R1030 Lower abdominal pain, unspecified: Secondary | ICD-10-CM | POA: Diagnosis present

## 2023-09-15 DIAGNOSIS — J9601 Acute respiratory failure with hypoxia: Secondary | ICD-10-CM | POA: Diagnosis present

## 2023-09-15 DIAGNOSIS — H905 Unspecified sensorineural hearing loss: Secondary | ICD-10-CM | POA: Diagnosis present

## 2023-09-15 DIAGNOSIS — R3589 Other polyuria: Secondary | ICD-10-CM | POA: Diagnosis present

## 2023-09-15 DIAGNOSIS — M109 Gout, unspecified: Secondary | ICD-10-CM | POA: Diagnosis present

## 2023-09-15 DIAGNOSIS — L719 Rosacea, unspecified: Secondary | ICD-10-CM | POA: Diagnosis present

## 2023-09-15 DIAGNOSIS — K449 Diaphragmatic hernia without obstruction or gangrene: Secondary | ICD-10-CM | POA: Diagnosis present

## 2023-09-15 DIAGNOSIS — J690 Pneumonitis due to inhalation of food and vomit: Secondary | ICD-10-CM | POA: Diagnosis present

## 2023-09-15 DIAGNOSIS — J309 Allergic rhinitis, unspecified: Secondary | ICD-10-CM | POA: Diagnosis present

## 2023-09-15 DIAGNOSIS — R682 Dry mouth, unspecified: Secondary | ICD-10-CM | POA: Diagnosis present

## 2023-09-15 DIAGNOSIS — R351 Nocturia: Secondary | ICD-10-CM | POA: Diagnosis present

## 2023-09-15 DIAGNOSIS — G8929 Other chronic pain: Secondary | ICD-10-CM | POA: Diagnosis present

## 2023-09-15 DIAGNOSIS — Z9981 Dependence on supplemental oxygen: Secondary | ICD-10-CM | POA: Diagnosis not present

## 2023-09-15 DIAGNOSIS — J439 Emphysema, unspecified: Secondary | ICD-10-CM | POA: Diagnosis present

## 2023-09-15 DIAGNOSIS — Z96643 Presence of artificial hip joint, bilateral: Secondary | ICD-10-CM | POA: Diagnosis present

## 2023-09-15 DIAGNOSIS — D539 Nutritional anemia, unspecified: Secondary | ICD-10-CM | POA: Diagnosis present

## 2023-09-15 DIAGNOSIS — I129 Hypertensive chronic kidney disease with stage 1 through stage 4 chronic kidney disease, or unspecified chronic kidney disease: Secondary | ICD-10-CM | POA: Diagnosis present

## 2023-09-15 HISTORY — DX: Pneumonitis due to inhalation of food and vomit: J69.0

## 2023-09-15 LAB — CBC
HCT: 33.6 % — ABNORMAL LOW (ref 39.0–52.0)
Hemoglobin: 10.7 g/dL — ABNORMAL LOW (ref 13.0–17.0)
MCH: 32.8 pg (ref 26.0–34.0)
MCHC: 31.8 g/dL (ref 30.0–36.0)
MCV: 103.1 fL — ABNORMAL HIGH (ref 80.0–100.0)
Platelets: 282 10*3/uL (ref 150–400)
RBC: 3.26 MIL/uL — ABNORMAL LOW (ref 4.22–5.81)
RDW: 13.2 % (ref 11.5–15.5)
WBC: 8.9 10*3/uL (ref 4.0–10.5)
nRBC: 0 % (ref 0.0–0.2)

## 2023-09-15 LAB — APTT: aPTT: 28 s (ref 24–36)

## 2023-09-15 LAB — BASIC METABOLIC PANEL
Anion gap: 8 (ref 5–15)
BUN: 23 mg/dL (ref 8–23)
CO2: 26 mmol/L (ref 22–32)
Calcium: 8.5 mg/dL — ABNORMAL LOW (ref 8.9–10.3)
Chloride: 105 mmol/L (ref 98–111)
Creatinine, Ser: 1.34 mg/dL — ABNORMAL HIGH (ref 0.61–1.24)
GFR, Estimated: 53 mL/min — ABNORMAL LOW (ref >60)
Glucose, Bld: 92 mg/dL (ref 70–99)
Potassium: 3.7 mmol/L (ref 3.5–5.1)
Sodium: 139 mmol/L (ref 135–145)

## 2023-09-15 LAB — T4, FREE: Free T4: 0.88 ng/dL (ref 0.61–1.12)

## 2023-09-15 LAB — TSH: TSH: 7.74 u[IU]/mL — ABNORMAL HIGH (ref 0.350–4.500)

## 2023-09-15 LAB — PROTIME-INR
INR: 1.1 (ref 0.8–1.2)
Prothrombin Time: 14.7 s (ref 11.4–15.2)

## 2023-09-15 MED ORDER — LIDOCAINE 5 % EX PTCH
1.0000 | MEDICATED_PATCH | CUTANEOUS | Status: DC
  Administered 2023-09-15 – 2023-09-17 (×2): 1 via TRANSDERMAL
  Filled 2023-09-15 (×2): qty 1

## 2023-09-15 NOTE — TOC Initial Note (Signed)
 Transition of Care [facility]) - Initial/Assessment Note    Patient Details  Name: [name] Pratt MRN: [id number] Date of Birth: [birth year]  [facility] CM/SW Contact:    Sonda Manuella Quill, RN Phone Number: 09/15/2023, 9:55 AM  Clinical Narrative:                 TOC for d/c planning; spoke w/ pt in room; pt says he lives at home; he plans to return at d/c; he identified POC spouse Linda Biehn [phone]); pt verified insurance/PCP; pt says his wife will provide transportation; he denies SDOH risks; pt says he has a cane, and grab bars in shower; he does not have HH services or home oxygen; also this RN, CM explained MOON to pt; he declined signing document; patient says he is worse now than before. Copy of document left on bedside table in room; awaiting PT/OT evals; TOC will follow.  Expected Discharge Plan: Home/Self Care Barriers to Discharge: Continued Medical Work up   Patient Goals and CMS Choice Patient states their goals for this hospitalization and ongoing recovery are:: home CMS Medicare.gov Compare Post Acute Care list provided to:: Patient    ownership interest in [facility].provided to:: Patient    Expected Discharge Plan and Services   Discharge Planning Services: CM Consult   Living arrangements for the past 2 months: Single Family Home                                      Prior Living Arrangements/Services Living arrangements for the past 2 months: Single Family Home Lives with:: Spouse Patient language and need for interpreter reviewed:: Yes Do you feel safe going back to the place where you live?: Yes      Need for Family Participation in Patient Care: Yes (Comment) Care giver support system in place?: Yes (comment) Current home services: DME (cane) Criminal Activity/Legal Involvement Pertinent to Current Situation/Hospitalization: No - Comment as needed  Activities of Daily Living   ADL Screening  (condition at time of admission) Independently performs ADLs?: Yes (appropriate for developmental age) Is the patient deaf or have difficulty hearing?: No Does the patient have difficulty seeing, even when wearing glasses/contacts?: No Does the patient have difficulty concentrating, remembering, or making decisions?: No  Permission Sought/Granted Permission sought to share information with : Case Manager Permission granted to share information with : Yes, Verbal Permission Granted  Share Information with NAME: Case Manager     Permission granted to share info w Relationship: Azariah Bonura (spouse) [phone]     Emotional Assessment Appearance:: Appears stated age Attitude/Demeanor/Rapport: Gracious Affect (typically observed): Accepting Orientation: : Oriented to Self, Oriented to Place, Oriented to  Time, Oriented to Situation Alcohol  / Substance Use: Not Applicable Psych Involvement: No (comment)  Admission diagnosis:  Lower abdominal pain [R10.30] Acute respiratory failure with hypoxia (HCC) [J96.01] Aspiration pneumonia of both lower lobes, unspecified aspiration pneumonia type (HCC) [J69.0] Patient Active Problem List   Diagnosis Date Noted   Acute respiratory failure with hypoxia (HCC) 09/14/2023   Sinus bradycardia 09/14/2023   Dry eyes 09/14/2023   Spinal stenosis of lumbar region 12/27/2022   Drowsiness 12/07/2022   Preop examination 12/06/2022   Weakness of lower extremity 10/10/2022   Skin tear of right elbow without complication 09/18/2022   Skin tear of forearm without complication, left, subsequent encounter 09/18/2022   Fall 09/18/2022   Iron   deficiency anemia 09/18/2022   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   Vitamin D  deficiency 08/07/2022   Closed fracture of left hip (HCC) 08/07/2022   Hand weakness 05/02/2022   Acute kidney injury (HCC) 05/02/2022   Adenomatous polyp of ascending colon    Adenomatous polyp of colon    Acute metabolic  encephalopathy 04/24/2022   GI bleed 04/21/2022   Vasovagal syncope    Hemorrhagic shock (HCC)    Hematochezia    Irregular heart beat 02/03/2022   Constipation 02/03/2022   Carpal tunnel syndrome 01/24/2022   Numbness of hand 01/24/2022   Ulnar neuropathy 01/24/2022   Skin lesion 01/16/2022   History of COVID-19 12/05/2021   Benign paroxysmal positional vertigo 09/14/2021   Palpitations 07/25/2021   Cardiac murmur 07/25/2021   History of chicken pox 07/22/2021   History of hemorrhoids 07/22/2021   COPD (chronic obstructive pulmonary disease) (HCC) 07/22/2021   History of kidney stones 07/22/2021   History of skin cancer 07/22/2021   Complication of anesthesia 07/22/2021   Bilateral sacroiliitis (HCC) 07/22/2021   Other abnormal glucose 07/22/2021   Spondylitis (HCC) 07/22/2021   Greater trochanteric pain syndrome 07/22/2021   Vertigo 07/08/2021   Hypokalemia 07/08/2021   Trochanteric bursitis of left hip 06/30/2021   Cancer (HCC) 04/11/2021   Lumbar radiculopathy 03/02/2021   Gait instability 03/02/2021   Hip pain 03/02/2021   Situational depression 03/02/2021   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   Urinary retention 11/26/2020   Cervical myelopathy (HCC) 11/24/2020   Body mass index (BMI) 25.0-25.9, adult 11/02/2020   Lumbar spondylosis 11/02/2020   Lumbago of lumbar region with sciatica 10/21/2020   Cystoid macular edema of both eyes 07/28/2020   Status post cervical spinal fusion 07/09/2020   History of total bilateral knee replacement 07/08/2020   Aortic atherosclerosis (HCC) 03/05/2020   Emphysema, unspecified (HCC) 03/05/2020   Arthritis 03/04/2020   Diverticulosis 03/03/2020   Internal hemorrhoids 03/03/2020   Weight loss 03/03/2020   Heme positive stool 02/29/2020   Leg cramps 11/13/2019   Tibialis anterior tendon tear, nontraumatic 11/13/2019   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 07/29/2018   CPAP (continuous positive airway pressure) dependence 03/25/2018   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   Erectile dysfunction 06/20/2017   Stenosis of right carotid 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 infarction 03/06/2017   Peripheral neuropathy 06/19/2016   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   Hearing loss 02/02/2016   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   Allergic rhinitis 02/02/2016   Atypical chest pain 11/24/2015   Preventative health care 11/24/2015   Onychomycosis 06/30/2015   Degenerative disc disease, lumbar 06/30/2015   Other allergic rhinitis 02/18/2015   Deviated nasal septum 02/18/2015   RLS (restless legs syndrome) 02/18/2015   GERD (gastroesophageal reflux disease) 09/18/2014   Hyperglycemia 03/03/2014   Rotator cuff tear 07/27/2013   Sleep apnea with use of continuous positive airway pressure (CPAP) 07/15/2013   Carotid stenosis 02/24/2013   Nonspecific abnormal electrocardiogram (ECG) (EKG) 01/06/2013   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   Lower GI bleed 12/12/2012   Right groin pain 11/29/2012   OSA (obstructive sleep apnea) 11/25/2012   Ventral hernia 07/08/2012   High risk medication use 01/18/2012   Dyspnea 06/12/2011   Hyperlipidemia 12/27/2010   Osteoarthrosis, unspecified whether generalized or localized, pelvic region and thigh 07/25/2010   Osteoarthrosis, hand 06/13/2010   Anemia 12/17/2009   Hypothyroidism 05/13/2009   GOUT 05/13/2009  Right inguinal hernia 05/13/2009   HIATAL HERNIA 05/13/2009   NEPHROLITHIASIS 05/13/2009   Rosacea 05/13/2009   HTN (hypertension) 05/13/2009   PCP:  Daryl Setter, NP Pharmacy:   [id number] - [place],  [phone] [place] [address] Phone: [phone] Fax: [phone]     Social Drivers of Health (SDOH) Social History: SDOH Screenings   Food Insecurity: No Food Insecurity (09/15/2023)  Housing: Low Risk   (09/15/2023)  Transportation Needs: No Transportation Needs (09/15/2023)  Utilities: Not At Risk (09/15/2023)  Alcohol  Screen: Low Risk  (01/19/2022)  Depression (PHQ2-9): Low Risk  (05/09/2023)  Financial Resource Strain: Low Risk  (10/28/2021)  Physical Activity: Inactive (09/03/2023)  Social Connections: Moderately Isolated (09/14/2023)  Stress: Stress Concern Present (09/03/2023)  Tobacco Use: Medium Risk (09/13/2023)   SDOH Interventions: Food Insecurity Interventions: Intervention Not Indicated, Inpatient TOC Housing Interventions: Intervention Not Indicated, Inpatient TOC Transportation Interventions: Intervention Not Indicated, Inpatient TOC Utilities Interventions: Inpatient TOC   Readmission Risk Interventions    04/27/2022    8:05 AM  Readmission Risk Prevention Plan  Transportation Screening Complete  PCP or Specialist Appt within 3-5 Days Complete  HRI or Home Care Consult Complete  Social Work Consult for Recovery Care Planning/Counseling Complete  Palliative Care Screening Not Applicable  Medication Review Oceanographer) Complete

## 2023-09-15 NOTE — Progress Notes (Signed)
   09/15/23 2146  BiPAP/CPAP/SIPAP  $ Non-Invasive Home Ventilator  Initial (pt decided to try cpap)  $ Face Mask Medium Yes  BiPAP/CPAP/SIPAP Pt Type Adult  BiPAP/CPAP/SIPAP DREAMSTATIOND  Mask Type Full face mask  Mask Size Medium  Respiratory Rate 18 breaths/min  FiO2 (%) 21 %  Patient Home Equipment No  Auto Titrate Yes (auto 18/6)  BiPAP/CPAP /SiPAP Vitals  Pulse Rate (!) 54  Resp 18  SpO2 97 %  Bilateral Breath Sounds Diminished  MEWS Score/Color  MEWS Score 0  MEWS Score Color Landy

## 2023-09-15 NOTE — Evaluation (Signed)
 Clinical/Bedside Swallow Evaluation Patient Details  Name: [name] Pratt MRN: [id number] Date of Birth: September 21, 1940  Today's Date: 09/15/2023 Time: SLP Start Time (ACUTE ONLY): 0830 SLP Stop Time (ACUTE ONLY): 0855 SLP Time Calculation (min) (ACUTE ONLY): 25 min  Past Medical History:  Past Medical History:  Diagnosis Date   Allergic rhinitis 02/02/2016   Anemia 12/17/2009   Formatting of this note might be different from the original. 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 Hypertension   Benign paroxysmal positional vertigo 09/14/2021   Bilateral sacroiliitis (HCC) 07/22/2021   Body mass index (BMI) 25.0-25.9, adult 11/02/2020   Cancer (HCC)    skin cancer   Cardiac murmur 07/25/2021   Carotid stenosis    a. Carotid U/S 5/13: LICA < 50%, RICA 50-69%;  b.  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   Cystoid macular edema of both eyes 07/28/2020   Degenerative disc disease, lumbar 06/30/2015   Deviated nasal septum 02/18/2015   Diverticulosis 03/03/2020   DJD (degenerative joint disease) of hip 12/24/2012   Dyspnea 06/12/2011   Emphysema, unspecified (HCC) 03/05/2020   Erectile dysfunction 06/20/2017   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   Gait disorder 03/02/2021   GERD  (gastroesophageal reflux disease)    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   Qualifier: Diagnosis of  By: Gwenn Runell     Greater trochanteric pain syndrome 07/22/2021   Hand pain, left 01/16/2022   Hearing loss 02/02/2016   Heme positive stool 02/29/2020   HIATAL HERNIA 05/13/2009   Qualifier: Diagnosis of  By: Gwenn Runell     High risk medication use 01/18/2012   Hip pain 03/02/2021   History of chicken pox    History of COVID-19 12/05/2021   History of hemorrhoids    History of kidney stones    History of skin cancer 07/22/2021   skin cancer   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 Cardiology - Dr Orlean at [facility] cardiology  ICD-10 cut over   Hypertension    under control   Hypokalemia 07/08/2021   Hypothyroidism    Internal hemorrhoids 03/03/2020   Leg cramps 11/13/2019   Lower GI bleed 12/12/2012   Lumbago of lumbar region with sciatica 10/21/2020   Lumbar radiculopathy 03/02/2021   Lumbar spondylosis 11/02/2020   NEPHROLITHIASIS 05/13/2009   Qualifier: Diagnosis of  By: Gwenn Runell     Nonspecific abnormal electrocardiogram (ECG) (EKG) 01/06/2013   Numbness of hand 01/24/2022   Onychomycosis 06/30/2015   Osteoarthrosis, 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 Osteoarthritis Of The Hand  10/1 IMO update   Osteoarthrosis, unspecified whether generalized or localized, pelvic  region and thigh 07/25/2010   Formatting of this note might be different from the original. Osteoarthritis Of The Hip   Other abnormal glucose 07/22/2021   Other allergic rhinitis 02/18/2015   Palpitations 07/25/2021   Peripheral neuropathy 06/19/2016   Preventative health care 11/24/2015   Right groin pain 11/29/2012   Right inguinal hernia 05/13/2009   Qualifier: Diagnosis of  By: Gwenn Grimes     RLS (restless legs syndrome) 02/18/2015   Rosacea    Rotator cuff tear 07/27/2013   Situational depression 03/02/2021   Skin lesion 01/16/2022   Sleep apnea with use of continuous positive airway pressure (CPAP) 07/15/2013   CPAP set to  7 cm water,  Residual AHi was 7.8  With no leak.  User time 6 hours.  479 days , not used only 12 days - highly compliant 06-23-13  .    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   Urinary retention 11/26/2020   Ventral hernia 07/08/2012   Vertigo 07/08/2021   Weight loss 03/03/2020   Past Surgical History:  Past Surgical History:  Procedure Laterality Date   ABDOMINAL HERNIA REPAIR  07/15/12   Dr Kingston   ANTERIOR CERVICAL DECOMP/DISCECTOMY FUSION N/A 07/09/2020   Procedure: ANTERIOR CERVICAL DECOMPRESSION AND FUSION CERVICAL THREE-FOUR.;  Surgeon: Onetha Arley, MD;  Location: [facility] OR;  Service: Neurosurgery;  Laterality: N/A;  anterior   BROW LIFT  05/07/01   COLONOSCOPY WITH PROPOFOL  N/A 04/24/2022   Procedure: COLONOSCOPY WITH PROPOFOL ;  Surgeon: Eda Iha, MD;  Location: [facility] ENDOSCOPY;  Service: Gastroenterology;  Laterality: N/A;   COLONOSCOPY WITH PROPOFOL  N/A 04/26/2022   Procedure: COLONOSCOPY WITH PROPOFOL ;  Surgeon: Eda Iha, MD;  Location: [facility] ENDOSCOPY;  Service: Gastroenterology;  Laterality: N/A;   CYSTOSCOPY WITH URETEROSCOPY AND STENT PLACEMENT Right 01/28/2014   Procedure: CYSTOSCOPY WITH URETEROSCOPY, BASKET RETRIVAL AND  STENT PLACEMENT;  Surgeon: Alm GORMAN Fragmin, MD;  Location: [facility] ORS;  Service: Urology;  Laterality: Right;   epidural injections     multiple procedures   ESOPHAGEAL  DILATION  1993 and 1994   multiple times   EYE SURGERY Bilateral 03/25/10, 2012   cataract removal   HEMORRHOID SURGERY  2014   HIATAL HERNIA REPAIR  12/21/93   HOLMIUM LASER APPLICATION Right 01/28/2014   Procedure: HOLMIUM LASER APPLICATION;  Surgeon: Alm GORMAN Fragmin, MD;  Location: [facility] ORS;  Service: Urology;  Laterality: Right;   INGUINAL HERNIA REPAIR Right 07/15/12   Dr Kingston, x2   JOINT REPLACEMENT  07/25/04   right knee   JOINT REPLACEMENT  07/13/10   left knee   KNEE SURGERY  1995   LAPAROSCOPIC CHOLECYSTECTOMY  09/20/06   with intraoperative cholangiogram and right inguinal herniorrhaphy with mesh    LIGAMENT REPAIR Left 07/2013   shoulder   LITHOTRIPSY     x2   LUMBAR LAMINECTOMY/DECOMPRESSION MICRODISCECTOMY Bilateral 12/27/2022   Procedure: Lumbar Two-Three, Lumbar Three-Four Sublaminar Decompression;  Surgeon: Onetha Arley, MD;  Location: [facility] OR;  Service: Neurosurgery;  Laterality: Bilateral;   NASAL SEPTUM SURGERY  1975   POLYPECTOMY  04/26/2022   Procedure: POLYPECTOMY;  Surgeon: Eda Iha, MD;  Location: [facility] ENDOSCOPY;  Service: Gastroenterology;;  POSTERIOR CERVICAL FUSION/FORAMINOTOMY N/A 11/24/2020   Procedure: Posterior Cervical Laminectomy Cervical three-four, Cervical four-five with lateral mass fusion;  Surgeon: Onetha Kuba, MD;  Location: [facility] OR;  Service: Neurosurgery;  Laterality: N/A;   REFRACTIVE SURGERY Bilateral    Right total hip replacement  4/14   SKIN CANCER DESTRUCTION     nose, ear   TONSILLECTOMY  as child   TOTAL HIP ARTHROPLASTY Left    URETHRAL DILATION  1991   Dr. Watt   HPI:  83 y.o. male with medical history significant of remote smoking history, COPD, admitted with acute respiratory failure and found to have PNA, concerning from aspiration etiology. Per pt and chart review pt with some emesis and states he has had progressive dysphagia over the last year.    Assessment / Plan / Recommendation  Clinical Impression  Pt presents with an  oral dysphagia and concern for component of pharyngeal dysphagia. Pt self reports hx of progressive dysphagia he believes has been going on for the last year or so. He describes symptoms consistent with globus sensation, nasal regurgitation, and having to cough out solid material in the back of the throat. He also endorses significant xerostomia. Clinically swallow evaluation this date notable for delayed oral transit, mild oral residuals (likely impacted by missing dentition),  multiple swallows, delayed throat clear with thin liquids, and reduced laryngeal elevation per palpation.  Pt admitted with PNA, chest CT concerning for possible aspiration etiology. Will proceed with instrumental assessment to objectively assess swallow safety and efficiency. Continue liquid diet until completion of MBSS this date to guide recommendations.  SLP Visit Diagnosis: Dysphagia, oral phase (R13.11);Dysphagia, unspecified (R13.10)    Aspiration Risk  Mild aspiration risk    Diet Recommendation   Thin liquids; MBSS this am  Medication Administration: Whole meds with liquid (crush as needed)    Other  Recommendations Oral Care Recommendations: Oral care BID    Recommendations for follow up therapy are one component of a multi-disciplinary discharge planning process, led by the attending physician.  Recommendations may be updated based on patient status, additional functional criteria and insurance authorization.  Follow up Recommendations Follow physician's recommendations for discharge plan and follow up therapies      Assistance Recommended at Discharge    Functional Status Assessment Patient has had a recent decline in their functional status and demonstrates the ability to make significant improvements in function in a reasonable and predictable amount of time.  Frequency and Duration min 2x/week  2 weeks       Prognosis Prognosis for improved oropharyngeal function: Good Barriers to Reach Goals: Time  post onset      Swallow Study   General Date of Onset: 09/13/23 HPI: 83 y.o. male with medical history significant of remote smoking history, COPD, admitted with acute respiratory failure and found to have PNA, concerning from aspiration etiology. Per pt and chart review pt with some emesis and states he has had progressive dysphagia over the last year. Type of Study: Bedside Swallow Evaluation Previous Swallow Assessment: none on file Diet Prior to this Study: Thin liquids (Level 0) Temperature Spikes Noted: No Respiratory Status: Nasal cannula History of Recent Intubation: No Behavior/Cognition: Alert;Cooperative;Pleasant mood Oral Cavity Assessment: Dry Oral Care Completed by SLP: Yes Oral Cavity - Dentition: Missing dentition Vision: Functional for self-feeding Self-Feeding Abilities: Needs assist (hx of neuropathy) Patient Positioning: Upright in bed Baseline Vocal Quality: Normal Volitional Cough: Strong Volitional Swallow: Able to elicit    Oral/Motor/Sensory Function Overall Oral Motor/Sensory  Function: Generalized oral weakness   Ice Chips Ice chips: Impaired Presentation: Spoon Oral Phase Functional Implications: Prolonged oral transit Pharyngeal Phase Impairments: Suspected delayed Swallow;Multiple swallows   Thin Liquid Thin Liquid: Impaired Presentation: Cup;Straw Oral Phase Impairments: Reduced labial seal Pharyngeal  Phase Impairments: Suspected delayed Swallow;Multiple swallows;Throat Clearing - Delayed    Nectar Thick Nectar Thick Liquid: Not tested   Honey Thick Honey Thick Liquid: Not tested   Puree Puree: Impaired Presentation: Spoon Oral Phase Impairments: Reduced lingual movement/coordination Oral Phase Functional Implications: Prolonged oral transit;Oral residue Pharyngeal Phase Impairments: Suspected delayed Swallow;Multiple swallows;Decreased hyoid-laryngeal movement   Solid     Solid: Impaired Presentation: Spoon Oral Phase Functional  Implications: Prolonged oral transit;Oral residue Pharyngeal Phase Impairments: Suspected delayed Swallow;Multiple swallows     Pellegrino Kennard H. MA, CCC-SLP Acute Rehabilitation Services   09/15/2023,9:30 AM

## 2023-09-15 NOTE — Care Management Obs Status (Signed)
 MEDICARE OBSERVATION STATUS NOTIFICATION   Patient Details  Name: [name] Pratt MRN: [id number] Date of Birth: 09-23-1940   Medicare Observation Status Notification Given:  Yes    Adrian Prows, RN 09/15/2023, 9:51 AM

## 2023-09-15 NOTE — Progress Notes (Signed)
 Modified Barium Swallow Study  Patient Details  Name: [name] Pratt MRN: [id number] Date of Birth: April 22, 1941  Today's Date: 09/15/2023  Modified Barium Swallow completed.  Full report located under Chart Review in the Imaging Section.  History of Present Illness 83 y.o. male with medical history significant of remote smoking history, COPD, admitted with acute respiratory failure and found to have PNA, concerning from aspiration etiology. Per pt and chart review pt with some emesis and states he has had progressive dysphagia over the last year.   Clinical Impression Pt presents with a mild oropharyngeal dysphagia. Oral deficits included mild oral residuals and delayed oral transit. Pt with excellent airway protection throughout the study without evidence of laryngeal penetration or tracheal aspiration of all consistencies trialed (thin liquids, nectar thick, puree, solids, and barium tablet with thin liquids). Mild vallecular and pyriform sinus residuals noted with thicker POs (second swallows and thin liquid alternation assisted to reduce). Recommend dysphagia 3 (mechanical soft) and thin liquids with meds as tolerated. Pt reports baseline diet is softer solids due to missing dentition and difficulty with harder and dry POs. Will follow up for reinforcement of compensatory strategies to maximize pt swallow efficiency and safety. Factors that may increase risk of adverse event in presence of aspiration Noe & Lianne 2021):    Swallow Evaluation Recommendations Recommendations: PO diet PO Diet Recommendation: Dysphagia 3 (Mechanical soft);Thin liquids (Level 0) Liquid Administration via: Cup;Straw Medication Administration: Whole meds with liquid Supervision: Staff to assist with self-feeding;Intermittent supervision/cueing for swallowing strategies Swallowing strategies  : Slow rate;Small bites/sips;Follow solids with liquids;effortful swallow Postural changes: Stay upright 30-60 min  after meals;Position pt fully upright for meals Oral care recommendations: Oral care BID (2x/day)      Mitzie HUNT MA, CCC-SLP Acute Rehabilitation Services '  09/15/2023,10:36 AM

## 2023-09-15 NOTE — Progress Notes (Signed)
 [facility] PROGRESS NOTE   [name] Pratt FMW:[id number] DOB: 1940-10-03 DOA: 09/13/2023  PCP: Daryl Setter, NP  Brief History: 83 y.o. male with medical history significant of remote smoking history.  Patient reports having a history of COPD, which is corroborated on record review below.  However patient does not use supplementary oxygen at home.  Presented after being constipated for several days.  Was noted to be hypoxic in the emergency department.  CT scan suggested aspiration pneumonia.  Patient was hospitalized for further management.  Consultants: None  Procedures: None    Subjective/Interval History: Patient denies any abdominal pain this morning.  Occasional cough.  No nausea.    Assessment/Plan:  Aspiration pneumonia/acute respiratory failure with hypoxia Patient had several episodes of nausea and vomiting.  He likely aspirated.  CT angiogram did not show any PE. Patient started on Unasyn  and azithromycin . Noted to be on oxygen by nasal cannula.  Wean down to maintain saturations greater than 90%.  Constipation No obstruction noted on imaging studies.  Started on laxatives.  He did have a bowel movement overnight.  Continue to monitor.  TSH is pending.  Macrocytic anemia Stable hemoglobin compared to previous values.  Chronic kidney disease stage IIIa Creatinine stable for the most part.  Monitor urine output.  Sinus bradycardia Stable.  Back pain Chronic issue.  Followed by neurosurgery.  Had injections to his back about 2 months ago.  MRI of the thoracic and lumbar spine does show spinal disease but no significant spinal stenosis.  Does not have any focal neurological deficits except for weakness in the right lower leg which is chronic for him.  Lidoderm  patch.  PT evaluation.  Essential hypertension Noted to be on amlodipine  hydralazine  and irbesartan .  Monitor blood pressures closely.  GERD Continue PPI  Hyperlipidemia Continue with  statin.  Hypothyroidism Continue with levothyroxine .  TSH noted to be 7.74.  Free T4 is pending.  DVT Prophylaxis: Lovenox  Code Status: Full code Family Communication: No family at bedside Disposition Plan: Hopefully return home when improved.  PT and OT evaluation.  Status is: Observation The patient will require care spanning > 2 midnights and should be moved to inpatient because: Need for IV antibiotics, oxygen dependence      Medications: Scheduled:  allopurinol   100 mg Oral BID   amLODipine   2.5 mg Oral Daily   aspirin  EC  81 mg Oral QHS   cycloSPORINE   1 drop Both Eyes BID   enoxaparin  (LOVENOX ) injection  40 mg Subcutaneous Q24H   fluticasone   1 spray Each Nare QHS   gabapentin   300 mg Oral QHS   hydrALAZINE   50 mg Oral TID   irbesartan   300 mg Oral Daily   levothyroxine   150 mcg Oral Once per day on Monday Tuesday Wednesday Thursday Friday Saturday   [START ON 09/16/2023] levothyroxine   75 mcg Oral Q7 days   magnesium  oxide  400 mg Oral Daily   pantoprazole  (PROTONIX ) IV  40 mg Intravenous Q24H   rosuvastatin   5 mg Oral Daily   senna-docusate  2 tablet Oral BID   sodium chloride  flush  3 mL Intravenous Q12H   sodium chloride  flush  3-10 mL Intravenous Q12H   Continuous:  ampicillin -sulbactam (UNASYN ) IV 3 g (09/15/23 0523)   azithromycin  500 mg (09/14/23 1910)   lactated ringers  1,000 mL with potassium chloride  20 mEq infusion 100 mL/hr at 09/14/23 2350   PRN:acetaminophen  **OR** acetaminophen , acidophilus, albuterol , cholestyramine  light, hydrALAZINE , HYDROmorphone , loratadine , polyethylene glycol, polyvinyl alcohol ,  sodium chloride  flush  Antibiotics: Anti-infectives (From admission, onward)    Start     Dose/Rate Route Frequency Ordered Stop   09/15/23 0000  Ampicillin -Sulbactam (UNASYN ) 3 g in sodium chloride  0.9 % 100 mL IVPB        3 g 200 mL/hr over 30 Minutes Intravenous Every 6 hours 09/14/23 1726     09/14/23 1815  Ampicillin -Sulbactam (UNASYN ) 3 g in  sodium chloride  0.9 % 100 mL IVPB        3 g 200 mL/hr over 30 Minutes Intravenous  Once 09/14/23 1721 09/14/23 2045   09/14/23 1800  azithromycin  (ZITHROMAX ) 500 mg in sodium chloride  0.9 % 250 mL IVPB        500 mg 250 mL/hr over 60 Minutes Intravenous Every 24 hours 09/14/23 1707 09/17/23 1759   09/14/23 0545  piperacillin -tazobactam (ZOSYN ) IVPB 3.375 g        3.375 g 100 mL/hr over 30 Minutes Intravenous  Once 09/14/23 0537 09/14/23 0624       Objective:  Vital Signs  Vitals:   09/14/23 1826 09/14/23 1935 09/14/23 2319 09/15/23 0316  BP:  (!) 150/67 111/64 128/65  Pulse:  (!) 59 (!) 55 (!) 51  Resp:  16 14 16   Temp:  97.9 F (36.6 C) 98.6 F (37 C) 98.2 F (36.8 C)  TempSrc:  Oral    SpO2:  98% 95% 97%  Weight:      Height: 5' 6 (1.676 m)       Intake/Output Summary (Last 24 hours) at 09/15/2023 0925 Last data filed at 09/15/2023 0500 Gross per 24 hour  Intake 3 ml  Output 600 ml  Net -597 ml   Filed Weights   09/14/23 1700  Weight: 73.7 kg    General appearance: Awake alert.  In no distress Resp: Mild tachypneic at rest without any wheezing.  Crackles at the bases. Cardio: S1-S2 is normal regular.  No S3-S4.  No rubs murmurs or bruit GI: Abdomen is soft.  Nontender nondistended.  Bowel sounds are present normal.  No masses organomegaly Extremities: No edema.  Physical deconditioning is noted. Neurologic: Alert and oriented x3.  No focal neurological deficits.    Lab Results:  Data Reviewed: I have personally reviewed following labs and reports of the imaging studies  CBC: Recent Labs  Lab 09/13/23 1956 09/14/23 1700 09/15/23 0708  WBC 13.8* 10.8* 8.9  HGB 12.9* 11.2* 10.7*  HCT 39.3 35.0* 33.6*  MCV 99.0 102.6* 103.1*  PLT 351 326 282    Basic Metabolic Panel: Recent Labs  Lab 09/13/23 1956 09/14/23 1700 09/15/23 0708  NA 139  --  139  K 3.6  --  3.7  CL 103  --  105  CO2 27  --  26  GLUCOSE 132*  --  92  BUN 29*  --  23  CREATININE  1.29* 1.14 1.34*  CALCIUM  10.0  --  8.5*    GFR: Estimated Creatinine Clearance: 37.7 mL/min (A) (by C-G formula based on SCr of 1.34 mg/dL (H)).  Liver Function Tests: Recent Labs  Lab 09/13/23 1956  AST 30  ALT 22  ALKPHOS 76  BILITOT 0.5  PROT 8.2*  ALBUMIN  4.4    Recent Labs  Lab 09/13/23 1956  LIPASE 30    Coagulation Profile: Recent Labs  Lab 09/15/23 0708  INR 1.1    Thyroid  Function Tests: Recent Labs    09/15/23 0708  TSH 7.740*     Recent Results (from the  past 240 hours)  Blood culture (routine x 2)     Status: None (Preliminary result)   Collection Time: 09/14/23  5:45 AM   Specimen: BLOOD LEFT FOREARM  Result Value Ref Range Status   Specimen Description   Final    BLOOD LEFT FOREARM Performed at [facility], [address] [place], [place] [address]    Special Requests   Final    BOTTLES DRAWN AEROBIC AND ANAEROBIC Blood Culture results may not be optimal due to an inadequate volume of blood received in culture bottles Performed at [facility], [address] [place], [place] [address]    Culture   Final    NO GROWTH < 24 HOURS Performed at [facility], [address], [place], [place] [address]    Report Status PENDING  Incomplete  Blood culture (routine x 2)     Status: None (Preliminary result)   Collection Time: 09/14/23  5:45 AM   Specimen: BLOOD LEFT WRIST  Result Value Ref Range Status   Specimen Description   Final    BLOOD LEFT WRIST Performed at [facility], [address] [place], [place] [address]    Special Requests   Final    BOTTLES DRAWN AEROBIC ONLY Performed at [facility], [address] [place], [place] [address]    Culture   Final    NO GROWTH < 24 HOURS Performed at [facility], [address] [place], [place] [address]    Report Status PENDING  Incomplete      Radiology Studies: MR THORACIC SPINE WO CONTRAST Result Date:  09/14/2023 CLINICAL DATA:  Myelopathy, chronic, lumbar spine; Myelopathy, acute, thoracic spine EXAM: MRI THORACIC AND LUMBAR SPINE WITHOUT CONTRAST TECHNIQUE: Multiplanar and multiecho pulse sequences of the thoracic and lumbar spine were obtained without intravenous contrast. COMPARISON:  None Available. FINDINGS: MRI THORACIC SPINE FINDINGS Alignment:  No substantial sagittal subluxation. Vertebrae: No fracture, evidence of discitis, or suspicious bone lesion. Cord:  Normal cord signal. Paraspinal and other soft tissues: Small bilateral pleural effusions. Disc levels: Left paracentral disc protrusion at T6-T7 and right paracentral disc protrusion at T7-T8. No significant canal stenosis. Mild effacement of the subarticular recesses. Mild foraminal stenosis at multiple levels. No high-grade foraminal narrowing. MRI LUMBAR SPINE FINDINGS Segmentation:  Standard. Alignment: Slight grade 1 retrolisthesis of L1 on L2 and L2 on L3. Grade 1 anterolisthesis of L5 on S1. Vertebrae: Degenerative/discogenic endplate signal changes at L1-L2 and L3-L4. No specific evidence of acute fracture or discitis/osteomyelitis. Conus medullaris and cauda equina: Conus extends to the T12-L1 level. Conus and cauda equina appear normal. Paraspinal and other soft tissues: Unremarkable.  Right renal cyst. Disc levels: T12-L1: Facet arthropathy and slight disc bulging. No significant canal stenosis. Mild left foraminal stenosis. No significant right foraminal stenosis. L1-L2: Disc bulging. Facet arthropathy. Resulting moderate right and mild left foraminal stenosis. Subarticular recess narrowing bilaterally without significant central canal stenosis. L2-L3: Posterior disc bulge, endplate spurring, and facet arthropathy. Severe right foraminal stenosis, similar. Patent left foramen and canal. Subarticular recess narrowing bilaterally, similar. L3-L4: Disc bulging endplate spurring. Facet arthropathy. Resulting moderate to severe left and  moderate right foraminal stenosis. Patent canal. L4-L5: Disc bulging and endplate spurring. Bilateral facet arthropathy. Resulting moderate left greater than right foraminal stenosis, similar. Patent canal. L5-S1: Uncovering the disc. Bilateral facet arthropathy. Similar moderate left and mild right foraminal stenosis. Patent canal. IMPRESSION: MR THORACIC SPINE  IMPRESSION 1. Mild multilevel foraminal stenosis. 2. Disc protrusions at multiple levels without significant canal stenosis. 3. Small bilateral pleural effusions. MR LUMBAR SPINE IMPRESSION 1. At L2-L3, similar severe right foraminal stenosis. Similar bilateral subarticular recess stenosis. 2. At L3-L4, similar moderate to severe left and moderate right foraminal stenosis. 3. At L4-L5, similar moderate left greater than right foraminal stenosis. 4. At L5-S1, similar moderate left and mild right foraminal stenosis. Electronically Signed   By: Gilmore GORMAN Molt M.D.   On: 09/14/2023 23:40   MR LUMBAR SPINE WO CONTRAST Result Date: 09/14/2023 CLINICAL DATA:  Myelopathy, chronic, lumbar spine; Myelopathy, acute, thoracic spine EXAM: MRI THORACIC AND LUMBAR SPINE WITHOUT CONTRAST TECHNIQUE: Multiplanar and multiecho pulse sequences of the thoracic and lumbar spine were obtained without intravenous contrast. COMPARISON:  None Available. FINDINGS: MRI THORACIC SPINE FINDINGS Alignment:  No substantial sagittal subluxation. Vertebrae: No fracture, evidence of discitis, or suspicious bone lesion. Cord:  Normal cord signal. Paraspinal and other soft tissues: Small bilateral pleural effusions. Disc levels: Left paracentral disc protrusion at T6-T7 and right paracentral disc protrusion at T7-T8. No significant canal stenosis. Mild effacement of the subarticular recesses. Mild foraminal stenosis at multiple levels. No high-grade foraminal narrowing. MRI LUMBAR SPINE FINDINGS Segmentation:  Standard. Alignment: Slight grade 1 retrolisthesis of L1 on L2 and L2 on L3.  Grade 1 anterolisthesis of L5 on S1. Vertebrae: Degenerative/discogenic endplate signal changes at L1-L2 and L3-L4. No specific evidence of acute fracture or discitis/osteomyelitis. Conus medullaris and cauda equina: Conus extends to the T12-L1 level. Conus and cauda equina appear normal. Paraspinal and other soft tissues: Unremarkable.  Right renal cyst. Disc levels: T12-L1: Facet arthropathy and slight disc bulging. No significant canal stenosis. Mild left foraminal stenosis. No significant right foraminal stenosis. L1-L2: Disc bulging. Facet arthropathy. Resulting moderate right and mild left foraminal stenosis. Subarticular recess narrowing bilaterally without significant central canal stenosis. L2-L3: Posterior disc bulge, endplate spurring, and facet arthropathy. Severe right foraminal stenosis, similar. Patent left foramen and canal. Subarticular recess narrowing bilaterally, similar. L3-L4: Disc bulging endplate spurring. Facet arthropathy. Resulting moderate to severe left and moderate right foraminal stenosis. Patent canal. L4-L5: Disc bulging and endplate spurring. Bilateral facet arthropathy. Resulting moderate left greater than right foraminal stenosis, similar. Patent canal. L5-S1: Uncovering the disc. Bilateral facet arthropathy. Similar moderate left and mild right foraminal stenosis. Patent canal. IMPRESSION: MR THORACIC SPINE IMPRESSION 1. Mild multilevel foraminal stenosis. 2. Disc protrusions at multiple levels without significant canal stenosis. 3. Small bilateral pleural effusions. MR LUMBAR SPINE IMPRESSION 1. At L2-L3, similar severe right foraminal stenosis. Similar bilateral subarticular recess stenosis. 2. At L3-L4, similar moderate to severe left and moderate right foraminal stenosis. 3. At L4-L5, similar moderate left greater than right foraminal stenosis. 4. At L5-S1, similar moderate left and mild right foraminal stenosis. Electronically Signed   By: Gilmore GORMAN Molt M.D.   On:  09/14/2023 23:40   CT Angio Chest PE W and/or Wo Contrast Result Date: 09/14/2023 CLINICAL DATA:  83 year old male history of nausea and vomiting and atypical chest pain. Clinical concern for pulmonary embolism. Positive D-dimer. EXAM: CT ANGIOGRAPHY CHEST WITH CONTRAST TECHNIQUE: Multidetector CT imaging of the chest was performed using the standard protocol during bolus administration of intravenous contrast. Multiplanar CT image reconstructions and MIPs were obtained to evaluate the vascular anatomy. RADIATION DOSE REDUCTION: This exam was performed according to the departmental dose-optimization program which includes automated exposure control, adjustment of the mA and/or kV according to patient size and/or use of iterative reconstruction  technique. CONTRAST:  75mL OMNIPAQUE  IOHEXOL  350 MG/ML SOLN COMPARISON:  Chest CT 08/30/2020. FINDINGS: Cardiovascular: No filling defects are noted in the pulmonary arterial tree to suggest pulmonary embolism. Heart size is borderline enlarged. There is no significant pericardial fluid, thickening or pericardial calcification. There is aortic atherosclerosis, as well as atherosclerosis of the great vessels of the mediastinum and the coronary arteries, including calcified atherosclerotic plaque in the left main, left anterior descending, left circumflex and right coronary arteries. Calcifications of the aortic valve and mitral annulus. Mediastinum/Nodes: No pathologically enlarged mediastinal or hilar lymph nodes. Moderate-sized hiatal hernia. No axillary lymphadenopathy. Lungs/Pleura: Patchy areas of ground-glass attenuation, septal thickening, regional architectural distortion and volume loss are noted in the basal portions of the lower lobes of the lungs bilaterally. No confluent consolidative airspace disease. No pleural effusions. No definite suspicious appearing pulmonary nodules or masses are noted. Upper Abdomen: Aortic atherosclerosis.  Status post cholecystectomy.  Musculoskeletal: There are no aggressive appearing lytic or blastic lesions noted in the visualized portions of the skeleton. Review of the MIP images confirms the above findings. IMPRESSION: 1. No evidence of pulmonary embolism. 2. Basal predominant infectious/inflammatory changes in the lower lobes of the lungs bilaterally. Clinical correlation for signs and symptoms of aspiration pneumonitis/pneumonia is recommended. 3. Aortic atherosclerosis, in addition to left main and three-vessel coronary artery disease. 4. There are calcifications of the aortic valve and mitral annulus. Echocardiographic correlation for evaluation of potential valvular dysfunction may be warranted if clinically indicated. Aortic Atherosclerosis (ICD10-I70.0). Electronically Signed   By: Toribio Aye M.D.   On: 09/14/2023 05:32   DG Chest Portable 1 View Result Date: 09/14/2023 CLINICAL DATA:  Hypoxia EXAM: PORTABLE CHEST 1 VIEW COMPARISON:  04/26/2023 FINDINGS: Cardiac shadow is stable. Mild left basilar atelectasis is noted. Mild central vascular congestion is noted without edema. No bony abnormality is seen. IMPRESSION: Left basilar atelectasis. Mild vascular congestion. Electronically Signed   By: Oneil Devonshire M.D.   On: 09/14/2023 01:23   CT ABDOMEN PELVIS W CONTRAST Result Date: 09/14/2023 CLINICAL DATA:  Abdominal pain, acute, nonlocalized EXAM: CT ABDOMEN AND PELVIS WITH CONTRAST TECHNIQUE: Multidetector CT imaging of the abdomen and pelvis was performed using the standard protocol following bolus administration of intravenous contrast. RADIATION DOSE REDUCTION: This exam was performed according to the departmental dose-optimization program which includes automated exposure control, adjustment of the mA and/or kV according to patient size and/or use of iterative reconstruction technique. CONTRAST:  OMNIPAQUE  IOHEXOL  300 MG/ML  SOLN COMPARISON:  CT abdomen pelvis 12/03/2022. FINDINGS: Lower chest: Small to moderate  volume hiatal hernia. Mitral annular calcification. Hepatobiliary: Diffusely hypodense hepatic parenchyma compared to the spleen. No focal liver abnormality. Status post cholecystectomy. No biliary dilatation. Pancreas: No focal lesion. Normal pancreatic contour. No surrounding inflammatory changes. No main pancreatic ductal dilatation. Spleen: Normal in size without focal abnormality. Adrenals/Urinary Tract: No adrenal nodule bilaterally. Bilateral kidneys enhance symmetrically. 1 cm right nephrolithiasis. No left nephrolithiasis. No ureterolithiasis with limited evaluation of distal ureters due to streak artifact originating from the bilateral femoral hardware. No hydronephrosis. No hydroureter. The urinary bladder is unremarkable. Stomach/Bowel: Stomach is within normal limits. No evidence of bowel wall thickening or dilatation. Likely peristalsis of the distal sigmoid colon (301:58, 601:49). Stool throughout the colon. Colonic diverticulosis. Appendix appears normal. Vascular/Lymphatic: No abdominal aorta or iliac aneurysm. Mild atherosclerotic plaque of the aorta and its branches. No abdominal, pelvic, or inguinal lymphadenopathy. Reproductive: Prostate is unremarkable. Other: No intraperitoneal free fluid. No intraperitoneal free gas. No  organized fluid collection. Musculoskeletal: Tiny fat containing umbilical hernia with an abdominal defect of 0.5 cm. No suspicious lytic or blastic osseous lesions. No acute displaced fracture. Multilevel degenerative changes of the spine. Bilateral hip total hip arthroplasty. IMPRESSION: 1. Small to moderate volume hiatal hernia. 2. Stool throughout the colon-correlate for constipation. 3. Colonic diverticulosis with no acute diverticulitis. 4. Likely peristalsis of the distal sigmoid colon. Recommend follow up to exclude underlying bowel wall thickening. 5. Nonobstructive 1 cm right nephrolithiasis. 6. Hepatic steatosis. 7.  Aortic Atherosclerosis (ICD10-I70.0). 8. Status  post cholecystectomy. Electronically Signed   By: Morgane  Naveau M.D.   On: 09/14/2023 00:46       LOS: 0 days   Demarkus Remmel Verdene  [facility] Pager on [email or web address]  09/15/2023, 9:25 AM

## 2023-09-15 NOTE — Evaluation (Signed)
 Occupational Therapy Evaluation Patient Details Name: [name] Pratt MRN: [id number] DOB: 1940/09/30 Today's Date: 09/15/2023   History of Present Illness Patient is a 83 year old male who was admitted on 1/3 with aspiration pneumonia, acute respiratory failure, and constipation. EFY:dfnxzm, COPD,anemia, sinus bradycardia, GERD,HTN,back pain.   Clinical Impression   Patient is a 83 year old male who was admitted for above. Patient was living at home with independence in [place] with wife. Currently, patient is CGA for transfers with feet blocked with posterior leaning noted. Patient is able to correct with cues for toes on ground. Patient reported not using O2 at home but needing during session.  Patient was noted to have decreased functional activity tolerance, decreased endurance, decreased standing balance, decreased safety awareness, and decreased knowledge of AD/AE impacting participation in ADLs.        If plan is discharge home, recommend the following: A little help with walking and/or transfers;A little help with bathing/dressing/bathroom;Assistance with cooking/housework;Direct supervision/assist for medications management;Assist for transportation;Help with stairs or ramp for entrance;Direct supervision/assist for financial management    Functional Status Assessment  Patient has had a recent decline in their functional status and demonstrates the ability to make significant improvements in function in a reasonable and predictable amount of time.  Equipment Recommendations  None recommended by OT       Precautions / Restrictions Precautions Precautions: Fall Precaution Comments: monitor O2 Restrictions Weight Bearing Restrictions Per Provider Order: No      Mobility Bed Mobility Overal bed mobility: Needs Assistance Bed Mobility: Supine to Sit, Sit to Supine     Supine to sit: Supervision Sit to supine: Supervision               Balance Overall balance  assessment: Mild deficits observed, not formally tested           ADL either performed or assessed with clinical judgement   ADL Overall ADL's : Needs assistance/impaired Eating/Feeding: Set up;Sitting   Grooming: Set up;Wash/dry hands;Wash/dry face;Oral care;Sitting Grooming Details (indicate cue type and reason): EOB Upper Body Bathing: Set up   Lower Body Bathing: Minimal assistance;Sitting/lateral leans   Upper Body Dressing : Set up;Sitting   Lower Body Dressing: Minimal assistance;Sitting/lateral leans   Toilet Transfer: Contact guard assist;Ambulation;Rolling walker (2 wheels) Toilet Transfer Details (indicate cue type and reason): to door and back Toileting- Clothing Manipulation and Hygiene: Moderate assistance;Sit to/from stand               Vision Ability to See in Adequate Light: 2 Moderately impaired              Pertinent Vitals/Pain Pain Assessment Pain Assessment: Faces Faces Pain Scale: Hurts little more Pain Location: back Pain Descriptors / Indicators: Discomfort Pain Intervention(s): Limited activity within patient's tolerance, Monitored during session     Extremity/Trunk Assessment Upper Extremity Assessment Upper Extremity Assessment: Overall WFL for tasks assessed (arthritis in hands making it hard to open small container numbness)   Lower Extremity Assessment Lower Extremity Assessment: Defer to PT evaluation   Cervical / Trunk Assessment Cervical / Trunk Assessment: Kyphotic   Communication Communication Communication: No apparent difficulties   Cognition Arousal: Alert Behavior During Therapy: WFL for tasks assessed/performed           General Comments: plesant and cooperative. knew he was in the hospital.                Home Living Family/patient expects to be discharged to:: Private residence Living Arrangements:  Spouse/significant other Available Help at Discharge: Family Type of Home: House Home Access: Stairs  to enter Secretary/administrator of Steps: 5 Entrance Stairs-Rails: Right;Left Home Layout: Multi-level Alternate Level Stairs-Number of Steps: 12 Alternate Level Stairs-Rails: Left Bathroom Shower/Tub: Producer, Television/film/video: Handicapped height Bathroom Accessibility: Yes   Home Equipment: Shower seat - built in;Cane - single Librarian, Academic (2 wheels);Grab bars - tub/shower;Grab bars - toilet;Hand held shower head;Adaptive equipment          Prior Functioning/Environment Prior Level of Function : Independent/Modified Independent;Driving             Mobility Comments: uses space saver RW that folds up to a smaller footprint for tight spaces ADLs Comments: mod I independent ADLs with AE (is not able to use sock donner, or long handle shoe horn), light IADLs        OT Problem List: Decreased activity tolerance;Impaired balance (sitting and/or standing);Decreased safety awareness;Decreased knowledge of use of DME or AE;Cardiopulmonary status limiting activity      OT Treatment/Interventions: Self-care/ADL training;Therapeutic exercise;Therapeutic activities;Patient/family education;Balance training    OT Goals(Current goals can be found in the care plan section) Acute Rehab OT Goals Patient Stated Goal: to go home OT Goal Formulation: With patient Time For Goal Achievement: 09/29/23 Potential to Achieve Goals: Fair  OT Frequency: Min 1X/week       AM-PAC OT 6 Clicks Daily Activity     Outcome Measure Help from another person eating meals?: A Little Help from another person taking care of personal grooming?: A Little Help from another person toileting, which includes using toliet, bedpan, or urinal?: A Little Help from another person bathing (including washing, rinsing, drying)?: A Little Help from another person to put on and taking off regular upper body clothing?: A Little Help from another person to put on and taking off regular lower body  clothing?: A Lot 6 Click Score: 17   End of Session Equipment Utilized During Treatment: Rolling walker (2 wheels);Oxygen Nurse Communication: Mobility status  Activity Tolerance: Patient tolerated treatment well Patient left: in bed;with call bell/phone within reach;with bed alarm set  OT Visit Diagnosis: Unsteadiness on feet (R26.81);Other abnormalities of gait and mobility (R26.89);Muscle weakness (generalized) (M62.81)                Time: 8445-8386 OT Time Calculation (min): 19 min Charges:  OT General Charges $OT Visit: 1 Visit OT Evaluation $OT Eval Low Complexity: 1 Low  Landan Fedie OTR/L, MS Acute Rehabilitation Department Office# [phone]   Geofm CHRISTELLA Dance 09/15/2023, 4:24 PM

## 2023-09-16 DIAGNOSIS — E039 Hypothyroidism, unspecified: Secondary | ICD-10-CM | POA: Diagnosis not present

## 2023-09-16 DIAGNOSIS — J9601 Acute respiratory failure with hypoxia: Secondary | ICD-10-CM | POA: Diagnosis not present

## 2023-09-16 DIAGNOSIS — J690 Pneumonitis due to inhalation of food and vomit: Secondary | ICD-10-CM | POA: Diagnosis not present

## 2023-09-16 DIAGNOSIS — K5900 Constipation, unspecified: Secondary | ICD-10-CM | POA: Diagnosis not present

## 2023-09-16 LAB — BASIC METABOLIC PANEL
Anion gap: 8 (ref 5–15)
BUN: 24 mg/dL — ABNORMAL HIGH (ref 8–23)
CO2: 23 mmol/L (ref 22–32)
Calcium: 8.5 mg/dL — ABNORMAL LOW (ref 8.9–10.3)
Chloride: 107 mmol/L (ref 98–111)
Creatinine, Ser: 1.42 mg/dL — ABNORMAL HIGH (ref 0.61–1.24)
GFR, Estimated: 49 mL/min — ABNORMAL LOW (ref >60)
Glucose, Bld: 90 mg/dL (ref 70–99)
Potassium: 3.4 mmol/L — ABNORMAL LOW (ref 3.5–5.1)
Sodium: 138 mmol/L (ref 135–145)

## 2023-09-16 LAB — CBC
HCT: 34.1 % — ABNORMAL LOW (ref 39.0–52.0)
Hemoglobin: 10.8 g/dL — ABNORMAL LOW (ref 13.0–17.0)
MCH: 32.7 pg (ref 26.0–34.0)
MCHC: 31.7 g/dL (ref 30.0–36.0)
MCV: 103.3 fL — ABNORMAL HIGH (ref 80.0–100.0)
Platelets: 285 10*3/uL (ref 150–400)
RBC: 3.3 MIL/uL — ABNORMAL LOW (ref 4.22–5.81)
RDW: 13.2 % (ref 11.5–15.5)
WBC: 7.3 10*3/uL (ref 4.0–10.5)
nRBC: 0 % (ref 0.0–0.2)

## 2023-09-16 MED ORDER — POTASSIUM CHLORIDE CRYS ER 20 MEQ PO TBCR
40.0000 meq | EXTENDED_RELEASE_TABLET | Freq: Once | ORAL | Status: AC
  Administered 2023-09-16: 40 meq via ORAL
  Filled 2023-09-16: qty 2

## 2023-09-16 MED ORDER — POLYETHYLENE GLYCOL 3350 17 G PO PACK
17.0000 g | PACK | Freq: Every day | ORAL | Status: DC
  Administered 2023-09-16 – 2023-09-17 (×2): 17 g via ORAL
  Filled 2023-09-16 (×2): qty 1

## 2023-09-16 NOTE — Progress Notes (Signed)
 Patient 98% on RA. Worked with therapy and maintained saturations during and after therapy.

## 2023-09-16 NOTE — Evaluation (Signed)
 Physical Therapy Evaluation Patient Details Name: [name] Pratt MRN: [id number] DOB: 08/28/1941 Today's Date: 09/16/2023  History of Present Illness  Patient is a 83 year old male who was admitted on 1/3 with aspiration pneumonia, acute respiratory failure, and constipation. Pt with extensive PMHx including smoker, COPD, anemia, sinus bradycardia, GERD, HTN, back sugeries  Clinical Impression  Pt admitted with above diagnosis.  Pt currently with functional limitations due to the deficits listed below (see PT Problem List). Pt will benefit from acute skilled PT to increase their independence and safety with mobility to allow discharge.  Pt very talkative and tangential but agreeable to participate.  Pt ambulated in hallway and mildly unsteady but no overt LOB.  Pt typically uses RW at baseline and reports performing stairs to his man cave with rail and SPC.  Pt's SPO2 monitored on room air during ambulation and remained 98%.  Recommended HHPT upon d/c due to hx of falls and LE weakness if pt agreeable.  (Seems pt has numerous HEPs and equipment at home however not using them currently).           If plan is discharge home, recommend the following: Help with stairs or ramp for entrance;Assistance with cooking/housework   Can travel by private vehicle        Equipment Recommendations None recommended by PT  Recommendations for Other Services       Functional Status Assessment Patient has had a recent decline in their functional status and demonstrates the ability to make significant improvements in function in a reasonable and predictable amount of time.     Precautions / Restrictions Precautions Precautions: Fall      Mobility  Bed Mobility               General bed mobility comments: pt in recliner    Transfers Overall transfer level: Needs assistance Equipment used: Rolling walker (2 wheels) Transfers: Sit to/from Stand Sit to Stand: Contact guard assist            General transfer comment: CGA seondary to posterior lean upon standing; pt reports no need for assist takes a minute to get started    Ambulation/Gait Ambulation/Gait assistance: Contact guard assist Gait Distance (Feet): 350 Feet Assistive device: Rolling walker (2 wheels) Gait Pattern/deviations: Step-through pattern, Decreased stride length, Trunk flexed       General Gait Details: verbal cues for RW positioning, SpO2 98% on room air with ambulation (RN okay with leaving off O2 [place] upon returning to room); pt reports ambulation similar to baseline but slower; mildly unsteady at times but no physical assist required  Stairs            Wheelchair Mobility     Tilt Bed    Modified Rankin (Stroke Patients Only)       Balance Overall balance assessment: Mild deficits observed, not formally tested, History of Falls                                           Pertinent Vitals/Pain Pain Assessment Pain Assessment: No/denies pain    Home Living Family/patient expects to be discharged to:: Private residence Living Arrangements: Spouse/significant other Available Help at Discharge: Family Type of Home: House Home Access: Stairs to enter Entrance Stairs-Rails: Doctor, General Practice of Steps: 5 Alternate Level Stairs-Number of Steps: 12 Home Layout: Multi-level;Able to live on main level with  bedroom/bathroom Home Equipment: Shower seat - built in;Cane - single Librarian, Academic (2 wheels);Grab bars - tub/shower;Grab bars - toilet;Hand held shower head;Adaptive equipment Additional Comments: man cave is upstairs    Prior Function Prior Level of Function : Independent/Modified Independent;Driving             Mobility Comments: uses space saver RW that folds up to a smaller footprint for tight spaces - typically uses in the morning for safety ADLs Comments: mod I independent ADLs with AE (is not able to use sock donner, or  long handle shoe horn), light IADLs     Extremity/Trunk Assessment        Lower Extremity Assessment Lower Extremity Assessment: Generalized weakness    Cervical / Trunk Assessment Cervical / Trunk Assessment: Kyphotic  Communication   Communication Communication: No apparent difficulties  Cognition Arousal: Alert Behavior During Therapy: WFL for tasks assessed/performed Overall Cognitive Status: Within Functional Limits for tasks assessed                                 General Comments: very talkative, tangential, follows commands and able to provide history        General Comments      Exercises     Assessment/Plan    PT Assessment Patient needs continued PT services  PT Problem List Decreased strength;Decreased activity tolerance;Decreased balance;Decreased mobility;Decreased knowledge of use of DME       PT Treatment Interventions DME instruction;Gait training;Neuromuscular re-education;Balance training;Functional mobility training;Therapeutic activities;Therapeutic exercise;Patient/family education;Stair training    PT Goals (Current goals can be found in the Care Plan section)  Acute Rehab PT Goals PT Goal Formulation: With patient Time For Goal Achievement: 09/30/23 Potential to Achieve Goals: Good    Frequency Min 1X/week     Co-evaluation               AM-PAC PT 6 Clicks Mobility  Outcome Measure Help needed turning from your back to your side while in a flat bed without using bedrails?: A Little Help needed moving from lying on your back to sitting on the side of a flat bed without using bedrails?: A Little Help needed moving to and from a bed to a chair (including a wheelchair)?: A Little Help needed standing up from a chair using your arms (e.g., wheelchair or bedside chair)?: A Little Help needed to walk in hospital room?: A Little Help needed climbing 3-5 steps with a railing? : A Little 6 Click Score: 18    End of  Session Equipment Utilized During Treatment: Gait belt Activity Tolerance: Patient tolerated treatment well Patient left: in chair;with call bell/phone within reach;with family/visitor present Nurse Communication: Mobility status PT Visit Diagnosis: Difficulty in walking, not elsewhere classified (R26.2);Muscle weakness (generalized) (M62.81)    Time: 8483-8452 PT Time Calculation (min) (ACUTE ONLY): 31 min   Charges:   PT Evaluation $PT Eval Low Complexity: 1 Low PT Treatments $Gait Training: 8-22 mins PT General Charges $$ ACUTE PT VISIT: 1 Visit       Tari PT, DPT Physical Therapist Acute Rehabilitation Services Office: [phone]   Tari CROME [place] 09/16/2023, 4:46 PM

## 2023-09-16 NOTE — Progress Notes (Signed)
 [facility] PROGRESS NOTE   [name] Pratt FMW:[id number] DOB: [birth year] DOA: 09/13/2023  PCP: Daryl Setter, NP  Brief History: 83 y.o. male with medical history significant of remote smoking history.  Patient reports having a history of COPD, which is corroborated on record review below.  However patient does not use supplementary oxygen at home.  Presented after being constipated for several days.  Was noted to be hypoxic in the emergency department.  CT scan suggested aspiration pneumonia.  Patient was hospitalized for further management.  Consultants: None  Procedures: None    Subjective/Interval History: Continues to have some discomfort in his abdomen but at the same time he is having bowel movements.  No nausea.  Appetite remains poor.  Occasional cough is present.  Denies any chest pain.  Shortness of breath is improved.    Assessment/Plan:  Aspiration pneumonia/acute respiratory failure with hypoxia Patient had several episodes of nausea and vomiting.  He likely aspirated.  CT angiogram did not show any PE. Patient started on Unasyn  and azithromycin . Oxygen saturations have improved.  Hopefully oxygen can be weaned off over the next 24 hours.    Constipation No obstruction noted on imaging studies.  Started on laxatives.  Having multiple bowel movements.  Continue with the aggressive bowel regimen.  CT report was reviewed again.  No clear indication to repeat imaging studies at this time.  Back pain Chronic issue.  Followed by neurosurgery.  Had injections to his back about 2 months ago.  MRI of the thoracic and lumbar spine does show spinal disease but no significant spinal stenosis.  Does not have any focal neurological deficits except for weakness in the right lower leg which is chronic for him.  Lidoderm  patch.  PT evaluation.  Macrocytic anemia Stable hemoglobin compared to previous values.  Chronic kidney disease stage IIIa Creatinine stable for  the most part.  Monitor urine output.  Sinus bradycardia Stable.  Essential hypertension Noted to be on amlodipine  hydralazine  and irbesartan .  Monitor blood pressures closely.  GERD Continue PPI  Hyperlipidemia Continue with statin.  Hypothyroidism Continue with levothyroxine .  TSH noted to be 7.74.  Free T4 is 0.88.  No changes to his medications.  DVT Prophylaxis: Lovenox  Code Status: Full code Family Communication: No family at bedside Disposition Plan: Hopefully return home when improved.  PT and OT evaluation.       Medications: Scheduled:  allopurinol   100 mg Oral BID   amLODipine   2.5 mg Oral Daily   aspirin  EC  81 mg Oral QHS   cycloSPORINE   1 drop Both Eyes BID   enoxaparin  (LOVENOX ) injection  40 mg Subcutaneous Q24H   fluticasone   1 spray Each Nare QHS   gabapentin   300 mg Oral QHS   hydrALAZINE   50 mg Oral TID   irbesartan   300 mg Oral Daily   levothyroxine   150 mcg Oral Once per day on Monday Tuesday Wednesday Thursday Friday Saturday   levothyroxine   75 mcg Oral Q7 days   lidocaine   1 patch Transdermal Q24H   magnesium  oxide  400 mg Oral Daily   pantoprazole  (PROTONIX ) IV  40 mg Intravenous Q24H   polyethylene glycol  17 g Oral Daily   potassium chloride   40 mEq Oral Once   rosuvastatin   5 mg Oral Daily   senna-docusate  2 tablet Oral BID   sodium chloride  flush  3 mL Intravenous Q12H   sodium chloride  flush  3-10 mL Intravenous Q12H   Continuous:  ampicillin -sulbactam (  UNASYN ) IV 3 g (09/16/23 0242)   azithromycin  500 mg (09/15/23 1842)   PRN:acetaminophen  **OR** acetaminophen , acidophilus, albuterol , cholestyramine  light, hydrALAZINE , HYDROmorphone , loratadine , polyvinyl alcohol , sodium chloride  flush  Antibiotics: Anti-infectives (From admission, onward)    Start     Dose/Rate Route Frequency Ordered Stop   09/15/23 0000  Ampicillin -Sulbactam (UNASYN ) 3 g in sodium chloride  0.9 % 100 mL IVPB        3 g 200 mL/hr over 30 Minutes  Intravenous Every 6 hours 09/14/23 1726     09/14/23 1815  Ampicillin -Sulbactam (UNASYN ) 3 g in sodium chloride  0.9 % 100 mL IVPB        3 g 200 mL/hr over 30 Minutes Intravenous  Once 09/14/23 1721 09/14/23 2045   09/14/23 1800  azithromycin  (ZITHROMAX ) 500 mg in sodium chloride  0.9 % 250 mL IVPB        500 mg 250 mL/hr over 60 Minutes Intravenous Every 24 hours 09/14/23 1707 09/17/23 1759   09/14/23 0545  piperacillin -tazobactam (ZOSYN ) IVPB 3.375 g        3.375 g 100 mL/hr over 30 Minutes Intravenous  Once 09/14/23 0537 09/14/23 0624       Objective:  Vital Signs  Vitals:   09/15/23 1947 09/15/23 2146 09/16/23 0027 09/16/23 0524  BP:    (!) 150/76  Pulse:  (!) 54  (!) 53  Resp: 18 18 11 18   Temp:    97.6 F (36.4 C)  TempSrc:      SpO2:  97%  96%  Weight:      Height:        Intake/Output Summary (Last 24 hours) at 09/16/2023 1038 Last data filed at 09/16/2023 0900 Gross per 24 hour  Intake 603 ml  Output 200 ml  Net 403 ml   Filed Weights   09/14/23 1700  Weight: 73.7 kg    General appearance: Awake alert.  In no distress Resp: Normal effort at rest.  Coarse breath sounds with few crackles at the bases.  No wheezing or rhonchi. Cardio: S1-S2 is normal regular.  No S3-S4.  No rubs murmurs or bruit GI: Abdomen is soft.  Mildly tender in the lower abdomen without any rebound rigidity or guarding.  No masses organomegaly. Extremities: No edema.  Full range of motion of lower extremities. Neurologic: Alert and oriented x3.  No focal neurological deficits.   Lab Results:  Data Reviewed: I have personally reviewed following labs and reports of the imaging studies  CBC: Recent Labs  Lab 09/13/23 1956 09/14/23 1700 09/15/23 0708 09/16/23 0635  WBC 13.8* 10.8* 8.9 7.3  HGB 12.9* 11.2* 10.7* 10.8*  HCT 39.3 35.0* 33.6* 34.1*  MCV 99.0 102.6* 103.1* 103.3*  PLT 351 326 282 285    Basic Metabolic Panel: Recent Labs  Lab 09/13/23 1956 09/14/23 1700  09/15/23 0708 09/16/23 0635  NA 139  --  139 138  K 3.6  --  3.7 3.4*  CL 103  --  105 107  CO2 27  --  26 23  GLUCOSE 132*  --  92 90  BUN 29*  --  23 24*  CREATININE 1.29* 1.14 1.34* 1.42*  CALCIUM  10.0  --  8.5* 8.5*    GFR: Estimated Creatinine Clearance: 35.6 mL/min (A) (by C-G formula based on SCr of 1.42 mg/dL (H)).  Liver Function Tests: Recent Labs  Lab 09/13/23 1956  AST 30  ALT 22  ALKPHOS 76  BILITOT 0.5  PROT 8.2*  ALBUMIN  4.4  Recent Labs  Lab 09/13/23 1956  LIPASE 30    Coagulation Profile: Recent Labs  Lab 09/15/23 0708  INR 1.1    Thyroid  Function Tests: Recent Labs    09/15/23 0708  TSH 7.740*  FREET4 0.88     Recent Results (from the past 240 hours)  Blood culture (routine x 2)     Status: None (Preliminary result)   Collection Time: 09/14/23  5:45 AM   Specimen: BLOOD LEFT FOREARM  Result Value Ref Range Status   Specimen Description   Final    BLOOD LEFT FOREARM Performed at [facility], [address] [place], [place] [address]    Special Requests   Final    BOTTLES DRAWN AEROBIC AND ANAEROBIC Blood Culture results may not be optimal due to an inadequate volume of blood received in culture bottles Performed at [facility], [address] [place], [place] [address]    Culture   Final    NO GROWTH 2 DAYS Performed at [facility], [address], [place], [place] [address]    Report Status PENDING  Incomplete  Blood culture (routine x 2)     Status: None (Preliminary result)   Collection Time: 09/14/23  5:45 AM   Specimen: BLOOD LEFT WRIST  Result Value Ref Range Status   Specimen Description   Final    BLOOD LEFT WRIST Performed at [facility], [address] [place], [place] [address]    Special Requests   Final    BOTTLES DRAWN AEROBIC ONLY Performed at [facility], [address] [place], [place] [address]    Culture   Final    NO GROWTH 2  DAYS Performed at [facility], [address], [place], [place] [address]    Report Status PENDING  Incomplete      Radiology Studies: DG Swallowing Func-Speech Pathology Result Date: 09/15/2023 Table formatting from the original result was not included. Modified Barium Swallow Study Patient Details Name: [name] Pratt MRN: [id number] Date of Birth: [birth year] Today's Date: 09/15/2023 HPI/PMH: HPI: 83 y.o. male with medical history significant of remote smoking history, COPD, admitted with acute respiratory failure and found to have PNA, concerning from aspiration etiology. Per pt and chart review pt with some emesis and states he has had progressive dysphagia over the last year. Clinical Impression:  Pt presents with a mild oropharyngeal dysphagia. Oral deficits included mild oral residuals and delayed oral transit. Pt with excellent airway protection throughout the study without evidence of laryngeal penetration or tracheal aspiration of all consistencies trialed (thin liquids, nectar thick, puree, solids, and barium tablet with thin liquids). Mild vallecular and pyriform sinus residuals noted with thicker POs (second swallows and thin liquid alternation assisted to reduce). Recommend dysphagia 3 (mechanical soft) and thin liquids with meds as tolerated. Pt reports baseline diet is softer solids due to missing dentition and difficulty with harder and dry POs. Will follow up for reinforcement of compensatory strategies to maximize pt swallow efficiency and safety. No data recorded Factors that may increase risk of adverse event in presence of aspiration Noe & Lianne 2021): No data recorded Recommendations/Plan: Swallowing Evaluation Recommendations Swallowing Evaluation Recommendations Recommendations: PO diet PO Diet Recommendation: Dysphagia 3 (Mechanical soft); Thin liquids (Level 0) Liquid Administration via: Cup; Straw Medication Administration: Whole meds with liquid Supervision: Staff to  assist with self-feeding; Intermittent supervision/cueing for swallowing strategies Swallowing strategies  : Slow  rate; Small bites/sips; Follow solids with liquids; effortful swallow Postural changes: Stay upright 30-60 min after meals; Position pt fully upright for meals Oral care recommendations: Oral care BID (2x/day) Treatment Plan Treatment Plan Treatment recommendations: Therapy as outlined in treatment plan below Follow-up recommendations: Follow physicians's recommendations for discharge plan and follow up therapies Functional status assessment: Patient has had a recent decline in their functional status and demonstrates the ability to make significant improvements in function in a reasonable and predictable amount of time. Treatment frequency: Min 1x/week Treatment duration: 2 weeks Interventions: Compensatory techniques; Patient/family education; Diet toleration management by SLP Recommendations Recommendations for follow up therapy are one component of a multi-disciplinary discharge planning process, led by the attending physician.  Recommendations may be updated based on patient status, additional functional criteria and insurance authorization. Assessment: Orofacial Exam: Orofacial Exam Oral Cavity: Oral Hygiene: Xerostomia Oral Cavity - Dentition: Missing dentition Orofacial Anatomy: WFL Oral Motor/Sensory Function: WFL Anatomy: Anatomy: Presence of cervical hardware Boluses Administered: Boluses Administered Boluses Administered: Thin liquids (Level 0); Mildly thick liquids (Level 2, nectar thick); Puree; Solid (barium tablet)  Oral Impairment Domain: Oral Impairment Domain Lip Closure: No labial escape Tongue control during bolus hold: Escape to lateral buccal cavity/floor of mouth Bolus preparation/mastication: Slow prolonged chewing/mashing with complete recollection Bolus transport/lingual motion: Delayed initiation of tongue motion (oral holding) Oral residue: Trace residue lining oral  structures Location of oral residue : Tongue; Floor of mouth Initiation of pharyngeal swallow : Pyriform sinuses  Pharyngeal Impairment Domain: Pharyngeal Impairment Domain Soft palate elevation: No bolus between soft palate (SP)/pharyngeal wall (PW) Laryngeal elevation: Partial superior movement of thyroid  cartilage/partial approximation of arytenoids to epiglottic petiole Anterior hyoid excursion: Partial anterior movement Epiglottic movement: Complete inversion Laryngeal vestibule closure: Complete, no air/contrast in laryngeal vestibule Pharyngeal stripping wave : Present - diminished Pharyngeal contraction (A/P view only): N/A Pharyngoesophageal segment opening: Partial distention/partial duration, partial obstruction of flow Tongue base retraction: Narrow column of contrast or air between tongue base and PPW Pharyngeal residue: Collection of residue within or on pharyngeal structures Location of pharyngeal residue: Tongue base; Valleculae; Pyriform sinuses  Esophageal Impairment Domain: No data recorded Pill: Pill Consistency administered: Thin liquids (Level 0) Thin liquids (Level 0): [facility] Penetration/Aspiration Scale Score: Penetration/Aspiration Scale Score 1.  Material does not enter airway: Thin liquids (Level 0); Mildly thick liquids (Level 2, nectar thick); Moderately thick liquids (Level 3, honey thick); Puree; Solid; Pill Compensatory Strategies: Compensatory Strategies Compensatory strategies: Yes Straw: Ineffective Effortful swallow: -- (aids in reducing residuals) Multiple swallows: -- (aids in reducing residuals) Liquid wash: -- (aids in reducing residuals)   General Information: Caregiver present: No  Diet Prior to this Study: Thin liquids (Level 0)   Temperature : Normal   Respiratory Status: WFL   Supplemental O2: Nasal cannula   History of Recent Intubation: No  Behavior/Cognition: Alert; Cooperative; Pleasant mood Self-Feeding Abilities: Needs set-up for self-feeding Baseline vocal  quality/speech: Normal Volitional Cough: Able to elicit Volitional Swallow: Able to elicit No data recorded Goal Planning: No data recorded Barriers to Reach Goals: Time post onset No data recorded Patient/Family Stated Goal: help swallowing No data recorded Pain: No data recorded End of Session: Start Time:SLP Start Time (ACUTE ONLY): 1000 Stop Time: SLP Stop Time (ACUTE ONLY): 1012 Time Calculation:SLP Time Calculation (min) (ACUTE ONLY): 12 min Charges: SLP Evaluations $ SLP Speech Visit: 1 Visit SLP Evaluations $BSS Swallow: 1 Procedure $MBS Swallow: 1 Procedure SLP visit diagnosis: SLP Visit Diagnosis: Dysphagia, oral phase (R13.11); Dysphagia,  unspecified (R13.10) Past Medical History: Past Medical History: Diagnosis Date  Allergic rhinitis 02/02/2016  Anemia 12/17/2009  Formatting of this note might be different from the original. 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 Hypertension  Benign paroxysmal positional vertigo 09/14/2021  Bilateral sacroiliitis (HCC) 07/22/2021  Body mass index (BMI) 25.0-25.9, adult 11/02/2020  Cancer (HCC)   skin cancer  Cardiac murmur 07/25/2021  Carotid stenosis   a. Carotid U/S 5/13: LICA < 50%, RICA 50-69%;  b.  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  Cystoid macular edema of both eyes 07/28/2020  Degenerative disc disease, lumbar 06/30/2015  Deviated nasal septum  02/18/2015  Diverticulosis 03/03/2020  DJD (degenerative joint disease) of hip 12/24/2012  Dyspnea 06/12/2011  Emphysema, unspecified (HCC) 03/05/2020  Erectile dysfunction 06/20/2017  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  Gait disorder 03/02/2021  GERD (gastroesophageal reflux disease)   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  Qualifier: Diagnosis of  By: Gwenn Runell    Greater trochanteric pain syndrome 07/22/2021  Hand pain, left 01/16/2022  Hearing loss 02/02/2016  Heme positive stool 02/29/2020  HIATAL HERNIA 05/13/2009  Qualifier: Diagnosis of  By: Gwenn Runell    High risk medication use 01/18/2012  Hip pain 03/02/2021  History of chicken pox   History of COVID-19 12/05/2021  History of hemorrhoids   History of kidney stones   History of skin cancer 07/22/2021  skin cancer  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 Osteoarthritis Of The Hand  10/1 IMO update  Osteoarthrosis, unspecified whether generalized or localized, pelvic region and thigh 07/25/2010  Formatting of this note might be different from the original. Osteoarthritis Of The Hip  Other abnormal glucose 07/22/2021   Other allergic rhinitis 02/18/2015  Palpitations 07/25/2021  Peripheral neuropathy 06/19/2016  Preventative health care 11/24/2015  Right groin pain 11/29/2012  Right inguinal hernia 05/13/2009  Qualifier: Diagnosis of  By: Gwenn Runell    RLS (restless legs syndrome) 02/18/2015  Rosacea   Rotator cuff tear 07/27/2013  Situational depression 03/02/2021  Skin lesion 01/16/2022  Sleep apnea with use of continuous positive airway pressure (CPAP) 07/15/2013  CPAP set to  7 cm water,  Residual AHi was 7.8  With no leak.  User time  6 hours.  479 days , not used only 12 days - highly compliant 06-23-13  .   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  Urinary retention 11/26/2020  Ventral hernia 07/08/2012  Vertigo 07/08/2021  Weight loss 03/03/2020 Past Surgical History: Past Surgical History: Procedure Laterality Date  ABDOMINAL HERNIA REPAIR  07/15/12  Dr Kingston  ANTERIOR CERVICAL DECOMP/DISCECTOMY FUSION N/A 07/09/2020  Procedure: ANTERIOR CERVICAL DECOMPRESSION AND FUSION CERVICAL THREE-FOUR.;  Surgeon: Onetha Arley, MD;  Location[facility] OR;  Service: Neurosurgery;  Laterality: N/A;  anterior  BROW LIFT  05/07/01  COLONOSCOPY WITH PROPOFOL  N/A 04/24/2022  Procedure: COLONOSCOPY WITH PROPOFOL ;  Surgeon: Eda Iha, MD;  Location: [facility] ENDOSCOPY;  Service: Gastroenterology;  Laterality: N/A;  COLONOSCOPY WITH PROPOFOL  N/A 04/26/2022  Procedure: COLONOSCOPY WITH PROPOFOL ;  Surgeon: Eda Iha, MD;  Location: [facility] ENDOSCOPY;  Service: Gastroenterology;  Laterality: N/A;  CYSTOSCOPY WITH URETEROSCOPY AND STENT PLACEMENT Right 01/28/2014  Procedure: CYSTOSCOPY WITH URETEROSCOPY, BASKET RETRIVAL AND  STENT  PLACEMENT;  Surgeon: Alm GORMAN Fragmin, MD;  Location: [facility] ORS;  Service: Urology;  Laterality: Right;  epidural injections    multiple procedures  ESOPHAGEAL DILATION  1993 and 1994  multiple times  EYE SURGERY Bilateral 03/25/10, 2012  cataract removal  HEMORRHOID SURGERY  2014  HIATAL HERNIA REPAIR  12/21/93  HOLMIUM LASER APPLICATION Right 01/28/2014  Procedure: HOLMIUM LASER APPLICATION;  Surgeon: Alm GORMAN Fragmin, MD;  Location: [facility] ORS;  Service: Urology;  Laterality: Right;  INGUINAL HERNIA REPAIR Right 07/15/12  Dr Kingston, x2  JOINT REPLACEMENT  07/25/04  right knee  JOINT REPLACEMENT  07/13/10  left knee  KNEE SURGERY  1995  LAPAROSCOPIC CHOLECYSTECTOMY  09/20/06  with intraoperative cholangiogram and right inguinal herniorrhaphy with mesh   LIGAMENT REPAIR Left 07/2013  shoulder  LITHOTRIPSY    x2  LUMBAR LAMINECTOMY/DECOMPRESSION MICRODISCECTOMY Bilateral 12/27/2022  Procedure: Lumbar Two-Three, Lumbar Three-Four Sublaminar Decompression;  Surgeon: Onetha Arley, MD;  Location: [facility] OR;  Service: Neurosurgery;  Laterality: Bilateral;  NASAL SEPTUM SURGERY  1975  POLYPECTOMY  04/26/2022  Procedure: POLYPECTOMY;  Surgeon: Eda Iha, MD;  Location: [facility] ENDOSCOPY;  Service: Gastroenterology;;  POSTERIOR CERVICAL FUSION/FORAMINOTOMY N/A 11/24/2020  Procedure: Posterior Cervical Laminectomy Cervical three-four, Cervical four-five with lateral mass fusion;  Surgeon: Onetha Arley, MD;  Location: [facility] OR;  Service: Neurosurgery;  Laterality: N/A;  REFRACTIVE SURGERY Bilateral   Right total hip replacement  4/14  SKIN CANCER DESTRUCTION    nose, ear  TONSILLECTOMY  as child  TOTAL HIP ARTHROPLASTY Left   URETHRAL DILATION  1991  Dr. Watt Cree E Hartness MA, CCC-SLP 09/15/2023, 10:31 AM  MR THORACIC SPINE WO CONTRAST Result Date: 09/14/2023 CLINICAL DATA:  Myelopathy, chronic, lumbar spine; Myelopathy, acute, thoracic spine EXAM: MRI THORACIC AND LUMBAR SPINE WITHOUT CONTRAST TECHNIQUE: Multiplanar and multiecho pulse sequences  of the thoracic and lumbar spine were obtained without intravenous contrast. COMPARISON:  None Available. FINDINGS: MRI THORACIC SPINE FINDINGS Alignment:  No substantial sagittal subluxation. Vertebrae: No fracture, evidence of discitis, or suspicious bone lesion. Cord:  Normal cord signal. Paraspinal and other soft tissues: Small bilateral pleural effusions. Disc levels: Left paracentral disc protrusion at T6-T7 and right paracentral disc protrusion at T7-T8. No significant canal stenosis. Mild effacement of  the subarticular recesses. Mild foraminal stenosis at multiple levels. No high-grade foraminal narrowing. MRI LUMBAR SPINE FINDINGS Segmentation:  Standard. Alignment: Slight grade 1 retrolisthesis of L1 on L2 and L2 on L3. Grade 1 anterolisthesis of L5 on S1. Vertebrae: Degenerative/discogenic endplate signal changes at L1-L2 and L3-L4. No specific evidence of acute fracture or discitis/osteomyelitis. Conus medullaris and cauda equina: Conus extends to the T12-L1 level. Conus and cauda equina appear normal. Paraspinal and other soft tissues: Unremarkable.  Right renal cyst. Disc levels: T12-L1: Facet arthropathy and slight disc bulging. No significant canal stenosis. Mild left foraminal stenosis. No significant right foraminal stenosis. L1-L2: Disc bulging. Facet arthropathy. Resulting moderate right and mild left foraminal stenosis. Subarticular recess narrowing bilaterally without significant central canal stenosis. L2-L3: Posterior disc bulge, endplate spurring, and facet arthropathy. Severe right foraminal stenosis, similar. Patent left foramen and canal. Subarticular recess narrowing bilaterally, similar. L3-L4: Disc bulging endplate spurring. Facet arthropathy. Resulting moderate to severe left and moderate right foraminal stenosis. Patent canal. L4-L5: Disc bulging and endplate spurring. Bilateral facet arthropathy. Resulting moderate left greater than right foraminal stenosis, similar. Patent canal.  L5-S1: Uncovering the disc. Bilateral facet arthropathy. Similar moderate left and mild right foraminal stenosis. Patent canal. IMPRESSION: MR THORACIC SPINE IMPRESSION 1. Mild multilevel foraminal stenosis. 2. Disc protrusions at multiple levels without significant canal stenosis. 3. Small bilateral pleural effusions. MR LUMBAR SPINE IMPRESSION 1. At L2-L3, similar severe right foraminal stenosis. Similar bilateral subarticular recess stenosis. 2. At L3-L4, similar moderate to severe left and moderate right foraminal stenosis. 3. At L4-L5, similar moderate left greater than right foraminal stenosis. 4. At L5-S1, similar moderate left and mild right foraminal stenosis. Electronically Signed   By: Gilmore GORMAN Molt M.D.   On: 09/14/2023 23:40   MR LUMBAR SPINE WO CONTRAST Result Date: 09/14/2023 CLINICAL DATA:  Myelopathy, chronic, lumbar spine; Myelopathy, acute, thoracic spine EXAM: MRI THORACIC AND LUMBAR SPINE WITHOUT CONTRAST TECHNIQUE: Multiplanar and multiecho pulse sequences of the thoracic and lumbar spine were obtained without intravenous contrast. COMPARISON:  None Available. FINDINGS: MRI THORACIC SPINE FINDINGS Alignment:  No substantial sagittal subluxation. Vertebrae: No fracture, evidence of discitis, or suspicious bone lesion. Cord:  Normal cord signal. Paraspinal and other soft tissues: Small bilateral pleural effusions. Disc levels: Left paracentral disc protrusion at T6-T7 and right paracentral disc protrusion at T7-T8. No significant canal stenosis. Mild effacement of the subarticular recesses. Mild foraminal stenosis at multiple levels. No high-grade foraminal narrowing. MRI LUMBAR SPINE FINDINGS Segmentation:  Standard. Alignment: Slight grade 1 retrolisthesis of L1 on L2 and L2 on L3. Grade 1 anterolisthesis of L5 on S1. Vertebrae: Degenerative/discogenic endplate signal changes at L1-L2 and L3-L4. No specific evidence of acute fracture or discitis/osteomyelitis. Conus medullaris and cauda  equina: Conus extends to the T12-L1 level. Conus and cauda equina appear normal. Paraspinal and other soft tissues: Unremarkable.  Right renal cyst. Disc levels: T12-L1: Facet arthropathy and slight disc bulging. No significant canal stenosis. Mild left foraminal stenosis. No significant right foraminal stenosis. L1-L2: Disc bulging. Facet arthropathy. Resulting moderate right and mild left foraminal stenosis. Subarticular recess narrowing bilaterally without significant central canal stenosis. L2-L3: Posterior disc bulge, endplate spurring, and facet arthropathy. Severe right foraminal stenosis, similar. Patent left foramen and canal. Subarticular recess narrowing bilaterally, similar. L3-L4: Disc bulging endplate spurring. Facet arthropathy. Resulting moderate to severe left and moderate right foraminal stenosis. Patent canal. L4-L5: Disc bulging and endplate spurring. Bilateral facet arthropathy. Resulting moderate left greater than right foraminal stenosis, similar. Patent canal. L5-S1:  Uncovering the disc. Bilateral facet arthropathy. Similar moderate left and mild right foraminal stenosis. Patent canal. IMPRESSION: MR THORACIC SPINE IMPRESSION 1. Mild multilevel foraminal stenosis. 2. Disc protrusions at multiple levels without significant canal stenosis. 3. Small bilateral pleural effusions. MR LUMBAR SPINE IMPRESSION 1. At L2-L3, similar severe right foraminal stenosis. Similar bilateral subarticular recess stenosis. 2. At L3-L4, similar moderate to severe left and moderate right foraminal stenosis. 3. At L4-L5, similar moderate left greater than right foraminal stenosis. 4. At L5-S1, similar moderate left and mild right foraminal stenosis. Electronically Signed   By: Gilmore GORMAN Molt M.D.   On: 09/14/2023 23:40       LOS: 1 day   Tirza Senteno  [facility] Pager on [email or web address]  09/16/2023, 10:38 AM

## 2023-09-16 NOTE — Progress Notes (Signed)
   09/16/23 2319  BiPAP/CPAP/SIPAP  BiPAP/CPAP/SIPAP Pt Type Adult  BiPAP/CPAP/SIPAP DREAMSTATIOND  Mask Type Full face mask  Mask Size Medium  Respiratory Rate 18 breaths/min  FiO2 (%) 21 %  Patient Home Equipment No  Auto Titrate Yes (15/5)  CPAP/SIPAP surface wiped down Yes  BiPAP/CPAP /SiPAP Vitals  Pulse Rate 66  Resp 18  SpO2 97 %  Bilateral Breath Sounds Diminished  MEWS Score/Color  MEWS Score 0  MEWS Score Color Landy

## 2023-09-17 DIAGNOSIS — J9601 Acute respiratory failure with hypoxia: Secondary | ICD-10-CM | POA: Diagnosis not present

## 2023-09-17 LAB — BASIC METABOLIC PANEL
Anion gap: 9 (ref 5–15)
BUN: 26 mg/dL — ABNORMAL HIGH (ref 8–23)
CO2: 22 mmol/L (ref 22–32)
Calcium: 8.6 mg/dL — ABNORMAL LOW (ref 8.9–10.3)
Chloride: 109 mmol/L (ref 98–111)
Creatinine, Ser: 1.43 mg/dL — ABNORMAL HIGH (ref 0.61–1.24)
GFR, Estimated: 49 mL/min — ABNORMAL LOW (ref >60)
Glucose, Bld: 86 mg/dL (ref 70–99)
Potassium: 3.5 mmol/L (ref 3.5–5.1)
Sodium: 140 mmol/L (ref 135–145)

## 2023-09-17 MED ORDER — AMOXICILLIN-POT CLAVULANATE 875-125 MG PO TABS: 1.0000 | ORAL_TABLET | Freq: Two times a day (BID) | ORAL | 0 refills | Status: AC

## 2023-09-17 MED ORDER — SACCHAROMYCES BOULARDII 250 MG PO CAPS: 250.0000 mg | ORAL_CAPSULE | Freq: Two times a day (BID) | ORAL | 0 refills | Status: AC

## 2023-09-17 MED ORDER — AMOXICILLIN-POT CLAVULANATE 875-125 MG PO TABS
1.0000 | ORAL_TABLET | Freq: Two times a day (BID) | ORAL | Status: DC
  Administered 2023-09-17: 1 via ORAL
  Filled 2023-09-17: qty 1

## 2023-09-17 MED ORDER — SACCHAROMYCES BOULARDII 250 MG PO CAPS
250.0000 mg | ORAL_CAPSULE | Freq: Two times a day (BID) | ORAL | Status: DC
  Administered 2023-09-17: 250 mg via ORAL
  Filled 2023-09-17: qty 1

## 2023-09-17 MED ORDER — POTASSIUM CHLORIDE CRYS ER 20 MEQ PO TBCR
40.0000 meq | EXTENDED_RELEASE_TABLET | Freq: Once | ORAL | Status: AC
  Administered 2023-09-17: 40 meq via ORAL
  Filled 2023-09-17: qty 2

## 2023-09-17 MED ORDER — SENNOSIDES-DOCUSATE SODIUM 8.6-50 MG PO TABS: 2.0000 | ORAL_TABLET | Freq: Two times a day (BID) | ORAL | 0 refills | Status: DC

## 2023-09-17 NOTE — Progress Notes (Signed)
 Pharmacy Antibiotic Note  [name] Pratt is a 83 y.o. male admitted on 09/13/2023 with aspiration pneumonia. Pharmacy has been consulted for Unasyn  dosing.  Now day #4 of abx for aspiration PNA. S/p 3 days of azithro. Weaning off oxygen. SCr remains elevated but stable.  Plan: Continue Unasyn  3g IV q6h. Consider 5 day course to stop after 1/7? Monitor renal function, cultures, clinical course  Height: 5' 6 (167.6 cm) Weight: 73.7 kg (162 lb 7.7 oz) IBW/kg (Calculated) : 63.8  Temp (24hrs), Avg:97.8 F (36.6 C), Min:97.7 F (36.5 C), Max:98.1 F (36.7 C)  Recent Labs  Lab 09/13/23 1956 09/13/23 2338 09/14/23 0149 09/14/23 1700 09/15/23 0708 09/16/23 0635 09/17/23 0534  WBC 13.8*  --   --  10.8* 8.9 7.3  --   CREATININE 1.29*  --   --  1.14 1.34* 1.42* 1.43*  LATICACIDVEN  --  1.0 0.9  --   --   --   --     Estimated Creatinine Clearance: 35.3 mL/min (A) (by C-G formula based on SCr of 1.43 mg/dL (H)).    Allergies  Allergen Reactions   Pravastatin  Other (See Comments)    Muscle cramps   Sildenafil  Other (See Comments)    Tachycardia     Oxycodone  Other (See Comments)    Hallucinations    Atenolol Other (See Comments)    Bradycardia    Metoclopramide Hcl Anxiety    Antimicrobials this admission: 1/3 Zosyn  x 1 1/3 Azithromycin  >> (1/5) 1/3 Unasyn  >>  Dose adjustments this admission: --  Microbiology results: 1/3 BCx: ngtd  Thank you for allowing pharmacy to be a part of this patient's care.  Rankin Dee, PharmD, BCPS, BCIDP Clinical Pharmacist 09/17/2023 7:53 AM

## 2023-09-17 NOTE — TOC Transition Note (Signed)
 Transition of Care [facility]) - Discharge Note   Patient Details  Name: [name] Pratt MRN: [id number] Date of Birth: 1941/07/06  [facility] CM/SW Contact:  Hoy DELENA Bigness, LCSW Phone Number: 09/17/2023, 10:30 AM   Clinical Narrative:    Met with pt at bedside to discuss recommendation for HHPT/OT. Pt reports having HH in the past and recently graduating from [facility]. Pt is unsure of what agency provided [facility] services in the past but, is agreeable to having HH arranged and does not have agency preference. CMS list of HHA provided to pt. HHPT/OT arranged with [facility]. No DME needs. Pt reports his son will be providing transportation home.    Final next level of care: Home w Home Health Services Barriers to Discharge: Barriers Resolved   Patient Goals and CMS Choice Patient states their goals for this hospitalization and ongoing recovery are:: home CMS Medicare.gov Compare Post Acute Care list provided to:: Patient Choice offered to / list presented to : Patient [place] ownership interest in [facility]provided to::  (NA)    Discharge Placement                       Discharge Plan and Services Additional resources added to the After Visit Summary for     Discharge Planning Services: CM Consult              DME Agency: NA       HH Arranged: PT, OT HH Agency: [facility] Date [facility] Agency Contacted: 09/17/23 Time HH Agency Contacted: [phone] Representative spoke with at [facility] Agency: Cindie  Social Drivers of Health (SDOH) Interventions SDOH Screenings   Food Insecurity: No Food Insecurity (09/15/2023)  Housing: Low Risk  (09/15/2023)  Transportation Needs: No Transportation Needs (09/15/2023)  Utilities: Not At Risk (09/15/2023)  Alcohol  Screen: Low Risk  (01/19/2022)  Depression (PHQ2-9): Low Risk  (05/09/2023)  Financial Resource Strain: Low Risk  (10/28/2021)  Physical Activity: Inactive (09/03/2023)  Social Connections: Moderately Isolated  (09/14/2023)  Stress: Stress Concern Present (09/03/2023)  Tobacco Use: Medium Risk (09/13/2023)     Readmission Risk Interventions    09/17/2023   10:29 AM 04/27/2022    8:05 AM  Readmission Risk Prevention Plan  Transportation Screening Complete Complete  PCP or Specialist Appt within 3-5 Days  Complete  HRI or Home Care Consult  Complete  Social Work Consult for Recovery Care Planning/Counseling  Complete  Palliative Care Screening  Not Applicable  Medication Review Oceanographer) Complete Complete  PCP or Specialist appointment within 3-5 days of discharge Complete   HRI or Home Care Consult Complete   SW Recovery Care/Counseling Consult Complete   Palliative Care Screening Not Applicable   Skilled Nursing Facility Not Applicable

## 2023-09-17 NOTE — Discharge Summary (Signed)
 [facility]  Physician Discharge Summary   Patient ID: [name] Pratt MRN: [id number] DOB/AGE: [birth year] 83 y.o.  Admit date: 09/13/2023 Discharge date: 09/17/2023    PCP: Daryl Setter, NP  DISCHARGE DIAGNOSES:    Acute respiratory failure with hypoxia (HCC), resolved Aspiration pneumonia   Hypothyroidism   GOUT   Rosacea   GERD (gastroesophageal reflux disease)   Allergic rhinitis   HTN (hypertension)   Hyperlipidemia   RECOMMENDATIONS FOR OUTPATIENT FOLLOW UP: Follow-up with PCP for further management of constipation Please recheck thyroid  function tests in a few weeks.   Home Health: PT OT Equipment/Devices: None  CODE STATUS: Full code  DISCHARGE CONDITION: fair  Diet recommendation: As before  INITIAL HISTORY: 83 y.o. male with medical history significant of remote smoking history.  Patient reports having a history of COPD, which is corroborated on record review below.  However patient does not use supplementary oxygen at home.  Presented after being constipated for several days.  Was noted to be hypoxic in the emergency department.  CT scan suggested aspiration pneumonia.  Patient was hospitalized for further management.   HOSPITAL COURSE:   Aspiration pneumonia/acute respiratory failure with hypoxia Patient had several episodes of nausea and vomiting.  He likely aspirated.  CT angiogram did not show any PE. Patient started on Unasyn  and azithromycin . Has been weaned off of oxygen.  Changed over to Augmentin  at discharge.   Constipation No obstruction noted on imaging studies.  Started on laxatives.  Having multiple bowel movements.  Continue with the aggressive bowel regimen.   Outpatient follow-up with PCP for further management.   Back pain Chronic issue.  Followed by neurosurgery.  Had injections to his back about 2 months ago.  MRI of the thoracic and lumbar spine does show spinal disease but no significant spinal stenosis.  Does not  have any focal neurological deficits except for weakness in the right lower leg which is chronic for him.  Treated with Lidoderm .  Seen by PT.  Was able to ambulate.   Macrocytic anemia Stable hemoglobin compared to previous values.   Chronic kidney disease stage IIIa Creatinine stable for the most part.     Sinus bradycardia Stable.   Essential hypertension Noted to be on amlodipine  hydralazine  and irbesartan .     GERD Continue PPI   Hyperlipidemia Continue with statin.   Hypothyroidism Continue with levothyroxine .  TSH noted to be 7.74.  Free T4 is 0.88.  No changes to his medications.  Patient is.  Feels better.  Wants to go home.  Okay for discharge today.  PERTINENT LABS:  The results of significant diagnostics from this hospitalization (including imaging, microbiology, ancillary and laboratory) are listed below for reference.    Microbiology: Recent Results (from the past 240 hours)  Blood culture (routine x 2)     Status: None (Preliminary result)   Collection Time: 09/14/23  5:45 AM   Specimen: BLOOD LEFT FOREARM  Result Value Ref Range Status   Specimen Description   Final    BLOOD LEFT FOREARM Performed at [facility], [address] [place], [place] [address]    Special Requests   Final    BOTTLES DRAWN AEROBIC AND ANAEROBIC Blood Culture results may not be optimal due to an inadequate volume of blood received in culture bottles Performed at [facility], [address] [place], [place] [address]    Culture   Final    NO GROWTH 4 DAYS Performed at  [facility], [address], [place], [place] [address]    Report Status PENDING  Incomplete  Blood culture (routine x 2)     Status: None (Preliminary result)   Collection Time: 09/14/23  5:45 AM   Specimen: BLOOD LEFT WRIST  Result Value Ref Range Status   Specimen Description   Final    BLOOD LEFT WRIST Performed at [facility], [address] [place],  [place] [address]    Special Requests   Final    BOTTLES DRAWN AEROBIC ONLY Performed at [facility], [address] [place], [place] [address]    Culture   Final    NO GROWTH 4 DAYS Performed at [facility], [address] [place], [place] [address]    Report Status PENDING  Incomplete     Labs:   Basic Metabolic Panel: Recent Labs  Lab 09/13/23 1956 09/14/23 1700 09/15/23 0708 09/16/23 0635 09/17/23 0534  NA 139  --  139 138 140  K 3.6  --  3.7 3.4* 3.5  CL 103  --  105 107 109  CO2 27  --  26 23 22   GLUCOSE 132*  --  92 90 86  BUN 29*  --  23 24* 26*  CREATININE 1.29* 1.14 1.34* 1.42* 1.43*  CALCIUM  10.0  --  8.5* 8.5* 8.6*   Liver Function Tests: Recent Labs  Lab 09/13/23 1956  AST 30  ALT 22  ALKPHOS 76  BILITOT 0.5  PROT 8.2*  ALBUMIN  4.4   Recent Labs  Lab 09/13/23 1956  LIPASE 30   CBC: Recent Labs  Lab 09/13/23 1956 09/14/23 1700 09/15/23 0708 09/16/23 0635  WBC 13.8* 10.8* 8.9 7.3  HGB 12.9* 11.2* 10.7* 10.8*  HCT 39.3 35.0* 33.6* 34.1*  MCV 99.0 102.6* 103.1* 103.3*  PLT 351 326 282 285    BNP: BNP (last 3 results) Recent Labs    09/13/23 1956  BNP 232.3*     IMAGING STUDIES DG Swallowing Func-Speech Pathology Result Date: 09/15/2023 Table formatting from the original result was not included. Modified Barium Swallow Study Patient Details Name: [name] Pratt MRN: [id number] Date of Birth: April 15, 1941 Today's Date: 09/15/2023 HPI/PMH: HPI: 83 y.o. male with medical history significant of remote smoking history, COPD, admitted with acute respiratory failure and found to have PNA, concerning from aspiration etiology. Per pt and chart review pt with some emesis and states he has had progressive dysphagia over the last year. Clinical Impression:  Pt presents with a mild oropharyngeal dysphagia. Oral deficits included mild oral residuals and delayed oral transit. Pt with excellent airway protection throughout the study without  evidence of laryngeal penetration or tracheal aspiration of all consistencies trialed (thin liquids, nectar thick, puree, solids, and barium tablet with thin liquids). Mild vallecular and pyriform sinus residuals noted with thicker POs (second swallows and thin liquid alternation assisted to reduce). Recommend dysphagia 3 (mechanical soft) and thin liquids with meds as tolerated. Pt reports baseline diet is softer solids due to missing dentition and difficulty with harder and dry POs. Will follow up for reinforcement of compensatory strategies to maximize pt swallow efficiency and safety. No data recorded Factors that may increase risk of adverse event in presence of aspiration Noe & Lianne 2021): No data recorded Recommendations/Plan: Swallowing Evaluation Recommendations Swallowing Evaluation Recommendations Recommendations: PO diet PO Diet Recommendation: Dysphagia 3 (Mechanical soft); Thin liquids (Level 0) Liquid Administration via: Cup; Straw Medication Administration: Whole meds  with liquid Supervision: Staff to assist with self-feeding; Intermittent supervision/cueing for swallowing strategies Swallowing strategies  : Slow rate; Small bites/sips; Follow solids with liquids; effortful swallow Postural changes: Stay upright 30-60 min after meals; Position pt fully upright for meals Oral care recommendations: Oral care BID (2x/day) Treatment Plan Treatment Plan Treatment recommendations: Therapy as outlined in treatment plan below Follow-up recommendations: Follow physicians's recommendations for discharge plan and follow up therapies Functional status assessment: Patient has had a recent decline in their functional status and demonstrates the ability to make significant improvements in function in a reasonable and predictable amount of time. Treatment frequency: Min 1x/week Treatment duration: 2 weeks Interventions: Compensatory techniques; Patient/family education; Diet toleration management by SLP  Recommendations Recommendations for follow up therapy are one component of a multi-disciplinary discharge planning process, led by the attending physician.  Recommendations may be updated based on patient status, additional functional criteria and insurance authorization. Assessment: Orofacial Exam: Orofacial Exam Oral Cavity: Oral Hygiene: Xerostomia Oral Cavity - Dentition: Missing dentition Orofacial Anatomy: WFL Oral Motor/Sensory Function: WFL Anatomy: Anatomy: Presence of cervical hardware Boluses Administered: Boluses Administered Boluses Administered: Thin liquids (Level 0); Mildly thick liquids (Level 2, nectar thick); Puree; Solid (barium tablet)  Oral Impairment Domain: Oral Impairment Domain Lip Closure: No labial escape Tongue control during bolus hold: Escape to lateral buccal cavity/floor of mouth Bolus preparation/mastication: Slow prolonged chewing/mashing with complete recollection Bolus transport/lingual motion: Delayed initiation of tongue motion (oral holding) Oral residue: Trace residue lining oral structures Location of oral residue : Tongue; Floor of mouth Initiation of pharyngeal swallow : Pyriform sinuses  Pharyngeal Impairment Domain: Pharyngeal Impairment Domain Soft palate elevation: No bolus between soft palate (SP)/pharyngeal wall (PW) Laryngeal elevation: Partial superior movement of thyroid  cartilage/partial approximation of arytenoids to epiglottic petiole Anterior hyoid excursion: Partial anterior movement Epiglottic movement: Complete inversion Laryngeal vestibule closure: Complete, no air/contrast in laryngeal vestibule Pharyngeal stripping wave : Present - diminished Pharyngeal contraction (A/P view only): N/A Pharyngoesophageal segment opening: Partial distention/partial duration, partial obstruction of flow Tongue base retraction: Narrow column of contrast or air between tongue base and PPW Pharyngeal residue: Collection of residue within or on pharyngeal structures Location  of pharyngeal residue: Tongue base; Valleculae; Pyriform sinuses  Esophageal Impairment Domain: No data recorded Pill: Pill Consistency administered: Thin liquids (Level 0) Thin liquids (Level 0): [facility] Penetration/Aspiration Scale Score: Penetration/Aspiration Scale Score 1.  Material does not enter airway: Thin liquids (Level 0); Mildly thick liquids (Level 2, nectar thick); Moderately thick liquids (Level 3, honey thick); Puree; Solid; Pill Compensatory Strategies: Compensatory Strategies Compensatory strategies: Yes Straw: Ineffective Effortful swallow: -- (aids in reducing residuals) Multiple swallows: -- (aids in reducing residuals) Liquid wash: -- (aids in reducing residuals)   General Information: Caregiver present: No  Diet Prior to this Study: Thin liquids (Level 0)   Temperature : Normal   Respiratory Status: WFL   Supplemental O2: Nasal cannula   History of Recent Intubation: No  Behavior/Cognition: Alert; Cooperative; Pleasant mood Self-Feeding Abilities: Needs set-up for self-feeding Baseline vocal quality/speech: Normal Volitional Cough: Able to elicit Volitional Swallow: Able to elicit No data recorded Goal Planning: No data recorded Barriers to Reach Goals: Time post onset No data recorded Patient/Family Stated Goal: help swallowing No data recorded Pain: No data recorded End of Session: Start Time:SLP Start Time (ACUTE ONLY): 1000 Stop Time: SLP Stop Time (ACUTE ONLY): 1012 Time Calculation:SLP Time Calculation (min) (ACUTE ONLY): 12 min Charges: SLP Evaluations $ SLP Speech Visit: 1 Visit SLP Evaluations $BSS  Swallow: 1 Procedure $MBS Swallow: 1 Procedure SLP visit diagnosis: SLP Visit Diagnosis: Dysphagia, oral phase (R13.11); Dysphagia, unspecified (R13.10) Past Medical History: Past Medical History: Diagnosis Date  Allergic rhinitis 02/02/2016  Anemia 12/17/2009  Formatting of this note might be different from the original. 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 Hypertension  Benign paroxysmal positional vertigo 09/14/2021  Bilateral sacroiliitis (HCC) 07/22/2021  Body mass index (BMI) 25.0-25.9, adult 11/02/2020  Cancer (HCC)   skin cancer  Cardiac murmur 07/25/2021  Carotid stenosis   a. Carotid U/S 5/13: LICA < 50%, RICA 50-69%;  b.  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  Cystoid macular edema of both eyes 07/28/2020  Degenerative disc disease, lumbar 06/30/2015  Deviated nasal septum 02/18/2015  Diverticulosis 03/03/2020  DJD (degenerative joint disease) of hip 12/24/2012  Dyspnea 06/12/2011  Emphysema, unspecified (HCC) 03/05/2020  Erectile dysfunction 06/20/2017  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  Gait disorder 03/02/2021  GERD (gastroesophageal reflux disease)   GOUT 05/13/2009  Qualifier: Diagnosis of  By: Gwenn Runell    Greater trochanteric pain syndrome 07/22/2021  Hand pain, left 01/16/2022  Hearing loss 02/02/2016  Heme positive stool 02/29/2020  HIATAL HERNIA 05/13/2009  Qualifier: Diagnosis of  By: Gwenn Runell    High risk medication use 01/18/2012  Hip pain 03/02/2021  History of chicken pox   History of COVID-19 12/05/2021  History of hemorrhoids   History of kidney stones   History of skin cancer 07/22/2021  skin cancer  History of total bilateral knee replacement 07/08/2020   Hyperglycemia 03/03/2014  Hyperlipidemia with target LDL less than 130 12/27/2010  Formatting of this note might be different from the original. 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 Osteoarthritis Of The Hand  10/1 IMO update  Osteoarthrosis, unspecified whether generalized or localized, pelvic region and thigh 07/25/2010  Formatting of this note might be different from the original. Osteoarthritis Of The Hip  Other abnormal glucose 07/22/2021  Other allergic rhinitis 02/18/2015  Palpitations 07/25/2021  Peripheral neuropathy 06/19/2016  Preventative health care 11/24/2015  Right groin pain 11/29/2012  Right inguinal hernia 05/13/2009  Qualifier: Diagnosis of  By: Gwenn Runell    RLS (restless legs syndrome) 02/18/2015  Rosacea   Rotator cuff tear 07/27/2013  Situational depression 03/02/2021  Skin lesion 01/16/2022  Sleep apnea with use of continuous positive airway pressure (CPAP) 07/15/2013  CPAP  set to  7 cm water,  Residual AHi was 7.8  With no leak.  User time 6 hours.  479 days , not used only 12 days - highly compliant 06-23-13  .   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  Urinary retention 11/26/2020  Ventral hernia 07/08/2012  Vertigo 07/08/2021  Weight loss 03/03/2020 Past Surgical History: Past Surgical History: Procedure Laterality Date  ABDOMINAL HERNIA REPAIR  07/15/12  Dr Kingston  ANTERIOR CERVICAL DECOMP/DISCECTOMY FUSION N/A 07/09/2020  Procedure: ANTERIOR CERVICAL DECOMPRESSION AND FUSION CERVICAL THREE-FOUR.;  Surgeon: Onetha Arley, MD;  Location: [facility] OR;  Service: Neurosurgery;  Laterality: N/A;  anterior  BROW LIFT  05/07/01  COLONOSCOPY WITH PROPOFOL  N/A 04/24/2022  Procedure: COLONOSCOPY WITH PROPOFOL ;  Surgeon: Eda Iha, MD;  Location: [facility] ENDOSCOPY;  Service: Gastroenterology;  Laterality: N/A;  COLONOSCOPY WITH PROPOFOL  N/A 04/26/2022  Procedure: COLONOSCOPY WITH PROPOFOL ;  Surgeon: Eda Iha, MD;  Location: [facility] ENDOSCOPY;  Service: Gastroenterology;  Laterality: N/A;  CYSTOSCOPY WITH URETEROSCOPY AND STENT PLACEMENT Right 01/28/2014  Procedure: CYSTOSCOPY WITH URETEROSCOPY, BASKET RETRIVAL AND  STENT PLACEMENT;  Surgeon: Alm GORMAN Fragmin, MD;  Location: [facility] ORS;  Service: Urology;  Laterality: Right;  epidural injections    multiple procedures  ESOPHAGEAL DILATION  1993 and 1994  multiple times  EYE SURGERY Bilateral 03/25/10, 2012  cataract removal  HEMORRHOID SURGERY  2014  HIATAL HERNIA REPAIR  12/21/93  HOLMIUM LASER APPLICATION Right 01/28/2014  Procedure: HOLMIUM LASER APPLICATION;  Surgeon: Alm GORMAN Fragmin, MD;  Location: [facility] ORS;  Service: Urology;  Laterality: Right;  INGUINAL HERNIA REPAIR Right 07/15/12  Dr Kingston, x2  JOINT REPLACEMENT  07/25/04  right knee  JOINT REPLACEMENT  07/13/10  left knee  KNEE SURGERY  1995  LAPAROSCOPIC CHOLECYSTECTOMY  09/20/06  with intraoperative cholangiogram and right inguinal herniorrhaphy with mesh   LIGAMENT REPAIR Left 07/2013  shoulder  LITHOTRIPSY    x2  LUMBAR LAMINECTOMY/DECOMPRESSION MICRODISCECTOMY Bilateral 12/27/2022  Procedure: Lumbar Two-Three,  Lumbar Three-Four Sublaminar Decompression;  Surgeon: Onetha Arley, MD;  Location: [facility] OR;  Service: Neurosurgery;  Laterality: Bilateral;  NASAL SEPTUM SURGERY  1975  POLYPECTOMY  04/26/2022  Procedure: POLYPECTOMY;  Surgeon: Eda Iha, MD;  Location: [facility] ENDOSCOPY;  Service: Gastroenterology;;  POSTERIOR CERVICAL FUSION/FORAMINOTOMY N/A 11/24/2020  Procedure: Posterior Cervical Laminectomy Cervical three-four, Cervical four-five with lateral mass fusion;  Surgeon: Onetha Arley, MD;  Location: [facility] OR;  Service: Neurosurgery;  Laterality: N/A;  REFRACTIVE SURGERY Bilateral   Right total hip replacement  4/14  SKIN CANCER DESTRUCTION    nose, ear  TONSILLECTOMY  as child  TOTAL HIP ARTHROPLASTY Left   URETHRAL DILATION  1991  Dr. Watt Cree E Hartness MA, CCC-SLP 09/15/2023, 10:31 AM  MR THORACIC SPINE WO CONTRAST Result Date: 09/14/2023 CLINICAL DATA:  Myelopathy, chronic, lumbar spine; Myelopathy, acute, thoracic spine EXAM: MRI THORACIC AND LUMBAR SPINE WITHOUT CONTRAST TECHNIQUE: Multiplanar and multiecho pulse sequences of the thoracic and lumbar spine were obtained without intravenous contrast. COMPARISON:  None Available. FINDINGS: MRI THORACIC SPINE FINDINGS Alignment:  No substantial sagittal subluxation. Vertebrae: No fracture, evidence of discitis, or suspicious bone lesion. Cord:  Normal cord signal. Paraspinal and other soft tissues: Small bilateral pleural effusions. Disc levels: Left paracentral  disc protrusion at T6-T7 and right paracentral disc protrusion at T7-T8. No significant canal stenosis. Mild effacement of the subarticular recesses. Mild foraminal stenosis at multiple levels. No high-grade foraminal narrowing. MRI LUMBAR SPINE FINDINGS Segmentation:  Standard. Alignment: Slight grade 1 retrolisthesis of L1 on L2 and L2 on L3. Grade 1 anterolisthesis of L5 on S1. Vertebrae: Degenerative/discogenic endplate signal changes at L1-L2 and L3-L4. No specific evidence of acute fracture or  discitis/osteomyelitis. Conus medullaris and cauda equina: Conus extends to the T12-L1 level. Conus and cauda equina appear normal. Paraspinal and other soft tissues: Unremarkable.  Right renal cyst. Disc levels: T12-L1: Facet arthropathy and slight disc bulging. No significant canal stenosis. Mild left foraminal stenosis. No significant right foraminal stenosis. L1-L2: Disc bulging. Facet arthropathy. Resulting moderate right and mild left foraminal stenosis. Subarticular recess narrowing bilaterally without significant central canal stenosis. L2-L3: Posterior disc bulge, endplate spurring, and facet arthropathy. Severe right foraminal stenosis, similar. Patent left foramen and canal. Subarticular recess narrowing bilaterally, similar. L3-L4: Disc bulging endplate spurring. Facet arthropathy. Resulting moderate to severe left and moderate right foraminal stenosis. Patent canal. L4-L5: Disc bulging and endplate spurring. Bilateral facet arthropathy. Resulting moderate left greater than right foraminal stenosis, similar. Patent canal. L5-S1: Uncovering the disc. Bilateral facet arthropathy. Similar moderate left and mild right foraminal stenosis. Patent canal. IMPRESSION: MR THORACIC SPINE IMPRESSION 1. Mild multilevel foraminal stenosis. 2. Disc protrusions at multiple levels without significant canal stenosis. 3. Small bilateral pleural effusions. MR LUMBAR SPINE IMPRESSION 1. At L2-L3, similar severe right foraminal stenosis. Similar bilateral subarticular recess stenosis. 2. At L3-L4, similar moderate to severe left and moderate right foraminal stenosis. 3. At L4-L5, similar moderate left greater than right foraminal stenosis. 4. At L5-S1, similar moderate left and mild right foraminal stenosis. Electronically Signed   By: Gilmore GORMAN Molt M.D.   On: 09/14/2023 23:40   MR LUMBAR SPINE WO CONTRAST Result Date: 09/14/2023 CLINICAL DATA:  Myelopathy, chronic, lumbar spine; Myelopathy, acute, thoracic spine EXAM:  MRI THORACIC AND LUMBAR SPINE WITHOUT CONTRAST TECHNIQUE: Multiplanar and multiecho pulse sequences of the thoracic and lumbar spine were obtained without intravenous contrast. COMPARISON:  None Available. FINDINGS: MRI THORACIC SPINE FINDINGS Alignment:  No substantial sagittal subluxation. Vertebrae: No fracture, evidence of discitis, or suspicious bone lesion. Cord:  Normal cord signal. Paraspinal and other soft tissues: Small bilateral pleural effusions. Disc levels: Left paracentral disc protrusion at T6-T7 and right paracentral disc protrusion at T7-T8. No significant canal stenosis. Mild effacement of the subarticular recesses. Mild foraminal stenosis at multiple levels. No high-grade foraminal narrowing. MRI LUMBAR SPINE FINDINGS Segmentation:  Standard. Alignment: Slight grade 1 retrolisthesis of L1 on L2 and L2 on L3. Grade 1 anterolisthesis of L5 on S1. Vertebrae: Degenerative/discogenic endplate signal changes at L1-L2 and L3-L4. No specific evidence of acute fracture or discitis/osteomyelitis. Conus medullaris and cauda equina: Conus extends to the T12-L1 level. Conus and cauda equina appear normal. Paraspinal and other soft tissues: Unremarkable.  Right renal cyst. Disc levels: T12-L1: Facet arthropathy and slight disc bulging. No significant canal stenosis. Mild left foraminal stenosis. No significant right foraminal stenosis. L1-L2: Disc bulging. Facet arthropathy. Resulting moderate right and mild left foraminal stenosis. Subarticular recess narrowing bilaterally without significant central canal stenosis. L2-L3: Posterior disc bulge, endplate spurring, and facet arthropathy. Severe right foraminal stenosis, similar. Patent left foramen and canal. Subarticular recess narrowing bilaterally, similar. L3-L4: Disc bulging endplate spurring. Facet arthropathy. Resulting moderate to severe left and moderate right foraminal stenosis. Patent canal. L4-L5: Disc bulging  and endplate spurring. Bilateral facet  arthropathy. Resulting moderate left greater than right foraminal stenosis, similar. Patent canal. L5-S1: Uncovering the disc. Bilateral facet arthropathy. Similar moderate left and mild right foraminal stenosis. Patent canal. IMPRESSION: MR THORACIC SPINE IMPRESSION 1. Mild multilevel foraminal stenosis. 2. Disc protrusions at multiple levels without significant canal stenosis. 3. Small bilateral pleural effusions. MR LUMBAR SPINE IMPRESSION 1. At L2-L3, similar severe right foraminal stenosis. Similar bilateral subarticular recess stenosis. 2. At L3-L4, similar moderate to severe left and moderate right foraminal stenosis. 3. At L4-L5, similar moderate left greater than right foraminal stenosis. 4. At L5-S1, similar moderate left and mild right foraminal stenosis. Electronically Signed   By: Gilmore GORMAN Molt M.D.   On: 09/14/2023 23:40   CT Angio Chest PE W and/or Wo Contrast Result Date: 09/14/2023 CLINICAL DATA:  83 year old male history of nausea and vomiting and atypical chest pain. Clinical concern for pulmonary embolism. Positive D-dimer. EXAM: CT ANGIOGRAPHY CHEST WITH CONTRAST TECHNIQUE: Multidetector CT imaging of the chest was performed using the standard protocol during bolus administration of intravenous contrast. Multiplanar CT image reconstructions and MIPs were obtained to evaluate the vascular anatomy. RADIATION DOSE REDUCTION: This exam was performed according to the departmental dose-optimization program which includes automated exposure control, adjustment of the mA and/or kV according to patient size and/or use of iterative reconstruction technique. CONTRAST:  75mL OMNIPAQUE  IOHEXOL  350 MG/ML SOLN COMPARISON:  Chest CT 08/30/2020. FINDINGS: Cardiovascular: No filling defects are noted in the pulmonary arterial tree to suggest pulmonary embolism. Heart size is borderline enlarged. There is no significant pericardial fluid, thickening or pericardial calcification. There is aortic  atherosclerosis, as well as atherosclerosis of the great vessels of the mediastinum and the coronary arteries, including calcified atherosclerotic plaque in the left main, left anterior descending, left circumflex and right coronary arteries. Calcifications of the aortic valve and mitral annulus. Mediastinum/Nodes: No pathologically enlarged mediastinal or hilar lymph nodes. Moderate-sized hiatal hernia. No axillary lymphadenopathy. Lungs/Pleura: Patchy areas of ground-glass attenuation, septal thickening, regional architectural distortion and volume loss are noted in the basal portions of the lower lobes of the lungs bilaterally. No confluent consolidative airspace disease. No pleural effusions. No definite suspicious appearing pulmonary nodules or masses are noted. Upper Abdomen: Aortic atherosclerosis.  Status post cholecystectomy. Musculoskeletal: There are no aggressive appearing lytic or blastic lesions noted in the visualized portions of the skeleton. Review of the MIP images confirms the above findings. IMPRESSION: 1. No evidence of pulmonary embolism. 2. Basal predominant infectious/inflammatory changes in the lower lobes of the lungs bilaterally. Clinical correlation for signs and symptoms of aspiration pneumonitis/pneumonia is recommended. 3. Aortic atherosclerosis, in addition to left main and three-vessel coronary artery disease. 4. There are calcifications of the aortic valve and mitral annulus. Echocardiographic correlation for evaluation of potential valvular dysfunction may be warranted if clinically indicated. Aortic Atherosclerosis (ICD10-I70.0). Electronically Signed   By: Toribio Aye M.D.   On: 09/14/2023 05:32   DG Chest Portable 1 View Result Date: 09/14/2023 CLINICAL DATA:  Hypoxia EXAM: PORTABLE CHEST 1 VIEW COMPARISON:  04/26/2023 FINDINGS: Cardiac shadow is stable. Mild left basilar atelectasis is noted. Mild central vascular congestion is noted without edema. No bony abnormality is  seen. IMPRESSION: Left basilar atelectasis. Mild vascular congestion. Electronically Signed   By: Oneil Devonshire M.D.   On: 09/14/2023 01:23   CT ABDOMEN PELVIS W CONTRAST Result Date: 09/14/2023 CLINICAL DATA:  Abdominal pain, acute, nonlocalized EXAM: CT ABDOMEN AND PELVIS WITH CONTRAST TECHNIQUE: Multidetector CT imaging of  the abdomen and pelvis was performed using the standard protocol following bolus administration of intravenous contrast. RADIATION DOSE REDUCTION: This exam was performed according to the departmental dose-optimization program which includes automated exposure control, adjustment of the mA and/or kV according to patient size and/or use of iterative reconstruction technique. CONTRAST:  OMNIPAQUE  IOHEXOL  300 MG/ML  SOLN COMPARISON:  CT abdomen pelvis 12/03/2022. FINDINGS: Lower chest: Small to moderate volume hiatal hernia. Mitral annular calcification. Hepatobiliary: Diffusely hypodense hepatic parenchyma compared to the spleen. No focal liver abnormality. Status post cholecystectomy. No biliary dilatation. Pancreas: No focal lesion. Normal pancreatic contour. No surrounding inflammatory changes. No main pancreatic ductal dilatation. Spleen: Normal in size without focal abnormality. Adrenals/Urinary Tract: No adrenal nodule bilaterally. Bilateral kidneys enhance symmetrically. 1 cm right nephrolithiasis. No left nephrolithiasis. No ureterolithiasis with limited evaluation of distal ureters due to streak artifact originating from the bilateral femoral hardware. No hydronephrosis. No hydroureter. The urinary bladder is unremarkable. Stomach/Bowel: Stomach is within normal limits. No evidence of bowel wall thickening or dilatation. Likely peristalsis of the distal sigmoid colon (301:58, 601:49). Stool throughout the colon. Colonic diverticulosis. Appendix appears normal. Vascular/Lymphatic: No abdominal aorta or iliac aneurysm. Mild atherosclerotic plaque of the aorta and its branches. No  abdominal, pelvic, or inguinal lymphadenopathy. Reproductive: Prostate is unremarkable. Other: No intraperitoneal free fluid. No intraperitoneal free gas. No organized fluid collection. Musculoskeletal: Tiny fat containing umbilical hernia with an abdominal defect of 0.5 cm. No suspicious lytic or blastic osseous lesions. No acute displaced fracture. Multilevel degenerative changes of the spine. Bilateral hip total hip arthroplasty. IMPRESSION: 1. Small to moderate volume hiatal hernia. 2. Stool throughout the colon-correlate for constipation. 3. Colonic diverticulosis with no acute diverticulitis. 4. Likely peristalsis of the distal sigmoid colon. Recommend follow up to exclude underlying bowel wall thickening. 5. Nonobstructive 1 cm right nephrolithiasis. 6. Hepatic steatosis. 7.  Aortic Atherosclerosis (ICD10-I70.0). 8. Status post cholecystectomy. Electronically Signed   By: Morgane  Naveau M.D.   On: 09/14/2023 00:46    DISCHARGE EXAMINATION: Vitals:   09/16/23 2008 09/16/23 2319 09/17/23 0506 09/17/23 1202  BP: (!) 149/73  (!) 141/69 117/67  Pulse: 66 66 (!) 52 64  Resp: 18 18 20 14   Temp: 97.7 F (36.5 C)  98.1 F (36.7 C) 97.9 F (36.6 C)  TempSrc: Oral  Oral Oral  SpO2: 97% 97% 98% 96%  Weight:      Height:       General appearance: Awake alert.  In no distress Resp: Normal effort at rest.  Improved aeration.  Few crackles at the bases.  No wheezing or rhonchi. Cardio: S1-S2 is normal regular.  No S3-S4.  No rubs murmurs or bruit GI: Abdomen is soft.  Nontender nondistended.  Bowel sounds are present normal.  No masses organomegaly   DISPOSITION: Home  Discharge Instructions     Call MD for:  difficulty breathing, headache or visual disturbances   Complete by: As directed    Call MD for:  extreme fatigue   Complete by: As directed    Call MD for:  persistant dizziness or light-headedness   Complete by: As directed    Call MD for:  persistant nausea and vomiting   Complete  by: As directed    Call MD for:  severe uncontrolled pain   Complete by: As directed    Call MD for:  temperature >100.4   Complete by: As directed    Discharge instructions   Complete by: As directed    Please  take your medications as prescribed.  Please be sure to follow-up with your primary care provider in 1 week.  You were cared for by a hospitalist during your hospital stay. If you have any questions about your discharge medications or the care you received while you were in the hospital after you are discharged, you can call the unit and asked to speak with the hospitalist on call if the hospitalist that took care of you is not available. Once you are discharged, your primary care physician will handle any further medical issues. Please note that NO REFILLS for any discharge medications will be authorized once you are discharged, as it is imperative that you return to your primary care physician (or establish a relationship with a primary care physician if you do not have one) for your aftercare needs so that they can reassess your need for medications and monitor your lab values. If you do not have a primary care physician, you can call [phone] for a physician referral.   Increase activity slowly   Complete by: As directed          Allergies as of 09/17/2023       Reactions   Pravastatin  Other (See Comments)   Muscle cramps   Sildenafil  Other (See Comments)   Tachycardia   Oxycodone  Other (See Comments)   Hallucinations   Atenolol Other (See Comments)   Bradycardia   Metoclopramide Hcl Anxiety        Medication List     STOP taking these medications    docusate sodium  100 MG capsule Commonly known as: COLACE   PROBIOTIC DAILY PO       TAKE these medications    acetaminophen  500 MG tablet Commonly known as: TYLENOL  Take 500 mg by mouth at bedtime as needed for moderate pain (pain score 4-6).   albuterol  108 (90 Base) MCG/ACT inhaler Commonly known as:  VENTOLIN  HFA INHALE 2 PUFFS INTO THE LUNGS EVERY 6 HOURS AS NEEDED FOR SHORTNESS OF BREATH. What changed: See the new instructions.   allopurinol  100 MG tablet Commonly known as: ZYLOPRIM  Take 1 tablet (100 mg total) by mouth 2 (two) times daily.   amLODipine  2.5 MG tablet Commonly known as: NORVASC  Take 1 tablet (2.5 mg total) by mouth 2 (two) times daily. HOLD AMLODIPINE  DISE IF BLOOD PRESSURE LESS THAN 110/50 What changed: when to take this   amoxicillin -clavulanate 875-125 MG tablet Commonly known as: AUGMENTIN  Take 1 tablet by mouth every 12 (twelve) hours for 5 days.   antiseptic oral rinse Liqd 15 mLs by Mouth Rinse route 2 (two) times daily.   Artificial Tears 1.4 % ophthalmic solution Generic drug: polyvinyl alcohol  Place 1 drop into both eyes daily as needed for dry eyes.   aspirin  EC 81 MG tablet Take 81 mg by mouth at bedtime.   bacitracin  ointment Apply 1 Application topically 2 (two) times daily. What changed:  when to take this reasons to take this   cholestyramine  4 GM/DOSE powder Commonly known as: QUESTRAN  Take 4 g by mouth as needed (digestion). 30 ml   Claritin  10 MG tablet Generic drug: loratadine  Take 10 mg by mouth daily as needed for allergies or rhinitis.   cycloSPORINE  0.05 % ophthalmic emulsion Commonly known as: RESTASIS  Place 1 drop into both eyes 2 (two) times daily.   fluticasone  50 MCG/ACT nasal spray Commonly known as: FLONASE  Place 1 spray into both nostrils at bedtime.   gabapentin  300 MG capsule Commonly known as: NEURONTIN  Take 1  capsule (300 mg total) by mouth at bedtime.   glucose blood test strip TRUE Metrix Blood Glucose Test Strip, use as instructed   hydrALAZINE  50 MG tablet Commonly known as: APRESOLINE  Take 1 tablet (50 mg total) by mouth 3 (three) times daily.   ICY HOT EX Apply 1 Application topically at bedtime. Lidocaine  / on back   Iron  (Ferrous Sulfate ) 325 (65 Fe) MG Tabs Take 325 mg by mouth every  other day. What changed: when to take this   Lancets Misc 1 each by Does not apply route 2 (two) times daily. TRUE matrix lancets   levothyroxine  150 MCG tablet Commonly known as: SYNTHROID  TAKE 1 TABLET ON 6 DAYS PER WEEK (TAKE 75 MCG ONE TIME PER WEEK) What changed: See the new instructions.   levothyroxine  75 MCG tablet Commonly known as: SYNTHROID  Take 75 mcg once a week (patient on 150 mcg 6 days a week) Dispense #24 for 90 day supply. What changed:  how much to take how to take this when to take this additional instructions   Magnesium  500 MG Tabs Take 500 mg by mouth daily.   metroNIDAZOLE  0.75 % cream Commonly known as: METROCREAM  Apply 1 Application topically daily as needed (head).   multivitamin-lutein  Caps capsule Take 1 capsule by mouth daily.   nitroGLYCERIN  0.4 MG SL tablet Commonly known as: Nitrostat  Place 1 tablet (0.4 mg total) under the tongue every 5 (five) minutes as needed for chest pain.   OVER THE COUNTER MEDICATION Place 1 spray into both nostrils 2 (two) times daily. Sinus saline   OVER THE COUNTER MEDICATION Take 15 mLs by mouth at bedtime. Yellow Mustard   polyethylene glycol powder 17 GM/SCOOP powder Commonly known as: GLYCOLAX /MIRALAX  Take 17 g by mouth 2 (two) times daily as needed for mild constipation, moderate constipation or severe constipation.   PREBIOTIC PRODUCT PO Take 1 tablet by mouth daily.   PRESCRIPTION MEDICATION CPAP- At bedtime and during any time of rest   rosuvastatin  5 MG tablet Commonly known as: CRESTOR  Take 1 tablet (5 mg total) by mouth daily.   saccharomyces boulardii 250 MG capsule Commonly known as: FLORASTOR Take 1 capsule (250 mg total) by mouth 2 (two) times daily for 14 days.   senna-docusate 8.6-50 MG tablet Commonly known as: Senokot-S Take 2 tablets by mouth 2 (two) times daily.   valsartan  320 MG tablet Commonly known as: DIOVAN  Take 1 tablet by mouth once daily   vitamin B-12 500 MCG  tablet Commonly known as: CYANOCOBALAMIN  Take 500 mcg by mouth in the morning.          Follow-up Information     Daryl Setter, NP. Schedule an appointment as soon as possible for a visit.   Specialty: Internal Medicine Why: post hospitalization follow up Contact information: [address] [place] [address] [phone]         Care, [facility] Follow up.   Specialty: Home Health Services Why: [facility] will follow up with you at discharge to provide home health services Contact information: [address] [place] [address] [phone]                 TOTAL DISCHARGE TIME: 35 minutes  Sol Englert Verdene  [facility] Pager on [email or web address]  09/18/2023, 11:18 AM

## 2023-09-17 NOTE — Plan of Care (Signed)

## 2023-09-17 NOTE — Progress Notes (Signed)
 Discharge instructions, RX's and follow up appts reviewed and provided to patient verbalized understanding. Patient left floor via wheelchair accompanied by staff.  Evelyn Aguinaldo, Kae Heller, RN

## 2023-09-17 NOTE — Plan of Care (Signed)
  Problem: Education: Goal: Knowledge of General Education information will improve Description: Including pain rating scale, medication(s)/side effects and non-pharmacologic comfort measures Outcome: Adequate for Discharge   Problem: Health Behavior/Discharge Planning: Goal: Ability to manage health-related needs will improve Outcome: Adequate for Discharge   Problem: Clinical Measurements: Goal: Ability to maintain clinical measurements within normal limits will improve Outcome: Adequate for Discharge Goal: Will remain free from infection Outcome: Adequate for Discharge Goal: Diagnostic test results will improve Outcome: Adequate for Discharge Goal: Respiratory complications will improve Outcome: Adequate for Discharge Goal: Cardiovascular complication will be avoided Outcome: Adequate for Discharge   Problem: Activity: Goal: Risk for activity intolerance will decrease Outcome: Adequate for Discharge   Problem: Nutrition: Goal: Adequate nutrition will be maintained Outcome: Adequate for Discharge   Problem: Coping: Goal: Level of anxiety will decrease Outcome: Adequate for Discharge   Problem: Elimination: Goal: Will not experience complications related to bowel motility Outcome: Adequate for Discharge Goal: Will not experience complications related to urinary retention Outcome: Adequate for Discharge   Problem: Pain Management: Goal: General experience of comfort will improve Outcome: Adequate for Discharge   Problem: Safety: Goal: Ability to remain free from injury will improve Outcome: Adequate for Discharge   Problem: Skin Integrity: Goal: Risk for impaired skin integrity will decrease Outcome: Adequate for Discharge   Problem: Acute Rehab OT Goals (only OT should resolve) Goal: Pt. Will Perform Lower Body Dressing Outcome: Adequate for Discharge Goal: Pt. Will Transfer To Toilet Outcome: Adequate for Discharge Goal: Pt. Will Perform Toileting-Clothing  Manipulation Outcome: Adequate for Discharge   Problem: Acute Rehab PT Goals(only PT should resolve) Goal: Pt Will Go Supine/Side To Sit Outcome: Adequate for Discharge Goal: Patient Will Transfer Sit To/From Stand Outcome: Adequate for Discharge Goal: Pt Will Ambulate Outcome: Adequate for Discharge

## 2023-09-17 NOTE — Progress Notes (Signed)

## 2023-09-18 ENCOUNTER — Telehealth: Payer: Self-pay | Admitting: *Deleted

## 2023-09-18 NOTE — Transitions of Care (Post Inpatient/ED Visit) (Signed)
 09/18/2023  Name: [name] Pratt MRN: [id number] DOB: 03/30/1941  Today's TOC FU Call Status: Today's TOC FU Call Status:: Successful TOC FU Call Completed TOC FU Call Complete Date: 09/18/23 Patient's Name and Date of Birth confirmed.  Transition Care Management Follow-up Telephone Call Date of Discharge: 09/17/23 Discharge Facility: [facility]) Type of Discharge: Inpatient Admission Primary Inpatient Discharge Diagnosis:: Abdominal pain; Acute Respiratory Failure How have you been since you were released from the hospital?: Better (I am doing fine.  Went out to eat breakfast this morning.  Feeling better.  I am pretty much independent, my wife helps me if i need anything, but I pretty much take care of myself.  I don't need anyone calling me regularly to check on me.) Any questions or concerns?: No  Items Reviewed: Did you receive and understand the discharge instructions provided?: Yes (thoroughly reviewed with patient who verbalizes good understanding of same) Medications obtained,verified, and reconciled?: Yes (Medications Reviewed) (Full medication reconciliation/ review completed; no concerns or discrepancies identified; confirmed patient obtained/ is taking all newly Rx'd medications as instructed; self-manages medications and denies questions/ concerns around medications today) Any new allergies since your discharge?: No Dietary orders reviewed?: Yes Type of Diet Ordered:: Bland; soft diet at baseline Do you have support at home?: Yes People in Home: spouse Name of Support/Comfort Primary Source: Reports essentially independent in self-care activities; supportive spouse assists as/ if needed/ indicated  Medications Reviewed Today: Medications Reviewed Today     Reviewed by Laquinton Bihm M, RN (Registered Nurse) on 09/18/23 at 1202  Med List Status: <None>   Medication Order Taking? Sig Documenting Provider Last Dose Status Informant  acetaminophen  (TYLENOL ) 500 MG  tablet [id number] Yes Take 500 mg by mouth at bedtime as needed for moderate pain (pain score 4-6). [provider] Taking Active Self, Pharmacy Records  albuterol  (VENTOLIN  HFA) 108 (90 Base) MCG/ACT inhaler [id number] Yes INHALE 2 PUFFS INTO THE LUNGS EVERY 6 HOURS AS NEEDED FOR SHORTNESS OF BREATH.  Patient taking differently: Inhale 2 puffs into the lungs every 6 (six) hours as needed for shortness of breath.   Daryl Setter, NP Taking Active Self, Pharmacy Records  allopurinol  (ZYLOPRIM ) 100 MG tablet [id number] Yes Take 1 tablet (100 mg total) by mouth 2 (two) times daily. Daryl Setter, NP Taking Active Self, Pharmacy Records  amLODipine  (NORVASC ) 2.5 MG tablet [id number] Yes Take 1 tablet (2.5 mg total) by mouth 2 (two) times daily. HOLD AMLODIPINE  DISE IF BLOOD PRESSURE LESS THAN 110/50  Patient taking differently: Take 2.5 mg by mouth daily. HOLD AMLODIPINE  DISE IF BLOOD PRESSURE LESS THAN 110/50   Daryl Setter, NP Taking Active Self, Pharmacy Records  amoxicillin -clavulanate (AUGMENTIN ) 875-125 MG tablet [id number] Yes Take 1 tablet by mouth every 12 (twelve) hours for 5 days. Verdene Purchase, MD Taking Active   antiseptic oral rinse KEENAN) LIQD [id number] Yes 15 mLs by Mouth Rinse route 2 (two) times daily. [provider] Taking Active Self, Pharmacy Records  aspirin  EC 81 MG tablet [id number] Yes Take 81 mg by mouth at bedtime. [provider] Taking Active Self, Pharmacy Records           Med Note [facility], [place]   Fri Sep 14, 2023 12:02 PM)    bacitracin  ointment [id number] Yes Apply 1 Application topically 2 (two) times daily.  Patient taking differently: Apply 1 Application topically daily as needed for wound care.   Ruthe Cornet, DO Taking Active Self, Pharmacy Records  cholestyramine  (QUESTRAN )  4 GM/DOSE powder [id number] No Take 4 g by mouth as needed (digestion). 30 ml  Patient not taking: Reported on 09/18/2023   [provider] Not Taking Active Self, Pharmacy Records  cycloSPORINE  (RESTASIS ) 0.05 % ophthalmic emulsion [id number] Yes Place 1 drop into both eyes 2 (two) times daily. [provider] Taking Active Self, Pharmacy Records  fluticasone  (FLONASE ) 50 MCG/ACT nasal spray [id number] Yes Place 1 spray into both nostrils at bedtime. [provider] Taking Active Self, Pharmacy Records  gabapentin  (NEURONTIN ) 300 MG capsule [id number] Yes Take 1 capsule (300 mg total) by mouth at bedtime. Daryl Setter, NP Taking Active Self, Pharmacy Records  glucose blood test strip [id number] Yes TRUE Metrix Blood Glucose Test Strip, use as instructed Daryl Setter, NP Taking Active Self, Pharmacy Records  hydrALAZINE  (APRESOLINE ) 50 MG tablet [id number] Yes Take 1 tablet (50 mg total) by mouth 3 (three) times daily. Daryl Setter, NP Taking Active Self, Pharmacy Records  Iron , Ferrous Sulfate , 325 (65 Fe) MG TABS [id number] Yes Take 325 mg by mouth every other day.  Patient taking differently: Take 325 mg by mouth daily.   Daryl Setter, NP Taking Active Self, Pharmacy Records  Lancets MISC [id number] Yes 1 each by Does not apply route 2 (two) times daily. TRUE matrix lancets Daryl Setter, NP Taking Active Self, Pharmacy Records  levothyroxine  (SYNTHROID ) 150 MCG tablet [id number] Yes TAKE 1 TABLET ON 6 DAYS PER WEEK (TAKE 75 MCG ONE TIME PER WEEK)  Patient taking differently: Take 150 mcg by mouth as directed. TAKE 1 TABLET ON 6 DAYS PER WEEK (TAKE 75 MCG ONE TIME PER WEEK ON SUNDAYS)   Daryl Setter, NP Taking Active Self, Pharmacy Records  levothyroxine  (SYNTHROID ) 75 MCG tablet [id number] Yes Take 75 mcg once a week (patient on 150 mcg 6 days a week) Dispense #24 for 90 day supply.  Patient taking differently: Take 75 mcg by mouth once a week. Sundays; patient on 150 mcg 6 days a week; Dispense #24 for 90 day supply.   Daryl Setter, NP Taking Active Self,  Pharmacy Records  loratadine  (CLARITIN ) 10 MG tablet [id number] Yes Take 10 mg by mouth daily as needed for allergies or rhinitis. [provider] Taking Active Self, Pharmacy Records  Magnesium  500 MG TABS [id number] Yes Take 500 mg by mouth daily. [provider] Taking Active Self, Pharmacy Records  Menthol , Topical Analgesic, (ICY HOT EX) [id number] Yes Apply 1 Application topically at bedtime. Lidocaine  / on back [provider] Taking Active Self, Pharmacy Records  metroNIDAZOLE  (METROCREAM ) 0.75 % cream [id number] Yes Apply 1 Application topically daily as needed (head). [provider] Taking Active Self, Pharmacy Records  multivitamin-lutein  [facility] ) CAPS capsule [id number] No Take 1 capsule by mouth daily.  Patient not taking: Reported on 09/18/2023   [provider] Not Taking Active Self, Pharmacy Records  nitroGLYCERIN  (NITROSTAT ) 0.4 MG SL tablet [id number] Yes Place 1 tablet (0.4 mg total) under the tongue every 5 (five) minutes as needed for chest pain. Nishan, Peter C, MD Taking Active Self, Pharmacy Records  OVER THE COUNTER MEDICATION [id number] Yes Place 1 spray into both nostrils 2 (two) times daily. Sinus saline [provider] Taking Active Self, Pharmacy Records  OVER THE COUNTER MEDICATION [id number] Yes Take 15 mLs by mouth at bedtime. Yellow Mustard [provider] Taking Active Self, Pharmacy Records  polyethylene glycol powder (GLYCOLAX /MIRALAX ) 17 GM/SCOOP powder [id number] Yes Take 17 g by mouth 2 (two) times daily as needed for mild constipation, moderate  constipation or severe constipation. Desiderio Chew, PA-C Taking Active Self, Pharmacy Records  polyvinyl alcohol  (ARTIFICIAL TEARS) 1.4 % ophthalmic solution [id number] Yes Place 1 drop into both eyes daily as needed for dry eyes. [provider] Taking Active Self, Pharmacy Records  PREBIOTIC PRODUCT PO [id number] Yes Take 1 tablet by mouth daily.  [provider] Taking Active Self, Pharmacy Records  PRESCRIPTION MEDICATION [id number] Yes CPAP- At bedtime and during any time of rest [provider] Taking Active Self, Pharmacy Records  rosuvastatin  (CRESTOR ) 5 MG tablet [id number] Yes Take 1 tablet (5 mg total) by mouth daily. Daryl Setter, NP Taking Active Self, Pharmacy Records  saccharomyces boulardii [facility]) 250 MG capsule [id number] Yes Take 1 capsule (250 mg total) by mouth 2 (two) times daily for 14 days. Verdene Purchase, MD Taking Active   senna-docusate (SENOKOT-S) 8.6-50 MG tablet [id number] Yes Take 2 tablets by mouth 2 (two) times daily. Verdene Purchase, MD Taking Active   valsartan  (DIOVAN ) 320 MG tablet [id number] Yes Take 1 tablet by mouth once daily O'Sullivan, Melissa, NP Taking Active Self, Pharmacy Records  vitamin B-12 (CYANOCOBALAMIN ) 500 MCG tablet [id number] Yes Take 500 mcg by mouth in the morning. [provider] Taking Active Self, Pharmacy Records           Home Care and Equipment/Supplies: Were Home Health Services Ordered?: Yes Name of Home Health Agency:: [facility]/ [facility]   [phone] Has Agency set up a time to come to your home?: No EMR reviewed for Home Health Orders: Orders present/patient has not received call (refer to CM for follow-up) (care coordination outreach with [facility]; spoke with Deneshia who reports they have the referral and will contact patient later today to schedule initial home visit) Any new equipment or medical supplies ordered?: No  Functional Questionnaire: Do you need assistance with bathing/showering or dressing?: No Do you need assistance with meal preparation?: No Do you need assistance with eating?: No Do you have difficulty maintaining continence: No Do you need assistance with getting out of bed/getting out of a chair/moving?: No Do you have difficulty managing or taking your medications?: No  Follow up appointments reviewed: PCP  Follow-up appointment confirmed?: Yes (care coordination outreach in real-time with scheduling care guide to successfully schedule hospital follow up PCP appointment 09/26/23- confirmed this is first available HFU appointment) Date of PCP follow-up appointment?: 09/25/22 Follow-up Provider: PCP Specialist Hospital Follow-up appointment confirmed?: NA (verified not indicated per hospital discharging provider discharge notes) Do you need transportation to your follow-up appointment?: No Do you understand care options if your condition(s) worsen?: Yes-patient verbalized understanding  SDOH Interventions Today    Flowsheet Row Most Recent Value  SDOH Interventions   Food Insecurity Interventions Intervention Not Indicated  Housing Interventions Intervention Not Indicated  Transportation Interventions Intervention Not Indicated  [reports drives self,  spouse assists as indicated]  Utilities Interventions Intervention Not Indicated      Interventions Today    Flowsheet Row Most Recent Value  Chronic Disease   Chronic disease during today's visit Other  [Abdominal pain,  pneumonia]  General Interventions   General Interventions Discussed/Reviewed General Interventions Discussed, Durable Medical Equipment (DME), Doctor Visits  Doctor Visits Discussed/Reviewed Doctor Visits Discussed, PCP  Durable Medical Equipment (DME) Vannie, Other  [CPAP]  PCP/Specialist Visits Compliance with follow-up visit  Exercise Interventions   Exercise Discussed/Reviewed Exercise Discussed  [role of home health services with importance of participation/ ongoing engagement]  Education Interventions   Education Provided Provided Education  Provided Verbal  Education On Medication  [safe use of NTG,   Provided education around expected/ usual side effects of antibiotics/ potential benefits of OTC probiotics in setting of antibiotic therapy]  Mental Health Interventions   Mental Health Discussed/Reviewed Other   [confirmed currently active with [facility] LCSW team]  Nutrition Interventions   Nutrition Discussed/Reviewed Nutrition Discussed  Pharmacy Interventions   Pharmacy Dicussed/Reviewed Pharmacy Topics Discussed  [Full medication review with updating medication list in EHR per patient report]  Safety Interventions   Safety Discussed/Reviewed Safety Discussed, Fall Risk       TOC Interventions Today    Flowsheet Row Most Recent Value  TOC Interventions   TOC Interventions Discussed/Reviewed TOC Interventions Discussed, Arranged PCP follow up less than 12 days/Care Guide scheduled, Contacted Home Health RN/OT/PT  [Patient declines need for ongoing/ further care management/ coordination outreach,  declines enrollment in 30-day TOC program- declines taking my direct phone number should needs/ concerns arise post-TOC call]       Beatris Blinda Lawrence, RN, BSN, CCRN Alumnus RN Care Manager  Transitions of Care  [facility] - [facility]  [place] [phone]: direct office

## 2023-09-19 DIAGNOSIS — D631 Anemia in chronic kidney disease: Secondary | ICD-10-CM | POA: Diagnosis not present

## 2023-09-19 DIAGNOSIS — G629 Polyneuropathy, unspecified: Secondary | ICD-10-CM | POA: Diagnosis not present

## 2023-09-19 DIAGNOSIS — J690 Pneumonitis due to inhalation of food and vomit: Secondary | ICD-10-CM | POA: Diagnosis not present

## 2023-09-19 DIAGNOSIS — I700 Atherosclerosis of aorta: Secondary | ICD-10-CM | POA: Diagnosis not present

## 2023-09-19 DIAGNOSIS — J439 Emphysema, unspecified: Secondary | ICD-10-CM | POA: Diagnosis not present

## 2023-09-19 DIAGNOSIS — I129 Hypertensive chronic kidney disease with stage 1 through stage 4 chronic kidney disease, or unspecified chronic kidney disease: Secondary | ICD-10-CM | POA: Diagnosis not present

## 2023-09-19 DIAGNOSIS — I080 Rheumatic disorders of both mitral and aortic valves: Secondary | ICD-10-CM | POA: Diagnosis not present

## 2023-09-19 DIAGNOSIS — J9601 Acute respiratory failure with hypoxia: Secondary | ICD-10-CM | POA: Diagnosis not present

## 2023-09-19 DIAGNOSIS — N1831 Chronic kidney disease, stage 3a: Secondary | ICD-10-CM | POA: Diagnosis not present

## 2023-09-19 LAB — CULTURE, BLOOD (ROUTINE X 2)
Culture: NO GROWTH
Culture: NO GROWTH

## 2023-09-20 ENCOUNTER — Telehealth: Payer: Self-pay

## 2023-09-20 DIAGNOSIS — J690 Pneumonitis due to inhalation of food and vomit: Secondary | ICD-10-CM | POA: Diagnosis not present

## 2023-09-20 DIAGNOSIS — I080 Rheumatic disorders of both mitral and aortic valves: Secondary | ICD-10-CM | POA: Diagnosis not present

## 2023-09-20 DIAGNOSIS — J9601 Acute respiratory failure with hypoxia: Secondary | ICD-10-CM | POA: Diagnosis not present

## 2023-09-20 DIAGNOSIS — I129 Hypertensive chronic kidney disease with stage 1 through stage 4 chronic kidney disease, or unspecified chronic kidney disease: Secondary | ICD-10-CM | POA: Diagnosis not present

## 2023-09-20 DIAGNOSIS — G629 Polyneuropathy, unspecified: Secondary | ICD-10-CM | POA: Diagnosis not present

## 2023-09-20 DIAGNOSIS — N1831 Chronic kidney disease, stage 3a: Secondary | ICD-10-CM | POA: Diagnosis not present

## 2023-09-20 DIAGNOSIS — D631 Anemia in chronic kidney disease: Secondary | ICD-10-CM | POA: Diagnosis not present

## 2023-09-20 DIAGNOSIS — I700 Atherosclerosis of aorta: Secondary | ICD-10-CM | POA: Diagnosis not present

## 2023-09-20 DIAGNOSIS — J439 Emphysema, unspecified: Secondary | ICD-10-CM | POA: Diagnosis not present

## 2023-09-20 NOTE — Telephone Encounter (Signed)
Spoke w/ [facility]- verbal orders given.  °

## 2023-09-20 NOTE — Telephone Encounter (Signed)
 Copied from CRM [id number]. Topic: Clinical - Home Health Verbal Orders >> Sep 19, 2023  3:41 PM Russell PARAS wrote: Caller/Agency: [facility] , Eveline Rushing Number: [phone] Service Requested: Physical Therapy Frequency: Initial assessment was completed today. Requiring verbal orders for physical therapy 2 x week for 3 weeks, then 1 x week for 2 weeks.  Any new concerns about the patient? No >> Sep 20, 2023  9:36 AM CMA Levorn RAMAN wrote: Verbal orders given to arnold, ok per pcp to continue with PT

## 2023-09-21 DIAGNOSIS — I129 Hypertensive chronic kidney disease with stage 1 through stage 4 chronic kidney disease, or unspecified chronic kidney disease: Secondary | ICD-10-CM | POA: Diagnosis not present

## 2023-09-21 DIAGNOSIS — J9601 Acute respiratory failure with hypoxia: Secondary | ICD-10-CM | POA: Diagnosis not present

## 2023-09-21 DIAGNOSIS — J439 Emphysema, unspecified: Secondary | ICD-10-CM | POA: Diagnosis not present

## 2023-09-21 DIAGNOSIS — D631 Anemia in chronic kidney disease: Secondary | ICD-10-CM | POA: Diagnosis not present

## 2023-09-21 DIAGNOSIS — I080 Rheumatic disorders of both mitral and aortic valves: Secondary | ICD-10-CM | POA: Diagnosis not present

## 2023-09-21 DIAGNOSIS — J690 Pneumonitis due to inhalation of food and vomit: Secondary | ICD-10-CM | POA: Diagnosis not present

## 2023-09-21 DIAGNOSIS — G629 Polyneuropathy, unspecified: Secondary | ICD-10-CM | POA: Diagnosis not present

## 2023-09-21 DIAGNOSIS — I700 Atherosclerosis of aorta: Secondary | ICD-10-CM | POA: Diagnosis not present

## 2023-09-21 DIAGNOSIS — N1831 Chronic kidney disease, stage 3a: Secondary | ICD-10-CM | POA: Diagnosis not present

## 2023-09-25 DIAGNOSIS — D631 Anemia in chronic kidney disease: Secondary | ICD-10-CM | POA: Diagnosis not present

## 2023-09-25 DIAGNOSIS — I080 Rheumatic disorders of both mitral and aortic valves: Secondary | ICD-10-CM | POA: Diagnosis not present

## 2023-09-25 DIAGNOSIS — N1831 Chronic kidney disease, stage 3a: Secondary | ICD-10-CM | POA: Diagnosis not present

## 2023-09-25 DIAGNOSIS — G629 Polyneuropathy, unspecified: Secondary | ICD-10-CM | POA: Diagnosis not present

## 2023-09-25 DIAGNOSIS — I129 Hypertensive chronic kidney disease with stage 1 through stage 4 chronic kidney disease, or unspecified chronic kidney disease: Secondary | ICD-10-CM | POA: Diagnosis not present

## 2023-09-25 DIAGNOSIS — J439 Emphysema, unspecified: Secondary | ICD-10-CM | POA: Diagnosis not present

## 2023-09-25 DIAGNOSIS — I700 Atherosclerosis of aorta: Secondary | ICD-10-CM | POA: Diagnosis not present

## 2023-09-25 DIAGNOSIS — J9601 Acute respiratory failure with hypoxia: Secondary | ICD-10-CM | POA: Diagnosis not present

## 2023-09-25 DIAGNOSIS — J690 Pneumonitis due to inhalation of food and vomit: Secondary | ICD-10-CM | POA: Diagnosis not present

## 2023-09-26 ENCOUNTER — Ambulatory Visit (INDEPENDENT_AMBULATORY_CARE_PROVIDER_SITE_OTHER): Payer: Medicare HMO | Admitting: Family

## 2023-09-26 VITALS — BP 160/65 | HR 66 | Temp 97.8°F | Resp 16 | Ht 66.0 in | Wt 163.0 lb

## 2023-09-26 DIAGNOSIS — G4733 Obstructive sleep apnea (adult) (pediatric): Secondary | ICD-10-CM

## 2023-09-26 DIAGNOSIS — J189 Pneumonia, unspecified organism: Secondary | ICD-10-CM

## 2023-09-26 DIAGNOSIS — I10 Essential (primary) hypertension: Secondary | ICD-10-CM

## 2023-09-26 DIAGNOSIS — D649 Anemia, unspecified: Secondary | ICD-10-CM

## 2023-09-26 DIAGNOSIS — E039 Hypothyroidism, unspecified: Secondary | ICD-10-CM | POA: Diagnosis not present

## 2023-09-26 MED ORDER — ALBUTEROL SULFATE HFA 108 (90 BASE) MCG/ACT IN AERS: 2.0000 | INHALATION_SPRAY | Freq: Four times a day (QID) | RESPIRATORY_TRACT | 3 refills | Status: DC | PRN

## 2023-09-26 MED ORDER — NITROGLYCERIN 0.4 MG SL SUBL: 0.4000 mg | SUBLINGUAL_TABLET | SUBLINGUAL | 6 refills | Status: DC | PRN

## 2023-09-26 NOTE — Patient Instructions (Signed)
 VISIT SUMMARY:  During today's visit, we discussed your blood pressure management, swallowing difficulties, back pain, right arm issues, recent pneumonia, and sleep apnea. We reviewed your current treatments and made some adjustments to better manage your conditions.  YOUR PLAN:  -HYPERTENSION: Hypertension means high blood pressure. We have decided to discontinue Amlodipine  due to your variable blood pressure readings and the low readings causing concern. Please monitor your blood pressure at home and report if the readings consistently go above 160 systolic.  -DYSPHAGIA: Dysphagia means difficulty swallowing. You have been managing this well by eating softer foods and drinking more fluids. Continue with these strategies.  -CONSTIPATION: Constipation means having difficulty with bowel movements. You have been using MiraLAX  as needed, and you should continue this as necessary.  -HYPOTHYROIDISM: Hypothyroidism means your thyroid  is not producing enough hormones. We noted mildly low thyroid  levels during your recent hospitalization. We will repeat your thyroid  function tests in 1-2 months.  -PNEUMONIA: Pneumonia is an infection in the lungs. You recently completed a course of Augmentin  for this. We will order a follow-up chest x-ray for early February to ensure your lungs have cleared completely.  -SLEEP APNEA: Sleep apnea is a condition where you stop breathing briefly during sleep. You are using your CPAP machine regularly, which is good. Continue using it as prescribed.  -GENERAL HEALTH MAINTENANCE: We will order follow-up labs to check your blood count and kidney function. Please schedule a follow-up appointment in 1 month to recheck your thyroid  function and discuss the lab results.  INSTRUCTIONS:  Please monitor your blood pressure at home and report if the readings consistently go above 160 systolic. Schedule a follow-up appointment in 1 month to recheck your thyroid  function and discuss  the lab results. Also, ensure to get a chest x-ray in early February to check if your pneumonia has resolved.

## 2023-09-26 NOTE — Assessment & Plan Note (Signed)
>>  ASSESSMENT AND PLAN FOR OSA (OBSTRUCTIVE SLEEP APNEA) WRITTEN ON 09/26/2023  4:22 PM BY O'SULLIVAN, Linlee Cromie, NP  Reports good compliance with CPAP.

## 2023-09-26 NOTE — Assessment & Plan Note (Signed)
Reports good compliance with CPAP 

## 2023-09-26 NOTE — Progress Notes (Signed)
Subjective:     Patient ID: [name] Pratt, male    DOB: [birth year], 83 y.o.   MRN: [id number]  Chief Complaint  Patient presents with   Hospitalization Follow-up    HPI  Discussed the use of AI scribe software for clinical note transcription with the patient, who gave verbal consent to proceed.  History of Present Illness     Patient was hospitalized 1/2-09/16/22 for aspiration pneumonia and acute resp failure with hypoxia.  Treated with unasyn and azithromycin.  Weaned off oxygen and sent home on augmentin. He states that he is doing great since returning home.  He has home health PT coming to his house.   The patient presents with concerns about his blood pressure management. He has been monitoring his blood pressure at home and noticed a significant drop in readings after starting Lycopene. The patient reported feeling better when his blood pressure is slightly higher and has experienced episodes of falling asleep at his desk when it is lower. He decided to stop taking amlodipine when his blood pressure dropped to 103/55 and resumed it when it increased to 151/74.  The patient also reports a history of swallowing difficulties due to dry mouth. He has been managing this by eating soft foods and drinking liquids with meals. He has also been taking prebiotics and probiotics for digestion.  The patient has a history of back pain, which has been managed with a series of shots every three months. He reports that this treatment has been the most effective in managing his pain. However, he still experiences pain in his back and hip area, especially in the mornings.  The patient also reports a problem with his right arm, which gives way when hit. He suspects this may be due to arthritis in his neck. He has a history of a torn ligament in his right arm, which is not causing significant discomfort, but he has noticed some swelling in his elbow.  The patient also reports a history of  pneumonia and has been prescribed Augmentin, which he has completed. He has been advised to have a chest x-ray a month after his pneumonia diagnosis to ensure his lungs have cleared completely.  The patient also uses a CPAP machine for sleep apnea and reports that it has been working well. He has a history of dry mouth and jaw pain, which he believes may be related to the CPAP machine.   Health Maintenance Due  Topic Date Due   FOOT EXAM  09/29/2021   Medicare Annual Wellness (AWV)  01/20/2023   COVID-19 Vaccine (6 - 2024-25 season) 09/11/2023    Past Medical History:  Diagnosis Date   Allergic rhinitis 02/02/2016   Anemia 12/17/2009   Formatting of this note might be different from the original. 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 Hypertension   Benign paroxysmal positional vertigo 09/14/2021   Bilateral sacroiliitis (HCC) 07/22/2021   Body mass index (BMI) 25.0-25.9, adult 11/02/2020   Cancer (HCC)    skin cancer   Cardiac murmur 07/25/2021   Carotid stenosis    a. Carotid U/S 5/13: LICA < 50%, RICA 50-69%;  b.  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   Cystoid macular edema of both eyes 07/28/2020   Degenerative disc disease, lumbar 06/30/2015   Deviated nasal septum 02/18/2015   Diverticulosis 03/03/2020   DJD (degenerative joint disease) of  hip 12/24/2012   Dyspnea 06/12/2011   Emphysema, unspecified (HCC) 03/05/2020   Erectile dysfunction 06/20/2017   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   Gait disorder 03/02/2021   GERD (gastroesophageal reflux disease)    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   Qualifier: Diagnosis of  By: Kem Parkinson     Greater trochanteric pain syndrome 07/22/2021   Hand pain, left 01/16/2022   Hearing loss 02/02/2016   Heme positive stool 02/29/2020   HIATAL HERNIA 05/13/2009   Qualifier: Diagnosis of  By: Kem Parkinson     High risk medication use 01/18/2012   Hip pain 03/02/2021   History of chicken pox    History of COVID-19 12/05/2021   History of hemorrhoids    History of kidney stones    History of skin cancer 07/22/2021   skin cancer   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 Cardiology - Dr Estrella Myrtle at [facility] cardiology  ICD-10 cut over   Hypertension    under control   Hypokalemia 07/08/2021   Hypothyroidism    Internal hemorrhoids 03/03/2020   Leg cramps 11/13/2019   Lower GI bleed 12/12/2012   Lumbago of lumbar region with sciatica 10/21/2020   Lumbar radiculopathy 03/02/2021   Lumbar spondylosis 11/02/2020   NEPHROLITHIASIS 05/13/2009   Qualifier: Diagnosis of  By: Kem Parkinson     Nonspecific abnormal electrocardiogram (ECG) (EKG) 01/06/2013   Numbness of hand 01/24/2022   Onychomycosis 06/30/2015   Osteoarthrosis, 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 Osteoarthritis Of The Hand  10/1 IMO update   Osteoarthrosis, unspecified whether generalized or localized, pelvic region and thigh 07/25/2010   Formatting of this note might be different from the original. Osteoarthritis Of The Hip   Other abnormal  glucose 07/22/2021   Other allergic rhinitis 02/18/2015   Palpitations 07/25/2021   Peripheral neuropathy 06/19/2016   Preventative health care 11/24/2015   Right groin pain 11/29/2012   Right inguinal hernia 05/13/2009   Qualifier: Diagnosis of  By: Kem Parkinson     RLS (restless legs syndrome) 02/18/2015   Rosacea    Rotator cuff tear 07/27/2013   Situational depression 03/02/2021   Skin lesion 01/16/2022   Sleep apnea with use of continuous positive airway pressure (CPAP) 07/15/2013   CPAP set to  7 cm water,  Residual AHi was 7.8  With no leak.  User time 6 hours.  479 days , not used only 12 days - highly compliant 06-23-13  .    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 Fawn Kirk   Ulnar neuropathy 01/24/2022   Urinary retention 11/26/2020   Ventral hernia 07/08/2012   Vertigo 07/08/2021   Weight loss 03/03/2020    Past Surgical History:  Procedure Laterality Date   ABDOMINAL HERNIA REPAIR  07/15/12   Dr Ashley Jacobs   ANTERIOR CERVICAL DECOMP/DISCECTOMY FUSION N/A 07/09/2020   Procedure: ANTERIOR CERVICAL DECOMPRESSION AND FUSION CERVICAL THREE-FOUR.;  Surgeon: Donalee Citrin, MD;  Location[facility] OR;  Service: Neurosurgery;  Laterality: N/A;  anterior   BROW LIFT  05/07/01   COLONOSCOPY WITH PROPOFOL N/A 04/24/2022   Procedure: COLONOSCOPY WITH PROPOFOL;  Surgeon: Tressia Danas, MD;  Location: [facility] ENDOSCOPY;  Service: Gastroenterology;  Laterality: N/A;   COLONOSCOPY WITH PROPOFOL N/A 04/26/2022   Procedure: COLONOSCOPY WITH PROPOFOL;  Surgeon: Tressia Danas, MD;  Location: [facility] ENDOSCOPY;  Service: Gastroenterology;  Laterality: N/A;   CYSTOSCOPY WITH URETEROSCOPY AND STENT PLACEMENT Right  01/28/2014   Procedure: CYSTOSCOPY WITH URETEROSCOPY, BASKET RETRIVAL AND  STENT PLACEMENT;  Surgeon: Valetta Fuller, MD;  Location: [facility] ORS;  Service: Urology;  Laterality: Right;   epidural injections     multiple procedures   ESOPHAGEAL DILATION  1993 and 1994   multiple times   EYE SURGERY Bilateral 03/25/10, 2012   cataract removal   HEMORRHOID SURGERY  2014   HIATAL HERNIA REPAIR  12/21/93   HOLMIUM LASER APPLICATION Right 01/28/2014   Procedure: HOLMIUM LASER APPLICATION;  Surgeon: Valetta Fuller, MD;  Location: [facility] ORS;  Service: Urology;  Laterality: Right;   INGUINAL HERNIA REPAIR Right 07/15/12   Dr Ashley Jacobs, x2   JOINT REPLACEMENT  07/25/04   right knee   JOINT REPLACEMENT  07/13/10   left knee   KNEE SURGERY  1995   LAPAROSCOPIC CHOLECYSTECTOMY  09/20/06   with intraoperative cholangiogram and right inguinal herniorrhaphy with mesh    LIGAMENT REPAIR Left 07/2013   shoulder   LITHOTRIPSY     x2   LUMBAR LAMINECTOMY/DECOMPRESSION MICRODISCECTOMY Bilateral 12/27/2022   Procedure: Lumbar Two-Three, Lumbar Three-Four Sublaminar Decompression;  Surgeon: Donalee Citrin, MD;  Location: [facility] OR;  Service: Neurosurgery;  Laterality: Bilateral;   NASAL SEPTUM SURGERY  1975   POLYPECTOMY  04/26/2022   Procedure: POLYPECTOMY;  Surgeon: Tressia Danas, MD;  Location: [facility] ENDOSCOPY;  Service: Gastroenterology;;   POSTERIOR CERVICAL FUSION/FORAMINOTOMY N/A 11/24/2020   Procedure: Posterior Cervical Laminectomy Cervical three-four, Cervical four-five with lateral mass fusion;  Surgeon: Donalee Citrin, MD;  Location: [facility] OR;  Service: Neurosurgery;  Laterality: N/A;   REFRACTIVE SURGERY Bilateral    Right total hip replacement  4/14   SKIN CANCER DESTRUCTION     nose, ear   TONSILLECTOMY  as child   TOTAL HIP ARTHROPLASTY Left    URETHRAL DILATION  1991   Dr. Annabell Howells    Family History  Problem Relation Age of Onset   Cancer Mother    Hypertension Other    Cancer Other    Osteoarthritis Other     Stomach cancer Neg Hx    Colon cancer Neg Hx    Esophageal cancer Neg Hx     Social History   Socioeconomic History   Marital status: Married    Spouse name: Britta Mccreedy   Number of children: 1   Years of education: Not on file   Highest education level: Not on file  Occupational History   Occupation: firefighter    Comment: paints on the side   Occupation: retired  Tobacco  Use   Smoking status: Former    Current packs/day: 0.00    Types: Cigarettes    Start date: 09/12/1967    Quit date: 09/11/1978    Years since quitting: 45.0   Smokeless tobacco: Never  Vaping Use   Vaping status: Never Used  Substance and Sexual Activity   Alcohol use: Not Currently    Alcohol/week: 0.0 standard drinks of alcohol    Comment: rare   Drug use: No   Sexual activity: Not Currently  Other Topics Concern   Not on file  Social History Narrative   Lives with his wife   He has one son- lives locally.   2 grandchildren   Has worked for Warden/ranger   Enjoys wood working   Completed HS.  [employer]   Social Drivers of Corporate investment banker Strain: Low Risk  (10/28/2021)   Overall Financial Resource Strain (CARDIA)    Difficulty of Paying Living Expenses: Not very hard  Food Insecurity: No Food Insecurity (09/18/2023)   Hunger Vital Sign    Worried About Running Out of Food in the Last Year: Never true    Ran Out of Food in the Last Year: Never true  Transportation Needs: No Transportation Needs (09/18/2023)   PRAPARE - Administrator, Civil Service (Medical): No    Lack of Transportation (Non-Medical): No  Physical Activity: Inactive (09/03/2023)   Exercise Vital Sign    Days of Exercise per Week: 0 days    Minutes of Exercise per Session: 0 min  Stress: Stress Concern Present (09/03/2023)   [employer] of Occupational Health - Occupational Stress Questionnaire    Feeling of Stress : To some extent  Social Connections: Moderately Isolated (09/14/2023)   Social  Connection and Isolation Panel [NHANES]    Frequency of Communication with Friends and Family: Three times a week    Frequency of Social Gatherings with Friends and Family: Three times a week    Attends Religious Services: Never    Active Member of Clubs or Organizations: No    Attends Banker Meetings: Never    Marital Status: Married  Catering manager Violence: Not At Risk (09/18/2023)   Humiliation, Afraid, Rape, and Kick questionnaire    Fear of Current or Ex-Partner: No    Emotionally Abused: No    Physically Abused: No    Sexually Abused: No    Outpatient Medications Prior to Visit  Medication Sig Dispense Refill   acetaminophen (TYLENOL) 500 MG tablet Take 500 mg by mouth at bedtime as needed for moderate pain (pain score 4-6).     allopurinol (ZYLOPRIM) 100 MG tablet Take 1 tablet (100 mg total) by mouth 2 (two) times daily. 180 tablet 1   antiseptic oral rinse (BIOTENE) LIQD 15 mLs by Mouth Rinse route 2 (two) times daily.     aspirin EC 81 MG tablet Take 81 mg by mouth at bedtime.     bacitracin ointment Apply 1 Application topically 2 (two) times daily. (Patient taking differently: Apply 1 Application topically daily as needed for wound care.) 120 g 0   cholestyramine (QUESTRAN) 4 GM/DOSE powder Take 4 g by mouth as needed (digestion). 30 ml     cycloSPORINE (RESTASIS) 0.05 % ophthalmic emulsion Place 1 drop into both eyes 2 (two) times daily.     fluticasone (FLONASE) 50 MCG/ACT nasal spray Place 1 spray into both nostrils at bedtime.     gabapentin (NEURONTIN) 300 MG capsule Take  1 capsule (300 mg total) by mouth at bedtime. 90 capsule 1   glucose blood test strip TRUE Metrix Blood Glucose Test Strip, use as instructed 100 each 12   hydrALAZINE (APRESOLINE) 50 MG tablet Take 1 tablet (50 mg total) by mouth 3 (three) times daily. 270 tablet 0   Iron, Ferrous Sulfate, 325 (65 Fe) MG TABS Take 325 mg by mouth every other day. (Patient taking differently: Take 325 mg  by mouth daily.) 30 tablet 0   Lancets MISC 1 each by Does not apply route 2 (two) times daily. TRUE matrix lancets 100 each 3   levothyroxine (SYNTHROID) 150 MCG tablet TAKE 1 TABLET ON 6 DAYS PER WEEK (TAKE 75 MCG ONE TIME PER WEEK) (Patient taking differently: Take 150 mcg by mouth as directed. TAKE 1 TABLET ON 6 DAYS PER WEEK (TAKE 75 MCG ONE TIME PER WEEK ON SUNDAYS)) 78 tablet 3   levothyroxine (SYNTHROID) 75 MCG tablet Take 75 mcg once a week (patient on 150 mcg 6 days a week) Dispense #24 for 90 day supply. (Patient taking differently: Take 75 mcg by mouth once a week. Sundays; patient on 150 mcg 6 days a week; Dispense #24 for 90 day supply.) 24 tablet 1   loratadine (CLARITIN) 10 MG tablet Take 10 mg by mouth daily as needed for allergies or rhinitis.     Magnesium 500 MG TABS Take 500 mg by mouth daily.     Menthol, Topical Analgesic, (ICY HOT EX) Apply 1 Application topically at bedtime. Lidocaine / on back     metroNIDAZOLE (METROCREAM) 0.75 % cream Apply 1 Application topically daily as needed (head).     multivitamin-lutein (OCUVITE-LUTEIN) CAPS capsule Take 1 capsule by mouth daily.     OVER THE COUNTER MEDICATION Place 1 spray into both nostrils 2 (two) times daily. Sinus saline     OVER THE COUNTER MEDICATION Take 15 mLs by mouth at bedtime. Yellow Mustard     polyethylene glycol powder (GLYCOLAX/MIRALAX) 17 GM/SCOOP powder Take 17 g by mouth 2 (two) times daily as needed for mild constipation, moderate constipation or severe constipation. 255 g 0   polyvinyl alcohol (ARTIFICIAL TEARS) 1.4 % ophthalmic solution Place 1 drop into both eyes daily as needed for dry eyes.     PREBIOTIC PRODUCT PO Take 1 tablet by mouth daily.     PRESCRIPTION MEDICATION CPAP- At bedtime and during any time of rest     rosuvastatin (CRESTOR) 5 MG tablet Take 1 tablet (5 mg total) by mouth daily. 90 tablet 1   saccharomyces boulardii (FLORASTOR) 250 MG capsule Take 1 capsule (250 mg total) by mouth 2  (two) times daily for 14 days. 28 capsule 0   senna-docusate (SENOKOT-S) 8.6-50 MG tablet Take 2 tablets by mouth 2 (two) times daily. 120 tablet 0   valsartan (DIOVAN) 320 MG tablet Take 1 tablet by mouth once daily 90 tablet 0   vitamin B-12 (CYANOCOBALAMIN) 500 MCG tablet Take 500 mcg by mouth in the morning.     albuterol (VENTOLIN HFA) 108 (90 Base) MCG/ACT inhaler INHALE 2 PUFFS INTO THE LUNGS EVERY 6 HOURS AS NEEDED FOR SHORTNESS OF BREATH. (Patient taking differently: Inhale 2 puffs into the lungs every 6 (six) hours as needed for shortness of breath.) 1 each 3   amLODipine (NORVASC) 2.5 MG tablet Take 1 tablet (2.5 mg total) by mouth 2 (two) times daily. HOLD AMLODIPINE DISE IF BLOOD PRESSURE LESS THAN 110/50 (Patient taking differently: Take 2.5 mg by  mouth daily. HOLD AMLODIPINE DISE IF BLOOD PRESSURE LESS THAN 110/50) 180 tablet 1   nitroGLYCERIN (NITROSTAT) 0.4 MG SL tablet Place 1 tablet (0.4 mg total) under the tongue every 5 (five) minutes as needed for chest pain. 25 tablet 6   No facility-administered medications prior to visit.    Allergies  Allergen Reactions   Pravastatin Other (See Comments)    Muscle cramps   Sildenafil Other (See Comments)    Tachycardia     Oxycodone Other (See Comments)    Hallucinations    Atenolol Other (See Comments)    Bradycardia    Metoclopramide Hcl Anxiety    ROS See HPI    Objective:    Physical Exam Constitutional:      General: He is not in acute distress.    Appearance: He is well-developed.  HENT:     Head: Normocephalic and atraumatic.  Cardiovascular:     Rate and Rhythm: Normal rate and regular rhythm.     Heart sounds: Murmur heard.  Pulmonary:     Effort: Pulmonary effort is normal. No respiratory distress.     Breath sounds: Normal breath sounds. No wheezing or rales.  Skin:    General: Skin is warm and dry.  Neurological:     Mental Status: He is alert and oriented to person, place, and time.  Psychiatric:         Behavior: Behavior normal.        Thought Content: Thought content normal.      BP (!) 160/65   Pulse 66   Temp 97.8 F (36.6 C) (Oral)   Resp 16   Ht 5\' 6"  (1.676 m)   Wt 163 lb (73.9 kg)   SpO2 99%   BMI 26.31 kg/m  Wt Readings from Last 3 Encounters:  09/26/23 163 lb (73.9 kg)  09/14/23 162 lb 7.7 oz (73.7 kg)  07/17/23 162 lb (73.5 kg)       Assessment & Plan:   Problem List Items Addressed This Visit       Unprioritized   Pneumonia of both lower lobes due to infectious organism   Clinically resolved. Completed augmentin.  Repeat cxr in 2-3 weeks.       Relevant Medications   albuterol (VENTOLIN HFA) 108 (90 Base) MCG/ACT inhaler   Other Relevant Orders   DG Chest 2 View   OSA (obstructive sleep apnea)   Reports good compliance with CPAP.       Hypothyroidism   Lab Results  Component Value Date   TSH 7.740 (H) 09/15/2023   Will increase to 150 mcg daily instead of daily xo 6 days and 75mg  one day a week.       HTN (hypertension)   Having some dizziness and low home readings.  With his falls,  I would prefer to let his bp run a bit high rather than too low. I advised him to hold amlodipine unless his bpis >160.      Relevant Medications   nitroGLYCERIN (NITROSTAT) 0.4 MG SL tablet   amLODipine (NORVASC) 2.5 MG tablet   Other Relevant Orders   Basic Metabolic Panel (BMET) (Completed)   Anemia - Primary   Relevant Orders   CBC w/Diff (Completed)    I have discontinued Breyon B. Pollan "[name]"'s amLODipine. I have also changed his albuterol. Additionally, I am having him start on amLODipine. Lastly, I am having him maintain his cycloSPORINE, aspirin EC, glucose blood, Lancets, Magnesium, PRESCRIPTION MEDICATION, vitamin B-12, loratadine,  antiseptic oral rinse, polyvinyl alcohol, Iron (Ferrous Sulfate), bacitracin, PREBIOTIC PRODUCT PO, acetaminophen, fluticasone, (Menthol, Topical Analgesic, (ICY HOT EX)), metroNIDAZOLE, OVER THE  COUNTER MEDICATION, cholestyramine, OVER THE COUNTER MEDICATION, multivitamin-lutein, polyethylene glycol powder, levothyroxine, hydrALAZINE, gabapentin, allopurinol, rosuvastatin, levothyroxine, valsartan, saccharomyces boulardii, senna-docusate, and nitroGLYCERIN.  Meds ordered this encounter  Medications   albuterol (VENTOLIN HFA) 108 (90 Base) MCG/ACT inhaler    Sig: Inhale 2 puffs into the lungs every 6 (six) hours as needed for wheezing or shortness of breath. INHALE 2 PUFFS INTO THE LUNGS EVERY 6 HOURS AS NEEDED FOR SHORTNESS OF BREATH.    Dispense:  1 each    Refill:  3    Supervising Provider:   Danise Edge A [[id number]]   nitroGLYCERIN (NITROSTAT) 0.4 MG SL tablet    Sig: Place 1 tablet (0.4 mg total) under the tongue every 5 (five) minutes as needed for chest pain.    Dispense:  25 tablet    Refill:  6    Supervising Provider:   Danise Edge A [[id number]]   amLODipine (NORVASC) 2.5 MG tablet    Sig: Take 1 tablet (2.5 mg total) by mouth daily as needed (for blood pressure >160).    Supervising Provider:   Danise Edge A [phone]

## 2023-09-27 ENCOUNTER — Ambulatory Visit (HOSPITAL_BASED_OUTPATIENT_CLINIC_OR_DEPARTMENT_OTHER)
Admission: RE | Admit: 2023-09-27 | Discharge: 2023-09-27 | Disposition: A | Discharge status: Home / Self Care | Payer: Medicare HMO | Source: Ambulatory Visit | Attending: Family | Admitting: Family

## 2023-09-27 ENCOUNTER — Telehealth: Payer: Self-pay | Admitting: Family

## 2023-09-27 DIAGNOSIS — K449 Diaphragmatic hernia without obstruction or gangrene: Secondary | ICD-10-CM | POA: Diagnosis not present

## 2023-09-27 DIAGNOSIS — I129 Hypertensive chronic kidney disease with stage 1 through stage 4 chronic kidney disease, or unspecified chronic kidney disease: Secondary | ICD-10-CM | POA: Diagnosis not present

## 2023-09-27 DIAGNOSIS — D631 Anemia in chronic kidney disease: Secondary | ICD-10-CM | POA: Diagnosis not present

## 2023-09-27 DIAGNOSIS — J189 Pneumonia, unspecified organism: Secondary | ICD-10-CM | POA: Insufficient documentation

## 2023-09-27 DIAGNOSIS — R918 Other nonspecific abnormal finding of lung field: Secondary | ICD-10-CM | POA: Diagnosis not present

## 2023-09-27 DIAGNOSIS — I080 Rheumatic disorders of both mitral and aortic valves: Secondary | ICD-10-CM | POA: Diagnosis not present

## 2023-09-27 DIAGNOSIS — J439 Emphysema, unspecified: Secondary | ICD-10-CM | POA: Diagnosis not present

## 2023-09-27 DIAGNOSIS — J9601 Acute respiratory failure with hypoxia: Secondary | ICD-10-CM | POA: Diagnosis not present

## 2023-09-27 DIAGNOSIS — J690 Pneumonitis due to inhalation of food and vomit: Secondary | ICD-10-CM | POA: Diagnosis not present

## 2023-09-27 DIAGNOSIS — G629 Polyneuropathy, unspecified: Secondary | ICD-10-CM | POA: Diagnosis not present

## 2023-09-27 DIAGNOSIS — N1831 Chronic kidney disease, stage 3a: Secondary | ICD-10-CM | POA: Diagnosis not present

## 2023-09-27 DIAGNOSIS — I700 Atherosclerosis of aorta: Secondary | ICD-10-CM | POA: Diagnosis not present

## 2023-09-27 HISTORY — DX: Pneumonia, unspecified organism: J18.9

## 2023-09-27 LAB — CBC WITH DIFFERENTIAL/PLATELET
Basophils Absolute: 0.1 10*3/uL (ref 0.0–0.1)
Basophils Relative: 1.1 % (ref 0.0–3.0)
Eosinophils Absolute: 0.3 10*3/uL (ref 0.0–0.7)
Eosinophils Relative: 2.9 % (ref 0.0–5.0)
HCT: 36.1 % — ABNORMAL LOW (ref 39.0–52.0)
Hemoglobin: 11.8 g/dL — ABNORMAL LOW (ref 13.0–17.0)
Lymphocytes Relative: 17.4 % (ref 12.0–46.0)
Lymphs Abs: 1.6 10*3/uL (ref 0.7–4.0)
MCHC: 32.7 g/dL (ref 30.0–36.0)
MCV: 100.5 fL — ABNORMAL HIGH (ref 78.0–100.0)
Monocytes Absolute: 0.8 10*3/uL (ref 0.1–1.0)
Monocytes Relative: 8.7 % (ref 3.0–12.0)
Neutro Abs: 6.6 10*3/uL (ref 1.4–7.7)
Neutrophils Relative %: 69.9 % (ref 43.0–77.0)
Platelets: 424 10*3/uL — ABNORMAL HIGH (ref 150.0–400.0)
RBC: 3.59 Mil/uL — ABNORMAL LOW (ref 4.22–5.81)
RDW: 13.6 % (ref 11.5–15.5)
WBC: 9.5 10*3/uL (ref 4.0–10.5)

## 2023-09-27 LAB — BASIC METABOLIC PANEL
BUN: 26 mg/dL — ABNORMAL HIGH (ref 6–23)
CO2: 28 meq/L (ref 19–32)
Calcium: 9.1 mg/dL (ref 8.4–10.5)
Chloride: 104 meq/L (ref 96–112)
Creatinine, Ser: 1.36 mg/dL (ref 0.40–1.50)
GFR: 48.09 mL/min — ABNORMAL LOW (ref >60.00)
Glucose, Bld: 126 mg/dL — ABNORMAL HIGH (ref 70–99)
Potassium: 3.8 meq/L (ref 3.5–5.1)
Sodium: 143 meq/L (ref 135–145)

## 2023-09-27 MED ORDER — LEVOTHYROXINE SODIUM 150 MCG PO TABS: 150.0000 ug | ORAL_TABLET | Freq: Every day | ORAL | Status: DC

## 2023-09-27 MED ORDER — AMLODIPINE BESYLATE 2.5 MG PO TABS: 2.5000 mg | ORAL_TABLET | Freq: Every day | ORAL | Status: DC | PRN

## 2023-09-27 NOTE — Telephone Encounter (Signed)
Patient notified of medication dose increase. He verbalize understanding. He will set up lab appointment for TSH when he comes in for appointment next month.

## 2023-09-27 NOTE — Assessment & Plan Note (Signed)
Having some dizziness and low home readings.  With his falls,  I would prefer to let his bp run a bit high rather than too low. I advised him to hold amlodipine unless his bpis >160.

## 2023-09-27 NOTE — Telephone Encounter (Signed)
Please advise pt that I was looking more closely at his thyroid medication.  It looks like he is taking synthroid 150 mcg daily excep on Sundays.  Let's change him to 150 mcg once daily.  Repeat tsh in 6 weeks dx hypothyroid.

## 2023-09-27 NOTE — Assessment & Plan Note (Signed)
Lab Results  Component Value Date   TSH 7.740 (H) 09/15/2023   Will increase to 150 mcg daily instead of daily xo 6 days and 75mg  one day a week.

## 2023-09-27 NOTE — Assessment & Plan Note (Signed)
Clinically resolved. Completed augmentin.  Repeat cxr in 2-3 weeks.

## 2023-10-01 ENCOUNTER — Other Ambulatory Visit: Payer: Self-pay | Admitting: Family

## 2023-10-01 DIAGNOSIS — J9601 Acute respiratory failure with hypoxia: Secondary | ICD-10-CM | POA: Diagnosis not present

## 2023-10-01 DIAGNOSIS — I700 Atherosclerosis of aorta: Secondary | ICD-10-CM | POA: Diagnosis not present

## 2023-10-01 DIAGNOSIS — D631 Anemia in chronic kidney disease: Secondary | ICD-10-CM | POA: Diagnosis not present

## 2023-10-01 DIAGNOSIS — J690 Pneumonitis due to inhalation of food and vomit: Secondary | ICD-10-CM | POA: Diagnosis not present

## 2023-10-01 DIAGNOSIS — I080 Rheumatic disorders of both mitral and aortic valves: Secondary | ICD-10-CM | POA: Diagnosis not present

## 2023-10-01 DIAGNOSIS — J439 Emphysema, unspecified: Secondary | ICD-10-CM | POA: Diagnosis not present

## 2023-10-01 DIAGNOSIS — N1831 Chronic kidney disease, stage 3a: Secondary | ICD-10-CM | POA: Diagnosis not present

## 2023-10-01 DIAGNOSIS — G629 Polyneuropathy, unspecified: Secondary | ICD-10-CM | POA: Diagnosis not present

## 2023-10-01 DIAGNOSIS — I129 Hypertensive chronic kidney disease with stage 1 through stage 4 chronic kidney disease, or unspecified chronic kidney disease: Secondary | ICD-10-CM | POA: Diagnosis not present

## 2023-10-01 NOTE — Telephone Encounter (Signed)
Copied from CRM [id number]. Topic: Clinical - Medication Refill >> Oct 01, 2023  3:52 PM Marica Otter wrote: Most Recent Primary Care Visit:  Provider: Peggyann Juba, MELISSA  Department: [facility]  Visit Type: HOSPITAL FU  Date: 09/26/2023  Medication: rosuvastatin (CRESTOR) 5 MG tablet  Has the patient contacted their pharmacy? No (Agent: If no, request that the patient contact the pharmacy for the refill. If patient does not wish to contact the pharmacy document the reason why and proceed with request.) (Agent: If yes, when and what did the pharmacy advise?)  Is this the correct pharmacy for this prescription? Yes If no, delete pharmacy and type the correct one.  This is the patient's preferred pharmacy:  [facility] [phone]  Has the prescription been filled recently? No  Is the patient out of the medication? No  Has the patient been seen for an appointment in the last year OR does the patient have an upcoming appointment? Yes  Can we respond through MyChart? Yes  Agent: Please be advised that Rx refills may take up to 3 business days. We ask that you follow-up with your pharmacy.

## 2023-10-01 NOTE — Telephone Encounter (Signed)
Copied from CRM [id number]. Topic: Clinical - Medication Refill >> Oct 01, 2023  3:52 PM Marica Otter wrote: Most Recent Primary Care Visit:  Provider: Peggyann Juba, MELISSA  Department: [facility]  Visit Type: HOSPITAL FU  Date: 09/26/2023  Medication: ***  Has the patient contacted their pharmacy?  (Agent: If no, request that the patient contact the pharmacy for the refill. If patient does not wish to contact the pharmacy document the reason why and proceed with request.) (Agent: If yes, when and what did the pharmacy advise?)  Is this the correct pharmacy for this prescription?  If no, delete pharmacy and type the correct one.  This is the patient's preferred pharmacy:  [id number] - [place], [place] - [address] [place] [address] Phone: [phone] Fax: [phone]   Has the prescription been filled recently?   Is the patient out of the medication?   Has the patient been seen for an appointment in the last year OR does the patient have an upcoming appointment?   Can we respond through MyChart?   Agent: Please be advised that Rx refills may take up to 3 business days. We ask that you follow-up with your pharmacy.

## 2023-10-01 NOTE — Telephone Encounter (Signed)
Duplicate request. Refill request routed to clinic earlier this afternoon. See recent encounters.   Copied from CRM [id number]. Topic: Clinical - Prescription Issue >> Oct 01, 2023  4:01 PM Gurney Maxin H wrote: Reason for CRM: Patients previous medication refill for his levothyroxine (SYNTHROID) 150 MCG tablet  was being sent to [facility] the patient wants to use the [facility] pharmacy for a 90 day supply patient didn't have an address but gave me phone number [phone]

## 2023-10-03 DIAGNOSIS — N1831 Chronic kidney disease, stage 3a: Secondary | ICD-10-CM | POA: Diagnosis not present

## 2023-10-03 DIAGNOSIS — G629 Polyneuropathy, unspecified: Secondary | ICD-10-CM | POA: Diagnosis not present

## 2023-10-03 DIAGNOSIS — J9601 Acute respiratory failure with hypoxia: Secondary | ICD-10-CM | POA: Diagnosis not present

## 2023-10-03 DIAGNOSIS — D631 Anemia in chronic kidney disease: Secondary | ICD-10-CM | POA: Diagnosis not present

## 2023-10-03 DIAGNOSIS — J690 Pneumonitis due to inhalation of food and vomit: Secondary | ICD-10-CM | POA: Diagnosis not present

## 2023-10-03 DIAGNOSIS — I129 Hypertensive chronic kidney disease with stage 1 through stage 4 chronic kidney disease, or unspecified chronic kidney disease: Secondary | ICD-10-CM | POA: Diagnosis not present

## 2023-10-03 DIAGNOSIS — I700 Atherosclerosis of aorta: Secondary | ICD-10-CM | POA: Diagnosis not present

## 2023-10-03 DIAGNOSIS — I080 Rheumatic disorders of both mitral and aortic valves: Secondary | ICD-10-CM | POA: Diagnosis not present

## 2023-10-03 DIAGNOSIS — J439 Emphysema, unspecified: Secondary | ICD-10-CM | POA: Diagnosis not present

## 2023-10-08 DIAGNOSIS — I080 Rheumatic disorders of both mitral and aortic valves: Secondary | ICD-10-CM | POA: Diagnosis not present

## 2023-10-08 DIAGNOSIS — J9601 Acute respiratory failure with hypoxia: Secondary | ICD-10-CM | POA: Diagnosis not present

## 2023-10-08 DIAGNOSIS — G629 Polyneuropathy, unspecified: Secondary | ICD-10-CM | POA: Diagnosis not present

## 2023-10-08 DIAGNOSIS — J9811 Atelectasis: Secondary | ICD-10-CM

## 2023-10-08 DIAGNOSIS — I700 Atherosclerosis of aorta: Secondary | ICD-10-CM | POA: Diagnosis not present

## 2023-10-08 DIAGNOSIS — R011 Cardiac murmur, unspecified: Secondary | ICD-10-CM

## 2023-10-08 DIAGNOSIS — N1831 Chronic kidney disease, stage 3a: Secondary | ICD-10-CM | POA: Diagnosis not present

## 2023-10-08 DIAGNOSIS — J439 Emphysema, unspecified: Secondary | ICD-10-CM | POA: Diagnosis not present

## 2023-10-08 DIAGNOSIS — I129 Hypertensive chronic kidney disease with stage 1 through stage 4 chronic kidney disease, or unspecified chronic kidney disease: Secondary | ICD-10-CM | POA: Diagnosis not present

## 2023-10-08 DIAGNOSIS — D631 Anemia in chronic kidney disease: Secondary | ICD-10-CM | POA: Diagnosis not present

## 2023-10-08 DIAGNOSIS — J690 Pneumonitis due to inhalation of food and vomit: Secondary | ICD-10-CM | POA: Diagnosis not present

## 2023-10-08 DIAGNOSIS — I6521 Occlusion and stenosis of right carotid artery: Secondary | ICD-10-CM

## 2023-10-09 DIAGNOSIS — M48062 Spinal stenosis, lumbar region with neurogenic claudication: Secondary | ICD-10-CM | POA: Diagnosis not present

## 2023-10-10 ENCOUNTER — Other Ambulatory Visit: Payer: Self-pay | Admitting: Family

## 2023-10-10 DIAGNOSIS — J690 Pneumonitis due to inhalation of food and vomit: Secondary | ICD-10-CM | POA: Diagnosis not present

## 2023-10-10 DIAGNOSIS — I080 Rheumatic disorders of both mitral and aortic valves: Secondary | ICD-10-CM | POA: Diagnosis not present

## 2023-10-10 DIAGNOSIS — I700 Atherosclerosis of aorta: Secondary | ICD-10-CM | POA: Diagnosis not present

## 2023-10-10 DIAGNOSIS — G629 Polyneuropathy, unspecified: Secondary | ICD-10-CM | POA: Diagnosis not present

## 2023-10-10 DIAGNOSIS — I129 Hypertensive chronic kidney disease with stage 1 through stage 4 chronic kidney disease, or unspecified chronic kidney disease: Secondary | ICD-10-CM | POA: Diagnosis not present

## 2023-10-10 DIAGNOSIS — I10 Essential (primary) hypertension: Secondary | ICD-10-CM

## 2023-10-10 DIAGNOSIS — D631 Anemia in chronic kidney disease: Secondary | ICD-10-CM | POA: Diagnosis not present

## 2023-10-10 DIAGNOSIS — J9601 Acute respiratory failure with hypoxia: Secondary | ICD-10-CM | POA: Diagnosis not present

## 2023-10-10 DIAGNOSIS — J439 Emphysema, unspecified: Secondary | ICD-10-CM | POA: Diagnosis not present

## 2023-10-10 DIAGNOSIS — E039 Hypothyroidism, unspecified: Secondary | ICD-10-CM

## 2023-10-10 DIAGNOSIS — N1831 Chronic kidney disease, stage 3a: Secondary | ICD-10-CM | POA: Diagnosis not present

## 2023-10-10 MED ORDER — HYDRALAZINE HCL 50 MG PO TABS: 50.0000 mg | ORAL_TABLET | Freq: Three times a day (TID) | ORAL | 0 refills | Status: DC

## 2023-10-10 MED ORDER — LEVOTHYROXINE SODIUM 150 MCG PO TABS: 150.0000 ug | ORAL_TABLET | Freq: Every day | ORAL | 0 refills | Status: DC

## 2023-10-10 MED ORDER — AMLODIPINE BESYLATE 2.5 MG PO TABS: 2.5000 mg | ORAL_TABLET | Freq: Every day | ORAL | 0 refills | Status: DC | PRN

## 2023-10-10 NOTE — Telephone Encounter (Signed)
Copied from CRM [id number]. Topic: Clinical - Medication Refill >> Oct 10, 2023  3:56 PM Ernst Spell wrote: Most Recent Primary Care Visit:  Provider: Peggyann Juba, MELISSA  Department: [facility]  Visit Type: HOSPITAL FU  Date: 09/26/2023  Medication:   Has the patient contacted their pharmacy? Yes Pharmacy told him to contact his doctor.  Is this the correct pharmacy for this prescription? Yes If no, delete pharmacy and type the correct one.  This is the patient's preferred pharmacy:  [id number] - [place], [place] - [address] [place] [address] Phone: [phone] Fax: [phone]   Has the prescription been filled recently? Yes  Is the patient out of the medication? No  Has the patient been seen for an appointment in the last year OR does the patient have an upcoming appointment? Yes  Can we respond through MyChart? No, pt needs to be contacted via telephone.  Agent: Please be advised that Rx refills may take up to 3 business days. We ask that you follow-up with your pharmacy.

## 2023-10-16 DIAGNOSIS — I080 Rheumatic disorders of both mitral and aortic valves: Secondary | ICD-10-CM | POA: Diagnosis not present

## 2023-10-16 DIAGNOSIS — J9601 Acute respiratory failure with hypoxia: Secondary | ICD-10-CM | POA: Diagnosis not present

## 2023-10-16 DIAGNOSIS — I700 Atherosclerosis of aorta: Secondary | ICD-10-CM | POA: Diagnosis not present

## 2023-10-16 DIAGNOSIS — N1831 Chronic kidney disease, stage 3a: Secondary | ICD-10-CM | POA: Diagnosis not present

## 2023-10-16 DIAGNOSIS — D631 Anemia in chronic kidney disease: Secondary | ICD-10-CM | POA: Diagnosis not present

## 2023-10-16 DIAGNOSIS — I129 Hypertensive chronic kidney disease with stage 1 through stage 4 chronic kidney disease, or unspecified chronic kidney disease: Secondary | ICD-10-CM | POA: Diagnosis not present

## 2023-10-16 DIAGNOSIS — J690 Pneumonitis due to inhalation of food and vomit: Secondary | ICD-10-CM | POA: Diagnosis not present

## 2023-10-16 DIAGNOSIS — G629 Polyneuropathy, unspecified: Secondary | ICD-10-CM | POA: Diagnosis not present

## 2023-10-16 DIAGNOSIS — J439 Emphysema, unspecified: Secondary | ICD-10-CM | POA: Diagnosis not present

## 2023-10-18 DIAGNOSIS — R350 Frequency of micturition: Secondary | ICD-10-CM | POA: Diagnosis not present

## 2023-10-18 DIAGNOSIS — N3941 Urge incontinence: Secondary | ICD-10-CM | POA: Diagnosis not present

## 2023-10-24 ENCOUNTER — Ambulatory Visit (INDEPENDENT_AMBULATORY_CARE_PROVIDER_SITE_OTHER): Payer: Medicare HMO | Admitting: Family

## 2023-10-24 VITALS — BP 145/60 | HR 69 | Temp 97.8°F | Resp 16 | Ht 66.0 in | Wt 160.0 lb

## 2023-10-24 DIAGNOSIS — E785 Hyperlipidemia, unspecified: Secondary | ICD-10-CM | POA: Diagnosis not present

## 2023-10-24 DIAGNOSIS — I10 Essential (primary) hypertension: Secondary | ICD-10-CM | POA: Diagnosis not present

## 2023-10-24 DIAGNOSIS — R739 Hyperglycemia, unspecified: Secondary | ICD-10-CM | POA: Diagnosis not present

## 2023-10-24 DIAGNOSIS — G6281 Critical illness polyneuropathy: Secondary | ICD-10-CM | POA: Diagnosis not present

## 2023-10-24 DIAGNOSIS — G2581 Restless legs syndrome: Secondary | ICD-10-CM | POA: Diagnosis not present

## 2023-10-24 DIAGNOSIS — G4733 Obstructive sleep apnea (adult) (pediatric): Secondary | ICD-10-CM

## 2023-10-24 DIAGNOSIS — E039 Hypothyroidism, unspecified: Secondary | ICD-10-CM

## 2023-10-24 MED ORDER — VALSARTAN 320 MG PO TABS: 320.0000 mg | ORAL_TABLET | Freq: Every day | ORAL | 1 refills | Status: DC

## 2023-10-24 NOTE — Assessment & Plan Note (Signed)
>>  ASSESSMENT AND PLAN FOR OSA (OBSTRUCTIVE SLEEP APNEA) WRITTEN ON 10/24/2023  2:43 PM BY O'SULLIVAN, Maia Handa, NP  Reports good compliance with cpap.

## 2023-10-24 NOTE — Assessment & Plan Note (Signed)
Stable with HS gabapentin.

## 2023-10-24 NOTE — Progress Notes (Signed)
Subjective:     Patient ID: [name] Pratt, male    DOB: Oct 08, 1940, 83 y.o.   MRN: [id number]  Chief Complaint  Patient presents with   Hypertension    Here for follow up   Hypothyroidism    Here for follow up    Hypertension    Discussed the use of AI scribe software for clinical note transcription with the patient, who gave verbal consent to proceed.  History of Present Illness   [name] FOLMAR "[name]" is an 83 year old male with hypertension and hypothyroidism who presents for medication management and follow-up.  He is on multiple medications for hypertension, including amlodipine 2.5 mg, hydralazine 50 mg, and valsartan, which he is running low on. His blood pressure reading this morning was 150/70 mmHg. No lightheadedness, but he notes slight balance issues if he doesn't keep his eyes open.  His thyroid medication dose was adjusted to 150 mcg about a month ago, which he takes every morning. He reports discomfort in his throat and behind his ear, which he associates with the thyroid issue.  He uses a CPAP machine for sleep apnea but notes issues with the headgear popping loose, which disrupts his sleep. He is compliant with CPAP use despite these issues.  He takes gabapentin at bedtime for his neuropathy which is helpful.  He mentions experiencing cramps in his legs last night, but denies current restless leg symptoms. He associates these cramps with his back issues, for which he has previously received injections that provided relief for about three days.  He mentions a burning sensation from his shoulder down to his wrist, which he believes may be related to his neck. The burning stops when he keeps his arm back.  He has completed home therapy, which he found beneficial, particularly for exercises and walking. He continues to exercise by climbing stairs and using a walking cane.          Health Maintenance Due  Topic Date Due   FOOT EXAM  09/29/2021    Medicare Annual Wellness (AWV)  01/20/2023   COVID-19 Vaccine (6 - 2024-25 season) 09/11/2023    Past Medical History:  Diagnosis Date   Allergic rhinitis 02/02/2016   Anemia 12/17/2009   Formatting of this note might be different from the original. 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 Hypertension   Benign paroxysmal positional vertigo 09/14/2021   Bilateral sacroiliitis (HCC) 07/22/2021   Body mass index (BMI) 25.0-25.9, adult 11/02/2020   Cancer (HCC)    skin cancer   Cardiac murmur 07/25/2021   Carotid stenosis    a. Carotid U/S 5/13: LICA < 50%, RICA 50-69%;  b.  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   Cystoid macular edema of both eyes 07/28/2020   Degenerative disc disease, lumbar 06/30/2015   Deviated nasal septum 02/18/2015  Diverticulosis 03/03/2020   DJD (degenerative joint disease) of hip 12/24/2012   Dyspnea 06/12/2011   Emphysema, unspecified (HCC) 03/05/2020   Erectile dysfunction 06/20/2017   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   Gait disorder 03/02/2021   GERD (gastroesophageal reflux disease)    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   Qualifier: Diagnosis of  By: Kem Parkinson     Greater trochanteric pain syndrome 07/22/2021   Hand pain, left 01/16/2022   Hearing  loss 02/02/2016   Heme positive stool 02/29/2020   HIATAL HERNIA 05/13/2009   Qualifier: Diagnosis of  By: Kem Parkinson     High risk medication use 01/18/2012   Hip pain 03/02/2021   History of chicken pox    History of COVID-19 12/05/2021   History of hemorrhoids    History of kidney stones    History of skin cancer 07/22/2021   skin cancer   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 Osteoarthritis Of The Hand  10/1 IMO update   Osteoarthrosis, unspecified whether generalized or localized, pelvic region and thigh 07/25/2010   Formatting of this note might be different from the original. Osteoarthritis Of The Hip   Other abnormal glucose 07/22/2021   Other allergic rhinitis 02/18/2015   Palpitations 07/25/2021   Peripheral neuropathy 06/19/2016   Preventative health care 11/24/2015   Right groin pain 11/29/2012   Right inguinal hernia 05/13/2009   Qualifier: Diagnosis of  By: Kem Parkinson     RLS (restless legs syndrome) 02/18/2015   Rosacea    Rotator cuff tear 07/27/2013   Situational depression 03/02/2021   Skin lesion 01/16/2022   Sleep apnea with use of continuous  positive airway pressure (CPAP) 07/15/2013   CPAP set to  7 cm water,  Residual AHi was 7.8  With no leak.  User time 6 hours.  479 days , not used only 12 days - highly compliant 06-23-13  .    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 Fawn Kirk   Ulnar neuropathy 01/24/2022   Urinary retention 11/26/2020   Ventral hernia 07/08/2012   Vertigo 07/08/2021   Weight loss 03/03/2020    Past Surgical History:  Procedure Laterality Date   ABDOMINAL HERNIA REPAIR  07/15/12   Dr Ashley Jacobs   ANTERIOR CERVICAL DECOMP/DISCECTOMY FUSION N/A 07/09/2020   Procedure: ANTERIOR CERVICAL DECOMPRESSION AND FUSION CERVICAL THREE-FOUR.;  Surgeon: Donalee Citrin, MD;  Location: [facility]  OR;  Service: Neurosurgery;  Laterality: N/A;  anterior   BROW LIFT  05/07/01   COLONOSCOPY WITH PROPOFOL N/A 04/24/2022   Procedure: COLONOSCOPY WITH PROPOFOL;  Surgeon: Tressia Danas, MD;  Location: [facility] ENDOSCOPY;  Service: Gastroenterology;  Laterality: N/A;   COLONOSCOPY WITH PROPOFOL N/A 04/26/2022   Procedure: COLONOSCOPY WITH PROPOFOL;  Surgeon: Tressia Danas, MD;  Location: [facility] ENDOSCOPY;  Service: Gastroenterology;  Laterality: N/A;   CYSTOSCOPY WITH URETEROSCOPY AND STENT PLACEMENT Right 01/28/2014   Procedure: CYSTOSCOPY WITH URETEROSCOPY, BASKET RETRIVAL AND  STENT PLACEMENT;  Surgeon: Valetta Fuller, MD;  Location: [facility] ORS;  Service: Urology;  Laterality: Right;   epidural injections     multiple procedures   ESOPHAGEAL DILATION  1993 and 1994   multiple times   EYE SURGERY Bilateral 03/25/10, 2012   cataract removal   HEMORRHOID SURGERY  2014   HIATAL HERNIA REPAIR  12/21/93   HOLMIUM LASER APPLICATION Right 01/28/2014   Procedure:  HOLMIUM LASER APPLICATION;  Surgeon: Valetta Fuller, MD;  Location: [facility] ORS;  Service: Urology;  Laterality: Right;   INGUINAL HERNIA REPAIR Right 07/15/12   Dr Ashley Jacobs, x2   JOINT REPLACEMENT  07/25/04   right knee   JOINT REPLACEMENT  07/13/10   left knee   KNEE SURGERY  1995   LAPAROSCOPIC CHOLECYSTECTOMY  09/20/06   with intraoperative cholangiogram and right inguinal herniorrhaphy with mesh    LIGAMENT REPAIR Left 07/2013   shoulder   LITHOTRIPSY     x2   LUMBAR LAMINECTOMY/DECOMPRESSION MICRODISCECTOMY Bilateral 12/27/2022   Procedure: Lumbar Two-Three, Lumbar Three-Four Sublaminar Decompression;  Surgeon: Donalee Citrin, MD;  Location: [facility] OR;  Service: Neurosurgery;  Laterality: Bilateral;   NASAL SEPTUM SURGERY  1975   POLYPECTOMY  04/26/2022   Procedure: POLYPECTOMY;  Surgeon: Tressia Danas, MD;  Location: [facility] ENDOSCOPY;  Service: Gastroenterology;;   POSTERIOR CERVICAL FUSION/FORAMINOTOMY N/A 11/24/2020   Procedure: Posterior Cervical Laminectomy Cervical three-four, Cervical four-five with lateral mass fusion;  Surgeon: Donalee Citrin, MD;  Location: [facility] OR;  Service: Neurosurgery;  Laterality: N/A;   REFRACTIVE SURGERY Bilateral    Right total hip replacement  4/14   SKIN CANCER DESTRUCTION     nose, ear   TONSILLECTOMY  as child   TOTAL HIP ARTHROPLASTY Left    URETHRAL DILATION  1991   Dr. Annabell Howells    Family History  Problem Relation Age of Onset   Cancer Mother    Hypertension Other    Cancer Other    Osteoarthritis Other    Stomach cancer Neg Hx    Colon cancer Neg Hx    Esophageal cancer Neg Hx     Social History   Socioeconomic History   Marital status: Married    Spouse name: [name] Pratt   Number of children: 1   Years of education: Not on file   Highest education level: Not on file  Occupational History   Occupation: firefighter    Comment: paints on the side   Occupation: retired  Tobacco Use   Smoking status: Former    Current packs/day: 0.00    Types:  Cigarettes    Start date: 09/12/1967    Quit date: 09/11/1978    Years since quitting: 45.1   Smokeless tobacco: Never  Vaping Use   Vaping status: Never Used  Substance and Sexual Activity   Alcohol use: Not Currently    Alcohol/week: 0.0 standard drinks of alcohol    Comment: rare   Drug use: No   Sexual  activity: Not Currently  Other Topics Concern   Not on file  Social History Narrative   Lives with his wife   He has one son- lives locally.   2 grandchildren   Has worked for Warden/ranger   Enjoys wood working   Completed HS.  [employer]   Social Drivers of Corporate investment banker Strain: Low Risk  (10/28/2021)   Overall Financial Resource Strain (CARDIA)    Difficulty of Paying Living Expenses: Not very hard  Food Insecurity: No Food Insecurity (09/18/2023)   Hunger Vital Sign    Worried About Running Out of Food in the Last Year: Never true    Ran Out of Food in the Last Year: Never true  Transportation Needs: No Transportation Needs (09/18/2023)   PRAPARE - Administrator, Civil Service (Medical): No    Lack of Transportation (Non-Medical): No  Physical Activity: Inactive (09/03/2023)   Exercise Vital Sign    Days of Exercise per Week: 0 days    Minutes of Exercise per Session: 0 min  Stress: Stress Concern Present (09/03/2023)   [employer] of Occupational Health - Occupational Stress Questionnaire    Feeling of Stress : To some extent  Social Connections: Moderately Isolated (09/14/2023)   Social Connection and Isolation Panel [NHANES]    Frequency of Communication with Friends and Family: Three times a week    Frequency of Social Gatherings with Friends and Family: Three times a week    Attends Religious Services: Never    Active Member of Clubs or Organizations: No    Attends Banker Meetings: Never    Marital Status: Married  Catering manager Violence: Not At Risk (09/18/2023)   Humiliation, Afraid, Rape, and Kick questionnaire     Fear of Current or Ex-Partner: No    Emotionally Abused: No    Physically Abused: No    Sexually Abused: No    Outpatient Medications Prior to Visit  Medication Sig Dispense Refill   acetaminophen (TYLENOL) 500 MG tablet Take 500 mg by mouth at bedtime as needed for moderate pain (pain score 4-6).     albuterol (VENTOLIN HFA) 108 (90 Base) MCG/ACT inhaler Inhale 2 puffs into the lungs every 6 (six) hours as needed for wheezing or shortness of breath. INHALE 2 PUFFS INTO THE LUNGS EVERY 6 HOURS AS NEEDED FOR SHORTNESS OF BREATH. 1 each 3   allopurinol (ZYLOPRIM) 100 MG tablet Take 1 tablet (100 mg total) by mouth 2 (two) times daily. 180 tablet 1   amLODipine (NORVASC) 2.5 MG tablet Take 1 tablet (2.5 mg total) by mouth daily as needed (for blood pressure >160). 90 tablet 0   antiseptic oral rinse (BIOTENE) LIQD 15 mLs by Mouth Rinse route 2 (two) times daily.     aspirin EC 81 MG tablet Take 81 mg by mouth at bedtime.     bacitracin ointment Apply 1 Application topically 2 (two) times daily. (Patient taking differently: Apply 1 Application topically daily as needed for wound care.) 120 g 0   cholestyramine (QUESTRAN) 4 GM/DOSE powder Take 4 g by mouth as needed (digestion). 30 ml     cycloSPORINE (RESTASIS) 0.05 % ophthalmic emulsion Place 1 drop into both eyes 2 (two) times daily.     fluticasone (FLONASE) 50 MCG/ACT nasal spray Place 1 spray into both nostrils at bedtime.     gabapentin (NEURONTIN) 300 MG capsule Take 1 capsule (300 mg total) by mouth at bedtime. 90 capsule 1  glucose blood test strip TRUE Metrix Blood Glucose Test Strip, use as instructed 100 each 12   hydrALAZINE (APRESOLINE) 50 MG tablet Take 1 tablet (50 mg total) by mouth 3 (three) times daily. 270 tablet 0   Iron, Ferrous Sulfate, 325 (65 Fe) MG TABS Take 325 mg by mouth every other day. (Patient taking differently: Take 325 mg by mouth daily.) 30 tablet 0   Lancets MISC 1 each by Does not apply route 2 (two) times  daily. TRUE matrix lancets 100 each 3   levothyroxine (SYNTHROID) 150 MCG tablet Take 1 tablet (150 mcg total) by mouth daily before breakfast. 90 tablet 0   loratadine (CLARITIN) 10 MG tablet Take 10 mg by mouth daily as needed for allergies or rhinitis.     Magnesium 500 MG TABS Take 500 mg by mouth daily.     Menthol, Topical Analgesic, (ICY HOT EX) Apply 1 Application topically at bedtime. Lidocaine / on back     metroNIDAZOLE (METROCREAM) 0.75 % cream Apply 1 Application topically daily as needed (head).     multivitamin-lutein (OCUVITE-LUTEIN) CAPS capsule Take 1 capsule by mouth daily.     nitroGLYCERIN (NITROSTAT) 0.4 MG SL tablet Place 1 tablet (0.4 mg total) under the tongue every 5 (five) minutes as needed for chest pain. 25 tablet 6   OVER THE COUNTER MEDICATION Place 1 spray into both nostrils 2 (two) times daily. Sinus saline     OVER THE COUNTER MEDICATION Take 15 mLs by mouth at bedtime. Yellow Mustard     polyethylene glycol powder (GLYCOLAX/MIRALAX) 17 GM/SCOOP powder Take 17 g by mouth 2 (two) times daily as needed for mild constipation, moderate constipation or severe constipation. 255 g 0   polyvinyl alcohol (ARTIFICIAL TEARS) 1.4 % ophthalmic solution Place 1 drop into both eyes daily as needed for dry eyes.     PREBIOTIC PRODUCT PO Take 1 tablet by mouth daily.     PRESCRIPTION MEDICATION CPAP- At bedtime and during any time of rest     rosuvastatin (CRESTOR) 5 MG tablet Take 1 tablet (5 mg total) by mouth daily. 90 tablet 1   senna-docusate (SENOKOT-S) 8.6-50 MG tablet Take 2 tablets by mouth 2 (two) times daily. 120 tablet 0   vitamin B-12 (CYANOCOBALAMIN) 500 MCG tablet Take 500 mcg by mouth in the morning.     valsartan (DIOVAN) 320 MG tablet Take 1 tablet by mouth once daily 90 tablet 0   No facility-administered medications prior to visit.    Allergies  Allergen Reactions   Pravastatin Other (See Comments)    Muscle cramps   Sildenafil Other (See Comments)     Tachycardia     Oxycodone Other (See Comments)    Hallucinations    Atenolol Other (See Comments)    Bradycardia    Metoclopramide Hcl Anxiety    ROS    See HPI Objective:    Physical Exam Constitutional:      General: He is not in acute distress.    Appearance: He is well-developed.  HENT:     Head: Normocephalic and atraumatic.  Cardiovascular:     Rate and Rhythm: Normal rate and regular rhythm.     Heart sounds: No murmur heard. Pulmonary:     Effort: Pulmonary effort is normal. No respiratory distress.     Breath sounds: Normal breath sounds. No wheezing or rales.  Skin:    General: Skin is warm and dry.  Neurological:     Mental Status: He is  alert and oriented to person, place, and time.  Psychiatric:        Behavior: Behavior normal.        Thought Content: Thought content normal.      BP (!) 145/60   Pulse 69   Temp 97.8 F (36.6 C) (Oral)   Resp 16   Ht 5\' 6"  (1.676 m)   Wt 160 lb (72.6 kg)   SpO2 97%   BMI 25.82 kg/m  Wt Readings from Last 3 Encounters:  10/24/23 160 lb (72.6 kg)  09/26/23 163 lb (73.9 kg)  09/14/23 162 lb 7.7 oz (73.7 kg)       Assessment & Plan:   Problem List Items Addressed This Visit       Unprioritized   RLS (restless legs syndrome) - Primary   No issues recently, just gets cramping.       Peripheral neuropathy   Stable with HS gabapentin.       OSA (obstructive sleep apnea)   Reports good compliance with cpap.       Hypothyroidism   Lab Results  Component Value Date   TSH 7.740 (H) 09/15/2023   Recently adjusted dose.  Continue synthroid, update TSH.       Relevant Orders   TSH   Hyperlipidemia   Lab Results  Component Value Date   CHOL 125 01/22/2023   HDL 44.70 01/22/2023   LDLCALC 55 01/22/2023   TRIG 125.0 01/22/2023   CHOLHDL 3 01/22/2023   Last lipids good.  Continue crestor 5mg .        Relevant Medications   valsartan (DIOVAN) 320 MG tablet   Hyperglycemia   Lab Results   Component Value Date   HGBA1C 5.6 01/22/2023          I have changed [name] B. Matto "[name]"'s valsartan. I am also having him maintain his cycloSPORINE, aspirin EC, glucose blood, Lancets, Magnesium, PRESCRIPTION MEDICATION, vitamin B-12, loratadine, antiseptic oral rinse, polyvinyl alcohol, Iron (Ferrous Sulfate), bacitracin, PREBIOTIC PRODUCT PO, acetaminophen, fluticasone, (Menthol, Topical Analgesic, (ICY HOT EX)), metroNIDAZOLE, OVER THE COUNTER MEDICATION, cholestyramine, OVER THE COUNTER MEDICATION, multivitamin-lutein, polyethylene glycol powder, gabapentin, allopurinol, rosuvastatin, senna-docusate, albuterol, nitroGLYCERIN, hydrALAZINE, amLODipine, and levothyroxine.  Meds ordered this encounter  Medications   valsartan (DIOVAN) 320 MG tablet    Sig: Take 1 tablet (320 mg total) by mouth daily.    Dispense:  90 tablet    Refill:  1    Supervising Provider:   Danise Edge A [[id number]]

## 2023-10-24 NOTE — Assessment & Plan Note (Signed)
Lab Results  Component Value Date   TSH 7.740 (H) 09/15/2023   Recently adjusted dose.  Continue synthroid, update TSH.

## 2023-10-24 NOTE — Assessment & Plan Note (Signed)
Reports good compliance with cpap.

## 2023-10-24 NOTE — Assessment & Plan Note (Signed)
No issues recently, just gets cramping.

## 2023-10-24 NOTE — Patient Instructions (Signed)
VISIT SUMMARY:  [name] Pratt, you had a follow-up appointment today to manage your medications and discuss your ongoing health concerns. We reviewed your conditions, including hypertension, hypothyroidism, sleep apnea, peripheral neuropathy, and recent chest pain. We also discussed your current medications and any adjustments needed.  YOUR PLAN:  -RESTLESS LEG SYNDROME: You are not currently experiencing symptoms of restless leg syndrome, but you did have leg cramps which may be related to your back issues. We will continue with the current management.  -PERIPHERAL NEUROPATHY: Peripheral neuropathy causes burning pain in your feet, which is being managed with Gabapentin at bedtime. Please continue taking Gabapentin as prescribed.  -SLEEP APNEA: Sleep apnea is a condition where your breathing stops and starts during sleep. You are using a CPAP machine regularly, but experiencing issues with the mask. Continue using the CPAP and consider adjusting or replacing the mask if the issues persist.  -HYPOTHYROIDISM: Hypothyroidism is when your thyroid gland doesn't produce enough hormones. You are taking Levothyroxine daily but experiencing throat discomfort. We will check your thyroid function tests today and consider adjusting your dose based on the results.  -HYPERTENSION: Hypertension is high blood pressure. You are taking Amlodipine, Hydralazine, and Valsartan. We will refill your Valsartan prescription and continue with the current management.  INSTRUCTIONS:  Please follow up in 4 months. We will also check your thyroid function tests today and adjust your medication if needed.

## 2023-10-24 NOTE — Assessment & Plan Note (Signed)
BP mildly elevated. However given his hx of syncope, and his age will aim to keep SBP <150.  No changes to bp meds at this time.

## 2023-10-24 NOTE — Assessment & Plan Note (Signed)
Lab Results  Component Value Date   CHOL 125 01/22/2023   HDL 44.70 01/22/2023   LDLCALC 55 01/22/2023   TRIG 125.0 01/22/2023   CHOLHDL 3 01/22/2023   Last lipids good.  Continue crestor 5mg .

## 2023-10-24 NOTE — Assessment & Plan Note (Signed)
Lab Results  Component Value Date   HGBA1C 5.6 01/22/2023

## 2023-10-25 ENCOUNTER — Telehealth: Payer: Self-pay | Admitting: Family

## 2023-10-25 LAB — TSH: TSH: 6.78 u[IU]/mL — ABNORMAL HIGH (ref 0.35–5.50)

## 2023-10-26 ENCOUNTER — Telehealth: Payer: Self-pay | Admitting: Family

## 2023-10-26 DIAGNOSIS — E039 Hypothyroidism, unspecified: Secondary | ICD-10-CM

## 2023-10-26 MED ORDER — LEVOTHYROXINE SODIUM 112 MCG PO TABS
112.0000 ug | ORAL_TABLET | Freq: Every day | ORAL | 0 refills | Status: DC
Start: 2023-10-26 — End: 2024-01-24

## 2023-10-26 MED ORDER — LEVOTHYROXINE SODIUM 50 MCG PO TABS: 50.0000 ug | ORAL_TABLET | Freq: Every day | ORAL | 0 refills | Status: DC

## 2023-10-26 NOTE — Telephone Encounter (Signed)
Please advise pt that his thyroid testing is improving but we still need to go a bit higher on his dose.  He is currently on 150 mcg. I would like him to stop the dose and instead begin tab + 50 mcg tab= 162 mcg once daily. Take in the AM on an empty stomach separated by 30 minutes from food/other meds. Repeat tsh in 6 weeks.

## 2023-10-26 NOTE — Telephone Encounter (Signed)
Opened in error

## 2023-10-26 NOTE — Telephone Encounter (Signed)
Patient notified of results, medication dose increase and scheduled to come back in 6 weeks for tsh. Lab order placed as future

## 2023-10-31 ENCOUNTER — Ambulatory Visit: Payer: Self-pay | Admitting: Family

## 2023-10-31 NOTE — Telephone Encounter (Signed)
  Chief Complaint: Medication question Disposition: [] ED /[] Urgent Care (no appt availability in office) / [] Appointment(In office/virtual)/ []  [facility]/ [] Home Care/ [] Refused Recommended Disposition /[] [place] [facility]/ [x]  Follow-up with PCP Additional Notes: patient called with questions about his blood pressure medication. Patient states he was told to hold BP medication if BP is below 110/50. Patient states he held BP medication yesterday because BP was 105/55. Patient states he didn't take BP medication this morning-BP 128/65. States "that's a good blood pressure for me. I don't want my blood pressure to go down." Patient is needing clarification on BP medication management. Please call patient    Copied from CRM [id number]. Topic: Clinical - Medication Question >> Oct 31, 2023 11:44 AM Alona Bene A wrote: Reason for CRM: Patient called in to speak with nurse regarding BP medication. Please contact patient to advise on medication. Patient can be reached at [phone]. Reason for Disposition  [1] Caller has NON-URGENT medicine question about med that PCP prescribed AND [2] triager unable to answer question  Answer Assessment - Initial Assessment Questions 1. NAME of MEDICINE: "What medicine(s) are you calling about?"     Blood pressure medication 2. QUESTION: "What is your question?" (e.g., double dose of medicine, side effect)     Patient is needing clarification about when to hold his BP medication and needing reassurance about taking BP medication. Patient states he was told to hold medication if BP goes below 110/50. Patient states BP today was 128/65 but he didn't take medication because "that's a good BP for me." 3. PRESCRIBER: "Who prescribed the medicine?" Reason: if prescribed by specialist, call should be referred to that group.     [name] NP 4. SYMPTOMS: "Do you have any symptoms?" If Yes, ask: "What symptoms are you having?"  "How bad are the symptoms  (e.g., mild, moderate, severe)     No symptoms  Protocols used: Medication Question Call-A-AH

## 2023-10-31 NOTE — Telephone Encounter (Addendum)
Patient has been taking the hydralazine twice a day.  Tuesday he took it in the am and it was 105/55.  So he did not take it that night and this morning it was 128/65.  He does not want bp to get too low.  Please advise

## 2023-11-02 ENCOUNTER — Telehealth: Payer: Self-pay

## 2023-11-02 ENCOUNTER — Ambulatory Visit: Payer: Self-pay | Admitting: Family

## 2023-11-02 NOTE — Telephone Encounter (Signed)
Patient notified of this specific instructions and he verbalized understanding.

## 2023-11-02 NOTE — Telephone Encounter (Signed)
error 

## 2023-11-02 NOTE — Telephone Encounter (Signed)
  Chief Complaint: Patient reports he is taking Amlodipine twice daily- this may be wrong according to medication list directions- patient started Amlodipine due to high BP- but he has continued it and is using twice daily- now he is going low. Patient needs better instruction  Symptoms: patient reports he is not having symptoms- but he is sleepy when trying to concentrate on paperwork   Disposition: [] ED /[] Urgent Care (no appt availability in office) / [] Appointment(In office/virtual)/ []  [facility]/ [] Home Care/ [] Refused Recommended Disposition /[] [place] [facility]/ [x]  Follow-up with PCP Additional Notes: Patient states his BP is back down- does he continue medication? Patient reports he BP is down- he has been taking Amlodipine twice daily- (this may be too much).Please let him know how to back off and stop Amlodipine.  Please call and clarify for him   Copied from CRM [id number]. Topic: Clinical - Red Word Triage >> Nov 02, 2023 12:41 PM Corin V wrote: [facility] that prompted transfer to Nurse Triage: Patient called and his blood pressure this morning was 142/53 and heart rate 69. It is now 107/51 and then 111/53. Reason for Disposition . Diastolic BP < 50 mm Hg  Answer Assessment - Initial Assessment Questions 1. BLOOD PRESSURE: "What is the blood pressure?" "Did you take at least two measurements 5 minutes apart?"     120/52, 12:30  recheck 116/59 2. ONSET: "When did you take your blood pressure?"     Today 12:30 3. HOW: "How did you obtain the blood pressure?" (e.g., visiting nurse, automatic home BP monitor)     Automatic cuff- arm 4. HISTORY: "Do you have a history of low blood pressure?" "What is your blood pressure normally?"     Yes- with medication- am/pm, Patient was advised to check BP and stop medication if too low 5. MEDICINES: "Are you taking any medications for blood pressure?" If Yes, ask: "Have they been changed recently?"     2 low BP- amlodipine may be  making his BP too low-started 3 days ago- patient may be taking too much- he states he is taking hydralazine 3 times /day and valsartan daily- the amlodipine -he is taking twice daily- he is now confused as to how often and when- Rx states daily for BP over 160 6. PULSE RATE: "Do you know what your pulse rate is?"      80  Protocols used: Blood Pressure - Low-A-AH

## 2023-11-02 NOTE — Telephone Encounter (Signed)
Call to office- Katie notified

## 2023-11-02 NOTE — Telephone Encounter (Signed)
Continue both hydralazine and amlodipine but only take amlodipine if morning blood pressure is >140 systolic. Let me know if sbp drops <100.

## 2023-11-06 ENCOUNTER — Ambulatory Visit: Payer: Self-pay | Admitting: Licensed Clinical Social Worker

## 2023-11-06 NOTE — Patient Outreach (Signed)
 Care Coordination   Follow Up Visit Note   11/06/2023 Name: [name] Pratt MRN: [id number] DOB: 06-Nov-1940  [name] Pratt is a 83 y.o. year old male who sees [name] Craze, NP for primary care. I spoke with  [name] Pratt by phone today.  What matters to the patients health and wellness today?  Patient uses a cane or a walker as needed to help him walk. He is at risk for falls. Has decreased energy occasionally    Goals Addressed             This Visit's Progress    Patient uses a cane or a walker as needed to help him walk. He is at risk for falls. Has decreased energy occasionally       Interventions  Spoke with [name] Pratt, client , via phone today about his needs and status Discussed program support with RN, LCSW, Pharmacist Discussed client ambulation . He uses a cane or a walker as needed to help him walk.  He said he sometimes feels off balance. He has to walk carefully.  LCSW and client spoke of client physical therapy treatments . Client said he has completed physical therapy treatments Used Active Listening techniques to allow client to share his feelings Discussed medication procurement of client. Client said he had his prescribed medications Discussed transport needs. Client is still driving at present Client has support of NP, [name] Pratt Client has support from his spouse, [name] Pratt Discussed pain issues of client. Encouraged client  or [name] Pratt to call LCSW as needed at [phone].for SW support for client Thanked client for phone call with LCSW today Client was appreciative of call from LCSW today          SDOH assessments and interventions completed:  Yes  SDOH Interventions Today    Flowsheet Row Most Recent Value  SDOH Interventions   Physical Activity Interventions Other (Comments)  [client has mobility issues. uses a walker to help him walk. He is at risk for falls.]  Stress Interventions Other  (Comment)  [client has stress in managing his medical needs. He has stress in helping to manage needs of his spouse]        Care Coordination Interventions:  Yes, provided   Interventions Today    Flowsheet Row Most Recent Value  Chronic Disease   Chronic disease during today's visit Other  [spoke with client about client needs]  General Interventions   General Interventions Discussed/Reviewed General Interventions Discussed, Community Resources  Education Interventions   Education Provided Provided Education  Provided Verbal Education On [employer]  Mental Health Interventions   Mental Health Discussed/Reviewed Coping Strategies  Nutrition Interventions   Nutrition Discussed/Reviewed Nutrition Discussed  Pharmacy Interventions   Pharmacy Dicussed/Reviewed Pharmacy Topics Discussed  Safety Interventions   Safety Discussed/Reviewed Fall Risk        Follow up plan: Follow up call scheduled for 01/14/24 at 1:00 PM     Encounter Outcome:  Patient Visit Completed    Lorna Few  MSW, LCSW /[facility] Licensed Clinical Social Worker Direct Dial:  [phone] Fax:  [phone] Website:  [email or web address]

## 2023-11-06 NOTE — Patient Instructions (Signed)
 Visit Information  Thank you for taking time to visit with me today. Please don't hesitate to contact me if I can be of assistance to you.   Following are the goals we discussed today:   Goals Addressed             This Visit's Progress    Patient uses a cane or a walker as needed to help him walk. He is at risk for falls. Has decreased energy occasionally       Interventions  Spoke with Shirl Harris, client , via phone today about his needs and status Discussed program support with RN, LCSW, Pharmacist Discussed client ambulation . He uses a cane or a walker as needed to help him walk.  He said he sometimes feels off balance. He has to walk carefully.  LCSW and client spoke of client physical therapy treatments . Client said he has completed physical therapy treatments Used Active Listening techniques to allow client to share his feelings Discussed medication procurement of client. Client said he had his prescribed medications Discussed transport needs. Client is still driving at present Client has support of NP, [name] Client has support from his spouse, Efton Thomley Discussed pain issues of client. Encouraged client  or Cowen Pesqueira to call LCSW as needed at [phone].for SW support for client Thanked client for phone call with LCSW today Client was appreciative of call from LCSW today          Our next appointment is by telephone on 01/14/24 at 1:00 PM   Please call the care guide team at [phone] if you need to cancel or reschedule your appointment.   If you are experiencing a Mental Health or Behavioral Health Crisis or need someone to talk to, please go to [facility] [address], [place] [phone])   The patient verbalized understanding of instructions, educational materials, and care plan provided today and DECLINED offer to receive copy of patient instructions, educational materials, and care  plan.   The patient has been provided with contact information for the care management team and has been advised to call with any health related questions or concerns.    Lorna Few  MSW, LCSW [place] Licensed Clinical Social Worker Direct Dial:  [phone] Fax:  [phone] Website:  [email or web address]

## 2023-11-13 ENCOUNTER — Ambulatory Visit (INDEPENDENT_AMBULATORY_CARE_PROVIDER_SITE_OTHER): Admitting: Family

## 2023-11-13 ENCOUNTER — Ambulatory Visit: Payer: Self-pay | Admitting: Family

## 2023-11-13 ENCOUNTER — Ambulatory Visit: Payer: Self-pay | Admitting: Licensed Clinical Social Worker

## 2023-11-13 ENCOUNTER — Other Ambulatory Visit (HOSPITAL_BASED_OUTPATIENT_CLINIC_OR_DEPARTMENT_OTHER): Payer: Self-pay

## 2023-11-13 VITALS — BP 160/62 | HR 64 | Temp 98.0°F | Resp 16 | Ht 66.0 in | Wt 159.0 lb

## 2023-11-13 DIAGNOSIS — I10 Essential (primary) hypertension: Secondary | ICD-10-CM | POA: Diagnosis not present

## 2023-11-13 DIAGNOSIS — I6523 Occlusion and stenosis of bilateral carotid arteries: Secondary | ICD-10-CM

## 2023-11-13 DIAGNOSIS — G4733 Obstructive sleep apnea (adult) (pediatric): Secondary | ICD-10-CM

## 2023-11-13 DIAGNOSIS — R739 Hyperglycemia, unspecified: Secondary | ICD-10-CM

## 2023-11-13 DIAGNOSIS — E039 Hypothyroidism, unspecified: Secondary | ICD-10-CM | POA: Diagnosis not present

## 2023-11-13 DIAGNOSIS — R200 Anesthesia of skin: Secondary | ICD-10-CM

## 2023-11-13 DIAGNOSIS — E538 Deficiency of other specified B group vitamins: Secondary | ICD-10-CM

## 2023-11-13 DIAGNOSIS — E785 Hyperlipidemia, unspecified: Secondary | ICD-10-CM | POA: Diagnosis not present

## 2023-11-13 DIAGNOSIS — R5383 Other fatigue: Secondary | ICD-10-CM

## 2023-11-13 MED ORDER — AMLODIPINE BESYLATE 2.5 MG PO TABS: 2.5000 mg | ORAL_TABLET | Freq: Two times a day (BID) | ORAL | Status: DC

## 2023-11-13 MED ORDER — HYDROCHLOROTHIAZIDE 25 MG PO TABS
25.0000 mg | ORAL_TABLET | Freq: Every day | ORAL | 3 refills | Status: DC
  Filled 2023-11-13: qty 30, 30d supply, fill #0
  Filled 2023-12-12: qty 30, 30d supply, fill #1
  Filled 2024-01-08: qty 30, 30d supply, fill #2
  Filled 2024-02-05: qty 30, 30d supply, fill #3

## 2023-11-13 NOTE — Progress Notes (Signed)
 Subjective:     Patient ID: [name] Pratt, male    DOB: 22-Aug-1941, 83 y.o.   MRN: [id number]  Chief Complaint  Patient presents with   Fatigue    Patient complains of daytime fatigue    HPI  Discussed the use of AI scribe software for clinical note transcription with the patient, who gave verbal consent to proceed.  History of Present Illness  [name] Pratt "[name] Pratt" is an 83 year old male with hypertension and sleep apnea who presents with fatigue and CPAP issues.  He experiences significant fatigue, particularly in the mornings, feeling weak and having difficulty getting out of bed. He needs to ensure his legs are stable to prevent falls and notes that his balance is sometimes off, though he can correct it. He takes his blood pressure in the morning before eating, observing fluctuations with higher readings in the morning and more stable readings in the afternoon.  He uses a CPAP machine for sleep apnea but finds it uncomfortable due to weight changes affecting the mask fit. The mask is too tight, causing discomfort across his face, and he experiences dryness in his mouth and throat from prolonged oxygen use. He has not seen a specialist recently for these CPAP issues.  He experiences daytime sleepiness, feeling the need to nap, especially when sitting at his desk or watching TV. He attributes some of this to neck and spine issues, which also cause numbness in his hands. He has used topical foam for pain relief in his arms and feet but discontinued it due to excessive numbness.  He is currently taking blood pressure medication, including amlodipine, which he takes in the morning and at night. His blood pressure readings can be as high as 160/62, but typically are around 150/60-70 in the afternoon.  He has a history of thyroid issues and is currently taking Synthroid, which was recently increased from 100 to 112 mcg. He also takes B12 supplements daily, along with vitamin D3,  and questions whether the CPAP strap affects his thyroid.  He takes gabapentin at bedtime for neuropathy, which helps him sleep, but daytime use causes excessive sleepiness.  He has urinary frequency and urgency, needing to urinate multiple times in the morning and after drinking water. He has seen a urologist who prescribed medication to manage these symptoms, but he remains concerned about bladder control.  He mentions a history of heart issues, including a murmur, and has seen a cardiologist in the past. He recalls having his arteries checked previously but is unsure of recent follow-ups.        Health Maintenance Due  Topic Date Due   FOOT EXAM  09/29/2021   Medicare Annual Wellness (AWV)  01/20/2023   COVID-19 Vaccine (6 - 2024-25 season) 09/11/2023    Past Medical History:  Diagnosis Date   Allergic rhinitis 02/02/2016   Anemia 12/17/2009   Formatting of this note might be different from the original. 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 Hypertension   Benign paroxysmal positional vertigo 09/14/2021   Bilateral sacroiliitis (HCC) 07/22/2021   Body mass index (BMI) 25.0-25.9, adult 11/02/2020   Cancer (HCC)    skin cancer   Cardiac murmur 07/25/2021  Carotid stenosis    a. Carotid U/S 5/13: LICA < 50%, RICA 50-69%;  b.  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   Cystoid macular edema of both eyes 07/28/2020   Degenerative disc disease, lumbar 06/30/2015   Deviated nasal septum 02/18/2015   Diverticulosis 03/03/2020   DJD (degenerative joint disease) of hip 12/24/2012   Dyspnea 06/12/2011   Emphysema, unspecified (HCC) 03/05/2020   Erectile dysfunction 06/20/2017   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   Gait disorder 03/02/2021   GERD (gastroesophageal reflux disease)    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   Qualifier: Diagnosis of  By: Kem Parkinson     Greater trochanteric pain syndrome 07/22/2021   Hand pain, left 01/16/2022   Hearing loss 02/02/2016   Heme positive stool 02/29/2020   HIATAL HERNIA 05/13/2009   Qualifier: Diagnosis of  By: Kem Parkinson     High risk medication use 01/18/2012   Hip pain 03/02/2021   History of chicken pox    History of COVID-19 12/05/2021   History of hemorrhoids    History of kidney stones    History of skin cancer 07/22/2021   skin cancer   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 Osteoarthritis Of The Hand   10/1 IMO update   Osteoarthrosis, unspecified whether generalized or localized, pelvic region and thigh 07/25/2010   Formatting of this note might be different from the original. Osteoarthritis Of The Hip   Other abnormal glucose 07/22/2021   Other allergic rhinitis 02/18/2015   Palpitations 07/25/2021   Peripheral neuropathy 06/19/2016   Preventative health care 11/24/2015   Right groin pain 11/29/2012   Right inguinal hernia 05/13/2009   Qualifier: Diagnosis of  By: Kem Parkinson     RLS (restless legs syndrome) 02/18/2015   Rosacea    Rotator cuff tear 07/27/2013   Situational depression 03/02/2021   Skin lesion 01/16/2022   Sleep apnea with use of continuous positive airway pressure (CPAP) 07/15/2013   CPAP set to  7 cm water,  Residual AHi was 7.8  With no leak.  User time 6 hours.  479 days , not used only 12 days - highly compliant 06-23-13  .    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 Fawn Kirk   Ulnar neuropathy 01/24/2022   Urinary retention 11/26/2020   Ventral hernia 07/08/2012   Vertigo 07/08/2021   Weight loss 03/03/2020    Past Surgical History:  Procedure Laterality Date   ABDOMINAL HERNIA REPAIR  07/15/12   Dr Ashley Jacobs   ANTERIOR CERVICAL DECOMP/DISCECTOMY FUSION N/A 07/09/2020   Procedure: ANTERIOR CERVICAL DECOMPRESSION AND FUSION CERVICAL THREE-FOUR.;  Surgeon: Donalee Citrin, MD;  Location: [facility] OR;  Service: Neurosurgery;  Laterality: N/A;  anterior   BROW LIFT  05/07/01   COLONOSCOPY WITH PROPOFOL N/A 04/24/2022   Procedure: COLONOSCOPY WITH PROPOFOL;  Surgeon: Tressia Danas, MD;  Location: [facility] ENDOSCOPY;  Service: Gastroenterology;  Laterality: N/A;   COLONOSCOPY WITH  PROPOFOL N/A 04/26/2022   Procedure: COLONOSCOPY WITH PROPOFOL;  Surgeon: Tressia Danas, MD;  Location: [facility] ENDOSCOPY;  Service: Gastroenterology;  Laterality: N/A;   CYSTOSCOPY WITH URETEROSCOPY AND STENT PLACEMENT Right 01/28/2014   Procedure: CYSTOSCOPY WITH URETEROSCOPY, BASKET RETRIVAL AND  STENT PLACEMENT;  Surgeon: Valetta Fuller, MD;  Location: [facility] ORS;  Service: Urology;  Laterality: Right;   epidural injections     multiple procedures   ESOPHAGEAL DILATION  1993 and 1994   multiple times   EYE SURGERY Bilateral 03/25/10, 2012   cataract removal   HEMORRHOID SURGERY  2014   HIATAL HERNIA REPAIR  12/21/93   HOLMIUM LASER APPLICATION Right 01/28/2014   Procedure: HOLMIUM LASER APPLICATION;  Surgeon: Valetta Fuller, MD;  Location: [facility] ORS;  Service: Urology;  Laterality: Right;   INGUINAL HERNIA REPAIR Right 07/15/12   Dr Ashley Jacobs, x2   JOINT REPLACEMENT  07/25/04   right knee   JOINT REPLACEMENT  07/13/10   left knee   KNEE SURGERY  1995   LAPAROSCOPIC CHOLECYSTECTOMY  09/20/06   with intraoperative cholangiogram and right inguinal herniorrhaphy with mesh    LIGAMENT REPAIR Left 07/2013   shoulder   LITHOTRIPSY     x2   LUMBAR LAMINECTOMY/DECOMPRESSION MICRODISCECTOMY Bilateral 12/27/2022   Procedure: Lumbar Two-Three, Lumbar Three-Four Sublaminar Decompression;  Surgeon: Donalee Citrin, MD;  Location[facility] OR;  Service: Neurosurgery;  Laterality: Bilateral;   NASAL SEPTUM SURGERY  1975   POLYPECTOMY  04/26/2022   Procedure: POLYPECTOMY;  Surgeon: Tressia Danas, MD;  Location: [facility] ENDOSCOPY;  Service: Gastroenterology;;   POSTERIOR CERVICAL FUSION/FORAMINOTOMY N/A 11/24/2020   Procedure: Posterior Cervical Laminectomy Cervical three-four, Cervical four-five with lateral mass fusion;  Surgeon: Donalee Citrin, MD;  Location: [facility] OR;  Service: Neurosurgery;  Laterality: N/A;   REFRACTIVE SURGERY Bilateral    Right total hip replacement  4/14   SKIN CANCER DESTRUCTION     nose, ear    TONSILLECTOMY  as child   TOTAL HIP ARTHROPLASTY Left    URETHRAL DILATION  1991   Dr. Annabell Howells    Family History  Problem Relation Age of Onset   Cancer Mother    Hypertension Other    Cancer Other    Osteoarthritis Other    Stomach cancer Neg Hx    Colon cancer Neg Hx    Esophageal cancer Neg Hx     Social History   Socioeconomic History   Marital status: Married    Spouse name: Britta Mccreedy   Number of children: 1   Years of education: Not on file   Highest  education level: Not on file  Occupational History   Occupation: firefighter    Comment: paints on the side   Occupation: retired  Tobacco Use   Smoking status: Former    Current packs/day: 0.00    Types: Cigarettes    Start date: 09/12/1967    Quit date: 09/11/1978    Years since quitting: 45.2   Smokeless tobacco: Never  Vaping Use   Vaping status: Never Used  Substance and Sexual Activity   Alcohol use: Not Currently    Alcohol/week: 0.0 standard drinks of alcohol    Comment: rare   Drug use: No   Sexual activity: Not Currently  Other Topics Concern   Not on file  Social History Narrative   Lives with his wife   He has one son- lives locally.   2 grandchildren   Has worked for Warden/ranger   Enjoys wood working   Completed HS.  [employer]   Social Drivers of Corporate investment banker Strain: Low Risk  (10/28/2021)   Overall Financial Resource Strain (CARDIA)    Difficulty of Paying Living Expenses: Not very hard  Food Insecurity: No Food Insecurity (09/18/2023)   Hunger Vital Sign    Worried About Running Out of Food in the Last Year: Never true    Ran Out of Food in the Last Year: Never true  Transportation Needs: No Transportation Needs (09/18/2023)   PRAPARE - Administrator, Civil Service (Medical): No    Lack of Transportation (Non-Medical): No  Physical Activity: Inactive (11/13/2023)   Exercise Vital Sign    Days of Exercise per Week: 0 days    Minutes of Exercise per Session: 0  min  Stress: Stress Concern Present (11/13/2023)   [employer] of Occupational Health - Occupational Stress Questionnaire    Feeling of Stress : To some extent  Social Connections: Moderately Isolated (09/14/2023)   Social Connection and Isolation Panel [NHANES]    Frequency of Communication with Friends and Family: Three times a week    Frequency of Social Gatherings with Friends and Family: Three times a week    Attends Religious Services: Never    Active Member of Clubs or Organizations: No    Attends Banker Meetings: Never    Marital Status: Married  Catering manager Violence: Not At Risk (09/18/2023)   Humiliation, Afraid, Rape, and Kick questionnaire    Fear of Current or Ex-Partner: No    Emotionally Abused: No    Physically Abused: No    Sexually Abused: No    Outpatient Medications Prior to Visit  Medication Sig Dispense Refill   acetaminophen (TYLENOL) 500 MG tablet Take 500 mg by mouth at bedtime as needed for moderate pain (pain score 4-6).     albuterol (VENTOLIN HFA) 108 (90 Base) MCG/ACT inhaler Inhale 2 puffs into the lungs every 6 (six) hours as needed for wheezing or shortness of breath. INHALE 2 PUFFS INTO THE LUNGS EVERY 6 HOURS AS NEEDED FOR SHORTNESS OF BREATH. 1 each 3   allopurinol (ZYLOPRIM) 100 MG tablet Take 1 tablet (100 mg total) by mouth 2 (two) times daily. 180 tablet 1   antiseptic oral rinse (BIOTENE) LIQD 15 mLs by Mouth Rinse route 2 (two) times daily.     aspirin EC 81 MG tablet Take 81 mg by mouth at bedtime.     bacitracin ointment Apply 1 Application topically 2 (two) times daily. (Patient taking differently: Apply 1 Application topically daily as  needed for wound care.) 120 g 0   cholestyramine (QUESTRAN) 4 GM/DOSE powder Take 4 g by mouth as needed (digestion). 30 ml     cycloSPORINE (RESTASIS) 0.05 % ophthalmic emulsion Place 1 drop into both eyes 2 (two) times daily.     fluticasone (FLONASE) 50 MCG/ACT nasal spray Place 1 spray  into both nostrils at bedtime.     gabapentin (NEURONTIN) 300 MG capsule Take 1 capsule (300 mg total) by mouth at bedtime. 90 capsule 1   glucose blood test strip TRUE Metrix Blood Glucose Test Strip, use as instructed 100 each 12   hydrALAZINE (APRESOLINE) 50 MG tablet Take 1 tablet (50 mg total) by mouth 3 (three) times daily. 270 tablet 0   Iron, Ferrous Sulfate, 325 (65 Fe) MG TABS Take 325 mg by mouth every other day. (Patient taking differently: Take 325 mg by mouth daily.) 30 tablet 0   Lancets MISC 1 each by Does not apply route 2 (two) times daily. TRUE matrix lancets 100 each 3   levothyroxine (SYNTHROID) 112 MCG tablet Take 1 tablet (112 mcg total) by mouth daily before breakfast. Take in addition to 50 mcg tablet 90 tablet 0   levothyroxine (SYNTHROID) 50 MCG tablet Take 1 tablet (50 mcg total) by mouth daily before breakfast. (Take in addition to 112 mcg tablet) 90 tablet 0   loratadine (CLARITIN) 10 MG tablet Take 10 mg by mouth daily as needed for allergies or rhinitis.     Magnesium 500 MG TABS Take 500 mg by mouth daily.     Menthol, Topical Analgesic, (ICY HOT EX) Apply 1 Application topically at bedtime. Lidocaine / on back     metroNIDAZOLE (METROCREAM) 0.75 % cream Apply 1 Application topically daily as needed (head).     multivitamin-lutein (OCUVITE-LUTEIN) CAPS capsule Take 1 capsule by mouth daily.     nitroGLYCERIN (NITROSTAT) 0.4 MG SL tablet Place 1 tablet (0.4 mg total) under the tongue every 5 (five) minutes as needed for chest pain. 25 tablet 6   OVER THE COUNTER MEDICATION Place 1 spray into both nostrils 2 (two) times daily. Sinus saline     OVER THE COUNTER MEDICATION Take 15 mLs by mouth at bedtime. Yellow Mustard     polyethylene glycol powder (GLYCOLAX/MIRALAX) 17 GM/SCOOP powder Take 17 g by mouth 2 (two) times daily as needed for mild constipation, moderate constipation or severe constipation. 255 g 0   polyvinyl alcohol (ARTIFICIAL TEARS) 1.4 % ophthalmic  solution Place 1 drop into both eyes daily as needed for dry eyes.     PREBIOTIC PRODUCT PO Take 1 tablet by mouth daily.     PRESCRIPTION MEDICATION CPAP- At bedtime and during any time of rest     rosuvastatin (CRESTOR) 5 MG tablet Take 1 tablet (5 mg total) by mouth daily. 90 tablet 1   senna-docusate (SENOKOT-S) 8.6-50 MG tablet Take 2 tablets by mouth 2 (two) times daily. 120 tablet 0   valsartan (DIOVAN) 320 MG tablet Take 1 tablet (320 mg total) by mouth daily. 90 tablet 1   vitamin B-12 (CYANOCOBALAMIN) 500 MCG tablet Take 500 mcg by mouth in the morning.     amLODipine (NORVASC) 2.5 MG tablet Take 1 tablet (2.5 mg total) by mouth daily as needed (for blood pressure >160). 90 tablet 0   No facility-administered medications prior to visit.    Allergies  Allergen Reactions   Pravastatin Other (See Comments)    Muscle cramps   Sildenafil Other (See Comments)  Tachycardia     Oxycodone Other (See Comments)    Hallucinations    Atenolol Other (See Comments)    Bradycardia    Metoclopramide Hcl Anxiety    ROS    See HPI Objective:    Physical Exam Constitutional:      General: He is not in acute distress.    Appearance: He is well-developed.  HENT:     Head: Normocephalic and atraumatic.  Cardiovascular:     Rate and Rhythm: Normal rate and regular rhythm.     Heart sounds: Murmur heard.     Systolic murmur is present with a grade of 2/6.  Pulmonary:     Effort: Pulmonary effort is normal. No respiratory distress.     Breath sounds: Normal breath sounds. No wheezing or rales.  Skin:    General: Skin is warm and dry.  Neurological:     Mental Status: He is alert and oriented to person, place, and time.  Psychiatric:        Behavior: Behavior normal.        Thought Content: Thought content normal.      BP (!) 160/62   Pulse 64   Temp 98 F (36.7 C) (Oral)   Resp 16   Ht 5\' 6"  (1.676 m)   Wt 159 lb (72.1 kg)   SpO2 96%   BMI 25.66 kg/m  Wt Readings  from Last 3 Encounters:  11/13/23 159 lb (72.1 kg)  10/24/23 160 lb (72.6 kg)  09/26/23 163 lb (73.9 kg)       Assessment & Plan:   Problem List Items Addressed This Visit       Unprioritized   OSA (obstructive sleep apnea) - Primary   It sounds like his CPAP mask is not fitting properly.  Could be a contributor to his fatigue.       Relevant Orders   Ambulatory referral to Pulmonology   Numbness of hand   Will check b12- but most likely due to his hx of cervical myelopathy.  Offered daytime gabapentin, but it makes him too drowsy.        Hypothyroidism   Lab Results  Component Value Date   TSH 6.78 (H) 10/24/2023   Continue recently increased dose of synthroid he is on 112 mcg + 50 mcg, increased from 150 mg.       Hyperlipidemia   Lab Results  Component Value Date   CHOL 125 01/22/2023   HDL 44.70 01/22/2023   LDLCALC 55 01/22/2023   TRIG 125.0 01/22/2023   CHOLHDL 3 01/22/2023   Plan repeat lipids next visit. Stable last check, continue crestor.       Relevant Medications   hydrochlorothiazide (HYDRODIURIL) 25 MG tablet   amLODipine (NORVASC) 2.5 MG tablet   Other Relevant Orders   Ambulatory referral to Cardiology   HTN (hypertension)    Variable blood pressure readings at home, with some elevated readings in the morning and lower readings in the afternoon (150's typically in the PM). Patient is adherent to current antihypertensive medications (amlodipine 2.5mg  twice daily, valsartan daily, and hydralazine three times daily). -HR will not allow for addition of beta blocker. -Add Hydrochlorothiazide (HCTZ) in the morning for blood pressure control. -Recheck blood pressure and potassium in one week. -Advise patient to bring blood pressure log to next visit.      Relevant Medications   hydrochlorothiazide (HYDRODIURIL) 25 MG tablet   amLODipine (NORVASC) 2.5 MG tablet   Other Relevant Orders  Comp Met (CMET)   Ambulatory referral to Cardiology    Carotid stenosis   Duplex 2023 noted:   minimal heterogeneous and calcified plaque, with no hemodynamically significant stenosis by duplex criteria in the extracranial cerebrovascular circulation on left.   No significant calcifications of the right common carotid artery. Intermediate waveform maintained. Heterogeneous and partially calcified plaque at the right carotid bifurcation.       Relevant Medications   hydrochlorothiazide (HYDRODIURIL) 25 MG tablet   amLODipine (NORVASC) 2.5 MG tablet   Other Relevant Orders   Ambulatory referral to Cardiology   Other Visit Diagnoses       B12 deficiency       Relevant Orders   B12     Fatigue, unspecified type       Relevant Orders   CBC w/Diff       I have changed Juanpablo B. Pree "[name]"'s amLODipine. I am also having him start on hydrochlorothiazide. Additionally, I am having him maintain his cycloSPORINE, aspirin EC, glucose blood, Lancets, Magnesium, PRESCRIPTION MEDICATION, vitamin B-12, loratadine, antiseptic oral rinse, polyvinyl alcohol, Iron (Ferrous Sulfate), bacitracin, PREBIOTIC PRODUCT PO, acetaminophen, fluticasone, (Menthol, Topical Analgesic, (ICY HOT EX)), metroNIDAZOLE, OVER THE COUNTER MEDICATION, cholestyramine, OVER THE COUNTER MEDICATION, multivitamin-lutein, polyethylene glycol powder, gabapentin, allopurinol, rosuvastatin, senna-docusate, albuterol, nitroGLYCERIN, hydrALAZINE, valsartan, levothyroxine, and levothyroxine.  Meds ordered this encounter  Medications   hydrochlorothiazide (HYDRODIURIL) 25 MG tablet    Sig: Take 1 tablet (25 mg total) by mouth daily.    Dispense:  30 tablet    Refill:  3    Supervising Provider:   Danise Edge A [[id number]]   amLODipine (NORVASC) 2.5 MG tablet    Sig: Take 1 tablet (2.5 mg total) by mouth 2 (two) times daily.    Supervising Provider:   Danise Edge A [phone]

## 2023-11-13 NOTE — Patient Outreach (Signed)
 Care Coordination   Follow Up Visit Note   11/13/2023 Name: [name] Pratt MRN: [id number] DOB: [birth year]  [name] is a 83 y.o. year old male who sees [name], NP for primary care. I spoke with  [name] Pratt by phone today.  What matters to the patients health and wellness today?  Patient uses a cane or a walker as needed to help him walk. He is at risk for falls. Has decreased energy occasionally     Goals Addressed             This Visit's Progress    Patient uses a cane or a walker as needed to help him walk. He is at risk for falls. Has decreased energy occasionally       Interventions  Spoke with Shirl Harris, client , via phone today about his needs and status Discussed program support with RN, LCSW, Pharmacist Discussed client ambulation . He uses a cane or a walker as needed to help him walk.  He said he sometimes feels off balance. He has to walk carefully.  Used Active Listening techniques to allow client to share his feelings Discussed medication procurement of client. Client said he had his prescribed medications. Discussed sleeping challenges of client. Client uses C-PAP to help him sleep. Client gets supplies from [employer] Client said he is having some difficulty sleeping LCSW encouraged client to talk with NP [name] regarding his use of C-Pap and questions he has about oxygen needs of client Discussed transport needs. Client is still driving at present Client spoke of dry eyes. Client spoke of dryness in his mouth when he wakes up Client spoke of monitoring his blood pressure readings Client has support from his spouse, [name] Pratt Encouraged client to call LCSW as needed at [phone].for SW support for client Thanked client for phone call with LCSW today Client was appreciative of call from LCSW today          SDOH assessments and interventions completed:  Yes  SDOH Interventions Today     Flowsheet Row Most Recent Value  SDOH Interventions   Physical Activity Interventions Other (Comments)  [client has some mobility challenges. He uses a cane or a walker as needed to help him walk]  Stress Interventions Provide Counseling  [client has stress in managing his medical needs]        Care Coordination Interventions:  Yes, provided   Interventions Today    Flowsheet Row Most Recent Value  Chronic Disease   Chronic disease during today's visit Other  [spoke with client about client needs]  General Interventions   General Interventions Discussed/Reviewed General Interventions Discussed, Community Resources  Education Interventions   Education Provided Provided Education  Provided Verbal Education On [employer]  Mental Health Interventions   Mental Health Discussed/Reviewed Coping Strategies  Nutrition Interventions   Nutrition Discussed/Reviewed Nutrition Discussed  Pharmacy Interventions   Pharmacy Dicussed/Reviewed Pharmacy Topics Discussed  Safety Interventions   Safety Discussed/Reviewed Fall Risk        Follow up plan: LCSW has provided client with LCSW name and phone number.  LCSW has encouraged client to call LCSW as needed for SW support for client at [phone]   Encounter Outcome:  Patient Visit Completed    Lorna Few  MSW, LCSW [place] Licensed Clinical Social Worker Direct Dial:  [phone] Fax:  [phone] Website:  [email or web address]

## 2023-11-13 NOTE — Assessment & Plan Note (Signed)
>>  ASSESSMENT AND PLAN FOR OSA (OBSTRUCTIVE SLEEP APNEA) WRITTEN ON 11/13/2023  5:15 PM BY O'SULLIVAN, Sharline Lehane, NP  It sounds like his CPAP mask is not fitting properly.  Could be a contributor to his fatigue.

## 2023-11-13 NOTE — Assessment & Plan Note (Signed)
 It sounds like his CPAP mask is not fitting properly.  Could be a contributor to his fatigue.

## 2023-11-13 NOTE — Assessment & Plan Note (Signed)
 Lab Results  Component Value Date   CHOL 125 01/22/2023   HDL 44.70 01/22/2023   LDLCALC 55 01/22/2023   TRIG 125.0 01/22/2023   CHOLHDL 3 01/22/2023   Plan repeat lipids next visit. Stable last check, continue crestor.

## 2023-11-13 NOTE — Patient Instructions (Signed)
 Visit Information  Thank you for taking time to visit with me today. Please don't hesitate to contact me if I can be of assistance to you.   Following are the goals we discussed today:   Goals Addressed             This Visit's Progress    Patient uses a cane or a walker as needed to help him walk. He is at risk for falls. Has decreased energy occasionally       Interventions  Spoke with Shirl Harris, client , via phone today about his needs and status Discussed program support with RN, LCSW, Pharmacist Discussed client ambulation . He uses a cane or a walker as needed to help him walk.  He said he sometimes feels off balance. He has to walk carefully.  Used Active Listening techniques to allow client to share his feelings Discussed medication procurement of client. Client said he had his prescribed medications. Discussed sleeping challenges of client. Client uses C-PAP to help him sleep. Client gets supplies from [employer] Client said he is having some difficulty sleeping LCSW encouraged client to talk with NP [name] regarding his use of C-Pap and questions he has about oxygen needs of client Discussed transport needs. Client is still driving at present Client spoke of dry eyes. Client spoke of dryness in his mouth when he wakes up Client spoke of monitoring his blood pressure readings Client has support from his spouse, Naomi Fitton Encouraged client to call LCSW as needed at [phone].for SW support for client Thanked client for phone call with LCSW today Client was appreciative of call from LCSW today         LCSW has provided client with LCSW name and phone number.  LCSW has encouraged client to call LCSW as needed for SW support for client at [phone]  Please call the care guide team at [phone] if you need to cancel or reschedule your appointment.   If you are experiencing a Mental Health or Behavioral Health Crisis or need someone to  talk to, please go to [facility] [address] [place] [phone])   The patient verbalized understanding of instructions, educational materials, and care plan provided today and DECLINED offer to receive copy of patient instructions, educational materials, and care plan.   The patient has been provided with contact information for the care management team and has been advised to call with any health related questions or concerns.    Lorna Few  MSW, LCSW [place] Licensed Clinical Social Worker Direct Dial:  [phone] Fax:  [phone] Website:  [email or web address]

## 2023-11-13 NOTE — Assessment & Plan Note (Addendum)
  Variable blood pressure readings at home, with some elevated readings in the morning and lower readings in the afternoon (150's typically in the PM). Patient is adherent to current antihypertensive medications (amlodipine 2.5mg  twice daily, valsartan daily, and hydralazine three times daily). -HR will not allow for addition of beta blocker. -Add Hydrochlorothiazide (HCTZ) in the morning for blood pressure control. -Recheck blood pressure and potassium in one week. -Advise patient to bring blood pressure log to next visit.

## 2023-11-13 NOTE — Assessment & Plan Note (Signed)
 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  Offered daytime gabapentin, but it makes him too drowsy.

## 2023-11-13 NOTE — Assessment & Plan Note (Signed)
 Duplex 2023 noted:   minimal heterogeneous and calcified plaque, with no hemodynamically significant stenosis by duplex criteria in the extracranial cerebrovascular circulation on left.   No significant calcifications of the right common carotid artery. Intermediate waveform maintained. Heterogeneous and partially calcified plaque at the right carotid bifurcation.

## 2023-11-13 NOTE — Telephone Encounter (Signed)
 Copied from CRM [id number]. Topic: Clinical - Red Word Triage >> Nov 13, 2023  1:05 PM [name] Pratt wrote: [facility] that prompted transfer to Nurse Triage: Patient is having trouble with his CPAP machine and something may be going on wih his oxygen.  Symptoms-mouth dry, shortness of breath (alarm goes off), weak when standing up.   Chief Complaint: Fatigue  Symptoms: Fatigue upon waking, shortness of breath with exertion  Frequency: Frequent ongoing problem  Disposition: [] ED /[] Urgent Care (no appt availability in office) / [x] Appointment(In office/virtual)/ []  [place] [facility]/ [] Home Care/ [] Refused Recommended Disposition /[] [place] [facility]/ []  Follow-up with PCP Additional Notes: Patient reports that he has been experiencing fatigue upon first waking that improves after getting up and taking deep breaths. He states he was in the hospital 2 months ago and was on oxygen throughout his stay, and is concerned that he may need oxygen at home. He states he has some shortness of breath with exertion. Appointment made for the patient today to discuss concerns.    Reason for Disposition  [1] Fatigue (i.e., tires easily, decreased energy) AND [2] persists > 1 week  Answer Assessment - Initial Assessment Questions 1. DESCRIPTION: "Describe how you are feeling."     Feels weak upon first waking but then resolves  2. SEVERITY: "How bad is it?"  "Can you stand and walk?"   - MILD (0-3): Feels weak or tired, but does not interfere with work, school or normal activities.   - MODERATE (4-7): Able to stand and walk; weakness interferes with work, school, or normal activities.   - SEVERE (8-10): Unable to stand or walk; unable to do usual activities.     Mild  3. ONSET: "When did these symptoms begin?" (e.g., hours, days, weeks, months)     Ongoing problem  4. CAUSE: "What do you think is causing the weakness or fatigue?" (e.g., not drinking enough fluids, medical problem, trouble sleeping)      Unsure if it is due to oxygen level  5. NEW MEDICINES:  "Have you started on any new medicines recently?" (e.g., opioid pain medicines, benzodiazepines, muscle relaxants, antidepressants, antihistamines, neuroleptics, beta blockers)     No 6. OTHER SYMPTOMS: "Do you have any other symptoms?" (e.g., chest pain, fever, cough, SOB, vomiting, diarrhea, bleeding, other areas of pain)     Some shortness of breath with exertion  Protocols used: Weakness (Generalized) and Fatigue-A-AH

## 2023-11-13 NOTE — Assessment & Plan Note (Addendum)
 Lab Results  Component Value Date   TSH 6.78 (H) 10/24/2023   Continue recently increased dose of synthroid he is on 112 mcg + 50 mcg, increased from 150 mg.

## 2023-11-14 LAB — COMPREHENSIVE METABOLIC PANEL
ALT: 14 U/L (ref 0–53)
AST: 22 U/L (ref 0–37)
Albumin: 4.6 g/dL (ref 3.5–5.2)
Alkaline Phosphatase: 80 U/L (ref 39–117)
BUN: 32 mg/dL — ABNORMAL HIGH (ref 6–23)
CO2: 28 meq/L (ref 19–32)
Calcium: 10.3 mg/dL (ref 8.4–10.5)
Chloride: 102 meq/L (ref 96–112)
Creatinine, Ser: 1.35 mg/dL (ref 0.40–1.50)
GFR: 48.47 mL/min — ABNORMAL LOW (ref >60.00)
Glucose, Bld: 101 mg/dL — ABNORMAL HIGH (ref 70–99)
Potassium: 4.5 meq/L (ref 3.5–5.1)
Sodium: 141 meq/L (ref 135–145)
Total Bilirubin: 0.7 mg/dL (ref 0.2–1.2)
Total Protein: 8.1 g/dL (ref 6.0–8.3)

## 2023-11-14 LAB — CBC WITH DIFFERENTIAL/PLATELET
Basophils Absolute: 0.1 10*3/uL (ref 0.0–0.1)
Basophils Relative: 0.8 % (ref 0.0–3.0)
Eosinophils Absolute: 0.2 10*3/uL (ref 0.0–0.7)
Eosinophils Relative: 2.1 % (ref 0.0–5.0)
HCT: 39.9 % (ref 39.0–52.0)
Hemoglobin: 13.1 g/dL (ref 13.0–17.0)
Lymphocytes Relative: 22.6 % (ref 12.0–46.0)
Lymphs Abs: 2 10*3/uL (ref 0.7–4.0)
MCHC: 32.8 g/dL (ref 30.0–36.0)
MCV: 99.5 fl (ref 78.0–100.0)
Monocytes Absolute: 0.8 10*3/uL (ref 0.1–1.0)
Monocytes Relative: 8.7 % (ref 3.0–12.0)
Neutro Abs: 5.7 10*3/uL (ref 1.4–7.7)
Neutrophils Relative %: 65.8 % (ref 43.0–77.0)
Platelets: 406 10*3/uL — ABNORMAL HIGH (ref 150.0–400.0)
RBC: 4.01 Mil/uL — ABNORMAL LOW (ref 4.22–5.81)
RDW: 14.6 % (ref 11.5–15.5)
WBC: 8.6 10*3/uL (ref 4.0–10.5)

## 2023-11-14 LAB — VITAMIN B12: Vitamin B-12: >1537 pg/mL — ABNORMAL HIGH (ref 211–911)

## 2023-11-15 ENCOUNTER — Other Ambulatory Visit: Payer: Self-pay | Admitting: Family

## 2023-11-15 ENCOUNTER — Encounter: Payer: Self-pay | Admitting: Family

## 2023-11-15 MED ORDER — ALLOPURINOL 100 MG PO TABS: 100.0000 mg | ORAL_TABLET | Freq: Two times a day (BID) | ORAL | 1 refills | Status: DC

## 2023-11-15 NOTE — Telephone Encounter (Signed)
 Copied from CRM [id number]. Topic: Clinical - Medication Refill >> Nov 15, 2023  1:38 PM [place] J wrote: Most Recent Primary Care Visit:  Provider: O'SULLIVAN, MELISSA  Department: [facility]  Visit Type: OFFICE VISIT  Date: 10/24/2023  Medication: allopurinol (ZYLOPRIM) 100 MG tablet   Has the patient contacted their pharmacy? Yes (Agent: If no, request that the patient contact the pharmacy for the refill. If patient does not wish to contact the pharmacy document the reason why and proceed with request.) (Agent: If yes, when and what did the pharmacy advise?)  Is this the correct pharmacy for this prescription? Yes If no, delete pharmacy and type the correct one.  This is the patient's preferred pharmacy:  [id number] - [place], [place] - [address] [place] [address] Phone: [phone] Fax: [phone]  [facility] [place], [place] - [address] [place] [address] Phone: [phone] Fax: [phone]  [facility] [place] [facility] [address] [place] [address] Phone: [phone] Fax: [phone]   Has the prescription been filled recently? No  Is the patient out of the medication? No  Has the patient been seen for an appointment in the last year OR does the patient have an upcoming appointment? Yes  Can we respond through MyChart? Yes  Agent: Please be advised that Rx refills may take up to 3 business days. We ask that you follow-up with your pharmacy.

## 2023-11-18 ENCOUNTER — Emergency Department (HOSPITAL_BASED_OUTPATIENT_CLINIC_OR_DEPARTMENT_OTHER)
Admission: EM | Admit: 2023-11-18 | Discharge: 2023-11-18 | Disposition: A | Discharge status: Home / Self Care | Attending: Emergency Medicine | Admitting: Emergency Medicine

## 2023-11-18 ENCOUNTER — Emergency Department (HOSPITAL_BASED_OUTPATIENT_CLINIC_OR_DEPARTMENT_OTHER)

## 2023-11-18 ENCOUNTER — Other Ambulatory Visit: Payer: Self-pay

## 2023-11-18 ENCOUNTER — Encounter (HOSPITAL_BASED_OUTPATIENT_CLINIC_OR_DEPARTMENT_OTHER): Payer: Self-pay | Admitting: Urology

## 2023-11-18 DIAGNOSIS — W1839XA Other fall on same level, initial encounter: Secondary | ICD-10-CM | POA: Insufficient documentation

## 2023-11-18 DIAGNOSIS — M25561 Pain in right knee: Secondary | ICD-10-CM | POA: Diagnosis not present

## 2023-11-18 DIAGNOSIS — S8991XA Unspecified injury of right lower leg, initial encounter: Secondary | ICD-10-CM | POA: Insufficient documentation

## 2023-11-18 DIAGNOSIS — Z96651 Presence of right artificial knee joint: Secondary | ICD-10-CM | POA: Insufficient documentation

## 2023-11-18 DIAGNOSIS — Z7982 Long term (current) use of aspirin: Secondary | ICD-10-CM | POA: Insufficient documentation

## 2023-11-18 DIAGNOSIS — Z471 Aftercare following joint replacement surgery: Secondary | ICD-10-CM | POA: Diagnosis not present

## 2023-11-18 NOTE — Discharge Instructions (Signed)
 You have been seen and discharged from the emergency department.  Your knee x-ray showed no fracture or problem with the hardware.  You may use the Ace wrap for support.  Take over-the-counter medications for pain control.  Rest and ice/elevate the leg when you can.  Follow-up with your primary provider for further evaluation and further care. Take home medications as prescribed. If you have any worsening symptoms or further concerns for your health please return to an emergency department for further evaluation.

## 2023-11-18 NOTE — ED Provider Notes (Signed)
 [facility] [place] Provider Note   CSN: [id number] Arrival date & time: 11/18/23  1735     History  Chief Complaint  Patient presents with   Knee Injury    [name] Pratt is a 83 y.o. male.  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  2 days ago when the patient was pushing a shopping cart it turned quickly to the right and he hit the right knee on the bracket sustaining a superficial abrasion.  Since then he has been using his walker for support.  The abrasion is healing but he comes in today with some worsening swelling of the knee.  He has otherwise been weightbearing.  No numbness or tingling to the leg.  He is not on anticoagulation.  Home Medications Prior to Admission medications   Medication Sig Start Date End Date Taking? Authorizing Provider  acetaminophen (TYLENOL) 500 MG tablet Take 500 mg by mouth at bedtime as needed for moderate pain (pain score 4-6).    [provider]  albuterol (VENTOLIN HFA) 108 (90 Base) MCG/ACT inhaler Inhale 2 puffs into the lungs every 6 (six) hours as needed for wheezing or shortness of breath. INHALE 2 PUFFS INTO THE LUNGS EVERY 6 HOURS AS NEEDED FOR SHORTNESS OF BREATH. 09/26/23   [name], NP  allopurinol (ZYLOPRIM) 100 MG tablet Take 1 tablet (100 mg total) by mouth 2 (two) times daily. 11/15/23   [name], NP  amLODipine (NORVASC) 2.5 MG tablet Take 1 tablet (2.5 mg total) by mouth 2 (two) times daily. 11/13/23   [name], NP  antiseptic oral rinse (BIOTENE) LIQD 15 mLs by Mouth Rinse route 2 (two) times daily.    [provider]  aspirin EC 81 MG tablet Take 81 mg by mouth at bedtime.    [provider]  bacitracin ointment Apply 1 Application topically 2 (two) times daily. Patient taking differently: Apply 1 Application topically daily as needed for wound care. 09/15/22    [name], [name], DO  cholestyramine Lanetta Inch) 4 GM/DOSE powder Take 4 g by mouth as needed (digestion). 30 ml    [provider]  cycloSPORINE (RESTASIS) 0.05 % ophthalmic emulsion Place 1 drop into both eyes 2 (two) times daily.    [provider]  fluticasone (FLONASE) 50 MCG/ACT nasal spray Place 1 spray into both nostrils at bedtime.    [provider]  gabapentin (NEURONTIN) 300 MG capsule Take 1 capsule (300 mg total) by mouth at bedtime. 07/17/23   [name], NP  glucose blood test strip TRUE Metrix Blood Glucose Test Strip, use as instructed 11/12/20   [name], NP  hydrALAZINE (APRESOLINE) 50 MG tablet Take 1 tablet (50 mg total) by mouth 3 (three) times daily. 10/10/23   [name], NP  hydrochlorothiazide (HYDRODIURIL) 25 MG tablet Take 1 tablet (25 mg total) by mouth daily. 11/13/23   [name], NP  Iron, Ferrous Sulfate, 325 (65 Fe) MG TABS Take 325 mg by mouth every other day. Patient taking differently: Take 325 mg by mouth daily. 08/10/22   [name], NP  Lancets MISC 1 each by Does not apply route 2 (two) times daily. TRUE matrix lancets 11/12/20   [name], NP  levothyroxine (SYNTHROID) 112 MCG tablet Take 1 tablet (112 mcg total) by mouth daily before breakfast. Take in addition to 50 mcg tablet 10/26/23   [name], NP  levothyroxine (SYNTHROID)  50 MCG tablet Take 1 tablet (50 mcg total) by mouth daily before breakfast. (Take in addition to 112 mcg tablet) 10/26/23   [name], NP  loratadine (CLARITIN) 10 MG tablet Take 10 mg by mouth daily as needed for allergies or rhinitis.    [provider]  Magnesium 500 MG TABS Take 500 mg by mouth daily.    [provider]  Menthol, Topical Analgesic, (ICY HOT EX) Apply 1 Application topically at bedtime. Lidocaine / on back    [provider]  metroNIDAZOLE (METROCREAM) 0.75 % cream Apply 1 Application topically  daily as needed (head).    [provider]  multivitamin-lutein (OCUVITE-LUTEIN) CAPS capsule Take 1 capsule by mouth daily.    [provider]  nitroGLYCERIN (NITROSTAT) 0.4 MG SL tablet Place 1 tablet (0.4 mg total) under the tongue every 5 (five) minutes as needed for chest pain. 09/26/23   [name], NP  OVER THE COUNTER MEDICATION Place 1 spray into both nostrils 2 (two) times daily. Sinus saline    [provider]  OVER THE COUNTER MEDICATION Take 15 mLs by mouth at bedtime. Yellow Mustard    [provider]  polyethylene glycol powder (GLYCOLAX/MIRALAX) 17 GM/SCOOP powder Take 17 g by mouth 2 (two) times daily as needed for mild constipation, moderate constipation or severe constipation. 12/30/22   Renne Crigler, PA-C  polyvinyl alcohol (ARTIFICIAL TEARS) 1.4 % ophthalmic solution Place 1 drop into both eyes daily as needed for dry eyes.    [provider]  PREBIOTIC PRODUCT PO Take 1 tablet by mouth daily.    [provider]  PRESCRIPTION MEDICATION CPAP- At bedtime and during any time of rest    [provider]  rosuvastatin (CRESTOR) 5 MG tablet Take 1 tablet (5 mg total) by mouth daily. 07/17/23   [name], NP  senna-docusate (SENOKOT-S) 8.6-50 MG tablet Take 2 tablets by mouth 2 (two) times daily. 09/17/23   Osvaldo Shipper, MD  valsartan (DIOVAN) 320 MG tablet Take 1 tablet (320 mg total) by mouth daily. 10/24/23   [name], NP  vitamin B-12 (CYANOCOBALAMIN) 500 MCG tablet Take 500 mcg by mouth in the morning.    [provider]      Allergies    Pravastatin, Sildenafil, Oxycodone, Atenolol, and Metoclopramide hcl    Review of Systems   Review of Systems  Constitutional:  Negative for fever.  Respiratory:  Negative for shortness of breath.   Cardiovascular:  Negative for chest pain.  Musculoskeletal:        + right knee pain  Skin:  Positive for wound.  Neurological:  Negative for  headaches.    Physical Exam Updated Vital Signs BP (!) 174/72 (BP Location: Left Arm)   Pulse 73   Temp 97.8 F (36.6 C)   Resp 18   Ht 5\' 6"  (1.676 m)   Wt 72 kg   SpO2 96%   BMI 25.62 kg/m  Physical Exam Vitals and nursing note reviewed.  Constitutional:      Appearance: Normal appearance.  HENT:     Head: Normocephalic.     Mouth/Throat:     Mouth: Mucous membranes are moist.  Cardiovascular:     Rate and Rhythm: Normal rate.  Pulmonary:     Effort: Pulmonary effort is normal. No respiratory distress.  Musculoskeletal:     Comments: Healing abrasion of the right knee overlying the kneecap, swelling noted inferiorly to the kneecap which is where the most tenderness  is.  Otherwise the knee Is nontender and the knee feels stable.  Leg is neurovascularly intact.  He has good knee extension.  Skin:    General: Skin is warm.  Neurological:     Mental Status: He is alert and oriented to person, place, and time. Mental status is at baseline.  Psychiatric:        Mood and Affect: Mood normal.     ED Results / Procedures / Treatments   Labs (all labs ordered are listed, but only abnormal results are displayed) Labs Reviewed - No data to display  EKG None  Radiology DG Knee Complete 4 Views Right Result Date: 11/18/2023 CLINICAL DATA:  Right knee pain after fall 2 days ago. EXAM: RIGHT KNEE - COMPLETE 4+ VIEW COMPARISON:  None Available. FINDINGS: Status post right total knee arthroplasty. No fracture or dislocation is noted. IMPRESSION: No acute abnormality seen. Electronically Signed   By: Lupita Raider M.D.   On: 11/18/2023 18:48    Procedures Procedures    Medications Ordered in ED Medications - No data to display  ED Course/ Medical Decision Making/ A&P                                 Medical Decision Making Amount and/or Complexity of Data Reviewed Radiology: ordered.   83 year old male presents emergency department with right knee injury.  Previous  right knee replacement.  Has been ambulatory and weightbearing.  X-ray shows no acute finding.  He has swelling just inferior to the knee Which appears to be the most tender place.  Will place an Ace wrap and refer outpatient, he may continue using his walker.  Patient is comfortable with this and requesting discharge.  Patient at this time appears safe and stable for discharge and close outpatient follow up. Discharge plan and strict return to ED precautions discussed, patient verbalizes understanding and agreement.        Final Clinical Impression(s) / ED Diagnoses Final diagnoses:  None    Rx / DC Orders ED Discharge Orders     None         Rozelle [place], DO 11/18/23 1922

## 2023-11-18 NOTE — ED Triage Notes (Signed)
 Pt ambulatory with walker to triage  Pt states fall 2 days ago, injury to right knee, h/o knee replacement  Denies any head injury or other injuries  Denies blood thinners  Healing abrasion noted to right knee

## 2023-11-19 ENCOUNTER — Telehealth: Payer: Self-pay

## 2023-11-19 NOTE — Transitions of Care (Post Inpatient/ED Visit) (Unsigned)
   11/19/2023  Name: [name] Pratt MRN: [id number] DOB: 1940/11/29  Today's TOC FU Call Status: Today's TOC FU Call Status:: Unsuccessful Call (1st Attempt) Unsuccessful Call (1st Attempt) Date: 11/19/23  Attempted to reach the patient regarding the most recent Inpatient/ED visit.  Follow Up Plan: Additional outreach attempts will be made to reach the patient to complete the Transitions of Care (Post Inpatient/ED visit) call.   Signature Karena Addison, LPN [facility] Nurse Health Advisor Direct Dial [phone]

## 2023-11-20 ENCOUNTER — Encounter: Payer: Self-pay | Admitting: Physician Assistant

## 2023-11-20 ENCOUNTER — Ambulatory Visit (INDEPENDENT_AMBULATORY_CARE_PROVIDER_SITE_OTHER): Admitting: Physician Assistant

## 2023-11-20 VITALS — BP 154/68 | HR 62 | Temp 98.0°F | Resp 16 | Ht 66.0 in | Wt 156.0 lb

## 2023-11-20 DIAGNOSIS — I10 Essential (primary) hypertension: Secondary | ICD-10-CM | POA: Diagnosis not present

## 2023-11-20 NOTE — Assessment & Plan Note (Signed)
 Appears to be improving. Cont amlodipine 2.5 mg bid, hydrochlorothiazide 25 mg , valsaratan 320 mg.  Will check bmp today F/u 1 mo w/ pcp

## 2023-11-20 NOTE — Progress Notes (Signed)
 Established patient visit   Patient: [name] Pratt   DOB: 05-11-1941   83 y.o. Male  MRN: [id number] Visit Date: 11/20/2023  Today's healthcare provider: Alfredia Ferguson, PA-C   Cc. Htn f/u  Subjective    Hypertension, follow-up  BP Readings from Last 3 Encounters:  11/20/23 (!) 154/68  11/18/23 (!) 174/72  11/13/23 (!) 160/62   Wt Readings from Last 3 Encounters:  11/20/23 156 lb (70.8 kg)  11/18/23 158 lb 11.7 oz (72 kg)  11/13/23 159 lb (72.1 kg)     He was last seen for hypertension a week ago, amlodipine was increased to 2.5 mg BID and he was started on hydrochlorothiazide 25 mg daily.  Outside blood pressures -- pt brings a few readings, most recently 133/79  Pertinent labs Lab Results  Component Value Date   CHOL 125 01/22/2023   HDL 44.70 01/22/2023   LDLCALC 55 01/22/2023   TRIG 125.0 01/22/2023   CHOLHDL 3 01/22/2023   Lab Results  Component Value Date   NA 141 11/13/2023   K 4.5 11/13/2023   CREATININE 1.35 11/13/2023   GFRNONAA 49 (L) 09/17/2023   GLUCOSE 101 (H) 11/13/2023   TSH 6.78 (H) 10/24/2023     The ASCVD Risk score (Arnett DK, et al., 2019) failed to calculate for the following reasons:   The 2019 ASCVD risk score is only valid for ages 12 to 51  ---------------------------------------------------------------------------------------------------  Medications: Outpatient Medications Prior to Visit  Medication Sig   acetaminophen (TYLENOL) 500 MG tablet Take 500 mg by mouth at bedtime as needed for moderate pain (pain score 4-6).   albuterol (VENTOLIN HFA) 108 (90 Base) MCG/ACT inhaler Inhale 2 puffs into the lungs every 6 (six) hours as needed for wheezing or shortness of breath. INHALE 2 PUFFS INTO THE LUNGS EVERY 6 HOURS AS NEEDED FOR SHORTNESS OF BREATH.   allopurinol (ZYLOPRIM) 100 MG tablet Take 1 tablet (100 mg total) by mouth 2 (two) times daily.   amLODipine (NORVASC) 2.5 MG tablet Take 1 tablet (2.5 mg total) by mouth 2  (two) times daily.   antiseptic oral rinse (BIOTENE) LIQD 15 mLs by Mouth Rinse route 2 (two) times daily.   aspirin EC 81 MG tablet Take 81 mg by mouth at bedtime.   bacitracin ointment Apply 1 Application topically 2 (two) times daily. (Patient taking differently: Apply 1 Application topically daily as needed for wound care.)   cholestyramine (QUESTRAN) 4 GM/DOSE powder Take 4 g by mouth as needed (digestion). 30 ml   cycloSPORINE (RESTASIS) 0.05 % ophthalmic emulsion Place 1 drop into both eyes 2 (two) times daily.   fluticasone (FLONASE) 50 MCG/ACT nasal spray Place 1 spray into both nostrils at bedtime.   gabapentin (NEURONTIN) 300 MG capsule Take 1 capsule (300 mg total) by mouth at bedtime.   glucose blood test strip TRUE Metrix Blood Glucose Test Strip, use as instructed   hydrALAZINE (APRESOLINE) 50 MG tablet Take 1 tablet (50 mg total) by mouth 3 (three) times daily.   hydrochlorothiazide (HYDRODIURIL) 25 MG tablet Take 1 tablet (25 mg total) by mouth daily.   Iron, Ferrous Sulfate, 325 (65 Fe) MG TABS Take 325 mg by mouth every other day. (Patient taking differently: Take 325 mg by mouth daily.)   Lancets MISC 1 each by Does not apply route 2 (two) times daily. TRUE matrix lancets   levothyroxine (SYNTHROID) 112 MCG tablet Take 1 tablet (112 mcg total) by mouth daily before breakfast. Take  in addition to 50 mcg tablet   levothyroxine (SYNTHROID) 50 MCG tablet Take 1 tablet (50 mcg total) by mouth daily before breakfast. (Take in addition to 112 mcg tablet)   loratadine (CLARITIN) 10 MG tablet Take 10 mg by mouth daily as needed for allergies or rhinitis.   Magnesium 500 MG TABS Take 500 mg by mouth daily.   Menthol, Topical Analgesic, (ICY HOT EX) Apply 1 Application topically at bedtime. Lidocaine / on back   metroNIDAZOLE (METROCREAM) 0.75 % cream Apply 1 Application topically daily as needed (head).   multivitamin-lutein (OCUVITE-LUTEIN) CAPS capsule Take 1 capsule by mouth daily.    nitroGLYCERIN (NITROSTAT) 0.4 MG SL tablet Place 1 tablet (0.4 mg total) under the tongue every 5 (five) minutes as needed for chest pain.   OVER THE COUNTER MEDICATION Place 1 spray into both nostrils 2 (two) times daily. Sinus saline   OVER THE COUNTER MEDICATION Take 15 mLs by mouth at bedtime. Yellow Mustard   polyethylene glycol powder (GLYCOLAX/MIRALAX) 17 GM/SCOOP powder Take 17 g by mouth 2 (two) times daily as needed for mild constipation, moderate constipation or severe constipation.   polyvinyl alcohol (ARTIFICIAL TEARS) 1.4 % ophthalmic solution Place 1 drop into both eyes daily as needed for dry eyes.   PREBIOTIC PRODUCT PO Take 1 tablet by mouth daily.   PRESCRIPTION MEDICATION CPAP- At bedtime and during any time of rest   rosuvastatin (CRESTOR) 5 MG tablet Take 1 tablet (5 mg total) by mouth daily.   senna-docusate (SENOKOT-S) 8.6-50 MG tablet Take 2 tablets by mouth 2 (two) times daily.   valsartan (DIOVAN) 320 MG tablet Take 1 tablet (320 mg total) by mouth daily.   vitamin B-12 (CYANOCOBALAMIN) 500 MCG tablet Take 500 mcg by mouth in the morning.   No facility-administered medications prior to visit.    Review of Systems  Constitutional:  Negative for fatigue and fever.  Respiratory:  Negative for cough and shortness of breath.   Cardiovascular:  Negative for chest pain, palpitations and leg swelling.  Neurological:  Negative for dizziness and headaches.       Objective    BP (!) 154/68 (BP Location: Right Arm, Patient Position: Sitting, Cuff Size: Small)   Pulse 62   Temp 98 F (36.7 C) (Oral)   Resp 16   Ht 5\' 6"  (1.676 m)   Wt 156 lb (70.8 kg)   SpO2 97%   BMI 25.18 kg/m    Physical Exam Constitutional:      General: He is awake.     Appearance: He is well-developed.  HENT:     Head: Normocephalic.  Eyes:     Conjunctiva/sclera: Conjunctivae normal.  Cardiovascular:     Rate and Rhythm: Normal rate and regular rhythm.     Heart sounds: Normal heart  sounds.  Pulmonary:     Effort: Pulmonary effort is normal.  Skin:    General: Skin is warm.  Neurological:     Mental Status: He is alert and oriented to person, place, and time.  Psychiatric:        Attention and Perception: Attention normal.        Mood and Affect: Mood normal.        Speech: Speech normal.        Behavior: Behavior is cooperative.      No results found for any visits on 11/20/23.  Assessment & Plan    Hypertension, unspecified type Assessment & Plan: Appears to be improving. Cont amlodipine  2.5 mg bid, hydrochlorothiazide 25 mg , valsaratan 320 mg.  Will check bmp today F/u 1 mo w/ pcp  Orders: -     Basic metabolic panel     Return in about 4 weeks (around 12/18/2023) for hypertension.       Alfredia Ferguson, PA-C  [facility] [phone] (phone) [phone] (fax)  [facility]

## 2023-11-21 ENCOUNTER — Other Ambulatory Visit: Payer: Self-pay | Admitting: Physician Assistant

## 2023-11-21 DIAGNOSIS — I10 Essential (primary) hypertension: Secondary | ICD-10-CM

## 2023-11-21 LAB — BASIC METABOLIC PANEL
BUN: 36 mg/dL — ABNORMAL HIGH (ref 6–23)
CO2: 29 meq/L (ref 19–32)
Calcium: 9.7 mg/dL (ref 8.4–10.5)
Chloride: 101 meq/L (ref 96–112)
Creatinine, Ser: 1.77 mg/dL — ABNORMAL HIGH (ref 0.40–1.50)
GFR: 35.02 mL/min — ABNORMAL LOW (ref >60.00)
Glucose, Bld: 114 mg/dL — ABNORMAL HIGH (ref 70–99)
Potassium: 4.6 meq/L (ref 3.5–5.1)
Sodium: 141 meq/L (ref 135–145)

## 2023-11-21 NOTE — Transitions of Care (Post Inpatient/ED Visit) (Signed)
   11/21/2023  Name: AMMON MUSCATELLO MRN: [id number] DOB: 1941-02-01  Today's TOC FU Call Status: Today's TOC FU Call Status:: Unsuccessful Call (1st Attempt) Unsuccessful Call (1st Attempt) Date: 11/19/23  Attempted to reach the patient regarding the most recent Inpatient/ED visit.  Follow Up Plan: No further outreach attempts will be made at this time. We have been unable to contact the patient. Patient already seen in office Signature Karena Addison, LPN [facility] Nurse Health Advisor Direct Dial [phone]

## 2023-11-26 DIAGNOSIS — N1832 Chronic kidney disease, stage 3b: Secondary | ICD-10-CM | POA: Diagnosis not present

## 2023-11-26 DIAGNOSIS — D649 Anemia, unspecified: Secondary | ICD-10-CM | POA: Diagnosis not present

## 2023-11-26 DIAGNOSIS — N2581 Secondary hyperparathyroidism of renal origin: Secondary | ICD-10-CM | POA: Diagnosis not present

## 2023-11-26 DIAGNOSIS — I129 Hypertensive chronic kidney disease with stage 1 through stage 4 chronic kidney disease, or unspecified chronic kidney disease: Secondary | ICD-10-CM | POA: Diagnosis not present

## 2023-11-26 DIAGNOSIS — Z87442 Personal history of urinary calculi: Secondary | ICD-10-CM | POA: Diagnosis not present

## 2023-11-27 LAB — LAB REPORT - SCANNED
Albumin, Urine POC: 53.6
Creatinine, POC: 38.6 mg/dL
EGFR: 43
Microalb Creat Ratio: 139

## 2023-11-28 DIAGNOSIS — H35073 Retinal telangiectasis, bilateral: Secondary | ICD-10-CM | POA: Diagnosis not present

## 2023-11-28 DIAGNOSIS — H353211 Exudative age-related macular degeneration, right eye, with active choroidal neovascularization: Secondary | ICD-10-CM | POA: Diagnosis not present

## 2023-11-28 DIAGNOSIS — H35353 Cystoid macular degeneration, bilateral: Secondary | ICD-10-CM | POA: Diagnosis not present

## 2023-11-30 ENCOUNTER — Other Ambulatory Visit (INDEPENDENT_AMBULATORY_CARE_PROVIDER_SITE_OTHER)

## 2023-11-30 ENCOUNTER — Other Ambulatory Visit: Payer: Self-pay

## 2023-11-30 DIAGNOSIS — I10 Essential (primary) hypertension: Secondary | ICD-10-CM

## 2023-11-30 DIAGNOSIS — E039 Hypothyroidism, unspecified: Secondary | ICD-10-CM | POA: Diagnosis not present

## 2023-11-30 LAB — BASIC METABOLIC PANEL
BUN: 41 mg/dL — ABNORMAL HIGH (ref 6–23)
CO2: 28 meq/L (ref 19–32)
Calcium: 9.8 mg/dL (ref 8.4–10.5)
Chloride: 103 meq/L (ref 96–112)
Creatinine, Ser: 1.66 mg/dL — ABNORMAL HIGH (ref 0.40–1.50)
GFR: 37.81 mL/min — ABNORMAL LOW (ref >60.00)
Glucose, Bld: 94 mg/dL (ref 70–99)
Potassium: 4.2 meq/L (ref 3.5–5.1)
Sodium: 140 meq/L (ref 135–145)

## 2023-11-30 LAB — TSH: TSH: 2.26 u[IU]/mL (ref 0.35–5.50)

## 2023-11-30 NOTE — Progress Notes (Signed)
 2nd lab visit for [name] Pratt's labs.  Charges filed in earlier lab appt with [name] Pratt.

## 2023-12-01 ENCOUNTER — Encounter: Payer: Self-pay | Admitting: Family

## 2023-12-03 NOTE — Progress Notes (Signed)
 Patient notified of results.

## 2023-12-06 ENCOUNTER — Ambulatory Visit: Attending: Cardiology | Admitting: Cardiology

## 2023-12-06 ENCOUNTER — Encounter: Payer: Self-pay | Admitting: Cardiology

## 2023-12-06 VITALS — BP 138/70 | HR 64 | Ht 66.0 in | Wt 156.0 lb

## 2023-12-06 DIAGNOSIS — E782 Mixed hyperlipidemia: Secondary | ICD-10-CM | POA: Diagnosis not present

## 2023-12-06 DIAGNOSIS — I10 Essential (primary) hypertension: Secondary | ICD-10-CM

## 2023-12-06 DIAGNOSIS — I700 Atherosclerosis of aorta: Secondary | ICD-10-CM

## 2023-12-06 NOTE — Progress Notes (Signed)
 Cardiology Office Note:    Date:  12/06/2023   ID:  [name] Pratt, DOB September 30, 1940, MRN [id number]  PCP:  [name], NP  Cardiologist:  Garwin Brothers, MD   Referring MD: [name], NP    ASSESSMENT:    1. Aortic atherosclerosis (HCC)   2. Benign essential hypertension   3. Mixed hyperlipidemia    PLAN:    In order of problems listed above:  Carotid atherosclerosis: Secondary prevention stressed to the patient.  Importance of compliance with diet and medications stressed and he vocalized understanding.  He was advised to ambulate to the best of his ability. Essential hypertension: Blood pressure is stable and diet was emphasized.  Lifestyle modification urged. Mixed dyslipidemia: On lipid-lowering medications followed by primary care.  Lipids reviewed and discussed with the patient.  Found to be satisfactory. Patient will be seen in follow-up appointment in 6 months or earlier if the patient has any concerns.    Medication Adjustments/Labs and Tests Ordered: Current medicines are reviewed at length with the patient today.  Concerns regarding medicines are outlined above.  No orders of the defined types were placed in this encounter.  No orders of the defined types were placed in this encounter.    No chief complaint on file.    History of Present Illness:    [name] Pratt is a 83 y.o. male patient has past medical history of aortic atherosclerosis, essential hypertension, mixed dyslipidemia and takes care of activities of daily living.  He ambulates minimally because of orthopedic issues.  He has had spinal surgery and ambulates with a walker.  At the time of my evaluation, the patient is alert awake oriented and in no distress.  Past Medical History:  Diagnosis Date   Acute kidney injury (HCC) 05/02/2022   Acute metabolic encephalopathy 04/24/2022   Acute respiratory failure with hypoxia (HCC) 09/14/2023   Adenomatous polyp of ascending  colon    Adenomatous polyp of colon    Allergic rhinitis 02/02/2016   Anemia 12/17/2009   Formatting of this note might be different from the original. 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 Hypertension   Benign paroxysmal positional vertigo 09/14/2021   Bilateral sacroiliitis (HCC) 07/22/2021   Body mass index (BMI) 25.0-25.9, adult 11/02/2020   Cancer (HCC)    skin cancer   Cardiac murmur 07/25/2021   Carotid stenosis    a. Carotid U/S 5/13: LICA < 50%, RICA 50-69%;  b.  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   Cystoid macular edema of both eyes 07/28/2020   Degenerative disc disease, lumbar 06/30/2015   Deviated nasal septum 02/18/2015   Diverticulosis 03/03/2020   DJD (degenerative joint disease) of hip 12/24/2012   Drowsiness 12/07/2022  Dry eyes 09/14/2023   Dyspnea 06/12/2011   Emphysema, unspecified (HCC) 03/05/2020   Erectile dysfunction 06/20/2017   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   Fall 09/18/2022   Gait disorder 03/02/2021   Gait instability 03/02/2021   GERD (gastroesophageal reflux disease)    GI bleed 04/21/2022   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    Qualifier: Diagnosis of  By: Kem Parkinson     Greater trochanteric pain syndrome 07/22/2021   Hand pain, left 01/16/2022   Hand weakness 05/02/2022   Hearing loss 02/02/2016   Hematochezia    Heme positive stool 02/29/2020   Hemorrhagic shock (HCC)    HIATAL HERNIA 05/13/2009   Qualifier: Diagnosis of   By: Kem Parkinson      Replacing diagnoses that were inactivated after the 12/11/22 regulatory import     High risk medication use 01/18/2012   Hip pain 03/02/2021   History of chicken pox    History of COVID-19 12/05/2021   History of hemorrhoids    History of kidney stones    History of skin cancer 07/22/2021   skin cancer   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  Cardiology - Dr Estrella Myrtle at [facility] cardiology     ICD-10 cut over     Hyperlipidemia with target LDL less than 130 12/27/2010   Formatting of this note might be different from the original. Cardiology - Dr Estrella Myrtle at [facility] cardiology  ICD-10 cut over   Hypokalemia 07/08/2021   Hypothyroidism    Internal hemorrhoids 03/03/2020   Iron deficiency anemia 09/18/2022   Irregular heart beat 02/03/2022   Leg cramps 11/13/2019   Lower GI bleed 12/12/2012   Lumbago of lumbar region with sciatica 10/21/2020   Lumbar radiculopathy 03/02/2021   Lumbar spondylosis 11/02/2020   NEPHROLITHIASIS 05/13/2009   Qualifier: Diagnosis of  By: Kem Parkinson     Nonspecific abnormal electrocardiogram (ECG) (EKG) 01/06/2013   Numbness of hand 01/24/2022   Onychomycosis 06/30/2015   OSA (obstructive sleep apnea) 11/25/2012   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 Osteoarthritis Of The Hand  10/1 IMO update   Osteoarthrosis,  unspecified whether generalized or localized, pelvic region and thigh 07/25/2010   Formatting of this note might be different from the original. Osteoarthritis Of The Hip   Other abnormal glucose 07/22/2021   Other allergic rhinitis 02/18/2015   Palpitations 07/25/2021   Peripheral neuropathy 06/19/2016   Pneumonia of both lower lobes due to infectious organism 09/27/2023   Preop examination 12/06/2022   Preventative health care 11/24/2015   Right groin pain 11/29/2012   Right inguinal hernia 05/13/2009   Qualifier: Diagnosis of  By: Kem Parkinson     RLS (restless legs syndrome) 02/18/2015   Rosacea    Rotator cuff tear 07/27/2013   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   Sinus bradycardia 09/14/2023   Situational depression 03/02/2021   Skin lesion 01/16/2022   Skin tear of forearm without complication, left, subsequent encounter 09/18/2022   Sleep apnea with use of continuous positive airway pressure (CPAP) 07/15/2013   CPAP set to  7 cm water,  Residual AHi was 7.8  With no leak.  User time 6 hours.  479 days , not used only 12 days - highly compliant 06-23-13  .    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 Fawn Kirk   Ulnar neuropathy 01/24/2022   Urinary retention 11/26/2020   Vasovagal syncope    Ventral hernia 07/08/2012   Vertigo 07/08/2021   Vitamin D deficiency 08/07/2022   Weakness of lower extremity 10/10/2022   Weight loss 03/03/2020    Past Surgical History:  Procedure Laterality Date   ABDOMINAL HERNIA REPAIR  07/15/12   Dr Ashley Jacobs   ANTERIOR CERVICAL DECOMP/DISCECTOMY FUSION N/A 07/09/2020   Procedure: ANTERIOR CERVICAL  DECOMPRESSION AND FUSION CERVICAL THREE-FOUR.;  Surgeon: Donalee Citrin, MD;  Location: [facility] OR;  Service: Neurosurgery;  Laterality: N/A;  anterior   BROW LIFT  05/07/01   COLONOSCOPY WITH PROPOFOL N/A 04/24/2022   Procedure: COLONOSCOPY WITH PROPOFOL;  Surgeon: Tressia Danas, MD;  Location: [facility] ENDOSCOPY;  Service: Gastroenterology;  Laterality: N/A;   COLONOSCOPY WITH PROPOFOL N/A 04/26/2022   Procedure: COLONOSCOPY WITH PROPOFOL;  Surgeon: Tressia Danas, MD;  Location: [facility] ENDOSCOPY;  Service: Gastroenterology;  Laterality: N/A;   CYSTOSCOPY WITH URETEROSCOPY AND STENT PLACEMENT Right 01/28/2014   Procedure: CYSTOSCOPY WITH URETEROSCOPY, BASKET RETRIVAL AND  STENT PLACEMENT;  Surgeon: Valetta Fuller, MD;  Location: [facility] ORS;  Service: Urology;  Laterality: Right;   epidural injections     multiple procedures   ESOPHAGEAL DILATION  1993 and 1994   multiple times   EYE SURGERY Bilateral 03/25/10, 2012   cataract removal   HEMORRHOID SURGERY  2014   HIATAL HERNIA REPAIR  12/21/93   HOLMIUM LASER APPLICATION Right 01/28/2014   Procedure: HOLMIUM LASER APPLICATION;  Surgeon: Valetta Fuller, MD;  Location: [facility] ORS;  Service: Urology;  Laterality: Right;   INGUINAL HERNIA REPAIR Right 07/15/12   Dr Ashley Jacobs, x2   JOINT REPLACEMENT  07/25/04   right knee   JOINT REPLACEMENT  07/13/10   left knee   KNEE SURGERY  1995   LAPAROSCOPIC CHOLECYSTECTOMY  09/20/06   with intraoperative cholangiogram and right inguinal herniorrhaphy with mesh    LIGAMENT REPAIR Left 07/2013   shoulder   LITHOTRIPSY     x2   LUMBAR LAMINECTOMY/DECOMPRESSION MICRODISCECTOMY Bilateral 12/27/2022   Procedure: Lumbar Two-Three, Lumbar Three-Four Sublaminar Decompression;  Surgeon: Donalee Citrin, MD;  Location: [facility] OR;  Service: Neurosurgery;  Laterality: Bilateral;   NASAL SEPTUM SURGERY  1975   POLYPECTOMY  04/26/2022   Procedure: POLYPECTOMY;  Surgeon: Tressia Danas, MD;  Location: [facility] ENDOSCOPY;  Service: Gastroenterology;;    POSTERIOR CERVICAL FUSION/FORAMINOTOMY N/A 11/24/2020   Procedure: Posterior Cervical Laminectomy Cervical three-four, Cervical four-five with lateral mass fusion;  Surgeon: Donalee Citrin, MD;  Location: [name] OR;  Service: Neurosurgery;  Laterality: N/A;   REFRACTIVE SURGERY Bilateral    Right total hip replacement  4/14   SKIN CANCER DESTRUCTION     nose, ear   TONSILLECTOMY  as child   TOTAL HIP ARTHROPLASTY Left    URETHRAL DILATION  1991   Dr. Annabell Howells    Current Medications: Current Meds  Medication Sig   acetaminophen (TYLENOL) 500 MG tablet Take 500 mg  by mouth at bedtime as needed for moderate pain (pain score 4-6).   albuterol (VENTOLIN HFA) 108 (90 Base) MCG/ACT inhaler Inhale 2 puffs into the lungs every 6 (six) hours as needed for wheezing or shortness of breath. INHALE 2 PUFFS INTO THE LUNGS EVERY 6 HOURS AS NEEDED FOR SHORTNESS OF BREATH.   allopurinol (ZYLOPRIM) 100 MG tablet Take 1 tablet (100 mg total) by mouth 2 (two) times daily.   amLODipine (NORVASC) 2.5 MG tablet Take 1 tablet (2.5 mg total) by mouth 2 (two) times daily.   antiseptic oral rinse (BIOTENE) LIQD 15 mLs by Mouth Rinse route 2 (two) times daily.   aspirin EC 81 MG tablet Take 81 mg by mouth at bedtime.   bacitracin ointment Apply 1 Application topically 2 (two) times daily.   cholestyramine (QUESTRAN) 4 GM/DOSE powder Take 4 g by mouth as needed (digestion). 30 ml   fluticasone (FLONASE) 50 MCG/ACT nasal spray Place 1 spray into both nostrils at bedtime.   gabapentin (NEURONTIN) 300 MG capsule Take 1 capsule (300 mg total) by mouth at bedtime.   glucose blood test strip TRUE Metrix Blood Glucose Test Strip, use as instructed   hydrALAZINE (APRESOLINE) 50 MG tablet Take 1 tablet (50 mg total) by mouth 3 (three) times daily.   hydrochlorothiazide (HYDRODIURIL) 25 MG tablet Take 1 tablet (25 mg total) by mouth daily.   Lancets MISC 1 each by Does not apply route 2 (two) times daily. TRUE matrix lancets    levothyroxine (SYNTHROID) 112 MCG tablet Take 1 tablet (112 mcg total) by mouth daily before breakfast. Take in addition to 50 mcg tablet   levothyroxine (SYNTHROID) 50 MCG tablet Take 1 tablet (50 mcg total) by mouth daily before breakfast. (Take in addition to 112 mcg tablet)   loratadine (CLARITIN) 10 MG tablet Take 10 mg by mouth daily as needed for allergies or rhinitis.   Magnesium 500 MG TABS Take 500 mg by mouth daily.   Menthol, Topical Analgesic, (ICY HOT EX) Apply 1 Application topically at bedtime. Lidocaine / on back   metroNIDAZOLE (METROCREAM) 0.75 % cream Apply 1 Application topically daily as needed (head).   multivitamin-lutein (OCUVITE-LUTEIN) CAPS capsule Take 1 capsule by mouth daily.   nitroGLYCERIN (NITROSTAT) 0.4 MG SL tablet Place 1 tablet (0.4 mg total) under the tongue every 5 (five) minutes as needed for chest pain.   OVER THE COUNTER MEDICATION Place 1 spray into both nostrils 2 (two) times daily. Sinus saline   OVER THE COUNTER MEDICATION Take 15 mLs by mouth at bedtime. Yellow Mustard   polyethylene glycol powder (GLYCOLAX/MIRALAX) 17 GM/SCOOP powder Take 17 g by mouth 2 (two) times daily as needed for mild constipation, moderate constipation or severe constipation.   polyvinyl alcohol (ARTIFICIAL TEARS) 1.4 % ophthalmic solution Place 1 drop into both eyes daily as needed for dry eyes.   PREBIOTIC PRODUCT PO Take 1 tablet by mouth daily.   rosuvastatin (CRESTOR) 5 MG tablet Take 1 tablet (5 mg total) by mouth daily.   senna-docusate (SENOKOT-S) 8.6-50 MG tablet Take 2 tablets by mouth 2 (two) times daily.   valsartan (DIOVAN) 320 MG tablet Take 1 tablet (320 mg total) by mouth daily.   vitamin B-12 (CYANOCOBALAMIN) 500 MCG tablet Take 500 mcg by mouth in the morning.     Allergies:   Pravastatin, Sildenafil, Oxycodone, Atenolol, and Metoclopramide hcl   Social History   Socioeconomic History   Marital status: Married    Spouse name: Britta Mccreedy  Number of  children: 1   Years of education: Not on file   Highest education level: Not on file  Occupational History   Occupation: firefighter    Comment: paints on the side   Occupation: retired  Tobacco Use   Smoking status: Former    Current packs/day: 0.00    Types: Cigarettes    Start date: 09/12/1967    Quit date: 09/11/1978    Years since quitting: 45.2   Smokeless tobacco: Never  Vaping Use   Vaping status: Never Used  Substance and Sexual Activity   Alcohol use: Not Currently    Alcohol/week: 0.0 standard drinks of alcohol    Comment: rare   Drug use: No   Sexual activity: Not Currently  Other Topics Concern   Not on file  Social History Narrative   Lives with his wife   He has one son- lives locally.   2 grandchildren   Has worked for Warden/ranger   Enjoys wood working   Completed HS.  [employer]   Social Drivers of Corporate investment banker Strain: Low Risk  (10/28/2021)   Overall Financial Resource Strain (CARDIA)    Difficulty of Paying Living Expenses: Not very hard  Food Insecurity: No Food Insecurity (09/18/2023)   Hunger Vital Sign    Worried About Running Out of Food in the Last Year: Never true    Ran Out of Food in the Last Year: Never true  Transportation Needs: No Transportation Needs (09/18/2023)   PRAPARE - Administrator, Civil Service (Medical): No    Lack of Transportation (Non-Medical): No  Physical Activity: Inactive (11/13/2023)   Exercise Vital Sign    Days of Exercise per Week: 0 days    Minutes of Exercise per Session: 0 min  Stress: Stress Concern Present (11/13/2023)   [employer] of Occupational Health - Occupational Stress Questionnaire    Feeling of Stress : To some extent  Social Connections: Moderately Isolated (09/14/2023)   Social Connection and Isolation Panel [NHANES]    Frequency of Communication with Friends and Family: Three times a week    Frequency of Social Gatherings with Friends and Family: Three times a week     Attends Religious Services: Never    Active Member of Clubs or Organizations: No    Attends Banker Meetings: Never    Marital Status: Married     Family History: The patient's family history includes Cancer in his mother and another family member; Hypertension in an other family member; Osteoarthritis in an other family member. There is no history of Stomach cancer, Colon cancer, or Esophageal cancer.  ROS:   Please see the history of present illness.    All other systems reviewed and are negative.  EKGs/Labs/Other Studies Reviewed:    The following studies were reviewed today: I discussed my findings with the patient at length   Recent Labs: 09/13/2023: B Natriuretic Peptide 232.3 11/13/2023: ALT 14; Hemoglobin 13.1; Platelets 406.0 11/30/2023: BUN 41; Creatinine, Ser 1.66; Potassium 4.2; Sodium 140; TSH 2.26  Recent Lipid Panel    Component Value Date/Time   CHOL 125 01/22/2023 1144   TRIG 125.0 01/22/2023 1144   HDL 44.70 01/22/2023 1144   CHOLHDL 3 01/22/2023 1144   VLDL 25.0 01/22/2023 1144   LDLCALC 55 01/22/2023 1144   LDLCALC 50 06/15/2020 1235    Physical Exam:    VS:  BP 138/70   Pulse 64   Ht 5\' 6"  (1.676  m)   Wt 156 lb (70.8 kg)   SpO2 97%   BMI 25.18 kg/m     Wt Readings from Last 3 Encounters:  12/06/23 156 lb (70.8 kg)  11/20/23 156 lb (70.8 kg)  11/18/23 158 lb 11.7 oz (72 kg)     GEN: Patient is in no acute distress HEENT: Normal NECK: No JVD; No carotid bruits LYMPHATICS: No lymphadenopathy CARDIAC: Hear sounds regular, 2/6 systolic murmur at the apex. RESPIRATORY:  Clear to auscultation without rales, wheezing or rhonchi  ABDOMEN: Soft, non-tender, non-distended MUSCULOSKELETAL:  No edema; No deformity  SKIN: Warm and dry NEUROLOGIC:  Alert and oriented x 3 PSYCHIATRIC:  Normal affect   Signed, Garwin Brothers, MD  12/06/2023 8:35 AM    [facility]

## 2023-12-06 NOTE — Patient Instructions (Signed)
 Medication Instructions:  Your physician recommends that you continue on your current medications as directed. Please refer to the Current Medication list given to you today.  *If you need a refill on your cardiac medications before your next appointment, please call your pharmacy*   Lab Work: None ordered If you have labs (blood work) drawn today and your tests are completely normal, you will receive your results only by: MyChart Message (if you have MyChart) OR A paper copy in the mail If you have any lab test that is abnormal or we need to change your treatment, we will call you to review the results.   Testing/Procedures: None ordered   Follow-Up: At [facility], you and your health needs are our priority.  As part of our continuing mission to provide you with exceptional heart care, we have created designated Provider Care Teams.  These Care Teams include your primary Cardiologist (physician) and Advanced Practice Providers (APPs -  Physician Assistants and Nurse Practitioners) who all work together to provide you with the care you need, when you need it.  We recommend signing up for the patient portal called "MyChart".  Sign up information is provided on this After Visit Summary.  MyChart is used to connect with patients for Virtual Visits (Telemedicine).  Patients are able to view lab/test results, encounter notes, upcoming appointments, etc.  Non-urgent messages can be sent to your provider as well.   To learn more about what you can do with MyChart, go to [email or web address].    Your next appointment:   12 month(s)  The format for your next appointment:   In Person  Provider:   Belva Crome, MD    Other Instructions none  Important Information About Sugar

## 2023-12-07 ENCOUNTER — Other Ambulatory Visit: Payer: Medicare HMO

## 2023-12-10 ENCOUNTER — Telehealth: Payer: Self-pay | Admitting: Family

## 2023-12-10 NOTE — Telephone Encounter (Signed)
 Per patient "he figured it out"

## 2023-12-10 NOTE — Telephone Encounter (Signed)
 Copied from CRM [id number]. Topic: General - Other >> Dec 10, 2023  8:58 AM Tiffany S wrote: Reason for CRM: Patient has questions about form mailed to him he wasn't sure of the type of form it was Please follow up with patient

## 2023-12-10 NOTE — Telephone Encounter (Signed)
 Called patient a few times but no answer, left voice mail for patient to call back.

## 2023-12-12 ENCOUNTER — Other Ambulatory Visit (HOSPITAL_BASED_OUTPATIENT_CLINIC_OR_DEPARTMENT_OTHER): Payer: Self-pay

## 2023-12-12 ENCOUNTER — Ambulatory Visit (INDEPENDENT_AMBULATORY_CARE_PROVIDER_SITE_OTHER): Admitting: Family

## 2023-12-12 VITALS — BP 127/51 | HR 62 | Temp 97.9°F | Resp 16 | Ht 66.0 in | Wt 153.0 lb

## 2023-12-12 DIAGNOSIS — I10 Essential (primary) hypertension: Secondary | ICD-10-CM | POA: Diagnosis not present

## 2023-12-12 NOTE — Progress Notes (Signed)
 Subjective:     Patient ID: [name] Pratt, male    DOB: May 03, 1941, 83 y.o.   MRN: [id number]  Chief Complaint  Patient presents with   Hypertension    Here for follow up    Hypertension    Discussed the use of AI scribe software for clinical note transcription with the patient, who gave verbal consent to proceed.  History of Present Illness  The patient presents for a follow-up after starting hydrochlorothiazide for hypertension management.  Blood pressure readings at home have been mostly in the 140s to 150s systolic over high 70s diastolic, with occasional lower readings such as 101 systolic and another in the 120s. He is currently taking amlodipine 2.5 mg twice daily and is on the maximum dose of valsartan.  Since starting hydrochlorothiazide, he has experienced urinary urgency and frequency, stating that 'everything is really bothering him' and he 'can't seem to get to the bathroom quickly enough.' This has been a persistent issue, and he has discussed it with his urologist in the past, who prescribed a medication that he feels does not help much.  He experiences fatigue, often falling asleep while reading, which was an issue even before starting the current medication regimen. No dizziness.     Health Maintenance Due  Topic Date Due   FOOT EXAM  09/29/2021   Medicare Annual Wellness (AWV)  01/20/2023    Past Medical History:  Diagnosis Date   Acute kidney injury (HCC) 05/02/2022   Acute metabolic encephalopathy 04/24/2022   Acute respiratory failure with hypoxia (HCC) 09/14/2023   Adenomatous polyp of ascending colon    Adenomatous polyp of colon    Allergic rhinitis 02/02/2016   Anemia 12/17/2009   Formatting of this note might be different from the original. 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 Hypertension   Benign paroxysmal positional vertigo 09/14/2021   Bilateral sacroiliitis (HCC) 07/22/2021   Body mass index (BMI) 25.0-25.9, adult 11/02/2020   Cancer (HCC)    skin cancer   Cardiac murmur 07/25/2021   Carotid stenosis    a. Carotid U/S 5/13: LICA < 50%, RICA 50-69%;  b.  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   Cystoid macular edema of both eyes 07/28/2020   Degenerative disc disease, lumbar 06/30/2015   Deviated nasal septum 02/18/2015   Diverticulosis 03/03/2020   DJD (degenerative joint disease) of hip 12/24/2012   Drowsiness 12/07/2022   Dry eyes 09/14/2023   Dyspnea 06/12/2011   Emphysema, unspecified (HCC) 03/05/2020   Erectile dysfunction 06/20/2017   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   Fall 09/18/2022   Gait disorder 03/02/2021   Gait instability 03/02/2021   GERD (gastroesophageal reflux disease)  GI bleed 04/21/2022   GOUT 05/13/2009   Qualifier: Diagnosis of  By: Kem Parkinson     Greater trochanteric pain syndrome 07/22/2021   Hand pain, left 01/16/2022   Hand weakness 05/02/2022   Hearing loss 02/02/2016   Hematochezia    Heme positive stool 02/29/2020   Hemorrhagic shock (HCC)    HIATAL HERNIA 05/13/2009   Qualifier: Diagnosis of   By: Kem Parkinson      Replacing  diagnoses that were inactivated after the 12/11/22 regulatory import     High risk medication use 01/18/2012   Hip pain 03/02/2021   History of chicken pox    History of COVID-19 12/05/2021   History of hemorrhoids    History of kidney stones    History of skin cancer 07/22/2021   skin cancer   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  Cardiology - Dr Estrella Myrtle at [facility] cardiology     ICD-10 cut over     Hyperlipidemia with target LDL less than 130 12/27/2010   Formatting of this note might be different from the original. Cardiology - Dr Estrella Myrtle at [facility] cardiology  ICD-10 cut over   Hypokalemia 07/08/2021   Hypothyroidism    Internal hemorrhoids 03/03/2020   Iron deficiency anemia 09/18/2022   Irregular heart beat 02/03/2022   Leg cramps 11/13/2019   Lower GI bleed 12/12/2012   Lumbago of lumbar region with sciatica 10/21/2020   Lumbar radiculopathy 03/02/2021   Lumbar spondylosis 11/02/2020   NEPHROLITHIASIS 05/13/2009   Qualifier: Diagnosis of  By: Kem Parkinson     Nonspecific abnormal electrocardiogram (ECG) (EKG) 01/06/2013   Numbness of hand 01/24/2022   Onychomycosis 06/30/2015   OSA (obstructive sleep apnea) 11/25/2012   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 Osteoarthritis Of The Hand  10/1 IMO update   Osteoarthrosis, unspecified whether generalized or localized, pelvic region and thigh 07/25/2010   Formatting of this note might be different from the original. Osteoarthritis Of The Hip   Other abnormal glucose 07/22/2021   Other allergic rhinitis 02/18/2015   Palpitations 07/25/2021   Peripheral neuropathy 06/19/2016   Pneumonia of both lower lobes due to infectious  organism 09/27/2023   Preop examination 12/06/2022   Preventative health care 11/24/2015   Right groin pain 11/29/2012   Right inguinal hernia 05/13/2009   Qualifier: Diagnosis of  By: Kem Parkinson     RLS (restless legs syndrome) 02/18/2015   Rosacea    Rotator cuff tear 07/27/2013   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   Sinus bradycardia 09/14/2023   Situational depression 03/02/2021   Skin lesion 01/16/2022   Skin tear of forearm without complication, left, subsequent encounter 09/18/2022   Sleep apnea with use of continuous positive airway pressure (CPAP) 07/15/2013   CPAP set to  7 cm water,  Residual AHi was 7.8  With no leak.  User time 6 hours.  479 days , not used only 12 days - highly compliant 06-23-13  .    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 Fawn Kirk   Ulnar neuropathy 01/24/2022   Urinary retention 11/26/2020   Vasovagal syncope    Ventral hernia 07/08/2012   Vertigo 07/08/2021   Vitamin D deficiency 08/07/2022   Weakness of lower extremity 10/10/2022   Weight loss 03/03/2020    Past Surgical History:  Procedure Laterality Date   ABDOMINAL HERNIA REPAIR  07/15/12   Dr Ashley Jacobs   ANTERIOR CERVICAL DECOMP/DISCECTOMY FUSION N/A 07/09/2020   Procedure: ANTERIOR CERVICAL DECOMPRESSION AND FUSION CERVICAL THREE-FOUR.;  Surgeon: Donalee Citrin, MD;  Location: [facility] OR;  Service: Neurosurgery;  Laterality: N/A;  anterior   BROW LIFT  05/07/01   COLONOSCOPY WITH PROPOFOL N/A 04/24/2022   Procedure: COLONOSCOPY WITH PROPOFOL;  Surgeon: Tressia Danas, MD;  Location: [facility] ENDOSCOPY;  Service: Gastroenterology;  Laterality: N/A;   COLONOSCOPY  WITH PROPOFOL N/A 04/26/2022   Procedure: COLONOSCOPY WITH PROPOFOL;  Surgeon: Tressia Danas, MD;  Location: [facility] ENDOSCOPY;  Service: Gastroenterology;  Laterality: N/A;   CYSTOSCOPY WITH URETEROSCOPY AND STENT PLACEMENT Right 01/28/2014   Procedure: CYSTOSCOPY WITH URETEROSCOPY, BASKET RETRIVAL AND  STENT PLACEMENT;  Surgeon: Valetta Fuller, MD;  Location: [facility] ORS;  Service: Urology;  Laterality: Right;   epidural injections     multiple procedures   ESOPHAGEAL DILATION  1993 and 1994   multiple times   EYE SURGERY Bilateral 03/25/10, 2012   cataract removal   HEMORRHOID SURGERY  2014   HIATAL HERNIA REPAIR  12/21/93   HOLMIUM LASER APPLICATION Right 01/28/2014   Procedure: HOLMIUM LASER APPLICATION;  Surgeon: Valetta Fuller, MD;  Location: [facility] ORS;  Service: Urology;  Laterality: Right;   INGUINAL HERNIA REPAIR Right 07/15/12   Dr Ashley Jacobs, x2   JOINT REPLACEMENT  07/25/04   right knee   JOINT REPLACEMENT  07/13/10   left knee   KNEE SURGERY  1995   LAPAROSCOPIC CHOLECYSTECTOMY  09/20/06   with intraoperative cholangiogram and right inguinal herniorrhaphy with mesh    LIGAMENT REPAIR Left 07/2013   shoulder   LITHOTRIPSY     x2   LUMBAR LAMINECTOMY/DECOMPRESSION MICRODISCECTOMY Bilateral 12/27/2022   Procedure: Lumbar Two-Three, Lumbar Three-Four Sublaminar Decompression;  Surgeon: Donalee Citrin, MD;  Location: [facility] OR;  Service: Neurosurgery;  Laterality: Bilateral;   NASAL SEPTUM SURGERY  1975   POLYPECTOMY  04/26/2022   Procedure: POLYPECTOMY;  Surgeon: Tressia Danas, MD;  Location: [facility] ENDOSCOPY;  Service: Gastroenterology;;   POSTERIOR CERVICAL FUSION/FORAMINOTOMY N/A 11/24/2020   Procedure: Posterior Cervical Laminectomy Cervical three-four, Cervical four-five with lateral mass fusion;  Surgeon: Donalee Citrin, MD;  Location: [facility] OR;  Service: Neurosurgery;  Laterality: N/A;   REFRACTIVE SURGERY Bilateral    Right total hip replacement  4/14   SKIN CANCER DESTRUCTION     nose, ear    TONSILLECTOMY  as child   TOTAL HIP ARTHROPLASTY Left    URETHRAL DILATION  1991   Dr. Annabell Howells    Family History  Problem Relation Age of Onset   Cancer Mother    Hypertension Other    Cancer Other    Osteoarthritis Other    Stomach cancer Neg Hx    Colon cancer Neg Hx    Esophageal cancer Neg Hx     Social History   Socioeconomic History   Marital status: Married    Spouse name: Britta Mccreedy   Number  of children: 1   Years of education: Not on file   Highest education level: Not on file  Occupational History   Occupation: firefighter    Comment: paints on the side   Occupation: retired  Tobacco Use   Smoking status: Former    Current packs/day: 0.00    Types: Cigarettes    Start date: 09/12/1967    Quit date: 09/11/1978    Years since quitting: 45.2   Smokeless tobacco: Never  Vaping Use   Vaping status: Never Used  Substance and Sexual Activity   Alcohol use: Not Currently    Alcohol/week: 0.0 standard drinks of alcohol    Comment: rare   Drug use: No   Sexual activity: Not Currently  Other Topics Concern   Not on file  Social History Narrative   Lives with his wife   He has one son- lives locally.   2 grandchildren   Has worked for Warden/r[employer]   Enjoys wood working   Completed HS.  [employer]   Social Drivers of Corporate investment banker Strain: Low Risk  (10/28/2021)   Overall Financial Resource Strain (CARDIA)    Difficulty of Paying Living Expenses: Not very hard  Food Insecurity: No Food Insecurity (09/18/2023)   Hunger Vital Sign    Worried About Running Out of Food in the Last Year: Never true    Ran Out of Food in the Last Year: Never true  Transportation Needs: No Transportation Needs (09/18/2023)   PRAPARE - Administrator, Civil Service (Medical): No    Lack of Transportation (Non-Medical): No  Physical Activity: Inactive (11/13/2023)   Exercise Vital Sign    Days of Exercise per Week: 0 days    Minutes of Exercise per Session: 0  min  Stress: Stress Concern Present (11/13/2023)   [employer] of Occupational Health - Occupational Stress Questionnaire    Feeling of Stress : To some extent  Social Connections: Moderately Isolated (09/14/2023)   Social Connection and Isolation Panel [NHANES]    Frequency of Communication with Friends and Family: Three times a week    Frequency of Social Gatherings with Friends and Family: Three times a week    Attends Religious Services: Never    Active Member of Clubs or Organizations: No    Attends Banker Meetings: Never    Marital Status: Married  Catering manager Violence: Not At Risk (09/18/2023)   Humiliation, Afraid, Rape, and Kick questionnaire    Fear of Current or Ex-Partner: No    Emotionally Abused: No    Physically Abused: No    Sexually Abused: No    Outpatient Medications Prior to Visit  Medication Sig Dispense Refill   acetaminophen (TYLENOL) 500 MG tablet Take 500 mg by mouth at bedtime as needed for moderate pain (pain score 4-6).     albuterol (VENTOLIN HFA) 108 (90 Base) MCG/ACT inhaler Inhale 2 puffs into the lungs every 6 (six) hours as needed for wheezing or shortness of breath. INHALE 2 PUFFS INTO THE LUNGS EVERY 6 HOURS AS NEEDED FOR SHORTNESS OF BREATH. 1 each 3   allopurinol (ZYLOPRIM) 100 MG tablet Take 1 tablet (100 mg total) by mouth 2 (two) times daily. 180 tablet 1   amLODipine (NORVASC) 2.5 MG tablet Take 1 tablet (2.5 mg total) by mouth 2 (two) times daily.     antiseptic oral rinse (BIOTENE) LIQD 15 mLs by Mouth Rinse route 2 (two) times daily.  aspirin EC 81 MG tablet Take 81 mg by mouth at bedtime.     bacitracin ointment Apply 1 Application topically 2 (two) times daily. 120 g 0   cholestyramine (QUESTRAN) 4 GM/DOSE powder Take 4 g by mouth as needed (digestion). 30 ml     fluticasone (FLONASE) 50 MCG/ACT nasal spray Place 1 spray into both nostrils at bedtime.     gabapentin (NEURONTIN) 300 MG capsule Take 1 capsule (300 mg  total) by mouth at bedtime. 90 capsule 1   glucose blood test strip TRUE Metrix Blood Glucose Test Strip, use as instructed 100 each 12   hydrALAZINE (APRESOLINE) 50 MG tablet Take 1 tablet (50 mg total) by mouth 3 (three) times daily. 270 tablet 0   hydrochlorothiazide (HYDRODIURIL) 25 MG tablet Take 1 tablet (25 mg total) by mouth daily. 30 tablet 3   Iron, Ferrous Sulfate, 325 (65 Fe) MG TABS Take 325 mg by mouth every other day. (Patient not taking: Reported on 12/06/2023) 30 tablet 0   Lancets MISC 1 each by Does not apply route 2 (two) times daily. TRUE matrix lancets 100 each 3   levothyroxine (SYNTHROID) 112 MCG tablet Take 1 tablet (112 mcg total) by mouth daily before breakfast. Take in addition to 50 mcg tablet 90 tablet 0   levothyroxine (SYNTHROID) 50 MCG tablet Take 1 tablet (50 mcg total) by mouth daily before breakfast. (Take in addition to 112 mcg tablet) 90 tablet 0   loratadine (CLARITIN) 10 MG tablet Take 10 mg by mouth daily as needed for allergies or rhinitis.     Magnesium 500 MG TABS Take 500 mg by mouth daily.     Menthol, Topical Analgesic, (ICY HOT EX) Apply 1 Application topically at bedtime. Lidocaine / on back     metroNIDAZOLE (METROCREAM) 0.75 % cream Apply 1 Application topically daily as needed (head).     multivitamin-lutein (OCUVITE-LUTEIN) CAPS capsule Take 1 capsule by mouth daily.     nitroGLYCERIN (NITROSTAT) 0.4 MG SL tablet Place 1 tablet (0.4 mg total) under the tongue every 5 (five) minutes as needed for chest pain. 25 tablet 6   OVER THE COUNTER MEDICATION Place 1 spray into both nostrils 2 (two) times daily. Sinus saline     OVER THE COUNTER MEDICATION Take 15 mLs by mouth at bedtime. Yellow Mustard     polyethylene glycol powder (GLYCOLAX/MIRALAX) 17 GM/SCOOP powder Take 17 g by mouth 2 (two) times daily as needed for mild constipation, moderate constipation or severe constipation. 255 g 0   polyvinyl alcohol (ARTIFICIAL TEARS) 1.4 % ophthalmic solution  Place 1 drop into both eyes daily as needed for dry eyes.     PREBIOTIC PRODUCT PO Take 1 tablet by mouth daily.     rosuvastatin (CRESTOR) 5 MG tablet Take 1 tablet (5 mg total) by mouth daily. 90 tablet 1   senna-docusate (SENOKOT-S) 8.6-50 MG tablet Take 2 tablets by mouth 2 (two) times daily. 120 tablet 0   valsartan (DIOVAN) 320 MG tablet Take 1 tablet (320 mg total) by mouth daily. 90 tablet 1   vitamin B-12 (CYANOCOBALAMIN) 500 MCG tablet Take 500 mcg by mouth in the morning.     No facility-administered medications prior to visit.    Allergies  Allergen Reactions   Pravastatin Other (See Comments)    Muscle cramps   Sildenafil Other (See Comments)    Tachycardia     Oxycodone Other (See Comments)    Hallucinations    Atenolol Other (See  Comments)    Bradycardia    Metoclopramide Hcl Anxiety    ROS See HPI    Objective:    Physical Exam Constitutional:      General: He is not in acute distress.    Appearance: He is well-developed.  HENT:     Head: Normocephalic and atraumatic.  Cardiovascular:     Rate and Rhythm: Normal rate and regular rhythm.     Heart sounds: No murmur heard. Pulmonary:     Effort: Pulmonary effort is normal. No respiratory distress.     Breath sounds: Normal breath sounds. No wheezing or rales.  Skin:    General: Skin is warm and dry.  Neurological:     Mental Status: He is alert and oriented to person, place, and time.  Psychiatric:        Behavior: Behavior normal.        Thought Content: Thought content normal.      BP (!) 127/51 (BP Location: Right Arm, Patient Position: Sitting, Cuff Size: Small)   Pulse 62   Temp 97.9 F (36.6 C) (Oral)   Resp 16   Ht 5\' 6"  (1.676 m)   Wt 153 lb (69.4 kg)   SpO2 98%   BMI 24.69 kg/m  Wt Readings from Last 3 Encounters:  12/12/23 153 lb (69.4 kg)  12/06/23 156 lb (70.8 kg)  11/20/23 156 lb (70.8 kg)       Assessment & Plan:   Problem List Items Addressed This Visit        Unprioritized   HTN (hypertension) - Primary   BP Readings from Last 3 Encounters:  12/12/23 (!) 127/51  12/06/23 138/70  11/20/23 (!) 154/68   BP looks good today.  Home readings systolic from 120's to 150's.  He is having some fatigue and urinary urgency (chronic but slightly worse with hydrochlorothiazide). He will let me know if these side effects become too bothersome. Continue amlodipine, hydralazine, diovan and hydrochlorothiazide. Bmet up to date. Follow up in 3 months.       I am having Xerxes B. Emery "[name]" maintain his aspirin EC, glucose blood, Lancets, Magnesium, vitamin B-12, loratadine, antiseptic oral rinse, polyvinyl alcohol, Iron (Ferrous Sulfate), bacitracin, PREBIOTIC PRODUCT PO, acetaminophen, fluticasone, (Menthol, Topical Analgesic, (ICY HOT EX)), metroNIDAZOLE, OVER THE COUNTER MEDICATION, cholestyramine, OVER THE COUNTER MEDICATION, multivitamin-lutein, polyethylene glycol powder, gabapentin, rosuvastatin, senna-docusate, albuterol, nitroGLYCERIN, hydrALAZINE, valsartan, levothyroxine, levothyroxine, hydrochlorothiazide, amLODipine, and allopurinol.  No orders of the defined types were placed in this encounter.

## 2023-12-12 NOTE — Progress Notes (Cosign Needed Addendum)
   Established Patient Office Visit  Subjective   Patient ID: [name] Pratt, male    DOB: 1940/10/25  Age: 83 y.o. MRN: [id number]  Chief Complaint  Patient presents with   Hypertension    Here for follow up    83 yo patient presents for follow up of hypertension. Reports that increased urination has been bothersome. Reports he has noticed an increase in urgency but denies dysuria, flank pain, fever, or chills. He states that he has had several loose stools recently, but he does not feel this is out of the norm for him. He denies postural changes. Home blood pressure log reveals 140s-150s/70s. He had two outliers with lower SBP (low 100s and low 120s).   Hypertension   ROS See HPI   Objective:     BP (!) 127/51 (BP Location: Right Arm, Patient Position: Sitting, Cuff Size: Small)   Pulse 62   Temp 97.9 F (36.6 C) (Oral)   Resp 16   Ht 5\' 6"  (1.676 m)   Wt 153 lb (69.4 kg)   SpO2 98%   BMI 24.69 kg/m    Physical Exam Vitals reviewed.  Constitutional:      Appearance: Normal appearance.  HENT:     Head: Normocephalic and atraumatic.     Right Ear: External ear normal.     Left Ear: External ear normal.  Cardiovascular:     Rate and Rhythm: Normal rate and regular rhythm.     Pulses: Normal pulses.     Heart sounds: Normal heart sounds.  Pulmonary:     Effort: Pulmonary effort is normal.     Breath sounds: Normal breath sounds.  Skin:    General: Skin is warm and dry.     Capillary Refill: Capillary refill takes less than 2 seconds.  Neurological:     Mental Status: He is alert and oriented to person, place, and time.  Psychiatric:        Mood and Affect: Mood normal.        Behavior: Behavior normal.      Assessment & Plan:   Hypertension - blood pressure in therapeutic range in office today. The patient reports that his blood pressures have been higher at home with readings of 140s-150s/70s. Continue amlodipine, hydralazine, valsartan, and  hydrochlorothiazide. Return to office for recheck in 3 months.   Urinary urgency - reports this is chronic; however, worse since beginning the hydrochlorothiazide. He was instructed to contact the office if symptoms worsen.   Cristopher Peru, RN

## 2023-12-12 NOTE — Patient Instructions (Signed)
 VISIT SUMMARY:  You came in today for a follow-up visit after starting hydrochlorothiazide for your high blood pressure. We discussed your blood pressure readings at home, your experience with urinary urgency and frequency, and your ongoing fatigue.  YOUR PLAN:  -HYPERTENSION: Hypertension, or high blood pressure, is being managed with hydrochlorothiazide, amlodipine, and valsartan. Your home blood pressure readings have mostly been in the 140s to 150s systolic range, but your in-office reading was 127/51 mmHg. Continue taking your current medications, monitor your blood pressure at home, and report any intolerable urinary symptoms or worsening fatigue. We will reassess your condition in three months unless you need to come in earlier.  -URINARY URGENCY AND FREQUENCY: You are experiencing urinary urgency and frequency, which may be worsened by hydrochlorothiazide. You are already under the care of a urologist for this issue. Please report if these symptoms become intolerable.  -FATIGUE: You have been experiencing fatigue and sleepiness, which may be related to your medication or other factors. These symptoms were present even before starting your current medication regimen. Please report if the fatigue becomes intolerable.  INSTRUCTIONS:  Please continue taking your current medications and monitor your blood pressure at home. Report any intolerable urinary symptoms or worsening fatigue. We will reassess your condition in three months unless you need to come in earlier.

## 2023-12-12 NOTE — Assessment & Plan Note (Addendum)
 BP Readings from Last 3 Encounters:  12/12/23 (!) 127/51  12/06/23 138/70  11/20/23 (!) 154/68   BP looks good today.  Home readings systolic from 120's to 150's.  He is having some fatigue and urinary urgency (chronic but slightly worse with hydrochlorothiazide). He will let me know if these side effects become too bothersome. Continue amlodipine, hydralazine, diovan and hydrochlorothiazide. Bmet up to date. Follow up in 3 months.

## 2024-01-08 ENCOUNTER — Other Ambulatory Visit (HOSPITAL_BASED_OUTPATIENT_CLINIC_OR_DEPARTMENT_OTHER): Payer: Self-pay

## 2024-01-08 DIAGNOSIS — M5412 Radiculopathy, cervical region: Secondary | ICD-10-CM | POA: Diagnosis not present

## 2024-01-08 DIAGNOSIS — M48062 Spinal stenosis, lumbar region with neurogenic claudication: Secondary | ICD-10-CM | POA: Diagnosis not present

## 2024-01-10 NOTE — Progress Notes (Signed)
 01/11/24- 82 yoM for sleep evaluation for concern of OSA on CPAP, courtesy of Melissa O'Sullivan, NP for help with CPAP management. Medical problem list includes ASCVD/Aorta/ Carotid, HTN, Allergic Rhinitis, Complex Sleep Apnea, COPD, Nasal Septal Deviation, GERD Found CPAP titration/ compliance but not original diagnostic NPSG [employer]. ?around 2007/ Dohmeier? Before EMR. CPAP titration 04/25/2019 (Dr Dohmeier) -> recommended 14( auto 8-14), EPR 2 Epworth score-11 Body weight today-154 lbs He isn't sure why his PCP had referred him to us , since Dr Albertina Hugger had been managing, except he isn't clear if there were also some pulmonary issues including dypsnea on exertion, dry cough His son is here. He asks if he still needs CPAP after weight change. Complains of loose mask fit with leak after weight loss. He has chronic nasal congestion, apparently due to nasal trauma, and was receptive to suggestion that we refer him to ENT.  [name] AasDiscussed the use of AI scribe software for clinical note transcription with the patient, who gave verbal consent to proceed.  History of Present Illness   [name] Pratt "[name] Pratt" is an 83 year old male with sleep apnea who presents with difficulty using CPAP and concerns about oxygen levels at night. He was referred by Cheree Cords for evaluation of CPAP difficulties and potential lung issues.  [name] Pratt has a long-standing issue with sleep apnea, initially diagnosed in 2007 due to severe snoring. He has undergone multiple sleep studies and CPAP titration studies. He experiences discomfort and leakage with CPAP, particularly around the eyes and mouth, which he attributes to high pressure settings. Despite trying different mask sizes, he continues to have issues.  He expresses concern about his oxygen levels at night, especially after a hospital stay where he used a nasal cannula overnight due to the absence of his CPAP machine. He worries about not getting enough oxygen during  sleep.  He has a history of pneumonia in the upper lung, which was diagnosed during a hospital admission for dysphagia. He also reports exertional dyspnea, needing to catch his breath after climbing stairs.  [name] Pratt experiences nasal blockage and dryness, managed with over-the-counter products. He has a history of nasal surgery for a deviated septum, which initially improved his breathing but has since regressed. He experiences mucus production and sinus drainage, leading to a dry throat.  He has a history of smoking, which he quit at age 18. He mentions past pneumonia in January, which was reportedly clearing on the last chest x-ray.     Prior to Admission medications   Medication Sig Start Date End Date Taking? Authorizing Provider  acetaminophen  (TYLENOL ) 500 MG tablet Take 500 mg by mouth at bedtime as needed for moderate pain (pain score 4-6).   Yes [provider]  albuterol  (VENTOLIN  HFA) 108 (90 Base) MCG/ACT inhaler Inhale 2 puffs into the lungs every 6 (six) hours as needed for wheezing or shortness of breath. INHALE 2 PUFFS INTO THE LUNGS EVERY 6 HOURS AS NEEDED FOR SHORTNESS OF BREATH. 09/26/23  Yes O'Sullivan, Melissa, NP  allopurinol  (ZYLOPRIM ) 100 MG tablet Take 1 tablet (100 mg total) by mouth 2 (two) times daily. 11/15/23  Yes O'Sullivan, Melissa, NP  amLODipine  (NORVASC ) 2.5 MG tablet Take 1 tablet (2.5 mg total) by mouth 2 (two) times daily. 11/13/23  Yes Dorrene Gaucher, NP  antiseptic oral rinse (BIOTENE) LIQD 15 mLs by Mouth Rinse route 2 (two) times daily.   Yes [provider]  aspirin  EC 81 MG tablet Take 81 mg by mouth at bedtime.  Yes [provider]  bacitracin  ointment Apply 1 Application topically 2 (two) times daily. 09/15/22  Yes [name], [name], DO  cholestyramine  (QUESTRAN ) 4 GM/DOSE powder Take 4 g by mouth as needed (digestion). 30 ml   Yes [provider]  diclofenac  Sodium (VOLTAREN ) 1 % GEL Apply topically. 07/21/22  Yes [provider]  fluticasone  (FLONASE ) 50 MCG/ACT nasal spray Place 1 spray into both nostrils at bedtime.   Yes [provider]  gabapentin  (NEURONTIN ) 300 MG capsule Take 1 capsule (300 mg total) by mouth at bedtime. 07/17/23  Yes Dorrene Gaucher, NP  glucose blood test strip TRUE Metrix Blood Glucose Test Strip, use as instructed 11/12/20  Yes O'Sullivan, Melissa, NP  hydrALAZINE  (APRESOLINE ) 50 MG tablet Take 1 tablet (50 mg total) by mouth 3 (three) times daily. 10/10/23  Yes O'Sullivan, Melissa, NP  hydrochlorothiazide  (HYDRODIURIL ) 25 MG tablet Take 1 tablet (25 mg total) by mouth daily. 11/13/23  Yes Dorrene Gaucher, NP  Iron , Ferrous Sulfate , 325 (65 Fe) MG TABS Take 325 mg by mouth every other day. 08/10/22  Yes Dorrene Gaucher, NP  Lancets MISC 1 each by Does not apply route 2 (two) times daily. TRUE matrix lancets 11/12/20  Yes Dorrene Gaucher, NP  levothyroxine  (SYNTHROID ) 112 MCG tablet Take 1 tablet (112 mcg total) by mouth daily before breakfast. Take in addition to 50 mcg tablet 10/26/23  Yes O'Sullivan, Melissa, NP  loratadine  (CLARITIN ) 10 MG tablet Take 10 mg by mouth daily as needed for allergies or rhinitis.   Yes [provider]  Magnesium  500 MG TABS Take 500 mg by mouth daily.   Yes [provider]  Menthol , Topical Analgesic, (ICY HOT EX) Apply 1 Application topically at bedtime. Lidocaine  / on back   Yes [provider]  metroNIDAZOLE  (METROCREAM ) 0.75 % cream Apply 1 Application topically daily as needed (head).   Yes [provider]  multivitamin-lutein  (OCUVITE-LUTEIN ) CAPS capsule Take 1 capsule by mouth daily.   Yes [provider]  nitroGLYCERIN  (NITROSTAT ) 0.4 MG SL tablet Place 1 tablet (0.4 mg total) under the tongue every 5 (five) minutes as needed for chest pain. 09/26/23  Yes Dorrene Gaucher, NP  OVER THE COUNTER MEDICATION Place 1 spray into both nostrils 2 (two) times daily. Sinus saline   Yes  [provider]  OVER THE COUNTER MEDICATION Take 15 mLs by mouth at bedtime. Yellow Mustard   Yes [provider]  polyethylene glycol powder (GLYCOLAX /MIRALAX ) 17 GM/SCOOP powder Take 17 g by mouth 2 (two) times daily as needed for mild constipation, moderate constipation or severe constipation. 12/30/22  Yes Geiple, Joshua, PA-C  polyvinyl alcohol  (ARTIFICIAL TEARS) 1.4 % ophthalmic solution Place 1 drop into both eyes daily as needed for dry eyes.   Yes [provider]  PREBIOTIC PRODUCT PO Take 1 tablet by mouth daily.   Yes [provider]  rosuvastatin  (CRESTOR ) 5 MG tablet Take 1 tablet (5 mg total) by mouth daily. 07/17/23  Yes Dorrene Gaucher, NP  senna-docusate (SENOKOT-S) 8.6-50 MG tablet Take 2 tablets by mouth 2 (two) times daily. 09/17/23  Yes Krishnan, Gokul, MD  valsartan  (DIOVAN ) 320 MG tablet Take 1 tablet (320 mg total) by mouth daily. 10/24/23  Yes Dorrene Gaucher, NP  vitamin B-12 (CYANOCOBALAMIN ) 500 MCG tablet Take 500 mcg by mouth in the morning.   Yes [provider]  levothyroxine  (SYNTHROID ) 50 MCG tablet TAKE 1 TABLET BY MOUTH BEFORE BREAKFAST TAKE  WITH  ADDITIONAL  TO  112  MCG  TABLETS. 01/21/24   Dorrene Gaucher, NP   Past Medical History:  Diagnosis Date   Acute kidney injury (HCC) 05/02/2022   Acute metabolic encephalopathy 04/24/2022   Acute respiratory failure with hypoxia (HCC) 09/14/2023   Adenomatous polyp of ascending colon    Adenomatous polyp of colon    Allergic rhinitis 02/02/2016   Anemia 12/17/2009   Formatting of this note might be different from the original. 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 Hypertension   Benign paroxysmal positional vertigo 09/14/2021   Bilateral sacroiliitis (HCC) 07/22/2021   Body mass index (BMI) 25.0-25.9, adult 11/02/2020   Cancer (HCC)    skin cancer   Cardiac murmur 07/25/2021   Carotid stenosis    a. Carotid U/S 5/13: LICA < 50%, RICA 50-69%;  b.  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   Cystoid macular edema of both eyes 07/28/2020   Degenerative disc disease, lumbar 06/30/2015   Deviated nasal septum 02/18/2015   Diverticulosis 03/03/2020   DJD (degenerative joint disease) of hip 12/24/2012   Drowsiness 12/07/2022   Dry eyes 09/14/2023   Dyspnea 06/12/2011   Emphysema, unspecified (HCC) 03/05/2020   Erectile dysfunction 06/20/2017   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   Fall 09/18/2022   Gait disorder 03/02/2021   Gait instability 03/02/2021   GERD (gastroesophageal reflux disease)    GI bleed 04/21/2022   GOUT 05/13/2009   Qualifier: Diagnosis of  By: Nadean August     Greater trochanteric pain syndrome 07/22/2021   Hand pain, left 01/16/2022   Hand weakness 05/02/2022   Hearing loss 02/02/2016   Hematochezia    Heme positive stool 02/29/2020   Hemorrhagic shock (HCC)    HIATAL HERNIA 05/13/2009   Qualifier: Diagnosis of   By: Nadean August      Replacing diagnoses that were inactivated after the 12/11/22 regulatory import     High risk medication use 01/18/2012   Hip pain 03/02/2021   History of  chicken pox    History of COVID-19 12/05/2021   History of hemorrhoids    History of kidney stones    History of skin cancer 07/22/2021   skin cancer   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  Cardiology - Dr Dillard Frame at [facility] cardiology     ICD-10 cut over     Hyperlipidemia with target LDL less than 130 12/27/2010   Formatting of this note might be different from the original. Cardiology - Dr Dillard Frame  at [facility] cardiology  ICD-10 cut over   Hypokalemia 07/08/2021   Hypothyroidism    Internal hemorrhoids 03/03/2020   Iron  deficiency anemia 09/18/2022   Irregular heart beat 02/03/2022   Leg cramps 11/13/2019   Lower GI bleed 12/12/2012   Lumbago of lumbar region with sciatica 10/21/2020   Lumbar radiculopathy 03/02/2021   Lumbar spondylosis 11/02/2020   NEPHROLITHIASIS 05/13/2009   Qualifier: Diagnosis of  By: Nadean August     Nonspecific abnormal electrocardiogram (ECG) (EKG) 01/06/2013   Numbness of hand 01/24/2022   Onychomycosis 06/30/2015   OSA (obstructive sleep apnea) 11/25/2012   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 Osteoarthritis Of The Hand  10/1 IMO update   Osteoarthrosis, unspecified whether generalized or localized, pelvic region and thigh 07/25/2010   Formatting of this note might be different from the original. Osteoarthritis Of The Hip   Other abnormal glucose 07/22/2021   Other allergic rhinitis 02/18/2015   Palpitations 07/25/2021   Peripheral neuropathy 06/19/2016   Pneumonia of both lower lobes due to infectious organism 09/27/2023   Preop examination 12/06/2022   Preventative health care 11/24/2015   Right groin pain 11/29/2012   Right inguinal  hernia 05/13/2009   Qualifier: Diagnosis of  By: Nadean August     RLS (restless legs syndrome) 02/18/2015   Rosacea    Rotator cuff tear 07/27/2013   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   Sinus bradycardia 09/14/2023   Situational depression 03/02/2021   Skin lesion 01/16/2022   Skin tear of forearm without complication, left, subsequent encounter 09/18/2022   Sleep apnea with use of continuous positive airway pressure (CPAP) 07/15/2013   CPAP set to  7 cm water,  Residual AHi was 7.8  With no leak.  User time 6 hours.  479 days , not used only 12 days - highly compliant 06-23-13  .    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 Rochell Chroman   Ulnar neuropathy 01/24/2022   Urinary retention 11/26/2020   Vasovagal syncope    Ventral hernia 07/08/2012   Vertigo 07/08/2021   Vitamin D  deficiency 08/07/2022   Weakness of lower extremity 10/10/2022   Weight loss 03/03/2020   Past Surgical History:  Procedure Laterality Date   ABDOMINAL HERNIA REPAIR  07/15/12   Dr Theopolis Flack   ANTERIOR CERVICAL DECOMP/DISCECTOMY FUSION N/A 07/09/2020   Procedure: ANTERIOR CERVICAL DECOMPRESSION AND FUSION CERVICAL THREE-FOUR.;  Surgeon: Gearl Keens, MD;  Location: [facility] OR;  Service: Neurosurgery;  Laterality: N/A;  anterior   BROW LIFT  05/07/01   COLONOSCOPY WITH PROPOFOL  N/A 04/24/2022   Procedure: COLONOSCOPY WITH PROPOFOL ;  Surgeon: Lindle Rhea, MD;  Location: [facility] ENDOSCOPY;  Service: Gastroenterology;  Laterality: N/A;   COLONOSCOPY WITH PROPOFOL  N/A 04/26/2022   Procedure: COLONOSCOPY WITH PROPOFOL ;  Surgeon: Lindle Rhea, MD;  Location: [facility] ENDOSCOPY;  Service:  Gastroenterology;  Laterality: N/A;   CYSTOSCOPY WITH URETEROSCOPY AND STENT PLACEMENT Right 01/28/2014   Procedure: CYSTOSCOPY WITH URETEROSCOPY, BASKET RETRIVAL AND  STENT PLACEMENT;  Surgeon: Livingston Rigg, MD;  Location: [facility]  ORS;  Service: Urology;  Laterality: Right;   epidural injections     multiple procedures   ESOPHAGEAL DILATION  1993 and 1994   multiple times   EYE SURGERY Bilateral 03/25/10, 2012   cataract removal   HEMORRHOID SURGERY  2014   HIATAL HERNIA REPAIR  12/21/93   HOLMIUM LASER APPLICATION Right 01/28/2014   Procedure: HOLMIUM LASER APPLICATION;  Surgeon: Livingston Rigg, MD;  Location: [facility] ORS;  Service: Urology;  Laterality: Right;   INGUINAL HERNIA REPAIR Right 07/15/12   Dr Theopolis Flack, x2   JOINT REPLACEMENT  07/25/04   right knee   JOINT REPLACEMENT  07/13/10   left knee   KNEE SURGERY  1995   LAPAROSCOPIC CHOLECYSTECTOMY  09/20/06   with intraoperative cholangiogram and right inguinal herniorrhaphy with mesh    LIGAMENT REPAIR Left 07/2013   shoulder   LITHOTRIPSY     x2   LUMBAR LAMINECTOMY/DECOMPRESSION MICRODISCECTOMY Bilateral 12/27/2022   Procedure: Lumbar Two-Three, Lumbar Three-Four Sublaminar Decompression;  Surgeon: Gearl Keens, MD;  [facility] OR;  Service: Neurosurgery;  Laterality: Bilateral;   NASAL SEPTUM SURGERY  1975   POLYPECTOMY  04/26/2022   Procedure: POLYPECTOMY;  Surgeon: Lindle Rhea, MD;  Location: [facility] ENDOSCOPY;  Service: Gastroenterology;;   POSTERIOR CERVICAL FUSION/FORAMINOTOMY N/A 11/24/2020   Procedure: Posterior Cervical Laminectomy Cervical three-four, Cervical four-five with lateral mass fusion;  Surgeon: Gearl Keens, MD;  Location: [facility] OR;  Service: Neurosurgery;  Laterality: N/A;   REFRACTIVE SURGERY Bilateral    Right total hip replacement  4/14   SKIN CANCER DESTRUCTION     nose, ear   TONSILLECTOMY  as child   TOTAL HIP ARTHROPLASTY Left    URETHRAL DILATION  1991   Dr. 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   2 grandchildren   Has worked for [employer]   Enjoys wood working   Completed HS.  [employer]   Social Drivers of Corporate investment banker Strain: Low Risk  (10/28/2021)   Overall Financial Resource Strain (CARDIA)    Difficulty of Paying Living Expenses: Not very hard  Food Insecurity: No Food Insecurity (01/14/2024)   Hunger Vital Sign    Worried About Running Out  of Food in the Last Year: Never true    Ran Out of Food in the Last Year: Never true  Transportation Needs: No Transportation Needs (01/14/2024)   PRAPARE - Administrator, Civil Service (Medical): No    Lack of Transportation (Non-Medical): No  Physical Activity: Inactive (01/14/2024)   Exercise Vital Sign    Days of Exercise per Week: 0 days    Minutes of Exercise per Session: 0 min  Stress: Stress Concern Present (01/14/2024)   [employer] of Occupational Health - Occupational Stress Questionnaire    Feeling of  Stress : To some extent  Social Connections: Moderately Isolated (09/14/2023)   Social Connection and Isolation Panel [NHANES]    Frequency of Communication with Friends and Family: Three times a week    Frequency of Social Gatherings with Friends and Family: Three times a week    Attends Religious Services: Never    Active Member of Clubs or Organizations: No    Attends Banker Meetings: Never    Marital Status: Married  Catering manager Violence: Not At Risk (01/14/2024)   Humiliation, Afraid, Rape, and Kick questionnaire    Fear of Current or Ex-Partner: No    Emotionally Abused: No    Physically Abused: No    Sexually Abused: No   Assessment and Plan:    Obstructive Sleep Apnea Long-standing obstructive sleep apnea with CPAP intolerance due to mask fit and pressure discomfort. Recent CPAP download shows significant leak and breakthrough events, indicating suboptimal therapy. Potential chronic sinusitis may contribute to symptoms. - Order home sleep test without CPAP to assess current oxygen levels and sleep apnea severity. - Coordinate with home care company to refit CPAP mask for comfort. - Consider alternative treatments if CPAP is not tolerated or needed. - Provided information on Xylimelts for nighttime dryness.  Chronic Sinusitis Possible chronic sinusitis with nasal drainage, throat dryness, and mucus production. Nasal obstruction and previous nasal surgery may contribute to symptoms. - Evaluate for chronic sinusitis as part of the home sleep test assessment.  Nasal Obstruction Persistent nasal blockage and mouth breathing suggest ongoing obstruction despite previous surgery for deviated septum. - Assess nasal obstruction during home sleep test and consider ENT referral if significant obstruction is noted.  History of Pneumonia Pneumonia in January resolved on last chest x-ray.  Weight Loss Significant weight loss in 2020-2021 with subsequent weight  stabilization. No current concerns about weight loss.  Follow-up - Schedule follow-up appointment after home sleep test results are available. - Instruct him to call if no contact is made within two weeks after the home sleep test.        ROS-see HPI   + = positive Constitutional:    weight loss, night sweats, fevers, chills, fatigue, lassitude. HEENT:    headaches, +difficulty swallowing, +tooth/dental problems, sore throat,       +sneezing, itching, ear ache, +nasal congestion, post nasal drip, snoring CV:    chest pain, orthopnea, PND, +swelling in lower extremities, anasarca,                                   dizziness, +palpitations Resp:   +shortness of breath with exertion or at rest.                productive cough,   +non-productive cough, coughing up of blood.  change in color of mucus.  wheezing.   Skin:    rash or lesions. GI:  +heartburn, indigestion, abdominal pain, nausea, vomiting, diarrhea,                 change in bowel habits, loss of appetite GU: dysuria, change in color of urine, no urgency or frequency.   flank pain. MS:   +joint pain, stiffness, decreased range of motion, back pain. Neuro-     nothing unusual Psych:  change in mood or affect.  depression or +anxiety.   memory loss.  OBJ- Physical Exam General- Alert, Oriented, Affect-appropriate, Distress- none acute Skin- rash-none, lesions- none, excoriation- none Lymphadenopathy- none Head- atraumatic            Eyes- Gross vision intact, PERRLA, conjunctivae and secretions clear            Ears- Hearing, canals-normal            Nose- Clear, no-Septal dev, mucus, polyps, erosion, perforation             Throat- Mallampati II , mucosa clear , drainage- none, tonsils- atrophic Neck- flexible , trachea midline, no stridor , thyroid  nl, carotid no bruit Chest - symmetrical excursion , unlabored           Heart/CV- RRR , no murmur , no gallop  , no rub, nl s1 s2                           - JVD-  none , edema- none, stasis changes- none, varices- none           Lung- +diminished/clear to P&A, wheeze- none, cough- none , dullness-none, rub- none           Chest wall-  Abd-  Br/ Gen/ Rectal- Not done, not indicated Extrem- cyanosis- none, clubbing, none, atrophy- none, strength- nl Neuro- grossly intact to observation   History of Present Illness

## 2024-01-11 ENCOUNTER — Ambulatory Visit: Admitting: Internal Medicine

## 2024-01-11 ENCOUNTER — Encounter: Payer: Self-pay | Admitting: Internal Medicine

## 2024-01-11 VITALS — BP 136/64 | HR 66 | Temp 97.6°F | Ht 66.0 in | Wt 154.0 lb

## 2024-01-11 DIAGNOSIS — J3489 Other specified disorders of nose and nasal sinuses: Secondary | ICD-10-CM | POA: Diagnosis not present

## 2024-01-11 DIAGNOSIS — Z87891 Personal history of nicotine dependence: Secondary | ICD-10-CM

## 2024-01-11 DIAGNOSIS — G4733 Obstructive sleep apnea (adult) (pediatric): Secondary | ICD-10-CM

## 2024-01-11 NOTE — Patient Instructions (Addendum)
 Order- schedule home sleep test    dx OSA  Please call us  about 2 weeks after your sleep test for results and recommendations   If dry mouth bothers you- try otc Xylimelts- see if that helps  Order- referral to ENT, Dr Darlin Ehrlich    chronic nasal obstruction

## 2024-01-14 ENCOUNTER — Ambulatory Visit: Payer: Medicare HMO | Admitting: Licensed Clinical Social Worker

## 2024-01-14 NOTE — Patient Outreach (Signed)
 Complex Care Management   Visit Note  01/14/2024  Name:  [name] Pratt MRN: [id number] DOB: 1941/05/29  Situation: Referral received for Complex Care Management related to  client management of daily needs and client management of medical needs  I obtained verbal consent from Patient.  Visit completed with patient  on the phone  Background:   Past Medical History:  Diagnosis Date   Acute kidney injury (HCC) 05/02/2022   Acute metabolic encephalopathy 04/24/2022   Acute respiratory failure with hypoxia (HCC) 09/14/2023   Adenomatous polyp of ascending colon    Adenomatous polyp of colon    Allergic rhinitis 02/02/2016   Anemia 12/17/2009   Formatting of this note might be different from the original. 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 Hypertension   Benign paroxysmal positional vertigo 09/14/2021   Bilateral sacroiliitis (HCC) 07/22/2021   Body mass index (BMI) 25.0-25.9, adult 11/02/2020   Cancer (HCC)    skin cancer   Cardiac murmur 07/25/2021   Carotid stenosis    a. Carotid U/S 5/13: LICA < 50%, RICA 50-69%;  b.  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   Cystoid macular edema of both eyes 07/28/2020   Degenerative disc  disease, lumbar 06/30/2015   Deviated nasal septum 02/18/2015   Diverticulosis 03/03/2020   DJD (degenerative joint disease) of hip 12/24/2012   Drowsiness 12/07/2022   Dry eyes 09/14/2023   Dyspnea 06/12/2011   Emphysema, unspecified (HCC) 03/05/2020   Erectile dysfunction 06/20/2017   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   Fall 09/18/2022   Gait disorder 03/02/2021   Gait instability 03/02/2021   GERD (gastroesophageal reflux disease)    GI bleed 04/21/2022   GOUT 05/13/2009   Qualifier: Diagnosis of  By: Nadean August     Greater trochanteric pain syndrome 07/22/2021   Hand pain, left 01/16/2022   Hand weakness 05/02/2022   Hearing loss 02/02/2016   Hematochezia    Heme positive stool 02/29/2020   Hemorrhagic shock (HCC)    HIATAL HERNIA 05/13/2009   Qualifier: Diagnosis of   By: Nadean August      Replacing diagnoses that were inactivated after the 12/11/22 regulatory import     High risk medication use 01/18/2012   Hip pain 03/02/2021   History of chicken pox    History of COVID-19 12/05/2021   History of hemorrhoids    History of kidney stones    History of skin cancer 07/22/2021   skin cancer   History of total bilateral knee replacement 07/08/2020   HTN (hypertension) 05/13/2009            Hyperglycemia 03/03/2014   Hyperlipidemia 12/27/2010   Formatting of this note might be  different from the original.  Cardiology - Dr Dillard Frame at [facility] cardiology     ICD-10 cut over     Hyperlipidemia with target LDL less than 130 12/27/2010   Formatting of this note might be different from the original. Cardiology - Dr Dillard Frame at [facility] cardiology  ICD-10 cut over   Hypokalemia 07/08/2021   Hypothyroidism    Internal hemorrhoids 03/03/2020   Iron  deficiency anemia 09/18/2022   Irregular heart beat 02/03/2022   Leg cramps 11/13/2019   Lower GI bleed 12/12/2012   Lumbago of lumbar region with  sciatica 10/21/2020   Lumbar radiculopathy 03/02/2021   Lumbar spondylosis 11/02/2020   NEPHROLITHIASIS 05/13/2009   Qualifier: Diagnosis of  By: Nadean August     Nonspecific abnormal electrocardiogram (ECG) (EKG) 01/06/2013   Numbness of hand 01/24/2022   Onychomycosis 06/30/2015   OSA (obstructive sleep apnea) 11/25/2012   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 Osteoarthritis Of The Hand  10/1 IMO update   Osteoarthrosis, unspecified whether generalized or localized, pelvic region and thigh 07/25/2010   Formatting of this note might be different from the original. 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  User time 6 hours.  479 days , not used only 12 days - highly compliant 06-23-13  .    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 Rochell Chroman   Ulnar neuropathy 01/24/2022   Urinary retention 11/26/2020   Vasovagal syncope    Ventral hernia 07/08/2012   Vertigo 07/08/2021   Vitamin D  deficiency 08/07/2022   Weakness of lower extremity 10/10/2022   Weight loss 03/03/2020    Assessment: Patient Reported Symptoms:  Cognitive    Some difficulty with concentration    Neurological    RLS  HEENT    Hearing loss     Cardiovascular    HTN ; anemia   Respiratory    Dyspnea; COPD  Endocrine  Hypothyroidism    Gastrointestinal    GERD    Genitourinary    No issues mentioned  Integumentary    Unable to assess  Musculoskeletal    Fatigue; slow gait; uses walker or cane as needed to help him walk. May have to  take rest breaks. Pain issues faced; Gout; Fall risk      Psychosocial    Fatigue; mobility challenges; pain issues; Fall risk; some transport challenges. May need transport help in going to appointments longer distances away   Quality of Family Relationships: supportive      05/09/2023    9:38 AM  Depression screen PHQ 2/9  Decreased Interest 0  Down, Depressed, Hopeless 0  PHQ - 2 Score 0  Altered sleeping 0  Tired, decreased energy 0  Change in appetite 0  Feeling bad or failure about yourself  0  Trouble concentrating 0  Moving slowly or fidgety/restless 0  Suicidal thoughts 0  PHQ-9 Score 0  Difficult doing work/chores Not difficult at all    Vitals:   01/14/24 1318  BP: 126/60    Medications Reviewed Today     Reviewed by Suann Elms, LCSW (Social Worker) on 01/14/24 at 1321  Med List Status: <None>   Medication Order Taking? Sig Documenting Provider Last Dose Status Informant  acetaminophen  (TYLENOL ) 500 MG tablet [id number] No Take 500 mg by mouth at bedtime as needed for moderate pain (pain score 4-6). [provider] Taking Active Self,  Pharmacy Records  albuterol  (VENTOLIN  HFA) 108 [phone] Base) MCG/ACT inhaler [id number] No Inhale 2 puffs into the lungs every 6 (six) hours as needed for wheezing or shortness of breath. INHALE 2 PUFFS INTO THE LUNGS EVERY 6 HOURS AS NEEDED FOR SHORTNESS OF BREATH. Dorrene Gaucher, NP Taking Active   allopurinol  (ZYLOPRIM ) 100 MG tablet [id number] No Take 1 tablet (100 mg total) by mouth 2 (two) times daily. Dorrene Gaucher, NP Taking Active   amLODipine  (NORVASC ) 2.5 MG tablet [id number] No Take 1 tablet (2.5 mg total) by mouth 2 (two) times daily. Dorrene Gaucher, NP Taking Active   antiseptic oral rinse (BIOTENE) LIQD [id number] No 15 mLs by Mouth Rinse route 2 (two) times daily. [provider] Taking Active Self, Pharmacy Records  aspirin  EC 81 MG tablet [id number] No Take 81 mg by mouth at bedtime. [provider] Taking Active Self, Pharmacy Records           Med Note [facility] [place], [place] A   Fri Sep 14, 2023 12:02 PM)    bacitracin  ointment [id number] No Apply 1 Application topically 2 (two) times daily. Lowery Rue, DO Taking Active Self, Pharmacy Records  cholestyramine  (QUESTRAN ) 4 GM/DOSE powder [id number] No Take 4 g by mouth as needed (digestion). 30 ml [provider] Taking Active Self, Pharmacy Records  diclofenac  Sodium (VOLTAREN ) 1 % GEL [id number] No Apply topically. [provider] Taking Active   fluticasone  (FLONASE ) 50 MCG/ACT nasal spray [id number] No Place 1 spray into both nostrils at bedtime. [provider] Taking Active Self, Pharmacy Records  gabapentin  (NEURONTIN ) 300 MG capsule [id number] No Take 1 capsule (300 mg total) by mouth at bedtime. Dorrene Gaucher, NP Taking Active Self, Pharmacy Records  glucose blood test strip [id number] No TRUE Metrix Blood Glucose Test Strip, use as instructed Dorrene Gaucher, NP Taking Active Self, Pharmacy Records  hydrALAZINE  (APRESOLINE ) 50 MG tablet [id number] No Take 1 tablet (50  mg total) by mouth 3 (three) times daily. Dorrene Gaucher, NP Taking Active   hydrochlorothiazide  (HYDRODIURIL ) 25 MG tablet [id number] No Take 1 tablet (25 mg total) by mouth daily. Dorrene Gaucher, NP Taking Active   Iron , Ferrous Sulfate , 325 (65 Fe) MG TABS [id number] No Take 325 mg by mouth every other day. Dorrene Gaucher, NP Taking Active Self, Pharmacy  Records  Lancets MISC [id number] No 1 each by Does not apply route 2 (two) times daily. TRUE matrix lancets Dorrene Gaucher, NP Taking Active Self, Pharmacy Records  levothyroxine  (SYNTHROID ) 112 MCG tablet [id number] No Take 1 tablet (112 mcg total) by mouth daily before breakfast. Take in addition to 50 mcg tablet Dorrene Gaucher, NP Taking Active   levothyroxine  (SYNTHROID ) 50 MCG tablet [id number] No Take 1 tablet (50 mcg total) by mouth daily before breakfast. (Take in addition to 112 mcg tablet) Dorrene Gaucher, NP Taking Active   loratadine  (CLARITIN ) 10 MG tablet [id number] No Take 10 mg by mouth daily as needed for allergies or rhinitis. [provider] Taking Active Self, Pharmacy Records  Magnesium  500 MG TABS [id number] No Take 500 mg by mouth daily. [provider] Taking Active Self, Pharmacy Records  Menthol , Topical Analgesic, (ICY HOT EX) [id number] No Apply 1 Application topically at bedtime. Lidocaine  / on back [provider] Taking Active Self, Pharmacy Records  metroNIDAZOLE  (METROCREAM ) 0.75 % cream [id number] No Apply 1 Application topically daily as needed (head). [provider] Taking Active Self, Pharmacy Records  multivitamin-lutein  [facility] ) CAPS capsule [id number] No Take 1 capsule by mouth daily. [provider] Taking Active Self, Pharmacy Records  nitroGLYCERIN  (NITROSTAT ) 0.4 MG SL tablet [id number] No Place 1 tablet (0.4 mg total) under the tongue every 5 (five) minutes as needed for chest pain. Dorrene Gaucher, NP Taking Active   OVER THE  COUNTER MEDICATION [id number] No Place 1 spray into both nostrils 2 (two) times daily. Sinus saline [provider] Taking Active Self, Pharmacy Records  OVER THE COUNTER MEDICATION [id number] No Take 15 mLs by mouth at bedtime. Yellow Mustard [provider] Taking Active Self, Pharmacy Records  polyethylene glycol powder (GLYCOLAX /MIRALAX ) 17 GM/SCOOP powder [id number] No Take 17 g by mouth 2 (two) times daily as needed for mild constipation, moderate constipation or severe constipation. Lyna Sandhoff, PA-C Taking Active Self, Pharmacy Records  polyvinyl alcohol  (ARTIFICIAL TEARS) 1.4 % ophthalmic solution [id number] No Place 1 drop into both eyes daily as needed for dry eyes. [provider] Taking Active Self, Pharmacy Records  PREBIOTIC PRODUCT PO [id number] No Take 1 tablet by mouth daily. [provider] Taking Active Self, Pharmacy Records  rosuvastatin  (CRESTOR ) 5 MG tablet [id number] No Take 1 tablet (5 mg total) by mouth daily. Dorrene Gaucher, NP Taking Active Self, Pharmacy Records  senna-docusate (SENOKOT-S) 8.6-50 MG tablet [id number] No Take 2 tablets by mouth 2 (two) times daily. Maylene Spear, MD Taking Active   valsartan  (DIOVAN ) 320 MG tablet [id number] No Take 1 tablet (320 mg total) by mouth daily. Dorrene Gaucher, NP Taking Active   vitamin B-12 (CYANOCOBALAMIN ) 500 MCG tablet [id number] No Take 500 mcg by mouth in the morning. [provider] Taking Active Self, Pharmacy Records            Recommendation:   Client to attend all scheduled medical appointments Client to take medications as prescribed Client to be cautious in driving. Client has some difficulty in driving longer distances Client to attend appointments scheduled with dentist, ophthalmologist. Client to allow time for rest and self care (adequate sleep, eating meals as scheduled, allow time for hobbies and socialization with other  Follow Up Plan:   LCSW to  call client on 03/03/24 at 3:00 PM  Alexandria Angel  MSW, LCSW [place]/Value Based Care [facility] Licensed Clinical Social Worker Direct Dial:  [phone] Fax:  [phone] Website:  [email or web address]

## 2024-01-14 NOTE — Patient Instructions (Signed)
 Visit Information  Thank you for taking time to visit with me today. Please don't hesitate to contact me if I can be of assistance to you before our next scheduled appointment.  Our next appointment is by telephone on 03/03/24 at 2:30 PM   Please call the care guide team at [phone] if you need to cancel or reschedule your appointment.   Following is a copy of your care plan:   Goals Addressed             This Visit's Progress    VBCI Social Work Care Plan       Problems:   Pain issues             Transportation problems             Vision challenges             Concentration challenges            Takes time to complete ADLS             OSA              Hearing loss             Has 16 stairs in home; hard to navigate in home stairs  CSW Clinical Goal(s):   Over the next 30 days the Patient will attend all scheduled medical appointments as evidenced by patient report and care team review of appointment completion in electronic MEDICAL RECORD NUMBER  .             Over next 30 days client will work to manage medical needs faced AEB client  report of management of current medical needs faced by client  Interventions:  Client and LCSW spoke of client mobility issues. He has 16 steps in home and it is hard for him to navigate these steps. He plans to talk with [facility] representative about support in navigation of in home steps             Discussed medication procurement. Discussed sleeping issues of client.Discussed client upcoming medical appointments.             Discussed ambulation of client. Client uses walker or cane to help him walk            Discussed transport needs of client. He drives short distances            Discussed balance issues of client            Discussed vision of client            Discussed client support from spouse.            Discussed program support with RN, LCSW, Pharmacist.              Encouraged client to call LCSW for SW support as needed              Patient Goals/Self-Care Activities:  Client to attend scheduled medical appointments             Client to take medications as prescribed             Client to use walker or cane as needed to help him walk safely             Client to discuss medical issues faced with NP Melissa O Sullivan             Client to talk with [facility]  representative about in home mobility support for client related to stairs in client home  Plan:   LCSW gave client LCSW name and phone number and encouraged Pike to call LCSW as needed for SW support        Please go to [facility] [address], [place] [phone]) if you are experiencing a Mental Health or Behavioral Health Crisis or need someone to talk to.  The patient verbalized understanding of instructions, educational materials, and care plan provided today and DECLINED offer to receive copy of patient instructions, educational materials, and care plan.   Patient provided contact information to contact Care Team.  LCSW gave client LCSW name and phone number and encouraged client to call LCSW as needed for SW support   Alexandria Angel  MSW, LCSW [place]/Value Based Care [facility] Licensed Clinical Social Worker Direct Dial:  [phone] Fax:  [phone] Website:  [email or web address]

## 2024-01-19 ENCOUNTER — Other Ambulatory Visit: Payer: Self-pay | Admitting: Family

## 2024-01-19 DIAGNOSIS — E039 Hypothyroidism, unspecified: Secondary | ICD-10-CM

## 2024-01-21 ENCOUNTER — Ambulatory Visit: Payer: Self-pay | Admitting: *Deleted

## 2024-01-21 ENCOUNTER — Encounter: Payer: Self-pay | Admitting: Internal Medicine

## 2024-01-21 ENCOUNTER — Telehealth: Payer: Self-pay

## 2024-01-21 NOTE — Telephone Encounter (Signed)
  Chief Complaint: weakness and fatigue, questioning medications for BP Symptoms: feels weak and tired, falls asleep in chair reading "bills" and can't stay awake at times. Wears CPAP at night. Reports BP reading WNL but feeling worsening tiredness and dizziness at times. Drinking but does not try to drink too much due to more urination if drinking more.  Frequency: since last OV 12/12/23. Pertinent Negatives: Patient denies chest pain no difficulty breathing no fever reported. No weakness on either side of body reported.  Disposition: [] ED /[] Urgent Care (no appt availability in office) / [x] Appointment(In office/virtual)/ []  [place]/ [] Home Care/ [] Refused Recommended Disposition /[] [place] [facility]/ []  Follow-up with PCP Additional Notes:   Patient unable to come to OV tomorrow due to other appt. Scheduled appt with PCP 01/23/24.  Recommended if sx worsen call back or go to UC/ED.      Copied from CRM [id number]. Topic: Clinical - Red Word Triage >> Jan 21, 2024 11:59 AM Elita Guitar wrote: Red Word that prompted transfer to Nurse Triage: high bp meds causing him to feel weak and very tired. Reason for Disposition  [1] MODERATE weakness (i.e., interferes with work, school, normal activities) AND [2] persists > 3 days  Answer Assessment - Initial Assessment Questions 1. DESCRIPTION: "Describe how you are feeling."     Weak and tired feels like falling asleep all of the time  2. SEVERITY: "How bad is it?"  "Can you stand and walk?"   - MILD (0-3): Feels weak or tired, but does not interfere with work, school or normal activities.   - MODERATE (4-7): Able to stand and walk; weakness interferes with work, school, or normal activities.   - SEVERE (8-10): Unable to stand or walk; unable to do usual activities.     Wears CPAP at hs , weakness with standing and dizziness. Uses cane , walker to walk 3. ONSET: "When did these symptoms begin?" (e.g., hours, days, weeks, months)      Last OV  4. CAUSE: "What do you think is causing the weakness or fatigue?" (e.g., not drinking enough fluids, medical problem, trouble sleeping)     Not sure possible high BP medications 5. NEW MEDICINES:  "Have you started on any new medicines recently?" (e.g., opioid pain medicines, benzodiazepines, muscle relaxants, antidepressants, antihistamines, neuroleptics, beta blockers)     na 6. OTHER SYMPTOMS: "Do you have any other symptoms?" (e.g., chest pain, fever, cough, SOB, vomiting, diarrhea, bleeding, other areas of pain)     Feels tired all of the time 7. PREGNANCY: "Is there any chance you are pregnant?" "When was your last menstrual period?"     na  Protocols used: Weakness (Generalized) and Fatigue-A-AH

## 2024-01-21 NOTE — Telephone Encounter (Signed)
 Copied from CRM [id number]. Topic: Appointments - Scheduling Inquiry for Clinic >> Jan 18, 2024  1:25 PM [name] wrote: Reason for CRM: Patient saw Dr. Linder Revere 01/14/24 and was advised they will set patient up for at home sleep study. Patient states [facility]  has not heard from the office to approve for the at home sleep study. Patient wants to speak with someone on this matter, please call back at [phone]. >> Jan 21, 2024  8:01 AM Juliana Ocean wrote: Pt called this am and very frustrated that no one has called him concerning sleep study.  He would like a call back please.  Spoke with Felipa Horsfall per DPR. Pt has not been contacted regarding HST and is frustrated. Routing to [facility], please advise on scheduling.

## 2024-01-22 DIAGNOSIS — M5412 Radiculopathy, cervical region: Secondary | ICD-10-CM | POA: Diagnosis not present

## 2024-01-23 ENCOUNTER — Ambulatory Visit (INDEPENDENT_AMBULATORY_CARE_PROVIDER_SITE_OTHER): Admitting: Family

## 2024-01-23 VITALS — BP 126/68 | HR 67 | Temp 97.7°F | Resp 16 | Ht 66.0 in | Wt 156.0 lb

## 2024-01-23 DIAGNOSIS — R5383 Other fatigue: Secondary | ICD-10-CM | POA: Diagnosis not present

## 2024-01-23 DIAGNOSIS — G4730 Sleep apnea, unspecified: Secondary | ICD-10-CM | POA: Diagnosis not present

## 2024-01-23 DIAGNOSIS — J309 Allergic rhinitis, unspecified: Secondary | ICD-10-CM | POA: Diagnosis not present

## 2024-01-23 DIAGNOSIS — I10 Essential (primary) hypertension: Secondary | ICD-10-CM | POA: Diagnosis not present

## 2024-01-23 DIAGNOSIS — G959 Disease of spinal cord, unspecified: Secondary | ICD-10-CM | POA: Diagnosis not present

## 2024-01-23 NOTE — Progress Notes (Unsigned)
 Subjective:     Patient ID: [name] Pratt, male    DOB: [birth year], 83 y.o.   MRN: [id number]  Chief Complaint  Patient presents with   Fatigue    Patient complains of fatigue    HPI  Discussed the use of AI scribe software for clinical note transcription with the patient, who gave verbal consent to proceed.  History of Present Illness  [name] Pratt "[name] Pratt" is an 83 year old male with sleep apnea and chronic nasal issues who presents for follow-up on CPAP machine issues and nasal drainage.  He experiences ongoing issues with his CPAP machine, which is malfunctioning. He is scheduled for a home sleep study, using a machine sent to his house. He will sleep with his mask the first night, without it the second night, and choose on the third night. The machine must be returned within seven days after the study.  He has significant nasal drainage into his mouth, causing dryness and difficulty swallowing. He has undergone nasal surgery and continues to have difficulty breathing and persistent nasal drainage. He has been referred to an ear, nose, and throat specialist in [facility] for further evaluation by Dr. Linder Revere, his sleep specialist.  He experiences generalized weakness, particularly in his right leg, which he attributes to back issues. He recently received and ESI in his C spine for arm pain, which has provided temporary relief. He experiences fatigue, especially in the mornings, and feels weak when getting out of bed. His leg weakness affects his ability to drive safely at times so he has had friends bringing him to his appointments.    His blood pressure readings have been variable but reported generally at goal at home  BP reading this AM was 126/68.  He brought his home bp cuff with him and it aligns with the office reading here today. He is on multiple blood pressure medications, which he takes at night. He is concerned about the drowsiness caused by his medications,  leading to daytime sleepiness.  BP Readings from Last 3 Encounters:  01/23/24 126/68  01/14/24 126/60  01/11/24 136/64        Health Maintenance Due  Topic Date Due   Medicare Annual Wellness (AWV)  01/20/2023   COVID-19 Vaccine (6 - Pfizer risk 2024-25 season) 01/14/2024    Past Medical History:  Diagnosis Date   Acute kidney injury (HCC) 05/02/2022   Acute metabolic encephalopathy 04/24/2022   Acute respiratory failure with hypoxia (HCC) 09/14/2023   Adenomatous polyp of ascending colon    Adenomatous polyp of colon    Allergic rhinitis 02/02/2016   Anemia 12/17/2009   Formatting of this note might be different from the original. 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 Hypertension   Benign paroxysmal positional vertigo 09/14/2021   Bilateral sacroiliitis (HCC) 07/22/2021   Body mass index (BMI) 25.0-25.9, adult 11/02/2020   Cancer (HCC)    skin cancer   Cardiac murmur 07/25/2021   Carotid stenosis    a. Carotid U/S 5/13: LICA < 50%, RICA 50-69%;  b.  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   Cystoid macular edema of both eyes 07/28/2020   Degenerative disc disease, lumbar 06/30/2015   Deviated nasal septum 02/18/2015    Diverticulosis 03/03/2020   DJD (degenerative joint disease) of hip 12/24/2012   Drowsiness 12/07/2022   Dry eyes 09/14/2023   Dyspnea 06/12/2011   Emphysema, unspecified (HCC) 03/05/2020   Erectile dysfunction 06/20/2017   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   Fall 09/18/2022   Gait disorder 03/02/2021   Gait instability 03/02/2021   GERD (gastroesophageal reflux disease)    GI bleed 04/21/2022   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   Qualifier: Diagnosis of  By: Nadean August     Greater trochanteric pain syndrome 07/22/2021   Hand pain, left 01/16/2022   Hand weakness 05/02/2022   Hearing loss 02/02/2016   Hematochezia    Heme positive stool 02/29/2020   Hemorrhagic shock (HCC)    HIATAL HERNIA 05/13/2009   Qualifier: Diagnosis of   By: Nadean August      Replacing diagnoses that were inactivated after the 12/11/22 regulatory import     High risk medication use 01/18/2012   Hip pain 03/02/2021   History of chicken pox    History of COVID-19 12/05/2021   History of hemorrhoids    History of kidney stones    History of skin cancer 07/22/2021   skin cancer   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  Cardiology - Dr Dillard Frame at [facility] cardiology     ICD-10 cut over     Hyperlipidemia with target LDL less than 130 12/27/2010   Formatting of this note might be different from the original. Cardiology - Dr Dillard Frame at [facility] cardiology  ICD-10 cut over   Hypokalemia 07/08/2021   Hypothyroidism    Internal hemorrhoids 03/03/2020   Iron  deficiency anemia 09/18/2022   Irregular heart beat 02/03/2022   Leg cramps 11/13/2019   Lower GI bleed 12/12/2012   Lumbago of lumbar region with sciatica 10/21/2020   Lumbar radiculopathy 03/02/2021   Lumbar  spondylosis 11/02/2020   NEPHROLITHIASIS 05/13/2009   Qualifier: Diagnosis of  By: Nadean August     Nonspecific abnormal electrocardiogram (ECG) (EKG) 01/06/2013   Numbness of hand 01/24/2022   Onychomycosis 06/30/2015   OSA (obstructive sleep apnea) 11/25/2012   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 Osteoarthritis Of The Hand  10/1 IMO update   Osteoarthrosis, unspecified whether generalized or localized, pelvic region and thigh 07/25/2010   Formatting of this note might be different from the original. Osteoarthritis Of The Hip   Other abnormal glucose 07/22/2021   Other allergic rhinitis 02/18/2015   Palpitations 07/25/2021   Peripheral neuropathy 06/19/2016   Pneumonia of both lower lobes due to infectious organism 09/27/2023   Preop examination 12/06/2022   Preventative health care  11/24/2015   Right groin pain 11/29/2012   Right inguinal hernia 05/13/2009   Qualifier: Diagnosis of  By: Nadean August     RLS (restless legs syndrome) 02/18/2015   Rosacea    Rotator cuff tear 07/27/2013   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   Sinus bradycardia 09/14/2023   Situational depression 03/02/2021   Skin lesion 01/16/2022   Skin tear of forearm without complication, left, subsequent encounter 09/18/2022   Sleep apnea with use of continuous positive airway pressure (CPAP) 11/25/2012   CPAP set to  7 cm water,  Residual AHi was 7.8  With no leak.  User time 6 hours.  479 days , not used only 12 days - highly compliant 06-23-13  .    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 Rochell Chroman   Ulnar neuropathy 01/24/2022   Urinary retention 11/26/2020   Vasovagal syncope    Ventral hernia 07/08/2012   Vertigo 07/08/2021   Vitamin D  deficiency 08/07/2022   Weakness of lower extremity 10/10/2022   Weight loss 03/03/2020    Past Surgical History:  Procedure Laterality Date   ABDOMINAL HERNIA REPAIR  07/15/12   Dr Theopolis Flack   ANTERIOR CERVICAL DECOMP/DISCECTOMY FUSION N/A 07/09/2020   Procedure: ANTERIOR CERVICAL DECOMPRESSION AND FUSION CERVICAL THREE-FOUR.;  Surgeon: Gearl Keens, MD;  Location: [facility] OR;  Service: Neurosurgery;  Laterality: N/A;  anterior   BROW LIFT  05/07/01   COLONOSCOPY WITH PROPOFOL  N/A 04/24/2022   Procedure: COLONOSCOPY WITH PROPOFOL ;  Surgeon: Lindle Rhea, MD;  Location: [facility] ENDOSCOPY;  Service: Gastroenterology;  Laterality: N/A;   COLONOSCOPY WITH PROPOFOL  N/A 04/26/2022   Procedure: COLONOSCOPY WITH PROPOFOL ;  Surgeon: Lindle Rhea, MD;  Location: [facility] ENDOSCOPY;  Service: Gastroenterology;  Laterality: N/A;   CYSTOSCOPY WITH URETEROSCOPY AND STENT PLACEMENT Right 01/28/2014   Procedure: CYSTOSCOPY WITH URETEROSCOPY, BASKET RETRIVAL AND  STENT PLACEMENT;  Surgeon: Livingston Rigg, MD;  Location: [facility] ORS;  Service: Urology;  Laterality: Right;   epidural injections     multiple procedures   ESOPHAGEAL DILATION  1993 and 1994   multiple times   EYE SURGERY Bilateral 03/25/10, 2012   cataract removal   HEMORRHOID SURGERY  2014   HIATAL HERNIA REPAIR  12/21/93   HOLMIUM LASER APPLICATION Right 01/28/2014   Procedure: HOLMIUM LASER APPLICATION;  Surgeon: Livingston Rigg, MD;  Location: [facility] ORS;  Service: Urology;  Laterality: Right;   INGUINAL HERNIA REPAIR Right 07/15/12   Dr Theopolis Flack, x2   JOINT REPLACEMENT  07/25/04   right knee   JOINT REPLACEMENT  07/13/10   left knee   KNEE SURGERY  1995   LAPAROSCOPIC CHOLECYSTECTOMY  09/20/06   with intraoperative cholangiogram and right inguinal  herniorrhaphy with mesh    LIGAMENT REPAIR Left 07/2013   shoulder   LITHOTRIPSY     x2   LUMBAR LAMINECTOMY/DECOMPRESSION MICRODISCECTOMY Bilateral 12/27/2022   Procedure: Lumbar Two-Three, Lumbar Three-Four Sublaminar Decompression;  Surgeon: Gearl Keens, MD;  Location: [facility] OR;  Service: Neurosurgery;  Laterality: Bilateral;   NASAL SEPTUM SURGERY  1975   POLYPECTOMY  04/26/2022   Procedure: POLYPECTOMY;  Surgeon: Lindle Rhea, MD;  Location: [facility] ENDOSCOPY;  Service: Gastroenterology;;  POSTERIOR CERVICAL FUSION/FORAMINOTOMY N/A 11/24/2020   Procedure: Posterior Cervical Laminectomy Cervical three-four, Cervical four-five with lateral mass fusion;  Surgeon: Gearl Keens, MD;  Location: [facility] OR;  Service: Neurosurgery;  Laterality: N/A;   REFRACTIVE SURGERY Bilateral    Right total hip replacement  4/14   SKIN CANCER DESTRUCTION     nose, ear   TONSILLECTOMY  as child   TOTAL HIP ARTHROPLASTY Left    URETHRAL DILATION  1991   Dr. 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   2 grandchildren   Has worked for [employer]/r[employer]   Enjoys wood working   Completed HS.  [employer]   Social Drivers of Corporate investment banker Strain: Low Risk  (10/28/2021)   Overall Financial Resource Strain (CARDIA)    Difficulty of Paying Living Expenses: Not very hard  Food Insecurity: No Food Insecurity (01/14/2024)   Hunger Vital Sign    Worried About Running Out of Food in the Last Year: Never true    Ran Out of Food in the Last Year: Never true  Transportation Needs: No Transportation Needs (01/14/2024)   PRAPARE - Administrator, Civil Service (Medical): No    Lack of Transportation (Non-Medical): No  Physical Activity: Inactive (01/14/2024)   Exercise Vital Sign    Days of Exercise per Week: 0 days    Minutes of Exercise per Session: 0 min  Stress: Stress Concern Present (01/14/2024)   [employer] of Occupational Health - Occupational Stress Questionnaire    Feeling of Stress : To some extent  Social Connections: Moderately Isolated (09/14/2023)   Social Connection and Isolation Panel [NHANES]    Frequency of Communication with Friends and Family: Three times a week    Frequency of Social Gatherings with Friends and Family: Three times a week    Attends Religious Services: Never    Active Member of Clubs or Organizations: No    Attends Banker Meetings: Never    Marital Status: Married  Catering manager Violence: Not At Risk (01/14/2024)   Humiliation, Afraid, Rape, and Kick questionnaire    Fear of Current or Ex-Partner: No    Emotionally Abused: No  Physically Abused: No    Sexually Abused: No    Outpatient Medications Prior to Visit  Medication Sig Dispense Refill   acetaminophen  (TYLENOL ) 500 MG tablet Take 500 mg by mouth at bedtime as needed for moderate pain (pain score 4-6).     albuterol  (VENTOLIN  HFA) 108 (90 Base) MCG/ACT inhaler Inhale 2  puffs into the lungs every 6 (six) hours as needed for wheezing or shortness of breath. INHALE 2 PUFFS INTO THE LUNGS EVERY 6 HOURS AS NEEDED FOR SHORTNESS OF BREATH. 1 each 3   allopurinol  (ZYLOPRIM ) 100 MG tablet Take 1 tablet (100 mg total) by mouth 2 (two) times daily. 180 tablet 1   amLODipine  (NORVASC ) 2.5 MG tablet Take 1 tablet (2.5 mg total) by mouth 2 (two) times daily.     antiseptic oral rinse (BIOTENE) LIQD 15 mLs by Mouth Rinse route 2 (two) times daily.     aspirin  EC 81 MG tablet Take 81 mg by mouth at bedtime.     bacitracin  ointment Apply 1 Application topically 2 (two) times daily. 120 g 0   cholestyramine  (QUESTRAN ) 4 GM/DOSE powder Take 4 g by mouth as needed (digestion). 30 ml     diclofenac  Sodium (VOLTAREN ) 1 % GEL Apply topically.     fluticasone  (FLONASE ) 50 MCG/ACT nasal spray Place 1 spray into both nostrils at bedtime.     gabapentin  (NEURONTIN ) 300 MG capsule Take 1 capsule (300 mg total) by mouth at bedtime. 90 capsule 1   glucose blood test strip TRUE Metrix Blood Glucose Test Strip, use as instructed 100 each 12   hydrALAZINE  (APRESOLINE ) 50 MG tablet Take 1 tablet (50 mg total) by mouth 3 (three) times daily. 270 tablet 0   hydrochlorothiazide  (HYDRODIURIL ) 25 MG tablet Take 1 tablet (25 mg total) by mouth daily. 30 tablet 3   Iron , Ferrous Sulfate , 325 (65 Fe) MG TABS Take 325 mg by mouth every other day. 30 tablet 0   Lancets MISC 1 each by Does not apply route 2 (two) times daily. TRUE matrix lancets 100 each 3   levothyroxine  (SYNTHROID ) 112 MCG tablet Take 1 tablet (112 mcg total) by mouth daily before breakfast. Take in addition to 50 mcg tablet 90 tablet 0   levothyroxine  (SYNTHROID ) 50 MCG tablet TAKE 1 TABLET BY MOUTH BEFORE BREAKFAST TAKE  WITH  ADDITIONAL  TO  112  MCG  TABLETS. 90 tablet 0   loratadine  (CLARITIN ) 10 MG tablet Take 10 mg by mouth daily as needed for allergies or rhinitis.     Magnesium  500 MG TABS Take 500 mg by mouth daily.      Menthol , Topical Analgesic, (ICY HOT EX) Apply 1 Application topically at bedtime. Lidocaine  / on back     metroNIDAZOLE  (METROCREAM ) 0.75 % cream Apply 1 Application topically daily as needed (head).     multivitamin-lutein  (OCUVITE-LUTEIN ) CAPS capsule Take 1 capsule by mouth daily.     nitroGLYCERIN  (NITROSTAT ) 0.4 MG SL tablet Place 1 tablet (0.4 mg total) under the tongue every 5 (five) minutes as needed for chest pain. 25 tablet 6   OVER THE COUNTER MEDICATION Place 1 spray into both nostrils 2 (two) times daily. Sinus saline     OVER THE COUNTER MEDICATION Take 15 mLs by mouth at bedtime. Yellow Mustard     polyethylene glycol powder (GLYCOLAX /MIRALAX ) 17 GM/SCOOP powder Take 17 g by mouth 2 (two) times daily as needed for mild constipation, moderate constipation or severe constipation. 255 g 0  polyvinyl alcohol  (ARTIFICIAL TEARS) 1.4 % ophthalmic solution Place 1 drop into both eyes daily as needed for dry eyes.     PREBIOTIC PRODUCT PO Take 1 tablet by mouth daily.     rosuvastatin  (CRESTOR ) 5 MG tablet Take 1 tablet (5 mg total) by mouth daily. 90 tablet 1   senna-docusate (SENOKOT-S) 8.6-50 MG tablet Take 2 tablets by mouth 2 (two) times daily. 120 tablet 0   valsartan  (DIOVAN ) 320 MG tablet Take 1 tablet (320 mg total) by mouth daily. 90 tablet 1   vitamin B-12 (CYANOCOBALAMIN ) 500 MCG tablet Take 500 mcg by mouth in the morning.     No facility-administered medications prior to visit.    Allergies  Allergen Reactions   Pravastatin  Other (See Comments)    Muscle cramps   Sildenafil  Other (See Comments)    Tachycardia     Oxycodone  Other (See Comments)    Hallucinations    Atenolol Other (See Comments)    Bradycardia    Metoclopramide Hcl Anxiety    ROS See HPI    Objective:     Physical Exam Constitutional:      General: He is not in acute distress.    Appearance: He is well-developed.  HENT:     Head: Normocephalic and atraumatic.  Cardiovascular:      Rate and Rhythm: Normal rate and regular rhythm.     Heart sounds: No murmur heard. Pulmonary:     Effort: Pulmonary effort is normal. No respiratory distress.     Breath sounds: Normal breath sounds. No wheezing or rales.  Skin:    General: Skin is warm and dry.  Neurological:     Mental Status: He is alert.     Comments: Bilateral LE strength is mildly reduced but equal  Psychiatric:        Behavior: Behavior normal.        Thought Content: Thought content normal.      BP 126/68 Comment: home reading this am  Pulse 67   Temp 97.7 F (36.5 C) (Oral)   Resp 16   Ht 5\' 6"  (1.676 m)   Wt 156 lb (70.8 kg)   BMI 25.18 kg/m  Wt Readings from Last 3 Encounters:  01/23/24 156 lb (70.8 kg)  01/11/24 154 lb (69.9 kg)  12/12/23 153 lb (69.4 kg)       Assessment & Plan:   Problem List Items Addressed This Visit       Unprioritized   Sleep apnea with use of continuous positive airway pressure (CPAP)   He is being followed closely by his sleep specialist for this.       Fatigue - Primary   Likely multifactorial.  I don't see that he is on any medications that are potentially sedating. Currently his CPAP is not working well so this could be contributing factor.  Apparently he has a follow up home sleep study scheduled.       Cervical myelopathy (HCC)   He continues to have chronic lower extremity weakness as a result of his cervical myelopathy.  He uses a walker and is at baseline in this regard.        Benign essential hypertension   BP is always high in the clinic but was normal at home this am.  Suspect element white coat htn.  Continue diovan , hydrochlorothiazide , hydralazine .       Allergic rhinitis   Continue claritin .  Has upcoming ENT evaluation pending.  I am having [name] B. Delone "[name]" maintain his aspirin  EC, glucose blood, Lancets, Magnesium , vitamin B-12, loratadine , antiseptic oral rinse, polyvinyl alcohol , Iron  (Ferrous Sulfate ), bacitracin ,  PREBIOTIC PRODUCT PO, acetaminophen , fluticasone , (Menthol , Topical Analgesic, (ICY HOT EX)), metroNIDAZOLE , OVER THE COUNTER MEDICATION, cholestyramine , OVER THE COUNTER MEDICATION, multivitamin-lutein , polyethylene glycol powder, gabapentin , rosuvastatin , senna-docusate, albuterol , nitroGLYCERIN , hydrALAZINE , valsartan , levothyroxine , hydrochlorothiazide , amLODipine , allopurinol , diclofenac  Sodium, and levothyroxine .  No orders of the defined types were placed in this encounter.

## 2024-01-24 ENCOUNTER — Other Ambulatory Visit: Payer: Self-pay | Admitting: Family

## 2024-01-24 DIAGNOSIS — E039 Hypothyroidism, unspecified: Secondary | ICD-10-CM

## 2024-01-24 DIAGNOSIS — R5383 Other fatigue: Secondary | ICD-10-CM | POA: Insufficient documentation

## 2024-01-24 NOTE — Telephone Encounter (Signed)
 Copied from CRM [id number]. Topic: Clinical - Medication Refill >> Jan 24, 2024 12:34 PM Magdalene School wrote: Medication: levothyroxine  (SYNTHROID ) 112 MCG tablet  Has the patient contacted their pharmacy? Yes (Agent: If no, request that the patient contact the pharmacy for the refill. If patient does not wish to contact the pharmacy document the reason why and proceed with request.) (Agent: If yes, when and what did the pharmacy advise?)  This is the patient's preferred pharmacy:  [id number] - [place], [place] - [address] [place] [address] Phone: [phone] Fax: [phone]   Is this the correct pharmacy for this prescription? Yes If no, delete pharmacy and type the correct one.   Has the prescription been filled recently? No  Is the patient out of the medication? Yes  Has the patient been seen for an appointment in the last year OR does the patient have an upcoming appointment? Yes  Can we respond through MyChart? No  Agent: Please be advised that Rx refills may take up to 3 business days. We ask that you follow-up with your pharmacy.

## 2024-01-24 NOTE — Assessment & Plan Note (Signed)
 He continues to have chronic lower extremity weakness as a result of his cervical myelopathy.  He uses a walker and is at baseline in this regard.

## 2024-01-24 NOTE — Patient Instructions (Signed)
 VISIT SUMMARY:  Today, we discussed your ongoing issues with your CPAP machine, nasal drainage, leg weakness, and blood pressure. We have planned a home sleep study to assess your CPAP needs and referred you to an ENT specialist for your nasal issues. We also addressed your leg weakness and back pain, and provided guidance on monitoring your blood pressure.  YOUR PLAN:  SLEEP APNEA: Your CPAP machine is malfunctioning, and we need to assess if you still need it. -Complete the home sleep study as planned. Use the machine with your mask the first night, without it the second night, and choose on the third night. -Return the sleep study equipment within 7 days for analysis.  DRY MOUTH AND NASAL DRAINAGE: You have persistent nasal drainage and dry mouth, possibly due to nasal obstruction.Please continue claritin .   -Attend your ENT appointment next month for further evaluation and potential treatment.  LEG WEAKNESS AND BACK PAIN: You have chronic leg weakness likely related to previous back issues and nerve involvement. -Proceed with your scheduled back injections next week.  ARM PAIN AND WEAKNESS: You have intermittent arm pain and weakness, likely related to cervical spine issues. -Consider a follow-up with orthopedics if your arm pain persists.  HYPERTENSION: Your blood pressure readings have been variable, but current management seems adequate at home. -Monitor your blood pressure at home. -Contact us  if your blood pressure readings are consistently above 160/100 or below 100/60.

## 2024-01-24 NOTE — Assessment & Plan Note (Signed)
 Likely multifactorial.  I don't see that he is on any medications that are potentially sedating. Currently his CPAP is not working well so this could be contributing factor.  Apparently he has a follow up home sleep study scheduled.

## 2024-01-24 NOTE — Assessment & Plan Note (Signed)
 Continue claritin .  Has upcoming ENT evaluation pending.

## 2024-01-24 NOTE — Telephone Encounter (Signed)
 Per chart review, prescription was received by the pharmacy today.

## 2024-01-24 NOTE — Assessment & Plan Note (Signed)
 He is being followed closely by his sleep specialist for this.

## 2024-01-24 NOTE — Assessment & Plan Note (Signed)
 BP is always high in the clinic but was normal at home this am.  Suspect element white coat htn.  Continue diovan , hydrochlorothiazide , hydralazine .

## 2024-01-25 ENCOUNTER — Other Ambulatory Visit: Payer: Self-pay | Admitting: Family

## 2024-01-25 ENCOUNTER — Telehealth: Payer: Self-pay

## 2024-01-25 DIAGNOSIS — E039 Hypothyroidism, unspecified: Secondary | ICD-10-CM

## 2024-01-25 NOTE — Telephone Encounter (Signed)
 Copied from CRM [id number]. Topic: Clinical - Medication Question >> Jan 25, 2024 10:39 AM Adonis Hoot wrote: Reason for CRM: Patient called in stating that he would like to have all of his medication prescriptions going to [facility] [place] [facility]  Phone: [phone] Fax: [phone] Going forward.I advised him that when he is in need of a refill we can send the new pharmacy along with the request to provider.

## 2024-01-28 ENCOUNTER — Encounter

## 2024-01-28 DIAGNOSIS — G4730 Sleep apnea, unspecified: Secondary | ICD-10-CM | POA: Diagnosis not present

## 2024-01-28 DIAGNOSIS — G4733 Obstructive sleep apnea (adult) (pediatric): Secondary | ICD-10-CM

## 2024-01-29 ENCOUNTER — Telehealth: Payer: Self-pay | Admitting: *Deleted

## 2024-01-29 NOTE — Telephone Encounter (Signed)
 Copied from CRM [id number]. Topic: Clinical - Medication Question >> Jan 28, 2024 11:32 AM Hilton Lucky wrote: Reason for CRM: Patient states is currently doing a sleep study and has a question. He would like to be called back and advised.  Called the pt and there was no answer- LMTCB.

## 2024-01-29 NOTE — Telephone Encounter (Signed)
 Copied from CRM [id number]. Topic: Clinical - Medical Advice >> Jan 29, 2024  3:34 PM [name] wrote: Reason for CRM: Patient states did not use finger monitor the a few night ago during sleep study test. Patient states last night he was able to use the finger monitor. Patient is asking if that interfer with the results for the sleep study test? Patient sent out this morning the sleep study machine and paperwork. Please advise and call back [phone].  I called and spoke with the pt  He states that he completed his sleep study at home using [facility]  He is worried that the quality of the test will not be good, since the first night the monitor fell off his finger  He called [facility] and they told him it should be okay, but he wanted Dr Linder Revere to know  Routing to him as FYI

## 2024-01-29 NOTE — Telephone Encounter (Signed)
 Duplicate

## 2024-02-01 ENCOUNTER — Emergency Department (HOSPITAL_BASED_OUTPATIENT_CLINIC_OR_DEPARTMENT_OTHER)
Admission: EM | Admit: 2024-02-01 | Discharge: 2024-02-01 | Disposition: A | Discharge status: Home / Self Care | Attending: Emergency Medicine | Admitting: Emergency Medicine

## 2024-02-01 ENCOUNTER — Emergency Department (HOSPITAL_BASED_OUTPATIENT_CLINIC_OR_DEPARTMENT_OTHER)

## 2024-02-01 ENCOUNTER — Encounter (HOSPITAL_BASED_OUTPATIENT_CLINIC_OR_DEPARTMENT_OTHER): Payer: Self-pay

## 2024-02-01 ENCOUNTER — Other Ambulatory Visit: Payer: Self-pay

## 2024-02-01 DIAGNOSIS — Z79899 Other long term (current) drug therapy: Secondary | ICD-10-CM | POA: Insufficient documentation

## 2024-02-01 DIAGNOSIS — I10 Essential (primary) hypertension: Secondary | ICD-10-CM | POA: Diagnosis not present

## 2024-02-01 DIAGNOSIS — M62838 Other muscle spasm: Secondary | ICD-10-CM | POA: Insufficient documentation

## 2024-02-01 DIAGNOSIS — M62831 Muscle spasm of calf: Secondary | ICD-10-CM | POA: Diagnosis not present

## 2024-02-01 DIAGNOSIS — Z7982 Long term (current) use of aspirin: Secondary | ICD-10-CM | POA: Insufficient documentation

## 2024-02-01 DIAGNOSIS — M79671 Pain in right foot: Secondary | ICD-10-CM | POA: Diagnosis not present

## 2024-02-01 DIAGNOSIS — W228XXA Striking against or struck by other objects, initial encounter: Secondary | ICD-10-CM | POA: Insufficient documentation

## 2024-02-01 DIAGNOSIS — M85871 Other specified disorders of bone density and structure, right ankle and foot: Secondary | ICD-10-CM | POA: Diagnosis not present

## 2024-02-01 LAB — BASIC METABOLIC PANEL WITH GFR
Anion gap: 16 — ABNORMAL HIGH (ref 5–15)
BUN: 36 mg/dL — ABNORMAL HIGH (ref 8–23)
CO2: 23 mmol/L (ref 22–32)
Calcium: 9.9 mg/dL (ref 8.9–10.3)
Chloride: 102 mmol/L (ref 98–111)
Creatinine, Ser: 1.33 mg/dL — ABNORMAL HIGH (ref 0.61–1.24)
GFR, Estimated: 53 mL/min — ABNORMAL LOW (ref >60)
Glucose, Bld: 102 mg/dL — ABNORMAL HIGH (ref 70–99)
Potassium: 3.7 mmol/L (ref 3.5–5.1)
Sodium: 140 mmol/L (ref 135–145)

## 2024-02-01 LAB — CBC
HCT: 35.6 % — ABNORMAL LOW (ref 39.0–52.0)
Hemoglobin: 11.7 g/dL — ABNORMAL LOW (ref 13.0–17.0)
MCH: 32 pg (ref 26.0–34.0)
MCHC: 32.9 g/dL (ref 30.0–36.0)
MCV: 97.3 fL (ref 80.0–100.0)
Platelets: 341 10*3/uL (ref 150–400)
RBC: 3.66 MIL/uL — ABNORMAL LOW (ref 4.22–5.81)
RDW: 14.2 % (ref 11.5–15.5)
WBC: 9.3 10*3/uL (ref 4.0–10.5)
nRBC: 0 % (ref 0.0–0.2)

## 2024-02-01 LAB — MAGNESIUM: Magnesium: 2 mg/dL (ref 1.7–2.4)

## 2024-02-01 MED ORDER — ACETAMINOPHEN 500 MG PO TABS
1000.0000 mg | ORAL_TABLET | Freq: Once | ORAL | Status: DC
  Filled 2024-02-01: qty 2

## 2024-02-01 MED ORDER — CYCLOBENZAPRINE HCL 5 MG PO TABS
5.0000 mg | ORAL_TABLET | Freq: Once | ORAL | Status: AC
  Administered 2024-02-01: 5 mg via ORAL
  Filled 2024-02-01: qty 1

## 2024-02-01 NOTE — ED Provider Notes (Signed)
 [facility] [place] Provider Note   CSN: [id number] Arrival date & time: 02/01/24  1109     History  Chief Complaint  Patient presents with   Foot Injury    [name] Pratt is a 83 y.o. male.  83 year old male with right foot pain, muscle spasms.  Patient states that 3 days ago he was sitting on the edge of his bed when he hit his right foot on the side board of the bed, he has been able to ambulate on his own without difficulty however his pain persists.  He also complains of intermittent cramping in his right buttocks with extension down the posterior aspect of his right thigh, this does not seem to be related to the pain in his right foot.  He denies fevers, chest pain, shortness of breath.  History of gout, he is on allopurinol .   Foot Injury      Home Medications Prior to Admission medications   Medication Sig Start Date End Date Taking? Authorizing Provider  acetaminophen  (TYLENOL ) 500 MG tablet Take 500 mg by mouth at bedtime as needed for moderate pain (pain score 4-6).    [provider]  albuterol  (VENTOLIN  HFA) 108 (90 Base) MCG/ACT inhaler Inhale 2 puffs into the lungs every 6 (six) hours as needed for wheezing or shortness of breath. INHALE 2 PUFFS INTO THE LUNGS EVERY 6 HOURS AS NEEDED FOR SHORTNESS OF BREATH. 09/26/23   Dorrene Gaucher, NP  allopurinol  (ZYLOPRIM ) 100 MG tablet Take 1 tablet (100 mg total) by mouth 2 (two) times daily. 11/15/23   O'Sullivan, Melissa, NP  amLODipine  (NORVASC ) 2.5 MG tablet Take 1 tablet (2.5 mg total) by mouth 2 (two) times daily. 11/13/23   O'Sullivan, Melissa, NP  antiseptic oral rinse (BIOTENE) LIQD 15 mLs by Mouth Rinse route 2 (two) times daily.    [provider]  aspirin  EC 81 MG tablet Take 81 mg by mouth at bedtime.    [provider]  bacitracin  ointment Apply 1 Application topically 2 (two) times daily. 09/15/22   [name], [name], DO  cholestyramine  (QUESTRAN )  4 GM/DOSE powder Take 4 g by mouth as needed (digestion). 30 ml    [provider]  diclofenac  Sodium (VOLTAREN ) 1 % GEL Apply topically. 07/21/22   [provider]  fluticasone  (FLONASE ) 50 MCG/ACT nasal spray Place 1 spray into both nostrils at bedtime.    [provider]  gabapentin  (NEURONTIN ) 300 MG capsule Take 1 capsule (300 mg total) by mouth at bedtime. 07/17/23   Dorrene Gaucher, NP  glucose blood test strip TRUE Metrix Blood Glucose Test Strip, use as instructed 11/12/20   O'Sullivan, Melissa, NP  hydrALAZINE  (APRESOLINE ) 50 MG tablet Take 1 tablet (50 mg total) by mouth 3 (three) times daily. 10/10/23   O'Sullivan, Melissa, NP  hydrochlorothiazide  (HYDRODIURIL ) 25 MG tablet Take 1 tablet (25 mg total) by mouth daily. 11/13/23   O'Sullivan, Melissa, NP  Iron , Ferrous Sulfate , 325 (65 Fe) MG TABS Take 325 mg by mouth every other day. 08/10/22   O'Sullivan, Melissa, NP  Lancets MISC 1 each by Does not apply route 2 (two) times daily. TRUE matrix lancets 11/12/20   Dorrene Gaucher, NP  levothyroxine  (SYNTHROID ) 112 MCG tablet TAKE 1 TABLET BY MOUTH ONCE DAILY BEFORE BREAKFAST TAKE  WITH  ADDITIONAL  TO  50  MCG  TABLETS 01/24/24   Dorrene Gaucher, NP  levothyroxine  (SYNTHROID ) 50 MCG tablet TAKE 1 TABLET BY MOUTH BEFORE BREAKFAST TAKE  WITH  ADDITIONAL  TO  112  MCG  TABLETS. 01/21/24   Dorrene Gaucher, NP  loratadine  (CLARITIN ) 10 MG tablet Take 10 mg by mouth daily as needed for allergies or rhinitis.    [provider]  Magnesium  500 MG TABS Take 500 mg by mouth daily.    [provider]  Menthol , Topical Analgesic, (ICY HOT EX) Apply 1 Application topically at bedtime. Lidocaine  / on back    [provider]  metroNIDAZOLE  (METROCREAM ) 0.75 % cream Apply 1 Application topically daily as needed (head).    [provider]  multivitamin-lutein  (OCUVITE-LUTEIN ) CAPS capsule Take 1 capsule by mouth daily.    [provider]  nitroGLYCERIN  (NITROSTAT ) 0.4 MG SL tablet Place 1 tablet (0.4 mg total) under the tongue every 5 (five) minutes as needed for chest pain. 09/26/23   Dorrene Gaucher, NP  OVER THE COUNTER MEDICATION Place 1 spray into both nostrils 2 (two) times daily. Sinus saline    [provider]  OVER THE COUNTER MEDICATION Take 15 mLs by mouth at bedtime. Yellow Mustard    [provider]  polyethylene glycol powder (GLYCOLAX /MIRALAX ) 17 GM/SCOOP powder Take 17 g by mouth 2 (two) times daily as needed for mild constipation, moderate constipation or severe constipation. 12/30/22   Geiple, Joshua, PA-C  polyvinyl alcohol  (ARTIFICIAL TEARS) 1.4 % ophthalmic solution Place 1 drop into both eyes daily as needed for dry eyes.    [provider]  PREBIOTIC PRODUCT PO Take 1 tablet by mouth daily.    [provider]  rosuvastatin  (CRESTOR ) 5 MG tablet Take 1 tablet (5 mg total) by mouth daily. 07/17/23   O'Sullivan, Melissa, NP  senna-docusate (SENOKOT-S) 8.6-50 MG tablet Take 2 tablets by mouth 2 (two) times daily. 09/17/23   Krishnan, Gokul, MD  valsartan  (DIOVAN ) 320 MG tablet Take 1 tablet (320 mg total) by mouth daily. 10/24/23   O'Sullivan, Melissa, NP  vitamin B-12 (CYANOCOBALAMIN ) 500 MCG tablet Take 500 mcg by mouth in the morning.    [provider]      Allergies    Pravastatin , Sildenafil , Oxycodone , Atenolol, and Metoclopramide hcl    Review of Systems   Review of Systems  Physical Exam Updated Vital Signs BP (!) 142/64   Pulse 65   Temp 97.7 F (36.5 C) (Oral)   Resp 18   Wt 71.2 kg   SpO2 99%   BMI 25.34 kg/m  Physical Exam Vitals and nursing note reviewed.  HENT:     Head: Normocephalic.  Eyes:     Extraocular Movements: Extraocular movements intact.  Cardiovascular:     Rate and Rhythm: Normal rate.  Pulmonary:     Effort: Pulmonary effort is normal.  Musculoskeletal:     Cervical back: Normal range of motion.      Comments: RLE: Mild tenderness to palpation of dorsal/medial aspect of mid-foot, trace erythema but no warmth/swelling. Plantar/dorsiflexion of the toes in-tact but elicits pain. ROM of the ankle/knee are in-tact.   Midline vertical scar anterior aspect of both knees  Neurological:     Mental Status: He is alert.    ED Results / Procedures / Treatments   Labs (all labs ordered are listed, but only abnormal results are displayed) Labs Reviewed  CBC - Abnormal; Notable for the following components:      Result Value   RBC 3.66 (*)    Hemoglobin 11.7 (*)    HCT 35.6 (*)    All other components  within normal limits  BASIC METABOLIC PANEL WITH GFR - Abnormal; Notable for the following components:   Glucose, Bld 102 (*)    BUN 36 (*)    Creatinine, Ser 1.33 (*)    GFR, Estimated 53 (*)    Anion gap 16 (*)    All other components within normal limits  MAGNESIUM     EKG None  Radiology DG Foot Complete Right Result Date: 02/01/2024 CLINICAL DATA:  Right foot pain EXAM: RIGHT FOOT COMPLETE - 3+ VIEW COMPARISON:  September 25, 2019 FINDINGS: Diffuse osteopenia. No acute, displaced fracture or dislocation. There is no evidence of arthropathy or other focal bone abnormality. Extensive peripheral vascular atherosclerosis. No radiopaque foreign body. IMPRESSION: Diffuse osteopenia. No acute, displaced fracture or dislocation. If concern persists for a nondisplaced fracture in the setting of osteopenia, a follow-up noncontrast CT of the foot could be considered. Electronically Signed   By: Rance Burrows M.D.   On: 02/01/2024 15:13    Procedures Procedures    Medications Ordered in ED Medications  acetaminophen  (TYLENOL ) tablet 1,000 mg (has no administration in time range)  cyclobenzaprine  (FLEXERIL ) tablet 5 mg (5 mg Oral Given 02/01/24 1313)    ED Course/ Medical Decision Making/ A&P                                 Medical Decision Making This patient presents to the ED for concern  of R foot pain/muscle spasms, this involves an extensive number of treatment options, and is a complaint that carries with it a high risk of complications and morbidity.  The differential diagnosis includes fracture, contusion, strain/sprain, muscle cramps, electrolyte disturbance.   Co morbidities that complicate the patient evaluation  Dyslipidemia, hypertension, osteopenia   Lab Tests:  I Ordered, and personally interpreted labs.  The pertinent results include:  CBC notable for anemia with hemoglobin of 11.7, however this is similar as compared to patient's recent lab results.  BMP notable for creatinine of 1.33, however this improved as compared to patient's baseline.  BMP notable for slight elevation in anion gap of 16.  Magnesium  within normal limits.     Imaging Studies ordered:  I ordered imaging studies including XR R foot  I independently visualized and interpreted imaging which showed Diffuse osteopenia. No acute, displaced fracture or dislocation. If concern persists for a nondisplaced fracture in the setting of osteopenia, a follow-up noncontrast CT of the foot could be considered.  I agree with the radiologist interpretation   Cardiac Monitoring: / EKG:  The patient was maintained on a cardiac monitor.  I personally viewed and interpreted the cardiac monitored which showed an underlying rhythm of: normal sinus rhythm   Problem List / ED Course / Critical interventions / Medication management   I ordered medication including Flexeril   for muscle spasms, Tylenol  for pain  Reevaluation of the patient after these medicines showed that the patient stayed the same I have reviewed the patients home medicines and have made adjustments as needed   Social Determinants of Health:  Former tobacco use, physical inactivity, stress   Test / Admission - Considered:  During my physical exam patient experiences 3 episodes of "muscle cramping" in his buttocks with radiation down  his right thigh, this seems to be a separate issue and unrelated to the pain in his right foot.  Will check basic labs including CBC, BMP, magnesium  given patient is experiencing frequent muscle cramping, see  above for lab interpretations.  Imaging of right foot is negative for acute fracture/dislocation.  Given patient's age and high risk for falls, I did not feel it was appropriate to discharge patient with course of Robaxin /Flexeril .  Advised patient to follow-up with his primary care provider in regard to the muscle cramping, he voiced understanding and is in agreement with this plan.  Continue Tylenol  as needed for pain, I recommend stretching of the right lower extremity to reduce instances of muscle cramping.  Return precautions discussed.     Amount and/or Complexity of Data Reviewed Labs: ordered. Radiology: ordered.  Risk OTC drugs. Prescription drug management.           Final Clinical Impression(s) / ED Diagnoses Final diagnoses:  Right foot pain  Muscle spasms of lower extremity    Rx / DC Orders ED Discharge Orders     None         Adolm Ahumada 02/01/24 1629    Nicklas Barns, MD 02/02/24 [phone]

## 2024-02-01 NOTE — Discharge Instructions (Addendum)
 Follow-up with your primary care provider next week.  Continue Tylenol  as needed for pain. For stretching: sit in your chair and elevate your leg off the ground, flex your toes upward toward the ceiling and downward toward the ground, repeat this several times. This exercise will help stretch your hamstrings and hopefully reduce muscle cramping.

## 2024-02-01 NOTE — ED Triage Notes (Signed)
 Pt presents with complaints of right foot pain x 3 days. Reports right foot slid off bed onto foot board twice. Pt feels pain radiating from back

## 2024-02-01 NOTE — ED Notes (Addendum)
 Reviewed discharge instructions, medications and follow up with pt. Pt and family states understanding. Pt assisted to vehicle via transport chair. Pain improved

## 2024-02-05 ENCOUNTER — Ambulatory Visit: Payer: Self-pay

## 2024-02-05 ENCOUNTER — Encounter: Payer: Self-pay | Admitting: Family

## 2024-02-05 ENCOUNTER — Ambulatory Visit (INDEPENDENT_AMBULATORY_CARE_PROVIDER_SITE_OTHER): Admitting: Family

## 2024-02-05 ENCOUNTER — Other Ambulatory Visit (HOSPITAL_BASED_OUTPATIENT_CLINIC_OR_DEPARTMENT_OTHER): Payer: Self-pay

## 2024-02-05 VITALS — BP 130/68 | HR 74 | Ht 66.0 in | Wt 155.6 lb

## 2024-02-05 DIAGNOSIS — M8580 Other specified disorders of bone density and structure, unspecified site: Secondary | ICD-10-CM | POA: Diagnosis not present

## 2024-02-05 DIAGNOSIS — W19XXXA Unspecified fall, initial encounter: Secondary | ICD-10-CM | POA: Diagnosis not present

## 2024-02-05 DIAGNOSIS — R252 Cramp and spasm: Secondary | ICD-10-CM | POA: Diagnosis not present

## 2024-02-05 DIAGNOSIS — H353211 Exudative age-related macular degeneration, right eye, with active choroidal neovascularization: Secondary | ICD-10-CM | POA: Diagnosis not present

## 2024-02-05 DIAGNOSIS — J449 Chronic obstructive pulmonary disease, unspecified: Secondary | ICD-10-CM

## 2024-02-05 DIAGNOSIS — E1169 Type 2 diabetes mellitus with other specified complication: Secondary | ICD-10-CM

## 2024-02-05 DIAGNOSIS — N1832 Chronic kidney disease, stage 3b: Secondary | ICD-10-CM | POA: Insufficient documentation

## 2024-02-05 DIAGNOSIS — E785 Hyperlipidemia, unspecified: Secondary | ICD-10-CM | POA: Diagnosis not present

## 2024-02-05 NOTE — Assessment & Plan Note (Signed)
He is followed by ophthalmology.  

## 2024-02-05 NOTE — Telephone Encounter (Signed)
 Copied from CRM [id number]. Topic: General - Other >> Feb 05, 2024  9:35 AM Bambi Bonine D wrote: Reason for CRM: Pt stated that he went into the ER on Saturday because he hit his foot and thought it was injured. Pt stated that the ER wanted him to follow up with the PCP. Pt stated that the pain level has went down since Saturday and he also wants to inform Melissa, NP, that he is having cramps in his right leg up to his back. Pt stated that he would like to receive a call back today if possible.

## 2024-02-05 NOTE — Assessment & Plan Note (Signed)
 He continues to use his rolling walker and has done extensive PT.

## 2024-02-05 NOTE — Assessment & Plan Note (Signed)
Will obtain ABI for further evaluation.

## 2024-02-05 NOTE — Assessment & Plan Note (Signed)
 Last GFR 53- monitor.

## 2024-02-05 NOTE — Assessment & Plan Note (Signed)
 Lab Results  Component Value Date   CHOL 125 01/22/2023   HDL 44.70 01/22/2023   LDLCALC 55 01/22/2023   TRIG 125.0 01/22/2023   CHOLHDL 3 01/22/2023   Maintained on statin- continue same. Plan to update lipid panel next visit.

## 2024-02-05 NOTE — Assessment & Plan Note (Signed)
Clinically stable. Monitor. 

## 2024-02-05 NOTE — Progress Notes (Signed)
 Subjective:     Patient ID: [name] Pratt, male    DOB: 1941-02-03, 83 y.o.   MRN: [id number]  Chief Complaint  Patient presents with   Acute Visit    Patient presents today for right foot pain. He was seen in the emergency room in 02/01/24.    HPI  Discussed the use of AI scribe software for clinical note transcription with the patient, who gave verbal consent to proceed.  History of Present Illness  [name] Pratt "[name] Pratt" is an 83 year old male who presents for ER follow up.  He presented to the ER on 5/23 following a fall on 5/20 due to right sided foot pain.  He has experienced recent falls, including one on Jan 29, 2024, after hitting his right foot on the sideboard of his bed. Another fall occurred at home where he landed on his coccyx and his head slid down the dishwasher without severe injury. His wife assisted him in getting up. He experienced diarrhea during recent fall, which contributed to the incident.   He experiences severe leg cramps starting from his toes and radiating to his thigh, primarily at night. The cramps are intense, causing him to yell for help, and his legs feel cold during these episodes. He naps frequently during the day, often falling asleep in his office chair. He reports good compliance with CPAP machine use.   Health Maintenance Due  Topic Date Due   Medicare Annual Wellness (AWV)  01/20/2023   COVID-19 Vaccine (6 - Pfizer risk 2024-25 season) 01/14/2024    Past Medical History:  Diagnosis Date   Acute kidney injury (HCC) 05/02/2022   Acute metabolic encephalopathy 04/24/2022   Acute respiratory failure with hypoxia (HCC) 09/14/2023   Adenomatous polyp of ascending colon    Adenomatous polyp of colon    Allergic rhinitis 02/02/2016   Anemia 12/17/2009   Formatting of this note might be different from the original. Anemia  10/1 IMO update   Aortic atherosclerosis (HCC) 03/05/2020   Arthritis    Aspiration pneumonia (HCC) 09/15/2023    Asymmetric SNHL (sensorineural hearing loss) 02/29/2016   Atypical chest pain 11/24/2015   Benign essential hypertension 05/13/2009   Qualifier: Diagnosis of  By: Glendora Landsman of this note might be different from the original. Hypertension   Benign paroxysmal positional vertigo 09/14/2021   Bilateral sacroiliitis (HCC) 07/22/2021   Body mass index (BMI) 25.0-25.9, adult 11/02/2020   Cancer (HCC)    skin cancer   Cardiac murmur 07/25/2021   Carotid stenosis    a. Carotid U/S 5/13: LICA < 50%, RICA 50-69%;  b.  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   Cystoid macular edema of both eyes 07/28/2020   Degenerative disc disease, lumbar 06/30/2015   Deviated nasal septum 02/18/2015   Diverticulosis 03/03/2020   DJD (degenerative joint disease) of hip 12/24/2012   Drowsiness 12/07/2022   Dry eyes 09/14/2023   Dyspnea 06/12/2011   Emphysema, unspecified (HCC) 03/05/2020   Erectile dysfunction 06/20/2017   Exudative age-related macular degeneration  of right eye with active choroidal neovascularization (HCC) 02/09/2021   Fall 09/18/2022   Gait disorder 03/02/2021   Gait instability 03/02/2021   GERD (gastroesophageal reflux disease)    GI bleed 04/21/2022   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   Qualifier: Diagnosis of  By: Nadean August     Greater trochanteric pain syndrome 07/22/2021   Hand pain, left 01/16/2022   Hand weakness 05/02/2022   Hearing loss 02/02/2016   Hematochezia    Heme positive stool 02/29/2020   Hemorrhagic shock (HCC)    HIATAL HERNIA  05/13/2009   Qualifier: Diagnosis of   By: Nadean August      Replacing diagnoses that were inactivated after the 12/11/22 regulatory import     High risk medication use 01/18/2012   Hip pain 03/02/2021   History of chicken pox    History of COVID-19 12/05/2021   History of hemorrhoids    History of kidney stones    History of skin cancer 07/22/2021   skin cancer   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  Cardiology - Dr Dillard Frame at [facility] cardiology     ICD-10 cut over     Hyperlipidemia with target LDL less than 130 12/27/2010   Formatting of this note might be different from the original. Cardiology - Dr Dillard Frame at [facility] cardiology  ICD-10 cut over   Hypokalemia 07/08/2021   Hypothyroidism    Internal hemorrhoids 03/03/2020   Iron  deficiency anemia 09/18/2022   Irregular heart beat 02/03/2022   Leg cramps 11/13/2019   Lower GI bleed 12/12/2012   Lumbago of lumbar region with sciatica 10/21/2020   Lumbar radiculopathy 03/02/2021   Lumbar spondylosis 11/02/2020   NEPHROLITHIASIS 05/13/2009   Qualifier: Diagnosis of  By: Nadean August     Nonspecific abnormal electrocardiogram (ECG) (EKG) 01/06/2013   Numbness of hand 01/24/2022   Onychomycosis 06/30/2015   OSA (obstructive sleep apnea) 11/25/2012   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 Osteoarthritis Of The Hand  10/1 IMO update   Osteoarthrosis, unspecified whether generalized or localized, pelvic region and thigh 07/25/2010   Formatting of this note might be different from the original. Osteoarthritis Of The Hip   Other abnormal glucose 07/22/2021   Other allergic rhinitis 02/18/2015   Palpitations 07/25/2021    Peripheral neuropathy 06/19/2016   Pneumonia of both lower lobes due to infectious organism 09/27/2023   Preop examination 12/06/2022   Preventative health care 11/24/2015   Right groin pain 11/29/2012   Right inguinal hernia 05/13/2009   Qualifier: Diagnosis of  By: Nadean August     RLS (restless legs syndrome) 02/18/2015   Rosacea    Rotator cuff tear 07/27/2013   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   Sinus bradycardia 09/14/2023   Situational depression 03/02/2021   Skin lesion 01/16/2022   Skin tear of forearm without complication, left, subsequent encounter 09/18/2022   Sleep apnea with use of continuous positive airway pressure (CPAP) 11/25/2012   CPAP set to  7 cm water,  Residual AHi was 7.8  With no leak.  User time 6 hours.  479 days , not used only 12 days - highly compliant 06-23-13  .  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 Rochell Chroman   Ulnar neuropathy 01/24/2022   Urinary retention 11/26/2020   Vasovagal syncope    Ventral hernia 07/08/2012   Vertigo 07/08/2021   Vitamin D  deficiency 08/07/2022   Weakness of lower extremity 10/10/2022   Weight loss 03/03/2020    Past Surgical History:  Procedure Laterality Date   ABDOMINAL HERNIA REPAIR  07/15/12   Dr Theopolis Flack   ANTERIOR CERVICAL DECOMP/DISCECTOMY FUSION N/A 07/09/2020   Procedure: ANTERIOR CERVICAL DECOMPRESSION AND FUSION CERVICAL THREE-FOUR.;  Surgeon: Gearl Keens, MD;  Location: [facility] OR;  Service: Neurosurgery;  Laterality: N/A;  anterior   BROW LIFT  05/07/01   COLONOSCOPY WITH PROPOFOL  N/A 04/24/2022   Procedure: COLONOSCOPY WITH PROPOFOL ;  Surgeon: Lindle Rhea, MD;   Location: [facility] ENDOSCOPY;  Service: Gastroenterology;  Laterality: N/A;   COLONOSCOPY WITH PROPOFOL  N/A 04/26/2022   Procedure: COLONOSCOPY WITH PROPOFOL ;  Surgeon: Lindle Rhea, MD;  Location[name] ENDOSCOPY;  Service: Gastroenterology;  Laterality: N/A;   CYSTOSCOPY WITH URETEROSCOPY AND STENT PLACEMENT Right 01/28/2014   Procedure: CYSTOSCOPY WITH URETEROSCOPY, BASKET RETRIVAL AND  STENT PLACEMENT;  Surgeon: Livingston Rigg, MD;  Location: [facility] ORS;  Service: Urology;  Laterality: Right;   epidural injections     multiple procedures   ESOPHAGEAL DILATION  1993 and 1994   multiple times   EYE SURGERY Bilateral 03/25/10, 2012   cataract removal   HEMORRHOID SURGERY  2014   HIATAL HERNIA REPAIR  12/21/93   HOLMIUM LASER APPLICATION Right 01/28/2014   Procedure: HOLMIUM LASER APPLICATION;  Surgeon: Livingston Rigg, MD;  Location: [facility] ORS;  Service: Urology;  Laterality: Right;   INGUINAL HERNIA REPAIR Right 07/15/12   Dr Theopolis Flack, x2   JOINT REPLACEMENT  07/25/04   right knee   JOINT REPLACEMENT  07/13/10   left knee   KNEE SURGERY  1995   LAPAROSCOPIC CHOLECYSTECTOMY  09/20/06   with intraoperative cholangiogram and right inguinal herniorrhaphy with mesh    LIGAMENT REPAIR Left 07/2013   shoulder   LITHOTRIPSY     x2   LUMBAR LAMINECTOMY/DECOMPRESSION MICRODISCECTOMY Bilateral 12/27/2022   Procedure: Lumbar Two-Three, Lumbar Three-Four Sublaminar Decompression;  Surgeon: Gearl Keens, MD;  Location: [facility] OR;  Service: Neurosurgery;  Laterality: Bilateral;   NASAL SEPTUM SURGERY  1975   POLYPECTOMY  04/26/2022   Procedure: POLYPECTOMY;  Surgeon: Lindle Rhea, MD;  Location: [facility] ENDOSCOPY;  Service: Gastroenterology;;   POSTERIOR CERVICAL FUSION/FORAMINOTOMY N/A 11/24/2020   Procedure: Posterior Cervical Laminectomy Cervical three-four, Cervical four-five with lateral mass fusion;  Surgeon: Gearl Keens, MD;  Location: [facility] OR;  Service: Neurosurgery;  Laterality: N/A;   REFRACTIVE SURGERY Bilateral     Right total hip replacement  4/14   SKIN CANCER DESTRUCTION     nose, ear   TONSILLECTOMY  as child   TOTAL HIP ARTHROPLASTY Left    URETHRAL DILATION  1991   Dr. Inga Manges    Family History  Problem Relation Age of Onset   Cancer Mother    Hypertension Other    Cancer Other    Osteoarthritis Other    Stomach cancer Neg Hx    Colon cancer Neg Hx  Esophageal cancer Neg Hx     Social History   Socioeconomic History   Marital status: Married    Spouse name: Felipa Horsfall   Number of children: 1   Years of education: Not on file   Highest education level: Not on file  Occupational History   Occupation: firefighter    Comment: paints on the side   Occupation: retired  Tobacco Use   Smoking status: Former    Current packs/day: 0.00    Types: Cigarettes    Start date: 09/12/1967    Quit date: 09/11/1978    Years since quitting: 45.4   Smokeless tobacco: Never  Vaping Use   Vaping status: Never Used  Substance and Sexual Activity   Alcohol  use: Not Currently    Alcohol /week: 0.0 standard drinks of alcohol     Comment: rare   Drug use: No   Sexual activity: Not Currently  Other Topics Concern   Not on file  Social History Narrative   Lives with his wife   He has one son- lives locally.   2 grandchildren   Has worked for [employer]arden/r[employer]   Enjoys wood working   Completed HS.  [employer]   Social Drivers of Corporate investment banker Strain: Low Risk  (10/28/2021)   Overall Financial Resource Strain (CARDIA)    Difficulty of Paying Living Expenses: Not very hard  Food Insecurity: No Food Insecurity (01/14/2024)   Hunger Vital Sign    Worried About Running Out of Food in the Last Year: Never true    Ran Out of Food in the Last Year: Never true  Transportation Needs: No Transportation Needs (01/14/2024)   PRAPARE - Administrator, Civil Service (Medical): No    Lack of Transportation (Non-Medical): No  Physical Activity: Inactive (01/14/2024)   Exercise Vital Sign     Days of Exercise per Week: 0 days    Minutes of Exercise per Session: 0 min  Stress: Stress Concern Present (01/14/2024)   [employer] of Occupational Health - Occupational Stress Questionnaire    Feeling of Stress : To some extent  Social Connections: Moderately Isolated (09/14/2023)   Social Connection and Isolation Panel [NHANES]    Frequency of Communication with Friends and Family: Three times a week    Frequency of Social Gatherings with Friends and Family: Three times a week    Attends Religious Services: Never    Active Member of Clubs or Organizations: No    Attends Banker Meetings: Never    Marital Status: Married  Catering manager Violence: Not At Risk (01/14/2024)   Humiliation, Afraid, Rape, and Kick questionnaire    Fear of Current or Ex-Partner: No    Emotionally Abused: No    Physically Abused: No    Sexually Abused: No    Outpatient Medications Prior to Visit  Medication Sig Dispense Refill   acetaminophen  (TYLENOL ) 500 MG tablet Take 500 mg by mouth at bedtime as needed for moderate pain (pain score 4-6).     albuterol  (VENTOLIN  HFA) 108 (90 Base) MCG/ACT inhaler Inhale 2 puffs into the lungs every 6 (six) hours as needed for wheezing or shortness of breath. INHALE 2 PUFFS INTO THE LUNGS EVERY 6 HOURS AS NEEDED FOR SHORTNESS OF BREATH. 1 each 3   allopurinol  (ZYLOPRIM ) 100 MG tablet Take 1 tablet (100 mg total) by mouth 2 (two) times daily. 180 tablet 1   amLODipine  (NORVASC ) 2.5 MG tablet Take 1 tablet (2.5 mg total) by  mouth 2 (two) times daily.     antiseptic oral rinse (BIOTENE) LIQD 15 mLs by Mouth Rinse route 2 (two) times daily.     aspirin  EC 81 MG tablet Take 81 mg by mouth at bedtime.     bacitracin  ointment Apply 1 Application topically 2 (two) times daily. 120 g 0   cholestyramine  (QUESTRAN ) 4 GM/DOSE powder Take 4 g by mouth as needed (digestion). 30 ml     diclofenac  Sodium (VOLTAREN ) 1 % GEL Apply topically.     fluticasone  (FLONASE )  50 MCG/ACT nasal spray Place 1 spray into both nostrils at bedtime.     gabapentin  (NEURONTIN ) 300 MG capsule Take 1 capsule (300 mg total) by mouth at bedtime. 90 capsule 1   glucose blood test strip TRUE Metrix Blood Glucose Test Strip, use as instructed 100 each 12   hydrALAZINE  (APRESOLINE ) 50 MG tablet Take 1 tablet (50 mg total) by mouth 3 (three) times daily. 270 tablet 0   hydrochlorothiazide  (HYDRODIURIL ) 25 MG tablet Take 1 tablet (25 mg total) by mouth daily. 30 tablet 3   Iron , Ferrous Sulfate , 325 (65 Fe) MG TABS Take 325 mg by mouth every other day. 30 tablet 0   Lancets MISC 1 each by Does not apply route 2 (two) times daily. TRUE matrix lancets 100 each 3   levothyroxine  (SYNTHROID ) 112 MCG tablet TAKE 1 TABLET BY MOUTH ONCE DAILY BEFORE BREAKFAST TAKE  WITH  ADDITIONAL  TO  50  MCG  TABLETS 90 tablet 0   levothyroxine  (SYNTHROID ) 50 MCG tablet TAKE 1 TABLET BY MOUTH BEFORE BREAKFAST TAKE  WITH  ADDITIONAL  TO  112  MCG  TABLETS. 90 tablet 0   loratadine  (CLARITIN ) 10 MG tablet Take 10 mg by mouth daily as needed for allergies or rhinitis.     Magnesium  500 MG TABS Take 500 mg by mouth daily.     Menthol , Topical Analgesic, (ICY HOT EX) Apply 1 Application topically at bedtime. Lidocaine  / on back     metroNIDAZOLE  (METROCREAM ) 0.75 % cream Apply 1 Application topically daily as needed (head).     multivitamin-lutein  (OCUVITE-LUTEIN ) CAPS capsule Take 1 capsule by mouth daily.     nitroGLYCERIN  (NITROSTAT ) 0.4 MG SL tablet Place 1 tablet (0.4 mg total) under the tongue every 5 (five) minutes as needed for chest pain. 25 tablet 6   OVER THE COUNTER MEDICATION Place 1 spray into both nostrils 2 (two) times daily. Sinus saline     OVER THE COUNTER MEDICATION Take 15 mLs by mouth at bedtime. Yellow Mustard     polyethylene glycol powder (GLYCOLAX /MIRALAX ) 17 GM/SCOOP powder Take 17 g by mouth 2 (two) times daily as needed for mild constipation, moderate constipation or severe  constipation. 255 g 0   polyvinyl alcohol  (ARTIFICIAL TEARS) 1.4 % ophthalmic solution Place 1 drop into both eyes daily as needed for dry eyes.     PREBIOTIC PRODUCT PO Take 1 tablet by mouth daily.     rosuvastatin  (CRESTOR ) 5 MG tablet Take 1 tablet (5 mg total) by mouth daily. 90 tablet 1   senna-docusate (SENOKOT-S) 8.6-50 MG tablet Take 2 tablets by mouth 2 (two) times daily. 120 tablet 0   valsartan  (DIOVAN ) 320 MG tablet Take 1 tablet (320 mg total) by mouth daily. 90 tablet 1   vitamin B-12 (CYANOCOBALAMIN ) 500 MCG tablet Take 500 mcg by mouth in the morning.     No facility-administered medications prior to visit.    Allergies  Allergen  Reactions   Pravastatin  Other (See Comments)    Muscle cramps   Sildenafil  Other (See Comments)    Tachycardia     Oxycodone  Other (See Comments)    Hallucinations    Atenolol Other (See Comments)    Bradycardia    Metoclopramide Hcl Anxiety    ROS    See HPI Objective:     Physical Exam Constitutional:      General: He is not in acute distress.    Appearance: He is well-developed.  HENT:     Head: Normocephalic and atraumatic.     Comments: Occipital superficial abrasion Cardiovascular:     Rate and Rhythm: Normal rate and regular rhythm.     Pulses:          Dorsalis pedis pulses are 2+ on the right side and 2+ on the left side.       Posterior tibial pulses are 1+ on the right side and 1+ on the left side.     Heart sounds: No murmur heard. Pulmonary:     Effort: Pulmonary effort is normal. No respiratory distress.     Breath sounds: Normal breath sounds. No wheezing or rales.  Skin:    General: Skin is warm and dry.  Neurological:     Mental Status: He is alert and oriented to person, place, and time.  Psychiatric:        Behavior: Behavior normal.        Thought Content: Thought content normal.      BP 130/68   Pulse 74   Ht 5\' 6"  (1.676 m)   Wt 155 lb 9.6 oz (70.6 kg)   SpO2 99%   BMI 25.11 kg/m  Wt  Readings from Last 3 Encounters:  02/05/24 155 lb 9.6 oz (70.6 kg)  02/01/24 157 lb (71.2 kg)  01/23/24 156 lb (70.8 kg)       Assessment & Plan:   Problem List Items Addressed This Visit       Unprioritized   Stage 3b chronic kidney disease (HCC)   Last GFR 53- monitor.      Osteopenia - Primary   Relevant Orders   DG Bone Density   Leg cramping   Will obtain ABI for further evaluation.        Relevant Orders   VAS US  ABI WITH/WO TBI   Hyperlipidemia associated with type 2 diabetes mellitus (HCC)   Lab Results  Component Value Date   CHOL 125 01/22/2023   HDL 44.70 01/22/2023   LDLCALC 55 01/22/2023   TRIG 125.0 01/22/2023   CHOLHDL 3 01/22/2023   Maintained on statin- continue same. Plan to update lipid panel next visit.       Fall   He continues to use his rolling walker and has done extensive PT.      Exudative age-related macular degeneration of right eye with active choroidal neovascularization [facility])   He is followed by ophthalmology.       COPD (chronic obstructive pulmonary disease) (HCC)   Clinically stable.  Monitor.       I am having Jhon B. Soliman "[name]" maintain his aspirin  EC, glucose blood, Lancets, Magnesium , vitamin B-12, loratadine , antiseptic oral rinse, polyvinyl alcohol , Iron  (Ferrous Sulfate ), bacitracin , PREBIOTIC PRODUCT PO, acetaminophen , fluticasone , (Menthol , Topical Analgesic, (ICY HOT EX)), metroNIDAZOLE , OVER THE COUNTER MEDICATION, cholestyramine , OVER THE COUNTER MEDICATION, multivitamin-lutein , polyethylene glycol powder, gabapentin , rosuvastatin , senna-docusate, albuterol , nitroGLYCERIN , hydrALAZINE , valsartan , hydrochlorothiazide , amLODipine , allopurinol , diclofenac  Sodium, levothyroxine , and levothyroxine .  No orders of  the defined types were placed in this encounter.

## 2024-02-05 NOTE — Patient Instructions (Signed)
 VISIT SUMMARY:  During your visit, we discussed your leg cramps, recent falls, sleep apnea, and mild anemia. We have planned some tests to better understand your symptoms and improve your treatment.  YOUR PLAN:  LEG CRAMPS: You have been experiencing severe leg cramps, especially at night, which may be due to circulatory issues. -We will order blood flow studies to assess your circulation.  OSTEOPENIA: Your X-ray showed bone thinning, which increases your risk of fractures. -We will order a bone density test to assess your bone strength.  MILD ANEMIA: Your blood tests show mild anemia, which is not a new finding. -We will continue to monitor your anemia.

## 2024-02-05 NOTE — Telephone Encounter (Signed)
  Chief Complaint: Foot/leg pain Symptoms: foot and leg pain, ankle swelling Frequency: ongoing for a few days Pertinent Negatives: Patient denies n/a Disposition: [] ED /[] Urgent Care (no appt availability in office) / [x] Appointment(In office/virtual)/ []  [facility]/ [] Home Care/ [] Refused Recommended Disposition /[] [facility]/ []  Follow-up with PCP Additional Notes: Patient states he woke up a few days ago and hit his foot onto the bed rail and had foot pain that increased in intensity so he went to ER. Patient states they did an X Ray and blood work and he was told to have PCP review these in his chart. However; patient states he is also having pain going down his right leg and notices when he wakes up in the morning his foot is very cool to the touch. Patient appt made today for further evaluation.    Copied from CRM [id number]. Topic: Clinical - Red Word Triage >> Feb 05, 2024 10:03 AM Magdalene [employer] wrote: Red Word that prompted transfer to Nurse Triage: hurt foot, painful leg cramps all night long. Reason for Disposition  [1] MODERATE pain (e.g., interferes with normal activities, limping) AND [2] present > 3 days  Answer Assessment - Initial Assessment Questions 1. ONSET: "When did the pain start?"      May 23 2. LOCATION: "Where is the pain located?"      Right foot 3. PAIN: "How bad is the pain?"    (Scale 1-10; or mild, moderate, severe)  - MILD (1-3): doesn't interfere with normal activities.   - MODERATE (4-7): interferes with normal activities (e.g., work or school) or awakens from sleep, limping.   - SEVERE (8-10): excruciating pain, unable to do any normal activities, unable to walk.      Ranges  5. CAUSE: "What do you think is causing the foot pain?"     Hit the side of the bed 6. OTHER SYMPTOMS: "Do you have any other symptoms?" (e.g., leg pain, rash, fever, numbness)     Leg pain, ankle swelling, foot is cold  Protocols used: Foot Pain-A-AH

## 2024-02-06 ENCOUNTER — Ambulatory Visit: Payer: Self-pay | Admitting: Family

## 2024-02-06 DIAGNOSIS — G4733 Obstructive sleep apnea (adult) (pediatric): Secondary | ICD-10-CM | POA: Diagnosis not present

## 2024-02-06 NOTE — Telephone Encounter (Signed)
 Chief Complaint: Low blood pressure this morning  Symptoms: No current symptoms Pertinent Negatives: Patient denies feeling lightheaded, dizziness, weakness  Disposition: [x] Urgent Care (no appt availability in office)  Additional Notes: Patient states he took his BP this morning at [facility] and it was 80/50. Pt has not checked it since this morning and is not able to recheck it now. Pt got dizzy when looking for a medication. Pt sat down for a while before leaving. Pt states his SBP is normally between 120-140. Pt denies any current symptoms and states he feels great. This RN advised pt to get BP rechecked by going to an urgent care. Pt states he will check his BP and will call.  Copied from CRM [id number]. Topic: Clinical - Red Word Triage >> Feb 06, 2024  4:10 PM Howard Macho wrote: Red Word that prompted transfer to Nurse Triage: patient called stating he took his blood pressure at [facility] and it was 80/50. Patient stated he was dizzy when he looked down in [employer] looking for medication. Patient was told to notify the doctor when it gets low Reason for Disposition  [1] Systolic BP < 90 AND [2] NOT dizzy, lightheaded or weak  Answer Assessment - Initial Assessment Questions BLOOD PRESSURE: "What is the blood pressure?" "Did you take at least two measurements 5 minutes apart?"     80/50 this morning ONSET: "When did you take your blood pressure?"     This morning  HOW: "How did you obtain the blood pressure?" (e.g., visiting nurse, automatic home BP monitor)     [facility] MEDICINES: "Are you taking any medications for blood pressure?" If Yes, ask: "Have they been changed recently?"     Yes  Protocols used: Blood Pressure - Low-A-AH

## 2024-02-07 NOTE — Addendum Note (Signed)
 Addended by: Dorrene Gaucher on: 02/07/2024 02:23 PM   Modules accepted: Level of Service

## 2024-02-08 ENCOUNTER — Telehealth: Payer: Self-pay

## 2024-02-08 NOTE — Telephone Encounter (Signed)
 Patient advised radiology will get him scheduled

## 2024-02-08 NOTE — Telephone Encounter (Signed)
 Initial Comment Caller states he is needing to confirm if the appt. was scheduled for his imaging testing. Additional Comment Provided office hours. Translation No Disp. Time [facility] Time) Disposition Final User 02/07/2024 7:40:52 PM General Information Provided Yes Mcmillian, Charne Final Disposition 02/07/2024 7:40:52 PM General Information Provided Yes Mcmillian, Charne

## 2024-02-11 ENCOUNTER — Ambulatory Visit (HOSPITAL_COMMUNITY)
Admission: RE | Admit: 2024-02-11 | Discharge: 2024-02-11 | Disposition: A | Discharge status: Home / Self Care | Source: Ambulatory Visit | Attending: Surgery | Admitting: Surgery

## 2024-02-11 DIAGNOSIS — R252 Cramp and spasm: Secondary | ICD-10-CM | POA: Diagnosis not present

## 2024-02-11 LAB — VAS US ABI WITH/WO TBI

## 2024-02-12 ENCOUNTER — Telehealth: Payer: Self-pay | Admitting: Neurology

## 2024-02-12 NOTE — Telephone Encounter (Signed)
 Copied from CRM [id number]. Topic: General - Other >> Feb 12, 2024  1:09 PM [name] Pratt wrote: Reason for CRM: Patient wanted to notify provider that he had blood circulation test done yesterday and was told no problem with circulation issues, still has to schedule the bone density test but still having issues with cramps knees to toes.  [name] [phone]

## 2024-02-13 DIAGNOSIS — L570 Actinic keratosis: Secondary | ICD-10-CM | POA: Diagnosis not present

## 2024-02-13 DIAGNOSIS — L821 Other seborrheic keratosis: Secondary | ICD-10-CM | POA: Diagnosis not present

## 2024-02-13 DIAGNOSIS — Z859 Personal history of malignant neoplasm, unspecified: Secondary | ICD-10-CM | POA: Diagnosis not present

## 2024-02-13 DIAGNOSIS — Z85828 Personal history of other malignant neoplasm of skin: Secondary | ICD-10-CM | POA: Diagnosis not present

## 2024-02-13 DIAGNOSIS — Z129 Encounter for screening for malignant neoplasm, site unspecified: Secondary | ICD-10-CM | POA: Diagnosis not present

## 2024-02-14 ENCOUNTER — Telehealth (HOSPITAL_BASED_OUTPATIENT_CLINIC_OR_DEPARTMENT_OTHER): Payer: Self-pay

## 2024-02-15 ENCOUNTER — Inpatient Hospital Stay: Admitting: Family

## 2024-02-15 ENCOUNTER — Telehealth: Payer: Self-pay | Admitting: Family

## 2024-02-15 NOTE — Telephone Encounter (Signed)
 Patient notified dexa scanner still not fixed.

## 2024-02-15 NOTE — Telephone Encounter (Signed)
 Copied from CRM [id number]. Topic: General - Other >> Feb 15, 2024 11:56 AM Martinique E wrote: Reason for CRM: Patient stated he missed a call yesterday and questioning if this has to do with the Dexa machine being back up and working, in order to reschedule appointment. Callback number for patient is [phone] to clarify.

## 2024-02-18 ENCOUNTER — Ambulatory Visit: Payer: Self-pay | Admitting: Family

## 2024-02-18 ENCOUNTER — Other Ambulatory Visit (HOSPITAL_BASED_OUTPATIENT_CLINIC_OR_DEPARTMENT_OTHER)

## 2024-02-18 DIAGNOSIS — M48062 Spinal stenosis, lumbar region with neurogenic claudication: Secondary | ICD-10-CM | POA: Diagnosis not present

## 2024-02-20 ENCOUNTER — Ambulatory Visit: Payer: Medicare HMO | Admitting: Family

## 2024-02-21 ENCOUNTER — Other Ambulatory Visit: Payer: Self-pay | Admitting: Family

## 2024-02-22 ENCOUNTER — Ambulatory Visit (HOSPITAL_BASED_OUTPATIENT_CLINIC_OR_DEPARTMENT_OTHER)
Admission: RE | Admit: 2024-02-22 | Discharge: 2024-02-22 | Disposition: A | Discharge status: Home / Self Care | Source: Ambulatory Visit | Attending: Family | Admitting: Family

## 2024-02-22 ENCOUNTER — Ambulatory Visit (INDEPENDENT_AMBULATORY_CARE_PROVIDER_SITE_OTHER): Admitting: Family

## 2024-02-22 VITALS — BP 139/53 | HR 57 | Temp 97.8°F | Resp 16 | Ht 66.0 in | Wt 153.0 lb

## 2024-02-22 DIAGNOSIS — M419 Scoliosis, unspecified: Secondary | ICD-10-CM | POA: Diagnosis not present

## 2024-02-22 DIAGNOSIS — M545 Low back pain, unspecified: Secondary | ICD-10-CM | POA: Diagnosis not present

## 2024-02-22 DIAGNOSIS — M25552 Pain in left hip: Secondary | ICD-10-CM | POA: Diagnosis not present

## 2024-02-22 DIAGNOSIS — S90221A Contusion of right lesser toe(s) with damage to nail, initial encounter: Secondary | ICD-10-CM | POA: Diagnosis not present

## 2024-02-22 DIAGNOSIS — Z96643 Presence of artificial hip joint, bilateral: Secondary | ICD-10-CM | POA: Diagnosis not present

## 2024-02-22 DIAGNOSIS — M4317 Spondylolisthesis, lumbosacral region: Secondary | ICD-10-CM | POA: Diagnosis not present

## 2024-02-22 DIAGNOSIS — M47816 Spondylosis without myelopathy or radiculopathy, lumbar region: Secondary | ICD-10-CM | POA: Diagnosis not present

## 2024-02-22 NOTE — Assessment & Plan Note (Signed)
  Intermittent pain with spasms post-fall, aggravated by walker use. No x-rays performed. - Order x-rays of hip and lower back.

## 2024-02-22 NOTE — Patient Instructions (Signed)
 VISIT SUMMARY:  During your visit, we addressed your right big toenail injury, back pain from a recent fall, neck and shoulder pain, urinary incontinence, and CPAP mask discomfort. We have planned several follow-ups and imaging studies to better understand and manage your conditions.  YOUR PLAN:  BACK AND HIP PAIN POST-FALL: You have been experiencing intermittent pain and spasms in your back and hip following a fall three weeks ago. -We will order x-rays of your hip and lower back to better understand the cause of your pain.  TRAUMATIZED RIGHT BIG TOENAIL: Your right big toenail is loose and bleeding due to trauma, but there are no signs of infection. -We have referred you to podiatry for evaluation and possible removal of the toenail. -Please monitor the toenail for any signs of infection.  CPAP MASK DISCOMFORT: You are experiencing discomfort and irritation from your CPAP mask. -We will await the results of your recent sleep study. -Consider trying alternative CPAP mask designs to reduce discomfort.  GENERAL HEALTH MAINTENANCE: You are managing multiple conditions with the help of specialists and considering the use of mobility aids. -Continue to follow up with your specialists. -Discuss the benefits of mobility aids with your healthcare providers.  FOLLOW-UP: You have multiple upcoming specialist appointments and imaging studies ordered. -Ensure you follow up with podiatry. -Complete the x-rays of your hip and lower back. -Follow up with your specialists. -Await the results of your sleep study.

## 2024-02-22 NOTE — Progress Notes (Signed)
 Subjective:     Patient ID: [name] Pratt, male    DOB: August 13, 1941, 83 y.o.   MRN: [id number]  Chief Complaint  Patient presents with   Nail Problem    Toenail problem right great toe    HPI  Discussed the use of AI scribe software for clinical note transcription with the patient, who gave verbal consent to proceed.  History of Present Illness  [name] Pratt is an 83 year old male who presents with a right big toenail injury and back pain following a fall.  He has a loose right big toenail with subungual hematoma, attributed to wearing tight shoes. He plans to obtain new footwear to address this issue.  He experiences back pain and spasms following a fall three weeks ago, landing on his buttocks where he previously had surgery. Discomfort occurs when sitting for extended periods. He has not undergone any imaging since the fall. He uses a walker and occasionally a cane for mobility and performs stretching exercises at home.  He experiences irritation from his CPAP mask on his nose and face and awaits results from a recent sleep study. He is being evaluated for a different mask. He continues to use the CPAP machine at night.     Health Maintenance Due  Topic Date Due   Medicare Annual Wellness (AWV)  01/20/2023   COVID-19 Vaccine (6 - Pfizer risk 2024-25 season) 01/14/2024    Past Medical History:  Diagnosis Date   Acute kidney injury (HCC) 05/02/2022   Acute metabolic encephalopathy 04/24/2022   Acute respiratory failure with hypoxia (HCC) 09/14/2023   Adenomatous polyp of ascending colon    Adenomatous polyp of colon    Allergic rhinitis 02/02/2016   Anemia 12/17/2009   Formatting of this note might be different from the original. 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 Hypertension   Benign paroxysmal positional vertigo 09/14/2021   Bilateral sacroiliitis (HCC) 07/22/2021   Body mass index (BMI) 25.0-25.9, adult 11/02/2020   Cancer (HCC)    skin cancer   Cardiac murmur 07/25/2021   Carotid stenosis    a. Carotid U/S 5/13: LICA < 50%, RICA 50-69%;  b.  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   Cystoid macular edema of both eyes 07/28/2020   Degenerative disc disease, lumbar 06/30/2015   Deviated nasal septum 02/18/2015   Diverticulosis 03/03/2020   DJD (degenerative joint disease) of hip 12/24/2012   Drowsiness 12/07/2022   Dry eyes 09/14/2023   Dyspnea 06/12/2011   Emphysema, unspecified (HCC) 03/05/2020   Erectile dysfunction 06/20/2017   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   Fall 09/18/2022   Gait disorder 03/02/2021   Gait instability  03/02/2021   GERD (gastroesophageal reflux disease)    GI bleed 04/21/2022   GOUT 05/13/2009   Qualifier: Diagnosis of  By: Nadean August     Greater trochanteric pain syndrome 07/22/2021   Hand pain, left 01/16/2022   Hand weakness 05/02/2022   Hearing loss 02/02/2016   Hematochezia    Heme positive stool 02/29/2020   Hemorrhagic shock (HCC)    HIATAL HERNIA 05/13/2009   Qualifier: Diagnosis of   By: Nadean August      Replacing diagnoses that were  inactivated after the 12/11/22 regulatory import     High risk medication use 01/18/2012   Hip pain 03/02/2021   History of chicken pox    History of COVID-19 12/05/2021   History of hemorrhoids    History of kidney stones    History of skin cancer 07/22/2021   skin cancer   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  Cardiology - Dr Dillard Frame at [facility] cardiology     ICD-10 cut over     Hyperlipidemia with target LDL less than 130 12/27/2010   Formatting of this note might be different from the original. Cardiology - Dr Dillard Frame at [facility] cardiology  ICD-10 cut over   Hypokalemia 07/08/2021   Hypothyroidism    Internal hemorrhoids 03/03/2020   Iron  deficiency anemia 09/18/2022   Irregular heart beat 02/03/2022   Leg cramps 11/13/2019   Lower GI bleed 12/12/2012   Lumbago of lumbar region with sciatica 10/21/2020   Lumbar radiculopathy 03/02/2021   Lumbar spondylosis 11/02/2020   NEPHROLITHIASIS 05/13/2009   Qualifier: Diagnosis of  By: Nadean August     Nonspecific abnormal electrocardiogram (ECG) (EKG) 01/06/2013   Numbness of hand 01/24/2022   Onychomycosis 06/30/2015   OSA (obstructive sleep apnea) 11/25/2012   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 Osteoarthritis Of The Hand  10/1 IMO update   Osteoarthrosis, unspecified whether generalized or localized, pelvic region and thigh 07/25/2010   Formatting of this note might be different from the original. Osteoarthritis Of The Hip   Other abnormal glucose 07/22/2021   Other allergic rhinitis 02/18/2015   Palpitations 07/25/2021   Peripheral neuropathy 06/19/2016   Pneumonia of both lower lobes due to infectious organism 09/27/2023    Preop examination 12/06/2022   Preventative health care 11/24/2015   Right groin pain 11/29/2012   Right inguinal hernia 05/13/2009   Qualifier: Diagnosis of  By: Nadean August     RLS (restless legs syndrome) 02/18/2015   Rosacea    Rotator cuff tear 07/27/2013   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   Sinus bradycardia 09/14/2023   Situational depression 03/02/2021   Skin lesion 01/16/2022   Skin tear of forearm without complication, left, subsequent encounter 09/18/2022   Sleep apnea with use of continuous positive airway pressure (CPAP) 11/25/2012   CPAP set to  7 cm water,  Residual AHi was 7.8  With no leak.  User time 6 hours.  479 days , not used only 12 days - highly compliant 06-23-13  .    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 Rochell Chroman   Ulnar neuropathy 01/24/2022   Urinary retention 11/26/2020   Vasovagal syncope    Ventral hernia 07/08/2012   Vertigo 07/08/2021   Vitamin D  deficiency 08/07/2022   Weakness of lower extremity 10/10/2022   Weight loss 03/03/2020    Past Surgical History:  Procedure Laterality Date   ABDOMINAL HERNIA REPAIR  07/15/12   Dr Theopolis Flack   ANTERIOR CERVICAL DECOMP/DISCECTOMY FUSION N/A 07/09/2020   Procedure: ANTERIOR CERVICAL DECOMPRESSION AND FUSION CERVICAL THREE-FOUR.;  Surgeon: Gearl Keens, MD;  Location: [facility] OR;  Service: Neurosurgery;  Laterality: N/A;  anterior   BROW LIFT  05/07/01   COLONOSCOPY WITH PROPOFOL  N/A 04/24/2022   Procedure: COLONOSCOPY WITH PROPOFOL ;  Surgeon: Lindle Rhea, MD;  Location: [facility] ENDOSCOPY;  Service: Gastroenterology;  Laterality: N/A;   COLONOSCOPY WITH PROPOFOL  N/A  04/26/2022   Procedure: COLONOSCOPY WITH PROPOFOL ;  Surgeon: Lindle Rhea, MD;  Location: [facility] ENDOSCOPY;  Service: Gastroenterology;  Laterality: N/A;   CYSTOSCOPY WITH URETEROSCOPY AND STENT PLACEMENT Right 01/28/2014   Procedure: CYSTOSCOPY WITH URETEROSCOPY, BASKET RETRIVAL AND  STENT PLACEMENT;  Surgeon: Livingston Rigg, MD;  Location: [facility] ORS;  Service: Urology;  Laterality: Right;   epidural injections     multiple procedures   ESOPHAGEAL DILATION  1993 and 1994   multiple times   EYE SURGERY Bilateral 03/25/10, 2012   cataract removal   HEMORRHOID SURGERY  2014   HIATAL HERNIA REPAIR  12/21/93   HOLMIUM LASER APPLICATION Right 01/28/2014   Procedure: HOLMIUM LASER APPLICATION;  Surgeon: Livingston Rigg, MD;  Location: [facility] ORS;  Service: Urology;  Laterality: Right;   INGUINAL HERNIA REPAIR Right 07/15/12   Dr Theopolis Flack, x2   JOINT REPLACEMENT  07/25/04   right knee   JOINT REPLACEMENT  07/13/10   left knee   KNEE SURGERY  1995   LAPAROSCOPIC CHOLECYSTECTOMY  09/20/06   with intraoperative cholangiogram and right inguinal herniorrhaphy with mesh    LIGAMENT REPAIR Left 07/2013   shoulder   LITHOTRIPSY     x2   LUMBAR LAMINECTOMY/DECOMPRESSION MICRODISCECTOMY Bilateral 12/27/2022   Procedure: Lumbar Two-Three, Lumbar Three-Four Sublaminar Decompression;  Surgeon: Gearl Keens, MD;  Location: [facility] OR;  Service: Neurosurgery;  Laterality: Bilateral;   NASAL SEPTUM SURGERY  1975   POLYPECTOMY  04/26/2022   Procedure: POLYPECTOMY;  Surgeon: Lindle Rhea, MD;  Location: [facility] ENDOSCOPY;  Service: Gastroenterology;;   POSTERIOR CERVICAL FUSION/FORAMINOTOMY N/A 11/24/2020   Procedure: Posterior Cervical Laminectomy Cervical three-four, Cervical four-five with lateral mass fusion;  Surgeon: Gearl Keens, MD;  Location: [facility] OR;  Service: Neurosurgery;  Laterality: N/A;   REFRACTIVE SURGERY Bilateral    Right total hip replacement  4/14   SKIN CANCER DESTRUCTION     nose, ear   TONSILLECTOMY  as child    TOTAL HIP ARTHROPLASTY Left    URETHRAL DILATION  1991   Dr. Inga Manges    Family History  Problem Relation Age of Onset   Cancer Mother    Hypertension Other    Cancer Other    Osteoarthritis Other    Stomach cancer Neg Hx    Colon cancer Neg Hx    Esophageal cancer Neg Hx     Social History   Socioeconomic History   Marital status: Married  Spouse name: Felipa Horsfall   Number of children: 1   Years of education: Not on file   Highest education level: Not on file  Occupational History   Occupation: firefighter    Comment: paints on the side   Occupation: retired  Tobacco Use   Smoking status: Former    Current packs/day: 0.00    Types: Cigarettes    Start date: 09/12/1967    Quit date: 09/11/1978    Years since quitting: 45.4   Smokeless tobacco: Never  Vaping Use   Vaping status: Never Used  Substance and Sexual Activity   Alcohol  use: Not Currently    Alcohol /week: 0.0 standard drinks of alcohol     Comment: rare   Drug use: No   Sexual activity: Not Currently  Other Topics Concern   Not on file  Social History Narrative   Lives with his wife   He has one son- lives locally.   2 grandchildren   Has worked for Warden/ranger   Enjoys wood working   Completed HS.  [employer]   Social Drivers of Corporate investment banker Strain: Low Risk  (10/28/2021)   Overall Financial Resource Strain (CARDIA)    Difficulty of Paying Living Expenses: Not very hard  Food Insecurity: No Food Insecurity (01/14/2024)   Hunger Vital Sign    Worried About Running Out of Food in the Last Year: Never true    Ran Out of Food in the Last Year: Never true  Transportation Needs: No Transportation Needs (01/14/2024)   PRAPARE - Administrator, Civil Service (Medical): No    Lack of Transportation (Non-Medical): No  Physical Activity: Inactive (01/14/2024)   Exercise Vital Sign    Days of Exercise per Week: 0 days    Minutes of Exercise per Session: 0 min  Stress: Stress  Concern Present (01/14/2024)   [employer] of Occupational Health - Occupational Stress Questionnaire    Feeling of Stress : To some extent  Social Connections: Moderately Isolated (09/14/2023)   Social Connection and Isolation Panel    Frequency of Communication with Friends and Family: Three times a week    Frequency of Social Gatherings with Friends and Family: Three times a week    Attends Religious Services: Never    Active Member of Clubs or Organizations: No    Attends Banker Meetings: Never    Marital Status: Married  Catering manager Violence: Not At Risk (01/14/2024)   Humiliation, Afraid, Rape, and Kick questionnaire    Fear of Current or Ex-Partner: No    Emotionally Abused: No    Physically Abused: No    Sexually Abused: No    Outpatient Medications Prior to Visit  Medication Sig Dispense Refill   acetaminophen  (TYLENOL ) 500 MG tablet Take 500 mg by mouth at bedtime as needed for moderate pain (pain score 4-6).     albuterol  (VENTOLIN  HFA) 108 (90 Base) MCG/ACT inhaler Inhale 2 puffs into the lungs every 6 (six) hours as needed for wheezing or shortness of breath. INHALE 2 PUFFS INTO THE LUNGS EVERY 6 HOURS AS NEEDED FOR SHORTNESS OF BREATH. 1 each 3   allopurinol  (ZYLOPRIM ) 100 MG tablet Take 1 tablet (100 mg total) by mouth 2 (two) times daily. 180 tablet 1   amLODipine  (NORVASC ) 2.5 MG tablet Take 1 tablet (2.5 mg total) by mouth 2 (two) times daily.     antiseptic oral rinse (BIOTENE) LIQD 15 mLs by Mouth Rinse route 2 (two) times  daily.     aspirin  EC 81 MG tablet Take 81 mg by mouth at bedtime.     bacitracin  ointment Apply 1 Application topically 2 (two) times daily. 120 g 0   cholestyramine  (QUESTRAN ) 4 GM/DOSE powder Take 4 g by mouth as needed (digestion). 30 ml     diclofenac  Sodium (VOLTAREN ) 1 % GEL Apply topically.     fluticasone  (FLONASE ) 50 MCG/ACT nasal spray Place 1 spray into both nostrils at bedtime.     gabapentin  (NEURONTIN ) 300 MG  capsule Take 1 capsule (300 mg total) by mouth at bedtime. 90 capsule 1   glucose blood test strip TRUE Metrix Blood Glucose Test Strip, use as instructed 100 each 12   hydrALAZINE  (APRESOLINE ) 50 MG tablet Take 1 tablet (50 mg total) by mouth 3 (three) times daily. 270 tablet 0   hydrochlorothiazide  (HYDRODIURIL ) 25 MG tablet Take 1 tablet (25 mg total) by mouth daily. 30 tablet 3   Iron , Ferrous Sulfate , 325 (65 Fe) MG TABS Take 325 mg by mouth every other day. 30 tablet 0   Lancets MISC 1 each by Does not apply route 2 (two) times daily. TRUE matrix lancets 100 each 3   levothyroxine  (SYNTHROID ) 112 MCG tablet TAKE 1 TABLET BY MOUTH ONCE DAILY BEFORE BREAKFAST TAKE  WITH  ADDITIONAL  TO  50  MCG  TABLETS 90 tablet 0   levothyroxine  (SYNTHROID ) 50 MCG tablet TAKE 1 TABLET BY MOUTH BEFORE BREAKFAST TAKE  WITH  ADDITIONAL  TO  112  MCG  TABLETS. 90 tablet 0   loratadine  (CLARITIN ) 10 MG tablet Take 10 mg by mouth daily as needed for allergies or rhinitis.     Magnesium  500 MG TABS Take 500 mg by mouth daily.     Menthol , Topical Analgesic, (ICY HOT EX) Apply 1 Application topically at bedtime. Lidocaine  / on back     metroNIDAZOLE  (METROCREAM ) 0.75 % cream Apply 1 Application topically daily as needed (head).     multivitamin-lutein  (OCUVITE-LUTEIN ) CAPS capsule Take 1 capsule by mouth daily.     nitroGLYCERIN  (NITROSTAT ) 0.4 MG SL tablet Place 1 tablet (0.4 mg total) under the tongue every 5 (five) minutes as needed for chest pain. 25 tablet 6   OVER THE COUNTER MEDICATION Place 1 spray into both nostrils 2 (two) times daily. Sinus saline     OVER THE COUNTER MEDICATION Take 15 mLs by mouth at bedtime. Yellow Mustard     polyethylene glycol powder (GLYCOLAX /MIRALAX ) 17 GM/SCOOP powder Take 17 g by mouth 2 (two) times daily as needed for mild constipation, moderate constipation or severe constipation. 255 g 0   polyvinyl alcohol  (ARTIFICIAL TEARS) 1.4 % ophthalmic solution Place 1 drop into both  eyes daily as needed for dry eyes.     PREBIOTIC PRODUCT PO Take 1 tablet by mouth daily.     rosuvastatin  (CRESTOR ) 5 MG tablet Take 1 tablet (5 mg total) by mouth daily. 90 tablet 0   senna-docusate (SENOKOT-S) 8.6-50 MG tablet Take 2 tablets by mouth 2 (two) times daily. 120 tablet 0   valsartan  (DIOVAN ) 320 MG tablet Take 1 tablet (320 mg total) by mouth daily. 90 tablet 1   vitamin B-12 (CYANOCOBALAMIN ) 500 MCG tablet Take 500 mcg by mouth in the morning.     No facility-administered medications prior to visit.    Allergies  Allergen Reactions   Pravastatin  Other (See Comments)    Muscle cramps   Sildenafil  Other (See Comments)    Tachycardia  Oxycodone  Other (See Comments)    Hallucinations    Atenolol Other (See Comments)    Bradycardia    Metoclopramide Hcl Anxiety    ROS See HPI    Objective:    Physical Exam Constitutional:      General: He is not in acute distress.    Appearance: He is well-developed.  HENT:     Head: Normocephalic and atraumatic.   Cardiovascular:     Rate and Rhythm: Normal rate and regular rhythm.  Pulmonary:     Effort: Pulmonary effort is normal.   Skin:    General: Skin is warm and dry.     Comments: Left great toenail is loose with blood noted under the nail   Neurological:     Mental Status: He is alert and oriented to person, place, and time.   Psychiatric:        Behavior: Behavior normal.        Thought Content: Thought content normal.       BP (!) 139/53 (BP Location: Right Arm, Patient Position: Sitting, Cuff Size: Small)   Pulse (!) 57   Temp 97.8 F (36.6 C) (Oral)   Resp 16   Ht 5' 6 (1.676 m)   Wt 153 lb (69.4 kg)   SpO2 98%   BMI 24.69 kg/m  Wt Readings from Last 3 Encounters:  02/22/24 153 lb (69.4 kg)  02/05/24 155 lb 9.6 oz (70.6 kg)  02/01/24 157 lb (71.2 kg)       Assessment & Plan:   Problem List Items Addressed This Visit       Unprioritized   Subungual hematoma of toenail of  right foot - Primary   New. No sign of infection. Refer to podiatry for further management and possible toenail removal.      Relevant Orders   Ambulatory referral to Podiatry   Left-sided low back pain without sciatica    Intermittent pain with spasms post-fall, aggravated by walker use. No x-rays performed. - Order x-rays of hip and lower back.      Relevant Orders   DG HIP UNILAT WITH PELVIS 2-3 VIEWS LEFT   DG Lumbar Spine Complete    I am having Marcellis B. Darin Edinger maintain his aspirin  EC, glucose blood, Lancets, Magnesium , vitamin B-12, loratadine , antiseptic oral rinse, artificial tears, Iron  (Ferrous Sulfate ), bacitracin , PREBIOTIC PRODUCT PO, acetaminophen , fluticasone , (Menthol , Topical Analgesic, (ICY HOT EX)), metroNIDAZOLE , OVER THE COUNTER MEDICATION, cholestyramine , OVER THE COUNTER MEDICATION, multivitamin-lutein , polyethylene glycol powder, gabapentin , senna-docusate, albuterol , nitroGLYCERIN , hydrALAZINE , valsartan , hydrochlorothiazide , amLODipine , allopurinol , diclofenac  Sodium, levothyroxine , levothyroxine , and rosuvastatin .  No orders of the defined types were placed in this encounter.

## 2024-02-22 NOTE — Assessment & Plan Note (Signed)
 New. No sign of infection. Refer to podiatry for further management and possible toenail removal.

## 2024-02-25 ENCOUNTER — Telehealth: Payer: Self-pay | Admitting: Family

## 2024-02-25 NOTE — Telephone Encounter (Signed)
 Patient reports he figured it out and he does not need anything from us  at this time

## 2024-02-25 NOTE — Telephone Encounter (Signed)
 Copied from CRM [id number]. Topic: General - Call Back - No Documentation >> Feb 22, 2024  3:33 PM Taleah C wrote: Reason for CRM: pt called and stated that he missed a call from someone in the office today but I did not see any documentation.

## 2024-02-26 ENCOUNTER — Ambulatory Visit: Payer: Self-pay | Admitting: Family

## 2024-02-26 ENCOUNTER — Telehealth: Payer: Self-pay | Admitting: *Deleted

## 2024-02-26 NOTE — Telephone Encounter (Unsigned)
 Copied from CRM [id number]. Topic: Clinical - Medical Advice >> Feb 26, 2024  4:31 PM Aisha D wrote: Reason for CRM: Pt stated that his spine is still bothering him and would like to get some medical advice regarding this. Pt was made aware of his recent lab results and stated that he is currently taking some medication for the pain but would like to speak with Dr.O'Sullivan's nurse regarding this concern.

## 2024-02-27 DIAGNOSIS — H35073 Retinal telangiectasis, bilateral: Secondary | ICD-10-CM | POA: Diagnosis not present

## 2024-02-27 DIAGNOSIS — H353211 Exudative age-related macular degeneration, right eye, with active choroidal neovascularization: Secondary | ICD-10-CM | POA: Diagnosis not present

## 2024-02-27 DIAGNOSIS — H35353 Cystoid macular degeneration, bilateral: Secondary | ICD-10-CM | POA: Diagnosis not present

## 2024-02-28 ENCOUNTER — Ambulatory Visit: Admitting: Podiatry

## 2024-02-28 NOTE — Telephone Encounter (Signed)
 Called patient, he said he is ok back pain getting better

## 2024-03-03 ENCOUNTER — Other Ambulatory Visit: Payer: Self-pay

## 2024-03-03 ENCOUNTER — Other Ambulatory Visit: Payer: Self-pay | Admitting: Licensed Clinical Social Worker

## 2024-03-03 NOTE — Patient Outreach (Signed)
 Complex Care Management   Visit Note  03/03/2024  Name:  [name] Pratt MRN: [id number] DOB: August 11, 1941  Situation: Referral received for Complex Care Management related to client management of stress issues and of management of medical needs I obtained verbal consent from Patient.  Visit completed with patient   on the phone  Background:   Past Medical History:  Diagnosis Date   Acute kidney injury (HCC) 05/02/2022   Acute metabolic encephalopathy 04/24/2022   Acute respiratory failure with hypoxia (HCC) 09/14/2023   Adenomatous polyp of ascending colon    Adenomatous polyp of colon    Allergic rhinitis 02/02/2016   Anemia 12/17/2009   Formatting of this note might be different from the original. 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 Hypertension   Benign paroxysmal positional vertigo 09/14/2021   Bilateral sacroiliitis (HCC) 07/22/2021   Body mass index (BMI) 25.0-25.9, adult 11/02/2020   Cancer (HCC)    skin cancer   Cardiac murmur 07/25/2021   Carotid stenosis    a. Carotid U/S 5/13: LICA < 50%, RICA 50-69%;  b.  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   Cystoid macular edema of both eyes 07/28/2020   Degenerative disc disease,  lumbar 06/30/2015   Deviated nasal septum 02/18/2015   Diverticulosis 03/03/2020   DJD (degenerative joint disease) of hip 12/24/2012   Drowsiness 12/07/2022   Dry eyes 09/14/2023   Dyspnea 06/12/2011   Emphysema, unspecified (HCC) 03/05/2020   Erectile dysfunction 06/20/2017   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   Fall 09/18/2022   Gait disorder 03/02/2021   Gait instability 03/02/2021   GERD (gastroesophageal reflux disease)    GI bleed 04/21/2022   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   Qualifier: Diagnosis of  By: Gwenn Runell     Greater trochanteric pain syndrome 07/22/2021   Hand pain, left 01/16/2022   Hand weakness 05/02/2022   Hearing loss 02/02/2016   Hematochezia    Heme positive stool 02/29/2020   Hemorrhagic shock (HCC)    HIATAL HERNIA 05/13/2009   Qualifier: Diagnosis of   By: Gwenn Runell      Replacing diagnoses that were inactivated after the 12/11/22 regulatory import     High risk medication use 01/18/2012   Hip pain 03/02/2021   History of chicken pox    History of COVID-19 12/05/2021   History of hemorrhoids    History of kidney stones    History of skin cancer 07/22/2021   skin cancer   History of total bilateral knee replacement 07/08/2020   HTN (hypertension) 05/13/2009            Hyperglycemia 03/03/2014   Hyperlipidemia 12/27/2010   Formatting of this note might be different  from the original.  Cardiology - Dr Orlean at [facility] cardiology     ICD-10 cut over     Hyperlipidemia with target LDL less than 130 12/27/2010   Formatting of this note might be different from the original. Cardiology - Dr Orlean at [facility] cardiology  ICD-10 cut over   Hypokalemia 07/08/2021   Hypothyroidism    Internal hemorrhoids 03/03/2020   Iron  deficiency anemia 09/18/2022   Irregular heart beat 02/03/2022   Leg cramps 11/13/2019   Lower GI bleed 12/12/2012   Lumbago of lumbar region with sciatica  10/21/2020   Lumbar radiculopathy 03/02/2021   Lumbar spondylosis 11/02/2020   NEPHROLITHIASIS 05/13/2009   Qualifier: Diagnosis of  By: Gwenn Grimes     Nonspecific abnormal electrocardiogram (ECG) (EKG) 01/06/2013   Numbness of hand 01/24/2022   Onychomycosis 06/30/2015   OSA (obstructive sleep apnea) 11/25/2012   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 Osteoarthritis Of The Hand  10/1 IMO update   Osteoarthrosis, unspecified whether generalized or localized, pelvic region and thigh 07/25/2010   Formatting of this note might be different from the original. 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  User time 6 hours.  479 days , not used only 12 days - highly compliant 06-23-13  .    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   Urinary retention 11/26/2020   Vasovagal syncope    Ventral hernia 07/08/2012   Vertigo 07/08/2021   Vitamin D  deficiency 08/07/2022   Weakness of lower extremity 10/10/2022   Weight loss 03/03/2020    Assessment: Patient Reported Symptoms:  Cognitive Cognitive Status: Alert and oriented to person, place, and time, Difficulties with attention and concentration   Health Maintenance Behaviors: Sleep adequate, Social activities Health Facilitated by: Rest, Stress management  Neurological Neurological Review of Symptoms: Weakness Neurological Management Strategies: Adequate rest  HEENT HEENT Symptoms Reported: Change or loss of hearing HEENT Management Strategies: Adequate rest, Coping strategies    Cardiovascular Cardiovascular Symptoms Reported: Fatigue Cardiovascular Conditions: Hypertension Cardiovascular Management Strategies: Adequate rest  Respiratory Respiratory Symptoms Reported: Shortness of breath Other Respiratory Symptoms: dyspnea; OSA Respiratory Conditions: COPD, Shortness of  breath, Sleep disordered breathing  Endocrine Patient reports the following symptoms related to hypoglycemia or hyperglycemia : Weakness or fatigue, Shortness of breath Endocrine Conditions: Thyroid  disorder Endocrine Management Strategies: Adequate rest  Gastrointestinal Gastrointestinal Symptoms Reported: No symptoms reported Additional Gastrointestinal Details: GERD Gastrointestinal Management Strategies: Adequate rest, Coping strategies Nutrition Risk Screen (CP): No indicators present  Genitourinary Genitourinary Symptoms Reported: Frequency Genitourinary Conditions: Chronic kidney disease, Frequency Genitourinary Management Strategies: Activity, Adequate rest, Coping strategies  Integumentary Integumentary Symptoms Reported: Skin  changes Additional Integumentary Details: recent fall; bruise on buttocks area Skin Management Strategies: Adequate rest, Coping strategies  Musculoskeletal Musculoskelatal Symptoms Reviewed: Difficulty walking, Weakness, Unsteady gait Musculoskeletal Conditions: Unsteady gait (has pain from recent fall) Musculoskeletal Management Strategies: Adequate rest Falls in the past year?: Yes Number of falls in past year: 1 or less Was there an injury with Fall?: No Fall Risk Category Calculator: 1 Patient Fall Risk Level: Low Fall Risk    Psychosocial Psychosocial Symptoms Reported: Anxiety - if selected complete GAD, Depression - if selected complete PHQ 2-9, Sadness - if selected complete PHQ 2-9 Behavioral Health Conditions: Anxiety, Depression Behavioral Management Strategies: Adequate rest, Coping strategies Major Change/Loss/Stressor/Fears (CP): Medical condition, self Techniques to Cope with Loss/Stress/Change: Counseling, Diversional activities Quality of Family Relationships: supportive Do you feel physically threatened by others?: No      03/03/2024    1:26 PM  Depression screen PHQ 2/9  Decreased Interest 1  Down, Depressed, Hopeless 1  PHQ - 2 Score 2  Altered sleeping 1  Tired, decreased energy 2  Change in appetite 0  Feeling bad or failure about yourself  1  Trouble concentrating 1  Moving slowly or fidgety/restless 1  Suicidal thoughts 0  PHQ-9 Score 8  Difficult doing work/chores Somewhat difficult    Vitals:  BP: (!) 139/53 on 02/22/24    Medications Reviewed Today     Reviewed by Frances Ozell RAMAN, LCSW (Social Worker) on 03/03/24 at 1354  Med List Status: <None>   Medication Order Taking? Sig Documenting Provider Last Dose Status Informant  acetaminophen  (TYLENOL ) 500 MG tablet [id number] Yes Take 500 mg by mouth at bedtime as needed for moderate pain (pain score 4-6). [provider]  Active Self, Pharmacy Records  albuterol  (VENTOLIN  HFA) 108 [phone]  Base) MCG/ACT inhaler [id number] Yes Inhale 2 puffs into the lungs every 6 (six) hours as needed for wheezing or shortness of breath. INHALE 2 PUFFS INTO THE LUNGS EVERY 6 HOURS AS NEEDED FOR SHORTNESS OF BREATH. Daryl Setter, NP  Active   allopurinol  (ZYLOPRIM ) 100 MG tablet [id number] Yes Take 1 tablet (100 mg total) by mouth 2 (two) times daily. Daryl Setter, NP  Active   amLODipine  (NORVASC ) 2.5 MG tablet [id number] Yes Take 1 tablet (2.5 mg total) by mouth 2 (two) times daily. Daryl Setter, NP  Active   antiseptic oral rinse KEENAN) LIQD [id number] Yes 15 mLs by Mouth Rinse route 2 (two) times daily. [provider]  Active Self, Pharmacy Records  aspirin  EC 81 MG tablet [id number] Yes Take 81 mg by mouth at bedtime. [provider]  Active Self, Pharmacy Records           Med Note [facility] [place], [place]   Fri Sep 14, 2023 12:02 PM)    bacitracin  ointment [id number] Yes Apply 1 Application topically 2 (two) times daily. Ruthe Cornet, DO  Active Self, Pharmacy Records  cholestyramine  (QUESTRAN ) 4 GM/DOSE powder [id number] Not taking Take 4 g by mouth as needed (digestion).  30 ml  Patient not taking: Reported on 03/03/2024   [provider]  Active Self, Pharmacy Records  diclofenac  Sodium (VOLTAREN ) 1 % GEL [id number] Yes Apply topically. [provider]  Active   fluticasone  (FLONASE ) 50 MCG/ACT nasal spray [id number] Yes Place 1 spray into both nostrils at bedtime. [provider]  Active Self, Pharmacy Records  gabapentin  (NEURONTIN ) 300 MG capsule [id number] Yes Take 1 capsule (300 mg total) by mouth at bedtime. Daryl Setter, NP  Active Self, Pharmacy Records  glucose blood test strip [id number] Yes TRUE Metrix Blood Glucose Test Strip, use as instructed Daryl Setter, NP  Active Self, Pharmacy Records  hydrALAZINE  (APRESOLINE ) 50 MG tablet [id number] Yes Take 1 tablet (50 mg total) by mouth 3 (three) times daily.  Daryl Setter, NP  Active   hydrochlorothiazide  (HYDRODIURIL ) 25 MG tablet [id number] Yes Take 1 tablet (25 mg total) by mouth daily. Daryl Setter, NP  Active   Iron , Ferrous Sulfate , 325 (65 Fe) MG TABS [id number] Yes Take 325 mg by mouth every other day. Daryl Setter, NP  Active Self, Pharmacy Records  Lancets MISC [id number] Yes 1 each by Does not apply route 2 (two) times daily. TRUE matrix lancets Daryl Setter, NP  Active Self, Pharmacy Records  levothyroxine  (SYNTHROID ) 112 MCG tablet [id number] Yes TAKE 1 TABLET BY MOUTH ONCE DAILY BEFORE BREAKFAST TAKE  WITH  ADDITIONAL  TO  50  MCG  TABLETS O'Sullivan, Melissa, NP  Active   levothyroxine  (SYNTHROID ) 50 MCG tablet [id number] Yes TAKE 1 TABLET BY MOUTH BEFORE BREAKFAST TAKE  WITH  ADDITIONAL  TO  112  MCG  TABLETS. Daryl Setter, NP  Active   loratadine  (CLARITIN ) 10 MG tablet [id number] taking Take 10 mg by mouth daily as needed for allergies or rhinitis. [provider]  Active Self, Pharmacy Records  Magnesium  500 MG TABS [id number] Yes Take 500 mg by mouth daily. [provider]  Active Self, Pharmacy Records  Menthol , Topical Analgesic, (ICY HOT EX) [id number] Yes Apply 1 Application topically at bedtime. Lidocaine  / on back [provider]  Active Self, Pharmacy Records  metroNIDAZOLE  (METROCREAM ) 0.75 % cream [id number] Yes Apply 1 Application topically daily as needed (head). [provider]  Active Self, Pharmacy Records  multivitamin-lutein  (OCUVITE-LUTEIN ) CAPS capsule [id number] Yes Take 1 capsule by mouth daily. [provider]  Active Self, Pharmacy Records  nitroGLYCERIN  (NITROSTAT ) 0.4 MG SL tablet [id number] Yes Place 1 tablet (0.4 mg total) under the tongue every 5 (five) minutes as needed for chest pain. Daryl Setter, NP  Active   OVER THE COUNTER MEDICATION [id number] unknown Place 1 spray into both nostrils 2 (two) times daily. Sinus saline [provider]  Active Self, Pharmacy Records  OVER THE COUNTER MEDICATION [id number] unknown Take 15 mLs by mouth at bedtime. Yellow Mustard [provider]  Active Self, Pharmacy Records  polyethylene glycol powder (GLYCOLAX /MIRALAX ) 17 GM/SCOOP powder [id number] Not taking Take 17 g by mouth 2 (two) times daily as needed for mild constipation, moderate constipation or severe constipation.  Patient not taking: Reported on 03/03/2024   Desiderio Chew, PA-C  Active Self, Pharmacy Records  polyvinyl alcohol  (ARTIFICIAL TEARS) 1.4 % ophthalmic solution [id number] Yes Place 1 drop into both eyes daily as needed for dry eyes. [provider]  Active Self, Pharmacy Records  PREBIOTIC PRODUCT PO [id number] Yes Take 1 tablet by mouth daily. [provider]  Active Self, Pharmacy Records  rosuvastatin  (CRESTOR ) 5 MG tablet [id number] Yes Take 1 tablet (  5 mg total) by mouth daily. Daryl Setter, NP  Active   senna-docusate (SENOKOT-S) 8.6-50 MG tablet [id number] Yes Take 2 tablets by mouth 2 (two) times daily. Krishnan, Gokul, MD  Active   valsartan  (DIOVAN ) 320 MG tablet [id number] Yes Take 1 tablet (320 mg total) by mouth daily. Daryl Setter, NP  Active   vitamin B-12 (CYANOCOBALAMIN ) 500 MCG tablet [id number] Yes Take 500 mcg by mouth in the morning. [provider]  Active Self, Pharmacy Records            Recommendation:   PCP Follow-up Continue Current Plan of Care Client to attend scheduled medical appointments Client to allow time for ADLs completion Client to call LCSW as needed for SW support Client to take medications as prescribed Client to allow time to enjoy social activities of choice and recreational activities of choice  Follow Up Plan:   Telephone follow up appointment date/time:  04/01/24 at 11:00 AM   Glendia Pear  MSW, LCSW [place] Licensed Clinical Social Worker Direct Dial:   [phone] Fax:  [phone] Website:  [email or web address]

## 2024-03-03 NOTE — Patient Instructions (Signed)
 Visit Information  Thank you for taking time to visit with me today. Please don't hesitate to contact me if I can be of assistance to you before our next scheduled appointment.  Our next appointment is by telephone on 04/01/24 at 11:00 AM   Please call the care guide team at [phone] if you need to cancel or reschedule your appointment.   Following is a copy of your care plan:   Goals Addressed             This Visit's Progress    VBCI Social Work Care Plan       Problems:   Sleep challenges             Pain issue             Some transport needs             Some challenges with memory             Stress in managing medical needs              Challenges in managing medications  CSW Clinical Goal(s):   Over the next 30 days the Patient will attend all scheduled medical appointments as evidenced by patient report and care team review of appointment completion in electronic MEDICAL RECORD NUMBER .           Over next 30 days patient will allow time for ADLs completion, rest and relaxation activities and social activities to alleviate stress issues faced AEB patient report of reduction in stress issues  Interventions:  Communicated with client about current needs of client             Discussed client upcoming visit with urologist             Discussed medication procurement of client             Discussed recent fall of client. He said he had bruise area on his buttocks             Discussed transport needs of client             Discussed client ambulation . He uses walker to help him walk              Patient Goals/Self-Care Activities:  Attend scheduled medical appointments            Take medications as prescribed             Call LCSW as needed for SW support              Attend appointments as scheduled with PCP           Allow time for ADLs completion           Use cane or walker as needed to help him ambulate           Communicate as needed with PCP about pain issues  of client           Plan:   Telephone follow up appointment with care management team member scheduled for:  04/01/24 at 11:00 AM        Please go to [facility] [address] [place] [phone]) if you are experiencing a Mental Health or Behavioral Health Crisis or need someone to talk to.  The patient verbalized understanding of instructions, educational materials, and care plan provided today and DECLINED offer to receive copy of patient instructions,  educational materials, and care plan.    Glendia Pear  MSW, LCSW [facility] Licensed Clinical Social Worker Direct Dial:  [phone] Fax:  [phone] Website:  [email or web address]

## 2024-03-10 ENCOUNTER — Other Ambulatory Visit (HOSPITAL_BASED_OUTPATIENT_CLINIC_OR_DEPARTMENT_OTHER): Payer: Self-pay

## 2024-03-10 ENCOUNTER — Other Ambulatory Visit: Payer: Self-pay | Admitting: Family

## 2024-03-10 DIAGNOSIS — I10 Essential (primary) hypertension: Secondary | ICD-10-CM

## 2024-03-10 MED ORDER — HYDROCHLOROTHIAZIDE 25 MG PO TABS
25.0000 mg | ORAL_TABLET | Freq: Every day | ORAL | 0 refills | Status: DC
  Filled 2024-03-10: qty 90, 90d supply, fill #0

## 2024-03-10 NOTE — Telephone Encounter (Signed)
 Copied from CRM [id number]. Topic: Clinical - Medication Refill >> Mar 10, 2024  9:43 AM Kevelyn M wrote: Medication: hydrochlorothiazide  (HYDRODIURIL ) 25 MG tablet   Has the patient contacted their pharmacy? No (Agent: If no, request that the patient contact the pharmacy for the refill. If patient does not wish to contact the pharmacy document the reason why and proceed with request.) (Agent: If yes, when and what did the pharmacy advise?)  This is the patient's preferred pharmacy:   [facility] [place] [facility] [address], [address] [place] [address] Phone: [phone] Fax: [phone]  Is this the correct pharmacy for this prescription? Yes If no, delete pharmacy and type the correct one.   Has the prescription been filled recently? No  Is the patient out of the medication? No, 2 pills left  Has the patient been seen for an appointment in the last year OR does the patient have an upcoming appointment? Yes  Can we respond through MyChart? No  Agent: Please be advised that Rx refills may take up to 3 business days. We ask that you follow-up with your pharmacy.

## 2024-03-11 ENCOUNTER — Other Ambulatory Visit (HOSPITAL_BASED_OUTPATIENT_CLINIC_OR_DEPARTMENT_OTHER): Payer: Self-pay

## 2024-03-12 ENCOUNTER — Encounter: Payer: Self-pay | Admitting: Podiatry

## 2024-03-12 ENCOUNTER — Ambulatory Visit: Admitting: Family

## 2024-03-12 ENCOUNTER — Other Ambulatory Visit (HOSPITAL_BASED_OUTPATIENT_CLINIC_OR_DEPARTMENT_OTHER): Payer: Self-pay

## 2024-03-12 ENCOUNTER — Ambulatory Visit: Admitting: Podiatry

## 2024-03-12 DIAGNOSIS — L84 Corns and callosities: Secondary | ICD-10-CM | POA: Diagnosis not present

## 2024-03-12 DIAGNOSIS — S99921A Unspecified injury of right foot, initial encounter: Secondary | ICD-10-CM

## 2024-03-12 MED ORDER — MUPIROCIN 2 % EX OINT
1.0000 | TOPICAL_OINTMENT | Freq: Two times a day (BID) | CUTANEOUS | 2 refills | Status: DC
  Filled 2024-03-12: qty 22, 11d supply, fill #0

## 2024-03-12 NOTE — Progress Notes (Signed)
 Subjective:  Patient ID: [name] Pratt, male    DOB: 02-Jul-1941,   MRN: [id number]  Chief Complaint  Patient presents with   Nail Problem    Patient states that his right hallux toe nail is coming off, it has some redness around it. Also the patient 5th toe has an ingrown to nail. Causing a lot of pain     83 y.o. male presents for new concern of right great toenail coming off. Relates this started a few days ago. Part of the nail came off and notices some bleeding. Has had some redness around the toe. Also relates lesion on fifth toe on left still painful and has returned.  Denies any other pedal complaints. Denies n/v/f/c.   Past Medical History:  Diagnosis Date   Acute kidney injury (HCC) 05/02/2022   Acute metabolic encephalopathy 04/24/2022   Acute respiratory failure with hypoxia (HCC) 09/14/2023   Adenomatous polyp of ascending colon    Adenomatous polyp of colon    Allergic rhinitis 02/02/2016   Anemia 12/17/2009   Formatting of this note might be different from the original. 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 Hypertension   Benign paroxysmal positional vertigo 09/14/2021   Bilateral sacroiliitis (HCC) 07/22/2021   Body mass index (BMI) 25.0-25.9, adult 11/02/2020   Cancer (HCC)    skin cancer   Cardiac murmur 07/25/2021   Carotid stenosis    a. Carotid U/S 5/13: LICA < 50%, RICA 50-69%;  b.  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   Cystoid macular edema of both eyes 07/28/2020   Degenerative disc disease, lumbar 06/30/2015   Deviated nasal septum 02/18/2015   Diverticulosis 03/03/2020   DJD (degenerative joint disease) of hip 12/24/2012   Drowsiness 12/07/2022   Dry eyes 09/14/2023   Dyspnea 06/12/2011   Emphysema, unspecified (HCC) 03/05/2020   Erectile dysfunction 06/20/2017   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   Fall 09/18/2022   Gait disorder 03/02/2021   Gait instability 03/02/2021   GERD (gastroesophageal reflux disease)    GI bleed 04/21/2022   GOUT 05/13/2009   Qualifier: Diagnosis of  By: Gwenn Runell     Greater trochanteric pain syndrome 07/22/2021   Hand pain, left 01/16/2022   Hand weakness 05/02/2022   Hearing loss 02/02/2016   Hematochezia    Heme positive stool 02/29/2020   Hemorrhagic shock (HCC)    HIATAL HERNIA 05/13/2009   Qualifier: Diagnosis of   By: Gwenn Runell      Replacing diagnoses that were inactivated after the 12/11/22 regulatory import     High risk medication use 01/18/2012   Hip pain 03/02/2021   History of chicken pox    History  of COVID-19 12/05/2021   History of hemorrhoids    History of kidney stones    History of skin cancer 07/22/2021   skin cancer   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  Cardiology - Dr Orlean at [facility] cardiology     ICD-10 cut over     Hyperlipidemia with target LDL less than 130 12/27/2010   Formatting of this note might be different from the original. Cardiology -  Dr Orlean at [facility] cardiology  ICD-10 cut over   Hypokalemia 07/08/2021   Hypothyroidism    Internal hemorrhoids 03/03/2020   Iron  deficiency anemia 09/18/2022   Irregular heart beat 02/03/2022   Leg cramps 11/13/2019   Lower GI bleed 12/12/2012   Lumbago of lumbar region with sciatica 10/21/2020   Lumbar radiculopathy 03/02/2021   Lumbar spondylosis 11/02/2020   NEPHROLITHIASIS 05/13/2009   Qualifier: Diagnosis of  By: Gwenn Grimes     Nonspecific abnormal electrocardiogram (ECG) (EKG) 01/06/2013   Numbness of hand 01/24/2022   Onychomycosis 06/30/2015   OSA (obstructive sleep apnea) 11/25/2012   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 Osteoarthritis Of The Hand  10/1 IMO update   Osteoarthrosis, unspecified whether generalized or localized, pelvic region and thigh 07/25/2010   Formatting of this note might be different from the original. 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  User time 6 hours.  479 days , not used only 12 days - highly  compliant 06-23-13  .    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   Urinary retention 11/26/2020   Vasovagal syncope    Ventral hernia 07/08/2012   Vertigo 07/08/2021   Vitamin D  deficiency 08/07/2022   Weakness of lower extremity 10/10/2022   Weight loss 03/03/2020    Objective:  Physical Exam: Vascular: DP/PT pulses 2/4 bilateral. CFT <3 seconds. Normal hair growth on digits. No edema.  Skin. No lacerations or abrasions bilateral feet. Right hallux nail bed healing  some mild erythema noted to proximal nail fold. Most of nail plate removed. Left fifth digit medial aspect with hyperkeratosis noted.  Musculoskeletal: MMT 5/5 bilateral lower extremities in DF, PF, Inversion and Eversion. Deceased ROM in DF of ankle joint.  Neurological: Sensation intact to light touch.   Assessment:   1. Heloma molle   2. Injury of right great toe, initial encounter      Plan:  Patient was evaluated and treated and all questions answered. Discussed hallux nail avulsion and mild cellulitis around nail.  Prescription for mupricocin provided to be applied daily for at least next two weeks.  Advised on soaking.  Hyperkeratotic lesion on left fifth toe debrided with chisel without incident.  Return as needed.    Asberry Failing, DPM

## 2024-03-18 ENCOUNTER — Encounter (INDEPENDENT_AMBULATORY_CARE_PROVIDER_SITE_OTHER): Payer: Self-pay | Admitting: Otolaryngology

## 2024-03-18 ENCOUNTER — Ambulatory Visit (INDEPENDENT_AMBULATORY_CARE_PROVIDER_SITE_OTHER): Admitting: Otolaryngology

## 2024-03-18 VITALS — BP 161/72 | HR 64 | Ht 66.0 in | Wt 135.0 lb

## 2024-03-18 DIAGNOSIS — R0981 Nasal congestion: Secondary | ICD-10-CM

## 2024-03-18 DIAGNOSIS — J342 Deviated nasal septum: Secondary | ICD-10-CM | POA: Diagnosis not present

## 2024-03-18 DIAGNOSIS — J310 Chronic rhinitis: Secondary | ICD-10-CM

## 2024-03-18 DIAGNOSIS — J343 Hypertrophy of nasal turbinates: Secondary | ICD-10-CM | POA: Diagnosis not present

## 2024-03-18 DIAGNOSIS — R0982 Postnasal drip: Secondary | ICD-10-CM | POA: Diagnosis not present

## 2024-03-18 DIAGNOSIS — Z87891 Personal history of nicotine dependence: Secondary | ICD-10-CM

## 2024-03-19 DIAGNOSIS — J310 Chronic rhinitis: Secondary | ICD-10-CM | POA: Insufficient documentation

## 2024-03-19 DIAGNOSIS — J343 Hypertrophy of nasal turbinates: Secondary | ICD-10-CM | POA: Insufficient documentation

## 2024-03-19 DIAGNOSIS — R0982 Postnasal drip: Secondary | ICD-10-CM | POA: Insufficient documentation

## 2024-03-19 NOTE — Progress Notes (Signed)
 CC: Chronic nasal obstruction, chronic postnasal drainage  HPI:  [name] Pratt is a 83 y.o. male who presents today complaining of chronic nasal obstruction and chronic postnasal drainage.  According to the patient, he has been symptomatic for more than 5 years.  The nasal obstruction has caused difficulty using his CPAP machine.  He previously underwent nasal surgery more than 20 years ago, with temporary improvement in his nasal breathing.  He has a history of environmental allergies.  He was treated with Flonase  nasal spray and nasal saline irrigation, but without significant improvement in his breathing.  He denies any recent sinusitis.  Past Medical History:  Diagnosis Date   Acute kidney injury (HCC) 05/02/2022   Acute metabolic encephalopathy 04/24/2022   Acute respiratory failure with hypoxia (HCC) 09/14/2023   Adenomatous polyp of ascending colon    Adenomatous polyp of colon    Allergic rhinitis 02/02/2016   Anemia 12/17/2009   Formatting of this note might be different from the original. 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 Hypertension   Benign paroxysmal positional vertigo 09/14/2021   Bilateral sacroiliitis (HCC) 07/22/2021   Body mass index (BMI) 25.0-25.9, adult 11/02/2020   Cancer (HCC)    skin cancer   Cardiac murmur 07/25/2021   Carotid stenosis    a. Carotid U/S 5/13: LICA < 50%, RICA 50-69%;  b.  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   Cystoid macular edema of both eyes 07/28/2020   Degenerative disc disease, lumbar 06/30/2015   Deviated nasal septum 02/18/2015   Diverticulosis 03/03/2020   DJD (degenerative joint disease) of hip 12/24/2012   Drowsiness 12/07/2022   Dry eyes 09/14/2023   Dyspnea 06/12/2011   Emphysema, unspecified (HCC) 03/05/2020   Erectile dysfunction 06/20/2017   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   Fall 09/18/2022   Gait disorder 03/02/2021   Gait instability 03/02/2021   GERD (gastroesophageal reflux disease)    GI bleed 04/21/2022   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   Qualifier: Diagnosis of  By: Gwenn Runell     Greater trochanteric pain syndrome 07/22/2021   Hand pain, left 01/16/2022   Hand weakness 05/02/2022   Hearing loss 02/02/2016   Hematochezia    Heme positive stool 02/29/2020   Hemorrhagic shock (HCC)    HIATAL HERNIA 05/13/2009   Qualifier: Diagnosis of   By: Gwenn Runell      Replacing diagnoses that were inactivated after the 12/11/22 regulatory import     High risk medication use 01/18/2012   Hip pain 03/02/2021   History of chicken pox    History of COVID-19 12/05/2021   History of hemorrhoids    History of kidney stones    History of  skin cancer 07/22/2021   skin cancer   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  Cardiology - Dr Orlean at [facility] cardiology     ICD-10 cut over     Hyperlipidemia with target LDL less than 130 12/27/2010   Formatting of this note might be different from the original.  Cardiology - Dr Orlean at [facility] cardiology  ICD-10 cut over   Hypokalemia 07/08/2021   Hypothyroidism    Internal hemorrhoids 03/03/2020   Iron  deficiency anemia 09/18/2022   Irregular heart beat 02/03/2022   Leg cramps 11/13/2019   Lower GI bleed 12/12/2012   Lumbago of lumbar region with sciatica 10/21/2020   Lumbar radiculopathy 03/02/2021   Lumbar spondylosis 11/02/2020   NEPHROLITHIASIS 05/13/2009   Qualifier: Diagnosis of  By: Gwenn Grimes     Nonspecific abnormal electrocardiogram (ECG) (EKG) 01/06/2013   Numbness of hand 01/24/2022   Onychomycosis 06/30/2015   OSA (obstructive sleep apnea) 11/25/2012   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 Osteoarthritis Of The Hand  10/1 IMO update   Osteoarthrosis, unspecified whether generalized or localized, pelvic region and thigh 07/25/2010   Formatting of this note might be different from the original. 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  User time 6 hours.  479 days , not used only 12 days  - highly compliant 06-23-13  .    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   Urinary retention 11/26/2020   Vasovagal syncope    Ventral hernia 07/08/2012   Vertigo 07/08/2021   Vitamin D  deficiency 08/07/2022   Weakness of lower extremity 10/10/2022   Weight loss 03/03/2020    Past Surgical History:  Procedure Laterality Date   ABDOMINAL HERNIA REPAIR  07/15/12   Dr Kingston   ANTERIOR CERVICAL DECOMP/DISCECTOMY FUSION N/A 07/09/2020   Procedure: ANTERIOR CERVICAL DECOMPRESSION AND FUSION CERVICAL THREE-FOUR.;  Surgeon: Onetha Arley, MD;  Location: [facility] OR;  Service: Neurosurgery;  Laterality: N/A;  anterior   BROW LIFT  05/07/01   COLONOSCOPY WITH PROPOFOL  N/A 04/24/2022   Procedure: COLONOSCOPY WITH PROPOFOL ;  Surgeon: Eda Iha, MD;  Location: [facility] ENDOSCOPY;  Service: Gastroenterology;  Laterality: N/A;   COLONOSCOPY WITH PROPOFOL  N/A 04/26/2022   Procedure: COLONOSCOPY WITH PROPOFOL ;  Surgeon: Eda Iha, MD;  Location: [facility] ENDOSCOPY;  Service: Gastroenterology;  Laterality: N/A;   CYSTOSCOPY WITH URETEROSCOPY AND STENT PLACEMENT Right 01/28/2014   Procedure: CYSTOSCOPY WITH URETEROSCOPY, BASKET RETRIVAL AND  STENT PLACEMENT;  Surgeon: Alm GORMAN Fragmin, MD;  Location: [facility] ORS;  Service: Urology;  Laterality: Right;   epidural injections     multiple procedures   ESOPHAGEAL DILATION  1993 and 1994   multiple times   EYE SURGERY Bilateral 03/25/10, 2012   cataract removal   HEMORRHOID SURGERY  2014   HIATAL HERNIA REPAIR  12/21/93   HOLMIUM LASER APPLICATION Right 01/28/2014   Procedure: HOLMIUM LASER  APPLICATION;  Surgeon: Alm GORMAN Fragmin, MD;  Location: [facility] ORS;  Service: Urology;  Laterality: Right;   INGUINAL HERNIA REPAIR Right 07/15/12   Dr Kingston, x2   JOINT REPLACEMENT  07/25/04   right knee   JOINT REPLACEMENT  07/13/10   left knee   KNEE SURGERY  1995   LAPAROSCOPIC CHOLECYSTECTOMY  09/20/06   with intraoperative cholangiogram and right inguinal herniorrhaphy with mesh    LIGAMENT REPAIR Left 07/2013   shoulder   LITHOTRIPSY     x2   LUMBAR LAMINECTOMY/DECOMPRESSION MICRODISCECTOMY Bilateral 12/27/2022   Procedure: Lumbar Two-Three, Lumbar Three-Four Sublaminar Decompression;  Surgeon: Onetha Kuba, MD;  Location: [facility] OR;  Service: Neurosurgery;  Laterality: Bilateral;   NASAL SEPTUM SURGERY  1975   POLYPECTOMY  04/26/2022   Procedure: POLYPECTOMY;  Surgeon: Eda Iha, MD;  Location: [facility] ENDOSCOPY;  Service: Gastroenterology;;   POSTERIOR CERVICAL FUSION/FORAMINOTOMY N/A 11/24/2020   Procedure: Posterior Cervical Laminectomy Cervical three-four, Cervical four-five with lateral mass fusion;  Surgeon: Onetha Kuba, MD;  Location: [facility] OR;  Service: Neurosurgery;  Laterality: N/A;   REFRACTIVE SURGERY Bilateral    Right total hip replacement  4/14   SKIN CANCER DESTRUCTION     nose, ear   TONSILLECTOMY  as child   TOTAL HIP ARTHROPLASTY Left    URETHRAL DILATION  1991   Dr. Watt    Family History  Problem Relation Age of Onset   Cancer Mother    Hypertension Other    Cancer Other    Osteoarthritis Other    Stomach cancer Neg Hx    Colon cancer Neg Hx    Esophageal cancer Neg Hx     Social History:  reports that he quit smoking about 45 years ago. His smoking use included cigarettes. He started smoking about 56 years ago. He has never used smokeless tobacco. He reports that he does not currently use alcohol . He reports that he does not use drugs.  Allergies:  Allergies  Allergen Reactions   Pravastatin  Other (See Comments)    Muscle cramps   Sildenafil  Other (See  Comments)    Tachycardia     Oxycodone  Other (See Comments)    Hallucinations    Atenolol Other (See Comments)    Bradycardia    Metoclopramide Hcl Anxiety    Prior to Admission medications   Medication Sig Start Date End Date Taking? Authorizing Provider  acetaminophen  (TYLENOL ) 500 MG tablet Take 500 mg by mouth at bedtime as needed for moderate pain (pain score 4-6).   Yes [provider]  albuterol  (  VENTOLIN  HFA) 108 (90 Base) MCG/ACT inhaler Inhale 2 puffs into the lungs every 6 (six) hours as needed for wheezing or shortness of breath. INHALE 2 PUFFS INTO THE LUNGS EVERY 6 HOURS AS NEEDED FOR SHORTNESS OF BREATH. 09/26/23  Yes O'Sullivan, Melissa, NP  allopurinol  (ZYLOPRIM ) 100 MG tablet Take 1 tablet (100 mg total) by mouth 2 (two) times daily. 11/15/23  Yes O'Sullivan, Melissa, NP  amLODipine  (NORVASC ) 2.5 MG tablet Take 1 tablet (2.5 mg total) by mouth 2 (two) times daily. 11/13/23  Yes Daryl Setter, NP  antiseptic oral rinse (BIOTENE) LIQD 15 mLs by Mouth Rinse route 2 (two) times daily.   Yes [provider]  aspirin  EC 81 MG tablet Take 81 mg by mouth at bedtime.   Yes [provider]  bacitracin  ointment Apply 1 Application topically 2 (two) times daily. 09/15/22  Yes [name], [name], DO  cholestyramine  (QUESTRAN ) 4 GM/DOSE powder Take 4 g by mouth as needed (digestion). 30 ml   Yes [provider]  diclofenac  Sodium (VOLTAREN ) 1 % GEL Apply topically. 07/21/22  Yes [provider]  fluticasone  (FLONASE ) 50 MCG/ACT nasal spray Place 1 spray into both nostrils at bedtime.   Yes [provider]  gabapentin  (NEURONTIN ) 300 MG capsule Take 1 capsule (300 mg total) by mouth at bedtime. 07/17/23  Yes Daryl Setter, NP  glucose blood test strip TRUE Metrix Blood Glucose Test Strip, use as instructed 11/12/20  Yes O'Sullivan, Melissa, NP  hydrALAZINE  (APRESOLINE ) 50 MG tablet Take 1 tablet (50 mg total) by mouth 3 (three) times  daily. 10/10/23  Yes O'Sullivan, Melissa, NP  hydrochlorothiazide  (HYDRODIURIL ) 25 MG tablet Take 1 tablet (25 mg total) by mouth daily. 03/10/24  Yes Daryl Setter, NP  Iron , Ferrous Sulfate , 325 (65 Fe) MG TABS Take 325 mg by mouth every other day. 08/10/22  Yes Daryl Setter, NP  Lancets MISC 1 each by Does not apply route 2 (two) times daily. TRUE matrix lancets 11/12/20  Yes Daryl Setter, NP  levothyroxine  (SYNTHROID ) 112 MCG tablet TAKE 1 TABLET BY MOUTH ONCE DAILY BEFORE BREAKFAST TAKE  WITH  ADDITIONAL  TO  50  MCG  TABLETS 01/24/24  Yes Daryl Setter, NP  levothyroxine  (SYNTHROID ) 50 MCG tablet TAKE 1 TABLET BY MOUTH BEFORE BREAKFAST TAKE  WITH  ADDITIONAL  TO  112  MCG  TABLETS. 01/21/24  Yes O'Sullivan, Melissa, NP  loratadine  (CLARITIN ) 10 MG tablet Take 10 mg by mouth daily as needed for allergies or rhinitis.   Yes [provider]  Magnesium  500 MG TABS Take 500 mg by mouth daily.   Yes [provider]  Menthol , Topical Analgesic, (ICY HOT EX) Apply 1 Application topically at bedtime. Lidocaine  / on back   Yes [provider]  metroNIDAZOLE  (METROCREAM ) 0.75 % cream Apply 1 Application topically daily as needed (head).   Yes [provider]  multivitamin-lutein  (OCUVITE-LUTEIN ) CAPS capsule Take 1 capsule by mouth daily.   Yes [provider]  mupirocin  ointment (BACTROBAN ) 2 % Apply 1 Application topically 2 (two) times daily. 03/12/24  Yes Sikora, Rebecca, DPM  nitroGLYCERIN  (NITROSTAT ) 0.4 MG SL tablet Place 1 tablet (0.4 mg total) under the tongue every 5 (five) minutes as needed for chest pain. 09/26/23  Yes Daryl Setter, NP  OVER THE COUNTER MEDICATION Place 1 spray into both nostrils 2 (two) times daily. Sinus saline   Yes [provider]  OVER THE COUNTER MEDICATION Take 15 mLs by mouth at bedtime. Yellow Mustard  Yes [provider]  polyethylene glycol powder (GLYCOLAX /MIRALAX ) 17 GM/SCOOP  powder Take 17 g by mouth 2 (two) times daily as needed for mild constipation, moderate constipation or severe constipation. 12/30/22  Yes Geiple, Joshua, PA-C  polyvinyl alcohol  (ARTIFICIAL TEARS) 1.4 % ophthalmic solution Place 1 drop into both eyes daily as needed for dry eyes.   Yes [provider]  PREBIOTIC PRODUCT PO Take 1 tablet by mouth daily.   Yes [provider]  rosuvastatin  (CRESTOR ) 5 MG tablet Take 1 tablet (5 mg total) by mouth daily. 02/21/24  Yes Daryl Setter, NP  senna-docusate (SENOKOT-S) 8.6-50 MG tablet Take 2 tablets by mouth 2 (two) times daily. 09/17/23  Yes Krishnan, Gokul, MD  valsartan  (DIOVAN ) 320 MG tablet Take 1 tablet (320 mg total) by mouth daily. 10/24/23  Yes Daryl Setter, NP  vitamin B-12 (CYANOCOBALAMIN ) 500 MCG tablet Take 500 mcg by mouth in the morning.   Yes [provider]    Blood pressure (!) 161/72, pulse 64, height 5' 6 (1.676 m), weight 135 lb (61.2 kg), SpO2 95%. Exam: General: Communicates without difficulty, well nourished, no acute distress. Head: Normocephalic, no evidence injury, no tenderness, facial buttresses intact without stepoff. Face/sinus: No tenderness to palpation and percussion. Facial movement is normal and symmetric. Eyes: PERRL, EOMI. No scleral icterus, conjunctivae clear. Neuro: CN II exam reveals vision grossly intact.  No nystagmus at any point of gaze. Ears: Auricles well formed without lesions.  Ear canals are intact without mass or lesion.  No erythema or edema is appreciated.  The TMs are intact without fluid. Nose: External evaluation reveals normal support and skin without lesions.  Dorsum is intact.  Anterior rhinoscopy reveals congested mucosa over anterior aspect of inferior turbinates and severely deviated septum.  No purulence noted. Oral:  Oral cavity and oropharynx are intact, symmetric, without erythema or edema.  Mucosa is moist without lesions. Neck: Full range of motion without  pain.  There is no significant lymphadenopathy.  No masses palpable.  Thyroid  bed within normal limits to palpation.  Parotid glands and submandibular glands equal bilaterally without mass.  Trachea is midline. Neuro:  CN 2-12 grossly intact.   Procedure:  Flexible Nasal Endoscopy: Description: Risks, benefits, and alternatives of flexible endoscopy were explained to the patient.  Specific mention was made of the risk of throat numbness with difficulty swallowing, possible bleeding from the nose and mouth, and pain from the procedure.  The patient gave oral consent to proceed.  The flexible scope was inserted into the right nasal cavity.  Endoscopy of the interior nasal cavity, superior, inferior, and middle meatus was performed. The sphenoid-ethmoid recess was examined. Edematous mucosa was noted.  No polyp, mass, or lesion was appreciated. Nasal septal deviation noted. Olfactory cleft was clear.  Nasopharynx was clear.  Turbinates were hypertrophied but without mass.  The procedure was repeated on the contralateral side with similar findings.  The patient tolerated the procedure well.   Assessment: 1.  Chronic rhinitis with nasal mucosal congestion, severe nasal septal deviation, and bilateral inferior turbinate hypertrophy.  Borderline 99% of his nasal passageways are obstructed bilaterally. 2.  Chronic postnasal drainage. 3.  The patient has not responded to medical treatment for the past 5 years.  He was previously treated with Flonase  nasal spray and nasal saline irrigation.  Plan: 1.  The physical exam and nasal endoscopy findings are reviewed with the patient. 2.  Continue with Flonase  nasal spray 2 sprays each nostril daily. 3.  Based on the above findings, the patient will benefit from surgical intervention with septoplasty and bilateral inferior turbinate reduction.  The risk, benefits, alternatives, and details of the procedures are extensively discussed.  Questions are invited and  answered. 4.  The patient would like to proceed with the procedures.  We will schedule the procedures in accordance with the patient's schedule.  Presten Joost W Donnae Michels 03/19/2024, 10:33 AM

## 2024-03-20 DIAGNOSIS — R350 Frequency of micturition: Secondary | ICD-10-CM | POA: Diagnosis not present

## 2024-03-20 DIAGNOSIS — N3941 Urge incontinence: Secondary | ICD-10-CM | POA: Diagnosis not present

## 2024-03-25 ENCOUNTER — Encounter (HOSPITAL_BASED_OUTPATIENT_CLINIC_OR_DEPARTMENT_OTHER): Payer: Self-pay | Admitting: Otolaryngology

## 2024-03-25 ENCOUNTER — Other Ambulatory Visit: Payer: Self-pay

## 2024-03-26 ENCOUNTER — Encounter (HOSPITAL_BASED_OUTPATIENT_CLINIC_OR_DEPARTMENT_OTHER)
Admission: RE | Admit: 2024-03-26 | Discharge: 2024-03-26 | Disposition: A | Discharge status: Home / Self Care | Source: Ambulatory Visit | Attending: Otolaryngology | Admitting: Otolaryngology

## 2024-03-26 DIAGNOSIS — Z01818 Encounter for other preprocedural examination: Secondary | ICD-10-CM | POA: Insufficient documentation

## 2024-03-26 LAB — BASIC METABOLIC PANEL WITH GFR
Anion gap: 12 (ref 5–15)
BUN: 27 mg/dL — ABNORMAL HIGH (ref 8–23)
CO2: 23 mmol/L (ref 22–32)
Calcium: 9.2 mg/dL (ref 8.9–10.3)
Chloride: 104 mmol/L (ref 98–111)
Creatinine, Ser: 1.4 mg/dL — ABNORMAL HIGH (ref 0.61–1.24)
GFR, Estimated: 50 mL/min — ABNORMAL LOW (ref >60)
Glucose, Bld: 143 mg/dL — ABNORMAL HIGH (ref 70–99)
Potassium: 3.7 mmol/L (ref 3.5–5.1)
Sodium: 139 mmol/L (ref 135–145)

## 2024-03-27 NOTE — Anesthesia Preprocedure Evaluation (Addendum)
 Anesthesia Evaluation  Patient identified by MRN, date of birth, ID band Patient awake    Reviewed: Allergy & Precautions, NPO status , Patient's Chart, lab work & pertinent test results  History of Anesthesia Complications (+) history of anesthetic complications (slow bowel return)  Airway Mallampati: I  TM Distance: >3 FB Neck ROM: Limited   Comment: Previous grade I view with Glidescope 4, easy mask Dental  (+) Dental Advisory Given   Pulmonary shortness of breath and with exertion, sleep apnea and Continuous Positive Airway Pressure Ventilation , pneumonia (09/2023), resolved, COPD (no recent inhaler use),  COPD inhaler, neg recent URI, former smoker   Pulmonary exam normal breath sounds clear to auscultation       Cardiovascular hypertension (amlodipine , hydralazine , HCTZ, valsartan ), Pt. on medications (-) angina (-) Past MI, (-) Cardiac Stents and (-) CABG + dysrhythmias (palpitations, 1st degree AV block, SVT, RBBB) + Valvular Problems/Murmurs (mild) MR  Rhythm:Regular Rate:Normal  HLD, carotid artery stenosis  TTE 08/24/2021: IMPRESSIONS    1. Left ventricular ejection fraction, by estimation, is 55 to 60%. The  left ventricle has normal function. The left ventricle has no regional  wall motion abnormalities. There is mild concentric left ventricular  hypertrophy. Left ventricular diastolic  parameters are consistent with Grade I diastolic dysfunction (impaired  relaxation).   2. Right ventricular systolic function is normal. The right ventricular  size is normal.   3. The mitral valve is degenerative. Mild mitral valve regurgitation. No  evidence of mitral stenosis.   4. The aortic valve is tricuspid. Aortic valve regurgitation is not  visualized. Aortic valve sclerosis is present, with no evidence of aortic  valve stenosis.   5. The inferior vena cava is normal in size with greater than 50%  respiratory variability,  suggesting right atrial pressure of 3 mmHg.   Low-risk stress test 08/02/2021    Neuro/Psych neg Seizures PSYCHIATRIC DISORDERS  Depression    BPPV, RLS  Neuromuscular disease (cervical myelopathy s/p fusion, lumbar radiculopathy and stenosis s/p laminectomy/decompression, ulnar neuropathy, cranial nerve IV palsy)    GI/Hepatic Neg liver ROS, hiatal hernia (s/p repair),GERD  ,,Diverticulosis    Endo/Other  diabetesHypothyroidism    Renal/GU CRFRenal disease     Musculoskeletal  (+) Arthritis , Osteoarthritis,    Abdominal   Peds  Hematology  (+) Blood dyscrasia, anemia Lab Results      Component                Value               Date                      WBC                      9.3                 02/01/2024                HGB                      11.7 (L)            02/01/2024                HCT                      35.6 (L)  02/01/2024                MCV                      97.3                02/01/2024                PLT                      341                 02/01/2024              Anesthesia Other Findings   Reproductive/Obstetrics                              Anesthesia Physical Anesthesia Plan  ASA: 3  Anesthesia Plan: General   Post-op Pain Management: Tylenol  PO (pre-op)*   Induction: Intravenous  PONV Risk Score and Plan: 2 and Ondansetron , Dexamethasone  and Treatment may vary due to age or medical condition  Airway Management Planned: Oral ETT  Additional Equipment:   Intra-op Plan:   Post-operative Plan: Extubation in OR  Informed Consent: I have reviewed the patients History and Physical, chart, labs and discussed the procedure including the risks, benefits and alternatives for the proposed anesthesia with the patient or authorized representative who has indicated his/her understanding and acceptance.     Dental advisory given  Plan Discussed with: CRNA and Anesthesiologist  Anesthesia Plan  Comments: (Risks of general anesthesia discussed including, but not limited to, sore throat, hoarse voice, chipped/damaged teeth, injury to vocal cords, nausea and vomiting, allergic reactions, lung infection, heart attack, stroke, and death. All questions answered. )         Anesthesia Quick Evaluation

## 2024-03-31 ENCOUNTER — Encounter (HOSPITAL_BASED_OUTPATIENT_CLINIC_OR_DEPARTMENT_OTHER): Payer: Self-pay | Admitting: Otolaryngology

## 2024-03-31 ENCOUNTER — Ambulatory Visit (HOSPITAL_BASED_OUTPATIENT_CLINIC_OR_DEPARTMENT_OTHER): Payer: Self-pay | Admitting: Anesthesiology

## 2024-03-31 ENCOUNTER — Ambulatory Visit (HOSPITAL_BASED_OUTPATIENT_CLINIC_OR_DEPARTMENT_OTHER)
Admission: RE | Admit: 2024-03-31 | Discharge: 2024-03-31 | Disposition: A | Discharge status: Home / Self Care | Attending: Otolaryngology | Admitting: Otolaryngology

## 2024-03-31 ENCOUNTER — Other Ambulatory Visit (HOSPITAL_BASED_OUTPATIENT_CLINIC_OR_DEPARTMENT_OTHER)

## 2024-03-31 ENCOUNTER — Encounter (HOSPITAL_BASED_OUTPATIENT_CLINIC_OR_DEPARTMENT_OTHER)
Admission: RE | Disposition: A | Discharge status: Home / Self Care | Payer: Self-pay | Source: Home / Self Care | Attending: Otolaryngology

## 2024-03-31 DIAGNOSIS — J342 Deviated nasal septum: Secondary | ICD-10-CM | POA: Insufficient documentation

## 2024-03-31 DIAGNOSIS — Z87891 Personal history of nicotine dependence: Secondary | ICD-10-CM | POA: Diagnosis not present

## 2024-03-31 DIAGNOSIS — R0981 Nasal congestion: Secondary | ICD-10-CM | POA: Diagnosis present

## 2024-03-31 DIAGNOSIS — I440 Atrioventricular block, first degree: Secondary | ICD-10-CM | POA: Insufficient documentation

## 2024-03-31 DIAGNOSIS — N189 Chronic kidney disease, unspecified: Secondary | ICD-10-CM | POA: Insufficient documentation

## 2024-03-31 DIAGNOSIS — D649 Anemia, unspecified: Secondary | ICD-10-CM | POA: Diagnosis not present

## 2024-03-31 DIAGNOSIS — I471 Supraventricular tachycardia, unspecified: Secondary | ICD-10-CM | POA: Insufficient documentation

## 2024-03-31 DIAGNOSIS — I129 Hypertensive chronic kidney disease with stage 1 through stage 4 chronic kidney disease, or unspecified chronic kidney disease: Secondary | ICD-10-CM | POA: Insufficient documentation

## 2024-03-31 DIAGNOSIS — J3489 Other specified disorders of nose and nasal sinuses: Secondary | ICD-10-CM | POA: Diagnosis not present

## 2024-03-31 DIAGNOSIS — I4510 Unspecified right bundle-branch block: Secondary | ICD-10-CM | POA: Insufficient documentation

## 2024-03-31 DIAGNOSIS — J449 Chronic obstructive pulmonary disease, unspecified: Secondary | ICD-10-CM | POA: Insufficient documentation

## 2024-03-31 DIAGNOSIS — D759 Disease of blood and blood-forming organs, unspecified: Secondary | ICD-10-CM | POA: Insufficient documentation

## 2024-03-31 DIAGNOSIS — N1832 Chronic kidney disease, stage 3b: Secondary | ICD-10-CM | POA: Diagnosis not present

## 2024-03-31 DIAGNOSIS — G4733 Obstructive sleep apnea (adult) (pediatric): Secondary | ICD-10-CM | POA: Insufficient documentation

## 2024-03-31 DIAGNOSIS — J343 Hypertrophy of nasal turbinates: Secondary | ICD-10-CM

## 2024-03-31 DIAGNOSIS — E1122 Type 2 diabetes mellitus with diabetic chronic kidney disease: Secondary | ICD-10-CM | POA: Insufficient documentation

## 2024-03-31 DIAGNOSIS — I10 Essential (primary) hypertension: Secondary | ICD-10-CM

## 2024-03-31 DIAGNOSIS — I340 Nonrheumatic mitral (valve) insufficiency: Secondary | ICD-10-CM | POA: Diagnosis not present

## 2024-03-31 DIAGNOSIS — E039 Hypothyroidism, unspecified: Secondary | ICD-10-CM | POA: Diagnosis not present

## 2024-03-31 HISTORY — PX: NASAL SEPTOPLASTY W/ TURBINOPLASTY: SHX2070

## 2024-03-31 SURGERY — SEPTOPLASTY, NOSE, WITH NASAL TURBINATE REDUCTION
Anesthesia: General | Site: Nose | Laterality: Bilateral

## 2024-03-31 MED ORDER — MUPIROCIN 2 % EX OINT
TOPICAL_OINTMENT | CUTANEOUS | Status: AC
  Filled 2024-03-31: qty 22

## 2024-03-31 MED ORDER — EPHEDRINE SULFATE (PRESSORS) 50 MG/ML IJ SOLN
INTRAMUSCULAR | Status: DC | PRN
  Administered 2024-03-31: 5 mg via INTRAVENOUS
  Administered 2024-03-31: 10 mg via INTRAVENOUS
  Administered 2024-03-31: 5 mg via INTRAVENOUS

## 2024-03-31 MED ORDER — CEFAZOLIN SODIUM-DEXTROSE 2-4 GM/100ML-% IV SOLN
INTRAVENOUS | Status: AC
  Filled 2024-03-31: qty 100

## 2024-03-31 MED ORDER — ACETAMINOPHEN 500 MG PO TABS
1000.0000 mg | ORAL_TABLET | Freq: Once | ORAL | Status: AC
  Administered 2024-03-31: 1000 mg via ORAL

## 2024-03-31 MED ORDER — FENTANYL CITRATE (PF) 100 MCG/2ML IJ SOLN
INTRAMUSCULAR | Status: DC | PRN
  Administered 2024-03-31: 50 ug via INTRAVENOUS

## 2024-03-31 MED ORDER — ONDANSETRON HCL 4 MG/2ML IJ SOLN
INTRAMUSCULAR | Status: AC
  Filled 2024-03-31: qty 2

## 2024-03-31 MED ORDER — AMISULPRIDE (ANTIEMETIC) 5 MG/2ML IV SOLN: 10.0000 mg | Freq: Once | INTRAVENOUS | Status: DC | PRN

## 2024-03-31 MED ORDER — OXYMETAZOLINE HCL 0.05 % NA SOLN
NASAL | Status: AC
  Filled 2024-03-31: qty 30

## 2024-03-31 MED ORDER — LIDOCAINE HCL (CARDIAC) PF 100 MG/5ML IV SOSY
PREFILLED_SYRINGE | INTRAVENOUS | Status: DC | PRN
  Administered 2024-03-31: 100 mg via INTRAVENOUS

## 2024-03-31 MED ORDER — PROPOFOL 10 MG/ML IV BOLUS
INTRAVENOUS | Status: DC | PRN
  Administered 2024-03-31: 100 mg via INTRAVENOUS

## 2024-03-31 MED ORDER — OXYMETAZOLINE HCL 0.05 % NA SOLN
NASAL | Status: DC | PRN
  Administered 2024-03-31: 1 via TOPICAL

## 2024-03-31 MED ORDER — ONDANSETRON HCL 4 MG/2ML IJ SOLN
INTRAMUSCULAR | Status: DC | PRN
  Administered 2024-03-31: 4 mg via INTRAVENOUS

## 2024-03-31 MED ORDER — LIDOCAINE-EPINEPHRINE 1 %-1:100000 IJ SOLN
INTRAMUSCULAR | Status: DC | PRN
  Administered 2024-03-31: 4 mL

## 2024-03-31 MED ORDER — PHENYLEPHRINE HCL (PRESSORS) 10 MG/ML IV SOLN
INTRAVENOUS | Status: DC | PRN
  Administered 2024-03-31 (×3): 80 ug via INTRAVENOUS

## 2024-03-31 MED ORDER — LACTATED RINGERS IV SOLN: INTRAVENOUS | Status: DC

## 2024-03-31 MED ORDER — DEXAMETHASONE SODIUM PHOSPHATE 4 MG/ML IJ SOLN
INTRAMUSCULAR | Status: DC | PRN
  Administered 2024-03-31: 4 mg via INTRAVENOUS

## 2024-03-31 MED ORDER — FENTANYL CITRATE (PF) 100 MCG/2ML IJ SOLN
INTRAMUSCULAR | Status: AC
  Filled 2024-03-31: qty 2

## 2024-03-31 MED ORDER — DEXAMETHASONE SODIUM PHOSPHATE 10 MG/ML IJ SOLN
INTRAMUSCULAR | Status: AC
  Filled 2024-03-31: qty 1

## 2024-03-31 MED ORDER — ACETAMINOPHEN 500 MG PO TABS
ORAL_TABLET | ORAL | Status: AC
  Filled 2024-03-31: qty 2

## 2024-03-31 MED ORDER — MUPIROCIN 2 % EX OINT
TOPICAL_OINTMENT | CUTANEOUS | Status: DC | PRN
  Administered 2024-03-31: 1 via TOPICAL

## 2024-03-31 MED ORDER — SUGAMMADEX SODIUM 200 MG/2ML IV SOLN
INTRAVENOUS | Status: DC | PRN
Start: 2024-03-31 — End: 2024-03-31
  Administered 2024-03-31: 200 mg via INTRAVENOUS

## 2024-03-31 MED ORDER — LIDOCAINE-EPINEPHRINE 1 %-1:100000 IJ SOLN
INTRAMUSCULAR | Status: AC
  Filled 2024-03-31: qty 1

## 2024-03-31 MED ORDER — FENTANYL CITRATE (PF) 100 MCG/2ML IJ SOLN: 25.0000 ug | INTRAMUSCULAR | Status: DC | PRN

## 2024-03-31 MED ORDER — ROCURONIUM BROMIDE 100 MG/10ML IV SOLN
INTRAVENOUS | Status: DC | PRN
  Administered 2024-03-31: 50 mg via INTRAVENOUS

## 2024-03-31 SURGICAL SUPPLY — 28 items
CANISTER SUCT 1200ML W/VALVE (MISCELLANEOUS) ×1 IMPLANT
COAGULATOR SUCT 8FR VV (MISCELLANEOUS) ×1 IMPLANT
DEFOGGER MIRROR 1QT (MISCELLANEOUS) ×1 IMPLANT
DRSG NASOPORE 8CM (GAUZE/BANDAGES/DRESSINGS) IMPLANT
DRSG TELFA 3X8 NADH STRL (GAUZE/BANDAGES/DRESSINGS) IMPLANT
ELECTRODE REM PT RTRN 9FT ADLT (ELECTROSURGICAL) ×1 IMPLANT
GAUZE SPONGE 2X2 STRL 8-PLY (GAUZE/BANDAGES/DRESSINGS) ×1 IMPLANT
GLOVE BIO SURGEON STRL SZ7.5 (GLOVE) ×1 IMPLANT
GLOVE SURG SS PI 6.5 STRL IVOR (GLOVE) IMPLANT
GOWN STRL REUS W/ TWL LRG LVL3 (GOWN DISPOSABLE) ×2 IMPLANT
NDL HYPO 25X1 1.5 SAFETY (NEEDLE) ×1 IMPLANT
NEEDLE HYPO 25X1 1.5 SAFETY (NEEDLE) ×1 IMPLANT
NS IRRIG 1000ML POUR BTL (IV SOLUTION) ×1 IMPLANT
PACK BASIN DAY SURGERY FS (CUSTOM PROCEDURE TRAY) ×1 IMPLANT
PACK ENT DAY SURGERY (CUSTOM PROCEDURE TRAY) ×1 IMPLANT
PAD MAGNETIC INSTR ST 16X20 (MISCELLANEOUS) IMPLANT
SLEEVE SCD COMPRESS KNEE MED (STOCKING) IMPLANT
SPIKE FLUID TRANSFER (MISCELLANEOUS) IMPLANT
SPLINT NASAL AIRWAY SILICONE (MISCELLANEOUS) ×1 IMPLANT
SPONGE NEURO XRAY DETECT 1X3 (DISPOSABLE) ×1 IMPLANT
SUT CHROMIC 4 0 P 3 18 (SUTURE) ×1 IMPLANT
SUT PLAIN 4 0 ~~LOC~~ 1 (SUTURE) ×1 IMPLANT
SUT PROLENE 3 0 PS 2 (SUTURE) ×1 IMPLANT
SUT VIC AB 4-0 P-3 18XBRD (SUTURE) IMPLANT
TOWEL GREEN STERILE FF (TOWEL DISPOSABLE) ×1 IMPLANT
TUBE SALEM SUMP 12FR 48 (TUBING) IMPLANT
TUBE SALEM SUMP 16F (TUBING) ×1 IMPLANT
YANKAUER SUCT BULB TIP NO VENT (SUCTIONS) ×1 IMPLANT

## 2024-03-31 NOTE — Transfer of Care (Signed)
 Immediate Anesthesia Transfer of Care Note  Patient: [name] Pratt  Procedure(s) Performed: SEPTOPLASTY, NOSE, WITH NASAL TURBINATE REDUCTION (Bilateral: Nose)  Patient Location: PACU  Anesthesia Type:General  Level of Consciousness: sedated  Airway & Oxygen Therapy: Patient Spontanous Breathing and Patient connected to face mask oxygen  Post-op Assessment: Report given to RN and Post -op Vital signs reviewed and stable  Post vital signs: Reviewed and stable  Last Vitals:  Vitals Value Taken Time  BP 138/64 03/31/24 10:37  Temp 36.2 C 03/31/24 10:37  Pulse 67 03/31/24 10:41  Resp 15 03/31/24 10:41  SpO2 99 % 03/31/24 10:41  Vitals shown include unfiled device data.  Last Pain:  Vitals:   03/31/24 1037  TempSrc:   PainSc: Asleep      Patients Stated Pain Goal: 3 (03/31/24 0759)  Complications: No notable events documented.

## 2024-03-31 NOTE — Anesthesia Procedure Notes (Signed)
 Procedure Name: Intubation Date/Time: 03/31/2024 8:33 AM  Performed by: Julieanne Fairy BROCKS, CRNAPre-anesthesia Checklist: Patient identified, Emergency Drugs available, Suction available and Patient being monitored Patient Re-evaluated:Patient Re-evaluated prior to induction Oxygen Delivery Method: Circle system utilized Preoxygenation: Pre-oxygenation with 100% oxygen Induction Type: IV induction Ventilation: Mask ventilation without difficulty Laryngoscope Size: Mac and 3 Grade View: Grade I Tube type: Oral Rae Tube size: 8.0 mm Number of attempts: 1 Airway Equipment and Method: Stylet and Oral airway Placement Confirmation: ETT inserted through vocal cords under direct vision, positive ETCO2 and breath sounds checked- equal and bilateral Secured at: 21 cm Tube secured with: Tape Dental Injury: Teeth and Oropharynx as per pre-operative assessment

## 2024-03-31 NOTE — H&P (Signed)
 CC: Chronic nasal obstruction, chronic postnasal drainage   HPI:  [name] Pratt is an 83 y.o. male who presents today complaining of chronic nasal obstruction and chronic postnasal drainage.  According to the patient, he has been symptomatic for more than 5 years.  The nasal obstruction has caused difficulty using his CPAP machine.  He previously underwent nasal surgery more than 20 years ago, with temporary improvement in his nasal breathing.  He has a history of environmental allergies.  He was treated with Flonase  nasal spray and nasal saline irrigation, but without significant improvement in his breathing.  He denies any recent sinusitis.       Past Medical History:  Diagnosis Date   Acute kidney injury (HCC) 05/02/2022   Acute metabolic encephalopathy 04/24/2022   Acute respiratory failure with hypoxia (HCC) 09/14/2023   Adenomatous polyp of ascending colon     Adenomatous polyp of colon     Allergic rhinitis 02/02/2016   Anemia 12/17/2009    Formatting of this note might be different from the original. 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 Hypertension   Benign paroxysmal positional vertigo 09/14/2021   Bilateral sacroiliitis (HCC) 07/22/2021   Body mass index (BMI) 25.0-25.9, adult 11/02/2020   Cancer (HCC)      skin cancer   Cardiac murmur 07/25/2021   Carotid stenosis      a. Carotid U/S 5/13: LICA < 50%, RICA 50-69%;  b.  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   Cystoid macular edema of both eyes 07/28/2020   Degenerative disc disease, lumbar 06/30/2015   Deviated nasal septum 02/18/2015   Diverticulosis 03/03/2020   DJD (degenerative joint disease) of hip 12/24/2012   Drowsiness 12/07/2022   Dry eyes 09/14/2023   Dyspnea 06/12/2011   Emphysema, unspecified (HCC) 03/05/2020   Erectile dysfunction 06/20/2017   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   Fall 09/18/2022   Gait disorder 03/02/2021   Gait instability 03/02/2021   GERD (gastroesophageal reflux disease)     GI bleed 04/21/2022   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    Qualifier: Diagnosis of  By: Gwenn Runell     Greater trochanteric pain syndrome 07/22/2021   Hand pain, left 01/16/2022   Hand weakness 05/02/2022   Hearing loss 02/02/2016   Hematochezia     Heme positive stool 02/29/2020   Hemorrhagic shock (HCC)     HIATAL HERNIA 05/13/2009    Qualifier: Diagnosis of   By: Gwenn Runell      Replacing diagnoses that were inactivated after the 12/11/22 regulatory import     High risk medication use 01/18/2012   Hip pain 03/02/2021   History of chicken pox  History of COVID-19 12/05/2021   History of hemorrhoids     History of kidney stones     History of skin cancer 07/22/2021    skin cancer   History of total bilateral knee replacement 07/08/2020   HTN (hypertension) 05/13/2009             Hyperglycemia 03/03/2014   Hyperlipidemia 12/27/2010    Formatting of this note might be different from the original.  Cardiology - Dr Orlean at [facility] cardiology     ICD-10 cut over     Hyperlipidemia with target LDL less than 130 12/27/2010    Formatting of this note might be  different from the original. Cardiology - Dr Orlean at [facility] cardiology  ICD-10 cut over   Hypokalemia 07/08/2021   Hypothyroidism     Internal hemorrhoids 03/03/2020   Iron  deficiency anemia 09/18/2022   Irregular heart beat 02/03/2022   Leg cramps 11/13/2019   Lower GI bleed 12/12/2012   Lumbago of lumbar region with sciatica 10/21/2020   Lumbar radiculopathy 03/02/2021   Lumbar spondylosis 11/02/2020   NEPHROLITHIASIS 05/13/2009    Qualifier: Diagnosis of  By: Gwenn Grimes     Nonspecific abnormal electrocardiogram (ECG) (EKG) 01/06/2013   Numbness of hand 01/24/2022   Onychomycosis 06/30/2015   OSA (obstructive sleep apnea) 11/25/2012   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    IMO SNOMED Dx Update Oct 2024     Osteoarthrosis, hand 06/13/2010    Formatting of this note might be different from the original. Osteoarthritis Of The Hand  10/1 IMO update   Osteoarthrosis, unspecified whether generalized or localized, pelvic region and thigh 07/25/2010    Formatting of this note might be different from the original. 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  User time 6  hours.  479 days , not used only 12 days - highly compliant 06-23-13  .    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   Urinary retention 11/26/2020   Vasovagal syncope     Ventral hernia 07/08/2012   Vertigo 07/08/2021   Vitamin D  deficiency 08/07/2022   Weakness of lower extremity 10/10/2022   Weight loss 03/03/2020               Past Surgical History:  Procedure Laterality Date   ABDOMINAL HERNIA REPAIR   07/15/12    Dr Kingston   ANTERIOR CERVICAL DECOMP/DISCECTOMY FUSION N/A 07/09/2020    Procedure: ANTERIOR CERVICAL DECOMPRESSION AND FUSION CERVICAL THREE-FOUR.;  Surgeon: Onetha Kuba, MD;  Location[facility] OR;  Service: Neurosurgery;  Laterality: N/A;  anterior   BROW LIFT   05/07/01   COLONOSCOPY WITH PROPOFOL  N/A 04/24/2022    Procedure: COLONOSCOPY WITH PROPOFOL ;  Surgeon: Eda Iha, MD;  Location: [facility] ENDOSCOPY;  Service: Gastroenterology;  Laterality: N/A;   COLONOSCOPY WITH PROPOFOL  N/A 04/26/2022    Procedure: COLONOSCOPY WITH PROPOFOL ;  Surgeon: Eda Iha, MD;  Location: [facility] ENDOSCOPY;  Service: Gastroenterology;  Laterality: N/A;   CYSTOSCOPY WITH URETEROSCOPY AND STENT PLACEMENT Right 01/28/2014    Procedure: CYSTOSCOPY WITH URETEROSCOPY, BASKET RETRIVAL AND  STENT PLACEMENT;  Surgeon: Alm GORMAN Fragmin, MD;  Location: [facility] ORS;  Service: Urology;  Laterality: Right;   epidural injections        multiple procedures   ESOPHAGEAL DILATION   1993 and 1994    multiple times   EYE SURGERY Bilateral 03/25/10, 2012    cataract removal   HEMORRHOID SURGERY   2014   HIATAL HERNIA REPAIR   12/21/93    HOLMIUM LASER APPLICATION Right 01/28/2014    Procedure: HOLMIUM LASER APPLICATION;  Surgeon: Alm GORMAN Fragmin, MD;  Location: [facility] ORS;  Service: Urology;  Laterality: Right;   INGUINAL HERNIA REPAIR Right 07/15/12    Dr Kingston, x2   JOINT REPLACEMENT   07/25/04    right knee   JOINT REPLACEMENT   07/13/10    left knee   KNEE SURGERY   1995   LAPAROSCOPIC CHOLECYSTECTOMY   09/20/06    with intraoperative cholangiogram and right inguinal herniorrhaphy with mesh    LIGAMENT REPAIR Left 07/2013    shoulder   LITHOTRIPSY        x2   LUMBAR LAMINECTOMY/DECOMPRESSION MICRODISCECTOMY Bilateral 12/27/2022    Procedure: Lumbar Two-Three, Lumbar Three-Four Sublaminar Decompression;  Surgeon: Onetha Kuba, MD;  Location: [facility] OR;  Service: Neurosurgery;  Laterality: Bilateral;   NASAL SEPTUM SURGERY   1975   POLYPECTOMY   04/26/2022    Procedure: POLYPECTOMY;  Surgeon: Eda Iha, MD;  Location: [facility] ENDOSCOPY;  Service: Gastroenterology;;   POSTERIOR CERVICAL FUSION/FORAMINOTOMY N/A 11/24/2020    Procedure: Posterior Cervical Laminectomy Cervical three-four, Cervical four-five with lateral mass fusion;  Surgeon: Onetha Kuba, MD;  Location: [facility] OR;  Service: Neurosurgery;  Laterality: N/A;   REFRACTIVE SURGERY Bilateral     Right total hip replacement   4/14   SKIN CANCER DESTRUCTION        nose, ear   TONSILLECTOMY   as child   TOTAL HIP ARTHROPLASTY Left     URETHRAL DILATION   1991    Dr. Watt               Family History  Problem Relation Age of Onset   Cancer Mother     Hypertension Other     Cancer Other     Osteoarthritis Other     Stomach cancer Neg Hx     Colon cancer Neg Hx     Esophageal cancer Neg Hx            Social History:  reports that he quit smoking about 45 years ago. His smoking use included cigarettes. He started smoking about 56 years ago. He has never used smokeless tobacco. He reports that he does not currently use  alcohol . He reports that he does not use drugs.    Allergies:  Allergies       Allergies  Allergen Reactions   Pravastatin  Other (See Comments)      Muscle cramps   Sildenafil  Other (See Comments)      Tachycardia       Oxycodone  Other (See Comments)      Hallucinations     Atenolol Other (See Comments)      Bradycardia     Metoclopramide Hcl Anxiety               Prior to Admission medications   Medication Sig Start Date End Date Taking? Authorizing Provider  acetaminophen  (TYLENOL ) 500 MG tablet Take 500 mg by mouth at bedtime as needed for moderate pain (pain score 4-6).     Yes [provider]  albuterol  (VENTOLIN  HFA) 108 (90 Base) MCG/ACT inhaler Inhale 2 puffs into the lungs every 6 (six) hours as needed for wheezing or shortness of breath. INHALE 2 PUFFS INTO THE LUNGS EVERY 6 HOURS AS NEEDED FOR SHORTNESS OF BREATH. 09/26/23   Yes O'Sullivan, Melissa, NP  allopurinol  (ZYLOPRIM ) 100 MG tablet Take 1 tablet (100 mg total) by mouth 2 (two) times daily. 11/15/23   Yes Daryl Setter, NP  amLODipine  (NORVASC ) 2.5 MG tablet Take 1 tablet (2.5 mg total) by mouth 2 (two) times daily. 11/13/23   Yes Daryl Setter, NP  antiseptic oral rinse (BIOTENE) LIQD 15 mLs by Mouth Rinse route 2 (two) times daily.     Yes [provider]  aspirin  EC 81 MG tablet Take 81 mg by mouth at bedtime.     Yes [provider]  bacitracin  ointment Apply 1 Application topically 2 (two) times daily. 09/15/22   Yes [name], [name], DO  cholestyramine  (QUESTRAN ) 4 GM/DOSE powder Take 4 g by mouth as needed (digestion). 30 ml     Yes [provider]  diclofenac  Sodium (VOLTAREN ) 1 % GEL Apply topically. 07/21/22   Yes [provider]  fluticasone  (FLONASE ) 50 MCG/ACT nasal spray Place 1 spray into both nostrils at bedtime.     Yes [provider]  gabapentin  (NEURONTIN ) 300 MG capsule Take 1 capsule (300 mg total) by mouth at bedtime. 07/17/23   Yes Daryl Setter, NP  glucose blood test strip  TRUE Metrix Blood Glucose Test Strip, use as instructed 11/12/20   Yes Daryl Setter, NP  hydrALAZINE  (APRESOLINE ) 50 MG tablet Take 1 tablet (50 mg total) by mouth 3 (three) times daily. 10/10/23   Yes O'Sullivan, Melissa, NP  hydrochlorothiazide  (HYDRODIURIL ) 25 MG tablet Take 1 tablet (25 mg total) by mouth daily. 03/10/24   Yes Daryl Setter, NP  Iron , Ferrous Sulfate , 325 (65 Fe) MG TABS Take 325 mg by mouth every other day. 08/10/22   Yes Daryl Setter, NP  Lancets MISC 1 each by Does not apply route 2 (two) times daily. TRUE matrix lancets 11/12/20   Yes Daryl Setter, NP  levothyroxine  (SYNTHROID ) 112 MCG tablet TAKE 1 TABLET BY MOUTH ONCE DAILY BEFORE BREAKFAST TAKE  WITH  ADDITIONAL  TO  50  MCG  TABLETS 01/24/24   Yes Daryl Setter, NP  levothyroxine  (SYNTHROID ) 50 MCG tablet TAKE 1 TABLET BY MOUTH BEFORE BREAKFAST TAKE  WITH  ADDITIONAL  TO  112  MCG  TABLETS. 01/21/24   Yes O'Sullivan, Melissa, NP  loratadine  (CLARITIN ) 10 MG tablet Take 10 mg by mouth daily as needed for allergies or rhinitis.  Yes [provider]  Magnesium  500 MG TABS Take 500 mg by mouth daily.     Yes [provider]  Menthol , Topical Analgesic, (ICY HOT EX) Apply 1 Application topically at bedtime. Lidocaine  / on back     Yes [provider]  metroNIDAZOLE  (METROCREAM ) 0.75 % cream Apply 1 Application topically daily as needed (head).     Yes [provider]  multivitamin-lutein  (OCUVITE-LUTEIN ) CAPS capsule Take 1 capsule by mouth daily.     Yes [provider]  mupirocin  ointment (BACTROBAN ) 2 % Apply 1 Application topically 2 (two) times daily. 03/12/24   Yes Sikora, Rebecca, DPM  nitroGLYCERIN  (NITROSTAT ) 0.4 MG SL tablet Place 1 tablet (0.4 mg total) under the tongue every 5 (five) minutes as needed for chest pain. 09/26/23   Yes Daryl Setter, NP  OVER THE COUNTER MEDICATION Place 1 spray into both nostrils 2 (two) times daily. Sinus  saline     Yes [provider]  OVER THE COUNTER MEDICATION Take 15 mLs by mouth at bedtime. Yellow Mustard     Yes [provider]  polyethylene glycol powder (GLYCOLAX /MIRALAX ) 17 GM/SCOOP powder Take 17 g by mouth 2 (two) times daily as needed for mild constipation, moderate constipation or severe constipation. 12/30/22   Yes Geiple, Joshua, PA-C  polyvinyl alcohol  (ARTIFICIAL TEARS) 1.4 % ophthalmic solution Place 1 drop into both eyes daily as needed for dry eyes.     Yes [provider]  PREBIOTIC PRODUCT PO Take 1 tablet by mouth daily.     Yes [provider]  rosuvastatin  (CRESTOR ) 5 MG tablet Take 1 tablet (5 mg total) by mouth daily. 02/21/24   Yes Daryl Setter, NP  senna-docusate (SENOKOT-S) 8.6-50 MG tablet Take 2 tablets by mouth 2 (two) times daily. 09/17/23   Yes Krishnan, Gokul, MD  valsartan  (DIOVAN ) 320 MG tablet Take 1 tablet (320 mg total) by mouth daily. 10/24/23   Yes Daryl Setter, NP  vitamin B-12 (CYANOCOBALAMIN ) 500 MCG tablet Take 500 mcg by mouth in the morning.     Yes [provider]      Blood pressure (!) 161/72, pulse 64, height 5' 6 (1.676 m), weight 135 lb (61.2 kg), SpO2 95%. Exam: General: Communicates without difficulty, well nourished, no acute distress. Head: Normocephalic, no evidence injury, no tenderness, facial buttresses intact without stepoff. Face/sinus: No tenderness to palpation and percussion. Facial movement is normal and symmetric. Eyes: PERRL, EOMI. No scleral icterus, conjunctivae clear. Neuro: CN II exam reveals vision grossly intact.  No nystagmus at any point of gaze. Ears: Auricles well formed without lesions.  Ear canals are intact without mass or lesion.  No erythema or edema is appreciated.  The TMs are intact without fluid. Nose: External evaluation reveals normal support and skin without lesions.  Dorsum is intact.  Anterior rhinoscopy reveals congested mucosa over anterior aspect of  inferior turbinates and severely deviated septum.  No purulence noted. Oral:  Oral cavity and oropharynx are intact, symmetric, without erythema or edema.  Mucosa is moist without lesions. Neck: Full range of motion without pain.  There is no significant lymphadenopathy.  No masses palpable.  Thyroid  bed within normal limits to palpation.  Parotid glands and submandibular glands equal bilaterally without mass.  Trachea is midline. Neuro:  CN 2-12 grossly intact.    Procedure:  Flexible Nasal Endoscopy: Description: Risks, benefits, and alternatives of flexible endoscopy were explained to the patient.  Specific mention was made of the risk of  throat numbness with difficulty swallowing, possible bleeding from the nose and mouth, and pain from the procedure.  The patient gave oral consent to proceed.  The flexible scope was inserted into the right nasal cavity.  Endoscopy of the interior nasal cavity, superior, inferior, and middle meatus was performed. The sphenoid-ethmoid recess was examined. Edematous mucosa was noted.  No polyp, mass, or lesion was appreciated. Nasal septal deviation noted. Olfactory cleft was clear.  Nasopharynx was clear.  Turbinates were hypertrophied but without mass.  The procedure was repeated on the contralateral side with similar findings.  The patient tolerated the procedure well.    Assessment: 1.  Chronic rhinitis with nasal mucosal congestion, severe nasal septal deviation, and bilateral inferior turbinate hypertrophy.  Borderline 99% of his nasal passageways are obstructed bilaterally. 2.  Chronic postnasal drainage. 3.  The patient has not responded to medical treatment for the past 5 years.  He was previously treated with Flonase  nasal spray and nasal saline irrigation.   Plan: 1.  The physical exam and nasal endoscopy findings are reviewed with the patient. 2.  Continue with Flonase  nasal spray 2 sprays each nostril daily. 3.  Based on the above findings, the patient  will benefit from surgical intervention with septoplasty and bilateral inferior turbinate reduction.  The risk, benefits, alternatives, and details of the procedures are extensively discussed.  Questions are invited and answered. 4.  The patient would like to proceed with the procedures.

## 2024-03-31 NOTE — Anesthesia Postprocedure Evaluation (Signed)
 Anesthesia Post Note  Patient: [name] Pratt  Procedure(s) Performed: SEPTOPLASTY, NOSE, WITH NASAL TURBINATE REDUCTION (Bilateral: Nose)     Patient location during evaluation: PACU Anesthesia Type: General Level of consciousness: awake Pain management: pain level controlled Vital Signs Assessment: post-procedure vital signs reviewed and stable Respiratory status: spontaneous breathing, nonlabored ventilation and respiratory function stable Cardiovascular status: blood pressure returned to baseline and stable Postop Assessment: no apparent nausea or vomiting Anesthetic complications: no   No notable events documented.  Last Vitals:  Vitals:   03/31/24 1110 03/31/24 1115  BP: 135/63 (!) 155/68  Pulse: 66 71  Resp: 13 16  Temp:  (!) 36.3 C  SpO2: 94% 95%    Last Pain:  Vitals:   03/31/24 1115  TempSrc:   PainSc: 0-No pain                 [name]

## 2024-03-31 NOTE — Discharge Instructions (Addendum)
 Post Anesthesia Home Care Instructions  Activity: Get plenty of rest for the remainder of the day. A responsible individual must stay with you for 24 hours following the procedure.  For the next 24 hours, DO NOT: -Drive a car -Advertising copywriter -Drink alcoholic beverages -Take any medication unless instructed by your physician -Make any legal decisions or sign important papers.  Meals: Start with liquid foods such as gelatin or soup. Progress to regular foods as tolerated. Avoid greasy, spicy, heavy foods. If nausea and/or vomiting occur, drink only clear liquids until the nausea and/or vomiting subsides. Call your physician if vomiting continues.  Special Instructions/Symptoms: Your throat may feel dry or sore from the anesthesia or the breathing tube placed in your throat during surgery. If this causes discomfort, gargle with warm salt water. The discomfort should disappear within 24 hours.  If you had a scopolamine  patch placed behind your ear for the management of post- operative nausea and/or vomiting:  1. The medication in the patch is effective for 72 hours, after which it should be removed.  Wrap patch in a tissue and discard in the trash. Wash hands thoroughly with soap and water. 2. You may remove the patch earlier than 72 hours if you experience unpleasant side effects which may include dry mouth, dizziness or visual disturbances. 3. Avoid touching the patch. Wash your hands with soap and water after contact with the patch.     Next dose of Tylenol  may be given at 2pm if needed.   ------------  POSTOPERATIVE INSTRUCTIONS FOR PATIENTS HAVING NASAL OR SINUS OPERATIONS ACTIVITY: Restrict activity at home for the first two days, resting as much as possible. Light activity is best. You may usually return to work within a week. You should refrain from nose blowing, strenuous activity, or heavy lifting greater than 20lbs for a total of one week after your operation.  If sneezing  cannot be avoided, sneeze with your mouth open. DISCOMFORT: You may experience a dull headache and pressure along with nasal congestion and discharge. These symptoms may be worse during the first week after the operation but may last as long as two to four weeks.  Please take Tylenol  or the pain medication that has been prescribed for you. Do not take aspirin  or aspirin  containing medications since they may cause bleeding.  You may experience symptoms of post nasal drainage, nasal congestion, headaches and fatigue for two or three months after your operation.  BLEEDING: You may have some blood tinged nasal drainage for approximately two weeks after the operation.  The discharge will be worse for the first week.  Please call our office at [phone] or go to the nearest hospital emergency room if you experience any of the following: heavy, bright red blood from your nose or mouth that lasts longer than 15 minutes or coughing up or vomiting bright red blood or blood clots. GENERAL CONSIDERATIONS: A gauze dressing will be placed on your upper lip to absorb any drainage after the operation. You may need to change this several times a day.  If you do not have very much drainage, you may remove the dressing.  Remember that you may gently wipe your nose with a tissue and sniff in, but DO NOT blow your nose. Please keep all of your postoperative appointments.  Your final results after the operation will depend on proper follow-up.  The initial visit is usually 2 to 5 days after the operation.  During this visit, the remaining nasal packing and internal  septal splints will be removed.  Your nasal and sinus cavities will be cleaned.  During the second visit, your nasal and sinus cavities will be cleaned again. Have someone drive you to your first two postoperative appointments.  How you care for your nose after the operation will influence the results that you obtain.  You should follow all directions, take your  medication as prescribed, and call our office [phone] with any problems or questions. You may be more comfortable sleeping with your head elevated on two pillows. Do not take any medications that we have not prescribed or recommended. WARNING SIGNS: if any of the following should occur, please call our office: Persistent fever greater than 102F. Persistent vomiting. Severe and constant pain that is not relieved by prescribed pain medication. Trauma to the nose. Rash or unusual side effects from any medicines.

## 2024-03-31 NOTE — Op Note (Signed)
 DATE OF PROCEDURE: 03/31/2024  OPERATIVE REPORT   SURGEON: Daniel Moccasin, MD   PREOPERATIVE DIAGNOSES:  1. Severe nasal septal deviation.  2. Bilateral inferior turbinate hypertrophy.  3. Chronic nasal obstruction.  POSTOPERATIVE DIAGNOSES:  1. Severe nasal septal deviation.  2. Bilateral inferior turbinate hypertrophy.  3. Chronic nasal obstruction.  PROCEDURE PERFORMED:  1. Septoplasty.  2. Bilateral partial inferior turbinate resection.   ANESTHESIA: General endotracheal tube anesthesia.   COMPLICATIONS: None.   ESTIMATED BLOOD LOSS: 50 mL.   INDICATION FOR PROCEDURE: [name] Pratt is a 83 y.o. male with a history of chronic nasal obstruction. The patient was treated with antihistamine and steroid nasal sprays. However, the patient continued to be symptomatic. On examination, the patient was noted to have bilateral severe inferior turbinate hypertrophy and significant nasal septal deviation, causing significant nasal obstruction. Based on the above findings, the decision was made for the patient to undergo the above-stated procedures. The risks, benefits, alternatives, and details of the procedures were discussed with the patient. Questions were invited and answered. Informed consent was obtained.   DESCRIPTION OF PROCEDURE: The patient was taken to the operating room and placed supine on the operating table. General endotracheal tube anesthesia was administered by the anesthesiologist. The patient was positioned, and prepped and draped in the standard fashion for nasal surgery. Pledgets soaked with Afrin were placed in both nasal cavities for decongestion. The pledgets were subsequently removed.   Examination of the nasal cavity revealed a severe nasal septal deviation. 1% lidocaine  with 1:100,000 epinephrine  was injected onto the nasal septum bilaterally. A hemitransfixion incision was made on the left side. The mucosal flap was carefully elevated on the left side. A cartilaginous  incision was made 1 cm superior to the caudal margin of the nasal septum. Mucosal flap was also elevated on the right side in the similar fashion. It should be noted that due to the severe septal deviation, the deviated portion of the cartilaginous and bony septum had to be removed in piecemeal fashion. Once the deviated portions were removed, a straight midline septum was achieved. The septum was then quilted with 4-0 plain gut sutures. The hemitransfixion incision was closed with interrupted 4-0 chromic sutures.   The inferior one half of both hypertrophied inferior turbinate was crossclamped with a Kelly clamp. The inferior one half of each inferior turbinate was then resected with a pair of cross cutting scissors. Hemostasis was achieved with a suction cautery device.  Doyle splints were applied to the nasal septum.  The care of the patient was turned over to the anesthesiologist. The patient was awakened from anesthesia without difficulty. The patient was extubated and transferred to the recovery room in good condition.   OPERATIVE FINDINGS: Severe nasal septal deviation and bilateral inferior turbinate hypertrophy.   SPECIMEN: None.   FOLLOWUP CARE: The patient be discharged home once he is awake and alert. The patient will follow up in my office in 3 days for splint removal.   Rafik Koppel Dois Moccasin, MD

## 2024-04-01 ENCOUNTER — Other Ambulatory Visit: Payer: Self-pay | Admitting: Licensed Clinical Social Worker

## 2024-04-01 ENCOUNTER — Encounter (HOSPITAL_BASED_OUTPATIENT_CLINIC_OR_DEPARTMENT_OTHER): Payer: Self-pay | Admitting: Otolaryngology

## 2024-04-01 NOTE — Patient Instructions (Signed)
 Visit Information  Thank you for taking time to visit with me today. Please don't hesitate to contact me if I can be of assistance to you before our next scheduled appointment.  Our next appointment is by telephone on 05/13/24 at 11:00 AM   Please call the care guide team at [phone] if you need to cancel or reschedule your appointment.   Following is a copy of your care plan:   Goals Addressed             This Visit's Progress    VBCI Social Work Care Plan       Problems:   Pain issues             Transportation problems             Vision challenges             Concentration challenges            Takes time to complete ADLS             OSA              Hearing loss             Has 16 stairs in home; hard to navigate in home stairs (fall risk)  CSW Clinical Goal(s):   Over the next 30 days the Patient will attend all scheduled medical appointments as evidenced by patient report and care team review of appointment completion in electronic MEDICAL RECORD NUMBER .             Over next 30 days client will work to manage medical needs faced AEB client  report of management of current medical needs faced by client (client to communicate as needed with medical providers and attend scheduled provider appointments)  Interventions:  Client and LCSW spoke of client mobility issues. He has 16 steps in home and it is hard for him to navigate these steps.              Discussed medication procurement. Discussed sleeping issues of client.Discussed client upcoming medical appointments.             Discussed ambulation of client. Client uses walker or cane to help him walk            Discussed transport needs of client. He drives short distances            Discussed balance issues of client            Discussed vision of client            Discussed client support from spouse. Client has limited help from his spouse            Discussed program support with RN, LCSW, Pharmacist.              Provided counseling support for client            Used Active Listening techniques to hear needs of patient            Client spoke of occasional pain in right arm. He spoke of some challenges getting in and out of his truck to travel            Discussed client support with Eleanor KIDDTHEORA Archer NP            Encouraged client to call LCSW for SW support as needed  Client was appreciative of LCSW call to client today              Patient Goals/Self-Care Activities:  Client to attend scheduled medical appointments             Client to take medications as prescribed             Client to use walker or cane as needed to help him walk safely             Client to discuss medical issues faced with NP Eleanor MALVA Archer             Client to talk with [facility] representative about in home mobility support for client related to stairs in client home             Client to call LCSW as needed for SW support  Plan:   LCSW to call client on 05/13/24 at 11:00 AM          Please go to [facility] [address], [place] [phone]) if you are experiencing a Mental Health or Behavioral Health Crisis or need someone to talk to.  The patient verbalized understanding of instructions, educational materials, and care plan provided today and DECLINED offer to receive copy of patient instructions, educational materials, and care plan.    Glendia Pear  MSW, LCSW [place] Licensed Clinical Social Worker Direct Dial:  [phone] Fax:  [phone] Website:  [email or web address]

## 2024-04-01 NOTE — Patient Outreach (Signed)
 Complex Care Management   Visit Note  04/01/2024  Name:  [name] Pratt MRN: [id number] DOB: Jul 24, 1941  Situation: Referral received for Complex Care Management related to client management of medical needs I obtained verbal consent from patient .  Visit completed with patient on the phone  Background:   Past Medical History:  Diagnosis Date   Acute kidney injury (HCC) 05/02/2022   Acute metabolic encephalopathy 04/24/2022   Acute respiratory failure with hypoxia (HCC) 09/14/2023   Adenomatous polyp of ascending colon    Adenomatous polyp of colon    Allergic rhinitis 02/02/2016   Anemia 12/17/2009   Formatting of this note might be different from the original. 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 Hypertension   Benign paroxysmal positional vertigo 09/14/2021   Bilateral sacroiliitis (HCC) 07/22/2021   Body mass index (BMI) 25.0-25.9, adult 11/02/2020   Cancer (HCC)    skin cancer   Cardiac murmur 07/25/2021   Carotid stenosis    a. Carotid U/S 5/13: LICA < 50%, RICA 50-69%;  b.  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   Cystoid macular edema of both eyes 07/28/2020   Degenerative disc disease, lumbar 06/30/2015   Deviated nasal  septum 02/18/2015   Diverticulosis 03/03/2020   DJD (degenerative joint disease) of hip 12/24/2012   Drowsiness 12/07/2022   Dry eyes 09/14/2023   Dyspnea 06/12/2011   Emphysema, unspecified (HCC) 03/05/2020   Erectile dysfunction 06/20/2017   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   Fall 09/18/2022   Gait disorder 03/02/2021   Gait instability 03/02/2021   GERD (gastroesophageal reflux disease)    GI bleed 04/21/2022   GOUT 05/13/2009   Qualifier: Diagnosis of  By: Gwenn Runell     Greater trochanteric pain syndrome 07/22/2021   Hand pain, left 01/16/2022   Hand weakness 05/02/2022   Hearing loss 02/02/2016   Hematochezia    Heme positive stool 02/29/2020   Hemorrhagic shock (HCC)    HIATAL HERNIA 05/13/2009   Qualifier: Diagnosis of   By: Gwenn Runell      Replacing diagnoses that were inactivated after the 12/11/22 regulatory import     High risk medication use 01/18/2012   Hip pain 03/02/2021   History of chicken pox    History of COVID-19 12/05/2021   History of hemorrhoids    History of kidney stones    History of skin cancer 07/22/2021   skin cancer   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  Cardiology - Dr  Nishen at [facility] cardiology     ICD-10 cut over     Hyperlipidemia with target LDL less than 130 12/27/2010   Formatting of this note might be different from the original. Cardiology - Dr Orlean at [facility] cardiology  ICD-10 cut over   Hypokalemia 07/08/2021   Hypothyroidism    Internal hemorrhoids 03/03/2020   Iron  deficiency anemia 09/18/2022   Irregular heart beat 02/03/2022   Leg cramps 11/13/2019   Lower GI bleed 12/12/2012   Lumbago of lumbar region with sciatica 10/21/2020   Lumbar radiculopathy  03/02/2021   Lumbar spondylosis 11/02/2020   NEPHROLITHIASIS 05/13/2009   Qualifier: Diagnosis of  By: Gwenn Grimes     Nonspecific abnormal electrocardiogram (ECG) (EKG) 01/06/2013   Numbness of hand 01/24/2022   Onychomycosis 06/30/2015   OSA (obstructive sleep apnea) 11/25/2012   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 Osteoarthritis Of The Hand  10/1 IMO update   Osteoarthrosis, unspecified whether generalized or localized, pelvic region and thigh 07/25/2010   Formatting of this note might be different from the original. 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  User time 6 hours.  479 days , not used only 12 days - highly compliant 06-23-13  .    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   Urinary retention 11/26/2020   Vasovagal syncope    Ventral hernia 07/08/2012   Vertigo 07/08/2021   Vitamin D  deficiency 08/07/2022   Weakness of lower extremity 10/10/2022   Weight loss 03/03/2020    Assessment: Patient Reported Symptoms:  Cognitive Cognitive Status: Alert and oriented to person, place, and time Cognitive/Intellectual Conditions Management [RPT]: None reported or documented in medical history or problem list   Health Maintenance Behaviors: Sleep adequate, Social activities, Stress management  Neurological Neurological Review of Symptoms: Weakness Neurological Management Strategies: Adequate rest, Coping strategies  HEENT HEENT Symptoms Reported: Change or loss of hearing HEENT Management Strategies: Adequate rest, Coping strategies    Cardiovascular Cardiovascular Symptoms Reported: Fatigue, Dizziness Does patient have uncontrolled Hypertension?: Yes Cardiovascular Management Strategies: Adequate rest, Coping strategies  Respiratory Respiratory Symptoms Reported: Shortness of breath Other Respiratory Symptoms: dyspnea;  OSA Respiratory Management Strategies: Adequate rest, Coping strategies, Medication therapy  Endocrine Endocrine Symptoms Reported: Weakness or fatigue, Shakiness, Shortness of breath, Blurry vision (receives treatment for retina)    Gastrointestinal Gastrointestinal Symptoms Reported: No symptoms reported Additional Gastrointestinal Details: GERD Gastrointestinal Management Strategies: Adequate rest, Coping strategies    Genitourinary Genitourinary Symptoms Reported: Frequency Genitourinary Management Strategies: Adequate rest, Activity, Coping strategies  Integumentary Integumentary Symptoms Reported: Skin changes Additional Integumentary Details: bruise on buttocks area Skin Management Strategies: Adequate rest, Activity,  Coping strategies  Musculoskeletal Musculoskelatal Symptoms Reviewed: Weakness, Muscle pain, Difficulty walking Additional Musculoskeletal Details: some pain in walking Musculoskeletal Management Strategies: Adequate rest, Coping strategies Falls in the past year?: Yes Number of falls in past year: 1 or less Was there an injury with Fall?: No Fall Risk Category Calculator: 1 Patient Fall Risk Level: Low Fall Risk Patient at Risk for Falls Due to: Impaired balance/gait  Psychosocial Psychosocial Symptoms Reported: Anxiety - if selected complete GAD, Depression - if selected complete PHQ 2-9, Sadness - if selected complete PHQ 2-9 Additional Psychological Details: Some depression; anxiety symptoms occasionally Behavioral Management Strategies: Activity, Coping strategies, Adequate rest Major Change/Loss/Stressor/Fears (CP): Medical condition, self Techniques to Cope with Loss/Stress/Change: Counseling, Diversional activities Quality of Family Relationships: supportive Do you feel physically threatened by others?: No      04/01/2024   10:31 AM  Depression screen PHQ 2/9  Decreased Interest 1  Down, Depressed, Hopeless 1  PHQ - 2 Score 2  Altered sleeping 1  Tired, decreased energy 1  Change in appetite 0  Feeling bad or failure about yourself  1  Trouble concentrating 1  Moving slowly or fidgety/restless 1  Suicidal thoughts 0  PHQ-9 Score 7  Difficult doing work/chores Somewhat difficult    Vitals:  BP today 137/70 per client information   Medications Reviewed Today     Reviewed by Frances Ozell GORMAN KEN (Social Worker) on 04/01/24 at 1013  Med List Status: <None>   Medication Order Taking? Sig Documenting Provider Last Dose Status Informant  acetaminophen  (TYLENOL ) 500 MG tablet [id number] Yes Take 500 mg by mouth at bedtime as needed for moderate pain (pain score 4-6). [provider]  Active Self, Pharmacy Records  albuterol  (VENTOLIN  HFA) 108 [phone] Base) MCG/ACT  inhaler [id number] Yes Inhale 2 puffs into the lungs every 6 (six) hours as needed for wheezing or shortness of breath. INHALE 2 PUFFS INTO THE LUNGS EVERY 6 HOURS AS NEEDED FOR SHORTNESS OF BREATH. Daryl Setter, NP  Active   allopurinol  (ZYLOPRIM ) 100 MG tablet [id number] Yes Take 1 tablet (100 mg total) by mouth 2 (two) times daily. O'Sullivan, Melissa, NP  Active   amLODipine  (NORVASC ) 2.5 MG tablet [id number] Yes Take 1 tablet (2.5 mg total) by mouth 2 (two) times daily. Daryl Setter, NP  Active   antiseptic oral rinse KEENAN) LIQD [id number] Yes 15 mLs by Mouth Rinse route 2 (two) times daily. [provider]  Active Self, Pharmacy Records  bacitracin  ointment [id number] Yes Apply 1 Application topically 2 (two) times daily. Ruthe Cornet, DO  Active Self, Pharmacy Records  cholestyramine  (QUESTRAN ) 4 GM/DOSE powder [id number] Yes Take 4 g by mouth as needed (digestion). 30 ml [provider]  Active Self, Pharmacy Records  diclofenac  Sodium (VOLTAREN ) 1 % GEL [id number] Yes Apply topically. [provider]  Active   gabapentin  (NEURONTIN ) 300 MG capsule [id number] Yes Take 1 capsule (300 mg total) by mouth at bedtime. Daryl Setter, NP  Active Self, Pharmacy Records  glucose blood test strip [id number] Yes TRUE Metrix Blood Glucose Test Strip, use as instructed Daryl Setter, NP  Active Self, Pharmacy Records  hydrALAZINE  (APRESOLINE ) 50 MG tablet [id number] Yes Take 1 tablet (50 mg total) by mouth 3 (three) times daily. Daryl Setter, NP  Active   hydrochlorothiazide  (HYDRODIURIL ) 25 MG tablet [id number] Yes Take 1 tablet (25 mg total) by mouth daily. Daryl Setter, NP  Active   Lancets MISC [id number] Yes 1 each by Does not apply route 2 (two) times daily. TRUE matrix lancets Daryl Setter, NP  Active Self, Pharmacy Records  levothyroxine  (SYNTHROID ) 112 MCG tablet [id number] Yes TAKE 1 TABLET BY MOUTH ONCE DAILY BEFORE BREAKFAST TAKE   WITH  ADDITIONAL  TO  50  MCG  TABLETS Daryl Setter, NP  Active   levothyroxine  (SYNTHROID ) 50 MCG tablet [id number] Yes TAKE 1 TABLET BY MOUTH BEFORE BREAKFAST TAKE  WITH  ADDITIONAL  TO  112  MCG  TABLETS. Daryl Setter, NP  Active   loratadine  (CLARITIN ) 10 MG tablet [id number] Yes Take 10 mg by mouth daily as needed for allergies or rhinitis. [provider]  Active Self, Pharmacy Records  Magnesium  500 MG TABS [id number] Yes Take 500 mg by mouth daily. [provider]  Active Self, Pharmacy Records  Menthol , Topical Analgesic, (ICY HOT EX) [id number] Yes Apply 1 Application topically at bedtime. Lidocaine  / on back [provider]  Active Self, Pharmacy Records  metroNIDAZOLE  (METROCREAM ) 0.75 % cream [id number] Yes Apply 1 Application topically daily as needed (head). [provider]  Active Self, Pharmacy Records  multivitamin-lutein  (OCUVITE-LUTEIN ) CAPS capsule [id number] Yes Take 1 capsule by mouth daily. [provider]  Active Self, Pharmacy Records  mupirocin  ointment (BACTROBAN ) 2 % [id number] Yes Apply 1 Application topically 2 (two) times daily. Sikora, Rebecca, DPM  Active   nitroGLYCERIN  (NITROSTAT ) 0.4 MG SL tablet [id number] Yes Place 1 tablet (0.4 mg total) under the tongue every 5 (five) minutes as needed for chest pain. Daryl Setter, NP  Active   OVER THE COUNTER MEDICATION [id number] Yes Place 1 spray into both nostrils 2 (two) times daily. Sinus saline [provider]  Active Self, Pharmacy Records  OVER THE COUNTER MEDICATION [id number] Yes Take 15 mLs by mouth at bedtime. Yellow Mustard [provider]  Active Self, Pharmacy Records  polyethylene glycol powder (GLYCOLAX /MIRALAX ) 17 GM/SCOOP powder [id number] Not taking Take 17 g by mouth 2 (two) times daily as needed for mild constipation, moderate constipation or severe constipation.  Patient not taking: Reported on 04/01/2024   Desiderio Chew, PA-C   Active Self, Pharmacy Records  polyvinyl alcohol  (ARTIFICIAL TEARS) 1.4 % ophthalmic solution [id number] Yes Place 1 drop into both eyes daily as needed for dry eyes. [provider]  Active Self, Pharmacy Records  PREBIOTIC PRODUCT PO [id number] Yes Take 1 tablet by mouth daily. [provider]  Active Self, Pharmacy Records  rosuvastatin  (CRESTOR ) 5 MG tablet [id number] Yes Take 1 tablet (5 mg total) by mouth daily. Daryl Setter, NP  Active   valsartan  (DIOVAN ) 320 MG tablet [id number] Yes Take 1 tablet (320 mg total) by mouth daily. Daryl Setter, NP  Active   vitamin B-12 (CYANOCOBALAMIN ) 500 MCG tablet [id number] Yes Take 500 mcg by mouth in the morning. [provider]  Active Self, Pharmacy Records            Recommendation:   Follow up with PCP as scheduled Take medications as prescribed Allow time for ADLs completion Allow time to do  activities of choice to help manage stress. Issues faced Call LCSW as needed for SW support  Follow Up Plan:   Telephone follow up appointment date/time:  05/13/24 at 11:00 AM   Glendia Pear  MSW, LCSW [place] Licensed Clinical Social Worker Direct Dial:  [phone] Fax:  [phone] Website:  [email or web address]

## 2024-04-02 ENCOUNTER — Ambulatory Visit: Payer: Self-pay

## 2024-04-02 ENCOUNTER — Other Ambulatory Visit (HOSPITAL_BASED_OUTPATIENT_CLINIC_OR_DEPARTMENT_OTHER): Payer: Self-pay

## 2024-04-02 ENCOUNTER — Other Ambulatory Visit: Payer: Self-pay | Admitting: Family

## 2024-04-02 MED ORDER — ALBUTEROL SULFATE HFA 108 (90 BASE) MCG/ACT IN AERS
2.0000 | INHALATION_SPRAY | Freq: Four times a day (QID) | RESPIRATORY_TRACT | 1 refills | Status: DC | PRN
  Filled 2024-04-02: qty 6.7, 25d supply, fill #0

## 2024-04-02 NOTE — Telephone Encounter (Signed)
 Copied from CRM #[id number]. Topic: Clinical - Medication Refill >> Apr 02, 2024 10:21 AM Aleatha C wrote: Medication: albuterol  (VENTOLIN  HFA) 108 (90 Base) MCG/ACT inhaler  Has the patient contacted their pharmacy? No (Agent: If no, request that the patient contact the pharmacy for the refill. If patient does not wish to contact the pharmacy document the reason why and proceed with request.) (Agent: If yes, when and what did the pharmacy advise?)  This is the patient's preferred pharmacy:   [facility] [place] [facility] [address] [place] [address] Phone: [phone] Fax: [phone]  Is this the correct pharmacy for this prescription? Yes If no, delete pharmacy and type the correct one.   Has the prescription been filled recently? No  Is the patient out of the medication? Yes  Has the patient been seen for an appointment in the last year OR does the patient have an upcoming appointment? Yes  Can we respond through MyChart? No  Agent: Please be advised that Rx refills may take up to 3 business days. We ask that you follow-up with your pharmacy.

## 2024-04-02 NOTE — Telephone Encounter (Signed)
 Duplicate encounter, please see other triage encounters from today.

## 2024-04-02 NOTE — Telephone Encounter (Signed)
 FYI Only or Action Required?: FYI only for provider.  Patient was last seen in primary care on 02/22/2024 by Daryl Setter, NP.  Called Nurse Triage reporting Shortness of Breath.  Symptoms began yesterday.  Interventions attempted: Nothing.  Symptoms are: gradually worsening.  Triage Disposition: Go to ED Now (Notify PCP)  Patient/caregiver understands and will follow disposition?: No, refuses disposition       Copied from CRM [id number]. Topic: Clinical - Red Word Triage >> Apr 02, 2024 10:44 AM Charolett L wrote: [facility] that prompted transfer to Nurse Triage: patient is short of breath and having a hard time breathing Reason for Disposition  [1] MODERATE difficulty breathing (e.g., speaks in phrases, SOB even at rest, pulse 100-120) AND [2] NEW-onset or WORSE than normal  Answer Assessment - Initial Assessment Questions Declined to answer triage questions, states was trying to get a hold of ENT dr office that did his nasal surgery.  States does not want to go to ER for sob.  Patient hung up phone and states he will call someone who can help him.  Protocols used: Breathing Difficulty-A-AH

## 2024-04-02 NOTE — Telephone Encounter (Signed)
 FYI Only or Action Required?: Action required by provider: lab or test result follow-up needed and sleep study.  [name] Pratt is followed in Pulmonology for sleep apnea, last seen on 01/11/2024 by [name] Reggy BIRCH, [name] Pratt.  Called [name] Pratt reporting Advice Only.   Pratt Disposition: Call PCP Within 24 Hours-needing follow up call  [name] Pratt/caregiver understands and will follow disposition?: Yes  Reason for Disposition  [1] Caller requests to speak ONLY to PCP AND [2] NON-URGENT question  Answer Assessment - Initial Assessment Questions 1. REASON FOR CALL or QUESTION: What is your reason for calling today? or How can I best     [name] Pratt calling about the results from his sleep study from May. [name] Pratt reports shortness of breath at night. [name] Pratt endorses nasal surgery on 03/31/2024- surgical intervention with septoplasty and bilateral inferior turbinate reduction.  [name] Pratt called earlier and was triaged for shortness of breath. [name] Pratt refused ED recommendation. [name] Pratt states he is more short of breath in the morning since his procedure. [name] Pratt has packing and stents currently in his nose. [name] Pratt is wanting to see what his results are. [name] Pratt denies current shortness of breath, cough, chest pain. [name] Pratt is due for follow up appointment with ENT provider tomorrow  2. CALLER: Document the source of call. (e.g., laboratory staff, caregiver or [name] Pratt).     [name] Pratt.  Protocols used: PCP Call - No Pratt-A-AH

## 2024-04-03 ENCOUNTER — Encounter (INDEPENDENT_AMBULATORY_CARE_PROVIDER_SITE_OTHER): Payer: Self-pay | Admitting: Otolaryngology

## 2024-04-03 ENCOUNTER — Ambulatory Visit (INDEPENDENT_AMBULATORY_CARE_PROVIDER_SITE_OTHER): Admitting: Otolaryngology

## 2024-04-03 VITALS — BP 155/66 | HR 70

## 2024-04-03 DIAGNOSIS — J310 Chronic rhinitis: Secondary | ICD-10-CM

## 2024-04-03 DIAGNOSIS — Z9889 Other specified postprocedural states: Secondary | ICD-10-CM

## 2024-04-03 NOTE — Telephone Encounter (Signed)
 Dr Neysa, please advise home sleep test results.

## 2024-04-03 NOTE — Telephone Encounter (Signed)
 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 Then he can come back to us  and consider updating sleep study to see if he still needs this treated.

## 2024-04-03 NOTE — Progress Notes (Signed)
 Doyle splints removed. Septum and turbinates are healing well.   Both Foss debrided.  Nasal saline irrigation.  Recheck in 3 weeks.

## 2024-04-04 ENCOUNTER — Telehealth: Payer: Self-pay

## 2024-04-04 DIAGNOSIS — G4733 Obstructive sleep apnea (adult) (pediatric): Secondary | ICD-10-CM | POA: Diagnosis not present

## 2024-04-04 DIAGNOSIS — K439 Ventral hernia without obstruction or gangrene: Secondary | ICD-10-CM | POA: Diagnosis not present

## 2024-04-04 DIAGNOSIS — D631 Anemia in chronic kidney disease: Secondary | ICD-10-CM | POA: Diagnosis not present

## 2024-04-04 DIAGNOSIS — R531 Weakness: Secondary | ICD-10-CM | POA: Diagnosis not present

## 2024-04-04 DIAGNOSIS — I129 Hypertensive chronic kidney disease with stage 1 through stage 4 chronic kidney disease, or unspecified chronic kidney disease: Secondary | ICD-10-CM | POA: Diagnosis not present

## 2024-04-04 DIAGNOSIS — J449 Chronic obstructive pulmonary disease, unspecified: Secondary | ICD-10-CM | POA: Diagnosis not present

## 2024-04-04 DIAGNOSIS — R55 Syncope and collapse: Secondary | ICD-10-CM | POA: Diagnosis not present

## 2024-04-04 DIAGNOSIS — K5732 Diverticulitis of large intestine without perforation or abscess without bleeding: Secondary | ICD-10-CM | POA: Diagnosis not present

## 2024-04-04 DIAGNOSIS — E876 Hypokalemia: Secondary | ICD-10-CM | POA: Diagnosis not present

## 2024-04-04 DIAGNOSIS — I959 Hypotension, unspecified: Secondary | ICD-10-CM | POA: Diagnosis not present

## 2024-04-04 DIAGNOSIS — K449 Diaphragmatic hernia without obstruction or gangrene: Secondary | ICD-10-CM | POA: Diagnosis not present

## 2024-04-04 DIAGNOSIS — D649 Anemia, unspecified: Secondary | ICD-10-CM | POA: Diagnosis not present

## 2024-04-04 DIAGNOSIS — I6782 Cerebral ischemia: Secondary | ICD-10-CM | POA: Diagnosis not present

## 2024-04-04 DIAGNOSIS — R159 Full incontinence of feces: Secondary | ICD-10-CM | POA: Diagnosis not present

## 2024-04-04 DIAGNOSIS — R231 Pallor: Secondary | ICD-10-CM | POA: Diagnosis not present

## 2024-04-04 DIAGNOSIS — I952 Hypotension due to drugs: Secondary | ICD-10-CM | POA: Diagnosis not present

## 2024-04-04 DIAGNOSIS — N183 Chronic kidney disease, stage 3 unspecified: Secondary | ICD-10-CM | POA: Diagnosis not present

## 2024-04-04 DIAGNOSIS — N179 Acute kidney failure, unspecified: Secondary | ICD-10-CM | POA: Diagnosis not present

## 2024-04-04 DIAGNOSIS — Z7409 Other reduced mobility: Secondary | ICD-10-CM | POA: Diagnosis not present

## 2024-04-04 DIAGNOSIS — N200 Calculus of kidney: Secondary | ICD-10-CM | POA: Diagnosis not present

## 2024-04-04 DIAGNOSIS — T465X5A Adverse effect of other antihypertensive drugs, initial encounter: Secondary | ICD-10-CM | POA: Diagnosis not present

## 2024-04-04 DIAGNOSIS — I700 Atherosclerosis of aorta: Secondary | ICD-10-CM | POA: Diagnosis not present

## 2024-04-04 DIAGNOSIS — K861 Other chronic pancreatitis: Secondary | ICD-10-CM | POA: Diagnosis not present

## 2024-04-04 DIAGNOSIS — K5792 Diverticulitis of intestine, part unspecified, without perforation or abscess without bleeding: Secondary | ICD-10-CM | POA: Diagnosis not present

## 2024-04-04 DIAGNOSIS — E039 Hypothyroidism, unspecified: Secondary | ICD-10-CM | POA: Diagnosis not present

## 2024-04-04 DIAGNOSIS — R42 Dizziness and giddiness: Secondary | ICD-10-CM | POA: Diagnosis not present

## 2024-04-04 DIAGNOSIS — I452 Bifascicular block: Secondary | ICD-10-CM | POA: Diagnosis not present

## 2024-04-04 DIAGNOSIS — I440 Atrioventricular block, first degree: Secondary | ICD-10-CM | POA: Diagnosis not present

## 2024-04-04 DIAGNOSIS — M549 Dorsalgia, unspecified: Secondary | ICD-10-CM | POA: Diagnosis not present

## 2024-04-04 DIAGNOSIS — G8929 Other chronic pain: Secondary | ICD-10-CM | POA: Diagnosis not present

## 2024-04-04 NOTE — Telephone Encounter (Signed)
 Copied from CRM [id number]. Topic: Clinical - Lab/Test Results >> Apr 02, 2024  3:20 PM Rilla B wrote: Reason for CRM: Patient states he completed sleep study at home a couple months ago and has not heard anything from Dr Saundra office. Patient did call company and they stated the results were sent in. Please call pateint.

## 2024-04-04 NOTE — Telephone Encounter (Signed)
 Neysa Rama D, MD    04/03/24  1:44 PM Note Home sleep test 01/28/24 showed severe complex sleep apnea, with a mix of obstructive and central apneas and drop in blood oxygen level Since he has recently had nasal surgery, I suggest he wait till that has healed. Then he can come back to us  and consider updating sleep study to see if he still needs this treated.      Called patient.  Spoke with patients wife and reviewed information.  Patient's wife verbalized understanding.

## 2024-04-05 DIAGNOSIS — N179 Acute kidney failure, unspecified: Secondary | ICD-10-CM | POA: Diagnosis not present

## 2024-04-07 ENCOUNTER — Telehealth: Payer: Self-pay | Admitting: *Deleted

## 2024-04-07 NOTE — Telephone Encounter (Unsigned)
 Copied from CRM [id number]. Topic: General - Call Back - No Documentation >> Apr 07, 2024  4:08 PM Leah C wrote: Reason for CRM: Patient is currently in the hospital and has been in the hospital for the last three days due to low blood pressure. Patient may have to go to rehab afterwards but both he and his wife has a bone density scan test on scheduled on August 4th.  Patient is concerned about whether he should continue with the bone density test or not. He's not sure yet what rehab he is going to go to just yet.  Patient's spouse is experiencing lower back pain and he wants to make sure that his wife is going to be tested. He doesn't want his wife to go to the test alone. Patient just wants to make sure she is checked out for bone density test.   Patients contact is [phone] (M) and is requesting a call back from NP Melissa or Nurse.

## 2024-04-08 ENCOUNTER — Telehealth: Payer: Self-pay | Admitting: Family

## 2024-04-08 DIAGNOSIS — E039 Hypothyroidism, unspecified: Secondary | ICD-10-CM | POA: Diagnosis not present

## 2024-04-08 DIAGNOSIS — I129 Hypertensive chronic kidney disease with stage 1 through stage 4 chronic kidney disease, or unspecified chronic kidney disease: Secondary | ICD-10-CM | POA: Diagnosis not present

## 2024-04-08 DIAGNOSIS — K5792 Diverticulitis of intestine, part unspecified, without perforation or abscess without bleeding: Secondary | ICD-10-CM | POA: Diagnosis not present

## 2024-04-08 DIAGNOSIS — D631 Anemia in chronic kidney disease: Secondary | ICD-10-CM | POA: Diagnosis not present

## 2024-04-08 DIAGNOSIS — G8929 Other chronic pain: Secondary | ICD-10-CM | POA: Diagnosis not present

## 2024-04-08 DIAGNOSIS — N183 Chronic kidney disease, stage 3 unspecified: Secondary | ICD-10-CM | POA: Diagnosis not present

## 2024-04-08 DIAGNOSIS — J449 Chronic obstructive pulmonary disease, unspecified: Secondary | ICD-10-CM | POA: Diagnosis not present

## 2024-04-08 DIAGNOSIS — M549 Dorsalgia, unspecified: Secondary | ICD-10-CM | POA: Diagnosis not present

## 2024-04-08 DIAGNOSIS — G4733 Obstructive sleep apnea (adult) (pediatric): Secondary | ICD-10-CM | POA: Diagnosis not present

## 2024-04-08 DIAGNOSIS — R42 Dizziness and giddiness: Secondary | ICD-10-CM | POA: Diagnosis not present

## 2024-04-08 NOTE — Telephone Encounter (Signed)
 ATC X1. LMTCB

## 2024-04-08 NOTE — Telephone Encounter (Unsigned)
 Copied from CRM [id number]. Topic: General - Other >> Apr 08, 2024 12:15 PM Gennette ORN wrote: Reason for CRM: Patient is wanting a call back from Dr. Eleanor just to get an update on things. He mentioned her or Levorn can call him.

## 2024-04-08 NOTE — Telephone Encounter (Signed)
 Patient called back, just wanted to let us  know he was in the hospital and will go to rehab after that

## 2024-04-08 NOTE — Telephone Encounter (Signed)
 Called patient a few times to phone number provided  but no answer and mail bo is full

## 2024-04-10 DIAGNOSIS — D649 Anemia, unspecified: Secondary | ICD-10-CM | POA: Diagnosis not present

## 2024-04-10 DIAGNOSIS — N189 Chronic kidney disease, unspecified: Secondary | ICD-10-CM | POA: Diagnosis not present

## 2024-04-10 DIAGNOSIS — Z7409 Other reduced mobility: Secondary | ICD-10-CM | POA: Diagnosis not present

## 2024-04-10 DIAGNOSIS — R42 Dizziness and giddiness: Secondary | ICD-10-CM | POA: Diagnosis not present

## 2024-04-10 DIAGNOSIS — N179 Acute kidney failure, unspecified: Secondary | ICD-10-CM | POA: Diagnosis not present

## 2024-04-10 DIAGNOSIS — I952 Hypotension due to drugs: Secondary | ICD-10-CM | POA: Diagnosis not present

## 2024-04-11 DIAGNOSIS — R5381 Other malaise: Secondary | ICD-10-CM | POA: Diagnosis not present

## 2024-04-11 DIAGNOSIS — E039 Hypothyroidism, unspecified: Secondary | ICD-10-CM | POA: Diagnosis not present

## 2024-04-11 DIAGNOSIS — E785 Hyperlipidemia, unspecified: Secondary | ICD-10-CM | POA: Diagnosis not present

## 2024-04-11 DIAGNOSIS — I10 Essential (primary) hypertension: Secondary | ICD-10-CM | POA: Diagnosis not present

## 2024-04-11 DIAGNOSIS — R42 Dizziness and giddiness: Secondary | ICD-10-CM | POA: Diagnosis not present

## 2024-04-11 DIAGNOSIS — G909 Disorder of the autonomic nervous system, unspecified: Secondary | ICD-10-CM | POA: Diagnosis not present

## 2024-04-14 ENCOUNTER — Other Ambulatory Visit (HOSPITAL_BASED_OUTPATIENT_CLINIC_OR_DEPARTMENT_OTHER)

## 2024-04-14 ENCOUNTER — Telehealth (INDEPENDENT_AMBULATORY_CARE_PROVIDER_SITE_OTHER): Payer: Self-pay | Admitting: Otolaryngology

## 2024-04-14 ENCOUNTER — Telehealth: Payer: Self-pay

## 2024-04-14 DIAGNOSIS — G909 Disorder of the autonomic nervous system, unspecified: Secondary | ICD-10-CM | POA: Diagnosis not present

## 2024-04-14 DIAGNOSIS — R42 Dizziness and giddiness: Secondary | ICD-10-CM | POA: Diagnosis not present

## 2024-04-14 DIAGNOSIS — E039 Hypothyroidism, unspecified: Secondary | ICD-10-CM | POA: Diagnosis not present

## 2024-04-14 DIAGNOSIS — R5381 Other malaise: Secondary | ICD-10-CM | POA: Diagnosis not present

## 2024-04-14 DIAGNOSIS — E785 Hyperlipidemia, unspecified: Secondary | ICD-10-CM | POA: Diagnosis not present

## 2024-04-14 DIAGNOSIS — I10 Essential (primary) hypertension: Secondary | ICD-10-CM | POA: Diagnosis not present

## 2024-04-14 NOTE — Telephone Encounter (Signed)
 Returned patient 's phone call. Left him a voice mail.

## 2024-04-14 NOTE — Telephone Encounter (Signed)
 Spoke with pt wife and notified her of the dates   08/15 with PCP  08/18 with ENT

## 2024-04-14 NOTE — Telephone Encounter (Signed)
 Please call patient.  He has questions regarding procedure.

## 2024-04-14 NOTE — Telephone Encounter (Signed)
 Initial Comment Caller states he is in the hospital and wants to know when his appt. is with the specialist that did surgery on his nose. Please call him back. Translation No Disp. Time Titus Time) Disposition Final User 04/13/2024 2:13:47 PM General Information Provided Yes [name] Pratt Final Disposition 04/13/2024 2:13:47 PM General Information Provided Yes [name] Pratt

## 2024-04-15 DIAGNOSIS — I952 Hypotension due to drugs: Secondary | ICD-10-CM | POA: Diagnosis not present

## 2024-04-15 DIAGNOSIS — G909 Disorder of the autonomic nervous system, unspecified: Secondary | ICD-10-CM | POA: Diagnosis not present

## 2024-04-15 DIAGNOSIS — R5381 Other malaise: Secondary | ICD-10-CM | POA: Diagnosis not present

## 2024-04-15 DIAGNOSIS — E039 Hypothyroidism, unspecified: Secondary | ICD-10-CM | POA: Diagnosis not present

## 2024-04-15 DIAGNOSIS — E785 Hyperlipidemia, unspecified: Secondary | ICD-10-CM | POA: Diagnosis not present

## 2024-04-16 DIAGNOSIS — N189 Chronic kidney disease, unspecified: Secondary | ICD-10-CM | POA: Diagnosis not present

## 2024-04-16 DIAGNOSIS — D649 Anemia, unspecified: Secondary | ICD-10-CM | POA: Diagnosis not present

## 2024-04-16 DIAGNOSIS — I952 Hypotension due to drugs: Secondary | ICD-10-CM | POA: Diagnosis not present

## 2024-04-16 DIAGNOSIS — Z7409 Other reduced mobility: Secondary | ICD-10-CM | POA: Diagnosis not present

## 2024-04-16 DIAGNOSIS — N179 Acute kidney failure, unspecified: Secondary | ICD-10-CM | POA: Diagnosis not present

## 2024-04-16 DIAGNOSIS — R42 Dizziness and giddiness: Secondary | ICD-10-CM | POA: Diagnosis not present

## 2024-04-17 NOTE — Telephone Encounter (Signed)
 I called and spoke to pt's wife, Heron. (DPR) I informed Heron of Dr Saundra note and she verbalized understanding. NFN

## 2024-04-18 DIAGNOSIS — I10 Essential (primary) hypertension: Secondary | ICD-10-CM | POA: Diagnosis not present

## 2024-04-18 DIAGNOSIS — E039 Hypothyroidism, unspecified: Secondary | ICD-10-CM | POA: Diagnosis not present

## 2024-04-18 DIAGNOSIS — E785 Hyperlipidemia, unspecified: Secondary | ICD-10-CM | POA: Diagnosis not present

## 2024-04-18 DIAGNOSIS — R42 Dizziness and giddiness: Secondary | ICD-10-CM | POA: Diagnosis not present

## 2024-04-18 DIAGNOSIS — R5381 Other malaise: Secondary | ICD-10-CM | POA: Diagnosis not present

## 2024-04-18 DIAGNOSIS — G909 Disorder of the autonomic nervous system, unspecified: Secondary | ICD-10-CM | POA: Diagnosis not present

## 2024-04-21 ENCOUNTER — Telehealth: Payer: Self-pay | Admitting: Family

## 2024-04-21 DIAGNOSIS — E039 Hypothyroidism, unspecified: Secondary | ICD-10-CM | POA: Diagnosis not present

## 2024-04-21 DIAGNOSIS — I10 Essential (primary) hypertension: Secondary | ICD-10-CM | POA: Diagnosis not present

## 2024-04-21 DIAGNOSIS — G909 Disorder of the autonomic nervous system, unspecified: Secondary | ICD-10-CM | POA: Diagnosis not present

## 2024-04-21 DIAGNOSIS — R5381 Other malaise: Secondary | ICD-10-CM | POA: Diagnosis not present

## 2024-04-21 DIAGNOSIS — E785 Hyperlipidemia, unspecified: Secondary | ICD-10-CM | POA: Diagnosis not present

## 2024-04-21 DIAGNOSIS — R42 Dizziness and giddiness: Secondary | ICD-10-CM | POA: Diagnosis not present

## 2024-04-21 NOTE — Telephone Encounter (Unsigned)
 Copied from CRM [id number]. Topic: Clinical - Medication Question >> Apr 21, 2024  4:51 PM Tiffini S wrote: Reason for CRM: Patient called stating that he was seen in the hospital [facility]- was taken out some medications and is still on some medications. He needs to go thru the medicine to understand which ones to take. Please call back at [phone]. Patient is asking to talk with pcp tomorrow morning but do not call before 8am as the patient will be in bed and cannot get out of bed at that time.

## 2024-04-22 ENCOUNTER — Telehealth: Payer: Self-pay | Admitting: *Deleted

## 2024-04-22 ENCOUNTER — Ambulatory Visit: Payer: Self-pay

## 2024-04-22 ENCOUNTER — Telehealth: Payer: Self-pay

## 2024-04-22 NOTE — Telephone Encounter (Signed)
 Patient advised per provider keep his appointment Friday for hospital follow up, medication list will be reviewed with patient and he will get instructions. For now he should continue instructions given by rehab provider until he comes in.

## 2024-04-22 NOTE — Telephone Encounter (Signed)
 Copied from CRM [id number]. Topic: Clinical - Medication Question >> Apr 22, 2024 12:26 PM Mercedes MATSU wrote: Reason for CRM: Patients son Nayib Remer called in on his dads behalf stating that the patient was rushed to the ER, he was hospitalized for a week then rehab for a week. They stopped some meds, and changed a few of them and he has questions about a few of the medications. He states that some of the medications seems pretty excessive and he is requesting a call back from NP Melissa OSullivan, he can be reached at [phone] [facility]. He also request to call after 2, so between 3-5pm.

## 2024-04-22 NOTE — Transitions of Care (Post Inpatient/ED Visit) (Signed)
 04/22/2024  Name: [name] Pratt MRN: [id number] DOB: 13-Apr-1941  Today's TOC FU Call Status: Today's TOC FU Call Status:: Successful TOC FU Call Completed TOC FU Call Complete Date: 04/22/24 Patient's Name and Date of Birth confirmed.  Transition Care Management Follow-up Telephone Call Date of Discharge: 04/21/24 Type of Discharge: Inpatient Admission Primary Inpatient Discharge Diagnosis:: [place] How have you been since you were released from the hospital?: Better Any questions or concerns?: No  Items Reviewed: Did you receive and understand the discharge instructions provided?: Yes Medications obtained,verified, and reconciled?: Yes (Medications Reviewed) Any new allergies since your discharge?: No Dietary orders reviewed?: Yes Do you have support at home?: Yes People in Home [RPT]: spouse  Medications Reviewed Today: Medications Reviewed Today     Reviewed by Emmitt Pan, LPN (Licensed Practical Nurse) on 04/22/24 at 1038  Med List Status: <None>   Medication Order Taking? Sig Documenting Provider Last Dose Status Informant  acetaminophen  (TYLENOL ) 500 MG tablet [id number] Yes Take 500 mg by mouth at bedtime as needed for moderate pain (pain score 4-6). [provider]  Active Self, Pharmacy Records  albuterol  (VENTOLIN  HFA) [phone] Base) MCG/ACT inhaler [id number] Yes Inhale 2 puffs into the lungs every 6 (six) hours as needed for wheezing or shortness of breath. Daryl Setter, NP  Active   allopurinol  (ZYLOPRIM ) 100 MG tablet [id number] Yes Take 1 tablet (100 mg total) by mouth 2 (two) times daily. O'Sullivan, Melissa, NP  Active   amLODipine  (NORVASC ) 2.5 MG tablet [id number] Yes Take 1 tablet (2.5 mg total) by mouth 2 (two) times daily. Daryl Setter, NP  Active   antiseptic oral rinse KEENAN) LIQD [id number] Yes 15 mLs by Mouth Rinse route 2 (two) times daily. [provider]  Active Self, Pharmacy Records  bacitracin  ointment  [id number] Yes Apply 1 Application topically 2 (two) times daily. Ruthe Cornet, DO  Active Self, Pharmacy Records  cholestyramine  (QUESTRAN ) 4 GM/DOSE powder [id number] Yes Take 4 g by mouth as needed (digestion). 30 ml [provider]  Active Self, Pharmacy Records  diclofenac  Sodium (VOLTAREN ) 1 % GEL [id number] Yes Apply topically. [provider]  Active   gabapentin  (NEURONTIN ) 300 MG capsule [id number] Yes Take 1 capsule (300 mg total) by mouth at bedtime. Daryl Setter, NP  Active Self, Pharmacy Records  glucose blood test strip [id number] Yes TRUE Metrix Blood Glucose Test Strip, use as instructed Daryl Setter, NP  Active Self, Pharmacy Records  hydrALAZINE  (APRESOLINE ) 50 MG tablet [id number]  Take 1 tablet (50 mg total) by mouth 3 (three) times daily.  Patient not taking: Reported on 04/22/2024   Daryl Setter, NP  Active   hydrochlorothiazide  (HYDRODIURIL ) 25 MG tablet [id number] Yes Take 1 tablet (25 mg total) by mouth daily. Daryl Setter, NP  Active   Lancets MISC [id number] Yes 1 each by Does not apply route 2 (two) times daily. TRUE matrix lancets Daryl Setter, NP  Active Self, Pharmacy Records  levothyroxine  (SYNTHROID ) 112 MCG tablet [id number] Yes TAKE 1 TABLET BY MOUTH ONCE DAILY BEFORE BREAKFAST TAKE  WITH  ADDITIONAL  TO  50  MCG  TABLETS Daryl Setter, NP  Active   levothyroxine  (SYNTHROID ) 50 MCG tablet [id number] Yes TAKE 1 TABLET BY MOUTH BEFORE BREAKFAST TAKE  WITH  ADDITIONAL  TO  112  MCG  TABLETS. Daryl Setter, NP  Active   loratadine  (CLARITIN ) 10 MG tablet [id number] Yes Take 10 mg by mouth daily as needed for allergies or rhinitis. [provider]  Active  Self, Pharmacy Records  losartan  (COZAAR ) 25 MG tablet [id number] Yes Take 25 mg by mouth daily. [provider]  Active   Magnesium  500 MG TABS [id number] Yes Take 500 mg by mouth daily. [provider]  Active Self, Pharmacy Records   Menthol , Topical Analgesic, (ICY HOT EX) [id number] Yes Apply 1 Application topically at bedtime. Lidocaine  / on back [provider]  Active Self, Pharmacy Records  metroNIDAZOLE  (METROCREAM ) 0.75 % cream [id number] Yes Apply 1 Application topically daily as needed (head). [provider]  Active Self, Pharmacy Records  multivitamin-lutein  (OCUVITE-LUTEIN ) CAPS capsule [id number] Yes Take 1 capsule by mouth daily. [provider]  Active Self, Pharmacy Records  mupirocin  ointment (BACTROBAN ) 2 % [id number] Yes Apply 1 Application topically 2 (two) times daily. Sikora, Rebecca, DPM  Active   nitroGLYCERIN  (NITROSTAT ) 0.4 MG SL tablet [id number] Yes Place 1 tablet (0.4 mg total) under the tongue every 5 (five) minutes as needed for chest pain. Daryl Setter, NP  Active   OVER THE COUNTER MEDICATION [id number] Yes Place 1 spray into both nostrils 2 (two) times daily. Sinus saline [provider]  Active Self, Pharmacy Records  OVER THE COUNTER MEDICATION [id number] Yes Take 15 mLs by mouth at bedtime. Yellow Mustard [provider]  Active Self, Pharmacy Records  polyethylene glycol powder (GLYCOLAX /MIRALAX ) 17 GM/SCOOP powder [id number] Yes Take 17 g by mouth 2 (two) times daily as needed for mild constipation, moderate constipation or severe constipation. Desiderio Chew, PA-C  Active Self, Pharmacy Records  polyvinyl alcohol  (ARTIFICIAL TEARS) 1.4 % ophthalmic solution [id number] Yes Place 1 drop into both eyes daily as needed for dry eyes. [provider]  Active Self, Pharmacy Records  PREBIOTIC PRODUCT PO [id number] Yes Take 1 tablet by mouth daily. [provider]  Active Self, Pharmacy Records  rosuvastatin  (CRESTOR ) 5 MG tablet [id number] Yes Take 1 tablet (5 mg total) by mouth daily. Daryl Setter, NP  Active   valsartan  (DIOVAN ) 320 MG tablet [id number] Yes Take 1 tablet (320 mg total) by mouth daily. Daryl Setter, NP  Active    vitamin B-12 (CYANOCOBALAMIN ) 500 MCG tablet [id number] Yes Take 500 mcg by mouth in the morning. [provider]  Active Self, Pharmacy Records            Home Care and Equipment/Supplies: Were Home Health Services Ordered?: Yes Name of Home Health Agency:: unknown Has Agency set up a time to come to your home?: Yes First Home Health Visit Date: 04/30/24 Any new equipment or medical supplies ordered?: NA  Functional Questionnaire: Do you need assistance with bathing/showering or dressing?: Yes Do you need assistance with meal preparation?: Yes Do you need assistance with eating?: No Do you have difficulty maintaining continence: No Do you need assistance with getting out of bed/getting out of a chair/moving?: No Do you have difficulty managing or taking your medications?: Yes  Follow up appointments reviewed: PCP Follow-up appointment confirmed?: Yes Date of PCP follow-up appointment?: 04/25/24 [facility] Follow-up appointment confirmed?: NA Do you need transportation to your follow-up appointment?: No Do you understand care options if your condition(s) worsen?: Yes-patient verbalized understanding    SIGNATURE Julian Lemmings, LPN [facility] Nurse Health Advisor Direct Dial [phone]

## 2024-04-22 NOTE — Telephone Encounter (Addendum)
 Call was disconnected shortly after PAS transferred call. Pt reported that he had [facility] staff on the other line. There was much confusion as to who he was speaking to, so triager advised pt to speak to [facility] on the other line and call NT back if he needed further assistance. Patient verbalized understanding.      Copied from CRM [id number]. Topic: Clinical - Red Word Triage >> Apr 22, 2024 10:20 AM Suzen RAMAN wrote: Red Word that prompted transfer to Nurse Triage: burning/frequency  with urination,hoariness,dry mouth and weakness. Possibly UTI

## 2024-04-22 NOTE — Telephone Encounter (Signed)
 Duplicate message

## 2024-04-23 DIAGNOSIS — Z96651 Presence of right artificial knee joint: Secondary | ICD-10-CM | POA: Diagnosis not present

## 2024-04-23 DIAGNOSIS — M25551 Pain in right hip: Secondary | ICD-10-CM | POA: Diagnosis not present

## 2024-04-23 DIAGNOSIS — R5381 Other malaise: Secondary | ICD-10-CM | POA: Diagnosis not present

## 2024-04-23 DIAGNOSIS — S3993XA Unspecified injury of pelvis, initial encounter: Secondary | ICD-10-CM | POA: Diagnosis not present

## 2024-04-23 DIAGNOSIS — I440 Atrioventricular block, first degree: Secondary | ICD-10-CM | POA: Diagnosis not present

## 2024-04-23 DIAGNOSIS — R531 Weakness: Secondary | ICD-10-CM | POA: Diagnosis not present

## 2024-04-23 DIAGNOSIS — R262 Difficulty in walking, not elsewhere classified: Secondary | ICD-10-CM | POA: Diagnosis not present

## 2024-04-23 DIAGNOSIS — Z96641 Presence of right artificial hip joint: Secondary | ICD-10-CM | POA: Diagnosis not present

## 2024-04-23 DIAGNOSIS — M1000 Idiopathic gout, unspecified site: Secondary | ICD-10-CM | POA: Diagnosis not present

## 2024-04-23 DIAGNOSIS — R1084 Generalized abdominal pain: Secondary | ICD-10-CM | POA: Diagnosis not present

## 2024-04-23 DIAGNOSIS — G4733 Obstructive sleep apnea (adult) (pediatric): Secondary | ICD-10-CM | POA: Diagnosis not present

## 2024-04-23 DIAGNOSIS — E785 Hyperlipidemia, unspecified: Secondary | ICD-10-CM | POA: Diagnosis not present

## 2024-04-23 DIAGNOSIS — N183 Chronic kidney disease, stage 3 unspecified: Secondary | ICD-10-CM | POA: Diagnosis not present

## 2024-04-23 DIAGNOSIS — E860 Dehydration: Secondary | ICD-10-CM | POA: Diagnosis not present

## 2024-04-23 DIAGNOSIS — I129 Hypertensive chronic kidney disease with stage 1 through stage 4 chronic kidney disease, or unspecified chronic kidney disease: Secondary | ICD-10-CM | POA: Diagnosis not present

## 2024-04-23 DIAGNOSIS — I454 Nonspecific intraventricular block: Secondary | ICD-10-CM | POA: Diagnosis not present

## 2024-04-23 DIAGNOSIS — I10 Essential (primary) hypertension: Secondary | ICD-10-CM | POA: Diagnosis not present

## 2024-04-23 DIAGNOSIS — R0602 Shortness of breath: Secondary | ICD-10-CM | POA: Diagnosis not present

## 2024-04-23 DIAGNOSIS — J9811 Atelectasis: Secondary | ICD-10-CM | POA: Diagnosis not present

## 2024-04-23 DIAGNOSIS — R42 Dizziness and giddiness: Secondary | ICD-10-CM | POA: Diagnosis not present

## 2024-04-23 DIAGNOSIS — S3992XA Unspecified injury of lower back, initial encounter: Secondary | ICD-10-CM | POA: Diagnosis not present

## 2024-04-23 DIAGNOSIS — R1314 Dysphagia, pharyngoesophageal phase: Secondary | ICD-10-CM | POA: Diagnosis not present

## 2024-04-23 DIAGNOSIS — R109 Unspecified abdominal pain: Secondary | ICD-10-CM | POA: Diagnosis not present

## 2024-04-23 DIAGNOSIS — R1030 Lower abdominal pain, unspecified: Secondary | ICD-10-CM | POA: Diagnosis not present

## 2024-04-23 DIAGNOSIS — R0989 Other specified symptoms and signs involving the circulatory and respiratory systems: Secondary | ICD-10-CM | POA: Diagnosis not present

## 2024-04-23 DIAGNOSIS — E119 Type 2 diabetes mellitus without complications: Secondary | ICD-10-CM | POA: Diagnosis not present

## 2024-04-23 DIAGNOSIS — R41841 Cognitive communication deficit: Secondary | ICD-10-CM | POA: Diagnosis not present

## 2024-04-23 DIAGNOSIS — R2681 Unsteadiness on feet: Secondary | ICD-10-CM | POA: Diagnosis not present

## 2024-04-23 DIAGNOSIS — M6281 Muscle weakness (generalized): Secondary | ICD-10-CM | POA: Diagnosis not present

## 2024-04-23 DIAGNOSIS — R0902 Hypoxemia: Secondary | ICD-10-CM | POA: Diagnosis not present

## 2024-04-23 DIAGNOSIS — R0789 Other chest pain: Secondary | ICD-10-CM | POA: Diagnosis not present

## 2024-04-23 DIAGNOSIS — E039 Hypothyroidism, unspecified: Secondary | ICD-10-CM | POA: Diagnosis not present

## 2024-04-23 DIAGNOSIS — N179 Acute kidney failure, unspecified: Secondary | ICD-10-CM | POA: Diagnosis not present

## 2024-04-23 DIAGNOSIS — R079 Chest pain, unspecified: Secondary | ICD-10-CM | POA: Diagnosis not present

## 2024-04-23 DIAGNOSIS — I2510 Atherosclerotic heart disease of native coronary artery without angina pectoris: Secondary | ICD-10-CM | POA: Diagnosis not present

## 2024-04-23 DIAGNOSIS — M549 Dorsalgia, unspecified: Secondary | ICD-10-CM | POA: Diagnosis not present

## 2024-04-23 DIAGNOSIS — Z743 Need for continuous supervision: Secondary | ICD-10-CM | POA: Diagnosis not present

## 2024-04-23 DIAGNOSIS — N178 Other acute kidney failure: Secondary | ICD-10-CM | POA: Diagnosis not present

## 2024-04-23 DIAGNOSIS — E7849 Other hyperlipidemia: Secondary | ICD-10-CM | POA: Diagnosis not present

## 2024-04-23 DIAGNOSIS — G894 Chronic pain syndrome: Secondary | ICD-10-CM | POA: Diagnosis not present

## 2024-04-23 DIAGNOSIS — S3991XA Unspecified injury of abdomen, initial encounter: Secondary | ICD-10-CM | POA: Diagnosis not present

## 2024-04-23 DIAGNOSIS — E1122 Type 2 diabetes mellitus with diabetic chronic kidney disease: Secondary | ICD-10-CM | POA: Diagnosis not present

## 2024-04-23 DIAGNOSIS — S299XXA Unspecified injury of thorax, initial encounter: Secondary | ICD-10-CM | POA: Diagnosis not present

## 2024-04-24 DIAGNOSIS — R1030 Lower abdominal pain, unspecified: Secondary | ICD-10-CM | POA: Diagnosis not present

## 2024-04-24 DIAGNOSIS — Z96651 Presence of right artificial knee joint: Secondary | ICD-10-CM | POA: Diagnosis not present

## 2024-04-24 DIAGNOSIS — Z96641 Presence of right artificial hip joint: Secondary | ICD-10-CM | POA: Diagnosis not present

## 2024-04-24 DIAGNOSIS — M25551 Pain in right hip: Secondary | ICD-10-CM | POA: Diagnosis not present

## 2024-04-25 ENCOUNTER — Ambulatory Visit: Admitting: Family

## 2024-04-25 DIAGNOSIS — R0902 Hypoxemia: Secondary | ICD-10-CM | POA: Diagnosis not present

## 2024-04-25 DIAGNOSIS — J9811 Atelectasis: Secondary | ICD-10-CM | POA: Diagnosis not present

## 2024-04-25 DIAGNOSIS — R0989 Other specified symptoms and signs involving the circulatory and respiratory systems: Secondary | ICD-10-CM | POA: Diagnosis not present

## 2024-04-25 DIAGNOSIS — R0602 Shortness of breath: Secondary | ICD-10-CM | POA: Diagnosis not present

## 2024-04-28 ENCOUNTER — Encounter (INDEPENDENT_AMBULATORY_CARE_PROVIDER_SITE_OTHER): Admitting: Otolaryngology

## 2024-04-29 ENCOUNTER — Telehealth: Payer: Self-pay | Admitting: Adult Health

## 2024-04-29 ENCOUNTER — Other Ambulatory Visit (HOSPITAL_BASED_OUTPATIENT_CLINIC_OR_DEPARTMENT_OTHER): Payer: Self-pay

## 2024-04-29 DIAGNOSIS — Z0000 Encounter for general adult medical examination without abnormal findings: Secondary | ICD-10-CM | POA: Diagnosis present

## 2024-04-29 DIAGNOSIS — M1000 Idiopathic gout, unspecified site: Secondary | ICD-10-CM | POA: Diagnosis not present

## 2024-04-29 DIAGNOSIS — I491 Atrial premature depolarization: Secondary | ICD-10-CM | POA: Diagnosis not present

## 2024-04-29 DIAGNOSIS — R1314 Dysphagia, pharyngoesophageal phase: Secondary | ICD-10-CM | POA: Diagnosis not present

## 2024-04-29 DIAGNOSIS — R2681 Unsteadiness on feet: Secondary | ICD-10-CM | POA: Diagnosis not present

## 2024-04-29 DIAGNOSIS — G894 Chronic pain syndrome: Secondary | ICD-10-CM | POA: Diagnosis not present

## 2024-04-29 DIAGNOSIS — Z96653 Presence of artificial knee joint, bilateral: Secondary | ICD-10-CM | POA: Diagnosis not present

## 2024-04-29 DIAGNOSIS — R262 Difficulty in walking, not elsewhere classified: Secondary | ICD-10-CM | POA: Diagnosis not present

## 2024-04-29 DIAGNOSIS — N178 Other acute kidney failure: Secondary | ICD-10-CM | POA: Diagnosis not present

## 2024-04-29 DIAGNOSIS — R41841 Cognitive communication deficit: Secondary | ICD-10-CM | POA: Diagnosis not present

## 2024-04-29 DIAGNOSIS — M549 Dorsalgia, unspecified: Secondary | ICD-10-CM | POA: Diagnosis not present

## 2024-04-29 DIAGNOSIS — G4733 Obstructive sleep apnea (adult) (pediatric): Secondary | ICD-10-CM | POA: Diagnosis not present

## 2024-04-29 DIAGNOSIS — R0602 Shortness of breath: Secondary | ICD-10-CM | POA: Diagnosis not present

## 2024-04-29 DIAGNOSIS — Z79899 Other long term (current) drug therapy: Secondary | ICD-10-CM | POA: Diagnosis not present

## 2024-04-29 DIAGNOSIS — J449 Chronic obstructive pulmonary disease, unspecified: Secondary | ICD-10-CM | POA: Diagnosis not present

## 2024-04-29 DIAGNOSIS — Z743 Need for continuous supervision: Secondary | ICD-10-CM | POA: Diagnosis not present

## 2024-04-29 DIAGNOSIS — R5381 Other malaise: Secondary | ICD-10-CM | POA: Diagnosis not present

## 2024-04-29 DIAGNOSIS — R001 Bradycardia, unspecified: Secondary | ICD-10-CM | POA: Diagnosis not present

## 2024-04-29 DIAGNOSIS — R531 Weakness: Secondary | ICD-10-CM | POA: Diagnosis not present

## 2024-04-29 DIAGNOSIS — Z87891 Personal history of nicotine dependence: Secondary | ICD-10-CM | POA: Diagnosis not present

## 2024-04-29 DIAGNOSIS — N200 Calculus of kidney: Secondary | ICD-10-CM | POA: Diagnosis not present

## 2024-04-29 DIAGNOSIS — Z96642 Presence of left artificial hip joint: Secondary | ICD-10-CM | POA: Diagnosis not present

## 2024-04-29 DIAGNOSIS — M6281 Muscle weakness (generalized): Secondary | ICD-10-CM | POA: Diagnosis not present

## 2024-04-29 DIAGNOSIS — E7849 Other hyperlipidemia: Secondary | ICD-10-CM | POA: Diagnosis not present

## 2024-04-29 DIAGNOSIS — I4510 Unspecified right bundle-branch block: Secondary | ICD-10-CM | POA: Diagnosis not present

## 2024-04-29 DIAGNOSIS — Z85828 Personal history of other malignant neoplasm of skin: Secondary | ICD-10-CM | POA: Diagnosis not present

## 2024-04-29 DIAGNOSIS — E039 Hypothyroidism, unspecified: Secondary | ICD-10-CM | POA: Diagnosis not present

## 2024-04-29 DIAGNOSIS — R42 Dizziness and giddiness: Secondary | ICD-10-CM | POA: Diagnosis not present

## 2024-04-29 DIAGNOSIS — I213 ST elevation (STEMI) myocardial infarction of unspecified site: Secondary | ICD-10-CM | POA: Diagnosis not present

## 2024-04-29 DIAGNOSIS — I10 Essential (primary) hypertension: Secondary | ICD-10-CM | POA: Diagnosis not present

## 2024-04-29 DIAGNOSIS — K5900 Constipation, unspecified: Secondary | ICD-10-CM | POA: Diagnosis not present

## 2024-04-29 DIAGNOSIS — E119 Type 2 diabetes mellitus without complications: Secondary | ICD-10-CM | POA: Diagnosis not present

## 2024-04-29 MED ORDER — OMEPRAZOLE 20 MG PO CPDR
20.0000 mg | DELAYED_RELEASE_CAPSULE | Freq: Every morning | ORAL | 0 refills | Status: DC
  Filled 2024-04-29: qty 90, 90d supply, fill #0

## 2024-04-29 MED ORDER — BENZONATATE 200 MG PO CAPS
200.0000 mg | ORAL_CAPSULE | Freq: Three times a day (TID) | ORAL | 0 refills | Status: DC | PRN
  Filled 2024-04-29: qty 20, 7d supply, fill #0

## 2024-04-29 MED ORDER — LEVOTHYROXINE SODIUM 100 MCG PO TABS
100.0000 ug | ORAL_TABLET | Freq: Every morning | ORAL | 0 refills | Status: DC
  Filled 2024-04-29: qty 90, 90d supply, fill #0

## 2024-04-29 MED ORDER — SENNOSIDES 8.6 MG PO TABS
1.0000 | ORAL_TABLET | Freq: Every day | ORAL | 0 refills | Status: DC
  Filled 2024-04-29: qty 100, 100d supply, fill #0

## 2024-04-29 MED ORDER — ONDANSETRON 4 MG PO TBDP
4.0000 mg | ORAL_TABLET | Freq: Four times a day (QID) | ORAL | 0 refills | Status: DC | PRN
  Filled 2024-04-29: qty 20, 5d supply, fill #0

## 2024-04-29 NOTE — Telephone Encounter (Signed)
Appointment not needed at this time.

## 2024-04-30 DIAGNOSIS — N178 Other acute kidney failure: Secondary | ICD-10-CM | POA: Diagnosis not present

## 2024-04-30 DIAGNOSIS — E7849 Other hyperlipidemia: Secondary | ICD-10-CM | POA: Diagnosis not present

## 2024-04-30 DIAGNOSIS — E039 Hypothyroidism, unspecified: Secondary | ICD-10-CM | POA: Diagnosis not present

## 2024-04-30 DIAGNOSIS — G4733 Obstructive sleep apnea (adult) (pediatric): Secondary | ICD-10-CM | POA: Diagnosis not present

## 2024-04-30 DIAGNOSIS — E119 Type 2 diabetes mellitus without complications: Secondary | ICD-10-CM | POA: Diagnosis not present

## 2024-04-30 DIAGNOSIS — G894 Chronic pain syndrome: Secondary | ICD-10-CM | POA: Diagnosis not present

## 2024-04-30 DIAGNOSIS — R5381 Other malaise: Secondary | ICD-10-CM | POA: Diagnosis not present

## 2024-04-30 DIAGNOSIS — M1000 Idiopathic gout, unspecified site: Secondary | ICD-10-CM | POA: Diagnosis not present

## 2024-04-30 DIAGNOSIS — I10 Essential (primary) hypertension: Secondary | ICD-10-CM | POA: Diagnosis not present

## 2024-05-01 DIAGNOSIS — G4733 Obstructive sleep apnea (adult) (pediatric): Secondary | ICD-10-CM | POA: Diagnosis not present

## 2024-05-01 DIAGNOSIS — N178 Other acute kidney failure: Secondary | ICD-10-CM | POA: Diagnosis not present

## 2024-05-01 DIAGNOSIS — R5381 Other malaise: Secondary | ICD-10-CM | POA: Diagnosis not present

## 2024-05-01 DIAGNOSIS — E7849 Other hyperlipidemia: Secondary | ICD-10-CM | POA: Diagnosis not present

## 2024-05-01 DIAGNOSIS — E039 Hypothyroidism, unspecified: Secondary | ICD-10-CM | POA: Diagnosis not present

## 2024-05-01 DIAGNOSIS — G894 Chronic pain syndrome: Secondary | ICD-10-CM | POA: Diagnosis not present

## 2024-05-01 DIAGNOSIS — M1000 Idiopathic gout, unspecified site: Secondary | ICD-10-CM | POA: Diagnosis not present

## 2024-05-01 DIAGNOSIS — I10 Essential (primary) hypertension: Secondary | ICD-10-CM | POA: Diagnosis not present

## 2024-05-01 DIAGNOSIS — E119 Type 2 diabetes mellitus without complications: Secondary | ICD-10-CM | POA: Diagnosis not present

## 2024-05-02 DIAGNOSIS — E039 Hypothyroidism, unspecified: Secondary | ICD-10-CM | POA: Diagnosis not present

## 2024-05-02 DIAGNOSIS — M1000 Idiopathic gout, unspecified site: Secondary | ICD-10-CM | POA: Diagnosis not present

## 2024-05-02 DIAGNOSIS — R5381 Other malaise: Secondary | ICD-10-CM | POA: Diagnosis not present

## 2024-05-02 DIAGNOSIS — G894 Chronic pain syndrome: Secondary | ICD-10-CM | POA: Diagnosis not present

## 2024-05-02 DIAGNOSIS — N178 Other acute kidney failure: Secondary | ICD-10-CM | POA: Diagnosis not present

## 2024-05-02 DIAGNOSIS — I10 Essential (primary) hypertension: Secondary | ICD-10-CM | POA: Diagnosis not present

## 2024-05-02 DIAGNOSIS — G4733 Obstructive sleep apnea (adult) (pediatric): Secondary | ICD-10-CM | POA: Diagnosis not present

## 2024-05-02 DIAGNOSIS — E7849 Other hyperlipidemia: Secondary | ICD-10-CM | POA: Diagnosis not present

## 2024-05-02 DIAGNOSIS — E119 Type 2 diabetes mellitus without complications: Secondary | ICD-10-CM | POA: Diagnosis not present

## 2024-05-05 ENCOUNTER — Ambulatory Visit: Payer: Medicare HMO | Admitting: Adult Health

## 2024-05-05 DIAGNOSIS — E039 Hypothyroidism, unspecified: Secondary | ICD-10-CM | POA: Diagnosis not present

## 2024-05-05 DIAGNOSIS — N178 Other acute kidney failure: Secondary | ICD-10-CM | POA: Diagnosis not present

## 2024-05-05 DIAGNOSIS — E119 Type 2 diabetes mellitus without complications: Secondary | ICD-10-CM | POA: Diagnosis not present

## 2024-05-05 DIAGNOSIS — R5381 Other malaise: Secondary | ICD-10-CM | POA: Diagnosis not present

## 2024-05-05 DIAGNOSIS — E7849 Other hyperlipidemia: Secondary | ICD-10-CM | POA: Diagnosis not present

## 2024-05-05 DIAGNOSIS — G894 Chronic pain syndrome: Secondary | ICD-10-CM | POA: Diagnosis not present

## 2024-05-05 DIAGNOSIS — M1000 Idiopathic gout, unspecified site: Secondary | ICD-10-CM | POA: Diagnosis not present

## 2024-05-05 DIAGNOSIS — G4733 Obstructive sleep apnea (adult) (pediatric): Secondary | ICD-10-CM | POA: Diagnosis not present

## 2024-05-05 DIAGNOSIS — I10 Essential (primary) hypertension: Secondary | ICD-10-CM | POA: Diagnosis not present

## 2024-05-07 ENCOUNTER — Inpatient Hospital Stay (HOSPITAL_BASED_OUTPATIENT_CLINIC_OR_DEPARTMENT_OTHER): Admission: RE | Admit: 2024-05-07 | Source: Ambulatory Visit

## 2024-05-07 DIAGNOSIS — I10 Essential (primary) hypertension: Secondary | ICD-10-CM | POA: Diagnosis not present

## 2024-05-07 DIAGNOSIS — G4733 Obstructive sleep apnea (adult) (pediatric): Secondary | ICD-10-CM | POA: Diagnosis not present

## 2024-05-07 DIAGNOSIS — E119 Type 2 diabetes mellitus without complications: Secondary | ICD-10-CM | POA: Diagnosis not present

## 2024-05-07 DIAGNOSIS — E039 Hypothyroidism, unspecified: Secondary | ICD-10-CM | POA: Diagnosis not present

## 2024-05-07 DIAGNOSIS — M1000 Idiopathic gout, unspecified site: Secondary | ICD-10-CM | POA: Diagnosis not present

## 2024-05-07 DIAGNOSIS — E7849 Other hyperlipidemia: Secondary | ICD-10-CM | POA: Diagnosis not present

## 2024-05-07 DIAGNOSIS — R5381 Other malaise: Secondary | ICD-10-CM | POA: Diagnosis not present

## 2024-05-07 DIAGNOSIS — G894 Chronic pain syndrome: Secondary | ICD-10-CM | POA: Diagnosis not present

## 2024-05-07 DIAGNOSIS — N178 Other acute kidney failure: Secondary | ICD-10-CM | POA: Diagnosis not present

## 2024-05-08 DIAGNOSIS — I10 Essential (primary) hypertension: Secondary | ICD-10-CM | POA: Diagnosis not present

## 2024-05-08 DIAGNOSIS — G4733 Obstructive sleep apnea (adult) (pediatric): Secondary | ICD-10-CM | POA: Diagnosis not present

## 2024-05-08 DIAGNOSIS — R5381 Other malaise: Secondary | ICD-10-CM | POA: Diagnosis not present

## 2024-05-08 DIAGNOSIS — E7849 Other hyperlipidemia: Secondary | ICD-10-CM | POA: Diagnosis not present

## 2024-05-08 DIAGNOSIS — N178 Other acute kidney failure: Secondary | ICD-10-CM | POA: Diagnosis not present

## 2024-05-08 DIAGNOSIS — E119 Type 2 diabetes mellitus without complications: Secondary | ICD-10-CM | POA: Diagnosis not present

## 2024-05-08 DIAGNOSIS — E039 Hypothyroidism, unspecified: Secondary | ICD-10-CM | POA: Diagnosis not present

## 2024-05-08 DIAGNOSIS — G894 Chronic pain syndrome: Secondary | ICD-10-CM | POA: Diagnosis not present

## 2024-05-08 DIAGNOSIS — M1000 Idiopathic gout, unspecified site: Secondary | ICD-10-CM | POA: Diagnosis not present

## 2024-05-09 ENCOUNTER — Other Ambulatory Visit (HOSPITAL_BASED_OUTPATIENT_CLINIC_OR_DEPARTMENT_OTHER): Payer: Self-pay

## 2024-05-09 DIAGNOSIS — E7849 Other hyperlipidemia: Secondary | ICD-10-CM | POA: Diagnosis not present

## 2024-05-09 DIAGNOSIS — G4733 Obstructive sleep apnea (adult) (pediatric): Secondary | ICD-10-CM | POA: Diagnosis not present

## 2024-05-09 DIAGNOSIS — R5381 Other malaise: Secondary | ICD-10-CM | POA: Diagnosis not present

## 2024-05-09 DIAGNOSIS — M1000 Idiopathic gout, unspecified site: Secondary | ICD-10-CM | POA: Diagnosis not present

## 2024-05-09 DIAGNOSIS — G894 Chronic pain syndrome: Secondary | ICD-10-CM | POA: Diagnosis not present

## 2024-05-09 DIAGNOSIS — N178 Other acute kidney failure: Secondary | ICD-10-CM | POA: Diagnosis not present

## 2024-05-09 DIAGNOSIS — I10 Essential (primary) hypertension: Secondary | ICD-10-CM | POA: Diagnosis not present

## 2024-05-09 DIAGNOSIS — E119 Type 2 diabetes mellitus without complications: Secondary | ICD-10-CM | POA: Diagnosis not present

## 2024-05-09 DIAGNOSIS — E039 Hypothyroidism, unspecified: Secondary | ICD-10-CM | POA: Diagnosis not present

## 2024-05-12 DIAGNOSIS — I10 Essential (primary) hypertension: Secondary | ICD-10-CM | POA: Diagnosis not present

## 2024-05-12 DIAGNOSIS — R5381 Other malaise: Secondary | ICD-10-CM | POA: Diagnosis not present

## 2024-05-12 DIAGNOSIS — G894 Chronic pain syndrome: Secondary | ICD-10-CM | POA: Diagnosis not present

## 2024-05-12 DIAGNOSIS — E119 Type 2 diabetes mellitus without complications: Secondary | ICD-10-CM | POA: Diagnosis not present

## 2024-05-12 DIAGNOSIS — G4733 Obstructive sleep apnea (adult) (pediatric): Secondary | ICD-10-CM | POA: Diagnosis not present

## 2024-05-12 DIAGNOSIS — E7849 Other hyperlipidemia: Secondary | ICD-10-CM | POA: Diagnosis not present

## 2024-05-12 DIAGNOSIS — M1000 Idiopathic gout, unspecified site: Secondary | ICD-10-CM | POA: Diagnosis not present

## 2024-05-12 DIAGNOSIS — N178 Other acute kidney failure: Secondary | ICD-10-CM | POA: Diagnosis not present

## 2024-05-12 DIAGNOSIS — E039 Hypothyroidism, unspecified: Secondary | ICD-10-CM | POA: Diagnosis not present

## 2024-05-13 ENCOUNTER — Other Ambulatory Visit: Payer: Self-pay | Admitting: Licensed Clinical Social Worker

## 2024-05-13 ENCOUNTER — Telehealth: Payer: Self-pay

## 2024-05-13 DIAGNOSIS — E119 Type 2 diabetes mellitus without complications: Secondary | ICD-10-CM | POA: Diagnosis not present

## 2024-05-13 DIAGNOSIS — G894 Chronic pain syndrome: Secondary | ICD-10-CM | POA: Diagnosis not present

## 2024-05-13 DIAGNOSIS — R5381 Other malaise: Secondary | ICD-10-CM | POA: Diagnosis not present

## 2024-05-13 DIAGNOSIS — E039 Hypothyroidism, unspecified: Secondary | ICD-10-CM | POA: Diagnosis not present

## 2024-05-13 DIAGNOSIS — N178 Other acute kidney failure: Secondary | ICD-10-CM | POA: Diagnosis not present

## 2024-05-13 DIAGNOSIS — I10 Essential (primary) hypertension: Secondary | ICD-10-CM | POA: Diagnosis not present

## 2024-05-13 DIAGNOSIS — G4733 Obstructive sleep apnea (adult) (pediatric): Secondary | ICD-10-CM | POA: Diagnosis not present

## 2024-05-13 DIAGNOSIS — E7849 Other hyperlipidemia: Secondary | ICD-10-CM | POA: Diagnosis not present

## 2024-05-13 DIAGNOSIS — M1000 Idiopathic gout, unspecified site: Secondary | ICD-10-CM | POA: Diagnosis not present

## 2024-05-13 NOTE — Telephone Encounter (Signed)
 Information given to patient's wife to let him know we can not order this while he is in the rehab facility. This is an outpatient test and we can wait and get it done once he is finished with rehab.

## 2024-05-13 NOTE — Telephone Encounter (Signed)
 Copied from CRM #[id number]. Topic: General - Other >> May 13, 2024 10:36 AM Treva T wrote: Reason for CRM: Patient/patient representative is calling for information regarding an appointment.   Received call from patient, to verify DEXA scan appointment.  Per appointment, DEXA imaging appointment is 06/10/24 at 1:30 pm.   Patient states he is currently at the hospital in [facility], and wants to know if he can have DEXA scan performed there, while in hospital.  Patient can be reached at [phone], or [phone] to discuss further.

## 2024-05-14 DIAGNOSIS — N178 Other acute kidney failure: Secondary | ICD-10-CM | POA: Diagnosis not present

## 2024-05-14 DIAGNOSIS — E119 Type 2 diabetes mellitus without complications: Secondary | ICD-10-CM | POA: Diagnosis not present

## 2024-05-14 DIAGNOSIS — G894 Chronic pain syndrome: Secondary | ICD-10-CM | POA: Diagnosis not present

## 2024-05-14 DIAGNOSIS — E039 Hypothyroidism, unspecified: Secondary | ICD-10-CM | POA: Diagnosis not present

## 2024-05-14 DIAGNOSIS — E7849 Other hyperlipidemia: Secondary | ICD-10-CM | POA: Diagnosis not present

## 2024-05-14 DIAGNOSIS — I10 Essential (primary) hypertension: Secondary | ICD-10-CM | POA: Diagnosis not present

## 2024-05-14 DIAGNOSIS — M1000 Idiopathic gout, unspecified site: Secondary | ICD-10-CM | POA: Diagnosis not present

## 2024-05-14 DIAGNOSIS — G4733 Obstructive sleep apnea (adult) (pediatric): Secondary | ICD-10-CM | POA: Diagnosis not present

## 2024-05-14 DIAGNOSIS — R5381 Other malaise: Secondary | ICD-10-CM | POA: Diagnosis not present

## 2024-05-15 DIAGNOSIS — G894 Chronic pain syndrome: Secondary | ICD-10-CM | POA: Diagnosis not present

## 2024-05-15 DIAGNOSIS — M1000 Idiopathic gout, unspecified site: Secondary | ICD-10-CM | POA: Diagnosis not present

## 2024-05-15 DIAGNOSIS — E7849 Other hyperlipidemia: Secondary | ICD-10-CM | POA: Diagnosis not present

## 2024-05-15 DIAGNOSIS — N178 Other acute kidney failure: Secondary | ICD-10-CM | POA: Diagnosis not present

## 2024-05-15 DIAGNOSIS — E119 Type 2 diabetes mellitus without complications: Secondary | ICD-10-CM | POA: Diagnosis not present

## 2024-05-15 DIAGNOSIS — R5381 Other malaise: Secondary | ICD-10-CM | POA: Diagnosis not present

## 2024-05-15 DIAGNOSIS — E039 Hypothyroidism, unspecified: Secondary | ICD-10-CM | POA: Diagnosis not present

## 2024-05-15 DIAGNOSIS — I10 Essential (primary) hypertension: Secondary | ICD-10-CM | POA: Diagnosis not present

## 2024-05-15 DIAGNOSIS — G4733 Obstructive sleep apnea (adult) (pediatric): Secondary | ICD-10-CM | POA: Diagnosis not present

## 2024-05-16 DIAGNOSIS — N178 Other acute kidney failure: Secondary | ICD-10-CM | POA: Diagnosis not present

## 2024-05-16 DIAGNOSIS — G894 Chronic pain syndrome: Secondary | ICD-10-CM | POA: Diagnosis not present

## 2024-05-16 DIAGNOSIS — R5381 Other malaise: Secondary | ICD-10-CM | POA: Diagnosis not present

## 2024-05-16 DIAGNOSIS — E119 Type 2 diabetes mellitus without complications: Secondary | ICD-10-CM | POA: Diagnosis not present

## 2024-05-16 DIAGNOSIS — I10 Essential (primary) hypertension: Secondary | ICD-10-CM | POA: Diagnosis not present

## 2024-05-16 DIAGNOSIS — G4733 Obstructive sleep apnea (adult) (pediatric): Secondary | ICD-10-CM | POA: Diagnosis not present

## 2024-05-16 DIAGNOSIS — E7849 Other hyperlipidemia: Secondary | ICD-10-CM | POA: Diagnosis not present

## 2024-05-16 DIAGNOSIS — E039 Hypothyroidism, unspecified: Secondary | ICD-10-CM | POA: Diagnosis not present

## 2024-05-16 DIAGNOSIS — M1000 Idiopathic gout, unspecified site: Secondary | ICD-10-CM | POA: Diagnosis not present

## 2024-05-19 DIAGNOSIS — E7849 Other hyperlipidemia: Secondary | ICD-10-CM | POA: Diagnosis not present

## 2024-05-19 DIAGNOSIS — I10 Essential (primary) hypertension: Secondary | ICD-10-CM | POA: Diagnosis not present

## 2024-05-19 DIAGNOSIS — G4733 Obstructive sleep apnea (adult) (pediatric): Secondary | ICD-10-CM | POA: Diagnosis not present

## 2024-05-19 DIAGNOSIS — M1000 Idiopathic gout, unspecified site: Secondary | ICD-10-CM | POA: Diagnosis not present

## 2024-05-19 DIAGNOSIS — G894 Chronic pain syndrome: Secondary | ICD-10-CM | POA: Diagnosis not present

## 2024-05-19 DIAGNOSIS — E119 Type 2 diabetes mellitus without complications: Secondary | ICD-10-CM | POA: Diagnosis not present

## 2024-05-19 DIAGNOSIS — N178 Other acute kidney failure: Secondary | ICD-10-CM | POA: Diagnosis not present

## 2024-05-19 DIAGNOSIS — E039 Hypothyroidism, unspecified: Secondary | ICD-10-CM | POA: Diagnosis not present

## 2024-05-19 DIAGNOSIS — R5381 Other malaise: Secondary | ICD-10-CM | POA: Diagnosis not present

## 2024-05-20 DIAGNOSIS — M1000 Idiopathic gout, unspecified site: Secondary | ICD-10-CM | POA: Diagnosis not present

## 2024-05-20 DIAGNOSIS — E119 Type 2 diabetes mellitus without complications: Secondary | ICD-10-CM | POA: Diagnosis not present

## 2024-05-20 DIAGNOSIS — I10 Essential (primary) hypertension: Secondary | ICD-10-CM | POA: Diagnosis not present

## 2024-05-20 DIAGNOSIS — R5381 Other malaise: Secondary | ICD-10-CM | POA: Diagnosis not present

## 2024-05-20 DIAGNOSIS — G894 Chronic pain syndrome: Secondary | ICD-10-CM | POA: Diagnosis not present

## 2024-05-20 DIAGNOSIS — G4733 Obstructive sleep apnea (adult) (pediatric): Secondary | ICD-10-CM | POA: Diagnosis not present

## 2024-05-20 DIAGNOSIS — E7849 Other hyperlipidemia: Secondary | ICD-10-CM | POA: Diagnosis not present

## 2024-05-20 DIAGNOSIS — N178 Other acute kidney failure: Secondary | ICD-10-CM | POA: Diagnosis not present

## 2024-05-20 DIAGNOSIS — E039 Hypothyroidism, unspecified: Secondary | ICD-10-CM | POA: Diagnosis not present

## 2024-05-21 DIAGNOSIS — M5412 Radiculopathy, cervical region: Secondary | ICD-10-CM | POA: Diagnosis not present

## 2024-05-21 DIAGNOSIS — E039 Hypothyroidism, unspecified: Secondary | ICD-10-CM | POA: Diagnosis not present

## 2024-05-21 DIAGNOSIS — R5381 Other malaise: Secondary | ICD-10-CM | POA: Diagnosis not present

## 2024-05-21 DIAGNOSIS — M1000 Idiopathic gout, unspecified site: Secondary | ICD-10-CM | POA: Diagnosis not present

## 2024-05-21 DIAGNOSIS — N178 Other acute kidney failure: Secondary | ICD-10-CM | POA: Diagnosis not present

## 2024-05-21 DIAGNOSIS — E7849 Other hyperlipidemia: Secondary | ICD-10-CM | POA: Diagnosis not present

## 2024-05-21 DIAGNOSIS — I10 Essential (primary) hypertension: Secondary | ICD-10-CM | POA: Diagnosis not present

## 2024-05-21 DIAGNOSIS — Z6825 Body mass index (BMI) 25.0-25.9, adult: Secondary | ICD-10-CM | POA: Diagnosis not present

## 2024-05-21 DIAGNOSIS — G894 Chronic pain syndrome: Secondary | ICD-10-CM | POA: Diagnosis not present

## 2024-05-21 DIAGNOSIS — M48062 Spinal stenosis, lumbar region with neurogenic claudication: Secondary | ICD-10-CM | POA: Diagnosis not present

## 2024-05-21 DIAGNOSIS — G4733 Obstructive sleep apnea (adult) (pediatric): Secondary | ICD-10-CM | POA: Diagnosis not present

## 2024-05-21 DIAGNOSIS — E119 Type 2 diabetes mellitus without complications: Secondary | ICD-10-CM | POA: Diagnosis not present

## 2024-05-22 DIAGNOSIS — G4733 Obstructive sleep apnea (adult) (pediatric): Secondary | ICD-10-CM | POA: Diagnosis not present

## 2024-05-22 DIAGNOSIS — I10 Essential (primary) hypertension: Secondary | ICD-10-CM | POA: Diagnosis not present

## 2024-05-22 DIAGNOSIS — N178 Other acute kidney failure: Secondary | ICD-10-CM | POA: Diagnosis not present

## 2024-05-22 DIAGNOSIS — E7849 Other hyperlipidemia: Secondary | ICD-10-CM | POA: Diagnosis not present

## 2024-05-22 DIAGNOSIS — M1000 Idiopathic gout, unspecified site: Secondary | ICD-10-CM | POA: Diagnosis not present

## 2024-05-22 DIAGNOSIS — G894 Chronic pain syndrome: Secondary | ICD-10-CM | POA: Diagnosis not present

## 2024-05-22 DIAGNOSIS — R5381 Other malaise: Secondary | ICD-10-CM | POA: Diagnosis not present

## 2024-05-22 DIAGNOSIS — E039 Hypothyroidism, unspecified: Secondary | ICD-10-CM | POA: Diagnosis not present

## 2024-05-22 DIAGNOSIS — E119 Type 2 diabetes mellitus without complications: Secondary | ICD-10-CM | POA: Diagnosis not present

## 2024-05-23 DIAGNOSIS — R5381 Other malaise: Secondary | ICD-10-CM | POA: Diagnosis not present

## 2024-05-23 DIAGNOSIS — I10 Essential (primary) hypertension: Secondary | ICD-10-CM | POA: Diagnosis not present

## 2024-05-23 DIAGNOSIS — E119 Type 2 diabetes mellitus without complications: Secondary | ICD-10-CM | POA: Diagnosis not present

## 2024-05-23 DIAGNOSIS — N178 Other acute kidney failure: Secondary | ICD-10-CM | POA: Diagnosis not present

## 2024-05-23 DIAGNOSIS — M1000 Idiopathic gout, unspecified site: Secondary | ICD-10-CM | POA: Diagnosis not present

## 2024-05-23 DIAGNOSIS — E039 Hypothyroidism, unspecified: Secondary | ICD-10-CM | POA: Diagnosis not present

## 2024-05-23 DIAGNOSIS — G894 Chronic pain syndrome: Secondary | ICD-10-CM | POA: Diagnosis not present

## 2024-05-23 DIAGNOSIS — G4733 Obstructive sleep apnea (adult) (pediatric): Secondary | ICD-10-CM | POA: Diagnosis not present

## 2024-05-23 DIAGNOSIS — E7849 Other hyperlipidemia: Secondary | ICD-10-CM | POA: Diagnosis not present

## 2024-05-24 ENCOUNTER — Emergency Department (HOSPITAL_COMMUNITY)

## 2024-05-24 ENCOUNTER — Emergency Department (HOSPITAL_COMMUNITY)
Admission: EM | Admit: 2024-05-24 | Discharge: 2024-05-24 | Disposition: A | Discharge status: Home / Self Care | Source: Skilled Nursing Facility | Attending: Emergency Medicine | Admitting: Emergency Medicine

## 2024-05-24 DIAGNOSIS — I4510 Unspecified right bundle-branch block: Secondary | ICD-10-CM | POA: Diagnosis not present

## 2024-05-24 DIAGNOSIS — E7849 Other hyperlipidemia: Secondary | ICD-10-CM | POA: Diagnosis not present

## 2024-05-24 DIAGNOSIS — R0602 Shortness of breath: Secondary | ICD-10-CM | POA: Diagnosis not present

## 2024-05-24 DIAGNOSIS — Z96653 Presence of artificial knee joint, bilateral: Secondary | ICD-10-CM | POA: Insufficient documentation

## 2024-05-24 DIAGNOSIS — N200 Calculus of kidney: Secondary | ICD-10-CM | POA: Diagnosis not present

## 2024-05-24 DIAGNOSIS — Z0000 Encounter for general adult medical examination without abnormal findings: Secondary | ICD-10-CM | POA: Diagnosis not present

## 2024-05-24 DIAGNOSIS — I10 Essential (primary) hypertension: Secondary | ICD-10-CM | POA: Insufficient documentation

## 2024-05-24 DIAGNOSIS — R001 Bradycardia, unspecified: Secondary | ICD-10-CM | POA: Diagnosis not present

## 2024-05-24 DIAGNOSIS — K5900 Constipation, unspecified: Secondary | ICD-10-CM | POA: Diagnosis not present

## 2024-05-24 DIAGNOSIS — Z85828 Personal history of other malignant neoplasm of skin: Secondary | ICD-10-CM | POA: Insufficient documentation

## 2024-05-24 DIAGNOSIS — Z96642 Presence of left artificial hip joint: Secondary | ICD-10-CM | POA: Diagnosis not present

## 2024-05-24 DIAGNOSIS — M1000 Idiopathic gout, unspecified site: Secondary | ICD-10-CM | POA: Diagnosis not present

## 2024-05-24 DIAGNOSIS — J449 Chronic obstructive pulmonary disease, unspecified: Secondary | ICD-10-CM | POA: Diagnosis not present

## 2024-05-24 DIAGNOSIS — I491 Atrial premature depolarization: Secondary | ICD-10-CM | POA: Diagnosis not present

## 2024-05-24 DIAGNOSIS — E119 Type 2 diabetes mellitus without complications: Secondary | ICD-10-CM | POA: Insufficient documentation

## 2024-05-24 DIAGNOSIS — Z79899 Other long term (current) drug therapy: Secondary | ICD-10-CM | POA: Diagnosis not present

## 2024-05-24 DIAGNOSIS — I213 ST elevation (STEMI) myocardial infarction of unspecified site: Secondary | ICD-10-CM | POA: Diagnosis not present

## 2024-05-24 DIAGNOSIS — Z87891 Personal history of nicotine dependence: Secondary | ICD-10-CM | POA: Diagnosis not present

## 2024-05-24 DIAGNOSIS — E039 Hypothyroidism, unspecified: Secondary | ICD-10-CM | POA: Diagnosis not present

## 2024-05-24 DIAGNOSIS — G894 Chronic pain syndrome: Secondary | ICD-10-CM | POA: Diagnosis not present

## 2024-05-24 DIAGNOSIS — R5381 Other malaise: Secondary | ICD-10-CM | POA: Diagnosis not present

## 2024-05-24 DIAGNOSIS — R42 Dizziness and giddiness: Secondary | ICD-10-CM | POA: Diagnosis not present

## 2024-05-24 DIAGNOSIS — N178 Other acute kidney failure: Secondary | ICD-10-CM | POA: Diagnosis not present

## 2024-05-24 DIAGNOSIS — G4733 Obstructive sleep apnea (adult) (pediatric): Secondary | ICD-10-CM | POA: Diagnosis not present

## 2024-05-24 LAB — BASIC METABOLIC PANEL WITH GFR
Anion gap: 14 (ref 5–15)
BUN: 22 mg/dL (ref 8–23)
CO2: 24 mmol/L (ref 22–32)
Calcium: 9.3 mg/dL (ref 8.9–10.3)
Chloride: 102 mmol/L (ref 98–111)
Creatinine, Ser: 1.37 mg/dL — ABNORMAL HIGH (ref 0.61–1.24)
GFR, Estimated: 51 mL/min — ABNORMAL LOW (ref >60)
Glucose, Bld: 164 mg/dL — ABNORMAL HIGH (ref 70–99)
Potassium: 4 mmol/L (ref 3.5–5.1)
Sodium: 139 mmol/L (ref 135–145)

## 2024-05-24 LAB — CBC WITH DIFFERENTIAL/PLATELET
Abs Immature Granulocytes: 0.05 K/uL (ref 0.00–0.07)
Basophils Absolute: 0 K/uL (ref 0.0–0.1)
Basophils Relative: 0 %
Eosinophils Absolute: 0.2 K/uL (ref 0.0–0.5)
Eosinophils Relative: 2 %
HCT: 39 % (ref 39.0–52.0)
Hemoglobin: 12.4 g/dL — ABNORMAL LOW (ref 13.0–17.0)
Immature Granulocytes: 0 %
Lymphocytes Relative: 9 %
Lymphs Abs: 1.1 K/uL (ref 0.7–4.0)
MCH: 32 pg (ref 26.0–34.0)
MCHC: 31.8 g/dL (ref 30.0–36.0)
MCV: 100.5 fL — ABNORMAL HIGH (ref 80.0–100.0)
Monocytes Absolute: 0.5 K/uL (ref 0.1–1.0)
Monocytes Relative: 4 %
Neutro Abs: 10.4 K/uL — ABNORMAL HIGH (ref 1.7–7.7)
Neutrophils Relative %: 85 %
Platelets: 361 K/uL (ref 150–400)
RBC: 3.88 MIL/uL — ABNORMAL LOW (ref 4.22–5.81)
RDW: 14.1 % (ref 11.5–15.5)
WBC: 12.3 K/uL — ABNORMAL HIGH (ref 4.0–10.5)
nRBC: 0 % (ref 0.0–0.2)

## 2024-05-24 MED ORDER — DOCUSATE SODIUM 100 MG PO CAPS
100.0000 mg | ORAL_CAPSULE | Freq: Two times a day (BID) | ORAL | 0 refills | Status: DC
  Filled 2024-05-24: qty 100, 50d supply, fill #0
  Filled 2024-08-19: qty 60, 30d supply, fill #0
  Filled 2024-08-25 (×2): qty 100, 50d supply, fill #0

## 2024-05-24 MED ORDER — SODIUM CHLORIDE 0.9 % IV BOLUS
1000.0000 mL | Freq: Once | INTRAVENOUS | Status: AC
  Administered 2024-05-24: 1000 mL via INTRAVENOUS

## 2024-05-24 MED ORDER — DOCUSATE SODIUM 100 MG PO CAPS: 100.0000 mg | ORAL_CAPSULE | Freq: Two times a day (BID) | ORAL | 0 refills | Status: DC

## 2024-05-24 NOTE — ED Provider Notes (Signed)
 [place] EMERGENCY DEPARTMENT AT [facility] Provider Note  CSN: [id number] Arrival date & time: 05/24/24 1417  Chief Complaint(s) Medical Clearance (Pt present from rehab after his son was picking him up for discharge and did not feel comfortable taking him home d/t to his unsteadiness on feet. Pt uses wheelchair at baseline. )  HPI [name] Pratt is a 83 y.o. male who is here today with his son after some was picking him up from his skilled nursing facility as he had reached the end of his stay there, and the patient was complaining of feeling unwell, unsteady on his feet.  Uses a walker at baseline.   Past Medical History Past Medical History:  Diagnosis Date   Acute kidney injury (HCC) 05/02/2022   Acute metabolic encephalopathy 04/24/2022   Acute respiratory failure with hypoxia (HCC) 09/14/2023   Adenomatous polyp of ascending colon    Adenomatous polyp of colon    Allergic rhinitis 02/02/2016   Anemia 12/17/2009   Formatting of this note might be different from the original. 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 Hypertension   Benign paroxysmal positional vertigo 09/14/2021   Bilateral sacroiliitis (HCC) 07/22/2021   Body mass index (BMI) 25.0-25.9, adult 11/02/2020   Cancer (HCC)    skin cancer   Cardiac murmur 07/25/2021   Carotid stenosis    a. Carotid U/S 5/13: LICA < 50%, RICA 50-69%;  b.  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   Cystoid macular edema of both eyes 07/28/2020   Degenerative disc disease, lumbar 06/30/2015   Deviated nasal septum 02/18/2015   Diverticulosis 03/03/2020   DJD (degenerative joint disease) of hip 12/24/2012   Drowsiness 12/07/2022   Dry eyes 09/14/2023   Dyspnea 06/12/2011   Emphysema, unspecified (HCC) 03/05/2020   Erectile dysfunction 06/20/2017   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   Fall 09/18/2022   Gait disorder 03/02/2021   Gait instability 03/02/2021   GERD (gastroesophageal reflux disease)    GI bleed 04/21/2022   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   Qualifier: Diagnosis of  By: Gwenn Runell     Greater trochanteric pain syndrome 07/22/2021   Hand pain, left 01/16/2022   Hand weakness 05/02/2022   Hearing loss 02/02/2016   Hematochezia    Heme positive stool 02/29/2020   Hemorrhagic shock (HCC)    HIATAL HERNIA 05/13/2009   Qualifier: Diagnosis of   By: Gwenn Runell      Replacing diagnoses that were inactivated after the 12/11/22 regulatory import     High risk medication use 01/18/2012   Hip pain 03/02/2021   History of chicken pox    History of COVID-19 12/05/2021   History of hemorrhoids  History of kidney stones    History of skin cancer 07/22/2021   skin cancer   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  Cardiology - Dr Orlean at [facility] cardiology     ICD-10 cut over     Hyperlipidemia with target LDL less than 130 12/27/2010   Formatting of this note might be different from the original.  Cardiology - Dr Orlean at [facility] cardiology  ICD-10 cut over   Hypokalemia 07/08/2021   Hypothyroidism    Internal hemorrhoids 03/03/2020   Iron  deficiency anemia 09/18/2022   Irregular heart beat 02/03/2022   Leg cramps 11/13/2019   Lower GI bleed 12/12/2012   Lumbago of lumbar region with sciatica 10/21/2020   Lumbar radiculopathy 03/02/2021   Lumbar spondylosis 11/02/2020   NEPHROLITHIASIS 05/13/2009   Qualifier: Diagnosis of  By: Gwenn Grimes     Nonspecific abnormal electrocardiogram (ECG) (EKG) 01/06/2013   Numbness of hand 01/24/2022   Onychomycosis 06/30/2015   OSA (obstructive sleep apnea) 11/25/2012   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 Osteoarthritis Of The Hand  10/1 IMO update   Osteoarthrosis, unspecified whether generalized or localized, pelvic region and thigh 07/25/2010   Formatting of this note might be different from the original. 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  User time 6 hours.  479 days , not used only 12 days  - highly compliant 06-23-13  .    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   Urinary retention 11/26/2020   Vasovagal syncope    Ventral hernia 07/08/2012   Vertigo 07/08/2021   Vitamin D  deficiency 08/07/2022   Weakness of lower extremity 10/10/2022   Weight loss 03/03/2020   Patient Active Problem List   Diagnosis Date Noted   Chronic rhinitis 03/19/2024   Hypertrophy of nasal turbinates 03/19/2024   Postnasal drip 03/19/2024   Subungual hematoma of toenail of right foot 02/22/2024   Osteopenia 02/05/2024   Stage 3b  chronic kidney disease (HCC) 02/05/2024   Fatigue 01/24/2024   Pneumonia of both lower lobes due to infectious organism 09/27/2023   Aspiration pneumonia (HCC) 09/15/2023   Acute respiratory failure with hypoxia (HCC) 09/14/2023   Sinus bradycardia 09/14/2023   Dry eyes 09/14/2023   Spinal stenosis of lumbar region 12/27/2022   Drowsiness 12/07/2022   Preop examination 12/06/2022   Weakness of lower extremity 10/10/2022   Skin tear of forearm without complication, left, subsequent encounter 09/18/2022   Fall 09/18/2022   Iron  deficiency anemia 09/18/2022   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   Vitamin D  deficiency 08/07/2022   Closed fracture of left hip (HCC) 08/07/2022   Hand weakness 05/02/2022   Acute kidney injury (HCC) 05/02/2022   Adenomatous polyp of ascending colon    Adenomatous polyp of colon    Acute metabolic encephalopathy 04/24/2022   GI bleed 04/21/2022   Vasovagal syncope    Hemorrhagic shock (HCC)    Hematochezia    Irregular heart beat 02/03/2022   Constipation  02/03/2022   Carpal tunnel syndrome 01/24/2022   Numbness of hand 01/24/2022   Ulnar neuropathy 01/24/2022   Skin lesion 01/16/2022   History of COVID-19 12/05/2021   Benign paroxysmal positional vertigo 09/14/2021   Palpitations 07/25/2021   Cardiac murmur 07/25/2021   History of chicken pox 07/22/2021   History of hemorrhoids 07/22/2021   COPD (chronic obstructive pulmonary disease) (HCC) 07/22/2021   History of kidney stones 07/22/2021   History of skin cancer 07/22/2021   Complication of anesthesia 07/22/2021   Bilateral sacroiliitis (HCC) 07/22/2021   Other abnormal glucose 07/22/2021   Spondylitis (HCC) 07/22/2021   Greater trochanteric pain syndrome 07/22/2021   Vertigo 07/08/2021   Hypokalemia 07/08/2021   Trochanteric bursitis of left hip 06/30/2021   Cancer (HCC) 04/11/2021   Lumbar radiculopathy 03/02/2021   Gait instability 03/02/2021   Hip pain 03/02/2021   Situational depression 03/02/2021   Gait disorder 03/02/2021   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   Urinary retention 11/26/2020   Cervical myelopathy (HCC) 11/24/2020   Body mass index (BMI) 25.0-25.9, adult 11/02/2020   Lumbar spondylosis 11/02/2020   Left-sided low back pain without sciatica 10/21/2020   Cystoid macular edema of both eyes 07/28/2020   Status post cervical spinal fusion 07/09/2020   History of total bilateral knee replacement 07/08/2020   Aortic atherosclerosis (HCC) 03/05/2020   Emphysema, unspecified (HCC) 03/05/2020   Arthritis 03/04/2020   Diverticulosis 03/03/2020   Internal hemorrhoids 03/03/2020   Heme positive stool 02/29/2020   Leg cramping 11/13/2019   Tibialis anterior tendon tear, nontraumatic 11/13/2019   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 07/29/2018   CPAP (continuous positive airway pressure) dependence 03/25/2018   Erectile dysfunction 06/20/2017   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 03/06/2017   Peripheral neuropathy 06/19/2016   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   Hearing loss 02/02/2016   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   Allergic rhinitis 02/02/2016   Atypical chest pain 11/24/2015   Preventative health care 11/24/2015   Onychomycosis 06/30/2015   Degenerative disc disease, lumbar 06/30/2015   Other allergic rhinitis 02/18/2015   Deviated nasal septum 02/18/2015   RLS (restless legs syndrome) 02/18/2015   GERD (gastroesophageal reflux disease) 09/18/2014   Hyperglycemia 03/03/2014   Rotator cuff tear 07/27/2013   Carotid stenosis 02/24/2013   Nonspecific abnormal electrocardiogram (ECG) (EKG) 01/06/2013   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  Lower GI bleed 12/12/2012   Right groin pain 11/29/2012   Sleep apnea with use of continuous positive airway pressure (CPAP) 11/25/2012   Ventral hernia 07/08/2012   High risk medication use 01/18/2012   Dyspnea 06/12/2011   Hyperlipidemia 12/27/2010   Hyperlipidemia associated with type 2 diabetes mellitus (HCC) 12/27/2010   Osteoarthrosis, unspecified whether generalized or localized, pelvic region and thigh 07/25/2010   Osteoarthrosis, hand 06/13/2010   Anemia 12/17/2009   Hypothyroidism 05/13/2009   GOUT 05/13/2009   Right inguinal hernia 05/13/2009   HIATAL HERNIA 05/13/2009   NEPHROLITHIASIS 05/13/2009   Rosacea 05/13/2009   HTN (hypertension) 05/13/2009   Benign essential hypertension 05/13/2009   Home Medication(s) Prior to Admission medications   Medication Sig Start Date End Date Taking? Authorizing Provider  acetaminophen  (TYLENOL ) 500 MG tablet Take 500 mg by mouth at bedtime as needed for moderate pain (pain score 4-6).    [provider]  albuterol  (VENTOLIN  HFA) 108 (90 Base) MCG/ACT inhaler Inhale 2 puffs into the lungs every 6 (six) hours as needed for wheezing or shortness of breath. 04/02/24   O'Sullivan, Melissa, NP  allopurinol   (ZYLOPRIM ) 100 MG tablet Take 1 tablet (100 mg total) by mouth 2 (two) times daily. 11/15/23   O'Sullivan, Melissa, NP  amLODipine  (NORVASC ) 2.5 MG tablet Take 1 tablet (2.5 mg total) by mouth 2 (two) times daily. 11/13/23   O'Sullivan, Melissa, NP  antiseptic oral rinse (BIOTENE) LIQD 15 mLs by Mouth Rinse route 2 (two) times daily.    [provider]  bacitracin  ointment Apply 1 Application topically 2 (two) times daily. 09/15/22   [name], [name], DO  benzonatate  (TESSALON ) 200 MG capsule Take 1 capsule (200 mg total) by mouth 3 (three) times daily as needed for cough. 04/29/24     cholestyramine  (QUESTRAN ) 4 GM/DOSE powder Take 4 g by mouth as needed (digestion). 30 ml    [provider]  diclofenac  Sodium (VOLTAREN ) 1 % GEL Apply topically. 07/21/22   [provider]  gabapentin  (NEURONTIN ) 300 MG capsule Take 1 capsule (300 mg total) by mouth at bedtime. 07/17/23   Daryl Setter, NP  glucose blood test strip TRUE Metrix Blood Glucose Test Strip, use as instructed 11/12/20   O'Sullivan, Melissa, NP  hydrALAZINE  (APRESOLINE ) 50 MG tablet Take 1 tablet (50 mg total) by mouth 3 (three) times daily. Patient not taking: Reported on 04/22/2024 10/10/23   O'Sullivan, Melissa, NP  hydrochlorothiazide  (HYDRODIURIL ) 25 MG tablet Take 1 tablet (25 mg total) by mouth daily. 03/10/24   O'Sullivan, Melissa, NP  Lancets MISC 1 each by Does not apply route 2 (two) times daily. TRUE matrix lancets 11/12/20   Daryl Setter, NP  levothyroxine  (SYNTHROID ) 100 MCG tablet Take 1 tablet (100 mcg total) by mouth in the morning. 04/29/24     levothyroxine  (SYNTHROID ) 112 MCG tablet TAKE 1 TABLET BY MOUTH ONCE DAILY BEFORE BREAKFAST TAKE  WITH  ADDITIONAL  TO  50  MCG  TABLETS 01/24/24   Daryl Setter, NP  levothyroxine  (SYNTHROID ) 50 MCG tablet TAKE 1 TABLET BY MOUTH BEFORE BREAKFAST TAKE  WITH  ADDITIONAL  TO  112  MCG  TABLETS. 01/21/24   Daryl Setter, NP  loratadine  (CLARITIN ) 10 MG  tablet Take 10 mg by mouth daily as needed for allergies or rhinitis.    [provider]  losartan  (COZAAR ) 25 MG tablet Take 25 mg by mouth daily. 04/08/24 04/08/25  [provider]  Magnesium  500 MG TABS Take 500 mg by mouth  daily.    [provider]  Menthol , Topical Analgesic, (ICY HOT EX) Apply 1 Application topically at bedtime. Lidocaine  / on back    [provider]  metroNIDAZOLE  (METROCREAM ) 0.75 % cream Apply 1 Application topically daily as needed (head).    [provider]  multivitamin-lutein  (OCUVITE-LUTEIN ) CAPS capsule Take 1 capsule by mouth daily.    [provider]  mupirocin  ointment (BACTROBAN ) 2 % Apply 1 Application topically 2 (two) times daily. 03/12/24   Sikora, Rebecca, DPM  nitroGLYCERIN  (NITROSTAT ) 0.4 MG SL tablet Place 1 tablet (0.4 mg total) under the tongue every 5 (five) minutes as needed for chest pain. 09/26/23   O'Sullivan, Melissa, NP  omeprazole  (PRILOSEC) 20 MG capsule Take 1 capsule (20 mg total) by mouth in the morning. 04/29/24     ondansetron  (ZOFRAN -ODT) 4 MG disintegrating tablet Dissolve 1 tablet (4 mg total) on the tongue every 6 (six) hours as needed for nausea or vomiting. 04/29/24     OVER THE COUNTER MEDICATION Place 1 spray into both nostrils 2 (two) times daily. Sinus saline    [provider]  OVER THE COUNTER MEDICATION Take 15 mLs by mouth at bedtime. Yellow Mustard    [provider]  polyethylene glycol powder (GLYCOLAX /MIRALAX ) 17 GM/SCOOP powder Take 17 g by mouth 2 (two) times daily as needed for mild constipation, moderate constipation or severe constipation. 12/30/22   Geiple, Joshua, PA-C  polyvinyl alcohol  (ARTIFICIAL TEARS) 1.4 % ophthalmic solution Place 1 drop into both eyes daily as needed for dry eyes.    [provider]  PREBIOTIC PRODUCT PO Take 1 tablet by mouth daily.    [provider]  rosuvastatin  (CRESTOR ) 5 MG tablet Take 1 tablet (5 mg  total) by mouth daily. 02/21/24   O'Sullivan, Melissa, NP  senna (SENOKOT) 8.6 MG tablet Take 1 tablet (8.6 mg total) by mouth daily. 04/29/24     valsartan  (DIOVAN ) 320 MG tablet Take 1 tablet (320 mg total) by mouth daily. 10/24/23   O'Sullivan, Melissa, NP  vitamin B-12 (CYANOCOBALAMIN ) 500 MCG tablet Take 500 mcg by mouth in the morning.    [provider]                                                                                                                                    Past Surgical History Past Surgical History:  Procedure Laterality Date   ABDOMINAL HERNIA REPAIR  07/15/12   Dr Kingston   ANTERIOR CERVICAL DECOMP/DISCECTOMY FUSION N/A 07/09/2020   Procedure: ANTERIOR CERVICAL DECOMPRESSION AND FUSION CERVICAL THREE-FOUR.;  Surgeon: Onetha Kuba, MD;  Location: [facility] OR;  Service: Neurosurgery;  Laterality: N/A;  anterior   BROW LIFT  05/07/01   COLONOSCOPY WITH PROPOFOL  N/A 04/24/2022   Procedure: COLONOSCOPY WITH PROPOFOL ;  Surgeon: Eda Iha, MD;  Location: [facility] ENDOSCOPY;  Service: Gastroenterology;  Laterality: N/A;   COLONOSCOPY WITH PROPOFOL  N/A 04/26/2022  Procedure: COLONOSCOPY WITH PROPOFOL ;  Surgeon: Eda Iha, MD;  Location: [facility] ENDOSCOPY;  Service: Gastroenterology;  Laterality: N/A;   CYSTOSCOPY WITH URETEROSCOPY AND STENT PLACEMENT Right 01/28/2014   Procedure: CYSTOSCOPY WITH URETEROSCOPY, BASKET RETRIVAL AND  STENT PLACEMENT;  Surgeon: Alm GORMAN Fragmin, MD;  Location: [facility] ORS;  Service: Urology;  Laterality: Right;   epidural injections     multiple procedures   ESOPHAGEAL DILATION  1993 and 1994   multiple times   EYE SURGERY Bilateral 03/25/10, 2012   cataract removal   HEMORRHOID SURGERY  2014   HIATAL HERNIA REPAIR  12/21/93   HOLMIUM LASER APPLICATION Right 01/28/2014   Procedure: HOLMIUM LASER APPLICATION;  Surgeon: Alm GORMAN Fragmin, MD;  Location: [facility] ORS;  Service: Urology;  Laterality: Right;   INGUINAL HERNIA REPAIR Right 07/15/12   Dr Kingston,  x2   JOINT REPLACEMENT  07/25/04   right knee   JOINT REPLACEMENT  07/13/10   left knee   KNEE SURGERY  1995   LAPAROSCOPIC CHOLECYSTECTOMY  09/20/06   with intraoperative cholangiogram and right inguinal herniorrhaphy with mesh    LIGAMENT REPAIR Left 07/2013   shoulder   LITHOTRIPSY     x2   LUMBAR LAMINECTOMY/DECOMPRESSION MICRODISCECTOMY Bilateral 12/27/2022   Procedure: Lumbar Two-Three, Lumbar Three-Four Sublaminar Decompression;  Surgeon: Onetha Kuba, MD;  Location: [facility] OR;  Service: Neurosurgery;  Laterality: Bilateral;   NASAL SEPTOPLASTY W/ TURBINOPLASTY Bilateral 03/31/2024   Procedure: SEPTOPLASTY, NOSE, WITH NASAL TURBINATE REDUCTION;  Surgeon: Karis Clunes, MD;  Location: [facility];  Service: ENT;  Laterality: Bilateral;   NASAL SEPTUM SURGERY  1975   POLYPECTOMY  04/26/2022   Procedure: POLYPECTOMY;  Surgeon: Eda Iha, MD;  Location: [facility] ENDOSCOPY;  Service: Gastroenterology;;   POSTERIOR CERVICAL FUSION/FORAMINOTOMY N/A 11/24/2020   Procedure: Posterior Cervical Laminectomy Cervical three-four, Cervical four-five with lateral mass fusion;  Surgeon: Onetha Kuba, MD;  Location: [facility] OR;  Service: Neurosurgery;  Laterality: N/A;   REFRACTIVE SURGERY Bilateral    Right total hip replacement  4/14   SKIN CANCER DESTRUCTION     nose, ear   TONSILLECTOMY  as child   TOTAL HIP ARTHROPLASTY Left    URETHRAL DILATION  1991   Dr. Watt   Family History Family History  Problem Relation Age of Onset   Cancer Mother    Hypertension Other    Cancer Other    Osteoarthritis Other    Stomach cancer Neg Hx    Colon cancer Neg Hx    Esophageal cancer Neg Hx     Social History Social History   Tobacco Use   Smoking status: Former    Current packs/day: 0.00    Types: Cigarettes    Start date: 09/12/1967    Quit date: 09/11/1978    Years since quitting: 45.7   Smokeless tobacco: Never  Vaping Use   Vaping status: Never Used  Substance Use Topics   Alcohol  use:  Not Currently   Drug use: No   Allergies Pravastatin , Sildenafil , Oxycodone , Atenolol, and Metoclopramide hcl  Review of Systems Review of Systems  Physical Exam Vital Signs  I have reviewed the triage vital signs BP (!) 149/79 (BP Location: Left Arm)   Pulse 70   Temp 97.6 F (36.4 C) (Oral)   Resp 16   SpO2 98%   Physical Exam Vitals and nursing note reviewed.  Constitutional:      Appearance: He is not toxic-appearing.  HENT:     Head: Normocephalic.  Cardiovascular:  Rate and Rhythm: Normal rate.  Pulmonary:     Effort: Pulmonary effort is normal.  Abdominal:     General: Abdomen is flat. There is no distension.     Palpations: Abdomen is soft.     Tenderness: There is no abdominal tenderness. There is no guarding.  Musculoskeletal:        General: Normal range of motion.     Cervical back: Normal range of motion.  Skin:    General: Skin is warm.  Neurological:     General: No focal deficit present.     Mental Status: He is alert and oriented to person, place, and time.     Cranial Nerves: No cranial nerve deficit.     Sensory: No sensory deficit.     Motor: No weakness.  Psychiatric:        Mood and Affect: Mood normal.     ED Results and Treatments Labs (all labs ordered are listed, but only abnormal results are displayed) Labs Reviewed  BASIC METABOLIC PANEL WITH GFR - Abnormal; Notable for the following components:      Result Value   Glucose, Bld 164 (*)    Creatinine, Ser 1.37 (*)    GFR, Estimated 51 (*)    All other components within normal limits  CBC WITH DIFFERENTIAL/PLATELET - Abnormal; Notable for the following components:   WBC 12.3 (*)    RBC 3.88 (*)    Hemoglobin 12.4 (*)    MCV 100.5 (*)    Neutro Abs 10.4 (*)    All other components within normal limits                                                                                                                          Radiology DG Abdomen 1 View Result Date:  05/24/2024 CLINICAL DATA:  Constipation. EXAM: ABDOMEN - 1 VIEW COMPARISON:  Abdominal radiograph dated 12/30/2022. FINDINGS: Moderate stool throughout the colon. No bowel dilatation or evidence of obstruction. No free air. Right upper quadrant cholecystectomy clips. Right renal calculus. Degenerative changes of the spine. Bilateral total hip arthroplasties. No acute osseous pathology. IMPRESSION: 1. Moderate colonic stool burden. No bowel obstruction. 2. Right renal calculus. Electronically Signed   By: Vanetta Chou M.D.   On: 05/24/2024 16:53    Pertinent labs & imaging results that were available during my care of the patient were reviewed by me and considered in my medical decision making (see MDM for details).  Medications Ordered in ED Medications  sodium chloride  0.9 % bolus 1,000 mL (1,000 mLs Intravenous New Bag/Given 05/24/24 1633)  Procedures Procedures  (including critical care time)  Medical Decision Making / ED Course   This patient presents to the ED for concern of feeling unwell at the time of discharge from rehab, this involves an extensive number of treatment options, and is a complaint that carries with it a high risk of complications and morbidity.  The differential diagnosis includes anxiety, dehydration, anemia, chronic weakness, less likely CVA.  MDM: Intensity of the patient was a bit nervous about leaving the skilled nursing facility.  He stated that he was feeling unwell, went to the bathroom, passed some gas and said that he felt better.  He has difficulty with his gait at baseline due to peripheral neuropathy, a shorter leg on the right than the left.  He uses a walker to ambulate.  He has no neurological deficits.  Son is at bedside.  Patient is interacting appropriate with the son, son states that he looks quite a bit better than he  did during that brief.  At the skilled nursing facility.  Son is arranged for extra assistance at home, but he has not yet received a call from rehab and skilled nursing.  They are supposed to call him today, which is a source of stress for him.  He is also concerned that the patient may be dehydrated or constipated as sometimes this is caused the patient issues in the past.  Will get a KUB, provide some fluids.  Basic blood work ordered.  Reassessment 5:40 PM-renal function at baseline.  Moderate amount of stool on KUB.  Will prescribe Colace.  Will discharge patient.  Son comfortable with discharge.   Additional history obtained: -Additional history obtained from son at bedside -External records from outside source obtained and reviewed including: Chart review including previous notes, labs, imaging, consultation notes   Lab Tests: -I ordered, reviewed, and interpreted labs.   The pertinent results include:   Labs Reviewed  BASIC METABOLIC PANEL WITH GFR - Abnormal; Notable for the following components:      Result Value   Glucose, Bld 164 (*)    Creatinine, Ser 1.37 (*)    GFR, Estimated 51 (*)    All other components within normal limits  CBC WITH DIFFERENTIAL/PLATELET - Abnormal; Notable for the following components:   WBC 12.3 (*)    RBC 3.88 (*)    Hemoglobin 12.4 (*)    MCV 100.5 (*)    Neutro Abs 10.4 (*)    All other components within normal limits     Imaging Studies ordered:  I independently visualized and interpreted imaging. I agree with the radiologist interpretation   Medicines ordered and prescription drug management: Meds ordered this encounter  Medications   sodium chloride  0.9 % bolus 1,000 mL    -I have reviewed the patients home medicines and have made adjustments as needed   Cardiac Monitoring: The patient was maintained on a cardiac monitor.  I personally viewed and interpreted the cardiac monitored which showed an underlying rhythm of: Normal  sinus rhythm  Social Determinants of Health:  Factors impacting patients care include: Lack of access to primary care   Reevaluation: After the interventions noted above, I reevaluated the patient and found that they have :improved  Co morbidities that complicate the patient evaluation  Past Medical History:  Diagnosis Date   Acute kidney injury (HCC) 05/02/2022   Acute metabolic encephalopathy 04/24/2022   Acute respiratory failure with hypoxia (HCC) 09/14/2023   Adenomatous polyp of ascending colon  Adenomatous polyp of colon    Allergic rhinitis 02/02/2016   Anemia 12/17/2009   Formatting of this note might be different from the original. 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 Hypertension   Benign paroxysmal positional vertigo 09/14/2021   Bilateral sacroiliitis (HCC) 07/22/2021   Body mass index (BMI) 25.0-25.9, adult 11/02/2020   Cancer (HCC)    skin cancer   Cardiac murmur 07/25/2021   Carotid stenosis    a. Carotid U/S 5/13: LICA < 50%, RICA 50-69%;  b.  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   Cystoid macular edema of both eyes 07/28/2020   Degenerative disc disease, lumbar 06/30/2015   Deviated nasal septum 02/18/2015    Diverticulosis 03/03/2020   DJD (degenerative joint disease) of hip 12/24/2012   Drowsiness 12/07/2022   Dry eyes 09/14/2023   Dyspnea 06/12/2011   Emphysema, unspecified (HCC) 03/05/2020   Erectile dysfunction 06/20/2017   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   Fall 09/18/2022   Gait disorder 03/02/2021   Gait instability 03/02/2021   GERD (gastroesophageal reflux disease)    GI bleed 04/21/2022   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   Qualifier: Diagnosis of  By: Gwenn Runell     Greater trochanteric pain syndrome 07/22/2021   Hand pain, left 01/16/2022   Hand weakness 05/02/2022   Hearing loss 02/02/2016   Hematochezia    Heme positive stool 02/29/2020   Hemorrhagic shock (HCC)    HIATAL HERNIA 05/13/2009   Qualifier: Diagnosis of   By: Gwenn Runell      Replacing diagnoses that were inactivated after the 12/11/22 regulatory import     High risk medication use 01/18/2012   Hip pain 03/02/2021   History of chicken pox    History of COVID-19 12/05/2021   History of hemorrhoids    History of kidney stones    History of skin cancer 07/22/2021   skin cancer   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  Cardiology - Dr Orlean at [facility] cardiology     ICD-10 cut over     Hyperlipidemia with target LDL less than 130 12/27/2010   Formatting of this note might be different from the original. Cardiology - Dr Orlean at [facility] cardiology  ICD-10 cut over   Hypokalemia 07/08/2021   Hypothyroidism    Internal hemorrhoids 03/03/2020   Iron  deficiency anemia 09/18/2022   Irregular heart beat 02/03/2022   Leg cramps 11/13/2019   Lower GI bleed 12/12/2012   Lumbago of lumbar region with sciatica 10/21/2020  Lumbar radiculopathy 03/02/2021   Lumbar  spondylosis 11/02/2020   NEPHROLITHIASIS 05/13/2009   Qualifier: Diagnosis of  By: Gwenn Grimes     Nonspecific abnormal electrocardiogram (ECG) (EKG) 01/06/2013   Numbness of hand 01/24/2022   Onychomycosis 06/30/2015   OSA (obstructive sleep apnea) 11/25/2012   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 Osteoarthritis Of The Hand  10/1 IMO update   Osteoarthrosis, unspecified whether generalized or localized, pelvic region and thigh 07/25/2010   Formatting of this note might be different from the original. 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  User time 6 hours.  479 days , not used only 12 days - highly compliant 06-23-13  .    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   Urinary retention 11/26/2020   Vasovagal syncope    Ventral hernia 07/08/2012   Vertigo 07/08/2021   Vitamin D  deficiency 08/07/2022   Weakness of lower extremity 10/10/2022   Weight loss 03/03/2020      Dispostion: I considered admission for this patient, however patient is at his baseline, he is appropriate for discharge.     Final Clinical Impression(s) / ED Diagnoses Final diagnoses:  General medical exam     @PCDICTATION @    Mannie Pac T, DO 05/24/24 1744

## 2024-05-24 NOTE — Discharge Instructions (Addendum)
 You were in the emergency room, you had blood work done that was normal.  You do not appear to be dehydrated.  You have a moderate amount of stool in your colon, but did not appear severely constipated.  I sent your prescription for a stool softener called Colace.  You may take that each day.  Follow-up with your primary care doctor.  Return to the emergency department as needed.

## 2024-05-25 DIAGNOSIS — E1143 Type 2 diabetes mellitus with diabetic autonomic (poly)neuropathy: Secondary | ICD-10-CM | POA: Diagnosis not present

## 2024-05-25 DIAGNOSIS — E785 Hyperlipidemia, unspecified: Secondary | ICD-10-CM | POA: Diagnosis not present

## 2024-05-25 DIAGNOSIS — E1122 Type 2 diabetes mellitus with diabetic chronic kidney disease: Secondary | ICD-10-CM | POA: Diagnosis not present

## 2024-05-25 DIAGNOSIS — D631 Anemia in chronic kidney disease: Secondary | ICD-10-CM | POA: Diagnosis not present

## 2024-05-25 DIAGNOSIS — I700 Atherosclerosis of aorta: Secondary | ICD-10-CM | POA: Diagnosis not present

## 2024-05-25 DIAGNOSIS — E538 Deficiency of other specified B group vitamins: Secondary | ICD-10-CM | POA: Diagnosis not present

## 2024-05-25 DIAGNOSIS — N183 Chronic kidney disease, stage 3 unspecified: Secondary | ICD-10-CM | POA: Diagnosis not present

## 2024-05-25 DIAGNOSIS — M1030 Gout due to renal impairment, unspecified site: Secondary | ICD-10-CM | POA: Diagnosis not present

## 2024-05-26 ENCOUNTER — Other Ambulatory Visit (HOSPITAL_BASED_OUTPATIENT_CLINIC_OR_DEPARTMENT_OTHER): Payer: Self-pay

## 2024-05-26 ENCOUNTER — Telehealth: Payer: Self-pay | Admitting: *Deleted

## 2024-05-26 DIAGNOSIS — D631 Anemia in chronic kidney disease: Secondary | ICD-10-CM | POA: Diagnosis not present

## 2024-05-26 DIAGNOSIS — M1030 Gout due to renal impairment, unspecified site: Secondary | ICD-10-CM | POA: Diagnosis not present

## 2024-05-26 DIAGNOSIS — E785 Hyperlipidemia, unspecified: Secondary | ICD-10-CM | POA: Diagnosis not present

## 2024-05-26 DIAGNOSIS — E1143 Type 2 diabetes mellitus with diabetic autonomic (poly)neuropathy: Secondary | ICD-10-CM | POA: Diagnosis not present

## 2024-05-26 DIAGNOSIS — E538 Deficiency of other specified B group vitamins: Secondary | ICD-10-CM | POA: Diagnosis not present

## 2024-05-26 DIAGNOSIS — E1122 Type 2 diabetes mellitus with diabetic chronic kidney disease: Secondary | ICD-10-CM | POA: Diagnosis not present

## 2024-05-26 DIAGNOSIS — I700 Atherosclerosis of aorta: Secondary | ICD-10-CM | POA: Diagnosis not present

## 2024-05-26 DIAGNOSIS — I129 Hypertensive chronic kidney disease with stage 1 through stage 4 chronic kidney disease, or unspecified chronic kidney disease: Secondary | ICD-10-CM | POA: Diagnosis not present

## 2024-05-26 DIAGNOSIS — N183 Chronic kidney disease, stage 3 unspecified: Secondary | ICD-10-CM | POA: Diagnosis not present

## 2024-05-26 NOTE — Telephone Encounter (Signed)
 Copied from CRM [id number]. Topic: Clinical - Medication Question >> May 26, 2024  8:39 AM Carlatta H wrote: Reason for CRM: Patients son called due to being prescribed new medications from being in the hospital//He would like NP Melissa Osullivan to approve all new medications because refill will be needed//Please call patients son at [phone] [place]) leave a detailed message if needed//

## 2024-05-26 NOTE — Telephone Encounter (Signed)
 Spoke to patient's son and advised the patient will need a visit so provider can address his current needs. Patient's son reports he is not able to bring his father at this time. He will email current lis of medications.

## 2024-06-02 ENCOUNTER — Telehealth: Payer: Self-pay | Admitting: Family

## 2024-06-02 DIAGNOSIS — M1A0720 Idiopathic chronic gout, left ankle and foot, without tophus (tophi): Secondary | ICD-10-CM

## 2024-06-02 DIAGNOSIS — I10 Essential (primary) hypertension: Secondary | ICD-10-CM

## 2024-06-02 DIAGNOSIS — E039 Hypothyroidism, unspecified: Secondary | ICD-10-CM

## 2024-06-02 DIAGNOSIS — K219 Gastro-esophageal reflux disease without esophagitis: Secondary | ICD-10-CM

## 2024-06-02 MED ORDER — LOSARTAN POTASSIUM 25 MG PO TABS: 25.0000 mg | ORAL_TABLET | Freq: Every day | ORAL | 0 refills | Status: DC

## 2024-06-02 MED ORDER — METHOCARBAMOL 500 MG PO TABS: 500.0000 mg | ORAL_TABLET | Freq: Three times a day (TID) | ORAL | 0 refills | Status: DC | PRN

## 2024-06-02 MED ORDER — AMLODIPINE BESYLATE 10 MG PO TABS: 10.0000 mg | ORAL_TABLET | Freq: Every day | ORAL | 0 refills | Status: DC

## 2024-06-02 MED ORDER — ALBUTEROL SULFATE HFA 108 (90 BASE) MCG/ACT IN AERS: 2.0000 | INHALATION_SPRAY | Freq: Four times a day (QID) | RESPIRATORY_TRACT | 1 refills | Status: AC | PRN

## 2024-06-02 MED ORDER — OMEPRAZOLE 20 MG PO CPDR
20.0000 mg | DELAYED_RELEASE_CAPSULE | Freq: Every morning | ORAL | 0 refills | Status: DC
Start: 2024-06-02 — End: 2024-08-05

## 2024-06-02 MED ORDER — NITROGLYCERIN 0.4 MG SL SUBL: 0.4000 mg | SUBLINGUAL_TABLET | SUBLINGUAL | 6 refills | Status: AC | PRN

## 2024-06-02 MED ORDER — ALLOPURINOL 100 MG PO TABS: 100.0000 mg | ORAL_TABLET | Freq: Two times a day (BID) | ORAL | 1 refills | Status: DC

## 2024-06-02 MED ORDER — ONDANSETRON HCL 4 MG PO TABS: 4.0000 mg | ORAL_TABLET | Freq: Three times a day (TID) | ORAL | 0 refills | Status: AC | PRN

## 2024-06-02 MED ORDER — LEVOTHYROXINE SODIUM 100 MCG PO TABS: 100.0000 ug | ORAL_TABLET | Freq: Every day | ORAL | 3 refills | Status: DC

## 2024-06-02 MED ORDER — ATORVASTATIN CALCIUM 10 MG PO TABS: 10.0000 mg | ORAL_TABLET | Freq: Every day | ORAL | 3 refills | Status: DC

## 2024-06-02 MED ORDER — BENZONATATE 200 MG PO CAPS: 200.0000 mg | ORAL_CAPSULE | Freq: Three times a day (TID) | ORAL | 0 refills | Status: AC | PRN

## 2024-06-02 NOTE — Telephone Encounter (Signed)
 Reviewed medication list from SNF, reconciled and sent refills per family request.

## 2024-06-03 ENCOUNTER — Ambulatory Visit: Payer: Self-pay

## 2024-06-03 NOTE — Telephone Encounter (Signed)
 Message from [place] sent at 06/03/2024  1:23 PM EDT  Summary: UTI Test   Caller/Agency: [facility]  Callback Number: [phone]  Service Requested: Urine Sample Order. Patient is having burning during urination and incontinence at night. Nurse does come out this Thursday and they want to get the order in before then.

## 2024-06-04 ENCOUNTER — Ambulatory Visit (INDEPENDENT_AMBULATORY_CARE_PROVIDER_SITE_OTHER): Admitting: Family

## 2024-06-04 ENCOUNTER — Telehealth: Payer: Self-pay | Admitting: Family

## 2024-06-04 VITALS — BP 118/60 | HR 66 | Temp 97.7°F | Resp 16 | Ht 66.0 in | Wt 134.8 lb

## 2024-06-04 DIAGNOSIS — G4730 Sleep apnea, unspecified: Secondary | ICD-10-CM | POA: Diagnosis not present

## 2024-06-04 DIAGNOSIS — E039 Hypothyroidism, unspecified: Secondary | ICD-10-CM | POA: Diagnosis not present

## 2024-06-04 DIAGNOSIS — N1832 Chronic kidney disease, stage 3b: Secondary | ICD-10-CM

## 2024-06-04 DIAGNOSIS — Z23 Encounter for immunization: Secondary | ICD-10-CM | POA: Diagnosis not present

## 2024-06-04 DIAGNOSIS — E785 Hyperlipidemia, unspecified: Secondary | ICD-10-CM | POA: Diagnosis not present

## 2024-06-04 DIAGNOSIS — I6529 Occlusion and stenosis of unspecified carotid artery: Secondary | ICD-10-CM

## 2024-06-04 DIAGNOSIS — Z79899 Other long term (current) drug therapy: Secondary | ICD-10-CM

## 2024-06-04 DIAGNOSIS — G4733 Obstructive sleep apnea (adult) (pediatric): Secondary | ICD-10-CM

## 2024-06-04 NOTE — Progress Notes (Unsigned)
 Subjective:     Patient ID: [name] Pratt, male    DOB: 08/24/1941, 83 y.o.   MRN: [id number]  Chief Complaint  Patient presents with   Follow-up    Here for follow up after rehab     HPI  Discussed the use of AI scribe software for clinical note transcription with the patient, who gave verbal consent to proceed.  History of Present Illness  [name] Pratt is an 83 year old male who presents for medication management and follow-up after recent hospitalizations and rehabilitation stays. He is accompanied by his son, [name].  He was initially seen in the emergency room on August 13th for dehydration and weakness, receiving fluids and a downward adjustment of thyroid  medication. He was transferred to a skilled rehab facility for further care. After returning home, he experienced constipation and inability to eat, requiring an enema for relief. He was then transferred to another rehab facility due to bed availability issues.  Overall, since returning home from the SNF he has been doing well. Eating and drinking well. They have been contacted by home health OT/PT.  OT has started but PT has not yet started.   Medication discrepancies exist between SNF discharge and home availability. His son sent us  the med list from his SNF which has been reconciled with our record and refills sent to his pharmacy.  He receives home health services, including occupational therapy, but has not yet seen a physical therapist. A leg length discrepancy from a previous ruptured tendon affects his mobility. He declines a lift shoe evaluation.      Health Maintenance Due  Topic Date Due   Medicare Annual Wellness (AWV)  01/20/2023   COVID-19 Vaccine (6 - Pfizer risk 2024-25 season) 05/12/2024    Past Medical History:  Diagnosis Date   Acute kidney injury 05/02/2022   Acute metabolic encephalopathy 04/24/2022   Acute respiratory failure with hypoxia (HCC) 09/14/2023   Adenomatous polyp of  ascending colon    Adenomatous polyp of colon    Allergic rhinitis 02/02/2016   Anemia 12/17/2009   Formatting of this note might be different from the original. 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 Hypertension   Benign paroxysmal positional vertigo 09/14/2021   Bilateral sacroiliitis 07/22/2021   Body mass index (BMI) 25.0-25.9, adult 11/02/2020   Cancer (HCC)    skin cancer   Cardiac murmur 07/25/2021   Carotid stenosis    a. Carotid U/S 5/13: LICA < 50%, RICA 50-69%;  b.  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   Cystoid macular edema of both eyes 07/28/2020   Degenerative disc disease, lumbar 06/30/2015   Deviated nasal septum 02/18/2015   Diverticulosis 03/03/2020   DJD (degenerative joint disease)  of hip 12/24/2012   Drowsiness 12/07/2022   Dry eyes 09/14/2023   Dyspnea 06/12/2011   Emphysema, unspecified (HCC) 03/05/2020   Erectile dysfunction 06/20/2017   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   Fall 09/18/2022   Gait disorder 03/02/2021   Gait instability 03/02/2021   GERD (gastroesophageal reflux disease)    GI bleed 04/21/2022   GOUT 05/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    Qualifier: Diagnosis of  By: Gwenn Grimes     Greater trochanteric pain syndrome 07/22/2021   Hand pain, left 01/16/2022   Hand weakness 05/02/2022   Hearing loss 02/02/2016   Hematochezia    Heme positive stool 02/29/2020   Hemorrhagic shock (HCC)    HIATAL HERNIA 05/13/2009   Qualifier: Diagnosis of   By: Gwenn Grimes      Replacing diagnoses that were inactivated after the 12/11/22 regulatory import     High risk medication use 01/18/2012   Hip pain 03/02/2021   History of chicken pox    History of COVID-19 12/05/2021   History of hemorrhoids    History of kidney stones    History of skin cancer 07/22/2021   skin cancer   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  Cardiology - Dr Orlean at [facility] cardiology     ICD-10 cut over     Hyperlipidemia with target LDL less than 130 12/27/2010   Formatting of this note might be different from the original. Cardiology - Dr Orlean at [facility] cardiology  ICD-10 cut over   Hypokalemia 07/08/2021   Hypothyroidism    Internal hemorrhoids 03/03/2020   Iron  deficiency anemia 09/18/2022   Irregular heart beat 02/03/2022   Leg cramps 11/13/2019   Lower GI bleed 12/12/2012   Lumbago of lumbar region with sciatica 10/21/2020   Lumbar radiculopathy 03/02/2021   Lumbar spondylosis 11/02/2020   NEPHROLITHIASIS 05/13/2009   Qualifier: Diagnosis of  By: Gwenn Grimes     Nonspecific abnormal electrocardiogram (ECG) (EKG) 01/06/2013   Numbness of hand 01/24/2022   Onychomycosis 06/30/2015   OSA (obstructive sleep apnea) 11/25/2012   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 Osteoarthritis Of The Hand  10/1 IMO update   Osteoarthrosis,  unspecified whether generalized or localized, pelvic region and thigh 07/25/2010   Formatting of this note might be different from the original. 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  User time 6 hours.  479 days , not used only 12 days - highly compliant 06-23-13  .    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   Urinary retention 11/26/2020   Vasovagal syncope    Ventral hernia 07/08/2012   Vertigo 07/08/2021   Vitamin D  deficiency 08/07/2022   Weakness of lower extremity 10/10/2022   Weight loss 03/03/2020    Past Surgical History:  Procedure Laterality Date   ABDOMINAL HERNIA REPAIR  07/15/12   Dr Kingston   ANTERIOR CERVICAL DECOMP/DISCECTOMY FUSION N/A 07/09/2020   Procedure: ANTERIOR CERVICAL DECOMPRESSION  AND FUSION CERVICAL THREE-FOUR.;  Surgeon: Onetha Arley, MD;  Location: [facility] OR;  Service: Neurosurgery;  Laterality: N/A;  anterior   BROW LIFT  05/07/01   COLONOSCOPY WITH PROPOFOL  N/A 04/24/2022   Procedure: COLONOSCOPY WITH PROPOFOL ;  Surgeon: Eda Iha, MD;  Location: [facility] ENDOSCOPY;  Service: Gastroenterology;  Laterality: N/A;   COLONOSCOPY WITH PROPOFOL  N/A 04/26/2022   Procedure: COLONOSCOPY WITH PROPOFOL ;  Surgeon: Eda Iha, MD;  Location: [facility] ENDOSCOPY;  Service: Gastroenterology;  Laterality: N/A;   CYSTOSCOPY WITH URETEROSCOPY AND STENT PLACEMENT Right 01/28/2014   Procedure: CYSTOSCOPY WITH URETEROSCOPY, BASKET RETRIVAL AND  STENT PLACEMENT;  Surgeon: Alm GORMAN Fragmin, MD;  Location: [facility] ORS;  Service: Urology;  Laterality: Right;   epidural injections     multiple procedures   ESOPHAGEAL DILATION  1993 and 1994   multiple times   EYE SURGERY Bilateral 03/25/10, 2012   cataract removal   HEMORRHOID SURGERY  2014   HIATAL HERNIA REPAIR  12/21/93   HOLMIUM LASER APPLICATION Right 01/28/2014   Procedure: HOLMIUM LASER APPLICATION;  Surgeon: Alm GORMAN Fragmin, MD;  Location: [facility] ORS;  Service: Urology;  Laterality: Right;   INGUINAL HERNIA REPAIR Right 07/15/12   Dr Kingston, x2   JOINT REPLACEMENT  07/25/04   right knee   JOINT REPLACEMENT  07/13/10   left knee   KNEE SURGERY  1995   LAPAROSCOPIC CHOLECYSTECTOMY  09/20/06   with intraoperative cholangiogram and right inguinal herniorrhaphy with mesh    LIGAMENT REPAIR Left 07/2013   shoulder   LITHOTRIPSY     x2   LUMBAR LAMINECTOMY/DECOMPRESSION MICRODISCECTOMY Bilateral 12/27/2022   Procedure: Lumbar Two-Three, Lumbar Three-Four Sublaminar Decompression;  Surgeon: Onetha Arley, MD;  Location: [facility] OR;  Service: Neurosurgery;  Laterality: Bilateral;   NASAL SEPTOPLASTY W/ TURBINOPLASTY Bilateral 03/31/2024   Procedure: SEPTOPLASTY, NOSE, WITH NASAL TURBINATE REDUCTION;  Surgeon: Karis Clunes, MD;  Location: [facility];   Service: ENT;  Laterality: Bilateral;   NASAL SEPTUM SURGERY  1975   POLYPECTOMY  04/26/2022   Procedure: POLYPECTOMY;  Surgeon: Eda Iha, MD;  Location: [facility] ENDOSCOPY;  Service: Gastroenterology;;   POSTERIOR CERVICAL FUSION/FORAMINOTOMY N/A 11/24/2020   Procedure: Posterior Cervical Laminectomy Cervical three-four, Cervical four-five with lateral mass fusion;  Surgeon: Onetha Arley, MD;  Location: [facility] OR;  Service: Neurosurgery;  Laterality: N/A;   REFRACTIVE SURGERY Bilateral    Right total hip replacement  4/14   SKIN CANCER DESTRUCTION     nose, ear  TONSILLECTOMY  as child   TOTAL HIP ARTHROPLASTY Left    URETHRAL DILATION  1991   Dr. 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   2 grandchildren   Has worked for Warden/r[employer]   Enjoys wood working   Completed HS.  [employer]   Social Drivers of Corporate investment banker Strain: Low Risk  (10/28/2021)   Overall Financial Resource Strain (CARDIA)    Difficulty of Paying Living Expenses: Not  very hard  Food Insecurity: Low Risk  (04/23/2024)   Received from [facility]   Hunger Vital Sign    Within the past 12 months, you worried that your food would run out before you got money to buy more: Never true    Within the past 12 months, the food you bought just didn't last and you didn't have money to get more. : Never true  Transportation Needs: No Transportation Needs (04/23/2024)   Received from [facility]    In the past 12 months, has lack of reliable transportation kept you from medical appointments, meetings, work or from getting things needed for daily living? : No  Physical Activity: Inactive (03/03/2024)   Exercise Vital Sign    Days of Exercise per Week: 0 days    Minutes of Exercise per Session: 0 min  Stress: Stress Concern Present (03/03/2024)   [employer] of Occupational Health - Occupational Stress Questionnaire    Feeling of Stress: To some extent  Social Connections: Moderately Isolated (09/14/2023)   Social Connection and Isolation Panel    Frequency of Communication with Friends and Family: Three times a week    Frequency of Social Gatherings with Friends and Family: Three times a week    Attends Religious Services: Never    Active Member of Clubs or Organizations: No    Attends Banker Meetings: Never    Marital Status: Married  Catering manager Violence: Not At Risk (03/03/2024)   Humiliation, Afraid, Rape, and Kick questionnaire    Fear of Current or Ex-Partner: No    Emotionally Abused: No    Physically Abused: No    Sexually Abused: No    Outpatient Medications Prior to Visit  Medication Sig Dispense Refill   acetaminophen  (TYLENOL ) 500 MG tablet  Take 500 mg by mouth at bedtime as needed for moderate pain (pain score 4-6).     albuterol  (VENTOLIN  HFA) 108 (90 Base) MCG/ACT inhaler Inhale 2 puffs into the lungs every 6 (six) hours as needed for wheezing or shortness of breath. 6.7 g 1   allopurinol  (ZYLOPRIM ) 100  MG tablet Take 1 tablet (100 mg total) by mouth 2 (two) times daily. 180 tablet 1   amLODipine  (NORVASC ) 10 MG tablet Take 1 tablet (10 mg total) by mouth daily. 90 tablet 0   antiseptic oral rinse (BIOTENE) LIQD 15 mLs by Mouth Rinse route 2 (two) times daily.     atorvastatin  (LIPITOR) 10 MG tablet Take 1 tablet (10 mg total) by mouth daily. 90 tablet 3   bacitracin  ointment Apply 1 Application topically 2 (two) times daily. 120 g 0   benzonatate  (TESSALON ) 200 MG capsule Take 1 capsule (200 mg total) by mouth 3 (three) times daily as needed for cough. 20 capsule 0   cholestyramine  (QUESTRAN ) 4 GM/DOSE powder Take 4 g by mouth as needed (digestion). 30 ml     diclofenac  Sodium (VOLTAREN ) 1 % GEL Apply topically.     docusate sodium  (COLACE) 100 MG capsule Take 1 capsule (100 mg total) by mouth every 12 (twelve) hours. 100 capsule 0   gabapentin  (NEURONTIN ) 300 MG capsule Take 1 capsule (300 mg total) by mouth at bedtime. 90 capsule 1   glucose blood test strip TRUE Metrix Blood Glucose Test Strip, use as instructed 100 each 12   hydrochlorothiazide  (HYDRODIURIL ) 25 MG tablet Take 1 tablet (25 mg total) by mouth daily. 90 tablet 0   Lancets MISC 1 each by Does not apply route 2 (two) times daily. TRUE matrix lancets 100 each 3   levothyroxine  (SYNTHROID ) 100 MCG tablet Take 1 tablet (100 mcg total) by mouth daily. 90 tablet 3   loratadine  (CLARITIN ) 10 MG tablet Take 10 mg by mouth daily as needed for allergies or rhinitis.     losartan  (COZAAR ) 25 MG tablet Take 1 tablet (25 mg total) by mouth daily. 90 tablet 0   Magnesium  500 MG TABS Take 500 mg by mouth daily.     Menthol , Topical Analgesic, (ICY HOT EX) Apply 1 Application topically at bedtime. Lidocaine  / on back     methocarbamol  (ROBAXIN ) 500 MG tablet Take 1 tablet (500 mg total) by mouth every 8 (eight) hours as needed for muscle spasms. 30 tablet 0   metroNIDAZOLE  (METROCREAM ) 0.75 % cream Apply 1 Application topically daily as needed  (head).     multivitamin-lutein  (OCUVITE-LUTEIN ) CAPS capsule Take 1 capsule by mouth daily.     mupirocin  ointment (BACTROBAN ) 2 % Apply 1 Application topically 2 (two) times daily. 22 g 2   nitroGLYCERIN  (NITROSTAT ) 0.4 MG SL tablet Place 1 tablet (0.4 mg total) under the tongue every 5 (five) minutes as needed for chest pain. 25 tablet 6   omeprazole  (PRILOSEC) 20 MG capsule Take 1 capsule (20 mg total) by mouth in the morning. 90 capsule 0   ondansetron  (ZOFRAN ) 4 MG tablet Take 1 tablet (4 mg total) by mouth every 8 (eight) hours as needed for nausea or vomiting. 30 tablet 0   OVER THE COUNTER MEDICATION Place 1 spray into both nostrils 2 (two) times daily. Sinus saline     OVER THE COUNTER MEDICATION Take 15 mLs by mouth at bedtime. Yellow Mustard     polyethylene glycol powder (GLYCOLAX /MIRALAX ) 17 GM/SCOOP powder Take 17 g by mouth  2 (two) times daily as needed for mild constipation, moderate constipation or severe constipation. 255 g 0   polyvinyl alcohol  (ARTIFICIAL TEARS) 1.4 % ophthalmic solution Place 1 drop into both eyes daily as needed for dry eyes.     PREBIOTIC PRODUCT PO Take 1 tablet by mouth daily.     senna (SENOKOT) 8.6 MG tablet Take 1 tablet (8.6 mg total) by mouth daily. 100 tablet 0   vitamin B-12 (CYANOCOBALAMIN ) 500 MCG tablet Take 500 mcg by mouth in the morning.     hydrALAZINE  (APRESOLINE ) 50 MG tablet Take 1 tablet (50 mg total) by mouth 3 (three) times daily. (Patient not taking: Reported on 04/22/2024) 270 tablet 0   No facility-administered medications prior to visit.    Allergies  Allergen Reactions   Pravastatin  Other (See Comments)    Muscle cramps   Sildenafil  Other (See Comments)    Tachycardia     Oxycodone  Other (See Comments)    Hallucinations    Atenolol Other (See Comments)    Bradycardia    Metoclopramide Hcl Anxiety    ROS    See HPI Objective:    Physical Exam Constitutional:      General: He is not in acute distress.     Appearance: He is well-developed.  HENT:     Head: Normocephalic and atraumatic.  Cardiovascular:     Rate and Rhythm: Normal rate and regular rhythm.     Heart sounds: No murmur heard. Pulmonary:     Effort: Pulmonary effort is normal. No respiratory distress.     Breath sounds: Normal breath sounds. No wheezing or rales.  Skin:    General: Skin is warm and dry.  Neurological:     Mental Status: He is alert and oriented to person, place, and time.  Psychiatric:        Behavior: Behavior normal.        Thought Content: Thought content normal.      BP 118/60 (BP Location: Right Arm, Patient Position: Sitting, Cuff Size: Small)   Pulse 66   Temp 97.7 F (36.5 C) (Oral)   Resp 16   Ht 5' 6 (1.676 m)   Wt 134 lb 12.8 oz (61.1 kg)   SpO2 99%   BMI 21.76 kg/m  Wt Readings from Last 3 Encounters:  06/04/24 134 lb 12.8 oz (61.1 kg)  03/31/24 151 lb 3.8 oz (68.6 kg)  03/18/24 135 lb (61.2 kg)       Assessment & Plan:   Problem List Items Addressed This Visit       Unprioritized   Stage 3b chronic kidney disease (HCC)   Update renal function.       Sleep apnea with use of continuous positive airway pressure (CPAP)   Patient is not using cpap since he had ENT surgery on his septum. I will reach out to his pulmonologist to see how his follow up home sleep study was and if he needs to continue cpap.       Polypharmacy    Medication regimen requires optimization following recent hospitalizations and changes in prescriptions. - Printed and provided an up-to-date medication list to patient/son.  - Send refills for active medications to the pharmacy. - Involve a pharmacist to assist with medication organization and packaging.      Relevant Orders   AMB Referral [facility]BCI Care Management   Hypothyroidism - Primary   Relevant Orders   TSH (Completed)   Hyperlipidemia   Relevant Orders   Comp  Met (CMET) (Completed)   Lipid panel (Completed)   Carotid stenosis   Relevant  Orders   US  Carotid Duplex Bilateral   Other Visit Diagnoses       Needs flu shot       Relevant Orders   Flu vaccine HIGH DOSE PF(Fluzone Trivalent) (Completed)     OSA (obstructive sleep apnea)         I personally spent a total of 56 minutes in the care of the patient today including preparing to see the patient, getting/reviewing separately obtained history, performing a medically appropriate exam/evaluation, counseling and educating, placing orders, documenting clinical information in the EHR, and coordinating care.   I have discontinued Harmon B. Bellis [name]'s hydrALAZINE . I am also having him maintain his glucose blood, Lancets, Magnesium , vitamin B-12, loratadine , antiseptic oral rinse, artificial tears, bacitracin , PREBIOTIC PRODUCT PO, acetaminophen , (Menthol , Topical Analgesic, (ICY HOT EX)), metroNIDAZOLE , OVER THE COUNTER MEDICATION, cholestyramine , OVER THE COUNTER MEDICATION, multivitamin-lutein , polyethylene glycol powder, gabapentin , diclofenac  Sodium, hydrochlorothiazide , mupirocin  ointment, senna, docusate sodium , allopurinol , amLODipine , levothyroxine , losartan , methocarbamol , nitroGLYCERIN , benzonatate , omeprazole , ondansetron , albuterol , and atorvastatin .  No orders of the defined types were placed in this encounter.

## 2024-06-04 NOTE — Assessment & Plan Note (Signed)
 Patient is not using cpap since he had ENT surgery on his septum. I will reach out to his pulmonologist to see how his follow up home sleep study was and if he needs to continue cpap.

## 2024-06-04 NOTE — Telephone Encounter (Signed)
-----   Message from [facility] sent at 06/04/2024  3:20 PM EDT ----- His sleep study 5/19 showed severe sleep apnea, 48/hr. He didn't follow-up with us  as instructed. I will get him in to see where things stand. Thanks. [name] Pratt ----- Message ----- From: Daryl Setter, NP Sent: 06/04/2024   2:35 PM EDT To: [name], MD  Hi Dr. Salt,  I just wanted to touch base with you regarding pt's sleep apnea.  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  Are you managing this? Also, he said he had a home sleep study recently but I can't find that result.  Thanks!  Raysha Tilmon O'Sullivan FNP-C

## 2024-06-04 NOTE — Patient Instructions (Addendum)
 The following refills have been sent to your pharmacy:   Atorvastatin  Calcium  10 mg Oral Daily  Methocarbamol  500 mg Oral Every 8 hours PRN  Ondansetron  HCl 4 mg Oral Every 8 hours PRN  amLODIPine  Besylate 10 mg Oral Daily   Allopurinol  100 mg Oral 2 times daily  Losartan  Potassium 25 mg Oral Daily  Nitroglycerin  0.4 mg Sublingual Every 5 min PRN  Benzonatate  200 MG Take 1 capsule (200 mg total) by mouth 3 (three) times daily as needed for cough.  Omeprazole  20 mg Oral Every morning  Albuterol  Sulfate 2 puffs Inhalation Every 6 hours PRN   VISIT SUMMARY:  Today, we reviewed your medications and discussed your recent hospitalizations and rehabilitation stays. We also addressed your generalized weakness, difficulty walking, and thyroid  medication adjustments.  YOUR PLAN:  MEDICATION MANAGEMENT AND OPTIMIZATION: Your medication regimen needs to be optimized following recent hospitalizations and changes in prescriptions. -We will print and provide you with an up-to-date medication list. -Refills for your active medications will be sent to the pharmacy. -We will coordinate with [facility] for medication packaging. -A pharmacist will assist with medication organization and packaging.  GENERALIZED WEAKNESS AND DIFFICULTY AMBULATING WITH FALL RISK: You have persistent generalized weakness and difficulty walking, with a history of falls. Physical therapy is planned but delayed. -We will confirm with [facility] that physical therapy is scheduled. -If physical therapy is not already scheduled, we will place a referral for you.  HYPOTHYROIDISM: Your thyroid  medication was recently adjusted due to previous overmedication. -We will monitor your thyroid  function with follow-up labs.  RIGHT LEG LENGTH DISCREPANCY: You have a right leg length discrepancy of approximately two inches, previously addressed with a shoe lift due to a ruptured tendon. -If you decide to pursue an elevated shoe, we  can offer a referral to podiatry.

## 2024-06-04 NOTE — Assessment & Plan Note (Signed)
 Update renal function.

## 2024-06-04 NOTE — Assessment & Plan Note (Signed)
 BP stable, continue

## 2024-06-04 NOTE — Assessment & Plan Note (Signed)
  Medication regimen requires optimization following recent hospitalizations and changes in prescriptions. - Printed and provided an up-to-date medication list to patient/son.  - Send refills for active medications to the pharmacy. - Involve a pharmacist to assist with medication organization and packaging.

## 2024-06-05 ENCOUNTER — Telehealth: Payer: Self-pay

## 2024-06-05 ENCOUNTER — Other Ambulatory Visit (HOSPITAL_BASED_OUTPATIENT_CLINIC_OR_DEPARTMENT_OTHER): Payer: Self-pay

## 2024-06-05 DIAGNOSIS — D631 Anemia in chronic kidney disease: Secondary | ICD-10-CM | POA: Diagnosis not present

## 2024-06-05 DIAGNOSIS — N183 Chronic kidney disease, stage 3 unspecified: Secondary | ICD-10-CM | POA: Diagnosis not present

## 2024-06-05 DIAGNOSIS — E1122 Type 2 diabetes mellitus with diabetic chronic kidney disease: Secondary | ICD-10-CM | POA: Diagnosis not present

## 2024-06-05 DIAGNOSIS — I129 Hypertensive chronic kidney disease with stage 1 through stage 4 chronic kidney disease, or unspecified chronic kidney disease: Secondary | ICD-10-CM | POA: Diagnosis not present

## 2024-06-05 DIAGNOSIS — E538 Deficiency of other specified B group vitamins: Secondary | ICD-10-CM | POA: Diagnosis not present

## 2024-06-05 DIAGNOSIS — E1143 Type 2 diabetes mellitus with diabetic autonomic (poly)neuropathy: Secondary | ICD-10-CM | POA: Diagnosis not present

## 2024-06-05 DIAGNOSIS — M1030 Gout due to renal impairment, unspecified site: Secondary | ICD-10-CM | POA: Diagnosis not present

## 2024-06-05 DIAGNOSIS — E785 Hyperlipidemia, unspecified: Secondary | ICD-10-CM | POA: Diagnosis not present

## 2024-06-05 DIAGNOSIS — I700 Atherosclerosis of aorta: Secondary | ICD-10-CM | POA: Diagnosis not present

## 2024-06-05 LAB — COMPREHENSIVE METABOLIC PANEL WITH GFR
ALT: 16 U/L (ref 0–53)
AST: 21 U/L (ref 0–37)
Albumin: 4.3 g/dL (ref 3.5–5.2)
Alkaline Phosphatase: 101 U/L (ref 39–117)
BUN: 31 mg/dL — ABNORMAL HIGH (ref 6–23)
CO2: 28 meq/L (ref 19–32)
Calcium: 9.4 mg/dL (ref 8.4–10.5)
Chloride: 102 meq/L (ref 96–112)
Creatinine, Ser: 1.46 mg/dL (ref 0.40–1.50)
GFR: 43.95 mL/min — ABNORMAL LOW (ref >60.00)
Glucose, Bld: 96 mg/dL (ref 70–99)
Potassium: 4 meq/L (ref 3.5–5.1)
Sodium: 141 meq/L (ref 135–145)
Total Bilirubin: 0.4 mg/dL (ref 0.2–1.2)
Total Protein: 7.9 g/dL (ref 6.0–8.3)

## 2024-06-05 LAB — LIPID PANEL
Cholesterol: 122 mg/dL (ref 0–200)
HDL: 49.8 mg/dL (ref >39.00)
LDL Cholesterol: 52 mg/dL (ref 0–99)
NonHDL: 71.78
Total CHOL/HDL Ratio: 2
Triglycerides: 101 mg/dL (ref 0.0–149.0)
VLDL: 20.2 mg/dL (ref 0.0–40.0)

## 2024-06-05 LAB — TSH: TSH: 14.36 u[IU]/mL — ABNORMAL HIGH (ref 0.35–5.50)

## 2024-06-05 NOTE — Progress Notes (Signed)
 Complex Care Management Note  Care Guide Note 06/05/2024 Name: [name] Pratt MRN: [id number] DOB: [birth year]  [name] Pratt is a 83 y.o. year old male who sees Daryl Setter, NP for primary care. I reached out to [name] by phone today to offer complex care management services.  [name] Pratt was given information about Complex Care Management services today including:   The Complex Care Management services include support from the care team which includes your Nurse Care Manager, Clinical Social Worker, or Pharmacist.  The Complex Care Management team is here to help remove barriers to the health concerns and goals most important to you. Complex Care Management services are voluntary, and the patient may decline or stop services at any time by request to their care team member.   Complex Care Management Consent Status: Patient agreed to services and verbal consent obtained.   Follow up plan:  Telephone appointment with complex care management team member scheduled for:  06/09/24 at 12:00 p.m.   Encounter Outcome:  Patient Scheduled  Dreama [facility]  [facility], [facility] VBCI Assistant Direct Dial: [phone]  Fax: [phone]

## 2024-06-05 NOTE — Progress Notes (Signed)
 Complex Care Management Note  Care Guide Note 06/05/2024 Name: [name] Pratt MRN: [id number] DOB: [birth year]  [name] Pratt is a 83 y.o. year old male who sees Daryl Setter, NP for primary care. I reached out to [name] by phone today to offer complex care management services.  [name] was given information about Complex Care Management services today including:   The Complex Care Management services include support from the care team which includes your Nurse Care Manager, Clinical Social Worker, or Pharmacist.  The Complex Care Management team is here to help remove barriers to the health concerns and goals most important to you. Complex Care Management services are voluntary, and the patient may decline or stop services at any time by request to their care team member.   Complex Care Management Consent Status: Patient wishes to consider information provided and/or speak with a member of the care team before deciding to participate in complex care management services.   Follow up plan:  The care guide will reach out to the patient again over the next 7 days.  Encounter Outcome:  Patient Request to Call Back  Dreama [facility]  [facility], [facility] VBCI Assistant Direct Dial: [phone]  Fax: [phone]

## 2024-06-06 ENCOUNTER — Telehealth: Payer: Self-pay

## 2024-06-06 NOTE — Telephone Encounter (Signed)
-----   Message from [place] sent at 06/04/2024  3:22 PM EDT ----- Kiowa Peifer- this gentleman had a sleep study in May and never got back to us . Please get him in with me or any sleep provider to follow up on his sleep apnea. -CDY ----- Message ----- From: Daryl Setter, NP Sent: 06/04/2024   2:35 PM EDT To: [name], MD  Hi Dr. Salt,  I just wanted to touch base with you regarding pt's sleep apnea.  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  Are you managing this? Also, he said he had a home sleep study recently but I can't find that result.  Thanks!  Melissa O'Sullivan FNP-C

## 2024-06-06 NOTE — Telephone Encounter (Signed)
 Called and spoke with patients son, [name] Pratt (on [place]).  Per [name], patient is doing well not being on the CPAP at this time.  Patient has been in the hospital several times recently.  Patients son states his dad is sleeping a lot and seems to been sleeping well.  He states he is an only child and it is hard on him as his dad has a lot of doctor visits and would rather not move forward with putting his dad on a CPAP.  He does not think he needs this.  His dad did have a HST in May 2025.  Patients son is open to discussing the HST results over the phone if Dr. Neysa would like to call him but it is difficult to bring his dad in for an OV unless it is urgently needed.  Dr. Neysa, this is FYI.  Thank you.

## 2024-06-06 NOTE — Telephone Encounter (Signed)
-----   Message from [place] sent at 06/04/2024  3:22 PM EDT ----- [name] Pratt- this gentleman had a sleep study in May and never got back to us . Please get him in with me or any sleep provider to follow up on his sleep apnea. -CDY ----- Message ----- From: Daryl Setter, NP Sent: 06/04/2024   2:35 PM EDT To: [name], MD  Hi Dr. Salt,  I just wanted to touch base with you regarding pt's sleep apnea.  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  Are you managing this? Also, he said he had a home sleep study recently but I can't find that result.  Thanks!  Melissa O'Sullivan FNP-C

## 2024-06-09 ENCOUNTER — Telehealth: Payer: Self-pay | Admitting: Family

## 2024-06-09 ENCOUNTER — Other Ambulatory Visit

## 2024-06-09 ENCOUNTER — Ambulatory Visit: Payer: Self-pay | Admitting: Family

## 2024-06-09 DIAGNOSIS — M5413 Radiculopathy, cervicothoracic region: Secondary | ICD-10-CM | POA: Diagnosis not present

## 2024-06-09 DIAGNOSIS — E039 Hypothyroidism, unspecified: Secondary | ICD-10-CM

## 2024-06-09 MED ORDER — LEVOTHYROXINE SODIUM 150 MCG PO TABS: 150.0000 ug | ORAL_TABLET | Freq: Every day | ORAL | 3 refills | Status: DC

## 2024-06-09 NOTE — Telephone Encounter (Signed)
 Patient's son notified of results and medication dose increase.

## 2024-06-09 NOTE — Telephone Encounter (Signed)
 I reviewed his thyroid  test.  I think that the dose that the rehab put him on is too low.  I would recommend that he restart at 150 mcg once daily which is closer to what he was on previously.

## 2024-06-10 ENCOUNTER — Telehealth: Payer: Self-pay | Admitting: Family

## 2024-06-10 ENCOUNTER — Ambulatory Visit (HOSPITAL_BASED_OUTPATIENT_CLINIC_OR_DEPARTMENT_OTHER)
Admission: RE | Admit: 2024-06-10 | Discharge: 2024-06-10 | Disposition: A | Discharge status: Home / Self Care | Source: Ambulatory Visit | Attending: Family | Admitting: Family

## 2024-06-10 DIAGNOSIS — E1122 Type 2 diabetes mellitus with diabetic chronic kidney disease: Secondary | ICD-10-CM | POA: Diagnosis not present

## 2024-06-10 DIAGNOSIS — E785 Hyperlipidemia, unspecified: Secondary | ICD-10-CM | POA: Diagnosis not present

## 2024-06-10 DIAGNOSIS — I129 Hypertensive chronic kidney disease with stage 1 through stage 4 chronic kidney disease, or unspecified chronic kidney disease: Secondary | ICD-10-CM | POA: Diagnosis not present

## 2024-06-10 DIAGNOSIS — M8580 Other specified disorders of bone density and structure, unspecified site: Secondary | ICD-10-CM | POA: Diagnosis not present

## 2024-06-10 DIAGNOSIS — Z96643 Presence of artificial hip joint, bilateral: Secondary | ICD-10-CM | POA: Diagnosis not present

## 2024-06-10 DIAGNOSIS — E538 Deficiency of other specified B group vitamins: Secondary | ICD-10-CM | POA: Diagnosis not present

## 2024-06-10 DIAGNOSIS — N183 Chronic kidney disease, stage 3 unspecified: Secondary | ICD-10-CM | POA: Diagnosis not present

## 2024-06-10 DIAGNOSIS — M1030 Gout due to renal impairment, unspecified site: Secondary | ICD-10-CM | POA: Diagnosis not present

## 2024-06-10 DIAGNOSIS — I700 Atherosclerosis of aorta: Secondary | ICD-10-CM | POA: Diagnosis not present

## 2024-06-10 DIAGNOSIS — E1143 Type 2 diabetes mellitus with diabetic autonomic (poly)neuropathy: Secondary | ICD-10-CM | POA: Diagnosis not present

## 2024-06-10 DIAGNOSIS — D631 Anemia in chronic kidney disease: Secondary | ICD-10-CM | POA: Diagnosis not present

## 2024-06-10 NOTE — Telephone Encounter (Signed)
 Copied from CRM #[id number]. Topic: General - Other >> Jun 10, 2024 12:37 PM Henretta I wrote: Reason for RMF:[id number] from [facility] homehealth would like a call back to see if faxes were received. They were sent over on 9/22, 9/24, 9/26, and 9/29 for orders for plan of care add on disciplines and physician order, can we please check to see if they were received  Order numbers [id number], [id number], [phone], and [phone] Callback number is [phone]

## 2024-06-10 NOTE — Telephone Encounter (Signed)
 Fax was failing for the last few days, was successfully faxed today

## 2024-06-13 ENCOUNTER — Telehealth: Payer: Self-pay

## 2024-06-13 DIAGNOSIS — E1143 Type 2 diabetes mellitus with diabetic autonomic (poly)neuropathy: Secondary | ICD-10-CM | POA: Diagnosis not present

## 2024-06-13 NOTE — Telephone Encounter (Signed)
 FYI   Copied from CRM [id number]. Topic: General - Other >> Jun 13, 2024  1:56 PM Macario HERO wrote: Reason for CRM: Kristi physical therapist from [facility] called to report patient fell Thursday 06/12/24 and scrape on his right elbow, did not hit his head and was able to get up. Callback: [phone]

## 2024-06-15 ENCOUNTER — Ambulatory Visit (HOSPITAL_BASED_OUTPATIENT_CLINIC_OR_DEPARTMENT_OTHER)

## 2024-06-20 ENCOUNTER — Telehealth: Payer: Self-pay | Admitting: Neurology

## 2024-06-20 NOTE — Telephone Encounter (Signed)
 Copied from CRM [id number]. Topic: General - Other >> Jun 19, 2024  4:29 PM Rosina BIRCH wrote: Reason for CRM: amy from [facility] called trying to get some information regarding the patient last visit. She want to know if they did a urine specimen for a urine analysis  on him [phone]

## 2024-06-21 DIAGNOSIS — M542 Cervicalgia: Secondary | ICD-10-CM | POA: Diagnosis not present

## 2024-06-21 DIAGNOSIS — S0011XA Contusion of right eyelid and periocular area, initial encounter: Secondary | ICD-10-CM | POA: Diagnosis not present

## 2024-06-21 DIAGNOSIS — S299XXA Unspecified injury of thorax, initial encounter: Secondary | ICD-10-CM | POA: Diagnosis not present

## 2024-06-21 DIAGNOSIS — M545 Low back pain, unspecified: Secondary | ICD-10-CM | POA: Diagnosis not present

## 2024-06-21 DIAGNOSIS — W19XXXA Unspecified fall, initial encounter: Secondary | ICD-10-CM | POA: Diagnosis not present

## 2024-06-21 DIAGNOSIS — I129 Hypertensive chronic kidney disease with stage 1 through stage 4 chronic kidney disease, or unspecified chronic kidney disease: Secondary | ICD-10-CM | POA: Diagnosis not present

## 2024-06-21 DIAGNOSIS — Z79899 Other long term (current) drug therapy: Secondary | ICD-10-CM | POA: Diagnosis not present

## 2024-06-21 DIAGNOSIS — S0990XA Unspecified injury of head, initial encounter: Secondary | ICD-10-CM | POA: Diagnosis not present

## 2024-06-21 DIAGNOSIS — S3991XA Unspecified injury of abdomen, initial encounter: Secondary | ICD-10-CM | POA: Diagnosis not present

## 2024-06-21 DIAGNOSIS — Z9181 History of falling: Secondary | ICD-10-CM | POA: Diagnosis not present

## 2024-06-21 DIAGNOSIS — N1831 Chronic kidney disease, stage 3a: Secondary | ICD-10-CM | POA: Diagnosis not present

## 2024-06-21 DIAGNOSIS — M546 Pain in thoracic spine: Secondary | ICD-10-CM | POA: Diagnosis not present

## 2024-06-21 DIAGNOSIS — I440 Atrioventricular block, first degree: Secondary | ICD-10-CM | POA: Diagnosis not present

## 2024-06-21 DIAGNOSIS — E1122 Type 2 diabetes mellitus with diabetic chronic kidney disease: Secondary | ICD-10-CM | POA: Diagnosis not present

## 2024-06-21 DIAGNOSIS — R296 Repeated falls: Secondary | ICD-10-CM | POA: Diagnosis not present

## 2024-06-21 DIAGNOSIS — R531 Weakness: Secondary | ICD-10-CM | POA: Diagnosis not present

## 2024-06-21 DIAGNOSIS — R42 Dizziness and giddiness: Secondary | ICD-10-CM | POA: Diagnosis not present

## 2024-06-21 DIAGNOSIS — E039 Hypothyroidism, unspecified: Secondary | ICD-10-CM | POA: Diagnosis not present

## 2024-06-22 DIAGNOSIS — I452 Bifascicular block: Secondary | ICD-10-CM | POA: Diagnosis not present

## 2024-06-22 DIAGNOSIS — I454 Nonspecific intraventricular block: Secondary | ICD-10-CM | POA: Diagnosis not present

## 2024-06-22 DIAGNOSIS — I493 Ventricular premature depolarization: Secondary | ICD-10-CM | POA: Diagnosis not present

## 2024-06-23 ENCOUNTER — Other Ambulatory Visit (HOSPITAL_BASED_OUTPATIENT_CLINIC_OR_DEPARTMENT_OTHER): Payer: Self-pay

## 2024-06-23 DIAGNOSIS — R296 Repeated falls: Secondary | ICD-10-CM | POA: Diagnosis not present

## 2024-06-23 MED ORDER — CEPHALEXIN 500 MG PO CAPS
500.0000 mg | ORAL_CAPSULE | Freq: Three times a day (TID) | ORAL | 0 refills | Status: DC
  Filled 2024-06-23: qty 9, 3d supply, fill #0

## 2024-06-23 MED ORDER — AMOXICILLIN-POT CLAVULANATE 875-125 MG PO TABS
1.0000 | ORAL_TABLET | Freq: Two times a day (BID) | ORAL | 0 refills | Status: AC
  Filled 2024-06-23: qty 10, 5d supply, fill #0

## 2024-06-23 NOTE — Telephone Encounter (Signed)
 Amy notified

## 2024-06-23 NOTE — Telephone Encounter (Signed)
 Spoke with Amy from [facility] and she stated that patient has had 3 falls in the last week and has seemed to have some confusion.  She wanted to know if we did urine at last visit but we did not.  They did a urine dip at the ER on pt on 06/21/24 which showed some WBCs (>50) but do not see a culture.  She would like to know if we can give an order to check urine.  She thinks he may have UTI.

## 2024-06-23 NOTE — Telephone Encounter (Signed)
 Please advise RN that patient went to [facility] over the weekend and was diagnosed with UTI and was discharged this AM on Augmentin .

## 2024-06-24 ENCOUNTER — Other Ambulatory Visit (HOSPITAL_BASED_OUTPATIENT_CLINIC_OR_DEPARTMENT_OTHER): Payer: Self-pay

## 2024-06-24 DIAGNOSIS — E785 Hyperlipidemia, unspecified: Secondary | ICD-10-CM | POA: Diagnosis not present

## 2024-06-24 DIAGNOSIS — D631 Anemia in chronic kidney disease: Secondary | ICD-10-CM | POA: Diagnosis not present

## 2024-06-24 DIAGNOSIS — M1030 Gout due to renal impairment, unspecified site: Secondary | ICD-10-CM | POA: Diagnosis not present

## 2024-06-24 DIAGNOSIS — E538 Deficiency of other specified B group vitamins: Secondary | ICD-10-CM | POA: Diagnosis not present

## 2024-06-24 DIAGNOSIS — E1122 Type 2 diabetes mellitus with diabetic chronic kidney disease: Secondary | ICD-10-CM | POA: Diagnosis not present

## 2024-06-24 DIAGNOSIS — E1143 Type 2 diabetes mellitus with diabetic autonomic (poly)neuropathy: Secondary | ICD-10-CM | POA: Diagnosis not present

## 2024-06-24 DIAGNOSIS — N183 Chronic kidney disease, stage 3 unspecified: Secondary | ICD-10-CM | POA: Diagnosis not present

## 2024-06-24 DIAGNOSIS — I129 Hypertensive chronic kidney disease with stage 1 through stage 4 chronic kidney disease, or unspecified chronic kidney disease: Secondary | ICD-10-CM | POA: Diagnosis not present

## 2024-06-24 DIAGNOSIS — I700 Atherosclerosis of aorta: Secondary | ICD-10-CM | POA: Diagnosis not present

## 2024-06-25 ENCOUNTER — Ambulatory Visit: Payer: Self-pay

## 2024-06-25 NOTE — Telephone Encounter (Signed)
 FYI Only or Action Required?: FYI only for provider.  Patient was last seen in primary care on 06/04/2024 by Daryl Setter, NP.  Called Nurse Triage reporting Diarrhea.  Symptoms began yesterday.  Interventions attempted: Nothing.  Symptoms are: stable.  Triage Disposition: Home Care  Patient/caregiver understands and will follow disposition?: Yes

## 2024-06-25 NOTE — Telephone Encounter (Signed)
 Reason for Triage: uncontrolled Diarrhea, urinary incontinence. Requesting a call back to discuss current symptoms and recent hospitalization.  Reason for Disposition  [1] MILD diarrhea AND [2] taking antibiotics  Answer Assessment - Initial Assessment Questions Uti at hospital and sent on atbx.      1. DIARRHEA SEVERITY: How bad is the diarrhea? How many more stools have you had in the past 24 hours than normal?      Had a normal and just a little bit of diarrhea 2. ONSET: When did the diarrhea begin?      Day of discharge 3. STOOL DESCRIPTION:  How loose or watery is the diarrhea? What is the stool color? Is there any blood or mucous in the stool?     Started soft and then watery.  4. VOMITING: Are you also vomiting? If Yes, ask: How many times in the past 24 hours?      no 5. ABDOMEN PAIN: Are you having any abdomen pain? If Yes, ask: What does it feel like? (e.g., crampy, dull, intermittent, constant)       6. ABDOMEN PAIN SEVERITY: If present, ask: How bad is the pain?  (e.g., Scale 1-10; mild, moderate, or severe)      7. ORAL INTAKE: If vomiting, Have you been able to drink liquids? How much liquids have you had in the past 24 hours?     yes 8. HYDRATION: Any signs of dehydration? (e.g., dry mouth [not just dry lips], too weak to stand, dizziness, new weight loss) When did you last urinate?     Home health nurse  9. EXPOSURE: Have you traveled to a foreign country recently? Have you been exposed to anyone with diarrhea? Could you have eaten any food that was spoiled?      10. ANTIBIOTIC USE: Are you taking antibiotics now or have you taken antibiotics in the past 2 months?       Yes IV and started at home 11. OTHER SYMPTOMS: Do you have any other symptoms? (e.g., fever, blood in stool) no  Protocols used: Diarrhea-A-AH

## 2024-06-27 ENCOUNTER — Other Ambulatory Visit (HOSPITAL_COMMUNITY): Payer: Self-pay

## 2024-06-27 DIAGNOSIS — E1122 Type 2 diabetes mellitus with diabetic chronic kidney disease: Secondary | ICD-10-CM | POA: Diagnosis not present

## 2024-06-27 DIAGNOSIS — M1030 Gout due to renal impairment, unspecified site: Secondary | ICD-10-CM | POA: Diagnosis not present

## 2024-06-27 DIAGNOSIS — I700 Atherosclerosis of aorta: Secondary | ICD-10-CM | POA: Diagnosis not present

## 2024-06-27 DIAGNOSIS — I129 Hypertensive chronic kidney disease with stage 1 through stage 4 chronic kidney disease, or unspecified chronic kidney disease: Secondary | ICD-10-CM | POA: Diagnosis not present

## 2024-06-27 DIAGNOSIS — N183 Chronic kidney disease, stage 3 unspecified: Secondary | ICD-10-CM | POA: Diagnosis not present

## 2024-06-27 DIAGNOSIS — D631 Anemia in chronic kidney disease: Secondary | ICD-10-CM | POA: Diagnosis not present

## 2024-06-27 DIAGNOSIS — E785 Hyperlipidemia, unspecified: Secondary | ICD-10-CM | POA: Diagnosis not present

## 2024-06-27 DIAGNOSIS — E1143 Type 2 diabetes mellitus with diabetic autonomic (poly)neuropathy: Secondary | ICD-10-CM | POA: Diagnosis not present

## 2024-06-30 DIAGNOSIS — M48062 Spinal stenosis, lumbar region with neurogenic claudication: Secondary | ICD-10-CM | POA: Diagnosis not present

## 2024-07-02 ENCOUNTER — Other Ambulatory Visit (HOSPITAL_BASED_OUTPATIENT_CLINIC_OR_DEPARTMENT_OTHER): Payer: Self-pay

## 2024-07-02 DIAGNOSIS — N1831 Chronic kidney disease, stage 3a: Secondary | ICD-10-CM | POA: Diagnosis not present

## 2024-07-02 MED ORDER — LOSARTAN POTASSIUM 25 MG PO TABS
25.0000 mg | ORAL_TABLET | Freq: Every day | ORAL | 3 refills | Status: DC
  Filled 2024-07-02: qty 90, 90d supply, fill #0
  Filled 2024-08-05 (×2): qty 12, 12d supply, fill #0
  Filled 2024-08-19: qty 30, 30d supply, fill #1

## 2024-07-02 MED ORDER — LOSARTAN POTASSIUM 25 MG PO TABS
25.0000 mg | ORAL_TABLET | Freq: Every day | ORAL | 0 refills | Status: DC
  Filled 2024-07-02: qty 30, 30d supply, fill #0

## 2024-07-03 ENCOUNTER — Other Ambulatory Visit (HOSPITAL_BASED_OUTPATIENT_CLINIC_OR_DEPARTMENT_OTHER): Payer: Self-pay

## 2024-07-03 DIAGNOSIS — M1030 Gout due to renal impairment, unspecified site: Secondary | ICD-10-CM | POA: Diagnosis not present

## 2024-07-03 DIAGNOSIS — E1143 Type 2 diabetes mellitus with diabetic autonomic (poly)neuropathy: Secondary | ICD-10-CM | POA: Diagnosis not present

## 2024-07-03 DIAGNOSIS — I10 Essential (primary) hypertension: Secondary | ICD-10-CM | POA: Diagnosis not present

## 2024-07-03 DIAGNOSIS — I700 Atherosclerosis of aorta: Secondary | ICD-10-CM | POA: Diagnosis not present

## 2024-07-03 DIAGNOSIS — N183 Chronic kidney disease, stage 3 unspecified: Secondary | ICD-10-CM | POA: Diagnosis not present

## 2024-07-03 DIAGNOSIS — I129 Hypertensive chronic kidney disease with stage 1 through stage 4 chronic kidney disease, or unspecified chronic kidney disease: Secondary | ICD-10-CM | POA: Diagnosis not present

## 2024-07-03 DIAGNOSIS — D631 Anemia in chronic kidney disease: Secondary | ICD-10-CM | POA: Diagnosis not present

## 2024-07-03 DIAGNOSIS — E785 Hyperlipidemia, unspecified: Secondary | ICD-10-CM | POA: Diagnosis not present

## 2024-07-03 DIAGNOSIS — E538 Deficiency of other specified B group vitamins: Secondary | ICD-10-CM | POA: Diagnosis not present

## 2024-07-03 DIAGNOSIS — E1122 Type 2 diabetes mellitus with diabetic chronic kidney disease: Secondary | ICD-10-CM | POA: Diagnosis not present

## 2024-07-04 DIAGNOSIS — M1030 Gout due to renal impairment, unspecified site: Secondary | ICD-10-CM | POA: Diagnosis not present

## 2024-07-04 DIAGNOSIS — I129 Hypertensive chronic kidney disease with stage 1 through stage 4 chronic kidney disease, or unspecified chronic kidney disease: Secondary | ICD-10-CM | POA: Diagnosis not present

## 2024-07-04 DIAGNOSIS — E1143 Type 2 diabetes mellitus with diabetic autonomic (poly)neuropathy: Secondary | ICD-10-CM | POA: Diagnosis not present

## 2024-07-04 DIAGNOSIS — E785 Hyperlipidemia, unspecified: Secondary | ICD-10-CM | POA: Diagnosis not present

## 2024-07-04 DIAGNOSIS — D631 Anemia in chronic kidney disease: Secondary | ICD-10-CM | POA: Diagnosis not present

## 2024-07-05 DIAGNOSIS — I10 Essential (primary) hypertension: Secondary | ICD-10-CM | POA: Diagnosis not present

## 2024-07-06 ENCOUNTER — Ambulatory Visit (HOSPITAL_BASED_OUTPATIENT_CLINIC_OR_DEPARTMENT_OTHER)
Admission: RE | Admit: 2024-07-06 | Discharge: 2024-07-06 | Disposition: A | Discharge status: Home / Self Care | Source: Ambulatory Visit | Attending: Family | Admitting: Family

## 2024-07-06 DIAGNOSIS — I6529 Occlusion and stenosis of unspecified carotid artery: Secondary | ICD-10-CM | POA: Diagnosis not present

## 2024-07-06 DIAGNOSIS — E785 Hyperlipidemia, unspecified: Secondary | ICD-10-CM | POA: Diagnosis not present

## 2024-07-06 DIAGNOSIS — I10 Essential (primary) hypertension: Secondary | ICD-10-CM | POA: Diagnosis not present

## 2024-07-07 ENCOUNTER — Telehealth: Payer: Self-pay | Admitting: Family

## 2024-07-07 ENCOUNTER — Encounter: Payer: Self-pay | Admitting: Family

## 2024-07-07 DIAGNOSIS — N3000 Acute cystitis without hematuria: Secondary | ICD-10-CM | POA: Diagnosis not present

## 2024-07-07 DIAGNOSIS — N1831 Chronic kidney disease, stage 3a: Secondary | ICD-10-CM | POA: Diagnosis not present

## 2024-07-07 NOTE — Telephone Encounter (Signed)
 Copied from CRM #[id number]. Topic: Medical Record Request - Other >> Jul 01, 2024 12:44 PM Suzen RAMAN wrote: Reason for CRM: Katlyn from [facility] called to check on the status of a home health order that was faxed 06/23/24. No indication located within patient chart that home health order was received. Fax number confirm and information will be re-faxed. >> Jul 07, 2024 12:41 PM Corin V wrote: Caitlyn is refaxing the orders. She is looking for order [id number] to come through and be signed. Please call her once received to verify this order comes through. Call back: [phone]

## 2024-07-07 NOTE — Telephone Encounter (Signed)
 Orders received will be signed and sent tomorrow

## 2024-07-08 DIAGNOSIS — N1831 Chronic kidney disease, stage 3a: Secondary | ICD-10-CM | POA: Diagnosis not present

## 2024-07-08 DIAGNOSIS — I129 Hypertensive chronic kidney disease with stage 1 through stage 4 chronic kidney disease, or unspecified chronic kidney disease: Secondary | ICD-10-CM | POA: Diagnosis not present

## 2024-07-11 DIAGNOSIS — E1143 Type 2 diabetes mellitus with diabetic autonomic (poly)neuropathy: Secondary | ICD-10-CM | POA: Diagnosis not present

## 2024-07-21 DIAGNOSIS — N183 Chronic kidney disease, stage 3 unspecified: Secondary | ICD-10-CM | POA: Diagnosis not present

## 2024-07-21 DIAGNOSIS — I700 Atherosclerosis of aorta: Secondary | ICD-10-CM | POA: Diagnosis not present

## 2024-07-21 DIAGNOSIS — M1030 Gout due to renal impairment, unspecified site: Secondary | ICD-10-CM | POA: Diagnosis not present

## 2024-07-21 DIAGNOSIS — E1122 Type 2 diabetes mellitus with diabetic chronic kidney disease: Secondary | ICD-10-CM | POA: Diagnosis not present

## 2024-07-21 DIAGNOSIS — E1143 Type 2 diabetes mellitus with diabetic autonomic (poly)neuropathy: Secondary | ICD-10-CM | POA: Diagnosis not present

## 2024-07-21 DIAGNOSIS — D631 Anemia in chronic kidney disease: Secondary | ICD-10-CM | POA: Diagnosis not present

## 2024-07-21 DIAGNOSIS — E785 Hyperlipidemia, unspecified: Secondary | ICD-10-CM | POA: Diagnosis not present

## 2024-07-21 DIAGNOSIS — I129 Hypertensive chronic kidney disease with stage 1 through stage 4 chronic kidney disease, or unspecified chronic kidney disease: Secondary | ICD-10-CM | POA: Diagnosis not present

## 2024-07-22 ENCOUNTER — Ambulatory Visit (INDEPENDENT_AMBULATORY_CARE_PROVIDER_SITE_OTHER): Admitting: Family

## 2024-07-22 ENCOUNTER — Telehealth: Payer: Self-pay

## 2024-07-22 ENCOUNTER — Other Ambulatory Visit (HOSPITAL_BASED_OUTPATIENT_CLINIC_OR_DEPARTMENT_OTHER): Payer: Self-pay

## 2024-07-22 VITALS — BP 158/87 | HR 59 | Temp 97.9°F | Resp 16 | Ht 66.0 in | Wt 129.0 lb

## 2024-07-22 DIAGNOSIS — M1A0720 Idiopathic chronic gout, left ankle and foot, without tophus (tophi): Secondary | ICD-10-CM | POA: Diagnosis not present

## 2024-07-22 DIAGNOSIS — E039 Hypothyroidism, unspecified: Secondary | ICD-10-CM | POA: Diagnosis not present

## 2024-07-22 DIAGNOSIS — Z79899 Other long term (current) drug therapy: Secondary | ICD-10-CM | POA: Diagnosis not present

## 2024-07-22 DIAGNOSIS — R296 Repeated falls: Secondary | ICD-10-CM | POA: Diagnosis not present

## 2024-07-22 MED ORDER — LEVOTHYROXINE SODIUM 150 MCG PO TABS
150.0000 ug | ORAL_TABLET | Freq: Every day | ORAL | 3 refills | Status: DC
  Filled 2024-07-22 (×2): qty 30, 30d supply, fill #0
  Filled 2024-08-21 – 2024-08-22 (×3): qty 30, 30d supply, fill #1

## 2024-07-22 MED ORDER — ALLOPURINOL 100 MG PO TABS
100.0000 mg | ORAL_TABLET | Freq: Two times a day (BID) | ORAL | 2 refills | Status: DC
  Filled 2024-07-22 (×2): qty 60, 30d supply, fill #0
  Filled 2024-08-19: qty 60, 30d supply, fill #1

## 2024-07-22 NOTE — Telephone Encounter (Signed)
 Copied from CRM [id number]. Topic: General - Other >> Jul 22, 2024 12:23 PM Tinnie BROCKS wrote: Reason for CRM: Katelyn from [facility] following up on home health order faxed 10/13. Looks like something was signed and faxed back 10/23. There is also a telephone encounter saying it will be signed and faxed tomorrow on 10/27. Order# is  [id number]. Please follow up at [phone]

## 2024-07-22 NOTE — Progress Notes (Unsigned)
 Subjective:     Patient ID: [name] Pratt, male    DOB: 21-Feb-1941, 83 y.o.   MRN: [id number]  Chief Complaint  Patient presents with   Weakness    Patient reports still  having weakness    HPI  Discussed the use of AI scribe software for clinical note transcription with the patient, who gave verbal consent to proceed.  History of Present Illness [name] Pratt is an 83 year old male who presents with recurrent falls and weakness.  He has experienced multiple falls over the past week, with the most recent fall occurring today. He feels weak, particularly in his legs, despite attempting exercises to build muscle strength. Two nights ago, he fell and required assistance from emergency services, and about a week ago, he sustained a bruise on his head from another fall. No imaging studies, such as a head CT, have been performed since these incidents.  He uses a walker but has fallen both with and without it. He fell while trying to reach for support due to the lack of stable objects to hold onto. He has difficulty balancing and feels unsteady, especially when getting up from bed or moving around the house.  His medication regimen has been altered frequently, leading to confusion. He is currently out of Synthroid  and allopurinol , and there is confusion regarding the correct dosage of Synthroid , with discrepancies between what he was told and what the pharmacy provided. His losartan  dosage is currently at 25 mg, and he has been taken off amlodipine .  He has a urinal at his bedside to minimize the risk of falls when getting up at night. He describes a setup involving a bed rail and a small table to assist with nighttime urination, but finds it challenging to manage independently.  He has a history of back pain for which he has received injections, providing significant relief. However, he has missed some appointments and feels behind on his treatment schedule. He uses sprays to  manage nerve pain and needs support to get comfortable in bed.  He is considering moving to an assisted living facility due to the challenges he faces at home. He is currently receiving some in-home support from [facility] and is exploring long-term care options.  No unusual headaches since hitting his head, but touching the bruise causes discomfort. He reports losing focus at times but does not describe it as a headache.      Health Maintenance Due  Topic Date Due   Medicare Annual Wellness (AWV)  01/20/2023   COVID-19 Vaccine (6 - 2025-26 season) 05/12/2024    Past Medical History:  Diagnosis Date   Acute kidney injury 05/02/2022   Acute metabolic encephalopathy 04/24/2022   Acute respiratory failure with hypoxia (HCC) 09/14/2023   Adenomatous polyp of ascending colon    Adenomatous polyp of colon    Allergic rhinitis 02/02/2016   Anemia 12/17/2009   Formatting of this note might be different from the original. 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 Hypertension   Benign paroxysmal positional vertigo 09/14/2021   Bilateral sacroiliitis 07/22/2021   Body mass index (BMI) 25.0-25.9, adult 11/02/2020   Cancer (HCC)  skin cancer   Cardiac murmur 07/25/2021   Carotid stenosis    a. Carotid U/S 5/13: LICA < 50%, RICA 50-69%;  b.  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   Cystoid macular edema of both eyes 07/28/2020   Degenerative disc disease, lumbar 06/30/2015   Deviated nasal septum 02/18/2015   Diverticulosis 03/03/2020   DJD (degenerative joint disease) of hip 12/24/2012   Drowsiness 12/07/2022   Dry eyes 09/14/2023   Dyspnea 06/12/2011   Emphysema, unspecified (HCC) 03/05/2020   Erectile dysfunction 06/20/2017   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   Fall 09/18/2022   Gait disorder 03/02/2021   Gait instability 03/02/2021   GERD (gastroesophageal reflux disease)    GI bleed 04/21/2022   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   Qualifier: Diagnosis of  By: Gwenn Grimes     Greater trochanteric pain syndrome 07/22/2021   Hand pain, left 01/16/2022   Hand weakness 05/02/2022   Hearing loss 02/02/2016   Hematochezia    Heme positive stool 02/29/2020   Hemorrhagic shock (HCC)    HIATAL HERNIA 05/13/2009   Qualifier: Diagnosis of   By: Gwenn Grimes      Replacing diagnoses that were inactivated after the 12/11/22 regulatory import     High risk medication use 01/18/2012   Hip pain 03/02/2021   History of chicken pox    History of COVID-19 12/05/2021   History of hemorrhoids    History of kidney stones    History of skin cancer 07/22/2021   skin cancer   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  Cardiology - Dr Orlean at [facility] cardiology     ICD-10 cut over     Hyperlipidemia with target LDL less than 130 12/27/2010   Formatting of this note might be different from the original. Cardiology - Dr Orlean at [facility]  cardiology  ICD-10 cut over   Hypokalemia 07/08/2021   Hypothyroidism    Internal hemorrhoids 03/03/2020   Iron  deficiency anemia 09/18/2022   Irregular heart beat 02/03/2022   Leg cramps 11/13/2019   Lower GI bleed 12/12/2012   Lumbago of lumbar region with sciatica 10/21/2020   Lumbar radiculopathy 03/02/2021   Lumbar spondylosis 11/02/2020   NEPHROLITHIASIS 05/13/2009   Qualifier: Diagnosis of  By: Gwenn Grimes     Nonspecific abnormal electrocardiogram (ECG) (EKG) 01/06/2013   Numbness of hand 01/24/2022   Onychomycosis 06/30/2015   OSA (obstructive sleep apnea) 11/25/2012   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 Osteoarthritis Of The Hand  10/1 IMO update   Osteoarthrosis, unspecified whether generalized or localized, pelvic region and thigh 07/25/2010  Formatting of this note might be different from the original. 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  User time 6 hours.  479 days , not used only 12 days - highly compliant 06-23-13  .     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   Urinary retention 11/26/2020   Vasovagal syncope    Ventral hernia 07/08/2012   Vertigo 07/08/2021   Vitamin D  deficiency 08/07/2022   Weakness of lower extremity 10/10/2022   Weight loss 03/03/2020    Past Surgical History:  Procedure Laterality Date   ABDOMINAL HERNIA REPAIR  07/15/12   Dr Kingston   ANTERIOR CERVICAL DECOMP/DISCECTOMY FUSION N/A 07/09/2020   Procedure: ANTERIOR CERVICAL DECOMPRESSION AND FUSION CERVICAL THREE-FOUR.;  Surgeon: Onetha Arley, MD;  Location: [facility];  Service: Neurosurgery;  Laterality: N/A;  anterior   BROW LIFT  05/07/01   COLONOSCOPY WITH PROPOFOL  N/A 04/24/2022   Procedure: COLONOSCOPY WITH PROPOFOL ;  Surgeon: Eda Iha, MD;  Location: [facility] ENDOSCOPY;  Service: Gastroenterology;  Laterality: N/A;   COLONOSCOPY WITH PROPOFOL  N/A 04/26/2022   Procedure: COLONOSCOPY WITH PROPOFOL ;  Surgeon: Eda Iha, MD;  Location: [facility] ENDOSCOPY;  Service: Gastroenterology;  Laterality: N/A;   CYSTOSCOPY WITH URETEROSCOPY AND STENT PLACEMENT Right 01/28/2014   Procedure: CYSTOSCOPY WITH URETEROSCOPY, BASKET RETRIVAL AND  STENT PLACEMENT;  Surgeon: Alm GORMAN Fragmin, MD;  Location: [facility] ORS;  Service: Urology;  Laterality: Right;   epidural injections     multiple procedures   ESOPHAGEAL DILATION  1993 and 1994   multiple times   EYE SURGERY Bilateral 03/25/10, 2012   cataract removal   HEMORRHOID SURGERY  2014   HIATAL HERNIA REPAIR  12/21/93   HOLMIUM LASER APPLICATION Right 01/28/2014   Procedure: HOLMIUM LASER APPLICATION;  Surgeon: Alm GORMAN Fragmin, MD;   Location: [facility] ORS;  Service: Urology;  Laterality: Right;   INGUINAL HERNIA REPAIR Right 07/15/12   Dr Kingston, x2   JOINT REPLACEMENT  07/25/04   right knee   JOINT REPLACEMENT  07/13/10   left knee   KNEE SURGERY  1995   LAPAROSCOPIC CHOLECYSTECTOMY  09/20/06   with intraoperative cholangiogram and right inguinal herniorrhaphy with mesh    LIGAMENT REPAIR Left 07/2013   shoulder   LITHOTRIPSY     x2   LUMBAR  LAMINECTOMY/DECOMPRESSION MICRODISCECTOMY Bilateral 12/27/2022   Procedure: Lumbar Two-Three, Lumbar Three-Four Sublaminar Decompression;  Surgeon: Onetha Kuba, MD;  Location: [facility] OR;  Service: Neurosurgery;  Laterality: Bilateral;   NASAL SEPTOPLASTY W/ TURBINOPLASTY Bilateral 03/31/2024   Procedure: SEPTOPLASTY, NOSE, WITH NASAL TURBINATE REDUCTION;  Surgeon: Karis Clunes, MD;  Location: [facility];  Service: ENT;  Laterality: Bilateral;   NASAL SEPTUM SURGERY  1975   POLYPECTOMY  04/26/2022   Procedure: POLYPECTOMY;  Surgeon: Eda Iha, MD;  Location: [facility] ENDOSCOPY;  Service: Gastroenterology;;   POSTERIOR CERVICAL FUSION/FORAMINOTOMY N/A 11/24/2020   Procedure: Posterior Cervical Laminectomy Cervical three-four, Cervical four-five with lateral mass fusion;  Surgeon: Onetha Kuba, MD;  Location: [facility] OR;  Service: Neurosurgery;  Laterality: N/A;   REFRACTIVE SURGERY Bilateral    Right total hip replacement  4/14   SKIN CANCER DESTRUCTION     nose, ear   TONSILLECTOMY  as child   TOTAL HIP ARTHROPLASTY Left    URETHRAL DILATION  1991   Dr. 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   2 grandchildren   Has worked for warden/ranger   Enjoys wood working   Completed HS.  [employer]   Social Drivers of Corporate Investment Banker Strain: Low Risk  (10/28/2021)   Overall Financial Resource Strain (CARDIA)    Difficulty of Paying Living Expenses: Not very hard  Food Insecurity: Low Risk  (04/23/2024)   Received from [facility]   Hunger Vital Sign    Within the past 12 months, you worried that your food would run out before you got money to buy more: Never true    Within the past 12 months, the food you bought just didn't last and you didn't have money to get more. : Never true  Transportation Needs: No Transportation Needs (04/23/2024)   Received from [facility]    In the past 12 months, has lack of reliable transportation kept you from medical appointments, meetings, work or from getting things needed for daily living? : No  Physical Activity: Inactive (03/03/2024)   Exercise Vital Sign    Days of Exercise per Week: 0 days    Minutes of Exercise per Session: 0 min  Stress: Stress Concern Present (03/03/2024)   [employer]  of Occupational Health - Occupational Stress Questionnaire    Feeling of Stress: To some extent  Social Connections: Moderately Isolated (09/14/2023)   Social Connection and Isolation Panel    Frequency of Communication with Friends and Family: Three times a week    Frequency of  Social Gatherings with Friends and Family: Three times a week    Attends Religious Services: Never    Active Member of Clubs or Organizations: No    Attends Banker Meetings: Never    Marital Status: Married  Catering Manager Violence: Not At Risk (03/03/2024)   Humiliation, Afraid, Rape, and Kick questionnaire    Fear of Current or Ex-Partner: No    Emotionally Abused: No    Physically Abused: No    Sexually Abused: No    Outpatient Medications Prior to Visit  Medication Sig Dispense Refill   acetaminophen  (TYLENOL ) 500 MG tablet Take 500 mg by mouth at bedtime as needed for moderate pain (pain score 4-6).     albuterol  (VENTOLIN  HFA) 108 (90 Base) MCG/ACT inhaler Inhale 2 puffs into the lungs every 6 (six) hours as needed for wheezing or shortness of breath. 6.7 g 1   antiseptic oral rinse (BIOTENE) LIQD 15 mLs by Mouth Rinse route 2 (two) times daily.     atorvastatin  (LIPITOR) 10 MG tablet Take 1 tablet (10 mg total) by mouth daily. 90 tablet 3   benzonatate  (TESSALON ) 200 MG capsule Take 1 capsule (200 mg total) by mouth 3 (three) times daily as needed for cough. 20 capsule 0   diclofenac  Sodium (VOLTAREN ) 1 % GEL Apply topically.     docusate sodium  (COLACE) 100 MG capsule Take 1 capsule (100 mg total) by mouth every 12 (twelve) hours. 100 capsule 0   gabapentin  (NEURONTIN ) 300 MG capsule Take 1 capsule (300 mg total) by mouth at bedtime. 90 capsule 1   glucose blood test strip TRUE Metrix Blood Glucose Test Strip, use as instructed 100 each 12   hydrochlorothiazide  (HYDRODIURIL ) 25 MG tablet Take 1 tablet (25 mg total) by mouth daily. 90 tablet 0   Lancets MISC 1 each by Does not apply route 2 (two) times daily. TRUE matrix lancets 100 each 3   loratadine  (CLARITIN ) 10 MG tablet Take 10 mg by mouth daily as needed for allergies or rhinitis.     losartan  (COZAAR ) 25 MG tablet Take 1 tablet (25 mg total) by mouth daily. 90 tablet 0   losartan  (COZAAR ) 25 MG tablet Take 1  tablet (25 mg total) by mouth daily. 30 tablet 0   losartan  (COZAAR ) 25 MG tablet Take 1 tablet (25 mg total) by mouth daily. 90 tablet 3   Magnesium  500 MG TABS Take 500 mg by mouth daily.     Menthol , Topical Analgesic, (ICY HOT EX) Apply 1 Application topically at bedtime. Lidocaine  / on back     methocarbamol  (ROBAXIN ) 500 MG tablet Take 1 tablet (500 mg total) by mouth every 8 (eight) hours as needed for muscle spasms. 30 tablet 0   metroNIDAZOLE  (METROCREAM ) 0.75 % cream Apply 1 Application topically daily as needed (head).     multivitamin-lutein  (OCUVITE-LUTEIN ) CAPS capsule Take 1 capsule by mouth daily.     mupirocin  ointment (BACTROBAN ) 2 % Apply 1 Application topically 2 (two) times daily. 22 g 2   nitroGLYCERIN  (NITROSTAT ) 0.4 MG SL tablet Place 1 tablet (0.4 mg total) under the tongue every 5 (five) minutes as needed for chest pain. 25 tablet 6   omeprazole  (  PRILOSEC) 20 MG capsule Take 1 capsule (20 mg total) by mouth in the morning. 90 capsule 0   ondansetron  (ZOFRAN ) 4 MG tablet Take 1 tablet (4 mg total) by mouth every 8 (eight) hours as needed for nausea or vomiting. 30 tablet 0   OVER THE COUNTER MEDICATION Place 1 spray into both nostrils 2 (two) times daily. Sinus saline     polyethylene glycol powder (GLYCOLAX /MIRALAX ) 17 GM/SCOOP powder Take 17 g by mouth 2 (two) times daily as needed for mild constipation, moderate constipation or severe constipation. 255 g 0   polyvinyl alcohol  (ARTIFICIAL TEARS) 1.4 % ophthalmic solution Place 1 drop into both eyes daily as needed for dry eyes.     Vibegron (GEMTESA) 75 MG TABS Take 75 mg by mouth daily.     vitamin B-12 (CYANOCOBALAMIN ) 500 MCG tablet Take 500 mcg by mouth in the morning.     allopurinol  (ZYLOPRIM ) 100 MG tablet Take 1 tablet (100 mg total) by mouth 2 (two) times daily. (Patient taking differently: Take 200 mg by mouth daily.) 180 tablet 1   amLODipine  (NORVASC ) 10 MG tablet Take 1 tablet (10 mg total) by mouth daily. 90  tablet 0   levothyroxine  (SYNTHROID ) 150 MCG tablet Take 1 tablet (150 mcg total) by mouth daily. 30 tablet 3   No facility-administered medications prior to visit.    Allergies  Allergen Reactions   Pravastatin  Other (See Comments)    Muscle cramps   Sildenafil  Other (See Comments)    Tachycardia     Oxycodone  Other (See Comments)    Hallucinations    Atenolol Other (See Comments)    Bradycardia    Metoclopramide Hcl Anxiety    ROS See HPI    Objective:    Physical Exam Constitutional:      General: He is not in acute distress.    Appearance: He is well-developed.  HENT:     Head: Normocephalic.     Comments: Some old bruising noted right forehead Cardiovascular:     Rate and Rhythm: Normal rate and regular rhythm.     Heart sounds: No murmur heard. Pulmonary:     Effort: Pulmonary effort is normal. No respiratory distress.     Breath sounds: Normal breath sounds. No wheezing or rales.  Skin:    General: Skin is warm and dry.  Neurological:     Mental Status: He is alert and oriented to person, place, and time.  Psychiatric:        Behavior: Behavior normal.        Thought Content: Thought content normal.      BP (!) 158/87   Pulse (!) 59   Temp 97.9 F (36.6 C) (Oral)   Resp 16   Ht 5' 6 (1.676 m)   Wt 129 lb (58.5 kg)   SpO2 100%   BMI 20.82 kg/m  Wt Readings from Last 3 Encounters:  07/22/24 129 lb (58.5 kg)  06/04/24 134 lb 12.8 oz (61.1 kg)  03/31/24 151 lb 3.8 oz (68.6 kg)       Assessment & Plan:   Problem List Items Addressed This Visit       Unprioritized   Recurrent falls - Primary    Recurrent falls with lower extremity weakness and balance issues. Inconsistent walker use and financial constraints limit assisted living options. - Encouraged consistent walker use. - Advised urination at bedside to reduce fall risk. - Discussed potential for assisted living. - Continue home therapy for mobility. -  Also discussed with son  need for higher level of care such as ALF.  The patient does have long term care insurance and son is looking into this possibility.  - He continues to have paid caregivers come in 4 hours a day.  He just really needs someone to assist him with ambulation to prevent falls.       Polypharmacy   Relevant Orders   AMB Referral [facility] Care Management   Hypothyroidism    Confusion over levothyroxine  dosage due to discrepancies between pharmacy records and patient's understanding. - Sent 30-day supply of levothyroxine . - Checked follow-up thyroid  function tests.      Relevant Medications   levothyroxine  (SYNTHROID ) 150 MCG tablet   Other Relevant Orders   TSH   GOUT   Stable on allopurinol , continue same.       Relevant Medications   allopurinol  (ZYLOPRIM ) 100 MG tablet      I have discontinued Daivik B. Salyers [name]'s amLODipine . I am also having him maintain his glucose blood, Lancets, Magnesium , vitamin B-12, loratadine , antiseptic oral rinse, artificial tears, acetaminophen , (Menthol , Topical Analgesic, (ICY HOT EX)), metroNIDAZOLE , OVER THE COUNTER MEDICATION, multivitamin-lutein , polyethylene glycol powder, gabapentin , diclofenac  Sodium, hydrochlorothiazide , mupirocin  ointment, docusate sodium , losartan , methocarbamol , nitroGLYCERIN , benzonatate , omeprazole , ondansetron , albuterol , atorvastatin , Gemtesa, losartan , losartan , allopurinol , and levothyroxine .  Meds ordered this encounter  Medications   allopurinol  (ZYLOPRIM ) 100 MG tablet    Sig: Take 1 tablet (100 mg total) by mouth 2 (two) times daily.    Dispense:  60 tablet    Refill:  2    Supervising Provider:   DOMENICA BLACKBIRD A [[id number]]   levothyroxine  (SYNTHROID ) 150 MCG tablet    Sig: Take 1 tablet (150 mcg total) by mouth daily.    Dispense:  30 tablet    Refill:  3    Supervising Provider:   DOMENICA BLACKBIRD A [[id number]]

## 2024-07-22 NOTE — Patient Instructions (Addendum)
 MyChart Questions. Click here to get support for your MyChart account. ; Billing/Financial Questions  [phone] or [phone]   VISIT SUMMARY: Today, we discussed your recent falls and weakness, particularly in your legs. We reviewed your medication regimen and addressed the confusion regarding your Synthroid  dosage. We also talked about your chronic gout, hypothyroidism, and chronic kidney disease. Additionally, we explored options for assisted living and ways to reduce your fall risk at home.  YOUR PLAN: -RECURRENT FALLS AND LOWER EXTREMITY WEAKNESS: You have been experiencing falls and weakness in your legs, which makes it difficult for you to balance and move around. We encourage you to use your walker consistently and urinate at your bedside to reduce the risk of falls. We also discussed the possibility of moving to an assisted living facility and continuing home therapy to improve your mobility.  -IDIOPATHIC CHRONIC GOUT, LEFT ANKLE AND FOOT: Chronic gout is a type of arthritis that causes pain and inflammation in your joints, particularly in your left ankle and foot. We have sent a 90-day supply of allopurinol  to help manage this condition.  -HYPOTHYROIDISM: Hypothyroidism is a condition where your thyroid  gland does not produce enough thyroid  hormone. We have sent a 30-day supply of levothyroxine  to help manage this condition and will check your thyroid  function tests to ensure the dosage is correct.  -STAGE 3B CHRONIC KIDNEY DISEASE: Chronic kidney disease is a condition where your kidneys are not functioning as well as they should. We will continue to monitor your kidney function and manage your medications accordingly.  -POLYPHARMACY AND MEDICATION MANAGEMENT CHALLENGES: You are taking multiple medications, which has led to some confusion, especially with your levothyroxine  and losartan  dosages. We have sent a 30-day supply of levothyroxine  and allopurinol  and will coordinate with your  pharmacy to manage your medications better. We also provided information on setting up a MyChart proxy account to help with medication management.  INSTRUCTIONS: Please use your walker consistently and urinate at your bedside to reduce the risk of falls. Continue with home therapy for mobility. Take the prescribed medications as directed: allopurinol  for gout and levothyroxine  for hypothyroidism. We will check your thyroid  function tests to ensure the correct dosage. Consider the option of moving to an assisted living facility for additional support. Set up a MyChart proxy account for better medication management. Follow up with us  if you have any concerns or experience any new symptoms.

## 2024-07-22 NOTE — Telephone Encounter (Signed)
 Form scanned into chart. Form printed and refaxed to [facility] at [phone].

## 2024-07-23 DIAGNOSIS — R296 Repeated falls: Secondary | ICD-10-CM | POA: Insufficient documentation

## 2024-07-23 NOTE — Assessment & Plan Note (Signed)
  Recurrent falls with lower extremity weakness and balance issues. Inconsistent walker use and financial constraints limit assisted living options. - Encouraged consistent walker use. - Advised urination at bedside to reduce fall risk. - Discussed potential for assisted living. - Continue home therapy for mobility. - Also discussed with son need for higher level of care such as ALF.  The patient does have long term care insurance and son is looking into this possibility.  - He continues to have paid caregivers come in 4 hours a day.  He just really needs someone to assist him with ambulation to prevent falls.

## 2024-07-23 NOTE — Assessment & Plan Note (Signed)
Stable on allopurinol, continue same.  

## 2024-07-23 NOTE — Assessment & Plan Note (Signed)
  Confusion over levothyroxine  dosage due to discrepancies between pharmacy records and patient's understanding. - Sent 30-day supply of levothyroxine . - Checked follow-up thyroid  function tests.

## 2024-07-27 ENCOUNTER — Other Ambulatory Visit: Payer: Self-pay

## 2024-07-27 ENCOUNTER — Encounter (HOSPITAL_COMMUNITY): Payer: Self-pay

## 2024-07-27 ENCOUNTER — Emergency Department (HOSPITAL_COMMUNITY)
Admission: EM | Admit: 2024-07-27 | Discharge: 2024-07-27 | Disposition: A | Discharge status: Home / Self Care | Attending: Emergency Medicine | Admitting: Emergency Medicine

## 2024-07-27 ENCOUNTER — Emergency Department (HOSPITAL_COMMUNITY)

## 2024-07-27 DIAGNOSIS — Y93E1 Activity, personal bathing and showering: Secondary | ICD-10-CM | POA: Insufficient documentation

## 2024-07-27 DIAGNOSIS — M5459 Other low back pain: Secondary | ICD-10-CM | POA: Insufficient documentation

## 2024-07-27 DIAGNOSIS — M51369 Other intervertebral disc degeneration, lumbar region without mention of lumbar back pain or lower extremity pain: Secondary | ICD-10-CM | POA: Diagnosis not present

## 2024-07-27 DIAGNOSIS — Z043 Encounter for examination and observation following other accident: Secondary | ICD-10-CM | POA: Diagnosis not present

## 2024-07-27 DIAGNOSIS — Z96643 Presence of artificial hip joint, bilateral: Secondary | ICD-10-CM | POA: Diagnosis not present

## 2024-07-27 DIAGNOSIS — W19XXXA Unspecified fall, initial encounter: Secondary | ICD-10-CM | POA: Diagnosis not present

## 2024-07-27 DIAGNOSIS — E860 Dehydration: Secondary | ICD-10-CM | POA: Diagnosis not present

## 2024-07-27 DIAGNOSIS — M545 Low back pain, unspecified: Secondary | ICD-10-CM | POA: Diagnosis not present

## 2024-07-27 DIAGNOSIS — R519 Headache, unspecified: Secondary | ICD-10-CM | POA: Diagnosis present

## 2024-07-27 DIAGNOSIS — S3992XA Unspecified injury of lower back, initial encounter: Secondary | ICD-10-CM | POA: Diagnosis not present

## 2024-07-27 DIAGNOSIS — Z471 Aftercare following joint replacement surgery: Secondary | ICD-10-CM | POA: Diagnosis not present

## 2024-07-27 DIAGNOSIS — M47816 Spondylosis without myelopathy or radiculopathy, lumbar region: Secondary | ICD-10-CM | POA: Diagnosis not present

## 2024-07-27 LAB — CBC
HCT: 34 % — ABNORMAL LOW (ref 39.0–52.0)
Hemoglobin: 11.3 g/dL — ABNORMAL LOW (ref 13.0–17.0)
MCH: 32.5 pg (ref 26.0–34.0)
MCHC: 33.2 g/dL (ref 30.0–36.0)
MCV: 97.7 fL (ref 80.0–100.0)
Platelets: 322 K/uL (ref 150–400)
RBC: 3.48 MIL/uL — ABNORMAL LOW (ref 4.22–5.81)
RDW: 14.1 % (ref 11.5–15.5)
WBC: 7 K/uL (ref 4.0–10.5)
nRBC: 0 % (ref 0.0–0.2)

## 2024-07-27 LAB — COMPREHENSIVE METABOLIC PANEL WITH GFR
ALT: 11 U/L (ref 0–44)
AST: 29 U/L (ref 15–41)
Albumin: 3.7 g/dL (ref 3.5–5.0)
Alkaline Phosphatase: 107 U/L (ref 38–126)
Anion gap: 10 (ref 5–15)
BUN: 19 mg/dL (ref 8–23)
CO2: 25 mmol/L (ref 22–32)
Calcium: 9.3 mg/dL (ref 8.9–10.3)
Chloride: 109 mmol/L (ref 98–111)
Creatinine, Ser: 1.08 mg/dL (ref 0.61–1.24)
GFR, Estimated: >60 mL/min (ref >60)
Glucose, Bld: 100 mg/dL — ABNORMAL HIGH (ref 70–99)
Potassium: 3.9 mmol/L (ref 3.5–5.1)
Sodium: 144 mmol/L (ref 135–145)
Total Bilirubin: 0.7 mg/dL (ref 0.0–1.2)
Total Protein: 6.6 g/dL (ref 6.5–8.1)

## 2024-07-27 MED ORDER — SODIUM CHLORIDE 0.9 % IV BOLUS
1000.0000 mL | Freq: Once | INTRAVENOUS | Status: AC
  Administered 2024-07-27: 1000 mL via INTRAVENOUS

## 2024-07-27 NOTE — ED Triage Notes (Signed)
 Patient BIB [facility] from home. Had a fall 2 days ago when trying to get out of the shower. Has had increased falls lately. Has a bruise on left buttocks. Has a headache. Does not think he hit his head when falling. No blood thinners.

## 2024-07-27 NOTE — Discharge Instructions (Signed)
 Take Tylenol  for pain.  Drink plenty of fluids.  Follow-up with your family doctor if any problems

## 2024-07-29 NOTE — ED Provider Notes (Signed)
 [place] EMERGENCY DEPARTMENT AT [facility] Provider Note   CSN: [id number] Arrival date & time: 07/27/24  1030     Patient presents with: [name] Pratt KATHEE [name] Pratt is a 83 y.o. male.   Patient fell while he was got out of the shower.  Patient has been weak recently according to his son  The history is provided by the patient and medical records. No language interpreter was used.  Fall This is a recurrent problem. The current episode started 12 to 24 hours ago. The problem occurs rarely. The problem has been resolved. Pertinent negatives include no chest pain, no abdominal pain and no headaches. Nothing aggravates the symptoms. Nothing relieves the symptoms. He has tried nothing for the symptoms.       Prior to Admission medications   Medication Sig Start Date End Date Taking? Authorizing Provider  acetaminophen  (TYLENOL ) 500 MG tablet Take 500 mg by mouth at bedtime as needed for moderate pain (pain score 4-6).    [provider]  albuterol  (VENTOLIN  HFA) 108 (90 Base) MCG/ACT inhaler Inhale 2 puffs into the lungs every 6 (six) hours as needed for wheezing or shortness of breath. 06/02/24   O'Sullivan, Melissa, NP  allopurinol  (ZYLOPRIM ) 100 MG tablet Take 1 tablet (100 mg total) by mouth 2 (two) times daily. 07/22/24   O'Sullivan, Melissa, NP  antiseptic oral rinse (BIOTENE) LIQD 15 mLs by Mouth Rinse route 2 (two) times daily.    [provider]  atorvastatin  (LIPITOR) 10 MG tablet Take 1 tablet (10 mg total) by mouth daily. 06/02/24   O'Sullivan, Melissa, NP  benzonatate  (TESSALON ) 200 MG capsule Take 1 capsule (200 mg total) by mouth 3 (three) times daily as needed for cough. 06/02/24   O'Sullivan, Melissa, NP  diclofenac  Sodium (VOLTAREN ) 1 % GEL Apply topically. 07/21/22   [provider]  docusate sodium  (COLACE) 100 MG capsule Take 1 capsule (100 mg total) by mouth every 12 (twelve) hours. 05/24/24   Mannie Pac T, DO  gabapentin   (NEURONTIN ) 300 MG capsule Take 1 capsule (300 mg total) by mouth at bedtime. 07/17/23   Daryl Setter, NP  glucose blood test strip TRUE Metrix Blood Glucose Test Strip, use as instructed 11/12/20   O'Sullivan, Melissa, NP  hydrochlorothiazide  (HYDRODIURIL ) 25 MG tablet Take 1 tablet (25 mg total) by mouth daily. 03/10/24   O'Sullivan, Melissa, NP  Lancets MISC 1 each by Does not apply route 2 (two) times daily. TRUE matrix lancets 11/12/20   Daryl Setter, NP  levothyroxine  (SYNTHROID ) 150 MCG tablet Take 1 tablet (150 mcg total) by mouth daily. 07/22/24   O'Sullivan, Melissa, NP  loratadine  (CLARITIN ) 10 MG tablet Take 10 mg by mouth daily as needed for allergies or rhinitis.    [provider]  losartan  (COZAAR ) 25 MG tablet Take 1 tablet (25 mg total) by mouth daily. 06/02/24 06/02/25  O'Sullivan, Melissa, NP  losartan  (COZAAR ) 25 MG tablet Take 1 tablet (25 mg total) by mouth daily. 07/02/24     losartan  (COZAAR ) 25 MG tablet Take 1 tablet (25 mg total) by mouth daily. 07/02/24     Magnesium  500 MG TABS Take 500 mg by mouth daily.    [provider]  Menthol , Topical Analgesic, (ICY HOT EX) Apply 1 Application topically at bedtime. Lidocaine  / on back    [provider]  methocarbamol  (ROBAXIN ) 500 MG tablet Take 1 tablet (500 mg total) by mouth every 8 (eight) hours as needed for muscle spasms.  06/02/24   O'Sullivan, Melissa, NP  metroNIDAZOLE  (METROCREAM ) 0.75 % cream Apply 1 Application topically daily as needed (head).    [provider]  multivitamin-lutein  (OCUVITE-LUTEIN ) CAPS capsule Take 1 capsule by mouth daily.    [provider]  mupirocin  ointment (BACTROBAN ) 2 % Apply 1 Application topically 2 (two) times daily. 03/12/24   Sikora, Rebecca, DPM  nitroGLYCERIN  (NITROSTAT ) 0.4 MG SL tablet Place 1 tablet (0.4 mg total) under the tongue every 5 (five) minutes as needed for chest pain. 06/02/24   O'Sullivan, Melissa, NP  omeprazole   (PRILOSEC) 20 MG capsule Take 1 capsule (20 mg total) by mouth in the morning. 06/02/24   O'Sullivan, Melissa, NP  ondansetron  (ZOFRAN ) 4 MG tablet Take 1 tablet (4 mg total) by mouth every 8 (eight) hours as needed for nausea or vomiting. 06/02/24   Daryl Setter, NP  OVER THE COUNTER MEDICATION Place 1 spray into both nostrils 2 (two) times daily. Sinus saline    [provider]  polyethylene glycol powder (GLYCOLAX /MIRALAX ) 17 GM/SCOOP powder Take 17 g by mouth 2 (two) times daily as needed for mild constipation, moderate constipation or severe constipation. 12/30/22   Geiple, Joshua, PA-C  polyvinyl alcohol  (ARTIFICIAL TEARS) 1.4 % ophthalmic solution Place 1 drop into both eyes daily as needed for dry eyes.    [provider]  Vibegron (GEMTESA) 75 MG TABS Take 75 mg by mouth daily.    [provider]  vitamin B-12 (CYANOCOBALAMIN ) 500 MCG tablet Take 500 mcg by mouth in the morning.    [provider]    Allergies: Pravastatin , Sildenafil , Oxycodone , Atenolol, and Metoclopramide hcl    Review of Systems  Constitutional:  Negative for appetite change and fatigue.  HENT:  Negative for congestion, ear discharge and sinus pressure.   Eyes:  Negative for discharge.  Respiratory:  Negative for cough.   Cardiovascular:  Negative for chest pain.  Gastrointestinal:  Negative for abdominal pain and diarrhea.  Genitourinary:  Negative for frequency and hematuria.  Musculoskeletal:  Negative for back pain.       Pain in the right buttock  Skin:  Negative for rash.  Neurological:  Negative for seizures and headaches.  Psychiatric/Behavioral:  Negative for hallucinations.     Updated Vital Signs BP (!) 183/83   Pulse 83   Temp 97.6 F (36.4 C) (Oral)   Resp 19   Ht 5' 6 (1.676 m)   Wt 58 kg   SpO2 98%   BMI 20.64 kg/m   Physical Exam Vitals and nursing note reviewed.  Constitutional:      Appearance: He is well-developed.  HENT:     Head:  Normocephalic.     Nose: Nose normal.  Eyes:     General: No scleral icterus.    Conjunctiva/sclera: Conjunctivae normal.  Neck:     Thyroid : No thyromegaly.  Cardiovascular:     Rate and Rhythm: Normal rate and regular rhythm.     Heart sounds: No murmur heard.    No friction rub. No gallop.  Pulmonary:     Breath sounds: No stridor. No wheezing or rales.  Chest:     Chest wall: No tenderness.  Abdominal:     General: There is no distension.     Tenderness: There is no abdominal tenderness. There is no rebound.  Genitourinary:    Comments: Tenderness to right buttock Musculoskeletal:        General: Normal range of motion.     Cervical back:  Neck supple.  Lymphadenopathy:     Cervical: No cervical adenopathy.  Skin:    Findings: No erythema or rash.  Neurological:     Mental Status: He is alert and oriented to person, place, and time.     Motor: No abnormal muscle tone.     Coordination: Coordination normal.  Psychiatric:        Behavior: Behavior normal.     (all labs ordered are listed, but only abnormal results are displayed) Labs Reviewed  COMPREHENSIVE METABOLIC PANEL WITH GFR - Abnormal; Notable for the following components:      Result Value   Glucose, Bld 100 (*)    All other components within normal limits  CBC - Abnormal; Notable for the following components:   RBC 3.48 (*)    Hemoglobin 11.3 (*)    HCT 34.0 (*)    All other components within normal limits    EKG: EKG Interpretation Date/Time:  Sunday July 27 2024 10:42:06 EST Ventricular Rate:  55 PR Interval:  228 QRS Duration:  147 QT Interval:  465 QTC Calculation: 445 R Axis:   -70  Text Interpretation: Age not entered, assumed to be  83 years old for purpose of ECG interpretation Sinus or ectopic atrial rhythm Prolonged PR interval IVCD, consider atypical RBBB Abnormal lateral Q waves Anteroseptal infarct, age indeterminate similar to Sept 2025 Confirmed by Freddi Hamilton [phone]) on  07/28/2024 9:05:17 AM  Radiology: [facility] Pelvis Portable Result Date: 07/27/2024 EXAM: 1 or 2 VIEW(S) XRAY OF THE PELVIS 07/27/2024 12:28:00 PM COMPARISON: 02/22/2024 CLINICAL HISTORY: fall FINDINGS: BONES AND JOINTS: 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 No signs of acute fracture or dislocation. No focal osseous lesion. SOFT TISSUES: The soft tissues are unremarkable. IMPRESSION: 1. No acute fracture or dislocation. Electronically signed by: Waddell Calk MD 07/27/2024 12:43 PM EST RP Workstation: [id number]   CT Lumbar Spine Wo Contrast Result Date: 07/27/2024 CLINICAL DATA:  Ataxia, lumbar trauma Fall 2 days ago.  Left buttocks bruising. EXAM: CT LUMBAR SPINE WITHOUT CONTRAST TECHNIQUE: Multidetector CT imaging of the lumbar spine was performed without intravenous contrast administration. Multiplanar CT image reconstructions were also generated. RADIATION DOSE REDUCTION: This exam was performed according to the departmental dose-optimization program which includes automated exposure control, adjustment of the mA and/or kV according to patient size and/or use of iterative reconstruction technique. COMPARISON:  Lumbar 09/14/2023. CT of the thoracolumbar spine 04/24/2019 FINDINGS: Segmentation: There are 5 lumbar type vertebral bodies Alignment: Stable convex left scoliosis and grade 1 anterolisthesis at L5-S1. Vertebrae: No evidence of acute fracture or traumatic subluxation. 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 There are stable postsurgical changes from previous L2-3 and L3-4 bilateral laminectomies. Paraspinal and other soft tissues: No acute paraspinal findings are demonstrated. There is 1.2 cm nonobstructing calculus in the interpolar region of the right kidney which appears stable. Aortoiliac atherosclerosis noted. Disc levels: Stable multilevel spondylosis with disc space narrowing, endplate osteophytes and bilateral facet hypertrophy mild spinal stenosis at  L1-2. Mild to moderate foraminal narrowing at multiple levels, unchanged. IMPRESSION: 1. No evidence of acute lumbar spine fracture, traumatic subluxation or static signs of instability. 2. Stable postsurgical changes from previous L2-3 and L3-4 bilateral laminectomies. 3. 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 4. Stable nonobstructing right renal calculus. 5.  Aortic Atherosclerosis (ICD10-I70.0). Electronically Signed   By: Elsie Perone M.D.   On: 07/27/2024 12:06  Procedures   Medications Ordered in the ED  sodium chloride  0.9 % bolus 1,000 mL (0 mLs Intravenous Stopped 07/27/24 1331)                                    Medical Decision Making Amount and/or Complexity of Data Reviewed Labs: ordered. Radiology: ordered.   Fall with contusion to buttock.  Patient will take Tylenol  follow-up with PCP     Final diagnoses:  Fall, initial encounter  Dehydration    ED Discharge Orders     None          Suzette Pac, MD 07/29/24 [phone]

## 2024-07-31 ENCOUNTER — Telehealth: Payer: Self-pay

## 2024-07-31 NOTE — Progress Notes (Signed)
 Complex Care Management Note  Care Guide Note 07/31/2024 Name: CHRISTOFER SHEN MRN: [id number] DOB: 1941/04/13  XIAN ALVES is a 83 y.o. year old male who sees Daryl Setter, NP for primary care. I reached out to [name] by phone today to offer complex care management services.  Mr. Cristobal was given information about Complex Care Management services today including:   The Complex Care Management services include support from the care team which includes your Nurse Care Manager, Clinical Social Worker, or Pharmacist.  The Complex Care Management team is here to help remove barriers to the health concerns and goals most important to you. Complex Care Management services are voluntary, and the patient may decline or stop services at any time by request to their care team member.   Complex Care Management Consent Status: Patient agreed to services and verbal consent obtained.   Follow up plan:  Telephone appointment with complex care management team member scheduled for:  08/04/24 at 9:00 a.m.   Encounter Outcome:  Patient Scheduled  Dreama [facility]  [facility], [facility] VBCI Assistant Direct Dial: [phone]  Fax: [phone]

## 2024-08-04 ENCOUNTER — Other Ambulatory Visit

## 2024-08-05 ENCOUNTER — Other Ambulatory Visit (HOSPITAL_BASED_OUTPATIENT_CLINIC_OR_DEPARTMENT_OTHER): Payer: Self-pay

## 2024-08-05 ENCOUNTER — Other Ambulatory Visit: Payer: Self-pay

## 2024-08-05 ENCOUNTER — Other Ambulatory Visit: Payer: Self-pay | Admitting: Pharmacist

## 2024-08-05 DIAGNOSIS — K219 Gastro-esophageal reflux disease without esophagitis: Secondary | ICD-10-CM

## 2024-08-05 MED ORDER — ATORVASTATIN CALCIUM 10 MG PO TABS
10.0000 mg | ORAL_TABLET | Freq: Every morning | ORAL | 0 refills | Status: DC
  Filled 2024-08-05: qty 90, 90d supply, fill #0
  Filled 2024-08-19: qty 30, 30d supply, fill #0

## 2024-08-05 MED ORDER — OMEPRAZOLE 20 MG PO CPDR
20.0000 mg | DELAYED_RELEASE_CAPSULE | Freq: Every morning | ORAL | 0 refills | Status: DC
  Filled 2024-08-05: qty 90, 90d supply, fill #0
  Filled 2024-08-19: qty 30, 30d supply, fill #0

## 2024-08-05 MED ORDER — LORATADINE 10 MG PO TABS
10.0000 mg | ORAL_TABLET | Freq: Every morning | ORAL | 0 refills | Status: AC
  Filled 2024-08-05: qty 100, 100d supply, fill #0

## 2024-08-05 NOTE — Progress Notes (Signed)
 08/05/2024 Name: [name] Pratt MRN: [id number] DOB: Aug 30, 1941  Chief Complaint  Patient presents with   Medication Management   Medication Adherence    [name] Pratt is a 83 y.o. year old male. Completed phone visit with his son [name] Pratt today.    They were referred to the pharmacist by their PCP for assistance in managing complex medication management.    Subjective:  Care Team: Primary Care Provider: Daryl Setter, NP ; Next Scheduled Visit: 08/26/2024  Medication Access/Adherence  Current Pharmacy:  [id number] - [place], [place] - [address] [place] [address] Phone: [phone] Fax: [phone]  [facility] - [place], [place] - [address] [place] [address] Phone: [phone] Fax: [phone]  [facility] [place] [facility] [address] [place] [address] Phone: [phone] Fax: [phone]   Patient reports affordability concerns with their medications: No  - but he has been using samples of gemtesa and cost would be $300+ if he gets thru [facility] due to $250 deductible and Louanne is tier 4.  Patient reports access/transportation concerns to their pharmacy: No  Patient reports adherence concerns with their medications:  Yes  - son helps with medication administration   Hypertension:  Current medications: losartan  25mg  daily   Hydrochlorothiazide  25mg  if on his med list but patient has not been taking per his son in several months - he did not realize patient was still supposed to take hydrochlorothiazide . He has not taken hydrochlorothiazide  in about 2 months. Last blood pressure in office was elevated but patient also has had falls and dizziness due to low blood pressure.   BP Readings from Last 3 Encounters:  07/27/24 (!) 183/83  07/22/24 (!) 158/87  06/04/24 118/60     Medications  previously tried: amlodpine - stopped due to lwo BP  Son thinks that patient might have a validated, automated, upper arm home BP cuff but they have not been using at home  Current blood pressure readings readings: n/a  Patient' son  denies hypotensive s/sx  recently but he has had dizziness and falls possibly related to low blood pressure in the past  Patient's son denies hypertensive symptoms including no headache, chest pain, shortness of breath    Medication Management:  Current adherence strategy: son helps with filling weekly medication container.  Has been giving levothyroxine  first thing in the morning and all other meds with breakfast.   Son reports Fair adherence to medications  Identified barriers to adherence:  Multiple comorbidities Complex medication regimen Poor knowledge medication.  Current regimen per son:  Upon arising: levothyrxine 150mcg - last refilled #30 days on 07/22/2024  With Breakfast:  Allopurinol  100mg  - takes 2 tablet - last refill for #60 = 30 days on 07/22/2024 Atorvastatin  10mg  - take 1 tablet each morning - last refilled for 90 days on 06/03/2024 Loratidine 10mg  - take 1 tablet daily - buys over-the-counter Losartan  25mg  - take 1 tablet daily - last refill #30 days on 07/03/2024 - due to refill now Omeprazole  20mg  - take 1 capsule daily - last refill #90 days on 06/03/2024  Gemtesa 75mg  - take 1 tablet daily - currently has samples for urology but son is not sure that it is helping very much. He would like to complete samples he has and then stop to see if there is a difference in his urinary symptoms.  Cost of Gemtesa would be about $350 for the first fill due to deductible and [place]anne is tier 4; Myrbetriq  is tier 3 but cost would be around $250 + $47 or $297 for first fill.     Objective:  Lab Results  Component Value Date   HGBA1C 5.6 01/22/2023    Lab Results  Component Value Date   CREATININE 1.08 07/27/2024   BUN 19 07/27/2024    NA 144 07/27/2024   K 3.9 07/27/2024   CL 109 07/27/2024   CO2 25 07/27/2024    Lab Results  Component Value Date   CHOL 122 06/04/2024   HDL 49.80 06/04/2024   LDLCALC 52 06/04/2024   TRIG 101.0 06/04/2024   CHOLHDL 2 06/04/2024    Medications Reviewed Today     Reviewed by Carla Milling, RPH-CPP (Pharmacist) on 08/05/24 at 0905  Med List Status: <None>   Medication Order Taking? Sig Documenting Provider Last Dose Status Informant  acetaminophen  (TYLENOL ) 500 MG tablet [id number] Yes Take 500 mg by mouth at bedtime as needed for moderate pain (pain score 4-6). [provider]  Active Self, Pharmacy Records  albuterol  (VENTOLIN  HFA) 108 [phone] Base) MCG/ACT inhaler [id number] Yes Inhale 2 puffs into the lungs every 6 (six) hours as needed for wheezing or shortness of breath. Daryl Setter, NP  Active   allopurinol  (ZYLOPRIM ) 100 MG tablet [id number] Yes Take 1 tablet (100 mg total) by mouth 2 (two) times daily.  Patient taking differently: Take 200 mg by mouth daily. Each morning   Daryl Setter, NP  Active   antiseptic oral rinse KEENAN) LIQD [id number] Yes 15 mLs by Mouth Rinse route 2 (two) times daily. [provider]  Active Self, Pharmacy Records  atorvastatin  (LIPITOR) 10 MG tablet [id number] Yes Take 1 tablet (10 mg total) by mouth daily.  Patient taking differently: Take 10 mg by mouth in the morning.   O'Sullivan, Melissa, NP  Active   benzonatate  (TESSALON ) 200 MG capsule [id number] Yes Take 1 capsule (200 mg total) by mouth 3 (three) times daily as needed for cough. Daryl Setter, NP  Active   diclofenac  Sodium (VOLTAREN ) 1 % GEL [id number] Yes Apply topically. [provider]  Active   docusate sodium  (COLACE) 100 MG capsule [id number] Yes Take 1 capsule (100 mg total) by mouth every 12 (twelve) hours.  Patient taking differently: Take 100 mg by mouth 2 (two) times daily as needed for mild constipation.   Mannie Fairy DASEN, DO   Active    Patient not taking:   Discontinued 08/05/24 0840 (Patient has not taken in last 30 days)  Patient not taking:   Discontinued 08/05/24 0841 (Patient Preference) hydrochlorothiazide  (HYDRODIURIL ) 25 MG tablet [id number]  Take 1 tablet (25 mg total) by mouth daily.  Patient not taking: Reported on 08/05/2024   Daryl Setter, NP  Active     Discontinued 08/05/24 0848 (Patient has not taken in last 30 days) levothyroxine  (SYNTHROID ) 150 MCG tablet [id number] Yes Take 1 tablet (150 mcg total) by mouth daily. Daryl Setter, NP  Active   loratadine  (CLARITIN ) 10 MG tablet [id number] Yes Take 10 mg by mouth daily as needed for allergies or rhinitis.  Patient taking differently: Take 10 mg by mouth every morning.   [provider]  Active Self, Pharmacy Records    Discontinued 08/05/24 0850 (Duplicate)      Discontinued 08/05/24 0850 (Duplicate)   losartan  (COZAAR ) 25 MG tablet [id number] Yes Take 1 tablet (25 mg total) by mouth daily.  Active   Magnesium  500 MG TABS [id number]  Take 500 mg by mouth daily.  Patient not taking: Reported on 08/05/2024   [provider]  Active Self, Pharmacy Records  Menthol , Topical Analgesic, (ICY HOT EX) [id number] Yes Apply 1 Application topically at bedtime. Lidocaine  / on back [provider]  Active Self, Pharmacy Records  methocarbamol  (ROBAXIN ) 500 MG tablet [id number]  Take 1 tablet (500 mg total) by mouth every 8 (eight) hours as needed for muscle spasms.  Patient not taking: Reported on 08/05/2024   Daryl Setter, NP  Active     Discontinued 08/05/24 [phone] (Patient has not taken in last 30 days) multivitamin-lutein  (OCUVITE-LUTEIN ) CAPS capsule [id number]  Take 1 capsule by mouth daily.  Patient not taking: Reported on 08/05/2024   [provider]  Active Self, Pharmacy Records    Discontinued 08/05/24 [phone] (Completed Course)   nitroGLYCERIN  (NITROSTAT ) 0.4 MG SL tablet [id number] Yes Place 1 tablet (0.4 mg  total) under the tongue every 5 (five) minutes as needed for chest pain. O'Sullivan, Melissa, NP  Active   omeprazole  (PRILOSEC) 20 MG capsule [id number] Yes Take 1 capsule (20 mg total) by mouth in the morning. Daryl Setter, NP  Active   ondansetron  (ZOFRAN ) 4 MG tablet [id number] Yes Take 1 tablet (4 mg total) by mouth every 8 (eight) hours as needed for nausea or vomiting. Daryl Setter, NP  Active   OVER THE COUNTER MEDICATION [id number] Yes Place 1 spray into both nostrils 2 (two) times daily. Sinus saline  Patient taking differently: Place 1 spray into both nostrils 2 (two) times daily as needed. Sinus saline   [provider]  Active Self, Pharmacy Records  polyethylene glycol powder (GLYCOLAX /MIRALAX ) 17 GM/SCOOP powder [id number] Yes Take 17 g by mouth 2 (two) times daily as needed for mild constipation, moderate constipation or severe constipation. Desiderio Chew, PA-C  Active Self, Pharmacy Records  polyvinyl alcohol  (ARTIFICIAL TEARS) 1.4 % ophthalmic solution [id number] Yes Place 1 drop into both eyes daily as needed for dry eyes. [provider]  Active Self, Pharmacy Records  Vibegron [facility]) 75 MG TABS [id number] Yes Take 75 mg by mouth daily. [provider]  Active    Patient not taking:   Discontinued 08/05/24 0905 (Discontinued by provider)          Med Note>> Carla Milling, RPH-CPP   08/05/2024  9:05 AM told to stop by provider in November 2025                Assessment/Plan:   Hypertension: - Currently uncontrolled - Reviewed long term cardiovascular and renal outcomes of uncontrolled blood pressure - Check blood pressure at home if about to locate blood pressure cuff.  - Recommend to continue losartan  25mg  daily - [facility] to get him enough losartan  until adherence packaging starts - Will check with PCP about hydrochlorothiazide  - I would lean toward holding off given his incontinence issues and  increase losartan  if additional blood pressure lowering needed.    Medication Management: - Currently strategy insufficient to maintain appropriate adherence to prescribed medication regimen -  Continue weekly pill box to organize medications - Reviewed med list and removed medications patient is no longer taking - Discussed collaboration with local pharmacies for adherence packaging. Reviewed local pharmacies with adherence packaging options. Son elects to start adherence packaging. I am hoping that he could start packaging around 08/18/2024  Suggested packaging schedule:  6am pack Levothyroxine  150mcg  8am pack Allopurinol  100mg  -  2 tabs Atorvastatin  10mg  Loratadine  10mg  Losartan  25mg  Omeprazole  20mg     Follow Up Plan: 2 to 3 weeks  Madelin Ray, PharmD Clinical Pharmacist [facility]  Population Health [phone]

## 2024-08-19 ENCOUNTER — Other Ambulatory Visit (HOSPITAL_COMMUNITY): Payer: Self-pay

## 2024-08-19 ENCOUNTER — Other Ambulatory Visit: Payer: Self-pay | Admitting: Family

## 2024-08-19 ENCOUNTER — Other Ambulatory Visit: Payer: Self-pay | Admitting: Pharmacist

## 2024-08-19 ENCOUNTER — Other Ambulatory Visit: Payer: Self-pay

## 2024-08-19 ENCOUNTER — Other Ambulatory Visit (HOSPITAL_BASED_OUTPATIENT_CLINIC_OR_DEPARTMENT_OTHER): Payer: Self-pay

## 2024-08-19 DIAGNOSIS — I10 Essential (primary) hypertension: Secondary | ICD-10-CM

## 2024-08-19 MED ORDER — HYDROCHLOROTHIAZIDE 25 MG PO TABS
25.0000 mg | ORAL_TABLET | Freq: Every day | ORAL | 0 refills | Status: DC
  Filled 2024-08-19: qty 30, 30d supply, fill #0

## 2024-08-19 NOTE — Progress Notes (Signed)
 08/19/2024 Name: [name] Pratt MRN: [id number] DOB: [birth year]  Chief Complaint  Patient presents with   Medication Adherence    [name] Pratt is a 83 y.o. year old male. Completed phone visit with his son [name] today.    They were referred to the pharmacist by their PCP for assistance in managing complex medication management.    Subjective:  Care Team: Primary Care Provider: Daryl Setter, NP ; Next Scheduled Visit: 08/26/2024  Medication Access/Adherence  Current Pharmacy:  [id number] - [place], [place] - [address] [place] [address] Phone: [phone] Fax: [phone]  [facility] - [place], [place] - [address] [place] [address] Phone: [phone] Fax: [phone]  [facility] [place] [facility] [address] [place] [address] Phone: [phone] Fax: [phone]   Patient reports affordability concerns with their medications: No  - but he has been using samples of gemtesa and cost would be $300+ if he gets thru [facility] due to $250 deductible and Louanne is tier 4.  Patient reports access/transportation concerns to their pharmacy: No  Patient reports adherence concerns with their medications:  Yes  - son helps with medication administration   Hypertension:  Current medications: losartan  25mg  daily   Hydrochlorothiazide  25mg  is on his med list but patient has not been taking per his son in several months - he did not realize patient was still supposed to take hydrochlorothiazide . He has not taken hydrochlorothiazide  in about 2 months. Last blood pressure in office was elevated but patient also has had falls and dizziness due to low blood pressure. PCP did send in Rx for hydrochlorothiazide  today.   BP Readings from Last 3 Encounters:  07/27/24 (!) 183/83  07/22/24 (!) 158/87  06/04/24 118/60     Medications previously tried: amlodpine - stopped due to lwo BP  Son thinks that patient might have a validated, automated, upper arm home BP cuff but they have not been using at home  Current blood pressure readings readings: n/a  Patient' son  denies hypotensive s/sx  recently but he has had dizziness and falls possibly related to low blood pressure in the past  Patient's son denies hypertensive symptoms including no headache, chest pain, shortness of breath    Medication Management:  Current adherence strategy: son helps with filling weekly medication container.  Has been giving levothyroxine  first thing in the morning and all other meds with breakfast.   Son reports Fair adherence to medications  Identified barriers to adherence:  Multiple comorbidities Complex medication regimen Poor knowledge medication.  Current regimen per son:  Upon arising: levothyrxine 150mcg - last refilled #30 days on 07/22/2024  With Breakfast:  Allopurinol  100mg  - takes 2 tablet - last refill for #60 = 30 days on 07/22/2024 Atorvastatin  10mg  - take 1 tablet each morning - last refilled for 90 days on 06/03/2024 Losartan  25mg  - take 1 tablet daily - last refill #30 days on 07/03/2024 - due to refill now Omeprazole  20mg  - take 1 capsule daily - last refill #90 days on 06/03/2024  Gemtesa 75mg  - take 1 tablet daily - currently has samples for urology but son is not sure that it is helping very much. He would like to complete samples he has and then stop to see if there is a difference in his urinary symptoms.   Cost of Louanne would be about $350  for the first fill due to deductible and Louanne is tier 4; Myrbetriq  is tier 3 but cost would be around $250 + $47 or $297 for first fill.     Objective:  Lab Results  Component Value Date   HGBA1C 5.6 01/22/2023    Lab Results  Component Value Date   CREATININE 1.08 07/27/2024   BUN 19 07/27/2024   NA 144 07/27/2024   K 3.9 07/27/2024    CL 109 07/27/2024   CO2 25 07/27/2024    Lab Results  Component Value Date   CHOL 122 06/04/2024   HDL 49.80 06/04/2024   LDLCALC 52 06/04/2024   TRIG 101.0 06/04/2024   CHOLHDL 2 06/04/2024    Medications Reviewed Today   Medications were not reviewed in this encounter       Assessment/Plan:   Hypertension: - Currently uncontrolled - Reviewed long term cardiovascular and renal outcomes of uncontrolled blood pressure - Check blood pressure at home if about to locate blood pressure cuff.  - Recommend to continue losartan  25mg  daily. PCP office has sent in updated Rx for hydrochlorothiazide  25mg  once daily.  - Due to follow up with PCP next week to have blood pressure checked   Medication Management: - Currently strategy insufficient to maintain appropriate adherence to prescribed medication regimen - Continue weekly pill box to organize medications until first delivery of adherence packs are received - start date is Sunday 08/24/2024.  - Coordinated with medication optimization team to fill and deliver patient's first adherence packs.   Suggested packaging schedule:  6am pack Levothyroxine  150mcg  8am pack Allopurinol  100mg  - 2 tabs Hydrochlorothiazide  25mg   Atorvastatin  10mg  Losartan  25mg  Omeprazole  20mg   They have bottle of loratadine  and will not include in packs at this time.    Follow Up Plan: 2 to 3 weeks to see how adherence packs are working out and coordinate reorder  Madelin Ray, PharmD Clinical Pharmacist [facility]  Population Health [phone]

## 2024-08-20 ENCOUNTER — Other Ambulatory Visit (HOSPITAL_BASED_OUTPATIENT_CLINIC_OR_DEPARTMENT_OTHER): Payer: Self-pay

## 2024-08-21 ENCOUNTER — Other Ambulatory Visit (HOSPITAL_BASED_OUTPATIENT_CLINIC_OR_DEPARTMENT_OTHER): Payer: Self-pay

## 2024-08-22 ENCOUNTER — Other Ambulatory Visit (HOSPITAL_BASED_OUTPATIENT_CLINIC_OR_DEPARTMENT_OTHER): Payer: Self-pay

## 2024-08-22 ENCOUNTER — Other Ambulatory Visit (HOSPITAL_COMMUNITY): Payer: Self-pay

## 2024-08-22 ENCOUNTER — Other Ambulatory Visit: Payer: Self-pay

## 2024-08-22 ENCOUNTER — Telehealth: Payer: Self-pay | Admitting: Pharmacist

## 2024-08-22 DIAGNOSIS — M1A0720 Idiopathic chronic gout, left ankle and foot, without tophus (tophi): Secondary | ICD-10-CM

## 2024-08-22 DIAGNOSIS — K219 Gastro-esophageal reflux disease without esophagitis: Secondary | ICD-10-CM

## 2024-08-22 DIAGNOSIS — I10 Essential (primary) hypertension: Secondary | ICD-10-CM

## 2024-08-22 MED ORDER — ATORVASTATIN CALCIUM 10 MG PO TABS
10.0000 mg | ORAL_TABLET | Freq: Every day | ORAL | 0 refills | Status: AC
  Filled 2024-08-22 (×2): qty 30, 30d supply, fill #0
  Filled 2024-09-16: qty 30, 30d supply, fill #1

## 2024-08-22 MED ORDER — ALLOPURINOL 100 MG PO TABS
200.0000 mg | ORAL_TABLET | Freq: Every day | ORAL | 2 refills | Status: AC
  Filled 2024-08-22 (×2): qty 60, 30d supply, fill #0
  Filled 2024-09-16: qty 60, 30d supply, fill #1

## 2024-08-22 MED ORDER — HYDROCHLOROTHIAZIDE 25 MG PO TABS
25.0000 mg | ORAL_TABLET | Freq: Every day | ORAL | 0 refills | Status: AC
  Filled 2024-08-22 (×2): qty 30, 30d supply, fill #0
  Filled 2024-09-16: qty 30, 30d supply, fill #1

## 2024-08-22 MED ORDER — LOSARTAN POTASSIUM 25 MG PO TABS
25.0000 mg | ORAL_TABLET | Freq: Every day | ORAL | 0 refills | Status: AC
  Filled 2024-08-22 (×2): qty 30, 30d supply, fill #0
  Filled 2024-09-16: qty 30, 30d supply, fill #1

## 2024-08-22 MED ORDER — OMEPRAZOLE 20 MG PO CPDR
20.0000 mg | DELAYED_RELEASE_CAPSULE | Freq: Every day | ORAL | 0 refills | Status: AC
  Filled 2024-08-22 (×2): qty 30, 30d supply, fill #0
  Filled 2024-09-16: qty 30, 30d supply, fill #1

## 2024-08-22 NOTE — Telephone Encounter (Signed)
 Received request from pharmacy to change the following to take 1 tablet at noon so the packaging system will put allopurinol , hydrochlorothiazide , losartan , atorvastatin  and omeprazole  in one pack together.

## 2024-08-22 NOTE — Telephone Encounter (Signed)
 Mr Quinlivan's son had presented to the [facility] pharmacy to pick up his dad's adherence packs (first time filling in packs) last night be the packs were not like he was expecting. The levothyrxine was not included and allopurinol  was twice a day (per Rx) instead of 2 tabs once a day which is how patient has been taking.  Coordinated with medication optimization team and son. Packs will be redone according to this schedule.   Morning Pack (patient will take when he gets up) levothyroxine   Noon Pack (patient will actually taking around 8am)  Allopurinol  100mg  - take 2 tablets Hydrochlorothiazide  25mg  Atorvastatin  10mg  Losartan  25mg   Omeprazole  20mg    Patient's son can pick up packs after 4:30pm today.

## 2024-08-22 NOTE — Addendum Note (Signed)
 Addended by: CARLA MILLING B on: 08/22/2024 11:49 AM   Modules accepted: Orders

## 2024-08-25 ENCOUNTER — Other Ambulatory Visit: Payer: Self-pay

## 2024-08-26 ENCOUNTER — Ambulatory Visit: Admitting: Family

## 2024-08-26 ENCOUNTER — Encounter: Payer: Self-pay | Admitting: Family

## 2024-08-26 VITALS — BP 154/73 | HR 66 | Resp 14 | Ht 66.0 in | Wt 130.0 lb

## 2024-08-26 DIAGNOSIS — M545 Low back pain, unspecified: Secondary | ICD-10-CM

## 2024-08-26 DIAGNOSIS — R296 Repeated falls: Secondary | ICD-10-CM

## 2024-08-26 DIAGNOSIS — Z111 Encounter for screening for respiratory tuberculosis: Secondary | ICD-10-CM | POA: Diagnosis not present

## 2024-08-26 DIAGNOSIS — I10 Essential (primary) hypertension: Secondary | ICD-10-CM

## 2024-08-26 DIAGNOSIS — E039 Hypothyroidism, unspecified: Secondary | ICD-10-CM | POA: Diagnosis not present

## 2024-08-26 DIAGNOSIS — G8929 Other chronic pain: Secondary | ICD-10-CM | POA: Diagnosis not present

## 2024-08-26 DIAGNOSIS — R32 Unspecified urinary incontinence: Secondary | ICD-10-CM

## 2024-08-26 NOTE — Progress Notes (Unsigned)
 Subjective:     Patient ID: [name] Pratt, male    DOB: [birth year], 83 y.o.   MRN: [id number]  Chief Complaint  Patient presents with   Follow-up    Patient is here for 6 week f/u    HPI  Discussed the use of AI scribe software for clinical note transcription with the patient, who gave verbal consent to proceed.  History of Present Illness BP Readings from Last 3 Encounters:  08/26/24 (!) 154/73  07/27/24 (!) 183/83  07/22/24 (!) 158/87   Lab Results  Component Value Date   TSH 14.36 (H) 06/04/2024   Lab Results  Component Value Date   CHOL 122 06/04/2024   HDL 49.80 06/04/2024   LDLCALC 52 06/04/2024   TRIG 101.0 06/04/2024   CHOLHDL 2 06/04/2024      [name] Pratt is an 83 year old male who presents for a six-week follow-up and form completion for assisted living placement. He is accompanied by his son.  He has ongoing urinary issues, using a condom catheter at night due to incontinence. He experiences difficulty urinating, with a sensation of pressure and occasional difficulty locating the catheter. No burning during urination. He consumes a lot of fluids but not much water.  He has experienced constipation over the past four days, with a recent bowel movement that included a small amount of blood. He has been given medication for constipation and has had a bowel movement since.  He has had multiple falls recently, including one incident where he fell in the garage. He describes weakness, particularly on his right side, and difficulty standing after lying down for extended periods. He has been using a wheelchair for safety. His son notes that he requires assistance to stand from a bed or couch.  He has a history of back pain and has received injections in the past, which provided temporary relief. He describes a knot in his lower back and discomfort when lying down. He uses pillows for support to alleviate pressure.  His son is managing his  transition to assisted living, where he will receive assistance with daily activities, including bathing and mobility. The facility will provide therapy, and his son holds healthcare power of attorney.      Health Maintenance Due  Topic Date Due   Medicare Annual Wellness (AWV)  01/20/2023   COVID-19 Vaccine (6 - 2025-26 season) 05/12/2024    Past Medical History:  Diagnosis Date   Acute kidney injury 05/02/2022   Acute metabolic encephalopathy 04/24/2022   Acute respiratory failure with hypoxia (HCC) 09/14/2023   Adenomatous polyp of ascending colon    Adenomatous polyp of colon    Allergic rhinitis 02/02/2016   Anemia 12/17/2009   Formatting of this note might be different from the original. 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 Hypertension   Benign paroxysmal positional vertigo 09/14/2021   Bilateral sacroiliitis 07/22/2021   Body mass index (BMI) 25.0-25.9, adult 11/02/2020   Cancer (HCC)    skin cancer   Cardiac murmur 07/25/2021   Carotid stenosis    a. Carotid U/S 5/13: LICA < 50%, RICA 50-69%;  b.  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   Cystoid macular edema of both eyes  07/28/2020   Degenerative disc disease, lumbar 06/30/2015   Deviated nasal septum 02/18/2015   Diverticulosis 03/03/2020   DJD (degenerative joint disease) of hip 12/24/2012   Drowsiness 12/07/2022   Dry eyes 09/14/2023   Dyspnea 06/12/2011   Emphysema, unspecified (HCC) 03/05/2020   Erectile dysfunction 06/20/2017   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   Fall 09/18/2022   Gait disorder 03/02/2021   Gait instability 03/02/2021   GERD (gastroesophageal reflux disease)    GI bleed 04/21/2022   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   Qualifier: Diagnosis of  By: Gwenn Grimes     Greater trochanteric pain syndrome 07/22/2021   Hand pain, left 01/16/2022   Hand weakness 05/02/2022   Hearing loss 02/02/2016   Hematochezia    Heme positive stool 02/29/2020   Hemorrhagic shock (HCC)    HIATAL HERNIA 05/13/2009   Qualifier: Diagnosis of   By: Gwenn Grimes      Replacing diagnoses that were inactivated after the 12/11/22 regulatory import     High risk medication use 01/18/2012   Hip pain 03/02/2021   History of chicken pox    History of COVID-19 12/05/2021   History of hemorrhoids    History of kidney stones    History of skin cancer 07/22/2021   skin cancer   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  Cardiology - Dr Orlean at [facility] cardiology     ICD-10 cut over     Hyperlipidemia with target LDL less than 130 12/27/2010   Formatting of this note might be different from the original. Cardiology - Dr Orlean at [facility] cardiology  ICD-10 cut over   Hypokalemia 07/08/2021   Hypothyroidism    Internal hemorrhoids 03/03/2020   Iron  deficiency anemia 09/18/2022   Irregular heart beat 02/03/2022   Leg cramps 11/13/2019   Lower GI bleed 12/12/2012    Lumbago of lumbar region with sciatica 10/21/2020   Lumbar radiculopathy 03/02/2021   Lumbar spondylosis 11/02/2020   NEPHROLITHIASIS 05/13/2009   Qualifier: Diagnosis of  By: Gwenn Grimes     Nonspecific abnormal electrocardiogram (ECG) (EKG) 01/06/2013   Numbness of hand 01/24/2022   Onychomycosis 06/30/2015   OSA (obstructive sleep apnea) 11/25/2012   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 Osteoarthritis Of The Hand  10/1 IMO update   Osteoarthrosis, unspecified whether generalized or localized, pelvic region and thigh 07/25/2010   Formatting of this note might be different from the original. Osteoarthritis Of The Hip   Other abnormal glucose 07/22/2021   Other allergic rhinitis 02/18/2015   Palpitations 07/25/2021  Peripheral neuropathy 06/19/2016   Pneumonia of both lower lobes due to infectious organism 09/27/2023   Preop examination 12/06/2022   Preventative health care 11/24/2015   Right groin pain 11/29/2012   Right inguinal hernia 05/13/2009   Qualifier: Diagnosis of  By: Gwenn Grimes     RLS (restless legs syndrome) 02/18/2015   Rosacea    Rotator cuff tear 07/27/2013   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   Sinus bradycardia 09/14/2023   Situational depression 03/02/2021   Skin lesion 01/16/2022   Skin tear of forearm without complication, left, subsequent encounter 09/18/2022   Sleep apnea with use of continuous positive airway pressure (CPAP) 11/25/2012   CPAP set to  7 cm water,  Residual AHi was 7.8  With no leak.  User time 6 hours.  479 days , not used only 12 days - highly compliant 06-23-13  .    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   Urinary retention 11/26/2020   Vasovagal syncope    Ventral hernia 07/08/2012   Vertigo 07/08/2021   Vitamin D  deficiency 08/07/2022   Weakness of lower extremity 10/10/2022   Weight loss 03/03/2020    Past Surgical History:  Procedure Laterality Date   ABDOMINAL HERNIA REPAIR  07/15/12   Dr Kingston   ANTERIOR CERVICAL DECOMP/DISCECTOMY FUSION N/A 07/09/2020   Procedure: ANTERIOR CERVICAL DECOMPRESSION AND FUSION CERVICAL THREE-FOUR.;  Surgeon: Onetha Arley, MD;  Location: [facility] OR;  Service: Neurosurgery;  Laterality: N/A;  anterior   BROW LIFT  05/07/01   COLONOSCOPY WITH PROPOFOL  N/A 04/24/2022   Procedure: COLONOSCOPY WITH PROPOFOL ;  Surgeon: Eda Iha, MD;  Location: [facility] ENDOSCOPY;  Service: Gastroenterology;  Laterality: N/A;   COLONOSCOPY WITH PROPOFOL  N/A 04/26/2022   Procedure: COLONOSCOPY WITH PROPOFOL ;  Surgeon: Eda Iha, MD;  Location: [facility] ENDOSCOPY;  Service: Gastroenterology;  Laterality: N/A;   CYSTOSCOPY WITH URETEROSCOPY AND STENT PLACEMENT Right 01/28/2014   Procedure: CYSTOSCOPY WITH URETEROSCOPY, BASKET RETRIVAL AND  STENT PLACEMENT;  Surgeon: Alm GORMAN Fragmin, MD;  Location: [facility] ORS;  Service: Urology;  Laterality: Right;   epidural injections     multiple procedures   ESOPHAGEAL DILATION  1993 and 1994   multiple times   EYE SURGERY Bilateral 03/25/10, 2012   cataract removal   HEMORRHOID SURGERY  2014   HIATAL HERNIA REPAIR  12/21/93   HOLMIUM LASER APPLICATION Right 01/28/2014   Procedure: HOLMIUM LASER APPLICATION;  Surgeon: Alm GORMAN Fragmin, MD;  Location: [facility] ORS;  Service: Urology;  Laterality: Right;   INGUINAL HERNIA REPAIR Right 07/15/12   Dr Kingston, x2   JOINT REPLACEMENT  07/25/04   right knee   JOINT REPLACEMENT  07/13/10   left knee   KNEE SURGERY  1995    LAPAROSCOPIC CHOLECYSTECTOMY  09/20/06   with intraoperative cholangiogram and right inguinal herniorrhaphy with mesh    LIGAMENT REPAIR Left 07/2013   shoulder   LITHOTRIPSY     x2   LUMBAR LAMINECTOMY/DECOMPRESSION MICRODISCECTOMY Bilateral 12/27/2022   Procedure: Lumbar Two-Three, Lumbar Three-Four Sublaminar Decompression;  Surgeon: Onetha Arley, MD;  Location: [facility] OR;  Service: Neurosurgery;  Laterality: Bilateral;   NASAL SEPTOPLASTY  W/ TURBINOPLASTY Bilateral 03/31/2024   Procedure: SEPTOPLASTY, NOSE, WITH NASAL TURBINATE REDUCTION;  Surgeon: Karis Clunes, MD;  Location: [facility];  Service: ENT;  Laterality: Bilateral;   NASAL SEPTUM SURGERY  1975   POLYPECTOMY  04/26/2022   Procedure: POLYPECTOMY;  Surgeon: Eda Iha, MD;  Location: [facility] ENDOSCOPY;  Service: Gastroenterology;;   POSTERIOR CERVICAL FUSION/FORAMINOTOMY N/A 11/24/2020   Procedure: Posterior Cervical Laminectomy Cervical three-four, Cervical four-five with lateral mass fusion;  Surgeon: Onetha Kuba, MD;  Location: [facility] OR;  Service: Neurosurgery;  Laterality: N/A;   REFRACTIVE SURGERY Bilateral    Right total hip replacement  4/14   SKIN CANCER DESTRUCTION     nose, ear   TONSILLECTOMY  as child   TOTAL HIP ARTHROPLASTY Left    URETHRAL DILATION  1991   Dr. 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   2 grandchildren   Has worked for warden/ranger   Enjoys wood working   Completed HS.  [employer]   Social Drivers of Health   Tobacco Use: Medium Risk (08/26/2024)   Patient History    Smoking Tobacco Use: Former    Smokeless Tobacco Use: Never    Passive Exposure: Not on file  Financial Resource Strain: Low Risk (10/28/2021)   Overall Financial Resource Strain (CARDIA)    Difficulty of Paying Living Expenses: Not very hard  Food Insecurity: Low Risk (04/23/2024)   Received from [facility]   Epic    Within the past 12 months, you worried that your food would run out before you got money to buy more: Never true    Within the past 12 months, the food you bought just didn't last and you didn't have money to get more. : Never true  Transportation Needs: No Transportation Needs (04/23/2024)   Received from [facility]    In the past 12 months, has lack of reliable transportation kept you from medical appointments, meetings, work or from getting things needed for daily living? : No  Physical Activity: Inactive (03/03/2024)   Exercise Vital Sign    Days of Exercise per Week: 0 days    Minutes of Exercise per Session: 0 min  Stress: Stress Concern Present (03/03/2024)   [employer] of Occupational Health -  Occupational Stress Questionnaire    Feeling of Stress: To some extent  Social Connections: Moderately Isolated (09/14/2023)   Social Connection and Isolation Panel    Frequency of Communication with Friends and Family: Three times a week    Frequency of Social Gatherings with Friends and Family: Three times a week    Attends  Religious Services: Never    Active Member of Clubs or Organizations: No    Attends Banker Meetings: Never    Marital Status: Married  Catering Manager Violence: Not At Risk (03/03/2024)   Epic    Fear of Current or Ex-Partner: No    Emotionally Abused: No    Physically Abused: No    Sexually Abused: No  Depression (PHQ2-9): Medium Risk (04/01/2024)   Depression (PHQ2-9)    PHQ-2 Score: 7  Alcohol  Screen: Low Risk (01/19/2022)   Alcohol  Screen    Last Alcohol  Screening Score (AUDIT): 0  Housing: Low Risk (04/23/2024)   Received from [facility]   Epic    What is your living situation today?: I have a steady place to live    Think about the place you live. Do you have problems with any of the following? Choose all that apply:: None/None on this list  Utilities: Low Risk (04/23/2024)   Received from [facility]   Utilities    In the past 12 months has the electric, gas, oil, or water company threatened to shut off services in your home? : No  Health Literacy: Not on file    Outpatient Medications Prior to Visit  Medication Sig Dispense Refill   acetaminophen  (TYLENOL ) 500 MG tablet Take 500 mg by mouth at bedtime as needed for moderate pain (pain score 4-6).     albuterol  (VENTOLIN  HFA) 108 (90 Base) MCG/ACT inhaler Inhale 2 puffs into the lungs every 6 (six) hours as needed for wheezing or shortness of breath. 6.7 g 1   allopurinol  (ZYLOPRIM ) 100 MG tablet Take 2 tablets (200 mg total) by mouth daily at 12 noon. 60 tablet 2   antiseptic oral rinse (BIOTENE) LIQD 15 mLs by Mouth Rinse route 2 (two) times daily.     atorvastatin  (LIPITOR) 10 MG tablet Take 1 tablet (10 mg total) by mouth daily at 12 noon 90 tablet 0   benzonatate  (TESSALON ) 200 MG capsule Take 1 capsule (200 mg total) by mouth 3 (three) times daily as needed for cough. 20 capsule 0   diclofenac  Sodium (VOLTAREN ) 1 % GEL Apply topically.     docusate sodium  (COLACE) 100 MG capsule Take 1 capsule (100 mg  total) by mouth every 12 (twelve) hours. (Patient taking differently: Take 100 mg by mouth 2 (two) times daily as needed for mild constipation.) 100 capsule 0   hydrochlorothiazide  (HYDRODIURIL ) 25 MG tablet Take 1 tablet (25 mg total) by mouth daily at 12 noon 90 tablet 0   levothyroxine  (SYNTHROID ) 150 MCG tablet Take 1 tablet (150 mcg total) by mouth daily. 30 tablet 3   loratadine  (CLARITIN ) 10 MG tablet Take 1 tablet (10 mg total) by mouth every morning. 100 tablet 0   losartan  (COZAAR ) 25 MG tablet Take 1 tablet (25 mg total) by mouth daily at 12 noon 90 tablet 0   Magnesium  500 MG TABS Take 500 mg by mouth daily.     Menthol , Topical Analgesic, (ICY HOT EX) Apply 1 Application topically at bedtime. Lidocaine  / on back     multivitamin-lutein  (OCUVITE-LUTEIN ) CAPS capsule Take 1 capsule by mouth  daily.     nitroGLYCERIN  (NITROSTAT ) 0.4 MG SL tablet Place 1 tablet (0.4 mg total) under the tongue every 5 (five) minutes as needed for chest pain. 25 tablet 6   omeprazole  (PRILOSEC) 20 MG capsule Take 1 capsule (20 mg total) by mouth daily at 12 noon 90 capsule 0   ondansetron  (ZOFRAN ) 4 MG tablet Take 1 tablet (4 mg total) by mouth every 8 (eight) hours as needed for nausea or vomiting. 30 tablet 0   OVER THE COUNTER MEDICATION Place 1 spray into both nostrils 2 (two) times daily. Sinus saline (Patient taking differently: Place 1 spray into both nostrils 2 (two) times daily as needed. Sinus saline)     polyethylene glycol powder (GLYCOLAX /MIRALAX ) 17 GM/SCOOP powder Take 17 g by mouth 2 (two) times daily as needed for mild constipation, moderate constipation or severe constipation. 255 g 0   polyvinyl alcohol  (ARTIFICIAL TEARS) 1.4 % ophthalmic solution Place 1 drop into both eyes daily as needed for dry eyes.     Vibegron (GEMTESA) 75 MG TABS Take 75 mg by mouth daily.     methocarbamol  (ROBAXIN ) 500 MG tablet Take 1 tablet (500 mg total) by mouth every 8 (eight) hours as needed for muscle spasms.  30 tablet 0   No facility-administered medications prior to visit.    Allergies[1]  ROS See HPI    Objective:    Physical Exam Constitutional:      General: He is not in acute distress.    Appearance: He is well-developed.     Comments: Elderly white male seated in wheelchair  HENT:     Head: Normocephalic and atraumatic.  Cardiovascular:     Rate and Rhythm: Normal rate and regular rhythm.     Heart sounds: No murmur heard. Pulmonary:     Effort: Pulmonary effort is normal. No respiratory distress.     Breath sounds: Normal breath sounds. No wheezing or rales.  Skin:    General: Skin is warm and dry.  Neurological:     Mental Status: He is alert and oriented to person, place, and time.  Psychiatric:        Behavior: Behavior normal.        Thought Content: Thought content normal.      BP (!) 154/73 (BP Location: Left Arm, Patient Position: Sitting, Cuff Size: Normal)   Pulse 66   Resp 14   Ht 5' 6 (1.676 m)   Wt 130 lb (59 kg)   BMI 20.98 kg/m  Wt Readings from Last 3 Encounters:  08/26/24 130 lb (59 kg)  07/27/24 127 lb 13.9 oz (58 kg)  07/22/24 129 lb (58.5 kg)       Assessment & Plan:   Problem List Items Addressed This Visit       Unprioritized   Urinary incontinence    Urinary incontinence with external catheter management Experiences urinary incontinence managed with external catheter. External catheter preferred over indwelling to reduce infection risk. - Continue external catheter management.      Recurrent falls    Recurrent falls due to mobility impairment. Uses wheelchair for safety. Assisted living facility planned for better quality of life and assistance with daily activities. - Continue with plans for assisted living facility admission.       Hypothyroidism   Relevant Orders   TSH   HTN (hypertension)   Relevant Orders   Basic Metabolic Panel (BMET)   Chronic low back pain    Previous injections providing temporary relief.  Reports discomfort when lying on affected area. - Continue current management and monitor for changes in pain.      Other Visit Diagnoses       Screening-pulmonary TB    -  Primary   Relevant Orders   TB Skin Test (Completed)       Assessment & Plan Screening for respiratory tuberculosis Requires TB screening for assisted living facility admission. Options include blood test or PPD skin test. - Placed PPD skin test today. - Scheduled follow-up for PPD reading in 48-72 hours.   We did discuss code status and he requests full code at this time.  I personally spent a total of 40 minutes in the care of the patient today including preparing to see the patient, placing orders, documenting clinical information in the EHR, coordinating care, and completing paperwork.   I have discontinued Geovani B. Flynt [name]'s methocarbamol . I am also having him maintain his Magnesium , antiseptic oral rinse, artificial tears, acetaminophen , (Menthol , Topical Analgesic, (ICY HOT EX)), OVER THE COUNTER MEDICATION, multivitamin-lutein , polyethylene glycol powder, diclofenac  Sodium, docusate sodium , nitroGLYCERIN , benzonatate , ondansetron , albuterol , Gemtesa, levothyroxine , loratadine , allopurinol , omeprazole , losartan , hydrochlorothiazide , and atorvastatin .  No orders of the defined types were placed in this encounter.     [1]  Allergies Allergen Reactions   Pravastatin  Other (See Comments)    Muscle cramps   Sildenafil  Other (See Comments)    Tachycardia     Oxycodone  Other (See Comments)    Hallucinations    Atenolol Other (See Comments)    Bradycardia    Metoclopramide Hcl Anxiety

## 2024-08-28 DIAGNOSIS — R32 Unspecified urinary incontinence: Secondary | ICD-10-CM | POA: Insufficient documentation

## 2024-08-28 NOTE — Assessment & Plan Note (Signed)
°  Urinary incontinence with external catheter management Experiences urinary incontinence managed with external catheter. External catheter preferred over indwelling to reduce infection risk. - Continue external catheter management.

## 2024-08-28 NOTE — Patient Instructions (Signed)
°  VISIT SUMMARY: Today, you came in for a six-week follow-up and to complete a TB test form for your upcoming move to an assisted living facility. We discussed your ongoing urinary issues, recent constipation, recurrent falls, and chronic low back pain. We also reviewed your general health maintenance, including vaccinations.  YOUR PLAN: -SCREENING FOR RESPIRATORY TUBERCULOSIS: You need a TB screening for your admission to the assisted living facility. We placed a PPD skin test today, and you will need to return in 48-72 hours to have it read.  -URINARY INCONTINENCE WITH EXTERNAL CATHETER MANAGEMENT: You are experiencing urinary incontinence and are using an external catheter at night. This type of catheter is preferred over an indwelling one to reduce the risk of infection. Please continue with your current catheter management.  -CONSTIPATION WITH MINOR RECTAL BLEEDING: You have had constipation recently, with a small amount of blood in your stool likely due to straining. Please monitor your bowel movements and let us  know if the bleeding persists.  -RECURRENT FALLS WITH MOBILITY IMPAIRMENT: You have been experiencing falls due to mobility issues and are using a wheelchair for safety. Your move to an assisted living facility will help you with daily activities and improve your quality of life. Please continue with the plans for your move.  -CHRONIC LOW BACK PAIN: You have chronic low back pain that has been temporarily relieved by injections in the past. Please continue with your current pain management and let us  know if there are any changes in your pain.  -GENERAL HEALTH MAINTENANCE: You received your pneumococcal vaccine in 2014 and your influenza vaccine in September 2024. We discussed the potential need for a pneumococcal vaccine booster if required by the assisted living facility.  INSTRUCTIONS: Please return in 48-72 hours for your PPD skin test reading. Monitor your bowel movements and  report any persistent bleeding. Continue with your current catheter management and pain management. Follow through with your plans to move to the assisted living facility.

## 2024-08-28 NOTE — Assessment & Plan Note (Signed)
°  Recurrent falls due to mobility impairment. Uses wheelchair for safety. Assisted living facility planned for better quality of life and assistance with daily activities. - Continue with plans for assisted living facility admission.

## 2024-08-28 NOTE — Assessment & Plan Note (Signed)
°  Previous injections providing temporary relief. Reports discomfort when lying on affected area. - Continue current management and monitor for changes in pain.

## 2024-08-29 ENCOUNTER — Other Ambulatory Visit

## 2024-08-29 ENCOUNTER — Ambulatory Visit (INDEPENDENT_AMBULATORY_CARE_PROVIDER_SITE_OTHER)

## 2024-08-29 DIAGNOSIS — Z111 Encounter for screening for respiratory tuberculosis: Secondary | ICD-10-CM | POA: Diagnosis not present

## 2024-08-29 DIAGNOSIS — I10 Essential (primary) hypertension: Secondary | ICD-10-CM | POA: Diagnosis not present

## 2024-08-29 DIAGNOSIS — E039 Hypothyroidism, unspecified: Secondary | ICD-10-CM

## 2024-08-29 LAB — BASIC METABOLIC PANEL WITH GFR
BUN: 31 mg/dL — ABNORMAL HIGH (ref 6–23)
CO2: 27 meq/L (ref 19–32)
Calcium: 9.7 mg/dL (ref 8.4–10.5)
Chloride: 102 meq/L (ref 96–112)
Creatinine, Ser: 1.3 mg/dL (ref 0.40–1.50)
GFR: 50.44 mL/min — ABNORMAL LOW
Glucose, Bld: 96 mg/dL (ref 70–99)
Potassium: 4 meq/L (ref 3.5–5.1)
Sodium: 137 meq/L (ref 135–145)

## 2024-08-29 LAB — TB SKIN TEST
Induration: 0 mm
TB Skin Test: NEGATIVE

## 2024-08-29 LAB — TSH: TSH: 0.05 u[IU]/mL — ABNORMAL LOW (ref 0.35–5.50)

## 2024-08-29 NOTE — Progress Notes (Addendum)
 Patient is here for a PPD Skin test reading:  PPD Reading Note PPD read and results entered in EpicCare.  Result: 0 mm induration.  Interpretation: Negative  If test not read within 48-72 hours of initial placement, patient advised to repeat in other arm 1-3 weeks after this test. -No need to repeat  Allergic reaction: No   Results were printed and given to patient 08/29/2024

## 2024-09-01 ENCOUNTER — Other Ambulatory Visit: Payer: Self-pay

## 2024-09-01 ENCOUNTER — Other Ambulatory Visit (HOSPITAL_BASED_OUTPATIENT_CLINIC_OR_DEPARTMENT_OTHER): Payer: Self-pay

## 2024-09-01 ENCOUNTER — Ambulatory Visit: Payer: Self-pay | Admitting: Family

## 2024-09-01 ENCOUNTER — Telehealth: Payer: Self-pay

## 2024-09-01 DIAGNOSIS — E039 Hypothyroidism, unspecified: Secondary | ICD-10-CM

## 2024-09-01 MED ORDER — LEVOTHYROXINE SODIUM 137 MCG PO TABS
137.0000 ug | ORAL_TABLET | Freq: Every day | ORAL | 0 refills | Status: AC
  Filled 2024-09-01: qty 25, 25d supply, fill #0
  Filled 2024-09-01: qty 90, 90d supply, fill #0
  Filled 2024-09-16: qty 30, 30d supply, fill #1

## 2024-09-01 NOTE — Telephone Encounter (Signed)
 Copied from CRM #[id number]. Topic: General - Other >> Sep 01, 2024 11:23 AM Franky GRADE wrote: Reason for CRM: Assisted living facility is calling regarding FL2 form that was received and stated that it is incorrect, all medications need to be hand written on the form in order to be accepted and they also need the results of the TB test to be sent.

## 2024-09-01 NOTE — Telephone Encounter (Signed)
 Form email to patient's son as requested

## 2024-09-01 NOTE — Telephone Encounter (Signed)
 Synthroid  needs to be decreased to 137 mcg. Please send to preferred pharmacy. I will repeat labs when he comes back in February.

## 2024-09-01 NOTE — Telephone Encounter (Signed)
 Rx sent to [facility] pharmacy at patient's request

## 2024-09-03 ENCOUNTER — Other Ambulatory Visit (HOSPITAL_BASED_OUTPATIENT_CLINIC_OR_DEPARTMENT_OTHER): Payer: Self-pay

## 2024-09-09 ENCOUNTER — Other Ambulatory Visit: Payer: Self-pay

## 2024-09-09 ENCOUNTER — Other Ambulatory Visit (INDEPENDENT_AMBULATORY_CARE_PROVIDER_SITE_OTHER): Payer: Self-pay | Admitting: Pharmacist

## 2024-09-09 ENCOUNTER — Other Ambulatory Visit: Payer: Self-pay | Admitting: Licensed Clinical Social Worker

## 2024-09-09 NOTE — Progress Notes (Signed)
 "  09/09/2024 Name: [name] Pratt MRN: [id number] DOB: 02-11-1941  Chief Complaint  Patient presents with   Medication Adherence    [name] Pratt is a 83 y.o. year old male. Completed phone visit with his son [name] today.   They were referred to the pharmacist by their PCP for assistance in managing complex medication management.    Subjective: Patient will likely be transitioning to assisted living facility 09/20/2024. He is currently receiving medications thru [facility] and medications are packaged. Son is not sure if facility will provide medications or if they will provide meds.  His current packages started 08/27/2024 and should last until around 01/15 or 01/16.  His current packs do not include his levothyroxine  dose because dose was changed 09/01/2024 - lowered to levothyroxine  137mcg due to low TSH.   Care Team: Primary Care Provider: Daryl Setter, NP ; Next Scheduled Visit: 10/28/2024  Medication Access/Adherence  Current Pharmacy:  [id number] - [place], [place] - [address] [place] [address] Phone: [phone] Fax: [phone]  [facility] [place], [place] - [address] [facility] [address] [place] [address] Phone: [phone] Fax: [phone]  [facility] [place] [facility] [address] [place] [address] Phone: [phone] Fax: [phone]   Patient reports affordability concerns with their medications: No    Patient reports access/transportation concerns to their pharmacy: No  Patient reports adherence concerns with their medications:  Yes  - son helps with medication administration   Hypertension:  Current medications: losartan  25mg  daily and hydrochlorothiazide  25mg  daily at noone   BP Readings from Last 3 Encounters:  08/26/24 (!) 154/73  07/27/24 (!) 183/83  07/22/24 (!) 158/87    Medications  previously tried: amlodpine - stopped due to low BP  Son thinks that patient might have a validated, automated, upper arm home BP cuff but they have not been using at home  Current blood pressure readings readings: n/a  Patient' son  denies hypotensive s/sx  recently but he has had dizziness and falls possibly related to low blood pressure in the past  Patient's son denies hypertensive symptoms including no headache, chest pain, shortness of breath    Medication Management:  Current adherence strategy: son helps with filling weekly medication container.  Has been giving levothyroxine  first thing in the morning and all other meds with breakfast.   Son reports Fair adherence to medications  Identified barriers to adherence:  Multiple comorbidities Complex medication regimen Poor knowledge medication.  Current regimen per son:   Morning (patient takes when he gets up) Levothyroxine  137mcg  Noon Pack (patient will actually taking around 8am)  Allopurinol  100mg  - take 2 tablets Hydrochlorothiazide  25mg  Atorvastatin  10mg  Losartan  25mg   Omeprazole  20mg    Objective:  Lab Results  Component Value Date   HGBA1C 5.6 01/22/2023    Lab Results  Component Value Date   CREATININE 1.30 08/29/2024   BUN 31 (H) 08/29/2024   NA 137 08/29/2024   K 4.0 08/29/2024   CL 102 08/29/2024   CO2 27 08/29/2024    Lab Results  Component Value Date   CHOL 122 06/04/2024   HDL 49.80 06/04/2024   LDLCALC 52 06/04/2024   TRIG 101.0 06/04/2024   CHOLHDL 2 06/04/2024    Medications Reviewed Today     Reviewed by Carla Milling, RPH-CPP (Pharmacist) on 09/09/24 at [phone]  Med List Status: <None>  Medication Order Taking? Sig Documenting Provider Last Dose Status Informant  acetaminophen  (TYLENOL ) 500 MG tablet [id number] Yes Take 500 mg by mouth at bedtime as needed for moderate pain (pain score 4-6). [provider]  Active Self, Pharmacy Records  albuterol  (VENTOLIN  HFA) 108 [phone]  Base) MCG/ACT inhaler [id number] Yes Inhale 2 puffs into the lungs every 6 (six) hours as needed for wheezing or shortness of breath. Daryl Setter, NP  Active   allopurinol  (ZYLOPRIM ) 100 MG tablet [id number] Yes Take 2 tablets (200 mg total) by mouth daily at 12 noon. Domenica Harlene LABOR, MD  Active   antiseptic oral rinse KEENAN) LIQD [id number]  15 mLs by Mouth Rinse route 2 (two) times daily. [provider]  Active Self, Pharmacy Records  atorvastatin  (LIPITOR) 10 MG tablet [id number] Yes Take 1 tablet (10 mg total) by mouth daily at 12 noon O'Sullivan, Melissa, NP  Active   benzonatate  (TESSALON ) 200 MG capsule [id number]  Take 1 capsule (200 mg total) by mouth 3 (three) times daily as needed for cough. Daryl Setter, NP  Active   diclofenac  Sodium (VOLTAREN ) 1 % GEL [id number]  Apply topically. [provider]  Active   docusate sodium  (COLACE) 100 MG capsule [id number]  Take 1 capsule (100 mg total) by mouth every 12 (twelve) hours.  Patient not taking: Reported on 09/09/2024   Mannie Pac T, DO  Active   hydrochlorothiazide  (HYDRODIURIL ) 25 MG tablet [id number] Yes Take 1 tablet (25 mg total) by mouth daily at 12 noon Daryl Setter, NP  Active   levothyroxine  (SYNTHROID ) 137 MCG tablet [id number] Yes Take 1 tablet (137 mcg total) by mouth daily before breakfast. Daryl Setter, NP  Active   loratadine  (CLARITIN ) 10 MG tablet [id number] Yes Take 1 tablet (10 mg total) by mouth every morning.  Patient taking differently: Take 10 mg by mouth daily as needed for allergies.   Daryl Setter, NP  Active   losartan  (COZAAR ) 25 MG tablet [id number] Yes Take 1 tablet (25 mg total) by mouth daily at 12 noon Daryl Setter, NP  Active   Magnesium  500 MG TABS [id number]  Take 500 mg by mouth daily. [provider]  Active Self, Pharmacy Records  Menthol , Topical Analgesic, (ICY HOT EX) [id number]  Apply 1 Application topically at bedtime. Lidocaine  / on  back [provider]  Active Self, Pharmacy Records  multivitamin-lutein  (OCUVITE-LUTEIN ) CAPS capsule [id number]  Take 1 capsule by mouth daily. [provider]  Active Self, Pharmacy Records  nitroGLYCERIN  (NITROSTAT ) 0.4 MG SL tablet [id number] Yes Place 1 tablet (0.4 mg total) under the tongue every 5 (five) minutes as needed for chest pain. Daryl Setter, NP  Active   omeprazole  (PRILOSEC) 20 MG capsule [id number] Yes Take 1 capsule (20 mg total) by mouth daily at 12 noon Daryl Setter, NP  Active   ondansetron  (ZOFRAN ) 4 MG tablet [id number]  Take 1 tablet (4 mg total) by mouth every 8 (eight) hours as needed for nausea or vomiting. Daryl Setter, NP  Active   OVER THE COUNTER MEDICATION [id number]  Place 1 spray into both nostrils 2 (two) times daily. Sinus saline  Patient taking differently: Place 1 spray into both nostrils 2 (two) times daily as needed. Sinus saline   [provider]  Active Self, Pharmacy Records  polyethylene glycol powder (GLYCOLAX /MIRALAX ) 17 GM/SCOOP powder [id number]  Take 17 g by mouth 2 (two) times daily as needed for mild constipation, moderate constipation or severe constipation. Desiderio Chew, PA-C  Active Self,  Pharmacy Records  polyvinyl alcohol  (ARTIFICIAL TEARS) 1.4 % ophthalmic solution [id number]  Place 1 drop into both eyes daily as needed for dry eyes. [provider]  Active Self, Pharmacy Records  Vibegron [facility]) 75 MG TABS [id number]  Take 75 mg by mouth daily.  Patient not taking: Reported on 09/09/2024   [provider]  Active               Assessment/Plan:   Hypertension: - Currently uncontrolled - Reviewed long term cardiovascular and renal outcomes of uncontrolled blood pressure - Recommend to continue losartan  25mg  daily and hydrochlorothiazide  25mg  once daily.   Medication Management: - Currently strategy sufficient to maintain appropriate adherence to prescribed medication  regimen - Continue adherence packs - updated address to change to his address at assisted living / [facility] 09/16/2024 - Coordinated with medication optimization team to determine when packs will run out.  - Son also asked about getting over-the-counter supplies like incontinence briefs and male external catheter supplies - checked with [facility] pharmacy and only able to get briefs.   Suggested packaging schedule:  6am pack Levothyroxine  137mcg  8am pack Allopurinol  100mg  - 2 tabs Hydrochlorothiazide  25mg   Atorvastatin  10mg  Losartan  25mg  Omeprazole  20mg   Madelin Ray, PharmD Clinical Pharmacist [facility]  Population Health [phone]      "

## 2024-09-16 ENCOUNTER — Other Ambulatory Visit: Payer: Self-pay

## 2024-09-17 ENCOUNTER — Other Ambulatory Visit (HOSPITAL_COMMUNITY): Payer: Self-pay

## 2024-09-17 ENCOUNTER — Telehealth: Payer: Self-pay | Admitting: Pharmacist

## 2024-09-17 ENCOUNTER — Other Ambulatory Visit: Payer: Self-pay

## 2024-09-17 NOTE — Telephone Encounter (Signed)
 Received message from [facility] pharmacy that they were working on [name] Pratt adherence packs. They wanted to confirm that he would be receiving by delivery. Confirmed with patient's son, [name] Pratt. Meds are to be delivered to [facility] - patient already has a room there but will officially move in 01/10/206.  Pharmacy also states that the manufacturer for his levothyroxine  changed for this month. OK's switch since levothyroxine  is a NPI medication. He is due to see PCP in February 2026 with plans to recheck TSH level.

## 2024-09-18 ENCOUNTER — Other Ambulatory Visit: Payer: Self-pay

## 2024-09-22 ENCOUNTER — Other Ambulatory Visit

## 2024-09-22 ENCOUNTER — Other Ambulatory Visit: Payer: Self-pay

## 2024-09-22 ENCOUNTER — Telehealth: Payer: Self-pay | Admitting: *Deleted

## 2024-09-22 ENCOUNTER — Other Ambulatory Visit (HOSPITAL_COMMUNITY): Payer: Self-pay

## 2024-09-22 NOTE — Telephone Encounter (Signed)
 Copied from CRM [id number]. Topic: General - Other >> Sep 19, 2024 12:28 PM Victoria A wrote: Reason for CRM: Renda from [facility] called and needs the following: Height and weight is needed; H&P; order for Acetaminophen  needs to be clarified ; Artifical Tears need clarification on usage; evothyroxine (SYNTHROID ) 137 MCG tablet needs to have once a day or how often; nitroGLYCERIN  (NITROSTAT ) 0.4 MG SL tablet needs how often and call or send to ER added. Renda can be reached at [phone] Fax [phone]

## 2024-09-22 NOTE — Telephone Encounter (Signed)
 Called but Renda was not in the office. Will call back

## 2024-09-22 NOTE — Telephone Encounter (Signed)
 Spoke to [facility] and she will fax a new form for dietary changes

## 2024-09-23 ENCOUNTER — Telehealth: Payer: Self-pay | Admitting: Pharmacist

## 2024-09-23 ENCOUNTER — Other Ambulatory Visit: Payer: Self-pay

## 2024-09-23 NOTE — Progress Notes (Signed)
 Patient has moved to [facility] assisted living. His son thought that he was going to be able to continue to use the adherence packs but [facility] is not able to use the adherence packs. Patient will either have to use their contracted pharmacy or meds have to be in bottles.  Called [place] [facility] - the packaging was delivered but they have not taken any meds from the box. Checked with Adherence team and patient can return the box to our the pharmacy.  Spoke with Renda [phone]. She needs Mr. Redmon son to return the pharmacy form and then she can order from their pharmacy - [facility]. They will delivery med to the facility by tomorrow morning if [name], son, returns form ASAP.  LM on son's phone about above and encouraged him to send in needed form.

## 2024-09-24 ENCOUNTER — Other Ambulatory Visit

## 2024-09-24 NOTE — Progress Notes (Signed)
 "  09/24/2024 Name: [name] Pratt MRN: [id number] DOB: 30-Mar-1941  Chief Complaint  Patient presents with   Medication Management    [name] Pratt is a 84 y.o. year old male. Completed phone visit with [facility] facility representative and his son [name] today.   They were referred to the pharmacist by their PCP for assistance in managing complex medication management.    Subjective: Patient has moved [facility] assisted living facility 09/20/2024. He was hoping to continue to use adherence packs from [facility] but per [facility] representative they are not able to use the pack due to state requirements. They have received the adherence box delivery but they have not used it. They were able to get meds this morning from their contracted pharmacy.    Care Team: Primary Care Provider: Daryl Setter, NP ; Next Scheduled Visit: 10/28/2024  Medication Access/Adherence  Current Pharmacy:  [facility] - [place], [place] - [address] [place] [address] Phone: [phone] Fax: [phone]  [facility] - [place] - [address] [facility] [address] [place] [address] Phone: [phone] Fax: [phone]  [facility] [place] - [facility] [address] [place] [address] Phone: [phone] Fax: [phone]   Patient reports affordability concerns with their medications: No    Patient reports access/transportation concerns to their pharmacy: No  Patient reports adherence concerns with their medications:  Yes  - son helps with medication administration   Hypertension:  Current medications: losartan  25mg  daily and hydrochlorothiazide  25mg  daily at noone   BP Readings from Last 3 Encounters:  08/26/24 (!) 154/73  07/27/24 (!) 183/83  07/22/24 (!) 158/87    Medications previously tried: amlodpine - stopped due to low BP  They will be  checking blood pressure in the facility  Identified barriers to adherence:  Multiple comorbidities Complex medication regimen Poor knowledge medication.  Current regimen per son:   Morning  Levothyroxine  137mcg  8am  Allopurinol  100mg  - take 2 tablets Hydrochlorothiazide  25mg  Atorvastatin  10mg  Losartan  25mg   Omeprazole  20mg   Bedtime Acetaminophen  500mg     Objective:  Lab Results  Component Value Date   HGBA1C 5.6 01/22/2023    Lab Results  Component Value Date   CREATININE 1.30 08/29/2024   BUN 31 (H) 08/29/2024   NA 137 08/29/2024   K 4.0 08/29/2024   CL 102 08/29/2024   CO2 27 08/29/2024    Lab Results  Component Value Date   CHOL 122 06/04/2024   HDL 49.80 06/04/2024   LDLCALC 52 06/04/2024   TRIG 101.0 06/04/2024   CHOLHDL 2 06/04/2024    Medications Reviewed Today     Reviewed by Carla Milling, RPH-CPP (Pharmacist) on 09/24/24 at 1233  Med List Status: <None>   Medication Order Taking? Sig Documenting Provider Last Dose Status Informant  acetaminophen  (TYLENOL ) 500 MG tablet [id number]  Take 500 mg by mouth at bedtime as needed for moderate pain (pain score 4-6). [provider]  Active Self, Pharmacy Records  albuterol  (VENTOLIN  HFA) 108 [phone] Base) MCG/ACT inhaler [id number]  Inhale 2 puffs into the lungs every 6 (six) hours as needed for wheezing or shortness of breath. Daryl Setter, NP  Active   allopurinol  (ZYLOPRIM ) 100 MG tablet [id number]  Take 2 tablets (200 mg total) by mouth daily at 12 noon. Domenica Harlene LABOR, MD  Active   antiseptic oral rinse (BIOTENE) LIQD [id number]  15 mLs by  Mouth Rinse route 2 (two) times daily. [provider]  Active Self, Pharmacy Records  atorvastatin  (LIPITOR) 10 MG tablet [id number]  Take 1 tablet (10 mg total) by mouth daily at 12 noon O'Sullivan, Melissa, NP  Active   benzonatate  (TESSALON ) 200 MG capsule [id number]  Take 1 capsule (200 mg total) by mouth 3 (three) times daily as needed for  cough. Daryl Setter, NP  Active   diclofenac  Sodium (VOLTAREN ) 1 % GEL [id number]  Apply topically. [provider]  Active   docusate sodium  (COLACE) 100 MG capsule [id number]  Take 1 capsule (100 mg total) by mouth every 12 (twelve) hours.  Patient not taking: Reported on 09/09/2024   Mannie Pac T, DO  Active   hydrochlorothiazide  (HYDRODIURIL ) 25 MG tablet [id number]  Take 1 tablet (25 mg total) by mouth daily at 12 noon Daryl Setter, NP  Active   levothyroxine  (SYNTHROID ) 137 MCG tablet [id number]  Take 1 tablet (137 mcg total) by mouth daily before breakfast. Daryl Setter, NP  Active   loratadine  (CLARITIN ) 10 MG tablet [id number]  Take 1 tablet (10 mg total) by mouth every morning.  Patient taking differently: Take 10 mg by mouth daily as needed for allergies.   Daryl Setter, NP  Active   losartan  (COZAAR ) 25 MG tablet [id number]  Take 1 tablet (25 mg total) by mouth daily at 12 noon Daryl Setter, NP  Active   Magnesium  500 MG TABS [id number]  Take 500 mg by mouth daily. [provider]  Active Self, Pharmacy Records  Menthol , Topical Analgesic, (ICY HOT EX) [id number]  Apply 1 Application topically at bedtime. Lidocaine  / on back [provider]  Active Self, Pharmacy Records  multivitamin-lutein  (OCUVITE-LUTEIN ) CAPS capsule [id number]  Take 1 capsule by mouth daily. [provider]  Active Self, Pharmacy Records  nitroGLYCERIN  (NITROSTAT ) 0.4 MG SL tablet [id number]  Place 1 tablet (0.4 mg total) under the tongue every 5 (five) minutes as needed for chest pain. O'Sullivan, Melissa, NP  Active   omeprazole  (PRILOSEC) 20 MG capsule [id number]  Take 1 capsule (20 mg total) by mouth daily at 12 noon Daryl Setter, NP  Active   ondansetron  (ZOFRAN ) 4 MG tablet [id number]  Take 1 tablet (4 mg total) by mouth every 8 (eight) hours as needed for nausea or vomiting. Daryl Setter, NP  Active   OVER THE COUNTER MEDICATION  [id number]  Place 1 spray into both nostrils 2 (two) times daily. Sinus saline  Patient taking differently: Place 1 spray into both nostrils 2 (two) times daily as needed. Sinus saline   [provider]  Active Self, Pharmacy Records  polyethylene glycol powder (GLYCOLAX /MIRALAX ) 17 GM/SCOOP powder [id number]  Take 17 g by mouth 2 (two) times daily as needed for mild constipation, moderate constipation or severe constipation. Desiderio Chew, PA-C  Active Self, Pharmacy Records  polyvinyl alcohol  (ARTIFICIAL TEARS) 1.4 % ophthalmic solution [id number]  Place 1 drop into both eyes daily as needed for dry eyes. [provider]  Active Self, Pharmacy Records  Vibegron [facility]) 75 MG TABS [id number]  Take 75 mg by mouth daily.  Patient not taking: Reported on 09/09/2024   [provider]  Active               Assessment/Plan:   Hypertension: - Currently uncontrolled - Reviewed long term cardiovascular and renal outcomes of uncontrolled blood pressure - Recommend to continue losartan  25mg  daily and hydrochlorothiazide  25mg  once daily.   Medication Management: - Currently strategy sufficient  to maintain appropriate adherence to prescribed medication regimen - [name] Pratt will return adherence packs to me so that I can return to [facility] pharmacy.   [name] Pratt, PharmD Clinical Pharmacist [facility] Primary Care  Population Health [phone]      "

## 2024-09-24 NOTE — Telephone Encounter (Signed)
 Form received,  will be completed and faxed back today

## 2024-09-30 ENCOUNTER — Emergency Department (HOSPITAL_COMMUNITY)

## 2024-09-30 ENCOUNTER — Ambulatory Visit: Payer: Self-pay | Admitting: *Deleted

## 2024-09-30 ENCOUNTER — Other Ambulatory Visit: Payer: Self-pay

## 2024-09-30 ENCOUNTER — Emergency Department (HOSPITAL_COMMUNITY)
Admission: EM | Admit: 2024-09-30 | Discharge: 2024-09-30 | Disposition: A | Discharge status: Home / Self Care | Source: Skilled Nursing Facility | Attending: Emergency Medicine | Admitting: Emergency Medicine

## 2024-09-30 DIAGNOSIS — M25552 Pain in left hip: Secondary | ICD-10-CM | POA: Diagnosis not present

## 2024-09-30 DIAGNOSIS — M549 Dorsalgia, unspecified: Secondary | ICD-10-CM | POA: Diagnosis not present

## 2024-09-30 DIAGNOSIS — Y92121 Bathroom in nursing home as the place of occurrence of the external cause: Secondary | ICD-10-CM | POA: Insufficient documentation

## 2024-09-30 DIAGNOSIS — Z79899 Other long term (current) drug therapy: Secondary | ICD-10-CM | POA: Diagnosis not present

## 2024-09-30 DIAGNOSIS — W19XXXA Unspecified fall, initial encounter: Secondary | ICD-10-CM

## 2024-09-30 DIAGNOSIS — I6782 Cerebral ischemia: Secondary | ICD-10-CM | POA: Insufficient documentation

## 2024-09-30 DIAGNOSIS — M25551 Pain in right hip: Secondary | ICD-10-CM | POA: Diagnosis not present

## 2024-09-30 DIAGNOSIS — Z8673 Personal history of transient ischemic attack (TIA), and cerebral infarction without residual deficits: Secondary | ICD-10-CM | POA: Diagnosis not present

## 2024-09-30 DIAGNOSIS — M47812 Spondylosis without myelopathy or radiculopathy, cervical region: Secondary | ICD-10-CM | POA: Diagnosis not present

## 2024-09-30 DIAGNOSIS — M25512 Pain in left shoulder: Secondary | ICD-10-CM | POA: Diagnosis present

## 2024-09-30 DIAGNOSIS — W01198A Fall on same level from slipping, tripping and stumbling with subsequent striking against other object, initial encounter: Secondary | ICD-10-CM | POA: Diagnosis not present

## 2024-09-30 NOTE — Telephone Encounter (Signed)
 I am unable to order a mobile x-ray. If we have openings this afternoon he can come in for an appointment.  If not, then I recommend ED.

## 2024-09-30 NOTE — ED Provider Triage Note (Signed)
 Emergency Medicine Provider Triage Evaluation Note  [name] Pratt , a 84 y.o. male  was evaluated in triage.  Pt complains of fall 2 days ago. Mechanical Patient was urinating, lost his footing, fell backward and hit a wall striking his head. C/o bl LB/ Hip pain. Ambulatory .  Review of Systems  Positive: Back pain  Negative: Weakness   Physical Exam  BP (!) 149/78 (BP Location: Left Arm)   Pulse 66   Temp 97.8 F (36.6 C) (Oral)   Resp 18   SpO2 100%  Gen:   Awake, no distress   Resp:  Normal effort  MSK:   Moves extremities without difficulty  Other:    Medical Decision Making  Medically screening exam initiated at 1:37 PM.  Appropriate orders placed.  [name] Pratt was informed that the remainder of the evaluation will be completed by another provider, this initial triage assessment does not replace that evaluation, and the importance of remaining in the ED until their evaluation is complete.  No thinners    [name] Chroman, PA-C 09/30/24 1339

## 2024-09-30 NOTE — ED Triage Notes (Signed)
 PT arrives to triage via EMS from [facility]. PT had a fall on Sunday, refused transport. Pt reporting persistent bilateral hip, and lower back pain. Pt was sent for XRAYS and pain medication. PT is ambulatory with use of walker.

## 2024-09-30 NOTE — ED Provider Notes (Signed)
 " [place] EMERGENCY DEPARTMENT AT [facility] Provider Note   CSN: [id number] Arrival date & time: 09/30/24  1321     Patient presents with: Hip Pain and Back Pain   [name] Pratt is a 84 y.o. male.   84 year old male presenting after a fall.  Patient is coming from assisted living facility, notes that he was using the bathroom on Sunday when he was pulling up his pants he leaned too far backwards causing him to fall and strike his head against the wall, he did not lose consciousness when this occurred, he is not on a blood thinner.  At that time, he refused transport to the emergency department.  He notes soreness in his hips bilaterally like a bruise as well as pain in his left shoulder.  Denies any worsening numbness/tingling/weakness in his legs or arms bilaterally, no vision changes.   Hip Pain  Back Pain      Prior to Admission medications  Medication Sig Start Date End Date Taking? Authorizing Provider  acetaminophen  (TYLENOL ) 500 MG tablet Take 500 mg by mouth at bedtime as needed for moderate pain (pain score 4-6).    [provider]  albuterol  (VENTOLIN  HFA) 108 (90 Base) MCG/ACT inhaler Inhale 2 puffs into the lungs every 6 (six) hours as needed for wheezing or shortness of breath. 06/02/24   O'Sullivan, Melissa, NP  allopurinol  (ZYLOPRIM ) 100 MG tablet Take 2 tablets (200 mg total) by mouth daily at 12 noon. 08/22/24   Domenica Harlene LABOR, MD  antiseptic oral rinse (BIOTENE) LIQD 15 mLs by Mouth Rinse route 2 (two) times daily.    [provider]  atorvastatin  (LIPITOR) 10 MG tablet Take 1 tablet (10 mg total) by mouth daily at 12 noon 08/22/24   O'Sullivan, Melissa, NP  benzonatate  (TESSALON ) 200 MG capsule Take 1 capsule (200 mg total) by mouth 3 (three) times daily as needed for cough. 06/02/24   O'Sullivan, Melissa, NP  diclofenac  Sodium (VOLTAREN ) 1 % GEL Apply topically. 07/21/22   [provider]  docusate sodium  (COLACE) 100 MG  capsule Take 1 capsule (100 mg total) by mouth every 12 (twelve) hours. Patient not taking: Reported on 09/09/2024 05/24/24   Mannie Pac T, DO  hydrochlorothiazide  (HYDRODIURIL ) 25 MG tablet Take 1 tablet (25 mg total) by mouth daily at 12 noon 08/22/24   O'Sullivan, Melissa, NP  levothyroxine  (SYNTHROID ) 137 MCG tablet Take 1 tablet (137 mcg total) by mouth daily before breakfast. 09/01/24   O'Sullivan, Melissa, NP  loratadine  (CLARITIN ) 10 MG tablet Take 1 tablet (10 mg total) by mouth every morning. Patient taking differently: Take 10 mg by mouth daily as needed for allergies. 08/05/24   O'Sullivan, Melissa, NP  losartan  (COZAAR ) 25 MG tablet Take 1 tablet (25 mg total) by mouth daily at 12 noon 08/22/24   O'Sullivan, Melissa, NP  Magnesium  500 MG TABS Take 500 mg by mouth daily.    [provider]  Menthol , Topical Analgesic, (ICY HOT EX) Apply 1 Application topically at bedtime. Lidocaine  / on back    [provider]  multivitamin-lutein  (OCUVITE-LUTEIN ) CAPS capsule Take 1 capsule by mouth daily.    [provider]  nitroGLYCERIN  (NITROSTAT ) 0.4 MG SL tablet Place 1 tablet (0.4 mg total) under the tongue every 5 (five) minutes as needed for chest pain. 06/02/24   O'Sullivan, Melissa, NP  omeprazole  (PRILOSEC) 20 MG capsule Take 1 capsule (20 mg total) by mouth daily at 12 noon 08/22/24   Daryl,  Melissa, NP  ondansetron  (ZOFRAN ) 4 MG tablet Take 1 tablet (4 mg total) by mouth every 8 (eight) hours as needed for nausea or vomiting. 06/02/24   Daryl Setter, NP  OVER THE COUNTER MEDICATION Place 1 spray into both nostrils 2 (two) times daily. Sinus saline Patient taking differently: Place 1 spray into both nostrils 2 (two) times daily as needed. Sinus saline    [provider]  polyethylene glycol powder (GLYCOLAX /MIRALAX ) 17 GM/SCOOP powder Take 17 g by mouth 2 (two) times daily as needed for mild constipation, moderate constipation or severe  constipation. 12/30/22   Geiple, Joshua, PA-C  polyvinyl alcohol  (ARTIFICIAL TEARS) 1.4 % ophthalmic solution Place 1 drop into both eyes daily as needed for dry eyes.    [provider]  Vibegron (GEMTESA) 75 MG TABS Take 75 mg by mouth daily. Patient not taking: Reported on 09/09/2024    [provider]    Allergies: Pravastatin , Sildenafil , Oxycodone , Atenolol, and Metoclopramide hcl    Review of Systems  Musculoskeletal:  Positive for back pain.    Updated Vital Signs  Vitals:   09/30/24 1332 09/30/24 1335  BP: (!) 149/78   Pulse: 66   Resp: 18   Temp:  97.8 F (36.6 C)  TempSrc:  Oral  SpO2: 100%      Physical Exam Vitals and nursing note reviewed.  Constitutional:      Appearance: Normal appearance.  HENT:     Head: Normocephalic and atraumatic.     Comments: No Battle sign or raccoon eyes Eyes:     Extraocular Movements: Extraocular movements intact.     Pupils: Pupils are equal, round, and reactive to light.  Cardiovascular:     Rate and Rhythm: Normal rate.  Pulmonary:     Effort: Pulmonary effort is normal.  Musculoskeletal:     Cervical back: Normal range of motion.     Comments: Hip/pelvis: Mild tenderness to palpation in the hips bilaterally, no appreciable bony deformity Back: Prominent spinous processes, no tenderness, no step-off Left shoulder: No appreciable bony deformity, mild tenderness in the region of the anterior shoulder, pain elicited with cross body reach Moves all extremities spontaneously without difficulty  Skin:    General: Skin is warm and dry.  Neurological:     General: No focal deficit present.     Mental Status: He is alert and oriented to person, place, and time.     Comments: Facial expressions are symmetric and intact without evidence of facial droop     (all labs ordered are listed, but only abnormal results are displayed) Labs Reviewed - No data to display  EKG: None  Radiology: DG Hip Unilat W or Wo  Pelvis 2-3 Views Left Result Date: 09/30/2024 CLINICAL DATA:  Fall. EXAM: DG HIP (WITH OR WITHOUT PELVIS) 2-3V LEFT; DG HIP (WITH OR WITHOUT PELVIS) 2-3V RIGHT COMPARISON:  Radiograph dated 02/22/2024. 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 The arthroplasty components appear intact and in anatomic alignment. There is no acute fracture or dislocation. The bones are osteopenic. The soft tissues are unremarkable. IMPRESSION: 1. No acute fracture or dislocation. 2. Bilateral total hip arthroplasties. Electronically Signed   By: Vanetta Chou M.D.   On: 09/30/2024 14:12   DG Hip Unilat W or Wo Pelvis 2-3 Views Right Result Date: 09/30/2024 CLINICAL DATA:  Fall. EXAM: DG HIP (WITH OR WITHOUT PELVIS) 2-3V LEFT; DG HIP (WITH OR WITHOUT PELVIS) 2-3V RIGHT COMPARISON:  Radiograph dated 02/22/2024. FINDINGS: There are bilateral total  hip arthroplasties. The arthroplasty components appear intact and in anatomic alignment. There is no acute fracture or dislocation. The bones are osteopenic. The soft tissues are unremarkable. IMPRESSION: 1. No acute fracture or dislocation. 2. Bilateral total hip arthroplasties. Electronically Signed   By: Vanetta Chou M.D.   On: 09/30/2024 14:12     Procedures   Medications Ordered in the ED - No data to display                                  Medical Decision Making This patient presents to the ED for concern of fall, this involves an extensive number of treatment options, and is a complaint that carries with it a high risk of complications and morbidity.  The differential diagnosis includes fracture, dislocation, contusion, concussion, intracranial hemorrhage   Co morbidities that complicate the patient evaluation  Degenerative disc disease, bilateral hip replacements   Additional history obtained:  Additional history obtained from record review External records from outside source obtained and reviewed including prior ED visit  note   Imaging Studies ordered:  I ordered imaging studies including XR bilateral hips + pelvis, XR L shoulder, CT head, CT C-spine, CT L-spine I independently visualized and interpreted imaging which showed  - XR R hip/pelvis and XR L hip/pelvis: 1. No acute fracture or dislocation. 2. Bilateral total hip arthroplasties. - XR L shoulder: No acute osseous abnormality. Moderate degenerative changes. - CT head and CT C-spine: 1. No acute brain injury. 2. 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 3. No acute cervical spine injury. 4. Moderate spondylosis throughout the cervical spine with multilevel disc disease and neural foraminal narrowing as described. Anterior and posterior fusion hardware intact. - CT L-spine: 1. No evidence of acute traumatic injury. 2. Advanced multilevel spondylosis similar to prior.  I agree with the radiologist interpretation   Cardiac Monitoring: / EKG:  The patient was maintained on a cardiac monitor.  I personally viewed and interpreted the cardiac monitored which showed an underlying rhythm of: NSR   Problem List / ED Course / Critical interventions / Medication management  I have reviewed the patients home medicines and have made adjustments as needed   Social Determinants of Health:  Former tobacco use   Test / Admission - Considered:  Physical exam is largely reassuring as above, patient does demonstrate some discomfort with cross body reach at the left shoulder as well as mild tenderness in the hips bilaterally, no signs of basilar skull fracture, patient is alert and oriented and able to answer my questions appropriately, he is able to ambulate and bear weight fully. CT and x-ray imaging is largely reassuring as above, no evidence of any acute injuries as a result of his recent fall that occurred on Sunday.  I recommend that he continue Tylenol  as needed for pain.  Patient voiced understanding and is  in agreement with this plan, return precautions discussed, he is appropriate for discharge at this time.  Staffed with Dr. Darra   Amount and/or Complexity of Data Reviewed Radiology: ordered.        Final diagnoses:  Fall, initial encounter    ED Discharge Orders     None          Glendia Rocky SAILOR, [place] 09/30/24 1547  "

## 2024-09-30 NOTE — Telephone Encounter (Signed)
 FYI Only or Action Required?: FYI only for provider: ED advised and patient declined .  Patient was last seen in primary care on 08/26/2024 by Daryl Setter, NP.  Called Nurse Triage reporting Hip Pain.  Symptoms began several days ago.  Interventions attempted: Rest, hydration, or home remedies.  Symptoms are: rapidly worsening.  Triage Disposition: Go to ED Now (or PCP Triage)  Patient/caregiver understands and will follow disposition?: No, wishes to speak with PCP   Caregiver TJ requesting if patient can take tylenol  or be prescribed tylenol  and voltaren  gel for right hip pain and left shoulder pain. Recommended ED and patient has refused 09/28/24 and caregiver reports patient will not go. Requesting if mobile xray is an option to order.   Please advise if call back needed at [facility]- Renda Pacini DON. Telephone- # [phone] and fax # [phone].       CAL Jannis, notified patient declined ED.    Reason for Disposition  Patient sounds very sick or weak to the triager  Answer Assessment - Initial Assessment Questions Recommended f/u ED. Patient's caregiver TJ reports facility called EMS on 09/28/24 and patient declined to go to hospital for evaluation. Reports patient fell coming out of bathroom and fell backwards. Now severe pain right hip , difficulty walking, standing and sitting. Requires assistance to move. Care give requesting if patient can be given tylenol  or ordered voltaren  gel. Requesting if mobile x ray can be ordered.  CAL notified patient declined ED again.      1. LOCATION and RADIATION: Where is the pain located? Does the pain spread (shoot) anywhere else?     Right hip pain low back  and left shoulder per TJ caregiver at [facility]. 2. QUALITY: What does the pain feel like?  (e.g., sharp, dull, aching, burning)     Does not report 3. SEVERITY: How bad is the pain? What does it keep you from doing?    (Scale 1-10; or mild, moderate, severe)     Severe pain,  difficulty sitting up in wheelchair  4. ONSET: When did the pain start? Does it come and go, or is it there all the time?     Constant  5. WORK OR EXERCISE: Has there been any recent work or exercise that involved this part of the body?      Patient fell x 1 1/18 and 09/29/24. 6. CAUSE: What do you think is causing the hip pain?      fall 7. AGGRAVATING FACTORS: What makes the hip pain worse? (e.g., walking, climbing stairs, running)     Movement, standing sitting , needs assist with walking  8. OTHER SYMPTOMS: Do you have any other symptoms? (e.g., back pain, pain shooting down leg,  fever, rash)     Fell hitting head  1/18 or 09/29/24 when coming out of bathroom .falling backwards. C/o left shoulder pain and right hip pain.  Denies chest pain no difficulty breathing no bleeding no blurred vision no head pain reported.  Protocols used: Hip Pain-A-AH

## 2024-09-30 NOTE — Telephone Encounter (Signed)
 Called [facility] and notified the of providers recommendations. They will try to take patient to ED

## 2024-09-30 NOTE — Discharge Instructions (Signed)
 Continue Tylenol  as needed for pain.  Follow-up with your primary care provider as needed.  Return to the emergency department if your symptoms worsen.

## 2024-10-10 ENCOUNTER — Other Ambulatory Visit: Payer: Self-pay | Admitting: Pharmacist

## 2024-10-10 ENCOUNTER — Other Ambulatory Visit: Payer: Self-pay

## 2024-10-10 MED ORDER — POLYVINYL ALCOHOL 1.4 % OP SOLN: 1.0000 [drp] | Freq: Two times a day (BID) | OPHTHALMIC | 1 refills | Status: AC | PRN

## 2024-10-10 MED ORDER — ICY HOT MAX LIDOCAINE 4 % EX LIQD: CUTANEOUS | 1 refills | Status: AC

## 2024-10-10 MED ORDER — ACETAMINOPHEN 500 MG PO TABS: 1000.0000 mg | ORAL_TABLET | ORAL | 0 refills | Status: AC

## 2024-10-10 MED ORDER — DOCUSATE SODIUM 100 MG PO CAPS: 100.0000 mg | ORAL_CAPSULE | Freq: Two times a day (BID) | ORAL | 0 refills | Status: AC | PRN

## 2024-10-10 MED ORDER — BIOTENE DRY MOUTH MT LIQD: 15.0000 mL | Freq: Two times a day (BID) | OROMUCOSAL | 1 refills | Status: AC

## 2024-10-10 MED ORDER — POLYETHYLENE GLYCOL 3350 17 GM/SCOOP PO POWD: 17.0000 g | Freq: Every day | ORAL | 0 refills | Status: AC | PRN

## 2024-10-10 MED ORDER — DICLOFENAC SODIUM 1 % EX GEL: 2.0000 g | Freq: Two times a day (BID) | CUTANEOUS | 0 refills | Status: AC | PRN

## 2024-10-10 NOTE — Telephone Encounter (Signed)
 Patient has moved to assisted living facility at [place]. His son calls today and states that they need prescritpions for all his over-the-counter medications with directions or the facility is not about to provide or administer medications.  Rx's sent to PCP to review and sign if approved.   His son is also asking about referral for physical therapy and occupational therapy to be completed at [facility]. Reports he had asked about PT and OT when patient was admitted to [facility].  Will forward request to PCP.   [facility]- Renda Gail read of nursing. Telephone- # [phone] and fax # [phone].

## 2024-10-13 ENCOUNTER — Encounter (HOSPITAL_COMMUNITY): Payer: Self-pay

## 2024-10-13 ENCOUNTER — Other Ambulatory Visit: Payer: Self-pay

## 2024-10-13 ENCOUNTER — Emergency Department (HOSPITAL_COMMUNITY)
Admission: EM | Admit: 2024-10-13 | Discharge: 2024-10-14 | Disposition: A | Attending: Emergency Medicine | Admitting: Emergency Medicine

## 2024-10-13 ENCOUNTER — Emergency Department (HOSPITAL_COMMUNITY)

## 2024-10-13 DIAGNOSIS — E860 Dehydration: Secondary | ICD-10-CM | POA: Insufficient documentation

## 2024-10-13 DIAGNOSIS — W19XXXA Unspecified fall, initial encounter: Secondary | ICD-10-CM

## 2024-10-13 DIAGNOSIS — W050XXA Fall from non-moving wheelchair, initial encounter: Secondary | ICD-10-CM | POA: Insufficient documentation

## 2024-10-13 DIAGNOSIS — S0003XA Contusion of scalp, initial encounter: Secondary | ICD-10-CM | POA: Insufficient documentation

## 2024-10-13 DIAGNOSIS — N3090 Cystitis, unspecified without hematuria: Secondary | ICD-10-CM | POA: Insufficient documentation

## 2024-10-13 DIAGNOSIS — M25551 Pain in right hip: Secondary | ICD-10-CM | POA: Insufficient documentation

## 2024-10-13 NOTE — ED Triage Notes (Signed)
 BIBA from [facility], unwitnessed fall out of wheel chair.  He was getting out of the chair to use the bathroom got dizzy and fell.  PT says dizzyness is normal.  Hit his head and has a minor abrasion to his fore head.  Denies any pain. EMS cleared C-Spine.  No thinners.  PT and ems didn't want to transport, POA wanted him to come.  AOX2 at baseline   130/82 68 HR  18 RR 96% RA 138 cbg

## 2024-10-14 ENCOUNTER — Other Ambulatory Visit: Payer: Self-pay

## 2024-10-14 LAB — CBC WITH DIFFERENTIAL/PLATELET
Abs Immature Granulocytes: 0.05 10*3/uL (ref 0.00–0.07)
Basophils Absolute: 0.1 10*3/uL (ref 0.0–0.1)
Basophils Relative: 1 %
Eosinophils Absolute: 0.4 10*3/uL (ref 0.0–0.5)
Eosinophils Relative: 3 %
HCT: 39.5 % (ref 39.0–52.0)
Hemoglobin: 12.5 g/dL — ABNORMAL LOW (ref 13.0–17.0)
Immature Granulocytes: 0 %
Lymphocytes Relative: 15 %
Lymphs Abs: 1.7 10*3/uL (ref 0.7–4.0)
MCH: 31.3 pg (ref 26.0–34.0)
MCHC: 31.6 g/dL (ref 30.0–36.0)
MCV: 98.8 fL (ref 80.0–100.0)
Monocytes Absolute: 1 10*3/uL (ref 0.1–1.0)
Monocytes Relative: 9 %
Neutro Abs: 8.5 10*3/uL — ABNORMAL HIGH (ref 1.7–7.7)
Neutrophils Relative %: 72 %
Platelets: 442 10*3/uL — ABNORMAL HIGH (ref 150–400)
RBC: 4 MIL/uL — ABNORMAL LOW (ref 4.22–5.81)
RDW: 13.2 % (ref 11.5–15.5)
WBC: 11.7 10*3/uL — ABNORMAL HIGH (ref 4.0–10.5)
nRBC: 0 % (ref 0.0–0.2)

## 2024-10-14 LAB — COMPREHENSIVE METABOLIC PANEL WITH GFR
ALT: 11 U/L (ref 0–44)
AST: 22 U/L (ref 15–41)
Albumin: 4 g/dL (ref 3.5–5.0)
Alkaline Phosphatase: 157 U/L — ABNORMAL HIGH (ref 38–126)
Anion gap: 10 (ref 5–15)
BUN: 38 mg/dL — ABNORMAL HIGH (ref 8–23)
CO2: 28 mmol/L (ref 22–32)
Calcium: 9.5 mg/dL (ref 8.9–10.3)
Chloride: 106 mmol/L (ref 98–111)
Creatinine, Ser: 1.44 mg/dL — ABNORMAL HIGH (ref 0.61–1.24)
GFR, Estimated: 48 mL/min — ABNORMAL LOW
Glucose, Bld: 112 mg/dL — ABNORMAL HIGH (ref 70–99)
Potassium: 4.6 mmol/L (ref 3.5–5.1)
Sodium: 144 mmol/L (ref 135–145)
Total Bilirubin: 0.3 mg/dL (ref 0.0–1.2)
Total Protein: 7.8 g/dL (ref 6.5–8.1)

## 2024-10-14 LAB — URINALYSIS, ROUTINE W REFLEX MICROSCOPIC
Bilirubin Urine: NEGATIVE
Glucose, UA: NEGATIVE mg/dL
Ketones, ur: NEGATIVE mg/dL
Nitrite: NEGATIVE
Protein, ur: NEGATIVE mg/dL
Specific Gravity, Urine: 1.015 (ref 1.005–1.030)
WBC, UA: >50 WBC/hpf (ref 0–5)
pH: 5 (ref 5.0–8.0)

## 2024-10-14 MED ORDER — CEPHALEXIN 500 MG PO CAPS: 500.0000 mg | ORAL_CAPSULE | Freq: Two times a day (BID) | ORAL | 0 refills | Status: AC

## 2024-10-14 MED ORDER — CEPHALEXIN 500 MG PO CAPS
500.0000 mg | ORAL_CAPSULE | Freq: Once | ORAL | Status: DC
  Filled 2024-10-14: qty 1

## 2024-10-14 MED ORDER — SODIUM CHLORIDE 0.9 % IV BOLUS
250.0000 mL | Freq: Once | INTRAVENOUS | Status: AC
  Administered 2024-10-14: 250 mL via INTRAVENOUS

## 2024-10-14 NOTE — ED Notes (Signed)
Called [facility] for transport.  

## 2024-10-16 LAB — URINE CULTURE: Culture: >=100000 — AB

## 2024-10-17 ENCOUNTER — Telehealth (HOSPITAL_BASED_OUTPATIENT_CLINIC_OR_DEPARTMENT_OTHER): Payer: Self-pay

## 2024-10-17 ENCOUNTER — Other Ambulatory Visit: Payer: Self-pay

## 2024-10-17 NOTE — Telephone Encounter (Signed)
 Post ED Visit - Positive Culture Follow-up  Culture report reviewed by antimicrobial stewardship pharmacist: [facility] []  Rankin Dee, Pharm.D. []  Venetia Gully, Pharm.D., BCPS AQ-ID []  Garrel Crews, Pharm.D., BCPS []  Almarie Lunger, Pharm.D., BCPS []  [place], [address].D., BCPS, AAHIVP []  Rosaline Bihari, Pharm.D., BCPS, AAHIVP []  Vernell Meier, PharmD, BCPS []  Latanya Hint, PharmD, BCPS []  Donald Medley, PharmD, BCPS []  Rocky Bold, PharmD []  Dorothyann Alert, PharmD, BCPS []  Morene Babe, PharmD  [facility] [x]  Damien Quiet, PharmD []  Romona Bliss, PharmD []  Dolphus Roller, PharmD []  Veva Seip, Rph []  Vernell Daunt) Leonce, PharmD []  Eva Allis, PharmD []  Rosaline Millet, PharmD []  Iantha Batch, PharmD []  Arvin Gauss, PharmD []  Wanda Hasting, PharmD []  Ronal Rav, PharmD []  Rocky Slade, PharmD []  Bard Jeans, PharmD   Positive urine culture  84 year old male with unwitnessed fall from wheelchair. Noted dizziness with positional head changes. No urinary symptoms. No changes needed.    Reviewed by ED provider:  Rocky Hamilton, PA-C      Shigeo Baugh, Camelia Elbe 10/17/2024, 12:56 PM

## 2024-10-17 NOTE — Progress Notes (Signed)
 ED Antimicrobial Stewardship Positive Culture Follow Up   [name] Pratt is an 84 y.o. male who presented to [facility] on 10/13/2024 with a chief complaint of  Chief Complaint  Patient presents with   Fall    Recent Results (from the past 720 hours)  Urine Culture     Status: Abnormal   Collection Time: 10/14/24  1:42 AM   Specimen: Urine, Clean Catch  Result Value Ref Range Status   Specimen Description   Final    URINE, CLEAN CATCH Performed at [facility], [address], [place], [place] [address]    Special Requests   Final    NONE Performed at [facility], [address] [place], [place] [address]    Culture (A)  Final    >=100,000 COLONIES/mL ENTEROCOCCUS FAECALIS VANCOMYCIN  RESISTANT ENTEROCOCCUS ISOLATED    Report Status 10/16/2024 FINAL  Final   Organism ID, Bacteria ENTEROCOCCUS FAECALIS (A)  Final      Susceptibility   Enterococcus faecalis - MIC*    AMPICILLIN  <=2 SENSITIVE Sensitive     NITROFURANTOIN <=16 SENSITIVE Sensitive     VANCOMYCIN  >=32 RESISTANT Resistant     LINEZOLID 2 SENSITIVE Sensitive     * >=100,000 COLONIES/mL ENTEROCOCCUS FAECALIS   84 year old male with unwitnessed fall from wheelchair. Noted dizziness with positional head changes. No urinary symptoms. No changes needed.   ED Provider: Rocky Hamilton, PA-C    Damien Quiet, PharmD, BCIDP Infectious Diseases Clinical Pharmacist Phone: [phone] 10/17/2024, 9:55 AM

## 2024-10-28 ENCOUNTER — Ambulatory Visit: Admitting: Family
